# Patient Record
Sex: Male | Born: 1940 | Race: Black or African American | Hispanic: No | Marital: Married | Smoking: Former smoker
Health system: Southern US, Community
[De-identification: ages and names within clinical notes are randomized; demographics above are authoritative.]

## PROBLEM LIST (undated history)

## (undated) DIAGNOSIS — I10 Essential (primary) hypertension: Secondary | ICD-10-CM

## (undated) DIAGNOSIS — Z992 Dependence on renal dialysis: Secondary | ICD-10-CM

## (undated) DIAGNOSIS — J189 Pneumonia, unspecified organism: Secondary | ICD-10-CM

## (undated) DIAGNOSIS — N186 End stage renal disease: Secondary | ICD-10-CM

## (undated) DIAGNOSIS — T7840XA Allergy, unspecified, initial encounter: Secondary | ICD-10-CM

## (undated) DIAGNOSIS — R7989 Other specified abnormal findings of blood chemistry: Secondary | ICD-10-CM

## (undated) DIAGNOSIS — K449 Diaphragmatic hernia without obstruction or gangrene: Secondary | ICD-10-CM

## (undated) DIAGNOSIS — H269 Unspecified cataract: Secondary | ICD-10-CM

## (undated) DIAGNOSIS — J302 Other seasonal allergic rhinitis: Secondary | ICD-10-CM

## (undated) DIAGNOSIS — M1990 Unspecified osteoarthritis, unspecified site: Secondary | ICD-10-CM

## (undated) DIAGNOSIS — K219 Gastro-esophageal reflux disease without esophagitis: Secondary | ICD-10-CM

## (undated) DIAGNOSIS — I2510 Atherosclerotic heart disease of native coronary artery without angina pectoris: Secondary | ICD-10-CM

## (undated) DIAGNOSIS — N529 Male erectile dysfunction, unspecified: Secondary | ICD-10-CM

## (undated) DIAGNOSIS — E039 Hypothyroidism, unspecified: Secondary | ICD-10-CM

## (undated) DIAGNOSIS — I739 Peripheral vascular disease, unspecified: Secondary | ICD-10-CM

## (undated) DIAGNOSIS — G4730 Sleep apnea, unspecified: Secondary | ICD-10-CM

## (undated) DIAGNOSIS — N289 Disorder of kidney and ureter, unspecified: Secondary | ICD-10-CM

## (undated) DIAGNOSIS — D649 Anemia, unspecified: Secondary | ICD-10-CM

## (undated) DIAGNOSIS — E785 Hyperlipidemia, unspecified: Secondary | ICD-10-CM

## (undated) DIAGNOSIS — E7211 Homocystinuria: Secondary | ICD-10-CM

## (undated) DIAGNOSIS — K5792 Diverticulitis of intestine, part unspecified, without perforation or abscess without bleeding: Secondary | ICD-10-CM

## (undated) DIAGNOSIS — F0390 Unspecified dementia without behavioral disturbance: Secondary | ICD-10-CM

## (undated) DIAGNOSIS — M109 Gout, unspecified: Secondary | ICD-10-CM

## (undated) HISTORY — DX: Peripheral vascular disease, unspecified: I73.9

## (undated) HISTORY — DX: Disorder of kidney and ureter, unspecified: N28.9

## (undated) HISTORY — DX: Allergy, unspecified, initial encounter: T78.40XA

## (undated) HISTORY — DX: Essential (primary) hypertension: I10

## (undated) HISTORY — PX: OTHER SURGICAL HISTORY: SHX169

## (undated) HISTORY — DX: Other specified abnormal findings of blood chemistry: R79.89

## (undated) HISTORY — PX: BREAST SURGERY: SHX581

## (undated) HISTORY — DX: Unspecified cataract: H26.9

## (undated) HISTORY — DX: Homocystinuria: E72.11

## (undated) HISTORY — PX: COLONOSCOPY: SHX174

## (undated) HISTORY — DX: Male erectile dysfunction, unspecified: N52.9

## (undated) HISTORY — DX: Hyperlipidemia, unspecified: E78.5

## (undated) HISTORY — PX: EYE SURGERY: SHX253

---

## 1990-09-24 HISTORY — PX: 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 (ARMC HX): HXRAD1269

## 1999-05-03 ENCOUNTER — Ambulatory Visit (HOSPITAL_COMMUNITY): Admission: RE | Admit: 1999-05-03 | Discharge: 1999-05-03 | Payer: Self-pay | Admitting: Nephrology

## 1999-05-03 ENCOUNTER — Encounter: Payer: Self-pay | Admitting: Nephrology

## 2003-06-08 ENCOUNTER — Encounter: Payer: Self-pay | Admitting: Gastroenterology

## 2003-06-08 DIAGNOSIS — K5730 Diverticulosis of large intestine without perforation or abscess without bleeding: Secondary | ICD-10-CM | POA: Insufficient documentation

## 2003-12-06 ENCOUNTER — Ambulatory Visit (HOSPITAL_COMMUNITY): Admission: RE | Admit: 2003-12-06 | Discharge: 2003-12-06 | Payer: Self-pay | Admitting: Nephrology

## 2003-12-07 ENCOUNTER — Ambulatory Visit (HOSPITAL_COMMUNITY): Admission: RE | Admit: 2003-12-07 | Discharge: 2003-12-07 | Payer: Self-pay | Admitting: Nephrology

## 2004-12-04 ENCOUNTER — Encounter: Admission: RE | Admit: 2004-12-04 | Discharge: 2005-03-04 | Payer: Self-pay | Admitting: Internal Medicine

## 2005-04-11 ENCOUNTER — Encounter: Admission: RE | Admit: 2005-04-11 | Discharge: 2005-07-10 | Payer: Self-pay | Admitting: Internal Medicine

## 2005-07-11 ENCOUNTER — Encounter: Admission: RE | Admit: 2005-07-11 | Discharge: 2005-10-09 | Payer: Self-pay | Admitting: Internal Medicine

## 2007-01-27 ENCOUNTER — Emergency Department (HOSPITAL_COMMUNITY): Admission: EM | Admit: 2007-01-27 | Discharge: 2007-01-27 | Payer: Self-pay | Admitting: Emergency Medicine

## 2007-11-24 ENCOUNTER — Emergency Department (HOSPITAL_COMMUNITY): Admission: EM | Admit: 2007-11-24 | Discharge: 2007-11-24 | Payer: Self-pay | Admitting: Emergency Medicine

## 2008-07-19 ENCOUNTER — Ambulatory Visit: Payer: Self-pay | Admitting: Internal Medicine

## 2008-08-30 DIAGNOSIS — Z8719 Personal history of other diseases of the digestive system: Secondary | ICD-10-CM | POA: Insufficient documentation

## 2008-08-30 DIAGNOSIS — I10 Essential (primary) hypertension: Secondary | ICD-10-CM | POA: Insufficient documentation

## 2008-08-30 DIAGNOSIS — Z87448 Personal history of other diseases of urinary system: Secondary | ICD-10-CM | POA: Insufficient documentation

## 2008-09-03 ENCOUNTER — Ambulatory Visit: Payer: Self-pay | Admitting: Gastroenterology

## 2008-09-03 DIAGNOSIS — Z794 Long term (current) use of insulin: Secondary | ICD-10-CM

## 2008-09-03 DIAGNOSIS — I2510 Atherosclerotic heart disease of native coronary artery without angina pectoris: Secondary | ICD-10-CM | POA: Insufficient documentation

## 2008-09-03 DIAGNOSIS — IMO0001 Reserved for inherently not codable concepts without codable children: Secondary | ICD-10-CM | POA: Insufficient documentation

## 2008-09-03 DIAGNOSIS — E119 Type 2 diabetes mellitus without complications: Secondary | ICD-10-CM | POA: Insufficient documentation

## 2008-09-03 DIAGNOSIS — E785 Hyperlipidemia, unspecified: Secondary | ICD-10-CM | POA: Insufficient documentation

## 2008-10-28 ENCOUNTER — Ambulatory Visit: Payer: Self-pay | Admitting: Gastroenterology

## 2009-01-24 ENCOUNTER — Ambulatory Visit: Payer: Self-pay | Admitting: Gastroenterology

## 2009-01-24 ENCOUNTER — Encounter (INDEPENDENT_AMBULATORY_CARE_PROVIDER_SITE_OTHER): Payer: Self-pay | Admitting: *Deleted

## 2009-10-10 ENCOUNTER — Ambulatory Visit: Payer: Self-pay | Admitting: Internal Medicine

## 2010-04-17 ENCOUNTER — Ambulatory Visit: Payer: Self-pay | Admitting: Internal Medicine

## 2010-10-24 ENCOUNTER — Ambulatory Visit
Admission: RE | Admit: 2010-10-24 | Discharge: 2010-10-24 | Payer: Self-pay | Source: Home / Self Care | Attending: Internal Medicine | Admitting: Internal Medicine

## 2010-10-26 NOTE — Assessment & Plan Note (Signed)
Summary: RECHECK DIVERTICULITIES.Marland KitchenEM   History of Present Illness Visit Type: consult Primary GI MD: Erskine Emery MD [facility] Primary Provider: Tedra Senegal, MD Requesting Provider: Tedra Senegal, MD Chief Complaint: LLQ abd discomfort-pain that radiates to RLQ before a BM. History of Present Illness:   [name] is a pleasant 70 year old African American male referred at the request of  Emeline General, MD for evaluation of abdominal pain.  He complains of intermittent left lower quadrant pain that often precedes a bowel movement.  It is relieved by bowel movement.  He denies change in bowel habits, melena or hematochezia.  Approximately one year ago he was evaluated in the ER for similar pain was told to have diverticulitis and was treated with antibiotics.  He has episodes every couple of weeks.    GI Review of Systems    Reports abdominal pain.     Location of  Abdominal pain: LLQ.    Denies acid reflux, belching, bloating, chest pain, dysphagia with liquids, dysphagia with solids, heartburn, loss of appetite, nausea, vomiting, vomiting blood, weight loss, and  weight gain.      Reports diverticulosis.     Denies anal fissure, black tarry stools, change in bowel habit, constipation, diarrhea, fecal incontinence, heme positive stool, hemorrhoids, irritable bowel syndrome, jaundice, light color stool, liver problems, rectal bleeding, and  rectal pain.     Prior Medications Reviewed Using: Medication Bottles  Updated Prior Medication List: GLIPIZIDE 10 MG TABS (GLIPIZIDE) one tablet by mouth two times a day ALLOPURINOL 100 MG TABS (ALLOPURINOL) one tablet by mouth once daily [employer] STOOL SOFTENER 100 MG CAPS (DOCUSATE SODIUM) as needed ASPIRIN 325 MG  TABS (ASPIRIN) one tablet by mouth once daily CALCITRIOL 0.25 MCG CAPS (CALCITRIOL) four capsules by mouth once daily LABETALOL HCL 200 MG TABS (LABETALOL HCL) 2 tablets by mouth two times a day SIMVASTATIN 80 MG TABS (SIMVASTATIN) 1 1/2  tablets by mouth at bedtime NIFEDIPINE 60 MG XR24H-TAB (NIFEDIPINE) one tablet by mouth once daily ACTOS 45 MG TABS (PIOGLITAZONE HCL) one tablet by mouth once daily FUROSEMIDE 80 MG TABS (FUROSEMIDE) 2 tablets by mouth in the morning and one tabelt by mouth in the evening. HUMALOG PEN 100 UNIT/ML SOLN (INSULIN LISPRO (HUMAN)) sliding scale  Current Allergies (reviewed today): ! PENICILLIN  Past Medical History:    Current Problems:     CAD (ICD-414.00)    BOWEL OBSTRUCTION (ICD-560.9)    DIABETES MELLITUS (ICD-250.00)    HYPERLIPIDEMIA (ICD-272.4)    HYPERTENSION (ICD-401.9)    PANCREATITIS, HX OF (ICD-V12.70) - this occurred over 25 years ago at time of heavy alcohol use    RENAL FAILURE, ACUTE, HX OF (ICD-V13.09)    DIVERTICULOSIS, COLON (ICD-562.10)       Past Surgical History:    Reviewed history from 08/30/2008 and no changes required:       Aortic Valve Replacement       Laparotomy for small bowel obstruction.  This was complicated by wound infection   Family History:    Family History of Diabetes: Father, Grandfather    Family History of Kidney Disease: Father  Social History:    Married    Retired    Patient is a former smoker.     Alcohol Use - no    Daily Caffeine Use    Illicit Drug Use - no   Risk Factors:  Tobacco use:  quit Drug use:  no Alcohol use:  no   Review of Systems  The patient denies  allergy/sinus, anemia, anxiety-new, arthritis/joint pain, back pain, blood in urine, breast changes/lumps, change in vision, confusion, cough, coughing up blood, depression-new, fainting, fatigue, fever, headaches-new, hearing problems, heart murmur, heart rhythm changes, itching, menstrual pain, muscle pains/cramps, night sweats, nosebleeds, pregnancy symptoms, shortness of breath, skin rash, sleeping problems, sore throat, swelling of feet/legs, swollen lymph glands, thirst - excessive , urination - excessive , urination changes/pain, urine leakage, vision  changes, and voice change.     Vital Signs:  Patient Profile:   71 Years Old Male Height:     71 inches Weight:      235.25 pounds BMI:     32.93 Pulse rate:   76 / minute Pulse rhythm:   regular BP sitting:   122 / 70  (left arm) Cuff size:   regular  Vitals Entered By: Marlon Pel CMA (September 03, 2008 8:34 AM)                  Physical Exam  is a healthy-appearing male  skin: anicteric HEENT: normocephalic; PEERLA; no nasal or pharyngeal abnormalities neck: supple nodes: no cervical lymphadenopathy chest: clear to ausculatation and percussion heart: no murmurs, gallops, or rubs abd: soft, nontender; BS normoactive; no abdominal masses, tenderness, organomegaly; he has a heavily scarred abdomen related to his previous surgery rectal: deferred ext: no cynanosis, clubbing, edema skeletal: no deformities neuro: oriented x 3; no focal abnormalities     Impression & Recommendations:  Problem # 1:  ABDOMINAL PAIN, LEFT LOWER QUADRANT (ICD-789.04) His pain may be related to colonic spasm, perhaps secondary to narrowing related to his diverticular disease.  It is unlikely due to an acute intra-abdominal process such as acute diverticulitis.  A fixed stricture of the colon neoplasm or other less likely considerations.  Recommendations #1 colonoscopy #2 hyomax 0.25 mg sublingual p.r.n. Orders: Colonoscopy (Colon)    Patient Instructions: 1)  Colonoscopy and Flexible Sigmoidoscopy brochure given. 2)  Conscious Sedation brochure given. 3)  Your procedure is scheduled for 10/05/2008 in [place] 4)  You Miralax prep is being sent to your pharmacy today 5)  cc Emeline General, MD    Prescriptions: HYOMAX-SL 0.125 MG SUBL (HYOSCYAMINE SULFATE) 2 tabs sublingual every 4 hours as needed  #15 x 2   Entered and Authorized by:   Inda Castle MD   Signed by:   Inda Castle MD on 09/03/2008   Method used:   Electronically to        [id number] (retail)       [address]       [place], [place]  [address]       Ph: [phone] or [phone]       Fax: [phone]   RxID:   [id number] DULCOLAX 5 MG  TBEC (BISACODYL) Day before procedure take 2 at 3pm and 2 at 8pm.  #4 x 0   Entered by:   Genella Mech CMA   Authorized by:   Inda Castle MD   Signed by:   Genella Mech CMA on 09/03/2008   Method used:   Electronically to        [id number] (retail)       [address]       [place],   [address]       Ph: [phone]  or [phone]       Fax: [phone]   RxID:   [id number] REGLAN 10 MG  TABS (METOCLOPRAMIDE HCL) As per prep instructions.  #2 x 0   Entered by:   Genella Mech CMA   Authorized by:   Inda Castle MD   Signed by:   Genella Mech CMA on 09/03/2008   Method used:   Electronically to        [id number] (retail)       [address]       [place], [place]  [address]       Ph: [phone] or [phone]       Fax: [phone]   RxID:   [id number] MIRALAX   POWD (POLYETHYLENE GLYCOL 3350) As per prep  instructions.  #255gm x 0   Entered by:   Genella Mech CMA   Authorized by:   Inda Castle MD   Signed by:   Genella Mech CMA on 09/03/2008   Method used:   Electronically to        [id number] (retail)       [address]       [place], [place]  [address]       Ph: [phone] or [phone]       Fax: [phone]   RxID:   [id number]  ]

## 2010-10-26 NOTE — Letter (Signed)
Summary: Results Letter  [facility]  [place], [place] [address]   Phone: [phone]  Fax: [phone]        September 03, 2008 MRN: [id number]    [name] [place], [place]  [address]    Dear Mr. Mroz,  It is my pleasure to have treated you recently as a new patient in my office. I appreciate your confidence and the opportunity to participate in your care.  Since I do have a busy inpatient endoscopy schedule and office schedule, my office hours vary weekly. I am, however, available for emergency calls everyday through my office. If I am not available for an urgent office appointment, another one of our gastroenterologist will be able to assist you.  My well-trained staff are prepared to help you at all times. For emergencies after office hours, a physician from our Gastroenterology section is always available through my 24 hour answering service  Once again I welcome you as a new patient and I look forward to a happy and healthy relationship             Sincerely,  Inda Castle MD  This letter has been electronically signed by your physician.  Appended Document: Results Letter letter mailed

## 2010-10-26 NOTE — Assessment & Plan Note (Signed)
Summary: Colon f-up/yf   History of Present Illness Visit Type: Follow-up Visit Primary GI MD: Erskine Emery MD [facility] Primary Provider: Tedra Senegal, MD Requesting Provider: Tedra Senegal, MD Chief Complaint: f/u Colon  [name] has returned following his colonoscopy.  Scattered diverticula were seen.  His only complaint is occasional lower abdominal pain which is reduced d with hyoscyamine.  Other medical problems including his coronary artery disease are stable.   GI Review of Systems    Reports abdominal pain.     Location of  Abdominal pain: lower abdomen.    Denies acid reflux, belching, bloating, chest pain, dysphagia with liquids, dysphagia with solids, heartburn, loss of appetite, nausea, vomiting, vomiting blood, weight loss, and  weight gain.        Denies anal fissure, black tarry stools, change in bowel habit, constipation, diarrhea, diverticulosis, fecal incontinence, heme positive stool, hemorrhoids, irritable bowel syndrome, jaundice, light color stool, liver problems, rectal bleeding, and  rectal pain.    Current Medications (verified): 1)  Glipizide 10 Mg Tabs (Glipizide) .... One Tablet By Mouth Two Times A Day 2)  Allopurinol 100 Mg Tabs (Allopurinol) .... One Tablet By Mouth Once Daily 3)  Cvs Stool Softener 100 Mg Caps (Docusate Sodium) .... As Needed 4)  Aspirin 325 Mg  Tabs (Aspirin) .... One Tablet By Mouth Once Daily 5)  Calcitriol 0.25 Mcg Caps (Calcitriol) .... Four Capsules By Mouth Once Daily 6)  Labetalol Hcl 200 Mg Tabs (Labetalol Hcl) .... 2 Tablets By Mouth Two Times A Day 7)  Simvastatin 80 Mg Tabs (Simvastatin) .Marland Kitchen.. 1 1/2 Tablets By Mouth At Bedtime 8)  Nifedipine 60 Mg Xr24h-Tab (Nifedipine) .... One Tablet By Mouth Once Daily 9)  Actos 45 Mg Tabs (Pioglitazone Hcl) .... One Tablet By Mouth Once Daily 10)  Furosemide 80 Mg Tabs (Furosemide) .... 2 Tablets By Mouth in The Morning and One Tabelt By Mouth in The Evening. 11)  Humalog Pen 100 Unit/ml Soln  (Insulin Lispro (Human)) .... Sliding Scale 12)  Hyomax-Sl 0.125 Mg Subl (Hyoscyamine Sulfate) .... 2 Tabs Sublingual Every 4 Hours As Needed  Allergies (verified): 1)  ! Penicillin  Past History:  Past Medical History:    Reviewed history from 09/03/2008 and no changes required:    Current Problems:     CAD (ICD-414.00)    BOWEL OBSTRUCTION (ICD-560.9)    DIABETES MELLITUS (ICD-250.00)    HYPERLIPIDEMIA (ICD-272.4)    HYPERTENSION (ICD-401.9)    PANCREATITIS, HX OF (ICD-V12.70) - this occurred over 25 years ago at time of heavy alcohol use    RENAL FAILURE, ACUTE, HX OF (ICD-V13.09)    DIVERTICULOSIS, COLON (ICD-562.10)       Past Surgical History:    Reviewed history from 09/03/2008 and no changes required:    Aortic Valve Replacement    Laparotomy for small bowel obstruction.  This was complicated by wound infection  Family History:    Reviewed history from 09/03/2008 and no changes required:       Family History of Diabetes: Father, Grandfather       Family History of Kidney Disease: Father  Social History:    Reviewed history from 09/03/2008 and no changes required:       Married       Retired       Patient is a former smoker.        Alcohol Use - no       Daily Caffeine Use       Illicit Drug  Use - no  Review of Systems  The patient denies allergy/sinus, anemia, anxiety-new, arthritis/joint pain, back pain, blood in urine, breast changes/lumps, change in vision, confusion, cough, coughing up blood, depression-new, fainting, fatigue, fever, headaches-new, hearing problems, heart murmur, heart rhythm changes, itching, menstrual pain, muscle pains/cramps, night sweats, nosebleeds, pregnancy symptoms, shortness of breath, skin rash, sleeping problems, sore throat, swelling of feet/legs, swollen lymph glands, thirst - excessive , urination - excessive , urination changes/pain, urine leakage, vision changes, and voice change.    Vital Signs:  Patient profile:   70 year  old male Height:      71 inches Weight:      234.25 pounds BMI:     32.79 Pulse rate:   78 / minute Pulse rhythm:   regular BP sitting:   140 / 52  (left arm)  Vitals Entered By: Christian Mate CMA (Jan 24, 2009 11:11 AM)   Impression & Recommendations:  Problem # 1:  ABDOMINAL PAIN, LEFT LOWER QUADRANT (ICD-789.04) Assessment Improved This is likely related to colonic spasm, perhaps worsened by his diverticulosis.  He has responded well to anticholinergic therapy and we will continue with the same.  Patient Instructions: 1)  cc Emeline General, MD

## 2010-10-26 NOTE — Procedures (Signed)
Summary: Colonoscopy   Colonoscopy  Procedure date:  10/28/2008  Findings:      Location:  [facility].    Procedures Next Due Date:    Colonoscopy: 10/2018  COLONOSCOPY PROCEDURE REPORT  PATIENT:  [name], [name]  MR#:  [id number] BIRTHDATE:   01/10/1941   GENDER:   male  ENDOSCOPIST:   Sandy Salaam. Deatra Ina, MD Referred by: Emeline General, M.D.  PROCEDURE DATE:  10/28/2008 PROCEDURE:  Colonoscopy, diagnostic ASA CLASS:   Class II INDICATIONS: abdominal pain   MEDICATIONS:    Fentanyl 100 mcg, Versed 10 mg, Benadryl 25  DESCRIPTION OF PROCEDURE:   After the risks benefits and alternatives of the procedure were thoroughly explained, informed consent was obtained.  Digital rectal exam was performed and revealed no abnormalities.   The LB [id number] endoscope was introduced through the anus and advanced to the cecum, which was identified by both the appendix and ileocecal valve, without limitations.  The quality of the prep was excellent, using MiraLax.  The instrument was then slowly withdrawn as the colon was fully examined. <<PROCEDUREIMAGES>>            <<OLD IMAGES>>  FINDINGS:  Scattered diverticula were found in the right colon (see image3).  Moderate diverticulosis was found in the sigmoid colon (see image6).  This was otherwise a normal examination (see image1, image2, image4, image7, and image8).   Retroflexed views in the rectum revealed no abnormalities.    The scope was then withdrawn from the patient and the procedure completed.  COMPLICATIONS:   None  ENDOSCOPIC IMPRESSION:  1) Diverticula, scattered in the right colon  2) Moderate diverticulosis in the sigmoid colon  3) Otherwise normal examination RECOMMENDATIONS:  1) continue current medications  2) follow-up: office 6 week(s)  REPEAT EXAM:   In 10 year(s) for Colonoscopy.   _______________________________ Sandy Salaam. Deatra Ina, MD  CC: Emeline General, MD

## 2010-10-26 NOTE — Assessment & Plan Note (Signed)
  Nurse Visit    Preventive Screening-Counseling & Management  Comments: I spoke with [name] today regarding the [place] Aspirin Study. Subject was excluded because he takes two 325 mg of Apirin a day. Patients history of Renal Failure is also an exclusion.    Allergies: 1)  ! Penicillin

## 2011-01-09 LAB — GLUCOSE, CAPILLARY
Glucose-Capillary: 165 mg/dL — ABNORMAL HIGH (ref 70–99)
Glucose-Capillary: 165 mg/dL — ABNORMAL HIGH (ref 70–99)

## 2011-04-18 ENCOUNTER — Encounter: Payer: Self-pay | Admitting: Internal Medicine

## 2011-04-23 ENCOUNTER — Other Ambulatory Visit: Payer: Medicare Other | Admitting: Internal Medicine

## 2011-04-23 DIAGNOSIS — E119 Type 2 diabetes mellitus without complications: Secondary | ICD-10-CM

## 2011-04-23 DIAGNOSIS — E785 Hyperlipidemia, unspecified: Secondary | ICD-10-CM

## 2011-04-23 LAB — HEPATIC FUNCTION PANEL
ALT: 11 U/L (ref 0–53)
AST: 21 U/L (ref 0–37)
Albumin: 3.8 g/dL (ref 3.5–5.2)
Alkaline Phosphatase: 54 U/L (ref 39–117)
Bilirubin, Direct: 0.1 mg/dL (ref 0.0–0.3)
Indirect Bilirubin: 0.4 mg/dL (ref 0.0–0.9)
Total Bilirubin: 0.5 mg/dL (ref 0.3–1.2)
Total Protein: 6.6 g/dL (ref 6.0–8.3)

## 2011-04-23 LAB — LIPID PANEL
Cholesterol: 141 mg/dL (ref 0–200)
HDL: 35 mg/dL — ABNORMAL LOW (ref >39)
LDL Cholesterol: 90 mg/dL (ref 0–99)
Total CHOL/HDL Ratio: 4 Ratio
Triglycerides: 81 mg/dL (ref <150)
VLDL: 16 mg/dL (ref 0–40)

## 2011-04-24 ENCOUNTER — Encounter: Payer: Self-pay | Admitting: Internal Medicine

## 2011-04-24 ENCOUNTER — Ambulatory Visit (INDEPENDENT_AMBULATORY_CARE_PROVIDER_SITE_OTHER): Payer: Medicare Other | Admitting: Internal Medicine

## 2011-04-24 VITALS — BP 116/62 | HR 62 | Temp 97.6°F | Ht 70.0 in | Wt 236.0 lb

## 2011-04-24 DIAGNOSIS — E119 Type 2 diabetes mellitus without complications: Secondary | ICD-10-CM

## 2011-04-24 LAB — HEMOGLOBIN A1C
Hgb A1c MFr Bld: 7 % — ABNORMAL HIGH (ref <5.7)
Mean Plasma Glucose: 154 mg/dL — ABNORMAL HIGH (ref <117)

## 2011-04-24 NOTE — Progress Notes (Signed)
  Subjective:    Patient ID: [name], male    DOB: [birth year], 70 y.o.   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 He is followed by Dr. Lorrene Reid, nephrologist. He also is followed at the [facility].  Nephrology notes indicate that he has been followed by nephrologist since 1992. Baseline creatinine is running around 2.0. Creatinine has run as run as high as 2.45 around July 2011 but came back down to 1.92. Sees Dr. Karsten Ro, urologist for erectile dysfunction who also checks his PSA. He works with Probation officer. He traveled to [place] in the spring with his daughter and wife and had a great time. Ophthalmologist at [facility] checks him at least annually. Urine sent for microalbumin today. Hemoglobin A1c is 7%. Lipid panel/ liver functions are within normal limits He admits he doesn't always follow a strict diet. He is compliant with his medications.  He had a Cardiolite study done by Dr. Pernell Dupre in 2003 that was negative for ischemia  Occasionally has some left-sided abdominal pain which I've treated with Cipro and Flagyl once thinking it was diverticulitis. Seems to respond to antibiotics and happens infrequently.. Small bowel obstruction treated with surgical lysis of adhesions in 1996. 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 History of shingles 2007. Had Pneumovax immunization January 2011. Tetanus immunization June 2004.  In addition to Lantus insulin 15 units at bedtime, he is taking generic Actos 45 mg daily and Glucotrol XL 10 mg twice daily. He takes allopurinol  100 mg daily , takes aspirin 325 mg 2 tablets daily, nifedipine 60 mg daily, Lasix 80 mg 2 in the morning and one in the evening. Takes calcitriol  0.25 mcg tabs daily. Labetalol 200 mg 2 tabs twice daily. Generic Zocor 80 mg one half tab at bedtime. Levothyroxin 0.025 mg daily   Nephrologist checked  his intact PTH January 2012 in level was normal at 45. Serum creatinine in January 2012 was 2.28. Serum uric acid in January 2012 was 7.5, phosphorus was 3.6. In January 2011 hemoglobin A1c was 7.5% but he admitted to not eating correctly during the holidays. I have asked him to try to keep his hemoglobin A1c under 7%. The best he's ever had here was 6.7% 2008. The worst I've seen is 7.6% in April 2009    Review of Systems no complaints     Objective:   Physical Exam chest clear to auscultation; cardiac exam: Regular rate and rhythm normal S1 and S2; extremities: Trace lower Jevity edema        Assessment & Plan:   I am satisfied with his lipid control at this point in time. Would like to see a bit of improvement in diabetic control. Return in 6 months for physical examination and fasting lab work. He will be going to the [facility] soon. He gets his medications there. He sees ophthalmologist there. He gets influenza immunization there.

## 2011-04-25 ENCOUNTER — Encounter: Payer: Self-pay | Admitting: Internal Medicine

## 2011-04-25 LAB — MICROALBUMIN, URINE: Microalb, Ur: 31.82 mg/dL — ABNORMAL HIGH (ref 0.00–1.89)

## 2011-07-16 ENCOUNTER — Encounter: Payer: Self-pay | Admitting: Internal Medicine

## 2011-07-16 ENCOUNTER — Ambulatory Visit (INDEPENDENT_AMBULATORY_CARE_PROVIDER_SITE_OTHER): Payer: Medicare Other | Admitting: Internal Medicine

## 2011-07-16 DIAGNOSIS — J069 Acute upper respiratory infection, unspecified: Secondary | ICD-10-CM

## 2011-07-16 DIAGNOSIS — E119 Type 2 diabetes mellitus without complications: Secondary | ICD-10-CM

## 2011-07-16 LAB — HEMOGLOBIN A1C
Hgb A1c MFr Bld: 6.9 % — ABNORMAL HIGH (ref <5.7)
Mean Plasma Glucose: 151 mg/dL — ABNORMAL HIGH (ref <117)

## 2011-07-16 NOTE — Progress Notes (Signed)
  Subjective:    Patient ID: [name], male    DOB: 21-Oct-1940, 70 y.o.   MRN: [id number]  HPI 70 year old white male with history of diabetes mellitus and chronic kidney disease followed by Dr. Jamal Maes in today complaining of sore throat and sinus congestion. Recently went to [facility] hospital and had some lab work done but he does not have the results back. Have asked him to let me see those results. Says that for the past several weeks, he's been following a much stricter diet and feels that his hemoglobin A1c has improved. He's lost a few pounds. Hemoglobin A1c was drawn today. Patient does not have fever or shaking chills. Just complains of right-sided sore throat and nasal congestion.    Review of Systems     Objective:   Physical Exam pharynx is red without exudate; TMs are clear; anterior cervical nodes bilaterally; neck is supple chest is clear        Assessment & Plan:  URI  Diabetes mellitus  Chronic kidney disease  Plan: Patient says he received influenza vaccine at the [facility] just recently. Prescribed today Zithromax Z-Pak 2 tabs by mouth day one followed by 1 tab by mouth days 2 through 5 with no refill. Hemoglobin A1c is pending

## 2011-07-17 ENCOUNTER — Encounter: Payer: Self-pay | Admitting: Internal Medicine

## 2011-10-23 ENCOUNTER — Other Ambulatory Visit: Payer: Medicare Other | Admitting: Internal Medicine

## 2011-10-25 ENCOUNTER — Encounter: Payer: Medicare Other | Admitting: Internal Medicine

## 2011-11-13 DIAGNOSIS — I10 Essential (primary) hypertension: Secondary | ICD-10-CM | POA: Diagnosis not present

## 2011-11-13 DIAGNOSIS — N2581 Secondary hyperparathyroidism of renal origin: Secondary | ICD-10-CM | POA: Diagnosis not present

## 2011-11-13 DIAGNOSIS — N184 Chronic kidney disease, stage 4 (severe): Secondary | ICD-10-CM | POA: Diagnosis not present

## 2011-11-13 DIAGNOSIS — E119 Type 2 diabetes mellitus without complications: Secondary | ICD-10-CM | POA: Diagnosis not present

## 2011-11-13 DIAGNOSIS — I129 Hypertensive chronic kidney disease with stage 1 through stage 4 chronic kidney disease, or unspecified chronic kidney disease: Secondary | ICD-10-CM | POA: Diagnosis not present

## 2011-11-13 DIAGNOSIS — E559 Vitamin D deficiency, unspecified: Secondary | ICD-10-CM | POA: Diagnosis not present

## 2011-11-16 ENCOUNTER — Encounter: Payer: Self-pay | Admitting: Internal Medicine

## 2011-11-19 ENCOUNTER — Other Ambulatory Visit: Payer: Medicare Other | Admitting: Internal Medicine

## 2011-11-20 ENCOUNTER — Encounter: Payer: Medicare Other | Admitting: Internal Medicine

## 2011-11-22 ENCOUNTER — Other Ambulatory Visit: Payer: Medicare Other | Admitting: Internal Medicine

## 2011-11-22 DIAGNOSIS — I10 Essential (primary) hypertension: Secondary | ICD-10-CM

## 2011-11-22 DIAGNOSIS — Z125 Encounter for screening for malignant neoplasm of prostate: Secondary | ICD-10-CM

## 2011-11-22 DIAGNOSIS — E785 Hyperlipidemia, unspecified: Secondary | ICD-10-CM

## 2011-11-22 DIAGNOSIS — E119 Type 2 diabetes mellitus without complications: Secondary | ICD-10-CM | POA: Diagnosis not present

## 2011-11-22 LAB — CBC WITH DIFFERENTIAL/PLATELET
Basophils Absolute: 0.1 10*3/uL (ref 0.0–0.1)
Basophils Relative: 1 % (ref 0–1)
Eosinophils Absolute: 0.3 10*3/uL (ref 0.0–0.7)
Eosinophils Relative: 5 % (ref 0–5)
HCT: 43.7 % (ref 39.0–52.0)
Hemoglobin: 14 g/dL (ref 13.0–17.0)
Lymphocytes Relative: 39 % (ref 12–46)
Lymphs Abs: 2.8 10*3/uL (ref 0.7–4.0)
MCH: 32.3 pg (ref 26.0–34.0)
MCHC: 32 g/dL (ref 30.0–36.0)
MCV: 100.9 fL — ABNORMAL HIGH (ref 78.0–100.0)
Monocytes Absolute: 0.7 10*3/uL (ref 0.1–1.0)
Monocytes Relative: 9 % (ref 3–12)
Neutro Abs: 3.3 10*3/uL (ref 1.7–7.7)
Neutrophils Relative %: 46 % (ref 43–77)
Platelets: 137 10*3/uL — ABNORMAL LOW (ref 150–400)
RBC: 4.33 MIL/uL (ref 4.22–5.81)
RDW: 14.9 % (ref 11.5–15.5)
WBC: 7.2 10*3/uL (ref 4.0–10.5)

## 2011-11-22 LAB — LIPID PANEL
Cholesterol: 124 mg/dL (ref 0–200)
HDL: 38 mg/dL — ABNORMAL LOW (ref >39)
LDL Cholesterol: 75 mg/dL (ref 0–99)
Total CHOL/HDL Ratio: 3.3 Ratio
Triglycerides: 55 mg/dL (ref <150)
VLDL: 11 mg/dL (ref 0–40)

## 2011-11-22 LAB — COMPREHENSIVE METABOLIC PANEL
ALT: 13 U/L (ref 0–53)
AST: 23 U/L (ref 0–37)
Albumin: 4 g/dL (ref 3.5–5.2)
Alkaline Phosphatase: 55 U/L (ref 39–117)
BUN: 57 mg/dL — ABNORMAL HIGH (ref 6–23)
CO2: 27 mEq/L (ref 19–32)
Calcium: 10.2 mg/dL (ref 8.4–10.5)
Chloride: 104 mEq/L (ref 96–112)
Creat: 3 mg/dL — ABNORMAL HIGH (ref 0.50–1.35)
Glucose, Bld: 91 mg/dL (ref 70–99)
Potassium: 3.9 mEq/L (ref 3.5–5.3)
Sodium: 142 mEq/L (ref 135–145)
Total Bilirubin: 0.5 mg/dL (ref 0.3–1.2)
Total Protein: 6.2 g/dL (ref 6.0–8.3)

## 2011-11-22 LAB — HEMOGLOBIN A1C
Hgb A1c MFr Bld: 6.6 % — ABNORMAL HIGH (ref <5.7)
Mean Plasma Glucose: 143 mg/dL — ABNORMAL HIGH (ref <117)

## 2011-11-22 LAB — PSA, MEDICARE: PSA: 0.57 ng/mL (ref <=4.00)

## 2011-11-23 ENCOUNTER — Ambulatory Visit (INDEPENDENT_AMBULATORY_CARE_PROVIDER_SITE_OTHER): Payer: Medicare Other | Admitting: Internal Medicine

## 2011-11-23 ENCOUNTER — Encounter: Payer: Self-pay | Admitting: Internal Medicine

## 2011-11-23 VITALS — BP 106/56 | HR 64 | Temp 98.3°F | Ht 70.25 in | Wt 240.5 lb

## 2011-11-23 DIAGNOSIS — E213 Hyperparathyroidism, unspecified: Secondary | ICD-10-CM | POA: Diagnosis not present

## 2011-11-23 DIAGNOSIS — I739 Peripheral vascular disease, unspecified: Secondary | ICD-10-CM

## 2011-11-23 DIAGNOSIS — E039 Hypothyroidism, unspecified: Secondary | ICD-10-CM

## 2011-11-23 DIAGNOSIS — I10 Essential (primary) hypertension: Secondary | ICD-10-CM | POA: Diagnosis not present

## 2011-11-23 DIAGNOSIS — J309 Allergic rhinitis, unspecified: Secondary | ICD-10-CM

## 2011-11-23 DIAGNOSIS — N529 Male erectile dysfunction, unspecified: Secondary | ICD-10-CM

## 2011-11-23 DIAGNOSIS — N189 Chronic kidney disease, unspecified: Secondary | ICD-10-CM | POA: Diagnosis not present

## 2011-11-23 LAB — POCT URINALYSIS DIPSTICK
Bilirubin, UA: NEGATIVE
Blood, UA: NEGATIVE
Glucose, UA: NEGATIVE
Ketones, UA: NEGATIVE
Leukocytes, UA: NEGATIVE
Nitrite, UA: NEGATIVE
Spec Grav, UA: 1.025
Urobilinogen, UA: NEGATIVE
pH, UA: 6

## 2011-11-28 DIAGNOSIS — H25019 Cortical age-related cataract, unspecified eye: Secondary | ICD-10-CM | POA: Diagnosis not present

## 2011-11-28 DIAGNOSIS — E119 Type 2 diabetes mellitus without complications: Secondary | ICD-10-CM | POA: Diagnosis not present

## 2011-11-28 DIAGNOSIS — H2510 Age-related nuclear cataract, unspecified eye: Secondary | ICD-10-CM | POA: Diagnosis not present

## 2011-11-28 DIAGNOSIS — H25049 Posterior subcapsular polar age-related cataract, unspecified eye: Secondary | ICD-10-CM | POA: Diagnosis not present

## 2011-11-28 DIAGNOSIS — IMO0002 Reserved for concepts with insufficient information to code with codable children: Secondary | ICD-10-CM | POA: Diagnosis not present

## 2011-11-28 DIAGNOSIS — H2589 Other age-related cataract: Secondary | ICD-10-CM | POA: Diagnosis not present

## 2011-12-24 DIAGNOSIS — I739 Peripheral vascular disease, unspecified: Secondary | ICD-10-CM | POA: Insufficient documentation

## 2011-12-24 DIAGNOSIS — J309 Allergic rhinitis, unspecified: Secondary | ICD-10-CM | POA: Insufficient documentation

## 2011-12-24 DIAGNOSIS — E039 Hypothyroidism, unspecified: Secondary | ICD-10-CM | POA: Insufficient documentation

## 2011-12-24 DIAGNOSIS — N529 Male erectile dysfunction, unspecified: Secondary | ICD-10-CM | POA: Insufficient documentation

## 2011-12-24 DIAGNOSIS — E213 Hyperparathyroidism, unspecified: Secondary | ICD-10-CM | POA: Insufficient documentation

## 2011-12-24 NOTE — Progress Notes (Signed)
  Subjective:    Patient ID: [name], male    DOB: 1941-06-03, 71 y.o.   MRN: [id number]  HPI 71 year old black male with type 2 diabetes mellitus, hypertension, hyperparathyroidism related to chronic kidney disease, hyperlipidemia, erectile dysfunction, allergic rhinitis and hypothyroidism. He is followed by Dr. Jamal Maes, nephrologist and also at the [place] in [place]. Patient developed acute renal failure following an aorto bifemoral bypass for  aorto iliac occlusive disease. 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 Several months after his surgery a renal scan showed diminished flow and function to the left kidney with about 35% of his function coming from the left and 65% coming from the right. He maintains a creatinine between 1.4 1.6 over the past several years.  He is allergic to penicillin. History of aortobifem bypass 1992, history of small bowel obstruction with lysis of adhesions in 1996. 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 Had Pneumovax in 2002 and again in January 2011. Gets annual influenza immunization. Had tetanus immunization 03/16/2003. He is to have cataract extraction of his right eye in March. This will be done by Dr. Satira Sark. Diabetes is under fairly good control at the present time. He had a Cardiolite study by Dr. Pernell Dupre in 2003.  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 Wife is a retired Radio producer. Patient is also retired from [employer]. Does not smoke or consume alcohol. He quit smoking around the year 2000. He completed 4 years of college and is a native of [place]. Graduate of [employer].  Family history: Father died at age 22 of a heart attack. Mother died at age 69 of a brain aneurysm. Father also had history of stroke, diabetes, hypertension and gout.    Review of Systems feels well with no complaints today.  Has had episodes of diverticulitis in the past.     Objective:   Physical Exam TMs and pharynx are clear; neck is supple without JVD thyromegaly or carotid bruits; chest clear to auscultation; cardiac exam regular rate and rhythm normal S1 and S2; abdomen no hepatosplenomegaly masses or tenderness; prostate is normal; extremities without pitting edema; neuro no gross focal deficits on brief neurological exam        Assessment & Plan:  Hypertension  Chronic kidney disease  Type 2 diabetes mellitus  Hyperparathyroidism  History of peripheral vascular disease  Hyperlipidemia  Erectile dysfunction  Allergic rhinitis  Hypothyroidism  Plan: Patient is to return in 6 months for six-month followup. He will need fasting lipid panel, hemoglobin A1c, liver functions, TSH at that time.

## 2011-12-25 ENCOUNTER — Other Ambulatory Visit: Payer: Medicare Other | Admitting: Internal Medicine

## 2011-12-26 DIAGNOSIS — H2589 Other age-related cataract: Secondary | ICD-10-CM | POA: Diagnosis not present

## 2011-12-26 DIAGNOSIS — H2510 Age-related nuclear cataract, unspecified eye: Secondary | ICD-10-CM | POA: Diagnosis not present

## 2011-12-26 DIAGNOSIS — IMO0002 Reserved for concepts with insufficient information to code with codable children: Secondary | ICD-10-CM | POA: Diagnosis not present

## 2011-12-26 DIAGNOSIS — E119 Type 2 diabetes mellitus without complications: Secondary | ICD-10-CM | POA: Diagnosis not present

## 2011-12-26 DIAGNOSIS — H25019 Cortical age-related cataract, unspecified eye: Secondary | ICD-10-CM | POA: Diagnosis not present

## 2011-12-27 ENCOUNTER — Encounter: Payer: Medicare Other | Admitting: Internal Medicine

## 2012-05-28 DIAGNOSIS — I129 Hypertensive chronic kidney disease with stage 1 through stage 4 chronic kidney disease, or unspecified chronic kidney disease: Secondary | ICD-10-CM | POA: Diagnosis not present

## 2012-05-28 DIAGNOSIS — M109 Gout, unspecified: Secondary | ICD-10-CM | POA: Diagnosis not present

## 2012-05-28 DIAGNOSIS — I10 Essential (primary) hypertension: Secondary | ICD-10-CM | POA: Diagnosis not present

## 2012-05-28 DIAGNOSIS — N184 Chronic kidney disease, stage 4 (severe): Secondary | ICD-10-CM | POA: Diagnosis not present

## 2012-05-28 DIAGNOSIS — E119 Type 2 diabetes mellitus without complications: Secondary | ICD-10-CM | POA: Diagnosis not present

## 2012-05-28 DIAGNOSIS — E785 Hyperlipidemia, unspecified: Secondary | ICD-10-CM | POA: Diagnosis not present

## 2012-07-14 ENCOUNTER — Other Ambulatory Visit: Payer: Medicare Other | Admitting: Internal Medicine

## 2012-07-14 DIAGNOSIS — Z79899 Other long term (current) drug therapy: Secondary | ICD-10-CM

## 2012-07-14 DIAGNOSIS — E039 Hypothyroidism, unspecified: Secondary | ICD-10-CM

## 2012-07-14 DIAGNOSIS — E119 Type 2 diabetes mellitus without complications: Secondary | ICD-10-CM | POA: Diagnosis not present

## 2012-07-14 DIAGNOSIS — E785 Hyperlipidemia, unspecified: Secondary | ICD-10-CM

## 2012-07-14 LAB — LIPID PANEL
Cholesterol: 141 mg/dL (ref 0–200)
HDL: 35 mg/dL — ABNORMAL LOW (ref >39)
LDL Cholesterol: 95 mg/dL (ref 0–99)
Total CHOL/HDL Ratio: 4 Ratio
Triglycerides: 56 mg/dL (ref <150)
VLDL: 11 mg/dL (ref 0–40)

## 2012-07-14 LAB — HEPATIC FUNCTION PANEL
ALT: 9 U/L (ref 0–53)
AST: 17 U/L (ref 0–37)
Albumin: 3.7 g/dL (ref 3.5–5.2)
Alkaline Phosphatase: 54 U/L (ref 39–117)
Bilirubin, Direct: 0.1 mg/dL (ref 0.0–0.3)
Indirect Bilirubin: 0.4 mg/dL (ref 0.0–0.9)
Total Bilirubin: 0.5 mg/dL (ref 0.3–1.2)
Total Protein: 6.1 g/dL (ref 6.0–8.3)

## 2012-07-14 LAB — HEMOGLOBIN A1C
Hgb A1c MFr Bld: 7 % — ABNORMAL HIGH (ref <5.7)
Mean Plasma Glucose: 154 mg/dL — ABNORMAL HIGH (ref <117)

## 2012-07-14 LAB — TSH: TSH: 2.564 u[IU]/mL (ref 0.350–4.500)

## 2012-07-16 DIAGNOSIS — N138 Other obstructive and reflux uropathy: Secondary | ICD-10-CM | POA: Diagnosis not present

## 2012-07-16 DIAGNOSIS — N401 Enlarged prostate with lower urinary tract symptoms: Secondary | ICD-10-CM | POA: Diagnosis not present

## 2012-07-17 ENCOUNTER — Ambulatory Visit (INDEPENDENT_AMBULATORY_CARE_PROVIDER_SITE_OTHER): Payer: Medicare Other | Admitting: Internal Medicine

## 2012-07-17 ENCOUNTER — Encounter: Payer: Self-pay | Admitting: Internal Medicine

## 2012-07-17 VITALS — BP 124/60 | HR 64 | Temp 97.9°F | Wt 236.0 lb

## 2012-07-17 DIAGNOSIS — IMO0001 Reserved for inherently not codable concepts without codable children: Secondary | ICD-10-CM

## 2012-07-17 DIAGNOSIS — N400 Enlarged prostate without lower urinary tract symptoms: Secondary | ICD-10-CM | POA: Diagnosis not present

## 2012-07-17 DIAGNOSIS — Z794 Long term (current) use of insulin: Secondary | ICD-10-CM

## 2012-07-17 DIAGNOSIS — E785 Hyperlipidemia, unspecified: Secondary | ICD-10-CM

## 2012-07-17 DIAGNOSIS — E119 Type 2 diabetes mellitus without complications: Secondary | ICD-10-CM | POA: Diagnosis not present

## 2012-07-17 DIAGNOSIS — I10 Essential (primary) hypertension: Secondary | ICD-10-CM

## 2012-07-17 DIAGNOSIS — Z87891 Personal history of nicotine dependence: Secondary | ICD-10-CM

## 2012-07-17 DIAGNOSIS — N183 Chronic kidney disease, stage 3 unspecified: Secondary | ICD-10-CM

## 2012-07-17 DIAGNOSIS — E213 Hyperparathyroidism, unspecified: Secondary | ICD-10-CM

## 2012-07-17 DIAGNOSIS — Z23 Encounter for immunization: Secondary | ICD-10-CM | POA: Diagnosis not present

## 2012-07-17 DIAGNOSIS — Z862 Personal history of diseases of the blood and blood-forming organs and certain disorders involving the immune mechanism: Secondary | ICD-10-CM

## 2012-07-17 DIAGNOSIS — E039 Hypothyroidism, unspecified: Secondary | ICD-10-CM

## 2012-07-17 DIAGNOSIS — Z8739 Personal history of other diseases of the musculoskeletal system and connective tissue: Secondary | ICD-10-CM

## 2012-07-17 DIAGNOSIS — Z8679 Personal history of other diseases of the circulatory system: Secondary | ICD-10-CM

## 2012-07-22 DIAGNOSIS — N529 Male erectile dysfunction, unspecified: Secondary | ICD-10-CM | POA: Diagnosis not present

## 2012-07-22 DIAGNOSIS — N138 Other obstructive and reflux uropathy: Secondary | ICD-10-CM | POA: Diagnosis not present

## 2012-07-22 DIAGNOSIS — N401 Enlarged prostate with lower urinary tract symptoms: Secondary | ICD-10-CM | POA: Diagnosis not present

## 2012-07-22 DIAGNOSIS — R972 Elevated prostate specific antigen [PSA]: Secondary | ICD-10-CM | POA: Diagnosis not present

## 2012-10-28 DIAGNOSIS — R972 Elevated prostate specific antigen [PSA]: Secondary | ICD-10-CM | POA: Diagnosis not present

## 2012-11-22 DIAGNOSIS — N400 Enlarged prostate without lower urinary tract symptoms: Secondary | ICD-10-CM | POA: Insufficient documentation

## 2012-11-22 NOTE — Progress Notes (Signed)
  Subjective:    Patient ID: [name], male    DOB: 02-12-1941, 72 y.o.   MRN: [id number]  HPI pleasant 72 year old 63 male with history of stage III chronic kidney disease seen by Dr. Lorrene Reid. History of adult onset diabetes mellitus, hypertension, hyperparathyroidism, hyperlipidemia, erectile dysfunction, hypothyroidism. Creatinine ranges between 2.55 and 2.88. GFR estimated at 24 cc per minute. Has been followed by nephrology since 1992. Had ischemic ATN at that time. This was following aortobifemoral surgery. History of gout. History of diverticulitis.  Place had colonoscopy done by Dr. Deatra Ina 10/28/2008. History of BPH and bladder outlet obstruction seen by Dr. Karsten Ro  Patient smoked 2 packs daily for for 30 years but quit smoking 10 years ago.  He is allergic to penicillin.  Patient takes Lantus 15 units at bedtime plus oral agents. He gets medications from the [place] bypass surgery 1992 with resultant ATN, small bowel obstruction with lysis of adhesions 1996. Left mastectomy for granulomatous mastitis. Has annual eye exam in the [place].    Review of Systems     Objective:   Physical Exam skin warm and dry. Diabetic foot exam without ulcers or calluses. Pulses absent in posterior tibial bilaterally. Chest is clear to auscultation. Cardiac exam regular rate and rhythm normal S1 and S2. Extremities without edema. Neck is supple without JVD thyromegaly or carotid bruits.        Assessment & Plan:  History of peripheral vascular disease  History of stage III chronic kidney disease from ATN after surgery 1992  History of diabetes mellitus treated with oral agents and Lantus  Hypertension  Hyperparathyroidism  Hyperlipidemia  Erectile dysfunction  BPH  Hypothyroidism  History of smoking  Plan: Return in 6 months for physical exam. Continue to be followed by Dr. Lorrene Reid. See labs. Speak with patient about diet and exercise. Needs to watch his diet  a bit.

## 2012-11-22 NOTE — Patient Instructions (Addendum)
Continue same medications and return in 6 months for physical exam. Watch diet and try to exercise

## 2012-12-23 DIAGNOSIS — I10 Essential (primary) hypertension: Secondary | ICD-10-CM | POA: Diagnosis not present

## 2012-12-23 DIAGNOSIS — M109 Gout, unspecified: Secondary | ICD-10-CM | POA: Diagnosis not present

## 2012-12-23 DIAGNOSIS — I129 Hypertensive chronic kidney disease with stage 1 through stage 4 chronic kidney disease, or unspecified chronic kidney disease: Secondary | ICD-10-CM | POA: Diagnosis not present

## 2012-12-23 DIAGNOSIS — N2581 Secondary hyperparathyroidism of renal origin: Secondary | ICD-10-CM | POA: Diagnosis not present

## 2012-12-23 DIAGNOSIS — N184 Chronic kidney disease, stage 4 (severe): Secondary | ICD-10-CM | POA: Diagnosis not present

## 2013-01-08 DIAGNOSIS — N184 Chronic kidney disease, stage 4 (severe): Secondary | ICD-10-CM | POA: Diagnosis not present

## 2013-01-08 DIAGNOSIS — N2581 Secondary hyperparathyroidism of renal origin: Secondary | ICD-10-CM | POA: Diagnosis not present

## 2013-01-20 DIAGNOSIS — N401 Enlarged prostate with lower urinary tract symptoms: Secondary | ICD-10-CM | POA: Diagnosis not present

## 2013-01-20 DIAGNOSIS — N138 Other obstructive and reflux uropathy: Secondary | ICD-10-CM | POA: Diagnosis not present

## 2013-01-20 DIAGNOSIS — R972 Elevated prostate specific antigen [PSA]: Secondary | ICD-10-CM | POA: Diagnosis not present

## 2013-01-20 DIAGNOSIS — N529 Male erectile dysfunction, unspecified: Secondary | ICD-10-CM | POA: Diagnosis not present

## 2013-01-22 ENCOUNTER — Encounter: Payer: Medicare Other | Admitting: Internal Medicine

## 2013-02-02 DIAGNOSIS — E119 Type 2 diabetes mellitus without complications: Secondary | ICD-10-CM | POA: Diagnosis not present

## 2013-02-02 DIAGNOSIS — H52209 Unspecified astigmatism, unspecified eye: Secondary | ICD-10-CM | POA: Diagnosis not present

## 2013-02-02 DIAGNOSIS — H43819 Vitreous degeneration, unspecified eye: Secondary | ICD-10-CM | POA: Diagnosis not present

## 2013-02-02 DIAGNOSIS — Z961 Presence of intraocular lens: Secondary | ICD-10-CM | POA: Diagnosis not present

## 2013-02-25 DIAGNOSIS — E119 Type 2 diabetes mellitus without complications: Secondary | ICD-10-CM | POA: Diagnosis not present

## 2013-02-25 DIAGNOSIS — N2581 Secondary hyperparathyroidism of renal origin: Secondary | ICD-10-CM | POA: Diagnosis not present

## 2013-02-25 DIAGNOSIS — N184 Chronic kidney disease, stage 4 (severe): Secondary | ICD-10-CM | POA: Diagnosis not present

## 2013-02-25 DIAGNOSIS — I10 Essential (primary) hypertension: Secondary | ICD-10-CM | POA: Diagnosis not present

## 2013-02-25 DIAGNOSIS — I129 Hypertensive chronic kidney disease with stage 1 through stage 4 chronic kidney disease, or unspecified chronic kidney disease: Secondary | ICD-10-CM | POA: Diagnosis not present

## 2013-03-26 ENCOUNTER — Other Ambulatory Visit (HOSPITAL_COMMUNITY): Payer: Self-pay | Admitting: Internal Medicine

## 2013-03-26 ENCOUNTER — Ambulatory Visit (INDEPENDENT_AMBULATORY_CARE_PROVIDER_SITE_OTHER): Payer: Medicare Other | Admitting: Internal Medicine

## 2013-03-26 ENCOUNTER — Ambulatory Visit (HOSPITAL_COMMUNITY)
Admission: RE | Admit: 2013-03-26 | Discharge: 2013-03-26 | Disposition: A | Discharge status: Home / Self Care | Payer: Medicare Other | Source: Ambulatory Visit | Attending: Internal Medicine | Admitting: Internal Medicine

## 2013-03-26 ENCOUNTER — Encounter: Payer: Self-pay | Admitting: Internal Medicine

## 2013-03-26 VITALS — BP 122/56 | Temp 97.4°F | Wt 237.0 lb

## 2013-03-26 DIAGNOSIS — M79609 Pain in unspecified limb: Secondary | ICD-10-CM | POA: Insufficient documentation

## 2013-03-26 DIAGNOSIS — L03119 Cellulitis of unspecified part of limb: Secondary | ICD-10-CM | POA: Diagnosis not present

## 2013-03-26 DIAGNOSIS — M7989 Other specified soft tissue disorders: Secondary | ICD-10-CM | POA: Diagnosis not present

## 2013-03-26 DIAGNOSIS — M25562 Pain in left knee: Secondary | ICD-10-CM

## 2013-03-26 DIAGNOSIS — L02419 Cutaneous abscess of limb, unspecified: Secondary | ICD-10-CM | POA: Diagnosis not present

## 2013-03-26 DIAGNOSIS — M25569 Pain in unspecified knee: Secondary | ICD-10-CM

## 2013-03-26 DIAGNOSIS — E119 Type 2 diabetes mellitus without complications: Secondary | ICD-10-CM | POA: Insufficient documentation

## 2013-03-26 DIAGNOSIS — L03116 Cellulitis of left lower limb: Secondary | ICD-10-CM

## 2013-03-26 MED ORDER — CEPHALEXIN 250 MG PO CAPS: 250.0000 mg | ORAL_CAPSULE | Freq: Four times a day (QID) | ORAL | Status: DC

## 2013-03-26 MED ORDER — CEPHALEXIN 500 MG PO CAPS: 250.0000 mg | ORAL_CAPSULE | Freq: Four times a day (QID) | ORAL | Status: DC

## 2013-03-26 MED ORDER — CEFTRIAXONE SODIUM 1 G IJ SOLR
1.0000 g | Freq: Once | INTRAMUSCULAR | Status: AC
  Administered 2013-03-26: 1 g via INTRAMUSCULAR

## 2013-03-26 NOTE — Progress Notes (Signed)
  Subjective:    Patient ID: [name], male    DOB: [birth year], 72 y.o.   MRN: [id number]  HPI 72 year old black male with history of chronic kidney disease and diabetes mellitus in today with erythematous lesion on left anterior mid shin. Complaining of tenderness. May have accidentally traumatized leg resulting in redness and swelling. Has noticed this for several days. No fever or shaking chills.    Review of Systems     Objective:   Physical Exam 1+ pitting edema left lower extremity. Erythema over mid shin with increased warmth. Calf is tight but not significantly tender. Homans sign is negative.        Assessment & Plan:  Cellulitis left leg  Doppler study negative for DVT  Type 2 diabetes mellitus  Chronic kidney disease  Hypertension  History of gout  Plan: 1 g IM Rocephin. Keflex 250 mg 4 times daily for 10 days. Return in one week. Keep leg elevated. Warm hot compresses for 20 minutes twice daily

## 2013-03-26 NOTE — Progress Notes (Signed)
VASCULAR LAB PRELIMINARY  PRELIMINARY  PRELIMINARY  PRELIMINARY  Left lower extremity venous duplex completed.    Preliminary report:  Left:  No evidence of DVT, superficial thrombosis, or Baker's cyst.  Jyoti Harju, RVS 03/26/2013, 2:46 PM

## 2013-03-29 NOTE — Patient Instructions (Addendum)
Take antibiotics as directed and return in one week. No clot identified today on Doppler exam

## 2013-04-02 ENCOUNTER — Ambulatory Visit (INDEPENDENT_AMBULATORY_CARE_PROVIDER_SITE_OTHER): Payer: Medicare Other | Admitting: Internal Medicine

## 2013-04-02 ENCOUNTER — Encounter: Payer: Self-pay | Admitting: Internal Medicine

## 2013-04-02 VITALS — BP 104/54 | HR 64 | Temp 98.5°F | Wt 237.0 lb

## 2013-04-02 DIAGNOSIS — L539 Erythematous condition, unspecified: Secondary | ICD-10-CM

## 2013-04-02 DIAGNOSIS — L03119 Cellulitis of unspecified part of limb: Secondary | ICD-10-CM | POA: Diagnosis not present

## 2013-04-02 DIAGNOSIS — M79609 Pain in unspecified limb: Secondary | ICD-10-CM

## 2013-04-02 DIAGNOSIS — L02419 Cutaneous abscess of limb, unspecified: Secondary | ICD-10-CM

## 2013-04-02 DIAGNOSIS — M109 Gout, unspecified: Secondary | ICD-10-CM | POA: Diagnosis not present

## 2013-04-02 DIAGNOSIS — M79674 Pain in right toe(s): Secondary | ICD-10-CM

## 2013-04-02 DIAGNOSIS — L03116 Cellulitis of left lower limb: Secondary | ICD-10-CM

## 2013-04-02 LAB — CBC WITH DIFFERENTIAL/PLATELET
Basophils Absolute: 0 10*3/uL (ref 0.0–0.1)
Basophils Relative: 0 % (ref 0–1)
Eosinophils Absolute: 0.3 10*3/uL (ref 0.0–0.7)
Eosinophils Relative: 4 % (ref 0–5)
HCT: 37.8 % — ABNORMAL LOW (ref 39.0–52.0)
Hemoglobin: 12.7 g/dL — ABNORMAL LOW (ref 13.0–17.0)
Lymphocytes Relative: 31 % (ref 12–46)
Lymphs Abs: 2.1 10*3/uL (ref 0.7–4.0)
MCH: 32.4 pg (ref 26.0–34.0)
MCHC: 33.6 g/dL (ref 30.0–36.0)
MCV: 96.4 fL (ref 78.0–100.0)
Monocytes Absolute: 0.6 10*3/uL (ref 0.1–1.0)
Monocytes Relative: 9 % (ref 3–12)
Neutro Abs: 3.7 10*3/uL (ref 1.7–7.7)
Neutrophils Relative %: 56 % (ref 43–77)
Platelets: 169 10*3/uL (ref 150–400)
RBC: 3.92 MIL/uL — ABNORMAL LOW (ref 4.22–5.81)
RDW: 14.7 % (ref 11.5–15.5)
WBC: 6.7 10*3/uL (ref 4.0–10.5)

## 2013-04-02 LAB — URIC ACID: Uric Acid, Serum: 6.3 mg/dL (ref 4.0–7.8)

## 2013-04-02 NOTE — Progress Notes (Signed)
  Subjective:    Patient ID: [name], male    DOB: [birth year], 72 y.o.   MRN: [id number]  HPI  72 year old 72 male with cellulitis left anterior shin treated recently with cephalosporin. Cellulitis has essentially resolved. Today he has a new problem. Has pain in right great toe. Doesn't recall any injury to time. Was working and standing on his feet last week when he noticed it. Despite being on antibiotics. Toe is red and inflamed at IP joint. He does have a history of gout and is on allopurinol.      Review of Systems     Objective:   Physical Exam right great toe is tender to touch, erythema this with increased warmth, Particularly IP joint.        Assessment & Plan:  Gout right great toe  Cellulitis left anterior shin resolved  Plan: Sterapred DS 10 mg 6 day dosepak. Call next week with progress report. Serum uric acid drawn and results is normal. Continue allopurinol.

## 2013-04-03 ENCOUNTER — Ambulatory Visit: Payer: Medicare Other | Admitting: Internal Medicine

## 2013-04-04 ENCOUNTER — Encounter: Payer: Self-pay | Admitting: Internal Medicine

## 2013-04-04 NOTE — Patient Instructions (Addendum)
Take Sterapred DS 10 mg 6 day dosepak as directed. Continue allopurinol. Call next week with progress report.

## 2013-04-27 ENCOUNTER — Encounter: Payer: Medicare Other | Admitting: Internal Medicine

## 2013-06-25 DIAGNOSIS — N2581 Secondary hyperparathyroidism of renal origin: Secondary | ICD-10-CM | POA: Diagnosis not present

## 2013-06-25 DIAGNOSIS — M109 Gout, unspecified: Secondary | ICD-10-CM | POA: Diagnosis not present

## 2013-06-25 DIAGNOSIS — D509 Iron deficiency anemia, unspecified: Secondary | ICD-10-CM | POA: Diagnosis not present

## 2013-06-25 DIAGNOSIS — I129 Hypertensive chronic kidney disease with stage 1 through stage 4 chronic kidney disease, or unspecified chronic kidney disease: Secondary | ICD-10-CM | POA: Diagnosis not present

## 2013-06-25 DIAGNOSIS — N184 Chronic kidney disease, stage 4 (severe): Secondary | ICD-10-CM | POA: Diagnosis not present

## 2013-06-25 DIAGNOSIS — Z23 Encounter for immunization: Secondary | ICD-10-CM | POA: Diagnosis not present

## 2013-06-25 DIAGNOSIS — D649 Anemia, unspecified: Secondary | ICD-10-CM | POA: Diagnosis not present

## 2013-10-15 DIAGNOSIS — N2581 Secondary hyperparathyroidism of renal origin: Secondary | ICD-10-CM | POA: Diagnosis not present

## 2013-10-15 DIAGNOSIS — M109 Gout, unspecified: Secondary | ICD-10-CM | POA: Diagnosis not present

## 2013-10-15 DIAGNOSIS — N184 Chronic kidney disease, stage 4 (severe): Secondary | ICD-10-CM | POA: Diagnosis not present

## 2013-10-15 DIAGNOSIS — I129 Hypertensive chronic kidney disease with stage 1 through stage 4 chronic kidney disease, or unspecified chronic kidney disease: Secondary | ICD-10-CM | POA: Diagnosis not present

## 2013-10-15 DIAGNOSIS — G479 Sleep disorder, unspecified: Secondary | ICD-10-CM | POA: Diagnosis not present

## 2013-10-22 ENCOUNTER — Encounter: Payer: Self-pay | Admitting: Vascular Surgery

## 2013-10-22 ENCOUNTER — Other Ambulatory Visit: Payer: Self-pay | Admitting: *Deleted

## 2013-10-22 DIAGNOSIS — Z01810 Encounter for preprocedural cardiovascular examination: Secondary | ICD-10-CM

## 2013-10-22 DIAGNOSIS — N186 End stage renal disease: Secondary | ICD-10-CM

## 2013-11-17 ENCOUNTER — Encounter: Payer: Self-pay | Admitting: Vascular Surgery

## 2013-11-18 ENCOUNTER — Ambulatory Visit (INDEPENDENT_AMBULATORY_CARE_PROVIDER_SITE_OTHER)
Admission: RE | Admit: 2013-11-18 | Discharge: 2013-11-18 | Disposition: A | Discharge status: Home / Self Care | Payer: Medicare Other | Source: Ambulatory Visit | Attending: Vascular Surgery | Admitting: Vascular Surgery

## 2013-11-18 ENCOUNTER — Ambulatory Visit (INDEPENDENT_AMBULATORY_CARE_PROVIDER_SITE_OTHER): Payer: Medicare Other | Admitting: Vascular Surgery

## 2013-11-18 ENCOUNTER — Ambulatory Visit (HOSPITAL_COMMUNITY)
Admission: RE | Admit: 2013-11-18 | Discharge: 2013-11-18 | Disposition: A | Discharge status: Home / Self Care | Payer: Medicare Other | Source: Ambulatory Visit | Attending: Vascular Surgery | Admitting: Vascular Surgery

## 2013-11-18 ENCOUNTER — Encounter: Payer: Self-pay | Admitting: Vascular Surgery

## 2013-11-18 VITALS — BP 145/58 | HR 55 | Ht 70.25 in | Wt 234.0 lb

## 2013-11-18 DIAGNOSIS — Z01810 Encounter for preprocedural cardiovascular examination: Secondary | ICD-10-CM

## 2013-11-18 DIAGNOSIS — Z01818 Encounter for other preprocedural examination: Secondary | ICD-10-CM | POA: Insufficient documentation

## 2013-11-18 DIAGNOSIS — N186 End stage renal disease: Secondary | ICD-10-CM | POA: Insufficient documentation

## 2013-11-18 NOTE — Progress Notes (Signed)
[facility] [place]  Patient name: [name] MRN: [id number] DOB: [birth year] Sex: male  REASON FOR CONSULT: Evaluate for hemodialysis access.   HPI: [name] is a 73 y.o. male who is not yet on dialysis. 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 He denies any recent uremic symptoms. Specifically he denies nausea, vomiting, fatigue, anorexia, or palpitations.  He is right-handed.   Past Medical History  Diagnosis Date  . Diabetes mellitus   . Hypertension   . Thyroid disease   . PVD (peripheral vascular disease)   . Renal insufficiency   . Hyperlipidemia   . ED (erectile dysfunction)   . Allergy   . Elevated homocysteine    Family History  Problem Relation Age of Onset  . Aneurysm Mother   . Heart disease Father   . Stroke Father   . Hypertension Father   . Diabetes Father    SOCIAL HISTORY: History  Substance Use Topics  . Smoking status: Former Smoker    Quit date: 09/24/1994  . Smokeless tobacco: Not on file  . Alcohol Use: Not on file   Allergies  Allergen Reactions  . Penicillins    Current Outpatient Prescriptions  Medication Sig Dispense Refill  . allopurinol (ZYLOPRIM) 100 MG tablet Take 100 mg by mouth daily.        Marland Kitchen aspirin 325 MG tablet Take 325 mg by mouth daily.        . calcitRIOL (ROCALTROL) 0.25 MCG capsule Take 0.25 mcg by mouth daily.      . furosemide (LASIX) 80 MG tablet Take 80 mg by mouth 2 (two) times daily.        Marland Kitchen glipiZIDE (GLUCOTROL) 10 MG tablet Take 10 mg by mouth 2 (two) times daily before a meal.        . insulin glargine (LANTUS) 100 UNIT/ML injection Inject into the skin at bedtime.        Marland Kitchen labetalol (NORMODYNE) 200 MG tablet Take 200 mg by mouth 2 (two) times daily.        Marland Kitchen levothyroxine (SYNTHROID, LEVOTHROID) 25 MCG tablet Take 25 mcg by mouth daily.        Marland Kitchen NIFEdipine (PROCARDIA-XL/ADALAT CC) 60 MG 24 hr tablet Take 60 mg by mouth daily.        .  pioglitazone (ACTOS) 45 MG tablet Take 45 mg by mouth daily.        . simvastatin (ZOCOR) 20 MG tablet Take 20 mg by mouth at bedtime.        . cephALEXin (KEFLEX) 250 MG capsule Take 1 capsule (250 mg total) by mouth 4 (four) times daily.  40 capsule  0  . ipratropium (ATROVENT) 0.03 % nasal spray Place 2 sprays into the nose as needed.       . Vitamin D, Ergocalciferol, (DRISDOL) 50000 UNITS CAPS Take 50,000 Units by mouth.       No current facility-administered medications for this visit.   REVIEW OF SYSTEMS: [name] ] denotes positive finding; [  ] denotes negative finding  CARDIOVASCULAR:  [ ]  chest pain   [ ]  chest pressure   [ ]  palpitations   [ ]  orthopnea   [ ]  dyspnea on exertion   [ ]  claudication   [ ]  rest pain   [ ]  DVT   [ ]  phlebitis PULMONARY:   [ ]  productive cough   [ ]  asthma   [ ]  wheezing  NEUROLOGIC:   [ ]  weakness  [ ]  paresthesias  [ ]  aphasia  [ ]  amaurosis  [ ]  dizziness HEMATOLOGIC:   [ ]  bleeding problems   [ ]  clotting disorders MUSCULOSKELETAL:  [ ]  joint pain   [ ]  joint swelling [ ]  leg swelling GASTROINTESTINAL: [ ]   blood in stool  [ ]   hematemesis GENITOURINARY:  [ ]   dysuria  [ ]   hematuria PSYCHIATRIC:  [ ]  history of major depression INTEGUMENTARY:  [ ]  rashes  [ ]  ulcers CONSTITUTIONAL:  [ ]  fever   [ ]  chills  PHYSICAL EXAM: Filed Vitals:   11/18/13 1153  BP: 145/58  Pulse: 55  Height: 5' 10.25" (1.784 m)  Weight: 234 lb (106.142 kg)  SpO2: 99%   Body mass index is 33.35 kg/(m^2). GENERAL: The patient is a well-nourished male, in no acute distress. The vital signs are documented above. CARDIOVASCULAR: There is a regular rate and rhythm. I do not detect carotid bruits. He has palpable brachial and radial pulses bilaterally. PULMONARY: There is good air exchange bilaterally without wheezing or rales. ABDOMEN: Soft and non-tender with normal pitched bowel sounds.  MUSCULOSKELETAL: There are no major deformities or cyanosis. NEUROLOGIC: No focal  weakness or paresthesias are detected. SKIN: There are no ulcers or rashes noted. PSYCHIATRIC: The patient has a normal affect.  DATA:  I have reviewed his records from Dr. Allean Found office. 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  I have independently interpreted his vein mapping which shows that his upper arm cephalic vein on the left is good diameters ranging from 0.41-0.48 cm.  I have reviewed his arterial Doppler study which shows a normal Allen test bilaterally. He has triphasic Doppler signals in the radial and ulnar positions bilaterally. His brachial artery diameter on the left is 0.53 cm.  MEDICAL ISSUES:  End stage renal disease He appears to be a good candidate for a left brachiocephalic AV fistula. I have discussed the indications for the procedure and the potential complications including but not limited to, failure of the fistula to mature, or the need for subsequent interventions such as venoplasty or ligation of competing branches. He is not yet on dialysis. His surgery has been scheduled for 12/01/2013. All his questions were answered and he is agreeable to proceed.   [facility] [place] Beeper: [phone]

## 2013-11-18 NOTE — Assessment & Plan Note (Signed)
He appears to be a good candidate for a left brachiocephalic AV fistula. I have discussed the indications for the procedure and the potential complications including but not limited to, failure of the fistula to mature, or the need for subsequent interventions such as venoplasty or ligation of competing branches. He is not yet on dialysis. His surgery has been scheduled for 12/01/2013. All his questions were answered and he is agreeable to proceed.

## 2013-11-19 ENCOUNTER — Other Ambulatory Visit: Payer: Self-pay | Admitting: *Deleted

## 2013-11-20 ENCOUNTER — Encounter (HOSPITAL_COMMUNITY): Payer: Self-pay | Admitting: Pharmacy Technician

## 2013-11-30 ENCOUNTER — Encounter (HOSPITAL_COMMUNITY): Payer: Self-pay | Admitting: *Deleted

## 2013-11-30 MED ORDER — VANCOMYCIN HCL 10 G IV SOLR
1500.0000 mg | INTRAVENOUS | Status: AC
  Administered 2013-12-01: 1500 mg via INTRAVENOUS
  Filled 2013-11-30: qty 1500

## 2013-11-30 NOTE — Progress Notes (Signed)
PCP is Dr. Tommie Ard [facility]

## 2013-12-01 ENCOUNTER — Encounter (HOSPITAL_COMMUNITY): Payer: Medicare Other | Admitting: Anesthesiology

## 2013-12-01 ENCOUNTER — Ambulatory Visit (HOSPITAL_COMMUNITY): Payer: Medicare Other

## 2013-12-01 ENCOUNTER — Encounter (HOSPITAL_COMMUNITY)
Admission: RE | Disposition: A | Discharge status: Home / Self Care | Payer: Self-pay | Source: Ambulatory Visit | Attending: Vascular Surgery

## 2013-12-01 ENCOUNTER — Ambulatory Visit (HOSPITAL_COMMUNITY)
Admission: RE | Admit: 2013-12-01 | Discharge: 2013-12-01 | Disposition: A | Discharge status: Home / Self Care | Payer: Medicare Other | Source: Ambulatory Visit | Attending: Vascular Surgery | Admitting: Vascular Surgery

## 2013-12-01 ENCOUNTER — Other Ambulatory Visit: Payer: Self-pay | Admitting: *Deleted

## 2013-12-01 ENCOUNTER — Ambulatory Visit (HOSPITAL_COMMUNITY): Payer: Medicare Other | Admitting: Anesthesiology

## 2013-12-01 ENCOUNTER — Encounter (HOSPITAL_COMMUNITY): Payer: Self-pay | Admitting: *Deleted

## 2013-12-01 DIAGNOSIS — N184 Chronic kidney disease, stage 4 (severe): Secondary | ICD-10-CM | POA: Insufficient documentation

## 2013-12-01 DIAGNOSIS — Z794 Long term (current) use of insulin: Secondary | ICD-10-CM | POA: Diagnosis not present

## 2013-12-01 DIAGNOSIS — E1129 Type 2 diabetes mellitus with other diabetic kidney complication: Secondary | ICD-10-CM | POA: Insufficient documentation

## 2013-12-01 DIAGNOSIS — Z7982 Long term (current) use of aspirin: Secondary | ICD-10-CM | POA: Insufficient documentation

## 2013-12-01 DIAGNOSIS — I129 Hypertensive chronic kidney disease with stage 1 through stage 4 chronic kidney disease, or unspecified chronic kidney disease: Secondary | ICD-10-CM | POA: Insufficient documentation

## 2013-12-01 DIAGNOSIS — I10 Essential (primary) hypertension: Secondary | ICD-10-CM | POA: Diagnosis not present

## 2013-12-01 DIAGNOSIS — E079 Disorder of thyroid, unspecified: Secondary | ICD-10-CM | POA: Diagnosis not present

## 2013-12-01 DIAGNOSIS — Z87891 Personal history of nicotine dependence: Secondary | ICD-10-CM | POA: Insufficient documentation

## 2013-12-01 DIAGNOSIS — N185 Chronic kidney disease, stage 5: Secondary | ICD-10-CM | POA: Diagnosis not present

## 2013-12-01 DIAGNOSIS — Z4931 Encounter for adequacy testing for hemodialysis: Secondary | ICD-10-CM

## 2013-12-01 DIAGNOSIS — N186 End stage renal disease: Secondary | ICD-10-CM

## 2013-12-01 DIAGNOSIS — N189 Chronic kidney disease, unspecified: Secondary | ICD-10-CM

## 2013-12-01 DIAGNOSIS — Z01818 Encounter for other preprocedural examination: Secondary | ICD-10-CM | POA: Diagnosis not present

## 2013-12-01 DIAGNOSIS — E119 Type 2 diabetes mellitus without complications: Secondary | ICD-10-CM | POA: Diagnosis not present

## 2013-12-01 HISTORY — PX: AV FISTULA PLACEMENT: SHX1204

## 2013-12-01 HISTORY — DX: Unspecified osteoarthritis, unspecified site: M19.90

## 2013-12-01 HISTORY — DX: Diverticulitis of intestine, part unspecified, without perforation or abscess without bleeding: K57.92

## 2013-12-01 LAB — GLUCOSE, CAPILLARY
Glucose-Capillary: 71 mg/dL (ref 70–99)
Glucose-Capillary: 69 mg/dL — ABNORMAL LOW (ref 70–99)
Glucose-Capillary: 132 mg/dL — ABNORMAL HIGH (ref 70–99)

## 2013-12-01 LAB — POCT I-STAT 4, (NA,K, GLUC, HGB,HCT)
Glucose, Bld: 79 mg/dL (ref 70–99)
HCT: 37 % — ABNORMAL LOW (ref 39.0–52.0)
Hemoglobin: 12.6 g/dL — ABNORMAL LOW (ref 13.0–17.0)
Potassium: 4.1 mEq/L (ref 3.7–5.3)
Sodium: 146 mEq/L (ref 137–147)

## 2013-12-01 SURGERY — ARTERIOVENOUS (AV) FISTULA CREATION
Anesthesia: Monitor Anesthesia Care | Laterality: Left

## 2013-12-01 MED ORDER — SODIUM CHLORIDE 0.9 % IV SOLN
INTRAVENOUS | Status: DC | PRN
  Administered 2013-12-01: 07:00:00 via INTRAVENOUS

## 2013-12-01 MED ORDER — HEPARIN SODIUM (PORCINE) 1000 UNIT/ML IJ SOLN
INTRAMUSCULAR | Status: AC
  Filled 2013-12-01: qty 1

## 2013-12-01 MED ORDER — PROTAMINE SULFATE 10 MG/ML IV SOLN
INTRAVENOUS | Status: DC | PRN
  Administered 2013-12-01 (×4): 10 mg via INTRAVENOUS

## 2013-12-01 MED ORDER — PROPOFOL 10 MG/ML IV BOLUS
INTRAVENOUS | Status: AC
  Filled 2013-12-01: qty 20

## 2013-12-01 MED ORDER — ONDANSETRON HCL 4 MG/2ML IJ SOLN
INTRAMUSCULAR | Status: DC | PRN
  Administered 2013-12-01: 4 mg via INTRAVENOUS

## 2013-12-01 MED ORDER — LIDOCAINE HCL (CARDIAC) 20 MG/ML IV SOLN
INTRAVENOUS | Status: AC
  Filled 2013-12-01: qty 5

## 2013-12-01 MED ORDER — FENTANYL CITRATE 0.05 MG/ML IJ SOLN
INTRAMUSCULAR | Status: DC | PRN
  Administered 2013-12-01: 50 ug via INTRAVENOUS

## 2013-12-01 MED ORDER — LIDOCAINE-EPINEPHRINE (PF) 1 %-1:200000 IJ SOLN
INTRAMUSCULAR | Status: DC | PRN
  Administered 2013-12-01: 30 mL

## 2013-12-01 MED ORDER — HEPARIN SODIUM (PORCINE) 5000 UNIT/ML IJ SOLN
INTRAMUSCULAR | Status: DC | PRN
  Administered 2013-12-01: 08:00:00

## 2013-12-01 MED ORDER — HEPARIN SODIUM (PORCINE) 1000 UNIT/ML IJ SOLN
INTRAMUSCULAR | Status: DC | PRN
  Administered 2013-12-01: 7000 [IU] via INTRAVENOUS

## 2013-12-01 MED ORDER — 0.9 % SODIUM CHLORIDE (POUR BTL) OPTIME
TOPICAL | Status: DC | PRN
  Administered 2013-12-01: 1000 mL

## 2013-12-01 MED ORDER — FENTANYL CITRATE 0.05 MG/ML IJ SOLN
INTRAMUSCULAR | Status: AC
  Filled 2013-12-01: qty 5

## 2013-12-01 MED ORDER — ONDANSETRON HCL 4 MG/2ML IJ SOLN
INTRAMUSCULAR | Status: AC
  Filled 2013-12-01: qty 2

## 2013-12-01 MED ORDER — OXYCODONE-ACETAMINOPHEN 5-325 MG PO TABS: 1.0000 | ORAL_TABLET | ORAL | Status: DC | PRN

## 2013-12-01 MED ORDER — LIDOCAINE HCL (PF) 1 % IJ SOLN
INTRAMUSCULAR | Status: AC
  Filled 2013-12-01: qty 30

## 2013-12-01 MED ORDER — PROTAMINE SULFATE 10 MG/ML IV SOLN
INTRAVENOUS | Status: AC
  Filled 2013-12-01: qty 5

## 2013-12-01 MED ORDER — MIDAZOLAM HCL 5 MG/5ML IJ SOLN
INTRAMUSCULAR | Status: DC | PRN
  Administered 2013-12-01 (×2): 1 mg via INTRAVENOUS

## 2013-12-01 MED ORDER — THROMBIN 20000 UNITS EX SOLR
CUTANEOUS | Status: AC
  Filled 2013-12-01: qty 20000

## 2013-12-01 MED ORDER — MIDAZOLAM HCL 2 MG/2ML IJ SOLN
INTRAMUSCULAR | Status: AC
  Filled 2013-12-01: qty 2

## 2013-12-01 MED ORDER — PROPOFOL INFUSION 10 MG/ML OPTIME
INTRAVENOUS | Status: DC | PRN
  Administered 2013-12-01: 100 ug/kg/min via INTRAVENOUS

## 2013-12-01 MED ORDER — ROCURONIUM BROMIDE 50 MG/5ML IV SOLN
INTRAVENOUS | Status: AC
  Filled 2013-12-01: qty 1

## 2013-12-01 SURGICAL SUPPLY — 40 items
ADH SKN CLS APL DERMABOND .7 (GAUZE/BANDAGES/DRESSINGS) ×1
ADH SKN CLS LQ APL DERMABOND (GAUZE/BANDAGES/DRESSINGS) ×1
ARMBAND PINK RESTRICT EXTREMIT (MISCELLANEOUS) ×3 IMPLANT
CANISTER SUCTION 2500CC (MISCELLANEOUS) ×3 IMPLANT
CLIP TI MEDIUM 6 (CLIP) ×3 IMPLANT
CLIP TI WIDE RED SMALL 6 (CLIP) ×3 IMPLANT
COVER PROBE W GEL 5X96 (DRAPES) ×2 IMPLANT
COVER SURGICAL LIGHT HANDLE (MISCELLANEOUS) ×3 IMPLANT
DECANTER SPIKE VIAL GLASS SM (MISCELLANEOUS) ×3 IMPLANT
DERMABOND ADHESIVE PROPEN (GAUZE/BANDAGES/DRESSINGS) ×2
DERMABOND ADVANCED (GAUZE/BANDAGES/DRESSINGS) ×2
DERMABOND ADVANCED .7 DNX12 (GAUZE/BANDAGES/DRESSINGS) ×1 IMPLANT
DERMABOND ADVANCED .7 DNX6 (GAUZE/BANDAGES/DRESSINGS) IMPLANT
DRAIN PENROSE 1/2X12 LTX STRL (WOUND CARE) IMPLANT
ELECT REM PT RETURN 9FT ADLT (ELECTROSURGICAL) ×3
ELECTRODE REM PT RTRN 9FT ADLT (ELECTROSURGICAL) ×1 IMPLANT
GLOVE BIO SURGEON STRL SZ7.5 (GLOVE) ×3 IMPLANT
GLOVE BIOGEL PI IND STRL 6.5 (GLOVE) IMPLANT
GLOVE BIOGEL PI IND STRL 7.0 (GLOVE) IMPLANT
GLOVE BIOGEL PI IND STRL 8 (GLOVE) ×1 IMPLANT
GLOVE BIOGEL PI INDICATOR 6.5 (GLOVE) ×2
GLOVE BIOGEL PI INDICATOR 7.0 (GLOVE) ×6
GLOVE BIOGEL PI INDICATOR 8 (GLOVE) ×2
GLOVE ECLIPSE 6.5 STRL STRAW (GLOVE) ×6 IMPLANT
GOWN STRL REUS W/ TWL LRG LVL3 (GOWN DISPOSABLE) ×3 IMPLANT
GOWN STRL REUS W/TWL LRG LVL3 (GOWN DISPOSABLE) ×12
KIT BASIN OR (CUSTOM PROCEDURE TRAY) ×3 IMPLANT
KIT ROOM TURNOVER OR (KITS) ×3 IMPLANT
NS IRRIG 1000ML POUR BTL (IV SOLUTION) ×3 IMPLANT
PACK CV ACCESS (CUSTOM PROCEDURE TRAY) ×3 IMPLANT
PAD ARMBOARD 7.5X6 YLW CONV (MISCELLANEOUS) ×6 IMPLANT
SPONGE SURGIFOAM ABS GEL 100 (HEMOSTASIS) IMPLANT
SUT PROLENE 6 0 BV (SUTURE) ×5 IMPLANT
SUT VIC AB 3-0 SH 27 (SUTURE) ×3
SUT VIC AB 3-0 SH 27X BRD (SUTURE) ×1 IMPLANT
SUT VICRYL 4-0 PS2 18IN ABS (SUTURE) ×3 IMPLANT
TOWEL OR 17X24 6PK STRL BLUE (TOWEL DISPOSABLE) ×3 IMPLANT
TOWEL OR 17X26 10 PK STRL BLUE (TOWEL DISPOSABLE) ×3 IMPLANT
UNDERPAD 30X30 INCONTINENT (UNDERPADS AND DIAPERS) ×3 IMPLANT
WATER STERILE IRR 1000ML POUR (IV SOLUTION) ×3 IMPLANT

## 2013-12-01 NOTE — Anesthesia Procedure Notes (Signed)
Procedure Name: MAC Date/Time: 12/01/2013 7:35 AM Performed by: Erik Obey Pre-anesthesia Checklist: Patient identified, Emergency Drugs available, Timeout performed, Patient being monitored and Suction available Patient Re-evaluated:Patient Re-evaluated prior to inductionOxygen Delivery Method: Simple face mask Preoxygenation: Pre-oxygenation with 100% oxygen Dental Injury: Teeth and Oropharynx as per pre-operative assessment

## 2013-12-01 NOTE — H&P (View-Only) (Signed)
[facility] [place]  Patient name: [name] MRN: [id number] DOB: Dec 12, 1940 Sex: male  REASON FOR CONSULT: Evaluate for hemodialysis access.   HPI: [name] is a 73 y.o. male who is not yet on dialysis. 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 He denies any recent uremic symptoms. Specifically he denies nausea, vomiting, fatigue, anorexia, or palpitations.  He is right-handed.   Past Medical History  Diagnosis Date  . Diabetes mellitus   . Hypertension   . Thyroid disease   . PVD (peripheral vascular disease)   . Renal insufficiency   . Hyperlipidemia   . ED (erectile dysfunction)   . Allergy   . Elevated homocysteine    Family History  Problem Relation Age of Onset  . Aneurysm Mother   . Heart disease Father   . Stroke Father   . Hypertension Father   . Diabetes Father    SOCIAL HISTORY: History  Substance Use Topics  . Smoking status: Former Smoker    Quit date: 09/24/1994  . Smokeless tobacco: Not on file  . Alcohol Use: Not on file   Allergies  Allergen Reactions  . Penicillins    Current Outpatient Prescriptions  Medication Sig Dispense Refill  . allopurinol (ZYLOPRIM) 100 MG tablet Take 100 mg by mouth daily.        Marland Kitchen aspirin 325 MG tablet Take 325 mg by mouth daily.        . calcitRIOL (ROCALTROL) 0.25 MCG capsule Take 0.25 mcg by mouth daily.      . furosemide (LASIX) 80 MG tablet Take 80 mg by mouth 2 (two) times daily.        Marland Kitchen glipiZIDE (GLUCOTROL) 10 MG tablet Take 10 mg by mouth 2 (two) times daily before a meal.        . insulin glargine (LANTUS) 100 UNIT/ML injection Inject into the skin at bedtime.        Marland Kitchen labetalol (NORMODYNE) 200 MG tablet Take 200 mg by mouth 2 (two) times daily.        Marland Kitchen levothyroxine (SYNTHROID, LEVOTHROID) 25 MCG tablet Take 25 mcg by mouth daily.        Marland Kitchen NIFEdipine (PROCARDIA-XL/ADALAT CC) 60 MG 24 hr tablet Take 60 mg by mouth daily.        .  pioglitazone (ACTOS) 45 MG tablet Take 45 mg by mouth daily.        . simvastatin (ZOCOR) 20 MG tablet Take 20 mg by mouth at bedtime.        . cephALEXin (KEFLEX) 250 MG capsule Take 1 capsule (250 mg total) by mouth 4 (four) times daily.  40 capsule  0  . ipratropium (ATROVENT) 0.03 % nasal spray Place 2 sprays into the nose as needed.       . Vitamin D, Ergocalciferol, (DRISDOL) 50000 UNITS CAPS Take 50,000 Units by mouth.       No current facility-administered medications for this visit.   REVIEW OF SYSTEMS: [name] ] denotes positive finding; [  ] denotes negative finding  CARDIOVASCULAR:  [ ]  chest pain   [ ]  chest pressure   [ ]  palpitations   [ ]  orthopnea   [ ]  dyspnea on exertion   [ ]  claudication   [ ]  rest pain   [ ]  DVT   [ ]  phlebitis PULMONARY:   [ ]  productive cough   [ ]  asthma   [ ]  wheezing  NEUROLOGIC:   [ ]  weakness  [ ]  paresthesias  [ ]  aphasia  [ ]  amaurosis  [ ]  dizziness HEMATOLOGIC:   [ ]  bleeding problems   [ ]  clotting disorders MUSCULOSKELETAL:  [ ]  joint pain   [ ]  joint swelling [ ]  leg swelling GASTROINTESTINAL: [ ]   blood in stool  [ ]   hematemesis GENITOURINARY:  [ ]   dysuria  [ ]   hematuria PSYCHIATRIC:  [ ]  history of major depression INTEGUMENTARY:  [ ]  rashes  [ ]  ulcers CONSTITUTIONAL:  [ ]  fever   [ ]  chills  PHYSICAL EXAM: Filed Vitals:   11/18/13 1153  BP: 145/58  Pulse: 55  Height: 5' 10.25" (1.784 m)  Weight: 234 lb (106.142 kg)  SpO2: 99%   Body mass index is 33.35 kg/(m^2). GENERAL: The patient is a well-nourished male, in no acute distress. The vital signs are documented above. CARDIOVASCULAR: There is a regular rate and rhythm. I do not detect carotid bruits. He has palpable brachial and radial pulses bilaterally. PULMONARY: There is good air exchange bilaterally without wheezing or rales. ABDOMEN: Soft and non-tender with normal pitched bowel sounds.  MUSCULOSKELETAL: There are no major deformities or cyanosis. NEUROLOGIC: No focal  weakness or paresthesias are detected. SKIN: There are no ulcers or rashes noted. PSYCHIATRIC: The patient has a normal affect.  DATA:  I have reviewed his records from Dr. Allean Found office. 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  I have independently interpreted his vein mapping which shows that his upper arm cephalic vein on the left is good diameters ranging from 0.41-0.48 cm.  I have reviewed his arterial Doppler study which shows a normal Allen test bilaterally. He has triphasic Doppler signals in the radial and ulnar positions bilaterally. His brachial artery diameter on the left is 0.53 cm.  MEDICAL ISSUES:  End stage renal disease He appears to be a good candidate for a left brachiocephalic AV fistula. I have discussed the indications for the procedure and the potential complications including but not limited to, failure of the fistula to mature, or the need for subsequent interventions such as venoplasty or ligation of competing branches. He is not yet on dialysis. His surgery has been scheduled for 12/01/2013. All his questions were answered and he is agreeable to proceed.   [place] [facility] [place] Beeper: [phone]

## 2013-12-01 NOTE — Progress Notes (Signed)
Dr.Dickson at bedside and aware of CBG=69. Pt aymptomatic--drinking ginger ale and eating pb crackers at this time. No new orders. Will cont to monitor.

## 2013-12-01 NOTE — Preoperative (Signed)
Beta Blockers   Reason not to administer Beta Blockers:Not Applicable 

## 2013-12-01 NOTE — Op Note (Signed)
    NAME: [name]    MRN: [id number] DOB: 05-05-1941    DATE OF OPERATION: 12/01/2013  PREOP DIAGNOSIS: Stage IV chronic kidney disease  POSTOP DIAGNOSIS: same  PROCEDURE: left brachiocephalic AV fistula  SURGEON: Judeth Cornfield. Scot Dock, MD, FACS  ASSIST: none  ANESTHESIA: local with sedation   EBL: minimal  INDICATIONS: [name] is a 73 y.o. male presents for new access. The forearm cephalic vein appeared to small for a fistula. The basilic vein was fairly short. The upper arm cephalic vein was somewhat tortuous but reasonable size.  FINDINGS: 4 mm upper arm cephalic vein. Palpable radial pulse at the completion of the procedure.  TECHNIQUE: The patient was taken to the operating room and sedated by anesthesia. The left upper extremity was prepped and draped in the usual sterile fashion. After the skin was anesthetized with 1% lidocaine, a transverse incision was made just above the antecubital level. The cephalic vein was dissected free and ligated distally. It irrigated up nicely with heparinized saline. The brachial artery was dissected free beneath the fascia. The patient was heparinized. The brachial artery was clamped proximally and distally and a longitudinal arteriotomy was made. The vein was sewn end-to-side to the artery using continuous 6-0 Prolene suture. At the completion was a good thrill in the fistula and a palpable left radial pulse. The heparin was partially reversed with protamine. The wounds closed the deep layer of 3-0 Vicryl and the skin closed with 4-0 Vicryl. Dermabond was applied. The patient tolerated the procedure well and was transferred to the recovery room in stable condition. All needle and sponge counts were correct.  Deitra Mayo, MD, FACS [facility]  DATE OF DICTATION:   12/01/2013

## 2013-12-01 NOTE — Anesthesia Postprocedure Evaluation (Signed)
  Anesthesia Post-op Note  Patient: [name]  Procedure(s) Performed: Procedure(s): ARTERIOVENOUS (AV) FISTULA CREATION- LEFT BRACHIOCEPHALIC (Left)  Patient Location: PACU  Anesthesia Type:MAC  Level of Consciousness: awake and alert   Airway and Oxygen Therapy: Patient Spontanous Breathing  Post-op Pain: mild  Post-op Assessment: Post-op Vital signs reviewed  Post-op Vital Signs: stable  Complications: No apparent anesthesia complications

## 2013-12-01 NOTE — Transfer of Care (Signed)
Immediate Anesthesia Transfer of Care Note  Patient: [name]  Procedure(s) Performed: Procedure(s): ARTERIOVENOUS (AV) FISTULA CREATION- LEFT BRACHIOCEPHALIC (Left)  Patient Location: PACU  Anesthesia Type:MAC  Level of Consciousness: awake, alert  and oriented  Airway & Oxygen Therapy: Patient Spontanous Breathing and Patient connected to nasal cannula oxygen  Post-op Assessment: Report given to PACU RN and Post -op Vital signs reviewed and stable  Post vital signs: Reviewed and stable  Complications: No apparent anesthesia complications

## 2013-12-01 NOTE — Anesthesia Preprocedure Evaluation (Signed)
Anesthesia Evaluation  Patient identified by MRN, date of birth, ID band Patient awake    Reviewed: H&P , Patient's Chart, lab work & pertinent test results  Airway Mallampati: II  Neck ROM: Full    Dental   Pulmonary former smoker,  breath sounds clear to auscultation        Cardiovascular hypertension, + CABG Rhythm:Regular Rate:Normal     Neuro/Psych    GI/Hepatic   Endo/Other  diabetes  Renal/GU ESRFRenal disease     Musculoskeletal   Abdominal   Peds  Hematology   Anesthesia Other Findings   Reproductive/Obstetrics                           Anesthesia Physical Anesthesia Plan  ASA: III  Anesthesia Plan: MAC   Post-op Pain Management:    Induction: Intravenous  Airway Management Planned: Natural Airway and Simple Face Mask  Additional Equipment:   Intra-op Plan:   Post-operative Plan:   Informed Consent: I have reviewed the patients History and Physical, chart, labs and discussed the procedure including the risks, benefits and alternatives for the proposed anesthesia with the patient or authorized representative who has indicated his/her understanding and acceptance.     Plan Discussed with: CRNA and Surgeon  Anesthesia Plan Comments:         Anesthesia Quick Evaluation

## 2013-12-01 NOTE — Interval H&P Note (Signed)
History and Physical Interval Note:  12/01/2013 7:22 AM  [name]  has presented today for surgery, with the diagnosis of Chronic kidney disease  The various methods of treatment have been discussed with the patient and family. After consideration of risks, benefits and other options for treatment, the patient has consented to  Procedure(s): ARTERIOVENOUS (AV) FISTULA CREATION- LEFT BRACHIOCEPHALIC (Left) as a surgical intervention .  The patient's history has been reviewed, patient examined, no change in status, stable for surgery.  I have reviewed the patient's chart and labs.  Questions were answered to the patient's satisfaction.     DICKSON,CHRISTOPHER S

## 2013-12-02 ENCOUNTER — Telehealth: Payer: Self-pay | Admitting: Vascular Surgery

## 2013-12-02 NOTE — Telephone Encounter (Addendum)
Message copied by Gena Fray on Wed Dec 02, 2013  3:27 PM ------      Message from: Mena Goes      Created: Tue Dec 01, 2013 11:03 AM      Regarding: Schedule                   ----- Message -----         From: Angelia Mould, MD         Sent: 12/01/2013   9:08 AM           To: Vvs Charge Pool      Subject: charge and f/u                                           PROCEDURE: left brachiocephalic AV fistula            SURGEON: Judeth Cornfield. Scot Dock, MD, FACS            ASSIST: none            He will need a follow up visit in 6 weeks with a duplex of his fistula to check on the maturation of this fistula. Thank you. CD ------  12/02/13: lm for pt re appt, dpm

## 2013-12-03 ENCOUNTER — Encounter (HOSPITAL_COMMUNITY): Payer: Self-pay | Admitting: Vascular Surgery

## 2014-01-13 DIAGNOSIS — N2581 Secondary hyperparathyroidism of renal origin: Secondary | ICD-10-CM | POA: Diagnosis not present

## 2014-01-13 DIAGNOSIS — M109 Gout, unspecified: Secondary | ICD-10-CM | POA: Diagnosis not present

## 2014-01-13 DIAGNOSIS — I129 Hypertensive chronic kidney disease with stage 1 through stage 4 chronic kidney disease, or unspecified chronic kidney disease: Secondary | ICD-10-CM | POA: Diagnosis not present

## 2014-01-13 DIAGNOSIS — N184 Chronic kidney disease, stage 4 (severe): Secondary | ICD-10-CM | POA: Diagnosis not present

## 2014-01-13 DIAGNOSIS — G479 Sleep disorder, unspecified: Secondary | ICD-10-CM | POA: Diagnosis not present

## 2014-01-19 ENCOUNTER — Encounter: Payer: Self-pay | Admitting: Vascular Surgery

## 2014-01-20 ENCOUNTER — Ambulatory Visit (INDEPENDENT_AMBULATORY_CARE_PROVIDER_SITE_OTHER): Payer: Self-pay | Admitting: Vascular Surgery

## 2014-01-20 ENCOUNTER — Encounter: Payer: Self-pay | Admitting: Vascular Surgery

## 2014-01-20 ENCOUNTER — Ambulatory Visit (HOSPITAL_COMMUNITY)
Admission: RE | Admit: 2014-01-20 | Discharge: 2014-01-20 | Disposition: A | Discharge status: Home / Self Care | Payer: Medicare Other | Source: Ambulatory Visit | Attending: Vascular Surgery | Admitting: Vascular Surgery

## 2014-01-20 VITALS — BP 127/52 | HR 55 | Ht 70.5 in | Wt 240.6 lb

## 2014-01-20 DIAGNOSIS — N401 Enlarged prostate with lower urinary tract symptoms: Secondary | ICD-10-CM | POA: Diagnosis not present

## 2014-01-20 DIAGNOSIS — N186 End stage renal disease: Secondary | ICD-10-CM | POA: Insufficient documentation

## 2014-01-20 DIAGNOSIS — N138 Other obstructive and reflux uropathy: Secondary | ICD-10-CM | POA: Diagnosis not present

## 2014-01-20 DIAGNOSIS — N529 Male erectile dysfunction, unspecified: Secondary | ICD-10-CM | POA: Diagnosis not present

## 2014-01-20 DIAGNOSIS — Z4931 Encounter for adequacy testing for hemodialysis: Secondary | ICD-10-CM | POA: Insufficient documentation

## 2014-01-20 DIAGNOSIS — N139 Obstructive and reflux uropathy, unspecified: Secondary | ICD-10-CM | POA: Diagnosis not present

## 2014-01-20 NOTE — Progress Notes (Signed)
   Patient name: [name] MRN: [id number] DOB: 03/16/1941 Sex: male  REASON FOR VISIT: Follow up after left brachiocephalic AV fistula  HPI: [name] is a 73 y.o. male with stage IV chronic kidney disease. He had a left brachiocephalic AV fistula placed on 12/01/2013. He comes in for a 6 week follow up visit. He has no specific complaints. He denies pain or paresthesias in his left upper extremity.  REVIEW OF SYSTEMS: [name] ] denotes positive finding; [  ] denotes negative finding  CARDIOVASCULAR:  [ ]  chest pain   [ ]  dyspnea on exertion    CONSTITUTIONAL:  [ ]  fever   [ ]  chills  PHYSICAL EXAM: Filed Vitals:   01/20/14 1026  BP: 127/52  Pulse: 55  Height: 5' 10.5" (1.791 m)  Weight: 240 lb 9 oz (109.117 kg)  SpO2: 100%   Body mass index is 34.02 kg/(m^2). GENERAL: The patient is a well-nourished male, in no acute distress. The vital signs are documented above. CARDIOVASCULAR: There is a regular rate and rhythm. PULMONARY: There is good air exchange bilaterally without wheezing or rales. The fistula has a good thrill but is very tortuous. He has a palpable left radial pulse.  DUPLEX OF AV FISTULA: The left AV fistula is widely patent. Diameters range from 0.47-0.85 cm. Depths range from 0.36 cm-0.50 cm. The fistula is noted to be somewhat tortuous just above the antecubital level. Several competing branches are identified. There were no areas of significantly increased velocity noted.  MEDICAL ISSUES:  End stage renal disease This fistula has reasonable diameters however I do not think it would be usable for access because of the tortuosity of the vein above the antecubital level. In addition, there are some competing branches which can be diverting flow and preventing continued maturation of the fistula. I have recommended revision of the fistula which would involve mobilizing the vein and resecting a redundant segment in addition to ligating the competing branches. The  patient still works in these 2 look at his work schedule before deciding when he can schedule surgery. We have reviewed the indications for the procedure, specifically that I do not think the vein could be cannulated because of the tortuosity. We have also reviewed the potential complications of surgery and he is agreeable to proceed.   Angelia Mould [facility] [place] Beeper: [phone]

## 2014-01-20 NOTE — Assessment & Plan Note (Signed)
This fistula has reasonable diameters however I do not think it would be usable for access because of the tortuosity of the vein above the antecubital level. In addition, there are some competing branches which can be diverting flow and preventing continued maturation of the fistula. I have recommended revision of the fistula which would involve mobilizing the vein and resecting a redundant segment in addition to ligating the competing branches. The patient still works in these 2 look at his work schedule before deciding when he can schedule surgery. We have reviewed the indications for the procedure, specifically that I do not think the vein could be cannulated because of the tortuosity. We have also reviewed the potential complications of surgery and he is agreeable to proceed.

## 2014-01-26 ENCOUNTER — Other Ambulatory Visit: Payer: Self-pay

## 2014-01-28 DIAGNOSIS — N139 Obstructive and reflux uropathy, unspecified: Secondary | ICD-10-CM | POA: Diagnosis not present

## 2014-01-28 DIAGNOSIS — N401 Enlarged prostate with lower urinary tract symptoms: Secondary | ICD-10-CM | POA: Diagnosis not present

## 2014-01-28 DIAGNOSIS — N138 Other obstructive and reflux uropathy: Secondary | ICD-10-CM | POA: Diagnosis not present

## 2014-02-01 ENCOUNTER — Other Ambulatory Visit: Payer: Medicare Other | Admitting: Internal Medicine

## 2014-02-01 ENCOUNTER — Other Ambulatory Visit: Payer: Self-pay | Admitting: Internal Medicine

## 2014-02-01 DIAGNOSIS — Z130 Encounter for screening for diseases of the blood and blood-forming organs and certain disorders involving the immune mechanism: Secondary | ICD-10-CM

## 2014-02-01 DIAGNOSIS — E039 Hypothyroidism, unspecified: Secondary | ICD-10-CM

## 2014-02-01 DIAGNOSIS — E785 Hyperlipidemia, unspecified: Secondary | ICD-10-CM | POA: Diagnosis not present

## 2014-02-01 DIAGNOSIS — E119 Type 2 diabetes mellitus without complications: Secondary | ICD-10-CM

## 2014-02-01 DIAGNOSIS — Z79899 Other long term (current) drug therapy: Secondary | ICD-10-CM

## 2014-02-01 DIAGNOSIS — Z125 Encounter for screening for malignant neoplasm of prostate: Secondary | ICD-10-CM

## 2014-02-01 DIAGNOSIS — D649 Anemia, unspecified: Secondary | ICD-10-CM | POA: Diagnosis not present

## 2014-02-01 LAB — LIPID PANEL
Cholesterol: 105 mg/dL (ref 0–200)
HDL: 37 mg/dL — ABNORMAL LOW (ref >39)
LDL Cholesterol: 59 mg/dL (ref 0–99)
Total CHOL/HDL Ratio: 2.8 Ratio
Triglycerides: 46 mg/dL (ref <150)
VLDL: 9 mg/dL (ref 0–40)

## 2014-02-01 LAB — CBC WITH DIFFERENTIAL/PLATELET
Basophils Absolute: 0.1 10*3/uL (ref 0.0–0.1)
Basophils Relative: 1 % (ref 0–1)
Eosinophils Absolute: 0.2 10*3/uL (ref 0.0–0.7)
Eosinophils Relative: 4 % (ref 0–5)
HCT: 34.4 % — ABNORMAL LOW (ref 39.0–52.0)
Hemoglobin: 11.4 g/dL — ABNORMAL LOW (ref 13.0–17.0)
Lymphocytes Relative: 29 % (ref 12–46)
Lymphs Abs: 1.8 10*3/uL (ref 0.7–4.0)
MCH: 32.6 pg (ref 26.0–34.0)
MCHC: 33.1 g/dL (ref 30.0–36.0)
MCV: 98.3 fL (ref 78.0–100.0)
Monocytes Absolute: 0.5 10*3/uL (ref 0.1–1.0)
Monocytes Relative: 8 % (ref 3–12)
Neutro Abs: 3.5 10*3/uL (ref 1.7–7.7)
Neutrophils Relative %: 58 % (ref 43–77)
Platelets: 127 10*3/uL — ABNORMAL LOW (ref 150–400)
RBC: 3.5 MIL/uL — ABNORMAL LOW (ref 4.22–5.81)
RDW: 15.4 % (ref 11.5–15.5)
WBC: 6.1 10*3/uL (ref 4.0–10.5)

## 2014-02-01 LAB — HEPATIC FUNCTION PANEL
ALT: 9 U/L (ref 0–53)
AST: 16 U/L (ref 0–37)
Albumin: 3.7 g/dL (ref 3.5–5.2)
Alkaline Phosphatase: 57 U/L (ref 39–117)
Bilirubin, Direct: 0.1 mg/dL (ref 0.0–0.3)
Indirect Bilirubin: 0.3 mg/dL (ref 0.2–1.2)
Total Bilirubin: 0.4 mg/dL (ref 0.2–1.2)
Total Protein: 5.9 g/dL — ABNORMAL LOW (ref 6.0–8.3)

## 2014-02-01 LAB — TSH: TSH: 1.957 u[IU]/mL (ref 0.350–4.500)

## 2014-02-01 LAB — HEMOGLOBIN A1C
Hgb A1c MFr Bld: 6.7 % — ABNORMAL HIGH (ref <5.7)
Mean Plasma Glucose: 146 mg/dL — ABNORMAL HIGH (ref <117)

## 2014-02-01 NOTE — Addendum Note (Signed)
Addended by: Brett Canales on: 02/01/2014 05:24 PM   Modules accepted: Orders

## 2014-02-02 ENCOUNTER — Encounter: Payer: Self-pay | Admitting: Internal Medicine

## 2014-02-02 ENCOUNTER — Ambulatory Visit (INDEPENDENT_AMBULATORY_CARE_PROVIDER_SITE_OTHER): Payer: Medicare Other | Admitting: Internal Medicine

## 2014-02-02 VITALS — BP 114/48 | HR 72 | Temp 98.1°F | Ht 70.0 in | Wt 242.0 lb

## 2014-02-02 DIAGNOSIS — N2581 Secondary hyperparathyroidism of renal origin: Secondary | ICD-10-CM | POA: Diagnosis not present

## 2014-02-02 DIAGNOSIS — I70209 Unspecified atherosclerosis of native arteries of extremities, unspecified extremity: Secondary | ICD-10-CM | POA: Diagnosis not present

## 2014-02-02 DIAGNOSIS — Z0000 Encounter for general adult medical examination without abnormal findings: Secondary | ICD-10-CM | POA: Diagnosis not present

## 2014-02-02 DIAGNOSIS — E785 Hyperlipidemia, unspecified: Secondary | ICD-10-CM

## 2014-02-02 DIAGNOSIS — E039 Hypothyroidism, unspecified: Secondary | ICD-10-CM

## 2014-02-02 DIAGNOSIS — E119 Type 2 diabetes mellitus without complications: Secondary | ICD-10-CM

## 2014-02-02 DIAGNOSIS — J309 Allergic rhinitis, unspecified: Secondary | ICD-10-CM | POA: Diagnosis not present

## 2014-02-02 DIAGNOSIS — Z8719 Personal history of other diseases of the digestive system: Secondary | ICD-10-CM

## 2014-02-02 DIAGNOSIS — J019 Acute sinusitis, unspecified: Secondary | ICD-10-CM | POA: Diagnosis not present

## 2014-02-02 DIAGNOSIS — N186 End stage renal disease: Secondary | ICD-10-CM | POA: Diagnosis not present

## 2014-02-02 DIAGNOSIS — I10 Essential (primary) hypertension: Secondary | ICD-10-CM

## 2014-02-02 LAB — POCT URINALYSIS DIPSTICK
Bilirubin, UA: NEGATIVE
Blood, UA: NEGATIVE
Glucose, UA: NEGATIVE
Ketones, UA: NEGATIVE
Leukocytes, UA: NEGATIVE
Nitrite, UA: NEGATIVE
Spec Grav, UA: 1.01
Urobilinogen, UA: NEGATIVE
pH, UA: 5

## 2014-02-02 LAB — PSA, MEDICARE: PSA: 1.58 ng/mL (ref <=4.00)

## 2014-02-02 LAB — IRON AND TIBC
%SAT: 25 % (ref 20–55)
Iron: 79 ug/dL (ref 42–165)
TIBC: 310 ug/dL (ref 215–435)
UIBC: 231 ug/dL (ref 125–400)

## 2014-02-02 LAB — VITAMIN B12: Vitamin B-12: 237 pg/mL (ref 211–911)

## 2014-02-02 LAB — FOLATE: Folate: 10 ng/mL

## 2014-02-02 MED ORDER — METHYLPREDNISOLONE ACETATE 80 MG/ML IJ SUSP
80.0000 mg | Freq: Once | INTRAMUSCULAR | Status: AC
  Administered 2014-02-02: 80 mg via INTRAMUSCULAR

## 2014-02-02 MED ORDER — AZITHROMYCIN 250 MG PO TABS: ORAL_TABLET | ORAL | Status: DC

## 2014-02-02 NOTE — Progress Notes (Signed)
Subjective:    Patient ID: [name], male    DOB: [birth year], 73 y.o.   MRN: [id number]  HPI  73 year old 62 male with history of chronic kidney disease, DM, HTN, secondary hyperparathyroidism, peripheral vascular disease. 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 History of gout, hyperlipidemia, erectile dysfunction, allergic rhinitis, hypothyroidism.  He is followed by Dr. Jamal Maes, nephrologist and also at the [facility]. He's been followed by nephrology since 1992. Baseline creatinine initially was around 2.0 but has been a size 2.45 in July 2011. Now has increased to 3.80 and he has now had a shunt placed in his left arm.  Has annual ophthalmology exam.  He had a Cardiolite study done by Dr. Pernell Dupre in 2003 that was negative for ischemia. He also sees Dr. Karsten Ro, urologist for erectile dysfunction  And checks his prostate annually.  History of shingles 2007. Pneumovax immunization January 2011. 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 He has some left-sided abdominal pain from time to time consistent with diverticulitis that responded to Cipro and Flagyl. He had small bowel obstruction treated with surgical lysis of adhesions in 1996.  He takes insulin and is also on oral medication for diabetes. He takes allopurinol, aspirin, nifedipine, Lasix, Calcitrol, labetalol, Zocor, levothyroxin.  Best hemoglobin A1c was 6.7% in 2008. Worst I've seen  is 7.6% in April 2009.  Social History: Works part time [employer] about 20 hours a week. Married. Wife works for the [employer], [employer]. One adopted daughter in her 21s. Nonsmoker. Social alcohol consumption.  Family history: Father died of congestive heart failure at age 46. Mother died at age 63 of a brain aneurysm. He is an only child.  Review of Systems  Constitutional: Negative.   HENT:       Complaining of sinus congestion  Eyes: Negative.     Respiratory: Negative.   Cardiovascular: Negative for chest pain.  Gastrointestinal:       History of diverticulitis  Genitourinary:       History of erectile dysfunction  Allergic/Immunologic: Positive for environmental allergies.  Neurological: Negative.   Hematological: Negative.   Psychiatric/Behavioral: Negative.        Objective:   Physical Exam  Vitals reviewed. Constitutional: He is oriented to person, place, and time. He appears well-developed and well-nourished. No distress.  HENT:  Head: Normocephalic and atraumatic.  Right Ear: External ear normal.  Left Ear: External ear normal.  Mouth/Throat: Oropharynx is clear and moist. No oropharyngeal exudate.  Eyes: Conjunctivae and EOM are normal. Pupils are equal, round, and reactive to light. Right eye exhibits no discharge. Left eye exhibits no discharge. No scleral icterus.  Neck: Neck supple. No JVD present. No thyromegaly present.  Cardiovascular: Normal rate, regular rhythm, normal heart sounds and intact distal pulses.   No murmur heard. Pulmonary/Chest: Effort normal and breath sounds normal. No respiratory distress. He has no wheezes. He has no rales. He exhibits no tenderness.  Abdominal: Soft. Bowel sounds are normal. He exhibits no distension and no mass. There is no tenderness. There is no rebound and no guarding.  Genitourinary: Prostate normal.  Prostate slightly enlarged  Musculoskeletal: Normal range of motion. He exhibits no edema.  Lymphadenopathy:    He has no cervical adenopathy.  Neurological: He is alert and oriented to person, place, and time. He has normal reflexes. He displays normal reflexes. No cranial nerve deficit. Coordination normal.  Skin: Skin is  warm and dry. No rash noted. He is not diaphoretic.  Psychiatric: He has a normal mood and affect. His behavior is normal. Judgment and thought content normal.          Assessment & Plan:  Insulin-dependent diabetes mellitus-hemoglobin A1c  stable at 6.7%  Chronic kidney disease-now with shunt left arm but not on dialysis  Hypertension-stable  Hyperlipidemia-stable  Hypothyroidism-stable on thyroid replacement  Gout- on low-dose allopurinol  Secondary hyperparathyroidism  Allergic rhinitis-given Depo-Medrol IM today  Acute sinusitis-treat wit Zithromax  Plan: Treated with Zithromax Z-Pak and Depo-Medrol IM today. He is anemic . Anemia studies drawn. He has low normal B12 level in the low 200s and will be started on 1 cc IM B12 monthly. Also anemia could be related to chronic kidney disease. Iron studies are normal. Folate is normal. We will recheck B12 level at six-month recheck. He will return here in 6 months for office visit, hemoglobin A1c, TSH, and other labs as necessary. Continue to be followed closely by Dr. Lorrene Reid.      Subjective:   Patient presents for Medicare Annual/Subsequent preventive examination.  Review Past Medical/Family/Social: see above   Risk Factors  Current exercise habits: walks some Dietary issues discussed: low fat low carb   Cardiac risk factors: family history, CKD, DM  Depression Screen  (Note: if answer to either of the following is "Yes", a more complete depression screening is indicated)   Over the past two weeks, have you felt down, depressed or hopeless? No  Over the past two weeks, have you felt little interest or pleasure in doing things? No Have you lost interest or pleasure in daily life? No Do you often feel hopeless? No Do you cry easily over simple problems? No   Activities of Daily Living  In your present state of health, do you have any difficulty performing the following activities?:   Driving? No  Managing money? No  Feeding yourself? No  Getting from bed to chair? No  Climbing a flight of stairs? No  Preparing food and eating?: No  Bathing or showering? No  Getting dressed: No  Getting to the toilet? No  Using the toilet:No  Moving around from  place to place: No  In the past year have you fallen or had a near fall?:No  Are you sexually active?   Do you have more than one partner? No   Hearing Difficulties: No  Do you often ask people to speak up or repeat themselves? No  Do you experience ringing or noises in your ears? No  Do you have difficulty understanding soft or whispered voices? No  Do you feel that you have a problem with memory? No Do you often misplace items? No    Home Safety:  Do you have a smoke alarm at your residence? Yes Do you have grab bars in the bathroom? no Do you have throw rugs in your house? Not asked   Cognitive Testing  Alert? Yes Normal Appearance?Yes  Oriented to person? Yes Place? Yes  Time? Yes  Recall of three objects? Yes  Can perform simple calculations? Yes  Displays appropriate judgment?Yes  Can read the correct time from a watch face?Yes   List the Names of Other Physician/Practitioners you currently use:  See referral list for the physicians patient is currently seeing.  Dr. Lorrene Reid Urologist Dr. Scot Dock- vascular surgeon   Review of Systems:see above   Objective:     General appearance: Appears stated age and mildly obese  Head: Normocephalic, without obvious abnormality, atraumatic  Eyes: conj clear, EOMi PEERLA  Ears: normal TM's and external ear canals both ears  Nose: Nares normal. Septum midline. Mucosa normal. No drainage or sinus tenderness.  Throat: lips, mucosa, and tongue normal; teeth and gums normal  Neck: no adenopathy, no carotid bruit, no JVD, supple, symmetrical, trachea midline and thyroid not enlarged, symmetric, no tenderness/mass/nodules  No CVA tenderness.  Lungs: clear to auscultation bilaterally  Breasts: normal appearance, no masses or tenderness Heart: regular rate and rhythm, S1, S2 normal, no murmur, click, rub or gallop  Abdomen: soft, non-tender; bowel sounds normal; no masses, no organomegaly  Musculoskeletal: ROM normal in all joints,  no crepitus, no deformity, Normal muscle strengthen. Back  is symmetric, no curvature. Skin: Skin color, texture, turgor normal. No rashes or lesions  Lymph nodes: Cervical, supraclavicular, and axillary nodes normal.  Neurologic: CN 2 -12 Normal, Normal symmetric reflexes. Normal coordination and gait  Psych: Alert & Oriented x 3, Mood appear stable.   Left arm AV fistula Assessment:    Annual wellness medicare exam   Plan:    During the course of the visit the patient was educated and counseled about appropriate screening and preventive services including:   PSA is normal     Patient Instructions (the written plan) was given to the patient.  Medicare Attestation  I have personally reviewed:  The patient's medical and social history  Their use of alcohol, tobacco or illicit drugs  Their current medications and supplements  The patient's functional ability including ADLs,fall risks, home safety risks, cognitive, and hearing and visual impairment  Diet and physical activities  Evidence for depression or mood disorders  The patient's weight, height, BMI, and visual acuity have been recorded in the chart. I have made referrals, counseling, and provided education to the patient based on review of the above and I have provided the patient with a written personalized care plan for preventive services.

## 2014-02-03 ENCOUNTER — Encounter: Payer: Self-pay | Admitting: Internal Medicine

## 2014-02-03 ENCOUNTER — Encounter (HOSPITAL_COMMUNITY): Payer: Self-pay | Admitting: Pharmacy Technician

## 2014-02-03 NOTE — Progress Notes (Signed)
Patient informed. Will come 02/04/2014 for first injection.

## 2014-02-03 NOTE — Patient Instructions (Signed)
Take Zithromax as prescribed. Depo-Medrol 80 mg IM given today. Return in 6 months. Anemia studies pending.

## 2014-02-04 ENCOUNTER — Ambulatory Visit (INDEPENDENT_AMBULATORY_CARE_PROVIDER_SITE_OTHER): Payer: Medicare Other | Admitting: Internal Medicine

## 2014-02-04 DIAGNOSIS — E538 Deficiency of other specified B group vitamins: Secondary | ICD-10-CM

## 2014-02-04 MED ORDER — CYANOCOBALAMIN 1000 MCG/ML IJ SOLN
1000.0000 ug | Freq: Once | INTRAMUSCULAR | Status: AC
  Administered 2014-02-04: 1000 ug via INTRAMUSCULAR

## 2014-02-05 DIAGNOSIS — E1139 Type 2 diabetes mellitus with other diabetic ophthalmic complication: Secondary | ICD-10-CM | POA: Diagnosis not present

## 2014-02-05 DIAGNOSIS — H43819 Vitreous degeneration, unspecified eye: Secondary | ICD-10-CM | POA: Diagnosis not present

## 2014-02-05 DIAGNOSIS — E11329 Type 2 diabetes mellitus with mild nonproliferative diabetic retinopathy without macular edema: Secondary | ICD-10-CM | POA: Diagnosis not present

## 2014-02-05 DIAGNOSIS — Z961 Presence of intraocular lens: Secondary | ICD-10-CM | POA: Diagnosis not present

## 2014-02-05 LAB — HM DIABETES EYE EXAM

## 2014-02-08 ENCOUNTER — Encounter (HOSPITAL_COMMUNITY): Payer: Self-pay | Admitting: *Deleted

## 2014-02-08 MED ORDER — SODIUM CHLORIDE 0.9 % IV SOLN: INTRAVENOUS | Status: DC

## 2014-02-08 MED ORDER — CIPROFLOXACIN IN D5W 400 MG/200ML IV SOLN
400.0000 mg | INTRAVENOUS | Status: AC
  Administered 2014-02-09: 400 mg via INTRAVENOUS
  Filled 2014-02-08: qty 200

## 2014-02-08 MED ORDER — CHLORHEXIDINE GLUCONATE CLOTH 2 % EX PADS: 6.0000 | MEDICATED_PAD | Freq: Once | CUTANEOUS | Status: DC

## 2014-02-08 NOTE — Progress Notes (Signed)
02/08/14 1356  OBSTRUCTIVE SLEEP APNEA  Have you ever been diagnosed with sleep apnea through a sleep study? No  Do you snore loudly (loud enough to be heard through closed doors)?  1  Do you often feel tired, fatigued, or sleepy during the daytime? 1  Do you have, or are you being treated for high blood pressure? 1  BMI more than 35 kg/m2? 0  Age over 73 years old? 1  Neck circumference greater than 40 cm/16 inches? 1 (17.5)  Gender: 1  Obstructive Sleep Apnea Score 6  Score 4 or greater  No PCP

## 2014-02-09 ENCOUNTER — Ambulatory Visit (HOSPITAL_COMMUNITY)
Admission: RE | Admit: 2014-02-09 | Discharge: 2014-02-09 | Disposition: A | Discharge status: Home / Self Care | Payer: Medicare Other | Source: Ambulatory Visit | Attending: Vascular Surgery | Admitting: Vascular Surgery

## 2014-02-09 ENCOUNTER — Ambulatory Visit (HOSPITAL_COMMUNITY): Payer: Medicare Other | Admitting: Anesthesiology

## 2014-02-09 ENCOUNTER — Other Ambulatory Visit: Payer: Medicare Other | Admitting: *Deleted

## 2014-02-09 ENCOUNTER — Other Ambulatory Visit: Payer: Self-pay | Admitting: *Deleted

## 2014-02-09 ENCOUNTER — Encounter (HOSPITAL_COMMUNITY)
Admission: RE | Disposition: A | Discharge status: Home / Self Care | Payer: Self-pay | Source: Ambulatory Visit | Attending: Vascular Surgery

## 2014-02-09 ENCOUNTER — Encounter (HOSPITAL_COMMUNITY): Payer: Self-pay | Admitting: Anesthesiology

## 2014-02-09 ENCOUNTER — Encounter (HOSPITAL_COMMUNITY): Payer: Medicare Other | Admitting: Anesthesiology

## 2014-02-09 ENCOUNTER — Telehealth: Payer: Self-pay | Admitting: Vascular Surgery

## 2014-02-09 DIAGNOSIS — K219 Gastro-esophageal reflux disease without esophagitis: Secondary | ICD-10-CM | POA: Insufficient documentation

## 2014-02-09 DIAGNOSIS — Z794 Long term (current) use of insulin: Secondary | ICD-10-CM | POA: Insufficient documentation

## 2014-02-09 DIAGNOSIS — Z48812 Encounter for surgical aftercare following surgery on the circulatory system: Secondary | ICD-10-CM

## 2014-02-09 DIAGNOSIS — N184 Chronic kidney disease, stage 4 (severe): Secondary | ICD-10-CM | POA: Insufficient documentation

## 2014-02-09 DIAGNOSIS — D649 Anemia, unspecified: Secondary | ICD-10-CM | POA: Insufficient documentation

## 2014-02-09 DIAGNOSIS — I252 Old myocardial infarction: Secondary | ICD-10-CM | POA: Diagnosis not present

## 2014-02-09 DIAGNOSIS — I129 Hypertensive chronic kidney disease with stage 1 through stage 4 chronic kidney disease, or unspecified chronic kidney disease: Secondary | ICD-10-CM | POA: Insufficient documentation

## 2014-02-09 DIAGNOSIS — Z992 Dependence on renal dialysis: Secondary | ICD-10-CM | POA: Insufficient documentation

## 2014-02-09 DIAGNOSIS — N186 End stage renal disease: Secondary | ICD-10-CM

## 2014-02-09 DIAGNOSIS — I739 Peripheral vascular disease, unspecified: Secondary | ICD-10-CM | POA: Diagnosis not present

## 2014-02-09 DIAGNOSIS — G4730 Sleep apnea, unspecified: Secondary | ICD-10-CM | POA: Diagnosis not present

## 2014-02-09 DIAGNOSIS — E119 Type 2 diabetes mellitus without complications: Secondary | ICD-10-CM | POA: Diagnosis not present

## 2014-02-09 DIAGNOSIS — T82898A Other specified complication of vascular prosthetic devices, implants and grafts, initial encounter: Secondary | ICD-10-CM

## 2014-02-09 DIAGNOSIS — Z87891 Personal history of nicotine dependence: Secondary | ICD-10-CM | POA: Insufficient documentation

## 2014-02-09 DIAGNOSIS — Z4931 Encounter for adequacy testing for hemodialysis: Secondary | ICD-10-CM

## 2014-02-09 HISTORY — DX: Gout, unspecified: M10.9

## 2014-02-09 HISTORY — DX: Hypothyroidism, unspecified: E03.9

## 2014-02-09 HISTORY — DX: Gastro-esophageal reflux disease without esophagitis: K21.9

## 2014-02-09 HISTORY — DX: Other seasonal allergic rhinitis: J30.2

## 2014-02-09 HISTORY — PX: REVISON OF ARTERIOVENOUS FISTULA: SHX6074

## 2014-02-09 LAB — GLUCOSE, CAPILLARY
Glucose-Capillary: 113 mg/dL — ABNORMAL HIGH (ref 70–99)
Glucose-Capillary: 103 mg/dL — ABNORMAL HIGH (ref 70–99)
Glucose-Capillary: 141 mg/dL — ABNORMAL HIGH (ref 70–99)

## 2014-02-09 LAB — POCT I-STAT 4, (NA,K, GLUC, HGB,HCT)
Glucose, Bld: 113 mg/dL — ABNORMAL HIGH (ref 70–99)
HCT: 36 % — ABNORMAL LOW (ref 39.0–52.0)
Hemoglobin: 12.2 g/dL — ABNORMAL LOW (ref 13.0–17.0)
Potassium: 4.5 mEq/L (ref 3.7–5.3)
Sodium: 143 mEq/L (ref 137–147)

## 2014-02-09 SURGERY — REVISON OF ARTERIOVENOUS FISTULA
Anesthesia: Monitor Anesthesia Care | Site: Arm Upper | Laterality: Left

## 2014-02-09 MED ORDER — SODIUM CHLORIDE 0.9 % IR SOLN
Status: DC | PRN
  Administered 2014-02-09: 1000 mL

## 2014-02-09 MED ORDER — PROTAMINE SULFATE 10 MG/ML IV SOLN
INTRAVENOUS | Status: AC
  Filled 2014-02-09: qty 5

## 2014-02-09 MED ORDER — LIDOCAINE-EPINEPHRINE (PF) 1 %-1:200000 IJ SOLN
INTRAMUSCULAR | Status: DC | PRN
Start: 2014-02-09 — End: 2014-02-09
  Administered 2014-02-09: 30 mL

## 2014-02-09 MED ORDER — PROPOFOL 10 MG/ML IV BOLUS
INTRAVENOUS | Status: AC
  Filled 2014-02-09: qty 20

## 2014-02-09 MED ORDER — PROPOFOL INFUSION 10 MG/ML OPTIME
INTRAVENOUS | Status: DC | PRN
  Administered 2014-02-09: 50 ug/kg/min via INTRAVENOUS

## 2014-02-09 MED ORDER — FENTANYL CITRATE 0.05 MG/ML IJ SOLN
INTRAMUSCULAR | Status: AC
  Filled 2014-02-09: qty 5

## 2014-02-09 MED ORDER — CHLORHEXIDINE GLUCONATE 4 % EX LIQD
60.0000 mL | Freq: Once | CUTANEOUS | Status: DC
  Filled 2014-02-09: qty 60

## 2014-02-09 MED ORDER — ONDANSETRON HCL 4 MG/2ML IJ SOLN: 4.0000 mg | Freq: Once | INTRAMUSCULAR | Status: DC | PRN

## 2014-02-09 MED ORDER — SODIUM CHLORIDE 0.9 % IV SOLN
INTRAVENOUS | Status: DC
  Administered 2014-02-09: 09:00:00 via INTRAVENOUS

## 2014-02-09 MED ORDER — OXYCODONE HCL 5 MG/5ML PO SOLN: 5.0000 mg | Freq: Once | ORAL | Status: DC | PRN

## 2014-02-09 MED ORDER — OXYCODONE HCL 5 MG PO TABS: 5.0000 mg | ORAL_TABLET | Freq: Once | ORAL | Status: DC | PRN

## 2014-02-09 MED ORDER — PROPOFOL 10 MG/ML IV BOLUS
INTRAVENOUS | Status: DC | PRN
  Administered 2014-02-09 (×2): 20 mg via INTRAVENOUS

## 2014-02-09 MED ORDER — OXYCODONE-ACETAMINOPHEN 5-325 MG PO TABS: 1.0000 | ORAL_TABLET | ORAL | Status: DC | PRN

## 2014-02-09 MED ORDER — SODIUM CHLORIDE 0.9 % IV SOLN
INTRAVENOUS | Status: DC | PRN
Start: 2014-02-09 — End: 2014-02-09
  Administered 2014-02-09: 10:00:00 via INTRAVENOUS

## 2014-02-09 MED ORDER — PROTAMINE SULFATE 10 MG/ML IV SOLN
INTRAVENOUS | Status: DC | PRN
  Administered 2014-02-09: 50 mg via INTRAVENOUS

## 2014-02-09 MED ORDER — ACETAMINOPHEN 160 MG/5ML PO SOLN
325.0000 mg | ORAL | Status: DC | PRN
  Filled 2014-02-09: qty 20.3

## 2014-02-09 MED ORDER — HEPARIN SODIUM (PORCINE) 1000 UNIT/ML IJ SOLN
INTRAMUSCULAR | Status: DC | PRN
  Administered 2014-02-09: 8000 [IU] via INTRAVENOUS

## 2014-02-09 MED ORDER — ONDANSETRON HCL 4 MG/2ML IJ SOLN
INTRAMUSCULAR | Status: DC | PRN
  Administered 2014-02-09: 4 mg via INTRAVENOUS

## 2014-02-09 MED ORDER — HEPARIN SODIUM (PORCINE) 5000 UNIT/ML IJ SOLN
INTRAMUSCULAR | Status: DC | PRN
  Administered 2014-02-09: 11:00:00

## 2014-02-09 MED ORDER — THROMBIN 20000 UNITS EX SOLR
CUTANEOUS | Status: AC
  Filled 2014-02-09: qty 20000

## 2014-02-09 MED ORDER — VANCOMYCIN HCL IN DEXTROSE 1-5 GM/200ML-% IV SOLN
1000.0000 mg | INTRAVENOUS | Status: AC
  Administered 2014-02-09: 1000 mg via INTRAVENOUS
  Filled 2014-02-09: qty 200

## 2014-02-09 MED ORDER — ONDANSETRON HCL 4 MG/2ML IJ SOLN
INTRAMUSCULAR | Status: AC
  Filled 2014-02-09: qty 2

## 2014-02-09 MED ORDER — ACETAMINOPHEN 325 MG PO TABS: 325.0000 mg | ORAL_TABLET | ORAL | Status: DC | PRN

## 2014-02-09 MED ORDER — FENTANYL CITRATE 0.05 MG/ML IJ SOLN: 25.0000 ug | INTRAMUSCULAR | Status: DC | PRN

## 2014-02-09 MED ORDER — LIDOCAINE HCL (PF) 1 % IJ SOLN
INTRAMUSCULAR | Status: AC
  Filled 2014-02-09: qty 30

## 2014-02-09 MED ORDER — HEPARIN SODIUM (PORCINE) 1000 UNIT/ML IJ SOLN
INTRAMUSCULAR | Status: AC
  Filled 2014-02-09: qty 1

## 2014-02-09 MED ORDER — FENTANYL CITRATE 0.05 MG/ML IJ SOLN
INTRAMUSCULAR | Status: DC | PRN
  Administered 2014-02-09 (×2): 50 ug via INTRAVENOUS

## 2014-02-09 SURGICAL SUPPLY — 45 items
ADH SKN CLS APL DERMABOND .7 (GAUZE/BANDAGES/DRESSINGS) ×1
ADH SKN CLS LQ APL DERMABOND (GAUZE/BANDAGES/DRESSINGS) ×1
BLADE 10 SAFETY STRL DISP (BLADE) ×3 IMPLANT
CANISTER SUCTION 2500CC (MISCELLANEOUS) ×3 IMPLANT
CLIP TI MEDIUM 6 (CLIP) ×3 IMPLANT
CLIP TI WIDE RED SMALL 6 (CLIP) ×5 IMPLANT
COVER PROBE W GEL 5X96 (DRAPES) ×3 IMPLANT
COVER SURGICAL LIGHT HANDLE (MISCELLANEOUS) ×3 IMPLANT
DERMABOND ADHESIVE PROPEN (GAUZE/BANDAGES/DRESSINGS) ×2
DERMABOND ADVANCED (GAUZE/BANDAGES/DRESSINGS) ×2
DERMABOND ADVANCED .7 DNX12 (GAUZE/BANDAGES/DRESSINGS) ×1 IMPLANT
DERMABOND ADVANCED .7 DNX6 (GAUZE/BANDAGES/DRESSINGS) IMPLANT
ELECT REM PT RETURN 9FT ADLT (ELECTROSURGICAL) ×3
ELECTRODE REM PT RTRN 9FT ADLT (ELECTROSURGICAL) ×1 IMPLANT
GEL ULTRASOUND 20GR AQUASONIC (MISCELLANEOUS) IMPLANT
GLOVE BIO SURGEON STRL SZ 6.5 (GLOVE) ×7 IMPLANT
GLOVE BIO SURGEON STRL SZ7.5 (GLOVE) ×3 IMPLANT
GLOVE BIO SURGEONS STRL SZ 6.5 (GLOVE) ×7
GLOVE BIOGEL PI IND STRL 6.5 (GLOVE) IMPLANT
GLOVE BIOGEL PI IND STRL 7.0 (GLOVE) IMPLANT
GLOVE BIOGEL PI IND STRL 8 (GLOVE) ×1 IMPLANT
GLOVE BIOGEL PI INDICATOR 6.5 (GLOVE) ×6
GLOVE BIOGEL PI INDICATOR 7.0 (GLOVE) ×2
GLOVE BIOGEL PI INDICATOR 8 (GLOVE) ×2
GOWN STRL REUS W/ TWL LRG LVL3 (GOWN DISPOSABLE) ×3 IMPLANT
GOWN STRL REUS W/TWL LRG LVL3 (GOWN DISPOSABLE) ×9
KIT BASIN OR (CUSTOM PROCEDURE TRAY) ×3 IMPLANT
KIT ROOM TURNOVER OR (KITS) ×3 IMPLANT
NS IRRIG 1000ML POUR BTL (IV SOLUTION) ×3 IMPLANT
PACK CV ACCESS (CUSTOM PROCEDURE TRAY) ×3 IMPLANT
PAD ARMBOARD 7.5X6 YLW CONV (MISCELLANEOUS) ×6 IMPLANT
SPONGE GAUZE 4X4 12PLY (GAUZE/BANDAGES/DRESSINGS) ×3 IMPLANT
SPONGE SURGIFOAM ABS GEL 100 (HEMOSTASIS) IMPLANT
SUT ETHILON 3 0 PS 1 (SUTURE) IMPLANT
SUT PROLENE 6 0 BV (SUTURE) ×9 IMPLANT
SUT SILK 0 TIES 10X30 (SUTURE) ×3 IMPLANT
SUT VIC AB 3-0 SH 27 (SUTURE) ×3
SUT VIC AB 3-0 SH 27X BRD (SUTURE) ×1 IMPLANT
SUT VICRYL 4-0 PS2 18IN ABS (SUTURE) ×5 IMPLANT
SWAB COLLECTION DEVICE MRSA (MISCELLANEOUS) IMPLANT
TOWEL OR 17X24 6PK STRL BLUE (TOWEL DISPOSABLE) ×3 IMPLANT
TOWEL OR 17X26 10 PK STRL BLUE (TOWEL DISPOSABLE) ×3 IMPLANT
TUBE ANAEROBIC SPECIMEN COL (MISCELLANEOUS) IMPLANT
UNDERPAD 30X30 INCONTINENT (UNDERPADS AND DIAPERS) ×3 IMPLANT
WATER STERILE IRR 1000ML POUR (IV SOLUTION) ×3 IMPLANT

## 2014-02-09 NOTE — Telephone Encounter (Addendum)
Message copied by Gena Fray on Tue Feb 09, 2014  2:03 PM ------      Message from: Mena Goes      Created: Tue Feb 09, 2014  1:50 PM      Regarding: Schedule                   ----- Message -----         From: Angelia Mould, MD         Sent: 02/09/2014   1:24 PM           To: Vvs Charge Pool      Subject: charge and f/u                                           PROCEDURE: Revision of left brachiocephalic AV fistula by resecting redundant segment of vein with primary anastomosis            SURGEON: Judeth Cornfield. Scot Dock, MD, FACS            ASSIST: Silva Bandy, [facility]            He will need a follow up visit in 6-8 weeks with the duplex of his fistula at that time. Thank you. CD ------  02/09/14: left detailed message for pt, dpm

## 2014-02-09 NOTE — Op Note (Signed)
    NAME: [name]    MRN: [id number] DOB: Nov 30, 1940    DATE OF OPERATION: 02/09/2014  PREOP DIAGNOSIS: Stage IV chronic kidney disease  POSTOP DIAGNOSIS: Same  PROCEDURE: Revision of left brachiocephalic AV fistula by resecting redundant segment of vein with primary anastomosis  SURGEON: Judeth Cornfield. Scot Dock, MD, FACS  ASSIST: Silva Bandy, [facility]  ANESTHESIA: local with sedation   EBL: minimal  INDICATIONS: [name] is a 73 y.o. male had a left brachiocephalic AV fistula in March. A follow duplex the vein was noted to be significantly redundant. There were also some competing branches. He presents for revision of his fistula.  FINDINGS: the fistula was markedly tortuous. I resected redundant segment and anastomosed vein back into the end. Several small competing branches were ligated.  TECHNIQUE: The patient was taken to the operating room and sedated by anesthesia. The left upper extremity was prepped and draped in usual sterile fashion. After the skin was anesthetized with 1% lidocaine 3 incisions were made over the fistula from just above the antecubital level to the mid upper arm. The vein was completely mobilized for this entire length all branches divided between clips and 3-0 silk ties. Of note there was significant inflammation around the vein and adherent adipose tissue making the dissection somewhat difficult. However I was able to fully mobilize the vein. The patient was then heparinized. I pulled the redundancy out of the vein and then clamped the vein at both ends. The redundant segment was excised. Each end was spatulated. The vein was sewn back into in with 2 continuous 6-0 Prolene sutures. At the completion was a good thrill in the fistula. Hemostasis was obtained the wound the deep layer was closed with interrupted 30 Vicryls. The skin was closed with 4-0 subcuticular stitch. Dermabond was applied. The patient tolerated the procedure well transferred to recovery in  stable condition. All needle and sponge counts were correct.  Deitra Mayo, MD, FACS [facility]  DATE OF DICTATION:   02/09/2014

## 2014-02-09 NOTE — Transfer of Care (Signed)
Immediate Anesthesia Transfer of Care Note  Patient: [name]  Procedure(s) Performed: Procedure(s): REVISON OF LEFT ARTERIOVENOUS FISTULA - RESECTION OF RENDUNDANT VEIN (Left)  Patient Location: PACU  Anesthesia Type:MAC  Level of Consciousness: awake, alert , oriented and sedated  Airway & Oxygen Therapy: Patient Spontanous Breathing and Patient connected to nasal cannula oxygen  Post-op Assessment: Report given to PACU RN, Post -op Vital signs reviewed and stable and Patient moving all extremities  Post vital signs: Reviewed and stable  Complications: No apparent anesthesia complications

## 2014-02-09 NOTE — Anesthesia Postprocedure Evaluation (Signed)
  Anesthesia Post-op Note  Patient: [name]  Procedure(s) Performed: Procedure(s): REVISON OF LEFT ARTERIOVENOUS FISTULA - RESECTION OF RENDUNDANT VEIN (Left)  Patient Location: PACU  Anesthesia Type:MAC  Level of Consciousness: awake and alert   Airway and Oxygen Therapy: Patient Spontanous Breathing  Post-op Pain: none  Post-op Assessment: Post-op Vital signs reviewed, Patient's Cardiovascular Status Stable, Respiratory Function Stable, Patent Airway, No signs of Nausea or vomiting and Pain level controlled  Post-op Vital Signs: Reviewed and stable  Last Vitals:  Filed Vitals:   02/09/14 1341  BP: 135/58  Pulse: 51  Temp:   Resp: 16    Complications: No apparent anesthesia complications

## 2014-02-09 NOTE — Interval H&P Note (Signed)
History and Physical Interval Note:  02/09/2014 10:14 AM  [name]  has presented today for surgery, with the diagnosis of End Stage Renal Disease Complications of left arm arteriovenous fistula   The various methods of treatment have been discussed with the patient and family. After consideration of risks, benefits and other options for treatment, the patient has consented to  Procedure(s): REVISON OF LEFT ARTERIOVENOUS FISTULA - RESECTION OF RENDUNDANT VEIN (Left) LIGATION OF COMPETING BRANCHES OF ARTERIOVENOUS FISTULA (Left) as a surgical intervention .  The patient's history has been reviewed, patient examined, no change in status, stable for surgery.  I have reviewed the patient's chart and labs.  Questions were answered to the patient's satisfaction.     Angelia Mould

## 2014-02-09 NOTE — Anesthesia Preprocedure Evaluation (Addendum)
Anesthesia Evaluation  Patient identified by MRN, date of birth, ID band Patient awake    Reviewed: Allergy & Precautions, H&P , NPO status , Patient's Chart, lab work & pertinent test results  History of Anesthesia Complications Negative for: history of anesthetic complications  Airway Mallampati: II TM Distance: >3 FB Neck ROM: Full    Dental  (+) Teeth Intact, Partial Upper   Pulmonary neg sleep apnea, neg COPDneg recent URI, former smoker,  breath sounds clear to auscultation        Cardiovascular hypertension, Pt. on medications - angina+ Peripheral Vascular Disease - Past MI - dysrhythmias - Valvular Problems/MurmursRhythm:Regular  S/p open AAA repair   Neuro/Psych negative neurological ROS  negative psych ROS   GI/Hepatic Neg liver ROS, GERD-  Medicated and Controlled,  Endo/Other  diabetes, Type 2, Insulin DependentHypothyroidism   Renal/GU ESRFRenal disease     Musculoskeletal   Abdominal   Peds  Hematology  (+) anemia ,   Anesthesia Other Findings   Reproductive/Obstetrics                         Anesthesia Physical Anesthesia Plan  ASA: III  Anesthesia Plan: MAC   Post-op Pain Management:    Induction: Intravenous  Airway Management Planned: Natural Airway and Simple Face Mask  Additional Equipment: None  Intra-op Plan:   Post-operative Plan:   Informed Consent: I have reviewed the patients History and Physical, chart, labs and discussed the procedure including the risks, benefits and alternatives for the proposed anesthesia with the patient or authorized representative who has indicated his/her understanding and acceptance.   Dental advisory given  Plan Discussed with: CRNA and Surgeon  Anesthesia Plan Comments:         Anesthesia Quick Evaluation

## 2014-02-09 NOTE — H&P (View-Only) (Signed)
   Patient name: [name] MRN: [id number] DOB: [birth year] Sex: male  REASON FOR VISIT: Follow up after left brachiocephalic AV fistula  HPI: [name] is a 73 y.o. male with stage IV chronic kidney disease. He had a left brachiocephalic AV fistula placed on 12/01/2013. He comes in for a 6 week follow up visit. He has no specific complaints. He denies pain or paresthesias in his left upper extremity.  REVIEW OF SYSTEMS: [name] ] denotes positive finding; [  ] denotes negative finding  CARDIOVASCULAR:  [ ]  chest pain   [ ]  dyspnea on exertion    CONSTITUTIONAL:  [ ]  fever   [ ]  chills  PHYSICAL EXAM: Filed Vitals:   01/20/14 1026  BP: 127/52  Pulse: 55  Height: 5' 10.5" (1.791 m)  Weight: 240 lb 9 oz (109.117 kg)  SpO2: 100%   Body mass index is 34.02 kg/(m^2). GENERAL: The patient is a well-nourished male, in no acute distress. The vital signs are documented above. CARDIOVASCULAR: There is a regular rate and rhythm. PULMONARY: There is good air exchange bilaterally without wheezing or rales. The fistula has a good thrill but is very tortuous. He has a palpable left radial pulse.  DUPLEX OF AV FISTULA: The left AV fistula is widely patent. Diameters range from 0.47-0.85 cm. Depths range from 0.36 cm-0.50 cm. The fistula is noted to be somewhat tortuous just above the antecubital level. Several competing branches are identified. There were no areas of significantly increased velocity noted.  MEDICAL ISSUES:  End stage renal disease This fistula has reasonable diameters however I do not think it would be usable for access because of the tortuosity of the vein above the antecubital level. In addition, there are some competing branches which can be diverting flow and preventing continued maturation of the fistula. I have recommended revision of the fistula which would involve mobilizing the vein and resecting a redundant segment in addition to ligating the competing branches. The  patient still works in these 2 look at his work schedule before deciding when he can schedule surgery. We have reviewed the indications for the procedure, specifically that I do not think the vein could be cannulated because of the tortuosity. We have also reviewed the potential complications of surgery and he is agreeable to proceed.   Angelia Mould [facility] [place] Beeper: [phone]

## 2014-02-09 NOTE — Discharge Instructions (Signed)
° ° °  02/09/2014 [name] [id number] [birth year]  Surgeon(s): Angelia Mould, MD  Procedure(s): REVISON OF LEFT ARTERIOVENOUS FISTULA - RESECTION OF RENDUNDANT VEIN LIGATION OF COMPETING BRANCHES OF ARTERIOVENOUS FISTULA  x Do not stick graft for 8 weeks

## 2014-02-12 ENCOUNTER — Encounter (HOSPITAL_COMMUNITY): Payer: Self-pay | Admitting: Vascular Surgery

## 2014-02-24 ENCOUNTER — Telehealth: Payer: Self-pay

## 2014-02-24 NOTE — Telephone Encounter (Signed)
Patient was evaluated in pre-op for possible sleep apnea. He is going to think about seeing a pulmonologist and having a sleep study done and will let us know.

## 2014-03-05 ENCOUNTER — Ambulatory Visit (INDEPENDENT_AMBULATORY_CARE_PROVIDER_SITE_OTHER): Payer: Medicare Other | Admitting: Internal Medicine

## 2014-03-05 DIAGNOSIS — E538 Deficiency of other specified B group vitamins: Secondary | ICD-10-CM

## 2014-03-05 MED ORDER — CYANOCOBALAMIN 1000 MCG/ML IJ SOLN
1000.0000 ug | Freq: Once | INTRAMUSCULAR | Status: AC
  Administered 2014-03-05: 1000 ug via INTRAMUSCULAR

## 2014-03-24 ENCOUNTER — Other Ambulatory Visit (HOSPITAL_COMMUNITY): Payer: Medicare Other

## 2014-03-24 ENCOUNTER — Encounter: Payer: Medicare Other | Admitting: Vascular Surgery

## 2014-03-31 ENCOUNTER — Other Ambulatory Visit: Payer: Self-pay | Admitting: Vascular Surgery

## 2014-03-31 ENCOUNTER — Ambulatory Visit (HOSPITAL_COMMUNITY)
Admission: RE | Admit: 2014-03-31 | Discharge: 2014-03-31 | Disposition: A | Discharge status: Home / Self Care | Payer: Medicare Other | Source: Ambulatory Visit | Attending: Vascular Surgery | Admitting: Vascular Surgery

## 2014-03-31 DIAGNOSIS — Z48812 Encounter for surgical aftercare following surgery on the circulatory system: Secondary | ICD-10-CM

## 2014-03-31 DIAGNOSIS — N184 Chronic kidney disease, stage 4 (severe): Secondary | ICD-10-CM | POA: Insufficient documentation

## 2014-03-31 DIAGNOSIS — N186 End stage renal disease: Secondary | ICD-10-CM

## 2014-04-02 ENCOUNTER — Ambulatory Visit (INDEPENDENT_AMBULATORY_CARE_PROVIDER_SITE_OTHER): Payer: Medicare Other | Admitting: Internal Medicine

## 2014-04-02 DIAGNOSIS — E538 Deficiency of other specified B group vitamins: Secondary | ICD-10-CM | POA: Diagnosis not present

## 2014-04-02 MED ORDER — CYANOCOBALAMIN 1000 MCG/ML IJ SOLN
1000.0000 ug | Freq: Once | INTRAMUSCULAR | Status: AC
  Administered 2014-04-02: 1000 ug via INTRAMUSCULAR

## 2014-04-06 ENCOUNTER — Encounter: Payer: Self-pay | Admitting: Vascular Surgery

## 2014-04-07 ENCOUNTER — Encounter: Payer: Self-pay | Admitting: Vascular Surgery

## 2014-04-07 ENCOUNTER — Other Ambulatory Visit: Payer: Self-pay

## 2014-04-07 ENCOUNTER — Ambulatory Visit (INDEPENDENT_AMBULATORY_CARE_PROVIDER_SITE_OTHER): Payer: Self-pay | Admitting: Vascular Surgery

## 2014-04-07 VITALS — BP 150/57 | HR 58 | Temp 97.9°F | Resp 20 | Ht 70.5 in | Wt 230.0 lb

## 2014-04-07 DIAGNOSIS — N184 Chronic kidney disease, stage 4 (severe): Secondary | ICD-10-CM | POA: Insufficient documentation

## 2014-04-07 NOTE — Progress Notes (Signed)
   Patient name: [name] MRN: [id number] DOB: October 20, 1940 Sex: male  REASON FOR VISIT: follow up a left brachiocephalic AV fistula.  HPI: [name] is a 73 y.o. male who had a left brachiocephalic AV fistula placed on 02/09/2014. He comes in for a routine follow up visit. He is not yet on dialysis. He has no specific complaints.  REVIEW OF SYSTEMS: [name] ] denotes positive finding; [  ] denotes negative finding  CARDIOVASCULAR:  [ ]  chest pain   [ ]  dyspnea on exertion    CONSTITUTIONAL:  [ ]  fever   [ ]  chills  PHYSICAL EXAM: Filed Vitals:   04/07/14 1538  BP: 150/57  Pulse: 58  Temp: 97.9 F (36.6 C)  TempSrc: Oral  Resp: 20  Height: 5' 10.5" (1.791 m)  Weight: 230 lb (104.327 kg)  SpO2: 99%   Body mass index is 32.52 kg/(m^2). GENERAL: The patient is a well-nourished male, in no acute distress. The vital signs are documented above. CARDIOVASCULAR: There is a regular rate and rhythm. PULMONARY: There is good air exchange bilaterally without wheezing or rales. His fistula is pulsatile. His incision has healed nicely.  DUPLEX: Duplex scan of the fistula on 03/31/2014 showed an area of stenosis in the outflow vein near the shoulder. Diameters of the fistula range from 0.52-0.90 cm. Depths are reasonable.  MEDICAL ISSUES: Chronic kidney disease (CKD), stage IV (severe) The fistula appears to be maturing adequately except that he has an outflow stenosis near the shoulder. This reason I have recommended we proceed with a fistulogram and venoplasty. We have discussed the procedure potential complications of this is scheduled for 04/19/2014. We will obviously limited contrast as he is not yet on dialysis.   [place] [facility] [place] [facility]: [phone]

## 2014-04-07 NOTE — Assessment & Plan Note (Signed)
The fistula appears to be maturing adequately except that he has an outflow stenosis near the shoulder. This reason I have recommended we proceed with a fistulogram and venoplasty. We have discussed the procedure potential complications of this is scheduled for 04/19/2014. We will obviously limited contrast as he is not yet on dialysis.

## 2014-04-13 ENCOUNTER — Encounter (HOSPITAL_COMMUNITY): Payer: Self-pay | Admitting: Pharmacy Technician

## 2014-04-19 ENCOUNTER — Encounter (HOSPITAL_COMMUNITY)
Admission: RE | Disposition: A | Discharge status: Home / Self Care | Payer: Self-pay | Source: Ambulatory Visit | Attending: Vascular Surgery

## 2014-04-19 ENCOUNTER — Other Ambulatory Visit: Payer: Self-pay

## 2014-04-19 ENCOUNTER — Ambulatory Visit (HOSPITAL_COMMUNITY)
Admission: RE | Admit: 2014-04-19 | Discharge: 2014-04-19 | Disposition: A | Discharge status: Home / Self Care | Payer: Medicare Other | Source: Ambulatory Visit | Attending: Vascular Surgery | Admitting: Vascular Surgery

## 2014-04-19 DIAGNOSIS — N186 End stage renal disease: Secondary | ICD-10-CM

## 2014-04-19 DIAGNOSIS — T82898A Other specified complication of vascular prosthetic devices, implants and grafts, initial encounter: Secondary | ICD-10-CM | POA: Diagnosis not present

## 2014-04-19 DIAGNOSIS — N184 Chronic kidney disease, stage 4 (severe): Secondary | ICD-10-CM | POA: Insufficient documentation

## 2014-04-19 DIAGNOSIS — Y849 Medical procedure, unspecified as the cause of abnormal reaction of the patient, or of later complication, without mention of misadventure at the time of the procedure: Secondary | ICD-10-CM | POA: Diagnosis not present

## 2014-04-19 DIAGNOSIS — I82B19 Acute embolism and thrombosis of unspecified subclavian vein: Secondary | ICD-10-CM | POA: Insufficient documentation

## 2014-04-19 DIAGNOSIS — Z48812 Encounter for surgical aftercare following surgery on the circulatory system: Secondary | ICD-10-CM

## 2014-04-19 HISTORY — PX: SHUNTOGRAM: SHX5491

## 2014-04-19 LAB — POCT I-STAT, CHEM 8
BUN: 50 mg/dL — ABNORMAL HIGH (ref 6–23)
Calcium, Ion: 1.38 mmol/L — ABNORMAL HIGH (ref 1.13–1.30)
Chloride: 109 mEq/L (ref 96–112)
Creatinine, Ser: 4.2 mg/dL — ABNORMAL HIGH (ref 0.50–1.35)
Glucose, Bld: 63 mg/dL — ABNORMAL LOW (ref 70–99)
HCT: 38 % — ABNORMAL LOW (ref 39.0–52.0)
Hemoglobin: 12.9 g/dL — ABNORMAL LOW (ref 13.0–17.0)
Potassium: 4.3 mEq/L (ref 3.7–5.3)
Sodium: 142 mEq/L (ref 137–147)
TCO2: 21 mmol/L (ref 0–100)

## 2014-04-19 LAB — GLUCOSE, CAPILLARY
Glucose-Capillary: 68 mg/dL — ABNORMAL LOW (ref 70–99)
Glucose-Capillary: 114 mg/dL — ABNORMAL HIGH (ref 70–99)
Glucose-Capillary: 64 mg/dL — ABNORMAL LOW (ref 70–99)
Glucose-Capillary: 76 mg/dL (ref 70–99)

## 2014-04-19 SURGERY — ASSESSMENT, SHUNT FUNCTION, WITH CONTRAST RADIOGRAPHIC STUDY
Anesthesia: LOCAL | Laterality: Left

## 2014-04-19 MED ORDER — SODIUM CHLORIDE 0.9 % IV SOLN: INTRAVENOUS | Status: DC

## 2014-04-19 MED ORDER — DEXTROSE 50 % IV SOLN
INTRAVENOUS | Status: AC
  Administered 2014-04-19: 25 mL
  Filled 2014-04-19: qty 50

## 2014-04-19 MED ORDER — HEPARIN SODIUM (PORCINE) 1000 UNIT/ML IJ SOLN
INTRAMUSCULAR | Status: AC
  Filled 2014-04-19: qty 1

## 2014-04-19 MED ORDER — HEPARIN (PORCINE) IN NACL 2-0.9 UNIT/ML-% IJ SOLN
INTRAMUSCULAR | Status: AC
  Filled 2014-04-19: qty 500

## 2014-04-19 MED ORDER — LIDOCAINE HCL (PF) 1 % IJ SOLN
INTRAMUSCULAR | Status: AC
  Filled 2014-04-19: qty 30

## 2014-04-19 NOTE — Interval H&P Note (Signed)
History and Physical Interval Note:  04/19/2014 7:04 AM  [name]  has presented today for surgery, with the diagnosis of End stage renal  The various methods of treatment have been discussed with the patient and family. After consideration of risks, benefits and other options for treatment, the patient has consented to  Procedure(s): FISTULOGRAM (Left) as a surgical intervention .  The patient's history has been reviewed, patient examined, no change in status, stable for surgery.  I have reviewed the patient's chart and labs.  Questions were answered to the patient's satisfaction.     DICKSON,CHRISTOPHER S

## 2014-04-19 NOTE — Progress Notes (Signed)
pts blood sugar on arrival was 68.  Half a cup of apple juice was given.  CBG was repeated and sugar was 64.  Half an amp of D50 was given.  Will re check in 15 minutes.  Pt is alert and oriented.  States that he feels ok.  Will continue to monitor closely

## 2014-04-19 NOTE — Op Note (Signed)
   PATIENT: [name]   MRN: [id number] DOB: [birth year]    DATE OF PROCEDURE: 04/19/2014  INDICATIONS: [name] is a 73 y.o. male who had a left brachiocephalic AV fistula placed in May. On his follow up visit, the fistula was pulsatile suggesting outflow stenosis. He presents for a fistulogram and venoplasty.  PROCEDURE:  1. Ultrasound-guided access to left AV fistula 2. fistulogram left brachial cephalic AV fistula 3. venoplasty of 4 areas of stenosis within brachiocephalic AV  SURGEON: Judeth Cornfield. Scot Dock, MD, FACS  ANESTHESIA: local   EBL: minimal  TECHNIQUE: The patient was taken to the peripheral vascular lab. The left upper extremity was prepped and draped in the usual sterile fashion. Under ultrasound guidance, after the skin was anesthetized, the proximal fistula was cannulated with a micropuncture needle and micropuncture sheath introduced over a wire. A fistulogram was obtained which showed that in the upper arm the cephalic vein became bifid. There was a stenosis in each of these veins. In addition, centrally it appeared that the subclavian vein was occluded with a large collateral reconstituting the internal jugular vein. I elected to address the areas of stenosis with venoplasty. The micropuncture sheath was exchanged for a 5 [place] sheath over a [employer] wire. The patient received 4000 units of IV heparin. An angled Glidewire was advanced into the inferior vein and this area of stenosis was ballooned with a 4 mm x 4 cm balloon to 10 atmospheres for 1 minute. I then directed the wire into the superior branch and address the stenosis here again with a 4 mm x 4 cm balloon which was inflated to 10 atmospheres for 1 minute. Follow up films showed some residual stenosis and confirm the subclavian vein occlusion distally. I went back with the 4 mm x 4 cm balloon was again inflated but the balloon deflated suggesting a small puncture. In order to provide more radial force I selected  a 4 mm x 2 cm balloon and these areas were again ballooned up to 24 atmospheres for 1 minute. There was also area in the more peripheral aspect of the fistula where the wire appeared to be hanging up and there was a mild stenosis here. This was also ballooned to 24 atmospheres for 1 minute. Completion film showed an excellent result of the 2 areas of stenosis within the bifid cephalic vein. A 4-0 Monocryl was placed around the cannulation site and pressure for hemostasis. No immediate complications were noted.  FINDINGS:  1. Bifid cephalic vein in the upper arm. 2. There is an area of stenosis within each of the branches of the cephalic vein in the upper arm which was successfully ballooned as described above. 3. 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  Deitra Mayo, MD, FACS [facility]  DATE OF DICTATION:   04/19/2014

## 2014-04-19 NOTE — Discharge Instructions (Signed)
AV Fistula, Care After   Refer to this sheet in the next few weeks. These instructions provide you with information on caring for yourself after your procedure. Your caregiver may also give you more specific instructions. Your treatment has been planned according to current medical practices, but problems sometimes occur. Call your caregiver if you have any problems or questions after your procedure. HOME CARE INSTRUCTIONS   Do not drive a car or take public transportation alone.  Do not drink alcohol.  Only take medicine that has been prescribed by your caregiver.  Do not sign important papers or make important decisions.  Have a responsible person with you.  Ask your caregiver to show you how to check your access at home for a vibration (called a "thrill") or for a sound (called a "bruit" pronounced brew-ee).  Your vein will need time to enlarge and mature so needles can be inserted for dialysis. Follow your caregiver's instructions about what you need to do to make this happen.  Keep dressings clean and dry.  Keep the arm elevated above your heart. Use a pillow.  Rest.  Use the arm as usual for all activities.  Have the stitches or tape closures removed in 10 to 14 days, or as directed by your caregiver.  Do not sleep or lie on the area of the fistula or that arm. This may decrease or stop the blood flow through your fistula.  Do not allow blood pressures to be taken on this arm.  Do not allow blood drawing to be done from the graft.  Do not wear tight clothing around the access site or on the arm.  Avoid lifting heavy objects with the arm that has the fistula.  Do not use creams or lotions over the access site. SEEK MEDICAL CARE IF:   You have a fever.  You have swelling around the fistula that gets worse, or you have new pain.  You have unusual bleeding at the fistula site or from any other area.  You have pus or other drainage at the fistula site.  You have  skin redness or red streaking on the skin around, above, or below the fistula site.  Your access site feels warm.  You have any flu-like symptoms. SEEK IMMEDIATE MEDICAL CARE IF:   You have pain, numbness, or an unusual pale skin on the hand or on the side of your fistula.  You have dizziness or weakness that you have not had before.  You have shortness of breath.  You have chest pain.  Your fistula disconnects or breaks, and there is bleeding that cannot be easily controlled. Call for local emergency medical help. Do not try to drive yourself to the hospital. MAKE SURE YOU  Understand these instructions.  Will watch your condition.  Will get help right away if you are not doing well or get worse. Document Released: 09/10/2005 Document Revised: 01/25/2014 Document Reviewed: 02/28/2011 [employer] Patient Information 2015 [place], [place]. This information is not intended to replace advice given to you by your health care provider. Make sure you discuss any questions you have with your health care provider.

## 2014-04-19 NOTE — H&P (View-Only) (Signed)
   Patient name: [name] MRN: [id number] DOB: 16-Feb-1941 Sex: male  REASON FOR VISIT: follow up a left brachiocephalic AV fistula.  HPI: [name] is a 73 y.o. male who had a left brachiocephalic AV fistula placed on 02/09/2014. He comes in for a routine follow up visit. He is not yet on dialysis. He has no specific complaints.  REVIEW OF SYSTEMS: [name] ] denotes positive finding; [  ] denotes negative finding  CARDIOVASCULAR:  [ ]  chest pain   [ ]  dyspnea on exertion    CONSTITUTIONAL:  [ ]  fever   [ ]  chills  PHYSICAL EXAM: Filed Vitals:   04/07/14 1538  BP: 150/57  Pulse: 58  Temp: 97.9 F (36.6 C)  TempSrc: Oral  Resp: 20  Height: 5' 10.5" (1.791 m)  Weight: 230 lb (104.327 kg)  SpO2: 99%   Body mass index is 32.52 kg/(m^2). GENERAL: The patient is a well-nourished male, in no acute distress. The vital signs are documented above. CARDIOVASCULAR: There is a regular rate and rhythm. PULMONARY: There is good air exchange bilaterally without wheezing or rales. His fistula is pulsatile. His incision has healed nicely.  DUPLEX: Duplex scan of the fistula on 03/31/2014 showed an area of stenosis in the outflow vein near the shoulder. Diameters of the fistula range from 0.52-0.90 cm. Depths are reasonable.  MEDICAL ISSUES: Chronic kidney disease (CKD), stage IV (severe) The fistula appears to be maturing adequately except that he has an outflow stenosis near the shoulder. This reason I have recommended we proceed with a fistulogram and venoplasty. We have discussed the procedure potential complications of this is scheduled for 04/19/2014. We will obviously limited contrast as he is not yet on dialysis.   [place] [facility] [place] [facility]: [phone]

## 2014-04-19 NOTE — Progress Notes (Signed)
1/2 amp D50 given at 0640, CBG recheck @ 0700 114.  Pt alert and oriented.  Will continue to monitor

## 2014-04-21 DIAGNOSIS — I770 Arteriovenous fistula, acquired: Secondary | ICD-10-CM | POA: Diagnosis not present

## 2014-04-21 DIAGNOSIS — N2581 Secondary hyperparathyroidism of renal origin: Secondary | ICD-10-CM | POA: Diagnosis not present

## 2014-04-21 DIAGNOSIS — I129 Hypertensive chronic kidney disease with stage 1 through stage 4 chronic kidney disease, or unspecified chronic kidney disease: Secondary | ICD-10-CM | POA: Diagnosis not present

## 2014-04-21 DIAGNOSIS — N184 Chronic kidney disease, stage 4 (severe): Secondary | ICD-10-CM | POA: Diagnosis not present

## 2014-04-23 ENCOUNTER — Other Ambulatory Visit: Payer: Self-pay | Admitting: *Deleted

## 2014-04-26 ENCOUNTER — Telehealth: Payer: Self-pay | Admitting: Vascular Surgery

## 2014-04-26 NOTE — Telephone Encounter (Signed)
Message copied by Gena Fray on Mon Apr 26, 2014  2:46 PM ------      Message from: Mena Goes      Created: Fri Apr 23, 2014  5:02 PM      Regarding: Schedule                   ----- Message -----         From: Angelia Mould, MD         Sent: 04/19/2014   8:46 AM           To: Vvs Charge Pool      Subject: charge and f/u                                           PROCEDURE:        1. Ultrasound-guided access to left AV fistula      2. fistulogram left brachial cephalic AV fistula      3. venoplasty of 4 areas of stenosis within brachiocephalic AV                  SURGEON: Judeth Cornfield. Scot Dock, MD, FACS                  He will need a follow up visit in 6 weeks with the duplex. Thank you.      CD ------

## 2014-04-26 NOTE — Telephone Encounter (Signed)
Spoke with pt to schedule appointment, dpm °

## 2014-05-06 ENCOUNTER — Ambulatory Visit: Payer: Medicare Other | Admitting: Internal Medicine

## 2014-05-07 ENCOUNTER — Ambulatory Visit (INDEPENDENT_AMBULATORY_CARE_PROVIDER_SITE_OTHER): Payer: Medicare Other | Admitting: Internal Medicine

## 2014-05-07 DIAGNOSIS — E538 Deficiency of other specified B group vitamins: Secondary | ICD-10-CM | POA: Diagnosis not present

## 2014-05-07 MED ORDER — CYANOCOBALAMIN 1000 MCG/ML IJ SOLN
1000.0000 ug | Freq: Once | INTRAMUSCULAR | Status: AC
  Administered 2014-05-07: 1000 ug via INTRAMUSCULAR

## 2014-05-31 ENCOUNTER — Ambulatory Visit (INDEPENDENT_AMBULATORY_CARE_PROVIDER_SITE_OTHER): Payer: Medicare Other | Admitting: Family Medicine

## 2014-05-31 ENCOUNTER — Ambulatory Visit (INDEPENDENT_AMBULATORY_CARE_PROVIDER_SITE_OTHER): Payer: Medicare Other

## 2014-05-31 VITALS — BP 120/46 | HR 67 | Temp 97.9°F | Resp 16 | Ht 69.25 in | Wt 229.0 lb

## 2014-05-31 DIAGNOSIS — I70209 Unspecified atherosclerosis of native arteries of extremities, unspecified extremity: Secondary | ICD-10-CM

## 2014-05-31 DIAGNOSIS — J069 Acute upper respiratory infection, unspecified: Secondary | ICD-10-CM

## 2014-05-31 DIAGNOSIS — J189 Pneumonia, unspecified organism: Secondary | ICD-10-CM | POA: Diagnosis not present

## 2014-05-31 DIAGNOSIS — R05 Cough: Secondary | ICD-10-CM | POA: Diagnosis not present

## 2014-05-31 DIAGNOSIS — J181 Lobar pneumonia, unspecified organism: Principal | ICD-10-CM

## 2014-05-31 DIAGNOSIS — E119 Type 2 diabetes mellitus without complications: Secondary | ICD-10-CM

## 2014-05-31 DIAGNOSIS — R059 Cough, unspecified: Secondary | ICD-10-CM | POA: Diagnosis not present

## 2014-05-31 LAB — POCT CBC
Granulocyte percent: 70 %G (ref 37–80)
HCT, POC: 34.9 % — AB (ref 43.5–53.7)
Hemoglobin: 11 g/dL — AB (ref 14.1–18.1)
Lymph, poc: 2.4 (ref 0.6–3.4)
MCH, POC: 32.7 pg — AB (ref 27–31.2)
MCHC: 31.4 g/dL — AB (ref 31.8–35.4)
MCV: 104.2 fL — AB (ref 80–97)
MID (cbc): 0.6 (ref 0–0.9)
MPV: 7.7 fL (ref 0–99.8)
POC Granulocyte: 7 — AB (ref 2–6.9)
POC LYMPH PERCENT: 24 %L (ref 10–50)
POC MID %: 5.9 %M (ref 0–12)
Platelet Count, POC: 164 10*3/uL (ref 142–424)
RBC: 3.35 M/uL — AB (ref 4.69–6.13)
RDW, POC: 17.5 %
WBC: 10 10*3/uL (ref 4.6–10.2)

## 2014-05-31 LAB — GLUCOSE, POCT (MANUAL RESULT ENTRY): POC Glucose: 226 mg/dl — AB (ref 70–99)

## 2014-05-31 MED ORDER — LEVOFLOXACIN 500 MG PO TABS: 500.0000 mg | ORAL_TABLET | Freq: Every day | ORAL | Status: DC

## 2014-05-31 NOTE — Progress Notes (Signed)
Subjective: 73 year old man who has a long list of health issues. He is diabetic. A week ago he started getting a respiratory tract infection from a couple of friends and coworkers who had colds. It seemed to settle down into his chest some with coughing up brown phlegm. Yesterday he had a temperature up above 101. The fever is down this morning. He does not smoke. He does continue to work part time.  Objective: Pleasant gentleman in no major distress. TMs are normal. Throat clear. Neck supple without significant nodes. Chest clear to auscultation. Heart regular without murmur. He does have an O2 saturation of only 93% this morning. Looking back in his medical records he usually runs 98-100%.  Assessment: Cough Upper respiratory infection Mild hypoxemia Diabetes  Plan: Check chest x-ray. Also check his glucose and CBC.  Results for orders placed in visit on 05/31/14  POCT CBC      Result Value Ref Range   WBC 10.0  4.6 - 10.2 K/uL   Lymph, poc 2.4  0.6 - 3.4   POC LYMPH PERCENT 24.0  10 - 50 %L   MID (cbc) 0.6  0 - 0.9   POC MID % 5.9  0 - 12 %M   POC Granulocyte 7.0 (*) 2 - 6.9   Granulocyte percent 70.0  37 - 80 %G   RBC 3.35 (*) 4.69 - 6.13 M/uL   Hemoglobin 11.0 (*) 14.1 - 18.1 g/dL   HCT, POC 34.9 (*) 43.5 - 53.7 %   MCV 104.2 (*) 80 - 97 fL   MCH, POC 32.7 (*) 27 - 31.2 pg   MCHC 31.4 (*) 31.8 - 35.4 g/dL   RDW, POC 17.5     Platelet Count, POC 164.0  142 - 424 K/uL   MPV 7.7  0 - 99.8 fL  GLUCOSE, POCT (MANUAL RESULT ENTRY)      Result Value Ref Range   POC Glucose 226 (*) 70 - 99 mg/dl   [facility] reading (PRIMARY) by  Dr. Linna Darner Left lower lobe basilar pneumonic infiltrate  Assessment: Left lower lobe pneumonia  Plan: Levaquin 750 mg one daily Patient is allergic to penicillin therefore elected not to give him an injection of ceftriaxone today though probably could use it if he got worse.

## 2014-05-31 NOTE — Patient Instructions (Addendum)
Take Levaquin one daily for infection  Stop the Levaquin if you develop muscle or tendon pains  Return if in all worse  Drink plenty of fluids to help thin the secretions  Take Mucinex plain which can be purchased over-the-counter to help thin the secretions also.  Try to get plenty of rest  Monitor your glucoses carefully while you're on the Levaquin. The combination of Levaquin and her glucose medicine can sometimes drop sugars too low, but I doubt that will happen. Your sugar today is 226. If it starts running less than 120 please decrease the glipizide to one half tablet twice daily rather than 1 tablet twice daily while you're on this medicine.  Return here or to your primary care physician about Thursday or Friday to get your lungs recheck.

## 2014-06-03 ENCOUNTER — Ambulatory Visit
Admission: RE | Admit: 2014-06-03 | Discharge: 2014-06-03 | Disposition: A | Discharge status: Home / Self Care | Payer: Medicare Other | Source: Ambulatory Visit | Attending: Internal Medicine | Admitting: Internal Medicine

## 2014-06-03 ENCOUNTER — Ambulatory Visit (INDEPENDENT_AMBULATORY_CARE_PROVIDER_SITE_OTHER): Payer: Medicare Other | Admitting: Internal Medicine

## 2014-06-03 ENCOUNTER — Encounter: Payer: Self-pay | Admitting: Internal Medicine

## 2014-06-03 ENCOUNTER — Telehealth: Payer: Self-pay

## 2014-06-03 VITALS — BP 108/58 | HR 72 | Temp 98.4°F | Resp 20 | Ht 65.75 in | Wt 224.0 lb

## 2014-06-03 DIAGNOSIS — R0989 Other specified symptoms and signs involving the circulatory and respiratory systems: Secondary | ICD-10-CM | POA: Diagnosis not present

## 2014-06-03 DIAGNOSIS — R059 Cough, unspecified: Secondary | ICD-10-CM | POA: Diagnosis not present

## 2014-06-03 DIAGNOSIS — J189 Pneumonia, unspecified organism: Secondary | ICD-10-CM

## 2014-06-03 DIAGNOSIS — N185 Chronic kidney disease, stage 5: Secondary | ICD-10-CM | POA: Diagnosis not present

## 2014-06-03 DIAGNOSIS — I70209 Unspecified atherosclerosis of native arteries of extremities, unspecified extremity: Secondary | ICD-10-CM | POA: Diagnosis not present

## 2014-06-03 DIAGNOSIS — R0609 Other forms of dyspnea: Secondary | ICD-10-CM | POA: Diagnosis not present

## 2014-06-03 DIAGNOSIS — E119 Type 2 diabetes mellitus without complications: Secondary | ICD-10-CM

## 2014-06-03 DIAGNOSIS — R918 Other nonspecific abnormal finding of lung field: Secondary | ICD-10-CM | POA: Diagnosis not present

## 2014-06-03 DIAGNOSIS — R05 Cough: Secondary | ICD-10-CM | POA: Diagnosis not present

## 2014-06-03 DIAGNOSIS — R509 Fever, unspecified: Secondary | ICD-10-CM | POA: Diagnosis not present

## 2014-06-03 MED ORDER — CLARITHROMYCIN 250 MG PO TABS: 250.0000 mg | ORAL_TABLET | Freq: Two times a day (BID) | ORAL | Status: DC

## 2014-06-03 MED ORDER — CEFTRIAXONE SODIUM 1 G IJ SOLR
1.0000 g | Freq: Once | INTRAMUSCULAR | Status: AC
  Administered 2014-06-03: 1 g via INTRAMUSCULAR

## 2014-06-03 NOTE — Progress Notes (Signed)
   Subjective:    Patient ID: [name], male    DOB: 01-26-1941, 73 y.o.   MRN: [id number]  HPI  Came down with URI about a week and a half ago. Thinks he caught it from a member of the [place]. He went to a meeting and church members and across her him with respiratory infection. He went to urgent care on September 7 and was diagnosed with left lower lobe pneumonia. Was started on Levaquin. Says he feels some better and is less short of breath. Pulse oximetry today is 96% on room air. Wants to go back to work next week. I think he needs to stay out of and rest. Repeat chest x-ray today shows improvement in left lower lobe opacity. Has had bad dreams on Levaquin.    Review of Systems     Objective:   Physical Exam  Skin warm and dry. Nodes none. Chest: rales noted left lower lobe. Cardiac exam regular rate and rhythm. Extremities without edema. TMs and pharynx are clear.      Assessment & Plan:  Left lower lobe pneumonia  Diabetes mellitus-continue to monitor Accu-Cheks  Chronic kidney disease-need to give reduced dose of Biaxin due to renal clearance issues.  Plan: Rocephin 1 g IM. Biaxin 250 mg by mouth twice a day for 10 days. Return in 2 weeks. No work for 2 weeks. Rest drink plenty of fluids and take medications as prescribed. Will have repeat chest x-ray upon return. He is to stop Zocor while he is taking Biaxin.  25 minutes spent with patient

## 2014-06-03 NOTE — Telephone Encounter (Signed)
Patient will be seen in office to discuss pneumonia and xray results.

## 2014-06-03 NOTE — Patient Instructions (Addendum)
Change to Biaxin 250 mg twice a day x 10 days. Return in 2 weeks. Rocephin one gram IM given today. No work for 2 weeks.

## 2014-06-08 ENCOUNTER — Encounter: Payer: Self-pay | Admitting: Vascular Surgery

## 2014-06-09 ENCOUNTER — Encounter: Payer: Medicare Other | Admitting: Vascular Surgery

## 2014-06-09 ENCOUNTER — Other Ambulatory Visit (HOSPITAL_COMMUNITY): Payer: Medicare Other

## 2014-06-18 ENCOUNTER — Ambulatory Visit: Payer: Medicare Other | Admitting: Internal Medicine

## 2014-06-21 ENCOUNTER — Encounter: Payer: Self-pay | Admitting: Internal Medicine

## 2014-06-21 ENCOUNTER — Ambulatory Visit (INDEPENDENT_AMBULATORY_CARE_PROVIDER_SITE_OTHER): Payer: Medicare Other | Admitting: Internal Medicine

## 2014-06-21 ENCOUNTER — Ambulatory Visit
Admission: RE | Admit: 2014-06-21 | Discharge: 2014-06-21 | Disposition: A | Discharge status: Home / Self Care | Payer: Medicare Other | Source: Ambulatory Visit | Attending: Internal Medicine | Admitting: Internal Medicine

## 2014-06-21 VITALS — BP 138/62 | HR 60 | Temp 97.8°F | Ht 70.0 in | Wt 227.0 lb

## 2014-06-21 DIAGNOSIS — J189 Pneumonia, unspecified organism: Secondary | ICD-10-CM

## 2014-06-21 DIAGNOSIS — E119 Type 2 diabetes mellitus without complications: Secondary | ICD-10-CM

## 2014-06-21 DIAGNOSIS — R61 Generalized hyperhidrosis: Secondary | ICD-10-CM | POA: Diagnosis not present

## 2014-06-21 DIAGNOSIS — I70209 Unspecified atherosclerosis of native arteries of extremities, unspecified extremity: Secondary | ICD-10-CM | POA: Diagnosis not present

## 2014-06-21 DIAGNOSIS — E538 Deficiency of other specified B group vitamins: Secondary | ICD-10-CM

## 2014-06-21 DIAGNOSIS — Z09 Encounter for follow-up examination after completed treatment for conditions other than malignant neoplasm: Secondary | ICD-10-CM | POA: Diagnosis not present

## 2014-06-21 DIAGNOSIS — N184 Chronic kidney disease, stage 4 (severe): Secondary | ICD-10-CM | POA: Diagnosis not present

## 2014-06-21 DIAGNOSIS — R5383 Other fatigue: Secondary | ICD-10-CM | POA: Diagnosis not present

## 2014-06-21 DIAGNOSIS — R5381 Other malaise: Secondary | ICD-10-CM | POA: Diagnosis not present

## 2014-06-21 DIAGNOSIS — Z23 Encounter for immunization: Secondary | ICD-10-CM

## 2014-06-21 MED ORDER — CYANOCOBALAMIN 1000 MCG/ML IJ SOLN
1000.0000 ug | Freq: Once | INTRAMUSCULAR | Status: AC
  Administered 2014-06-21: 1000 ug via INTRAMUSCULAR

## 2014-06-21 NOTE — Progress Notes (Signed)
   Subjective:    Patient ID: [name], male    DOB: 1940-11-22, 73 y.o.   MRN: [id number]  HPI Followup today on community acquired pneumonia. He's felt quite tired. He just returned to work at [employer] today. He work 4 hours and was tired. Says sputum has cleared up. He is no longer coughing. He feels better. He also needs B12 injection. In May, he was found to have a low normal B12 level of 237 and we started giving him IM B12 injections monthly. Repeat chest x-ray today shows complete resolution of pneumonia.    Review of Systems     Objective:   Physical Exam Neck is supple without adenopathy. Chest clear. Cardiac exam regular rate and rhythm. Extremities without edema       Assessment & Plan:  Community-acquired pneumonia-resolved. Patient says he got Prevnar 40 at [facility] last year and will need to get me the date of that vaccine. He's had previous pneumococcal vaccine in the past.   B12 deficiency-return monthly for B12 injections  Chronic kidney disease  Controlled type 2 diabetes  Health maintenance-flu vaccine given today  25 minutes spent with patient explaining process of pneumonia to him as well as B12 deficiency

## 2014-06-21 NOTE — Patient Instructions (Signed)
Given B12 injection today and return monthly. He is to find out date of Prevnar 13 vaccine done at [facility] last year. Pneumonia has resolved. Flu vaccine given today.

## 2014-06-23 ENCOUNTER — Encounter: Payer: Self-pay | Admitting: Gastroenterology

## 2014-06-29 ENCOUNTER — Encounter: Payer: Self-pay | Admitting: Vascular Surgery

## 2014-06-30 ENCOUNTER — Encounter: Payer: Self-pay | Admitting: Vascular Surgery

## 2014-06-30 ENCOUNTER — Ambulatory Visit (HOSPITAL_COMMUNITY)
Admission: RE | Admit: 2014-06-30 | Discharge: 2014-06-30 | Disposition: A | Discharge status: Home / Self Care | Payer: Medicare Other | Source: Ambulatory Visit | Attending: Vascular Surgery | Admitting: Vascular Surgery

## 2014-06-30 ENCOUNTER — Ambulatory Visit (INDEPENDENT_AMBULATORY_CARE_PROVIDER_SITE_OTHER): Payer: Medicare Other | Admitting: Vascular Surgery

## 2014-06-30 VITALS — BP 121/50 | HR 67 | Resp 14 | Ht 70.5 in | Wt 225.0 lb

## 2014-06-30 DIAGNOSIS — I70209 Unspecified atherosclerosis of native arteries of extremities, unspecified extremity: Secondary | ICD-10-CM | POA: Diagnosis not present

## 2014-06-30 DIAGNOSIS — N186 End stage renal disease: Secondary | ICD-10-CM

## 2014-06-30 DIAGNOSIS — Z48812 Encounter for surgical aftercare following surgery on the circulatory system: Secondary | ICD-10-CM

## 2014-06-30 NOTE — Progress Notes (Signed)
     [id number] [name] is a 73 y.o. male who had a left brachiocephalic AV fistula placed on 02/09/2014.  He is here for a follow up visit s/p fistulogram with venoplasty times 4 on 04/19/2014.  He is still not on dialysis at this time.      Objective 121/50 67   14     Palpable radial pulses  Left hand N/V/M intact with out signs of steal Palpable thrill/pulsatile  the left upper arm fistula   Fistula duplex :Still shows stenosis and no growth since the venoplaty.  Vein mapping on the right upper extremity that was done [id number] show poor cephalic vein and small basilic veins.   Assessment/Planning: PROCEDURE:  1. Ultrasound-guided access to left AV fistula  2. fistulogram left brachial cephalic AV fistula  3. venoplasty of 4 areas of stenosis within brachiocephalic AV  4. 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    Plan:  He has not responded to multiple procedures on the right av fistula and has central venous occlusion.  At this time we will schedule him for a right upper extremity venogram.  He may be a candidate for right av fistula or if he needs dialysis sooner than later we may have to place a graft on the left side.     The patient discussed his options with Dr. Scot Dock today and has decided to proceed with the venogram on 07/12/2014.     Laurence Slate [facility] 06/30/2014 2:56 PM  Agree with above. 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 He has extensive collaterals around the shoulder. There appears to be subclavian vein occlusion on the left. For this reason, I think the best option is to consider him for new access. I reviewed his previous vein map and the vein was in the right arm are marginal. I have recommended that we proceed with a venogram to be sure that he has no central venous stenosis on the right. If he does not, then he could be considered  for a fistula or AV graft in the right upper extremity.  Deitra Mayo, MD, [place] [phone] 06/30/2014

## 2014-07-01 ENCOUNTER — Other Ambulatory Visit: Payer: Self-pay

## 2014-07-02 ENCOUNTER — Encounter (HOSPITAL_COMMUNITY): Payer: Self-pay | Admitting: Pharmacy Technician

## 2014-07-11 MED ORDER — SODIUM CHLORIDE 0.9 % IV SOLN
INTRAVENOUS | Status: DC
  Administered 2014-07-12: 07:00:00 via INTRAVENOUS

## 2014-07-12 ENCOUNTER — Encounter (HOSPITAL_COMMUNITY)
Admission: RE | Disposition: A | Discharge status: Home / Self Care | Payer: Self-pay | Source: Ambulatory Visit | Attending: Vascular Surgery

## 2014-07-12 ENCOUNTER — Ambulatory Visit (HOSPITAL_COMMUNITY)
Admission: RE | Admit: 2014-07-12 | Discharge: 2014-07-12 | Disposition: A | Discharge status: Home / Self Care | Payer: Medicare Other | Source: Ambulatory Visit | Attending: Vascular Surgery | Admitting: Vascular Surgery

## 2014-07-12 ENCOUNTER — Other Ambulatory Visit: Payer: Self-pay

## 2014-07-12 DIAGNOSIS — I10 Essential (primary) hypertension: Secondary | ICD-10-CM | POA: Diagnosis not present

## 2014-07-12 DIAGNOSIS — N185 Chronic kidney disease, stage 5: Secondary | ICD-10-CM | POA: Diagnosis not present

## 2014-07-12 DIAGNOSIS — K219 Gastro-esophageal reflux disease without esophagitis: Secondary | ICD-10-CM | POA: Diagnosis not present

## 2014-07-12 DIAGNOSIS — E785 Hyperlipidemia, unspecified: Secondary | ICD-10-CM | POA: Diagnosis not present

## 2014-07-12 DIAGNOSIS — I739 Peripheral vascular disease, unspecified: Secondary | ICD-10-CM | POA: Insufficient documentation

## 2014-07-12 DIAGNOSIS — Z7982 Long term (current) use of aspirin: Secondary | ICD-10-CM | POA: Insufficient documentation

## 2014-07-12 DIAGNOSIS — E118 Type 2 diabetes mellitus with unspecified complications: Secondary | ICD-10-CM | POA: Insufficient documentation

## 2014-07-12 HISTORY — PX: UNILATERAL UPPER EXTREMEITY ANGIOGRAM: SHX5517

## 2014-07-12 LAB — POCT I-STAT, CHEM 8
BUN: 51 mg/dL — ABNORMAL HIGH (ref 6–23)
Calcium, Ion: 1.44 mmol/L — ABNORMAL HIGH (ref 1.13–1.30)
Chloride: 110 mEq/L (ref 96–112)
Creatinine, Ser: 4.9 mg/dL — ABNORMAL HIGH (ref 0.50–1.35)
Glucose, Bld: 133 mg/dL — ABNORMAL HIGH (ref 70–99)
HCT: 34 % — ABNORMAL LOW (ref 39.0–52.0)
Hemoglobin: 11.6 g/dL — ABNORMAL LOW (ref 13.0–17.0)
Potassium: 4.5 mEq/L (ref 3.7–5.3)
Sodium: 142 mEq/L (ref 137–147)
TCO2: 20 mmol/L (ref 0–100)

## 2014-07-12 LAB — GLUCOSE, CAPILLARY: Glucose-Capillary: 137 mg/dL — ABNORMAL HIGH (ref 70–99)

## 2014-07-12 SURGERY — UNILATERAL UPPER EXTREMEITY ANGIOGRAM
Anesthesia: LOCAL

## 2014-07-12 NOTE — H&P (View-Only) (Signed)
     [id number] [name] is a 73 y.o. male who had a left brachiocephalic AV fistula placed on 02/09/2014.  He is here for a follow up visit s/p fistulogram with venoplasty times 4 on 04/19/2014.  He is still not on dialysis at this time.      Objective 121/50 67   14     Palpable radial pulses  Left hand N/V/M intact with out signs of steal Palpable thrill/pulsatile  the left upper arm fistula   Fistula duplex :Still shows stenosis and no growth since the venoplaty.  Vein mapping on the right upper extremity that was done 123XX123 show poor cephalic vein and small basilic veins.   Assessment/Planning: PROCEDURE:  1. Ultrasound-guided access to left AV fistula  2. fistulogram left brachial cephalic AV fistula  3. venoplasty of 4 areas of stenosis within brachiocephalic AV  4. 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    Plan:  He has not responded to multiple procedures on the right av fistula and has central venous occlusion.  At this time we will schedule him for a right upper extremity venogram.  He may be a candidate for right av fistula or if he needs dialysis sooner than later we may have to place a graft on the left side.     The patient discussed his options with Dr. Scot Dock today and has decided to proceed with the venogram on 07/12/2014.     Laurence Slate [facility] 06/30/2014 2:56 PM  Agree with above. 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 He has extensive collaterals around the shoulder. There appears to be subclavian vein occlusion on the left. For this reason, I think the best option is to consider him for new access. I reviewed his previous vein map and the vein was in the right arm are marginal. I have recommended that we proceed with a venogram to be sure that he has no central venous stenosis on the right. If he does not, then he could be considered  for a fistula or AV graft in the right upper extremity.  Deitra Mayo, MD, [place] [phone] 06/30/2014

## 2014-07-12 NOTE — Op Note (Signed)
   PATIENT: [name]   MRN: [id number] DOB: [birth year]    DATE OF PROCEDURE: 07/12/2014  INDICATIONS: [name] is a 73 y.o. male had a venous outflow problem on the left. Therefore, before placing access in the right arm we elected to obtain a central venogram on the right to rule out a central venous obstruction.  PROCEDURE: right central venogram  SURGEON: Judeth Cornfield. Scot Dock, MD, FACS   EBL: none  TECHNIQUE: The patient was taken to the peripheral vascular lab after an IV was started in the right forearm. Contrast was injected to the IV and the right arm pictures obtained of the central veins on the right. Initially there was poor filling of the central veins. Therefore repeated the study with the arm elevated and with compression of the veins to augment flow into the central veins. A total of 20 cc of contrast was used.  FINDINGS:  1. Brachial veins are patent in the right upper arm. No evidence of central venous stenosis is identified.  CLINICAL NOTE: By vein mapping, the veins in the right upper extremity were somewhat small. However we will explore these at the time of surgery he would be scheduled for a right AV fistula or AV graft. He is not yet on dialysis.  Deitra Mayo, MD, FACS [facility]  DATE OF DICTATION:   07/12/2014

## 2014-07-12 NOTE — Interval H&P Note (Signed)
History and Physical Interval Note:  07/12/2014 7:43 AM  [name]  has presented today for surgery, with the diagnosis of pvd  The various methods of treatment have been discussed with the patient and family. After consideration of risks, benefits and other options for treatment, the patient has consented to  Procedure(s): UNILATERAL UPPER EXTREMEITY ANGIOGRAM (N/A) as a surgical intervention .  The patient's history has been reviewed, patient examined, no change in status, stable for surgery.  I have reviewed the patient's chart and labs.  Questions were answered to the patient's satisfaction.     [name] S

## 2014-07-20 ENCOUNTER — Ambulatory Visit: Payer: Medicare Other | Admitting: Internal Medicine

## 2014-07-20 ENCOUNTER — Telehealth: Payer: Self-pay | Admitting: Internal Medicine

## 2014-07-20 ENCOUNTER — Ambulatory Visit (INDEPENDENT_AMBULATORY_CARE_PROVIDER_SITE_OTHER): Payer: Medicare Other | Admitting: Internal Medicine

## 2014-07-20 DIAGNOSIS — E538 Deficiency of other specified B group vitamins: Secondary | ICD-10-CM | POA: Diagnosis not present

## 2014-07-20 MED ORDER — CYANOCOBALAMIN 1000 MCG/ML IJ SOLN
1000.0000 ug | Freq: Once | INTRAMUSCULAR | Status: AC
  Administered 2014-07-20: 1000 ug via INTRAMUSCULAR

## 2014-07-20 NOTE — Telephone Encounter (Signed)
Patient was schedule for B12 injection 10.27.15 - spoke w/wife and was advised to try cell # & if he did not answer she would have him call back. Tried cell# no answer.  Left voice mail to call us back.

## 2014-07-23 DIAGNOSIS — I770 Arteriovenous fistula, acquired: Secondary | ICD-10-CM | POA: Diagnosis not present

## 2014-07-23 DIAGNOSIS — I129 Hypertensive chronic kidney disease with stage 1 through stage 4 chronic kidney disease, or unspecified chronic kidney disease: Secondary | ICD-10-CM | POA: Diagnosis not present

## 2014-07-23 DIAGNOSIS — N184 Chronic kidney disease, stage 4 (severe): Secondary | ICD-10-CM | POA: Diagnosis not present

## 2014-07-23 DIAGNOSIS — N2581 Secondary hyperparathyroidism of renal origin: Secondary | ICD-10-CM | POA: Diagnosis not present

## 2014-07-26 ENCOUNTER — Other Ambulatory Visit: Payer: Self-pay | Admitting: Internal Medicine

## 2014-07-26 ENCOUNTER — Encounter (HOSPITAL_COMMUNITY): Payer: Self-pay | Admitting: *Deleted

## 2014-07-26 DIAGNOSIS — G4730 Sleep apnea, unspecified: Secondary | ICD-10-CM

## 2014-07-26 MED ORDER — SODIUM CHLORIDE 0.9 % IV SOLN
INTRAVENOUS | Status: DC
  Administered 2014-07-27: 10:00:00 via INTRAVENOUS

## 2014-07-26 MED ORDER — SODIUM CHLORIDE 0.9 % IV SOLN
1500.0000 mg | INTRAVENOUS | Status: DC
  Filled 2014-07-26: qty 1500

## 2014-07-26 MED ORDER — CHLORHEXIDINE GLUCONATE CLOTH 2 % EX PADS: 6.0000 | MEDICATED_PAD | Freq: Once | CUTANEOUS | Status: DC

## 2014-07-26 NOTE — Progress Notes (Signed)
   07/26/14 1019  OBSTRUCTIVE SLEEP APNEA  Have you ever been diagnosed with sleep apnea through a sleep study? No  Do you snore loudly (loud enough to be heard through closed doors)?  0  Do you often feel tired, fatigued, or sleepy during the daytime? 0  Has anyone observed you stop breathing during your sleep? 0  Do you have, or are you being treated for high blood pressure? 1  BMI more than 35 kg/m2? 0  Age over 73 years old? 1  Neck circumference greater than 40 cm/16 inches? 1  Gender: 1  Obstructive Sleep Apnea Score 4  Score 4 or greater  Results sent to PCP

## 2014-07-26 NOTE — Progress Notes (Signed)
Stop Bang Assessment tool sent to Dr. Emeline General, pt's PCP.

## 2014-07-27 ENCOUNTER — Ambulatory Visit (HOSPITAL_COMMUNITY): Payer: Medicare Other | Admitting: Anesthesiology

## 2014-07-27 ENCOUNTER — Encounter (HOSPITAL_COMMUNITY): Payer: Self-pay | Admitting: Certified Registered"

## 2014-07-27 ENCOUNTER — Ambulatory Visit (HOSPITAL_COMMUNITY)
Admission: RE | Admit: 2014-07-27 | Discharge: 2014-07-27 | Disposition: A | Discharge status: Home / Self Care | Payer: Medicare Other | Source: Ambulatory Visit | Attending: Vascular Surgery | Admitting: Vascular Surgery

## 2014-07-27 ENCOUNTER — Other Ambulatory Visit: Payer: Self-pay | Admitting: *Deleted

## 2014-07-27 ENCOUNTER — Telehealth: Payer: Self-pay | Admitting: Vascular Surgery

## 2014-07-27 ENCOUNTER — Encounter (HOSPITAL_COMMUNITY)
Admission: RE | Disposition: A | Discharge status: Home / Self Care | Payer: Self-pay | Source: Ambulatory Visit | Attending: Vascular Surgery

## 2014-07-27 DIAGNOSIS — M1990 Unspecified osteoarthritis, unspecified site: Secondary | ICD-10-CM | POA: Insufficient documentation

## 2014-07-27 DIAGNOSIS — N184 Chronic kidney disease, stage 4 (severe): Secondary | ICD-10-CM | POA: Diagnosis not present

## 2014-07-27 DIAGNOSIS — Z794 Long term (current) use of insulin: Secondary | ICD-10-CM | POA: Diagnosis not present

## 2014-07-27 DIAGNOSIS — N186 End stage renal disease: Secondary | ICD-10-CM

## 2014-07-27 DIAGNOSIS — Z6831 Body mass index (BMI) 31.0-31.9, adult: Secondary | ICD-10-CM | POA: Insufficient documentation

## 2014-07-27 DIAGNOSIS — E119 Type 2 diabetes mellitus without complications: Secondary | ICD-10-CM | POA: Insufficient documentation

## 2014-07-27 DIAGNOSIS — D649 Anemia, unspecified: Secondary | ICD-10-CM | POA: Diagnosis not present

## 2014-07-27 DIAGNOSIS — Z87891 Personal history of nicotine dependence: Secondary | ICD-10-CM | POA: Insufficient documentation

## 2014-07-27 DIAGNOSIS — Z880 Allergy status to penicillin: Secondary | ICD-10-CM | POA: Insufficient documentation

## 2014-07-27 DIAGNOSIS — I82B29 Chronic embolism and thrombosis of unspecified subclavian vein: Secondary | ICD-10-CM | POA: Insufficient documentation

## 2014-07-27 DIAGNOSIS — I739 Peripheral vascular disease, unspecified: Secondary | ICD-10-CM | POA: Insufficient documentation

## 2014-07-27 DIAGNOSIS — I129 Hypertensive chronic kidney disease with stage 1 through stage 4 chronic kidney disease, or unspecified chronic kidney disease: Secondary | ICD-10-CM | POA: Diagnosis not present

## 2014-07-27 DIAGNOSIS — Z4931 Encounter for adequacy testing for hemodialysis: Secondary | ICD-10-CM

## 2014-07-27 DIAGNOSIS — K219 Gastro-esophageal reflux disease without esophagitis: Secondary | ICD-10-CM | POA: Diagnosis not present

## 2014-07-27 HISTORY — PX: BASCILIC VEIN TRANSPOSITION: SHX5742

## 2014-07-27 HISTORY — DX: Pneumonia, unspecified organism: J18.9

## 2014-07-27 HISTORY — DX: Anemia, unspecified: D64.9

## 2014-07-27 LAB — GLUCOSE, CAPILLARY: Glucose-Capillary: 126 mg/dL — ABNORMAL HIGH (ref 70–99)

## 2014-07-27 LAB — POCT I-STAT 4, (NA,K, GLUC, HGB,HCT)
Glucose, Bld: 108 mg/dL — ABNORMAL HIGH (ref 70–99)
HCT: 29 % — ABNORMAL LOW (ref 39.0–52.0)
Hemoglobin: 9.9 g/dL — ABNORMAL LOW (ref 13.0–17.0)
Potassium: 4.8 mEq/L (ref 3.7–5.3)
Sodium: 140 mEq/L (ref 137–147)

## 2014-07-27 SURGERY — TRANSPOSITION, VEIN, BASILIC
Anesthesia: Monitor Anesthesia Care | Site: Arm Upper | Laterality: Right

## 2014-07-27 MED ORDER — SODIUM CHLORIDE 0.9 % IJ SOLN
INTRAMUSCULAR | Status: AC
  Filled 2014-07-27: qty 10

## 2014-07-27 MED ORDER — LIDOCAINE HCL (CARDIAC) 20 MG/ML IV SOLN
INTRAVENOUS | Status: AC
  Filled 2014-07-27: qty 5

## 2014-07-27 MED ORDER — 0.9 % SODIUM CHLORIDE (POUR BTL) OPTIME
TOPICAL | Status: DC | PRN
  Administered 2014-07-27: 1000 mL

## 2014-07-27 MED ORDER — PROPOFOL 10 MG/ML IV BOLUS
INTRAVENOUS | Status: AC
  Filled 2014-07-27: qty 20

## 2014-07-27 MED ORDER — MIDAZOLAM HCL 2 MG/2ML IJ SOLN
INTRAMUSCULAR | Status: AC
Start: 2014-07-27 — End: 2014-07-27
  Filled 2014-07-27: qty 2

## 2014-07-27 MED ORDER — OXYCODONE HCL 5 MG PO TABS: 5.0000 mg | ORAL_TABLET | Freq: Four times a day (QID) | ORAL | Status: DC | PRN

## 2014-07-27 MED ORDER — MIDAZOLAM HCL 5 MG/5ML IJ SOLN
INTRAMUSCULAR | Status: DC | PRN
  Administered 2014-07-27: 1 mg via INTRAVENOUS

## 2014-07-27 MED ORDER — FENTANYL CITRATE 0.05 MG/ML IJ SOLN
INTRAMUSCULAR | Status: DC | PRN
  Administered 2014-07-27: 50 ug via INTRAVENOUS

## 2014-07-27 MED ORDER — PROTAMINE SULFATE 10 MG/ML IV SOLN
INTRAVENOUS | Status: DC | PRN
Start: 2014-07-27 — End: 2014-07-27
  Administered 2014-07-27 (×4): 10 mg via INTRAVENOUS

## 2014-07-27 MED ORDER — HEPARIN SODIUM (PORCINE) 1000 UNIT/ML IJ SOLN
INTRAMUSCULAR | Status: DC | PRN
  Administered 2014-07-27: 7000 [IU] via INTRAVENOUS

## 2014-07-27 MED ORDER — EPHEDRINE SULFATE 50 MG/ML IJ SOLN
INTRAMUSCULAR | Status: AC
  Filled 2014-07-27: qty 1

## 2014-07-27 MED ORDER — LIDOCAINE-EPINEPHRINE (PF) 1 %-1:200000 IJ SOLN
INTRAMUSCULAR | Status: AC
  Filled 2014-07-27: qty 10

## 2014-07-27 MED ORDER — LIDOCAINE HCL (CARDIAC) 20 MG/ML IV SOLN
INTRAVENOUS | Status: AC
  Filled 2014-07-27: qty 10

## 2014-07-27 MED ORDER — PHENYLEPHRINE HCL 10 MG/ML IJ SOLN
INTRAMUSCULAR | Status: AC
  Filled 2014-07-27: qty 1

## 2014-07-27 MED ORDER — THROMBIN 20000 UNITS EX SOLR
CUTANEOUS | Status: AC
  Filled 2014-07-27: qty 20000

## 2014-07-27 MED ORDER — SODIUM CHLORIDE 0.9 % IV SOLN
INTRAVENOUS | Status: DC | PRN
  Administered 2014-07-27 (×2): via INTRAVENOUS

## 2014-07-27 MED ORDER — LIDOCAINE HCL (PF) 1 % IJ SOLN
INTRAMUSCULAR | Status: DC | PRN
  Administered 2014-07-27: 30 mL

## 2014-07-27 MED ORDER — EPHEDRINE SULFATE 50 MG/ML IJ SOLN
INTRAMUSCULAR | Status: DC | PRN
  Administered 2014-07-27: 10 mg via INTRAVENOUS
  Administered 2014-07-27: 20 mg via INTRAVENOUS
  Administered 2014-07-27: 10 mg via INTRAVENOUS

## 2014-07-27 MED ORDER — LIDOCAINE-EPINEPHRINE (PF) 1 %-1:200000 IJ SOLN
INTRAMUSCULAR | Status: DC | PRN
  Administered 2014-07-27: 30 mL

## 2014-07-27 MED ORDER — LIDOCAINE HCL (PF) 1 % IJ SOLN
INTRAMUSCULAR | Status: AC
  Filled 2014-07-27: qty 30

## 2014-07-27 MED ORDER — PHENYLEPHRINE HCL 10 MG/ML IJ SOLN
10.0000 mg | INTRAVENOUS | Status: DC | PRN
  Administered 2014-07-27: 50 ug/min via INTRAVENOUS

## 2014-07-27 MED ORDER — HEPARIN SODIUM (PORCINE) 5000 UNIT/ML IJ SOLN
INTRAMUSCULAR | Status: DC | PRN
  Administered 2014-07-27: 11:00:00

## 2014-07-27 MED ORDER — VANCOMYCIN HCL 1000 MG IV SOLR
1000.0000 mg | INTRAVENOUS | Status: DC | PRN
  Administered 2014-07-27: 1000 mg via INTRAVENOUS

## 2014-07-27 MED ORDER — LIDOCAINE HCL (CARDIAC) 20 MG/ML IV SOLN
INTRAVENOUS | Status: DC | PRN
  Administered 2014-07-27: 50 mg via INTRAVENOUS

## 2014-07-27 MED ORDER — PROPOFOL INFUSION 10 MG/ML OPTIME
INTRAVENOUS | Status: DC | PRN
  Administered 2014-07-27: 100 ug/kg/min via INTRAVENOUS

## 2014-07-27 MED ORDER — HEPARIN SODIUM (PORCINE) 1000 UNIT/ML IJ SOLN
INTRAMUSCULAR | Status: AC
  Filled 2014-07-27: qty 1

## 2014-07-27 MED ORDER — FENTANYL CITRATE 0.05 MG/ML IJ SOLN
INTRAMUSCULAR | Status: AC
  Filled 2014-07-27: qty 5

## 2014-07-27 MED ORDER — PHENYLEPHRINE HCL 10 MG/ML IJ SOLN
INTRAMUSCULAR | Status: DC | PRN
  Administered 2014-07-27 (×2): 80 ug via INTRAVENOUS

## 2014-07-27 MED ORDER — PHENYLEPHRINE 40 MCG/ML (10ML) SYRINGE FOR IV PUSH (FOR BLOOD PRESSURE SUPPORT)
PREFILLED_SYRINGE | INTRAVENOUS | Status: AC
  Filled 2014-07-27: qty 20

## 2014-07-27 SURGICAL SUPPLY — 47 items
ADH SKN CLS APL DERMABOND .7 (GAUZE/BANDAGES/DRESSINGS) ×2
ARMBAND PINK RESTRICT EXTREMIT (MISCELLANEOUS) ×4 IMPLANT
BANDAGE ELASTIC 4 VELCRO ST LF (GAUZE/BANDAGES/DRESSINGS) ×3 IMPLANT
BLADE SURG 10 STRL SS (BLADE) ×2 IMPLANT
CANISTER SUCTION 2500CC (MISCELLANEOUS) ×4 IMPLANT
CANNULA VESSEL 3MM 2 BLNT TIP (CANNULA) ×3 IMPLANT
CLIP TI MEDIUM 6 (CLIP) ×4 IMPLANT
CLIP TI WIDE RED SMALL 6 (CLIP) ×10 IMPLANT
COVER PROBE W GEL 5X96 (DRAPES) IMPLANT
COVER SURGICAL LIGHT HANDLE (MISCELLANEOUS) ×4 IMPLANT
DECANTER SPIKE VIAL GLASS SM (MISCELLANEOUS) ×4 IMPLANT
DERMABOND ADVANCED (GAUZE/BANDAGES/DRESSINGS) ×2
DERMABOND ADVANCED .7 DNX12 (GAUZE/BANDAGES/DRESSINGS) ×2 IMPLANT
DRAIN PENROSE 1/2X12 LTX STRL (WOUND CARE) IMPLANT
ELECT REM PT RETURN 9FT ADLT (ELECTROSURGICAL) ×4
ELECTRODE REM PT RTRN 9FT ADLT (ELECTROSURGICAL) ×2 IMPLANT
GAUZE SPONGE 4X4 16PLY XRAY LF (GAUZE/BANDAGES/DRESSINGS) ×3 IMPLANT
GLOVE BIO SURGEON STRL SZ 6.5 (GLOVE) ×4 IMPLANT
GLOVE BIO SURGEON STRL SZ7.5 (GLOVE) ×4 IMPLANT
GLOVE BIO SURGEONS STRL SZ 6.5 (GLOVE) ×2
GLOVE BIOGEL PI IND STRL 6 (GLOVE) ×1 IMPLANT
GLOVE BIOGEL PI IND STRL 7.0 (GLOVE) ×1 IMPLANT
GLOVE BIOGEL PI IND STRL 8 (GLOVE) ×2 IMPLANT
GLOVE BIOGEL PI INDICATOR 6 (GLOVE) ×2
GLOVE BIOGEL PI INDICATOR 7.0 (GLOVE) ×2
GLOVE BIOGEL PI INDICATOR 8 (GLOVE) ×2
GLOVE SURG SS PI 7.0 STRL IVOR (GLOVE) ×6 IMPLANT
GOWN STRL REUS W/ TWL LRG LVL3 (GOWN DISPOSABLE) ×6 IMPLANT
GOWN STRL REUS W/ TWL XL LVL3 (GOWN DISPOSABLE) ×2 IMPLANT
GOWN STRL REUS W/TWL LRG LVL3 (GOWN DISPOSABLE) ×12
GOWN STRL REUS W/TWL XL LVL3 (GOWN DISPOSABLE) ×8
KIT BASIN OR (CUSTOM PROCEDURE TRAY) ×4 IMPLANT
KIT ROOM TURNOVER OR (KITS) ×4 IMPLANT
NS IRRIG 1000ML POUR BTL (IV SOLUTION) ×4 IMPLANT
PACK CV ACCESS (CUSTOM PROCEDURE TRAY) ×4 IMPLANT
PAD ARMBOARD 7.5X6 YLW CONV (MISCELLANEOUS) ×8 IMPLANT
SPONGE LAP 4X18 X RAY DECT (DISPOSABLE) ×3 IMPLANT
SPONGE SURGIFOAM ABS GEL 100 (HEMOSTASIS) IMPLANT
SUT PROLENE 6 0 BV (SUTURE) ×4 IMPLANT
SUT SILK 2 0 SH (SUTURE) ×3 IMPLANT
SUT SILK 3 0 (SUTURE) ×4
SUT SILK 3-0 18XBRD TIE 12 (SUTURE) ×1 IMPLANT
SUT VIC AB 3-0 SH 27 (SUTURE) ×8
SUT VIC AB 3-0 SH 27X BRD (SUTURE) ×3 IMPLANT
SUT VICRYL 4-0 PS2 18IN ABS (SUTURE) ×7 IMPLANT
UNDERPAD 30X30 INCONTINENT (UNDERPADS AND DIAPERS) ×4 IMPLANT
WATER STERILE IRR 1000ML POUR (IV SOLUTION) ×4 IMPLANT

## 2014-07-27 NOTE — H&P (View-Only) (Signed)
     [name] is a 73 y.o. male who had a left brachiocephalic AV fistula placed on 02/09/2014.  He is here for a follow up visit s/p fistulogram with venoplasty times 4 on 04/19/2014.  He is still not on dialysis at this time.      Objective 121/50 67   14     Palpable radial pulses  Left hand N/V/M intact with out signs of steal Palpable thrill/pulsatile  the left upper arm fistula   Fistula duplex :Still shows stenosis and no growth since the venoplaty.  Vein mapping on the right upper extremity that was done [id number] show poor cephalic vein and small basilic veins.   Assessment/Planning: PROCEDURE:  1. Ultrasound-guided access to left AV fistula  2. fistulogram left brachial cephalic AV fistula  3. venoplasty of 4 areas of stenosis within brachiocephalic AV  4. 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    Plan:  He has not responded to multiple procedures on the right av fistula and has central venous occlusion.  At this time we will schedule him for a right upper extremity venogram.  He may be a candidate for right av fistula or if he needs dialysis sooner than later we may have to place a graft on the left side.     The patient discussed his options with Dr. Scot Dock today and has decided to proceed with the venogram on 07/12/2014.     Laurence Slate [facility] 06/30/2014 2:56 PM  Agree with above. 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 He has extensive collaterals around the shoulder. There appears to be subclavian vein occlusion on the left. For this reason, I think the best option is to consider him for new access. I reviewed his previous vein map and the vein was in the right arm are marginal. I have recommended that we proceed with a venogram to be sure that he has no central venous stenosis on the right. If he does not, then he could be considered  for a fistula or AV graft in the right upper extremity.  Deitra Mayo, MD, [place] [phone] 06/30/2014

## 2014-07-27 NOTE — Transfer of Care (Signed)
Immediate Anesthesia Transfer of Care Note  Patient: [name]  Procedure(s) Performed: Procedure(s): [place] (Right)  Patient Location: PACU  Anesthesia Type:MAC  Level of Consciousness: awake, alert , oriented and patient cooperative  Airway & Oxygen Therapy: Patient Spontanous Breathing and Patient connected to nasal cannula oxygen  Post-op Assessment: Report given to PACU RN, Post -op Vital signs reviewed and stable and Patient moving all extremities  Post vital signs: Reviewed and stable  Complications: No apparent anesthesia complications

## 2014-07-27 NOTE — Anesthesia Postprocedure Evaluation (Signed)
  Anesthesia Post-op Note  Patient: [name]  Procedure(s) Performed: Procedure(s): BASCILIC VEIN TRANSPOSITION (Right)  Patient Location: PACU  Anesthesia Type:MAC  Level of Consciousness: awake, alert  and oriented  Airway and Oxygen Therapy: Patient Spontanous Breathing  Post-op Pain: none  Post-op Assessment: Post-op Vital signs reviewed  Post-op Vital Signs: Reviewed  Last Vitals:  Filed Vitals:   07/27/14 1415  BP:   Pulse: 52  Temp:   Resp:     Complications: No apparent anesthesia complications

## 2014-07-27 NOTE — Anesthesia Procedure Notes (Signed)
Procedure Name: MAC Date/Time: 07/27/2014 10:30 AM Performed by: Julian Reil Pre-anesthesia Checklist: Patient identified, Emergency Drugs available, Suction available and Patient being monitored Patient Re-evaluated:Patient Re-evaluated prior to inductionOxygen Delivery Method: Simple face mask Placement Confirmation: positive ETCO2

## 2014-07-27 NOTE — Interval H&P Note (Signed)
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 After consideration of risks, benefits and other options for treatment, the patient has consented to  Procedure(s): ARTERIOVENOUS (AV) FISTULA CREATION VERSUS GRAFT INSERTION (Right) as a surgical intervention .  The patient's history has been reviewed, patient examined, no change in status, stable for surgery.  I have reviewed the patient's chart and labs.  Questions were answered to the patient's satisfaction.     Suraiya Dickerson S

## 2014-07-27 NOTE — Op Note (Signed)
    NAME: [name]   MRN: [id number] DOB: 1941/01/30    DATE OF OPERATION: 07/27/2014  PREOP DIAGNOSIS: Stage IV chronic kidney disease  POSTOP DIAGNOSIS: Same  PROCEDURE: Right basilic vein transposition  SURGEON: Judeth Cornfield. Scot Dock, MD, FACS  ASSIST: Leontine Locket, PA  ANESTHESIA: local with sedation   EBL: minimal  INDICATIONS: [name] is a 73 y.o. male who presents for new access.  FINDINGS: 4 mm basilic vein  TECHNIQUE: The patient was taken to the operating room and sedated by anesthesia. The right upper extremity was prepped and draped in usual sterile fashion. By ultrasound the basilic vein looked reasonable in size. After the skin was anesthetized, a longitudinal incision was made over  the basilic vein just above the antecubital level. Here the vein was dissected free. Distally it branched multiple times. Using 2 additional incisions along the medial aspect of the right upper arm, after the skin was anesthetized, the basilic vein was harvested up to the axilla where it emptied into the brachial vein. Branches were divided between clips and 3-0 silk ties. The vein was gently distended and then marked to prevent twisting. Through the distal incision, the brachial artery was dissected free. A tunnel was created between this incision and the axillary incision and the patient was then heparinized. The vein was then brought to the tunnel for anastomosis to the brachial artery. The brachial artery was clamped proximally and distally and longitudinal arteriotomy was made. The vein was sewn in side to the artery using continuous 6-0 Prolene suture. After completion and was an excellent thrill in the fistula and a palpable radial pulse. Hemostasis was obtained in the wounds. Each of the wounds was closed with 2 deep layers of 3-0 Vicryl and the skin closed with 4-0 Vicryl. Dermabond was applied. The patient tolerated the procedure well and was transferred to the recovery room in  stable condition. All needle and sponge counts were correct.  Deitra Mayo, MD, FACS [facility]  DATE OF DICTATION:   07/27/2014

## 2014-07-27 NOTE — Progress Notes (Signed)
Called Dr.Fitzgerald for sign out

## 2014-07-27 NOTE — Discharge Instructions (Signed)
° ° °  07/27/2014 AMEER CABALLEROS [id number] [birth year]  Surgeon(s): Angelia Mould, MD  Procedure(s): BASCILIC VEIN TRANSPOSITION-RIGHT ARM   Do not stick fistula for 12 weeks   What to eat:  For your first meals, you should eat lightly; only small meals initially.  If you do not have nausea, you may eat larger meals.  Avoid spicy, greasy and heavy food.    General Anesthesia, Adult, Care After  Refer to this sheet in the next few weeks. These instructions provide you with information on caring for yourself after your procedure. Your health care provider may also give you more specific instructions. Your treatment has been planned according to current medical practices, but problems sometimes occur. Call your health care provider if you have any problems or questions after your procedure.  WHAT TO EXPECT AFTER THE PROCEDURE  After the procedure, it is typical to experience:  Sleepiness.  Nausea and vomiting. HOME CARE INSTRUCTIONS  For the first 24 hours after general anesthesia:  Have a responsible person with you.  Do not drive a car. If you are alone, do not take public transportation.  Do not drink alcohol.  Do not take medicine that has not been prescribed by your health care provider.  Do not sign important papers or make important decisions.  You may resume a normal diet and activities as directed by your health care provider.  Change bandages (dressings) as directed.  If you have questions or problems that seem related to general anesthesia, call the hospital and ask for the anesthetist or anesthesiologist on call. SEEK MEDICAL CARE IF:  You have nausea and vomiting that continue the day after anesthesia.  You develop a rash. SEEK IMMEDIATE MEDICAL CARE IF:  You have difficulty breathing.  You have chest pain.  You have any allergic problems. Document Released: 12/17/2000 Document Revised: 05/13/2013 Document Reviewed: 03/26/2013  [facility] Patient Information  2014 [place], [place].

## 2014-07-27 NOTE — Progress Notes (Signed)
Unable to feel thrill but hear bruit and verified by second RN [name] RN

## 2014-07-27 NOTE — Anesthesia Preprocedure Evaluation (Addendum)
Anesthesia Evaluation  Patient identified by MRN, date of birth, ID band Patient awake    Reviewed: Allergy & Precautions, H&P , NPO status , Patient's Chart, lab work & pertinent test results  Airway Mallampati: III  TM Distance: >3 FB Neck ROM: Full    Dental  (+) Partial Upper   Pulmonary former smoker,          Cardiovascular hypertension, Pt. on medications + Peripheral Vascular Disease     Neuro/Psych negative neurological ROS  negative psych ROS   GI/Hepatic Neg liver ROS, GERD-  ,  Endo/Other  diabetes, Type 2, Insulin DependentMorbid obesity  Renal/GU CRFRenal disease     Musculoskeletal  (+) Arthritis -,   Abdominal   Peds  Hematology  (+) anemia ,   Anesthesia Other Findings   Reproductive/Obstetrics                            Anesthesia Physical Anesthesia Plan  ASA: III  Anesthesia Plan: MAC   Post-op Pain Management:    Induction: Intravenous  Airway Management Planned: Simple Face Mask and Natural Airway  Additional Equipment:   Intra-op Plan:   Post-operative Plan:   Informed Consent: I have reviewed the patients History and Physical, chart, labs and discussed the procedure including the risks, benefits and alternatives for the proposed anesthesia with the patient or authorized representative who has indicated his/her understanding and acceptance.   Dental advisory given  Plan Discussed with: CRNA and Surgeon  Anesthesia Plan Comments:        Anesthesia Quick Evaluation

## 2014-07-27 NOTE — Telephone Encounter (Addendum)
-----   Message from Mena Goes, RN sent at 07/27/2014  1:55 PM EST ----- Regarding: Schedule   ----- Message -----    From: Angelia Mould, MD    Sent: 07/27/2014   1:02 PM      To: [facility] Charge Pool Subject: charge and f/u                                 PROCEDURE: Right basilic vein transposition  SURGEON: Judeth Cornfield. Scot Dock, MD, FACS  ASSIST: Leontine Locket, PA  He will need a follow up visit in 6 weeks with a duplex of his fistula to check on the maturation of his fistula. Thank you. CD   07/27/14: lm for pt re appt, dpm

## 2014-07-28 ENCOUNTER — Encounter (HOSPITAL_COMMUNITY): Payer: Self-pay | Admitting: Vascular Surgery

## 2014-07-28 LAB — GLUCOSE, CAPILLARY: Glucose-Capillary: 113 mg/dL — ABNORMAL HIGH (ref 70–99)

## 2014-08-05 ENCOUNTER — Other Ambulatory Visit: Payer: Medicare Other | Admitting: Internal Medicine

## 2014-08-05 DIAGNOSIS — E039 Hypothyroidism, unspecified: Secondary | ICD-10-CM

## 2014-08-05 DIAGNOSIS — N186 End stage renal disease: Secondary | ICD-10-CM

## 2014-08-05 DIAGNOSIS — E119 Type 2 diabetes mellitus without complications: Secondary | ICD-10-CM | POA: Diagnosis not present

## 2014-08-05 LAB — TSH: TSH: 4.899 u[IU]/mL — ABNORMAL HIGH (ref 0.350–4.500)

## 2014-08-05 LAB — HEPATIC FUNCTION PANEL
ALT: <8 U/L (ref 0–53)
AST: 12 U/L (ref 0–37)
Albumin: 3.2 g/dL — ABNORMAL LOW (ref 3.5–5.2)
Alkaline Phosphatase: 30 U/L — ABNORMAL LOW (ref 39–117)
Bilirubin, Direct: 0.1 mg/dL (ref 0.0–0.3)
Indirect Bilirubin: 0.3 mg/dL (ref 0.2–1.2)
Total Bilirubin: 0.4 mg/dL (ref 0.2–1.2)
Total Protein: 5.4 g/dL — ABNORMAL LOW (ref 6.0–8.3)

## 2014-08-05 LAB — LIPID PANEL
Cholesterol: 74 mg/dL (ref 0–200)
HDL: 34 mg/dL — ABNORMAL LOW (ref >39)
LDL Cholesterol: 26 mg/dL (ref 0–99)
Total CHOL/HDL Ratio: 2.2 Ratio
Triglycerides: 68 mg/dL (ref <150)
VLDL: 14 mg/dL (ref 0–40)

## 2014-08-06 ENCOUNTER — Encounter: Payer: Self-pay | Admitting: Internal Medicine

## 2014-08-06 ENCOUNTER — Ambulatory Visit (INDEPENDENT_AMBULATORY_CARE_PROVIDER_SITE_OTHER): Payer: Medicare Other | Admitting: Internal Medicine

## 2014-08-06 VITALS — BP 134/60 | HR 67 | Temp 98.3°F | Wt 231.0 lb

## 2014-08-06 DIAGNOSIS — J069 Acute upper respiratory infection, unspecified: Secondary | ICD-10-CM

## 2014-08-06 DIAGNOSIS — E785 Hyperlipidemia, unspecified: Secondary | ICD-10-CM | POA: Diagnosis not present

## 2014-08-06 DIAGNOSIS — I150 Renovascular hypertension: Secondary | ICD-10-CM

## 2014-08-06 DIAGNOSIS — E039 Hypothyroidism, unspecified: Secondary | ICD-10-CM

## 2014-08-06 DIAGNOSIS — N2581 Secondary hyperparathyroidism of renal origin: Secondary | ICD-10-CM

## 2014-08-06 DIAGNOSIS — N186 End stage renal disease: Secondary | ICD-10-CM | POA: Diagnosis not present

## 2014-08-06 DIAGNOSIS — E119 Type 2 diabetes mellitus without complications: Secondary | ICD-10-CM | POA: Diagnosis not present

## 2014-08-06 DIAGNOSIS — Z8719 Personal history of other diseases of the digestive system: Secondary | ICD-10-CM | POA: Diagnosis not present

## 2014-08-06 DIAGNOSIS — I70209 Unspecified atherosclerosis of native arteries of extremities, unspecified extremity: Secondary | ICD-10-CM

## 2014-08-06 LAB — HEMOGLOBIN A1C
Hgb A1c MFr Bld: 6 % — ABNORMAL HIGH (ref <5.7)
Mean Plasma Glucose: 126 mg/dL — ABNORMAL HIGH (ref <117)

## 2014-08-06 MED ORDER — AZITHROMYCIN 250 MG PO TABS: ORAL_TABLET | ORAL | Status: DC

## 2014-08-06 MED ORDER — LEVOTHYROXINE SODIUM 50 MCG PO TABS: 50.0000 ug | ORAL_TABLET | Freq: Every day | ORAL | Status: AC

## 2014-08-06 NOTE — Progress Notes (Signed)
   Subjective:    Patient ID: [name], male    DOB: 12/13/1940, 73 y.o.   MRN: [id number]  HPI  Has had headache and nasal congestion.Thought it was allergic rhinitis. Had sore throat one day. Complaint of rhinorrhea. Saw Dr. Lorrene Reid recently. He really is here for his six-month recheck today and just happens to be sick. His a history of chronic kidney disease which is worsened. He has had an AV fistula placed. He has secondary hyperparathyroidism, hypertension, hyperlipidemia. He has a history of diverticulitis and arteriosclerotic peripheral vascular disease. He has type 2 diabetes mellitus.  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 History of gout, erectile dysfunction, allergic rhinitis.  Baseline creatinine initially was around 2.0. He sees urologist for erectile dysfunction and has his prostate checked there annually. He takes insulin is on oral medication for diabetes.  Best hemoglobin A1c was 6.7% in 2008. Worst I've seen is 7.6% in April 2009. He continues to work part-time at [employer] about 20 hours a week.  He had left lower lobe pneumonia September 2015.  Review of Systems     Objective:   Physical Exam TMs are slightly full. Pharynx slightly injected. Neck supple. 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 Begin Synthroid 0.05 mg daily and recheck in 2 months  Hyperlipidemia-stable  Arteriosclerotic peripheral vascular disease  History of diverticulitis

## 2014-08-17 ENCOUNTER — Ambulatory Visit (INDEPENDENT_AMBULATORY_CARE_PROVIDER_SITE_OTHER): Payer: Medicare Other | Admitting: Internal Medicine

## 2014-08-17 DIAGNOSIS — E538 Deficiency of other specified B group vitamins: Secondary | ICD-10-CM | POA: Diagnosis not present

## 2014-08-17 MED ORDER — CYANOCOBALAMIN 1000 MCG/ML IJ SOLN
1000.0000 ug | Freq: Once | INTRAMUSCULAR | Status: AC
  Administered 2014-08-17: 1000 ug via INTRAMUSCULAR

## 2014-09-02 ENCOUNTER — Encounter (HOSPITAL_COMMUNITY): Payer: Self-pay | Admitting: Vascular Surgery

## 2014-09-07 ENCOUNTER — Encounter: Payer: Self-pay | Admitting: Vascular Surgery

## 2014-09-08 ENCOUNTER — Encounter: Payer: Self-pay | Admitting: Vascular Surgery

## 2014-09-08 ENCOUNTER — Ambulatory Visit (HOSPITAL_COMMUNITY)
Admission: RE | Admit: 2014-09-08 | Discharge: 2014-09-08 | Disposition: A | Discharge status: Home / Self Care | Payer: Medicare Other | Source: Ambulatory Visit | Attending: Vascular Surgery | Admitting: Vascular Surgery

## 2014-09-08 ENCOUNTER — Ambulatory Visit (INDEPENDENT_AMBULATORY_CARE_PROVIDER_SITE_OTHER): Payer: Medicare Other | Admitting: Vascular Surgery

## 2014-09-08 VITALS — BP 135/51 | HR 55 | Ht 70.5 in | Wt 234.5 lb

## 2014-09-08 DIAGNOSIS — N2581 Secondary hyperparathyroidism of renal origin: Secondary | ICD-10-CM | POA: Diagnosis not present

## 2014-09-08 DIAGNOSIS — N186 End stage renal disease: Secondary | ICD-10-CM

## 2014-09-08 DIAGNOSIS — N185 Chronic kidney disease, stage 5: Secondary | ICD-10-CM | POA: Diagnosis not present

## 2014-09-08 DIAGNOSIS — M109 Gout, unspecified: Secondary | ICD-10-CM | POA: Diagnosis not present

## 2014-09-08 DIAGNOSIS — N184 Chronic kidney disease, stage 4 (severe): Secondary | ICD-10-CM

## 2014-09-08 DIAGNOSIS — D631 Anemia in chronic kidney disease: Secondary | ICD-10-CM | POA: Diagnosis not present

## 2014-09-08 DIAGNOSIS — Z4931 Encounter for adequacy testing for hemodialysis: Secondary | ICD-10-CM | POA: Diagnosis not present

## 2014-09-08 DIAGNOSIS — I120 Hypertensive chronic kidney disease with stage 5 chronic kidney disease or end stage renal disease: Secondary | ICD-10-CM | POA: Diagnosis not present

## 2014-09-08 NOTE — Assessment & Plan Note (Signed)
His right basilic vein transposition appears to be maturing adequately. I think it should be ready for use in the next 6 weeks or so. I've encouraged him to continue to exercise his hand and I'll see him back as needed.

## 2014-09-08 NOTE — Progress Notes (Signed)
   Patient name: GREOGRY PFAHL MRN: [id number] DOB: 1941/07/22 Sex: male  REASON FOR VISIT: Follow up after right basilic vein transposition  HPI: [name] is a 73 y.o. male who underwent a right basilic vein transposition on 07/27/2014. He comes in for a 6 week follow up visit. He is not yet on dialysis. He denies any significant pain in his right upper extremity. He denies fever.  REVIEW OF SYSTEMS: [name] ] denotes positive finding; [  ] denotes negative finding  CARDIOVASCULAR:  [ ]  chest pain   [ ]  dyspnea on exertion    CONSTITUTIONAL:  [ ]  fever   [ ]  chills  PHYSICAL EXAM: Filed Vitals:   09/08/14 1339  BP: 135/51  Pulse: 55  Height: 5' 10.5" (1.791 m)  Weight: 234 lb 8 oz (106.369 kg)  SpO2: 100%   Body mass index is 33.16 kg/(m^2). GENERAL: The patient is a well-nourished male, in no acute distress. The vital signs are documented above. CARDIOVASCULAR: There is a regular rate and rhythm. PULMONARY: There is good air exchange bilaterally without wheezing or rales. His right upper arm fistula has an excellent bruit and thrill. He has a palpable right radial pulse.  I have independently interpreted his duplex of his fistula which shows that the diameters range from 0.55 cm-0.67 cm. There is one area with some minimal narrowing in the mid portion of the fistula however the vein is not pulsatile proximal to this I do not think this is clinically significant.  MEDICAL ISSUES:  Chronic kidney disease (CKD), stage IV (severe) His right basilic vein transposition appears to be maturing adequately. I think it should be ready for use in the next 6 weeks or so. I've encouraged him to continue to exercise his hand and I'll see him back as needed.    [place] [facility] [place] Beeper: [phone]

## 2014-09-15 NOTE — Discharge Instructions (Signed)
Darbepoetin Alfa injection What is this medicine? DARBEPOETIN ALFA (dar be POE e tin AL fa) helps your body make more red blood cells. It is used to treat anemia caused by chronic kidney failure and chemotherapy. This medicine may be used for other purposes; ask your health care provider or pharmacist if you have questions. COMMON BRAND NAME(S): Aranesp What should I tell my health care provider before I take this medicine? They need to know if you have any of these conditions: -blood clotting disorders or history of blood clots -cancer patient not on chemotherapy -cystic fibrosis -heart disease, such as angina, heart failure, or a history of a heart attack -hemoglobin level of 12 g/dL or greater -high blood pressure -low levels of folate, iron, or vitamin B12 -seizures -an unusual or allergic reaction to darbepoetin, erythropoietin, albumin, hamster proteins, latex, other medicines, foods, dyes, or preservatives -pregnant or trying to get pregnant -breast-feeding How should I use this medicine? This medicine is for injection into a vein or under the skin. It is usually given by a health care professional in a hospital or clinic setting. If you get this medicine at home, you will be taught how to prepare and give this medicine. Do not shake the solution before you withdraw a dose. Use exactly as directed. Take your medicine at regular intervals. Do not take your medicine more often than directed. It is important that you put your used needles and syringes in a special sharps container. Do not put them in a trash can. If you do not have a sharps container, call your pharmacist or healthcare provider to get one. Talk to your pediatrician regarding the use of this medicine in children. While this medicine may be used in children as young as 1 year for selected conditions, precautions do apply. Overdosage: If you think you have taken too much of this medicine contact a poison control center or  emergency room at once. NOTE: This medicine is only for you. Do not share this medicine with others. What if I miss a dose? If you miss a dose, take it as soon as you can. If it is almost time for your next dose, take only that dose. Do not take double or extra doses. What may interact with this medicine? Do not take this medicine with any of the following medications: -epoetin alfa This list may not describe all possible interactions. Give your health care provider a list of all the medicines, herbs, non-prescription drugs, or dietary supplements you use. Also tell them if you smoke, drink alcohol, or use illegal drugs. Some items may interact with your medicine. What should I watch for while using this medicine? Visit your prescriber or health care professional for regular checks on your progress and for the needed blood tests and blood pressure measurements. It is especially important for the doctor to make sure your hemoglobin level is in the desired range, to limit the risk of potential side effects and to give you the best benefit. Keep all appointments for any recommended tests. Check your blood pressure as directed. Ask your doctor what your blood pressure should be and when you should contact him or her. As your body makes more red blood cells, you may need to take iron, folic acid, or vitamin B supplements. Ask your doctor or health care provider which products are right for you. If you have kidney disease continue dietary restrictions, even though this medication can make you feel better. Talk with your doctor or health   care professional about the foods you eat and the vitamins that you take. What side effects may I notice from receiving this medicine? Side effects that you should report to your doctor or health care professional as soon as possible: -allergic reactions like skin rash, itching or hives, swelling of the face, lips, or tongue -breathing problems -changes in vision -chest  pain -confusion, trouble speaking or understanding -feeling faint or lightheaded, falls -high blood pressure -muscle aches or pains -pain, swelling, warmth in the leg -rapid weight gain -severe headaches -sudden numbness or weakness of the face, arm or leg -trouble walking, dizziness, loss of balance or coordination -seizures (convulsions) -swelling of the ankles, feet, hands -unusually weak or tired Side effects that usually do not require medical attention (report to your doctor or health care professional if they continue or are bothersome): -diarrhea -fever, chills (flu-like symptoms) -headaches -nausea, vomiting -redness, stinging, or swelling at site where injected This list may not describe all possible side effects. Call your doctor for medical advice about side effects. You may report side effects to FDA at 1-800-FDA-1088. Where should I keep my medicine? Keep out of the reach of children. Store in a refrigerator between 2 and 8 degrees C (36 and 46 degrees F). Do not freeze. Do not shake. Throw away any unused portion if using a single-dose vial. Throw away any unused medicine after the expiration date. NOTE: This sheet is a summary. It may not cover all possible information. If you have questions about this medicine, talk to your doctor, pharmacist, or health care provider.  2015, [employer]vier/[employer]. (2008-08-24 10:23:57)  

## 2014-09-20 ENCOUNTER — Encounter (HOSPITAL_COMMUNITY)
Admission: RE | Admit: 2014-09-20 | Discharge: 2014-09-20 | Disposition: A | Discharge status: Home / Self Care | Payer: Medicare Other | Source: Ambulatory Visit | Attending: Nephrology | Admitting: Nephrology

## 2014-09-20 DIAGNOSIS — D638 Anemia in other chronic diseases classified elsewhere: Secondary | ICD-10-CM | POA: Diagnosis not present

## 2014-09-20 DIAGNOSIS — N184 Chronic kidney disease, stage 4 (severe): Secondary | ICD-10-CM | POA: Insufficient documentation

## 2014-09-20 LAB — IRON AND TIBC
Iron: 72 ug/dL (ref 42–165)
Saturation Ratios: 25 % (ref 20–55)
TIBC: 286 ug/dL (ref 215–435)
UIBC: 214 ug/dL (ref 125–400)

## 2014-09-20 LAB — POCT HEMOGLOBIN-HEMACUE: Hemoglobin: 8.6 g/dL — ABNORMAL LOW (ref 13.0–17.0)

## 2014-09-20 LAB — FERRITIN: Ferritin: 136 ng/mL (ref 22–322)

## 2014-09-20 MED ORDER — DARBEPOETIN ALFA 100 MCG/0.5ML IJ SOSY
PREFILLED_SYRINGE | INTRAMUSCULAR | Status: AC
  Filled 2014-09-20: qty 0.5

## 2014-09-20 MED ORDER — DARBEPOETIN ALFA 100 MCG/0.5ML IJ SOSY
100.0000 ug | PREFILLED_SYRINGE | INTRAMUSCULAR | Status: DC
  Administered 2014-09-20: 100 ug via SUBCUTANEOUS

## 2014-09-21 ENCOUNTER — Telehealth: Payer: Self-pay | Admitting: Internal Medicine

## 2014-09-21 ENCOUNTER — Other Ambulatory Visit: Payer: Medicare Other | Admitting: Internal Medicine

## 2014-09-21 NOTE — Telephone Encounter (Signed)
Hgb low at 8.6 grams. Nephrology ordered transfusion. Pt may have B12  IM next week. Cannot come today. His car is broken.

## 2014-09-28 ENCOUNTER — Other Ambulatory Visit (INDEPENDENT_AMBULATORY_CARE_PROVIDER_SITE_OTHER): Payer: Medicare Other | Admitting: Internal Medicine

## 2014-09-28 DIAGNOSIS — E538 Deficiency of other specified B group vitamins: Secondary | ICD-10-CM

## 2014-09-28 DIAGNOSIS — E039 Hypothyroidism, unspecified: Secondary | ICD-10-CM | POA: Diagnosis not present

## 2014-09-28 MED ORDER — CYANOCOBALAMIN 1000 MCG/ML IJ SOLN
1000.0000 ug | Freq: Once | INTRAMUSCULAR | Status: AC
  Administered 2014-09-28: 1000 ug via INTRAMUSCULAR

## 2014-09-29 LAB — TSH: TSH: 2.681 u[IU]/mL (ref 0.350–4.500)

## 2014-10-04 ENCOUNTER — Inpatient Hospital Stay (HOSPITAL_COMMUNITY): Admission: RE | Admit: 2014-10-04 | Payer: Medicare Other | Source: Ambulatory Visit

## 2014-10-05 ENCOUNTER — Other Ambulatory Visit: Payer: Self-pay

## 2014-10-05 ENCOUNTER — Telehealth: Payer: Self-pay | Admitting: Internal Medicine

## 2014-10-05 ENCOUNTER — Encounter (HOSPITAL_COMMUNITY): Payer: Self-pay | Admitting: *Deleted

## 2014-10-05 ENCOUNTER — Emergency Department (HOSPITAL_COMMUNITY): Payer: Medicare Other

## 2014-10-05 ENCOUNTER — Inpatient Hospital Stay (HOSPITAL_COMMUNITY)
Admission: EM | Admit: 2014-10-05 | Discharge: 2014-10-08 | DRG: 683 | Disposition: A | Discharge status: Home / Self Care | Payer: Medicare Other | Attending: Internal Medicine | Admitting: Internal Medicine

## 2014-10-05 DIAGNOSIS — Z87891 Personal history of nicotine dependence: Secondary | ICD-10-CM

## 2014-10-05 DIAGNOSIS — J40 Bronchitis, not specified as acute or chronic: Secondary | ICD-10-CM | POA: Diagnosis present

## 2014-10-05 DIAGNOSIS — Z6832 Body mass index (BMI) 32.0-32.9, adult: Secondary | ICD-10-CM

## 2014-10-05 DIAGNOSIS — E039 Hypothyroidism, unspecified: Secondary | ICD-10-CM | POA: Diagnosis present

## 2014-10-05 DIAGNOSIS — I120 Hypertensive chronic kidney disease with stage 5 chronic kidney disease or end stage renal disease: Principal | ICD-10-CM | POA: Diagnosis present

## 2014-10-05 DIAGNOSIS — E118 Type 2 diabetes mellitus with unspecified complications: Secondary | ICD-10-CM

## 2014-10-05 DIAGNOSIS — N2589 Other disorders resulting from impaired renal tubular function: Secondary | ICD-10-CM | POA: Diagnosis present

## 2014-10-05 DIAGNOSIS — K219 Gastro-esophageal reflux disease without esophagitis: Secondary | ICD-10-CM | POA: Diagnosis present

## 2014-10-05 DIAGNOSIS — Z880 Allergy status to penicillin: Secondary | ICD-10-CM | POA: Diagnosis not present

## 2014-10-05 DIAGNOSIS — M109 Gout, unspecified: Secondary | ICD-10-CM | POA: Diagnosis present

## 2014-10-05 DIAGNOSIS — E1121 Type 2 diabetes mellitus with diabetic nephropathy: Secondary | ICD-10-CM | POA: Diagnosis present

## 2014-10-05 DIAGNOSIS — D631 Anemia in chronic kidney disease: Secondary | ICD-10-CM | POA: Diagnosis present

## 2014-10-05 DIAGNOSIS — J302 Other seasonal allergic rhinitis: Secondary | ICD-10-CM | POA: Diagnosis present

## 2014-10-05 DIAGNOSIS — E11319 Type 2 diabetes mellitus with unspecified diabetic retinopathy without macular edema: Secondary | ICD-10-CM | POA: Diagnosis present

## 2014-10-05 DIAGNOSIS — Z7982 Long term (current) use of aspirin: Secondary | ICD-10-CM

## 2014-10-05 DIAGNOSIS — N2581 Secondary hyperparathyroidism of renal origin: Secondary | ICD-10-CM | POA: Diagnosis present

## 2014-10-05 DIAGNOSIS — E8770 Fluid overload, unspecified: Secondary | ICD-10-CM | POA: Diagnosis present

## 2014-10-05 DIAGNOSIS — M1990 Unspecified osteoarthritis, unspecified site: Secondary | ICD-10-CM | POA: Diagnosis present

## 2014-10-05 DIAGNOSIS — I739 Peripheral vascular disease, unspecified: Secondary | ICD-10-CM | POA: Diagnosis present

## 2014-10-05 DIAGNOSIS — E875 Hyperkalemia: Secondary | ICD-10-CM | POA: Diagnosis not present

## 2014-10-05 DIAGNOSIS — N185 Chronic kidney disease, stage 5: Secondary | ICD-10-CM | POA: Diagnosis present

## 2014-10-05 DIAGNOSIS — I10 Essential (primary) hypertension: Secondary | ICD-10-CM | POA: Diagnosis not present

## 2014-10-05 DIAGNOSIS — N400 Enlarged prostate without lower urinary tract symptoms: Secondary | ICD-10-CM | POA: Diagnosis present

## 2014-10-05 DIAGNOSIS — IMO0001 Reserved for inherently not codable concepts without codable children: Secondary | ICD-10-CM | POA: Diagnosis present

## 2014-10-05 DIAGNOSIS — Z794 Long term (current) use of insulin: Secondary | ICD-10-CM | POA: Diagnosis not present

## 2014-10-05 DIAGNOSIS — E669 Obesity, unspecified: Secondary | ICD-10-CM | POA: Diagnosis present

## 2014-10-05 DIAGNOSIS — N184 Chronic kidney disease, stage 4 (severe): Secondary | ICD-10-CM | POA: Diagnosis not present

## 2014-10-05 DIAGNOSIS — R0602 Shortness of breath: Secondary | ICD-10-CM | POA: Diagnosis not present

## 2014-10-05 DIAGNOSIS — E785 Hyperlipidemia, unspecified: Secondary | ICD-10-CM | POA: Diagnosis present

## 2014-10-05 DIAGNOSIS — R001 Bradycardia, unspecified: Secondary | ICD-10-CM | POA: Diagnosis present

## 2014-10-05 DIAGNOSIS — E119 Type 2 diabetes mellitus without complications: Secondary | ICD-10-CM | POA: Diagnosis present

## 2014-10-05 LAB — URINALYSIS, ROUTINE W REFLEX MICROSCOPIC
Bilirubin Urine: NEGATIVE
Glucose, UA: NEGATIVE mg/dL
Ketones, ur: NEGATIVE mg/dL
Leukocytes, UA: NEGATIVE
Nitrite: NEGATIVE
Protein, ur: 100 mg/dL — AB
Specific Gravity, Urine: 1.009 (ref 1.005–1.030)
Urobilinogen, UA: 0.2 mg/dL (ref 0.0–1.0)
pH: 6.5 (ref 5.0–8.0)

## 2014-10-05 LAB — COMPREHENSIVE METABOLIC PANEL
ALT: 10 U/L (ref 0–53)
AST: 21 U/L (ref 0–37)
Albumin: 3.1 g/dL — ABNORMAL LOW (ref 3.5–5.2)
Alkaline Phosphatase: 32 U/L — ABNORMAL LOW (ref 39–117)
Anion gap: 12 (ref 5–15)
BUN: 56 mg/dL — ABNORMAL HIGH (ref 6–23)
CO2: 22 mmol/L (ref 19–32)
Calcium: 9.7 mg/dL (ref 8.4–10.5)
Chloride: 106 mEq/L (ref 96–112)
Creatinine, Ser: 5.76 mg/dL — ABNORMAL HIGH (ref 0.50–1.35)
GFR calc Af Amer: 10 mL/min — ABNORMAL LOW (ref >90)
GFR calc non Af Amer: 9 mL/min — ABNORMAL LOW (ref >90)
Glucose, Bld: 101 mg/dL — ABNORMAL HIGH (ref 70–99)
Potassium: 6.1 mmol/L (ref 3.5–5.1)
Sodium: 140 mmol/L (ref 135–145)
Total Bilirubin: 0.6 mg/dL (ref 0.3–1.2)
Total Protein: 5.9 g/dL — ABNORMAL LOW (ref 6.0–8.3)

## 2014-10-05 LAB — CBC
HCT: 27.8 % — ABNORMAL LOW (ref 39.0–52.0)
Hemoglobin: 9 g/dL — ABNORMAL LOW (ref 13.0–17.0)
MCH: 32.6 pg (ref 26.0–34.0)
MCHC: 32.4 g/dL (ref 30.0–36.0)
MCV: 100.7 fL — ABNORMAL HIGH (ref 78.0–100.0)
Platelets: 134 10*3/uL — ABNORMAL LOW (ref 150–400)
RBC: 2.76 MIL/uL — ABNORMAL LOW (ref 4.22–5.81)
RDW: 17 % — ABNORMAL HIGH (ref 11.5–15.5)
WBC: 5.7 10*3/uL (ref 4.0–10.5)

## 2014-10-05 LAB — BASIC METABOLIC PANEL
Anion gap: 9 (ref 5–15)
BUN: 56 mg/dL — ABNORMAL HIGH (ref 6–23)
CO2: 24 mmol/L (ref 19–32)
Calcium: 10 mg/dL (ref 8.4–10.5)
Chloride: 107 mEq/L (ref 96–112)
Creatinine, Ser: 5.59 mg/dL — ABNORMAL HIGH (ref 0.50–1.35)
GFR calc Af Amer: 10 mL/min — ABNORMAL LOW (ref >90)
GFR calc non Af Amer: 9 mL/min — ABNORMAL LOW (ref >90)
Glucose, Bld: 58 mg/dL — ABNORMAL LOW (ref 70–99)
Potassium: 5 mmol/L (ref 3.5–5.1)
Sodium: 140 mmol/L (ref 135–145)

## 2014-10-05 LAB — I-STAT TROPONIN, ED: Troponin i, poc: 0.02 ng/mL (ref 0.00–0.08)

## 2014-10-05 LAB — CBG MONITORING, ED
Glucose-Capillary: 81 mg/dL (ref 70–99)
Glucose-Capillary: 133 mg/dL — ABNORMAL HIGH (ref 70–99)

## 2014-10-05 LAB — URINE MICROSCOPIC-ADD ON

## 2014-10-05 LAB — GLUCOSE, CAPILLARY
Glucose-Capillary: 25 mg/dL — CL (ref 70–99)
Glucose-Capillary: 69 mg/dL — ABNORMAL LOW (ref 70–99)
Glucose-Capillary: 82 mg/dL (ref 70–99)

## 2014-10-05 LAB — BRAIN NATRIURETIC PEPTIDE: B Natriuretic Peptide: 862.2 pg/mL — ABNORMAL HIGH (ref 0.0–100.0)

## 2014-10-05 MED ORDER — ACETAMINOPHEN 650 MG RE SUPP: 650.0000 mg | Freq: Four times a day (QID) | RECTAL | Status: DC | PRN

## 2014-10-05 MED ORDER — DEXTROSE 50 % IV SOLN
1.0000 | Freq: Once | INTRAVENOUS | Status: AC
  Administered 2014-10-05: 50 mL via INTRAVENOUS
  Filled 2014-10-05: qty 50

## 2014-10-05 MED ORDER — SODIUM POLYSTYRENE SULFONATE 15 GM/60ML PO SUSP
15.0000 g | Freq: Once | ORAL | Status: DC
  Filled 2014-10-05: qty 60

## 2014-10-05 MED ORDER — DARBEPOETIN ALFA 150 MCG/0.3ML IJ SOSY: 150.0000 ug | PREFILLED_SYRINGE | INTRAMUSCULAR | Status: DC

## 2014-10-05 MED ORDER — ACETAMINOPHEN 325 MG PO TABS
650.0000 mg | ORAL_TABLET | Freq: Four times a day (QID) | ORAL | Status: DC | PRN
Start: 2014-10-05 — End: 2014-10-08

## 2014-10-05 MED ORDER — SIMVASTATIN 10 MG PO TABS
10.0000 mg | ORAL_TABLET | Freq: Every day | ORAL | Status: DC
  Administered 2014-10-05 – 2014-10-07 (×3): 10 mg via ORAL
  Filled 2014-10-05 (×4): qty 1

## 2014-10-05 MED ORDER — FUROSEMIDE 10 MG/ML IJ SOLN
80.0000 mg | Freq: Two times a day (BID) | INTRAMUSCULAR | Status: DC
Start: 2014-10-06 — End: 2014-10-05

## 2014-10-05 MED ORDER — ALBUTEROL SULFATE (2.5 MG/3ML) 0.083% IN NEBU
2.5000 mg | INHALATION_SOLUTION | Freq: Four times a day (QID) | RESPIRATORY_TRACT | Status: DC
  Administered 2014-10-05 – 2014-10-06 (×2): 2.5 mg via RESPIRATORY_TRACT
  Filled 2014-10-05 (×2): qty 3

## 2014-10-05 MED ORDER — SODIUM CHLORIDE 0.9 % IJ SOLN: 3.0000 mL | INTRAMUSCULAR | Status: DC | PRN

## 2014-10-05 MED ORDER — DEXTROSE 50 % IV SOLN
1.0000 | Freq: Once | INTRAVENOUS | Status: AC | PRN
  Administered 2014-10-05: 50 mL via INTRAVENOUS

## 2014-10-05 MED ORDER — CALCITRIOL 0.25 MCG PO CAPS
0.2500 ug | ORAL_CAPSULE | ORAL | Status: DC
  Administered 2014-10-07: 0.25 ug via ORAL
  Filled 2014-10-05: qty 1

## 2014-10-05 MED ORDER — SODIUM CHLORIDE 0.9 % IJ SOLN
3.0000 mL | Freq: Two times a day (BID) | INTRAMUSCULAR | Status: DC
  Administered 2014-10-05 – 2014-10-07 (×5): 3 mL via INTRAVENOUS

## 2014-10-05 MED ORDER — ASPIRIN 325 MG PO TABS
325.0000 mg | ORAL_TABLET | Freq: Two times a day (BID) | ORAL | Status: DC
  Administered 2014-10-05 – 2014-10-08 (×6): 325 mg via ORAL
  Filled 2014-10-05 (×7): qty 1

## 2014-10-05 MED ORDER — IPRATROPIUM BROMIDE 0.02 % IN SOLN
0.5000 mg | Freq: Once | RESPIRATORY_TRACT | Status: AC
  Administered 2014-10-05: 0.5 mg via RESPIRATORY_TRACT
  Filled 2014-10-05: qty 2.5

## 2014-10-05 MED ORDER — SODIUM CHLORIDE 0.9 % IV SOLN
1.0000 g | Freq: Once | INTRAVENOUS | Status: AC
  Administered 2014-10-05: 1 g via INTRAVENOUS
  Filled 2014-10-05 (×2): qty 10

## 2014-10-05 MED ORDER — SODIUM CHLORIDE 0.9 % IV SOLN
510.0000 mg | Freq: Once | INTRAVENOUS | Status: AC
  Administered 2014-10-05: 510 mg via INTRAVENOUS
  Filled 2014-10-05: qty 17

## 2014-10-05 MED ORDER — DEXTROSE 50 % IV SOLN
INTRAVENOUS | Status: AC
  Administered 2014-10-05: 25 mL
  Filled 2014-10-05: qty 50

## 2014-10-05 MED ORDER — INSULIN ASPART 100 UNIT/ML ~~LOC~~ SOLN
10.0000 [IU] | Freq: Once | SUBCUTANEOUS | Status: AC
  Administered 2014-10-05: 10 [IU] via SUBCUTANEOUS
  Filled 2014-10-05: qty 1

## 2014-10-05 MED ORDER — ALBUTEROL SULFATE (2.5 MG/3ML) 0.083% IN NEBU
5.0000 mg | INHALATION_SOLUTION | Freq: Once | RESPIRATORY_TRACT | Status: AC
  Administered 2014-10-05: 5 mg via RESPIRATORY_TRACT
  Filled 2014-10-05: qty 6

## 2014-10-05 MED ORDER — CALCITRIOL 0.5 MCG PO CAPS
0.5000 ug | ORAL_CAPSULE | ORAL | Status: DC
  Administered 2014-10-06 – 2014-10-08 (×2): 0.5 ug via ORAL
  Filled 2014-10-05 (×2): qty 1

## 2014-10-05 MED ORDER — SODIUM POLYSTYRENE SULFONATE 15 GM/60ML PO SUSP
30.0000 g | Freq: Once | ORAL | Status: AC
  Administered 2014-10-05: 30 g via ORAL
  Filled 2014-10-05: qty 120

## 2014-10-05 MED ORDER — ALBUTEROL SULFATE (2.5 MG/3ML) 0.083% IN NEBU: 2.5000 mg | INHALATION_SOLUTION | RESPIRATORY_TRACT | Status: DC | PRN

## 2014-10-05 MED ORDER — ALLOPURINOL 100 MG PO TABS
100.0000 mg | ORAL_TABLET | Freq: Every day | ORAL | Status: DC
  Administered 2014-10-06 – 2014-10-08 (×3): 100 mg via ORAL
  Filled 2014-10-05 (×3): qty 1

## 2014-10-05 MED ORDER — SEVELAMER CARBONATE 800 MG PO TABS
800.0000 mg | ORAL_TABLET | Freq: Three times a day (TID) | ORAL | Status: DC
  Administered 2014-10-06 – 2014-10-08 (×6): 800 mg via ORAL
  Filled 2014-10-05 (×10): qty 1

## 2014-10-05 MED ORDER — DOCUSATE SODIUM 100 MG PO CAPS
100.0000 mg | ORAL_CAPSULE | Freq: Two times a day (BID) | ORAL | Status: DC
  Administered 2014-10-05 – 2014-10-08 (×6): 100 mg via ORAL
  Filled 2014-10-05 (×8): qty 1

## 2014-10-05 MED ORDER — LEVOTHYROXINE SODIUM 50 MCG PO TABS
50.0000 ug | ORAL_TABLET | Freq: Every day | ORAL | Status: DC
  Administered 2014-10-06 – 2014-10-08 (×3): 50 ug via ORAL
  Filled 2014-10-05 (×4): qty 1

## 2014-10-05 MED ORDER — HEPARIN SODIUM (PORCINE) 5000 UNIT/ML IJ SOLN
5000.0000 [IU] | Freq: Three times a day (TID) | INTRAMUSCULAR | Status: DC
  Administered 2014-10-05 – 2014-10-08 (×8): 5000 [IU] via SUBCUTANEOUS
  Filled 2014-10-05 (×10): qty 1

## 2014-10-05 MED ORDER — FUROSEMIDE 10 MG/ML IJ SOLN
80.0000 mg | Freq: Once | INTRAMUSCULAR | Status: AC
  Administered 2014-10-05: 80 mg via INTRAVENOUS
  Filled 2014-10-05: qty 8

## 2014-10-05 MED ORDER — CALCITRIOL 0.25 MCG PO CAPS: 0.2500 ug | ORAL_CAPSULE | ORAL | Status: DC

## 2014-10-05 MED ORDER — FUROSEMIDE 80 MG PO TABS
160.0000 mg | ORAL_TABLET | Freq: Two times a day (BID) | ORAL | Status: DC
  Administered 2014-10-05 – 2014-10-08 (×6): 160 mg via ORAL
  Filled 2014-10-05 (×8): qty 2

## 2014-10-05 MED ORDER — SODIUM CHLORIDE 0.9 % IV SOLN: 250.0000 mL | INTRAVENOUS | Status: DC | PRN

## 2014-10-05 MED ORDER — HYDROCODONE-ACETAMINOPHEN 5-325 MG PO TABS: 1.0000 | ORAL_TABLET | ORAL | Status: DC | PRN

## 2014-10-05 MED ORDER — SODIUM BICARBONATE 650 MG PO TABS
1300.0000 mg | ORAL_TABLET | Freq: Two times a day (BID) | ORAL | Status: DC
  Administered 2014-10-05 – 2014-10-08 (×6): 1300 mg via ORAL
  Filled 2014-10-05 (×7): qty 2

## 2014-10-05 MED ORDER — SODIUM CHLORIDE 0.9 % IJ SOLN
3.0000 mL | Freq: Two times a day (BID) | INTRAMUSCULAR | Status: DC
  Administered 2014-10-06 – 2014-10-07 (×2): 3 mL via INTRAVENOUS

## 2014-10-05 MED ORDER — IPRATROPIUM BROMIDE 0.02 % IN SOLN
0.5000 mg | Freq: Four times a day (QID) | RESPIRATORY_TRACT | Status: DC
  Administered 2014-10-05 – 2014-10-06 (×2): 0.5 mg via RESPIRATORY_TRACT
  Filled 2014-10-05 (×2): qty 2.5

## 2014-10-05 NOTE — ED Notes (Signed)
Alecia Lemming PA notified of pts potassium level,

## 2014-10-05 NOTE — ED Notes (Signed)
Lab notified this nurse that pts potassium is 6.1

## 2014-10-05 NOTE — ED Notes (Signed)
Patient reports he has hx of pneumonia 3 mths ago.  He states he was around others with cold on Saturday.  Sunday night he noticed onset of cold sx.  Patient states since he continues to have more sob and cough.  Worse with activity.  Patient is a renal failure patient.  Not currently on dialysis.  Patient is wheezing at times.  Patient denies chest pain.

## 2014-10-05 NOTE — ED Provider Notes (Signed)
CSN: [id number]     Arrival date & time 10/05/14  1405 History   First MD Initiated Contact with Patient 10/05/14 1515     Chief Complaint  Patient presents with  . Shortness of Breath  . Cough     (Consider location/radiation/quality/duration/timing/severity/associated sxs/prior Treatment) HPI Comments: Patient with history of CKD on lasix, not yet on dialysis, DM, previous smoker -- presents with complaint of shortness of breath and cough for the past 2 days. Shortness of breath is worse with activity. He denies fever, URI symptoms, chest pain, nausea, vomiting, diarrhea. He is taking his Lasix and is urinating normally. He has baseline lower extremity edema without worsening. No orthopnea. No fevers however patient states that he awoke in the middle the night with sweats. Recent hospitalization several months ago for pneumonia. No history of COPD. Patient does not take any breathing medications at home. No treatments prior to arrival. Onset of symptoms gradual. Course is constant. Nothing makes symptoms better.  The history is provided by the patient and medical records.    Past Medical History  Diagnosis Date  . Hypertension   . Thyroid disease   . Renal insufficiency   . Hyperlipidemia   . ED (erectile dysfunction)   . Allergy   . Elevated homocysteine   . PVD (peripheral vascular disease)     has plastic aorta  . Arthritis   . Diverticulitis   . Seasonal allergies   . Hypothyroidism   . GERD (gastroesophageal reflux disease)     pepto   . Gout   . Diabetes mellitus     Type 2  . Pneumonia   . Anemia    Past Surgical History  Procedure Laterality Date  . Aortobifemoral bypass    . Breast surgery      left - granulomatous mastitis  . Sbo with lysis adhesions    . Eye surgery Bilateral     cataracts  . Colonoscopy    . Av fistula placement Left 12/01/2013    Procedure: ARTERIOVENOUS (AV) FISTULA CREATION- LEFT BRACHIOCEPHALIC;  Surgeon: Angelia Mould, MD;   Location: [place];  Service: Vascular;  Laterality: Left;  . Revison of arteriovenous fistula Left 02/09/2014    Procedure: REVISON OF LEFT ARTERIOVENOUS FISTULA - RESECTION OF RENDUNDANT VEIN;  Surgeon: Angelia Mould, MD;  Location: [place];  Service: Vascular;  Laterality: Left;  . Bascilic vein transposition Right 07/27/2014    Procedure: BASCILIC VEIN TRANSPOSITION;  Surgeon: Angelia Mould, MD;  Location: [place];  Service: Vascular;  Laterality: Right;  . Shuntogram Left 04/19/2014    Procedure: FISTULOGRAM;  Surgeon: Angelia Mould, MD;  Location: [facility] CATH LAB;  Service: Cardiovascular;  Laterality: Left;  . Unilateral upper extremeity angiogram N/A 07/12/2014    Procedure: UNILATERAL UPPER Anselmo Rod;  Surgeon: Angelia Mould, MD;  Location: [facility] CATH LAB;  Service: Cardiovascular;  Laterality: N/A;   Family History  Problem Relation Age of Onset  . Aneurysm Mother   . Heart disease Father   . Stroke Father   . Hypertension Father   . Diabetes Father    History  Substance Use Topics  . Smoking status: Former Smoker    Quit date: 09/24/1994  . Smokeless tobacco: Never Used  . Alcohol Use: Yes     Comment: Quit Oct. 1977 ("somewhat heavy")    Review of Systems  Constitutional: Negative for fever.  HENT: Negative for rhinorrhea and sore throat.   Eyes: Negative for redness.  Respiratory: Positive for cough, shortness of breath and wheezing. Negative for chest tightness.   Cardiovascular: Positive for leg swelling (baseline, unchanged). Negative for chest pain.  Gastrointestinal: Negative for nausea, vomiting, abdominal pain and diarrhea.  Genitourinary: Negative for dysuria.  Musculoskeletal: Negative for myalgias.  Skin: Negative for rash.  Neurological: Negative for headaches.    Allergies  Penicillins  Home Medications   Prior to Admission medications   Medication Sig Start Date End Date Taking? Authorizing Provider  allopurinol  (ZYLOPRIM) 100 MG tablet Take 100 mg by mouth daily.      Historical Provider, MD  aspirin 325 MG tablet Take 650 mg by mouth daily.     Historical Provider, MD  azithromycin (ZITHROMAX) 250 MG tablet 2 po day 1 followed by one po days 2-5 08/06/14   Elby Showers, MD  calcitRIOL (ROCALTROL) 0.25 MCG capsule Take 0.25 mcg by mouth daily.    Historical Provider, MD  clobetasol ointment (TEMOVATE) [id number] % Apply 1 application topically 2 (two) times daily. Sores on head    Historical Provider, MD  cyanocobalamin (,VITAMIN B-12,) 1000 MCG/ML injection Inject 1,000 mcg into the muscle every 30 (thirty) days.    Historical Provider, MD  furosemide (LASIX) 80 MG tablet Take 80 mg by mouth 2 (two) times daily.      Historical Provider, MD  glipiZIDE (GLUCOTROL) 10 MG tablet Take 10 mg by mouth 2 (two) times daily before a meal.      Historical Provider, MD  insulin glargine (LANTUS) 100 UNIT/ML injection Inject 8 Units into the skin at bedtime.     Historical Provider, MD  ipratropium (ATROVENT) 0.03 % nasal spray Place 2 sprays into the nose daily as needed for rhinitis.     Historical Provider, MD  labetalol (NORMODYNE) 200 MG tablet Take 400 mg by mouth 2 (two) times daily.     Historical Provider, MD  levothyroxine (SYNTHROID, LEVOTHROID) 50 MCG tablet Take 1 tablet (50 mcg total) by mouth daily. 08/06/14   Elby Showers, MD  loratadine (CLARITIN) 10 MG tablet Take 10 mg by mouth daily.    Historical Provider, MD  NIFEdipine (PROCARDIA-XL/ADALAT CC) 60 MG 24 hr tablet Take 60 mg by mouth daily.      Historical Provider, MD  oxyCODONE (ROXICODONE) 5 MG immediate release tablet Take 1 tablet (5 mg total) by mouth every 6 (six) hours as needed. 07/27/14   Samantha J Rhyne, PA-C  pioglitazone (ACTOS) 45 MG tablet Take 45 mg by mouth daily.      Historical Provider, MD  RENVELA 800 MG tablet  07/27/14   Historical Provider, MD  simvastatin (ZOCOR) 20 MG tablet Take 10 mg by mouth at bedtime.     Historical  Provider, MD  triamcinolone ointment (KENALOG) 0.1 % Apply 1 application topically 2 (two) times daily as needed. Use between thighs    Historical Provider, MD  zolpidem (AMBIEN) 5 MG tablet  05/25/14   Historical Provider, MD   BP 116/48 mmHg  Pulse 62  Temp(Src) 98.6 F (37 C) (Oral)  Resp 32  Ht 5' 10.5" (1.791 m)  Wt 229 lb 14.4 oz (104.282 kg)  BMI 32.51 kg/m2  SpO2 99%   Physical Exam  Constitutional: He appears well-developed and well-nourished.  HENT:  Head: Normocephalic and atraumatic.  Mouth/Throat: Oropharynx is clear and moist.  Eyes: Conjunctivae are normal. Right eye exhibits no discharge. Left eye exhibits no discharge.  Neck: Normal range of motion. Neck supple.  Cardiovascular: Normal  rate, regular rhythm and normal heart sounds.   No murmur heard. Pulmonary/Chest: Effort normal and breath sounds normal.  Mild tachypnea, no respiratory distress.  Abdominal: Soft. There is no tenderness. There is no rebound and no guarding.  Musculoskeletal: He exhibits edema. He exhibits no tenderness.  1+ pitting edema, symmetric, bilaterally to the mid calves. Failed L venous graft. Newly formed R upper extremity fistula present.   Neurological: He is alert.  Skin: Skin is warm and dry.  Psychiatric: He has a normal mood and affect.  Nursing note and vitals reviewed.   ED Course  Procedures (including critical care time) Labs Review Labs Reviewed  CBC - Abnormal; Notable for the following:    RBC 2.76 (*)    Hemoglobin 9.0 (*)    HCT 27.8 (*)    MCV 100.7 (*)    RDW 17.0 (*)    Platelets 134 (*)    All other components within normal limits  BRAIN NATRIURETIC PEPTIDE - Abnormal; Notable for the following:    B Natriuretic Peptide 862.2 (*)    All other components within normal limits  COMPREHENSIVE METABOLIC PANEL - Abnormal; Notable for the following:    Potassium 6.1 (*)    Glucose, Bld 101 (*)    BUN 56 (*)    Creatinine, Ser 5.76 (*)    Total Protein 5.9 (*)     Albumin 3.1 (*)    Alkaline Phosphatase 32 (*)    GFR calc non Af Amer 9 (*)    GFR calc Af Amer 10 (*)    All other components within normal limits  Randolm Idol, ED    Imaging Review Dg Chest 2 View  10/05/2014   CLINICAL DATA:  Shortness of breath 1 day.  EXAM: CHEST  2 VIEW  COMPARISON:  June 21, 2014.  FINDINGS: The heart size and mediastinal contours are within normal limits. Both lungs are clear. No pneumothorax or pleural effusion is noted. The visualized skeletal structures are unremarkable.  IMPRESSION: No acute cardiopulmonary abnormality seen.   Electronically Signed   By: Sabino Dick M.D.   On: 10/05/2014 15:12     EKG Interpretation   Date/Time:  Tuesday October 05 2014 14:34:29 EST Ventricular Rate:  61 PR Interval:  188 QRS Duration: 88 QT Interval:  436 QTC Calculation: 438 R Axis:   38 Text Interpretation:  Normal sinus rhythm Low voltage QRS Cannot rule out  Anterior infarct , age undetermined Abnormal ECG Confirmed by DOCHERTY   MD, MEGAN [phone]) on 10/05/2014 4:24:09 PM      3:18 PM Patient seen and examined. Work-up initiated. Medications ordered. EKG reviewed by myself.   Vital signs reviewed and are as follows: BP 116/48 mmHg  Pulse 62  Temp(Src) 98.6 F (37 C) (Oral)  Resp 32  Ht 5' 10.5" (1.791 m)  Wt 229 lb 14.4 oz (104.282 kg)  BMI 32.51 kg/m2  SpO2 99%   5:31 PM Patient discussed previously with Dr. Tawnya Crook who has seen.   Spoke with Dr. Tyrell Antonio who will admit.   Spoke with Dr. Jimmy Footman. Plan consult tomorrow. He states that it would be unusual for K to be this high based on his CKD alone. He is not on ACE/ARB. Reccommends 50g kayexalate. They will consult on patient tomorrow.    MDM   Final diagnoses:  Hyperkalemia  Chronic kidney disease (CKD), stage IV (severe)  Shortness of breath   Admit.     Carlisle Cater, PA-C 10/05/14 [place]  Tawnya Crook, MD 10/06/14 1156

## 2014-10-05 NOTE — Progress Notes (Signed)
Hypoglycemic Event  CBG: 20 then 25  Treatment: D50 IV 50 mL  Symptoms: None  Follow-up CBG: Time: 2100   CBG Result: 69  Possible Reasons for Event:  medicatiions  Comments/MD notified: none     [name]  Remember to initiate Hypoglycemia Order Set & complete

## 2014-10-05 NOTE — Progress Notes (Signed)
Hypoglycemic Event  CBG: 69  Treatment: D50 IV 25 mL  Symptoms: None  Follow-up CBG: Time:2135 CBG Result:82  Possible Reasons for Event: Inadequate meal intake  Comments/MD notified:    [name]  Remember to initiate Hypoglycemia Order Set & complete

## 2014-10-05 NOTE — Telephone Encounter (Signed)
Agree he should go to ED

## 2014-10-05 NOTE — H&P (Signed)
[facility] History and Physical  PERLE GIBBON AXK:[id number] DOB: [birth year] DOA: 10/05/2014  Referring physician: ED physician.  PCP: Elby Showers, MD   Chief Complaint: SOB.   HPI: [name] is a 74 y.o. male with PMH significant for CKD stage IV, diabetes, HTN, hypothyroidism who presents complaining of SOB on exertion for last 2 days. This has been associated with productive cough, runny nose. He has had 5 pounds weight gain. Wife has notice that he has swelling of the face and his abdomen is more distended. He denies chest pain, abdominal pain, diarrhea, no fevers, no sick contact.  Patient has been more confuse for last 2 week, wife relate. He has had low blood sugar during the episodes of confusion. His blood sugar last night was 34, 50.   Review of Systems:  Negative, except as per HPI/    Past Medical History  Diagnosis Date  . Hypertension   . Thyroid disease   . Renal insufficiency   . Hyperlipidemia   . ED (erectile dysfunction)   . Allergy   . Elevated homocysteine   . PVD (peripheral vascular disease)     has plastic aorta  . Arthritis   . Diverticulitis   . Seasonal allergies   . Hypothyroidism   . GERD (gastroesophageal reflux disease)     pepto   . Gout   . Diabetes mellitus     Type 2  . Pneumonia   . Anemia    Past Surgical History  Procedure Laterality Date  . Aortobifemoral bypass    . Breast surgery      left - granulomatous mastitis  . Sbo with lysis adhesions    . Eye surgery Bilateral     cataracts  . Colonoscopy    . Av fistula placement Left 12/01/2013    Procedure: ARTERIOVENOUS (AV) FISTULA CREATION- LEFT BRACHIOCEPHALIC;  Surgeon: Angelia Mould, MD;  Location: [place];  Service: Vascular;  Laterality: Left;  . Revison of arteriovenous fistula Left 02/09/2014    Procedure: REVISON OF LEFT ARTERIOVENOUS FISTULA - RESECTION OF RENDUNDANT VEIN;  Surgeon: Angelia Mould, MD;  Location: [place];  Service: Vascular;   Laterality: Left;  . Bascilic vein transposition Right 07/27/2014    Procedure: BASCILIC VEIN TRANSPOSITION;  Surgeon: Angelia Mould, MD;  Location: [place];  Service: Vascular;  Laterality: Right;  . Shuntogram Left 04/19/2014    Procedure: FISTULOGRAM;  Surgeon: Angelia Mould, MD;  Location: [facility] CATH LAB;  Service: Cardiovascular;  Laterality: Left;  . Unilateral upper extremeity angiogram N/A 07/12/2014    Procedure: UNILATERAL UPPER Anselmo Rod;  Surgeon: Angelia Mould, MD;  Location[facility] C[facility] LAB;  Service: Cardiovascular;  Laterality: N/A;   Social History:  reports that he quit smoking about 20 years ago. He has never used smokeless tobacco. He reports that he drinks alcohol. He reports that he does not use illicit drugs.  Allergies  Allergen Reactions  . Penicillins Rash    Family History  Problem Relation Age of Onset  . Aneurysm Mother   . Heart disease Father   . Stroke Father   . Hypertension Father   . Diabetes Father      Prior to Admission medications   Medication Sig Start Date End Date Taking? Authorizing Provider  allopurinol (ZYLOPRIM) 100 MG tablet Take 100 mg by mouth daily.     Yes Historical Provider, MD  aspirin 325 MG tablet Take 325 mg by mouth 2 (two) times daily.  Yes Historical Provider, MD  calcitRIOL (ROCALTROL) 0.25 MCG capsule Take 0.25-0.5 mcg by mouth See admin instructions. Takes 2 capsules on Mon, Wed and Fri Takes 1 capsule all other days   Yes Historical Provider, MD  clobetasol ointment (TEMOVATE) 7.54 % Apply 1 application topically 2 (two) times daily. Sores on head   Yes Historical Provider, MD  cyanocobalamin (,VITAMIN B-12,) 1000 MCG/ML injection Inject 1,000 mcg into the muscle every 30 (thirty) days.   Yes Historical Provider, MD  furosemide (LASIX) 80 MG tablet Take 80 mg by mouth 2 (two) times daily.     Yes Historical Provider, MD  glipiZIDE (GLUCOTROL) 10 MG tablet Take 10 mg by mouth 2 (two) times daily  before a meal.     Yes Historical Provider, MD  insulin glargine (LANTUS) 100 UNIT/ML injection Inject 8 Units into the skin at bedtime.    Yes Historical Provider, MD  ipratropium (ATROVENT) 0.03 % nasal spray Place 2 sprays into the nose daily as needed for rhinitis.    Yes Historical Provider, MD  labetalol (NORMODYNE) 200 MG tablet Take 400 mg by mouth 2 (two) times daily.   Yes Historical Provider, MD  levothyroxine (SYNTHROID, LEVOTHROID) 50 MCG tablet Take 1 tablet (50 mcg total) by mouth daily. 08/06/14  Yes Elby Showers, MD  loratadine (CLARITIN) 10 MG tablet Take 10 mg by mouth daily.   Yes Historical Provider, MD  NIFEdipine (PROCARDIA-XL/ADALAT CC) 60 MG 24 hr tablet Take 60 mg by mouth daily.     Yes Historical Provider, MD  pioglitazone (ACTOS) 45 MG tablet Take 45 mg by mouth daily.     Yes Historical Provider, MD  RENVELA 800 MG tablet Take 800 mg by mouth 3 (three) times daily with meals.  07/27/14  Yes Historical Provider, MD  simvastatin (ZOCOR) 20 MG tablet Take 10 mg by mouth at bedtime.    Yes Historical Provider, MD  triamcinolone ointment (KENALOG) 0.1 % Apply 1 application topically 2 (two) times daily as needed. Use between thighs   Yes Historical Provider, MD  azithromycin (ZITHROMAX) 250 MG tablet 2 po day 1 followed by one po days 2-5 08/06/14   Elby Showers, MD   Physical Exam: Filed Vitals:   10/05/14 1630 10/05/14 1645 10/05/14 1700 10/05/14 1715  BP: 129/75 138/67 133/65 132/95  Pulse: 59 63 58 59  Temp:      TempSrc:      Resp: 21 23 15 14   Height:      Weight:      SpO2: 100% 100% 100% 100%    Wt Readings from Last 3 Encounters:  10/05/14 104.282 kg (229 lb 14.4 oz)  09/08/14 106.369 kg (234 lb 8 oz)  08/06/14 104.781 kg (231 lb)    General:  Appears calm and comfortable Eyes: PERRL, normal lids, irises & conjunctiva ENT: grossly normal hearing, lips & tongue Neck: no LAD, masses or thyromegaly Cardiovascular: RRR, no m/r/g. Bilateral  LE edema.   Respiratory:  Normal respiratory effort. Bilateral crackles and wheezing.  Abdomen: soft, nt, distended, old incision scar mid abdomen.  Skin: no rash or induration seen on limited exam Musculoskeletal: grossly normal tone BUE/BLE Psychiatric: grossly normal mood and affect, speech fluent and appropriate Neurologic: grossly non-focal.          Labs on Admission:  Basic Metabolic Panel:  Recent Labs Lab 10/05/14 1441  NA 140  K 6.1*  CL 106  CO2 22  GLUCOSE 101*  BUN 56*  CREATININE  5.76*  CALCIUM 9.7   Liver Function Tests:  Recent Labs Lab 10/05/14 1441  AST 21  ALT 10  ALKPHOS 32*  BILITOT 0.6  PROT 5.9*  ALBUMIN 3.1*   No results for input(s): LIPASE, AMYLASE in the last 168 hours. No results for input(s): AMMONIA in the last 168 hours. CBC:  Recent Labs Lab 10/05/14 1441  WBC 5.7  HGB 9.0*  HCT 27.8*  MCV 100.7*  PLT 134*   Cardiac Enzymes: No results for input(s): CKTOTAL, CKMB, CKMBINDEX, TROPONINI in the last 168 hours.  BNP (last 3 results) No results for input(s): PROBNP in the last 8760 hours. CBG: No results for input(s): GLUCAP in the last 168 hours.  Radiological Exams on Admission: Dg Chest 2 View  10/05/2014   CLINICAL DATA:  Shortness of breath 1 day.  EXAM: CHEST  2 VIEW  COMPARISON:  June 21, 2014.  FINDINGS: The heart size and mediastinal contours are within normal limits. Both lungs are clear. No pneumothorax or pleural effusion is noted. The visualized skeletal structures are unremarkable.  IMPRESSION: No acute cardiopulmonary abnormality seen.   Electronically Signed   By: Sabino Dick M.D.   On: 10/05/2014 15:12    EKG: Independently reviewed. Sinus rhythm.,   Assessment/Plan Principal Problem:   Hyperkalemia Active Problems:   Diabetes mellitus type 2 with complications   Essential hypertension   Hypothyroidism   Chronic kidney disease (CKD), stage IV (severe)  1-Hyperkalemia; In setting of renal failure. No EKG  changes.  Will give insulin, Amp D 50, kayexalate, lasix.  Repeat B-met tonight.   2-Dyspnea: in setting volume overload in setting renal failure and or Bronchitis. . IV lasix, cycle enzymes. Nebulizer treatments.   3-CKD stage IV;  Patient with dyspnea, LE edema, weight gain.  Continue with IV lasix 80 mg IV BID.  Nephrologist consulted.  Monitor urine out put.  Needs diet education.   4-HTN; SBP in the 120 range. Hold labetalol, nifedipine.   5-Bradycardia; will correct hyperkalemia. Will hold labetalol for now.   6-Diabetes; Episodes of hypoglycemia at home. Hold oral hypoglycemic medication, and insulin. CBG every 4 hours.    Code Status: full code.  DVT Prophylaxis: heparin Family Communication: care discussed with patient and wife.   Disposition Plan: expect 3 to 4 days inpatient.   Time spent: 75 minutes.   Niel Hummer A [facility] Pager [phone]

## 2014-10-05 NOTE — Telephone Encounter (Signed)
Patient's wife called this morning stating patient was very ill.  States he is SOB and feels maybe he is having "heart symptoms".  States his face is very swollen and he has "cold symptoms" as well.  I advised his wife because he is SOB and due to his health history, patient needs to be taken to [facility] ED to be evaluated.  Advised I would make Dr. Renold Genta aware of the call.  Dr. Renold Genta was not yet in the office.  And, we didn't have an appointment time for the patient to be evaluated.  Furthermore, with patient having SOB and swelling of the face, he needed to be evaluated due to his history of kidney failure and heart issues in the ED.    Wife verbalized understanding of these instructions.  Passing along to Dr. Renold Genta as well for her review.

## 2014-10-05 NOTE — Consult Note (Signed)
Reason for Consult:CKD5 Referring Physician: Dr. Dirk Dress is an 74 y.o. male.  HPI: 74 yr old male with hx PVD, s/p ABFG, DM, DJD, Gout, GERD, Divertic dz with hx abscess, Hypothyroidism, HTN, Anemia, HTPH, admitted now with ^cough, sputum production, and more SOB.  We have known him since early 53s when had ATN after ABFG. Subsequently developed DM, proteinuria and progressive ^ Cr on top of residual disease.  Baseline Cr now 5.3 on 09/08/14.  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  Now has RUA BVT placed on 11/3.  [place] 10/15 with pneu and has never regained full functional status post that.  No N, V, but anorexia.  Chronic scalp itching.  Has LE edema at his chronic level.  No PND or orthop, or CP but main c/o is coughing and more SOB with that. Constitutional: as above, does not feel real bad Eyes: negative except for known DM retinopathy Ears, nose, mouth, throat, and face: ST Respiratory: as above, better after breathing tx Cardiovascular: as above Gastrointestinal: negative Genitourinary:nocturia x 4-5 Integument/breast: scalp chronic itching Hematologic/lymphatic: anemia of CKD Musculoskeletal:hx arthritis of knees and gout in past Neurological: negative Endocrine: low BS this am Allergic/Immunologic: Pen   Primary Nephrologist [place]. . . Access RUA AVF.  Past Medical History  Diagnosis Date  . Hypertension   . Thyroid disease   . Renal insufficiency   . Hyperlipidemia   . ED (erectile dysfunction)   . Allergy   . Elevated homocysteine   . PVD (peripheral vascular disease)     has plastic aorta  . Arthritis   . Diverticulitis   . Seasonal allergies   . Hypothyroidism   . GERD (gastroesophageal reflux disease)     pepto   . Gout   . Diabetes mellitus     Type 2  . Pneumonia   . Anemia     Past Surgical History  Procedure Laterality Date  . Aortobifemoral bypass    . Breast surgery      left - granulomatous mastitis  . Sbo with lysis  adhesions    . Eye surgery Bilateral     cataracts  . Colonoscopy    . Av fistula placement Left 12/01/2013    Procedure: ARTERIOVENOUS (AV) FISTULA CREATION- LEFT BRACHIOCEPHALIC;  Surgeon: Angelia Mould, MD;  Location: [place];  Service: Vascular;  Laterality: Left;  . Revison of arteriovenous fistula Left 02/09/2014    Procedure: REVISON OF LEFT ARTERIOVENOUS FISTULA - RESECTION OF RENDUNDANT VEIN;  Surgeon: Angelia Mould, MD;  Location: [place];  Service: Vascular;  Laterality: Left;  . Bascilic vein transposition Right 07/27/2014    Procedure: BASCILIC VEIN TRANSPOSITION;  Surgeon: Angelia Mould, MD;  Location: [place];  Service: Vascular;  Laterality: Right;  . Shuntogram Left 04/19/2014    Procedure: FISTULOGRAM;  Surgeon: Angelia Mould, MD;  Location: [facility] CATH LAB;  Service: Cardiovascular;  Laterality: Left;  . Unilateral upper extremeity angiogram N/A 07/12/2014    Procedure: UNILATERAL UPPER Anselmo Rod;  Surgeon: Angelia Mould, MD;  Location: [facility] CATH LAB;  Service: Cardiovascular;  Laterality: N/A;    Family History  Problem Relation Age of Onset  . Aneurysm Mother   . Heart disease Father   . Stroke Father   . Hypertension Father   . Diabetes Father     Social History:  reports that he quit smoking about 20 years ago. He has never used smokeless tobacco. He reports that he drinks  alcohol. He reports that he does not use illicit drugs.  Allergies:  Allergies  Allergen Reactions  . Penicillins Rash    Medications:  I have reviewed the patient's current medications. Prior to Admission:  Prescriptions prior to admission  Medication Sig Dispense Refill Last Dose  . allopurinol (ZYLOPRIM) 100 MG tablet Take 100 mg by mouth daily.     10/05/2014 at Unknown time  . aspirin 325 MG tablet Take 325 mg by mouth 2 (two) times daily.    10/05/2014 at Unknown time  . calcitRIOL (ROCALTROL) 0.25 MCG capsule Take 0.25-0.5 mcg by mouth See admin  instructions. Takes 2 capsules on Mon, Wed and Fri Takes 1 capsule all other days   10/05/2014 at Unknown time  . clobetasol ointment (TEMOVATE) 6.76 % Apply 1 application topically 2 (two) times daily. Sores on head   10/05/2014 at Unknown time  . cyanocobalamin (,VITAMIN B-12,) 1000 MCG/ML injection Inject 1,000 mcg into the muscle every 30 (thirty) days.   Past Week at Unknown time  . furosemide (LASIX) 80 MG tablet Take 80 mg by mouth 2 (two) times daily.     10/05/2014 at Unknown time  . glipiZIDE (GLUCOTROL) 10 MG tablet Take 10 mg by mouth 2 (two) times daily before a meal.     10/05/2014 at Unknown time  . insulin glargine (LANTUS) 100 UNIT/ML injection Inject 8 Units into the skin at bedtime.    10/04/2014 at Unknown time  . ipratropium (ATROVENT) 0.03 % nasal spray Place 2 sprays into the nose daily as needed for rhinitis.    10/05/2014 at Unknown time  . labetalol (NORMODYNE) 200 MG tablet Take 400 mg by mouth 2 (two) times daily.   10/05/2014 at 1000  . levothyroxine (SYNTHROID, LEVOTHROID) 50 MCG tablet Take 1 tablet (50 mcg total) by mouth daily. 90 tablet 1 10/05/2014 at Unknown time  . loratadine (CLARITIN) 10 MG tablet Take 10 mg by mouth daily.   10/05/2014 at Unknown time  . NIFEdipine (PROCARDIA-XL/ADALAT CC) 60 MG 24 hr tablet Take 60 mg by mouth daily.     10/05/2014 at Unknown time  . pioglitazone (ACTOS) 45 MG tablet Take 45 mg by mouth daily.     10/05/2014 at Unknown time  . RENVELA 800 MG tablet Take 800 mg by mouth 3 (three) times daily with meals.   0 10/05/2014 at Unknown time  . simvastatin (ZOCOR) 20 MG tablet Take 10 mg by mouth at bedtime.    10/04/2014 at Unknown time  . triamcinolone ointment (KENALOG) 0.1 % Apply 1 application topically 2 (two) times daily as needed. Use between thighs   10/05/2014 at Unknown time  . azithromycin (ZITHROMAX) 250 MG tablet 2 po day 1 followed by one po days 2-5 6 tablet 0 Taking    Calcitriol .25.   Results for orders placed or performed  during the hospital encounter of 10/05/14 (from the past 48 hour(s))  CBC     Status: Abnormal   Collection Time: 10/05/14  2:41 PM  Result Value Ref Range   WBC 5.7 4.0 - 10.5 K/uL   RBC 2.76 (L) 4.22 - 5.81 MIL/uL   Hemoglobin 9.0 (L) 13.0 - 17.0 g/dL   HCT 27.8 (L) 39.0 - 52.0 %   MCV 100.7 (H) 78.0 - 100.0 fL   MCH 32.6 26.0 - 34.0 pg   MCHC 32.4 30.0 - 36.0 g/dL   RDW 17.0 (H) 11.5 - 15.5 %   Platelets 134 (L) 150 - 400  K/uL  BNP (order ONLY if patient complains of dyspnea/SOB AND you have documented it for THIS visit)     Status: Abnormal   Collection Time: 10/05/14  2:41 PM  Result Value Ref Range   B Natriuretic Peptide 862.2 (H) 0.0 - 100.0 pg/mL    Comment: Please note change in reference range.  Comprehensive metabolic panel     Status: Abnormal   Collection Time: 10/05/14  2:41 PM  Result Value Ref Range   Sodium 140 135 - 145 mmol/L    Comment: Please note change in reference range.   Potassium 6.1 (HH) 3.5 - 5.1 mmol/L    Comment: Please note change in reference range. REPEATED TO VERIFY NO VISIBLE HEMOLYSIS CRITICAL RESULT CALLED TO, READ BACK BY AND VERIFIED WITH: L BISHOP,RN 1.12.16 1550 M SHIPMAN    Chloride 106 96 - 112 mEq/L   CO2 22 19 - 32 mmol/L   Glucose, Bld 101 (H) 70 - 99 mg/dL   BUN 56 (H) 6 - 23 mg/dL   Creatinine, Ser 5.76 (H) 0.50 - 1.35 mg/dL   Calcium 9.7 8.4 - 10.5 mg/dL   Total Protein 5.9 (L) 6.0 - 8.3 g/dL   Albumin 3.1 (L) 3.5 - 5.2 g/dL   AST 21 0 - 37 U/L   ALT 10 0 - 53 U/L   Alkaline Phosphatase 32 (L) 39 - 117 U/L   Total Bilirubin 0.6 0.3 - 1.2 mg/dL   GFR calc non Af Amer 9 (L) >90 mL/min   GFR calc Af Amer 10 (L) >90 mL/min    Comment: (NOTE) The eGFR has been calculated using the CKD EPI equation. This calculation has not been validated in all clinical situations. eGFR's persistently <90 mL/min signify possible Chronic Kidney Disease.    Anion gap 12 5 - 15  I-stat troponin, ED (not at [facility])     Status: None    Collection Time: 10/05/14  2:57 PM  Result Value Ref Range   Troponin i, poc 0.02 0.00 - 0.08 ng/mL   Comment 3            Comment: Due to the release kinetics of cTnI, a negative result within the first hours of the onset of symptoms does not rule out myocardial infarction with certainty. If myocardial infarction is still suspected, repeat the test at appropriate intervals.   CBG monitoring, ED     Status: None   Collection Time: 10/05/14  6:14 PM  Result Value Ref Range   Glucose-Capillary 81 70 - 99 mg/dL  CBG monitoring, ED     Status: Abnormal   Collection Time: 10/05/14  6:41 PM  Result Value Ref Range   Glucose-Capillary 133 (H) 70 - 99 mg/dL    Dg Chest 2 View  10/05/2014   CLINICAL DATA:  Shortness of breath 1 day.  EXAM: CHEST  2 VIEW  COMPARISON:  June 21, 2014.  FINDINGS: The heart size and mediastinal contours are within normal limits. Both lungs are clear. No pneumothorax or pleural effusion is noted. The visualized skeletal structures are unremarkable.  IMPRESSION: No acute cardiopulmonary abnormality seen.   Electronically Signed   By: Sabino Dick M.D.   On: 10/05/2014 15:12    ROS Blood pressure 116/59, pulse 58, temperature 98.6 F (37 C), temperature source Oral, resp. rate 19, height 5' 10.5" (1.791 m), weight 104.282 kg (229 lb 14.4 oz), SpO2 100 %. Physical Exam Physical Examination: General appearance - oriented to person, place, and time and  NAD congested voice Mental status - alert, oriented to person, place, and time, normal mood, behavior, speech, dress, motor activity, and thought processes Eyes - pupils equal and reactive, extraocular eye movements intact, funduscopic exam abnormal DM retinopathy Mouth - upper and lower plates Neck - adenopathy noted PCL Lymphatics - posterior cervical nodes Chest - wheezing noted bilat, decreased air entry noted bilat, rhonchi noted bilat Heart - S4 , Gr 2/6 holosys M,  PMI 13 cm lat to MSL Abdomen - obese, pos  bs, soft, liver down 6 cm.  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  Not sure fistula is going to make it.  K^ from RTA, diet and low GFR.  Cr at baseline.  Need to tx K, give po bicarb and po Lasix 2 Bronchitis primary issue , on bronchodilators 3 Hypertension: controlled, but diurese 4. Anemia of ESRD: address with iv Fe, and EPO 5. Metabolic Bone Disease: check PTH 6 Obesity 7 DM needs close monitoring and avoid furthur low glu 8 PVD 9 Divertic dz 10 BPH P Po lasix, Na bicarb, VVS to see about fistula, bronchodilators, EPO, IV Fe, check PTH  Stevana Dufner L 10/05/2014, 7:38 PM

## 2014-10-06 DIAGNOSIS — E039 Hypothyroidism, unspecified: Secondary | ICD-10-CM

## 2014-10-06 LAB — GLUCOSE, CAPILLARY
Glucose-Capillary: 102 mg/dL — ABNORMAL HIGH (ref 70–99)
Glucose-Capillary: 147 mg/dL — ABNORMAL HIGH (ref 70–99)
Glucose-Capillary: 157 mg/dL — ABNORMAL HIGH (ref 70–99)
Glucose-Capillary: 157 mg/dL — ABNORMAL HIGH (ref 70–99)
Glucose-Capillary: 20 mg/dL — CL (ref 70–99)
Glucose-Capillary: 22 mg/dL — CL (ref 70–99)
Glucose-Capillary: 227 mg/dL — ABNORMAL HIGH (ref 70–99)
Glucose-Capillary: 234 mg/dL — ABNORMAL HIGH (ref 70–99)
Glucose-Capillary: 237 mg/dL — ABNORMAL HIGH (ref 70–99)
Glucose-Capillary: 60 mg/dL — ABNORMAL LOW (ref 70–99)
Glucose-Capillary: 72 mg/dL (ref 70–99)
Glucose-Capillary: 84 mg/dL (ref 70–99)
Glucose-Capillary: 93 mg/dL (ref 70–99)

## 2014-10-06 LAB — RENAL FUNCTION PANEL
Albumin: 3 g/dL — ABNORMAL LOW (ref 3.5–5.2)
Anion gap: 12 (ref 5–15)
BUN: 53 mg/dL — ABNORMAL HIGH (ref 6–23)
CO2: 21 mmol/L (ref 19–32)
Calcium: 9.6 mg/dL (ref 8.4–10.5)
Chloride: 103 mEq/L (ref 96–112)
Creatinine, Ser: 5.63 mg/dL — ABNORMAL HIGH (ref 0.50–1.35)
GFR calc Af Amer: 10 mL/min — ABNORMAL LOW (ref >90)
GFR calc non Af Amer: 9 mL/min — ABNORMAL LOW (ref >90)
Glucose, Bld: 71 mg/dL (ref 70–99)
Phosphorus: 6.1 mg/dL — ABNORMAL HIGH (ref 2.3–4.6)
Potassium: 4 mmol/L (ref 3.5–5.1)
Sodium: 136 mmol/L (ref 135–145)

## 2014-10-06 LAB — CBC
HCT: 27.2 % — ABNORMAL LOW (ref 39.0–52.0)
Hemoglobin: 8.9 g/dL — ABNORMAL LOW (ref 13.0–17.0)
MCH: 33 pg (ref 26.0–34.0)
MCHC: 32.7 g/dL (ref 30.0–36.0)
MCV: 100.7 fL — ABNORMAL HIGH (ref 78.0–100.0)
Platelets: 125 10*3/uL — ABNORMAL LOW (ref 150–400)
RBC: 2.7 MIL/uL — ABNORMAL LOW (ref 4.22–5.81)
RDW: 17.2 % — ABNORMAL HIGH (ref 11.5–15.5)
WBC: 5 10*3/uL (ref 4.0–10.5)

## 2014-10-06 LAB — IRON AND TIBC
Iron: 331 ug/dL — ABNORMAL HIGH (ref 42–165)
Saturation Ratios: 71 % — ABNORMAL HIGH (ref 20–55)
TIBC: 469 ug/dL — ABNORMAL HIGH (ref 215–435)
UIBC: 138 ug/dL (ref 125–400)

## 2014-10-06 MED ORDER — IPRATROPIUM-ALBUTEROL 0.5-2.5 (3) MG/3ML IN SOLN
3.0000 mL | Freq: Two times a day (BID) | RESPIRATORY_TRACT | Status: DC
  Administered 2014-10-06 – 2014-10-07 (×4): 3 mL via RESPIRATORY_TRACT
  Filled 2014-10-06 (×5): qty 3

## 2014-10-06 MED ORDER — NIFEDIPINE ER 60 MG PO TB24
60.0000 mg | ORAL_TABLET | Freq: Every day | ORAL | Status: DC
  Administered 2014-10-06 – 2014-10-08 (×3): 60 mg via ORAL
  Filled 2014-10-06 (×3): qty 1

## 2014-10-06 MED ORDER — LABETALOL HCL 200 MG PO TABS
400.0000 mg | ORAL_TABLET | Freq: Two times a day (BID) | ORAL | Status: DC
  Administered 2014-10-06 – 2014-10-08 (×5): 400 mg via ORAL
  Filled 2014-10-06 (×7): qty 2

## 2014-10-06 NOTE — Progress Notes (Signed)
[facility]                                                                              Patient Demographics  [name], is a 74 y.o. male, DOB - 08-27-1941, [id number]  Admit date - 10/05/2014   Admitting Physician Elmarie Shiley, MD  Outpatient Primary MD for the patient is Elby Showers, MD  LOS - 1   Chief Complaint  Patient presents with  . Shortness of Breath  . Cough      HPI on 10/05/2014 by Dr. Niel Hummer [name] is a 74 y.o. male with PMH significant for CKD stage IV, diabetes, HTN, hypothyroidism who presents complaining of SOB on exertion for last 2 days. This has been associated with productive cough, runny nose. He has had 5 pounds weight gain. Wife has notice that he has swelling of the face and his abdomen is more distended. He denies chest pain, abdominal pain, diarrhea, no fevers, no sick contact.  Patient has been more confuse for last 2 week, wife relate. He has had low blood sugar during the episodes of confusion. His blood sugar last night was 34, 50.   Assessment & Plan   Hyperkalemia -In setting of renal failure. No EKG changes.  -Was given insulin, Amp D 50, kayexalate, lasix.  -Resolved  Dyspnea/Bronchitis -in setting volume overload in setting renal failure and or Bronchitis.  -Improved -Continue lasix PO 160mg  BID, nebs  CKD stage IV;  -Patient with dyspnea, LE edema, weight gain.  -Continue lasix PO -Nephrologist consulted and appreciated -Continue to monitor intake and output  Hypertension -stable, hold labetalol, nifedipine.   Bradycardia -Appears to have resolved, labetalol held  Diabetes Mellitus, type 2 -Episodes of hypoglycemia at home. Hold oral hypoglycemic medication, and insulin. CBG every 4 hours.  Code Status: Full  Family Communication: None at bedside  Disposition Plan: Admitted  Time Spent in minutes   30 minutes  Procedures  None  Consults   Nephrology  DVT Prophylaxis   Heparin  Lab Results  Component Value Date   PLT 125* 10/06/2014    Medications  Scheduled Meds: . allopurinol  100 mg Oral Daily  . aspirin  325 mg Oral BID  . [START ON 10/07/2014] calcitRIOL  0.25 mcg Oral Once per day on Sun Tue Thu Sat   And  . calcitRIOL  0.5 mcg Oral Once per day on Mon Wed Fri  . [START ON 10/12/2014] darbepoetin (ARANESP) injection - NON-DIALYSIS  150 mcg Subcutaneous Q Tue-1800  . docusate sodium  100 mg Oral BID  . furosemide  160 mg Oral BID  . heparin  5,000 Units Subcutaneous 3 times per day  . ipratropium-albuterol  3 mL Nebulization BID  . levothyroxine  50 mcg Oral QAC breakfast  . sevelamer carbonate  800 mg Oral TID WC  . simvastatin  10 mg Oral QHS  . sodium bicarbonate  1,300 mg Oral BID  . sodium chloride  3 mL Intravenous Q12H  . sodium chloride  3 mL Intravenous Q12H   Continuous Infusions:  PRN Meds:.sodium chloride, acetaminophen **OR** acetaminophen, albuterol, HYDROcodone-acetaminophen, sodium chloride  Antibiotics    Anti-infectives  None      Subjective:   [name] seen and examined today.  Patient continues to complain of SOB, but states it has improved.  He denies chest pain, dizziness, abdominal pain.  Objective:   Filed Vitals:   10/06/14 0121 10/06/14 [id number] 10/06/14 0753 10/06/14 0907  BP:  147/72  142/49  Pulse: 67 72  71  Temp:  98.4 F (36.9 C)  98.2 F (36.8 C)  TempSrc:  Oral  Oral  Resp: 18 18  18   Height:      Weight:      SpO2: 99% 98% 98% 98%    Wt Readings from Last 3 Encounters:  10/05/14 103.3 kg (227 lb 11.8 oz)  09/08/14 106.369 kg (234 lb 8 oz)  08/06/14 104.781 kg (231 lb)     Intake/Output Summary (Last 24 hours) at 10/06/14 1342 Last data filed at 10/06/14 1212  Gross per 24 hour  Intake    668 ml  Output   1950 ml  Net  -1282 ml    Exam  General: Well developed, well nourished, NAD, appears stated age  HEENT: NCAT, mucous membranes moist.   Cardiovascular: S1 S2  auscultated, 2/6 SEM, Regular rate and rhythm.  Respiratory: Clear to auscultation bilaterally with equal chest rise  Abdomen: Soft, obese, nontender, nondistended, + bowel sounds  Extremities: warm dry without cyanosis clubbing or edema  Neuro: AAOx3, nonfocal  Skin: Without rashes exudates or nodules  Psych: Normal affect and demeanor with intact judgement and insight  Data Review   Micro Results No results found for this or any previous visit (from the past 240 hour(s)).  Radiology Reports Dg Chest 2 View  10/05/2014   CLINICAL DATA:  Shortness of breath 1 day.  EXAM: CHEST  2 VIEW  COMPARISON:  June 21, 2014.  FINDINGS: The heart size and mediastinal contours are within normal limits. Both lungs are clear. No pneumothorax or pleural effusion is noted. The visualized skeletal structures are unremarkable.  IMPRESSION: No acute cardiopulmonary abnormality seen.   Electronically Signed   By: Sabino Dick M.D.   On: 10/05/2014 15:12    CBC  Recent Labs Lab 10/05/14 1441 10/06/14 0610  WBC 5.7 5.0  HGB 9.0* 8.9*  HCT 27.8* 27.2*  PLT 134* 125*  MCV 100.7* 100.7*  MCH 32.6 33.0  MCHC 32.4 32.7  RDW 17.0* 17.2*    Chemistries   Recent Labs Lab 10/05/14 1441 10/05/14 1938 10/06/14 0610  NA 140 140 136  K 6.1* 5.0 4.0  CL 106 107 103  CO2 22 24 21   GLUCOSE 101* 58* 71  BUN 56* 56* 53*  CREATININE 5.76* 5.59* 5.63*  CALCIUM 9.7 10.0 9.6  AST 21  --   --   ALT 10  --   --   ALKPHOS 32*  --   --   BILITOT 0.6  --   --    ------------------------------------------------------------------------------------------------------------------ estimated creatinine clearance is 14.2 mL/min (by C-G formula based on Cr of 5.63). ------------------------------------------------------------------------------------------------------------------ No results for input(s): HGBA1C in the last 72  hours. ------------------------------------------------------------------------------------------------------------------ No results for input(s): CHOL, HDL, LDLCALC, TRIG, CHOLHDL, LDLDIRECT in the last 72 hours. ------------------------------------------------------------------------------------------------------------------ No results for input(s): TSH, T4TOTAL, T3FREE, THYROIDAB in the last 72 hours.  Invalid input(s): FREET3 ------------------------------------------------------------------------------------------------------------------ No results for input(s): VITAMINB12, FOLATE, FERRITIN, TIBC, IRON, RETICCTPCT in the last 72 hours.  Coagulation profile No results for input(s): INR, PROTIME in the last 168 hours.  No results for input(s): DDIMER in the  last 72 hours.  Cardiac Enzymes No results for input(s): CKMB, TROPONINI, MYOGLOBIN in the last 168 hours.  Invalid input(s): CK ------------------------------------------------------------------------------------------------------------------ Invalid input(s): POCBNP    Calem Cocozza D.O. on 10/06/2014 at 1:42 PM  Between 7am to 7pm - Pager - [phone]  After 7pm go to [email or web address] - password [email or web address]  And look for the night coverage person covering for me after hours  [facility] Office  [phone]

## 2014-10-06 NOTE — Progress Notes (Signed)
Inpatient Diabetes Program Recommendations  AACE/ADA: New Consensus Statement on Inpatient Glycemic Control (2013)  Target Ranges:  Prepandial:   less than 140 mg/dL      Peak postprandial:   less than 180 mg/dL (1-2 hours)      Critically ill patients:  140 - 180 mg/dL   Results for [name], [name] (MRN [id number]) as of 10/06/2014 11:35  Ref. Range 10/05/2014 18:14 10/05/2014 18:41 10/05/2014 20:26 10/05/2014 20:28 10/05/2014 21:02 10/05/2014 21:31 10/06/2014 00:07 10/06/2014 00:51 10/06/2014 02:57 10/06/2014 06:09 10/06/2014 07:33 10/06/2014 08:01 10/06/2014 09:10 10/06/2014 11:26  Glucose-Capillary Latest Range: 70-99 mg/dL 81 133 (H) 20 (LL) 25 (LL) 69 (L) 82 22 (LL) 84 102 (H) 72 60 (L) 93 147 (H) 157 (H)   Diabetes history: DM2 Outpatient Diabetes medications: Lantus 8 units QHS, Glipizide 10 mg BID Current orders for Inpatient glycemic control: CBGs Q4H  Inpatient Diabetes Program Recommendations Correction (SSI): Noted CBGs beginning to stabilize. Please consider ordering CBGs with Novolog sensitive correction ACHS.  Note: Initial CBG 81 mg/dl and H&P reports episodes of hypoglycemia at home. Patient was treated with Novolog 10 units along with D50 at 18:17 for elevated potassium. Anticipate treatment with Novolog 10 units contributed to noted hypoglycemia following treatment. CBG up to 157 mg/dl at 11:26 am today. Please consider ordering CBGs with Novolog sensitive correction scale ACHS.   Thanks, Barnie Alderman, RN, MSN, CCRN, CDE Diabetes Coordinator Inpatient Diabetes Program [phone] (Team Pager) [phone] ([facility] office) [phone] [facility] office)

## 2014-10-06 NOTE — Progress Notes (Signed)
[name] ROUNDING NOTE   Subjective:   Interval History: responding to diuretics   Objective:  Vital signs in last 24 hours:  Temp:  [98 F (36.7 C)-98.6 F (37 C)] 98.2 F (36.8 C) (01/13 0907) Pulse Rate:  [57-88] 71 (01/13 0907) Resp:  [14-32] 18 (01/13 0907) BP: (102-147)/(48-101) 142/49 mmHg (01/13 0907) SpO2:  [98 %-100 %] 98 % (01/13 0907) Weight:  [103.3 kg (227 lb 11.8 oz)-104.282 kg (229 lb 14.4 oz)] 103.3 kg (227 lb 11.8 oz) (01/12 2100)  Weight change:  Filed Weights   10/05/14 1449 10/05/14 2100  Weight: 104.282 kg (229 lb 14.4 oz) 103.3 kg (227 lb 11.8 oz)    Intake/Output: I/O last 3 completed shifts: In: 71 [P.O.:240; I.V.:68] Out: 1150 [Urine:1150]   Intake/Output this shift:  Total I/O In: 360 [P.O.:360] Out: 800 [Urine:800]  Mental status - alert, oriented  Chest - decreased air entry noted bilat, rhonchi noted bilat Heart - RRR 2/6 Abdomen - obese Extremities - AVF RUA B&T, marginal, Tr DP , bilat Fem bruits  Basic Metabolic Panel:  Recent Labs Lab 10/05/14 1441 10/05/14 1938 10/06/14 0610  NA 140 140 136  K 6.1* 5.0 4.0  CL 106 107 103  CO2 22 24 21   GLUCOSE 101* 58* 71  BUN 56* 56* 53*  CREATININE 5.76* 5.59* 5.63*  CALCIUM 9.7 10.0 9.6  PHOS  --   --  6.1*    Liver Function Tests:  Recent Labs Lab 10/05/14 1441 10/06/14 0610  AST 21  --   ALT 10  --   ALKPHOS 32*  --   BILITOT 0.6  --   PROT 5.9*  --   ALBUMIN 3.1* 3.0*   No results for input(s): LIPASE, AMYLASE in the last 168 hours. No results for input(s): AMMONIA in the last 168 hours.  CBC:  Recent Labs Lab 10/05/14 1441 10/06/14 0610  WBC 5.7 5.0  HGB 9.0* 8.9*  HCT 27.8* 27.2*  MCV 100.7* 100.7*  PLT 134* 125*    Cardiac Enzymes: No results for input(s): CKTOTAL, CKMB, CKMBINDEX, TROPONINI in the last 168 hours.  BNP: Invalid input(s): POCBNP  CBG:  Recent Labs Lab 10/06/14 0733 10/06/14 0801 10/06/14 0910  10/06/14 1126 10/06/14 1317  GLUCAP 60* 93 147* 157* 157*    Microbiology: No results found for this or any previous visit.  Coagulation Studies: No results for input(s): LABPROT, INR in the last 72 hours.  Urinalysis:  Recent Labs  10/05/14 2037  COLORURINE YELLOW  LABSPEC 1.009  PHURINE 6.5  GLUCOSEU NEGATIVE  HGBUR TRACE*  BILIRUBINUR NEGATIVE  KETONESUR NEGATIVE  PROTEINUR 100*  UROBILINOGEN 0.2  NITRITE NEGATIVE  LEUKOCYTESUR NEGATIVE      Imaging: Dg Chest 2 View  10/05/2014   CLINICAL DATA:  Shortness of breath 1 day.  EXAM: CHEST  2 VIEW  COMPARISON:  June 21, 2014.  FINDINGS: The heart size and mediastinal contours are within normal limits. Both lungs are clear. No pneumothorax or pleural effusion is noted. The visualized skeletal structures are unremarkable.  IMPRESSION: No acute cardiopulmonary abnormality seen.   Electronically Signed   By: Sabino Dick M.D.   On: 10/05/2014 15:12     Medications:     . allopurinol  100 mg Oral Daily  . aspirin  325 mg Oral BID  . [START ON 10/07/2014] calcitRIOL  0.25 mcg Oral Once per day on Sun Tue Thu Sat   And  . calcitRIOL  0.5 mcg Oral Once  per day on Mon Wed Fri  . [START ON 10/12/2014] darbepoetin (ARANESP) injection - NON-DIALYSIS  150 mcg Subcutaneous Q Tue-1800  . docusate sodium  100 mg Oral BID  . furosemide  160 mg Oral BID  . heparin  5,000 Units Subcutaneous 3 times per day  . ipratropium-albuterol  3 mL Nebulization BID  . levothyroxine  50 mcg Oral QAC breakfast  . sevelamer carbonate  800 mg Oral TID WC  . simvastatin  10 mg Oral QHS  . sodium bicarbonate  1,300 mg Oral BID  . sodium chloride  3 mL Intravenous Q12H  . 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

## 2014-10-06 NOTE — Progress Notes (Signed)
Utilization review completed. Stacey Maura, RN, BSN. 

## 2014-10-06 NOTE — Plan of Care (Signed)
Problem: Food- and Nutrition-Related Knowledge Deficit (NB-1.1) Goal: Nutrition education Formal process to instruct or train a patient/client in a skill or to impart knowledge to help patients/clients voluntarily manage or modify food choices and eating behavior to maintain or improve health. Outcome: Completed/Met Date Met:  10/06/14 Nutrition Education Note  RD consulted for Renal Education. Provided "Healthy Eating with Kidney Disease" to patient. Reviewed food groups and provided written recommended serving sizes specifically determined for patient's current nutritional status.   Explained why diet restrictions are needed and provided lists of foods to limit/avoid that are high potassium, sodium, and phosphorus. Provided specific recommendations on safer alternatives of these foods. Strongly encouraged compliance of this diet.   Discussed importance of protein intake at each meal and snack. Provided examples of how to maximize protein intake throughout the day. Discussed renal-friendly beverage options.Teach back method used.  Expect good compliance.  Body mass index is 32.2 kg/(m^2). Pt meets criteria for class I obesity based on current BMI.  Current diet order is renal, patient is consuming approximately 75% of meals at this time. Labs and medications reviewed. No further nutrition interventions warranted at this time. RD contact information provided. If additional nutrition issues arise, please re-consult RD.  Kallie Locks, MS, RD, LDN Pager # [phone] After hours/ weekend pager # [phone]

## 2014-10-06 NOTE — Progress Notes (Signed)
Hypoglycemic Event  CBG: Results for EDWARDO, TULEY (MRN [id number]) as of 10/06/2014 04:59  Ref. Range 10/06/2014 00:07  Glucose-Capillary Latest Range: 70-99 mg/dL 22 (LL)    Treatment: D50 IV 25 mL  Symptoms: None  Follow-up CBG: Time: CBG Result:  Results for AAYANSH, HANCOX (MRN [id number]) as of 10/06/2014 04:59  Ref. Range 10/06/2014 00:51  Glucose-Capillary Latest Range: 70-99 mg/dL 84   Possible Reasons for Event: Inadequate meal intake  Comments/MD notified:    Viviano Simas  Remember to initiate Hypoglycemia Order Set & complete

## 2014-10-07 LAB — GLUCOSE, CAPILLARY
Glucose-Capillary: 148 mg/dL — ABNORMAL HIGH (ref 70–99)
Glucose-Capillary: 150 mg/dL — ABNORMAL HIGH (ref 70–99)
Glucose-Capillary: 175 mg/dL — ABNORMAL HIGH (ref 70–99)
Glucose-Capillary: 197 mg/dL — ABNORMAL HIGH (ref 70–99)
Glucose-Capillary: 206 mg/dL — ABNORMAL HIGH (ref 70–99)
Glucose-Capillary: 240 mg/dL — ABNORMAL HIGH (ref 70–99)

## 2014-10-07 LAB — PARATHYROID HORMONE, INTACT (NO CA): PTH: 154 pg/mL — ABNORMAL HIGH (ref 15–65)

## 2014-10-07 LAB — BASIC METABOLIC PANEL
Anion gap: 17 — ABNORMAL HIGH (ref 5–15)
BUN: 45 mg/dL — ABNORMAL HIGH (ref 6–23)
CO2: 23 mmol/L (ref 19–32)
Calcium: 10.1 mg/dL (ref 8.4–10.5)
Chloride: 104 mEq/L (ref 96–112)
Creatinine, Ser: 5.3 mg/dL — ABNORMAL HIGH (ref 0.50–1.35)
GFR calc Af Amer: 11 mL/min — ABNORMAL LOW (ref >90)
GFR calc non Af Amer: 10 mL/min — ABNORMAL LOW (ref >90)
Glucose, Bld: 150 mg/dL — ABNORMAL HIGH (ref 70–99)
Potassium: 3.7 mmol/L (ref 3.5–5.1)
Sodium: 144 mmol/L (ref 135–145)

## 2014-10-07 MED ORDER — BENZONATATE 100 MG PO CAPS
100.0000 mg | ORAL_CAPSULE | Freq: Two times a day (BID) | ORAL | Status: DC
  Administered 2014-10-07 – 2014-10-08 (×3): 100 mg via ORAL
  Filled 2014-10-07 (×3): qty 1

## 2014-10-07 MED ORDER — FLUTICASONE PROPIONATE 50 MCG/ACT NA SUSP
1.0000 | Freq: Every day | NASAL | Status: DC
  Administered 2014-10-07 – 2014-10-08 (×2): 1 via NASAL
  Filled 2014-10-07: qty 16

## 2014-10-07 NOTE — Progress Notes (Signed)
Inpatient Diabetes Program Recommendations  AACE/ADA: New Consensus Statement on Inpatient Glycemic Control (2013)  Target Ranges:  Prepandial:   less than 140 mg/dL      Peak postprandial:   less than 180 mg/dL (1-2 hours)      Critically ill patients:  140 - 180 mg/dL  Results for [name], [name] (MRN [id number]) as of 10/07/2014 09:23  Ref. Range 10/06/2014 08:01 10/06/2014 09:10 10/06/2014 11:26 10/06/2014 13:17 10/06/2014 14:24 10/06/2014 16:33 10/06/2014 20:15 10/07/2014 00:14 10/07/2014 04:07 10/07/2014 08:09  Glucose-Capillary Latest Range: 70-99 mg/dL 93 147 (H) 157 (H) 157 (H) 234 (H) 237 (H) 227 (H) 175 (H) 150 (H) 148 (H)   Diabetes history: DM2 Outpatient Diabetes medications: Lantus 8 units QHS, Glipizide 10 mg BID Current orders for Inpatient glycemic control: CBGs Q4H  Inpatient Diabetes Program Recommendations Correction (SSI): Noted CBGs are now stable and over the past 24 hours CBGs have ranged from 93-237 mg/dl. Please consider ordering CBGs with Novolog sensitive correction ACHS.   Thanks, Barnie Alderman, RN, MSN, CCRN, CDE Diabetes Coordinator Inpatient Diabetes Program [phone] (Team Pager) [phone] (AP office) [phone] [facility] office)

## 2014-10-07 NOTE — Progress Notes (Signed)
[facility] ROUNDING NOTE   Subjective:   Interval History: no complaints  Objective:  Vital signs in last 24 hours:  Temp:  [98.1 F (36.7 C)-98.8 F (37.1 C)] 98.4 F (36.9 C) (01/14 0920) Pulse Rate:  [61-127] 61 (01/14 0920) Resp:  [16-18] 16 (01/14 0920) BP: (112-166)/(47-68) 155/50 mmHg (01/14 0920) SpO2:  [95 %-100 %] 95 % (01/14 1019) Weight:  [100.6 kg (221 lb 12.5 oz)] 100.6 kg (221 lb 12.5 oz) (01/13 2011)  Weight change: -3.682 kg (-8 lb 1.9 oz) Filed Weights   10/05/14 1449 10/05/14 2100 10/06/14 2011  Weight: 104.282 kg (229 lb 14.4 oz) 103.3 kg (227 lb 11.8 oz) 100.6 kg (221 lb 12.5 oz)    Intake/Output: I/O last 3 completed shifts: In: 780 [P.O.:780] Out: 3350 [Urine:3350]   Intake/Output this shift:  Total I/O In: 120 [P.O.:120] Out: -   Mental status - alert, oriented  Chest - decreased air entry noted bilat, rhonchi noted bilat Heart - RRR 2/6 Abdomen - obese Extremities - AVF RUA B&T, marginal, Tr DP , bilat Fem bruits   Basic Metabolic Panel:  Recent Labs Lab 10/05/14 1441 10/05/14 1938 10/06/14 0610 10/07/14 0646  NA 140 140 136 144  K 6.1* 5.0 4.0 3.7  CL 106 107 103 104  CO2 22 24 21 23   GLUCOSE 101* 58* 71 150*  BUN 56* 56* 53* 45*  CREATININE 5.76* 5.59* 5.63* 5.30*  CALCIUM 9.7 10.0 9.6 10.1  PHOS  --   --  6.1*  --     Liver Function Tests:  Recent Labs Lab 10/05/14 1441 10/06/14 0610  AST 21  --   ALT 10  --   ALKPHOS 32*  --   BILITOT 0.6  --   PROT 5.9*  --   ALBUMIN 3.1* 3.0*   No results for input(s): LIPASE, AMYLASE in the last 168 hours. No results for input(s): AMMONIA in the last 168 hours.  CBC:  Recent Labs Lab 10/05/14 1441 10/06/14 0610  WBC 5.7 5.0  HGB 9.0* 8.9*  HCT 27.8* 27.2*  MCV 100.7* 100.7*  PLT 134* 125*    Cardiac Enzymes: No results for input(s): CKTOTAL, CKMB, CKMBINDEX, TROPONINI in the last 168 hours.  BNP: Invalid input(s): POCBNP  CBG:  Recent  Labs Lab 10/06/14 1633 10/06/14 2015 10/07/14 0014 10/07/14 0407 10/07/14 0809  GLUCAP 237* 227* 175* 150* 148*    Microbiology: No results found for this or any previous visit.  Coagulation Studies: No results for input(s): LABPROT, INR in the last 72 hours.  Urinalysis:  Recent Labs  10/05/14 2037  COLORURINE YELLOW  LABSPEC 1.009  PHURINE 6.5  GLUCOSEU NEGATIVE  HGBUR TRACE*  BILIRUBINUR NEGATIVE  KETONESUR NEGATIVE  PROTEINUR 100*  UROBILINOGEN 0.2  NITRITE NEGATIVE  LEUKOCYTESUR NEGATIVE      Imaging: Dg Chest 2 View  10/05/2014   CLINICAL DATA:  Shortness of breath 1 day.  EXAM: CHEST  2 VIEW  COMPARISON:  June 21, 2014.  FINDINGS: The heart size and mediastinal contours are within normal limits. Both lungs are clear. No pneumothorax or pleural effusion is noted. The visualized skeletal structures are unremarkable.  IMPRESSION: No acute cardiopulmonary abnormality seen.   Electronically Signed   By: Sabino Dick M.D.   On: 10/05/2014 15:12     Medications:     . allopurinol  100 mg Oral Daily  . aspirin  325 mg Oral BID  . calcitRIOL  0.25 mcg Oral Once per day  on Sun Tue Thu Sat   And  . calcitRIOL  0.5 mcg Oral Once per day on Mon Wed Fri  . [START ON 10/12/2014] darbepoetin (ARANESP) injection - NON-DIALYSIS  150 mcg Subcutaneous Q Tue-1800  . docusate sodium  100 mg Oral BID  . fluticasone  1 spray Each Nare Daily  . furosemide  160 mg Oral BID  . heparin  5,000 Units Subcutaneous 3 times per day  . ipratropium-albuterol  3 mL Nebulization BID  . labetalol  400 mg Oral BID  . levothyroxine  50 mcg Oral QAC breakfast  . NIFEdipine  60 mg Oral Daily  . sevelamer carbonate  800 mg Oral TID WC  . simvastatin  10 mg Oral QHS  . sodium bicarbonate  1,300 mg Oral BID  . sodium chloride  3 mL Intravenous Q12H  . 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

## 2014-10-07 NOTE — Progress Notes (Signed)
[facility]                                                                              Patient Demographics  [name], is a 74 y.o. male, DOB - 03/29/1941, [id number]  Admit date - 10/05/2014   Admitting Physician Elmarie Shiley, MD  Outpatient Primary MD for the patient is Elby Showers, MD  LOS - 2   Chief Complaint  Patient presents with  . Shortness of Breath  . Cough      HPI on 10/05/2014 by Dr. Niel Hummer [name] is a 74 y.o. male with PMH significant for CKD stage IV, diabetes, HTN, hypothyroidism who presents complaining of SOB on exertion for last 2 days. This has been associated with productive cough, runny nose. He has had 5 pounds weight gain. Wife has notice that he has swelling of the face and his abdomen is more distended. He denies chest pain, abdominal pain, diarrhea, no fevers, no sick contact.  Patient has been more confuse for last 2 week, wife relate. He has had low blood sugar during the episodes of confusion. His blood sugar last night was 34, 50.   Assessment & Plan   Hyperkalemia -In setting of renal failure. No EKG changes.  -Was given insulin, Amp D 50, kayexalate, lasix.  -Resolved  Dyspnea/Bronchitis -in setting volume overload in setting renal failure and or Bronchitis.  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  -Continue lasix PO -Nephrologist consulted and appreciated -Continue to monitor intake and output  Hypertension -stable, restarted labetalol, nifedipine.   Bradycardia -Resolved  Diabetes Mellitus, type 2 -Episodes of hypoglycemia at home. Hold oral hypoglycemic medication, and insulin. CBG every 4 hours.  Code Status: Full  Family Communication: None at bedside  Disposition Plan: Admitted.   Time Spent in minutes    30 minutes  Procedures  None  Consults   Nephrology  DVT Prophylaxis  Heparin  Lab Results  Component Value Date   PLT 125* 10/06/2014    Medications  Scheduled Meds: . allopurinol  100 mg Oral Daily  . aspirin  325 mg Oral BID  . calcitRIOL  0.25 mcg Oral Once per day on Sun Tue Thu Sat   And  . calcitRIOL  0.5 mcg Oral Once per day on Mon Wed Fri  . [START ON 10/12/2014] darbepoetin (ARANESP) injection - NON-DIALYSIS  150 mcg Subcutaneous Q Tue-1800  . docusate sodium  100 mg Oral BID  . fluticasone  1 spray Each Nare Daily  . furosemide  160 mg Oral BID  . heparin  5,000 Units Subcutaneous 3 times per day  . ipratropium-albuterol  3 mL Nebulization BID  . labetalol  400 mg Oral BID  . levothyroxine  50 mcg Oral QAC breakfast  . NIFEdipine  60 mg Oral Daily  . sevelamer carbonate  800 mg Oral TID WC  . simvastatin  10 mg Oral QHS  . sodium bicarbonate  1,300 mg Oral BID  . sodium chloride  3 mL Intravenous  Q12H  . sodium chloride  3 mL Intravenous Q12H   Continuous Infusions:  PRN Meds:.sodium chloride, acetaminophen **OR** acetaminophen, albuterol, HYDROcodone-acetaminophen, sodium chloride  Antibiotics    Anti-infectives    None      Subjective:   [name] seen and examined today.  Patient continues to complain of SOB, but states it has improved. He continues to cough.  He denies chest pain, dizziness, abdominal pain.  Objective:   Filed Vitals:   10/06/14 2011 10/07/14 0431 10/07/14 0920 10/07/14 1019  BP: 112/54 139/47 155/50   Pulse: 127 62 61   Temp: 98.4 F (36.9 C) 98.1 F (36.7 C) 98.4 F (36.9 C)   TempSrc: Oral Oral Oral   Resp: 18 18 16    Height:      Weight: 100.6 kg (221 lb 12.5 oz)     SpO2: 99% 100% 97% 95%    Wt Readings from Last 3 Encounters:  10/06/14 100.6 kg (221 lb 12.5 oz)  09/08/14 106.369 kg (234 lb 8 oz)  08/06/14 104.781 kg (231 lb)     Intake/Output Summary (Last 24 hours) at 10/07/14 1408 Last data filed  at 10/07/14 1320  Gross per 24 hour  Intake    480 ml  Output   1400 ml  Net   -920 ml    Exam  General: Well developed, well nourished, NAD, appears stated age  HEENT: NCAT, mucous membranes moist.   Cardiovascular: S1 S2 auscultated, 2/6 SEM, Regular rate and rhythm.  Respiratory: Clear to auscultation bilaterally with equal chest rise  Abdomen: Soft, obese, nontender, nondistended, + bowel sounds  Extremities: warm dry without cyanosis clubbing or edema  Neuro: AAOx3, nonfocal  Skin: Without rashes exudates or nodules  Psych: appropriate mood and affect  Data Review   Micro Results No results found for this or any previous visit (from the past 240 hour(s)).  Radiology Reports Dg Chest 2 View  10/05/2014   CLINICAL DATA:  Shortness of breath 1 day.  EXAM: CHEST  2 VIEW  COMPARISON:  June 21, 2014.  FINDINGS: The heart size and mediastinal contours are within normal limits. Both lungs are clear. No pneumothorax or pleural effusion is noted. The visualized skeletal structures are unremarkable.  IMPRESSION: No acute cardiopulmonary abnormality seen.   Electronically Signed   By: Sabino Dick M.D.   On: 10/05/2014 15:12    CBC  Recent Labs Lab 10/05/14 1441 10/06/14 0610  WBC 5.7 5.0  HGB 9.0* 8.9*  HCT 27.8* 27.2*  PLT 134* 125*  MCV 100.7* 100.7*  MCH 32.6 33.0  MCHC 32.4 32.7  RDW 17.0* 17.2*    Chemistries   Recent Labs Lab 10/05/14 1441 10/05/14 1938 10/06/14 0610 10/07/14 0646  NA 140 140 136 144  K 6.1* 5.0 4.0 3.7  CL 106 107 103 104  CO2 22 24 21 23   GLUCOSE 101* 58* 71 150*  BUN 56* 56* 53* 45*  CREATININE 5.76* 5.59* 5.63* 5.30*  CALCIUM 9.7 10.0 9.6 10.1  AST 21  --   --   --   ALT 10  --   --   --   ALKPHOS 32*  --   --   --   BILITOT 0.6  --   --   --    ------------------------------------------------------------------------------------------------------------------ estimated creatinine clearance is 14.9 mL/min (by C-G  formula based on Cr of 5.3). ------------------------------------------------------------------------------------------------------------------ No results for input(s): HGBA1C in the last 72 hours. ------------------------------------------------------------------------------------------------------------------ No results for input(s): CHOL, HDL, LDLCALC,  TRIG, CHOLHDL, LDLDIRECT in the last 72 hours. ------------------------------------------------------------------------------------------------------------------ No results for input(s): TSH, T4TOTAL, T3FREE, THYROIDAB in the last 72 hours.  Invalid input(s): FREET3 ------------------------------------------------------------------------------------------------------------------  Recent Labs  10/06/14 0610  TIBC 469*  IRON 331*    Coagulation profile No results for input(s): INR, PROTIME in the last 168 hours.  No results for input(s): DDIMER in the last 72 hours.  Cardiac Enzymes No results for input(s): CKMB, TROPONINI, MYOGLOBIN in the last 168 hours.  Invalid input(s): CK ------------------------------------------------------------------------------------------------------------------ Invalid input(s): POCBNP    [name] D.O. on 10/07/2014 at 2:08 PM  Between 7am to 7pm - Pager - [phone]  After 7pm go to [email or web address] - password [email or web address]  And look for the night coverage person covering for me after hours  [facility] Office  [phone]

## 2014-10-08 LAB — GLUCOSE, CAPILLARY
Glucose-Capillary: 176 mg/dL — ABNORMAL HIGH (ref 70–99)
Glucose-Capillary: 141 mg/dL — ABNORMAL HIGH (ref 70–99)
Glucose-Capillary: 142 mg/dL — ABNORMAL HIGH (ref 70–99)

## 2014-10-08 MED ORDER — LORATADINE 10 MG PO TABS
10.0000 mg | ORAL_TABLET | Freq: Every day | ORAL | Status: DC
  Administered 2014-10-08: 10 mg via ORAL
  Filled 2014-10-08: qty 1

## 2014-10-08 MED ORDER — FLUTICASONE PROPIONATE 50 MCG/ACT NA SUSP: 1.0000 | Freq: Every day | NASAL | Status: AC

## 2014-10-08 MED ORDER — AZITHROMYCIN 500 MG PO TABS: 500.0000 mg | ORAL_TABLET | Freq: Once | ORAL | Status: DC

## 2014-10-08 MED ORDER — BENZONATATE 100 MG PO CAPS: 100.0000 mg | ORAL_CAPSULE | Freq: Two times a day (BID) | ORAL | Status: DC

## 2014-10-08 MED ORDER — DSS 100 MG PO CAPS: 100.0000 mg | ORAL_CAPSULE | Freq: Two times a day (BID) | ORAL | Status: DC

## 2014-10-08 NOTE — Discharge Summary (Signed)
Physician Discharge Summary  [name] [id number] DOB: December 23, 1940 DOA: 10/05/2014  PCP: Elby Showers, MD  Admit date: 10/05/2014 Discharge date: 10/08/2014  Time spent: 45 minutes  Recommendations for Outpatient Follow-up:  Patient will be discharged to home.  He is to follow-up with his primary care physician within one week of discharge for diabetes management. Patient to continue his medications as prescribed. Patient should follow a heart-renal healthy/carb modified diet with 1221ml fluid restriction. Patient may resume activity as tolerated.  Discharge Diagnoses:  Hyperkalemia Dyspnea/bronchitis CKD stage V Hypertension Bradycardia Diabetes mellitus type 2   Discharge Condition: Stable  Diet recommendation: heart-renal healthy/carb modified diet with 1293ml fluid restriction.  Filed Weights   10/05/14 1449 10/05/14 2100 10/06/14 2011  Weight: 104.282 kg (229 lb 14.4 oz) 103.3 kg (227 lb 11.8 oz) 100.6 kg (221 lb 12.5 oz)    History of present illness:  on 10/05/2014 by Dr. Niel Hummer [name] is a 74 y.o. male with PMH significant for CKD stage IV, diabetes, HTN, hypothyroidism who presents complaining of SOB on exertion for last 2 days. This has been associated with productive cough, runny nose. He has had 5 pounds weight gain. Wife has notice that he has swelling of the face and his abdomen is more distended. He denies chest pain, abdominal pain, diarrhea, no fevers, no sick contact.  Patient has been more confuse for last 2 week, wife relate. He has had low blood sugar during the episodes of confusion. His blood sugar last night was 34, 50.   Hospital Course:  Hyperkalemia -In setting of renal failure. No EKG changes.  -Was given insulin, Amp D 50, kayexalate, lasix.  -Resolved  Dyspnea/Bronchitis -in setting volume overload in setting renal failure and or Bronchitis.  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  -Continue lasix PO -Nephrologist consulted and appreciated  Hypertension -stable, continue labetalol, nifedipine.   Bradycardia -Resolved  Diabetes Mellitus, type 2 -Episodes of hypoglycemia at home. Held oral hypoglycemic medication, and insulin during hospitalization.  -Patient will need to follow up with his PCP regarding diabetes management. -Continue glipizide but hold lantus.  Procedures: None  Consultations: Nephrology  Discharge Exam: Filed Vitals:   10/08/14 0618  BP: 152/52  Pulse: 62  Temp: 98.1 F (36.7 C)  Resp: 18     General: Well developed, well nourished, NAD, appears stated age  HEENT: NCAT, mucous membranes moist.  Cardiovascular: S1 S2 auscultated, RRR, 2/6 SEM  Respiratory: Clear to auscultation bilaterally, faint exp wheezing  Abdomen: Soft, obese, nontender, nondistended, + bowel sounds  Extremities: warm dry without cyanosis clubbing or edema  Neuro: AAOx3, nonfocal  Psych: Normal affect and demeanor with intact judgement and insight  Discharge Instructions      Discharge Instructions    Discharge instructions    Complete by:  As directed   Patient will be discharged to home.  He is to follow-up with his primary care physician within one week of discharge for diabetes management. Patient to continue his medications as prescribed. Patient should follow a heart-renal healthy/carb modified diet with 12104ml fluid restriction. Patient may resume activity as tolerated.            Medication List    STOP taking these medications  azithromycin 250 MG tablet  Commonly known as:  ZITHROMAX     cyanocobalamin 1000 MCG/ML injection  Commonly known as:  (VITAMIN B-12)      TAKE these medications        allopurinol 100 MG  tablet  Commonly known as:  ZYLOPRIM  Take 100 mg by mouth daily.     aspirin 325 MG tablet  Take 325 mg by mouth 2 (two) times daily.     benzonatate 100 MG capsule  Commonly known as:  TESSALON  Take 1 capsule (100 mg total) by mouth 2 (two) times daily.     calcitRIOL 0.25 MCG capsule  Commonly known as:  ROCALTROL  - Take 0.25-0.5 mcg by mouth See admin instructions. Takes 2 capsules on Mon, Wed and Fri  - Takes 1 capsule all other days     clobetasol ointment 0.05 %  Commonly known as:  TEMOVATE  Apply 1 application topically 2 (two) times daily. Sores on head     DSS 100 MG Caps  Take 100 mg by mouth 2 (two) times daily.     fluticasone 50 MCG/ACT nasal spray  Commonly known as:  FLONASE  Place 1 spray into both nostrils daily.     furosemide 80 MG tablet  Commonly known as:  LASIX  Take 80 mg by mouth 2 (two) times daily.     glipiZIDE 10 MG tablet  Commonly known as:  GLUCOTROL  Take 10 mg by mouth 2 (two) times daily before a meal.     insulin glargine 100 UNIT/ML injection  Commonly known as:  LANTUS  Inject 8 Units into the skin at bedtime.     ipratropium 0.03 % nasal spray  Commonly known as:  ATROVENT  Place 2 sprays into the nose daily as needed for rhinitis.     labetalol 200 MG tablet  Commonly known as:  NORMODYNE  Take 400 mg by mouth 2 (two) times daily.     levothyroxine 50 MCG tablet  Commonly known as:  SYNTHROID, LEVOTHROID  Take 1 tablet (50 mcg total) by mouth daily.     loratadine 10 MG tablet  Commonly known as:  CLARITIN  Take 10 mg by mouth daily.     NIFEdipine 60 MG 24 hr tablet  Commonly known as:  PROCARDIA-XL/ADALAT CC  Take 60 mg by mouth daily.     pioglitazone 45 MG tablet  Commonly known as:  ACTOS  Take 45 mg by mouth daily.     RENVELA 800 MG tablet  Generic drug:  sevelamer carbonate  Take 800 mg by mouth 3 (three) times daily with meals.     simvastatin 20 MG tablet  Commonly known as:  ZOCOR  Take 10 mg  by mouth at bedtime.     triamcinolone ointment 0.1 %  Commonly known as:  KENALOG  Apply 1 application topically 2 (two) times daily as needed. Use between thighs       Allergies  Allergen Reactions  . Penicillins Rash   Follow-up Information    Follow up with Elby Showers, MD. Schedule an appointment as soon as possible for a visit in 1 week.   Specialty:  Internal Medicine   Why:  Hospital followup, diabetes management   Contact information:   [address] [place] [phone]        The results of significant diagnostics from this hospitalization (including imaging, microbiology, ancillary and laboratory) are listed below for reference.  Significant Diagnostic Studies: Dg Chest 2 View  10/05/2014   CLINICAL DATA:  Shortness of breath 1 day.  EXAM: CHEST  2 VIEW  COMPARISON:  June 21, 2014.  FINDINGS: The heart size and mediastinal contours are within normal limits. Both lungs are clear. No pneumothorax or pleural effusion is noted. The visualized skeletal structures are unremarkable.  IMPRESSION: No acute cardiopulmonary abnormality seen.   Electronically Signed   By: Sabino Dick M.D.   On: 10/05/2014 15:12    Microbiology: No results found for this or any previous visit (from the past 240 hour(s)).   Labs: Basic Metabolic Panel:  Recent Labs Lab 10/05/14 1441 10/05/14 1938 10/06/14 0610 10/07/14 0646  NA 140 140 136 144  K 6.1* 5.0 4.0 3.7  CL 106 107 103 104  CO2 22 24 21 23   GLUCOSE 101* 58* 71 150*  BUN 56* 56* 53* 45*  CREATININE 5.76* 5.59* 5.63* 5.30*  CALCIUM 9.7 10.0 9.6 10.1  PHOS  --   --  6.1*  --    Liver Function Tests:  Recent Labs Lab 10/05/14 1441 10/06/14 0610  AST 21  --   ALT 10  --   ALKPHOS 32*  --   BILITOT 0.6  --   PROT 5.9*  --   ALBUMIN 3.1* 3.0*   No results for input(s): LIPASE, AMYLASE in the last 168 hours. No results for input(s): AMMONIA in the last 168 hours. CBC:  Recent  Labs Lab 10/05/14 1441 10/06/14 0610  WBC 5.7 5.0  HGB 9.0* 8.9*  HCT 27.8* 27.2*  MCV 100.7* 100.7*  PLT 134* 125*   Cardiac Enzymes: No results for input(s): CKTOTAL, CKMB, CKMBINDEX, TROPONINI in the last 168 hours. BNP: BNP (last 3 results) No results for input(s): PROBNP in the last 8760 hours. CBG:  Recent Labs Lab 10/07/14 1643 10/07/14 2018 10/07/14 2351 10/08/14 0409 10/08/14 0749  GLUCAP 240* 197* 176* 141* 142*       Signed:  Elfreda Blanchet  [facility] 10/08/2014, 10:10 AM

## 2014-10-08 NOTE — Care Management Note (Signed)
CARE MANAGEMENT NOTE 10/08/2014  Patient:  [name], [name]   Account Number:  [id number]  Date Initiated:  10/08/2014  Documentation initiated by:  Nayra Coury  Subjective/Objective Assessment:   CM following for progression and d/c planning.     Action/Plan:   Met with pt re d/c , no needs identified, pt lives at home with wife who is able to assist pt if needed.   Anticipated DC Date:  10/08/2014   Anticipated DC Plan:  HOME/SELF CARE         Choice offered to / List presented to:             Status of service:  Completed, signed off Medicare Important Message given?  YES (If response is "NO", the following Medicare IM given date fields will be blank) Date Medicare IM given:  10/08/2014 Medicare IM given by:  Shamir Sedlar Date Additional Medicare IM given:   Additional Medicare IM given by:    Discharge Disposition:  HOME/SELF CARE  Per UR Regulation:    If discussed at Long Length of Stay Meetings, dates discussed:    Comments:

## 2014-10-08 NOTE — Progress Notes (Signed)
Pt discharge instructions given, pt verbalized understanding. VSS. Denies pain. Pleasant.  Pt left floor via wheelchair accompanied by staff.

## 2014-10-08 NOTE — Discharge Instructions (Signed)
Chronic Kidney Disease °Chronic kidney disease occurs when the kidneys are damaged over a long period. The kidneys are two organs that lie on either side of the spine between the middle of the back and the front of the abdomen. The kidneys:  °· Remove wastes and extra water from the blood.   °· Produce important hormones. These help keep bones strong, regulate blood pressure, and help create red blood cells.   °· Balance the fluids and chemicals in the blood and tissues. °A small amount of kidney damage may not cause problems, but a large amount of damage may make it difficult or impossible for the kidneys to work the way they should. If steps are not taken to slow down the kidney damage or stop it from getting worse, the kidneys may stop working permanently. Most of the time, chronic kidney disease does not go away. However, it can often be controlled, and those with the disease can usually live normal lives. °CAUSES  °The most common causes of chronic kidney disease are diabetes and high blood pressure (hypertension). Chronic kidney disease may also be caused by:  °· Diseases that cause the kidneys' filters to become inflamed.   °· Diseases that affect the immune system.   °· Genetic diseases.   °· Medicines that damage the kidneys, such as anti-inflammatory medicines.   °· Poisoning or exposure to toxic substances.   °· A reoccurring kidney or urinary infection.   °· A problem with urine flow. This may be caused by:   °¨ Cancer.   °¨ Kidney stones.   °¨ An enlarged prostate in males. °SIGNS AND SYMPTOMS  °Because the kidney damage in chronic kidney disease occurs slowly, symptoms develop slowly and may not be obvious until the kidney damage becomes severe. A person may have a kidney disease for years without showing any symptoms. Symptoms can include:  °· Swelling (edema) of the legs, ankles, or feet.   °· Tiredness (lethargy).   °· Nausea or vomiting.   °· Confusion.   °· Problems with urination, such as:    °¨ Decreased urine production.   °¨ Frequent urination, especially at night.   °¨ Frequent accidents in children who are potty trained.   °· Muscle twitches and cramps.   °· Shortness of breath.  °· Weakness.   °· Persistent itchiness.   °· Loss of appetite. °· Metallic taste in the mouth. °· Trouble sleeping. °· Slowed development in children. °· Short stature in children. °DIAGNOSIS  °Chronic kidney disease may be detected and diagnosed by tests, including blood, urine, imaging, or kidney biopsy tests.  °TREATMENT  °Most chronic kidney diseases cannot be cured. Treatment usually involves relieving symptoms and preventing or slowing the progression of the disease. Treatment may include:  °· A special diet. You may need to avoid alcohol and foods that are salty and high in potassium.   °· Medicines. These may:   °¨ Lower blood pressure.   °¨ Relieve anemia.   °¨ Relieve swelling.   °¨ Protect the bones. °HOME CARE INSTRUCTIONS  °· Follow your prescribed diet.   °· Take medicines only as directed by your health care provider. Do not take any new medicines (prescription, over-the-counter, or nutritional supplements) unless approved by your health care provider. Many medicines can worsen your kidney damage or need to have the dose adjusted.   °· Quit smoking if you smoke. Talk to your health care provider about a smoking cessation program.   °· Keep all follow-up visits as directed by your health care provider. °SEEK IMMEDIATE MEDICAL CARE IF: °· Your symptoms get worse or you develop new symptoms.   °· You develop symptoms of end-stage kidney disease. These   include:   °¨ Headaches.   °¨ Abnormally dark or light skin.   °¨ Numbness in the hands or feet.   °¨ Easy bruising.   °¨ Frequent hiccups.   °¨ Menstruation stops.   °· You have a fever.   °· You have decreased urine production.   °· You have pain or bleeding when urinating. °MAKE SURE YOU: °· Understand these instructions. °· Will watch your condition. °· Will  get help right away if you are not doing well or get worse. °FOR MORE INFORMATION  °· American Association of Kidney Patients: www.aakp.org °· National Kidney Foundation: www.kidney.org °· American Kidney Fund: www.akfinc.org °· [employer] [facility]: www.lifeoptions.org and www.kidneyschool.org °Document Released: 06/19/2008 Document Revised: 01/25/2014 Document Reviewed: 05/09/2012 °ExitCare® Patient Information ©2015 [employer]. This information is not intended to replace advice given to you by your health care provider. Make sure you discuss any questions you have with your health care provider. ° °

## 2014-10-11 ENCOUNTER — Encounter: Payer: Self-pay | Admitting: Internal Medicine

## 2014-10-11 ENCOUNTER — Ambulatory Visit (INDEPENDENT_AMBULATORY_CARE_PROVIDER_SITE_OTHER): Payer: Medicare Other | Admitting: Internal Medicine

## 2014-10-11 VITALS — BP 124/58 | HR 59 | Temp 98.0°F | Wt 216.0 lb

## 2014-10-11 DIAGNOSIS — J0100 Acute maxillary sinusitis, unspecified: Secondary | ICD-10-CM

## 2014-10-11 DIAGNOSIS — D638 Anemia in other chronic diseases classified elsewhere: Secondary | ICD-10-CM | POA: Diagnosis not present

## 2014-10-11 DIAGNOSIS — J069 Acute upper respiratory infection, unspecified: Secondary | ICD-10-CM

## 2014-10-11 DIAGNOSIS — N189 Chronic kidney disease, unspecified: Secondary | ICD-10-CM

## 2014-10-11 LAB — BASIC METABOLIC PANEL
BUN: 41 mg/dL — ABNORMAL HIGH (ref 6–23)
CO2: 26 mEq/L (ref 19–32)
Calcium: 10.1 mg/dL (ref 8.4–10.5)
Chloride: 104 mEq/L (ref 96–112)
Creat: 4.68 mg/dL — ABNORMAL HIGH (ref 0.50–1.35)
Glucose, Bld: 172 mg/dL — ABNORMAL HIGH (ref 70–99)
Potassium: 4.3 mEq/L (ref 3.5–5.3)
Sodium: 138 mEq/L (ref 135–145)

## 2014-10-11 LAB — CBC WITH DIFFERENTIAL/PLATELET
Basophils Absolute: 0.1 10*3/uL (ref 0.0–0.1)
Basophils Relative: 1 % (ref 0–1)
Eosinophils Absolute: 0.3 10*3/uL (ref 0.0–0.7)
Eosinophils Relative: 4 % (ref 0–5)
HCT: 30.3 % — ABNORMAL LOW (ref 39.0–52.0)
Hemoglobin: 9.6 g/dL — ABNORMAL LOW (ref 13.0–17.0)
Lymphocytes Relative: 26 % (ref 12–46)
Lymphs Abs: 2 10*3/uL (ref 0.7–4.0)
MCH: 32.5 pg (ref 26.0–34.0)
MCHC: 31.7 g/dL (ref 30.0–36.0)
MCV: 102.7 fL — ABNORMAL HIGH (ref 78.0–100.0)
MPV: 9.9 fL (ref 8.6–12.4)
Monocytes Absolute: 0.7 10*3/uL (ref 0.1–1.0)
Monocytes Relative: 9 % (ref 3–12)
Neutro Abs: 4.6 10*3/uL (ref 1.7–7.7)
Neutrophils Relative %: 60 % (ref 43–77)
Platelets: 203 10*3/uL (ref 150–400)
RBC: 2.95 MIL/uL — ABNORMAL LOW (ref 4.22–5.81)
RDW: 16 % — ABNORMAL HIGH (ref 11.5–15.5)
WBC: 7.7 10*3/uL (ref 4.0–10.5)

## 2014-10-11 MED ORDER — LEVOFLOXACIN 250 MG PO TABS: ORAL_TABLET | ORAL | Status: DC

## 2014-10-11 NOTE — Progress Notes (Signed)
   Subjective:    Patient ID: [name], male    DOB: October 04, 1940, 74 y.o.   MRN: [id number]  HPI  He has chronic kidney disease and is looking at going on dialysis in the near future. He has had an AV fistula placed. Last week wife called and said he was swollen and short of breath. We advise him to go to the emergency department. He was found be hyperkalemic, potassium was 6.1 , with elevated BNP. Apparently was also thought to have an acute respiratory infection/bronchitis. Creatinine in October was 4.9 and most recently was 5.59. He was not discharged home on antibiotics. Says that nephrologist saw him in consultation and thought he had a red ear and a red throat. He was admitted by hospitalist. Patient thought that he just had a respiratory infection and did not realize quite how sick he was with electrolyte abnormalities and elevated BNP. Has cough and congestion which is persistent. He was to go back to work. I have told him he is better off staying home and resting for several days. He seems annoyed about that.    Review of Systems     Objective:   Physical Exam  Skin warm and dry. Nodes none. TMs are full bilaterally and slightly pink but not red. Pharynx slightly injected. Neck is supple without adenopathy. Chest completely clear to auscultation without rales or wheezing      Assessment & Plan:  Acute URI  Anemia of chronic disease-CBC drawn today  Chronic kidney disease  Plan: Levaquin 250 mg every 48 hours #5 tablets given. Stay out of work for one week. Rest.

## 2014-10-11 NOTE — Patient Instructions (Addendum)
Stay out of work x one week. Levaquin 250 mg every 48 hours #5. Lab work is pending. Follow-up on anemia.

## 2014-10-12 ENCOUNTER — Telehealth: Payer: Self-pay | Admitting: *Deleted

## 2014-10-12 NOTE — Telephone Encounter (Signed)
Patient called states he needs a note for work he says he was instructed to stay out this week when he was here for office visit> He would like to pick up note on Wednesday.

## 2014-10-12 NOTE — Telephone Encounter (Signed)
Note written

## 2014-10-13 ENCOUNTER — Encounter (HOSPITAL_COMMUNITY)
Admission: RE | Admit: 2014-10-13 | Discharge: 2014-10-13 | Disposition: A | Discharge status: Home / Self Care | Payer: Medicare Other | Source: Ambulatory Visit | Attending: Nephrology | Admitting: Nephrology

## 2014-10-13 DIAGNOSIS — N184 Chronic kidney disease, stage 4 (severe): Secondary | ICD-10-CM | POA: Diagnosis not present

## 2014-10-13 DIAGNOSIS — D638 Anemia in other chronic diseases classified elsewhere: Secondary | ICD-10-CM | POA: Diagnosis not present

## 2014-10-13 MED ORDER — DARBEPOETIN ALFA 100 MCG/0.5ML IJ SOSY
100.0000 ug | PREFILLED_SYRINGE | INTRAMUSCULAR | Status: DC
  Administered 2014-10-13: 100 ug via SUBCUTANEOUS

## 2014-10-13 MED ORDER — DARBEPOETIN ALFA 100 MCG/0.5ML IJ SOSY
PREFILLED_SYRINGE | INTRAMUSCULAR | Status: AC
  Administered 2014-10-13: 100 ug via SUBCUTANEOUS
  Filled 2014-10-13: qty 0.5

## 2014-10-14 LAB — POCT HEMOGLOBIN-HEMACUE: Hemoglobin: 9.1 g/dL — ABNORMAL LOW (ref 13.0–17.0)

## 2014-10-21 ENCOUNTER — Encounter: Payer: Self-pay | Admitting: Internal Medicine

## 2014-10-27 ENCOUNTER — Encounter (HOSPITAL_COMMUNITY)
Admission: RE | Admit: 2014-10-27 | Discharge: 2014-10-27 | Disposition: A | Discharge status: Home / Self Care | Payer: Medicare Other | Source: Ambulatory Visit | Attending: Nephrology | Admitting: Nephrology

## 2014-10-27 DIAGNOSIS — D638 Anemia in other chronic diseases classified elsewhere: Secondary | ICD-10-CM | POA: Insufficient documentation

## 2014-10-27 DIAGNOSIS — N184 Chronic kidney disease, stage 4 (severe): Secondary | ICD-10-CM | POA: Diagnosis not present

## 2014-10-27 LAB — FERRITIN: Ferritin: 215 ng/mL (ref 22–322)

## 2014-10-27 LAB — IRON AND TIBC
Iron: 61 ug/dL (ref 42–165)
Saturation Ratios: 20 % (ref 20–55)
TIBC: 303 ug/dL (ref 215–435)
UIBC: 242 ug/dL (ref 125–400)

## 2014-10-27 LAB — POCT HEMOGLOBIN-HEMACUE: Hemoglobin: 9.9 g/dL — ABNORMAL LOW (ref 13.0–17.0)

## 2014-10-27 MED ORDER — DARBEPOETIN ALFA 100 MCG/0.5ML IJ SOSY
100.0000 ug | PREFILLED_SYRINGE | INTRAMUSCULAR | Status: DC
  Administered 2014-10-27: 100 ug via SUBCUTANEOUS

## 2014-10-27 MED ORDER — DARBEPOETIN ALFA 100 MCG/0.5ML IJ SOSY
PREFILLED_SYRINGE | INTRAMUSCULAR | Status: AC
  Administered 2014-10-27: 100 ug via SUBCUTANEOUS
  Filled 2014-10-27: qty 0.5

## 2014-10-29 ENCOUNTER — Encounter: Payer: Self-pay | Admitting: Internal Medicine

## 2014-10-29 ENCOUNTER — Ambulatory Visit (INDEPENDENT_AMBULATORY_CARE_PROVIDER_SITE_OTHER): Payer: Medicare Other | Admitting: Internal Medicine

## 2014-10-29 VITALS — BP 130/68 | HR 68 | Temp 98.0°F

## 2014-10-29 DIAGNOSIS — E538 Deficiency of other specified B group vitamins: Secondary | ICD-10-CM

## 2014-10-29 MED ORDER — CYANOCOBALAMIN 1000 MCG/ML IJ SOLN
1000.0000 ug | Freq: Once | INTRAMUSCULAR | Status: AC
  Administered 2014-10-29: 1000 ug via INTRAMUSCULAR

## 2014-10-29 NOTE — Progress Notes (Signed)
Patient presents today for B 12 injection. Patient VS stable . Patient tolerated well.

## 2014-11-08 ENCOUNTER — Other Ambulatory Visit (HOSPITAL_COMMUNITY): Payer: Self-pay | Admitting: *Deleted

## 2014-11-10 ENCOUNTER — Encounter (HOSPITAL_COMMUNITY)
Admission: RE | Admit: 2014-11-10 | Discharge: 2014-11-10 | Disposition: A | Discharge status: Home / Self Care | Payer: Medicare Other | Source: Ambulatory Visit | Attending: Nephrology | Admitting: Nephrology

## 2014-11-10 DIAGNOSIS — D638 Anemia in other chronic diseases classified elsewhere: Secondary | ICD-10-CM | POA: Diagnosis not present

## 2014-11-10 DIAGNOSIS — N184 Chronic kidney disease, stage 4 (severe): Secondary | ICD-10-CM | POA: Diagnosis not present

## 2014-11-10 LAB — POCT HEMOGLOBIN-HEMACUE: Hemoglobin: 10.6 g/dL — ABNORMAL LOW (ref 13.0–17.0)

## 2014-11-10 MED ORDER — SODIUM CHLORIDE 0.9 % IV SOLN
510.0000 mg | Freq: Once | INTRAVENOUS | Status: AC
  Administered 2014-11-10: 510 mg via INTRAVENOUS
  Filled 2014-11-10: qty 17

## 2014-11-10 MED ORDER — DARBEPOETIN ALFA 100 MCG/0.5ML IJ SOSY
100.0000 ug | PREFILLED_SYRINGE | INTRAMUSCULAR | Status: DC
  Administered 2014-11-10: 100 ug via SUBCUTANEOUS

## 2014-11-11 MED ORDER — DARBEPOETIN ALFA 100 MCG/0.5ML IJ SOSY
PREFILLED_SYRINGE | INTRAMUSCULAR | Status: AC
  Filled 2014-11-11: qty 0.5

## 2014-11-15 DIAGNOSIS — H43813 Vitreous degeneration, bilateral: Secondary | ICD-10-CM | POA: Diagnosis not present

## 2014-11-15 DIAGNOSIS — H52203 Unspecified astigmatism, bilateral: Secondary | ICD-10-CM | POA: Diagnosis not present

## 2014-11-15 DIAGNOSIS — Z961 Presence of intraocular lens: Secondary | ICD-10-CM | POA: Diagnosis not present

## 2014-11-15 DIAGNOSIS — E119 Type 2 diabetes mellitus without complications: Secondary | ICD-10-CM | POA: Diagnosis not present

## 2014-11-15 LAB — HM DIABETES EYE EXAM

## 2014-11-16 DIAGNOSIS — I770 Arteriovenous fistula, acquired: Secondary | ICD-10-CM | POA: Diagnosis not present

## 2014-11-16 DIAGNOSIS — N2581 Secondary hyperparathyroidism of renal origin: Secondary | ICD-10-CM | POA: Diagnosis not present

## 2014-11-16 DIAGNOSIS — I82B12 Acute embolism and thrombosis of left subclavian vein: Secondary | ICD-10-CM | POA: Diagnosis not present

## 2014-11-16 DIAGNOSIS — N185 Chronic kidney disease, stage 5: Secondary | ICD-10-CM | POA: Diagnosis not present

## 2014-11-16 DIAGNOSIS — D631 Anemia in chronic kidney disease: Secondary | ICD-10-CM | POA: Diagnosis not present

## 2014-11-16 DIAGNOSIS — M109 Gout, unspecified: Secondary | ICD-10-CM | POA: Diagnosis not present

## 2014-11-17 ENCOUNTER — Encounter: Payer: Self-pay | Admitting: Internal Medicine

## 2014-11-17 NOTE — Patient Instructions (Addendum)
Take Zithromax Z-Pak for respiratory infection. Start Synthroid 0.05 mg daily. Have TSH rechecked in 2 months.

## 2014-11-24 ENCOUNTER — Encounter (HOSPITAL_COMMUNITY)
Admission: RE | Admit: 2014-11-24 | Discharge: 2014-11-24 | Disposition: A | Discharge status: Home / Self Care | Payer: Medicare Other | Source: Ambulatory Visit | Attending: Nephrology | Admitting: Nephrology

## 2014-11-24 DIAGNOSIS — N184 Chronic kidney disease, stage 4 (severe): Secondary | ICD-10-CM | POA: Diagnosis not present

## 2014-11-24 DIAGNOSIS — D638 Anemia in other chronic diseases classified elsewhere: Secondary | ICD-10-CM | POA: Insufficient documentation

## 2014-11-24 LAB — POCT HEMOGLOBIN-HEMACUE: Hemoglobin: 11.4 g/dL — ABNORMAL LOW (ref 13.0–17.0)

## 2014-11-24 LAB — IRON AND TIBC
Iron: 109 ug/dL (ref 42–165)
Saturation Ratios: 41 % (ref 20–55)
TIBC: 269 ug/dL (ref 215–435)
UIBC: 160 ug/dL (ref 125–400)

## 2014-11-24 LAB — FERRITIN: Ferritin: 261 ng/mL (ref 22–322)

## 2014-11-24 MED ORDER — DARBEPOETIN ALFA 100 MCG/0.5ML IJ SOSY
100.0000 ug | PREFILLED_SYRINGE | INTRAMUSCULAR | Status: DC
  Administered 2014-11-24: 100 ug via SUBCUTANEOUS

## 2014-11-25 ENCOUNTER — Encounter: Payer: Self-pay | Admitting: Internal Medicine

## 2014-11-25 ENCOUNTER — Ambulatory Visit (INDEPENDENT_AMBULATORY_CARE_PROVIDER_SITE_OTHER): Payer: Medicare Other | Admitting: Internal Medicine

## 2014-11-25 VITALS — BP 118/64 | HR 60 | Temp 97.9°F

## 2014-11-25 DIAGNOSIS — E538 Deficiency of other specified B group vitamins: Secondary | ICD-10-CM | POA: Diagnosis not present

## 2014-11-25 MED ORDER — CYANOCOBALAMIN 1000 MCG/ML IJ SOLN
1000.0000 ug | Freq: Once | INTRAMUSCULAR | Status: AC
  Administered 2014-11-25: 1000 ug via INTRAMUSCULAR

## 2014-11-25 MED ORDER — DARBEPOETIN ALFA 100 MCG/0.5ML IJ SOSY
PREFILLED_SYRINGE | INTRAMUSCULAR | Status: AC
  Filled 2014-11-25: qty 0.5

## 2014-11-25 NOTE — Progress Notes (Signed)
Patient presents today for B12 injection. Patient VS stable. Patient tolerated well.

## 2014-12-08 ENCOUNTER — Encounter (HOSPITAL_COMMUNITY)
Admission: RE | Admit: 2014-12-08 | Discharge: 2014-12-08 | Disposition: A | Discharge status: Home / Self Care | Payer: Medicare Other | Source: Ambulatory Visit | Attending: Nephrology | Admitting: Nephrology

## 2014-12-08 DIAGNOSIS — N184 Chronic kidney disease, stage 4 (severe): Secondary | ICD-10-CM | POA: Diagnosis not present

## 2014-12-08 DIAGNOSIS — D638 Anemia in other chronic diseases classified elsewhere: Secondary | ICD-10-CM | POA: Diagnosis not present

## 2014-12-08 LAB — POCT HEMOGLOBIN-HEMACUE: Hemoglobin: 11.2 g/dL — ABNORMAL LOW (ref 13.0–17.0)

## 2014-12-08 MED ORDER — DARBEPOETIN ALFA 100 MCG/0.5ML IJ SOSY
100.0000 ug | PREFILLED_SYRINGE | INTRAMUSCULAR | Status: DC
  Administered 2014-12-08: 100 ug via SUBCUTANEOUS

## 2014-12-09 MED ORDER — DARBEPOETIN ALFA 100 MCG/0.5ML IJ SOSY
PREFILLED_SYRINGE | INTRAMUSCULAR | Status: AC
  Administered 2014-12-08: 100 ug via SUBCUTANEOUS
  Filled 2014-12-09: qty 0.5

## 2014-12-15 ENCOUNTER — Institutional Professional Consult (permissible substitution): Payer: Medicare Other | Admitting: Pulmonary Disease

## 2014-12-22 ENCOUNTER — Encounter (HOSPITAL_COMMUNITY)
Admission: RE | Admit: 2014-12-22 | Discharge: 2014-12-22 | Disposition: A | Discharge status: Home / Self Care | Payer: Medicare Other | Source: Ambulatory Visit | Attending: Nephrology | Admitting: Nephrology

## 2014-12-22 DIAGNOSIS — D638 Anemia in other chronic diseases classified elsewhere: Secondary | ICD-10-CM | POA: Diagnosis not present

## 2014-12-22 DIAGNOSIS — N184 Chronic kidney disease, stage 4 (severe): Secondary | ICD-10-CM | POA: Diagnosis not present

## 2014-12-22 LAB — IRON AND TIBC
Iron: 102 ug/dL (ref 42–165)
Saturation Ratios: 38 % (ref 20–55)
TIBC: 270 ug/dL (ref 215–435)
UIBC: 168 ug/dL (ref 125–400)

## 2014-12-22 LAB — POCT HEMOGLOBIN-HEMACUE: Hemoglobin: 11.7 g/dL — ABNORMAL LOW (ref 13.0–17.0)

## 2014-12-22 LAB — FERRITIN: Ferritin: 112 ng/mL (ref 22–322)

## 2014-12-22 MED ORDER — DARBEPOETIN ALFA 100 MCG/0.5ML IJ SOSY
100.0000 ug | PREFILLED_SYRINGE | INTRAMUSCULAR | Status: DC
  Administered 2014-12-22: 100 ug via SUBCUTANEOUS

## 2014-12-22 MED ORDER — DARBEPOETIN ALFA 100 MCG/0.5ML IJ SOSY
PREFILLED_SYRINGE | INTRAMUSCULAR | Status: AC
  Filled 2014-12-22: qty 0.5

## 2014-12-30 ENCOUNTER — Ambulatory Visit (INDEPENDENT_AMBULATORY_CARE_PROVIDER_SITE_OTHER): Payer: Medicare Other | Admitting: Internal Medicine

## 2014-12-30 VITALS — BP 110/60 | HR 58 | Temp 98.0°F

## 2014-12-30 DIAGNOSIS — E538 Deficiency of other specified B group vitamins: Secondary | ICD-10-CM

## 2014-12-30 MED ORDER — CYANOCOBALAMIN 1000 MCG/ML IJ SOLN
1000.0000 ug | Freq: Once | INTRAMUSCULAR | Status: AC
  Administered 2014-12-30: 1000 ug via INTRAMUSCULAR

## 2014-12-30 NOTE — Progress Notes (Signed)
Patient presents today for  B12 Injection. VS stable . Patient tolerated injection well.

## 2015-01-05 ENCOUNTER — Encounter (HOSPITAL_COMMUNITY)
Admission: RE | Admit: 2015-01-05 | Discharge: 2015-01-05 | Disposition: A | Discharge status: Home / Self Care | Payer: Medicare Other | Source: Ambulatory Visit | Attending: Nephrology | Admitting: Nephrology

## 2015-01-05 DIAGNOSIS — N184 Chronic kidney disease, stage 4 (severe): Secondary | ICD-10-CM | POA: Diagnosis not present

## 2015-01-05 DIAGNOSIS — D638 Anemia in other chronic diseases classified elsewhere: Secondary | ICD-10-CM | POA: Diagnosis not present

## 2015-01-05 LAB — POCT HEMOGLOBIN-HEMACUE: Hemoglobin: 11.6 g/dL — ABNORMAL LOW (ref 13.0–17.0)

## 2015-01-05 MED ORDER — DARBEPOETIN ALFA 100 MCG/0.5ML IJ SOSY
PREFILLED_SYRINGE | INTRAMUSCULAR | Status: AC
  Filled 2015-01-05: qty 0.5

## 2015-01-05 MED ORDER — DARBEPOETIN ALFA 100 MCG/0.5ML IJ SOSY
100.0000 ug | PREFILLED_SYRINGE | INTRAMUSCULAR | Status: DC
  Administered 2015-01-05: 100 ug via SUBCUTANEOUS

## 2015-01-19 ENCOUNTER — Encounter (HOSPITAL_COMMUNITY)
Admission: RE | Admit: 2015-01-19 | Discharge: 2015-01-19 | Disposition: A | Discharge status: Home / Self Care | Payer: Medicare Other | Source: Ambulatory Visit | Attending: Nephrology | Admitting: Nephrology

## 2015-01-19 DIAGNOSIS — N184 Chronic kidney disease, stage 4 (severe): Secondary | ICD-10-CM | POA: Diagnosis not present

## 2015-01-19 DIAGNOSIS — D638 Anemia in other chronic diseases classified elsewhere: Secondary | ICD-10-CM | POA: Diagnosis not present

## 2015-01-19 LAB — POCT HEMOGLOBIN-HEMACUE: Hemoglobin: 11.6 g/dL — ABNORMAL LOW (ref 13.0–17.0)

## 2015-01-19 MED ORDER — DARBEPOETIN ALFA 100 MCG/0.5ML IJ SOSY
PREFILLED_SYRINGE | INTRAMUSCULAR | Status: AC
  Filled 2015-01-19: qty 0.5

## 2015-01-19 MED ORDER — DARBEPOETIN ALFA 100 MCG/0.5ML IJ SOSY
100.0000 ug | PREFILLED_SYRINGE | INTRAMUSCULAR | Status: DC
  Administered 2015-01-19: 100 ug via SUBCUTANEOUS

## 2015-01-20 LAB — IRON AND TIBC
Iron: 78 ug/dL (ref 42–165)
Saturation Ratios: 36 % (ref 20–55)
TIBC: 215 ug/dL (ref 215–435)
UIBC: 137 ug/dL (ref 125–400)

## 2015-01-20 LAB — FERRITIN: Ferritin: 129 ng/mL (ref 22–322)

## 2015-02-01 ENCOUNTER — Institutional Professional Consult (permissible substitution): Payer: Medicare Other | Admitting: Pulmonary Disease

## 2015-02-02 ENCOUNTER — Encounter: Payer: Self-pay | Admitting: Pulmonary Disease

## 2015-02-02 ENCOUNTER — Ambulatory Visit (INDEPENDENT_AMBULATORY_CARE_PROVIDER_SITE_OTHER): Payer: Medicare Other | Admitting: Pulmonary Disease

## 2015-02-02 ENCOUNTER — Encounter (HOSPITAL_COMMUNITY): Payer: TRICARE For Life (TFL)

## 2015-02-02 VITALS — BP 130/62 | HR 56 | Ht 71.0 in | Wt 234.2 lb

## 2015-02-02 DIAGNOSIS — G4733 Obstructive sleep apnea (adult) (pediatric): Secondary | ICD-10-CM | POA: Diagnosis not present

## 2015-02-02 NOTE — Progress Notes (Signed)
Subjective:    Patient ID: [name], male    DOB: 09/01/1941, 74 y.o.   MRN: [id number]  HPI  Chief Complaint  Patient presents with  . SLEEP CONSULT    Referred by Dr. Lorrene Reid; Patient has trouble falling asleep, wakes up a lot to go to bathroom (patient has kidney failure and will soon begin Dialysis). Patient says he wakes up in the mornings feeling like he hasn't slept at all. Fatigue. 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 AV fistula has been placed in his right arm and dialysis is to be started soon. He reports onset of CK D after an episode of aortic dissection in 1991. He has been diabetic for 20 years-lately has noted low sugars. He reports night sweats that wake him up from sleep. Epworth sleepiness score is 9 Bedtime is 11 to 11:30 PM, takes him a long time to get settled, partly due to wake symptoms of sweating and palpitations, sleep latency is prolonged, he reports 7-8 nocturnal awakenings for nocturia. He takes Lasix in the morning and then again around 9 PM. He works at the daytime at an [employer]. He is out of bed by 7 AM feeling tired with occasional dryness of mouth. On his days off, he stays in bed until 11 AM. He is gained about 17 pounds in the past 2 years. He quit smoking in 1996-about 20 pack years, He denies creepy crawly sensation in his legs towards evenings, or leg discomfort or cramps   Past Medical History  Diagnosis Date  . Hypertension   . Thyroid disease   . Renal insufficiency   . Hyperlipidemia   . ED (erectile dysfunction)   . Allergy   . Elevated homocysteine   . PVD (peripheral vascular disease)     has plastic aorta  . Arthritis   . Diverticulitis   . Seasonal allergies   . Hypothyroidism   . GERD (gastroesophageal reflux disease)     pepto   . Gout   . Diabetes mellitus     Type 2  . Pneumonia   . Anemia      Past Surgical History  Procedure Laterality Date  . Aortobifemoral bypass    . Breast surgery      left - granulomatous mastitis  . Sbo with lysis adhesions    . Eye surgery Bilateral     cataracts  . Colonoscopy    . Av fistula placement Left 12/01/2013    Procedure: ARTERIOVENOUS (AV) FISTULA CREATION- LEFT BRACHIOCEPHALIC;  Surgeon: Angelia Mould, MD;  Location: [place];  Service: Vascular;  Laterality: Left;  . Revison of arteriovenous fistula Left 02/09/2014    Procedure: REVISON OF LEFT ARTERIOVENOUS FISTULA - RESECTION OF RENDUNDANT VEIN;  Surgeon: Angelia Mould, MD;  Location: [place];  Service: Vascular;  Laterality: Left;  . Bascilic vein transposition Right 07/27/2014    Procedure: BASCILIC VEIN TRANSPOSITION;  Surgeon: Angelia Mould, MD;  Location: [place];  Service: Vascular;  Laterality: Right;  . Shuntogram Left 04/19/2014    Procedure: FISTULOGRAM;  Surgeon: Angelia Mould, MD;  Location: [facility] CATH LAB;  Service: Cardiovascular;  Laterality: Left;  . Unilateral upper extremeity angiogram N/A 07/12/2014    Procedure: UNILATERAL UPPER Anselmo Rod;  Surgeon: Angelia Mould, MD;  Location: [facility] CATH LAB;  Service: Cardiovascular;  Laterality: N/A;    Allergies  Allergen Reactions  .  Penicillins Rash    History   Social History  . Marital Status: Married    Spouse Name: N/A  . Number of Children: N/A  . Years of Education: N/A   Occupational History  . Not on file.   Social History Main Topics  . Smoking status: Former Smoker    Quit date: 09/24/1994  . Smokeless tobacco: Never Used  . Alcohol Use: Yes     Comment: Quit Oct. 1977 ("somewhat heavy")  . Drug Use: No  . Sexual Activity: Not on file   Other Topics Concern  . Not on file   Social History Narrative    Family History  Problem Relation Age of Onset  . Aneurysm Mother   . Heart disease Father   . Stroke Father   . Hypertension Father   . Diabetes  Father     Review of Systems  Constitutional: Negative for fever, chills, activity change, appetite change and unexpected weight change.  HENT: Negative for congestion, dental problem, postnasal drip, rhinorrhea, sneezing, sore throat, trouble swallowing and voice change.   Eyes: Negative for visual disturbance.  Respiratory: Negative for cough, choking and shortness of breath.   Cardiovascular: Negative for chest pain and leg swelling.  Gastrointestinal: Negative for nausea, vomiting and abdominal pain.  Genitourinary: Negative for difficulty urinating.  Musculoskeletal: Negative for arthralgias.  Skin: Negative for rash.  Psychiatric/Behavioral: Negative for behavioral problems and confusion.       Objective:   Physical Exam  Gen. Pleasant, well-nourished, in no distress, normal affect ENT - no lesions, no post nasal drip Neck: No JVD, no thyromegaly, no carotid bruits Lungs: no use of accessory muscles, no dullness to percussion, clear without rales or rhonchi  Cardiovascular: Rhythm regular, heart sounds  normal, no murmurs or gallops, no peripheral edema Abdomen: soft and non-tender, no hepatosplenomegaly, BS normal. Musculoskeletal: No deformities, no cyanosis or clubbing Neuro:  alert, non focal        Assessment & Plan:

## 2015-02-02 NOTE — Patient Instructions (Addendum)
You may be having low sugars in your sleep Decrease GLUCOTROL to 1/2 tab (5mg  ) twice daily Home sleep study -schedule for a month from now

## 2015-02-02 NOTE — Assessment & Plan Note (Addendum)
He does not seem to have restless legs-his insomnia is of recent onset. I wonder if his awakenings are due to nocturnal hypoglycemia. This may have been worsened recently due to worsening renal failure. Decrease GLUCOTROL to 1/2 tab (5mg  ) twice daily To rule out the possibility of sleep-disordered breathing will proceed with Home sleep study -schedule for a month from now  Given excessive daytime somnolence, narrow pharyngeal exam, witnessed apneas & loud snoring, obstructive sleep apnea is very likely & an overnight polysomnogram will be scheduled as a home study. The pathophysiology of obstructive sleep apnea , it's cardiovascular consequences & modes of treatment including CPAP were discused with the patient in detail & they evidenced understanding.

## 2015-02-11 ENCOUNTER — Other Ambulatory Visit: Payer: Medicare Other | Admitting: Internal Medicine

## 2015-02-11 DIAGNOSIS — E039 Hypothyroidism, unspecified: Secondary | ICD-10-CM

## 2015-02-11 DIAGNOSIS — R5383 Other fatigue: Secondary | ICD-10-CM

## 2015-02-11 DIAGNOSIS — Z125 Encounter for screening for malignant neoplasm of prostate: Secondary | ICD-10-CM

## 2015-02-11 DIAGNOSIS — E119 Type 2 diabetes mellitus without complications: Secondary | ICD-10-CM

## 2015-02-11 DIAGNOSIS — E785 Hyperlipidemia, unspecified: Secondary | ICD-10-CM

## 2015-02-11 DIAGNOSIS — E875 Hyperkalemia: Secondary | ICD-10-CM

## 2015-02-11 NOTE — Progress Notes (Signed)
   Subjective:    Patient ID: [name], male    DOB: 01-28-1941, 74 y.o.   MRN: [id number]  HPI  Called patient-- forgot to come for labs will get done Monday    Review of Systems     Objective:   Physical Exam        Assessment & Plan:

## 2015-02-14 ENCOUNTER — Other Ambulatory Visit: Payer: Self-pay | Admitting: Internal Medicine

## 2015-02-14 ENCOUNTER — Ambulatory Visit (INDEPENDENT_AMBULATORY_CARE_PROVIDER_SITE_OTHER): Payer: Medicare Other | Admitting: Internal Medicine

## 2015-02-14 ENCOUNTER — Encounter: Payer: Self-pay | Admitting: Internal Medicine

## 2015-02-14 VITALS — BP 156/68 | HR 58 | Temp 97.4°F | Ht 71.0 in | Wt 235.0 lb

## 2015-02-14 DIAGNOSIS — Z125 Encounter for screening for malignant neoplasm of prostate: Secondary | ICD-10-CM | POA: Diagnosis not present

## 2015-02-14 DIAGNOSIS — L219 Seborrheic dermatitis, unspecified: Secondary | ICD-10-CM

## 2015-02-14 DIAGNOSIS — L218 Other seborrheic dermatitis: Secondary | ICD-10-CM

## 2015-02-14 DIAGNOSIS — E785 Hyperlipidemia, unspecified: Secondary | ICD-10-CM | POA: Diagnosis not present

## 2015-02-14 DIAGNOSIS — R5383 Other fatigue: Secondary | ICD-10-CM | POA: Diagnosis not present

## 2015-02-14 DIAGNOSIS — Z79899 Other long term (current) drug therapy: Secondary | ICD-10-CM | POA: Diagnosis not present

## 2015-02-14 DIAGNOSIS — N185 Chronic kidney disease, stage 5: Secondary | ICD-10-CM

## 2015-02-14 DIAGNOSIS — N529 Male erectile dysfunction, unspecified: Secondary | ICD-10-CM

## 2015-02-14 DIAGNOSIS — Z0000 Encounter for general adult medical examination without abnormal findings: Secondary | ICD-10-CM

## 2015-02-14 DIAGNOSIS — Z8719 Personal history of other diseases of the digestive system: Secondary | ICD-10-CM

## 2015-02-14 DIAGNOSIS — N2581 Secondary hyperparathyroidism of renal origin: Secondary | ICD-10-CM

## 2015-02-14 DIAGNOSIS — I10 Essential (primary) hypertension: Secondary | ICD-10-CM | POA: Diagnosis not present

## 2015-02-14 DIAGNOSIS — D649 Anemia, unspecified: Secondary | ICD-10-CM | POA: Diagnosis not present

## 2015-02-14 DIAGNOSIS — E538 Deficiency of other specified B group vitamins: Secondary | ICD-10-CM | POA: Diagnosis not present

## 2015-02-14 DIAGNOSIS — E119 Type 2 diabetes mellitus without complications: Secondary | ICD-10-CM

## 2015-02-14 DIAGNOSIS — I70209 Unspecified atherosclerosis of native arteries of extremities, unspecified extremity: Secondary | ICD-10-CM | POA: Diagnosis not present

## 2015-02-14 DIAGNOSIS — J309 Allergic rhinitis, unspecified: Secondary | ICD-10-CM

## 2015-02-14 DIAGNOSIS — Z8639 Personal history of other endocrine, nutritional and metabolic disease: Secondary | ICD-10-CM

## 2015-02-14 DIAGNOSIS — Z8739 Personal history of other diseases of the musculoskeletal system and connective tissue: Secondary | ICD-10-CM

## 2015-02-14 DIAGNOSIS — E039 Hypothyroidism, unspecified: Secondary | ICD-10-CM | POA: Diagnosis not present

## 2015-02-14 LAB — COMPLETE METABOLIC PANEL WITHOUT GFR
ALT: 9 U/L (ref 0–53)
AST: 13 U/L (ref 0–37)
Albumin: 3.4 g/dL — ABNORMAL LOW (ref 3.5–5.2)
Alkaline Phosphatase: 40 U/L (ref 39–117)
BUN: 74 mg/dL — ABNORMAL HIGH (ref 6–23)
CO2: 19 meq/L (ref 19–32)
Calcium: 9.2 mg/dL (ref 8.4–10.5)
Chloride: 109 meq/L (ref 96–112)
Creat: 6.32 mg/dL — ABNORMAL HIGH (ref 0.50–1.35)
GFR, Est African American: 9 mL/min — ABNORMAL LOW
GFR, Est Non African American: 8 mL/min — ABNORMAL LOW
Glucose, Bld: 82 mg/dL (ref 70–99)
Potassium: 4.8 meq/L (ref 3.5–5.3)
Sodium: 141 meq/L (ref 135–145)
Total Bilirubin: 0.3 mg/dL (ref 0.2–1.2)
Total Protein: 5.3 g/dL — ABNORMAL LOW (ref 6.0–8.3)

## 2015-02-14 LAB — POCT URINALYSIS DIPSTICK
Bilirubin, UA: NEGATIVE
Glucose, UA: NEGATIVE
Ketones, UA: NEGATIVE
Leukocytes, UA: NEGATIVE
Nitrite, UA: NEGATIVE
Spec Grav, UA: 1.02
Urobilinogen, UA: NEGATIVE
pH, UA: 6.5

## 2015-02-14 LAB — LIPID PANEL
Cholesterol: 83 mg/dL (ref 0–200)
HDL: 42 mg/dL (ref >=40)
LDL Cholesterol: 31 mg/dL (ref 0–99)
Total CHOL/HDL Ratio: 2 Ratio
Triglycerides: 51 mg/dL (ref <150)
VLDL: 10 mg/dL (ref 0–40)

## 2015-02-14 LAB — CBC WITH DIFFERENTIAL/PLATELET
Basophils Absolute: 0.1 10*3/uL (ref 0.0–0.1)
Basophils Relative: 1 % (ref 0–1)
Eosinophils Absolute: 0.4 10*3/uL (ref 0.0–0.7)
Eosinophils Relative: 6 % — ABNORMAL HIGH (ref 0–5)
HCT: 31.2 % — ABNORMAL LOW (ref 39.0–52.0)
Hemoglobin: 10 g/dL — ABNORMAL LOW (ref 13.0–17.0)
Lymphocytes Relative: 30 % (ref 12–46)
Lymphs Abs: 1.8 10*3/uL (ref 0.7–4.0)
MCH: 32.9 pg (ref 26.0–34.0)
MCHC: 32.1 g/dL (ref 30.0–36.0)
MCV: 102.6 fL — ABNORMAL HIGH (ref 78.0–100.0)
MPV: 11.2 fL (ref 8.6–12.4)
Monocytes Absolute: 0.5 10*3/uL (ref 0.1–1.0)
Monocytes Relative: 8 % (ref 3–12)
Neutro Abs: 3.2 10*3/uL (ref 1.7–7.7)
Neutrophils Relative %: 55 % (ref 43–77)
Platelets: 142 10*3/uL — ABNORMAL LOW (ref 150–400)
RBC: 3.04 MIL/uL — ABNORMAL LOW (ref 4.22–5.81)
RDW: 15.9 % — ABNORMAL HIGH (ref 11.5–15.5)
WBC: 5.9 10*3/uL (ref 4.0–10.5)

## 2015-02-14 LAB — HEMOGLOBIN A1C
Hgb A1c MFr Bld: 6.2 % — ABNORMAL HIGH (ref <5.7)
Mean Plasma Glucose: 131 mg/dL — ABNORMAL HIGH (ref <117)

## 2015-02-14 LAB — TSH: TSH: 3.187 u[IU]/mL (ref 0.350–4.500)

## 2015-02-14 MED ORDER — CLOBETASOL PROPIONATE 0.05 % EX OINT: 1.0000 "application " | TOPICAL_OINTMENT | Freq: Two times a day (BID) | CUTANEOUS | Status: DC

## 2015-02-14 MED ORDER — MUPIROCIN 2 % EX OINT: TOPICAL_OINTMENT | CUTANEOUS | Status: DC

## 2015-02-14 NOTE — Patient Instructions (Addendum)
Use Bactroban on legs twice a day. Return in a few days for blood pressure check. Continue same medications. May need to see dermatologist regarding dermatitis. Be sure and take blood pressure medication before coming to office.

## 2015-02-14 NOTE — Progress Notes (Signed)
Subjective:    Patient ID: [name], male    DOB: 29-Jul-1941, 74 y.o.   MRN: [id number]  HPI  74 year old Black Male with sleep issues to have sleep study in the near future. He is here today for medicare wellness physical exam and evaluation of medical issues.Had eye exam with Dr. Satira Sark. Got Prevnar at [place] in [place].  Has end-stage kidney disease, diabetes mellitus, hypertension, secondary hyperparathyroidism, peripheral vascular disease. 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 History of gout, hyperlipidemia, erectile dysfunction, allergic rhinitis, hypothyroidism.  He is followed by Dr. Lorrene Reid, nephrologist. Also is seen at the [facility]. He's been followed by nephrology since 1992. Baseline creatinine initially was round 2.0 been increased to 2.45 in July 2011. Subsequently increased to 3.80 in 2015. At that point a shunt was placed in his left arm.  Had Cardiolite study done by Dr. Pernell Dupre in 2003 that was negative for ischemia. He sees Dr. Karsten Ro, urologist for erectile dysfunction he checks his prostate.  History of shingles 2007. Pneumovax and is a she January 2011. Had mastectomy for granulomatous mastitis left breast. History of left-sided abdominal pain from time to time consistent with diverticulitis and response to Cipro and Flagyl. He had a small bowel obstruction treated with surgical lysis of adhesions in 1996. Last hemoglobin A1c was 6.7% in 2008. Worst I've seen is 7.6% in April 2009.  Social history: He works part-time at [employer]. Married. Wife works for the [employer], [employer]. They have one adopted daughter in her 61s. Nonsmoker. Social alcohol consumption.  Family history: Father died of congestive heart failure at age 17. Mother died at age 45 of brain aneurysm. He is an only child.       Review of Systems  Constitutional: Positive for fatigue.  HENT: Positive for rhinorrhea.   Eyes:  Negative.        Diabetic eye exam about 4 months ago with Dr. Satira Sark  Respiratory: Positive for wheezing.   Cardiovascular: Negative.   Gastrointestinal: Negative.   Genitourinary: Negative.   Allergic/Immunologic: Positive for environmental allergies.  Hematological: Negative.   Psychiatric/Behavioral:       Feels hot under arms at night cannot fall asleep.       Objective:   Physical Exam  Constitutional: He appears well-developed and well-nourished. No distress.  HENT:  Head: Normocephalic and atraumatic.  Right Ear: External ear normal.  Left Ear: External ear normal.  Mouth/Throat: Oropharynx is clear and moist. No oropharyngeal exudate.  Eyes: Conjunctivae and EOM are normal. Pupils are equal, round, and reactive to light. Right eye exhibits no discharge.  Neck: Neck supple. No JVD present. No thyromegaly present.  Cardiovascular: Normal rate, regular rhythm and normal heart sounds.   No murmur heard. Abdominal: Soft. Bowel sounds are normal.  Genitourinary: Prostate normal.  Lymphadenopathy:    He has no cervical adenopathy.  Neurological:  Trace LE edema  Skin: Skin is warm and dry. He is not diaphoretic.  Dermatitis lower extremities  Psychiatric: He has a normal mood and affect. His behavior is normal. Judgment and thought content normal.  Vitals reviewed.         Assessment & Plan:   Hypertension-history of end-stage kidney disease. Is not taking anti-hypertensive medication before coming to office today. Will need to return in a few days after he had his blood pressure medication before coming to the office.  End-stage kidney disease followed by nephrologist  Type  2 diabetes mellitus  Allergic rhinitis  Secondary hyperparathyroidism  Hypothyroidism  Hyperlipidemia  History of diverticulitis  Atherosclerotic peripheral vascular disease  Obstructive sleep apnea  B 12 deficiency  Erectile dysfunction  Scalp dermatitis  Rosacea   Plan:  He will need return in a few days and was instructed to take blood pressure medication prior to coming to office for blood pressure check. Continued see pulmonologist and nephrologist. Monthly B 12 injections done through this office.  He is having chronic issues with his scalp and face and will need to see dermatologist. Appears to have seborrhea and perhaps rosacea                Subjective:   Patient presents for Medicare Annual/Subsequent preventive examination.  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 No  Over the past two weeks, have you felt little interest or pleasure in doing things? No Have you lost interest or pleasure in daily life? No Do you often feel hopeless? No Do you cry easily over simple problems? No   Activities of Daily Living  In your present state of health, do you have any difficulty performing the following activities?:   Driving? No  Managing money? No  Feeding yourself? No  Getting from bed to chair? No  Climbing a flight of stairs? No  Preparing food and eating?: No  Bathing or showering? No  Getting dressed: No  Getting to the toilet? No  Using the toilet:No  Moving around from place to place: No  In the past year have you fallen or had a near fall?:No  Are you sexually active? No  Do you have more than one partner? No   Hearing Difficulties: No  Do you often ask people to speak up or repeat themselves? No  Do you experience ringing or noises in your ears? Sometimes Do you have  difficulty understanding soft or whispered voices? No  Do you feel that you have a problem with memory? No Do you often misplace items? Occasionally   Home Safety:  Do you have a smoke alarm at your residence? Yes Do you have grab bars in the bathroom? No Do you have throw rugs in your house? Yes   Cognitive Testing  Alert? Yes Normal Appearance?Yes  Oriented to person? Yes Place? Yes  Time? Yes  Recall of three objects? Yes  Can perform simple calculations? Yes  Displays appropriate judgment?Yes  Can read the correct time from a watch face?Yes   List the Names of Other Physician/Practitioners you currently use:  See referral list for the physicians patient is currently seeing.  Dr. Barbra Sarks  Physician at [facility]  Dr. Carolynn Comment   Review of Systems: see above   Objective:     General appearance: Appears stated age and mildly obese  Head: Normocephalic, without obvious abnormality, atraumatic  Eyes: conj clear, EOMi PEERLA  Ears: normal TM's and external ear canals both ears  Nose: Nares normal. Septum midline. Mucosa normal. No drainage or sinus tenderness.  Throat: lips, mucosa, and tongue normal; teeth and gums normal  Neck: no adenopathy, no carotid bruit, no JVD, supple, symmetrical, trachea midline and thyroid not enlarged, symmetric, no tenderness/mass/nodules  No CVA tenderness.  Lungs: clear to auscultation bilaterally  Breasts: normal appearance, no masses or tenderness Heart: regular rate and rhythm, S1, S2 normal, no murmur, click, rub or gallop  Abdomen: soft, non-tender; bowel sounds normal; no masses, no organomegaly  Musculoskeletal: ROM normal in all joints, no crepitus, no deformity, Normal muscle strengthen. Back  is symmetric, no curvature. Skin: Skin color, texture, turgor normal. No rashes or lesions  Lymph nodes: Cervical, supraclavicular, and axillary nodes normal.  Neurologic: CN 2 -12 Normal, Normal symmetric  reflexes. Normal coordination and gait  Psych: Alert & Oriented x 3, Mood appear stable.    Assessment:    Annual wellness medicare exam   Plan:    During the course of the visit the patient was educated and counseled about appropriate screening and preventive services including:   Podiatrist sees him at [place] every 3 months  Has had Zostavax vaccine and Prevnar at the [place]  Had eye exam February 2016 by Dr. Satira Sark  PSA is pending. Says urologist at [facility] told him just to get checked here annually     Patient Instructions (the written plan) was given to the patient.  Medicare Attestation  I have personally reviewed:  The patient's medical and social history  Their use of alcohol, tobacco or illicit drugs  Their current medications and supplements  The patient's functional ability including ADLs,fall risks, home safety risks, cognitive, and hearing and visual impairment  Diet and physical activities  Evidence for depression or mood disorders  The patient's weight, height, BMI, and visual acuity have been recorded in the chart. I have made referrals, counseling, and provided education to the patient based on review of the above and I have provided the patient with a written personalized care plan for preventive services.

## 2015-02-15 ENCOUNTER — Encounter (HOSPITAL_COMMUNITY)
Admission: RE | Admit: 2015-02-15 | Discharge: 2015-02-15 | Disposition: A | Discharge status: Home / Self Care | Payer: Medicare Other | Source: Ambulatory Visit | Attending: Nephrology | Admitting: Nephrology

## 2015-02-15 DIAGNOSIS — Z79899 Other long term (current) drug therapy: Secondary | ICD-10-CM | POA: Diagnosis not present

## 2015-02-15 DIAGNOSIS — N184 Chronic kidney disease, stage 4 (severe): Secondary | ICD-10-CM | POA: Diagnosis present

## 2015-02-15 DIAGNOSIS — D631 Anemia in chronic kidney disease: Secondary | ICD-10-CM | POA: Diagnosis not present

## 2015-02-15 DIAGNOSIS — Z5181 Encounter for therapeutic drug level monitoring: Secondary | ICD-10-CM | POA: Diagnosis not present

## 2015-02-15 LAB — IRON AND TIBC
Iron: 65 ug/dL (ref 45–182)
Saturation Ratios: 25 % (ref 17.9–39.5)
TIBC: 265 ug/dL (ref 250–450)
UIBC: 200 ug/dL

## 2015-02-15 LAB — B12 AND FOLATE PANEL
Folate: 8.7 ng/mL
Vitamin B-12: 835 pg/mL (ref 211–911)

## 2015-02-15 LAB — POCT HEMOGLOBIN-HEMACUE: Hemoglobin: 8.7 g/dL — ABNORMAL LOW (ref 13.0–17.0)

## 2015-02-15 LAB — FERRITIN: Ferritin: 99 ng/mL (ref 24–336)

## 2015-02-15 LAB — MICROALBUMIN / CREATININE URINE RATIO
Creatinine, Urine: 81.3 mg/dL
Microalb Creat Ratio: 4364.1 mg/g — ABNORMAL HIGH (ref 0.0–30.0)
Microalb, Ur: 354.8 mg/dL — ABNORMAL HIGH (ref <2.0)

## 2015-02-15 LAB — PSA, MEDICARE: PSA: 0.26 ng/mL (ref <=4.00)

## 2015-02-15 MED ORDER — DARBEPOETIN ALFA 100 MCG/0.5ML IJ SOSY
PREFILLED_SYRINGE | INTRAMUSCULAR | Status: AC
  Filled 2015-02-15: qty 0.5

## 2015-02-15 MED ORDER — DARBEPOETIN ALFA 100 MCG/0.5ML IJ SOSY
100.0000 ug | PREFILLED_SYRINGE | INTRAMUSCULAR | Status: DC
  Administered 2015-02-15: 100 ug via SUBCUTANEOUS

## 2015-02-17 ENCOUNTER — Ambulatory Visit (INDEPENDENT_AMBULATORY_CARE_PROVIDER_SITE_OTHER): Payer: Medicare Other | Admitting: Internal Medicine

## 2015-02-17 VITALS — BP 160/58 | HR 58

## 2015-02-17 DIAGNOSIS — E538 Deficiency of other specified B group vitamins: Secondary | ICD-10-CM | POA: Diagnosis not present

## 2015-02-17 DIAGNOSIS — I10 Essential (primary) hypertension: Secondary | ICD-10-CM | POA: Diagnosis not present

## 2015-02-17 DIAGNOSIS — N184 Chronic kidney disease, stage 4 (severe): Secondary | ICD-10-CM

## 2015-02-17 MED ORDER — CYANOCOBALAMIN 1000 MCG/ML IJ SOLN
1000.0000 ug | Freq: Once | INTRAMUSCULAR | Status: AC
  Administered 2015-02-17: 1000 ug via INTRAMUSCULAR

## 2015-02-17 NOTE — Progress Notes (Signed)
Patient presents today for B12 injection. Tolerated injection well

## 2015-02-18 ENCOUNTER — Emergency Department (HOSPITAL_COMMUNITY): Payer: Medicare Other

## 2015-02-18 ENCOUNTER — Ambulatory Visit (INDEPENDENT_AMBULATORY_CARE_PROVIDER_SITE_OTHER): Payer: Medicare Other | Admitting: Internal Medicine

## 2015-02-18 ENCOUNTER — Encounter (HOSPITAL_COMMUNITY): Payer: Self-pay | Admitting: *Deleted

## 2015-02-18 ENCOUNTER — Inpatient Hospital Stay (HOSPITAL_COMMUNITY)
Admission: EM | Admit: 2015-02-18 | Discharge: 2015-02-24 | DRG: 682 | Disposition: A | Discharge status: Home / Self Care | Payer: Medicare Other | Attending: Internal Medicine | Admitting: Internal Medicine

## 2015-02-18 VITALS — BP 126/56 | HR 58

## 2015-02-18 DIAGNOSIS — G4700 Insomnia, unspecified: Secondary | ICD-10-CM | POA: Diagnosis not present

## 2015-02-18 DIAGNOSIS — E872 Acidosis: Secondary | ICD-10-CM | POA: Diagnosis present

## 2015-02-18 DIAGNOSIS — E785 Hyperlipidemia, unspecified: Secondary | ICD-10-CM | POA: Diagnosis present

## 2015-02-18 DIAGNOSIS — I10 Essential (primary) hypertension: Secondary | ICD-10-CM | POA: Diagnosis not present

## 2015-02-18 DIAGNOSIS — I517 Cardiomegaly: Secondary | ICD-10-CM | POA: Diagnosis present

## 2015-02-18 DIAGNOSIS — Z6841 Body Mass Index (BMI) 40.0 and over, adult: Secondary | ICD-10-CM | POA: Diagnosis not present

## 2015-02-18 DIAGNOSIS — D631 Anemia in chronic kidney disease: Secondary | ICD-10-CM | POA: Diagnosis present

## 2015-02-18 DIAGNOSIS — D696 Thrombocytopenia, unspecified: Secondary | ICD-10-CM | POA: Diagnosis present

## 2015-02-18 DIAGNOSIS — E039 Hypothyroidism, unspecified: Secondary | ICD-10-CM | POA: Diagnosis present

## 2015-02-18 DIAGNOSIS — M109 Gout, unspecified: Secondary | ICD-10-CM | POA: Diagnosis present

## 2015-02-18 DIAGNOSIS — R630 Anorexia: Secondary | ICD-10-CM | POA: Diagnosis present

## 2015-02-18 DIAGNOSIS — R0602 Shortness of breath: Secondary | ICD-10-CM | POA: Diagnosis present

## 2015-02-18 DIAGNOSIS — Z992 Dependence on renal dialysis: Secondary | ICD-10-CM | POA: Diagnosis not present

## 2015-02-18 DIAGNOSIS — R600 Localized edema: Secondary | ICD-10-CM | POA: Diagnosis present

## 2015-02-18 DIAGNOSIS — I120 Hypertensive chronic kidney disease with stage 5 chronic kidney disease or end stage renal disease: Principal | ICD-10-CM | POA: Diagnosis present

## 2015-02-18 DIAGNOSIS — Z79899 Other long term (current) drug therapy: Secondary | ICD-10-CM | POA: Diagnosis not present

## 2015-02-18 DIAGNOSIS — N186 End stage renal disease: Secondary | ICD-10-CM | POA: Diagnosis present

## 2015-02-18 DIAGNOSIS — E875 Hyperkalemia: Secondary | ICD-10-CM | POA: Diagnosis present

## 2015-02-18 DIAGNOSIS — E1122 Type 2 diabetes mellitus with diabetic chronic kidney disease: Secondary | ICD-10-CM | POA: Diagnosis present

## 2015-02-18 DIAGNOSIS — E8770 Fluid overload, unspecified: Secondary | ICD-10-CM | POA: Diagnosis present

## 2015-02-18 DIAGNOSIS — Z87891 Personal history of nicotine dependence: Secondary | ICD-10-CM | POA: Diagnosis not present

## 2015-02-18 DIAGNOSIS — N184 Chronic kidney disease, stage 4 (severe): Secondary | ICD-10-CM | POA: Diagnosis not present

## 2015-02-18 DIAGNOSIS — E11649 Type 2 diabetes mellitus with hypoglycemia without coma: Secondary | ICD-10-CM | POA: Diagnosis present

## 2015-02-18 DIAGNOSIS — R0600 Dyspnea, unspecified: Secondary | ICD-10-CM | POA: Diagnosis not present

## 2015-02-18 DIAGNOSIS — E162 Hypoglycemia, unspecified: Secondary | ICD-10-CM | POA: Diagnosis not present

## 2015-02-18 DIAGNOSIS — E1121 Type 2 diabetes mellitus with diabetic nephropathy: Secondary | ICD-10-CM | POA: Diagnosis present

## 2015-02-18 DIAGNOSIS — Z7982 Long term (current) use of aspirin: Secondary | ICD-10-CM | POA: Diagnosis not present

## 2015-02-18 DIAGNOSIS — R5383 Other fatigue: Secondary | ICD-10-CM | POA: Diagnosis not present

## 2015-02-18 DIAGNOSIS — Z880 Allergy status to penicillin: Secondary | ICD-10-CM

## 2015-02-18 DIAGNOSIS — I509 Heart failure, unspecified: Secondary | ICD-10-CM

## 2015-02-18 DIAGNOSIS — R531 Weakness: Secondary | ICD-10-CM | POA: Diagnosis not present

## 2015-02-18 DIAGNOSIS — E119 Type 2 diabetes mellitus without complications: Secondary | ICD-10-CM | POA: Diagnosis present

## 2015-02-18 DIAGNOSIS — R001 Bradycardia, unspecified: Secondary | ICD-10-CM | POA: Diagnosis present

## 2015-02-18 DIAGNOSIS — R05 Cough: Secondary | ICD-10-CM | POA: Diagnosis not present

## 2015-02-18 DIAGNOSIS — IMO0001 Reserved for inherently not codable concepts without codable children: Secondary | ICD-10-CM | POA: Diagnosis present

## 2015-02-18 DIAGNOSIS — I2781 Cor pulmonale (chronic): Secondary | ICD-10-CM | POA: Diagnosis present

## 2015-02-18 DIAGNOSIS — E118 Type 2 diabetes mellitus with unspecified complications: Secondary | ICD-10-CM | POA: Diagnosis not present

## 2015-02-18 DIAGNOSIS — Z794 Long term (current) use of insulin: Secondary | ICD-10-CM

## 2015-02-18 LAB — CBC
HCT: 27.4 % — ABNORMAL LOW (ref 39.0–52.0)
Hemoglobin: 8.9 g/dL — ABNORMAL LOW (ref 13.0–17.0)
MCH: 32.7 pg (ref 26.0–34.0)
MCHC: 32.5 g/dL (ref 30.0–36.0)
MCV: 100.7 fL — ABNORMAL HIGH (ref 78.0–100.0)
Platelets: 121 10*3/uL — ABNORMAL LOW (ref 150–400)
RBC: 2.72 MIL/uL — ABNORMAL LOW (ref 4.22–5.81)
RDW: 15.9 % — ABNORMAL HIGH (ref 11.5–15.5)
WBC: 5.2 10*3/uL (ref 4.0–10.5)

## 2015-02-18 LAB — CBG MONITORING, ED
Glucose-Capillary: 34 mg/dL — CL (ref 65–99)
Glucose-Capillary: 46 mg/dL — ABNORMAL LOW (ref 65–99)
Glucose-Capillary: 89 mg/dL (ref 65–99)

## 2015-02-18 LAB — I-STAT TROPONIN, ED: Troponin i, poc: 0.02 ng/mL (ref 0.00–0.08)

## 2015-02-18 LAB — BASIC METABOLIC PANEL
Anion gap: 8 (ref 5–15)
BUN: 76 mg/dL — ABNORMAL HIGH (ref 6–20)
CO2: 19 mmol/L — ABNORMAL LOW (ref 22–32)
Calcium: 9.2 mg/dL (ref 8.9–10.3)
Chloride: 108 mmol/L (ref 101–111)
Creatinine, Ser: 7.15 mg/dL — ABNORMAL HIGH (ref 0.61–1.24)
GFR calc Af Amer: 8 mL/min — ABNORMAL LOW (ref >60)
GFR calc non Af Amer: 7 mL/min — ABNORMAL LOW (ref >60)
Glucose, Bld: 40 mg/dL — CL (ref 65–99)
Potassium: 5.5 mmol/L — ABNORMAL HIGH (ref 3.5–5.1)
Sodium: 135 mmol/L (ref 135–145)

## 2015-02-18 LAB — BRAIN NATRIURETIC PEPTIDE: B Natriuretic Peptide: 779 pg/mL — ABNORMAL HIGH (ref 0.0–100.0)

## 2015-02-18 LAB — TSH: TSH: 2.946 u[IU]/mL (ref 0.350–4.500)

## 2015-02-18 MED ORDER — ALLOPURINOL 100 MG PO TABS
100.0000 mg | ORAL_TABLET | Freq: Every day | ORAL | Status: DC
  Administered 2015-02-19 – 2015-02-24 (×6): 100 mg via ORAL
  Filled 2015-02-18 (×7): qty 1

## 2015-02-18 MED ORDER — NIFEDIPINE ER 60 MG PO TB24
60.0000 mg | ORAL_TABLET | Freq: Every day | ORAL | Status: DC
  Administered 2015-02-19 – 2015-02-22 (×4): 60 mg via ORAL
  Filled 2015-02-18 (×5): qty 1

## 2015-02-18 MED ORDER — FUROSEMIDE 80 MG PO TABS
80.0000 mg | ORAL_TABLET | Freq: Two times a day (BID) | ORAL | Status: DC
  Filled 2015-02-18: qty 1

## 2015-02-18 MED ORDER — LORATADINE 10 MG PO TABS
10.0000 mg | ORAL_TABLET | Freq: Every day | ORAL | Status: DC | PRN
  Administered 2015-02-21: 10 mg via ORAL
  Filled 2015-02-18 (×3): qty 1

## 2015-02-18 MED ORDER — FLUTICASONE PROPIONATE 50 MCG/ACT NA SUSP
1.0000 | Freq: Every day | NASAL | Status: DC
  Administered 2015-02-19 – 2015-02-23 (×5): 1 via NASAL
  Filled 2015-02-18: qty 16

## 2015-02-18 MED ORDER — SIMVASTATIN 10 MG PO TABS
10.0000 mg | ORAL_TABLET | Freq: Every day | ORAL | Status: DC
  Administered 2015-02-18 – 2015-02-23 (×6): 10 mg via ORAL
  Filled 2015-02-18 (×7): qty 1

## 2015-02-18 MED ORDER — HEPARIN SODIUM (PORCINE) 5000 UNIT/ML IJ SOLN
5000.0000 [IU] | Freq: Three times a day (TID) | INTRAMUSCULAR | Status: DC
  Administered 2015-02-18 – 2015-02-24 (×17): 5000 [IU] via SUBCUTANEOUS
  Filled 2015-02-18 (×18): qty 1

## 2015-02-18 MED ORDER — CALCITRIOL 0.5 MCG PO CAPS
0.5000 ug | ORAL_CAPSULE | ORAL | Status: DC
  Administered 2015-02-21 – 2015-02-23 (×3): 0.5 ug via ORAL
  Filled 2015-02-18 (×2): qty 1

## 2015-02-18 MED ORDER — LABETALOL HCL 200 MG PO TABS
400.0000 mg | ORAL_TABLET | Freq: Two times a day (BID) | ORAL | Status: DC
  Administered 2015-02-18: 400 mg via ORAL
  Filled 2015-02-18: qty 2

## 2015-02-18 MED ORDER — TRIAMCINOLONE ACETONIDE 0.1 % EX OINT
1.0000 "application " | TOPICAL_OINTMENT | Freq: Two times a day (BID) | CUTANEOUS | Status: DC
  Administered 2015-02-19 – 2015-02-23 (×3): 1 via TOPICAL
  Filled 2015-02-18: qty 15

## 2015-02-18 MED ORDER — INSULIN ASPART 100 UNIT/ML ~~LOC~~ SOLN
0.0000 [IU] | Freq: Three times a day (TID) | SUBCUTANEOUS | Status: DC
  Administered 2015-02-20 – 2015-02-23 (×4): 1 [IU] via SUBCUTANEOUS
  Administered 2015-02-23: 3 [IU] via SUBCUTANEOUS
  Administered 2015-02-24: 1 [IU] via SUBCUTANEOUS

## 2015-02-18 MED ORDER — LEVOTHYROXINE SODIUM 50 MCG PO TABS
50.0000 ug | ORAL_TABLET | Freq: Every day | ORAL | Status: DC
Start: 2015-02-19 — End: 2015-02-24
  Administered 2015-02-19 – 2015-02-24 (×6): 50 ug via ORAL
  Filled 2015-02-18 (×8): qty 1

## 2015-02-18 MED ORDER — FUROSEMIDE 10 MG/ML IJ SOLN
80.0000 mg | Freq: Three times a day (TID) | INTRAMUSCULAR | Status: DC
  Administered 2015-02-18 – 2015-02-24 (×16): 80 mg via INTRAVENOUS
  Filled 2015-02-18 (×20): qty 8

## 2015-02-18 MED ORDER — CALCITRIOL 0.25 MCG PO CAPS
0.2500 ug | ORAL_CAPSULE | ORAL | Status: DC
  Administered 2015-02-19 – 2015-02-20 (×2): 0.25 ug via ORAL
  Filled 2015-02-18 (×5): qty 1

## 2015-02-18 MED ORDER — ACETAMINOPHEN 500 MG PO TABS
500.0000 mg | ORAL_TABLET | Freq: Four times a day (QID) | ORAL | Status: DC | PRN
  Administered 2015-02-19 – 2015-02-22 (×2): 500 mg via ORAL
  Filled 2015-02-18 (×2): qty 1

## 2015-02-18 MED ORDER — DEXTROSE 50 % IV SOLN
INTRAVENOUS | Status: AC
  Administered 2015-02-18: 25 mL
  Filled 2015-02-18: qty 50

## 2015-02-18 MED ORDER — SEVELAMER CARBONATE 800 MG PO TABS
800.0000 mg | ORAL_TABLET | Freq: Three times a day (TID) | ORAL | Status: DC
  Administered 2015-02-19: 800 mg via ORAL
  Filled 2015-02-18 (×4): qty 1

## 2015-02-18 MED ORDER — LABETALOL HCL 100 MG PO TABS
100.0000 mg | ORAL_TABLET | Freq: Two times a day (BID) | ORAL | Status: DC
  Administered 2015-02-19 – 2015-02-20 (×4): 100 mg via ORAL
  Filled 2015-02-18 (×7): qty 1

## 2015-02-18 MED ORDER — DEXTROSE 50 % IV SOLN
1.0000 | Freq: Once | INTRAVENOUS | Status: AC
  Administered 2015-02-18: 50 mL via INTRAVENOUS
  Filled 2015-02-18: qty 50

## 2015-02-18 MED ORDER — MUPIROCIN 2 % EX OINT
TOPICAL_OINTMENT | Freq: Three times a day (TID) | CUTANEOUS | Status: DC
  Administered 2015-02-18: 23:00:00 via TOPICAL
  Administered 2015-02-19: 1 via TOPICAL
  Administered 2015-02-19 – 2015-02-20 (×4): via TOPICAL
  Administered 2015-02-20: 1 via TOPICAL
  Administered 2015-02-21 – 2015-02-22 (×4): via TOPICAL
  Administered 2015-02-22: 1 via TOPICAL
  Administered 2015-02-23 – 2015-02-24 (×4): via TOPICAL
  Filled 2015-02-18 (×3): qty 22

## 2015-02-18 MED ORDER — ALBUTEROL SULFATE (2.5 MG/3ML) 0.083% IN NEBU: 2.5000 mg | INHALATION_SOLUTION | RESPIRATORY_TRACT | Status: DC | PRN

## 2015-02-18 MED ORDER — ASPIRIN 325 MG PO TABS
650.0000 mg | ORAL_TABLET | Freq: Every day | ORAL | Status: DC
Start: 2015-02-18 — End: 2015-02-24
  Administered 2015-02-18 – 2015-02-24 (×7): 650 mg via ORAL
  Filled 2015-02-18 (×8): qty 2

## 2015-02-18 MED ORDER — SODIUM CHLORIDE 0.9 % IJ SOLN
3.0000 mL | Freq: Two times a day (BID) | INTRAMUSCULAR | Status: DC
  Administered 2015-02-18 – 2015-02-24 (×10): 3 mL via INTRAVENOUS

## 2015-02-18 NOTE — Progress Notes (Signed)
Patient presents today for recheck of his Blood Pressure. Patient did take his medication this am. Blood Pressure down from previous readings.

## 2015-02-18 NOTE — ED Notes (Signed)
Pt reports increasing weakness and sob since last night. Pt family reports slurred speech as well. No other neuro deficits noted.

## 2015-02-18 NOTE — ED Provider Notes (Signed)
CSN: [id number]     Arrival date & time 02/18/15  1646 History   First MD Initiated Contact with Patient 02/18/15 1744     Chief Complaint  Patient presents with  . Weakness  . Shortness of Breath     (Consider location/radiation/quality/duration/timing/severity/associated sxs/prior Treatment) HPI Comments: Patient is a 74 year old male with history of diabetes with end-stage renal disease nearing dialysis. He presents for evaluation of a one-week history of progressive weakness and malaise. He denies any fevers or chills. He denies any chest pain. He does report feeling somewhat short of breath.  Patient is a 74 y.o. male presenting with weakness. The history is provided by the patient and the spouse.  Weakness This is a new problem. The problem occurs constantly. The problem has been gradually worsening. Nothing aggravates the symptoms. Nothing relieves the symptoms. He has tried nothing for the symptoms. The treatment provided no relief.    Past Medical History  Diagnosis Date  . Hypertension   . Thyroid disease   . Renal insufficiency   . Hyperlipidemia   . ED (erectile dysfunction)   . Allergy   . Elevated homocysteine   . PVD (peripheral vascular disease)     has plastic aorta  . Arthritis   . Diverticulitis   . Seasonal allergies   . Hypothyroidism   . GERD (gastroesophageal reflux disease)     pepto   . Gout   . Diabetes mellitus     Type 2  . Pneumonia   . Anemia    Past Surgical History  Procedure Laterality Date  . Aortobifemoral bypass    . Breast surgery      left - granulomatous mastitis  . Sbo with lysis adhesions    . Eye surgery Bilateral     cataracts  . Colonoscopy    . Av fistula placement Left 12/01/2013    Procedure: ARTERIOVENOUS (AV) FISTULA CREATION- LEFT BRACHIOCEPHALIC;  Surgeon: Angelia Mould, MD;  Location: [place];  Service: Vascular;  Laterality: Left;  . Revison of arteriovenous fistula Left 02/09/2014    Procedure: REVISON OF  LEFT ARTERIOVENOUS FISTULA - RESECTION OF RENDUNDANT VEIN;  Surgeon: Angelia Mould, MD;  Location: [place];  Service: Vascular;  Laterality: Left;  . Bascilic vein transposition Right 07/27/2014    Procedure: BASCILIC VEIN TRANSPOSITION;  Surgeon: Angelia Mould, MD;  Location: [place];  Service: Vascular;  Laterality: Right;  . Shuntogram Left 04/19/2014    Procedure: FISTULOGRAM;  Surgeon: Angelia Mould, MD;  Location: [facility] CATH LAB;  Service: Cardiovascular;  Laterality: Left;  . Unilateral upper extremeity angiogram N/A 07/12/2014    Procedure: UNILATERAL UPPER Anselmo Rod;  Surgeon: Angelia Mould, MD;  Location: [facility] CATH LAB;  Service: Cardiovascular;  Laterality: N/A;   Family History  Problem Relation Age of Onset  . Aneurysm Mother   . Heart disease Father   . Stroke Father   . Hypertension Father   . Diabetes Father    History  Substance Use Topics  . Smoking status: Former Smoker    Quit date: 09/24/1994  . Smokeless tobacco: Never Used  . Alcohol Use: Yes     Comment: Quit Oct. 1977 ("somewhat heavy")    Review of Systems  Constitutional: Positive for fatigue. Negative for fever and chills.  Neurological: Positive for weakness.  All other systems reviewed and are negative.     Allergies  Penicillins  Home Medications   Prior to Admission medications   Medication Sig  Start Date End Date Taking? Authorizing Provider  allopurinol (ZYLOPRIM) 100 MG tablet Take 100 mg by mouth daily.     Yes Historical Provider, MD  aspirin 325 MG tablet Take 650 mg by mouth daily.    Yes Historical Provider, MD  calcitRIOL (ROCALTROL) 0.25 MCG capsule Take 0.25-0.5 mcg by mouth See admin instructions. Takes 2 capsules on Mon, Wed and Fri Takes 1 capsule all other days   Yes Historical Provider, MD  clobetasol ointment (TEMOVATE) [id number] % Apply 1 application topically 2 (two) times daily. Sores on head 02/14/15  Yes Elby Showers, MD  fluticasone  [facility]) 50 MCG/ACT nasal spray Place 1 spray into both nostrils daily. Patient taking differently: Place 1 spray into both nostrils daily as needed for allergies.  10/08/14  Yes Maryann Mikhail, DO  furosemide (LASIX) 80 MG tablet Take 80 mg by mouth 2 (two) times daily.     Yes Historical Provider, MD  glipiZIDE (GLUCOTROL) 10 MG tablet Take 10 mg by mouth 2 (two) times daily before a meal.     Yes Historical Provider, MD  docusate sodium 100 MG CAPS Take 100 mg by mouth 2 (two) times daily. Patient not taking: Reported on 02/18/2015 10/08/14   Velta Addison Mikhail, DO  ipratropium (ATROVENT) 0.03 % nasal spray Place 2 sprays into the nose daily as needed for rhinitis.     Historical Provider, MD  labetalol (NORMODYNE) 200 MG tablet Take 400 mg by mouth 2 (two) times daily.    Historical Provider, MD  levothyroxine (SYNTHROID, LEVOTHROID) 50 MCG tablet Take 1 tablet (50 mcg total) by mouth daily. 08/06/14   Elby Showers, MD  loratadine (CLARITIN) 10 MG tablet Take 10 mg by mouth daily.    Historical Provider, MD  mupirocin ointment (BACTROBAN) 2 % Use on legs bid Patient taking differently: Apply 1 application topically 2 (two) times daily.  02/14/15   Elby Showers, MD  NIFEdipine (PROCARDIA-XL/ADALAT CC) 60 MG 24 hr tablet Take 60 mg by mouth daily.      Historical Provider, MD  pioglitazone (ACTOS) 45 MG tablet Take 45 mg by mouth daily.      Historical Provider, MD  RENVELA 800 MG tablet Take 800 mg by mouth 3 (three) times daily with meals.  07/27/14   Historical Provider, MD  simvastatin (ZOCOR) 20 MG tablet Take 10 mg by mouth at bedtime.     Historical Provider, MD  triamcinolone ointment (KENALOG) 0.1 % Apply 1 application topically 2 (two) times daily as needed. Use between thighs    Historical Provider, MD   BP 130/51 mmHg  Pulse 55  Temp(Src) 98.5 F (36.9 C) (Oral)  Resp 20  SpO2 100% Physical Exam  Constitutional: He is oriented to person, place, and time. He appears well-developed  and well-nourished.  Patient is an elderly male who appears chronically ill. He appears somewhat pale today.  HENT:  Head: Normocephalic and atraumatic.  Mouth/Throat: Oropharynx is clear and moist.  Eyes: EOM are normal. Pupils are equal, round, and reactive to light.  Neck: Normal range of motion. Neck supple.  Cardiovascular: Normal rate, regular rhythm and normal heart sounds.   No murmur heard. Pulmonary/Chest: Effort normal and breath sounds normal. No respiratory distress. He has no wheezes.  Abdominal: Soft. Bowel sounds are normal. He exhibits no distension. There is no tenderness.  Musculoskeletal: Normal range of motion. He exhibits edema.  There is 1 to 2+ pitting edema present.  Lymphadenopathy:    He has  no cervical adenopathy.  Neurological: He is alert and oriented to person, place, and time. No cranial nerve deficit. He exhibits normal muscle tone. Coordination normal.  Skin: Skin is warm and dry.  Nursing note and vitals reviewed.   ED Course  Procedures (including critical care time) Labs Review Labs Reviewed  CBC - Abnormal; Notable for the following:    RBC 2.72 (*)    Hemoglobin 8.9 (*)    HCT 27.4 (*)    MCV 100.7 (*)    RDW 15.9 (*)    Platelets 121 (*)    All other components within normal limits  CBG MONITORING, ED - Abnormal; Notable for the following:    Glucose-Capillary 34 (*)    All other components within normal limits  BASIC METABOLIC PANEL  BRAIN NATRIURETIC PEPTIDE  I-STAT TROPOININ, ED    Imaging Review No results found.   EKG Interpretation   Date/Time:  Friday Feb 18 2015 17:06:11 EDT Ventricular Rate:  54 PR Interval:  196 QRS Duration: 84 QT Interval:  448 QTC Calculation: 424 R Axis:   -2 Text Interpretation:  Sinus bradycardia with sinus arrhythmia Low voltage  QRS Cannot rule out Anterior infarct , age undetermined Abnormal ECG  Confirmed by Letticia Bhattacharyya  MD, Lania Zawistowski ([id number]) on 02/18/2015 5:55:33 PM      MDM   Final  diagnoses:  None     Patient with history of end-stage renal disease approaching dialysis. He presents for evaluation of shortness of breath, progressive weakness, and not feeling well. His workup reveals elevated BUN and creatinine, elevated BNP, and findings on his chest x-ray that could be suggestive of fluid retention. He is also found to be hypoglycemic while taking a sulfonic urea. He was given D50, however his sugar soon dropped again. I feel as though he will require admission and have spoken with the hospitalist for evaluation of this.    Veryl Speak, MD 02/21/15 [phone]

## 2015-02-18 NOTE — ED Notes (Signed)
MD aware of pt CBG. 8oz OJ given. Attempting IV access.

## 2015-02-18 NOTE — Progress Notes (Signed)
New Admission Note:   Arrival Method: via stretcher from ED Mental Orientation: Alert and oriented x4 Telemetry: Placed on box 6E15 Assessment: Completed Skin: warm, dry, ecchymosis bilateral arm, callus and cracking to bilateral feet, raised bumps on right shin, rash to bilateral thigh/groin, old abdominal midline surgical scar IV: Left Forearm Peripheral IV Normal Saline Locked Pain: 2/10 Tubes: N/A Safety Measures: Educated on fall prevention safety plan, patient acknowledged and understood. Admission: Completed [address] [place] Orientation: Patient has been orientated to the room, unit and staff.  Family: Wife at bedside  Orders have been reviewed and implemented. Will continue to monitor the patient. Call light has been placed within reach and bed alarm has been activated.    Dorothea Glassman, RN  Phone number: [phone]

## 2015-02-18 NOTE — H&P (Addendum)
[facility] History and Physical  [name] [id number] DOB: 1941-05-20 DOA: 02/18/2015  Referring physician: Dr. Veryl Speak  PCP: Johnny Showers, MD   Chief Complaint: weakness and dyspnea   HPI:  Patient is 74 year old male with HTN, HLD, hypothyroidism, diabetes with complications of end-stage renal disease, not on dialysis yet but has AV fistula placed. Presented to [facility] emergency department with main concern of one week duration of progressively worsening weakness and malaise, shortness of breath that initially started with exertion and has progressed to dyspnea at rest over the past 24 hours. This has been associated with episodes of mixed productive and nonproductive cough, lower extremity swelling, weight gain of more than 12 pounds in the past month despite very poor oral intake, decreased urinary output. Patient denies chest pain, no fevers or chills, no sick contacts or exposures. Patient also denies any specific abdominal concerns. Patient reported low sugar levels at home less than 70.  In emergency department, patient noted to be hemodynamically stable, vital signs notable for T 99 F, HR 54, otherwise unremarkable. Blood work notable for K5.5, Cr 7.15, CO2 19. Chest x-ray notable for mild cardiac enlargement with small left pleural effusion. TRH asked to admit for further evaluation.  Assessment and Plan: Active Problems: Generalized weakness - Likely multifactorial and secondary to hypoglycemic event, mild uremia in the setting of end-stage renal disease - Admit to telemetry bed with close monitoring - Nephrology team consulted for further evaluation and assistance - Will need PT evaluation once more medically stable End-stage renal disease - ED doctor consulted nephrologist, we'll follow-up on recommendations - Patient certainly demonstrates signs of volume overload on exam with crackles on lung exam and +2 bilateral lower extremity edema - In addition  with hyperkalemia and mild metabolic acidosis, patient likely to need dialysis soon but since patient hemodynamically stable and non-toxic-appearing, no need for urgent dialysis tonight - We'll try increasing the Lasix to 80 mg IV 3 times a day to see if urine output will improve and help with volume overload - Weight on admission 106.5 kg, monitor weight and strict I/O - repeat electrolyte panel in the morning Hyperkalemia with metabolic acidosis - Secondary to end-stage renal disease - Patient denies chest pain, no acute changes on 12-lead EKG - Repeat electrolyte panel in the morning - Continue Lasix, monitor on telemetry - Follow-up on nephrologist recommendations Anemia of chronic disease, CKD, macrocytic - No signs of active bleeding, repeat CBC in the morning - recent anemia panel May 2016 with normal vitamin B12 and folate level, findings consistent with anemia of chronic disease Morbid obesity - BMI > 40 Hypertension - Continue Lasix, labetalol, nifedipine - Due to mild bradycardia, we'll lower the dose of labetalol from 400 mg by mouth twice a day to 100 mg by mouth twice a day - Please note that we'll increase the dose of Lasix to 80 mg IV 3 times a day as noted above Hyperlipidemia - Continue statin Hypothyroidism - Check TSH, continue Synthroid per home medical regimen 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 2. Small left pleural effusion   Code Status:  Full Family Communication: Pt at bedside Disposition Plan: Admit for further evaluation    [name] [facility] [id number]  Review of  Systems:  Constitutional: Negative for diaphoresis.  HENT: Negative for hearing loss, ear pain, nosebleeds, congestion, sore throat, neck pain, tinnitus and ear discharge.   Eyes: Negative for blurred vision, double vision, photophobia, pain, discharge and redness.  Respiratory: Negative for wheezing and stridor.   Cardiovascular: Negative for chest pain, palpitations.  Gastrointestinal: NNegative for heartburn, constipation, blood in stool and melena.  Genitourinary: Negative for dysuria, urgency, frequency, hematuria and flank pain.  Musculoskeletal: Negative for myalgias, back pain, joint pain and falls.  Skin: Negative for itching and rash.  Neurological: Negative for dizziness and weakness. Endo/Heme/Allergies: Negative for environmental allergies and polydipsia. Does not bruise/bleed easily.  Psychiatric/Behavioral: Negative for suicidal ideas. The patient is not nervous/anxious.      Past Medical History  Diagnosis Date  . Hypertension   . Thyroid disease   . Renal insufficiency   . Hyperlipidemia   . ED (erectile dysfunction)   . Allergy   . Elevated homocysteine   . PVD (peripheral vascular disease)     has plastic aorta  . Arthritis   . Diverticulitis   . Seasonal allergies   . Hypothyroidism   . GERD (gastroesophageal reflux disease)     pepto   . Gout   . Diabetes mellitus     Type 2  . Pneumonia   . Anemia     Past Surgical History  Procedure Laterality Date  . Aortobifemoral bypass    . Breast surgery      left - granulomatous mastitis  . Sbo with lysis adhesions    . Eye surgery Bilateral     cataracts  . Colonoscopy    . Av fistula placement Left 12/01/2013    Procedure: ARTERIOVENOUS (AV) FISTULA CREATION- LEFT BRACHIOCEPHALIC;  Surgeon: Angelia Mould, MD;  Location: [place];  Service: Vascular;  Laterality: Left;  . Revison of arteriovenous fistula Left 02/09/2014    Procedure: REVISON OF LEFT ARTERIOVENOUS FISTULA - RESECTION OF RENDUNDANT  VEIN;  Surgeon: Angelia Mould, MD;  Location: [place];  Service: Vascular;  Laterality: Left;  . Bascilic vein transposition Right 07/27/2014    Procedure: BASCILIC VEIN TRANSPOSITION;  Surgeon: Angelia Mould, MD;  Location: [place];  Service: Vascular;  Laterality: Right;  . Shuntogram Left 04/19/2014    Procedure: FISTULOGRAM;  Surgeon: Angelia Mould, MD;  Location: [facility] CATH LAB;  Service: Cardiovascular;  Laterality: Left;  . Unilateral upper extremeity angiogram N/A 07/12/2014    Procedure: UNILATERAL UPPER Anselmo Rod;  Surgeon: Angelia Mould, MD;  Location: [facility] CATH LAB;  Service: Cardiovascular;  Laterality: N/A;    Social History:  reports that he quit smoking about 20 years ago. He has never used smokeless tobacco. He reports that he drinks alcohol. He reports that he does not use illicit drugs.  Allergies  Allergen Reactions  . Penicillins Rash    Family History  Problem Relation Age of Onset  . Aneurysm Mother   . Heart disease Father   . Stroke Father   . Hypertension Father   . Diabetes Father     Prior to Admission medications   Medication Sig Start Date End Date Taking? Authorizing Provider  acetaminophen (TYLENOL) 500 MG tablet Take 500 mg by mouth every 6 (six) hours as needed.   Yes Historical Provider, MD  allopurinol (ZYLOPRIM) 100 MG tablet Take 100 mg by  mouth daily.     Yes Historical Provider, MD  aspirin 325 MG tablet Take 650 mg by mouth daily.    Yes Historical Provider, MD  calcitRIOL (ROCALTROL) 0.25 MCG capsule Take 0.25-0.5 mcg by mouth See admin instructions. Takes 2 capsules on Mon, Wed and Fri Takes 1 capsule all other days   Yes Historical Provider, MD  clobetasol ointment (TEMOVATE) AB-123456789 % Apply 1 application topically 2 (two) times daily. Sores on head 02/14/15  Yes Johnny Showers, MD  fluticasone [facility]) 50 MCG/ACT nasal spray Place 1 spray into both nostrils daily. Patient taking differently: Place 1 spray into  both nostrils daily as needed for allergies.  10/08/14  Yes Maryann Mikhail, DO  furosemide (LASIX) 80 MG tablet Take 80 mg by mouth 2 (two) times daily.     Yes Historical Provider, MD  glipiZIDE (GLUCOTROL) 10 MG tablet Take 10 mg by mouth 2 (two) times daily before a meal.     Yes Historical Provider, MD  labetalol (NORMODYNE) 200 MG tablet Take 400 mg by mouth 2 (two) times daily.   Yes Historical Provider, MD  levothyroxine (SYNTHROID, LEVOTHROID) 50 MCG tablet Take 1 tablet (50 mcg total) by mouth daily. 08/06/14  Yes Johnny Showers, MD  loratadine (CLARITIN) 10 MG tablet Take 10 mg by mouth daily as needed for allergies.    Yes Historical Provider, MD  mupirocin ointment (BACTROBAN) 2 % Use on legs bid Patient taking differently: Apply 1 application topically 2 (two) times daily.  02/14/15  Yes Johnny Showers, MD  NIFEdipine (PROCARDIA-XL/ADALAT CC) 60 MG 24 hr tablet Take 60 mg by mouth daily.     Yes Historical Provider, MD  pioglitazone (ACTOS) 45 MG tablet Take 45 mg by mouth daily.     Yes Historical Provider, MD  RENVELA 800 MG tablet Take 800 mg by mouth 3 (three) times daily with meals.  07/27/14  Yes Historical Provider, MD  simvastatin (ZOCOR) 20 MG tablet Take 10 mg by mouth at bedtime.    Yes Historical Provider, MD  triamcinolone ointment (KENALOG) 0.1 % Apply 1 application topically 2 (two) times daily. Use between thighs   Yes Historical Provider, MD  docusate sodium 100 MG CAPS Take 100 mg by mouth 2 (two) times daily. Patient not taking: Reported on 02/18/2015 10/08/14   Cristal Ford, DO    Physical Exam: Filed Vitals:   02/18/15 1842 02/18/15 1843 02/18/15 1900 02/18/15 1930  BP: 144/56  133/49 145/56  Pulse:  57 53 55  Temp:      TempSrc:      Resp:  19 15 16   SpO2:  100% 100% 100%    Physical Exam  Constitutional: Appears well-developed and well-nourished. No distress.  HENT: Normocephalic. External right and left ear normal. Oropharynx is clear and moist.  Eyes:  Conjunctivae and EOM are normal. PERRLA, no scleral icterus.  Neck: Normal ROM. Neck supple. No JVD. No tracheal deviation. No thyromegaly.  CVS: Regular rhythm, bradycardia, S1/S2 +, no gallops, no carotid bruit.  Pulmonary: Effort and breath sounds normal, bibasilar crackles, diminished breath sounds at bases Abdominal: Soft. BS +,  no distension, obese abdomen, no tenderness, rebound or guarding.  Musculoskeletal: Normal range of motion. Bilateral lower extremity pitting edema +2  Lymphadenopathy: No lymphadenopathy noted, cervical, inguinal. Neuro: Alert. Normal reflexes, muscle tone coordination. No cranial nerve deficit. Skin: Skin is warm and dry. No rash noted. Not diaphoretic. No erythema. No pallor.  Psychiatric: Normal mood and affect.  Labs on Admission:  Basic Metabolic Panel:  Recent Labs Lab 02/14/15 1012 02/18/15 1725  NA 141 135  K 4.8 5.5*  CL 109 108  CO2 19 19*  GLUCOSE 82 40*  BUN 74* 76*  CREATININE 6.32* 7.15*  CALCIUM 9.2 9.2   Liver Function Tests:  Recent Labs Lab 02/14/15 1012  AST 13  ALT 9  ALKPHOS 40  BILITOT 0.3  PROT 5.3*  ALBUMIN 3.4*   CBC:  Recent Labs Lab 02/14/15 1012 02/15/15 1310 02/18/15 1725  WBC 5.9  --  5.2  NEUTROABS 3.2  --   --   HGB 10.0* 8.7* 8.9*  HCT 31.2*  --  27.4*  MCV 102.6*  --  100.7*  PLT 142*  --  121*   CBG:  Recent Labs Lab 02/18/15 1740 02/18/15 1811 02/18/15 1947  GLUCAP 34* 46* 89   EKG: pending   If 7PM-7AM, please contact night-coverage [email or web address] Password [facility] 02/18/2015, 8:07 PM

## 2015-02-18 NOTE — Progress Notes (Signed)
Received Report from ED.

## 2015-02-19 ENCOUNTER — Encounter: Payer: Self-pay | Admitting: Internal Medicine

## 2015-02-19 DIAGNOSIS — I10 Essential (primary) hypertension: Secondary | ICD-10-CM

## 2015-02-19 DIAGNOSIS — E118 Type 2 diabetes mellitus with unspecified complications: Secondary | ICD-10-CM

## 2015-02-19 DIAGNOSIS — E875 Hyperkalemia: Secondary | ICD-10-CM

## 2015-02-19 DIAGNOSIS — N186 End stage renal disease: Secondary | ICD-10-CM

## 2015-02-19 LAB — CBC
HCT: 27.3 % — ABNORMAL LOW (ref 39.0–52.0)
HCT: 26.8 % — ABNORMAL LOW (ref 39.0–52.0)
Hemoglobin: 8.9 g/dL — ABNORMAL LOW (ref 13.0–17.0)
Hemoglobin: 8.7 g/dL — ABNORMAL LOW (ref 13.0–17.0)
MCH: 32.8 pg (ref 26.0–34.0)
MCH: 32.3 pg (ref 26.0–34.0)
MCHC: 32.6 g/dL (ref 30.0–36.0)
MCHC: 32.5 g/dL (ref 30.0–36.0)
MCV: 100.7 fL — ABNORMAL HIGH (ref 78.0–100.0)
MCV: 99.6 fL (ref 78.0–100.0)
Platelets: 123 10*3/uL — ABNORMAL LOW (ref 150–400)
Platelets: 131 10*3/uL — ABNORMAL LOW (ref 150–400)
RBC: 2.71 MIL/uL — ABNORMAL LOW (ref 4.22–5.81)
RBC: 2.69 MIL/uL — ABNORMAL LOW (ref 4.22–5.81)
RDW: 15.9 % — ABNORMAL HIGH (ref 11.5–15.5)
RDW: 15.7 % — ABNORMAL HIGH (ref 11.5–15.5)
WBC: 5.9 10*3/uL (ref 4.0–10.5)
WBC: 5.8 10*3/uL (ref 4.0–10.5)

## 2015-02-19 LAB — RENAL FUNCTION PANEL
Albumin: 2.8 g/dL — ABNORMAL LOW (ref 3.5–5.0)
Albumin: 2.7 g/dL — ABNORMAL LOW (ref 3.5–5.0)
Anion gap: 13 (ref 5–15)
Anion gap: 10 (ref 5–15)
BUN: 76 mg/dL — ABNORMAL HIGH (ref 6–20)
BUN: 75 mg/dL — ABNORMAL HIGH (ref 6–20)
CO2: 16 mmol/L — ABNORMAL LOW (ref 22–32)
CO2: 18 mmol/L — ABNORMAL LOW (ref 22–32)
Calcium: 9.1 mg/dL (ref 8.9–10.3)
Calcium: 9.1 mg/dL (ref 8.9–10.3)
Chloride: 107 mmol/L (ref 101–111)
Chloride: 108 mmol/L (ref 101–111)
Creatinine, Ser: 7.49 mg/dL — ABNORMAL HIGH (ref 0.61–1.24)
Creatinine, Ser: 7.48 mg/dL — ABNORMAL HIGH (ref 0.61–1.24)
GFR calc Af Amer: 7 mL/min — ABNORMAL LOW (ref >60)
GFR calc Af Amer: 7 mL/min — ABNORMAL LOW (ref >60)
GFR calc non Af Amer: 6 mL/min — ABNORMAL LOW (ref >60)
GFR calc non Af Amer: 6 mL/min — ABNORMAL LOW (ref >60)
Glucose, Bld: 50 mg/dL — ABNORMAL LOW (ref 65–99)
Glucose, Bld: 115 mg/dL — ABNORMAL HIGH (ref 65–99)
Phosphorus: 7 mg/dL — ABNORMAL HIGH (ref 2.5–4.6)
Phosphorus: 7 mg/dL — ABNORMAL HIGH (ref 2.5–4.6)
Potassium: 5.1 mmol/L (ref 3.5–5.1)
Potassium: 4.9 mmol/L (ref 3.5–5.1)
Sodium: 136 mmol/L (ref 135–145)
Sodium: 136 mmol/L (ref 135–145)

## 2015-02-19 LAB — GLUCOSE, CAPILLARY
Glucose-Capillary: 121 mg/dL — ABNORMAL HIGH (ref 65–99)
Glucose-Capillary: 123 mg/dL — ABNORMAL HIGH (ref 65–99)
Glucose-Capillary: 129 mg/dL — ABNORMAL HIGH (ref 65–99)
Glucose-Capillary: 41 mg/dL — CL (ref 65–99)
Glucose-Capillary: 48 mg/dL — ABNORMAL LOW (ref 65–99)
Glucose-Capillary: 52 mg/dL — ABNORMAL LOW (ref 65–99)
Glucose-Capillary: 72 mg/dL (ref 65–99)
Glucose-Capillary: 80 mg/dL (ref 65–99)
Glucose-Capillary: 95 mg/dL (ref 65–99)
Glucose-Capillary: 97 mg/dL (ref 65–99)

## 2015-02-19 MED ORDER — DEXTROSE 50 % IV SOLN
INTRAVENOUS | Status: AC
  Filled 2015-02-19: qty 50

## 2015-02-19 MED ORDER — SEVELAMER CARBONATE 800 MG PO TABS
1600.0000 mg | ORAL_TABLET | Freq: Three times a day (TID) | ORAL | Status: DC
  Administered 2015-02-19: 1600 mg via ORAL
  Administered 2015-02-19: 800 mg via ORAL
  Administered 2015-02-20 – 2015-02-24 (×11): 1600 mg via ORAL
  Filled 2015-02-19 (×17): qty 2

## 2015-02-19 MED ORDER — SODIUM CHLORIDE 0.9 % IV SOLN: 100.0000 mL | INTRAVENOUS | Status: DC | PRN

## 2015-02-19 MED ORDER — DARBEPOETIN ALFA 150 MCG/0.3ML IJ SOSY
150.0000 ug | PREFILLED_SYRINGE | INTRAMUSCULAR | Status: DC
  Administered 2015-02-21: 150 ug via INTRAVENOUS
  Filled 2015-02-19: qty 0.3

## 2015-02-19 MED ORDER — NEPRO/CARBSTEADY PO LIQD
237.0000 mL | ORAL | Status: DC | PRN
  Filled 2015-02-19: qty 237

## 2015-02-19 MED ORDER — PENTAFLUOROPROP-TETRAFLUOROETH EX AERO: 1.0000 "application " | INHALATION_SPRAY | CUTANEOUS | Status: DC | PRN

## 2015-02-19 MED ORDER — SODIUM CHLORIDE 0.9 % IV SOLN
250.0000 mg | INTRAVENOUS | Status: DC
  Administered 2015-02-19 – 2015-02-21 (×2): 250 mg via INTRAVENOUS
  Filled 2015-02-19 (×5): qty 20

## 2015-02-19 MED ORDER — LIDOCAINE HCL (PF) 1 % IJ SOLN: 5.0000 mL | INTRAMUSCULAR | Status: DC | PRN

## 2015-02-19 MED ORDER — LIDOCAINE-PRILOCAINE 2.5-2.5 % EX CREA
1.0000 "application " | TOPICAL_CREAM | CUTANEOUS | Status: DC | PRN
  Filled 2015-02-19: qty 5

## 2015-02-19 MED ORDER — ALTEPLASE 2 MG IJ SOLR
2.0000 mg | Freq: Once | INTRAMUSCULAR | Status: DC | PRN
  Filled 2015-02-19: qty 2

## 2015-02-19 MED ORDER — HEPARIN SODIUM (PORCINE) 1000 UNIT/ML DIALYSIS: 1000.0000 [IU] | INTRAMUSCULAR | Status: DC | PRN

## 2015-02-19 NOTE — Procedures (Signed)
I have seen and examined this patient and agree with the plan of care  Patient seen on dialysis with no complaints  [name] W 02/19/2015, 3:12 PM

## 2015-02-19 NOTE — Progress Notes (Signed)
Hypoglycemic Event  CBG: 41  Treatment: D50 IV 25 mL  Symptoms: None  Possible Reasons for Event: Inadequate meal intake  Comments/MD notified: Hypoglycemic Protocol initiated    Yolanda Bonine  Remember to initiate Hypoglycemia Order Set & complete

## 2015-02-19 NOTE — Progress Notes (Signed)
Utilization review completed.  

## 2015-02-19 NOTE — Progress Notes (Signed)
   Subjective:    Patient ID: [name], male    DOB: 1941/08/10, 74 y.o.   MRN: [id number]  HPI In today for repeat blood pressure check. Had not taken blood pressure medication at time of physical examination. Blood pressure today is once again elevated but he had not taken his medication until just prior to coming to office. B-12 injection given.    Review of Systems     Objective:   Physical Exam  Seen by nurse. Not seen by physician      Assessment & Plan:  B-12 deficiency-received B-12 injection  Essential hypertension-return tomorrow for blood pressure check and have taken medication at least 1-2 hours before coming to office  Chronic kidney disease-severe

## 2015-02-19 NOTE — Progress Notes (Signed)
PROGRESS NOTE  [name] [id number] DOB: 1941-04-29 DOA: 02/18/2015 PCP: Elby Showers, MD 74 year old male with a history of hypertension, diabetes mellitus, hyperlipidemia, CKD stage V, hypothyroidism presented with one-week history of progressive shortness of breath and generalized weakness. The patient also complained of increasing abdominal girth and lower extremity edema with symptoms of orthopnea. The patient also has a 12 pound weight gain over the past week despite decreased oral intake. There was no fevers, chills, chest pain, nausea, vomiting, diarrhea, abdominal pain. In addition, the patient has had episodes of hypoglycemia. After admission, the patient was noted to be fluid overloaded. The patient was started on intravenous furosemide, and nephrology was consulted. The patient has demonstrated progression of his kidney disease. The patient was initiated on hemodialysis Assessment/Plan: Generalized weakness -Secondary to progression of CKD stage V and fluid overload -PT evaluation for deconditioning Dyspnea -Secondary to fluid overload and deconditioning -Echocardiogram -Patient is clinically fluid overloaded -No signs of increased work of breathing New ESRD -Appreciate nephrology -Dialysis initiated 02/19/2015 -CLIP process started -Continue diuretics through the weekend -Continue calcitriol and Renvela - Weight on admission 106.5 kg, monitor weight Diabetes mellitus type 2 -Secondary to sulfonylurea use in the setting of CKD stage V -Discontinue glipizide and Actos for now -NovoLog sliding scale while the patient is in the hospital -02/14/2015 hemoglobin A1c 6.2 Hyperkalemia/metabolic acidosis -Secondary to progression of renal disease -Dialysis initiated Hypertension -Decreased dose of labetalol secondary to bradycardia -Continue nifedipine Hypothyroidism -Continue Synthroid -TSH 2.946 Hyperlipidemia -Continue statin  Family Communication:    Wife update at beside Disposition Plan:   Home when medically stable     Procedures/Studies: Dg Chest 2 View  02/18/2015   CLINICAL DATA:  Wheezing and cough since yesterday  EXAM: CHEST  2 VIEW  COMPARISON:  10/05/2014  FINDINGS: Mild cardiac enlargement. A small left pleural effusion is noted. No right effusion. No airspace consolidation. The visualized osseous structures appear intact.  IMPRESSION: 1. Mild cardiac enlargement. 2. Small left pleural effusion peer .   Electronically Signed   By: Kerby Moors M.D.   On: 02/18/2015 18:55         Subjective: Patient is breathing a little bit better since admission. Denies any fevers, chills, chest pain, vomiting, diarrhea, abdominal pain, dysuria, hematuria, headache, dizziness, syncope.   Objective: Filed Vitals:   02/19/15 0535 02/19/15 0746 02/19/15 0856 02/19/15 1510  BP: 127/62 128/44 142/52 164/78  Pulse: 58 54 58 59  Temp: 98.6 F (37 C) 97.9 F (36.6 C) 98 F (36.7 C) 97.8 F (36.6 C)  TempSrc: Oral Oral Oral Oral  Resp: 18 18 18 18   SpO2: 100% 100% 100% 100%    Intake/Output Summary (Last 24 hours) at 02/19/15 1517 Last data filed at 02/19/15 1142  Gross per 24 hour  Intake    360 ml  Output   1250 ml  Net   -890 ml   Weight change:  Exam:   General:  Pt is alert, follows commands appropriately, not in acute distress  HEENT: No icterus, No thrush,  [place]/AT  Cardiovascular: RRR, S1/S2, no rubs, no gallops, +JVD  Respiratory:    bibasilar crackles. No wheezing. Good air movement.   Abdomen: Soft/+BS, non tender, non distended, no guarding; no hepatosplenomegaly   Extremities: 2+ edema, No lymphangitis, No petechiae, No rashes, no synovitis  Data Reviewed: Basic Metabolic Panel:  Recent Labs Lab 02/14/15 1012 02/18/15 1725 02/19/15 0508  NA 141 135  136  K 4.8 5.5* 5.1  CL 109 108 107  CO2 19 19* 16*  GLUCOSE 82 40* 50*  BUN 74* 76* 76*  CREATININE 6.32* 7.15* 7.49*  CALCIUM 9.2 9.2 9.1    PHOS  --   --  7.0*   Liver Function Tests:  Recent Labs Lab 02/14/15 1012 02/19/15 0508  AST 13  --   ALT 9  --   ALKPHOS 40  --   BILITOT 0.3  --   PROT 5.3*  --   ALBUMIN 3.4* 2.8*   No results for input(s): LIPASE, AMYLASE in the last 168 hours. No results for input(s): AMMONIA in the last 168 hours. CBC:  Recent Labs Lab 02/14/15 1012 02/15/15 1310 02/18/15 1725 02/19/15 0508  WBC 5.9  --  5.2 5.9  NEUTROABS 3.2  --   --   --   HGB 10.0* 8.7* 8.9* 8.9*  HCT 31.2*  --  27.4* 27.3*  MCV 102.6*  --  100.7* 100.7*  PLT 142*  --  121* 123*   Cardiac Enzymes: No results for input(s): CKTOTAL, CKMB, CKMBINDEX, TROPONINI in the last 168 hours. BNP: Invalid input(s): POCBNP CBG:  Recent Labs Lab 02/18/15 2349 02/19/15 0305 02/19/15 0744 02/19/15 0833 02/19/15 1136  GLUCAP 95 72 52* 121* 80    No results found for this or any previous visit (from the past 240 hour(s)).   Scheduled Meds: . allopurinol  100 mg Oral Daily  . aspirin  650 mg Oral Daily  . calcitRIOL  0.25 mcg Oral Q T,Th,S,Su  . [START ON 02/21/2015] calcitRIOL  0.5 mcg Oral Q M,W,F  . [START ON 02/21/2015] darbepoetin (ARANESP) injection - DIALYSIS  150 mcg Intravenous Q Mon-HD  . ferric gluconate (FERRLECIT/NULECIT) IV  250 mg Intravenous Q M,W,F-HD  . fluticasone  1 spray Each Nare Daily  . furosemide  80 mg Intravenous 3 times per day  . heparin  5,000 Units Subcutaneous 3 times per day  . insulin aspart  0-9 Units Subcutaneous TID WC  . labetalol  100 mg Oral BID  . levothyroxine  50 mcg Oral QAC breakfast  . mupirocin ointment   Topical TID  . NIFEdipine  60 mg Oral Daily  . sevelamer carbonate  1,600 mg Oral TID WC  . simvastatin  10 mg Oral QHS  . sodium chloride  3 mL Intravenous Q12H  . triamcinolone ointment  1 application Topical BID   Continuous Infusions:    Harshini Trent, DO  [facility] Pager [phone]  If 7PM-7AM, please contact  night-coverage [email or web address] Password [email or web address] 02/19/2015, 3:17 PM   LOS: 1 day

## 2015-02-19 NOTE — Consult Note (Signed)
[name] Admit Date: 02/18/2015 02/19/2015 Rexene Agent Requesting Physician:  Orson Eva MD  Reason for Consult:  Hyeprkalemia,  Hypervolemia, CKD5 HPI:  [phone] seen at request of Dr. Carles Collet for evaluation of above-mentioned problems. Patient with known CKD 5 following with Dr. Lorrene Reid in our clinic. He has a right upper chimney AV fistula. 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 He attended kidney options training and chose to pursue an center hemodialysis.  He was admitted to the hospital yesterday after presenting with malaise, fatigue, hypoglycemia on a sulfonylurea, progressive exertional and rest dyspnea, loss of appetite, dysgeusia, progressive weight gain despite loss of food intake, increased peripheral edema. He denies nausea, vomiting, chest pain. He was found to have a serum creatinine that it increased to 7.15 and 7.49 this morning. His potassium was 5.5 his hemoglobin is 8.9 and he receives outpatient Aranesp therapy. Chest x-ray demonstrated a small left pleural effusion.    CREAT (mg/dL)  Date Value  02/14/2015 6.32*  10/11/2014 4.68*  11/22/2011 3.00*   CREATININE, SER (mg/dL)  Date Value  02/19/2015 7.49*  02/18/2015 7.15*  10/07/2014 5.30*  10/06/2014 5.63*  10/05/2014 5.59*  10/05/2014 5.76*  07/12/2014 4.90*  04/19/2014 4.20*  ] ROS Balance of 12 systems is negative w/ exceptions as above  PMH  Past Medical History  Diagnosis Date  . Hypertension   . Thyroid disease   . Renal insufficiency   . Hyperlipidemia   . ED (erectile dysfunction)   . Allergy   . Elevated homocysteine   . PVD (peripheral vascular disease)     has plastic aorta  . Arthritis   . Diverticulitis   . Seasonal allergies   . Hypothyroidism   . GERD (gastroesophageal reflux disease)     pepto   . Gout   . Diabetes mellitus     Type 2  . Pneumonia   . Anemia    PSH  Past  Surgical History  Procedure Laterality Date  . Aortobifemoral bypass    . Breast surgery      left - granulomatous mastitis  . Sbo with lysis adhesions    . Eye surgery Bilateral     cataracts  . Colonoscopy    . Av fistula placement Left 12/01/2013    Procedure: ARTERIOVENOUS (AV) FISTULA CREATION- LEFT BRACHIOCEPHALIC;  Surgeon: Angelia Mould, MD;  Location: [place];  Service: Vascular;  Laterality: Left;  . Revison of arteriovenous fistula Left 02/09/2014    Procedure: REVISON OF LEFT ARTERIOVENOUS FISTULA - RESECTION OF RENDUNDANT VEIN;  Surgeon: Angelia Mould, MD;  Location: [place];  Service: Vascular;  Laterality: Left;  . Bascilic vein transposition Right 07/27/2014    Procedure: BASCILIC VEIN TRANSPOSITION;  Surgeon: Angelia Mould, MD;  Location: [place];  Service: Vascular;  Laterality: Right;  . Shuntogram Left 04/19/2014    Procedure: FISTULOGRAM;  Surgeon: Angelia Mould, MD;  Location: [facility] CATH LAB;  Service: Cardiovascular;  Laterality: Left;  . Unilateral upper extremeity angiogram N/A 07/12/2014    Procedure: UNILATERAL UPPER Anselmo Rod;  Surgeon: Angelia Mould, MD;  Location: [facility] CATH LAB;  Service: Cardiovascular;  Laterality: N/A;   FH  Family History  Problem Relation Age of Onset  . Aneurysm Mother   . Heart disease Father   . Stroke Father   . Hypertension Father   . Diabetes Father    [facility]  reports that he quit smoking about 20 years ago. He has never used smokeless tobacco. He reports that he drinks alcohol. He reports that he does not use illicit drugs. Allergies  Allergies  Allergen Reactions  . Penicillins Rash   Home medications Prior to Admission medications   Medication Sig Start Date End Date Taking? Authorizing Provider  acetaminophen (TYLENOL) 500 MG tablet Take 500 mg by mouth every 6 (six) hours as needed.   Yes Historical Provider, MD  allopurinol (ZYLOPRIM) 100 MG tablet Take 100 mg by mouth daily.     Yes  Historical Provider, MD  aspirin 325 MG tablet Take 650 mg by mouth daily.    Yes Historical Provider, MD  calcitRIOL (ROCALTROL) 0.25 MCG capsule Take 0.25-0.5 mcg by mouth See admin instructions. Takes 2 capsules on Mon, Wed and Fri Takes 1 capsule all other days   Yes Historical Provider, MD  clobetasol ointment (TEMOVATE) [id number] % Apply 1 application topically 2 (two) times daily. Sores on head 02/14/15  Yes Elby Showers, MD  fluticasone [facility]) 50 MCG/ACT nasal spray Place 1 spray into both nostrils daily. Patient taking differently: Place 1 spray into both nostrils daily as needed for allergies.  10/08/14  Yes Maryann Mikhail, DO  furosemide (LASIX) 80 MG tablet Take 80 mg by mouth 2 (two) times daily.     Yes Historical Provider, MD  glipiZIDE (GLUCOTROL) 10 MG tablet Take 10 mg by mouth 2 (two) times daily before a meal.     Yes Historical Provider, MD  labetalol (NORMODYNE) 200 MG tablet Take 400 mg by mouth 2 (two) times daily.   Yes Historical Provider, MD  levothyroxine (SYNTHROID, LEVOTHROID) 50 MCG tablet Take 1 tablet (50 mcg total) by mouth daily. 08/06/14  Yes Elby Showers, MD  loratadine (CLARITIN) 10 MG tablet Take 10 mg by mouth daily as needed for allergies.    Yes Historical Provider, MD  mupirocin ointment (BACTROBAN) 2 % Use on legs bid Patient taking differently: Apply 1 application topically 2 (two) times daily.  02/14/15  Yes Elby Showers, MD  NIFEdipine (PROCARDIA-XL/ADALAT CC) 60 MG 24 hr tablet Take 60 mg by mouth daily.     Yes Historical Provider, MD  pioglitazone (ACTOS) 45 MG tablet Take 45 mg by mouth daily.     Yes Historical Provider, MD  RENVELA 800 MG tablet Take 800 mg by mouth 3 (three) times daily with meals.  07/27/14  Yes Historical Provider, MD  simvastatin (ZOCOR) 20 MG tablet Take 10 mg by mouth at bedtime.    Yes Historical Provider, MD  triamcinolone ointment (KENALOG) 0.1 % Apply 1 application topically 2 (two) times daily. Use between thighs   Yes  Historical Provider, MD  docusate sodium 100 MG CAPS Take 100 mg by mouth 2 (two) times daily. Patient not taking: Reported on 02/18/2015 10/08/14   Cristal Ford, DO    Current Medications Scheduled Meds: . allopurinol  100 mg Oral Daily  . aspirin  650 mg Oral Daily  . calcitRIOL  0.25 mcg Oral Q T,Th,S,Su  . [START ON 02/21/2015] calcitRIOL  0.5 mcg Oral Q M,W,F  . fluticasone  1 spray Each Nare Daily  . furosemide  80 mg Intravenous 3 times per day  . heparin  5,000 Units Subcutaneous 3 times per day  . insulin aspart  0-9 Units Subcutaneous TID WC  . labetalol  100 mg Oral BID  . levothyroxine  50 mcg Oral QAC breakfast  . mupirocin  ointment   Topical TID  . NIFEdipine  60 mg Oral Daily  . sevelamer carbonate  800 mg Oral TID WC  . simvastatin  10 mg Oral QHS  . sodium chloride  3 mL Intravenous Q12H  . triamcinolone ointment  1 application Topical BID   Continuous Infusions:  PRN Meds:.acetaminophen, albuterol, loratadine  CBC  Recent Labs Lab 02/14/15 1012 02/15/15 1310 02/18/15 1725 02/19/15 0508  WBC 5.9  --  5.2 5.9  NEUTROABS 3.2  --   --   --   HGB 10.0* 8.7* 8.9* 8.9*  HCT 31.2*  --  27.4* 27.3*  MCV 102.6*  --  100.7* 100.7*  PLT 142*  --  121* [id number]*   Basic Metabolic Panel  Recent Labs Lab 02/14/15 1012 02/18/15 1725 02/19/15 0508  NA 141 135 136  K 4.8 5.5* 5.1  CL 109 108 107  CO2 19 19* 16*  GLUCOSE 82 40* 50*  BUN 74* 76* 76*  CREATININE 6.32* 7.15* 7.49*  CALCIUM 9.2 9.2 9.1  PHOS  --   --  7.0*    Physical Exam  Blood pressure 128/44, pulse 54, temperature 97.9 F (36.6 C), temperature source Oral, resp. rate 18, SpO2 100 %. GEN: NAD, well appearing, lying flat in bed ENT: NCAT EYES: EOMI CV: RRR, no rub, nl s1s2 PULM: CTAB ABD: s/nt/nd SKIN: no rashes/lesions EXT:2+ LEE NEURO: nonfocal, no asterixus AVF: +B/T, appaers mature   Assessment/Plan 56M with progressive CKD5 and now uremic with anorexia, dysgeusia, weight  gain/edema, malaise.  1. New ESRD 1. Primary disease is ischemic CKD + DN, Lorrene Reid at [facility] MD 2. Start HD today: 2h, Qb 200, 1L UF 3. CLIP patient 4. Next HD 5/30 2. HTN/Vol 1. UF 1L today 2. Cont to gradually lower post HD weights 3. BP stable 4. Stop lasix after weekend 3. Anemia 1. Cont aranesp 150 qWk, last given 5/24, due 5/30 2. Give Fe load with low Ferritin 4. CKD-BMD: 1. Cont Calcitriol with HD 2. Cont Renvela as binder, inc 2 qAC 3. Follow Phos with HD 5. DM2 6. HLD 7. Hypothyroidism   Pearson Grippe MD [phone] pgr 02/19/2015, 10:32 AM

## 2015-02-19 NOTE — Progress Notes (Signed)
Hypoglycemic Event  CBG: 52  Treatment: Breakfast intake, 15+ G Carbs  Symptoms: None  Follow-up CBG: Time:121 CBG Result:0833  Possible Reasons for Event: Inadequate meal intake  Comments/MD notified:    Charmaine Placido R  Remember to initiate Hypoglycemia Order Set & complete

## 2015-02-19 NOTE — Patient Instructions (Addendum)
Blood pressure is still not normal today today. Return in one month for B-12 injection. Return tomorrow for blood pressure check having taken blood pressure medication well in advance of visit. Labs faxed to Dr. Lorrene Reid.

## 2015-02-19 NOTE — Progress Notes (Signed)
Hypoglycemic Event  CBG: 48  Treatment: D50 IV 25 mL  Symptoms: None  Follow-up CBG: Time: 2349 CBG Result: 95  Possible Reasons for Event: Inadequate meal intake  Comments/MD notified: Hypoglycemic Protocol Initiated    Dorothea Glassman V  Remember to initiate Hypoglycemia Order Set & complete

## 2015-02-20 DIAGNOSIS — R531 Weakness: Secondary | ICD-10-CM

## 2015-02-20 LAB — HEPATITIS B SURFACE ANTIGEN: Hepatitis B Surface Ag: NEGATIVE

## 2015-02-20 LAB — URINALYSIS, ROUTINE W REFLEX MICROSCOPIC
Bilirubin Urine: NEGATIVE
Glucose, UA: NEGATIVE mg/dL
Ketones, ur: NEGATIVE mg/dL
Leukocytes, UA: NEGATIVE
Nitrite: NEGATIVE
Protein, ur: 100 mg/dL — AB
Specific Gravity, Urine: 1.008 (ref 1.005–1.030)
Urobilinogen, UA: 0.2 mg/dL (ref 0.0–1.0)
pH: 5.5 (ref 5.0–8.0)

## 2015-02-20 LAB — GLUCOSE, CAPILLARY
Glucose-Capillary: 86 mg/dL (ref 65–99)
Glucose-Capillary: 126 mg/dL — ABNORMAL HIGH (ref 65–99)
Glucose-Capillary: 143 mg/dL — ABNORMAL HIGH (ref 65–99)
Glucose-Capillary: 164 mg/dL — ABNORMAL HIGH (ref 65–99)

## 2015-02-20 LAB — HEPATITIS B SURFACE ANTIBODY,QUALITATIVE: Hep B S Ab: NEGATIVE

## 2015-02-20 LAB — URINE MICROSCOPIC-ADD ON

## 2015-02-20 NOTE — Progress Notes (Signed)
PROGRESS NOTE  [name] [id number] DOB: [birth year] DOA: 02/18/2015 PCP: Elby Showers, MD  Brief History 74 year old male with a history of hypertension, diabetes mellitus, hyperlipidemia, CKD stage V, hypothyroidism presented with one-week history of progressive shortness of breath and generalized weakness. The patient also complained of increasing abdominal girth and lower extremity edema with symptoms of orthopnea. The patient also has a 12 pound weight gain over the past week despite decreased oral intake. There was no fevers, chills, chest pain, nausea, vomiting, diarrhea, abdominal pain. In addition, the patient has had episodes of hypoglycemia. After admission, the patient was noted to be fluid overloaded. The patient was started on intravenous furosemide, and nephrology was consulted. The patient has demonstrated progression of his kidney disease. The patient was initiated on hemodialysis. Assessment/Plan: Generalized weakness -Secondary to progression of CKD stage V and fluid overload -PT evaluation for deconditioning Dyspnea -Secondary to fluid overload and deconditioning -Echocardiogram -Patient was clinically fluid overloaded--improving with HD -No signs of increased work of breathing New ESRD -Appreciate nephrology -Dialysis initiated 02/19/2015 -CLIP process started -Continue diuretics through the weekend -Continue calcitriol and Renvela - Weight on admission 106.5 kg, monitor weight Diabetes mellitus type 2 -Secondary to sulfonylurea use in the setting of CKD stage V -Discontinue glipizide and Actos for now -NovoLog sliding scale while the patient is in the hospital -02/14/2015 hemoglobin A1c 6.2 Hyperkalemia/metabolic acidosis -Secondary to progression of renal disease -Dialysis initiated Hypertension -Decreased dose of labetalol secondary to bradycardia -Continue nifedipine -anticipate continued improvement of BP with HD; may need to adjust  regimen Hypothyroidism -Continue Synthroid -TSH 2.946 Hyperlipidemia -Continue statin  Family Communication: Wife update at beside Disposition Plan: Home after CLIP      Procedures/Studies: Dg Chest 2 View  02/18/2015   CLINICAL DATA:  Wheezing and cough since yesterday  EXAM: CHEST  2 VIEW  COMPARISON:  10/05/2014  FINDINGS: Mild cardiac enlargement. A small left pleural effusion is noted. No right effusion. No airspace consolidation. The visualized osseous structures appear intact.  IMPRESSION: 1. Mild cardiac enlargement. 2. Small left pleural effusion peer .   Electronically Signed   By: Kerby Moors M.D.   On: 02/18/2015 18:55         Subjective: Patient is feeling better after his initial session of dialysis. Denies any fever, chills, chest pain, short of breath, nausea, vomiting, diarrhea, abdominal pain.  Objective: Filed Vitals:   02/19/15 1744 02/19/15 2028 02/20/15 0503 02/20/15 0815  BP: 127/51 148/42 126/32 142/46  Pulse: 60 66 65 55  Temp: 97.1 F (36.2 C) 98.3 F (36.8 C) 98.1 F (36.7 C) 98.2 F (36.8 C)  TempSrc: Oral   Oral  Resp: 19 22 22 18   Height:    5\' 11"  (1.803 m)  Weight:  106.595 kg (235 lb)    SpO2: 99% 100% 96% 100%    Intake/Output Summary (Last 24 hours) at 02/20/15 1645 Last data filed at 02/20/15 1406  Gross per 24 hour  Intake   1080 ml  Output   2700 ml  Net  -1620 ml   Weight change:  Exam:   General:  Pt is alert, follows commands appropriately, not in acute distress  HEENT: No icterus, No thrush,  [place]/AT  Cardiovascular: RRR, S1/S2, no rubs, no gallops  Respiratory: CTA bilaterally, no wheezing, no crackles, no rhonchi  Abdomen: Soft/+BS, non tender, non distended, no guarding  Extremities: 2+LE edema, No lymphangitis, No petechiae, No rashes, no  synovitis  Data Reviewed: Basic Metabolic Panel:  Recent Labs Lab 02/14/15 1012 02/18/15 1725 02/19/15 0508 02/19/15 1556  NA 141 135 136 136  K 4.8 5.5*  5.1 4.9  CL 109 108 107 108  CO2 19 19* 16* 18*  GLUCOSE 82 40* 50* 115*  BUN 74* 76* 76* 75*  CREATININE 6.32* 7.15* 7.49* 7.48*  CALCIUM 9.2 9.2 9.1 9.1  PHOS  --   --  7.0* 7.0*   Liver Function Tests:  Recent Labs Lab 02/14/15 1012 02/19/15 0508 02/19/15 1556  AST 13  --   --   ALT 9  --   --   ALKPHOS 40  --   --   BILITOT 0.3  --   --   PROT 5.3*  --   --   ALBUMIN 3.4* 2.8* 2.7*   No results for input(s): LIPASE, AMYLASE in the last 168 hours. No results for input(s): AMMONIA in the last 168 hours. CBC:  Recent Labs Lab 02/14/15 1012 02/15/15 1310 02/18/15 1725 02/19/15 0508 02/19/15 1555  WBC 5.9  --  5.2 5.9 5.8  NEUTROABS 3.2  --   --   --   --   HGB 10.0* 8.7* 8.9* 8.9* 8.7*  HCT 31.2*  --  27.4* 27.3* 26.8*  MCV 102.6*  --  100.7* 100.7* 99.6  PLT 142*  --  121* 123* 131*   Cardiac Enzymes: No results for input(s): CKTOTAL, CKMB, CKMBINDEX, TROPONINI in the last 168 hours. BNP: Invalid input(s): POCBNP CBG:  Recent Labs Lab 02/19/15 1812 02/19/15 2035 02/19/15 2243 02/20/15 0741 02/20/15 1141  GLUCAP 97 123* 129* 86 126*    No results found for this or any previous visit (from the past 240 hour(s)).   Scheduled Meds: . allopurinol  100 mg Oral Daily  . aspirin  650 mg Oral Daily  . calcitRIOL  0.25 mcg Oral Q T,Th,S,Su  . [START ON 02/21/2015] calcitRIOL  0.5 mcg Oral Q M,W,F  . [START ON 02/21/2015] darbepoetin (ARANESP) injection - DIALYSIS  150 mcg Intravenous Q Mon-HD  . ferric gluconate (FERRLECIT/NULECIT) IV  250 mg Intravenous Q M,W,F-HD  . fluticasone  1 spray Each Nare Daily  . furosemide  80 mg Intravenous 3 times per day  . heparin  5,000 Units Subcutaneous 3 times per day  . insulin aspart  0-9 Units Subcutaneous TID WC  . labetalol  100 mg Oral BID  . levothyroxine  50 mcg Oral QAC breakfast  . mupirocin ointment   Topical TID  . NIFEdipine  60 mg Oral Daily  . sevelamer carbonate  1,600 mg Oral TID WC  . simvastatin   10 mg Oral QHS  . sodium chloride  3 mL Intravenous Q12H  . triamcinolone ointment  1 application Topical BID   Continuous Infusions:    Natalio Salois, DO  [facility] Pager [phone]  If 7PM-7AM, please contact night-coverage [email or web address] Password [email or web address] 02/20/2015, 4:45 PM   LOS: 2 days

## 2015-02-20 NOTE — Progress Notes (Signed)
Admit: 02/18/2015 LOS: 2  58M with progressive CKD5 and now uremic with anorexia, dysgeusia, weight gain/edema, malaise == new start HD  Subjective:  HD #1 yesterday,  2h, 0.9L UF, used AVF Good UOP still, on lasix No co, tolerated HD well.  05/28 0701 - 05/29 0700 In: 960 [P.O.:840; IV Piggyback:120] Out: 2350 [Urine:1450]  Filed Weights   02/19/15 1530 02/19/15 2028  Weight: 110.1 kg (242 lb 11.6 oz) 106.595 kg (235 lb)    Scheduled Meds: . allopurinol  100 mg Oral Daily  . aspirin  650 mg Oral Daily  . calcitRIOL  0.25 mcg Oral Q T,Th,S,Su  . [START ON 02/21/2015] calcitRIOL  0.5 mcg Oral Q M,W,F  . [START ON 02/21/2015] darbepoetin (ARANESP) injection - DIALYSIS  150 mcg Intravenous Q Mon-HD  . ferric gluconate (FERRLECIT/NULECIT) IV  250 mg Intravenous Q M,W,F-HD  . fluticasone  1 spray Each Nare Daily  . furosemide  80 mg Intravenous 3 times per day  . heparin  5,000 Units Subcutaneous 3 times per day  . insulin aspart  0-9 Units Subcutaneous TID WC  . labetalol  100 mg Oral BID  . levothyroxine  50 mcg Oral QAC breakfast  . mupirocin ointment   Topical TID  . NIFEdipine  60 mg Oral Daily  . sevelamer carbonate  1,600 mg Oral TID WC  . simvastatin  10 mg Oral QHS  . sodium chloride  3 mL Intravenous Q12H  . triamcinolone ointment  1 application Topical BID   Continuous Infusions:  PRN Meds:.acetaminophen, albuterol, loratadine  Current Labs: reviewed    Physical Exam:  Blood pressure 142/46, pulse 55, temperature 98.2 F (36.8 C), temperature source Oral, resp. rate 18, weight 106.595 kg (235 lb), SpO2 100 %. GEN: NAD, well appearing, lying flat in bed ENT: NCAT EYES: EOMI CV: RRR, no rub, nl s1s2 PULM: CTAB ABD: s/nt/nd SKIN: no rashes/lesions EXT:2+ LEE NEURO: nonfocal, no asterixus AVF: +B/T, appaers mature  A/P 1. New ESRD 1. Primary disease is ischemic CKD + DN, [name] at [facility] MD 2. Start HD 02/19/15:  3. Tx #2 5/30, Qb 250, T= 3h, UF =  1.5L 4. CLIP patient 2. HTN/Vol 1. Cont to gradually lower post HD weights 2. BP stable 3. Stop lasix after weekend 3. Anemia 1. Cont aranesp 150 qWk, last given 5/24, due 5/30 2. Started Fe load with low Ferritin 4. CKD-BMD: 1. Cont Calcitriol with HD 2. Cont Renvela as binder, inc 2 qAC 3. Follow Phos with HD 5. DM2 6. HLD 7. Hypothyroidism  Johnny Grippe MD 02/20/2015, 8:28 AM   Recent Labs Lab 02/18/15 1725 02/19/15 0508 02/19/15 1556  NA 135 136 136  K 5.5* 5.1 4.9  CL 108 107 108  CO2 19* 16* 18*  GLUCOSE 40* 50* 115*  BUN 76* 76* 75*  CREATININE 7.15* 7.49* 7.48*  CALCIUM 9.2 9.1 9.1  PHOS  --  7.0* 7.0*    Recent Labs Lab 02/14/15 1012  02/18/15 1725 02/19/15 0508 02/19/15 1555  WBC 5.9  --  5.2 5.9 5.8  NEUTROABS 3.2  --   --   --   --   HGB 10.0*  < > 8.9* 8.9* 8.7*  HCT 31.2*  --  27.4* 27.3* 26.8*  MCV 102.6*  --  100.7* 100.7* 99.6  PLT 142*  --  121* 123* 131*  < > = values in this interval not displayed.

## 2015-02-21 ENCOUNTER — Other Ambulatory Visit (HOSPITAL_COMMUNITY): Payer: TRICARE For Life (TFL)

## 2015-02-21 ENCOUNTER — Inpatient Hospital Stay (HOSPITAL_COMMUNITY): Payer: Medicare Other

## 2015-02-21 DIAGNOSIS — R0600 Dyspnea, unspecified: Secondary | ICD-10-CM

## 2015-02-21 LAB — CBC
HCT: 25.4 % — ABNORMAL LOW (ref 39.0–52.0)
Hemoglobin: 8.5 g/dL — ABNORMAL LOW (ref 13.0–17.0)
MCH: 33.5 pg (ref 26.0–34.0)
MCHC: 33.5 g/dL (ref 30.0–36.0)
MCV: 100 fL (ref 78.0–100.0)
Platelets: 127 10*3/uL — ABNORMAL LOW (ref 150–400)
RBC: 2.54 MIL/uL — ABNORMAL LOW (ref 4.22–5.81)
RDW: 15.8 % — ABNORMAL HIGH (ref 11.5–15.5)
WBC: 6.7 10*3/uL (ref 4.0–10.5)

## 2015-02-21 LAB — RENAL FUNCTION PANEL
Albumin: 2.6 g/dL — ABNORMAL LOW (ref 3.5–5.0)
Anion gap: 8 (ref 5–15)
BUN: 52 mg/dL — ABNORMAL HIGH (ref 6–20)
CO2: 22 mmol/L (ref 22–32)
Calcium: 9 mg/dL (ref 8.9–10.3)
Chloride: 107 mmol/L (ref 101–111)
Creatinine, Ser: 6.25 mg/dL — ABNORMAL HIGH (ref 0.61–1.24)
GFR calc Af Amer: 9 mL/min — ABNORMAL LOW (ref >60)
GFR calc non Af Amer: 8 mL/min — ABNORMAL LOW (ref >60)
Glucose, Bld: 117 mg/dL — ABNORMAL HIGH (ref 65–99)
Phosphorus: 4.9 mg/dL — ABNORMAL HIGH (ref 2.5–4.6)
Potassium: 3.7 mmol/L (ref 3.5–5.1)
Sodium: 137 mmol/L (ref 135–145)

## 2015-02-21 LAB — GLUCOSE, CAPILLARY
Glucose-Capillary: 115 mg/dL — ABNORMAL HIGH (ref 65–99)
Glucose-Capillary: 130 mg/dL — ABNORMAL HIGH (ref 65–99)
Glucose-Capillary: 167 mg/dL — ABNORMAL HIGH (ref 65–99)

## 2015-02-21 MED ORDER — DARBEPOETIN ALFA 150 MCG/0.3ML IJ SOSY
PREFILLED_SYRINGE | INTRAMUSCULAR | Status: AC
  Filled 2015-02-21: qty 0.3

## 2015-02-21 MED ORDER — CARVEDILOL 3.125 MG PO TABS
3.1250 mg | ORAL_TABLET | Freq: Two times a day (BID) | ORAL | Status: DC
  Administered 2015-02-21 – 2015-02-22 (×2): 3.125 mg via ORAL
  Filled 2015-02-21 (×4): qty 1

## 2015-02-21 NOTE — Progress Notes (Signed)
PT Cancellation Note  Patient Details Name: [name] MRN: [id number] DOB: 10/23/1940   Cancelled Treatment:    Reason Eval/Treat Not Completed: Patient at procedure or test/unavailable   Currently in HD;  Will follow up later today as time allows;  Otherwise, will follow up for PT tomorrow;   Thank you,  Roney Marion, [place] Pager [phone] Office [phone]     Roney Marion [facility] 02/21/2015, 8:29 AM

## 2015-02-21 NOTE — Progress Notes (Signed)
Patient ID: REGINAL ADVANI, male   DOB: 1941-03-02, 74 y.o.   MRN: [id number]  [facility] Progress Note   Assessment/ Plan:   1. 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 ESRD: Currently tolerating hemodialysis without problems-had an episode of tachycardia (asymptomatic) earlier on in the treatment today. Process initiated to get him on outpatient dialysis unit 3. Anemia: Low hemoglobin-on Aranesp/intravenous iron, continue to monitor trend 4. CKD-MBD: Continue Renvela 3 times a day with meals for phosphorus binding and calcitriol for PTH suppression 5. Nutrition: Appetite somewhat better, continue nutritional supplements 6. Hypertension: Blood pressures are acceptable, continue ultrafiltration with hemodialysis and nifedipine/labetalol  Subjective:   Reports to be feeling fair-still having some orthopnea and shortness of breath    Objective:   BP 147/74 mmHg  Pulse 64  Temp(Src) 98.5 F (36.9 C) (Oral)  Resp 18  Ht 5\' 11"  (1.803 m)  Wt 105.1 kg (231 lb 11.3 oz)  BMI 32.33 kg/m2  SpO2 100%  Physical Exam: Gen: Appears to be comfortable resting on dialysis CVS: Pulse regular in rate and rhythm Resp: Clear to auscultation, no rales Abd: Soft, obese, nontender Ext: 1-2+ lower extremity edema  Labs: BMET  Recent Labs Lab 02/14/15 1012 02/18/15 1725 02/19/15 0508 02/19/15 1556 02/21/15 0828  NA 141 135 136 136 137  K 4.8 5.5* 5.1 4.9 3.7  CL 109 108 107 108 107  CO2 19 19* 16* 18* 22  GLUCOSE 82 40* 50* 115* 117*  BUN 74* 76* 76* 75* 52*  CREATININE 6.32* 7.15* 7.49* 7.48* 6.25*  CALCIUM 9.2 9.2 9.1 9.1 9.0  PHOS  --   --  7.0* 7.0* 4.9*   CBC  Recent Labs Lab 02/14/15 1012  02/18/15 1725 02/19/15 0508 02/19/15 1555 02/21/15 0827  WBC 5.9  --  5.2 5.9 5.8 6.7  NEUTROABS 3.2  --   --   --   --   --   HGB 10.0*  < > 8.9*  8.9* 8.7* 8.5*  HCT 31.2*  --  27.4* 27.3* 26.8* 25.4*  MCV 102.6*  --  100.7* 100.7* 99.6 100.0  PLT 142*  --  121* 123* 131* 127*  < > = values in this interval not displayed.  Medications:    . allopurinol  100 mg Oral Daily  . aspirin  650 mg Oral Daily  . calcitRIOL  0.25 mcg Oral Q T,Th,S,Su  . calcitRIOL  0.5 mcg Oral Q M,W,F  . Darbepoetin Alfa      . darbepoetin (ARANESP) injection - DIALYSIS  150 mcg Intravenous Q Mon-HD  . ferric gluconate (FERRLECIT/NULECIT) IV  250 mg Intravenous Q M,W,F-HD  . fluticasone  1 spray Each Nare Daily  . furosemide  80 mg Intravenous 3 times per day  . heparin  5,000 Units Subcutaneous 3 times per day  . insulin aspart  0-9 Units Subcutaneous TID WC  . labetalol  100 mg Oral BID  . levothyroxine  50 mcg Oral QAC breakfast  . mupirocin ointment   Topical TID  . NIFEdipine  60 mg Oral Daily  . sevelamer carbonate  1,600 mg Oral TID WC  . simvastatin  10 mg Oral QHS  . sodium chloride  3 mL Intravenous Q12H  . triamcinolone ointment  1 application Topical BID   Elmarie Shiley, MD 02/21/2015, 9:59 AM

## 2015-02-21 NOTE — Progress Notes (Signed)
PROGRESS NOTE  [name] [id number] DOB: [birth year] DOA: 02/18/2015 PCP: Elby Showers, MD  Brief History 74 year old male with a history of hypertension, diabetes mellitus, hyperlipidemia, CKD stage V, hypothyroidism presented with one-week history of progressive shortness of breath and generalized weakness. The patient also complained of increasing abdominal girth and lower extremity edema with symptoms of orthopnea. The patient also has a 12 pound weight gain over the past week despite decreased oral intake. There was no fevers, chills, chest pain, nausea, vomiting, diarrhea, abdominal pain. In addition, the patient has had episodes of hypoglycemia. After admission, the patient was noted to be fluid overloaded. The patient was started on intravenous furosemide, and nephrology was consulted. The patient has demonstrated progression of his kidney disease. The patient was initiated on hemodialysis. Assessment/Plan: Generalized weakness -Secondary to progression of CKD stage V and fluid overload -PT evaluation for deconditioning Dyspnea -Secondary to fluid overload, Cor Pulmonale, deconditioning -Echocardiogram--EF 65-70%, mod TR, PAP 60, mild AS, no WMA -Patient was clinically fluid overloaded--improving with HD -No signs of increased work of breathing New ESRD -Appreciate nephrology -Dialysis initiated 02/19/2015 -CLIP process started -Continue diuretics through the weekend -Continue calcitriol and Renvela - Weight on admission 106.5 kg-->103.6kg Diabetes mellitus type 2 -Secondary to sulfonylurea use in the setting of CKD stage V -Discontinue glipizide and Actos for now -NovoLog sliding scale while the patient is in the hospital -02/14/2015 hemoglobin A1c 6.2 Thrombocytopenia -Chronic, 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     Procedures/Studies: Dg Chest 2 View  02/18/2015   CLINICAL DATA:  Wheezing and cough since yesterday  EXAM: CHEST  2 VIEW  COMPARISON:  10/05/2014  FINDINGS: Mild cardiac enlargement. A small left pleural effusion is noted. No right effusion. No airspace consolidation. The visualized osseous structures appear intact.  IMPRESSION: 1. Mild cardiac enlargement. 2. Small left pleural effusion peer .   Electronically Signed   By: Kerby Moors M.D.   On: 02/18/2015 18:55         Subjective: Patient is feeling better. He denies any fevers, chills, chest pain, chest breath, nausea, vomiting, diarrhea. His appetite is coming back. No abdominal pain   Objective: Filed Vitals:   02/21/15 1030 02/21/15 1100 02/21/15 1105 02/21/15 1659  BP: 130/57 134/57 135/67 144/51  Pulse: 63 60 63 62  Temp:   98.3 F (36.8 C) 98.5 F (36.9 C)  TempSrc:   Oral Oral  Resp:   20 18  Height:      Weight:   103.6 kg (228 lb 6.3 oz)   SpO2:   99% 100%    Intake/Output Summary (Last 24 hours) at 02/21/15 1740 Last data filed at 02/21/15 1324  Gross per 24 hour  Intake   1080 ml  Output   3175 ml  Net  -2095 ml   Weight change: -3.323 kg (-7 lb 5.2 oz) Exam:   General:  Pt is alert, follows commands appropriately, not in acute distress  HEENT: No icterus, No thrush,  [place]/AT  Cardiovascular: RRR, S1/S2, no rubs, no gallops  Respiratory: CTA bilaterally, no wheezing, no crackles, no rhonchi  Abdomen: Soft/+BS,  non tender, non distended, no guarding  Extremities: 2+LE edema, No lymphangitis, No petechiae, No rashes, no synovitis  Data Reviewed: Basic Metabolic Panel:  Recent Labs Lab  02/18/15 1725 02/19/15 0508 02/19/15 1556 02/21/15 0828  NA 135 136 136 137  K 5.5* 5.1 4.9 3.7  CL 108 107 108 107  CO2 19* 16* 18* 22  GLUCOSE 40* 50* 115* 117*  BUN 76* 76* 75* 52*  CREATININE 7.15* 7.49* 7.48* 6.25*  CALCIUM 9.2 9.1 9.1 9.0  PHOS  --  7.0* 7.0* 4.9*   Liver Function Tests:  Recent Labs Lab 02/19/15 0508 02/19/15 1556 02/21/15 0828  ALBUMIN 2.8* 2.7* 2.6*   No results for input(s): LIPASE, AMYLASE in the last 168 hours. No results for input(s): AMMONIA in the last 168 hours. CBC:  Recent Labs Lab 02/15/15 1310 02/18/15 1725 02/19/15 0508 02/19/15 1555 02/21/15 0827  WBC  --  5.2 5.9 5.8 6.7  HGB 8.7* 8.9* 8.9* 8.7* 8.5*  HCT  --  27.4* 27.3* 26.8* 25.4*  MCV  --  100.7* 100.7* 99.6 100.0  PLT  --  121* 123* 131* 127*   Cardiac Enzymes: No results for input(s): CKTOTAL, CKMB, CKMBINDEX, TROPONINI in the last 168 hours. BNP: Invalid input(s): POCBNP CBG:  Recent Labs Lab 02/20/15 1141 02/20/15 1659 02/20/15 2203 02/21/15 1220 02/21/15 1620  GLUCAP 126* 143* 164* 115* 130*    No results found for this or any previous visit (from the past 240 hour(s)).   Scheduled Meds: . allopurinol  100 mg Oral Daily  . aspirin  650 mg Oral Daily  . calcitRIOL  0.25 mcg Oral Q T,Th,S,Su  . calcitRIOL  0.5 mcg Oral Q M,W,F  . carvedilol  3.125 mg Oral BID WC  . Darbepoetin Alfa      . darbepoetin (ARANESP) injection - DIALYSIS  150 mcg Intravenous Q Mon-HD  . ferric gluconate (FERRLECIT/NULECIT) IV  250 mg Intravenous Q M,W,F-HD  . fluticasone  1 spray Each Nare Daily  . furosemide  80 mg Intravenous 3 times per day  . heparin  5,000 Units Subcutaneous 3 times per day  . insulin aspart  0-9 Units Subcutaneous TID WC  . levothyroxine  50 mcg Oral QAC breakfast  . mupirocin ointment   Topical TID  . NIFEdipine  60 mg Oral Daily  . sevelamer carbonate  1,600 mg Oral TID WC  . simvastatin  10 mg Oral QHS  . sodium chloride  3 mL Intravenous  Q12H  . triamcinolone ointment  1 application Topical BID   Continuous Infusions:    Zaydrian Batta, DO  [facility] Pager [phone]  If 7PM-7AM, please contact night-coverage [email or web address] Password [email or web address] 02/21/2015, 5:40 PM   LOS: 3 days \

## 2015-02-22 DIAGNOSIS — E162 Hypoglycemia, unspecified: Secondary | ICD-10-CM | POA: Insufficient documentation

## 2015-02-22 LAB — RENAL FUNCTION PANEL
Albumin: 2.7 g/dL — ABNORMAL LOW (ref 3.5–5.0)
Anion gap: 10 (ref 5–15)
BUN: 31 mg/dL — ABNORMAL HIGH (ref 6–20)
CO2: 26 mmol/L (ref 22–32)
Calcium: 9 mg/dL (ref 8.9–10.3)
Chloride: 104 mmol/L (ref 101–111)
Creatinine, Ser: 4.95 mg/dL — ABNORMAL HIGH (ref 0.61–1.24)
GFR calc Af Amer: 12 mL/min — ABNORMAL LOW (ref >60)
GFR calc non Af Amer: 10 mL/min — ABNORMAL LOW (ref >60)
Glucose, Bld: 170 mg/dL — ABNORMAL HIGH (ref 65–99)
Phosphorus: 3.6 mg/dL (ref 2.5–4.6)
Potassium: 3.4 mmol/L — ABNORMAL LOW (ref 3.5–5.1)
Sodium: 140 mmol/L (ref 135–145)

## 2015-02-22 LAB — GLUCOSE, CAPILLARY
Glucose-Capillary: 121 mg/dL — ABNORMAL HIGH (ref 65–99)
Glucose-Capillary: 125 mg/dL — ABNORMAL HIGH (ref 65–99)
Glucose-Capillary: 139 mg/dL — ABNORMAL HIGH (ref 65–99)

## 2015-02-22 LAB — HEMOGLOBIN A1C
Hgb A1c MFr Bld: 6.2 % — ABNORMAL HIGH (ref 4.8–5.6)
Mean Plasma Glucose: 131 mg/dL

## 2015-02-22 MED ORDER — NIFEDIPINE ER 30 MG PO TB24
30.0000 mg | ORAL_TABLET | Freq: Once | ORAL | Status: AC
  Administered 2015-02-22: 30 mg via ORAL
  Filled 2015-02-22: qty 1

## 2015-02-22 MED ORDER — CARVEDILOL 6.25 MG PO TABS
6.2500 mg | ORAL_TABLET | Freq: Two times a day (BID) | ORAL | Status: DC
  Filled 2015-02-22: qty 1

## 2015-02-22 MED ORDER — CARVEDILOL 3.125 MG PO TABS
3.1250 mg | ORAL_TABLET | Freq: Two times a day (BID) | ORAL | Status: DC
  Administered 2015-02-23 (×2): 3.125 mg via ORAL
  Filled 2015-02-22 (×5): qty 1

## 2015-02-22 MED ORDER — NIFEDIPINE ER OSMOTIC RELEASE 90 MG PO TB24
90.0000 mg | ORAL_TABLET | Freq: Every day | ORAL | Status: DC
  Administered 2015-02-23: 90 mg via ORAL
  Filled 2015-02-22 (×2): qty 1

## 2015-02-22 MED ORDER — NEPRO/CARBSTEADY PO LIQD
237.0000 mL | Freq: Every day | ORAL | Status: DC
  Administered 2015-02-22 – 2015-02-24 (×3): 237 mL via ORAL

## 2015-02-22 MED ORDER — CARVEDILOL 3.125 MG PO TABS: 3.1250 mg | ORAL_TABLET | Freq: Once | ORAL | Status: DC

## 2015-02-22 MED ORDER — HYDRALAZINE HCL 20 MG/ML IJ SOLN: 5.0000 mg | Freq: Four times a day (QID) | INTRAMUSCULAR | Status: DC | PRN

## 2015-02-22 NOTE — Progress Notes (Signed)
Pt BP 0000000 systollic [id number] on re-check.  Spoke with Dr. Carles Collet, will increase coreg to 6.25 and one time order procardia 30 mg po.

## 2015-02-22 NOTE — Care Management Note (Signed)
Case Management Note  Patient Details  Name: [name] MRN: [id number] Date of Birth: [birth year]  Subjective/Objective:                 CM following for progression and d/c planning   Action/Plan:  Pt awaitng CLIP process to begin outpatient hemodialysis Expected Discharge Date:       02/24/2015           Expected Discharge Plan:  Home/Self Care  In-House Referral:  NA  Discharge planning Services  NA  Post Acute Care Choice:  NA Choice offered to:  NA  DME Arranged:    DME Agency:     HH Arranged:    HH Agency:     Status of Service:     Medicare Important Message Given:  Yes Date Medicare IM Given:  02/22/15 Medicare IM give by:  Jasmine Pang RN MPH, case manager, [phone] Date Additional Medicare IM Given:    Additional Medicare Important Message give by:     If discussed at [place] of Stay Meetings, dates discussed:    Additional Comments:  Adron Bene, RN 02/22/2015, 12:05 PM

## 2015-02-22 NOTE — Progress Notes (Signed)
Initial Nutrition Assessment  DOCUMENTATION CODES:  Obesity unspecified  INTERVENTION:  Nepro shake (once daily), each supplement provides 425 kcal and 19 grams protein.  Renal diet education given.   NUTRITION DIAGNOSIS:  Increased nutrient needs related to chronic illness as evidenced by estimated needs.  GOAL:  Patient will meet greater than or equal to 90% of their needs  MONITOR:  PO intake, Supplement acceptance, Weight trends, Labs, I & O's  REASON FOR ASSESSMENT:  Consult Diet education  ASSESSMENT: Pt with a history of hypertension, diabetes mellitus, hyperlipidemia, CKD stage V, hypothyroidism presented with one-week history of progressive shortness of breath and generalized weakness. Pt complained of increasing abdominal girth and lower extremity edema with symptoms of orthopnea. Pt found to be fluid overloaded, initiated on HD.  Pt reports appetite has been improving. Meal completion has been 100%. PTA he reports having a lack of appetite over the past 2 weeks. He reports he has still been consuming 2-3 meals a day. Weight has been fluctuating due to fluid status. RD to order Nepro shake to aid in caloric and protein needs. RD was additionally consulted for a renal diet education. Education provided. Pt additionally requested to revisit tomorrow to talk with his wife about the renal diet as she provides food for him.   Labs: Low potassium and GFR. High BUN and creatinine.   Height:  Ht Readings from Last 1 Encounters:  02/20/15 5\' 11"  (1.803 m)    Weight:  Wt Readings from Last 1 Encounters:  02/22/15 220 lb 14.4 oz (100.2 kg)    Ideal Body Weight:  78 kg  Wt Readings from Last 10 Encounters:  02/22/15 220 lb 14.4 oz (100.2 kg)  02/14/15 235 lb (106.595 kg)  02/02/15 234 lb 3.2 oz (106.232 kg)  11/10/14 219 lb (99.338 kg)  10/11/14 216 lb (97.977 kg)  10/06/14 221 lb 12.5 oz (100.6 kg)  09/08/14 234 lb 8 oz (106.369 kg)  08/06/14 231 lb (104.781  kg)  07/27/14 224 lb (101.606 kg)  06/30/14 225 lb (102.059 kg)    BMI:  Body mass index is 30.82 kg/(m^2).   Adjusted body weight: 97.5 kg  Estimated Nutritional Needs:  Kcal:  2050-2300  Protein:  120-130 grams  Fluid:  1.2 L/day  Skin:   (+2 LE edema)  Diet Order:  Diet renal with fluid restriction Fluid restriction:: 1200 mL Fluid; Room service appropriate?: Yes; Fluid consistency:: Thin  EDUCATION NEEDS:  Education needs addressed   Intake/Output Summary (Last 24 hours) at 02/22/15 1506 Last data filed at 02/22/15 1156  Gross per 24 hour  Intake    960 ml  Output   2699 ml  Net  -1739 ml    Last BM:  5/30  Kallie Locks, MS, RD, LDN Pager # [phone] After hours/ weekend pager # [phone]

## 2015-02-22 NOTE — Procedures (Signed)
Patient seen on Hemodialysis. QB 300, UF goal 2.5L Treatment adjusted as needed.  Elmarie Shiley MD [facility]. Office # [phone] Pager # [phone] 9:40 AM

## 2015-02-22 NOTE — Evaluation (Signed)
Physical Therapy Evaluation Patient Details Name: [name] MRN: [id number] DOB: 08-Jan-1941 Today's Date: 02/22/2015   History of Present Illness  74 year old male with a history of hypertension, diabetes mellitus, hyperlipidemia, CKD stage V, hypothyroidism presented with one-week history of progressive shortness of breath and generalized weakness. The patient also complained of increasing abdominal girth and lower extremity edema with symptoms of orthopnea. The patient also has a 12 pound weight gain over the past week despite decreased oral intake.  Clinical Impression   Pt admitted with above diagnosis. Pt currently with functional limitations due to the deficits listed below (see PT Problem List).   Overall managing quite well; Educated pt on effects of bedrest, and encouraged pt to ambulate in hallways multiple times a day;  Pt will benefit from skilled PT to increase their independence and safety with mobility to allow discharge to the venue listed below.  [name] one more session for stair goal, given he has more than a few steps to enter his home, and reported some dyspnea with walking today.     Follow Up Recommendations No PT follow up    Equipment Recommendations  None recommended by PT    Recommendations for Other Services       Precautions / Restrictions Precautions Precautions: None      Mobility  Bed Mobility Overal bed mobility: Independent                Transfers Overall transfer level: Independent Equipment used: None                Ambulation/Gait Ambulation/Gait assistance: Supervision Ambulation Distance (Feet): 120 Feet Assistive device: None Gait Pattern/deviations: Step-through pattern Gait velocity: approaching WNL   General Gait Details: Noted some DOE, cues to self-monitor for activity tolerance; O2 sat 99% on room air  Stairs            Wheelchair Mobility    Modified Rankin (Stroke Patients Only)       Balance  Overall balance assessment: No apparent balance deficits (not formally assessed)                                           Pertinent Vitals/Pain Pain Assessment: No/denies pain    Home Living Family/patient expects to be discharged to:: Private residence Living Arrangements: Spouse/significant other Available Help at Discharge: Family;Available PRN/intermittently Type of Home: House Home Access: Stairs to enter Entrance Stairs-Rails: Right Entrance Stairs-Number of Steps: 7 Home Layout: Two level;Able to live on main level with bedroom/bathroom        Prior Function Level of Independence: Independent               Hand Dominance        Extremity/Trunk Assessment   Upper Extremity Assessment: Overall WFL for tasks assessed           Lower Extremity Assessment: Overall WFL for tasks assessed         Communication   Communication: No difficulties  Cognition Arousal/Alertness: Awake/alert Behavior During Therapy: WFL for tasks assessed/performed Overall Cognitive Status: Within Functional Limits for tasks assessed                      General Comments      Exercises        Assessment/Plan    PT Assessment Patient needs continued PT services  PT  Diagnosis Other (comment) (decr functional capacity)   PT Problem List Cardiopulmonary status limiting activity;Decreased activity tolerance  PT Treatment Interventions Stair training;Gait training   PT Goals (Current goals can be found in the Care Plan section) Acute Rehab PT Goals Patient Stated Goal: did not state PT Goal Formulation: With patient Time For Goal Achievement: 03/01/15 Potential to Achieve Goals: Good    Frequency Min 3X/week (Will likely meet goals next session)   Barriers to discharge        Co-evaluation               End of Session   Activity Tolerance: Patient tolerated treatment well Patient left: in bed;with call bell/phone within reach  (sitting EOB with dietician in room) Nurse Communication: Mobility status         Time: [id number] PT Time Calculation (min) (ACUTE ONLY): 14 min   Charges:   PT Evaluation $Initial PT Evaluation Tier I: 1 Procedure     PT G CodesQuin [name] 02/22/2015, 3:47 PM  [name], [name] Pager [phone] Office [phone]

## 2015-02-22 NOTE — Progress Notes (Signed)
PROGRESS NOTE  [name] [id number] DOB: 12/06/1940 DOA: 02/18/2015 PCP: Elby Showers, MD   Brief History 74 year old male with a history of hypertension, diabetes mellitus, hyperlipidemia, CKD stage V, hypothyroidism presented with one-week history of progressive shortness of breath and generalized weakness. The patient also complained of increasing abdominal girth and lower extremity edema with symptoms of orthopnea. The patient also has a 12 pound weight gain over the past week despite decreased oral intake. There was no fevers, chills, chest pain, nausea, vomiting, diarrhea, abdominal pain. In addition, the patient has had episodes of hypoglycemia. After admission, the patient was noted to be fluid overloaded. The patient was started on intravenous furosemide, and nephrology was consulted. The patient has demonstrated progression of his kidney disease. The patient was initiated on hemodialysis. Assessment/Plan: Generalized weakness -Secondary to progression of CKD stage V and fluid overload -PT evaluation for deconditioning Dyspnea -Secondary to fluid overload, Cor Pulmonale, deconditioning -Echocardiogram--EF 65-70%, mod TR, PAP 60, mild AS, no WMA -Patient was clinically fluid overloaded--improving with HD -No signs of increased work of breathing New ESRD -Appreciate nephrology -Dialysis initiated 02/19/2015--had third tx on 5/31 -CLIP process started -Continue diuretics per nephrololgy -Continue calcitriol and Renvela -improving with HD - Weight on admission 106.5 kg-->100.2kg Diabetes mellitus type 2 -Hypoglycemia at home secondary to sulfonylurea use in the setting of CKD stage V -Discontinue glipizide and Actos for now--likely only needs one agent at d/c -NovoLog sliding scale while the patient is in the hospital -02/14/2015 hemoglobin A1c 6.2 Thrombocytopenia -Chronic, 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 -Monitor for signs of bleeding Hyperkalemia/metabolic  acidosis -Secondary to progression of renal disease -Dialysis initiated-->improved Hypertension -change labetalol to carvedilol -Continue nifedipine -anticipate continued improvement of BP with HD; may need to adjust regimen Hypothyroidism -Continue Synthroid -TSH 2.946 Hyperlipidemia -Continue statin  Family Communication: Wife update at beside 02/21/15 Disposition Plan: Home after CLIP     Procedures/Studies: Dg Chest 2 View  02/18/2015   CLINICAL DATA:  Wheezing and cough since yesterday  EXAM: CHEST  2 VIEW  COMPARISON:  10/05/2014  FINDINGS: Mild cardiac enlargement. A small left pleural effusion is noted. No right effusion. No airspace consolidation. The visualized osseous structures appear intact.  IMPRESSION: 1. Mild cardiac enlargement. 2. Small left pleural effusion peer .   Electronically Signed   By: Kerby Moors M.D.   On: 02/18/2015 18:55         Subjective: Patient denies fevers, chills, headache, chest pain, dyspnea, nausea, vomiting, diarrhea, abdominal pain, dysuria, hematuria   Objective: Filed Vitals:   02/22/15 1030 02/22/15 1100 02/22/15 1130 02/22/15 1156  BP: 161/62 142/62 137/53 150/52  Pulse: 67 66 65 61  Temp:    97.6 F (36.4 C)  TempSrc:    Oral  Resp: 12 14 11 14   Height:      Weight:    100.2 kg (220 lb 14.4 oz)  SpO2:    100%    Intake/Output Summary (Last 24 hours) at 02/22/15 1653 Last data filed at 02/22/15 1531  Gross per 24 hour  Intake   1200 ml  Output   2899 ml  Net  -1699 ml   Weight change: -1.677 kg (-3 lb 11.1 oz) Exam:   General:  Pt is alert, follows commands appropriately, not in acute distress  HEENT: No icterus, No thrush,  [place]/AT  Cardiovascular: RRR, S1/S2, no rubs, no gallops  Respiratory: CTA bilaterally, no wheezing, no crackles, no rhonchi  Abdomen: Soft/+BS, non tender, non distended, no guarding  Extremities: 1+LE edema, No lymphangitis, No petechiae, No rashes, no synovitis  Data  Reviewed: Basic Metabolic Panel:  Recent Labs Lab 02/18/15 1725 02/19/15 0508 02/19/15 1556 02/21/15 0828 02/22/15 0930  NA 135 136 136 137 140  K 5.5* 5.1 4.9 3.7 3.4*  CL 108 107 108 107 104  CO2 19* 16* 18* 22 26  GLUCOSE 40* 50* 115* 117* 170*  BUN 76* 76* 75* 52* 31*  CREATININE 7.15* 7.49* 7.48* 6.25* 4.95*  CALCIUM 9.2 9.1 9.1 9.0 9.0  PHOS  --  7.0* 7.0* 4.9* 3.6   Liver Function Tests:  Recent Labs Lab 02/19/15 0508 02/19/15 1556 02/21/15 0828 02/22/15 0930  ALBUMIN 2.8* 2.7* 2.6* 2.7*   No results for input(s): LIPASE, AMYLASE in the last 168 hours. No results for input(s): AMMONIA in the last 168 hours. CBC:  Recent Labs Lab 02/18/15 1725 02/19/15 0508 02/19/15 1555 02/21/15 0827  WBC 5.2 5.9 5.8 6.7  HGB 8.9* 8.9* 8.7* 8.5*  HCT 27.4* 27.3* 26.8* 25.4*  MCV 100.7* 100.7* 99.6 100.0  PLT 121* 123* 131* 127*   Cardiac Enzymes: No results for input(s): CKTOTAL, CKMB, CKMBINDEX, TROPONINI in the last 168 hours. BNP: Invalid input(s): POCBNP CBG:  Recent Labs Lab 02/21/15 1220 02/21/15 1620 02/21/15 2115 02/22/15 0752 02/22/15 1309  GLUCAP 115* 130* 167* 121* 125*    No results found for this or any previous visit (from the past 240 hour(s)).   Scheduled Meds: . allopurinol  100 mg Oral Daily  . aspirin  650 mg Oral Daily  . calcitRIOL  0.25 mcg Oral Q T,Th,S,Su  . calcitRIOL  0.5 mcg Oral Q M,W,F  . carvedilol  3.125 mg Oral BID WC  . darbepoetin (ARANESP) injection - DIALYSIS  150 mcg Intravenous Q Mon-HD  . feeding supplement (NEPRO CARB STEADY)  237 mL Oral Q1500  . ferric gluconate (FERRLECIT/NULECIT) IV  250 mg Intravenous Q M,W,F-HD  . fluticasone  1 spray Each Nare Daily  . furosemide  80 mg Intravenous 3 times per day  . heparin  5,000 Units Subcutaneous 3 times per day  . insulin aspart  0-9 Units Subcutaneous TID WC  . levothyroxine  50 mcg Oral QAC breakfast  . mupirocin ointment   Topical TID  . NIFEdipine  60 mg Oral  Daily  . sevelamer carbonate  1,600 mg Oral TID WC  . simvastatin  10 mg Oral QHS  . sodium chloride  3 mL Intravenous Q12H  . triamcinolone ointment  1 application Topical BID   Continuous Infusions:    Naeem Quillin, DO  [facility] Pager [phone]  If 7PM-7AM, please contact night-coverage [email or web address] Password [email or web address] 02/22/2015, 4:53 PM   LOS: 4 days

## 2015-02-22 NOTE — Progress Notes (Deleted)
Pt transferred via stretcher by [facility] to [facility] in [place], [place].

## 2015-02-22 NOTE — Plan of Care (Signed)
Problem: Food- and Nutrition-Related Knowledge Deficit (NB-1.1) Goal: Nutrition education Formal process to instruct or train a patient/client in a skill or to impart knowledge to help patients/clients voluntarily manage or modify food choices and eating behavior to maintain or improve health. Outcome: Completed/Met Date Met:  02/22/15 Nutrition Education Note  RD consulted for Renal Education. Provided Choose-A-Meal Booklet to patient. Reviewed food groups and provided written recommended serving sizes specifically determined for patient's current nutritional status.   Explained why diet restrictions are needed and provided lists of foods to limit/avoid that are high potassium, sodium, and phosphorus. Provided specific recommendations on safer alternatives of these foods. Strongly encouraged compliance of this diet.   Discussed importance of protein intake at each meal and snack. Provided examples of how to maximize protein intake throughout the day. Discussed examples of renal friendly meal ideas. Discussed need for fluid restriction with dialysis and renal-friendly beverage options. Teach back method used.  Expect good compliance.  Kallie Locks, MS, RD, LDN Pager # [phone] After hours/ weekend pager # [phone]

## 2015-02-22 NOTE — Progress Notes (Signed)
PT Cancellation Note  Patient Details Name: [name] MRN: [id number] DOB: Apr 18, 1941   Cancelled Treatment:    Reason Eval/Treat Not Completed: Patient at procedure or test/unavailable   Currently in HD;  Will follow up later today as time allows;  Otherwise, will follow up for PT tomorrow;   Thank you,  Roney Marion, [place] Pager [phone] Office [phone]     Roney Marion [facility] 02/22/2015, 11:24 AM

## 2015-02-22 NOTE — Progress Notes (Signed)
Patient ID: [name], male   DOB: Feb 04, 1941, 74 y.o.   MRN: [id number]  [facility] Progress Note    Assessment/ Plan:   1. 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 ESRD: Third treatment of hemodialysis today-process initiated for outpatient dialysis unit placement. Continue to monitor blood pressures/heart rate while on dialysis for adjustment of dry weight 3. Anemia: Low hemoglobin-on Aranesp/intravenous iron, continue to monitor trend 4. CKD-MBD: Continue Renvela 3 times a day with meals for phosphorus binding and calcitriol for PTH suppression 5. Nutrition: Appetite somewhat better, continue nutritional supplements 6. Hypertension: Blood pressures are acceptable, continue ultrafiltration with hemodialysis and nifedipine/labetalol  Subjective:   Reports to have had a much better night-shortness of breath better. Still had some problems falling asleep.    Objective:   BP 155/49 mmHg  Pulse 65  Temp(Src) 98.5 F (36.9 C) (Oral)  Resp 18  Ht 5\' 11"  ([id number] m)  Wt 102.5 kg (225 lb 15.5 oz)  BMI 31.53 kg/m2  SpO2 95%  Intake/Output Summary (Last 24 hours) at 02/22/15 0908 Last data filed at 02/22/15 [id number]  Gross per 24 hour  Intake   1440 ml  Output   2725 ml  Net  -1285 ml   Weight change: -1.677 kg (-3 lb 11.1 oz)  Physical Exam: Gen: Comfortable resting on dialysis CVS: Pulse regular in rate and rhythm Resp: Clear to auscultation-anteriorly no rales Abd: Soft, obese, nontender Ext: Trace to 1+ lower extremity edema  Imaging: No results found.  Labs: BMET  Recent Labs Lab 02/18/15 1725 02/19/15 0508 02/19/15 1556 02/21/15 0828  NA 135 136 136 137  K 5.5* 5.1 4.9 3.7  CL 108 107 108 107  CO2 19* 16* 18* 22  GLUCOSE 40* 50* 115* 117*  BUN 76* 76* 75* 52*  CREATININE 7.15* 7.49* 7.48* 6.25*  CALCIUM 9.2 9.1 9.1 9.0   PHOS  --  7.0* 7.0* 4.9*   CBC  Recent Labs Lab 02/18/15 1725 02/19/15 0508 02/19/15 1555 02/21/15 0827  WBC 5.2 5.9 5.8 6.7  HGB 8.9* 8.9* 8.7* 8.5*  HCT 27.4* 27.3* 26.8* 25.4*  MCV 100.7* 100.7* 99.6 100.0  PLT 121* 123* 131* 127*   Medications:    . allopurinol  100 mg Oral Daily  . aspirin  650 mg Oral Daily  . calcitRIOL  0.25 mcg Oral Q T,Th,S,Su  . calcitRIOL  0.5 mcg Oral Q M,W,F  . carvedilol  3.125 mg Oral BID WC  . darbepoetin (ARANESP) injection - DIALYSIS  150 mcg Intravenous Q Mon-HD  . ferric gluconate (FERRLECIT/NULECIT) IV  250 mg Intravenous Q M,W,F-HD  . fluticasone  1 spray Each Nare Daily  . furosemide  80 mg Intravenous 3 times per day  . heparin  5,000 Units Subcutaneous 3 times per day  . insulin aspart  0-9 Units Subcutaneous TID WC  . levothyroxine  50 mcg Oral QAC breakfast  . mupirocin ointment   Topical TID  . NIFEdipine  60 mg Oral Daily  . sevelamer carbonate  1,600 mg Oral TID WC  . simvastatin  10 mg Oral QHS  . sodium chloride  3 mL Intravenous Q12H  . triamcinolone ointment  1 application Topical BID   Elmarie Shiley, MD 02/22/2015, 9:08 AM

## 2015-02-23 LAB — GLUCOSE, CAPILLARY
Glucose-Capillary: 106 mg/dL — ABNORMAL HIGH (ref 65–99)
Glucose-Capillary: 147 mg/dL — ABNORMAL HIGH (ref 65–99)
Glucose-Capillary: 213 mg/dL — ABNORMAL HIGH (ref 65–99)
Glucose-Capillary: 232 mg/dL — ABNORMAL HIGH (ref 65–99)

## 2015-02-23 MED ORDER — PREDNISONE 50 MG PO TABS
50.0000 mg | ORAL_TABLET | Freq: Every day | ORAL | Status: DC
  Administered 2015-02-23: 50 mg via ORAL
  Filled 2015-02-23 (×3): qty 1

## 2015-02-23 MED ORDER — COLCHICINE 0.6 MG PO TABS
0.6000 mg | ORAL_TABLET | Freq: Once | ORAL | Status: AC
Start: 2015-02-23 — End: 2015-02-23
  Administered 2015-02-23: 0.6 mg via ORAL
  Filled 2015-02-23: qty 1

## 2015-02-23 MED ORDER — DEXTROSE 50 % IV SOLN
INTRAVENOUS | Status: AC
  Filled 2015-02-23: qty 50

## 2015-02-23 MED ORDER — OXYCODONE HCL 5 MG PO TABS
5.0000 mg | ORAL_TABLET | Freq: Once | ORAL | Status: AC
  Administered 2015-02-23: 5 mg via ORAL
  Filled 2015-02-23: qty 1

## 2015-02-23 MED ORDER — HYDROMORPHONE HCL 1 MG/ML IJ SOLN
1.0000 mg | INTRAMUSCULAR | Status: DC | PRN
  Administered 2015-02-23 (×2): 1 mg via INTRAVENOUS
  Filled 2015-02-23 (×2): qty 1

## 2015-02-23 NOTE — Progress Notes (Signed)
02/23/2015 3:29 PM Hemodialysis Outpatient Note; this patient has been accepted at the [facility] on a Monday, Wednesday and Friday 2nd shift schedule. The center can begin treatment on Friday June 3rd at 11:30 AM. Thank you. Gordy Savers

## 2015-02-23 NOTE — Progress Notes (Signed)
Patient ID: [name], male   DOB: Sep 01, 1941, 74 y.o.   MRN: [id number]   [facility] Note   Assessment/ Plan:   1. Generalized weakness/dyspnea: secondary to uremia from progressive chronic kidney disease now end-stage renal disease-hemodialysis started 2. ESRD: Will order hemodialysis for tomorrow-the process was initiated for outpatient dialysis unit placement to facilitate discharge. Using a right upper arm fistula without problems. 3. Anemia: Low hemoglobin-on Aranesp/intravenous iron, continue to monitor trend 4. CKD-MBD: Continue Renvela 3 times a day with meals for phosphorus binding and calcitriol for PTH suppression 5. Nutrition: Appetite somewhat better, continue nutritional supplements 6. Hypertension: Blood pressures are acceptable, continue ultrafiltration with hemodialysis and nifedipine/labetalol  Subjective:   Reports some right hallux pain and insomnia overnight.    Objective:   BP 167/52 mmHg  Pulse 70  Temp(Src) 98.2 F (36.8 C) (Oral)  Resp 18  Ht 5\' 11"  (1.803 m)  Wt 100.2 kg (220 lb 14.4 oz)  BMI 30.82 kg/m2  SpO2 98%  Physical Exam: Gen: Comfortably resting in bed CVS: Pulse regular in rate and rhythm Resp: Clear to auscultation, no rales Abd: Soft, flat, nontender Ext: 1+ lower extremity edema, functional right upper arm AV fistula. Abandoned left upper arm AV fistula (that still works)  Labs: [employer]  02/18/15 1725 02/19/15 0508 02/19/15 1556 02/21/15 0828 02/22/15 0930  NA 135 136 136 137 140  K 5.5* 5.1 4.9 3.7 3.4*  CL 108 107 108 107 104  CO2 19* 16* 18* 22 26  GLUCOSE 40* 50* 115* 117* 170*  BUN 76* 76* 75* 52* 31*  CREATININE 7.15* 7.49* 7.48* 6.25* 4.95*  CALCIUM 9.2 9.1 9.1 9.0 9.0  PHOS  --  7.0* 7.0* 4.9* 3.6   CBC  Recent Labs Lab 02/18/15 1725 02/19/15 0508 02/19/15 1555 02/21/15 0827  WBC 5.2 5.9 5.8 6.7  HGB 8.9* 8.9* 8.7* 8.5*  HCT 27.4* 27.3* 26.8* 25.4*  MCV 100.7*  100.7* 99.6 100.0  PLT 121* 123* 131* 127*   Medications:    . allopurinol  100 mg Oral Daily  . aspirin  650 mg Oral Daily  . calcitRIOL  0.25 mcg Oral Q T,Th,S,Su  . calcitRIOL  0.5 mcg Oral Q M,W,F  . carvedilol  3.125 mg Oral BID WC  . colchicine  0.6 mg Oral Once  . darbepoetin (ARANESP) injection - DIALYSIS  150 mcg Intravenous Q Mon-HD  . feeding supplement (NEPRO CARB STEADY)  237 mL Oral Q1500  . ferric gluconate (FERRLECIT/NULECIT) IV  250 mg Intravenous Q M,W,F-HD  . fluticasone  1 spray Each Nare Daily  . furosemide  80 mg Intravenous 3 times per day  . heparin  5,000 Units Subcutaneous 3 times per day  . insulin aspart  0-9 Units Subcutaneous TID WC  . levothyroxine  50 mcg Oral QAC breakfast  . mupirocin ointment   Topical TID  . NIFEdipine  90 mg Oral Daily  . predniSONE  50 mg Oral Q breakfast  . sevelamer carbonate  1,600 mg Oral TID WC  . simvastatin  10 mg Oral QHS  . sodium chloride  3 mL Intravenous Q12H  . triamcinolone ointment  1 application Topical BID   Elmarie Shiley, MD 02/23/2015, 9:38 AM

## 2015-02-23 NOTE — Progress Notes (Signed)
Brief Nutrition Note  Wife was educated on a renal diet education as she is the sole provider for food for the patient. Discussed and reviewed the  Choose-A-Meal Booklet. Explained why diet restrictions are needed and provided lists of foods to limit/avoid that are high potassium, sodium, and phosphorus. Provided specific recommendations on safer alternatives. Discussed need for fluid restriction with dialysis and renal-friendly beverage options. Additionally reviewed the fundamentals of a diabetic diet.  Kallie Locks, MS, RD, LDN Pager # [phone] After hours/ weekend pager # [phone]

## 2015-02-23 NOTE — Progress Notes (Signed)
PROGRESS NOTE  FRANCESCO MILLEDGE [id number] DOB: April 05, 1941 DOA: 02/18/2015 PCP: Elby Showers, MD   Brief History 74 year old male with a history of hypertension, diabetes mellitus, hyperlipidemia, CKD stage V, hypothyroidism presented with one-week history of progressive shortness of breath and generalized weakness. The patient also complained of increasing abdominal girth and lower extremity edema with symptoms of orthopnea. The patient also has a 12 pound weight gain over the past week despite decreased oral intake. There was no fevers, chills, chest pain, nausea, vomiting, diarrhea, abdominal pain. In addition, the patient has had episodes of hypoglycemia. After admission, the patient was noted to be fluid overloaded. The patient was started on intravenous furosemide, and nephrology was consulted. The patient has demonstrated progression of his kidney disease. The patient was initiated on hemodialysis. Assessment/Plan: Generalized weakness -Secondary to progression of CKD stage V and fluid overload -improved  Dyspnea -Secondary to fluid overload, Cor Pulmonale, deconditioning -Echocardiogram--EF 65-70%, mod TR, PAP 60, mild AS, no WMA -Patient was clinically fluid overloaded--improving with HD  New ESRD -Appreciate nephrology -Dialysis initiated 02/19/2015--had third tx on 5/31 -CLIP process started -Continue diuretics per nephrololgy -Continue calcitriol and Renvela -improving with HD - Weight on admission 106.5 kg-->100.2kg  Diabetes mellitus type 2 -Hypoglycemia at home secondary to sulfonylurea use in the setting of CKD stage V -Discontinue glipizide and Actos for now--likely only needs one agent at d/c -NovoLog sliding scale while the patient is in the hospital -02/14/2015 hemoglobin A1c 6.2  Gout flare 6/1 -add colchicine and Prednisone  Thrombocytopenia -Chronic, 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 -outpatient workup  Hyperkalemia/metabolic acidosis -Secondary  to progression of renal disease -Dialysis initiated-->improved  Hypertension -change labetalol to carvedilol -Continue nifedipine -anticipate continued improvement of BP with HD; may need to adjust regimen  Hypothyroidism -Continue Synthroid -TSH 2.946  Hyperlipidemia -Continue statin  Full Code Family Communication: Wife update at beside 02/21/15 Disposition Plan: home tomorrow if CLIP compelted     Procedures/Studies: Dg Chest 2 View  02/18/2015   CLINICAL DATA:  Wheezing and cough since yesterday  EXAM: CHEST  2 VIEW  COMPARISON:  10/05/2014  FINDINGS: Mild cardiac enlargement. A small left pleural effusion is noted. No right effusion. No airspace consolidation. The visualized osseous structures appear intact.  IMPRESSION: 1. Mild cardiac enlargement. 2. Small left pleural effusion peer .   Electronically Signed   By: Kerby Moors M.D.   On: 02/18/2015 18:55        Subjective: R foot great toe swelling and pain   Objective: Filed Vitals:   02/22/15 1803 02/22/15 2110 02/23/15 0504 02/23/15 0815  BP: 174/58 181/48 165/59 167/52  Pulse: 58 63 64 70  Temp:  98.3 F (36.8 C) 97.5 F (36.4 C) 98.2 F (36.8 C)  TempSrc:  Oral Oral Oral  Resp:  19 18 18   Height:      Weight:      SpO2:  97% 97% 98%    Intake/Output Summary (Last 24 hours) at 02/23/15 0900 Last data filed at 02/23/15 0804  Gross per 24 hour  Intake   1080 ml  Output   3024 ml  Net  -1944 ml   Weight change: -3 kg (-6 lb 9.8 oz) Exam:   General:  Pt is alert, follows commands appropriately, not in acute distress  HEENT: No icterus, No thrush,  [place]/AT  Cardiovascular: RRR, S1/S2, no rubs, no gallops  Respiratory: CTA bilaterally, no wheezing, no crackles, no rhonchi  Abdomen: Soft/+BS, non tender, non distended, no guarding  Extremities: 1+LE edema, Swelling and tenderness of R foot MTP joint  Data Reviewed: Basic Metabolic Panel:  Recent Labs Lab 02/18/15 1725  02/19/15 0508 02/19/15 1556 02/21/15 0828 02/22/15 0930  NA 135 136 136 137 140  K 5.5* 5.1 4.9 3.7 3.4*  CL 108 107 108 107 104  CO2 19* 16* 18* 22 26  GLUCOSE 40* 50* 115* 117* 170*  BUN 76* 76* 75* 52* 31*  CREATININE 7.15* 7.49* 7.48* 6.25* 4.95*  CALCIUM 9.2 9.1 9.1 9.0 9.0  PHOS  --  7.0* 7.0* 4.9* 3.6   Liver Function Tests:  Recent Labs Lab 02/19/15 0508 02/19/15 1556 02/21/15 0828 02/22/15 0930  ALBUMIN 2.8* 2.7* 2.6* 2.7*   No results for input(s): LIPASE, AMYLASE in the last 168 hours. No results for input(s): AMMONIA in the last 168 hours. CBC:  Recent Labs Lab 02/18/15 1725 02/19/15 0508 02/19/15 1555 02/21/15 0827  WBC 5.2 5.9 5.8 6.7  HGB 8.9* 8.9* 8.7* 8.5*  HCT 27.4* 27.3* 26.8* 25.4*  MCV 100.7* 100.7* 99.6 100.0  PLT 121* 123* 131* 127*   Cardiac Enzymes: No results for input(s): CKTOTAL, CKMB, CKMBINDEX, TROPONINI in the last 168 hours. BNP: Invalid input(s): POCBNP CBG:  Recent Labs Lab 02/21/15 2115 02/22/15 0752 02/22/15 1309 02/22/15 1705 02/23/15 0756  GLUCAP 167* 121* 125* 139* 106*    No results found for this or any previous visit (from the past 240 hour(s)).   Scheduled Meds: . allopurinol  100 mg Oral Daily  . aspirin  650 mg Oral Daily  . calcitRIOL  0.25 mcg Oral Q T,Th,S,Su  . calcitRIOL  0.5 mcg Oral Q M,W,F  . carvedilol  3.125 mg Oral BID WC  . darbepoetin (ARANESP) injection - DIALYSIS  150 mcg Intravenous Q Mon-HD  . feeding supplement (NEPRO CARB STEADY)  237 mL Oral Q1500  . ferric gluconate (FERRLECIT/NULECIT) IV  250 mg Intravenous Q M,W,F-HD  . fluticasone  1 spray Each Nare Daily  . furosemide  80 mg Intravenous 3 times per day  . heparin  5,000 Units Subcutaneous 3 times per day  . insulin aspart  0-9 Units Subcutaneous TID WC  . levothyroxine  50 mcg Oral QAC breakfast  . mupirocin ointment   Topical TID  . NIFEdipine  90 mg Oral Daily  . sevelamer carbonate  1,600 mg Oral TID WC  . simvastatin   10 mg Oral QHS  . sodium chloride  3 mL Intravenous Q12H  . triamcinolone ointment  1 application Topical BID   Continuous Infusions:    Domenic Polite, MD  [facility] Pager [phone]  If 7PM-7AM, please contact night-coverage [email or web address] Password [email or web address] 02/23/2015, 9:00 AM   LOS: 5 days

## 2015-02-24 LAB — RENAL FUNCTION PANEL
Albumin: 2.6 g/dL — ABNORMAL LOW (ref 3.5–5.0)
Anion gap: 10 (ref 5–15)
BUN: 21 mg/dL — ABNORMAL HIGH (ref 6–20)
CO2: 26 mmol/L (ref 22–32)
Calcium: 8.9 mg/dL (ref 8.9–10.3)
Chloride: 101 mmol/L (ref 101–111)
Creatinine, Ser: 3.57 mg/dL — ABNORMAL HIGH (ref 0.61–1.24)
GFR calc Af Amer: 18 mL/min — ABNORMAL LOW (ref >60)
GFR calc non Af Amer: 16 mL/min — ABNORMAL LOW (ref >60)
Glucose, Bld: 177 mg/dL — ABNORMAL HIGH (ref 65–99)
Phosphorus: 3.1 mg/dL (ref 2.5–4.6)
Potassium: 3.6 mmol/L (ref 3.5–5.1)
Sodium: 137 mmol/L (ref 135–145)

## 2015-02-24 LAB — GLUCOSE, CAPILLARY
Glucose-Capillary: 172 mg/dL — ABNORMAL HIGH (ref 65–99)
Glucose-Capillary: 132 mg/dL — ABNORMAL HIGH (ref 65–99)

## 2015-02-24 LAB — HEPATITIS B CORE ANTIBODY, TOTAL: Hep B Core Total Ab: NONREACTIVE

## 2015-02-24 LAB — CBC
HCT: 28.5 % — ABNORMAL LOW (ref 39.0–52.0)
Hemoglobin: 9.5 g/dL — ABNORMAL LOW (ref 13.0–17.0)
MCH: 33.8 pg (ref 26.0–34.0)
MCHC: 33.3 g/dL (ref 30.0–36.0)
MCV: 101.4 fL — ABNORMAL HIGH (ref 78.0–100.0)
Platelets: 97 10*3/uL — ABNORMAL LOW (ref 150–400)
RBC: 2.81 MIL/uL — ABNORMAL LOW (ref 4.22–5.81)
RDW: 16.4 % — ABNORMAL HIGH (ref 11.5–15.5)
WBC: 8.4 10*3/uL (ref 4.0–10.5)

## 2015-02-24 MED ORDER — NIFEDIPINE ER 60 MG PO TB24: 60.0000 mg | ORAL_TABLET | Freq: Every day | ORAL | Status: DC

## 2015-02-24 MED ORDER — GLIPIZIDE 5 MG PO TABS: 5.0000 mg | ORAL_TABLET | Freq: Every day | ORAL | Status: DC

## 2015-02-24 MED ORDER — HEPARIN SODIUM (PORCINE) 1000 UNIT/ML DIALYSIS: 2500.0000 [IU] | INTRAMUSCULAR | Status: DC | PRN

## 2015-02-24 MED ORDER — CARVEDILOL 3.125 MG PO TABS: 3.1250 mg | ORAL_TABLET | Freq: Two times a day (BID) | ORAL | Status: DC

## 2015-02-24 MED ORDER — SODIUM CHLORIDE 0.9 % IV SOLN
250.0000 mg | INTRAVENOUS | Status: AC
  Administered 2015-02-24: 250 mg via INTRAVENOUS
  Filled 2015-02-24: qty 20

## 2015-02-24 MED ORDER — HEPARIN SODIUM (PORCINE) 1000 UNIT/ML DIALYSIS: 40.0000 [IU]/kg | INTRAMUSCULAR | Status: DC | PRN

## 2015-02-24 NOTE — Progress Notes (Signed)
Patient ID: [name], male   DOB: 1941-07-19, 74 y.o.   MRN: [id number]  [facility] Progress Note   Assessment/ Plan:   1. Generalized weakness/dyspnea: secondary to uremia from progressive chronic kidney disease now end-stage renal disease-hemodialysis started 2. ESRD: DC home after HD today to resume MWF schedule at [facility]- presumed EDW 96kg and will continue to lower as OP. Using a right upper arm fistula without problems. 3. Anemia: improving hemoglobin-on Aranesp/intravenous iron, continue to monitor trend 4. CKD-MBD: Continue Renvela 3 times a day with meals for phosphorus binding and calcitriol for PTH suppression 5. Nutrition: Appetite somewhat better, continue nutritional supplements 6. Hypertension: Blood pressures are acceptable, continue ultrafiltration with hemodialysis and decreased nifedipine dose  Subjective:   Reports to be feeling fair- denies CP/SOB   Objective:   BP 103/73 mmHg  Pulse 67  Temp(Src) 98.3 F (36.8 C) (Oral)  Resp 17  Ht 5\' 11"  (1.803 m)  Wt 98.6 kg (217 lb 6 oz)  BMI 30.33 kg/m2  SpO2 99%  Physical Exam: [id number] on HD [id number] RRR, normal s1 and s2 Resp:CTA bilaterally, no rales/rhonchi [id number], obese, NT, BS normal Ext:1+ LE edema  Labs: BMET  Recent Labs Lab 02/18/15 1725 02/19/15 0508 02/19/15 1556 02/21/15 0828 02/22/15 0930 02/24/15 0900  NA 135 136 136 137 140 137  K 5.5* 5.1 4.9 3.7 3.4* 3.6  CL 108 107 108 107 104 101  CO2 19* 16* 18* 22 26 26   GLUCOSE 40* 50* 115* 117* 170* 177*  BUN 76* 76* 75* 52* 31* 21*  CREATININE 7.15* 7.49* 7.48* 6.25* 4.95* 3.57*  CALCIUM 9.2 9.1 9.1 9.0 9.0 8.9  PHOS  --  7.0* 7.0* 4.9* 3.6 3.1   CBC  Recent Labs Lab 02/19/15 0508 02/19/15 1555 02/21/15 0827 02/24/15 0900  WBC 5.9 5.8 6.7 8.4  HGB 8.9* 8.7* 8.5* 9.5*  HCT 27.3* 26.8* 25.4* 28.5*  MCV 100.7* 99.6 100.0 101.4*  PLT 123* 131* 127* 97*    Medications:    . allopurinol  100 mg Oral Daily   . aspirin  650 mg Oral Daily  . calcitRIOL  0.5 mcg Oral Q M,W,F  . carvedilol  3.125 mg Oral BID WC  . darbepoetin (ARANESP) injection - DIALYSIS  150 mcg Intravenous Q Mon-HD  . feeding supplement (NEPRO CARB STEADY)  237 mL Oral Q1500  . ferric gluconate (FERRLECIT/NULECIT) IV  250 mg Intravenous Q M,W,F-HD  . fluticasone  1 spray Each Nare Daily  . heparin  5,000 Units Subcutaneous 3 times per day  . insulin aspart  0-9 Units Subcutaneous TID WC  . levothyroxine  50 mcg Oral QAC breakfast  . mupirocin ointment   Topical TID  . [START ON 02/25/2015] NIFEdipine  60 mg Oral Daily  . predniSONE  50 mg Oral Q breakfast  . sevelamer carbonate  1,600 mg Oral TID WC  . simvastatin  10 mg Oral QHS  . sodium chloride  3 mL Intravenous Q12H  . triamcinolone ointment  1 application Topical BID   Elmarie Shiley, MD 02/24/2015, 10:51 AM

## 2015-02-24 NOTE — Procedures (Signed)
Patient seen on Hemodialysis. QB 350, UF goal 3.5L Treatment adjusted as needed.  Elmarie Shiley MD [facility]. Office # [phone] Pager # [phone] 10:54 AM

## 2015-02-24 NOTE — Discharge Summary (Signed)
Physician Discharge Summary  [name] [id number] DOB: February 24, 1941 DOA: 02/18/2015  PCP: Elby Showers, MD  Admit date: 02/18/2015 Discharge date: 02/24/2015  Time spent: 45 minutes  Recommendations for Outpatient Follow-up:  1. Outpatient HD Friday 6/3 2. PCP Dr.Baxley in 1 week, has chronic thrombocytopenia needs FU and workup  Discharge Diagnoses:    Progressive CKD 5    Volume overload   Diabetes mellitus type 2 with complications   Essential hypertension   Hypothyroidism   End stage renal disease   Chronic kidney disease (CKD), stage IV (severe)   Hyperkalemia   Weakness   Hypoglycemia   Discharge Condition: stable  Diet recommendation: Renal  Filed Weights   02/22/15 1156 02/23/15 2041 02/24/15 0820  Weight: 100.2 kg (220 lb 14.4 oz) 100.3 kg (221 lb 1.9 oz) 98.6 kg (217 lb 6 oz)    History of present illness:  74 year old male with a history of hypertension, diabetes mellitus, hyperlipidemia, CKD stage V, hypothyroidism presented with one-week history of progressive shortness of breath and generalized weakness. The patient also complained of increasing abdominal girth and lower extremity edema with symptoms of orthopnea. The patient also has a 12 pound weight gain over the past week despite decreased oral intake. There was no fevers, chills, chest pain, nausea, vomiting, diarrhea, abdominal pain. In addition, the patient has had episodes of hypoglycemia. After admission, the patient was noted to be fluid overloaded. T  Hospital Course:   Generalized weakness -Secondary to progression of CKD stage V and fluid overload -improved  Dyspnea -Secondary to fluid overload, Cor Pulmonale, deconditioning -Echocardiogram--EF 65-70%, mod TR, PAP 60, mild AS, no WMA -Patient was clinically fluid overloaded on admisson, started on High dose IV lasix and susequently started HD this admission with improvement. -VOlume managed with HD now  New ESRD -secondary to uremia  from progressive chronic kidney disease now end-stage renal disease -Followed by nephrology -Dialysis initiated 02/19/2015--had third tx on 5/31 -CLIP process completed -Continue calcitriol and Renvela -improving with HD - Weight on admission 106.5 kg-->98.6 at discharge today  Diabetes mellitus type 2 -Hypoglycemia at home secondary to sulfonylurea use in the setting of CKD stage V -Discontinue Actos,  for now only continue Glipizide, this may need to be stopped down the road if he has any hypoglycemic episodes -NovoLog sliding scale used while the patient is in the hospital -02/14/2015 hemoglobin A1c 6.2  Gout flare 6/1 -treated with colchicine and prednisone  Thrombocytopenia -Chronic, 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 - needs outpatient workup for this  Hyperkalemia/metabolic acidosis -Secondary to progression of renal disease -Dialysis initiated-->improved  Hypertension -changed labetalol to carvedilol -Continue nifedipine -improved with initiation of HD  Hypothyroidism -Continue Synthroid -TSH 2.946  Hyperlipidemia -Continue statin   Consultations:  Renal  Discharge Exam: Filed Vitals:   02/24/15 1030  BP: 103/73  Pulse: 67  Temp:   Resp:     General: AAOx3 Cardiovascular: S1S2/RRR Respiratory: CTAB  Discharge Instructions   Discharge Instructions    Discharge instructions    Complete by:  As directed   Renal Diabetic Diet     Increase activity slowly    Complete by:  As directed           Current Discharge Medication List    START taking these medications   Details  carvedilol (COREG) 3.125 MG tablet Take 1 tablet (3.125 mg total) by mouth 2 (two) times daily with a meal. Qty: 30 tablet, Refills: 0      CONTINUE these medications  which have CHANGED   Details  glipiZIDE (GLUCOTROL) 5 MG tablet Take 1 tablet (5 mg total) by mouth daily before breakfast. Qty: 30 tablet, Refills: 0      CONTINUE these medications which have NOT CHANGED    Details  acetaminophen (TYLENOL) 500 MG tablet Take 500 mg by mouth every 6 (six) hours as needed.    allopurinol (ZYLOPRIM) 100 MG tablet Take 100 mg by mouth daily.      aspirin 325 MG tablet Take 650 mg by mouth daily.     calcitRIOL (ROCALTROL) 0.25 MCG capsule Take 0.25-0.5 mcg by mouth See admin instructions. Takes 2 capsules on Mon, Wed and Fri Takes 1 capsule all other days    clobetasol ointment (TEMOVATE) [id number] % Apply 1 application topically 2 (two) times daily. Sores on head Qty: 30 g, Refills: 2    fluticasone (FLONASE) 50 MCG/ACT nasal spray Place 1 spray into both nostrils daily. Qty: 16 g, Refills: 2    levothyroxine (SYNTHROID, LEVOTHROID) 50 MCG tablet Take 1 tablet (50 mcg total) by mouth daily. Qty: 90 tablet, Refills: 1    loratadine (CLARITIN) 10 MG tablet Take 10 mg by mouth daily as needed for allergies.     mupirocin ointment (BACTROBAN) 2 % Use on legs bid Qty: 22 g, Refills: 0    NIFEdipine (PROCARDIA-XL/ADALAT CC) 60 MG 24 hr tablet Take 60 mg by mouth daily.      RENVELA 800 MG tablet Take 800 mg by mouth 3 (three) times daily with meals.  Refills: 0    simvastatin (ZOCOR) 20 MG tablet Take 10 mg by mouth at bedtime.     triamcinolone ointment (KENALOG) 0.1 % Apply 1 application topically 2 (two) times daily. Use between thighs    docusate sodium 100 MG CAPS Take 100 mg by mouth 2 (two) times daily. Qty: 10 capsule, Refills: 0      STOP taking these medications     furosemide (LASIX) 80 MG tablet      labetalol (NORMODYNE) 200 MG tablet      pioglitazone (ACTOS) 45 MG tablet        Allergies  Allergen Reactions  . Penicillins Rash      The results of significant diagnostics from this hospitalization (including imaging, microbiology, ancillary and laboratory) are listed below for reference.    Significant Diagnostic Studies: Dg Chest 2 View  02/18/2015   CLINICAL DATA:  Wheezing and cough since yesterday  EXAM: CHEST  2 VIEW   COMPARISON:  10/05/2014  FINDINGS: Mild cardiac enlargement. A small left pleural effusion is noted. No right effusion. No airspace consolidation. The visualized osseous structures appear intact.  IMPRESSION: 1. Mild cardiac enlargement. 2. Small left pleural effusion peer .   Electronically Signed   By: Kerby Moors M.D.   On: 02/18/2015 18:55    Microbiology: No results found for this or any previous visit (from the past 240 hour(s)).   Labs: Basic Metabolic Panel:  Recent Labs Lab 02/19/15 0508 02/19/15 1556 02/21/15 0828 02/22/15 0930 02/24/15 0900  NA 136 136 137 140 137  K 5.1 4.9 3.7 3.4* 3.6  CL 107 108 107 104 101  CO2 16* 18* 22 26 26   GLUCOSE 50* 115* 117* 170* 177*  BUN 76* 75* 52* 31* 21*  CREATININE 7.49* 7.48* 6.25* 4.95* 3.57*  CALCIUM 9.1 9.1 9.0 9.0 8.9  PHOS 7.0* 7.0* 4.9* 3.6 3.1   Liver Function Tests:  Recent Labs Lab 02/19/15 0508 02/19/15 1556 02/21/15  [id number] 02/22/15 0930 02/24/15 0900  ALBUMIN 2.8* 2.7* 2.6* 2.7* 2.6*   No results for input(s): LIPASE, AMYLASE in the last 168 hours. No results for input(s): AMMONIA in the last 168 hours. CBC:  Recent Labs Lab 02/18/15 1725 02/19/15 0508 02/19/15 1555 02/21/15 0827 02/24/15 0900  WBC 5.2 5.9 5.8 6.7 8.4  HGB 8.9* 8.9* 8.7* 8.5* 9.5*  HCT 27.4* 27.3* 26.8* 25.4* 28.5*  MCV 100.7* 100.7* 99.6 100.0 101.4*  PLT 121* 123* 131* 127* 97*   Cardiac Enzymes: No results for input(s): CKTOTAL, CKMB, CKMBINDEX, TROPONINI in the last 168 hours. BNP: BNP (last 3 results)  Recent Labs  10/05/14 1441 02/18/15 1725  BNP 862.2* 779.0*    ProBNP (last 3 results) No results for input(s): PROBNP in the last 8760 hours.  CBG:  Recent Labs Lab 02/23/15 0756 02/23/15 1134 02/23/15 1655 02/23/15 2039 02/24/15 0756  GLUCAP 106* 147* 213* 232* 172*       Signed:  Jozy Mcphearson  [facility] 02/24/2015, 10:55 AM

## 2015-02-24 NOTE — Progress Notes (Signed)
Pt and wife provided with discharge instruction. Pt verbalized understanding of where and when he is to be at new dialysis center. Pt also verbalized understanding of follow up appointments and medications. IV dc'd without complication. VSS. Pt escorted out via wheelchair by NT.   Tyna Jaksch, RN

## 2015-02-25 DIAGNOSIS — R197 Diarrhea, unspecified: Secondary | ICD-10-CM | POA: Insufficient documentation

## 2015-02-25 DIAGNOSIS — R52 Pain, unspecified: Secondary | ICD-10-CM | POA: Insufficient documentation

## 2015-02-25 DIAGNOSIS — Z23 Encounter for immunization: Secondary | ICD-10-CM | POA: Diagnosis not present

## 2015-02-25 DIAGNOSIS — D631 Anemia in chronic kidney disease: Secondary | ICD-10-CM | POA: Diagnosis not present

## 2015-02-25 DIAGNOSIS — E1129 Type 2 diabetes mellitus with other diabetic kidney complication: Secondary | ICD-10-CM | POA: Diagnosis not present

## 2015-02-25 DIAGNOSIS — D509 Iron deficiency anemia, unspecified: Secondary | ICD-10-CM | POA: Insufficient documentation

## 2015-02-25 DIAGNOSIS — N186 End stage renal disease: Secondary | ICD-10-CM | POA: Diagnosis not present

## 2015-02-25 DIAGNOSIS — E875 Hyperkalemia: Secondary | ICD-10-CM | POA: Diagnosis not present

## 2015-02-25 DIAGNOSIS — M109 Gout, unspecified: Secondary | ICD-10-CM | POA: Insufficient documentation

## 2015-02-25 DIAGNOSIS — R509 Fever, unspecified: Secondary | ICD-10-CM | POA: Insufficient documentation

## 2015-02-25 DIAGNOSIS — I82B12 Acute embolism and thrombosis of left subclavian vein: Secondary | ICD-10-CM | POA: Insufficient documentation

## 2015-02-25 DIAGNOSIS — D649 Anemia, unspecified: Secondary | ICD-10-CM | POA: Insufficient documentation

## 2015-02-25 DIAGNOSIS — D688 Other specified coagulation defects: Secondary | ICD-10-CM | POA: Insufficient documentation

## 2015-02-25 DIAGNOSIS — R519 Headache, unspecified: Secondary | ICD-10-CM

## 2015-02-25 DIAGNOSIS — L299 Pruritus, unspecified: Secondary | ICD-10-CM | POA: Insufficient documentation

## 2015-02-25 DIAGNOSIS — N2581 Secondary hyperparathyroidism of renal origin: Secondary | ICD-10-CM | POA: Diagnosis not present

## 2015-02-25 DIAGNOSIS — E785 Hyperlipidemia, unspecified: Secondary | ICD-10-CM | POA: Insufficient documentation

## 2015-02-25 DIAGNOSIS — N189 Chronic kidney disease, unspecified: Secondary | ICD-10-CM | POA: Insufficient documentation

## 2015-02-25 HISTORY — DX: Headache, unspecified: R51.9

## 2015-02-28 DIAGNOSIS — D509 Iron deficiency anemia, unspecified: Secondary | ICD-10-CM | POA: Diagnosis not present

## 2015-02-28 DIAGNOSIS — N2581 Secondary hyperparathyroidism of renal origin: Secondary | ICD-10-CM | POA: Diagnosis not present

## 2015-02-28 DIAGNOSIS — N186 End stage renal disease: Secondary | ICD-10-CM | POA: Diagnosis not present

## 2015-02-28 DIAGNOSIS — E875 Hyperkalemia: Secondary | ICD-10-CM | POA: Diagnosis not present

## 2015-02-28 DIAGNOSIS — D631 Anemia in chronic kidney disease: Secondary | ICD-10-CM | POA: Diagnosis not present

## 2015-02-28 DIAGNOSIS — Z23 Encounter for immunization: Secondary | ICD-10-CM | POA: Diagnosis not present

## 2015-03-01 ENCOUNTER — Other Ambulatory Visit (HOSPITAL_COMMUNITY): Payer: Self-pay | Admitting: *Deleted

## 2015-03-02 ENCOUNTER — Inpatient Hospital Stay (HOSPITAL_COMMUNITY): Admission: RE | Admit: 2015-03-02 | Payer: TRICARE For Life (TFL) | Source: Ambulatory Visit

## 2015-03-02 DIAGNOSIS — Z23 Encounter for immunization: Secondary | ICD-10-CM | POA: Diagnosis not present

## 2015-03-02 DIAGNOSIS — D631 Anemia in chronic kidney disease: Secondary | ICD-10-CM | POA: Diagnosis not present

## 2015-03-02 DIAGNOSIS — N186 End stage renal disease: Secondary | ICD-10-CM | POA: Diagnosis not present

## 2015-03-02 DIAGNOSIS — E875 Hyperkalemia: Secondary | ICD-10-CM | POA: Diagnosis not present

## 2015-03-02 DIAGNOSIS — N2581 Secondary hyperparathyroidism of renal origin: Secondary | ICD-10-CM | POA: Diagnosis not present

## 2015-03-02 DIAGNOSIS — D509 Iron deficiency anemia, unspecified: Secondary | ICD-10-CM | POA: Diagnosis not present

## 2015-03-04 DIAGNOSIS — E875 Hyperkalemia: Secondary | ICD-10-CM | POA: Diagnosis not present

## 2015-03-04 DIAGNOSIS — D509 Iron deficiency anemia, unspecified: Secondary | ICD-10-CM | POA: Diagnosis not present

## 2015-03-04 DIAGNOSIS — Z23 Encounter for immunization: Secondary | ICD-10-CM | POA: Diagnosis not present

## 2015-03-04 DIAGNOSIS — N186 End stage renal disease: Secondary | ICD-10-CM | POA: Diagnosis not present

## 2015-03-04 DIAGNOSIS — N2581 Secondary hyperparathyroidism of renal origin: Secondary | ICD-10-CM | POA: Diagnosis not present

## 2015-03-04 DIAGNOSIS — D631 Anemia in chronic kidney disease: Secondary | ICD-10-CM | POA: Diagnosis not present

## 2015-03-07 DIAGNOSIS — Z23 Encounter for immunization: Secondary | ICD-10-CM | POA: Diagnosis not present

## 2015-03-07 DIAGNOSIS — N2581 Secondary hyperparathyroidism of renal origin: Secondary | ICD-10-CM | POA: Diagnosis not present

## 2015-03-07 DIAGNOSIS — E875 Hyperkalemia: Secondary | ICD-10-CM | POA: Diagnosis not present

## 2015-03-07 DIAGNOSIS — D631 Anemia in chronic kidney disease: Secondary | ICD-10-CM | POA: Diagnosis not present

## 2015-03-07 DIAGNOSIS — N186 End stage renal disease: Secondary | ICD-10-CM | POA: Diagnosis not present

## 2015-03-07 DIAGNOSIS — D509 Iron deficiency anemia, unspecified: Secondary | ICD-10-CM | POA: Diagnosis not present

## 2015-03-09 DIAGNOSIS — Z23 Encounter for immunization: Secondary | ICD-10-CM | POA: Diagnosis not present

## 2015-03-09 DIAGNOSIS — D631 Anemia in chronic kidney disease: Secondary | ICD-10-CM | POA: Diagnosis not present

## 2015-03-09 DIAGNOSIS — E875 Hyperkalemia: Secondary | ICD-10-CM | POA: Diagnosis not present

## 2015-03-09 DIAGNOSIS — N186 End stage renal disease: Secondary | ICD-10-CM | POA: Diagnosis not present

## 2015-03-09 DIAGNOSIS — D509 Iron deficiency anemia, unspecified: Secondary | ICD-10-CM | POA: Diagnosis not present

## 2015-03-09 DIAGNOSIS — N2581 Secondary hyperparathyroidism of renal origin: Secondary | ICD-10-CM | POA: Diagnosis not present

## 2015-03-11 DIAGNOSIS — D631 Anemia in chronic kidney disease: Secondary | ICD-10-CM | POA: Diagnosis not present

## 2015-03-11 DIAGNOSIS — N2581 Secondary hyperparathyroidism of renal origin: Secondary | ICD-10-CM | POA: Diagnosis not present

## 2015-03-11 DIAGNOSIS — N186 End stage renal disease: Secondary | ICD-10-CM | POA: Diagnosis not present

## 2015-03-11 DIAGNOSIS — E875 Hyperkalemia: Secondary | ICD-10-CM | POA: Diagnosis not present

## 2015-03-11 DIAGNOSIS — Z23 Encounter for immunization: Secondary | ICD-10-CM | POA: Diagnosis not present

## 2015-03-11 DIAGNOSIS — D509 Iron deficiency anemia, unspecified: Secondary | ICD-10-CM | POA: Diagnosis not present

## 2015-03-14 DIAGNOSIS — D631 Anemia in chronic kidney disease: Secondary | ICD-10-CM | POA: Diagnosis not present

## 2015-03-14 DIAGNOSIS — N2581 Secondary hyperparathyroidism of renal origin: Secondary | ICD-10-CM | POA: Diagnosis not present

## 2015-03-14 DIAGNOSIS — D509 Iron deficiency anemia, unspecified: Secondary | ICD-10-CM | POA: Diagnosis not present

## 2015-03-14 DIAGNOSIS — Z23 Encounter for immunization: Secondary | ICD-10-CM | POA: Diagnosis not present

## 2015-03-14 DIAGNOSIS — N186 End stage renal disease: Secondary | ICD-10-CM | POA: Diagnosis not present

## 2015-03-14 DIAGNOSIS — E875 Hyperkalemia: Secondary | ICD-10-CM | POA: Diagnosis not present

## 2015-03-16 DIAGNOSIS — D631 Anemia in chronic kidney disease: Secondary | ICD-10-CM | POA: Diagnosis not present

## 2015-03-16 DIAGNOSIS — Z23 Encounter for immunization: Secondary | ICD-10-CM | POA: Diagnosis not present

## 2015-03-16 DIAGNOSIS — E875 Hyperkalemia: Secondary | ICD-10-CM | POA: Diagnosis not present

## 2015-03-16 DIAGNOSIS — D509 Iron deficiency anemia, unspecified: Secondary | ICD-10-CM | POA: Diagnosis not present

## 2015-03-16 DIAGNOSIS — N186 End stage renal disease: Secondary | ICD-10-CM | POA: Diagnosis not present

## 2015-03-16 DIAGNOSIS — N2581 Secondary hyperparathyroidism of renal origin: Secondary | ICD-10-CM | POA: Diagnosis not present

## 2015-03-17 ENCOUNTER — Ambulatory Visit (INDEPENDENT_AMBULATORY_CARE_PROVIDER_SITE_OTHER): Payer: Medicare Other | Admitting: Internal Medicine

## 2015-03-17 ENCOUNTER — Encounter: Payer: Self-pay | Admitting: Internal Medicine

## 2015-03-17 VITALS — BP 180/100 | HR 72 | Temp 98.6°F | Wt 203.0 lb

## 2015-03-17 DIAGNOSIS — N186 End stage renal disease: Secondary | ICD-10-CM

## 2015-03-17 DIAGNOSIS — R030 Elevated blood-pressure reading, without diagnosis of hypertension: Secondary | ICD-10-CM

## 2015-03-17 DIAGNOSIS — Z992 Dependence on renal dialysis: Secondary | ICD-10-CM | POA: Diagnosis not present

## 2015-03-17 DIAGNOSIS — E538 Deficiency of other specified B group vitamins: Secondary | ICD-10-CM | POA: Diagnosis not present

## 2015-03-17 DIAGNOSIS — E119 Type 2 diabetes mellitus without complications: Secondary | ICD-10-CM | POA: Diagnosis not present

## 2015-03-17 DIAGNOSIS — IMO0001 Reserved for inherently not codable concepts without codable children: Secondary | ICD-10-CM

## 2015-03-17 MED ORDER — CYANOCOBALAMIN 1000 MCG/ML IJ SOLN
1000.0000 ug | Freq: Once | INTRAMUSCULAR | Status: AC
  Administered 2015-03-17: 1000 ug via INTRAMUSCULAR

## 2015-03-17 NOTE — Patient Instructions (Addendum)
I have asked him to take 100 mg Normodyne at bedtime. He will be following up with dialysis center tomorrow and with nephrologist on Monday at the dialysis center. Continue monthly B-12 injections. Lab work done through [employer]

## 2015-03-17 NOTE — Progress Notes (Signed)
   Subjective:    Patient ID: [name], male    DOB: 01/14/1941, 74 y.o.   MRN: [id number]  HPI  He is now on dialysis 3 times weekly. Says that blood pressure has been elevated at Dialysis Center at [id number] systolically recently. Apparently Normodyne was discontinued in the hospital. History of B-12 deficiency. B-12 injection given today. He has a shunt in each arm. Weight is 203 pounds. Previous weight May 23 was 235 pounds. He is now on Coreg in addition to nifedipine 60 mg daily. He feels fine. Having some adjustment with renal diet. Previously took Normodyne 200 mg twice daily.    Review of Systems     Objective:   Physical Exam We checked his blood pressure in his left leg. It seemed to be good at 120/70. However blood pressure in his left wrist was 180/100. Neck is supple. No JVD. Chest clear. Cardiac exam regular rate and rhythm. Extremities without edema       Assessment & Plan:  Elevated blood pressure  End-stage renal disease on dialysis  Diabetes mellitus  B-12 deficiency  Plan: Lab work done through dialysis center. Of asking him to take 100 mg Normodyne at bedtime. Follow-up at dialysis Center tomorrow and also Monday with nephrologist at dialysis Center.

## 2015-03-18 ENCOUNTER — Other Ambulatory Visit: Payer: Self-pay | Admitting: Internal Medicine

## 2015-03-18 DIAGNOSIS — E875 Hyperkalemia: Secondary | ICD-10-CM | POA: Diagnosis not present

## 2015-03-18 DIAGNOSIS — D509 Iron deficiency anemia, unspecified: Secondary | ICD-10-CM | POA: Diagnosis not present

## 2015-03-18 DIAGNOSIS — D631 Anemia in chronic kidney disease: Secondary | ICD-10-CM | POA: Diagnosis not present

## 2015-03-18 DIAGNOSIS — N186 End stage renal disease: Secondary | ICD-10-CM | POA: Diagnosis not present

## 2015-03-18 DIAGNOSIS — Z23 Encounter for immunization: Secondary | ICD-10-CM | POA: Diagnosis not present

## 2015-03-18 DIAGNOSIS — N2581 Secondary hyperparathyroidism of renal origin: Secondary | ICD-10-CM | POA: Diagnosis not present

## 2015-03-21 ENCOUNTER — Other Ambulatory Visit: Payer: Self-pay | Admitting: Internal Medicine

## 2015-03-21 DIAGNOSIS — D631 Anemia in chronic kidney disease: Secondary | ICD-10-CM | POA: Diagnosis not present

## 2015-03-21 DIAGNOSIS — D509 Iron deficiency anemia, unspecified: Secondary | ICD-10-CM | POA: Diagnosis not present

## 2015-03-21 DIAGNOSIS — E875 Hyperkalemia: Secondary | ICD-10-CM | POA: Diagnosis not present

## 2015-03-21 DIAGNOSIS — N2581 Secondary hyperparathyroidism of renal origin: Secondary | ICD-10-CM | POA: Diagnosis not present

## 2015-03-21 DIAGNOSIS — Z23 Encounter for immunization: Secondary | ICD-10-CM | POA: Diagnosis not present

## 2015-03-21 DIAGNOSIS — N186 End stage renal disease: Secondary | ICD-10-CM | POA: Diagnosis not present

## 2015-03-21 NOTE — Telephone Encounter (Signed)
Why is this needed? Was it prescribed in Hospital?

## 2015-03-22 DIAGNOSIS — D631 Anemia in chronic kidney disease: Secondary | ICD-10-CM | POA: Diagnosis not present

## 2015-03-22 DIAGNOSIS — I120 Hypertensive chronic kidney disease with stage 5 chronic kidney disease or end stage renal disease: Secondary | ICD-10-CM | POA: Diagnosis not present

## 2015-03-22 DIAGNOSIS — E119 Type 2 diabetes mellitus without complications: Secondary | ICD-10-CM | POA: Diagnosis not present

## 2015-03-22 DIAGNOSIS — N185 Chronic kidney disease, stage 5: Secondary | ICD-10-CM | POA: Diagnosis not present

## 2015-03-23 DIAGNOSIS — D509 Iron deficiency anemia, unspecified: Secondary | ICD-10-CM | POA: Diagnosis not present

## 2015-03-23 DIAGNOSIS — Z23 Encounter for immunization: Secondary | ICD-10-CM | POA: Diagnosis not present

## 2015-03-23 DIAGNOSIS — N2581 Secondary hyperparathyroidism of renal origin: Secondary | ICD-10-CM | POA: Diagnosis not present

## 2015-03-23 DIAGNOSIS — N186 End stage renal disease: Secondary | ICD-10-CM | POA: Diagnosis not present

## 2015-03-23 DIAGNOSIS — D631 Anemia in chronic kidney disease: Secondary | ICD-10-CM | POA: Diagnosis not present

## 2015-03-23 DIAGNOSIS — E875 Hyperkalemia: Secondary | ICD-10-CM | POA: Diagnosis not present

## 2015-03-24 DIAGNOSIS — E1122 Type 2 diabetes mellitus with diabetic chronic kidney disease: Secondary | ICD-10-CM | POA: Diagnosis not present

## 2015-03-24 DIAGNOSIS — N2581 Secondary hyperparathyroidism of renal origin: Secondary | ICD-10-CM | POA: Diagnosis not present

## 2015-03-24 DIAGNOSIS — Z992 Dependence on renal dialysis: Secondary | ICD-10-CM | POA: Diagnosis not present

## 2015-03-24 DIAGNOSIS — D631 Anemia in chronic kidney disease: Secondary | ICD-10-CM | POA: Diagnosis not present

## 2015-03-24 DIAGNOSIS — E875 Hyperkalemia: Secondary | ICD-10-CM | POA: Diagnosis not present

## 2015-03-24 DIAGNOSIS — T82858A Stenosis of vascular prosthetic devices, implants and grafts, initial encounter: Secondary | ICD-10-CM | POA: Diagnosis not present

## 2015-03-24 DIAGNOSIS — Z23 Encounter for immunization: Secondary | ICD-10-CM | POA: Diagnosis not present

## 2015-03-24 DIAGNOSIS — D509 Iron deficiency anemia, unspecified: Secondary | ICD-10-CM | POA: Diagnosis not present

## 2015-03-24 DIAGNOSIS — N186 End stage renal disease: Secondary | ICD-10-CM | POA: Diagnosis not present

## 2015-03-24 DIAGNOSIS — I871 Compression of vein: Secondary | ICD-10-CM | POA: Diagnosis not present

## 2015-03-25 DIAGNOSIS — D509 Iron deficiency anemia, unspecified: Secondary | ICD-10-CM | POA: Diagnosis not present

## 2015-03-25 DIAGNOSIS — D631 Anemia in chronic kidney disease: Secondary | ICD-10-CM | POA: Diagnosis not present

## 2015-03-25 DIAGNOSIS — N2581 Secondary hyperparathyroidism of renal origin: Secondary | ICD-10-CM | POA: Diagnosis not present

## 2015-03-25 DIAGNOSIS — E1129 Type 2 diabetes mellitus with other diabetic kidney complication: Secondary | ICD-10-CM | POA: Diagnosis not present

## 2015-03-25 DIAGNOSIS — Z992 Dependence on renal dialysis: Secondary | ICD-10-CM

## 2015-03-25 DIAGNOSIS — N186 End stage renal disease: Secondary | ICD-10-CM

## 2015-03-25 DIAGNOSIS — Z23 Encounter for immunization: Secondary | ICD-10-CM | POA: Diagnosis not present

## 2015-03-25 DIAGNOSIS — E8770 Fluid overload, unspecified: Secondary | ICD-10-CM | POA: Diagnosis not present

## 2015-03-25 HISTORY — DX: End stage renal disease: N18.6

## 2015-03-25 HISTORY — DX: Dependence on renal dialysis: Z99.2

## 2015-03-31 ENCOUNTER — Ambulatory Visit (INDEPENDENT_AMBULATORY_CARE_PROVIDER_SITE_OTHER): Payer: Medicare Other | Admitting: Internal Medicine

## 2015-03-31 ENCOUNTER — Telehealth: Payer: Self-pay | Admitting: *Deleted

## 2015-03-31 VITALS — BP 142/64 | HR 60 | Temp 97.4°F

## 2015-03-31 DIAGNOSIS — L219 Seborrheic dermatitis, unspecified: Secondary | ICD-10-CM

## 2015-03-31 DIAGNOSIS — H1132 Conjunctival hemorrhage, left eye: Secondary | ICD-10-CM

## 2015-03-31 DIAGNOSIS — L03211 Cellulitis of face: Secondary | ICD-10-CM

## 2015-03-31 DIAGNOSIS — L218 Other seborrheic dermatitis: Secondary | ICD-10-CM | POA: Diagnosis not present

## 2015-03-31 LAB — CBC WITH DIFFERENTIAL/PLATELET
Basophils Absolute: 0 10*3/uL (ref 0.0–0.1)
Basophils Relative: 0 % (ref 0–1)
Eosinophils Absolute: 0.3 10*3/uL (ref 0.0–0.7)
Eosinophils Relative: 4 % (ref 0–5)
HCT: 33.3 % — ABNORMAL LOW (ref 39.0–52.0)
Hemoglobin: 11 g/dL — ABNORMAL LOW (ref 13.0–17.0)
Lymphocytes Relative: 29 % (ref 12–46)
Lymphs Abs: 2.5 10*3/uL (ref 0.7–4.0)
MCH: 34.6 pg — ABNORMAL HIGH (ref 26.0–34.0)
MCHC: 33 g/dL (ref 30.0–36.0)
MCV: 104.7 fL — ABNORMAL HIGH (ref 78.0–100.0)
MPV: 11 fL (ref 8.6–12.4)
Monocytes Absolute: 0.9 10*3/uL (ref 0.1–1.0)
Monocytes Relative: 11 % (ref 3–12)
Neutro Abs: 4.8 10*3/uL (ref 1.7–7.7)
Neutrophils Relative %: 56 % (ref 43–77)
Platelets: 165 10*3/uL (ref 150–400)
RBC: 3.18 MIL/uL — ABNORMAL LOW (ref 4.22–5.81)
RDW: 14.3 % (ref 11.5–15.5)
WBC: 8.6 10*3/uL (ref 4.0–10.5)

## 2015-03-31 MED ORDER — INSULIN REGULAR HUMAN 100 UNIT/ML IJ SOLN
INTRAMUSCULAR | Status: DC
Start: 2015-03-31 — End: 2016-01-10

## 2015-03-31 MED ORDER — KETOCONAZOLE 2 % EX SHAM: 1.0000 "application " | MEDICATED_SHAMPOO | CUTANEOUS | Status: DC

## 2015-03-31 NOTE — Telephone Encounter (Signed)
Spoke with wife reviewed medication changes and instructions she will record patient blood sugars and call us in the am. Patient wife states she has noticed he has had memory loss recently and she is concerned we will address this with patient for further evaluation

## 2015-03-31 NOTE — Telephone Encounter (Signed)
Reviewed patient medications

## 2015-04-04 ENCOUNTER — Telehealth: Payer: Self-pay | Admitting: *Deleted

## 2015-04-04 ENCOUNTER — Encounter: Payer: Self-pay | Admitting: Internal Medicine

## 2015-04-04 ENCOUNTER — Ambulatory Visit (INDEPENDENT_AMBULATORY_CARE_PROVIDER_SITE_OTHER): Payer: Medicare Other | Admitting: Internal Medicine

## 2015-04-04 VITALS — BP 136/60 | HR 60 | Temp 97.7°F

## 2015-04-04 DIAGNOSIS — E1122 Type 2 diabetes mellitus with diabetic chronic kidney disease: Secondary | ICD-10-CM

## 2015-04-04 DIAGNOSIS — L989 Disorder of the skin and subcutaneous tissue, unspecified: Secondary | ICD-10-CM

## 2015-04-04 DIAGNOSIS — R413 Other amnesia: Secondary | ICD-10-CM

## 2015-04-04 LAB — TSH: TSH: 1.526 u[IU]/mL (ref 0.350–4.500)

## 2015-04-04 LAB — FOLATE: Folate: 7 ng/mL

## 2015-04-04 LAB — VITAMIN B12: Vitamin B-12: 1068 pg/mL — ABNORMAL HIGH (ref 211–911)

## 2015-04-04 LAB — RPR

## 2015-04-04 MED ORDER — CLOBETASOL PROPIONATE 0.05 % EX OINT: 1.0000 "application " | TOPICAL_OINTMENT | Freq: Two times a day (BID) | CUTANEOUS | Status: DC

## 2015-04-04 NOTE — Progress Notes (Signed)
   Subjective:    Patient ID: MEADE EDD, male    DOB: 1940-12-13, 74 y.o.   MRN: [id number]  HPI  We have arranged for him to have MRI of the brain without contrast due to memory loss issues. Wife is noticed it and I have noticed it is well. He's not is clear and sharp as he was previously. Seems to have some issues keeping track of his medications. Recently started him on 3 units regular insulin at bedtime. Wasn't sure how Accu-Cheks would respond with dialysis and kidney failure. He brings in multiple Accu-Chek readings ranging from 201-412. Therefore I'm going to place him on sliding scale insulin is follows.  For Accu-Chek greater than 350 he will take 10 units regular insulin  For Accu-Chek 300-350 he'll take 7 units regular insulin  For Accu check 250-300 he'll take 5 units regular insulin    For Accu-Chek 200-250 he'll take 3 units regular insulin.   For Accu-Chek less than 200, he will take no insulin  He is continuing to complain of scalp dermatitis-we will refer to dermatologist  He'll see endocrinologist regarding diabetic management with kidney failure and dialysis  He will see neurologist regarding memory loss after MRI is done. We also check B-12 and folate levels today    Review of Systems     Objective:   Physical Exam  Spent 25 minutes speaking with him today about insulin management, memory issues and the need for neurology evaluation, dermatitis concerns      Assessment & Plan:  Memory loss-rule out CVA with recent hospitalization for uremia. Now on dialysis  Dialysis  Scalp dermatitis-to see dermatologist  Type 2 diabetes mellitus-to try sliding scale regular insulin  Plan: He's to call me with Accu-Cheks in the next couple of days. Endocrinology consultation. Dermatology consultation. Neurology consultation.

## 2015-04-06 NOTE — Telephone Encounter (Signed)
Patient scheduled to see Dr Jarome Matin September 20th at 9:30 patient to arrive at 9:15 to fill out paperwork.Patient scheduled to see Dr Dwyane Dee August 23,2016 at 11:15 Patient to arrive at 11:00. Appt information given to patient's wife.

## 2015-04-12 ENCOUNTER — Ambulatory Visit
Admission: RE | Admit: 2015-04-12 | Discharge: 2015-04-12 | Disposition: A | Discharge status: Home / Self Care | Payer: Medicare Other | Source: Ambulatory Visit | Attending: Internal Medicine | Admitting: Internal Medicine

## 2015-04-12 DIAGNOSIS — R413 Other amnesia: Secondary | ICD-10-CM | POA: Diagnosis not present

## 2015-04-15 DIAGNOSIS — E161 Other hypoglycemia: Secondary | ICD-10-CM | POA: Diagnosis not present

## 2015-04-18 ENCOUNTER — Encounter: Payer: Self-pay | Admitting: Internal Medicine

## 2015-04-18 ENCOUNTER — Ambulatory Visit (INDEPENDENT_AMBULATORY_CARE_PROVIDER_SITE_OTHER): Payer: Medicare Other | Admitting: Internal Medicine

## 2015-04-18 VITALS — BP 140/64 | HR 60

## 2015-04-18 DIAGNOSIS — E538 Deficiency of other specified B group vitamins: Secondary | ICD-10-CM

## 2015-04-18 MED ORDER — CYANOCOBALAMIN 1000 MCG/ML IJ SOLN
1000.0000 ug | Freq: Once | INTRAMUSCULAR | Status: AC
  Administered 2015-04-18: 1000 ug via INTRAMUSCULAR

## 2015-04-18 NOTE — Progress Notes (Signed)
Patient presents today for B 12 injection. Patient VS stable. Patient tolerated injection well.

## 2015-04-19 DIAGNOSIS — B9689 Other specified bacterial agents as the cause of diseases classified elsewhere: Secondary | ICD-10-CM | POA: Diagnosis not present

## 2015-04-19 DIAGNOSIS — L02821 Furuncle of head [any part, except face]: Secondary | ICD-10-CM | POA: Diagnosis not present

## 2015-04-19 DIAGNOSIS — L219 Seborrheic dermatitis, unspecified: Secondary | ICD-10-CM | POA: Diagnosis not present

## 2015-04-20 DIAGNOSIS — E1129 Type 2 diabetes mellitus with other diabetic kidney complication: Secondary | ICD-10-CM | POA: Diagnosis not present

## 2015-04-24 ENCOUNTER — Encounter: Payer: Self-pay | Admitting: Internal Medicine

## 2015-04-24 DIAGNOSIS — N186 End stage renal disease: Secondary | ICD-10-CM | POA: Diagnosis not present

## 2015-04-24 DIAGNOSIS — E1122 Type 2 diabetes mellitus with diabetic chronic kidney disease: Secondary | ICD-10-CM | POA: Diagnosis not present

## 2015-04-24 DIAGNOSIS — Z992 Dependence on renal dialysis: Secondary | ICD-10-CM | POA: Diagnosis not present

## 2015-04-24 NOTE — Patient Instructions (Signed)
To try 3 units regular insulin at bedtime especially if Accu-Cheks continued to run high. To have MRI of the brain and neurology evaluation. Needs endocrinology consultation. Try Nizoral for seborrhea of scalp

## 2015-04-24 NOTE — Progress Notes (Signed)
   Subjective:    Patient ID: [name], male    DOB: [birth year], 74 y.o.   MRN: [id number]  HPI  [name] is in today complaining of conjunctivitis issues as well as hair and scalp problems. Says he has sores in his scalp that are very itchy. He has seen dermatologist. Says he is also having issues keeping up with his medications. Since being started on dialysis, he seems confused. His wife confirms this to me privately. We have tried to control his diabetes Glucotrol. Sometimes he doesn't follow a strict diet. Apparently has medications given to him for seborrhea through the [facility].    Review of Systems     Objective:   Physical Exam  Scleral hemorrhage left eye. Seborrhea of scalp. No evidence of conjunctivitis.      Assessment & Plan:  End-stage renal failure  Dialysis  Seborrhea of scalp-Nizoral shampoo prescribed. If this does not work he needs to see dermatologist.  Scleral hemorrhage left eye  Diabetes mellitus-currently poorly controlled  Confusion  Plan: He will need to have a neurology workup for  forgetfulness. He could've had a CNS event such as a stroke recently. Check B-12 and folate levels. Diabetes will need to be managed by endocrinologist  I am afraid to give him long-acting insulin at this point. He'll start 3 units regular insulin at bedtime if Accu-Cheks are running over 300. He is to monitor Accu-Cheks at least before breakfast and supper. Arrange for MRI of the brain.  25 minutes spent assessing his situation

## 2015-04-25 DIAGNOSIS — N186 End stage renal disease: Secondary | ICD-10-CM | POA: Diagnosis not present

## 2015-04-25 DIAGNOSIS — D509 Iron deficiency anemia, unspecified: Secondary | ICD-10-CM | POA: Diagnosis not present

## 2015-04-25 DIAGNOSIS — D631 Anemia in chronic kidney disease: Secondary | ICD-10-CM | POA: Diagnosis not present

## 2015-04-25 DIAGNOSIS — Z23 Encounter for immunization: Secondary | ICD-10-CM | POA: Diagnosis not present

## 2015-04-25 DIAGNOSIS — E1129 Type 2 diabetes mellitus with other diabetic kidney complication: Secondary | ICD-10-CM | POA: Diagnosis not present

## 2015-04-25 DIAGNOSIS — N2581 Secondary hyperparathyroidism of renal origin: Secondary | ICD-10-CM | POA: Diagnosis not present

## 2015-04-26 ENCOUNTER — Telehealth: Payer: Self-pay | Admitting: Pulmonary Disease

## 2015-04-26 NOTE — Telephone Encounter (Signed)
Home Sleep Test order was put in 02/02/2015 Patient's wife was contacted 3 different times. The patient had other health problems

## 2015-04-28 DIAGNOSIS — L219 Seborrheic dermatitis, unspecified: Secondary | ICD-10-CM | POA: Diagnosis not present

## 2015-05-03 ENCOUNTER — Other Ambulatory Visit: Payer: Medicare Other | Admitting: Internal Medicine

## 2015-05-05 ENCOUNTER — Other Ambulatory Visit: Payer: Medicare Other | Admitting: Internal Medicine

## 2015-05-05 DIAGNOSIS — IMO0001 Reserved for inherently not codable concepts without codable children: Secondary | ICD-10-CM

## 2015-05-05 DIAGNOSIS — Z794 Long term (current) use of insulin: Principal | ICD-10-CM

## 2015-05-05 DIAGNOSIS — E119 Type 2 diabetes mellitus without complications: Principal | ICD-10-CM

## 2015-05-06 ENCOUNTER — Telehealth: Payer: Self-pay | Admitting: *Deleted

## 2015-05-06 LAB — HEMOGLOBIN A1C
Hgb A1c MFr Bld: 9 % — ABNORMAL HIGH (ref <5.7)
Mean Plasma Glucose: 212 mg/dL — ABNORMAL HIGH (ref <117)

## 2015-05-06 NOTE — Telephone Encounter (Signed)
Spoke with patient reviewed recent HgbA1C results advised patient to keep appt with Dr Dwyane Dee on 05/17/2015 for eval and treatment of DM

## 2015-05-09 ENCOUNTER — Ambulatory Visit (INDEPENDENT_AMBULATORY_CARE_PROVIDER_SITE_OTHER): Payer: Medicare Other | Admitting: Internal Medicine

## 2015-05-09 ENCOUNTER — Telehealth: Payer: Self-pay | Admitting: *Deleted

## 2015-05-09 ENCOUNTER — Encounter: Payer: Self-pay | Admitting: Internal Medicine

## 2015-05-09 VITALS — BP 144/62 | HR 63 | Temp 97.9°F | Wt 214.0 lb

## 2015-05-09 DIAGNOSIS — R413 Other amnesia: Secondary | ICD-10-CM

## 2015-05-09 NOTE — Patient Instructions (Addendum)
To see neurologist in near future. Continue IM B12 monthly.   See Endocrinologist August 23rd. I will see him again in 3 months.

## 2015-05-09 NOTE — Progress Notes (Signed)
   Subjective:    Patient ID: [name], male    DOB: 1941-07-21, 74 y.o.   MRN: [id number]  HPI 74 year old 71 male in today for follow-up on multiple issues. He's been forgetful lately. He had an MRI of the brain mid July. 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 His Accu-Cheks have been high since starting dialysis. He's been on a sliding scale. Says Accu-Cheks are frequently 300 and that he frequently has to take sliding scale insulin to control his sugar and then it's up again at the next reading. Says he's following a good diet. Says he is eating better. Wife is noticed he's forgetful as well. He has appointment to see endocrinologist soon.  He had recent workup for dementia with B-12 and folate levels .In May 2015, he was started on B-12 injections because his vitamin B-12 level was 237 which was low normal. Recent B-12 level was 1068 in this office.  Sliding scale insulin as follows:  200-250 take 3 Units Humulin R  251-300  Take 5 units Humulin R  301-350  Take 7 units Humulin R  351 or higher 10 units Take  Humulin R  I was considering adding Lantus insulin to this regimen but he told me he had an episode of profound hypoglycemia with an Accu-Chek of 20 and emergency personnel were called of to his home recently in the middle of the night.  I have wife to attend endocrinology and neurology consultations with him. Recent hemoglobin A1c was 9%. TSH checked recently with dementia workup and was within normal limits. B-12 and folate levels were normal as well just recently.  Review of Systems     Objective:   Physical Exam Skin warm and dry. Nodes none. Chest clear. Cardiac exam regular rate and rhythm normal S1 and S2. He is alert and oriented 3.       Assessment & Plan:  Hyperglycemia. Hemoglobin A1c 9% and previously was in the 6 range.  ? Dementia  History of B-12 deficiency  End-stage renal failure now on  dialysis  Plan:  Follow-up with endocrinologist late August. Neurology consultation. Will not start Lantus insulin at this time pending endocrinology consultation.

## 2015-05-09 NOTE — Telephone Encounter (Signed)
Spoke with patients wife about appt with Neurology and with Endocrinology she states patient is unable to make appt with Dr Jaynee Eagles on Friday they are going out of town and he is getting dialysis at [facility] on Friday she will call and reschedule appt. She will go with him to Endo appt with Dr Dwyane Dee on Tuesday 05/17/15. Explained importance to patient wife for her to accompany him to appts.

## 2015-05-13 ENCOUNTER — Ambulatory Visit: Payer: Medicare Other | Admitting: Neurology

## 2015-05-17 ENCOUNTER — Encounter: Payer: Self-pay | Admitting: Endocrinology

## 2015-05-17 ENCOUNTER — Ambulatory Visit (INDEPENDENT_AMBULATORY_CARE_PROVIDER_SITE_OTHER): Payer: Medicare Other | Admitting: Endocrinology

## 2015-05-17 VITALS — BP 118/64 | HR 60 | Temp 97.8°F | Resp 16 | Ht 69.0 in | Wt 209.0 lb

## 2015-05-17 DIAGNOSIS — N186 End stage renal disease: Secondary | ICD-10-CM | POA: Diagnosis not present

## 2015-05-17 DIAGNOSIS — E049 Nontoxic goiter, unspecified: Secondary | ICD-10-CM

## 2015-05-17 DIAGNOSIS — E1165 Type 2 diabetes mellitus with hyperglycemia: Secondary | ICD-10-CM

## 2015-05-17 DIAGNOSIS — I10 Essential (primary) hypertension: Secondary | ICD-10-CM | POA: Diagnosis not present

## 2015-05-17 DIAGNOSIS — Z992 Dependence on renal dialysis: Secondary | ICD-10-CM

## 2015-05-17 DIAGNOSIS — IMO0002 Reserved for concepts with insufficient information to code with codable children: Secondary | ICD-10-CM

## 2015-05-17 MED ORDER — PIOGLITAZONE HCL 30 MG PO TABS: 30.0000 mg | ORAL_TABLET | Freq: Every day | ORAL | Status: DC

## 2015-05-17 NOTE — Patient Instructions (Signed)
Stop Glipizide  Lantus 10 units and adjust every 3 days till am sugar <150  Regular insulin 8 units before Bfst and 10 before dinner; may add EXTRA 3 UNITS IF SUGAR OVER 300

## 2015-05-17 NOTE — Progress Notes (Signed)
Patient ID: [name], male   DOB: Jan 13, 1941, 74 y.o.   MRN: [id number]           Reason for Appointment: Consultation for Type 2 Diabetes  Referring physician: [place]  History of Present Illness:          Date of diagnosis of type 2 diabetes mellitus :        Background history:  He has had long-standing diabetes probably treated with metformin initially and subsequently with sulfonylurea drugs At some point he was also given Actos in addition to his glipizide which he is still taking Records of his control are only available for the last 4 years Over the last few years his A1c has been generally in the 6-7% range  Recent history:   His A1c was 6.2 in May and apparently was starting to get low normal blood sugars at times He was admitted to the hospital for fluid overload and renal failure and at that time his Actos was stopped Subsequently his blood sugars have been markedly increased He was given Lantus insulin for a short time in early May before his hospitalization but not continued subsequently  He has only been taking Regular Insulin as needed for his high sugars without any basal insulin He has been referred here now because of persistently high readings and recent A1c of 9%  INSULIN regimen is described as: R insulin prn, up to 4 times a day, 3-10 units based on blood sugar        Current blood sugar patterns and problems identified:  He thinks his blood sugars are consistently high throughout the day and frequently over 300   He will sometimes have better readings in the mornings but generally not near normal, today was 202  He does take some regular insulin at bedtime if the blood sugars are high which they usually are  He has frequent readings around 400 also   Although he takes up to 10 units of regular insulin when the blood sugars are high they usually do not come back to normal  Oral hypoglycemic drugs the patient is taking are:   glipizide 5 mg daily in  a.m.     Side effects from medications have been: None  Compliance with the medical regimen: Good  Hypoglycemia:   none recently  Glucose monitoring:  done 3-4  times a day         Glucometer:  Accu-Chek Blood Glucose readings as above  Self-care: The diet that the patient has been following is: tries to limit sweets, portions. eating out only about once a week at a steak house        Typical meal intake: Breakfast is bacon, eggs and toast, usually light lunch, mostly snacks or half sandwich               Dietician visit, most recent:               Exercise: none, not motivated   Weight history:  Wt Readings from Last 3 Encounters:  05/17/15 209 lb (94.802 kg)  05/09/15 214 lb (97.07 kg)  03/17/15 203 lb (92.08 kg)    Glycemic control:   Lab Results  Component Value Date   HGBA1C 9.0* 05/05/2015   HGBA1C 6.2* 02/18/2015   HGBA1C 6.2* 02/14/2015   Lab Results  Component Value Date   MICROALBUR 354.8* 02/14/2015   LDLCALC 31 02/14/2015   CREATININE 3.57* 02/24/2015  Medication List       This list is accurate as of: 05/17/15  3:43 PM.  Always use your most recent med list.               acetaminophen 500 MG tablet  Commonly known as:  TYLENOL  Take 500 mg by mouth every 6 (six) hours as needed.     allopurinol 100 MG tablet  Commonly known as:  ZYLOPRIM  Take 100 mg by mouth daily.     aspirin 325 MG tablet  Take 650 mg by mouth daily.     calcitRIOL 0.25 MCG capsule  Commonly known as:  ROCALTROL  Take 0.25-0.5 mcg by mouth See admin instructions. Takes 2 capsules ever day     carvedilol 12.5 MG tablet  Commonly known as:  COREG  Take 12.5 mg by mouth 2 (two) times daily with a meal.     clobetasol ointment 0.05 %  Commonly known as:  TEMOVATE  Apply 1 application topically 2 (two) times daily. Sores on head     clobetasol 0.05 % external solution  Commonly known as:  TEMOVATE  apply to affected area ON THE SCALP UP TO 2 TIMES A DAY AS  NEEDED...  (REFER TO PRESCRIPTION NOTES).     DSS 100 MG Caps  Take 100 mg by mouth 2 (two) times daily.     FLUOCINOLONE ACETONIDE SCALP 0.01 % Oil  apply to affected area ON SCALP EVERY NIGHT AT BEDTIME, WASH UPON AWAKENING     fluticasone 0.05 % cream  Commonly known as:  CUTIVATE  apply to affected area ON FACE UP TO 2 TIMES A DAY AS NEEDED     fluticasone 50 MCG/ACT nasal spray  Commonly known as:  FLONASE  Place 1 spray into both nostrils daily.     glipiZIDE 5 MG tablet  Commonly known as:  GLUCOTROL  Take 1 tablet (5 mg total) by mouth daily before breakfast.     insulin regular 100 units/mL injection  Commonly known as:  HUMULIN R  3 units Humulin R subcutaneous before bedtime if Accu-Cheks are running over 300     ketoconazole 2 % shampoo  Commonly known as:  NIZORAL  Apply 1 application topically 2 (two) times a week.     levothyroxine 50 MCG tablet  Commonly known as:  SYNTHROID, LEVOTHROID  Take 1 tablet (50 mcg total) by mouth daily.     lidocaine-prilocaine cream  Commonly known as:  EMLA     loratadine 10 MG tablet  Commonly known as:  CLARITIN  Take 10 mg by mouth daily as needed for allergies.     mupirocin ointment 2 %  Commonly known as:  BACTROBAN  Use as directed to affected areas     NIFEdipine 60 MG 24 hr tablet  Commonly known as:  PROCARDIA-XL/ADALAT CC  Take 60 mg by mouth daily.     pioglitazone 30 MG tablet  Commonly known as:  ACTOS  Take 1 tablet (30 mg total) by mouth daily.     RENVELA 800 MG tablet  Generic drug:  sevelamer carbonate  Take 800 mg by mouth 3 (three) times daily with meals. Takes 3  Tablets with each meal and 1 tablet with snack     simvastatin 20 MG tablet  Commonly known as:  ZOCOR  Take 10 mg by mouth at bedtime.     triamcinolone ointment 0.1 %  Commonly known as:  KENALOG  Apply 1 application topically  2 (two) times daily. Use between thighs        Allergies:  Allergies  Allergen Reactions  .  Penicillins Rash    Past Medical History  Diagnosis Date  . Hypertension   . Thyroid disease   . Renal insufficiency   . Hyperlipidemia   . ED (erectile dysfunction)   . Allergy   . Elevated homocysteine   . PVD (peripheral vascular disease)     has plastic aorta  . Arthritis   . Diverticulitis   . Seasonal allergies   . Hypothyroidism   . GERD (gastroesophageal reflux disease)     pepto   . Gout   . Diabetes mellitus     Type 2  . Pneumonia   . Anemia     Past Surgical History  Procedure Laterality Date  . Aortobifemoral bypass    . Breast surgery      left - granulomatous mastitis  . Sbo with lysis adhesions    . Eye surgery Bilateral     cataracts  . Colonoscopy    . Av fistula placement Left 12/01/2013    Procedure: ARTERIOVENOUS (AV) FISTULA CREATION- LEFT BRACHIOCEPHALIC;  Surgeon: Angelia Mould, MD;  Location: [facility];  Service: Vascular;  Laterality: Left;  . Revison of arteriovenous fistula Left 02/09/2014    Procedure: REVISON OF LEFT ARTERIOVENOUS FISTULA - RESECTION OF RENDUNDANT VEIN;  Surgeon: Angelia Mould, MD;  Location: [facility];  Service: Vascular;  Laterality: Left;  . Bascilic vein transposition Right 07/27/2014    Procedure: BASCILIC VEIN TRANSPOSITION;  Surgeon: Angelia Mould, MD;  Location: [facility];  Service: Vascular;  Laterality: Right;  . Shuntogram Left 04/19/2014    Procedure: FISTULOGRAM;  Surgeon: Angelia Mould, MD;  Location: [facility] CATH LAB;  Service: Cardiovascular;  Laterality: Left;  . Unilateral upper extremeity angiogram N/A 07/12/2014    Procedure: UNILATERAL UPPER Anselmo Rod;  Surgeon: Angelia Mould, MD;  Location: [facility] CATH LAB;  Service: Cardiovascular;  Laterality: N/A;    Family History  Problem Relation Age of Onset  . Aneurysm Mother   . Heart disease Father   . Stroke Father   . Hypertension Father   . Diabetes Father     Social History:  reports that he quit smoking about 20 years  ago. He has never used smokeless tobacco. He reports that he drinks alcohol. He reports that he does not use illicit drugs.    Review of Systems    Lipid history: He has been treated with 20 mg Zocor, not clear what baseline levels were    Lab Results  Component Value Date   CHOL 83 02/14/2015   HDL 42 02/14/2015   LDLCALC 31 02/14/2015   TRIG 51 02/14/2015   CHOLHDL 2.0 02/14/2015           Constitutional: no recent weight gain/loss, no complaints of unusual fatigue   Eyes: no history of blurred vision.  Most recent eye exam was 2015  ENT: no nasal congestion, difficulty swallowing, no hoarseness   Cardiovascular: no chest pain or tightness on exertion.  No leg swelling.  Hypertension:1980s  Respiratory: no cough/shortness of breath  Gastrointestinal: no constipation, diarrhea, nausea or abdominal pain  Musculoskeletal: no muscle/joint aches   Urological:   No frequency of urination or  nocturia  Skin: no rash or infections  Neurological: no headaches.  Has no numbness, burning, pains or tingling in feet    Psychiatric: no symptoms of depression  Endocrine: No  unusual fatigue, cold intolerance    history of thyroid disease  Lab Results  Component Value Date   TSH 1.526 04/04/2015   TSH 2.946 02/18/2015   TSH 3.187 02/14/2015      LABS:  No visits with results within 1 Week(s) from this visit. Latest known visit with results is:  Lab on 05/05/2015  Component Date Value Ref Range Status  . Hgb A1c MFr Bld 05/05/2015 9.0* <5.7 % Final   Comment:                                                                        According to the ADA Clinical Practice Recommendations for 2011, when HbA1c is used as a screening test:     >=6.5%   Diagnostic of Diabetes Mellitus            (if abnormal result is confirmed)   5.7-6.4%   Increased risk of developing Diabetes Mellitus   References:Diagnosis and Classification of Diabetes  [place],Diabetes [id number] 1):S62-S69 and Standards of Medical Care in         Diabetes - 2011,Diabetes [id number] (Suppl 1):S11-S61.     . Mean Plasma Glucose 05/05/2015 212* <117 mg/dL Final    Physical Examination:  BP 118/64 mmHg  Pulse 60  Temp(Src) 97.8 F (36.6 C)  Resp 16  Ht 5\' 9"  (1.753 m)  Wt 209 lb (94.802 kg)  BMI 30.85 kg/m2  SpO2 99%  GENERAL:         Patient has mild generalized obesity.   HEENT:         Eye exam shows normal external appearance. Fundus exam shows no retinopathy. Oral exam shows normal mucosa .  NECK:   There is no lymphadenopathy Thyroid is just palpable on swallowing, minimally enlarged and no nodules felt.  Carotids are normal to palpation and no bruit heard LUNGS:         Chest is symmetrical. Lungs are clear to auscultation.Marland Kitchen   HEART:         Heart sounds:  S1 and S2 are normal. No murmurs or clicks heard., no S3 or S4.   ABDOMEN:   There is no distention present. Liver and spleen are not palpable. No other mass or tenderness present.   NEUROLOGICAL:   Vibration sense is moderately reduced in distal first toes. Ankle jerks are normal bilaterally.          Pedal pulses are not palpable except left posterior tibialis Monofilament sensation normal in the toes MUSCULOSKELETAL:  There is no swelling or deformity of the peripheral joints. Spine is normal to inspection.   EXTREMITIES:     There is no edema. No skin lesions present.Marland Kitchen SKIN:       No rash or lesions of concern.        ASSESSMENT:  Diabetes type 2, uncontrolled with A1c of 9% He has had a marked increase in blood sugars this year since his hospitalization, previously had been fairly stable with A1c 7 or under He likely has had much higher sugars with stopping Actos which was done prior to his starting dialysis with a state of fluid overload Does have normal ejection fraction and no contraindication to Actos now  However because of his  marked hyperglycemia with readings  as high as 400 he will require insulin now to control his blood sugar and reduce Lucas toxicity. 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  Etiology of this is unclear and apparently CKD dates back to his aortofemoral bypass surgery  HYPERTENSION: Long-standing and appears well controlled  LIPIDS: Currently cholesterol is below 100 with regimen of 20 mg simvastatin  ?  History of hypothyroidism.  He is only on small doses of Synthroid 50 g and has only one mildly increased documented TSH level recently Appears to have a small goiter on exam too  PLAN:   Rotate sites of injections as discussed  Continue checking blood sugars before meals and bedtime and bring monitor for download.  Blood sugar diary also given.  Start taking Lantus insulin 10 units in the morning.  Given him detailed instructions on flow sheet on how to adjust this every 3 days by 2 units to keep morning sugar at least under 115.  His wife will help him adjust his insulin  Take base amount of mealtime coverage of 8 units at breakfast and 10 units before supper.  Also take additional 3 units for blood sugars over 300; no insulin at bedtime  Start regular walking  Consultation with diabetes nurse educator next week  Start Actos 30 mg daily, discussed that he may require less insulin once this is effective  Stop glipizide   follow-up in 3 weeks   Patient Instructions  Stop Glipizide  Lantus 10 units and adjust every 3 days till am sugar  <150  Regular insulin 8 units before Bfst and 10 before dinner; may add EXTRA 3 UNITS IF SUGAR OVER 300      Counseling time on subjects discussed above is over 50% of today's 60 minute visit   Jamelia Varano 05/17/2015, 3:43 PM   Note: This office note was prepared with Estate agent. Any transcriptional errors that result from this process are unintentional.

## 2015-05-19 ENCOUNTER — Ambulatory Visit (INDEPENDENT_AMBULATORY_CARE_PROVIDER_SITE_OTHER): Payer: Medicare Other | Admitting: Internal Medicine

## 2015-05-19 VITALS — BP 146/62 | HR 64

## 2015-05-19 DIAGNOSIS — E538 Deficiency of other specified B group vitamins: Secondary | ICD-10-CM | POA: Diagnosis not present

## 2015-05-19 MED ORDER — CYANOCOBALAMIN 1000 MCG/ML IJ SOLN
1000.0000 ug | Freq: Once | INTRAMUSCULAR | Status: AC
  Administered 2015-05-19: 1000 ug via INTRAMUSCULAR

## 2015-05-19 NOTE — Progress Notes (Signed)
Patient presents today for B 12 injection. Patient tolerated injection well. 

## 2015-05-20 ENCOUNTER — Ambulatory Visit (INDEPENDENT_AMBULATORY_CARE_PROVIDER_SITE_OTHER): Payer: Medicare Other | Admitting: Neurology

## 2015-05-20 ENCOUNTER — Encounter: Payer: Self-pay | Admitting: Neurology

## 2015-05-20 ENCOUNTER — Encounter: Payer: Self-pay | Admitting: Internal Medicine

## 2015-05-20 VITALS — BP 179/66 | HR 58 | Ht 69.0 in | Wt 212.0 lb

## 2015-05-20 DIAGNOSIS — R0683 Snoring: Secondary | ICD-10-CM

## 2015-05-20 DIAGNOSIS — G4719 Other hypersomnia: Secondary | ICD-10-CM | POA: Diagnosis not present

## 2015-05-20 DIAGNOSIS — R0681 Apnea, not elsewhere classified: Secondary | ICD-10-CM | POA: Diagnosis not present

## 2015-05-20 MED ORDER — DONEPEZIL HCL 5 MG PO TABS: 5.0000 mg | ORAL_TABLET | Freq: Every day | ORAL | Status: DC

## 2015-05-20 MED ORDER — DONEPEZIL HCL 10 MG PO TABS: 10.0000 mg | ORAL_TABLET | Freq: Every day | ORAL | Status: DC

## 2015-05-20 NOTE — Patient Instructions (Addendum)
Overall you are doing fairly well but I do want to suggest a few things today:   Remember to drink plenty of fluid, eat healthy meals and do not skip any meals. Try to eat protein with a every meal and eat a healthy snack such as fruit or nuts in between meals. Try to keep a regular sleep-wake schedule and try to exercise daily, particularly in the form of walking, 20-30 minutes a day, if you can.   As far as diagnostic testing: sleep study  Aricept 5mg  then can increase to 10mg   I would like to see you back in 6 months, sooner if we need to. Please call us with any interim questions, concerns, problems, updates or refill requests.   Please also call us for any test results so we can go over those with you on the phone.  My clinical assistant and will answer any of your questions and relay your messages to me and also relay most of my messages to you.   Our phone number is [phone]. We also have an after hours call service for urgent matters and there is a physician on-call for urgent questions. For any emergencies you know to call 911 or go to the nearest emergency room

## 2015-05-20 NOTE — Progress Notes (Signed)
[id number]    Provider:  Dr Jaynee Eagles Referring Provider: Elby Showers, MD Primary Care Physician:  Elby Showers, MD  CC:  Memory problems  HPI:  [name] is a 74 y.o. male here as a referral from Dr. Renold Genta for memory problems. PMHx peripheral vascular disease, hypertension, hypothyroidism, hyperparathyroidism, diabetes(uncontrolled since starting dialysis), end-stage renal disease on dialysis, hyperlipidemia, b-12 deficiency on injections (B12 237 at that time).   Recently he has noticed he can't catch onto things as quickly as he used to. The latter part of may her noticed it, when he was having more medical issues and he was gaining fluid and had a bout of pneumonia. His memory got really bad before he went into the hospital and was started on dialysis. Things are better since then. He is more alert and catching things better. Wife is here and provides much information, she says he can't keep up with his cell phone and keys and he loses them. He goes into the kitchen to make a snack and he forgets to close the cabinets. He is forgetting people's names, appointments. He used to always be able to monitor medications and he doesn't remember what he is taking his meds for now. He used to be very sharp with this meds but there is a big change. He misplaces checks. He does pay the bills. No accidents with the car or at home. No delusions or hallucinations. No changes in personality. He is more anxious because of the medical problems. Wife endorses depression. He still gets out and likes to do things, but the side effects of his medical conditions are hard for him to handle. He is frustrated. No loss of consciousness, no episodes of confusion, no staring spells. No other focal neurologic deficits. He worries a lot. Endorses snoring, excessive daytime fatigue and witnessed apneic events.    Reviewed notes, labs and imaging from outside physicians, which showed:hgba1c 9.0, b12 1068,  tsh wnl, folate wnl, rpr nr,    IMPRESSION: personally reviewed images 1. No acute intracranial abnormality. 2. Cerebellar chronic small vessel ischemia, Mild to moderate. Minimal to mild for age nonspecific cerebral white matter signal changes, also most commonly due to small vessel disease.  Patient follows with Dr. Tedra Senegal.  Note from July 2016, Dr. Renold Genta notes that patient discuss memory loss issues, but he is not clear sharp as he was previously, he some issues keeping track of his medications, more forgetful.She did an extensive workup for dementia which included MRI of the brain and lab work as detailed above. Patient's B12 was noted to be low in May 2015 and he was started on B12 injections. Recent B12 was improved.  Review of Systems: Patient complains of symptoms per HPI as well as the following symptoms: easy bruising, snoring, cramps, rash, impotence, memory loss, anxiety, snoring. Pertinent negatives per HPI. All others negative.   Social History   Social History  . Marital Status: Married    Spouse Name: [name]  . Number of Children: 1  . Years of Education: 16   Occupational History  . Retired    Social History Main Topics  . Smoking status: Former Smoker    Quit date: 09/24/1994  . Smokeless tobacco: Never Used  . Alcohol Use: Yes     Comment: Quit Oct. 1977 ("somewhat heavy")  . Drug Use: No  . Sexual Activity: Not on file   Other Topics Concern  . Not on file   Social History Narrative  Lives at home with wife.   Caffeine use: Drinks no soda or tea.    Drinks 1 cup coffee/week       Family History  Problem Relation Age of Onset  . Aneurysm Mother   . Heart disease Father   . Stroke Father   . Hypertension Father   . Diabetes Father   . Dementia Neg Hx     Past Medical History  Diagnosis Date  . Hypertension   . Thyroid disease   . Renal insufficiency   . Hyperlipidemia   . ED (erectile dysfunction)   . Allergy   . Elevated  homocysteine   . PVD (peripheral vascular disease)     has plastic aorta  . Arthritis   . Diverticulitis   . Seasonal allergies   . Hypothyroidism   . GERD (gastroesophageal reflux disease)     pepto   . Gout   . Diabetes mellitus     Type 2  . Pneumonia   . Anemia     Past Surgical History  Procedure Laterality Date  . Aortobifemoral bypass    . Breast surgery      left - granulomatous mastitis  . Sbo with lysis adhesions    . Eye surgery Bilateral     cataracts  . Colonoscopy    . Av fistula placement Left 12/01/2013    Procedure: ARTERIOVENOUS (AV) FISTULA CREATION- LEFT BRACHIOCEPHALIC;  Surgeon: Angelia Mould, MD;  Location: [facility];  Service: Vascular;  Laterality: Left;  . Revison of arteriovenous fistula Left 02/09/2014    Procedure: REVISON OF LEFT ARTERIOVENOUS FISTULA - RESECTION OF RENDUNDANT VEIN;  Surgeon: Angelia Mould, MD;  Location: [facility];  Service: Vascular;  Laterality: Left;  . Bascilic vein transposition Right 07/27/2014    Procedure: BASCILIC VEIN TRANSPOSITION;  Surgeon: Angelia Mould, MD;  Location: [facility];  Service: Vascular;  Laterality: Right;  . Shuntogram Left 04/19/2014    Procedure: FISTULOGRAM;  Surgeon: Angelia Mould, MD;  Location: [facility] C[facility] LAB;  Service: Cardiovascular;  Laterality: Left;  . Unilateral upper extremeity angiogram N/A 07/12/2014    Procedure: UNILATERAL UPPER Anselmo Rod;  Surgeon: Angelia Mould, MD;  Location: [facility] CATH LAB;  Service: Cardiovascular;  Laterality: N/A;    Current Outpatient Prescriptions  Medication Sig Dispense Refill  . acetaminophen (TYLENOL) 500 MG tablet Take 500 mg by mouth every 6 (six) hours as needed.    Marland Kitchen allopurinol (ZYLOPRIM) 100 MG tablet Take 100 mg by mouth daily.      Marland Kitchen aspirin 325 MG tablet Take 650 mg by mouth daily.     . carvedilol (COREG) 12.5 MG tablet Take 12.5 mg by mouth 2 (two) times daily with a meal.  0  . clobetasol (TEMOVATE) 0.05 %  external solution apply to affected area ON THE SCALP UP TO 2 TIMES A DAY AS NEEDED...  (REFER TO PRESCRIPTION NOTES).  0  . clobetasol ointment (TEMOVATE) [id number] % Apply 1 application topically 2 (two) times daily. Sores on head 60 g 2  . FLUOCINOLONE ACETONIDE SCALP 0.01 % OIL apply to affected area ON SCALP EVERY NIGHT AT BEDTIME, WASH UPON AWAKENING  0  . fluticasone (CUTIVATE) 0.05 % cream apply to affected area ON FACE UP TO 2 TIMES A DAY AS NEEDED  0  . fluticasone (FLONASE) 50 MCG/ACT nasal spray Place 1 spray into both nostrils daily. (Patient taking differently: Place 1 spray into both nostrils daily as needed for allergies. ) 16  g 2  . insulin glargine (LANTUS) 100 UNIT/ML injection Inject 10 Units into the skin daily.    . insulin regular (HUMULIN R) 100 units/mL injection 3 units Humulin R subcutaneous before bedtime if Accu-Cheks are running over 300 10 mL 11  . ketoconazole (NIZORAL) 2 % shampoo Apply 1 application topically 2 (two) times a week. 120 mL 0  . levothyroxine (SYNTHROID, LEVOTHROID) 50 MCG tablet Take 1 tablet (50 mcg total) by mouth daily. 90 tablet 1  . lidocaine-prilocaine (EMLA) cream   1  . loratadine (CLARITIN) 10 MG tablet Take 10 mg by mouth daily as needed for allergies.     Marland Kitchen NIFEdipine (PROCARDIA-XL/ADALAT CC) 60 MG 24 hr tablet Take 60 mg by mouth daily.      . pioglitazone (ACTOS) 30 MG tablet Take 1 tablet (30 mg total) by mouth daily. 30 tablet 3  . RENVELA 800 MG tablet Take 800 mg by mouth 3 (three) times daily with meals. Takes 3  Tablets with each meal and 1 tablet with snack  0  . triamcinolone ointment (KENALOG) 0.1 % Apply 1 application topically 2 (two) times daily. Use between thighs    . calcitRIOL (ROCALTROL) 0.25 MCG capsule Take 0.25-0.5 mcg by mouth See admin instructions. Takes 2 capsules ever day    . docusate sodium 100 MG CAPS Take 100 mg by mouth 2 (two) times daily. (Patient not taking: Reported on 05/20/2015) 10 capsule 0  . donepezil  (ARICEPT) 10 MG tablet Take 1 tablet (10 mg total) by mouth at bedtime. 30 tablet 3  . donepezil (ARICEPT) 5 MG tablet Take 1 tablet (5 mg total) by mouth at bedtime. 30 tablet 0  . glipiZIDE (GLUCOTROL) 5 MG tablet Take 1 tablet (5 mg total) by mouth daily before breakfast. (Patient not taking: Reported on 05/20/2015) 30 tablet 0  . mupirocin ointment (BACTROBAN) 2 % Use as directed to affected areas (Patient not taking: Reported on 05/20/2015) 22 g prn  . simvastatin (ZOCOR) 20 MG tablet Take 10 mg by mouth at bedtime.      No current facility-administered medications for this visit.    Allergies as of 05/20/2015 - Review Complete 05/20/2015  Allergen Reaction Noted  . Penicillins Rash     Vitals: BP 179/66 mmHg  Pulse 58  Ht 5\' 9"  (1.753 m)  Wt 212 lb (96.163 kg)  BMI 31.29 kg/m2 Last Weight:  Wt Readings from Last 1 Encounters:  05/20/15 212 lb (96.163 kg)   Last Height:   Ht Readings from Last 1 Encounters:  05/20/15 5\' 9"  (1.753 m)   Physical exam: Exam: Gen: NAD, conversant, well nourised, well groomed                     CV: RRR, no MRG. No Carotid Bruits. No peripheral edema, warm, nontender Eyes: Conjunctivae clear without exudates or hemorrhage  Neuro: Detailed Neurologic Exam  Speech:    Speech is normal; fluent and spontaneous with normal comprehension.  Cognition: MoCA 23/30 -1 attention, -2 serial 7, -1 language, -1 abstraction, -2 delayed recall.    The patient is oriented to person, place, and time;     recent and remote memory intact;     language fluent;     normal attention, concentration, fund of knowledge Cranial Nerves:    The pupils are equal, round, and reactive to light. The fundi are flat. Visual fields are full to finger confrontation. Extraocular movements are intact. Trigeminal sensation is intact and  the muscles of mastication are normal. The face is symmetric. The palate elevates in the midline. Hearing intact. Voice is normal. Shoulder shrug  is normal. The tongue has normal motion without fasciculations.   Coordination:    Normal finger to nose and heel to shin. Normal rapid alternating movements.   Gait:    Heel-toe and tandem gait are normal.   Motor Observation:    No asymmetry, no atrophy, and no involuntary movements noted. Tone:    Normal muscle tone.    Posture:    Posture is normal. normal erect    Strength:    Strength is V/V in the upper and lower limbs.      Sensation: intact to LT     Reflex Exam:  DTR's:    Deep tendon reflexes in the upper and lower extremities are symmetrical bilaterally.   Toes:    The toes are downgoing bilaterally.   Clonus:    Clonus is absent.       Assessment/Plan:    DEMANTE BREINING is a very nice 74 y.o. male here as a referral from Dr. Renold Genta for memory problems. PMHx peripheral vascular disease, hypertension, hypothyroidism, hyperparathyroidism, diabetes(uncontrolled since starting dialysis), end-stage renal disease on dialysis, hyperlipidemia, b-12 deficiency on injections (B12 237 at that time). Neurologic exam is nonfocal. [place] cognitive assessment 23 out of 30. MRI of the brain showed nonspecific white matter changes that were mild for age. Discussion with wife and patient that his memory complaints are likely multifactorial. There is probably some mild cognitive impairment that's normal for age but his medical problems are likely contributing significantly to his complaints. I discussed that medical conditions can definitely affect cognitive abilities. Dr. Renold Genta already did a very thorough dementia workup. The only thing I would add is a sleep study given patient's complaints of snoring and excessive daytime fatigue. Untreated sleep apnea can cause cognitive impairment, headaches, excessive daytime fatigue as well as increased risk of stroke, hypertension.   Sarina Ill, MD  [facility] [address] [place], [place]  [address]  Phone [phone] Fax [phone]

## 2015-05-24 ENCOUNTER — Encounter: Payer: Self-pay | Admitting: Neurology

## 2015-05-24 ENCOUNTER — Ambulatory Visit: Payer: Medicare Other | Admitting: Nutrition

## 2015-05-25 ENCOUNTER — Other Ambulatory Visit: Payer: Self-pay | Admitting: *Deleted

## 2015-05-25 ENCOUNTER — Telehealth: Payer: Self-pay | Admitting: Endocrinology

## 2015-05-25 DIAGNOSIS — Z992 Dependence on renal dialysis: Secondary | ICD-10-CM | POA: Diagnosis not present

## 2015-05-25 DIAGNOSIS — N186 End stage renal disease: Secondary | ICD-10-CM | POA: Diagnosis not present

## 2015-05-25 DIAGNOSIS — E1122 Type 2 diabetes mellitus with diabetic chronic kidney disease: Secondary | ICD-10-CM | POA: Diagnosis not present

## 2015-05-25 MED ORDER — INSULIN GLARGINE 100 UNIT/ML ~~LOC~~ SOLN: 10.0000 [IU] | Freq: Every day | SUBCUTANEOUS | Status: DC

## 2015-05-25 NOTE — Telephone Encounter (Signed)
Pt requesting lantus rx called into [facility] please. Please call in by tomorrow he is going out of town

## 2015-05-25 NOTE — Telephone Encounter (Signed)
rx sent

## 2015-05-27 DIAGNOSIS — N186 End stage renal disease: Secondary | ICD-10-CM | POA: Diagnosis not present

## 2015-05-27 DIAGNOSIS — N2581 Secondary hyperparathyroidism of renal origin: Secondary | ICD-10-CM | POA: Diagnosis not present

## 2015-05-30 DIAGNOSIS — N2581 Secondary hyperparathyroidism of renal origin: Secondary | ICD-10-CM | POA: Diagnosis not present

## 2015-05-30 DIAGNOSIS — N186 End stage renal disease: Secondary | ICD-10-CM | POA: Diagnosis not present

## 2015-06-01 ENCOUNTER — Ambulatory Visit: Payer: Medicare Other | Admitting: Nutrition

## 2015-06-01 DIAGNOSIS — E1129 Type 2 diabetes mellitus with other diabetic kidney complication: Secondary | ICD-10-CM | POA: Diagnosis not present

## 2015-06-01 DIAGNOSIS — N186 End stage renal disease: Secondary | ICD-10-CM | POA: Diagnosis not present

## 2015-06-01 DIAGNOSIS — N2581 Secondary hyperparathyroidism of renal origin: Secondary | ICD-10-CM | POA: Diagnosis not present

## 2015-06-03 DIAGNOSIS — N2581 Secondary hyperparathyroidism of renal origin: Secondary | ICD-10-CM | POA: Diagnosis not present

## 2015-06-03 DIAGNOSIS — N186 End stage renal disease: Secondary | ICD-10-CM | POA: Diagnosis not present

## 2015-06-03 DIAGNOSIS — E1129 Type 2 diabetes mellitus with other diabetic kidney complication: Secondary | ICD-10-CM | POA: Diagnosis not present

## 2015-06-06 DIAGNOSIS — N2581 Secondary hyperparathyroidism of renal origin: Secondary | ICD-10-CM | POA: Diagnosis not present

## 2015-06-06 DIAGNOSIS — E1129 Type 2 diabetes mellitus with other diabetic kidney complication: Secondary | ICD-10-CM | POA: Diagnosis not present

## 2015-06-06 DIAGNOSIS — N186 End stage renal disease: Secondary | ICD-10-CM | POA: Diagnosis not present

## 2015-06-08 DIAGNOSIS — N186 End stage renal disease: Secondary | ICD-10-CM | POA: Diagnosis not present

## 2015-06-08 DIAGNOSIS — N2581 Secondary hyperparathyroidism of renal origin: Secondary | ICD-10-CM | POA: Diagnosis not present

## 2015-06-08 DIAGNOSIS — E1129 Type 2 diabetes mellitus with other diabetic kidney complication: Secondary | ICD-10-CM | POA: Diagnosis not present

## 2015-06-10 DIAGNOSIS — N186 End stage renal disease: Secondary | ICD-10-CM | POA: Diagnosis not present

## 2015-06-10 DIAGNOSIS — E1129 Type 2 diabetes mellitus with other diabetic kidney complication: Secondary | ICD-10-CM | POA: Diagnosis not present

## 2015-06-10 DIAGNOSIS — N2581 Secondary hyperparathyroidism of renal origin: Secondary | ICD-10-CM | POA: Diagnosis not present

## 2015-06-13 ENCOUNTER — Telehealth: Payer: Self-pay

## 2015-06-13 ENCOUNTER — Institutional Professional Consult (permissible substitution): Payer: Medicare Other | Admitting: Neurology

## 2015-06-13 DIAGNOSIS — N2581 Secondary hyperparathyroidism of renal origin: Secondary | ICD-10-CM | POA: Diagnosis not present

## 2015-06-13 DIAGNOSIS — E1129 Type 2 diabetes mellitus with other diabetic kidney complication: Secondary | ICD-10-CM | POA: Diagnosis not present

## 2015-06-13 DIAGNOSIS — N186 End stage renal disease: Secondary | ICD-10-CM | POA: Diagnosis not present

## 2015-06-13 NOTE — Telephone Encounter (Signed)
Patient did not show to appt today  

## 2015-06-14 ENCOUNTER — Ambulatory Visit: Payer: Medicare Other | Admitting: Endocrinology

## 2015-06-14 DIAGNOSIS — L738 Other specified follicular disorders: Secondary | ICD-10-CM | POA: Diagnosis not present

## 2015-06-15 DIAGNOSIS — E1129 Type 2 diabetes mellitus with other diabetic kidney complication: Secondary | ICD-10-CM | POA: Diagnosis not present

## 2015-06-15 DIAGNOSIS — N186 End stage renal disease: Secondary | ICD-10-CM | POA: Diagnosis not present

## 2015-06-15 DIAGNOSIS — N2581 Secondary hyperparathyroidism of renal origin: Secondary | ICD-10-CM | POA: Diagnosis not present

## 2015-06-17 DIAGNOSIS — N186 End stage renal disease: Secondary | ICD-10-CM | POA: Diagnosis not present

## 2015-06-17 DIAGNOSIS — N2581 Secondary hyperparathyroidism of renal origin: Secondary | ICD-10-CM | POA: Diagnosis not present

## 2015-06-17 DIAGNOSIS — E1129 Type 2 diabetes mellitus with other diabetic kidney complication: Secondary | ICD-10-CM | POA: Diagnosis not present

## 2015-06-20 DIAGNOSIS — N2581 Secondary hyperparathyroidism of renal origin: Secondary | ICD-10-CM | POA: Diagnosis not present

## 2015-06-20 DIAGNOSIS — E1129 Type 2 diabetes mellitus with other diabetic kidney complication: Secondary | ICD-10-CM | POA: Diagnosis not present

## 2015-06-20 DIAGNOSIS — N186 End stage renal disease: Secondary | ICD-10-CM | POA: Diagnosis not present

## 2015-06-21 ENCOUNTER — Ambulatory Visit (INDEPENDENT_AMBULATORY_CARE_PROVIDER_SITE_OTHER): Payer: Medicare Other | Admitting: Internal Medicine

## 2015-06-21 VITALS — BP 154/60

## 2015-06-21 DIAGNOSIS — E538 Deficiency of other specified B group vitamins: Secondary | ICD-10-CM

## 2015-06-21 MED ORDER — CYANOCOBALAMIN 1000 MCG/ML IJ SOLN
1000.0000 ug | Freq: Once | INTRAMUSCULAR | Status: AC
  Administered 2015-06-21: 1000 ug via INTRAMUSCULAR

## 2015-06-22 DIAGNOSIS — E1129 Type 2 diabetes mellitus with other diabetic kidney complication: Secondary | ICD-10-CM | POA: Diagnosis not present

## 2015-06-22 DIAGNOSIS — N186 End stage renal disease: Secondary | ICD-10-CM | POA: Diagnosis not present

## 2015-06-22 DIAGNOSIS — N2581 Secondary hyperparathyroidism of renal origin: Secondary | ICD-10-CM | POA: Diagnosis not present

## 2015-06-24 DIAGNOSIS — N2581 Secondary hyperparathyroidism of renal origin: Secondary | ICD-10-CM | POA: Diagnosis not present

## 2015-06-24 DIAGNOSIS — E1122 Type 2 diabetes mellitus with diabetic chronic kidney disease: Secondary | ICD-10-CM | POA: Diagnosis not present

## 2015-06-24 DIAGNOSIS — E1129 Type 2 diabetes mellitus with other diabetic kidney complication: Secondary | ICD-10-CM | POA: Diagnosis not present

## 2015-06-24 DIAGNOSIS — N186 End stage renal disease: Secondary | ICD-10-CM | POA: Diagnosis not present

## 2015-06-24 DIAGNOSIS — Z992 Dependence on renal dialysis: Secondary | ICD-10-CM | POA: Diagnosis not present

## 2015-06-27 DIAGNOSIS — Z23 Encounter for immunization: Secondary | ICD-10-CM | POA: Diagnosis not present

## 2015-06-27 DIAGNOSIS — N186 End stage renal disease: Secondary | ICD-10-CM | POA: Diagnosis not present

## 2015-06-27 DIAGNOSIS — D631 Anemia in chronic kidney disease: Secondary | ICD-10-CM | POA: Diagnosis not present

## 2015-06-27 DIAGNOSIS — D509 Iron deficiency anemia, unspecified: Secondary | ICD-10-CM | POA: Diagnosis not present

## 2015-06-27 DIAGNOSIS — N2581 Secondary hyperparathyroidism of renal origin: Secondary | ICD-10-CM | POA: Diagnosis not present

## 2015-06-28 ENCOUNTER — Ambulatory Visit (INDEPENDENT_AMBULATORY_CARE_PROVIDER_SITE_OTHER): Payer: Medicare Other | Admitting: Internal Medicine

## 2015-06-28 ENCOUNTER — Encounter: Payer: Self-pay | Admitting: Internal Medicine

## 2015-06-28 VITALS — BP 142/62 | HR 62 | Temp 98.8°F | Ht 69.0 in | Wt 215.0 lb

## 2015-06-28 DIAGNOSIS — I70209 Unspecified atherosclerosis of native arteries of extremities, unspecified extremity: Secondary | ICD-10-CM

## 2015-06-28 DIAGNOSIS — M791 Myalgia: Secondary | ICD-10-CM

## 2015-06-28 DIAGNOSIS — M7918 Myalgia, other site: Secondary | ICD-10-CM

## 2015-06-28 NOTE — Progress Notes (Signed)
   Subjective:    Patient ID: [name], male    DOB: 1941-08-16, 74 y.o.   MRN: [id number]  HPI Weight is stable at 215 pounds and previously was 214 pounds in August. Says Accu-Cheks are much improved after seeing endocrinologist.  He is here today because a large branch struck him in the left lateral rib cage area a couple of weeks ago and is still a bit sore. He has point tenderness along that area. He's been using some heat patches and it seems to be slowly improving.  Also was washing off some rock around his house with a Clorox solution. He was stooping for some 30 minutes and when he stood up he had pain in his right posterior back area. He was concerned about a DS can. He has no numbness or tingling in the right lower extremity. Wife has been treating this conservatively with heat.    Review of Systems     Objective:   Physical Exam Straight leg raising is negative on the right. He has point tenderness left lateral lower rib cage area. He has some tenderness in his right paralumbar muscle area.       Assessment & Plan:  Musculoskeletal pain  Plan: May continue with conservative therapy with moist heat. No medication prescribed. Symptoms should resolve.

## 2015-06-28 NOTE — Patient Instructions (Signed)
Continue with conservative therapy with moist heat to back area.

## 2015-06-29 DIAGNOSIS — D631 Anemia in chronic kidney disease: Secondary | ICD-10-CM | POA: Diagnosis not present

## 2015-06-29 DIAGNOSIS — D509 Iron deficiency anemia, unspecified: Secondary | ICD-10-CM | POA: Diagnosis not present

## 2015-06-29 DIAGNOSIS — N2581 Secondary hyperparathyroidism of renal origin: Secondary | ICD-10-CM | POA: Diagnosis not present

## 2015-06-29 DIAGNOSIS — Z23 Encounter for immunization: Secondary | ICD-10-CM | POA: Diagnosis not present

## 2015-06-29 DIAGNOSIS — N186 End stage renal disease: Secondary | ICD-10-CM | POA: Diagnosis not present

## 2015-07-01 DIAGNOSIS — Z23 Encounter for immunization: Secondary | ICD-10-CM | POA: Diagnosis not present

## 2015-07-01 DIAGNOSIS — D631 Anemia in chronic kidney disease: Secondary | ICD-10-CM | POA: Diagnosis not present

## 2015-07-01 DIAGNOSIS — N2581 Secondary hyperparathyroidism of renal origin: Secondary | ICD-10-CM | POA: Diagnosis not present

## 2015-07-01 DIAGNOSIS — D509 Iron deficiency anemia, unspecified: Secondary | ICD-10-CM | POA: Diagnosis not present

## 2015-07-01 DIAGNOSIS — N186 End stage renal disease: Secondary | ICD-10-CM | POA: Diagnosis not present

## 2015-07-04 DIAGNOSIS — D631 Anemia in chronic kidney disease: Secondary | ICD-10-CM | POA: Diagnosis not present

## 2015-07-04 DIAGNOSIS — N186 End stage renal disease: Secondary | ICD-10-CM | POA: Diagnosis not present

## 2015-07-04 DIAGNOSIS — N2581 Secondary hyperparathyroidism of renal origin: Secondary | ICD-10-CM | POA: Diagnosis not present

## 2015-07-04 DIAGNOSIS — D509 Iron deficiency anemia, unspecified: Secondary | ICD-10-CM | POA: Diagnosis not present

## 2015-07-04 DIAGNOSIS — Z23 Encounter for immunization: Secondary | ICD-10-CM | POA: Diagnosis not present

## 2015-07-05 ENCOUNTER — Institutional Professional Consult (permissible substitution): Payer: Medicare Other | Admitting: Neurology

## 2015-07-05 ENCOUNTER — Telehealth: Payer: Self-pay

## 2015-07-05 NOTE — Telephone Encounter (Signed)
Patient did not show to appt today  

## 2015-07-06 ENCOUNTER — Encounter: Payer: Self-pay | Admitting: Neurology

## 2015-07-06 DIAGNOSIS — Z23 Encounter for immunization: Secondary | ICD-10-CM | POA: Diagnosis not present

## 2015-07-06 DIAGNOSIS — D509 Iron deficiency anemia, unspecified: Secondary | ICD-10-CM | POA: Diagnosis not present

## 2015-07-06 DIAGNOSIS — N186 End stage renal disease: Secondary | ICD-10-CM | POA: Diagnosis not present

## 2015-07-06 DIAGNOSIS — N2581 Secondary hyperparathyroidism of renal origin: Secondary | ICD-10-CM | POA: Diagnosis not present

## 2015-07-06 DIAGNOSIS — D631 Anemia in chronic kidney disease: Secondary | ICD-10-CM | POA: Diagnosis not present

## 2015-07-08 DIAGNOSIS — N2581 Secondary hyperparathyroidism of renal origin: Secondary | ICD-10-CM | POA: Diagnosis not present

## 2015-07-08 DIAGNOSIS — N186 End stage renal disease: Secondary | ICD-10-CM | POA: Diagnosis not present

## 2015-07-08 DIAGNOSIS — D509 Iron deficiency anemia, unspecified: Secondary | ICD-10-CM | POA: Diagnosis not present

## 2015-07-08 DIAGNOSIS — Z23 Encounter for immunization: Secondary | ICD-10-CM | POA: Diagnosis not present

## 2015-07-08 DIAGNOSIS — D631 Anemia in chronic kidney disease: Secondary | ICD-10-CM | POA: Diagnosis not present

## 2015-07-11 DIAGNOSIS — D631 Anemia in chronic kidney disease: Secondary | ICD-10-CM | POA: Diagnosis not present

## 2015-07-11 DIAGNOSIS — N186 End stage renal disease: Secondary | ICD-10-CM | POA: Diagnosis not present

## 2015-07-11 DIAGNOSIS — N2581 Secondary hyperparathyroidism of renal origin: Secondary | ICD-10-CM | POA: Diagnosis not present

## 2015-07-11 DIAGNOSIS — D509 Iron deficiency anemia, unspecified: Secondary | ICD-10-CM | POA: Diagnosis not present

## 2015-07-11 DIAGNOSIS — Z23 Encounter for immunization: Secondary | ICD-10-CM | POA: Diagnosis not present

## 2015-07-13 DIAGNOSIS — D509 Iron deficiency anemia, unspecified: Secondary | ICD-10-CM | POA: Diagnosis not present

## 2015-07-13 DIAGNOSIS — N2581 Secondary hyperparathyroidism of renal origin: Secondary | ICD-10-CM | POA: Diagnosis not present

## 2015-07-13 DIAGNOSIS — Z23 Encounter for immunization: Secondary | ICD-10-CM | POA: Diagnosis not present

## 2015-07-13 DIAGNOSIS — N186 End stage renal disease: Secondary | ICD-10-CM | POA: Diagnosis not present

## 2015-07-13 DIAGNOSIS — D631 Anemia in chronic kidney disease: Secondary | ICD-10-CM | POA: Diagnosis not present

## 2015-07-14 DIAGNOSIS — L738 Other specified follicular disorders: Secondary | ICD-10-CM | POA: Diagnosis not present

## 2015-07-14 DIAGNOSIS — D692 Other nonthrombocytopenic purpura: Secondary | ICD-10-CM | POA: Diagnosis not present

## 2015-07-15 DIAGNOSIS — D631 Anemia in chronic kidney disease: Secondary | ICD-10-CM | POA: Diagnosis not present

## 2015-07-15 DIAGNOSIS — N2581 Secondary hyperparathyroidism of renal origin: Secondary | ICD-10-CM | POA: Diagnosis not present

## 2015-07-15 DIAGNOSIS — D509 Iron deficiency anemia, unspecified: Secondary | ICD-10-CM | POA: Diagnosis not present

## 2015-07-15 DIAGNOSIS — N186 End stage renal disease: Secondary | ICD-10-CM | POA: Diagnosis not present

## 2015-07-15 DIAGNOSIS — Z23 Encounter for immunization: Secondary | ICD-10-CM | POA: Diagnosis not present

## 2015-07-18 DIAGNOSIS — Z992 Dependence on renal dialysis: Secondary | ICD-10-CM | POA: Diagnosis not present

## 2015-07-18 DIAGNOSIS — N186 End stage renal disease: Secondary | ICD-10-CM | POA: Diagnosis not present

## 2015-07-20 DIAGNOSIS — N186 End stage renal disease: Secondary | ICD-10-CM | POA: Diagnosis not present

## 2015-07-20 DIAGNOSIS — Z992 Dependence on renal dialysis: Secondary | ICD-10-CM | POA: Diagnosis not present

## 2015-07-22 DIAGNOSIS — D631 Anemia in chronic kidney disease: Secondary | ICD-10-CM | POA: Diagnosis not present

## 2015-07-22 DIAGNOSIS — N186 End stage renal disease: Secondary | ICD-10-CM | POA: Diagnosis not present

## 2015-07-22 DIAGNOSIS — Z23 Encounter for immunization: Secondary | ICD-10-CM | POA: Diagnosis not present

## 2015-07-22 DIAGNOSIS — D509 Iron deficiency anemia, unspecified: Secondary | ICD-10-CM | POA: Diagnosis not present

## 2015-07-22 DIAGNOSIS — N2581 Secondary hyperparathyroidism of renal origin: Secondary | ICD-10-CM | POA: Diagnosis not present

## 2015-07-25 DIAGNOSIS — E1122 Type 2 diabetes mellitus with diabetic chronic kidney disease: Secondary | ICD-10-CM | POA: Diagnosis not present

## 2015-07-25 DIAGNOSIS — Z992 Dependence on renal dialysis: Secondary | ICD-10-CM | POA: Diagnosis not present

## 2015-07-25 DIAGNOSIS — D509 Iron deficiency anemia, unspecified: Secondary | ICD-10-CM | POA: Diagnosis not present

## 2015-07-25 DIAGNOSIS — E1129 Type 2 diabetes mellitus with other diabetic kidney complication: Secondary | ICD-10-CM | POA: Diagnosis not present

## 2015-07-25 DIAGNOSIS — Z23 Encounter for immunization: Secondary | ICD-10-CM | POA: Diagnosis not present

## 2015-07-25 DIAGNOSIS — N186 End stage renal disease: Secondary | ICD-10-CM | POA: Diagnosis not present

## 2015-07-25 DIAGNOSIS — N2581 Secondary hyperparathyroidism of renal origin: Secondary | ICD-10-CM | POA: Diagnosis not present

## 2015-07-25 DIAGNOSIS — D631 Anemia in chronic kidney disease: Secondary | ICD-10-CM | POA: Diagnosis not present

## 2015-07-27 DIAGNOSIS — D509 Iron deficiency anemia, unspecified: Secondary | ICD-10-CM | POA: Diagnosis not present

## 2015-07-27 DIAGNOSIS — D631 Anemia in chronic kidney disease: Secondary | ICD-10-CM | POA: Diagnosis not present

## 2015-07-27 DIAGNOSIS — E1129 Type 2 diabetes mellitus with other diabetic kidney complication: Secondary | ICD-10-CM | POA: Diagnosis not present

## 2015-07-27 DIAGNOSIS — N186 End stage renal disease: Secondary | ICD-10-CM | POA: Diagnosis not present

## 2015-07-27 DIAGNOSIS — N2581 Secondary hyperparathyroidism of renal origin: Secondary | ICD-10-CM | POA: Diagnosis not present

## 2015-07-29 DIAGNOSIS — E119 Type 2 diabetes mellitus without complications: Secondary | ICD-10-CM | POA: Diagnosis not present

## 2015-07-29 DIAGNOSIS — H1132 Conjunctival hemorrhage, left eye: Secondary | ICD-10-CM | POA: Diagnosis not present

## 2015-07-29 DIAGNOSIS — G43909 Migraine, unspecified, not intractable, without status migrainosus: Secondary | ICD-10-CM | POA: Diagnosis not present

## 2015-07-30 DIAGNOSIS — E1129 Type 2 diabetes mellitus with other diabetic kidney complication: Secondary | ICD-10-CM | POA: Diagnosis not present

## 2015-07-30 DIAGNOSIS — D509 Iron deficiency anemia, unspecified: Secondary | ICD-10-CM | POA: Diagnosis not present

## 2015-07-30 DIAGNOSIS — N2581 Secondary hyperparathyroidism of renal origin: Secondary | ICD-10-CM | POA: Diagnosis not present

## 2015-07-30 DIAGNOSIS — D631 Anemia in chronic kidney disease: Secondary | ICD-10-CM | POA: Diagnosis not present

## 2015-07-30 DIAGNOSIS — N186 End stage renal disease: Secondary | ICD-10-CM | POA: Diagnosis not present

## 2015-08-01 DIAGNOSIS — D631 Anemia in chronic kidney disease: Secondary | ICD-10-CM | POA: Diagnosis not present

## 2015-08-01 DIAGNOSIS — N2581 Secondary hyperparathyroidism of renal origin: Secondary | ICD-10-CM | POA: Diagnosis not present

## 2015-08-01 DIAGNOSIS — E1129 Type 2 diabetes mellitus with other diabetic kidney complication: Secondary | ICD-10-CM | POA: Diagnosis not present

## 2015-08-01 DIAGNOSIS — N186 End stage renal disease: Secondary | ICD-10-CM | POA: Diagnosis not present

## 2015-08-01 DIAGNOSIS — D509 Iron deficiency anemia, unspecified: Secondary | ICD-10-CM | POA: Diagnosis not present

## 2015-08-04 DIAGNOSIS — E1129 Type 2 diabetes mellitus with other diabetic kidney complication: Secondary | ICD-10-CM | POA: Diagnosis not present

## 2015-08-04 DIAGNOSIS — N2581 Secondary hyperparathyroidism of renal origin: Secondary | ICD-10-CM | POA: Diagnosis not present

## 2015-08-04 DIAGNOSIS — D631 Anemia in chronic kidney disease: Secondary | ICD-10-CM | POA: Diagnosis not present

## 2015-08-04 DIAGNOSIS — D509 Iron deficiency anemia, unspecified: Secondary | ICD-10-CM | POA: Diagnosis not present

## 2015-08-04 DIAGNOSIS — N186 End stage renal disease: Secondary | ICD-10-CM | POA: Diagnosis not present

## 2015-08-06 DIAGNOSIS — N186 End stage renal disease: Secondary | ICD-10-CM | POA: Diagnosis not present

## 2015-08-06 DIAGNOSIS — D509 Iron deficiency anemia, unspecified: Secondary | ICD-10-CM | POA: Diagnosis not present

## 2015-08-06 DIAGNOSIS — N2581 Secondary hyperparathyroidism of renal origin: Secondary | ICD-10-CM | POA: Diagnosis not present

## 2015-08-06 DIAGNOSIS — D631 Anemia in chronic kidney disease: Secondary | ICD-10-CM | POA: Diagnosis not present

## 2015-08-06 DIAGNOSIS — E1129 Type 2 diabetes mellitus with other diabetic kidney complication: Secondary | ICD-10-CM | POA: Diagnosis not present

## 2015-08-08 DIAGNOSIS — D509 Iron deficiency anemia, unspecified: Secondary | ICD-10-CM | POA: Diagnosis not present

## 2015-08-08 DIAGNOSIS — E1129 Type 2 diabetes mellitus with other diabetic kidney complication: Secondary | ICD-10-CM | POA: Diagnosis not present

## 2015-08-08 DIAGNOSIS — D631 Anemia in chronic kidney disease: Secondary | ICD-10-CM | POA: Diagnosis not present

## 2015-08-08 DIAGNOSIS — N2581 Secondary hyperparathyroidism of renal origin: Secondary | ICD-10-CM | POA: Diagnosis not present

## 2015-08-08 DIAGNOSIS — N186 End stage renal disease: Secondary | ICD-10-CM | POA: Diagnosis not present

## 2015-08-10 DIAGNOSIS — N186 End stage renal disease: Secondary | ICD-10-CM | POA: Diagnosis not present

## 2015-08-10 DIAGNOSIS — D631 Anemia in chronic kidney disease: Secondary | ICD-10-CM | POA: Diagnosis not present

## 2015-08-10 DIAGNOSIS — D509 Iron deficiency anemia, unspecified: Secondary | ICD-10-CM | POA: Diagnosis not present

## 2015-08-10 DIAGNOSIS — N2581 Secondary hyperparathyroidism of renal origin: Secondary | ICD-10-CM | POA: Diagnosis not present

## 2015-08-10 DIAGNOSIS — E1129 Type 2 diabetes mellitus with other diabetic kidney complication: Secondary | ICD-10-CM | POA: Diagnosis not present

## 2015-08-12 ENCOUNTER — Other Ambulatory Visit: Payer: Medicare Other | Admitting: Internal Medicine

## 2015-08-12 DIAGNOSIS — E1129 Type 2 diabetes mellitus with other diabetic kidney complication: Secondary | ICD-10-CM | POA: Diagnosis not present

## 2015-08-12 DIAGNOSIS — N2581 Secondary hyperparathyroidism of renal origin: Secondary | ICD-10-CM | POA: Diagnosis not present

## 2015-08-12 DIAGNOSIS — N186 End stage renal disease: Secondary | ICD-10-CM | POA: Diagnosis not present

## 2015-08-12 DIAGNOSIS — D631 Anemia in chronic kidney disease: Secondary | ICD-10-CM | POA: Diagnosis not present

## 2015-08-12 DIAGNOSIS — D509 Iron deficiency anemia, unspecified: Secondary | ICD-10-CM | POA: Diagnosis not present

## 2015-08-15 DIAGNOSIS — N186 End stage renal disease: Secondary | ICD-10-CM | POA: Diagnosis not present

## 2015-08-15 DIAGNOSIS — E1129 Type 2 diabetes mellitus with other diabetic kidney complication: Secondary | ICD-10-CM | POA: Diagnosis not present

## 2015-08-15 DIAGNOSIS — D509 Iron deficiency anemia, unspecified: Secondary | ICD-10-CM | POA: Diagnosis not present

## 2015-08-15 DIAGNOSIS — D631 Anemia in chronic kidney disease: Secondary | ICD-10-CM | POA: Diagnosis not present

## 2015-08-15 DIAGNOSIS — N2581 Secondary hyperparathyroidism of renal origin: Secondary | ICD-10-CM | POA: Diagnosis not present

## 2015-08-17 DIAGNOSIS — E1129 Type 2 diabetes mellitus with other diabetic kidney complication: Secondary | ICD-10-CM | POA: Diagnosis not present

## 2015-08-17 DIAGNOSIS — N186 End stage renal disease: Secondary | ICD-10-CM | POA: Diagnosis not present

## 2015-08-17 DIAGNOSIS — D631 Anemia in chronic kidney disease: Secondary | ICD-10-CM | POA: Diagnosis not present

## 2015-08-17 DIAGNOSIS — N2581 Secondary hyperparathyroidism of renal origin: Secondary | ICD-10-CM | POA: Diagnosis not present

## 2015-08-17 DIAGNOSIS — D509 Iron deficiency anemia, unspecified: Secondary | ICD-10-CM | POA: Diagnosis not present

## 2015-08-20 DIAGNOSIS — N2581 Secondary hyperparathyroidism of renal origin: Secondary | ICD-10-CM | POA: Diagnosis not present

## 2015-08-20 DIAGNOSIS — D509 Iron deficiency anemia, unspecified: Secondary | ICD-10-CM | POA: Diagnosis not present

## 2015-08-20 DIAGNOSIS — E1129 Type 2 diabetes mellitus with other diabetic kidney complication: Secondary | ICD-10-CM | POA: Diagnosis not present

## 2015-08-20 DIAGNOSIS — N186 End stage renal disease: Secondary | ICD-10-CM | POA: Diagnosis not present

## 2015-08-20 DIAGNOSIS — D631 Anemia in chronic kidney disease: Secondary | ICD-10-CM | POA: Diagnosis not present

## 2015-08-23 DIAGNOSIS — Z992 Dependence on renal dialysis: Secondary | ICD-10-CM | POA: Diagnosis not present

## 2015-08-23 DIAGNOSIS — N186 End stage renal disease: Secondary | ICD-10-CM | POA: Diagnosis not present

## 2015-08-24 DIAGNOSIS — Z992 Dependence on renal dialysis: Secondary | ICD-10-CM | POA: Diagnosis not present

## 2015-08-24 DIAGNOSIS — N186 End stage renal disease: Secondary | ICD-10-CM | POA: Diagnosis not present

## 2015-08-24 DIAGNOSIS — E1122 Type 2 diabetes mellitus with diabetic chronic kidney disease: Secondary | ICD-10-CM | POA: Diagnosis not present

## 2015-08-25 ENCOUNTER — Ambulatory Visit: Payer: Medicare Other | Admitting: Internal Medicine

## 2015-08-25 DIAGNOSIS — N186 End stage renal disease: Secondary | ICD-10-CM | POA: Diagnosis not present

## 2015-08-25 DIAGNOSIS — Z992 Dependence on renal dialysis: Secondary | ICD-10-CM | POA: Diagnosis not present

## 2015-08-25 DIAGNOSIS — D509 Iron deficiency anemia, unspecified: Secondary | ICD-10-CM | POA: Diagnosis not present

## 2015-08-27 DIAGNOSIS — Z992 Dependence on renal dialysis: Secondary | ICD-10-CM | POA: Diagnosis not present

## 2015-08-27 DIAGNOSIS — N186 End stage renal disease: Secondary | ICD-10-CM | POA: Diagnosis not present

## 2015-08-27 DIAGNOSIS — D509 Iron deficiency anemia, unspecified: Secondary | ICD-10-CM | POA: Diagnosis not present

## 2015-08-29 DIAGNOSIS — E1129 Type 2 diabetes mellitus with other diabetic kidney complication: Secondary | ICD-10-CM | POA: Diagnosis not present

## 2015-08-29 DIAGNOSIS — N186 End stage renal disease: Secondary | ICD-10-CM | POA: Diagnosis not present

## 2015-08-29 DIAGNOSIS — D509 Iron deficiency anemia, unspecified: Secondary | ICD-10-CM | POA: Diagnosis not present

## 2015-08-29 DIAGNOSIS — D631 Anemia in chronic kidney disease: Secondary | ICD-10-CM | POA: Diagnosis not present

## 2015-08-29 DIAGNOSIS — N2581 Secondary hyperparathyroidism of renal origin: Secondary | ICD-10-CM | POA: Diagnosis not present

## 2015-08-30 ENCOUNTER — Encounter: Payer: Self-pay | Admitting: Neurology

## 2015-08-30 ENCOUNTER — Ambulatory Visit (INDEPENDENT_AMBULATORY_CARE_PROVIDER_SITE_OTHER): Payer: Medicare Other | Admitting: Neurology

## 2015-08-30 VITALS — BP 112/52 | HR 62 | Resp 18 | Ht 69.0 in | Wt 212.0 lb

## 2015-08-30 DIAGNOSIS — R0681 Apnea, not elsewhere classified: Secondary | ICD-10-CM | POA: Diagnosis not present

## 2015-08-30 DIAGNOSIS — R351 Nocturia: Secondary | ICD-10-CM | POA: Diagnosis not present

## 2015-08-30 DIAGNOSIS — G4761 Periodic limb movement disorder: Secondary | ICD-10-CM

## 2015-08-30 DIAGNOSIS — G4719 Other hypersomnia: Secondary | ICD-10-CM | POA: Diagnosis not present

## 2015-08-30 DIAGNOSIS — E669 Obesity, unspecified: Secondary | ICD-10-CM

## 2015-08-30 DIAGNOSIS — R0683 Snoring: Secondary | ICD-10-CM | POA: Diagnosis not present

## 2015-08-30 DIAGNOSIS — I70209 Unspecified atherosclerosis of native arteries of extremities, unspecified extremity: Secondary | ICD-10-CM | POA: Diagnosis not present

## 2015-08-30 NOTE — Progress Notes (Signed)
Subjective:    Patient ID: [name] is a 74 y.o. male.  HPI     Star Age, MD, PhD [facility] [address] [place], [place] [address]  Dear [name],   I saw your patient, [name], upon your kind request in my clinic today for initial consultation of his sleep disorder, in particular, concern for underlying obstructive sleep apnea. The patient is unaccompanied today. Of note, the patient no showed for an appointment on 06/13/2015 and on 07/05/2015. As you know, [name] is a 74 year old right-handed gentleman with an underlying medical history of peripheral vascular disease, hypertension, hypothyroidism, hyperparathyroidism, diabetes, end-stage renal disease on dialysis, hyperlipidemia, B-12 deficiency (on injections), and obesity, who reports snoring and excessive daytime somnolence.  I reviewed your office note from 05/20/2015.  His ESS is 8/24 and his fatigue score is 44/63.  His bedtime is around 10-11 PM and rise time is between 8-10. He has lately been treated for an itchy scalp condition and has a special shampoo he uses per dermatologist.  He has been on HD since 6/16, on M/W/F and his wife has noted PLMs, while he denies RLS symptoms. He has nocturia once per night, but has very little urine at this time. He denies AM HAs. He endorses stress and worry.  He lives with his wife and has one grown daughter and no GC.  He does not drink alcohol (quit in 77) and quit smoking in 89 and very occasionally drinks coffee. He does not watch TV in bed.   His Past Medical History Is Significant For: Past Medical History  Diagnosis Date  . Hypertension   . Thyroid disease   . Renal insufficiency   . Hyperlipidemia   . ED (erectile dysfunction)   . Allergy   . 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 PVD (peripheral vascular disease) ([place])     has plastic aorta  . Arthritis   . Diverticulitis   . Seasonal allergies   . Hypothyroidism   .  GERD (gastroesophageal reflux disease)     pepto   . Gout   . Diabetes mellitus     Type 2  . Pneumonia   . Anemia     His Past Surgical History Is Significant For: Past Surgical History  Procedure Laterality Date  . Aortobifemoral bypass    . Breast surgery      left - granulomatous mastitis  . Sbo with lysis adhesions    . Eye surgery Bilateral     cataracts  . Colonoscopy    . Av fistula placement Left 12/01/2013    Procedure: ARTERIOVENOUS (AV) FISTULA CREATION- LEFT BRACHIOCEPHALIC;  Surgeon: Angelia Mould, MD;  Location: [place];  Service: Vascular;  Laterality: Left;  . Revison of arteriovenous fistula Left 02/09/2014    Procedure: REVISON OF LEFT ARTERIOVENOUS FISTULA - RESECTION OF RENDUNDANT VEIN;  Surgeon: Angelia Mould, MD;  Location: [place];  Service: Vascular;  Laterality: Left;  . Bascilic vein transposition Right 07/27/2014    Procedure: BASCILIC VEIN TRANSPOSITION;  Surgeon: Angelia Mould, MD;  Location: [place];  Service: Vascular;  Laterality: Right;  . Shuntogram Left 04/19/2014    Procedure: FISTULOGRAM;  Surgeon: Angelia Mould, MD;  Location: [facility] CATH LAB;  Service: Cardiovascular;  Laterality: Left;  . Unilateral upper extremeity angiogram N/A 07/12/2014    Procedure: UNILATERAL UPPER Anselmo Rod;  Surgeon: Angelia Mould, MD;  Location: [facility] CATH LAB;  Service: Cardiovascular;  Laterality: N/A;    His Family History Is Significant For: Family History  Problem Relation Age of Onset  . Aneurysm Mother   . Heart disease Father   . Stroke Father   . Hypertension Father   . Diabetes Father   . Dementia Neg Hx     His Social History Is Significant For: Social History   Social History  . Marital Status: Married    Spouse Name: [name] Mood  . Number of Children: 1  . Years of Education: 16   Occupational History  . Retired    Social History Main Topics  . Smoking status: Former Smoker    Quit date: 09/24/1994  .  Smokeless tobacco: Never Used  . Alcohol Use: No     Comment: Quit Oct. 1977 ("somewhat heavy")  . Drug Use: No  . Sexual Activity: Not on file   Other Topics Concern  . Not on file   Social History Narrative   Lives at home with wife.   Caffeine use: Drinks no soda or tea.    Drinks 1 cup coffee/week       His Allergies Are:  Allergies  Allergen Reactions  . Penicillins Rash  :   His Current Medications Are:  Outpatient Encounter Prescriptions as of 08/30/2015  Medication Sig  . acetaminophen (TYLENOL) 500 MG tablet Take 500 mg by mouth every 6 (six) hours as needed.  Marland Kitchen allopurinol (ZYLOPRIM) 100 MG tablet Take 100 mg by mouth daily.    Marland Kitchen aspirin 325 MG tablet Take 650 mg by mouth daily.   . calcitRIOL (ROCALTROL) 0.25 MCG capsule Take 0.25-0.5 mcg by mouth See admin instructions. Takes 2 capsules ever day  . carvedilol (COREG) 12.5 MG tablet Take 12.5 mg by mouth 2 (two) times daily with a meal.  . clobetasol (TEMOVATE) 0.05 % external solution apply to affected area ON THE SCALP UP TO 2 TIMES A DAY AS NEEDED...  (REFER TO PRESCRIPTION NOTES).  Marland Kitchen docusate sodium 100 MG CAPS Take 100 mg by mouth 2 (two) times daily.  Marland Kitchen donepezil (ARICEPT) 10 MG tablet Take 1 tablet (10 mg total) by mouth at bedtime.  Marland Kitchen FLUOCINOLONE ACETONIDE SCALP 0.01 % OIL apply to affected area ON SCALP EVERY NIGHT AT BEDTIME, [place] UPON AWAKENING  . fluticasone (CUTIVATE) 0.05 % cream apply to affected area ON FACE UP TO 2 TIMES A DAY AS NEEDED  . fluticasone (FLONASE) 50 MCG/ACT nasal spray Place 1 spray into both nostrils daily. (Patient taking differently: Place 1 spray into both nostrils daily as needed for allergies. )  . glipiZIDE (GLUCOTROL) 5 MG tablet Take 1 tablet (5 mg total) by mouth daily before breakfast.  . insulin glargine (LANTUS) 100 UNIT/ML injection Inject 0.1 mLs (10 Units total) into the skin daily.  . insulin regular (HUMULIN R) 100 units/mL injection 3 units Humulin R subcutaneous  before bedtime if Accu-Cheks are running over 300  . ketoconazole (NIZORAL) 2 % shampoo Apply 1 application topically 2 (two) times a week.  . levothyroxine (SYNTHROID, LEVOTHROID) 50 MCG tablet Take 1 tablet (50 mcg total) by mouth daily.  Marland Kitchen lidocaine-prilocaine (EMLA) cream   . loratadine (CLARITIN) 10 MG tablet Take 10 mg by mouth daily as needed for allergies.   . metroNIDAZOLE (METROGEL) 0.75 % gel APPLY TO AFFECTED AREA ON SKIN  ON FACE TWICE DAILY  . mupirocin ointment (BACTROBAN) 2 % Use as directed to affected areas  . NIFEdipine (PROCARDIA-XL/ADALAT CC) 60 MG 24 hr tablet Take 60  mg by mouth daily.    . pioglitazone (ACTOS) 30 MG tablet Take 1 tablet (30 mg total) by mouth daily.  Marland Kitchen RENVELA 800 MG tablet Take 800 mg by mouth 3 (three) times daily with meals. Takes 3  Tablets with each meal and 1 tablet with snack  . simvastatin (ZOCOR) 20 MG tablet Take 10 mg by mouth at bedtime.   . triamcinolone ointment (KENALOG) 0.1 % Apply 1 application topically 2 (two) times daily. Use between thighs  . [DISCONTINUED] donepezil (ARICEPT) 5 MG tablet Take 1 tablet (5 mg total) by mouth at bedtime.   No facility-administered encounter medications on file as of 08/30/2015.  :  Review of Systems:  Out of a complete 14 point review of systems, all are reviewed and negative with the exception of these symptoms as listed below:   Review of Systems  Constitutional: Positive for fatigue.  Eyes:       Blurred vision   Musculoskeletal: Positive for back pain.  Allergic/Immunologic: Positive for environmental allergies.  Neurological:       Has trouble falling and staying asleep, snoring, wife has noticed shaking in his legs, wakes up feeling tired, daytime tiredness, sometimes takes naps.   Psychiatric/Behavioral:       Depression, nervous, anxious.    Epworth Sleepiness Scale 0= would never doze 1= slight chance of dozing 2= moderate chance of dozing 3= high chance of dozing  Sitting and  reading:2 Watching TV:1 Sitting inactive in a public place (ex. Theater or meeting):1 As a passenger in a car for an hour without a break:1 Lying down to rest in the afternoon:1 Sitting and talking to someone:0 Sitting quietly after lunch (no alcohol):1 In a car, while stopped in traffic:1 Total:8 Objective:  Neurologic Exam  Physical Exam Physical Examination:   Filed Vitals:   08/30/15 0844  BP: 112/52  Pulse: 62  Resp: 18    General Examination: The patient is a very pleasant 74 y.o. male in no acute distress. He appears well-developed and well-nourished and well groomed.   HEENT: Normocephalic, atraumatic, pupils are equal, round and reactive to light and accommodation. Funduscopic exam is normal with sharp disc margins noted. Extraocular tracking is good without limitation to gaze excursion or nystagmus noted. Normal smooth pursuit is noted. Hearing is grossly intact. Tympanic membranes are clear bilaterally. Face is symmetric with normal facial animation and normal facial sensation. Speech is clear with no dysarthria noted. There is no hypophonia. There is no lip, neck/head, jaw or voice tremor. Neck is supple with full range of passive and active motion. There are no carotid bruits on auscultation. Oropharynx exam reveals: mild mouth dryness, adequate dental hygiene and moderate airway crowding, due to wider tongue and redundant soft palate and larger uvula. Mallampati is class II. Tongue protrudes centrally and palate elevates symmetrically. Tonsils are 1+ in size. Neck size is 15.5 inches. He has a Mild overbite. Nasal inspection reveals no significant nasal mucosal bogginess or redness and no septal deviation. He has nasal drainage. He has partials in the upper teeth.  Chest: Clear to auscultation without wheezing, rhonchi or crackles noted.  Heart: S1+S2+0, regular and normal without murmurs, rubs or gallops noted.   Abdomen: Soft, non-tender and non-distended with normal  bowel sounds appreciated on auscultation.  Extremities: There is trace pitting edema in the distal lower extremities bilaterally. Pedal pulses are intact.  Skin: Warm and dry without trophic changes noted. Healing sores in both shins. There are no varicose veins.  Musculoskeletal: exam reveals no obvious joint deformities, tenderness or joint swelling or erythema.   Neurologically:  Mental status: The patient is awake, alert and oriented in all 4 spheres. His immediate and remote memory, attention, language skills and fund of knowledge are appropriate. There is no evidence of aphasia, agnosia, apraxia or anomia. Speech is clear with normal prosody and enunciation. Thought process is linear. Mood is normal and affect is normal.  Cranial nerves II - XII are as described above under HEENT exam. In addition: shoulder shrug is normal with equal shoulder height noted. Motor exam: Normal bulk, strength and tone is noted. There is no drift, tremor or rebound. Romberg is negative. Reflexes are 2+ throughout. Fine motor skills and coordination: intact with normal finger taps, normal hand movements, normal rapid alternating patting, normal foot taps and normal foot agility.  Cerebellar testing: No dysmetria or intention tremor on finger to nose testing. Heel to shin is unremarkable bilaterally. There is no truncal or gait ataxia.  Sensory exam: intact to light touch, pinprick, vibration, temperature sense in the upper and lower extremities.  Gait, station and balance: He stands with difficulty. No veering to one side is noted. No leaning to one side is noted. Posture is age-appropriate and stance is narrow based. Gait shows normal stride length and normal pace. No problems turning are noted. He turns en bloc. Tandem walk is difficult for him.   Assessment and Plan:   In summary, THAI MACKILLOP is a very pleasant 74 y.o.-year old male with an underlying medical history of peripheral vascular disease,  hypertension, hypothyroidism, hyperparathyroidism, diabetes, end-stage renal disease on dialysis, hyperlipidemia, B-12 deficiency (on injections), and obesity, whose history and physical exam are indeed concerning for obstructive sleep apnea (OSA). I had a long chat with the patient about my findings and the diagnosis of OSA, its prognosis and treatment options. We talked about medical treatments, surgical interventions and non-pharmacological approaches. I explained in particular the risks and ramifications of untreated moderate to severe OSA, especially with respect to developing cardiovascular disease down the Road, including congestive heart failure, difficult to treat hypertension, cardiac arrhythmias, or stroke. Even type 2 diabetes has, in part, been linked to untreated OSA. Symptoms of untreated OSA include daytime sleepiness, memory problems, mood irritability and mood disorder such as depression and anxiety, lack of energy, as well as recurrent headaches, especially morning headaches. We talked about trying to maintain a healthy lifestyle in general, as well as the importance of weight control. I encouraged the patient to eat healthy, exercise daily and keep well hydrated, to keep a scheduled bedtime and wake time routine, to not skip any meals and eat healthy snacks in between meals. I advised the patient not to drive when feeling sleepy. I recommended the following at this time: sleep study with potential positive airway pressure titration. (We will score hypopneas at 4% and split the sleep study into diagnostic and treatment portion, if the estimated. 2 hour AHI is >15/h).   I explained the sleep test procedure to the patient and also outlined possible surgical and non-surgical treatment options of OSA, including the use of a custom-made dental device (which would require a referral to a specialist dentist or oral surgeon), upper airway surgical options, such as pillar implants, radiofrequency  surgery, tongue base surgery, and UPPP (which would involve a referral to an ENT surgeon). Rarely, jaw surgery such as mandibular advancement may be considered.  I also explained the CPAP treatment option to the patient, who  indicated that he would be willing to try CPAP if the need arises. I explained the importance of being compliant with PAP treatment, not only for insurance purposes but primarily to improve His symptoms, and for the patient's long term health benefit, including to reduce His cardiovascular risks. I answered all his questions today and the patient was in agreement. I would like to see him back after the sleep study is completed and encouraged him to call with any interim questions, concerns, problems or updates.   Thank you very much for allowing me to participate in the care of this nice patient. If I can be of any further assistance to you please do not hesitate to talk to me.  Sincerely,   Star Age, MD, PhD

## 2015-08-30 NOTE — Patient Instructions (Signed)
Based on your symptoms and your exam I believe you are at risk for obstructive sleep apnea or OSA, and I think we should proceed with a sleep study to determine whether you do or do not have OSA and how severe it is. If you have more than mild OSA, I want you to consider treatment with CPAP. Please remember, the risks and ramifications of moderate to severe obstructive sleep apnea or OSA are: Cardiovascular disease, including congestive heart failure, stroke, difficult to control hypertension, arrhythmias, and even type 2 diabetes has been linked to untreated OSA. Sleep apnea causes disruption of sleep and sleep deprivation in most cases, which, in turn, can cause recurrent headaches, problems with memory, mood, concentration, focus, and vigilance. Most people with untreated sleep apnea report excessive daytime sleepiness, which can affect their ability to drive. Please do not drive if you feel sleepy.   I will likely see you back after your sleep study to go over the test results and where to go from there. We will call you after your sleep study to advise about the results (most likely, you will hear from Beverlee Nims, my nurse) and to set up an appointment at the time, as necessary.    Our sleep lab administrative assistant, Arrie Aran will meet with you or call you to schedule your sleep study. If you don't hear back from her by next week please feel free to call her at [phone]. This is her direct line and please leave a message with your phone number to call back if you get the voicemail box. She will call back as soon as possible.   We can certainly do your study after the first of the year.

## 2015-08-31 DIAGNOSIS — N186 End stage renal disease: Secondary | ICD-10-CM | POA: Diagnosis not present

## 2015-08-31 DIAGNOSIS — E1129 Type 2 diabetes mellitus with other diabetic kidney complication: Secondary | ICD-10-CM | POA: Diagnosis not present

## 2015-08-31 DIAGNOSIS — D509 Iron deficiency anemia, unspecified: Secondary | ICD-10-CM | POA: Diagnosis not present

## 2015-08-31 DIAGNOSIS — N2581 Secondary hyperparathyroidism of renal origin: Secondary | ICD-10-CM | POA: Diagnosis not present

## 2015-08-31 DIAGNOSIS — D631 Anemia in chronic kidney disease: Secondary | ICD-10-CM | POA: Diagnosis not present

## 2015-09-01 ENCOUNTER — Ambulatory Visit: Payer: Medicare Other | Admitting: Internal Medicine

## 2015-09-02 ENCOUNTER — Other Ambulatory Visit: Payer: Medicare Other | Admitting: Internal Medicine

## 2015-09-02 DIAGNOSIS — N185 Chronic kidney disease, stage 5: Secondary | ICD-10-CM

## 2015-09-02 DIAGNOSIS — Z79899 Other long term (current) drug therapy: Secondary | ICD-10-CM | POA: Diagnosis not present

## 2015-09-02 DIAGNOSIS — N2581 Secondary hyperparathyroidism of renal origin: Secondary | ICD-10-CM

## 2015-09-02 DIAGNOSIS — E785 Hyperlipidemia, unspecified: Secondary | ICD-10-CM | POA: Diagnosis not present

## 2015-09-02 DIAGNOSIS — I10 Essential (primary) hypertension: Secondary | ICD-10-CM

## 2015-09-02 DIAGNOSIS — E1129 Type 2 diabetes mellitus with other diabetic kidney complication: Secondary | ICD-10-CM | POA: Diagnosis not present

## 2015-09-02 DIAGNOSIS — E119 Type 2 diabetes mellitus without complications: Secondary | ICD-10-CM | POA: Diagnosis not present

## 2015-09-02 DIAGNOSIS — D631 Anemia in chronic kidney disease: Secondary | ICD-10-CM | POA: Diagnosis not present

## 2015-09-02 DIAGNOSIS — D509 Iron deficiency anemia, unspecified: Secondary | ICD-10-CM | POA: Diagnosis not present

## 2015-09-02 DIAGNOSIS — N186 End stage renal disease: Secondary | ICD-10-CM | POA: Diagnosis not present

## 2015-09-02 LAB — HEPATIC FUNCTION PANEL
ALT: 9 U/L (ref 9–46)
AST: 22 U/L (ref 10–35)
Albumin: 3.2 g/dL — ABNORMAL LOW (ref 3.6–5.1)
Alkaline Phosphatase: 99 U/L (ref 40–115)
Bilirubin, Direct: 0.1 mg/dL (ref <=0.2)
Indirect Bilirubin: 0.5 mg/dL (ref 0.2–1.2)
Total Bilirubin: 0.6 mg/dL (ref 0.2–1.2)
Total Protein: 5.9 g/dL — ABNORMAL LOW (ref 6.1–8.1)

## 2015-09-02 LAB — LIPID PANEL
Cholesterol: 85 mg/dL — ABNORMAL LOW (ref 125–200)
HDL: 33 mg/dL — ABNORMAL LOW (ref >=40)
LDL Cholesterol: 37 mg/dL (ref <130)
Total CHOL/HDL Ratio: 2.6 Ratio (ref <=5.0)
Triglycerides: 74 mg/dL (ref <150)
VLDL: 15 mg/dL (ref <30)

## 2015-09-02 LAB — TSH: TSH: 1.285 u[IU]/mL (ref 0.350–4.500)

## 2015-09-02 LAB — HEMOGLOBIN A1C
Hgb A1c MFr Bld: 8.9 % — ABNORMAL HIGH (ref <5.7)
Mean Plasma Glucose: 209 mg/dL — ABNORMAL HIGH (ref <117)

## 2015-09-05 DIAGNOSIS — N2581 Secondary hyperparathyroidism of renal origin: Secondary | ICD-10-CM | POA: Diagnosis not present

## 2015-09-05 DIAGNOSIS — D631 Anemia in chronic kidney disease: Secondary | ICD-10-CM | POA: Diagnosis not present

## 2015-09-05 DIAGNOSIS — N186 End stage renal disease: Secondary | ICD-10-CM | POA: Diagnosis not present

## 2015-09-05 DIAGNOSIS — D509 Iron deficiency anemia, unspecified: Secondary | ICD-10-CM | POA: Diagnosis not present

## 2015-09-05 DIAGNOSIS — E1129 Type 2 diabetes mellitus with other diabetic kidney complication: Secondary | ICD-10-CM | POA: Diagnosis not present

## 2015-09-06 ENCOUNTER — Ambulatory Visit: Payer: Medicare Other | Admitting: Internal Medicine

## 2015-09-07 DIAGNOSIS — N186 End stage renal disease: Secondary | ICD-10-CM | POA: Diagnosis not present

## 2015-09-07 DIAGNOSIS — D509 Iron deficiency anemia, unspecified: Secondary | ICD-10-CM | POA: Diagnosis not present

## 2015-09-07 DIAGNOSIS — D631 Anemia in chronic kidney disease: Secondary | ICD-10-CM | POA: Diagnosis not present

## 2015-09-07 DIAGNOSIS — N2581 Secondary hyperparathyroidism of renal origin: Secondary | ICD-10-CM | POA: Diagnosis not present

## 2015-09-07 DIAGNOSIS — E1129 Type 2 diabetes mellitus with other diabetic kidney complication: Secondary | ICD-10-CM | POA: Diagnosis not present

## 2015-09-09 DIAGNOSIS — D509 Iron deficiency anemia, unspecified: Secondary | ICD-10-CM | POA: Diagnosis not present

## 2015-09-09 DIAGNOSIS — E1129 Type 2 diabetes mellitus with other diabetic kidney complication: Secondary | ICD-10-CM | POA: Diagnosis not present

## 2015-09-09 DIAGNOSIS — N186 End stage renal disease: Secondary | ICD-10-CM | POA: Diagnosis not present

## 2015-09-09 DIAGNOSIS — N2581 Secondary hyperparathyroidism of renal origin: Secondary | ICD-10-CM | POA: Diagnosis not present

## 2015-09-09 DIAGNOSIS — D631 Anemia in chronic kidney disease: Secondary | ICD-10-CM | POA: Diagnosis not present

## 2015-09-12 ENCOUNTER — Ambulatory Visit (INDEPENDENT_AMBULATORY_CARE_PROVIDER_SITE_OTHER): Payer: Medicare Other | Admitting: Internal Medicine

## 2015-09-12 ENCOUNTER — Encounter: Payer: Self-pay | Admitting: Internal Medicine

## 2015-09-12 VITALS — BP 132/72 | HR 62 | Temp 97.0°F | Resp 20 | Ht 69.0 in | Wt 214.0 lb

## 2015-09-12 DIAGNOSIS — E039 Hypothyroidism, unspecified: Secondary | ICD-10-CM

## 2015-09-12 DIAGNOSIS — E119 Type 2 diabetes mellitus without complications: Secondary | ICD-10-CM | POA: Diagnosis not present

## 2015-09-12 DIAGNOSIS — B353 Tinea pedis: Secondary | ICD-10-CM | POA: Diagnosis not present

## 2015-09-12 DIAGNOSIS — Z23 Encounter for immunization: Secondary | ICD-10-CM | POA: Diagnosis not present

## 2015-09-12 DIAGNOSIS — D631 Anemia in chronic kidney disease: Secondary | ICD-10-CM | POA: Diagnosis not present

## 2015-09-12 DIAGNOSIS — E785 Hyperlipidemia, unspecified: Secondary | ICD-10-CM | POA: Diagnosis not present

## 2015-09-12 DIAGNOSIS — R413 Other amnesia: Secondary | ICD-10-CM | POA: Diagnosis not present

## 2015-09-12 DIAGNOSIS — E1129 Type 2 diabetes mellitus with other diabetic kidney complication: Secondary | ICD-10-CM | POA: Diagnosis not present

## 2015-09-12 DIAGNOSIS — I70209 Unspecified atherosclerosis of native arteries of extremities, unspecified extremity: Secondary | ICD-10-CM

## 2015-09-12 DIAGNOSIS — Z794 Long term (current) use of insulin: Secondary | ICD-10-CM

## 2015-09-12 DIAGNOSIS — Z992 Dependence on renal dialysis: Secondary | ICD-10-CM | POA: Diagnosis not present

## 2015-09-12 DIAGNOSIS — N186 End stage renal disease: Secondary | ICD-10-CM | POA: Diagnosis not present

## 2015-09-12 DIAGNOSIS — D509 Iron deficiency anemia, unspecified: Secondary | ICD-10-CM | POA: Diagnosis not present

## 2015-09-12 DIAGNOSIS — IMO0001 Reserved for inherently not codable concepts without codable children: Secondary | ICD-10-CM

## 2015-09-12 DIAGNOSIS — I10 Essential (primary) hypertension: Secondary | ICD-10-CM

## 2015-09-12 DIAGNOSIS — N2581 Secondary hyperparathyroidism of renal origin: Secondary | ICD-10-CM | POA: Diagnosis not present

## 2015-09-12 MED ORDER — NAFTIFINE HCL 2 % EX CREA: TOPICAL_CREAM | CUTANEOUS | Status: DC

## 2015-09-12 NOTE — Patient Instructions (Addendum)
Prevnar given today. Return in 4 months for office visit A1c. Return to endocrinologist. Naftin cream daily for tinea pedis. Please check Accu-Cheks on a more regular basis and watch diet.

## 2015-09-12 NOTE — Progress Notes (Signed)
   Subjective:    Patient ID: [name], male    DOB: 1941-05-11, 74 y.o.   MRN: [id number]  HPI 74 year old Black Male with history of end-stage renal disease on dialysis followed by Dr. Lorrene Reid. Says he's been going to [facility] clinic to try to get kidney transplant. He continues to have difficulty remembering his appointments even though we call him in mid pain. This is concerning to me. Today he says it's Tuesday instead of Monday but knows the year. Continues to drive. Issues with memory started several months ago when he was hospitalized with end-stage renal disease. His hemoglobin A1c is 8.9% and he has not returned endocrinologist lately. He is on Lantus insulin and Actos as well as glipizide. He supposed to take 3 units of regular insulin at bedtime if Accu-Chek is over 300 but he hasn't been checking Accu-Cheks in the evening. Ileus checking it about once a day and says it's running about 150 in the morning. He also has history of hypothyroidism. TSH will be checked. Says he doesn't have very good appetite. Albumin and total protein are low.   Review of Systems no new complaints     Objective:   Physical Exam  Neck is supple without JVD thyromegaly or carotid bruits. Chest clear to auscultation. Cardiac exam regular rate and rhythm. Extremities without edema. He has bilateral tinea pedis.      Assessment & Plan:  Insulin-dependent diabetes mellitus  End-stage kidney disease on dialysis seeking kidney transplant  Memory loss  Hypertension  Hypothyroidism  Tinea pedis-prescribed Naftin cream to feet daily  Plan: Return in 4 months for follow-up. See endocrinologist in the near future. Is to check Accu-Cheks more frequently. Continue to monitor his memory issues.

## 2015-09-13 ENCOUNTER — Institutional Professional Consult (permissible substitution): Payer: Medicare Other

## 2015-09-13 ENCOUNTER — Ambulatory Visit (INDEPENDENT_AMBULATORY_CARE_PROVIDER_SITE_OTHER): Payer: Medicare Other | Admitting: Endocrinology

## 2015-09-13 ENCOUNTER — Encounter: Payer: Self-pay | Admitting: Endocrinology

## 2015-09-13 VITALS — BP 100/48 | HR 58 | Temp 98.2°F | Resp 14 | Ht 69.0 in | Wt 211.0 lb

## 2015-09-13 DIAGNOSIS — L738 Other specified follicular disorders: Secondary | ICD-10-CM | POA: Diagnosis not present

## 2015-09-13 DIAGNOSIS — Z794 Long term (current) use of insulin: Secondary | ICD-10-CM

## 2015-09-13 DIAGNOSIS — E039 Hypothyroidism, unspecified: Secondary | ICD-10-CM | POA: Diagnosis not present

## 2015-09-13 DIAGNOSIS — E1165 Type 2 diabetes mellitus with hyperglycemia: Secondary | ICD-10-CM

## 2015-09-13 DIAGNOSIS — L218 Other seborrheic dermatitis: Secondary | ICD-10-CM | POA: Diagnosis not present

## 2015-09-13 DIAGNOSIS — I70209 Unspecified atherosclerosis of native arteries of extremities, unspecified extremity: Secondary | ICD-10-CM

## 2015-09-13 NOTE — Progress Notes (Signed)
Patient ID: [name], male   DOB: July 11, 1941, 74 y.o.   MRN: [id number]           Reason for Appointment:  Follow-up for Type 2 Diabetes  Referring physician: [place]  History of Present Illness:          Date of diagnosis of type 2 diabetes mellitus :        Background history:  He has had long-standing diabetes probably treated with metformin initially and subsequently with sulfonylurea drugs At some point he was also given Actos in addition to his glipizide which he is still taking Records of his control are only available for the last 4 years Over the last few years his A1c has been generally in the 6-7% range His A1c was 6.2 in May and apparently was starting to get low normal blood sugars at times He was admitted to the hospital for fluid overload and renal failure and at that time his Actos was stopped  Recent history:   Subsequently his blood sugars have been markedly increased He  had only been taking Regular Insulin as needed for his high sugars without any basal insulin on his initial consultation and he was told to start 10 units of Lantus He was also told to start back on Actos  However he had not been seen in follow-up since his initial consultation He is now referred back by his PCP because his A1c is still relatively high at 8.9, previously 9%  INSULIN regimen is described as: Lantus 13 units in am; R insulin 10 units ac bid        Current blood sugar patterns and problems identified:  He did not bring his monitor for download and probably checking infrequently at home, does not remember his readings well  He thinks his blood sugars are relatively better in the mornings, he did go up 2 units on his Lantus since  starting this.  Prior to starting Lantus he was getting readings over 300 at times  He is checking some readings before supper and these are relatively high area though he does not think he is eating more than some crackers at lunch  He does not have  any edema from taking Actos, does have end-stage renal disease  His mealtimes and diet are somewhat variable but he thinks that his portions are relatively small usually  Oral hypoglycemic drugs the patient is taking are: Actos 30 mg daily in a.m.     Side effects from medications have been: None  Compliance with the medical regimen: Good  Hypoglycemia:   none recently  Glucose monitoring:  done 2  times a day         Glucometer:  Accu-Chek Blood Glucose readings   Mean values apply above for all meters except median for One Touch  PRE-MEAL Fasting Lunch Dinner Bedtime Overall  Glucose range: 109  168-200    Mean/median:         Self-care: The diet that the patient has been following is: tries to limit sweets, portions. eating out only about once a week at a steak house        Typical meal intake: Breakfast is bacon/ bagel or eggs and toast, usually light lunch, mostly snacks or half sandwich               Dietician visit, most recent: ?               Exercise: none, not able to do  much    Weight history:  Wt Readings from Last 3 Encounters:  09/13/15 211 lb (95.709 kg)  09/12/15 214 lb (97.07 kg)  08/30/15 212 lb (96.163 kg)    Glycemic control:   Lab Results  Component Value Date   HGBA1C 8.9* 09/02/2015   HGBA1C 9.0* 05/05/2015   HGBA1C 6.2* 02/18/2015   Lab Results  Component Value Date   MICROALBUR 354.8* 02/14/2015   LDLCALC 37 09/02/2015   CREATININE 3.57* 02/24/2015         Medication List       This list is accurate as of: 09/13/15  2:29 PM.  Always use your most recent med list.               acetaminophen 500 MG tablet  Commonly known as:  TYLENOL  Take 500 mg by mouth every 6 (six) hours as needed.     allopurinol 100 MG tablet  Commonly known as:  ZYLOPRIM  Take 100 mg by mouth daily.     aspirin 325 MG tablet  Take 650 mg by mouth daily.     calcitRIOL 0.25 MCG capsule  Commonly known as:  ROCALTROL  Take 0.25-0.5 mcg by mouth  See admin instructions. Takes 2 capsules ever day     carvedilol 12.5 MG tablet  Commonly known as:  COREG  Take 12.5 mg by mouth 2 (two) times daily with a meal.     clobetasol 0.05 % external solution  Commonly known as:  TEMOVATE  apply to affected area ON THE SCALP UP TO 2 TIMES A DAY AS NEEDED...  (REFER TO PRESCRIPTION NOTES).     donepezil 10 MG tablet  Commonly known as:  ARICEPT  Take 1 tablet (10 mg total) by mouth at bedtime.     DSS 100 MG Caps  Take 100 mg by mouth 2 (two) times daily.     FLUOCINOLONE ACETONIDE SCALP 0.01 % Oil  apply to affected area ON SCALP EVERY NIGHT AT BEDTIME, WASH UPON AWAKENING     fluticasone 0.05 % cream  Commonly known as:  CUTIVATE  apply to affected area ON FACE UP TO 2 TIMES A DAY AS NEEDED     fluticasone 50 MCG/ACT nasal spray  Commonly known as:  FLONASE  Place 1 spray into both nostrils daily.     glipiZIDE 5 MG tablet  Commonly known as:  GLUCOTROL  Take 1 tablet (5 mg total) by mouth daily before breakfast.     insulin glargine 100 UNIT/ML injection  Commonly known as:  LANTUS  Inject 0.1 mLs (10 Units total) into the skin daily.     insulin regular 100 units/mL injection  Commonly known as:  HUMULIN R  3 units Humulin R subcutaneous before bedtime if Accu-Cheks are running over 300     ketoconazole 2 % shampoo  Commonly known as:  NIZORAL  Apply 1 application topically 2 (two) times a week.     levothyroxine 50 MCG tablet  Commonly known as:  SYNTHROID, LEVOTHROID  Take 1 tablet (50 mcg total) by mouth daily.     lidocaine-prilocaine cream  Commonly known as:  EMLA     loratadine 10 MG tablet  Commonly known as:  CLARITIN  Take 10 mg by mouth daily as needed for allergies.     metroNIDAZOLE 0.75 % gel  Commonly known as:  METROGEL  APPLY TO AFFECTED AREA ON SKIN  ON FACE TWICE DAILY     mupirocin ointment 2 %  Commonly known as:  BACTROBAN  Use as directed to affected areas     Naftifine HCl 2 % Crea    Commonly known as:  NAFTIN  Apply to feet daily for fungal infection     NIFEdipine 60 MG 24 hr tablet  Commonly known as:  PROCARDIA-XL/ADALAT CC  Take 60 mg by mouth daily.     pioglitazone 30 MG tablet  Commonly known as:  ACTOS  Take 1 tablet (30 mg total) by mouth daily.     RENVELA 800 MG tablet  Generic drug:  sevelamer carbonate  Take 800 mg by mouth 3 (three) times daily with meals. Takes 3  Tablets with each meal and 1 tablet with snack     simvastatin 20 MG tablet  Commonly known as:  ZOCOR  Take 10 mg by mouth at bedtime.     triamcinolone ointment 0.1 %  Commonly known as:  KENALOG  Apply 1 application topically 2 (two) times daily. Use between thighs        Allergies:  Allergies  Allergen Reactions  . Penicillins Rash    Past Medical History  Diagnosis Date  . Hypertension   . Thyroid disease   . Renal insufficiency   . Hyperlipidemia   . ED (erectile dysfunction)   . Allergy   . 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 PVD (peripheral vascular disease) ([place])     has plastic aorta  . Arthritis   . Diverticulitis   . Seasonal allergies   . Hypothyroidism   . GERD (gastroesophageal reflux disease)     pepto   . Gout   . Diabetes mellitus     Type 2  . Pneumonia   . Anemia     Past Surgical History  Procedure Laterality Date  . Aortobifemoral bypass    . Breast surgery      left - granulomatous mastitis  . Sbo with lysis adhesions    . Eye surgery Bilateral     cataracts  . Colonoscopy    . Av fistula placement Left 12/01/2013    Procedure: ARTERIOVENOUS (AV) FISTULA CREATION- LEFT BRACHIOCEPHALIC;  Surgeon: Angelia Mould, MD;  Location: [facility];  Service: Vascular;  Laterality: Left;  . [place]ison of arteriovenous fistula Left 02/09/2014    Procedure: REVISON OF LEFT ARTERIOVENOUS FISTULA - RESECTION OF RENDUNDANT VEIN;  Surgeon: Angelia Mould, MD;  Location: [facility];  Service: Vascular;  Laterality: Left;  . Bascilic vein transposition  Right 07/27/2014    Procedure: BASCILIC VEIN TRANSPOSITION;  Surgeon: Angelia Mould, MD;  Location: [facility];  Service: Vascular;  Laterality: Right;  . Shuntogram Left 04/19/2014    Procedure: FISTULOGRAM;  Surgeon: Angelia Mould, MD;  Location: [facility] CATH LAB;  Service: Cardiovascular;  Laterality: Left;  . Unilateral upper extremeity angiogram N/A 07/12/2014    Procedure: UNILATERAL UPPER Anselmo Rod;  Surgeon: Angelia Mould, MD;  Location: [facility] CATH LAB;  Service: Cardiovascular;  Laterality: N/A;    Family History  Problem Relation Age of Onset  . Aneurysm Mother   . Heart disease Father   . Stroke Father   . Hypertension Father   . Diabetes Father   . Dementia Neg Hx     Social History:  reports that he quit smoking about 20 years ago. He has never used smokeless tobacco. He reports that he does not drink alcohol or use illicit drugs.    Review of Systems    Lipid history: He has been  treated with 20 mg Zocor, not clear what baseline levels were    Lab Results  Component Value Date   CHOL 85* 09/02/2015   HDL 33* 09/02/2015   LDLCALC 37 09/02/2015   TRIG 74 09/02/2015   CHOLHDL 2.6 09/02/2015            Eyes: no history of blurred vision.  Most recent eye exam was 2015  Hypertension: was started on treatment in the 1980s    ?  Hypothyroidism: His TSH is recently normal  Lab Results  Component Value Date   TSH 1.285 09/02/2015   TSH 1.526 04/04/2015   TSH 2.946 02/18/2015     LABS:  No visits with results within 1 Week(s) from this visit. Latest known visit with results is:  Lab on 09/02/2015  Component Date Value Ref Range Status  . Hgb A1c MFr Bld 09/02/2015 8.9* <5.7 % Final   Comment:                                                                        According to the ADA Clinical Practice Recommendations for 2011, when HbA1c is used as a screening test:     >=6.5%   Diagnostic of Diabetes Mellitus            (if  abnormal result is confirmed)   5.7-6.4%   Increased risk of developing Diabetes Mellitus   References:Diagnosis and Classification of Diabetes Mellitus,Diabetes [id number] 1):S62-S69 and Standards of Medical Care in         Diabetes - 2011,Diabetes [id number] (Suppl 1):S11-S61.     . Mean Plasma Glucose 09/02/2015 209* <117 mg/dL Final  . Cholesterol 09/02/2015 85* 125 - 200 mg/dL Final  . Triglycerides 09/02/2015 74  <150 mg/dL Final  . HDL 09/02/2015 33* >=40 mg/dL Final  . Total CHOL/HDL Ratio 09/02/2015 2.6  <=5.0 Ratio Final  . VLDL 09/02/2015 15  <30 mg/dL Final  . LDL Cholesterol 09/02/2015 37  <130 mg/dL Final   Comment:   Total Cholesterol/HDL Ratio:CHD Risk                        Coronary Heart Disease Risk Table                                        Men       Women          1/2 Average Risk              3.4        3.3              Average Risk              5.0        4.4           2X Average Risk              9.6        7.1           3X Average Risk  23.4       11.0 Use the calculated Patient Ratio above and the CHD Risk table  to determine the patient's CHD Risk.   . Total Bilirubin 09/02/2015 0.6  0.2 - 1.2 mg/dL Final  . Bilirubin, Direct 09/02/2015 0.1  <=0.2 mg/dL Final  . Indirect Bilirubin 09/02/2015 0.5  0.2 - 1.2 mg/dL Final  . Alkaline Phosphatase 09/02/2015 99  40 - 115 U/L Final  . AST 09/02/2015 22  10 - 35 U/L Final  . ALT 09/02/2015 9  9 - 46 U/L Final  . Total Protein 09/02/2015 5.9* 6.1 - 8.1 g/dL Final  . Albumin 09/02/2015 3.2* 3.6 - 5.1 g/dL Final  . TSH 09/02/2015 1.285  0.350 - 4.500 uIU/mL Final    Physical Examination:  BP 100/48 mmHg  Pulse 58  Temp(Src) 98.2 F (36.8 C)  Resp 14  Ht 5\' 9"  (1.753 m)  Wt 211 lb (95.709 kg)  BMI 31.15 kg/m2  SpO2 96%      ASSESSMENT:  Diabetes type 2, uncontrolled with A1c of 8.9% See history of present illness for detailed discussion of his current management, blood sugar  patterns and problems identified He has had much better blood sugars with starting Lantus as does by his home blood sugar monitoring; however he is not checking readings often and not clear if his recall of his blood sugars is accurate  Today his blood sugar is 124 in the office after drinking a protein drink only  He has reportedly good readings in the mornings but not consistent but also may have high readings in the afternoons despite not eating much during the day  PLAN:   He will check his blood sugars consistently at least twice a day at various times including after meals and bring his monitor for download  His blood sugar patterns will be analyzed with continuous glucose monitoring using the [employer] [place] sensor.  This was inserted on the deltoid area of the right arm with the insertion device without any difficulty.  He was given a blood sugar and food diary to keep a log of his meals.  Explained how this interval analyze his blood sugars and he will be back in 2 weeks for analysis and review  Discussed blood sugar targets at various times  For now will not change his insulin  Check fructosamine to confirm that his blood sugars are consistently high also recently  Continue Actos  Encouraged him to be active and walk more  Consider consultation with dietitian  May consider taking Lantus in the morning if blood sugars tend to be higher during the day and lower fasting    Patient Instructions  Check blood sugars on waking up 3  times a week Also check blood sugars about 2 hours after a meal and do this after different meals by rotation  Recommended blood sugar levels on waking up is 90-130 and about 2 hours after meal is 130-160  Please bring your blood sugar monitor to each visit, thank you  Also keep a diary as requested for the continuous glucose monitoring and enter  blood sugars also in the diary   Counseling time on subjects discussed above is over 50% of today's  25 minute  visit   Kable Haywood 09/13/2015, 2:29 PM   Note: This office note was prepared with Dragon voice recognition system technology. Any transcriptional errors that result from this process are unintentional.

## 2015-09-13 NOTE — Patient Instructions (Addendum)
Check blood sugars on waking up 3  times a week Also check blood sugars about 2 hours after a meal and do this after different meals by rotation  Recommended blood sugar levels on waking up is 90-130 and about 2 hours after meal is 130-160  Please bring your blood sugar monitor to each visit, thank you  Also keep a diary as requested for the continuous glucose monitoring and enter  blood sugars also in the diary

## 2015-09-14 DIAGNOSIS — N2581 Secondary hyperparathyroidism of renal origin: Secondary | ICD-10-CM | POA: Diagnosis not present

## 2015-09-14 DIAGNOSIS — E1129 Type 2 diabetes mellitus with other diabetic kidney complication: Secondary | ICD-10-CM | POA: Diagnosis not present

## 2015-09-14 DIAGNOSIS — D631 Anemia in chronic kidney disease: Secondary | ICD-10-CM | POA: Diagnosis not present

## 2015-09-14 DIAGNOSIS — N186 End stage renal disease: Secondary | ICD-10-CM | POA: Diagnosis not present

## 2015-09-14 DIAGNOSIS — D509 Iron deficiency anemia, unspecified: Secondary | ICD-10-CM | POA: Diagnosis not present

## 2015-09-16 DIAGNOSIS — D631 Anemia in chronic kidney disease: Secondary | ICD-10-CM | POA: Diagnosis not present

## 2015-09-16 DIAGNOSIS — D509 Iron deficiency anemia, unspecified: Secondary | ICD-10-CM | POA: Diagnosis not present

## 2015-09-16 DIAGNOSIS — N2581 Secondary hyperparathyroidism of renal origin: Secondary | ICD-10-CM | POA: Diagnosis not present

## 2015-09-16 DIAGNOSIS — E1129 Type 2 diabetes mellitus with other diabetic kidney complication: Secondary | ICD-10-CM | POA: Diagnosis not present

## 2015-09-16 DIAGNOSIS — N186 End stage renal disease: Secondary | ICD-10-CM | POA: Diagnosis not present

## 2015-09-19 DIAGNOSIS — N2581 Secondary hyperparathyroidism of renal origin: Secondary | ICD-10-CM | POA: Diagnosis not present

## 2015-09-19 DIAGNOSIS — N186 End stage renal disease: Secondary | ICD-10-CM | POA: Diagnosis not present

## 2015-09-19 DIAGNOSIS — D509 Iron deficiency anemia, unspecified: Secondary | ICD-10-CM | POA: Diagnosis not present

## 2015-09-19 DIAGNOSIS — E1129 Type 2 diabetes mellitus with other diabetic kidney complication: Secondary | ICD-10-CM | POA: Diagnosis not present

## 2015-09-19 DIAGNOSIS — D631 Anemia in chronic kidney disease: Secondary | ICD-10-CM | POA: Diagnosis not present

## 2015-09-21 DIAGNOSIS — N2581 Secondary hyperparathyroidism of renal origin: Secondary | ICD-10-CM | POA: Diagnosis not present

## 2015-09-21 DIAGNOSIS — E1129 Type 2 diabetes mellitus with other diabetic kidney complication: Secondary | ICD-10-CM | POA: Diagnosis not present

## 2015-09-21 DIAGNOSIS — D509 Iron deficiency anemia, unspecified: Secondary | ICD-10-CM | POA: Diagnosis not present

## 2015-09-21 DIAGNOSIS — D631 Anemia in chronic kidney disease: Secondary | ICD-10-CM | POA: Diagnosis not present

## 2015-09-21 DIAGNOSIS — N186 End stage renal disease: Secondary | ICD-10-CM | POA: Diagnosis not present

## 2015-09-23 DIAGNOSIS — N186 End stage renal disease: Secondary | ICD-10-CM | POA: Diagnosis not present

## 2015-09-23 DIAGNOSIS — D509 Iron deficiency anemia, unspecified: Secondary | ICD-10-CM | POA: Diagnosis not present

## 2015-09-23 DIAGNOSIS — D631 Anemia in chronic kidney disease: Secondary | ICD-10-CM | POA: Diagnosis not present

## 2015-09-23 DIAGNOSIS — N2581 Secondary hyperparathyroidism of renal origin: Secondary | ICD-10-CM | POA: Diagnosis not present

## 2015-09-23 DIAGNOSIS — E1129 Type 2 diabetes mellitus with other diabetic kidney complication: Secondary | ICD-10-CM | POA: Diagnosis not present

## 2015-09-24 DIAGNOSIS — E1122 Type 2 diabetes mellitus with diabetic chronic kidney disease: Secondary | ICD-10-CM | POA: Diagnosis not present

## 2015-09-24 DIAGNOSIS — Z992 Dependence on renal dialysis: Secondary | ICD-10-CM | POA: Diagnosis not present

## 2015-09-24 DIAGNOSIS — N186 End stage renal disease: Secondary | ICD-10-CM | POA: Diagnosis not present

## 2015-09-26 DIAGNOSIS — E1129 Type 2 diabetes mellitus with other diabetic kidney complication: Secondary | ICD-10-CM | POA: Diagnosis not present

## 2015-09-26 DIAGNOSIS — N2581 Secondary hyperparathyroidism of renal origin: Secondary | ICD-10-CM | POA: Diagnosis not present

## 2015-09-26 DIAGNOSIS — D509 Iron deficiency anemia, unspecified: Secondary | ICD-10-CM | POA: Diagnosis not present

## 2015-09-26 DIAGNOSIS — D631 Anemia in chronic kidney disease: Secondary | ICD-10-CM | POA: Diagnosis not present

## 2015-09-26 DIAGNOSIS — N186 End stage renal disease: Secondary | ICD-10-CM | POA: Diagnosis not present

## 2015-09-28 DIAGNOSIS — D631 Anemia in chronic kidney disease: Secondary | ICD-10-CM | POA: Diagnosis not present

## 2015-09-28 DIAGNOSIS — N186 End stage renal disease: Secondary | ICD-10-CM | POA: Diagnosis not present

## 2015-09-28 DIAGNOSIS — E1129 Type 2 diabetes mellitus with other diabetic kidney complication: Secondary | ICD-10-CM | POA: Diagnosis not present

## 2015-09-28 DIAGNOSIS — N2581 Secondary hyperparathyroidism of renal origin: Secondary | ICD-10-CM | POA: Diagnosis not present

## 2015-09-28 DIAGNOSIS — D509 Iron deficiency anemia, unspecified: Secondary | ICD-10-CM | POA: Diagnosis not present

## 2015-09-28 DIAGNOSIS — L218 Other seborrheic dermatitis: Secondary | ICD-10-CM | POA: Diagnosis not present

## 2015-09-29 ENCOUNTER — Ambulatory Visit: Payer: Medicare Other | Admitting: Endocrinology

## 2015-10-01 DIAGNOSIS — N2581 Secondary hyperparathyroidism of renal origin: Secondary | ICD-10-CM | POA: Diagnosis not present

## 2015-10-01 DIAGNOSIS — D509 Iron deficiency anemia, unspecified: Secondary | ICD-10-CM | POA: Diagnosis not present

## 2015-10-01 DIAGNOSIS — D631 Anemia in chronic kidney disease: Secondary | ICD-10-CM | POA: Diagnosis not present

## 2015-10-01 DIAGNOSIS — N186 End stage renal disease: Secondary | ICD-10-CM | POA: Diagnosis not present

## 2015-10-01 DIAGNOSIS — E1129 Type 2 diabetes mellitus with other diabetic kidney complication: Secondary | ICD-10-CM | POA: Diagnosis not present

## 2015-10-03 DIAGNOSIS — E1129 Type 2 diabetes mellitus with other diabetic kidney complication: Secondary | ICD-10-CM | POA: Diagnosis not present

## 2015-10-03 DIAGNOSIS — N186 End stage renal disease: Secondary | ICD-10-CM | POA: Diagnosis not present

## 2015-10-03 DIAGNOSIS — N2581 Secondary hyperparathyroidism of renal origin: Secondary | ICD-10-CM | POA: Diagnosis not present

## 2015-10-03 DIAGNOSIS — D631 Anemia in chronic kidney disease: Secondary | ICD-10-CM | POA: Diagnosis not present

## 2015-10-03 DIAGNOSIS — D509 Iron deficiency anemia, unspecified: Secondary | ICD-10-CM | POA: Diagnosis not present

## 2015-10-05 ENCOUNTER — Telehealth: Payer: Self-pay | Admitting: Nutrition

## 2015-10-05 DIAGNOSIS — D631 Anemia in chronic kidney disease: Secondary | ICD-10-CM | POA: Diagnosis not present

## 2015-10-05 DIAGNOSIS — D509 Iron deficiency anemia, unspecified: Secondary | ICD-10-CM | POA: Diagnosis not present

## 2015-10-05 DIAGNOSIS — N2581 Secondary hyperparathyroidism of renal origin: Secondary | ICD-10-CM | POA: Diagnosis not present

## 2015-10-05 DIAGNOSIS — E1129 Type 2 diabetes mellitus with other diabetic kidney complication: Secondary | ICD-10-CM | POA: Diagnosis not present

## 2015-10-05 DIAGNOSIS — N186 End stage renal disease: Secondary | ICD-10-CM | POA: Diagnosis not present

## 2015-10-05 NOTE — Telephone Encounter (Signed)
14 day CGM start today and again on 12/28, because it fell off.  Sheet was given to record insulin doses and food intake

## 2015-10-07 DIAGNOSIS — N2581 Secondary hyperparathyroidism of renal origin: Secondary | ICD-10-CM | POA: Diagnosis not present

## 2015-10-07 DIAGNOSIS — E1129 Type 2 diabetes mellitus with other diabetic kidney complication: Secondary | ICD-10-CM | POA: Diagnosis not present

## 2015-10-07 DIAGNOSIS — N186 End stage renal disease: Secondary | ICD-10-CM | POA: Diagnosis not present

## 2015-10-07 DIAGNOSIS — D509 Iron deficiency anemia, unspecified: Secondary | ICD-10-CM | POA: Diagnosis not present

## 2015-10-07 DIAGNOSIS — D631 Anemia in chronic kidney disease: Secondary | ICD-10-CM | POA: Diagnosis not present

## 2015-10-10 DIAGNOSIS — N186 End stage renal disease: Secondary | ICD-10-CM | POA: Diagnosis not present

## 2015-10-10 DIAGNOSIS — D509 Iron deficiency anemia, unspecified: Secondary | ICD-10-CM | POA: Diagnosis not present

## 2015-10-10 DIAGNOSIS — D631 Anemia in chronic kidney disease: Secondary | ICD-10-CM | POA: Diagnosis not present

## 2015-10-10 DIAGNOSIS — N2581 Secondary hyperparathyroidism of renal origin: Secondary | ICD-10-CM | POA: Diagnosis not present

## 2015-10-10 DIAGNOSIS — E1129 Type 2 diabetes mellitus with other diabetic kidney complication: Secondary | ICD-10-CM | POA: Diagnosis not present

## 2015-10-12 ENCOUNTER — Encounter: Payer: Medicare Other | Admitting: Nutrition

## 2015-10-12 ENCOUNTER — Ambulatory Visit: Payer: Medicare Other | Admitting: Endocrinology

## 2015-10-12 DIAGNOSIS — N186 End stage renal disease: Secondary | ICD-10-CM | POA: Diagnosis not present

## 2015-10-12 DIAGNOSIS — N2581 Secondary hyperparathyroidism of renal origin: Secondary | ICD-10-CM | POA: Diagnosis not present

## 2015-10-12 DIAGNOSIS — D631 Anemia in chronic kidney disease: Secondary | ICD-10-CM | POA: Diagnosis not present

## 2015-10-12 DIAGNOSIS — E1129 Type 2 diabetes mellitus with other diabetic kidney complication: Secondary | ICD-10-CM | POA: Diagnosis not present

## 2015-10-12 DIAGNOSIS — D509 Iron deficiency anemia, unspecified: Secondary | ICD-10-CM | POA: Diagnosis not present

## 2015-10-14 ENCOUNTER — Telehealth: Payer: Self-pay

## 2015-10-14 DIAGNOSIS — D631 Anemia in chronic kidney disease: Secondary | ICD-10-CM | POA: Diagnosis not present

## 2015-10-14 DIAGNOSIS — D509 Iron deficiency anemia, unspecified: Secondary | ICD-10-CM | POA: Diagnosis not present

## 2015-10-14 DIAGNOSIS — N186 End stage renal disease: Secondary | ICD-10-CM | POA: Diagnosis not present

## 2015-10-14 DIAGNOSIS — N2581 Secondary hyperparathyroidism of renal origin: Secondary | ICD-10-CM | POA: Diagnosis not present

## 2015-10-14 DIAGNOSIS — E1129 Type 2 diabetes mellitus with other diabetic kidney complication: Secondary | ICD-10-CM | POA: Diagnosis not present

## 2015-10-14 MED ORDER — ATORVASTATIN CALCIUM 40 MG PO TABS: 40.0000 mg | ORAL_TABLET | Freq: Every day | ORAL | Status: DC

## 2015-10-14 NOTE — Telephone Encounter (Signed)
Patients wife notified

## 2015-10-14 NOTE — Telephone Encounter (Signed)
Change dose to Lipitor generic 40 mg daily. Ok to call in

## 2015-10-14 NOTE — Telephone Encounter (Signed)
Patient stopped by the office and states that he was taking atorvastatin 40mg  (1/2 tab daily) which was recently increased to 1 tab daily. Patient states that his prescription through mail order from the [place] will not be here until 10/27/15. He wants to know if you would be willing to send him a short supply to [facility] as he is out. Please advise- #[phone] would be needed.

## 2015-10-17 DIAGNOSIS — D631 Anemia in chronic kidney disease: Secondary | ICD-10-CM | POA: Diagnosis not present

## 2015-10-17 DIAGNOSIS — E1129 Type 2 diabetes mellitus with other diabetic kidney complication: Secondary | ICD-10-CM | POA: Diagnosis not present

## 2015-10-17 DIAGNOSIS — N186 End stage renal disease: Secondary | ICD-10-CM | POA: Diagnosis not present

## 2015-10-17 DIAGNOSIS — D509 Iron deficiency anemia, unspecified: Secondary | ICD-10-CM | POA: Diagnosis not present

## 2015-10-17 DIAGNOSIS — N2581 Secondary hyperparathyroidism of renal origin: Secondary | ICD-10-CM | POA: Diagnosis not present

## 2015-10-19 DIAGNOSIS — N186 End stage renal disease: Secondary | ICD-10-CM | POA: Diagnosis not present

## 2015-10-19 DIAGNOSIS — E1129 Type 2 diabetes mellitus with other diabetic kidney complication: Secondary | ICD-10-CM | POA: Diagnosis not present

## 2015-10-19 DIAGNOSIS — D631 Anemia in chronic kidney disease: Secondary | ICD-10-CM | POA: Diagnosis not present

## 2015-10-19 DIAGNOSIS — N2581 Secondary hyperparathyroidism of renal origin: Secondary | ICD-10-CM | POA: Diagnosis not present

## 2015-10-19 DIAGNOSIS — D509 Iron deficiency anemia, unspecified: Secondary | ICD-10-CM | POA: Diagnosis not present

## 2015-10-21 DIAGNOSIS — N2581 Secondary hyperparathyroidism of renal origin: Secondary | ICD-10-CM | POA: Diagnosis not present

## 2015-10-21 DIAGNOSIS — D509 Iron deficiency anemia, unspecified: Secondary | ICD-10-CM | POA: Diagnosis not present

## 2015-10-21 DIAGNOSIS — N186 End stage renal disease: Secondary | ICD-10-CM | POA: Diagnosis not present

## 2015-10-21 DIAGNOSIS — D631 Anemia in chronic kidney disease: Secondary | ICD-10-CM | POA: Diagnosis not present

## 2015-10-21 DIAGNOSIS — E1129 Type 2 diabetes mellitus with other diabetic kidney complication: Secondary | ICD-10-CM | POA: Diagnosis not present

## 2015-10-24 DIAGNOSIS — D509 Iron deficiency anemia, unspecified: Secondary | ICD-10-CM | POA: Diagnosis not present

## 2015-10-24 DIAGNOSIS — E1129 Type 2 diabetes mellitus with other diabetic kidney complication: Secondary | ICD-10-CM | POA: Diagnosis not present

## 2015-10-24 DIAGNOSIS — N186 End stage renal disease: Secondary | ICD-10-CM | POA: Diagnosis not present

## 2015-10-24 DIAGNOSIS — N2581 Secondary hyperparathyroidism of renal origin: Secondary | ICD-10-CM | POA: Diagnosis not present

## 2015-10-24 DIAGNOSIS — D631 Anemia in chronic kidney disease: Secondary | ICD-10-CM | POA: Diagnosis not present

## 2015-10-25 DIAGNOSIS — N186 End stage renal disease: Secondary | ICD-10-CM | POA: Diagnosis not present

## 2015-10-25 DIAGNOSIS — Z992 Dependence on renal dialysis: Secondary | ICD-10-CM | POA: Diagnosis not present

## 2015-10-25 DIAGNOSIS — E1122 Type 2 diabetes mellitus with diabetic chronic kidney disease: Secondary | ICD-10-CM | POA: Diagnosis not present

## 2015-10-26 DIAGNOSIS — D509 Iron deficiency anemia, unspecified: Secondary | ICD-10-CM | POA: Diagnosis not present

## 2015-10-26 DIAGNOSIS — N2581 Secondary hyperparathyroidism of renal origin: Secondary | ICD-10-CM | POA: Diagnosis not present

## 2015-10-26 DIAGNOSIS — E1129 Type 2 diabetes mellitus with other diabetic kidney complication: Secondary | ICD-10-CM | POA: Diagnosis not present

## 2015-10-26 DIAGNOSIS — N186 End stage renal disease: Secondary | ICD-10-CM | POA: Diagnosis not present

## 2015-10-26 DIAGNOSIS — D631 Anemia in chronic kidney disease: Secondary | ICD-10-CM | POA: Diagnosis not present

## 2015-10-27 ENCOUNTER — Ambulatory Visit: Payer: Medicare Other | Admitting: Endocrinology

## 2015-10-28 ENCOUNTER — Telehealth: Payer: Self-pay | Admitting: Endocrinology

## 2015-10-28 DIAGNOSIS — N186 End stage renal disease: Secondary | ICD-10-CM | POA: Diagnosis not present

## 2015-10-28 DIAGNOSIS — D509 Iron deficiency anemia, unspecified: Secondary | ICD-10-CM | POA: Diagnosis not present

## 2015-10-28 DIAGNOSIS — D631 Anemia in chronic kidney disease: Secondary | ICD-10-CM | POA: Diagnosis not present

## 2015-10-28 DIAGNOSIS — N2581 Secondary hyperparathyroidism of renal origin: Secondary | ICD-10-CM | POA: Diagnosis not present

## 2015-10-28 DIAGNOSIS — E1129 Type 2 diabetes mellitus with other diabetic kidney complication: Secondary | ICD-10-CM | POA: Diagnosis not present

## 2015-10-28 NOTE — Telephone Encounter (Signed)
Reschedule appointment as soon as possible

## 2015-10-28 NOTE — Telephone Encounter (Signed)
Patient no showed today's appt. Please advise on how to follow up. °A. No follow up necessary. °B. Follow up urgent. Contact patient immediately. °C. Follow up necessary. Contact patient and schedule visit in ___ days. °D. Follow up advised. Contact patient and schedule visit in ____weeks. ° °

## 2015-10-31 DIAGNOSIS — D631 Anemia in chronic kidney disease: Secondary | ICD-10-CM | POA: Diagnosis not present

## 2015-10-31 DIAGNOSIS — N2581 Secondary hyperparathyroidism of renal origin: Secondary | ICD-10-CM | POA: Diagnosis not present

## 2015-10-31 DIAGNOSIS — E1129 Type 2 diabetes mellitus with other diabetic kidney complication: Secondary | ICD-10-CM | POA: Diagnosis not present

## 2015-10-31 DIAGNOSIS — N186 End stage renal disease: Secondary | ICD-10-CM | POA: Diagnosis not present

## 2015-10-31 DIAGNOSIS — D509 Iron deficiency anemia, unspecified: Secondary | ICD-10-CM | POA: Diagnosis not present

## 2015-11-01 ENCOUNTER — Encounter: Payer: Self-pay | Admitting: *Deleted

## 2015-11-01 NOTE — Telephone Encounter (Signed)
Letter mailed

## 2015-11-02 DIAGNOSIS — D631 Anemia in chronic kidney disease: Secondary | ICD-10-CM | POA: Diagnosis not present

## 2015-11-02 DIAGNOSIS — E1129 Type 2 diabetes mellitus with other diabetic kidney complication: Secondary | ICD-10-CM | POA: Diagnosis not present

## 2015-11-02 DIAGNOSIS — D509 Iron deficiency anemia, unspecified: Secondary | ICD-10-CM | POA: Diagnosis not present

## 2015-11-02 DIAGNOSIS — N2581 Secondary hyperparathyroidism of renal origin: Secondary | ICD-10-CM | POA: Diagnosis not present

## 2015-11-02 DIAGNOSIS — N186 End stage renal disease: Secondary | ICD-10-CM | POA: Diagnosis not present

## 2015-11-04 DIAGNOSIS — D509 Iron deficiency anemia, unspecified: Secondary | ICD-10-CM | POA: Diagnosis not present

## 2015-11-04 DIAGNOSIS — E1129 Type 2 diabetes mellitus with other diabetic kidney complication: Secondary | ICD-10-CM | POA: Diagnosis not present

## 2015-11-04 DIAGNOSIS — N186 End stage renal disease: Secondary | ICD-10-CM | POA: Diagnosis not present

## 2015-11-04 DIAGNOSIS — N2581 Secondary hyperparathyroidism of renal origin: Secondary | ICD-10-CM | POA: Diagnosis not present

## 2015-11-04 DIAGNOSIS — D631 Anemia in chronic kidney disease: Secondary | ICD-10-CM | POA: Diagnosis not present

## 2015-11-07 DIAGNOSIS — N2581 Secondary hyperparathyroidism of renal origin: Secondary | ICD-10-CM | POA: Diagnosis not present

## 2015-11-07 DIAGNOSIS — N186 End stage renal disease: Secondary | ICD-10-CM | POA: Diagnosis not present

## 2015-11-07 DIAGNOSIS — E1129 Type 2 diabetes mellitus with other diabetic kidney complication: Secondary | ICD-10-CM | POA: Diagnosis not present

## 2015-11-07 DIAGNOSIS — D509 Iron deficiency anemia, unspecified: Secondary | ICD-10-CM | POA: Diagnosis not present

## 2015-11-07 DIAGNOSIS — D631 Anemia in chronic kidney disease: Secondary | ICD-10-CM | POA: Diagnosis not present

## 2015-11-09 DIAGNOSIS — D631 Anemia in chronic kidney disease: Secondary | ICD-10-CM | POA: Diagnosis not present

## 2015-11-09 DIAGNOSIS — N186 End stage renal disease: Secondary | ICD-10-CM | POA: Diagnosis not present

## 2015-11-09 DIAGNOSIS — D509 Iron deficiency anemia, unspecified: Secondary | ICD-10-CM | POA: Diagnosis not present

## 2015-11-09 DIAGNOSIS — N2581 Secondary hyperparathyroidism of renal origin: Secondary | ICD-10-CM | POA: Diagnosis not present

## 2015-11-09 DIAGNOSIS — E1129 Type 2 diabetes mellitus with other diabetic kidney complication: Secondary | ICD-10-CM | POA: Diagnosis not present

## 2015-11-11 DIAGNOSIS — D509 Iron deficiency anemia, unspecified: Secondary | ICD-10-CM | POA: Diagnosis not present

## 2015-11-11 DIAGNOSIS — N2581 Secondary hyperparathyroidism of renal origin: Secondary | ICD-10-CM | POA: Diagnosis not present

## 2015-11-11 DIAGNOSIS — E1129 Type 2 diabetes mellitus with other diabetic kidney complication: Secondary | ICD-10-CM | POA: Diagnosis not present

## 2015-11-11 DIAGNOSIS — D631 Anemia in chronic kidney disease: Secondary | ICD-10-CM | POA: Diagnosis not present

## 2015-11-11 DIAGNOSIS — N186 End stage renal disease: Secondary | ICD-10-CM | POA: Diagnosis not present

## 2015-11-14 DIAGNOSIS — N2581 Secondary hyperparathyroidism of renal origin: Secondary | ICD-10-CM | POA: Diagnosis not present

## 2015-11-14 DIAGNOSIS — D631 Anemia in chronic kidney disease: Secondary | ICD-10-CM | POA: Diagnosis not present

## 2015-11-14 DIAGNOSIS — E1129 Type 2 diabetes mellitus with other diabetic kidney complication: Secondary | ICD-10-CM | POA: Diagnosis not present

## 2015-11-14 DIAGNOSIS — D509 Iron deficiency anemia, unspecified: Secondary | ICD-10-CM | POA: Diagnosis not present

## 2015-11-14 DIAGNOSIS — N186 End stage renal disease: Secondary | ICD-10-CM | POA: Diagnosis not present

## 2015-11-16 DIAGNOSIS — N2581 Secondary hyperparathyroidism of renal origin: Secondary | ICD-10-CM | POA: Diagnosis not present

## 2015-11-16 DIAGNOSIS — E1129 Type 2 diabetes mellitus with other diabetic kidney complication: Secondary | ICD-10-CM | POA: Diagnosis not present

## 2015-11-16 DIAGNOSIS — D509 Iron deficiency anemia, unspecified: Secondary | ICD-10-CM | POA: Diagnosis not present

## 2015-11-16 DIAGNOSIS — D631 Anemia in chronic kidney disease: Secondary | ICD-10-CM | POA: Diagnosis not present

## 2015-11-16 DIAGNOSIS — N186 End stage renal disease: Secondary | ICD-10-CM | POA: Diagnosis not present

## 2015-11-18 DIAGNOSIS — E1129 Type 2 diabetes mellitus with other diabetic kidney complication: Secondary | ICD-10-CM | POA: Diagnosis not present

## 2015-11-18 DIAGNOSIS — N186 End stage renal disease: Secondary | ICD-10-CM | POA: Diagnosis not present

## 2015-11-18 DIAGNOSIS — N2581 Secondary hyperparathyroidism of renal origin: Secondary | ICD-10-CM | POA: Diagnosis not present

## 2015-11-18 DIAGNOSIS — D631 Anemia in chronic kidney disease: Secondary | ICD-10-CM | POA: Diagnosis not present

## 2015-11-18 DIAGNOSIS — D509 Iron deficiency anemia, unspecified: Secondary | ICD-10-CM | POA: Diagnosis not present

## 2015-11-21 DIAGNOSIS — D631 Anemia in chronic kidney disease: Secondary | ICD-10-CM | POA: Diagnosis not present

## 2015-11-21 DIAGNOSIS — N2581 Secondary hyperparathyroidism of renal origin: Secondary | ICD-10-CM | POA: Diagnosis not present

## 2015-11-21 DIAGNOSIS — E1129 Type 2 diabetes mellitus with other diabetic kidney complication: Secondary | ICD-10-CM | POA: Diagnosis not present

## 2015-11-21 DIAGNOSIS — N186 End stage renal disease: Secondary | ICD-10-CM | POA: Diagnosis not present

## 2015-11-21 DIAGNOSIS — D509 Iron deficiency anemia, unspecified: Secondary | ICD-10-CM | POA: Diagnosis not present

## 2015-11-22 ENCOUNTER — Ambulatory Visit: Payer: Medicare Other | Admitting: Neurology

## 2015-11-22 ENCOUNTER — Encounter: Payer: Self-pay | Admitting: Neurology

## 2015-11-22 ENCOUNTER — Telehealth: Payer: Self-pay | Admitting: *Deleted

## 2015-11-22 DIAGNOSIS — N186 End stage renal disease: Secondary | ICD-10-CM | POA: Diagnosis not present

## 2015-11-22 DIAGNOSIS — E1122 Type 2 diabetes mellitus with diabetic chronic kidney disease: Secondary | ICD-10-CM | POA: Diagnosis not present

## 2015-11-22 DIAGNOSIS — Z992 Dependence on renal dialysis: Secondary | ICD-10-CM | POA: Diagnosis not present

## 2015-11-22 NOTE — Telephone Encounter (Signed)
no showed f/u 

## 2015-11-23 DIAGNOSIS — E1129 Type 2 diabetes mellitus with other diabetic kidney complication: Secondary | ICD-10-CM | POA: Diagnosis not present

## 2015-11-23 DIAGNOSIS — N2581 Secondary hyperparathyroidism of renal origin: Secondary | ICD-10-CM | POA: Diagnosis not present

## 2015-11-23 DIAGNOSIS — N186 End stage renal disease: Secondary | ICD-10-CM | POA: Diagnosis not present

## 2015-11-23 DIAGNOSIS — D509 Iron deficiency anemia, unspecified: Secondary | ICD-10-CM | POA: Diagnosis not present

## 2015-11-25 DIAGNOSIS — N2581 Secondary hyperparathyroidism of renal origin: Secondary | ICD-10-CM | POA: Diagnosis not present

## 2015-11-25 DIAGNOSIS — D509 Iron deficiency anemia, unspecified: Secondary | ICD-10-CM | POA: Diagnosis not present

## 2015-11-25 DIAGNOSIS — E1129 Type 2 diabetes mellitus with other diabetic kidney complication: Secondary | ICD-10-CM | POA: Diagnosis not present

## 2015-11-25 DIAGNOSIS — N186 End stage renal disease: Secondary | ICD-10-CM | POA: Diagnosis not present

## 2015-11-28 ENCOUNTER — Telehealth: Payer: Self-pay | Admitting: Neurology

## 2015-11-28 DIAGNOSIS — D509 Iron deficiency anemia, unspecified: Secondary | ICD-10-CM | POA: Diagnosis not present

## 2015-11-28 DIAGNOSIS — N186 End stage renal disease: Secondary | ICD-10-CM | POA: Diagnosis not present

## 2015-11-28 DIAGNOSIS — N2581 Secondary hyperparathyroidism of renal origin: Secondary | ICD-10-CM | POA: Diagnosis not present

## 2015-11-28 DIAGNOSIS — E1129 Type 2 diabetes mellitus with other diabetic kidney complication: Secondary | ICD-10-CM | POA: Diagnosis not present

## 2015-11-28 NOTE — Telephone Encounter (Signed)
[name] called again and states that a second appt his mother passed away and the third appt pt missed he simply forgot. [name] is understanding that she will need to be proactive with his care and appts. She is requesting a call back to discuss [phone]

## 2015-11-28 NOTE — Telephone Encounter (Signed)
[name] called sts there was a death in the family 06/25/15 and pt forgot to call. She realizes that she will need to be proactive in his care with appointments. Message relayed to wife not to rely on [facility] to call and remind pt of appt, she would need to be responsible for keeping up pt's appointments. She is asking if the dismissal is the final decision or could an exception be made.

## 2015-11-28 NOTE — Telephone Encounter (Signed)
Dr Jaynee Eagles- what would you like to do?

## 2015-11-29 NOTE — Telephone Encounter (Signed)
That's fine but if he misses another appointment we really can't see him again. Please let angie know, thanks

## 2015-11-29 NOTE — Telephone Encounter (Signed)
LVM for wife. Per Dr Jaynee Eagles, pt has one more chance. He can schedule f/u w/ Dr Jaynee Eagles. Advised them to call [phone] to schedule.

## 2015-11-30 DIAGNOSIS — N186 End stage renal disease: Secondary | ICD-10-CM | POA: Diagnosis not present

## 2015-11-30 DIAGNOSIS — E1129 Type 2 diabetes mellitus with other diabetic kidney complication: Secondary | ICD-10-CM | POA: Diagnosis not present

## 2015-11-30 DIAGNOSIS — N2581 Secondary hyperparathyroidism of renal origin: Secondary | ICD-10-CM | POA: Diagnosis not present

## 2015-11-30 DIAGNOSIS — D509 Iron deficiency anemia, unspecified: Secondary | ICD-10-CM | POA: Diagnosis not present

## 2015-12-02 DIAGNOSIS — D509 Iron deficiency anemia, unspecified: Secondary | ICD-10-CM | POA: Diagnosis not present

## 2015-12-02 DIAGNOSIS — N186 End stage renal disease: Secondary | ICD-10-CM | POA: Diagnosis not present

## 2015-12-02 DIAGNOSIS — N2581 Secondary hyperparathyroidism of renal origin: Secondary | ICD-10-CM | POA: Diagnosis not present

## 2015-12-02 DIAGNOSIS — E1129 Type 2 diabetes mellitus with other diabetic kidney complication: Secondary | ICD-10-CM | POA: Diagnosis not present

## 2015-12-05 ENCOUNTER — Other Ambulatory Visit: Payer: Medicare Other

## 2015-12-05 DIAGNOSIS — N186 End stage renal disease: Secondary | ICD-10-CM | POA: Diagnosis not present

## 2015-12-05 DIAGNOSIS — D509 Iron deficiency anemia, unspecified: Secondary | ICD-10-CM | POA: Diagnosis not present

## 2015-12-05 DIAGNOSIS — N2581 Secondary hyperparathyroidism of renal origin: Secondary | ICD-10-CM | POA: Diagnosis not present

## 2015-12-05 DIAGNOSIS — E1129 Type 2 diabetes mellitus with other diabetic kidney complication: Secondary | ICD-10-CM | POA: Diagnosis not present

## 2015-12-05 NOTE — Telephone Encounter (Signed)
One more chance for f/u. Was dismissed but Dr Jaynee Eagles giving one more chance. Check in 845am Spoke to [place], wife. ELK

## 2015-12-06 ENCOUNTER — Other Ambulatory Visit (INDEPENDENT_AMBULATORY_CARE_PROVIDER_SITE_OTHER): Payer: Medicare Other

## 2015-12-06 DIAGNOSIS — Z794 Long term (current) use of insulin: Secondary | ICD-10-CM

## 2015-12-06 DIAGNOSIS — E1165 Type 2 diabetes mellitus with hyperglycemia: Secondary | ICD-10-CM

## 2015-12-06 LAB — GLUCOSE, RANDOM: Glucose, Bld: 146 mg/dL — ABNORMAL HIGH (ref 70–99)

## 2015-12-07 ENCOUNTER — Ambulatory Visit (INDEPENDENT_AMBULATORY_CARE_PROVIDER_SITE_OTHER): Payer: Medicare Other | Admitting: Endocrinology

## 2015-12-07 ENCOUNTER — Other Ambulatory Visit: Payer: Self-pay | Admitting: *Deleted

## 2015-12-07 ENCOUNTER — Encounter: Payer: Self-pay | Admitting: Endocrinology

## 2015-12-07 VITALS — BP 128/82 | HR 52 | Temp 97.9°F | Resp 14 | Ht 68.0 in | Wt 205.0 lb

## 2015-12-07 DIAGNOSIS — D509 Iron deficiency anemia, unspecified: Secondary | ICD-10-CM | POA: Diagnosis not present

## 2015-12-07 DIAGNOSIS — N2581 Secondary hyperparathyroidism of renal origin: Secondary | ICD-10-CM | POA: Diagnosis not present

## 2015-12-07 DIAGNOSIS — E1165 Type 2 diabetes mellitus with hyperglycemia: Secondary | ICD-10-CM | POA: Diagnosis not present

## 2015-12-07 DIAGNOSIS — Z794 Long term (current) use of insulin: Secondary | ICD-10-CM | POA: Diagnosis not present

## 2015-12-07 DIAGNOSIS — E1129 Type 2 diabetes mellitus with other diabetic kidney complication: Secondary | ICD-10-CM | POA: Diagnosis not present

## 2015-12-07 DIAGNOSIS — E118 Type 2 diabetes mellitus with unspecified complications: Secondary | ICD-10-CM

## 2015-12-07 DIAGNOSIS — N186 End stage renal disease: Secondary | ICD-10-CM | POA: Diagnosis not present

## 2015-12-07 LAB — POCT GLYCOSYLATED HEMOGLOBIN (HGB A1C): Hemoglobin A1C: 6.4

## 2015-12-07 LAB — FRUCTOSAMINE: Fructosamine: 326 umol/L — ABNORMAL HIGH (ref 0–285)

## 2015-12-07 MED ORDER — INSULIN ASPART 100 UNIT/ML ~~LOC~~ SOLN: 10.0000 [IU] | Freq: Three times a day (TID) | SUBCUTANEOUS | Status: DC

## 2015-12-07 NOTE — Progress Notes (Signed)
Patient ID: [name], male   DOB: Aug 03, 1941, 75 y.o.   MRN: [id number]           Reason for Appointment:  Follow-up for Type 2 Diabetes  Referring physician: [place]  History of Present Illness:          Date of diagnosis of type 2 diabetes mellitus :        Background history:  He has had long-standing diabetes probably treated with metformin initially and subsequently with sulfonylurea drugs At some point he was also given Actos in addition to his glipizide which he is still taking Records of his control are only available for the last 4 years Over the last few years his A1c has been generally in the 6-7% range His A1c was 6.2 in May 2016 and apparently was starting to get low normal blood sugars at times He was admitted to the hospital for fluid overload and renal failure and at that time his Actos was stopped Subsequently his blood sugars have been markedly increased  Recent history:   INSULIN regimen is described as: Lantus 13 units in am; R insulin 10 units ac bid with syringe    He has not been seen back in follow-up since his visit in 12/16 At that time he had a continuous glucose monitoring recording done  Surprisingly his A1c is better today even though he has not changed his management  He has been back on his Actos  which had previously helped his control      Current blood sugar patterns and problems identified:  He did not bring his monitor for download again and is only checking blood sugars in the mornings   he had entered some blood sugars on his continuous glucose monitoring diarrhea but these did not correlate with actual readings   his continuous glucose monitoring data indicates significant hypoglycemia during the night including some nights when it would be persistently low     Also  Did have variably high readings in the afternoons and evenings especially for the days around New Year's Eve    he is getting his medications through the [facility] to use syringes for his insulin   He thinks he had a low sugar at least once or twice at about 2 AM current blood sugar may have been in the 40s  He does not have any edema from taking Actos, 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  When he is at dialysis he is snacking on peanut butter crackers and rice crispies  Oral hypoglycemic drugs the patient is taking are: Actos 30 mg daily in a.m.     Side effects from medications have been: None  Compliance with the medical regimen: Good  Hypoglycemia:  As above  CONTINUOUS GLUCOSE MONITORING RECORD INTERPRETATION    Dates of Recording: 09/21/15 through 10/05/15  Sensor  summary:  Average blood glucose for the period of recording is 135 Quality of recording is excellent for all the days Blood sugars are generally trending downwards during the night and gradually on an average trending up in the afternoons and evenings with the HIGHEST blood sugar averaging 168 between 6-8 PM and lowest readings of 90 average between 6 AM-8 AM Blood sugars are within target range usually less than 60% of the time, mostly ranging between 30-50% of the time.  Blood sugars are above target range anywhere between 21-66%  of the time and low between 0-69% of the time       Glycemic patterns:  Hyperglycemic episodes have occurred been consistently in the late morning/early afternoon mostly within the first week of the recording and occasional high episodes in the evenings after 6 PM but not consistently   Hypoglycemic episodes occurred on 6 of the nights of the recording with most significantly low sugars on 1/8 in 1/11 lasting most of the night.  Only on 1/88 had hypoglycemia in the evening between 4-10 PM      Overnight periods:   blood sugars are usually trending downwards after midnight with hypoglycemia occurring periodically as above      Preprandial periods:   blood  sugars are generally normal to low before the first meal in normal to high before his evening meal around 6 PM      Postprandial periods:  difficult to identify as he does have a diary of his food eaten with occasional high readings after his first meal late morning especially on 1/1 and 1/3 but also occasionally in the evenings after 6 PM      Hypoglycemia:  as discussed in glycemic patterns.  This is occurring mostly overnight and rarely around suppertime or bedtime        Glucose monitoring:  done 2  times a day         Glucometer:  Accu-Chek Blood Glucose readings by recall  Mean values apply above for all meters except median for One Touch  PRE-MEAL Fasting Lunch Dinner Bedtime Overall  Glucose range: 95-170      Mean/median:          Self-care: The diet that the patient has been following is: tries to limit sweets, portions. eating out only about once a week at a steak house        Typical meal intake: Breakfast is  eggs and shake, usually light lunch, mostly snacks or half sandwich; supper 8 pm              Dietician visit, most recent: ?               Exercise: none, not able to do much   Dialysis 12 noon   Weight history:  Wt Readings from Last 3 Encounters:  12/07/15 205 lb (92.987 kg)  09/13/15 211 lb (95.709 kg)  09/12/15 214 lb (97.07 kg)    Glycemic control:   Lab Results  Component Value Date   HGBA1C 6.4 12/07/2015   HGBA1C 8.9* 09/02/2015   HGBA1C 9.0* 05/05/2015   Lab Results  Component Value Date   MICROALBUR 354.8* 02/14/2015   LDLCALC 37 09/02/2015   CREATININE 3.57* 02/24/2015         Medication List       This list is accurate as of: 12/07/15 12:32 PM.  Always use your most recent med list.               acetaminophen 500 MG tablet  Commonly known as:  TYLENOL  Take 500 mg by mouth every 6 (six) hours as needed.     allopurinol 100 MG tablet  Commonly known as:  ZYLOPRIM  Take 100 mg by mouth daily.     aspirin 325 MG tablet   Take 650 mg by mouth daily.     atorvastatin 40 MG tablet  Commonly known as:  LIPITOR  Take 1 tablet (40 mg total) by mouth daily.     calcitRIOL 0.25 MCG capsule  Commonly known as:  ROCALTROL  Take 0.25-0.5 mcg by mouth See admin instructions. Takes 2 capsules ever day     carvedilol 12.5 MG tablet  Commonly known as:  COREG  Take 12.5 mg by mouth 2 (two) times daily with a meal.     clobetasol 0.05 % external solution  Commonly known as:  TEMOVATE  apply to affected area ON THE SCALP UP TO 2 TIMES A DAY AS NEEDED...  (REFER TO PRESCRIPTION NOTES).     donepezil 10 MG tablet  Commonly known as:  ARICEPT  Take 1 tablet (10 mg total) by mouth at bedtime.     DSS 100 MG Caps  Take 100 mg by mouth 2 (two) times daily.     FLUOCINOLONE ACETONIDE SCALP 0.01 % Oil  apply to affected area ON SCALP EVERY NIGHT AT BEDTIME, WASH UPON AWAKENING     fluticasone 0.05 % cream  Commonly known as:  CUTIVATE  apply to affected area ON FACE UP TO 2 TIMES A DAY AS NEEDED     fluticasone 50 MCG/ACT nasal spray  Commonly known as:  FLONASE  Place 1 spray into both nostrils daily.     glipiZIDE 5 MG tablet  Commonly known as:  GLUCOTROL  Take 1 tablet (5 mg total) by mouth daily before breakfast.     insulin aspart 100 UNIT/ML injection  Commonly known as:  novoLOG  Inject 10 Units into the skin 3 (three) times daily before meals.     insulin glargine 100 UNIT/ML injection  Commonly known as:  LANTUS  Inject 0.1 mLs (10 Units total) into the skin daily.     insulin regular 100 units/mL injection  Commonly known as:  HUMULIN R  3 units Humulin R subcutaneous before bedtime if Accu-Cheks are running over 300     ketoconazole 2 % shampoo  Commonly known as:  NIZORAL  Apply 1 application topically 2 (two) times a week.     levothyroxine 50 MCG tablet  Commonly known as:  SYNTHROID, LEVOTHROID  Take 1 tablet (50 mcg total) by mouth daily.     lidocaine-prilocaine cream  Commonly  known as:  EMLA     loratadine 10 MG tablet  Commonly known as:  CLARITIN  Take 10 mg by mouth daily as needed for allergies.     metroNIDAZOLE 0.75 % gel  Commonly known as:  METROGEL  APPLY TO AFFECTED AREA ON SKIN  ON FACE TWICE DAILY     mupirocin ointment 2 %  Commonly known as:  BACTROBAN  Use as directed to affected areas     Naftifine HCl 2 % Crea  Commonly known as:  NAFTIN  Apply to feet daily for fungal infection     NIFEdipine 60 MG 24 hr tablet  Commonly known as:  PROCARDIA-XL/ADALAT CC  Take 60 mg by mouth daily.     pioglitazone 30 MG tablet  Commonly known as:  ACTOS  Take 1 tablet (30 mg total) by mouth daily.     RENVELA 800 MG tablet  Generic drug:  sevelamer carbonate  Take 800 mg by mouth 3 (three) times daily with meals. Takes 3  Tablets with each meal and 1 tablet with snack     simvastatin 20 MG tablet  Commonly known as:  ZOCOR  Take 10 mg by mouth at bedtime.     triamcinolone ointment 0.1 %  Commonly known as:  KENALOG  Apply 1 application topically 2 (two) times daily. Use  between thighs        Allergies:  Allergies  Allergen Reactions  . Penicillins Rash    Past Medical History  Diagnosis Date  . Hypertension   . Thyroid disease   . Renal insufficiency   . Hyperlipidemia   . ED (erectile dysfunction)   . Allergy   . 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 PVD (peripheral vascular disease) ([place])     has plastic aorta  . Arthritis   . Diverticulitis   . Seasonal allergies   . Hypothyroidism   . GERD (gastroesophageal reflux disease)     pepto   . Gout   . Diabetes mellitus     Type 2  . Pneumonia   . Anemia     Past Surgical History  Procedure Laterality Date  . Aortobifemoral bypass    . Breast surgery      left - granulomatous mastitis  . Sbo with lysis adhesions    . Eye surgery Bilateral     cataracts  . Colonoscopy    . Av fistula placement Left 12/01/2013    Procedure: ARTERIOVENOUS (AV) FISTULA CREATION- LEFT  BRACHIOCEPHALIC;  Surgeon: Angelia Mould, MD;  Location: [facility] [facility];  Service: Vascular;  Laterality: Left;  . Revison of arteriovenous fistula Left 02/09/2014    Procedure: REVISON OF LEFT ARTERIOVENOUS FISTULA - RESECTION OF RENDUNDANT VEIN;  Surgeon: Angelia Mould, MD;  Location: [facility];  Service: Vascular;  Laterality: Left;  . Bascilic vein transposition Right 07/27/2014    Procedure: BASCILIC VEIN TRANSPOSITION;  Surgeon: Angelia Mould, MD;  Location: [facility];  Service: Vascular;  Laterality: Right;  . Shuntogram Left 04/19/2014    Procedure: FISTULOGRAM;  Surgeon: Angelia Mould, MD;  Location: [facility] CATH LAB;  Service: Cardiovascular;  Laterality: Left;  . Unilateral upper extremeity angiogram N/A 07/12/2014    Procedure: UNILATERAL UPPER Anselmo Rod;  Surgeon: Angelia Mould, MD;  Location: [facility] CATH LAB;  Service: Cardiovascular;  Laterality: N/A;    Family History  Problem Relation Age of Onset  . Aneurysm Mother   . Heart disease Father   . Stroke Father   . Hypertension Father   . Diabetes Father   . Dementia Neg Hx     Social History:  reports that he quit smoking about 21 years ago. He has never used smokeless tobacco. He reports that he does not drink alcohol or use illicit drugs.    Review of Systems    Lipid history: He has been treated with 20 mg Zocor, not clear what baseline levels were    Lab Results  Component Value Date   CHOL 85* 09/02/2015   HDL 33* 09/02/2015   LDLCALC 37 09/02/2015   TRIG 74 09/02/2015   CHOLHDL 2.6 09/02/2015            Eyes: no history of blurred vision.  Most recent eye exam was 2015  Hypertension: was started on treatment in the 1980s    ?  Hypothyroidism: His TSH is recently normal  Lab Results  Component Value Date   TSH 1.285 09/02/2015   TSH 1.526 04/04/2015   TSH 2.946 02/18/2015     LABS:  Office Visit on 12/07/2015  Component Date Value Ref Range Status  . Hemoglobin A1C  12/07/2015 6.4   Final  Lab on 12/06/2015  Component Date Value Ref Range Status  . Fructosamine 12/06/2015 326* 0 - 285 umol/L Final   Comment: Published reference interval for apparently healthy subjects  between age 69 and 40 is 40 - 56 umol/L and in a poorly controlled diabetic population is 228 - 563 umol/L with a mean of 396 umol/L.   Marland Kitchen Glucose, Bld 12/06/2015 146* 70 - 99 mg/dL Final    Physical Examination:  BP 128/82 mmHg  Pulse 52  Temp(Src) 97.9 F (36.6 C)  Resp 14  Ht 5\' 8"  (1.727 m)  Wt 205 lb (92.987 kg)  BMI 31.18 kg/m2  SpO2 97%      ASSESSMENT:  Diabetes type 2, uncontrolled with A1c of 8.9% See history of present illness for detailed discussion of his current management, blood sugar patterns and problems identified He has had overall lower blood sugars but difficult to assess as he has not brought his monitor and is checking blood sugars only fasting His continuous glucose monitoring done about 2 months ago did show tendency to nocturnal hypoglycemia but daytime hyperglycemia on an average  Most likely he tends to have late night hypoglycemia related to his taking regular insulin His Lantus is being dosed in the morning and he has not had low sugars during the day usually He is not consistent with his diet   PLAN:   He will check his blood sugars consistently at least twice a day at various times including after meals and bring his monitor for download  Most likely needs to adjust his MEALTIME insulin based on his meal size and carbohydrate intake and this was discussed in detail  He will switch from regular insulin to NovoLog and reduce the doses  Discussed using lower doses at least at breakfast because of his having less carbohydrate recently in the morning  From previous recordings does not appear to have consistently high readings in the early afternoon during dialysis and will not need mealtime coverage for his snacks  May also consider  reducing Lantus if fasting blood sugars tend to be high  Continue Actos  Encouraged him to be active and walk more  Consider consultation with dietitian   Patient Instructions  Reduce R insulin to 8 units in am and take it 30 min before meal  Adjust insulin at supper between 6-8 units based on meal size  Switch Regular insulin to Novolog and take 5-10 min before meals  Check blood sugars on waking up 3-4  times a week  Also check blood sugars about 2 hours after a meal and do this after different meals by rotation  Recommended blood sugar levels on waking up is 90-130 and about 2 hours after meal is 130-160  Please bring your blood sugar monitor to each visit, thank you   Counseling time on subjects discussed above is over 50% of today's 25 minute  visit   Arlett Goold 12/07/2015, 12:32 PM   Note: This office note was prepared with Dragon voice recognition system technology. Any transcriptional errors that result from this process are unintentional.

## 2015-12-07 NOTE — Patient Instructions (Signed)
Reduce R insulin to 8 units in am and take it 30 min before meal  Adjust insulin at supper between 6-8 units based on meal size  Switch Regular insulin to Novolog and take 5-10 min before meals  Check blood sugars on waking up 3-4  times a week  Also check blood sugars about 2 hours after a meal and do this after different meals by rotation  Recommended blood sugar levels on waking up is 90-130 and about 2 hours after meal is 130-160  Please bring your blood sugar monitor to each visit, thank you

## 2015-12-09 DIAGNOSIS — N2581 Secondary hyperparathyroidism of renal origin: Secondary | ICD-10-CM | POA: Diagnosis not present

## 2015-12-09 DIAGNOSIS — N186 End stage renal disease: Secondary | ICD-10-CM | POA: Diagnosis not present

## 2015-12-09 DIAGNOSIS — D509 Iron deficiency anemia, unspecified: Secondary | ICD-10-CM | POA: Diagnosis not present

## 2015-12-09 DIAGNOSIS — E1129 Type 2 diabetes mellitus with other diabetic kidney complication: Secondary | ICD-10-CM | POA: Diagnosis not present

## 2015-12-12 DIAGNOSIS — D509 Iron deficiency anemia, unspecified: Secondary | ICD-10-CM | POA: Diagnosis not present

## 2015-12-12 DIAGNOSIS — N2581 Secondary hyperparathyroidism of renal origin: Secondary | ICD-10-CM | POA: Diagnosis not present

## 2015-12-12 DIAGNOSIS — N186 End stage renal disease: Secondary | ICD-10-CM | POA: Diagnosis not present

## 2015-12-12 DIAGNOSIS — E1129 Type 2 diabetes mellitus with other diabetic kidney complication: Secondary | ICD-10-CM | POA: Diagnosis not present

## 2015-12-14 DIAGNOSIS — D509 Iron deficiency anemia, unspecified: Secondary | ICD-10-CM | POA: Diagnosis not present

## 2015-12-14 DIAGNOSIS — N186 End stage renal disease: Secondary | ICD-10-CM | POA: Diagnosis not present

## 2015-12-14 DIAGNOSIS — E1129 Type 2 diabetes mellitus with other diabetic kidney complication: Secondary | ICD-10-CM | POA: Diagnosis not present

## 2015-12-14 DIAGNOSIS — N2581 Secondary hyperparathyroidism of renal origin: Secondary | ICD-10-CM | POA: Diagnosis not present

## 2015-12-15 DIAGNOSIS — L218 Other seborrheic dermatitis: Secondary | ICD-10-CM | POA: Diagnosis not present

## 2015-12-16 DIAGNOSIS — E1129 Type 2 diabetes mellitus with other diabetic kidney complication: Secondary | ICD-10-CM | POA: Diagnosis not present

## 2015-12-16 DIAGNOSIS — N2581 Secondary hyperparathyroidism of renal origin: Secondary | ICD-10-CM | POA: Diagnosis not present

## 2015-12-16 DIAGNOSIS — D509 Iron deficiency anemia, unspecified: Secondary | ICD-10-CM | POA: Diagnosis not present

## 2015-12-16 DIAGNOSIS — N186 End stage renal disease: Secondary | ICD-10-CM | POA: Diagnosis not present

## 2015-12-19 DIAGNOSIS — E1129 Type 2 diabetes mellitus with other diabetic kidney complication: Secondary | ICD-10-CM | POA: Diagnosis not present

## 2015-12-19 DIAGNOSIS — D509 Iron deficiency anemia, unspecified: Secondary | ICD-10-CM | POA: Diagnosis not present

## 2015-12-19 DIAGNOSIS — N2581 Secondary hyperparathyroidism of renal origin: Secondary | ICD-10-CM | POA: Diagnosis not present

## 2015-12-19 DIAGNOSIS — N186 End stage renal disease: Secondary | ICD-10-CM | POA: Diagnosis not present

## 2015-12-21 DIAGNOSIS — N186 End stage renal disease: Secondary | ICD-10-CM | POA: Diagnosis not present

## 2015-12-21 DIAGNOSIS — D509 Iron deficiency anemia, unspecified: Secondary | ICD-10-CM | POA: Diagnosis not present

## 2015-12-21 DIAGNOSIS — E1129 Type 2 diabetes mellitus with other diabetic kidney complication: Secondary | ICD-10-CM | POA: Diagnosis not present

## 2015-12-21 DIAGNOSIS — N2581 Secondary hyperparathyroidism of renal origin: Secondary | ICD-10-CM | POA: Diagnosis not present

## 2015-12-23 DIAGNOSIS — Z992 Dependence on renal dialysis: Secondary | ICD-10-CM | POA: Diagnosis not present

## 2015-12-23 DIAGNOSIS — N2581 Secondary hyperparathyroidism of renal origin: Secondary | ICD-10-CM | POA: Diagnosis not present

## 2015-12-23 DIAGNOSIS — D509 Iron deficiency anemia, unspecified: Secondary | ICD-10-CM | POA: Diagnosis not present

## 2015-12-23 DIAGNOSIS — E1122 Type 2 diabetes mellitus with diabetic chronic kidney disease: Secondary | ICD-10-CM | POA: Diagnosis not present

## 2015-12-23 DIAGNOSIS — E1129 Type 2 diabetes mellitus with other diabetic kidney complication: Secondary | ICD-10-CM | POA: Diagnosis not present

## 2015-12-23 DIAGNOSIS — N186 End stage renal disease: Secondary | ICD-10-CM | POA: Diagnosis not present

## 2015-12-26 DIAGNOSIS — D631 Anemia in chronic kidney disease: Secondary | ICD-10-CM | POA: Diagnosis not present

## 2015-12-26 DIAGNOSIS — N2581 Secondary hyperparathyroidism of renal origin: Secondary | ICD-10-CM | POA: Diagnosis not present

## 2015-12-26 DIAGNOSIS — D509 Iron deficiency anemia, unspecified: Secondary | ICD-10-CM | POA: Diagnosis not present

## 2015-12-26 DIAGNOSIS — N186 End stage renal disease: Secondary | ICD-10-CM | POA: Diagnosis not present

## 2015-12-26 DIAGNOSIS — E1129 Type 2 diabetes mellitus with other diabetic kidney complication: Secondary | ICD-10-CM | POA: Diagnosis not present

## 2015-12-28 DIAGNOSIS — D631 Anemia in chronic kidney disease: Secondary | ICD-10-CM | POA: Diagnosis not present

## 2015-12-28 DIAGNOSIS — D509 Iron deficiency anemia, unspecified: Secondary | ICD-10-CM | POA: Diagnosis not present

## 2015-12-28 DIAGNOSIS — E1129 Type 2 diabetes mellitus with other diabetic kidney complication: Secondary | ICD-10-CM | POA: Diagnosis not present

## 2015-12-28 DIAGNOSIS — N2581 Secondary hyperparathyroidism of renal origin: Secondary | ICD-10-CM | POA: Diagnosis not present

## 2015-12-28 DIAGNOSIS — N186 End stage renal disease: Secondary | ICD-10-CM | POA: Diagnosis not present

## 2015-12-30 DIAGNOSIS — D631 Anemia in chronic kidney disease: Secondary | ICD-10-CM | POA: Diagnosis not present

## 2015-12-30 DIAGNOSIS — N2581 Secondary hyperparathyroidism of renal origin: Secondary | ICD-10-CM | POA: Diagnosis not present

## 2015-12-30 DIAGNOSIS — D509 Iron deficiency anemia, unspecified: Secondary | ICD-10-CM | POA: Diagnosis not present

## 2015-12-30 DIAGNOSIS — N186 End stage renal disease: Secondary | ICD-10-CM | POA: Diagnosis not present

## 2015-12-30 DIAGNOSIS — E1129 Type 2 diabetes mellitus with other diabetic kidney complication: Secondary | ICD-10-CM | POA: Diagnosis not present

## 2016-01-03 ENCOUNTER — Ambulatory Visit: Payer: Medicare Other | Admitting: Internal Medicine

## 2016-01-03 DIAGNOSIS — N2581 Secondary hyperparathyroidism of renal origin: Secondary | ICD-10-CM | POA: Diagnosis not present

## 2016-01-03 DIAGNOSIS — D509 Iron deficiency anemia, unspecified: Secondary | ICD-10-CM | POA: Diagnosis not present

## 2016-01-03 DIAGNOSIS — N186 End stage renal disease: Secondary | ICD-10-CM | POA: Diagnosis not present

## 2016-01-03 DIAGNOSIS — E1129 Type 2 diabetes mellitus with other diabetic kidney complication: Secondary | ICD-10-CM | POA: Diagnosis not present

## 2016-01-03 DIAGNOSIS — D631 Anemia in chronic kidney disease: Secondary | ICD-10-CM | POA: Diagnosis not present

## 2016-01-04 DIAGNOSIS — D631 Anemia in chronic kidney disease: Secondary | ICD-10-CM | POA: Diagnosis not present

## 2016-01-04 DIAGNOSIS — D509 Iron deficiency anemia, unspecified: Secondary | ICD-10-CM | POA: Diagnosis not present

## 2016-01-04 DIAGNOSIS — N186 End stage renal disease: Secondary | ICD-10-CM | POA: Diagnosis not present

## 2016-01-04 DIAGNOSIS — E1129 Type 2 diabetes mellitus with other diabetic kidney complication: Secondary | ICD-10-CM | POA: Diagnosis not present

## 2016-01-04 DIAGNOSIS — N2581 Secondary hyperparathyroidism of renal origin: Secondary | ICD-10-CM | POA: Diagnosis not present

## 2016-01-05 ENCOUNTER — Encounter: Payer: Self-pay | Admitting: Endocrinology

## 2016-01-05 ENCOUNTER — Ambulatory Visit: Payer: Medicare Other | Admitting: Endocrinology

## 2016-01-05 DIAGNOSIS — Z0289 Encounter for other administrative examinations: Secondary | ICD-10-CM

## 2016-01-06 DIAGNOSIS — N2581 Secondary hyperparathyroidism of renal origin: Secondary | ICD-10-CM | POA: Diagnosis not present

## 2016-01-06 DIAGNOSIS — D631 Anemia in chronic kidney disease: Secondary | ICD-10-CM | POA: Diagnosis not present

## 2016-01-06 DIAGNOSIS — E1129 Type 2 diabetes mellitus with other diabetic kidney complication: Secondary | ICD-10-CM | POA: Diagnosis not present

## 2016-01-06 DIAGNOSIS — D509 Iron deficiency anemia, unspecified: Secondary | ICD-10-CM | POA: Diagnosis not present

## 2016-01-06 DIAGNOSIS — N186 End stage renal disease: Secondary | ICD-10-CM | POA: Diagnosis not present

## 2016-01-09 DIAGNOSIS — E1129 Type 2 diabetes mellitus with other diabetic kidney complication: Secondary | ICD-10-CM | POA: Diagnosis not present

## 2016-01-09 DIAGNOSIS — N186 End stage renal disease: Secondary | ICD-10-CM | POA: Diagnosis not present

## 2016-01-09 DIAGNOSIS — N2581 Secondary hyperparathyroidism of renal origin: Secondary | ICD-10-CM | POA: Diagnosis not present

## 2016-01-09 DIAGNOSIS — D631 Anemia in chronic kidney disease: Secondary | ICD-10-CM | POA: Diagnosis not present

## 2016-01-09 DIAGNOSIS — D509 Iron deficiency anemia, unspecified: Secondary | ICD-10-CM | POA: Diagnosis not present

## 2016-01-10 ENCOUNTER — Encounter: Payer: Self-pay | Admitting: Internal Medicine

## 2016-01-10 ENCOUNTER — Other Ambulatory Visit: Payer: Self-pay | Admitting: Neurology

## 2016-01-10 ENCOUNTER — Ambulatory Visit (INDEPENDENT_AMBULATORY_CARE_PROVIDER_SITE_OTHER): Payer: Medicare Other | Admitting: Internal Medicine

## 2016-01-10 ENCOUNTER — Telehealth: Payer: Self-pay | Admitting: Endocrinology

## 2016-01-10 VITALS — BP 164/70 | HR 49 | Temp 98.1°F | Resp 18 | Wt 201.0 lb

## 2016-01-10 DIAGNOSIS — Z79899 Other long term (current) drug therapy: Secondary | ICD-10-CM

## 2016-01-10 DIAGNOSIS — N186 End stage renal disease: Secondary | ICD-10-CM | POA: Diagnosis not present

## 2016-01-10 DIAGNOSIS — E039 Hypothyroidism, unspecified: Secondary | ICD-10-CM

## 2016-01-10 DIAGNOSIS — R413 Other amnesia: Secondary | ICD-10-CM | POA: Diagnosis not present

## 2016-01-10 DIAGNOSIS — Z992 Dependence on renal dialysis: Secondary | ICD-10-CM | POA: Diagnosis not present

## 2016-01-10 DIAGNOSIS — E118 Type 2 diabetes mellitus with unspecified complications: Secondary | ICD-10-CM

## 2016-01-10 DIAGNOSIS — IMO0001 Reserved for inherently not codable concepts without codable children: Secondary | ICD-10-CM

## 2016-01-10 DIAGNOSIS — R030 Elevated blood-pressure reading, without diagnosis of hypertension: Secondary | ICD-10-CM | POA: Diagnosis not present

## 2016-01-10 DIAGNOSIS — Z794 Long term (current) use of insulin: Secondary | ICD-10-CM | POA: Diagnosis not present

## 2016-01-10 DIAGNOSIS — Z8679 Personal history of other diseases of the circulatory system: Secondary | ICD-10-CM

## 2016-01-10 DIAGNOSIS — E785 Hyperlipidemia, unspecified: Secondary | ICD-10-CM

## 2016-01-10 NOTE — Telephone Encounter (Signed)
Patient dismissed from [facility] by Elayne Snare MD , effective January 05, 2016. Dismissal letter sent out by certified / registered mail. DAJ  Received signed domestic return receipt verifying delivery of certified letter on January 13, 2016. Article number [id number] [place]

## 2016-01-10 NOTE — Progress Notes (Signed)
   Subjective:    Patient ID: [name], male    DOB: [birth year], 75 y.o.   MRN: [id number]  HPI  75 year old Black Male for follow up of multiple medical issues. Hx of CKD on dialysis 3 times a week, followed by Dr. Lorrene Reid. Trying to get  kidney transplant through [facility] and is in the process of being evaluated. Is having issues keeping appointments here despite calls in advance, he is forgetting. It seems Dr. Ronnie Derby practice dismissed him. Says he is checking accuchecks once or twice daily and highest has been 147 with occasional 200 with dietary indiscretion.  Has lost 10 pounds since December.Continues to be followed by neurologist for memory loss issues. Also having trouble keeping those appointments. Says he feels better than he has a long time. Seems more alert and less forgetful. No complaint of pain.    Review of Systems  Respiratory: Negative.   Cardiovascular: Negative.        Objective:   Physical Exam  Constitutional: He is oriented to person, place, and time. He appears well-developed and well-nourished. No distress.  Eyes: Right eye exhibits no discharge. Left eye exhibits no discharge.  Neck: Neck supple. No JVD present.  Lymphadenopathy:    He has no cervical adenopathy.  Neurological: He is alert and oriented to person, place, and time. No cranial nerve deficit. Coordination normal.  Skin: He is not diaphoretic.  Psychiatric: He has a normal mood and affect. His behavior is normal. Judgment and thought content normal.  Vitals reviewed.  Knows day of week, president, year, April.        Assessment & Plan:  Chronic kidney disease-now on dialysis 3 times weekly [facility] essential hypertension-stable  Hyperlipidemia  Controlled type 2 diabetes mellitus-Dr. Dwyane Dee has done done an excellent job helping get diabetes under control. Hemoglobin A1c 7.9% and previously was 8.9% 4 months ago  Memory loss-continue be followed by  [name]  Essential hypertension

## 2016-01-10 NOTE — Telephone Encounter (Signed)
Benita with [facility] called to request the Rx donepezil 10 mg be called in or faxed in.  Phone:  [phone].  Fax [phone].

## 2016-01-11 ENCOUNTER — Other Ambulatory Visit: Payer: Self-pay | Admitting: Neurology

## 2016-01-11 ENCOUNTER — Other Ambulatory Visit: Payer: Self-pay | Admitting: Endocrinology

## 2016-01-11 DIAGNOSIS — D509 Iron deficiency anemia, unspecified: Secondary | ICD-10-CM | POA: Diagnosis not present

## 2016-01-11 DIAGNOSIS — D631 Anemia in chronic kidney disease: Secondary | ICD-10-CM | POA: Diagnosis not present

## 2016-01-11 DIAGNOSIS — N186 End stage renal disease: Secondary | ICD-10-CM | POA: Diagnosis not present

## 2016-01-11 DIAGNOSIS — N2581 Secondary hyperparathyroidism of renal origin: Secondary | ICD-10-CM | POA: Diagnosis not present

## 2016-01-11 DIAGNOSIS — E1129 Type 2 diabetes mellitus with other diabetic kidney complication: Secondary | ICD-10-CM | POA: Diagnosis not present

## 2016-01-11 LAB — LIPID PANEL
Cholesterol: 78 mg/dL — ABNORMAL LOW (ref 125–200)
HDL: 31 mg/dL — ABNORMAL LOW (ref >=40)
LDL Cholesterol: 33 mg/dL (ref <130)
Total CHOL/HDL Ratio: 2.5 Ratio (ref <=5.0)
Triglycerides: 68 mg/dL (ref <150)
VLDL: 14 mg/dL (ref <30)

## 2016-01-11 LAB — COMPLETE METABOLIC PANEL WITH GFR
ALT: 12 U/L (ref 9–46)
AST: 23 U/L (ref 10–35)
Albumin: 3.6 g/dL (ref 3.6–5.1)
Alkaline Phosphatase: 136 U/L — ABNORMAL HIGH (ref 40–115)
BUN: 20 mg/dL (ref 7–25)
CO2: 33 mmol/L — ABNORMAL HIGH (ref 20–31)
Calcium: 8.6 mg/dL (ref 8.6–10.3)
Chloride: 96 mmol/L — ABNORMAL LOW (ref 98–110)
Creat: 5.03 mg/dL — ABNORMAL HIGH (ref 0.70–1.18)
GFR, Est African American: 12 mL/min — ABNORMAL LOW (ref >=60)
GFR, Est Non African American: 10 mL/min — ABNORMAL LOW (ref >=60)
Glucose, Bld: 118 mg/dL — ABNORMAL HIGH (ref 65–99)
Potassium: 4 mmol/L (ref 3.5–5.3)
Sodium: 139 mmol/L (ref 135–146)
Total Bilirubin: 0.5 mg/dL (ref 0.2–1.2)
Total Protein: 5.8 g/dL — ABNORMAL LOW (ref 6.1–8.1)

## 2016-01-11 LAB — HEMOGLOBIN A1C
Hgb A1c MFr Bld: 7.2 % — ABNORMAL HIGH (ref <5.7)
Mean Plasma Glucose: 160 mg/dL

## 2016-01-11 LAB — TSH: TSH: 1.8 mIU/L (ref 0.40–4.50)

## 2016-01-11 LAB — PHOSPHORUS: Phosphorus: 4.2 mg/dL (ref 2.1–4.3)

## 2016-01-12 ENCOUNTER — Ambulatory Visit (INDEPENDENT_AMBULATORY_CARE_PROVIDER_SITE_OTHER): Payer: Medicare Other | Admitting: Neurology

## 2016-01-12 ENCOUNTER — Encounter: Payer: Self-pay | Admitting: Neurology

## 2016-01-12 VITALS — BP 136/51 | HR 53 | Ht 68.0 in | Wt 203.4 lb

## 2016-01-12 DIAGNOSIS — G3184 Mild cognitive impairment, so stated: Secondary | ICD-10-CM | POA: Diagnosis not present

## 2016-01-12 MED ORDER — DONEPEZIL HCL 10 MG PO TABS: 10.0000 mg | ORAL_TABLET | Freq: Every day | ORAL | Status: DC

## 2016-01-12 NOTE — Progress Notes (Signed)
[id number]    Provider:  Dr Jaynee Eagles Referring Provider: Elby Showers, MD Primary Care Physician:  Elby Showers, MD  CC: Memory problems  Interval history: Patient no-showed for multiple (3) appointments and was discharged from our practice. I spoke to him and agreed to see him again wit the understanding that if he misses another appointment he will need to find another neurology group. He has been going through a lot recently, trying to get disability and his health has declined. He says he has too many appointments. He goes to Dr. Renold Genta and also goes to the [place]. He can follow with Dr. Renold Genta, doesn't need to follow with [place] unless he needs [place] if he feels he has too many doctors. He takes 650mg  of asa a day, not sure why, advised him this is not indicated for stroke prevention. 81mg  daily. 650mg  daily may increase his risk of bleeding. He is on Aricept 10mg  daily and we will refill this today. Last hgba1c 7.2. LDL 33. TSH wnl. B12 in July 2016 was 1068 (Hx of b12 deficiency). Memory is stable, in fact he is feeling a little better.   HPI: [name] is a 75 y.o. male here as a referral from Dr. Renold Genta for memory problems. PMHx peripheral vascular disease, hypertension, hypothyroidism, hyperparathyroidism, diabetes(uncontrolled since starting dialysis), end-stage renal disease on dialysis, hyperlipidemia, b-12 deficiency on injections (B12 237 at that time). Recently he has noticed he can't catch onto things as quickly as he used to. The latter part of may her noticed it, when he was having more medical issues and he was gaining fluid and had a bout of pneumonia. His memory got really bad before he went into the hospital and was started on dialysis. Things are better since then. He is more alert and catching things better. Wife is here and provides much information, she says he can't keep up with his cell phone and keys and he loses them. He goes into the kitchen to make a  snack and he forgets to close the cabinets. He is forgetting people's names, appointments. He used to always be able to monitor medications and he doesn't remember what he is taking his meds for now. He used to be very sharp with this meds but there is a big change. He misplaces checks. He does pay the bills. No accidents with the car or at home. No delusions or hallucinations. No changes in personality. He is more anxious because of the medical problems. Wife endorses depression. He still gets out and likes to do things, but the side effects of his medical conditions are hard for him to handle. He is frustrated. No loss of consciousness, no episodes of confusion, no staring spells. No other focal neurologic deficits. He worries a lot. Endorses snoring, excessive daytime fatigue and witnessed apneic events.   Reviewed notes, labs and imaging from outside physicians, which showed:hgba1c 9.0, b12 1068, tsh wnl, folate wnl, rpr nr,    IMPRESSION: personally reviewed images 1. No acute intracranial abnormality. 2. Cerebellar chronic small vessel ischemia, Mild to moderate. Minimal to mild for age nonspecific cerebral white matter signal changes, also most commonly due to small vessel disease.  Patient follows with Dr. Tedra Senegal. Note from July 2016, Dr. Renold Genta notes that patient discuss memory loss issues, but he is not clear sharp as he was previously, he some issues keeping track of his medications, more forgetful.She did an extensive workup for dementia which included MRI of the brain  and lab work as detailed above. Patient's B12 was noted to be low in May 2015 and he was started on B12 injections. Recent B12 was improved.  Review of Systems: Patient complains of symptoms per HPI as well as the following symptoms: easy bruising, snoring, cramps, rash, impotence, memory loss, anxiety, snoring. Pertinent negatives per HPI. All others negative.    Social History   Social History  . Marital  Status: Married    Spouse Name: [name] Mood  . Number of Children: 1  . Years of Education: 16   Occupational History  . Retired    Social History Main Topics  . Smoking status: Former Smoker    Quit date: 09/24/1994  . Smokeless tobacco: Never Used  . Alcohol Use: No     Comment: Quit Oct. 1977 ("somewhat heavy")  . Drug Use: No  . Sexual Activity: Not on file   Other Topics Concern  . Not on file   Social History Narrative   Lives at home with wife.   Caffeine use: Drinks no soda or tea.    Drinks 1 cup coffee/week       Family History  Problem Relation Age of Onset  . Aneurysm Mother   . Heart disease Father   . Stroke Father   . Hypertension Father   . Diabetes Father   . Dementia Neg Hx     Past Medical History  Diagnosis Date  . Hypertension   . Thyroid disease   . Renal insufficiency   . Hyperlipidemia   . ED (erectile dysfunction)   . Allergy   . 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 PVD (peripheral vascular disease) ([place])     has plastic aorta  . Arthritis   . Diverticulitis   . Seasonal allergies   . Hypothyroidism   . GERD (gastroesophageal reflux disease)     pepto   . Gout   . Diabetes mellitus     Type 2  . Pneumonia   . Anemia     Past Surgical History  Procedure Laterality Date  . Aortobifemoral bypass    . Breast surgery      left - granulomatous mastitis  . Sbo with lysis adhesions    . Eye surgery Bilateral     cataracts  . Colonoscopy    . Av fistula placement Left 12/01/2013    Procedure: ARTERIOVENOUS (AV) FISTULA CREATION- LEFT BRACHIOCEPHALIC;  Surgeon: Angelia Mould, MD;  Location: [place];  Service: Vascular;  Laterality: Left;  . Revison of arteriovenous fistula Left 02/09/2014    Procedure: REVISON OF LEFT ARTERIOVENOUS FISTULA - RESECTION OF RENDUNDANT VEIN;  Surgeon: Angelia Mould, MD;  Location: [place];  Service: Vascular;  Laterality: Left;  . Bascilic vein transposition Right 07/27/2014    Procedure: BASCILIC  VEIN TRANSPOSITION;  Surgeon: Angelia Mould, MD;  Location: [place];  Service: Vascular;  Laterality: Right;  . Shuntogram Left 04/19/2014    Procedure: FISTULOGRAM;  Surgeon: Angelia Mould, MD;  Location: [facility] CATH LAB;  Service: Cardiovascular;  Laterality: Left;  . Unilateral upper extremeity angiogram N/A 07/12/2014    Procedure: UNILATERAL UPPER Anselmo Rod;  Surgeon: Angelia Mould, MD;  Location[facility] C[facility] LAB;  Service: Cardiovascular;  Laterality: N/A;    Current Outpatient Prescriptions  Medication Sig Dispense Refill  . acetaminophen (TYLENOL) 500 MG tablet Take 500 mg by mouth every 6 (six) hours as needed.    Marland Kitchen allopurinol (ZYLOPRIM) 100 MG tablet Take 100 mg by  mouth daily.      Marland Kitchen aspirin 325 MG tablet Take 650 mg by mouth daily.     Marland Kitchen atorvastatin (LIPITOR) 40 MG tablet Take 1 tablet (40 mg total) by mouth daily. 30 tablet 0  . carvedilol (COREG) 12.5 MG tablet Take 12.5 mg by mouth 2 (two) times daily with a meal.  0  . clobetasol (TEMOVATE) 0.05 % external solution apply to affected area ON THE SCALP UP TO 2 TIMES A DAY AS NEEDED...  (REFER TO PRESCRIPTION NOTES).  0  . donepezil (ARICEPT) 10 MG tablet Take 1 tablet (10 mg total) by mouth at bedtime. 30 tablet 3  . FLUOCINOLONE ACETONIDE SCALP 0.01 % OIL apply to affected area ON SCALP EVERY NIGHT AT BEDTIME, WASH UPON AWAKENING  0  . fluticasone (CUTIVATE) 0.05 % cream apply to affected area ON FACE UP TO 2 TIMES A DAY AS NEEDED  0  . fluticasone (FLONASE) 50 MCG/ACT nasal spray Place 1 spray into both nostrils daily. (Patient taking differently: Place 1 spray into both nostrils daily as needed for allergies. ) 16 g 2  . insulin aspart (NOVOLOG) 100 UNIT/ML injection Inject 10 Units into the skin 3 (three) times daily before meals. (Patient taking differently: Inject 8 Units into the skin 2 (two) times daily. ) 20 mL 2  . insulin glargine (LANTUS) 100 UNIT/ML injection Inject 0.1 mLs (10 Units total)  into the skin daily. 10 mL 1  . ketoconazole (NIZORAL) 2 % shampoo Apply 1 application topically 2 (two) times a week. 120 mL 0  . levothyroxine (SYNTHROID, LEVOTHROID) 50 MCG tablet Take 1 tablet (50 mcg total) by mouth daily. 90 tablet 1  . lidocaine-prilocaine (EMLA) cream   1  . loratadine (CLARITIN) 10 MG tablet Take 10 mg by mouth daily as needed for allergies.     . metroNIDAZOLE (METROGEL) 0.75 % gel APPLY TO AFFECTED AREA ON SKIN  ON FACE TWICE DAILY  0  . mupirocin ointment (BACTROBAN) 2 % Use as directed to affected areas 22 g prn  . Naftifine HCl (NAFTIN) 2 % CREA Apply to feet daily for fungal infection 60 g prn  . pioglitazone (ACTOS) 30 MG tablet Take 1 tablet (30 mg total) by mouth daily. 30 tablet 3  . RENVELA 800 MG tablet Take 800 mg by mouth 3 (three) times daily with meals. Takes 3  Tablets with each meal and 1 tablet with snack  0  . SENSIPAR 30 MG tablet   1  . simvastatin (ZOCOR) 20 MG tablet Take 10 mg by mouth at bedtime.     . triamcinolone ointment (KENALOG) 0.1 % Apply 1 application topically 2 (two) times daily. Use between thighs     No current facility-administered medications for this visit.    Allergies as of 01/12/2016 - Review Complete 01/10/2016  Allergen Reaction Noted  . Penicillins Rash     Vitals: There were no vitals taken for this visit. Last Weight:  Wt Readings from Last 1 Encounters:  01/10/16 201 lb (91.173 kg)   Last Height:   Ht Readings from Last 1 Encounters:  12/07/15 5\' 8"  (1.727 m)       Physical exam: Exam: Gen: NAD, conversant, well nourised, well groomed  CV: RRR, no MRG. No Carotid Bruits. No peripheral edema, warm, nontender Eyes: Conjunctivae clear without exudates or hemorrhage  Neuro: Detailed Neurologic Exam  Speech:  Speech is normal; fluent and spontaneous with normal comprehension.  Cognition: MoCA 23/30 -1 attention, -2  serial 7, -1 language, -1 abstraction, -2 delayed recall.   The patient is oriented to person, place, and time;   recent memory impaired and remote memory intact;   language fluent;   normal attention, concentration, fund of knowledge  Montreal Cognitive Assessment  05/20/2015  Visuospatial/ Executive (0/5) 5  Naming (0/3) 2  Attention: Read list of digits (0/2) 1  Attention: Read list of letters (0/1) 1  Attention: Serial 7 subtraction starting at 100 (0/3) 1  Language: Repeat phrase (0/2) 2  Language : Fluency (0/1) 0  Abstraction (0/2) 1  Delayed Recall (0/5) 3  Orientation (0/6) 6  Total 22  Adjusted Score (based on education) 22   Cranial Nerves:  The pupils are equal, round, and reactive to light. Visual fields are full to finger confrontation. Extraocular movements are intact. Trigeminal sensation is intact and the muscles of mastication are normal. The face is symmetric. The palate elevates in the midline. Hearing intact. Voice is normal. Shoulder shrug is normal. The tongue has normal motion without fasciculations.   Motor Observation:  No asymmetry, no atrophy, and no involuntary movements noted. Tone:  Normal muscle tone.   Posture:  Posture is normal. normal erect   Strength:  Strength is V/V in the upper and lower limbs.    Sensation: intact to LT   Reflex Exam:  DTR's:  Deep tendon reflexes in the upper and lower extremities are symmetrical bilaterally.  Toes:  The toes are downgoing bilaterally.  Clonus:  Clonus is absent.      Assessment/Plan: [name] is a very nice 75 y.o. male here as a referral from Dr. Renold Genta for memory problems. PMHx peripheral vascular disease, hypertension, hypothyroidism, hyperparathyroidism, diabetes(uncontrolled since starting dialysis), end-stage renal disease on dialysis, hyperlipidemia, b-12 deficiency on injections (B12 237 at that time). Neurologic exam is nonfocal. Montreal cognitive assessment 23 out of 30. MRI of the brain showed  nonspecific white matter changes that were mild for age. Discussion with wife and patient that his memory complaints are likely multifactorial. There is probably some mild cognitive impairment that's normal for age but his medical problems are likely contributing significantly to his complaints. I discussed that medical conditions can definitely affect cognitive abilities. Dr. Renold Genta already did a very thorough dementia workup.   - The only thing I would add is a sleep study given patient's complaints of snoring and excessive daytime fatigue. Untreated sleep apnea can cause cognitive impairment, headaches, excessive daytime fatigue as well as increased risk of stroke, hypertension. He saw our sleep doctor but he declined sleep study. I highly encouraged him to go back for sleep test.  -Discussed neurocognitive testing if patient wants it but it will not change management, discussed if he ever wants to do this as it can give [place] much more information about his cognitive status.  - Continue B12 supplementation - He takes 650mg  of ASA daily, not sure why. From our perspective, asa 81mg  for stroke porevention is sufficient. This dose of ASA may increase risk of bleeding. Advised him to discuss with Dr. Renold Genta. He bruises eaily. Advised him to decrease at least to 325mg  daily and talk to Dr. Renold Genta to see if any indication for any other dose.  - follow up in 6 months   Assessment/Plan:    Sarina Ill, MD  [facility] [address] [place], [place] [address]  Phone [phone] Fax [phone]  A total of 30 minutes was spent face-to-face with this patient. Over half  this time was spent on counseling patient on the mild cognitive impairment diagnosis and different diagnostic and therapeutic options available.

## 2016-01-12 NOTE — Addendum Note (Signed)
Addended by: Sarina Ill B on: 01/12/2016 09:31 AM   Modules accepted: Orders

## 2016-01-12 NOTE — Patient Instructions (Signed)
Remember to drink plenty of fluid, eat healthy meals and do not skip any meals. Try to eat protein with a every meal and eat a healthy snack such as fruit or nuts in between meals. Try to keep a regular sleep-wake schedule and try to exercise daily, particularly in the form of walking, 20-30 minutes a day, if you can.   As far as diagnostic testing: Neurocognitive testing  My clinical assistant and will answer any of your questions and relay your messages to me and also relay most of my messages to you.   Our phone number is [phone]. We also have an after hours call service for urgent matters and there is a physician on-call for urgent questions. For any emergencies you know to call 911 or go to the nearest emergency room

## 2016-01-13 DIAGNOSIS — N186 End stage renal disease: Secondary | ICD-10-CM | POA: Diagnosis not present

## 2016-01-13 DIAGNOSIS — E1129 Type 2 diabetes mellitus with other diabetic kidney complication: Secondary | ICD-10-CM | POA: Diagnosis not present

## 2016-01-13 DIAGNOSIS — N2581 Secondary hyperparathyroidism of renal origin: Secondary | ICD-10-CM | POA: Diagnosis not present

## 2016-01-13 DIAGNOSIS — D631 Anemia in chronic kidney disease: Secondary | ICD-10-CM | POA: Diagnosis not present

## 2016-01-13 DIAGNOSIS — D509 Iron deficiency anemia, unspecified: Secondary | ICD-10-CM | POA: Diagnosis not present

## 2016-01-16 DIAGNOSIS — D509 Iron deficiency anemia, unspecified: Secondary | ICD-10-CM | POA: Diagnosis not present

## 2016-01-16 DIAGNOSIS — D631 Anemia in chronic kidney disease: Secondary | ICD-10-CM | POA: Diagnosis not present

## 2016-01-16 DIAGNOSIS — N2581 Secondary hyperparathyroidism of renal origin: Secondary | ICD-10-CM | POA: Diagnosis not present

## 2016-01-16 DIAGNOSIS — N186 End stage renal disease: Secondary | ICD-10-CM | POA: Diagnosis not present

## 2016-01-16 DIAGNOSIS — E1129 Type 2 diabetes mellitus with other diabetic kidney complication: Secondary | ICD-10-CM | POA: Diagnosis not present

## 2016-01-17 ENCOUNTER — Ambulatory Visit: Payer: Medicare Other | Admitting: Internal Medicine

## 2016-01-18 ENCOUNTER — Telehealth: Payer: Self-pay | Admitting: Internal Medicine

## 2016-01-18 DIAGNOSIS — D509 Iron deficiency anemia, unspecified: Secondary | ICD-10-CM | POA: Diagnosis not present

## 2016-01-18 DIAGNOSIS — N186 End stage renal disease: Secondary | ICD-10-CM | POA: Diagnosis not present

## 2016-01-18 DIAGNOSIS — N2581 Secondary hyperparathyroidism of renal origin: Secondary | ICD-10-CM | POA: Diagnosis not present

## 2016-01-18 DIAGNOSIS — E1129 Type 2 diabetes mellitus with other diabetic kidney complication: Secondary | ICD-10-CM | POA: Diagnosis not present

## 2016-01-18 DIAGNOSIS — D631 Anemia in chronic kidney disease: Secondary | ICD-10-CM | POA: Diagnosis not present

## 2016-01-18 NOTE — Telephone Encounter (Signed)
Thank you Dr. Renold Genta.

## 2016-01-18 NOTE — Telephone Encounter (Signed)
Spoke with wife directly today about patient's multiple missed appointments. She says she will take more responsibility forcing that he gets to these appointments. Explained to her that reminder calls were provided in advance. She says sometimes he inadvertently erases them at home before she gets them. I think she should be the one to get a reminder calls calls on her cell phone reminding her of all of his appointments. They should be sent to her cell phone.

## 2016-01-18 NOTE — Telephone Encounter (Signed)
Agree 

## 2016-01-18 NOTE — Telephone Encounter (Signed)
Patient came to office yesterday saying he had been dismissed by Dr. Ronnie Derby office for missed appointments. It is clear he has missed multiple appointments with multiple providers including myself, neurology and Dr. Dwyane Dee. I did call Dr. Ronnie Derby office today and explained situation that the patient has some memory issues. However, patient's wife needs to take responsibility for seeing that he gets to these appointments. I'm going to have phone conversation with wife about this today.

## 2016-01-20 ENCOUNTER — Other Ambulatory Visit: Payer: Self-pay | Admitting: *Deleted

## 2016-01-20 DIAGNOSIS — D509 Iron deficiency anemia, unspecified: Secondary | ICD-10-CM | POA: Diagnosis not present

## 2016-01-20 DIAGNOSIS — N186 End stage renal disease: Secondary | ICD-10-CM | POA: Diagnosis not present

## 2016-01-20 DIAGNOSIS — N2581 Secondary hyperparathyroidism of renal origin: Secondary | ICD-10-CM | POA: Diagnosis not present

## 2016-01-20 DIAGNOSIS — D631 Anemia in chronic kidney disease: Secondary | ICD-10-CM | POA: Diagnosis not present

## 2016-01-20 DIAGNOSIS — E1129 Type 2 diabetes mellitus with other diabetic kidney complication: Secondary | ICD-10-CM | POA: Diagnosis not present

## 2016-01-20 MED ORDER — INSULIN ASPART 100 UNIT/ML ~~LOC~~ SOLN: 10.0000 [IU] | Freq: Three times a day (TID) | SUBCUTANEOUS | Status: DC

## 2016-01-21 ENCOUNTER — Encounter: Payer: Self-pay | Admitting: Internal Medicine

## 2016-01-21 NOTE — Patient Instructions (Signed)
Patient to continue same medications and return for physical exam early October 2017. His contact information will be changed to his wife's cell phone number because of missed appointments.

## 2016-01-22 DIAGNOSIS — Z992 Dependence on renal dialysis: Secondary | ICD-10-CM | POA: Diagnosis not present

## 2016-01-22 DIAGNOSIS — N186 End stage renal disease: Secondary | ICD-10-CM | POA: Diagnosis not present

## 2016-01-22 DIAGNOSIS — E1122 Type 2 diabetes mellitus with diabetic chronic kidney disease: Secondary | ICD-10-CM | POA: Diagnosis not present

## 2016-01-23 DIAGNOSIS — D631 Anemia in chronic kidney disease: Secondary | ICD-10-CM | POA: Diagnosis not present

## 2016-01-23 DIAGNOSIS — E1129 Type 2 diabetes mellitus with other diabetic kidney complication: Secondary | ICD-10-CM | POA: Diagnosis not present

## 2016-01-23 DIAGNOSIS — N186 End stage renal disease: Secondary | ICD-10-CM | POA: Diagnosis not present

## 2016-01-23 DIAGNOSIS — N2581 Secondary hyperparathyroidism of renal origin: Secondary | ICD-10-CM | POA: Diagnosis not present

## 2016-01-25 DIAGNOSIS — E1129 Type 2 diabetes mellitus with other diabetic kidney complication: Secondary | ICD-10-CM | POA: Diagnosis not present

## 2016-01-25 DIAGNOSIS — N2581 Secondary hyperparathyroidism of renal origin: Secondary | ICD-10-CM | POA: Diagnosis not present

## 2016-01-25 DIAGNOSIS — D631 Anemia in chronic kidney disease: Secondary | ICD-10-CM | POA: Diagnosis not present

## 2016-01-25 DIAGNOSIS — N186 End stage renal disease: Secondary | ICD-10-CM | POA: Diagnosis not present

## 2016-01-27 DIAGNOSIS — E1129 Type 2 diabetes mellitus with other diabetic kidney complication: Secondary | ICD-10-CM | POA: Diagnosis not present

## 2016-01-27 DIAGNOSIS — N2581 Secondary hyperparathyroidism of renal origin: Secondary | ICD-10-CM | POA: Diagnosis not present

## 2016-01-27 DIAGNOSIS — D631 Anemia in chronic kidney disease: Secondary | ICD-10-CM | POA: Diagnosis not present

## 2016-01-27 DIAGNOSIS — N186 End stage renal disease: Secondary | ICD-10-CM | POA: Diagnosis not present

## 2016-01-30 DIAGNOSIS — D631 Anemia in chronic kidney disease: Secondary | ICD-10-CM | POA: Diagnosis not present

## 2016-01-30 DIAGNOSIS — N186 End stage renal disease: Secondary | ICD-10-CM | POA: Diagnosis not present

## 2016-01-30 DIAGNOSIS — N2581 Secondary hyperparathyroidism of renal origin: Secondary | ICD-10-CM | POA: Diagnosis not present

## 2016-01-30 DIAGNOSIS — L218 Other seborrheic dermatitis: Secondary | ICD-10-CM | POA: Diagnosis not present

## 2016-01-30 DIAGNOSIS — E1129 Type 2 diabetes mellitus with other diabetic kidney complication: Secondary | ICD-10-CM | POA: Diagnosis not present

## 2016-01-31 ENCOUNTER — Encounter: Payer: Self-pay | Admitting: Endocrinology

## 2016-01-31 ENCOUNTER — Other Ambulatory Visit: Payer: Self-pay | Admitting: *Deleted

## 2016-01-31 ENCOUNTER — Ambulatory Visit (INDEPENDENT_AMBULATORY_CARE_PROVIDER_SITE_OTHER): Payer: Medicare Other | Admitting: Endocrinology

## 2016-01-31 VITALS — BP 136/55 | HR 60 | Temp 98.0°F | Resp 14 | Ht 68.0 in | Wt 199.2 lb

## 2016-01-31 DIAGNOSIS — E1165 Type 2 diabetes mellitus with hyperglycemia: Secondary | ICD-10-CM

## 2016-01-31 DIAGNOSIS — Z794 Long term (current) use of insulin: Secondary | ICD-10-CM

## 2016-01-31 MED ORDER — GLUCOSE BLOOD VI STRP: ORAL_STRIP | Status: DC

## 2016-01-31 MED ORDER — ONETOUCH DELICA LANCETS 33G MISC
Status: DC
Start: 2016-01-31 — End: 2016-01-31

## 2016-01-31 MED ORDER — FREESTYLE FREEDOM LITE W/DEVICE KIT: PACK | Status: DC

## 2016-01-31 MED ORDER — FREESTYLE LANCETS MISC: Status: DC

## 2016-01-31 NOTE — Patient Instructions (Signed)
Check blood sugars on waking up 3  times a week  Also check blood sugars about 2 hours after a meal and do this after different meals by rotation  Recommended blood sugar levels on waking up is 90-130 and about 2 hours after meal is 130-160  Please bring your blood sugar monitor to each visit, thank you  If sugar >190 after lunch take 4 Novolog midday with snack

## 2016-01-31 NOTE — Progress Notes (Signed)
Patient ID: [name], male   DOB: 09-05-1941, 75 y.o.   MRN: [id number]           Reason for Appointment:  Follow-up for Type 2 Diabetes  Referring physician: [place]  History of Present Illness:          Date of diagnosis of type 2 diabetes mellitus :        Background history:  He has had long-standing diabetes probably treated with metformin initially and subsequently with sulfonylurea drugs At some point he was also given Actos in addition to his glipizide which he is still taking Records of his control are only available for the last 4 years Over the last few years his A1c has been generally in the 6-7% range His A1c was 6.2 in May 2016 and apparently was starting to get low normal blood sugars at times He was admitted to the hospital for fluid overload and renal failure and at that time his Actos was stopped Subsequently his blood sugars have been markedly increased  Recent history:   On his last visit he was switched from regular insulin to NovoLog although he is still having difficulties getting this from his [place] His last A1c is 7.2, previously as high as 8.9  INSULIN regimen is described as: Lantus 10 units in am; 8 units Novolog ac bid with syringe        Current blood sugar patterns and problems identified:  He now is using the precision glucose monitor as he does not think he can get test strips otherwise at the [place] blood sugars are relatively good recently and No hypoglycemia reported with 10 units of Lantus, previously on 13  He previously had some readings after meals but not clear what these are since he does not check after meals including after supper  He thinks he has less hypoglycemia now with using NovoLog instead of regular insulin   Still continues to take Actos without any weight gain   He appears to be more motivated and taking better care of his diabetes now  Oral hypoglycemic drugs the patient is  taking are: Actos 30 mg daily in a.m.     Side effects from medications have been: None  Compliance with the medical regimen: Good  Hypoglycemia: None  Glucose monitoring:  done 0-2  times a day         Glucometer:  Precision  Blood Glucose readings by review of home monitoring: Recently fasting range 109-155 Has only one reading at night of 200, reportedly after eating a large meal   Self-care: The diet that the patient has been following is: tries to limit sweets, portions. eating out only about once a week at a steak house        Typical meal intake: Breakfast is  eggs and protein shake, usually light lunch with mostly snacks like peanut butter crackers or half sandwich; supper 8 pm Dialysis is done at 12 noon                Dietician visit, most recent: ?               Exercise: none, not able to do much    Weight history:  Wt Readings from Last 3 Encounters:  01/31/16 199 lb 3.2 oz (90.357 kg)  01/12/16 203 lb 6.4 oz (92.262 kg)  01/10/16 201 lb (91.173 kg)    Glycemic control:   Lab Results  Component Value Date  HGBA1C 7.2* 01/10/2016   HGBA1C 6.4 12/07/2015   HGBA1C 8.9* 09/02/2015   Lab Results  Component Value Date   MICROALBUR 354.8* 02/14/2015   LDLCALC 33 01/10/2016   CREATININE 5.03* 01/10/2016         Medication List       This list is accurate as of: 01/31/16  9:06 PM.  Always use your most recent med list.               acetaminophen 500 MG tablet  Commonly known as:  TYLENOL  Take 500 mg by mouth every 6 (six) hours as needed.     allopurinol 100 MG tablet  Commonly known as:  ZYLOPRIM  Take 100 mg by mouth daily.     aspirin 325 MG tablet  Take 650 mg by mouth daily.     atorvastatin 40 MG tablet  Commonly known as:  LIPITOR  Take 1 tablet (40 mg total) by mouth daily.     carvedilol 12.5 MG tablet  Commonly known as:  COREG  Take 12.5 mg by mouth 2 (two) times daily with a meal.     clobetasol 0.05 % external solution    Commonly known as:  TEMOVATE  apply to affected area ON THE SCALP UP TO 2 TIMES A DAY AS NEEDED...  (REFER TO PRESCRIPTION NOTES).     donepezil 10 MG tablet  Commonly known as:  ARICEPT  Take 1 tablet (10 mg total) by mouth at bedtime.     FLUOCINOLONE ACETONIDE SCALP 0.01 % Oil  apply to affected area ON SCALP EVERY NIGHT AT BEDTIME, WASH UPON AWAKENING     fluticasone 0.05 % cream  Commonly known as:  CUTIVATE  apply to affected area ON FACE UP TO 2 TIMES A DAY AS NEEDED     fluticasone 50 MCG/ACT nasal spray  Commonly known as:  FLONASE  Place 1 spray into both nostrils daily.     FREESTYLE FREEDOM LITE w/Device Kit  Use to check blood sugar 2 times per day dx code E11.65     freestyle lancets  Use as instructed to check blood sugar 2 times per day dx code E11.65     glucose blood test strip  Commonly known as:  FREESTYLE LITE  Use as instructed to check blood sugar 2 times per day dx code E11.65     insulin aspart 100 UNIT/ML injection  Commonly known as:  novoLOG  Inject 10 Units into the skin 3 (three) times daily before meals.     insulin glargine 100 UNIT/ML injection  Commonly known as:  LANTUS  Inject 0.1 mLs (10 Units total) into the skin daily.     ketoconazole 2 % shampoo  Commonly known as:  NIZORAL  Apply 1 application topically 2 (two) times a week.     levothyroxine 50 MCG tablet  Commonly known as:  SYNTHROID, LEVOTHROID  Take 1 tablet (50 mcg total) by mouth daily.     lidocaine-prilocaine cream  Commonly known as:  EMLA     loratadine 10 MG tablet  Commonly known as:  CLARITIN  Take 10 mg by mouth daily as needed for allergies.     metroNIDAZOLE 0.75 % gel  Commonly known as:  METROGEL  APPLY TO AFFECTED AREA ON SKIN  ON FACE TWICE DAILY     mupirocin ointment 2 %  Commonly known as:  BACTROBAN  Use as directed to affected areas     Naftifine HCl 2 %  Crea  Commonly known as:  NAFTIN  Apply to feet daily for fungal infection      pioglitazone 30 MG tablet  Commonly known as:  ACTOS  Take 1 tablet (30 mg total) by mouth daily.     RENVELA 800 MG tablet  Generic drug:  sevelamer carbonate  Take 800 mg by mouth 3 (three) times daily with meals. Takes 3  Tablets with each meal and 1 tablet with snack     SENSIPAR 30 MG tablet  Generic drug:  cinacalcet     simvastatin 20 MG tablet  Commonly known as:  ZOCOR  Take 10 mg by mouth at bedtime.     triamcinolone ointment 0.1 %  Commonly known as:  KENALOG  Apply 1 application topically 2 (two) times daily. Use between thighs        Allergies:  Allergies  Allergen Reactions  . Penicillins Rash    Past Medical History  Diagnosis Date  . Hypertension   . Thyroid disease   . Renal insufficiency   . Hyperlipidemia   . ED (erectile dysfunction)   . Allergy   . 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 PVD (peripheral vascular disease) ([place])     has plastic aorta  . Arthritis   . Diverticulitis   . Seasonal allergies   . Hypothyroidism   . GERD (gastroesophageal reflux disease)     pepto   . Gout   . Diabetes mellitus     Type 2  . Pneumonia   . Anemia     Past Surgical History  Procedure Laterality Date  . Aortobifemoral bypass    . Breast surgery      left - granulomatous mastitis  . Sbo with lysis adhesions    . Eye surgery Bilateral     cataracts  . Colonoscopy    . Av fistula placement Left 12/01/2013    Procedure: ARTERIOVENOUS (AV) FISTULA CREATION- LEFT BRACHIOCEPHALIC;  Surgeon: Angelia Mould, MD;  Location: [place];  Service: Vascular;  Laterality: Left;  . Revison of arteriovenous fistula Left 02/09/2014    Procedure: REVISON OF LEFT ARTERIOVENOUS FISTULA - RESECTION OF RENDUNDANT VEIN;  Surgeon: Angelia Mould, MD;  Location: [place];  Service: Vascular;  Laterality: Left;  . Bascilic vein transposition Right 07/27/2014    Procedure: BASCILIC VEIN TRANSPOSITION;  Surgeon: Angelia Mould, MD;  Location: [place];  Service:  Vascular;  Laterality: Right;  . Shuntogram Left 04/19/2014    Procedure: FISTULOGRAM;  Surgeon: Angelia Mould, MD;  Location: [facility] CATH LAB;  Service: Cardiovascular;  Laterality: Left;  . Unilateral upper extremeity angiogram N/A 07/12/2014    Procedure: UNILATERAL UPPER Anselmo Rod;  Surgeon: Angelia Mould, MD;  Location: [facility] CATH LAB;  Service: Cardiovascular;  Laterality: N/A;    Family History  Problem Relation Age of Onset  . Aneurysm Mother   . Heart disease Father   . Stroke Father   . Hypertension Father   . Diabetes Father   . Dementia Neg Hx     Social History:  reports that he quit smoking about 21 years ago. He has never used smokeless tobacco. He reports that he does not drink alcohol or use illicit drugs.    Review of Systems    Lipid history: He has been treated with Atorvastatin, LDL relatively low    Lab Results  Component Value Date   CHOL 78* 01/10/2016   HDL 31* 01/10/2016   LDLCALC 33 01/10/2016  TRIG 68 01/10/2016   CHOLHDL 2.5 01/10/2016          He has ESRD on dialysis  Eyes:   Most recent eye exam was 2015?  Hypertension: was started on treatment in the 1980s      LABS:  No visits with results within 1 Week(s) from this visit. Latest known visit with results is:  Office Visit on 01/10/2016  Component Date Value Ref Range Status  . Hgb A1c MFr Bld 01/10/2016 7.2* <5.7 % Final   Comment:   For someone without known diabetes, a hemoglobin A1c value of 6.5% or greater indicates that they may have diabetes and this should be confirmed with a follow-up test.   For someone with known diabetes, a value <7% indicates that their diabetes is well controlled and a value greater than or equal to 7% indicates suboptimal control. A1c targets should be individualized based on duration of diabetes, age, comorbid conditions, and other considerations.   Currently, no consensus exists for use of hemoglobin A1c for diagnosis of  diabetes for children.     . Mean Plasma Glucose 01/10/2016 160   Final  . Cholesterol 01/10/2016 78* 125 - 200 mg/dL Final  . Triglycerides 01/10/2016 68  <150 mg/dL Final  . HDL 01/10/2016 31* >=40 mg/dL Final  . Total CHOL/HDL Ratio 01/10/2016 2.5  <=5.0 Ratio Final  . VLDL 01/10/2016 14  <30 mg/dL Final  . LDL Cholesterol 01/10/2016 33  <130 mg/dL Final   Comment:   Total Cholesterol/HDL Ratio:CHD Risk                        Coronary Heart Disease Risk Table                                        Men       Women          1/2 Average Risk              3.4        3.3              Average Risk              5.0        4.4           2X Average Risk              9.6        7.1           3X Average Risk             23.4       11.0 Use the calculated Patient Ratio above and the CHD Risk table  to determine the patient's CHD Risk.   . Sodium 01/10/2016 139  135 - 146 mmol/L Final  . Potassium 01/10/2016 4.0  3.5 - 5.3 mmol/L Final  . Chloride 01/10/2016 96* 98 - 110 mmol/L Final  . CO2 01/10/2016 33* 20 - 31 mmol/L Final  . Glucose, Bld 01/10/2016 118* 65 - 99 mg/dL Final  . BUN 01/10/2016 20  7 - 25 mg/dL Final  . Creat 01/10/2016 5.03* 0.70 - 1.18 mg/dL Final  . Total Bilirubin 01/10/2016 0.5  0.2 - 1.2 mg/dL Final  . Alkaline Phosphatase 01/10/2016 136* 40 - 115 U/L Final  . AST 01/10/2016 23  10 -  35 U/L Final  . ALT 01/10/2016 12  9 - 46 U/L Final  . Total Protein 01/10/2016 5.8* 6.1 - 8.1 g/dL Final  . Albumin 01/10/2016 3.6  3.6 - 5.1 g/dL Final  . Calcium 01/10/2016 8.6  8.6 - 10.3 mg/dL Final  . GFR, Est African American 01/10/2016 12* >=60 mL/min Final  . GFR, Est Non African American 01/10/2016 10* >=60 mL/min Final   Comment:   The estimated GFR is a calculation valid for adults (>=32 years old) that uses the CKD-EPI algorithm to adjust for age and sex. It is   not to be used for children, pregnant women, hospitalized patients,    patients on dialysis, or with rapidly  changing kidney function. According to the NKDEP, eGFR >89 is normal, 60-89 shows mild impairment, 30-59 shows moderate impairment, 15-29 shows severe impairment and <15 is ESRD.     . TSH 01/10/2016 1.80  0.40 - 4.50 mIU/L Final  . Phosphorus 01/10/2016 4.2  2.1 - 4.3 mg/dL Final    Physical Examination:  BP 136/55 mmHg  Pulse 60  Temp(Src) 98 F (36.7 C)  Resp 14  Ht 5' 8"  (1.727 m)  Wt 199 lb 3.2 oz (90.357 kg)  BMI 30.30 kg/m2  SpO2 98%      ASSESSMENT:  Diabetes type 2, uncontrolled with A1c of 8.9% See history of present illness for detailed discussion of his current management, blood sugar patterns and problems identified Since he is checking only fasting readings and not everyday difficult to get a blood sugar pattern Discussed in detail that he can get test strips for brand name glucose monitor on the drugstore through Medicare part B He was given a sample meter today and will start using the FreeStyle meter which was demonstrated  Since fasting readings are fairly good without overnight hypoglycemia he likely is getting a proper dosage of Lantus with 10 units now No readings done after meals and difficult to know if he has post prandial hyperglycemia Diet is variable Overall benefited from restarting Actos for his insulin resistance and is tolerating this well  PLAN:   He will check his blood sugars consistently at least twice a day at various times including after meals and bring his new monitor for download on each visit  May adjust his mealtime dose +/-2 units for larger or smaller meals  He will check with the [facility] to see if he can get insulin pen instead of syringes for his Novolog  Discussed blood sugar targets at various meals  Not clear if he is having high readings after his lunch which is relatively smaller than other meals, discussed he may need smaller dose of Novolog at this meal also   Patient Instructions  Check blood sugars on  waking up 3  times a week  Also check blood sugars about 2 hours after a meal and do this after different meals by rotation  Recommended blood sugar levels on waking up is 90-130 and about 2 hours after meal is 130-160  Please bring your blood sugar monitor to each visit, thank you  If sugar >190 after lunch take 4 Novolog midday with snack    Counseling time on subjects discussed above is over 50% of today's 25 minute visit    [name] 01/31/2016, 9:06 PM   Note: This office note was prepared with Estate agent. Any transcriptional errors that result from this process are unintentional.

## 2016-02-01 ENCOUNTER — Encounter: Payer: Self-pay | Admitting: *Deleted

## 2016-02-01 DIAGNOSIS — H43813 Vitreous degeneration, bilateral: Secondary | ICD-10-CM | POA: Diagnosis not present

## 2016-02-01 DIAGNOSIS — E1129 Type 2 diabetes mellitus with other diabetic kidney complication: Secondary | ICD-10-CM | POA: Diagnosis not present

## 2016-02-01 DIAGNOSIS — H52203 Unspecified astigmatism, bilateral: Secondary | ICD-10-CM | POA: Diagnosis not present

## 2016-02-01 DIAGNOSIS — N186 End stage renal disease: Secondary | ICD-10-CM | POA: Diagnosis not present

## 2016-02-01 DIAGNOSIS — N2581 Secondary hyperparathyroidism of renal origin: Secondary | ICD-10-CM | POA: Diagnosis not present

## 2016-02-01 DIAGNOSIS — D631 Anemia in chronic kidney disease: Secondary | ICD-10-CM | POA: Diagnosis not present

## 2016-02-01 DIAGNOSIS — Z961 Presence of intraocular lens: Secondary | ICD-10-CM | POA: Diagnosis not present

## 2016-02-01 DIAGNOSIS — E113293 Type 2 diabetes mellitus with mild nonproliferative diabetic retinopathy without macular edema, bilateral: Secondary | ICD-10-CM | POA: Diagnosis not present

## 2016-02-01 LAB — HM DIABETES EYE EXAM

## 2016-02-03 DIAGNOSIS — N186 End stage renal disease: Secondary | ICD-10-CM | POA: Diagnosis not present

## 2016-02-03 DIAGNOSIS — D631 Anemia in chronic kidney disease: Secondary | ICD-10-CM | POA: Diagnosis not present

## 2016-02-03 DIAGNOSIS — E1129 Type 2 diabetes mellitus with other diabetic kidney complication: Secondary | ICD-10-CM | POA: Diagnosis not present

## 2016-02-03 DIAGNOSIS — N2581 Secondary hyperparathyroidism of renal origin: Secondary | ICD-10-CM | POA: Diagnosis not present

## 2016-02-06 DIAGNOSIS — E1129 Type 2 diabetes mellitus with other diabetic kidney complication: Secondary | ICD-10-CM | POA: Diagnosis not present

## 2016-02-06 DIAGNOSIS — N186 End stage renal disease: Secondary | ICD-10-CM | POA: Diagnosis not present

## 2016-02-06 DIAGNOSIS — D631 Anemia in chronic kidney disease: Secondary | ICD-10-CM | POA: Diagnosis not present

## 2016-02-06 DIAGNOSIS — N2581 Secondary hyperparathyroidism of renal origin: Secondary | ICD-10-CM | POA: Diagnosis not present

## 2016-02-08 DIAGNOSIS — N186 End stage renal disease: Secondary | ICD-10-CM | POA: Diagnosis not present

## 2016-02-08 DIAGNOSIS — E1129 Type 2 diabetes mellitus with other diabetic kidney complication: Secondary | ICD-10-CM | POA: Diagnosis not present

## 2016-02-08 DIAGNOSIS — D631 Anemia in chronic kidney disease: Secondary | ICD-10-CM | POA: Diagnosis not present

## 2016-02-08 DIAGNOSIS — N2581 Secondary hyperparathyroidism of renal origin: Secondary | ICD-10-CM | POA: Diagnosis not present

## 2016-02-10 DIAGNOSIS — N186 End stage renal disease: Secondary | ICD-10-CM | POA: Diagnosis not present

## 2016-02-10 DIAGNOSIS — E1129 Type 2 diabetes mellitus with other diabetic kidney complication: Secondary | ICD-10-CM | POA: Diagnosis not present

## 2016-02-10 DIAGNOSIS — D631 Anemia in chronic kidney disease: Secondary | ICD-10-CM | POA: Diagnosis not present

## 2016-02-10 DIAGNOSIS — N2581 Secondary hyperparathyroidism of renal origin: Secondary | ICD-10-CM | POA: Diagnosis not present

## 2016-02-13 DIAGNOSIS — D631 Anemia in chronic kidney disease: Secondary | ICD-10-CM | POA: Diagnosis not present

## 2016-02-13 DIAGNOSIS — N2581 Secondary hyperparathyroidism of renal origin: Secondary | ICD-10-CM | POA: Diagnosis not present

## 2016-02-13 DIAGNOSIS — N186 End stage renal disease: Secondary | ICD-10-CM | POA: Diagnosis not present

## 2016-02-13 DIAGNOSIS — E1129 Type 2 diabetes mellitus with other diabetic kidney complication: Secondary | ICD-10-CM | POA: Diagnosis not present

## 2016-02-15 DIAGNOSIS — D631 Anemia in chronic kidney disease: Secondary | ICD-10-CM | POA: Diagnosis not present

## 2016-02-15 DIAGNOSIS — E1129 Type 2 diabetes mellitus with other diabetic kidney complication: Secondary | ICD-10-CM | POA: Diagnosis not present

## 2016-02-15 DIAGNOSIS — N186 End stage renal disease: Secondary | ICD-10-CM | POA: Diagnosis not present

## 2016-02-15 DIAGNOSIS — N2581 Secondary hyperparathyroidism of renal origin: Secondary | ICD-10-CM | POA: Diagnosis not present

## 2016-02-17 DIAGNOSIS — N2581 Secondary hyperparathyroidism of renal origin: Secondary | ICD-10-CM | POA: Diagnosis not present

## 2016-02-17 DIAGNOSIS — D631 Anemia in chronic kidney disease: Secondary | ICD-10-CM | POA: Diagnosis not present

## 2016-02-17 DIAGNOSIS — E1129 Type 2 diabetes mellitus with other diabetic kidney complication: Secondary | ICD-10-CM | POA: Diagnosis not present

## 2016-02-17 DIAGNOSIS — N186 End stage renal disease: Secondary | ICD-10-CM | POA: Diagnosis not present

## 2016-02-20 DIAGNOSIS — N2581 Secondary hyperparathyroidism of renal origin: Secondary | ICD-10-CM | POA: Diagnosis not present

## 2016-02-20 DIAGNOSIS — D631 Anemia in chronic kidney disease: Secondary | ICD-10-CM | POA: Diagnosis not present

## 2016-02-20 DIAGNOSIS — E1129 Type 2 diabetes mellitus with other diabetic kidney complication: Secondary | ICD-10-CM | POA: Diagnosis not present

## 2016-02-20 DIAGNOSIS — N186 End stage renal disease: Secondary | ICD-10-CM | POA: Diagnosis not present

## 2016-02-22 ENCOUNTER — Other Ambulatory Visit: Payer: Self-pay | Admitting: *Deleted

## 2016-02-22 ENCOUNTER — Telehealth: Payer: Self-pay | Admitting: Endocrinology

## 2016-02-22 DIAGNOSIS — E1129 Type 2 diabetes mellitus with other diabetic kidney complication: Secondary | ICD-10-CM | POA: Diagnosis not present

## 2016-02-22 DIAGNOSIS — Z992 Dependence on renal dialysis: Secondary | ICD-10-CM | POA: Diagnosis not present

## 2016-02-22 DIAGNOSIS — E1122 Type 2 diabetes mellitus with diabetic chronic kidney disease: Secondary | ICD-10-CM | POA: Diagnosis not present

## 2016-02-22 DIAGNOSIS — D631 Anemia in chronic kidney disease: Secondary | ICD-10-CM | POA: Diagnosis not present

## 2016-02-22 DIAGNOSIS — N2581 Secondary hyperparathyroidism of renal origin: Secondary | ICD-10-CM | POA: Diagnosis not present

## 2016-02-22 DIAGNOSIS — N186 End stage renal disease: Secondary | ICD-10-CM | POA: Diagnosis not present

## 2016-02-22 MED ORDER — FREESTYLE LANCETS MISC: Status: AC

## 2016-02-22 MED ORDER — GLUCOSE BLOOD VI STRP
ORAL_STRIP | Status: DC
Start: 2016-02-22 — End: 2016-03-01

## 2016-02-22 NOTE — Telephone Encounter (Signed)
PT is completely out of the Strips and Lancets for the One Touch Verio IQ, needs them sent to [facility].

## 2016-02-22 NOTE — Telephone Encounter (Signed)
Rx faxed

## 2016-02-24 DIAGNOSIS — D631 Anemia in chronic kidney disease: Secondary | ICD-10-CM | POA: Diagnosis not present

## 2016-02-24 DIAGNOSIS — E1129 Type 2 diabetes mellitus with other diabetic kidney complication: Secondary | ICD-10-CM | POA: Diagnosis not present

## 2016-02-24 DIAGNOSIS — N186 End stage renal disease: Secondary | ICD-10-CM | POA: Diagnosis not present

## 2016-02-24 DIAGNOSIS — N2581 Secondary hyperparathyroidism of renal origin: Secondary | ICD-10-CM | POA: Diagnosis not present

## 2016-02-27 DIAGNOSIS — D631 Anemia in chronic kidney disease: Secondary | ICD-10-CM | POA: Diagnosis not present

## 2016-02-27 DIAGNOSIS — N2581 Secondary hyperparathyroidism of renal origin: Secondary | ICD-10-CM | POA: Diagnosis not present

## 2016-02-27 DIAGNOSIS — N186 End stage renal disease: Secondary | ICD-10-CM | POA: Diagnosis not present

## 2016-02-27 DIAGNOSIS — E1129 Type 2 diabetes mellitus with other diabetic kidney complication: Secondary | ICD-10-CM | POA: Diagnosis not present

## 2016-02-29 DIAGNOSIS — N186 End stage renal disease: Secondary | ICD-10-CM | POA: Diagnosis not present

## 2016-02-29 DIAGNOSIS — E1129 Type 2 diabetes mellitus with other diabetic kidney complication: Secondary | ICD-10-CM | POA: Diagnosis not present

## 2016-02-29 DIAGNOSIS — N2581 Secondary hyperparathyroidism of renal origin: Secondary | ICD-10-CM | POA: Diagnosis not present

## 2016-02-29 DIAGNOSIS — D631 Anemia in chronic kidney disease: Secondary | ICD-10-CM | POA: Diagnosis not present

## 2016-03-01 ENCOUNTER — Other Ambulatory Visit (INDEPENDENT_AMBULATORY_CARE_PROVIDER_SITE_OTHER): Payer: Medicare Other

## 2016-03-01 ENCOUNTER — Telehealth: Payer: Self-pay | Admitting: Endocrinology

## 2016-03-01 DIAGNOSIS — E1165 Type 2 diabetes mellitus with hyperglycemia: Secondary | ICD-10-CM | POA: Diagnosis not present

## 2016-03-01 DIAGNOSIS — Z794 Long term (current) use of insulin: Secondary | ICD-10-CM | POA: Diagnosis not present

## 2016-03-01 LAB — GLUCOSE, RANDOM: Glucose, Bld: 272 mg/dL — ABNORMAL HIGH (ref 70–99)

## 2016-03-01 MED ORDER — GLUCOSE BLOOD VI STRP
ORAL_STRIP | Status: DC
Start: 2016-03-01 — End: 2018-08-13

## 2016-03-01 NOTE — Telephone Encounter (Signed)
rx submitted per pt's request.  

## 2016-03-01 NOTE — Telephone Encounter (Signed)
Pt needs one touch verio test strips called in to [facility] on [address] please

## 2016-03-02 DIAGNOSIS — D631 Anemia in chronic kidney disease: Secondary | ICD-10-CM | POA: Diagnosis not present

## 2016-03-02 DIAGNOSIS — N2581 Secondary hyperparathyroidism of renal origin: Secondary | ICD-10-CM | POA: Diagnosis not present

## 2016-03-02 DIAGNOSIS — E1129 Type 2 diabetes mellitus with other diabetic kidney complication: Secondary | ICD-10-CM | POA: Diagnosis not present

## 2016-03-02 DIAGNOSIS — N186 End stage renal disease: Secondary | ICD-10-CM | POA: Diagnosis not present

## 2016-03-02 LAB — FRUCTOSAMINE: Fructosamine: 357 umol/L — ABNORMAL HIGH (ref 0–285)

## 2016-03-05 DIAGNOSIS — N186 End stage renal disease: Secondary | ICD-10-CM | POA: Diagnosis not present

## 2016-03-05 DIAGNOSIS — E1129 Type 2 diabetes mellitus with other diabetic kidney complication: Secondary | ICD-10-CM | POA: Diagnosis not present

## 2016-03-05 DIAGNOSIS — D631 Anemia in chronic kidney disease: Secondary | ICD-10-CM | POA: Diagnosis not present

## 2016-03-05 DIAGNOSIS — N2581 Secondary hyperparathyroidism of renal origin: Secondary | ICD-10-CM | POA: Diagnosis not present

## 2016-03-07 DIAGNOSIS — E1129 Type 2 diabetes mellitus with other diabetic kidney complication: Secondary | ICD-10-CM | POA: Diagnosis not present

## 2016-03-07 DIAGNOSIS — N2581 Secondary hyperparathyroidism of renal origin: Secondary | ICD-10-CM | POA: Diagnosis not present

## 2016-03-07 DIAGNOSIS — D631 Anemia in chronic kidney disease: Secondary | ICD-10-CM | POA: Diagnosis not present

## 2016-03-07 DIAGNOSIS — N186 End stage renal disease: Secondary | ICD-10-CM | POA: Diagnosis not present

## 2016-03-08 ENCOUNTER — Other Ambulatory Visit: Payer: Self-pay

## 2016-03-08 ENCOUNTER — Ambulatory Visit (INDEPENDENT_AMBULATORY_CARE_PROVIDER_SITE_OTHER): Payer: Medicare Other | Admitting: Endocrinology

## 2016-03-08 ENCOUNTER — Encounter: Payer: Self-pay | Admitting: Endocrinology

## 2016-03-08 VITALS — BP 142/52 | HR 54 | Ht 68.0 in

## 2016-03-08 DIAGNOSIS — E1165 Type 2 diabetes mellitus with hyperglycemia: Secondary | ICD-10-CM | POA: Diagnosis not present

## 2016-03-08 DIAGNOSIS — Z794 Long term (current) use of insulin: Secondary | ICD-10-CM

## 2016-03-08 MED ORDER — FREESTYLE FREEDOM LITE W/DEVICE KIT: PACK | Status: DC

## 2016-03-08 NOTE — Patient Instructions (Addendum)
Reduce Bfst and supper Novolog to 8  For large lunch take 10 Novolog on Sundays just before the meal  Lantus 10 units in am  Check blood sugars on waking up 4-5  times a week Also check blood sugars about 2 hours after a meal and do this after different meals by rotation  Recommended blood sugar levels on waking up is 90-130 and about 2 hours after meal is 130-160  Please bring your blood sugar monitor to each visit, thank you

## 2016-03-08 NOTE — Progress Notes (Signed)
Patient ID: JKAI ARWOOD, male   DOB: 22-Oct-1940, 75 y.o.   MRN: [id number]           Reason for Appointment:  Follow-up for Type 2 Diabetes  Referring physician: [place]  History of Present Illness:          Date of diagnosis of type 2 diabetes mellitus :        Background history:  He has had long-standing diabetes probably treated with metformin initially and subsequently with sulfonylurea drugs At some point he was also given Actos in addition to his glipizide which he is still taking Records of his control are only available for the last 4 years Over the last few years his A1c has been generally in the 6-7% range His A1c was 6.2 in May 2016 and apparently was starting to get low normal blood sugars at times He was admitted to the hospital for fluid overload and renal failure and at that time his Actos was stopped Subsequently his blood sugars have been markedly increased  Recent history:   He was switched from regular insulin to NovoLog on his initial consultation His last A1c is 7.2, previously as high as 8.9  INSULIN regimen is described as: Lantus 10 units in am; 10 units Novolog ac bid with syringe    Dialysis is done at 12 noon, doing it on Monday/Wednesday/Fridays     He is here for short-term follow-up after starting him on glucose monitoring with the new meter       Current blood sugar patterns and problems identified:  He now is using both the Verio and the precision glucose monitor  FASTING blood sugars are relatively good recently   Hypoglycemia: Twice recently, once at 2 AM and another episode at 2:30 AM, glucose readings 60 and 69  Although he claims that his blood sugars are high when he comes back from dialysis in the afternoon he does not have any readings at those times except today glucose is 82 without any lunch.  He thinks he is eating only snacks like 2 packets of peanut butter crackers and some lollipops  Most of his blood sugars around suppertime  are variable and  higher on Saturdays and once on Monday  Not doing any readings after his evening meal as directed  His suppertime readings are occasionally normal but occasionally over 200  He was told to take 8 units of Novolog at mealtimes but on his own he is taking 10 units  Oral hypoglycemic drugs the patient is taking are: Actos 30 mg daily in a.m.     Side effects from medications have been: None  Compliance with the medical regimen: Good   Glucose monitoring:  done 0-2  times a day         Glucometer:  Precision  Blood Glucose readings by review of home monitoring:   Mean values apply above for all meters except median for One Touch  PRE-MEAL Fasting Lunch Dinner Bedtime Overall  Glucose range: 101-153    102-224    Mean/median:    186     POST-MEAL PC Breakfast PC Lunch PC Dinner  Glucose range:     Mean/median:        Self-care: The diet that the patient has been following is: tries to limit sweets, portions. eating out only about once a week at a steak house        Typical meal intake: Breakfast is  eggs and protein shake, usually light lunch  with mostly snacks like peanut butter crackers or half sandwich; supper 8 pm   Lunch is irregular on nondialysis days but has a significant lunch on Sundays             Dietician visit, most recent: ?               Exercise: none, not able to do much   Weight history:  Wt Readings from Last 3 Encounters:  01/31/16 199 lb 3.2 oz (90.357 kg)  01/12/16 203 lb 6.4 oz (92.262 kg)  01/10/16 201 lb (91.173 kg)    Glycemic control:   Lab Results  Component Value Date   HGBA1C 7.2* 01/10/2016   HGBA1C 6.4 12/07/2015   HGBA1C 8.9* 09/02/2015   Lab Results  Component Value Date   MICROALBUR 354.8* 02/14/2015   LDLCALC 33 01/10/2016   CREATININE 5.03* 01/10/2016         Medication List       This list is accurate as of: 03/08/16 11:59 PM.  Always use your most recent med list.               acetaminophen  500 MG tablet  Commonly known as:  TYLENOL  Take 500 mg by mouth every 6 (six) hours as needed.     allopurinol 100 MG tablet  Commonly known as:  ZYLOPRIM  Take 100 mg by mouth daily.     aspirin 325 MG tablet  Take 650 mg by mouth daily.     atorvastatin 40 MG tablet  Commonly known as:  LIPITOR  Take 1 tablet (40 mg total) by mouth daily.     carvedilol 12.5 MG tablet  Commonly known as:  COREG  Take 12.5 mg by mouth 2 (two) times daily with a meal.     clobetasol 0.05 % external solution  Commonly known as:  TEMOVATE  apply to affected area ON THE SCALP UP TO 2 TIMES A DAY AS NEEDED...  (REFER TO PRESCRIPTION NOTES).     donepezil 10 MG tablet  Commonly known as:  ARICEPT  Take 1 tablet (10 mg total) by mouth at bedtime.     FLUOCINOLONE ACETONIDE SCALP 0.01 % Oil  apply to affected area ON SCALP EVERY NIGHT AT BEDTIME, WASH UPON AWAKENING     fluticasone 0.05 % cream  Commonly known as:  CUTIVATE  apply to affected area ON FACE UP TO 2 TIMES A DAY AS NEEDED     fluticasone 50 MCG/ACT nasal spray  Commonly known as:  FLONASE  Place 1 spray into both nostrils daily.     FREESTYLE FREEDOM LITE w/Device Kit  Use to check blood sugar 2 times per day dx code E11.65     freestyle lancets  Use as instructed to check blood sugar 2 times per day dx code E11.65     glucose blood test strip  Commonly known as:  ONETOUCH VERIO  Use to check blood sugar 2 times per day. Dx code E11.65     insulin aspart 100 UNIT/ML injection  Commonly known as:  novoLOG  Inject 10 Units into the skin 3 (three) times daily before meals.     insulin glargine 100 UNIT/ML injection  Commonly known as:  LANTUS  Inject 0.1 mLs (10 Units total) into the skin daily.     ketoconazole 2 % shampoo  Commonly known as:  NIZORAL  Apply 1 application topically 2 (two) times a week.     levothyroxine 50 MCG  tablet  Commonly known as:  SYNTHROID, LEVOTHROID  Take 1 tablet (50 mcg total) by mouth  daily.     lidocaine-prilocaine cream  Commonly known as:  EMLA     loratadine 10 MG tablet  Commonly known as:  CLARITIN  Take 10 mg by mouth daily as needed for allergies.     metroNIDAZOLE 0.75 % gel  Commonly known as:  METROGEL  APPLY TO AFFECTED AREA ON SKIN  ON FACE TWICE DAILY     mupirocin ointment 2 %  Commonly known as:  BACTROBAN  Use as directed to affected areas     Naftifine HCl 2 % Crea  Commonly known as:  NAFTIN  Apply to feet daily for fungal infection     pioglitazone 30 MG tablet  Commonly known as:  ACTOS  Take 1 tablet (30 mg total) by mouth daily.     RENVELA 800 MG tablet  Generic drug:  sevelamer carbonate  Take 800 mg by mouth 3 (three) times daily with meals. Takes 3  Tablets with each meal and 1 tablet with snack     SENSIPAR 30 MG tablet  Generic drug:  cinacalcet     simvastatin 20 MG tablet  Commonly known as:  ZOCOR  Take 10 mg by mouth at bedtime.     triamcinolone ointment 0.1 %  Commonly known as:  KENALOG  Apply 1 application topically 2 (two) times daily. Use between thighs        Allergies:  Allergies  Allergen Reactions  . Penicillins Rash    Past Medical History  Diagnosis Date  . Hypertension   . Thyroid disease   . Renal insufficiency   . Hyperlipidemia   . ED (erectile dysfunction)   . Allergy   . 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 PVD (peripheral vascular disease) ([place])     has plastic aorta  . Arthritis   . Diverticulitis   . Seasonal allergies   . Hypothyroidism   . GERD (gastroesophageal reflux disease)     pepto   . Gout   . Diabetes mellitus     Type 2  . Pneumonia   . Anemia     Past Surgical History  Procedure Laterality Date  . Aortobifemoral bypass    . Breast surgery      left - granulomatous mastitis  . Sbo with lysis adhesions    . Eye surgery Bilateral     cataracts  . Colonoscopy    . Av fistula placement Left 12/01/2013    Procedure: ARTERIOVENOUS (AV) FISTULA CREATION- LEFT  BRACHIOCEPHALIC;  Surgeon: Angelia Mould, MD;  Location: [place];  Service: Vascular;  Laterality: Left;  . Revison of arteriovenous fistula Left 02/09/2014    Procedure: REVISON OF LEFT ARTERIOVENOUS FISTULA - RESECTION OF RENDUNDANT VEIN;  Surgeon: Angelia Mould, MD;  Location: [place];  Service: Vascular;  Laterality: Left;  . Bascilic vein transposition Right 07/27/2014    Procedure: BASCILIC VEIN TRANSPOSITION;  Surgeon: Angelia Mould, MD;  Location: [place];  Service: Vascular;  Laterality: Right;  . Shuntogram Left 04/19/2014    Procedure: FISTULOGRAM;  Surgeon: Angelia Mould, MD;  Location: [facility] C[facility] LAB;  Service: Cardiovascular;  Laterality: Left;  . Unilateral upper extremeity angiogram N/A 07/12/2014    Procedure: UNILATERAL UPPER Anselmo Rod;  Surgeon: Angelia Mould, MD;  Location: [facility] C[facility] LAB;  Service: Cardiovascular;  Laterality: N/A;    Family History  Problem Relation Age of Onset  .  Aneurysm Mother   . Heart disease Father   . Stroke Father   . Hypertension Father   . Diabetes Father   . Dementia Neg Hx     Social History:  reports that he quit smoking about 21 years ago. He has never used smokeless tobacco. He reports that he does not drink alcohol or use illicit drugs.    Review of Systems    Lipid history: He has been treated with Atorvastatin, LDL relatively low    Lab Results  Component Value Date   CHOL 78* 01/10/2016   HDL 31* 01/10/2016   LDLCALC 33 01/10/2016   TRIG 68 01/10/2016   CHOLHDL 2.5 01/10/2016          He has ESRD on dialysis  Eyes:   Most recent eye exam was 2015?  Hypertension: was started on treatment in the 1980s     LABS:  No visits with results within 1 Week(s) from this visit. Latest known visit with results is:  Lab on 03/01/2016  Component Date Value Ref Range Status  . Fructosamine 03/01/2016 357* 0 - 285 umol/L Final   Comment: Published reference interval for apparently  healthy subjects between age 66 and 68 is 72 - 285 umol/L and in a poorly controlled diabetic population is 228 - 563 umol/L with a mean of 396 umol/L.   Marland Kitchen Glucose, Bld 03/01/2016 272* 70 - 99 mg/dL Final    Physical Examination:  BP 142/52 mmHg  Pulse 54  Ht 5' 8"  (1.727 m)  SpO2 97%      ASSESSMENT:  Diabetes type 2, uncontrolled with A1c of 8.9% See history of present illness for detailed discussion of his current management, blood sugar patterns and problems identified  He has started checking his blood sugars more consistently but only before breakfast and supper As discussed above he has significant variability in blood sugars before suppertime and not clear why He has several questions about how to take his insulin doses and about his coverage for frequency snacks during his dialysis which is around lunchtime Not clear why his blood sugars are variable in the evenings before supper which is relatively late and may be related to snacks  He does not cover his lunch with any insulin even when he has a big lunch on Sundays Hypoglycemia: He has had a couple of episodes at 2-3 AM, not clear why since he takes his mealtime insulin around 8 PM and fasting readings are not low No readings done after any meals and difficult to know if he has post prandial hyperglycemia Diet is variable Overall benefited from restarting Actos for his insulin resistance and is tolerating this well  PLAN:   He will check his blood sugars more often at bedtime  He will need to take insulin to cover his lunch on Sundays with 10 units Novolog  Will reduce his breakfast and supper doses to 8 units unless eating a large meal because of tendency to hypoglycemia overnight and also eating relatively smaller meal at breakfast  Discussed getting brand name test strips and he will pick up the freestyle meter that was prescribed.  Apparently this is only meter that is covered by his insurance  Discussed  blood sugar targets  May consider repeating continuous glucose monitoring his A1c goes up  No change in Lantus   Patient Instructions  Reduce Bfst and supper Novolog to 8  For large lunch take 10 Novolog on Sundays just before the meal  Lantus 10  units in am  Check blood sugars on waking up 4-5  times a week Also check blood sugars about 2 hours after a meal and do this after different meals by rotation  Recommended blood sugar levels on waking up is 90-130 and about 2 hours after meal is 130-160  Please bring your blood sugar monitor to each visit, thank you     Counseling time on subjects discussed above is over 50% of today's 25 minute visit      Danay Mckellar 03/09/2016, 8:26 PM   Note: This office note was prepared with Dragon voice recognition system technology. Any transcriptional errors that result from this process are unintentional.

## 2016-03-09 DIAGNOSIS — E1129 Type 2 diabetes mellitus with other diabetic kidney complication: Secondary | ICD-10-CM | POA: Diagnosis not present

## 2016-03-09 DIAGNOSIS — N2581 Secondary hyperparathyroidism of renal origin: Secondary | ICD-10-CM | POA: Diagnosis not present

## 2016-03-09 DIAGNOSIS — D631 Anemia in chronic kidney disease: Secondary | ICD-10-CM | POA: Diagnosis not present

## 2016-03-09 DIAGNOSIS — N186 End stage renal disease: Secondary | ICD-10-CM | POA: Diagnosis not present

## 2016-03-12 DIAGNOSIS — N186 End stage renal disease: Secondary | ICD-10-CM | POA: Diagnosis not present

## 2016-03-12 DIAGNOSIS — E1129 Type 2 diabetes mellitus with other diabetic kidney complication: Secondary | ICD-10-CM | POA: Diagnosis not present

## 2016-03-12 DIAGNOSIS — N2581 Secondary hyperparathyroidism of renal origin: Secondary | ICD-10-CM | POA: Diagnosis not present

## 2016-03-12 DIAGNOSIS — D631 Anemia in chronic kidney disease: Secondary | ICD-10-CM | POA: Diagnosis not present

## 2016-03-14 DIAGNOSIS — N186 End stage renal disease: Secondary | ICD-10-CM | POA: Diagnosis not present

## 2016-03-14 DIAGNOSIS — E1129 Type 2 diabetes mellitus with other diabetic kidney complication: Secondary | ICD-10-CM | POA: Diagnosis not present

## 2016-03-14 DIAGNOSIS — N2581 Secondary hyperparathyroidism of renal origin: Secondary | ICD-10-CM | POA: Diagnosis not present

## 2016-03-14 DIAGNOSIS — D631 Anemia in chronic kidney disease: Secondary | ICD-10-CM | POA: Diagnosis not present

## 2016-03-16 DIAGNOSIS — N2581 Secondary hyperparathyroidism of renal origin: Secondary | ICD-10-CM | POA: Diagnosis not present

## 2016-03-16 DIAGNOSIS — E1129 Type 2 diabetes mellitus with other diabetic kidney complication: Secondary | ICD-10-CM | POA: Diagnosis not present

## 2016-03-16 DIAGNOSIS — D631 Anemia in chronic kidney disease: Secondary | ICD-10-CM | POA: Diagnosis not present

## 2016-03-16 DIAGNOSIS — N186 End stage renal disease: Secondary | ICD-10-CM | POA: Diagnosis not present

## 2016-03-19 DIAGNOSIS — N2581 Secondary hyperparathyroidism of renal origin: Secondary | ICD-10-CM | POA: Diagnosis not present

## 2016-03-19 DIAGNOSIS — E1129 Type 2 diabetes mellitus with other diabetic kidney complication: Secondary | ICD-10-CM | POA: Diagnosis not present

## 2016-03-19 DIAGNOSIS — D631 Anemia in chronic kidney disease: Secondary | ICD-10-CM | POA: Diagnosis not present

## 2016-03-19 DIAGNOSIS — N186 End stage renal disease: Secondary | ICD-10-CM | POA: Diagnosis not present

## 2016-03-21 DIAGNOSIS — D631 Anemia in chronic kidney disease: Secondary | ICD-10-CM | POA: Diagnosis not present

## 2016-03-21 DIAGNOSIS — E1129 Type 2 diabetes mellitus with other diabetic kidney complication: Secondary | ICD-10-CM | POA: Diagnosis not present

## 2016-03-21 DIAGNOSIS — N186 End stage renal disease: Secondary | ICD-10-CM | POA: Diagnosis not present

## 2016-03-21 DIAGNOSIS — N2581 Secondary hyperparathyroidism of renal origin: Secondary | ICD-10-CM | POA: Diagnosis not present

## 2016-03-23 DIAGNOSIS — Z992 Dependence on renal dialysis: Secondary | ICD-10-CM | POA: Diagnosis not present

## 2016-03-23 DIAGNOSIS — N2581 Secondary hyperparathyroidism of renal origin: Secondary | ICD-10-CM | POA: Diagnosis not present

## 2016-03-23 DIAGNOSIS — E1129 Type 2 diabetes mellitus with other diabetic kidney complication: Secondary | ICD-10-CM | POA: Diagnosis not present

## 2016-03-23 DIAGNOSIS — D631 Anemia in chronic kidney disease: Secondary | ICD-10-CM | POA: Diagnosis not present

## 2016-03-23 DIAGNOSIS — E1122 Type 2 diabetes mellitus with diabetic chronic kidney disease: Secondary | ICD-10-CM | POA: Diagnosis not present

## 2016-03-23 DIAGNOSIS — N186 End stage renal disease: Secondary | ICD-10-CM | POA: Diagnosis not present

## 2016-03-26 DIAGNOSIS — E1129 Type 2 diabetes mellitus with other diabetic kidney complication: Secondary | ICD-10-CM | POA: Diagnosis not present

## 2016-03-26 DIAGNOSIS — D631 Anemia in chronic kidney disease: Secondary | ICD-10-CM | POA: Diagnosis not present

## 2016-03-26 DIAGNOSIS — N186 End stage renal disease: Secondary | ICD-10-CM | POA: Diagnosis not present

## 2016-03-26 DIAGNOSIS — N2581 Secondary hyperparathyroidism of renal origin: Secondary | ICD-10-CM | POA: Diagnosis not present

## 2016-03-28 DIAGNOSIS — E1129 Type 2 diabetes mellitus with other diabetic kidney complication: Secondary | ICD-10-CM | POA: Diagnosis not present

## 2016-03-28 DIAGNOSIS — N2581 Secondary hyperparathyroidism of renal origin: Secondary | ICD-10-CM | POA: Diagnosis not present

## 2016-03-28 DIAGNOSIS — N186 End stage renal disease: Secondary | ICD-10-CM | POA: Diagnosis not present

## 2016-03-28 DIAGNOSIS — D631 Anemia in chronic kidney disease: Secondary | ICD-10-CM | POA: Diagnosis not present

## 2016-03-30 DIAGNOSIS — E1129 Type 2 diabetes mellitus with other diabetic kidney complication: Secondary | ICD-10-CM | POA: Diagnosis not present

## 2016-03-30 DIAGNOSIS — D631 Anemia in chronic kidney disease: Secondary | ICD-10-CM | POA: Diagnosis not present

## 2016-03-30 DIAGNOSIS — N186 End stage renal disease: Secondary | ICD-10-CM | POA: Diagnosis not present

## 2016-03-30 DIAGNOSIS — N2581 Secondary hyperparathyroidism of renal origin: Secondary | ICD-10-CM | POA: Diagnosis not present

## 2016-04-02 DIAGNOSIS — N2581 Secondary hyperparathyroidism of renal origin: Secondary | ICD-10-CM | POA: Diagnosis not present

## 2016-04-02 DIAGNOSIS — D631 Anemia in chronic kidney disease: Secondary | ICD-10-CM | POA: Diagnosis not present

## 2016-04-02 DIAGNOSIS — N186 End stage renal disease: Secondary | ICD-10-CM | POA: Diagnosis not present

## 2016-04-02 DIAGNOSIS — L218 Other seborrheic dermatitis: Secondary | ICD-10-CM | POA: Diagnosis not present

## 2016-04-02 DIAGNOSIS — E1129 Type 2 diabetes mellitus with other diabetic kidney complication: Secondary | ICD-10-CM | POA: Diagnosis not present

## 2016-04-04 DIAGNOSIS — N2581 Secondary hyperparathyroidism of renal origin: Secondary | ICD-10-CM | POA: Diagnosis not present

## 2016-04-04 DIAGNOSIS — N186 End stage renal disease: Secondary | ICD-10-CM | POA: Diagnosis not present

## 2016-04-04 DIAGNOSIS — E1129 Type 2 diabetes mellitus with other diabetic kidney complication: Secondary | ICD-10-CM | POA: Diagnosis not present

## 2016-04-04 DIAGNOSIS — D631 Anemia in chronic kidney disease: Secondary | ICD-10-CM | POA: Diagnosis not present

## 2016-04-06 DIAGNOSIS — N186 End stage renal disease: Secondary | ICD-10-CM | POA: Diagnosis not present

## 2016-04-06 DIAGNOSIS — E1129 Type 2 diabetes mellitus with other diabetic kidney complication: Secondary | ICD-10-CM | POA: Diagnosis not present

## 2016-04-06 DIAGNOSIS — N2581 Secondary hyperparathyroidism of renal origin: Secondary | ICD-10-CM | POA: Diagnosis not present

## 2016-04-06 DIAGNOSIS — D631 Anemia in chronic kidney disease: Secondary | ICD-10-CM | POA: Diagnosis not present

## 2016-04-09 DIAGNOSIS — N186 End stage renal disease: Secondary | ICD-10-CM | POA: Diagnosis not present

## 2016-04-09 DIAGNOSIS — D631 Anemia in chronic kidney disease: Secondary | ICD-10-CM | POA: Diagnosis not present

## 2016-04-09 DIAGNOSIS — N2581 Secondary hyperparathyroidism of renal origin: Secondary | ICD-10-CM | POA: Diagnosis not present

## 2016-04-11 DIAGNOSIS — D631 Anemia in chronic kidney disease: Secondary | ICD-10-CM | POA: Diagnosis not present

## 2016-04-11 DIAGNOSIS — N2581 Secondary hyperparathyroidism of renal origin: Secondary | ICD-10-CM | POA: Diagnosis not present

## 2016-04-11 DIAGNOSIS — N186 End stage renal disease: Secondary | ICD-10-CM | POA: Diagnosis not present

## 2016-04-13 DIAGNOSIS — D631 Anemia in chronic kidney disease: Secondary | ICD-10-CM | POA: Diagnosis not present

## 2016-04-13 DIAGNOSIS — N2581 Secondary hyperparathyroidism of renal origin: Secondary | ICD-10-CM | POA: Diagnosis not present

## 2016-04-13 DIAGNOSIS — N186 End stage renal disease: Secondary | ICD-10-CM | POA: Diagnosis not present

## 2016-04-13 DIAGNOSIS — E1129 Type 2 diabetes mellitus with other diabetic kidney complication: Secondary | ICD-10-CM | POA: Diagnosis not present

## 2016-04-16 DIAGNOSIS — N186 End stage renal disease: Secondary | ICD-10-CM | POA: Diagnosis not present

## 2016-04-16 DIAGNOSIS — D631 Anemia in chronic kidney disease: Secondary | ICD-10-CM | POA: Diagnosis not present

## 2016-04-16 DIAGNOSIS — E1129 Type 2 diabetes mellitus with other diabetic kidney complication: Secondary | ICD-10-CM | POA: Diagnosis not present

## 2016-04-16 DIAGNOSIS — N2581 Secondary hyperparathyroidism of renal origin: Secondary | ICD-10-CM | POA: Diagnosis not present

## 2016-04-18 DIAGNOSIS — D631 Anemia in chronic kidney disease: Secondary | ICD-10-CM | POA: Diagnosis not present

## 2016-04-18 DIAGNOSIS — E1129 Type 2 diabetes mellitus with other diabetic kidney complication: Secondary | ICD-10-CM | POA: Diagnosis not present

## 2016-04-18 DIAGNOSIS — N186 End stage renal disease: Secondary | ICD-10-CM | POA: Diagnosis not present

## 2016-04-18 DIAGNOSIS — N2581 Secondary hyperparathyroidism of renal origin: Secondary | ICD-10-CM | POA: Diagnosis not present

## 2016-04-20 DIAGNOSIS — D631 Anemia in chronic kidney disease: Secondary | ICD-10-CM | POA: Diagnosis not present

## 2016-04-20 DIAGNOSIS — N2581 Secondary hyperparathyroidism of renal origin: Secondary | ICD-10-CM | POA: Diagnosis not present

## 2016-04-20 DIAGNOSIS — E1129 Type 2 diabetes mellitus with other diabetic kidney complication: Secondary | ICD-10-CM | POA: Diagnosis not present

## 2016-04-20 DIAGNOSIS — N186 End stage renal disease: Secondary | ICD-10-CM | POA: Diagnosis not present

## 2016-04-23 DIAGNOSIS — Z992 Dependence on renal dialysis: Secondary | ICD-10-CM | POA: Diagnosis not present

## 2016-04-23 DIAGNOSIS — E1122 Type 2 diabetes mellitus with diabetic chronic kidney disease: Secondary | ICD-10-CM | POA: Diagnosis not present

## 2016-04-23 DIAGNOSIS — N186 End stage renal disease: Secondary | ICD-10-CM | POA: Diagnosis not present

## 2016-04-23 DIAGNOSIS — N2581 Secondary hyperparathyroidism of renal origin: Secondary | ICD-10-CM | POA: Diagnosis not present

## 2016-04-23 DIAGNOSIS — E1129 Type 2 diabetes mellitus with other diabetic kidney complication: Secondary | ICD-10-CM | POA: Diagnosis not present

## 2016-04-23 DIAGNOSIS — D631 Anemia in chronic kidney disease: Secondary | ICD-10-CM | POA: Diagnosis not present

## 2016-04-25 DIAGNOSIS — D631 Anemia in chronic kidney disease: Secondary | ICD-10-CM | POA: Diagnosis not present

## 2016-04-25 DIAGNOSIS — N2581 Secondary hyperparathyroidism of renal origin: Secondary | ICD-10-CM | POA: Diagnosis not present

## 2016-04-25 DIAGNOSIS — N186 End stage renal disease: Secondary | ICD-10-CM | POA: Diagnosis not present

## 2016-04-25 DIAGNOSIS — E1129 Type 2 diabetes mellitus with other diabetic kidney complication: Secondary | ICD-10-CM | POA: Diagnosis not present

## 2016-04-27 DIAGNOSIS — E1129 Type 2 diabetes mellitus with other diabetic kidney complication: Secondary | ICD-10-CM | POA: Diagnosis not present

## 2016-04-27 DIAGNOSIS — N186 End stage renal disease: Secondary | ICD-10-CM | POA: Diagnosis not present

## 2016-04-27 DIAGNOSIS — N2581 Secondary hyperparathyroidism of renal origin: Secondary | ICD-10-CM | POA: Diagnosis not present

## 2016-04-27 DIAGNOSIS — D631 Anemia in chronic kidney disease: Secondary | ICD-10-CM | POA: Diagnosis not present

## 2016-04-30 DIAGNOSIS — N2581 Secondary hyperparathyroidism of renal origin: Secondary | ICD-10-CM | POA: Diagnosis not present

## 2016-04-30 DIAGNOSIS — D631 Anemia in chronic kidney disease: Secondary | ICD-10-CM | POA: Diagnosis not present

## 2016-04-30 DIAGNOSIS — N186 End stage renal disease: Secondary | ICD-10-CM | POA: Diagnosis not present

## 2016-04-30 DIAGNOSIS — E1129 Type 2 diabetes mellitus with other diabetic kidney complication: Secondary | ICD-10-CM | POA: Diagnosis not present

## 2016-05-02 DIAGNOSIS — E1129 Type 2 diabetes mellitus with other diabetic kidney complication: Secondary | ICD-10-CM | POA: Diagnosis not present

## 2016-05-02 DIAGNOSIS — N2581 Secondary hyperparathyroidism of renal origin: Secondary | ICD-10-CM | POA: Diagnosis not present

## 2016-05-02 DIAGNOSIS — N186 End stage renal disease: Secondary | ICD-10-CM | POA: Diagnosis not present

## 2016-05-02 DIAGNOSIS — D631 Anemia in chronic kidney disease: Secondary | ICD-10-CM | POA: Diagnosis not present

## 2016-05-04 DIAGNOSIS — N186 End stage renal disease: Secondary | ICD-10-CM | POA: Diagnosis not present

## 2016-05-04 DIAGNOSIS — E1129 Type 2 diabetes mellitus with other diabetic kidney complication: Secondary | ICD-10-CM | POA: Diagnosis not present

## 2016-05-04 DIAGNOSIS — N2581 Secondary hyperparathyroidism of renal origin: Secondary | ICD-10-CM | POA: Diagnosis not present

## 2016-05-04 DIAGNOSIS — D631 Anemia in chronic kidney disease: Secondary | ICD-10-CM | POA: Diagnosis not present

## 2016-05-07 DIAGNOSIS — N186 End stage renal disease: Secondary | ICD-10-CM | POA: Diagnosis not present

## 2016-05-07 DIAGNOSIS — E1129 Type 2 diabetes mellitus with other diabetic kidney complication: Secondary | ICD-10-CM | POA: Diagnosis not present

## 2016-05-07 DIAGNOSIS — D631 Anemia in chronic kidney disease: Secondary | ICD-10-CM | POA: Diagnosis not present

## 2016-05-07 DIAGNOSIS — N2581 Secondary hyperparathyroidism of renal origin: Secondary | ICD-10-CM | POA: Diagnosis not present

## 2016-05-09 DIAGNOSIS — D631 Anemia in chronic kidney disease: Secondary | ICD-10-CM | POA: Diagnosis not present

## 2016-05-09 DIAGNOSIS — N186 End stage renal disease: Secondary | ICD-10-CM | POA: Diagnosis not present

## 2016-05-09 DIAGNOSIS — N2581 Secondary hyperparathyroidism of renal origin: Secondary | ICD-10-CM | POA: Diagnosis not present

## 2016-05-09 DIAGNOSIS — E1129 Type 2 diabetes mellitus with other diabetic kidney complication: Secondary | ICD-10-CM | POA: Diagnosis not present

## 2016-05-11 DIAGNOSIS — E1129 Type 2 diabetes mellitus with other diabetic kidney complication: Secondary | ICD-10-CM | POA: Diagnosis not present

## 2016-05-11 DIAGNOSIS — N186 End stage renal disease: Secondary | ICD-10-CM | POA: Diagnosis not present

## 2016-05-11 DIAGNOSIS — N2581 Secondary hyperparathyroidism of renal origin: Secondary | ICD-10-CM | POA: Diagnosis not present

## 2016-05-11 DIAGNOSIS — D631 Anemia in chronic kidney disease: Secondary | ICD-10-CM | POA: Diagnosis not present

## 2016-05-14 DIAGNOSIS — D631 Anemia in chronic kidney disease: Secondary | ICD-10-CM | POA: Diagnosis not present

## 2016-05-14 DIAGNOSIS — E1129 Type 2 diabetes mellitus with other diabetic kidney complication: Secondary | ICD-10-CM | POA: Diagnosis not present

## 2016-05-14 DIAGNOSIS — N2581 Secondary hyperparathyroidism of renal origin: Secondary | ICD-10-CM | POA: Diagnosis not present

## 2016-05-14 DIAGNOSIS — N186 End stage renal disease: Secondary | ICD-10-CM | POA: Diagnosis not present

## 2016-05-16 DIAGNOSIS — N2581 Secondary hyperparathyroidism of renal origin: Secondary | ICD-10-CM | POA: Diagnosis not present

## 2016-05-16 DIAGNOSIS — N186 End stage renal disease: Secondary | ICD-10-CM | POA: Diagnosis not present

## 2016-05-16 DIAGNOSIS — E1129 Type 2 diabetes mellitus with other diabetic kidney complication: Secondary | ICD-10-CM | POA: Diagnosis not present

## 2016-05-16 DIAGNOSIS — D631 Anemia in chronic kidney disease: Secondary | ICD-10-CM | POA: Diagnosis not present

## 2016-05-18 DIAGNOSIS — N2581 Secondary hyperparathyroidism of renal origin: Secondary | ICD-10-CM | POA: Diagnosis not present

## 2016-05-18 DIAGNOSIS — D631 Anemia in chronic kidney disease: Secondary | ICD-10-CM | POA: Diagnosis not present

## 2016-05-18 DIAGNOSIS — E1129 Type 2 diabetes mellitus with other diabetic kidney complication: Secondary | ICD-10-CM | POA: Diagnosis not present

## 2016-05-18 DIAGNOSIS — N186 End stage renal disease: Secondary | ICD-10-CM | POA: Diagnosis not present

## 2016-05-21 ENCOUNTER — Other Ambulatory Visit: Payer: Self-pay

## 2016-05-21 DIAGNOSIS — N2581 Secondary hyperparathyroidism of renal origin: Secondary | ICD-10-CM | POA: Diagnosis not present

## 2016-05-21 DIAGNOSIS — D631 Anemia in chronic kidney disease: Secondary | ICD-10-CM | POA: Diagnosis not present

## 2016-05-21 DIAGNOSIS — E1129 Type 2 diabetes mellitus with other diabetic kidney complication: Secondary | ICD-10-CM | POA: Diagnosis not present

## 2016-05-21 DIAGNOSIS — N186 End stage renal disease: Secondary | ICD-10-CM | POA: Diagnosis not present

## 2016-05-23 DIAGNOSIS — D631 Anemia in chronic kidney disease: Secondary | ICD-10-CM | POA: Diagnosis not present

## 2016-05-23 DIAGNOSIS — N186 End stage renal disease: Secondary | ICD-10-CM | POA: Diagnosis not present

## 2016-05-23 DIAGNOSIS — N2581 Secondary hyperparathyroidism of renal origin: Secondary | ICD-10-CM | POA: Diagnosis not present

## 2016-05-23 DIAGNOSIS — E1129 Type 2 diabetes mellitus with other diabetic kidney complication: Secondary | ICD-10-CM | POA: Diagnosis not present

## 2016-05-24 DIAGNOSIS — E1122 Type 2 diabetes mellitus with diabetic chronic kidney disease: Secondary | ICD-10-CM | POA: Diagnosis not present

## 2016-05-24 DIAGNOSIS — N186 End stage renal disease: Secondary | ICD-10-CM | POA: Diagnosis not present

## 2016-05-24 DIAGNOSIS — Z992 Dependence on renal dialysis: Secondary | ICD-10-CM | POA: Diagnosis not present

## 2016-05-25 DIAGNOSIS — D631 Anemia in chronic kidney disease: Secondary | ICD-10-CM | POA: Diagnosis not present

## 2016-05-25 DIAGNOSIS — N186 End stage renal disease: Secondary | ICD-10-CM | POA: Diagnosis not present

## 2016-05-25 DIAGNOSIS — N2581 Secondary hyperparathyroidism of renal origin: Secondary | ICD-10-CM | POA: Diagnosis not present

## 2016-05-25 DIAGNOSIS — Z23 Encounter for immunization: Secondary | ICD-10-CM | POA: Diagnosis not present

## 2016-05-25 DIAGNOSIS — E1129 Type 2 diabetes mellitus with other diabetic kidney complication: Secondary | ICD-10-CM | POA: Diagnosis not present

## 2016-05-29 DIAGNOSIS — N2581 Secondary hyperparathyroidism of renal origin: Secondary | ICD-10-CM | POA: Diagnosis not present

## 2016-05-29 DIAGNOSIS — N186 End stage renal disease: Secondary | ICD-10-CM | POA: Diagnosis not present

## 2016-05-29 DIAGNOSIS — D509 Iron deficiency anemia, unspecified: Secondary | ICD-10-CM | POA: Diagnosis not present

## 2016-05-31 ENCOUNTER — Other Ambulatory Visit: Payer: Medicare Other

## 2016-05-31 DIAGNOSIS — D509 Iron deficiency anemia, unspecified: Secondary | ICD-10-CM | POA: Diagnosis not present

## 2016-05-31 DIAGNOSIS — N186 End stage renal disease: Secondary | ICD-10-CM | POA: Diagnosis not present

## 2016-05-31 DIAGNOSIS — N2581 Secondary hyperparathyroidism of renal origin: Secondary | ICD-10-CM | POA: Diagnosis not present

## 2016-06-02 DIAGNOSIS — N2581 Secondary hyperparathyroidism of renal origin: Secondary | ICD-10-CM | POA: Diagnosis not present

## 2016-06-02 DIAGNOSIS — N186 End stage renal disease: Secondary | ICD-10-CM | POA: Diagnosis not present

## 2016-06-02 DIAGNOSIS — D509 Iron deficiency anemia, unspecified: Secondary | ICD-10-CM | POA: Diagnosis not present

## 2016-06-04 ENCOUNTER — Encounter (HOSPITAL_COMMUNITY): Payer: Self-pay | Admitting: *Deleted

## 2016-06-04 ENCOUNTER — Emergency Department (HOSPITAL_COMMUNITY): Payer: Medicare Other

## 2016-06-04 ENCOUNTER — Ambulatory Visit: Payer: Medicare Other | Admitting: Endocrinology

## 2016-06-04 ENCOUNTER — Observation Stay (HOSPITAL_COMMUNITY)
Admission: EM | Admit: 2016-06-04 | Discharge: 2016-06-06 | Disposition: A | Discharge status: Home / Self Care | Payer: Medicare Other | Attending: Internal Medicine | Admitting: Internal Medicine

## 2016-06-04 ENCOUNTER — Ambulatory Visit: Payer: Medicare Other | Admitting: Internal Medicine

## 2016-06-04 ENCOUNTER — Telehealth: Payer: Self-pay | Admitting: Internal Medicine

## 2016-06-04 ENCOUNTER — Emergency Department (HOSPITAL_BASED_OUTPATIENT_CLINIC_OR_DEPARTMENT_OTHER)
Admit: 2016-06-04 | Discharge: 2016-06-04 | Disposition: A | Discharge status: Home / Self Care | Payer: Medicare Other | Attending: Emergency Medicine | Admitting: Emergency Medicine

## 2016-06-04 DIAGNOSIS — Z7982 Long term (current) use of aspirin: Secondary | ICD-10-CM | POA: Insufficient documentation

## 2016-06-04 DIAGNOSIS — R0609 Other forms of dyspnea: Secondary | ICD-10-CM | POA: Diagnosis present

## 2016-06-04 DIAGNOSIS — I739 Peripheral vascular disease, unspecified: Secondary | ICD-10-CM | POA: Diagnosis not present

## 2016-06-04 DIAGNOSIS — Z992 Dependence on renal dialysis: Secondary | ICD-10-CM | POA: Diagnosis not present

## 2016-06-04 DIAGNOSIS — I2510 Atherosclerotic heart disease of native coronary artery without angina pectoris: Secondary | ICD-10-CM | POA: Insufficient documentation

## 2016-06-04 DIAGNOSIS — E1122 Type 2 diabetes mellitus with diabetic chronic kidney disease: Secondary | ICD-10-CM | POA: Insufficient documentation

## 2016-06-04 DIAGNOSIS — Z87891 Personal history of nicotine dependence: Secondary | ICD-10-CM | POA: Diagnosis not present

## 2016-06-04 DIAGNOSIS — D649 Anemia, unspecified: Secondary | ICD-10-CM | POA: Diagnosis not present

## 2016-06-04 DIAGNOSIS — N281 Cyst of kidney, acquired: Secondary | ICD-10-CM | POA: Diagnosis not present

## 2016-06-04 DIAGNOSIS — M79642 Pain in left hand: Secondary | ICD-10-CM | POA: Diagnosis not present

## 2016-06-04 DIAGNOSIS — E119 Type 2 diabetes mellitus without complications: Secondary | ICD-10-CM

## 2016-06-04 DIAGNOSIS — M25512 Pain in left shoulder: Secondary | ICD-10-CM | POA: Diagnosis not present

## 2016-06-04 DIAGNOSIS — R531 Weakness: Secondary | ICD-10-CM | POA: Diagnosis not present

## 2016-06-04 DIAGNOSIS — Z79899 Other long term (current) drug therapy: Secondary | ICD-10-CM | POA: Insufficient documentation

## 2016-06-04 DIAGNOSIS — R0600 Dyspnea, unspecified: Secondary | ICD-10-CM | POA: Diagnosis not present

## 2016-06-04 DIAGNOSIS — I120 Hypertensive chronic kidney disease with stage 5 chronic kidney disease or end stage renal disease: Secondary | ICD-10-CM | POA: Insufficient documentation

## 2016-06-04 DIAGNOSIS — R0602 Shortness of breath: Secondary | ICD-10-CM

## 2016-06-04 DIAGNOSIS — Z794 Long term (current) use of insulin: Secondary | ICD-10-CM | POA: Diagnosis not present

## 2016-06-04 DIAGNOSIS — R079 Chest pain, unspecified: Secondary | ICD-10-CM | POA: Diagnosis present

## 2016-06-04 DIAGNOSIS — D696 Thrombocytopenia, unspecified: Secondary | ICD-10-CM | POA: Diagnosis present

## 2016-06-04 DIAGNOSIS — N186 End stage renal disease: Secondary | ICD-10-CM

## 2016-06-04 DIAGNOSIS — IMO0001 Reserved for inherently not codable concepts without codable children: Secondary | ICD-10-CM

## 2016-06-04 DIAGNOSIS — R202 Paresthesia of skin: Secondary | ICD-10-CM | POA: Diagnosis present

## 2016-06-04 DIAGNOSIS — G4733 Obstructive sleep apnea (adult) (pediatric): Secondary | ICD-10-CM | POA: Diagnosis present

## 2016-06-04 DIAGNOSIS — M79609 Pain in unspecified limb: Secondary | ICD-10-CM | POA: Diagnosis not present

## 2016-06-04 DIAGNOSIS — E039 Hypothyroidism, unspecified: Secondary | ICD-10-CM | POA: Diagnosis not present

## 2016-06-04 DIAGNOSIS — I10 Essential (primary) hypertension: Secondary | ICD-10-CM | POA: Diagnosis present

## 2016-06-04 DIAGNOSIS — M79661 Pain in right lower leg: Secondary | ICD-10-CM | POA: Diagnosis present

## 2016-06-04 DIAGNOSIS — D539 Nutritional anemia, unspecified: Secondary | ICD-10-CM | POA: Diagnosis present

## 2016-06-04 HISTORY — DX: Atherosclerotic heart disease of native coronary artery without angina pectoris: I25.10

## 2016-06-04 HISTORY — DX: Sleep apnea, unspecified: G47.30

## 2016-06-04 HISTORY — DX: End stage renal disease: N18.6

## 2016-06-04 HISTORY — DX: Dependence on renal dialysis: Z99.2

## 2016-06-04 LAB — BASIC METABOLIC PANEL
Anion gap: 17 — ABNORMAL HIGH (ref 5–15)
BUN: 42 mg/dL — ABNORMAL HIGH (ref 6–20)
CO2: 26 mmol/L (ref 22–32)
Calcium: 8.4 mg/dL — ABNORMAL LOW (ref 8.9–10.3)
Chloride: 97 mmol/L — ABNORMAL LOW (ref 101–111)
Creatinine, Ser: 8.64 mg/dL — ABNORMAL HIGH (ref 0.61–1.24)
GFR calc Af Amer: 6 mL/min — ABNORMAL LOW (ref >60)
GFR calc non Af Amer: 5 mL/min — ABNORMAL LOW (ref >60)
Glucose, Bld: 215 mg/dL — ABNORMAL HIGH (ref 65–99)
Potassium: 4.4 mmol/L (ref 3.5–5.1)
Sodium: 140 mmol/L (ref 135–145)

## 2016-06-04 LAB — CBC
HCT: 32.3 % — ABNORMAL LOW (ref 39.0–52.0)
Hemoglobin: 10.4 g/dL — ABNORMAL LOW (ref 13.0–17.0)
MCH: 34.9 pg — ABNORMAL HIGH (ref 26.0–34.0)
MCHC: 32.2 g/dL (ref 30.0–36.0)
MCV: 108.4 fL — ABNORMAL HIGH (ref 78.0–100.0)
Platelets: 137 10*3/uL — ABNORMAL LOW (ref 150–400)
RBC: 2.98 MIL/uL — ABNORMAL LOW (ref 4.22–5.81)
RDW: 16 % — ABNORMAL HIGH (ref 11.5–15.5)
WBC: 8.8 10*3/uL (ref 4.0–10.5)

## 2016-06-04 LAB — GLUCOSE, CAPILLARY: Glucose-Capillary: 91 mg/dL (ref 65–99)

## 2016-06-04 LAB — I-STAT TROPONIN, ED: Troponin i, poc: 0.01 ng/mL (ref 0.00–0.08)

## 2016-06-04 LAB — BRAIN NATRIURETIC PEPTIDE: B Natriuretic Peptide: 249.1 pg/mL — ABNORMAL HIGH (ref 0.0–100.0)

## 2016-06-04 LAB — TROPONIN I: Troponin I: <0.03 ng/mL (ref <0.03)

## 2016-06-04 MED ORDER — NITROGLYCERIN 0.4 MG SL SUBL: 0.4000 mg | SUBLINGUAL_TABLET | SUBLINGUAL | Status: DC | PRN

## 2016-06-04 MED ORDER — LORATADINE 10 MG PO TABS
10.0000 mg | ORAL_TABLET | Freq: Every day | ORAL | Status: DC | PRN
Start: 2016-06-04 — End: 2016-06-06

## 2016-06-04 MED ORDER — ALLOPURINOL 100 MG PO TABS
100.0000 mg | ORAL_TABLET | Freq: Every day | ORAL | Status: DC
  Administered 2016-06-05 – 2016-06-06 (×2): 100 mg via ORAL
  Filled 2016-06-04 (×2): qty 1

## 2016-06-04 MED ORDER — ACETAMINOPHEN 325 MG PO TABS
650.0000 mg | ORAL_TABLET | ORAL | Status: DC | PRN
  Administered 2016-06-06: 650 mg via ORAL
  Filled 2016-06-04: qty 2

## 2016-06-04 MED ORDER — SEVELAMER CARBONATE 800 MG PO TABS: 800.0000 mg | ORAL_TABLET | ORAL | Status: DC

## 2016-06-04 MED ORDER — INSULIN ASPART 100 UNIT/ML ~~LOC~~ SOLN: 0.0000 [IU] | Freq: Three times a day (TID) | SUBCUTANEOUS | Status: DC

## 2016-06-04 MED ORDER — METRONIDAZOLE 0.75 % EX GEL
Freq: Two times a day (BID) | CUTANEOUS | Status: DC
Start: 2016-06-05 — End: 2016-06-06
  Filled 2016-06-04: qty 45

## 2016-06-04 MED ORDER — INSULIN ASPART 100 UNIT/ML ~~LOC~~ SOLN
0.0000 [IU] | Freq: Every day | SUBCUTANEOUS | Status: DC
  Administered 2016-06-05: 2 [IU] via SUBCUTANEOUS

## 2016-06-04 MED ORDER — CINACALCET HCL 30 MG PO TABS
30.0000 mg | ORAL_TABLET | Freq: Every day | ORAL | Status: DC
  Filled 2016-06-04 (×3): qty 1

## 2016-06-04 MED ORDER — SEVELAMER CARBONATE 800 MG PO TABS
1600.0000 mg | ORAL_TABLET | Freq: Two times a day (BID) | ORAL | Status: DC
  Administered 2016-06-05: 1600 mg via ORAL
  Filled 2016-06-04: qty 2

## 2016-06-04 MED ORDER — DONEPEZIL HCL 10 MG PO TABS
10.0000 mg | ORAL_TABLET | Freq: Every day | ORAL | Status: DC
  Administered 2016-06-05 (×2): 10 mg via ORAL
  Filled 2016-06-04 (×2): qty 1

## 2016-06-04 MED ORDER — ONDANSETRON HCL 4 MG/2ML IJ SOLN: 4.0000 mg | Freq: Four times a day (QID) | INTRAMUSCULAR | Status: DC | PRN

## 2016-06-04 MED ORDER — ASPIRIN 325 MG PO TABS: 650.0000 mg | ORAL_TABLET | Freq: Every day | ORAL | Status: DC

## 2016-06-04 MED ORDER — CARVEDILOL 12.5 MG PO TABS
12.5000 mg | ORAL_TABLET | Freq: Two times a day (BID) | ORAL | Status: DC
  Administered 2016-06-05: 12.5 mg via ORAL
  Filled 2016-06-04: qty 1

## 2016-06-04 MED ORDER — HEPARIN SODIUM (PORCINE) 5000 UNIT/ML IJ SOLN
5000.0000 [IU] | Freq: Three times a day (TID) | INTRAMUSCULAR | Status: DC
  Administered 2016-06-05 (×2): 5000 [IU] via SUBCUTANEOUS
  Filled 2016-06-04 (×2): qty 1

## 2016-06-04 MED ORDER — SIMVASTATIN 20 MG PO TABS
20.0000 mg | ORAL_TABLET | Freq: Every day | ORAL | Status: DC
  Administered 2016-06-05: 20 mg via ORAL
  Filled 2016-06-04: qty 1

## 2016-06-04 MED ORDER — SEVELAMER CARBONATE 800 MG PO TABS
2400.0000 mg | ORAL_TABLET | Freq: Every day | ORAL | Status: DC
  Administered 2016-06-05 – 2016-06-06 (×2): 2400 mg via ORAL
  Filled 2016-06-04: qty 3

## 2016-06-04 MED ORDER — FLUTICASONE PROPIONATE 50 MCG/ACT NA SUSP: 1.0000 | Freq: Every day | NASAL | Status: DC | PRN

## 2016-06-04 MED ORDER — INSULIN GLARGINE 100 UNIT/ML ~~LOC~~ SOLN
8.0000 [IU] | Freq: Every day | SUBCUTANEOUS | Status: DC
  Administered 2016-06-05 – 2016-06-06 (×2): 8 [IU] via SUBCUTANEOUS
  Filled 2016-06-04 (×2): qty 0.08

## 2016-06-04 MED ORDER — LEVOTHYROXINE SODIUM 50 MCG PO TABS
50.0000 ug | ORAL_TABLET | Freq: Every day | ORAL | Status: DC
  Administered 2016-06-05 – 2016-06-06 (×2): 50 ug via ORAL
  Filled 2016-06-04 (×2): qty 1

## 2016-06-04 NOTE — ED Notes (Signed)
MD at bedside. 

## 2016-06-04 NOTE — ED Notes (Signed)
Iv attempted x 1 without success.

## 2016-06-04 NOTE — Progress Notes (Addendum)
VASCULAR LAB PRELIMINARY  PRELIMINARY  PRELIMINARY  PRELIMINARY  Left upper extremity duplex completed.    Preliminary report:  Arteries appear patent throughout the lef upper extremity. There is dampened flow in the radial and ulnar arteries with no evidence of occlusion.or thrombus  Markella Dao, RVS 06/04/2016, 9:36 PM

## 2016-06-04 NOTE — Consult Note (Signed)
Referring Physician: Merrily Pew MD  Patient name: [name] MRN: [id number] DOB: 06-Jul-1941 Sex: male  REASON FOR CONSULT: possible left hand ischemia  HPI: [name] is a 75 y.o. male with several week history of intermittent numbness tingling and coolness left forearm and hand.  It has been worse over the last 48 hours.  No history of afib but did have some chest pain and shortness of breath today.  Pt states the symptoms get worse if he lays on his left side and are improved if he lifts his shoulder off the bed but never totally resolve.  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  He uses a right side basilic vein transposition fistula.   Other medical problems include ESRD, CAD, prior aortobifem about 20 years ago for occlusive disease.  As an aside pt also complains that his legs have been weaker recently and he has been falling and requests to have his legs checked to make sure his circulation to his legs is ok.  He denies claudication or rest pain.  Past Medical History:  Diagnosis Date  . Allergy   . Anemia   . Arthritis   . Coronary artery disease   . Diabetes mellitus    Type 2  . Diverticulitis   . ED (erectile dysfunction)   . 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 GERD (gastroesophageal reflux disease)    pepto   . Gout   . Hyperlipidemia   . Hypertension   . Hypothyroidism   . Pneumonia   . PVD (peripheral vascular disease) ([place])    has plastic aorta  . Renal insufficiency   . Seasonal allergies   . Thyroid disease    Past Surgical History:  Procedure Laterality Date  . aortobifemoral bypass    . AV FISTULA PLACEMENT Left 12/01/2013   Procedure: ARTERIOVENOUS (AV) FISTULA CREATION- LEFT BRACHIOCEPHALIC;  Surgeon: Angelia Mould, MD;  Location: [facility];  Service: Vascular;  Laterality: Left;  . [place] TRANSPOSITION Right 07/27/2014   Procedure: BASCILIC  VEIN TRANSPOSITION;  Surgeon: Angelia Mould, MD;  Location: [facility];  Service: Vascular;  Laterality: Right;  . BREAST SURGERY     left - granulomatous mastitis  . COLONOSCOPY    . EYE SURGERY Bilateral    cataracts  . REVISON OF ARTERIOVENOUS FISTULA Left 02/09/2014   Procedure: REVISON OF LEFT ARTERIOVENOUS FISTULA - RESECTION OF RENDUNDANT VEIN;  Surgeon: Angelia Mould, MD;  Location: [facility];  Service: Vascular;  Laterality: Left;  . SBO with lysis adhesions    . SHUNTOGRAM Left 04/19/2014   Procedure: FISTULOGRAM;  Surgeon: Angelia Mould, MD;  Location: [facility] CATH LAB;  Service: Cardiovascular;  Laterality: Left;  . UNILATERAL UPPER EXTREMEITY ANGIOGRAM N/A 07/12/2014   Procedure: UNILATERAL UPPER Anselmo Rod;  Surgeon: Angelia Mould, MD;  Location: [facility] CATH LAB;  Service: Cardiovascular;  Laterality: N/A;    Family History  Problem Relation Age of Onset  . Aneurysm Mother   . Heart disease Father   . Stroke Father   . Hypertension Father   . Diabetes Father   . Dementia Neg Hx     SOCIAL HISTORY: Social History   Social History  . Marital status: Married    Spouse name: [name] Mood  . Number of children: 1  . Years of education: 38   Occupational  History  . Retired    Social History Main Topics  . Smoking status: Former Smoker    Quit date: 09/24/1994  . Smokeless tobacco: Never Used  . Alcohol use No     Comment: Quit Oct. 1977 ("somewhat heavy")  . Drug use: No  . Sexual activity: Not on file   Other Topics Concern  . Not on file   Social History Narrative   Lives at home with wife.   Caffeine use: Drinks no soda or tea.    Drinks 1 cup coffee/week       Allergies  Allergen Reactions  . Penicillins Rash    Has patient had a PCN reaction causing immediate rash, facial/tongue/throat swelling, SOB or lightheadedness with hypotension: Yes Has patient had a PCN reaction causing severe rash involving mucus membranes or skin  necrosis: Yes Has patient had a PCN reaction that required hospitalization: No Has patient had a PCN reaction occurring within the last 10 years: No If all of the above answers are "NO", then may proceed with Cephalosporin use.     No current facility-administered medications for this encounter.    Current Outpatient Prescriptions  Medication Sig Dispense Refill  . acetaminophen (TYLENOL) 500 MG tablet Take 500 mg by mouth every 6 (six) hours as needed (for pain).     Marland Kitchen allopurinol (ZYLOPRIM) 100 MG tablet Take 100 mg by mouth daily.      Marland Kitchen aspirin 325 MG tablet Take 650 mg by mouth daily.     . Blood Glucose Monitoring Suppl (FREESTYLE FREEDOM LITE) w/Device KIT Use to check blood sugar 2 times per day dx code E11.65 1 each 0  . carvedilol (COREG) 12.5 MG tablet Take 12.5 mg by mouth 2 (two) times daily with a meal.  0  . clobetasol (TEMOVATE) 0.05 % external solution apply to affected area ON THE SCALP UP TO 2 TIMES A DAY AS NEEDED...  (REFER TO PRESCRIPTION NOTES).  0  . donepezil (ARICEPT) 10 MG tablet Take 1 tablet (10 mg total) by mouth at bedtime. 30 tablet 12  . FLUOCINOLONE ACETONIDE SCALP 0.01 % OIL apply to affected area ON SCALP EVERY NIGHT AT BEDTIME, WASH UPON AWAKENING  0  . fluticasone (CUTIVATE) 0.05 % cream apply to affected area ON FACE UP TO 2 TIMES A DAY AS NEEDED  0  . fluticasone (FLONASE) 50 MCG/ACT nasal spray Place 1 spray into both nostrils daily. (Patient taking differently: Place 1 spray into both nostrils daily as needed for allergies. ) 16 g 2  . glucose blood (ONETOUCH VERIO) test strip Use to check blood sugar 2 times per day. Dx code E11.65 100 each 2  . insulin aspart (NOVOLOG) 100 UNIT/ML injection Inject 10 Units into the skin 3 (three) times daily before meals. (Patient taking differently: Inject 5-8 Units into the skin See admin instructions. Two to three times a day before meals) 20 mL 2  . insulin glargine (LANTUS) 100 UNIT/ML injection Inject 0.1 mLs  (10 Units total) into the skin daily. (Patient taking differently: Inject 8 Units into the skin every morning. ) 10 mL 1  . ketoconazole (NIZORAL) 2 % shampoo Apply 1 application topically 2 (two) times a week. 120 mL 0  . Lancets (FREESTYLE) lancets Use as instructed to check blood sugar 2 times per day dx code E11.65 100 each 3  . levothyroxine (SYNTHROID, LEVOTHROID) 50 MCG tablet Take 1 tablet (50 mcg total) by mouth daily. 90 tablet 1  . lidocaine-prilocaine (EMLA)  cream Apply 1 application topically once. AS DIRECTED  1  . loratadine (CLARITIN) 10 MG tablet Take 10 mg by mouth daily as needed for allergies.     . metroNIDAZOLE (METROGEL) 0.75 % gel APPLY TO AFFECTED AREA ON SKIN  ON FACE TWICE DAILY  0  . mupirocin ointment (BACTROBAN) 2 % Use as directed to affected areas 22 g prn  . Naftifine HCl (NAFTIN) 2 % CREA Apply to feet daily for fungal infection 60 g prn  . pioglitazone (ACTOS) 30 MG tablet Take 1 tablet (30 mg total) by mouth daily. 30 tablet 3  . RENVELA 800 MG tablet Take 800-2,400 mg by mouth See admin instructions. 1,600-2,400 mg with breakfast then 1,600 mg with lunch then 2,400 mg with dinner (evening meal) and 800 mg with each snack  0  . SENSIPAR 30 MG tablet Take 30 mg by mouth daily.   1  . simvastatin (ZOCOR) 20 MG tablet Take 20 mg by mouth at bedtime.     . triamcinolone ointment (KENALOG) 0.1 % Apply 1 application topically 2 (two) times daily. Use between thighs    . atorvastatin (LIPITOR) 40 MG tablet Take 1 tablet (40 mg total) by mouth daily. (Patient not taking: Reported on 06/04/2016) 30 tablet 0    ROS:   General:  No weight loss, Fever, chills  HEENT: No recent headaches, no nasal bleeding, no visual changes, no sore throat  Neurologic: No dizziness, blackouts, seizures. No recent symptoms of stroke or mini- stroke. No recent episodes of slurred speech, or temporary blindness.  Cardiac: + recent episodes of chest pain/pressure, no shortness of breath at  rest.  + shortness of breath with exertion.  Denies history of atrial fibrillation or irregular heartbeat  Vascular: No history of rest pain in feet.  No history of claudication.  No history of non-healing ulcer, No history of DVT   Pulmonary: No home oxygen, no productive cough, no hemoptysis,  No asthma or wheezing  Musculoskeletal:  _0  Arthritis, _1  Low back pain,  _2  Joint pain  Hematologic:No history of hypercoagulable state.  No history of easy bleeding.  No history of anemia  Gastrointestinal: No hematochezia or melena,  No gastroesophageal reflux, no trouble swallowing  Urinary: _3  chronic Kidney disease, _4  on HD - _5  MWF or _6  TTHS, _7  Burning with urination, _8  Frequent urination, _9  Difficulty urinating;   Skin: No rashes  Psychological: No history of anxiety,  No history of depression   Physical Examination  Vitals:   06/04/16 1752 06/04/16 1800 06/04/16 1816 06/04/16 1930  BP: 120/88 (!) 101/54 (!) 75/55 (!) 60/53  Pulse: 68 61 60 69  Resp: _10 (!) 33  Temp:      TempSrc:      SpO2: 100% 100% 100% 93%  Weight:      Height:        Body mass index is 28.45 kg/m.  General:  Alert and oriented, no acute distress HEENT: Normal Neck: No JVD Pulmonary: Clear to auscultation bilaterally Cardiac: Regular Rate and Rhythm  Abdomen: Soft, non-tender, non-distended, no mass Skin: No rash or ulcers Extremity Pulses:  2+ brachial,2+ right femoral, 1+ left femoral absent radial, dorsalis pedis, posterior tibial pulses bilaterally, pt has pulsatile left upper arm fistula, + thrill right arm av fistula, both hands cool ? Left subtle more than right, has radial doppler flow ulnar flow to left hand, no change in flow with compression of fistula  Musculoskeletal: No deformity or edema  Neurologic: Upper and lower extremity motor 5/5 and symmetric  DATA:  Arterial duplex left arm pending  CBC    Component Value Date/Time   WBC 8.8 06/04/2016 1248   RBC 2.98  (L) 06/04/2016 1248   HGB 10.4 (L) 06/04/2016 1248   HCT 32.3 (L) 06/04/2016 1248   PLT 137 (L) 06/04/2016 1248   MCV 108.4 (H) 06/04/2016 1248   MCV 104.2 (A) 05/31/2014 0934   MCH 34.9 (H) 06/04/2016 1248   MCHC 32.2 06/04/2016 1248   RDW 16.0 (H) 06/04/2016 1248   LYMPHSABS 2.5 03/31/2015 1219   MONOABS 0.9 03/31/2015 1219   EOSABS 0.3 03/31/2015 1219   BASOSABS 0.0 03/31/2015 1219    BMET    Component Value Date/Time   NA 140 06/04/2016 1248   K 4.4 06/04/2016 1248   CL 97 (L) 06/04/2016 1248   CO2 26 06/04/2016 1248   GLUCOSE 215 (H) 06/04/2016 1248   BUN 42 (H) 06/04/2016 1248   CREATININE 8.64 (H) 06/04/2016 1248   CREATININE 5.03 (H) 01/10/2016 1251   CALCIUM 8.4 (L) 06/04/2016 1248   GFRNONAA 5 (L) 06/04/2016 1248   GFRNONAA 10 (L) 01/10/2016 1251   GFRAA 6 (L) 06/04/2016 1248   GFRAA 12 (L) 01/10/2016 1251   Troponin negative BNP mildly elevated  ASSESSMENT:  Left hand pain.  Story not really consistent with acute ischemia especially in light of no arrhythmia.  May have element of steal from left arm AV fistula but again unlikely secondary to central venous occlusion preventing high flow up fistula   PLAN:  Left upper extremity duplex to rule out thrombus brachial radial ulnar arteries Heparin drip if positive and consider embolectomy.  Otherwise will see in follow up as outpatient to consider ligation of fistula.  Potentially this is nerve compression related and could be worked up further if vascular work up negative  Will also order ABIs to assess current status of his legs although again this is not an acute problem and can be worked up as outpt.   Ruta Hinds, MD [facility] Office: [phone] Pager: [phone]

## 2016-06-04 NOTE — Telephone Encounter (Signed)
Patient called this morning wanting to be seen today for complaints of cough/aching.  Patient states he's not sure if he may have pneumonia.  Offered to work patient in this morning.  Patient states that he has dialysis today.  Advised that we will see him when he gets off of dialysis this evening at 4:15.    Patient called back at 11:45 and states that he's going to have his wife take him to the hospital.  Wants me to get a message to Dr. Renold Genta that he's going to the hospital.    Patient denied having fever, chills this morning when he first called.  States that he had just felt tired, run down and had this cough and aching in his body for a while and he wasn't sure what was wrong but he needed to be seen.  Advised that I would make sure Dr. Renold Genta was aware.

## 2016-06-04 NOTE — ED Provider Notes (Signed)
[facility] DEPT Provider Note   CSN: [id number] Arrival date & time: 06/04/16  1218     History   Chief Complaint Chief Complaint  Patient presents with  . Shortness of Breath  . Arm Pain    HPI [name] is a 75 y.o. male.   Shortness of Breath  This is a new problem. The average episode lasts 2 weeks. The problem occurs continuously.The current episode started more than 1 week ago. The problem has been gradually worsening. Pertinent negatives include no fever, no neck pain, no cough, no chest pain, no abdominal pain and no leg swelling.    Past Medical History:  Diagnosis Date  . Allergy   . Anemia   . Arthritis   . Coronary artery disease   . Diabetes mellitus    Type 2  . Diverticulitis   . ED (erectile dysfunction)   . Elevated homocysteine ([place])   . GERD (gastroesophageal reflux disease)    pepto   . Gout   . Hyperlipidemia   . Hypertension   . Hypothyroidism   . Pneumonia   . PVD (peripheral vascular disease) ([place])    has plastic aorta  . Renal insufficiency   . Seasonal allergies   . Thyroid disease     Patient Active Problem List   Diagnosis Date Noted  . Mild cognitive impairment with memory loss 01/12/2016  . ESRD on dialysis ([place]) 05/17/2015  . Hypoglycemia   . Weakness 02/18/2015  . OSA (obstructive sleep apnea) 02/02/2015  . Hyperkalemia 10/05/2014  . Chronic kidney disease (CKD), stage IV (severe) ([place]) 04/07/2014  . End stage renal disease ([place]) 11/18/2013  . BPH (benign prostatic hyperplasia) 11/22/2012  . Peripheral vascular disease ([place]) 12/24/2011  . Hyperparathyroidism 12/24/2011  . Hypothyroidism 12/24/2011  . Allergic rhinitis 12/24/2011  . Erectile dysfunction 12/24/2011  . Diabetes mellitus type 2 with complications ([place]) 99/35/7017  . HYPERLIPIDEMIA 09/03/2008  . Essential hypertension 08/30/2008  . PANCREATITIS, HX OF 08/30/2008  . RENAL FAILURE, ACUTE, HX OF 08/30/2008  . DIVERTICULOSIS, COLON 06/08/2003     Past Surgical History:  Procedure Laterality Date  . aortobifemoral bypass    . AV FISTULA PLACEMENT Left 12/01/2013   Procedure: ARTERIOVENOUS (AV) FISTULA CREATION- LEFT BRACHIOCEPHALIC;  Surgeon: Angelia Mould, MD;  Location: [facility];  Service: Vascular;  Laterality: Left;  . [place] TRANSPOSITION Right 07/27/2014   Procedure: BASCILIC VEIN TRANSPOSITION;  Surgeon: Angelia Mould, MD;  Location: [facility];  Service: Vascular;  Laterality: Right;  . BREAST SURGERY     left - granulomatous mastitis  . COLONOSCOPY    . EYE SURGERY Bilateral    cataracts  . REVISON OF ARTERIOVENOUS FISTULA Left 02/09/2014   Procedure: REVISON OF LEFT ARTERIOVENOUS FISTULA - RESECTION OF RENDUNDANT VEIN;  Surgeon: Angelia Mould, MD;  Location: [facility];  Service: Vascular;  Laterality: Left;  . SBO with lysis adhesions    . SHUNTOGRAM Left 04/19/2014   Procedure: FISTULOGRAM;  Surgeon: Angelia Mould, MD;  Location: [facility] CATH LAB;  Service: Cardiovascular;  Laterality: Left;  . UNILATERAL UPPER EXTREMEITY ANGIOGRAM N/A 07/12/2014   Procedure: UNILATERAL UPPER Anselmo Rod;  Surgeon: Angelia Mould, MD;  Location: [facility] CATH LAB;  Service: Cardiovascular;  Laterality: N/A;       Home Medications    Prior to Admission medications   Medication Sig Start Date End Date Taking? Authorizing Provider  acetaminophen (TYLENOL) 500 MG tablet Take 500 mg by mouth every 6 (six)  hours as needed.    Historical Provider, MD  allopurinol (ZYLOPRIM) 100 MG tablet Take 100 mg by mouth daily.      Historical Provider, MD  aspirin 325 MG tablet Take 650 mg by mouth daily.     Historical Provider, MD  atorvastatin (LIPITOR) 40 MG tablet Take 1 tablet (40 mg total) by mouth daily. 10/14/15   Elby Showers, MD  Blood Glucose Monitoring Suppl (FREESTYLE FREEDOM LITE) w/Device KIT Use to check blood sugar 2 times per day dx code E11.65 03/08/16   Elayne Snare, MD  carvedilol (COREG) 12.5 MG  tablet Take 12.5 mg by mouth 2 (two) times daily with a meal. 03/25/15   Historical Provider, MD  clobetasol (TEMOVATE) 0.05 % external solution apply to affected area ON THE SCALP UP TO 2 TIMES A DAY AS NEEDED...  (REFER TO PRESCRIPTION NOTES). 04/19/15   Historical Provider, MD  donepezil (ARICEPT) 10 MG tablet Take 1 tablet (10 mg total) by mouth at bedtime. 01/12/16   Melvenia Beam, MD  FLUOCINOLONE ACETONIDE SCALP 0.01 % OIL apply to affected area ON SCALP EVERY NIGHT AT BEDTIME, [facility] UPON AWAKENING 04/28/15   Historical Provider, MD  fluticasone (CUTIVATE) 0.05 % cream apply to affected area ON FACE UP TO 2 TIMES A DAY AS NEEDED 04/19/15   Historical Provider, MD  fluticasone (FLONASE) 50 MCG/ACT nasal spray Place 1 spray into both nostrils daily. Patient taking differently: Place 1 spray into both nostrils daily as needed for allergies.  10/08/14   Maryann Mikhail, DO  glucose blood (ONETOUCH VERIO) test strip Use to check blood sugar 2 times per day. Dx code E11.65 03/01/16   Elayne Snare, MD  insulin aspart (NOVOLOG) 100 UNIT/ML injection Inject 10 Units into the skin 3 (three) times daily before meals. 01/20/16   Elayne Snare, MD  insulin glargine (LANTUS) 100 UNIT/ML injection Inject 0.1 mLs (10 Units total) into the skin daily. 05/25/15   Elayne Snare, MD  ketoconazole (NIZORAL) 2 % shampoo Apply 1 application topically 2 (two) times a week. 03/31/15   Elby Showers, MD  Lancets (FREESTYLE) lancets Use as instructed to check blood sugar 2 times per day dx code E11.65 02/22/16   Elayne Snare, MD  levothyroxine (SYNTHROID, LEVOTHROID) 50 MCG tablet Take 1 tablet (50 mcg total) by mouth daily. 08/06/14   Elby Showers, MD  lidocaine-prilocaine (EMLA) cream  03/29/15   Historical Provider, MD  loratadine (CLARITIN) 10 MG tablet Take 10 mg by mouth daily as needed for allergies.     Historical Provider, MD  metroNIDAZOLE (METROGEL) 0.75 % gel APPLY TO AFFECTED AREA ON SKIN  ON FACE TWICE DAILY 06/14/15   Historical  Provider, MD  mupirocin ointment (BACTROBAN) 2 % Use as directed to affected areas 03/23/15   Elby Showers, MD  Naftifine HCl (NAFTIN) 2 % CREA Apply to feet daily for fungal infection 09/12/15   Elby Showers, MD  pioglitazone (ACTOS) 30 MG tablet Take 1 tablet (30 mg total) by mouth daily. 05/17/15   Elayne Snare, MD  RENVELA 800 MG tablet Take 800 mg by mouth 3 (three) times daily with meals. Takes 3  Tablets with each meal and 1 tablet with snack 07/27/14   Historical Provider, MD  SENSIPAR 30 MG tablet  12/01/15   Historical Provider, MD  simvastatin (ZOCOR) 20 MG tablet Take 10 mg by mouth at bedtime.     Historical Provider, MD  triamcinolone ointment (KENALOG) 0.1 % Apply 1  application topically 2 (two) times daily. Use between thighs    Historical Provider, MD    Family History Family History  Problem Relation Age of Onset  . Aneurysm Mother   . Heart disease Father   . Stroke Father   . Hypertension Father   . Diabetes Father   . Dementia Neg Hx     Social History Social History  Substance Use Topics  . Smoking status: Former Smoker    Quit date: 09/24/1994  . Smokeless tobacco: Never Used  . Alcohol use No     Comment: Quit Oct. 1977 ("somewhat heavy")     Allergies   Penicillins   Review of Systems Review of Systems  Constitutional: Positive for fatigue and unexpected weight change (3-4 pound gain in the last 2 months). Negative for fever.  Respiratory: Positive for shortness of breath (worse with exertion). Negative for cough and chest tightness.   Cardiovascular: Negative for chest pain, palpitations and leg swelling.  Gastrointestinal: Negative for abdominal pain and nausea.  Genitourinary: Negative for flank pain.  Musculoskeletal: Negative for arthralgias and neck pain.  Psychiatric/Behavioral: Negative for agitation and confusion.  All other systems reviewed and are negative.    Physical Exam Updated Vital Signs BP 110/89 (BP Location: Right Arm)   Pulse  85   Temp 97.8 F (36.6 C) (Oral)   Resp 17   Ht 5' 11"  (1.803 m)   Wt 204 lb (92.5 kg)   SpO2 99%   BMI 28.45 kg/m   Physical Exam  Constitutional: He appears well-developed and well-nourished.  HENT:  Head: Normocephalic and atraumatic.  Eyes: Conjunctivae are normal.  Neck: Neck supple.  Cardiovascular: Normal rate and regular rhythm.   No murmur heard. No palpable pulse on left wrist, decreased cap refill when compared to right hand. Left hand colder than right as well.  Left arm fistula with palpable pulse and thrill.   Pulmonary/Chest: Effort normal. No respiratory distress. He has rales.  Abdominal: Soft. There is no tenderness.  Musculoskeletal: He exhibits no edema.  Neurological: He is alert.  Skin: Skin is warm and dry.  Psychiatric: He has a normal mood and affect.  Nursing note and vitals reviewed.    ED Treatments / Results  Labs (all labs ordered are listed, but only abnormal results are displayed) Labs Reviewed  BASIC METABOLIC PANEL - Abnormal; Notable for the following:       Result Value   Chloride 97 (*)    Glucose, Bld 215 (*)    BUN 42 (*)    Creatinine, Ser 8.64 (*)    Calcium 8.4 (*)    GFR calc non Af Amer 5 (*)    GFR calc Af Amer 6 (*)    Anion gap 17 (*)    All other components within normal limits  CBC - Abnormal; Notable for the following:    RBC 2.98 (*)    Hemoglobin 10.4 (*)    HCT 32.3 (*)    MCV 108.4 (*)    MCH 34.9 (*)    RDW 16.0 (*)    Platelets 137 (*)    All other components within normal limits  I-STAT TROPOININ, ED    EKG  EKG Interpretation None       Radiology Dg Chest 2 View  Result Date: 06/04/2016 CLINICAL DATA:  75 year old male with shortness of breath, central chest pressure for 1 week. Congestion. Initial encounter. Former smoker EXAM: CHEST  2 VIEW COMPARISON:  02/18/2016 and earlier.  FINDINGS: Calcified aortic atherosclerosis. Normal cardiac size and mediastinal contours. Visualized tracheal air  column is within normal limits. Stable lung volumes, at the upper limits of normal. Mild chronic increased interstitial markings in both lungs appear stable. No pneumothorax, pulmonary edema, pleural effusion or acute pulmonary opacity. Stable epigastric surgical clips. Osteopenia. No acute osseous abnormality identified. IMPRESSION: 1. No acute cardiopulmonary abnormality. Chronic interstitial pulmonary changes suspected. 2.  Calcified aortic atherosclerosis. Electronically Signed   By: Genevie Ann M.D.   On: 06/04/2016 13:31    Procedures Procedures (including critical care time)  Medications Ordered in ED Medications - No data to display   Initial Impression / Assessment and Plan / ED Course  I have reviewed the triage vital signs and the nursing notes.  Pertinent labs & imaging results that were available during my care of the patient were reviewed by me and considered in my medical decision making (see chart for details).  Clinical Course    75 yo M w/ questionable arterial occlusion, vascular consulted. Also with dyspnea of unknown origin, likely fluid overload from missing dialysis, will likely need admission to medicine for same.   Final Clinical Impressions(s) / ED Diagnoses   Final diagnoses:  Dyspnea    New Prescriptions New Prescriptions   No medications on file     Merrily Pew, MD 06/05/16 [phone]

## 2016-06-04 NOTE — ED Triage Notes (Signed)
Pt states increasing sob with activity for several weeks that markedly increased this am. States L arm numbness while laying on back last night.  This am was very sob with  Any activity.  Last dialysis on Sat.

## 2016-06-04 NOTE — ED Notes (Signed)
Dr. Dayna Barker aware of VS.

## 2016-06-04 NOTE — H&P (Signed)
History and Physical    [name] JWJ:[id number] DOB: Jan 17, 1941 DOA: 06/04/2016  PCP: Elby Showers, MD   Patient coming from: Home   Chief Complaint: Dyspnea, left arm numb and cold   HPI: [name] is a 75 y.o. male with medical history significant for end-stage renal disease on hemodialysis, insulin-dependent diabetes mellitus, hypertension, and coronary artery disease 2% emergency department with progressive dyspnea on exertion and paresthesia, pain, and coolness to the left upper extremity. Patient reports that he had been in his usual state of health until the insidious development of dyspnea with exertion approximately 2 weeks ago. There is been no chest pain or palpitations associated with this and the patient denies any significant cough. He has been dyspneic with minimal exertion, worsening since its onset 2 weeks ago, with the patient reporting severe dyspnea while brushing his teeth today. Over this same interval, he has noted intermittent numbness, pain, and coldness involving the left forearm and hand. He had a arteriovenous fistula placed in the left arm for dialysis access previously. He feels that the left arm  symptoms are positional, occurring when he lies on his back, and resolving when he rolls onto his side. Patient takes 650 mg aspirin daily. He denies any personal or family history of VTE. He denies any significant lower extremity edema, but notes some tenderness in the right calf. Patient missed his regularly scheduled dialysis session today.  ED Course: Upon arrival to the ED, patient is found to be be afebrile, saturating adequately on room air, and with vital signs stable. EKG demonstrates a sinus rhythm with unusual P-axis, LAD, and a pulmonary disease pattern. Chest x-ray is negative for acute cardiopulmonary disease and chemistry panel features a serum glucose of 2:15, BUN 42, and creatinine of 8.64. CBC is notable for a stable anemia with hemoglobin 10.4 and  stable thrombocytopenia with platelet count of 137,000, but MCV has worsened to 108.4. Troponin is reassuring at 0.01 and BNP was elevated to 249. Second troponin was obtained 5 hours later and is undetectable. Vascular surgery was consulted by the ED physician as there was concern for possible left arm ischemia. Acute arm ischemia is not suspected by the surgeon and a vascular ultrasound has been ordered for further evaluation. Patient has remained hemodynamically stable in the emergency department, though some spurious blood pressure readings are documented, and will be observed in the telemetry unit for ongoing evaluation and management of exertional dyspnea concerning for possible angina or PE.  Review of Systems:  All other systems reviewed and apart from HPI, are negative.  Past Medical History:  Diagnosis Date  . Allergy   . Anemia   . Arthritis   . Coronary artery disease   . Diabetes mellitus    Type 2  . Diverticulitis   . ED (erectile dysfunction)   . 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 GERD (gastroesophageal reflux disease)    pepto   . Gout   . Hyperlipidemia   . Hypertension   . Hypothyroidism   . Pneumonia   . PVD (peripheral vascular disease) ([place])    has plastic aorta  . Renal insufficiency   . Seasonal allergies   . Thyroid disease     Past Surgical History:  Procedure Laterality Date  . aortobifemoral bypass    . AV FISTULA PLACEMENT Left 12/01/2013   Procedure: ARTERIOVENOUS (AV) FISTULA CREATION- LEFT BRACHIOCEPHALIC;  Surgeon: Angelia Mould, MD;  Location: [place];  Service: Vascular;  Laterality: Left;  .  [place] TRANSPOSITION Right 07/27/2014   Procedure: BASCILIC VEIN TRANSPOSITION;  Surgeon: Angelia Mould, MD;  Location: [place];  Service: Vascular;  Laterality: Right;  . BREAST SURGERY     left - granulomatous mastitis  . COLONOSCOPY    . EYE SURGERY Bilateral    cataracts  . REVISON OF ARTERIOVENOUS FISTULA Left 02/09/2014   Procedure:  REVISON OF LEFT ARTERIOVENOUS FISTULA - RESECTION OF RENDUNDANT VEIN;  Surgeon: Angelia Mould, MD;  Location: [place];  Service: Vascular;  Laterality: Left;  . SBO with lysis adhesions    . SHUNTOGRAM Left 04/19/2014   Procedure: FISTULOGRAM;  Surgeon: Angelia Mould, MD;  Location: [facility] CATH LAB;  Service: Cardiovascular;  Laterality: Left;  . UNILATERAL UPPER EXTREMEITY ANGIOGRAM N/A 07/12/2014   Procedure: UNILATERAL UPPER Anselmo Rod;  Surgeon: Angelia Mould, MD;  Location: [facility] CATH LAB;  Service: Cardiovascular;  Laterality: N/A;     reports that he quit smoking about 21 years ago. He has never used smokeless tobacco. He reports that he does not drink alcohol or use drugs.  Allergies  Allergen Reactions  . Penicillins Rash    Has patient had a PCN reaction causing immediate rash, facial/tongue/throat swelling, SOB or lightheadedness with hypotension: Yes Has patient had a PCN reaction causing severe rash involving mucus membranes or skin necrosis: Yes Has patient had a PCN reaction that required hospitalization: No Has patient had a PCN reaction occurring within the last 10 years: No If all of the above answers are "NO", then may proceed with Cephalosporin use.     Family History  Problem Relation Age of Onset  . Aneurysm Mother   . Heart disease Father   . Stroke Father   . Hypertension Father   . Diabetes Father   . Dementia Neg Hx      Prior to Admission medications   Medication Sig Start Date End Date Taking? Authorizing Provider  acetaminophen (TYLENOL) 500 MG tablet Take 500 mg by mouth every 6 (six) hours as needed (for pain).    Yes Historical Provider, MD  allopurinol (ZYLOPRIM) 100 MG tablet Take 100 mg by mouth daily.     Yes Historical Provider, MD  aspirin 325 MG tablet Take 650 mg by mouth daily.    Yes Historical Provider, MD  Blood Glucose Monitoring Suppl (FREESTYLE FREEDOM LITE) w/Device KIT Use to check blood sugar 2 times per  day dx code E11.65 03/08/16  Yes Elayne Snare, MD  carvedilol (COREG) 12.5 MG tablet Take 12.5 mg by mouth 2 (two) times daily with a meal. 03/25/15  Yes Historical Provider, MD  clobetasol (TEMOVATE) 0.05 % external solution apply to affected area ON THE SCALP UP TO 2 TIMES A DAY AS NEEDED...  (REFER TO PRESCRIPTION NOTES). 04/19/15  Yes Historical Provider, MD  donepezil (ARICEPT) 10 MG tablet Take 1 tablet (10 mg total) by mouth at bedtime. 01/12/16  Yes Melvenia Beam, MD  FLUOCINOLONE ACETONIDE SCALP 0.01 % OIL apply to affected area ON SCALP EVERY NIGHT AT BEDTIME, [place] UPON AWAKENING 04/28/15  Yes Historical Provider, MD  fluticasone (CUTIVATE) 0.05 % cream apply to affected area ON FACE UP TO 2 TIMES A DAY AS NEEDED 04/19/15  Yes Historical Provider, MD  fluticasone (FLONASE) 50 MCG/ACT nasal spray Place 1 spray into both nostrils daily. Patient taking differently: Place 1 spray into both nostrils daily as needed for allergies.  10/08/14  Yes Maryann Mikhail, DO  glucose blood (ONETOUCH VERIO) test strip Use  to check blood sugar 2 times per day. Dx code E11.65 03/01/16  Yes Elayne Snare, MD  insulin aspart (NOVOLOG) 100 UNIT/ML injection Inject 10 Units into the skin 3 (three) times daily before meals. Patient taking differently: Inject 5-8 Units into the skin See admin instructions. Two to three times a day before meals 01/20/16  Yes Elayne Snare, MD  insulin glargine (LANTUS) 100 UNIT/ML injection Inject 0.1 mLs (10 Units total) into the skin daily. Patient taking differently: Inject 8 Units into the skin every morning.  05/25/15  Yes Elayne Snare, MD  ketoconazole (NIZORAL) 2 % shampoo Apply 1 application topically 2 (two) times a week. 03/31/15  Yes Elby Showers, MD  Lancets (FREESTYLE) lancets Use as instructed to check blood sugar 2 times per day dx code E11.65 02/22/16  Yes Elayne Snare, MD  levothyroxine (SYNTHROID, LEVOTHROID) 50 MCG tablet Take 1 tablet (50 mcg total) by mouth daily. 08/06/14  Yes Elby Showers, MD  lidocaine-prilocaine (EMLA) cream Apply 1 application topically once. AS DIRECTED 03/29/15  Yes Historical Provider, MD  loratadine (CLARITIN) 10 MG tablet Take 10 mg by mouth daily as needed for allergies.    Yes Historical Provider, MD  metroNIDAZOLE (METROGEL) 0.75 % gel APPLY TO AFFECTED AREA ON SKIN  ON FACE TWICE DAILY 06/14/15  Yes Historical Provider, MD  mupirocin ointment (BACTROBAN) 2 % Use as directed to affected areas 03/23/15  Yes Elby Showers, MD  Naftifine HCl (NAFTIN) 2 % CREA Apply to feet daily for fungal infection 09/12/15  Yes Elby Showers, MD  pioglitazone (ACTOS) 30 MG tablet Take 1 tablet (30 mg total) by mouth daily. 05/17/15  Yes Elayne Snare, MD  RENVELA 800 MG tablet Take 800-2,400 mg by mouth See admin instructions. 1,600-2,400 mg with breakfast then 1,600 mg with lunch then 2,400 mg with dinner (evening meal) and 800 mg with each snack 07/27/14  Yes Historical Provider, MD  SENSIPAR 30 MG tablet Take 30 mg by mouth daily.  12/01/15  Yes Historical Provider, MD  simvastatin (ZOCOR) 20 MG tablet Take 20 mg by mouth at bedtime.    Yes Historical Provider, MD  triamcinolone ointment (KENALOG) 0.1 % Apply 1 application topically 2 (two) times daily. Use between thighs   Yes Historical Provider, MD  atorvastatin (LIPITOR) 40 MG tablet Take 1 tablet (40 mg total) by mouth daily. Patient not taking: Reported on 06/04/2016 10/14/15   Elby Showers, MD    Physical Exam: Vitals:   06/04/16 1800 06/04/16 1816 06/04/16 1930 06/04/16 2200  BP: (!) 101/54 (!) 75/55 (!) 60/53 98/55  Pulse: 61 60 69 64  Resp: 13 20 (!) 33 21  Temp:      TempSrc:      SpO2: 100% 100% 93% 96%  Weight:      Height:          Constitutional: NAD, calm, comfortable Eyes: PERTLA, lids and conjunctivae normal ENMT: Mucous membranes are moist. Posterior pharynx clear of any exudate or lesions.   Neck: normal, supple, no masses, no thyromegaly Respiratory: clear to auscultation bilaterally, no  wheezing, no crackles. Normal respiratory effort.    Cardiovascular: S1 & S2 heard, regular rate and rhythm, soft systolic murmur at apex. No carotid bruits. No significant JVD. Left radial and ulnar pulses difficult to palpate.  Abdomen: No distension, no tenderness, no masses palpated. Bowel sounds normal.  Musculoskeletal: no clubbing / cyanosis. Right calf tender and slightly larger than left. Normal muscle tone.  Skin: no significant rashes, lesions, ulcers. Warm, dry, well-perfused. Neurologic: CN 2-12 grossly intact. Sensation intact, DTR normal. Strength 5/5 in all 4 limbs.  Psychiatric: Normal judgment and insight. Alert and oriented x 3. Normal mood and affect.     Labs on Admission: I have personally reviewed following labs and imaging studies  CBC:  Recent Labs Lab 06/04/16 1248  WBC 8.8  HGB 10.4*  HCT 32.3*  MCV 108.4*  PLT 226*   Basic Metabolic Panel:  Recent Labs Lab 06/04/16 1248  NA 140  K 4.4  CL 97*  CO2 26  GLUCOSE 215*  BUN 42*  CREATININE 8.64*  CALCIUM 8.4*   GFR: Estimated Creatinine Clearance: 8.7 mL/min (by C-G formula based on SCr of 8.64 mg/dL). Liver Function Tests: No results for input(s): AST, ALT, ALKPHOS, BILITOT, PROT, ALBUMIN in the last 168 hours. No results for input(s): LIPASE, AMYLASE in the last 168 hours. No results for input(s): AMMONIA in the last 168 hours. Coagulation Profile: No results for input(s): INR, PROTIME in the last 168 hours. Cardiac Enzymes:  Recent Labs Lab 06/04/16 1832  TROPONINI <0.03   BNP (last 3 results) No results for input(s): PROBNP in the last 8760 hours. HbA1C: No results for input(s): HGBA1C in the last 72 hours. CBG: No results for input(s): GLUCAP in the last 168 hours. Lipid Profile: No results for input(s): CHOL, HDL, LDLCALC, TRIG, CHOLHDL, LDLDIRECT in the last 72 hours. Thyroid Function Tests: No results for input(s): TSH, T4TOTAL, FREET4, T3FREE, THYROIDAB in the last 72  hours. Anemia Panel: No results for input(s): VITAMINB12, FOLATE, FERRITIN, TIBC, IRON, RETICCTPCT in the last 72 hours. Urine analysis:    Component Value Date/Time   COLORURINE YELLOW 02/20/2015 0944   APPEARANCEUR CLEAR 02/20/2015 0944   LABSPEC 1.008 02/20/2015 0944   PHURINE 5.5 02/20/2015 0944   GLUCOSEU NEGATIVE 02/20/2015 0944   HGBUR SMALL (A) 02/20/2015 0944   BILIRUBINUR NEGATIVE 02/20/2015 0944   BILIRUBINUR neg 02/14/2015 1115   KETONESUR NEGATIVE 02/20/2015 0944   PROTEINUR 100 (A) 02/20/2015 0944   UROBILINOGEN 0.2 02/20/2015 0944   NITRITE NEGATIVE 02/20/2015 0944   LEUKOCYTESUR NEGATIVE 02/20/2015 0944   Sepsis Labs: _0 (procalcitonin:4,lacticidven:4) )No results found for this or any previous visit (from the past 240 hour(s)).   Radiological Exams on Admission: Dg Chest 2 View  Result Date: 06/04/2016 CLINICAL DATA:  75 year old male with shortness of breath, central chest pressure for 1 week. Congestion. Initial encounter. Former smoker EXAM: CHEST  2 VIEW COMPARISON:  02/18/2016 and earlier. FINDINGS: Calcified aortic atherosclerosis. Normal cardiac size and mediastinal contours. Visualized tracheal air column is within normal limits. Stable lung volumes, at the upper limits of normal. Mild chronic increased interstitial markings in both lungs appear stable. No pneumothorax, pulmonary edema, pleural effusion or acute pulmonary opacity. Stable epigastric surgical clips. Osteopenia. No acute osseous abnormality identified. IMPRESSION: 1. No acute cardiopulmonary abnormality. Chronic interstitial pulmonary changes suspected. 2.  Calcified aortic atherosclerosis. Electronically Signed   By: Genevie Ann M.D.   On: 06/04/2016 13:31    EKG: Independently reviewed. Sinus rhythm with abnormal P-axis, LAD, pulm dx pattern   Assessment/Plan  1. Exertional dyspnea  - Uncertain etiology; primary concerns are for PE or ACS  - EKG is without acute ischemic features and  repeat troponin measurements wnl, making ACS unlikely  - V/Q scan ordered to exclude PE  - Mild vol excess likely contributing and nephrology has been consulted for maintenance HD   2. Left arm paresthesia   -  Vascular surgery evaluated pt in ED given the poor distal LUE pulses; official read on arterial US pending, but vessels appeared patent; vascular planning for possible outpatient fistula ligation    3. ESRD on HD - Missed HD on day of admission  - No indication for urgent HD tonight - Nephrology notified of admission and maintenance HD requested -   4. Macrocytic anemia, thrombocytopenia  - On admission, Hgb is 10.4, MCV 108.4, and platelets 137,000  - Hgb and platelets stable relative to prior CBC's, but MCV has increased  - No s/s of active blood-loss on admission  - Check B12 and folate levels and supplement prn   5. Insulin-dependent DM  - A1c 7.2% in April 2017  - Managed at home with Actos, Lantus 8 units qAM, and Novolog 5-8 units TID; hold the oral agent while in hospital  - Check CBG with meals and qHS  - Start with Lantus 8 units qD plus moderate-intensity sliding-scale correctional    6. Hypertension  - At goal currently  - Continue Coreg   7. Hypothyroidism  - Appears to be stable  - Continue Synthroid   8. OSA  - Continue CPAP qHS    DVT prophylaxis: sq heparin Code Status: Full Family Communication: Wife updated at bedside  Disposition Plan: Observe on telemetry Consults called: Vascular surgery Admission status: Observation    Vianne Bulls, MD [facility] Pager [phone]  If 7PM-7AM, please contact night-coverage [email or web address] Password [email or web address]  06/04/2016, 10:08 PM

## 2016-06-05 ENCOUNTER — Observation Stay (HOSPITAL_COMMUNITY): Payer: Medicare Other

## 2016-06-05 ENCOUNTER — Other Ambulatory Visit: Payer: Medicare Other

## 2016-06-05 ENCOUNTER — Observation Stay (HOSPITAL_BASED_OUTPATIENT_CLINIC_OR_DEPARTMENT_OTHER): Payer: Medicare Other

## 2016-06-05 DIAGNOSIS — R0602 Shortness of breath: Secondary | ICD-10-CM

## 2016-06-05 DIAGNOSIS — Z79899 Other long term (current) drug therapy: Secondary | ICD-10-CM | POA: Diagnosis not present

## 2016-06-05 DIAGNOSIS — M79609 Pain in unspecified limb: Secondary | ICD-10-CM | POA: Diagnosis not present

## 2016-06-05 DIAGNOSIS — R0600 Dyspnea, unspecified: Secondary | ICD-10-CM | POA: Diagnosis not present

## 2016-06-05 DIAGNOSIS — Z7982 Long term (current) use of aspirin: Secondary | ICD-10-CM | POA: Diagnosis not present

## 2016-06-05 DIAGNOSIS — E039 Hypothyroidism, unspecified: Secondary | ICD-10-CM | POA: Diagnosis not present

## 2016-06-05 DIAGNOSIS — R531 Weakness: Secondary | ICD-10-CM | POA: Diagnosis not present

## 2016-06-05 DIAGNOSIS — M25512 Pain in left shoulder: Secondary | ICD-10-CM | POA: Diagnosis not present

## 2016-06-05 DIAGNOSIS — Z992 Dependence on renal dialysis: Secondary | ICD-10-CM | POA: Diagnosis not present

## 2016-06-05 DIAGNOSIS — I120 Hypertensive chronic kidney disease with stage 5 chronic kidney disease or end stage renal disease: Secondary | ICD-10-CM | POA: Diagnosis not present

## 2016-06-05 DIAGNOSIS — E1122 Type 2 diabetes mellitus with diabetic chronic kidney disease: Secondary | ICD-10-CM | POA: Diagnosis not present

## 2016-06-05 DIAGNOSIS — N281 Cyst of kidney, acquired: Secondary | ICD-10-CM | POA: Diagnosis not present

## 2016-06-05 DIAGNOSIS — R0609 Other forms of dyspnea: Secondary | ICD-10-CM | POA: Diagnosis not present

## 2016-06-05 DIAGNOSIS — J439 Emphysema, unspecified: Secondary | ICD-10-CM | POA: Diagnosis not present

## 2016-06-05 DIAGNOSIS — D649 Anemia, unspecified: Secondary | ICD-10-CM | POA: Diagnosis not present

## 2016-06-05 DIAGNOSIS — N186 End stage renal disease: Secondary | ICD-10-CM | POA: Diagnosis not present

## 2016-06-05 DIAGNOSIS — M79642 Pain in left hand: Secondary | ICD-10-CM | POA: Diagnosis not present

## 2016-06-05 DIAGNOSIS — I2510 Atherosclerotic heart disease of native coronary artery without angina pectoris: Secondary | ICD-10-CM | POA: Diagnosis not present

## 2016-06-05 DIAGNOSIS — Z794 Long term (current) use of insulin: Secondary | ICD-10-CM | POA: Diagnosis not present

## 2016-06-05 LAB — BASIC METABOLIC PANEL
Anion gap: 15 (ref 5–15)
BUN: 51 mg/dL — ABNORMAL HIGH (ref 6–20)
CO2: 27 mmol/L (ref 22–32)
Calcium: 8.1 mg/dL — ABNORMAL LOW (ref 8.9–10.3)
Chloride: 100 mmol/L — ABNORMAL LOW (ref 101–111)
Creatinine, Ser: 9.83 mg/dL — ABNORMAL HIGH (ref 0.61–1.24)
GFR calc Af Amer: 5 mL/min — ABNORMAL LOW (ref >60)
GFR calc non Af Amer: 5 mL/min — ABNORMAL LOW (ref >60)
Glucose, Bld: 85 mg/dL (ref 65–99)
Potassium: 4.2 mmol/L (ref 3.5–5.1)
Sodium: 142 mmol/L (ref 135–145)

## 2016-06-05 LAB — HEPATIC FUNCTION PANEL
ALT: 15 U/L — ABNORMAL LOW (ref 17–63)
AST: 26 U/L (ref 15–41)
Albumin: 3.1 g/dL — ABNORMAL LOW (ref 3.5–5.0)
Alkaline Phosphatase: 102 U/L (ref 38–126)
Bilirubin, Direct: <0.1 mg/dL — ABNORMAL LOW (ref 0.1–0.5)
Total Bilirubin: 0.6 mg/dL (ref 0.3–1.2)
Total Protein: 5.9 g/dL — ABNORMAL LOW (ref 6.5–8.1)

## 2016-06-05 LAB — CBC
HCT: 23.5 % — ABNORMAL LOW (ref 39.0–52.0)
HCT: 30.6 % — ABNORMAL LOW (ref 39.0–52.0)
Hemoglobin: 7.7 g/dL — ABNORMAL LOW (ref 13.0–17.0)
Hemoglobin: 9.7 g/dL — ABNORMAL LOW (ref 13.0–17.0)
MCH: 34.7 pg — ABNORMAL HIGH (ref 26.0–34.0)
MCH: 33.8 pg (ref 26.0–34.0)
MCHC: 32.8 g/dL (ref 30.0–36.0)
MCHC: 31.7 g/dL (ref 30.0–36.0)
MCV: 105.9 fL — ABNORMAL HIGH (ref 78.0–100.0)
MCV: 106.6 fL — ABNORMAL HIGH (ref 78.0–100.0)
Platelets: UNDETERMINED 10*3/uL (ref 150–400)
Platelets: 141 10*3/uL — ABNORMAL LOW (ref 150–400)
RBC: 2.22 MIL/uL — ABNORMAL LOW (ref 4.22–5.81)
RBC: 2.87 MIL/uL — ABNORMAL LOW (ref 4.22–5.81)
RDW: 16.2 % — ABNORMAL HIGH (ref 11.5–15.5)
RDW: 15.9 % — ABNORMAL HIGH (ref 11.5–15.5)
WBC: 8 10*3/uL (ref 4.0–10.5)
WBC: 7.4 10*3/uL (ref 4.0–10.5)

## 2016-06-05 LAB — IRON AND TIBC
Iron: 97 ug/dL (ref 45–182)
Saturation Ratios: 36 % (ref 17.9–39.5)
TIBC: 267 ug/dL (ref 250–450)
UIBC: 170 ug/dL

## 2016-06-05 LAB — TYPE AND SCREEN
ABO/RH(D): O POS
Antibody Screen: NEGATIVE

## 2016-06-05 LAB — TROPONIN I
Troponin I: <0.03 ng/mL (ref <0.03)
Troponin I: <0.03 ng/mL (ref <0.03)
Troponin I: <0.03 ng/mL (ref <0.03)

## 2016-06-05 LAB — GLUCOSE, CAPILLARY
Glucose-Capillary: 82 mg/dL (ref 65–99)
Glucose-Capillary: 123 mg/dL — ABNORMAL HIGH (ref 65–99)
Glucose-Capillary: 209 mg/dL — ABNORMAL HIGH (ref 65–99)

## 2016-06-05 LAB — PROTIME-INR
INR: 1.01
Prothrombin Time: 13.3 seconds (ref 11.4–15.2)

## 2016-06-05 LAB — CK: Total CK: 61 U/L (ref 49–397)

## 2016-06-05 LAB — ABO/RH: ABO/RH(D): O POS

## 2016-06-05 LAB — MRSA PCR SCREENING: MRSA by PCR: NEGATIVE

## 2016-06-05 LAB — VITAMIN B12: Vitamin B-12: 1116 pg/mL — ABNORMAL HIGH (ref 180–914)

## 2016-06-05 MED ORDER — DARBEPOETIN ALFA 40 MCG/0.4ML IJ SOSY
PREFILLED_SYRINGE | INTRAMUSCULAR | Status: AC
  Administered 2016-06-05: 40 ug
  Filled 2016-06-05: qty 0.4

## 2016-06-05 MED ORDER — HEPARIN SODIUM (PORCINE) 5000 UNIT/ML IJ SOLN
5000.0000 [IU] | Freq: Three times a day (TID) | INTRAMUSCULAR | Status: DC
  Administered 2016-06-05 – 2016-06-06 (×2): 5000 [IU] via SUBCUTANEOUS
  Filled 2016-06-05 (×2): qty 1

## 2016-06-05 MED ORDER — HEPARIN SODIUM (PORCINE) 1000 UNIT/ML DIALYSIS: 1000.0000 [IU] | INTRAMUSCULAR | Status: DC | PRN

## 2016-06-05 MED ORDER — DARBEPOETIN ALFA 40 MCG/0.4ML IJ SOSY
40.0000 ug | PREFILLED_SYRINGE | Freq: Once | INTRAMUSCULAR | Status: AC
  Administered 2016-06-05: 40 ug via INTRAVENOUS

## 2016-06-05 MED ORDER — CARVEDILOL 12.5 MG PO TABS: 12.5000 mg | ORAL_TABLET | Freq: Two times a day (BID) | ORAL | Status: DC

## 2016-06-05 MED ORDER — HYDROCORTISONE NA SUCCINATE PF 100 MG IJ SOLR: 100.0000 mg | Freq: Four times a day (QID) | INTRAMUSCULAR | Status: DC

## 2016-06-05 MED ORDER — SODIUM CHLORIDE 0.9 % IV SOLN: 100.0000 mL | INTRAVENOUS | Status: DC | PRN

## 2016-06-05 MED ORDER — PENTAFLUOROPROP-TETRAFLUOROETH EX AERO: 1.0000 "application " | INHALATION_SPRAY | CUTANEOUS | Status: DC | PRN

## 2016-06-05 MED ORDER — LIDOCAINE-PRILOCAINE 2.5-2.5 % EX CREA: 1.0000 "application " | TOPICAL_CREAM | CUTANEOUS | Status: DC | PRN

## 2016-06-05 MED ORDER — DOXERCALCIFEROL 0.5 MCG PO CAPS
2.0000 ug | ORAL_CAPSULE | Freq: Once | ORAL | Status: AC
  Administered 2016-06-05: 2 ug via ORAL

## 2016-06-05 MED ORDER — LIDOCAINE HCL (PF) 1 % IJ SOLN: 5.0000 mL | INTRAMUSCULAR | Status: DC | PRN

## 2016-06-05 MED ORDER — IOPAMIDOL (ISOVUE-300) INJECTION 61%
15.0000 mL | INTRAVENOUS | Status: AC
  Administered 2016-06-05 (×2): 15 mL via ORAL

## 2016-06-05 MED ORDER — ALTEPLASE 2 MG IJ SOLR: 2.0000 mg | Freq: Once | INTRAMUSCULAR | Status: DC | PRN

## 2016-06-05 MED ORDER — DOXERCALCIFEROL 0.5 MCG PO CAPS
ORAL_CAPSULE | ORAL | Status: AC
  Filled 2016-06-05: qty 4

## 2016-06-05 NOTE — Evaluation (Signed)
Physical Therapy Evaluation Patient Details Name: [name] MRN: [id number] DOB: 1941-05-25 Today's Date: 06/05/2016   History of Present Illness  [name] is a 75 y.o. male with medical history significant for end-stage renal disease on hemodialysis, insulin-dependent diabetes mellitus, hypertension, and coronary artery disease 2% emergency department with progressive dyspnea on exertion and paresthesia, pain, and coolness to the left upper extremity.  Clinical Impression  Pt functioning near baseline but presents with generalized weakness, impaired balance and increased falls. Pt scored 16 on DGI and reports 2 falls in the last 6 weeks. Pt SpO2 maintained 100% during ambulation but did demo mild DOE. Pt to benefit from outpt PT to address deficits and decrease falls risk.    Follow Up Recommendations Outpatient PT;Supervision - Intermittent    Equipment Recommendations  None recommended by PT    Recommendations for Other Services       Precautions / Restrictions Precautions Precautions: Fall Precaution Comments: L UE cold and cyonatic at the finger tips Restrictions Weight Bearing Restrictions: No      Mobility  Bed Mobility Overal bed mobility: Modified Independent             General bed mobility comments: increased time, use of bed rail  Transfers Overall transfer level: Needs assistance Equipment used: None Transfers: Sit to/from Stand Sit to Stand: Min guard         General transfer comment: pt had to stand initially due to mild unsteadiness ("get my head straight")  Ambulation/Gait Ambulation/Gait assistance: Min assist Ambulation Distance (Feet): 200 Feet Assistive device: None Gait Pattern/deviations: Step-through pattern;Decreased stride length;Wide base of support Gait velocity: fair   General Gait Details: no overt episode of LOB, however mildly unsteady with mild SOB, SpO2 at 100% on R UE, atetmpted to take on L hand however unable to  read  Stairs Stairs: Yes Stairs assistance: Min guard Stair Management: One rail Right Number of Stairs: 10 General stair comments: ascend reciprocating, descend step to patern  Wheelchair Mobility    Modified Rankin (Stroke Patients Only)       Balance Overall balance assessment: History of Falls (2 falls in the last 6 weeks)                               Standardized Balance Assessment Standardized Balance Assessment : Dynamic Gait Index   Dynamic Gait Index Level Surface: Mild Impairment Change in Gait Speed: Mild Impairment Gait with Horizontal Head Turns: Mild Impairment Gait with Vertical Head Turns: Mild Impairment Gait and Pivot Turn: Mild Impairment Step Over Obstacle: Mild Impairment Step Around Obstacles: Mild Impairment Steps: Mild Impairment Total Score: 16       Pertinent Vitals/Pain Pain Assessment: 0-10 Pain Score: 4  Pain Location: low back and calves Pain Descriptors / Indicators: Sore Pain Intervention(s): Monitored during session    Home Living Family/patient expects to be discharged to:: Private residence Living Arrangements: Spouse/significant other Available Help at Discharge: Family;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 hours/day Type of Home: House Home Access: Stairs to enter Entrance Stairs-Rails: Right Entrance Stairs-Number of Steps: 2 Home Layout: Two level;Able to live on main level with bedroom/bathroom Home Equipment: Kasandra Knudsen - single point      Prior Function Level of Independence: Independent         Comments: but has had 2 recent falls in the last 6 weeks     Hand Dominance   Dominant Hand: Right    Extremity/Trunk Assessment  Upper Extremity Assessment: LUE deficits/detail       LUE Deficits / Details: grossly 3+/5, gave way with elbow flexion and shoulder flexion, fingers are cold to touch   Lower Extremity Assessment: Generalized weakness      Cervical / Trunk Assessment: Normal  Communication   Communication:  No difficulties  Cognition Arousal/Alertness: Awake/alert Behavior During Therapy: WFL for tasks assessed/performed Overall Cognitive Status: Within Functional Limits for tasks assessed                      General Comments      Exercises        Assessment/Plan    PT Assessment Patient needs continued PT services  PT Diagnosis Generalized weakness   PT Problem List Decreased strength;Decreased balance;Decreased activity tolerance;Decreased mobility;Cardiopulmonary status limiting activity  PT Treatment Interventions DME instruction;Gait training;Stair training;Functional mobility training;Therapeutic activities;Therapeutic exercise;Balance training;Neuromuscular re-education   PT Goals (Current goals can be found in the Care Plan section) Acute Rehab PT Goals Patient Stated Goal: figure out whats wrong with my arm PT Goal Formulation: With patient Time For Goal Achievement: 06/12/16 Potential to Achieve Goals: Good Additional Goals Additional Goal #1: Pt to score >19 on DGI to indicate minimal falls risk.    Frequency Min 3X/week   Barriers to discharge        Co-evaluation               End of Session Equipment Utilized During Treatment: Gait belt Activity Tolerance: Patient tolerated treatment well Patient left: in chair;with call bell/phone within reach Nurse Communication: Mobility status    Functional Assessment Tool Used: clinical judgement Functional Limitation: Mobility: Walking and moving around Mobility: Walking and Moving Around Current Status (S2831): At least 20 percent but less than 40 percent impaired, limited or restricted Mobility: Walking and Moving Around Goal Status [phone]): At least 1 percent but less than 20 percent impaired, limited or restricted    Time: 1001-1026 PT Time Calculation (min) (ACUTE ONLY): 25 min   Charges:   PT Evaluation $PT Eval Moderate Complexity: 1 Procedure PT Treatments $Gait Training: 8-22 mins    PT G Codes:   PT G-Codes **NOT FOR INPATIENT CLASS** Functional Assessment Tool Used: clinical judgement Functional Limitation: Mobility: Walking and moving around Mobility: Walking and Moving Around Current Status (Y0737): At least 20 percent but less than 40 percent impaired, limited or restricted Mobility: Walking and Moving Around Goal Status [phone]): At least 1 percent but less than 20 percent impaired, limited or restricted    Kingsley Callander 06/05/2016, 11:31 AM   Kittie Plater, PT, DPT Pager #: [phone] Office #: [phone]

## 2016-06-05 NOTE — Procedures (Signed)
Patient was seen on dialysis and the procedure was supervised.  BFR 400  Via AVF BP is  108/48.   Patient appears to be tolerating treatment well- missed Monday- this HD is off schedule   [name] 06/05/2016

## 2016-06-05 NOTE — Progress Notes (Addendum)
PROGRESS NOTE  [name] WNI:[id number] DOB: September 08, 1941 DOA: 06/04/2016 PCP: Elby Showers, MD  HPI/Recap of past 24 hours:  Report progressive dyspea, progressive left shoulder pain, left arm numbness which is positional , worse when he lay on left shoulder and arm  Reports several falls recently , he still drives.  Assessment/Plan: Principal Problem:   Exertional dyspnea Active Problems:   Insulin dependent diabetes mellitus (HCC)   Essential hypertension   Hypothyroidism   OSA (obstructive sleep apnea)   ESRD on dialysis (HCC)   Macrocytic anemia   Thrombocytopenia (HCC)   Arm paresthesia, left   Dyspnea on exertion   Tenderness of right calf   Dyspnea   1. Exertional dyspnea , progressive - Uncertain etiology;  - EKG is without acute ischemic features ,troponin negative, - VQ scan to r/o PE, not able to get  CTA due to poor iv access - nephrology to manage volume, currently no lower extremity edema, on room air at rest, denies sob at rest   2. Left arm paresthesia  , also report feeling cold - Vascular surgery evaluated pt in ED given the poor distal LUE pulses;  -arterial duplex vessels appeared patent; vascular planning for possible outpatient fistula ligation for steal if symptom persist -also report left shoulder pain, will get left shoulder x ray  3. leg weakness with falls, denies leg pain, ABI pending, outpatient follow up with vascular surgery , please see vascular surgery consult note for details, will check ck.  4. ESRD on HD MWF, has add on dialysis on 9/10, missed regular HD Monday. - - Nephrology consulted, I talked to Dr Lottie Rater on 9/12  5. Macrocytic anemia, thrombocytopenia  - Hgb is 10.4, MCV 108.4, and platelets 137,000  - hgb dropped to 7.7, no sign of external bleed, low bp documented last night, with h/o aortic surgery, will check CT chest and ab to r/o internal bleed, anemia labs pending, repeat cbc, prbc transfusion through HD if  needed  6. Insulin-dependent DM  - A1c 7.2% in April 2017  - Managed at home with Actos, Lantus 8 units qAM, and Novolog 5-8 units TID; hold the oral agent while in hospital  - Start with Lantus 8 units qD plus moderate-intensity sliding-scale correctional    7. Hypertension  - low bp documented last night , patient bp stable this am, and asymptomatic - Coreg with holding parameters  7. Hypothyroidism  - Appears to be stable  - Continue Synthroid   8. OSA  - Continue CPAP qHS     Addendum: RN paged me at 7:05 pm to report patient has hypotension, but does not seem symptomatic, CT chest/ab no internal bleed, repeat hgb stable without needing prbc transfusion, troponin negative, VQ scan pending, but patient does not have hypoxia, denies sob or chest pain. He is a HD patient, will try to avoid aggressive fluids, will try stress dose steroids. Night coverage to continue monitor patient.  DVT prophylaxis: sq heparin Code Status: Full Family Communication: patient Disposition Plan: home once medically stable Consults called: Vascular surgery, neprhrology  talked to Dr patel on 9/12  Procedures:  dialysis  Antibiotics:  none   Objective: BP (!) 108/48 (BP Location: Left Leg)   Pulse (!) 57   Temp 98 F (36.7 C) (Oral)   Resp 18   Ht 5\' 11"  (1.803 m)   Wt 90.5 kg (199 lb 8 oz)   SpO2 98%   BMI 27.82 kg/m  No intake or output data  in the 24 hours ending 06/05/16 0905 Filed Weights   06/04/16 1239 06/04/16 2255 06/05/16 0624  Weight: 92.5 kg (204 lb) 90.8 kg (200 lb 3.2 oz) 90.5 kg (199 lb 8 oz)    Exam:   General:  NAD  Cardiovascular: RRR  Respiratory: CTABL  Abdomen: Soft/ND/NT, positive BS, midline surgical scar (old scar)  Musculoskeletal: No Edema  Neuro: aaox3  Data Reviewed: Basic Metabolic Panel:  Recent Labs Lab 06/04/16 1248 06/05/16 0705  NA 140 142  K 4.4 4.2  CL 97* 100*  CO2 26 27  GLUCOSE 215* 85  BUN 42* 51*  CREATININE  8.64* 9.83*  CALCIUM 8.4* 8.1*   Liver Function Tests: No results for input(s): AST, ALT, ALKPHOS, BILITOT, PROT, ALBUMIN in the last 168 hours. No results for input(s): LIPASE, AMYLASE in the last 168 hours. No results for input(s): AMMONIA in the last 168 hours. CBC:  Recent Labs Lab 06/04/16 1248 06/05/16 0705  WBC 8.8 8.0  HGB 10.4* 7.7*  HCT 32.3* 23.5*  MCV 108.4* 105.9*  PLT 137* PLATELET CLUMPS NOTED ON SMEAR, UNABLE TO ESTIMATE   Cardiac Enzymes:    Recent Labs Lab 06/04/16 1832 06/05/16 0039 06/05/16 0705  TROPONINI <0.03 <0.03 <0.03   BNP (last 3 results)  Recent Labs  06/04/16 1248  BNP 249.1*    ProBNP (last 3 results) No results for input(s): PROBNP in the last 8760 hours.  CBG:  Recent Labs Lab 06/04/16 2305 06/05/16 0745  GLUCAP 91 82    Recent Results (from the past 240 hour(s))  MRSA PCR Screening     Status: None   Collection Time: 06/05/16 12:12 AM  Result Value Ref Range Status   MRSA by PCR NEGATIVE NEGATIVE Final    Comment:        The GeneXpert MRSA Assay (FDA approved for NASAL specimens only), is one component of a comprehensive MRSA colonization surveillance program. It is not intended to diagnose MRSA infection nor to guide or monitor treatment for MRSA infections.      Studies: Dg Chest 2 View  Result Date: 06/04/2016 CLINICAL DATA:  75 year old male with shortness of breath, central chest pressure for 1 week. Congestion. Initial encounter. Former smoker EXAM: CHEST  2 VIEW COMPARISON:  02/18/2016 and earlier. FINDINGS: Calcified aortic atherosclerosis. Normal cardiac size and mediastinal contours. Visualized tracheal air column is within normal limits. Stable lung volumes, at the upper limits of normal. Mild chronic increased interstitial markings in both lungs appear stable. No pneumothorax, pulmonary edema, pleural effusion or acute pulmonary opacity. Stable epigastric surgical clips. Osteopenia. No acute osseous  abnormality identified. IMPRESSION: 1. No acute cardiopulmonary abnormality. Chronic interstitial pulmonary changes suspected. 2.  Calcified aortic atherosclerosis. Electronically Signed   By: Genevie Ann M.D.   On: 06/04/2016 13:31    Scheduled Meds: . allopurinol  100 mg Oral Daily  . aspirin  650 mg Oral Daily  . carvedilol  12.5 mg Oral BID WC  . cinacalcet  30 mg Oral Q breakfast  . donepezil  10 mg Oral QHS  . heparin  5,000 Units Subcutaneous Q8H  . insulin aspart  0-15 Units Subcutaneous TID WC  . insulin aspart  0-5 Units Subcutaneous QHS  . insulin glargine  8 Units Subcutaneous Daily  . levothyroxine  50 mcg Oral QAC breakfast  . metroNIDAZOLE   Topical BID  . sevelamer carbonate  1,600 mg Oral BID WC   And  . sevelamer carbonate  2,400 mg Oral QAC supper  .  simvastatin  20 mg Oral QHS    Continuous Infusions:    Time spent: 50mins  Arjay Jaskiewicz MD, PhD  [facility] Pager [phone]. If 7PM-7AM, please contact night-coverage at [email or web address], password [facility] 06/05/2016, 9:05 AM  LOS: 0 days

## 2016-06-05 NOTE — Progress Notes (Signed)
Patient returned from Hemodialysis with BP 74/42.  Patient asymptomatic.  Dr. Erlinda Hong paged and notified.  Request night time extender be notified to come and assess patient.  Primary Nurse Colletta Maryland updated and to page extender.  Sanda Linger

## 2016-06-05 NOTE — Progress Notes (Signed)
VASCULAR LAB PRELIMINARY  PRELIMINARY  PRELIMINARY  PRELIMINARY  Right lower extremity venous duplex completed.    Preliminary report:  Right:  No evidence of DVT, superficial thrombosis, or Baker's cyst.  Breckyn Ticas, RVS 06/05/2016, 12:42 PM

## 2016-06-05 NOTE — Consult Note (Signed)
[facility] Renal Consultation Note    Indication for Consultation:  Management of ESRD/hemodialysis; anemia, hypertension/volume and secondary hyperparathyroidism PCP: [facility]  HPI: [name] is a 75 y.o. male with ESRD on hemodialysis since 01/2015 2/2 DM/HTN. PMH of DM, HTN, hypothyroidism, PVD, CAD, GERD, OSA, gout, PNA, diverticulitis.   Patient presented to ED with C/O pain and coolness in LUE and progressive SOB for past two weeks. Pain and paresthesia is distal to old AVF site. Upon arrival to ED, EKG showed SR, CXR showed Chronic interstitial pulmonary changes, otherwise WNL, CBC, BMET unremarkable. Xray L shoulder showed no abnormalities.  VVS was consulted for evaluation for possible steal syndrome. Arterial duplex showed patient arteries to L hand. Patient has history of PAD-will follow up with Dr. Oneida Alar as OP for possible ligation of LUA AVF if symptoms persist. He has been admitted for observation for progressive DOE, we have been asked to manage his hemodialysis.   Patient has HD at [facility]- MWF. He is generally compliant with treatment, gets to EDW, modest IDWG. He missed HD 06/04/16 D/T coming to hospital for evaluation. Will have HD today off schedule.   Past Medical History:  Diagnosis Date  . Allergy   . Anemia   . Arthritis   . Coronary artery disease   . Diabetes mellitus    Type 2  . Diverticulitis   . ED (erectile dysfunction)   . 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 ESRD (end stage renal disease) on dialysis ([place]) 03/2015  . GERD (gastroesophageal reflux disease)    pepto   . Gout   . Hyperlipidemia   . Hypertension   . Hypothyroidism   . Pneumonia   . PVD (peripheral vascular disease) ([place])    has plastic aorta  . Renal insufficiency   . Seasonal allergies   . Shortness of breath dyspnea   . Sleep apnea   . Thyroid disease    Past Surgical History:  Procedure Laterality Date  . aortobifemoral bypass    . AV FISTULA  PLACEMENT Left 12/01/2013   Procedure: ARTERIOVENOUS (AV) FISTULA CREATION- LEFT BRACHIOCEPHALIC;  Surgeon: Angelia Mould, MD;  Location: [place];  Service: Vascular;  Laterality: Left;  . [place] TRANSPOSITION Right 07/27/2014   Procedure: BASCILIC VEIN TRANSPOSITION;  Surgeon: Angelia Mould, MD;  Location: [place];  Service: Vascular;  Laterality: Right;  . BREAST SURGERY     left - granulomatous mastitis  . COLONOSCOPY    . 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 EYE SURGERY Bilateral    cataracts  . REVISON OF ARTERIOVENOUS FISTULA Left 02/09/2014   Procedure: REVISON OF LEFT ARTERIOVENOUS FISTULA - RESECTION OF RENDUNDANT VEIN;  Surgeon: Angelia Mould, MD;  Location: [place];  Service: Vascular;  Laterality: Left;  . SBO with lysis adhesions    . SHUNTOGRAM Left 04/19/2014   Procedure: FISTULOGRAM;  Surgeon: Angelia Mould, MD;  Location: [facility] CATH LAB;  Service: Cardiovascular;  Laterality: Left;  . UNILATERAL UPPER EXTREMEITY ANGIOGRAM N/A 07/12/2014   Procedure: UNILATERAL UPPER Anselmo Rod;  Surgeon: Angelia Mould, MD;  Location: [facility] CATH LAB;  Service: Cardiovascular;  Laterality: N/A;   Family History  Problem Relation Age of Onset  . Aneurysm Mother   . Heart disease Father   . Stroke Father   . Hypertension Father   . Diabetes Father   . Dementia Neg Hx    Social History:  reports  that he quit smoking about 21 years ago. He has never used smokeless tobacco. He reports that he does not drink alcohol or use drugs. Allergies  Allergen Reactions  . Penicillins Rash    Has patient had a PCN reaction causing immediate rash, facial/tongue/throat swelling, SOB or lightheadedness with hypotension: Yes Has patient had a PCN reaction causing severe rash involving mucus membranes or skin necrosis: Yes Has patient had a PCN reaction that required hospitalization: No Has patient had a PCN reaction occurring within the last 10  years: No If all of the above answers are "NO", then may proceed with Cephalosporin use.    Prior to Admission medications   Medication Sig Start Date End Date Taking? Authorizing Provider  acetaminophen (TYLENOL) 500 MG tablet Take 500 mg by mouth every 6 (six) hours as needed (for pain).    Yes Historical Provider, MD  allopurinol (ZYLOPRIM) 100 MG tablet Take 100 mg by mouth daily.     Yes Historical Provider, MD  aspirin 325 MG tablet Take 650 mg by mouth daily.    Yes Historical Provider, MD  Blood Glucose Monitoring Suppl (FREESTYLE FREEDOM LITE) w/Device KIT Use to check blood sugar 2 times per day dx code E11.65 03/08/16  Yes Elayne Snare, MD  carvedilol (COREG) 12.5 MG tablet Take 12.5 mg by mouth 2 (two) times daily with a meal. 03/25/15  Yes Historical Provider, MD  clobetasol (TEMOVATE) 0.05 % external solution apply to affected area ON THE SCALP UP TO 2 TIMES A DAY AS NEEDED...  (REFER TO PRESCRIPTION NOTES). 04/19/15  Yes Historical Provider, MD  donepezil (ARICEPT) 10 MG tablet Take 1 tablet (10 mg total) by mouth at bedtime. 01/12/16  Yes Melvenia Beam, MD  FLUOCINOLONE ACETONIDE SCALP 0.01 % OIL apply to affected area ON SCALP EVERY NIGHT AT BEDTIME, [place] UPON AWAKENING 04/28/15  Yes Historical Provider, MD  fluticasone (CUTIVATE) 0.05 % cream apply to affected area ON FACE UP TO 2 TIMES A DAY AS NEEDED 04/19/15  Yes Historical Provider, MD  fluticasone (FLONASE) 50 MCG/ACT nasal spray Place 1 spray into both nostrils daily. Patient taking differently: Place 1 spray into both nostrils daily as needed for allergies.  10/08/14  Yes Maryann Mikhail, DO  glucose blood (ONETOUCH VERIO) test strip Use to check blood sugar 2 times per day. Dx code E11.65 03/01/16  Yes Elayne Snare, MD  insulin aspart (NOVOLOG) 100 UNIT/ML injection Inject 10 Units into the skin 3 (three) times daily before meals. Patient taking differently: Inject 5-8 Units into the skin See admin instructions. Two to three times a  day before meals 01/20/16  Yes Elayne Snare, MD  insulin glargine (LANTUS) 100 UNIT/ML injection Inject 0.1 mLs (10 Units total) into the skin daily. Patient taking differently: Inject 8 Units into the skin every morning.  05/25/15  Yes Elayne Snare, MD  ketoconazole (NIZORAL) 2 % shampoo Apply 1 application topically 2 (two) times a week. 03/31/15  Yes Elby Showers, MD  Lancets (FREESTYLE) lancets Use as instructed to check blood sugar 2 times per day dx code E11.65 02/22/16  Yes Elayne Snare, MD  levothyroxine (SYNTHROID, LEVOTHROID) 50 MCG tablet Take 1 tablet (50 mcg total) by mouth daily. 08/06/14  Yes Elby Showers, MD  lidocaine-prilocaine (EMLA) cream Apply 1 application topically once. AS DIRECTED 03/29/15  Yes Historical Provider, MD  loratadine (CLARITIN) 10 MG tablet Take 10 mg by mouth daily as needed for allergies.    Yes Historical Provider, MD  metroNIDAZOLE (METROGEL) 0.75 % gel APPLY TO AFFECTED AREA ON SKIN  ON FACE TWICE DAILY 06/14/15  Yes Historical Provider, MD  mupirocin ointment (BACTROBAN) 2 % Use as directed to affected areas 03/23/15  Yes Elby Showers, MD  Naftifine HCl (NAFTIN) 2 % CREA Apply to feet daily for fungal infection 09/12/15  Yes Elby Showers, MD  pioglitazone (ACTOS) 30 MG tablet Take 1 tablet (30 mg total) by mouth daily. 05/17/15  Yes Elayne Snare, MD  RENVELA 800 MG tablet Take 800-2,400 mg by mouth See admin instructions. 1,600-2,400 mg with breakfast then 1,600 mg with lunch then 2,400 mg with dinner (evening meal) and 800 mg with each snack 07/27/14  Yes Historical Provider, MD  SENSIPAR 30 MG tablet Take 30 mg by mouth daily.  12/01/15  Yes Historical Provider, MD  simvastatin (ZOCOR) 20 MG tablet Take 20 mg by mouth at bedtime.    Yes Historical Provider, MD  triamcinolone ointment (KENALOG) 0.1 % Apply 1 application topically 2 (two) times daily. Use between thighs   Yes Historical Provider, MD  atorvastatin (LIPITOR) 40 MG tablet Take 1 tablet (40 mg total) by mouth  daily. Patient not taking: Reported on 06/04/2016 10/14/15   Elby Showers, MD   Current Facility-Administered Medications  Medication Dose Route Frequency Provider Last Rate Last Dose  . 0.9 %  sodium chloride infusion  100 mL Intravenous PRN Valentina Gu, NP      . 0.9 %  sodium chloride infusion  100 mL Intravenous PRN Valentina Gu, NP      . acetaminophen (TYLENOL) tablet 650 mg  650 mg Oral Q4H PRN Vianne Bulls, MD      . allopurinol (ZYLOPRIM) tablet 100 mg  100 mg Oral Daily Vianne Bulls, MD   100 mg at 06/05/16 1101  . alteplase (CATHFLO ACTIVASE) injection 2 mg  2 mg Intracatheter Once PRN Valentina Gu, NP      . carvedilol (COREG) tablet 12.5 mg  12.5 mg Oral BID WC Florencia Reasons, MD   12.5 mg at 06/05/16 0824  . cinacalcet (SENSIPAR) tablet 30 mg  30 mg Oral Q breakfast Ilene Qua Opyd, MD      . donepezil (ARICEPT) tablet 10 mg  10 mg Oral QHS Vianne Bulls, MD   10 mg at 06/05/16 0005  . fluticasone (FLONASE) 50 MCG/ACT nasal spray 1 spray  1 spray Each Nare Daily PRN Vianne Bulls, MD      . heparin injection 1,000 Units  1,000 Units Dialysis PRN Valentina Gu, NP      . insulin aspart (novoLOG) injection 0-15 Units  0-15 Units Subcutaneous TID WC Timothy S Opyd, MD      . insulin aspart (novoLOG) injection 0-5 Units  0-5 Units Subcutaneous QHS Ilene Qua Opyd, MD      . insulin glargine (LANTUS) injection 8 Units  8 Units Subcutaneous Daily Vianne Bulls, MD   8 Units at 06/05/16 1102  . levothyroxine (SYNTHROID, LEVOTHROID) tablet 50 mcg  50 mcg Oral QAC breakfast Vianne Bulls, MD   50 mcg at 06/05/16 0824  . lidocaine (PF) (XYLOCAINE) 1 % injection 5 mL  5 mL Intradermal PRN Valentina Gu, NP      . lidocaine-prilocaine (EMLA) cream 1 application  1 application Topical PRN Valentina Gu, NP      . loratadine (CLARITIN) tablet 10 mg  10 mg Oral Daily PRN Vianne Bulls, MD      .  metroNIDAZOLE (METROGEL) 0.75 % gel   Topical BID Ilene Qua  Opyd, MD      . nitroGLYCERIN (NITROSTAT) SL tablet 0.4 mg  0.4 mg Sublingual Q5 Min x 3 PRN Vianne Bulls, MD      . ondansetron (ZOFRAN) injection 4 mg  4 mg Intravenous Q6H PRN Vianne Bulls, MD      . pentafluoroprop-tetrafluoroeth (GEBAUERS) aerosol 1 application  1 application Topical PRN Valentina Gu, NP      . sevelamer carbonate (RENVELA) tablet 1,600 mg  1,600 mg Oral BID WC Vianne Bulls, MD   1,600 mg at 06/05/16 9983   And  . sevelamer carbonate (RENVELA) tablet 2,400 mg  2,400 mg Oral QAC supper Vianne Bulls, MD      . simvastatin (ZOCOR) tablet 20 mg  20 mg Oral QHS Vianne Bulls, MD       Labs: Basic Metabolic Panel:  Recent Labs Lab 06/04/16 1248 06/05/16 0705  NA 140 142  K 4.4 4.2  CL 97* 100*  CO2 26 27  GLUCOSE 215* 85  BUN 42* 51*  CREATININE 8.64* 9.83*  CALCIUM 8.4* 8.1*   Liver Function Tests:  Recent Labs Lab 06/05/16 1056  AST 26  ALT 15*  ALKPHOS 102  BILITOT 0.6  PROT 5.9*  ALBUMIN 3.1*   No results for input(s): LIPASE, AMYLASE in the last 168 hours. No results for input(s): AMMONIA in the last 168 hours. CBC:  Recent Labs Lab 06/04/16 1248 06/05/16 0705 06/05/16 1157  WBC 8.8 8.0 7.4  HGB 10.4* 7.7* 9.7*  HCT 32.3* 23.5* 30.6*  MCV 108.4* 105.9* 106.6*  PLT 137* PLATELET CLUMPS NOTED ON SMEAR, UNABLE TO ESTIMATE 141*   Cardiac Enzymes:  Recent Labs Lab 06/04/16 1832 06/05/16 0039 06/05/16 0705 06/05/16 1056 06/05/16 1157  CKTOTAL  --   --   --   --  45  TROPONINI <0.03 <0.03 <0.03 <0.03  --    CBG:  Recent Labs Lab 06/04/16 2305 06/05/16 0745 06/05/16 1117  GLUCAP 91 82 123*   Iron Studies:  Recent Labs  06/05/16 1056  IRON 97  TIBC 267   Studies/Results: Ct Abdomen Pelvis Wo Contrast  Result Date: 06/05/2016 CLINICAL DATA:  75 year old with history of aortic surgery and acute anemia. History of end-stage renal disease. EXAM: CT CHEST, ABDOMEN AND PELVIS WITHOUT CONTRAST TECHNIQUE:  Multidetector CT imaging of the chest, abdomen and pelvis was performed following the standard protocol without IV contrast. COMPARISON:  Abdominal CT 01/27/2007 FINDINGS: CT CHEST FINDINGS Cardiovascular: Diffuse atherosclerotic calcifications involving the thoracic aorta. The mid ascending thoracic aorta measures up to 3.7 cm. Origin of the great vessels are heavily calcified. Diffuse coronary artery calcifications. Descending thoracic aorta measures up to 3.0 cm. Mediastinum/Nodes: Calcifications in the mediastinum and right hilum compatible with old granulomatous disease. No suspicious lymphadenopathy. Largest mediastinal lymph node is in the pretracheal region measuring 1.0 cm in short axis. Lungs/Pleura: Trachea and mainstem bronchi are patent. Mild emphysematous disease in the upper lungs. Few pleural-based densities at the right lung apex probably represent scarring. Irregular nodular density in the lingula roughly measures 4 mm on sequence 6, image 93. Punctate nodule in the left lower lobe on sequence 3, image 125. No large areas of lung consolidation. No significant pleural fluid. Subtle peripheral densities in the right lower lobe. Musculoskeletal: Compression deformity involving the C7 vertebral body is age-indeterminate. CT ABDOMEN PELVIS FINDINGS Hepatobiliary: 0.8 cm low-density structure in the right hepatic lobe  on sequence 7, image 135. Additional small low-density structures near the hepatic dome. These low-density structures are indeterminate but could represent small cysts. Normal appearance of the gallbladder. No biliary dilatation. Pancreas: Normal appearance of the pancreas without inflammation or duct dilatation. Spleen: Normal appearance of spleen without enlargement. Adrenals/Urinary Tract: Normal appearance of the adrenal glands. Numerous bilateral exophytic renal lesions. Most of these lesions are low-density and suggestive for renal cysts. One structures is hyperdense in the right  kidney interpolar region measuring up to 1.1 cm and probably new from the previous examination. These renal cysts have progressed from the previous examination. In addition, there has been progressive bilateral renal atrophy, left side greater than right. No hydronephrosis. Small calcification in left kidney could be vascular in etiology. Normal appearance of the urinary bladder. Stomach/Bowel: Diverticulosis in the sigmoid colon. No evidence for bowel obstruction. Diverticulosis in the right colon. Difficult to exclude asymmetric small bowel wall thickening on sequence 7, image 196. These findings are nonspecific and could be related to incomplete distension. Vascular/Lymphatic: Multiple surgical clips in the retroperitoneum near the abdominal aorta. Findings compatible with aortobifemoral bypass procedure. Common femoral arteries are heavily calcified. Visceral artery calcifications. No suspicious lymphadenopathy. Reproductive: No gross abnormality to the prostate or seminal vesicles. Other: No free fluid. No free air. Postsurgical changes along the anterior abdominal wall from previous aortic surgery. Left inguinal hernia containing fat. Musculoskeletal: Compression deformity involving the L5 vertebral body with sclerosis along the superior endplate. This is new since 2008 but probably represents a chronic deformity. There is extensive facet arthropathy at L4-L5. Joint space narrowing and degenerative changes in the right hip joint. IMPRESSION: No CT findings to explain blood loss or anemia. Bilateral renal cysts. At least one renal lesion is indeterminate in the right kidney. This hyperdense structure could represent a hemorrhagic or proteinaceous cyst but indeterminate. Bilateral renal atrophy without hydronephrosis. Findings compatible with end-stage renal disease. Postsurgical changes compatible aortobifemoral bypass procedure. Mild emphysematous changes in lungs with a few indeterminate small pulmonary  nodules. Largest measuring 4 mm. Consider noncontrast chest CT in 12 months due to underlying emphysematous disease. This recommendation follows the consensus statement: Guidelines for Management of Incidental Pulmonary Nodules Detected on CT Images:From the Fleischner Society 2017; published online before print (10.1148/[id number]). Age-indeterminate compression fractures at C7 and L5. Electronically Signed   By: Markus Daft M.D.   On: 06/05/2016 13:37   Dg Chest 2 View  Result Date: 06/04/2016 CLINICAL DATA:  75 year old male with shortness of breath, central chest pressure for 1 week. Congestion. Initial encounter. Former smoker EXAM: CHEST  2 VIEW COMPARISON:  02/18/2016 and earlier. FINDINGS: Calcified aortic atherosclerosis. Normal cardiac size and mediastinal contours. Visualized tracheal air column is within normal limits. Stable lung volumes, at the upper limits of normal. Mild chronic increased interstitial markings in both lungs appear stable. No pneumothorax, pulmonary edema, pleural effusion or acute pulmonary opacity. Stable epigastric surgical clips. Osteopenia. No acute osseous abnormality identified. IMPRESSION: 1. No acute cardiopulmonary abnormality. Chronic interstitial pulmonary changes suspected. 2.  Calcified aortic atherosclerosis. Electronically Signed   By: Genevie Ann M.D.   On: 06/04/2016 13:31   Ct Chest Wo Contrast  Result Date: 06/05/2016 CLINICAL DATA:  75 year old with history of aortic surgery and acute anemia. History of end-stage renal disease. EXAM: CT CHEST, ABDOMEN AND PELVIS WITHOUT CONTRAST TECHNIQUE: Multidetector CT imaging of the chest, abdomen and pelvis was performed following the standard protocol without IV contrast. COMPARISON:  Abdominal CT 01/27/2007 FINDINGS: CT CHEST  FINDINGS Cardiovascular: Diffuse atherosclerotic calcifications involving the thoracic aorta. The mid ascending thoracic aorta measures up to 3.7 cm. Origin of the great vessels are heavily  calcified. Diffuse coronary artery calcifications. Descending thoracic aorta measures up to 3.0 cm. Mediastinum/Nodes: Calcifications in the mediastinum and right hilum compatible with old granulomatous disease. No suspicious lymphadenopathy. Largest mediastinal lymph node is in the pretracheal region measuring 1.0 cm in short axis. Lungs/Pleura: Trachea and mainstem bronchi are patent. Mild emphysematous disease in the upper lungs. Few pleural-based densities at the right lung apex probably represent scarring. Irregular nodular density in the lingula roughly measures 4 mm on sequence 6, image 93. Punctate nodule in the left lower lobe on sequence 3, image 125. No large areas of lung consolidation. No significant pleural fluid. Subtle peripheral densities in the right lower lobe. Musculoskeletal: Compression deformity involving the C7 vertebral body is age-indeterminate. CT ABDOMEN PELVIS FINDINGS Hepatobiliary: 0.8 cm low-density structure in the right hepatic lobe on sequence 7, image 135. Additional small low-density structures near the hepatic dome. These low-density structures are indeterminate but could represent small cysts. Normal appearance of the gallbladder. No biliary dilatation. Pancreas: Normal appearance of the pancreas without inflammation or duct dilatation. Spleen: Normal appearance of spleen without enlargement. Adrenals/Urinary Tract: Normal appearance of the adrenal glands. Numerous bilateral exophytic renal lesions. Most of these lesions are low-density and suggestive for renal cysts. One structures is hyperdense in the right kidney interpolar region measuring up to 1.1 cm and probably new from the previous examination. These renal cysts have progressed from the previous examination. In addition, there has been progressive bilateral renal atrophy, left side greater than right. No hydronephrosis. Small calcification in left kidney could be vascular in etiology. Normal appearance of the urinary  bladder. Stomach/Bowel: Diverticulosis in the sigmoid colon. No evidence for bowel obstruction. Diverticulosis in the right colon. Difficult to exclude asymmetric small bowel wall thickening on sequence 7, image 196. These findings are nonspecific and could be related to incomplete distension. Vascular/Lymphatic: Multiple surgical clips in the retroperitoneum near the abdominal aorta. Findings compatible with aortobifemoral bypass procedure. Common femoral arteries are heavily calcified. Visceral artery calcifications. No suspicious lymphadenopathy. Reproductive: No gross abnormality to the prostate or seminal vesicles. Other: No free fluid. No free air. Postsurgical changes along the anterior abdominal wall from previous aortic surgery. Left inguinal hernia containing fat. Musculoskeletal: Compression deformity involving the L5 vertebral body with sclerosis along the superior endplate. This is new since 2008 but probably represents a chronic deformity. There is extensive facet arthropathy at L4-L5. Joint space narrowing and degenerative changes in the right hip joint. IMPRESSION: No CT findings to explain blood loss or anemia. Bilateral renal cysts. At least one renal lesion is indeterminate in the right kidney. This hyperdense structure could represent a hemorrhagic or proteinaceous cyst but indeterminate. Bilateral renal atrophy without hydronephrosis. Findings compatible with end-stage renal disease. Postsurgical changes compatible aortobifemoral bypass procedure. Mild emphysematous changes in lungs with a few indeterminate small pulmonary nodules. Largest measuring 4 mm. Consider noncontrast chest CT in 12 months due to underlying emphysematous disease. This recommendation follows the consensus statement: Guidelines for Management of Incidental Pulmonary Nodules Detected on CT Images:From the Fleischner Society 2017; published online before print (10.1148/[id number]). Age-indeterminate compression  fractures at C7 and L5. Electronically Signed   By: Markus Daft M.D.   On: 06/05/2016 13:37   Dg Shoulder Left  Result Date: 06/05/2016 CLINICAL DATA:  Left shoulder joint pain and discomfort for 2-3 months. No known injury.  EXAM: LEFT SHOULDER - 2+ VIEW COMPARISON:  Chest radiograph 06/04/2016 FINDINGS: No acute fracture or dislocation. Left upper lung zone appears clear. No abnormal soft tissue calcifications. IMPRESSION: No acute osseous abnormality Electronically Signed   By: Donavan Foil M.D.   On: 06/05/2016 13:35    ROS: As per HPI otherwise negative.   Physical Exam: Vitals:   06/05/16 1430 06/05/16 1500 06/05/16 1530 06/05/16 1535  BP: (!) 106/56 (!) 86/56 (!) 77/54 (!) 90/40  Pulse: (!) 52 (!) 52 (!) 56 60  Resp: 16 16 16 16   Temp:      TempSrc:      SpO2:      Weight:      Height:         General: Well developed, well nourished, in no acute distress. Head: Normocephalic, atraumatic, sclera non-icteric, mucus membranes are moist Neck: Supple. JVD not elevated. Lungs: Clear bilaterally to auscultation without wheezes, rales, or rhonchi. Breathing is unlabored. Heart: RRR with S1 S2. No murmurs, rubs, or gallops appreciated. Abdomen: Soft, non-tender, non-distended with normoactive bowel sounds. No rebound/guarding. No obvious abdominal masses. M-S:  Strength and tone appear normal for age. Lower extremities:without edema or ischemic changes, no open wounds  Neuro: Alert and oriented X 3. Moves all extremities spontaneously. Psych:  Responds to questions appropriately with a normal affect. Dialysis Access: RUA AVF cannulated at present  Dialysis Orders: [facility] MWF 4 hours 400/800 90kg 2.0 K/2.0 Ca Heparin 5000 units IV q tx Venofer 50 mg IV q week (last dose 05/23/16 Last Fe 103 Tsat 37% 05/16/16) Calcitriol 1.75 mcg PO q treatment (last PTH 757 05/16/16) Renvela 800 mg 3 tabs TID AC Sensipar 30 mg PO Q hs  Assessment/Plan: 1. Progressive  DOE: per primary-appear comfortable on RA. No LE edema, No evidence of     volume overload. CT chest/pelvis showed Mild emphysematous changes in lungs with a        few indeterminate small pulmonary nodules. Recommend F/U 12 months. 2.  LUE pain/paresthesia: Seen per VVS. Per arterial duplex, arteries in L hand patent.           Patient to F/U with Dr. Oneida Alar as OP.  RLE duplex negative.  3.  ESRD - MWF HD today off schedule. K+ 4.2. Using AVF in right arm 4.  Hypertension/volume  - On coreg 12.5 mg BID per OP med list. Pre wt 91.9 kg UFG           1500. BP soft but rec'd coreg this AM.  5.   Anemia  - HGB was 7.7 on AM labs. Was repeated-HGB 9.7. No OP ESA. Will give                Aranesp 40 mcg IV today.  6.   Metabolic bone disease -  Continue binders, VDRA, cinacalcet.  7.   Nutrition - renal/carb mod diet. Renal vit/nrepro 8.   Hypothyroidism: per primary 9.   DM: per primary  Disposition: Probably DC in AM. Would be ideal if could be discharged in AM and go to OP unit tomorrow otherwise would need HD in [place]. Owens Shark, NP-C 06/05/2016, 3:44 PM  [facility] Beeper [phone]  Patient seen and examined, agree with above note with above modifications. Presented with left hand pain of unclear etiology- seems that can be handled as OP- not sure why progressive SOB- did not have much volume on according to weights - no pulm  edema- just emphysematous change.  Will see what plan is tomorrow to give plan for HD  Corliss Parish, MD 06/05/2016

## 2016-06-05 NOTE — Consult Note (Signed)
[facility]  Subjective  - hand better but still some numbness   Objective (!) 108/48 (!) 57 98 F (36.7 C) (Oral) 18 98% No intake or output data in the 24 hours ending 06/05/16 1111  Left hand pink warm  DATA: arterial duplex all arteries to left hand patent  Assessment/Planning: 1.  Left hand well perfused doubt numbness symptoms are related to vascular etiology.  Will arrange follow up visit with me in a few weeks.  If he is still having symptoms will consider ligating fistula for steal  2.  PAD: ? Mildly symptomatic " weak legs"  ABIs pending but most likely conservative management since patient has multiple co morbidities and is much more limited by his dyspnea on exertion rather than muscular fatigue.  Will also review as outpt.  Ruta Hinds 06/05/2016 11:11 AM --  Laboratory Lab Results:  Recent Labs  06/04/16 1248 06/05/16 0705  WBC 8.8 8.0  HGB 10.4* 7.7*  HCT 32.3* 23.5*  PLT 137* PLATELET CLUMPS NOTED ON SMEAR, UNABLE TO ESTIMATE   BMET  Recent Labs  06/04/16 1248 06/05/16 0705  NA 140 142  K 4.4 4.2  CL 97* 100*  CO2 26 27  GLUCOSE 215* 85  BUN 42* 51*  CREATININE 8.64* 9.83*  CALCIUM 8.4* 8.1*    COAG No results found for: INR, PROTIME No results found for: PTT

## 2016-06-05 NOTE — Progress Notes (Signed)
Spoke with [employer] PA, patient's BP now 116/49 currently.  Patient remains asymptomatic.  PA wants to monitor BP overnight and d/c solucortef for now.  Primary Rn Colletta Maryland updated.  Sanda Linger

## 2016-06-06 ENCOUNTER — Observation Stay (HOSPITAL_COMMUNITY): Payer: Medicare Other

## 2016-06-06 ENCOUNTER — Observation Stay (HOSPITAL_BASED_OUTPATIENT_CLINIC_OR_DEPARTMENT_OTHER): Payer: Medicare Other

## 2016-06-06 DIAGNOSIS — R0600 Dyspnea, unspecified: Secondary | ICD-10-CM | POA: Diagnosis not present

## 2016-06-06 DIAGNOSIS — Z992 Dependence on renal dialysis: Secondary | ICD-10-CM

## 2016-06-06 DIAGNOSIS — I10 Essential (primary) hypertension: Secondary | ICD-10-CM

## 2016-06-06 DIAGNOSIS — R0609 Other forms of dyspnea: Secondary | ICD-10-CM | POA: Diagnosis not present

## 2016-06-06 DIAGNOSIS — I2510 Atherosclerotic heart disease of native coronary artery without angina pectoris: Secondary | ICD-10-CM | POA: Diagnosis not present

## 2016-06-06 DIAGNOSIS — N186 End stage renal disease: Secondary | ICD-10-CM

## 2016-06-06 DIAGNOSIS — M25512 Pain in left shoulder: Secondary | ICD-10-CM | POA: Diagnosis not present

## 2016-06-06 DIAGNOSIS — R202 Paresthesia of skin: Secondary | ICD-10-CM | POA: Diagnosis not present

## 2016-06-06 DIAGNOSIS — D649 Anemia, unspecified: Secondary | ICD-10-CM | POA: Diagnosis not present

## 2016-06-06 DIAGNOSIS — R0602 Shortness of breath: Secondary | ICD-10-CM | POA: Diagnosis not present

## 2016-06-06 LAB — COMPREHENSIVE METABOLIC PANEL
ALT: 14 U/L — ABNORMAL LOW (ref 17–63)
AST: 26 U/L (ref 15–41)
Albumin: 2.7 g/dL — ABNORMAL LOW (ref 3.5–5.0)
Alkaline Phosphatase: 93 U/L (ref 38–126)
Anion gap: 9 (ref 5–15)
BUN: 16 mg/dL (ref 6–20)
CO2: 30 mmol/L (ref 22–32)
Calcium: 8.2 mg/dL — ABNORMAL LOW (ref 8.9–10.3)
Chloride: 100 mmol/L — ABNORMAL LOW (ref 101–111)
Creatinine, Ser: 4.9 mg/dL — ABNORMAL HIGH (ref 0.61–1.24)
GFR calc Af Amer: 12 mL/min — ABNORMAL LOW (ref >60)
GFR calc non Af Amer: 11 mL/min — ABNORMAL LOW (ref >60)
Glucose, Bld: 112 mg/dL — ABNORMAL HIGH (ref 65–99)
Potassium: 3.3 mmol/L — ABNORMAL LOW (ref 3.5–5.1)
Sodium: 139 mmol/L (ref 135–145)
Total Bilirubin: 0.5 mg/dL (ref 0.3–1.2)
Total Protein: 5.2 g/dL — ABNORMAL LOW (ref 6.5–8.1)

## 2016-06-06 LAB — ECHOCARDIOGRAM COMPLETE
Ao-asc: 33 cm
E decel time: 320 msec
E/e' ratio: 13.18
FS: 45 % — AB (ref 28–44)
Height: 71 in
IVS/LV PW RATIO, ED: 0.96
LA ID, A-P, ES: 40 mm
LA diam end sys: 40 mm
LA diam index: 1.84 cm/m2
LA vol A4C: 57.1 ml
LA vol index: 28.2 mL/m2
LA vol: 61.2 mL
LV E/e' medial: 13.18
LV E/e'average: 13.18
LV PW d: 10.6 mm — AB (ref 0.6–1.1)
LV e' LATERAL: 7.51 cm/s
LVOT SV: 100 mL
LVOT VTI: 28.8 cm
LVOT area: 3.46 cm2
LVOT diameter: 21 mm
LVOT peak grad rest: 7 mmHg
LVOT peak vel: 132 cm/s
Lateral S' vel: 12 cm/s
MV Dec: 320
MV Peak grad: 4 mmHg
MV pk A vel: 101 m/s
MV pk E vel: 99 m/s
RV sys press: 26 mmHg
Reg peak vel: 240 cm/s
TAPSE: 33.3 mm
TDI e' lateral: 7.51
TDI e' medial: 6.31
TR max vel: 240 cm/s
Weight: 3256 oz

## 2016-06-06 LAB — CBC
HCT: 28.8 % — ABNORMAL LOW (ref 39.0–52.0)
Hemoglobin: 9.4 g/dL — ABNORMAL LOW (ref 13.0–17.0)
MCH: 34.8 pg — ABNORMAL HIGH (ref 26.0–34.0)
MCHC: 32.6 g/dL (ref 30.0–36.0)
MCV: 106.7 fL — ABNORMAL HIGH (ref 78.0–100.0)
Platelets: 141 10*3/uL — ABNORMAL LOW (ref 150–400)
RBC: 2.7 MIL/uL — ABNORMAL LOW (ref 4.22–5.81)
RDW: 15.8 % — ABNORMAL HIGH (ref 11.5–15.5)
WBC: 6.5 10*3/uL (ref 4.0–10.5)

## 2016-06-06 LAB — CORTISOL: Cortisol, Plasma: 6.4 ug/dL

## 2016-06-06 LAB — FOLATE RBC
Folate, Hemolysate: >620 ng/mL
Folate, RBC: >2394 ng/mL (ref >498)
Hematocrit: 25.9 % — ABNORMAL LOW (ref 37.5–51.0)

## 2016-06-06 LAB — GLUCOSE, CAPILLARY
Glucose-Capillary: 62 mg/dL — ABNORMAL LOW (ref 65–99)
Glucose-Capillary: 83 mg/dL (ref 65–99)
Glucose-Capillary: 172 mg/dL — ABNORMAL HIGH (ref 65–99)
Glucose-Capillary: 101 mg/dL — ABNORMAL HIGH (ref 65–99)

## 2016-06-06 MED ORDER — TECHNETIUM TC 99M DIETHYLENETRIAME-PENTAACETIC ACID: 31.3000 | Freq: Once | INTRAVENOUS | Status: DC | PRN

## 2016-06-06 MED ORDER — SODIUM CHLORIDE 0.9 % IV SOLN: 100.0000 mL | INTRAVENOUS | Status: DC | PRN

## 2016-06-06 MED ORDER — ALTEPLASE 2 MG IJ SOLR: 2.0000 mg | Freq: Once | INTRAMUSCULAR | Status: DC | PRN

## 2016-06-06 MED ORDER — TECHNETIUM TO 99M ALBUMIN AGGREGATED
4.1000 | Freq: Once | INTRAVENOUS | Status: AC | PRN
  Administered 2016-06-06: 4 via INTRAVENOUS

## 2016-06-06 MED ORDER — INSULIN GLARGINE 100 UNIT/ML ~~LOC~~ SOLN: 8.0000 [IU] | Freq: Every morning | SUBCUTANEOUS | Status: DC

## 2016-06-06 MED ORDER — HEPARIN SODIUM (PORCINE) 1000 UNIT/ML DIALYSIS: 3750.0000 [IU] | INTRAMUSCULAR | Status: DC | PRN

## 2016-06-06 MED ORDER — PENTAFLUOROPROP-TETRAFLUOROETH EX AERO: 1.0000 "application " | INHALATION_SPRAY | CUTANEOUS | Status: DC | PRN

## 2016-06-06 MED ORDER — LIDOCAINE HCL (PF) 1 % IJ SOLN: 5.0000 mL | INTRAMUSCULAR | Status: DC | PRN

## 2016-06-06 MED ORDER — LIDOCAINE-PRILOCAINE 2.5-2.5 % EX CREA: 1.0000 "application " | TOPICAL_CREAM | CUTANEOUS | Status: DC | PRN

## 2016-06-06 MED ORDER — HEPARIN SODIUM (PORCINE) 1000 UNIT/ML DIALYSIS: 1000.0000 [IU] | INTRAMUSCULAR | Status: DC | PRN

## 2016-06-06 NOTE — Care Management Obs Status (Signed)
[place] NOTIFICATION   Patient Details  Name: CIAN COSTANZO MRN: [id number] Date of Birth: 11-16-1940   Medicare Observation Status Notification Given:  Yes    Bethena Roys, RN 06/06/2016, 11:30 AM

## 2016-06-06 NOTE — Care Management Note (Addendum)
Case Management Note  Patient Details  Name: [name] MRN: [id number] Date of Birth: Jun 23, 1941  Subjective/Objective:  Pt presented  For SOB. Hx ESRD on HD MWF. Plan for d/c home post HD.  Pt is from home with wife. Per pt he drives self to HD Appointments.                Action/Plan: CM did speak with pt in regards to Outpatient PT and he is agreeable to services. CM did make ambulatory  referral via EPIC. No further needs from CM at this time.   Expected Discharge Date:                  Expected Discharge Plan:  Home/Self Care  In-House Referral:  NA  Discharge planning Services  CM Consult  Post Acute Care Choice:  NA Choice offered to:  NA  DME Arranged:  N/A DME Agency:  NA  HH Arranged:  NA HH Agency:  NA  Status of Service:  Completed, signed off  If discussed at [place] of Stay Meetings, dates discussed:    Additional Comments: [id number] 06-08-16 Outpatient Rehab referral sent to [facility] on [address]. Office to call patient. [name] [phone]  Bethena Roys, RN 06/06/2016, 11:37 AM

## 2016-06-06 NOTE — Procedures (Signed)
Patient was seen on dialysis and the procedure was supervised.  BFR 400  Via AVF BP is  126/48.   Patient appears to be tolerating treatment well  [name] A 06/06/2016

## 2016-06-06 NOTE — Progress Notes (Signed)
Patient asked RN to check his CBG because he had broke out into a "cold sweat." CBG was 62; patient was given a Nepro drink and a pack of graham crackers; CBG 83 on recheck. Patient is currently drinking another Nepro drink. Will continue to monitor.

## 2016-06-06 NOTE — Progress Notes (Signed)
Inpatient Diabetes Program Recommendations  AACE/ADA: New Consensus Statement on Inpatient Glycemic Control (2015)  Target Ranges:  Prepandial:   less than 140 mg/dL      Peak postprandial:   less than 180 mg/dL (1-2 hours)      Critically ill patients:  140 - 180 mg/dL   Lab Results  Component Value Date   GLUCAP 172 (H) 06/06/2016   HGBA1C 7.2 (H) 01/10/2016    Review of Glycemic Control Results for [name], [name] (MRN [id number]) as of 06/06/2016 11:45  Ref. Range 06/05/2016 07:45 06/05/2016 11:17 06/05/2016 21:21 06/06/2016 03:25 06/06/2016 04:08 06/06/2016 11:10  Glucose-Capillary Latest Ref Range: 65 - 99 mg/dL 82 123 (H) 209 (H) 62 (L) 83 172 (H)   Diabetes history: DM2 Outpatient Diabetes medications: Lantus 8 units qd+ Novolog 5-8 units tid meal coverage+Actos 30 qd Current orders for Inpatient glycemic control: Lantus 8 units+0-15 units tid+0-5 units hs  Inpatient Diabetes Program Recommendations:  Reviewed CBGs. Please consider decrease in Novolog correction tid to sensitive 0-9 units tid.  Thank you, Nani Gasser. Terresa Marlett, RN, MSN, CDE Inpatient Glycemic Control Team Team Pager [phone] (8am-5pm) 06/06/2016 11:48 AM

## 2016-06-06 NOTE — Progress Notes (Signed)
Patient arrived to unit per bed.  Reviewed treatment plan and this RN agrees.  Report received from bedside RN, Janett Billow.  Consent verified.  Patient A & o x 4. Lung sounds clear to ausculation in all fields. Generalized edema. Cardiac: NSR.  Prepped RUAVF with alcohol and cannulated with two 15 gauge needles.  Pulsation of blood noted.  Flushed access well with saline per protocol.  Connected and secured lines and initiated tx at 1152.  UF goal of 1500 mL and net fluid removal of 1000 mL.  Will continue to monitor.

## 2016-06-06 NOTE — Discharge Summary (Signed)
PATIENT DETAILS Name: [name] Age: 75 y.o. Sex: male Date of Birth: 1940/12/30 MRN: [id number]. Admitting Physician: Vianne Bulls, MD NHA:[id number],BFXO J, MD  Admit Date: 06/04/2016 Discharge date: 06/06/2016  Recommendations for Outpatient Follow-up:  1. Follow up with PCP in 1-2 weeks 2. Ensure follow-up with vascular surgery if patient's left arm/left upper extremity symptoms continue 3. Has small pulmonary nodules, etiology recommending a repeat CT chest in 12 months. 4. Please obtain BMP/CBC in one week 5. Please follow up on the following pending results:  Admitted From:  Home  Disposition: [place]: No  Equipment/Devices: None  Discharge Condition: Stable  CODE STATUS: FULL CODE  Diet recommendation:  Heart Healthy / Carb Modified   Brief Summary: See H&P, Labs, Consult and Test reports for all details in brief, patient  a 75 y.o. male with medical history significant for end-stage renal disease on hemodialysis, insulin-dependent diabetes mellitus, hypertension, and coronary artery disease who presented to the ED with complaints of dyspnea on exertion, and episodes of numbness/cold left upper extremity. He was subsequently admitted for further evaluation and treatment  Brief Hospital Course: Exertional dyspnea: Etiology not evident, seems to be episodic as well. I ambulated this gentleman around the hall today, he did well with very minimal dyspnea. O2 saturations were stable throughout. CT chest did not show any acute abnormalities, did show some mild emphysematous changes. VQ scan was of low probability. Right lower extremity Doppler was negative for DVT. 2-D echocardiogram showed preserved ejection fraction with just grade 1 diastolic dysfunction. He does have some mild anemia, likely related to underlying ESRD-could be causing him to have exertional dyspnea. Since no obvious etiology evident, in spite of an extensive workup-his symptoms are not  persistent/consistent and seemed to wax and wane at times-I suspect he is stable enough to pursue further work up in the outpatient setting.  Left thumb numbness/paresthesias/coolness of left forearm:? Etiology. Per patient this has been going on intermittently, and he thinks it is related to a particular position he sleeps at night with. Patient was seen by vascular surgery, an arterial Doppler of the left upper extremity showed patent vessels. Vascular surgery has no further recommendations, except for outpatient follow-up. If symptoms continue, vascular surgery suggest that we could consider ligating fistula for steel symptoms. Have recommended to patient that he needs to follow up with vascular surgery for further continued care monitoring.  Insulin-dependent diabetes: Continue 8 units of Lantus and SSI. We'll discontinue Actos for now. He did have one episode of mild hypoglycemia earlier this morning, but subsequent readings have been more than 100. Follow-up with patient's PCP for further optimization. A1c has decreased to 6.4, can afford to allow some mild permissive hyperglycemia-if episodes of hypoglycemia continue  ESRD: Nephrology consulted during this hospital stay, on hemodialysis MWF. Patient to follow-up at his usual hemodialysis center at his usual time.  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 Being managed by nephrology.  Hypertension: Did have soft blood pressures during this hospital stay, we'll temporarily discontinue Coreg for now-nephrology will need to consider increasing his dry weight  Hypothyroidism: Continue Synthroid  Multiple lung nodules: Seen incidentally on CT chest-repeat CT chest in one year here at the recommendation of radiology. Defer to PCP  Procedures/Studies: Echo 9/13>> EF 32-91%, grade 1 diastolic dysfunction  Discharge Diagnoses:  Principal Problem:   Exertional dyspnea Active Problems:   Insulin  dependent diabetes mellitus ([place])  Essential hypertension   Hypothyroidism   OSA (obstructive sleep apnea)   ESRD on dialysis ([facility])   Macrocytic anemia   Thrombocytopenia ([facility])   Arm paresthesia, left   Dyspnea on exertion   Tenderness of right calf   Dyspnea   Discharge Instructions:  Activity:  As tolerated with Full fall precautions use walker/cane & assistance as needed  Discharge Instructions    Ambulatory referral to Physical Therapy    Complete by:  As directed    Pt will need evaluation and treatment. Pt has had previous falls. Please see PT notes. Pt admitted for SOB. Pt has HX ESRD on HD (MWF) Schedule at noon. He would like morning appointments in order to make it to HD in a timely manner. Thanks   Call MD for:  extreme fatigue    Complete by:  As directed    Call MD for:  persistant dizziness or light-headedness    Complete by:  As directed    Call MD for:  persistant nausea and vomiting    Complete by:  As directed    Call MD for:  redness, tenderness, or signs of infection (pain, swelling, redness, odor or green/yellow discharge around incision site)    Complete by:  As directed    Call MD for:  severe uncontrolled pain    Complete by:  As directed    Diet - low sodium heart healthy    Complete by:  As directed    Diet Carb Modified    Complete by:  As directed    Discharge instructions    Complete by:  As directed    Follow with Primary MD  Elby Showers, MD  and other consultant's as instructed your Hospitalist MD  Please get a complete blood count and chemistry panel checked by your Primary MD at your next visit, and again as instructed by your Primary MD.  Get Medicines reviewed and adjusted: Please take all your medications with you for your next visit with your Primary MD  Laboratory/radiological data: Please request your Primary MD to go over all hospital tests and procedure/radiological results at the follow up, please ask your Primary MD to get  all Hospital records sent to his/her office.  In some cases, they will be blood work, cultures and biopsy results pending at the time of your discharge. Please request that your primary care M.D. follows up on these results.  Also Note the following: If you experience worsening of your admission symptoms, develop shortness of breath, life threatening emergency, suicidal or homicidal thoughts you must seek medical attention immediately by calling 911 or calling your MD immediately  if symptoms less severe.  You must read complete instructions/literature along with all the possible adverse reactions/side effects for all the Medicines you take and that have been prescribed to you. Take any new Medicines after you have completely understood and accpet all the possible adverse reactions/side effects.   Do not drive when taking Pain medications or sleeping medications (Benzodaizepines)  Do not take more than prescribed Pain, Sleep and Anxiety Medications. It is not advisable to combine anxiety,sleep and pain medications without talking with your primary care practitioner  Special Instructions: If you have smoked or chewed Tobacco  in the last 2 yrs please stop smoking, stop any regular Alcohol  and or any Recreational drug use.  Wear Seat belts while driving.  Please note: You were cared for by a hospitalist during your hospital stay. Once you are discharged, your primary care  physician will handle any further medical issues. Please note that NO REFILLS for any discharge medications will be authorized once you are discharged, as it is imperative that you return to your primary care physician (or establish a relationship with a primary care physician if you do not have one) for your post hospital discharge needs so that they can reassess your need for medications and monitor your lab values.   Increase activity slowly    Complete by:  As directed        Medication List    STOP taking these  medications   carvedilol 12.5 MG tablet Commonly known as:  COREG   pioglitazone 30 MG tablet Commonly known as:  ACTOS     TAKE these medications   acetaminophen 500 MG tablet Commonly known as:  TYLENOL Take 500 mg by mouth every 6 (six) hours as needed (for pain).   allopurinol 100 MG tablet Commonly known as:  ZYLOPRIM Take 100 mg by mouth daily.   aspirin 325 MG tablet Take 650 mg by mouth daily.   atorvastatin 40 MG tablet Commonly known as:  LIPITOR Take 1 tablet (40 mg total) by mouth daily.   clobetasol 0.05 % external solution Commonly known as:  TEMOVATE apply to affected area ON THE SCALP UP TO 2 TIMES A DAY AS NEEDED...  (REFER TO PRESCRIPTION NOTES).   donepezil 10 MG tablet Commonly known as:  ARICEPT Take 1 tablet (10 mg total) by mouth at bedtime.   FLUOCINOLONE ACETONIDE SCALP 0.01 % Oil apply to affected area ON SCALP EVERY NIGHT AT BEDTIME, WASH UPON AWAKENING   fluticasone 0.05 % cream Commonly known as:  CUTIVATE apply to affected area ON FACE UP TO 2 TIMES A DAY AS NEEDED   fluticasone 50 MCG/ACT nasal spray Commonly known as:  FLONASE Place 1 spray into both nostrils daily. What changed:  when to take this  reasons to take this   FREESTYLE FREEDOM LITE w/Device Kit Use to check blood sugar 2 times per day dx code E11.65   freestyle lancets Use as instructed to check blood sugar 2 times per day dx code E11.65   glucose blood test strip Commonly known as:  ONETOUCH VERIO Use to check blood sugar 2 times per day. Dx code E11.65   insulin aspart 100 UNIT/ML injection Commonly known as:  novoLOG Inject 10 Units into the skin 3 (three) times daily before meals. What changed:  how much to take  when to take this  additional instructions   insulin glargine 100 UNIT/ML injection Commonly known as:  LANTUS Inject 0.08 mLs (8 Units total) into the skin every morning.   ketoconazole 2 % shampoo Commonly known as:  NIZORAL Apply 1  application topically 2 (two) times a week.   levothyroxine 50 MCG tablet Commonly known as:  SYNTHROID, LEVOTHROID Take 1 tablet (50 mcg total) by mouth daily.   lidocaine-prilocaine cream Commonly known as:  EMLA Apply 1 application topically once. AS DIRECTED   loratadine 10 MG tablet Commonly known as:  CLARITIN Take 10 mg by mouth daily as needed for allergies.   metroNIDAZOLE 0.75 % gel Commonly known as:  METROGEL APPLY TO AFFECTED AREA ON SKIN  ON FACE TWICE DAILY   mupirocin ointment 2 % Commonly known as:  BACTROBAN Use as directed to affected areas   Naftifine HCl 2 % Crea Commonly known as:  NAFTIN Apply to feet daily for fungal infection   RENVELA 800 MG tablet Generic drug:  sevelamer carbonate Take 800-2,400 mg by mouth See admin instructions. 1,600-2,400 mg with breakfast then 1,600 mg with lunch then 2,400 mg with dinner (evening meal) and 800 mg with each snack   SENSIPAR 30 MG tablet Generic drug:  cinacalcet Take 30 mg by mouth daily.   simvastatin 20 MG tablet Commonly known as:  ZOCOR Take 20 mg by mouth at bedtime.   triamcinolone ointment 0.1 % Commonly known as:  KENALOG Apply 1 application topically 2 (two) times daily. Use between thighs      Follow-up Information    Ruta Hinds, MD Follow up in 3 week(s).   Specialties:  Vascular Surgery, Cardiology Why:  Our office will call you to arrange an appointment  Contact information: [place] [address] [phone]        [facility] .   Specialty:  Rehabilitation Why:  Office to call you to schedule an appointment. If office has not called within the next 2-3 business days please call office to schedule an appointment. Thanks Contact information: [address] [id number] mc Thrall Yale, MD. Schedule an appointment as soon as possible for a visit in 2 week(s).     Specialty:  Internal Medicine Contact information: [address] [place] [address] [phone]        Hemodialysis center .   Contact information: Follow at your usual schedule         Allergies  Allergen Reactions  . Penicillins Rash    Has patient had a PCN reaction causing immediate rash, facial/tongue/throat swelling, SOB or lightheadedness with hypotension: Yes Has patient had a PCN reaction causing severe rash involving mucus membranes or skin necrosis: Yes Has patient had a PCN reaction that required hospitalization: No Has patient had a PCN reaction occurring within the last 10 years: No If all of the above answers are "NO", then may proceed with Cephalosporin use.     Consultations:   nephrology  Other Procedures/Studies: Ct Abdomen Pelvis Wo Contrast  Result Date: 06/05/2016 CLINICAL DATA:  75 year old with history of aortic surgery and acute anemia. History of end-stage renal disease. EXAM: CT CHEST, ABDOMEN AND PELVIS WITHOUT CONTRAST TECHNIQUE: Multidetector CT imaging of the chest, abdomen and pelvis was performed following the standard protocol without IV contrast. COMPARISON:  Abdominal CT 01/27/2007 FINDINGS: CT CHEST FINDINGS Cardiovascular: Diffuse atherosclerotic calcifications involving the thoracic aorta. The mid ascending thoracic aorta measures up to 3.7 cm. Origin of the great vessels are heavily calcified. Diffuse coronary artery calcifications. Descending thoracic aorta measures up to 3.0 cm. Mediastinum/Nodes: Calcifications in the mediastinum and right hilum compatible with old granulomatous disease. No suspicious lymphadenopathy. Largest mediastinal lymph node is in the pretracheal region measuring 1.0 cm in short axis. Lungs/Pleura: Trachea and mainstem bronchi are patent. Mild emphysematous disease in the upper lungs. Few pleural-based densities at the right lung apex probably represent scarring. Irregular nodular density in the  lingula roughly measures 4 mm on sequence 6, image 93. Punctate nodule in the left lower lobe on sequence 3, image 125. No large areas of lung consolidation. No significant pleural fluid. Subtle peripheral densities in the right lower lobe. Musculoskeletal: Compression deformity involving the C7 vertebral body is age-indeterminate. CT ABDOMEN PELVIS FINDINGS Hepatobiliary: 0.8 cm low-density structure in the right hepatic lobe on sequence 7, image 135. Additional small low-density structures near the hepatic dome. These low-density structures are indeterminate but could represent small cysts. Normal  appearance of the gallbladder. No biliary dilatation. Pancreas: Normal appearance of the pancreas without inflammation or duct dilatation. Spleen: Normal appearance of spleen without enlargement. Adrenals/Urinary Tract: Normal appearance of the adrenal glands. Numerous bilateral exophytic renal lesions. Most of these lesions are low-density and suggestive for renal cysts. One structures is hyperdense in the right kidney interpolar region measuring up to 1.1 cm and probably new from the previous examination. These renal cysts have progressed from the previous examination. In addition, there has been progressive bilateral renal atrophy, left side greater than right. No hydronephrosis. Small calcification in left kidney could be vascular in etiology. Normal appearance of the urinary bladder. Stomach/Bowel: Diverticulosis in the sigmoid colon. No evidence for bowel obstruction. Diverticulosis in the right colon. Difficult to exclude asymmetric small bowel wall thickening on sequence 7, image 196. These findings are nonspecific and could be related to incomplete distension. Vascular/Lymphatic: Multiple surgical clips in the retroperitoneum near the abdominal aorta. Findings compatible with aortobifemoral bypass procedure. Common femoral arteries are heavily calcified. Visceral artery calcifications. No suspicious  lymphadenopathy. Reproductive: No gross abnormality to the prostate or seminal vesicles. Other: No free fluid. No free air. Postsurgical changes along the anterior abdominal wall from previous aortic surgery. Left inguinal hernia containing fat. Musculoskeletal: Compression deformity involving the L5 vertebral body with sclerosis along the superior endplate. This is new since 2008 but probably represents a chronic deformity. There is extensive facet arthropathy at L4-L5. Joint space narrowing and degenerative changes in the right hip joint. IMPRESSION: No CT findings to explain blood loss or anemia. Bilateral renal cysts. At least one renal lesion is indeterminate in the right kidney. This hyperdense structure could represent a hemorrhagic or proteinaceous cyst but indeterminate. Bilateral renal atrophy without hydronephrosis. Findings compatible with end-stage renal disease. Postsurgical changes compatible aortobifemoral bypass procedure. Mild emphysematous changes in lungs with a few indeterminate small pulmonary nodules. Largest measuring 4 mm. Consider noncontrast chest CT in 12 months due to underlying emphysematous disease. This recommendation follows the consensus statement: Guidelines for Management of Incidental Pulmonary Nodules Detected on CT Images:From the Fleischner Society 2017; published online before print (10.1148/[id number]). Age-indeterminate compression fractures at C7 and L5. Electronically Signed   By: Markus Daft M.D.   On: 06/05/2016 13:37   Dg Chest 2 View  Result Date: 06/04/2016 CLINICAL DATA:  74 year old male with shortness of breath, central chest pressure for 1 week. Congestion. Initial encounter. Former smoker EXAM: CHEST  2 VIEW COMPARISON:  02/18/2016 and earlier. FINDINGS: Calcified aortic atherosclerosis. Normal cardiac size and mediastinal contours. Visualized tracheal air column is within normal limits. Stable lung volumes, at the upper limits of normal. Mild chronic  increased interstitial markings in both lungs appear stable. No pneumothorax, pulmonary edema, pleural effusion or acute pulmonary opacity. Stable epigastric surgical clips. Osteopenia. No acute osseous abnormality identified. IMPRESSION: 1. No acute cardiopulmonary abnormality. Chronic interstitial pulmonary changes suspected. 2.  Calcified aortic atherosclerosis. Electronically Signed   By: Genevie Ann M.D.   On: 06/04/2016 13:31   Ct Chest Wo Contrast  Result Date: 06/05/2016 CLINICAL DATA:  75 year old with history of aortic surgery and acute anemia. History of end-stage renal disease. EXAM: CT CHEST, ABDOMEN AND PELVIS WITHOUT CONTRAST TECHNIQUE: Multidetector CT imaging of the chest, abdomen and pelvis was performed following the standard protocol without IV contrast. COMPARISON:  Abdominal CT 01/27/2007 FINDINGS: CT CHEST FINDINGS Cardiovascular: Diffuse atherosclerotic calcifications involving the thoracic aorta. The mid ascending thoracic aorta measures up to 3.7 cm. Origin of the great vessels  are heavily calcified. Diffuse coronary artery calcifications. Descending thoracic aorta measures up to 3.0 cm. Mediastinum/Nodes: Calcifications in the mediastinum and right hilum compatible with old granulomatous disease. No suspicious lymphadenopathy. Largest mediastinal lymph node is in the pretracheal region measuring 1.0 cm in short axis. Lungs/Pleura: Trachea and mainstem bronchi are patent. Mild emphysematous disease in the upper lungs. Few pleural-based densities at the right lung apex probably represent scarring. Irregular nodular density in the lingula roughly measures 4 mm on sequence 6, image 93. Punctate nodule in the left lower lobe on sequence 3, image 125. No large areas of lung consolidation. No significant pleural fluid. Subtle peripheral densities in the right lower lobe. Musculoskeletal: Compression deformity involving the C7 vertebral body is age-indeterminate. CT ABDOMEN PELVIS FINDINGS  Hepatobiliary: 0.8 cm low-density structure in the right hepatic lobe on sequence 7, image 135. Additional small low-density structures near the hepatic dome. These low-density structures are indeterminate but could represent small cysts. Normal appearance of the gallbladder. No biliary dilatation. Pancreas: Normal appearance of the pancreas without inflammation or duct dilatation. Spleen: Normal appearance of spleen without enlargement. Adrenals/Urinary Tract: Normal appearance of the adrenal glands. Numerous bilateral exophytic renal lesions. Most of these lesions are low-density and suggestive for renal cysts. One structures is hyperdense in the right kidney interpolar region measuring up to 1.1 cm and probably new from the previous examination. These renal cysts have progressed from the previous examination. In addition, there has been progressive bilateral renal atrophy, left side greater than right. No hydronephrosis. Small calcification in left kidney could be vascular in etiology. Normal appearance of the urinary bladder. Stomach/Bowel: Diverticulosis in the sigmoid colon. No evidence for bowel obstruction. Diverticulosis in the right colon. Difficult to exclude asymmetric small bowel wall thickening on sequence 7, image 196. These findings are nonspecific and could be related to incomplete distension. Vascular/Lymphatic: Multiple surgical clips in the retroperitoneum near the abdominal aorta. Findings compatible with aortobifemoral bypass procedure. Common femoral arteries are heavily calcified. Visceral artery calcifications. No suspicious lymphadenopathy. Reproductive: No gross abnormality to the prostate or seminal vesicles. Other: No free fluid. No free air. Postsurgical changes along the anterior abdominal wall from previous aortic surgery. Left inguinal hernia containing fat. Musculoskeletal: Compression deformity involving the L5 vertebral body with sclerosis along the superior endplate. This is new  since 2008 but probably represents a chronic deformity. There is extensive facet arthropathy at L4-L5. Joint space narrowing and degenerative changes in the right hip joint. IMPRESSION: No CT findings to explain blood loss or anemia. Bilateral renal cysts. At least one renal lesion is indeterminate in the right kidney. This hyperdense structure could represent a hemorrhagic or proteinaceous cyst but indeterminate. Bilateral renal atrophy without hydronephrosis. Findings compatible with end-stage renal disease. Postsurgical changes compatible aortobifemoral bypass procedure. Mild emphysematous changes in lungs with a few indeterminate small pulmonary nodules. Largest measuring 4 mm. Consider noncontrast chest CT in 12 months due to underlying emphysematous disease. This recommendation follows the consensus statement: Guidelines for Management of Incidental Pulmonary Nodules Detected on CT Images:From the Fleischner Society 2017; published online before print (10.1148/[id number]). Age-indeterminate compression fractures at C7 and L5. Electronically Signed   By: Markus Daft M.D.   On: 06/05/2016 13:37   Nm Pulmonary Perf And Vent  Result Date: 06/06/2016 CLINICAL DATA:  Shortness breath and chest pressure. EXAM: NUCLEAR MEDICINE VENTILATION - PERFUSION LUNG SCAN TECHNIQUE: Ventilation images were obtained in multiple projections using inhaled aerosol Tc-59mDTPA. Perfusion images were obtained in multiple projections after intravenous  injection of Tc-66mMAA. RADIOPHARMACEUTICALS:  31.3 mCi Technetium-977mTPA aerosol inhalation and 4.1 mCi Technetium-9925mA IV COMPARISON:  CT of the chest 06/05/2016 FINDINGS: Ventilation: No focal ventilation defect. Perfusion: No wedge shaped peripheral perfusion defects to suggest acute pulmonary embolism. IMPRESSION: Negative ventilation perfusion study. Electronically Signed   By: ChrSan MorelleD.   On: 06/06/2016 08:24   Dg Shoulder Left  Result Date:  06/05/2016 CLINICAL DATA:  Left shoulder joint pain and discomfort for 2-3 months. No known injury. EXAM: LEFT SHOULDER - 2+ VIEW COMPARISON:  Chest radiograph 06/04/2016 FINDINGS: No acute fracture or dislocation. Left upper lung zone appears clear. No abnormal soft tissue calcifications. IMPRESSION: No acute osseous abnormality Electronically Signed   By: KimDonavan FoilD.   On: 06/05/2016 13:35      TODAY-DAY OF DISCHARGE:  Subjective:   WilLaqueta Dueday has no headache,no chest abdominal pain,no new weakness ,feels much better wants to go home today.  Objective:   Blood pressure 128/64, pulse (!) 54, temperature 97.8 F (36.6 C), resp. rate 20, height 5' 11" (1.803 m), weight 91.9 kg (202 lb 9.6 oz), SpO2 98 %.  Intake/Output Summary (Last 24 hours) at 06/06/16 1737 Last data filed at 06/06/16 1455  Gross per 24 hour  Intake              240 ml  Output             1000 ml  Net             -760 ml   Filed Weights   06/06/16 0538 06/06/16 1143 06/06/16 1452  Weight: 92.3 kg (203 lb 8 oz) 92.9 kg (204 lb 12.9 oz) 91.9 kg (202 lb 9.6 oz)    Exam: Awake Alert, Oriented *3, No new F.N deficits, Normal affect [place].AT,PERRAL Supple Neck,No JVD, No cervical lymphadenopathy appriciated.  Symmetrical Chest wall movement, Good air movement bilaterally, CTAB RRR,No Gallops,Rubs or new Murmurs, No Parasternal Heave +ve B.Sounds, Abd Soft, Non tender, No organomegaly appriciated, No rebound -guarding or rigidity. No Cyanosis, Clubbing or edema, No new Rash or bruise   PERTINENT RADIOLOGIC STUDIES: Ct Abdomen Pelvis Wo Contrast  Result Date: 06/05/2016 CLINICAL DATA:  74 31ar old with history of aortic surgery and acute anemia. History of end-stage renal disease. EXAM: CT CHEST, ABDOMEN AND PELVIS WITHOUT CONTRAST TECHNIQUE: Multidetector CT imaging of the chest, abdomen and pelvis was performed following the standard protocol without IV contrast. COMPARISON:  Abdominal CT 01/27/2007  FINDINGS: CT CHEST FINDINGS Cardiovascular: Diffuse atherosclerotic calcifications involving the thoracic aorta. The mid ascending thoracic aorta measures up to 3.7 cm. Origin of the great vessels are heavily calcified. Diffuse coronary artery calcifications. Descending thoracic aorta measures up to 3.0 cm. Mediastinum/Nodes: Calcifications in the mediastinum and right hilum compatible with old granulomatous disease. No suspicious lymphadenopathy. Largest mediastinal lymph node is in the pretracheal region measuring 1.0 cm in short axis. Lungs/Pleura: Trachea and mainstem bronchi are patent. Mild emphysematous disease in the upper lungs. Few pleural-based densities at the right lung apex probably represent scarring. Irregular nodular density in the lingula roughly measures 4 mm on sequence 6, image 93. Punctate nodule in the left lower lobe on sequence 3, image 125. No large areas of lung consolidation. No significant pleural fluid. Subtle peripheral densities in the right lower lobe. Musculoskeletal: Compression deformity involving the C7 vertebral body is age-indeterminate. CT ABDOMEN PELVIS FINDINGS Hepatobiliary: 0.8 cm low-density structure in the right hepatic lobe on sequence 7, image 135. Additional  small low-density structures near the hepatic dome. These low-density structures are indeterminate but could represent small cysts. Normal appearance of the gallbladder. No biliary dilatation. Pancreas: Normal appearance of the pancreas without inflammation or duct dilatation. Spleen: Normal appearance of spleen without enlargement. Adrenals/Urinary Tract: Normal appearance of the adrenal glands. Numerous bilateral exophytic renal lesions. Most of these lesions are low-density and suggestive for renal cysts. One structures is hyperdense in the right kidney interpolar region measuring up to 1.1 cm and probably new from the previous examination. These renal cysts have progressed from the previous examination. In  addition, there has been progressive bilateral renal atrophy, left side greater than right. No hydronephrosis. Small calcification in left kidney could be vascular in etiology. Normal appearance of the urinary bladder. Stomach/Bowel: Diverticulosis in the sigmoid colon. No evidence for bowel obstruction. Diverticulosis in the right colon. Difficult to exclude asymmetric small bowel wall thickening on sequence 7, image 196. These findings are nonspecific and could be related to incomplete distension. Vascular/Lymphatic: Multiple surgical clips in the retroperitoneum near the abdominal aorta. Findings compatible with aortobifemoral bypass procedure. Common femoral arteries are heavily calcified. Visceral artery calcifications. No suspicious lymphadenopathy. Reproductive: No gross abnormality to the prostate or seminal vesicles. Other: No free fluid. No free air. Postsurgical changes along the anterior abdominal wall from previous aortic surgery. Left inguinal hernia containing fat. Musculoskeletal: Compression deformity involving the L5 vertebral body with sclerosis along the superior endplate. This is new since 2008 but probably represents a chronic deformity. There is extensive facet arthropathy at L4-L5. Joint space narrowing and degenerative changes in the right hip joint. IMPRESSION: No CT findings to explain blood loss or anemia. Bilateral renal cysts. At least one renal lesion is indeterminate in the right kidney. This hyperdense structure could represent a hemorrhagic or proteinaceous cyst but indeterminate. Bilateral renal atrophy without hydronephrosis. Findings compatible with end-stage renal disease. Postsurgical changes compatible aortobifemoral bypass procedure. Mild emphysematous changes in lungs with a few indeterminate small pulmonary nodules. Largest measuring 4 mm. Consider noncontrast chest CT in 12 months due to underlying emphysematous disease. This recommendation follows the consensus statement:  Guidelines for Management of Incidental Pulmonary Nodules Detected on CT Images:From the Fleischner Society 2017; published online before print (10.1148/radiol.[id number]). Age-indeterminate compression fractures at C7 and L5. Electronically Signed   By: Markus Daft M.D.   On: 06/05/2016 13:37   Dg Chest 2 View  Result Date: 06/04/2016 CLINICAL DATA:  75 year old male with shortness of breath, central chest pressure for 1 week. Congestion. Initial encounter. Former smoker EXAM: CHEST  2 VIEW COMPARISON:  02/18/2016 and earlier. FINDINGS: Calcified aortic atherosclerosis. Normal cardiac size and mediastinal contours. Visualized tracheal air column is within normal limits. Stable lung volumes, at the upper limits of normal. Mild chronic increased interstitial markings in both lungs appear stable. No pneumothorax, pulmonary edema, pleural effusion or acute pulmonary opacity. Stable epigastric surgical clips. Osteopenia. No acute osseous abnormality identified. IMPRESSION: 1. No acute cardiopulmonary abnormality. Chronic interstitial pulmonary changes suspected. 2.  Calcified aortic atherosclerosis. Electronically Signed   By: Genevie Ann M.D.   On: 06/04/2016 13:31   Ct Chest Wo Contrast  Result Date: 06/05/2016 CLINICAL DATA:  75 year old with history of aortic surgery and acute anemia. History of end-stage renal disease. EXAM: CT CHEST, ABDOMEN AND PELVIS WITHOUT CONTRAST TECHNIQUE: Multidetector CT imaging of the chest, abdomen and pelvis was performed following the standard protocol without IV contrast. COMPARISON:  Abdominal CT 01/27/2007 FINDINGS: CT CHEST FINDINGS Cardiovascular: Diffuse atherosclerotic calcifications involving  the thoracic aorta. The mid ascending thoracic aorta measures up to 3.7 cm. Origin of the great vessels are heavily calcified. Diffuse coronary artery calcifications. Descending thoracic aorta measures up to 3.0 cm. Mediastinum/Nodes: Calcifications in the mediastinum and right hilum  compatible with old granulomatous disease. No suspicious lymphadenopathy. Largest mediastinal lymph node is in the pretracheal region measuring 1.0 cm in short axis. Lungs/Pleura: Trachea and mainstem bronchi are patent. Mild emphysematous disease in the upper lungs. Few pleural-based densities at the right lung apex probably represent scarring. Irregular nodular density in the lingula roughly measures 4 mm on sequence 6, image 93. Punctate nodule in the left lower lobe on sequence 3, image 125. No large areas of lung consolidation. No significant pleural fluid. Subtle peripheral densities in the right lower lobe. Musculoskeletal: Compression deformity involving the C7 vertebral body is age-indeterminate. CT ABDOMEN PELVIS FINDINGS Hepatobiliary: 0.8 cm low-density structure in the right hepatic lobe on sequence 7, image 135. Additional small low-density structures near the hepatic dome. These low-density structures are indeterminate but could represent small cysts. Normal appearance of the gallbladder. No biliary dilatation. Pancreas: Normal appearance of the pancreas without inflammation or duct dilatation. Spleen: Normal appearance of spleen without enlargement. Adrenals/Urinary Tract: Normal appearance of the adrenal glands. Numerous bilateral exophytic renal lesions. Most of these lesions are low-density and suggestive for renal cysts. One structures is hyperdense in the right kidney interpolar region measuring up to 1.1 cm and probably new from the previous examination. These renal cysts have progressed from the previous examination. In addition, there has been progressive bilateral renal atrophy, left side greater than right. No hydronephrosis. Small calcification in left kidney could be vascular in etiology. Normal appearance of the urinary bladder. Stomach/Bowel: Diverticulosis in the sigmoid colon. No evidence for bowel obstruction. Diverticulosis in the right colon. Difficult to exclude asymmetric small  bowel wall thickening on sequence 7, image 196. These findings are nonspecific and could be related to incomplete distension. Vascular/Lymphatic: Multiple surgical clips in the retroperitoneum near the abdominal aorta. Findings compatible with aortobifemoral bypass procedure. Common femoral arteries are heavily calcified. Visceral artery calcifications. No suspicious lymphadenopathy. Reproductive: No gross abnormality to the prostate or seminal vesicles. Other: No free fluid. No free air. Postsurgical changes along the anterior abdominal wall from previous aortic surgery. Left inguinal hernia containing fat. Musculoskeletal: Compression deformity involving the L5 vertebral body with sclerosis along the superior endplate. This is new since 2008 but probably represents a chronic deformity. There is extensive facet arthropathy at L4-L5. Joint space narrowing and degenerative changes in the right hip joint. IMPRESSION: No CT findings to explain blood loss or anemia. Bilateral renal cysts. At least one renal lesion is indeterminate in the right kidney. This hyperdense structure could represent a hemorrhagic or proteinaceous cyst but indeterminate. Bilateral renal atrophy without hydronephrosis. Findings compatible with end-stage renal disease. Postsurgical changes compatible aortobifemoral bypass procedure. Mild emphysematous changes in lungs with a few indeterminate small pulmonary nodules. Largest measuring 4 mm. Consider noncontrast chest CT in 12 months due to underlying emphysematous disease. This recommendation follows the consensus statement: Guidelines for Management of Incidental Pulmonary Nodules Detected on CT Images:From the Fleischner Society 2017; published online before print (10.1148/[id number]). Age-indeterminate compression fractures at C7 and L5. Electronically Signed   By: Markus Daft M.D.   On: 06/05/2016 13:37   Nm Pulmonary Perf And Vent  Result Date: 06/06/2016 CLINICAL DATA:  Shortness  breath and chest pressure. EXAM: NUCLEAR MEDICINE VENTILATION - PERFUSION LUNG SCAN TECHNIQUE: Ventilation images  were obtained in multiple projections using inhaled aerosol Tc-37mDTPA. Perfusion images were obtained in multiple projections after intravenous injection of Tc-958mAA. RADIOPHARMACEUTICALS:  31.3 mCi Technetium-9961mPA aerosol inhalation and 4.1 mCi Technetium-84m48m IV COMPARISON:  CT of the chest 06/05/2016 FINDINGS: Ventilation: No focal ventilation defect. Perfusion: No wedge shaped peripheral perfusion defects to suggest acute pulmonary embolism. IMPRESSION: Negative ventilation perfusion study. Electronically Signed   By: ChriSan Morelle.   On: 06/06/2016 08:24   Dg Shoulder Left  Result Date: 06/05/2016 CLINICAL DATA:  Left shoulder joint pain and discomfort for 2-3 months. No known injury. EXAM: LEFT SHOULDER - 2+ VIEW COMPARISON:  Chest radiograph 06/04/2016 FINDINGS: No acute fracture or dislocation. Left upper lung zone appears clear. No abnormal soft tissue calcifications. IMPRESSION: No acute osseous abnormality Electronically Signed   By: Kim Donavan Foil.   On: 06/05/2016 13:35     PERTINENT LAB RESULTS: CBC:  Recent Labs  06/05/16 1157 06/06/16 0420  WBC 7.4 6.5  HGB 9.7* 9.4*  HCT 30.6* 28.8*  PLT 141* 141*   CMET CMP     Component Value Date/Time   NA 139 06/06/2016 0420   K 3.3 (L) 06/06/2016 0420   CL 100 (L) 06/06/2016 0420   CO2 30 06/06/2016 0420   GLUCOSE 112 (H) 06/06/2016 0420   BUN 16 06/06/2016 0420   CREATININE 4.90 (H) 06/06/2016 0420   CREATININE 5.03 (H) 01/10/2016 1251   CALCIUM 8.2 (L) 06/06/2016 0420   PROT 5.2 (L) 06/06/2016 0420   ALBUMIN 2.7 (L) 06/06/2016 0420   AST 26 06/06/2016 0420   ALT 14 (L) 06/06/2016 0420   ALKPHOS 93 06/06/2016 0420   BILITOT 0.5 06/06/2016 0420   GFRNONAA 11 (L) 06/06/2016 0420   GFRNONAA 10 (L) 01/10/2016 1251   GFRAA 12 (L) 06/06/2016 0420   GFRAA 12 (L) 01/10/2016 1251     GFR Estimated Creatinine Clearance: 15.3 mL/min (by C-G formula based on SCr of 4.9 mg/dL (H)). No results for input(s): LIPASE, AMYLASE in the last 72 hours.  Recent Labs  06/05/16 0039 06/05/16 0705 06/05/16 1056 06/05/16 1157  CKTOTAL  --   --   --  61  TROPONINI <0.03 <0.03 <0.03  --    Invalid input(s): POCBNP No results for input(s): DDIMER in the last 72 hours. No results for input(s): HGBA1C in the last 72 hours. No results for input(s): CHOL, HDL, LDLCALC, TRIG, CHOLHDL, LDLDIRECT in the last 72 hours. No results for input(s): TSH, T4TOTAL, T3FREE, THYROIDAB in the last 72 hours.  Invalid input(s): FREET3  Recent Labs  06/05/16 0705 06/05/16 1056  VITAMINB12 1,116*  --   TIBC  --  267  IRON  --  97   Coags:  Recent Labs  06/05/16 1056  INR 1.01   Microbiology: Recent Results (from the past 240 hour(s))  MRSA PCR Screening     Status: None   Collection Time: 06/05/16 12:12 AM  Result Value Ref Range Status   MRSA by PCR NEGATIVE NEGATIVE Final    Comment:        The GeneXpert MRSA Assay (FDA approved for NASAL specimens only), is one component of a comprehensive MRSA colonization surveillance program. It is not intended to diagnose MRSA infection nor to guide or monitor treatment for MRSA infections.     FURTHER DISCHARGE INSTRUCTIONS:  Get Medicines reviewed and adjusted: Please take all your medications with you for your next visit with your Primary MD  Laboratory/radiological data:  Please request your Primary MD to go over all hospital tests and procedure/radiological results at the follow up, please ask your Primary MD to get all Hospital records sent to his/her office.  In some cases, they will be blood work, cultures and biopsy results pending at the time of your discharge. Please request that your primary care M.D. goes through all the records of your hospital data and follows up on these results.  Also Note the following: If you  experience worsening of your admission symptoms, develop shortness of breath, life threatening emergency, suicidal or homicidal thoughts you must seek medical attention immediately by calling 911 or calling your MD immediately  if symptoms less severe.  You must read complete instructions/literature along with all the possible adverse reactions/side effects for all the Medicines you take and that have been prescribed to you. Take any new Medicines after you have completely understood and accpet all the possible adverse reactions/side effects.   Do not drive when taking Pain medications or sleeping medications (Benzodaizepines)  Do not take more than prescribed Pain, Sleep and Anxiety Medications. It is not advisable to combine anxiety,sleep and pain medications without talking with your primary care practitioner  Special Instructions: If you have smoked or chewed Tobacco  in the last 2 yrs please stop smoking, stop any regular Alcohol  and or any Recreational drug use.  Wear Seat belts while driving.  Please note: You were cared for by a hospitalist during your hospital stay. Once you are discharged, your primary care physician will handle any further medical issues. Please note that NO REFILLS for any discharge medications will be authorized once you are discharged, as it is imperative that you return to your primary care physician (or establish a relationship with a primary care physician if you do not have one) for your post hospital discharge needs so that they can reassess your need for medications and monitor your lab values.  Total Time spent coordinating discharge including counseling, education and face to face time equals 45 minutes.  SignedOren Binet 06/06/2016 5:37 PM

## 2016-06-06 NOTE — Progress Notes (Signed)
Echocardiogram 2D Echocardiogram has been performed.  [name] 06/06/2016, 10:37 AM

## 2016-06-06 NOTE — Progress Notes (Signed)
Dialysis treatment completed.  1500 mL ultrafiltrated and net fluid removal 1000 mL.    Patient status unchanged. Lung sounds clear to ausculation in all fields. Generalized edema. Cardiac: SB.  Disconnected lines and removed needles.  Pressure held for 10 minutes and band aid/gauze dressing applied.  Report given to bedside RN, Janett Billow.

## 2016-06-06 NOTE — Progress Notes (Signed)
[facility] Progress Note   Subjective: "I'm on my way to see you!" "I'm having a little shortness of breath today but I had a bad night last night! My L arm hurt and my BS went down".  Getting up and down to weigh prior to HD without difficulty. Talkative, NAD. Says they cant find anything but he does not know when he might go home  Objective Vitals:   06/06/16 0312 06/06/16 0356 06/06/16 0456 06/06/16 0538  BP: (!) 103/53 101/71 (!) 97/48 (!) 97/48  Pulse:    62  Resp:      Temp:    98 F (36.7 C)  TempSrc:    Oral  SpO2:      Weight:    92.3 kg (203 lb 8 oz)  Height:       Physical Exam General: Well nourished, NAD Heart: S1,S2, RRR Lungs: BBS CTA A/P Abdomen: Soft, non-tender Extremities: No LE edema Dialysis Access: RUA AVF + bruit. LUA AVF still has thrill/bruit-nonusable   Dialysis Orders: [facility] MWF 4 hours 400/800 90kg 2.0 K/2.0 Ca Heparin 5000 units IV q tx Venofer 50 mg IV q week (last dose 05/23/16 Last Fe 103 Tsat 37% 05/16/16) Calcitriol 1.75 mcg PO q treatment (last PTH 757 05/16/16) Renvela 800 mg 3 tabs TID AC Sensipar 30 mg PO Q hs  Additional Objective Labs: Basic Metabolic Panel:  Recent Labs Lab 06/04/16 1248 06/05/16 0705 06/06/16 0420  NA 140 142 139  K 4.4 4.2 3.3*  CL 97* 100* 100*  CO2 26 27 30   GLUCOSE 215* 85 112*  BUN 42* 51* 16  CREATININE 8.64* 9.83* 4.90*  CALCIUM 8.4* 8.1* 8.2*   Liver Function Tests:  Recent Labs Lab 06/05/16 1056 06/06/16 0420  AST 26 26  ALT 15* 14*  ALKPHOS 102 93  BILITOT 0.6 0.5  PROT 5.9* 5.2*  ALBUMIN 3.1* 2.7*   No results for input(s): LIPASE, AMYLASE in the last 168 hours. CBC:  Recent Labs Lab 06/04/16 1248 06/05/16 0705 06/05/16 1157 06/06/16 0420  WBC 8.8 8.0 7.4 6.5  HGB 10.4* 7.7* 9.7* 9.4*  HCT 32.3* 23.5* 30.6* 28.8*  MCV 108.4* 105.9* 106.6* 106.7*  PLT 137* PLATELET CLUMPS NOTED ON SMEAR, UNABLE TO ESTIMATE 141* 141*   Blood  Culture No results found for: SDES, SPECREQUEST, CULT, REPTSTATUS  Cardiac Enzymes:  Recent Labs Lab 06/04/16 1832 06/05/16 0039 06/05/16 0705 06/05/16 1056 06/05/16 1157  CKTOTAL  --   --   --   --  27  TROPONINI <0.03 <0.03 <0.03 <0.03  --    CBG:  Recent Labs Lab 06/05/16 1117 06/05/16 2121 06/06/16 0325 06/06/16 0408 06/06/16 1110  GLUCAP 123* 209* 62* 83 172*   Iron Studies:  Recent Labs  06/05/16 1056  IRON 97  TIBC 267   @[email or web address] @ Studies/Results: Ct Abdomen Pelvis Wo Contrast  Result Date: 06/05/2016 CLINICAL DATA:  75 year old with history of aortic surgery and acute anemia. History of end-stage renal disease. EXAM: CT CHEST, ABDOMEN AND PELVIS WITHOUT CONTRAST TECHNIQUE: Multidetector CT imaging of the chest, abdomen and pelvis was performed following the standard protocol without IV contrast. COMPARISON:  Abdominal CT 01/27/2007 FINDINGS: CT CHEST FINDINGS Cardiovascular: Diffuse atherosclerotic calcifications involving the thoracic aorta. The mid ascending thoracic aorta measures up to 3.7 cm. Origin of the great vessels are heavily calcified. Diffuse coronary artery calcifications. Descending thoracic aorta measures up to 3.0 cm. Mediastinum/Nodes: Calcifications in the mediastinum and right hilum compatible with old  granulomatous disease. No suspicious lymphadenopathy. Largest mediastinal lymph node is in the pretracheal region measuring 1.0 cm in short axis. Lungs/Pleura: Trachea and mainstem bronchi are patent. Mild emphysematous disease in the upper lungs. Few pleural-based densities at the right lung apex probably represent scarring. Irregular nodular density in the lingula roughly measures 4 mm on sequence 6, image 93. Punctate nodule in the left lower lobe on sequence 3, image 125. No large areas of lung consolidation. No significant pleural fluid. Subtle peripheral densities in the right lower lobe. Musculoskeletal: Compression deformity involving the C7  vertebral body is age-indeterminate. CT ABDOMEN PELVIS FINDINGS Hepatobiliary: 0.8 cm low-density structure in the right hepatic lobe on sequence 7, image 135. Additional small low-density structures near the hepatic dome. These low-density structures are indeterminate but could represent small cysts. Normal appearance of the gallbladder. No biliary dilatation. Pancreas: Normal appearance of the pancreas without inflammation or duct dilatation. Spleen: Normal appearance of spleen without enlargement. Adrenals/Urinary Tract: Normal appearance of the adrenal glands. Numerous bilateral exophytic renal lesions. Most of these lesions are low-density and suggestive for renal cysts. One structures is hyperdense in the right kidney interpolar region measuring up to 1.1 cm and probably new from the previous examination. These renal cysts have progressed from the previous examination. In addition, there has been progressive bilateral renal atrophy, left side greater than right. No hydronephrosis. Small calcification in left kidney could be vascular in etiology. Normal appearance of the urinary bladder. Stomach/Bowel: Diverticulosis in the sigmoid colon. No evidence for bowel obstruction. Diverticulosis in the right colon. Difficult to exclude asymmetric small bowel wall thickening on sequence 7, image 196. These findings are nonspecific and could be related to incomplete distension. Vascular/Lymphatic: Multiple surgical clips in the retroperitoneum near the abdominal aorta. Findings compatible with aortobifemoral bypass procedure. Common femoral arteries are heavily calcified. Visceral artery calcifications. No suspicious lymphadenopathy. Reproductive: No gross abnormality to the prostate or seminal vesicles. Other: No free fluid. No free air. Postsurgical changes along the anterior abdominal wall from previous aortic surgery. Left inguinal hernia containing fat. Musculoskeletal: Compression deformity involving the L5  vertebral body with sclerosis along the superior endplate. This is new since 2008 but probably represents a chronic deformity. There is extensive facet arthropathy at L4-L5. Joint space narrowing and degenerative changes in the right hip joint. IMPRESSION: No CT findings to explain blood loss or anemia. Bilateral renal cysts. At least one renal lesion is indeterminate in the right kidney. This hyperdense structure could represent a hemorrhagic or proteinaceous cyst but indeterminate. Bilateral renal atrophy without hydronephrosis. Findings compatible with end-stage renal disease. Postsurgical changes compatible aortobifemoral bypass procedure. Mild emphysematous changes in lungs with a few indeterminate small pulmonary nodules. Largest measuring 4 mm. Consider noncontrast chest CT in 12 months due to underlying emphysematous disease. This recommendation follows the consensus statement: Guidelines for Management of Incidental Pulmonary Nodules Detected on CT Images:From the Fleischner Society 2017; published online before print (10.1148/[id number]). Age-indeterminate compression fractures at C7 and L5. Electronically Signed   By: Markus Daft M.D.   On: 06/05/2016 13:37   Dg Chest 2 View  Result Date: 06/04/2016 CLINICAL DATA:  75 year old male with shortness of breath, central chest pressure for 1 week. Congestion. Initial encounter. Former smoker EXAM: CHEST  2 VIEW COMPARISON:  02/18/2016 and earlier. FINDINGS: Calcified aortic atherosclerosis. Normal cardiac size and mediastinal contours. Visualized tracheal air column is within normal limits. Stable lung volumes, at the upper limits of normal. Mild chronic increased interstitial markings in both lungs  appear stable. No pneumothorax, pulmonary edema, pleural effusion or acute pulmonary opacity. Stable epigastric surgical clips. Osteopenia. No acute osseous abnormality identified. IMPRESSION: 1. No acute cardiopulmonary abnormality. Chronic interstitial  pulmonary changes suspected. 2.  Calcified aortic atherosclerosis. Electronically Signed   By: Genevie Ann M.D.   On: 06/04/2016 13:31   Ct Chest Wo Contrast  Result Date: 06/05/2016 CLINICAL DATA:  75 year old with history of aortic surgery and acute anemia. History of end-stage renal disease. EXAM: CT CHEST, ABDOMEN AND PELVIS WITHOUT CONTRAST TECHNIQUE: Multidetector CT imaging of the chest, abdomen and pelvis was performed following the standard protocol without IV contrast. COMPARISON:  Abdominal CT 01/27/2007 FINDINGS: CT CHEST FINDINGS Cardiovascular: Diffuse atherosclerotic calcifications involving the thoracic aorta. The mid ascending thoracic aorta measures up to 3.7 cm. Origin of the great vessels are heavily calcified. Diffuse coronary artery calcifications. Descending thoracic aorta measures up to 3.0 cm. Mediastinum/Nodes: Calcifications in the mediastinum and right hilum compatible with old granulomatous disease. No suspicious lymphadenopathy. Largest mediastinal lymph node is in the pretracheal region measuring 1.0 cm in short axis. Lungs/Pleura: Trachea and mainstem bronchi are patent. Mild emphysematous disease in the upper lungs. Few pleural-based densities at the right lung apex probably represent scarring. Irregular nodular density in the lingula roughly measures 4 mm on sequence 6, image 93. Punctate nodule in the left lower lobe on sequence 3, image 125. No large areas of lung consolidation. No significant pleural fluid. Subtle peripheral densities in the right lower lobe. Musculoskeletal: Compression deformity involving the C7 vertebral body is age-indeterminate. CT ABDOMEN PELVIS FINDINGS Hepatobiliary: 0.8 cm low-density structure in the right hepatic lobe on sequence 7, image 135. Additional small low-density structures near the hepatic dome. These low-density structures are indeterminate but could represent small cysts. Normal appearance of the gallbladder. No biliary dilatation.  Pancreas: Normal appearance of the pancreas without inflammation or duct dilatation. Spleen: Normal appearance of spleen without enlargement. Adrenals/Urinary Tract: Normal appearance of the adrenal glands. Numerous bilateral exophytic renal lesions. Most of these lesions are low-density and suggestive for renal cysts. One structures is hyperdense in the right kidney interpolar region measuring up to 1.1 cm and probably new from the previous examination. These renal cysts have progressed from the previous examination. In addition, there has been progressive bilateral renal atrophy, left side greater than right. No hydronephrosis. Small calcification in left kidney could be vascular in etiology. Normal appearance of the urinary bladder. Stomach/Bowel: Diverticulosis in the sigmoid colon. No evidence for bowel obstruction. Diverticulosis in the right colon. Difficult to exclude asymmetric small bowel wall thickening on sequence 7, image 196. These findings are nonspecific and could be related to incomplete distension. Vascular/Lymphatic: Multiple surgical clips in the retroperitoneum near the abdominal aorta. Findings compatible with aortobifemoral bypass procedure. Common femoral arteries are heavily calcified. Visceral artery calcifications. No suspicious lymphadenopathy. Reproductive: No gross abnormality to the prostate or seminal vesicles. Other: No free fluid. No free air. Postsurgical changes along the anterior abdominal wall from previous aortic surgery. Left inguinal hernia containing fat. Musculoskeletal: Compression deformity involving the L5 vertebral body with sclerosis along the superior endplate. This is new since 2008 but probably represents a chronic deformity. There is extensive facet arthropathy at L4-L5. Joint space narrowing and degenerative changes in the right hip joint. IMPRESSION: No CT findings to explain blood loss or anemia. Bilateral renal cysts. At least one renal lesion is indeterminate  in the right kidney. This hyperdense structure could represent a hemorrhagic or proteinaceous cyst but indeterminate. Bilateral renal  atrophy without hydronephrosis. Findings compatible with end-stage renal disease. Postsurgical changes compatible aortobifemoral bypass procedure. Mild emphysematous changes in lungs with a few indeterminate small pulmonary nodules. Largest measuring 4 mm. Consider noncontrast chest CT in 12 months due to underlying emphysematous disease. This recommendation follows the consensus statement: Guidelines for Management of Incidental Pulmonary Nodules Detected on CT Images:From the Fleischner Society 2017; published online before print (10.1148/[id number]). Age-indeterminate compression fractures at C7 and L5. Electronically Signed   By: Markus Daft M.D.   On: 06/05/2016 13:37   Nm Pulmonary Perf And Vent  Result Date: 06/06/2016 CLINICAL DATA:  Shortness breath and chest pressure. EXAM: NUCLEAR MEDICINE VENTILATION - PERFUSION LUNG SCAN TECHNIQUE: Ventilation images were obtained in multiple projections using inhaled aerosol Tc-63m DTPA. Perfusion images were obtained in multiple projections after intravenous injection of Tc-21m MAA. RADIOPHARMACEUTICALS:  31.3 mCi Technetium-90m DTPA aerosol inhalation and 4.1 mCi Technetium-61m MAA IV COMPARISON:  CT of the chest 06/05/2016 FINDINGS: Ventilation: No focal ventilation defect. Perfusion: No wedge shaped peripheral perfusion defects to suggest acute pulmonary embolism. IMPRESSION: Negative ventilation perfusion study. Electronically Signed   By: San Morelle M.D.   On: 06/06/2016 08:24   Dg Shoulder Left  Result Date: 06/05/2016 CLINICAL DATA:  Left shoulder joint pain and discomfort for 2-3 months. No known injury. EXAM: LEFT SHOULDER - 2+ VIEW COMPARISON:  Chest radiograph 06/04/2016 FINDINGS: No acute fracture or dislocation. Left upper lung zone appears clear. No abnormal soft tissue calcifications. IMPRESSION:  No acute osseous abnormality Electronically Signed   By: Donavan Foil M.D.   On: 06/05/2016 13:35   Medications:   . allopurinol  100 mg Oral Daily  . cinacalcet  30 mg Oral Q breakfast  . donepezil  10 mg Oral QHS  . heparin subcutaneous  5,000 Units Subcutaneous Q8H  . insulin aspart  0-15 Units Subcutaneous TID WC  . insulin aspart  0-5 Units Subcutaneous QHS  . insulin glargine  8 Units Subcutaneous Daily  . levothyroxine  50 mcg Oral QAC breakfast  . metroNIDAZOLE   Topical BID  . sevelamer carbonate  1,600 mg Oral BID WC   And  . sevelamer carbonate  2,400 mg Oral QAC supper  . simvastatin  20 mg Oral QHS     Assessment/Plan: 1. Progressive DOE: per primary-appear comfortable on RA. No LE edema, No evidence of     volume overload. CT chest/pelvis showed Mild emphysematous changes in lungs with a few indeterminate small pulmonary nodules. Recommend F/U 12 months. Had VQ scan this AM. No perfusion defect seen. Low probability of PE.  2.  LUE pain/paresthesia: Seen per [facility]. Per arterial duplex, arteries in L hand patent. Patient to F/U with Dr. Oneida Alar as OP.  RLE duplex negative. I dont have any more ideas 3.  ESRD - MWF Short HD today to get back on schedule. K+3.3. 4.0 K bath.  4.  Hypertension/volume  -Starting HD. Pre Wt 92.9 KG. BP soft, coreg has been Dc'd. UFG 1000 may need to raise OP EDW slightly. Keep SBP >100. 5.   Anemia  - HGB 9.4 today. Rec'd ESA dose yesterday. Follow HGB.  6.   Metabolic bone disease -  Continue binders, VDRA, cinacalcet.  7.   Nutrition - Albumin 2.7. Ask dietician at OP HD unit to see when Dc'd home.  renal/carb mod diet. Renal vit/nrepro 8.   Hypothyroidism: per primary 9.   DM: per primary  Rita H. Brown NP-C 06/06/2016, 11:39 AM  [facility]  [phone]  Patient seen and examined, agree with above note with above modifications. Seen on HD- no new complaints.  Work up per pt is negative for DOE and hand issue.  HD today to  get back on schedule- hopefully will go home soon  Corliss Parish, MD 06/06/2016

## 2016-06-08 ENCOUNTER — Ambulatory Visit: Payer: Medicare Other | Admitting: Endocrinology

## 2016-06-08 DIAGNOSIS — N2581 Secondary hyperparathyroidism of renal origin: Secondary | ICD-10-CM | POA: Diagnosis not present

## 2016-06-08 DIAGNOSIS — E1129 Type 2 diabetes mellitus with other diabetic kidney complication: Secondary | ICD-10-CM | POA: Diagnosis not present

## 2016-06-08 DIAGNOSIS — D631 Anemia in chronic kidney disease: Secondary | ICD-10-CM | POA: Diagnosis not present

## 2016-06-08 DIAGNOSIS — Z23 Encounter for immunization: Secondary | ICD-10-CM | POA: Diagnosis not present

## 2016-06-08 DIAGNOSIS — N186 End stage renal disease: Secondary | ICD-10-CM | POA: Diagnosis not present

## 2016-06-11 ENCOUNTER — Telehealth: Payer: Self-pay | Admitting: Vascular Surgery

## 2016-06-11 DIAGNOSIS — N2581 Secondary hyperparathyroidism of renal origin: Secondary | ICD-10-CM | POA: Diagnosis not present

## 2016-06-11 DIAGNOSIS — Z23 Encounter for immunization: Secondary | ICD-10-CM | POA: Diagnosis not present

## 2016-06-11 DIAGNOSIS — E1129 Type 2 diabetes mellitus with other diabetic kidney complication: Secondary | ICD-10-CM | POA: Diagnosis not present

## 2016-06-11 DIAGNOSIS — N186 End stage renal disease: Secondary | ICD-10-CM | POA: Diagnosis not present

## 2016-06-11 DIAGNOSIS — D631 Anemia in chronic kidney disease: Secondary | ICD-10-CM | POA: Diagnosis not present

## 2016-06-11 NOTE — Telephone Encounter (Signed)
Spoke to pt on home # for ABI appt 10/3 @ 3:45, and F/U w/Dr Oneida Alar 10/5 @ 9:45

## 2016-06-11 NOTE — Telephone Encounter (Signed)
Mena Goes, RN  P [facility] Admin Pool      Previous Messages    ----- Message -----  From: Elam Dutch, MD  Sent: 06/05/2016 11:15 AM  To: [facility] Charge Pool   Daily visit charge to check hand    He needs a follow up visit with me in 2-3 weeks. If he does not get ABIs in the hospital he will need them at that office visit    Ruta Hinds

## 2016-06-11 NOTE — Telephone Encounter (Signed)
-----   Message from Mena Goes, RN sent at 06/05/2016 10:59 AM EDT ----- Regarding: schedule 3 week    ----- Message ----- From: Alvia Grove, PA-C Sent: 06/05/2016  10:07 AM To: [facility] consult left hand numbness and pain and leg weakness 06/04/16  Needs outpatient follow-up with Dr. Oneida Alar in 3 weeks. No studies.   Thanks Maudie Mercury

## 2016-06-12 DIAGNOSIS — L218 Other seborrheic dermatitis: Secondary | ICD-10-CM | POA: Diagnosis not present

## 2016-06-13 DIAGNOSIS — N2581 Secondary hyperparathyroidism of renal origin: Secondary | ICD-10-CM | POA: Diagnosis not present

## 2016-06-13 DIAGNOSIS — Z23 Encounter for immunization: Secondary | ICD-10-CM | POA: Diagnosis not present

## 2016-06-13 DIAGNOSIS — D631 Anemia in chronic kidney disease: Secondary | ICD-10-CM | POA: Diagnosis not present

## 2016-06-13 DIAGNOSIS — E1129 Type 2 diabetes mellitus with other diabetic kidney complication: Secondary | ICD-10-CM | POA: Diagnosis not present

## 2016-06-13 DIAGNOSIS — N186 End stage renal disease: Secondary | ICD-10-CM | POA: Diagnosis not present

## 2016-06-14 ENCOUNTER — Encounter: Payer: Self-pay | Admitting: Internal Medicine

## 2016-06-14 ENCOUNTER — Ambulatory Visit (INDEPENDENT_AMBULATORY_CARE_PROVIDER_SITE_OTHER): Payer: Medicare Other | Admitting: Internal Medicine

## 2016-06-14 VITALS — BP 126/84 | HR 75 | Temp 99.0°F | Wt 200.5 lb

## 2016-06-14 DIAGNOSIS — N186 End stage renal disease: Secondary | ICD-10-CM | POA: Diagnosis not present

## 2016-06-14 DIAGNOSIS — E785 Hyperlipidemia, unspecified: Secondary | ICD-10-CM | POA: Diagnosis not present

## 2016-06-14 DIAGNOSIS — N185 Chronic kidney disease, stage 5: Secondary | ICD-10-CM

## 2016-06-14 DIAGNOSIS — R5383 Other fatigue: Secondary | ICD-10-CM

## 2016-06-14 DIAGNOSIS — Z8679 Personal history of other diseases of the circulatory system: Secondary | ICD-10-CM

## 2016-06-14 DIAGNOSIS — Z992 Dependence on renal dialysis: Secondary | ICD-10-CM

## 2016-06-14 DIAGNOSIS — E089 Diabetes mellitus due to underlying condition without complications: Secondary | ICD-10-CM | POA: Diagnosis not present

## 2016-06-14 DIAGNOSIS — M791 Myalgia, unspecified site: Secondary | ICD-10-CM

## 2016-06-14 DIAGNOSIS — R413 Other amnesia: Secondary | ICD-10-CM | POA: Diagnosis not present

## 2016-06-14 DIAGNOSIS — E039 Hypothyroidism, unspecified: Secondary | ICD-10-CM | POA: Diagnosis not present

## 2016-06-14 LAB — COMPREHENSIVE METABOLIC PANEL
ALT: 12 U/L (ref 9–46)
AST: 23 U/L (ref 10–35)
Albumin: 3.8 g/dL (ref 3.6–5.1)
Alkaline Phosphatase: 126 U/L — ABNORMAL HIGH (ref 40–115)
BUN: 26 mg/dL — ABNORMAL HIGH (ref 7–25)
CO2: 30 mmol/L (ref 20–31)
Calcium: 9.2 mg/dL (ref 8.6–10.3)
Chloride: 95 mmol/L — ABNORMAL LOW (ref 98–110)
Creat: 5.58 mg/dL — ABNORMAL HIGH (ref 0.70–1.18)
Glucose, Bld: 248 mg/dL — ABNORMAL HIGH (ref 65–99)
Potassium: 3.8 mmol/L (ref 3.5–5.3)
Sodium: 139 mmol/L (ref 135–146)
Total Bilirubin: 0.4 mg/dL (ref 0.2–1.2)
Total Protein: 6.1 g/dL (ref 6.1–8.1)

## 2016-06-14 LAB — CBC WITH DIFFERENTIAL/PLATELET
Basophils Absolute: 0 cells/uL (ref 0–200)
Basophils Relative: 0 %
Eosinophils Absolute: 345 cells/uL (ref 15–500)
Eosinophils Relative: 5 %
HCT: 31 % — ABNORMAL LOW (ref 38.5–50.0)
Hemoglobin: 10.2 g/dL — ABNORMAL LOW (ref 13.2–17.1)
Lymphocytes Relative: 22 %
Lymphs Abs: 1518 cells/uL (ref 850–3900)
MCH: 34.8 pg — ABNORMAL HIGH (ref 27.0–33.0)
MCHC: 32.9 g/dL (ref 32.0–36.0)
MCV: 105.8 fL — ABNORMAL HIGH (ref 80.0–100.0)
MPV: 10.1 fL (ref 7.5–12.5)
Monocytes Absolute: 552 cells/uL (ref 200–950)
Monocytes Relative: 8 %
Neutro Abs: 4485 cells/uL (ref 1500–7800)
Neutrophils Relative %: 65 %
Platelets: 177 10*3/uL (ref 140–400)
RBC: 2.93 MIL/uL — ABNORMAL LOW (ref 4.20–5.80)
RDW: 16.8 % — ABNORMAL HIGH (ref 11.0–15.0)
WBC: 6.9 10*3/uL (ref 3.8–10.8)

## 2016-06-14 LAB — TSH: TSH: 1.43 mIU/L (ref 0.40–4.50)

## 2016-06-14 LAB — CK: Total CK: 54 U/L (ref 7–232)

## 2016-06-14 NOTE — Progress Notes (Signed)
   Subjective:    Patient ID: [name], male    DOB: 1940-10-04, 75 y.o.   MRN: [id number]  HPI He was discharged from hospital September 13 after admission for numbness and cold feeling in left hand. Current vascular access is in right arm. Pt had driven 4 hours [place] from the beach the  [place] before admission. Had been unable to get dialysis appt in [facility] on Monday which would be a usual dialysis day for him.  Says BOTH hands felt numb in 2nd 3rd and 4th fingers. He initially thought he might be having a stroke then was worried about an aortic aneurysm as he had some back pain.  CT of chest showed small pulmonary nodules apprx 29m in size with repeat CT suggested in 12 months but pt does not want to wait that long. Also had COPD on CT.  He actually looks the best I have seen him in some time. Is alert and oriented to person place and time.    Review of Systems complaining of myalgias     Objective:   Physical Exam  Constitutional: He is oriented to person, place, and time. He appears well-developed and well-nourished.  Neck: Neck supple. No JVD present. No thyromegaly present.  Cardiovascular: Normal rate, regular rhythm and normal heart sounds.   Pulmonary/Chest: Effort normal and breath sounds normal. No respiratory distress. He has no wheezes.  Musculoskeletal: He exhibits no edema.  Lymphadenopathy:    He has no cervical adenopathy.  Neurological: He is oriented to person, place, and time.  Skin: Skin is warm and dry.  Psychiatric: He has a normal mood and affect. His behavior is normal. Judgment and thought content normal.  Vitals reviewed.         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 He does not want to wait 12  months.  Memory loss-stable and actually seems a bit improved today continue Aricept  Hyperlipidemia-stable on statin therapy  Hypothyroidism-on thyroid replacement  Insulin-dependent diabetes mellitus-followed by Dr. KDwyane Dee History of gout treated with allopurinol  History of sleep apnea  Essential hypertension  Plan: CBC with differential, C met, sedimentation rate and hemoglobin A1c drawn. See me in December for physical examination. Has neurology follow-up soon.  Checked his sedimentation rate which was minimally elevated at 25. Total CK was normal. Hemoglobin A1c was 7.4% and had been 7.2% 5 months ago. CBC showed anemia of chronic disease with hemoglobin 10.2 g. MCV 105.8. B-12 level checked in hospital was 1116.

## 2016-06-15 DIAGNOSIS — E1129 Type 2 diabetes mellitus with other diabetic kidney complication: Secondary | ICD-10-CM | POA: Diagnosis not present

## 2016-06-15 DIAGNOSIS — Z23 Encounter for immunization: Secondary | ICD-10-CM | POA: Diagnosis not present

## 2016-06-15 DIAGNOSIS — N2581 Secondary hyperparathyroidism of renal origin: Secondary | ICD-10-CM | POA: Diagnosis not present

## 2016-06-15 DIAGNOSIS — D631 Anemia in chronic kidney disease: Secondary | ICD-10-CM | POA: Diagnosis not present

## 2016-06-15 DIAGNOSIS — N186 End stage renal disease: Secondary | ICD-10-CM | POA: Diagnosis not present

## 2016-06-15 LAB — HEMOGLOBIN A1C
Hgb A1c MFr Bld: 7.4 % — ABNORMAL HIGH (ref <5.7)
Mean Plasma Glucose: 166 mg/dL

## 2016-06-15 LAB — SEDIMENTATION RATE: Sed Rate: 25 mm/hr — ABNORMAL HIGH (ref 0–20)

## 2016-06-18 DIAGNOSIS — D631 Anemia in chronic kidney disease: Secondary | ICD-10-CM | POA: Diagnosis not present

## 2016-06-18 DIAGNOSIS — N186 End stage renal disease: Secondary | ICD-10-CM | POA: Diagnosis not present

## 2016-06-18 DIAGNOSIS — N2581 Secondary hyperparathyroidism of renal origin: Secondary | ICD-10-CM | POA: Diagnosis not present

## 2016-06-18 DIAGNOSIS — E1129 Type 2 diabetes mellitus with other diabetic kidney complication: Secondary | ICD-10-CM | POA: Diagnosis not present

## 2016-06-18 DIAGNOSIS — Z23 Encounter for immunization: Secondary | ICD-10-CM | POA: Diagnosis not present

## 2016-06-20 DIAGNOSIS — N2581 Secondary hyperparathyroidism of renal origin: Secondary | ICD-10-CM | POA: Diagnosis not present

## 2016-06-20 DIAGNOSIS — E1129 Type 2 diabetes mellitus with other diabetic kidney complication: Secondary | ICD-10-CM | POA: Diagnosis not present

## 2016-06-20 DIAGNOSIS — D631 Anemia in chronic kidney disease: Secondary | ICD-10-CM | POA: Diagnosis not present

## 2016-06-20 DIAGNOSIS — N186 End stage renal disease: Secondary | ICD-10-CM | POA: Diagnosis not present

## 2016-06-20 DIAGNOSIS — Z23 Encounter for immunization: Secondary | ICD-10-CM | POA: Diagnosis not present

## 2016-06-21 ENCOUNTER — Other Ambulatory Visit: Payer: Medicare Other | Admitting: Internal Medicine

## 2016-06-22 DIAGNOSIS — N2581 Secondary hyperparathyroidism of renal origin: Secondary | ICD-10-CM | POA: Diagnosis not present

## 2016-06-22 DIAGNOSIS — N186 End stage renal disease: Secondary | ICD-10-CM | POA: Diagnosis not present

## 2016-06-22 DIAGNOSIS — E1129 Type 2 diabetes mellitus with other diabetic kidney complication: Secondary | ICD-10-CM | POA: Diagnosis not present

## 2016-06-22 DIAGNOSIS — Z23 Encounter for immunization: Secondary | ICD-10-CM | POA: Diagnosis not present

## 2016-06-22 DIAGNOSIS — D631 Anemia in chronic kidney disease: Secondary | ICD-10-CM | POA: Diagnosis not present

## 2016-06-23 ENCOUNTER — Encounter: Payer: Self-pay | Admitting: Internal Medicine

## 2016-06-23 DIAGNOSIS — N186 End stage renal disease: Secondary | ICD-10-CM | POA: Diagnosis not present

## 2016-06-23 DIAGNOSIS — Z992 Dependence on renal dialysis: Secondary | ICD-10-CM | POA: Diagnosis not present

## 2016-06-23 DIAGNOSIS — E1122 Type 2 diabetes mellitus with diabetic chronic kidney disease: Secondary | ICD-10-CM | POA: Diagnosis not present

## 2016-06-23 NOTE — Patient Instructions (Signed)
It was a pleasure to see you today. Continue same medications and return in December for physical exam. Checking total CK and sedimentation rate for complaint of myalgias.

## 2016-06-25 ENCOUNTER — Other Ambulatory Visit: Payer: Self-pay | Admitting: *Deleted

## 2016-06-25 DIAGNOSIS — R29898 Other symptoms and signs involving the musculoskeletal system: Secondary | ICD-10-CM

## 2016-06-25 DIAGNOSIS — N2581 Secondary hyperparathyroidism of renal origin: Secondary | ICD-10-CM | POA: Diagnosis not present

## 2016-06-25 DIAGNOSIS — D631 Anemia in chronic kidney disease: Secondary | ICD-10-CM | POA: Diagnosis not present

## 2016-06-25 DIAGNOSIS — N186 End stage renal disease: Secondary | ICD-10-CM | POA: Diagnosis not present

## 2016-06-26 ENCOUNTER — Encounter: Payer: Self-pay | Admitting: Vascular Surgery

## 2016-06-26 ENCOUNTER — Ambulatory Visit (HOSPITAL_COMMUNITY)
Admission: RE | Admit: 2016-06-26 | Discharge: 2016-06-26 | Disposition: A | Discharge status: Home / Self Care | Payer: Medicare Other | Source: Ambulatory Visit | Attending: Vascular Surgery | Admitting: Vascular Surgery

## 2016-06-26 ENCOUNTER — Encounter: Payer: Medicare Other | Admitting: Internal Medicine

## 2016-06-26 DIAGNOSIS — R29898 Other symptoms and signs involving the musculoskeletal system: Secondary | ICD-10-CM | POA: Diagnosis not present

## 2016-06-26 DIAGNOSIS — I739 Peripheral vascular disease, unspecified: Secondary | ICD-10-CM | POA: Diagnosis not present

## 2016-06-26 DIAGNOSIS — R9439 Abnormal result of other cardiovascular function study: Secondary | ICD-10-CM | POA: Diagnosis not present

## 2016-06-27 DIAGNOSIS — N2581 Secondary hyperparathyroidism of renal origin: Secondary | ICD-10-CM | POA: Diagnosis not present

## 2016-06-27 DIAGNOSIS — D631 Anemia in chronic kidney disease: Secondary | ICD-10-CM | POA: Diagnosis not present

## 2016-06-27 DIAGNOSIS — N186 End stage renal disease: Secondary | ICD-10-CM | POA: Diagnosis not present

## 2016-06-28 ENCOUNTER — Encounter: Payer: Self-pay | Admitting: Vascular Surgery

## 2016-06-28 ENCOUNTER — Ambulatory Visit (INDEPENDENT_AMBULATORY_CARE_PROVIDER_SITE_OTHER): Payer: Medicare Other | Admitting: Vascular Surgery

## 2016-06-28 VITALS — BP 147/65 | HR 64 | Temp 98.4°F | Resp 20 | Ht 71.0 in | Wt 202.0 lb

## 2016-06-28 DIAGNOSIS — I739 Peripheral vascular disease, unspecified: Secondary | ICD-10-CM

## 2016-06-28 NOTE — Progress Notes (Signed)
Patient name: [name] MRN: [id number] DOB: [birth year] Sex: male  HPI: [name] is a 75 y.o. male who was recently seen in the hospital as a consult with left hand pain and bilateral lower extremity weakness. The patient has previously had a left upper arm AV fistula and there was some concern for steal but he has a chronic left subclavian vein occlusions of this was thought to be less likely. He has recently seen his primary care physician and thought this may have been an exacerbation of possible carpal tunnel syndrome. He states his left hand is better. As far as his lower extremity weakness is concerned. He occasionally has some right hip and back pain. He states otherwise his walking is improved. He did have a previous aortobifemoral bypass by Dr. Amedeo Plenty many years ago. His pain symptoms he has no ulcerations on his feet. Other medical problems include coronary artery disease, diabetes, end-stage renal disease all of which are currently stable.  Past Medical History:  Diagnosis Date  . Allergy   . Anemia   . Arthritis   . Coronary artery disease   . Diabetes mellitus    Type 2  . Diverticulitis   . ED (erectile dysfunction)   . Elevated homocysteine ([place])   . ESRD (end stage renal disease) on dialysis ([place]) 03/2015  . GERD (gastroesophageal reflux disease)    pepto   . Gout   . Hyperlipidemia   . Hypertension   . Hypothyroidism   . Pneumonia   . PVD (peripheral vascular disease) ([place])    has plastic aorta  . Renal insufficiency   . Seasonal allergies   . Shortness of breath dyspnea   . Sleep apnea   . Thyroid disease    Past Surgical History:  Procedure Laterality Date  . aortobifemoral bypass    . AV FISTULA PLACEMENT Left 12/01/2013   Procedure: ARTERIOVENOUS (AV) FISTULA CREATION- LEFT BRACHIOCEPHALIC;  Surgeon: Angelia Mould, MD;  Location: [facility];  Service: Vascular;  Laterality: Left;  . [place] TRANSPOSITION Right 07/27/2014   Procedure:  BASCILIC VEIN TRANSPOSITION;  Surgeon: Angelia Mould, MD;  Location: [facility];  Service: Vascular;  Laterality: Right;  . BREAST SURGERY     left - granulomatous mastitis  . COLONOSCOPY    . 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 EYE SURGERY Bilateral    cataracts  . REVISON OF ARTERIOVENOUS FISTULA Left 02/09/2014   Procedure: REVISON OF LEFT ARTERIOVENOUS FISTULA - RESECTION OF RENDUNDANT VEIN;  Surgeon: Angelia Mould, MD;  Location: [facility];  Service: Vascular;  Laterality: Left;  . SBO with lysis adhesions    . SHUNTOGRAM Left 04/19/2014   Procedure: FISTULOGRAM;  Surgeon: Angelia Mould, MD;  Location: [facility] CATH LAB;  Service: Cardiovascular;  Laterality: Left;  . UNILATERAL UPPER EXTREMEITY ANGIOGRAM N/A 07/12/2014   Procedure: UNILATERAL UPPER Anselmo Rod;  Surgeon: Angelia Mould, MD;  Location: [facility] CATH LAB;  Service: Cardiovascular;  Laterality: N/A;    Family History  Problem Relation Age of Onset  . Aneurysm Mother   . Heart disease Father   . Stroke Father   . Hypertension Father   . Diabetes Father   . Dementia Neg Hx     SOCIAL HISTORY: Social History   Social History  . Marital status: Married    Spouse name: [name] Mood  . Number of children: 1  . Years of education: 21  Occupational History  . Retired    Social History Main Topics  . Smoking status: Former Smoker    Quit date: 09/24/1994  . Smokeless tobacco: Never Used  . Alcohol use No     Comment: Quit Oct. 1977 ("somewhat heavy")  . Drug use: No  . Sexual activity: Not on file   Other Topics Concern  . Not on file   Social History Narrative   Lives at home with wife.   Caffeine use: Drinks no soda or tea.    Drinks 1 cup coffee/week       Allergies  Allergen Reactions  . Penicillins Rash    Has patient had a PCN reaction causing immediate rash, facial/tongue/throat swelling, SOB or lightheadedness with hypotension: Yes Has patient had a PCN reaction  causing severe rash involving mucus membranes or skin necrosis: Yes Has patient had a PCN reaction that required hospitalization: No Has patient had a PCN reaction occurring within the last 10 years: No If all of the above answers are "NO", then may proceed with Cephalosporin use.     Current Outpatient Prescriptions  Medication Sig Dispense Refill  . acetaminophen (TYLENOL) 500 MG tablet Take 500 mg by mouth every 6 (six) hours as needed (for pain).     Marland Kitchen allopurinol (ZYLOPRIM) 100 MG tablet Take 100 mg by mouth daily.      Marland Kitchen aspirin 325 MG tablet Take 650 mg by mouth daily.     Marland Kitchen atorvastatin (LIPITOR) 40 MG tablet Take 1 tablet (40 mg total) by mouth daily. 30 tablet 0  . Blood Glucose Monitoring Suppl (FREESTYLE FREEDOM LITE) w/Device KIT Use to check blood sugar 2 times per day dx code E11.65 1 each 0  . clobetasol (TEMOVATE) 0.05 % external solution apply to affected area ON THE SCALP UP TO 2 TIMES A DAY AS NEEDED...  (REFER TO PRESCRIPTION NOTES).  0  . donepezil (ARICEPT) 10 MG tablet Take 1 tablet (10 mg total) by mouth at bedtime. 30 tablet 12  . FLUOCINOLONE ACETONIDE SCALP 0.01 % OIL apply to affected area ON SCALP EVERY NIGHT AT BEDTIME, WASH UPON AWAKENING  0  . fluticasone (CUTIVATE) 0.05 % cream apply to affected area ON FACE UP TO 2 TIMES A DAY AS NEEDED  0  . fluticasone (FLONASE) 50 MCG/ACT nasal spray Place 1 spray into both nostrils daily. (Patient taking differently: Place 1 spray into both nostrils daily as needed for allergies. ) 16 g 2  . glucose blood (ONETOUCH VERIO) test strip Use to check blood sugar 2 times per day. Dx code E11.65 100 each 2  . insulin aspart (NOVOLOG) 100 UNIT/ML injection Inject 10 Units into the skin 3 (three) times daily before meals. (Patient taking differently: Inject 5-8 Units into the skin See admin instructions. Two to three times a day before meals) 20 mL 2  . insulin glargine (LANTUS) 100 UNIT/ML injection Inject 0.08 mLs (8 Units total)  into the skin every morning.    Marland Kitchen ketoconazole (NIZORAL) 2 % shampoo Apply 1 application topically 2 (two) times a week. 120 mL 0  . Lancets (FREESTYLE) lancets Use as instructed to check blood sugar 2 times per day dx code E11.65 100 each 3  . levothyroxine (SYNTHROID, LEVOTHROID) 50 MCG tablet Take 1 tablet (50 mcg total) by mouth daily. 90 tablet 1  . lidocaine-prilocaine (EMLA) cream Apply 1 application topically once. AS DIRECTED  1  . loratadine (CLARITIN) 10 MG tablet Take 10 mg by mouth daily  as needed for allergies.     . metroNIDAZOLE (METROGEL) 0.75 % gel APPLY TO AFFECTED AREA ON SKIN  ON FACE TWICE DAILY  0  . mupirocin ointment (BACTROBAN) 2 % Use as directed to affected areas 22 g prn  . Naftifine HCl (NAFTIN) 2 % CREA Apply to feet daily for fungal infection 60 g prn  . RENVELA 800 MG tablet Take 800-2,400 mg by mouth See admin instructions. 1,600-2,400 mg with breakfast then 1,600 mg with lunch then 2,400 mg with dinner (evening meal) and 800 mg with each snack  0  . SENSIPAR 30 MG tablet Take 30 mg by mouth daily.   1  . simvastatin (ZOCOR) 20 MG tablet Take 20 mg by mouth at bedtime.     . triamcinolone ointment (KENALOG) 0.1 % Apply 1 application topically 2 (two) times daily. Use between thighs     No current facility-administered medications for this visit.     ROS:   General:  No weight loss, Fever, chills  HEENT: No recent headaches, no nasal bleeding, no visual changes, no sore throat  Neurologic: No dizziness, blackouts, seizures. No recent symptoms of stroke or mini- stroke. No recent episodes of slurred speech, or temporary blindness.  Cardiac: No recent episodes of chest pain/pressure, no shortness of breath at rest.  No shortness of breath with exertion.  Denies history of atrial fibrillation or irregular heartbeat  Vascular: No history of rest pain in feet.  No history of claudication.  No history of non-healing ulcer, No history of DVT   Pulmonary: No home  oxygen, no productive cough, no hemoptysis,  No asthma or wheezing  Musculoskeletal:  [X]  Arthritis, [X]  Low back pain,  [X]  Joint pain  Hematologic:No history of hypercoagulable state.  No history of easy bleeding.  No history of anemia  Gastrointestinal: No hematochezia or melena,  No gastroesophageal reflux, no trouble swallowing  Urinary: [X]  chronic Kidney disease, [X]  on HD - [ ]  MWF or [ ]  TTHS, [ ]  Burning with urination, [ ]  Frequent urination, [ ]  Difficulty urinating;   Skin: No rashes  Psychological: No history of anxiety,  No history of depression   Physical Examination  Vitals:   06/28/16 0949  BP: (!) 147/65  Pulse: 64  Resp: 20  Temp: 98.4 F (36.9 C)  TempSrc: Oral  SpO2: 97%  Weight: 202 lb (91.6 kg)  Height: 5' 11"  (1.803 m)    Body mass index is 28.17 kg/m.  General:  Alert and oriented, no acute distress HEENT: Normal Neck: No bruit or JVD Pulmonary: Clear to auscultation bilaterally Cardiac: Regular Rate and Rhythm without murmur Abdomen: Soft, non-tender, non-distended, obese Skin: No rash Extremity Pulses:  2+ radial, brachial, femoral, dorsalis pedis, posterior tibial pulses bilaterally, pulsatile fistula left upper arm Musculoskeletal: No deformity or edema  Neurologic: Upper and lower extremity motor 5/5 and symmetric  DATA:  Bilateral ABIs performed in our office today. 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 Bilateral peripheral arterial disease mildly asymptomatic at most as the patient is not very ambulatory. He currently is not at risk of limb loss. Any operation would be high risk due to redo operation because of the patient's overall debility and previous aortobifemoral bypass.   PLAN:  She will follow-up in one year with repeat ABIs. His  long as his symptoms do  not progress we will follow him and manage him medically.   Ruta Hinds, MD [facility] Office: [phone] Pager: [phone]

## 2016-06-29 DIAGNOSIS — N186 End stage renal disease: Secondary | ICD-10-CM | POA: Diagnosis not present

## 2016-06-29 DIAGNOSIS — N2581 Secondary hyperparathyroidism of renal origin: Secondary | ICD-10-CM | POA: Diagnosis not present

## 2016-06-29 DIAGNOSIS — D631 Anemia in chronic kidney disease: Secondary | ICD-10-CM | POA: Diagnosis not present

## 2016-07-02 DIAGNOSIS — D631 Anemia in chronic kidney disease: Secondary | ICD-10-CM | POA: Diagnosis not present

## 2016-07-02 DIAGNOSIS — N186 End stage renal disease: Secondary | ICD-10-CM | POA: Diagnosis not present

## 2016-07-02 DIAGNOSIS — N2581 Secondary hyperparathyroidism of renal origin: Secondary | ICD-10-CM | POA: Diagnosis not present

## 2016-07-04 DIAGNOSIS — N186 End stage renal disease: Secondary | ICD-10-CM | POA: Diagnosis not present

## 2016-07-04 DIAGNOSIS — D631 Anemia in chronic kidney disease: Secondary | ICD-10-CM | POA: Diagnosis not present

## 2016-07-04 DIAGNOSIS — N2581 Secondary hyperparathyroidism of renal origin: Secondary | ICD-10-CM | POA: Diagnosis not present

## 2016-07-05 ENCOUNTER — Ambulatory Visit: Payer: Medicare Other | Attending: Internal Medicine | Admitting: Physical Therapy

## 2016-07-05 DIAGNOSIS — R2681 Unsteadiness on feet: Secondary | ICD-10-CM | POA: Insufficient documentation

## 2016-07-05 DIAGNOSIS — R2689 Other abnormalities of gait and mobility: Secondary | ICD-10-CM | POA: Insufficient documentation

## 2016-07-05 DIAGNOSIS — M6281 Muscle weakness (generalized): Secondary | ICD-10-CM | POA: Insufficient documentation

## 2016-07-06 DIAGNOSIS — N2581 Secondary hyperparathyroidism of renal origin: Secondary | ICD-10-CM | POA: Diagnosis not present

## 2016-07-06 DIAGNOSIS — N186 End stage renal disease: Secondary | ICD-10-CM | POA: Diagnosis not present

## 2016-07-06 DIAGNOSIS — D631 Anemia in chronic kidney disease: Secondary | ICD-10-CM | POA: Diagnosis not present

## 2016-07-09 DIAGNOSIS — N186 End stage renal disease: Secondary | ICD-10-CM | POA: Diagnosis not present

## 2016-07-09 DIAGNOSIS — D631 Anemia in chronic kidney disease: Secondary | ICD-10-CM | POA: Diagnosis not present

## 2016-07-09 DIAGNOSIS — N2581 Secondary hyperparathyroidism of renal origin: Secondary | ICD-10-CM | POA: Diagnosis not present

## 2016-07-10 ENCOUNTER — Other Ambulatory Visit (INDEPENDENT_AMBULATORY_CARE_PROVIDER_SITE_OTHER): Payer: Medicare Other

## 2016-07-10 DIAGNOSIS — Z794 Long term (current) use of insulin: Secondary | ICD-10-CM | POA: Diagnosis not present

## 2016-07-10 DIAGNOSIS — E1165 Type 2 diabetes mellitus with hyperglycemia: Secondary | ICD-10-CM

## 2016-07-10 LAB — GLUCOSE, RANDOM: Glucose, Bld: 271 mg/dL — ABNORMAL HIGH (ref 70–99)

## 2016-07-10 LAB — HEMOGLOBIN A1C: Hgb A1c MFr Bld: 7.5 % — ABNORMAL HIGH (ref 4.6–6.5)

## 2016-07-11 ENCOUNTER — Encounter: Payer: Self-pay | Admitting: Nurse Practitioner

## 2016-07-11 ENCOUNTER — Ambulatory Visit (INDEPENDENT_AMBULATORY_CARE_PROVIDER_SITE_OTHER): Payer: Medicare Other | Admitting: Nurse Practitioner

## 2016-07-11 VITALS — BP 164/61 | HR 67 | Ht 71.0 in | Wt 202.6 lb

## 2016-07-11 DIAGNOSIS — N2581 Secondary hyperparathyroidism of renal origin: Secondary | ICD-10-CM | POA: Diagnosis not present

## 2016-07-11 DIAGNOSIS — G3184 Mild cognitive impairment, so stated: Secondary | ICD-10-CM

## 2016-07-11 DIAGNOSIS — D631 Anemia in chronic kidney disease: Secondary | ICD-10-CM | POA: Diagnosis not present

## 2016-07-11 DIAGNOSIS — N186 End stage renal disease: Secondary | ICD-10-CM | POA: Diagnosis not present

## 2016-07-11 DIAGNOSIS — I10 Essential (primary) hypertension: Secondary | ICD-10-CM

## 2016-07-11 NOTE — Progress Notes (Signed)
[facility]  PATIENT: [name] DOB: 02-May-1941   REASON FOR VISIT: Follow-up for mild cognitive impairment HISTORY FROM: Patient    HISTORY OF PRESENT ILLNESS:UPDATE 10/18/2017CM [name], 75 year old male returns for follow-up. She has history of mild cognitive. MOCA is stable. He is currently on Aricept denies side effects to the medication. He gets his medications through the [place]. He goes to dialysis 3 times a week. He continues to be fairly active. He is planning a vacation to [place] next month. He feels his memory is stable He continues to drive without difficulty. He says he has not gotten lost. He returns for reevaluation   Interval history: 01/12/16 AAPatient no-showed for multiple (3) appointments and was discharged from our practice. I spoke to him and agreed to see him again wit the understanding that if he misses another appointment he will need to find another neurology group. He has been going through a lot recently, trying to get disability and his health has declined. He says he has too many appointments. He goes to Dr. Renold Genta and also goes to the [place]. He can follow with Dr. Renold Genta, doesn't need to follow with [place] unless he needs [place] if he feels he has too many doctors. He takes 641m of asa a day, not sure why, advised him this is not indicated for stroke prevention. 881mdaily. 65069maily may increase his risk of bleeding. He is on Aricept 28m26mily and we will refill this today. Last hgba1c 7.2. LDL 33. TSH wnl. B12 in July 2016 was 1068 (Hx of b12 deficiency). Memory is stable, in fact he is feeling a little better.   HPI: Johnny COVIELLOa 73 y51. male here as a referral from Dr. BaxlRenold Genta memory problems. PMHx peripheral vascular disease, hypertension, hypothyroidism, hyperparathyroidism, diabetes(uncontrolled since starting dialysis), end-stage renal disease on dialysis, hyperlipidemia, b-12 deficiency on injections (B12 237 at that time).  Recently he has noticed he can't catch onto things as quickly as he used to. The latter part of may her noticed it, when he was having more medical issues and he was gaining fluid and had a bout of pneumonia. His memory got really bad before he went into the hospital and was started on dialysis. Things are better since then. He is more alert and catching things better. Wife is here and provides much information, she says he can't keep up with his cell phone and keys and he loses them. He goes into the kitchen to make a snack and he forgets to close the cabinets. He is forgetting people's names, appointments. He used to always be able to monitor medications and he doesn't remember what he is taking his meds for now. He used to be very sharp with this meds but there is a big change. He misplaces checks. He does pay the bills. No accidents with the car or at home. No delusions or hallucinations. No changes in personality. He is more anxious because of the medical problems. Wife endorses depression. He still gets out and likes to do things, but the side effects of his medical conditions are hard for him to handle. He is frustrated. No loss of consciousness, no episodes of confusion, no staring spells. No other focal neurologic deficits. He worries a lot. Endorses snoring, excessive daytime fatigue and witnessed apneic events.     REVIEW OF SYSTEMS: Full 14 system review of systems performed and notable only for those listed, all others are neg:  Constitutional:  neg  Cardiovascular: neg Ear/Nose/Throat: neg  Skin: neg Eyes: neg Respiratory: neg Gastroitestinal: neg  Hematology/Lymphatic: neg  Endocrine: neg Musculoskeletal:neg Allergy/Immunology: neg Neurological: Mild cognitive impairment Psychiatric: neg Sleep : neg   ALLERGIES:PCN   HOME MEDICATIONS: Outpatient Medications Prior to Visit  Medication Sig Dispense Refill  . acetaminophen (TYLENOL) 500 MG tablet Take 500 mg by mouth every 6  (six) hours as needed (for pain).     Marland Kitchen allopurinol (ZYLOPRIM) 100 MG tablet Take 100 mg by mouth daily.      Marland Kitchen aspirin 325 MG tablet Take 650 mg by mouth daily.     . Blood Glucose Monitoring Suppl (FREESTYLE FREEDOM LITE) w/Device KIT Use to check blood sugar 2 times per day dx code E11.65 1 each 0  . clobetasol (TEMOVATE) 0.05 % external solution apply to affected area ON THE SCALP UP TO 2 TIMES A DAY AS NEEDED...  (REFER TO PRESCRIPTION NOTES).  0  . donepezil (ARICEPT) 10 MG tablet Take 1 tablet (10 mg total) by mouth at bedtime. 30 tablet 12  . FLUOCINOLONE ACETONIDE SCALP 0.01 % OIL apply to affected area ON SCALP EVERY NIGHT AT BEDTIME, WASH UPON AWAKENING  0  . fluticasone (CUTIVATE) 0.05 % cream apply to affected area ON FACE UP TO 2 TIMES A DAY AS NEEDED  0  . fluticasone (FLONASE) 50 MCG/ACT nasal spray Place 1 spray into both nostrils daily. (Patient taking differently: Place 1 spray into both nostrils daily as needed for allergies. ) 16 g 2  . glucose blood (ONETOUCH VERIO) test strip Use to check blood sugar 2 times per day. Dx code E11.65 100 each 2  . insulin aspart (NOVOLOG) 100 UNIT/ML injection Inject 10 Units into the skin 3 (three) times daily before meals. (Patient taking differently: Inject 5-8 Units into the skin See admin instructions. Two to three times a day before meals) 20 mL 2  . insulin glargine (LANTUS) 100 UNIT/ML injection Inject 0.08 mLs (8 Units total) into the skin every morning.    Marland Kitchen ketoconazole (NIZORAL) 2 % shampoo Apply 1 application topically 2 (two) times a week. 120 mL 0  . Lancets (FREESTYLE) lancets Use as instructed to check blood sugar 2 times per day dx code E11.65 100 each 3  . levothyroxine (SYNTHROID, LEVOTHROID) 50 MCG tablet Take 1 tablet (50 mcg total) by mouth daily. 90 tablet 1  . lidocaine-prilocaine (EMLA) cream Apply 1 application topically once. AS DIRECTED  1  . loratadine (CLARITIN) 10 MG tablet Take 10 mg by mouth daily as needed for  allergies.     . metroNIDAZOLE (METROGEL) 0.75 % gel APPLY TO AFFECTED AREA ON SKIN  ON FACE TWICE DAILY  0  . mupirocin ointment (BACTROBAN) 2 % Use as directed to affected areas 22 g prn  . Naftifine HCl (NAFTIN) 2 % CREA Apply to feet daily for fungal infection 60 g prn  . RENVELA 800 MG tablet Take 800-2,400 mg by mouth See admin instructions. 1,600-2,400 mg with breakfast then 1,600 mg with lunch then 2,400 mg with dinner (evening meal) and 800 mg with each snack  0  . SENSIPAR 30 MG tablet Take 30 mg by mouth daily.   1  . simvastatin (ZOCOR) 20 MG tablet Take 20 mg by mouth at bedtime.     . triamcinolone ointment (KENALOG) 0.1 % Apply 1 application topically 2 (two) times daily. Use between thighs    . atorvastatin (LIPITOR) 40 MG tablet Take 1 tablet (40 mg  total) by mouth daily. (Patient not taking: Reported on 07/11/2016) 30 tablet 0   No facility-administered medications prior to visit.     PAST MEDICAL HISTORY: Past Medical History:  Diagnosis Date  . Allergy   . Anemia   . Arthritis   . Coronary artery disease   . Diabetes mellitus    Type 2  . Diverticulitis   . ED (erectile dysfunction)   . Elevated homocysteine ([place])   . ESRD (end stage renal disease) on dialysis ([place]) 03/2015  . GERD (gastroesophageal reflux disease)    pepto   . Gout   . Hyperlipidemia   . Hypertension   . Hypothyroidism   . Pneumonia   . PVD (peripheral vascular disease) ([place])    has plastic aorta  . Renal insufficiency   . Seasonal allergies   . Shortness of breath dyspnea   . Sleep apnea   . Thyroid disease     PAST SURGICAL HISTORY: Past Surgical History:  Procedure Laterality Date  . aortobifemoral bypass    . AV FISTULA PLACEMENT Left 12/01/2013   Procedure: ARTERIOVENOUS (AV) FISTULA CREATION- LEFT BRACHIOCEPHALIC;  Surgeon: Angelia Mould, MD;  Location: [facility];  Service: Vascular;  Laterality: Left;  . [place] TRANSPOSITION Right 07/27/2014   Procedure: BASCILIC  VEIN TRANSPOSITION;  Surgeon: Angelia Mould, MD;  Location: [facility];  Service: Vascular;  Laterality: Right;  . BREAST SURGERY     left - granulomatous mastitis  . COLONOSCOPY    . 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 EYE SURGERY Bilateral    cataracts  . REVISON OF ARTERIOVENOUS FISTULA Left 02/09/2014   Procedure: REVISON OF LEFT ARTERIOVENOUS FISTULA - RESECTION OF RENDUNDANT VEIN;  Surgeon: Angelia Mould, MD;  Location: [facility];  Service: Vascular;  Laterality: Left;  . SBO with lysis adhesions    . SHUNTOGRAM Left 04/19/2014   Procedure: FISTULOGRAM;  Surgeon: Angelia Mould, MD;  [facility] CATH LAB;  Service: Cardiovascular;  Laterality: Left;  . UNILATERAL UPPER EXTREMEITY ANGIOGRAM N/A 07/12/2014   Procedure: UNILATERAL UPPER Anselmo Rod;  Surgeon: Angelia Mould, MD;  Location: [facility] CATH LAB;  Service: Cardiovascular;  Laterality: N/A;    FAMILY HISTORY: Family History  Problem Relation Age of Onset  . Aneurysm Mother   . Heart disease Father   . Stroke Father   . Hypertension Father   . Diabetes Father   . Dementia Neg Hx     SOCIAL HISTORY: Social History   Social History  . Marital status: Married    Spouse name: [name] Mood  . Number of children: 1  . Years of education: 63   Occupational History  . Retired    Social History Main Topics  . Smoking status: Former Smoker    Quit date: 09/24/1994  . Smokeless tobacco: Never Used  . Alcohol use No     Comment: Quit Oct. 1977 ("somewhat heavy")  . Drug use: No  . Sexual activity: Not on file   Other Topics Concern  . Not on file   Social History Narrative   Lives at home with wife.   Caffeine use: Drinks no soda or tea.    Drinks 1 cup coffee/week        PHYSICAL EXAM  Vitals:   07/11/16 0949  BP: (!) 164/61  Pulse: 67  Weight: 202 lb 9.6 oz (91.9 kg)  Height: 5' 11"  (1.803 m)   Body mass index is  28.26 kg/m.  Generalized: Well developed, in no  acute distress ,Well-groomed Head: normocephalic and atraumatic,. Oropharynx benign  Neck: Supple, no carotid bruits  Cardiac: Regular rate rhythm, no murmur  Musculoskeletal: No deformity   Neurological examination   Mentation: Alert oriented to time, place, history taking.  Follows all commands speech and language fluent.  Montreal Cognitive Assessment  07/11/2016 05/20/2015  Visuospatial/ Executive (0/5) 4 5  Naming (0/3) 3 2  Attention: Read list of digits (0/2) 1 1  Attention: Read list of letters (0/1) 1 1  Attention: Serial 7 subtraction starting at 100 (0/3) 3 1  Language: Repeat phrase (0/2) 2 2  Language : Fluency (0/1) 0 0  Abstraction (0/2) 1 1  Delayed Recall (0/5) 3 3  Orientation (0/6) 6 6  Total 24 22  Adjusted Score (based on education) - 22    Cranial nerve II-XII: Fundoscopic exam reveals sharp disc margins.Pupils were equal round reactive to light extraocular movements were full, visual field were full on confrontational test. Facial sensation and strength were normal. hearing was intact to finger rubbing bilaterally. Uvula tongue midline. head turning and shoulder shrug were normal and symmetric.Tongue protrusion into cheek strength was normal. Motor: normal bulk and tone, full strength in the BUE, BLE, fine finger movements normal, no pronator drift. No focal weakness Sensory: normal and symmetric to light touch, pinprick, and  Vibration, in the upper and lower extremities Coordination: finger-nose-finger, heel-to-shin bilaterally, no dysmetria Reflexes: Symmetric upper and lower, plantar responses were flexor bilaterally. Gait and Station: Rising up from seated position without assistance, normal stance,  moderate stride, good arm swing, smooth turning, able to perform tiptoe, and heel walking without difficulty. Tandem gait is steady  DIAGNOSTIC DATA (LABS, IMAGING, TESTING) - I reviewed patient records, labs, notes, testing and imaging myself where  available.  Lab Results  Component Value Date   WBC 6.9 06/14/2016   HGB 10.2 (L) 06/14/2016   HCT 31.0 (L) 06/14/2016   MCV 105.8 (H) 06/14/2016   PLT 177 06/14/2016      Component Value Date/Time   NA 139 06/14/2016 1313   K 3.8 06/14/2016 1313   CL 95 (L) 06/14/2016 1313   CO2 30 06/14/2016 1313   GLUCOSE 271 (H) 07/10/2016 0812   BUN 26 (H) 06/14/2016 1313   CREATININE 5.58 (H) 06/14/2016 1313   CALCIUM 9.2 06/14/2016 1313   PROT 6.1 06/14/2016 1313   ALBUMIN 3.8 06/14/2016 1313   AST 23 06/14/2016 1313   ALT 12 06/14/2016 1313   ALKPHOS 126 (H) 06/14/2016 1313   BILITOT 0.4 06/14/2016 1313   GFRNONAA 11 (L) 06/06/2016 0420   GFRNONAA 10 (L) 01/10/2016 1251   GFRAA 12 (L) 06/06/2016 0420   GFRAA 12 (L) 01/10/2016 1251   Lab Results  Component Value Date   CHOL 78 (L) 01/10/2016   HDL 31 (L) 01/10/2016   LDLCALC 33 01/10/2016   TRIG 68 01/10/2016   CHOLHDL 2.5 01/10/2016   Lab Results  Component Value Date   HGBA1C 7.5 (H) 07/10/2016   Lab Results  Component Value Date   VITAMINB12 1,116 (H) 06/05/2016   Lab Results  Component Value Date   TSH 1.43 06/14/2016      ASSESSMENT AND PLAN [name] is a very nice 75 y.o. male here as a referral from Dr. Renold Genta for memory problems. PMHx peripheral vascular disease, hypertension, hypothyroidism, hyperparathyroidism, diabetes, end-stage renal disease on dialysis, hyperlipidemia, b-12 deficiency on injections (B12 237 at that time).  Neurologic exam is nonfocal. Montreal cognitive assessment 24 out of 30. MRI of the brain showed nonspecific white matter changes that were mild for age. Discussion with patient that his memory complaints are likely multifactorial. There is probably some mild cognitive impairment that's normal for age but his medical problems are likely contributing significantly to his complaints. I discussed that medical conditions can definitely affect cognitive abilities. He had a  thorough  dementia workup. The patient is a current patient of [name]  who is out of the office today . This note is sent to the work in doctor.     PLAN: Continue Aricept at current dose, meds through the [facility] MOCA score is stable 24/30.  Follow-up with [place] in 6 months for repeat testing Dennie Bible, [facility], [facility], APRN  [facility] [address] [place], [place] [address] [phone]

## 2016-07-11 NOTE — Patient Instructions (Addendum)
Continue Aricept at current dose, meds through the [facility] MOCA score is stable 24/30.  Follow-up with [place] in 6 months for repeat testing

## 2016-07-12 ENCOUNTER — Ambulatory Visit: Payer: Medicare Other | Admitting: Physical Therapy

## 2016-07-12 DIAGNOSIS — R2681 Unsteadiness on feet: Secondary | ICD-10-CM | POA: Diagnosis not present

## 2016-07-12 DIAGNOSIS — R2689 Other abnormalities of gait and mobility: Secondary | ICD-10-CM

## 2016-07-12 DIAGNOSIS — M6281 Muscle weakness (generalized): Secondary | ICD-10-CM

## 2016-07-12 NOTE — Therapy (Signed)
[place] [address] [place], [place], [address] Phone: [phone]   Fax:  [phone]  Physical Therapy Evaluation  Patient Details  Name: [name] MRN: [id number] Date of Birth: 05-08-1941 Referring Provider: Oren Binet, MD (hospital) (family doctor is Tedra Senegal)  Encounter Date: 07/12/2016      PT End of Session - 07/12/16 1924    Visit Number 1   Number of Visits 9   Date for PT Re-Evaluation 09/09/16   Authorization Type Medicare primary/Tricare 2nd (NO PTAs); GCODE every 10th visit   PT Start Time 0806   PT Stop Time 0845   PT Time Calculation (min) 39 min   Activity Tolerance Patient tolerated treatment well   Behavior During Therapy [facility] for tasks assessed/performed      Past Medical History:  Diagnosis Date  . Allergy   . Anemia   . Arthritis   . Coronary artery disease   . Diabetes mellitus    Type 2  . Diverticulitis   . ED (erectile dysfunction)   . Elevated homocysteine ([place])   . ESRD (end stage renal disease) on dialysis ([place]) 03/2015  . GERD (gastroesophageal reflux disease)    pepto   . Gout   . Hyperlipidemia   . Hypertension   . Hypothyroidism   . Pneumonia   . PVD (peripheral vascular disease) ([place])    has plastic aorta  . Renal insufficiency   . Seasonal allergies   . Shortness of breath dyspnea   . Sleep apnea   . Thyroid disease     Past Surgical History:  Procedure Laterality Date  . aortobifemoral bypass    . AV FISTULA PLACEMENT Left 12/01/2013   Procedure: ARTERIOVENOUS (AV) FISTULA CREATION- LEFT BRACHIOCEPHALIC;  Surgeon: Angelia Mould, MD;  Location: [place];  Service: Vascular;  Laterality: Left;  . [place] TRANSPOSITION Right 07/27/2014   Procedure: BASCILIC VEIN TRANSPOSITION;  Surgeon: Angelia Mould, MD;  Location: [place];  Service: Vascular;  Laterality: Right;  . BREAST SURGERY     left - granulomatous mastitis  .  COLONOSCOPY    . 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 EYE SURGERY Bilateral    cataracts  . REVISON OF ARTERIOVENOUS FISTULA Left 02/09/2014   Procedure: REVISON OF LEFT ARTERIOVENOUS FISTULA - RESECTION OF RENDUNDANT VEIN;  Surgeon: Angelia Mould, MD;  Location: [place];  Service: Vascular;  Laterality: Left;  . SBO with lysis adhesions    . SHUNTOGRAM Left 04/19/2014   Procedure: FISTULOGRAM;  Surgeon: Angelia Mould, MD;  Location: [facility] C[facility] LAB;  Service: Cardiovascular;  Laterality: Left;  . UNILATERAL UPPER EXTREMEITY ANGIOGRAM N/A 07/12/2014   Procedure: UNILATERAL UPPER Anselmo Rod;  Surgeon: Angelia Mould, MD;  Location: [facility] CATH LAB;  Service: Cardiovascular;  Laterality: N/A;    There were no vitals filed for this visit.       Subjective Assessment - 07/12/16 0811    Subjective Pt is a 75 year old male who was hospitalized September 2017 due to SOB and L sided numbness.  Work-up was negative for stroke.  Pt reports 2 falls in the past 6 months.  He does not use assistive device.   Pertinent History on dialysis M, W, F, peripheral artery disease, myalgia, diabetes, chronic kidney disease   Patient Stated Goals Pt's goal for therapy would be to get strength in arms and legs.     Currently in  Pain? No/denies            [employer] PT Assessment - 07/12/16 0001      Assessment   Medical Diagnosis SOB, falls, poor balance   Referring Provider Oren Binet, MD (hospital)  family doctor is Tedra Senegal   Onset Date/Surgical Date 06/04/16  hospitalized     Precautions   Precautions Fall   Precaution Comments On dialysis M, W, F     Balance Screen   Has the patient fallen in the past 6 months Yes   How many times? 2   Has the patient had a decrease in activity level because of a fear of falling?  No   Is the patient reluctant to leave their home because of a fear of falling?  No     Home Ecologist  residence   Living Arrangements Spouse/significant other   Available Help at Discharge Family   Type of [place] to enter   Entrance Stairs-Number of Steps 6   Entrance Stairs-Rails Right   [place] Two level;Able to live on main level with bedroom/bathroom   Home Equipment None     Prior Function   Level of Independence Independent with basic ADLs;Independent with community mobility without device;Independent with household mobility without device   Vocation Retired   Leisure Enjoys travelling, walks around yard, participates in church activities     Observation/Other Assessments   Focus on Therapeutic Outcomes (FOTO)  NP     ROM / Strength   AROM / PROM / Strength Strength     Strength   Overall Strength Deficits   Overall Strength Comments Grossly tested 4/5 quads, hamstrings, dorsiflexors bilaterally; hip flexion 3+/5 bilaterally.  Pt reports fatigue after MMT     Transfers   Transfers Sit to Stand;Stand to Sit   Sit to Stand 6: Modified independent (Device/Increase time);Without upper extremity assist;From chair/3-in-1   Five time sit to stand comments  18.16   Stand to Sit 6: Modified independent (Device/Increase time);Without upper extremity assist;To chair/3-in-1     Ambulation/Gait   Ambulation/Gait Yes   Ambulation/Gait Assistance 6: Modified independent (Device/Increase time)   Ambulation Distance (Feet) 150 Feet   Assistive device None   Gait Pattern Step-through pattern;Decreased step length - right;Decreased step length - left;Trendelenburg;Decreased trunk rotation   Ambulation Surface Level;Indoor   Gait velocity 11.26 sec = 2.91 ft/sec     Standardized Balance Assessment   Standardized Balance Assessment Timed Up and Go Test;Dynamic Gait Index;Berg Balance Test     Berg Balance Test   Sit to Stand Able to stand without using hands and stabilize independently   Standing Unsupported Able to stand safely 2 minutes   Sitting with  Back Unsupported but Feet Supported on Floor or Stool Able to sit safely and securely 2 minutes   Stand to Sit Sits safely with minimal use of hands   Transfers Able to transfer safely, minor use of hands   Standing Unsupported with Eyes Closed Able to stand 10 seconds safely   Standing Ubsupported with Feet Together Able to place feet together independently and stand 1 minute safely   From Standing, Reach Forward with Outstretched Arm Can reach confidently >25 cm (10")   From Standing Position, Pick up Object from Floor Able to pick up shoe safely and easily   From Standing Position, Turn to Look Behind Over each Shoulder Looks behind from both sides and weight shifts well  Turn 360 Degrees Able to turn 360 degrees safely one side only in 4 seconds or less   Standing Unsupported, Alternately Place Feet on Step/Stool Able to stand independently and safely and complete 8 steps in 20 seconds   Standing Unsupported, One Foot in Front Able to place foot tandem independently and hold 30 seconds   Standing on One Leg Tries to lift leg/unable to hold 3 seconds but remains standing independently  1.3 LLE, 1.12 sec RLE   Total Score 52     Dynamic Gait Index   Level Surface Normal   Change in Gait Speed Normal   Gait with Horizontal Head Turns Mild Impairment   Gait with Vertical Head Turns Mild Impairment   Gait and Pivot Turn Normal   Step Over Obstacle Moderate Impairment   Step Around Obstacles Mild Impairment   Steps Mild Impairment  uses rails descending   Total Score 18   DGI comment: Scores <19/24 are indicative of increased fall risk.     Timed Up and Go Test   Normal TUG (seconds) 14.76                                PT Long Term Goals - 07/12/16 1934      PT LONG TERM GOAL #1   Title Pt will be independent with HEP for imrpoved balance, strength and gait.  TARGET 08/11/16   Time 4   Period Weeks   Status New     PT LONG TERM GOAL #2   Title Pt will  improve TUG score to less than or equal to 13.5 seconds for decreased fall risk.   Time 4   Period Weeks   Status New     PT LONG TERM GOAL #3   Title Pt will improve Dynamic Gait Index score to at least 20/24 for decreased fall risk.   Time 4   Period Weeks   Status New     PT LONG TERM GOAL #4   Title Pt will improve 5x sit<>stand transfers to less than or equal to 16 seconds for improved efficiency and safety with transfers.   Time 4   Period Weeks   Status New     PT LONG TERM GOAL #5   Title Pt will ambulate at least 1000 ft indoor and outdoor surfaces, independently/no LOB for improved outdoor and community gait.   Time 4   Period Weeks   Status New               Plan - 07/12/16 1924    Clinical Impression Statement Pt is a 75 year old male who presents to OP PT following hospitalization in September for SOB and R sided numbness, referral to PT for falls and poor balance.  Pt has history of >3 co-morbidities, including chronic kidney disease on dialysis.  He has has 2 falls in the past 6 months.  He presents at fall risk per TUG and DGI scores.  He continues to be active in church and working around home and yard.  He would benefit from skilled PT to address the above stated deficits to improve functional mobility and decrease fall risk.   Rehab Potential Good   PT Frequency 2x / week   PT Duration 4 weeks   PT Treatment/Interventions ADLs/Self Care Home Management;Functional mobility training;Gait training;Therapeutic activities;Therapeutic exercise;Balance training;Neuromuscular re-education;Patient/family education   PT Next Visit Plan Initiate HEP for functional  lower extremity strengthening, dynamic balance and gait activities.  Check hip strength and add hip strengthening as needed.   Consulted and Agree with Plan of Care Patient      Patient will benefit from skilled therapeutic intervention in order to improve the following deficits and impairments:  Abnormal  gait, Decreased balance, Decreased strength, Decreased mobility, Difficulty walking  Visit Diagnosis: Other abnormalities of gait and mobility  Unsteadiness on feet  Muscle weakness (generalized)      G-Codes - 2016/08/03 1937    Functional Assessment Tool Used 5x sit<>stand 18.16 sec, TUG score 14.76 sec; DGI 18/24; 2 falls in the past 6 months   Functional Limitation Mobility: Walking and moving around   Mobility: Walking and Moving Around Current Status [phone]) At least 20 percent but less than 40 percent impaired, limited or restricted   Mobility: Walking and Moving Around Goal Status [phone]) At least 1 percent but less than 20 percent impaired, limited or restricted       Problem List Patient Active Problem List   Diagnosis Date Noted  . Dyspnea   . Macrocytic anemia 06/04/2016  . Thrombocytopenia ([place]) 06/04/2016  . Exertional dyspnea 06/04/2016  . Arm paresthesia, left 06/04/2016  . Dyspnea on exertion 06/04/2016  . Tenderness of right calf 06/04/2016  . Mild cognitive impairment with memory loss 01/12/2016  . ESRD on dialysis ([place]) 05/17/2015  . Hypoglycemia   . Weakness 02/18/2015  . OSA (obstructive sleep apnea) 02/02/2015  . Hyperkalemia 10/05/2014  . Chronic kidney disease (CKD), stage IV (severe) ([place]) 04/07/2014  . End stage renal disease ([place]) 11/18/2013  . BPH (benign prostatic hyperplasia) 11/22/2012  . Peripheral vascular disease ([place]) 12/24/2011  . Hyperparathyroidism 12/24/2011  . Hypothyroidism 12/24/2011  . Allergic rhinitis 12/24/2011  . Erectile dysfunction 12/24/2011  . Insulin dependent diabetes mellitus ([place]) 09/03/2008  . HYPERLIPIDEMIA 09/03/2008  . Essential hypertension 08/30/2008  . PANCREATITIS, HX OF 08/30/2008  . RENAL FAILURE, ACUTE, HX OF 08/30/2008  . DIVERTICULOSIS, COLON 06/08/2003    Clinton Wahlberg W. August 03, 2016, 7:38 PM Frazier Butt., PT [place] [address]  [place], [place], [address] Phone: [phone]   Fax:  [phone]  Name: [name] MRN: [id number] Date of Birth: [birth year]

## 2016-07-13 ENCOUNTER — Ambulatory Visit: Payer: Medicare Other | Admitting: Physical Therapy

## 2016-07-13 DIAGNOSIS — N186 End stage renal disease: Secondary | ICD-10-CM | POA: Diagnosis not present

## 2016-07-13 DIAGNOSIS — N2581 Secondary hyperparathyroidism of renal origin: Secondary | ICD-10-CM | POA: Diagnosis not present

## 2016-07-13 DIAGNOSIS — D631 Anemia in chronic kidney disease: Secondary | ICD-10-CM | POA: Diagnosis not present

## 2016-07-13 NOTE — Progress Notes (Signed)
I have reviewed and agreed above plan. 

## 2016-07-16 DIAGNOSIS — N2581 Secondary hyperparathyroidism of renal origin: Secondary | ICD-10-CM | POA: Diagnosis not present

## 2016-07-16 DIAGNOSIS — N186 End stage renal disease: Secondary | ICD-10-CM | POA: Diagnosis not present

## 2016-07-16 DIAGNOSIS — D631 Anemia in chronic kidney disease: Secondary | ICD-10-CM | POA: Diagnosis not present

## 2016-07-17 ENCOUNTER — Ambulatory Visit: Payer: Medicare Other | Admitting: Physical Therapy

## 2016-07-17 ENCOUNTER — Encounter: Payer: Self-pay | Admitting: Endocrinology

## 2016-07-17 ENCOUNTER — Ambulatory Visit (INDEPENDENT_AMBULATORY_CARE_PROVIDER_SITE_OTHER): Payer: Medicare Other | Admitting: Endocrinology

## 2016-07-17 ENCOUNTER — Encounter: Payer: Self-pay | Admitting: Physical Therapy

## 2016-07-17 VITALS — BP 150/75 | HR 64

## 2016-07-17 VITALS — BP 132/68 | HR 67 | Temp 98.0°F | Resp 16 | Ht 71.0 in | Wt 201.0 lb

## 2016-07-17 DIAGNOSIS — R2681 Unsteadiness on feet: Secondary | ICD-10-CM | POA: Diagnosis not present

## 2016-07-17 DIAGNOSIS — E1165 Type 2 diabetes mellitus with hyperglycemia: Secondary | ICD-10-CM | POA: Diagnosis not present

## 2016-07-17 DIAGNOSIS — R2689 Other abnormalities of gait and mobility: Secondary | ICD-10-CM

## 2016-07-17 DIAGNOSIS — Z794 Long term (current) use of insulin: Secondary | ICD-10-CM | POA: Diagnosis not present

## 2016-07-17 DIAGNOSIS — M6281 Muscle weakness (generalized): Secondary | ICD-10-CM | POA: Diagnosis not present

## 2016-07-17 MED ORDER — PIOGLITAZONE HCL 30 MG PO TABS: 30.0000 mg | ORAL_TABLET | Freq: Every day | ORAL | 2 refills | Status: DC

## 2016-07-17 NOTE — Progress Notes (Signed)
Patient ID: [name], male   DOB: 1941/06/05, 75 y.o.   MRN: [id number]           Reason for Appointment:  Follow-up for Type 2 Diabetes  Referring physician: [place]  History of Present Illness:          Date of diagnosis of type 2 diabetes mellitus :        Background history:  He has had long-standing diabetes probably treated with metformin initially and subsequently with sulfonylurea drugs At some point he was also given Actos in addition to his glipizide which he is still taking Records of his control are only available for the last 4 years Over the last few years his A1c has been generally in the 6-7% range His A1c was 6.2 in May 2016 and apparently was starting to get low normal blood sugars at times He was admitted to the hospital for fluid overload and renal failure and at that time his Actos was stopped Subsequently his blood sugars have been markedly increased He was switched from regular insulin to NovoLog on his initial consultation  Recent history:    His last A1c is 7.5 and previously as high as 8.9  INSULIN regimen is described as: Lantus 10 units in am; 8 units Novolog ac bid with syringe    Dialysis is done at 12 noon, doing it on Monday/Wednesday/Fridays       Current blood sugar patterns and problems identified:  He has not been compliant with his follow-up and has not been seen since June  He says that he was told not to take Actos when he was admitted to the hospital in September because his blood sugar was low on the review of his records indicate that glucose in the emergency room was 215.  His blood sugars appear to be higher recently but mostly after meals; previously was having tendency to overnight hypoglycemia and his Novolog was reduced by 2 units to 8  Although his monitor has incorrect time on it he is apparently checking his blood sugars mostly fasting  Fasting readings are mildly increased overall  He has a few readings later  afternoon and occasionally in the evening that are usually high  He thinks he is watching his diet fairly well  He also thinks that his blood sugars are higher when he comes back from dialysis in the afternoon, yesterday was 311.  He only has a snack in the afternoon when no dialysis  Oral hypoglycemic drugs the patient is taking are: not on Actos 30 mg daily in a.m.     Side effects from medications have been: None  Compliance with the medical regimen: Good   Glucose monitoring:  done 0-2  times a day         Glucometer:  Freestyle  Blood Glucose readings by review of home monitoring:   Mean values apply above for all meters except median for One Touch  PRE-MEAL Fasting Lunch Dinner Bedtime Overall  Glucose range: 135-179   135-311  205-401    Mean/median: 155     191     Self-care: The diet that the patient has been following is: tries to limit sweets, portions. eating out only about once a week at a steak house        Typical meal intake: Breakfast is  eggs and protein shake at 9 am, usually light lunch with mostly snacks like peanut butter crackers or half sandwich; supper 8 pm   Lunch  is irregular on nondialysis days but has a Larger lunch on Sundays             Dietician visit, most recent: ?               Exercise: none, not able to do much   Weight history:  Wt Readings from Last 3 Encounters:  07/17/16 201 lb (91.2 kg)  07/11/16 202 lb 9.6 oz (91.9 kg)  06/28/16 202 lb (91.6 kg)    Glycemic control:   Lab Results  Component Value Date   HGBA1C 7.5 (H) 07/10/2016   HGBA1C 7.4 (H) 06/14/2016   HGBA1C 7.2 (H) 01/10/2016   Lab Results  Component Value Date   MICROALBUR 354.8 (H) 02/14/2015   LDLCALC 33 01/10/2016   CREATININE 5.58 (H) 06/14/2016         Medication List       Accurate as of 07/17/16  4:04 PM. Always use your most recent med list.          acetaminophen 500 MG tablet Commonly known as:  TYLENOL Take 500 mg by mouth every 6 (six)  hours as needed (for pain).   allopurinol 100 MG tablet Commonly known as:  ZYLOPRIM Take 100 mg by mouth daily.   aspirin 325 MG tablet Take 650 mg by mouth daily.   clobetasol 0.05 % external solution Commonly known as:  TEMOVATE apply to affected area ON THE SCALP UP TO 2 TIMES A DAY AS NEEDED...  (REFER TO PRESCRIPTION NOTES).   donepezil 10 MG tablet Commonly known as:  ARICEPT Take 1 tablet (10 mg total) by mouth at bedtime.   Fluocinolone Acetonide Scalp 0.01 % Oil apply to affected area ON SCALP EVERY NIGHT AT BEDTIME, WASH UPON AWAKENING   fluticasone 0.05 % cream Commonly known as:  CUTIVATE apply to affected area ON FACE UP TO 2 TIMES A DAY AS NEEDED   fluticasone 50 MCG/ACT nasal spray Commonly known as:  FLONASE Place 1 spray into both nostrils daily.   FREESTYLE FREEDOM LITE w/Device Kit Use to check blood sugar 2 times per day dx code E11.65   freestyle lancets Use as instructed to check blood sugar 2 times per day dx code E11.65   glucose blood test strip Commonly known as:  ONETOUCH VERIO Use to check blood sugar 2 times per day. Dx code E11.65   insulin aspart 100 UNIT/ML injection Commonly known as:  novoLOG Inject 10 Units into the skin 3 (three) times daily before meals.   insulin glargine 100 UNIT/ML injection Commonly known as:  LANTUS Inject 0.08 mLs (8 Units total) into the skin every morning.   ketoconazole 2 % shampoo Commonly known as:  NIZORAL Apply 1 application topically 2 (two) times a week.   levothyroxine 50 MCG tablet Commonly known as:  SYNTHROID, LEVOTHROID Take 1 tablet (50 mcg total) by mouth daily.   lidocaine-prilocaine cream Commonly known as:  EMLA Apply 1 application topically once. AS DIRECTED   loratadine 10 MG tablet Commonly known as:  CLARITIN Take 10 mg by mouth daily as needed for allergies.   metroNIDAZOLE 0.75 % gel Commonly known as:  METROGEL APPLY TO AFFECTED AREA ON SKIN  ON FACE TWICE DAILY     mupirocin ointment 2 % Commonly known as:  BACTROBAN Use as directed to affected areas   Naftifine HCl 2 % Crea Commonly known as:  NAFTIN Apply to feet daily for fungal infection   pioglitazone 30 MG tablet Commonly known  as:  ACTOS Take 1 tablet (30 mg total) by mouth daily.   RENVELA 800 MG tablet Generic drug:  sevelamer carbonate Take 800-2,400 mg by mouth See admin instructions. 1,600-2,400 mg with breakfast then 1,600 mg with lunch then 2,400 mg with dinner (evening meal) and 800 mg with each snack   SENSIPAR 30 MG tablet Generic drug:  cinacalcet Take 30 mg by mouth daily.   simvastatin 20 MG tablet Commonly known as:  ZOCOR Take 20 mg by mouth at bedtime.   triamcinolone ointment 0.1 % Commonly known as:  KENALOG Apply 1 application topically 2 (two) times daily. Use between thighs       Allergies:  Allergies  Allergen Reactions  . Penicillins Rash    Has patient had a PCN reaction causing immediate rash, facial/tongue/throat swelling, SOB or lightheadedness with hypotension: Yes Has patient had a PCN reaction causing severe rash involving mucus membranes or skin necrosis: Yes Has patient had a PCN reaction that required hospitalization: No Has patient had a PCN reaction occurring within the last 10 years: No If all of the above answers are "NO", then may proceed with Cephalosporin use.     Past Medical History:  Diagnosis Date  . Allergy   . Anemia   . Arthritis   . Coronary artery disease   . Diabetes mellitus    Type 2  . Diverticulitis   . ED (erectile dysfunction)   . Elevated homocysteine ([place])   . ESRD (end stage renal disease) on dialysis ([place]) 03/2015  . GERD (gastroesophageal reflux disease)    pepto   . Gout   . Hyperlipidemia   . Hypertension   . Hypothyroidism   . Pneumonia   . PVD (peripheral vascular disease) ([place])    has plastic aorta  . Renal insufficiency   . Seasonal allergies   . Shortness of breath dyspnea   . Sleep  apnea   . Thyroid disease     Past Surgical History:  Procedure Laterality Date  . aortobifemoral bypass    . AV FISTULA PLACEMENT Left 12/01/2013   Procedure: ARTERIOVENOUS (AV) FISTULA CREATION- LEFT BRACHIOCEPHALIC;  Surgeon: Angelia Mould, MD;  Location: [place];  Service: Vascular;  Laterality: Left;  . [place] TRANSPOSITION Right 07/27/2014   Procedure: BASCILIC VEIN TRANSPOSITION;  Surgeon: Angelia Mould, MD;  Location: [place];  Service: Vascular;  Laterality: Right;  . BREAST SURGERY     left - granulomatous mastitis  . COLONOSCOPY    . 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 EYE SURGERY Bilateral    cataracts  . REVISON OF ARTERIOVENOUS FISTULA Left 02/09/2014   Procedure: REVISON OF LEFT ARTERIOVENOUS FISTULA - RESECTION OF RENDUNDANT VEIN;  Surgeon: Angelia Mould, MD;  Location: [place];  Service: Vascular;  Laterality: Left;  . SBO with lysis adhesions    . SHUNTOGRAM Left 04/19/2014   Procedure: FISTULOGRAM;  Surgeon: Angelia Mould, MD;  Location: [facility] CATH LAB;  Service: Cardiovascular;  Laterality: Left;  . UNILATERAL UPPER EXTREMEITY ANGIOGRAM N/A 07/12/2014   Procedure: UNILATERAL UPPER Anselmo Rod;  Surgeon: Angelia Mould, MD;  Location: [facility] CATH LAB;  Service: Cardiovascular;  Laterality: N/A;    Family History  Problem Relation Age of Onset  . Aneurysm Mother   . Heart disease Father   . Stroke Father   . Hypertension Father   . Diabetes Father   . Dementia Neg Hx     Social  History:  reports that he quit smoking about 21 years ago. He has never used smokeless tobacco. He reports that he does not drink alcohol or use drugs.    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  Latest known visit with results is:  Lab on 07/10/2016  Component Date Value Ref Range Status  . Hgb A1c MFr Bld 07/10/2016 7.5* 4.6 - 6.5 % Final  . Glucose, Bld 07/10/2016 271* 70 - 99 mg/dL Final    Physical Examination:  BP 132/68   Pulse 67   Temp 98 F (36.7 C)   Resp 16   Ht _0  (1.803 m)   Wt 201 lb (91.2 kg)   SpO2 95%   BMI 28.03 kg/m       ASSESSMENT:  Diabetes type 2, uncontrolled See history of present illness for detailed discussion of his current management, blood sugar patterns and problems identified  He again has higher readings when he goes off Actos which was stopped again in the hospital This has been working safely for him, no history of CHF  Blood sugars are markedly increased postprandially However not checking readings after meals consistently A1c appears to be lower than expected of his home readings, now 7.5  PLAN:   He will check his blood sugars more often after meals  Time on his glucose monitor was adjusted  Restart Actos, prior authorization to be done as insurance does not allow this despite his having this before  Will not change his insulin unless blood sugars continue to be high  He will take 5 units of NovoLog before getting his snacks at dialysis  No change in Lantus  He will call if he is having unusually low or high readings before his next visit in 6 weeks  Encouraged him to have balanced meals including at breakfast   Patient Instructions  Restart  Actos  5 Units Novolog for snacks at dialysis  More sugars 2 hrs after dinner   Counseling time on subjects discussed above is over 50% of today's 25 minute visit      Gerilyn Stargell 07/17/2016, 4:04 PM   Note: This office note was prepared  with Dragon voice recognition system technology. Any transcriptional errors that result from this process are unintentional.

## 2016-07-17 NOTE — Therapy (Signed)
[facility] [address] [place], [place], [address] Phone: [phone]   Fax:  [phone]  Physical Therapy Treatment  Patient Details  Name: [name] MRN: [id number] Date of Birth: Apr 23, 1941 Referring Provider: Oren Binet, MD (hospital) (family doctor is Tedra Senegal)  Encounter Date: 07/17/2016      PT End of Session - 07/17/16 1643    Visit Number 2   Number of Visits 9   Date for PT Re-Evaluation 09/09/16   Authorization Type Medicare primary/Tricare 2nd (NO PTAs); GCODE every 10th visit   PT Start Time 1535   PT Stop Time 1617   PT Time Calculation (min) 42 min   Equipment Utilized During Treatment Gait belt   Activity Tolerance Patient tolerated treatment well;Patient limited by fatigue   Behavior During Therapy [facility] for tasks assessed/performed      Past Medical History:  Diagnosis Date  . Allergy   . Anemia   . Arthritis   . Coronary artery disease   . Diabetes mellitus    Type 2  . Diverticulitis   . ED (erectile dysfunction)   . Elevated homocysteine ([place])   . ESRD (end stage renal disease) on dialysis ([place]) 03/2015  . GERD (gastroesophageal reflux disease)    pepto   . Gout   . Hyperlipidemia   . Hypertension   . Hypothyroidism   . Pneumonia   . PVD (peripheral vascular disease) ([place])    has plastic aorta  . Renal insufficiency   . Seasonal allergies   . Shortness of breath dyspnea   . Sleep apnea   . Thyroid disease     Past Surgical History:  Procedure Laterality Date  . aortobifemoral bypass    . AV FISTULA PLACEMENT Left 12/01/2013   Procedure: ARTERIOVENOUS (AV) FISTULA CREATION- LEFT BRACHIOCEPHALIC;  Surgeon: Angelia Mould, MD;  Location: [place];  Service: Vascular;  Laterality: Left;  . [place] TRANSPOSITION Right 07/27/2014   Procedure: BASCILIC VEIN TRANSPOSITION;  Surgeon: Angelia Mould, MD;  Location: [facility];  Service: Vascular;  Laterality:  Right;  . BREAST SURGERY     left - granulomatous mastitis  . COLONOSCOPY    . 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 EYE SURGERY Bilateral    cataracts  . REVISON OF ARTERIOVENOUS FISTULA Left 02/09/2014   Procedure: REVISON OF LEFT ARTERIOVENOUS FISTULA - RESECTION OF RENDUNDANT VEIN;  Surgeon: Angelia Mould, MD;  Location: [facility];  Service: Vascular;  Laterality: Left;  . SBO with lysis adhesions    . SHUNTOGRAM Left 04/19/2014   Procedure: FISTULOGRAM;  Surgeon: Angelia Mould, MD;  Location: [facility] CATH LAB;  Service: Cardiovascular;  Laterality: Left;  . UNILATERAL UPPER EXTREMEITY ANGIOGRAM N/A 07/12/2014   Procedure: UNILATERAL UPPER Anselmo Rod;  Surgeon: Angelia Mould, MD;  Location: [facility] CATH LAB;  Service: Cardiovascular;  Laterality: N/A;    Vitals:   07/17/16 1602  BP: (!) 150/75  Pulse: 64        Subjective Assessment - 07/17/16 1538    Subjective Pt reports no falls, no significant changes.    Pertinent History on dialysis M, W, F, peripheral artery disease, myalgia, diabetes, chronic kidney disease   Patient Stated Goals Pt's goal for therapy would be to get strength in arms and legs.     Currently in Pain? No/denies            [facility] PT Assessment - 07/17/16  0001      ROM / Strength   AROM / PROM / Strength Strength     Strength   Overall Strength Comments Further assessment of strength (based on gait pattern after fatigued) reveals 3+/5 strength bilaterally.                     [facility] Adult PT Treatment/Exercise - 07/17/16 1530      Bed Mobility   Bed Mobility Supine to Sit   Supine to Sit 5: Supervision   Supine to Sit Details (indicate cue type and reason) cueing for logroll technique to increase efficiency of movement, independence     Transfers   Transfers Sit to Stand;Stand to Sit;Stand Pivot Transfers   Sit to Stand 6: Modified independent (Device/Increase time);Without upper extremity  assist;From bed   Stand to Sit 6: Modified independent (Device/Increase time);Without upper extremity assist;To bed   Comments Pt performed sit<>stands from mat table at lowest setting to incr strength in B LE.  Verbal and demo cuing needed for sequencing and to control decent back down to the table.       Ambulation/Gait   Ambulation/Gait Yes   Ambulation/Gait Assistance 6: Modified independent (Device/Increase time)   Ambulation Distance (Feet) 600 Feet  1x600', 2x100'   Assistive device None   Gait Pattern Step-through pattern;Decreased arm swing - left;Decreased stride length;Decreased hip/knee flexion - right;Decreased hip/knee flexion - left;Lateral trunk lean to right;Lateral trunk lean to left;Trunk flexed   Ambulation Surface Level;Unlevel;Indoor;Outdoor;Paved   Curb 5: Supervision     High Level Balance   High Level Balance Activities Marching forwards   High Level Balance Comments Marching forwards at countertop with single UE support on counter.  Verbal and demo cuing for sequencing and proper form.     Exercises   Exercises Knee/Hip     Knee/Hip Exercises: Standing   Hip Abduction Stengthening;AROM;Both;1 set;5 reps;Knee straight   Abduction Limitations Verbal and demo cuing to avoid trunk flexion and maintain upright posture when performing exercise.             Balance Exercises - 07/17/16 1631      Balance Exercises: Standing   SLS Eyes open;Intermittent upper extremity support;10 secs  Intermittent UE support on countertop   Rockerboard Anterior/posterior;Head turns;EO;EC;10 reps;Intermittent UE support   Balance Beam Pt performed head turns (horizontal and vertical), EO, EC, and ant/post step downs on foam balance beam with intermittent UE support on parallel bars and supervision assist.            PT Education - 07/17/16 1616    Education provided Yes   Education Details Pt educated on new HEP.   Person(s) Educated Patient   Methods  Explanation;Demonstration;Verbal cues;Tactile cues;Handout   Comprehension Verbalized understanding;Returned demonstration             PT Long Term Goals - 07/12/16 1934      PT LONG TERM GOAL #1   Title Pt will be independent with HEP for imrpoved balance, strength and gait.  TARGET 08/11/16   Time 4   Period Weeks   Status New     PT LONG TERM GOAL #2   Title Pt will improve TUG score to less than or equal to 13.5 seconds for decreased fall risk.   Time 4   Period Weeks   Status New     PT LONG TERM GOAL #3   Title Pt will improve Dynamic Gait Index score to at least 20/24 for decreased  fall risk.   Time 4   Period Weeks   Status New     PT LONG TERM GOAL #4   Title Pt will improve 5x sit<>stand transfers to less than or equal to 16 seconds for improved efficiency and safety with transfers.   Time 4   Period Weeks   Status New     PT LONG TERM GOAL #5   Title Pt will ambulate at least 1000 ft indoor and outdoor surfaces, independently/no LOB for improved outdoor and community gait.   Time 4   Period Weeks   Status New               Plan - 07/17/16 1643    Clinical Impression Statement Pt performed well in today's session with the focus being to initiate a HEP to address pt's impairments and deficts.  Pt verbalized understanding and returned effective demonstration of all activities including implementing a walking program to improve endurance and strength.  He would continue to benefit from skilled PT to address his deficits.   Rehab Potential Good   PT Frequency 2x / week   PT Duration 4 weeks   PT Treatment/Interventions ADLs/Self Care Home Management;Functional mobility training;Gait training;Therapeutic activities;Therapeutic exercise;Balance training;Neuromuscular re-education;Patient/family education   PT Next Visit Plan Continue functional gait and balance activities; strengthen B LE   PT Home Exercise Plan SLS and marching at countertop,  sit<>stands, standing hip abd    Consulted and Agree with Plan of Care Patient      Patient will benefit from skilled therapeutic intervention in order to improve the following deficits and impairments:  Abnormal gait, Decreased balance, Decreased strength, Decreased mobility, Difficulty walking  Visit Diagnosis: Other abnormalities of gait and mobility  Unsteadiness on feet  Muscle weakness (generalized)     Problem List Patient Active Problem List   Diagnosis Date Noted  . Dyspnea   . Macrocytic anemia 06/04/2016  . Thrombocytopenia ([place]) 06/04/2016  . Exertional dyspnea 06/04/2016  . Arm paresthesia, left 06/04/2016  . Dyspnea on exertion 06/04/2016  . Tenderness of right calf 06/04/2016  . Mild cognitive impairment with memory loss 01/12/2016  . ESRD on dialysis ([place]) 05/17/2015  . Hypoglycemia   . Weakness 02/18/2015  . OSA (obstructive sleep apnea) 02/02/2015  . Hyperkalemia 10/05/2014  . Chronic kidney disease (CKD), stage IV (severe) ([place]) 04/07/2014  . End stage renal disease ([place]) 11/18/2013  . BPH (benign prostatic hyperplasia) 11/22/2012  . Peripheral vascular disease ([place]) 12/24/2011  . Hyperparathyroidism 12/24/2011  . Hypothyroidism 12/24/2011  . Allergic rhinitis 12/24/2011  . Erectile dysfunction 12/24/2011  . Insulin dependent diabetes mellitus ([place]) 09/03/2008  . HYPERLIPIDEMIA 09/03/2008  . Essential hypertension 08/30/2008  . PANCREATITIS, HX OF 08/30/2008  . RENAL FAILURE, ACUTE, HX OF 08/30/2008  . DIVERTICULOSIS, COLON 06/08/2003    Katherine Mantle, SPT 07/17/2016, 4:49 PM  [facility] [address] [place], [place], [address] Phone: [phone]   Fax:  [phone]  Name: [name] MRN: [id number] Date of Birth: 08/09/1941

## 2016-07-17 NOTE — Patient Instructions (Addendum)
Restart  Actos  5 Units Novolog for snacks at dialysis  More sugars 2 hrs after dinner

## 2016-07-17 NOTE — Patient Instructions (Addendum)
Walking Program:  Begin walking for exercise for 5 minutes, 1-2 times/day, 5 days/week.   Progress your walking program by adding 2 minutes to your routine each week, as tolerated. Be sure to wear good walking shoes, walk in a safe environment and only progress to your tolerance.      ABDUCTION: Standing (Power)    Stand facing your countertop with both hands on the counter. Don't use a weight on your ankle yet. Raise leg out to the side. Hold 3-5 seconds, then slowly return to resting position. Stay tall. Perform 10 reps, __1-2__ times per day on each leg.   FUNCTIONAL MOBILITY: Marching - Standing    Stand with one hand close to the countertop, using counter for balance, as needed. Walk the length of the countertop with high knees, holding each position for 2 seconds. Walk the length of your counter 10 times in this way, __1-2__ times per day.  Functional Quadriceps: Sit to Stand    Start by standing in front of a stable surface (heavy chair or bed) with feet shoulder-width apart. Perform a deep squat, touching your hips to the seat (but don't actually sit), then return to standing. Perform 10 reps, __1-2_ times per day.

## 2016-07-18 DIAGNOSIS — N2581 Secondary hyperparathyroidism of renal origin: Secondary | ICD-10-CM | POA: Diagnosis not present

## 2016-07-18 DIAGNOSIS — D631 Anemia in chronic kidney disease: Secondary | ICD-10-CM | POA: Diagnosis not present

## 2016-07-18 DIAGNOSIS — N186 End stage renal disease: Secondary | ICD-10-CM | POA: Diagnosis not present

## 2016-07-18 DIAGNOSIS — E1129 Type 2 diabetes mellitus with other diabetic kidney complication: Secondary | ICD-10-CM | POA: Diagnosis not present

## 2016-07-20 DIAGNOSIS — N186 End stage renal disease: Secondary | ICD-10-CM | POA: Diagnosis not present

## 2016-07-20 DIAGNOSIS — D631 Anemia in chronic kidney disease: Secondary | ICD-10-CM | POA: Diagnosis not present

## 2016-07-20 DIAGNOSIS — N2581 Secondary hyperparathyroidism of renal origin: Secondary | ICD-10-CM | POA: Diagnosis not present

## 2016-07-23 DIAGNOSIS — D631 Anemia in chronic kidney disease: Secondary | ICD-10-CM | POA: Diagnosis not present

## 2016-07-23 DIAGNOSIS — N2581 Secondary hyperparathyroidism of renal origin: Secondary | ICD-10-CM | POA: Diagnosis not present

## 2016-07-23 DIAGNOSIS — N186 End stage renal disease: Secondary | ICD-10-CM | POA: Diagnosis not present

## 2016-07-24 ENCOUNTER — Telehealth: Payer: Self-pay | Admitting: Physical Therapy

## 2016-07-24 ENCOUNTER — Ambulatory Visit: Payer: Medicare Other | Admitting: Physical Therapy

## 2016-07-24 DIAGNOSIS — E1122 Type 2 diabetes mellitus with diabetic chronic kidney disease: Secondary | ICD-10-CM | POA: Diagnosis not present

## 2016-07-24 DIAGNOSIS — Z992 Dependence on renal dialysis: Secondary | ICD-10-CM | POA: Diagnosis not present

## 2016-07-24 DIAGNOSIS — N186 End stage renal disease: Secondary | ICD-10-CM | POA: Diagnosis not present

## 2016-07-24 NOTE — Telephone Encounter (Signed)
Left voicemail notifying patient of missed PT appt today at 3:30 pm.  Billie Ruddy, PT, DPT [facility] [name] [place], [place], [address] Phone: [phone]   Fax:  [phone] 07/24/16, 3:55 PM

## 2016-07-25 DIAGNOSIS — D631 Anemia in chronic kidney disease: Secondary | ICD-10-CM | POA: Diagnosis not present

## 2016-07-25 DIAGNOSIS — N2581 Secondary hyperparathyroidism of renal origin: Secondary | ICD-10-CM | POA: Diagnosis not present

## 2016-07-25 DIAGNOSIS — E1129 Type 2 diabetes mellitus with other diabetic kidney complication: Secondary | ICD-10-CM | POA: Diagnosis not present

## 2016-07-25 DIAGNOSIS — N186 End stage renal disease: Secondary | ICD-10-CM | POA: Diagnosis not present

## 2016-07-27 ENCOUNTER — Ambulatory Visit: Payer: Medicare Other | Attending: Internal Medicine | Admitting: Physical Therapy

## 2016-07-27 VITALS — BP 142/64

## 2016-07-27 DIAGNOSIS — R2689 Other abnormalities of gait and mobility: Secondary | ICD-10-CM | POA: Diagnosis not present

## 2016-07-27 DIAGNOSIS — M6281 Muscle weakness (generalized): Secondary | ICD-10-CM

## 2016-07-27 DIAGNOSIS — R2681 Unsteadiness on feet: Secondary | ICD-10-CM | POA: Insufficient documentation

## 2016-07-27 DIAGNOSIS — E1129 Type 2 diabetes mellitus with other diabetic kidney complication: Secondary | ICD-10-CM | POA: Diagnosis not present

## 2016-07-27 DIAGNOSIS — N2581 Secondary hyperparathyroidism of renal origin: Secondary | ICD-10-CM | POA: Diagnosis not present

## 2016-07-27 DIAGNOSIS — N186 End stage renal disease: Secondary | ICD-10-CM | POA: Diagnosis not present

## 2016-07-27 DIAGNOSIS — D631 Anemia in chronic kidney disease: Secondary | ICD-10-CM | POA: Diagnosis not present

## 2016-07-27 NOTE — Patient Instructions (Signed)
Walking Program:  Begin walking for exercise for 5 minutes, 1-2 times/day, 5 days/week.   Progress your walking program by adding 2 minutes to your routine each week, as tolerated. Be sure to wear good walking shoes, walk in a safe environment and only progress to your tolerance.      Stabilization: Diaphragmatic Breathing    Lie with knees bent, feet flat. Place one hand on stomach, other on chest. Breathe deeply through nose, lifting belly hand without any motion of hand on chest. Try to breathe in for a count of 4, breathe out for a count of 5. Practice breathing in this way for about 3-4 minutes per day.    ABDUCTION: Standing (Power)    Stand facing your countertop with both hands on the counter. Don't use a weight on your ankle yet. Raise leg out to the side. Hold 3-5 seconds, then slowly return to resting position. Stay tall. Perform 10 reps, __1-2__ times per day on each leg.   FUNCTIONAL MOBILITY: Marching - Standing    Stand with one hand close to the countertop, using counter for balance, as needed. Walk the length of the countertop with high knees, holding each position for 2 seconds. Walk the length of your counter 10 times in this way, __1-2__ times per day.  Functional Quadriceps: Sit to Stand    Start by standing in front of a stable surface (heavy chair or bed) with feet shoulder-width apart. Perform a deep squat, touching the back of your hips to the seat (but don't actually sit), then return to standing. Perform 10 reps slowly, __1-2_ times per day. Be sure to take a breath every time you squat.

## 2016-07-27 NOTE — Therapy (Signed)
[facility] [address] [place], [place], [address] Phone: [phone]   Fax:  [phone]  Physical Therapy Treatment  Patient Details  Name: [name] MRN: [id number] Date of Birth: Mar 04, 1941 Referring Provider: Oren Binet, MD (hospital) (family doctor is Tedra [place])  Encounter Date: 07/27/2016      PT End of Session - 07/27/16 1310    Visit Number 3   Number of Visits 9   Date for PT Re-Evaluation 09/09/16   Authorization Type Medicare primary/Tricare 2nd (NO PTAs); GCODE every 10th visit   PT Start Time 0849   PT Stop Time 0931   PT Time Calculation (min) 42 min   Equipment Utilized During Treatment Gait belt   Activity Tolerance Patient tolerated treatment well;Patient limited by fatigue  frequent seated rest breaks required due ot SOB; SpO2 >96% throughout.    Behavior During Therapy [facility] for tasks assessed/performed      Past Medical History:  Diagnosis Date  . Allergy   . Anemia   . Arthritis   . Coronary artery disease   . Diabetes mellitus    Type 2  . Diverticulitis   . ED (erectile dysfunction)   . Elevated homocysteine ([place])   . ESRD (end stage renal disease) on dialysis ([place]) 03/2015  . GERD (gastroesophageal reflux disease)    pepto   . Gout   . Hyperlipidemia   . Hypertension   . Hypothyroidism   . Pneumonia   . PVD (peripheral vascular disease) ([place])    has plastic aorta  . Renal insufficiency   . Seasonal allergies   . Shortness of breath dyspnea   . Sleep apnea   . Thyroid disease     Past Surgical History:  Procedure Laterality Date  . aortobifemoral bypass    . AV FISTULA PLACEMENT Left 12/01/2013   Procedure: ARTERIOVENOUS (AV) FISTULA CREATION- LEFT BRACHIOCEPHALIC;  Surgeon: Angelia Mould, MD;  Location: [place];  Service: Vascular;  Laterality: Left;  . [place] TRANSPOSITION Right 07/27/2014   Procedure: BASCILIC VEIN TRANSPOSITION;  Surgeon:  Angelia Mould, MD;  Location: [place] [facility];  Service: Vascular;  Laterality: Right;  . BREAST SURGERY     left - granulomatous mastitis  . COLONOSCOPY    . 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 EYE SURGERY Bilateral    cataracts  . REVISON OF ARTERIOVENOUS FISTULA Left 02/09/2014   Procedure: REVISON OF LEFT ARTERIOVENOUS FISTULA - RESECTION OF RENDUNDANT VEIN;  Surgeon: Angelia Mould, MD;  Location: [facility];  Service: Vascular;  Laterality: Left;  . SBO with lysis adhesions    . SHUNTOGRAM Left 04/19/2014   Procedure: FISTULOGRAM;  Surgeon: Angelia Mould, MD;  Location[facility] CATH LAB;  Service: Cardiovascular;  Laterality: Left;  . UNILATERAL UPPER EXTREMEITY ANGIOGRAM N/A 07/12/2014   Procedure: UNILATERAL UPPER Anselmo Rod;  Surgeon: Angelia Mould, MD;  Location: [facility] CATH LAB;  Service: Cardiovascular;  Laterality: N/A;    Vitals:   07/27/16 0851  BP: (!) 142/64        Subjective Assessment - 07/27/16 0851    Subjective Pt has been feeling sick, had a root canal yesterday. Pt hasn't had a chance to start exercises due to having felt sick and had root canal.    Pertinent History * BP on L forearm only.   On dialysis M, W, F, peripheral artery disease, myalgia, diabetes, chronic kidney disease   Patient Stated  Goals Pt's goal for therapy would be to get strength in arms and legs.     Currently in Pain? Yes   Pain Score 3    Pain Location Mouth   Pain Orientation Left   Pain Descriptors / Indicators Sore   Pain Onset In the past 7 days   Pain Frequency Constant   Aggravating Factors  constant   Pain Relieving Factors Tylenol   Multiple Pain Sites No                         [facility] Adult PT Treatment/Exercise - 07/27/16 0001      Ambulation/Gait   Stairs Yes   Stairs Assistance 4: Min guard   Stairs Assistance Details (indicate cue type and reason) For dynamic SLS stability and LE strengthening, ascended 4 stairs x3  trials: ascending forward/descending retro, ascending/descending laterally with RLE leading, and ascending/descending laterally with LLE leading. Cueing for paced breathing throughout. Min guard for stability/balance.   Stair Management Technique No rails;Step to pattern;Sideways;Backwards;Forwards   Number of Stairs 12   Height of Stairs 3     Exercises   Exercises Other Exercises   Other Exercises  Supine: diaphragmatic breathing (inhalation x4 seconds, exhalation x5 seconds) with one hand on chest, one hand on abdomen for tactile feedback of diaphragmatic breathing. Reviewed standing deep squats (posterior hips touching standard chair) x10 reps with cueing for paced breathing; and standing B hip ABD x10 reps per side with cueing for technique, alignment.             Balance Exercises - 07/27/16 1305      Balance Exercises: Standing   Standing Eyes Closed Wide (BOA);Head turns;Foam/compliant surface;5 reps;Other (comment)  Airex; min guard   Balance Beam At // bars, performed forward/retro stepping onto/off of blue foam beam x5 reps per direction on each side with (S)-min guard.   Gait with Head Turns Forward;Retro;4 reps;Other (comment)  4 x30' while searching for cards in hallway   Other Standing Exercises Reviewed high-knee walking (2-sec holds) with intermittent finger tip support at countertop; cueing for technique.           PT Education - 07/27/16 1304    Education provided Yes   Education Details Reviewed current HEP. Education provided on diaphragmatic breathing in supinem hook lying and on paced breathing, pursed-lip breathing during mobility.    Person(s) Educated Patient   Methods Explanation;Demonstration;Tactile cues;Handout;Verbal cues   Comprehension Verbalized understanding;Returned demonstration;Need further instruction             PT Long Term Goals - 07/12/16 1934      PT LONG TERM GOAL #1   Title Pt will be independent with HEP for imrpoved  balance, strength and gait.  TARGET 08/11/16   Time 4   Period Weeks   Status New     PT LONG TERM GOAL #2   Title Pt will improve TUG score to less than or equal to 13.5 seconds for decreased fall risk.   Time 4   Period Weeks   Status New     PT LONG TERM GOAL #3   Title Pt will improve Dynamic Gait Index score to at least 20/24 for decreased fall risk.   Time 4   Period Weeks   Status New     PT LONG TERM GOAL #4   Title Pt will improve 5x sit<>stand transfers to less than or equal to 16 seconds for improved efficiency and safety  with transfers.   Time 4   Period Weeks   Status New     PT LONG TERM GOAL #5   Title Pt will ambulate at least 1000 ft indoor and outdoor surfaces, independently/no LOB for improved outdoor and community gait.   Time 4   Period Weeks   Status New               Plan - 07/27/16 1312    Clinical Impression Statement Session focused on reviewing HEP and provided education on diaphragmatic breathing, pursed-lip breathing, and pacing of breathing due to noted pt tendency to avoid breathing during physical activity. Reviewed HEP provided during previous session, as pt was unable to perform due to illness over past week. Pt continues to require frequent seated rest breaks due to SOB. SpO2 >96% and vital signs stable throughout this session.    Rehab Potential Good   PT Frequency 2x / week   PT Duration 4 weeks   PT Treatment/Interventions ADLs/Self Care Home Management;Functional mobility training;Gait training;Therapeutic activities;Therapeutic exercise;Balance training;Neuromuscular re-education;Patient/family education   PT Next Visit Plan Continue functional gait and balance activities, BLE strengthening, remind pt to breathe   PT Home Exercise Plan SLS and marching at countertop, sit<>stands, standing hip abd    Consulted and Agree with Plan of Care Patient      Patient will benefit from skilled therapeutic intervention in order to improve  the following deficits and impairments:  Abnormal gait, Decreased balance, Decreased strength, Decreased mobility, Difficulty walking  Visit Diagnosis: Other abnormalities of gait and mobility  Unsteadiness on feet  Muscle weakness (generalized)     Problem List Patient Active Problem List   Diagnosis Date Noted  . Dyspnea   . Macrocytic anemia 06/04/2016  . Thrombocytopenia ([place]) 06/04/2016  . Exertional dyspnea 06/04/2016  . Arm paresthesia, left 06/04/2016  . Dyspnea on exertion 06/04/2016  . Tenderness of right calf 06/04/2016  . Mild cognitive impairment with memory loss 01/12/2016  . ESRD on dialysis ([place]) 05/17/2015  . Hypoglycemia   . Weakness 02/18/2015  . OSA (obstructive sleep apnea) 02/02/2015  . Hyperkalemia 10/05/2014  . Chronic kidney disease (CKD), stage IV (severe) ([place]) 04/07/2014  . End stage renal disease ([place]) 11/18/2013  . BPH (benign prostatic hyperplasia) 11/22/2012  . Peripheral vascular disease ([place]) 12/24/2011  . Hyperparathyroidism 12/24/2011  . Hypothyroidism 12/24/2011  . Allergic rhinitis 12/24/2011  . Erectile dysfunction 12/24/2011  . Insulin dependent diabetes mellitus ([place]) 09/03/2008  . HYPERLIPIDEMIA 09/03/2008  . Essential hypertension 08/30/2008  . PANCREATITIS, HX OF 08/30/2008  . RENAL FAILURE, ACUTE, HX OF 08/30/2008  . DIVERTICULOSIS, COLON 06/08/2003    Billie Ruddy, PT, DPT [facility] [address] [place], [place], [address] Phone: [phone]   Fax:  [phone] 07/27/16, 1:15 PM  Name: [name] MRN: [id number] Date of Birth: Feb 04, 1941

## 2016-07-30 DIAGNOSIS — D631 Anemia in chronic kidney disease: Secondary | ICD-10-CM | POA: Diagnosis not present

## 2016-07-30 DIAGNOSIS — N186 End stage renal disease: Secondary | ICD-10-CM | POA: Diagnosis not present

## 2016-07-30 DIAGNOSIS — N2581 Secondary hyperparathyroidism of renal origin: Secondary | ICD-10-CM | POA: Diagnosis not present

## 2016-07-30 DIAGNOSIS — E1129 Type 2 diabetes mellitus with other diabetic kidney complication: Secondary | ICD-10-CM | POA: Diagnosis not present

## 2016-07-31 ENCOUNTER — Ambulatory Visit: Payer: Medicare Other | Admitting: Physical Therapy

## 2016-07-31 VITALS — BP 127/70 | HR 84

## 2016-07-31 DIAGNOSIS — R2681 Unsteadiness on feet: Secondary | ICD-10-CM

## 2016-07-31 DIAGNOSIS — R2689 Other abnormalities of gait and mobility: Secondary | ICD-10-CM

## 2016-07-31 DIAGNOSIS — M6281 Muscle weakness (generalized): Secondary | ICD-10-CM | POA: Diagnosis not present

## 2016-07-31 NOTE — Therapy (Signed)
[facility] [address] [place], [place], [address] Phone: [phone]   Fax:  [phone]  Physical Therapy Treatment  Patient Details  Name: [name] MRN: [id number] Date of Birth: [birth year] Referring Provider: Oren Binet, MD (hospital) (family doctor is Tedra Senegal)  Encounter Date: 07/31/2016      PT End of Session - 07/31/16 1528    Visit Number 4   Number of Visits 9   Date for PT Re-Evaluation 09/09/16   Authorization Type Medicare primary/Tricare 2nd (NO PTAs); GCODE every 10th visit   PT Start Time 0806  Pt arrived late to session.   PT Stop Time 0848   PT Time Calculation (min) 42 min   Equipment Utilized During Treatment Gait belt   Activity Tolerance Patient tolerated treatment well;Patient limited by fatigue   Behavior During Therapy WFL for tasks assessed/performed      Past Medical History:  Diagnosis Date  . Allergy   . Anemia   . Arthritis   . Coronary artery disease   . Diabetes mellitus    Type 2  . Diverticulitis   . ED (erectile dysfunction)   . Elevated homocysteine ([place])   . ESRD (end stage renal disease) on dialysis ([place]) 03/2015  . GERD (gastroesophageal reflux disease)    pepto   . Gout   . Hyperlipidemia   . Hypertension   . Hypothyroidism   . Pneumonia   . PVD (peripheral vascular disease) ([place])    has plastic aorta  . Renal insufficiency   . Seasonal allergies   . Shortness of breath dyspnea   . Sleep apnea   . Thyroid disease     Past Surgical History:  Procedure Laterality Date  . aortobifemoral bypass    . AV FISTULA PLACEMENT Left 12/01/2013   Procedure: ARTERIOVENOUS (AV) FISTULA CREATION- LEFT BRACHIOCEPHALIC;  Surgeon: Angelia Mould, MD;  Location: [facility];  Service: Vascular;  Laterality: Left;  . [place] TRANSPOSITION Right 07/27/2014   Procedure: BASCILIC VEIN TRANSPOSITION;  Surgeon: Angelia Mould, MD;  Location: [facility];   Service: Vascular;  Laterality: Right;  . BREAST SURGERY     left - granulomatous mastitis  . COLONOSCOPY    . 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 EYE SURGERY Bilateral    cataracts  . REVISON OF ARTERIOVENOUS FISTULA Left 02/09/2014   Procedure: REVISON OF LEFT ARTERIOVENOUS FISTULA - RESECTION OF RENDUNDANT VEIN;  Surgeon: Angelia Mould, MD;  Location: [facility];  Service: Vascular;  Laterality: Left;  . SBO with lysis adhesions    . SHUNTOGRAM Left 04/19/2014   Procedure: FISTULOGRAM;  Surgeon: Angelia Mould, MD;  Location: [facility] CATH LAB;  Service: Cardiovascular;  Laterality: Left;  . UNILATERAL UPPER EXTREMEITY ANGIOGRAM N/A 07/12/2014   Procedure: UNILATERAL UPPER Anselmo Rod;  Surgeon: Angelia Mould, MD;  Location: [facility] C[facility] LAB;  Service: Cardiovascular;  Laterality: N/A;    Vitals:   07/31/16 0828 07/31/16 0829  BP: (!) 143/71 127/70  Pulse: 72 84        Subjective Assessment - 07/31/16 0809    Subjective "Dialysis was rough yesterday. No energy." Pt reports he has been performing HEP "some".   Pertinent History * BP on L forearm only.   On dialysis M, W, F, peripheral artery disease, myalgia, diabetes, chronic kidney disease   Patient Stated Goals Pt's goal for therapy would be to get strength in arms and legs.  Currently in Pain? No/denies                         [facility] Adult PT Treatment/Exercise - 07/31/16 0001      Ambulation/Gait   Ambulation/Gait Yes   Ambulation/Gait Assistance 6: Modified independent (Device/Increase time);5: Supervision   Ambulation/Gait Assistance Details cueing for increased B hip/knee flexion during LE advancement of respective LE, for consistent B heel strike   Ambulation Distance (Feet) 230 Feet   Assistive device None   Gait Pattern Step-through pattern;Decreased arm swing - left;Decreased stride length;Decreased hip/knee flexion - right;Decreased hip/knee flexion - left;Lateral  trunk lean to right;Lateral trunk lean to left;Trunk flexed   Ambulation Surface Level;Indoor     Exercises   Exercises Other Exercises   Other Exercises  Supine bridging x10 reps without Bosu, x10 reps with Bosu. Supine SLR x8 reps per side (to pt fatigue) with cueing for motor control of quadriceps during eccentric lowering; supin, hook lying with LE extended off EOM, pt performed B Thomas stretch x45-sec holds for hip flexor extensibility.              Balance Exercises - 07/31/16 1525      Balance Exercises: Standing   Gait with Head Turns Retro;4 reps;Other (comment)  2 x30' while searching for cards in hallway   Other Standing Exercises LE brading 3 x20' per direction without UE support with cueing for sequencing, min guard for stability/balance;  standing without UE support, pt used each LE to turn over cones, place cones in upright position 2 x5 per LE with min guard, tactile cueing for weight shift to L during dynamic L SLS.           PT Education - 07/31/16 1529    Education provided Yes   Education Details Reiterated importance of daily HEP compliance to maximize functional gains made in PT. This PT offered to decrease # of exercises to increase pt compliance, but pt politely declined.   Person(s) Educated Patient   Methods Explanation   Comprehension Verbalized understanding             PT Long Term Goals - 07/12/16 1934      PT LONG TERM GOAL #1   Title Pt will be independent with HEP for imrpoved balance, strength and gait.  TARGET 08/11/16   Time 4   Period Weeks   Status New     PT LONG TERM GOAL #2   Title Pt will improve TUG score to less than or equal to 13.5 seconds for decreased fall risk.   Time 4   Period Weeks   Status New     PT LONG TERM GOAL #3   Title Pt will improve Dynamic Gait Index score to at least 20/24 for decreased fall risk.   Time 4   Period Weeks   Status New     PT LONG TERM GOAL #4   Title Pt will improve 5x  sit<>stand transfers to less than or equal to 16 seconds for improved efficiency and safety with transfers.   Time 4   Period Weeks   Status New     PT LONG TERM GOAL #5   Title Pt will ambulate at least 1000 ft indoor and outdoor surfaces, independently/no LOB for improved outdoor and community gait.   Time 4   Period Weeks   Status New               Plan -  07/31/16 1530    Clinical Impression Statement Session focused on continued standing balance, dynamic gait training, and LE strengthening. Pt continues to require frequent seated rest breaks due to SOB. Vital signs stable througout this session.    Rehab Potential Good   PT Frequency 2x / week   PT Duration 4 weeks   PT Treatment/Interventions ADLs/Self Care Home Management;Functional mobility training;Gait training;Therapeutic activities;Therapeutic exercise;Balance training;Neuromuscular re-education;Patient/family education   PT Next Visit Plan Continue functional gait and balance activities, BLE strengthening, remind pt to breathe   PT Home Exercise Plan SLS and marching at countertop, sit<>stands, standing hip abd    Consulted and Agree with Plan of Care Patient      Patient will benefit from skilled therapeutic intervention in order to improve the following deficits and impairments:  Abnormal gait, Decreased balance, Decreased strength, Decreased mobility, Difficulty walking  Visit Diagnosis: Other abnormalities of gait and mobility  Unsteadiness on feet  Muscle weakness (generalized)     Problem List Patient Active Problem List   Diagnosis Date Noted  . Dyspnea   . Macrocytic anemia 06/04/2016  . Thrombocytopenia ([place]) 06/04/2016  . Exertional dyspnea 06/04/2016  . Arm paresthesia, left 06/04/2016  . Dyspnea on exertion 06/04/2016  . Tenderness of right calf 06/04/2016  . Mild cognitive impairment with memory loss 01/12/2016  . ESRD on dialysis ([place]) 05/17/2015  . Hypoglycemia   . Weakness  02/18/2015  . OSA (obstructive sleep apnea) 02/02/2015  . Hyperkalemia 10/05/2014  . Chronic kidney disease (CKD), stage IV (severe) ([place]) 04/07/2014  . End stage renal disease ([place]) 11/18/2013  . BPH (benign prostatic hyperplasia) 11/22/2012  . Peripheral vascular disease ([place]) 12/24/2011  . Hyperparathyroidism 12/24/2011  . Hypothyroidism 12/24/2011  . Allergic rhinitis 12/24/2011  . Erectile dysfunction 12/24/2011  . Insulin dependent diabetes mellitus ([place]) 09/03/2008  . HYPERLIPIDEMIA 09/03/2008  . Essential hypertension 08/30/2008  . PANCREATITIS, HX OF 08/30/2008  . RENAL FAILURE, ACUTE, HX OF 08/30/2008  . DIVERTICULOSIS, COLON 06/08/2003    Billie Ruddy, PT, DPT [facility] [address] [place], [place], [address] Phone: [phone]   Fax:  [phone] 07/31/16, 3:33 PM  Name: WILMONT OLUND MRN: [id number] Date of Birth: 06/02/1941

## 2016-08-01 DIAGNOSIS — D631 Anemia in chronic kidney disease: Secondary | ICD-10-CM | POA: Diagnosis not present

## 2016-08-01 DIAGNOSIS — E1129 Type 2 diabetes mellitus with other diabetic kidney complication: Secondary | ICD-10-CM | POA: Diagnosis not present

## 2016-08-01 DIAGNOSIS — N2581 Secondary hyperparathyroidism of renal origin: Secondary | ICD-10-CM | POA: Diagnosis not present

## 2016-08-01 DIAGNOSIS — N186 End stage renal disease: Secondary | ICD-10-CM | POA: Diagnosis not present

## 2016-08-03 ENCOUNTER — Ambulatory Visit: Payer: Medicare Other | Admitting: Physical Therapy

## 2016-08-03 DIAGNOSIS — E1129 Type 2 diabetes mellitus with other diabetic kidney complication: Secondary | ICD-10-CM | POA: Diagnosis not present

## 2016-08-03 DIAGNOSIS — D631 Anemia in chronic kidney disease: Secondary | ICD-10-CM | POA: Diagnosis not present

## 2016-08-03 DIAGNOSIS — N186 End stage renal disease: Secondary | ICD-10-CM | POA: Diagnosis not present

## 2016-08-03 DIAGNOSIS — R2689 Other abnormalities of gait and mobility: Secondary | ICD-10-CM

## 2016-08-03 DIAGNOSIS — M6281 Muscle weakness (generalized): Secondary | ICD-10-CM | POA: Diagnosis not present

## 2016-08-03 DIAGNOSIS — R2681 Unsteadiness on feet: Secondary | ICD-10-CM | POA: Diagnosis not present

## 2016-08-03 DIAGNOSIS — N2581 Secondary hyperparathyroidism of renal origin: Secondary | ICD-10-CM | POA: Diagnosis not present

## 2016-08-03 NOTE — Patient Instructions (Signed)
Walking Program:  Continue walking for exercise for 7 minutes, 1-2 times/day, 5 days/week.   Progress your walking program by adding 2 minutes to your routine each week, as tolerated. Be sure to wear good walking shoes, walk in a safe environment and only progress to your tolerance.     Tandem Walking    Walk along the length of your countertop with each foot directly in front of other, heel of one foot touching toes of other foot with each step. Hold each position for 2 seconds. Keep your hand close to the counter, using it for balance only as needed. Both feet straight ahead. Perform 10 reps, __1-2__ times per day.  SINGLE LIMB STANCE    Do this exercise facing your countertop with hands close to counter, using for balance as needed. Stance: single leg on floor. Raise leg. Attempt to hold for  __5_ seconds. Repeat with other leg. _10__ reps per set, _1-2__ sets per day on each leg.   Feet Together (Compliant Surface) Head Motion - Eyes Closed    Stand with your back to a corner with a stable chair in front of you. Stand on 2 stacked pillows or 1 couch cushion with feet together. Close eyes and move head slowly: up and down 10 times; right to left 10 times. Repeat __1-2__ times per day.  Copyright  VHI. All rights reserved.

## 2016-08-03 NOTE — Therapy (Signed)
[facility] [address] [place], [place], [address] Phone: [phone]   Fax:  [phone]  Physical Therapy Treatment  Patient Details  Name: [name] MRN: [id number] Date of Birth: 01/05/1941 Referring Provider: Oren Binet, MD (hospital) (family doctor is Tedra Senegal)  Encounter Date: 08/03/2016      PT End of Session - 08/03/16 1907    Visit Number 5   Number of Visits 9   Date for PT Re-Evaluation 09/09/16   Authorization Type Medicare primary/Tricare 2nd (NO PTAs); GCODE every 10th visit   PT Start Time 0847   PT Stop Time 0930   PT Time Calculation (min) 43 min   Activity Tolerance Patient tolerated treatment well;Patient limited by fatigue;Other (comment)  Continues to require frequent seated rest breaks.   Behavior During Therapy [facility] for tasks assessed/performed      Past Medical History:  Diagnosis Date  . Allergy   . Anemia   . Arthritis   . Coronary artery disease   . Diabetes mellitus    Type 2  . Diverticulitis   . ED (erectile dysfunction)   . Elevated homocysteine ([place])   . ESRD (end stage renal disease) on dialysis ([place]) 03/2015  . GERD (gastroesophageal reflux disease)    pepto   . Gout   . Hyperlipidemia   . Hypertension   . Hypothyroidism   . Pneumonia   . PVD (peripheral vascular disease) ([place])    has plastic aorta  . Renal insufficiency   . Seasonal allergies   . Shortness of breath dyspnea   . Sleep apnea   . Thyroid disease     Past Surgical History:  Procedure Laterality Date  . aortobifemoral bypass    . AV FISTULA PLACEMENT Left 12/01/2013   Procedure: ARTERIOVENOUS (AV) FISTULA CREATION- LEFT BRACHIOCEPHALIC;  Surgeon: Angelia Mould, MD;  Location: [facility];  Service: Vascular;  Laterality: Left;  . [place] TRANSPOSITION Right 07/27/2014   Procedure: BASCILIC VEIN TRANSPOSITION;  Surgeon: Angelia Mould, MD;  Location: [facility];  Service:  Vascular;  Laterality: Right;  . BREAST SURGERY     left - granulomatous mastitis  . COLONOSCOPY    . 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 EYE SURGERY Bilateral    cataracts  . REVISON OF ARTERIOVENOUS FISTULA Left 02/09/2014   Procedure: REVISON OF LEFT ARTERIOVENOUS FISTULA - RESECTION OF RENDUNDANT VEIN;  Surgeon: Angelia Mould, MD;  Location: [facility];  Service: Vascular;  Laterality: Left;  . SBO with lysis adhesions    . SHUNTOGRAM Left 04/19/2014   Procedure: FISTULOGRAM;  Surgeon: Angelia Mould, MD;  Location: [facility] CATH LAB;  Service: Cardiovascular;  Laterality: Left;  . UNILATERAL UPPER EXTREMEITY ANGIOGRAM N/A 07/12/2014   Procedure: UNILATERAL UPPER Anselmo Rod;  Surgeon: Angelia Mould, MD;  Location: [facility] CATH LAB;  Service: Cardiovascular;  Laterality: N/A;    There were no vitals filed for this visit.      Subjective Assessment - 08/03/16 0850    Subjective Pt reports no falls and no significant changes since last session.    Pertinent History * BP on L forearm only.   On dialysis M, W, F, peripheral artery disease, myalgia, diabetes, chronic kidney disease   Patient Stated Goals Pt's goal for therapy would be to get strength in arms and legs.     Currently in Pain? No/denies  [facility] PT Assessment - 08/03/16 0001      Transfers   Five time sit to stand comments  15.57      Standardized Balance Assessment   Standardized Balance Assessment Dynamic Gait Index;Timed Up and Go Test     Dynamic Gait Index   Level Surface Normal   Change in Gait Speed Normal   Gait with Horizontal Head Turns Mild Impairment   Gait with Vertical Head Turns Normal   Gait and Pivot Turn Normal   Step Over Obstacle Normal   Step Around Obstacles Normal   Steps Mild Impairment   Total Score 22   DGI comment: >19/24 indicates decreased fall risk.     Timed Up and Go Test   Normal TUG (seconds) 10.55                      [facility] Adult PT Treatment/Exercise - 08/03/16 0001      Transfers   Transfers Sit to Stand;Stand to Sit   Sit to Stand 6: Modified independent (Device/Increase time)   Stand to Sit 6: Modified independent (Device/Increase time)     Ambulation/Gait   Ambulation/Gait Yes   Ambulation/Gait Assistance 6: Modified independent (Device/Increase time)   Ambulation Distance (Feet) 1100 Feet   Assistive device None   Gait Pattern Step-through pattern;Decreased arm swing - left;Decreased stride length;Decreased hip/knee flexion - right;Decreased hip/knee flexion - left;Lateral trunk lean to right;Lateral trunk lean to left;Trunk flexed   Ambulation Surface Unlevel;Outdoor;Paved             Balance Exercises - 08/03/16 1905      Balance Exercises: Standing   Standing Eyes Closed Narrow base of support (BOS);Head turns;Foam/compliant surface;Other reps (comment)  2 pillows; horiz, vertical turns 2 x5 each   SLS Eyes open;Solid surface;Intermittent upper extremity support;Other reps (comment)  5 attempts per side; able to hold for up to 3 seconds bilat   Tandem Gait Forward;Intermittent upper extremity support;Other (comment)  4 x10' with cueing for safety, technique           PT Education - 08/03/16 1904    Education provided Yes   Education Details HEP modified to focus on standing balance. Educated pt on STG's, progress, functional implications.   Person(s) Educated Patient   Methods Explanation;Demonstration;Verbal cues;Handout   Comprehension Verbalized understanding;Need further instruction             PT Long Term Goals - 08/03/16 0905      PT LONG TERM GOAL #1   Title Pt will be independent with HEP for imrpoved balance, strength and gait.  TARGET 08/11/16   Time 4   Period Weeks   Status On-going     PT LONG TERM GOAL #2   Title Pt will improve TUG score to less than or equal to 13.5 seconds for decreased fall risk.   Baseline 11.10: TUG = 10.55 seconds    Time 4   Period Weeks   Status Achieved     PT LONG TERM GOAL #3   Title Pt will improve Dynamic Gait Index score to at least 20/24 for decreased fall risk.   Baseline 11/10: DGI = 22/24   Time 4   Period Weeks   Status Achieved     PT LONG TERM GOAL #4   Title Pt will improve 5x sit<>stand transfers to less than or equal to 16 seconds for improved efficiency and safety with transfers.   Baseline 11/10: 5x STS Test = 15.57  seconds   Time 4   Period Weeks   Status Achieved     PT LONG TERM GOAL #5   Title Pt will ambulate at least 1000 ft indoor and outdoor surfaces, independently/no LOB for improved outdoor and community gait.   Baseline Met 11/10.   Time 4   Period Weeks   Status Achieved               Plan - 08/03/16 1908    Clinical Impression Statement Session focused on assessing STG's, as patient plans to take 1-week break from PT for vacation. Pt met 4 of 4 assessed STG's, suggesting improved dynamic gait stability, decreased fall risk, increased functional LE strength, and increased independence with community mobility. Modified HEP to emphasize standing balance and dynamic gait.   Rehab Potential Good   PT Frequency 2x / week   PT Duration 4 weeks   PT Treatment/Interventions ADLs/Self Care Home Management;Functional mobility training;Gait training;Therapeutic activities;Therapeutic exercise;Balance training;Neuromuscular re-education;Patient/family education   PT Next Visit Plan Assess remaining STG for HEP compliance. Continue functional gait and balance activities, BLE strengthening, remind pt to breathe   PT Home Exercise Plan See Pt Instructions from 11/10 visit.   Consulted and Agree with Plan of Care Patient      Patient will benefit from skilled therapeutic intervention in order to improve the following deficits and impairments:  Abnormal gait, Decreased balance, Decreased strength, Decreased mobility, Difficulty walking  Visit Diagnosis: Other  abnormalities of gait and mobility  Unsteadiness on feet  Muscle weakness (generalized)     Problem List Patient Active Problem List   Diagnosis Date Noted  . Dyspnea   . Macrocytic anemia 06/04/2016  . Thrombocytopenia ([place]) 06/04/2016  . Exertional dyspnea 06/04/2016  . Arm paresthesia, left 06/04/2016  . Dyspnea on exertion 06/04/2016  . Tenderness of right calf 06/04/2016  . Mild cognitive impairment with memory loss 01/12/2016  . ESRD on dialysis ([place]) 05/17/2015  . Hypoglycemia   . Weakness 02/18/2015  . OSA (obstructive sleep apnea) 02/02/2015  . Hyperkalemia 10/05/2014  . Chronic kidney disease (CKD), stage IV (severe) ([place]) 04/07/2014  . End stage renal disease ([place]) 11/18/2013  . BPH (benign prostatic hyperplasia) 11/22/2012  . Peripheral vascular disease ([place]) 12/24/2011  . Hyperparathyroidism 12/24/2011  . Hypothyroidism 12/24/2011  . Allergic rhinitis 12/24/2011  . Erectile dysfunction 12/24/2011  . Insulin dependent diabetes mellitus ([place]) 09/03/2008  . HYPERLIPIDEMIA 09/03/2008  . Essential hypertension 08/30/2008  . PANCREATITIS, HX OF 08/30/2008  . RENAL FAILURE, ACUTE, HX OF 08/30/2008  . DIVERTICULOSIS, COLON 06/08/2003    Billie Ruddy, PT, DPT [facility] [address] [place], [place], [address] Phone: [phone]   Fax:  [phone] 08/03/16, 7:11 PM  Name: [name] MRN: [id number] Date of Birth: Jul 12, 1941

## 2016-08-06 ENCOUNTER — Ambulatory Visit: Payer: Medicare Other | Admitting: Physical Therapy

## 2016-08-06 DIAGNOSIS — D509 Iron deficiency anemia, unspecified: Secondary | ICD-10-CM | POA: Diagnosis not present

## 2016-08-06 DIAGNOSIS — Z992 Dependence on renal dialysis: Secondary | ICD-10-CM | POA: Diagnosis not present

## 2016-08-06 DIAGNOSIS — N186 End stage renal disease: Secondary | ICD-10-CM | POA: Diagnosis not present

## 2016-08-08 DIAGNOSIS — D509 Iron deficiency anemia, unspecified: Secondary | ICD-10-CM | POA: Diagnosis not present

## 2016-08-08 DIAGNOSIS — N186 End stage renal disease: Secondary | ICD-10-CM | POA: Diagnosis not present

## 2016-08-08 DIAGNOSIS — Z992 Dependence on renal dialysis: Secondary | ICD-10-CM | POA: Diagnosis not present

## 2016-08-09 ENCOUNTER — Ambulatory Visit: Payer: Medicare Other | Admitting: Physical Therapy

## 2016-08-10 DIAGNOSIS — D509 Iron deficiency anemia, unspecified: Secondary | ICD-10-CM | POA: Diagnosis not present

## 2016-08-10 DIAGNOSIS — Z992 Dependence on renal dialysis: Secondary | ICD-10-CM | POA: Diagnosis not present

## 2016-08-10 DIAGNOSIS — N186 End stage renal disease: Secondary | ICD-10-CM | POA: Diagnosis not present

## 2016-08-10 NOTE — Addendum Note (Signed)
Addended by: Lianne Cure A on: 08/10/2016 03:46 PM   Modules accepted: Orders

## 2016-08-14 ENCOUNTER — Ambulatory Visit: Payer: Medicare Other | Admitting: Physical Therapy

## 2016-08-14 DIAGNOSIS — L218 Other seborrheic dermatitis: Secondary | ICD-10-CM | POA: Diagnosis not present

## 2016-08-14 DIAGNOSIS — D631 Anemia in chronic kidney disease: Secondary | ICD-10-CM | POA: Diagnosis not present

## 2016-08-14 DIAGNOSIS — E1129 Type 2 diabetes mellitus with other diabetic kidney complication: Secondary | ICD-10-CM | POA: Diagnosis not present

## 2016-08-14 DIAGNOSIS — N186 End stage renal disease: Secondary | ICD-10-CM | POA: Diagnosis not present

## 2016-08-14 DIAGNOSIS — N2581 Secondary hyperparathyroidism of renal origin: Secondary | ICD-10-CM | POA: Diagnosis not present

## 2016-08-15 ENCOUNTER — Ambulatory Visit: Payer: Medicare Other | Admitting: Physical Therapy

## 2016-08-15 DIAGNOSIS — R2689 Other abnormalities of gait and mobility: Secondary | ICD-10-CM

## 2016-08-15 DIAGNOSIS — R2681 Unsteadiness on feet: Secondary | ICD-10-CM

## 2016-08-15 DIAGNOSIS — M6281 Muscle weakness (generalized): Secondary | ICD-10-CM | POA: Diagnosis not present

## 2016-08-15 NOTE — Patient Instructions (Signed)
Walking Program:  Continue walking for exercise for 49minutes, 1-2times/day, 5days/week.  Progress your walkingprogramby adding 43minutes to your routine each week, as tolerated. Be sure to wear good walking shoes, walk in a safe environment and only progress to your tolerance.   Tandem Walking    Walk along the length of your countertop with each foot directly in front of other, heel of one foot touching toes of other foot with each step. Hold each position for 2 seconds. Keep your hand close to the counter, using it for balance only as needed. Both feet straight ahead. Perform 10 reps, __1-2__ times per day.  SINGLE LIMB STANCE    Do this exercise facing your countertop with hands close to counter, using for balance as needed. Stance: single leg on floor. Raise leg. Attempt to hold for  __5_ seconds. Repeat with other leg. _10__ reps per set, _1-2__ sets per day on each leg.   Feet Together (Compliant Surface) Head Motion - Eyes Closed    Stand with your back to a corner with a stable chair in front of you. Stand on 2 stacked pillows or 1 couch cushion with feet together. Close eyes and move head slowly: up and down 10 times; right to left 10 times. Repeat __1-2__ times per day.

## 2016-08-15 NOTE — Therapy (Signed)
[facility] [address] [place], [place], [address] Phone: [phone]   Fax:  [phone]  Physical Therapy Treatment  Patient Details  Name: [name] MRN: [id number] Date of Birth: [birth year] Referring Provider: Oren Binet, MD (hospital) (family doctor is Tedra Senegal)  Encounter Date: 08/15/2016      PT End of Session - 08/15/16 1619    Visit Number 6   Number of Visits 9   Date for PT Re-Evaluation 09/09/16   Authorization Type Medicare primary/Tricare 2nd (NO PTAs); GCODE every 10th visit   PT Start Time 1546  Pt arrived late to session.   PT Stop Time 1615   PT Time Calculation (min) 29 min   Activity Tolerance Patient tolerated treatment well;Patient limited by fatigue;Other (comment)  Continues to require frequent seated rest breaks.   Behavior During Therapy [facility] for tasks assessed/performed      Past Medical History:  Diagnosis Date  . Allergy   . Anemia   . Arthritis   . Coronary artery disease   . Diabetes mellitus    Type 2  . Diverticulitis   . ED (erectile dysfunction)   . Elevated homocysteine ([place])   . ESRD (end stage renal disease) on dialysis ([place]) 03/2015  . GERD (gastroesophageal reflux disease)    pepto   . Gout   . Hyperlipidemia   . Hypertension   . Hypothyroidism   . Pneumonia   . PVD (peripheral vascular disease) ([place])    has plastic aorta  . Renal insufficiency   . Seasonal allergies   . Shortness of breath dyspnea   . Sleep apnea   . Thyroid disease     Past Surgical History:  Procedure Laterality Date  . aortobifemoral bypass    . AV FISTULA PLACEMENT Left 12/01/2013   Procedure: ARTERIOVENOUS (AV) FISTULA CREATION- LEFT BRACHIOCEPHALIC;  Surgeon: Angelia Mould, MD;  Location: [place];  Service: Vascular;  Laterality: Left;  . [place] TRANSPOSITION Right 07/27/2014   Procedure: BASCILIC VEIN TRANSPOSITION;  Surgeon: Angelia Mould, MD;   Location: [place];  Service: Vascular;  Laterality: Right;  . BREAST SURGERY     left - granulomatous mastitis  . COLONOSCOPY    . 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 EYE SURGERY Bilateral    cataracts  . REVISON OF ARTERIOVENOUS FISTULA Left 02/09/2014   Procedure: REVISON OF LEFT ARTERIOVENOUS FISTULA - RESECTION OF RENDUNDANT VEIN;  Surgeon: Angelia Mould, MD;  Location: [place];  Service: Vascular;  Laterality: Left;  . SBO with lysis adhesions    . SHUNTOGRAM Left 04/19/2014   Procedure: FISTULOGRAM;  Surgeon: Angelia Mould, MD;  Location: [facility] CATH LAB;  Service: Cardiovascular;  Laterality: Left;  . UNILATERAL UPPER EXTREMEITY ANGIOGRAM N/A 07/12/2014   Procedure: UNILATERAL UPPER Anselmo Rod;  Surgeon: Angelia Mould, MD;  Location: [facility] CATH LAB;  Service: Cardiovascular;  Laterality: N/A;    There were no vitals filed for this visit.      Subjective Assessment - 08/15/16 1550    Subjective "I thought my appointment was at 3:45." When asked about HEP compliance, pt reports he did perform walking program but did not consistently perform balance HEP.   Pertinent History * BP on L forearm only.   On dialysis M, W, F, peripheral artery disease, myalgia, diabetes, chronic kidney disease   Patient Stated Goals Pt's goal for therapy would be to get strength in  arms and legs.     Currently in Pain? No/denies                         [facility] Adult PT Treatment/Exercise - 08/15/16 0001      Exercises   Exercises Other Exercises   Other Exercises  Step-ups on 6" step without UE support: forward x15 reps per side, lateral x15 reps per side.             Balance Exercises - 08/15/16 1618      Balance Exercises: Standing   Standing Eyes Closed Narrow base of support (BOS);Head turns;Foam/compliant surface;Other reps (comment)  2 pillows; horiz, vertical turns x10 each   SLS Eyes open;Solid surface;Intermittent upper extremity  support;4 reps;Other reps (comment)  >5 seconds on R; < 5 seconds on L   Tandem Gait Forward;Intermittent upper extremity support;2 reps  2 x10'           PT Education - 08/15/16 1600    Education provided Yes   Education Details Reviewed HEP; see Pt Instructions for details. Strongly recommended pt consistently perform HEP. Recommended pt discontinue this episode of PT if unable to consistently perform HEP.  Provided exercise tracking sheet to increase pt compliance.   Person(s) Educated Patient   Methods Explanation;Demonstration;Handout;Verbal cues   Comprehension Verbalized understanding;Returned demonstration             PT Long Term Goals - 08/03/16 0905      PT LONG TERM GOAL #1   Title Pt will be independent with HEP for imrpoved balance, strength and gait.  TARGET 08/11/16   Time 4   Period Weeks   Status On-going     PT LONG TERM GOAL #2   Title Pt will improve TUG score to less than or equal to 13.5 seconds for decreased fall risk.   Baseline 11.10: TUG = 10.55 seconds   Time 4   Period Weeks   Status Achieved     PT LONG TERM GOAL #3   Title Pt will improve Dynamic Gait Index score to at least 20/24 for decreased fall risk.   Baseline 11/10: DGI = 22/24   Time 4   Period Weeks   Status Achieved     PT LONG TERM GOAL #4   Title Pt will improve 5x sit<>stand transfers to less than or equal to 16 seconds for improved efficiency and safety with transfers.   Baseline 11/10: 5x STS Test = 15.57 seconds   Time 4   Period Weeks   Status Achieved     PT LONG TERM GOAL #5   Title Pt will ambulate at least 1000 ft indoor and outdoor surfaces, independently/no LOB for improved outdoor and community gait.   Baseline Met 11/10.   Time 4   Period Weeks   Status Achieved               Plan - 08/15/16 1620    Clinical Impression Statement Current session focused on reviewing HEP, as this session was the first since 11/10 due to pt having been out of  town. Pt has consistently been performing walking program, but has not been doing balance HEP. PT strongly recommended pt perform HEP daily and asked pt to consider discontinuing PT if unable to consistently perform PT recommendations. Pt in full agreement.    Rehab Potential Good   PT Frequency 2x / week   PT Duration 4 weeks   PT Treatment/Interventions ADLs/Self  Care Home Management;Functional mobility training;Gait training;Therapeutic activities;Therapeutic exercise;Balance training;Neuromuscular re-education;Patient/family education   PT Next Visit Plan Continue functional gait and balance activities, BLE strengthening, remind pt to breathe.   PT Home Exercise Plan See Pt Instructions from 11/10 visit.   Consulted and Agree with Plan of Care Patient      Patient will benefit from skilled therapeutic intervention in order to improve the following deficits and impairments:  Abnormal gait, Decreased balance, Decreased strength, Decreased mobility, Difficulty walking  Visit Diagnosis: Other abnormalities of gait and mobility  Unsteadiness on feet  Muscle weakness (generalized)     Problem List Patient Active Problem List   Diagnosis Date Noted  . Dyspnea   . Macrocytic anemia 06/04/2016  . Thrombocytopenia ([place]) 06/04/2016  . Exertional dyspnea 06/04/2016  . Arm paresthesia, left 06/04/2016  . Dyspnea on exertion 06/04/2016  . Tenderness of right calf 06/04/2016  . Mild cognitive impairment with memory loss 01/12/2016  . ESRD on dialysis ([place]) 05/17/2015  . Hypoglycemia   . Weakness 02/18/2015  . OSA (obstructive sleep apnea) 02/02/2015  . Hyperkalemia 10/05/2014  . Chronic kidney disease (CKD), stage IV (severe) ([place]) 04/07/2014  . End stage renal disease ([place]) 11/18/2013  . BPH (benign prostatic hyperplasia) 11/22/2012  . Peripheral vascular disease ([place]) 12/24/2011  . Hyperparathyroidism 12/24/2011  . Hypothyroidism 12/24/2011  . Allergic rhinitis 12/24/2011  .  Erectile dysfunction 12/24/2011  . Insulin dependent diabetes mellitus ([place]) 09/03/2008  . HYPERLIPIDEMIA 09/03/2008  . Essential hypertension 08/30/2008  . PANCREATITIS, HX OF 08/30/2008  . RENAL FAILURE, ACUTE, HX OF 08/30/2008  . DIVERTICULOSIS, COLON 06/08/2003   Billie Ruddy, PT, DPT [facility] [address] [place], [place], [address] Phone: [phone]   Fax:  [phone] 08/15/16, 4:31 PM  Name: [name] MRN: [id number] Date of Birth: [birth year]

## 2016-08-17 DIAGNOSIS — E1129 Type 2 diabetes mellitus with other diabetic kidney complication: Secondary | ICD-10-CM | POA: Diagnosis not present

## 2016-08-17 DIAGNOSIS — N186 End stage renal disease: Secondary | ICD-10-CM | POA: Diagnosis not present

## 2016-08-17 DIAGNOSIS — D631 Anemia in chronic kidney disease: Secondary | ICD-10-CM | POA: Diagnosis not present

## 2016-08-17 DIAGNOSIS — N2581 Secondary hyperparathyroidism of renal origin: Secondary | ICD-10-CM | POA: Diagnosis not present

## 2016-08-20 ENCOUNTER — Ambulatory Visit: Payer: Medicare Other | Admitting: *Deleted

## 2016-08-20 DIAGNOSIS — D631 Anemia in chronic kidney disease: Secondary | ICD-10-CM | POA: Diagnosis not present

## 2016-08-20 DIAGNOSIS — R2689 Other abnormalities of gait and mobility: Secondary | ICD-10-CM | POA: Diagnosis not present

## 2016-08-20 DIAGNOSIS — R2681 Unsteadiness on feet: Secondary | ICD-10-CM | POA: Diagnosis not present

## 2016-08-20 DIAGNOSIS — N2581 Secondary hyperparathyroidism of renal origin: Secondary | ICD-10-CM | POA: Diagnosis not present

## 2016-08-20 DIAGNOSIS — M6281 Muscle weakness (generalized): Secondary | ICD-10-CM

## 2016-08-20 DIAGNOSIS — N186 End stage renal disease: Secondary | ICD-10-CM | POA: Diagnosis not present

## 2016-08-20 DIAGNOSIS — E1129 Type 2 diabetes mellitus with other diabetic kidney complication: Secondary | ICD-10-CM | POA: Diagnosis not present

## 2016-08-20 NOTE — Patient Instructions (Signed)
Educated in plan for d/c next session and to work with HEP at home.  Also for plan to give resources for community fitness at next session.

## 2016-08-20 NOTE — Therapy (Signed)
[facility] [address] [place], [place], [address] Phone: [phone]   Fax:  [phone]  Physical Therapy Treatment  Patient Details  Name: [name] MRN: [id number] Date of Birth: 07-22-1941 Referring Provider: Oren Binet, MD (hospital) (family doctor is Tedra [place])  Encounter Date: 08/20/2016      PT End of Session - 08/20/16 1037    Visit Number 7   Number of Visits 9   Date for PT Re-Evaluation 09/09/16   Authorization Type Medicare primary/Tricare 2nd (NO PTAs); GCODE every 10th visit   PT Start Time 0845   PT Stop Time 0929   PT Time Calculation (min) 44 min   Activity Tolerance Patient tolerated treatment well   Behavior During Therapy [facility] for tasks assessed/performed      Past Medical History:  Diagnosis Date  . Allergy   . Anemia   . Arthritis   . Coronary artery disease   . Diabetes mellitus    Type 2  . Diverticulitis   . ED (erectile dysfunction)   . Elevated homocysteine ([place])   . ESRD (end stage renal disease) on dialysis ([place]) 03/2015  . GERD (gastroesophageal reflux disease)    pepto   . Gout   . Hyperlipidemia   . Hypertension   . Hypothyroidism   . Pneumonia   . PVD (peripheral vascular disease) ([place])    has plastic aorta  . Renal insufficiency   . Seasonal allergies   . Shortness of breath dyspnea   . Sleep apnea   . Thyroid disease     Past Surgical History:  Procedure Laterality Date  . aortobifemoral bypass    . AV FISTULA PLACEMENT Left 12/01/2013   Procedure: ARTERIOVENOUS (AV) FISTULA CREATION- LEFT BRACHIOCEPHALIC;  Surgeon: Angelia Mould, MD;  Location: [facility];  Service: Vascular;  Laterality: Left;  . [place] TRANSPOSITION Right 07/27/2014   Procedure: BASCILIC VEIN TRANSPOSITION;  Surgeon: Angelia Mould, MD;  Location: [facility];  Service: Vascular;  Laterality: Right;  . BREAST SURGERY     left - granulomatous mastitis  .  COLONOSCOPY    . 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 EYE SURGERY Bilateral    cataracts  . REVISON OF ARTERIOVENOUS FISTULA Left 02/09/2014   Procedure: REVISON OF LEFT ARTERIOVENOUS FISTULA - RESECTION OF RENDUNDANT VEIN;  Surgeon: Angelia Mould, MD;  Location: [place] [facility];  Service: Vascular;  Laterality: Left;  . SBO with lysis adhesions    . SHUNTOGRAM Left 04/19/2014   Procedure: FISTULOGRAM;  Surgeon: Angelia Mould, MD;  Location: [facility] CATH LAB;  Service: Cardiovascular;  Laterality: Left;  . UNILATERAL UPPER EXTREMEITY ANGIOGRAM N/A 07/12/2014   Procedure: UNILATERAL UPPER Anselmo Rod;  Surgeon: Angelia Mould, MD;  Location: [facility] CATH LAB;  Service: Cardiovascular;  Laterality: N/A;    There were no vitals filed for this visit.      Subjective Assessment - 08/20/16 0850    Subjective legs a little sore from walking,  Working on walking exercises for balance.  Walking for about 6-7 minutes each day.  Trying to increase it.    Pertinent History * BP on L forearm only.   On dialysis M, W, F, peripheral artery disease, myalgia, diabetes, chronic kidney disease   Patient Stated Goals Pt's goal for therapy would be to get strength in arms and legs.     Pain Score 3    Pain Location Leg  Pain Orientation Left   Pain Descriptors / Indicators Sore   Pain Onset In the past 7 days   Aggravating Factors  walking   Pain Relieving Factors rest                         [facility] Adult PT Treatment/Exercise - 08/20/16 0001      Transfers   Transfers Sit to Stand   Sit to Stand 7: Independent;Without upper extremity assist   Stand to Sit 7: Independent;Without upper extremity assist   Number of Reps Other reps (comment)  6     Ambulation/Gait   Ambulation/Gait Yes   Ambulation/Gait Assistance 6: Modified independent (Device/Increase time)   Ambulation Distance (Feet) 600 Feet   Assistive device None   Gait Pattern Step-through  pattern;Decreased arm swing - left;Decreased stride length;Decreased hip/knee flexion - right;Decreased hip/knee flexion - left;Lateral trunk lean to right;Lateral trunk lean to left;Trunk flexed   Ambulation Surface Unlevel;Outdoor;Paved;Grass     Balance   Balance Assessed Yes     High Level Balance   High Level Balance Activities Tandem walking  marching in place trying to increased SLS (about 2-3 sec)   High Level Balance Comments balance on pillow feet together eyes closed with head turns x 10, head nods x 10 in corner with chair close for safety     Knee/Hip Exercises: Stretches   Hip Flexor Stretch Both;1 rep;20 seconds   Knee: Self-Stretch to increase Flexion Both;1 rep;20 seconds   Knee: Self-Stretch Limitations single knee to chest   Other Knee/Hip Stretches low trunk rotation x 10 reps hooklying.     Knee/Hip Exercises: Supine   Bridges Limitations bridging 10x 3 sec hold   Straight Leg Raises 10 reps;Strengthening;Both;Limitations   Straight Leg Raises Limitations cues for slowly lowering, performed with pauses to lower.                 PT Education - 08/20/16 1033    Education provided Yes   Education Details Plan   Person(s) Educated Patient   Methods Explanation;Demonstration   Comprehension Verbalized understanding             PT Long Term Goals - 08/03/16 0905      PT LONG TERM GOAL #1   Title Pt will be independent with HEP for imrpoved balance, strength and gait.  TARGET 08/11/16   Time 4   Period Weeks   Status On-going     PT LONG TERM GOAL #2   Title Pt will improve TUG score to less than or equal to 13.5 seconds for decreased fall risk.   Baseline 11.10: TUG = 10.55 seconds   Time 4   Period Weeks   Status Achieved     PT LONG TERM GOAL #3   Title Pt will improve Dynamic Gait Index score to at least 20/24 for decreased fall risk.   Baseline 11/10: DGI = 22/24   Time 4   Period Weeks   Status Achieved     PT LONG TERM GOAL #4    Title Pt will improve 5x sit<>stand transfers to less than or equal to 16 seconds for improved efficiency and safety with transfers.   Baseline 11/10: 5x STS Test = 15.57 seconds   Time 4   Period Weeks   Status Achieved     PT LONG TERM GOAL #5   Title Pt will ambulate at least 1000 ft indoor and outdoor surfaces, independently/no LOB for  improved outdoor and community gait.   Baseline Met 11/10.   Time 4   Period Weeks   Status Achieved               Plan - 08/20/16 1038    Clinical Impression Statement Patient able to reproduce HEP with min cues with pictures from previous sessions provided (did not bring HEP or flowsheet).  Feel he is improved and needs to progress on his own.  Patient expressed concern being without resources so feel he will benefit from community resources for fitness with plan for continuing after d/c.  Only one more scheduled visit.  May plan to wrap up next session if ready versus one more visit to initiate UE therex HEP and ensure independence at pt request   Rehab Potential Good   PT Frequency 2x / week   PT Duration 4 weeks   PT Treatment/Interventions ADLs/Self Care Home Management;Functional mobility training;Gait training;Therapeutic activities;Therapeutic exercise;Balance training;Neuromuscular re-education;Patient/family education   PT Next Visit Plan Initiate UE therex HEP (postural) and discuss community resources for continued fitness post d/c.  Consider d/c versus one more visit for indep with new HEP.   Consulted and Agree with Plan of Care Patient      Patient will benefit from skilled therapeutic intervention in order to improve the following deficits and impairments:  Abnormal gait, Decreased balance, Decreased strength, Decreased mobility, Difficulty walking  Visit Diagnosis: Unsteadiness on feet  Other abnormalities of gait and mobility  Muscle weakness (generalized)     Problem List Patient Active Problem List   Diagnosis  Date Noted  . Dyspnea   . Macrocytic anemia 06/04/2016  . Thrombocytopenia ([place]) 06/04/2016  . Exertional dyspnea 06/04/2016  . Arm paresthesia, left 06/04/2016  . Dyspnea on exertion 06/04/2016  . Tenderness of right calf 06/04/2016  . Mild cognitive impairment with memory loss 01/12/2016  . ESRD on dialysis ([place]) 05/17/2015  . Hypoglycemia   . Weakness 02/18/2015  . OSA (obstructive sleep apnea) 02/02/2015  . Hyperkalemia 10/05/2014  . Chronic kidney disease (CKD), stage IV (severe) ([place]) 04/07/2014  . End stage renal disease ([place]) 11/18/2013  . BPH (benign prostatic hyperplasia) 11/22/2012  . Peripheral vascular disease ([place]) 12/24/2011  . Hyperparathyroidism 12/24/2011  . Hypothyroidism 12/24/2011  . Allergic rhinitis 12/24/2011  . Erectile dysfunction 12/24/2011  . Insulin dependent diabetes mellitus ([place]) 09/03/2008  . HYPERLIPIDEMIA 09/03/2008  . Essential hypertension 08/30/2008  . PANCREATITIS, HX OF 08/30/2008  . RENAL FAILURE, ACUTE, HX OF 08/30/2008  . DIVERTICULOSIS, COLON 06/08/2003    Reginia Naas 08/20/2016, 10:43 AM  Magda Kiel, PT [phone] 08/20/2016   [facility] [address] [place], [place], [address] Phone: [phone]   Fax:  [phone]  Name: [name] MRN: [id number] Date of Birth: [birth year]

## 2016-08-22 ENCOUNTER — Ambulatory Visit: Payer: Medicare Other | Admitting: *Deleted

## 2016-08-22 DIAGNOSIS — R2689 Other abnormalities of gait and mobility: Secondary | ICD-10-CM

## 2016-08-22 DIAGNOSIS — M6281 Muscle weakness (generalized): Secondary | ICD-10-CM | POA: Diagnosis not present

## 2016-08-22 DIAGNOSIS — D631 Anemia in chronic kidney disease: Secondary | ICD-10-CM | POA: Diagnosis not present

## 2016-08-22 DIAGNOSIS — E1129 Type 2 diabetes mellitus with other diabetic kidney complication: Secondary | ICD-10-CM | POA: Diagnosis not present

## 2016-08-22 DIAGNOSIS — R2681 Unsteadiness on feet: Secondary | ICD-10-CM

## 2016-08-22 DIAGNOSIS — N2581 Secondary hyperparathyroidism of renal origin: Secondary | ICD-10-CM | POA: Diagnosis not present

## 2016-08-22 DIAGNOSIS — N186 End stage renal disease: Secondary | ICD-10-CM | POA: Diagnosis not present

## 2016-08-22 NOTE — Therapy (Signed)
[facility] [address] [place], [place], [address] Phone: [phone]   Fax:  [phone]  Physical Therapy Treatment  Patient Details  Name: [name] MRN: [id number] Date of Birth: 1940-11-05 Referring Provider: Oren Binet, MD (hospital) (family doctor is Tedra Senegal)  Encounter Date: 08/22/2016      PT End of Session - 08/22/16 1332    Visit Number 8   Number of Visits 9   Date for PT Re-Evaluation 09/09/16   Authorization Type Medicare primary/Tricare 2nd (NO PTAs); GCODE every 10th visit   PT Start Time 0930   PT Stop Time 1013   PT Time Calculation (min) 43 min   Activity Tolerance Patient tolerated treatment well   Behavior During Therapy [facility] for tasks assessed/performed      Past Medical History:  Diagnosis Date  . Allergy   . Anemia   . Arthritis   . Coronary artery disease   . Diabetes mellitus    Type 2  . Diverticulitis   . ED (erectile dysfunction)   . Elevated homocysteine ([place])   . ESRD (end stage renal disease) on dialysis ([place]) 03/2015  . GERD (gastroesophageal reflux disease)    pepto   . Gout   . Hyperlipidemia   . Hypertension   . Hypothyroidism   . Pneumonia   . PVD (peripheral vascular disease) ([place])    has plastic aorta  . Renal insufficiency   . Seasonal allergies   . Shortness of breath dyspnea   . Sleep apnea   . Thyroid disease     Past Surgical History:  Procedure Laterality Date  . aortobifemoral bypass    . AV FISTULA PLACEMENT Left 12/01/2013   Procedure: ARTERIOVENOUS (AV) FISTULA CREATION- LEFT BRACHIOCEPHALIC;  Surgeon: Angelia Mould, MD;  Location: [place];  Service: Vascular;  Laterality: Left;  . [place] TRANSPOSITION Right 07/27/2014   Procedure: BASCILIC VEIN TRANSPOSITION;  Surgeon: Angelia Mould, MD;  Location: [place];  Service: Vascular;  Laterality: Right;  . BREAST SURGERY     left - granulomatous mastitis  .  COLONOSCOPY    . 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 EYE SURGERY Bilateral    cataracts  . REVISON OF ARTERIOVENOUS FISTULA Left 02/09/2014   Procedure: REVISON OF LEFT ARTERIOVENOUS FISTULA - RESECTION OF RENDUNDANT VEIN;  Surgeon: Angelia Mould, MD;  Location: [facility];  Service: Vascular;  Laterality: Left;  . SBO with lysis adhesions    . SHUNTOGRAM Left 04/19/2014   Procedure: FISTULOGRAM;  Surgeon: Angelia Mould, MD;  Location: [facility] CATH LAB;  Service: Cardiovascular;  Laterality: Left;  . UNILATERAL UPPER EXTREMEITY ANGIOGRAM N/A 07/12/2014   Procedure: UNILATERAL UPPER Anselmo Rod;  Surgeon: Angelia Mould, MD;  Location: [facility] CATH LAB;  Service: Cardiovascular;  Laterality: N/A;    There were no vitals filed for this visit.      Subjective Assessment - 08/22/16 0934    Subjective Had root canal yesterday, didn't sleep too well.  Just taking Tylenol 3.  Feel some better today. Been eating soup.    Pertinent History * BP on L forearm only.   On dialysis M, W, F, peripheral artery disease, myalgia, diabetes, chronic kidney disease   Pain Score 4    Pain Location Jaw   Pain Orientation Left   Pain Descriptors / Indicators Aching;Sore   Pain Onset In the past 7 days   Pain Frequency Constant  Aggravating Factors  chewing on that side   Pain Relieving Factors rest                         [facility] Adult PT Treatment/Exercise - 08/22/16 0001      Exercises   Exercises Elbow     Elbow Exercises   Elbow Flexion Strengthening;Both;15 reps;Seated;Theraband   Theraband Level (Elbow Flexion) Level 2 (Red)   Elbow Extension Strengthening;15 reps;Both;Seated;Theraband   Theraband Level (Elbow Extension) Level 2 (Red)     Knee/Hip Exercises: Aerobic   Other Aerobic scifit level 2.5 LE's only x 5.5 min     Shoulder Exercises: Supine   Horizontal ABduction Both;15 reps;Theraband   Theraband Level (Shoulder Horizontal ABduction)  Level 2 (Red)   External Rotation Strengthening;Both;15 reps;Theraband   Theraband Level (Shoulder External Rotation) Level 2 (Red)   Flexion Strengthening;10 reps;Theraband;Both   Theraband Level (Shoulder Flexion) Level 2 (Red)     Shoulder Exercises: Standing   Row Strengthening;Both;15 reps;Theraband   Theraband Level (Shoulder Row) Level 2 (Red)                PT Education - 08/22/16 1331    Education provided Yes   Education Details UE HEP   Person(s) Educated Patient   Methods Demonstration;Explanation;Handout   Comprehension Need further instruction;Verbalized understanding;Returned demonstration             PT Long Term Goals - 08/03/16 0905      PT LONG TERM GOAL #1   Title Pt will be independent with HEP for imrpoved balance, strength and gait.  TARGET 08/11/16   Time 4   Period Weeks   Status On-going     PT LONG TERM GOAL #2   Title Pt will improve TUG score to less than or equal to 13.5 seconds for decreased fall risk.   Baseline 11.10: TUG = 10.55 seconds   Time 4   Period Weeks   Status Achieved     PT LONG TERM GOAL #3   Title Pt will improve Dynamic Gait Index score to at least 20/24 for decreased fall risk.   Baseline 11/10: DGI = 22/24   Time 4   Period Weeks   Status Achieved     PT LONG TERM GOAL #4   Title Pt will improve 5x sit<>stand transfers to less than or equal to 16 seconds for improved efficiency and safety with transfers.   Baseline 11/10: 5x STS Test = 15.57 seconds   Time 4   Period Weeks   Status Achieved     PT LONG TERM GOAL #5   Title Pt will ambulate at least 1000 ft indoor and outdoor surfaces, independently/no LOB for improved outdoor and community gait.   Baseline Met 11/10.   Time 4   Period Weeks   Status Achieved               Plan - 08/22/16 1333    Clinical Impression Statement Patient eager to progress and performed UE therex this session with some fatigue, but happy to be working towards  some strengthening,  Feel he needs at least one more session to assess HEP performance and to give community resource information.     Rehab Potential Good   PT Frequency 2x / week   PT Duration 4 weeks   PT Treatment/Interventions ADLs/Self Care Home Management;Functional mobility training;Gait training;Therapeutic activities;Therapeutic exercise;Balance training;Neuromuscular re-education;Patient/family education   PT Next Visit Plan check HEP for  UE's, give info on community fitness opportunities   Consulted and Agree with Plan of Care Patient      Patient will benefit from skilled therapeutic intervention in order to improve the following deficits and impairments:  Abnormal gait, Decreased balance, Decreased strength, Decreased mobility, Difficulty walking  Visit Diagnosis: Unsteadiness on feet  Other abnormalities of gait and mobility  Muscle weakness (generalized)     Problem List Patient Active Problem List   Diagnosis Date Noted  . Dyspnea   . Macrocytic anemia 06/04/2016  . Thrombocytopenia ([place]) 06/04/2016  . Exertional dyspnea 06/04/2016  . Arm paresthesia, left 06/04/2016  . Dyspnea on exertion 06/04/2016  . Tenderness of right calf 06/04/2016  . Mild cognitive impairment with memory loss 01/12/2016  . ESRD on dialysis ([place]) 05/17/2015  . Hypoglycemia   . Weakness 02/18/2015  . OSA (obstructive sleep apnea) 02/02/2015  . Hyperkalemia 10/05/2014  . Chronic kidney disease (CKD), stage IV (severe) ([place]) 04/07/2014  . End stage renal disease ([place]) 11/18/2013  . BPH (benign prostatic hyperplasia) 11/22/2012  . Peripheral vascular disease ([place]) 12/24/2011  . Hyperparathyroidism 12/24/2011  . Hypothyroidism 12/24/2011  . Allergic rhinitis 12/24/2011  . Erectile dysfunction 12/24/2011  . Insulin dependent diabetes mellitus ([place]) 09/03/2008  . HYPERLIPIDEMIA 09/03/2008  . Essential hypertension 08/30/2008  . PANCREATITIS, HX OF 08/30/2008  . RENAL FAILURE, ACUTE,  HX OF 08/30/2008  . DIVERTICULOSIS, COLON 06/08/2003    Reginia Naas 08/22/2016, 1:35 PM  Magda Kiel, [place] 08/22/2016   [facility] [address] [place], [place], [address] Phone: [phone]   Fax:  [phone]  Name: [name] MRN: [id number] Date of Birth: 1940-10-03

## 2016-08-22 NOTE — Patient Instructions (Addendum)
Resisted Horizontal Abduction: Bilateral    Lying on your back , tubing in both hands, arms out in front. Keeping arms straight, pinch shoulder blades together and stretch arms out. Repeat _15___ times per set. Do __1__ sets per session. Do __1__ sessions per day.  http://orth.exer.us/968   Copyright  VHI. All rights reserved.  Resisted External Rotation: in Neutral - Bilateral    Lying on your back, tubing in both hands, elbows at sides, bent to 90, forearms forward. Pinch shoulder blades together and rotate forearms out. Keep elbows at sides. Repeat __15__ times per set. Do __1__ sets per session. Do __1__ sessions per day.  http://orth.exer.us/966   Copyright  VHI. All rights reserved.  Strengthening: Resisted Flexion    Hold tubing with left arm at side to stabilize, Pull with right armforward and up. Move shoulder through pain-free range of motion. Repeat _15___ times per set. Do __1__ sets per session. Do __1__ sessions per day.  http://orth.exer.us/824   Copyright  VHI. All rights reserved.  Triceps Extension    Arms bent at 90, in front of body at shoulder height, end of band in each hand, extend arms out to sides. Hold __3_ seconds. Repeat __15_ times. Do 1Low Row: Standing    Face anchor, feet shoulder width apart. Palms up, pull arms back, squeezing shoulder blades together. Repeat _15_ times per set. Do _1_ sets per session. Do _7_ sessions per week. Anchor Height: Waist  http://tub.exer.us/65     Copyright  VHI. All rights reserved.

## 2016-08-23 DIAGNOSIS — Z992 Dependence on renal dialysis: Secondary | ICD-10-CM | POA: Diagnosis not present

## 2016-08-23 DIAGNOSIS — N186 End stage renal disease: Secondary | ICD-10-CM | POA: Diagnosis not present

## 2016-08-23 DIAGNOSIS — E1122 Type 2 diabetes mellitus with diabetic chronic kidney disease: Secondary | ICD-10-CM | POA: Diagnosis not present

## 2016-08-24 ENCOUNTER — Other Ambulatory Visit: Payer: Medicare Other

## 2016-08-24 DIAGNOSIS — E1129 Type 2 diabetes mellitus with other diabetic kidney complication: Secondary | ICD-10-CM | POA: Diagnosis not present

## 2016-08-24 DIAGNOSIS — N186 End stage renal disease: Secondary | ICD-10-CM | POA: Diagnosis not present

## 2016-08-24 DIAGNOSIS — N2581 Secondary hyperparathyroidism of renal origin: Secondary | ICD-10-CM | POA: Diagnosis not present

## 2016-08-24 DIAGNOSIS — D509 Iron deficiency anemia, unspecified: Secondary | ICD-10-CM | POA: Diagnosis not present

## 2016-08-27 DIAGNOSIS — N2581 Secondary hyperparathyroidism of renal origin: Secondary | ICD-10-CM | POA: Diagnosis not present

## 2016-08-27 DIAGNOSIS — N186 End stage renal disease: Secondary | ICD-10-CM | POA: Diagnosis not present

## 2016-08-28 ENCOUNTER — Ambulatory Visit: Payer: Medicare Other | Admitting: Endocrinology

## 2016-08-29 ENCOUNTER — Other Ambulatory Visit (INDEPENDENT_AMBULATORY_CARE_PROVIDER_SITE_OTHER): Payer: Medicare Other

## 2016-08-29 ENCOUNTER — Ambulatory Visit: Payer: Medicare Other | Attending: Internal Medicine | Admitting: *Deleted

## 2016-08-29 ENCOUNTER — Telehealth: Payer: Self-pay

## 2016-08-29 DIAGNOSIS — E1129 Type 2 diabetes mellitus with other diabetic kidney complication: Secondary | ICD-10-CM | POA: Diagnosis not present

## 2016-08-29 DIAGNOSIS — N186 End stage renal disease: Secondary | ICD-10-CM | POA: Diagnosis not present

## 2016-08-29 DIAGNOSIS — R2681 Unsteadiness on feet: Secondary | ICD-10-CM | POA: Insufficient documentation

## 2016-08-29 DIAGNOSIS — M6281 Muscle weakness (generalized): Secondary | ICD-10-CM | POA: Insufficient documentation

## 2016-08-29 DIAGNOSIS — N2581 Secondary hyperparathyroidism of renal origin: Secondary | ICD-10-CM | POA: Diagnosis not present

## 2016-08-29 DIAGNOSIS — E1165 Type 2 diabetes mellitus with hyperglycemia: Secondary | ICD-10-CM

## 2016-08-29 DIAGNOSIS — R2689 Other abnormalities of gait and mobility: Secondary | ICD-10-CM | POA: Insufficient documentation

## 2016-08-29 DIAGNOSIS — D509 Iron deficiency anemia, unspecified: Secondary | ICD-10-CM | POA: Diagnosis not present

## 2016-08-29 DIAGNOSIS — Z794 Long term (current) use of insulin: Secondary | ICD-10-CM | POA: Diagnosis not present

## 2016-08-29 LAB — GLUCOSE, RANDOM: Glucose, Bld: 65 mg/dL — ABNORMAL LOW (ref 70–99)

## 2016-08-29 NOTE — Telephone Encounter (Signed)
Was last scheduled visit.  Encouraged to call for new appointment if desired.

## 2016-08-30 ENCOUNTER — Ambulatory Visit (INDEPENDENT_AMBULATORY_CARE_PROVIDER_SITE_OTHER): Payer: Medicare Other | Admitting: Endocrinology

## 2016-08-30 ENCOUNTER — Encounter: Payer: Self-pay | Admitting: Endocrinology

## 2016-08-30 VITALS — HR 74 | Ht 71.0 in | Wt 201.0 lb

## 2016-08-30 DIAGNOSIS — E1165 Type 2 diabetes mellitus with hyperglycemia: Secondary | ICD-10-CM | POA: Diagnosis not present

## 2016-08-30 DIAGNOSIS — Z794 Long term (current) use of insulin: Secondary | ICD-10-CM

## 2016-08-30 LAB — FRUCTOSAMINE: Fructosamine: 407 umol/L — ABNORMAL HIGH (ref 0–285)

## 2016-08-30 NOTE — Progress Notes (Signed)
Patient ID: [name], male   DOB: 1941/07/03, 75 y.o.   MRN: [id number]           Reason for Appointment:  Follow-up for Type 2 Diabetes  Referring physician: [place]  History of Present Illness:          Date of diagnosis of type 2 diabetes mellitus :        Background history:  He has had long-standing diabetes probably treated with metformin initially and subsequently with sulfonylurea drugs At some point he was also given Actos in addition to his glipizide which he is still taking Records of his control are only available for the last 4 years Over the last few years his A1c has been generally in the 6-7% range His A1c was 6.2 in May 2016 and apparently was starting to get low normal blood sugars at times He was admitted to the hospital for fluid overload and renal failure and at that time his Actos was stopped Subsequently his blood sugars have been markedly increased He was switched from regular insulin to NovoLog on his initial consultation  Recent history:    His last A1c is 7.5 and previously as high as 8.9 Fructosamine now is 407  INSULIN regimen is described as: Lantus 10 units in am; 8 units Novolog ac bid with syringe    Dialysis is done at 12 noon, doing it on Monday/Wednesday/Fridays     Current blood sugar patterns and problems identified:  He is here for short-term follow-up because of recent poor blood sugar control  Also he was started back on Actos on his last visit which he had not been getting for various reasons  However his blood sugars appear to be significantly high and he thinks his sugars are high because of having a respiratory illness, apparently not getting any steroids  He has checked his blood sugar only sporadically as apparently his monitor was not working  He has incorrect date programmed on his meter and appears to have only a few readings, mostly around 200 including in the morning  He had a glucose of 65 in the office but  apparently he took his NovoLog that morning and did not eat  He also thinks that his blood sugars are higher when he comes back from dialysis in the afternoon, this is apparently because a he is eating constantly while at dialysis with peanut butter crackers, chips and other snacks  Not able to do much exercise  Oral hypoglycemic drugs the patient is taking are: not on Actos 30 mg daily in a.m.     Side effects from medications have been: None  Compliance with the medical regimen: Good   Glucose monitoring:  done 0-2  times a day         Glucometer:  Freestyle  Blood Glucose readings by review of home monitoring as above:    Self-care: The diet that the patient has been following is: tries to limit sweets, portions. eating out only about once a week at a steak house        Typical meal intake: Breakfast is  eggs and protein shake at 9 am, usually light lunch with mostly snacks like peanut butter crackers or half sandwich; supper 8 pm   Lunch is irregular on nondialysis days but has a Larger lunch on Sundays             Dietician visit, most recent: ?  Exercise: none, not able to do much   Weight history:  Wt Readings from Last 3 Encounters:  08/30/16 201 lb (91.2 kg)  07/17/16 201 lb (91.2 kg)  07/11/16 202 lb 9.6 oz (91.9 kg)    Glycemic control:   Lab Results  Component Value Date   HGBA1C 7.5 (H) 07/10/2016   HGBA1C 7.4 (H) 06/14/2016   HGBA1C 7.2 (H) 01/10/2016   Lab Results  Component Value Date   MICROALBUR 354.8 (H) 02/14/2015   LDLCALC 33 01/10/2016   CREATININE 5.58 (H) 06/14/2016         Medication List       Accurate as of 08/30/16  5:06 PM. Always use your most recent med list.          acetaminophen 500 MG tablet Commonly known as:  TYLENOL Take 500 mg by mouth every 6 (six) hours as needed (for pain).   allopurinol 100 MG tablet Commonly known as:  ZYLOPRIM Take 100 mg by mouth daily.   aspirin 325 MG tablet Take 650 mg by  mouth daily.   clobetasol 0.05 % external solution Commonly known as:  TEMOVATE apply to affected area ON THE SCALP UP TO 2 TIMES A DAY AS NEEDED...  (REFER TO PRESCRIPTION NOTES).   donepezil 10 MG tablet Commonly known as:  ARICEPT Take 1 tablet (10 mg total) by mouth at bedtime.   Fluocinolone Acetonide Scalp 0.01 % Oil apply to affected area ON SCALP EVERY NIGHT AT BEDTIME, WASH UPON AWAKENING   fluticasone 0.05 % cream Commonly known as:  CUTIVATE apply to affected area ON FACE UP TO 2 TIMES A DAY AS NEEDED   fluticasone 50 MCG/ACT nasal spray Commonly known as:  FLONASE Place 1 spray into both nostrils daily.   FREESTYLE FREEDOM LITE w/Device Kit Use to check blood sugar 2 times per day dx code E11.65   freestyle lancets Use as instructed to check blood sugar 2 times per day dx code E11.65   glucose blood test strip Commonly known as:  ONETOUCH VERIO Use to check blood sugar 2 times per day. Dx code E11.65   insulin aspart 100 UNIT/ML injection Commonly known as:  novoLOG Inject 10 Units into the skin 3 (three) times daily before meals.   insulin glargine 100 UNIT/ML injection Commonly known as:  LANTUS Inject 0.08 mLs (8 Units total) into the skin every morning.   ketoconazole 2 % shampoo Commonly known as:  NIZORAL Apply 1 application topically 2 (two) times a week.   levothyroxine 50 MCG tablet Commonly known as:  SYNTHROID, LEVOTHROID Take 1 tablet (50 mcg total) by mouth daily.   lidocaine-prilocaine cream Commonly known as:  EMLA Apply 1 application topically once. AS DIRECTED   loratadine 10 MG tablet Commonly known as:  CLARITIN Take 10 mg by mouth daily as needed for allergies.   metroNIDAZOLE 0.75 % gel Commonly known as:  METROGEL APPLY TO AFFECTED AREA ON SKIN  ON FACE TWICE DAILY   mupirocin ointment 2 % Commonly known as:  BACTROBAN Use as directed to affected areas   Naftifine HCl 2 % Crea Commonly known as:  NAFTIN Apply to feet  daily for fungal infection   pioglitazone 30 MG tablet Commonly known as:  ACTOS Take 1 tablet (30 mg total) by mouth daily.   RENVELA 800 MG tablet Generic drug:  sevelamer carbonate Take 800-2,400 mg by mouth See admin instructions. 1,600-2,400 mg with breakfast then 1,600 mg with lunch then 2,400 mg with  dinner (evening meal) and 800 mg with each snack   SENSIPAR 30 MG tablet Generic drug:  cinacalcet Take 30 mg by mouth daily.   simvastatin 20 MG tablet Commonly known as:  ZOCOR Take 20 mg by mouth at bedtime.   triamcinolone ointment 0.1 % Commonly known as:  KENALOG Apply 1 application topically 2 (two) times daily. Use between thighs       Allergies:  Allergies  Allergen Reactions  . Penicillins Rash    Has patient had a PCN reaction causing immediate rash, facial/tongue/throat swelling, SOB or lightheadedness with hypotension: Yes Has patient had a PCN reaction causing severe rash involving mucus membranes or skin necrosis: Yes Has patient had a PCN reaction that required hospitalization: No Has patient had a PCN reaction occurring within the last 10 years: No If all of the above answers are "NO", then may proceed with Cephalosporin use.     Past Medical History:  Diagnosis Date  . Allergy   . Anemia   . Arthritis   . Coronary artery disease   . Diabetes mellitus    Type 2  . Diverticulitis   . ED (erectile dysfunction)   . Elevated homocysteine ([place])   . ESRD (end stage renal disease) on dialysis ([place]) 03/2015  . GERD (gastroesophageal reflux disease)    pepto   . Gout   . Hyperlipidemia   . Hypertension   . Hypothyroidism   . Pneumonia   . PVD (peripheral vascular disease) ([place])    has plastic aorta  . Renal insufficiency   . Seasonal allergies   . Shortness of breath dyspnea   . Sleep apnea   . Thyroid disease     Past Surgical History:  Procedure Laterality Date  . aortobifemoral bypass    . AV FISTULA PLACEMENT Left 12/01/2013    Procedure: ARTERIOVENOUS (AV) FISTULA CREATION- LEFT BRACHIOCEPHALIC;  Surgeon: Angelia Mould, MD;  Location: [place];  Service: Vascular;  Laterality: Left;  . [place] TRANSPOSITION Right 07/27/2014   Procedure: BASCILIC VEIN TRANSPOSITION;  Surgeon: Angelia Mould, MD;  Location: [facility];  Service: Vascular;  Laterality: Right;  . BREAST SURGERY     left - granulomatous mastitis  . COLONOSCOPY    . 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 EYE SURGERY Bilateral    cataracts  . REVISON OF ARTERIOVENOUS FISTULA Left 02/09/2014   Procedure: REVISON OF LEFT ARTERIOVENOUS FISTULA - RESECTION OF RENDUNDANT VEIN;  Surgeon: Angelia Mould, MD;  Location: [place];  Service: Vascular;  Laterality: Left;  . SBO with lysis adhesions    . SHUNTOGRAM Left 04/19/2014   Procedure: FISTULOGRAM;  Surgeon: Angelia Mould, MD;  Location: [facility] CATH LAB;  Service: Cardiovascular;  Laterality: Left;  . UNILATERAL UPPER EXTREMEITY ANGIOGRAM N/A 07/12/2014   Procedure: UNILATERAL UPPER Anselmo Rod;  Surgeon: Angelia Mould, MD;  Location: [facility] CATH LAB;  Service: Cardiovascular;  Laterality: N/A;    Family History  Problem Relation Age of Onset  . Aneurysm Mother   . Heart disease Father   . Stroke Father   . Hypertension Father   . Diabetes Father   . Dementia Neg Hx     Social History:  reports that he quit smoking about 21 years ago. He has never used smokeless tobacco. He reports that he does not drink alcohol or use drugs.    Review of Systems    Lipid history: He has been treated with  Atorvastatin, LDL relatively low    Lab Results  Component Value Date   CHOL 78 (L) 01/10/2016   HDL 31 (L) 01/10/2016   LDLCALC 33 01/10/2016   TRIG 68 01/10/2016   CHOLHDL 2.5 01/10/2016          He has ESRD on dialysis    Most recent eye exam was In 01/2016 with retinopathy ?  Proliferative  Hypertension: This has been long-standing Unable to check blood  pressure today because of his having vascular access bilaterally    LABS:  Lab on 08/29/2016  Component Date Value Ref Range Status  . Glucose, Bld 08/29/2016 65* 70 - 99 mg/dL Final  . Fructosamine 08/30/2016 407* 0 - 285 umol/L Final   Comment: Published reference interval for apparently healthy subjects between age 39 and 56 is 18 - 285 umol/L and in a poorly controlled diabetic population is 228 - 563 umol/L with a mean of 396 umol/L.     Physical Examination:  Pulse 74   Ht 5' 11" (1.803 m)   Wt 201 lb (91.2 kg)   SpO2 98%   BMI 28.03 kg/m       ASSESSMENT:  Diabetes type 2, uncontrolled See history of present illness for detailed discussion of his current management, blood sugar patterns and problems identified  He again has higher readings even though he has gone back on Actos Still taking low doses of insulin He thinks his blood sugars are higher because of having respiratory illness However he tends to be getting higher fat snacks during the day while on dialysis for at least Most likely will need increasing doses of insulin  He has not checked her sugar much because of difficulties with his meter, this is working now and the date was programmed correctly.   PLAN:   He will check his blood sugars more often and some after meals on a regular basis, discussed blood sugar targets  Increase Lantus to 13 units at least until fasting readings are still over about 100  He will need to stop eating high-fat snacks like chips  Increase NovoLog to 10 units at meal times  He will take 5 units Novolog when he starts his snack at dialysis  Recheck A1c on the next visit.  Discussed adjusting the NovoLog further if blood sugars are consistently high after meals but also to let us know if he tends to have low sugars  Also reminded him that Novolog needs to be taken 5-10 minutes before eating and not to take it if he is going to be fasting or skipping a  meal   Patient Instructions  Lantus 13 units until sugars in am below 90   Take 10 Novolog at meals to keep the sugar from going > 200 after the meal  Check blood sugars on waking up    Also check blood sugars about 2 hours after a meal and do this after different meals by rotation  Recommended blood sugar levels on waking up is 90-130 and about 2 hours after meal is 130-160  Please bring your blood sugar monitor to each visit, thank you  Avoid chips   5 Novolog when snacking       Counseling time on subjects discussed above is over 50% of today's 25 minute visit       , 08/30/2016, 5:06 PM   Note: This office note was prepared with Dragon voice recognition system technology. Any transcriptional errors that result from this process are unintentional.

## 2016-08-30 NOTE — Patient Instructions (Addendum)
Lantus 13 units until sugars in am below 90   Take 10 Novolog at meals to keep the sugar from going > 200 after the meal  Check blood sugars on waking up    Also check blood sugars about 2 hours after a meal and do this after different meals by rotation  Recommended blood sugar levels on waking up is 90-130 and about 2 hours after meal is 130-160  Please bring your blood sugar monitor to each visit, thank you  Avoid chips   5 Novolog when snacking

## 2016-08-31 DIAGNOSIS — D509 Iron deficiency anemia, unspecified: Secondary | ICD-10-CM | POA: Diagnosis not present

## 2016-08-31 DIAGNOSIS — N186 End stage renal disease: Secondary | ICD-10-CM | POA: Diagnosis not present

## 2016-08-31 DIAGNOSIS — N2581 Secondary hyperparathyroidism of renal origin: Secondary | ICD-10-CM | POA: Diagnosis not present

## 2016-08-31 DIAGNOSIS — E1129 Type 2 diabetes mellitus with other diabetic kidney complication: Secondary | ICD-10-CM | POA: Diagnosis not present

## 2016-09-03 ENCOUNTER — Other Ambulatory Visit: Payer: Medicare Other | Admitting: Internal Medicine

## 2016-09-03 ENCOUNTER — Other Ambulatory Visit: Payer: Self-pay | Admitting: Internal Medicine

## 2016-09-03 DIAGNOSIS — Z794 Long term (current) use of insulin: Secondary | ICD-10-CM

## 2016-09-03 DIAGNOSIS — E785 Hyperlipidemia, unspecified: Secondary | ICD-10-CM | POA: Diagnosis not present

## 2016-09-03 DIAGNOSIS — E1129 Type 2 diabetes mellitus with other diabetic kidney complication: Secondary | ICD-10-CM | POA: Diagnosis not present

## 2016-09-03 DIAGNOSIS — N186 End stage renal disease: Secondary | ICD-10-CM | POA: Diagnosis not present

## 2016-09-03 DIAGNOSIS — E119 Type 2 diabetes mellitus without complications: Secondary | ICD-10-CM

## 2016-09-03 DIAGNOSIS — N2581 Secondary hyperparathyroidism of renal origin: Secondary | ICD-10-CM | POA: Diagnosis not present

## 2016-09-03 DIAGNOSIS — D509 Iron deficiency anemia, unspecified: Secondary | ICD-10-CM | POA: Diagnosis not present

## 2016-09-03 DIAGNOSIS — E213 Hyperparathyroidism, unspecified: Secondary | ICD-10-CM | POA: Diagnosis not present

## 2016-09-03 DIAGNOSIS — IMO0001 Reserved for inherently not codable concepts without codable children: Secondary | ICD-10-CM

## 2016-09-03 DIAGNOSIS — E039 Hypothyroidism, unspecified: Secondary | ICD-10-CM | POA: Diagnosis not present

## 2016-09-03 DIAGNOSIS — I10 Essential (primary) hypertension: Secondary | ICD-10-CM

## 2016-09-03 DIAGNOSIS — R0600 Dyspnea, unspecified: Secondary | ICD-10-CM | POA: Diagnosis not present

## 2016-09-03 DIAGNOSIS — N529 Male erectile dysfunction, unspecified: Secondary | ICD-10-CM | POA: Diagnosis not present

## 2016-09-03 DIAGNOSIS — R413 Other amnesia: Secondary | ICD-10-CM | POA: Diagnosis not present

## 2016-09-03 LAB — CBC WITH DIFFERENTIAL/PLATELET
Basophils Absolute: 0 cells/uL (ref 0–200)
Basophils Relative: 0 %
Eosinophils Absolute: 260 cells/uL (ref 15–500)
Eosinophils Relative: 4 %
HCT: 34.5 % — ABNORMAL LOW (ref 38.5–50.0)
Hemoglobin: 11.2 g/dL — ABNORMAL LOW (ref 13.2–17.1)
Lymphocytes Relative: 36 %
Lymphs Abs: 2340 cells/uL (ref 850–3900)
MCH: 35 pg — ABNORMAL HIGH (ref 27.0–33.0)
MCHC: 32.5 g/dL (ref 32.0–36.0)
MCV: 107.8 fL — ABNORMAL HIGH (ref 80.0–100.0)
MPV: 9.9 fL (ref 7.5–12.5)
Monocytes Absolute: 455 cells/uL (ref 200–950)
Monocytes Relative: 7 %
Neutro Abs: 3445 cells/uL (ref 1500–7800)
Neutrophils Relative %: 53 %
Platelets: 186 10*3/uL (ref 140–400)
RBC: 3.2 MIL/uL — ABNORMAL LOW (ref 4.20–5.80)
RDW: 16.3 % — ABNORMAL HIGH (ref 11.0–15.0)
WBC: 6.5 10*3/uL (ref 3.8–10.8)

## 2016-09-03 LAB — PSA: PSA: 0.4 ng/mL (ref <=4.0)

## 2016-09-03 LAB — HEMOGLOBIN A1C
Hgb A1c MFr Bld: 7.9 % — ABNORMAL HIGH (ref <5.7)
Mean Plasma Glucose: 180 mg/dL

## 2016-09-03 LAB — TSH: TSH: 1.41 mIU/L (ref 0.40–4.50)

## 2016-09-04 ENCOUNTER — Ambulatory Visit (INDEPENDENT_AMBULATORY_CARE_PROVIDER_SITE_OTHER): Payer: Medicare Other | Admitting: Internal Medicine

## 2016-09-04 ENCOUNTER — Telehealth: Payer: Self-pay | Admitting: Internal Medicine

## 2016-09-04 ENCOUNTER — Encounter: Payer: Self-pay | Admitting: Internal Medicine

## 2016-09-04 ENCOUNTER — Ambulatory Visit
Admission: RE | Admit: 2016-09-04 | Discharge: 2016-09-04 | Disposition: A | Discharge status: Home / Self Care | Payer: Medicare Other | Source: Ambulatory Visit | Attending: Internal Medicine | Admitting: Internal Medicine

## 2016-09-04 VITALS — HR 78 | Temp 98.2°F | Ht 71.0 in | Wt 198.0 lb

## 2016-09-04 DIAGNOSIS — Z794 Long term (current) use of insulin: Secondary | ICD-10-CM

## 2016-09-04 DIAGNOSIS — T148XXA Other injury of unspecified body region, initial encounter: Secondary | ICD-10-CM

## 2016-09-04 DIAGNOSIS — R0602 Shortness of breath: Secondary | ICD-10-CM | POA: Diagnosis not present

## 2016-09-04 DIAGNOSIS — Z992 Dependence on renal dialysis: Secondary | ICD-10-CM

## 2016-09-04 DIAGNOSIS — N186 End stage renal disease: Secondary | ICD-10-CM

## 2016-09-04 DIAGNOSIS — I10 Essential (primary) hypertension: Secondary | ICD-10-CM

## 2016-09-04 DIAGNOSIS — E538 Deficiency of other specified B group vitamins: Secondary | ICD-10-CM

## 2016-09-04 DIAGNOSIS — R238 Other skin changes: Secondary | ICD-10-CM | POA: Diagnosis not present

## 2016-09-04 DIAGNOSIS — E784 Other hyperlipidemia: Secondary | ICD-10-CM

## 2016-09-04 DIAGNOSIS — E119 Type 2 diabetes mellitus without complications: Secondary | ICD-10-CM | POA: Diagnosis not present

## 2016-09-04 DIAGNOSIS — R0600 Dyspnea, unspecified: Secondary | ICD-10-CM

## 2016-09-04 DIAGNOSIS — IMO0001 Reserved for inherently not codable concepts without codable children: Secondary | ICD-10-CM

## 2016-09-04 DIAGNOSIS — E7849 Other hyperlipidemia: Secondary | ICD-10-CM

## 2016-09-04 DIAGNOSIS — Z0000 Encounter for general adult medical examination without abnormal findings: Secondary | ICD-10-CM

## 2016-09-04 LAB — LIPID PANEL
Cholesterol: 85 mg/dL (ref <200)
HDL: 33 mg/dL — ABNORMAL LOW (ref >40)
LDL Cholesterol: 31 mg/dL (ref <100)
Total CHOL/HDL Ratio: 2.6 Ratio (ref <5.0)
Triglycerides: 107 mg/dL (ref <150)
VLDL: 21 mg/dL (ref <30)

## 2016-09-04 LAB — COMPLETE METABOLIC PANEL WITH GFR
ALT: 11 U/L (ref 9–46)
AST: 22 U/L (ref 10–35)
Albumin: 3.7 g/dL (ref 3.6–5.1)
Alkaline Phosphatase: 102 U/L (ref 40–115)
BUN: 54 mg/dL — ABNORMAL HIGH (ref 7–25)
CO2: 30 mmol/L (ref 20–31)
Calcium: 8.8 mg/dL (ref 8.6–10.3)
Chloride: 93 mmol/L — ABNORMAL LOW (ref 98–110)
Creat: 9.18 mg/dL — ABNORMAL HIGH (ref 0.70–1.18)
GFR, Est African American: 6 mL/min — ABNORMAL LOW (ref >=60)
GFR, Est Non African American: 5 mL/min — ABNORMAL LOW (ref >=60)
Glucose, Bld: 156 mg/dL — ABNORMAL HIGH (ref 65–99)
Potassium: 4.2 mmol/L (ref 3.5–5.3)
Sodium: 136 mmol/L (ref 135–146)
Total Bilirubin: 0.3 mg/dL (ref 0.2–1.2)
Total Protein: 6 g/dL — ABNORMAL LOW (ref 6.1–8.1)

## 2016-09-04 LAB — B12 AND FOLATE PANEL
Folate: >24 ng/mL (ref >5.4)
Vitamin B-12: 775 pg/mL (ref 200–1100)

## 2016-09-04 LAB — MICROALBUMIN, URINE: Microalb, Ur: 37.2 mg/dL

## 2016-09-04 MED ORDER — MUPIROCIN 2 % EX OINT: TOPICAL_OINTMENT | CUTANEOUS | 99 refills | Status: DC

## 2016-09-04 NOTE — Telephone Encounter (Signed)
-----   Message from Elby Showers, MD sent at 09/04/2016 12:43 PM EST ----- Please call pt. No CHF

## 2016-09-04 NOTE — Progress Notes (Signed)
Subjective:    Patient ID: [name], male    DOB: Feb 11, 1941, 75 y.o.   MRN: [id number]  HPI  75 year old Black Male in today for health maintenance exam and evaluation of multiple medical issues.  Today he said he was short of breath at home before coming to the office. Says he doesn't feel well. EKG shows no acute changes. He will be sent for chest x-ray.  Pulse oximetry is 96% on room air.  He has an excoriated lesion left abdomen near his navel. Doesn't know how this developed. Denies any specific injury. Skin is to needed in that area with no evidence of secondary infection.  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 Previous history of ischemic ATN following an arterial bifemoral bypass graft in 1992. History of gout, hyperlipidemia, hypothyroidism, erectile dysfunction, allergic rhinitis.  He's followed by Dr. Jamal Maes, nephrologist, and also at the [facility]. He is been followed by a nephrologist since 1992. Baseline creatinine initially was around 2 but increased to 2.45 in July 2011. In 2015 a shot was placed in his left arm.  He has an annual ophthalmology exam.  He had a Cardiolite study done by Dr. Pernell Dupre in 2003 that was negative for ischemia. He sees Dr. Linford Arnold, urologist for erectile dysfunction and checks his prostate annually.  History of herpes zoster 2007.  Has a history of B-12 deficiency and is supposed to be getting B-12 injections through neurologist.  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  He has some left-sided abdominal pain from time to time consistent with diverticulitis that response to Cipro and Flagyl. He had a small bowel obstruction treated with surgical lysis of adhesions in 1996.  He takes insulin. He sees Dr. Dwyane Dee for diabetes mellitus.   Social history: He is retired from [employer] and also  from a part-time job at the [employer]. He is married. Wife works for the [employer], [employer]. One adopted daughter in her 66s. Nonsmoker. Social alcohol consumption.  Family history: Father died of congestive heart failure at age 31. Mother died at age 74 of a brain aneurysm. He is an only child.    Review of Systems see above     Objective:   Physical Exam  Constitutional: He appears well-developed and well-nourished.  HENT:  Head: Normocephalic and atraumatic.  Right Ear: External ear normal.  Mouth/Throat: Oropharynx is clear and moist.  Eyes: Conjunctivae and EOM are normal. Pupils are equal, round, and reactive to light. Right eye exhibits no discharge. Left eye exhibits no discharge. No scleral icterus.  Neck: Neck supple. No JVD present. No thyromegaly present.  Cardiovascular: Normal rate and regular rhythm.   No murmur heard. Pulmonary/Chest: Effort normal and breath sounds normal.  Abdominal: Soft. Bowel sounds are normal.  Genitourinary:  Genitourinary Comments: Deferred to urologist  Musculoskeletal: He exhibits no edema.  Lymphadenopathy:    He has no cervical adenopathy.  Neurological: He has normal reflexes. No cranial nerve deficit. Coordination normal.  He does appear to have some issues with memory enterically with remembering appointments  Psychiatric: His behavior is normal. Judgment normal.  Vitals reviewed.  JVD is present 45. Lungs are clear bilaterally. Skin is warm and dry. He has a skin wound adjacent to his navel on the left side. It's been present for several days. He's been cleaning it with peroxide. He's not sure how it happened.  Assessment & Plan:  Periumbilical skin wound etiology unclear. He will dress with Bactroban ointment and we will reevaluate in a couple of weeks.  End-stage kidney disease on dialysis  Memory loss  Hypertension  Diabetes mellitus  Hyperlipidemia  History of gout  Secondary  hyperparathyroidism  Plan: He will have chest x-ray today since is complaining of some shortness of breath. BNP will also be added. B-12 and folate levels are normal. Hemoglobin A1c is 7.9%. In August 2016 it was 9%. Dr. Dwyane Dee has done an excellent job with his diabetes.  I will follow-up on skin wound in a couple of weeks.  Subjective:   Patient presents for Medicare Annual/Subsequent preventive examination.  Review Past Medical/Family/Social:   Risk Factors  Current exercise habits:  Dietary issues discussed:   Cardiac risk factors:  Depression Screen  (Note: if answer to either of the following is "Yes", a more complete depression screening is indicated)   Over the past two weeks, have you felt down, depressed or hopeless? Yes over my health situation Over the past two weeks, have you felt little interest or pleasure in doing things? No Have you lost interest or pleasure in daily life? No Do you often feel hopeless? No Do you cry easily over simple problems? No   Activities of Daily Living  In your present state of health, do you have any difficulty performing the following activities?:   Driving? No  Managing money? No  Feeding yourself? No  Getting from bed to chair? No  Climbing a flight of stairs? No  Preparing food and eating?: No  Bathing or showering? No  Getting dressed: No  Getting to the toilet? No  Using the toilet:No  Moving around from place to place: No  In the past year have you fallen or had a near fall? Yes Are you sexually active? No  Do you have more than one partner? No   Hearing Difficulties: No  Do you often ask people to speak up or repeat themselves? No  Do you experience ringing or noises in your ears? No  Do you have difficulty understanding soft or whispered voices? No  Do you feel that you have a problem with memory? Yes Do you often misplace items? Yes   Home Safety:  Do you have a smoke alarm at your residence? Yes Do you have grab  bars in the bathroom?No Do you have throw rugs in your house? Yes   Cognitive Testing  Alert? Yes Normal Appearance?Yes  Oriented to person? Yes Place? Yes  Time? Yes  Recall of three objects? Not tested Can perform simple calculations? Not tested Displays appropriate judgment?Yes  Can read the correct time from a watch face?Yes   List the Names of Other Physician/Practitioners you currently use:  See referral list for the physicians patient is currently seeing.  Dr. Dwyane Dee, Dr. Lorrene Reid, urologist, [facility], ophthalmologist   Review of Systems: See above   Objective:     General appearance: Appears stated age  obese  Head: Normocephalic, without obvious abnormality, atraumatic  Eyes: conj clear, EOMi PEERLA  Ears: normal TM's and external ear canals both ears  Nose: Nares normal. Septum midline. Mucosa normal. No drainage or sinus tenderness.  Throat: lips, mucosa, and tongue normal; teeth and gums normal  Neck: no adenopathy, no carotid bruit, no JVD, supple, symmetrical, trachea midline and thyroid not enlarged, symmetric, no tenderness/mass/nodules  No CVA tenderness.  Lungs: clear to auscultation bilaterally  Breasts: normal appearance, no masses or  tenderness,  Heart: regular rate and rhythm, S1, S2 normal, no murmur, click, rub or gallop  Abdomen: soft, non-tender; bowel sounds normal; no masses, no organomegaly  Musculoskeletal: ROM normal in all joints, no crepitus, no deformity, Normal muscle strengthen. Back  is symmetric, no curvature. Skin: Skin lesion as described in left,  periumbilical area Lymph nodes: Cervical, supraclavicular, and axillary nodes normal.  Neurologic: CN 2 -12 Normal, Normal symmetric reflexes. Normal coordination and gait  Psych: Alert & Oriented x 3, Mood appear stable.    Assessment:    Annual wellness medicare exam   Plan:    During the course of the visit the patient was educated and counseled about appropriate screening and  preventive services including:   Annual PSA  Annual flu vaccine     Patient Instructions (the written plan) was given to the patient.  Medicare Attestation  I have personally reviewed:  The patient's medical and social history  Their use of alcohol, tobacco or illicit drugs  Their current medications and supplements  The patient's functional ability including ADLs,fall risks, home safety risks, cognitive, and hearing and visual impairment  Diet and physical activities  Evidence for depression or mood disorders  The patient's weight, height, BMI, and visual acuity have been recorded in the chart. I have made referrals, counseling, and provided education to the patient based on review of the above and I have provided the patient with a written personalized care plan for preventive services.

## 2016-09-04 NOTE — Telephone Encounter (Signed)
Informed patients wife of no congestive heart failure.

## 2016-09-04 NOTE — Patient Instructions (Addendum)
Apply Bactroban ointment twice daily to skin wound and return in 10 days. Continue same medications. Have chest x-ray. BNP will be checked for shortness of breath.

## 2016-09-05 DIAGNOSIS — D509 Iron deficiency anemia, unspecified: Secondary | ICD-10-CM | POA: Diagnosis not present

## 2016-09-05 DIAGNOSIS — E1129 Type 2 diabetes mellitus with other diabetic kidney complication: Secondary | ICD-10-CM | POA: Diagnosis not present

## 2016-09-05 DIAGNOSIS — N186 End stage renal disease: Secondary | ICD-10-CM | POA: Diagnosis not present

## 2016-09-05 DIAGNOSIS — N2581 Secondary hyperparathyroidism of renal origin: Secondary | ICD-10-CM | POA: Diagnosis not present

## 2016-09-05 LAB — BRAIN NATRIURETIC PEPTIDE: Brain Natriuretic Peptide: 154.7 pg/mL — ABNORMAL HIGH (ref <100)

## 2016-09-07 DIAGNOSIS — N2581 Secondary hyperparathyroidism of renal origin: Secondary | ICD-10-CM | POA: Diagnosis not present

## 2016-09-07 DIAGNOSIS — D509 Iron deficiency anemia, unspecified: Secondary | ICD-10-CM | POA: Diagnosis not present

## 2016-09-07 DIAGNOSIS — N186 End stage renal disease: Secondary | ICD-10-CM | POA: Diagnosis not present

## 2016-09-07 DIAGNOSIS — E1129 Type 2 diabetes mellitus with other diabetic kidney complication: Secondary | ICD-10-CM | POA: Diagnosis not present

## 2016-09-10 DIAGNOSIS — N186 End stage renal disease: Secondary | ICD-10-CM | POA: Diagnosis not present

## 2016-09-10 DIAGNOSIS — E1129 Type 2 diabetes mellitus with other diabetic kidney complication: Secondary | ICD-10-CM | POA: Diagnosis not present

## 2016-09-10 DIAGNOSIS — D509 Iron deficiency anemia, unspecified: Secondary | ICD-10-CM | POA: Diagnosis not present

## 2016-09-10 DIAGNOSIS — N2581 Secondary hyperparathyroidism of renal origin: Secondary | ICD-10-CM | POA: Diagnosis not present

## 2016-09-12 DIAGNOSIS — N2581 Secondary hyperparathyroidism of renal origin: Secondary | ICD-10-CM | POA: Diagnosis not present

## 2016-09-12 DIAGNOSIS — D509 Iron deficiency anemia, unspecified: Secondary | ICD-10-CM | POA: Diagnosis not present

## 2016-09-12 DIAGNOSIS — E1129 Type 2 diabetes mellitus with other diabetic kidney complication: Secondary | ICD-10-CM | POA: Diagnosis not present

## 2016-09-12 DIAGNOSIS — N186 End stage renal disease: Secondary | ICD-10-CM | POA: Diagnosis not present

## 2016-09-14 ENCOUNTER — Ambulatory Visit: Payer: Medicare Other | Admitting: Physical Therapy

## 2016-09-14 DIAGNOSIS — R2689 Other abnormalities of gait and mobility: Secondary | ICD-10-CM

## 2016-09-14 DIAGNOSIS — D509 Iron deficiency anemia, unspecified: Secondary | ICD-10-CM | POA: Diagnosis not present

## 2016-09-14 DIAGNOSIS — R2681 Unsteadiness on feet: Secondary | ICD-10-CM

## 2016-09-14 DIAGNOSIS — N186 End stage renal disease: Secondary | ICD-10-CM | POA: Diagnosis not present

## 2016-09-14 DIAGNOSIS — M6281 Muscle weakness (generalized): Secondary | ICD-10-CM | POA: Diagnosis not present

## 2016-09-14 DIAGNOSIS — E1129 Type 2 diabetes mellitus with other diabetic kidney complication: Secondary | ICD-10-CM | POA: Diagnosis not present

## 2016-09-14 DIAGNOSIS — N2581 Secondary hyperparathyroidism of renal origin: Secondary | ICD-10-CM | POA: Diagnosis not present

## 2016-09-14 NOTE — Therapy (Signed)
[facility] [address] [place], [place], [address] Phone: [phone]   Fax:  [phone]  Physical Therapy Treatment  Patient Details  Name: [name] MRN: [id number] Date of Birth: 1941/03/24 Referring Provider: Oren Binet, MD (hospital) (family doctor is Tedra [place])  Encounter Date: 09/14/2016      PT End of Session - 09/14/16 1049    Visit Number 9   Authorization Type Medicare primary/Tricare 2nd (NO PTAs); GCODE every 10th visit   PT Start Time 1015   PT Stop Time 1053   PT Time Calculation (min) 38 min   Activity Tolerance Patient tolerated treatment well   Behavior During Therapy [facility] for tasks assessed/performed      Past Medical History:  Diagnosis Date  . Allergy   . Anemia   . Arthritis   . Coronary artery disease   . Diabetes mellitus    Type 2  . Diverticulitis   . ED (erectile dysfunction)   . 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 ESRD (end stage renal disease) on dialysis ([place]) 03/2015  . GERD (gastroesophageal reflux disease)    pepto   . Gout   . Hyperlipidemia   . Hypertension   . Hypothyroidism   . Pneumonia   . PVD (peripheral vascular disease) ([place])    has plastic aorta  . Renal insufficiency   . Seasonal allergies   . Shortness of breath dyspnea   . Sleep apnea   . Thyroid disease     Past Surgical History:  Procedure Laterality Date  . aortobifemoral bypass    . AV FISTULA PLACEMENT Left 12/01/2013   Procedure: ARTERIOVENOUS (AV) FISTULA CREATION- LEFT BRACHIOCEPHALIC;  Surgeon: Angelia Mould, MD;  Location: [facility];  Service: Vascular;  Laterality: Left;  . [place] TRANSPOSITION Right 07/27/2014   Procedure: BASCILIC VEIN TRANSPOSITION;  Surgeon: Angelia Mould, MD;  Location: [place] [facility];  Service: Vascular;  Laterality: Right;  . BREAST SURGERY     left - granulomatous mastitis  . COLONOSCOPY    . 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 ([place] [facility])  1992  .  EYE SURGERY Bilateral    cataracts  . REVISON OF ARTERIOVENOUS FISTULA Left 02/09/2014   Procedure: REVISON OF LEFT ARTERIOVENOUS FISTULA - RESECTION OF RENDUNDANT VEIN;  Surgeon: Angelia Mould, MD;  Location: [facility];  Service: Vascular;  Laterality: Left;  . SBO with lysis adhesions    . SHUNTOGRAM Left 04/19/2014   Procedure: FISTULOGRAM;  Surgeon: Angelia Mould, MD;  Location: [facility] CATH LAB;  Service: Cardiovascular;  Laterality: Left;  . UNILATERAL UPPER EXTREMEITY ANGIOGRAM N/A 07/12/2014   Procedure: UNILATERAL UPPER Anselmo Rod;  Surgeon: Angelia Mould, MD;  Location: [facility] CATH LAB;  Service: Cardiovascular;  Laterality: N/A;    There were no vitals filed for this visit.      Subjective Assessment - 09/14/16 1018    Subjective "I need another one of those hand stretchers."   Pertinent History * BP on L forearm only.   On dialysis M, W, F, peripheral artery disease, myalgia, diabetes, chronic kidney disease   Patient Stated Goals Pt's goal for therapy would be to get strength in arms and legs.     Currently in Pain? No/denies             Today's Session: Reviewed all of HEP with min cues for technique-see handouts for details.  Pt overall independent needing cues to slow down.  Educated on  silver sneakers and fitness centers close to pt.                    PT Education - 19-Sep-2016 1049    Education provided Yes   Education Details community fitness   Person(s) Educated Patient   Methods Explanation;Handout   Comprehension Verbalized understanding             PT Long Term Goals - 09-19-16 1049      PT LONG TERM GOAL #1   Title Pt will be independent with HEP for imrpoved balance, strength and gait.  TARGET 08/11/16   Status Achieved     PT LONG TERM GOAL #2   Title Pt will improve TUG score to less than or equal to 13.5 seconds for decreased fall risk.   Status Achieved     PT LONG TERM GOAL #3   Title Pt will  improve Dynamic Gait Index score to at least 20/24 for decreased fall risk.   Status Achieved     PT LONG TERM GOAL #4   Title Pt will improve 5x sit<>stand transfers to less than or equal to 16 seconds for improved efficiency and safety with transfers.   Status Achieved     PT LONG TERM GOAL #5   Title Pt will ambulate at least 1000 ft indoor and outdoor surfaces, independently/no LOB for improved outdoor and community gait.   Status Achieved               Plan - 09/19/2016 1049    Clinical Impression Statement Pt has met all goals and is ready for d/c.   PT Treatment/Interventions ADLs/Self Care Home Management;Functional mobility training;Gait training;Therapeutic activities;Therapeutic exercise;Balance training;Neuromuscular re-education;Patient/family education   PT Next Visit Plan d/c PT today   Consulted and Agree with Plan of Care Patient      Patient will benefit from skilled therapeutic intervention in order to improve the following deficits and impairments:  Abnormal gait, Decreased balance, Decreased strength, Decreased mobility, Difficulty walking  Visit Diagnosis: Unsteadiness on feet - Plan: PT plan of care cert/re-cert  Other abnormalities of gait and mobility - Plan: PT plan of care cert/re-cert  Muscle weakness (generalized) - Plan: PT plan of care cert/re-cert       G-Codes - September 19, 2016 1051    Functional Assessment Tool Used clinical judgement: all goals met   Functional Limitation Mobility: Walking and moving around   Mobility: Walking and Moving Around Current Status (T7001) At least 1 percent but less than 20 percent impaired, limited or restricted   Mobility: Walking and Moving Around Goal Status [phone]) At least 1 percent but less than 20 percent impaired, limited or restricted      Problem List Patient Active Problem List   Diagnosis Date Noted  . Dyspnea   . Macrocytic anemia 06/04/2016  . Thrombocytopenia ([place]) 06/04/2016  . Exertional  dyspnea 06/04/2016  . Arm paresthesia, left 06/04/2016  . Dyspnea on exertion 06/04/2016  . Tenderness of right calf 06/04/2016  . Mild cognitive impairment with memory loss 01/12/2016  . ESRD on dialysis ([place]) 05/17/2015  . Hypoglycemia   . Weakness 02/18/2015  . OSA (obstructive sleep apnea) 02/02/2015  . Hyperkalemia 10/05/2014  . Chronic kidney disease (CKD), stage IV (severe) ([place]) 04/07/2014  . End stage renal disease ([place]) 11/18/2013  . BPH (benign prostatic hyperplasia) 11/22/2012  . Peripheral vascular disease ([place]) 12/24/2011  . Hyperparathyroidism ([place]) 12/24/2011  . Hypothyroidism 12/24/2011  . Allergic rhinitis 12/24/2011  .  Erectile dysfunction 12/24/2011  . Insulin dependent diabetes mellitus ([place]) 09/03/2008  . Hyperlipidemia 09/03/2008  . Essential hypertension 08/30/2008  . PANCREATITIS, HX OF 08/30/2008  . RENAL FAILURE, ACUTE, HX OF 08/30/2008  . DIVERTICULOSIS, COLON 06/08/2003      Laureen Abrahams, PT, DPT 09/14/16 10:54 AM    [facility] [address] [place], [place], [address] Phone: [phone]   Fax:  [phone]  Name: [name] MRN: [id number] Date of Birth: May 31, 1941         PHYSICAL THERAPY DISCHARGE SUMMARY  Visits from Start of Care: 9  Current functional level related to goals / functional outcomes: See above   Remaining deficits: N/a; pt needs to continue with community and home program but seems to lack self motivation to continue   Education / Equipment: HEP, community fitness  Plan: Patient agrees to discharge.  Patient goals were met. Patient is being discharged due to meeting the stated rehab goals.  ?????     Laureen Abrahams, PT, DPT 09/14/16 10:54 AM  [facility] [address] [place], [place] [address]  [phone] (office) [phone] (fax)

## 2016-09-14 NOTE — Patient Instructions (Signed)
   [facility] Address: [place], [place] [address]   Phone: [phone]  Website: [email or web address]   [facility]  Address:  [place], [place] [address]   Phone:  [phone]  Website:  [email or web address]

## 2016-09-16 DIAGNOSIS — N2581 Secondary hyperparathyroidism of renal origin: Secondary | ICD-10-CM | POA: Diagnosis not present

## 2016-09-16 DIAGNOSIS — N186 End stage renal disease: Secondary | ICD-10-CM | POA: Diagnosis not present

## 2016-09-16 DIAGNOSIS — E1129 Type 2 diabetes mellitus with other diabetic kidney complication: Secondary | ICD-10-CM | POA: Diagnosis not present

## 2016-09-16 DIAGNOSIS — D509 Iron deficiency anemia, unspecified: Secondary | ICD-10-CM | POA: Diagnosis not present

## 2016-09-18 ENCOUNTER — Ambulatory Visit: Payer: Medicare Other | Admitting: Internal Medicine

## 2016-09-19 DIAGNOSIS — N2581 Secondary hyperparathyroidism of renal origin: Secondary | ICD-10-CM | POA: Diagnosis not present

## 2016-09-19 DIAGNOSIS — E1129 Type 2 diabetes mellitus with other diabetic kidney complication: Secondary | ICD-10-CM | POA: Diagnosis not present

## 2016-09-19 DIAGNOSIS — D509 Iron deficiency anemia, unspecified: Secondary | ICD-10-CM | POA: Diagnosis not present

## 2016-09-19 DIAGNOSIS — N186 End stage renal disease: Secondary | ICD-10-CM | POA: Diagnosis not present

## 2016-09-20 ENCOUNTER — Encounter: Payer: Self-pay | Admitting: Internal Medicine

## 2016-09-20 ENCOUNTER — Ambulatory Visit (INDEPENDENT_AMBULATORY_CARE_PROVIDER_SITE_OTHER): Payer: Medicare Other | Admitting: Internal Medicine

## 2016-09-20 VITALS — BP 128/80 | HR 63 | Temp 97.8°F | Ht 71.0 in | Wt 200.0 lb

## 2016-09-20 DIAGNOSIS — Z5189 Encounter for other specified aftercare: Secondary | ICD-10-CM | POA: Diagnosis not present

## 2016-09-20 NOTE — Patient Instructions (Signed)
Try Tegaderm every other day. Clean with peroxide when Tegaderm is changed. Return in 2 weeks.

## 2016-09-20 NOTE — Progress Notes (Signed)
   Subjective:    Patient ID: [name], male    DOB: [birth year], 75 y.o.   MRN: [id number]  HPI He has skin breakdown in the left periumbilical area that measures approximately 1 cm wide by 3 cm long. It looks clean and dry and it seems that the edges are beginning to heal and although slowly. He's been applying Bactroban ointment and cleaning it with peroxide daily. He covers it with a regular gauze.    Review of Systems     Objective:   Physical Exam  Lesion described above with size and borders. No evidence of secondary infection      Assessment & Plan:  Superficial skin wound in left. Umbilical area  Plan: We will try Tegaderm. I gave him some samples and he'll change it every other day and return in 2 weeks. Ideally you can leave it on for 7 days but think it would be best if he changes it every other day. He'll still clean it with peroxide but not apply Bactroban.

## 2016-09-21 DIAGNOSIS — N186 End stage renal disease: Secondary | ICD-10-CM | POA: Diagnosis not present

## 2016-09-21 DIAGNOSIS — N2581 Secondary hyperparathyroidism of renal origin: Secondary | ICD-10-CM | POA: Diagnosis not present

## 2016-09-21 DIAGNOSIS — D509 Iron deficiency anemia, unspecified: Secondary | ICD-10-CM | POA: Diagnosis not present

## 2016-09-21 DIAGNOSIS — E1129 Type 2 diabetes mellitus with other diabetic kidney complication: Secondary | ICD-10-CM | POA: Diagnosis not present

## 2016-09-23 DIAGNOSIS — N2581 Secondary hyperparathyroidism of renal origin: Secondary | ICD-10-CM | POA: Diagnosis not present

## 2016-09-23 DIAGNOSIS — E1129 Type 2 diabetes mellitus with other diabetic kidney complication: Secondary | ICD-10-CM | POA: Diagnosis not present

## 2016-09-23 DIAGNOSIS — Z992 Dependence on renal dialysis: Secondary | ICD-10-CM | POA: Diagnosis not present

## 2016-09-23 DIAGNOSIS — D509 Iron deficiency anemia, unspecified: Secondary | ICD-10-CM | POA: Diagnosis not present

## 2016-09-23 DIAGNOSIS — N186 End stage renal disease: Secondary | ICD-10-CM | POA: Diagnosis not present

## 2016-09-23 DIAGNOSIS — E1122 Type 2 diabetes mellitus with diabetic chronic kidney disease: Secondary | ICD-10-CM | POA: Diagnosis not present

## 2016-09-26 DIAGNOSIS — D509 Iron deficiency anemia, unspecified: Secondary | ICD-10-CM | POA: Diagnosis not present

## 2016-09-26 DIAGNOSIS — N2581 Secondary hyperparathyroidism of renal origin: Secondary | ICD-10-CM | POA: Diagnosis not present

## 2016-09-26 DIAGNOSIS — N186 End stage renal disease: Secondary | ICD-10-CM | POA: Diagnosis not present

## 2016-09-26 DIAGNOSIS — E1129 Type 2 diabetes mellitus with other diabetic kidney complication: Secondary | ICD-10-CM | POA: Diagnosis not present

## 2016-09-26 DIAGNOSIS — Z23 Encounter for immunization: Secondary | ICD-10-CM | POA: Diagnosis not present

## 2016-09-26 DIAGNOSIS — D631 Anemia in chronic kidney disease: Secondary | ICD-10-CM | POA: Diagnosis not present

## 2016-09-28 DIAGNOSIS — D631 Anemia in chronic kidney disease: Secondary | ICD-10-CM | POA: Diagnosis not present

## 2016-09-28 DIAGNOSIS — D509 Iron deficiency anemia, unspecified: Secondary | ICD-10-CM | POA: Diagnosis not present

## 2016-09-28 DIAGNOSIS — Z23 Encounter for immunization: Secondary | ICD-10-CM | POA: Diagnosis not present

## 2016-09-28 DIAGNOSIS — E1129 Type 2 diabetes mellitus with other diabetic kidney complication: Secondary | ICD-10-CM | POA: Diagnosis not present

## 2016-09-28 DIAGNOSIS — N186 End stage renal disease: Secondary | ICD-10-CM | POA: Diagnosis not present

## 2016-09-28 DIAGNOSIS — N2581 Secondary hyperparathyroidism of renal origin: Secondary | ICD-10-CM | POA: Diagnosis not present

## 2016-10-01 DIAGNOSIS — Z23 Encounter for immunization: Secondary | ICD-10-CM | POA: Diagnosis not present

## 2016-10-01 DIAGNOSIS — N2581 Secondary hyperparathyroidism of renal origin: Secondary | ICD-10-CM | POA: Diagnosis not present

## 2016-10-01 DIAGNOSIS — N186 End stage renal disease: Secondary | ICD-10-CM | POA: Diagnosis not present

## 2016-10-01 DIAGNOSIS — E1129 Type 2 diabetes mellitus with other diabetic kidney complication: Secondary | ICD-10-CM | POA: Diagnosis not present

## 2016-10-01 DIAGNOSIS — D509 Iron deficiency anemia, unspecified: Secondary | ICD-10-CM | POA: Diagnosis not present

## 2016-10-01 DIAGNOSIS — D631 Anemia in chronic kidney disease: Secondary | ICD-10-CM | POA: Diagnosis not present

## 2016-10-03 DIAGNOSIS — E1129 Type 2 diabetes mellitus with other diabetic kidney complication: Secondary | ICD-10-CM | POA: Diagnosis not present

## 2016-10-03 DIAGNOSIS — N2581 Secondary hyperparathyroidism of renal origin: Secondary | ICD-10-CM | POA: Diagnosis not present

## 2016-10-03 DIAGNOSIS — N186 End stage renal disease: Secondary | ICD-10-CM | POA: Diagnosis not present

## 2016-10-03 DIAGNOSIS — D509 Iron deficiency anemia, unspecified: Secondary | ICD-10-CM | POA: Diagnosis not present

## 2016-10-03 DIAGNOSIS — Z23 Encounter for immunization: Secondary | ICD-10-CM | POA: Diagnosis not present

## 2016-10-03 DIAGNOSIS — D631 Anemia in chronic kidney disease: Secondary | ICD-10-CM | POA: Diagnosis not present

## 2016-10-04 ENCOUNTER — Encounter: Payer: Self-pay | Admitting: Internal Medicine

## 2016-10-04 ENCOUNTER — Ambulatory Visit (INDEPENDENT_AMBULATORY_CARE_PROVIDER_SITE_OTHER): Payer: Medicare Other | Admitting: Internal Medicine

## 2016-10-04 VITALS — BP 102/60 | HR 74 | Temp 98.1°F | Ht 71.0 in | Wt 198.0 lb

## 2016-10-04 DIAGNOSIS — Z5189 Encounter for other specified aftercare: Secondary | ICD-10-CM | POA: Diagnosis not present

## 2016-10-04 NOTE — Patient Instructions (Addendum)
Referred to [place] in [facility]. Has appointment on Monday, January 15

## 2016-10-04 NOTE — Progress Notes (Signed)
   Subjective:    Patient ID: [name], male    DOB: 03/12/1941, 76 y.o.   MRN: [id number]  HPI  At last visit I gave him some Tegaderm to use on skin breakdown in the para umbilical area. He is here today for follow-up.    Review of Systems     Objective:   Physical Exam  The wound looks clean but I don't see a lot of healing. I'm concerned about this. He also has a skin opening on the right side of the umbilicus in the lower quadrant. This is not draining. I did not notice this before. He has multiple surgical scars from a previous operation years ago.      Assessment & Plan:  Skin wound  Plan: He's being referred to the wound care center at [facility]. He lives in [place] area and that should be more convenient for him.

## 2016-10-05 DIAGNOSIS — D509 Iron deficiency anemia, unspecified: Secondary | ICD-10-CM | POA: Diagnosis not present

## 2016-10-05 DIAGNOSIS — N186 End stage renal disease: Secondary | ICD-10-CM | POA: Diagnosis not present

## 2016-10-05 DIAGNOSIS — D631 Anemia in chronic kidney disease: Secondary | ICD-10-CM | POA: Diagnosis not present

## 2016-10-05 DIAGNOSIS — Z23 Encounter for immunization: Secondary | ICD-10-CM | POA: Diagnosis not present

## 2016-10-05 DIAGNOSIS — N2581 Secondary hyperparathyroidism of renal origin: Secondary | ICD-10-CM | POA: Diagnosis not present

## 2016-10-05 DIAGNOSIS — E1129 Type 2 diabetes mellitus with other diabetic kidney complication: Secondary | ICD-10-CM | POA: Diagnosis not present

## 2016-10-08 ENCOUNTER — Other Ambulatory Visit (INDEPENDENT_AMBULATORY_CARE_PROVIDER_SITE_OTHER): Payer: Medicare Other

## 2016-10-08 ENCOUNTER — Encounter: Payer: Medicare Other | Attending: Surgery | Admitting: Surgery

## 2016-10-08 DIAGNOSIS — Z794 Long term (current) use of insulin: Secondary | ICD-10-CM

## 2016-10-08 DIAGNOSIS — E1140 Type 2 diabetes mellitus with diabetic neuropathy, unspecified: Secondary | ICD-10-CM | POA: Diagnosis not present

## 2016-10-08 DIAGNOSIS — Z87891 Personal history of nicotine dependence: Secondary | ICD-10-CM | POA: Diagnosis not present

## 2016-10-08 DIAGNOSIS — Z992 Dependence on renal dialysis: Secondary | ICD-10-CM | POA: Diagnosis not present

## 2016-10-08 DIAGNOSIS — X58XXXA Exposure to other specified factors, initial encounter: Secondary | ICD-10-CM | POA: Diagnosis not present

## 2016-10-08 DIAGNOSIS — E1129 Type 2 diabetes mellitus with other diabetic kidney complication: Secondary | ICD-10-CM | POA: Diagnosis not present

## 2016-10-08 DIAGNOSIS — M109 Gout, unspecified: Secondary | ICD-10-CM | POA: Diagnosis not present

## 2016-10-08 DIAGNOSIS — E1165 Type 2 diabetes mellitus with hyperglycemia: Secondary | ICD-10-CM

## 2016-10-08 DIAGNOSIS — E11622 Type 2 diabetes mellitus with other skin ulcer: Secondary | ICD-10-CM | POA: Insufficient documentation

## 2016-10-08 DIAGNOSIS — I120 Hypertensive chronic kidney disease with stage 5 chronic kidney disease or end stage renal disease: Secondary | ICD-10-CM | POA: Diagnosis not present

## 2016-10-08 DIAGNOSIS — D631 Anemia in chronic kidney disease: Secondary | ICD-10-CM | POA: Diagnosis not present

## 2016-10-08 DIAGNOSIS — S31102A Unspecified open wound of abdominal wall, epigastric region without penetration into peritoneal cavity, initial encounter: Secondary | ICD-10-CM | POA: Insufficient documentation

## 2016-10-08 DIAGNOSIS — E785 Hyperlipidemia, unspecified: Secondary | ICD-10-CM | POA: Diagnosis not present

## 2016-10-08 DIAGNOSIS — L98491 Non-pressure chronic ulcer of skin of other sites limited to breakdown of skin: Secondary | ICD-10-CM | POA: Diagnosis not present

## 2016-10-08 DIAGNOSIS — D509 Iron deficiency anemia, unspecified: Secondary | ICD-10-CM | POA: Diagnosis not present

## 2016-10-08 DIAGNOSIS — N186 End stage renal disease: Secondary | ICD-10-CM | POA: Diagnosis not present

## 2016-10-08 DIAGNOSIS — E1122 Type 2 diabetes mellitus with diabetic chronic kidney disease: Secondary | ICD-10-CM | POA: Diagnosis not present

## 2016-10-08 DIAGNOSIS — Z23 Encounter for immunization: Secondary | ICD-10-CM | POA: Diagnosis not present

## 2016-10-08 DIAGNOSIS — N2581 Secondary hyperparathyroidism of renal origin: Secondary | ICD-10-CM | POA: Diagnosis not present

## 2016-10-08 DIAGNOSIS — Z880 Allergy status to penicillin: Secondary | ICD-10-CM | POA: Insufficient documentation

## 2016-10-08 DIAGNOSIS — D649 Anemia, unspecified: Secondary | ICD-10-CM | POA: Insufficient documentation

## 2016-10-08 LAB — GLUCOSE, RANDOM: Glucose, Bld: 59 mg/dL — ABNORMAL LOW (ref 70–99)

## 2016-10-08 NOTE — Progress Notes (Signed)
JAICEON, COLLISTER ([id number]) Visit Report for 10/08/2016 Abuse/Suicide Risk Screen Details Patient Name: [name], [name]. Date of Service: 10/08/2016 8:45 AM Medical Record Number: [id number] Patient Account Number: [id number] Date of Birth/Sex: [birth year] (76 y.o. Male) Treating RN: Montey Hora Primary Care Physician: Tedra Senegal Other Clinician: Referring Physician: Tedra Senegal Treating Physician/Extender: Frann Rider in Treatment: 0 Abuse/Suicide Risk Screen Items Answer ABUSE/SUICIDE RISK SCREEN: Has anyone close to you tried to hurt or harm you recentlyo No Do you feel uncomfortable with anyone in your familyo No Has anyone forced you do things that you didnot want to doo No Do you have any thoughts of harming yourselfo No Patient displays signs or symptoms of abuse and/or neglect. No Electronic Signature(s) Signed: 10/08/2016 3:52:38 PM By: Montey Hora Entered By: Montey Hora on 10/08/2016 09:04:16 Robley, Talmage [name] ([id number]) -------------------------------------------------------------------------------- Activities of Daily Living Details Patient Name: [name]. Date of Service: 10/08/2016 8:45 AM Medical Record Number: [id number] Patient Account Number: [id number] Date of Birth/Sex: 19-Jun-1941 (76 y.o. Male) Treating RN: Montey Hora Primary Care Physician: Tedra Senegal Other Clinician: Referring Physician: Tedra Senegal Treating Physician/Extender: Frann Rider in Treatment: 0 Activities of Daily Living Items Answer Activities of Daily Living (Please select one for each item) Drive Automobile Completely Able Take Medications Completely Able Use Telephone Completely Able Care for Appearance Completely Able Use Toilet Completely Able Bath / Shower Completely Able Dress Self Completely Able Feed Self Completely Able Walk Completely Able Get In / Out Bed Completely Able Housework Completely Able Prepare Meals Completely Maud for Self Completely Able Electronic Signature(s) Signed: 10/08/2016 3:52:38 PM By: Montey Hora Entered By: Montey Hora on 10/08/2016 09:04:56 Aller, Talmage [name] ([id number]) -------------------------------------------------------------------------------- Education Assessment Details Patient Name: [name]. Date of Service: 10/08/2016 8:45 AM Medical Record Number: [id number] Patient Account Number: [id number] Date of Birth/Sex: 1941-01-08 (76 y.o. Male) Treating RN: Montey Hora Primary Care Physician: Tedra Senegal Other Clinician: Referring Physician: Tedra Senegal Treating Physician/Extender: Frann Rider in Treatment: 0 Primary Learner Assessed: Patient Learning Preferences/Education Level/Primary Language Learning Preference: Explanation, Demonstration Highest Education Level: College or Above Preferred Language: English Cognitive Barrier Assessment/Beliefs Language Barrier: No Translator Needed: No Memory Deficit: No Emotional Barrier: No Cultural/Religious Beliefs Affecting Medical No Care: Physical Barrier Assessment Impaired Vision: No Impaired Hearing: No Decreased Hand dexterity: No Knowledge/Comprehension Assessment Knowledge Level: Medium Comprehension Level: Medium Ability to understand written Medium instructions: Ability to understand verbal Medium instructions: Motivation Assessment Anxiety Level: Calm Cooperation: Cooperative Education Importance: Acknowledges Need Interest in Health Problems: Asks Questions Perception: Coherent Willingness to Engage in Self- Medium Management Activities: Readiness to Engage in Self- Medium Management Activities: Electronic Signature(s) DAUNTAE, DERUSHA ([id number]) Signed: 10/08/2016 3:52:38 PM By: Montey Hora Entered By: Montey Hora on 10/08/2016 09:05:18 Schrodt, Talmage [name]  ([id number]) -------------------------------------------------------------------------------- Fall Risk Assessment Details Patient Name: [name]. Date of Service: 10/08/2016 8:45 AM Medical Record Number: [id number] Patient Account Number: [id number] Date of Birth/Sex: 11-Apr-1941 (76 y.o. Male) Treating RN: Montey Hora Primary Care Physician: Tedra Senegal Other Clinician: Referring Physician: Tedra Senegal Treating Physician/Extender: Frann Rider in Treatment: 0 Fall Risk Assessment Items Have you had 2 or more falls in the last 12 monthso 0 Yes Have you had any fall that resulted in injury in the last 12 monthso 0 No FALL RISK ASSESSMENT: History of falling - immediate or within 3 months 25 Yes Secondary diagnosis 0 No Ambulatory aid None/bed rest/wheelchair/nurse 0 Yes Crutches/cane/walker 0 No Furniture  0 No IV Access/Saline Lock 0 No Gait/Training Normal/bed rest/immobile 0 Yes Weak 0 No Impaired 0 No Mental Status Oriented to own ability 0 Yes Electronic Signature(s) Signed: 10/08/2016 3:52:38 PM By: Montey Hora Entered By: Montey Hora on 10/08/2016 09:05:43 Alleman, Talmage [name] ([id number]) -------------------------------------------------------------------------------- Nutrition Risk Assessment Details Patient Name: [name]. Date of Service: 10/08/2016 8:45 AM Medical Record Number: [id number] Patient Account Number: [id number] Date of Birth/Sex: Jul 07, 1941 (76 y.o. Male) Treating RN: Montey Hora Primary Care Physician: Tedra Senegal Other Clinician: Referring Physician: Tedra Senegal Treating Physician/Extender: Frann Rider in Treatment: 0 Height (in): 71 Weight (lbs): 203 Body Mass Index (BMI): 28.3 Nutrition Risk Assessment Items NUTRITION RISK SCREEN: I have an illness or condition that made me change the kind and/or 0 No amount of food I eat I eat fewer than two meals per day 0 No I eat few fruits and vegetables, or milk  products 0 No I have three or more drinks of beer, liquor or wine almost every day 0 No I have tooth or mouth problems that make it hard for me to eat 0 No I don't always have enough money to buy the food I need 0 No I eat alone most of the time 0 No I take three or more different prescribed or over-the-counter drugs a 1 Yes day Without wanting to, I have lost or gained 10 pounds in the last six 0 No months I am not always physically able to shop, cook and/or feed myself 0 No Nutrition Protocols Good Risk Protocol Moderate Risk Protocol Electronic Signature(s) Signed: 10/08/2016 3:52:38 PM By: Montey Hora Entered By: Montey Hora on 10/08/2016 09:05:48

## 2016-10-08 NOTE — Progress Notes (Signed)
[name], [name] ([id number]) Visit Report for 10/08/2016 Allergy List Details Patient Name: [name], [name]. Date of Service: 10/08/2016 8:45 AM Medical Record Number: [id number] Patient Account Number: [id number] Date of Birth/Sex: 01/10/1941 (76 y.o. Male) Treating RN: Montey Hora Primary Care Physician: Tedra Senegal Other Clinician: Referring Physician: Tedra Senegal Treating Physician/Extender: Frann Rider in Treatment: 0 Allergies Active Allergies penicillin Allergy Notes Electronic Signature(s) Signed: 10/08/2016 3:52:38 PM By: Montey Hora Entered By: Montey Hora on 10/08/2016 09:04:03 [name] ([id number]) -------------------------------------------------------------------------------- Arrival Information Details Patient Name: [name]. Date of Service: 10/08/2016 8:45 AM Medical Record Number: [id number] Patient Account Number: [id number] Date of Birth/Sex: 1941-03-20 (76 y.o. Male) Treating RN: Montey Hora Primary Care Physician: Tedra Senegal Other Clinician: Referring Physician: Tedra Senegal Treating Physician/Extender: Frann Rider in Treatment: 0 Visit Information Patient Arrived: Ambulatory Arrival Time: 09:00 Accompanied By: self Transfer Assistance: None Patient Identification Verified: Yes Secondary Verification Process Yes Completed: Patient Has Alerts: Yes Patient Alerts: DMII aspirin 650 Electronic Signature(s) Signed: 10/08/2016 3:52:38 PM By: Montey Hora Entered By: Montey Hora on 10/08/2016 09:00:40 [name] ([id number]) -------------------------------------------------------------------------------- Clinic Level of Care Assessment Details Patient Name: [name]. Date of Service: 10/08/2016 8:45 AM Medical Record Number: [id number] Patient Account Number: [id number] Date of Birth/Sex: October 30, 1940 (76 y.o. Male) Treating RN: Montey Hora Primary Care Physician: Tedra Senegal Other  Clinician: Referring Physician: Tedra Senegal Treating Physician/Extender: Frann Rider in Treatment: 0 Clinic Level of Care Assessment Items TOOL 2 Quantity Score []  - Use when only an EandM is performed on the INITIAL visit 0 ASSESSMENTS - Nursing Assessment / Reassessment X - General Physical Exam (combine w/ comprehensive assessment (listed just 1 20 below) when performed on new pt. evals) X - Comprehensive Assessment (HX, ROS, Risk Assessments, Wounds Hx, etc.) 1 25 ASSESSMENTS - Wound and Skin Assessment / Reassessment X - Simple Wound Assessment / Reassessment - one wound 1 5 []  - Complex Wound Assessment / Reassessment - multiple wounds 0 []  - Dermatologic / Skin Assessment (not related to wound area) 0 ASSESSMENTS - Ostomy and/or Continence Assessment and Care []  - Incontinence Assessment and Management 0 []  - Ostomy Care Assessment and Management (repouching, etc.) 0 PROCESS - Coordination of Care X - Simple Patient / Family Education for ongoing care 1 15 []  - Complex (extensive) Patient / Family Education for ongoing care 0 X - Staff obtains Programmer, systems, Records, Test Results / Process Orders 1 10 []  - Staff telephones HHA, Nursing Homes / Clarify orders / etc 0 []  - Routine Transfer to another Facility (non-emergent condition) 0 []  - Routine Hospital Admission (non-emergent condition) 0 []  - New Admissions / Biomedical engineer / Ordering NPWT, Apligraf, etc. 0 []  - Emergency Hospital Admission (emergent condition) 0 X - Simple Discharge Coordination 1 10 [name] ([id number]) []  - Complex (extensive) Discharge Coordination 0 PROCESS - Special Needs []  - Pediatric / Minor Patient Management 0 []  - Isolation Patient Management 0 []  - Hearing / Language / Visual special needs 0 []  - Assessment of Community assistance (transportation, D/C planning, etc.) 0 []  - Additional assistance / Altered mentation 0 []  - Support Surface(s) Assessment (bed, cushion,  seat, etc.) 0 INTERVENTIONS - Wound Cleansing / Measurement X - Wound Imaging (photographs - any number of wounds) 1 5 []  - Wound Tracing (instead of photographs) 0 X - Simple Wound Measurement - one wound 1 5 []  - Complex Wound Measurement - multiple wounds 0 X - Simple Wound Cleansing -  one wound 1 5 []  - Complex Wound Cleansing - multiple wounds 0 INTERVENTIONS - Wound Dressings X - Small Wound Dressing one or multiple wounds 1 10 []  - Medium Wound Dressing one or multiple wounds 0 []  - Large Wound Dressing one or multiple wounds 0 []  - Application of Medications - injection 0 INTERVENTIONS - Miscellaneous []  - External ear exam 0 []  - Specimen Collection (cultures, biopsies, blood, body fluids, etc.) 0 []  - Specimen(s) / Culture(s) sent or taken to Lab for analysis 0 []  - Patient Transfer (multiple staff / Harrel Lemon Lift / Similar devices) 0 []  - Simple Staple / Suture removal (25 or less) 0 []  - Complex Staple / Suture removal (26 or more) 0 [name] ([id number]) []  - Hypo / Hyperglycemic Management (close monitor of Blood Glucose) 0 []  - Ankle / Brachial Index (ABI) - do not check if billed separately 0 Has the patient been seen at the hospital within the last three years: Yes Total Score: 110 Level Of Care: New/Established - Level 3 Electronic Signature(s) Signed: 10/08/2016 3:52:38 PM By: Montey Hora Entered By: Montey Hora on 10/08/2016 09:28:01 [name] ([id number]) -------------------------------------------------------------------------------- Encounter Discharge Information Details Patient Name: [name]. Date of Service: 10/08/2016 8:45 AM Medical Record Number: [id number] Patient Account Number: [id number] Date of Birth/Sex: Oct 22, 1940 (76 y.o. Male) Treating RN: Montey Hora Primary Care Physician: Tedra Senegal Other Clinician: Referring Physician: Tedra Senegal Treating Physician/Extender: Frann Rider in Treatment: 0 Encounter  Discharge Information Items Discharge Pain Level: 0 Discharge Condition: Stable Ambulatory Status: Ambulatory Discharge Destination: Home Transportation: Private Auto Accompanied By: self Schedule Follow-up Appointment: Yes Medication Reconciliation completed and provided to Patient/Care No Anjeanette Petzold: Provided on Clinical Summary of Care: 10/08/2016 Form Type Recipient Paper Patient WT Electronic Signature(s) Signed: 10/08/2016 9:54:13 AM By: Sharon Mt Entered By: Sharon Mt on 10/08/2016 09:54:13 Males, [name] ([id number]) -------------------------------------------------------------------------------- Multi Wound Chart Details Patient Name: [name]. Date of Service: 10/08/2016 8:45 AM Medical Record Number: [id number] Patient Account Number: [id number] Date of Birth/Sex: 1941-01-02 (76 y.o. Male) Treating RN: Montey Hora Primary Care Physician: Tedra Senegal Other Clinician: Referring Physician: Tedra Senegal Treating Physician/Extender: Frann Rider in Treatment: 0 Vital Signs Height(in): 71 Pulse(bpm): 45 Weight(lbs): 203 Blood Pressure 130/46 (mmHg): Body Mass Index(BMI): 28 Temperature(F): Respiratory Rate 16 (breaths/min): Photos: [N/A:N/A] Wound Location: Abdomen - midline N/A N/A Wounding Event: Gradually Appeared N/A N/A Primary Etiology: To be determined N/A N/A Comorbid History: Anemia, Hypertension, N/A N/A Peripheral Venous Disease, Type II Diabetes, Gout, Neuropathy Date Acquired: 09/24/2016 N/A N/A Weeks of Treatment: 0 N/A N/A Wound Status: Open N/A N/A Measurements L x W x D 0.6x0.4x0.1 N/A N/A (cm) Area (cm) : 0.188 N/A N/A Volume (cm) : 0.019 N/A N/A Classification: Partial Thickness N/A N/A Exudate Amount: Large N/A N/A Exudate Type: Serous N/A N/A Exudate Color: amber N/A N/A Wound Margin: Flat and Intact N/A N/A Granulation Amount: Small (1-33%) N/A N/A Granulation Quality: Pink N/A N/A Necrotic Amount: Large  (67-100%) N/A N/A Exposed Structures: N/A N/A Bothwell, [name] ([id number]) Fascia: No Fat: No Tendon: No Muscle: No Joint: No Bone: No Limited to Skin Breakdown Epithelialization: Small (1-33%) N/A N/A Periwound Skin Texture: Edema: No N/A N/A Excoriation: No Induration: No Callus: No Crepitus: No Fluctuance: No Friable: No Rash: No Scarring: No Periwound Skin Moist: Yes N/A N/A Moisture: Maceration: No Dry/Scaly: No Periwound Skin Color: Atrophie Blanche: No N/A N/A Cyanosis: No Ecchymosis: No Erythema: No Hemosiderin Staining: No Mottled: No Pallor:  No Rubor: No Temperature: No Abnormality N/A N/A Tenderness on No N/A N/A Palpation: Wound Preparation: Ulcer Cleansing: N/A N/A Rinsed/Irrigated with Saline Topical Anesthetic Applied: Other: lidocaine 4% Treatment Notes Wound #1 (Abdomen - midline) 1. Cleansed with: Clean wound with Normal Saline 2. Anesthetic Topical Lidocaine 4% cream to wound bed prior to debridement 4. Dressing Applied: Prisma Ag 5. Secondary Dressing Applied DECOREY, WAHLERT ([id number]) [place] Signature(s) Signed: 10/08/2016 9:43:02 AM By: Christin Fudge MD, FACS Entered By: Christin Fudge on 10/08/2016 09:43:02 Achenbach, [name] ([id number]) -------------------------------------------------------------------------------- [place] Details Patient Name: [name]. Date of Service: 10/08/2016 8:45 AM Medical Record Number: [id number] Patient Account Number: [id number] Date of Birth/Sex: [birth year] (76 y.o. Male) Treating RN: Montey Hora Primary Care Physician: Tedra Senegal Other Clinician: Referring Physician: Tedra Senegal Treating Physician/Extender: Frann Rider in Treatment: 0 Active Inactive Abuse / Safety / Falls / Self Care Management Nursing Diagnoses: Potential for falls Goals: Patient will remain injury free Date Initiated: 10/08/2016 Goal Status:  Active Interventions: Assess fall risk on admission and as needed Notes: Orientation to the Wound Care Program Nursing Diagnoses: Knowledge deficit related to the wound healing center program Goals: Patient/caregiver will verbalize understanding of the [facility] Program Date Initiated: 10/08/2016 Goal Status: Active Interventions: Provide education on orientation to the wound center Notes: Wound/Skin Impairment Nursing Diagnoses: Impaired tissue integrity Goals: Patient/caregiver will verbalize understanding of skin care regimen Date Initiated: 10/08/2016 Ibach, [name] ([id number]) Goal Status: Active Ulcer/skin breakdown will have a volume reduction of 30% by week 4 Date Initiated: 10/08/2016 Goal Status: Active Ulcer/skin breakdown will have a volume reduction of 50% by week 8 Date Initiated: 10/08/2016 Goal Status: Active Ulcer/skin breakdown will have a volume reduction of 80% by week 12 Date Initiated: 10/08/2016 Goal Status: Active Ulcer/skin breakdown will heal within 14 weeks Date Initiated: 10/08/2016 Goal Status: Active Interventions: Assess patient/caregiver ability to obtain necessary supplies Assess patient/caregiver ability to perform ulcer/skin care regimen upon admission and as needed Assess ulceration(s) every visit Notes: Electronic Signature(s) Signed: 10/08/2016 3:52:38 PM By: Montey Hora Entered By: Montey Hora on 10/08/2016 09:26:18 Nunziata, [name] ([id number]) -------------------------------------------------------------------------------- Pain Assessment Details Patient Name: [name]. Date of Service: 10/08/2016 8:45 AM Medical Record Number: [id number] Patient Account Number: [id number] Date of Birth/Sex: 1941-06-14 (76 y.o. Male) Treating RN: Montey Hora Primary Care Physician: Tedra Senegal Other Clinician: Referring Physician: Tedra Senegal Treating Physician/Extender: Frann Rider in Treatment: 0 Active  Problems Location of Pain Severity and Description of Pain Patient Has Paino No Site Locations Pain Management and Medication Current Pain Management: Notes Topical or injectable lidocaine is offered to patient for acute pain when surgical debridement is performed. If needed, Patient is instructed to use over the counter pain medication for the following 24-48 hours after debridement. Wound care MDs do not prescribed pain medications. Patient has chronic pain or uncontrolled pain. Patient has been instructed to make an appointment with their Primary Care Physician for pain management. Electronic Signature(s) Signed: 10/08/2016 3:52:38 PM By: Montey Hora Entered By: Montey Hora on 10/08/2016 09:00:51 Wuellner, [name] ([id number]) -------------------------------------------------------------------------------- Patient/Caregiver Education Details Patient Name: [name]. Date of Service: 10/08/2016 8:45 AM Medical Record Number: [id number] Patient Account Number: [id number] Date of Birth/Gender: September 10, 1941 (76 y.o. Male) Treating RN: Montey Hora Primary Care Physician: Tedra Senegal Other Clinician: Referring Physician: Tedra Senegal Treating Physician/Extender: Frann Rider in Treatment: 0 Education Assessment Education Provided To: Patient Education Topics Provided Wound/Skin Impairment: Handouts: Other: wound  care as ordered Methods: Demonstration, Explain/Verbal Responses: State content correctly Electronic Signature(s) Signed: 10/08/2016 3:52:38 PM By: Montey Hora Entered By: Montey Hora on 10/08/2016 09:27:23 Boehning, [name] ([id number]) -------------------------------------------------------------------------------- Wound Assessment Details Patient Name: [name]. Date of Service: 10/08/2016 8:45 AM Medical Record Number: [id number] Patient Account Number: [id number] Date of Birth/Sex: 12/28/1940 (76 y.o. Male) Treating RN: Montey Hora Primary Care Physician: Tedra Senegal Other Clinician: Referring Physician: Tedra Senegal Treating Physician/Extender: Frann Rider in Treatment: 0 Wound Status Wound Number: 1 Primary To be determined Etiology: Wound Location: Abdomen - midline Wound Open Wounding Event: Gradually Appeared Status: Date Acquired: 09/24/2016 Comorbid Anemia, Hypertension, Peripheral Weeks Of Treatment: 0 History: Venous Disease, Type II Diabetes, Clustered Wound: No Gout, Neuropathy Photos Wound Measurements Length: (cm) 0.6 Width: (cm) 0.4 Depth: (cm) 0.1 Area: (cm) 0.188 Volume: (cm) 0.019 % Reduction in Area: % Reduction in Volume: Epithelialization: Small (1-33%) Tunneling: No Undermining: No Wound Description Classification: Partial Thickness Foul Odor Afte Wound Margin: Flat and Intact Exudate Amount: Large Exudate Type: Serous Exudate Color: amber r Cleansing: No Wound Bed Granulation Amount: Small (1-33%) Exposed Structure Granulation Quality: Pink Fascia Exposed: No Necrotic Amount: Large (67-100%) Fat Layer Exposed: No Necrotic Quality: Adherent Slough Tendon Exposed: No Muscle Exposed: No Bates, [name] ([id number]) Joint Exposed: No Bone Exposed: No Limited to Skin Breakdown Periwound Skin Texture Texture Color No Abnormalities Noted: No No Abnormalities Noted: No Callus: No Atrophie Blanche: No Crepitus: No Cyanosis: No Excoriation: No Ecchymosis: No Fluctuance: No Erythema: No Friable: No Hemosiderin Staining: No Induration: No Mottled: No Localized Edema: No Pallor: No Rash: No Rubor: No Scarring: No Temperature / Pain Moisture Temperature: No Abnormality No Abnormalities Noted: No Dry / Scaly: No Maceration: No Moist: Yes Wound Preparation Ulcer Cleansing: Rinsed/Irrigated with Saline Topical Anesthetic Applied: Other: lidocaine 4%, Electronic Signature(s) Signed: 10/08/2016 3:52:38 PM By: Montey Hora Entered By:  Montey Hora on 10/08/2016 09:19:54 Ferre, [name] ([id number]) -------------------------------------------------------------------------------- Vitals Details Patient Name: [name]. Date of Service: 10/08/2016 8:45 AM Medical Record Number: [id number] Patient Account Number: [id number] Date of Birth/Sex: 12/14/1940 (76 y.o. Male) Treating RN: Montey Hora Primary Care Physician: Tedra Senegal Other Clinician: Referring Physician: Tedra Senegal Treating Physician/Extender: Frann Rider in Treatment: 0 Vital Signs Time Taken: 09:01 Pulse (bpm): 45 Height (in): 71 Respiratory Rate (breaths/min): 16 Source: Measured Blood Pressure (mmHg): 130/46 Weight (lbs): 203 Reference Range: 80 - 120 mg / dl Source: Measured Body Mass Index (BMI): 28.3 Electronic Signature(s) Signed: 10/08/2016 3:52:38 PM By: Montey Hora Entered By: Montey Hora on 10/08/2016 09:03:19

## 2016-10-08 NOTE — Progress Notes (Addendum)
[name] ([id number]) Visit Report for 10/08/2016 Chief Complaint Document Details Patient Name: [name]. Date of Service: 10/08/2016 8:45 AM Medical Record Number: [id number] Patient Account Number: [id number] Date of Birth/Sex: 11-Jan-1941 (76 y.o. Male) Treating RN: [name] Primary Care Physician: [name] Other Clinician: Referring Physician: Tedra [name] Treating Physician/Extender: [name] in Treatment: 0 Information Obtained from: Patient Chief Complaint Patients presents for treatment of an open diabetic ulcer to the periumbilical area which she's had for about a month Electronic Signature(s) Signed: 10/08/2016 9:43:19 AM By: Johnny Fudge MD, FACS Entered By: [name] on 10/08/2016 09:43:19 [name], [name] ([id number]) -------------------------------------------------------------------------------- HPI Details Patient Name: [name]. Date of Service: 10/08/2016 8:45 AM Medical Record Number: [id number] Patient Account Number: [id number] Date of Birth/Sex: 08-May-1941 (76 y.o. Male) Treating RN: [name] Primary Care Physician: [name] Other Clinician: Referring Physician: Tedra [name] Treating Physician/Extender: [name] in Treatment: 0 History of Present Illness Location: periumbilical area Quality: Patient reports No Pain. Severity: Patient states wound (s) are getting better. 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 last hemoglobin A1c was 7.5%. status post AV fistula placement, breast surgery for left granulomatous mastitis, triple a repair in 1992, small bowel obstruction, upper arm angiograms for AV fistula on the left side. was noted to have a superficial skin wound in the periumbilical area since late December 2017. He was asked to clean it with hydrogen peroxide and apply Bactroban. he was seen by vascular surgery in October 2017 where his right ABI was 0.56 left ABI was 0.87 and right TBI was 0.69 and left TBI was 0.70 Electronic Signature(s) Signed: 10/08/2016 9:44:36 AM By: Johnny Fudge MD, FACS Previous Signature: 10/08/2016 9:41:16 AM Version By: Johnny Fudge MD, FACS Previous Signature: 10/08/2016 9:04:53 AM Version By: Johnny Fudge MD, FACS Previous Signature: 10/08/2016 9:04:09 AM Version By: Johnny Fudge MD, FACS Entered By: [name] on 10/08/2016 09:44:36 [name], [name] ([id number]) -------------------------------------------------------------------------------- Physical Exam Details Patient Name: [name]. Date of Service: 10/08/2016 8:45 AM Medical Record Number: [id number] Patient Account Number: [id number] Date of Birth/Sex: 1940/10/28 (76 y.o. Male) Treating RN: [name] Primary Care Physician: [name] Other Clinician: Referring Physician: Tedra [name] Treating Physician/Extender: [name] in Treatment: 0 Constitutional . Pulse regular. Respirations normal and unlabored. Afebrile. . Eyes Nonicteric. Reactive to light. Ears, Nose, Mouth, and Throat Lips, teeth, and gums WNL.Marland Kitchen Moist mucosa without lesions. Neck supple and nontender. No palpable supraclavicular or cervical adenopathy. Normal sized without goiter. Respiratory WNL. No retractions.. Breath sounds WNL, No rubs, rales, rhonchi, or  wheeze.. Cardiovascular Heart rhythm and rate regular, no murmur or gallop.. Pedal Pulses WNL. No clubbing, cyanosis or edema. Gastrointestinal (GI) Abdomen without masses or tenderness.. No liver or spleen enlargement or tenderness.. Lymphatic No adneopathy. No adenopathy. No adenopathy. Musculoskeletal Adexa without tenderness or enlargement.. Digits and nails w/o clubbing, cyanosis, infection, petechiae, ischemia, or inflammatory conditions.. Integumentary (Hair, Skin) No suspicious lesions. No crepitus or fluctuance.  No peri-wound warmth or erythema. No masses.Marland Kitchen Psychiatric Judgement and insight Intact.. No evidence of depression, anxiety, or agitation.. Notes midline scar with a lot of old scar tissue which possibly was healed by secondary intention now has on the left of his medical area a superficial ulceration with minimal slough which was washed out easily with saline. No sharp debridement was required. Electronic Signature(s) Signed: 10/08/2016 9:45:26 AM By: Johnny Fudge MD, FACS Entered By: [name] on 10/08/2016 09:45:26 [name], [name] ([id number]) -------------------------------------------------------------------------------- Physician Orders Details Patient Name: [name]. Date of Service: 10/08/2016 8:45 AM Medical Record Number: [id number] Patient Account Number: [id number] Date of Birth/Sex: 06/21/1941 (76 y.o. Male) Treating RN: [name] Primary Care Physician: [name] Other Clinician: Referring Physician: Tedra [name] Treating Physician/Extender: [name] in Treatment: 0 Verbal / Phone Orders: Yes Clinician: Montey [name] Read Back and Verified: Yes Diagnosis Coding Wound Cleansing Wound #1 Abdomen - midline o Clean wound with Normal Saline. Anesthetic Wound #1 Abdomen - midline o Topical Lidocaine 4% cream applied to wound bed prior to debridement Primary Wound Dressing Wound #1 Abdomen - midline o Prisma  Ag Secondary Dressing Wound #1 Abdomen - midline o Boardered Foam Dressing - or telfa island Dressing Change Frequency Wound #1 Abdomen - midline o Change dressing every day. Follow-up Appointments Wound #1 Abdomen - midline o Return Appointment in 1 week. Additional Orders / Instructions Wound #1 Abdomen - midline o Other: - please keep your clothing from rubbing on this area Medications-please add to medication list. Wound #1 Abdomen - midline o Other: - please add vitamin c, vitamin a and zinc supplements to your diet Electronic Signature(s) Card, JAMARI MOTEN ([id number]) Signed: 10/08/2016 3:40:08 PM By: Johnny Fudge MD, FACS Signed: 10/08/2016 3:52:38 PM By: [name] Entered By: [name] on 10/08/2016 09:30:02 [name], [name] ([id number]) -------------------------------------------------------------------------------- Problem List Details Patient Name: [name]. Date of Service: 10/08/2016 8:45 AM Medical Record Number: [id number] Patient Account Number: [id number] Date of Birth/Sex: [birth year] (76 y.o. Male) Treating RN: [name] Primary Care Physician: [name] Other Clinician: Referring Physician: Tedra [name] Treating Physician/Extender: [name] in Treatment: 0 Active Problems ICD-10 Encounter Code Description Active Date Diagnosis E11.622 Type 2 diabetes mellitus with other skin ulcer 10/08/2016 Yes S31.102A Unspecified open wound of abdominal wall, epigastric 10/08/2016 Yes region without penetration into peritoneal cavity, initial encounter Inactive Problems Resolved Problems Electronic Signature(s) Signed: 10/08/2016 9:42:58 AM By: Johnny Fudge MD, FACS Entered By: [name] on 10/08/2016 09:42:58 [name], [name] ([id number]) -------------------------------------------------------------------------------- Progress Note Details Patient Name: [name]. Date of Service: 10/08/2016 8:45 AM Medical  Record Number: [id number] Patient Account Number: [id number] Date of Birth/Sex: 1940/11/30 (76 y.o. Male) Treating RN: [name] Primary Care Physician: [name] Other Clinician: Referring Physician: Tedra [name] Treating Physician/Extender: [name] in Treatment: 0 Subjective Chief Complaint Information obtained from Patient Patients presents for treatment of an open diabetic ulcer to the periumbilical area which she's had for about a month History of Present Illness (HPI) The following HPI elements were documented for the patient's wound: Location: periumbilical area Quality: Patient reports No Pain. Severity: Patient states wound (s) are getting better. Duration: Patient has had the wound for < 4 weeks prior to presenting for treatment Context: The wound appeared gradually over time Modifying Factors: Patient is currently on renal dialysis and receives treatments 3 times weekly Associated Signs and Symptoms: Patient reports having:then local care with hydrogen peroxide and Bactroban ointment The 76 year old gentleman who  has a past medical history of diabetes mellitus, end-stage renal disease on dialysis, hypertension, secondary hyperparathyroidism, peripheral vascular disease and history of previous arterial bifemoral bypass graft in 1992 also has a history of gout, hyperlipidemia, hypothyroidism. last hemoglobin A1c was 7.5%. status post AV fistula placement, breast surgery for left granulomatous mastitis, triple a repair in 1992, small bowel obstruction, upper arm angiograms for AV fistula on the left side. was noted to have a superficial skin wound in the periumbilical area since late December 2017. He was asked to clean it with hydrogen peroxide and apply Bactroban. he was seen by vascular surgery in October 2017 where his right ABI was 0.56 left ABI was 0.87 and right TBI was 0.69 and left TBI was 0.70 Wound History Patient presents with 1 open wound that  has been present for approximately 2 months. Patient has been treating wound in the following manner: bacitracin. Laboratory tests have been performed in the last month. Patient reportedly has not tested positive for an antibiotic resistant organism. Patient reportedly has not tested positive for osteomyelitis. Patient reportedly has not had testing performed to evaluate circulation in the legs. Patient History Information obtained from Patient. Allergies Plaia, TRAVIN MARIK ([id number]) penicillin Social History Former smoker - quit in 1996, Marital Status - Married, Alcohol Use - Never - quit in 1976, Drug Use - No History, Caffeine Use - Never. Medical History Hematologic/Lymphatic Patient has history of Anemia Cardiovascular Patient has history of Hypertension, Peripheral Venous Disease Endocrine Patient has history of Type II Diabetes Musculoskeletal Patient has history of Gout Neurologic Patient has history of Neuropathy Oncologic Denies history of Received Chemotherapy, Received Radiation Patient is treated with Insulin. 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 Review of Systems (ROS) Constitutional Symptoms (General Health) The patient has no complaints or symptoms. Eyes The patient has no complaints or symptoms. Ear/Nose/Mouth/Throat The patient has no complaints or symptoms. Hematologic/Lymphatic The patient has no complaints or symptoms. Respiratory The patient has no complaints or symptoms. Gastrointestinal The patient has no complaints or symptoms. 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 Immunological The patient has no complaints or symptoms. Integumentary (Skin) The patient has no complaints or symptoms. Neurologic The patient has no complaints or symptoms. Oncologic Derrick, LILTON PARE ([id number]) The patient has no  complaints or symptoms. Psychiatric The patient has no complaints or symptoms. Medications acetaminophen-codeine tablet pioglitazone 30 mg tablet oral tablet oral mupirocin 2 % topical ointment topical ointment topical Objective Constitutional Pulse regular. Respirations normal and unlabored. Afebrile. Vitals Time Taken: 9:01 AM, Height: 71 in, Source: Measured, Weight: 203 lbs, Source: Measured, BMI: 28.3, Pulse: 45 bpm, Respiratory Rate: 16 breaths/min, Blood Pressure: 130/46 mmHg. Eyes Nonicteric. Reactive to light. Ears, Nose, Mouth, and Throat Lips, teeth, and gums WNL.Marland Kitchen Moist mucosa without lesions. Neck supple and nontender. No palpable supraclavicular or cervical adenopathy. Normal sized without goiter. Respiratory WNL. No retractions.. Breath sounds WNL, No rubs, rales, rhonchi, or wheeze.. Cardiovascular Heart rhythm and rate regular, no murmur or gallop.. Pedal Pulses WNL. No clubbing, cyanosis or edema. Gastrointestinal (GI) Abdomen without masses or tenderness.. No liver or spleen enlargement or tenderness.. Lymphatic No adneopathy. No adenopathy. No adenopathy. Musculoskeletal Adexa without tenderness or enlargement.. Digits and nails w/o clubbing, cyanosis, infection, petechiae, ischemia, or inflammatory conditions.Marland Kitchen [name], [name] ([id number]) Psychiatric Judgement and insight Intact.. No evidence of depression, anxiety, or agitation.. General Notes: midline scar with a lot of old scar  tissue which possibly was healed by secondary intention now has on the left of his medical area a superficial ulceration with minimal slough which was washed out easily with saline. No sharp debridement was required. Integumentary (Hair, Skin) No suspicious lesions. No crepitus or fluctuance. No peri-wound warmth or erythema. No masses.. Wound #1 status is Open. Original cause of wound was Gradually Appeared. The wound is located on the Abdomen - midline. The wound measures 0.6cm  length x 0.4cm width x 0.1cm depth; 0.188cm^2 area and 0.019cm^3 volume. The wound is limited to skin breakdown. There is no tunneling or undermining noted. There is a large amount of serous drainage noted. The wound margin is flat and intact. There is small (1-33%) pink granulation within the wound bed. There is a large (67-100%) amount of necrotic tissue within the wound bed including Adherent Slough. The periwound skin appearance exhibited: Moist. The periwound skin appearance did not exhibit: Callus, Crepitus, Excoriation, Fluctuance, Friable, Induration, Localized Edema, Rash, Scarring, Dry/Scaly, Maceration, Atrophie Blanche, Cyanosis, Ecchymosis, Hemosiderin Staining, Mottled, Pallor, Rubor, Erythema. Periwound temperature was noted as No Abnormality. Assessment Active Problems ICD-10 E11.622 - Type 2 diabetes mellitus with other skin ulcer S31.102A - Unspecified open wound of abdominal wall, epigastric region without penetration into peritoneal cavity, initial encounter 76 year old diabetic patient with significant scar tissue in the periumbilical region had possibly an abrasion which led to a shallow ulcer which is well on the way to healing. I have recommended: 1. Cleaning soap and water, applying Prisma AG and bordered foam and have discussed offloading this in great detail 2. Good control of his diabetes mellitus, adequate protein, vitamin A, vitamin C and zinc 3. Regular visits to the wound center He has all his questions answered and will be seen back next week [name], [name] ([id number]) Plan Wound Cleansing: Wound #1 Abdomen - midline: Clean wound with Normal Saline. Anesthetic: Wound #1 Abdomen - midline: Topical Lidocaine 4% cream applied to wound bed prior to debridement Primary Wound Dressing: Wound #1 Abdomen - midline: Prisma Ag Secondary Dressing: Wound #1 Abdomen - midline: Boardered Foam Dressing - or telfa island Dressing Change Frequency: Wound #1  Abdomen - midline: Change dressing every day. Follow-up Appointments: Wound #1 Abdomen - midline: Return Appointment in 1 week. Additional Orders / Instructions: Wound #1 Abdomen - midline: Other: - please keep your clothing from rubbing on this area Medications-please add to medication list.: Wound #1 Abdomen - midline: Other: - please add vitamin c, vitamin a and zinc supplements to your diet 76 year old diabetic patient with significant scar tissue in the periumbilical region had possibly an abrasion which led to a shallow ulcer which is well on the way to healing. I have recommended: 1. Cleaning soap and water, applying Prisma AG and bordered foam and have discussed offloading this in great detail 2. Good control of his diabetes mellitus, adequate protein, vitamin A, vitamin C and zinc 3. Regular visits to the wound center He has all his questions answered and will be seen back next week Electronic Signature(s) Signed: 10/08/2016 9:46:59 AM By: Johnny Fudge MD, FACS Entered By: [name] on 10/08/2016 09:46:59 [name], [name] ([id number]) [name], [name] ([id number]) -------------------------------------------------------------------------------- ROS/PFSH Details Patient Name: [name]. Date of Service: 10/08/2016 8:45 AM Medical Record Number: [id number] Patient Account Number: [id number] Date of Birth/Sex: June 12, 1941 (76 y.o. Male) Treating RN: [name] Primary Care Physician: [name] Other Clinician: Referring Physician: Tedra [name] Treating Physician/Extender: [name] in Treatment: 0 Information Obtained From Patient  Wound History Do you currently have one or more open woundso Yes How many open wounds do you currently haveo 1 Approximately how long have you had your woundso 2 months How have you been treating your wound(s) until nowo bacitracin Has your wound(s) ever healed and then re-openedo No Have you had any lab work done in the  past montho Yes Who ordered the lab work doneo PCP Have you tested positive for an antibiotic resistant organism (MRSA, VRE)o No Have you tested positive for osteomyelitis (bone infection)o No Have you had any tests for circulation on your legso No Genitourinary Complaints and Symptoms: Positive for: Kidney failure/ Dialysis - ESRD on HD Constitutional Symptoms (General Health) Complaints and Symptoms: No Complaints or Symptoms Eyes Complaints and Symptoms: No Complaints or Symptoms Ear/Nose/Mouth/Throat Complaints and Symptoms: No Complaints or Symptoms Hematologic/Lymphatic Complaints and Symptoms: No Complaints or Symptoms Medical History: Positive for: Anemia Perin, [name] ([id number]) Respiratory Complaints and Symptoms: No Complaints or Symptoms Cardiovascular Medical History: Positive for: Hypertension; Peripheral Venous Disease Past Medical History Notes: artificial valve, hyperlipidemia Gastrointestinal Complaints and Symptoms: No Complaints or Symptoms Medical History: Past Medical History Notes: scar tissue from multiple surgeries Endocrine Medical History: Positive for: Type II Diabetes Treated with: Insulin Immunological Complaints and Symptoms: No Complaints or Symptoms Integumentary (Skin) Complaints and Symptoms: No Complaints or Symptoms Musculoskeletal Medical History: Positive for: Gout Neurologic Complaints and Symptoms: No Complaints or Symptoms Medical History: Positive for: Neuropathy Oncologic Hannum, [name] ([id number]) Complaints and Symptoms: No Complaints or Symptoms Medical History: Negative for: Received Chemotherapy; Received Radiation Psychiatric Complaints and Symptoms: No Complaints or Symptoms Immunizations Pneumococcal Vaccine: Received Pneumococcal Vaccination: Yes Immunization Notes: uo to date Family and Social History Former smoker - quit in 1996; Marital Status - Married; Alcohol Use: Never - quit in  1976; Drug Use: No History; Caffeine Use: Never; Financial Concerns: No; Food, Clothing or Shelter Needs: No; Support System Lacking: No; Transportation Concerns: No; Advanced Directives: No; Patient does not want information on Advanced Directives Physician Affirmation I have reviewed and agree with the above information. Electronic Signature(s) Signed: 10/08/2016 3:40:08 PM By: Johnny Fudge MD, FACS Signed: 10/08/2016 3:52:38 PM By: [name] Entered By: [name] on 10/08/2016 09:24:24 [name], [name] ([id number]) -------------------------------------------------------------------------------- SuperBill Details Patient Name: [name]. Date of Service: 10/08/2016 Medical Record Number: [id number] Patient Account Number: [id number] Date of Birth/Sex: Jan 19, 1941 (76 y.o. Male) Treating RN: [name] Primary Care Physician: [name] Other Clinician: Referring Physician: Tedra [name] Treating Physician/Extender: [name] in Treatment: 0 Diagnosis Coding ICD-10 Codes Code Description [phone] Type 2 diabetes mellitus with other skin ulcer Unspecified open wound of abdominal wall, epigastric region without penetration into S31.102A peritoneal cavity, initial encounter Facility Procedures CPT4 Code: [id number] Description: 99213 - WOUND CARE VISIT-LEV 3 EST PT Modifier: Quantity: 1 Physician Procedures CPT4: Description Modifier Quantity Code [id number] - WC PHYS LEVEL 4 - NEW PT 1 ICD-10 Description Diagnosis E11.622 Type 2 diabetes mellitus with other skin ulcer S31.102A Unspecified open wound of abdominal wall, epigastric region without  penetration into peritoneal cavity, initial encounter Electronic Signature(s) Signed: 10/08/2016 9:47:38 AM By: Johnny Fudge MD, FACS Entered By: [name] on 10/08/2016 09:47:38

## 2016-10-09 LAB — FRUCTOSAMINE: Fructosamine: 355 umol/L — ABNORMAL HIGH (ref 0–285)

## 2016-10-10 DIAGNOSIS — N2581 Secondary hyperparathyroidism of renal origin: Secondary | ICD-10-CM | POA: Diagnosis not present

## 2016-10-10 DIAGNOSIS — D509 Iron deficiency anemia, unspecified: Secondary | ICD-10-CM | POA: Diagnosis not present

## 2016-10-10 DIAGNOSIS — D631 Anemia in chronic kidney disease: Secondary | ICD-10-CM | POA: Diagnosis not present

## 2016-10-10 DIAGNOSIS — E1129 Type 2 diabetes mellitus with other diabetic kidney complication: Secondary | ICD-10-CM | POA: Diagnosis not present

## 2016-10-10 DIAGNOSIS — Z23 Encounter for immunization: Secondary | ICD-10-CM | POA: Diagnosis not present

## 2016-10-10 DIAGNOSIS — N186 End stage renal disease: Secondary | ICD-10-CM | POA: Diagnosis not present

## 2016-10-11 ENCOUNTER — Ambulatory Visit: Payer: Medicare Other | Admitting: Endocrinology

## 2016-10-12 ENCOUNTER — Ambulatory Visit (INDEPENDENT_AMBULATORY_CARE_PROVIDER_SITE_OTHER): Payer: Medicare Other | Admitting: Endocrinology

## 2016-10-12 ENCOUNTER — Encounter: Payer: Self-pay | Admitting: Endocrinology

## 2016-10-12 VITALS — BP 118/78 | HR 62 | Ht 71.0 in | Wt 202.0 lb

## 2016-10-12 DIAGNOSIS — N186 End stage renal disease: Secondary | ICD-10-CM | POA: Diagnosis not present

## 2016-10-12 DIAGNOSIS — D509 Iron deficiency anemia, unspecified: Secondary | ICD-10-CM | POA: Diagnosis not present

## 2016-10-12 DIAGNOSIS — Z23 Encounter for immunization: Secondary | ICD-10-CM | POA: Diagnosis not present

## 2016-10-12 DIAGNOSIS — D631 Anemia in chronic kidney disease: Secondary | ICD-10-CM | POA: Diagnosis not present

## 2016-10-12 DIAGNOSIS — E1129 Type 2 diabetes mellitus with other diabetic kidney complication: Secondary | ICD-10-CM | POA: Diagnosis not present

## 2016-10-12 DIAGNOSIS — N2581 Secondary hyperparathyroidism of renal origin: Secondary | ICD-10-CM | POA: Diagnosis not present

## 2016-10-12 DIAGNOSIS — E1165 Type 2 diabetes mellitus with hyperglycemia: Secondary | ICD-10-CM

## 2016-10-12 DIAGNOSIS — Z794 Long term (current) use of insulin: Secondary | ICD-10-CM | POA: Diagnosis not present

## 2016-10-12 NOTE — Progress Notes (Signed)
Patient ID: [name], male   DOB: 06/28/1941, 76 y.o.   MRN: [id number]           Reason for Appointment:  Follow-up for Type 2 Diabetes  Referring physician: [place]  History of Present Illness:          Date of diagnosis of type 2 diabetes mellitus :        Background history:  He has had long-standing diabetes probably treated with metformin initially and subsequently with sulfonylurea drugs At some point he was also given Actos in addition to his glipizide which he is still taking Records of his control are only available for the last 4 years Over the last few years his A1c has been generally in the 6-7% range His A1c was 6.2 in May 2016 and apparently was starting to get low normal blood sugars at times He was admitted to the hospital for fluid overload and renal failure and at that time his Actos was stopped Subsequently his blood sugars have been markedly increased He was switched from regular insulin to NovoLog on his initial consultation  Recent history:    His last A1c was 7.5 and previously as high as 8.9 Fructosamine now is 355, previously 407  INSULIN regimen is described as: Lantus 10 units in am; 10 units Novolog ac bid with syringe    Dialysis is done at 12 noon, doing it on Monday/Wednesday/Fridays     Current blood sugar patterns and problems identified:  His blood sugars are overall improved compared to his previous visit  Although he was told to increase his Lantus to 13 units he is still taking 10 units  Continues to take Actos without difficulty  He has only about 8 or 9 readings and his meter for the last month and date is not programmed properly and difficult to download today Most of his readings are in the morning and variable times, probably fasting and these are mildly increased  Lab glucose was low because he took his insulin and did not eat breakfast  He forgets to check his blood sugar after evening meal and has only one glucose late at  night  He still does not take any insulin for his snacks at dialysis he starts eating at about noon but has not checked readings after coming back recently  Not able to do much exercise  Oral hypoglycemic drugs the patient is taking are:  on Actos 30 mg daily in a.m.     Side effects from medications have been: None  Compliance with the medical regimen: Good   Glucose monitoring:  done 0-2  times a day         Glucometer:  Freestyle  Blood Glucose readings by review of home monitoring  Mean values apply above for all meters except median for One Touch  PRE-MEAL Fasting Lunch Dinner Bedtime Overall  Glucose range: 142-164  154 219   Mean/median:        POST-MEAL PC Breakfast PC Lunch PC Dinner  Glucose range:  241   Mean/median:        Self-care: The diet that the patient has been following is: tries to limit sweets, portions. eating out only about once a week at a steak house        Typical meal intake: Breakfast is  eggs and toast at 9 am, occ light lunch with mostly snacks like peanut butter crackers or half sandwich; supper 8 pm   Lunch is irregular on nondialysis  days but has a Larger lunch on Sundays             Dietician visit, most recent: ?               Exercise: none, not able to do much   Weight history:  Wt Readings from Last 3 Encounters:  10/12/16 202 lb (91.6 kg)  10/04/16 198 lb (89.8 kg)  09/20/16 200 lb (90.7 kg)    Glycemic control:   Lab Results  Component Value Date   HGBA1C 7.9 (H) 09/03/2016   HGBA1C 7.5 (H) 07/10/2016   HGBA1C 7.4 (H) 06/14/2016   Lab Results  Component Value Date   MICROALBUR 37.2 09/03/2016   LDLCALC 31 09/03/2016   CREATININE 9.18 (H) 09/03/2016    Lab Results  Component Value Date   FRUCTOSAMINE 355 (H) 10/08/2016   FRUCTOSAMINE 407 (H) 08/29/2016   FRUCTOSAMINE 357 (H) 03/01/2016      Allergies as of 10/12/2016      Reactions   Penicillins Rash   Has patient had a PCN reaction causing immediate rash,  facial/tongue/throat swelling, SOB or lightheadedness with hypotension: Yes Has patient had a PCN reaction causing severe rash involving mucus membranes or skin necrosis: Yes Has patient had a PCN reaction that required hospitalization: No Has patient had a PCN reaction occurring within the last 10 years: No If all of the above answers are "NO", then may proceed with Cephalosporin use.      Medication List       Accurate as of 10/12/16 11:22 AM. Always use your most recent med list.          acetaminophen 500 MG tablet Commonly known as:  TYLENOL Take 500 mg by mouth every 6 (six) hours as needed (for pain).   acetaminophen-codeine 300-30 MG tablet Commonly known as:  TYLENOL #3 take 1 tablet by mouth every 4 hours if needed for pain   allopurinol 100 MG tablet Commonly known as:  ZYLOPRIM Take 100 mg by mouth daily.   aspirin 325 MG tablet Take 650 mg by mouth daily.   clobetasol 0.05 % external solution Commonly known as:  TEMOVATE apply to affected area ON THE SCALP UP TO 2 TIMES A DAY AS NEEDED...  (REFER TO PRESCRIPTION NOTES).   donepezil 10 MG tablet Commonly known as:  ARICEPT Take 1 tablet (10 mg total) by mouth at bedtime.   Fluocinolone Acetonide Scalp 0.01 % Oil apply to affected area ON SCALP EVERY NIGHT AT BEDTIME, WASH UPON AWAKENING   fluticasone 0.05 % cream Commonly known as:  CUTIVATE apply to affected area ON FACE UP TO 2 TIMES A DAY AS NEEDED   fluticasone 50 MCG/ACT nasal spray Commonly known as:  FLONASE Place 1 spray into both nostrils daily.   FREESTYLE FREEDOM LITE w/Device Kit Use to check blood sugar 2 times per day dx code E11.65   freestyle lancets Use as instructed to check blood sugar 2 times per day dx code E11.65   glucose blood test strip Commonly known as:  ONETOUCH VERIO Use to check blood sugar 2 times per day. Dx code E11.65   insulin aspart 100 UNIT/ML injection Commonly known as:  novoLOG Inject 10 Units into the  skin 3 (three) times daily before meals.   insulin glargine 100 UNIT/ML injection Commonly known as:  LANTUS Inject 0.08 mLs (8 Units total) into the skin every morning.   ketoconazole 2 % shampoo Commonly known as:  NIZORAL Apply 1 application  topically 2 (two) times a week.   levothyroxine 50 MCG tablet Commonly known as:  SYNTHROID, LEVOTHROID Take 1 tablet (50 mcg total) by mouth daily.   lidocaine-prilocaine cream Commonly known as:  EMLA Apply 1 application topically once. AS DIRECTED   loratadine 10 MG tablet Commonly known as:  CLARITIN Take 10 mg by mouth daily as needed for allergies.   metroNIDAZOLE 0.75 % gel Commonly known as:  METROGEL APPLY TO AFFECTED AREA ON SKIN  ON FACE TWICE DAILY   mupirocin ointment 2 % Commonly known as:  BACTROBAN Use as directed to affected areas   Naftifine HCl 2 % Crea Commonly known as:  NAFTIN Apply to feet daily for fungal infection   pioglitazone 30 MG tablet Commonly known as:  ACTOS Take 1 tablet (30 mg total) by mouth daily.   RENVELA 800 MG tablet Generic drug:  sevelamer carbonate Take 800-2,400 mg by mouth See admin instructions. 1,600-2,400 mg with breakfast then 1,600 mg with lunch then 2,400 mg with dinner (evening meal) and 800 mg with each snack   SENSIPAR 30 MG tablet Generic drug:  cinacalcet Take 30 mg by mouth daily.   simvastatin 20 MG tablet Commonly known as:  ZOCOR Take 20 mg by mouth at bedtime.   triamcinolone ointment 0.1 % Commonly known as:  KENALOG Apply 1 application topically 2 (two) times daily. Use between thighs       Allergies:  Allergies  Allergen Reactions  . Penicillins Rash    Has patient had a PCN reaction causing immediate rash, facial/tongue/throat swelling, SOB or lightheadedness with hypotension: Yes Has patient had a PCN reaction causing severe rash involving mucus membranes or skin necrosis: Yes Has patient had a PCN reaction that required hospitalization: No Has  patient had a PCN reaction occurring within the last 10 years: No If all of the above answers are "NO", then may proceed with Cephalosporin use.     Past Medical History:  Diagnosis Date  . Allergy   . Anemia   . Arthritis   . Coronary artery disease   . Diabetes mellitus    Type 2  . Diverticulitis   . ED (erectile dysfunction)   . 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 ESRD (end stage renal disease) on dialysis ([place]) 03/2015  . GERD (gastroesophageal reflux disease)    pepto   . Gout   . Hyperlipidemia   . Hypertension   . Hypothyroidism   . Pneumonia   . PVD (peripheral vascular disease) ([place])    has plastic aorta  . Renal insufficiency   . Seasonal allergies   . Shortness of breath dyspnea   . Sleep apnea   . Thyroid disease     Past Surgical History:  Procedure Laterality Date  . aortobifemoral bypass    . AV FISTULA PLACEMENT Left 12/01/2013   Procedure: ARTERIOVENOUS (AV) FISTULA CREATION- LEFT BRACHIOCEPHALIC;  Surgeon: Angelia Mould, MD;  Location: [place];  Service: Vascular;  Laterality: Left;  . [place] TRANSPOSITION Right 07/27/2014   Procedure: BASCILIC VEIN TRANSPOSITION;  Surgeon: Angelia Mould, MD;  Location: [facility];  Service: Vascular;  Laterality: Right;  . BREAST SURGERY     left - granulomatous mastitis  . COLONOSCOPY    . 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 EYE SURGERY Bilateral    cataracts  . REVISON OF ARTERIOVENOUS FISTULA Left 02/09/2014   Procedure: REVISON OF LEFT ARTERIOVENOUS FISTULA - RESECTION OF RENDUNDANT  VEIN;  Surgeon: Angelia Mould, MD;  Location: [place];  Service: Vascular;  Laterality: Left;  . SBO with lysis adhesions    . SHUNTOGRAM Left 04/19/2014   Procedure: FISTULOGRAM;  Surgeon: Angelia Mould, MD;  Location: [facility] CATH LAB;  Service: Cardiovascular;  Laterality: Left;  . UNILATERAL UPPER EXTREMEITY ANGIOGRAM N/A 07/12/2014   Procedure: UNILATERAL UPPER Anselmo Rod;  Surgeon:  Angelia Mould, MD;  Location: [facility] C[facility];  Service: Cardiovascular;  Laterality: N/A;    Family History  Problem Relation Age of Onset  . Aneurysm Mother   . Heart disease Father   . Stroke Father   . Hypertension Father   . Diabetes Father   . Dementia Neg Hx     Social History:  reports that he quit smoking about 22 years ago. He has never used smokeless tobacco. He reports that he does not drink alcohol or use drugs.    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 Fructosamine 10/08/2016 355* 0 - 285 umol/L Final   Comment: Published reference interval for apparently healthy subjects between age 44 and 52 is 30 - 285 umol/L and in a poorly controlled diabetic population is 228 - 563 umol/L with a mean of 396 umol/L.   Marland Kitchen Glucose, Bld 10/08/2016 59* 70 - 99 mg/dL Final    Physical Examination:  BP 118/78   Pulse 62   Ht 5' 11"  (1.803 m)   Wt 202 lb (91.6 kg)   BMI 28.17 kg/m       ASSESSMENT:  Diabetes type 2, uncontrolled See history of present illness for detailed discussion of his current management, blood sugar patterns and problems identified  He again has recently better glucose readings even though he has not increase his insulin as directed May be having a delayed effect of Actos; also is taking somewhat more Novolog as directed Still taking low doses of Lantus insulin and fasting readings are only mildly  increased Difficult to know how to adjust his mealtime doses as he does not monitor after meals Also may be getting higher readings are meeting snacks while at dialysis Diet has been assessed previously by nurse educator   PLAN:   He will try to program the date and time on his meter  Reminded him to check his blood sugars more often at least for a couple of weeks prior to his next visit  Need to check some readings after meals at various times  Most likely can increase her suppertime Novolog to 12 units  He will try to take 5 units Novolog when he gets to the dialysis unit or else take it right before leaving home  Increase Lantus to 11 units for now  Recheck A1c on the next visit.  Again reminded him that Novolog needs to be taken 5-10  minutes before eating and not to take it if he is going to be fasting or skipping a meal.  He will also need to take insulin at lunchtime if he has a meal especially on Sundays  Discussed instructions that were printed out today  Encouraged him to be more active with walking   Patient Instructions  Check blood sugars on waking up  3x per week  Also check blood sugars about 2 hours after a meal and do this after different meals by rotation  Recommended blood sugar levels on waking up is 90-130 and about 2 hours after meal is 130-160  Please bring your blood sugar monitor to each visit, thank you  Instructions for insulin on dialysis days:  Take 10 units of NovoLog at breakfast and 5 units when reaching dialysis unit. NovoLog Insulin should be taken within 15 minutes of planning meal intake  Instructions for insulin when not having dialysis:  Take 8 units of NovoLog at breakfast and if eating lunch take 8 units also  DINNERTIME: Take 12 units every day  Lantus: Increase Lantus to 11 units daily  If you are fasting for labs do not take the Novolog     Counseling time on subjects discussed above is over 50% of today's 25 minute  visit       [name] 10/12/2016, 11:22 AM   Note: This office note was prepared with Dragon voice recognition system technology. Any transcriptional errors that result from this process are unintentional.

## 2016-10-12 NOTE — Patient Instructions (Addendum)
Check blood sugars on waking up  3x per week  Also check blood sugars about 2 hours after a meal and do this after different meals by rotation  Recommended blood sugar levels on waking up is 90-130 and about 2 hours after meal is 130-160  Please bring your blood sugar monitor to each visit, thank you  Instructions for insulin on dialysis days:  Take 10 units of NovoLog at breakfast and 5 units when reaching dialysis unit. NovoLog Insulin should be taken within 15 minutes of planning meal intake  Instructions for insulin when not having dialysis:  Take 8 units of NovoLog at breakfast and if eating lunch take 8 units also  DINNERTIME: Take 12 units every day  Lantus: Increase Lantus to 11 units daily  If you are fasting for labs do not take the Novolog

## 2016-10-15 ENCOUNTER — Encounter: Payer: Medicare Other | Admitting: Surgery

## 2016-10-15 DIAGNOSIS — D631 Anemia in chronic kidney disease: Secondary | ICD-10-CM | POA: Diagnosis not present

## 2016-10-15 DIAGNOSIS — E1122 Type 2 diabetes mellitus with diabetic chronic kidney disease: Secondary | ICD-10-CM | POA: Diagnosis not present

## 2016-10-15 DIAGNOSIS — L98491 Non-pressure chronic ulcer of skin of other sites limited to breakdown of skin: Secondary | ICD-10-CM | POA: Diagnosis not present

## 2016-10-15 DIAGNOSIS — E11622 Type 2 diabetes mellitus with other skin ulcer: Secondary | ICD-10-CM | POA: Diagnosis not present

## 2016-10-15 DIAGNOSIS — I120 Hypertensive chronic kidney disease with stage 5 chronic kidney disease or end stage renal disease: Secondary | ICD-10-CM | POA: Diagnosis not present

## 2016-10-15 DIAGNOSIS — N2581 Secondary hyperparathyroidism of renal origin: Secondary | ICD-10-CM | POA: Diagnosis not present

## 2016-10-15 DIAGNOSIS — N186 End stage renal disease: Secondary | ICD-10-CM | POA: Diagnosis not present

## 2016-10-15 DIAGNOSIS — D509 Iron deficiency anemia, unspecified: Secondary | ICD-10-CM | POA: Diagnosis not present

## 2016-10-15 DIAGNOSIS — Z23 Encounter for immunization: Secondary | ICD-10-CM | POA: Diagnosis not present

## 2016-10-15 DIAGNOSIS — S31102A Unspecified open wound of abdominal wall, epigastric region without penetration into peritoneal cavity, initial encounter: Secondary | ICD-10-CM | POA: Diagnosis not present

## 2016-10-15 DIAGNOSIS — E1129 Type 2 diabetes mellitus with other diabetic kidney complication: Secondary | ICD-10-CM | POA: Diagnosis not present

## 2016-10-15 DIAGNOSIS — Z992 Dependence on renal dialysis: Secondary | ICD-10-CM | POA: Diagnosis not present

## 2016-10-16 NOTE — Progress Notes (Signed)
DONTERRIUS, SANTUCCI ([id number]) Visit Report for 10/15/2016 Arrival Information Details Patient Name: [name], [name]. Date of Service: 10/15/2016 9:15 AM Medical Record Number: [id number] Patient Account Number: [id number] Date of Birth/Sex: 21-Sep-1941 (76 y.o. Male) Treating RN: Montey Hora Primary Care Latavion Halls: Tedra Senegal Other Clinician: Referring Kaileena Obi: Tedra Senegal Treating Velva Molinari/Extender: Frann Rider in Treatment: 1 Visit Information History Since Last Visit Added or deleted any medications: No Patient Arrived: Ambulatory Any new allergies or adverse reactions: No Arrival Time: 09:22 Had a fall or experienced change in No Accompanied By: self activities of daily living that may affect Transfer Assistance: None risk of falls: Patient Identification Verified: Yes Signs or symptoms of abuse/neglect since last No Secondary Verification Process Yes visito Completed: Hospitalized since last visit: No Patient Has Alerts: Yes Pain Present Now: No Patient Alerts: DMII aspirin 650 Electronic Signature(s) Signed: 10/15/2016 4:53:16 PM By: Montey Hora Entered By: Montey Hora on 10/15/2016 09:23:13 E[place], [name] ([id number]) -------------------------------------------------------------------------------- Clinic Level of Care Assessment Details Patient Name: [name]. Date of Service: 10/15/2016 9:15 AM Medical Record Number: [id number] Patient Account Number: [id number] Date of Birth/Sex: 1941-05-21 (76 y.o. Male) Treating RN: Montey Hora Primary Care Mahoganie Basher: Tedra Senegal Other Clinician: Referring Synia Douglass: Tedra Senegal Treating Ayari Liwanag/Extender: Frann Rider in Treatment: 1 Clinic Level of Care Assessment Items TOOL 4 Quantity Score []  - Use when only an EandM is performed on FOLLOW-UP visit 0 ASSESSMENTS - Nursing Assessment / Reassessment X - Reassessment of Co-morbidities (includes updates in patient status) 1 10 X -  Reassessment of Adherence to Treatment Plan 1 5 ASSESSMENTS - Wound and Skin Assessment / Reassessment X - Simple Wound Assessment / Reassessment - one wound 1 5 []  - Complex Wound Assessment / Reassessment - multiple wounds 0 []  - Dermatologic / Skin Assessment (not related to wound area) 0 ASSESSMENTS - Focused Assessment []  - Circumferential Edema Measurements - multi extremities 0 []  - Nutritional Assessment / Counseling / Intervention 0 []  - Lower Extremity Assessment (monofilament, tuning fork, pulses) 0 []  - Peripheral Arterial Disease Assessment (using hand held doppler) 0 ASSESSMENTS - Ostomy and/or Continence Assessment and Care []  - Incontinence Assessment and Management 0 []  - Ostomy Care Assessment and Management (repouching, etc.) 0 PROCESS - Coordination of Care X - Simple Patient / Family Education for ongoing care 1 15 []  - Complex (extensive) Patient / Family Education for ongoing care 0 []  - Staff obtains Programmer, systems, Records, Test Results / Process Orders 0 []  - Staff telephones HHA, Nursing Homes / Clarify orders / etc 0 []  - Routine Transfer to another Facility (non-emergent condition) 0 [place], [name] ([id number]) []  - Routine Hospital Admission (non-emergent condition) 0 []  - New Admissions / Biomedical engineer / Ordering NPWT, Apligraf, etc. 0 []  - Emergency Hospital Admission (emergent condition) 0 X - Simple Discharge Coordination 1 10 []  - Complex (extensive) Discharge Coordination 0 PROCESS - Special Needs []  - Pediatric / Minor Patient Management 0 []  - Isolation Patient Management 0 []  - Hearing / Language / Visual special needs 0 []  - Assessment of Community assistance (transportation, D/C planning, etc.) 0 []  - Additional assistance / Altered mentation 0 []  - Support Surface(s) Assessment (bed, cushion, seat, etc.) 0 INTERVENTIONS - Wound Cleansing / Measurement X - Simple Wound Cleansing - one wound 1 5 []  - Complex Wound Cleansing - multiple  wounds 0 X - Wound Imaging (photographs - any number of wounds) 1 5 []  - Wound Tracing (instead of photographs) 0 X -  Simple Wound Measurement - one wound 1 5 []  - Complex Wound Measurement - multiple wounds 0 INTERVENTIONS - Wound Dressings X - Small Wound Dressing one or multiple wounds 1 10 []  - Medium Wound Dressing one or multiple wounds 0 []  - Large Wound Dressing one or multiple wounds 0 []  - Application of Medications - topical 0 []  - Application of Medications - injection 0 INTERVENTIONS - Miscellaneous []  - External ear exam 0 Gaymon, [name] ([id number]) []  - Specimen Collection (cultures, biopsies, blood, body fluids, etc.) 0 []  - Specimen(s) / Culture(s) sent or taken to Lab for analysis 0 []  - Patient Transfer (multiple staff / Harrel Lemon Lift / Similar devices) 0 []  - Simple Staple / Suture removal (25 or less) 0 []  - Complex Staple / Suture removal (26 or more) 0 []  - Hypo / Hyperglycemic Management (close monitor of Blood Glucose) 0 []  - Ankle / Brachial Index (ABI) - do not check if billed separately 0 X - Vital Signs 1 5 Has the patient been seen at the hospital within the last three years: Yes Total Score: 75 Level Of Care: New/Established - Level 2 Electronic Signature(s) Signed: 10/15/2016 4:53:16 PM By: Montey Hora Entered By: Montey Hora on 10/15/2016 09:41:25 [place], [name] ([id number]) -------------------------------------------------------------------------------- Encounter Discharge Information Details Patient Name: [name]. Date of Service: 10/15/2016 9:15 AM Medical Record Number: [id number] Patient Account Number: [id number] Date of Birth/Sex: [birth year] (76 y.o. Male) Treating RN: Montey Hora Primary Care Sharma Lawrance: Tedra Senegal Other Clinician: Referring Sanyah Molnar: Tedra Senegal Treating Orien Mayhall/Extender: Frann Rider in Treatment: 1 Encounter Discharge Information Items Discharge Pain Level: 0 Discharge Condition:  Stable Ambulatory Status: Ambulatory Discharge Destination: Home Transportation: Private Auto Accompanied By: self Schedule Follow-up Appointment: Yes Medication Reconciliation completed and provided to Patient/Care No Andrik Sandt: Provided on Clinical Summary of Care: 10/15/2016 Form Type Recipient Paper Patient WT Electronic Signature(s) Signed: 10/15/2016 9:41:29 AM By: Ruthine Dose Previous Signature: 10/15/2016 9:34:26 AM Version By: Montey Hora Entered By: Ruthine Dose on 10/15/2016 09:41:29 [place], [name] ([id number]) -------------------------------------------------------------------------------- Multi Wound Chart Details Patient Name: [name]. Date of Service: 10/15/2016 9:15 AM Medical Record Number: [id number] Patient Account Number: [id number] Date of Birth/Sex: November 30, 1940 (76 y.o. Male) Treating RN: Montey Hora Primary Care Jaidin Ugarte: Tedra Senegal Other Clinician: Referring Emilo Gras: Tedra Senegal Treating Niesha Bame/Extender: Frann Rider in Treatment: 1 Vital Signs Height(in): 71 Pulse(bpm): 55 Weight(lbs): 203 Blood Pressure 152/42 (mmHg): Body Mass Index(BMI): 28 Temperature(F): 98.1 Respiratory Rate 16 (breaths/min): Photos: [N/A:N/A] Wound Location: Abdomen - midline N/A N/A Wounding Event: Gradually Appeared N/A N/A Primary Etiology: To be determined N/A N/A Comorbid History: Anemia, Hypertension, N/A N/A Peripheral Venous Disease, Type II Diabetes, Gout, Neuropathy Date Acquired: 09/10/2016 N/A N/A Weeks of Treatment: 1 N/A N/A Wound Status: Open N/A N/A Measurements L x W x D 0.2x0.2x0.1 N/A N/A (cm) Area (cm) : 0.031 N/A N/A Volume (cm) : 0.003 N/A N/A % Reduction in Area: 83.50% N/A N/A % Reduction in Volume: 84.20% N/A N/A Classification: Partial Thickness N/A N/A Exudate Amount: Large N/A N/A Exudate Type: Serous N/A N/A Exudate Color: amber N/A N/A Wound Margin: Flat and Intact N/A N/A Granulation Amount:  Large (67-100%) N/A N/A Granulation Quality: Pink N/A N/A Pyka, [name] ([id number]) Necrotic Amount: None Present (0%) N/A N/A Exposed Structures: Fascia: No N/A N/A Fat Layer (Subcutaneous Tissue) Exposed: No Tendon: No Muscle: No Joint: No Bone: No Limited to Skin Breakdown Epithelialization: Small (1-33%) N/A N/A Periwound Skin Texture: Excoriation: No N/A N/A  Induration: No Callus: No Crepitus: No Rash: No Scarring: No Periwound Skin Maceration: No N/A N/A Moisture: Dry/Scaly: No Periwound Skin Color: Atrophie Blanche: No N/A N/A Cyanosis: No Ecchymosis: No Erythema: No Hemosiderin Staining: No Mottled: No Pallor: No Rubor: No Temperature: No Abnormality N/A N/A Tenderness on No N/A N/A Palpation: Wound Preparation: Ulcer Cleansing: N/A N/A Rinsed/Irrigated with Saline Topical Anesthetic Applied: Other: lidocaine 4% Treatment Notes Wound #1 (Abdomen - midline) 1. Cleansed with: Clean wound with Normal Saline 4. Dressing Applied: Prisma Ag 5. Secondary [place] Signature(s) RONDRICK, BARREIRA ([id number]) Signed: 10/15/2016 9:53:00 AM By: Christin Fudge MD, FACS Entered By: Christin Fudge on 10/15/2016 09:53:00 [place], [name] ([id number]) -------------------------------------------------------------------------------- [place] Details Patient Name: [name]. Date of Service: 10/15/2016 9:15 AM Medical Record Number: [id number] Patient Account Number: [id number] Date of Birth/Sex: 19-Sep-1941 (76 y.o. Male) Treating RN: Montey Hora Primary Care Stephenie Navejas: Tedra Senegal Other Clinician: Referring Thales Knipple: Tedra Senegal Treating Mckenna Boruff/Extender: Frann Rider in Treatment: 1 Active Inactive ` Abuse / Safety / Falls / Self Care Management Nursing Diagnoses: Potential for falls Goals: Patient will remain injury free Date Initiated: 10/08/2016 Target Resolution Date: 10/22/2016 Goal  Status: Active Interventions: Assess fall risk on admission and as needed Notes: ` Orientation to the Wound Care Program Nursing Diagnoses: Knowledge deficit related to the wound healing center program Goals: Patient/caregiver will verbalize understanding of the [facility] Program Date Initiated: 10/08/2016 Target Resolution Date: 10/22/2016 Goal Status: Active Interventions: Provide education on orientation to the wound center Notes: ` Wound/Skin Impairment Nursing Diagnoses: Impaired tissue integrity [place], [name] ([id number]) Goals: Patient/caregiver will verbalize understanding of skin care regimen Date Initiated: 10/08/2016 Target Resolution Date: 10/22/2016 Goal Status: Active Ulcer/skin breakdown will have a volume reduction of 30% by week 4 Date Initiated: 10/08/2016 Target Resolution Date: 10/22/2016 Goal Status: Active Ulcer/skin breakdown will have a volume reduction of 50% by week 8 Date Initiated: 10/08/2016 Target Resolution Date: 10/22/2016 Goal Status: Active Ulcer/skin breakdown will have a volume reduction of 80% by week 12 Date Initiated: 10/08/2016 Target Resolution Date: 10/22/2016 Goal Status: Active Ulcer/skin breakdown will heal within 14 weeks Date Initiated: 10/08/2016 Target Resolution Date: 10/22/2016 Goal Status: Active Interventions: Assess patient/caregiver ability to obtain necessary supplies Assess patient/caregiver ability to perform ulcer/skin care regimen upon admission and as needed Assess ulceration(s) every visit Notes: Electronic Signature(s) Signed: 10/15/2016 9:34:06 AM By: Montey Hora Entered By: Montey Hora on 10/15/2016 09:34:06 [place]an, [name] ([id number]) -------------------------------------------------------------------------------- Pain Assessment Details Patient Name: [name]. Date of Service: 10/15/2016 9:15 AM Medical Record Number: [id number] Patient Account Number: [id number] Date of Birth/Sex:  07/22/1941 (76 y.o. Male) Treating RN: Montey Hora Primary Care Jamonica Schoff: Tedra Senegal Other Clinician: Referring Avi Kerschner: Tedra Senegal Treating Audrey Eller/Extender: Frann Rider in Treatment: 1 Active Problems Location of Pain Severity and Description of Pain Patient Has Paino No Site Locations Pain Management and Medication Current Pain Management: Notes Topical or injectable lidocaine is offered to patient for acute pain when surgical debridement is performed. If needed, Patient is instructed to use over the counter pain medication for the following 24-48 hours after debridement. Wound care MDs do not prescribed pain medications. Patient has chronic pain or uncontrolled pain. Patient has been instructed to make an appointment with their Primary Care Physician for pain management. Electronic Signature(s) Signed: 10/15/2016 4:53:16 PM By: Montey Hora Entered By: Montey Hora on 10/15/2016 09:24:01 Tomaso, [name] ([id number]) -------------------------------------------------------------------------------- Patient/Caregiver Education Details Patient Name: [name].  Date of Service: 10/15/2016 9:15 AM Medical Record Number: [id number] Patient Account Number: [id number] Date of Birth/Gender: 05/08/1941 (76 y.o. Male) Treating RN: Montey Hora Primary Care Physician: Tedra Senegal Other Clinician: Referring Physician: Tedra Senegal Treating Physician/Extender: Frann Rider in Treatment: 1 Education Assessment Education Provided To: Patient Education Topics Provided Wound/Skin Impairment: Handouts: Other: wound care as ordered, after healed continue protecting that area Methods: Demonstration, Explain/Verbal Responses: State content correctly Electronic Signature(s) Signed: 10/15/2016 4:53:16 PM By: Montey Hora Entered By: Montey Hora on 10/15/2016 09:42:16 [place], [name]  ([id number]) -------------------------------------------------------------------------------- Wound Assessment Details Patient Name: [name]. Date of Service: 10/15/2016 9:15 AM Medical Record Number: [id number] Patient Account Number: [id number] Date of Birth/Sex: [birth year] (76 y.o. Male) Treating RN: Montey Hora Primary Care Lylianna Fraiser: Tedra Senegal Other Clinician: Referring Nohealani Medinger: Tedra Senegal Treating Carley Glendenning/Extender: Frann Rider in Treatment: 1 Wound Status Wound Number: 1 Primary To be determined Etiology: Wound Location: Abdomen - midline Wound Open Wounding Event: Gradually Appeared Status: Date Acquired: 09/10/2016 Comorbid Anemia, Hypertension, Peripheral Weeks Of Treatment: 1 History: Venous Disease, Type II Diabetes, Clustered Wound: No Gout, Neuropathy Photos Wound Measurements Length: (cm) 0.2 Width: (cm) 0.2 Depth: (cm) 0.1 Area: (cm) 0.031 Volume: (cm) 0.003 % Reduction in Area: 83.5% % Reduction in Volume: 84.2% Epithelialization: Small (1-33%) Tunneling: No Undermining: No Wound Description Classification: Partial Thickness Foul Odor After Wound Margin: Flat and Intact Exudate Amount: Large Exudate Type: Serous Exudate Color: amber Cleansing: No Wound Bed Granulation Amount: Large (67-100%) Exposed Structure Granulation Quality: Pink Fascia Exposed: No Necrotic Amount: None Present (0%) Fat Layer (Subcutaneous Tissue) Exposed: No Tendon Exposed: No Muscle Exposed: No [place]ug[place], [name] ([id number]) Joint Exposed: No Bone Exposed: No Limited to Skin Breakdown Periwound Skin Texture Texture Color No Abnormalities Noted: No No Abnormalities Noted: No Callus: No Atrophie Blanche: No Crepitus: No Cyanosis: No Excoriation: No Ecchymosis: No Induration: No Erythema: No Rash: No Hemosiderin Staining: No Scarring: No Mottled: No Pallor: No Moisture Rubor: No No Abnormalities Noted: No Dry / Scaly: No  Temperature / Pain Maceration: No Temperature: No Abnormality Wound Preparation Ulcer Cleansing: Rinsed/Irrigated with Saline Topical Anesthetic Applied: Other: lidocaine 4%, Treatment Notes Wound #1 (Abdomen - midline) 1. Cleansed with: Clean wound with Normal Saline 4. Dressing Applied: Prisma Ag 5. Secondary [place] Signature(s) Signed: 10/15/2016 4:53:16 PM By: Montey Hora Entered By: Montey Hora on 10/15/2016 09:36:36 [place], [name] ([id number]) -------------------------------------------------------------------------------- [place] Details Patient Name: [name]. Date of Service: 10/15/2016 9:15 AM Medical Record Number: [id number] Patient Account Number: [id number] Date of Birth/Sex: Feb 01, 1941 (76 y.o. Male) Treating RN: Montey Hora Primary Care Martena Emanuele: Tedra Senegal Other Clinician: Referring Marqui Formby: Tedra Senegal Treating Shalinda Burkholder/Extender: Frann Rider in Treatment: 1 Vital Signs Time Taken: 09:24 Temperature (F): 98.1 Height (in): 71 Pulse (bpm): 55 Weight (lbs): 203 Respiratory Rate (breaths/min): 16 Body Mass Index (BMI): 28.3 Blood Pressure (mmHg): 152/42 Reference Range: 80 - 120 mg / dl Electronic Signature(s) Signed: 10/15/2016 4:53:16 PM By: Montey Hora Entered By: Montey Hora on 10/15/2016 09:25:48

## 2016-10-16 NOTE — Progress Notes (Signed)
CHAYSON, CHARTERS ([id number]) Visit Report for 10/15/2016 Chief Complaint Document Details Patient Name: [name], [name]. Date of Service: 10/15/2016 9:15 AM Medical Record Number: [id number] Patient Account Number: [id number] Date of Birth/Sex: [birth year] (76 y.o. Male) Treating RN: Montey Hora Primary Care Provider: Tedra Senegal Other Clinician: Referring Provider: Tedra Senegal Treating Provider/Extender: Frann Rider in Treatment: 1 Information Obtained from: Patient Chief Complaint Patients presents for treatment of an open diabetic ulcer to the periumbilical area which she's had for about a month Electronic Signature(s) Signed: 10/15/2016 9:53:04 AM By: Christin Fudge MD, FACS Entered By: Christin Fudge on 10/15/2016 09:53:04 [name], [name] ([id number]) -------------------------------------------------------------------------------- HPI Details Patient Name: [name]. Date of Service: 10/15/2016 9:15 AM Medical Record Number: [id number] Patient Account Number: [id number] Date of Birth/Sex: [birth year] (76 y.o. Male) Treating RN: Montey Hora Primary Care Provider: Tedra Senegal Other Clinician: Referring Provider: Tedra Senegal Treating Provider/Extender: Frann Rider in Treatment: 1 History of Present Illness Location: periumbilical area Quality: Patient reports No Pain. Severity: Patient states wound (s) are getting better. 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 last hemoglobin A1c was 7.5%. status post AV fistula placement, breast surgery for left granulomatous mastitis, triple a repair in 1992, small bowel obstruction, upper arm angiograms for AV fistula on the left side. was noted to have a superficial skin wound in the periumbilical area since late December 2017. He was asked to clean it with hydrogen peroxide and apply Bactroban. He was seen by vascular surgery in October 2017 where his right ABI was 0.56 left ABI was 0.87 and right TBI was 0.69 and left TBI was 0.70 Electronic Signature(s) Signed: 10/15/2016 9:53:12 AM By: Christin Fudge MD, FACS Entered By: Christin Fudge on 10/15/2016 09:53:12 [name], [name] ([id number]) -------------------------------------------------------------------------------- Physical Exam Details Patient Name: [name]. Date of Service: 10/15/2016 9:15 AM Medical Record Number: [id number] Patient Account Number: [id number] Date of Birth/Sex: 1940-11-04 (76 y.o. Male) Treating RN: Montey Hora Primary Care Provider: Tedra Senegal Other Clinician: Referring Provider: Tedra Senegal Treating Provider/Extender: Frann Rider in Treatment: 1 Constitutional . Pulse regular. Respirations normal and unlabored. Afebrile. . Eyes Nonicteric. Reactive to light. Ears, Nose, Mouth, and Throat Lips, teeth, and gums WNL.Marland Kitchen Moist mucosa without lesions. Neck supple and nontender. No palpable supraclavicular or cervical adenopathy. Normal sized without goiter. Respiratory WNL. No retractions.. Breath sounds WNL, No rubs, rales, rhonchi, or wheeze.. Cardiovascular Heart rhythm and rate regular, no murmur or gallop.. Pedal Pulses WNL. No clubbing, cyanosis or edema. Chest Breasts symmetical and no nipple discharge.. Breast tissue WNL, no masses, lumps, or  tenderness.. Lymphatic No adneopathy. No adenopathy. No adenopathy. Musculoskeletal Adexa without tenderness or enlargement.. Digits and nails w/o clubbing, cyanosis, infection, petechiae, ischemia, or inflammatory conditions.. Integumentary (Hair, Skin) No suspicious lesions. No crepitus or fluctuance. No peri-wound warmth or erythema. No masses.Marland Kitchen Psychiatric Judgement and insight Intact.. No evidence of depression, anxiety, or agitation.. Notes the wound on the left of the midline has healed very nicely  and there is a minimal area which is still open and no sharp debridement was required today. Electronic Signature(s) Signed: 10/15/2016 9:53:35 AM By: Christin Fudge MD, FACS Entered By: Christin Fudge on 10/15/2016 09:53:35 [name], [name] ([id number]) -------------------------------------------------------------------------------- Physician Orders Details Patient Name: [name]. Date of Service: 10/15/2016 9:15 AM Medical Record Number: [id number] Patient Account Number: [id number] Date of Birth/Sex: 11/05/1940 (76 y.o. Male) Treating RN: Montey Hora Primary Care Provider: Tedra Senegal Other Clinician: Referring Provider: Tedra Senegal Treating Provider/Extender: Frann Rider in Treatment: 1 Verbal / Phone Orders: Yes Clinician: Montey Hora Read Back and Verified: Yes Diagnosis Coding Wound Cleansing Wound #1 Abdomen - midline o Clean wound with Normal Saline. Anesthetic Wound #1 Abdomen - midline o Topical Lidocaine 4% cream applied to wound bed prior to debridement Primary Wound Dressing Wound #1 Abdomen - midline o Prisma Ag Secondary Dressing Wound #1 Abdomen - midline o Boardered Foam Dressing - or telfa island Dressing Change Frequency Wound #1 Abdomen - midline o Change dressing every day. Follow-up Appointments Wound #1 Abdomen - midline o Return Appointment in 1 week. Additional Orders / Instructions Wound #1 Abdomen -  midline o Other: - please keep your clothing from rubbing on this area Medications-please add to medication list. Wound #1 Abdomen - midline o Other: - please add vitamin c, vitamin a and zinc supplements to your diet Electronic Signature(s) Yeagley, [name] ([id number]) Signed: 10/15/2016 4:24:55 PM By: Christin Fudge MD, FACS Signed: 10/15/2016 4:53:16 PM By: Montey Hora Entered By: Montey Hora on 10/15/2016 09:40:24 [name], [name] ([id number]) -------------------------------------------------------------------------------- Problem List Details Patient Name: [name]. Date of Service: 10/15/2016 9:15 AM Medical Record Number: [id number] Patient Account Number: [id number] Date of Birth/Sex: 1941/03/03 (76 y.o. Male) Treating RN: Montey Hora Primary Care Provider: Tedra Senegal Other Clinician: Referring Provider: Tedra Senegal Treating Provider/Extender: Frann Rider in Treatment: 1 Active Problems ICD-10 Encounter Code Description Active Date Diagnosis E11.622 Type 2 diabetes mellitus with other skin ulcer 10/08/2016 Yes S31.102A Unspecified open wound of abdominal wall, epigastric 10/08/2016 Yes region without penetration into peritoneal cavity, initial encounter Inactive Problems Resolved Problems Electronic Signature(s) Signed: 10/15/2016 9:52:54 AM By: Christin Fudge MD, FACS Entered By: Christin Fudge on 10/15/2016 09:52:54 [name], [name] ([id number]) -------------------------------------------------------------------------------- Progress Note Details Patient Name: [name]. Date of Service: 10/15/2016 9:15 AM Medical Record Number: [id number] Patient Account Number: [id number] Date of Birth/Sex: 04/03/1941 (76 y.o. Male) Treating RN: Montey Hora Primary Care Provider: Tedra Senegal Other Clinician: Referring Provider: Tedra Senegal Treating Provider/Extender: Frann Rider in Treatment: 1 Subjective Chief Complaint Information  obtained from Patient Patients presents for treatment of an open diabetic ulcer to the periumbilical area which she's had for about a month History of Present Illness (HPI) The following HPI elements were documented for the patient's wound: Location: periumbilical area Quality: Patient reports No Pain. Severity: Patient states wound (s) are getting better. Duration: Patient has had the wound for < 4 weeks prior to presenting for treatment Context: The wound appeared gradually over time Modifying Factors: Patient is currently on renal dialysis and receives treatments 3 times weekly Associated Signs and Symptoms: Patient reports having:then local care with hydrogen peroxide and Bactroban ointment The 76 year old gentleman who has a past medical history of diabetes mellitus, end-stage renal disease on dialysis, hypertension, secondary hyperparathyroidism, peripheral vascular disease and history of previous arterial bifemoral bypass graft in 1992 also has a history of gout, hyperlipidemia, hypothyroidism. last hemoglobin A1c was 7.5%. status post AV fistula  placement, breast surgery for left granulomatous mastitis, triple a repair in 1992, small bowel obstruction, upper arm angiograms for AV fistula on the left side. was noted to have a superficial skin wound in the periumbilical area since late December 2017. He was asked to clean it with hydrogen peroxide and apply Bactroban. He was seen by vascular surgery in October 2017 where his right ABI was 0.56 left ABI was 0.87 and right TBI was 0.69 and left TBI was 0.70 Objective Constitutional Pulse regular. Respirations normal and unlabored. Afebrile. Vitals Time Taken: 9:24 AM, Height: 71 in, Weight: 203 lbs, BMI: 28.3, Temperature: 98.1 F, Pulse: 55 bpm, Respiratory Rate: 16 breaths/min, Blood Pressure: 152/42 mmHg. [name], [name] ([id number]) Eyes Nonicteric. Reactive to light. Ears, Nose, Mouth, and Throat Lips, teeth, and gums WNL.Marland Kitchen  Moist mucosa without lesions. Neck supple and nontender. No palpable supraclavicular or cervical adenopathy. Normal sized without goiter. Respiratory WNL. No retractions.. Breath sounds WNL, No rubs, rales, rhonchi, or wheeze.. Cardiovascular Heart rhythm and rate regular, no murmur or gallop.. Pedal Pulses WNL. No clubbing, cyanosis or edema. Chest Breasts symmetical and no nipple discharge.. Breast tissue WNL, no masses, lumps, or tenderness.. Lymphatic No adneopathy. No adenopathy. No adenopathy. Musculoskeletal Adexa without tenderness or enlargement.. Digits and nails w/o clubbing, cyanosis, infection, petechiae, ischemia, or inflammatory conditions.Marland Kitchen Psychiatric Judgement and insight Intact.. No evidence of depression, anxiety, or agitation.. General Notes: the wound on the left of the midline has healed very nicely and there is a minimal area which is still open and no sharp debridement was required today. Integumentary (Hair, Skin) No suspicious lesions. No crepitus or fluctuance. No peri-wound warmth or erythema. No masses.. Wound #1 status is Open. Original cause of wound was Gradually Appeared. The wound is located on the Abdomen - midline. The wound measures 0.2cm length x 0.2cm width x 0.1cm depth; 0.031cm^2 area and 0.003cm^3 volume. The wound is limited to skin breakdown. There is no tunneling or undermining noted. There is a large amount of serous drainage noted. The wound margin is flat and intact. There is large (67- 100%) pink granulation within the wound bed. There is no necrotic tissue within the wound bed. The periwound skin appearance did not exhibit: Callus, Crepitus, Excoriation, Induration, Rash, Scarring, Dry/Scaly, Maceration, Atrophie Blanche, Cyanosis, Ecchymosis, Hemosiderin Staining, Mottled, Pallor, Rubor, Erythema. Periwound temperature was noted as No Abnormality. [name], [name] ([id number]) Assessment Active Problems ICD-10 E11.622 - Type 2  diabetes mellitus with other skin ulcer S31.102A - Unspecified open wound of abdominal wall, epigastric region without penetration into peritoneal cavity, initial encounter Plan Wound Cleansing: Wound #1 Abdomen - midline: Clean wound with Normal Saline. Anesthetic: Wound #1 Abdomen - midline: Topical Lidocaine 4% cream applied to wound bed prior to debridement Primary Wound Dressing: Wound #1 Abdomen - midline: Prisma Ag Secondary Dressing: Wound #1 Abdomen - midline: Boardered Foam Dressing - or telfa island Dressing Change Frequency: Wound #1 Abdomen - midline: Change dressing every day. Follow-up Appointments: Wound #1 Abdomen - midline: Return Appointment in 1 week. Additional Orders / Instructions: Wound #1 Abdomen - midline: Other: - please keep your clothing from rubbing on this area Medications-please add to medication list.: Wound #1 Abdomen - midline: Other: - please add vitamin c, vitamin a and zinc supplements to your diet I have recommended: [name], [name] ([id number]) 1. Cleaning soap and water, applying Prisma AG and bordered foam and have discussed offloading this in great detail 2. Good control of his diabetes mellitus, adequate protein, vitamin  A, vitamin C and zinc 3. Regular visits to the wound center He has all his questions answered and will be seen back next week Electronic Signature(s) Signed: 10/15/2016 9:53:56 AM By: Christin Fudge MD, FACS Entered By: Christin Fudge on 10/15/2016 09:53:56 [name], [name] ([id number]) -------------------------------------------------------------------------------- SuperBill Details Patient Name: [name]. Date of Service: 10/15/2016 Medical Record Number: [id number] Patient Account Number: [id number] Date of Birth/Sex: 1941/02/11 (76 y.o. Male) Treating RN: Montey Hora Primary Care Provider: Tedra Senegal Other Clinician: Referring Provider: Tedra Senegal Treating Provider/Extender: Frann Rider  in Treatment: 1 Diagnosis Coding ICD-10 Codes Code Description [phone] Type 2 diabetes mellitus with other skin ulcer Unspecified open wound of abdominal wall, epigastric region without penetration into S31.102A peritoneal cavity, initial encounter Facility Procedures CPT4 Code: [id number] Description: [phone] - WOUND CARE VISIT-LEV 2 EST PT Modifier: Quantity: 1 Physician Procedures CPT4: Description Modifier Quantity Code [id number] - WC PHYS LEVEL 3 - EST PT 1 ICD-10 Description Diagnosis E11.622 Type 2 diabetes mellitus with other skin ulcer S31.102A Unspecified open wound of abdominal wall, epigastric region without  penetration into peritoneal cavity, initial encounter Electronic Signature(s) Signed: 10/15/2016 9:54:06 AM By: Christin Fudge MD, FACS Entered By: Christin Fudge on 10/15/2016 09:54:06

## 2016-10-17 DIAGNOSIS — D631 Anemia in chronic kidney disease: Secondary | ICD-10-CM | POA: Diagnosis not present

## 2016-10-17 DIAGNOSIS — Z23 Encounter for immunization: Secondary | ICD-10-CM | POA: Diagnosis not present

## 2016-10-17 DIAGNOSIS — N2581 Secondary hyperparathyroidism of renal origin: Secondary | ICD-10-CM | POA: Diagnosis not present

## 2016-10-17 DIAGNOSIS — N186 End stage renal disease: Secondary | ICD-10-CM | POA: Diagnosis not present

## 2016-10-17 DIAGNOSIS — E1129 Type 2 diabetes mellitus with other diabetic kidney complication: Secondary | ICD-10-CM | POA: Diagnosis not present

## 2016-10-17 DIAGNOSIS — D509 Iron deficiency anemia, unspecified: Secondary | ICD-10-CM | POA: Diagnosis not present

## 2016-10-19 DIAGNOSIS — D509 Iron deficiency anemia, unspecified: Secondary | ICD-10-CM | POA: Diagnosis not present

## 2016-10-19 DIAGNOSIS — E1129 Type 2 diabetes mellitus with other diabetic kidney complication: Secondary | ICD-10-CM | POA: Diagnosis not present

## 2016-10-19 DIAGNOSIS — N2581 Secondary hyperparathyroidism of renal origin: Secondary | ICD-10-CM | POA: Diagnosis not present

## 2016-10-19 DIAGNOSIS — Z23 Encounter for immunization: Secondary | ICD-10-CM | POA: Diagnosis not present

## 2016-10-19 DIAGNOSIS — N186 End stage renal disease: Secondary | ICD-10-CM | POA: Diagnosis not present

## 2016-10-19 DIAGNOSIS — D631 Anemia in chronic kidney disease: Secondary | ICD-10-CM | POA: Diagnosis not present

## 2016-10-22 ENCOUNTER — Encounter: Payer: Medicare Other | Admitting: Surgery

## 2016-10-22 DIAGNOSIS — N2581 Secondary hyperparathyroidism of renal origin: Secondary | ICD-10-CM | POA: Diagnosis not present

## 2016-10-22 DIAGNOSIS — E11622 Type 2 diabetes mellitus with other skin ulcer: Secondary | ICD-10-CM | POA: Diagnosis not present

## 2016-10-22 DIAGNOSIS — D509 Iron deficiency anemia, unspecified: Secondary | ICD-10-CM | POA: Diagnosis not present

## 2016-10-22 DIAGNOSIS — I120 Hypertensive chronic kidney disease with stage 5 chronic kidney disease or end stage renal disease: Secondary | ICD-10-CM | POA: Diagnosis not present

## 2016-10-22 DIAGNOSIS — L98499 Non-pressure chronic ulcer of skin of other sites with unspecified severity: Secondary | ICD-10-CM | POA: Diagnosis not present

## 2016-10-22 DIAGNOSIS — S31102A Unspecified open wound of abdominal wall, epigastric region without penetration into peritoneal cavity, initial encounter: Secondary | ICD-10-CM | POA: Diagnosis not present

## 2016-10-22 DIAGNOSIS — E1122 Type 2 diabetes mellitus with diabetic chronic kidney disease: Secondary | ICD-10-CM | POA: Diagnosis not present

## 2016-10-22 DIAGNOSIS — Z992 Dependence on renal dialysis: Secondary | ICD-10-CM | POA: Diagnosis not present

## 2016-10-22 DIAGNOSIS — D631 Anemia in chronic kidney disease: Secondary | ICD-10-CM | POA: Diagnosis not present

## 2016-10-22 DIAGNOSIS — Z23 Encounter for immunization: Secondary | ICD-10-CM | POA: Diagnosis not present

## 2016-10-22 DIAGNOSIS — E1129 Type 2 diabetes mellitus with other diabetic kidney complication: Secondary | ICD-10-CM | POA: Diagnosis not present

## 2016-10-22 DIAGNOSIS — N186 End stage renal disease: Secondary | ICD-10-CM | POA: Diagnosis not present

## 2016-10-22 NOTE — Progress Notes (Signed)
[name], [name] ([id number]) Visit Report for 10/22/2016 Chief Complaint Document Details Patient Name: [name], [name]. Date of Service: 10/22/2016 9:15 AM Medical Record Number: [id number] Patient Account Number: [id number] Date of Birth/Sex: 16-Nov-1940 (76 y.o. Male) Treating RN: Montey Hora Primary Care Provider: Tedra Senegal Other Clinician: Referring Provider: Tedra Senegal Treating Provider/Extender: Frann Rider in Treatment: 2 Information Obtained from: Patient Chief Complaint Patients presents for treatment of an open diabetic ulcer to the periumbilical area which she's had for about a month Electronic Signature(s) Signed: 10/22/2016 10:00:43 AM By: Christin Fudge MD, FACS Entered By: Christin Fudge on 10/22/2016 10:00:43 [name], [name] ([id number]) -------------------------------------------------------------------------------- HPI Details Patient Name: [name]. Date of Service: 10/22/2016 9:15 AM Medical Record Number: [id number] Patient Account Number: [id number] Date of Birth/Sex: [birth year] (76 y.o. Male) Treating RN: Montey Hora Primary Care Provider: Tedra Senegal Other Clinician: Referring Provider: Tedra Senegal Treating Provider/Extender: Frann Rider in Treatment: 2 History of Present Illness Location: periumbilical area Quality: Patient reports No Pain. Severity: Patient states wound (s) are getting better. 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 last hemoglobin A1c was 7.5%. status post AV fistula placement, breast surgery for left granulomatous mastitis, triple a repair in 1992, small bowel obstruction, upper arm angiograms for AV fistula on the left side. was noted to have a superficial skin wound in the periumbilical area since late December 2017. He was asked to clean it with hydrogen peroxide and apply Bactroban. He was seen by vascular surgery in October 2017 where his right ABI was 0.56 left ABI was 0.87 and right TBI was 0.69 and left TBI was 0.70 Electronic Signature(s) Signed: 10/22/2016 10:00:46 AM By: Christin Fudge MD, FACS Entered By: Christin Fudge on 10/22/2016 10:00:46 [name], [name] ([id number]) -------------------------------------------------------------------------------- Physical Exam Details Patient Name: [name]. Date of Service: 10/22/2016 9:15 AM Medical Record Number: [id number] Patient Account Number: [id number] Date of Birth/Sex: November 16, 1940 (76 y.o. Male) Treating RN: Montey Hora Primary Care Provider: Tedra Senegal Other Clinician: Referring Provider: Tedra Senegal Treating Provider/Extender: Frann Rider in Treatment: 2 Constitutional . Pulse regular. Respirations normal and unlabored. Afebrile. . Eyes Nonicteric. Reactive to light. Ears, Nose, Mouth, and Throat Lips, teeth, and gums WNL.Marland Kitchen Moist mucosa without lesions. Neck supple and nontender. No palpable supraclavicular or cervical adenopathy. Normal sized without goiter. Respiratory WNL. No retractions.. Breath sounds WNL, No rubs, rales, rhonchi, or wheeze.. Cardiovascular Heart rhythm and rate regular, no murmur or gallop.. Pedal Pulses WNL. No clubbing, cyanosis or edema. Chest Breasts symmetical and no nipple discharge.. Breast tissue WNL, no masses, lumps, or  tenderness.. Lymphatic No adneopathy. No adenopathy. No adenopathy. Musculoskeletal Adexa without tenderness or enlargement.. Digits and nails w/o clubbing, cyanosis, infection, petechiae, ischemia, or inflammatory conditions.. Integumentary (Hair, Skin) No suspicious lesions. No crepitus or fluctuance. No peri-wound warmth or erythema. No masses.Marland Kitchen Psychiatric Judgement and insight Intact.. No evidence of depression, anxiety, or agitation.. Notes the wound on the abdomen wall has healed completely and there is  a supple scar. Electronic Signature(s) Signed: 10/22/2016 10:01:34 AM By: Christin Fudge MD, FACS Entered By: Christin Fudge on 10/22/2016 10:01:33 [name], [name] ([id number]) -------------------------------------------------------------------------------- Physician Orders Details Patient Name: [name]. Date of Service: 10/22/2016 9:15 AM Medical Record Number: [id number] Patient Account Number: [id number] Date of Birth/Sex: 25-Jul-1941 (76 y.o. Male) Treating RN: Montey Hora Primary Care Provider: Tedra Senegal Other Clinician: Referring Provider: Tedra Senegal Treating Provider/Extender: Frann Rider in Treatment: 2 Verbal / Phone Orders: Yes Clinician: Montey Hora Read Back and Verified: Yes Diagnosis Coding Discharge From [facility] Services o Discharge from [facility] Signature(s) Signed: 10/22/2016 10:01:42 AM By: Christin Fudge MD, FACS Entered By: Christin Fudge on 10/22/2016 10:01:42 [name], [name] ([id number]) -------------------------------------------------------------------------------- Problem List Details Patient Name: [name]. Date of Service: 10/22/2016 9:15 AM Medical Record Number: [id number] Patient Account Number: [id number] Date of Birth/Sex: 1940-11-28 (76 y.o. Male) Treating RN: Montey Hora Primary Care Provider: Tedra Senegal Other Clinician: Referring Provider: Tedra Senegal Treating Provider/Extender:  Frann Rider in Treatment: 2 Active Problems ICD-10 Encounter Code Description Active Date Diagnosis E11.622 Type 2 diabetes mellitus with other skin ulcer 10/08/2016 Yes S31.102A Unspecified open wound of abdominal wall, epigastric 10/08/2016 Yes region without penetration into peritoneal cavity, initial encounter Inactive Problems Resolved Problems Electronic Signature(s) Signed: 10/22/2016 10:00:24 AM By: Christin Fudge MD, FACS Entered By: Christin Fudge on 10/22/2016 10:00:24 [name], [name] ([id number]) -------------------------------------------------------------------------------- Progress Note Details Patient Name: [name]. Date of Service: 10/22/2016 9:15 AM Medical Record Number: [id number] Patient Account Number: [id number] Date of Birth/Sex: 1941-09-18 (76 y.o. Male) Treating RN: Montey Hora Primary Care Provider: Tedra Senegal Other Clinician: Referring Provider: Tedra Senegal Treating Provider/Extender: Frann Rider in Treatment: 2 Subjective Chief Complaint Information obtained from Patient Patients presents for treatment of an open diabetic ulcer to the periumbilical area which she's had for about a month History of Present Illness (HPI) The following HPI elements were documented for the patient's wound: Location: periumbilical area Quality: Patient reports No Pain. Severity: Patient states wound (s) are getting better. Duration: Patient has had the wound for < 4 weeks prior to presenting for treatment Context: The wound appeared gradually over time Modifying Factors: Patient is currently on renal dialysis and receives treatments 3 times weekly Associated Signs and Symptoms: Patient reports having:then local care with hydrogen peroxide and Bactroban ointment The 76 year old gentleman who has a past medical history of diabetes mellitus, end-stage renal disease on dialysis, hypertension, secondary hyperparathyroidism, peripheral vascular  disease and history of previous arterial bifemoral bypass graft in 1992 also has a history of gout, hyperlipidemia, hypothyroidism. last hemoglobin A1c was 7.5%. status post AV fistula placement, breast surgery for left granulomatous mastitis, triple a repair in 1992, small bowel obstruction, upper arm angiograms for AV fistula on the left side. was noted to have a superficial skin wound in the periumbilical area since late December 2017. He was asked to clean it with hydrogen peroxide and apply Bactroban. He was seen by vascular surgery in October 2017 where his right ABI was 0.56 left ABI was 0.87 and right TBI was 0.69 and left TBI was 0.70 Objective Constitutional Pulse regular. Respirations normal and unlabored. Afebrile. Vitals Time Taken: 9:21 AM, Height: 71 in, Weight: 203 lbs, BMI: 28.3, Temperature: 98.2 F, Pulse: 57 bpm, Respiratory Rate: 16 breaths/min, Blood Pressure: 151/43 mmHg. [name], [name] ([id number]) Eyes Nonicteric. Reactive to light. Ears, Nose, Mouth, and Throat Lips, teeth, and gums WNL.Marland Kitchen Moist mucosa without lesions. Neck supple and  nontender. No palpable supraclavicular or cervical adenopathy. Normal sized without goiter. Respiratory WNL. No retractions.. Breath sounds WNL, No rubs, rales, rhonchi, or wheeze.. Cardiovascular Heart rhythm and rate regular, no murmur or gallop.. Pedal Pulses WNL. No clubbing, cyanosis or edema. Chest Breasts symmetical and no nipple discharge.. Breast tissue WNL, no masses, lumps, or tenderness.. Lymphatic No adneopathy. No adenopathy. No adenopathy. Musculoskeletal Adexa without tenderness or enlargement.. Digits and nails w/o clubbing, cyanosis, infection, petechiae, ischemia, or inflammatory conditions.Marland Kitchen Psychiatric Judgement and insight Intact.. No evidence of depression, anxiety, or agitation.. General Notes: the wound on the abdomen wall has healed completely and there is a supple scar. Integumentary (Hair,  Skin) No suspicious lesions. No crepitus or fluctuance. No peri-wound warmth or erythema. No masses.. Wound #1 status is Healed - Epithelialized. Original cause of wound was Gradually Appeared. The wound is located on the Abdomen - midline. The wound measures 0cm length x 0cm width x 0cm depth; 0cm^2 area and 0cm^3 volume. The wound is limited to skin breakdown. There is no tunneling or undermining noted. There is a none present amount of drainage noted. The wound margin is flat and intact. There is no granulation within the wound bed. There is no necrotic tissue within the wound bed. The periwound skin appearance did not exhibit: Callus, Crepitus, Excoriation, Induration, Rash, Scarring, Dry/Scaly, Maceration, Atrophie Blanche, Cyanosis, Ecchymosis, Hemosiderin Staining, Mottled, Pallor, Rubor, Erythema. Periwound temperature was noted as No Abnormality. Assessment [name], [name] ([id number]) Active Problems ICD-10 E11.622 - Type 2 diabetes mellitus with other skin ulcer S31.102A - Unspecified open wound of abdominal wall, epigastric region without penetration into peritoneal cavity, initial encounter Plan Discharge From [facility] Services: Discharge from [facility] The wound is completely healed and I have asked him to protect the supple scar with a foam border for the next couple of weeks and make sure his pant belt does not cause any abrasion. His discharge from the wound care services and will only be seen back if need Electronic Signature(s) Signed: 10/22/2016 10:02:43 AM By: Christin Fudge MD, FACS Entered By: Christin Fudge on 10/22/2016 10:02:43 [name], [name] ([id number]) -------------------------------------------------------------------------------- SuperBill Details Patient Name: [name]. Date of Service: 10/22/2016 Medical Record Number: [id number] Patient Account Number: [id number] Date of Birth/Sex: [birth year] (76 y.o. Male) Treating RN: Montey Hora Primary  Care Provider: Tedra Senegal Other Clinician: Referring Provider: Tedra Senegal Treating Provider/Extender: Frann Rider in Treatment: 2 Diagnosis Coding ICD-10 Codes Code Description [phone] Type 2 diabetes mellitus with other skin ulcer Unspecified open wound of abdominal wall, epigastric region without penetration into S31.102A peritoneal cavity, initial encounter Facility Procedures CPT4 Code: [id number] Description: [phone] - WOUND CARE VISIT-LEV 2 EST PT Modifier: Quantity: 1 Physician Procedures CPT4: Description Modifier Quantity Code [id number] - WC PHYS LEVEL 2 - EST PT 1 ICD-10 Description Diagnosis S31.102A Unspecified open wound of abdominal wall, epigastric region without penetration into peritoneal cavity, initial encounter E11.622  Type 2 diabetes mellitus with other skin ulcer Electronic Signature(s) Signed: 10/22/2016 10:02:52 AM By: Christin Fudge MD, FACS Entered By: Christin Fudge on 10/22/2016 10:02:52

## 2016-10-23 NOTE — Progress Notes (Signed)
Johnny, [name] ([id number]) Visit Report for 10/22/2016 Arrival Information Details Patient Name: [name], [name]. Date of Service: 10/22/2016 9:15 AM Medical Record Number: [id number] Patient Account Number: [id number] Date of Birth/Sex: [birth year] (76 y.o. Male) Treating RN: Montey Hora Primary Care Markees Carns: Tedra Senegal Other Clinician: Referring Jabriel Vanduyne: Tedra Senegal Treating Zaelynn Fuchs/Extender: Frann Rider in Treatment: 2 Visit Information History Since Last Visit Added or deleted any medications: No Patient Arrived: Ambulatory Any new allergies or adverse reactions: No Arrival Time: 09:20 Had a fall or experienced change in No Accompanied By: self activities of daily living that may affect Transfer Assistance: None risk of falls: Patient Identification Verified: Yes Signs or symptoms of abuse/neglect since last No Secondary Verification Process Yes visito Completed: Hospitalized since last visit: No Patient Has Alerts: Yes Has Dressing in Place as Prescribed: Yes Patient Alerts: DMII Pain Present Now: No aspirin 650 Electronic Signature(s) Signed: 10/22/2016 4:46:17 PM By: Montey Hora Entered By: Montey Hora on 10/22/2016 09:20:39 [name], [name] ([id number]) -------------------------------------------------------------------------------- Clinic Level of Care Assessment Details Patient Name: [name]. Date of Service: 10/22/2016 9:15 AM Medical Record Number: [id number] Patient Account Number: [id number] Date of Birth/Sex: 1941-06-19 (76 y.o. Male) Treating RN: Montey Hora Primary Care Daunte Oestreich: Tedra Senegal Other Clinician: Referring [name]: Tedra Senegal Treating Shirrell Solinger/Extender: Frann Rider in Treatment: 2 Clinic Level of Care Assessment Items TOOL 4 Quantity Score []  - Use when only an EandM is performed on FOLLOW-UP visit 0 ASSESSMENTS - Nursing Assessment / Reassessment X - Reassessment of Co-morbidities (includes  updates in patient status) 1 10 X - Reassessment of Adherence to Treatment Plan 1 5 ASSESSMENTS - Wound and Skin Assessment / Reassessment X - Simple Wound Assessment / Reassessment - one wound 1 5 []  - Complex Wound Assessment / Reassessment - multiple wounds 0 []  - Dermatologic / Skin Assessment (not related to wound area) 0 ASSESSMENTS - Focused Assessment []  - Circumferential Edema Measurements - multi extremities 0 []  - Nutritional Assessment / Counseling / Intervention 0 []  - Lower Extremity Assessment (monofilament, tuning fork, pulses) 0 []  - Peripheral Arterial Disease Assessment (using hand held doppler) 0 ASSESSMENTS - Ostomy and/or Continence Assessment and Care []  - Incontinence Assessment and Management 0 []  - Ostomy Care Assessment and Management (repouching, etc.) 0 PROCESS - Coordination of Care X - Simple Patient / Family Education for ongoing care 1 15 []  - Complex (extensive) Patient / Family Education for ongoing care 0 []  - Staff obtains Programmer, systems, Records, Test Results / Process Orders 0 []  - Staff telephones HHA, Nursing Homes / Clarify orders / etc 0 []  - Routine Transfer to another Facility (non-emergent condition) 0 [name], [name] ([id number]) []  - Routine Hospital Admission (non-emergent condition) 0 []  - New Admissions / Insurance Authorizations / Ordering NPWT, Apligraf, etc. 0 []  - Emergency Hospital Admission (emergent condition) 0 X - Simple Discharge Coordination 1 10 []  - Complex (extensive) Discharge Coordination 0 PROCESS - Special Needs []  - Pediatric / Minor Patient Management 0 []  - Isolation Patient Management 0 []  - Hearing / Language / Visual special needs 0 []  - Assessment of Community assistance (transportation, D/C planning, etc.) 0 []  - Additional assistance / Altered mentation 0 []  - Support Surface(s) Assessment (bed, cushion, seat, etc.) 0 INTERVENTIONS - Wound Cleansing / Measurement X - Simple Wound Cleansing - one wound 1 5 []  -  Complex Wound Cleansing - multiple wounds 0 X - Wound Imaging (photographs - any number of wounds) 1 5 []  - Wound  Tracing (instead of photographs) 0 X - Simple Wound Measurement - one wound 1 5 []  - Complex Wound Measurement - multiple wounds 0 INTERVENTIONS - Wound Dressings []  - Small Wound Dressing one or multiple wounds 0 []  - Medium Wound Dressing one or multiple wounds 0 []  - Large Wound Dressing one or multiple wounds 0 []  - Application of Medications - topical 0 []  - Application of Medications - injection 0 INTERVENTIONS - Miscellaneous []  - External ear exam 0 [name], [name] ([id number]) []  - Specimen Collection (cultures, biopsies, blood, body fluids, etc.) 0 []  - Specimen(s) / Culture(s) sent or taken to Lab for analysis 0 []  - Patient Transfer (multiple staff / Harrel Lemon Lift / Similar devices) 0 []  - Simple Staple / Suture removal (25 or less) 0 []  - Complex Staple / Suture removal (26 or more) 0 []  - Hypo / Hyperglycemic Management (close monitor of Blood Glucose) 0 []  - Ankle / Brachial Index (ABI) - do not check if billed separately 0 X - Vital Signs 1 5 Has the patient been seen at the hospital within the last three years: Yes Total Score: 65 Level Of Care: New/Established - Level 2 Electronic Signature(s) Signed: 10/22/2016 4:46:17 PM By: Montey Hora Entered By: Montey Hora on 10/22/2016 09:50:22 [name], [name] ([id number]) -------------------------------------------------------------------------------- Encounter Discharge Information Details Patient Name: [name]. Date of Service: 10/22/2016 9:15 AM Medical Record Number: [id number] Patient Account Number: [id number] Date of Birth/Sex: 1941/03/28 (76 y.o. Male) Treating RN: Montey Hora Primary Care Carollee Nussbaumer: Tedra Senegal Other Clinician: Referring Emryn Flanery: Tedra Senegal Treating Oaklen Thiam/Extender: Frann Rider in Treatment: 2 Encounter Discharge Information Items Discharge Pain Level:  0 Discharge Condition: Stable Ambulatory Status: Ambulatory Discharge Destination: Home Transportation: Private Auto Accompanied By: self Schedule Follow-up Appointment: No Medication Reconciliation completed and provided to Patient/Care No Trixie Maclaren: Provided on Clinical Summary of Care: 10/22/2016 Form Type Recipient Paper Patient WT Electronic Signature(s) Signed: 10/22/2016 9:52:10 AM By: Ruthine Dose Entered By: Ruthine Dose on 10/22/2016 09:52:10 Reali, [name] ([id number]) -------------------------------------------------------------------------------- Multi Wound Chart Details Patient Name: [name]. Date of Service: 10/22/2016 9:15 AM Medical Record Number: [id number] Patient Account Number: [id number] Date of Birth/Sex: 11-14-1940 (76 y.o. Male) Treating RN: Montey Hora Primary Care Calisa Luckenbaugh: Tedra Senegal Other Clinician: Referring Beckett Hickmon: Tedra Senegal Treating Amor Packard/Extender: Frann Rider in Treatment: 2 Vital Signs Height(in): 71 Pulse(bpm): 57 Weight(lbs): 203 Blood Pressure 151/43 (mmHg): Body Mass Index(BMI): 28 Temperature(F): 98.2 Respiratory Rate 16 (breaths/min): Photos: [N/A:N/A] Wound Location: Abdomen - midline N/A N/A Wounding Event: Gradually Appeared N/A N/A Primary Etiology: Pressure Ulcer N/A N/A Comorbid History: Anemia, Hypertension, N/A N/A Peripheral Venous Disease, Type II Diabetes, Gout, Neuropathy Date Acquired: 09/10/2016 N/A N/A Weeks of Treatment: 2 N/A N/A Wound Status: Healed - Epithelialized N/A N/A Measurements L x W x D 0x0x0 N/A N/A (cm) Area (cm) : 0 N/A N/A Volume (cm) : 0 N/A N/A % Reduction in Area: 100.00% N/A N/A % Reduction in Volume: 100.00% N/A N/A Classification: Category/Stage II N/A N/A Exudate Amount: None Present N/A N/A Wound Margin: Flat and Intact N/A N/A Granulation Amount: None Present (0%) N/A N/A Necrotic Amount: None Present (0%) N/A N/A Exposed  Structures: Fascia: No N/A N/A Fat Layer (Subcutaneous Luttrull, [name] ([id number]) Tissue) Exposed: No Tendon: No Muscle: No Joint: No Bone: No Limited to Skin Breakdown Epithelialization: Large (67-100%) N/A N/A Periwound Skin Texture: Excoriation: No N/A N/A Induration: No Callus: No Crepitus: No Rash: No Scarring: No Periwound Skin Maceration: No N/A  N/A Moisture: Dry/Scaly: No Periwound Skin Color: Atrophie Blanche: No N/A N/A Cyanosis: No Ecchymosis: No Erythema: No Hemosiderin Staining: No Mottled: No Pallor: No Rubor: No Temperature: No Abnormality N/A N/A Tenderness on No N/A N/A Palpation: Wound Preparation: Ulcer Cleansing: N/A N/A Rinsed/Irrigated with Saline Topical Anesthetic Applied: None Treatment Notes Electronic Signature(s) Signed: 10/22/2016 10:00:29 AM By: Christin Fudge MD, FACS Entered By: Christin Fudge on 10/22/2016 10:00:29 Speas, [name] ([id number]) -------------------------------------------------------------------------------- [place] Details Patient Name: [name]. Date of Service: 10/22/2016 9:15 AM Medical Record Number: [id number] Patient Account Number: [id number] Date of Birth/Sex: 1941/04/04 (76 y.o. Male) Treating RN: Montey Hora Primary Care Aza Dantes: Tedra Senegal Other Clinician: Referring Majour Frei: Tedra Senegal Treating Cypress Hinkson/Extender: Frann Rider in Treatment: 2 Active Inactive Electronic Signature(s) Signed: 10/22/2016 4:46:17 PM By: Montey Hora Entered By: Montey Hora on 10/22/2016 09:47:02 Popescu, [name] ([id number]) -------------------------------------------------------------------------------- Pain Assessment Details Patient Name: [name]. Date of Service: 10/22/2016 9:15 AM Medical Record Number: [id number] Patient Account Number: [id number] Date of Birth/Sex: 1940/11/28 (76 y.o. Male) Treating RN: Montey Hora Primary Care Laverda Stribling: Tedra Senegal Other  Clinician: Referring Brecklynn Jian: Tedra Senegal Treating Kamaree Berkel/Extender: Frann Rider in Treatment: 2 Active Problems Location of Pain Severity and Description of Pain Patient Has Paino No Site Locations Pain Management and Medication Current Pain Management: Notes Topical or injectable lidocaine is offered to patient for acute pain when surgical debridement is performed. If needed, Patient is instructed to use over the counter pain medication for the following 24-48 hours after debridement. Wound care MDs do not prescribed pain medications. Patient has chronic pain or uncontrolled pain. Patient has been instructed to make an appointment with their Primary Care Physician for pain management. Electronic Signature(s) Signed: 10/22/2016 4:46:17 PM By: Montey Hora Entered By: Montey Hora on 10/22/2016 09:20:46 Aerts, [name] ([id number]) -------------------------------------------------------------------------------- Patient/Caregiver Education Details Patient Name: [name]. Date of Service: 10/22/2016 9:15 AM Medical Record Number: [id number] Patient Account Number: [id number] Date of Birth/Gender: 10-29-1940 (76 y.o. Male) Treating RN: Montey Hora Primary Care Physician: Tedra Senegal Other Clinician: Referring Physician: Tedra Senegal Treating Physician/Extender: Frann Rider in Treatment: 2 Education Assessment Education Provided To: Patient Education Topics Provided Basic Hygiene: Handouts: Other: skin care of newly healed ulcer site Methods: Explain/Verbal Responses: State content correctly Electronic Signature(s) Signed: 10/22/2016 4:46:17 PM By: Montey Hora Entered By: Montey Hora on 10/22/2016 09:51:43 Byard, [name] ([id number]) -------------------------------------------------------------------------------- Wound Assessment Details Patient Name: [name]. Date of Service: 10/22/2016 9:15 AM Medical Record Number:  [id number] Patient Account Number: [id number] Date of Birth/Sex: March 13, 1941 (76 y.o. Male) Treating RN: Montey Hora Primary Care Ivelise Castillo: Tedra Senegal Other Clinician: Referring Shandon Burlingame: Tedra Senegal Treating Laurelin Elson/Extender: Frann Rider in Treatment: 2 Wound Status Wound Number: 1 Primary Pressure Ulcer Etiology: Wound Location: Abdomen - midline Wound Healed - Epithelialized Wounding Event: Gradually Appeared Status: Date Acquired: 09/10/2016 Comorbid Anemia, Hypertension, Peripheral Weeks Of Treatment: 2 History: Venous Disease, Type II Diabetes, Clustered Wound: No Gout, Neuropathy Photos Photo Uploaded By: Montey Hora on 10/22/2016 09:46:36 Wound Measurements Length: (cm) 0 % Reduction in Width: (cm) 0 % Reduction in Depth: (cm) 0 Epithelializati Area: (cm) 0 Tunneling: Volume: (cm) 0 Undermining: Area: 100% Volume: 100% on: Large (67-100%) No No Wound Description Classification: Category/Stage II Wound Margin: Flat and Intact Exudate Amount: None Present Foul Odor After Cleansing: No Slough/Fibrino No Wound Bed Granulation Amount: None Present (0%) Exposed Structure Necrotic Amount: None Present (0%) Fascia Exposed: No Fat Layer (Subcutaneous Tissue) Exposed:  No Tendon Exposed: No Muscle Exposed: No Joint Exposed: No Korte, [name] ([id number]) Bone Exposed: No Limited to Skin Breakdown Periwound Skin Texture Texture Color No Abnormalities Noted: No No Abnormalities Noted: No Callus: No Atrophie Blanche: No Crepitus: No Cyanosis: No Excoriation: No Ecchymosis: No Induration: No Erythema: No Rash: No Hemosiderin Staining: No Scarring: No Mottled: No Pallor: No Moisture Rubor: No No Abnormalities Noted: No Dry / Scaly: No Temperature / Pain Maceration: No Temperature: No Abnormality Wound Preparation Ulcer Cleansing: Rinsed/Irrigated with Saline Topical Anesthetic Applied: None Electronic Signature(s) Signed:  10/22/2016 4:46:17 PM By: Montey Hora Entered By: Montey Hora on 10/22/2016 09:31:08 Sicard, [name] ([id number]) -------------------------------------------------------------------------------- [place] Details Patient Name: [name]. Date of Service: 10/22/2016 9:15 AM Medical Record Number: [id number] Patient Account Number: [id number] Date of Birth/Sex: 1940/10/16 (76 y.o. Male) Treating RN: Montey Hora Primary Care Maddalena Linarez: Tedra Senegal Other Clinician: Referring Adonay Scheier: Tedra Senegal Treating Indiana Gamero/Extender: Frann Rider in Treatment: 2 Vital Signs Time Taken: 09:21 Temperature (F): 98.2 Height (in): 71 Pulse (bpm): 57 Weight (lbs): 203 Respiratory Rate (breaths/min): 16 Body Mass Index (BMI): 28.3 Blood Pressure (mmHg): 151/43 Reference Range: 80 - 120 mg / dl Electronic Signature(s) Signed: 10/22/2016 4:46:17 PM By: Montey Hora Entered By: Montey Hora on 10/22/2016 03:21:22

## 2016-10-24 DIAGNOSIS — E1122 Type 2 diabetes mellitus with diabetic chronic kidney disease: Secondary | ICD-10-CM | POA: Diagnosis not present

## 2016-10-24 DIAGNOSIS — E1129 Type 2 diabetes mellitus with other diabetic kidney complication: Secondary | ICD-10-CM | POA: Diagnosis not present

## 2016-10-24 DIAGNOSIS — N186 End stage renal disease: Secondary | ICD-10-CM | POA: Diagnosis not present

## 2016-10-24 DIAGNOSIS — D631 Anemia in chronic kidney disease: Secondary | ICD-10-CM | POA: Diagnosis not present

## 2016-10-24 DIAGNOSIS — D509 Iron deficiency anemia, unspecified: Secondary | ICD-10-CM | POA: Diagnosis not present

## 2016-10-24 DIAGNOSIS — Z992 Dependence on renal dialysis: Secondary | ICD-10-CM | POA: Diagnosis not present

## 2016-10-24 DIAGNOSIS — Z23 Encounter for immunization: Secondary | ICD-10-CM | POA: Diagnosis not present

## 2016-10-24 DIAGNOSIS — N2581 Secondary hyperparathyroidism of renal origin: Secondary | ICD-10-CM | POA: Diagnosis not present

## 2016-10-26 DIAGNOSIS — E1129 Type 2 diabetes mellitus with other diabetic kidney complication: Secondary | ICD-10-CM | POA: Diagnosis not present

## 2016-10-26 DIAGNOSIS — N2581 Secondary hyperparathyroidism of renal origin: Secondary | ICD-10-CM | POA: Diagnosis not present

## 2016-10-26 DIAGNOSIS — N186 End stage renal disease: Secondary | ICD-10-CM | POA: Diagnosis not present

## 2016-10-26 DIAGNOSIS — D509 Iron deficiency anemia, unspecified: Secondary | ICD-10-CM | POA: Diagnosis not present

## 2016-10-29 DIAGNOSIS — N2581 Secondary hyperparathyroidism of renal origin: Secondary | ICD-10-CM | POA: Diagnosis not present

## 2016-10-29 DIAGNOSIS — E1129 Type 2 diabetes mellitus with other diabetic kidney complication: Secondary | ICD-10-CM | POA: Diagnosis not present

## 2016-10-29 DIAGNOSIS — N186 End stage renal disease: Secondary | ICD-10-CM | POA: Diagnosis not present

## 2016-10-29 DIAGNOSIS — D509 Iron deficiency anemia, unspecified: Secondary | ICD-10-CM | POA: Diagnosis not present

## 2016-10-31 DIAGNOSIS — D509 Iron deficiency anemia, unspecified: Secondary | ICD-10-CM | POA: Diagnosis not present

## 2016-10-31 DIAGNOSIS — N186 End stage renal disease: Secondary | ICD-10-CM | POA: Diagnosis not present

## 2016-10-31 DIAGNOSIS — N2581 Secondary hyperparathyroidism of renal origin: Secondary | ICD-10-CM | POA: Diagnosis not present

## 2016-10-31 DIAGNOSIS — E1129 Type 2 diabetes mellitus with other diabetic kidney complication: Secondary | ICD-10-CM | POA: Diagnosis not present

## 2016-11-02 DIAGNOSIS — D509 Iron deficiency anemia, unspecified: Secondary | ICD-10-CM | POA: Diagnosis not present

## 2016-11-02 DIAGNOSIS — N2581 Secondary hyperparathyroidism of renal origin: Secondary | ICD-10-CM | POA: Diagnosis not present

## 2016-11-02 DIAGNOSIS — N186 End stage renal disease: Secondary | ICD-10-CM | POA: Diagnosis not present

## 2016-11-02 DIAGNOSIS — E1129 Type 2 diabetes mellitus with other diabetic kidney complication: Secondary | ICD-10-CM | POA: Diagnosis not present

## 2016-11-05 DIAGNOSIS — E1129 Type 2 diabetes mellitus with other diabetic kidney complication: Secondary | ICD-10-CM | POA: Diagnosis not present

## 2016-11-05 DIAGNOSIS — N186 End stage renal disease: Secondary | ICD-10-CM | POA: Diagnosis not present

## 2016-11-05 DIAGNOSIS — D509 Iron deficiency anemia, unspecified: Secondary | ICD-10-CM | POA: Diagnosis not present

## 2016-11-05 DIAGNOSIS — N2581 Secondary hyperparathyroidism of renal origin: Secondary | ICD-10-CM | POA: Diagnosis not present

## 2016-11-06 ENCOUNTER — Other Ambulatory Visit: Payer: Self-pay | Admitting: Endocrinology

## 2016-11-07 DIAGNOSIS — N2581 Secondary hyperparathyroidism of renal origin: Secondary | ICD-10-CM | POA: Diagnosis not present

## 2016-11-07 DIAGNOSIS — E1129 Type 2 diabetes mellitus with other diabetic kidney complication: Secondary | ICD-10-CM | POA: Diagnosis not present

## 2016-11-07 DIAGNOSIS — D509 Iron deficiency anemia, unspecified: Secondary | ICD-10-CM | POA: Diagnosis not present

## 2016-11-07 DIAGNOSIS — N186 End stage renal disease: Secondary | ICD-10-CM | POA: Diagnosis not present

## 2016-11-09 DIAGNOSIS — E1129 Type 2 diabetes mellitus with other diabetic kidney complication: Secondary | ICD-10-CM | POA: Diagnosis not present

## 2016-11-09 DIAGNOSIS — D509 Iron deficiency anemia, unspecified: Secondary | ICD-10-CM | POA: Diagnosis not present

## 2016-11-09 DIAGNOSIS — N186 End stage renal disease: Secondary | ICD-10-CM | POA: Diagnosis not present

## 2016-11-09 DIAGNOSIS — N2581 Secondary hyperparathyroidism of renal origin: Secondary | ICD-10-CM | POA: Diagnosis not present

## 2016-11-12 DIAGNOSIS — D509 Iron deficiency anemia, unspecified: Secondary | ICD-10-CM | POA: Diagnosis not present

## 2016-11-12 DIAGNOSIS — N186 End stage renal disease: Secondary | ICD-10-CM | POA: Diagnosis not present

## 2016-11-12 DIAGNOSIS — N2581 Secondary hyperparathyroidism of renal origin: Secondary | ICD-10-CM | POA: Diagnosis not present

## 2016-11-12 DIAGNOSIS — E1129 Type 2 diabetes mellitus with other diabetic kidney complication: Secondary | ICD-10-CM | POA: Diagnosis not present

## 2016-11-14 DIAGNOSIS — E1129 Type 2 diabetes mellitus with other diabetic kidney complication: Secondary | ICD-10-CM | POA: Diagnosis not present

## 2016-11-14 DIAGNOSIS — N2581 Secondary hyperparathyroidism of renal origin: Secondary | ICD-10-CM | POA: Diagnosis not present

## 2016-11-14 DIAGNOSIS — N186 End stage renal disease: Secondary | ICD-10-CM | POA: Diagnosis not present

## 2016-11-14 DIAGNOSIS — D509 Iron deficiency anemia, unspecified: Secondary | ICD-10-CM | POA: Diagnosis not present

## 2016-11-15 DIAGNOSIS — L218 Other seborrheic dermatitis: Secondary | ICD-10-CM | POA: Diagnosis not present

## 2016-11-16 DIAGNOSIS — D509 Iron deficiency anemia, unspecified: Secondary | ICD-10-CM | POA: Diagnosis not present

## 2016-11-16 DIAGNOSIS — N2581 Secondary hyperparathyroidism of renal origin: Secondary | ICD-10-CM | POA: Diagnosis not present

## 2016-11-16 DIAGNOSIS — N186 End stage renal disease: Secondary | ICD-10-CM | POA: Diagnosis not present

## 2016-11-16 DIAGNOSIS — E1129 Type 2 diabetes mellitus with other diabetic kidney complication: Secondary | ICD-10-CM | POA: Diagnosis not present

## 2016-11-19 DIAGNOSIS — D509 Iron deficiency anemia, unspecified: Secondary | ICD-10-CM | POA: Diagnosis not present

## 2016-11-19 DIAGNOSIS — E1129 Type 2 diabetes mellitus with other diabetic kidney complication: Secondary | ICD-10-CM | POA: Diagnosis not present

## 2016-11-19 DIAGNOSIS — N2581 Secondary hyperparathyroidism of renal origin: Secondary | ICD-10-CM | POA: Diagnosis not present

## 2016-11-19 DIAGNOSIS — N186 End stage renal disease: Secondary | ICD-10-CM | POA: Diagnosis not present

## 2016-11-21 DIAGNOSIS — E1129 Type 2 diabetes mellitus with other diabetic kidney complication: Secondary | ICD-10-CM | POA: Diagnosis not present

## 2016-11-21 DIAGNOSIS — D509 Iron deficiency anemia, unspecified: Secondary | ICD-10-CM | POA: Diagnosis not present

## 2016-11-21 DIAGNOSIS — N2581 Secondary hyperparathyroidism of renal origin: Secondary | ICD-10-CM | POA: Diagnosis not present

## 2016-11-21 DIAGNOSIS — Z992 Dependence on renal dialysis: Secondary | ICD-10-CM | POA: Diagnosis not present

## 2016-11-21 DIAGNOSIS — E1122 Type 2 diabetes mellitus with diabetic chronic kidney disease: Secondary | ICD-10-CM | POA: Diagnosis not present

## 2016-11-21 DIAGNOSIS — N186 End stage renal disease: Secondary | ICD-10-CM | POA: Diagnosis not present

## 2016-11-23 DIAGNOSIS — N2581 Secondary hyperparathyroidism of renal origin: Secondary | ICD-10-CM | POA: Diagnosis not present

## 2016-11-23 DIAGNOSIS — D631 Anemia in chronic kidney disease: Secondary | ICD-10-CM | POA: Diagnosis not present

## 2016-11-23 DIAGNOSIS — N186 End stage renal disease: Secondary | ICD-10-CM | POA: Diagnosis not present

## 2016-11-23 DIAGNOSIS — E1129 Type 2 diabetes mellitus with other diabetic kidney complication: Secondary | ICD-10-CM | POA: Diagnosis not present

## 2016-11-23 DIAGNOSIS — D509 Iron deficiency anemia, unspecified: Secondary | ICD-10-CM | POA: Diagnosis not present

## 2016-11-26 DIAGNOSIS — D509 Iron deficiency anemia, unspecified: Secondary | ICD-10-CM | POA: Diagnosis not present

## 2016-11-26 DIAGNOSIS — D631 Anemia in chronic kidney disease: Secondary | ICD-10-CM | POA: Diagnosis not present

## 2016-11-26 DIAGNOSIS — E1129 Type 2 diabetes mellitus with other diabetic kidney complication: Secondary | ICD-10-CM | POA: Diagnosis not present

## 2016-11-26 DIAGNOSIS — N186 End stage renal disease: Secondary | ICD-10-CM | POA: Diagnosis not present

## 2016-11-26 DIAGNOSIS — N2581 Secondary hyperparathyroidism of renal origin: Secondary | ICD-10-CM | POA: Diagnosis not present

## 2016-11-28 DIAGNOSIS — D631 Anemia in chronic kidney disease: Secondary | ICD-10-CM | POA: Diagnosis not present

## 2016-11-28 DIAGNOSIS — E1129 Type 2 diabetes mellitus with other diabetic kidney complication: Secondary | ICD-10-CM | POA: Diagnosis not present

## 2016-11-28 DIAGNOSIS — D509 Iron deficiency anemia, unspecified: Secondary | ICD-10-CM | POA: Diagnosis not present

## 2016-11-28 DIAGNOSIS — N186 End stage renal disease: Secondary | ICD-10-CM | POA: Diagnosis not present

## 2016-11-28 DIAGNOSIS — N2581 Secondary hyperparathyroidism of renal origin: Secondary | ICD-10-CM | POA: Diagnosis not present

## 2016-11-30 DIAGNOSIS — D631 Anemia in chronic kidney disease: Secondary | ICD-10-CM | POA: Diagnosis not present

## 2016-11-30 DIAGNOSIS — N2581 Secondary hyperparathyroidism of renal origin: Secondary | ICD-10-CM | POA: Diagnosis not present

## 2016-11-30 DIAGNOSIS — D509 Iron deficiency anemia, unspecified: Secondary | ICD-10-CM | POA: Diagnosis not present

## 2016-11-30 DIAGNOSIS — N186 End stage renal disease: Secondary | ICD-10-CM | POA: Diagnosis not present

## 2016-11-30 DIAGNOSIS — E1129 Type 2 diabetes mellitus with other diabetic kidney complication: Secondary | ICD-10-CM | POA: Diagnosis not present

## 2016-12-03 DIAGNOSIS — E1129 Type 2 diabetes mellitus with other diabetic kidney complication: Secondary | ICD-10-CM | POA: Diagnosis not present

## 2016-12-03 DIAGNOSIS — D509 Iron deficiency anemia, unspecified: Secondary | ICD-10-CM | POA: Diagnosis not present

## 2016-12-03 DIAGNOSIS — N186 End stage renal disease: Secondary | ICD-10-CM | POA: Diagnosis not present

## 2016-12-03 DIAGNOSIS — D631 Anemia in chronic kidney disease: Secondary | ICD-10-CM | POA: Diagnosis not present

## 2016-12-03 DIAGNOSIS — N2581 Secondary hyperparathyroidism of renal origin: Secondary | ICD-10-CM | POA: Diagnosis not present

## 2016-12-05 DIAGNOSIS — D631 Anemia in chronic kidney disease: Secondary | ICD-10-CM | POA: Diagnosis not present

## 2016-12-05 DIAGNOSIS — E1129 Type 2 diabetes mellitus with other diabetic kidney complication: Secondary | ICD-10-CM | POA: Diagnosis not present

## 2016-12-05 DIAGNOSIS — D509 Iron deficiency anemia, unspecified: Secondary | ICD-10-CM | POA: Diagnosis not present

## 2016-12-05 DIAGNOSIS — N2581 Secondary hyperparathyroidism of renal origin: Secondary | ICD-10-CM | POA: Diagnosis not present

## 2016-12-05 DIAGNOSIS — N186 End stage renal disease: Secondary | ICD-10-CM | POA: Diagnosis not present

## 2016-12-06 ENCOUNTER — Other Ambulatory Visit (INDEPENDENT_AMBULATORY_CARE_PROVIDER_SITE_OTHER): Payer: Medicare Other

## 2016-12-06 DIAGNOSIS — E1165 Type 2 diabetes mellitus with hyperglycemia: Secondary | ICD-10-CM

## 2016-12-06 DIAGNOSIS — Z794 Long term (current) use of insulin: Secondary | ICD-10-CM

## 2016-12-06 LAB — HEMOGLOBIN A1C: Hgb A1c MFr Bld: 7.8 % — ABNORMAL HIGH (ref 4.6–6.5)

## 2016-12-06 LAB — GLUCOSE, RANDOM: Glucose, Bld: 56 mg/dL — ABNORMAL LOW (ref 70–99)

## 2016-12-07 DIAGNOSIS — E1129 Type 2 diabetes mellitus with other diabetic kidney complication: Secondary | ICD-10-CM | POA: Diagnosis not present

## 2016-12-07 DIAGNOSIS — N2581 Secondary hyperparathyroidism of renal origin: Secondary | ICD-10-CM | POA: Diagnosis not present

## 2016-12-07 DIAGNOSIS — D509 Iron deficiency anemia, unspecified: Secondary | ICD-10-CM | POA: Diagnosis not present

## 2016-12-07 DIAGNOSIS — N186 End stage renal disease: Secondary | ICD-10-CM | POA: Diagnosis not present

## 2016-12-07 DIAGNOSIS — D631 Anemia in chronic kidney disease: Secondary | ICD-10-CM | POA: Diagnosis not present

## 2016-12-10 ENCOUNTER — Ambulatory Visit (INDEPENDENT_AMBULATORY_CARE_PROVIDER_SITE_OTHER): Payer: Medicare Other | Admitting: Endocrinology

## 2016-12-10 ENCOUNTER — Encounter: Payer: Self-pay | Admitting: Endocrinology

## 2016-12-10 VITALS — BP 116/76 | HR 72 | Ht 71.0 in | Wt 202.0 lb

## 2016-12-10 DIAGNOSIS — N2581 Secondary hyperparathyroidism of renal origin: Secondary | ICD-10-CM | POA: Diagnosis not present

## 2016-12-10 DIAGNOSIS — N186 End stage renal disease: Secondary | ICD-10-CM | POA: Diagnosis not present

## 2016-12-10 DIAGNOSIS — Z794 Long term (current) use of insulin: Secondary | ICD-10-CM | POA: Diagnosis not present

## 2016-12-10 DIAGNOSIS — D631 Anemia in chronic kidney disease: Secondary | ICD-10-CM | POA: Diagnosis not present

## 2016-12-10 DIAGNOSIS — E1165 Type 2 diabetes mellitus with hyperglycemia: Secondary | ICD-10-CM | POA: Diagnosis not present

## 2016-12-10 DIAGNOSIS — E1129 Type 2 diabetes mellitus with other diabetic kidney complication: Secondary | ICD-10-CM | POA: Diagnosis not present

## 2016-12-10 DIAGNOSIS — D509 Iron deficiency anemia, unspecified: Secondary | ICD-10-CM | POA: Diagnosis not present

## 2016-12-10 NOTE — Patient Instructions (Addendum)
Take Novolog with you when going for dialysis  Check blood sugars on waking up 3x per week   Also check blood sugars about 2 hours after a meal and do this after different meals by rotation  Recommended blood sugar levels on waking up is 90-130 and about 2 hours after meal is 130-160  Please bring your blood sugar monitor to each visit, thank you  May take 12 Novolog if eating dessert

## 2016-12-10 NOTE — Progress Notes (Signed)
Patient ID: [name], male   DOB: 12/11/1940, 76 y.o.   MRN: [id number]           Reason for Appointment:  Follow-up for Type 2 Diabetes  Referring physician: [place]  History of Present Illness:          Date of diagnosis of type 2 diabetes mellitus :        Background history:  He has had long-standing diabetes probably treated with metformin initially and subsequently with sulfonylurea drugs At some point he was also given Actos in addition to his glipizide which he is still taking Records of his control are only available for the last 4 years Over the last few years his A1c has been generally in the 6-7% range His A1c was 6.2 in May 2016 and apparently was starting to get low normal blood sugars at times He was admitted to the hospital for fluid overload and renal failure and at that time his Actos was stopped Subsequently his blood sugars have been markedly increased He was switched from regular insulin to NovoLog on his initial consultation  Recent history:    His A1c is still relatively high at 7.8, previously 7.9  INSULIN regimen is described as: Lantus 10 units in am; 10 units Novolog with meals with syringe  Dialysis is done at 12 noon, doing it on Monday/Wednesday/Fridays     Current blood sugar patterns and problems identified:  He did not bring his monitor for download  FASTING blood sugars are reportedly fairly good and consistent without hypoglycemia  He had a glucose of 56 in the lab with taking Lantus that morning and no NovoLog or food intake.  However did not feel symptomatic  Although he was told to take some insulin when eating at dialysis for his snacks he does not do so and may take 5 units this before leaving home.  However he is snacking throughout the time he is at dialysis with crackers, chips and no proper meal  He says his blood sugars are usually higher on 190 when he comes back from dialysis in the afternoon  Although previously had some  high readings after supper he thinks readings are not going up at night.  Also not clear what his blood sugars are after breakfast or before lunch  He may sometimes have just a bagel at breakfast and does not always have  protein in the morning  He has tried to do a little walking but his weight is about the same  Oral hypoglycemic drugs the patient is taking are:  on Actos 30 mg daily in a.m.     Side effects from medications have been: None  Compliance with the medical regimen: Good   Glucose monitoring:  done 0-2  times a day         Glucometer:  Freestyle  Blood Glucose readings by recall  Mean values apply above for all meters except median for One Touch  PRE-MEAL Fasting Lunch Dinner Bedtime Overall  Glucose range: 106-130 ?    140?     Mean/median:        POST-MEAL PC Breakfast PC Lunch PC Dinner  Glucose range:  130-190    Mean/median:       Self-care: The diet that the patient has been following is: tries to limit sweets, portions. eating out only about once a week at a steak house        Typical meal intake: Breakfast is  Protein shake or  bagel; supper 8 pm   Lunch is irregular on nondialysis days but has a Larger lunch on Sundays             Dietician visit, most recent: ?               Exercise: some walking  Weight history:  Wt Readings from Last 3 Encounters:  12/10/16 202 lb (91.6 kg)  10/12/16 202 lb (91.6 kg)  10/04/16 198 lb (89.8 kg)    Glycemic control:    Lab Results  Component Value Date   HGBA1C 7.8 (H) 12/06/2016   HGBA1C 7.9 (H) 09/03/2016   HGBA1C 7.5 (H) 07/10/2016   Lab Results  Component Value Date   MICROALBUR 37.2 09/03/2016   LDLCALC 31 09/03/2016   CREATININE 9.18 (H) 09/03/2016    Lab Results  Component Value Date   FRUCTOSAMINE 355 (H) 10/08/2016   FRUCTOSAMINE 407 (H) 08/29/2016   FRUCTOSAMINE 357 (H) 03/01/2016      Allergies as of 12/10/2016      Reactions   Penicillins Rash   Has patient had a PCN reaction  causing immediate rash, facial/tongue/throat swelling, SOB or lightheadedness with hypotension: Yes Has patient had a PCN reaction causing severe rash involving mucus membranes or skin necrosis: Yes Has patient had a PCN reaction that required hospitalization: No Has patient had a PCN reaction occurring within the last 10 years: No If all of the above answers are "NO", then may proceed with Cephalosporin use.      Medication List       Accurate as of 12/10/16  3:18 PM. Always use your most recent med list.          acetaminophen 500 MG tablet Commonly known as:  TYLENOL Take 500 mg by mouth every 6 (six) hours as needed (for pain).   acetaminophen-codeine 300-30 MG tablet Commonly known as:  TYLENOL #3 take 1 tablet by mouth every 4 hours if needed for pain   allopurinol 100 MG tablet Commonly known as:  ZYLOPRIM Take 100 mg by mouth daily.   aspirin 325 MG tablet Take 650 mg by mouth daily.   clobetasol 0.05 % external solution Commonly known as:  TEMOVATE apply to affected area ON THE SCALP UP TO 2 TIMES A DAY AS NEEDED...  (REFER TO PRESCRIPTION NOTES).   donepezil 10 MG tablet Commonly known as:  ARICEPT Take 1 tablet (10 mg total) by mouth at bedtime.   Fluocinolone Acetonide Scalp 0.01 % Oil apply to affected area ON SCALP EVERY NIGHT AT BEDTIME, WASH UPON AWAKENING   fluticasone 0.05 % cream Commonly known as:  CUTIVATE apply to affected area ON FACE UP TO 2 TIMES A DAY AS NEEDED   fluticasone 50 MCG/ACT nasal spray Commonly known as:  FLONASE Place 1 spray into both nostrils daily.   FREESTYLE FREEDOM LITE w/Device Kit Use to check blood sugar 2 times per day dx code E11.65   freestyle lancets Use as instructed to check blood sugar 2 times per day dx code E11.65   glucose blood test strip Commonly known as:  ONETOUCH VERIO Use to check blood sugar 2 times per day. Dx code E11.65   insulin aspart 100 UNIT/ML injection Commonly known as:   novoLOG Inject 10 Units into the skin 3 (three) times daily before meals.   insulin glargine 100 UNIT/ML injection Commonly known as:  LANTUS Inject 0.08 mLs (8 Units total) into the skin every morning.   ketoconazole 2 % shampoo  Commonly known as:  NIZORAL Apply 1 application topically 2 (two) times a week.   levothyroxine 50 MCG tablet Commonly known as:  SYNTHROID, LEVOTHROID Take 1 tablet (50 mcg total) by mouth daily.   lidocaine-prilocaine cream Commonly known as:  EMLA Apply 1 application topically once. AS DIRECTED   loratadine 10 MG tablet Commonly known as:  CLARITIN Take 10 mg by mouth daily as needed for allergies.   metroNIDAZOLE 0.75 % gel Commonly known as:  METROGEL APPLY TO AFFECTED AREA ON SKIN  ON FACE TWICE DAILY   mupirocin ointment 2 % Commonly known as:  BACTROBAN Use as directed to affected areas   Naftifine HCl 2 % Crea Commonly known as:  NAFTIN Apply to feet daily for fungal infection   pioglitazone 30 MG tablet Commonly known as:  ACTOS take 1 tablet by mouth once daily   RENVELA 800 MG tablet Generic drug:  sevelamer carbonate Take 800-2,400 mg by mouth See admin instructions. 1,600-2,400 mg with breakfast then 1,600 mg with lunch then 2,400 mg with dinner (evening meal) and 800 mg with each snack   SENSIPAR 30 MG tablet Generic drug:  cinacalcet Take 30 mg by mouth daily.   simvastatin 20 MG tablet Commonly known as:  ZOCOR Take 20 mg by mouth at bedtime.   triamcinolone ointment 0.1 % Commonly known as:  KENALOG Apply 1 application topically 2 (two) times daily. Use between thighs       Allergies:  Allergies  Allergen Reactions  . Penicillins Rash    Has patient had a PCN reaction causing immediate rash, facial/tongue/throat swelling, SOB or lightheadedness with hypotension: Yes Has patient had a PCN reaction causing severe rash involving mucus membranes or skin necrosis: Yes Has patient had a PCN reaction that required  hospitalization: No Has patient had a PCN reaction occurring within the last 10 years: No If all of the above answers are "NO", then may proceed with Cephalosporin use.     Past Medical History:  Diagnosis Date  . Allergy   . Anemia   . Arthritis   . Coronary artery disease   . Diabetes mellitus    Type 2  . Diverticulitis   . ED (erectile dysfunction)   . 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 ESRD (end stage renal disease) on dialysis ([place]) 03/2015  . GERD (gastroesophageal reflux disease)    pepto   . Gout   . Hyperlipidemia   . Hypertension   . Hypothyroidism   . Pneumonia   . PVD (peripheral vascular disease) ([place])    has plastic aorta  . Renal insufficiency   . Seasonal allergies   . Shortness of breath dyspnea   . Sleep apnea   . Thyroid disease     Past Surgical History:  Procedure Laterality Date  . aortobifemoral bypass    . AV FISTULA PLACEMENT Left 12/01/2013   Procedure: ARTERIOVENOUS (AV) FISTULA CREATION- LEFT BRACHIOCEPHALIC;  Surgeon: Angelia Mould, MD;  Location: [facility];  Service: Vascular;  Laterality: Left;  . [place] TRANSPOSITION Right 07/27/2014   Procedure: BASCILIC VEIN TRANSPOSITION;  Surgeon: Angelia Mould, MD;  Location: [facility];  Service: Vascular;  Laterality: Right;  . BREAST SURGERY     left - granulomatous mastitis  . COLONOSCOPY    . 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 EYE SURGERY Bilateral    cataracts  . REVISON OF ARTERIOVENOUS FISTULA Left 02/09/2014   Procedure: REVISON OF LEFT  ARTERIOVENOUS FISTULA - RESECTION OF RENDUNDANT VEIN;  Surgeon: Angelia Mould, MD;  Location: [facility];  Service: Vascular;  Laterality: Left;  . SBO with lysis adhesions    . SHUNTOGRAM Left 04/19/2014   Procedure: FISTULOGRAM;  Surgeon: Angelia Mould, MD;  Location: [facility] CATH LAB;  Service: Cardiovascular;  Laterality: Left;  . UNILATERAL UPPER EXTREMEITY ANGIOGRAM N/A 07/12/2014   Procedure: UNILATERAL UPPER  Anselmo Rod;  Surgeon: Angelia Mould, MD;  Location: [facility] CATH LAB;  Service: Cardiovascular;  Laterality: N/A;    Family History  Problem Relation Age of Onset  . Aneurysm Mother   . Heart disease Father   . Stroke Father   . Hypertension Father   . Diabetes Father   . Dementia Neg Hx     Social History:  reports that he quit smoking about 22 years ago. He has never used smokeless tobacco. He reports that he does not drink alcohol or use drugs.    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 Glucose, Bld 12/06/2016 56* 70 - 99 mg/dL Final  . Hgb A1c MFr Bld 12/06/2016 7.8* 4.6 - 6.5 % Final    Physical Examination:  BP 116/76   Pulse 72   Ht 5' 11" (1.803 m)   Wt 202 lb (91.6 kg)   SpO2 99%   BMI 28.17 kg/m       ASSESSMENT:  Diabetes type 2, uncontrolled See history of present illness for detailed discussion of his current management, blood sugar patterns and problems identified  His A1c is still relatively high at 7.8 Most likely has postprandial hyperglycemia but unable to review his blood sugar patterns because of his not bring his monitor for download today. He does appear to have very erratic diet and does not have a meal plan which will affect his blood sugar patterns Also getting fairly regular hyperglycemia with eating a lot of snacks while he is at dialysis in the early afternoon without any insulin coverage  He may have some tendency to hypoglycemia unawareness as his lab  glucose was 56 without symptoms; also he is usually taking his NovoLog at home before he goes out to eat which could potentially cause hypoglycemia   PLAN:   He will Bring his monitor for download later this week and will review it  Discussed needing to check blood sugars after meals more often than in the morning since fasting readings are usually fairly good  Emphasized the need to take the NOVOLOG right before eating rather than several minutes for readings especially when going out to potentially avoid hypoglycemia  He will take his insulin refills in a syringe with him when going for dialysis and have a meal like a sandwich instead of multiple carbohydrate snacks  Consultation with dietitian to have a proper meal plan, he can do better with more balanced meals at breakfast also  Will review his blood sugars especially after supper on his next visit to make sure he is monitoring enough  Insulin will be adjusted as needed  Since he is getting his supplies of insulin from the [facility] may not be able to get him to [place] or even [place]  Encouraged him to be more active with walking    Patient Instructions  Take Novolog with you when going for dialysis  Check blood sugars on waking up 3x per week   Also check blood sugars about 2 hours after a meal and do this after different meals by rotation  Recommended blood sugar levels on waking up is 90-130 and about 2 hours after meal is 130-160  Please bring your blood sugar monitor to each visit, thank you  May take 12 Novolog if eating dessert     Counseling time on subjects discussed above is over 50% of today's 25 minute visit       , 12/10/2016, 3:18 PM   Note: This office note was prepared with Dragon voice recognition system technology. Any transcriptional errors that result from this process are unintentional.

## 2016-12-12 DIAGNOSIS — N2581 Secondary hyperparathyroidism of renal origin: Secondary | ICD-10-CM | POA: Diagnosis not present

## 2016-12-12 DIAGNOSIS — D631 Anemia in chronic kidney disease: Secondary | ICD-10-CM | POA: Diagnosis not present

## 2016-12-12 DIAGNOSIS — N186 End stage renal disease: Secondary | ICD-10-CM | POA: Diagnosis not present

## 2016-12-12 DIAGNOSIS — E1129 Type 2 diabetes mellitus with other diabetic kidney complication: Secondary | ICD-10-CM | POA: Diagnosis not present

## 2016-12-12 DIAGNOSIS — D509 Iron deficiency anemia, unspecified: Secondary | ICD-10-CM | POA: Diagnosis not present

## 2016-12-14 DIAGNOSIS — N186 End stage renal disease: Secondary | ICD-10-CM | POA: Diagnosis not present

## 2016-12-14 DIAGNOSIS — N2581 Secondary hyperparathyroidism of renal origin: Secondary | ICD-10-CM | POA: Diagnosis not present

## 2016-12-14 DIAGNOSIS — D631 Anemia in chronic kidney disease: Secondary | ICD-10-CM | POA: Diagnosis not present

## 2016-12-14 DIAGNOSIS — D509 Iron deficiency anemia, unspecified: Secondary | ICD-10-CM | POA: Diagnosis not present

## 2016-12-14 DIAGNOSIS — E1129 Type 2 diabetes mellitus with other diabetic kidney complication: Secondary | ICD-10-CM | POA: Diagnosis not present

## 2016-12-17 DIAGNOSIS — D631 Anemia in chronic kidney disease: Secondary | ICD-10-CM | POA: Diagnosis not present

## 2016-12-17 DIAGNOSIS — N186 End stage renal disease: Secondary | ICD-10-CM | POA: Diagnosis not present

## 2016-12-17 DIAGNOSIS — D509 Iron deficiency anemia, unspecified: Secondary | ICD-10-CM | POA: Diagnosis not present

## 2016-12-17 DIAGNOSIS — E1129 Type 2 diabetes mellitus with other diabetic kidney complication: Secondary | ICD-10-CM | POA: Diagnosis not present

## 2016-12-17 DIAGNOSIS — N2581 Secondary hyperparathyroidism of renal origin: Secondary | ICD-10-CM | POA: Diagnosis not present

## 2016-12-19 DIAGNOSIS — E1129 Type 2 diabetes mellitus with other diabetic kidney complication: Secondary | ICD-10-CM | POA: Diagnosis not present

## 2016-12-19 DIAGNOSIS — N186 End stage renal disease: Secondary | ICD-10-CM | POA: Diagnosis not present

## 2016-12-19 DIAGNOSIS — D509 Iron deficiency anemia, unspecified: Secondary | ICD-10-CM | POA: Diagnosis not present

## 2016-12-19 DIAGNOSIS — D631 Anemia in chronic kidney disease: Secondary | ICD-10-CM | POA: Diagnosis not present

## 2016-12-19 DIAGNOSIS — N2581 Secondary hyperparathyroidism of renal origin: Secondary | ICD-10-CM | POA: Diagnosis not present

## 2016-12-21 DIAGNOSIS — D509 Iron deficiency anemia, unspecified: Secondary | ICD-10-CM | POA: Diagnosis not present

## 2016-12-21 DIAGNOSIS — N2581 Secondary hyperparathyroidism of renal origin: Secondary | ICD-10-CM | POA: Diagnosis not present

## 2016-12-21 DIAGNOSIS — D631 Anemia in chronic kidney disease: Secondary | ICD-10-CM | POA: Diagnosis not present

## 2016-12-21 DIAGNOSIS — N186 End stage renal disease: Secondary | ICD-10-CM | POA: Diagnosis not present

## 2016-12-21 DIAGNOSIS — E1129 Type 2 diabetes mellitus with other diabetic kidney complication: Secondary | ICD-10-CM | POA: Diagnosis not present

## 2016-12-22 DIAGNOSIS — E1122 Type 2 diabetes mellitus with diabetic chronic kidney disease: Secondary | ICD-10-CM | POA: Diagnosis not present

## 2016-12-22 DIAGNOSIS — N186 End stage renal disease: Secondary | ICD-10-CM | POA: Diagnosis not present

## 2016-12-22 DIAGNOSIS — Z992 Dependence on renal dialysis: Secondary | ICD-10-CM | POA: Diagnosis not present

## 2016-12-24 DIAGNOSIS — D509 Iron deficiency anemia, unspecified: Secondary | ICD-10-CM | POA: Diagnosis not present

## 2016-12-24 DIAGNOSIS — D631 Anemia in chronic kidney disease: Secondary | ICD-10-CM | POA: Diagnosis not present

## 2016-12-24 DIAGNOSIS — N2581 Secondary hyperparathyroidism of renal origin: Secondary | ICD-10-CM | POA: Diagnosis not present

## 2016-12-24 DIAGNOSIS — E1129 Type 2 diabetes mellitus with other diabetic kidney complication: Secondary | ICD-10-CM | POA: Diagnosis not present

## 2016-12-24 DIAGNOSIS — N186 End stage renal disease: Secondary | ICD-10-CM | POA: Diagnosis not present

## 2016-12-26 DIAGNOSIS — D509 Iron deficiency anemia, unspecified: Secondary | ICD-10-CM | POA: Diagnosis not present

## 2016-12-26 DIAGNOSIS — D631 Anemia in chronic kidney disease: Secondary | ICD-10-CM | POA: Diagnosis not present

## 2016-12-26 DIAGNOSIS — N2581 Secondary hyperparathyroidism of renal origin: Secondary | ICD-10-CM | POA: Diagnosis not present

## 2016-12-26 DIAGNOSIS — E1129 Type 2 diabetes mellitus with other diabetic kidney complication: Secondary | ICD-10-CM | POA: Diagnosis not present

## 2016-12-26 DIAGNOSIS — N186 End stage renal disease: Secondary | ICD-10-CM | POA: Diagnosis not present

## 2016-12-27 ENCOUNTER — Encounter: Payer: Medicare Other | Admitting: Dietician

## 2016-12-28 DIAGNOSIS — N186 End stage renal disease: Secondary | ICD-10-CM | POA: Diagnosis not present

## 2016-12-28 DIAGNOSIS — N2581 Secondary hyperparathyroidism of renal origin: Secondary | ICD-10-CM | POA: Diagnosis not present

## 2016-12-28 DIAGNOSIS — D631 Anemia in chronic kidney disease: Secondary | ICD-10-CM | POA: Diagnosis not present

## 2016-12-28 DIAGNOSIS — E1129 Type 2 diabetes mellitus with other diabetic kidney complication: Secondary | ICD-10-CM | POA: Diagnosis not present

## 2016-12-28 DIAGNOSIS — D509 Iron deficiency anemia, unspecified: Secondary | ICD-10-CM | POA: Diagnosis not present

## 2016-12-31 DIAGNOSIS — N186 End stage renal disease: Secondary | ICD-10-CM | POA: Diagnosis not present

## 2016-12-31 DIAGNOSIS — N2581 Secondary hyperparathyroidism of renal origin: Secondary | ICD-10-CM | POA: Diagnosis not present

## 2016-12-31 DIAGNOSIS — D631 Anemia in chronic kidney disease: Secondary | ICD-10-CM | POA: Diagnosis not present

## 2016-12-31 DIAGNOSIS — E1129 Type 2 diabetes mellitus with other diabetic kidney complication: Secondary | ICD-10-CM | POA: Diagnosis not present

## 2016-12-31 DIAGNOSIS — D509 Iron deficiency anemia, unspecified: Secondary | ICD-10-CM | POA: Diagnosis not present

## 2017-01-02 DIAGNOSIS — E1129 Type 2 diabetes mellitus with other diabetic kidney complication: Secondary | ICD-10-CM | POA: Diagnosis not present

## 2017-01-02 DIAGNOSIS — D631 Anemia in chronic kidney disease: Secondary | ICD-10-CM | POA: Diagnosis not present

## 2017-01-02 DIAGNOSIS — D509 Iron deficiency anemia, unspecified: Secondary | ICD-10-CM | POA: Diagnosis not present

## 2017-01-02 DIAGNOSIS — N2581 Secondary hyperparathyroidism of renal origin: Secondary | ICD-10-CM | POA: Diagnosis not present

## 2017-01-02 DIAGNOSIS — N186 End stage renal disease: Secondary | ICD-10-CM | POA: Diagnosis not present

## 2017-01-04 DIAGNOSIS — D509 Iron deficiency anemia, unspecified: Secondary | ICD-10-CM | POA: Diagnosis not present

## 2017-01-04 DIAGNOSIS — N2581 Secondary hyperparathyroidism of renal origin: Secondary | ICD-10-CM | POA: Diagnosis not present

## 2017-01-04 DIAGNOSIS — D631 Anemia in chronic kidney disease: Secondary | ICD-10-CM | POA: Diagnosis not present

## 2017-01-04 DIAGNOSIS — N186 End stage renal disease: Secondary | ICD-10-CM | POA: Diagnosis not present

## 2017-01-04 DIAGNOSIS — E1129 Type 2 diabetes mellitus with other diabetic kidney complication: Secondary | ICD-10-CM | POA: Diagnosis not present

## 2017-01-07 DIAGNOSIS — D631 Anemia in chronic kidney disease: Secondary | ICD-10-CM | POA: Diagnosis not present

## 2017-01-07 DIAGNOSIS — D509 Iron deficiency anemia, unspecified: Secondary | ICD-10-CM | POA: Diagnosis not present

## 2017-01-07 DIAGNOSIS — N186 End stage renal disease: Secondary | ICD-10-CM | POA: Diagnosis not present

## 2017-01-07 DIAGNOSIS — N2581 Secondary hyperparathyroidism of renal origin: Secondary | ICD-10-CM | POA: Diagnosis not present

## 2017-01-07 DIAGNOSIS — E1129 Type 2 diabetes mellitus with other diabetic kidney complication: Secondary | ICD-10-CM | POA: Diagnosis not present

## 2017-01-09 ENCOUNTER — Ambulatory Visit: Payer: Medicare Other | Admitting: Nurse Practitioner

## 2017-01-09 DIAGNOSIS — N2581 Secondary hyperparathyroidism of renal origin: Secondary | ICD-10-CM | POA: Diagnosis not present

## 2017-01-09 DIAGNOSIS — N186 End stage renal disease: Secondary | ICD-10-CM | POA: Diagnosis not present

## 2017-01-09 DIAGNOSIS — E1129 Type 2 diabetes mellitus with other diabetic kidney complication: Secondary | ICD-10-CM | POA: Diagnosis not present

## 2017-01-09 DIAGNOSIS — D631 Anemia in chronic kidney disease: Secondary | ICD-10-CM | POA: Diagnosis not present

## 2017-01-09 DIAGNOSIS — D509 Iron deficiency anemia, unspecified: Secondary | ICD-10-CM | POA: Diagnosis not present

## 2017-01-11 DIAGNOSIS — N2581 Secondary hyperparathyroidism of renal origin: Secondary | ICD-10-CM | POA: Diagnosis not present

## 2017-01-11 DIAGNOSIS — N186 End stage renal disease: Secondary | ICD-10-CM | POA: Diagnosis not present

## 2017-01-11 DIAGNOSIS — L738 Other specified follicular disorders: Secondary | ICD-10-CM | POA: Diagnosis not present

## 2017-01-11 DIAGNOSIS — E1129 Type 2 diabetes mellitus with other diabetic kidney complication: Secondary | ICD-10-CM | POA: Diagnosis not present

## 2017-01-11 DIAGNOSIS — L218 Other seborrheic dermatitis: Secondary | ICD-10-CM | POA: Diagnosis not present

## 2017-01-11 DIAGNOSIS — D631 Anemia in chronic kidney disease: Secondary | ICD-10-CM | POA: Diagnosis not present

## 2017-01-11 DIAGNOSIS — D509 Iron deficiency anemia, unspecified: Secondary | ICD-10-CM | POA: Diagnosis not present

## 2017-01-14 DIAGNOSIS — N186 End stage renal disease: Secondary | ICD-10-CM | POA: Diagnosis not present

## 2017-01-14 DIAGNOSIS — E1129 Type 2 diabetes mellitus with other diabetic kidney complication: Secondary | ICD-10-CM | POA: Diagnosis not present

## 2017-01-14 DIAGNOSIS — D509 Iron deficiency anemia, unspecified: Secondary | ICD-10-CM | POA: Diagnosis not present

## 2017-01-14 DIAGNOSIS — D631 Anemia in chronic kidney disease: Secondary | ICD-10-CM | POA: Diagnosis not present

## 2017-01-14 DIAGNOSIS — N2581 Secondary hyperparathyroidism of renal origin: Secondary | ICD-10-CM | POA: Diagnosis not present

## 2017-01-16 DIAGNOSIS — N2581 Secondary hyperparathyroidism of renal origin: Secondary | ICD-10-CM | POA: Diagnosis not present

## 2017-01-16 DIAGNOSIS — D509 Iron deficiency anemia, unspecified: Secondary | ICD-10-CM | POA: Diagnosis not present

## 2017-01-16 DIAGNOSIS — E1129 Type 2 diabetes mellitus with other diabetic kidney complication: Secondary | ICD-10-CM | POA: Diagnosis not present

## 2017-01-16 DIAGNOSIS — N186 End stage renal disease: Secondary | ICD-10-CM | POA: Diagnosis not present

## 2017-01-16 DIAGNOSIS — D631 Anemia in chronic kidney disease: Secondary | ICD-10-CM | POA: Diagnosis not present

## 2017-01-18 DIAGNOSIS — D509 Iron deficiency anemia, unspecified: Secondary | ICD-10-CM | POA: Diagnosis not present

## 2017-01-18 DIAGNOSIS — N2581 Secondary hyperparathyroidism of renal origin: Secondary | ICD-10-CM | POA: Diagnosis not present

## 2017-01-18 DIAGNOSIS — N186 End stage renal disease: Secondary | ICD-10-CM | POA: Diagnosis not present

## 2017-01-18 DIAGNOSIS — D631 Anemia in chronic kidney disease: Secondary | ICD-10-CM | POA: Diagnosis not present

## 2017-01-18 DIAGNOSIS — E1129 Type 2 diabetes mellitus with other diabetic kidney complication: Secondary | ICD-10-CM | POA: Diagnosis not present

## 2017-01-21 DIAGNOSIS — Z992 Dependence on renal dialysis: Secondary | ICD-10-CM | POA: Diagnosis not present

## 2017-01-21 DIAGNOSIS — D631 Anemia in chronic kidney disease: Secondary | ICD-10-CM | POA: Diagnosis not present

## 2017-01-21 DIAGNOSIS — E1129 Type 2 diabetes mellitus with other diabetic kidney complication: Secondary | ICD-10-CM | POA: Diagnosis not present

## 2017-01-21 DIAGNOSIS — N186 End stage renal disease: Secondary | ICD-10-CM | POA: Diagnosis not present

## 2017-01-21 DIAGNOSIS — D509 Iron deficiency anemia, unspecified: Secondary | ICD-10-CM | POA: Diagnosis not present

## 2017-01-21 DIAGNOSIS — N2581 Secondary hyperparathyroidism of renal origin: Secondary | ICD-10-CM | POA: Diagnosis not present

## 2017-01-21 DIAGNOSIS — E1122 Type 2 diabetes mellitus with diabetic chronic kidney disease: Secondary | ICD-10-CM | POA: Diagnosis not present

## 2017-01-23 DIAGNOSIS — D509 Iron deficiency anemia, unspecified: Secondary | ICD-10-CM | POA: Diagnosis not present

## 2017-01-23 DIAGNOSIS — E1129 Type 2 diabetes mellitus with other diabetic kidney complication: Secondary | ICD-10-CM | POA: Diagnosis not present

## 2017-01-23 DIAGNOSIS — D631 Anemia in chronic kidney disease: Secondary | ICD-10-CM | POA: Diagnosis not present

## 2017-01-23 DIAGNOSIS — N186 End stage renal disease: Secondary | ICD-10-CM | POA: Diagnosis not present

## 2017-01-23 DIAGNOSIS — N2581 Secondary hyperparathyroidism of renal origin: Secondary | ICD-10-CM | POA: Diagnosis not present

## 2017-01-24 ENCOUNTER — Ambulatory Visit (INDEPENDENT_AMBULATORY_CARE_PROVIDER_SITE_OTHER): Payer: Medicare Other | Admitting: Nurse Practitioner

## 2017-01-24 ENCOUNTER — Encounter (INDEPENDENT_AMBULATORY_CARE_PROVIDER_SITE_OTHER): Payer: Self-pay

## 2017-01-24 ENCOUNTER — Encounter: Payer: Self-pay | Admitting: Nurse Practitioner

## 2017-01-24 VITALS — BP 125/60 | HR 70 | Ht 71.0 in | Wt 200.6 lb

## 2017-01-24 DIAGNOSIS — G3184 Mild cognitive impairment, so stated: Secondary | ICD-10-CM

## 2017-01-24 NOTE — Progress Notes (Signed)
[facility]  PATIENT: [name] DOB: [birth year]   REASON FOR VISIT: Follow-up for mild cognitive impairment HISTORY FROM: Patient    HISTORY OF PRESENT ILLNESS:UPDATE 5/ 3/ 2018CM [name], 76 year old male returns for follow-up with history of mild cognitive impairment. He is currently on Aricept 10 mg daily through the [place] without side effects. He continues to go to dialysis 3 times a week. He continues to be active. He is going to [place] 2 weeks to see his granddaughter get married. He has had no issues with driving. His  MOCA score is stable. He returns for reevaluation  UPDATE 10/18/2017CM [name], 76 year old male returns for follow-up. He has history of mild cognitive. MOCA is stable. He is currently on Aricept denies side effects to the medication. He gets his medications through the [place]. He goes to dialysis 3 times a week. He continues to be fairly active. He is planning a vacation to [place] next month. He feels his memory is stable He continues to drive without difficulty. He says he has not gotten lost. He returns for reevaluation   Interval history: 01/12/16 AAPatient no-showed for multiple (3) appointments and was discharged from our practice. I spoke to him and agreed to see him again wit the understanding that if he misses another appointment he will need to find another neurology group. He has been going through a lot recently, trying to get disability and his health has declined. He says he has too many appointments. He goes to Dr. [name] and also goes to the [place]. He can follow with Dr. [name], doesn't need to follow with [place] unless he needs [place] if he feels he has too many doctors. He takes 652m of asa a day, not sure why, advised him this is not indicated for stroke prevention. 825mdaily. 65065maily may increase his risk of bleeding. He is on Aricept 81m59mily and we will refill this today. Last hgba1c 7.2. LDL 33. TSH wnl. B12 in July 2016 was  1068 (Hx of b12 deficiency). Memory is stable, in fact he is feeling a little better.   HPI: [name] 73 y70. male here as a referral from Dr. BaxlRenold [name] memory problems. PMHx peripheral vascular disease, hypertension, hypothyroidism, hyperparathyroidism, diabetes(uncontrolled since starting dialysis), end-stage renal disease on dialysis, hyperlipidemia, b-12 deficiency on injections (B12 237 at that time). Recently he has noticed he can't catch onto things as quickly as he used to. The latter part of may her noticed it, when he was having more medical issues and he was gaining fluid and had a bout of pneumonia. His memory got really bad before he went into the hospital and was started on dialysis. Things are better since then. He is more alert and catching things better. Wife is here and provides much information, she says he can't keep up with his cell phone and keys and he loses them. He goes into the kitchen to make a snack and he forgets to close the cabinets. He is forgetting people's names, appointments. He used to always be able to monitor medications and he doesn't remember what he is taking his meds for now. He used to be very sharp with this meds but there is a big change. He misplaces checks. He does pay the bills. No accidents with the car or at home. No delusions or hallucinations. No changes in personality. He is more anxious because of the medical problems. Wife endorses depression. He still gets  out and likes to do things, but the side effects of his medical conditions are hard for him to handle. He is frustrated. No loss of consciousness, no episodes of confusion, no staring spells. No other focal neurologic deficits. He worries a lot. Endorses snoring, excessive daytime fatigue and witnessed apneic events.     REVIEW OF SYSTEMS: Full 14 system review of systems performed and notable only for those listed, all others are neg:  Constitutional: Fatigue Cardiovascular:  neg Ear/Nose/Throat: neg  Skin: neg Eyes: neg Respiratory: neg Gastroitestinal: neg  Hematology/Lymphatic: Easy bruising  Endocrine: neg Musculoskeletal:neg Allergy/Immunology: neg Neurological: Mild cognitive impairment Psychiatric: neg Sleep : neg   ALLERGIES:PCN   HOME MEDICATIONS: Outpatient Medications Prior to Visit  Medication Sig Dispense Refill  . acetaminophen (TYLENOL) 500 MG tablet Take 500 mg by mouth every 6 (six) hours as needed (for pain).     Marland Kitchen acetaminophen-codeine (TYLENOL #3) 300-30 MG tablet take 1 tablet by mouth every 4 hours if needed for pain  0  . allopurinol (ZYLOPRIM) 100 MG tablet Take 100 mg by mouth daily.      Marland Kitchen aspirin 325 MG tablet Take 650 mg by mouth daily.     . Blood Glucose Monitoring Suppl (FREESTYLE FREEDOM LITE) w/Device KIT Use to check blood sugar 2 times per day dx code E11.65 1 each 0  . clobetasol (TEMOVATE) 0.05 % external solution apply to affected area ON THE SCALP UP TO 2 TIMES A DAY AS NEEDED...  (REFER TO PRESCRIPTION NOTES).  0  . donepezil (ARICEPT) 10 MG tablet Take 1 tablet (10 mg total) by mouth at bedtime. 30 tablet 12  . FLUOCINOLONE ACETONIDE SCALP 0.01 % OIL apply to affected area ON SCALP EVERY NIGHT AT BEDTIME, WASH UPON AWAKENING  0  . fluticasone (CUTIVATE) 0.05 % cream apply to affected area ON FACE UP TO 2 TIMES A DAY AS NEEDED  0  . fluticasone (FLONASE) 50 MCG/ACT nasal spray Place 1 spray into both nostrils daily. (Patient taking differently: Place 1 spray into both nostrils daily as needed for allergies. ) 16 g 2  . glucose blood (ONETOUCH VERIO) test strip Use to check blood sugar 2 times per day. Dx code E11.65 100 each 2  . insulin aspart (NOVOLOG) 100 UNIT/ML injection Inject 10 Units into the skin 3 (three) times daily before meals. (Patient taking differently: Inject 10 Units into the skin See admin instructions. Two to three times a day before meals) 20 mL 2  . insulin glargine (LANTUS) 100 UNIT/ML  injection Inject 0.08 mLs (8 Units total) into the skin every morning. (Patient taking differently: Inject 10 Units into the skin every morning. )    . ketoconazole (NIZORAL) 2 % shampoo Apply 1 application topically 2 (two) times a week. 120 mL 0  . Lancets (FREESTYLE) lancets Use as instructed to check blood sugar 2 times per day dx code E11.65 100 each 3  . levothyroxine (SYNTHROID, LEVOTHROID) 50 MCG tablet Take 1 tablet (50 mcg total) by mouth daily. 90 tablet 1  . lidocaine-prilocaine (EMLA) cream Apply 1 application topically once. AS DIRECTED  1  . loratadine (CLARITIN) 10 MG tablet Take 10 mg by mouth daily as needed for allergies.     . metroNIDAZOLE (METROGEL) 0.75 % gel APPLY TO AFFECTED AREA ON SKIN  ON FACE TWICE DAILY  0  . mupirocin ointment (BACTROBAN) 2 % Use as directed to affected areas 22 g prn  . Naftifine HCl (NAFTIN) 2 % CREA  Apply to feet daily for fungal infection 60 g prn  . pioglitazone (ACTOS) 30 MG tablet take 1 tablet by mouth once daily 30 tablet 2  . RENVELA 800 MG tablet Take 800-2,400 mg by mouth See admin instructions. 1,600-2,400 mg with breakfast then 1,600 mg with lunch then 2,400 mg with dinner (evening meal) and 800 mg with each snack  0  . SENSIPAR 30 MG tablet Take 30 mg by mouth daily.   1  . simvastatin (ZOCOR) 20 MG tablet Take 20 mg by mouth at bedtime.     . triamcinolone ointment (KENALOG) 0.1 % Apply 1 application topically 2 (two) times daily. Use between thighs     No facility-administered medications prior to visit.     PAST MEDICAL HISTORY: Past Medical History:  Diagnosis Date  . Allergy   . Anemia   . Arthritis   . Coronary artery disease   . Diabetes mellitus    Type 2  . Diverticulitis   . ED (erectile dysfunction)   . 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 ESRD (end stage renal disease) on dialysis ([place]) 03/2015  . GERD (gastroesophageal reflux disease)    pepto   . Gout   . Hyperlipidemia   . Hypertension   . Hypothyroidism   .  Pneumonia   . PVD (peripheral vascular disease) ([place])    has plastic aorta  . Renal insufficiency   . Seasonal allergies   . Shortness of breath dyspnea   . Sleep apnea   . Thyroid disease     PAST SURGICAL HISTORY: Past Surgical History:  Procedure Laterality Date  . aortobifemoral bypass    . AV FISTULA PLACEMENT Left 12/01/2013   Procedure: ARTERIOVENOUS (AV) FISTULA CREATION- LEFT BRACHIOCEPHALIC;  Surgeon: Angelia Mould, MD;  Location: [facility];  Service: Vascular;  Laterality: Left;  . [place] TRANSPOSITION Right 07/27/2014   Procedure: BASCILIC VEIN TRANSPOSITION;  Surgeon: Angelia Mould, MD;  Location: [facility];  Service: Vascular;  Laterality: Right;  . BREAST SURGERY     left - granulomatous mastitis  . COLONOSCOPY    . 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 EYE SURGERY Bilateral    cataracts  . REVISON OF ARTERIOVENOUS FISTULA Left 02/09/2014   Procedure: REVISON OF LEFT ARTERIOVENOUS FISTULA - RESECTION OF RENDUNDANT VEIN;  Surgeon: Angelia Mould, MD;  Location: [facility];  Service: Vascular;  Laterality: Left;  . SBO with lysis adhesions    . SHUNTOGRAM Left 04/19/2014   Procedure: FISTULOGRAM;  Surgeon: Angelia Mould, MD;  [facility] C[facility] LAB;  Service: Cardiovascular;  Laterality: Left;  . UNILATERAL UPPER EXTREMEITY ANGIOGRAM N/A 07/12/2014   Procedure: UNILATERAL UPPER Anselmo Rod;  Surgeon: Angelia Mould, MD;  Location: [facility] CATH LAB;  Service: Cardiovascular;  Laterality: N/A;    FAMILY HISTORY: Family History  Problem Relation Age of Onset  . Aneurysm Mother   . Heart disease Father   . Stroke Father   . Hypertension Father   . Diabetes Father   . Dementia Neg Hx     SOCIAL HISTORY: Social History   Social History  . Marital status: Married    Spouse name: [name]  . Number of children: 1  . Years of education: 44   Occupational History  . Retired    Social History Main Topics  . Smoking  status: Former Smoker    Quit date: 09/24/1994  . Smokeless tobacco: Never Used  .  Alcohol use No     Comment: Quit Oct. 1977 ("somewhat heavy")  . Drug use: No  . Sexual activity: Not on file   Other Topics Concern  . Not on file   Social History Narrative   Lives at home with wife.   Caffeine use: Drinks no soda or tea.    Drinks 1 cup coffee/week        PHYSICAL EXAM  Vitals:   01/24/17 1023  Weight: 200 lb 9.6 oz (91 kg)  Height: _0  (1.803 m)   Body mass index is 27.98 kg/m.  Generalized: Well developed, in no acute distress ,Well-groomed Head: normocephalic and atraumatic,. Oropharynx benign  Neck: Supple, no carotid bruits  Musculoskeletal: No deformity   Neurological examination   Mentation: Alert oriented to time, place, history taking.  Follows all commands speech and language fluent.  Montreal Cognitive Assessment  01/24/2017 07/11/2016 05/20/2015  Visuospatial/ Executive (0/5) _1 Naming (0/3) _2 Attention: Read list of digits (0/2) _3 Attention: Read list of letters (0/1) _4 Attention: Serial 7 subtraction starting at 100 (0/3) _5 Language: Repeat phrase (0/2) _6 Language : Fluency (0/1) 1 0 0  Abstraction (0/2) _7 Delayed Recall (0/5) _8 Orientation (0/6) _9 Total _10 Adjusted Score (based on education) - - 22    Cranial nerve II-XII: .Pupils were equal round reactive to light extraocular movements were full, visual field were full on confrontational test. Facial sensation and strength were normal. hearing was intact to finger rubbing bilaterally. Uvula tongue midline. head turning and shoulder shrug were normal and symmetric.Tongue protrusion into cheek strength was normal. Motor: normal bulk and tone, full strength in the BUE, BLE, fine finger movements normal, no pronator drift. No focal weakness Sensory: normal and symmetric to light touch, pinprick, and  Vibration, in the upper and lower  extremities Coordination: finger-nose-finger, heel-to-shin bilaterally, no dysmetria Reflexes: Symmetric upper and lower, plantar responses were flexor bilaterally. Gait and Station: Rising up from seated position without assistance, normal stance,  moderate stride, good arm swing, smooth turning, able to perform tiptoe, and heel walking without difficulty. Tandem gait is steady  DIAGNOSTIC DATA (LABS, IMAGING, TESTING) - I reviewed patient records, labs, notes, testing and imaging myself where available.  Lab Results  Component Value Date   WBC 6.5 09/03/2016   HGB 11.2 (L) 09/03/2016   HCT 34.5 (L) 09/03/2016   MCV 107.8 (H) 09/03/2016   PLT 186 09/03/2016      Component Value Date/Time   NA 136 09/03/2016 1209   K 4.2 09/03/2016 1209   CL 93 (L) 09/03/2016 1209   CO2 30 09/03/2016 1209   GLUCOSE 56 (L) 12/06/2016 0910   BUN 54 (H) 09/03/2016 1209   CREATININE 9.18 (H) 09/03/2016 1209   CALCIUM 8.8 09/03/2016 1209   PROT 6.0 (L) 09/03/2016 1209   ALBUMIN 3.7 09/03/2016 1209   AST 22 09/03/2016 1209   ALT 11 09/03/2016 1209   ALKPHOS 102 09/03/2016 1209   BILITOT 0.3 09/03/2016 1209   GFRNONAA 5 (L) 09/03/2016 1209   GFRAA 6 (L) 09/03/2016 1209   Lab Results  Component Value Date   CHOL 85 09/03/2016   HDL 33 (L) 09/03/2016   LDLCALC 31 09/03/2016   TRIG 107 09/03/2016   CHOLHDL 2.6 09/03/2016   Lab Results  Component Value Date  HGBA1C 7.8 (H) 12/06/2016   Lab Results  Component Value Date   FMMCRFVO36 067 09/03/2016   Lab Results  Component Value Date   TSH 1.41 09/03/2016      ASSESSMENT AND PLAN [name] is a very nice 76 y.o. male here as a referral from Dr. [name] for memory problems. PMHx peripheral vascular disease, hypertension, hypothyroidism, hyperparathyroidism, diabetes, end-stage renal disease on dialysis, hyperlipidemia, b-12 deficiency on injections (B12 237 at that time). Neurologic exam is nonfocal. Montreal cognitive assessment 24  out of 30. MRI of the brain showed nonspecific white matter changes that were mild for age. Discussion with patient that his memory complaints are likely multifactorial. There is probably some mild cognitive impairment that's normal for age but his medical problems are likely contributing significantly to his complaints.  He had a  thorough dementia workup.      PLAN: Continue Aricept at current dose, meds through the [facility] MOCA score is stable 24/30.  Follow-up with [place] in 6 months for repeat testing Continue to stay active and exercises as tolerated, healthy diet Next with [name] Patient was advised to aggressively manage any modifiable risk factors for cognitive decline and be compliant with all medical treatments Strategies that can compensate for cognitive deficits are a structured routine, familiar environment multitasking should be avoided. Using a daily planner for appointments, keeping track of dates. I spent 25 min in total face to face time with the patient more than 50% of which was spent counseling and coordination of care, reviewing test results reviewing medications and discussing and reviewing the diagnosis of mild cognitive impairment and strategies that can compensate, see above Dennie Bible, [facility], [facility], APRN  [facility] [address] [place], [place] [address] [phone]

## 2017-01-24 NOTE — Patient Instructions (Addendum)
Continue Aricept at current dose, meds through the [facility] MOCA score is stable 23/30.  Follow-up with [place] in 6 months for repeat testing Next with [name]

## 2017-01-25 DIAGNOSIS — N2581 Secondary hyperparathyroidism of renal origin: Secondary | ICD-10-CM | POA: Diagnosis not present

## 2017-01-25 DIAGNOSIS — D631 Anemia in chronic kidney disease: Secondary | ICD-10-CM | POA: Diagnosis not present

## 2017-01-25 DIAGNOSIS — E1129 Type 2 diabetes mellitus with other diabetic kidney complication: Secondary | ICD-10-CM | POA: Diagnosis not present

## 2017-01-25 DIAGNOSIS — N186 End stage renal disease: Secondary | ICD-10-CM | POA: Diagnosis not present

## 2017-01-25 DIAGNOSIS — D509 Iron deficiency anemia, unspecified: Secondary | ICD-10-CM | POA: Diagnosis not present

## 2017-01-28 ENCOUNTER — Encounter: Payer: Medicare Other | Attending: Surgery | Admitting: Surgery

## 2017-01-28 DIAGNOSIS — E11622 Type 2 diabetes mellitus with other skin ulcer: Secondary | ICD-10-CM | POA: Diagnosis present

## 2017-01-28 DIAGNOSIS — M109 Gout, unspecified: Secondary | ICD-10-CM | POA: Insufficient documentation

## 2017-01-28 DIAGNOSIS — E1140 Type 2 diabetes mellitus with diabetic neuropathy, unspecified: Secondary | ICD-10-CM | POA: Diagnosis not present

## 2017-01-28 DIAGNOSIS — Z7982 Long term (current) use of aspirin: Secondary | ICD-10-CM | POA: Diagnosis not present

## 2017-01-28 DIAGNOSIS — E039 Hypothyroidism, unspecified: Secondary | ICD-10-CM | POA: Insufficient documentation

## 2017-01-28 DIAGNOSIS — E785 Hyperlipidemia, unspecified: Secondary | ICD-10-CM | POA: Insufficient documentation

## 2017-01-28 DIAGNOSIS — Z794 Long term (current) use of insulin: Secondary | ICD-10-CM | POA: Diagnosis not present

## 2017-01-28 DIAGNOSIS — Z79899 Other long term (current) drug therapy: Secondary | ICD-10-CM | POA: Insufficient documentation

## 2017-01-28 DIAGNOSIS — Z880 Allergy status to penicillin: Secondary | ICD-10-CM | POA: Diagnosis not present

## 2017-01-28 DIAGNOSIS — E1122 Type 2 diabetes mellitus with diabetic chronic kidney disease: Secondary | ICD-10-CM | POA: Diagnosis not present

## 2017-01-28 DIAGNOSIS — D649 Anemia, unspecified: Secondary | ICD-10-CM | POA: Diagnosis not present

## 2017-01-28 DIAGNOSIS — Z87891 Personal history of nicotine dependence: Secondary | ICD-10-CM | POA: Insufficient documentation

## 2017-01-28 DIAGNOSIS — E1129 Type 2 diabetes mellitus with other diabetic kidney complication: Secondary | ICD-10-CM | POA: Diagnosis not present

## 2017-01-28 DIAGNOSIS — N186 End stage renal disease: Secondary | ICD-10-CM | POA: Diagnosis not present

## 2017-01-28 DIAGNOSIS — Z992 Dependence on renal dialysis: Secondary | ICD-10-CM | POA: Insufficient documentation

## 2017-01-28 DIAGNOSIS — D631 Anemia in chronic kidney disease: Secondary | ICD-10-CM | POA: Diagnosis not present

## 2017-01-28 DIAGNOSIS — N2581 Secondary hyperparathyroidism of renal origin: Secondary | ICD-10-CM | POA: Diagnosis not present

## 2017-01-28 DIAGNOSIS — D509 Iron deficiency anemia, unspecified: Secondary | ICD-10-CM | POA: Diagnosis not present

## 2017-01-28 DIAGNOSIS — X58XXXD Exposure to other specified factors, subsequent encounter: Secondary | ICD-10-CM | POA: Insufficient documentation

## 2017-01-28 DIAGNOSIS — S31102D Unspecified open wound of abdominal wall, epigastric region without penetration into peritoneal cavity, subsequent encounter: Secondary | ICD-10-CM | POA: Diagnosis not present

## 2017-01-28 DIAGNOSIS — L98491 Non-pressure chronic ulcer of skin of other sites limited to breakdown of skin: Secondary | ICD-10-CM | POA: Diagnosis not present

## 2017-01-28 DIAGNOSIS — I120 Hypertensive chronic kidney disease with stage 5 chronic kidney disease or end stage renal disease: Secondary | ICD-10-CM | POA: Insufficient documentation

## 2017-01-28 NOTE — Progress Notes (Signed)
DILON, LANK ([id number]) Visit Report for 01/28/2017 Abuse/Suicide Risk Screen Details Patient Name: [name], Auxier. Date of Service: 01/28/2017 8:00 AM Medical Record Number: [id number] Patient Account Number: [id number] Date of Birth/Sex: 08-Jan-1941 (76 y.o. Male) Treating RN: Montey Hora Primary Care Malee Grays: Tedra Senegal Other Clinician: Referring Alexsandra Shontz: Tedra Senegal Treating Winford Hehn/Extender: Frann Rider in Treatment: 0 Abuse/Suicide Risk Screen Items Answer ABUSE/SUICIDE RISK SCREEN: Has anyone close to you tried to hurt or harm you recentlyo No Do you feel uncomfortable with anyone in your familyo No Has anyone forced you do things that you didnot want to doo No Do you have any thoughts of harming yourselfo No Patient displays signs or symptoms of abuse and/or neglect. No Electronic Signature(s) Signed: 01/28/2017 2:23:12 PM By: Montey Hora Entered By: Montey Hora on 01/28/2017 08:20:08 Hilley, [name] ([id number]) -------------------------------------------------------------------------------- Activities of Daily Living Details Patient Name: [name]. Date of Service: 01/28/2017 8:00 AM Medical Record Number: [id number] Patient Account Number: [id number] Date of Birth/Sex: [birth year] (76 y.o. Male) Treating RN: Montey Hora Primary Care Machai Desmith: Tedra Senegal Other Clinician: Referring Renell Allum: Tedra Senegal Treating Yaden Seith/Extender: Frann Rider in Treatment: 0 Activities of Daily Living Items Answer Activities of Daily Living (Please select one for each item) Drive Automobile Completely Able Take Medications Completely Able Use Telephone Completely Able Care for Appearance Completely Able Use Toilet Completely Able Bath / Shower Completely Able Dress Self Completely Able Feed Self Completely Able Walk Completely Able Get In / Out Bed Completely Able Housework Completely Able Prepare Meals Completely Alda for Self Completely Able Electronic Signature(s) Signed: 01/28/2017 2:23:12 PM By: Montey Hora Entered By: Montey Hora on 01/28/2017 08:20:23 Cuadra, [name] ([id number]) -------------------------------------------------------------------------------- Education Assessment Details Patient Name: [name]. Date of Service: 01/28/2017 8:00 AM Medical Record Number: [id number] Patient Account Number: [id number] Date of Birth/Sex: 1941-02-22 (76 y.o. Male) Treating RN: Montey Hora Primary Care Efosa Treichler: Tedra Senegal Other Clinician: Referring Keanon Bevins: Tedra Senegal Treating Tacha Manni/Extender: Frann Rider in Treatment: 0 Primary Learner Assessed: Patient Learning Preferences/Education Level/Primary Language Learning Preference: Explanation, Demonstration Highest Education Level: College or Above Preferred Language: English Cognitive Barrier Assessment/Beliefs Language Barrier: No Translator Needed: No Memory Deficit: No Emotional Barrier: No Cultural/Religious Beliefs Affecting Medical No Care: Physical Barrier Assessment Impaired Vision: No Impaired Hearing: No Decreased Hand dexterity: No Knowledge/Comprehension Assessment Knowledge Level: Medium Comprehension Level: Medium Ability to understand written Medium instructions: Ability to understand verbal Medium instructions: Motivation Assessment Anxiety Level: Calm Cooperation: Cooperative Education Importance: Acknowledges Need Interest in Health Problems: Asks Questions Perception: Coherent Willingness to Engage in Self- Medium Management Activities: Readiness to Engage in Self- Medium Management Activities: Electronic Signature(s) STANELY, SEXSON ([id number]) Signed: 01/28/2017 2:23:12 PM By: Montey Hora Entered By: Montey Hora on 01/28/2017 08:20:46 Crittendon, [name] ([id number]) -------------------------------------------------------------------------------- Fall  Risk Assessment Details Patient Name: [name]. Date of Service: 01/28/2017 8:00 AM Medical Record Number: [id number] Patient Account Number: [id number] Date of Birth/Sex: [birth year] (76 y.o. Male) Treating RN: Montey Hora Primary Care Aster Screws: Tedra Senegal Other Clinician: Referring Javel Hersh: Tedra Senegal Treating Versie Soave/Extender: Frann Rider in Treatment: 0 Fall Risk Assessment Items Have you had 2 or more falls in the last 12 monthso 0 No Have you had any fall that resulted in injury in the last 12 monthso 0 No FALL RISK ASSESSMENT: History of falling - immediate or within 3 months 0 No Secondary diagnosis 0 No Ambulatory aid None/bed rest/wheelchair/nurse 0 Yes Crutches/cane/walker 0 No Furniture  0 No IV Access/Saline Lock 0 No Gait/Training Normal/bed rest/immobile 0 Yes Weak 0 No Impaired 0 No Mental Status Oriented to own ability 0 Yes Electronic Signature(s) Signed: 01/28/2017 2:23:12 PM By: Montey Hora Entered By: Montey Hora on 01/28/2017 08:21:01 Landeck, [name] ([id number]) -------------------------------------------------------------------------------- Nutrition Risk Assessment Details Patient Name: [name]. Date of Service: 01/28/2017 8:00 AM Medical Record Number: [id number] Patient Account Number: [id number] Date of Birth/Sex: 07-Dec-1940 (76 y.o. Male) Treating RN: Montey Hora Primary Care Ramonica Grigg: Tedra Senegal Other Clinician: Referring Lothar Prehn: Tedra Senegal Treating Darryel Diodato/Extender: Frann Rider in Treatment: 0 Height (in): 71 Weight (lbs): 203 Body Mass Index (BMI): 28.3 Nutrition Risk Assessment Items NUTRITION RISK SCREEN: I have an illness or condition that made me change the kind and/or 0 No amount of food I eat I eat fewer than two meals per day 0 No I eat few fruits and vegetables, or milk products 0 No I have three or more drinks of beer, liquor or wine almost every day 0 No I have tooth or mouth  problems that make it hard for me to eat 0 No I don't always have enough money to buy the food I need 0 No I eat alone most of the time 0 No I take three or more different prescribed or over-the-counter drugs a 1 Yes day Without wanting to, I have lost or gained 10 pounds in the last six 0 No months I am not always physically able to shop, cook and/or feed myself 0 No Nutrition Protocols Good Risk Protocol 0 No interventions needed Moderate Risk Protocol Electronic Signature(s) Signed: 01/28/2017 2:23:12 PM By: Montey Hora Entered By: Montey Hora on 01/28/2017 08:21:08

## 2017-01-28 NOTE — Progress Notes (Signed)
RICKARDO, BRINEGAR ([id number]) Visit Report for 01/28/2017 Allergy List Details Patient Name: [name], [name]. Date of Service: 01/28/2017 8:00 AM Medical Record Number: [id number] Patient Account Number: [id number] Date of Birth/Sex: [birth year] (76 y.o. Male) Treating RN: Montey Hora Primary Care Eldo Umanzor: Tedra Senegal Other Clinician: Referring Pate Aylward: Tedra Senegal Treating Naman Spychalski/Extender: Frann Rider in Treatment: 0 Allergies Active Allergies penicillin Allergy Notes Electronic Signature(s) Signed: 01/28/2017 2:23:12 PM By: Montey Hora Entered By: Montey Hora on 01/28/2017 08:19:25 [name], [name] ([id number]) -------------------------------------------------------------------------------- Arrival Information Details Patient Name: [name]. Date of Service: 01/28/2017 8:00 AM Medical Record Number: [id number] Patient Account Number: [id number] Date of Birth/Sex: 1941-01-13 (76 y.o. Male) Treating RN: Montey Hora Primary Care Kaysha Parsell: Tedra Senegal Other Clinician: Referring Wisdom Rickey: Tedra Senegal Treating Akyia Borelli/Extender: Frann Rider in Treatment: 0 Visit Information Patient Arrived: Ambulatory Arrival Time: 08:17 Accompanied By: self Transfer Assistance: None Patient Identification Verified: Yes Secondary Verification Process Yes Completed: Patient Has Alerts: Yes Patient Alerts: Patient on Blood Thinner DMII aspirin 650mg  History Since Last Visit Added or deleted any medications: No Any new allergies or adverse reactions: No Had a fall or experienced change in activities of daily living that may affect risk of falls: No Signs or symptoms of abuse/neglect since last visito No Hospitalized since last visit: No Electronic Signature(s) Signed: 01/28/2017 2:23:12 PM By: Montey Hora Entered By: Montey Hora on 01/28/2017 08:18:36 [name], [name]  ([id number]) -------------------------------------------------------------------------------- Clinic Level of Care Assessment Details Patient Name: [name]. Date of Service: 01/28/2017 8:00 AM Medical Record Number: [id number] Patient Account Number: [id number] Date of Birth/Sex: [birth year] (76 y.o. Male) Treating RN: Montey Hora Primary Care Chaundra Abreu: Tedra Senegal Other Clinician: Referring Reveca Desmarais: Tedra Senegal Treating Giliana Vantil/Extender: Frann Rider in Treatment: 0 Clinic Level of Care Assessment Items TOOL 2 Quantity Score []  - Use when only an EandM is performed on the INITIAL visit 0 ASSESSMENTS - Nursing Assessment / Reassessment X - General Physical Exam (combine w/ comprehensive assessment (listed just 1 20 below) when performed on new pt. evals) X - Comprehensive Assessment (HX, ROS, Risk Assessments, Wounds Hx, etc.) 1 25 ASSESSMENTS - Wound and Skin Assessment / Reassessment X - Simple Wound Assessment / Reassessment - one wound 1 5 []  - Complex Wound Assessment / Reassessment - multiple wounds 0 []  - Dermatologic / Skin Assessment (not related to wound area) 0 ASSESSMENTS - Ostomy and/or Continence Assessment and Care []  - Incontinence Assessment and Management 0 []  - Ostomy Care Assessment and Management (repouching, etc.) 0 PROCESS - Coordination of Care X - Simple Patient / Family Education for ongoing care 1 15 []  - Complex (extensive) Patient / Family Education for ongoing care 0 []  - Staff obtains Programmer, systems, Records, Test Results / Process Orders 0 []  - Staff telephones HHA, Nursing Homes / Clarify orders / etc 0 []  - Routine Transfer to another Facility (non-emergent condition) 0 []  - Routine Hospital Admission (non-emergent condition) 0 X - New Admissions / Biomedical engineer / Ordering NPWT, Apligraf, etc. 1 15 []  - Emergency Hospital Admission (emergent condition) 0 []  - Simple Discharge Coordination 0 [name], [name] ([id number]) []  -  Complex (extensive) Discharge Coordination 0 PROCESS - Special Needs []  - Pediatric / Minor Patient Management 0 []  - Isolation Patient Management 0 []  - Hearing / Language / Visual special needs 0 []  - Assessment of Community assistance (transportation, D/C planning, etc.) 0 []  - Additional assistance / Altered mentation 0 []  - Support Surface(s) Assessment (bed, cushion, seat, etc.)  0 INTERVENTIONS - Wound Cleansing / Measurement X - Wound Imaging (photographs - any number of wounds) 1 5 []  - Wound Tracing (instead of photographs) 0 X - Simple Wound Measurement - one wound 1 5 []  - Complex Wound Measurement - multiple wounds 0 X - Simple Wound Cleansing - one wound 1 5 []  - Complex Wound Cleansing - multiple wounds 0 INTERVENTIONS - Wound Dressings X - Small Wound Dressing one or multiple wounds 1 10 []  - Medium Wound Dressing one or multiple wounds 0 []  - Large Wound Dressing one or multiple wounds 0 []  - Application of Medications - injection 0 INTERVENTIONS - Miscellaneous []  - External ear exam 0 []  - Specimen Collection (cultures, biopsies, blood, body fluids, etc.) 0 []  - Specimen(s) / Culture(s) sent or taken to Lab for analysis 0 []  - Patient Transfer (multiple staff / Civil Service fast streamer / Similar devices) 0 []  - Simple Staple / Suture removal (25 or less) 0 []  - Complex Staple / Suture removal (26 or more) 0 [name], [name] ([id number]) []  - Hypo / Hyperglycemic Management (close monitor of Blood Glucose) 0 []  - Ankle / Brachial Index (ABI) - do not check if billed separately 0 Has the patient been seen at the hospital within the last three years: Yes Total Score: 105 Level Of Care: New/Established - Level 3 Electronic Signature(s) Signed: 01/28/2017 2:23:12 PM By: Montey Hora Entered By: Montey Hora on 01/28/2017 08:41:02 [name], [name] ([id number]) -------------------------------------------------------------------------------- Encounter Discharge Information  Details Patient Name: [name]. Date of Service: 01/28/2017 8:00 AM Medical Record Number: [id number] Patient Account Number: [id number] Date of Birth/Sex: 09/09/1941 (76 y.o. Male) Treating RN: Montey Hora Primary Care Olanna Percifield: Tedra Senegal Other Clinician: Referring Asha Grumbine: Tedra Senegal Treating Solan Vosler/Extender: Frann Rider in Treatment: 0 Encounter Discharge Information Items Discharge Pain Level: 0 Discharge Condition: Stable Ambulatory Status: Ambulatory Discharge Destination: Home Transportation: Private Auto Accompanied By: self Schedule Follow-up Appointment: Yes Medication Reconciliation completed and provided to Patient/Care No Yovan Leeman: Provided on Clinical Summary of Care: 01/28/2017 Form Type Recipient Paper Patient WT Electronic Signature(s) Signed: 01/28/2017 8:51:29 AM By: Ruthine Dose Entered By: Ruthine Dose on 01/28/2017 08:51:28 [name], [name] ([id number]) -------------------------------------------------------------------------------- Multi Wound Chart Details Patient Name: [name]. Date of Service: 01/28/2017 8:00 AM Medical Record Number: [id number] Patient Account Number: [id number] Date of Birth/Sex: August 25, 1941 (76 y.o. Male) Treating RN: Montey Hora Primary Care Sky Borboa: Tedra Senegal Other Clinician: Referring Peony Barner: Tedra Senegal Treating Richmond Coldren/Extender: Frann Rider in Treatment: 0 Vital Signs Height(in): 72 Pulse(bpm): 64 Weight(lbs): 201 Blood Pressure 167/46 (mmHg): Body Mass Index(BMI): 27 Temperature(F): 98.3 Respiratory Rate 18 (breaths/min): Photos: [N/A:N/A] Wound Location: Abdomen - midline N/A N/A Wounding Event: Gradually Appeared N/A N/A Primary Etiology: Pressure Ulcer N/A N/A Comorbid History: Anemia, Hypertension, N/A N/A Peripheral Venous Disease, Type II Diabetes, Gout, Neuropathy Date Acquired: 01/14/2017 N/A N/A Weeks of Treatment: 0 N/A N/A Wound Status: Open N/A  N/A Measurements L x W x D 1.7x2x0.1 N/A N/A (cm) Area (cm) : 2.67 N/A N/A Volume (cm) : 0.267 N/A N/A % Reduction in Area: 0.00% N/A N/A % Reduction in Volume: 0.00% N/A N/A Classification: Category/Stage II N/A N/A Exudate Amount: Large N/A N/A Exudate Type: Serous N/A N/A Exudate Color: amber N/A N/A Wound Margin: Flat and Intact N/A N/A Granulation Amount: Large (67-100%) N/A N/A Granulation Quality: Red N/A N/A [name], [name] ([id number]) Necrotic Amount: Small (1-33%) N/A N/A Exposed Structures: Fascia: No N/A N/A Fat Layer (Subcutaneous Tissue) Exposed: No Tendon:  No Muscle: No Joint: No Bone: No Limited to Skin Breakdown Epithelialization: None N/A N/A Periwound Skin Texture: Excoriation: No N/A N/A Induration: No Callus: No Crepitus: No Rash: No Scarring: No Periwound Skin Maceration: No N/A N/A Moisture: Dry/Scaly: No Periwound Skin Color: Atrophie Blanche: No N/A N/A Cyanosis: No Ecchymosis: No Erythema: No Hemosiderin Staining: No Mottled: No Pallor: No Rubor: No Temperature: No Abnormality N/A N/A Tenderness on No N/A N/A Palpation: Wound Preparation: Ulcer Cleansing: N/A N/A Rinsed/Irrigated with Saline Topical Anesthetic Applied: Other: lidocaine 4% Treatment Notes Wound #2 (Abdomen - midline) 1. Cleansed with: Clean wound with Normal Saline 2. Anesthetic Topical Lidocaine 4% cream to wound bed prior to debridement 4. Dressing Applied: Hydrafera Blue 5. Secondary Dressing Applied Bordered Foam Dressing Roig, KEVONTAY BURKS ([id number]) Electronic Signature(s) Signed: 01/28/2017 8:53:38 AM By: Christin Fudge MD, FACS Previous Signature: 01/28/2017 8:34:13 AM Version By: Montey Hora Entered By: Christin Fudge on 01/28/2017 08:53:38 Devivo, [name] ([id number]) -------------------------------------------------------------------------------- [place] Details Patient Name: [name]. Date of Service: 01/28/2017  8:00 AM Medical Record Number: [id number] Patient Account Number: [id number] Date of Birth/Sex: April 14, 1941 (76 y.o. Male) Treating RN: Montey Hora Primary Care Armenta Erskin: Tedra Senegal Other Clinician: Referring Jacon Whetzel: Tedra Senegal Treating Bryton Romagnoli/Extender: Frann Rider in Treatment: 0 Active Inactive ` Orientation to the Wound Care Program Nursing Diagnoses: Knowledge deficit related to the wound healing center program Goals: Patient/caregiver will verbalize understanding of the [place] Program Date Initiated: 01/28/2017 Target Resolution Date: 04/19/2017 Goal Status: Active Interventions: Provide education on orientation to the wound center Notes: ` Wound/Skin Impairment Nursing Diagnoses: Impaired tissue integrity Goals: Patient/caregiver will verbalize understanding of skin care regimen Date Initiated: 01/28/2017 Target Resolution Date: 04/19/2017 Goal Status: Active Ulcer/skin breakdown will have a volume reduction of 30% by week 4 Date Initiated: 01/28/2017 Target Resolution Date: 04/19/2017 Goal Status: Active Ulcer/skin breakdown will have a volume reduction of 50% by week 8 Date Initiated: 01/28/2017 Target Resolution Date: 04/19/2017 Goal Status: Active Ulcer/skin breakdown will have a volume reduction of 80% by week 12 Date Initiated: 01/28/2017 Target Resolution Date: 04/19/2017 Goal Status: Active Ulcer/skin breakdown will heal within 14 weeks Date Initiated: 01/28/2017 Target Resolution Date: 04/19/2017 [name], [name] ([id number]) Goal Status: Active Interventions: Assess patient/caregiver ability to obtain necessary supplies Assess patient/caregiver ability to perform ulcer/skin care regimen upon admission and as needed Assess ulceration(s) every visit Notes: Electronic Signature(s) Signed: 01/28/2017 8:33:57 AM By: Montey Hora Entered By: Montey Hora on 01/28/2017 08:33:56 [name], [name]  ([id number]) -------------------------------------------------------------------------------- Pain Assessment Details Patient Name: [name]. Date of Service: 01/28/2017 8:00 AM Medical Record Number: [id number] Patient Account Number: [id number] Date of Birth/Sex: 07-03-1941 (76 y.o. Male) Treating RN: Montey Hora Primary Care Keshon Markovitz: Tedra Senegal Other Clinician: Referring Marquitta Persichetti: Tedra Senegal Treating Karee Forge/Extender: Frann Rider in Treatment: 0 Active Problems Location of Pain Severity and Description of Pain Patient Has Paino No Site Locations Pain Management and Medication Current Pain Management: Notes Topical or injectable lidocaine is offered to patient for acute pain when surgical debridement is performed. If needed, Patient is instructed to use over the counter pain medication for the following 24-48 hours after debridement. Wound care MDs do not prescribed pain medications. Patient has chronic pain or uncontrolled pain. Patient has been instructed to make an appointment with their Primary Care Physician for pain management. Electronic Signature(s) Signed: 01/28/2017 2:23:12 PM By: Montey Hora Entered By: Montey Hora on 01/28/2017 08:18:43 [name], [name] ([id number]) -------------------------------------------------------------------------------- Patient/Caregiver Education Details Patient Name:  [name], [name]. Date of Service: 01/28/2017 8:00 AM Medical Record Number: [id number] Patient Account Number: [id number] Date of Birth/Gender: 28-May-1941 (76 y.o. Male) Treating RN: Montey Hora Primary Care Physician: Tedra Senegal Other Clinician: Referring Physician: Tedra Senegal Treating Physician/Extender: Frann Rider in Treatment: 0 Education Assessment Education Provided To: Patient Education Topics Provided Wound/Skin Impairment: Handouts: Other: wound care as ordered Methods: Explain/Verbal Responses: State content  correctly Electronic Signature(s) Signed: 01/28/2017 2:23:12 PM By: Montey Hora Entered By: Montey Hora on 01/28/2017 08:38:06 [name], [name] ([id number]) -------------------------------------------------------------------------------- Wound Assessment Details Patient Name: [name]. Date of Service: 01/28/2017 8:00 AM Medical Record Number: [id number] Patient Account Number: [id number] Date of Birth/Sex: [birth year] (76 y.o. Male) Treating RN: Montey Hora Primary Care Tennile Styles: Tedra Senegal Other Clinician: Referring Roshan Roback: Tedra Senegal Treating Chesney Klimaszewski/Extender: Frann Rider in Treatment: 0 Wound Status Wound Number: 2 Primary Pressure Ulcer Etiology: Wound Location: Abdomen - midline Wound Open Wounding Event: Gradually Appeared Status: Date Acquired: 01/14/2017 Comorbid Anemia, Hypertension, Peripheral Weeks Of Treatment: 0 History: Venous Disease, Type II Diabetes, Clustered Wound: No Gout, Neuropathy Photos Wound Measurements Length: (cm) 1.7 Width: (cm) 2 Depth: (cm) 0.1 Area: (cm) 2.67 Volume: (cm) 0.267 % Reduction in Area: 0% % Reduction in Volume: 0% Epithelialization: None Tunneling: No Undermining: No Wound Description Classification: Category/Stage II Wound Margin: Flat and Intact Exudate Amount: Large Exudate Type: Serous Exudate Color: amber Foul Odor After Cleansing: No Slough/Fibrino Yes Wound Bed Granulation Amount: Large (67-100%) Exposed Structure Granulation Quality: Red Fascia Exposed: No Necrotic Amount: Small (1-33%) Fat Layer (Subcutaneous Tissue) Exposed: No Necrotic Quality: Adherent Slough Tendon Exposed: No Muscle Exposed: No Shishido, [name] ([id number]) Joint Exposed: No Bone Exposed: No Limited to Skin Breakdown Periwound Skin Texture Texture Color No Abnormalities Noted: No No Abnormalities Noted: No Callus: No Atrophie Blanche: No Crepitus: No Cyanosis: No Excoriation: No Ecchymosis:  No Induration: No Erythema: No Rash: No Hemosiderin Staining: No Scarring: No Mottled: No Pallor: No Moisture Rubor: No No Abnormalities Noted: No Dry / Scaly: No Temperature / Pain Maceration: No Temperature: No Abnormality Wound Preparation Ulcer Cleansing: Rinsed/Irrigated with Saline Topical Anesthetic Applied: Other: lidocaine 4%, Treatment Notes Wound #2 (Abdomen - midline) 1. Cleansed with: Clean wound with Normal Saline 2. Anesthetic Topical Lidocaine 4% cream to wound bed prior to debridement 4. Dressing Applied: Hydrafera Blue 5. Secondary Dressing Applied Bordered Foam Dressing Electronic Signature(s) Signed: 01/28/2017 8:35:06 AM By: Montey Hora Entered By: Montey Hora on 01/28/2017 08:35:06 Bhardwaj, [name] ([id number]) -------------------------------------------------------------------------------- [place] Details Patient Name: [name]. Date of Service: 01/28/2017 8:00 AM Medical Record Number: [id number] Patient Account Number: [id number] Date of Birth/Sex: [birth year] (76 y.o. Male) Treating RN: Montey Hora Primary Care Gabby Rackers: Tedra Senegal Other Clinician: Referring Ashanti Littles: Tedra Senegal Treating Ashwini Jago/Extender: Frann Rider in Treatment: 0 Vital Signs Time Taken: 08:21 Temperature (F): 98.3 Height (in): 72 Pulse (bpm): 64 Source: Measured Respiratory Rate (breaths/min): 18 Weight (lbs): 201 Blood Pressure (mmHg): 167/46 Source: Measured Reference Range: 80 - 120 mg / dl Body Mass Index (BMI): 27.3 Electronic Signature(s) Signed: 01/28/2017 2:23:12 PM By: Montey Hora Entered By: Montey Hora on 01/28/2017 08:24:18

## 2017-01-29 ENCOUNTER — Ambulatory Visit (INDEPENDENT_AMBULATORY_CARE_PROVIDER_SITE_OTHER): Payer: Medicare Other | Admitting: Internal Medicine

## 2017-01-29 ENCOUNTER — Encounter: Payer: Self-pay | Admitting: Internal Medicine

## 2017-01-29 ENCOUNTER — Ambulatory Visit
Admission: RE | Admit: 2017-01-29 | Discharge: 2017-01-29 | Disposition: A | Discharge status: Home / Self Care | Payer: Medicare Other | Source: Ambulatory Visit | Attending: Internal Medicine | Admitting: Internal Medicine

## 2017-01-29 VITALS — BP 120/88 | HR 68 | Temp 98.3°F | Wt 196.0 lb

## 2017-01-29 DIAGNOSIS — Z992 Dependence on renal dialysis: Secondary | ICD-10-CM | POA: Diagnosis not present

## 2017-01-29 DIAGNOSIS — R05 Cough: Secondary | ICD-10-CM | POA: Diagnosis not present

## 2017-01-29 DIAGNOSIS — J069 Acute upper respiratory infection, unspecified: Secondary | ICD-10-CM | POA: Diagnosis not present

## 2017-01-29 DIAGNOSIS — N186 End stage renal disease: Secondary | ICD-10-CM | POA: Diagnosis not present

## 2017-01-29 DIAGNOSIS — R059 Cough, unspecified: Secondary | ICD-10-CM

## 2017-01-29 MED ORDER — AZITHROMYCIN 250 MG PO TABS: ORAL_TABLET | ORAL | 0 refills | Status: DC

## 2017-01-29 NOTE — Progress Notes (Signed)
   Subjective:    Patient ID: [name], male    DOB: Jul 01, 1941, 76 y.o.   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  Afebrile. Had some discomfort in chest. No significant sputum production.   Review of Systems see above     Objective:   Physical Exam  Pulse ox is WNL. VSS TMs and pharynx clear. Skin warm and dry.Neck is supple without adenopathy. Chest clear to auscultation without rales or wheezing. Cardiac exam regular rate and rhythm.      Assessment & Plan:   Acute URI Plan: Pt got Z pak in 2016. Will use lower loading dose day 1  Of 1 tab instead of 2 tabs. He will take one a day x 5 days. To Have CXR.  Addendum: Chest x-ray is negative for pneumonia. He is to call if not better in 20 4048 hours or sooner if worse.

## 2017-01-29 NOTE — Progress Notes (Signed)
HENRICK, MCGUE ([id number]) Visit Report for 01/28/2017 Chief Complaint Document Details Patient Name: [name], [name]. Date of Service: 01/28/2017 8:00 AM Medical Record Number: [id number] Patient Account Number: [id number] Date of Birth/Sex: Sep 19, 1941 (76 y.o. Male) Treating RN: Montey Hora Primary Care Provider: Tedra Senegal Other Clinician: Referring Provider: Tedra Senegal Treating Provider/Extender: Frann Rider in Treatment: 0 Information Obtained from: Patient Chief Complaint Patients presents for treatment of an open diabetic ulcer to the periumbilical area which he has had for 2 weeks. he had a similar problem in January of this year and the ulcer healed with offloading and local care Electronic Signature(s) Signed: 01/28/2017 8:54:18 AM By: Christin Fudge MD, FACS Entered By: Christin Fudge on 01/28/2017 08:54:18 Grosser, [name] ([id number]) -------------------------------------------------------------------------------- HPI Details Patient Name: [name]. Date of Service: 01/28/2017 8:00 AM Medical Record Number: [id number] Patient Account Number: [id number] Date of Birth/Sex: 07-Dec-1940 (76 y.o. Male) Treating RN: Montey Hora Primary Care Provider: Tedra Senegal Other Clinician: Referring Provider: Tedra Senegal Treating Provider/Extender: Frann Rider in Treatment: 0 History of Present Illness Location: periumbilical area. is on the left of the midline Quality: Patient reports No Pain. Severity: Patient states wound (s) are getting better. Duration: Patient has had the wound for < 2 weeks prior to presenting for treatment Context: The wound appeared gradually over time Modifying Factors: Patient is currently on renal dialysis and receives treatments 3 times weekly Associated Signs and Symptoms: Patient reports having:then local care with hydrogen peroxide and Bactroban ointment HPI Description: 76 year old gentleman seen earlier this year in January  for a abdominal wound is now back with the same problem which has recurred for about 2 weeks and he has been trying to apply some local antibiotic ointment and a Band-Aid there. Of note he has significant pressure in this area due to his beltline and may have put on some weight which results in his trousers being very tight around the waist. The else has changed in his HandP and his diabetes is pretty well controlled. most recent hemoglobin A1c on 12/06/2016 was 7.8%. he was reviewed by his endocrinologist Dr. Dwyane Dee who made appropriate modifications to his treatment plan. he was recently seen by neurology for mild cognitive impairment that was found to be normal for his age ====== Old notes The 76 year old gentleman who has a past medical history of diabetes mellitus, end-stage renal disease on dialysis, hypertension, secondary hyperparathyroidism, peripheral vascular disease and history of previous arterial bifemoral bypass graft in 1992 also has a history of gout, hyperlipidemia, hypothyroidism. last hemoglobin A1c was 7.5%. status post AV fistula placement, breast surgery for left granulomatous mastitis, triple a repair in 1992, small bowel obstruction, upper arm angiograms for AV fistula on the left side. was noted to have a superficial skin wound in the periumbilical area since late December 2017. He was asked to clean it with hydrogen peroxide and apply Bactroban. He was seen by vascular surgery in October 2017 where his right ABI was 0.56 left ABI was 0.87 and right TBI was 0.69 and left TBI was 0.70 ===== Electronic Signature(s) Signed: 01/28/2017 8:59:46 AM By: Christin Fudge MD, FACS Previous Signature: 01/28/2017 8:27:24 AM Version By: Christin Fudge MD, FACS Previous Signature: 01/28/2017 8:22:22 AM Version By: Christin Fudge MD, FACS Entered By: Christin Fudge on 01/28/2017 08:59:46 Mcjunkins, [name] ([id number]) Lieber, [name]  ([id number]) -------------------------------------------------------------------------------- Physical Exam Details Patient Name: [name]. Date of Service: 01/28/2017 8:00 AM Medical Record Number: [id number] Patient Account Number: [id number] Date of  Birth/Sex: [birth year] (76 y.o. Male) Treating RN: Montey Hora Primary Care Provider: Tedra Senegal Other Clinician: Referring Provider: Tedra Senegal Treating Provider/Extender: Frann Rider in Treatment: 0 Constitutional . Pulse regular. Respirations normal and unlabored. Afebrile. . Eyes Nonicteric. Reactive to light. Ears, Nose, Mouth, and Throat Lips, teeth, and gums WNL.Marland Kitchen Moist mucosa without lesions. Neck supple and nontender. No palpable supraclavicular or cervical adenopathy. Normal sized without goiter. Respiratory WNL. No retractions.. Cardiovascular Pedal Pulses WNL. No clubbing, cyanosis or edema. Gastrointestinal (GI) Abdomen without masses or tenderness.. No liver or spleen enlargement or tenderness.. Lymphatic No adneopathy. No adenopathy. No adenopathy. Musculoskeletal Adexa without tenderness or enlargement.. Digits and nails w/o clubbing, cyanosis, infection, petechiae, ischemia, or inflammatory conditions.. Integumentary (Hair, Skin) No suspicious lesions. No crepitus or fluctuance. No peri-wound warmth or erythema. No masses.Marland Kitchen Psychiatric Judgement and insight Intact.. No evidence of depression, anxiety, or agitation.. Notes the wound is in the periumbilical area to the left of the midline and has some hyper granulation tissue. Though it does not look like a malignant lesion at some stage if does not heal appropriately the main need to biopsy this area. No sharp debridement was required today. Electronic Signature(s) Signed: 01/28/2017 9:02:18 AM By: Christin Fudge MD, FACS Entered By: Christin Fudge on 01/28/2017 09:02:18 Gervais, [name]  ([id number]) -------------------------------------------------------------------------------- Physician Orders Details Patient Name: [name]. Date of Service: 01/28/2017 8:00 AM Medical Record Number: [id number] Patient Account Number: [id number] Date of Birth/Sex: 06-Jun-1941 (76 y.o. Male) Treating RN: Montey Hora Primary Care Provider: Tedra Senegal Other Clinician: Referring Provider: Tedra Senegal Treating Provider/Extender: Frann Rider in Treatment: 0 Verbal / Phone Orders: No Diagnosis Coding Wound Cleansing Wound #2 Abdomen - midline o Clean wound with Normal Saline. o May Shower, gently pat wound dry prior to applying new dressing. Anesthetic Wound #2 Abdomen - midline o Topical Lidocaine 4% cream applied to wound bed prior to debridement Primary Wound Dressing Wound #2 Abdomen - midline o Hydrafera Blue Secondary Dressing Wound #2 Abdomen - midline o Dry Gauze o Boardered Foam Dressing Dressing Change Frequency Wound #2 Abdomen - midline o Change dressing every other day. Follow-up Appointments Wound #2 Abdomen - midline o Return Appointment in 1 week. Additional Orders / Instructions Wound #2 Abdomen - midline o Increase protein intake. o Other: - Please add vitamin A, vitamin C and zinc supplements to your diet Electronic Signature(s) Signed: 01/28/2017 2:23:12 PM By: Montey Hora Signed: 01/28/2017 4:28:20 PM By: Christin Fudge MD, FACS Payano, [name] ([id number]) Entered By: Montey Hora on 01/28/2017 08:44:01 Hoar, [name] ([id number]) -------------------------------------------------------------------------------- Problem List Details Patient Name: [name]. Date of Service: 01/28/2017 8:00 AM Medical Record Number: [id number] Patient Account Number: [id number] Date of Birth/Sex: [birth year] (76 y.o. Male) Treating RN: Montey Hora Primary Care Provider: Tedra Senegal Other Clinician: Referring Provider: Tedra Senegal Treating Provider/Extender: Frann Rider in Treatment: 0 Active Problems ICD-10 Encounter Code Description Active Date Diagnosis E11.622 Type 2 diabetes mellitus with other skin ulcer 01/28/2017 Yes S31.102D Unspecified open wound of abdominal wall, epigastric 01/28/2017 Yes region without penetration into peritoneal cavity, subsequent encounter Z99.2 Dependence on renal dialysis 01/28/2017 Yes Inactive Problems Resolved Problems Electronic Signature(s) Signed: 01/28/2017 8:53:25 AM By: Christin Fudge MD, FACS Entered By: Christin Fudge on 01/28/2017 08:53:25 Timothy, [name] ([id number]) -------------------------------------------------------------------------------- Progress Note Details Patient Name: [name]. Date of Service: 01/28/2017 8:00 AM Medical Record Number: [id number] Patient Account Number: [id number] Date of Birth/Sex: 05-Jan-1941 (76 y.o. Male) Treating RN: Montey Hora  Primary Care Provider: Tedra Senegal Other Clinician: Referring Provider: Tedra Senegal Treating Provider/Extender: Frann Rider in Treatment: 0 Subjective Chief Complaint Information obtained from Patient Patients presents for treatment of an open diabetic ulcer to the periumbilical area which he has had for 2 weeks. he had a similar problem in January of this year and the ulcer healed with offloading and local care History of Present Illness (HPI) The following HPI elements were documented for the patient's wound: Location: periumbilical area. is on the left of the midline Quality: Patient reports No Pain. Severity: Patient states wound (s) are getting better. Duration: Patient has had the wound for < 2 weeks prior to presenting for treatment Context: The wound appeared gradually over time Modifying Factors: Patient is currently on renal dialysis and receives treatments 3 times weekly Associated Signs and Symptoms: Patient reports having:then local care with hydrogen peroxide  and Bactroban ointment 76 year old gentleman seen earlier this year in January for a abdominal wound is now back with the same problem which has recurred for about 2 weeks and he has been trying to apply some local antibiotic ointment and a Band-Aid there. Of note he has significant pressure in this area due to his beltline and may have put on some weight which results in his trousers being very tight around the waist. The else has changed in his HandP and his diabetes is pretty well controlled. most recent hemoglobin A1c on 12/06/2016 was 7.8%. he was reviewed by his endocrinologist Dr. Dwyane Dee who made appropriate modifications to his treatment plan. he was recently seen by neurology for mild cognitive impairment that was found to be normal for his age ====== Old notes The 76 year old gentleman who has a past medical history of diabetes mellitus, end-stage renal disease on dialysis, hypertension, secondary hyperparathyroidism, peripheral vascular disease and history of previous arterial bifemoral bypass graft in 1992 also has a history of gout, hyperlipidemia, hypothyroidism. last hemoglobin A1c was 7.5%. status post AV fistula placement, breast surgery for left granulomatous mastitis, triple a repair in 1992, small bowel obstruction, upper arm angiograms for AV fistula on the left side. was noted to have a superficial skin wound in the periumbilical area since late December 2017. He was asked to clean it with hydrogen peroxide and apply Bactroban. He was seen by vascular surgery in October 2017 where his right ABI was 0.56 left ABI was 0.87 and right Torok, [name]. ([id number]) TBI was 0.69 and left TBI was 0.70 ===== Wound History Patient presents with 1 open wound that has been present for approximately 2 weeks. Patient has been treating wound in the following manner: aquacel ag. Laboratory tests have not been performed in the last month. Patient reportedly has not tested positive for  an antibiotic resistant organism. Patient reportedly has not tested positive for osteomyelitis. Patient reportedly has not had testing performed to evaluate circulation in the legs. Patient History Information obtained from Patient. Allergies penicillin Social History Former smoker - quit in 1996, Marital Status - Married, Alcohol Use - Never - quit in 1976, Drug Use - No History, Caffeine Use - Never. 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 Medications fluocinolone acetonide scalp topical oil topical aspirin 325 mg tablet,delayed release oral tablet,delayed release (DR/EC) oral acetaminophen 500 mg tablet oral tablet oral loratadine 10 mg tablet oral tablet oral pioglitazone 30 mg tablet oral tablet oral simvastatin 20 mg tablet oral tablet oral Sensipar 30 mg tablet oral tablet oral donepezil 10  mg tablet oral tablet oral Renvela 800 mg tablet oral tablet oral allopurinol 100 mg tablet oral tablet oral Lantus U-100 Insulin 100 unit/mL subcutaneous solution subcutaneous solution subcutaneous Novolog Flexpen U-100 Insulin aspart 100 unit/mL subcutaneous subcutaneous insulin pen subcutaneous acetaminophen 300 mg-codeine 30 mg tablet oral tablet oral fluticasone 50 mcg/actuation nasal spray,suspension nasal spray,suspension nasal metronidazole 0.75 % topical gel topical gel topical levothyroxine 50 mcg tablet oral tablet oral Loyola, [name] ([id number]) mupirocin 2 % topical ointment topical ointment topical ketoconazole 2 % shampoo topical shampoo topical naftifine 2 % topical cream topical cream topical fluticasone 0.05 % topical cream topical cream topical Temovate 0.05 % topical cream topical cream topical triamcinolone acetonide 0.1 % topical ointment topical ointment topical lidocaine-prilocaine 2.5 %-2.5 % topical cream topical cream topical Objective Constitutional Pulse  regular. Respirations normal and unlabored. Afebrile. Vitals Time Taken: 8:21 AM, Height: 72 in, Source: Measured, Weight: 201 lbs, Source: Measured, BMI: 27.3, Temperature: 98.3 F, Pulse: 64 bpm, Respiratory Rate: 18 breaths/min, Blood Pressure: 167/46 mmHg. Eyes Nonicteric. Reactive to light. Ears, Nose, Mouth, and Throat Lips, teeth, and gums WNL.Marland Kitchen Moist mucosa without lesions. Neck supple and nontender. No palpable supraclavicular or cervical adenopathy. Normal sized without goiter. Respiratory WNL. No retractions.. Cardiovascular Pedal Pulses WNL. No clubbing, cyanosis or edema. Gastrointestinal (GI) Abdomen without masses or tenderness.. No liver or spleen enlargement or tenderness.. Lymphatic No adneopathy. No adenopathy. No adenopathy. Musculoskeletal Adexa without tenderness or enlargement.. Digits and nails w/o clubbing, cyanosis, infection, petechiae, ischemia, or inflammatory conditions.Marland Kitchen Psychiatric Judgement and insight Intact.. No evidence of depression, anxiety, or agitation.Marland Kitchen Hagemann, HALSTON KINTZ ([id number]) General Notes: the wound is in the periumbilical area to the left of the midline and has some hyper granulation tissue. Though it does not look like a malignant lesion at some stage if does not heal appropriately the main need to biopsy this area. No sharp debridement was required today. Integumentary (Hair, Skin) No suspicious lesions. No crepitus or fluctuance. No peri-wound warmth or erythema. No masses.. Wound #2 status is Open. Original cause of wound was Gradually Appeared. The wound is located on the Abdomen - midline. The wound measures 1.7cm length x 2cm width x 0.1cm depth; 2.67cm^2 area and 0.267cm^3 volume. The wound is limited to skin breakdown. There is no tunneling or undermining noted. There is a large amount of serous drainage noted. The wound margin is flat and intact. There is large (67- 100%) red granulation within the wound bed. There is a  small (1-33%) amount of necrotic tissue within the wound bed including Adherent Slough. The periwound skin appearance did not exhibit: Callus, Crepitus, Excoriation, Induration, Rash, Scarring, Dry/Scaly, Maceration, Atrophie Blanche, Cyanosis, Ecchymosis, Hemosiderin Staining, Mottled, Pallor, Rubor, Erythema. Periwound temperature was noted as No Abnormality. Assessment Active Problems ICD-10 E11.622 - Type 2 diabetes mellitus with other skin ulcer S31.102D - Unspecified open wound of abdominal wall, epigastric region without penetration into peritoneal cavity, subsequent encounter Z99.2 - Dependence on renal dialysis Clinically the patient has a pressure injury and ulcer to the left of the midline in previous scar tissue which had healed appropriately when I saw him in January. there is no hernia formation this region. Though the tissue looks clean at some stage if appropriate healing does not occur he may need a biopsy, to rule out malignant transformation. at the present time I have recommended: 1. Hydrofera Blue with a bordered foam, to be changed daily after shower 2. Discussed offloading this area especially due to his baseline being around  the scar tissue 3. Adequate protein, vitamin A, vitamin C and zinc 4. good control of his diabetes mellitus 5. Regular visits to the wound center Plan Wound Cleansing: Gaspard, JENCARLOS NICOLSON. ([id number]) Wound #2 Abdomen - midline: Clean wound with Normal Saline. May Shower, gently pat wound dry prior to applying new dressing. Anesthetic: Wound #2 Abdomen - midline: Topical Lidocaine 4% cream applied to wound bed prior to debridement Primary Wound Dressing: Wound #2 Abdomen - midline: Hydrafera Blue Secondary Dressing: Wound #2 Abdomen - midline: Dry Gauze Boardered Foam Dressing Dressing Change Frequency: Wound #2 Abdomen - midline: Change dressing every other day. Follow-up Appointments: Wound #2 Abdomen - midline: Return Appointment  in 1 week. Additional Orders / Instructions: Wound #2 Abdomen - midline: Increase protein intake. Other: - Please add vitamin A, vitamin C and zinc supplements to your diet Clinically the patient has a pressure injury and ulcer to the left of the midline in previous scar tissue which had healed appropriately when I saw him in January. there is no hernia formation this region. Though the tissue looks clean at some stage if appropriate healing does not occur he may need a biopsy, to rule out malignant transformation. at the present time I have recommended: 1. Hydrofera Blue with a bordered foam, to be changed daily after shower 2. Discussed offloading this area especially due to his baseline being around the scar tissue 3. Adequate protein, vitamin A, vitamin C and zinc 4. good control of his diabetes mellitus 5. Regular visits to the wound center Electronic Signature(s) Signed: 01/28/2017 9:05:18 AM By: Christin Fudge MD, FACS Entered By: Christin Fudge on 01/28/2017 09:05:17 Dutton, [name] ([id number]) -------------------------------------------------------------------------------- ROS/PFSH Details Patient Name: [name]. Date of Service: 01/28/2017 8:00 AM Medical Record Number: [id number] Patient Account Number: [id number] Date of Birth/Sex: 05/24/1941 (76 y.o. Male) Treating RN: Montey Hora Primary Care Provider: Tedra Senegal Other Clinician: Referring Provider: Tedra Senegal Treating Provider/Extender: Frann Rider in Treatment: 0 Information Obtained From Patient Wound History Do you currently have one or more open woundso Yes How many open wounds do you currently haveo 1 Approximately how long have you had your woundso 2 weeks How have you been treating your wound(s) until nowo aquacel ag Has your wound(s) ever healed and then re-openedo No Have you had any lab work done in the past montho No Have you tested positive for an antibiotic resistant organism (MRSA,  VRE)o No Have you tested positive for osteomyelitis (bone infection)o No Have you had any tests for circulation on your legso No Hematologic/Lymphatic Medical History: Positive for: Anemia Cardiovascular Medical History: Positive for: Hypertension; Peripheral Venous Disease Past Medical History Notes: artificial valve, hyperlipidemia Gastrointestinal Medical History: Past Medical History Notes: scar tissue from multiple surgeries Endocrine Medical History: Positive for: Type II Diabetes Treated with: Insulin Musculoskeletal Medical History: Positive for: Gout Damaso, CARRIE SCHOONMAKER ([id number]) Neurologic Medical History: Positive for: Neuropathy Oncologic Medical History: Negative for: Received Chemotherapy; Received Radiation Immunizations Pneumococcal Vaccine: Received Pneumococcal Vaccination: Yes Immunization Notes: uo to date Family and Social History Former smoker - quit in 1996; Marital Status - Married; Alcohol Use: Never - quit in 1976; Drug Use: No History; Caffeine Use: Never; Financial Concerns: No; Food, Clothing or Shelter Needs: No; Support System Lacking: No; Transportation Concerns: No; Advanced Directives: No; Patient does not want information on Advanced Directives Physician Affirmation I have reviewed and agree with the above information. Electronic Signature(s) Signed: 01/28/2017 2:23:12 PM By: Montey Hora Signed: 01/28/2017 4:28:20 PM By: Con Memos  Roderick Pee MD, FACS Entered By: Christin Fudge on 01/28/2017 08:34:40 Brownstein, [name] ([id number]) -------------------------------------------------------------------------------- [place] Details Patient Name: [name]. Date of Service: 01/28/2017 Medical Record Number: [id number] Patient Account Number: [id number] Date of Birth/Sex: 06-10-1941 (76 y.o. Male) Treating RN: Montey Hora Primary Care Provider: Tedra Senegal Other Clinician: Referring Provider: Tedra Senegal Treating Provider/Extender:  Frann Rider in Treatment: 0 Diagnosis Coding ICD-10 Codes Code Description [id number] Type 2 diabetes mellitus with other skin ulcer Unspecified open wound of abdominal wall, epigastric region without penetration into S31.102D peritoneal cavity, subsequent encounter Z99.2 Dependence on renal dialysis Facility Procedures CPT4 Code: [id number] Description: 99213 - WOUND CARE VISIT-LEV 3 EST PT Modifier: Quantity: 1 Physician Procedures CPT4: Description Modifier Quantity Code [id number] - WC PHYS LEVEL 4 - EST PT 1 ICD-10 Description Diagnosis E11.622 Type 2 diabetes mellitus with other skin ulcer S31.102D Unspecified open wound of abdominal wall, epigastric region without  penetration into peritoneal cavity, subsequent encounter Z99.2 Dependence on renal dialysis Electronic Signature(s) Signed: 01/28/2017 9:05:36 AM By: Christin Fudge MD, FACS Entered By: Christin Fudge on 01/28/2017 09:05:36

## 2017-01-30 DIAGNOSIS — D509 Iron deficiency anemia, unspecified: Secondary | ICD-10-CM | POA: Diagnosis not present

## 2017-01-30 DIAGNOSIS — D631 Anemia in chronic kidney disease: Secondary | ICD-10-CM | POA: Diagnosis not present

## 2017-01-30 DIAGNOSIS — E1129 Type 2 diabetes mellitus with other diabetic kidney complication: Secondary | ICD-10-CM | POA: Diagnosis not present

## 2017-01-30 DIAGNOSIS — N186 End stage renal disease: Secondary | ICD-10-CM | POA: Diagnosis not present

## 2017-01-30 DIAGNOSIS — N2581 Secondary hyperparathyroidism of renal origin: Secondary | ICD-10-CM | POA: Diagnosis not present

## 2017-01-31 NOTE — Progress Notes (Signed)
Personally  participated in, made any corrections needed, and agree with history, physical, neuro exam,assessment and plan as stated above.    Johnmatthew Solorio, MD [facility] 

## 2017-02-01 DIAGNOSIS — E1129 Type 2 diabetes mellitus with other diabetic kidney complication: Secondary | ICD-10-CM | POA: Diagnosis not present

## 2017-02-01 DIAGNOSIS — D631 Anemia in chronic kidney disease: Secondary | ICD-10-CM | POA: Diagnosis not present

## 2017-02-01 DIAGNOSIS — N186 End stage renal disease: Secondary | ICD-10-CM | POA: Diagnosis not present

## 2017-02-01 DIAGNOSIS — N2581 Secondary hyperparathyroidism of renal origin: Secondary | ICD-10-CM | POA: Diagnosis not present

## 2017-02-01 DIAGNOSIS — D509 Iron deficiency anemia, unspecified: Secondary | ICD-10-CM | POA: Diagnosis not present

## 2017-02-04 DIAGNOSIS — D509 Iron deficiency anemia, unspecified: Secondary | ICD-10-CM | POA: Diagnosis not present

## 2017-02-04 DIAGNOSIS — Z992 Dependence on renal dialysis: Secondary | ICD-10-CM | POA: Diagnosis not present

## 2017-02-04 DIAGNOSIS — N186 End stage renal disease: Secondary | ICD-10-CM | POA: Diagnosis not present

## 2017-02-05 LAB — HM DIABETES EYE EXAM

## 2017-02-06 DIAGNOSIS — H43813 Vitreous degeneration, bilateral: Secondary | ICD-10-CM | POA: Diagnosis not present

## 2017-02-06 DIAGNOSIS — H52203 Unspecified astigmatism, bilateral: Secondary | ICD-10-CM | POA: Diagnosis not present

## 2017-02-06 DIAGNOSIS — N2581 Secondary hyperparathyroidism of renal origin: Secondary | ICD-10-CM | POA: Diagnosis not present

## 2017-02-06 DIAGNOSIS — D631 Anemia in chronic kidney disease: Secondary | ICD-10-CM | POA: Diagnosis not present

## 2017-02-06 DIAGNOSIS — D509 Iron deficiency anemia, unspecified: Secondary | ICD-10-CM | POA: Diagnosis not present

## 2017-02-06 DIAGNOSIS — N186 End stage renal disease: Secondary | ICD-10-CM | POA: Diagnosis not present

## 2017-02-06 DIAGNOSIS — E113293 Type 2 diabetes mellitus with mild nonproliferative diabetic retinopathy without macular edema, bilateral: Secondary | ICD-10-CM | POA: Diagnosis not present

## 2017-02-06 DIAGNOSIS — E1129 Type 2 diabetes mellitus with other diabetic kidney complication: Secondary | ICD-10-CM | POA: Diagnosis not present

## 2017-02-06 DIAGNOSIS — Z961 Presence of intraocular lens: Secondary | ICD-10-CM | POA: Diagnosis not present

## 2017-02-07 ENCOUNTER — Encounter: Payer: Medicare Other | Admitting: Surgery

## 2017-02-07 DIAGNOSIS — E1122 Type 2 diabetes mellitus with diabetic chronic kidney disease: Secondary | ICD-10-CM | POA: Diagnosis not present

## 2017-02-07 DIAGNOSIS — L98491 Non-pressure chronic ulcer of skin of other sites limited to breakdown of skin: Secondary | ICD-10-CM | POA: Diagnosis not present

## 2017-02-07 DIAGNOSIS — I120 Hypertensive chronic kidney disease with stage 5 chronic kidney disease or end stage renal disease: Secondary | ICD-10-CM | POA: Diagnosis not present

## 2017-02-07 DIAGNOSIS — E11622 Type 2 diabetes mellitus with other skin ulcer: Secondary | ICD-10-CM | POA: Diagnosis not present

## 2017-02-07 DIAGNOSIS — N186 End stage renal disease: Secondary | ICD-10-CM | POA: Diagnosis not present

## 2017-02-07 DIAGNOSIS — S31102D Unspecified open wound of abdominal wall, epigastric region without penetration into peritoneal cavity, subsequent encounter: Secondary | ICD-10-CM | POA: Diagnosis not present

## 2017-02-07 DIAGNOSIS — Z992 Dependence on renal dialysis: Secondary | ICD-10-CM | POA: Diagnosis not present

## 2017-02-08 DIAGNOSIS — D631 Anemia in chronic kidney disease: Secondary | ICD-10-CM | POA: Diagnosis not present

## 2017-02-08 DIAGNOSIS — D509 Iron deficiency anemia, unspecified: Secondary | ICD-10-CM | POA: Diagnosis not present

## 2017-02-08 DIAGNOSIS — E1129 Type 2 diabetes mellitus with other diabetic kidney complication: Secondary | ICD-10-CM | POA: Diagnosis not present

## 2017-02-08 DIAGNOSIS — N2581 Secondary hyperparathyroidism of renal origin: Secondary | ICD-10-CM | POA: Diagnosis not present

## 2017-02-08 DIAGNOSIS — N186 End stage renal disease: Secondary | ICD-10-CM | POA: Diagnosis not present

## 2017-02-09 NOTE — Progress Notes (Signed)
KEYNAN, HEFFERN ([id number]) Visit Report for 02/07/2017 Chief Complaint Document Details Patient Name: [name], [name]. Date of Service: 02/07/2017 9:15 AM Medical Record Number: [id number] Patient Account Number: [id number] Date of Birth/Sex: January 18, 1941 (76 y.o. Male) Treating RN: Montey Hora Primary Care Provider: Tedra Senegal Other Clinician: Referring Provider: Tedra Senegal Treating Provider/Extender: Frann Rider in Treatment: 1 Information Obtained from: Patient Chief Complaint Patients presents for treatment of an open diabetic ulcer to the periumbilical area which he has had for 2 weeks. he had a similar problem in January of this year and the ulcer healed with offloading and local care Electronic Signature(s) Signed: 02/07/2017 9:36:37 AM By: Christin Fudge MD, FACS Entered By: Christin Fudge on 02/07/2017 09:36:37 Crockett, [name] ([id number]) -------------------------------------------------------------------------------- HPI Details Patient Name: [name]. Date of Service: 02/07/2017 9:15 AM Medical Record Number: [id number] Patient Account Number: [id number] Date of Birth/Sex: 1941/04/21 (76 y.o. Male) Treating RN: Montey Hora Primary Care Provider: Tedra Senegal Other Clinician: Referring Provider: Tedra Senegal Treating Provider/Extender: Frann Rider in Treatment: 1 History of Present Illness Location: periumbilical area. is on the left of the midline Quality: Patient reports No Pain. Severity: Patient states wound (s) are getting better. Duration: Patient has had the wound for < 2 weeks prior to presenting for treatment Context: The wound appeared gradually over time Modifying Factors: Patient is currently on renal dialysis and receives treatments 3 times weekly Associated Signs and Symptoms: Patient reports having:then local care with hydrogen peroxide and Bactroban ointment HPI Description: 76 year old gentleman seen earlier this year in  January for a abdominal wound is now back with the same problem which has recurred for about 2 weeks and he has been trying to apply some local antibiotic ointment and a Band-Aid there. Of note he has significant pressure in this area due to his beltline and may have put on some weight which results in his trousers being very tight around the waist. The else has changed in his HandP and his diabetes is pretty well controlled. most recent hemoglobin A1c on 12/06/2016 was 7.8%. he was reviewed by his endocrinologist Dr. Dwyane Dee who made appropriate modifications to his treatment plan. he was recently seen by neurology for mild cognitive impairment that was found to be normal for his age ====== Old notes The 76 year old gentleman who has a past medical history of diabetes mellitus, end-stage renal disease on dialysis, hypertension, secondary hyperparathyroidism, peripheral vascular disease and history of previous arterial bifemoral bypass graft in 1992 also has a history of gout, hyperlipidemia, hypothyroidism. last hemoglobin A1c was 7.5%. status post AV fistula placement, breast surgery for left granulomatous mastitis, triple a repair in 1992, small bowel obstruction, upper arm angiograms for AV fistula on the left side. was noted to have a superficial skin wound in the periumbilical area since late December 2017. He was asked to clean it with hydrogen peroxide and apply Bactroban. He was seen by vascular surgery in October 2017 where his right ABI was 0.56 left ABI was 0.87 and right TBI was 0.69 and left TBI was 0.70 ===== Electronic Signature(s) Signed: 02/07/2017 9:36:43 AM By: Christin Fudge MD, FACS Entered By: Christin Fudge on 02/07/2017 09:36:42 [name], [name] ([id number]) -------------------------------------------------------------------------------- Physical Exam Details Patient Name: [name]. Date of Service: 02/07/2017 9:15 AM Medical Record Number: [id number] Patient  Account Number: [id number] Date of Birth/Sex: [birth year] (76 y.o. Male) Treating RN: Montey Hora Primary Care Provider: Tedra Senegal Other Clinician: Referring Provider: Tedra Senegal Treating Provider/Extender: Frann Rider in  Treatment: 1 Constitutional . Pulse regular. Respirations normal and unlabored. Afebrile. . Eyes Nonicteric. Reactive to light. Ears, Nose, Mouth, and Throat Lips, teeth, and gums WNL.Marland Kitchen Moist mucosa without lesions. Neck supple and nontender. No palpable supraclavicular or cervical adenopathy. Normal sized without goiter. Respiratory WNL. No retractions.. Breath sounds WNL, No rubs, rales, rhonchi, or wheeze.. Cardiovascular Heart rhythm and rate regular, no murmur or gallop.. Pedal Pulses WNL. No clubbing, cyanosis or edema. Chest Breasts symmetical and no nipple discharge.. Breast tissue WNL, no masses, lumps, or tenderness.. Lymphatic No adneopathy. No adenopathy. No adenopathy. Musculoskeletal Adexa without tenderness or enlargement.. Digits and nails w/o clubbing, cyanosis, infection, petechiae, ischemia, or inflammatory conditions.. Integumentary (Hair, Skin) No suspicious lesions. No crepitus or fluctuance. No peri-wound warmth or erythema. No masses.Marland Kitchen Psychiatric Judgement and insight Intact.. No evidence of depression, anxiety, or agitation.. Notes the wound has had healthy epithelialization on the lower half and it bleeds briskly at the superior part of it and I'll use some silver nitrate stick to control the bleeding. Electronic Signature(s) Signed: 02/07/2017 9:37:17 AM By: Christin Fudge MD, FACS Entered By: Christin Fudge on 02/07/2017 09:37:16 [name], [name] ([id number]) -------------------------------------------------------------------------------- Physician Orders Details Patient Name: [name]. Date of Service: 02/07/2017 9:15 AM Medical Record Number: [id number] Patient Account Number: [id number] Date of Birth/Sex:  1941-06-12 (76 y.o. Male) Treating RN: Montey Hora Primary Care Provider: Tedra Senegal Other Clinician: Referring Provider: Tedra Senegal Treating Provider/Extender: Frann Rider in Treatment: 1 Verbal / Phone Orders: No Diagnosis Coding Wound Cleansing Wound #2 Abdomen - midline o Clean wound with Normal Saline. o May Shower, gently pat wound dry prior to applying new dressing. Anesthetic Wound #2 Abdomen - midline o Topical Lidocaine 4% cream applied to wound bed prior to debridement Primary Wound Dressing Wound #2 Abdomen - midline o Hydrafera Blue Secondary Dressing Wound #2 Abdomen - midline o Dry Gauze o Boardered Foam Dressing Dressing Change Frequency Wound #2 Abdomen - midline o Change dressing every other day. Follow-up Appointments Wound #2 Abdomen - midline o Return Appointment in 1 week. Additional Orders / Instructions Wound #2 Abdomen - midline o Increase protein intake. o Other: - Please add vitamin A, vitamin C and zinc supplements to your diet Electronic Signature(s) Signed: 02/07/2017 4:13:25 PM By: Christin Fudge MD, FACS Signed: 02/07/2017 4:43:00 PM By: Liana Gerold ([id number]) Entered By: Montey Hora on 02/07/2017 09:26:12 [name], [name] ([id number]) -------------------------------------------------------------------------------- Problem List Details Patient Name: [name]. Date of Service: 02/07/2017 9:15 AM Medical Record Number: [id number] Patient Account Number: [id number] Date of Birth/Sex: [birth year] (76 y.o. Male) Treating RN: Montey Hora Primary Care Provider: Tedra Senegal Other Clinician: Referring Provider: Tedra Senegal Treating Provider/Extender: Frann Rider in Treatment: 1 Active Problems ICD-10 Encounter Code Description Active Date Diagnosis E11.622 Type 2 diabetes mellitus with other skin ulcer 01/28/2017 Yes S31.102D Unspecified open wound of abdominal wall,  epigastric 01/28/2017 Yes region without penetration into peritoneal cavity, subsequent encounter Z99.2 Dependence on renal dialysis 01/28/2017 Yes Inactive Problems Resolved Problems Electronic Signature(s) Signed: 02/07/2017 9:36:26 AM By: Christin Fudge MD, FACS Entered By: Christin Fudge on 02/07/2017 09:36:25 [name], [name] ([id number]) -------------------------------------------------------------------------------- Progress Note Details Patient Name: [name]. Date of Service: 02/07/2017 9:15 AM Medical Record Number: [id number] Patient Account Number: [id number] Date of Birth/Sex: 1941-07-15 (76 y.o. Male) Treating RN: Montey Hora Primary Care Provider: Tedra Senegal Other Clinician: Referring Provider: Tedra Senegal Treating Provider/Extender: Frann Rider in Treatment: 1 Subjective Chief Complaint Information obtained from  Patient Patients presents for treatment of an open diabetic ulcer to the periumbilical area which he has had for 2 weeks. he had a similar problem in January of this year and the ulcer healed with offloading and local care History of Present Illness (HPI) The following HPI elements were documented for the patient's wound: Location: periumbilical area. is on the left of the midline Quality: Patient reports No Pain. Severity: Patient states wound (s) are getting better. Duration: Patient has had the wound for < 2 weeks prior to presenting for treatment Context: The wound appeared gradually over time Modifying Factors: Patient is currently on renal dialysis and receives treatments 3 times weekly Associated Signs and Symptoms: Patient reports having:then local care with hydrogen peroxide and Bactroban ointment 76 year old gentleman seen earlier this year in January for a abdominal wound is now back with the same problem which has recurred for about 2 weeks and he has been trying to apply some local antibiotic ointment and a Band-Aid there. Of note  he has significant pressure in this area due to his beltline and may have put on some weight which results in his trousers being very tight around the waist. The else has changed in his HandP and his diabetes is pretty well controlled. most recent hemoglobin A1c on 12/06/2016 was 7.8%. he was reviewed by his endocrinologist Dr. Dwyane Dee who made appropriate modifications to his treatment plan. he was recently seen by neurology for mild cognitive impairment that was found to be normal for his age ====== Old notes The 76 year old gentleman who has a past medical history of diabetes mellitus, end-stage renal disease on dialysis, hypertension, secondary hyperparathyroidism, peripheral vascular disease and history of previous arterial bifemoral bypass graft in 1992 also has a history of gout, hyperlipidemia, hypothyroidism. last hemoglobin A1c was 7.5%. status post AV fistula placement, breast surgery for left granulomatous mastitis, triple a repair in 1992, small bowel obstruction, upper arm angiograms for AV fistula on the left side. was noted to have a superficial skin wound in the periumbilical area since late December 2017. He was asked to clean it with hydrogen peroxide and apply Bactroban. He was seen by vascular surgery in October 2017 where his right ABI was 0.56 left ABI was 0.87 and right [name], [name]. ([id number]) TBI was 0.69 and left TBI was 0.70 ===== Objective Constitutional Pulse regular. Respirations normal and unlabored. Afebrile. Vitals Time Taken: 9:16 AM, Height: 72 in, Weight: 201 lbs, BMI: 27.3, Temperature: 98.4 F, Pulse: 68 bpm, Respiratory Rate: 18 breaths/min, Blood Pressure: 135/53 mmHg. Eyes Nonicteric. Reactive to light. Ears, Nose, Mouth, and Throat Lips, teeth, and gums WNL.Marland Kitchen Moist mucosa without lesions. Neck supple and nontender. No palpable supraclavicular or cervical adenopathy. Normal sized without goiter. Respiratory WNL. No retractions.. Breath sounds  WNL, No rubs, rales, rhonchi, or wheeze.. Cardiovascular Heart rhythm and rate regular, no murmur or gallop.. Pedal Pulses WNL. No clubbing, cyanosis or edema. Chest Breasts symmetical and no nipple discharge.. Breast tissue WNL, no masses, lumps, or tenderness.. Lymphatic No adneopathy. No adenopathy. No adenopathy. Musculoskeletal Adexa without tenderness or enlargement.. Digits and nails w/o clubbing, cyanosis, infection, petechiae, ischemia, or inflammatory conditions.Marland Kitchen Psychiatric Judgement and insight Intact.. No evidence of depression, anxiety, or agitation.. General Notes: the wound has had healthy epithelialization on the lower half and it bleeds briskly at the superior part of it and I'll use some silver nitrate stick to control the bleeding. Integumentary (Hair, Skin) [name], [name]. ([id number]) No suspicious lesions. No crepitus or fluctuance. No  peri-wound warmth or erythema. No masses.. Wound #2 status is Open. Original cause of wound was Gradually Appeared. The wound is located on the Abdomen - midline. The wound measures 1.3cm length x 1.1cm width x 0.1cm depth; 1.123cm^2 area and 0.112cm^3 volume. The wound is limited to skin breakdown. There is no tunneling or undermining noted. There is a large amount of serous drainage noted. The wound margin is flat and intact. There is large (67- 100%) red granulation within the wound bed. There is a small (1-33%) amount of necrotic tissue within the wound bed including Adherent Slough. The periwound skin appearance did not exhibit: Callus, Crepitus, Excoriation, Induration, Rash, Scarring, Dry/Scaly, Maceration, Atrophie Blanche, Cyanosis, Ecchymosis, Hemosiderin Staining, Mottled, Pallor, Rubor, Erythema. Periwound temperature was noted as No Abnormality. Assessment Active Problems ICD-10 E11.622 - Type 2 diabetes mellitus with other skin ulcer S31.102D - Unspecified open wound of abdominal wall, epigastric region without  penetration into peritoneal cavity, subsequent encounter Z99.2 - Dependence on renal dialysis Plan Wound Cleansing: Wound #2 Abdomen - midline: Clean wound with Normal Saline. May Shower, gently pat wound dry prior to applying new dressing. Anesthetic: Wound #2 Abdomen - midline: Topical Lidocaine 4% cream applied to wound bed prior to debridement Primary Wound Dressing: Wound #2 Abdomen - midline: Hydrafera Blue Secondary Dressing: Wound #2 Abdomen - midline: Dry Gauze Boardered Foam Dressing Dressing Change Frequency: Wound #2 Abdomen - midline: Change dressing every other day. Follow-up Appointments: [name], [name] ([id number]) Wound #2 Abdomen - midline: Return Appointment in 1 week. Additional Orders / Instructions: Wound #2 Abdomen - midline: Increase protein intake. Other: - Please add vitamin A, vitamin C and zinc supplements to your diet He has made good recovery and improvement over the last week and I have recommended: 1. Hydrofera Blue with a bordered foam, to be changed daily after shower 2. Discussed offloading this area especially due to his baseline being around the scar tissue 3. Regular visits to the wound center Electronic Signature(s) Signed: 02/07/2017 9:38:13 AM By: Christin Fudge MD, FACS Entered By: Christin Fudge on 02/07/2017 09:38:13 [name], [name] ([id number]) -------------------------------------------------------------------------------- SuperBill Details Patient Name: [name]. Date of Service: 02/07/2017 Medical Record Number: [id number] Patient Account Number: [id number] Date of Birth/Sex: 05-12-1941 (76 y.o. Male) Treating RN: Montey Hora Primary Care Provider: Tedra Senegal Other Clinician: Referring Provider: Tedra Senegal Treating Provider/Extender: Frann Rider in Treatment: 1 Diagnosis Coding ICD-10 Codes Code Description [phone] Type 2 diabetes mellitus with other skin ulcer Unspecified open wound of abdominal  wall, epigastric region without penetration into S31.102D peritoneal cavity, subsequent encounter Z99.2 Dependence on renal dialysis Facility Procedures CPT4 Code: [id number] Description: [phone] - WOUND CARE VISIT-LEV 2 EST PT Modifier: Quantity: 1 Physician Procedures CPT4: Description Modifier Quantity Code [id number] - WC PHYS LEVEL 3 - EST PT 1 ICD-10 Description Diagnosis E11.622 Type 2 diabetes mellitus with other skin ulcer S31.102D Unspecified open wound of abdominal wall, epigastric region without  penetration into peritoneal cavity, subsequent encounter Z99.2 Dependence on renal dialysis Electronic Signature(s) Signed: 02/08/2017 8:41:30 AM By: Montey Hora Signed: 02/08/2017 4:04:46 PM By: Christin Fudge MD, FACS Previous Signature: 02/07/2017 9:38:27 AM Version By: Christin Fudge MD, FACS Entered By: Montey Hora on 02/08/2017 08:41:30

## 2017-02-09 NOTE — Progress Notes (Signed)
TESEAN, STUMP ([id number]) Visit Report for 02/07/2017 Arrival Information Details Patient Name: CARLOSDANIEL, GROB. Date of Service: 02/07/2017 9:15 AM Medical Record Number: [id number] Patient Account Number: [id number] Date of Birth/Sex: [birth year] (76 y.o. Male) Treating RN: Montey Hora Primary Care Kaitlan Bin: Tedra Senegal Other Clinician: Referring Koki Buxton: Tedra Senegal Treating Guadalupe Nickless/Extender: Frann Rider in Treatment: 1 Visit Information History Since Last Visit Added or deleted any medications: No Patient Arrived: Ambulatory Any new allergies or adverse reactions: No Arrival Time: 09:16 Had a fall or experienced change in No Accompanied By: self activities of daily living that may affect Transfer Assistance: None risk of falls: Patient Identification Verified: Yes Signs or symptoms of abuse/neglect since last No Secondary Verification Process Yes visito Completed: Hospitalized since last visit: No Patient Has Alerts: Yes Has Dressing in Place as Prescribed: Yes Patient Alerts: Patient on Blood Pain Present Now: No Thinner DMII aspirin 650mg  Electronic Signature(s) Signed: 02/07/2017 4:43:00 PM By: Montey Hora Entered By: Montey Hora on 02/07/2017 09:16:42 Witters, [name] ([id number]) -------------------------------------------------------------------------------- Clinic Level of Care Assessment Details Patient Name: [name]. Date of Service: 02/07/2017 9:15 AM Medical Record Number: [id number] Patient Account Number: [id number] Date of Birth/Sex: [birth year] (76 y.o. Male) Treating RN: Montey Hora Primary Care Kairos Panetta: Tedra Senegal Other Clinician: Referring Jewelle Whitner: Tedra Senegal Treating Lamae Fosco/Extender: Frann Rider in Treatment: 1 Clinic Level of Care Assessment Items TOOL 4 Quantity Score []  - Use when only an EandM is performed on FOLLOW-UP visit 0 ASSESSMENTS - Nursing Assessment / Reassessment X - Reassessment of  Co-morbidities (includes updates in patient status) 1 10 X - Reassessment of Adherence to Treatment Plan 1 5 ASSESSMENTS - Wound and Skin Assessment / Reassessment X - Simple Wound Assessment / Reassessment - one wound 1 5 []  - Complex Wound Assessment / Reassessment - multiple wounds 0 []  - Dermatologic / Skin Assessment (not related to wound area) 0 ASSESSMENTS - Focused Assessment []  - Circumferential Edema Measurements - multi extremities 0 []  - Nutritional Assessment / Counseling / Intervention 0 []  - Lower Extremity Assessment (monofilament, tuning fork, pulses) 0 []  - Peripheral Arterial Disease Assessment (using hand held doppler) 0 ASSESSMENTS - Ostomy and/or Continence Assessment and Care []  - Incontinence Assessment and Management 0 []  - Ostomy Care Assessment and Management (repouching, etc.) 0 PROCESS - Coordination of Care X - Simple Patient / Family Education for ongoing care 1 15 []  - Complex (extensive) Patient / Family Education for ongoing care 0 []  - Staff obtains Programmer, systems, Records, Test Results / Process Orders 0 []  - Staff telephones HHA, Nursing Homes / Clarify orders / etc 0 []  - Routine Transfer to another Facility (non-emergent condition) 0 Mark, [name] ([id number]) []  - Routine Hospital Admission (non-emergent condition) 0 []  - New Admissions / Biomedical engineer / Ordering NPWT, Apligraf, etc. 0 []  - Emergency Hospital Admission (emergent condition) 0 X - Simple Discharge Coordination 1 10 []  - Complex (extensive) Discharge Coordination 0 PROCESS - Special Needs []  - Pediatric / Minor Patient Management 0 []  - Isolation Patient Management 0 []  - Hearing / Language / Visual special needs 0 []  - Assessment of Community assistance (transportation, D/C planning, etc.) 0 []  - Additional assistance / Altered mentation 0 []  - Support Surface(s) Assessment (bed, cushion, seat, etc.) 0 INTERVENTIONS - Wound Cleansing / Measurement X - Simple Wound  Cleansing - one wound 1 5 []  - Complex Wound Cleansing - multiple wounds 0 X - Wound Imaging (photographs - any number of wounds) 1  5 []  - Wound Tracing (instead of photographs) 0 X - Simple Wound Measurement - one wound 1 5 []  - Complex Wound Measurement - multiple wounds 0 INTERVENTIONS - Wound Dressings X - Small Wound Dressing one or multiple wounds 1 10 []  - Medium Wound Dressing one or multiple wounds 0 []  - Large Wound Dressing one or multiple wounds 0 []  - Application of Medications - topical 0 []  - Application of Medications - injection 0 INTERVENTIONS - Miscellaneous []  - External ear exam 0 Thayne, [name] ([id number]) []  - Specimen Collection (cultures, biopsies, blood, body fluids, etc.) 0 []  - Specimen(s) / Culture(s) sent or taken to Lab for analysis 0 []  - Patient Transfer (multiple staff / Harrel Lemon Lift / Similar devices) 0 []  - Simple Staple / Suture removal (25 or less) 0 []  - Complex Staple / Suture removal (26 or more) 0 []  - Hypo / Hyperglycemic Management (close monitor of Blood Glucose) 0 []  - Ankle / Brachial Index (ABI) - do not check if billed separately 0 X - Vital Signs 1 5 Has the patient been seen at the hospital within the last three years: Yes Total Score: 75 Level Of Care: New/Established - Level 2 Electronic Signature(s) Signed: 02/08/2017 4:27:27 PM By: Montey Hora Entered By: Montey Hora on 02/08/2017 08:41:21 Toral, [name] ([id number]) -------------------------------------------------------------------------------- Encounter Discharge Information Details Patient Name: [name]. Date of Service: 02/07/2017 9:15 AM Medical Record Number: [id number] Patient Account Number: [id number] Date of Birth/Sex: 1941-04-19 (76 y.o. Male) Treating RN: Montey Hora Primary Care Champayne Kocian: Tedra Senegal Other Clinician: Referring Tashi Andujo: Tedra Senegal Treating Arielys Wandersee/Extender: Frann Rider in Treatment: 1 Encounter Discharge  Information Items Schedule Follow-up Appointment: No Medication Reconciliation completed No and provided to Patient/Care Elisheva Fallas: Provided on Clinical Summary of Care: 02/07/2017 Form Type Recipient Paper Patient WT Electronic Signature(s) Signed: 02/07/2017 9:30:22 AM By: Ruthine Dose Entered By: Ruthine Dose on 02/07/2017 09:30:22 Awtrey, [name] ([id number]) -------------------------------------------------------------------------------- Multi Wound Chart Details Patient Name: [name]. Date of Service: 02/07/2017 9:15 AM Medical Record Number: [id number] Patient Account Number: [id number] Date of Birth/Sex: Feb 14, 1941 (76 y.o. 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] [N/A:N/A] Comorbid History: [2:Anemia, Hypertension, Peripheral Venous Disease, Type II Diabetes, Gout, Neuropathy] [N/A:N/A] Date Acquired: [2:01/14/2017] [N/A:N/A] Weeks of Treatment: [2:1] [N/A:N/A] Wound Status: [2:Open] [N/A:N/A] Measurements L x W x D 1.3x1.1x0.1 [N/A:N/A] (cm) Area (cm) : [2:1.123] [N/A:N/A] Volume (cm) : [2:0.112] [N/A:N/A] % Reduction in Area: [2:57.90%] [N/A:N/A] % Reduction in Volume: 58.10% [N/A:N/A] Classification: [2:Category/Stage II] [N/A:N/A] Exudate Amount: [2:Large] [N/A:N/A] Exudate Type: [2:Serous] [N/A:N/A] Exudate Color: [2:amber] [N/A:N/A] Wound Margin: [2:Flat and Intact] [N/A:N/A] Granulation Amount: [2:Large (67-100%)] [N/A:N/A] Granulation Quality: [2:Red]  [N/A:N/A] Necrotic Amount: [2:Small (1-33%)] [N/A:N/A] Exposed Structures: [2:Fascia: No Fat Layer (Subcutaneous Tissue) Exposed: No Tendon: No Muscle: No] [N/A:N/A] Joint: No Bone: No Limited to Skin Breakdown Epithelialization: None N/A N/A Periwound Skin Texture: Excoriation: No N/A N/A Induration: No Callus: No Crepitus: No Rash: No Scarring: No Periwound Skin Maceration: No N/A N/A Moisture: Dry/Scaly: No Periwound Skin Color: Atrophie Blanche: No N/A N/A Cyanosis: No Ecchymosis: No Erythema: No Hemosiderin Staining: No Mottled: No Pallor: No Rubor: No Temperature:  No Abnormality N/A N/A Tenderness on No N/A N/A Palpation: Wound Preparation: Ulcer Cleansing: N/A N/A Rinsed/Irrigated with Saline Topical Anesthetic Applied: Other: lidocaine 4% Treatment Notes Electronic Signature(s) Signed: 02/07/2017 9:36:30 AM By: Christin Fudge MD, FACS Entered By: Christin Fudge on 02/07/2017 09:36:30 Perdue, [name] ([id number]) -------------------------------------------------------------------------------- Ginger Blue Details Patient Name: [name]. Date of Service: 02/07/2017 9:15 AM Medical Record Number: [id number] Patient Account Number: [id number] Date of Birth/Sex: [birth year] (76 y.o. Male) Treating RN: Montey Hora Primary Care Jolan Mealor: Tedra Senegal Other Clinician: Referring Nour Rodrigues: Tedra [place]egal Treating Donica Derouin/Extender: Frann Rider in Treatment: 1 Active Inactive ` Orientation to the Wound Care Program Nursing Diagnoses: Knowledge deficit related to the wound healing center program Goals: Patient/caregiver will verbalize understanding of the [facility] Program Date Initiated: 01/28/2017 Target Resolution Date: 04/19/2017 Goal Status: Active Interventions: Provide education on orientation to the wound center Notes: ` Wound/Skin Impairment Nursing Diagnoses: Impaired tissue  integrity Goals: Patient/caregiver will verbalize understanding of skin care regimen Date Initiated: 01/28/2017 Target Resolution Date: 04/19/2017 Goal Status: Active Ulcer/skin breakdown will have a volume reduction of 30% by week 4 Date Initiated: 01/28/2017 Target Resolution Date: 04/19/2017 Goal Status: Active Ulcer/skin breakdown will have a volume reduction of 50% by week 8 Date Initiated: 01/28/2017 Target Resolution Date: 04/19/2017 Goal Status: Active Ulcer/skin breakdown will have a volume reduction of 80% by week 12 Date Initiated: 01/28/2017 Target Resolution Date: 04/19/2017 Goal Status: Active Ulcer/skin breakdown will heal within 14 weeks Date Initiated: 01/28/2017 Target Resolution Date: 04/19/2017 TATEZackerie, Sara ([id number]) Goal Status: Active Interventions: Assess patient/caregiver ability to obtain necessary supplies Assess patient/caregiver ability to perform ulcer/skin care regimen upon admission and as needed Assess ulceration(s) every visit Notes: Electronic Signature(s) Signed: 02/07/2017 4:43:00 PM By: Montey Hora Entered By: Montey Hora on 02/07/2017 09:22:57 Gaber, [name] ([id number]) -------------------------------------------------------------------------------- Pain Assessment Details Patient Name: [name]. Date of Service: 02/07/2017 9:15 AM Medical Record Number: [id number] Patient Account Number: [id number] Date of Birth/Sex: Dec 29, 1940 (76 y.o. Male) Treating RN: Montey Hora Primary Care Lounette Sloan: Tedra Senegal Other Clinician: Referring Gaynelle Pastrana: Tedra Senegal Treating Sherrina Zaugg/Extender: Frann Rider in Treatment: 1 Active Problems Location of Pain Severity and Description of Pain Patient Has Paino No Site Locations Pain Management and Medication Current Pain Management: Notes Topical or injectable lidocaine is offered to patient for acute pain when surgical debridement is performed. If needed, Patient is instructed to use  over the counter pain medication for the following 24-48 hours after debridement. Wound care MDs do not prescribed pain medications. Patient has chronic pain or uncontrolled pain. Patient has been instructed to make an appointment with their Primary Care Physician for pain management. Electronic Signature(s) Signed: 02/07/2017 4:43:00 PM By: Montey Hora Entered By: Montey Hora on 02/07/2017 09:16:50 Mahmood, [name] ([id number]) -------------------------------------------------------------------------------- Wound Assessment Details Patient Name: [name]. Date of Service: 02/07/2017 9:15 AM Medical Record Number: [id number] Patient Account Number: [id number] Date of Birth/Sex: Nov 02, 1940 (76 y.o. Male) Treating RN: Montey Hora Primary Care Deysha Cartier: Tedra Senegal Other Clinician: Referring Sorah Falkenstein: Tedra Senegal Treating Keari Miu/Extender: Frann Rider in Treatment: 1 Wound Status Wound Number: 2 Primary Pressure Ulcer Etiology: Wound Location: Abdomen - midline Wound Open Wounding Event: Gradually Appeared Status: Date Acquired: 01/14/2017 Comorbid Anemia, Hypertension, Peripheral Weeks Of Treatment: 1 History: Venous Disease, Type II Diabetes, Clustered Wound: No Gout, Neuropathy Photos Photo Uploaded By: Montey Hora on 02/07/2017 09:40:33 Wound Measurements Length: (cm) 1.3 Width: (cm) 1.1 Depth: (cm) 0.1 Area: (cm) 1.123 Volume: (cm)  0.112 % Reduction in Area: 57.9% % Reduction in Volume: 58.1% Epithelialization: None Tunneling: No Undermining: No Wound Description Classification: Category/Stage II Wound Margin: Flat and Intact Exudate Amount: Large Exudate Type: Serous Exudate Color: amber Foul Odor After Cleansing: No Slough/Fibrino Yes Wound Bed Granulation Amount: Large (67-100%) Exposed Structure Granulation Quality: Red Fascia Exposed: No Necrotic Amount: Small (1-33%) Fat Layer (Subcutaneous Tissue) Exposed: No Necrotic  Quality: Adherent Slough Tendon Exposed: No Stelzer, [name] ([id number]) Muscle Exposed: No Joint Exposed: No Bone Exposed: No Limited to Skin Breakdown Periwound Skin Texture Texture Color No Abnormalities Noted: No No Abnormalities Noted: No Callus: No Atrophie Blanche: No Crepitus: No Cyanosis: No Excoriation: No Ecchymosis: No Induration: No Erythema: No Rash: No Hemosiderin Staining: No Scarring: No Mottled: No Pallor: No Moisture Rubor: No No Abnormalities Noted: No Dry / Scaly: No Temperature / Pain Maceration: No Temperature: No Abnormality Wound Preparation Ulcer Cleansing: Rinsed/Irrigated with Saline Topical Anesthetic Applied: Other: lidocaine 4%, Electronic Signature(s) Signed: 02/07/2017 4:43:00 PM By: Montey Hora Entered By: Montey Hora on 02/07/2017 09:22:48 Andon, [name] ([id number]) -------------------------------------------------------------------------------- Vitals Details Patient Name: [name]. Date of Service: 02/07/2017 9:15 AM Medical Record Number: [id number] Patient Account Number: [id number] Date of Birth/Sex: 1940/09/29 (76 y.o. Male) Treating RN: Montey Hora Primary Care Deziree Mokry: Tedra Senegal Other Clinician: Referring Breck [place]: Tedra Senegal Treating Dixon Luczak/Extender: Frann Rider in Treatment: 1 Vital Signs Time Taken: 09:16 Temperature (F): 98.4 Height (in): 72 Pulse (bpm): 68 Weight (lbs): 201 Respiratory Rate (breaths/min): 18 Body Mass Index (BMI): 27.3 Blood Pressure (mmHg): 135/53 Reference Range: 80 - 120 mg / dl Electronic Signature(s) Signed: 02/07/2017 4:43:00 PM By: Montey Hora Entered By: Montey Hora on 02/07/2017 09:19:44

## 2017-02-11 DIAGNOSIS — N2581 Secondary hyperparathyroidism of renal origin: Secondary | ICD-10-CM | POA: Diagnosis not present

## 2017-02-11 DIAGNOSIS — E1129 Type 2 diabetes mellitus with other diabetic kidney complication: Secondary | ICD-10-CM | POA: Diagnosis not present

## 2017-02-11 DIAGNOSIS — D631 Anemia in chronic kidney disease: Secondary | ICD-10-CM | POA: Diagnosis not present

## 2017-02-11 DIAGNOSIS — N186 End stage renal disease: Secondary | ICD-10-CM | POA: Diagnosis not present

## 2017-02-11 DIAGNOSIS — D509 Iron deficiency anemia, unspecified: Secondary | ICD-10-CM | POA: Diagnosis not present

## 2017-02-12 ENCOUNTER — Encounter: Payer: Self-pay | Admitting: Endocrinology

## 2017-02-13 DIAGNOSIS — N2581 Secondary hyperparathyroidism of renal origin: Secondary | ICD-10-CM | POA: Diagnosis not present

## 2017-02-13 DIAGNOSIS — D631 Anemia in chronic kidney disease: Secondary | ICD-10-CM | POA: Diagnosis not present

## 2017-02-13 DIAGNOSIS — N186 End stage renal disease: Secondary | ICD-10-CM | POA: Diagnosis not present

## 2017-02-13 DIAGNOSIS — E1129 Type 2 diabetes mellitus with other diabetic kidney complication: Secondary | ICD-10-CM | POA: Diagnosis not present

## 2017-02-13 DIAGNOSIS — D509 Iron deficiency anemia, unspecified: Secondary | ICD-10-CM | POA: Diagnosis not present

## 2017-02-14 ENCOUNTER — Encounter: Payer: Medicare Other | Admitting: Surgery

## 2017-02-14 DIAGNOSIS — E1122 Type 2 diabetes mellitus with diabetic chronic kidney disease: Secondary | ICD-10-CM | POA: Diagnosis not present

## 2017-02-14 DIAGNOSIS — I120 Hypertensive chronic kidney disease with stage 5 chronic kidney disease or end stage renal disease: Secondary | ICD-10-CM | POA: Diagnosis not present

## 2017-02-14 DIAGNOSIS — L98491 Non-pressure chronic ulcer of skin of other sites limited to breakdown of skin: Secondary | ICD-10-CM | POA: Diagnosis not present

## 2017-02-14 DIAGNOSIS — Z992 Dependence on renal dialysis: Secondary | ICD-10-CM | POA: Diagnosis not present

## 2017-02-14 DIAGNOSIS — N186 End stage renal disease: Secondary | ICD-10-CM | POA: Diagnosis not present

## 2017-02-14 DIAGNOSIS — E11622 Type 2 diabetes mellitus with other skin ulcer: Secondary | ICD-10-CM | POA: Diagnosis not present

## 2017-02-14 DIAGNOSIS — S31102D Unspecified open wound of abdominal wall, epigastric region without penetration into peritoneal cavity, subsequent encounter: Secondary | ICD-10-CM | POA: Diagnosis not present

## 2017-02-15 DIAGNOSIS — D631 Anemia in chronic kidney disease: Secondary | ICD-10-CM | POA: Diagnosis not present

## 2017-02-15 DIAGNOSIS — E1129 Type 2 diabetes mellitus with other diabetic kidney complication: Secondary | ICD-10-CM | POA: Diagnosis not present

## 2017-02-15 DIAGNOSIS — D509 Iron deficiency anemia, unspecified: Secondary | ICD-10-CM | POA: Diagnosis not present

## 2017-02-15 DIAGNOSIS — N2581 Secondary hyperparathyroidism of renal origin: Secondary | ICD-10-CM | POA: Diagnosis not present

## 2017-02-15 DIAGNOSIS — N186 End stage renal disease: Secondary | ICD-10-CM | POA: Diagnosis not present

## 2017-02-16 NOTE — Progress Notes (Signed)
[name], [name] ([id number]) Visit Report for 02/14/2017 Chief Complaint Document Details Patient Name: [name], [name]. Date of Service: 02/14/2017 12:30 PM Medical Record Number: [id number] Patient Account Number: [id number] Date of Birth/Sex: 10/26/1940 (76 y.o. Male) Treating RN: Montey Hora Primary Care Provider: Tedra Senegal Other Clinician: Referring Provider: Tedra Senegal Treating Provider/Extender: Frann Rider in Treatment: 2 Information Obtained from: Patient Chief Complaint Patients presents for treatment of an open diabetic ulcer to the periumbilical area which he has had for 2 weeks. he had a similar problem in January of this year and the ulcer healed with offloading and local care Electronic Signature(s) Signed: 02/14/2017 12:57:12 PM By: Christin Fudge MD, FACS Entered By: Christin Fudge on 02/14/2017 12:57:12 [name], [name] ([id number]) -------------------------------------------------------------------------------- HPI Details Patient Name: [name]. Date of Service: 02/14/2017 12:30 PM Medical Record Number: [id number] Patient Account Number: [id number] Date of Birth/Sex: 1940-10-24 (75 y.o. Male) Treating RN: Montey Hora Primary Care Provider: Tedra Senegal Other Clinician: Referring Provider: Tedra Senegal Treating Provider/Extender: Frann Rider in Treatment: 2 History of Present Illness Location: periumbilical area. is on the left of the midline Quality: Patient reports No Pain. Severity: Patient states wound (s) are getting better. Duration: Patient has had the wound for < 2 weeks prior to presenting for treatment Context: The wound appeared gradually over time Modifying Factors: Patient is currently on renal dialysis and receives treatments 3 times weekly Associated Signs and Symptoms: Patient reports having:then local care with hydrogen peroxide and Bactroban ointment HPI Description: 76 year old gentleman seen earlier this year in  January for a abdominal wound is now back with the same problem which has recurred for about 2 weeks and he has been trying to apply some local antibiotic ointment and a Band-Aid there. Of note he has significant pressure in this area due to his beltline and may have put on some weight which results in his trousers being very tight around the waist. The else has changed in his HandP and his diabetes is pretty well controlled. most recent hemoglobin A1c on 12/06/2016 was 7.8%. he was reviewed by his endocrinologist Dr. Dwyane [name] who made appropriate modifications to his treatment plan. he was recently seen by neurology for mild cognitive impairment that was found to be normal for his age ====== Old notes The 76 year old gentleman who has a past medical history of diabetes mellitus, end-stage renal disease on dialysis, hypertension, secondary hyperparathyroidism, peripheral vascular disease and history of previous arterial bifemoral bypass graft in 1992 also has a history of gout, hyperlipidemia, hypothyroidism. last hemoglobin A1c was 7.5%. status post AV fistula placement, breast surgery for left granulomatous mastitis, triple a repair in 1992, small bowel obstruction, upper arm angiograms for AV fistula on the left side. was noted to have a superficial skin wound in the periumbilical area since late December 2017. He was asked to clean it with hydrogen peroxide and apply Bactroban. He was seen by vascular surgery in October 2017 where his right ABI was 0.56 left ABI was 0.87 and right TBI was 0.69 and left TBI was 0.70 ===== Electronic Signature(s) Signed: 02/14/2017 12:57:30 PM By: Christin Fudge MD, FACS Entered By: Christin Fudge on 02/14/2017 12:57:30 [name], [name] ([id number]) -------------------------------------------------------------------------------- Physical Exam Details Patient Name: [name]. Date of Service: 02/14/2017 12:30 PM Medical Record Number: [id number] Patient  Account Number: [id number] Date of Birth/Sex: 1941/07/24 (76 y.o. Male) Treating RN: Montey Hora Primary Care Provider: Tedra Senegal Other Clinician: Referring Provider: Tedra Senegal Treating Provider/Extender: Frann Rider in  Treatment: 2 Constitutional . Pulse regular. Respirations normal and unlabored. Afebrile. . Eyes Nonicteric. Reactive to light. Ears, Nose, Mouth, and Throat Lips, teeth, and gums WNL.Marland Kitchen Moist mucosa without lesions. Neck supple and nontender. No palpable supraclavicular or cervical adenopathy. Normal sized without goiter. Respiratory WNL. No retractions.. Cardiovascular Pedal Pulses WNL. No clubbing, cyanosis or edema. Lymphatic No adneopathy. No adenopathy. No adenopathy. Musculoskeletal Adexa without tenderness or enlargement.. Digits and nails w/o clubbing, cyanosis, infection, petechiae, ischemia, or inflammatory conditions.. Integumentary (Hair, Skin) No suspicious lesions. No crepitus or fluctuance. No peri-wound warmth or erythema. No masses.Marland Kitchen Psychiatric Judgement and insight Intact.. No evidence of depression, anxiety, or agitation.. Notes the wound looks very good today and as healthy granulation tissue and does not have any surrounding cellulitis. Electronic Signature(s) Signed: 02/14/2017 12:58:10 PM By: Christin Fudge MD, FACS Entered By: Christin Fudge on 02/14/2017 12:58:10 [name], [name] ([id number]) -------------------------------------------------------------------------------- Physician Orders Details Patient Name: [name]. Date of Service: 02/14/2017 12:30 PM Medical Record Number: [id number] Patient Account Number: [id number] Date of Birth/Sex: 06-19-1941 (76 y.o. Male) Treating RN: Montey Hora Primary Care Provider: Tedra Senegal Other Clinician: Referring Provider: Tedra Senegal Treating Provider/Extender: Frann Rider in Treatment: 2 Verbal / Phone Orders: No Diagnosis Coding Wound Cleansing Wound #2  Abdomen - midline o Clean wound with Normal Saline. o May Shower, gently pat wound dry prior to applying new dressing. Anesthetic Wound #2 Abdomen - midline o Topical Lidocaine 4% cream applied to wound bed prior to debridement Primary Wound Dressing Wound #2 Abdomen - midline o Hydrafera Blue Secondary Dressing Wound #2 Abdomen - midline o Dry Gauze o Boardered Foam Dressing Dressing Change Frequency Wound #2 Abdomen - midline o Change dressing every other day. Follow-up Appointments Wound #2 Abdomen - midline o Return Appointment in 1 week. Additional Orders / Instructions Wound #2 Abdomen - midline o Increase protein intake. o Other: - Please add vitamin A, vitamin C and zinc supplements to your diet Electronic Signature(s) Signed: 02/14/2017 4:20:02 PM By: Christin Fudge MD, FACS Signed: 02/14/2017 4:48:26 PM By: Liana Gerold ([id number]) Entered By: Montey Hora on 02/14/2017 12:55:47 Prichett, [name] ([id number]) -------------------------------------------------------------------------------- Problem List Details Patient Name: [name]. Date of Service: 02/14/2017 12:30 PM Medical Record Number: [id number] Patient Account Number: [id number] Date of Birth/Sex: [birth year] (76 y.o. Male) Treating RN: Montey Hora Primary Care Provider: Tedra Senegal Other Clinician: Referring Provider: Tedra Senegal Treating Provider/Extender: Frann Rider in Treatment: 2 Active Problems ICD-10 Encounter Code Description Active Date Diagnosis E11.622 Type 2 diabetes mellitus with other skin ulcer 01/28/2017 Yes S31.102D Unspecified open wound of abdominal wall, epigastric 01/28/2017 Yes region without penetration into peritoneal cavity, subsequent encounter Z99.2 Dependence on renal dialysis 01/28/2017 Yes Inactive Problems Resolved Problems Electronic Signature(s) Signed: 02/14/2017 12:56:58 PM By: Christin Fudge MD, FACS Entered By:  Christin Fudge on 02/14/2017 12:56:57 [name], [name] ([id number]) -------------------------------------------------------------------------------- Progress Note Details Patient Name: [name]. Date of Service: 02/14/2017 12:30 PM Medical Record Number: [id number] Patient Account Number: [id number] Date of Birth/Sex: Sep 21, 1941 (76 y.o. Male) Treating RN: Montey Hora Primary Care Provider: Tedra Senegal Other Clinician: Referring Provider: Tedra Senegal Treating Provider/Extender: Frann Rider in Treatment: 2 Subjective Chief Complaint Information obtained from Patient Patients presents for treatment of an open diabetic ulcer to the periumbilical area which he has had for 2 weeks. he had a similar problem in January of this year and the ulcer healed with offloading and local care History of Present Illness (HPI) The  following HPI elements were documented for the patient's wound: Location: periumbilical area. is on the left of the midline Quality: Patient reports No Pain. Severity: Patient states wound (s) are getting better. Duration: Patient has had the wound for < 2 weeks prior to presenting for treatment Context: The wound appeared gradually over time Modifying Factors: Patient is currently on renal dialysis and receives treatments 3 times weekly Associated Signs and Symptoms: Patient reports having:then local care with hydrogen peroxide and Bactroban ointment 76 year old gentleman seen earlier this year in January for a abdominal wound is now back with the same problem which has recurred for about 2 weeks and he has been trying to apply some local antibiotic ointment and a Band-Aid there. Of note he has significant pressure in this area due to his beltline and may have put on some weight which results in his trousers being very tight around the waist. The else has changed in his HandP and his diabetes is pretty well controlled. most recent hemoglobin A1c on 12/06/2016  was 7.8%. he was reviewed by his endocrinologist Dr. Dwyane [name] who made appropriate modifications to his treatment plan. he was recently seen by neurology for mild cognitive impairment that was found to be normal for his age ====== Old notes The 76 year old gentleman who has a past medical history of diabetes mellitus, end-stage renal disease on dialysis, hypertension, secondary hyperparathyroidism, peripheral vascular disease and history of previous arterial bifemoral bypass graft in 1992 also has a history of gout, hyperlipidemia, hypothyroidism. last hemoglobin A1c was 7.5%. status post AV fistula placement, breast surgery for left granulomatous mastitis, triple a repair in 1992, small bowel obstruction, upper arm angiograms for AV fistula on the left side. was noted to have a superficial skin wound in the periumbilical area since late December 2017. He was asked to clean it with hydrogen peroxide and apply Bactroban. He was seen by vascular surgery in October 2017 where his right ABI was 0.56 left ABI was 0.87 and right Fabio, [name]. ([id number]) TBI was 0.69 and left TBI was 0.70 ===== Objective Constitutional Pulse regular. Respirations normal and unlabored. Afebrile. Vitals Time Taken: 12:40 PM, Height: 72 in, Weight: 201 lbs, BMI: 27.3, Temperature: 98.3 F, Pulse: 61 bpm, Respiratory Rate: 18 breaths/min, Blood Pressure: 156/88 mmHg. Eyes Nonicteric. Reactive to light. Ears, Nose, Mouth, and Throat Lips, teeth, and gums WNL.Marland Kitchen Moist mucosa without lesions. Neck supple and nontender. No palpable supraclavicular or cervical adenopathy. Normal sized without goiter. Respiratory WNL. No retractions.. Cardiovascular Pedal Pulses WNL. No clubbing, cyanosis or edema. Lymphatic No adneopathy. No adenopathy. No adenopathy. Musculoskeletal Adexa without tenderness or enlargement.. Digits and nails w/o clubbing, cyanosis, infection, petechiae, ischemia, or inflammatory  conditions.Marland Kitchen Psychiatric Judgement and insight Intact.. No evidence of depression, anxiety, or agitation.. General Notes: the wound looks very good today and as healthy granulation tissue and does not have any surrounding cellulitis. Integumentary (Hair, Skin) No suspicious lesions. No crepitus or fluctuance. No peri-wound warmth or erythema. No masses.. Wound #2 status is Open. Original cause of wound was Gradually Appeared. The wound is located on the [name], [name]. ([id number]) Abdomen - midline. The wound measures 1.3cm length x 0.7cm width x 0.1cm depth; 0.715cm^2 area and 0.071cm^3 volume. The wound is limited to skin breakdown. There is no tunneling or undermining noted. There is a large amount of serous drainage noted. The wound margin is flat and intact. There is large (67- 100%) red granulation within the wound bed. There is a small (1-33%) amount of necrotic  tissue within the wound bed including Adherent Slough. The periwound skin appearance did not exhibit: Callus, Crepitus, Excoriation, Induration, Rash, Scarring, Dry/Scaly, Maceration, Atrophie Blanche, Cyanosis, Ecchymosis, Hemosiderin Staining, Mottled, Pallor, Rubor, Erythema. Periwound temperature was noted as No Abnormality. 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 May Shower, gently pat wound dry prior to applying new dressing. Anesthetic: Wound #2 Abdomen - midline: Topical Lidocaine 4% cream applied to wound bed prior to debridement Primary Wound Dressing: Wound #2 Abdomen - midline: Hydrafera Blue Secondary Dressing: Wound #2 Abdomen - midline: Dry Gauze Boardered Foam Dressing Dressing Change  Frequency: Wound #2 Abdomen - midline: Change dressing every other day. Follow-up Appointments: Wound #2 Abdomen - midline: Return Appointment in 1 week. Additional Orders / Instructions: [name], [name] ([id number]) Wound #2 Abdomen - midline: Increase protein intake. Other: - Please add vitamin A, vitamin C and zinc supplements to your diet He has made good recovery and improvement over the last week and I have recommended: 1. Hydrofera Blue with a bordered foam, to be changed daily after shower 2. Discussed offloading this area especially due to his baseline being around the scar tissue 3. Regular visits to the wound center Electronic Signature(s) Signed: 02/14/2017 12:58:37 PM By: Christin Fudge MD, FACS Entered By: Christin Fudge on 02/14/2017 12:58:36 [name], [name] ([id number]) -------------------------------------------------------------------------------- SuperBill Details Patient Name: [name]. Date of Service: 02/14/2017 Medical Record Number: [id number] Patient Account Number: [id number] Date of Birth/Sex: [birth year] (76 y.o. Male) Treating RN: Montey Hora Primary Care Provider: Tedra Senegal Other Clinician: Referring Provider: Tedra Senegal Treating Provider/Extender: Frann Rider in Treatment: 2 Diagnosis Coding ICD-10 Codes Code Description [phone] Type 2 diabetes mellitus with other skin ulcer Unspecified open wound of abdominal wall, epigastric region without penetration into S31.102D peritoneal cavity, subsequent encounter Z99.2 Dependence on renal dialysis Facility Procedures CPT4 Code: [id number] Description: [phone] - WOUND CARE VISIT-LEV 2 EST PT Modifier: Quantity: 1 Physician Procedures CPT4: Description Modifier Quantity Code [id number] - WC PHYS LEVEL 3 - EST PT 1 ICD-10 Description Diagnosis E11.622 Type 2 diabetes mellitus with other skin ulcer S31.102D Unspecified open wound of abdominal wall, epigastric region without  penetration  into peritoneal cavity, subsequent encounter Z99.2 Dependence on renal dialysis Electronic Signature(s) Signed: 02/14/2017 1:13:29 PM By: Montey Hora Signed: 02/14/2017 4:20:02 PM By: Christin Fudge MD, FACS Previous Signature: 02/14/2017 12:58:51 PM Version By: Christin Fudge MD, FACS Entered By: Montey Hora on 02/14/2017 13:13:29

## 2017-02-16 NOTE — Progress Notes (Signed)
EIVIN, MASCIO ([id number]) Visit Report for 02/14/2017 Arrival Information Details Patient Name: [name], [name]. Date of Service: 02/14/2017 12:30 PM Medical Record Number: [id number] Patient Account Number: [id number] Date of Birth/Sex: Jul 13, 1941 (76 y.o. Male) Treating RN: Montey Hora Primary Care Trace Wirick: Tedra Senegal Other Clinician: Referring Lyn Deemer: Tedra Senegal Treating Clayden Withem/Extender: Frann Rider in Treatment: 2 Visit Information History Since Last Visit Added or deleted any medications: No Patient Arrived: Ambulatory Any new allergies or adverse reactions: No Arrival Time: 12:39 Had a fall or experienced change in No Accompanied By: self activities of daily living that may affect Transfer Assistance: None risk of falls: Patient Identification Verified: Yes Signs or symptoms of abuse/neglect since last No Secondary Verification Process Yes visito Completed: Hospitalized since last visit: No Patient Has Alerts: Yes Has Dressing in Place as Prescribed: Yes Patient Alerts: Patient on Blood Pain Present Now: No Thinner DMII aspirin 650mg  Electronic Signature(s) Signed: 02/14/2017 4:48:26 PM By: Montey Hora Entered By: Montey Hora on 02/14/2017 12:39:30 Papandrea, [name] ([id number]) -------------------------------------------------------------------------------- Clinic Level of Care Assessment Details Patient Name: [name]. Date of Service: 02/14/2017 12:30 PM Medical Record Number: [id number] Patient Account Number: [id number] Date of Birth/Sex: July 18, 1941 (76 y.o. Male) Treating RN: Montey Hora Primary Care Chyrl Elwell: Tedra Senegal Other Clinician: Referring Tallia Moehring: Tedra Senegal Treating Loys Hoselton/Extender: Frann Rider in Treatment: 2 Clinic Level of Care Assessment Items TOOL 4 Quantity Score []  - Use when only an EandM is performed on FOLLOW-UP visit 0 ASSESSMENTS - Nursing Assessment / Reassessment X - Reassessment of  Co-morbidities (includes updates in patient status) 1 10 X - Reassessment of Adherence to Treatment Plan 1 5 ASSESSMENTS - Wound and Skin Assessment / Reassessment X - Simple Wound Assessment / Reassessment - one wound 1 5 []  - Complex Wound Assessment / Reassessment - multiple wounds 0 []  - Dermatologic / Skin Assessment (not related to wound area) 0 ASSESSMENTS - Focused Assessment []  - Circumferential Edema Measurements - multi extremities 0 []  - Nutritional Assessment / Counseling / Intervention 0 []  - Lower Extremity Assessment (monofilament, tuning fork, pulses) 0 []  - Peripheral Arterial Disease Assessment (using hand held doppler) 0 ASSESSMENTS - Ostomy and/or Continence Assessment and Care []  - Incontinence Assessment and Management 0 []  - Ostomy Care Assessment and Management (repouching, etc.) 0 PROCESS - Coordination of Care X - Simple Patient / Family Education for ongoing care 1 15 []  - Complex (extensive) Patient / Family Education for ongoing care 0 []  - Staff obtains Programmer, systems, Records, Test Results / Process Orders 0 []  - Staff telephones HHA, Nursing Homes / Clarify orders / etc 0 []  - Routine Transfer to another Facility (non-emergent condition) 0 Totman, [name] ([id number]) []  - Routine Hospital Admission (non-emergent condition) 0 []  - New Admissions / Biomedical engineer / Ordering NPWT, Apligraf, etc. 0 []  - Emergency Hospital Admission (emergent condition) 0 X - Simple Discharge Coordination 1 10 []  - Complex (extensive) Discharge Coordination 0 PROCESS - Special Needs []  - Pediatric / Minor Patient Management 0 []  - Isolation Patient Management 0 []  - Hearing / Language / Visual special needs 0 []  - Assessment of Community assistance (transportation, D/C planning, etc.) 0 []  - Additional assistance / Altered mentation 0 []  - Support Surface(s) Assessment (bed, cushion, seat, etc.) 0 INTERVENTIONS - Wound Cleansing / Measurement X - Simple Wound  Cleansing - one wound 1 5 []  - Complex Wound Cleansing - multiple wounds 0 X - Wound Imaging (photographs - any number of wounds) 1  5 []  - Wound Tracing (instead of photographs) 0 X - Simple Wound Measurement - one wound 1 5 []  - Complex Wound Measurement - multiple wounds 0 INTERVENTIONS - Wound Dressings X - Small Wound Dressing one or multiple wounds 1 10 []  - Medium Wound Dressing one or multiple wounds 0 []  - Large Wound Dressing one or multiple wounds 0 []  - Application of Medications - topical 0 []  - Application of Medications - injection 0 INTERVENTIONS - Miscellaneous []  - External ear exam 0 Thrall, [name] ([id number]) []  - Specimen Collection (cultures, biopsies, blood, body fluids, etc.) 0 []  - Specimen(s) / Culture(s) sent or taken to Lab for analysis 0 []  - Patient Transfer (multiple staff / Harrel Lemon Lift / Similar devices) 0 []  - Simple Staple / Suture removal (25 or less) 0 []  - Complex Staple / Suture removal (26 or more) 0 []  - Hypo / Hyperglycemic Management (close monitor of Blood Glucose) 0 []  - Ankle / Brachial Index (ABI) - do not check if billed separately 0 X - Vital Signs 1 5 Has the patient been seen at the hospital within the last three years: Yes Total Score: 75 Level Of Care: New/Established - Level 2 Electronic Signature(s) Signed: 02/14/2017 4:48:26 PM By: Montey Hora Entered By: Montey Hora on 02/14/2017 13:13:19 Stanger, [name] ([id number]) -------------------------------------------------------------------------------- Encounter Discharge Information Details Patient Name: [name]. Date of Service: 02/14/2017 12:30 PM Medical Record Number: [id number] Patient Account Number: [id number] Date of Birth/Sex: 25-Aug-1941 (76 y.o. Male) Treating RN: Montey Hora Primary Care Vyla Pint: Tedra Senegal Other Clinician: Referring Jonna Dittrich: Tedra Senegal Treating Muhamed Luecke/Extender: Frann Rider in Treatment: 2 Encounter Discharge  Information Items Discharge Pain Level: 0 Discharge Condition: Stable Ambulatory Status: Ambulatory Discharge Destination: Home Transportation: Private Auto Accompanied By: self Schedule Follow-up Appointment: Yes Medication Reconciliation completed and provided to Patient/Care No Valla Pacey: Provided on Clinical Summary of Care: 02/14/2017 Form Type Recipient Paper Patient WT Electronic Signature(s) Signed: 02/14/2017 1:03:21 PM By: Ruthine Dose Entered By: Ruthine Dose on 02/14/2017 13:03:21 Hora, [name] ([id number]) -------------------------------------------------------------------------------- Multi Wound Chart Details Patient Name: [name]. Date of Service: 02/14/2017 12:30 PM Medical Record Number: [id number] Patient Account Number: [id number] Date of Birth/Sex: 06-Nov-1940 (76 y.o. 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] [N/A:N/A] Comorbid History: [2:Anemia, Hypertension, Peripheral Venous Disease, Type II Diabetes, Gout, Neuropathy] [N/A:N/A] Date Acquired: [2:01/14/2017] [N/A:N/A] Weeks of Treatment: [2:2] [N/A:N/A] Wound Status: [2:Open] [N/A:N/A] Measurements L x W x D 1.3x0.7x0.1 [N/A:N/A] (cm) Area (cm) : [2:0.715] [N/A:N/A] Volume (cm) : [2:0.071] [N/A:N/A] % Reduction in Area: [2:73.20%] [N/A:N/A] % Reduction in Volume: 73.40% [N/A:N/A] Classification: [2:Category/Stage II] [N/A:N/A] Exudate Amount: [2:Large] [N/A:N/A] Exudate Type: [2:Serous]  [N/A:N/A] Exudate Color: [2:amber] [N/A:N/A] Wound Margin: [2:Flat and Intact] [N/A:N/A] Granulation Amount: [2:Large (67-100%)] [N/A:N/A] Granulation Quality: [2:Red] [N/A:N/A] Necrotic Amount: [2:Small (1-33%)] [N/A:N/A] Exposed Structures: [2:Fascia: No Fat Layer (Subcutaneous Tissue) Exposed: No Tendon: No Muscle: No] [N/A:N/A] Joint: No Bone: No Limited to Skin Breakdown Epithelialization: Small (1-33%) N/A N/A Periwound Skin Texture: Excoriation: No N/A N/A Induration: No Callus: No Crepitus: No Rash: No Scarring: No Periwound Skin Maceration: No N/A N/A Moisture: Dry/Scaly: No Periwound Skin Color: Atrophie  Blanche: No N/A N/A Cyanosis: No Ecchymosis: No Erythema: No Hemosiderin Staining: No Mottled: No Pallor: No Rubor: No Temperature: No Abnormality N/A N/A Tenderness on No N/A N/A Palpation: Wound Preparation: Ulcer Cleansing: N/A N/A Rinsed/Irrigated with Saline Topical Anesthetic Applied: Other: lidocaine 4% Treatment Notes Electronic Signature(s) Signed: 02/14/2017 12:57:03 PM By: Christin Fudge MD, FACS Entered By: Christin Fudge on 02/14/2017 12:57:03 Boback, [name] ([id number]) -------------------------------------------------------------------------------- [facility] Details Patient Name: [name]. Date of Service: 02/14/2017 12:30 PM Medical Record Number: [id number] Patient Account Number: [id number] Date of Birth/Sex: [birth year] (76 y.o. Male) Treating RN: Montey Hora Primary Care Sanay Belmar: Tedra Senegal Other Clinician: Referring Sirenia Whitis: Tedra Senegal Treating Mc Bloodworth/Extender: Frann Rider in Treatment: 2 Active Inactive ` Orientation to the Wound Care Program Nursing Diagnoses: Knowledge deficit related to the wound healing center program Goals: Patient/caregiver will verbalize understanding of the [facility] Program Date Initiated: 01/28/2017 Target Resolution Date: 04/19/2017 Goal Status:  Active Interventions: Provide education on orientation to the wound center Notes: ` Wound/Skin Impairment Nursing Diagnoses: Impaired tissue integrity Goals: Patient/caregiver will verbalize understanding of skin care regimen Date Initiated: 01/28/2017 Target Resolution Date: 04/19/2017 Goal Status: Active Ulcer/skin breakdown will have a volume reduction of 30% by week 4 Date Initiated: 01/28/2017 Target Resolution Date: 04/19/2017 Goal Status: Active Ulcer/skin breakdown will have a volume reduction of 50% by week 8 Date Initiated: 01/28/2017 Target Resolution Date: 04/19/2017 Goal Status: Active Ulcer/skin breakdown will have a volume reduction of 80% by week 12 Date Initiated: 01/28/2017 Target Resolution Date: 04/19/2017 Goal Status: Active Ulcer/skin breakdown will heal within 14 weeks Date Initiated: 01/28/2017 Target Resolution Date: 04/19/2017 TATEAnsar, Skoda ([id number]) Goal Status: Active Interventions: Assess patient/caregiver ability to obtain necessary supplies Assess patient/caregiver ability to perform ulcer/skin care regimen upon admission and as needed Assess ulceration(s) every visit Notes: Electronic Signature(s) Signed: 02/14/2017 4:48:26 PM By: Montey Hora Entered By: Montey Hora on 02/14/2017 12:54:30 Manthei, [name] ([id number]) -------------------------------------------------------------------------------- Pain Assessment Details Patient Name: [name]. Date of Service: 02/14/2017 12:30 PM Medical Record Number: [id number] Patient Account Number: [id number] Date of Birth/Sex: Dec 28, 1940 (76 y.o. Male) Treating RN: Montey Hora Primary Care Quentavious Rittenhouse: Tedra Senegal Other Clinician: Referring Chanin Frumkin: Tedra Senegal Treating Narjis Mira/Extender: Frann Rider in Treatment: 2 Active Problems Location of Pain Severity and Description of Pain Patient Has Paino No Site Locations Pain Management and Medication Current Pain  Management: Notes Topical or injectable lidocaine is offered to patient for acute pain when surgical debridement is performed. If needed, Patient is instructed to use over the counter pain medication for the following 24-48 hours after debridement. Wound care MDs do not prescribed pain medications. Patient has chronic pain or uncontrolled pain. Patient has been instructed to make an appointment with their Primary Care Physician for pain management. Electronic Signature(s) Signed: 02/14/2017 4:48:26 PM By: Montey Hora Entered By: Montey Hora on 02/14/2017 12:40:11 Doolin, [name] ([id number]) -------------------------------------------------------------------------------- Patient/Caregiver Education Details Patient Name: [name]. Date of Service: 02/14/2017 12:30 PM Medical Record Number: [id number] Patient Account Number: [id number] Date of Birth/Gender: September 07, 1941 (76 y.o. Male) Treating RN: Montey Hora Primary Care Physician: Tedra Senegal Other Clinician: Referring Physician: Tedra Senegal Treating Physician/Extender: Frann Rider in Treatment: 2 Education Assessment Education Provided To: Patient Education Topics Provided Wound/Skin Impairment: Handouts: Other: wound care as ordered Methods: Demonstration, Explain/Verbal Responses: State content correctly Electronic Signature(s) Signed: 02/14/2017 4:48:26 PM By: Montey Hora Entered By: Montey Hora on 02/14/2017 12:55:27 Calandra, [name] ([id number]) -------------------------------------------------------------------------------- Wound Assessment Details  Patient Name: [name], [name]. Date of Service: 02/14/2017 12:30 PM Medical Record Number: [id number] Patient Account Number: [id number] Date of Birth/Sex: 10/27/1940 (76 y.o. Male) Treating RN: Montey Hora Primary Care Dublin Grayer: Tedra Senegal Other Clinician: Referring Brayln Duque: Tedra Senegal Treating Bernhardt Riemenschneider/Extender: Frann Rider in  Treatment: 2 Wound Status Wound Number: 2 Primary Pressure Ulcer Etiology: Wound Location: Abdomen - midline Wound Open Wounding Event: Gradually Appeared Status: Date Acquired: 01/14/2017 Comorbid Anemia, Hypertension, Peripheral Weeks Of Treatment: 2 History: Venous Disease, Type II Diabetes, Clustered Wound: No Gout, Neuropathy Photos Photo Uploaded By: Montey Hora on 02/14/2017 14:10:22 Wound Measurements Length: (cm) 1.3 Width: (cm) 0.7 Depth: (cm) 0.1 Area: (cm) 0.715 Volume: (cm) 0.071 % Reduction in Area: 73.2% % Reduction in Volume: 73.4% Epithelialization: Small (1-33%) Tunneling: No Undermining: No Wound Description Classification: Category/Stage II Wound Margin: Flat and Intact Exudate Amount: Large Exudate Type: Serous Exudate Color: amber Foul Odor After Cleansing: No Slough/Fibrino Yes Wound Bed Granulation Amount: Large (67-100%) Exposed Structure Granulation Quality: Red Fascia Exposed: No Necrotic Amount: Small (1-33%) Fat Layer (Subcutaneous Tissue) Exposed: No Necrotic Quality: Adherent Slough Tendon Exposed: No Strohecker, [name] ([id number]) Muscle Exposed: No Joint Exposed: No Bone Exposed: No Limited to Skin Breakdown Periwound Skin Texture Texture Color No Abnormalities Noted: No No Abnormalities Noted: No Callus: No Atrophie Blanche: No Crepitus: No Cyanosis: No Excoriation: No Ecchymosis: No Induration: No Erythema: No Rash: No Hemosiderin Staining: No Scarring: No Mottled: No Pallor: No Moisture Rubor: No No Abnormalities Noted: No Dry / Scaly: No Temperature / Pain Maceration: No Temperature: No Abnormality Wound Preparation Ulcer Cleansing: Rinsed/Irrigated with Saline Topical Anesthetic Applied: Other: lidocaine 4%, Treatment Notes Wound #2 (Abdomen - midline) 1. Cleansed with: Clean wound with Normal Saline 2. Anesthetic Topical Lidocaine 4% cream to wound bed prior to debridement 4. Dressing  Applied: Hydrafera Blue 5. Secondary Dressing Applied Bordered Foam Dressing Electronic Signature(s) Signed: 02/14/2017 4:48:26 PM By: Montey Hora Entered By: Montey Hora on 02/14/2017 12:54:22 Wedge, [name] ([id number]) -------------------------------------------------------------------------------- [facility] Details Patient Name: [name]. Date of Service: 02/14/2017 12:30 PM Medical Record Number: [id number] Patient Account Number: [id number] Date of Birth/Sex: [birth year] (76 y.o. Male) Treating RN: Montey Hora Primary Care Carlson Belland: Tedra Senegal Other Clinician: Referring Deyna Carbon: Tedra Senegal Treating Izyan Ezzell/Extender: Frann Rider in Treatment: 2 Vital Signs Time Taken: 12:40 Temperature (F): 98.3 Height (in): 72 Pulse (bpm): 61 Weight (lbs): 201 Respiratory Rate (breaths/min): 18 Body Mass Index (BMI): 27.3 Blood Pressure (mmHg): 156/88 Reference Range: 80 - 120 mg / dl Electronic Signature(s) Signed: 02/14/2017 4:48:26 PM By: Montey Hora Entered By: Montey Hora on 02/14/2017 12:41:18

## 2017-02-18 DIAGNOSIS — D509 Iron deficiency anemia, unspecified: Secondary | ICD-10-CM | POA: Diagnosis not present

## 2017-02-18 DIAGNOSIS — N186 End stage renal disease: Secondary | ICD-10-CM | POA: Diagnosis not present

## 2017-02-18 DIAGNOSIS — N2581 Secondary hyperparathyroidism of renal origin: Secondary | ICD-10-CM | POA: Diagnosis not present

## 2017-02-18 DIAGNOSIS — E1129 Type 2 diabetes mellitus with other diabetic kidney complication: Secondary | ICD-10-CM | POA: Diagnosis not present

## 2017-02-18 DIAGNOSIS — D631 Anemia in chronic kidney disease: Secondary | ICD-10-CM | POA: Diagnosis not present

## 2017-02-18 NOTE — Patient Instructions (Signed)
Take Zithromax 1 tablet daily for 5 days. Call if not better in 24-48 hours or sooner if worse.

## 2017-02-20 DIAGNOSIS — N186 End stage renal disease: Secondary | ICD-10-CM | POA: Diagnosis not present

## 2017-02-20 DIAGNOSIS — D509 Iron deficiency anemia, unspecified: Secondary | ICD-10-CM | POA: Diagnosis not present

## 2017-02-20 DIAGNOSIS — E1129 Type 2 diabetes mellitus with other diabetic kidney complication: Secondary | ICD-10-CM | POA: Diagnosis not present

## 2017-02-20 DIAGNOSIS — D631 Anemia in chronic kidney disease: Secondary | ICD-10-CM | POA: Diagnosis not present

## 2017-02-20 DIAGNOSIS — N2581 Secondary hyperparathyroidism of renal origin: Secondary | ICD-10-CM | POA: Diagnosis not present

## 2017-02-21 ENCOUNTER — Encounter: Payer: Medicare Other | Admitting: Surgery

## 2017-02-21 DIAGNOSIS — L98492 Non-pressure chronic ulcer of skin of other sites with fat layer exposed: Secondary | ICD-10-CM | POA: Diagnosis not present

## 2017-02-21 DIAGNOSIS — I120 Hypertensive chronic kidney disease with stage 5 chronic kidney disease or end stage renal disease: Secondary | ICD-10-CM | POA: Diagnosis not present

## 2017-02-21 DIAGNOSIS — E1122 Type 2 diabetes mellitus with diabetic chronic kidney disease: Secondary | ICD-10-CM | POA: Diagnosis not present

## 2017-02-21 DIAGNOSIS — Z992 Dependence on renal dialysis: Secondary | ICD-10-CM | POA: Diagnosis not present

## 2017-02-21 DIAGNOSIS — N186 End stage renal disease: Secondary | ICD-10-CM | POA: Diagnosis not present

## 2017-02-21 DIAGNOSIS — E11622 Type 2 diabetes mellitus with other skin ulcer: Secondary | ICD-10-CM | POA: Diagnosis not present

## 2017-02-21 DIAGNOSIS — S31102D Unspecified open wound of abdominal wall, epigastric region without penetration into peritoneal cavity, subsequent encounter: Secondary | ICD-10-CM | POA: Diagnosis not present

## 2017-02-22 DIAGNOSIS — N186 End stage renal disease: Secondary | ICD-10-CM | POA: Diagnosis not present

## 2017-02-22 DIAGNOSIS — D631 Anemia in chronic kidney disease: Secondary | ICD-10-CM | POA: Diagnosis not present

## 2017-02-22 DIAGNOSIS — N2581 Secondary hyperparathyroidism of renal origin: Secondary | ICD-10-CM | POA: Diagnosis not present

## 2017-02-22 NOTE — Progress Notes (Signed)
MANOLO, BOSKET ([id number]) Visit Report for 02/21/2017 Arrival Information Details Patient Name: [name], [name]. Date of Service: 02/21/2017 9:15 AM Medical Record Number: [id number] Patient Account Number: [id number] Date of Birth/Sex: Sep 27, 1940 (76 y.o. Male) Treating RN: Baruch Gouty, RN, BSN, Velva Har[place] Primary Care Christhoper Busbee: Tedra Senegal Other Clinician: Referring Chailyn Racette: Tedra [place] Treating Alanii Ramer/Extender: Frann Rider in Treatment: 3 Visit Information History Since Last Visit All ordered tests and consults were completed: No Patient Arrived: Ambulatory Added or deleted any medications: No Arrival Time: 09:23 Any new allergies or adverse reactions: No Accompanied By: SELF Had a fall or experienced change in No Transfer Assistance: None activities of daily living that may affect Patient Identification Verified: Yes risk of falls: Secondary Verification Process Yes Signs or symptoms of abuse/neglect since last No Completed: visito Patient Has Alerts: Yes Hospitalized since last visit: No Patient Alerts: Patient on Blood Has Dressing in Place as Prescribed: Yes Thinner Pain Present Now: No DMII aspirin 650mg  Electronic Signature(s) Signed: 02/21/2017 3:19:34 PM By: Regan Lemming BSN, RN Entered By: Regan Lemming on 02/21/2017 09:26:30 Coke, [name] ([id number]) -------------------------------------------------------------------------------- Clinic Level of Care Assessment Details Patient Name: [name]. Date of Service: 02/21/2017 9:15 AM Medical Record Number: [id number] Patient Account Number: [id number] Date of Birth/Sex: 09/12/1941 (76 y.o. Male) Treating RN: Baruch Gouty, RN, BSN, [place] Primary Care Prabhjot Piscitello: Tedra Senegal Other Clinician: Referring Rileigh Kawashima: Tedra [place] Treating Diany Formosa/Extender: Frann Rider in Treatment: 3 Clinic Level of Care Assessment Items TOOL 4 Quantity Score []  - Use when only an EandM is performed on FOLLOW-UP visit  0 ASSESSMENTS - Nursing Assessment / Reassessment X - Reassessment of Co-morbidities (includes updates in patient status) 1 10 X - Reassessment of Adherence to Treatment Plan 1 5 ASSESSMENTS - Wound and Skin Assessment / Reassessment X - Simple Wound Assessment / Reassessment - one wound 1 5 []  - Complex Wound Assessment / Reassessment - multiple wounds 0 []  - Dermatologic / Skin Assessment (not related to wound area) 0 ASSESSMENTS - Focused Assessment []  - Circumferential Edema Measurements - multi extremities 0 []  - Nutritional Assessment / Counseling / Intervention 0 []  - Lower Extremity Assessment (monofilament, tuning fork, pulses) 0 []  - Peripheral Arterial Disease Assessment (using hand held doppler) 0 ASSESSMENTS - Ostomy and/or Continence Assessment and Care []  - Incontinence Assessment and Management 0 []  - Ostomy Care Assessment and Management (repouching, etc.) 0 PROCESS - Coordination of Care X - Simple Patient / Family Education for ongoing care 1 15 []  - Complex (extensive) Patient / Family Education for ongoing care 0 []  - Staff obtains Programmer, systems, Records, Test Results / Process Orders 0 []  - Staff telephones HHA, Nursing Homes / Clarify orders / etc 0 []  - Routine Transfer to another Facility (non-emergent condition) 0 Yonker, [name] ([id number]) []  - Routine Hospital Admission (non-emergent condition) 0 []  - New Admissions / Biomedical engineer / Ordering NPWT, Apligraf, etc. 0 []  - Emergency Hospital Admission (emergent condition) 0 []  - Simple Discharge Coordination 0 []  - Complex (extensive) Discharge Coordination 0 PROCESS - Special Needs []  - Pediatric / Minor Patient Management 0 []  - Isolation Patient Management 0 []  - Hearing / Language / Visual special needs 0 []  - Assessment of Community assistance (transportation, D/C planning, etc.) 0 []  - Additional assistance / Altered mentation 0 []  - Support Surface(s) Assessment (bed, cushion, seat, etc.)  0 INTERVENTIONS - Wound Cleansing / Measurement X - Simple Wound Cleansing - one wound 1 5 []  - Complex Wound Cleansing - multiple  wounds 0 X - Wound Imaging (photographs - any number of wounds) 1 5 []  - Wound Tracing (instead of photographs) 0 X - Simple Wound Measurement - one wound 1 5 []  - Complex Wound Measurement - multiple wounds 0 INTERVENTIONS - Wound Dressings X - Small Wound Dressing one or multiple wounds 1 10 []  - Medium Wound Dressing one or multiple wounds 0 []  - Large Wound Dressing one or multiple wounds 0 []  - Application of Medications - topical 0 []  - Application of Medications - injection 0 INTERVENTIONS - Miscellaneous []  - External ear exam 0 Rossman, [name] ([id number]) []  - Specimen Collection (cultures, biopsies, blood, body fluids, etc.) 0 []  - Specimen(s) / Culture(s) sent or taken to Lab for analysis 0 []  - Patient Transfer (multiple staff / Harrel Lemon Lift / Similar devices) 0 []  - Simple Staple / Suture removal (25 or less) 0 []  - Complex Staple / Suture removal (26 or more) 0 []  - Hypo / Hyperglycemic Management (close monitor of Blood Glucose) 0 []  - Ankle / Brachial Index (ABI) - do not check if billed separately 0 X - Vital Signs 1 5 Has the patient been seen at the hospital within the last three years: Yes Total Score: 65 Level Of Care: New/Established - Level 2 Electronic Signature(s) Signed: 02/21/2017 3:19:34 PM By: Regan Lemming BSN, RN Entered By: Regan Lemming on 02/21/2017 09:38:12 Cariker, [name] ([id number]) -------------------------------------------------------------------------------- Encounter Discharge Information Details Patient Name: [name]. Date of Service: 02/21/2017 9:15 AM Medical Record Number: [id number] Patient Account Number: [id number] Date of Birth/Sex: 1941-05-07 (76 y.o. Male) Treating RN: Baruch Gouty, RN, BSN, Velva Harman Primary Care Laron Boorman: Tedra Senegal Other Clinician: Referring Gwenivere Hiraldo: Tedra Senegal Treating  Tesslyn Baumert/Extender: Frann Rider in Treatment: 3 Encounter Discharge Information Items Discharge Pain Level: 0 Discharge Condition: Stable Ambulatory Status: Ambulatory Discharge Destination: Home Private Transportation: Auto Accompanied By: SELF Schedule Follow-up Appointment: No Medication Reconciliation completed and No provided to Patient/Care Hung Rhinesmith: Clinical Summary of Care: Electronic Signature(s) Signed: 02/21/2017 3:19:34 PM By: Regan Lemming BSN, RN Previous Signature: 02/21/2017 9:47:13 AM Version By: Ruthine Dose Entered By: Regan Lemming on 02/21/2017 09:48:33 Howes, [name] ([id number]) -------------------------------------------------------------------------------- Lower Extremity Assessment Details Patient Name: [name]. Date of Service: 02/21/2017 9:15 AM Medical Record Number: [id number] Patient Account Number: [id number] Date of Birth/Sex: [birth year] (76 y.o. Male) Treating RN: Baruch Gouty, RN, BSN, [place] Primary Care Takuya Lariccia: Tedra Senegal Other Clinician: Referring Ema Hebner: Tedra [place]egal Treating Annet Manukyan/Extender: Frann Rider in Treatment: 3 Electronic Signature(s) Signed: 02/21/2017 3:19:34 PM By: Regan Lemming BSN, RN Entered By: Regan Lemming on 02/21/2017 09:31:25 Urbanik, [name] ([id number]) -------------------------------------------------------------------------------- Multi Wound Chart Details Patient Name: [name]. Date of Service: 02/21/2017 9:15 AM Medical Record Number: [id number] Patient Account Number: [id number] Date of Birth/Sex: 06-07-1941 (76 y.o. 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] [N/A:N/A] Comorbid History: [2:Anemia, Hypertension, Peripheral Venous Disease, Type II Diabetes, Gout, Neuropathy] [N/A:N/A] Date Acquired: [2:01/14/2017] [N/A:N/A] Weeks of Treatment: [2:3] [N/A:N/A] Wound Status: [2:Open] [N/A:N/A] Measurements L x W x D 2.2x0.8x0.1 [N/A:N/A] (cm) Area (cm) : [2:1.382] [N/A:N/A] Volume (cm) : [2:0.138] [N/A:N/A] % Reduction in Area: [2:48.20%] [N/A:N/A] % Reduction  in Volume: 48.30% [N/A:N/A] Classification: [2:Category/Stage II] [N/A:N/A] Exudate Amount: [2:Large] [N/A:N/A] Exudate Type: [2:Serous] [N/A:N/A] Exudate Color: [2:amber] [N/A:N/A] Wound Margin: [2:Flat and Intact] [N/A:N/A] Granulation Amount: [2:Large (67-100%)] [N/A:N/A] Granulation Quality: [2:Red] [N/A:N/A] Necrotic Amount: [2:Small (1-33%)] [N/A:N/A] Exposed Structures: [2:Fat Layer (Subcutaneous N/A Tissue) Exposed: Yes Fascia: No Tendon: No Muscle: No] Joint: No Bone: No Epithelialization: Small (1-33%) N/A N/A Periwound Skin Texture: Excoriation: No N/A N/A Induration: No Callus: No Crepitus: No Rash: No Scarring: No Periwound Skin Maceration: Yes N/A N/A Moisture: Dry/Scaly: No Periwound Skin Color: Atrophie Blanche: No N/A N/A Cyanosis: No Ecchymosis: No Erythema: No Hemosiderin Staining: No Mottled: No Pallor: No Rubor: No Temperature: No Abnormality N/A N/A Tenderness on No N/A N/A Palpation: Wound Preparation: Ulcer Cleansing: N/A N/A Rinsed/Irrigated with Saline Topical Anesthetic Applied: Other: lidocaine 4% Treatment Notes Wound #2 (Abdomen - midline) 1. Cleansed with: Clean wound with Normal Saline 3. Peri-wound Care: Skin Prep 4. Dressing Applied: Hydrafera Blue 5. Secondary Dressing Applied Bordered Foam Dressing Electronic Signature(s) Signed: 02/21/2017 10:11:33 AM By:  Christin Fudge MD, FACS Entered By: Christin Fudge on 02/21/2017 10:11:33 Brecheen, [name] ([id number]) -------------------------------------------------------------------------------- [place] Details Patient Name: [name]. Date of Service: 02/21/2017 9:15 AM Medical Record Number: [id number] Patient Account Number: [id number] Date of Birth/Sex: 01-20-1941 (76 y.o. Male) Treating RN: Baruch Gouty, RN, BSN, [place] Primary Care Hulbert Branscome: Tedra Senegal Other Clinician: Referring Iniko Robles: Tedra Senegal Treating Zaryah Seckel/Extender: Frann Rider in Treatment: 3 Active Inactive ` Orientation to the Wound Care Program Nursing Diagnoses: Knowledge deficit related to the wound healing center program Goals: Patient/caregiver will verbalize understanding of the [place] Program Date Initiated: 01/28/2017 Target Resolution Date: 04/19/2017 Goal Status: Active Interventions: Provide education on orientation to the wound center Notes: ` Wound/Skin Impairment Nursing Diagnoses: Impaired tissue integrity Goals: Patient/caregiver will verbalize understanding of skin care regimen Date Initiated: 01/28/2017 Target Resolution Date: 04/19/2017 Goal Status: Active Ulcer/skin breakdown will have a volume reduction of 30% by week 4 Date Initiated: 01/28/2017 Target Resolution Date: 04/19/2017 Goal Status: Active Ulcer/skin breakdown will have a volume reduction of 50% by week 8 Date Initiated: 01/28/2017 Target Resolution Date: 04/19/2017 Goal Status: Active Ulcer/skin breakdown will have a volume reduction of 80% by week 12 Date Initiated: 01/28/2017 Target Resolution Date: 04/19/2017 Goal Status: Active Ulcer/skin breakdown will heal within 14 weeks Date Initiated: 01/28/2017 Target Resolution Date: 04/19/2017 TATEMuhannad, Bignell ([id number]) Goal Status: Active Interventions: Assess patient/caregiver ability to obtain necessary supplies Assess patient/caregiver ability to  perform ulcer/skin care regimen upon admission and as needed Assess ulceration(s) every visit Notes: Electronic Signature(s) Signed: 02/21/2017 3:19:34 PM By: Regan Lemming BSN, RN Entered By: Regan Lemming on 02/21/2017 09:36:38 Brinkman, [name] ([id number]) -------------------------------------------------------------------------------- Pain Assessment Details Patient Name: [name]. Date of Service: 02/21/2017 9:15 AM Medical Record Number: [id number] Patient Account Number: [id number] Date of Birth/Sex: 1941/09/07 (76 y.o. Male) Treating RN: Baruch Gouty, RN, BSN, [place]va Har[place] Primary Care Irais Mottram: Tedra Senegal Other Clinician: Referring Khalid Lacko: Tedra Senegal Treating Lilja Soland/Extender: Frann Rider in Treatment: 3 Active Problems Location of Pain Severity and Description of Pain Patient Has Paino No Site Locations With Dressing Change: No Pain Management and Medication Current Pain Management: Electronic Signature(s) Signed: 02/21/2017 3:19:34 PM By: Regan Lemming BSN, RN Entered By: Regan Lemming on 02/21/2017 09:26:39 Slimp, [name] ([id number]) -------------------------------------------------------------------------------- Patient/Caregiver Education Details Patient Name: [name]. Date of Service: 02/21/2017 9:15 AM Medical Record Number: [id number] Patient Account Number: [id number] Date of Birth/Gender: 08-24-1941 (76 y.o. Male) Treating RN:  Afful, RN, BSN, Velva Harman Primary Care Physician: Tedra Senegal Other Clinician: Referring Physician: Tedra Senegal Treating Physician/Extender: Frann Rider in Treatment: 3 Education Assessment Education Provided To: Patient Education Topics Provided Basic Hygiene: Methods: Explain/Verbal Responses: State content correctly Welcome To [facility]: Methods: Explain/Verbal Responses: State content correctly Wound/Skin Impairment: Methods: Explain/Verbal Responses: State content correctly Electronic  Signature(s) Signed: 02/21/2017 3:19:34 PM By: Regan Lemming BSN, RN Entered By: Regan Lemming on 02/21/2017 09:49:08 Fehringer, [name] ([id number]) -------------------------------------------------------------------------------- Wound Assessment Details Patient Name: [name]. Date of Service: 02/21/2017 9:15 AM Medical Record Number: [id number] Patient Account Number: [id number] Date of Birth/Sex: Oct 20, 1940 (76 y.o. Male) Treating RN: Baruch Gouty, RN, BSN, [place] Primary Care Chancy Smigiel: Tedra Senegal Other Clinician: Referring Tyrrell Stephens: Tedra Senegal Treating Akeia Perot/Extender: Frann Rider in Treatment: 3 Wound Status Wound Number: 2 Primary Pressure Ulcer Etiology: Wound Location: Abdomen - midline Wound Open Wounding Event: Gradually Appeared Status: Date Acquired: 01/14/2017 Comorbid Anemia, Hypertension, Peripheral Weeks Of Treatment: 3 History: Venous Disease, Type II Diabetes, Clustered Wound: No Gout, Neuropathy Photos Photo Uploaded By: Regan Lemming on 02/21/2017 15:19:20 Wound Measurements Length: (cm) 2.2 Width: (cm) 0.8 Depth: (cm) 0.1 Area: (cm) 1.382 Volume: (cm) 0.138 % Reduction in Area: 48.2% % Reduction in Volume: 48.3% Epithelialization: Small (1-33%) Tunneling: No Undermining: No Wound Description Classification: Category/Stage II Wound Margin: Flat and Intact Exudate Amount: Large Exudate Type: Serous Exudate Color: amber Foul Odor After Cleansing: No Slough/Fibrino Yes Wound Bed Granulation Amount: Large (67-100%) Exposed Structure Granulation Quality: Red Fascia Exposed: No Necrotic Amount: Small (1-33%) Fat Layer (Subcutaneous Tissue) Exposed: Yes Necrotic Quality: Adherent Slough Tendon Exposed: No Annas, [name] ([id number]) Muscle Exposed: No Joint Exposed: No Bone Exposed: No Periwound Skin Texture Texture Color No Abnormalities Noted: No No Abnormalities Noted: No Callus: No Atrophie Blanche: No Crepitus: No Cyanosis:  No Excoriation: No Ecchymosis: No Induration: No Erythema: No Rash: No Hemosiderin Staining: No Scarring: No Mottled: No Pallor: No Moisture Rubor: No No Abnormalities Noted: No Dry / Scaly: No Temperature / Pain Maceration: Yes Temperature: No Abnormality Wound Preparation Ulcer Cleansing: Rinsed/Irrigated with Saline Topical Anesthetic Applied: Other: lidocaine 4%, Treatment Notes Wound #2 (Abdomen - midline) 1. Cleansed with: Clean wound with Normal Saline 3. Peri-wound Care: Skin Prep 4. Dressing Applied: Hydrafera Blue 5. Secondary Dressing Applied Bordered Foam Dressing Electronic Signature(s) Signed: 02/21/2017 3:19:34 PM By: Regan Lemming BSN, RN Entered By: Regan Lemming on 02/21/2017 09:31:08 Broxson, [name] ([id number]) -------------------------------------------------------------------------------- Vitals Details Patient Name: [name]. Date of Service: 02/21/2017 9:15 AM Medical Record Number: [id number] Patient Account Number: [id number] Date of Birth/Sex: [birth year] (76 y.o. Male) Treating RN: Afful, RN, BSN, [place] Primary Care Mayla Biddy: Tedra Senegal Other Clinician: Referring Siedah Sedor: Tedra [place] Treating Jaclynne Baldo/Extender: Frann Rider in Treatment: 3 Vital Signs Time Taken: 09:26 Pulse (bpm): 59 Height (in): 72 Respiratory Rate (breaths/min): 18 Weight (lbs): 201 Blood Pressure (mmHg): 132/63 Body Mass Index (BMI): 27.3 Reference Range: 80 - 120 mg / dl Electronic Signature(s) Signed: 02/21/2017 3:19:34 PM By: Regan Lemming BSN, RN Entered By: Regan Lemming on 02/21/2017 19:37:90

## 2017-02-23 NOTE — Progress Notes (Signed)
[name] ([id number]) Visit Report for 02/21/2017 Chief Complaint Document Details Patient Name: [name], [name]. Date of Service: 02/21/2017 9:15 AM Medical Record Number: [id number] Patient Account Number: [id number] Date of Birth/Sex: Mar 24, 1941 (76 y.o. Male) Treating RN: Baruch Gouty, RN, BSN, Velva Harman Primary Care Provider: Tedra Senegal Other Clinician: Referring Provider: Tedra Senegal Treating Provider/Extender: Frann Rider in Treatment: 3 Information Obtained from: Patient Chief Complaint Patients presents for treatment of an open diabetic ulcer to the periumbilical area which he has had for 2 weeks. he had a similar problem in January of this year and the ulcer healed with offloading and local care Electronic Signature(s) Signed: 02/21/2017 10:11:41 AM By: Christin Fudge MD, FACS Entered By: Christin Fudge on 02/21/2017 10:11:41 [name], [name] ([id number]) -------------------------------------------------------------------------------- HPI Details Patient Name: [name]. Date of Service: 02/21/2017 9:15 AM Medical Record Number: [id number] Patient Account Number: [id number] Date of Birth/Sex: [birth year] (76 y.o. Male) Treating RN: Baruch Gouty, RN, BSN, Velva Harman Primary Care Provider: Tedra Senegal Other Clinician: Referring Provider: Tedra Senegal Treating Provider/Extender: Frann Rider in Treatment: 3 History of Present Illness Location: periumbilical area. is on the left of the midline Quality: Patient reports No Pain. Severity: Patient states wound (s) are getting better. Duration: Patient has had the wound for < 2 weeks prior to presenting for treatment Context: The wound appeared gradually over time Modifying Factors: Patient is currently on renal dialysis and receives treatments 3 times weekly Associated Signs and Symptoms: Patient reports having:then local care with hydrogen peroxide and Bactroban ointment HPI Description: 76 year old gentleman seen earlier  this year in January for a abdominal wound is now back with the same problem which has recurred for about 2 weeks and he has been trying to apply some local antibiotic ointment and a Band-Aid there. Of note he has significant pressure in this area due to his beltline and may have put on some weight which results in his trousers being very tight around the waist. The else has changed in his HandP and his diabetes is pretty well controlled. most recent hemoglobin A1c on 12/06/2016 was 7.8%. he was reviewed by his endocrinologist Dr. Dwyane Dee who made appropriate modifications to his treatment plan. he was recently seen by neurology for mild cognitive impairment that was found to be normal for his age ====== Old notes The 76 year old gentleman who has a past medical history of diabetes mellitus, end-stage renal disease on dialysis, hypertension, secondary hyperparathyroidism, peripheral vascular disease and history of previous arterial bifemoral bypass graft in 1992 also has a history of gout, hyperlipidemia, hypothyroidism. last hemoglobin A1c was 7.5%. status post AV fistula placement, breast surgery for left granulomatous mastitis, triple a repair in 1992, small bowel obstruction, upper arm angiograms for AV fistula on the left side. was noted to have a superficial skin wound in the periumbilical area since late December 2017. He was asked to clean it with hydrogen peroxide and apply Bactroban. He was seen by vascular surgery in October 2017 where his right ABI was 0.56 left ABI was 0.87 and right TBI was 0.69 and left TBI was 0.70 ===== Electronic Signature(s) Signed: 02/21/2017 10:11:51 AM By: Christin Fudge MD, FACS Entered By: Christin Fudge on 02/21/2017 10:11:51 [name], [name] ([id number]) -------------------------------------------------------------------------------- Physical Exam Details Patient Name: [name]. Date of Service: 02/21/2017 9:15 AM Medical Record Number:  [id number] Patient Account Number: [id number] Date of Birth/Sex: July 18, 1941 (76 y.o. Male) Treating RN: Baruch Gouty, RN, BSN, Velva Harman Primary Care Provider: Tedra Senegal Other Clinician: Referring Provider: Tedra Senegal  Treating Provider/Extender: Christin Fudge Weeks in Treatment: 3 Constitutional . Pulse regular. Respirations normal and unlabored. Afebrile. . Eyes Nonicteric. Reactive to light. Ears, Nose, Mouth, and Throat Lips, teeth, and gums WNL.Marland Kitchen Moist mucosa without lesions. Neck supple and nontender. No palpable supraclavicular or cervical adenopathy. Normal sized without goiter. Respiratory WNL. No retractions.. Breath sounds WNL, No rubs, rales, rhonchi, or wheeze.. Cardiovascular Heart rhythm and rate regular, no murmur or gallop.. Pedal Pulses WNL. No clubbing, cyanosis or edema. Chest Breasts symmetical and no nipple discharge.. Breast tissue WNL, no masses, lumps, or tenderness.. Lymphatic No adneopathy. No adenopathy. No adenopathy. Musculoskeletal Adexa without tenderness or enlargement.. Digits and nails w/o clubbing, cyanosis, infection, petechiae, ischemia, or inflammatory conditions.. Integumentary (Hair, Skin) No suspicious lesions. No crepitus or fluctuance. No peri-wound warmth or erythema. No masses.Marland Kitchen Psychiatric Judgement and insight Intact.. No evidence of depression, anxiety, or agitation.. Notes the edges of the wound with a rising nicely and the central part is getting smaller and there is healthy granulation tissue and no sharp debridement was required today. Electronic Signature(s) Signed: 02/21/2017 10:12:15 AM By: Christin Fudge MD, FACS Entered By: Christin Fudge on 02/21/2017 10:12:14 [name] ([id number]) -------------------------------------------------------------------------------- Physician Orders Details Patient Name: [name]. Date of Service: 02/21/2017 9:15 AM Medical Record Number: [id number] Patient Account Number: [id number] Date  of Birth/Sex: 08-04-1941 (76 y.o. Male) Treating RN: Baruch Gouty, RN, BSN, Velva Harman Primary Care Provider: Tedra Senegal Other Clinician: Referring Provider: Tedra Senegal Treating Provider/Extender: Frann Rider in Treatment: 3 Verbal / Phone Orders: No Diagnosis Coding Wound Cleansing Wound #2 Abdomen - midline o Clean wound with Normal Saline. o May Shower, gently pat wound dry prior to applying new dressing. Anesthetic Wound #2 Abdomen - midline o Topical Lidocaine 4% cream applied to wound bed prior to debridement Primary Wound Dressing Wound #2 Abdomen - midline o Hydrafera Blue Secondary Dressing Wound #2 Abdomen - midline o Dry Gauze o Boardered Foam Dressing Dressing Change Frequency Wound #2 Abdomen - midline o Change dressing every other day. Follow-up Appointments Wound #2 Abdomen - midline o Return Appointment in 1 week. Additional Orders / Instructions Wound #2 Abdomen - midline o Increase protein intake. o Other: - TAKE ZINC, VITAMIN A AND VITAMIN C TO SUPPLEMENT AND AID IN WOUND HEALING Electronic Signature(s) Signed: 02/21/2017 3:19:34 PM By: Regan Lemming BSN, RN [name], [name] ([id number]) Signed: 02/21/2017 4:34:04 PM By: Christin Fudge MD, FACS Entered By: Regan Lemming on 02/21/2017 09:39:44 [name], [name] ([id number]) -------------------------------------------------------------------------------- Problem List Details Patient Name: [name]. Date of Service: 02/21/2017 9:15 AM Medical Record Number: [id number] Patient Account Number: [id number] Date of Birth/Sex: 04/02/1941 (76 y.o. Male) Treating RN: Baruch Gouty, RN, BSN, [place] Primary Care Provider: Tedra Senegal Other Clinician: Referring Provider: Tedra Senegal Treating Provider/Extender: Frann Rider in Treatment: 3 Active Problems ICD-10 Encounter Code Description Active Date Diagnosis E11.622 Type 2 diabetes mellitus with other skin ulcer 01/28/2017 Yes S31.102D  Unspecified open wound of abdominal wall, epigastric 01/28/2017 Yes region without penetration into peritoneal cavity, subsequent encounter Z99.2 Dependence on renal dialysis 01/28/2017 Yes Inactive Problems Resolved Problems Electronic Signature(s) Signed: 02/21/2017 10:11:27 AM By: Christin Fudge MD, FACS Entered By: Christin Fudge on 02/21/2017 10:11:27 Karow, [name] ([id number]) -------------------------------------------------------------------------------- Progress Note Details Patient Name: [name]. Date of Service: 02/21/2017 9:15 AM Medical Record Number: [id number] Patient Account Number: [id number] Date of Birth/Sex: Mar 10, 1941 (76 y.o. Male) Treating RN: Baruch Gouty, RN, BSN, Velva Harman Primary Care Provider: Tedra Senegal Other Clinician: Referring Provider: Tedra Senegal  Treating Provider/Extender: Frann Rider in Treatment: 3 Subjective Chief Complaint Information obtained from Patient Patients presents for treatment of an open diabetic ulcer to the periumbilical area which he has had for 2 weeks. he had a similar problem in January of this year and the ulcer healed with offloading and local care History of Present Illness (HPI) The following HPI elements were documented for the patient's wound: Location: periumbilical area. is on the left of the midline Quality: Patient reports No Pain. Severity: Patient states wound (s) are getting better. Duration: Patient has had the wound for < 2 weeks prior to presenting for treatment Context: The wound appeared gradually over time Modifying Factors: Patient is currently on renal dialysis and receives treatments 3 times weekly Associated Signs and Symptoms: Patient reports having:then local care with hydrogen peroxide and Bactroban ointment 76 year old gentleman seen earlier this year in January for a abdominal wound is now back with the same problem which has recurred for about 2 weeks and he has been trying to apply some local  antibiotic ointment and a Band-Aid there. Of note he has significant pressure in this area due to his beltline and may have put on some weight which results in his trousers being very tight around the waist. The else has changed in his HandP and his diabetes is pretty well controlled. most recent hemoglobin A1c on 12/06/2016 was 7.8%. he was reviewed by his endocrinologist Dr. Dwyane Dee who made appropriate modifications to his treatment plan. he was recently seen by neurology for mild cognitive impairment that was found to be normal for his age ====== Old notes The 76 year old gentleman who has a past medical history of diabetes mellitus, end-stage renal disease on dialysis, hypertension, secondary hyperparathyroidism, peripheral vascular disease and history of previous arterial bifemoral bypass graft in 1992 also has a history of gout, hyperlipidemia, hypothyroidism. last hemoglobin A1c was 7.5%. status post AV fistula placement, breast surgery for left granulomatous mastitis, triple a repair in 1992, small bowel obstruction, upper arm angiograms for AV fistula on the left side. was noted to have a superficial skin wound in the periumbilical area since late December 2017. He was asked to clean it with hydrogen peroxide and apply Bactroban. He was seen by vascular surgery in October 2017 where his right ABI was 0.56 left ABI was 0.87 and right [name], [name]. ([id number]) TBI was 0.69 and left TBI was 0.70 ===== Objective Constitutional Pulse regular. Respirations normal and unlabored. Afebrile. Vitals Time Taken: 9:26 AM, Height: 72 in, Weight: 201 lbs, BMI: 27.3, Pulse: 59 bpm, Respiratory Rate: 18 breaths/min, Blood Pressure: 132/63 mmHg. Eyes Nonicteric. Reactive to light. Ears, Nose, Mouth, and Throat Lips, teeth, and gums WNL.Marland Kitchen Moist mucosa without lesions. Neck supple and nontender. No palpable supraclavicular or cervical adenopathy. Normal sized without goiter. Respiratory WNL.  No retractions.. Breath sounds WNL, No rubs, rales, rhonchi, or wheeze.. Cardiovascular Heart rhythm and rate regular, no murmur or gallop.. Pedal Pulses WNL. No clubbing, cyanosis or edema. Chest Breasts symmetical and no nipple discharge.. Breast tissue WNL, no masses, lumps, or tenderness.. Lymphatic No adneopathy. No adenopathy. No adenopathy. Musculoskeletal Adexa without tenderness or enlargement.. Digits and nails w/o clubbing, cyanosis, infection, petechiae, ischemia, or inflammatory conditions.Marland Kitchen Psychiatric Judgement and insight Intact.. No evidence of depression, anxiety, or agitation.. General Notes: the edges of the wound with a rising nicely and the central part is getting smaller and there is healthy granulation tissue and no sharp debridement was required today. Integumentary (Hair, Skin) Strieter, Gwyndolyn Saxon R. (  [id number]) No suspicious lesions. No crepitus or fluctuance. No peri-wound warmth or erythema. No masses.. Wound #2 status is Open. Original cause of wound was Gradually Appeared. The wound is located on the Abdomen - midline. The wound measures 2.2cm length x 0.8cm width x 0.1cm depth; 1.382cm^2 area and 0.138cm^3 volume. There is Fat Layer (Subcutaneous Tissue) Exposed exposed. There is no tunneling or undermining noted. There is a large amount of serous drainage noted. The wound margin is flat and intact. There is large (67-100%) red granulation within the wound bed. There is a small (1-33%) amount of necrotic tissue within the wound bed including Adherent Slough. The periwound skin appearance exhibited: Maceration. The periwound skin appearance did not exhibit: Callus, Crepitus, Excoriation, Induration, Rash, Scarring, Dry/Scaly, Atrophie Blanche, Cyanosis, Ecchymosis, Hemosiderin Staining, Mottled, Pallor, Rubor, Erythema. Periwound temperature was noted as No Abnormality. Assessment Active Problems ICD-10 E11.622 - Type 2 diabetes mellitus with other skin  ulcer S31.102D - Unspecified open wound of abdominal wall, epigastric region without penetration into peritoneal cavity, subsequent encounter Z99.2 - Dependence on renal dialysis Plan Wound Cleansing: Wound #2 Abdomen - midline: Clean wound with Normal Saline. May Shower, gently pat wound dry prior to applying new dressing. Anesthetic: Wound #2 Abdomen - midline: Topical Lidocaine 4% cream applied to wound bed prior to debridement Primary Wound Dressing: Wound #2 Abdomen - midline: Hydrafera Blue Secondary Dressing: Wound #2 Abdomen - midline: Dry Gauze Boardered Foam Dressing Dressing Change Frequency: Wound #2 Abdomen - midline: Change dressing every other day. Follow-up Appointments: [name], [name] ([id number]) Wound #2 Abdomen - midline: Return Appointment in 1 week. Additional Orders / Instructions: Wound #2 Abdomen - midline: Increase protein intake. Other: - TAKE ZINC, VITAMIN A AND VITAMIN C TO SUPPLEMENT AND AID IN WOUND HEALING I have recommended: 1. Hydrofera Blue with a bordered foam, to be changed daily after shower 2. Discussed offloading this area especially due to his baseline being around the scar tissue 3. Regular visits to the wound center. Electronic Signature(s) Signed: 02/21/2017 10:18:32 AM By: Christin Fudge MD, FACS Entered By: Christin Fudge on 02/21/2017 10:18:32 [name], [name] ([id number]) -------------------------------------------------------------------------------- SuperBill Details Patient Name: [name]. Date of Service: 02/21/2017 Medical Record Number: [id number] Patient Account Number: [id number] Date of Birth/Sex: March 21, 1941 (76 y.o. Male) Treating RN: Baruch Gouty, RN, BSN, Velva Harman Primary Care Provider: Tedra Senegal Other Clinician: Referring Provider: Tedra Senegal Treating Provider/Extender: Frann Rider in Treatment: 3 Diagnosis Coding ICD-10 Codes Code Description [phone] Type 2 diabetes mellitus with other skin  ulcer Unspecified open wound of abdominal wall, epigastric region without penetration into S31.102D peritoneal cavity, subsequent encounter Z99.2 Dependence on renal dialysis Facility Procedures CPT4 Code: [id number] Description: [id number] - WOUND CARE VISIT-LEV 2 EST PT Modifier: Quantity: 1 Physician Procedures CPT4: Description Modifier Quantity Code [id number] - WC PHYS LEVEL 3 - EST PT 1 ICD-10 Description Diagnosis E11.622 Type 2 diabetes mellitus with other skin ulcer S31.102D Unspecified open wound of abdominal wall, epigastric region without  penetration into peritoneal cavity, subsequent encounter Z99.2 Dependence on renal dialysis Electronic Signature(s) Signed: 02/21/2017 10:27:01 AM By: Christin Fudge MD, FACS Entered By: Christin Fudge on 02/21/2017 10:27:01

## 2017-02-25 DIAGNOSIS — D631 Anemia in chronic kidney disease: Secondary | ICD-10-CM | POA: Diagnosis not present

## 2017-02-25 DIAGNOSIS — N186 End stage renal disease: Secondary | ICD-10-CM | POA: Diagnosis not present

## 2017-02-25 DIAGNOSIS — N2581 Secondary hyperparathyroidism of renal origin: Secondary | ICD-10-CM | POA: Diagnosis not present

## 2017-02-27 DIAGNOSIS — N186 End stage renal disease: Secondary | ICD-10-CM | POA: Diagnosis not present

## 2017-02-27 DIAGNOSIS — N2581 Secondary hyperparathyroidism of renal origin: Secondary | ICD-10-CM | POA: Diagnosis not present

## 2017-02-27 DIAGNOSIS — D631 Anemia in chronic kidney disease: Secondary | ICD-10-CM | POA: Diagnosis not present

## 2017-02-28 ENCOUNTER — Encounter: Payer: Medicare Other | Attending: Surgery | Admitting: Surgery

## 2017-02-28 DIAGNOSIS — X58XXXD Exposure to other specified factors, subsequent encounter: Secondary | ICD-10-CM | POA: Diagnosis not present

## 2017-02-28 DIAGNOSIS — Z880 Allergy status to penicillin: Secondary | ICD-10-CM | POA: Insufficient documentation

## 2017-02-28 DIAGNOSIS — E1140 Type 2 diabetes mellitus with diabetic neuropathy, unspecified: Secondary | ICD-10-CM | POA: Insufficient documentation

## 2017-02-28 DIAGNOSIS — Z794 Long term (current) use of insulin: Secondary | ICD-10-CM | POA: Diagnosis not present

## 2017-02-28 DIAGNOSIS — S31102D Unspecified open wound of abdominal wall, epigastric region without penetration into peritoneal cavity, subsequent encounter: Secondary | ICD-10-CM | POA: Diagnosis not present

## 2017-02-28 DIAGNOSIS — I120 Hypertensive chronic kidney disease with stage 5 chronic kidney disease or end stage renal disease: Secondary | ICD-10-CM | POA: Insufficient documentation

## 2017-02-28 DIAGNOSIS — Z992 Dependence on renal dialysis: Secondary | ICD-10-CM | POA: Insufficient documentation

## 2017-02-28 DIAGNOSIS — Z79899 Other long term (current) drug therapy: Secondary | ICD-10-CM | POA: Diagnosis not present

## 2017-02-28 DIAGNOSIS — E11622 Type 2 diabetes mellitus with other skin ulcer: Secondary | ICD-10-CM | POA: Diagnosis not present

## 2017-02-28 DIAGNOSIS — M109 Gout, unspecified: Secondary | ICD-10-CM | POA: Diagnosis not present

## 2017-02-28 DIAGNOSIS — E785 Hyperlipidemia, unspecified: Secondary | ICD-10-CM | POA: Insufficient documentation

## 2017-02-28 DIAGNOSIS — N186 End stage renal disease: Secondary | ICD-10-CM | POA: Diagnosis not present

## 2017-02-28 DIAGNOSIS — D649 Anemia, unspecified: Secondary | ICD-10-CM | POA: Insufficient documentation

## 2017-02-28 DIAGNOSIS — E1122 Type 2 diabetes mellitus with diabetic chronic kidney disease: Secondary | ICD-10-CM | POA: Diagnosis not present

## 2017-02-28 DIAGNOSIS — L98492 Non-pressure chronic ulcer of skin of other sites with fat layer exposed: Secondary | ICD-10-CM | POA: Diagnosis not present

## 2017-02-28 DIAGNOSIS — Z87891 Personal history of nicotine dependence: Secondary | ICD-10-CM | POA: Diagnosis not present

## 2017-02-28 DIAGNOSIS — Z7982 Long term (current) use of aspirin: Secondary | ICD-10-CM | POA: Diagnosis not present

## 2017-02-28 DIAGNOSIS — E039 Hypothyroidism, unspecified: Secondary | ICD-10-CM | POA: Insufficient documentation

## 2017-03-01 DIAGNOSIS — N2581 Secondary hyperparathyroidism of renal origin: Secondary | ICD-10-CM | POA: Diagnosis not present

## 2017-03-01 DIAGNOSIS — N186 End stage renal disease: Secondary | ICD-10-CM | POA: Diagnosis not present

## 2017-03-01 DIAGNOSIS — D631 Anemia in chronic kidney disease: Secondary | ICD-10-CM | POA: Diagnosis not present

## 2017-03-02 NOTE — Progress Notes (Signed)
KOREE, [name] ([id number]) Visit Report for 02/28/2017 Arrival Information Details Patient Name: [name], [name]. Date of Service: 02/28/2017 1:30 PM Medical Record Number: [id number] Patient Account Number: [id number] Date of Birth/Sex: [birth year] (76 y.o. Male) Treating RN: Montey Hora Primary Care Miciah Shealy: Tedra Senegal Other Clinician: Referring Shiara Mcgough: Tedra Senegal Treating Nevada Kirchner/Extender: Frann Rider in Treatment: 4 Visit Information History Since Last Visit Added or deleted any medications: No Patient Arrived: Ambulatory Any new allergies or adverse reactions: No Arrival Time: 13:33 Had a fall or experienced change in No Accompanied By: self activities of daily living that may affect Transfer Assistance: None risk of falls: Patient Identification Verified: Yes Signs or symptoms of abuse/neglect since last No Secondary Verification Process Yes visito Completed: Hospitalized since last visit: No Patient Has Alerts: Yes Has Dressing in Place as Prescribed: Yes Patient Alerts: Patient on Blood Pain Present Now: No Thinner DMII aspirin 650mg  Electronic Signature(s) Signed: 02/28/2017 4:27:13 PM By: Montey Hora Entered By: Montey Hora on 02/28/2017 13:33:19 Wiebelhaus, [name] ([id number]) -------------------------------------------------------------------------------- Clinic Level of Care Assessment Details Patient Name: [name]. Date of Service: 02/28/2017 1:30 PM Medical Record Number: [id number] Patient Account Number: [id number] Date of Birth/Sex: [birth year] (76 y.o. Male) Treating RN: Montey Hora Primary Care Iliyah Bui: Tedra Senegal Other Clinician: Referring Lavel Rieman: Tedra Senegal Treating Sharnell Knight/Extender: Frann Rider in Treatment: 4 Clinic Level of Care Assessment Items TOOL 4 Quantity Score []  - Use when only an EandM is performed on FOLLOW-UP visit 0 ASSESSMENTS - Nursing Assessment / Reassessment X - Reassessment of  Co-morbidities (includes updates in patient status) 1 10 X - Reassessment of Adherence to Treatment Plan 1 5 ASSESSMENTS - Wound and Skin Assessment / Reassessment X - Simple Wound Assessment / Reassessment - one wound 1 5 []  - Complex Wound Assessment / Reassessment - multiple wounds 0 []  - Dermatologic / Skin Assessment (not related to wound area) 0 ASSESSMENTS - Focused Assessment []  - Circumferential Edema Measurements - multi extremities 0 []  - Nutritional Assessment / Counseling / Intervention 0 []  - Lower Extremity Assessment (monofilament, tuning fork, pulses) 0 []  - Peripheral Arterial Disease Assessment (using hand held doppler) 0 ASSESSMENTS - Ostomy and/or Continence Assessment and Care []  - Incontinence Assessment and Management 0 []  - Ostomy Care Assessment and Management (repouching, etc.) 0 PROCESS - Coordination of Care X - Simple Patient / Family Education for ongoing care 1 15 []  - Complex (extensive) Patient / Family Education for ongoing care 0 []  - Staff obtains Programmer, systems, Records, Test Results / Process Orders 0 []  - Staff telephones HHA, Nursing Homes / Clarify orders / etc 0 []  - Routine Transfer to another Facility (non-emergent condition) 0 Sen, [name] ([id number]) []  - Routine Hospital Admission (non-emergent condition) 0 []  - New Admissions / Biomedical engineer / Ordering NPWT, Apligraf, etc. 0 []  - Emergency Hospital Admission (emergent condition) 0 X - Simple Discharge Coordination 1 10 []  - Complex (extensive) Discharge Coordination 0 PROCESS - Special Needs []  - Pediatric / Minor Patient Management 0 []  - Isolation Patient Management 0 []  - Hearing / Language / Visual special needs 0 []  - Assessment of Community assistance (transportation, D/C planning, etc.) 0 []  - Additional assistance / Altered mentation 0 []  - Support Surface(s) Assessment (bed, cushion, seat, etc.) 0 INTERVENTIONS - Wound Cleansing / Measurement X - Simple Wound  Cleansing - one wound 1 5 []  - Complex Wound Cleansing - multiple wounds 0 X - Wound Imaging (photographs - any number of wounds) 1  5 []  - Wound Tracing (instead of photographs) 0 X - Simple Wound Measurement - one wound 1 5 []  - Complex Wound Measurement - multiple wounds 0 INTERVENTIONS - Wound Dressings X - Small Wound Dressing one or multiple wounds 1 10 []  - Medium Wound Dressing one or multiple wounds 0 []  - Large Wound Dressing one or multiple wounds 0 []  - Application of Medications - topical 0 []  - Application of Medications - injection 0 INTERVENTIONS - Miscellaneous []  - External ear exam 0 Fair, [name] ([id number]) []  - Specimen Collection (cultures, biopsies, blood, body fluids, etc.) 0 []  - Specimen(s) / Culture(s) sent or taken to Lab for analysis 0 []  - Patient Transfer (multiple staff / Harrel Lemon Lift / Similar devices) 0 []  - Simple Staple / Suture removal (25 or less) 0 []  - Complex Staple / Suture removal (26 or more) 0 []  - Hypo / Hyperglycemic Management (close monitor of Blood Glucose) 0 []  - Ankle / Brachial Index (ABI) - do not check if billed separately 0 X - Vital Signs 1 5 Has the patient been seen at the hospital within the last three years: Yes Total Score: 75 Level Of Care: New/Established - Level 2 Electronic Signature(s) Signed: 02/28/2017 4:27:13 PM By: Montey Hora Entered By: Montey Hora on 02/28/2017 13:59:56 Schaus, [name] ([id number]) -------------------------------------------------------------------------------- Encounter Discharge Information Details Patient Name: [name]. Date of Service: 02/28/2017 1:30 PM Medical Record Number: [id number] Patient Account Number: [id number] Date of Birth/Sex: 1940-12-06 (76 y.o. Male) Treating RN: Montey Hora Primary Care Cyril Woodmansee: Tedra Senegal Other Clinician: Referring Lonell Stamos: Tedra Senegal Treating Zerenity Bowron/Extender: Frann Rider in Treatment: 4 Encounter Discharge Information  Items Discharge Pain Level: 0 Discharge Condition: Stable Ambulatory Status: Ambulatory Discharge Destination: Home Transportation: Private Auto Accompanied By: self Schedule Follow-up Appointment: Yes Medication Reconciliation completed No and provided to Patient/Care Sarely Stracener: Patient Clinical Summary of Care: Declined Electronic Signature(s) Signed: 02/28/2017 2:01:23 PM By: Sharon Mt Entered By: Sharon Mt on 02/28/2017 14:01:22 Diener, [name] ([id number]) -------------------------------------------------------------------------------- Multi Wound Chart Details Patient Name: [name]. Date of Service: 02/28/2017 1:30 PM Medical Record Number: [id number] Patient Account Number: [id number] Date of Birth/Sex: 04-Jul-1941 (76 y.o. 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] [N/A:N/A] Comorbid History: [2:Anemia, Hypertension, Peripheral Venous Disease, Type II Diabetes, Gout, Neuropathy] [N/A:N/A] Date Acquired: [2:01/14/2017] [N/A:N/A] Weeks of Treatment: [2:4] [N/A:N/A] Wound Status: [2:Open] [N/A:N/A] Measurements L x W x D 2x0.4x0.1 [N/A:N/A] (cm) Area (cm) : [2:0.628] [N/A:N/A] Volume (cm) : [2:0.063] [N/A:N/A] % Reduction in Area: [2:76.50%] [N/A:N/A] % Reduction in Volume: 76.40% [N/A:N/A] Classification: [2:Category/Stage II] [N/A:N/A] Exudate Amount: [2:Large] [N/A:N/A] Exudate Type: [2:Serous] [N/A:N/A] Exudate Color: [2:amber] [N/A:N/A] Wound Margin: [2:Flat and Intact]  [N/A:N/A] Granulation Amount: [2:Large (67-100%)] [N/A:N/A] Granulation Quality: [2:Red] [N/A:N/A] Necrotic Amount: [2:None Present (0%)] [N/A:N/A] Exposed Structures: [2:Fat Layer (Subcutaneous N/A Tissue) Exposed: Yes Fascia: No Tendon: No Muscle: No] Joint: No Bone: No Epithelialization: Small (1-33%) N/A N/A Periwound Skin Texture: Excoriation: No N/A N/A Induration: No Callus: No Crepitus: No Rash: No Scarring: No Periwound Skin Maceration: Yes N/A N/A Moisture: Dry/Scaly: No Periwound Skin Color: Atrophie Blanche: No N/A N/A Cyanosis: No Ecchymosis: No Erythema: No  Hemosiderin Staining: No Mottled: No Pallor: No Rubor: No Temperature: No Abnormality N/A N/A Tenderness on No N/A N/A Palpation: Wound Preparation: Ulcer Cleansing: N/A N/A Rinsed/Irrigated with Saline Topical Anesthetic Applied: Other: lidocaine 4% Treatment Notes Electronic Signature(s) Signed: 02/28/2017 1:54:21 PM By: Christin Fudge MD, FACS Entered By: Christin Fudge on 02/28/2017 13:54:21 Evett, [name] ([id number]) -------------------------------------------------------------------------------- [place] Details Patient Name: [name]. Date of Service: 02/28/2017 1:30 PM Medical Record Number: [id number] Patient Account Number: [id number] Date of Birth/Sex: January 21, 1941 (76 y.o. Male) Treating RN: Montey Hora Primary Care Lea Baine: Tedra Senegal Other Clinician: Referring Christyann Manolis: Tedra Senegal Treating Crislyn Willbanks/Extender: Frann Rider in Treatment: 4 Active Inactive ` Orientation to the Wound Care Program Nursing Diagnoses: Knowledge deficit related to the wound healing center program Goals: Patient/caregiver will verbalize understanding of the [facility] Program Date Initiated: 01/28/2017 Target Resolution Date: 04/19/2017 Goal Status: Active Interventions: Provide education on orientation to the wound center Notes: ` Wound/Skin  Impairment Nursing Diagnoses: Impaired tissue integrity Goals: Patient/caregiver will verbalize understanding of skin care regimen Date Initiated: 01/28/2017 Target Resolution Date: 04/19/2017 Goal Status: Active Ulcer/skin breakdown will have a volume reduction of 30% by week 4 Date Initiated: 01/28/2017 Target Resolution Date: 04/19/2017 Goal Status: Active Ulcer/skin breakdown will have a volume reduction of 50% by week 8 Date Initiated: 01/28/2017 Target Resolution Date: 04/19/2017 Goal Status: Active Ulcer/skin breakdown will have a volume reduction of 80% by week 12 Date Initiated: 01/28/2017 Target Resolution Date: 04/19/2017 Goal Status: Active Ulcer/skin breakdown will heal within 14 weeks Date Initiated: 01/28/2017 Target Resolution Date: 04/19/2017 TATEMarsalis, Beaulieu ([id number]) Goal Status: Active Interventions: Assess patient/caregiver ability to obtain necessary supplies Assess patient/caregiver ability to perform ulcer/skin care regimen upon admission and as needed Assess ulceration(s) every visit Notes: Electronic Signature(s) Signed: 02/28/2017 4:27:13 PM By: Montey Hora Entered By: Montey Hora on 02/28/2017 13:47:38 Donofrio, [name] ([id number]) -------------------------------------------------------------------------------- Pain Assessment Details Patient Name: [name]. Date of Service: 02/28/2017 1:30 PM Medical Record Number: [id number] Patient Account Number: [id number] Date of Birth/Sex: 09-12-1941 (76 y.o. Male) Treating RN: Montey Hora Primary Care Aliza Moret: Tedra Senegal Other Clinician: Referring Deshanae Lindo: Tedra Senegal Treating Willey Due/Extender: Frann Rider in Treatment: 4 Active Problems Location of Pain Severity and Description of Pain Patient Has Paino No Site Locations Pain Management and Medication Current Pain Management: Electronic Signature(s) Signed: 02/28/2017 4:27:13 PM By: Montey Hora Entered By: Montey Hora on  02/28/2017 13:33:31 Irving, [name] ([id number]) -------------------------------------------------------------------------------- Patient/Caregiver Education Details Patient Name: [name]. Date of Service: 02/28/2017 1:30 PM Medical Record Number: [id number] Patient Account Number: [id number] Date of Birth/Gender: 01/28/1941 (76 y.o. Male) Treating RN: Montey Hora Primary Care Physician: Tedra Senegal Other Clinician: Referring Physician: Tedra Senegal Treating Physician/Extender: Frann Rider in Treatment: 4 Education Assessment Education Provided To: Patient Education Topics Provided Wound/Skin Impairment: Handouts: Other: wound care as ordered Methods: Demonstration, Explain/Verbal Responses: State content correctly Electronic Signature(s) Signed: 02/28/2017 4:27:13 PM By: Montey Hora Entered By: Montey Hora on 02/28/2017 13:48:30 Stan, [name] ([id number]) -------------------------------------------------------------------------------- Wound Assessment Details Patient Name: [name]. Date of Service: 02/28/2017 1:30 PM Medical Record Number: [id number] Patient Account Number: [id number] Date of Birth/Sex: Jan 01, 1941 (76 y.o. Male) Treating RN: Montey Hora Primary Care Dmetrius Ambs: Tedra Senegal Other Clinician: Referring Baraka Klatt: Tedra Senegal Treating Nazyia Gaugh/Extender: Frann Rider in Treatment: 4 Wound Status Wound Number: 2 Primary Pressure Ulcer Etiology: Wound Location: Abdomen - midline Wound Open Wounding Event: Gradually Appeared Status: Date Acquired: 01/14/2017 Comorbid Anemia, Hypertension, Peripheral  Weeks Of Treatment: 4 History: Venous Disease, Type II Diabetes, Clustered Wound: No Gout, Neuropathy Photos Photo Uploaded By: Montey Hora on 02/28/2017 13:59:05 Wound Measurements Length: (cm) 2 Width: (cm) 0.4 Depth: (cm) 0.1 Area: (cm) 0.628 Volume: (cm) 0.063 % Reduction in Area: 76.5% % Reduction in  Volume: 76.4% Epithelialization: Small (1-33%) Tunneling: No Undermining: No Wound Description Classification: Category/Stage II Wound Margin: Flat and Intact Exudate Amount: Large Exudate Type: Serous Exudate Color: amber Foul Odor After Cleansing: No Slough/Fibrino Yes Wound Bed Granulation Amount: Large (67-100%) Exposed Structure Granulation Quality: Red Fascia Exposed: No Necrotic Amount: None Present (0%) Fat Layer (Subcutaneous Tissue) Exposed: Yes Tendon Exposed: No [place], [name] ([id number]) Muscle Exposed: No Joint Exposed: No Bone Exposed: No Periwound Skin Texture Texture Color No Abnormalities Noted: No No Abnormalities Noted: No Callus: No Atrophie Blanche: No Crepitus: No Cyanosis: No Excoriation: No Ecchymosis: No Induration: No Erythema: No Rash: No Hemosiderin Staining: No Scarring: No Mottled: No Pallor: No Moisture Rubor: No No Abnormalities Noted: No Dry / Scaly: No Temperature / Pain Maceration: Yes Temperature: No Abnormality Wound Preparation Ulcer Cleansing: Rinsed/Irrigated with Saline Topical Anesthetic Applied: Other: lidocaine 4%, Treatment Notes Wound #2 (Abdomen - midline) 1. Cleansed with: Clean wound with Normal Saline 2. Anesthetic Topical Lidocaine 4% cream to wound bed prior to debridement 4. Dressing Applied: Hydrafera Blue 5. Secondary Emanuel Signature(s) Signed: 02/28/2017 4:27:13 PM By: Montey Hora Entered By: Montey Hora on 02/28/2017 13:39:32 Maisano, [name] ([id number]) -------------------------------------------------------------------------------- [place] Details Patient Name: [name]. Date of Service: 02/28/2017 1:30 PM Medical Record Number: [id number] Patient Account Number: [id number] Date of Birth/Sex: 03-Apr-1941 (76 y.o. Male) Treating RN: Montey Hora Primary Care Carri Spillers: Tedra Senegal Other Clinician: Referring Lanora Reveron: Tedra Senegal Treating  Shakevia Sarris/Extender: Frann Rider in Treatment: 4 Vital Signs Time Taken: 13:33 Temperature (F): 97.8 Height (in): 72 Pulse (bpm): 64 Weight (lbs): 201 Respiratory Rate (breaths/min): 18 Body Mass Index (BMI): 27.3 Blood Pressure (mmHg): 141/95 Reference Range: 80 - 120 mg / dl Electronic Signature(s) Signed: 02/28/2017 4:27:13 PM By: Montey Hora Entered By: Montey Hora on 02/28/2017 13:35:19

## 2017-03-02 NOTE — Progress Notes (Signed)
DENZAL, MEIR ([id number]) Visit Report for 02/28/2017 Chief Complaint Document Details Patient Name: [name], [name]. Date of Service: 02/28/2017 1:30 PM Medical Record Number: [id number] Patient Account Number: [id number] Date of Birth/Sex: 01-26-1941 (77 y.o. Male) Treating RN: Montey Hora Primary Care Provider: Tedra Senegal Other Clinician: Referring Provider: Tedra Senegal Treating Provider/Extender: Frann Rider in Treatment: 4 Information Obtained from: Patient Chief Complaint Patients presents for treatment of an open diabetic ulcer to the periumbilical area which he has had for 2 weeks. he had a similar problem in January of this year and the ulcer healed with offloading and local care Electronic Signature(s) Signed: 02/28/2017 1:54:28 PM By: Christin Fudge MD, FACS Entered By: Christin Fudge on 02/28/2017 13:54:28 [name], [name] ([id number]) -------------------------------------------------------------------------------- HPI Details Patient Name: [name]. Date of Service: 02/28/2017 1:30 PM Medical Record Number: [id number] Patient Account Number: [id number] Date of Birth/Sex: 1940-12-07 (76 y.o. Male) Treating RN: Montey Hora Primary Care Provider: Tedra Senegal Other Clinician: Referring Provider: Tedra Senegal Treating Provider/Extender: Frann Rider in Treatment: 4 History of Present Illness Location: periumbilical area. is on the left of the midline Quality: Patient reports No Pain. Severity: Patient states wound (s) are getting better. Duration: Patient has had the wound for < 2 weeks prior to presenting for treatment Context: The wound appeared gradually over time Modifying Factors: Patient is currently on renal dialysis and receives treatments 3 times weekly Associated Signs and Symptoms: Patient reports having:then local care with hydrogen peroxide and Bactroban ointment HPI Description: 76 year old gentleman seen earlier this year in January  for a abdominal wound is now back with the same problem which has recurred for about 2 weeks and he has been trying to apply some local antibiotic ointment and a Band-Aid there. Of note he has significant pressure in this area due to his beltline and may have put on some weight which results in his trousers being very tight around the waist. The else has changed in his HandP and his diabetes is pretty well controlled. most recent hemoglobin A1c on 12/06/2016 was 7.8%. he was reviewed by his endocrinologist Dr. Dwyane Dee who made appropriate modifications to his treatment plan. he was recently seen by neurology for mild cognitive impairment that was found to be normal for his age ====== Old notes The 76 year old gentleman who has a past medical history of diabetes mellitus, end-stage renal disease on dialysis, hypertension, secondary hyperparathyroidism, peripheral vascular disease and history of previous arterial bifemoral bypass graft in 1992 also has a history of gout, hyperlipidemia, hypothyroidism. last hemoglobin A1c was 7.5%. status post AV fistula placement, breast surgery for left granulomatous mastitis, triple a repair in 1992, small bowel obstruction, upper arm angiograms for AV fistula on the left side. was noted to have a superficial skin wound in the periumbilical area since late December 2017. He was asked to clean it with hydrogen peroxide and apply Bactroban. He was seen by vascular surgery in October 2017 where his right ABI was 0.56 left ABI was 0.87 and right TBI was 0.69 and left TBI was 0.70 ===== Electronic Signature(s) Signed: 02/28/2017 1:54:36 PM By: Christin Fudge MD, FACS Entered By: Christin Fudge on 02/28/2017 13:54:36 [name], [name] ([id number]) -------------------------------------------------------------------------------- Physical Exam Details Patient Name: [name]. Date of Service: 02/28/2017 1:30 PM Medical Record Number: [id number] Patient Account  Number: [id number] Date of Birth/Sex: 1941/01/31 (76 y.o. Male) Treating RN: Montey Hora Primary Care Provider: Tedra Senegal Other Clinician: Referring Provider: Tedra Senegal Treating Provider/Extender: Frann Rider in  Treatment: 4 Constitutional . Pulse regular. Respirations normal and unlabored. Afebrile. . Eyes Nonicteric. Reactive to light. Ears, Nose, Mouth, and Throat Lips, teeth, and gums WNL.Marland Kitchen Moist mucosa without lesions. Neck supple and nontender. No palpable supraclavicular or cervical adenopathy. Normal sized without goiter. Respiratory WNL. No retractions.. Breath sounds WNL, No rubs, rales, rhonchi, or wheeze.. Cardiovascular Heart rhythm and rate regular, no murmur or gallop.. Pedal Pulses WNL. No clubbing, cyanosis or edema. Chest Breasts symmetical and no nipple discharge.. Breast tissue WNL, no masses, lumps, or tenderness.. Lymphatic No adneopathy. No adenopathy. No adenopathy. Musculoskeletal Adexa without tenderness or enlargement.. Digits and nails w/o clubbing, cyanosis, infection, petechiae, ischemia, or inflammatory conditions.. Integumentary (Hair, Skin) No suspicious lesions. No crepitus or fluctuance. No peri-wound warmth or erythema. No masses.Marland Kitchen Psychiatric Judgement and insight Intact.. No evidence of depression, anxiety, or agitation.. Notes the wound is very good and has come down remarkably and is now just a central small area which is still open Electronic Signature(s) Signed: 02/28/2017 1:55:09 PM By: Christin Fudge MD, FACS Entered By: Christin Fudge on 02/28/2017 13:55:09 [name], [name] ([id number]) -------------------------------------------------------------------------------- Physician Orders Details Patient Name: [name]. Date of Service: 02/28/2017 1:30 PM Medical Record Number: [id number] Patient Account Number: [id number] Date of Birth/Sex: 1941-04-01 (76 y.o. Male) Treating RN: Montey Hora Primary Care Provider:  Tedra Senegal Other Clinician: Referring Provider: Tedra Senegal Treating Provider/Extender: Frann Rider in Treatment: 4 Verbal / Phone Orders: No Diagnosis Coding Wound Cleansing Wound #2 Abdomen - midline o Clean wound with Normal Saline. o May Shower, gently pat wound dry prior to applying new dressing. Anesthetic Wound #2 Abdomen - midline o Topical Lidocaine 4% cream applied to wound bed prior to debridement Primary Wound Dressing Wound #2 Abdomen - midline o Hydrafera Blue Secondary Dressing Wound #2 Abdomen - midline o Boardered Foam Dressing - or telfa island Dressing Change Frequency Wound #2 Abdomen - midline o Change dressing every day. Follow-up Appointments Wound #2 Abdomen - midline o Return Appointment in 1 week. Additional Orders / Instructions Wound #2 Abdomen - midline o Increase protein intake. o Other: - TAKE ZINC, VITAMIN A AND VITAMIN C TO SUPPLEMENT AND AID IN WOUND HEALING Electronic Signature(s) Signed: 02/28/2017 3:31:51 PM By: Christin Fudge MD, FACS Signed: 02/28/2017 4:27:13 PM By: Liana Gerold ([id number]) Entered By: Montey Hora on 02/28/2017 13:49:08 [name], [name] ([id number]) -------------------------------------------------------------------------------- Problem List Details Patient Name: [name]. Date of Service: 02/28/2017 1:30 PM Medical Record Number: [id number] Patient Account Number: [id number] Date of Birth/Sex: 26-Jul-1941 (76 y.o. Male) Treating RN: Montey Hora Primary Care Provider: Tedra Senegal Other Clinician: Referring Provider: Tedra Senegal Treating Provider/Extender: Frann Rider in Treatment: 4 Active Problems ICD-10 Encounter Code Description Active Date Diagnosis E11.622 Type 2 diabetes mellitus with other skin ulcer 01/28/2017 Yes S31.102D Unspecified open wound of abdominal wall, epigastric 01/28/2017 Yes region without penetration into peritoneal  cavity, subsequent encounter Z99.2 Dependence on renal dialysis 01/28/2017 Yes Inactive Problems Resolved Problems Electronic Signature(s) Signed: 02/28/2017 1:54:16 PM By: Christin Fudge MD, FACS Entered By: Christin Fudge on 02/28/2017 13:54:16 Tiegs, [name] ([id number]) -------------------------------------------------------------------------------- Progress Note Details Patient Name: [name]. Date of Service: 02/28/2017 1:30 PM Medical Record Number: [id number] Patient Account Number: [id number] Date of Birth/Sex: 04-30-1941 (76 y.o. Male) Treating RN: Montey Hora Primary Care Provider: Tedra Senegal Other Clinician: Referring Provider: Tedra Senegal Treating Provider/Extender: Frann Rider in Treatment: 4 Subjective Chief Complaint Information obtained from Patient Patients presents for treatment of an  open diabetic ulcer to the periumbilical area which he has had for 2 weeks. he had a similar problem in January of this year and the ulcer healed with offloading and local care History of Present Illness (HPI) The following HPI elements were documented for the patient's wound: Location: periumbilical area. is on the left of the midline Quality: Patient reports No Pain. Severity: Patient states wound (s) are getting better. Duration: Patient has had the wound for < 2 weeks prior to presenting for treatment Context: The wound appeared gradually over time Modifying Factors: Patient is currently on renal dialysis and receives treatments 3 times weekly Associated Signs and Symptoms: Patient reports having:then local care with hydrogen peroxide and Bactroban ointment 76 year old gentleman seen earlier this year in January for a abdominal wound is now back with the same problem which has recurred for about 2 weeks and he has been trying to apply some local antibiotic ointment and a Band-Aid there. Of note he has significant pressure in this area due to his beltline and may  have put on some weight which results in his trousers being very tight around the waist. The else has changed in his HandP and his diabetes is pretty well controlled. most recent hemoglobin A1c on 12/06/2016 was 7.8%. he was reviewed by his endocrinologist Dr. Dwyane Dee who made appropriate modifications to his treatment plan. he was recently seen by neurology for mild cognitive impairment that was found to be normal for his age ====== Old notes The 76 year old gentleman who has a past medical history of diabetes mellitus, end-stage renal disease on dialysis, hypertension, secondary hyperparathyroidism, peripheral vascular disease and history of previous arterial bifemoral bypass graft in 1992 also has a history of gout, hyperlipidemia, hypothyroidism. last hemoglobin A1c was 7.5%. status post AV fistula placement, breast surgery for left granulomatous mastitis, triple a repair in 1992, small bowel obstruction, upper arm angiograms for AV fistula on the left side. was noted to have a superficial skin wound in the periumbilical area since late December 2017. He was asked to clean it with hydrogen peroxide and apply Bactroban. He was seen by vascular surgery in October 2017 where his right ABI was 0.56 left ABI was 0.87 and right [name], [name]. ([id number]) TBI was 0.69 and left TBI was 0.70 ===== Objective Constitutional Pulse regular. Respirations normal and unlabored. Afebrile. Vitals Time Taken: 1:33 PM, Height: 72 in, Weight: 201 lbs, BMI: 27.3, Temperature: 97.8 F, Pulse: 64 bpm, Respiratory Rate: 18 breaths/min, Blood Pressure: 141/95 mmHg. Eyes Nonicteric. Reactive to light. Ears, Nose, Mouth, and Throat Lips, teeth, and gums WNL.Marland Kitchen Moist mucosa without lesions. Neck supple and nontender. No palpable supraclavicular or cervical adenopathy. Normal sized without goiter. Respiratory WNL. No retractions.. Breath sounds WNL, No rubs, rales, rhonchi, or wheeze.. Cardiovascular Heart  rhythm and rate regular, no murmur or gallop.. Pedal Pulses WNL. No clubbing, cyanosis or edema. Chest Breasts symmetical and no nipple discharge.. Breast tissue WNL, no masses, lumps, or tenderness.. Lymphatic No adneopathy. No adenopathy. No adenopathy. Musculoskeletal Adexa without tenderness or enlargement.. Digits and nails w/o clubbing, cyanosis, infection, petechiae, ischemia, or inflammatory conditions.Marland Kitchen Psychiatric Judgement and insight Intact.. No evidence of depression, anxiety, or agitation.. General Notes: the wound is very good and has come down remarkably and is now just a central small area which is still open Integumentary (Hair, Skin) [name], [name]. ([id number]) No suspicious lesions. No crepitus or fluctuance. No peri-wound warmth or erythema. No masses.. Wound #2 status is Open. Original cause of wound was  Gradually Appeared. The wound is located on the Abdomen - midline. The wound measures 2cm length x 0.4cm width x 0.1cm depth; 0.628cm^2 area and 0.063cm^3 volume. There is Fat Layer (Subcutaneous Tissue) Exposed exposed. There is no tunneling or undermining noted. There is a large amount of serous drainage noted. The wound margin is flat and intact. There is large (67-100%) red granulation within the wound bed. There is no necrotic tissue within the wound bed. The periwound skin appearance exhibited: Maceration. The periwound skin appearance did not exhibit: Callus, Crepitus, Excoriation, Induration, Rash, Scarring, Dry/Scaly, Atrophie Blanche, Cyanosis, Ecchymosis, Hemosiderin Staining, Mottled, Pallor, Rubor, Erythema. Periwound temperature was noted as No Abnormality. Assessment Active Problems ICD-10 E11.622 - Type 2 diabetes mellitus with other skin ulcer S31.102D - Unspecified open wound of abdominal wall, epigastric region without penetration into peritoneal cavity, subsequent encounter Z99.2 - Dependence on renal dialysis Plan Wound Cleansing: Wound #2  Abdomen - midline: Clean wound with Normal Saline. May Shower, gently pat wound dry prior to applying new dressing. Anesthetic: Wound #2 Abdomen - midline: Topical Lidocaine 4% cream applied to wound bed prior to debridement Primary Wound Dressing: Wound #2 Abdomen - midline: Hydrafera Blue Secondary Dressing: Wound #2 Abdomen - midline: Boardered Foam Dressing - or telfa island Dressing Change Frequency: Wound #2 Abdomen - midline: Change dressing every day. Follow-up Appointments: Wound #2 Abdomen - midline: [name], [name] ([id number]) Return Appointment in 1 week. Additional Orders / Instructions: Wound #2 Abdomen - midline: Increase protein intake. Other: - TAKE ZINC, VITAMIN A AND VITAMIN C TO SUPPLEMENT AND AID IN WOUND HEALING we will continue local care and I have recommended: 1. Hydrofera Blue with a bordered foam, to be changed daily after shower 2. Discussed offloading this area especially due to his baseline being around the scar tissue 3. Regular visits to the wound center. Electronic Signature(s) Signed: 02/28/2017 1:55:49 PM By: Christin Fudge MD, FACS Entered By: Christin Fudge on 02/28/2017 13:55:49 [name], [name] ([id number]) -------------------------------------------------------------------------------- SuperBill Details Patient Name: [name]. Date of Service: 02/28/2017 Medical Record Number: [id number] Patient Account Number: [id number] Date of Birth/Sex: 10/05/1940 (76 y.o. Male) Treating RN: Montey Hora Primary Care Provider: Tedra Senegal Other Clinician: Referring Provider: Tedra Senegal Treating Provider/Extender: Frann Rider in Treatment: 4 Diagnosis Coding ICD-10 Codes Code Description [phone] Type 2 diabetes mellitus with other skin ulcer Unspecified open wound of abdominal wall, epigastric region without penetration into S31.102D peritoneal cavity, subsequent encounter Z99.2 Dependence on renal dialysis Facility  Procedures CPT4 Code: [id number] Description: [phone] - WOUND CARE VISIT-LEV 2 EST PT Modifier: Quantity: 1 Physician Procedures CPT4: Description Modifier Quantity Code [id number] - WC PHYS LEVEL 3 - EST PT 1 ICD-10 Description Diagnosis E11.622 Type 2 diabetes mellitus with other skin ulcer S31.102D Unspecified open wound of abdominal wall, epigastric region without  penetration into peritoneal cavity, subsequent encounter Z99.2 Dependence on renal dialysis Electronic Signature(s) Signed: 02/28/2017 2:00:06 PM By: Montey Hora Signed: 02/28/2017 3:31:51 PM By: Christin Fudge MD, FACS Previous Signature: 02/28/2017 1:56:26 PM Version By: Christin Fudge MD, FACS Entered By: Montey Hora on 02/28/2017 14:00:06

## 2017-03-04 DIAGNOSIS — N2581 Secondary hyperparathyroidism of renal origin: Secondary | ICD-10-CM | POA: Diagnosis not present

## 2017-03-04 DIAGNOSIS — D631 Anemia in chronic kidney disease: Secondary | ICD-10-CM | POA: Diagnosis not present

## 2017-03-04 DIAGNOSIS — N186 End stage renal disease: Secondary | ICD-10-CM | POA: Diagnosis not present

## 2017-03-06 DIAGNOSIS — N186 End stage renal disease: Secondary | ICD-10-CM | POA: Diagnosis not present

## 2017-03-06 DIAGNOSIS — N2581 Secondary hyperparathyroidism of renal origin: Secondary | ICD-10-CM | POA: Diagnosis not present

## 2017-03-06 DIAGNOSIS — D631 Anemia in chronic kidney disease: Secondary | ICD-10-CM | POA: Diagnosis not present

## 2017-03-07 ENCOUNTER — Encounter: Payer: Medicare Other | Admitting: Surgery

## 2017-03-08 ENCOUNTER — Ambulatory Visit: Payer: Medicare Other

## 2017-03-08 DIAGNOSIS — D631 Anemia in chronic kidney disease: Secondary | ICD-10-CM | POA: Diagnosis not present

## 2017-03-08 DIAGNOSIS — N186 End stage renal disease: Secondary | ICD-10-CM | POA: Diagnosis not present

## 2017-03-08 DIAGNOSIS — N2581 Secondary hyperparathyroidism of renal origin: Secondary | ICD-10-CM | POA: Diagnosis not present

## 2017-03-08 DIAGNOSIS — E1165 Type 2 diabetes mellitus with hyperglycemia: Secondary | ICD-10-CM

## 2017-03-08 DIAGNOSIS — Z794 Long term (current) use of insulin: Principal | ICD-10-CM

## 2017-03-08 LAB — HEMOGLOBIN A1C: Hgb A1c MFr Bld: 7.4 % — ABNORMAL HIGH (ref 4.6–6.5)

## 2017-03-08 LAB — GLUCOSE, RANDOM: Glucose, Bld: 229 mg/dL — ABNORMAL HIGH (ref 70–99)

## 2017-03-11 DIAGNOSIS — N2581 Secondary hyperparathyroidism of renal origin: Secondary | ICD-10-CM | POA: Diagnosis not present

## 2017-03-11 DIAGNOSIS — D631 Anemia in chronic kidney disease: Secondary | ICD-10-CM | POA: Diagnosis not present

## 2017-03-11 DIAGNOSIS — N186 End stage renal disease: Secondary | ICD-10-CM | POA: Diagnosis not present

## 2017-03-12 ENCOUNTER — Telehealth: Payer: Self-pay | Admitting: Endocrinology

## 2017-03-12 ENCOUNTER — Ambulatory Visit: Payer: Medicare Other | Admitting: Endocrinology

## 2017-03-12 NOTE — Telephone Encounter (Signed)
Please advise 

## 2017-03-12 NOTE — Telephone Encounter (Signed)
Patient no showed today's appt. Please advise on how to follow up. °A. No follow up necessary. °B. Follow up urgent. Contact patient immediately. °C. Follow up necessary. Contact patient and schedule visit in ___ days. °D. Follow up advised. Contact patient and schedule visit in ____weeks. ° °

## 2017-03-13 ENCOUNTER — Ambulatory Visit (INDEPENDENT_AMBULATORY_CARE_PROVIDER_SITE_OTHER): Payer: Medicare Other | Admitting: Endocrinology

## 2017-03-13 ENCOUNTER — Encounter: Payer: Self-pay | Admitting: Endocrinology

## 2017-03-13 VITALS — BP 142/76 | HR 69 | Ht 71.0 in | Wt 203.4 lb

## 2017-03-13 DIAGNOSIS — Z794 Long term (current) use of insulin: Secondary | ICD-10-CM

## 2017-03-13 DIAGNOSIS — E1165 Type 2 diabetes mellitus with hyperglycemia: Secondary | ICD-10-CM

## 2017-03-13 DIAGNOSIS — N2581 Secondary hyperparathyroidism of renal origin: Secondary | ICD-10-CM | POA: Diagnosis not present

## 2017-03-13 DIAGNOSIS — D631 Anemia in chronic kidney disease: Secondary | ICD-10-CM | POA: Diagnosis not present

## 2017-03-13 DIAGNOSIS — N186 End stage renal disease: Secondary | ICD-10-CM | POA: Diagnosis not present

## 2017-03-13 NOTE — Progress Notes (Signed)
Patient ID: [name], male   DOB: 10-21-1940, 76 y.o.   MRN: [id number]           Reason for Appointment:  Follow-up for Type 2 Diabetes  Referring physician: [place]  History of Present Illness:          Date of diagnosis of type 2 diabetes mellitus:  1996      Background history:  [name] has had long-standing diabetes probably treated with metformin initially and subsequently with sulfonylurea drugs At some point [name] was also given Actos in addition to his glipizide which [name] is still taking Records of his control are only available for the last 4 years Over the last few years his A1c has been generally in the 6-7% range His A1c was 6.2 in May 2016 and apparently was starting to get low normal blood sugars at times [name] was admitted to the hospital for fluid overload and renal failure and at that time his Actos was stopped Subsequently his blood sugars had been markedly increased [name] was switched from regular insulin to NovoLog on his initial consultation  Recent history:    His A1c is slightly better at 7.4 compared to previous level of 7.8  INSULIN regimen is described as: Lantus 13 units in am; 8--5--8  units Novolog with meals with syringe  Dialysis is done at 12 noon, doing it on Monday/Wednesday/Fridays     Current blood sugar patterns and problems identified:  [name] did  bring his monitor for download  [name] appears to be checking blood sugars irregularly and mostly at various times in the mornings, not clear which readings are postprandial  FASTING blood sugars are overall mildly increased and higher in the last week or so  [name] is on his own has increased his Lantus by 3 units a couple of days ago, fasting reading was 140 today  However [name] is checking blood sugar very sporadically later in the day  [name] was told by his [facility] doctor not to take Actos but because of higher readings [name] has started taking this back in the last few days, glucose was 138 last night  His  glucose monitor has incorrect date programmed  [name] still has difficulty getting coverage for his snacking while [name] is at dialysis when [name] does not eat a proper meal; [name] again has a light breakfast but since [name] is going to dialysis at noon [name] will not eat a proper lunch  More recently has been taking 5 units of NovoLog right before going for dialysis  Despite taking 8 units in the morning [name] does not think [name] feels hypoglycemic after breakfast  Overall highest reading that bedtime  Oral hypoglycemic drugs the patient is taking are:  on Actos 30 mg daily in a.m.     Side effects from medications have been: None  Compliance with the medical regimen: Good   Glucose monitoring:  done 0-2  times a day         Glucometer:  Freestyle  Blood Glucose readings by download  Mean values apply above for all meters except median for One Touch  PRE-MEAL Fasting Lunch Dinner Bedtime Overall  Glucose range: 128-141  164  167  1 38-229    Mean/median:    200  165   POST-MEAL PC Breakfast PC Lunch PC Dinner  Glucose range: 148-180     Mean/median:       Self-care: The diet that the patient has been following is: tries to limit  sweets, portions. eating out only about once a week at a steak house        Typical meal intake: Breakfast is  Protein shake or bagel; supper 8 pm   Lunch is irregular on nondialysis days but has a Larger lunch on Sundays             Dietician visit, most recent: ?               Exercise: some walking daily  Weight history:  Wt Readings from Last 3 Encounters:  03/13/17 203 lb 6.4 oz (92.3 kg)  01/29/17 196 lb (88.9 kg)  01/24/17 200 lb 9.6 oz (91 kg)    Glycemic control:    Lab Results  Component Value Date   HGBA1C 7.4 (H) 03/08/2017   HGBA1C 7.8 (H) 12/06/2016   HGBA1C 7.9 (H) 09/03/2016   Lab Results  Component Value Date   MICROALBUR 37.2 09/03/2016   LDLCALC 31 09/03/2016   CREATININE 9.18 (H) 09/03/2016    Lab Results  Component Value Date    FRUCTOSAMINE 355 (H) 10/08/2016   FRUCTOSAMINE 407 (H) 08/29/2016   FRUCTOSAMINE 357 (H) 03/01/2016      Allergies as of 03/13/2017      Reactions   Penicillins Rash   Has patient had a PCN reaction causing immediate rash, facial/tongue/throat swelling, SOB or lightheadedness with hypotension: Yes Has patient had a PCN reaction causing severe rash involving mucus membranes or skin necrosis: Yes Has patient had a PCN reaction that required hospitalization: No Has patient had a PCN reaction occurring within the last 10 years: No If all of the above answers are "NO", then may proceed with Cephalosporin use.      Medication List       Accurate as of 03/13/17 11:59 PM. Always use your most recent med list.          acetaminophen 500 MG tablet Commonly known as:  TYLENOL Take 500 mg by mouth every 6 (six) hours as needed (for pain).   acetaminophen-codeine 300-30 MG tablet Commonly known as:  TYLENOL #3 take 1 tablet by mouth every 4 hours if needed for pain   allopurinol 100 MG tablet Commonly known as:  ZYLOPRIM Take 100 mg by mouth daily.   aspirin 325 MG tablet Take 650 mg by mouth daily.   azithromycin 250 MG tablet Commonly known as:  ZITHROMAX Z-PAK One po daily x 5 days   clobetasol 0.05 % external solution Commonly known as:  TEMOVATE apply to affected area ON THE SCALP UP TO 2 TIMES A DAY AS NEEDED...  (REFER TO PRESCRIPTION NOTES).   donepezil 10 MG tablet Commonly known as:  ARICEPT Take 1 tablet (10 mg total) by mouth at bedtime.   Fluocinolone Acetonide Scalp 0.01 % Oil apply to affected area ON SCALP EVERY NIGHT AT BEDTIME, WASH UPON AWAKENING   fluticasone 0.05 % cream Commonly known as:  CUTIVATE apply to affected area ON FACE UP TO 2 TIMES A DAY AS NEEDED   fluticasone 50 MCG/ACT nasal spray Commonly known as:  FLONASE Place 1 spray into both nostrils daily.   FREESTYLE FREEDOM LITE w/Device Kit Use to check blood sugar 2 times per day dx code  E11.65   freestyle lancets Use as instructed to check blood sugar 2 times per day dx code E11.65   glucose blood test strip Commonly known as:  ONETOUCH VERIO Use to check blood sugar 2 times per day. Dx code E11.65  insulin aspart 100 UNIT/ML injection Commonly known as:  novoLOG Inject 10 Units into the skin 3 (three) times daily before meals.   insulin glargine 100 UNIT/ML injection Commonly known as:  LANTUS Inject 0.08 mLs (8 Units total) into the skin every morning.   ketoconazole 2 % shampoo Commonly known as:  NIZORAL Apply 1 application topically 2 (two) times a week.   levothyroxine 50 MCG tablet Commonly known as:  SYNTHROID, LEVOTHROID Take 1 tablet (50 mcg total) by mouth daily.   lidocaine-prilocaine cream Commonly known as:  EMLA Apply 1 application topically once. AS DIRECTED   loratadine 10 MG tablet Commonly known as:  CLARITIN Take 10 mg by mouth daily as needed for allergies.   metroNIDAZOLE 0.75 % gel Commonly known as:  METROGEL APPLY TO AFFECTED AREA ON SKIN  ON FACE TWICE DAILY   mupirocin ointment 2 % Commonly known as:  BACTROBAN Use as directed to affected areas   Naftifine HCl 2 % Crea Commonly known as:  NAFTIN Apply to feet daily for fungal infection   pioglitazone 30 MG tablet Commonly known as:  ACTOS take 1 tablet by mouth once daily   RENVELA 800 MG tablet Generic drug:  sevelamer carbonate Take 800-2,400 mg by mouth See admin instructions. 1,600-2,400 mg with breakfast then 1,600 mg with lunch then 2,400 mg with dinner (evening meal) and 800 mg with each snack   SENSIPAR 30 MG tablet Generic drug:  cinacalcet Take 30 mg by mouth daily.   simvastatin 20 MG tablet Commonly known as:  ZOCOR Take 20 mg by mouth at bedtime.   triamcinolone ointment 0.1 % Commonly known as:  KENALOG Apply 1 application topically 2 (two) times daily. Use between thighs       Allergies:  Allergies  Allergen Reactions  . Penicillins  Rash    Has patient had a PCN reaction causing immediate rash, facial/tongue/throat swelling, SOB or lightheadedness with hypotension: Yes Has patient had a PCN reaction causing severe rash involving mucus membranes or skin necrosis: Yes Has patient had a PCN reaction that required hospitalization: No Has patient had a PCN reaction occurring within the last 10 years: No If all of the above answers are "NO", then may proceed with Cephalosporin use.     Past Medical History:  Diagnosis Date  . Allergy   . Anemia   . Arthritis   . Coronary artery disease   . Diabetes mellitus    Type 2  . Diverticulitis   . ED (erectile dysfunction)   . 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 ESRD (end stage renal disease) on dialysis ([place]) 03/2015  . GERD (gastroesophageal reflux disease)    pepto   . Gout   . Hyperlipidemia   . Hypertension   . Hypothyroidism   . Pneumonia   . PVD (peripheral vascular disease) ([place])    has plastic aorta  . Renal insufficiency   . Seasonal allergies   . Shortness of breath dyspnea   . Sleep apnea   . Thyroid disease     Past Surgical History:  Procedure Laterality Date  . aortobifemoral bypass    . AV FISTULA PLACEMENT Left 12/01/2013   Procedure: ARTERIOVENOUS (AV) FISTULA CREATION- LEFT BRACHIOCEPHALIC;  Surgeon: Angelia Mould, MD;  Location: [place];  Service: Vascular;  Laterality: Left;  . [place] TRANSPOSITION Right 07/27/2014   Procedure: BASCILIC VEIN TRANSPOSITION;  Surgeon: Angelia Mould, MD;  Location: [place];  Service: Vascular;  Laterality: Right;  .  BREAST SURGERY     left - granulomatous mastitis  . COLONOSCOPY    . 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 EYE SURGERY Bilateral    cataracts  . REVISON OF ARTERIOVENOUS FISTULA Left 02/09/2014   Procedure: REVISON OF LEFT ARTERIOVENOUS FISTULA - RESECTION OF RENDUNDANT VEIN;  Surgeon: Angelia Mould, MD;  Location: [place];  Service: Vascular;  Laterality: Left;    . SBO with lysis adhesions    . SHUNTOGRAM Left 04/19/2014   Procedure: FISTULOGRAM;  Surgeon: Angelia Mould, MD;  Location: [facility] C[facility] LAB;  Service: Cardiovascular;  Laterality: Left;  . UNILATERAL UPPER EXTREMEITY ANGIOGRAM N/A 07/12/2014   Procedure: UNILATERAL UPPER Anselmo Rod;  Surgeon: Angelia Mould, MD;  Location: [facility] CATH LAB;  Service: Cardiovascular;  Laterality: N/A;    Family History  Problem Relation Age of Onset  . Aneurysm Mother   . Heart disease Father   . Stroke Father   . Hypertension Father   . Diabetes Father   . Dementia Neg Hx     Social History:  reports that [name] quit smoking about 22 years ago. [name] has never used smokeless tobacco. [name] reports that [name] does not drink alcohol or use drugs.    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 Hgb A1c MFr Bld 03/08/2017 7.4* 4.6 - 6.5 % Final   Glycemic Control Guidelines for People with Diabetes:Non Diabetic:  <6%Goal of Therapy: <7%Additional Action Suggested:  >8%   . Glucose, Bld 03/08/2017 229* 70 - 99 mg/dL Final    Physical Examination:  BP (!) 142/76   Pulse 69   Ht 5' 11"  (1.803 m)   Wt 203 lb 6.4 oz (92.3 kg)   SpO2 98%   BMI 28.37 kg/m       ASSESSMENT:  Diabetes type 2, uncontrolled See history of present illness for detailed discussion of his current management, blood sugar patterns and problems identified  His A1c is 7.4 Difficult to get a good idea of his blood sugar patterns as [name] is checking  blood sugars sporadically and not clear which readings are after meals His monitor has incorrect date and time  His highest readings are probably after supper but has only a couple of readings recently and difficult to identify which readings are at night with the incorrect time [name] has recently increased his Lantus by 3 units and restarted Actos and not clear this would continue to benefit his control Again discussed the difficulty with his trying to get proper coverage for his snacks while at dialysis when [name] is not eating properly lunch Since his sugars tend to rise in the morning [name] can continue to take 8 units of NovoLog at breakfast   PLAN:  Discussed needing to adjust his suppertime dose further if his blood sugars are higher after supper consistently No  change in Lantus unless fasting readings are getting out of range, [name] just made the change recently Discussed blood sugar targets at various times [name] is not checking blood sugars after [name] comes back from dialysis and need to do this also Day-to-day management of insulin, diet, mealtime shots, dosage and timing, exercise and weight loss were all discussed in detail  Patient Instructions  Check blood sugars on waking up  3-4/7  Also check more blood sugars about 2-3 hours after a meal and do this after different meals by rotation  Recommended blood sugar levels on waking up is 90-130 and about 2 hours after meal is 130-160  Please bring your blood sugar monitor to each visit, thank you         Counseling time on subjects discussed above is over 50% of today's 25 minute visit       Chelcea Zahn 03/14/2017, 9:14 PM   Note: This office note was prepared with Dragon voice recognition system technology. Any transcriptional errors that result from this process are unintentional.

## 2017-03-13 NOTE — Patient Instructions (Signed)
Check blood sugars on waking up  3-4/7  Also check more blood sugars about 2-3 hours after a meal and do this after different meals by rotation  Recommended blood sugar levels on waking up is 90-130 and about 2 hours after meal is 130-160  Please bring your blood sugar monitor to each visit, thank you

## 2017-03-14 ENCOUNTER — Encounter: Payer: Medicare Other | Admitting: Physician Assistant

## 2017-03-14 DIAGNOSIS — E11622 Type 2 diabetes mellitus with other skin ulcer: Secondary | ICD-10-CM | POA: Diagnosis not present

## 2017-03-14 DIAGNOSIS — I120 Hypertensive chronic kidney disease with stage 5 chronic kidney disease or end stage renal disease: Secondary | ICD-10-CM | POA: Diagnosis not present

## 2017-03-14 DIAGNOSIS — Z992 Dependence on renal dialysis: Secondary | ICD-10-CM | POA: Diagnosis not present

## 2017-03-14 DIAGNOSIS — S31102D Unspecified open wound of abdominal wall, epigastric region without penetration into peritoneal cavity, subsequent encounter: Secondary | ICD-10-CM | POA: Diagnosis not present

## 2017-03-14 DIAGNOSIS — N186 End stage renal disease: Secondary | ICD-10-CM | POA: Diagnosis not present

## 2017-03-14 DIAGNOSIS — S31109A Unspecified open wound of abdominal wall, unspecified quadrant without penetration into peritoneal cavity, initial encounter: Secondary | ICD-10-CM | POA: Diagnosis not present

## 2017-03-14 DIAGNOSIS — E1122 Type 2 diabetes mellitus with diabetic chronic kidney disease: Secondary | ICD-10-CM | POA: Diagnosis not present

## 2017-03-15 DIAGNOSIS — N186 End stage renal disease: Secondary | ICD-10-CM | POA: Diagnosis not present

## 2017-03-15 DIAGNOSIS — N2581 Secondary hyperparathyroidism of renal origin: Secondary | ICD-10-CM | POA: Diagnosis not present

## 2017-03-15 DIAGNOSIS — D631 Anemia in chronic kidney disease: Secondary | ICD-10-CM | POA: Diagnosis not present

## 2017-03-18 NOTE — Progress Notes (Signed)
[name], [name] ([id number]) Visit Report for 03/14/2017 Arrival Information Details Patient Name: [name]. Date of Service: 03/14/2017 3:30 PM Medical Record Number: [id number] Patient Account Number: [id number] Date of Birth/Sex: May 25, 1941 (76 y.o. Male) Treating RN: Montey Hora Primary Care Alizae Bechtel: Tedra Senegal Other Clinician: Referring Zyshawn Bohnenkamp: Tedra Senegal Treating Junnie Loschiavo/Extender: [name], HOYT Weeks in Treatment: 6 Visit Information History Since Last Visit Added or deleted any medications: No Patient Arrived: Ambulatory Any new allergies or adverse reactions: No Arrival Time: 16:05 Had a fall or experienced change in No Accompanied By: self activities of daily living that may affect Transfer Assistance: None risk of falls: Patient Identification Verified: Yes Signs or symptoms of abuse/neglect since last No Secondary Verification Process Yes visito Completed: Hospitalized since last visit: No Patient Has Alerts: Yes Has Dressing in Place as Prescribed: Yes Patient Alerts: Patient on Blood Pain Present Now: No Thinner DMII aspirin 650mg  Electronic Signature(s) Signed: 03/14/2017 5:06:57 PM By: Montey Hora Entered By: Montey Hora on 03/14/2017 16:05:43 [name], [name] ([id number]) -------------------------------------------------------------------------------- Clinic Level of Care Assessment Details Patient Name: [name]. Date of Service: 03/14/2017 3:30 PM Medical Record Number: [id number] Patient Account Number: [id number] Date of Birth/Sex: 1941/09/18 (76 y.o. Male) Treating RN: Montey Hora Primary Care Handy Mcloud: Tedra Senegal Other Clinician: Referring Alyssamae Klinck: Tedra Senegal Treating Palmira Stickle/Extender: [name], HOYT Weeks in Treatment: 6 Clinic Level of Care Assessment Items TOOL 4 Quantity Score []  - Use when only an EandM is performed on FOLLOW-UP visit 0 ASSESSMENTS - Nursing Assessment / Reassessment X - Reassessment  of Co-morbidities (includes updates in patient status) 1 10 X - Reassessment of Adherence to Treatment Plan 1 5 ASSESSMENTS - Wound and Skin Assessment / Reassessment X - Simple Wound Assessment / Reassessment - one wound 1 5 []  - Complex Wound Assessment / Reassessment - multiple wounds 0 []  - Dermatologic / Skin Assessment (not related to wound area) 0 ASSESSMENTS - Focused Assessment []  - Circumferential Edema Measurements - multi extremities 0 []  - Nutritional Assessment / Counseling / Intervention 0 []  - Lower Extremity Assessment (monofilament, tuning fork, pulses) 0 []  - Peripheral Arterial Disease Assessment (using hand held doppler) 0 ASSESSMENTS - Ostomy and/or Continence Assessment and Care []  - Incontinence Assessment and Management 0 []  - Ostomy Care Assessment and Management (repouching, etc.) 0 PROCESS - Coordination of Care X - Simple Patient / Family Education for ongoing care 1 15 []  - Complex (extensive) Patient / Family Education for ongoing care 0 []  - Staff obtains Programmer, systems, Records, Test Results / Process Orders 0 []  - Staff telephones HHA, Nursing Homes / Clarify orders / etc 0 []  - Routine Transfer to another Facility (non-emergent condition) 0 [name], [name] ([id number]) []  - Routine Hospital Admission (non-emergent condition) 0 []  - New Admissions / Biomedical engineer / Ordering NPWT, Apligraf, etc. 0 []  - Emergency Hospital Admission (emergent condition) 0 X - Simple Discharge Coordination 1 10 []  - Complex (extensive) Discharge Coordination 0 PROCESS - Special Needs []  - Pediatric / Minor Patient Management 0 []  - Isolation Patient Management 0 []  - Hearing / Language / Visual special needs 0 []  - Assessment of Community assistance (transportation, D/C planning, etc.) 0 []  - Additional assistance / Altered mentation 0 []  - Support Surface(s) Assessment (bed, cushion, seat, etc.) 0 INTERVENTIONS - Wound Cleansing / Measurement X - Simple Wound  Cleansing - one wound 1 5 []  - Complex Wound Cleansing - multiple wounds 0 X - Wound Imaging (photographs - any number of  wounds) 1 5 []  - Wound Tracing (instead of photographs) 0 X - Simple Wound Measurement - one wound 1 5 []  - Complex Wound Measurement - multiple wounds 0 INTERVENTIONS - Wound Dressings X - Small Wound Dressing one or multiple wounds 1 10 []  - Medium Wound Dressing one or multiple wounds 0 []  - Large Wound Dressing one or multiple wounds 0 []  - Application of Medications - topical 0 []  - Application of Medications - injection 0 INTERVENTIONS - Miscellaneous []  - External ear exam 0 [name], [name] []  - Specimen Collection (cultures, biopsies, blood, body fluids, etc.) 0 []  - Specimen(s) / Culture(s) sent or taken to Lab for analysis 0 []  - Patient Transfer (multiple staff / Harrel Lemon Lift / Similar devices) 0 []  - Simple Staple / Suture removal (25 or less) 0 []  - Complex Staple / Suture removal (26 or more) 0 []  - Hypo / Hyperglycemic Management (close monitor of Blood Glucose) 0 []  - Ankle / Brachial Index (ABI) - do not check if billed separately 0 X - Vital Signs 1 5 Has the patient been seen at the hospital within the last three years: Yes Total Score: 75 Level Of Care: New/Established - Level 2 Electronic Signature(s) Signed: 03/14/2017 5:06:57 PM By: Montey Hora Entered By: Montey Hora on 03/14/2017 17:06:12 [name], [name] -------------------------------------------------------------------------------- Encounter Discharge Information Details Patient Name: [name]. Date of Service: 03/14/2017 3:30 PM Medical Record Number: [id number] Patient Account Number: [id number] Date of Birth/Sex: 12/25/1940 (76 y.o. Male) Treating RN: Montey Hora Primary Care Ariadne Rissmiller: Tedra Senegal Other Clinician: Referring Courtnee Myer: Tedra Senegal Treating Khrystyna Schwalm/Extender: [name], HOYT Weeks in Treatment: 6 Encounter Discharge  Information Items Discharge Pain Level: 0 Discharge Condition: Stable Ambulatory Status: Ambulatory Discharge Destination: Home Transportation: Private Auto Accompanied By: self Schedule Follow-up Appointment: Yes Medication Reconciliation completed and provided to Patient/Care No Chayson Charters: Provided on Clinical Summary of Care: 03/14/2017 Form Type Recipient Paper Patient WT Electronic Signature(s) Signed: 03/14/2017 4:34:54 PM By: Ruthine Dose Previous Signature: 03/14/2017 4:16:16 PM Version By: Montey Hora Entered By: Ruthine Dose on 03/14/2017 16:34:54 Belcher, [name] ([id number]) -------------------------------------------------------------------------------- Multi Wound Chart Details Patient Name: [name]. Date of Service: 03/14/2017 3:30 PM Medical Record Number: [id number] Patient Account Number: [id number] Date of Birth/Sex: [birth year] (76 y.o. Male) Treating RN: Montey Hora Primary Care Maddisyn Hegwood: Tedra Senegal Other Clinician: Referring Kimori Tartaglia: Tedra Senegal Treating Amberley Hamler/Extender: [name], HOYT Weeks in Treatment: 6 Vital Signs Height(in): 72 Pulse(bpm): 68 Weight(lbs): 201 Blood Pressure 126/42 (mmHg): Body Mass Index(BMI): 27 Temperature(F): 97.7 Respiratory Rate 18 (breaths/min): Photos: [N/A:N/A] Wound Location: Abdomen - midline N/A N/A Wounding Event: Gradually Appeared N/A N/A Primary Etiology: Pressure Ulcer N/A N/A Date Acquired: 01/14/2017 N/A N/A Weeks of Treatment: 6 N/A N/A Wound Status: Open N/A N/A Measurements L x W x D 0.1x0.1x0.1 N/A N/A (cm) Area (cm) : 0.008 N/A N/A Volume (cm) : 0.001 N/A N/A % Reduction in Area: 99.70% N/A N/A % Reduction in Volume: 99.60% N/A N/A Classification: Category/Stage II N/A N/A Periwound Skin Texture: No Abnormalities Noted N/A N/A Periwound Skin No Abnormalities Noted N/A N/A Moisture: Periwound Skin Color: No Abnormalities Noted N/A N/A Tenderness on No N/A  N/A Palpation: Treatment Notes Lie, NIRAV SWEDA ([id number]) Electronic Signature(s) Signed: 03/14/2017 4:16:00 PM By: Montey Hora Entered By: Montey Hora on 03/14/2017 16:16:00 Burrough, [name] ([id number]) -------------------------------------------------------------------------------- Multi-Disciplinary Care Plan Details Patient Name: [name]. Date of Service: 03/14/2017 3:30 PM Medical Record Number: [id number] Patient Account Number: [id number]  Date of Birth/Sex: [birth year] (76 y.o. Male) Treating RN: Montey Hora Primary Care Latissa Frick: Tedra Senegal Other Clinician: Referring Dekendrick Uzelac: Tedra Senegal Treating Alazae Crymes/Extender: [name], HOYT Weeks in Treatment: 6 Active Inactive ` Orientation to the Wound Care Program Nursing Diagnoses: Knowledge deficit related to the wound healing center program Goals: Patient/caregiver will verbalize understanding of the [place] Program Date Initiated: 01/28/2017 Target Resolution Date: 04/19/2017 Goal Status: Active Interventions: Provide education on orientation to the wound center Notes: ` Wound/Skin Impairment Nursing Diagnoses: Impaired tissue integrity Goals: Patient/caregiver will verbalize understanding of skin care regimen Date Initiated: 01/28/2017 Target Resolution Date: 04/19/2017 Goal Status: Active Ulcer/skin breakdown will have a volume reduction of 30% by week 4 Date Initiated: 01/28/2017 Target Resolution Date: 04/19/2017 Goal Status: Active Ulcer/skin breakdown will have a volume reduction of 50% by week 8 Date Initiated: 01/28/2017 Target Resolution Date: 04/19/2017 Goal Status: Active Ulcer/skin breakdown will have a volume reduction of 80% by week 12 Date Initiated: 01/28/2017 Target Resolution Date: 04/19/2017 Goal Status: Active Ulcer/skin breakdown will heal within 14 weeks Date Initiated: 01/28/2017 Target Resolution Date: 04/19/2017 TATELeonardo, Makris ([id number]) Goal Status:  Active Interventions: Assess patient/caregiver ability to obtain necessary supplies Assess patient/caregiver ability to perform ulcer/skin care regimen upon admission and as needed Assess ulceration(s) every visit Notes: Electronic Signature(s) Signed: 03/14/2017 4:15:53 PM By: Montey Hora Entered By: Montey Hora on 03/14/2017 16:15:52 Cure, [name] ([id number]) -------------------------------------------------------------------------------- Pain Assessment Details Patient Name: [name]. Date of Service: 03/14/2017 3:30 PM Medical Record Number: [id number] Patient Account Number: [id number] Date of Birth/Sex: 21-Sep-1941 (76 y.o. Male) Treating RN: Montey Hora Primary Care Kizer Nobbe: Tedra Senegal Other Clinician: Referring Sparkles Mcneely: Tedra Senegal Treating Anuradha Chabot/Extender: [name], HOYT Weeks in Treatment: 6 Active Problems Location of Pain Severity and Description of Pain Patient Has Paino No Site Locations Pain Management and Medication Current Pain Management: Electronic Signature(s) Signed: 03/14/2017 5:06:57 PM By: Montey Hora Entered By: Montey Hora on 03/14/2017 16:05:54 Elms, [name] ([id number]) -------------------------------------------------------------------------------- Patient/Caregiver Education Details Patient Name: [name]. Date of Service: 03/14/2017 3:30 PM Medical Record Number: [id number] Patient Account Number: [id number] Date of Birth/Gender: 10-22-1940 (76 y.o. Male) Treating RN: Montey Hora Primary Care Physician: Tedra Senegal Other Clinician: Referring Physician: Tedra Senegal Treating Physician/Extender: Sharalyn Ink in Treatment: 6 Education Assessment Education Provided To: Patient Education Topics Provided Wound/Skin Impairment: Handouts: Other: wound care as ordered Methods: Demonstration, Explain/Verbal Responses: State content correctly Electronic Signature(s) Signed: 03/14/2017 5:06:57 PM By:  Montey Hora Entered By: Montey Hora on 03/14/2017 16:16:33 Bumgardner, [name] ([id number]) -------------------------------------------------------------------------------- Wound Assessment Details Patient Name: [name]. Date of Service: 03/14/2017 3:30 PM Medical Record Number: [id number] Patient Account Number: [id number] Date of Birth/Sex: 07/25/1941 (76 y.o. Male) Treating RN: Montey Hora Primary Care Heavan Francom: Tedra Senegal Other Clinician: Referring Mariel Lukins: Tedra Senegal Treating Lucrecia Mcphearson/Extender: [name], HOYT Weeks in Treatment: 6 Wound Status Wound Number: 2 Primary Etiology: Pressure Ulcer Wound Location: Abdomen - midline Wound Status: Open Wounding Event: Gradually Appeared Date Acquired: 01/14/2017 Weeks Of Treatment: 6 Clustered Wound: No Wound Measurements Length: (cm) 0.1 Width: (cm) 0.1 Depth: (cm) 0.1 Area: (cm) 0.008 Volume: (cm) 0.001 % Reduction in Area: 99.7% % Reduction in Volume: 99.6% Wound Description Classification: Category/Stage II Periwound Skin Texture Texture Color No Abnormalities Noted: No No Abnormalities Noted: No Moisture No Abnormalities Noted: No Treatment Notes Wound #2 (Abdomen - midline) 1. Cleansed with: Clean wound with Normal Saline 2. Anesthetic Topical Lidocaine 4% cream to wound bed prior to  debridement 4. Dressing Applied: Foam 5. Secondary [place] Signature(s) Signed: 03/14/2017 5:06:57 PM By: Montey Hora Entered By: Montey Hora on 03/14/2017 17:05:50 Caddell, [name] ([id number]) Mihok, [name] ([id number]) -------------------------------------------------------------------------------- [place] Details Patient Name: [name]. Date of Service: 03/14/2017 3:30 PM Medical Record Number: [id number] Patient Account Number: [id number] Date of Birth/Sex: Jan 30, 1941 (76 y.o. Male) Treating RN: Montey Hora Primary Care Jarell Mcewen: Tedra Senegal Other  Clinician: Referring Lulu Hirschmann: Tedra Senegal Treating Darius Lundberg/Extender: [name], HOYT Weeks in Treatment: 6 Vital Signs Time Taken: 16:05 Temperature (F): 97.7 Height (in): 72 Pulse (bpm): 68 Weight (lbs): 201 Respiratory Rate (breaths/min): 18 Body Mass Index (BMI): 27.3 Blood Pressure (mmHg): 126/42 Reference Range: 80 - 120 mg / dl Electronic Signature(s) Signed: 03/14/2017 5:06:57 PM By: Montey Hora Entered By: Montey Hora on 03/14/2017 16:08:06

## 2017-03-18 NOTE — Progress Notes (Signed)
[name], Johnny ([id number]) Visit Report for 03/14/2017 Chief Complaint Document Details Patient Name: [name], [name]. Date of Service: 03/14/2017 3:30 PM Medical Record Number: [id number] Patient Account Number: [id number] Date of Birth/Sex: 1940/11/20 (76 y.o. Male) Treating RN: Montey Hora Primary Care Provider: Tedra Senegal Other Clinician: Referring Provider: Tedra Senegal Treating Provider/Extender: [name], HOYT Weeks in Treatment: 6 Information Obtained from: Patient Chief Complaint Patients presents for treatment of an open diabetic ulcer to the periumbilical area which he has had for 2 weeks. he had a similar problem in January of this year and the ulcer healed with offloading and local care Electronic Signature(s) Signed: 03/14/2017 5:49:59 PM By: Worthy Keeler PA-C Entered By: Worthy Keeler on 03/14/2017 17:00:09 [name], [name] ([id number]) -------------------------------------------------------------------------------- HPI Details Patient Name: [name]. Date of Service: 03/14/2017 3:30 PM Medical Record Number: [id number] Patient Account Number: [id number] Date of Birth/Sex: [birth year] (76 y.o. Male) Treating RN: Montey Hora Primary Care Provider: Tedra Senegal Other Clinician: Referring Provider: Tedra Senegal Treating Provider/Extender: [name], HOYT Weeks in Treatment: 6 History of Present Illness Location: periumbilical area. is on the left of the midline Quality: Patient reports No Pain. Severity: Patient states wound (s) are getting better. Duration: Patient has had the wound for < 2 weeks prior to presenting for treatment Context: The wound appeared gradually over time Modifying Factors: Patient is currently on renal dialysis and receives treatments 3 times weekly Associated Signs and Symptoms: Patient reports having:then local care with hydrogen peroxide and Bactroban ointment HPI Description: 76 year old gentleman seen earlier this year in  January for a abdominal wound is now back with the same problem which has recurred for about 2 weeks and he has been trying to apply some local antibiotic ointment and a Band-Aid there. Of note he has significant pressure in this area due to his beltline and may have put on some weight which results in his trousers being very tight around the waist. The else has changed in his HandP and his diabetes is pretty well controlled. most recent hemoglobin A1c on 12/06/2016 was 7.8%. he was reviewed by his endocrinologist Dr. Dwyane Dee who made appropriate modifications to his treatment plan. he was recently seen by neurology for mild cognitive impairment that was found to be normal for his age 32/21/18 patient appears to be doing well in regard to his abdominal wound area on evaluation today. He has been tolerating the dressing changes with the [facility] Dressing I'm pleased with how this has progressed and in fact the wound appears to be completely healed on evaluation today. There is definitely no evidence of infection. ====== Old notes The 76 year old gentleman who has a past medical history of diabetes mellitus, end-stage renal disease on dialysis, hypertension, secondary hyperparathyroidism, peripheral vascular disease and history of previous arterial bifemoral bypass graft in 1992 also has a history of gout, hyperlipidemia, hypothyroidism. last hemoglobin A1c was 7.5%. status post AV fistula placement, breast surgery for left granulomatous mastitis, triple a repair in 1992, small bowel obstruction, upper arm angiograms for AV fistula on the left side. was noted to have a superficial skin wound in the periumbilical area since late December 2017. He was asked to clean it with hydrogen peroxide and apply Bactroban. He was seen by vascular surgery in October 2017 where his right ABI was 0.56 left ABI was 0.87 and right TBI was 0.69 and left TBI was 0.70 ===== Electronic Signature(s) Signed:  03/14/2017 5:49:59 PM By: Worthy Keeler PA-C [name], [name]. (  [id number]) Entered By: Worthy Keeler on 03/14/2017 17:01:37 [name], [name] ([id number]) -------------------------------------------------------------------------------- Physical Exam Details Patient Name: Johnny, [name] Date of Service: 03/14/2017 3:30 PM Medical Record Number: [id number] Patient Account Number: [id number] Date of Birth/Sex: [birth year] (76 y.o. Male) Treating RN: Montey Hora Primary Care Provider: Tedra Senegal Other Clinician: Referring Provider: Tedra Senegal Treating Provider/Extender: [name], HOYT Weeks in Treatment: 6 Constitutional Well-nourished and well-hydrated in no acute distress. Respiratory normal breathing without difficulty. clear to auscultation bilaterally. Cardiovascular regular rate and rhythm with normal S1, S2. Psychiatric this patient is able to make decisions and demonstrates good insight into disease process. Alert and Oriented x 3. pleasant and cooperative. Notes There is a little bit of old appearing blood collected in the left inferior wound locations although this is completely epithelial eyes over and there does not appear to be any fluctuance. I feel like this is just staining under the skin and not part of an active wound currently. Electronic Signature(s) Signed: 03/14/2017 5:49:59 PM By: Worthy Keeler PA-C Entered By: Worthy Keeler on 03/14/2017 17:02:55 [name], [name] ([id number]) -------------------------------------------------------------------------------- Physician Orders Details Patient Name: [name]. Date of Service: 03/14/2017 3:30 PM Medical Record Number: [id number] Patient Account Number: [id number] Date of Birth/Sex: Feb 28, 1941 (76 y.o. Male) Treating RN: Montey Hora Primary Care Provider: Tedra Senegal Other Clinician: Referring Provider: Tedra Senegal Treating Provider/Extender: [name], HOYT Weeks in Treatment: 6 Verbal /  Phone Orders: No Diagnosis Coding ICD-10 Coding Code Description E11.622 Type 2 diabetes mellitus with other skin ulcer Unspecified open wound of abdominal wall, epigastric region without penetration into S31.102D peritoneal cavity, subsequent encounter Z99.2 Dependence on renal dialysis Wound Cleansing Wound #2 Abdomen - midline o Clean wound with Normal Saline. o May Shower, gently pat wound dry prior to applying new dressing. Anesthetic Wound #2 Abdomen - midline o Topical Lidocaine 4% cream applied to wound bed prior to debridement Primary Wound Dressing Wound #2 Abdomen - midline o Foam Secondary Dressing Wound #2 Abdomen - midline o Boardered Foam Dressing - or telfa island Dressing Change Frequency Wound #2 Abdomen - midline o Change dressing every day. Follow-up Appointments Wound #2 Abdomen - midline o Return Appointment in 1 week. Additional Orders / Instructions Wound #2 Abdomen - midline o Increase protein intake. [name], [name] ([id number]) o Other: - TAKE ZINC, VITAMIN A AND VITAMIN C TO SUPPLEMENT AND AID IN WOUND HEALING Notes The next week I'm going to recommend that we apply a foam dressing for protection although I do not feel there is any open wound and I think he is going to do very well with this treatment. All we are really trying to do is ensure that nothing gets damaged over the next couple of weeks since this is a newly healed wound. He will be out of town next week so we will see him in two weeks and if anything worsens or changes in the meantime he will contact the office for further recommendations. Electronic Signature(s) Signed: 03/14/2017 5:49:59 PM By: Worthy Keeler PA-C Entered By: Worthy Keeler on 03/14/2017 17:04:00 [name], [name] ([id number]) -------------------------------------------------------------------------------- Problem List Details Patient Name: [name]. Date of Service: 03/14/2017 3:30  PM Medical Record Number: [id number] Patient Account Number: [id number] Date of Birth/Sex: 05/11/1941 (76 y.o. Male) Treating RN: Montey Hora Primary Care Provider: Tedra Senegal Other Clinician: Referring Provider: Tedra Senegal Treating Provider/Extender: Sharalyn Ink in Treatment: 6 Active Problems ICD-10 Encounter Code Description Active Date Diagnosis E11.622  Type 2 diabetes mellitus with other skin ulcer 01/28/2017 Yes S31.102D Unspecified open wound of abdominal wall, epigastric 01/28/2017 Yes region without penetration into peritoneal cavity, subsequent encounter Z99.2 Dependence on renal dialysis 01/28/2017 Yes Inactive Problems Resolved Problems Electronic Signature(s) Signed: 03/14/2017 5:49:59 PM By: Worthy Keeler PA-C Entered By: Worthy Keeler on 03/14/2017 16:17:20 [name], [name] ([id number]) -------------------------------------------------------------------------------- Progress Note Details Patient Name: [name]. Date of Service: 03/14/2017 3:30 PM Medical Record Number: [id number] Patient Account Number: [id number] Date of Birth/Sex: [birth year] (76 y.o. Male) Treating RN: Montey Hora Primary Care Provider: Tedra Senegal Other Clinician: Referring Provider: Tedra Senegal Treating Provider/Extender: [name], HOYT Weeks in Treatment: 6 Subjective Chief Complaint Information obtained from Patient Patients presents for treatment of an open diabetic ulcer to the periumbilical area which he has had for 2 weeks. he had a similar problem in January of this year and the ulcer healed with offloading and local care History of Present Illness (HPI) The following HPI elements were documented for the patient's wound: Location: periumbilical area. is on the left of the midline Quality: Patient reports No Pain. Severity: Patient states wound (s) are getting better. Duration: Patient has had the wound for < 2 weeks prior to presenting for  treatment Context: The wound appeared gradually over time Modifying Factors: Patient is currently on renal dialysis and receives treatments 3 times weekly Associated Signs and Symptoms: Patient reports having:then local care with hydrogen peroxide and Bactroban ointment 76 year old gentleman seen earlier this year in January for a abdominal wound is now back with the same problem which has recurred for about 2 weeks and he has been trying to apply some local antibiotic ointment and a Band-Aid there. Of note he has significant pressure in this area due to his beltline and may have put on some weight which results in his trousers being very tight around the waist. The else has changed in his HandP and his diabetes is pretty well controlled. most recent hemoglobin A1c on 12/06/2016 was 7.8%. he was reviewed by his endocrinologist Dr. Dwyane Dee who made appropriate modifications to his treatment plan. he was recently seen by neurology for mild cognitive impairment that was found to be normal for his age 06/14/17 patient appears to be doing well in regard to his abdominal wound area on evaluation today. He has been tolerating the dressing changes with the [facility] Dressing I'm pleased with how this has progressed and in fact the wound appears to be completely healed on evaluation today. There is definitely no evidence of infection. ====== Old notes The 76 year old gentleman who has a past medical history of diabetes mellitus, end-stage renal disease on dialysis, hypertension, secondary hyperparathyroidism, peripheral vascular disease and history of previous arterial bifemoral bypass graft in 1992 also has a history of gout, hyperlipidemia, hypothyroidism. last hemoglobin A1c was 7.5%. status post AV fistula placement, breast surgery for left granulomatous mastitis, triple a repair in 1992, small bowel obstruction, upper arm angiograms for AV fistula on the left side. [name], [name]  ([id number]) was noted to have a superficial skin wound in the periumbilical area since late December 2017. He was asked to clean it with hydrogen peroxide and apply Bactroban. He was seen by vascular surgery in October 2017 where his right ABI was 0.56 left ABI was 0.87 and right TBI was 0.69 and left TBI was 0.70 ===== Objective Constitutional Well-nourished and well-hydrated in no acute distress. Vitals Time Taken: 4:05 PM, Height: 72 in, Weight: 201 lbs, BMI: 27.3,  Temperature: 97.7 F, Pulse: 68 bpm, Respiratory Rate: 18 breaths/min, Blood Pressure: 126/42 mmHg. Respiratory normal breathing without difficulty. clear to auscultation bilaterally. Cardiovascular regular rate and rhythm with normal S1, S2. Psychiatric this patient is able to make decisions and demonstrates good insight into disease process. Alert and Oriented x 3. pleasant and cooperative. General Notes: There is a little bit of old appearing blood collected in the left inferior wound locations although this is completely epithelial eyes over and there does not appear to be any fluctuance. I feel like this is just staining under the skin and not part of an active wound currently. Integumentary (Hair, Skin) Wound #2 status is Open. Original cause of wound was Gradually Appeared. The wound is located on the Abdomen - midline. The wound measures 0.3cm length x 0.3cm width x 0.1cm depth; 0.071cm^2 area and 0.007cm^3 volume. Assessment Active Problems ICD-10 E11.622 - Type 2 diabetes mellitus with other skin ulcer S31.102D - Unspecified open wound of abdominal wall, epigastric region without penetration into peritoneal [name], [name] ([id number]) cavity, subsequent encounter Z99.2 - Dependence on renal dialysis Plan Wound Cleansing: Wound #2 Abdomen - midline: Clean wound with Normal Saline. May Shower, gently pat wound dry prior to applying new dressing. Anesthetic: Wound #2 Abdomen - midline: Topical Lidocaine  4% cream applied to wound bed prior to debridement Primary Wound Dressing: Wound #2 Abdomen - midline: Foam Secondary Dressing: Wound #2 Abdomen - midline: Boardered Foam Dressing - or telfa island Dressing Change Frequency: Wound #2 Abdomen - midline: Change dressing every day. Follow-up Appointments: Wound #2 Abdomen - midline: Return Appointment in 1 week. Additional Orders / Instructions: Wound #2 Abdomen - midline: Increase protein intake. Other: - TAKE ZINC, VITAMIN A AND VITAMIN C TO SUPPLEMENT AND AID IN WOUND HEALING General Notes: The next week I'm going to recommend that we apply a foam dressing for protection although I do not feel there is any open wound and I think he is going to do very well with this treatment. All we are really trying to do is ensure that nothing gets damaged over the next couple of weeks since this is a newly healed wound. He will be out of town next week so we will see him in two weeks and if anything worsens or changes in the meantime he will contact the office for further recommendations. Electronic Signature(s) Signed: 03/14/2017 5:49:59 PM By: Worthy Keeler PA-C Entered By: Worthy Keeler on 03/14/2017 17:04:12 [name], [name] ([id number]) [name], [name] ([id number]) -------------------------------------------------------------------------------- SuperBill Details Patient Name: [name]. Date of Service: 03/14/2017 Medical Record Number: [id number] Patient Account Number: [id number] Date of Birth/Sex: 02-23-1941 (76 y.o. Male) Treating RN: Montey Hora Primary Care Provider: Tedra Senegal Other Clinician: Referring Provider: Tedra Senegal Treating Provider/Extender: [name], HOYT Weeks in Treatment: 6 Diagnosis Coding ICD-10 Codes Code Description E11.622 Type 2 diabetes mellitus with other skin ulcer Unspecified open wound of abdominal wall, epigastric region without penetration into S31.102D peritoneal cavity, subsequent  encounter Z99.2 Dependence on renal dialysis Facility Procedures CPT4 Code: [id number] Description: [id number] - WOUND CARE VISIT-LEV 2 EST PT Modifier: Quantity: 1 Physician Procedures CPT4: Description Modifier Quantity Code [id number] - WC PHYS LEVEL 3 - EST PT 1 ICD-10 Description Diagnosis E11.622 Type 2 diabetes mellitus with other skin ulcer S31.102D Unspecified open wound of abdominal wall, epigastric region without  penetration into peritoneal cavity, subsequent encounter Z99.2 Dependence on renal dialysis Electronic Signature(s) Signed: 03/14/2017 5:06:20 PM By: Montey Hora Signed: 03/14/2017 5:49:59  PM By: Worthy Keeler PA-C Entered By: Montey Hora on 03/14/2017 17:06:20

## 2017-03-19 DIAGNOSIS — Z992 Dependence on renal dialysis: Secondary | ICD-10-CM | POA: Diagnosis not present

## 2017-03-19 DIAGNOSIS — N186 End stage renal disease: Secondary | ICD-10-CM | POA: Diagnosis not present

## 2017-03-21 DIAGNOSIS — Z992 Dependence on renal dialysis: Secondary | ICD-10-CM | POA: Diagnosis not present

## 2017-03-21 DIAGNOSIS — N186 End stage renal disease: Secondary | ICD-10-CM | POA: Diagnosis not present

## 2017-03-23 DIAGNOSIS — D631 Anemia in chronic kidney disease: Secondary | ICD-10-CM | POA: Diagnosis not present

## 2017-03-23 DIAGNOSIS — N186 End stage renal disease: Secondary | ICD-10-CM | POA: Diagnosis not present

## 2017-03-23 DIAGNOSIS — N2581 Secondary hyperparathyroidism of renal origin: Secondary | ICD-10-CM | POA: Diagnosis not present

## 2017-03-23 DIAGNOSIS — E1122 Type 2 diabetes mellitus with diabetic chronic kidney disease: Secondary | ICD-10-CM | POA: Diagnosis not present

## 2017-03-23 DIAGNOSIS — Z992 Dependence on renal dialysis: Secondary | ICD-10-CM | POA: Diagnosis not present

## 2017-03-25 DIAGNOSIS — N2581 Secondary hyperparathyroidism of renal origin: Secondary | ICD-10-CM | POA: Diagnosis not present

## 2017-03-25 DIAGNOSIS — D631 Anemia in chronic kidney disease: Secondary | ICD-10-CM | POA: Diagnosis not present

## 2017-03-25 DIAGNOSIS — N186 End stage renal disease: Secondary | ICD-10-CM | POA: Diagnosis not present

## 2017-03-25 DIAGNOSIS — E1129 Type 2 diabetes mellitus with other diabetic kidney complication: Secondary | ICD-10-CM | POA: Diagnosis not present

## 2017-03-27 DIAGNOSIS — D631 Anemia in chronic kidney disease: Secondary | ICD-10-CM | POA: Diagnosis not present

## 2017-03-27 DIAGNOSIS — E1129 Type 2 diabetes mellitus with other diabetic kidney complication: Secondary | ICD-10-CM | POA: Diagnosis not present

## 2017-03-27 DIAGNOSIS — N2581 Secondary hyperparathyroidism of renal origin: Secondary | ICD-10-CM | POA: Diagnosis not present

## 2017-03-27 DIAGNOSIS — N186 End stage renal disease: Secondary | ICD-10-CM | POA: Diagnosis not present

## 2017-03-29 ENCOUNTER — Encounter: Payer: Medicare Other | Attending: Physician Assistant | Admitting: Physician Assistant

## 2017-03-29 DIAGNOSIS — S31102D Unspecified open wound of abdominal wall, epigastric region without penetration into peritoneal cavity, subsequent encounter: Secondary | ICD-10-CM | POA: Insufficient documentation

## 2017-03-29 DIAGNOSIS — Z7982 Long term (current) use of aspirin: Secondary | ICD-10-CM | POA: Insufficient documentation

## 2017-03-29 DIAGNOSIS — M109 Gout, unspecified: Secondary | ICD-10-CM | POA: Insufficient documentation

## 2017-03-29 DIAGNOSIS — N186 End stage renal disease: Secondary | ICD-10-CM | POA: Insufficient documentation

## 2017-03-29 DIAGNOSIS — X58XXXD Exposure to other specified factors, subsequent encounter: Secondary | ICD-10-CM | POA: Diagnosis not present

## 2017-03-29 DIAGNOSIS — E1140 Type 2 diabetes mellitus with diabetic neuropathy, unspecified: Secondary | ICD-10-CM | POA: Insufficient documentation

## 2017-03-29 DIAGNOSIS — Z79899 Other long term (current) drug therapy: Secondary | ICD-10-CM | POA: Insufficient documentation

## 2017-03-29 DIAGNOSIS — I120 Hypertensive chronic kidney disease with stage 5 chronic kidney disease or end stage renal disease: Secondary | ICD-10-CM | POA: Insufficient documentation

## 2017-03-29 DIAGNOSIS — Z880 Allergy status to penicillin: Secondary | ICD-10-CM | POA: Diagnosis not present

## 2017-03-29 DIAGNOSIS — L98499 Non-pressure chronic ulcer of skin of other sites with unspecified severity: Secondary | ICD-10-CM | POA: Diagnosis not present

## 2017-03-29 DIAGNOSIS — D631 Anemia in chronic kidney disease: Secondary | ICD-10-CM | POA: Diagnosis not present

## 2017-03-29 DIAGNOSIS — E785 Hyperlipidemia, unspecified: Secondary | ICD-10-CM | POA: Diagnosis not present

## 2017-03-29 DIAGNOSIS — E039 Hypothyroidism, unspecified: Secondary | ICD-10-CM | POA: Diagnosis not present

## 2017-03-29 DIAGNOSIS — E1122 Type 2 diabetes mellitus with diabetic chronic kidney disease: Secondary | ICD-10-CM | POA: Diagnosis not present

## 2017-03-29 DIAGNOSIS — E1129 Type 2 diabetes mellitus with other diabetic kidney complication: Secondary | ICD-10-CM | POA: Diagnosis not present

## 2017-03-29 DIAGNOSIS — E11622 Type 2 diabetes mellitus with other skin ulcer: Secondary | ICD-10-CM | POA: Diagnosis not present

## 2017-03-29 DIAGNOSIS — Z87891 Personal history of nicotine dependence: Secondary | ICD-10-CM | POA: Diagnosis not present

## 2017-03-29 DIAGNOSIS — D649 Anemia, unspecified: Secondary | ICD-10-CM | POA: Insufficient documentation

## 2017-03-29 DIAGNOSIS — Z794 Long term (current) use of insulin: Secondary | ICD-10-CM | POA: Insufficient documentation

## 2017-03-29 DIAGNOSIS — Z992 Dependence on renal dialysis: Secondary | ICD-10-CM | POA: Diagnosis not present

## 2017-03-29 DIAGNOSIS — N2581 Secondary hyperparathyroidism of renal origin: Secondary | ICD-10-CM | POA: Diagnosis not present

## 2017-04-01 DIAGNOSIS — E1129 Type 2 diabetes mellitus with other diabetic kidney complication: Secondary | ICD-10-CM | POA: Diagnosis not present

## 2017-04-01 DIAGNOSIS — N186 End stage renal disease: Secondary | ICD-10-CM | POA: Diagnosis not present

## 2017-04-01 DIAGNOSIS — D631 Anemia in chronic kidney disease: Secondary | ICD-10-CM | POA: Diagnosis not present

## 2017-04-01 DIAGNOSIS — N2581 Secondary hyperparathyroidism of renal origin: Secondary | ICD-10-CM | POA: Diagnosis not present

## 2017-04-01 NOTE — Progress Notes (Signed)
[name], GRETH ([id number]) Visit Report for 03/29/2017 Chief Complaint Document Details Patient Name: TATELafayette, Dunlevy. Date of Service: 03/29/2017 9:15 AM Medical Record Number: [id number] Patient Account Number: [id number] Date of Birth/Sex: 1941/04/11 (76 y.o. Male) Treating RN: Montey Hora Primary Care Provider: Tedra Senegal Other Clinician: Referring Provider: Tedra Senegal Treating Provider/Extender: [name], HOYT Weeks in Treatment: 8 Information Obtained from: Patient Chief Complaint Patients presents for treatment of an open diabetic ulcer to the periumbilical area which he has had for 2 weeks. he had a similar problem in January of this year and the ulcer healed with offloading and local care Electronic Signature(s) Signed: 03/29/2017 5:50:43 PM By: Worthy Keeler PA-C Entered By: Worthy Keeler on 03/29/2017 17:28:41 Troiani, [name] ([id number]) -------------------------------------------------------------------------------- HPI Details Patient Name: [name]. Date of Service: 03/29/2017 9:15 AM Medical Record Number: [id number] Patient Account Number: [id number] Date of Birth/Sex: May 03, 1941 (76 y.o. Male) Treating RN: Montey Hora Primary Care Provider: Tedra Senegal Other Clinician: Referring Provider: Tedra Senegal Treating Provider/Extender: [name], HOYT Weeks in Treatment: 8 History of Present Illness Location: periumbilical area. is on the left of the midline Quality: Patient reports No Pain. Severity: Patient states wound (s) are getting better. Duration: Patient has had the wound for < 2 weeks prior to presenting for treatment Context: The wound appeared gradually over time Modifying Factors: Patient is currently on renal dialysis and receives treatments 3 times weekly Associated Signs and Symptoms: Patient reports having:then local care with hydrogen peroxide and Bactroban ointment HPI Description: 76 year old gentleman seen earlier this year in  January for a abdominal wound is now back with the same problem which has recurred for about 2 weeks and he has been trying to apply some local antibiotic ointment and a Band-Aid there. Of note he has significant pressure in this area due to his beltline and may have put on some weight which results in his trousers being very tight around the waist. The else has changed in his HandP and his diabetes is pretty well controlled. most recent hemoglobin A1c on 12/06/2016 was 7.8%. he was reviewed by his endocrinologist Dr. Dwyane Dee who made appropriate modifications to his treatment plan. he was recently seen by neurology for mild cognitive impairment that was found to be normal for his age 76/21/18 patient appears to be doing well in regard to his abdominal wound area on evaluation today. He has been tolerating the dressing changes with the [facility] Dressing I'm pleased with how this has progressed and in fact the wound appears to be completely healed on evaluation today. There is definitely no evidence of infection. 03/29/17 Patient's wound on the abdominal region appears to be completely healed on evaluation today. This wound has been doing very well and I think the biggest thing is going to be keeping it from reactivation where it rubbed on his pants. ====== Old notes The 76 year old gentleman who has a past medical history of diabetes mellitus, end-stage renal disease on dialysis, hypertension, secondary hyperparathyroidism, peripheral vascular disease and history of previous arterial bifemoral bypass graft in 1992 also has a history of gout, hyperlipidemia, hypothyroidism. last hemoglobin A1c was 7.5%. status post AV fistula placement, breast surgery for left granulomatous mastitis, triple a repair in 1992, small bowel obstruction, upper arm angiograms for AV fistula on the left side. was noted to have a superficial skin wound in the periumbilical area since late December 2017. He was asked  to clean it with hydrogen peroxide and apply Bactroban. He was  seen by vascular surgery in October 2017 where his right ABI was 0.56 left ABI was 0.87 and right TBI was 0.69 and left TBI was 0.70 ===== Blethen, TYREECE GELLES ([id number]) Electronic Signature(s) Signed: 03/29/2017 5:50:43 PM By: Worthy Keeler PA-C Entered By: Worthy Keeler on 03/29/2017 17:28:50 Najera, [name] ([id number]) -------------------------------------------------------------------------------- Physical Exam Details Patient Name: [name]. Date of Service: 03/29/2017 9:15 AM Medical Record Number: [id number] Patient Account Number: [id number] Date of Birth/Sex: [birth year] (76 y.o. Male) Treating RN: Montey Hora Primary Care Provider: Tedra Senegal Other Clinician: Referring Provider: Tedra Senegal Treating Provider/Extender: [name], HOYT Weeks in Treatment: 8 Constitutional Well-nourished and well-hydrated in no acute distress. Respiratory normal breathing without difficulty. Psychiatric this patient is able to make decisions and demonstrates good insight into disease process. Alert and Oriented x 3. pleasant and cooperative. Electronic Signature(s) Signed: 03/29/2017 5:50:43 PM By: Worthy Keeler PA-C Entered By: Worthy Keeler on 03/29/2017 17:29:10 Fadness, [name] ([id number]) -------------------------------------------------------------------------------- Physician Orders Details Patient Name: [name]. Date of Service: 03/29/2017 9:15 AM Medical Record Number: [id number] Patient Account Number: [id number] Date of Birth/Sex: [birth year] (76 y.o. Male) Treating RN: Montey Hora Primary Care Provider: Tedra Senegal Other Clinician: Referring Provider: Tedra Senegal Treating Provider/Extender: [name], HOYT Weeks in Treatment: 8 Verbal / Phone Orders: No Diagnosis Coding Discharge From [facility] Services o Discharge from [place] - Please obtain an abdominal binder and wear this  every day Notes At this point I'm going to discontinue wound care services as patient appears to be doing well. I did recommend that he get an abdominal binder or something of the like in order to protect the area going forward from future injury. He voiced understanding. Electronic Signature(s) Signed: 03/29/2017 5:50:43 PM By: Worthy Keeler PA-C Previous Signature: 03/29/2017 4:43:55 PM Version By: Montey Hora Previous Signature: 03/29/2017 4:47:40 PM Version By: Worthy Keeler PA-C Entered By: Worthy Keeler on 03/29/2017 17:29:34 Amir, [name] ([id number]) -------------------------------------------------------------------------------- Problem List Details Patient Name: [name]. Date of Service: 03/29/2017 9:15 AM Medical Record Number: [id number] Patient Account Number: [id number] Date of Birth/Sex: [birth year] (76 y.o. 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 Date of Service: 03/29/2017 9:15 AM Medical Record Number: [id number] Patient Account Number: [id number] Date of Birth/Sex: 1941-02-28 (76 y.o. Male) Treating RN: Montey Hora Primary Care Provider: Tedra Senegal Other Clinician: Referring Provider: Tedra Senegal Treating Provider/Extender: [name], HOYT Weeks in Treatment: 8 Subjective Chief Complaint Information obtained from Patient Patients presents for treatment of an open diabetic ulcer to the periumbilical area which he has had for 2 weeks. he had a similar problem in January of this year and the ulcer healed with offloading and local care History of Present Illness (HPI) The following HPI elements were documented for the patient's wound: Location: periumbilical area. is on the left of the midline Quality: Patient reports No Pain. Severity: Patient states  wound (s) are getting better. Duration: Patient has had the wound for < 2 weeks prior to presenting for treatment Context: The wound appeared gradually over time Modifying Factors: Patient is currently on renal dialysis and receives treatments 3 times weekly Associated Signs and Symptoms: Patient reports having:then local care with hydrogen peroxide and Bactroban ointment 76 year old gentleman seen earlier this year in January for a abdominal wound is now back with the same problem which has recurred for about 2 weeks and he has been trying to apply some local antibiotic ointment and a Band-Aid there. Of note he has significant pressure in this area due to his beltline and may have put on some weight which results in his trousers being very tight around the waist. The else has changed in his HandP and his diabetes is pretty well controlled. most recent hemoglobin A1c on 12/06/2016 was 7.8%. he was reviewed by his endocrinologist Dr. Dwyane Dee who made appropriate modifications to his treatment plan. he was recently seen by neurology for mild cognitive impairment that was found to be normal for his age 23/21/18 patient appears to be doing well in regard to his abdominal wound area on evaluation today. He has been tolerating the dressing changes with the [facility] Dressing I'm pleased  with how this has progressed and in fact the wound appears to be completely healed on evaluation today. There is definitely no evidence of infection. 03/29/17 Patient's wound on the abdominal region appears to be completely healed on evaluation today. This wound has been doing very well and I think the biggest thing is going to be keeping it from reactivation where it rubbed on his pants. ====== Old notes The 76 year old gentleman who has a past medical history of diabetes mellitus, end-stage renal disease on dialysis, hypertension, secondary hyperparathyroidism, peripheral vascular disease and history of previous Martinec, ANATOLE APOLLO. ([id number]) arterial bifemoral bypass graft in 1992 also has a history of gout, hyperlipidemia, hypothyroidism. last hemoglobin A1c was 7.5%. status post AV fistula placement, breast surgery for left granulomatous mastitis, triple a repair in 1992, small bowel obstruction, upper arm angiograms for AV fistula on the left side. was noted to have a superficial skin wound in the periumbilical area since late December 2017. He was asked to clean it with hydrogen peroxide and apply Bactroban. He was seen by vascular surgery in October 2017 where his right ABI was 0.56 left ABI was 0.87 and right TBI was 0.69 and left TBI was 0.70 ===== Objective Constitutional Well-nourished and well-hydrated in no acute distress. Vitals Time Taken: 9:29 AM, Height: 72 in, Weight: 201 lbs, BMI: 27.3, Temperature: 97.9 F, Pulse: 60 bpm, Respiratory Rate: 18 breaths/min, Blood Pressure: 133/86 mmHg. Respiratory normal breathing without difficulty. Psychiatric this patient is able to make decisions and demonstrates good insight into disease process. Alert and Oriented x 3. pleasant and cooperative. Integumentary (Hair, Skin) Wound #2 status is Open. Original cause of wound was Gradually Appeared. The wound is located on the Abdomen - midline. The wound measures 0cm length x 0cm width  x 0cm depth; 0cm^2 area and 0cm^3 volume. Assessment Active Problems ICD-10 E11.622 - Type 2 diabetes mellitus with other skin ulcer S31.102D - Unspecified open wound of abdominal wall, epigastric region without penetration into peritoneal Galla, VANE YAPP ([id number]) cavity, subsequent encounter Z99.2 - Dependence on renal dialysis Plan Discharge From [facility] Services: Discharge from [place] - Please obtain an abdominal binder and wear this every day General Notes: At this point I'm going to discontinue  wound care services as patient appears to be doing well. I did recommend that he get an abdominal binder or something of the like in order to protect the area going forward from future injury. He voiced understanding. Electronic Signature(s) Signed: 03/29/2017 5:50:43 PM By: Worthy Keeler PA-C Entered By: Worthy Keeler on 03/29/2017 17:29:47 Dopson, [name] ([id number]) -------------------------------------------------------------------------------- SuperBill Details Patient Name: [name]. Date of Service: 03/29/2017 Medical Record Number: [id number] Patient Account Number: [id number] Date of Birth/Sex: 1940-10-04 (76 y.o. 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 Unspecified open wound of abdominal wall, epigastric region without penetration into S31.102D peritoneal cavity, subsequent encounter Z99.2 Dependence on renal dialysis Facility Procedures CPT4 Code: [id number] Description: [phone] - WOUND CARE VISIT-LEV 2 EST PT Modifier: Quantity: 1 Physician Procedures CPT4: Description Modifier Quantity Code [id number] - WC PHYS LEVEL 2 - EST PT 1 ICD-10 Description Diagnosis E11.622 Type 2 diabetes mellitus with other  skin ulcer S31.102D Unspecified open wound of abdominal wall, epigastric region without  penetration into peritoneal cavity, subsequent encounter Z99.2 Dependence on renal dialysis Electronic Signature(s) Signed: 03/29/2017 5:50:43 PM By: Worthy Keeler PA-C Entered By: Worthy Keeler on 03/29/2017 17:30:50

## 2017-04-01 NOTE — Progress Notes (Signed)
[name] ([id number]) Visit Report for 03/29/2017 Arrival Information Details Patient Name: [name]. Date of Service: 03/29/2017 9:15 AM Medical Record Number: [id number] Patient Account Number: [id number] Date of Birth/Sex: [birth year] (76 y.o. Male) Treating RN: [name] Primary Care [name]: [name] Other Clinician: Referring [name]: [name] Treating [name]/Extender: [name] Weeks in Treatment: 8 Visit Information History Since Last Visit Added or deleted any medications: No Patient Arrived: Ambulatory Any new allergies or adverse reactions: No Arrival Time: 09:27 Had a fall or experienced change in No Accompanied By: self activities of daily living that may affect Transfer Assistance: None risk of falls: Patient Identification Verified: Yes Signs or symptoms of abuse/neglect since last No Secondary Verification Process Yes visito Completed: Hospitalized since last visit: No Patient Has Alerts: Yes Has Dressing in Place as Prescribed: Yes Patient Alerts: Patient on Blood Pain Present Now: No Thinner 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 Date of Service: 03/29/2017 9:15 AM Medical Record Number: [id number] Patient Account Number: [id number] Date of Birth/Sex: [birth year] (76 y.o. Male) Treating RN: [name] Primary Care Ajahni Nay: [name] Other Clinician: Referring Halana Deisher: [name] Treating Donyea Beverlin/Extender: [name] Weeks in Treatment: 8 Clinic Level of Care Assessment Items TOOL 4 Quantity Score []  - Use when only an EandM is performed on FOLLOW-UP visit 0 ASSESSMENTS - Nursing Assessment / Reassessment X - Reassessment of  Co-morbidities (includes updates in patient status) 1 10 X - Reassessment of Adherence to Treatment Plan 1 5 ASSESSMENTS - Wound and Skin Assessment / Reassessment X - Simple Wound Assessment / Reassessment - one wound 1 5 []  - Complex Wound Assessment / Reassessment - multiple wounds 0 []  - Dermatologic / Skin Assessment (not related to wound area) 0 ASSESSMENTS - Focused Assessment []  - Circumferential Edema Measurements - multi extremities 0 []  - Nutritional Assessment / Counseling / Intervention 0 []  - Lower Extremity Assessment (monofilament, tuning fork, pulses) 0 []  - Peripheral Arterial Disease Assessment (using hand held doppler) 0 ASSESSMENTS - Ostomy and/or Continence Assessment and Care []  - Incontinence Assessment and Management 0 []  - Ostomy Care Assessment and Management (repouching, etc.) 0 PROCESS - Coordination of Care X - Simple Patient / Family Education for ongoing care 1 15 []  - Complex (extensive) Patient / Family Education for ongoing care 0 []  - Staff obtains Programmer, systems, Records, Test Results / Process Orders 0 []  - Staff telephones HHA, Nursing Homes / Clarify orders / etc 0 []  - Routine Transfer to another Facility (non-emergent condition) 0 [name] ([id number]) []  - Routine Hospital Admission (non-emergent condition) 0 []  - New Admissions / Biomedical engineer / Ordering NPWT, Apligraf, etc. 0 []  - Emergency Hospital Admission (emergent condition) 0 X - Simple Discharge Coordination 1 10 []  - Complex (extensive) Discharge Coordination 0 PROCESS - Special Needs []  - Pediatric / Minor Patient Management 0 []  - Isolation Patient Management 0 []  - Hearing / Language / Visual special needs 0 []  - Assessment of Community assistance (transportation, D/C planning, etc.) 0 []  - Additional assistance / Altered mentation 0 []  - Support Surface(s) Assessment (bed, cushion, seat, etc.) 0 INTERVENTIONS - Wound Cleansing / Measurement X - Simple Wound  Cleansing - one wound 1 5 []  - Complex Wound Cleansing - multiple wounds 0 X - Wound Imaging (photographs - any number of  wounds) 1 5 []  - Wound Tracing (instead of photographs) 0 X - Simple Wound Measurement - one wound 1 5 []  - Complex Wound Measurement - multiple wounds 0 INTERVENTIONS - Wound Dressings []  - Small Wound Dressing one or multiple wounds 0 []  - Medium Wound Dressing one or multiple wounds 0 []  - Large Wound Dressing one or multiple wounds 0 []  - Application of Medications - topical 0 []  - Application of Medications - injection 0 INTERVENTIONS - Miscellaneous []  - External ear exam 0 [name] ([id number]) []  - Specimen Collection (cultures, biopsies, blood, body fluids, etc.) 0 []  - Specimen(s) / Culture(s) sent or taken to Lab for analysis 0 []  - Patient Transfer (multiple staff / Harrel Lemon Lift / Similar devices) 0 []  - Simple Staple / Suture removal (25 or less) 0 []  - Complex Staple / Suture removal (26 or more) 0 []  - Hypo / Hyperglycemic Management (close monitor of Blood Glucose) 0 []  - Ankle / Brachial Index (ABI) - do not check if billed separately 0 X - Vital Signs 1 5 Has the patient been seen at the hospital within the last three years: Yes Total Score: 65 Level Of Care: New/Established - Level 2 Electronic Signature(s) Signed: 03/29/2017 4:43:55 PM By: [name] Entered By: [name] on 03/29/2017 09:59:19 [name] ([id number]) -------------------------------------------------------------------------------- Encounter Discharge Information Details Patient Name: [name]. Date of Service: 03/29/2017 9:15 AM Medical Record Number: [id number] Patient Account Number: [id number] Date of Birth/Sex: Jun 07, 1941 (76 y.o. Male) Treating RN: [name] Primary Care [name]: [name] Other Clinician: Referring [name]: [name] Treating [name]: [name] Weeks in Treatment: 8 Encounter Discharge Information  Items Discharge Pain Level: 0 Discharge Condition: Stable Ambulatory Status: Ambulatory Discharge Destination: Home Transportation: Private Auto Accompanied By: self Schedule Follow-up Appointment: No Medication Reconciliation completed and provided to Patient/Care No Kinslea Frances: Provided on Clinical Summary of Care: 03/29/2017 Form Type Recipient Paper Patient WT Electronic Signature(s) Signed: 03/29/2017 10:05:38 AM By: Ruthine Dose Entered By: Ruthine Dose on 03/29/2017 10:05:38 Morace, [name] ([id number]) -------------------------------------------------------------------------------- [place] Details Patient Name: [name]. Date of Service: 03/29/2017 9:15 AM Medical Record Number: [id number] Patient Account Number: [id number] Date of Birth/Sex: 10/31/1940 (76 y.o. Male) Treating RN: [name] Primary Care Bronnie Vasseur: [name] Other Clinician: Referring Dyllon Henken: [name] Treating Madyx Delfin/Extender: [name] Weeks in Treatment: 8 Active Inactive Electronic Signature(s) Signed: 03/29/2017 4:43:55 PM By: [name] Entered By: [name] on 03/29/2017 09:57:31 Halley, [name] ([id number]) -------------------------------------------------------------------------------- Pain Assessment Details Patient Name: [name]. Date of Service: 03/29/2017 9:15 AM Medical Record Number: [id number] Patient Account Number: [id number] Date of Birth/Sex: [birth year] (76 y.o. Male) Treating RN: [name] Primary Care Lurline Caver: [name] Other Clinician: Referring Odie Edmonds: [name] Treating Marieclaire Bettenhausen/Extender: [name] Weeks in Treatment: 8 Active Problems Location of Pain Severity and Description of Pain Patient Has Paino No Site Locations Pain Management and Medication Current Pain Management: Notes Topical or injectable lidocaine is offered to patient for acute pain when surgical debridement is performed.  If needed, Patient is instructed to use over the counter pain medication for the following 24-48 hours after debridement. Wound care MDs do not prescribed pain medications. Patient has chronic pain or uncontrolled pain. Patient has been instructed to make an appointment with their Primary Care Physician for pain management. Electronic Signature(s) Signed: 03/29/2017 4:43:55 PM By: [name] Entered By: [name] on 03/29/2017 09:29:30 Kattner, [name] ([id number]) -------------------------------------------------------------------------------- Patient/Caregiver Education Details  Patient Name: HARPER, SMOKER. Date of Service: 03/29/2017 9:15 AM Medical Record Number: [id number] Patient Account Number: [id number] Date of Birth/Gender: 17-Apr-1941 (75 y.o. Male) Treating RN: [name] Primary Care Physician: [name] Other Clinician: Referring Physician: Tedra [name] Treating Physician/Extender: Sharalyn Ink in Treatment: 8 Education Assessment Education Provided To: Patient Education Topics Provided Basic Hygiene: Handouts: Other: care of newly healed ucer site Methods: Explain/Verbal Responses: State content correctly Electronic Signature(s) Signed: 03/29/2017 4:43:55 PM By: [name] Entered By: [name] on 03/29/2017 10:05:12 Iser, [name] ([id number]) -------------------------------------------------------------------------------- Wound Assessment Details Patient Name: [name]. Date of Service: 03/29/2017 9:15 AM Medical Record Number: [id number] Patient Account Number: [id number] Date of Birth/Sex: [birth year] (76 y.o. Male) Treating RN: [name] Primary Care Reiley Bertagnolli: [name] Other Clinician: Referring Deshawn Skelley: [name] Treating Brentton Wardlow/Extender: [name] Weeks in Treatment: 8 Wound Status Wound Number: 2 Primary Etiology: Pressure Ulcer Wound Location: Abdomen - midline Wound Status: Open Wounding  Event: Gradually Appeared Date Acquired: 01/14/2017 Weeks Of Treatment: 8 Clustered Wound: No Photos Photo Uploaded By: [name] on 03/29/2017 11:58:52 Wound Measurements Length: (cm) 0 % Reduction in Width: (cm) 0 % Reduction in Depth: (cm) 0 Area: (cm) 0 Volume: (cm) 0 Area: 100% Volume: 100% Wound Description Classification: Category/Stage II Periwound Skin Texture Texture Color No Abnormalities Noted: No No Abnormalities Noted: No Moisture No Abnormalities Noted: No Electronic Signature(s) Signed: 03/29/2017 4:43:55 PM By: Liana Gerold ([id number]) Entered By: [name] on 03/29/2017 09:32:56 Lillo, [name] ([id number]) -------------------------------------------------------------------------------- Vitals Details Patient Name: [name]. Date of Service: 03/29/2017 9:15 AM Medical Record Number: [id number] Patient Account Number: [id number] Date of Birth/Sex: [birth year] (76 y.o. Male) Treating RN: [name] Primary Care Olivene Cookston: [name] Other Clinician: Referring Odean Mcelwain: [name] Treating Bow Buntyn/Extender: [name] Weeks in Treatment: 8 Vital Signs Time Taken: 09:29 Temperature (F): 97.9 Height (in): 72 Pulse (bpm): 60 Weight (lbs): 201 Respiratory Rate (breaths/min): 18 Body Mass Index (BMI): 27.3 Blood Pressure (mmHg): 133/86 Reference Range: 80 - 120 mg / dl Electronic Signature(s) Signed: 03/29/2017 4:43:55 PM By: [name] Entered By: [name] on 03/29/2017 09:30:05

## 2017-04-02 ENCOUNTER — Other Ambulatory Visit: Payer: Self-pay

## 2017-04-02 ENCOUNTER — Telehealth: Payer: Self-pay | Admitting: Endocrinology

## 2017-04-02 NOTE — Telephone Encounter (Signed)
Pt came by req script for needles to inject insulin. Pt req call script to [facility] on [address] (may be [facility] now).

## 2017-04-02 NOTE — Telephone Encounter (Signed)
Left vm letting patient know that we do not have a prescription for pen needles on file for him and requested a call back as soon as possible

## 2017-04-03 ENCOUNTER — Other Ambulatory Visit: Payer: Self-pay

## 2017-04-03 DIAGNOSIS — N186 End stage renal disease: Secondary | ICD-10-CM | POA: Diagnosis not present

## 2017-04-03 DIAGNOSIS — E1129 Type 2 diabetes mellitus with other diabetic kidney complication: Secondary | ICD-10-CM | POA: Diagnosis not present

## 2017-04-03 DIAGNOSIS — D631 Anemia in chronic kidney disease: Secondary | ICD-10-CM | POA: Diagnosis not present

## 2017-04-03 DIAGNOSIS — N2581 Secondary hyperparathyroidism of renal origin: Secondary | ICD-10-CM | POA: Diagnosis not present

## 2017-04-03 MED ORDER — INSULIN PEN NEEDLE 30G X 8 MM MISC: 4 refills | Status: DC

## 2017-04-03 NOTE — Telephone Encounter (Signed)
Spoke with the patient and he needs 0.5mg  30g insulin pen needles which I have ordered for him

## 2017-04-04 DIAGNOSIS — L853 Xerosis cutis: Secondary | ICD-10-CM | POA: Diagnosis not present

## 2017-04-04 DIAGNOSIS — L218 Other seborrheic dermatitis: Secondary | ICD-10-CM | POA: Diagnosis not present

## 2017-04-05 DIAGNOSIS — D631 Anemia in chronic kidney disease: Secondary | ICD-10-CM | POA: Diagnosis not present

## 2017-04-05 DIAGNOSIS — N2581 Secondary hyperparathyroidism of renal origin: Secondary | ICD-10-CM | POA: Diagnosis not present

## 2017-04-05 DIAGNOSIS — E1129 Type 2 diabetes mellitus with other diabetic kidney complication: Secondary | ICD-10-CM | POA: Diagnosis not present

## 2017-04-05 DIAGNOSIS — N186 End stage renal disease: Secondary | ICD-10-CM | POA: Diagnosis not present

## 2017-04-08 DIAGNOSIS — N2581 Secondary hyperparathyroidism of renal origin: Secondary | ICD-10-CM | POA: Diagnosis not present

## 2017-04-08 DIAGNOSIS — N186 End stage renal disease: Secondary | ICD-10-CM | POA: Diagnosis not present

## 2017-04-08 DIAGNOSIS — E1129 Type 2 diabetes mellitus with other diabetic kidney complication: Secondary | ICD-10-CM | POA: Diagnosis not present

## 2017-04-08 DIAGNOSIS — D631 Anemia in chronic kidney disease: Secondary | ICD-10-CM | POA: Diagnosis not present

## 2017-04-10 DIAGNOSIS — E1129 Type 2 diabetes mellitus with other diabetic kidney complication: Secondary | ICD-10-CM | POA: Diagnosis not present

## 2017-04-10 DIAGNOSIS — N2581 Secondary hyperparathyroidism of renal origin: Secondary | ICD-10-CM | POA: Diagnosis not present

## 2017-04-10 DIAGNOSIS — D631 Anemia in chronic kidney disease: Secondary | ICD-10-CM | POA: Diagnosis not present

## 2017-04-10 DIAGNOSIS — N186 End stage renal disease: Secondary | ICD-10-CM | POA: Diagnosis not present

## 2017-04-12 DIAGNOSIS — E1129 Type 2 diabetes mellitus with other diabetic kidney complication: Secondary | ICD-10-CM | POA: Diagnosis not present

## 2017-04-12 DIAGNOSIS — D631 Anemia in chronic kidney disease: Secondary | ICD-10-CM | POA: Diagnosis not present

## 2017-04-12 DIAGNOSIS — N2581 Secondary hyperparathyroidism of renal origin: Secondary | ICD-10-CM | POA: Diagnosis not present

## 2017-04-12 DIAGNOSIS — N186 End stage renal disease: Secondary | ICD-10-CM | POA: Diagnosis not present

## 2017-04-15 DIAGNOSIS — N186 End stage renal disease: Secondary | ICD-10-CM | POA: Diagnosis not present

## 2017-04-15 DIAGNOSIS — D631 Anemia in chronic kidney disease: Secondary | ICD-10-CM | POA: Diagnosis not present

## 2017-04-15 DIAGNOSIS — E1129 Type 2 diabetes mellitus with other diabetic kidney complication: Secondary | ICD-10-CM | POA: Diagnosis not present

## 2017-04-15 DIAGNOSIS — N2581 Secondary hyperparathyroidism of renal origin: Secondary | ICD-10-CM | POA: Diagnosis not present

## 2017-04-17 DIAGNOSIS — N2581 Secondary hyperparathyroidism of renal origin: Secondary | ICD-10-CM | POA: Diagnosis not present

## 2017-04-17 DIAGNOSIS — N186 End stage renal disease: Secondary | ICD-10-CM | POA: Diagnosis not present

## 2017-04-17 DIAGNOSIS — E1129 Type 2 diabetes mellitus with other diabetic kidney complication: Secondary | ICD-10-CM | POA: Diagnosis not present

## 2017-04-17 DIAGNOSIS — D631 Anemia in chronic kidney disease: Secondary | ICD-10-CM | POA: Diagnosis not present

## 2017-04-18 ENCOUNTER — Encounter: Payer: Medicare Other | Admitting: Surgery

## 2017-04-18 DIAGNOSIS — I120 Hypertensive chronic kidney disease with stage 5 chronic kidney disease or end stage renal disease: Secondary | ICD-10-CM | POA: Diagnosis not present

## 2017-04-18 DIAGNOSIS — L98499 Non-pressure chronic ulcer of skin of other sites with unspecified severity: Secondary | ICD-10-CM | POA: Diagnosis not present

## 2017-04-18 DIAGNOSIS — N186 End stage renal disease: Secondary | ICD-10-CM | POA: Diagnosis not present

## 2017-04-18 DIAGNOSIS — E1122 Type 2 diabetes mellitus with diabetic chronic kidney disease: Secondary | ICD-10-CM | POA: Diagnosis not present

## 2017-04-18 DIAGNOSIS — S31102D Unspecified open wound of abdominal wall, epigastric region without penetration into peritoneal cavity, subsequent encounter: Secondary | ICD-10-CM | POA: Diagnosis not present

## 2017-04-18 DIAGNOSIS — E11622 Type 2 diabetes mellitus with other skin ulcer: Secondary | ICD-10-CM | POA: Diagnosis not present

## 2017-04-18 DIAGNOSIS — Z992 Dependence on renal dialysis: Secondary | ICD-10-CM | POA: Diagnosis not present

## 2017-04-19 DIAGNOSIS — E1129 Type 2 diabetes mellitus with other diabetic kidney complication: Secondary | ICD-10-CM | POA: Diagnosis not present

## 2017-04-19 DIAGNOSIS — N186 End stage renal disease: Secondary | ICD-10-CM | POA: Diagnosis not present

## 2017-04-19 DIAGNOSIS — D631 Anemia in chronic kidney disease: Secondary | ICD-10-CM | POA: Diagnosis not present

## 2017-04-19 DIAGNOSIS — N2581 Secondary hyperparathyroidism of renal origin: Secondary | ICD-10-CM | POA: Diagnosis not present

## 2017-04-20 NOTE — Progress Notes (Signed)
[name], [name] ([id number]) Visit Report for 04/18/2017 Chief Complaint Document Details Patient Name: [name], [name]. Date of Service: 04/18/2017 3:45 PM Medical Record Number: [id number] Patient Account Number: [id number] Date of Birth/Sex: [birth year] (76 y.o. Male) Treating RN: Baruch Gouty, RN, BSN, Velva Harman Primary Care Provider: Tedra Senegal Other Clinician: Referring Provider: Tedra Senegal Treating Provider/Extender: Frann Rider in Treatment: 11 Information Obtained from: Patient Chief Complaint Patients presents for treatment of an open diabetic ulcer to the periumbilical area which he has had for 2 weeks. he had a similar problem in January of this year and the ulcer healed with offloading and local care Electronic Signature(s) Signed: 04/18/2017 4:23:20 PM By: Christin Fudge MD, FACS Entered By: Christin Fudge on 04/18/2017 16:23:19 [name], [name] ([id number]) -------------------------------------------------------------------------------- HPI Details Patient Name: [name]. Date of Service: 04/18/2017 3:45 PM Medical Record Number: [id number] Patient Account Number: [id number] Date of Birth/Sex: [birth year] (76 y.o. Male) Treating RN: Baruch Gouty, RN, BSN, Velva Harman Primary Care Provider: Tedra Senegal Other Clinician: Referring Provider: Tedra Senegal Treating Provider/Extender: Frann Rider in Treatment: 11 History of Present Illness Location: periumbilical area. is on the left of the midline Quality: Patient reports No Pain. Severity: Patient states wound (s) are getting better. Duration: Patient has had the wound for < 2 weeks prior to presenting for treatment Context: The wound appeared gradually over time Modifying Factors: Patient is currently on renal dialysis and receives treatments 3 times weekly Associated Signs and Symptoms: Patient reports having:then local care with hydrogen peroxide and Bactroban ointment HPI Description: 76 year old gentleman seen earlier  this year in January for a abdominal wound is now back with the same problem which has recurred for about 2 weeks and he has been trying to apply some local antibiotic ointment and a Band-Aid there. Of note he has significant pressure in this area due to his beltline and may have put on some weight which results in his trousers being very tight around the waist. The else has changed in his HandP and his diabetes is pretty well controlled. most recent hemoglobin A1c on 12/06/2016 was 7.8%. he was reviewed by his endocrinologist Dr. Dwyane Dee who made appropriate modifications to his treatment plan. he was recently seen by neurology for mild cognitive impairment that was found to be normal for his age 109/21/18 patient appears to be doing well in regard to his abdominal wound area on evaluation today. He has been tolerating the dressing changes with the [facility] Dressing I'm pleased with how this has progressed and in fact the wound appears to be completely healed on evaluation today. There is definitely no evidence of infection. 03/29/17 Patient's wound on the abdominal region appears to be completely healed on evaluation today. This wound has been doing very well and I think the biggest thing is going to be keeping it from reactivation where it rubbed on his pants. 04/18/2017 -- he was recently discharged about 3 weeks ago and was using a binder which probably caused him abrasion on the area of the recently healed scar. ====== Old notes The 76 year old gentleman who has a past medical history of diabetes mellitus, end-stage renal disease on dialysis, hypertension, secondary hyperparathyroidism, peripheral vascular disease and history of previous arterial bifemoral bypass graft in 1992 also has a history of gout, hyperlipidemia, hypothyroidism. last hemoglobin A1c was 7.5%. status post AV fistula placement, breast surgery for left granulomatous mastitis, triple a repair in 1992, small bowel  obstruction, upper arm angiograms for AV fistula on the left side. was  noted to have a superficial skin wound in the periumbilical area since late December 2017. He was asked to clean it with hydrogen peroxide and apply Bactroban. [name], [name] ([id number]) He was seen by vascular surgery in October 2017 where his right ABI was 0.56 left ABI was 0.87 and right TBI was 0.69 and left TBI was 0.70 ===== Electronic Signature(s) Signed: 04/18/2017 4:23:59 PM By: Christin Fudge MD, FACS Entered By: Christin Fudge on 04/18/2017 16:23:59 [name], [name] ([id number]) -------------------------------------------------------------------------------- Physical Exam Details Patient Name: [name]. Date of Service: 04/18/2017 3:45 PM Medical Record Number: [id number] Patient Account Number: [id number] Date of Birth/Sex: [birth year] (76 y.o. Male) Treating RN: Baruch Gouty, RN, BSN, Velva Harman Primary Care Provider: Tedra Senegal Other Clinician: Referring Provider: Tedra Senegal Treating Provider/Extender: Frann Rider in Treatment: 11 Constitutional . Pulse regular. Respirations normal and unlabored. Afebrile. . Eyes Nonicteric. Reactive to light. Ears, Nose, Mouth, and Throat Lips, teeth, and gums WNL.Marland Kitchen Moist mucosa without lesions. Neck supple and nontender. No palpable supraclavicular or cervical adenopathy. Normal sized without goiter. Respiratory WNL. No retractions.. Breath sounds WNL, No rubs, rales, rhonchi, or wheeze.. Cardiovascular Heart rhythm and rate regular, no murmur or gallop.. Pedal Pulses WNL. No clubbing, cyanosis or edema. Chest Breasts symmetical and no nipple discharge.. Breast tissue WNL, no masses, lumps, or tenderness.. Lymphatic No adneopathy. No adenopathy. No adenopathy. Musculoskeletal Adexa without tenderness or enlargement.. Digits and nails w/o clubbing, cyanosis, infection, petechiae, ischemia, or inflammatory conditions.. Integumentary (Hair, Skin) No  suspicious lesions. No crepitus or fluctuance. No peri-wound warmth or erythema. No masses.Marland Kitchen Psychiatric Judgement and insight Intact.. No evidence of depression, anxiety, or agitation.. Notes the patient was examined in the standing position and he has a deep crevice, because of his surgical scar and this I believe interferes with his trousers, causing an abrasion on the scar tissue and causing a superficial ulceration. It almost looks like a blister which has opened out. No sharp debridement was required Electronic Signature(s) Signed: 04/18/2017 4:25:07 PM By: Christin Fudge MD, FACS Entered By: Christin Fudge on 04/18/2017 16:25:06 [name], [name] ([id number]) -------------------------------------------------------------------------------- Physician Orders Details Patient Name: [name]. Date of Service: 04/18/2017 3:45 PM Medical Record Number: [id number] Patient Account Number: [id number] Date of Birth/Sex: January 01, 1941 (76 y.o. Male) Treating RN: Baruch Gouty, RN, BSN, [place] Primary Care Provider: Tedra Senegal Other Clinician: Referring Provider: Tedra Senegal Treating Provider/Extender: Frann Rider in Treatment: 49 Verbal / Phone Orders: No Diagnosis Coding Wound Cleansing Wound #3 Medial Abdomen - midline o Cleanse wound with mild soap and water Primary Wound Dressing Wound #3 Medial Abdomen - midline o Foam Secondary Dressing Wound #3 Medial Abdomen - midline o Dry Gauze Dressing Change Frequency Wound #3 Medial Abdomen - midline o Change dressing every day. Follow-up Appointments Wound #3 Medial Abdomen - midline o Return Appointment in 2 weeks. Electronic Signature(s) Signed: 04/18/2017 4:26:53 PM By: Christin Fudge MD, FACS Signed: 04/18/2017 4:40:57 PM By: Regan Lemming BSN, RN Entered By: Regan Lemming on 04/18/2017 16:20:55 [name], [name] ([id number]) -------------------------------------------------------------------------------- Problem List  Details Patient Name: [name]. Date of Service: 04/18/2017 3:45 PM Medical Record Number: [id number] Patient Account Number: [id number] Date of Birth/Sex: 27-Mar-1941 (76 y.o. Male) Treating RN: Baruch Gouty, RN, BSN, [place] Primary Care Provider: Tedra Senegal Other Clinician: Referring Provider: Tedra Senegal Treating Provider/Extender: Frann Rider in Treatment: 11 Active Problems ICD-10 Encounter Code Description Active Date Diagnosis E11.622 Type 2 diabetes mellitus with other skin ulcer 01/28/2017 Yes S31.102D Unspecified open wound of abdominal  wall, epigastric 01/28/2017 Yes region without penetration into peritoneal cavity, subsequent encounter Z99.2 Dependence on renal dialysis 01/28/2017 Yes Inactive Problems Resolved Problems Electronic Signature(s) Signed: 04/18/2017 4:23:07 PM By: Christin Fudge MD, FACS Entered By: Christin Fudge on 04/18/2017 16:23:06 [name], [name] ([id number]) -------------------------------------------------------------------------------- Progress Note Details Patient Name: [name]. Date of Service: 04/18/2017 3:45 PM Medical Record Number: [id number] Patient Account Number: [id number] Date of Birth/Sex: 07-26-1941 (76 y.o. Male) Treating RN: Baruch Gouty, RN, BSN, Velva Harman Primary Care Provider: Tedra Senegal Other Clinician: Referring Provider: Tedra Senegal Treating Provider/Extender: Frann Rider in Treatment: 11 Subjective Chief Complaint Information obtained from Patient Patients presents for treatment of an open diabetic ulcer to the periumbilical area which he has had for 2 weeks. he had a similar problem in January of this year and the ulcer healed with offloading and local care History of Present Illness (HPI) The following HPI elements were documented for the patient's wound: Location: periumbilical area. is on the left of the midline Quality: Patient reports No Pain. Severity: Patient states wound (s) are getting  better. Duration: Patient has had the wound for < 2 weeks prior to presenting for treatment Context: The wound appeared gradually over time Modifying Factors: Patient is currently on renal dialysis and receives treatments 3 times weekly Associated Signs and Symptoms: Patient reports having:then local care with hydrogen peroxide and Bactroban ointment 76 year old gentleman seen earlier this year in January for a abdominal wound is now back with the same problem which has recurred for about 2 weeks and he has been trying to apply some local antibiotic ointment and a Band-Aid there. Of note he has significant pressure in this area due to his beltline and may have put on some weight which results in his trousers being very tight around the waist. The else has changed in his HandP and his diabetes is pretty well controlled. most recent hemoglobin A1c on 12/06/2016 was 7.8%. he was reviewed by his endocrinologist Dr. Dwyane Dee who made appropriate modifications to his treatment plan. he was recently seen by neurology for mild cognitive impairment that was found to be normal for his age 66/21/18 patient appears to be doing well in regard to his abdominal wound area on evaluation today. He has been tolerating the dressing changes with the [facility] Dressing I'm pleased with how this has progressed and in fact the wound appears to be completely healed on evaluation today. There is definitely no evidence of infection. 03/29/17 Patient's wound on the abdominal region appears to be completely healed on evaluation today. This wound has been doing very well and I think the biggest thing is going to be keeping it from reactivation where it rubbed on his pants. 04/18/2017 -- he was recently discharged about 3 weeks ago and was using a binder which probably caused him abrasion on the area of the recently healed scar. [name], [name] ([id number]) ====== Old notes The 76 year old gentleman who has a past  medical history of diabetes mellitus, end-stage renal disease on dialysis, hypertension, secondary hyperparathyroidism, peripheral vascular disease and history of previous arterial bifemoral bypass graft in 1992 also has a history of gout, hyperlipidemia, hypothyroidism. last hemoglobin A1c was 7.5%. status post AV fistula placement, breast surgery for left granulomatous mastitis, triple a repair in 1992, small bowel obstruction, upper arm angiograms for AV fistula on the left side. was noted to have a superficial skin wound in the periumbilical area since late December 2017. He was asked to clean it with hydrogen peroxide and  apply Bactroban. He was seen by vascular surgery in October 2017 where his right ABI was 0.56 left ABI was 0.87 and right TBI was 0.69 and left TBI was 0.70 ===== Objective Constitutional Pulse regular. Respirations normal and unlabored. Afebrile. Vitals Time Taken: 4:05 PM, Height: 72 in, Weight: 201 lbs, BMI: 27.3, Temperature: 97.5 F, Pulse: 66 bpm, Respiratory Rate: 17 breaths/min, Blood Pressure: 135/53 mmHg. Eyes Nonicteric. Reactive to light. Ears, Nose, Mouth, and Throat Lips, teeth, and gums WNL.Marland Kitchen Moist mucosa without lesions. Neck supple and nontender. No palpable supraclavicular or cervical adenopathy. Normal sized without goiter. Respiratory WNL. No retractions.. Breath sounds WNL, No rubs, rales, rhonchi, or wheeze.. Cardiovascular Heart rhythm and rate regular, no murmur or gallop.. Pedal Pulses WNL. No clubbing, cyanosis or edema. Chest Breasts symmetical and no nipple discharge.. Breast tissue WNL, no masses, lumps, or tenderness.. Lymphatic No adneopathy. No adenopathy. No adenopathy. [name], [name] ([id number]) Musculoskeletal Adexa without tenderness or enlargement.. Digits and nails w/o clubbing, cyanosis, infection, petechiae, ischemia, or inflammatory conditions.Marland Kitchen Psychiatric Judgement and insight Intact.. No evidence of depression,  anxiety, or agitation.. General Notes: the patient was examined in the standing position and he has a deep crevice, because of his surgical scar and this I believe interferes with his trousers, causing an abrasion on the scar tissue and causing a superficial ulceration. It almost looks like a blister which has opened out. No sharp debridement was required Integumentary (Hair, Skin) No suspicious lesions. No crepitus or fluctuance. No peri-wound warmth or erythema. No masses.. Wound #3 status is Open. Original cause of wound was Gradually Appeared. The wound is located on the Medial Abdomen - midline. The wound measures 1.3cm length x 1.4cm width x 0.1cm depth; 1.429cm^2 area and 0.143cm^3 volume. Assessment Active Problems ICD-10 E11.622 - Type 2 diabetes mellitus with other skin ulcer S31.102D - Unspecified open wound of abdominal wall, epigastric region without penetration into peritoneal cavity, subsequent encounter Z99.2 - Dependence on renal dialysis I have spent some time discussing with patient how we can offload this area and have recommended a piece of foam covered by a couple of gauze pieces and a tape across this to fill in the hollow created by scar tissue. I have asked him to do this as often as required and he is going to be away on vacation. He will come back and see me in 2 weeks' time when he is back. Plan Wound Cleansing: Wound #3 Medial Abdomen - midline: Cleanse wound with mild soap and water [name], [name] ([id number]) Primary Wound Dressing: Wound #3 Medial Abdomen - midline: Foam Secondary Dressing: Wound #3 Medial Abdomen - midline: Dry Gauze Dressing Change Frequency: Wound #3 Medial Abdomen - midline: Change dressing every day. Follow-up Appointments: Wound #3 Medial Abdomen - midline: Return Appointment in 2 weeks. I have spent some time discussing with patient how we can offload this area and have recommended a piece of foam covered by a couple of  gauze pieces and a tape across this to fill in the hollow created by scar tissue. I have asked him to do this as often as required and he is going to be away on vacation. He will come back and see me in 2 weeks' time when he is back. Electronic Signature(s) Signed: 04/18/2017 4:26:19 PM By: Christin Fudge MD, FACS Entered By: Christin Fudge on 04/18/2017 16:26:19 Senat, [name] ([id number]) -------------------------------------------------------------------------------- SuperBill Details Patient Name: [name]. Date of Service: 04/18/2017 Medical Record Number: [id number] Patient Account Number: [id number]  Date of Birth/Sex: 1941/02/27 (76 y.o. Male) Treating RN: Baruch Gouty, RN, BSN, Velva Harman Primary Care Provider: Tedra Senegal Other Clinician: Referring Provider: Tedra Senegal Treating Provider/Extender: Frann Rider in Treatment: 11 Diagnosis Coding ICD-10 Codes Code Description E11.622 Type 2 diabetes mellitus with other skin ulcer Unspecified open wound of abdominal wall, epigastric region without penetration into S31.102D peritoneal cavity, subsequent encounter Z99.2 Dependence on renal dialysis Facility Procedures CPT4 Code: [id number] Description: [phone] - WOUND CARE VISIT-LEV 2 EST PT Modifier: Quantity: 1 Physician Procedures CPT4: Description Modifier Quantity Code [id number] - WC PHYS LEVEL 3 - EST PT 1 ICD-10 Description Diagnosis E11.622 Type 2 diabetes mellitus with other skin ulcer S31.102D Unspecified open wound of abdominal wall, epigastric region without  penetration into peritoneal cavity, subsequent encounter Z99.2 Dependence on renal dialysis Electronic Signature(s) Signed: 04/18/2017 4:26:33 PM By: Christin Fudge MD, FACS Entered By: Christin Fudge on 04/18/2017 16:26:32

## 2017-04-22 DIAGNOSIS — N2581 Secondary hyperparathyroidism of renal origin: Secondary | ICD-10-CM | POA: Diagnosis not present

## 2017-04-22 DIAGNOSIS — N186 End stage renal disease: Secondary | ICD-10-CM | POA: Diagnosis not present

## 2017-04-22 DIAGNOSIS — D631 Anemia in chronic kidney disease: Secondary | ICD-10-CM | POA: Diagnosis not present

## 2017-04-22 DIAGNOSIS — E1129 Type 2 diabetes mellitus with other diabetic kidney complication: Secondary | ICD-10-CM | POA: Diagnosis not present

## 2017-04-22 NOTE — Progress Notes (Signed)
[name] ([id number]) Visit Report for 04/18/2017 Arrival Information Details Patient Name: [name], [name]. Date of Service: 04/18/2017 3:45 PM Medical Record Number: [id number] Patient Account Number: [id number] Date of Birth/Sex: 10-20-1940 (76 y.o. Male) Treating RN: Johnny Gouty, RN, BSN, [name] Primary Care [name]: [name] Other Clinician: Referring [name]: [name] Treating [name]/Extender: [name] in Treatment: 11 Visit Information History Since Last Visit All ordered tests and consults were completed: No Patient Arrived: Ambulatory Added or deleted any medications: No Arrival Time: 16:00 Any new allergies or adverse reactions: No Accompanied By: self Had a fall or experienced change in No Transfer Assistance: None activities of daily living that may affect Patient Identification Verified: Yes risk of falls: Secondary Verification Process Yes Signs or symptoms of abuse/neglect since last No Completed: visito Patient Has Alerts: Yes Hospitalized since last visit: No Patient Alerts: Patient on Blood Has Dressing in Place as Prescribed: Yes Thinner Pain Present Now: No DMII aspirin 650mg  Electronic Signature(s) Signed: 04/18/2017 4:40:57 PM By: [name] BSN, RN Entered By: [name] on 04/18/2017 16:01:07 [name], [name] ([id number]) -------------------------------------------------------------------------------- Clinic Level of Care Assessment Details Patient Name: [name]. Date of Service: 04/18/2017 3:45 PM Medical Record Number: [id number] Patient Account Number: [id number] Date of Birth/Sex: Apr 11, 1941 (76 y.o. Male) Treating RN: Johnny Gouty, RN, BSN, [name] Primary Care [name]: [name] Other Clinician: Referring Kiandra Sanguinetti: [name] Treating [name]/Extender: [name] in Treatment: 11 Clinic Level of Care Assessment Items TOOL 4 Quantity Score []  - Use when only an EandM is performed on FOLLOW-UP visit  0 ASSESSMENTS - Nursing Assessment / Reassessment X - Reassessment of Co-morbidities (includes updates in patient status) 1 10 X - Reassessment of Adherence to Treatment Plan 1 5 ASSESSMENTS - Wound and Skin Assessment / Reassessment X - Simple Wound Assessment / Reassessment - one wound 1 5 []  - Complex Wound Assessment / Reassessment - multiple wounds 0 []  - Dermatologic / Skin Assessment (not related to wound area) 0 ASSESSMENTS - Focused Assessment []  - Circumferential Edema Measurements - multi extremities 0 []  - Nutritional Assessment / Counseling / Intervention 0 []  - Lower Extremity Assessment (monofilament, tuning fork, pulses) 0 []  - Peripheral Arterial Disease Assessment (using hand held doppler) 0 ASSESSMENTS - Ostomy and/or Continence Assessment and Care []  - Incontinence Assessment and Management 0 []  - Ostomy Care Assessment and Management (repouching, etc.) 0 PROCESS - Coordination of Care X - Simple Patient / Family Education for ongoing care 1 15 []  - Complex (extensive) Patient / Family Education for ongoing care 0 []  - Staff obtains Programmer, systems, Records, Test Results / Process Orders 0 []  - Staff telephones HHA, Nursing Homes / Clarify orders / etc 0 []  - Routine Transfer to another Facility (non-emergent condition) 0 Chura, [name] ([id number]) []  - Routine Hospital Admission (non-emergent condition) 0 []  - New Admissions / Biomedical engineer / Ordering NPWT, Apligraf, etc. 0 []  - Emergency Hospital Admission (emergent condition) 0 []  - Simple Discharge Coordination 0 []  - Complex (extensive) Discharge Coordination 0 PROCESS - Special Needs []  - Pediatric / Minor Patient Management 0 []  - Isolation Patient Management 0 []  - Hearing / Language / Visual special needs 0 []  - Assessment of Community assistance (transportation, D/C planning, etc.) 0 []  - Additional assistance / Altered mentation 0 []  - Support Surface(s) Assessment (bed, cushion, seat, etc.)  0 INTERVENTIONS - Wound Cleansing / Measurement X - Simple Wound Cleansing - one wound 1 5 []  - Complex Wound Cleansing - multiple  wounds 0 X - Wound Imaging (photographs - any number of wounds) 1 5 []  - Wound Tracing (instead of photographs) 0 X - Simple Wound Measurement - one wound 1 5 []  - Complex Wound Measurement - multiple wounds 0 INTERVENTIONS - Wound Dressings X - Small Wound Dressing one or multiple wounds 1 10 []  - Medium Wound Dressing one or multiple wounds 0 []  - Large Wound Dressing one or multiple wounds 0 []  - Application of Medications - topical 0 []  - Application of Medications - injection 0 INTERVENTIONS - Miscellaneous []  - External ear exam 0 [name], [name] ([id number]) []  - Specimen Collection (cultures, biopsies, blood, body fluids, etc.) 0 []  - Specimen(s) / Culture(s) sent or taken to Lab for analysis 0 []  - Patient Transfer (multiple staff / [name] Lift / Similar devices) 0 []  - Simple Staple / Suture removal (25 or less) 0 []  - Complex Staple / Suture removal (26 or more) 0 []  - Hypo / Hyperglycemic Management (close monitor of Blood Glucose) 0 []  - Ankle / Brachial Index (ABI) - do not check if billed separately 0 X - Vital Signs 1 5 Has the patient been seen at the hospital within the last three years: Yes Total Score: 65 Level Of Care: New/Established - Level 2 Electronic Signature(s) Signed: 04/18/2017 4:40:57 PM By: [name] BSN, RN Entered By: [name] on 04/18/2017 16:13:30 Sledge, [name] ([id number]) -------------------------------------------------------------------------------- Encounter Discharge Information Details Patient Name: [name]. Date of Service: 04/18/2017 3:45 PM Medical Record Number: [id number] Patient Account Number: [id number] Date of Birth/Sex: Feb 17, 1941 (76 y.o. Male) Treating RN: Johnny Gouty, RN, BSN, [name] Primary Care [name]: [name] Other Clinician: Referring [name]: [name] Treating  [name]/Extender: [name] in Treatment: 11 Encounter Discharge Information Items Discharge Pain Level: 0 Discharge Condition: Stable Ambulatory Status: Cane Discharge Destination: Home Transportation: Private Auto Schedule Follow-up Appointment: No Medication Reconciliation completed No and provided to Patient/Care Edina Winningham: Provided on Clinical Summary of Care: 04/18/2017 Form Type Recipient Paper Patient WT Electronic Signature(s) Signed: 04/18/2017 4:38:47 PM By: [name] BSN, RN Previous Signature: 04/18/2017 4:24:07 PM Version By: Ruthine Dose Entered By: [name] on 04/18/2017 16:38:47 Vuolo, [name] ([id number]) -------------------------------------------------------------------------------- Lower Extremity Assessment Details Patient Name: [name]. Date of Service: 04/18/2017 3:45 PM Medical Record Number: [id number] Patient Account Number: [id number] Date of Birth/Sex: 1941/03/24 (76 y.o. Male) Treating RN: Johnny Gouty, RN, BSN, [place] Primary Care Xiara Knisley: [name] Other Clinician: Referring Musab Wingard: [name] Treating Jermel Artley/Extender: [name] in Treatment: 11 Electronic Signature(s) Signed: 04/18/2017 4:40:57 PM By: [name] BSN, RN Entered By: [name] on 04/18/2017 16:01:22 Hassey, [name] ([id number]) -------------------------------------------------------------------------------- Multi Wound Chart Details Patient Name: [name]. Date of Service: 04/18/2017 3:45 PM Medical Record Number: [id number] Patient Account Number: [id number] Date of Birth/Sex: 1941-07-17 (76 y.o. Male) Treating RN: Johnny Gouty, RN, BSN, [name] Primary Care Matalyn Nawaz: [name] Other Clinician: Referring Florencio Hollibaugh: [name] Treating Larz Mark/Extender: [name] in Treatment: 11 Vital Signs Height(in): 72 Pulse(bpm): 66 Weight(lbs): 201 Blood Pressure 135/53 (mmHg): Body Mass Index(BMI): 27 Temperature(F):  97.5 Respiratory Rate 17 (breaths/min): Photos: [3:No Photos] [N/A:N/A] Wound Location: [3:Medial Abdomen - midline N/A] Wounding Event: [3:Gradually Appeared] [N/A:N/A] Primary Etiology: [3:To be determined] [N/A:N/A] Date Acquired: [3:04/14/2017] [N/A:N/A] Weeks of Treatment: [3:0] [N/A:N/A] Wound Status: [3:Open] [N/A:N/A] Measurements L x W x D 1.3x1.4x0.1 [N/A:N/A] (cm) Area (cm) : [3:1.429] [N/A:N/A] Volume (cm) : [3:0.143] [N/A:N/A] Periwound Skin Texture: No Abnormalities Noted N/A Periwound Skin [3:No Abnormalities Noted  N/A] Moisture: Periwound Skin Color: No Abnormalities Noted N/A Tenderness on [3:No] [N/A:N/A] Treatment Notes Wound #3 (Medial Abdomen - midline) 1. Cleansed with: Clean wound with Normal Saline 4. Dressing Applied: Foam 5. Secondary Dressing Applied Dry Gauze 7. Secured with Tape [name], [name] ([id number]) Electronic Signature(s) Signed: 04/18/2017 4:23:12 PM By: Christin Fudge MD, FACS Entered By: Christin Fudge on 04/18/2017 16:23:12 Negron, [name] ([id number]) -------------------------------------------------------------------------------- [place] Details Patient Name: [name]. Date of Service: 04/18/2017 3:45 PM Medical Record Number: [id number] Patient Account Number: [id number] Date of Birth/Sex: 16-May-1941 (76 y.o. Male) Treating RN: Johnny Gouty, RN, BSN, [place] Primary Care Velecia Ovitt: [name] Other Clinician: Referring Evony Rezek: [name] Treating Cheris Tweten/Extender: [name] in Treatment: 11 Active Inactive ` Orientation to the Wound Care Program Nursing Diagnoses: Knowledge deficit related to the wound healing center program Goals: Patient/caregiver will verbalize understanding of the [facility] Program Date Initiated: 01/28/2017 Target Resolution Date: 07/19/2017 Goal Status: Active Interventions: Provide education on orientation to the wound center Notes: Electronic  Signature(s) Signed: 04/18/2017 4:40:57 PM By: [name] BSN, RN Entered By: [name] on 04/18/2017 16:12:04 Wanek, [name] ([id number]) -------------------------------------------------------------------------------- Pain Assessment Details Patient Name: [name]. Date of Service: 04/18/2017 3:45 PM Medical Record Number: [id number] Patient Account Number: [id number] Date of Birth/Sex: [birth year] (76 y.o. Male) Treating RN: Johnny Gouty, RN, BSN, [name] Primary Care Donavon Kimrey: [name] Other Clinician: Referring Ayson Cherubini: [name] Treating Lindsi Bayliss/Extender: [name] in Treatment: 11 Active Problems Location of Pain Severity and Description of Pain Patient Has Paino No Site Locations With Dressing Change: No Pain Management and Medication Current Pain Management: Electronic Signature(s) Signed: 04/18/2017 4:40:57 PM By: [name] BSN, RN Entered By: [name] on 04/18/2017 16:01:13 Hansley, [name] ([id number]) -------------------------------------------------------------------------------- Patient/Caregiver Education Details Patient Name: [name]. Date of Service: 04/18/2017 3:45 PM Medical Record Number: [id number] Patient Account Number: [id number] Date of Birth/Gender: [birth year] (76 y.o. Male) Treating RN: Johnny Gouty, RN, BSN, [name] Primary Care Physician: [name] Other Clinician: Referring Physician: Tedra [name] Treating Physician/Extender: [name] in Treatment: 11 Education Assessment Education Provided To: Patient Education Topics Provided Welcome To [facility]: Methods: Explain/Verbal Responses: Reinforcements needed Electronic Signature(s) Signed: 04/18/2017 4:40:57 PM By: [name] BSN, RN Entered By: [name] on 04/18/2017 16:39:00 Obando, [name] ([id number]) -------------------------------------------------------------------------------- Wound Assessment Details Patient Name: [name]. Date  of Service: 04/18/2017 3:45 PM Medical Record Number: [id number] Patient Account Number: [id number] Date of Birth/Sex: [birth year] (76 y.o. Male) Treating RN: Johnny Gouty, RN, BSN, [name] Primary Care Tishina Lown: [name] Other Clinician: Referring Manon Banbury: [name] Treating Josh Nicolosi/Extender: [name] in Treatment: 11 Wound Status Wound Number: 3 Primary Etiology: To be determined Wound Location: Medial Abdomen - midline Wound Status: Open Wounding Event: Gradually Appeared Date Acquired: 04/14/2017 Weeks Of Treatment: 0 Clustered Wound: No Photos Photo Uploaded By: [name] on 04/18/2017 16:50:09 Wound Measurements Length: (cm) 1.3 % Reduction i Width: (cm) 1.4 % Reduction i Depth: (cm) 0.1 Area: (cm) 1.429 Volume: (cm) 0.143 n Area: n Volume: Periwound Skin Texture Texture Color No Abnormalities Noted: No No Abnormalities Noted: No Moisture No Abnormalities Noted: No Hemm, [name] ([id number]) Treatment Notes Wound #3 (Medial Abdomen - midline) 1. Cleansed with: Clean wound with Normal Saline 4. Dressing Applied: Foam 5. Secondary Dressing Applied Dry Gauze 7. Secured with Recruitment consultant) Signed: 04/18/2017 4:40:57 PM By: [name] BSN, RN Entered By: [name] on 04/18/2017 16:03:32 Mander, [name] ([id number]) --------------------------------------------------------------------------------  Vitals Details Patient Name: [name], [name]. Date of Service: 04/18/2017 3:45 PM Medical Record Number: [id number] Patient Account Number: [id number] Date of Birth/Sex: [birth year] (76 y.o. Male) Treating RN: Afful, RN, BSN, [place] Primary Care Vernadine Coombs: [name] Other Clinician: Referring Farah Lepak: [name] Treating Ramirez Fullbright/Extender: [name] in Treatment: 11 Vital Signs Time Taken: 16:05 Temperature (F): 97.5 Height (in): 72 Pulse (bpm): 66 Weight (lbs): 201 Respiratory Rate (breaths/min): 17 Body Mass Index  (BMI): 27.3 Blood Pressure (mmHg): 135/53 Reference Range: 80 - 120 mg / dl Electronic Signature(s) Signed: 04/18/2017 4:40:57 PM By: [name] BSN, RN Entered By: [name] on 04/18/2017 16:08:51

## 2017-04-23 DIAGNOSIS — E1122 Type 2 diabetes mellitus with diabetic chronic kidney disease: Secondary | ICD-10-CM | POA: Diagnosis not present

## 2017-04-23 DIAGNOSIS — N186 End stage renal disease: Secondary | ICD-10-CM | POA: Diagnosis not present

## 2017-04-23 DIAGNOSIS — Z992 Dependence on renal dialysis: Secondary | ICD-10-CM | POA: Diagnosis not present

## 2017-04-25 DIAGNOSIS — N2581 Secondary hyperparathyroidism of renal origin: Secondary | ICD-10-CM | POA: Diagnosis not present

## 2017-04-25 DIAGNOSIS — N186 End stage renal disease: Secondary | ICD-10-CM | POA: Diagnosis not present

## 2017-04-27 DIAGNOSIS — N2581 Secondary hyperparathyroidism of renal origin: Secondary | ICD-10-CM | POA: Diagnosis not present

## 2017-04-27 DIAGNOSIS — N186 End stage renal disease: Secondary | ICD-10-CM | POA: Diagnosis not present

## 2017-04-30 DIAGNOSIS — N186 End stage renal disease: Secondary | ICD-10-CM | POA: Diagnosis not present

## 2017-04-30 DIAGNOSIS — N2581 Secondary hyperparathyroidism of renal origin: Secondary | ICD-10-CM | POA: Diagnosis not present

## 2017-05-02 ENCOUNTER — Encounter: Payer: Medicare Other | Attending: Physician Assistant | Admitting: Physician Assistant

## 2017-05-02 DIAGNOSIS — Z79899 Other long term (current) drug therapy: Secondary | ICD-10-CM | POA: Diagnosis not present

## 2017-05-02 DIAGNOSIS — Z992 Dependence on renal dialysis: Secondary | ICD-10-CM | POA: Insufficient documentation

## 2017-05-02 DIAGNOSIS — E1122 Type 2 diabetes mellitus with diabetic chronic kidney disease: Secondary | ICD-10-CM | POA: Insufficient documentation

## 2017-05-02 DIAGNOSIS — Z880 Allergy status to penicillin: Secondary | ICD-10-CM | POA: Diagnosis not present

## 2017-05-02 DIAGNOSIS — N2581 Secondary hyperparathyroidism of renal origin: Secondary | ICD-10-CM | POA: Diagnosis not present

## 2017-05-02 DIAGNOSIS — X58XXXD Exposure to other specified factors, subsequent encounter: Secondary | ICD-10-CM | POA: Diagnosis not present

## 2017-05-02 DIAGNOSIS — N186 End stage renal disease: Secondary | ICD-10-CM | POA: Insufficient documentation

## 2017-05-02 DIAGNOSIS — S31102D Unspecified open wound of abdominal wall, epigastric region without penetration into peritoneal cavity, subsequent encounter: Secondary | ICD-10-CM | POA: Insufficient documentation

## 2017-05-02 DIAGNOSIS — Z794 Long term (current) use of insulin: Secondary | ICD-10-CM | POA: Insufficient documentation

## 2017-05-02 DIAGNOSIS — D509 Iron deficiency anemia, unspecified: Secondary | ICD-10-CM | POA: Diagnosis not present

## 2017-05-02 DIAGNOSIS — E1140 Type 2 diabetes mellitus with diabetic neuropathy, unspecified: Secondary | ICD-10-CM | POA: Insufficient documentation

## 2017-05-02 DIAGNOSIS — Z7982 Long term (current) use of aspirin: Secondary | ICD-10-CM | POA: Diagnosis not present

## 2017-05-02 DIAGNOSIS — Z87891 Personal history of nicotine dependence: Secondary | ICD-10-CM | POA: Insufficient documentation

## 2017-05-02 DIAGNOSIS — D649 Anemia, unspecified: Secondary | ICD-10-CM | POA: Diagnosis not present

## 2017-05-02 DIAGNOSIS — D631 Anemia in chronic kidney disease: Secondary | ICD-10-CM | POA: Diagnosis not present

## 2017-05-02 DIAGNOSIS — E11622 Type 2 diabetes mellitus with other skin ulcer: Secondary | ICD-10-CM | POA: Diagnosis not present

## 2017-05-02 DIAGNOSIS — I120 Hypertensive chronic kidney disease with stage 5 chronic kidney disease or end stage renal disease: Secondary | ICD-10-CM | POA: Diagnosis not present

## 2017-05-02 DIAGNOSIS — M109 Gout, unspecified: Secondary | ICD-10-CM | POA: Diagnosis not present

## 2017-05-02 DIAGNOSIS — E039 Hypothyroidism, unspecified: Secondary | ICD-10-CM | POA: Diagnosis not present

## 2017-05-02 DIAGNOSIS — E1129 Type 2 diabetes mellitus with other diabetic kidney complication: Secondary | ICD-10-CM | POA: Diagnosis not present

## 2017-05-02 DIAGNOSIS — E785 Hyperlipidemia, unspecified: Secondary | ICD-10-CM | POA: Insufficient documentation

## 2017-05-02 DIAGNOSIS — Z23 Encounter for immunization: Secondary | ICD-10-CM | POA: Diagnosis not present

## 2017-05-02 DIAGNOSIS — L98492 Non-pressure chronic ulcer of skin of other sites with fat layer exposed: Secondary | ICD-10-CM | POA: Diagnosis not present

## 2017-05-03 DIAGNOSIS — D631 Anemia in chronic kidney disease: Secondary | ICD-10-CM | POA: Diagnosis not present

## 2017-05-03 DIAGNOSIS — E1129 Type 2 diabetes mellitus with other diabetic kidney complication: Secondary | ICD-10-CM | POA: Diagnosis not present

## 2017-05-03 DIAGNOSIS — N2581 Secondary hyperparathyroidism of renal origin: Secondary | ICD-10-CM | POA: Diagnosis not present

## 2017-05-03 DIAGNOSIS — N186 End stage renal disease: Secondary | ICD-10-CM | POA: Diagnosis not present

## 2017-05-03 DIAGNOSIS — D509 Iron deficiency anemia, unspecified: Secondary | ICD-10-CM | POA: Diagnosis not present

## 2017-05-03 DIAGNOSIS — Z23 Encounter for immunization: Secondary | ICD-10-CM | POA: Diagnosis not present

## 2017-05-04 NOTE — Progress Notes (Signed)
[name] ([id number]) Visit Report for 05/02/2017 Chief Complaint Document Details Patient Name: [name], [name]. Date of Service: 05/02/2017 9:15 AM Medical Record Number: [id number] Patient Account Number: [id number] Date of Birth/Sex: [birth year] (76 y.o. Male) Treating RN: Montey Hora Primary Care Provider: Tedra Senegal Other Clinician: Referring Provider: Tedra Senegal Treating Provider/Extender: [name], HOYT Weeks in Treatment: 13 Information Obtained from: Patient Chief Complaint Patients presents for treatment of an open diabetic ulcer to the periumbilical area. he had a similar problem in January of this year and the ulcer healed with offloading and local care Electronic Signature(s) Signed: 05/03/2017 10:18:42 AM By: Worthy Keeler PA-C Entered By: Worthy Keeler on 05/02/2017 10:08:56 [name] ([id number]) -------------------------------------------------------------------------------- HPI Details Patient Name: [name]. Date of Service: 05/02/2017 9:15 AM Medical Record Number: [id number] Patient Account Number: [id number] Date of Birth/Sex: Apr 02, 1941 (76 y.o. Male) Treating RN: Montey Hora Primary Care Provider: Tedra Senegal Other Clinician: Referring Provider: Tedra Senegal Treating Provider/Extender: [name], HOYT Weeks in Treatment: 13 History of Present Illness Location: periumbilical area. is on the left of the midline Quality: Patient reports No Pain. Severity: Patient states wound (s) are getting better. Duration: Patient has had the wound for < 2 weeks prior to presenting for treatment Context: The wound appeared gradually over time Modifying Factors: Patient is currently on renal dialysis and receives treatments 3 times weekly Associated Signs and Symptoms: Patient reports having:then local care with hydrogen peroxide and Bactroban ointment HPI Description: 76 year old gentleman seen earlier this year in January for a abdominal  wound is now back with the same problem which has recurred for about 2 weeks and he has been trying to apply some local antibiotic ointment and a Band-Aid there. Of note he has significant pressure in this area due to his beltline and may have put on some weight which results in his trousers being very tight around the waist. The else has changed in his HandP and his diabetes is pretty well controlled. most recent hemoglobin A1c on 12/06/2016 was 7.8%. he was reviewed by his endocrinologist Dr. Dwyane Dee who made appropriate modifications to his treatment plan. he was recently seen by neurology for mild cognitive impairment that was found to be normal for his age 88/21/18 patient appears to be doing well in regard to his abdominal wound area on evaluation today. He has been tolerating the dressing changes with the [facility] Dressing I'm pleased with how this has progressed and in fact the wound appears to be completely healed on evaluation today. There is definitely no evidence of infection. 03/29/17 Patient's wound on the abdominal region appears to be completely healed on evaluation today. This wound has been doing very well and I think the biggest thing is going to be keeping it from reactivation where it rubbed on his pants. 04/18/2017 -- he was recently discharged about 3 weeks ago and was using a binder which probably caused him abrasion on the area of the recently healed scar. 05/02/17 on evaluation today patient appears to be doing fairly well in regard to his abdominal wound at the site of the surgical scar. Unfortunately the scar tissue is very fragile and tends to reopen. He did get a small abdominal binder to try to pad the area in order to offload and prevent his pants from rubbing specifically at the site. With that being said he tells me that Dr. Con Memos was concerned that the binder might actually have rub the area causing a reopening. I did  have a look at this today and unfortunately  it appears that he was putting this over his pants which was making matters worse. ====== Old notes The 76 year old gentleman who has a past medical history of diabetes mellitus, end-stage renal disease on [place], [name] ([id number]) dialysis, hypertension, secondary hyperparathyroidism, peripheral vascular disease and history of previous arterial bifemoral bypass graft in 1992 also has a history of gout, hyperlipidemia, hypothyroidism. last hemoglobin A1c was 7.5%. status post AV fistula placement, breast surgery for left granulomatous mastitis, triple a repair in 1992, small bowel obstruction, upper arm angiograms for AV fistula on the left side. was noted to have a superficial skin wound in the periumbilical area since late December 2017. He was asked to clean it with hydrogen peroxide and apply Bactroban. He was seen by vascular surgery in October 2017 where his right ABI was 0.56 left ABI was 0.87 and right TBI was 0.69 and left TBI was 0.70 ===== Electronic Signature(s) Signed: 05/03/2017 10:18:42 AM By: Worthy Keeler PA-C Entered By: Worthy Keeler on 05/03/2017 08:15:45 [name] ([id number]) -------------------------------------------------------------------------------- Physical Exam Details Patient Name: [name]. Date of Service: 05/02/2017 9:15 AM Medical Record Number: [id number] Patient Account Number: [id number] Date of Birth/Sex: 03-25-1941 (76 y.o. Male) Treating RN: Montey Hora Primary Care Provider: Tedra Senegal Other Clinician: Referring Provider: Tedra Senegal Treating Provider/Extender: [name], HOYT Weeks in Treatment: 52 Constitutional Well-nourished and well-hydrated in no acute distress. Respiratory normal breathing without difficulty. clear to auscultation bilaterally. Cardiovascular regular rate and rhythm with normal S1, S2. Psychiatric this patient is able to make decisions and demonstrates good insight into disease process. Alert  and Oriented x 3. pleasant and cooperative. Notes Patient's wound at this point appears to have an excellent granulation bed though unfortunately is still open. Is not having any significant discomfort but unfortunately this is right at the line where his pants button and therefore rub at this location. No debridement required. Electronic Signature(s) Signed: 05/03/2017 10:18:42 AM By: Worthy Keeler PA-C Entered By: Worthy Keeler on 05/03/2017 08:16:38 [place], Talmage [name] ([id number]) -------------------------------------------------------------------------------- Physician Orders Details Patient Name: [name]. Date of Service: 05/02/2017 9:15 AM Medical Record Number: [id number] Patient Account Number: [id number] Date of Birth/Sex: 10-11-1940 (76 y.o. Male) Treating RN: Montey Hora Primary Care Provider: Tedra Senegal Other Clinician: Referring Provider: Tedra Senegal Treating Provider/Extender: [name], HOYT Weeks in Treatment: 67 Verbal / Phone Orders: No Diagnosis Coding ICD-10 Coding Code Description E11.622 Type 2 diabetes mellitus with other skin ulcer Unspecified open wound of abdominal wall, epigastric region without penetration into S31.102D peritoneal cavity, subsequent encounter Z99.2 Dependence on renal dialysis Wound Cleansing Wound #3 Medial Abdomen - midline o Cleanse wound with mild soap and water Primary Wound Dressing Wound #3 Medial Abdomen - midline o Hydrafera Blue Secondary Dressing Wound #3 Medial Abdomen - midline o Dry Gauze Dressing Change Frequency Wound #3 Medial Abdomen - midline o Change dressing every day. Follow-up Appointments Wound #3 Medial Abdomen - midline o Return Appointment in 2 weeks. Notes I'm going to recommend that we continue with the Current wound care measures for the next week. I did suggest after seeing his the domino binder and how he was placed in the phone as well as trying to place it appropriately  that he just avoid the use of this as I feel it's going to make things worse. I did suggest that he may want to keep his pants situated at a lower position in order  to avoid rubbing at the location of the surgical scar. He states this is something that he does believe he will be able to do. We will see him for reevaluation in one week to see were things stand at that point. LORAIN, KEAST ([id number]) Electronic Signature(s) Signed: 05/03/2017 10:18:42 AM By: Worthy Keeler PA-C Previous Signature: 05/02/2017 4:54:09 PM Version By: Montey Hora Entered By: Worthy Keeler on 05/03/2017 08:17:47 Baquero, Talmage [name] ([id number]) -------------------------------------------------------------------------------- Problem List Details Patient Name: [name]. Date of Service: 05/02/2017 9:15 AM Medical Record Number: [id number] Patient Account Number: [id number] Date of Birth/Sex: 03/27/1941 (76 y.o. Male) Treating RN: Montey Hora Primary Care Provider: Tedra Senegal Other Clinician: Referring Provider: Tedra Senegal Treating Provider/Extender: Worthy Keeler Weeks in Treatment: 13 Active Problems ICD-10 Encounter Code Description Active Date Diagnosis E11.622 Type 2 diabetes mellitus with other skin ulcer 01/28/2017 Yes S31.102D Unspecified open wound of abdominal wall, epigastric 01/28/2017 Yes region without penetration into peritoneal cavity, subsequent encounter Z99.2 Dependence on renal dialysis 01/28/2017 Yes Inactive Problems Resolved Problems Electronic Signature(s) Signed: 05/03/2017 10:18:42 AM By: Worthy Keeler PA-C Entered By: Worthy Keeler on 05/02/2017 09:53:41 Munoz, Talmage [name] ([id number]) -------------------------------------------------------------------------------- Progress Note Details Patient Name: [name]. Date of Service: 05/02/2017 9:15 AM Medical Record Number: [id number] Patient Account Number: [id number] Date of Birth/Sex: 01-15-1941 (76 y.o.  Male) Treating RN: Montey Hora Primary Care Provider: Tedra Senegal Other Clinician: Referring Provider: Tedra Senegal Treating Provider/Extender: [name], HOYT Weeks in Treatment: 13 Subjective Chief Complaint Information obtained from Patient Patients presents for treatment of an open diabetic ulcer to the periumbilical area. he had a similar problem in January of this year and the ulcer healed with offloading and local care History of Present Illness (HPI) The following HPI elements were documented for the patient's wound: Location: periumbilical area. is on the left of the midline Quality: Patient reports No Pain. Severity: Patient states wound (s) are getting better. Duration: Patient has had the wound for < 2 weeks prior to presenting for treatment Context: The wound appeared gradually over time Modifying Factors: Patient is currently on renal dialysis and receives treatments 3 times weekly Associated Signs and Symptoms: Patient reports having:then local care with hydrogen peroxide and Bactroban ointment 76 year old gentleman seen earlier this year in January for a abdominal wound is now back with the same problem which has recurred for about 2 weeks and he has been trying to apply some local antibiotic ointment and a Band-Aid there. Of note he has significant pressure in this area due to his beltline and may have put on some weight which results in his trousers being very tight around the waist. The else has changed in his HandP and his diabetes is pretty well controlled. most recent hemoglobin A1c on 12/06/2016 was 7.8%. he was reviewed by his endocrinologist Dr. Dwyane Dee who made appropriate modifications to his treatment plan. he was recently seen by neurology for mild cognitive impairment that was found to be normal for his age 55/21/18 patient appears to be doing well in regard to his abdominal wound area on evaluation today. He has been tolerating the dressing changes with  the [facility] Dressing I'm pleased with how this has progressed and in fact the wound appears to be completely healed on evaluation today. There is definitely no evidence of infection. 03/29/17 Patient's wound on the abdominal region appears to be completely healed on evaluation today. This wound has been doing very well and I think the biggest  thing is going to be keeping it from reactivation where it rubbed on his pants. 04/18/2017 -- he was recently discharged about 3 weeks ago and was using a binder which probably caused him abrasion on the area of the recently healed scar. 05/02/17 on evaluation today patient appears to be doing fairly well in regard to his abdominal wound at the [place]. ([id number]) site of the surgical scar. Unfortunately the scar tissue is very fragile and tends to reopen. He did get a small abdominal binder to try to pad the area in order to offload and prevent his pants from rubbing specifically at the site. With that being said he tells me that Dr. Con Memos was concerned that the binder might actually have rub the area causing a reopening. I did have a look at this today and unfortunately it appears that he was putting this over his pants which was making matters worse. ====== Old notes The 76 year old gentleman who has a past medical history of diabetes mellitus, end-stage renal disease on dialysis, hypertension, secondary hyperparathyroidism, peripheral vascular disease and history of previous arterial bifemoral bypass graft in 1992 also has a history of gout, hyperlipidemia, hypothyroidism. last hemoglobin A1c was 7.5%. status post AV fistula placement, breast surgery for left granulomatous mastitis, triple a repair in 1992, small bowel obstruction, upper arm angiograms for AV fistula on the left side. was noted to have a superficial skin wound in the periumbilical area since late December 2017. He was asked to clean it with hydrogen peroxide and apply  Bactroban. He was seen by vascular surgery in October 2017 where his right ABI was 0.56 left ABI was 0.87 and right TBI was 0.69 and left TBI was 0.70 ===== Objective Constitutional Well-nourished and well-hydrated in no acute distress. Vitals Time Taken: 9:44 AM, Height: 72 in, Weight: 201 lbs, BMI: 27.3, Temperature: 97.8 F, Pulse: 66 bpm, Respiratory Rate: 18 breaths/min, Blood Pressure: 99/65 mmHg. Respiratory normal breathing without difficulty. clear to auscultation bilaterally. Cardiovascular regular rate and rhythm with normal S1, S2. Psychiatric this patient is able to make decisions and demonstrates good insight into disease process. Alert and Oriented x 3. pleasant and cooperative. General Notes: Patient's wound at this point appears to have an excellent granulation bed though unfortunately is still open. Is not having any significant discomfort but unfortunately this is right at the line where his pants button and therefore rub at this location. No debridement required. Alvillar, JEZREEL JUSTINIANO ([id number]) Integumentary (Hair, Skin) Wound #3 status is Open. Original cause of wound was Gradually Appeared. The wound is located on the Medial Abdomen - midline. The wound measures 0.6cm length x 0.8cm width x 0.1cm depth; 0.377cm^2 area and 0.038cm^3 volume. There is no tunneling or undermining noted. There is a large amount of sanguinous drainage noted. The wound margin is flat and intact. There is large (67-100%) red granulation within the wound bed. There is no necrotic tissue within the wound bed. The periwound skin appearance did not exhibit: Callus, Crepitus, Excoriation, Induration, Rash, Scarring, Dry/Scaly, Maceration, Atrophie Blanche, Cyanosis, Ecchymosis, Hemosiderin Staining, Mottled, Pallor, Rubor, Erythema. Periwound temperature was noted as No Abnormality. Assessment Active Problems ICD-10 E11.622 - Type 2 diabetes mellitus with other skin ulcer S31.102D -  Unspecified open wound of abdominal wall, epigastric region without penetration into peritoneal cavity, subsequent encounter Z99.2 - Dependence on renal dialysis Plan Wound Cleansing: Wound #3 Medial Abdomen - midline: Cleanse wound with mild soap and water Primary Wound Dressing: Wound #3 Medial Abdomen - midline:  Hydrafera Blue Secondary Dressing: Wound #3 Medial Abdomen - midline: Dry Gauze Dressing Change Frequency: Wound #3 Medial Abdomen - midline: Change dressing every day. Follow-up Appointments: Wound #3 Medial Abdomen - midline: Return Appointment in 2 weeks. General Notes: I'm going to recommend that we continue with the Current wound care measures for the next week. I did suggest after seeing his the domino binder and how he was placed in the phone as well as trying to place it appropriately that he just avoid the use of this as I feel it's going to make things worse. I did [place], Talmage [name] ([id number]) suggest that he may want to keep his pants situated at a lower position in order to avoid rubbing at the location of the surgical scar. He states this is something that he does believe he will be able to do. We will see him for reevaluation in one week to see were things stand at that point. Electronic Signature(s) Signed: 05/03/2017 10:18:42 AM By: Worthy Keeler PA-C Entered By: Worthy Keeler on 05/03/2017 08:17:55 Demmer, Talmage [name] ([id number]) -------------------------------------------------------------------------------- SuperBill Details Patient Name: [name]. Date of Service: 05/02/2017 Medical Record Number: [id number] Patient Account Number: [id number] Date of Birth/Sex: 09-29-1940 (76 y.o. Male) Treating RN: Montey Hora Primary Care Provider: Tedra Senegal Other Clinician: Referring Provider: Tedra Senegal Treating Provider/Extender: [name], HOYT Weeks in Treatment: 13 Diagnosis Coding ICD-10 Codes Code Description E11.622 Type 2 diabetes  mellitus with other skin ulcer Unspecified open wound of abdominal wall, epigastric region without penetration into S31.102D peritoneal cavity, subsequent encounter Z99.2 Dependence on renal dialysis Facility Procedures CPT4 Code: [id number] Description: [phone] - WOUND CARE VISIT-LEV 2 EST PT Modifier: Quantity: 1 Physician Procedures CPT4: Description Modifier Quantity Code [id number] - WC PHYS LEVEL 3 - EST PT 1 ICD-10 Description Diagnosis E11.622 Type 2 diabetes mellitus with other skin ulcer S31.102D Unspecified open wound of abdominal wall, epigastric region without  penetration into peritoneal cavity, subsequent encounter Z99.2 Dependence on renal dialysis Electronic Signature(s) Signed: 05/03/2017 10:18:42 AM By: Worthy Keeler PA-C Entered By: Worthy Keeler on 05/03/2017 08:18:20

## 2017-05-06 ENCOUNTER — Ambulatory Visit: Payer: Medicare Other | Admitting: Internal Medicine

## 2017-05-06 DIAGNOSIS — D509 Iron deficiency anemia, unspecified: Secondary | ICD-10-CM | POA: Diagnosis not present

## 2017-05-06 DIAGNOSIS — D631 Anemia in chronic kidney disease: Secondary | ICD-10-CM | POA: Diagnosis not present

## 2017-05-06 DIAGNOSIS — Z23 Encounter for immunization: Secondary | ICD-10-CM | POA: Diagnosis not present

## 2017-05-06 DIAGNOSIS — N2581 Secondary hyperparathyroidism of renal origin: Secondary | ICD-10-CM | POA: Diagnosis not present

## 2017-05-06 DIAGNOSIS — E1129 Type 2 diabetes mellitus with other diabetic kidney complication: Secondary | ICD-10-CM | POA: Diagnosis not present

## 2017-05-06 DIAGNOSIS — N186 End stage renal disease: Secondary | ICD-10-CM | POA: Diagnosis not present

## 2017-05-08 DIAGNOSIS — D509 Iron deficiency anemia, unspecified: Secondary | ICD-10-CM | POA: Diagnosis not present

## 2017-05-08 DIAGNOSIS — N2581 Secondary hyperparathyroidism of renal origin: Secondary | ICD-10-CM | POA: Diagnosis not present

## 2017-05-08 DIAGNOSIS — Z23 Encounter for immunization: Secondary | ICD-10-CM | POA: Diagnosis not present

## 2017-05-08 DIAGNOSIS — N186 End stage renal disease: Secondary | ICD-10-CM | POA: Diagnosis not present

## 2017-05-08 DIAGNOSIS — D631 Anemia in chronic kidney disease: Secondary | ICD-10-CM | POA: Diagnosis not present

## 2017-05-08 DIAGNOSIS — E1129 Type 2 diabetes mellitus with other diabetic kidney complication: Secondary | ICD-10-CM | POA: Diagnosis not present

## 2017-05-09 ENCOUNTER — Encounter: Payer: Medicare Other | Admitting: Physician Assistant

## 2017-05-09 ENCOUNTER — Other Ambulatory Visit (INDEPENDENT_AMBULATORY_CARE_PROVIDER_SITE_OTHER): Payer: Medicare Other

## 2017-05-09 DIAGNOSIS — Z794 Long term (current) use of insulin: Secondary | ICD-10-CM | POA: Diagnosis not present

## 2017-05-09 DIAGNOSIS — I120 Hypertensive chronic kidney disease with stage 5 chronic kidney disease or end stage renal disease: Secondary | ICD-10-CM | POA: Diagnosis not present

## 2017-05-09 DIAGNOSIS — E1165 Type 2 diabetes mellitus with hyperglycemia: Secondary | ICD-10-CM

## 2017-05-09 DIAGNOSIS — E1122 Type 2 diabetes mellitus with diabetic chronic kidney disease: Secondary | ICD-10-CM | POA: Diagnosis not present

## 2017-05-09 DIAGNOSIS — Z992 Dependence on renal dialysis: Secondary | ICD-10-CM | POA: Diagnosis not present

## 2017-05-09 DIAGNOSIS — L98492 Non-pressure chronic ulcer of skin of other sites with fat layer exposed: Secondary | ICD-10-CM | POA: Diagnosis not present

## 2017-05-09 DIAGNOSIS — N186 End stage renal disease: Secondary | ICD-10-CM | POA: Diagnosis not present

## 2017-05-09 DIAGNOSIS — S31102D Unspecified open wound of abdominal wall, epigastric region without penetration into peritoneal cavity, subsequent encounter: Secondary | ICD-10-CM | POA: Diagnosis not present

## 2017-05-09 DIAGNOSIS — E11622 Type 2 diabetes mellitus with other skin ulcer: Secondary | ICD-10-CM | POA: Diagnosis not present

## 2017-05-09 LAB — COMPREHENSIVE METABOLIC PANEL
ALT: 16 U/L (ref 0–53)
AST: 26 U/L (ref 0–37)
Albumin: 3.8 g/dL (ref 3.5–5.2)
Alkaline Phosphatase: 96 U/L (ref 39–117)
BUN: 25 mg/dL — ABNORMAL HIGH (ref 6–23)
CO2: 34 mEq/L — ABNORMAL HIGH (ref 19–32)
Calcium: 9.2 mg/dL (ref 8.4–10.5)
Chloride: 93 mEq/L — ABNORMAL LOW (ref 96–112)
Creatinine, Ser: 5.58 mg/dL (ref 0.40–1.50)
GFR: 12.86 mL/min — CL (ref >60.00)
Glucose, Bld: 255 mg/dL — ABNORMAL HIGH (ref 70–99)
Potassium: 4 mEq/L (ref 3.5–5.1)
Sodium: 137 mEq/L (ref 135–145)
Total Bilirubin: 0.3 mg/dL (ref 0.2–1.2)
Total Protein: 6.6 g/dL (ref 6.0–8.3)

## 2017-05-09 LAB — HEMOGLOBIN A1C: Hgb A1c MFr Bld: 8.6 % — ABNORMAL HIGH (ref 4.6–6.5)

## 2017-05-10 DIAGNOSIS — D631 Anemia in chronic kidney disease: Secondary | ICD-10-CM | POA: Diagnosis not present

## 2017-05-10 DIAGNOSIS — D509 Iron deficiency anemia, unspecified: Secondary | ICD-10-CM | POA: Diagnosis not present

## 2017-05-10 DIAGNOSIS — Z23 Encounter for immunization: Secondary | ICD-10-CM | POA: Diagnosis not present

## 2017-05-10 DIAGNOSIS — N2581 Secondary hyperparathyroidism of renal origin: Secondary | ICD-10-CM | POA: Diagnosis not present

## 2017-05-10 DIAGNOSIS — N186 End stage renal disease: Secondary | ICD-10-CM | POA: Diagnosis not present

## 2017-05-10 DIAGNOSIS — E1129 Type 2 diabetes mellitus with other diabetic kidney complication: Secondary | ICD-10-CM | POA: Diagnosis not present

## 2017-05-10 NOTE — Progress Notes (Signed)
WILMAR, PRABHAKAR ([id number]) Visit Report for 05/09/2017 Foot Assessment Details Patient Name: TATEZaydn, [place]. Date of Service: 05/09/2017 8:45 AM Medical Record Number: [id number] Patient Account Number: [id number] Date of Birth/Sex: [birth year] (76 y.o. Male) Treating RN: Montey Hora Primary Care Yanelle Sousa: Tedra Senegal Other Clinician: Referring Lizbeth Feijoo: Tedra Senegal Treating Allanna Bresee/Extender: [name], HOYT Weeks in Treatment: 14 Foot Assessment Items Site Locations + = Sensation present, - = Sensation absent, C = Callus, U = Ulcer R = Redness, W = Warmth, M = Maceration, PU = Pre-ulcerative lesion F = Fissure, S = Swelling, D = Dryness Assessment Right: Left: Other Deformity: No No Prior Foot Ulcer: No No Prior Amputation: No No Charcot Joint: No No Ambulatory Status: Ambulatory Without Help Gait: Steady Electronic Signature(s) Signed: 05/09/2017 2:04:16 PM By: Montey Hora Entered By: Montey Hora on 05/09/2017 09:23:05

## 2017-05-10 NOTE — Progress Notes (Signed)
[name], [name] ([id number]) Visit Report for 05/09/2017 Arrival Information Details Patient Name: [name], [name]. Date of Service: 05/09/2017 8:45 AM Medical Record Number: [id number] Patient Account Number: [id number] Date of Birth/Sex: Jul 01, 1941 (76 y.o. Male) Treating RN: Montey Hora Primary Care Jovann Luse: Tedra Senegal Other Clinician: Referring Verdelle Valtierra: Tedra Senegal Treating Knox Cervi/Extender: [name], HOYT Weeks in Treatment: 14 Visit Information History Since Last Visit Added or deleted any medications: No Patient Arrived: Ambulatory Any new allergies or adverse reactions: No Arrival Time: 09:08 Had a fall or experienced change in No Accompanied By: self activities of daily living that may affect Transfer Assistance: None risk of falls: Patient Identification Verified: Yes Signs or symptoms of abuse/neglect since last No Secondary Verification Process Yes visito Completed: Hospitalized since last visit: No Patient Has Alerts: Yes Has Dressing in Place as Prescribed: Yes Patient Alerts: Patient on Blood Pain Present Now: No Thinner DMII aspirin 650mg  Electronic Signature(s) Signed: 05/09/2017 2:04:16 PM By: Montey Hora Entered By: Montey Hora on 05/09/2017 09:09:04 Prater, [name] ([id number]) -------------------------------------------------------------------------------- Clinic Level of Care Assessment Details Patient Name: [name]. Date of Service: 05/09/2017 8:45 AM Medical Record Number: [id number] Patient Account Number: [id number] Date of Birth/Sex: 05/05/1941 (76 y.o. Male) Treating RN: Cornell Barman Primary Care Chanceler Pullin: Tedra Senegal Other Clinician: Referring Mikeria Valin: Tedra Senegal Treating Dewan Emond/Extender: [name], HOYT Weeks in Treatment: 14 Clinic Level of Care Assessment Items TOOL 4 Quantity Score []  - Use when only an EandM is performed on FOLLOW-UP visit 0 ASSESSMENTS - Nursing Assessment / Reassessment []  - Reassessment  of Co-morbidities (includes updates in patient status) 0 X - Reassessment of Adherence to Treatment Plan 1 5 ASSESSMENTS - Wound and Skin Assessment / Reassessment X - Simple Wound Assessment / Reassessment - one wound 1 5 []  - Complex Wound Assessment / Reassessment - multiple wounds 0 []  - Dermatologic / Skin Assessment (not related to wound area) 0 ASSESSMENTS - Focused Assessment []  - Circumferential Edema Measurements - multi extremities 0 []  - Nutritional Assessment / Counseling / Intervention 0 []  - Lower Extremity Assessment (monofilament, tuning fork, pulses) 0 []  - Peripheral Arterial Disease Assessment (using hand held doppler) 0 ASSESSMENTS - Ostomy and/or Continence Assessment and Care []  - Incontinence Assessment and Management 0 []  - Ostomy Care Assessment and Management (repouching, etc.) 0 PROCESS - Coordination of Care X - Simple Patient / Family Education for ongoing care 1 15 []  - Complex (extensive) Patient / Family Education for ongoing care 0 []  - Staff obtains Programmer, systems, Records, Test Results / Process Orders 0 []  - Staff telephones HHA, Nursing Homes / Clarify orders / etc 0 []  - Routine Transfer to another Facility (non-emergent condition) 0 Millon, [name] ([id number]) []  - Routine Hospital Admission (non-emergent condition) 0 []  - New Admissions / Biomedical engineer / Ordering NPWT, Apligraf, etc. 0 []  - Emergency Hospital Admission (emergent condition) 0 X - Simple Discharge Coordination 1 10 []  - Complex (extensive) Discharge Coordination 0 PROCESS - Special Needs []  - Pediatric / Minor Patient Management 0 []  - Isolation Patient Management 0 []  - Hearing / Language / Visual special needs 0 []  - Assessment of Community assistance (transportation, D/C planning, etc.) 0 []  - Additional assistance / Altered mentation 0 []  - Support Surface(s) Assessment (bed, cushion, seat, etc.) 0 INTERVENTIONS - Wound Cleansing / Measurement X - Simple Wound  Cleansing - one wound 1 5 []  - Complex Wound Cleansing - multiple wounds 0 X - Wound Imaging (photographs - any number of wounds)  1 5 []  - Wound Tracing (instead of photographs) 0 X - Simple Wound Measurement - one wound 1 5 []  - Complex Wound Measurement - multiple wounds 0 INTERVENTIONS - Wound Dressings []  - Small Wound Dressing one or multiple wounds 0 X - Medium Wound Dressing one or multiple wounds 1 15 []  - Large Wound Dressing one or multiple wounds 0 []  - Application of Medications - topical 0 []  - Application of Medications - injection 0 INTERVENTIONS - Miscellaneous []  - External ear exam 0 Mcmillon, [name] ([id number]) []  - Specimen Collection (cultures, biopsies, blood, body fluids, etc.) 0 []  - Specimen(s) / Culture(s) sent or taken to Lab for analysis 0 []  - Patient Transfer (multiple staff / Harrel Lemon Lift / Similar devices) 0 []  - Simple Staple / Suture removal (25 or less) 0 []  - Complex Staple / Suture removal (26 or more) 0 []  - Hypo / Hyperglycemic Management (close monitor of Blood Glucose) 0 []  - Ankle / Brachial Index (ABI) - do not check if billed separately 0 X - Vital Signs 1 5 Has the patient been seen at the hospital within the last three years: Yes Total Score: 70 Level Of Care: New/Established - Level 2 Electronic Signature(s) Signed: 05/09/2017 1:20:34 PM By: Gretta Cool, BSN, RN, CWS, Kim RN, BSN Entered By: Gretta Cool, BSN, RN, CWS, Kim on 05/09/2017 09:28:53 Foglesong, [name] ([id number]) -------------------------------------------------------------------------------- Encounter Discharge Information Details Patient Name: [name]. Date of Service: 05/09/2017 8:45 AM Medical Record Number: [id number] Patient Account Number: [id number] Date of Birth/Sex: 10/30/1940 (76 y.o. Male) Treating RN: Montey Hora Primary Care Etna Forquer: Tedra Senegal Other Clinician: Referring Kashmere Staffa: Tedra Senegal Treating Santiana Glidden/Extender: [name], HOYT Weeks in Treatment:  14 Encounter Discharge Information Items Discharge Pain Level: 0 Discharge Condition: Stable Ambulatory Status: Ambulatory Discharge Destination: Home Transportation: Private Auto Accompanied By: self Schedule Follow-up Appointment: Yes Medication Reconciliation completed and provided to Patient/Care No Kyrstal Monterrosa: Provided on Clinical Summary of Care: 05/09/2017 Form Type Recipient Paper Patient WT Electronic Signature(s) Signed: 05/09/2017 2:40:18 PM By: Alric Quan Entered By: Alric Quan on 05/09/2017 12:22:51 Seely, [name] ([id number]) -------------------------------------------------------------------------------- Lower Extremity Assessment Details Patient Name: [name]. Date of Service: 05/09/2017 8:45 AM Medical Record Number: [id number] Patient Account Number: [id number] Date of Birth/Sex: Feb 14, 1941 (76 y.o. Male) Treating RN: Montey Hora Primary Care Mckell Riecke: Tedra Senegal Other Clinician: Referring Tremaine Earwood: Tedra Senegal Treating Ayshia Gramlich/Extender: [name], HOYT Weeks in Treatment: 14 Electronic Signature(s) Signed: 05/09/2017 2:04:16 PM By: Montey Hora Entered By: Montey Hora on 05/09/2017 09:19:59 Nace, [name] ([id number]) -------------------------------------------------------------------------------- Multi Wound Chart Details Patient Name: [name]. Date of Service: 05/09/2017 8:45 AM Medical Record Number: [id number] Patient Account Number: [id number] Date of Birth/Sex: 05/10/1941 (76 y.o. Male) Treating RN: Cornell Barman Primary Care Angelique Chevalier: Tedra Senegal Other Clinician: Referring Kara Melching: Tedra Senegal Treating Daiwik Buffalo/Extender: [name], HOYT Weeks in Treatment: 14 Vital Signs Height(in): 72 Pulse(bpm): 63 Weight(lbs): 201 Blood Pressure 139/83 (mmHg): Body Mass Index(BMI): 27 Temperature(F): 97.7 Respiratory Rate 16 (breaths/min): Photos: [3:No Photos] [N/A:N/A] Wound Location: [3:Abdomen - midline -  Medial] [N/A:N/A] Wounding Event: [3:Gradually Appeared] [N/A:N/A] Primary Etiology: [3:Pressure Ulcer] [N/A:N/A] Comorbid History: [3:Anemia, Hypertension, Peripheral Venous Disease, Type II Diabetes, Gout, Neuropathy] [N/A:N/A] Date Acquired: [3:04/14/2017] [N/A:N/A] Weeks of Treatment: [3:3] [N/A:N/A] Wound Status: [3:Open] [N/A:N/A] Measurements L x W x D 0.5x0.4x0.1 [N/A:N/A] (cm) Area (cm) : [3:0.157] [N/A:N/A] Volume (cm) : [3:0.016] [N/A:N/A] % Reduction in Area: [3:89.00%] [N/A:N/A] % Reduction in Volume: 88.80% [N/A:N/A] Classification: [3:Category/Stage II] [N/A:N/A] Exudate Amount: [  3:Large] [N/A:N/A] Exudate Type: [3:Sanguinous] [N/A:N/A] Exudate Color: [3:red] [N/A:N/A] Wound Margin: [3:Flat and Intact] [N/A:N/A] Granulation Amount: [3:Large (67-100%)] [N/A:N/A] Granulation Quality: [3:Red] [N/A:N/A] Necrotic Amount: [3:None Present (0%)] [N/A:N/A] Exposed Structures: [3:Fascia: No Fat Layer (Subcutaneous Tissue) Exposed: No Tendon: No] [N/A:N/A] Muscle: No Joint: No Bone: No Epithelialization: Small (1-33%) N/A N/A Periwound Skin Texture: Excoriation: No N/A N/A Induration: No Callus: No Crepitus: No Rash: No Scarring: No Periwound Skin Maceration: No N/A N/A Moisture: Dry/Scaly: No Periwound Skin Color: Atrophie Blanche: No N/A N/A Cyanosis: No Ecchymosis: No Erythema: No Hemosiderin Staining: No Mottled: No Pallor: No Rubor: No Temperature: No Abnormality N/A N/A Tenderness on No N/A N/A Palpation: Wound Preparation: Ulcer Cleansing: N/A N/A Rinsed/Irrigated with Saline Topical Anesthetic Applied: Other: lidocaine 4% Treatment Notes Electronic Signature(s) Signed: 05/09/2017 1:20:34 PM By: Gretta Cool, BSN, RN, CWS, Kim RN, BSN Entered By: Gretta Cool, BSN, RN, CWS, Kim on 05/09/2017 09:26:43 Villarruel, [name] ([id number]) -------------------------------------------------------------------------------- [place] Details Patient  Name: [name]. Date of Service: 05/09/2017 8:45 AM Medical Record Number: [id number] Patient Account Number: [id number] Date of Birth/Sex: [birth year] (76 y.o. Male) Treating RN: Cornell Barman Primary Care Sparrow Sanzo: Tedra Senegal Other Clinician: Referring Arzu Mcgaughey: Tedra Senegal Treating Froilan Mclean/Extender: [name], HOYT Weeks in Treatment: 14 Active Inactive ` Orientation to the Wound Care Program Nursing Diagnoses: Knowledge deficit related to the wound healing center program Goals: Patient/caregiver will verbalize understanding of the [place] Program Date Initiated: 01/28/2017 Target Resolution Date: 07/19/2017 Goal Status: Active Interventions: Provide education on orientation to the wound center Notes: Electronic Signature(s) Signed: 05/09/2017 1:20:34 PM By: Gretta Cool, BSN, RN, CWS, Kim RN, BSN Entered By: Gretta Cool, BSN, RN, CWS, Kim on 05/09/2017 09:26:30 [place], [name] ([id number]) -------------------------------------------------------------------------------- Pain Assessment Details Patient Name: [name]. Date of Service: 05/09/2017 8:45 AM Medical Record Number: [id number] Patient Account Number: [id number] Date of Birth/Sex: Jul 29, 1941 (76 y.o. Male) Treating RN: Montey Hora Primary Care Carlton Buskey: Tedra Senegal Other Clinician: Referring Makoto Sellitto: Tedra Senegal Treating Zylen Wenig/Extender: [name], HOYT Weeks in Treatment: 14 Active Problems Location of Pain Severity and Description of Pain Patient Has Paino No Site Locations Pain Management and Medication Current Pain Management: Notes Topical or injectable lidocaine is offered to patient for acute pain when surgical debridement is performed. If needed, Patient is instructed to use over the counter pain medication for the following 24-48 hours after debridement. Wound care MDs do not prescribed pain medications. Patient has chronic pain or uncontrolled pain. Patient has been instructed to make  an appointment with their Primary Care Physician for pain management. Electronic Signature(s) Signed: 05/09/2017 2:04:16 PM By: Montey Hora Entered By: Montey Hora on 05/09/2017 09:13:23 Liwanag, [name] ([id number]) -------------------------------------------------------------------------------- Patient/Caregiver Education Details Patient Name: [name]. Date of Service: 05/09/2017 8:45 AM Medical Record Number: [id number] Patient Account Number: [id number] Date of Birth/Gender: September 21, 1941 (76 y.o. Male) Treating RN: Ahmed Prima Primary Care Physician: Tedra Senegal Other Clinician: Referring Physician: Tedra Senegal Treating Physician/Extender: Sharalyn Ink in Treatment: 14 Education Assessment Education Provided To: Patient Education Topics Provided Wound/Skin Impairment: Handouts: Other: change dressing as ordered Methods: Demonstration, Explain/Verbal Responses: State content correctly Electronic Signature(s) Signed: 05/09/2017 2:40:18 PM By: Alric Quan Entered By: Alric Quan on 05/09/2017 12:23:07 Bales, [name] ([id number]) -------------------------------------------------------------------------------- Wound Assessment Details Patient Name: [name]. Date of Service: 05/09/2017 8:45 AM Medical Record Number: [id number] Patient Account Number: [id number] Date of Birth/Sex: 10/22/1940 (76 y.o. Male) Treating RN: Montey Hora Primary Care Hoa Deriso: Tedra Senegal Other Clinician: Referring  Cleora Karnik: Tedra Senegal Treating Loriel Diehl/Extender: [name], HOYT Weeks in Treatment: 14 Wound Status Wound Number: 3 Primary Pressure Ulcer Etiology: Wound Location: Abdomen - midline - Medial Wound Open Wounding Event: Gradually Appeared Status: Date Acquired: 04/14/2017 Comorbid Anemia, Hypertension, Peripheral Weeks Of Treatment: 3 History: Venous Disease, Type II Diabetes, Clustered Wound: No Gout, Neuropathy Photos Photo Uploaded  By: Montey Hora on 05/09/2017 14:03:11 Wound Measurements Length: (cm) 0.5 Width: (cm) 0.4 Depth: (cm) 0.1 Area: (cm) 0.157 Volume: (cm) 0.016 % Reduction in Area: 89% % Reduction in Volume: 88.8% Epithelialization: Small (1-33%) Tunneling: No Undermining: No Wound Description Classification: Category/Stage II Foul Odor After Wound Margin: Flat and Intact Slough/Fibrino Exudate Amount: Large Exudate Type: Sanguinous Exudate Color: red Cleansing: No No Wound Bed Granulation Amount: Large (67-100%) Exposed Structure Granulation Quality: Red Fascia Exposed: No Necrotic Amount: None Present (0%) Fat Layer (Subcutaneous Tissue) Exposed: No Tendon Exposed: No Daigle, [name] ([id number]) Muscle Exposed: No Joint Exposed: No Bone Exposed: No Periwound Skin Texture Texture Color No Abnormalities Noted: No No Abnormalities Noted: No Callus: No Atrophie Blanche: No Crepitus: No Cyanosis: No Excoriation: No Ecchymosis: No Induration: No Erythema: No Rash: No Hemosiderin Staining: No Scarring: No Mottled: No Pallor: No Moisture Rubor: No No Abnormalities Noted: No Dry / Scaly: No Temperature / Pain Maceration: No Temperature: No Abnormality Wound Preparation Ulcer Cleansing: Rinsed/Irrigated with Saline Topical Anesthetic Applied: Other: lidocaine 4%, Treatment Notes Wound #3 (Medial Abdomen - midline) 1. Cleansed with: Clean wound with Normal Saline 2. Anesthetic Topical Lidocaine 4% cream to wound bed prior to debridement 3. Peri-wound Care: Skin Prep 4. Dressing Applied: Hydrafera Blue 5. Secondary [place] Signature(s) Signed: 05/09/2017 2:04:16 PM By: Montey Hora Entered By: Montey Hora on 05/09/2017 09:19:48 Redd, [name] ([id number]) -------------------------------------------------------------------------------- [place] Details Patient Name: [name]. Date of Service: 05/09/2017 8:45  AM Medical Record Number: [id number] Patient Account Number: [id number] Date of Birth/Sex: January 12, 1941 (76 y.o. Male) Treating RN: Montey Hora Primary Care Abishai Viegas: Tedra Senegal Other Clinician: Referring [place] Segler: Tedra Senegal Treating Emera Bussie/Extender: [name], HOYT Weeks in Treatment: 14 Vital Signs Time Taken: 09:13 Temperature (F): 97.7 Height (in): 72 Pulse (bpm): 63 Source: Measured Respiratory Rate (breaths/min): 16 Weight (lbs): 201 Blood Pressure (mmHg): 139/83 Source: Measured Reference Range: 80 - 120 mg / dl Body Mass Index (BMI): 27.3 Electronic Signature(s) Signed: 05/09/2017 2:04:16 PM By: Montey Hora Entered By: Montey Hora on 05/09/2017 09:15:13

## 2017-05-10 NOTE — Progress Notes (Signed)
BRASEN, BUNDREN ([id number]) Visit Report for 05/09/2017 Chief Complaint Document Details Patient Name: [name]. Date of Service: 05/09/2017 8:45 AM Medical Record Number: [id number] Patient Account Number: [id number] Date of Birth/Sex: [birth year] (76 y.o. Male) Treating RN: Montey Hora Primary Care Provider: Tedra Senegal Other Clinician: Referring Provider: Tedra Senegal Treating Provider/Extender: [name],  Weeks in Treatment: 14 Information Obtained from: Patient Chief Complaint Patients presents for treatment of an open diabetic ulcer to the periumbilical area. he had a similar problem in January of this year and the ulcer healed with offloading and local care Electronic Signature(s) Signed: 05/09/2017 12:41:32 PM By: Worthy Keeler PA-C Entered By: Worthy Keeler on 05/09/2017 09:25:08 [name] ([id number]) -------------------------------------------------------------------------------- HPI Details Patient Name: [name]. Date of Service: 05/09/2017 8:45 AM Medical Record Number: [id number] Patient Account Number: [id number] Date of Birth/Sex: 26-Dec-1940 (76 y.o. Male) Treating RN: Montey Hora Primary Care Provider: Tedra Senegal Other Clinician: Referring Provider: Tedra Senegal Treating Provider/Extender: [name],  Weeks in Treatment: 14 History of Present Illness Location: periumbilical area. is on the left of the midline Quality: Patient reports No Pain. Severity: Patient states wound (s) are getting better. Duration: Patient has had the wound for < 2 weeks prior to presenting for treatment Context: The wound appeared gradually over time Modifying Factors: Patient is currently on renal dialysis and receives treatments 3 times weekly Associated Signs and Symptoms: Patient reports having:then local care with hydrogen peroxide and Bactroban ointment HPI Description: 76 year old gentleman seen earlier this year in January for a abdominal  wound is now back with the same problem which has recurred for about 2 weeks and he has been trying to apply some local antibiotic ointment and a Band-Aid there. Of note he has significant pressure in this area due to his beltline and may have put on some weight which results in his trousers being very tight around the waist. The else has changed in his HandP and his diabetes is pretty well controlled. most recent hemoglobin A1c on 12/06/2016 was 7.8%. he was reviewed by his endocrinologist Dr. Dwyane Dee who made appropriate modifications to his treatment plan. he was recently seen by neurology for mild cognitive impairment that was found to be normal for his age 09/13/17 patient appears to be doing well in regard to his abdominal wound area on evaluation today. He has been tolerating the dressing changes with the [facility] Dressing I'm pleased with how this has progressed and in fact the wound appears to be completely healed on evaluation today. There is definitely no evidence of infection. 03/29/17 Patient's wound on the abdominal region appears to be completely healed on evaluation today. This wound has been doing very well and I think the biggest thing is going to be keeping it from reactivation where it rubbed on his pants. 04/18/2017 -- he was recently discharged about 3 weeks ago and was using a binder which probably caused him abrasion on the area of the recently healed scar. 05/02/17 on evaluation today patient appears to be doing fairly well in regard to his abdominal wound at the site of the surgical scar. Unfortunately the scar tissue is very fragile and tends to reopen. He did get a small abdominal binder to try to pad the area in order to offload and prevent his pants from rubbing specifically at the site. With that being said he tells me that Dr. Con Memos was concerned that the binder might actually have rub the area causing a reopening. I did  have a look at this today and unfortunately  it appears that he was putting this over his pants which was making matters worse. 05/09/17 on evaluation today patient continues to do well with the [facility] Dressing. There is a slight skin tear inferior to the wound that we have been treating that may have been due to the dressing sticking slightly. This is minimal however. I did discuss with patient prior to removal of the dressing for dressing [name]. ([id number]) changes that they whet this in order to loosen it and not risk damaging in the field. Nonetheless it appears that they have been doing very well with the dressing changes he had his wife took over the dressing changes as she states that he was doing it wrong. Overall though I'm pleased with how things are progressing. No fevers, chills, nausea, or vomiting noted at this time. ====== Old notes The 76 year old gentleman who has a past medical history of diabetes mellitus, end-stage renal disease on dialysis, hypertension, secondary hyperparathyroidism, peripheral vascular disease and history of previous arterial bifemoral bypass graft in 1992 also has a history of gout, hyperlipidemia, hypothyroidism. last hemoglobin A1c was 7.5%. status post AV fistula placement, breast surgery for left granulomatous mastitis, triple a repair in 1992, small bowel obstruction, upper arm angiograms for AV fistula on the left side. was noted to have a superficial skin wound in the periumbilical area since late December 2017. He was asked to clean it with hydrogen peroxide and apply Bactroban. He was seen by vascular surgery in October 2017 where his right ABI was 0.56 left ABI was 0.87 and right TBI was 0.69 and left TBI was 0.70 ===== Electronic Signature(s) Signed: 05/09/2017 12:41:32 PM By: Worthy Keeler PA-C Entered By: Worthy Keeler on 05/09/2017 09:32:45 [name] ([id number]) -------------------------------------------------------------------------------- Physical  Exam Details Patient Name: [name]. Date of Service: 05/09/2017 8:45 AM Medical Record Number: [id number] Patient Account Number: [id number] Date of Birth/Sex: 10-Aug-1941 (76 y.o. Male) Treating RN: Montey Hora Primary Care Provider: Tedra Senegal Other Clinician: Referring Provider: Tedra Senegal Treating Provider/Extender: [name],  Weeks in Treatment: 64 Constitutional Well-nourished and well-hydrated in no acute distress. Respiratory normal breathing without difficulty. clear to auscultation bilaterally. Cardiovascular regular rate and rhythm with normal S1, S2. Psychiatric this patient is able to make decisions and demonstrates good insight into disease process. Alert and Oriented x 3. pleasant and cooperative. Notes Patient's wound bed shows an excellent granular surface but no surrounding erythema and no purulent discharge. There was no slough noted. No debridement required Electronic Signature(s) Signed: 05/09/2017 12:41:32 PM By: Worthy Keeler PA-C Entered By: Worthy Keeler on 05/09/2017 09:33:18 Funderburke, Talmage [name] ([id number]) -------------------------------------------------------------------------------- Physician Orders Details Patient Name: [name]. Date of Service: 05/09/2017 8:45 AM Medical Record Number: [id number] Patient Account Number: [id number] Date of Birth/Sex: Apr 15, 1941 (76 y.o. Male) Treating RN: Cornell Barman Primary Care Provider: Tedra Senegal Other Clinician: Referring Provider: Tedra Senegal Treating Provider/Extender: [name],  Weeks in Treatment: 9 Verbal / Phone Orders: No Diagnosis Coding ICD-10 Coding Code Description E11.622 Type 2 diabetes mellitus with other skin ulcer Unspecified open wound of abdominal wall, epigastric region without penetration into S31.102D peritoneal cavity, subsequent encounter Z99.2 Dependence on renal dialysis Wound Cleansing Wound #3 Medial Abdomen - midline o Cleanse wound with  mild soap and water Anesthetic Wound #3 Medial Abdomen - midline o Topical Lidocaine 4% cream applied to wound bed prior to debridement Primary Wound Dressing Wound #3 Medial  Abdomen - midline o Hydrafera Blue - wet with saline to remove. Secondary Dressing Wound #3 Medial Abdomen - midline o Other - [place] Dressing Change Frequency Wound #3 Medial Abdomen - midline o Change dressing every day. Follow-up Appointments Wound #3 Medial Abdomen - midline o Return Appointment in 2 weeks. Electronic Signature(s) Signed: 05/09/2017 12:41:32 PM By: Worthy Keeler PA-C Signed: 05/09/2017 1:20:34 PM By: Gretta Cool BSN, RN, CWS, Kim RN, BSN Deleeuw, Talmage [name] ([id number]) Entered By: Gretta Cool, BSN, RN, CWS, Kim on 05/09/2017 51:76:16 Bau, Talmage [name] ([id number]) -------------------------------------------------------------------------------- Problem List Details Patient Name: LORNE, WINKELS. Date of Service: 05/09/2017 8:45 AM Medical Record Number: [id number] Patient Account Number: [id number] Date of Birth/Sex: 1940-10-28 (76 y.o. Male) Treating RN: Montey Hora Primary Care Provider: Tedra Senegal Other Clinician: Referring Provider: Tedra Senegal Treating Provider/Extender: Worthy Keeler Weeks in Treatment: 14 Active Problems ICD-10 Encounter Code Description Active Date Diagnosis E11.622 Type 2 diabetes mellitus with other skin ulcer 01/28/2017 Yes S31.102D Unspecified open wound of abdominal wall, epigastric 01/28/2017 Yes region without penetration into peritoneal cavity, subsequent encounter Z99.2 Dependence on renal dialysis 01/28/2017 Yes Inactive Problems Resolved Problems Electronic Signature(s) Signed: 05/09/2017 12:41:32 PM By: Worthy Keeler PA-C Entered By: Worthy Keeler on 05/09/2017 09:24:54 Hazelip, Talmage [name] ([id number]) -------------------------------------------------------------------------------- Progress Note Details Patient Name: [name]. Date  of Service: 05/09/2017 8:45 AM Medical Record Number: [id number] Patient Account Number: [id number] Date of Birth/Sex: 1941/01/30 (76 y.o. Male) Treating RN: Montey Hora Primary Care Provider: Tedra Senegal Other Clinician: Referring Provider: Tedra Senegal Treating Provider/Extender: [name],  Weeks in Treatment: 14 Subjective Chief Complaint Information obtained from Patient Patients presents for treatment of an open diabetic ulcer to the periumbilical area. he had a similar problem in January of this year and the ulcer healed with offloading and local care History of Present Illness (HPI) The following HPI elements were documented for the patient's wound: Location: periumbilical area. is on the left of the midline Quality: Patient reports No Pain. Severity: Patient states wound (s) are getting better. Duration: Patient has had the wound for < 2 weeks prior to presenting for treatment Context: The wound appeared gradually over time Modifying Factors: Patient is currently on renal dialysis and receives treatments 3 times weekly Associated Signs and Symptoms: Patient reports having:then local care with hydrogen peroxide and Bactroban ointment 76 year old gentleman seen earlier this year in January for a abdominal wound is now back with the same problem which has recurred for about 2 weeks and he has been trying to apply some local antibiotic ointment and a Band-Aid there. Of note he has significant pressure in this area due to his beltline and may have put on some weight which results in his trousers being very tight around the waist. The else has changed in his HandP and his diabetes is pretty well controlled. most recent hemoglobin A1c on 12/06/2016 was 7.8%. he was reviewed by his endocrinologist Dr. Dwyane Dee who made appropriate modifications to his treatment plan. he was recently seen by neurology for mild cognitive impairment that was found to be normal for his age 59/21/18  patient appears to be doing well in regard to his abdominal wound area on evaluation today. He has been tolerating the dressing changes with the [place] Dressing I'm pleased with how this has progressed and in fact the wound appears to be completely healed on evaluation today. There is definitely no evidence of infection. 03/29/17 Patient's wound on the abdominal region appears to be completely  healed on evaluation today. This wound has been doing very well and I think the biggest thing is going to be keeping it from reactivation where it rubbed on his pants. 04/18/2017 -- he was recently discharged about 3 weeks ago and was using a binder which probably caused him abrasion on the area of the recently healed scar. 05/02/17 on evaluation today patient appears to be doing fairly well in regard to his abdominal wound at the [place]. ([id number]) site of the surgical scar. Unfortunately the scar tissue is very fragile and tends to reopen. He did get a small abdominal binder to try to pad the area in order to offload and prevent his pants from rubbing specifically at the site. With that being said he tells me that Dr. Con Memos was concerned that the binder might actually have rub the area causing a reopening. I did have a look at this today and unfortunately it appears that he was putting this over his pants which was making matters worse. 05/09/17 on evaluation today patient continues to do well with the [facility] Dressing. There is a slight skin tear inferior to the wound that we have been treating that may have been due to the dressing sticking slightly. This is minimal however. I did discuss with patient prior to removal of the dressing for dressing changes that they whet this in order to loosen it and not risk damaging in the field. Nonetheless it appears that they have been doing very well with the dressing changes he had his wife took over the dressing changes as she states that he  was doing it wrong. Overall though I'm pleased with how things are progressing. No fevers, chills, nausea, or vomiting noted at this time. ====== Old notes The 76 year old gentleman who has a past medical history of diabetes mellitus, end-stage renal disease on dialysis, hypertension, secondary hyperparathyroidism, peripheral vascular disease and history of previous arterial bifemoral bypass graft in 1992 also has a history of gout, hyperlipidemia, hypothyroidism. last hemoglobin A1c was 7.5%. status post AV fistula placement, breast surgery for left granulomatous mastitis, triple a repair in 1992, small bowel obstruction, upper arm angiograms for AV fistula on the left side. was noted to have a superficial skin wound in the periumbilical area since late December 2017. He was asked to clean it with hydrogen peroxide and apply Bactroban. He was seen by vascular surgery in October 2017 where his right ABI was 0.56 left ABI was 0.87 and right TBI was 0.69 and left TBI was 0.70 ===== Objective Constitutional Well-nourished and well-hydrated in no acute distress. Vitals Time Taken: 9:13 AM, Height: 72 in, Source: Measured, Weight: 201 lbs, Source: Measured, BMI: 27.3, Temperature: 97.7 F, Pulse: 63 bpm, Respiratory Rate: 16 breaths/min, Blood Pressure: 139/83 mmHg. Respiratory normal breathing without difficulty. clear to auscultation bilaterally. Cardiovascular regular rate and rhythm with normal S1, S2. Curfman, EUAL LINDSTROM ([id number]) Psychiatric this patient is able to make decisions and demonstrates good insight into disease process. Alert and Oriented x 3. pleasant and cooperative. General Notes: Patient's wound bed shows an excellent granular surface but no surrounding erythema and no purulent discharge. There was no slough noted. No debridement required Integumentary (Hair, Skin) Wound #3 status is Open. Original cause of wound was Gradually Appeared. The wound is located on  the Medial Abdomen - midline. The wound measures 0.5cm length x 0.4cm width x 0.1cm depth; 0.157cm^2 area and 0.016cm^3 volume. There is no tunneling or undermining noted. There is a large amount  of sanguinous drainage noted. The wound margin is flat and intact. There is large (67-100%) red granulation within the wound bed. There is no necrotic tissue within the wound bed. The periwound skin appearance did not exhibit: Callus, Crepitus, Excoriation, Induration, Rash, Scarring, Dry/Scaly, Maceration, Atrophie Blanche, Cyanosis, Ecchymosis, Hemosiderin Staining, Mottled, Pallor, Rubor, Erythema. Periwound temperature was noted as No Abnormality. Assessment Active Problems ICD-10 E11.622 - Type 2 diabetes mellitus with other skin ulcer S31.102D - Unspecified open wound of abdominal wall, epigastric region without penetration into peritoneal cavity, subsequent encounter Z99.2 - Dependence on renal dialysis Plan Wound Cleansing: Wound #3 Medial Abdomen - midline: Cleanse wound with mild soap and water Anesthetic: Wound #3 Medial Abdomen - midline: Topical Lidocaine 4% cream applied to wound bed prior to debridement Primary Wound Dressing: Wound #3 Medial Abdomen - midline: Hydrafera Blue - wet with saline to remove. Secondary Dressing: Wound #3 Medial Abdomen - midline: [place], KAIPO ARDIS ([id number]) Other - [place] Dressing Change Frequency: Wound #3 Medial Abdomen - midline: Change dressing every day. Follow-up Appointments: Wound #3 Medial Abdomen - midline: Return Appointment in 2 weeks. As previously discussed patient is no longer attempting to use the abdominal binder as this seems to have been causing more trouble than good. I think I would recommend that he continue to not use this. He is being more conscious of the pants he is wearing to make sure they are not too tight is also unbuttoning them when he goes to sit down in order to avoid any pressure to the abdominal  region also reducing any chance of friction. We will see him for reevaluation in one week to see were things stand. If anything worsens in the interim patient will contact the office for additional recommendations. Electronic Signature(s) Signed: 05/09/2017 12:41:32 PM By: Worthy Keeler PA-C Entered By: Worthy Keeler on 05/09/2017 09:34:42 [place], Talmage [name] ([id number]) -------------------------------------------------------------------------------- SuperBill Details Patient Name: [name]. Date of Service: 05/09/2017 Medical Record Number: [id number] Patient Account Number: [id number] Date of Birth/Sex: May 16, 1941 (76 y.o. Male) Treating RN: Montey Hora Primary Care Provider: Tedra Senegal Other Clinician: Referring Provider: Tedra Senegal Treating Provider/Extender: [name],  Weeks in Treatment: 14 Diagnosis Coding ICD-10 Codes Code Description E11.622 Type 2 diabetes mellitus with other skin ulcer Unspecified open wound of abdominal wall, epigastric region without penetration into S31.102D peritoneal cavity, subsequent encounter Z99.2 Dependence on renal dialysis Facility Procedures CPT4 Code: [id number] Description: [phone] - WOUND CARE VISIT-LEV 2 EST PT Modifier: Quantity: 1 Physician Procedures CPT4: Description Modifier Quantity Code [id number] - WC PHYS LEVEL 3 - EST PT 1 ICD-10 Description Diagnosis E11.622 Type 2 diabetes mellitus with other skin ulcer S31.102D Unspecified open wound of abdominal wall, epigastric region without  penetration into peritoneal cavity, subsequent encounter Z99.2 Dependence on renal dialysis Electronic Signature(s) Signed: 05/09/2017 12:41:32 PM By: Worthy Keeler PA-C Entered By: Worthy Keeler on 05/09/2017 09:35:10

## 2017-05-13 DIAGNOSIS — D631 Anemia in chronic kidney disease: Secondary | ICD-10-CM | POA: Diagnosis not present

## 2017-05-13 DIAGNOSIS — E1129 Type 2 diabetes mellitus with other diabetic kidney complication: Secondary | ICD-10-CM | POA: Diagnosis not present

## 2017-05-13 DIAGNOSIS — N186 End stage renal disease: Secondary | ICD-10-CM | POA: Diagnosis not present

## 2017-05-13 DIAGNOSIS — Z23 Encounter for immunization: Secondary | ICD-10-CM | POA: Diagnosis not present

## 2017-05-13 DIAGNOSIS — D509 Iron deficiency anemia, unspecified: Secondary | ICD-10-CM | POA: Diagnosis not present

## 2017-05-13 DIAGNOSIS — N2581 Secondary hyperparathyroidism of renal origin: Secondary | ICD-10-CM | POA: Diagnosis not present

## 2017-05-14 ENCOUNTER — Ambulatory Visit (INDEPENDENT_AMBULATORY_CARE_PROVIDER_SITE_OTHER): Payer: Medicare Other | Admitting: Endocrinology

## 2017-05-14 ENCOUNTER — Encounter: Payer: Self-pay | Admitting: Endocrinology

## 2017-05-14 VITALS — BP 134/68 | HR 67 | Ht 71.0 in | Wt 198.0 lb

## 2017-05-14 DIAGNOSIS — Z794 Long term (current) use of insulin: Secondary | ICD-10-CM

## 2017-05-14 DIAGNOSIS — E1165 Type 2 diabetes mellitus with hyperglycemia: Secondary | ICD-10-CM

## 2017-05-14 NOTE — Patient Instructions (Signed)
Check blood sugars on waking up  3/7 days  Also check blood sugars about 2 hours after a meal and do this after different meals by rotation  Recommended blood sugar levels on waking up is 90-130 and about 2 hours after meal is 130-160  Please bring your blood sugar monitor to each visit, thank you  Take 5 Novolog before dialysis

## 2017-05-14 NOTE — Progress Notes (Signed)
Patient ID: [name], male   DOB: November 19, 1940, 76 y.o.   MRN: [id number]           Reason for Appointment:  Follow-up for Type 2 Diabetes  Referring physician: [place]  History of Present Illness:          Date of diagnosis of type 2 diabetes mellitus:  1996      Background history:  He has had long-standing diabetes probably treated with metformin initially and subsequently with sulfonylurea drugs At some point he was also given Actos in addition to his glipizide which he is still taking Records of his control are only available for the last 4 years Over the last few years his A1c has been generally in the 6-7% range His A1c was 6.2 in May 2016 and apparently was starting to get low normal blood sugars at times He was admitted to the hospital for fluid overload and renal failure and at that time his Actos was stopped Subsequently his blood sugars had been markedly increased He was switched from regular insulin to NovoLog on his initial consultation  Recent history:    His A1c is higher than usual at 8.6 even though he was told at the dialysis center that his A1c was 7.4  INSULIN regimen is described as: Lantus 10 units in am; 10--5--10  units Novolog with meals with syringe  Dialysis is done at 12 noon, doing it on Monday/Wednesday/Fridays     Current blood sugar patterns and problems identified:  He did not bring his monitor for download  He also has been taking arbitrary doses of insulin that are different compared to his last visit He was supposed to be taking 13 units of Lantus in the morning but is taking 10.  Also he is taking are dose of NovoLog at breakfast and suppertime on his own, previously taking 8 units  Even with higher mealtime insulin doses not clear why his A1c is high  He thinks his blood sugars are not high after supper even though they were mostly over 200 on his last visit  He also has a large snack at diagnosis and previously was taking 5 units  right before going for dialysis but he is forgetting to do this at times  He does not think he has any hypoglycemia  He has difficulty remembering his blood sugar readings anything stare mostly around 140 except somewhat lower in the morning  Oral hypoglycemic drugs the patient is taking are:  on Actos 30 mg daily in a.m.     Side effects from medications have been: None  Compliance with the medical regimen: Good   Glucose monitoring:  done 0-2  times a day         Glucometer:  Freestyle  Blood Glucose readings by recall:  Mean values apply above for all meters except median for One Touch  PRE-MEAL Fasting Lunch Dinner Bedtime Overall  Glucose range: 120-140  147 140s   Mean/median:           Self-care: The diet that the patient has been following is: tries to limit sweets, portions. eating out only about once a week at a steak house        Typical meal intake: Breakfast is  Protein shake or bagel; supper 8 pm   Lunch is irregular on nondialysis days but has a Larger lunch on Sundays             Dietician visit, most recent: ?  Exercise: some walking At times  Weight history:  Wt Readings from Last 3 Encounters:  05/14/17 198 lb (89.8 kg)  03/13/17 203 lb 6.4 oz (92.3 kg)  01/29/17 196 lb (88.9 kg)    Glycemic control:    Lab Results  Component Value Date   HGBA1C 8.6 (H) 05/09/2017   HGBA1C 7.4 (H) 03/08/2017   HGBA1C 7.8 (H) 12/06/2016   Lab Results  Component Value Date   MICROALBUR 37.2 09/03/2016   LDLCALC 31 09/03/2016   CREATININE 5.58 (HH) 05/09/2017    Lab Results  Component Value Date   FRUCTOSAMINE 355 (H) 10/08/2016   FRUCTOSAMINE 407 (H) 08/29/2016   FRUCTOSAMINE 357 (H) 03/01/2016      Allergies as of 05/14/2017      Reactions   Penicillins Rash   Has patient had a PCN reaction causing immediate rash, facial/tongue/throat swelling, SOB or lightheadedness with hypotension: Yes Has patient had a PCN reaction causing severe  rash involving mucus membranes or skin necrosis: Yes Has patient had a PCN reaction that required hospitalization: No Has patient had a PCN reaction occurring within the last 10 years: No If all of the above answers are "NO", then may proceed with Cephalosporin use.      Medication List       Accurate as of 05/14/17 10:07 AM. Always use your most recent med list.          acetaminophen 500 MG tablet Commonly known as:  TYLENOL Take 500 mg by mouth every 6 (six) hours as needed (for pain).   acetaminophen-codeine 300-30 MG tablet Commonly known as:  TYLENOL #3 take 1 tablet by mouth every 4 hours if needed for pain   allopurinol 100 MG tablet Commonly known as:  ZYLOPRIM Take 100 mg by mouth daily.   aspirin 325 MG tablet Take 650 mg by mouth daily.   azithromycin 250 MG tablet Commonly known as:  ZITHROMAX Z-PAK One po daily x 5 days   clobetasol 0.05 % external solution Commonly known as:  TEMOVATE apply to affected area ON THE SCALP UP TO 2 TIMES A DAY AS NEEDED...  (REFER TO PRESCRIPTION NOTES).   donepezil 10 MG tablet Commonly known as:  ARICEPT Take 1 tablet (10 mg total) by mouth at bedtime.   Fluocinolone Acetonide Scalp 0.01 % Oil apply to affected area ON SCALP EVERY NIGHT AT BEDTIME, WASH UPON AWAKENING   fluticasone 0.05 % cream Commonly known as:  CUTIVATE apply to affected area ON FACE UP TO 2 TIMES A DAY AS NEEDED   fluticasone 50 MCG/ACT nasal spray Commonly known as:  FLONASE Place 1 spray into both nostrils daily.   FREESTYLE FREEDOM LITE w/Device Kit Use to check blood sugar 2 times per day dx code E11.65   freestyle lancets Use as instructed to check blood sugar 2 times per day dx code E11.65   glucose blood test strip Commonly known as:  ONETOUCH VERIO Use to check blood sugar 2 times per day. Dx code E11.65   insulin aspart 100 UNIT/ML injection Commonly known as:  novoLOG Inject 10 Units into the skin 3 (three) times daily before  meals.   insulin glargine 100 UNIT/ML injection Commonly known as:  LANTUS Inject 0.08 mLs (8 Units total) into the skin every morning.   Insulin Pen Needle 30G X 8 MM Misc Commonly known as:  NOVOFINE Use to inject insulin 3 times daily   ketoconazole 2 % shampoo Commonly known as:  NIZORAL Apply 1  application topically 2 (two) times a week.   levothyroxine 50 MCG tablet Commonly known as:  SYNTHROID, LEVOTHROID Take 1 tablet (50 mcg total) by mouth daily.   lidocaine-prilocaine cream Commonly known as:  EMLA Apply 1 application topically once. AS DIRECTED   loratadine 10 MG tablet Commonly known as:  CLARITIN Take 10 mg by mouth daily as needed for allergies.   metroNIDAZOLE 0.75 % gel Commonly known as:  METROGEL APPLY TO AFFECTED AREA ON SKIN  ON FACE TWICE DAILY   mupirocin ointment 2 % Commonly known as:  BACTROBAN Use as directed to affected areas   Naftifine HCl 2 % Crea Commonly known as:  NAFTIN Apply to feet daily for fungal infection   pioglitazone 30 MG tablet Commonly known as:  ACTOS take 1 tablet by mouth once daily   RENVELA 800 MG tablet Generic drug:  sevelamer carbonate Take 800-2,400 mg by mouth See admin instructions. 1,600-2,400 mg with breakfast then 1,600 mg with lunch then 2,400 mg with dinner (evening meal) and 800 mg with each snack   SENSIPAR 30 MG tablet Generic drug:  cinacalcet Take 30 mg by mouth daily.   simvastatin 20 MG tablet Commonly known as:  ZOCOR Take 20 mg by mouth at bedtime.   triamcinolone ointment 0.1 % Commonly known as:  KENALOG Apply 1 application topically 2 (two) times daily. Use between thighs       Allergies:  Allergies  Allergen Reactions  . Penicillins Rash    Has patient had a PCN reaction causing immediate rash, facial/tongue/throat swelling, SOB or lightheadedness with hypotension: Yes Has patient had a PCN reaction causing severe rash involving mucus membranes or skin necrosis: Yes Has  patient had a PCN reaction that required hospitalization: No Has patient had a PCN reaction occurring within the last 10 years: No If all of the above answers are "NO", then may proceed with Cephalosporin use.     Past Medical History:  Diagnosis Date  . Allergy   . Anemia   . Arthritis   . Coronary artery disease   . Diabetes mellitus    Type 2  . Diverticulitis   . ED (erectile dysfunction)   . 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 ESRD (end stage renal disease) on dialysis ([place]) 03/2015  . GERD (gastroesophageal reflux disease)    pepto   . Gout   . Hyperlipidemia   . Hypertension   . Hypothyroidism   . Pneumonia   . PVD (peripheral vascular disease) ([place])    has plastic aorta  . Renal insufficiency   . Seasonal allergies   . Shortness of breath dyspnea   . Sleep apnea   . Thyroid disease     Past Surgical History:  Procedure Laterality Date  . aortobifemoral bypass    . AV FISTULA PLACEMENT Left 12/01/2013   Procedure: ARTERIOVENOUS (AV) FISTULA CREATION- LEFT BRACHIOCEPHALIC;  Surgeon: Angelia Mould, MD;  Location: [facility];  Service: Vascular;  Laterality: Left;  . [place] TRANSPOSITION Right 07/27/2014   Procedure: BASCILIC VEIN TRANSPOSITION;  Surgeon: Angelia Mould, MD;  Location: [facility];  Service: Vascular;  Laterality: Right;  . BREAST SURGERY     left - granulomatous mastitis  . COLONOSCOPY    . 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 EYE SURGERY Bilateral    cataracts  . REVISON OF ARTERIOVENOUS FISTULA Left 02/09/2014   Procedure: REVISON OF LEFT ARTERIOVENOUS FISTULA - RESECTION OF RENDUNDANT VEIN;  Surgeon: Angelia Mould, MD;  Location: [facility];  Service: Vascular;  Laterality: Left;  . SBO with lysis adhesions    . SHUNTOGRAM Left 04/19/2014   Procedure: FISTULOGRAM;  Surgeon: Angelia Mould, MD;  Location[facility] CATH LAB;  Service: Cardiovascular;  Laterality: Left;  . UNILATERAL UPPER EXTREMEITY ANGIOGRAM N/A  07/12/2014   Procedure: UNILATERAL UPPER Ansel[place] Rod;  Surgeon: Angelia Mould, MD;  Location: [facility] CATH LAB;  Service: Cardiovascular;  Laterality: N/A;    Family History  Problem Relation Age of Onset  . Aneurysm Mother   . Heart disease Father   . Stroke Father   . Hypertension Father   . Diabetes Father   . Dementia Neg Hx     Social History:  reports that he quit smoking about 22 years ago. He has never used smokeless tobacco. He reports that he does not drink alcohol or use drugs.    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 Hgb A1c MFr Bld 05/09/2017 8.6* 4.6 - 6.5 % Final   Glycemic Control Guidelines for People with Diabetes:Non Diabetic:  <6%Goal of Therapy: <7%Additional Action Suggested:  >8%   . Sodium 05/09/2017 137  135 - 145 mEq/L Final  . Potassium 05/09/2017 4.0  3.5 - 5.1 mEq/L Final  . Chloride 05/09/2017 93* 96 - 112 mEq/L Final  . CO2 05/09/2017 34* 19 - 32 mEq/L Final  . Glucose, Bld 05/09/2017 255* 70 - 99 mg/dL Final  . BUN 05/09/2017 25* 6 - 23 mg/dL Final  . Creatinine, Ser 05/09/2017 5.58* 0.40 - 1.50 mg/dL Final  . Total Bilirubin 05/09/2017 0.3  0.2 - 1.2 mg/dL Final  . Alkaline Phosphatase 05/09/2017 96  39 - 117 U/L Final  . AST 05/09/2017 26  0 - 37 U/L Final  . ALT 05/09/2017 16  0 - 53 U/L Final  . Total Protein 05/09/2017 6.6  6.0 - 8.3 g/dL Final  . Albumin 05/09/2017 3.8  3.5 - 5.2 g/dL Final  . Calcium 05/09/2017 9.2  8.4 - 10.5  mg/dL Final  . GFR 05/09/2017 12.86* >60.00 mL/min Final    Physical Examination:  BP 134/68   Pulse 67   Ht 5' 11"  (1.803 m)   Wt 198 lb (89.8 kg)   SpO2 98%   BMI 27.62 kg/m       ASSESSMENT:  Diabetes type 2, uncontrolled See history of present illness for detailed discussion of his current management, blood sugar patterns and problems identified  His A1c is 8.6, previously 7.4 Difficult to get a good idea of his blood sugar patterns as he did not bring his monitor and does not remember his readings properly He thinks his blood sugars are overall very well controlled but cannot explain his high A1c Also his lab glucose was 255 without any unusual dietary intake that day He has also reduce  his LANTUS by 3 units on his own for no apparent reason  However may be benefiting from starting back on Actos and continuing this Overall appears to be needing relatively higher dose of mealtime insulin   PLAN:  He will bring his monitor for download for further evaluation of his blood sugar patterns Continue Actos Try to take the 5 units of NovoLog right before diagnosis more consistently Reminded him to check more readings after meals especially supper  There are no Patient Instructions on file for this visit.        Brendi Mccarroll 05/14/2017, 10:07 AM   Note: This office note was prepared with Estate agent. Any transcriptional errors that result from this process are unintentional.

## 2017-05-15 DIAGNOSIS — N2581 Secondary hyperparathyroidism of renal origin: Secondary | ICD-10-CM | POA: Diagnosis not present

## 2017-05-15 DIAGNOSIS — D509 Iron deficiency anemia, unspecified: Secondary | ICD-10-CM | POA: Diagnosis not present

## 2017-05-15 DIAGNOSIS — Z23 Encounter for immunization: Secondary | ICD-10-CM | POA: Diagnosis not present

## 2017-05-15 DIAGNOSIS — N186 End stage renal disease: Secondary | ICD-10-CM | POA: Diagnosis not present

## 2017-05-15 DIAGNOSIS — D631 Anemia in chronic kidney disease: Secondary | ICD-10-CM | POA: Diagnosis not present

## 2017-05-15 DIAGNOSIS — E1129 Type 2 diabetes mellitus with other diabetic kidney complication: Secondary | ICD-10-CM | POA: Diagnosis not present

## 2017-05-16 ENCOUNTER — Encounter: Payer: Medicare Other | Admitting: Surgery

## 2017-05-16 DIAGNOSIS — Z992 Dependence on renal dialysis: Secondary | ICD-10-CM | POA: Diagnosis not present

## 2017-05-16 DIAGNOSIS — E11622 Type 2 diabetes mellitus with other skin ulcer: Secondary | ICD-10-CM | POA: Diagnosis not present

## 2017-05-16 DIAGNOSIS — L98499 Non-pressure chronic ulcer of skin of other sites with unspecified severity: Secondary | ICD-10-CM | POA: Diagnosis not present

## 2017-05-16 DIAGNOSIS — E1122 Type 2 diabetes mellitus with diabetic chronic kidney disease: Secondary | ICD-10-CM | POA: Diagnosis not present

## 2017-05-16 DIAGNOSIS — S31102D Unspecified open wound of abdominal wall, epigastric region without penetration into peritoneal cavity, subsequent encounter: Secondary | ICD-10-CM | POA: Diagnosis not present

## 2017-05-16 DIAGNOSIS — I120 Hypertensive chronic kidney disease with stage 5 chronic kidney disease or end stage renal disease: Secondary | ICD-10-CM | POA: Diagnosis not present

## 2017-05-16 DIAGNOSIS — N186 End stage renal disease: Secondary | ICD-10-CM | POA: Diagnosis not present

## 2017-05-17 DIAGNOSIS — N2581 Secondary hyperparathyroidism of renal origin: Secondary | ICD-10-CM | POA: Diagnosis not present

## 2017-05-17 DIAGNOSIS — N186 End stage renal disease: Secondary | ICD-10-CM | POA: Diagnosis not present

## 2017-05-17 DIAGNOSIS — D509 Iron deficiency anemia, unspecified: Secondary | ICD-10-CM | POA: Diagnosis not present

## 2017-05-17 DIAGNOSIS — E1129 Type 2 diabetes mellitus with other diabetic kidney complication: Secondary | ICD-10-CM | POA: Diagnosis not present

## 2017-05-17 DIAGNOSIS — D631 Anemia in chronic kidney disease: Secondary | ICD-10-CM | POA: Diagnosis not present

## 2017-05-17 DIAGNOSIS — Z23 Encounter for immunization: Secondary | ICD-10-CM | POA: Diagnosis not present

## 2017-05-18 NOTE — Progress Notes (Signed)
[name], [name] ([id number]) Visit Report for 05/16/2017 Chief Complaint Document Details Patient Name: [name], Age. Date of Service: 05/16/2017 11:15 AM Medical Record Number: [id number] Patient Account Number: [id number] Date of Birth/Sex: [birth year] (76 y.o. Male) Treating RN: Montey Hora Primary Care Provider: Tedra Senegal Other Clinician: Referring Provider: Tedra Senegal Treating Provider/Extender: Frann Rider in Treatment: 15 Information Obtained from: Patient Chief Complaint Patients presents for treatment of an open diabetic ulcer to the periumbilical area. he had a similar problem in January of this year and the ulcer healed with offloading and local care Electronic Signature(s) Signed: 05/16/2017 3:24:30 PM By: Christin Fudge MD, FACS Entered By: Christin Fudge on 05/16/2017 12:23:34 Teegarden, [name] ([id number]) -------------------------------------------------------------------------------- HPI Details Patient Name: [name]. Date of Service: 05/16/2017 11:15 AM Medical Record Number: [id number] Patient Account Number: [id number] Date of Birth/Sex: June 23, 1941 (76 y.o. Male) Treating RN: Montey Hora Primary Care Provider: Tedra Senegal Other Clinician: Referring Provider: Tedra Senegal Treating Provider/Extender: Frann Rider in Treatment: 15 History of Present Illness Location: periumbilical area. is on the left of the midline Quality: Patient reports No Pain. Severity: Patient states wound (s) are getting better. Duration: Patient has had the wound for < 2 weeks prior to presenting for treatment Context: The wound appeared gradually over time Modifying Factors: Patient is currently on renal dialysis and receives treatments 3 times weekly Associated Signs and Symptoms: Patient reports having:then local care with hydrogen peroxide and Bactroban ointment HPI Description: 76 year old gentleman seen earlier this year in January for a abdominal  wound is now back with the same problem which has recurred for about 2 weeks and he has been trying to apply some local antibiotic ointment and a Band-Aid there. Of note he has significant pressure in this area due to his beltline and may have put on some weight which results in his trousers being very tight around the waist. The else has changed in his HandP and his diabetes is pretty well controlled. most recent hemoglobin A1c on 12/06/2016 was 7.8%. he was reviewed by his endocrinologist Dr. Dwyane Dee who made appropriate modifications to his treatment plan. he was recently seen by neurology for mild cognitive impairment that was found to be normal for his age 57/21/18 patient appears to be doing well in regard to his abdominal wound area on evaluation today. He has been tolerating the dressing changes with the [facility] Dressing I'm pleased with how this has progressed and in fact the wound appears to be completely healed on evaluation today. There is definitely no evidence of infection. 03/29/17 Patient's wound on the abdominal region appears to be completely healed on evaluation today. This wound has been doing very well and I think the biggest thing is going to be keeping it from reactivation where it rubbed on his pants. 04/18/2017 -- he was recently discharged about 3 weeks ago and was using a binder which probably caused him abrasion on the area of the recently healed scar. 05/02/17 on evaluation today patient appears to be doing fairly well in regard to his abdominal wound at the site of the surgical scar. Unfortunately the scar tissue is very fragile and tends to reopen. He did get a small abdominal binder to try to pad the area in order to offload and prevent his pants from rubbing specifically at the site. With that being said he tells me that Dr. Con Memos was concerned that the binder might actually have rub the area causing a reopening. I did have a look  at this today and unfortunately  it appears that he was putting this over his pants which was making matters worse. 05/09/17 on evaluation today patient continues to do well with the [facility] Dressing. There is a slight skin tear inferior to the wound that we have been treating that may have been due to the dressing sticking slightly. This is minimal however. I did discuss with patient prior to removal of the dressing for dressing [place], MCCABE GLORIA. ([id number]) changes that they whet this in order to loosen it and not risk damaging in the field. Nonetheless it appears that they have been doing very well with the dressing changes he had his wife took over the dressing changes as she states that he was doing it wrong. Overall though I'm pleased with how things are progressing. No fevers, chills, nausea, or vomiting noted at this time. ====== Old notes The 76 year old gentleman who has a past medical history of diabetes mellitus, end-stage renal disease on dialysis, hypertension, secondary hyperparathyroidism, peripheral vascular disease and history of previous arterial bifemoral bypass graft in 1992 also has a history of gout, hyperlipidemia, hypothyroidism. last hemoglobin A1c was 7.5%. status post AV fistula placement, breast surgery for left granulomatous mastitis, triple a repair in 1992, small bowel obstruction, upper arm angiograms for AV fistula on the left side. was noted to have a superficial skin wound in the periumbilical area since late December 2017. He was asked to clean it with hydrogen peroxide and apply Bactroban. He was seen by vascular surgery in October 2017 where his right ABI was 0.56 left ABI was 0.87 and right TBI was 0.69 and left TBI was 0.70 ===== Electronic Signature(s) Signed: 05/16/2017 3:24:30 PM By: Christin Fudge MD, FACS Entered By: Christin Fudge on 05/16/2017 12:23:40 Thielke, [name] ([id number]) -------------------------------------------------------------------------------- Physical  Exam Details Patient Name: [name]. Date of Service: 05/16/2017 11:15 AM Medical Record Number: [id number] Patient Account Number: [id number] Date of Birth/Sex: 1941-07-29 (76 y.o. Male) Treating RN: Montey Hora Primary Care Provider: Tedra Senegal Other Clinician: Referring Provider: Tedra Senegal Treating Provider/Extender: Frann Rider in Treatment: 15 Constitutional . Pulse regular. Respirations normal and unlabored. Afebrile. . Eyes Nonicteric. Reactive to light. Ears, Nose, Mouth, and Throat Lips, teeth, and gums WNL.Marland Kitchen Moist mucosa without lesions. Neck supple and nontender. No palpable supraclavicular or cervical adenopathy. Normal sized without goiter. Respiratory WNL. No retractions.. Cardiovascular Pedal Pulses WNL. No clubbing, cyanosis or edema. Lymphatic No adneopathy. No adenopathy. No adenopathy. Musculoskeletal Adexa without tenderness or enlargement.. Digits and nails w/o clubbing, cyanosis, infection, petechiae, ischemia, or inflammatory conditions.. Integumentary (Hair, Skin) No suspicious lesions. No crepitus or fluctuance. No peri-wound warmth or erythema. No masses.Marland Kitchen Psychiatric Judgement and insight Intact.. No evidence of depression, anxiety, or agitation.. Notes The wound has healed and he has a supple scar. Electronic Signature(s) Signed: 05/16/2017 3:24:30 PM By: Christin Fudge MD, FACS Entered By: Christin Fudge on 05/16/2017 12:24:40 Borunda, [name] ([id number]) -------------------------------------------------------------------------------- Physician Orders Details Patient Name: [name]. Date of Service: 05/16/2017 11:15 AM Medical Record Number: [id number] Patient Account Number: [id number] Date of Birth/Sex: July 14, 1941 (76 y.o. Male) Treating RN: Cornell Barman Primary Care Provider: Tedra Senegal Other Clinician: Referring Provider: Tedra Senegal Treating Provider/Extender: Frann Rider in Treatment: 15 Verbal / Phone  Orders: No Diagnosis Coding Discharge From [facility] Services o Discharge from [place] - treatment complete Electronic Signature(s) Signed: 05/16/2017 3:24:30 PM By: Christin Fudge MD, FACS Signed: 05/16/2017 7:53:58 PM By: Gretta Cool, BSN, RN, CWS, Kim RN,  BSN Entered By: Gretta Cool, BSN, RN, CWS, Kim on 05/16/2017 12:13:44 Placencia, [name] ([id number]) -------------------------------------------------------------------------------- Problem List Details Patient Name: [name]. Date of Service: 05/16/2017 11:15 AM Medical Record Number: [id number] Patient Account Number: [id number] Date of Birth/Sex: Sep 06, 1941 (76 y.o. Male) Treating RN: Montey Hora Primary Care Provider: Tedra Senegal Other Clinician: Referring Provider: Tedra Senegal Treating Provider/Extender: Frann Rider in Treatment: 15 Active Problems ICD-10 Encounter Code Description Active Date Diagnosis E11.622 Type 2 diabetes mellitus with other skin ulcer 01/28/2017 Yes S31.102D Unspecified open wound of abdominal wall, epigastric 01/28/2017 Yes region without penetration into peritoneal cavity, subsequent encounter Z99.2 Dependence on renal dialysis 01/28/2017 Yes Inactive Problems Resolved Problems Electronic Signature(s) Signed: 05/16/2017 3:24:30 PM By: Christin Fudge MD, FACS Entered By: Christin Fudge on 05/16/2017 12:23:22 Mcwherter, [name] ([id number]) -------------------------------------------------------------------------------- Progress Note Details Patient Name: [name]. Date of Service: 05/16/2017 11:15 AM Medical Record Number: [id number] Patient Account Number: [id number] Date of Birth/Sex: Feb 07, 1941 (76 y.o. Male) Treating RN: Montey Hora Primary Care Provider: Tedra Senegal Other Clinician: Referring Provider: Tedra Senegal Treating Provider/Extender: Frann Rider in Treatment: 15 Subjective Chief Complaint Information obtained from Patient Patients presents for treatment of an  open diabetic ulcer to the periumbilical area. he had a similar problem in January of this year and the ulcer healed with offloading and local care History of Present Illness (HPI) The following HPI elements were documented for the patient's wound: Location: periumbilical area. is on the left of the midline Quality: Patient reports No Pain. Severity: Patient states wound (s) are getting better. Duration: Patient has had the wound for < 2 weeks prior to presenting for treatment Context: The wound appeared gradually over time Modifying Factors: Patient is currently on renal dialysis and receives treatments 3 times weekly Associated Signs and Symptoms: Patient reports having:then local care with hydrogen peroxide and Bactroban ointment 76 year old gentleman seen earlier this year in January for a abdominal wound is now back with the same problem which has recurred for about 2 weeks and he has been trying to apply some local antibiotic ointment and a Band-Aid there. Of note he has significant pressure in this area due to his beltline and may have put on some weight which results in his trousers being very tight around the waist. The else has changed in his HandP and his diabetes is pretty well controlled. most recent hemoglobin A1c on 12/06/2016 was 7.8%. he was reviewed by his endocrinologist Dr. Dwyane Dee who made appropriate modifications to his treatment plan. he was recently seen by neurology for mild cognitive impairment that was found to be normal for his age 68/21/18 patient appears to be doing well in regard to his abdominal wound area on evaluation today. He has been tolerating the dressing changes with the [facility] Dressing I'm pleased with how this has progressed and in fact the wound appears to be completely healed on evaluation today. There is definitely no evidence of infection. 03/29/17 Patient's wound on the abdominal region appears to be completely healed on evaluation today.  This wound has been doing very well and I think the biggest thing is going to be keeping it from reactivation where it rubbed on his pants. 04/18/2017 -- he was recently discharged about 3 weeks ago and was using a binder which probably caused him abrasion on the area of the recently healed scar. 05/02/17 on evaluation today patient appears to be doing fairly well in regard to his abdominal wound at the [place]meyer, RAHMON HEIGL. ([id number])  site of the surgical scar. Unfortunately the scar tissue is very fragile and tends to reopen. He did get a small abdominal binder to try to pad the area in order to offload and prevent his pants from rubbing specifically at the site. With that being said he tells me that Dr. Con Memos was concerned that the binder might actually have rub the area causing a reopening. I did have a look at this today and unfortunately it appears that he was putting this over his pants which was making matters worse. 05/09/17 on evaluation today patient continues to do well with the [facility] Dressing. There is a slight skin tear inferior to the wound that we have been treating that may have been due to the dressing sticking slightly. This is minimal however. I did discuss with patient prior to removal of the dressing for dressing changes that they whet this in order to loosen it and not risk damaging in the field. Nonetheless it appears that they have been doing very well with the dressing changes he had his wife took over the dressing changes as she states that he was doing it wrong. Overall though I'm pleased with how things are progressing. No fevers, chills, nausea, or vomiting noted at this time. ====== Old notes The 76 year old gentleman who has a past medical history of diabetes mellitus, end-stage renal disease on dialysis, hypertension, secondary hyperparathyroidism, peripheral vascular disease and history of previous arterial bifemoral bypass graft in 1992 also has a history  of gout, hyperlipidemia, hypothyroidism. last hemoglobin A1c was 7.5%. status post AV fistula placement, breast surgery for left granulomatous mastitis, triple a repair in 1992, small bowel obstruction, upper arm angiograms for AV fistula on the left side. was noted to have a superficial skin wound in the periumbilical area since late December 2017. He was asked to clean it with hydrogen peroxide and apply Bactroban. He was seen by vascular surgery in October 2017 where his right ABI was 0.56 left ABI was 0.87 and right TBI was 0.69 and left TBI was 0.70 ===== Objective Constitutional Pulse regular. Respirations normal and unlabored. Afebrile. Vitals Time Taken: 11:38 AM, Height: 72 in, Weight: 201 lbs, BMI: 27.3, Temperature: 97.8 F, Pulse: 65 bpm, Respiratory Rate: 16 breaths/min, Blood Pressure: 117/39 mmHg. Eyes Nonicteric. Reactive to light. Ears, Nose, Mouth, and Throat Lips, teeth, and gums WNL.Marland Kitchen Moist mucosa without lesions. Shatto, HAKIM MINNIEFIELD ([id number]) Neck supple and nontender. No palpable supraclavicular or cervical adenopathy. Normal sized without goiter. Respiratory WNL. No retractions.. Cardiovascular Pedal Pulses WNL. No clubbing, cyanosis or edema. Lymphatic No adneopathy. No adenopathy. No adenopathy. Musculoskeletal Adexa without tenderness or enlargement.. Digits and nails w/o clubbing, cyanosis, infection, petechiae, ischemia, or inflammatory conditions.Marland Kitchen Psychiatric Judgement and insight Intact.. No evidence of depression, anxiety, or agitation.. General Notes: The wound has healed and he has a supple scar. Integumentary (Hair, Skin) No suspicious lesions. No crepitus or fluctuance. No peri-wound warmth or erythema. No masses.. Wound #3 status is Healed - Epithelialized. Original cause of wound was Gradually Appeared. The wound is located on the Medial Abdomen - midline. The wound measures 0cm length x 0cm width x 0cm depth; 0cm^2 area and 0cm^3 volume.  There is a large amount of sanguinous drainage noted. The wound margin is flat and intact. There is large (67-100%) red granulation within the wound bed. There is no necrotic tissue within the wound bed. The periwound skin appearance did not exhibit: Callus, Crepitus, Excoriation, Induration, Rash, Scarring, Dry/Scaly, Maceration, Atrophie Blanche, Cyanosis, Ecchymosis,  Hemosiderin Staining, Mottled, Pallor, Rubor, Erythema. Periwound temperature was noted as No Abnormality. Assessment Active Problems ICD-10 E11.622 - Type 2 diabetes mellitus with other skin ulcer S31.102D - Unspecified open wound of abdominal wall, epigastric region without penetration into peritoneal cavity, subsequent encounter Z99.2 - Dependence on renal dialysis Rosado, AKEEM HEPPLER ([id number]) Plan Discharge From [facility] Services: Discharge from [place] - treatment complete the patient's wound has completely healed and I have spent some time discussing with him the need to protect this supple scar well. I have recommended a forearm border followed by a pad to prevent his belt from rubbing against the area. He is urged to continue to offload this area as well as possible and come back to see [place] only if needed. He is otherwise discharged from the wound care center. Electronic Signature(s) Signed: 05/16/2017 3:24:30 PM By: Christin Fudge MD, FACS Entered By: Christin Fudge on 05/16/2017 12:25:35 Tumbleson, [name] ([id number]) -------------------------------------------------------------------------------- SuperBill Details Patient Name: [name]. Date of Service: 05/16/2017 Medical Record Number: [id number] Patient Account Number: [id number] Date of Birth/Sex: 13-Jan-1941 (76 y.o. Male) Treating RN: Montey Hora Primary Care Provider: Tedra Senegal Other Clinician: Referring Provider: Tedra Senegal Treating Provider/Extender: Frann Rider in Treatment: 15 Diagnosis Coding ICD-10 Codes Code  Description E11.622 Type 2 diabetes mellitus with other skin ulcer Unspecified open wound of abdominal wall, epigastric region without penetration into S31.102D peritoneal cavity, subsequent encounter Z99.2 Dependence on renal dialysis Facility Procedures CPT4 Code: [id number] Description: [phone] - WOUND CARE VISIT-LEV 1 EST PT Modifier: Quantity: 1 Physician Procedures CPT4: Description Modifier Quantity Code [id number] - WC PHYS LEVEL 2 - EST PT 1 ICD-10 Description Diagnosis E11.622 Type 2 diabetes mellitus with other skin ulcer S31.102D Unspecified open wound of abdominal wall, epigastric region without  penetration into peritoneal cavity, subsequent encounter Z99.2 Dependence on renal dialysis Electronic Signature(s) Signed: 05/16/2017 3:24:30 PM By: Christin Fudge MD, FACS Entered By: Christin Fudge on 05/16/2017 12:25:50

## 2017-05-18 NOTE — Progress Notes (Addendum)
[name], [name] ([id number]) Visit Report for 05/16/2017 Arrival Information Details Patient Name: [name], [name]. Date of Service: 05/16/2017 11:15 AM Medical Record Number: [id number] Patient Account Number: [id number] Date of Birth/Sex: [birth year] (76 y.o. Male) Treating RN: Cornell Barman Primary Care Cloyce Paterson: Tedra Senegal Other Clinician: Referring Geffrey Michaelsen: Tedra [place]egal Treating Peja Allender/Extender: Frann Rider in Treatment: 15 Visit Information History Since Last Visit Added or deleted any medications: No Patient Arrived: Ambulatory Any new allergies or adverse reactions: No Arrival Time: 11:37 Had a fall or experienced change in No Accompanied By: self activities of daily living that may affect Transfer Assistance: None risk of falls: Patient Identification Verified: Yes Signs or symptoms of abuse/neglect since last No Secondary Verification Process Yes visito Completed: Hospitalized since last visit: No Patient Has Alerts: Yes Has Dressing in Place as Prescribed: Yes Patient Alerts: Patient on Blood Pain Present Now: No Thinner DMII aspirin 650mg  Electronic Signature(s) Signed: 05/16/2017 7:53:58 PM By: Gretta Cool, BSN, RN, CWS, Kim RN, BSN Entered By: Gretta Cool, BSN, RN, CWS, Kim on 05/16/2017 11:38:02 Bro[place], [name] ([id number]) -------------------------------------------------------------------------------- Clinic Level of Care Assessment Details Patient Name: [name], [name]. Date of Service: 05/16/2017 11:15 AM Medical Record Number: [id number] Patient Account Number: [id number] Date of Birth/Sex: November 01, 1940 (76 y.o. Male) Treating RN: Cornell Barman Primary Care Lanya Bucks: Tedra Senegal Other Clinician: Referring Jaquon Gingerich: Tedra [place]egal Treating Brooklyn Jeff/Extender: Frann Rider in Treatment: 15 Clinic Level of Care Assessment Items TOOL 4 Quantity Score []  - Use when only an EandM is performed on FOLLOW-UP visit 0 ASSESSMENTS - Nursing Assessment /  Reassessment []  - Reassessment of Co-morbidities (includes updates in patient status) 0 X - Reassessment of Adherence to Treatment Plan 1 5 ASSESSMENTS - Wound and Skin Assessment / Reassessment X - Simple Wound Assessment / Reassessment - one wound 1 5 []  - Complex Wound Assessment / Reassessment - multiple wounds 0 []  - Dermatologic / Skin Assessment (not related to wound area) 0 ASSESSMENTS - Focused Assessment []  - Circumferential Edema Measurements - multi extremities 0 []  - Nutritional Assessment / Counseling / Intervention 0 []  - Lower Extremity Assessment (monofilament, tuning fork, pulses) 0 []  - Peripheral Arterial Disease Assessment (using hand held doppler) 0 ASSESSMENTS - Ostomy and/or Continence Assessment and Care []  - Incontinence Assessment and Management 0 []  - Ostomy Care Assessment and Management (repouching, etc.) 0 PROCESS - Coordination of Care []  - Simple Patient / Family Education for ongoing care 0 []  - Complex (extensive) Patient / Family Education for ongoing care 0 []  - Staff obtains Programmer, systems, Records, Test Results / Process Orders 0 []  - Staff telephones HHA, Nursing Homes / Clarify orders / etc 0 []  - Routine Transfer to another Facility (non-emergent condition) 0 Aguilar, [name] ([id number]) []  - Routine Hospital Admission (non-emergent condition) 0 []  - New Admissions / Biomedical engineer / Ordering NPWT, Apligraf, etc. 0 []  - Emergency Hospital Admission (emergent condition) 0 []  - Simple Discharge Coordination 0 []  - Complex (extensive) Discharge Coordination 0 PROCESS - Special Needs []  - Pediatric / Minor Patient Management 0 []  - Isolation Patient Management 0 []  - Hearing / Language / Visual special needs 0 []  - Assessment of Community assistance (transportation, D/C planning, etc.) 0 []  - Additional assistance / Altered mentation 0 []  - Support Surface(s) Assessment (bed, cushion, seat, etc.) 0 INTERVENTIONS - Wound Cleansing /  Measurement []  - Simple Wound Cleansing - one wound 0 []  - Complex Wound Cleansing - multiple wounds 0 []  - Wound Imaging (photographs - any  number of wounds) 0 []  - Wound Tracing (instead of photographs) 0 []  - Simple Wound Measurement - one wound 0 []  - Complex Wound Measurement - multiple wounds 0 INTERVENTIONS - Wound Dressings X - Small Wound Dressing one or multiple wounds 1 10 []  - Medium Wound Dressing one or multiple wounds 0 []  - Large Wound Dressing one or multiple wounds 0 []  - Application of Medications - topical 0 []  - Application of Medications - injection 0 INTERVENTIONS - Miscellaneous []  - External ear exam 0 [place]agues[place], [name] ([id number]) []  - Specimen Collection (cultures, biopsies, blood, body fluids, etc.) 0 []  - Specimen(s) / Culture(s) sent or taken to Lab for analysis 0 []  - Patient Transfer (multiple staff / Harrel Lemon Lift / Similar devices) 0 []  - Simple Staple / Suture removal (25 or less) 0 []  - Complex Staple / Suture removal (26 or more) 0 []  - Hypo / Hyperglycemic Management (close monitor of Blood Glucose) 0 []  - Ankle / Brachial Index (ABI) - do not check if billed separately 0 X - Vital Signs 1 5 Has the patient been seen at the hospital within the last three years: Yes Total Score: 25 Level Of Care: New/Established - Level 1 Electronic Signature(s) Signed: 05/16/2017 7:53:58 PM By: Gretta Cool, BSN, RN, CWS, Kim RN, BSN Entered By: Gretta Cool, BSN, RN, CWS, Kim on 05/16/2017 12:14:25 [name], [name] ([id number]) -------------------------------------------------------------------------------- Encounter Discharge Information Details Patient Name: [name]. Date of Service: 05/16/2017 11:15 AM Medical Record Number: [id number] Patient Account Number: [id number] Date of Birth/Sex: 12/14/1940 (76 y.o. Male) Treating RN: Montey Hora Primary Care Obed Samek: Tedra Senegal Other Clinician: Referring Mayo Owczarzak: Tedra [place]egal Treating Siara Gorder/Extender: Frann Rider in Treatment: 15 Encounter Discharge Information Items Discharge Pain Level: 0 Discharge Condition: Stable Ambulatory Status: Ambulatory Discharge Destination: Home Transportation: Private Auto Accompanied By: self Schedule Follow-up Appointment: Yes Medication Reconciliation completed and provided to Patient/Care Yes Cainen Burnham: Provided on Clinical Summary of Care: 05/16/2017 Form Type Recipient Paper Patient WT Electronic Signature(s) Signed: 05/16/2017 7:53:58 PM By: Gretta Cool, BSN, RN, CWS, Kim RN, BSN Entered By: Gretta Cool, BSN, RN, CWS, Kim on 05/16/2017 12:14:48 [name], [name] ([id number]) -------------------------------------------------------------------------------- Lower Extremity Assessment Details Patient Name: [name]. Date of Service: 05/16/2017 11:15 AM Medical Record Number: [id number] Patient Account Number: [id number] Date of Birth/Sex: 1941/03/18 (76 y.o. Male) Treating RN: Cornell Barman Primary Care Katriel Cutsforth: Tedra Senegal Other Clinician: Referring Tye Vigo: Tedra Senegal Treating Jordane Hisle/Extender: Frann Rider in Treatment: 15 Electronic Signature(s) Signed: 05/16/2017 7:53:58 PM By: Gretta Cool, BSN, RN, CWS, Kim RN, BSN Entered By: Gretta Cool, BSN, RN, CWS, Kim on 05/16/2017 11:42:49 [name], [name] ([id number]) -------------------------------------------------------------------------------- Multi Wound Chart Details Patient Name: [name]. Date of Service: 05/16/2017 11:15 AM Medical Record Number: [id number] Patient Account Number: [id number] Date of Birth/Sex: 1941-01-31 (76 y.o. Male) Treating RN: Cornell Barman Primary Care Nimrit Kehres: Tedra Senegal Other Clinician: Referring Cillian Gwinner: Tedra Senegal Treating Shaunna Rosetti/Extender: Frann Rider in Treatment: 15 Vital Signs Height(in): 72 Pulse(bpm): 65 Weight(lbs): 201 Blood Pressure 117/39 (mmHg): Body Mass Index(BMI): 27 Temperature(F): 97.8 Respiratory  Rate 16 (breaths/min): Photos: [N/A:N/A] Wound Location: Medial Abdomen - midline N/A N/A Wounding Event: Gradually Appeared N/A N/A Primary Etiology: Pressure Ulcer N/A N/A Comorbid History: Anemia, Hypertension, N/A N/A Peripheral Venous Disease, Type II Diabetes, Gout, Neuropathy Date Acquired: 04/14/2017 N/A N/A Weeks of Treatment: 4 N/A N/A Wound Status: Healed - Epithelialized N/A N/A Measurements L x W x D 0x0x0 N/A N/A (cm) Area (cm) : 0 N/A N/A Volume (  cm) : 0 N/A N/A % Reduction in Area: 100.00% N/A N/A % Reduction in Volume: 100.00% N/A N/A Classification: Category/Stage II N/A N/A Exudate Amount: Large N/A N/A Exudate Type: Sanguinous N/A N/A Exudate Color: red N/A N/A Wound Margin: Flat and Intact N/A N/A Granulation Amount: Large (67-100%) N/A N/A Granulation Quality: Red N/A N/A Necrotic Amount: None Present (0%) N/A N/A Cimino, [name] ([id number]) Exposed Structures: Fascia: No N/A N/A Fat Layer (Subcutaneous Tissue) Exposed: No Tendon: No Muscle: No Joint: No Bone: No Epithelialization: Small (1-33%) N/A N/A Periwound Skin Texture: Excoriation: No N/A N/A Induration: No Callus: No Crepitus: No Rash: No Scarring: No Periwound Skin Maceration: No N/A N/A Moisture: Dry/Scaly: No Periwound Skin Color: Atrophie Blanche: No N/A N/A Cyanosis: No Ecchymosis: No Erythema: No Hemosiderin Staining: No Mottled: No Pallor: No Rubor: No Temperature: No Abnormality N/A N/A Tenderness on No N/A N/A Palpation: Wound Preparation: Ulcer Cleansing: N/A N/A Rinsed/Irrigated with Saline Topical Anesthetic Applied: None, Other: lidocaine 4% Treatment Notes Electronic Signature(s) Signed: 05/16/2017 3:24:30 PM By: Christin Fudge MD, FACS Entered By: Christin Fudge on 05/16/2017 12:23:27 [name], [name] ([id number]) -------------------------------------------------------------------------------- [place] Details Patient Name: [name]. Date of Service: 05/16/2017 11:15 AM Medical Record Number: [id number] Patient Account Number: [id number] Date of Birth/Sex: [birth year] (76 y.o. Male) Treating RN: Cornell Barman Primary Care Janzen Sacks: Tedra Senegal Other Clinician: Referring Cristal Qadir: Tedra [place]egal Treating Aviela Blundell/Extender: Frann Rider in Treatment: 15 Active Inactive Electronic Signature(s) Signed: 05/21/2017 5:02:20 PM By: Gretta Cool, BSN, RN, CWS, Kim RN, BSN Previous Signature: 05/16/2017 7:53:58 PM Version By: Gretta Cool, BSN, RN, CWS, Kim RN, BSN Entered By: Gretta Cool, BSN, RN, CWS, Kim on 05/21/2017 10:54:38 [name], [name] ([id number]) -------------------------------------------------------------------------------- Pain Assessment Details Patient Name: [name]. Date of Service: 05/16/2017 11:15 AM Medical Record Number: [id number] Patient Account Number: [id number] Date of Birth/Sex: 05/05/1941 (76 y.o. Male) Treating RN: Cornell Barman Primary Care Mikolaj Woolstenhulme: Tedra Senegal Other Clinician: Referring Saint Hank: Tedra [place] Treating Lional Icenogle/Extender: Frann Rider in Treatment: 15 Active Problems Location of Pain Severity and Description of Pain Patient Has Paino No Site Locations With Dressing Change: No Pain Management and Medication Current Pain Management: Goals for Pain Management Topical or injectable lidocaine is offered to patient for acute pain when surgical debridement is performed. If needed, Patient is instructed to use over the counter pain medication for the following 24-48 hours after debridement. Wound care MDs do not prescribed pain medications. Patient has chronic pain or uncontrolled pain. Patient has been instructed to make an appointment with their Primary Care Physician for pain management. Electronic Signature(s) Signed: 05/16/2017 7:53:58 PM By: Gretta Cool, BSN, RN, CWS, Kim RN, BSN Entered By: Gretta Cool, BSN, RN, CWS, Kim on 05/16/2017 11:38:30 [name], [name]  ([id number]) -------------------------------------------------------------------------------- Patient/Caregiver Education Details Patient Name: [name]. Date of Service: 05/16/2017 11:15 AM Medical Record Number: [id number] Patient Account Number: [id number] Date of Birth/Gender: 1940/11/04 (76 y.o. Male) Treating RN: Cornell Barman Primary Care Physician: Tedra Senegal Other Clinician: Referring Physician: Tedra [place]egal Treating Physician/Extender: Frann Rider in Treatment: 15 Education Assessment Education Provided To: Patient Education Topics Provided Wound/Skin Impairment: Handouts: Other: completed Engineer, water) Signed: 05/16/2017 7:53:58 PM By: Gretta Cool, BSN, RN, CWS, Kim RN, BSN Entered By: Gretta Cool, BSN, RN, CWS, Kim on 05/16/2017 12:16:01 [name], [name] ([id number]) -------------------------------------------------------------------------------- Wound Assessment Details Patient Name: [name]. Date of Service: 05/16/2017 11:15 AM Medical Record Number: [id number] Patient Account Number: [id number] Date of Birth/Sex: 11-07-1940 (76 y.o. Male) Treating RN: Gretta Cool,  [place] Primary Care Yutaka Holberg: Tedra Senegal Other Clinician: Referring Franca Stakes: Tedra [place] Treating Avaiah Stempel/Extender: Frann Rider in Treatment: 15 Wound Status Wound Number: 3 Primary Pressure Ulcer Etiology: Wound Location: Medial Abdomen - midline Wound Healed - Epithelialized Wounding Event: Gradually Appeared Status: Date Acquired: 04/14/2017 Comorbid Anemia, Hypertension, Peripheral Weeks Of Treatment: 4 History: Venous Disease, Type II Diabetes, Clustered Wound: No Gout, Neuropathy Photos Photo Uploaded By: Gretta Cool, BSN, RN, CWS, Kim on 05/16/2017 11:46:36 Wound Measurements Length: (cm) 0 % Reduction in Width: (cm) 0 % Reduction in Depth: (cm) 0 Epithelializat Area: (cm) 0 Volume: (cm) 0 Area: 100% Volume: 100% ion: Small (1-33%) Wound  Description Classification: Category/Stage II Foul Odor Afte Wound Margin: Flat and Intact Slough/Fibrino Exudate Amount: Large Exudate Type: Sanguinous Exudate Color: red r Cleansing: No No Wound Bed Granulation Amount: Large (67-100%) Exposed Structure Granulation Quality: Red Fascia Exposed: No Necrotic Amount: None Present (0%) Fat Layer (Subcutaneous Tissue) Exposed: No Tendon Exposed: No Muscle Exposed: No Joint Exposed: No [place], [name] ([id number]) Bone Exposed: No Periwound Skin Texture Texture Color No Abnormalities Noted: No No Abnormalities Noted: No Callus: No Atrophie Blanche: No Crepitus: No Cyanosis: No Excoriation: No Ecchymosis: No Induration: No Erythema: No Rash: No Hemosiderin Staining: No Scarring: No Mottled: No Pallor: No Moisture Rubor: No No Abnormalities Noted: No Dry / Scaly: No Temperature / Pain Maceration: No Temperature: No Abnormality Wound Preparation Ulcer Cleansing: Rinsed/Irrigated with Saline Topical Anesthetic Applied: None, Other: lidocaine 4%, Electronic Signature(s) Signed: 05/16/2017 7:53:58 PM By: Gretta Cool, BSN, RN, CWS, Kim RN, BSN Entered By: Gretta Cool, BSN, RN, CWS, Kim on 05/16/2017 12:12:54 [place], [name] ([id number]) -------------------------------------------------------------------------------- Vitals Details Patient Name: [name]. Date of Service: 05/16/2017 11:15 AM Medical Record Number: [id number] Patient Account Number: [id number] Date of Birth/Sex: 1940/10/01 (76 y.o. Male) Treating RN: Cornell Barman Primary Care Nevaya Nagele: Tedra Senegal Other Clinician: Referring Breven Guidroz: Tedra [place]egal Treating Juli Odom/Extender: Frann Rider in Treatment: 15 Vital Signs Time Taken: 11:38 Temperature (F): 97.8 Height (in): 72 Pulse (bpm): 65 Weight (lbs): 201 Respiratory Rate (breaths/min): 16 Body Mass Index (BMI): 27.3 Blood Pressure (mmHg): 117/39 Reference Range: 80 - 120 mg / dl Electronic  Signature(s) Signed: 05/16/2017 7:53:58 PM By: Gretta Cool, BSN, RN, CWS, Kim RN, BSN Entered By: Gretta Cool, BSN, RN, CWS, Kim on 05/16/2017 11:39:02

## 2017-05-20 DIAGNOSIS — E1129 Type 2 diabetes mellitus with other diabetic kidney complication: Secondary | ICD-10-CM | POA: Diagnosis not present

## 2017-05-20 DIAGNOSIS — N2581 Secondary hyperparathyroidism of renal origin: Secondary | ICD-10-CM | POA: Diagnosis not present

## 2017-05-20 DIAGNOSIS — D509 Iron deficiency anemia, unspecified: Secondary | ICD-10-CM | POA: Diagnosis not present

## 2017-05-20 DIAGNOSIS — Z23 Encounter for immunization: Secondary | ICD-10-CM | POA: Diagnosis not present

## 2017-05-20 DIAGNOSIS — N186 End stage renal disease: Secondary | ICD-10-CM | POA: Diagnosis not present

## 2017-05-20 DIAGNOSIS — D631 Anemia in chronic kidney disease: Secondary | ICD-10-CM | POA: Diagnosis not present

## 2017-05-22 DIAGNOSIS — N2581 Secondary hyperparathyroidism of renal origin: Secondary | ICD-10-CM | POA: Diagnosis not present

## 2017-05-22 DIAGNOSIS — N186 End stage renal disease: Secondary | ICD-10-CM | POA: Diagnosis not present

## 2017-05-22 DIAGNOSIS — E1129 Type 2 diabetes mellitus with other diabetic kidney complication: Secondary | ICD-10-CM | POA: Diagnosis not present

## 2017-05-22 DIAGNOSIS — D631 Anemia in chronic kidney disease: Secondary | ICD-10-CM | POA: Diagnosis not present

## 2017-05-22 DIAGNOSIS — D509 Iron deficiency anemia, unspecified: Secondary | ICD-10-CM | POA: Diagnosis not present

## 2017-05-22 DIAGNOSIS — Z23 Encounter for immunization: Secondary | ICD-10-CM | POA: Diagnosis not present

## 2017-05-24 DIAGNOSIS — Z23 Encounter for immunization: Secondary | ICD-10-CM | POA: Diagnosis not present

## 2017-05-24 DIAGNOSIS — N2581 Secondary hyperparathyroidism of renal origin: Secondary | ICD-10-CM | POA: Diagnosis not present

## 2017-05-24 DIAGNOSIS — N186 End stage renal disease: Secondary | ICD-10-CM | POA: Diagnosis not present

## 2017-05-24 DIAGNOSIS — E1122 Type 2 diabetes mellitus with diabetic chronic kidney disease: Secondary | ICD-10-CM | POA: Diagnosis not present

## 2017-05-24 DIAGNOSIS — D509 Iron deficiency anemia, unspecified: Secondary | ICD-10-CM | POA: Diagnosis not present

## 2017-05-24 DIAGNOSIS — D631 Anemia in chronic kidney disease: Secondary | ICD-10-CM | POA: Diagnosis not present

## 2017-05-24 DIAGNOSIS — Z992 Dependence on renal dialysis: Secondary | ICD-10-CM | POA: Diagnosis not present

## 2017-05-24 DIAGNOSIS — E1129 Type 2 diabetes mellitus with other diabetic kidney complication: Secondary | ICD-10-CM | POA: Diagnosis not present

## 2017-05-27 DIAGNOSIS — N2581 Secondary hyperparathyroidism of renal origin: Secondary | ICD-10-CM | POA: Diagnosis not present

## 2017-05-27 DIAGNOSIS — N186 End stage renal disease: Secondary | ICD-10-CM | POA: Diagnosis not present

## 2017-05-27 DIAGNOSIS — D509 Iron deficiency anemia, unspecified: Secondary | ICD-10-CM | POA: Diagnosis not present

## 2017-05-27 DIAGNOSIS — E1129 Type 2 diabetes mellitus with other diabetic kidney complication: Secondary | ICD-10-CM | POA: Diagnosis not present

## 2017-05-29 DIAGNOSIS — E1129 Type 2 diabetes mellitus with other diabetic kidney complication: Secondary | ICD-10-CM | POA: Diagnosis not present

## 2017-05-29 DIAGNOSIS — N186 End stage renal disease: Secondary | ICD-10-CM | POA: Diagnosis not present

## 2017-05-29 DIAGNOSIS — N2581 Secondary hyperparathyroidism of renal origin: Secondary | ICD-10-CM | POA: Diagnosis not present

## 2017-05-29 DIAGNOSIS — D509 Iron deficiency anemia, unspecified: Secondary | ICD-10-CM | POA: Diagnosis not present

## 2017-05-30 ENCOUNTER — Other Ambulatory Visit: Payer: Self-pay | Admitting: Endocrinology

## 2017-05-31 DIAGNOSIS — N2581 Secondary hyperparathyroidism of renal origin: Secondary | ICD-10-CM | POA: Diagnosis not present

## 2017-05-31 DIAGNOSIS — D509 Iron deficiency anemia, unspecified: Secondary | ICD-10-CM | POA: Diagnosis not present

## 2017-05-31 DIAGNOSIS — E1129 Type 2 diabetes mellitus with other diabetic kidney complication: Secondary | ICD-10-CM | POA: Diagnosis not present

## 2017-05-31 DIAGNOSIS — N186 End stage renal disease: Secondary | ICD-10-CM | POA: Diagnosis not present

## 2017-06-03 DIAGNOSIS — N2581 Secondary hyperparathyroidism of renal origin: Secondary | ICD-10-CM | POA: Diagnosis not present

## 2017-06-03 DIAGNOSIS — N186 End stage renal disease: Secondary | ICD-10-CM | POA: Diagnosis not present

## 2017-06-03 DIAGNOSIS — E1129 Type 2 diabetes mellitus with other diabetic kidney complication: Secondary | ICD-10-CM | POA: Diagnosis not present

## 2017-06-03 DIAGNOSIS — D509 Iron deficiency anemia, unspecified: Secondary | ICD-10-CM | POA: Diagnosis not present

## 2017-06-05 DIAGNOSIS — N186 End stage renal disease: Secondary | ICD-10-CM | POA: Diagnosis not present

## 2017-06-05 DIAGNOSIS — E1129 Type 2 diabetes mellitus with other diabetic kidney complication: Secondary | ICD-10-CM | POA: Diagnosis not present

## 2017-06-05 DIAGNOSIS — N2581 Secondary hyperparathyroidism of renal origin: Secondary | ICD-10-CM | POA: Diagnosis not present

## 2017-06-05 DIAGNOSIS — D509 Iron deficiency anemia, unspecified: Secondary | ICD-10-CM | POA: Diagnosis not present

## 2017-06-07 DIAGNOSIS — D509 Iron deficiency anemia, unspecified: Secondary | ICD-10-CM | POA: Diagnosis not present

## 2017-06-07 DIAGNOSIS — N186 End stage renal disease: Secondary | ICD-10-CM | POA: Diagnosis not present

## 2017-06-07 DIAGNOSIS — E1129 Type 2 diabetes mellitus with other diabetic kidney complication: Secondary | ICD-10-CM | POA: Diagnosis not present

## 2017-06-07 DIAGNOSIS — N2581 Secondary hyperparathyroidism of renal origin: Secondary | ICD-10-CM | POA: Diagnosis not present

## 2017-06-10 DIAGNOSIS — D509 Iron deficiency anemia, unspecified: Secondary | ICD-10-CM | POA: Diagnosis not present

## 2017-06-10 DIAGNOSIS — N186 End stage renal disease: Secondary | ICD-10-CM | POA: Diagnosis not present

## 2017-06-10 DIAGNOSIS — E1129 Type 2 diabetes mellitus with other diabetic kidney complication: Secondary | ICD-10-CM | POA: Diagnosis not present

## 2017-06-10 DIAGNOSIS — N2581 Secondary hyperparathyroidism of renal origin: Secondary | ICD-10-CM | POA: Diagnosis not present

## 2017-06-12 DIAGNOSIS — E1129 Type 2 diabetes mellitus with other diabetic kidney complication: Secondary | ICD-10-CM | POA: Diagnosis not present

## 2017-06-12 DIAGNOSIS — N186 End stage renal disease: Secondary | ICD-10-CM | POA: Diagnosis not present

## 2017-06-12 DIAGNOSIS — N2581 Secondary hyperparathyroidism of renal origin: Secondary | ICD-10-CM | POA: Diagnosis not present

## 2017-06-12 DIAGNOSIS — D509 Iron deficiency anemia, unspecified: Secondary | ICD-10-CM | POA: Diagnosis not present

## 2017-06-14 DIAGNOSIS — E1129 Type 2 diabetes mellitus with other diabetic kidney complication: Secondary | ICD-10-CM | POA: Diagnosis not present

## 2017-06-14 DIAGNOSIS — D509 Iron deficiency anemia, unspecified: Secondary | ICD-10-CM | POA: Diagnosis not present

## 2017-06-14 DIAGNOSIS — N186 End stage renal disease: Secondary | ICD-10-CM | POA: Diagnosis not present

## 2017-06-14 DIAGNOSIS — N2581 Secondary hyperparathyroidism of renal origin: Secondary | ICD-10-CM | POA: Diagnosis not present

## 2017-06-17 DIAGNOSIS — D509 Iron deficiency anemia, unspecified: Secondary | ICD-10-CM | POA: Diagnosis not present

## 2017-06-17 DIAGNOSIS — E1129 Type 2 diabetes mellitus with other diabetic kidney complication: Secondary | ICD-10-CM | POA: Diagnosis not present

## 2017-06-17 DIAGNOSIS — N2581 Secondary hyperparathyroidism of renal origin: Secondary | ICD-10-CM | POA: Diagnosis not present

## 2017-06-17 DIAGNOSIS — N186 End stage renal disease: Secondary | ICD-10-CM | POA: Diagnosis not present

## 2017-06-19 DIAGNOSIS — D509 Iron deficiency anemia, unspecified: Secondary | ICD-10-CM | POA: Diagnosis not present

## 2017-06-19 DIAGNOSIS — N2581 Secondary hyperparathyroidism of renal origin: Secondary | ICD-10-CM | POA: Diagnosis not present

## 2017-06-19 DIAGNOSIS — N186 End stage renal disease: Secondary | ICD-10-CM | POA: Diagnosis not present

## 2017-06-19 DIAGNOSIS — E1129 Type 2 diabetes mellitus with other diabetic kidney complication: Secondary | ICD-10-CM | POA: Diagnosis not present

## 2017-06-21 DIAGNOSIS — E1129 Type 2 diabetes mellitus with other diabetic kidney complication: Secondary | ICD-10-CM | POA: Diagnosis not present

## 2017-06-21 DIAGNOSIS — D509 Iron deficiency anemia, unspecified: Secondary | ICD-10-CM | POA: Diagnosis not present

## 2017-06-21 DIAGNOSIS — N2581 Secondary hyperparathyroidism of renal origin: Secondary | ICD-10-CM | POA: Diagnosis not present

## 2017-06-21 DIAGNOSIS — N186 End stage renal disease: Secondary | ICD-10-CM | POA: Diagnosis not present

## 2017-06-23 DIAGNOSIS — N186 End stage renal disease: Secondary | ICD-10-CM | POA: Diagnosis not present

## 2017-06-23 DIAGNOSIS — E1122 Type 2 diabetes mellitus with diabetic chronic kidney disease: Secondary | ICD-10-CM | POA: Diagnosis not present

## 2017-06-23 DIAGNOSIS — Z992 Dependence on renal dialysis: Secondary | ICD-10-CM | POA: Diagnosis not present

## 2017-06-24 DIAGNOSIS — D509 Iron deficiency anemia, unspecified: Secondary | ICD-10-CM | POA: Diagnosis not present

## 2017-06-24 DIAGNOSIS — E1129 Type 2 diabetes mellitus with other diabetic kidney complication: Secondary | ICD-10-CM | POA: Diagnosis not present

## 2017-06-24 DIAGNOSIS — D631 Anemia in chronic kidney disease: Secondary | ICD-10-CM | POA: Diagnosis not present

## 2017-06-24 DIAGNOSIS — N186 End stage renal disease: Secondary | ICD-10-CM | POA: Diagnosis not present

## 2017-06-24 DIAGNOSIS — N2581 Secondary hyperparathyroidism of renal origin: Secondary | ICD-10-CM | POA: Diagnosis not present

## 2017-06-26 DIAGNOSIS — D631 Anemia in chronic kidney disease: Secondary | ICD-10-CM | POA: Diagnosis not present

## 2017-06-26 DIAGNOSIS — D509 Iron deficiency anemia, unspecified: Secondary | ICD-10-CM | POA: Diagnosis not present

## 2017-06-26 DIAGNOSIS — E1129 Type 2 diabetes mellitus with other diabetic kidney complication: Secondary | ICD-10-CM | POA: Diagnosis not present

## 2017-06-26 DIAGNOSIS — N186 End stage renal disease: Secondary | ICD-10-CM | POA: Diagnosis not present

## 2017-06-26 DIAGNOSIS — N2581 Secondary hyperparathyroidism of renal origin: Secondary | ICD-10-CM | POA: Diagnosis not present

## 2017-06-28 DIAGNOSIS — N186 End stage renal disease: Secondary | ICD-10-CM | POA: Diagnosis not present

## 2017-06-28 DIAGNOSIS — E1129 Type 2 diabetes mellitus with other diabetic kidney complication: Secondary | ICD-10-CM | POA: Diagnosis not present

## 2017-06-28 DIAGNOSIS — D631 Anemia in chronic kidney disease: Secondary | ICD-10-CM | POA: Diagnosis not present

## 2017-06-28 DIAGNOSIS — D509 Iron deficiency anemia, unspecified: Secondary | ICD-10-CM | POA: Diagnosis not present

## 2017-06-28 DIAGNOSIS — N2581 Secondary hyperparathyroidism of renal origin: Secondary | ICD-10-CM | POA: Diagnosis not present

## 2017-07-01 DIAGNOSIS — N2581 Secondary hyperparathyroidism of renal origin: Secondary | ICD-10-CM | POA: Diagnosis not present

## 2017-07-01 DIAGNOSIS — E1129 Type 2 diabetes mellitus with other diabetic kidney complication: Secondary | ICD-10-CM | POA: Diagnosis not present

## 2017-07-01 DIAGNOSIS — D631 Anemia in chronic kidney disease: Secondary | ICD-10-CM | POA: Diagnosis not present

## 2017-07-01 DIAGNOSIS — D509 Iron deficiency anemia, unspecified: Secondary | ICD-10-CM | POA: Diagnosis not present

## 2017-07-01 DIAGNOSIS — N186 End stage renal disease: Secondary | ICD-10-CM | POA: Diagnosis not present

## 2017-07-03 DIAGNOSIS — E1129 Type 2 diabetes mellitus with other diabetic kidney complication: Secondary | ICD-10-CM | POA: Diagnosis not present

## 2017-07-03 DIAGNOSIS — N186 End stage renal disease: Secondary | ICD-10-CM | POA: Diagnosis not present

## 2017-07-03 DIAGNOSIS — N2581 Secondary hyperparathyroidism of renal origin: Secondary | ICD-10-CM | POA: Diagnosis not present

## 2017-07-03 DIAGNOSIS — D509 Iron deficiency anemia, unspecified: Secondary | ICD-10-CM | POA: Diagnosis not present

## 2017-07-03 DIAGNOSIS — D631 Anemia in chronic kidney disease: Secondary | ICD-10-CM | POA: Diagnosis not present

## 2017-07-04 ENCOUNTER — Encounter (HOSPITAL_COMMUNITY): Payer: Medicare Other

## 2017-07-04 ENCOUNTER — Ambulatory Visit: Payer: Medicare Other | Admitting: Family

## 2017-07-05 DIAGNOSIS — D631 Anemia in chronic kidney disease: Secondary | ICD-10-CM | POA: Diagnosis not present

## 2017-07-05 DIAGNOSIS — E1129 Type 2 diabetes mellitus with other diabetic kidney complication: Secondary | ICD-10-CM | POA: Diagnosis not present

## 2017-07-05 DIAGNOSIS — N2581 Secondary hyperparathyroidism of renal origin: Secondary | ICD-10-CM | POA: Diagnosis not present

## 2017-07-05 DIAGNOSIS — D509 Iron deficiency anemia, unspecified: Secondary | ICD-10-CM | POA: Diagnosis not present

## 2017-07-05 DIAGNOSIS — N186 End stage renal disease: Secondary | ICD-10-CM | POA: Diagnosis not present

## 2017-07-08 DIAGNOSIS — E1129 Type 2 diabetes mellitus with other diabetic kidney complication: Secondary | ICD-10-CM | POA: Diagnosis not present

## 2017-07-08 DIAGNOSIS — N186 End stage renal disease: Secondary | ICD-10-CM | POA: Diagnosis not present

## 2017-07-08 DIAGNOSIS — N2581 Secondary hyperparathyroidism of renal origin: Secondary | ICD-10-CM | POA: Diagnosis not present

## 2017-07-08 DIAGNOSIS — D509 Iron deficiency anemia, unspecified: Secondary | ICD-10-CM | POA: Diagnosis not present

## 2017-07-08 DIAGNOSIS — D631 Anemia in chronic kidney disease: Secondary | ICD-10-CM | POA: Diagnosis not present

## 2017-07-09 ENCOUNTER — Ambulatory Visit: Payer: Medicare Other | Admitting: Family

## 2017-07-10 DIAGNOSIS — N186 End stage renal disease: Secondary | ICD-10-CM | POA: Diagnosis not present

## 2017-07-10 DIAGNOSIS — D631 Anemia in chronic kidney disease: Secondary | ICD-10-CM | POA: Diagnosis not present

## 2017-07-10 DIAGNOSIS — D509 Iron deficiency anemia, unspecified: Secondary | ICD-10-CM | POA: Diagnosis not present

## 2017-07-10 DIAGNOSIS — N2581 Secondary hyperparathyroidism of renal origin: Secondary | ICD-10-CM | POA: Diagnosis not present

## 2017-07-10 DIAGNOSIS — E1129 Type 2 diabetes mellitus with other diabetic kidney complication: Secondary | ICD-10-CM | POA: Diagnosis not present

## 2017-07-11 DIAGNOSIS — N400 Enlarged prostate without lower urinary tract symptoms: Secondary | ICD-10-CM | POA: Diagnosis not present

## 2017-07-11 DIAGNOSIS — R310 Gross hematuria: Secondary | ICD-10-CM | POA: Diagnosis not present

## 2017-07-12 ENCOUNTER — Other Ambulatory Visit (INDEPENDENT_AMBULATORY_CARE_PROVIDER_SITE_OTHER): Payer: Medicare Other

## 2017-07-12 DIAGNOSIS — E1165 Type 2 diabetes mellitus with hyperglycemia: Secondary | ICD-10-CM

## 2017-07-12 DIAGNOSIS — D509 Iron deficiency anemia, unspecified: Secondary | ICD-10-CM | POA: Diagnosis not present

## 2017-07-12 DIAGNOSIS — D631 Anemia in chronic kidney disease: Secondary | ICD-10-CM | POA: Diagnosis not present

## 2017-07-12 DIAGNOSIS — N186 End stage renal disease: Secondary | ICD-10-CM | POA: Diagnosis not present

## 2017-07-12 DIAGNOSIS — E1129 Type 2 diabetes mellitus with other diabetic kidney complication: Secondary | ICD-10-CM | POA: Diagnosis not present

## 2017-07-12 DIAGNOSIS — Z794 Long term (current) use of insulin: Secondary | ICD-10-CM

## 2017-07-12 DIAGNOSIS — N2581 Secondary hyperparathyroidism of renal origin: Secondary | ICD-10-CM | POA: Diagnosis not present

## 2017-07-12 LAB — GLUCOSE, RANDOM: Glucose, Bld: 339 mg/dL — ABNORMAL HIGH (ref 70–99)

## 2017-07-12 LAB — TSH: TSH: 1.92 u[IU]/mL (ref 0.35–4.50)

## 2017-07-15 ENCOUNTER — Encounter: Payer: Self-pay | Admitting: Endocrinology

## 2017-07-15 ENCOUNTER — Ambulatory Visit (INDEPENDENT_AMBULATORY_CARE_PROVIDER_SITE_OTHER): Payer: Medicare Other | Admitting: Endocrinology

## 2017-07-15 ENCOUNTER — Ambulatory Visit: Payer: Medicare Other | Admitting: Endocrinology

## 2017-07-15 VITALS — BP 114/64 | HR 67 | Ht 71.0 in | Wt 200.4 lb

## 2017-07-15 DIAGNOSIS — Z794 Long term (current) use of insulin: Secondary | ICD-10-CM

## 2017-07-15 DIAGNOSIS — E1165 Type 2 diabetes mellitus with hyperglycemia: Secondary | ICD-10-CM | POA: Diagnosis not present

## 2017-07-15 DIAGNOSIS — D631 Anemia in chronic kidney disease: Secondary | ICD-10-CM | POA: Diagnosis not present

## 2017-07-15 DIAGNOSIS — E1129 Type 2 diabetes mellitus with other diabetic kidney complication: Secondary | ICD-10-CM | POA: Diagnosis not present

## 2017-07-15 DIAGNOSIS — N186 End stage renal disease: Secondary | ICD-10-CM | POA: Diagnosis not present

## 2017-07-15 DIAGNOSIS — N2581 Secondary hyperparathyroidism of renal origin: Secondary | ICD-10-CM | POA: Diagnosis not present

## 2017-07-15 DIAGNOSIS — D509 Iron deficiency anemia, unspecified: Secondary | ICD-10-CM | POA: Diagnosis not present

## 2017-07-15 NOTE — Patient Instructions (Signed)
Stop snacking at dialysis  Must take 5-10 Units Novolog at lunch based on meal size

## 2017-07-15 NOTE — Progress Notes (Signed)
Patient ID: [name], male   DOB: 06/25/1941, 76 y.o.   MRN: [id number]           Reason for Appointment:  Follow-up for Type 2 Diabetes  Referring physician: [place]  History of Present Illness:          Date of diagnosis of type 2 diabetes mellitus:  1996      Background history:  He has had long-standing diabetes probably treated with metformin initially and subsequently with sulfonylurea drugs At some point he was also given Actos in addition to his glipizide which he is still taking Records of his control are only available for the last 4 years Over the last few years his A1c has been generally in the 6-7% range His A1c was 6.2 in May 2016 and apparently was starting to get low normal blood sugars at times He was admitted to the hospital for fluid overload and renal failure and at that time his Actos was stopped Subsequently his blood sugars had been markedly increased He was switched from regular insulin to NovoLog on his initial consultation  Recent history:    His fructosamine is higher than usual at 454, previously A1c was 8.6   INSULIN regimen is described as: Lantus 10 units in am; 10--5--10  units Novolog with meals with syringe  Dialysis is done at 7 am now, doing it on Monday/Wednesday/Fridays     Current blood sugar patterns and problems identified:  He did  bring his monitor for download but it does not work and has only a couple of readings due to lack of battery  His blood sugar in the lab was 3:30 9 in the afternoon  He now says that he is constantly snacking at dialysis and also eating cookies  His blood sugars do not get low even with taking 10 units of NovoLog in the morning when he is mostly eating a banana and having a protein shake  At lunchtime he will have a half sandwich but does not take any Novolog at that time usually  Also usually does not check sugars after his evening meal  Not following any but did not diet and his weight has  recently gone back up a little  Although his fasting readings are not consistently he is not sure if he is taking 10 units at 13 of the Lantus  Oral hypoglycemic drugs the patient is taking are:  on Actos 30 mg daily in a.m.     Side effects from medications have been: None  Compliance with the medical regimen: Good   Glucose monitoring:  done 0-2  times a day         Glucometer:  Freestyle  Blood Glucose readings by recall:  Am 120-151. 190   Self-care: The diet that the patient has been following is: tries to limit sweets, portions. eating out only about once a week at a steak house        Typical meal intake: Breakfast is  Protein shake/banana; supper 8 pm            Dietician visit, most recent: ?               Exercise: some walking At times  Weight history:  Wt Readings from Last 3 Encounters:  07/15/17 200 lb 6.4 oz (90.9 kg)  05/14/17 198 lb (89.8 kg)  03/13/17 203 lb 6.4 oz (92.3 kg)    Glycemic control:    Lab Results  Component Value Date  HGBA1C 8.6 (H) 05/09/2017   HGBA1C 7.4 (H) 03/08/2017   HGBA1C 7.8 (H) 12/06/2016   Lab Results  Component Value Date   MICROALBUR 37.2 09/03/2016   LDLCALC 31 09/03/2016   CREATININE 5.58 (HH) 05/09/2017    Lab Results  Component Value Date   FRUCTOSAMINE 454 (H) 07/12/2017   FRUCTOSAMINE 355 (H) 10/08/2016   FRUCTOSAMINE 407 (H) 08/29/2016      Allergies as of 07/15/2017      Reactions   Penicillins Rash   Has patient had a PCN reaction causing immediate rash, facial/tongue/throat swelling, SOB or lightheadedness with hypotension: Yes Has patient had a PCN reaction causing severe rash involving mucus membranes or skin necrosis: Yes Has patient had a PCN reaction that required hospitalization: No Has patient had a PCN reaction occurring within the last 10 years: No If all of the above answers are "NO", then may proceed with Cephalosporin use.      Medication List       Accurate as of 07/15/17 11:59  PM. Always use your most recent med list.          acetaminophen 500 MG tablet Commonly known as:  TYLENOL Take 500 mg by mouth every 6 (six) hours as needed (for pain).   acetaminophen-codeine 300-30 MG tablet Commonly known as:  TYLENOL #3 take 1 tablet by mouth every 4 hours if needed for pain   allopurinol 100 MG tablet Commonly known as:  ZYLOPRIM Take 100 mg by mouth daily.   aspirin 325 MG tablet Take 650 mg by mouth daily.   azithromycin 250 MG tablet Commonly known as:  ZITHROMAX Z-PAK One po daily x 5 days   clobetasol 0.05 % external solution Commonly known as:  TEMOVATE apply to affected area ON THE SCALP UP TO 2 TIMES A DAY AS NEEDED...  (REFER TO PRESCRIPTION NOTES).   donepezil 10 MG tablet Commonly known as:  ARICEPT Take 1 tablet (10 mg total) by mouth at bedtime.   Fluocinolone Acetonide Scalp 0.01 % Oil apply to affected area ON SCALP EVERY NIGHT AT BEDTIME, WASH UPON AWAKENING   fluticasone 0.05 % cream Commonly known as:  CUTIVATE apply to affected area ON FACE UP TO 2 TIMES A DAY AS NEEDED   fluticasone 50 MCG/ACT nasal spray Commonly known as:  FLONASE Place 1 spray into both nostrils daily.   FREESTYLE FREEDOM LITE w/Device Kit Use to check blood sugar 2 times per day dx code E11.65   freestyle lancets Use as instructed to check blood sugar 2 times per day dx code E11.65   glucose blood test strip Commonly known as:  ONETOUCH VERIO Use to check blood sugar 2 times per day. Dx code E11.65   insulin aspart 100 UNIT/ML injection Commonly known as:  novoLOG Inject 10 Units into the skin 3 (three) times daily before meals.   insulin glargine 100 UNIT/ML injection Commonly known as:  LANTUS Inject 0.08 mLs (8 Units total) into the skin every morning.   Insulin Pen Needle 30G X 8 MM Misc Commonly known as:  NOVOFINE Use to inject insulin 3 times daily   ketoconazole 2 % shampoo Commonly known as:  NIZORAL Apply 1 application  topically 2 (two) times a week.   levothyroxine 50 MCG tablet Commonly known as:  SYNTHROID, LEVOTHROID Take 1 tablet (50 mcg total) by mouth daily.   lidocaine-prilocaine cream Commonly known as:  EMLA Apply 1 application topically once. AS DIRECTED   loratadine 10 MG tablet Commonly  known as:  CLARITIN Take 10 mg by mouth daily as needed for allergies.   metroNIDAZOLE 0.75 % gel Commonly known as:  METROGEL APPLY TO AFFECTED AREA ON SKIN  ON FACE TWICE DAILY   mupirocin ointment 2 % Commonly known as:  BACTROBAN Use as directed to affected areas   Naftifine HCl 2 % Crea Commonly known as:  NAFTIN Apply to feet daily for fungal infection   pioglitazone 30 MG tablet Commonly known as:  ACTOS take 1 tablet by mouth once daily   RENVELA 800 MG tablet Generic drug:  sevelamer carbonate Take 800-2,400 mg by mouth See admin instructions. 1,600-2,400 mg with breakfast then 1,600 mg with lunch then 2,400 mg with dinner (evening meal) and 800 mg with each snack   SENSIPAR 30 MG tablet Generic drug:  cinacalcet Take 30 mg by mouth daily.   simvastatin 20 MG tablet Commonly known as:  ZOCOR Take 20 mg by mouth at bedtime.   triamcinolone ointment 0.1 % Commonly known as:  KENALOG Apply 1 application topically 2 (two) times daily. Use between thighs       Allergies:  Allergies  Allergen Reactions  . Penicillins Rash    Has patient had a PCN reaction causing immediate rash, facial/tongue/throat swelling, SOB or lightheadedness with hypotension: Yes Has patient had a PCN reaction causing severe rash involving mucus membranes or skin necrosis: Yes Has patient had a PCN reaction that required hospitalization: No Has patient had a PCN reaction occurring within the last 10 years: No If all of the above answers are "NO", then may proceed with Cephalosporin use.     Past Medical History:  Diagnosis Date  . Allergy   . Anemia   . Arthritis   . Coronary artery disease     . Diabetes mellitus    Type 2  . Diverticulitis   . ED (erectile dysfunction)   . 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 ESRD (end stage renal disease) on dialysis ([place]) 03/2015  . GERD (gastroesophageal reflux disease)    pepto   . Gout   . Hyperlipidemia   . Hypertension   . Hypothyroidism   . Pneumonia   . PVD (peripheral vascular disease) ([place])    has plastic aorta  . Renal insufficiency   . Seasonal allergies   . Shortness of breath dyspnea   . Sleep apnea   . Thyroid disease     Past Surgical History:  Procedure Laterality Date  . aortobifemoral bypass    . AV FISTULA PLACEMENT Left 12/01/2013   Procedure: ARTERIOVENOUS (AV) FISTULA CREATION- LEFT BRACHIOCEPHALIC;  Surgeon: Angelia Mould, MD;  Location: [facility];  Service: Vascular;  Laterality: Left;  . [place] TRANSPOSITION Right 07/27/2014   Procedure: BASCILIC VEIN TRANSPOSITION;  Surgeon: Angelia Mould, MD;  Location: [place] [facility];  Service: Vascular;  Laterality: Right;  . BREAST SURGERY     left - granulomatous mastitis  . COLONOSCOPY    . 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 EYE SURGERY Bilateral    cataracts  . REVISON OF ARTERIOVENOUS FISTULA Left 02/09/2014   Procedure: REVISON OF LEFT ARTERIOVENOUS FISTULA - RESECTION OF RENDUNDANT VEIN;  Surgeon: Angelia Mould, MD;  Location: [facility];  Service: Vascular;  Laterality: Left;  . SBO with lysis adhesions    . SHUNTOGRAM Left 04/19/2014   Procedure: FISTULOGRAM;  Surgeon: Angelia Mould, MD;  Location: [facility] CATH LAB;  Service: Cardiovascular;  Laterality: Left;  .  UNILATERAL UPPER EXTREMEITY ANGIOGRAM N/A 07/12/2014   Procedure: UNILATERAL UPPER Anselmo Rod;  Surgeon: Angelia Mould, MD;  Location: [facility] CATH LAB;  Service: Cardiovascular;  Laterality: N/A;    Family History  Problem Relation Age of Onset  . Aneurysm Mother   . Heart disease Father   . Stroke Father   . Hypertension Father   . Diabetes Father    . Dementia Neg Hx     Social History:  reports that he quit smoking about 22 years ago. He has never used smokeless tobacco. He reports that he does not drink alcohol or use drugs.    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 Fructosamine 07/12/2017 454* 0 - 285 umol/L Final   Comment: Published reference interval for apparently healthy subjects between age 44 and 55 is 51 - 285 umol/L and in a poorly controlled diabetic population is 228 - 563 umol/L with a mean of 396 umol/L.   Marland Kitchen Glucose, Bld 07/12/2017 339* 70 - 99 mg/dL Final  . TSH 07/12/2017 1.92  0.35 - 4.50 uIU/mL Final    Physical Examination:  BP 114/64   Pulse 67   Ht _0  (1.803 m)   Wt 200 lb 6.4 oz (90.9 kg)   SpO2 98%   BMI 27.95 kg/m       ASSESSMENT:  Diabetes type 2, uncontrolled See history of present illness for detailed discussion of his current management, blood sugar patterns and problems identified  His Last A1c is 8.6, previously 7.4.  Fructosamine is high at  454 which is higher than usual indicating poor control  Again difficult to know what his blood sugar patterns are since he has checked infrequently and his monitor is not working today Discussed with the patient that he had a blood sugar of 339 after coming back from dialysis where he is eating a lot of snacks and cookies Also not clear if his fasting readings are consistently normal with current dose of Lantus, he is not sure of this dose also He  also may be having high postprandial readings after evening meal which he does not check   PLAN:  He needs to start checking his blood sugars consistently and at least twice a day at various times He needs to see the dietitian for meal planning He will try to cut out all the snacks except small amount of higher protein snack while at dialysis which is making his blood sugars much higher during the day Try to take coverage for his lunch or appropriately, he is needing to take as much as 10 units for full sandwich He will adjust his Lantus only if his fasting blood sugars are consistently abnormal  Patient Instructions  Stop snacking at dialysis  Must take 5-10 Units Novolog at lunch based on meal size         Jannis Atkins  07/16/2017, 9:39 AM   Note: This office note was prepared with Estate agent. Any transcriptional errors that result from this process are unintentional.

## 2017-07-16 ENCOUNTER — Ambulatory Visit (INDEPENDENT_AMBULATORY_CARE_PROVIDER_SITE_OTHER): Payer: Medicare Other | Admitting: Family

## 2017-07-16 ENCOUNTER — Encounter: Payer: Self-pay | Admitting: Family

## 2017-07-16 ENCOUNTER — Ambulatory Visit (HOSPITAL_COMMUNITY)
Admission: RE | Admit: 2017-07-16 | Discharge: 2017-07-16 | Disposition: A | Discharge status: Home / Self Care | Payer: Medicare Other | Source: Ambulatory Visit | Attending: Vascular Surgery | Admitting: Vascular Surgery

## 2017-07-16 VITALS — BP 142/65 | HR 65 | Temp 97.0°F | Resp 18 | Ht 71.0 in | Wt 200.0 lb

## 2017-07-16 DIAGNOSIS — N186 End stage renal disease: Secondary | ICD-10-CM | POA: Diagnosis not present

## 2017-07-16 DIAGNOSIS — Z992 Dependence on renal dialysis: Secondary | ICD-10-CM | POA: Diagnosis not present

## 2017-07-16 DIAGNOSIS — I739 Peripheral vascular disease, unspecified: Secondary | ICD-10-CM | POA: Insufficient documentation

## 2017-07-16 DIAGNOSIS — I770 Arteriovenous fistula, acquired: Secondary | ICD-10-CM

## 2017-07-16 DIAGNOSIS — I779 Disorder of arteries and arterioles, unspecified: Secondary | ICD-10-CM

## 2017-07-16 LAB — FRUCTOSAMINE: Fructosamine: 454 umol/L — ABNORMAL HIGH (ref 0–285)

## 2017-07-16 NOTE — Progress Notes (Signed)
[facility] [place]   CC: Follow up peripheral artery occlusive disease  History of Present Illness [name] is a 76 y.o. male who was seen in the hospital in 2017 as a consult with left hand pain and bilateral lower extremity weakness. The patient  previously had a left upper arm AV fistula and there was some concern for steal but he has a chronic left subclavian vein occlusions of this was thought to be less likely. He was seen by his primary care physician and thought this may have been an exacerbation of possible carpal tunnel syndrome. He states his left hand is better.  As far as his lower extremity weakness is concerned. He occasionally has some right hip and back pain. He states otherwise his walking is improved. He did have a previous aortobifemoral bypass by Dr. Amedeo Plenty many years ago. His pain symptoms he has no ulcerations on his feet. Other medical problems include coronary artery disease, diabetes, end-stage renal disease all of which are currently stable.  Dr. Oneida Alar last evaluated pt on 06-28-16. 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 Bilateral peripheral arterial disease mildly asymptomatic at most as the patient is not very ambulatory. At that time he was not at risk of limb loss. Any operation would be high risk due to redo operation because of the patient's overall debilitated strate and previous aortobifemoral bypass. He will follow-up in one year with repeat ABIs. As long as his symptoms do not progress we will follow him and manage him medically.  He dialyzes M-W-F via right arm AVF, states no problems with this.   He denies claudication sx's in his legs with walking, denies non healing wounds in his feet or legs. He has an abdominal wound that is followed by Dr. Con Memos in [place] at the wound care center.   He admits to  walking less than he had been.    Pt Diabetic: Yes, 8.6 A1C on 05-09-17 Pt smoker: former smoker, quit in 1996  Pt meds include: Statin :Yes Betablocker: No ASA: Yes Other anticoagulants/antiplatelets: no  Past Medical History:  Diagnosis Date  . Allergy   . Anemia   . Arthritis   . Coronary artery disease   . Diabetes mellitus    Type 2  . Diverticulitis   . ED (erectile dysfunction)   . 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 ESRD (end stage renal disease) on dialysis ([place]) 03/2015  . GERD (gastroesophageal reflux disease)    pepto   . Gout   . Hyperlipidemia   . Hypertension   . Hypothyroidism   . Pneumonia   . PVD (peripheral vascular disease) ([place])    has plastic aorta  . Renal insufficiency   . Seasonal allergies   . Shortness of breath dyspnea   . Sleep apnea   . Thyroid disease     Social History Social History  Substance Use Topics  . Smoking status: Former Smoker    Quit date: 09/24/1994  . Smokeless tobacco: Never Used  . Alcohol use No     Comment: Quit Oct. 1977 ("somewhat heavy")    Family History Family History  Problem Relation Age of Onset  . Aneurysm Mother   . Heart disease Father   . Stroke Father   . Hypertension Father   . Diabetes Father   . Dementia Neg Hx  Past Surgical History:  Procedure Laterality Date  . aortobifemoral bypass    . AV FISTULA PLACEMENT Left 12/01/2013   Procedure: ARTERIOVENOUS (AV) FISTULA CREATION- LEFT BRACHIOCEPHALIC;  Surgeon: Angelia Mould, MD;  Location: [facility];  Service: Vascular;  Laterality: Left;  . [place] TRANSPOSITION Right 07/27/2014   Procedure: BASCILIC VEIN TRANSPOSITION;  Surgeon: Angelia Mould, MD;  Location: [facility];  Service: Vascular;  Laterality: Right;  . BREAST SURGERY     left - granulomatous mastitis  . COLONOSCOPY    . 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 EYE SURGERY Bilateral    cataracts  . REVISON OF ARTERIOVENOUS FISTULA Left 02/09/2014    Procedure: REVISON OF LEFT ARTERIOVENOUS FISTULA - RESECTION OF RENDUNDANT VEIN;  Surgeon: Angelia Mould, MD;  Location: [facility];  Service: Vascular;  Laterality: Left;  . SBO with lysis adhesions    . SHUNTOGRAM Left 04/19/2014   Procedure: FISTULOGRAM;  Surgeon: Angelia Mould, MD;  Location: [facility] CATH LAB;  Service: Cardiovascular;  Laterality: Left;  . UNILATERAL UPPER EXTREMEITY ANGIOGRAM N/A 07/12/2014   Procedure: UNILATERAL UPPER Anselmo Rod;  Surgeon: Angelia Mould, MD;  Location[facility] C[facility] LAB;  Service: Cardiovascular;  Laterality: N/A;    Allergies  Allergen Reactions  . Penicillins Rash    Has patient had a PCN reaction causing immediate rash, facial/tongue/throat swelling, SOB or lightheadedness with hypotension: Yes Has patient had a PCN reaction causing severe rash involving mucus membranes or skin necrosis: Yes Has patient had a PCN reaction that required hospitalization: No Has patient had a PCN reaction occurring within the last 10 years: No If all of the above answers are "NO", then may proceed with Cephalosporin use.     Current Outpatient Prescriptions  Medication Sig Dispense Refill  . acetaminophen (TYLENOL) 500 MG tablet Take 500 mg by mouth every 6 (six) hours as needed (for pain).     Marland Kitchen acetaminophen-codeine (TYLENOL #3) 300-30 MG tablet take 1 tablet by mouth every 4 hours if needed for pain  0  . allopurinol (ZYLOPRIM) 100 MG tablet Take 100 mg by mouth daily.      Marland Kitchen aspirin 325 MG tablet Take 650 mg by mouth daily.     Marland Kitchen azithromycin (ZITHROMAX Z-PAK) 250 MG tablet One po daily x 5 days 5 tablet 0  . Blood Glucose Monitoring Suppl (FREESTYLE FREEDOM LITE) w/Device KIT Use to check blood sugar 2 times per day dx code E11.65 1 each 0  . clobetasol (TEMOVATE) 0.05 % external solution apply to affected area ON THE SCALP UP TO 2 TIMES A DAY AS NEEDED...  (REFER TO PRESCRIPTION NOTES).  0  . donepezil (ARICEPT) 10 MG tablet Take 1 tablet  (10 mg total) by mouth at bedtime. 30 tablet 12  . FLUOCINOLONE ACETONIDE SCALP 0.01 % OIL apply to affected area ON SCALP EVERY NIGHT AT BEDTIME, WASH UPON AWAKENING  0  . fluticasone (CUTIVATE) 0.05 % cream apply to affected area ON FACE UP TO 2 TIMES A DAY AS NEEDED  0  . fluticasone (FLONASE) 50 MCG/ACT nasal spray Place 1 spray into both nostrils daily. (Patient taking differently: Place 1 spray into both nostrils daily as needed for allergies. ) 16 g 2  . glucose blood (ONETOUCH VERIO) test strip Use to check blood sugar 2 times per day. Dx code E11.65 100 each 2  . insulin aspart (NOVOLOG) 100 UNIT/ML injection Inject 10 Units into the skin  3 (three) times daily before meals. (Patient taking differently: Inject 10 Units into the skin See admin instructions. Two to three times a day before meals) 20 mL 2  . insulin glargine (LANTUS) 100 UNIT/ML injection Inject 0.08 mLs (8 Units total) into the skin every morning. (Patient taking differently: Inject 13 Units into the skin every morning. )    . Insulin Pen Needle (NOVOFINE) 30G X 8 MM MISC Use to inject insulin 3 times daily 90 each 4  . ketoconazole (NIZORAL) 2 % shampoo Apply 1 application topically 2 (two) times a week. 120 mL 0  . Lancets (FREESTYLE) lancets Use as instructed to check blood sugar 2 times per day dx code E11.65 100 each 3  . levothyroxine (SYNTHROID, LEVOTHROID) 50 MCG tablet Take 1 tablet (50 mcg total) by mouth daily. 90 tablet 1  . lidocaine-prilocaine (EMLA) cream Apply 1 application topically once. AS DIRECTED  1  . loratadine (CLARITIN) 10 MG tablet Take 10 mg by mouth daily as needed for allergies.     . metroNIDAZOLE (METROGEL) 0.75 % gel APPLY TO AFFECTED AREA ON SKIN  ON FACE TWICE DAILY  0  . mupirocin ointment (BACTROBAN) 2 % Use as directed to affected areas 22 g prn  . Naftifine HCl (NAFTIN) 2 % CREA Apply to feet daily for fungal infection 60 g prn  . pioglitazone (ACTOS) 30 MG tablet take 1 tablet by mouth  once daily 30 tablet 2  . RENVELA 800 MG tablet Take 800-2,400 mg by mouth See admin instructions. 1,600-2,400 mg with breakfast then 1,600 mg with lunch then 2,400 mg with dinner (evening meal) and 800 mg with each snack  0  . SENSIPAR 30 MG tablet Take 30 mg by mouth daily.   1  . simvastatin (ZOCOR) 20 MG tablet Take 20 mg by mouth at bedtime.     . triamcinolone ointment (KENALOG) 0.1 % Apply 1 application topically 2 (two) times daily. Use between thighs     No current facility-administered medications for this visit.     ROS: See HPI for pertinent positives and negatives.   Physical Examination  Vitals:   07/16/17 1253  BP: (!) 142/65  Pulse: 65  Resp: 18  Temp: (!) 97 F (36.1 C)  TempSrc: Oral  SpO2: 98%  Weight: 200 lb (90.7 kg)  Height: _0  (1.803 m)   Body mass index is 27.89 kg/m.  General: A&O x 3, WDWN, male. Gait: normal Eyes: PERRLA. Pulmonary: Respirations are non labored, CTAB, good air movement Cardiac: regular Rhythm, no detected murmur.         Carotid Bruits Right Left   Negative Negative   Radial pulses are 2+ palpable bilaterally   Adominal aortic pulse is not palpable                         VASCULAR EXAM: Extremities without ischemic changes, without Gangrene; without open wounds. Mild hemosiderin staining in both lower legs, no edema. A few varicosities of varying sizes in both legs.  LE Pulses Right Left       FEMORAL  2+ palpable  2+ palpable        POPLITEAL  not palpable   not palpable       POSTERIOR TIBIAL  not palpable   2+ palpable        DORSALIS PEDIS      ANTERIOR TIBIAL not palpable  not palpable    Abdomen: soft, NT, no palpable masses. Open wound proximal to umbilicus, dressing partially intact, no drainage noted, no erythema.  Skin: no rashes, no ulcers noted. Musculoskeletal: no muscle wasting or  atrophy.  Neurologic: A&O X 3; appropriate affect ; SENSATION: normal; MOTOR FUNCTION:  moving all extremities equally, motor strength 5/5 throughout. Speech is fluent/normal. CN 2-12 intact.    ASSESSMENT: [name] is a 76 y.o. male who has a remote history of aortobifemoral bypass. He also is ESRD on hemodialysis via right arm AV fistula, states he has no problems with this. He has no steal sx's  He does not seem to walk enough to elicit claudication sx's. There are no signs of ischemia in his feet or legs.   His atherosclerotic risk factors include uncontrolled DM, former smoker, and ESRD on hemodialysis. The wound care center in [place] addresses the recurring opening of an abdominal wound that he states is likely from his belt irritating his abdomen. We discussed him wearing loose pants and holding them up with suspenders.   DATA  ABI (Date: 07/16/2017):  R:   ABI: 0.58 (was 0.53 on 06-26-16),   PT: mono  DP: bi  TBI:  0.61 (was 0.69)  L:   ABI: 0.65 (was 0.87),   PT: tri  DP: bi  TBI: 0.65 (was 0.70) Stable in the right with moderate arterial occlusive disease, decline in the left with moderate disease.    PLAN:  Graduated walking program discussed and how to achieve.   Based on the patient's vascular studies and examination, pt will return to clinic in 1 year with ABI's. He knows to notify us if he develops concerns re the circulation in his feet or legs.   I discussed in depth with the patient the nature of atherosclerosis, and emphasized the importance of maximal medical management including strict control of blood pressure, blood glucose, and lipid levels, obtaining regular exercise, and continued cessation of smoking.  The patient is aware that without maximal medical management the underlying atherosclerotic disease process will progress, limiting the benefit of any interventions.  The patient was given information about PAD including signs, symptoms,  treatment, what symptoms should prompt the patient to seek immediate medical care, and risk reduction measures to take.  Clemon Chambers, RN, MSN, FNP-C [facility] Phone: [phone]  Clinic MD: Early  07/16/17 1:11 PM

## 2017-07-16 NOTE — Patient Instructions (Signed)

## 2017-07-17 DIAGNOSIS — E1129 Type 2 diabetes mellitus with other diabetic kidney complication: Secondary | ICD-10-CM | POA: Diagnosis not present

## 2017-07-17 DIAGNOSIS — N186 End stage renal disease: Secondary | ICD-10-CM | POA: Diagnosis not present

## 2017-07-17 DIAGNOSIS — D631 Anemia in chronic kidney disease: Secondary | ICD-10-CM | POA: Diagnosis not present

## 2017-07-17 DIAGNOSIS — D509 Iron deficiency anemia, unspecified: Secondary | ICD-10-CM | POA: Diagnosis not present

## 2017-07-17 DIAGNOSIS — N2581 Secondary hyperparathyroidism of renal origin: Secondary | ICD-10-CM | POA: Diagnosis not present

## 2017-07-19 DIAGNOSIS — N2581 Secondary hyperparathyroidism of renal origin: Secondary | ICD-10-CM | POA: Diagnosis not present

## 2017-07-19 DIAGNOSIS — D631 Anemia in chronic kidney disease: Secondary | ICD-10-CM | POA: Diagnosis not present

## 2017-07-19 DIAGNOSIS — N186 End stage renal disease: Secondary | ICD-10-CM | POA: Diagnosis not present

## 2017-07-19 DIAGNOSIS — E1129 Type 2 diabetes mellitus with other diabetic kidney complication: Secondary | ICD-10-CM | POA: Diagnosis not present

## 2017-07-19 DIAGNOSIS — D509 Iron deficiency anemia, unspecified: Secondary | ICD-10-CM | POA: Diagnosis not present

## 2017-07-22 DIAGNOSIS — D631 Anemia in chronic kidney disease: Secondary | ICD-10-CM | POA: Diagnosis not present

## 2017-07-22 DIAGNOSIS — N2581 Secondary hyperparathyroidism of renal origin: Secondary | ICD-10-CM | POA: Diagnosis not present

## 2017-07-22 DIAGNOSIS — D509 Iron deficiency anemia, unspecified: Secondary | ICD-10-CM | POA: Diagnosis not present

## 2017-07-22 DIAGNOSIS — E1129 Type 2 diabetes mellitus with other diabetic kidney complication: Secondary | ICD-10-CM | POA: Diagnosis not present

## 2017-07-22 DIAGNOSIS — N186 End stage renal disease: Secondary | ICD-10-CM | POA: Diagnosis not present

## 2017-07-24 DIAGNOSIS — E1129 Type 2 diabetes mellitus with other diabetic kidney complication: Secondary | ICD-10-CM | POA: Diagnosis not present

## 2017-07-24 DIAGNOSIS — Z992 Dependence on renal dialysis: Secondary | ICD-10-CM | POA: Diagnosis not present

## 2017-07-24 DIAGNOSIS — N186 End stage renal disease: Secondary | ICD-10-CM | POA: Diagnosis not present

## 2017-07-24 DIAGNOSIS — E1122 Type 2 diabetes mellitus with diabetic chronic kidney disease: Secondary | ICD-10-CM | POA: Diagnosis not present

## 2017-07-24 DIAGNOSIS — N2581 Secondary hyperparathyroidism of renal origin: Secondary | ICD-10-CM | POA: Diagnosis not present

## 2017-07-24 DIAGNOSIS — D509 Iron deficiency anemia, unspecified: Secondary | ICD-10-CM | POA: Diagnosis not present

## 2017-07-24 DIAGNOSIS — D631 Anemia in chronic kidney disease: Secondary | ICD-10-CM | POA: Diagnosis not present

## 2017-07-26 DIAGNOSIS — N186 End stage renal disease: Secondary | ICD-10-CM | POA: Diagnosis not present

## 2017-07-26 DIAGNOSIS — N2581 Secondary hyperparathyroidism of renal origin: Secondary | ICD-10-CM | POA: Diagnosis not present

## 2017-07-26 DIAGNOSIS — E1129 Type 2 diabetes mellitus with other diabetic kidney complication: Secondary | ICD-10-CM | POA: Diagnosis not present

## 2017-07-29 DIAGNOSIS — E1129 Type 2 diabetes mellitus with other diabetic kidney complication: Secondary | ICD-10-CM | POA: Diagnosis not present

## 2017-07-29 DIAGNOSIS — N2581 Secondary hyperparathyroidism of renal origin: Secondary | ICD-10-CM | POA: Diagnosis not present

## 2017-07-29 DIAGNOSIS — N186 End stage renal disease: Secondary | ICD-10-CM | POA: Diagnosis not present

## 2017-07-30 ENCOUNTER — Ambulatory Visit (INDEPENDENT_AMBULATORY_CARE_PROVIDER_SITE_OTHER): Payer: Medicare Other | Admitting: Neurology

## 2017-07-30 ENCOUNTER — Encounter: Payer: Self-pay | Admitting: Neurology

## 2017-07-30 VITALS — BP 145/56 | HR 61 | Ht 71.0 in | Wt 202.6 lb

## 2017-07-30 DIAGNOSIS — G3184 Mild cognitive impairment, so stated: Secondary | ICD-10-CM | POA: Diagnosis not present

## 2017-07-30 DIAGNOSIS — I779 Disorder of arteries and arterioles, unspecified: Secondary | ICD-10-CM

## 2017-07-30 NOTE — Progress Notes (Signed)
[facility]    Provider:  Dr Jaynee Eagles Referring Provider: Elby Showers, MD Primary Care Physician:  Elby Showers, MD  REASON FOR VISIT: Follow-up for mild cognitive impairment HISTORY FROM: Patient   07/30/2017: Patient is stable, he sleeps ok, he was having a lot of stress in the past when he saw [place] with medical issues. He is going through dialysis, heels his memory issues are stable. Discussed his memory, MMSE 28/30,  exam is stable today. If memory loss is progressive refer him to neurocognitive testing at this point we'll monitor clinically.  UPDATE 10/18/2017CM Mr. Pizano, 76 year old male returns for follow-up. He has history of mild cognitive. MOCA is stable. He is currently on Aricept denies side effects to the medication. He gets his medications through the [place]. He goes to dialysis 3 times a week. He continues to be fairly active. He is planning a vacation to [place] next month. He feels his memory is stable He continues to drive without difficulty. He says he has not gotten lost. He returns for reevaluation    HISTORY OF PRESENT ILLNESS:UPDATE 5/ 3/ 2018CM Mr. Faivre, 76 year old male returns for follow-up with history of mild cognitive impairment. He is currently on Aricept 10 mg daily through the [place] without side effects. He continues to go to dialysis 3 times a week. He continues to be active. He is going to [place] 2 weeks to see his granddaughter get married. He has had no issues with driving. His  MOCA score is stable. He returns for reevaluation  UPDATE 10/18/2017CM Mr. Hammerschmidt, 76 year old male returns for follow-up. He has history of mild cognitive. MOCA is stable. He is currently on Aricept denies side effects to the medication. He gets his medications through the [place]. He goes to dialysis 3 times a week. He continues to be fairly active. He is planning a vacation to [place] next month. He feels his memory is stable He continues to drive without  difficulty. He says he has not gotten lost. He returns for reevaluation   Interval history: 01/12/16 AAPatient no-showed for multiple (3) appointments and was discharged from our practice. I spoke to him and agreed to see him again wit the understanding that if he misses another appointment he will need to find another neurology group. He has been going through a lot recently, trying to get disability and his health has declined. He says he has too many appointments. He goes to Dr. Renold Genta and also goes to the [place]. He can follow with Dr. Renold Genta, doesn't need to follow with [place] unless he needs [place] if he feels he has too many doctors. He takes 630m of asa a day, not sure why, advised him this is not indicated for stroke prevention. 890mdaily. 65044maily may increase his risk of bleeding. He is on Aricept 60m47mily and we will refill this today. Last hgba1c 7.2. LDL 33. TSH wnl. B12 in July 2016 was 1068 (Hx of b12 deficiency). Memory is stable, in fact he is feeling a little better.   HPI: WillRONDELL PARDONa 73 y37. male here as a referral from Dr. BaxlRenold Genta memory problems. PMHx peripheral vascular disease, hypertension, hypothyroidism, hyperparathyroidism, diabetes(uncontrolled since starting dialysis), end-stage renal disease on dialysis, hyperlipidemia, b-12 deficiency on injections (B12 237 at that time). Recently he has noticed he can't catch onto things as quickly as he used to. The latter part of may her noticed it, when he was having more medical issues and he  was gaining fluid and had a bout of pneumonia. His memory got really bad before he went into the hospital and was started on dialysis. Things are better since then. He is more alert and catching things better. Wife is here and provides much information, she says he can't keep up with his cell phone and keys and he loses them. He goes into the kitchen to make a snack and he forgets to close the cabinets. He is forgetting people's names,  appointments. He used to always be able to monitor medications and he doesn't remember what he is taking his meds for now. He used to be very sharp with this meds but there is a big change. He misplaces checks. He does pay the bills. No accidents with the car or at home. No delusions or hallucinations. No changes in personality. He is more anxious because of the medical problems. Wife endorses depression. He still gets out and likes to do things, but the side effects of his medical conditions are hard for him to handle. He is frustrated. No loss of consciousness, no episodes of confusion, no staring spells. No other focal neurologic deficits. He worries a lot. Endorses snoring, excessive daytime fatigue and witnessed apneic events.       Review of Systems: Patient complains of symptoms per HPI as well as the following symptoms: memory loss. Pertinent negatives and positives per HPI. All others negative.   Social History   Socioeconomic History  . Marital status: Married    Spouse name: [name] Mood  . Number of children: 1  . Years of education: 56  . Highest education level: Not on file  Social Needs  . Financial resource strain: Not on file  . Food insecurity - worry: Not on file  . Food insecurity - inability: Not on file  . Transportation needs - medical: No  . Transportation needs - non-medical: No  Occupational History  . Occupation: Retired  Tobacco Use  . Smoking status: Former Smoker    Last attempt to quit: 09/24/1994    Years since quitting: 22.8  . Smokeless tobacco: Never Used  Substance and Sexual Activity  . Alcohol use: No    Alcohol/week: 0.0 oz    Comment: Quit Oct. 1977 ("somewhat heavy")  . Drug use: No  . Sexual activity: Not on file  Other Topics Concern  . Not on file  Social History Narrative   Lives at home with wife.   Caffeine use: Drinks no soda or tea.    Drinks 1 cup coffee/week   Right handed    Family History  Problem Relation Age of Onset    . Aneurysm Mother   . Heart disease Father   . Stroke Father   . Hypertension Father   . Diabetes Father   . Dementia Neg Hx     Past Medical History:  Diagnosis Date  . Allergy   . Anemia   . Arthritis   . Coronary artery disease   . Diabetes mellitus    Type 2  . Diverticulitis   . ED (erectile dysfunction)   . 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 ESRD (end stage renal disease) on dialysis ([place]) 03/2015  . GERD (gastroesophageal reflux disease)    pepto   . Gout   . Hyperlipidemia   . Hypertension   . Hypothyroidism   . Pneumonia   . PVD (peripheral vascular disease) ([place])    has plastic aorta  . Renal insufficiency   . Seasonal allergies   .  Shortness of breath dyspnea   . Sleep apnea   . Thyroid disease     Past Surgical History:  Procedure Laterality Date  . aortobifemoral bypass    . BREAST SURGERY     left - granulomatous mastitis  . COLONOSCOPY    . 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 EYE SURGERY Bilateral    cataracts  . SBO with lysis adhesions      Current Outpatient Medications  Medication Sig Dispense Refill  . acetaminophen (TYLENOL) 500 MG tablet Take 500 mg by mouth every 6 (six) hours as needed (for pain).     Marland Kitchen acetaminophen-codeine (TYLENOL #3) 300-30 MG tablet take 1 tablet by mouth every 4 hours if needed for pain  0  . allopurinol (ZYLOPRIM) 100 MG tablet Take 100 mg by mouth daily.      Marland Kitchen aspirin 325 MG tablet Take 650 mg by mouth daily.     Marland Kitchen azithromycin (ZITHROMAX Z-PAK) 250 MG tablet One po daily x 5 days 5 tablet 0  . Blood Glucose Monitoring Suppl (FREESTYLE FREEDOM LITE) w/Device KIT Use to check blood sugar 2 times per day dx code E11.65 1 each 0  . clobetasol (TEMOVATE) 0.05 % external solution apply to affected area ON THE SCALP UP TO 2 TIMES A DAY AS NEEDED...  (REFER TO PRESCRIPTION NOTES).  0  . donepezil (ARICEPT) 10 MG tablet Take 1 tablet (10 mg total) by mouth at bedtime. 30 tablet 12  . FLUOCINOLONE  ACETONIDE SCALP 0.01 % OIL apply to affected area ON SCALP EVERY NIGHT AT BEDTIME, WASH UPON AWAKENING  0  . fluticasone (CUTIVATE) 0.05 % cream apply to affected area ON FACE UP TO 2 TIMES A DAY AS NEEDED  0  . fluticasone (FLONASE) 50 MCG/ACT nasal spray Place 1 spray into both nostrils daily. (Patient taking differently: Place 1 spray into both nostrils daily as needed for allergies. ) 16 g 2  . glucose blood (ONETOUCH VERIO) test strip Use to check blood sugar 2 times per day. Dx code E11.65 100 each 2  . insulin aspart (NOVOLOG) 100 UNIT/ML injection Inject 10 Units into the skin 3 (three) times daily before meals. (Patient taking differently: Inject 10 Units into the skin See admin instructions. Two to three times a day before meals) 20 mL 2  . insulin glargine (LANTUS) 100 UNIT/ML injection Inject 0.08 mLs (8 Units total) into the skin every morning. (Patient taking differently: Inject 13 Units into the skin every morning. )    . Insulin Pen Needle (NOVOFINE) 30G X 8 MM MISC Use to inject insulin 3 times daily 90 each 4  . ketoconazole (NIZORAL) 2 % shampoo Apply 1 application topically 2 (two) times a week. 120 mL 0  . Lancets (FREESTYLE) lancets Use as instructed to check blood sugar 2 times per day dx code E11.65 100 each 3  . levothyroxine (SYNTHROID, LEVOTHROID) 50 MCG tablet Take 1 tablet (50 mcg total) by mouth daily. 90 tablet 1  . lidocaine-prilocaine (EMLA) cream Apply 1 application topically once. AS DIRECTED  1  . loratadine (CLARITIN) 10 MG tablet Take 10 mg by mouth daily as needed for allergies.     . metroNIDAZOLE (METROGEL) 0.75 % gel APPLY TO AFFECTED AREA ON SKIN  ON FACE TWICE DAILY  0  . mupirocin ointment (BACTROBAN) 2 % Use as directed to affected areas 22 g prn  . Naftifine HCl (NAFTIN) 2 % CREA Apply to  feet daily for fungal infection 60 g prn  . pioglitazone (ACTOS) 30 MG tablet take 1 tablet by mouth once daily 30 tablet 2  . RENVELA 800 MG tablet Take 800-2,400 mg  by mouth See admin instructions. 1,600-2,400 mg with breakfast then 1,600 mg with lunch then 2,400 mg with dinner (evening meal) and 800 mg with each snack  0  . SENSIPAR 30 MG tablet Take 30 mg by mouth daily.   1  . simvastatin (ZOCOR) 20 MG tablet Take 20 mg by mouth at bedtime.     . triamcinolone ointment (KENALOG) 0.1 % Apply 1 application topically 2 (two) times daily. Use between thighs     No current facility-administered medications for this visit.     Allergies as of 07/30/2017 - Review Complete 07/30/2017  Allergen Reaction Noted  . Penicillins Rash     Vitals: BP (!) 145/56 (BP Location: Left Arm, Patient Position: Sitting) Comment (BP Location): left forearm d/t fistula in R arm & old fistula in LUA  Pulse 61   Ht _0  (1.803 m)   Wt 202 lb 9.6 oz (91.9 kg)   BMI 28.26 kg/m  Last Weight:  Wt Readings from Last 1 Encounters:  07/30/17 202 lb 9.6 oz (91.9 kg)   Last Height:   Ht Readings from Last 1 Encounters:  07/30/17 _1  (1.803 m)    MMSE - Mini Mental State Exam 07/30/2017  Orientation to time 5  Orientation to Place 5  Registration 3  Attention/ Calculation 5  Recall 2  Language- name 2 objects 2  Language- repeat 1  Language- follow 3 step command 3  Language- read & follow direction 1  Write a sentence 1  Copy design 0  Total score 28      Montreal Cognitive Assessment  01/24/2017 07/11/2016 05/20/2015  Visuospatial/ Executive (0/5) _2 Naming (0/3) _3 Attention: Read list of digits (0/2) _4 Attention: Read list of letters (0/1) _5 Attention: Serial 7 subtraction starting at 100 (0/3) _6 Language: Repeat phrase (0/2) _7 Language : Fluency (0/1) 1 0 0  Abstraction (0/2) _8 Delayed Recall (0/5) _9 Orientation (0/6) _10 Total _11 Adjusted Score (based on education) - - 22    Assessment/Plan:   UPDATE 10/18/2017CM Mr. Rackley, 76 year old male returns for follow-up. He has history of mild cognitive.  MOCA is stable. He is currently on Aricept denies side effects to the medication. He gets his medications through the [place]. He goes to dialysis 3 times a week. He continues to be fairly active. He is planning a vacation to [place] next month. He feels his memory is stable He continues to drive without difficulty. He says he has not gotten lost. He returns for reevaluation   Continue Aricept at current dose, follow-up in 6 months MMSE today 28 out of 30, patient feels stable If memory declines we'll sent for formal neurocognitive testing Discussed his sleeping habits, exercise as  tolerated, healthy diet Close management of vascular risk factors and obesity with primary care Avoid multitasking, use a daily planner   Sarina Ill, MD  [facility] [address] [place], [place] [address]  Phone [phone] Fax [phone]  A total of 15 minutes was spent face-to-face with this patient. Over half this time was spent on counseling patient on the MCI diagnosis  and different diagnostic and therapeutic options available.

## 2017-07-31 DIAGNOSIS — N186 End stage renal disease: Secondary | ICD-10-CM | POA: Diagnosis not present

## 2017-07-31 DIAGNOSIS — N2581 Secondary hyperparathyroidism of renal origin: Secondary | ICD-10-CM | POA: Diagnosis not present

## 2017-07-31 DIAGNOSIS — E1129 Type 2 diabetes mellitus with other diabetic kidney complication: Secondary | ICD-10-CM | POA: Diagnosis not present

## 2017-07-31 NOTE — Addendum Note (Signed)
Addended by: Lianne Cure A on: 07/31/2017 03:32 PM   Modules accepted: Orders

## 2017-08-01 DIAGNOSIS — R310 Gross hematuria: Secondary | ICD-10-CM | POA: Diagnosis not present

## 2017-08-01 DIAGNOSIS — N281 Cyst of kidney, acquired: Secondary | ICD-10-CM | POA: Diagnosis not present

## 2017-08-02 DIAGNOSIS — N186 End stage renal disease: Secondary | ICD-10-CM | POA: Diagnosis not present

## 2017-08-02 DIAGNOSIS — E1129 Type 2 diabetes mellitus with other diabetic kidney complication: Secondary | ICD-10-CM | POA: Diagnosis not present

## 2017-08-02 DIAGNOSIS — N2581 Secondary hyperparathyroidism of renal origin: Secondary | ICD-10-CM | POA: Diagnosis not present

## 2017-08-05 DIAGNOSIS — Z992 Dependence on renal dialysis: Secondary | ICD-10-CM | POA: Diagnosis not present

## 2017-08-05 DIAGNOSIS — N186 End stage renal disease: Secondary | ICD-10-CM | POA: Diagnosis not present

## 2017-08-05 DIAGNOSIS — D509 Iron deficiency anemia, unspecified: Secondary | ICD-10-CM | POA: Diagnosis not present

## 2017-08-07 ENCOUNTER — Telehealth: Payer: Self-pay

## 2017-08-07 DIAGNOSIS — N186 End stage renal disease: Secondary | ICD-10-CM | POA: Diagnosis not present

## 2017-08-07 DIAGNOSIS — K5792 Diverticulitis of intestine, part unspecified, without perforation or abscess without bleeding: Secondary | ICD-10-CM | POA: Insufficient documentation

## 2017-08-07 DIAGNOSIS — D509 Iron deficiency anemia, unspecified: Secondary | ICD-10-CM | POA: Diagnosis not present

## 2017-08-07 DIAGNOSIS — Z992 Dependence on renal dialysis: Secondary | ICD-10-CM | POA: Diagnosis not present

## 2017-08-07 NOTE — Telephone Encounter (Signed)
Per [facility] called pt to make yearly CPE, had to LVM for pt to return call to schedule

## 2017-08-10 DIAGNOSIS — Z992 Dependence on renal dialysis: Secondary | ICD-10-CM | POA: Diagnosis not present

## 2017-08-10 DIAGNOSIS — N186 End stage renal disease: Secondary | ICD-10-CM | POA: Diagnosis not present

## 2017-08-10 DIAGNOSIS — D509 Iron deficiency anemia, unspecified: Secondary | ICD-10-CM | POA: Diagnosis not present

## 2017-08-13 DIAGNOSIS — N2581 Secondary hyperparathyroidism of renal origin: Secondary | ICD-10-CM | POA: Diagnosis not present

## 2017-08-13 DIAGNOSIS — N186 End stage renal disease: Secondary | ICD-10-CM | POA: Diagnosis not present

## 2017-08-13 DIAGNOSIS — E1129 Type 2 diabetes mellitus with other diabetic kidney complication: Secondary | ICD-10-CM | POA: Diagnosis not present

## 2017-08-14 ENCOUNTER — Ambulatory Visit (INDEPENDENT_AMBULATORY_CARE_PROVIDER_SITE_OTHER): Payer: Medicare Other | Admitting: Internal Medicine

## 2017-08-14 ENCOUNTER — Ambulatory Visit
Admission: RE | Admit: 2017-08-14 | Discharge: 2017-08-14 | Disposition: A | Discharge status: Home / Self Care | Payer: Medicare Other | Source: Ambulatory Visit | Attending: Internal Medicine | Admitting: Internal Medicine

## 2017-08-14 ENCOUNTER — Encounter: Payer: Self-pay | Admitting: Internal Medicine

## 2017-08-14 ENCOUNTER — Other Ambulatory Visit: Payer: Medicare Other

## 2017-08-14 VITALS — BP 136/78 | HR 72 | Temp 97.8°F | Wt 200.0 lb

## 2017-08-14 DIAGNOSIS — R1032 Left lower quadrant pain: Secondary | ICD-10-CM | POA: Diagnosis not present

## 2017-08-14 DIAGNOSIS — K5792 Diverticulitis of intestine, part unspecified, without perforation or abscess without bleeding: Secondary | ICD-10-CM | POA: Diagnosis not present

## 2017-08-14 DIAGNOSIS — I779 Disorder of arteries and arterioles, unspecified: Secondary | ICD-10-CM

## 2017-08-14 DIAGNOSIS — R319 Hematuria, unspecified: Secondary | ICD-10-CM | POA: Diagnosis not present

## 2017-08-14 DIAGNOSIS — N186 End stage renal disease: Secondary | ICD-10-CM | POA: Diagnosis not present

## 2017-08-14 LAB — CBC WITH DIFFERENTIAL/PLATELET
Basophils Absolute: 49 cells/uL (ref 0–200)
Basophils Relative: 0.5 %
Eosinophils Absolute: 118 cells/uL (ref 15–500)
Eosinophils Relative: 1.2 %
HCT: 34.7 % — ABNORMAL LOW (ref 38.5–50.0)
Hemoglobin: 11.6 g/dL — ABNORMAL LOW (ref 13.2–17.1)
Lymphs Abs: 1911 cells/uL (ref 850–3900)
MCH: 35.3 pg — ABNORMAL HIGH (ref 27.0–33.0)
MCHC: 33.4 g/dL (ref 32.0–36.0)
MCV: 105.5 fL — ABNORMAL HIGH (ref 80.0–100.0)
MPV: 9.8 fL (ref 7.5–12.5)
Monocytes Relative: 7.7 %
Neutro Abs: 6968 cells/uL (ref 1500–7800)
Neutrophils Relative %: 71.1 %
Platelets: 293 10*3/uL (ref 140–400)
RBC: 3.29 10*6/uL — ABNORMAL LOW (ref 4.20–5.80)
RDW: 14.9 % (ref 11.0–15.0)
Total Lymphocyte: 19.5 %
WBC mixed population: 755 cells/uL (ref 200–950)
WBC: 9.8 10*3/uL (ref 3.8–10.8)

## 2017-08-14 MED ORDER — TRAMADOL HCL 50 MG PO TABS: 50.0000 mg | ORAL_TABLET | Freq: Two times a day (BID) | ORAL | 0 refills | Status: DC | PRN

## 2017-08-14 MED ORDER — PROMETHAZINE HCL 12.5 MG PO TABS: 12.5000 mg | ORAL_TABLET | Freq: Two times a day (BID) | ORAL | 0 refills | Status: DC | PRN

## 2017-08-14 NOTE — Patient Instructions (Signed)
Phenergan 1 p.o. every 12 hours as needed nausea.  Clear liquids until nausea resolves and then advance diet slowly.  Spoke with wife with these instructions.  Tramadol sparingly every 12 hours for pain.  Call next week with progress report.  If patient's condition worsens take him to the emergency department.  Give consideration to having vertebroplasty.

## 2017-08-14 NOTE — Progress Notes (Signed)
   Subjective:    Patient ID: [name], male    DOB: 1941/01/11, 76 y.o.   MRN: [id number]  HPI  Had dialysis in [place] on Saturday November 17 while on vacation. Then he drove home which took 4.5 hours. Picked up wife's heavy suitcase when arrived home, lost balance and fell. Heard a pop in his back when he picked up the suitcase and pain caused him to lose balance and fall. He has some memory issues and has been on dialysis about 3 years.History of DM followed by Dr. Dwyane Dee.   Says he is making urine since last dialysis on Saturday.Says he has had urology workup for stone because of hematuria. Had dialysis here in [facility]  yesterday. Now says he is nauseated. Has had LLQ abdominal pain. Had 3 stools today already. Remote history of diverticulitis.  Says he is short of breath but pulse ox is 96%.  Denies chest pain. No shaking chills. No cough.  Says he feels awful but cannot be specific other than above. No energy. Malaise. Drove himself here but appears weak.  Hx of small bowel obstruction treated with surgical lysis of adhesions in 1996.  Review of Systems see above     Objective:   Physical Exam Chest clear. Cor:RRR Abd: has wound mid abdomen which is chronic and has been seen at wound care center. Not draining. Tender LLQ with rebound tenderness. CBC with diff is normal.Cannot provide urine specimen.      Assessment & Plan:  Low back pain  Possible acute diverticulitis of the sigmoid colon  End-stage kidney disease  Nausea-likely secondary to compression fracture  Plan: He is to have a stat CT of the abdomen and pelvis.  Addendum: CT of the abdomen and pelvis shows a 25% the.  Endplate fracture of L3.  Chronic stable superior endplate compression of L5 with 50% height loss.  No evidence of kidney stone or diverticulitis.  Plan: Spoke with wife and gave her results.  Call in Phenergan 12.5 mg p.o. every 12 hours as needed nausea.  Tramadol 50 mg 1 p.o. every 12  hours as needed pain to take sparingly.  Consider consult for vertebroplasty next week if not improving.  40 minutes spent with patient in office this am and return trip to office at 3 pm for another 40 minutes spent reviewing results, speaking with patient's wife, and making recommendations plus documentation of exam and recommendations.  Office had closed for Thanksgiving holiday at 1 pm.

## 2017-08-16 DIAGNOSIS — E1129 Type 2 diabetes mellitus with other diabetic kidney complication: Secondary | ICD-10-CM | POA: Diagnosis not present

## 2017-08-16 DIAGNOSIS — N2581 Secondary hyperparathyroidism of renal origin: Secondary | ICD-10-CM | POA: Diagnosis not present

## 2017-08-16 DIAGNOSIS — N186 End stage renal disease: Secondary | ICD-10-CM | POA: Diagnosis not present

## 2017-08-19 ENCOUNTER — Other Ambulatory Visit: Payer: Self-pay | Admitting: Internal Medicine

## 2017-08-19 DIAGNOSIS — S32030A Wedge compression fracture of third lumbar vertebra, initial encounter for closed fracture: Secondary | ICD-10-CM

## 2017-08-19 DIAGNOSIS — N186 End stage renal disease: Secondary | ICD-10-CM | POA: Diagnosis not present

## 2017-08-19 DIAGNOSIS — N2581 Secondary hyperparathyroidism of renal origin: Secondary | ICD-10-CM | POA: Diagnosis not present

## 2017-08-19 DIAGNOSIS — E1129 Type 2 diabetes mellitus with other diabetic kidney complication: Secondary | ICD-10-CM | POA: Diagnosis not present

## 2017-08-21 ENCOUNTER — Other Ambulatory Visit: Payer: Self-pay | Admitting: Internal Medicine

## 2017-08-21 DIAGNOSIS — S32030A Wedge compression fracture of third lumbar vertebra, initial encounter for closed fracture: Secondary | ICD-10-CM

## 2017-08-21 DIAGNOSIS — N2581 Secondary hyperparathyroidism of renal origin: Secondary | ICD-10-CM | POA: Diagnosis not present

## 2017-08-21 DIAGNOSIS — M4807 Spinal stenosis, lumbosacral region: Secondary | ICD-10-CM

## 2017-08-21 DIAGNOSIS — N186 End stage renal disease: Secondary | ICD-10-CM | POA: Diagnosis not present

## 2017-08-21 DIAGNOSIS — E1129 Type 2 diabetes mellitus with other diabetic kidney complication: Secondary | ICD-10-CM | POA: Diagnosis not present

## 2017-08-23 ENCOUNTER — Other Ambulatory Visit: Payer: Self-pay | Admitting: Internal Medicine

## 2017-08-23 ENCOUNTER — Telehealth: Payer: Self-pay | Admitting: Internal Medicine

## 2017-08-23 DIAGNOSIS — N2581 Secondary hyperparathyroidism of renal origin: Secondary | ICD-10-CM | POA: Diagnosis not present

## 2017-08-23 DIAGNOSIS — Z992 Dependence on renal dialysis: Secondary | ICD-10-CM | POA: Diagnosis not present

## 2017-08-23 DIAGNOSIS — E1129 Type 2 diabetes mellitus with other diabetic kidney complication: Secondary | ICD-10-CM | POA: Diagnosis not present

## 2017-08-23 DIAGNOSIS — N186 End stage renal disease: Secondary | ICD-10-CM | POA: Diagnosis not present

## 2017-08-23 DIAGNOSIS — S32030A Wedge compression fracture of third lumbar vertebra, initial encounter for closed fracture: Secondary | ICD-10-CM

## 2017-08-23 DIAGNOSIS — E1122 Type 2 diabetes mellitus with diabetic chronic kidney disease: Secondary | ICD-10-CM | POA: Diagnosis not present

## 2017-08-23 MED ORDER — TRAMADOL HCL 50 MG PO TABS: 50.0000 mg | ORAL_TABLET | Freq: Two times a day (BID) | ORAL | 0 refills | Status: DC | PRN

## 2017-08-23 NOTE — Telephone Encounter (Signed)
Called in Tramadol to [facility] on [address].

## 2017-08-23 NOTE — Telephone Encounter (Signed)
Refill Tramadol as prescribed last week.

## 2017-08-23 NOTE — Telephone Encounter (Signed)
Patient needs refill on Tramadol called in to the [facility] on [address]. Please advise.

## 2017-08-25 ENCOUNTER — Ambulatory Visit
Admission: RE | Admit: 2017-08-25 | Discharge: 2017-08-25 | Disposition: A | Discharge status: Home / Self Care | Payer: Medicare Other | Source: Ambulatory Visit | Attending: Internal Medicine | Admitting: Internal Medicine

## 2017-08-25 DIAGNOSIS — M4807 Spinal stenosis, lumbosacral region: Secondary | ICD-10-CM

## 2017-08-25 DIAGNOSIS — M48061 Spinal stenosis, lumbar region without neurogenic claudication: Secondary | ICD-10-CM | POA: Diagnosis not present

## 2017-08-25 DIAGNOSIS — S32030A Wedge compression fracture of third lumbar vertebra, initial encounter for closed fracture: Secondary | ICD-10-CM

## 2017-08-26 DIAGNOSIS — N2581 Secondary hyperparathyroidism of renal origin: Secondary | ICD-10-CM | POA: Diagnosis not present

## 2017-08-26 DIAGNOSIS — N186 End stage renal disease: Secondary | ICD-10-CM | POA: Diagnosis not present

## 2017-08-26 DIAGNOSIS — E1129 Type 2 diabetes mellitus with other diabetic kidney complication: Secondary | ICD-10-CM | POA: Diagnosis not present

## 2017-08-26 DIAGNOSIS — D631 Anemia in chronic kidney disease: Secondary | ICD-10-CM | POA: Diagnosis not present

## 2017-08-27 ENCOUNTER — Encounter: Payer: Medicare Other | Attending: Physician Assistant | Admitting: Physician Assistant

## 2017-08-27 DIAGNOSIS — I120 Hypertensive chronic kidney disease with stage 5 chronic kidney disease or end stage renal disease: Secondary | ICD-10-CM | POA: Insufficient documentation

## 2017-08-27 DIAGNOSIS — X58XXXS Exposure to other specified factors, sequela: Secondary | ICD-10-CM | POA: Diagnosis not present

## 2017-08-27 DIAGNOSIS — E1122 Type 2 diabetes mellitus with diabetic chronic kidney disease: Secondary | ICD-10-CM | POA: Diagnosis not present

## 2017-08-27 DIAGNOSIS — E11622 Type 2 diabetes mellitus with other skin ulcer: Secondary | ICD-10-CM | POA: Diagnosis not present

## 2017-08-27 DIAGNOSIS — Z87891 Personal history of nicotine dependence: Secondary | ICD-10-CM | POA: Diagnosis not present

## 2017-08-27 DIAGNOSIS — S31102S Unspecified open wound of abdominal wall, epigastric region without penetration into peritoneal cavity, sequela: Secondary | ICD-10-CM | POA: Insufficient documentation

## 2017-08-27 DIAGNOSIS — N186 End stage renal disease: Secondary | ICD-10-CM | POA: Insufficient documentation

## 2017-08-27 DIAGNOSIS — Z992 Dependence on renal dialysis: Secondary | ICD-10-CM | POA: Diagnosis not present

## 2017-08-27 DIAGNOSIS — L98492 Non-pressure chronic ulcer of skin of other sites with fat layer exposed: Secondary | ICD-10-CM | POA: Diagnosis not present

## 2017-08-27 NOTE — Progress Notes (Signed)
[name], [name] ([id number]) Visit Report for 08/27/2017 Allergy List Details Patient Name: [name], [name]. Date of Service: 08/27/2017 8:00 AM Medical Record Number: [id number] Patient Account Number: [id number] Date of Birth/Sex: Dec 28, 1940 (76 y.o. Male) Treating RN: Montey Hora Primary Care Anadia Helmes: Tedra Senegal Other Clinician: Referring Anas Reister: Tedra Senegal Treating Byanka Landrus/Extender: STONE III, HOYT Weeks in Treatment: 0 Allergies Active Allergies penicillin Allergy Notes Electronic Signature(s) Signed: 08/27/2017 8:06:33 AM By: Montey Hora Entered By: Montey Hora on 08/27/2017 08:06:33 [name], [name] ([id number]) -------------------------------------------------------------------------------- Arrival Information Details Patient Name: [name]. Date of Service: 08/27/2017 8:00 AM Medical Record Number: [id number] Patient Account Number: [id number] Date of Birth/Sex: 02-Aug-1941 (76 y.o. Male) Treating RN: Montey Hora Primary Care [name]: Tedra Senegal Other Clinician: Referring Iyahna Obriant: Tedra Senegal Treating Malaiya Paczkowski/Extender: [name], HOYT Weeks in Treatment: 0 Visit Information Patient Arrived: Ambulatory Arrival Time: 08:22 Accompanied By: self Transfer Assistance: None Patient Identification Verified: Yes Secondary Verification Process Yes Completed: Patient Has Alerts: Yes Patient Alerts: Patient on Blood Thinner DMII aspirin 650 History Since Last Visit Added or deleted any medications: No Any new allergies or adverse reactions: No Had a fall or experienced change in activities of daily living that may affect risk of falls: No Signs or symptoms of abuse/neglect since last visito No Hospitalized since last visit: No Electronic Signature(s) Signed: 08/27/2017 4:18:57 PM By: Montey Hora Entered By: Montey Hora on 08/27/2017 08:22:40 [name], [name]  ([id number]) -------------------------------------------------------------------------------- Clinic Level of Care Assessment Details Patient Name: [name]. Date of Service: 08/27/2017 8:00 AM Medical Record Number: [id number] Patient Account Number: [id number] Date of Birth/Sex: October 27, 1940 (76 y.o. Male) Treating RN: Montey Hora Primary Care Yakov Bergen: Tedra Senegal Other Clinician: Referring Jacolyn Joaquin: Tedra Senegal Treating Taleigha Pinson/Extender: [name], HOYT Weeks in Treatment: 0 Clinic Level of Care Assessment Items TOOL 2 Quantity Score []  - Use when only an EandM is performed on the INITIAL visit 0 ASSESSMENTS - Nursing Assessment / Reassessment X - General Physical Exam (combine w/ comprehensive assessment (listed just below) when 1 20 performed on new pt. evals) X- 1 25 Comprehensive Assessment (HX, ROS, Risk Assessments, Wounds Hx, etc.) ASSESSMENTS - Wound and Skin Assessment / Reassessment X - Simple Wound Assessment / Reassessment - one wound 1 5 []  - 0 Complex Wound Assessment / Reassessment - multiple wounds []  - 0 Dermatologic / Skin Assessment (not related to wound area) ASSESSMENTS - Ostomy and/or Continence Assessment and Care []  - Incontinence Assessment and Management 0 []  - 0 Ostomy Care Assessment and Management (repouching, etc.) PROCESS - Coordination of Care X - Simple Patient / Family Education for ongoing care 1 15 []  - 0 Complex (extensive) Patient / Family Education for ongoing care []  - 0 Staff obtains Programmer, systems, Records, Test Results / Process Orders []  - 0 Staff telephones HHA, Nursing Homes / Clarify orders / etc []  - 0 Routine Transfer to another Facility (non-emergent condition) []  - 0 Routine Hospital Admission (non-emergent condition) X- 1 15 New Admissions / Biomedical engineer / Ordering NPWT, Apligraf, etc. []  - 0 Emergency Hospital Admission (emergent condition) X- 1 10 Simple Discharge Coordination []  - 0 Complex  (extensive) Discharge Coordination PROCESS - Special Needs []  - Pediatric / Minor Patient Management 0 []  - 0 Isolation Patient Management [name], [name] ([id number]) []  - 0 Hearing / Language / Visual special needs []  - 0 Assessment of Community assistance (transportation, D/C planning, etc.) []  - 0 Additional assistance / Altered mentation []  - 0 Support Surface(s) Assessment (bed, cushion,  seat, etc.) INTERVENTIONS - Wound Cleansing / Measurement X - Wound Imaging (photographs - any number of wounds) 1 5 []  - 0 Wound Tracing (instead of photographs) X- 1 5 Simple Wound Measurement - one wound []  - 0 Complex Wound Measurement - multiple wounds []  - 0 Simple Wound Cleansing - one wound []  - 0 Complex Wound Cleansing - multiple wounds INTERVENTIONS - Wound Dressings X - Small Wound Dressing one or multiple wounds 1 10 []  - 0 Medium Wound Dressing one or multiple wounds []  - 0 Large Wound Dressing one or multiple wounds []  - 0 Application of Medications - injection INTERVENTIONS - Miscellaneous []  - External ear exam 0 []  - 0 Specimen Collection (cultures, biopsies, blood, body fluids, etc.) []  - 0 Specimen(s) / Culture(s) sent or taken to Lab for analysis []  - 0 Patient Transfer (multiple staff / Civil Service fast streamer / Similar devices) []  - 0 Simple Staple / Suture removal (25 or less) []  - 0 Complex Staple / Suture removal (26 or more) []  - 0 Hypo / Hyperglycemic Management (close monitor of Blood Glucose) []  - 0 Ankle / Brachial Index (ABI) - do not check if billed separately Has the patient been seen at the hospital within the last three years: Yes Total Score: 110 Level Of Care: New/Established - Level 3 Electronic Signature(s) Signed: 08/27/2017 4:18:57 PM By: Montey Hora Entered By: Montey Hora on 08/27/2017 09:01:55 [name], [name] ([id number]) -------------------------------------------------------------------------------- Encounter Discharge Information  Details Patient Name: [name]. Date of Service: 08/27/2017 8:00 AM Medical Record Number: [id number] Patient Account Number: [id number] Date of Birth/Sex: November 05, 1940 (76 y.o. Male) Treating RN: Montey Hora Primary Care Petrona Wyeth: Tedra Senegal Other Clinician: Referring Gurley Climer: Tedra Senegal Treating Clydell Alberts/Extender: [name], HOYT Weeks in Treatment: 0 Encounter Discharge Information Items Discharge Pain Level: 0 Discharge Condition: Stable Ambulatory Status: Ambulatory Discharge Destination: Home Transportation: Private Auto Accompanied By: self Schedule Follow-up Appointment: Yes Medication Reconciliation completed and No provided to Patient/Care Myles Tavella: Provided on Clinical Summary of Care: 08/27/2017 Form Type Recipient Paper Patient WT Electronic Signature(s) Signed: 08/27/2017 9:03:27 AM By: Montey Hora Entered By: Montey Hora on 08/27/2017 09:03:27 [name], [name] ([id number]) -------------------------------------------------------------------------------- Multi Wound Chart Details Patient Name: [name]. Date of Service: 08/27/2017 8:00 AM Medical Record Number: [id number] Patient Account Number: [id number] Date of Birth/Sex: Jun 28, 1941 (76 y.o. Male) Treating RN: Montey Hora Primary Care Aryaman Haliburton: Tedra Senegal Other Clinician: Referring Alayzia Pavlock: Tedra Senegal Treating Dennise Bamber/Extender: [name], HOYT Weeks in Treatment: 0 Vital Signs Height(in): 72 Pulse(bpm): 68 Weight(lbs): 201 Blood Pressure(mmHg): 168/62 Body Mass Index(BMI): 27 Temperature(F): Respiratory Rate 18 (breaths/min): Photos: [N/A:N/A] Wound Location: Abdomen - midline N/A N/A Wounding Event: Pressure Injury N/A N/A Primary Etiology: Pressure Ulcer N/A N/A Comorbid History: Anemia, Hypertension, N/A N/A Peripheral Venous Disease, Type II Diabetes, Gout, Neuropathy Date Acquired: 08/13/2017 N/A N/A Weeks of Treatment: 0 N/A N/A Wound Status: Open N/A  N/A Measurements L x W x D 1x0.9x0.1 N/A N/A (cm) Area (cm) : 0.707 N/A N/A Volume (cm) : 0.071 N/A N/A % Reduction in Area: 0.00% N/A N/A % Reduction in Volume: 0.00% N/A N/A Classification: Category/Stage II N/A N/A Exudate Amount: Large N/A N/A Exudate Type: Sanguinous N/A N/A Exudate Color: red N/A N/A Wound Margin: Flat and Intact N/A N/A Granulation Amount: Large (67-100%) N/A N/A Granulation Quality: Red N/A N/A Necrotic Amount: None Present (0%) N/A N/A Exposed Structures: Fascia: No N/A N/A Fat Layer (Subcutaneous Tissue) Exposed: No Tendon: No Muscle: No Pinnock, [name] ([id number]) Joint:  No Bone: No Epithelialization: Small (1-33%) N/A N/A Periwound Skin Texture: Excoriation: No N/A N/A Induration: No Callus: No Crepitus: No Rash: No Scarring: No Periwound Skin Moisture: Maceration: No N/A N/A Dry/Scaly: No Periwound Skin Color: Atrophie Blanche: No N/A N/A Cyanosis: No Ecchymosis: No Erythema: No Hemosiderin Staining: No Mottled: No Pallor: No Rubor: No Temperature: No Abnormality N/A N/A Tenderness on Palpation: Yes N/A N/A Wound Preparation: Ulcer Cleansing: N/A N/A Rinsed/Irrigated with Saline Topical Anesthetic Applied: Other: lidocaine 4% Treatment Notes Electronic Signature(s) Signed: 08/27/2017 8:44:27 AM By: Montey Hora Entered By: Montey Hora on 08/27/2017 08:44:27 [name], [name] ([id number]) -------------------------------------------------------------------------------- [place] Details Patient Name: [name]. Date of Service: 08/27/2017 8:00 AM Medical Record Number: [id number] Patient Account Number: [id number] Date of Birth/Sex: 1941-07-08 (76 y.o. Male) Treating RN: Montey Hora Primary Care Kateline Kinkade: Tedra Senegal Other Clinician: Referring Zoe Creasman: Tedra [place]egal Treating Avanni Turnbaugh/Extender: [name], HOYT Weeks in Treatment: 0 Active Inactive ` Abuse / Safety / Falls / Self Care  Management Nursing Diagnoses: Impaired physical mobility Goals: Patient will not experience any injury related to falls Date Initiated: 08/27/2017 Target Resolution Date: 11/30/2017 Goal Status: Active Interventions: Assess fall risk on admission and as needed Notes: ` Orientation to the Wound Care Program Nursing Diagnoses: Knowledge deficit related to the wound healing center program Goals: Patient/caregiver will verbalize understanding of the [facility] Date Initiated: 08/27/2017 Target Resolution Date: 11/30/2017 Goal Status: Active Interventions: Provide education on orientation to the wound center Notes: ` Wound/Skin Impairment Nursing Diagnoses: Impaired tissue integrity Goals: Ulcer/skin breakdown will heal within 14 weeks Date Initiated: 08/27/2017 Target Resolution Date: 11/30/2017 Goal Status: Active Interventions: Hritz, [name] ([id number]) Assess patient/caregiver ability to obtain necessary supplies Assess patient/caregiver ability to perform ulcer/skin care regimen upon admission and as needed Assess ulceration(s) every visit Notes: Electronic Signature(s) Signed: 08/27/2017 8:44:03 AM By: Montey Hora Entered By: Montey Hora on 08/27/2017 08:44:03 Vida, [name] ([id number]) -------------------------------------------------------------------------------- Pain Assessment Details Patient Name: [name]. Date of Service: 08/27/2017 8:00 AM Medical Record Number: [id number] Patient Account Number: [id number] Date of Birth/Sex: [birth year] (76 y.o. Male) Treating RN: Montey Hora Primary Care Jashan Cotten: Tedra Senegal Other Clinician: Referring Maijor Hornig: Tedra [place]egal Treating Yaritza Leist/Extender: [name], HOYT Weeks in Treatment: 0 Active Problems Location of Pain Severity and Description of Pain Patient Has Paino Yes Site Locations Pain Location: Generalized Pain With Dressing Change: No Pain Management and Medication Current  Pain Management: Notes Topical or injectable lidocaine is offered to patient for acute pain when surgical debridement is performed. If needed, Patient is instructed to use over the counter pain medication for the following 24-48 hours after debridement. Wound care MDs do not prescribed pain medications. Patient has chronic pain or uncontrolled pain. Patient has been instructed to make an appointment with their Primary Care Physician for pain management. Electronic Signature(s) Signed: 08/27/2017 4:18:57 PM By: Montey Hora Entered By: Montey Hora on 08/27/2017 08:23:15 Fike, [name] ([id number]) -------------------------------------------------------------------------------- Patient/Caregiver Education Details Patient Name: [name]. Date of Service: 08/27/2017 8:00 AM Medical Record Number: [id number] Patient Account Number: [id number] Date of Birth/Gender: 11/30/1940 (76 y.o. Male) Treating RN: Montey Hora Primary Care Physician: Tedra Senegal Other Clinician: Referring Physician: Tedra Senegal Treating Physician/Extender: Sharalyn Ink in Treatment: 0 Education Assessment Education Provided To: Patient Education Topics Provided Wound/Skin Impairment: Handouts: Other: wound care as ordered Methods: Demonstration, Explain/Verbal Responses: State content correctly Electronic Signature(s) Signed: 08/27/2017 4:18:57 PM By: Montey Hora Entered By: Montey Hora on 08/27/2017 09:04:01  [name], [name] ([id number]) -------------------------------------------------------------------------------- Wound Assessment Details Patient Name: [name], [name]. Date of Service: 08/27/2017 8:00 AM Medical Record Number: [id number] Patient Account Number: [id number] Date of Birth/Sex: [birth year] (76 y.o. Male) Treating RN: Montey Hora Primary Care Ziza Hastings: Tedra Senegal Other Clinician: Referring Amerah Puleo: Tedra [place]egal Treating Ashawn Rinehart/Extender: [name], HOYT Weeks  in Treatment: 0 Wound Status Wound Number: 4 Primary Pressure Ulcer Etiology: Wound Location: Abdomen - midline Wound Open Wounding Event: Pressure Injury Status: Date Acquired: 08/13/2017 Comorbid Anemia, Hypertension, Peripheral Venous Weeks Of Treatment: 0 History: Disease, Type II Diabetes, Gout, Neuropathy Clustered Wound: No Photos Photo Uploaded By: Montey Hora on 08/27/2017 08:42:23 Wound Measurements Length: (cm) 1 Width: (cm) 0.9 Depth: (cm) 0.1 Area: (cm) 0.707 Volume: (cm) 0.071 % Reduction in Area: 0% % Reduction in Volume: 0% Epithelialization: Small (1-33%) Tunneling: No Undermining: No Wound Description Classification: Category/Stage II Wound Margin: Flat and Intact Exudate Amount: Large Exudate Type: Sanguinous Exudate Color: red Foul Odor After Cleansing: No Slough/Fibrino No Wound Bed Granulation Amount: Large (67-100%) Exposed Structure Granulation Quality: Red Fascia Exposed: No Necrotic Amount: None Present (0%) Fat Layer (Subcutaneous Tissue) Exposed: No Tendon Exposed: No Muscle Exposed: No Joint Exposed: No Bone Exposed: No Periwound Skin Texture [name], [name] ([id number]) Texture Color No Abnormalities Noted: No No Abnormalities Noted: No Callus: No Atrophie Blanche: No Crepitus: No Cyanosis: No Excoriation: No Ecchymosis: No Induration: No Erythema: No Rash: No Hemosiderin Staining: No Scarring: No Mottled: No Pallor: No Moisture Rubor: No No Abnormalities Noted: No Dry / Scaly: No Temperature / Pain Maceration: No Temperature: No Abnormality Tenderness on Palpation: Yes Wound Preparation Ulcer Cleansing: Rinsed/Irrigated with Saline Topical Anesthetic Applied: Other: lidocaine 4%, Treatment Notes Wound #4 (Abdomen - midline) 1. Cleansed with: Clean wound with Normal Saline 2. Anesthetic Topical Lidocaine 4% cream to wound bed prior to debridement 4. Dressing Applied: Hydrafera Blue 5. Secondary  Sasser Signature(s) Signed: 08/27/2017 4:18:57 PM By: Montey Hora Entered By: Montey Hora on 08/27/2017 08:36:53 [name], [name] ([id number]) -------------------------------------------------------------------------------- [place] Details Patient Name: [name]. Date of Service: 08/27/2017 8:00 AM Medical Record Number: [id number] Patient Account Number: [id number] Date of Birth/Sex: Feb 16, 1941 (76 y.o. Male) Treating RN: Montey Hora Primary Care Leliana Kontz: Tedra Senegal Other Clinician: Referring Arial Galligan: Tedra [place]egal Treating Avonelle Viveros/Extender: [name], HOYT Weeks in Treatment: 0 Vital Signs Time Taken: 08:23 Pulse (bpm): 68 Height (in): 72 Respiratory Rate (breaths/min): 18 Source: Measured Blood Pressure (mmHg): 168/62 Weight (lbs): 201 Reference Range: 80 - 120 mg / dl Source: Measured Body Mass Index (BMI): 27.3 Notes patient eating ice chips, could not register temperature Electronic Signature(s) Signed: 08/27/2017 4:18:57 PM By: Montey Hora Entered By: Montey Hora on 08/27/2017 08:30:55

## 2017-08-27 NOTE — Progress Notes (Signed)
WELBY, MONTMINY ([id number]) Visit Report for 08/27/2017 Abuse/Suicide Risk Screen Details Patient Name: [name], [name]. Date of Service: 08/27/2017 8:00 AM Medical Record Number: [id number] Patient Account Number: [id number] Date of Birth/Sex: Nov 05, 1940 (76 y.o. Male) Treating RN: Montey Hora Primary Care Chuckie Mccathern: Tedra Senegal Other Clinician: Referring Ramesha Poster: Tedra [place] Treating Zayvian Mcmurtry/Extender: [name], HOYT Weeks in Treatment: 0 Abuse/Suicide Risk Screen Items Answer ABUSE/SUICIDE RISK SCREEN: Has anyone close to you tried to hurt or harm you recentlyo No Do you feel uncomfortable with anyone in your familyo No Has anyone forced you do things that you didnot want to doo No Do you have any thoughts of harming yourselfo No Patient displays signs or symptoms of abuse and/or neglect. No Electronic Signature(s) Signed: 08/27/2017 8:07:09 AM By: Montey Hora Entered By: Montey Hora on 08/27/2017 08:07:09 Breeding, [name] ([id number]) -------------------------------------------------------------------------------- Activities of Daily Living Details Patient Name: [name]. Date of Service: 08/27/2017 8:00 AM Medical Record Number: [id number] Patient Account Number: [id number] Date of Birth/Sex: 1941/07/21 (76 y.o. Male) Treating RN: Montey Hora Primary Care Darrian Grzelak: Tedra [place]egal Other Clinician: Referring Teagen Bucio: Tedra [place]egal Treating Jerone Cudmore/Extender: [name], HOYT Weeks in Treatment: 0 Activities of Daily Living Items Answer Activities of Daily Living (Please select one for each item) Drive Automobile Completely Able Take Medications Completely Able Use Telephone Completely Able Care for Appearance Completely Able Use Toilet Completely Able Bath / Shower Completely Able Dress Self Completely Able Feed Self Completely Able Walk Completely Able Get In / Out Bed Completely Able Housework Completely Able Prepare Meals Completely [place] for Self Completely Able Electronic Signature(s) Signed: 08/27/2017 8:07:36 AM By: Montey Hora Entered By: Montey Hora on 08/27/2017 08:07:36 [place], [name] ([id number]) -------------------------------------------------------------------------------- Education Assessment Details Patient Name: [name]. Date of Service: 08/27/2017 8:00 AM Medical Record Number: [id number] Patient Account Number: [id number] Date of Birth/Sex: Nov 30, 1940 (76 y.o. Male) Treating RN: Montey Hora Primary Care Rether Rison: Tedra Senegal Other Clinician: Referring Taliyah Watrous: Tedra [place]egal Treating Radames Mejorado/Extender: [name], HOYT Weeks in Treatment: 0 Primary Learner Assessed: Patient Learning Preferences/Education Level/Primary Language Learning Preference: Explanation, Demonstration Highest Education Level: College or Above Preferred Language: English Cognitive Barrier Assessment/Beliefs Language Barrier: No Translator Needed: No Memory Deficit: No Emotional Barrier: No Cultural/Religious Beliefs Affecting Medical Care: No Physical Barrier Assessment Impaired Vision: No Impaired Hearing: No Decreased Hand dexterity: No Knowledge/Comprehension Assessment Knowledge Level: Medium Comprehension Level: Medium Ability to understand written Medium instructions: Ability to understand verbal Medium instructions: Motivation Assessment Anxiety Level: Calm Cooperation: Cooperative Education Importance: Acknowledges Need Interest in Health Problems: Asks Questions Perception: Coherent Willingness to Engage in Self- Medium Management Activities: Readiness to Engage in Self- Medium Management Activities: Electronic Signature(s) Signed: 08/27/2017 8:08:00 AM By: Montey Hora Entered By: Montey Hora on 08/27/2017 08:08:00 [place], [name] ([id number]) -------------------------------------------------------------------------------- Fall Risk Assessment  Details Patient Name: [name]. Date of Service: 08/27/2017 8:00 AM Medical Record Number: [id number] Patient Account Number: [id number] Date of Birth/Sex: 1941/01/01 (76 y.o. Male) Treating RN: Montey Hora Primary Care Chiniqua Kilcrease: Tedra Senegal Other Clinician: Referring Westyn Driggers: Tedra [place]egal Treating Tysean Vand[place]et/Extender: [name], HOYT Weeks in Treatment: 0 Fall Risk Assessment Items Have you had 2 or more falls in the last 12 monthso 0 No Have you had any fall that resulted in injury in the last 12 monthso 0 No FALL RISK ASSESSMENT: History of falling - immediate or within 3 months 0 No Secondary diagnosis 0 No Ambulatory aid None/bed rest/wheelchair/nurse 0 Yes Crutches/cane/walker 0 No Furniture  0 No IV Access/Saline Lock 0 No Gait/Training Normal/bed rest/immobile 0 No Weak 10 Yes Impaired 0 No Mental Status Oriented to own ability 0 Yes Electronic Signature(s) Signed: 08/27/2017 8:08:25 AM By: Montey Hora Entered By: Montey Hora on 08/27/2017 08:08:25 [place], [name] ([id number]) -------------------------------------------------------------------------------- Foot Assessment Details Patient Name: [name]. Date of Service: 08/27/2017 8:00 AM Medical Record Number: [id number] Patient Account Number: [id number] Date of Birth/Sex: [birth year] (76 y.o. Male) Treating RN: Montey Hora Primary Care Caterin Tabares: Tedra Senegal Other Clinician: Referring Trevell Pariseau: Tedra [place]egal Treating Nahzir Pohle/Extender: [name], HOYT Weeks in Treatment: 0 Foot Assessment Items Site Locations + = Sensation present, - = Sensation absent, C = Callus, U = Ulcer R = Redness, W = Warmth, M = Maceration, PU = Pre-ulcerative lesion F = Fissure, S = Swelling, D = Dryness Assessment Right: Left: Other Deformity: No No Prior Foot Ulcer: No No Prior Amputation: No No Charcot Joint: No No Ambulatory Status: Ambulatory Without Help Gait: Steady Electronic Signature(s) Signed:  08/27/2017 8:09:16 AM By: Montey Hora Entered By: Montey Hora on 08/27/2017 08:09:16 [place], [name] ([id number]) -------------------------------------------------------------------------------- Nutrition Risk Assessment Details Patient Name: [name]. Date of Service: 08/27/2017 8:00 AM Medical Record Number: [id number] Patient Account Number: [id number] Date of Birth/Sex: [birth year] (76 y.o. Male) Treating RN: Montey Hora Primary Care Tarrie Mcmichen: Tedra Senegal Other Clinician: Referring Sharece Fleischhacker: Tedra [place]egal Treating Chamika Cunanan/Extender: [name], HOYT Weeks in Treatment: 0 Height (in): 72 Weight (lbs): 201 Body Mass Index (BMI): 27.3 Nutrition Risk Assessment Items NUTRITION RISK SCREEN: I have an illness or condition that made me change the kind and/or amount of 0 No food I eat I eat fewer than two meals per day 0 No I eat few fruits and vegetables, or milk products 0 No I have three or more drinks of beer, liquor or wine almost every day 0 No I have tooth or mouth problems that make it hard for me to eat 0 No I don't always have enough money to buy the food I need 0 No I eat alone most of the time 0 No I take three or more different prescribed or over-the-counter drugs a day 1 Yes Without wanting to, I have lost or gained 10 pounds in the last six months 0 No I am not always physically able to shop, cook and/or feed myself 0 No Nutrition Protocols Good Risk Protocol 0 No interventions needed Moderate Risk Protocol Electronic Signature(s) Signed: 08/27/2017 8:08:35 AM By: Montey Hora Entered By: Montey Hora on 08/27/2017 08:08:35

## 2017-08-28 DIAGNOSIS — N2581 Secondary hyperparathyroidism of renal origin: Secondary | ICD-10-CM | POA: Diagnosis not present

## 2017-08-28 DIAGNOSIS — D631 Anemia in chronic kidney disease: Secondary | ICD-10-CM | POA: Diagnosis not present

## 2017-08-28 DIAGNOSIS — E1129 Type 2 diabetes mellitus with other diabetic kidney complication: Secondary | ICD-10-CM | POA: Diagnosis not present

## 2017-08-28 DIAGNOSIS — N186 End stage renal disease: Secondary | ICD-10-CM | POA: Diagnosis not present

## 2017-08-29 ENCOUNTER — Encounter: Payer: Self-pay | Admitting: Dietician

## 2017-08-29 ENCOUNTER — Encounter: Payer: Medicare Other | Attending: Endocrinology | Admitting: Dietician

## 2017-08-29 ENCOUNTER — Ambulatory Visit: Payer: Medicare Other | Admitting: Surgery

## 2017-08-29 DIAGNOSIS — E1165 Type 2 diabetes mellitus with hyperglycemia: Secondary | ICD-10-CM | POA: Insufficient documentation

## 2017-08-29 DIAGNOSIS — Z794 Long term (current) use of insulin: Secondary | ICD-10-CM | POA: Insufficient documentation

## 2017-08-29 DIAGNOSIS — N186 End stage renal disease: Secondary | ICD-10-CM

## 2017-08-29 DIAGNOSIS — Z713 Dietary counseling and surveillance: Secondary | ICD-10-CM | POA: Diagnosis not present

## 2017-08-29 DIAGNOSIS — Z992 Dependence on renal dialysis: Secondary | ICD-10-CM

## 2017-08-29 DIAGNOSIS — IMO0001 Reserved for inherently not codable concepts without codable children: Secondary | ICD-10-CM

## 2017-08-29 DIAGNOSIS — E119 Type 2 diabetes mellitus without complications: Secondary | ICD-10-CM

## 2017-08-29 NOTE — Progress Notes (Signed)
Diabetes Self-Management Education  Visit Type: First/Initial  Appt. Start Time: 0910 Appt. End Time: 1010  08/29/2017  [name], identified by name and date of birth, is a 76 y.o. male with a diagnosis of Diabetes: Type 2. 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  He goes Monday, Wednesday, Friday at 7am. He injured his back 2 weeks ago reporting that he broke a couple of vertebrae and it is to be "glued" in a week or so.  He is in pain which has further decreased his appetite and made it more difficult to care to his diabetes including checking his blood sugar.  He currently is only checking 3 times per week.  He reported beginning to urinate after injuring his back.  Medications include:   Actos Lantus 10 units each am Novolog 10 units before breakfast and dinner.  He is to take 5-10 units before lunch but does not.  Weight down to 196 lbs today.   Patient lives with his wife.  She does the shopping and cooking.  He is retired from [employer].  He uses "No Salt".  He reports taking 2 phos binders prior to his meals.  ASSESSMENT  Height 5\' 11"  (1.803 m), weight 196 lb (88.9 kg). Body mass index is 27.34 kg/m.  Diabetes Self-Management Education - 08/29/17 0927      Visit Information   Visit Type  First/Initial      Initial Visit   Diabetes Type  Type 2    Are you currently following a meal plan?  No    Are you taking your medications as prescribed?  Yes    Date Diagnosed  1996      Health Coping   How would you rate your overall health?  Poor      Psychosocial Assessment   Patient Belief/Attitude about Diabetes  Other (comment) overwelmed by health    Self-care barriers  None    Self-management support  Doctor's office;Family    Other persons present  Patient    Patient Concerns  Nutrition/Meal planning    Special Needs  None    Preferred Learning Style  No preference indicated    Learning Readiness  Ready    How often do you need to  have someone help you when you read instructions, pamphlets, or other written materials from your doctor or pharmacy?  1 - Never    What is the last grade level you completed in school?  college      Pre-Education Assessment   Patient understands the diabetes disease and treatment process.  Needs Review    Patient understands incorporating nutritional management into lifestyle.  Needs Review    Patient undertands incorporating physical activity into lifestyle.  Needs Review    Patient understands using medications safely.  Needs Review    Patient understands monitoring blood glucose, interpreting and using results  Needs Review    Patient understands prevention, detection, and treatment of acute complications.  Needs Review    Patient understands prevention, detection, and treatment of chronic complications.  Needs Review    Patient understands how to develop strategies to address psychosocial issues.  Needs Review    Patient understands how to develop strategies to promote health/change behavior.  Needs Review      Complications   Last HgB A1C per patient/outside source  8.6 % 05/09/17 and Fructoamine 454 07/12/17    How often do you check your blood sugar?  1-2 times/day currently checking 3-4  times per week [name] to increased pain    Fasting Blood glucose range (mg/dL)  70-129    Have you had a dilated eye exam in the past 12 months?  Yes    Have you had a dental exam in the past 12 months?  Yes    Are you checking your feet?  Yes    How many days per week are you checking your feet?  7      Dietary Intake   Breakfast  Nepro or Ensure and Nekot cookies with peanut butter OR eggs, grits, bacon, wheat toast    Snack (morning)  HD days:  PB crackers, nutrigrain bars, candy, potato chips     Lunch  canned or homemade soup and cracker OR bologna sandwich, fruit occasionally and skips on non dialysis days    Snack (afternoon)  nabs or candy    Dinner  baked chicken, rice, broccoli, 1 slice [employer]  toast,     Snack (evening)  seldom or tootsie roll or 2 cookies    Beverage(s)  ice, occasional 2 ounces diet gingerale or flavored water      Exercise   Exercise Type  ADL's walked a lot prior to injury    How many days per week to you exercise?  0    How many minutes per day do you exercise?  0    Total minutes per week of exercise  0      Patient Education   Previous Diabetes Education  Yes (please comment) not recently    Nutrition management   Meal options for control of blood glucose level and chronic complications.;Other (comment) foods low in phosphorous, potassium, sodium reviewed along with portions, options for snacks discussed    Medications  Reviewed patients medication for diabetes, action, purpose, timing of dose and side effects.    Monitoring  Purpose and frequency of SMBG.;Identified appropriate SMBG and/or A1C goals.    Psychosocial adjustment  Worked with patient to identify barriers to care and solutions    Personal strategies to promote health  Lifestyle issues that need to be addressed for better diabetes care      Individualized Goals (developed by patient)   Nutrition  Follow meal plan discussed    Physical Activity  Not Applicable    Medications  take my medication as prescribed    Monitoring   test my blood glucose as discussed    Problem Solving  times to test blood sugar and take lunch time insulin    Reducing Risk  -- avoid skipping meals    Health Coping  discuss diabetes with (comment) MD,RD,CDE      Post-Education Assessment   Patient understands the diabetes disease and treatment process.  Demonstrates understanding / competency    Patient understands incorporating nutritional management into lifestyle.  Needs Review    Patient undertands incorporating physical activity into lifestyle.  Demonstrates understanding / competency    Patient understands using medications safely.  Demonstrates understanding / competency    Patient understands monitoring  blood glucose, interpreting and using results  Demonstrates understanding / competency    Patient understands prevention, detection, and treatment of acute complications.  Demonstrates understanding / competency    Patient understands prevention, detection, and treatment of chronic complications.  Demonstrates understanding / competency    Patient understands how to develop strategies to address psychosocial issues.  Demonstrates understanding / competency    Patient understands how to develop strategies to promote health/change behavior.  Needs Review  Outcomes   Expected Outcomes  Demonstrated interest in learning. Expect positive outcomes    Future DMSE  PRN    Program Status  Completed       Individualized Plan for Diabetes Self-Management Training:   Learning Objective:  Patient will have a greater understanding of diabetes self-management. Patient education plan is to attend individual and/or group sessions per assessed needs and concerns.   Plan:   Patient Instructions  Are you taking the Allopurinol for your gout? (listed here per patient request)  Change to white bread Avoid using the "No Salt".  Season with herbs, garlic powder, onion powder, lemon juice, vinegar etc. Avoid processed meats (instead of bologna, use leftover meat from dinner or egg salad or Boar's head low sodium [place] from deli or tuna that is low sodium or rinsed.  Consider more nutrition choices for breakfast on dialysis days:  Protein shake (Nepro)  Boiled egg (1-2)  Fruit  Unsalted crackers with small amount of peanut butter or 1 ounce cheese (if phos is OK)  Homemade soup is better than canned.  Leach potatoes (This removes the potassium.)  Place peeled, sliced potatoes in a large pot of water.  Let soak overnight or at least 4 hours.  Drain water, add fresh water and cook.  Remember to take your insulin before lunch. Get back into the habit of checking your blood sugar 2 times per day  (rotate)  Blood sugar goals:   Fasting 90-130   2 hour after you start a meal 100-160    Expected Outcomes:  Demonstrated interest in learning. Expect positive outcomes  Education material provided: Kidney diet pyramid  If problems or questions, patient to contact team via:  Phone  Future DSME appointment: PRN.  Encouraged follow up next year when he sees Dr. Dwyane Dee.

## 2017-08-29 NOTE — Patient Instructions (Addendum)
Are you taking the Allopurinol for your gout? (listed here per patient request)  Change to white bread Avoid using the "No Salt".  Season with herbs, garlic powder, onion powder, lemon juice, vinegar etc. Avoid processed meats (instead of bologna, use leftover meat from dinner or egg salad or Boar's head low sodium [place] from deli or tuna that is low sodium or rinsed.  Consider more nutrition choices for breakfast on dialysis days:  Protein shake (Nepro)  Boiled egg (1-2)  Fruit  Unsalted crackers with small amount of peanut butter or 1 ounce cheese (if phos is OK)  Homemade soup is better than canned.  Leach potatoes (This removes the potassium.)  Place peeled, sliced potatoes in a large pot of water.  Let soak overnight or at least 4 hours.  Drain water, add fresh water and cook.  Remember to take your insulin before lunch. Get back into the habit of checking your blood sugar 2 times per day (rotate)  Blood sugar goals:   Fasting 90-130   2 hour after you start a meal 100-160

## 2017-08-30 DIAGNOSIS — N2581 Secondary hyperparathyroidism of renal origin: Secondary | ICD-10-CM | POA: Diagnosis not present

## 2017-08-30 DIAGNOSIS — D631 Anemia in chronic kidney disease: Secondary | ICD-10-CM | POA: Diagnosis not present

## 2017-08-30 DIAGNOSIS — N186 End stage renal disease: Secondary | ICD-10-CM | POA: Diagnosis not present

## 2017-08-30 DIAGNOSIS — E1129 Type 2 diabetes mellitus with other diabetic kidney complication: Secondary | ICD-10-CM | POA: Diagnosis not present

## 2017-09-02 DIAGNOSIS — N2581 Secondary hyperparathyroidism of renal origin: Secondary | ICD-10-CM | POA: Diagnosis not present

## 2017-09-02 DIAGNOSIS — E1129 Type 2 diabetes mellitus with other diabetic kidney complication: Secondary | ICD-10-CM | POA: Diagnosis not present

## 2017-09-02 DIAGNOSIS — D631 Anemia in chronic kidney disease: Secondary | ICD-10-CM | POA: Diagnosis not present

## 2017-09-02 DIAGNOSIS — N186 End stage renal disease: Secondary | ICD-10-CM | POA: Diagnosis not present

## 2017-09-03 ENCOUNTER — Ambulatory Visit
Admission: RE | Admit: 2017-09-03 | Discharge: 2017-09-03 | Disposition: A | Discharge status: Home / Self Care | Payer: Medicare Other | Source: Ambulatory Visit | Attending: Internal Medicine | Admitting: Internal Medicine

## 2017-09-03 ENCOUNTER — Ambulatory Visit: Payer: Medicare Other | Admitting: Internal Medicine

## 2017-09-03 DIAGNOSIS — S32030A Wedge compression fracture of third lumbar vertebra, initial encounter for closed fracture: Secondary | ICD-10-CM

## 2017-09-03 DIAGNOSIS — M549 Dorsalgia, unspecified: Secondary | ICD-10-CM | POA: Diagnosis not present

## 2017-09-04 ENCOUNTER — Ambulatory Visit: Payer: Medicare Other | Admitting: Internal Medicine

## 2017-09-04 ENCOUNTER — Telehealth: Payer: Self-pay | Admitting: Internal Medicine

## 2017-09-04 DIAGNOSIS — N186 End stage renal disease: Secondary | ICD-10-CM | POA: Diagnosis not present

## 2017-09-04 DIAGNOSIS — E1129 Type 2 diabetes mellitus with other diabetic kidney complication: Secondary | ICD-10-CM | POA: Diagnosis not present

## 2017-09-04 DIAGNOSIS — N2581 Secondary hyperparathyroidism of renal origin: Secondary | ICD-10-CM | POA: Diagnosis not present

## 2017-09-04 DIAGNOSIS — D631 Anemia in chronic kidney disease: Secondary | ICD-10-CM | POA: Diagnosis not present

## 2017-09-04 MED ORDER — TRAMADOL HCL 50 MG PO TABS: 50.0000 mg | ORAL_TABLET | Freq: Two times a day (BID) | ORAL | 0 refills | Status: DC | PRN

## 2017-09-04 NOTE — Telephone Encounter (Signed)
Patient has the procedure on Tuesday, 12/18 at 10:00.  Wife wants to know if you can send a few more Tramadol to pharmacy to get him through until the procedure next week.   Pharmacy:  [facility] at [facility].  Best # for contact:  [phone]

## 2017-09-04 NOTE — Telephone Encounter (Signed)
RX WAS CALLED IN TO PT'S PHARMACY.  [name] was notified.

## 2017-09-04 NOTE — Telephone Encounter (Signed)
Refill Tramadol from recent prescription please

## 2017-09-05 ENCOUNTER — Other Ambulatory Visit (INDEPENDENT_AMBULATORY_CARE_PROVIDER_SITE_OTHER): Payer: Medicare Other

## 2017-09-05 DIAGNOSIS — Z794 Long term (current) use of insulin: Secondary | ICD-10-CM

## 2017-09-05 DIAGNOSIS — E1165 Type 2 diabetes mellitus with hyperglycemia: Secondary | ICD-10-CM

## 2017-09-05 LAB — LIPID PANEL
Cholesterol: 79 mg/dL (ref 0–200)
HDL: 38 mg/dL — ABNORMAL LOW (ref >39.00)
LDL Cholesterol: 21 mg/dL (ref 0–99)
NonHDL: 40.98
Total CHOL/HDL Ratio: 2
Triglycerides: 99 mg/dL (ref 0.0–149.0)
VLDL: 19.8 mg/dL (ref 0.0–40.0)

## 2017-09-05 LAB — HEMOGLOBIN A1C: Hgb A1c MFr Bld: 7.7 % — ABNORMAL HIGH (ref 4.6–6.5)

## 2017-09-05 LAB — ALT: ALT: 10 U/L (ref 0–53)

## 2017-09-05 LAB — GLUCOSE, RANDOM: Glucose, Bld: 166 mg/dL — ABNORMAL HIGH (ref 70–99)

## 2017-09-06 DIAGNOSIS — D631 Anemia in chronic kidney disease: Secondary | ICD-10-CM | POA: Diagnosis not present

## 2017-09-06 DIAGNOSIS — N186 End stage renal disease: Secondary | ICD-10-CM | POA: Diagnosis not present

## 2017-09-06 DIAGNOSIS — E1129 Type 2 diabetes mellitus with other diabetic kidney complication: Secondary | ICD-10-CM | POA: Diagnosis not present

## 2017-09-06 DIAGNOSIS — N2581 Secondary hyperparathyroidism of renal origin: Secondary | ICD-10-CM | POA: Diagnosis not present

## 2017-09-09 DIAGNOSIS — E1129 Type 2 diabetes mellitus with other diabetic kidney complication: Secondary | ICD-10-CM | POA: Diagnosis not present

## 2017-09-09 DIAGNOSIS — N2581 Secondary hyperparathyroidism of renal origin: Secondary | ICD-10-CM | POA: Diagnosis not present

## 2017-09-09 DIAGNOSIS — N186 End stage renal disease: Secondary | ICD-10-CM | POA: Diagnosis not present

## 2017-09-09 DIAGNOSIS — D631 Anemia in chronic kidney disease: Secondary | ICD-10-CM | POA: Diagnosis not present

## 2017-09-10 ENCOUNTER — Ambulatory Visit
Admission: RE | Admit: 2017-09-10 | Discharge: 2017-09-10 | Disposition: A | Discharge status: Home / Self Care | Payer: Medicare Other | Source: Ambulatory Visit | Attending: Internal Medicine | Admitting: Internal Medicine

## 2017-09-10 VITALS — BP 150/59 | HR 67 | Resp 10

## 2017-09-10 DIAGNOSIS — S32030A Wedge compression fracture of third lumbar vertebra, initial encounter for closed fracture: Secondary | ICD-10-CM

## 2017-09-10 DIAGNOSIS — S32030G Wedge compression fracture of third lumbar vertebra, subsequent encounter for fracture with delayed healing: Secondary | ICD-10-CM

## 2017-09-10 MED ORDER — SODIUM CHLORIDE 0.9 % IV SOLN
Freq: Once | INTRAVENOUS | Status: AC
  Administered 2017-09-10: 11:00:00 via INTRAVENOUS

## 2017-09-10 MED ORDER — MIDAZOLAM HCL 2 MG/2ML IJ SOLN
1.0000 mg | INTRAMUSCULAR | Status: DC | PRN
Start: 2017-09-10 — End: 2017-09-11
  Administered 2017-09-10: 0.5 mg via INTRAVENOUS

## 2017-09-10 MED ORDER — FENTANYL CITRATE (PF) 100 MCG/2ML IJ SOLN
25.0000 ug | INTRAMUSCULAR | Status: DC | PRN
Start: 2017-09-10 — End: 2017-09-11
  Administered 2017-09-10: 25 ug via INTRAVENOUS

## 2017-09-10 MED ORDER — VANCOMYCIN HCL IN DEXTROSE 1-5 GM/200ML-% IV SOLN
1000.0000 mg | Freq: Once | INTRAVENOUS | Status: AC
  Administered 2017-09-10: 1000 mg via INTRAVENOUS

## 2017-09-10 MED ORDER — KETOROLAC TROMETHAMINE 30 MG/ML IJ SOLN
30.0000 mg | Freq: Once | INTRAMUSCULAR | Status: AC
Start: 2017-09-10 — End: 2017-09-10
  Administered 2017-09-10: 30 mg via INTRAVENOUS

## 2017-09-10 NOTE — Discharge Instructions (Signed)
Vertebroplasty Post Procedure Discharge Instructions ° °1. May resume a regular diet and any medications that you routinely take (including pain medications). °2. No driving day of procedure. °3. Upon discharge go home and rest for at least 4 hours.  May use an ice pack as needed to injection sites on back.  Ice to back 30 minutes on and 30 minutes off, all day. °4. May remove bandaids tomorrow after taking a shower. Replace daily with clean bandaid until healed. °5. Do not lift anything heavier than a milk jug. °6. Follow up with your attending physician in 2 weeks. ° ° ° °Please contact our office at [phone] for the following symptoms: ° °· Fever greater than 100 degrees °· Increased swelling, pain, or redness at injection site. ° ° °Thank you for visiting [facility]. °

## 2017-09-11 ENCOUNTER — Ambulatory Visit: Payer: Medicare Other | Admitting: Internal Medicine

## 2017-09-11 DIAGNOSIS — N186 End stage renal disease: Secondary | ICD-10-CM | POA: Diagnosis not present

## 2017-09-11 DIAGNOSIS — N2581 Secondary hyperparathyroidism of renal origin: Secondary | ICD-10-CM | POA: Diagnosis not present

## 2017-09-11 DIAGNOSIS — D631 Anemia in chronic kidney disease: Secondary | ICD-10-CM | POA: Diagnosis not present

## 2017-09-11 DIAGNOSIS — E1129 Type 2 diabetes mellitus with other diabetic kidney complication: Secondary | ICD-10-CM | POA: Diagnosis not present

## 2017-09-12 ENCOUNTER — Ambulatory Visit (INDEPENDENT_AMBULATORY_CARE_PROVIDER_SITE_OTHER): Payer: Medicare Other | Admitting: Endocrinology

## 2017-09-12 ENCOUNTER — Encounter: Payer: Self-pay | Admitting: Endocrinology

## 2017-09-12 VITALS — BP 132/82 | HR 73 | Ht 71.0 in | Wt 195.8 lb

## 2017-09-12 DIAGNOSIS — E1165 Type 2 diabetes mellitus with hyperglycemia: Secondary | ICD-10-CM | POA: Diagnosis not present

## 2017-09-12 DIAGNOSIS — Z794 Long term (current) use of insulin: Secondary | ICD-10-CM

## 2017-09-12 DIAGNOSIS — I779 Disorder of arteries and arterioles, unspecified: Secondary | ICD-10-CM | POA: Diagnosis not present

## 2017-09-12 NOTE — Patient Instructions (Signed)
adjust Lantus only if fasting blood sugars are consistently under 90 or over 130  Check blood sugars on waking up  3/7  Also check blood sugars about 2 hours after a meal and do this after different meals by rotation  Recommended blood sugar levels on waking up is 90-130 and about 2 hours after meal is 130-160  Please bring your blood sugar monitor to each visit, thank you

## 2017-09-12 NOTE — Progress Notes (Signed)
Patient ID: [name], male   DOB: 06-May-1941, 76 y.o.   MRN: [id number]           Reason for Appointment:  Follow-up for Type 2 Diabetes  Referring physician: [place]  History of Present Illness:          Date of diagnosis of type 2 diabetes mellitus:  1996      Background history:  He has had long-standing diabetes probably treated with metformin initially and subsequently with sulfonylurea drugs At some point he was also given Actos in addition to his glipizide which he is still taking Records of his control are only available for the last 4 years Over the last few years his A1c has been generally in the 6-7% range His A1c was 6.2 in May 2016 and apparently was starting to get low normal blood sugars at times He was admitted to the hospital for fluid overload and renal failure and at that time his Actos was stopped Subsequently his blood sugars had been markedly increased He was switched from regular insulin to NovoLog on his initial consultation  Recent history:    His A1c is better at 7.7 compared to 8.6 previously  INSULIN regimen is described as: Lantus 11 units in am; 10--5--10  units Novolog with meals with syringe  Dialysis is done at 7 am, doing it on Monday/Wednesday/Fridays     Current blood sugar patterns and problems identified:  He did  bring his monitor for download but it has probably incorrect date programmed and only 7 readings in the last month  Also he has not been checking her sugars regularly because of having other medical issues  Despite his eating somewhat less overall his blood sugars have not been relatively low compared to before and continuing the same doses of insulin  The last few readings are fairly good between what 1 10-140, he did have one reading of 213 before lunch  He has seen the dietitian and is making changes  Especially at dialysis he is cutting back on snacks and sweets  Also is trying to take NovoLog 3 times a day before  meals as instructed  He is taking 11 units of Lantus but appears to arbitrarily change the dose on his own   Oral hypoglycemic drugs the patient is taking are:  on Actos 30 mg daily in a.m.     Side effects from medications have been: None  Compliance with the medical regimen: Good   Glucose monitoring:  done 0-2  times a day         Glucometer:  Freestyle  Blood Glucose readings by recall as above    Self-care: The diet that the patient has been following is: tries to limit sweets, portions. eating out only about once a week at a steak house        Typical meal intake: Breakfast is  Protein shake/banana; supper 8 pm            Dietician visit, most recent: 08/2017               Exercise: some walking At times, recently none  Weight history:  Wt Readings from Last 3 Encounters:  09/12/17 195 lb 12.8 oz (88.8 kg)  08/29/17 196 lb (88.9 kg)  08/14/17 200 lb (90.7 kg)    Glycemic control:    Lab Results  Component Value Date   HGBA1C 7.7 (H) 09/05/2017   HGBA1C 8.6 (H) 05/09/2017   HGBA1C 7.4 (H) 03/08/2017  Lab Results  Component Value Date   MICROALBUR 37.2 09/03/2016   LDLCALC 21 09/05/2017   CREATININE 5.58 (HH) 05/09/2017    Lab Results  Component Value Date   FRUCTOSAMINE 454 (H) 07/12/2017   FRUCTOSAMINE 355 (H) 10/08/2016   FRUCTOSAMINE 407 (H) 08/29/2016      Allergies as of 09/12/2017      Reactions   Penicillins Rash   Has patient had a PCN reaction causing immediate rash, facial/tongue/throat swelling, SOB or lightheadedness with hypotension: Yes Has patient had a PCN reaction causing severe rash involving mucus membranes or skin necrosis: Yes Has patient had a PCN reaction that required hospitalization: No Has patient had a PCN reaction occurring within the last 10 years: No If all of the above answers are "NO", then may proceed with Cephalosporin use.      Medication List        Accurate as of 09/12/17  3:05 PM. Always use your most  recent med list.          acetaminophen 500 MG tablet Commonly known as:  TYLENOL Take 500 mg by mouth every 6 (six) hours as needed (for pain).   acetaminophen-codeine 300-30 MG tablet Commonly known as:  TYLENOL #3 take 1 tablet by mouth every 4 hours if needed for pain   allopurinol 100 MG tablet Commonly known as:  ZYLOPRIM Take 100 mg by mouth daily.   aspirin 325 MG tablet Take 650 mg by mouth daily.   azithromycin 250 MG tablet Commonly known as:  ZITHROMAX Z-PAK One po daily x 5 days   clobetasol 0.05 % external solution Commonly known as:  TEMOVATE apply to affected area ON THE SCALP UP TO 2 TIMES A DAY AS NEEDED...  (REFER TO PRESCRIPTION NOTES).   donepezil 10 MG tablet Commonly known as:  ARICEPT Take 1 tablet (10 mg total) by mouth at bedtime.   Fluocinolone Acetonide Scalp 0.01 % Oil apply to affected area ON SCALP EVERY NIGHT AT BEDTIME, WASH UPON AWAKENING   fluticasone 0.05 % cream Commonly known as:  CUTIVATE apply to affected area ON FACE UP TO 2 TIMES A DAY AS NEEDED   fluticasone 50 MCG/ACT nasal spray Commonly known as:  FLONASE Place 1 spray into both nostrils daily.   FREESTYLE FREEDOM LITE w/Device Kit Use to check blood sugar 2 times per day dx code E11.65   freestyle lancets Use as instructed to check blood sugar 2 times per day dx code E11.65   glucose blood test strip Commonly known as:  ONETOUCH VERIO Use to check blood sugar 2 times per day. Dx code E11.65   insulin aspart 100 UNIT/ML injection Commonly known as:  novoLOG Inject 10 Units into the skin 3 (three) times daily before meals.   insulin glargine 100 UNIT/ML injection Commonly known as:  LANTUS Inject 0.08 mLs (8 Units total) into the skin every morning.   Insulin Pen Needle 30G X 8 MM Misc Commonly known as:  NOVOFINE Use to inject insulin 3 times daily   ketoconazole 2 % shampoo Commonly known as:  NIZORAL Apply 1 application topically 2 (two) times a week.    levothyroxine 50 MCG tablet Commonly known as:  SYNTHROID, LEVOTHROID Take 1 tablet (50 mcg total) by mouth daily.   lidocaine-prilocaine cream Commonly known as:  EMLA Apply 1 application topically once. AS DIRECTED   loratadine 10 MG tablet Commonly known as:  CLARITIN Take 10 mg by mouth daily as needed for allergies.  metroNIDAZOLE 0.75 % gel Commonly known as:  METROGEL APPLY TO AFFECTED AREA ON SKIN  ON FACE TWICE DAILY   mupirocin ointment 2 % Commonly known as:  BACTROBAN Use as directed to affected areas   Naftifine HCl 2 % Crea Commonly known as:  NAFTIN Apply to feet daily for fungal infection   pioglitazone 30 MG tablet Commonly known as:  ACTOS take 1 tablet by mouth once daily   promethazine 12.5 MG tablet Commonly known as:  PHENERGAN Take 1 tablet (12.5 mg total) by mouth every 12 (twelve) hours as needed for nausea or vomiting.   RENVELA 800 MG tablet Generic drug:  sevelamer carbonate Take 800-2,400 mg by mouth See admin instructions. 1,600-2,400 mg with breakfast then 1,600 mg with lunch then 2,400 mg with dinner (evening meal) and 800 mg with each snack   SENSIPAR 30 MG tablet Generic drug:  cinacalcet Take 30 mg by mouth daily.   simvastatin 20 MG tablet Commonly known as:  ZOCOR Take 20 mg by mouth at bedtime.   traMADol 50 MG tablet Commonly known as:  ULTRAM Take 1 tablet (50 mg total) by mouth every 12 (twelve) hours as needed.   triamcinolone ointment 0.1 % Commonly known as:  KENALOG Apply 1 application topically 2 (two) times daily. Use between thighs       Allergies:  Allergies  Allergen Reactions  . Penicillins Rash    Has patient had a PCN reaction causing immediate rash, facial/tongue/throat swelling, SOB or lightheadedness with hypotension: Yes Has patient had a PCN reaction causing severe rash involving mucus membranes or skin necrosis: Yes Has patient had a PCN reaction that required hospitalization: No Has patient  had a PCN reaction occurring within the last 10 years: No If all of the above answers are "NO", then may proceed with Cephalosporin use.     Past Medical History:  Diagnosis Date  . Allergy   . Anemia   . Arthritis   . Coronary artery disease   . Diabetes mellitus    Type 2  . Diverticulitis   . ED (erectile dysfunction)   . 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 ESRD (end stage renal disease) on dialysis ([place]) 03/2015  . GERD (gastroesophageal reflux disease)    pepto   . Gout   . Hyperlipidemia   . Hypertension   . Hypothyroidism   . Pneumonia   . PVD (peripheral vascular disease) ([place])    has plastic aorta  . Renal insufficiency   . Seasonal allergies   . Shortness of breath dyspnea   . Sleep apnea   . Thyroid disease     Past Surgical History:  Procedure Laterality Date  . aortobifemoral bypass    . AV FISTULA PLACEMENT Left 12/01/2013   Procedure: ARTERIOVENOUS (AV) FISTULA CREATION- LEFT BRACHIOCEPHALIC;  Surgeon: Angelia Mould, MD;  Location: [facility];  Service: Vascular;  Laterality: Left;  . [place] TRANSPOSITION Right 07/27/2014   Procedure: BASCILIC VEIN TRANSPOSITION;  Surgeon: Angelia Mould, MD;  Location: [place] [facility];  Service: Vascular;  Laterality: Right;  . BREAST SURGERY     left - granulomatous mastitis  . COLONOSCOPY    . 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 EYE SURGERY Bilateral    cataracts  . REVISON OF ARTERIOVENOUS FISTULA Left 02/09/2014   Procedure: REVISON OF LEFT ARTERIOVENOUS FISTULA - RESECTION OF RENDUNDANT VEIN;  Surgeon: Angelia Mould, MD;  Location: [facility];  Service: Vascular;  Laterality: Left;  . SBO with lysis adhesions    . SHUNTOGRAM Left 04/19/2014   Procedure: FISTULOGRAM;  Surgeon: Angelia Mould, MD;  Location[facility] CATH LAB;  Service: Cardiovascular;  Laterality: Left;  . UNILATERAL UPPER EXTREMEITY ANGIOGRAM N/A 07/12/2014   Procedure: UNILATERAL UPPER Anselmo Rod;  Surgeon:  Angelia Mould, MD;  Location: [facility] CATH LAB;  Service: Cardiovascular;  Laterality: N/A;    Family History  Problem Relation Age of Onset  . Aneurysm Mother   . Heart disease Father   . Stroke Father   . Hypertension Father   . Diabetes Father   . Dementia Neg Hx     Social History:  reports that he quit smoking about 22 years ago. he has never used smokeless tobacco. He reports that he does not drink alcohol or use drugs.    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  Latest known visit with results is:  Lab on 09/05/2017  Component Date Value Ref Range Status  . ALT 09/05/2017 10  0 - 53 U/L Final  . Glucose, Bld 09/05/2017 166* 70 - 99 mg/dL Final  . Cholesterol 09/05/2017 79  0 - 200 mg/dL Final   ATP III Classification       Desirable:  < 200 mg/dL               Borderline High:  200 - 239 mg/dL          High:  > = 240 mg/dL  . Triglycerides 09/05/2017 99.0  0.0 - 149.0 mg/dL Final   Normal:  <150 mg/dLBorderline High:  150 - 199 mg/dL  . HDL 09/05/2017 38.00* >39.00 mg/dL Final  . VLDL 09/05/2017 19.8  0.0 - 40.0 mg/dL Final  . LDL Cholesterol 09/05/2017 21  0 - 99 mg/dL Final  . Total CHOL/HDL Ratio 09/05/2017 2   Final                  Men          Women1/2 Average Risk     3.4          3.3Average Risk          5.0          4.42X  Average Risk          9.6          7.13X Average Risk          15.0          11.0                      . NonHDL 09/05/2017 40.98   Final   NOTE:  Non-HDL goal should be 30  mg/dL higher than patient's LDL goal (i.e. LDL goal of < 70 mg/dL, would have non-HDL goal of < 100 mg/dL)  . Hgb A1c MFr Bld 09/05/2017 7.7* 4.6 - 6.5 % Final   Glycemic Control Guidelines for People with Diabetes:Non Diabetic:  <6%Goal of Therapy: <7%Additional Action Suggested:  >8%     Physical Examination:  BP 132/82   Pulse 73   Ht _0  (1.803 m)   Wt 195 lb 12.8 oz (88.8 kg)   SpO2 98%   BMI 27.31 kg/m       ASSESSMENT:  Diabetes type 2 on insulin See history of present illness for detailed discussion of his current management, blood sugar patterns and problems identified  His A1c is 7.7  His blood sugars appear to be somewhat better control This is likely to be from his improving his diet with cutting back on snacks especially while dialysis Also benefiting from consultation with dietitian Since he did not check his blood sugars much lately difficult to know what his blood sugar patterns are and cannot make any adjustments in his insulin regimen Appears to have relatively good readings in the last few days both fasting and midday but only once high at noon  Still needing relatively low doses of basal insulin with relatively good fasting readings   PLAN:  He needs to start checking his blood sugars at least once a day by rotation at different times Discussed timing of glucose monitoring and blood sugar targets Also advised him that if his sugars are getting higher after lunch he needs to go up at least 2-4 units for his lunchtime NovoLog Also he will not change his Lantus unless morning sugars are out of range Follow-up in 3 months  He probably should have a bone density done by his PCP to evaluate for osteoporosis He can also discuss this with his nephrologist  Patient Instructions  adjust  Lantus only if fasting blood sugars are consistently under 90 or over 130  Check blood sugars on waking up  3/7  Also check blood sugars about 2 hours after a meal and do this after different meals by rotation  Recommended blood sugar levels on waking up is 90-130 and about 2 hours after meal is 130-160  Please bring your blood sugar monitor to each visit, thank you          Elayne Snare 09/12/2017, 3:05 PM   Note: This office note was prepared with Dragon voice recognition system technology. Any transcriptional errors that result from this process are unintentional.

## 2017-09-13 ENCOUNTER — Other Ambulatory Visit: Payer: Self-pay | Admitting: Internal Medicine

## 2017-09-13 DIAGNOSIS — D631 Anemia in chronic kidney disease: Secondary | ICD-10-CM | POA: Diagnosis not present

## 2017-09-13 DIAGNOSIS — R531 Weakness: Secondary | ICD-10-CM

## 2017-09-13 DIAGNOSIS — E1129 Type 2 diabetes mellitus with other diabetic kidney complication: Secondary | ICD-10-CM | POA: Diagnosis not present

## 2017-09-13 DIAGNOSIS — N2581 Secondary hyperparathyroidism of renal origin: Secondary | ICD-10-CM | POA: Diagnosis not present

## 2017-09-13 DIAGNOSIS — D539 Nutritional anemia, unspecified: Secondary | ICD-10-CM

## 2017-09-13 DIAGNOSIS — N186 End stage renal disease: Secondary | ICD-10-CM | POA: Diagnosis not present

## 2017-09-15 DIAGNOSIS — N186 End stage renal disease: Secondary | ICD-10-CM | POA: Diagnosis not present

## 2017-09-15 DIAGNOSIS — D631 Anemia in chronic kidney disease: Secondary | ICD-10-CM | POA: Diagnosis not present

## 2017-09-15 DIAGNOSIS — E1129 Type 2 diabetes mellitus with other diabetic kidney complication: Secondary | ICD-10-CM | POA: Diagnosis not present

## 2017-09-15 DIAGNOSIS — N2581 Secondary hyperparathyroidism of renal origin: Secondary | ICD-10-CM | POA: Diagnosis not present

## 2017-09-18 DIAGNOSIS — N2581 Secondary hyperparathyroidism of renal origin: Secondary | ICD-10-CM | POA: Diagnosis not present

## 2017-09-18 DIAGNOSIS — E1129 Type 2 diabetes mellitus with other diabetic kidney complication: Secondary | ICD-10-CM | POA: Diagnosis not present

## 2017-09-18 DIAGNOSIS — N186 End stage renal disease: Secondary | ICD-10-CM | POA: Diagnosis not present

## 2017-09-18 DIAGNOSIS — D631 Anemia in chronic kidney disease: Secondary | ICD-10-CM | POA: Diagnosis not present

## 2017-09-19 ENCOUNTER — Ambulatory Visit: Payer: Medicare Other | Admitting: Internal Medicine

## 2017-09-19 DIAGNOSIS — Z992 Dependence on renal dialysis: Secondary | ICD-10-CM | POA: Diagnosis not present

## 2017-09-19 DIAGNOSIS — N186 End stage renal disease: Secondary | ICD-10-CM | POA: Diagnosis not present

## 2017-09-19 DIAGNOSIS — I871 Compression of vein: Secondary | ICD-10-CM | POA: Diagnosis not present

## 2017-09-20 DIAGNOSIS — N186 End stage renal disease: Secondary | ICD-10-CM | POA: Diagnosis not present

## 2017-09-20 DIAGNOSIS — N2581 Secondary hyperparathyroidism of renal origin: Secondary | ICD-10-CM | POA: Diagnosis not present

## 2017-09-20 DIAGNOSIS — D631 Anemia in chronic kidney disease: Secondary | ICD-10-CM | POA: Diagnosis not present

## 2017-09-20 DIAGNOSIS — E1129 Type 2 diabetes mellitus with other diabetic kidney complication: Secondary | ICD-10-CM | POA: Diagnosis not present

## 2017-09-22 DIAGNOSIS — N2581 Secondary hyperparathyroidism of renal origin: Secondary | ICD-10-CM | POA: Diagnosis not present

## 2017-09-22 DIAGNOSIS — E1129 Type 2 diabetes mellitus with other diabetic kidney complication: Secondary | ICD-10-CM | POA: Diagnosis not present

## 2017-09-22 DIAGNOSIS — N186 End stage renal disease: Secondary | ICD-10-CM | POA: Diagnosis not present

## 2017-09-22 DIAGNOSIS — D631 Anemia in chronic kidney disease: Secondary | ICD-10-CM | POA: Diagnosis not present

## 2017-09-23 DIAGNOSIS — N186 End stage renal disease: Secondary | ICD-10-CM | POA: Diagnosis not present

## 2017-09-23 DIAGNOSIS — E1122 Type 2 diabetes mellitus with diabetic chronic kidney disease: Secondary | ICD-10-CM | POA: Diagnosis not present

## 2017-09-23 DIAGNOSIS — Z992 Dependence on renal dialysis: Secondary | ICD-10-CM | POA: Diagnosis not present

## 2017-09-25 ENCOUNTER — Encounter: Payer: Medicare Other | Attending: Internal Medicine | Admitting: Internal Medicine

## 2017-09-25 DIAGNOSIS — X58XXXS Exposure to other specified factors, sequela: Secondary | ICD-10-CM | POA: Insufficient documentation

## 2017-09-25 DIAGNOSIS — E1129 Type 2 diabetes mellitus with other diabetic kidney complication: Secondary | ICD-10-CM | POA: Diagnosis not present

## 2017-09-25 DIAGNOSIS — Z992 Dependence on renal dialysis: Secondary | ICD-10-CM | POA: Insufficient documentation

## 2017-09-25 DIAGNOSIS — N186 End stage renal disease: Secondary | ICD-10-CM | POA: Diagnosis not present

## 2017-09-25 DIAGNOSIS — S31102S Unspecified open wound of abdominal wall, epigastric region without penetration into peritoneal cavity, sequela: Secondary | ICD-10-CM | POA: Insufficient documentation

## 2017-09-25 DIAGNOSIS — I120 Hypertensive chronic kidney disease with stage 5 chronic kidney disease or end stage renal disease: Secondary | ICD-10-CM | POA: Insufficient documentation

## 2017-09-25 DIAGNOSIS — E1122 Type 2 diabetes mellitus with diabetic chronic kidney disease: Secondary | ICD-10-CM | POA: Diagnosis not present

## 2017-09-25 DIAGNOSIS — D509 Iron deficiency anemia, unspecified: Secondary | ICD-10-CM | POA: Diagnosis not present

## 2017-09-25 DIAGNOSIS — Z87891 Personal history of nicotine dependence: Secondary | ICD-10-CM | POA: Diagnosis not present

## 2017-09-25 DIAGNOSIS — S31109A Unspecified open wound of abdominal wall, unspecified quadrant without penetration into peritoneal cavity, initial encounter: Secondary | ICD-10-CM | POA: Diagnosis not present

## 2017-09-25 DIAGNOSIS — N2581 Secondary hyperparathyroidism of renal origin: Secondary | ICD-10-CM | POA: Diagnosis not present

## 2017-09-25 DIAGNOSIS — E11622 Type 2 diabetes mellitus with other skin ulcer: Secondary | ICD-10-CM | POA: Diagnosis not present

## 2017-09-25 DIAGNOSIS — D631 Anemia in chronic kidney disease: Secondary | ICD-10-CM | POA: Diagnosis not present

## 2017-09-26 ENCOUNTER — Encounter: Payer: Self-pay | Admitting: Internal Medicine

## 2017-09-26 ENCOUNTER — Ambulatory Visit (INDEPENDENT_AMBULATORY_CARE_PROVIDER_SITE_OTHER): Payer: Medicare Other | Admitting: Internal Medicine

## 2017-09-26 VITALS — BP 130/70 | HR 80 | Temp 98.0°F | Ht 71.5 in | Wt 195.0 lb

## 2017-09-26 DIAGNOSIS — R1031 Right lower quadrant pain: Secondary | ICD-10-CM

## 2017-09-26 DIAGNOSIS — E538 Deficiency of other specified B group vitamins: Secondary | ICD-10-CM | POA: Diagnosis not present

## 2017-09-26 DIAGNOSIS — Z992 Dependence on renal dialysis: Secondary | ICD-10-CM | POA: Diagnosis not present

## 2017-09-26 DIAGNOSIS — R1032 Left lower quadrant pain: Secondary | ICD-10-CM

## 2017-09-26 DIAGNOSIS — S22000A Wedge compression fracture of unspecified thoracic vertebra, initial encounter for closed fracture: Secondary | ICD-10-CM | POA: Diagnosis not present

## 2017-09-26 DIAGNOSIS — D7589 Other specified diseases of blood and blood-forming organs: Secondary | ICD-10-CM

## 2017-09-26 DIAGNOSIS — R531 Weakness: Secondary | ICD-10-CM | POA: Diagnosis not present

## 2017-09-26 DIAGNOSIS — D539 Nutritional anemia, unspecified: Secondary | ICD-10-CM | POA: Diagnosis not present

## 2017-09-26 DIAGNOSIS — N186 End stage renal disease: Secondary | ICD-10-CM | POA: Diagnosis not present

## 2017-09-26 LAB — CBC WITH DIFFERENTIAL/PLATELET
Basophils Absolute: 70 cells/uL (ref 0–200)
Basophils Relative: 0.7 %
Eosinophils Absolute: 280 cells/uL (ref 15–500)
Eosinophils Relative: 2.8 %
HCT: 32.4 % — ABNORMAL LOW (ref 38.5–50.0)
Hemoglobin: 10.9 g/dL — ABNORMAL LOW (ref 13.2–17.1)
Lymphs Abs: 2440 cells/uL (ref 850–3900)
MCH: 35.5 pg — ABNORMAL HIGH (ref 27.0–33.0)
MCHC: 33.6 g/dL (ref 32.0–36.0)
MCV: 105.5 fL — ABNORMAL HIGH (ref 80.0–100.0)
MPV: 10 fL (ref 7.5–12.5)
Monocytes Relative: 8.2 %
Neutro Abs: 6390 cells/uL (ref 1500–7800)
Neutrophils Relative %: 63.9 %
Platelets: 201 10*3/uL (ref 140–400)
RBC: 3.07 10*6/uL — ABNORMAL LOW (ref 4.20–5.80)
RDW: 15.3 % — ABNORMAL HIGH (ref 11.0–15.0)
Total Lymphocyte: 24.4 %
WBC mixed population: 820 cells/uL (ref 200–950)
WBC: 10 10*3/uL (ref 3.8–10.8)

## 2017-09-26 LAB — VITAMIN B12: Vitamin B-12: 963 pg/mL (ref 200–1100)

## 2017-09-26 LAB — FOLATE: Folate: >24 ng/mL

## 2017-09-26 MED ORDER — CYANOCOBALAMIN 1000 MCG/ML IJ SOLN
1000.0000 ug | Freq: Once | INTRAMUSCULAR | Status: AC
  Administered 2017-09-26: 1000 ug via INTRAMUSCULAR

## 2017-09-26 MED ORDER — NYSTATIN-TRIAMCINOLONE 100000-0.1 UNIT/GM-% EX CREA: 1.0000 "application " | TOPICAL_CREAM | Freq: Two times a day (BID) | CUTANEOUS | 0 refills | Status: DC

## 2017-09-26 NOTE — Patient Instructions (Signed)
To have CT without contrast of the abdomen for evaluation of lower abdominal pain.  To have a bone density study status post compression fracture.  Follow-up next week.  B12 injection given.  Labs pending.

## 2017-09-26 NOTE — Progress Notes (Signed)
   Subjective:    Patient ID: [name], male    DOB: Jul 13, 1941, 77 y.o.   MRN: [id number]  HPI  77 year old Male with multiple medical issues for health maintenance exam and evaluation of medical    Review of Systems     Objective:   Physical Exam        Assessment & Plan:

## 2017-09-26 NOTE — Progress Notes (Signed)
[name] ([id number]) Visit Report for 09/25/2017 Arrival Information Details Patient Name: [name]. Date of Service: 09/25/2017 3:15 PM Medical Record Number: [id number] Patient Account Number: [id number] Date of Birth/Sex: 1941-08-12 (77 y.o. Male) Treating RN: Montey Hora Primary Care Khasir Woodrome: Tedra Senegal Other Clinician: Referring Horris Speros: Tedra Senegal Treating Abas Leicht/Extender: Tito Dine in Treatment: 4 Visit Information History Since Last Visit Added or deleted any medications: No Patient Arrived: Ambulatory Any new allergies or adverse reactions: No Arrival Time: 15:22 Had a fall or experienced change in No Accompanied By: self activities of daily living that may affect Transfer Assistance: None risk of falls: Patient Identification Verified: Yes Signs or symptoms of abuse/neglect since last visito No Secondary Verification Process Yes Hospitalized since last visit: No Completed: Has Dressing in Place as Prescribed: Yes Patient Has Alerts: Yes Pain Present Now: No Patient Alerts: Patient on Blood Thinner DMII aspirin 650 Electronic Signature(s) Signed: 09/25/2017 5:10:34 PM By: Montey Hora Entered By: Montey Hora on 09/25/2017 15:22:47 [name] ([id number]) -------------------------------------------------------------------------------- Clinic Level of Care Assessment Details Patient Name: [name]. Date of Service: 09/25/2017 3:15 PM Medical Record Number: [id number] Patient Account Number: [id number] Date of Birth/Sex: [birth year] (77 y.o. Male) Treating RN: Montey Hora Primary Care Jasmin Trumbull: Tedra Senegal Other Clinician: Referring Tien Spooner: Tedra Senegal Treating Berdie Malter/Extender: Tito Dine in Treatment: 4 Clinic Level of Care Assessment Items TOOL 4 Quantity Score []  - Use when only an EandM is performed on FOLLOW-UP visit 0 ASSESSMENTS - Nursing Assessment / Reassessment X - Reassessment of  Co-morbidities (includes updates in patient status) 1 10 X- 1 5 Reassessment of Adherence to Treatment Plan ASSESSMENTS - Wound and Skin Assessment / Reassessment X - Simple Wound Assessment / Reassessment - one wound 1 5 []  - 0 Complex Wound Assessment / Reassessment - multiple wounds []  - 0 Dermatologic / Skin Assessment (not related to wound area) ASSESSMENTS - Focused Assessment []  - Circumferential Edema Measurements - multi extremities 0 []  - 0 Nutritional Assessment / Counseling / Intervention []  - 0 Lower Extremity Assessment (monofilament, tuning fork, pulses) []  - 0 Peripheral Arterial Disease Assessment (using hand held doppler) ASSESSMENTS - Ostomy and/or Continence Assessment and Care []  - Incontinence Assessment and Management 0 []  - 0 Ostomy Care Assessment and Management (repouching, etc.) PROCESS - Coordination of Care X - Simple Patient / Family Education for ongoing care 1 15 []  - 0 Complex (extensive) Patient / Family Education for ongoing care []  - 0 Staff obtains Programmer, systems, Records, Test Results / Process Orders []  - 0 Staff telephones HHA, Nursing Homes / Clarify orders / etc []  - 0 Routine Transfer to another Facility (non-emergent condition) []  - 0 Routine Hospital Admission (non-emergent condition) []  - 0 New Admissions / Biomedical engineer / Ordering NPWT, Apligraf, etc. []  - 0 Emergency Hospital Admission (emergent condition) X- 1 10 Simple Discharge Coordination [name] ([id number]) []  - 0 Complex (extensive) Discharge Coordination PROCESS - Special Needs []  - Pediatric / Minor Patient Management 0 []  - 0 Isolation Patient Management []  - 0 Hearing / Language / Visual special needs []  - 0 Assessment of Community assistance (transportation, D/C planning, etc.) []  - 0 Additional assistance / Altered mentation []  - 0 Support Surface(s) Assessment (bed, cushion, seat, etc.) INTERVENTIONS - Wound Cleansing / Measurement X -  Simple Wound Cleansing - one wound 1 5 []  - 0 Complex Wound Cleansing - multiple wounds X- 1 5 Wound Imaging (photographs - any number of wounds) []  -  0 Wound Tracing (instead of photographs) X- 1 5 Simple Wound Measurement - one wound []  - 0 Complex Wound Measurement - multiple wounds INTERVENTIONS - Wound Dressings []  - Small Wound Dressing one or multiple wounds 0 []  - 0 Medium Wound Dressing one or multiple wounds []  - 0 Large Wound Dressing one or multiple wounds []  - 0 Application of Medications - topical []  - 0 Application of Medications - injection INTERVENTIONS - Miscellaneous []  - External ear exam 0 []  - 0 Specimen Collection (cultures, biopsies, blood, body fluids, etc.) []  - 0 Specimen(s) / Culture(s) sent or taken to Lab for analysis []  - 0 Patient Transfer (multiple staff / Civil Service fast streamer / Similar devices) []  - 0 Simple Staple / Suture removal (25 or less) []  - 0 Complex Staple / Suture removal (26 or more) []  - 0 Hypo / Hyperglycemic Management (close monitor of Blood Glucose) []  - 0 Ankle / Brachial Index (ABI) - do not check if billed separately X- 1 5 Vital Signs [name] ([id number]) Has the patient been seen at the hospital within the last three years: Yes Total Score: 65 Level Of Care: New/Established - Level 2 Electronic Signature(s) Signed: 09/25/2017 5:10:34 PM By: Montey Hora Entered By: Montey Hora on 09/25/2017 16:01:38 [name] ([id number]) -------------------------------------------------------------------------------- Encounter Discharge Information Details Patient Name: [name]. Date of Service: 09/25/2017 3:15 PM Medical Record Number: [id number] Patient Account Number: [id number] Date of Birth/Sex: 1941-01-18 (77 y.o. Male) Treating RN: Montey Hora Primary Care Selinda Korzeniewski: Tedra Senegal Other Clinician: Referring Nephi Savage: Tedra Senegal Treating Owin Vignola/Extender: Tito Dine in Treatment:  4 Encounter Discharge Information Items Discharge Pain Level: 0 Discharge Condition: Stable Ambulatory Status: Ambulatory Discharge Destination: Home Transportation: Private Auto Accompanied By: self Schedule Follow-up Appointment: No Medication Reconciliation completed and No provided to Patient/Care Skarlett Sedlacek: Provided on Clinical Summary of Care: 09/25/2017 Form Type Recipient Paper Patient WT Electronic Signature(s) Signed: 09/25/2017 4:01:59 PM By: Montey Hora Entered By: Montey Hora on 09/25/2017 16:01:59 Vi, Talmage [name] ([id number]) -------------------------------------------------------------------------------- Multi Wound Chart Details Patient Name: [name]. Date of Service: 09/25/2017 3:15 PM Medical Record Number: [id number] Patient Account Number: [id number] Date of Birth/Sex: [birth year] (77 y.o. Male) Treating RN: Montey Hora Primary Care Martrice Apt: Tedra Senegal Other Clinician: Referring Randal Yepiz: Tedra Senegal Treating Miah Boye/Extender: Tito Dine in Treatment: 4 Vital Signs Height(in): 72 Pulse(bpm): 111 Weight(lbs): 201 Blood Pressure(mmHg): 91/56 Body Mass Index(BMI): 27 Temperature(F): 98.2 Respiratory Rate 18 (breaths/min): Photos: [N/A:N/A] Wound Location: Abdomen - midline N/A N/A Wounding Event: Pressure Injury N/A N/A Primary Etiology: Pressure Ulcer N/A N/A Date Acquired: 08/13/2017 N/A N/A Weeks of Treatment: 4 N/A N/A Wound Status: Open N/A N/A Measurements L x W x D 0x0x0 N/A N/A (cm) Area (cm) : 0 N/A N/A Volume (cm) : 0 N/A N/A % Reduction in Area: 100.00% N/A N/A % Reduction in Volume: 100.00% N/A N/A Classification: Category/Stage II N/A N/A Periwound Skin Texture: No Abnormalities Noted N/A N/A Periwound Skin Moisture: No Abnormalities Noted N/A N/A Periwound Skin Color: No Abnormalities Noted N/A N/A Tenderness on Palpation: No N/A N/A Treatment Notes Electronic Signature(s) Signed: 09/25/2017  5:48:27 PM By: Linton Ham MD Entered By: Linton Ham on 09/25/2017 17:39:06 Chihuahua, Talmage [name] ([id number]) -------------------------------------------------------------------------------- [place] Details Patient Name: [name]. Date of Service: 09/25/2017 3:15 PM Medical Record Number: [id number] Patient Account Number: [id number] Date of Birth/Sex: 1941/09/20 (77 y.o. Male) Treating RN: Montey Hora Primary Care Dyland Panuco: Tedra Senegal Other Clinician: Referring Ludie Pavlik: Renold Genta  MARY Treating Jermiyah Ricotta/Extender: Ricard Dillon Weeks in Treatment: 4 Active Inactive Electronic Signature(s) Signed: 09/25/2017 5:10:34 PM By: Montey Hora Entered By: Montey Hora on 09/25/2017 15:44:58 Anguiano, Talmage [name] ([id number]) -------------------------------------------------------------------------------- Pain Assessment Details Patient Name: [name]. Date of Service: 09/25/2017 3:15 PM Medical Record Number: [id number] Patient Account Number: [id number] Date of Birth/Sex: 11-05-1940 (77 y.o. Male) Treating RN: Montey Hora Primary Care Quinteria Chisum: Tedra Senegal Other Clinician: Referring Hamlin Devine: Tedra Senegal Treating Ashely Joshua/Extender: Tito Dine in Treatment: 4 Active Problems Location of Pain Severity and Description of Pain Patient Has Paino No Site Locations Pain Management and Medication Current Pain Management: Notes Topical or injectable lidocaine is offered to patient for acute pain when surgical debridement is performed. If needed, Patient is instructed to use over the counter pain medication for the following 24-48 hours after debridement. Wound care MDs do not prescribed pain medications. Patient has chronic pain or uncontrolled pain. Patient has been instructed to make an appointment with their Primary Care Physician for pain management. Electronic Signature(s) Signed: 09/25/2017 5:10:34 PM By: Montey Hora Entered By:  Montey Hora on 09/25/2017 15:22:56 Fulghum, Talmage [name] ([id number]) -------------------------------------------------------------------------------- Patient/Caregiver Education Details Patient Name: [name]. Date of Service: 09/25/2017 3:15 PM Medical Record Number: [id number] Patient Account Number: [id number] Date of Birth/Gender: [birth year] (77 y.o. Male) Treating RN: Montey Hora Primary Care Physician: Tedra Senegal Other Clinician: Referring Physician: Tedra Senegal Treating Physician/Extender: Tito Dine in Treatment: 4 Education Assessment Education Provided To: Patient Education Topics Provided Basic Hygiene: Handouts: Other: how to prevent wound reoccurrance Methods: Explain/Verbal Responses: State content correctly Electronic Signature(s) Signed: 09/25/2017 5:10:34 PM By: Montey Hora Entered By: Montey Hora on 09/25/2017 16:02:18 Gell, Talmage [name] ([id number]) -------------------------------------------------------------------------------- Wound Assessment Details Patient Name: [name]. Date of Service: 09/25/2017 3:15 PM Medical Record Number: [id number] Patient Account Number: [id number] Date of Birth/Sex: 12-02-1940 (77 y.o. Male) Treating RN: Montey Hora Primary Care Myesha Stillion: Tedra Senegal Other Clinician: Referring Yuvaan Olander: Tedra Senegal Treating Jarika Robben/Extender: Ricard Dillon Weeks in Treatment: 4 Wound Status Wound Number: 4 Primary Etiology: Pressure Ulcer Wound Location: Abdomen - midline Wound Status: Open Wounding Event: Pressure Injury Date Acquired: 08/13/2017 Weeks Of Treatment: 4 Clustered Wound: No Photos Photo Uploaded By: Montey Hora on 09/25/2017 15:39:51 Wound Measurements Length: (cm) 0 % Width: (cm) 0 % Depth: (cm) 0 Area: (cm) 0 Volume: (cm) 0 Reduction in Area: 100% Reduction in Volume: 100% Wound Description Classification: Category/Stage II Periwound Skin Texture Texture Color No  Abnormalities Noted: No No Abnormalities Noted: No Moisture No Abnormalities Noted: No Electronic Signature(s) Signed: 09/25/2017 5:10:34 PM By: Montey Hora Entered By: Montey Hora on 09/25/2017 15:39:18 Currin, Talmage [name] ([id number]) -------------------------------------------------------------------------------- Vitals Details Patient Name: [name]. Date of Service: 09/25/2017 3:15 PM Medical Record Number: [id number] Patient Account Number: [id number] Date of Birth/Sex: 12-12-1940 (77 y.o. Male) Treating RN: Montey Hora Primary Care Rukiya Hodgkins: Tedra Senegal Other Clinician: Referring Lamberto [name]: Tedra Senegal Treating Thamar Holik/Extender: Tito Dine in Treatment: 4 Vital Signs Time Taken: 15:23 Temperature (F): 98.2 Height (in): 72 Pulse (bpm): 111 Weight (lbs): 201 Respiratory Rate (breaths/min): 18 Body Mass Index (BMI): 27.3 Blood Pressure (mmHg): 91/56 Reference Range: 80 - 120 mg / dl Electronic Signature(s) Signed: 09/25/2017 5:10:34 PM By: Montey Hora Entered By: Montey Hora on 09/25/2017 15:29:31

## 2017-09-26 NOTE — Progress Notes (Signed)
   Subjective:    Patient ID: [name], male    DOB: June 05, 1941, 77 y.o.   MRN: [id number]  HPI Labs drawn and pending including CBC, B12 and folate and follow-up of macrocytosis.  For some reason he quit getting B12 injections.  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  He also had recently lumbar compression fracture and had vertebroplasty.  This occurred after picking up a heavy suitcase.  His back pain is better.  He is having some lower abdominal pain bilaterally.  He has 2 bowel movements a day.  During the time he was being treated for compression fracture he says his  stools were dark but they are now normal color.  He is a dialysis patient.  We are ordering a bone density study for him today.  Dr. Lorrene Reid is his nephrologist.  He sees [name] for mild cognitive impairment and was seen there in November.  He has a history of ischemic ATN following aortobifemoral bypass graft in 1992.  Remote history of diverticulitis.  Small bowel obstruction treated with surgical lysis of adhesions in 1996.  End-stage kidney disease, diabetes mellitus, hypertension, secondary hyperparathyroidism, peripheral vascular disease, history of gout, hyperlipidemia, allergic rhinitis, hypothyroidism.  TSH checked in October was normal.  Dr. Dwyane Dee is his endocrinologist.    Review of Systems see above-complains of shortness of breath at times but denies chest pain     Objective:   Physical Exam Abdomen soft nondistended with multiple scars.  He is tender bilaterally without rebound tenderness in both lower quadrants.  Neck is supple.  Chest clear to auscultation.  Cardiac exam regular rate and rhythm. His weight is 195 pounds.  He is lost about 5 pounds of the past few weeks.      Assessment & Plan:  Status post vertebroplasty for L3 compression fracture-bone density study ordered  Macrocytosis  Bilateral lower abdominal pain-try to get CT approved without contrast  Plan: Follow-up in 1  week.

## 2017-09-26 NOTE — Progress Notes (Signed)
JAYD, CADIEUX ([id number]) Visit Report for 09/25/2017 HPI Details Patient Name: [name], [name]. Date of Service: 09/25/2017 3:15 PM Medical Record Number: [id number] Patient Account Number: [id number] Date of Birth/Sex: 07-27-1941 (77 y.o. Male) Treating RN: Montey Hora Primary Care Provider: Tedra Senegal Other Clinician: Referring Provider: Tedra Senegal Treating Provider/Extender: Ricard Dillon Weeks in Treatment: 4 History of Present Illness Location: periumbilical area. is on the left of the midline Quality: Patient reports No Pain. Severity: Patient states wound (s) are getting better. Duration: Patient has had the wound for < 2 weeks prior to presenting for treatment Context: The wound appeared gradually over time Modifying Factors: Patient is currently on renal dialysis and receives treatments 3 times weekly Associated Signs and Symptoms: Patient reports having:then local care with hydrogen peroxide and Bactroban ointment HPI Description: 77 year old gentleman seen earlier this year in January for a abdominal wound is now back with the same problem which has recurred for about 2 weeks and he has been trying to apply some local antibiotic ointment and a Band-Aid there. Of note he has significant pressure in this area due to his beltline and may have put on some weight which results in his trousers being very tight around the waist. The else has changed in his HandP and his diabetes is pretty well controlled. most recent hemoglobin A1c on 12/06/2016 was 7.8%. he was reviewed by his endocrinologist Dr. Dwyane Dee who made appropriate modifications to his treatment plan. he was recently seen by neurology for mild cognitive impairment that was found to be normal for his age 30/21/18 patient appears to be doing well in regard to his abdominal wound area on evaluation today. He has been tolerating the dressing changes with the [facility] Dressing I'm pleased with how this has  progressed and in fact the wound appears to be completely healed on evaluation today. There is definitely no evidence of infection. 03/29/17 Patient's wound on the abdominal region appears to be completely healed on evaluation today. This wound has been doing very well and I think the biggest thing is going to be keeping it from reactivation where it rubbed on his pants. 04/18/2017 -- he was recently discharged about 3 weeks ago and was using a binder which probably caused him abrasion on the area of the recently healed scar. 05/02/17 on evaluation today patient appears to be doing fairly well in regard to his abdominal wound at the site of the surgical scar. Unfortunately the scar tissue is very fragile and tends to reopen. He did get a small abdominal binder to try to pad the area in order to offload and prevent his pants from rubbing specifically at the site. With that being said he tells me that Dr. Con Memos was concerned that the binder might actually have rub the area causing a reopening. I did have a look at this today and unfortunately it appears that he was putting this over his pants which was making matters worse. 05/09/17 on evaluation today patient continues to do well with the [facility] Dressing. There is a slight skin tear inferior to the wound that we have been treating that may have been due to the dressing sticking slightly. This is minimal however. I did discuss with patient prior to removal of the dressing for dressing changes that they whet this in order to loosen it and not risk damaging in the field. Nonetheless it appears that they have been doing very well with the dressing changes he had his wife took over  the dressing changes as she states that he was doing it wrong. Overall though I'm pleased with how things are progressing. No fevers, chills, nausea, or vomiting noted at this time. Readmission: 08/27/17 on evaluation today patient appears to be doing similarly to how he  was doing prior to healing previously August 2018 when we previously discharged him. He has fortunately been doing well up until just a couple of weeks ago and he had some of the [facility] Dressing over so he began to treat this on his own. He states that this has gotten much smaller but he Munnerlyn, BERLE FITZ. ([id number]) ran out of supplies and therefore made an appointment to come back in for evaluation. No fevers, chills, nausea, or vomiting noted at this time. He has no discomfort at this point in regard to the wound. He does tell me that he has one maybe two compression fractures in his lower back and that he is going to be seen by the physician at [facility] imaging, Dr. Estanislado Pandy. ====== Old notes The 77 year old gentleman who has a past medical history of diabetes mellitus, end-stage renal disease on dialysis, hypertension, secondary hyperparathyroidism, peripheral vascular disease and history of previous arterial bifemoral bypass graft in 1992 also has a history of gout, hyperlipidemia, hypothyroidism. last hemoglobin A1c was 7.5%. status post AV fistula placement, breast surgery for left granulomatous mastitis, triple a repair in 1992, small bowel obstruction, upper arm angiograms for AV fistula on the left side. was noted to have a superficial skin wound in the periumbilical area since late December 2017. He was asked to clean it with hydrogen peroxide and apply Bactroban. He was seen by vascular surgery in October 2017 where his right ABI was 0.56 left ABI was 0.87 and right TBI was 0.69 and left TBI was 0.70 ===== 09/25/16; patient was previously a patient of Dr. Ardeen Garland who has recurrent skin breakdown in the area of the large incision that was initially 4 apparently an open AAA aneurysm repair. He had secondary surgery but I'm not sure of what etiology this was. He is been using Hydrofera Blue to the open wounds in the mid abdominal surgical incision. This is now closed over. I  think he also uses this area as his waist for his pants and I've asked him to adjust this Electronic Signature(s) Signed: 09/25/2017 5:48:27 PM By: Linton Ham MD Entered By: Linton Ham on 09/25/2017 17:41:06 Finkle, [name] ([id number]) -------------------------------------------------------------------------------- Physical Exam Details Patient Name: [name]. Date of Service: 09/25/2017 3:15 PM Medical Record Number: [id number] Patient Account Number: [id number] Date of Birth/Sex: 1941/04/22 (77 y.o. Male) Treating RN: Montey Hora Primary Care Provider: Tedra Senegal Other Clinician: Referring Provider: Tedra Senegal Treating Provider/Extender: Ricard Dillon Weeks in Treatment: 4 Constitutional Patient is hypotensive.but appears well. Pulse regular and within target range for patient.Marland Kitchen Respirations regular, non-labored and within target range.. Temperature is normal and within the target range for the patient.Marland Kitchen appears in no distress. Notes wound exam; the areas in the mid part of a large abdominal incision. Near where his umbilicus would've been. This is all closed over. The skin itself is dry and somewhat scaly and I have recommended skin cream to the area. Patient expressed understanding Electronic Signature(s) Signed: 09/25/2017 5:48:27 PM By: Linton Ham MD Entered By: Linton Ham on 09/25/2017 17:41:59 Galentine, [name] ([id number]) -------------------------------------------------------------------------------- Physician Orders Details Patient Name: [name]. Date of Service: 09/25/2017 3:15 PM Medical Record Number: [id number] Patient Account Number: [id number] Date of Birth/Sex:  August 29, 1941 (77 y.o. Male) Treating RN: Montey Hora Primary Care Provider: Tedra Senegal Other Clinician: Referring Provider: Tedra Senegal Treating Provider/Extender: Tito Dine in Treatment: 4 Verbal / Phone Orders: No Diagnosis Coding Discharge From  [facility] Services o Discharge from [place] - Use moisturizing lotion daily and keep area protected Electronic Signature(s) Signed: 09/25/2017 5:10:34 PM By: Montey Hora Signed: 09/25/2017 5:48:27 PM By: Linton Ham MD Entered By: Montey Hora on 09/25/2017 15:45:48 Lattner, [name] ([id number]) -------------------------------------------------------------------------------- Problem List Details Patient Name: [name]. Date of Service: 09/25/2017 3:15 PM Medical Record Number: [id number] Patient Account Number: [id number] Date of Birth/Sex: 09/16/1941 (77 y.o. Male) Treating RN: Montey Hora Primary Care Provider: Tedra Senegal Other Clinician: Referring Provider: Tedra Senegal Treating Provider/Extender: Tito Dine in Treatment: 4 Active Problems ICD-10 Encounter Code Description Active Date Diagnosis E11.622 Type 2 diabetes mellitus with other skin ulcer 08/27/2017 Yes S31.102S Unspecified open wound of abdominal wall, epigastric region 08/27/2017 Yes without penetration into peritoneal cavity, sequela Z99.2 Dependence on renal dialysis 08/27/2017 Yes Inactive Problems Resolved Problems Electronic Signature(s) Signed: 09/25/2017 5:48:27 PM By: Linton Ham MD Entered By: Linton Ham on 09/25/2017 17:38:54 Stlouis, [name] ([id number]) -------------------------------------------------------------------------------- Progress Note Details Patient Name: [name]. Date of Service: 09/25/2017 3:15 PM Medical Record Number: [id number] Patient Account Number: [id number] Date of Birth/Sex: [birth year] (77 y.o. Male) Treating RN: Montey Hora Primary Care Provider: Tedra Senegal Other Clinician: Referring Provider: Tedra Senegal Treating Provider/Extender: Tito Dine in Treatment: 4 Subjective History of Present Illness (HPI) The following HPI elements were documented for the patient's wound: Location: periumbilical area. is on the left  of the midline Quality: Patient reports No Pain. Severity: Patient states wound (s) are getting better. Duration: Patient has had the wound for < 2 weeks prior to presenting for treatment Context: The wound appeared gradually over time Modifying Factors: Patient is currently on renal dialysis and receives treatments 3 times weekly Associated Signs and Symptoms: Patient reports having:then local care with hydrogen peroxide and Bactroban ointment 77 year old gentleman seen earlier this year in January for a abdominal wound is now back with the same problem which has recurred for about 2 weeks and he has been trying to apply some local antibiotic ointment and a Band-Aid there. Of note he has significant pressure in this area due to his beltline and may have put on some weight which results in his trousers being very tight around the waist. The else has changed in his HandP and his diabetes is pretty well controlled. most recent hemoglobin A1c on 12/06/2016 was 7.8%. he was reviewed by his endocrinologist Dr. Dwyane Dee who made appropriate modifications to his treatment plan. he was recently seen by neurology for mild cognitive impairment that was found to be normal for his age 76/21/18 patient appears to be doing well in regard to his abdominal wound area on evaluation today. He has been tolerating the dressing changes with the [facility] Dressing I'm pleased with how this has progressed and in fact the wound appears to be completely healed on evaluation today. There is definitely no evidence of infection. 03/29/17 Patient's wound on the abdominal region appears to be completely healed on evaluation today. This wound has been doing very well and I think the biggest thing is going to be keeping it from reactivation where it rubbed on his pants. 04/18/2017 -- he was recently discharged about 3 weeks ago and was using a binder which probably caused him abrasion on the  area of the recently healed  scar. 05/02/17 on evaluation today patient appears to be doing fairly well in regard to his abdominal wound at the site of the surgical scar. Unfortunately the scar tissue is very fragile and tends to reopen. He did get a small abdominal binder to try to pad the area in order to offload and prevent his pants from rubbing specifically at the site. With that being said he tells me that Dr. Con Memos was concerned that the binder might actually have rub the area causing a reopening. I did have a look at this today and unfortunately it appears that he was putting this over his pants which was making matters worse. 05/09/17 on evaluation today patient continues to do well with the [facility] Dressing. There is a slight skin tear inferior to the wound that we have been treating that may have been due to the dressing sticking slightly. This is minimal however. I did discuss with patient prior to removal of the dressing for dressing changes that they whet this in order to loosen it and not risk damaging in the field. Nonetheless it appears that they have been doing very well with the dressing changes he had his wife took over the dressing changes as she states that he was doing it wrong. Overall though I'm pleased with how things are progressing. No fevers, chills, nausea, or vomiting noted at this time. Readmission: 08/27/17 on evaluation today patient appears to be doing similarly to how he was doing prior to healing previously August 2018 when we previously discharged him. He has fortunately been doing well up until just a couple of weeks ago and he had some of the [facility] Dressing over so he began to treat this on his own. He states that this has gotten much smaller but he ran out of supplies and therefore made an appointment to come back in for evaluation. No fevers, chills, nausea, or vomiting Kees, ABDIEL BLACKERBY. ([id number]) noted at this time. He has no discomfort at this point in regard to the  wound. He does tell me that he has one maybe two compression fractures in his lower back and that he is going to be seen by the physician at [facility] imaging, Dr. Estanislado Pandy. ====== Old notes The 77 year old gentleman who has a past medical history of diabetes mellitus, end-stage renal disease on dialysis, hypertension, secondary hyperparathyroidism, peripheral vascular disease and history of previous arterial bifemoral bypass graft in 1992 also has a history of gout, hyperlipidemia, hypothyroidism. last hemoglobin A1c was 7.5%. status post AV fistula placement, breast surgery for left granulomatous mastitis, triple a repair in 1992, small bowel obstruction, upper arm angiograms for AV fistula on the left side. was noted to have a superficial skin wound in the periumbilical area since late December 2017. He was asked to clean it with hydrogen peroxide and apply Bactroban. He was seen by vascular surgery in October 2017 where his right ABI was 0.56 left ABI was 0.87 and right TBI was 0.69 and left TBI was 0.70 ===== 09/25/16; patient was previously a patient of Dr. Ardeen Garland who has recurrent skin breakdown in the area of the large incision that was initially 4 apparently an open AAA aneurysm repair. He had secondary surgery but I'm not sure of what etiology this was. He is been using Hydrofera Blue to the open wounds in the mid abdominal surgical incision. This is now closed over. I think he also uses this area as his waist for his pants  and I've asked him to adjust this Objective Constitutional Patient is hypotensive.but appears well. Pulse regular and within target range for patient.Marland Kitchen Respirations regular, non-labored and within target range.. Temperature is normal and within the target range for the patient.Marland Kitchen appears in no distress. Vitals Time Taken: 3:23 PM, Height: 72 in, Weight: 201 lbs, BMI: 27.3, Temperature: 98.2 F, Pulse: 111 bpm, Respiratory Rate: 18 breaths/min, Blood Pressure:  91/56 mmHg. General Notes: wound exam; the areas in the mid part of a large abdominal incision. Near where his umbilicus would've been. This is all closed over. The skin itself is dry and somewhat scaly and I have recommended skin cream to the area. Patient expressed understanding Integumentary (Hair, Skin) Wound #4 status is Open. Original cause of wound was Pressure Injury. The wound is located on the Abdomen - midline. The wound measures 0cm length x 0cm width x 0cm depth; 0cm^2 area and 0cm^3 volume. Assessment Active Problems ICD-10 E11.622 - Type 2 diabetes mellitus with other skin ulcer Cina, [name] ([id number]) S31.102S - Unspecified open wound of abdominal wall, epigastric region without penetration into peritoneal cavity, sequela Z99.2 - Dependence on renal dialysis Plan Discharge From [facility] Services: Discharge from [place] - Use moisturizing lotion daily and keep area protected #1 keep area moisturized #2 try to keep the waistband of his pants away from this area as it might create friction especially if that is overly tight when #3 patient can be discharged from the clinic Electronic Signature(s) Signed: 09/25/2017 5:48:27 PM By: Linton Ham MD Entered By: Linton Ham on 09/25/2017 17:42:37 Cato, [name] ([id number]) -------------------------------------------------------------------------------- SuperBill Details Patient Name: [name]. Date of Service: 09/25/2017 Medical Record Number: [id number] Patient Account Number: [id number] Date of Birth/Sex: [birth year] (77 y.o. Male) Treating RN: Montey Hora Primary Care Provider: Tedra Senegal Other Clinician: Referring Provider: Tedra Senegal Treating Provider/Extender: Tito Dine in Treatment: 4 Diagnosis Coding ICD-10 Codes Code Description E11.622 Type 2 diabetes mellitus with other skin ulcer Unspecified open wound of abdominal wall, epigastric region without penetration into  peritoneal cavity, S31.102S sequela Z99.2 Dependence on renal dialysis Facility Procedures CPT4 Code: [id number] Description: [id number] - WOUND CARE VISIT-LEV 2 EST PT Modifier: Quantity: 1 Physician Procedures CPT4: Description Modifier Quantity Code [id number] - WC PHYS LEVEL 2 - EST PT 1 ICD-10 Diagnosis Description E11.622 Type 2 diabetes mellitus with other skin ulcer S31.102S Unspecified open wound of abdominal wall, epigastric region without  penetration into peritoneal cavity, sequela Electronic Signature(s) Signed: 09/25/2017 5:48:27 PM By: Linton Ham MD Entered By: Linton Ham on 09/25/2017 17:42:56

## 2017-09-26 NOTE — Addendum Note (Signed)
Addended by: Mady Haagensen on: 09/26/2017 01:00 PM   Modules accepted: Orders

## 2017-09-26 NOTE — Addendum Note (Signed)
Addended by: Mady Haagensen on: 09/26/2017 03:22 PM   Modules accepted: Orders

## 2017-09-27 DIAGNOSIS — N186 End stage renal disease: Secondary | ICD-10-CM | POA: Diagnosis not present

## 2017-09-27 DIAGNOSIS — N2581 Secondary hyperparathyroidism of renal origin: Secondary | ICD-10-CM | POA: Diagnosis not present

## 2017-09-27 DIAGNOSIS — D631 Anemia in chronic kidney disease: Secondary | ICD-10-CM | POA: Diagnosis not present

## 2017-09-27 DIAGNOSIS — D509 Iron deficiency anemia, unspecified: Secondary | ICD-10-CM | POA: Diagnosis not present

## 2017-09-27 DIAGNOSIS — E1129 Type 2 diabetes mellitus with other diabetic kidney complication: Secondary | ICD-10-CM | POA: Diagnosis not present

## 2017-09-30 DIAGNOSIS — E1129 Type 2 diabetes mellitus with other diabetic kidney complication: Secondary | ICD-10-CM | POA: Diagnosis not present

## 2017-09-30 DIAGNOSIS — N186 End stage renal disease: Secondary | ICD-10-CM | POA: Diagnosis not present

## 2017-09-30 DIAGNOSIS — D631 Anemia in chronic kidney disease: Secondary | ICD-10-CM | POA: Diagnosis not present

## 2017-09-30 DIAGNOSIS — D509 Iron deficiency anemia, unspecified: Secondary | ICD-10-CM | POA: Diagnosis not present

## 2017-09-30 DIAGNOSIS — N2581 Secondary hyperparathyroidism of renal origin: Secondary | ICD-10-CM | POA: Diagnosis not present

## 2017-10-02 DIAGNOSIS — D509 Iron deficiency anemia, unspecified: Secondary | ICD-10-CM | POA: Diagnosis not present

## 2017-10-02 DIAGNOSIS — N186 End stage renal disease: Secondary | ICD-10-CM | POA: Diagnosis not present

## 2017-10-02 DIAGNOSIS — N2581 Secondary hyperparathyroidism of renal origin: Secondary | ICD-10-CM | POA: Diagnosis not present

## 2017-10-02 DIAGNOSIS — E1129 Type 2 diabetes mellitus with other diabetic kidney complication: Secondary | ICD-10-CM | POA: Diagnosis not present

## 2017-10-02 DIAGNOSIS — D631 Anemia in chronic kidney disease: Secondary | ICD-10-CM | POA: Diagnosis not present

## 2017-10-03 ENCOUNTER — Ambulatory Visit
Admission: RE | Admit: 2017-10-03 | Discharge: 2017-10-03 | Disposition: A | Discharge status: Home / Self Care | Payer: Medicare Other | Source: Ambulatory Visit | Attending: Internal Medicine | Admitting: Internal Medicine

## 2017-10-03 DIAGNOSIS — N281 Cyst of kidney, acquired: Secondary | ICD-10-CM | POA: Diagnosis not present

## 2017-10-03 DIAGNOSIS — R1032 Left lower quadrant pain: Principal | ICD-10-CM

## 2017-10-03 DIAGNOSIS — R1031 Right lower quadrant pain: Secondary | ICD-10-CM

## 2017-10-04 ENCOUNTER — Other Ambulatory Visit: Payer: Medicare Other

## 2017-10-04 ENCOUNTER — Ambulatory Visit: Payer: Medicare Other | Admitting: Internal Medicine

## 2017-10-04 DIAGNOSIS — D509 Iron deficiency anemia, unspecified: Secondary | ICD-10-CM | POA: Diagnosis not present

## 2017-10-04 DIAGNOSIS — E1129 Type 2 diabetes mellitus with other diabetic kidney complication: Secondary | ICD-10-CM | POA: Diagnosis not present

## 2017-10-04 DIAGNOSIS — N2581 Secondary hyperparathyroidism of renal origin: Secondary | ICD-10-CM | POA: Diagnosis not present

## 2017-10-04 DIAGNOSIS — N186 End stage renal disease: Secondary | ICD-10-CM | POA: Diagnosis not present

## 2017-10-04 DIAGNOSIS — D631 Anemia in chronic kidney disease: Secondary | ICD-10-CM | POA: Diagnosis not present

## 2017-10-07 DIAGNOSIS — E1129 Type 2 diabetes mellitus with other diabetic kidney complication: Secondary | ICD-10-CM | POA: Diagnosis not present

## 2017-10-07 DIAGNOSIS — N2581 Secondary hyperparathyroidism of renal origin: Secondary | ICD-10-CM | POA: Diagnosis not present

## 2017-10-07 DIAGNOSIS — D631 Anemia in chronic kidney disease: Secondary | ICD-10-CM | POA: Diagnosis not present

## 2017-10-07 DIAGNOSIS — N186 End stage renal disease: Secondary | ICD-10-CM | POA: Diagnosis not present

## 2017-10-07 DIAGNOSIS — D509 Iron deficiency anemia, unspecified: Secondary | ICD-10-CM | POA: Diagnosis not present

## 2017-10-09 DIAGNOSIS — E1129 Type 2 diabetes mellitus with other diabetic kidney complication: Secondary | ICD-10-CM | POA: Diagnosis not present

## 2017-10-09 DIAGNOSIS — N186 End stage renal disease: Secondary | ICD-10-CM | POA: Diagnosis not present

## 2017-10-09 DIAGNOSIS — N2581 Secondary hyperparathyroidism of renal origin: Secondary | ICD-10-CM | POA: Diagnosis not present

## 2017-10-09 DIAGNOSIS — D631 Anemia in chronic kidney disease: Secondary | ICD-10-CM | POA: Diagnosis not present

## 2017-10-09 DIAGNOSIS — D509 Iron deficiency anemia, unspecified: Secondary | ICD-10-CM | POA: Diagnosis not present

## 2017-10-11 DIAGNOSIS — N2581 Secondary hyperparathyroidism of renal origin: Secondary | ICD-10-CM | POA: Diagnosis not present

## 2017-10-11 DIAGNOSIS — D631 Anemia in chronic kidney disease: Secondary | ICD-10-CM | POA: Diagnosis not present

## 2017-10-11 DIAGNOSIS — D509 Iron deficiency anemia, unspecified: Secondary | ICD-10-CM | POA: Diagnosis not present

## 2017-10-11 DIAGNOSIS — N186 End stage renal disease: Secondary | ICD-10-CM | POA: Diagnosis not present

## 2017-10-11 DIAGNOSIS — E1129 Type 2 diabetes mellitus with other diabetic kidney complication: Secondary | ICD-10-CM | POA: Diagnosis not present

## 2017-10-14 DIAGNOSIS — D509 Iron deficiency anemia, unspecified: Secondary | ICD-10-CM | POA: Diagnosis not present

## 2017-10-14 DIAGNOSIS — N186 End stage renal disease: Secondary | ICD-10-CM | POA: Diagnosis not present

## 2017-10-14 DIAGNOSIS — D631 Anemia in chronic kidney disease: Secondary | ICD-10-CM | POA: Diagnosis not present

## 2017-10-14 DIAGNOSIS — E1129 Type 2 diabetes mellitus with other diabetic kidney complication: Secondary | ICD-10-CM | POA: Diagnosis not present

## 2017-10-14 DIAGNOSIS — N2581 Secondary hyperparathyroidism of renal origin: Secondary | ICD-10-CM | POA: Diagnosis not present

## 2017-10-16 DIAGNOSIS — D631 Anemia in chronic kidney disease: Secondary | ICD-10-CM | POA: Diagnosis not present

## 2017-10-16 DIAGNOSIS — E1129 Type 2 diabetes mellitus with other diabetic kidney complication: Secondary | ICD-10-CM | POA: Diagnosis not present

## 2017-10-16 DIAGNOSIS — N2581 Secondary hyperparathyroidism of renal origin: Secondary | ICD-10-CM | POA: Diagnosis not present

## 2017-10-16 DIAGNOSIS — D509 Iron deficiency anemia, unspecified: Secondary | ICD-10-CM | POA: Diagnosis not present

## 2017-10-16 DIAGNOSIS — N186 End stage renal disease: Secondary | ICD-10-CM | POA: Diagnosis not present

## 2017-10-17 ENCOUNTER — Encounter (INDEPENDENT_AMBULATORY_CARE_PROVIDER_SITE_OTHER): Payer: Self-pay

## 2017-10-17 ENCOUNTER — Ambulatory Visit (INDEPENDENT_AMBULATORY_CARE_PROVIDER_SITE_OTHER): Payer: Medicare Other | Admitting: Physician Assistant

## 2017-10-17 ENCOUNTER — Other Ambulatory Visit (INDEPENDENT_AMBULATORY_CARE_PROVIDER_SITE_OTHER): Payer: Medicare Other

## 2017-10-17 ENCOUNTER — Encounter: Payer: Self-pay | Admitting: Physician Assistant

## 2017-10-17 VITALS — BP 100/58 | HR 76 | Ht 71.0 in | Wt 189.0 lb

## 2017-10-17 DIAGNOSIS — R1030 Lower abdominal pain, unspecified: Secondary | ICD-10-CM

## 2017-10-17 DIAGNOSIS — D649 Anemia, unspecified: Secondary | ICD-10-CM | POA: Diagnosis not present

## 2017-10-17 DIAGNOSIS — R195 Other fecal abnormalities: Secondary | ICD-10-CM | POA: Diagnosis not present

## 2017-10-17 LAB — CBC WITH DIFFERENTIAL/PLATELET
Basophils Absolute: 0.1 10*3/uL (ref 0.0–0.1)
Basophils Relative: 0.7 % (ref 0.0–3.0)
Eosinophils Absolute: 0.3 10*3/uL (ref 0.0–0.7)
Eosinophils Relative: 2.6 % (ref 0.0–5.0)
HCT: 34.7 % — ABNORMAL LOW (ref 39.0–52.0)
Hemoglobin: 11.4 g/dL — ABNORMAL LOW (ref 13.0–17.0)
Lymphocytes Relative: 23 % (ref 12.0–46.0)
Lymphs Abs: 2.5 10*3/uL (ref 0.7–4.0)
MCHC: 32.7 g/dL (ref 30.0–36.0)
MCV: 107.1 fl — ABNORMAL HIGH (ref 78.0–100.0)
Monocytes Absolute: 0.7 10*3/uL (ref 0.1–1.0)
Monocytes Relative: 7 % (ref 3.0–12.0)
Neutro Abs: 7.1 10*3/uL (ref 1.4–7.7)
Neutrophils Relative %: 66.7 % (ref 43.0–77.0)
Platelets: 291 10*3/uL (ref 150.0–400.0)
RBC: 3.24 Mil/uL — ABNORMAL LOW (ref 4.22–5.81)
RDW: 18.3 % — ABNORMAL HIGH (ref 11.5–15.5)
WBC: 10.7 10*3/uL — ABNORMAL HIGH (ref 4.0–10.5)

## 2017-10-17 MED ORDER — NA SULFATE-K SULFATE-MG SULF 17.5-3.13-1.6 GM/177ML PO SOLN: 1.0000 | Freq: Once | ORAL | 0 refills | Status: AC

## 2017-10-17 MED ORDER — OMEPRAZOLE 40 MG PO CPDR: 40.0000 mg | DELAYED_RELEASE_CAPSULE | Freq: Every day | ORAL | 3 refills | Status: DC

## 2017-10-17 NOTE — Progress Notes (Signed)
Subjective:    Patient ID: [name], male    DOB: [birth year], 77 y.o.   MRN: [id number]  HPI [name] is a pleasant 77 year old African-American male, known remotely to Dr. Deatra Ina from prior colonoscopy done in 2010. Patient comes in today with complaints of lower abdominal cramping and discomfort, a few recent episodes of diarrhea 1 with incontinence and complaints of dark stools. 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 He had a recent compression fracture sustained several weeks ago after he picked up a suitcase. Says this was quite painful and he started having lower abdominal pain at the same time that he developed the fracture. He has since undergone a vertebroplasty at L3, and has had good relief of his pain. Over the past several weeks he has noted very dark stools. He believes he did take some Pepto-Bismol but was not taking this on a regular basis. He continued to have some lower abdominal discomfort, cramping off and on and had one bad episode of cramping and incontinent diarrhea after dialysis 2 weeks ago. He had one other episode similar symptoms in both times had eaten fig newtons. Gen. he says he is having 2 bowel movements per day which are not diarrheal. He had an episode of nausea and vomiting after eating last night. His does not feel that his stools are as dark as they were couple of weeks ago but still not normal. He take 650 mg of aspirin daily, no NSAIDs. Denies any regular heartburn or indigestion and no dysphagia. He says his appetite is always poor and he has lost some weight recently. Labs on 08/11/2017 hemoglobin 11.6 hematocrit of 34.71 year ago hemoglobin 11.2 MCV of 107. Labs from 09/26/2017  hemoglobin 10.9 hematocrit 32.4 and MCV of 105, B12 and folate levels were normal. CT of the abdomen and pelvis 10/03/2016 showed no acute abnormality of the abdomen and pelvis there was colonic diverticulosis without diverticulitis, stable likely benign liver lesions, but bilateral renal cysts aortic atherosclerosis and vertebral augmentation at L3.  Review of Systems Pertinent positive and negative review of systems were noted in the above HPI section.  All other review of systems was otherwise negative.  Outpatient Encounter Medications as of 10/17/2017  Medication Sig  . acetaminophen (TYLENOL) 500 MG tablet Take 500 mg by mouth every 6 (six) hours as needed (for pain).   Marland Kitchen acetaminophen-codeine (TYLENOL #3) 300-30 MG tablet take 1 tablet by mouth every 4 hours if needed for pain  . allopurinol (ZYLOPRIM) 100 MG tablet Take 100 mg by mouth daily.    Marland Kitchen aspirin 325 MG tablet Take 650 mg by mouth daily.   Marland Kitchen azithromycin (ZITHROMAX Z-PAK) 250 MG tablet One po daily x 5 days  . Blood Glucose Monitoring Suppl (FREESTYLE FREEDOM LITE) w/Device KIT Use to check blood sugar 2 times per day dx code E11.65  . clobetasol (TEMOVATE) 0.05 % external solution apply to affected area ON THE SCALP UP TO 2 TIMES A DAY AS NEEDED...  (REFER TO PRESCRIPTION NOTES).  Marland Kitchen donepezil (ARICEPT) 10 MG tablet Take 1 tablet (10 mg total) by mouth at bedtime.  Marland Kitchen FLUOCINOLONE ACETONIDE SCALP 0.01 % OIL apply to affected area ON SCALP EVERY NIGHT AT BEDTIME, [place] UPON AWAKENING  . fluticasone (CUTIVATE) 0.05 % cream apply to affected area ON FACE UP  TO 2 TIMES A DAY AS NEEDED  . fluticasone (FLONASE) 50 MCG/ACT nasal spray Place 1 spray into both nostrils daily. (Patient taking differently: Place 1 spray into both nostrils daily as needed for allergies. )  . glucose blood (ONETOUCH VERIO) test strip Use to check blood sugar 2 times per day. Dx code E11.65  . insulin aspart (NOVOLOG) 100 UNIT/ML injection Inject 10 Units into the  skin 3 (three) times daily before meals. (Patient taking differently: Inject 10 Units into the skin See admin instructions. Two to three times a day before meals)  . insulin glargine (LANTUS) 100 UNIT/ML injection Inject 0.08 mLs (8 Units total) into the skin every morning. (Patient taking differently: Inject 13 Units into the skin every morning. )  . Insulin Pen Needle (NOVOFINE) 30G X 8 MM MISC Use to inject insulin 3 times daily  . ketoconazole (NIZORAL) 2 % shampoo Apply 1 application topically 2 (two) times a week.  . Lancets (FREESTYLE) lancets Use as instructed to check blood sugar 2 times per day dx code E11.65  . levothyroxine (SYNTHROID, LEVOTHROID) 50 MCG tablet Take 1 tablet (50 mcg total) by mouth daily.  Marland Kitchen lidocaine-prilocaine (EMLA) cream Apply 1 application topically once. AS DIRECTED  . loratadine (CLARITIN) 10 MG tablet Take 10 mg by mouth daily as needed for allergies.   . metroNIDAZOLE (METROGEL) 0.75 % gel APPLY TO AFFECTED AREA ON SKIN  ON FACE TWICE DAILY  . mupirocin ointment (BACTROBAN) 2 % Use as directed to affected areas  . Naftifine HCl (NAFTIN) 2 % CREA Apply to feet daily for fungal infection  . nystatin-triamcinolone (MYCOLOG II) cream Apply 1 application topically 2 (two) times daily.  . pioglitazone (ACTOS) 30 MG tablet take 1 tablet by mouth once daily  . promethazine (PHENERGAN) 12.5 MG tablet Take 1 tablet (12.5 mg total) by mouth every 12 (twelve) hours as needed for nausea or vomiting.  Marland Kitchen RENVELA 800 MG tablet Take 800-2,400 mg by mouth See admin instructions. 1,600-2,400 mg with breakfast then 1,600 mg with lunch then 2,400 mg with dinner (evening meal) and 800 mg with each snack  . SENSIPAR 30 MG tablet Take 30 mg by mouth daily.   . simvastatin (ZOCOR) 20 MG tablet Take 20 mg by mouth at bedtime.   . traMADol (ULTRAM) 50 MG tablet Take 1 tablet (50 mg total) by mouth every 12 (twelve) hours as needed.  . triamcinolone ointment (KENALOG) 0.1 % Apply 1  application topically 2 (two) times daily. Use between thighs  . Na Sulfate-K Sulfate-Mg Sulf 17.5-3.13-1.6 GM/177ML SOLN Take 1 kit by mouth once for 1 dose.  Marland Kitchen omeprazole (PRILOSEC) 40 MG capsule Take 1 capsule (40 mg total) by mouth daily.   No facility-administered encounter medications on file as of 10/17/2017.    Allergies  Allergen Reactions  . Penicillins Rash    Has patient had a PCN reaction causing immediate rash, facial/tongue/throat swelling, SOB or lightheadedness with hypotension: Yes Has patient had a PCN reaction causing severe rash involving mucus membranes or skin necrosis: Yes Has patient had a PCN reaction that required hospitalization: No Has patient had a PCN reaction occurring within the last 10 years: No If all of the above answers are "NO", then may proceed with Cephalosporin use.    Patient Active Problem List   Diagnosis Date Noted  . Dyspnea   . Macrocytic anemia 06/04/2016  . Thrombocytopenia ([place]) 06/04/2016  . Exertional dyspnea 06/04/2016  . Arm paresthesia, left 06/04/2016  .  Dyspnea on exertion 06/04/2016  . Tenderness of right calf 06/04/2016  . Mild cognitive impairment with memory loss 01/12/2016  . ESRD on dialysis ([place]) 05/17/2015  . Hypoglycemia   . Weakness 02/18/2015  . OSA (obstructive sleep apnea) 02/02/2015  . Hyperkalemia 10/05/2014  . Chronic kidney disease (CKD), stage IV (severe) ([place]) 04/07/2014  . End stage renal disease ([place]) 11/18/2013  . BPH (benign prostatic hyperplasia) 11/22/2012  . Peripheral vascular disease ([place]) 12/24/2011  . Hyperparathyroidism ([place]) 12/24/2011  . Hypothyroidism 12/24/2011  . Allergic rhinitis 12/24/2011  . Erectile dysfunction 12/24/2011  . Insulin dependent diabetes mellitus ([place]) 09/03/2008  . Hyperlipidemia 09/03/2008  . Essential hypertension 08/30/2008  . PANCREATITIS, HX OF 08/30/2008  . RENAL FAILURE, ACUTE, HX OF 08/30/2008  . DIVERTICULOSIS, COLON 06/08/2003   Social History    Socioeconomic History  . Marital status: Married    Spouse name: [name] Mood  . Number of children: 1  . Years of education: 93  . Highest education level: Not on file  Social Needs  . Financial resource strain: Not on file  . Food insecurity - worry: Not on file  . Food insecurity - inability: Not on file  . Transportation needs - medical: No  . Transportation needs - non-medical: No  Occupational History  . Occupation: Retired  Tobacco Use  . Smoking status: Former Smoker    Last attempt to quit: 09/24/1994    Years since quitting: 23.0  . Smokeless tobacco: Never Used  Substance and Sexual Activity  . Alcohol use: No    Alcohol/week: 0.0 oz    Comment: Quit Oct. 1977 ("somewhat heavy")  . Drug use: No  . Sexual activity: Not on file  Other Topics Concern  . Not on file  Social History Narrative   Lives at home with wife.   Caffeine use: Drinks no soda or tea.    Drinks 1 cup coffee/week   Right handed    Mr. Ruesch's family history includes Aneurysm in his mother; Diabetes in his father; Heart disease in his father; Hypertension in his father; Stroke in his father.      Objective:    Vitals:   10/17/17 1041  BP: (!) 100/58  Pulse: 76    Physical Exam  well-developed elderly African-American male in no acute distress, pleasant blood pressure 100/58 pulse 76, Height 5 foot 11, weight 189, BMI 26.3. HEENT; nontraumatic normocephalic EOMI PERRLA sclera anicteric, Cardiovascular; regular rate and rhythm with S1-S2, Pulmonary ;clear bilaterally, Abdomen ;soft, is a large midline incisional scar with a. Incisional wound, is no palpable mass or hepatosplenomegaly he has some tenderness bilaterally in the lower quadrants and in the right upper quadrant bowel sounds are present, Rectal ;exam decreased sphincter tone, no stool in the rectal vault which is brown and heme positive, Extremities ;no clubbing cyanosis or edema skin warm and dry, Neuropsych; mood and affect  appropriate       Assessment & Plan:   #48 77 year old African-American male, with end-stage renal disease on dialysis with several week history of dark stools, lower abdominal discomfort, intermittent cramping , episodes of urgent incontinent diarrhea. Patient had 1 episode of nausea and vomiting yesterday Recent negative CT Stool heme Positive today , not melenic Etiology of patient's symptoms and dark heme positive stool on clear. He has been on high-dose aspirin. Rule out gastropathy, peptic ulcer disease, occult small bowel or colonic lesion, AVMs. Also consider low-grade ischemic colitis in patient on dialysis  #2 insulin-dependent diabetes mellitus #3 peripheral  vascular disease status post aortobifemoral bypass #4 history of small bowel obstruction 96 secondary to adhesions #5 macrocytosis #6 hypothyroid #7 history of hypertension  Plan; repeat CBC today Start omeprazole 40 mg by mouth every morning Patient will be scheduled for upper endoscopy and colonoscopy with Dr. Silverio Decamp in the [facility] on a non-dialysis today.. Both procedures were discussed in detail with the patient including indications risks, benefits and he is agreeable to proceed. Have asked him to hold his aspirin for 3 days prior to procedures Further plans pending findings of above.   Amy Genia Harold PA-C 10/17/2017   Cc: Elby Showers, MD

## 2017-10-17 NOTE — Patient Instructions (Addendum)
Please go to the basement level to have your labs drawn.  We have sent the following medications to your pharmacy for you to pick up at your convenience: 1. Suprep for the colonoscopy 2. Omeprazole 40 mg  You have been scheduled for a colonoscopy and Endoscopy Please follow written instructions given to you at your visit today.  Please pick up your prep supplies at the pharmacy within the next 1-3 days. [facility].  If you use inhalers (even only as needed), please bring them with you on the day of your procedure. Your physician has requested that you go to www.startemmi.com and enter the access code given to you at your visit today. This web site gives a general overview about your procedure. However, you should still follow specific instructions given to you by our office regarding your preparation for the procedure.  If you are age 77 or older, your body mass index should be between 23-30. Your Body mass index is 26.36 kg/m. If this is out of the aforementioned range listed, please consider follow up with your Primary Care Provider.

## 2017-10-18 DIAGNOSIS — N186 End stage renal disease: Secondary | ICD-10-CM | POA: Diagnosis not present

## 2017-10-18 DIAGNOSIS — N2581 Secondary hyperparathyroidism of renal origin: Secondary | ICD-10-CM | POA: Diagnosis not present

## 2017-10-18 DIAGNOSIS — D631 Anemia in chronic kidney disease: Secondary | ICD-10-CM | POA: Diagnosis not present

## 2017-10-18 DIAGNOSIS — E1129 Type 2 diabetes mellitus with other diabetic kidney complication: Secondary | ICD-10-CM | POA: Diagnosis not present

## 2017-10-18 DIAGNOSIS — D509 Iron deficiency anemia, unspecified: Secondary | ICD-10-CM | POA: Diagnosis not present

## 2017-10-18 NOTE — Progress Notes (Signed)
Reviewed and agree with documentation and assessment and plan. K. Veena Christianjames Soule , MD   

## 2017-10-21 DIAGNOSIS — N2581 Secondary hyperparathyroidism of renal origin: Secondary | ICD-10-CM | POA: Diagnosis not present

## 2017-10-21 DIAGNOSIS — D509 Iron deficiency anemia, unspecified: Secondary | ICD-10-CM | POA: Diagnosis not present

## 2017-10-21 DIAGNOSIS — D631 Anemia in chronic kidney disease: Secondary | ICD-10-CM | POA: Diagnosis not present

## 2017-10-21 DIAGNOSIS — E1129 Type 2 diabetes mellitus with other diabetic kidney complication: Secondary | ICD-10-CM | POA: Diagnosis not present

## 2017-10-21 DIAGNOSIS — N186 End stage renal disease: Secondary | ICD-10-CM | POA: Diagnosis not present

## 2017-10-23 DIAGNOSIS — E1129 Type 2 diabetes mellitus with other diabetic kidney complication: Secondary | ICD-10-CM | POA: Diagnosis not present

## 2017-10-23 DIAGNOSIS — N186 End stage renal disease: Secondary | ICD-10-CM | POA: Diagnosis not present

## 2017-10-23 DIAGNOSIS — N2581 Secondary hyperparathyroidism of renal origin: Secondary | ICD-10-CM | POA: Diagnosis not present

## 2017-10-23 DIAGNOSIS — D631 Anemia in chronic kidney disease: Secondary | ICD-10-CM | POA: Diagnosis not present

## 2017-10-23 DIAGNOSIS — D509 Iron deficiency anemia, unspecified: Secondary | ICD-10-CM | POA: Diagnosis not present

## 2017-10-24 DIAGNOSIS — E1122 Type 2 diabetes mellitus with diabetic chronic kidney disease: Secondary | ICD-10-CM | POA: Diagnosis not present

## 2017-10-24 DIAGNOSIS — Z992 Dependence on renal dialysis: Secondary | ICD-10-CM | POA: Diagnosis not present

## 2017-10-24 DIAGNOSIS — N186 End stage renal disease: Secondary | ICD-10-CM | POA: Diagnosis not present

## 2017-10-25 DIAGNOSIS — N2581 Secondary hyperparathyroidism of renal origin: Secondary | ICD-10-CM | POA: Diagnosis not present

## 2017-10-25 DIAGNOSIS — D509 Iron deficiency anemia, unspecified: Secondary | ICD-10-CM | POA: Diagnosis not present

## 2017-10-25 DIAGNOSIS — N186 End stage renal disease: Secondary | ICD-10-CM | POA: Diagnosis not present

## 2017-10-25 DIAGNOSIS — Z992 Dependence on renal dialysis: Secondary | ICD-10-CM | POA: Diagnosis not present

## 2017-10-25 DIAGNOSIS — E1122 Type 2 diabetes mellitus with diabetic chronic kidney disease: Secondary | ICD-10-CM | POA: Diagnosis not present

## 2017-10-25 DIAGNOSIS — E1129 Type 2 diabetes mellitus with other diabetic kidney complication: Secondary | ICD-10-CM | POA: Diagnosis not present

## 2017-10-28 DIAGNOSIS — D509 Iron deficiency anemia, unspecified: Secondary | ICD-10-CM | POA: Diagnosis not present

## 2017-10-28 DIAGNOSIS — N186 End stage renal disease: Secondary | ICD-10-CM | POA: Diagnosis not present

## 2017-10-28 DIAGNOSIS — E1129 Type 2 diabetes mellitus with other diabetic kidney complication: Secondary | ICD-10-CM | POA: Diagnosis not present

## 2017-10-28 DIAGNOSIS — N2581 Secondary hyperparathyroidism of renal origin: Secondary | ICD-10-CM | POA: Diagnosis not present

## 2017-10-29 ENCOUNTER — Telehealth: Payer: Self-pay

## 2017-10-29 ENCOUNTER — Other Ambulatory Visit: Payer: Self-pay

## 2017-10-29 ENCOUNTER — Encounter (HOSPITAL_COMMUNITY): Payer: Self-pay

## 2017-10-29 DIAGNOSIS — N3000 Acute cystitis without hematuria: Secondary | ICD-10-CM | POA: Insufficient documentation

## 2017-10-29 DIAGNOSIS — Z87891 Personal history of nicotine dependence: Secondary | ICD-10-CM | POA: Diagnosis not present

## 2017-10-29 DIAGNOSIS — I2510 Atherosclerotic heart disease of native coronary artery without angina pectoris: Secondary | ICD-10-CM | POA: Diagnosis not present

## 2017-10-29 DIAGNOSIS — E039 Hypothyroidism, unspecified: Secondary | ICD-10-CM | POA: Insufficient documentation

## 2017-10-29 DIAGNOSIS — I120 Hypertensive chronic kidney disease with stage 5 chronic kidney disease or end stage renal disease: Secondary | ICD-10-CM | POA: Diagnosis not present

## 2017-10-29 DIAGNOSIS — Z7982 Long term (current) use of aspirin: Secondary | ICD-10-CM | POA: Insufficient documentation

## 2017-10-29 DIAGNOSIS — Z992 Dependence on renal dialysis: Secondary | ICD-10-CM | POA: Diagnosis not present

## 2017-10-29 DIAGNOSIS — I10 Essential (primary) hypertension: Secondary | ICD-10-CM | POA: Diagnosis not present

## 2017-10-29 DIAGNOSIS — R1084 Generalized abdominal pain: Secondary | ICD-10-CM | POA: Diagnosis not present

## 2017-10-29 DIAGNOSIS — Z79899 Other long term (current) drug therapy: Secondary | ICD-10-CM | POA: Insufficient documentation

## 2017-10-29 DIAGNOSIS — N186 End stage renal disease: Secondary | ICD-10-CM | POA: Insufficient documentation

## 2017-10-29 DIAGNOSIS — R1030 Lower abdominal pain, unspecified: Secondary | ICD-10-CM | POA: Diagnosis not present

## 2017-10-29 DIAGNOSIS — E1122 Type 2 diabetes mellitus with diabetic chronic kidney disease: Secondary | ICD-10-CM | POA: Insufficient documentation

## 2017-10-29 DIAGNOSIS — R296 Repeated falls: Secondary | ICD-10-CM | POA: Diagnosis not present

## 2017-10-29 DIAGNOSIS — R109 Unspecified abdominal pain: Secondary | ICD-10-CM | POA: Diagnosis present

## 2017-10-29 DIAGNOSIS — Z794 Long term (current) use of insulin: Secondary | ICD-10-CM | POA: Diagnosis not present

## 2017-10-29 LAB — COMPREHENSIVE METABOLIC PANEL
ALT: 13 U/L — ABNORMAL LOW (ref 17–63)
AST: 29 U/L (ref 15–41)
Albumin: 2.9 g/dL — ABNORMAL LOW (ref 3.5–5.0)
Alkaline Phosphatase: 88 U/L (ref 38–126)
Anion gap: 20 — ABNORMAL HIGH (ref 5–15)
BUN: 29 mg/dL — ABNORMAL HIGH (ref 6–20)
CO2: 24 mmol/L (ref 22–32)
Calcium: 9.5 mg/dL (ref 8.9–10.3)
Chloride: 92 mmol/L — ABNORMAL LOW (ref 101–111)
Creatinine, Ser: 6.03 mg/dL — ABNORMAL HIGH (ref 0.61–1.24)
GFR calc Af Amer: 9 mL/min — ABNORMAL LOW (ref >60)
GFR calc non Af Amer: 8 mL/min — ABNORMAL LOW (ref >60)
Glucose, Bld: 202 mg/dL — ABNORMAL HIGH (ref 65–99)
Potassium: 3.6 mmol/L (ref 3.5–5.1)
Sodium: 136 mmol/L (ref 135–145)
Total Bilirubin: 0.4 mg/dL (ref 0.3–1.2)
Total Protein: 6.5 g/dL (ref 6.5–8.1)

## 2017-10-29 LAB — CBC
HCT: 34.6 % — ABNORMAL LOW (ref 39.0–52.0)
Hemoglobin: 11.4 g/dL — ABNORMAL LOW (ref 13.0–17.0)
MCH: 34.2 pg — ABNORMAL HIGH (ref 26.0–34.0)
MCHC: 32.9 g/dL (ref 30.0–36.0)
MCV: 103.9 fL — ABNORMAL HIGH (ref 78.0–100.0)
Platelets: 280 10*3/uL (ref 150–400)
RBC: 3.33 MIL/uL — ABNORMAL LOW (ref 4.22–5.81)
RDW: 16.4 % — ABNORMAL HIGH (ref 11.5–15.5)
WBC: 8 10*3/uL (ref 4.0–10.5)

## 2017-10-29 LAB — LIPASE, BLOOD: Lipase: 47 U/L (ref 11–51)

## 2017-10-29 MED ORDER — OXYCODONE-ACETAMINOPHEN 5-325 MG PO TABS
1.0000 | ORAL_TABLET | ORAL | Status: AC | PRN
  Administered 2017-10-29 (×2): 1 via ORAL
  Filled 2017-10-29 (×2): qty 1

## 2017-10-29 NOTE — Telephone Encounter (Signed)
Patient's wife called patient is complaining of " severeback pain going down to his abdomen" and would like to know if she needs to bring him here or take him to the ER?  Call back number: [phone]

## 2017-10-29 NOTE — Telephone Encounter (Signed)
Emergency Dept

## 2017-10-29 NOTE — ED Triage Notes (Addendum)
Pt states he has abdominal pain radiating to his back. Pain is in the lower part of the abdomen. Pt had a recent procedure on his back. Pt states pain is in his groin and to his back. Wife states he has been having trouble getting out of bed as well as had more frequent falls. Pt is MWF HD patient.

## 2017-10-29 NOTE — Telephone Encounter (Signed)
Spoke with wife, [name] and advised that patient needed to go to the emergency department.  She had also spoken with his vascular doctor as well and they had advised that he should be taken to [facility].  Advised Dr. Renold Genta agreed.

## 2017-10-30 ENCOUNTER — Emergency Department (HOSPITAL_COMMUNITY): Payer: Medicare Other

## 2017-10-30 ENCOUNTER — Emergency Department (HOSPITAL_COMMUNITY)
Admission: EM | Admit: 2017-10-30 | Discharge: 2017-10-30 | Disposition: A | Discharge status: Home / Self Care | Payer: Medicare Other | Attending: Emergency Medicine | Admitting: Emergency Medicine

## 2017-10-30 DIAGNOSIS — N3000 Acute cystitis without hematuria: Secondary | ICD-10-CM

## 2017-10-30 DIAGNOSIS — R1030 Lower abdominal pain, unspecified: Secondary | ICD-10-CM | POA: Diagnosis not present

## 2017-10-30 DIAGNOSIS — R296 Repeated falls: Secondary | ICD-10-CM | POA: Diagnosis not present

## 2017-10-30 LAB — URINALYSIS, ROUTINE W REFLEX MICROSCOPIC
Bilirubin Urine: NEGATIVE
Glucose, UA: NEGATIVE mg/dL
Ketones, ur: NEGATIVE mg/dL
Nitrite: NEGATIVE
Protein, ur: 100 mg/dL — AB
Specific Gravity, Urine: 1.021 (ref 1.005–1.030)
pH: 7 (ref 5.0–8.0)

## 2017-10-30 LAB — I-STAT CG4 LACTIC ACID, ED
Lactic Acid, Venous: 4.56 mmol/L (ref 0.5–1.9)
Lactic Acid, Venous: 2.01 mmol/L (ref 0.5–1.9)

## 2017-10-30 MED ORDER — IOPAMIDOL (ISOVUE-370) INJECTION 76%
INTRAVENOUS | Status: AC
  Administered 2017-10-30: 100 mL
  Filled 2017-10-30: qty 100

## 2017-10-30 MED ORDER — OXYCODONE-ACETAMINOPHEN 5-325 MG PO TABS
1.0000 | ORAL_TABLET | Freq: Once | ORAL | Status: AC
  Administered 2017-10-30: 1 via ORAL
  Filled 2017-10-30: qty 1

## 2017-10-30 MED ORDER — OXYCODONE-ACETAMINOPHEN 5-325 MG PO TABS: 1.0000 | ORAL_TABLET | Freq: Four times a day (QID) | ORAL | Status: DC | PRN

## 2017-10-30 MED ORDER — CEPHALEXIN 250 MG PO CAPS: 250.0000 mg | ORAL_CAPSULE | Freq: Once | ORAL | Status: DC

## 2017-10-30 MED ORDER — CEPHALEXIN 250 MG PO CAPS: 250.0000 mg | ORAL_CAPSULE | Freq: Two times a day (BID) | ORAL | 0 refills | Status: DC

## 2017-10-30 MED ORDER — CIPROFLOXACIN HCL 500 MG PO TABS
250.0000 mg | ORAL_TABLET | Freq: Once | ORAL | Status: AC
  Administered 2017-10-30: 250 mg via ORAL
  Filled 2017-10-30: qty 1

## 2017-10-30 MED ORDER — FENTANYL CITRATE (PF) 100 MCG/2ML IJ SOLN
50.0000 ug | Freq: Once | INTRAMUSCULAR | Status: AC
  Administered 2017-10-30: 50 ug via INTRAVENOUS
  Filled 2017-10-30: qty 2

## 2017-10-30 MED ORDER — OXYCODONE-ACETAMINOPHEN 5-325 MG PO TABS: 1.0000 | ORAL_TABLET | Freq: Four times a day (QID) | ORAL | 0 refills | Status: DC | PRN

## 2017-10-30 MED ORDER — CIPROFLOXACIN HCL 500 MG PO TABS: 500.0000 mg | ORAL_TABLET | Freq: Every day | ORAL | 0 refills | Status: DC

## 2017-10-30 MED ORDER — METHOCARBAMOL 500 MG PO TABS: 500.0000 mg | ORAL_TABLET | Freq: Two times a day (BID) | ORAL | 0 refills | Status: DC

## 2017-10-30 NOTE — ED Notes (Signed)
ED Provider at bedside. 

## 2017-10-30 NOTE — ED Provider Notes (Signed)
[facility] EMERGENCY DEPARTMENT Provider Note   CSN: [id number] Arrival date & time: 10/29/17  1654     History   Chief Complaint Chief Complaint  Patient presents with  . Abdominal Pain    HPI [name] is a 77 y.o. male.  The history is provided by the patient. No language interpreter was used.  Abdominal Pain     [name] is a 77 y.o. male who presents to the Emergency Department complaining of abdominal pain.  He presents for evaluation of lower abdominal pain that is present across his lower abdomen and radiates to his groin and back.  Pain is been present for the last couple of days.  It is sharp and constant in nature.  It is worse with palpation and with ambulation.  He had a lumbar compression fracture about 3 weeks ago and had surgery for it at that time.  His back pain improved but over the last few days he does report increased left-sided as well as right-sided back pain, no midline back pain.  He denies any fevers, chest pain, nausea, vomiting.  He does endorse hard stools and constipation.  He makes minimal urine as he is a dialysis patient.  No prior similar symptoms. Past Medical History:  Diagnosis Date  . Allergy   . Anemia   . Arthritis   . Coronary artery disease   . Diabetes mellitus    Type 2  . Diverticulitis   . ED (erectile dysfunction)   . 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 ESRD (end stage renal disease) on dialysis ([place]) 03/2015  . GERD (gastroesophageal reflux disease)    pepto   . Gout   . Hyperlipidemia   . Hypertension   . Hypothyroidism   . Pneumonia   . PVD (peripheral vascular disease) ([place])    has plastic aorta  . Renal insufficiency   . Seasonal allergies   . Shortness of breath dyspnea   . Sleep apnea   . Thyroid disease     Patient Active Problem List   Diagnosis Date Noted  . Dyspnea   . Macrocytic anemia 06/04/2016  . Thrombocytopenia ([place]) 06/04/2016  . Exertional dyspnea 06/04/2016  . Arm  paresthesia, left 06/04/2016  . Dyspnea on exertion 06/04/2016  . Tenderness of right calf 06/04/2016  . Mild cognitive impairment with memory loss 01/12/2016  . ESRD on dialysis ([place]) 05/17/2015  . Hypoglycemia   . Weakness 02/18/2015  . OSA (obstructive sleep apnea) 02/02/2015  . Hyperkalemia 10/05/2014  . Chronic kidney disease (CKD), stage IV (severe) ([place]) 04/07/2014  . End stage renal disease ([place]) 11/18/2013  . BPH (benign prostatic hyperplasia) 11/22/2012  . Peripheral vascular disease ([place]) 12/24/2011  . Hyperparathyroidism ([place]) 12/24/2011  . Hypothyroidism 12/24/2011  . Allergic rhinitis 12/24/2011  . Erectile dysfunction 12/24/2011  . Insulin dependent diabetes mellitus ([place]) 09/03/2008  . Hyperlipidemia 09/03/2008  . Essential hypertension 08/30/2008  . PANCREATITIS, HX OF 08/30/2008  . RENAL FAILURE, ACUTE, HX OF 08/30/2008  . DIVERTICULOSIS, COLON 06/08/2003    Past Surgical History:  Procedure Laterality Date  . aortobifemoral bypass    . AV FISTULA PLACEMENT Left 12/01/2013   Procedure: ARTERIOVENOUS (AV) FISTULA CREATION- LEFT BRACHIOCEPHALIC;  Surgeon: Angelia Mould, MD;  Location: [facility];  Service: Vascular;  Laterality: Left;  . [place] TRANSPOSITION Right 07/27/2014   Procedure: BASCILIC VEIN TRANSPOSITION;  Surgeon: Angelia Mould, MD;  Location: [facility];  Service: Vascular;  Laterality: Right;  .  BREAST SURGERY     left - granulomatous mastitis  . COLONOSCOPY    . 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 EYE SURGERY Bilateral    cataracts  . REVISON OF ARTERIOVENOUS FISTULA Left 02/09/2014   Procedure: REVISON OF LEFT ARTERIOVENOUS FISTULA - RESECTION OF RENDUNDANT VEIN;  Surgeon: Angelia Mould, MD;  Location: [facility];  Service: Vascular;  Laterality: Left;  . SBO with lysis adhesions    . SHUNTOGRAM Left 04/19/2014   Procedure: FISTULOGRAM;  Surgeon: Angelia Mould, MD;  [facility] C[facility] LAB;  Service:  Cardiovascular;  Laterality: Left;  . UNILATERAL UPPER EXTREMEITY ANGIOGRAM N/A 07/12/2014   Procedure: UNILATERAL UPPER Anselmo Rod;  Surgeon: Angelia Mould, MD;  Location: [facility] CATH LAB;  Service: Cardiovascular;  Laterality: N/A;       Home Medications    Prior to Admission medications   Medication Sig Start Date End Date Taking? Authorizing Provider  acetaminophen (TYLENOL) 500 MG tablet Take 500 mg by mouth every 6 (six) hours as needed (for pain).     [provider]  acetaminophen-codeine (TYLENOL #3) 300-30 MG tablet take 1 tablet by mouth every 4 hours if needed for pain 08/15/16   [provider]  allopurinol (ZYLOPRIM) 100 MG tablet Take 100 mg by mouth daily.      [provider]  aspirin 325 MG tablet Take 650 mg by mouth daily.     [provider]  azithromycin (ZITHROMAX Z-PAK) 250 MG tablet One po daily x 5 days 01/29/17   Elby Showers, MD  Blood Glucose Monitoring Suppl (FREESTYLE FREEDOM LITE) w/Device KIT Use to check blood sugar 2 times per day dx code E11.65 03/08/16   Elayne Snare, MD  clobetasol (TEMOVATE) 0.05 % external solution apply to affected area ON THE SCALP UP TO 2 TIMES A DAY AS NEEDED...  (REFER TO PRESCRIPTION NOTES). 04/19/15   [provider]  donepezil (ARICEPT) 10 MG tablet Take 1 tablet (10 mg total) by mouth at bedtime. 01/12/16   Melvenia Beam, MD  FLUOCINOLONE ACETONIDE SCALP 0.01 % OIL apply to affected area ON SCALP EVERY NIGHT AT BEDTIME, [place] UPON AWAKENING 04/28/15   [provider]  fluticasone (CUTIVATE) 0.05 % cream apply to affected area ON FACE UP TO 2 TIMES A DAY AS NEEDED 04/19/15   [provider]  fluticasone (FLONASE) 50 MCG/ACT nasal spray Place 1 spray into both nostrils daily. Patient taking differently: Place 1 spray into both nostrils daily as needed for allergies.  10/08/14   Mikhail, Velta Addison, DO  glucose blood (ONETOUCH VERIO) test strip Use to check blood  sugar 2 times per day. Dx code E11.65 03/01/16   Elayne Snare, MD  insulin aspart (NOVOLOG) 100 UNIT/ML injection Inject 10 Units into the skin 3 (three) times daily before meals. Patient taking differently: Inject 10 Units into the skin See admin instructions. Two to three times a day before meals 01/20/16   Elayne Snare, MD  insulin glargine (LANTUS) 100 UNIT/ML injection Inject 0.08 mLs (8 Units total) into the skin every morning. Patient taking differently: Inject 13 Units into the skin every morning.  06/06/16   Ghimire, Henreitta Leber, MD  Insulin Pen Needle (NOVOFINE) 30G X 8 MM MISC Use to inject insulin 3 times daily 04/03/17   Elayne Snare, MD  ketoconazole (NIZORAL) 2 % shampoo Apply 1 application topically 2 (two) times a week. 03/31/15   Elby Showers, MD  Lancets (FREESTYLE) lancets Use as instructed to check blood sugar 2 times per day dx code E11.65 02/22/16   Elayne Snare, MD  levothyroxine (SYNTHROID, LEVOTHROID) 50 MCG tablet Take 1 tablet (50 mcg total) by mouth daily. 08/06/14   Elby Showers, MD  lidocaine-prilocaine (EMLA) cream Apply 1 application topically once. AS DIRECTED 03/29/15   [provider]  loratadine (CLARITIN) 10 MG tablet Take 10 mg by mouth daily as needed for allergies.     [provider]  metroNIDAZOLE (METROGEL) 0.75 % gel APPLY TO AFFECTED AREA ON SKIN  ON FACE TWICE DAILY 06/14/15   [provider]  mupirocin ointment (BACTROBAN) 2 % Use as directed to affected areas 09/04/16   Elby Showers, MD  Naftifine HCl (NAFTIN) 2 % CREA Apply to feet daily for fungal infection 09/12/15   Elby Showers, MD  nystatin-triamcinolone (MYCOLOG II) cream Apply 1 application topically 2 (two) times daily. 09/26/17   Elby Showers, MD  omeprazole (PRILOSEC) 40 MG capsule Take 1 capsule (40 mg total) by mouth daily. 10/17/17   [place], Amy S, PA-C  pioglitazone (ACTOS) 30 MG tablet take 1 tablet by mouth once daily 05/30/17   Elayne Snare, MD  promethazine  (PHENERGAN) 12.5 MG tablet Take 1 tablet (12.5 mg total) by mouth every 12 (twelve) hours as needed for nausea or vomiting. 08/14/17   Elby Showers, MD  RENVELA 800 MG tablet Take 800-2,400 mg by mouth See admin instructions. 1,600-2,400 mg with breakfast then 1,600 mg with lunch then 2,400 mg with dinner (evening meal) and 800 mg with each snack 07/27/14   [provider]  SENSIPAR 30 MG tablet Take 30 mg by mouth daily.  12/01/15   [provider]  simvastatin (ZOCOR) 20 MG tablet Take 20 mg by mouth at bedtime.     [provider]  traMADol (ULTRAM) 50 MG tablet Take 1 tablet (50 mg total) by mouth every 12 (twelve) hours as needed. 09/04/17   Elby Showers, MD  triamcinolone ointment (KENALOG) 0.1 % Apply 1 application topically 2 (two) times daily. Use between thighs    [provider]    Family History Family History  Problem Relation Age of Onset  . Aneurysm Mother   . Heart disease Father   . Stroke Father   . Hypertension Father   . Diabetes Father   . Dementia Neg Hx     Social History Social History   Tobacco Use  . Smoking status: Former Smoker    Last attempt to quit: 09/24/1994    Years since quitting: 23.1  . Smokeless tobacco: Never Used  Substance Use Topics  . Alcohol use: No    Alcohol/week: 0.0 oz    Comment: Quit Oct. 1977 ("somewhat heavy")  . Drug use: No     Allergies   Penicillins   Review of Systems Review of Systems  Gastrointestinal: Positive for abdominal pain.  All other systems reviewed and are negative.    Physical Exam Updated Vital Signs BP (!) 127/58 (BP Location: Left Wrist)   Pulse 68   Resp 18   SpO2 100%   Physical Exam  Constitutional: He is oriented to person, place, and time. He appears well-developed and well-nourished.  HENT:  Head: Normocephalic and atraumatic.  Cardiovascular: Normal rate and regular rhythm.  No murmur heard. Pulmonary/Chest: Effort normal and breath sounds  normal. No respiratory distress.  Abdominal: Soft. There is no rebound and no guarding.  Mild  generalized abdominal tenderness.  There is a midline abdominal wound with granulation tissue in the base.  Musculoskeletal: He exhibits no tenderness.  Trace pitting edema to bilateral lower extremities.  2+ DP pulses bilaterally  Neurological: He is alert and oriented to person, place, and time.  Skin: Skin is warm and dry.  Psychiatric: He has a normal mood and affect. His behavior is normal.  Nursing note and vitals reviewed.    ED Treatments / Results  Labs (all labs ordered are listed, but only abnormal results are displayed) Labs Reviewed  CBC - Abnormal; Notable for the following components:      Result Value   RBC 3.33 (*)    Hemoglobin 11.4 (*)    HCT 34.6 (*)    MCV 103.9 (*)    MCH 34.2 (*)    RDW 16.4 (*)    All other components within normal limits  COMPREHENSIVE METABOLIC PANEL - Abnormal; Notable for the following components:   Chloride 92 (*)    Glucose, Bld 202 (*)    BUN 29 (*)    Creatinine, Ser 6.03 (*)    Albumin 2.9 (*)    ALT 13 (*)    GFR calc non Af Amer 8 (*)    GFR calc Af Amer 9 (*)    Anion gap 20 (*)    All other components within normal limits  LIPASE, BLOOD  CBG MONITORING, ED  I-STAT CG4 LACTIC ACID, ED    EKG  EKG Interpretation None       Radiology No results found.  Procedures Procedures (including critical care time)  Medications Ordered in ED Medications  oxyCODONE-acetaminophen (PERCOCET/ROXICET) 5-325 MG per tablet 1 tablet (1 tablet Oral Given 10/29/17 2313)     Initial Impression / Assessment and Plan / ED Course  I have reviewed the triage vital signs and the nursing notes.  Pertinent labs & imaging results that were available during my care of the patient were reviewed by me and considered in my medical decision making (see chart for details).    Patient with history of end-stage kidney disease here for evaluation of  progressive lower abdominal pain.  He does have mild to moderate tenderness on examination with no peritoneal findings.  Given his history of vascular surgery CTA was obtained.  CTA with evidence of bladder thickening but no other acute findings.  Initial lactate is elevated at greater than 4.  There was difficulty in his initial blood draw did require multiple blood draws.  Elevated lactate is not consistent with sepsis but more likely due to hemolyzed specimen.  Patient care transferred pending urinalysis.  Plan to discharge home following UA and treat accordingly for possible cystitis. Final Clinical Impressions(s) / ED Diagnoses   Final diagnoses:  None    ED Discharge Orders    None       Quintella Reichert, MD 10/30/17 [phone]

## 2017-10-30 NOTE — ED Notes (Signed)
Pt stated he tried to go to the bathroom

## 2017-10-30 NOTE — ED Provider Notes (Signed)
Took over for patient at 8 AM.  Urine returned with concern for infection with too numerous to count white cells, red cells and leukocytes.  Urine was cultured.  Patient started on Cipro due to his severe penicillin allergy.  It was dosed daily for his dialysis.  Discussed with patient and his wife the results.  They will start taking the antibiotic to see if the pain resolves.  If the pain is persistent they will follow-up with her doctor to reevaluate if this is coming from the back.  Patient given pain medication and muscle relaxer.  Will call dialysis and try to get him in later today.   Blanchie Dessert, MD 10/30/17 1006

## 2017-10-30 NOTE — ED Notes (Signed)
Patient transported to CT 

## 2017-10-31 DIAGNOSIS — D509 Iron deficiency anemia, unspecified: Secondary | ICD-10-CM | POA: Diagnosis not present

## 2017-10-31 DIAGNOSIS — N2581 Secondary hyperparathyroidism of renal origin: Secondary | ICD-10-CM | POA: Diagnosis not present

## 2017-10-31 DIAGNOSIS — E1129 Type 2 diabetes mellitus with other diabetic kidney complication: Secondary | ICD-10-CM | POA: Diagnosis not present

## 2017-10-31 DIAGNOSIS — N186 End stage renal disease: Secondary | ICD-10-CM | POA: Diagnosis not present

## 2017-10-31 LAB — URINE CULTURE: Culture: NO GROWTH

## 2017-11-01 DIAGNOSIS — N186 End stage renal disease: Secondary | ICD-10-CM | POA: Diagnosis not present

## 2017-11-01 DIAGNOSIS — E1129 Type 2 diabetes mellitus with other diabetic kidney complication: Secondary | ICD-10-CM | POA: Diagnosis not present

## 2017-11-01 DIAGNOSIS — N2581 Secondary hyperparathyroidism of renal origin: Secondary | ICD-10-CM | POA: Diagnosis not present

## 2017-11-01 DIAGNOSIS — D509 Iron deficiency anemia, unspecified: Secondary | ICD-10-CM | POA: Diagnosis not present

## 2017-11-04 DIAGNOSIS — E1129 Type 2 diabetes mellitus with other diabetic kidney complication: Secondary | ICD-10-CM | POA: Diagnosis not present

## 2017-11-04 DIAGNOSIS — N2581 Secondary hyperparathyroidism of renal origin: Secondary | ICD-10-CM | POA: Diagnosis not present

## 2017-11-04 DIAGNOSIS — D509 Iron deficiency anemia, unspecified: Secondary | ICD-10-CM | POA: Diagnosis not present

## 2017-11-04 DIAGNOSIS — N186 End stage renal disease: Secondary | ICD-10-CM | POA: Diagnosis not present

## 2017-11-06 ENCOUNTER — Telehealth: Payer: Self-pay | Admitting: Internal Medicine

## 2017-11-06 DIAGNOSIS — E1129 Type 2 diabetes mellitus with other diabetic kidney complication: Secondary | ICD-10-CM | POA: Diagnosis not present

## 2017-11-06 DIAGNOSIS — D509 Iron deficiency anemia, unspecified: Secondary | ICD-10-CM | POA: Diagnosis not present

## 2017-11-06 DIAGNOSIS — N2581 Secondary hyperparathyroidism of renal origin: Secondary | ICD-10-CM | POA: Diagnosis not present

## 2017-11-06 DIAGNOSIS — N186 End stage renal disease: Secondary | ICD-10-CM | POA: Diagnosis not present

## 2017-11-06 NOTE — Telephone Encounter (Signed)
Patient called today stating that he failed to follow up with our office after going to the ED on 10/29/17.  He thought his wife called and made appointment.  Advised that we didn't have any appointment availability for tomorrow or Friday.  I could provide him with a follow up appointment for next week.  He then began saying that his back was hurting and he really needed to be seen tomorrow.  Advised that I just didn't have the availability.  I placed the call on hold and went to speak with Dr. Renold Genta.    Dr. Renold Genta spoke with patient.  She then came and asked me to call Dr. Sanda Klein office and see if we could get an appointment for the patient for tomorrow.  Stated that patient stated that he was to follow up with [place] and he was having back pain and she told him that he had had Vertebroplasty and a negative CT of the abdomen and Pelvis in the ED.  He was placed on Cipro for a UTI and he finishes that tomorrow.  She advised that he would need to be catheterized.  I called Dr. Sanda Klein office and they advised they do not cath.  They stated I needed to call Urology.  Patient see's Dr. Karsten Ro.  Spoke with Jenny Reichmann @ [phone] who stated that they are pretty much finished with seeing patients at this time of day.  She would speak with on call dr and she'll call the patient to see what is going on.  Spoke with Dr. Renold Genta to let her know what's going on.  She advised to call patient's wife to make her aware.    Spoke with wife, [name] and advised that we would like to see patient in the morning at 10:00 a.m.  Advised that he had called here this afternoon.  Also advised that I had called [facility] and that their office may have already called patient.  If not, will be calling patient.  That he is stating that he needs to be catheterized.  So, he may or may not need an appointment at the Urologist.  Dr. Renold Genta can advise more on that after seeing him in the morning.  She verbalized an understanding of our  conversation.  She will make sure that he is at the appointment in the morning.

## 2017-11-07 ENCOUNTER — Encounter: Payer: Self-pay | Admitting: Internal Medicine

## 2017-11-07 ENCOUNTER — Ambulatory Visit (INDEPENDENT_AMBULATORY_CARE_PROVIDER_SITE_OTHER): Payer: Medicare Other | Admitting: Internal Medicine

## 2017-11-07 VITALS — BP 84/60 | HR 73 | Ht 69.0 in | Wt 186.0 lb

## 2017-11-07 DIAGNOSIS — R531 Weakness: Secondary | ICD-10-CM | POA: Diagnosis not present

## 2017-11-07 DIAGNOSIS — R1030 Lower abdominal pain, unspecified: Secondary | ICD-10-CM | POA: Diagnosis not present

## 2017-11-07 DIAGNOSIS — N186 End stage renal disease: Secondary | ICD-10-CM

## 2017-11-07 DIAGNOSIS — E119 Type 2 diabetes mellitus without complications: Secondary | ICD-10-CM | POA: Diagnosis not present

## 2017-11-07 DIAGNOSIS — S22000A Wedge compression fracture of unspecified thoracic vertebra, initial encounter for closed fracture: Secondary | ICD-10-CM | POA: Diagnosis not present

## 2017-11-07 DIAGNOSIS — M545 Low back pain: Secondary | ICD-10-CM | POA: Diagnosis not present

## 2017-11-07 DIAGNOSIS — M549 Dorsalgia, unspecified: Secondary | ICD-10-CM

## 2017-11-07 DIAGNOSIS — M4800 Spinal stenosis, site unspecified: Secondary | ICD-10-CM

## 2017-11-07 DIAGNOSIS — N281 Cyst of kidney, acquired: Secondary | ICD-10-CM | POA: Diagnosis not present

## 2017-11-07 DIAGNOSIS — R3982 Chronic bladder pain: Secondary | ICD-10-CM | POA: Diagnosis not present

## 2017-11-07 DIAGNOSIS — Z992 Dependence on renal dialysis: Secondary | ICD-10-CM | POA: Diagnosis not present

## 2017-11-07 DIAGNOSIS — E1165 Type 2 diabetes mellitus with hyperglycemia: Secondary | ICD-10-CM | POA: Diagnosis not present

## 2017-11-07 LAB — POCT GLUCOSE (DEVICE FOR HOME USE): POC Glucose: 202 mg/dl — AB (ref 70–99)

## 2017-11-07 MED ORDER — TRAMADOL HCL 50 MG PO TABS: 50.0000 mg | ORAL_TABLET | Freq: Two times a day (BID) | ORAL | 0 refills | Status: DC | PRN

## 2017-11-07 NOTE — Patient Instructions (Signed)
To see if epidural steroids can be done by radiology for spinal stenosis. To have endoscopy and colonoscopy. SPEP drawn. To see neurosurgeon re spine.

## 2017-11-07 NOTE — Progress Notes (Signed)
   Subjective:    Patient ID: [name], male    DOB: 1940-11-18, 77 y.o.   MRN: [id number]  HPI 9 pound weight loss since early January.   He c/o of no appetite. Saw Urologist this am and had cystoscopy which was apparently OK acccording to pt. He c/o  of no appetite. Not sure if taking omeprazole. Scheduled for endoscopy and colonoscopy next Tuesday. Reviewed MRI LS spine with pt and wife. In addition to L3 compression fracture, has spinal stenosis at L4-L5. We will try to get epidural steroid injections through Radiology and Neurosugery referral. We are checking labs today. Was just seen in ED and had CT abd and pelvis which did not show any tumor. SPEP being drawn today. Hgb AIC was 7.7% in December.wants pain med. Given Tramadol refill #30 to take sparingly q 12 hours but must eat with that medication. C/o weakness. Has to be helped up from lying flat. C/o SOB when walking .   Review of Systems  see above- said he felt weak after leaving exam room and sat in waiting room. Brought him back to exam room. Accucheck was 202. Had only had protein shake for breakfast     Objective:   Physical Exam Chest clear. Abd: mild diffuse tenderness without rebound tenderness.Stright leg raising negative at 90 degrees and muscle strength is normal in legs. Pulse ox 97%       Assessment & Plan:  End stage kidney diease on dialysis  DM-- pt's  AIC stable in December at 7.7% followed by Dr. Dwyane Dee  Abdominal pain- etiology unclear--- to have endoscopy and colonoscopy next week. CT unremarkable  L4-L5  Severe spinal stenosis- try to get epidural steroids and Neurosurgery consultation. Hx L3 compression fracture treated with vertebroplasty  See if Appointment with Dr. Lorrene Reid can be moved up sooner. Wife is to call about that. He needs to eat better.

## 2017-11-07 NOTE — Addendum Note (Signed)
Addended by: Mady Haagensen on: 11/07/2017 01:26 PM   Modules accepted: Orders

## 2017-11-08 DIAGNOSIS — N186 End stage renal disease: Secondary | ICD-10-CM | POA: Diagnosis not present

## 2017-11-08 DIAGNOSIS — E1129 Type 2 diabetes mellitus with other diabetic kidney complication: Secondary | ICD-10-CM | POA: Diagnosis not present

## 2017-11-08 DIAGNOSIS — D509 Iron deficiency anemia, unspecified: Secondary | ICD-10-CM | POA: Diagnosis not present

## 2017-11-08 DIAGNOSIS — N2581 Secondary hyperparathyroidism of renal origin: Secondary | ICD-10-CM | POA: Diagnosis not present

## 2017-11-11 DIAGNOSIS — N2581 Secondary hyperparathyroidism of renal origin: Secondary | ICD-10-CM | POA: Diagnosis not present

## 2017-11-11 DIAGNOSIS — D509 Iron deficiency anemia, unspecified: Secondary | ICD-10-CM | POA: Diagnosis not present

## 2017-11-11 DIAGNOSIS — N186 End stage renal disease: Secondary | ICD-10-CM | POA: Diagnosis not present

## 2017-11-11 DIAGNOSIS — E1129 Type 2 diabetes mellitus with other diabetic kidney complication: Secondary | ICD-10-CM | POA: Diagnosis not present

## 2017-11-12 ENCOUNTER — Ambulatory Visit (AMBULATORY_SURGERY_CENTER): Payer: Medicare Other | Admitting: Gastroenterology

## 2017-11-12 ENCOUNTER — Encounter: Payer: Self-pay | Admitting: Gastroenterology

## 2017-11-12 ENCOUNTER — Other Ambulatory Visit: Payer: Self-pay

## 2017-11-12 VITALS — BP 123/34 | HR 79 | Temp 96.4°F | Resp 17 | Ht 71.0 in | Wt 189.0 lb

## 2017-11-12 DIAGNOSIS — D508 Other iron deficiency anemias: Secondary | ICD-10-CM

## 2017-11-12 DIAGNOSIS — R195 Other fecal abnormalities: Secondary | ICD-10-CM | POA: Diagnosis present

## 2017-11-12 DIAGNOSIS — D123 Benign neoplasm of transverse colon: Secondary | ICD-10-CM | POA: Diagnosis not present

## 2017-11-12 DIAGNOSIS — K3189 Other diseases of stomach and duodenum: Secondary | ICD-10-CM

## 2017-11-12 DIAGNOSIS — K635 Polyp of colon: Secondary | ICD-10-CM

## 2017-11-12 DIAGNOSIS — D127 Benign neoplasm of rectosigmoid junction: Secondary | ICD-10-CM | POA: Diagnosis not present

## 2017-11-12 DIAGNOSIS — D649 Anemia, unspecified: Secondary | ICD-10-CM | POA: Diagnosis not present

## 2017-11-12 LAB — COMPLETE METABOLIC PANEL WITH GFR
AG Ratio: 1.4 (calc) (ref 1.0–2.5)
ALT: 10 U/L (ref 9–46)
AST: 22 U/L (ref 10–35)
Albumin: 3.6 g/dL (ref 3.6–5.1)
Alkaline phosphatase (APISO): 96 U/L (ref 40–115)
BUN/Creatinine Ratio: 5 (calc) — ABNORMAL LOW (ref 6–22)
BUN: 24 mg/dL (ref 7–25)
CO2: 32 mmol/L (ref 20–32)
Calcium: 9.6 mg/dL (ref 8.6–10.3)
Chloride: 91 mmol/L — ABNORMAL LOW (ref 98–110)
Creat: 5.26 mg/dL — ABNORMAL HIGH (ref 0.70–1.18)
GFR, Est African American: 11 mL/min/{1.73_m2} — ABNORMAL LOW (ref >=60)
GFR, Est Non African American: 10 mL/min/{1.73_m2} — ABNORMAL LOW (ref >=60)
Globulin: 2.5 g/dL (calc) (ref 1.9–3.7)
Glucose, Bld: 155 mg/dL — ABNORMAL HIGH (ref 65–99)
Potassium: 4.3 mmol/L (ref 3.5–5.3)
Sodium: 140 mmol/L (ref 135–146)
Total Bilirubin: 0.4 mg/dL (ref 0.2–1.2)
Total Protein: 6.1 g/dL (ref 6.1–8.1)

## 2017-11-12 LAB — PROTEIN ELECTROPHORESIS, SERUM
Albumin ELP: 3.3 g/dL — ABNORMAL LOW (ref 3.8–4.8)
Alpha 1: 0.4 g/dL — ABNORMAL HIGH (ref 0.2–0.3)
Alpha 2: 1 g/dL — ABNORMAL HIGH (ref 0.5–0.9)
Beta 2: 0.4 g/dL (ref 0.2–0.5)
Beta Globulin: 0.3 g/dL — ABNORMAL LOW (ref 0.4–0.6)
Gamma Globulin: 0.8 g/dL (ref 0.8–1.7)
Total Protein: 6.3 g/dL (ref 6.1–8.1)

## 2017-11-12 LAB — CBC WITH DIFFERENTIAL/PLATELET
Basophils Absolute: 77 cells/uL (ref 0–200)
Basophils Relative: 0.8 %
Eosinophils Absolute: 240 cells/uL (ref 15–500)
Eosinophils Relative: 2.5 %
HCT: 35.2 % — ABNORMAL LOW (ref 38.5–50.0)
Hemoglobin: 11.6 g/dL — ABNORMAL LOW (ref 13.2–17.1)
Lymphs Abs: 2246 cells/uL (ref 850–3900)
MCH: 33.8 pg — ABNORMAL HIGH (ref 27.0–33.0)
MCHC: 33 g/dL (ref 32.0–36.0)
MCV: 102.6 fL — ABNORMAL HIGH (ref 80.0–100.0)
MPV: 10.1 fL (ref 7.5–12.5)
Monocytes Relative: 9.5 %
Neutro Abs: 6125 cells/uL (ref 1500–7800)
Neutrophils Relative %: 63.8 %
Platelets: 232 10*3/uL (ref 140–400)
RBC: 3.43 10*6/uL — ABNORMAL LOW (ref 4.20–5.80)
RDW: 15.1 % — ABNORMAL HIGH (ref 11.0–15.0)
Total Lymphocyte: 23.4 %
WBC mixed population: 912 cells/uL (ref 200–950)
WBC: 9.6 10*3/uL (ref 3.8–10.8)

## 2017-11-12 LAB — HEMOGLOBIN A1C
Hgb A1c MFr Bld: 6.2 % of total Hgb — ABNORMAL HIGH (ref <5.7)
Mean Plasma Glucose: 131 (calc)
eAG (mmol/L): 7.3 (calc)

## 2017-11-12 MED ORDER — DEXTROSE 5 % IV SOLN: Freq: Once | INTRAVENOUS | Status: DC

## 2017-11-12 MED ORDER — SODIUM CHLORIDE 0.9 % IV SOLN: 500.0000 mL | Freq: Once | INTRAVENOUS | Status: DC

## 2017-11-12 NOTE — Patient Instructions (Signed)
YOU HAD AN ENDOSCOPIC PROCEDURE TODAY AT [facility]:   Refer to the procedure report that was given to you for any specific questions about what was found during the examination.  If the procedure report does not answer your questions, please call your gastroenterologist to clarify.  If you requested that your care partner not be given the details of your procedure findings, then the procedure report has been included in a sealed envelope for you to review at your convenience later.  YOU SHOULD EXPECT: Some feelings of bloating in the abdomen. Passage of more gas than usual.  Walking can help get rid of the air that was put into your GI tract during the procedure and reduce the bloating. If you had a lower endoscopy (such as a colonoscopy or flexible sigmoidoscopy) you may notice spotting of blood in your stool or on the toilet paper. If you underwent a bowel prep for your procedure, you may not have a normal bowel movement for a few days.  Please Note:  You might notice some irritation and congestion in your nose or some drainage.  This is from the oxygen used during your procedure.  There is no need for concern and it should clear up in a day or so.  SYMPTOMS TO REPORT IMMEDIATELY:   Following lower endoscopy (colonoscopy or flexible sigmoidoscopy):  Excessive amounts of blood in the stool  Significant tenderness or worsening of abdominal pains  Swelling of the abdomen that is new, acute  Fever of 100F or higher   Following upper endoscopy (EGD)  Vomiting of blood or coffee ground material  New chest pain or pain under the shoulder blades  Painful or persistently difficult swallowing  New shortness of breath  Fever of 100F or higher  Black, tarry-looking stools  For urgent or emergent issues, a gastroenterologist can be reached at any hour by calling [phone].   DIET:  We do recommend a small meal at first, but then you may proceed to your regular diet.  Drink  plenty of fluids but you should avoid alcoholic beverages for 24 hours.  ACTIVITY:  You should plan to take it easy for the rest of today and you should NOT DRIVE or use heavy machinery until tomorrow (because of the sedation medicines used during the test).    FOLLOW UP: Our staff will call the number listed on your records the next business day following your procedure to check on you and address any questions or concerns that you may have regarding the information given to you following your procedure. If we do not reach you, we will leave a message.  However, if you are feeling well and you are not experiencing any problems, there is no need to return our call.  We will assume that you have returned to your regular daily activities without incident.  If any biopsies were taken you will be contacted by phone or by letter within the next 1-3 weeks.  Please call us at [phone] if you have not heard about the biopsies in 3 weeks.    SIGNATURES/CONFIDENTIALITY: You and/or your care partner have signed paperwork which will be entered into your electronic medical record.  These signatures attest to the fact that that the information above on your After Visit Summary has been reviewed and is understood.  Full responsibility of the confidentiality of this discharge information lies with you and/or your care-partner.   No ibuprofen,naproxen,or other non-steroidal anti-inflammatory drugs but resume remainder of  medications. Information given on polyps,diverticulosis and hemorrhoids.

## 2017-11-12 NOTE — Op Note (Signed)
[place] Patient Name: [name] Procedure Date: 11/12/2017 2:38 PM MRN: [id number] Endoscopist: Mauri Pole , MD Age: 77 Referring MD:  Date of Birth: Feb 19, 1941 Gender: Male Account #: [id number] Procedure:                Upper GI endoscopy Indications:              Suspected upper gastrointestinal bleeding in                            patient with unexplained iron deficiency anemia Medicines:                Monitored Anesthesia Care Procedure:                Pre-Anesthesia Assessment:                           - Prior to the procedure, a History and Physical                            was performed, and patient medications and                            allergies were reviewed. The patient's tolerance of                            previous anesthesia was also reviewed. The risks                            and benefits of the procedure and the sedation                            options and risks were discussed with the patient.                            All questions were answered, and informed consent                            was obtained. Prior Anticoagulants: The patient has                            taken no previous anticoagulant or antiplatelet                            agents. ASA Grade Assessment: III - A patient with                            severe systemic disease. After reviewing the risks                            and benefits, the patient was deemed in                            satisfactory condition to undergo the procedure.  After obtaining informed consent, the endoscope was                            passed under direct vision. Throughout the                            procedure, the patient's blood pressure, pulse, and                            oxygen saturations were monitored continuously. The                            Model GIF-HQ190 [id number]) scope was introduced                            through  the mouth, and advanced to the second part                            of duodenum. The upper GI endoscopy was                            accomplished without difficulty. The patient                            tolerated the procedure well. Scope In: Scope Out: Findings:                 One mild benign-appearing, intrinsic stenosis was                            found 37 to 38 cm from the incisors. This measured                            1.8 cm (inner diameter) x less than one cm (in                            length) and was traversed.                           Patchy mildly erythematous mucosa without bleeding                            was found in the entire examined stomach. Biopsies                            were taken with a cold forceps for Helicobacter                            pylori testing.                           The examined duodenum was normal. Complications:            No immediate complications. Estimated Blood Loss:     Estimated blood loss was minimal. Impression:               -  Benign-appearing esophageal stenosis.                           - Erythematous mucosa in the stomach. Biopsied.                           - Normal examined duodenum. Recommendation:           - Patient has a contact number available for                            emergencies. The signs and symptoms of potential                            delayed complications were discussed with the                            patient. Return to normal activities tomorrow.                            Written discharge instructions were provided to the                            patient.                           - Resume previous diet.                           - Continue present medications.                           - Await pathology results. Mauri Pole, MD 11/12/2017 3:09:49 PM This report has been signed electronically.

## 2017-11-12 NOTE — Progress Notes (Signed)
No sticks or b/p on right side sign hanging.  Pt has partial out of his mouth.  Pt request he have some sugar.  He said 69 was very low for him.  Thurston Pounds, RN reported this to Dr. Silverio Decamp and she okayed pt to D5W Iv then switch to 0.9% saline.  Cira Servant piggy backed d5w to 0.9% saline. Maw  Pt was a poor historian.  maw

## 2017-11-12 NOTE — Progress Notes (Signed)
Upon arrival to recovery pt. Blood pressure was low CRNA assessing blood pressure and administered additional medication for low pressure. Pt. Is alert and verbally responsive no acute distress noted but remains on monitor for assessment of blood pressure. Dr.Nadigam and Crna to assess order received to discharge pt.

## 2017-11-12 NOTE — Op Note (Signed)
[place] Patient Name: [name] Procedure Date: 11/12/2017 2:38 PM MRN: [id number] Endoscopist: Mauri Pole , MD Age: 77 Referring MD:  Date of Birth: 1941/07/21 Gender: Male Account #: [id number] Procedure:                Colonoscopy Indications:              Unexplained iron deficiency anemia Medicines:                Monitored Anesthesia Care Procedure:                Pre-Anesthesia Assessment:                           - Prior to the procedure, a History and Physical                            was performed, and patient medications and                            allergies were reviewed. The patient's tolerance of                            previous anesthesia was also reviewed. The risks                            and benefits of the procedure and the sedation                            options and risks were discussed with the patient.                            All questions were answered, and informed consent                            was obtained. Prior Anticoagulants: The patient has                            taken no previous anticoagulant or antiplatelet                            agents. ASA Grade Assessment: III - A patient with                            severe systemic disease. After reviewing the risks                            and benefits, the patient was deemed in                            satisfactory condition to undergo the procedure.                           After obtaining informed consent, the colonoscope  was passed under direct vision. Throughout the                            procedure, the patient's blood pressure, pulse, and                            oxygen saturations were monitored continuously. The                            Colonoscope was introduced through the anus and                            advanced to the the cecum, identified by                            appendiceal orifice and  ileocecal valve. The                            colonoscopy was performed without difficulty. The                            patient tolerated the procedure well. The quality                            of the bowel preparation was excellent. The                            ileocecal valve, appendiceal orifice, and rectum                            were photographed. Scope In: 2:48:21 PM Scope Out: 3:02:56 PM Scope Withdrawal Time: 0 hours 8 minutes 33 seconds  Total Procedure Duration: 0 hours 14 minutes 35 seconds  Findings:                 The perianal and digital rectal examinations were                            normal.                           A 9 mm polyp was found in the transverse colon. The                            polyp was sessile. The polyp was removed with a                            cold snare. Resection and retrieval were complete.                           A 1 mm polyp was found in the recto-sigmoid colon.                            The polyp was sessile. The polyp was removed with a  cold biopsy forceps. Resection and retrieval were                            complete.                           A single localized non-bleeding erosion was found                            in the transverse colon. No stigmata of recent                            bleeding were seen.                           A few small and large-mouthed diverticula were                            found in the sigmoid colon, descending colon,                            transverse colon, hepatic flexure and ascending                            colon.                           Non-bleeding internal hemorrhoids were found during                            retroflexion. The hemorrhoids were small. Complications:            No immediate complications. Estimated Blood Loss:     Estimated blood loss was minimal. Impression:               - One 9 mm polyp in the transverse colon,  removed                            with a cold snare. Resected and retrieved.                           - One 1 mm polyp at the recto-sigmoid colon,                            removed with a cold biopsy forceps. Resected and                            retrieved.                           - A single erosion in the transverse colon.                           - Mild to Moderate diverticulosis in the sigmoid  colon, in the descending colon, in the transverse                            colon, at the hepatic flexure and in the ascending                            colon.                           - Non-bleeding internal hemorrhoids. Recommendation:           - Patient has a contact number available for                            emergencies. The signs and symptoms of potential                            delayed complications were discussed with the                            patient. Return to normal activities tomorrow.                            Written discharge instructions were provided to the                            patient.                           - Resume previous diet.                           - Continue present medications.                           - Await pathology results.                           - No ibuprofen, naproxen, or other non-steroidal                            anti-inflammatory drugs.                           - No repeat colonoscopy due to age. Mauri Pole, MD 11/12/2017 3:14:16 PM This report has been signed electronically.

## 2017-11-12 NOTE — Progress Notes (Signed)
Called to room to assist during endoscopic procedure.  Patient ID and intended procedure confirmed with present staff. Received instructions for my participation in the procedure from the performing physician.  

## 2017-11-12 NOTE — Progress Notes (Signed)
Spontaneous respirations throughout. VSS. Resting comfortably. To PACU on room air. Report to RN.  15mg  Ephedrine given in PACU for BP 90/54.

## 2017-11-13 ENCOUNTER — Telehealth: Payer: Self-pay

## 2017-11-13 DIAGNOSIS — N186 End stage renal disease: Secondary | ICD-10-CM | POA: Diagnosis not present

## 2017-11-13 DIAGNOSIS — E1129 Type 2 diabetes mellitus with other diabetic kidney complication: Secondary | ICD-10-CM | POA: Diagnosis not present

## 2017-11-13 DIAGNOSIS — N2581 Secondary hyperparathyroidism of renal origin: Secondary | ICD-10-CM | POA: Diagnosis not present

## 2017-11-13 DIAGNOSIS — D509 Iron deficiency anemia, unspecified: Secondary | ICD-10-CM | POA: Diagnosis not present

## 2017-11-13 NOTE — Telephone Encounter (Signed)
  Follow up Call-  Call back number 11/12/2017  Post procedure Call Back phone  # [phone] hm  Permission to leave phone message Yes  Some recent data might be hidden     Patient questions:  Do you have a fever, pain , or abdominal swelling? No. Pain Score  0 *  Have you tolerated food without any problems? Yes.    Have you been able to return to your normal activities? Yes.    Do you have any questions about your discharge instructions: Diet   No. Medications  No. Follow up visit  No.  Do you have questions or concerns about your Care? No.  Actions: * If pain score is 4 or above: No action needed, pain <4.

## 2017-11-15 DIAGNOSIS — D509 Iron deficiency anemia, unspecified: Secondary | ICD-10-CM | POA: Diagnosis not present

## 2017-11-15 DIAGNOSIS — N186 End stage renal disease: Secondary | ICD-10-CM | POA: Diagnosis not present

## 2017-11-15 DIAGNOSIS — E1129 Type 2 diabetes mellitus with other diabetic kidney complication: Secondary | ICD-10-CM | POA: Diagnosis not present

## 2017-11-15 DIAGNOSIS — N2581 Secondary hyperparathyroidism of renal origin: Secondary | ICD-10-CM | POA: Diagnosis not present

## 2017-11-18 DIAGNOSIS — D509 Iron deficiency anemia, unspecified: Secondary | ICD-10-CM | POA: Diagnosis not present

## 2017-11-18 DIAGNOSIS — E1129 Type 2 diabetes mellitus with other diabetic kidney complication: Secondary | ICD-10-CM | POA: Diagnosis not present

## 2017-11-18 DIAGNOSIS — N186 End stage renal disease: Secondary | ICD-10-CM | POA: Diagnosis not present

## 2017-11-18 DIAGNOSIS — N2581 Secondary hyperparathyroidism of renal origin: Secondary | ICD-10-CM | POA: Diagnosis not present

## 2017-11-19 ENCOUNTER — Encounter: Payer: Self-pay | Admitting: Gastroenterology

## 2017-11-20 DIAGNOSIS — D509 Iron deficiency anemia, unspecified: Secondary | ICD-10-CM | POA: Diagnosis not present

## 2017-11-20 DIAGNOSIS — N186 End stage renal disease: Secondary | ICD-10-CM | POA: Diagnosis not present

## 2017-11-20 DIAGNOSIS — N2581 Secondary hyperparathyroidism of renal origin: Secondary | ICD-10-CM | POA: Diagnosis not present

## 2017-11-20 DIAGNOSIS — E1129 Type 2 diabetes mellitus with other diabetic kidney complication: Secondary | ICD-10-CM | POA: Diagnosis not present

## 2017-11-22 DIAGNOSIS — N186 End stage renal disease: Secondary | ICD-10-CM | POA: Diagnosis not present

## 2017-11-22 DIAGNOSIS — Z992 Dependence on renal dialysis: Secondary | ICD-10-CM | POA: Diagnosis not present

## 2017-11-22 DIAGNOSIS — D509 Iron deficiency anemia, unspecified: Secondary | ICD-10-CM | POA: Diagnosis not present

## 2017-11-22 DIAGNOSIS — N2581 Secondary hyperparathyroidism of renal origin: Secondary | ICD-10-CM | POA: Diagnosis not present

## 2017-11-22 DIAGNOSIS — D631 Anemia in chronic kidney disease: Secondary | ICD-10-CM | POA: Diagnosis not present

## 2017-11-22 DIAGNOSIS — E1129 Type 2 diabetes mellitus with other diabetic kidney complication: Secondary | ICD-10-CM | POA: Diagnosis not present

## 2017-11-22 DIAGNOSIS — E1122 Type 2 diabetes mellitus with diabetic chronic kidney disease: Secondary | ICD-10-CM | POA: Diagnosis not present

## 2017-11-25 DIAGNOSIS — D509 Iron deficiency anemia, unspecified: Secondary | ICD-10-CM | POA: Diagnosis not present

## 2017-11-25 DIAGNOSIS — D631 Anemia in chronic kidney disease: Secondary | ICD-10-CM | POA: Diagnosis not present

## 2017-11-25 DIAGNOSIS — N186 End stage renal disease: Secondary | ICD-10-CM | POA: Diagnosis not present

## 2017-11-25 DIAGNOSIS — N2581 Secondary hyperparathyroidism of renal origin: Secondary | ICD-10-CM | POA: Diagnosis not present

## 2017-11-25 DIAGNOSIS — E1129 Type 2 diabetes mellitus with other diabetic kidney complication: Secondary | ICD-10-CM | POA: Diagnosis not present

## 2017-11-27 DIAGNOSIS — E1129 Type 2 diabetes mellitus with other diabetic kidney complication: Secondary | ICD-10-CM | POA: Diagnosis not present

## 2017-11-27 DIAGNOSIS — N2581 Secondary hyperparathyroidism of renal origin: Secondary | ICD-10-CM | POA: Diagnosis not present

## 2017-11-27 DIAGNOSIS — D509 Iron deficiency anemia, unspecified: Secondary | ICD-10-CM | POA: Diagnosis not present

## 2017-11-27 DIAGNOSIS — N186 End stage renal disease: Secondary | ICD-10-CM | POA: Diagnosis not present

## 2017-11-27 DIAGNOSIS — D631 Anemia in chronic kidney disease: Secondary | ICD-10-CM | POA: Diagnosis not present

## 2017-11-29 DIAGNOSIS — N2581 Secondary hyperparathyroidism of renal origin: Secondary | ICD-10-CM | POA: Diagnosis not present

## 2017-11-29 DIAGNOSIS — D631 Anemia in chronic kidney disease: Secondary | ICD-10-CM | POA: Diagnosis not present

## 2017-11-29 DIAGNOSIS — D509 Iron deficiency anemia, unspecified: Secondary | ICD-10-CM | POA: Diagnosis not present

## 2017-11-29 DIAGNOSIS — N186 End stage renal disease: Secondary | ICD-10-CM | POA: Diagnosis not present

## 2017-11-29 DIAGNOSIS — E1129 Type 2 diabetes mellitus with other diabetic kidney complication: Secondary | ICD-10-CM | POA: Diagnosis not present

## 2017-12-02 DIAGNOSIS — D631 Anemia in chronic kidney disease: Secondary | ICD-10-CM | POA: Diagnosis not present

## 2017-12-02 DIAGNOSIS — D509 Iron deficiency anemia, unspecified: Secondary | ICD-10-CM | POA: Diagnosis not present

## 2017-12-02 DIAGNOSIS — N186 End stage renal disease: Secondary | ICD-10-CM | POA: Diagnosis not present

## 2017-12-02 DIAGNOSIS — N2581 Secondary hyperparathyroidism of renal origin: Secondary | ICD-10-CM | POA: Diagnosis not present

## 2017-12-02 DIAGNOSIS — E1129 Type 2 diabetes mellitus with other diabetic kidney complication: Secondary | ICD-10-CM | POA: Diagnosis not present

## 2017-12-04 DIAGNOSIS — D509 Iron deficiency anemia, unspecified: Secondary | ICD-10-CM | POA: Diagnosis not present

## 2017-12-04 DIAGNOSIS — N2581 Secondary hyperparathyroidism of renal origin: Secondary | ICD-10-CM | POA: Diagnosis not present

## 2017-12-04 DIAGNOSIS — N186 End stage renal disease: Secondary | ICD-10-CM | POA: Diagnosis not present

## 2017-12-04 DIAGNOSIS — E1129 Type 2 diabetes mellitus with other diabetic kidney complication: Secondary | ICD-10-CM | POA: Diagnosis not present

## 2017-12-04 DIAGNOSIS — D631 Anemia in chronic kidney disease: Secondary | ICD-10-CM | POA: Diagnosis not present

## 2017-12-06 DIAGNOSIS — E1129 Type 2 diabetes mellitus with other diabetic kidney complication: Secondary | ICD-10-CM | POA: Diagnosis not present

## 2017-12-06 DIAGNOSIS — N186 End stage renal disease: Secondary | ICD-10-CM | POA: Diagnosis not present

## 2017-12-06 DIAGNOSIS — N2581 Secondary hyperparathyroidism of renal origin: Secondary | ICD-10-CM | POA: Diagnosis not present

## 2017-12-06 DIAGNOSIS — D509 Iron deficiency anemia, unspecified: Secondary | ICD-10-CM | POA: Diagnosis not present

## 2017-12-06 DIAGNOSIS — D631 Anemia in chronic kidney disease: Secondary | ICD-10-CM | POA: Diagnosis not present

## 2017-12-09 DIAGNOSIS — E1129 Type 2 diabetes mellitus with other diabetic kidney complication: Secondary | ICD-10-CM | POA: Diagnosis not present

## 2017-12-09 DIAGNOSIS — N2581 Secondary hyperparathyroidism of renal origin: Secondary | ICD-10-CM | POA: Diagnosis not present

## 2017-12-09 DIAGNOSIS — N186 End stage renal disease: Secondary | ICD-10-CM | POA: Diagnosis not present

## 2017-12-09 DIAGNOSIS — D509 Iron deficiency anemia, unspecified: Secondary | ICD-10-CM | POA: Diagnosis not present

## 2017-12-09 DIAGNOSIS — D631 Anemia in chronic kidney disease: Secondary | ICD-10-CM | POA: Diagnosis not present

## 2017-12-10 ENCOUNTER — Other Ambulatory Visit: Payer: Medicare Other

## 2017-12-11 DIAGNOSIS — N186 End stage renal disease: Secondary | ICD-10-CM | POA: Diagnosis not present

## 2017-12-11 DIAGNOSIS — D631 Anemia in chronic kidney disease: Secondary | ICD-10-CM | POA: Diagnosis not present

## 2017-12-11 DIAGNOSIS — E1129 Type 2 diabetes mellitus with other diabetic kidney complication: Secondary | ICD-10-CM | POA: Diagnosis not present

## 2017-12-11 DIAGNOSIS — N2581 Secondary hyperparathyroidism of renal origin: Secondary | ICD-10-CM | POA: Diagnosis not present

## 2017-12-11 DIAGNOSIS — D509 Iron deficiency anemia, unspecified: Secondary | ICD-10-CM | POA: Diagnosis not present

## 2017-12-12 ENCOUNTER — Ambulatory Visit: Payer: Medicare Other | Admitting: Endocrinology

## 2017-12-13 DIAGNOSIS — E1129 Type 2 diabetes mellitus with other diabetic kidney complication: Secondary | ICD-10-CM | POA: Diagnosis not present

## 2017-12-13 DIAGNOSIS — N2581 Secondary hyperparathyroidism of renal origin: Secondary | ICD-10-CM | POA: Diagnosis not present

## 2017-12-13 DIAGNOSIS — N186 End stage renal disease: Secondary | ICD-10-CM | POA: Diagnosis not present

## 2017-12-13 DIAGNOSIS — D509 Iron deficiency anemia, unspecified: Secondary | ICD-10-CM | POA: Diagnosis not present

## 2017-12-13 DIAGNOSIS — D631 Anemia in chronic kidney disease: Secondary | ICD-10-CM | POA: Diagnosis not present

## 2017-12-16 DIAGNOSIS — N2581 Secondary hyperparathyroidism of renal origin: Secondary | ICD-10-CM | POA: Diagnosis not present

## 2017-12-16 DIAGNOSIS — D509 Iron deficiency anemia, unspecified: Secondary | ICD-10-CM | POA: Diagnosis not present

## 2017-12-16 DIAGNOSIS — N186 End stage renal disease: Secondary | ICD-10-CM | POA: Diagnosis not present

## 2017-12-16 DIAGNOSIS — E1129 Type 2 diabetes mellitus with other diabetic kidney complication: Secondary | ICD-10-CM | POA: Diagnosis not present

## 2017-12-16 DIAGNOSIS — D631 Anemia in chronic kidney disease: Secondary | ICD-10-CM | POA: Diagnosis not present

## 2017-12-17 ENCOUNTER — Inpatient Hospital Stay: Admission: RE | Admit: 2017-12-17 | Payer: Medicare Other | Source: Ambulatory Visit

## 2017-12-17 ENCOUNTER — Ambulatory Visit
Admission: RE | Admit: 2017-12-17 | Discharge: 2017-12-17 | Disposition: A | Discharge status: Home / Self Care | Payer: Medicare Other | Source: Ambulatory Visit | Attending: Internal Medicine | Admitting: Internal Medicine

## 2017-12-17 ENCOUNTER — Ambulatory Visit
Admission: RE | Admit: 2017-12-17 | Discharge: 2017-12-17 | Disposition: A | Discharge status: Home / Self Care | Payer: Medicare Other | Source: Ambulatory Visit

## 2017-12-17 ENCOUNTER — Ambulatory Visit: Admission: RE | Admit: 2017-12-17 | Payer: Medicare Other | Source: Ambulatory Visit

## 2017-12-17 DIAGNOSIS — S22000A Wedge compression fracture of unspecified thoracic vertebra, initial encounter for closed fracture: Secondary | ICD-10-CM

## 2017-12-17 DIAGNOSIS — M810 Age-related osteoporosis without current pathological fracture: Secondary | ICD-10-CM | POA: Diagnosis not present

## 2017-12-18 DIAGNOSIS — N186 End stage renal disease: Secondary | ICD-10-CM | POA: Diagnosis not present

## 2017-12-18 DIAGNOSIS — D631 Anemia in chronic kidney disease: Secondary | ICD-10-CM | POA: Diagnosis not present

## 2017-12-18 DIAGNOSIS — E1129 Type 2 diabetes mellitus with other diabetic kidney complication: Secondary | ICD-10-CM | POA: Diagnosis not present

## 2017-12-18 DIAGNOSIS — D509 Iron deficiency anemia, unspecified: Secondary | ICD-10-CM | POA: Diagnosis not present

## 2017-12-18 DIAGNOSIS — N2581 Secondary hyperparathyroidism of renal origin: Secondary | ICD-10-CM | POA: Diagnosis not present

## 2017-12-20 DIAGNOSIS — N2581 Secondary hyperparathyroidism of renal origin: Secondary | ICD-10-CM | POA: Diagnosis not present

## 2017-12-20 DIAGNOSIS — E1129 Type 2 diabetes mellitus with other diabetic kidney complication: Secondary | ICD-10-CM | POA: Diagnosis not present

## 2017-12-20 DIAGNOSIS — N186 End stage renal disease: Secondary | ICD-10-CM | POA: Diagnosis not present

## 2017-12-20 DIAGNOSIS — D509 Iron deficiency anemia, unspecified: Secondary | ICD-10-CM | POA: Diagnosis not present

## 2017-12-20 DIAGNOSIS — D631 Anemia in chronic kidney disease: Secondary | ICD-10-CM | POA: Diagnosis not present

## 2017-12-23 DIAGNOSIS — D631 Anemia in chronic kidney disease: Secondary | ICD-10-CM | POA: Diagnosis not present

## 2017-12-23 DIAGNOSIS — Z992 Dependence on renal dialysis: Secondary | ICD-10-CM | POA: Diagnosis not present

## 2017-12-23 DIAGNOSIS — D509 Iron deficiency anemia, unspecified: Secondary | ICD-10-CM | POA: Diagnosis not present

## 2017-12-23 DIAGNOSIS — E1122 Type 2 diabetes mellitus with diabetic chronic kidney disease: Secondary | ICD-10-CM | POA: Diagnosis not present

## 2017-12-23 DIAGNOSIS — M5416 Radiculopathy, lumbar region: Secondary | ICD-10-CM | POA: Diagnosis not present

## 2017-12-23 DIAGNOSIS — M545 Low back pain: Secondary | ICD-10-CM | POA: Diagnosis not present

## 2017-12-23 DIAGNOSIS — N2581 Secondary hyperparathyroidism of renal origin: Secondary | ICD-10-CM | POA: Diagnosis not present

## 2017-12-23 DIAGNOSIS — N186 End stage renal disease: Secondary | ICD-10-CM | POA: Diagnosis not present

## 2017-12-23 DIAGNOSIS — M8080XA Other osteoporosis with current pathological fracture, unspecified site, initial encounter for fracture: Secondary | ICD-10-CM | POA: Diagnosis not present

## 2017-12-23 DIAGNOSIS — E1129 Type 2 diabetes mellitus with other diabetic kidney complication: Secondary | ICD-10-CM | POA: Diagnosis not present

## 2017-12-23 DIAGNOSIS — M4316 Spondylolisthesis, lumbar region: Secondary | ICD-10-CM | POA: Diagnosis not present

## 2017-12-23 DIAGNOSIS — M48061 Spinal stenosis, lumbar region without neurogenic claudication: Secondary | ICD-10-CM | POA: Diagnosis not present

## 2017-12-25 DIAGNOSIS — M8080XA Other osteoporosis with current pathological fracture, unspecified site, initial encounter for fracture: Secondary | ICD-10-CM | POA: Diagnosis not present

## 2017-12-25 DIAGNOSIS — D509 Iron deficiency anemia, unspecified: Secondary | ICD-10-CM | POA: Diagnosis not present

## 2017-12-25 DIAGNOSIS — N186 End stage renal disease: Secondary | ICD-10-CM | POA: Diagnosis not present

## 2017-12-25 DIAGNOSIS — M5416 Radiculopathy, lumbar region: Secondary | ICD-10-CM | POA: Diagnosis not present

## 2017-12-25 DIAGNOSIS — M545 Low back pain: Secondary | ICD-10-CM | POA: Diagnosis not present

## 2017-12-25 DIAGNOSIS — D631 Anemia in chronic kidney disease: Secondary | ICD-10-CM | POA: Diagnosis not present

## 2017-12-25 DIAGNOSIS — N2581 Secondary hyperparathyroidism of renal origin: Secondary | ICD-10-CM | POA: Diagnosis not present

## 2017-12-25 DIAGNOSIS — M48061 Spinal stenosis, lumbar region without neurogenic claudication: Secondary | ICD-10-CM | POA: Diagnosis not present

## 2017-12-25 DIAGNOSIS — M4316 Spondylolisthesis, lumbar region: Secondary | ICD-10-CM | POA: Diagnosis not present

## 2017-12-25 DIAGNOSIS — E1129 Type 2 diabetes mellitus with other diabetic kidney complication: Secondary | ICD-10-CM | POA: Diagnosis not present

## 2017-12-26 DIAGNOSIS — L218 Other seborrheic dermatitis: Secondary | ICD-10-CM | POA: Diagnosis not present

## 2017-12-27 DIAGNOSIS — D509 Iron deficiency anemia, unspecified: Secondary | ICD-10-CM | POA: Diagnosis not present

## 2017-12-27 DIAGNOSIS — E1129 Type 2 diabetes mellitus with other diabetic kidney complication: Secondary | ICD-10-CM | POA: Diagnosis not present

## 2017-12-27 DIAGNOSIS — N186 End stage renal disease: Secondary | ICD-10-CM | POA: Diagnosis not present

## 2017-12-27 DIAGNOSIS — D631 Anemia in chronic kidney disease: Secondary | ICD-10-CM | POA: Diagnosis not present

## 2017-12-27 DIAGNOSIS — N2581 Secondary hyperparathyroidism of renal origin: Secondary | ICD-10-CM | POA: Diagnosis not present

## 2017-12-30 DIAGNOSIS — N2581 Secondary hyperparathyroidism of renal origin: Secondary | ICD-10-CM | POA: Diagnosis not present

## 2017-12-30 DIAGNOSIS — N186 End stage renal disease: Secondary | ICD-10-CM | POA: Diagnosis not present

## 2017-12-30 DIAGNOSIS — D631 Anemia in chronic kidney disease: Secondary | ICD-10-CM | POA: Diagnosis not present

## 2017-12-30 DIAGNOSIS — E1129 Type 2 diabetes mellitus with other diabetic kidney complication: Secondary | ICD-10-CM | POA: Diagnosis not present

## 2017-12-30 DIAGNOSIS — D509 Iron deficiency anemia, unspecified: Secondary | ICD-10-CM | POA: Diagnosis not present

## 2018-01-01 DIAGNOSIS — D509 Iron deficiency anemia, unspecified: Secondary | ICD-10-CM | POA: Diagnosis not present

## 2018-01-01 DIAGNOSIS — N186 End stage renal disease: Secondary | ICD-10-CM | POA: Diagnosis not present

## 2018-01-01 DIAGNOSIS — D631 Anemia in chronic kidney disease: Secondary | ICD-10-CM | POA: Diagnosis not present

## 2018-01-01 DIAGNOSIS — N2581 Secondary hyperparathyroidism of renal origin: Secondary | ICD-10-CM | POA: Diagnosis not present

## 2018-01-01 DIAGNOSIS — E1129 Type 2 diabetes mellitus with other diabetic kidney complication: Secondary | ICD-10-CM | POA: Diagnosis not present

## 2018-01-02 ENCOUNTER — Encounter (HOSPITAL_COMMUNITY): Payer: Self-pay

## 2018-01-02 ENCOUNTER — Emergency Department (HOSPITAL_COMMUNITY)
Admission: EM | Admit: 2018-01-02 | Discharge: 2018-01-02 | Disposition: A | Discharge status: Home / Self Care | Payer: Medicare Other | Attending: Emergency Medicine | Admitting: Emergency Medicine

## 2018-01-02 ENCOUNTER — Other Ambulatory Visit: Payer: Medicare Other

## 2018-01-02 ENCOUNTER — Other Ambulatory Visit: Payer: Self-pay

## 2018-01-02 ENCOUNTER — Emergency Department (HOSPITAL_COMMUNITY): Payer: Medicare Other

## 2018-01-02 DIAGNOSIS — Y92009 Unspecified place in unspecified non-institutional (private) residence as the place of occurrence of the external cause: Secondary | ICD-10-CM | POA: Insufficient documentation

## 2018-01-02 DIAGNOSIS — I120 Hypertensive chronic kidney disease with stage 5 chronic kidney disease or end stage renal disease: Secondary | ICD-10-CM | POA: Diagnosis not present

## 2018-01-02 DIAGNOSIS — Y999 Unspecified external cause status: Secondary | ICD-10-CM | POA: Diagnosis not present

## 2018-01-02 DIAGNOSIS — Y939 Activity, unspecified: Secondary | ICD-10-CM | POA: Diagnosis not present

## 2018-01-02 DIAGNOSIS — Z794 Long term (current) use of insulin: Secondary | ICD-10-CM | POA: Diagnosis not present

## 2018-01-02 DIAGNOSIS — M546 Pain in thoracic spine: Secondary | ICD-10-CM | POA: Diagnosis not present

## 2018-01-02 DIAGNOSIS — Z87891 Personal history of nicotine dependence: Secondary | ICD-10-CM | POA: Insufficient documentation

## 2018-01-02 DIAGNOSIS — E1122 Type 2 diabetes mellitus with diabetic chronic kidney disease: Secondary | ICD-10-CM | POA: Insufficient documentation

## 2018-01-02 DIAGNOSIS — Z992 Dependence on renal dialysis: Secondary | ICD-10-CM | POA: Insufficient documentation

## 2018-01-02 DIAGNOSIS — N186 End stage renal disease: Secondary | ICD-10-CM | POA: Diagnosis not present

## 2018-01-02 DIAGNOSIS — S32020A Wedge compression fracture of second lumbar vertebra, initial encounter for closed fracture: Secondary | ICD-10-CM | POA: Diagnosis not present

## 2018-01-02 DIAGNOSIS — W1830XA Fall on same level, unspecified, initial encounter: Secondary | ICD-10-CM | POA: Diagnosis not present

## 2018-01-02 DIAGNOSIS — R52 Pain, unspecified: Secondary | ICD-10-CM | POA: Diagnosis not present

## 2018-01-02 DIAGNOSIS — Z79899 Other long term (current) drug therapy: Secondary | ICD-10-CM | POA: Diagnosis not present

## 2018-01-02 DIAGNOSIS — R05 Cough: Secondary | ICD-10-CM | POA: Diagnosis not present

## 2018-01-02 DIAGNOSIS — M549 Dorsalgia, unspecified: Secondary | ICD-10-CM | POA: Diagnosis present

## 2018-01-02 DIAGNOSIS — M545 Low back pain: Secondary | ICD-10-CM | POA: Diagnosis not present

## 2018-01-02 DIAGNOSIS — I2510 Atherosclerotic heart disease of native coronary artery without angina pectoris: Secondary | ICD-10-CM | POA: Insufficient documentation

## 2018-01-02 DIAGNOSIS — R0602 Shortness of breath: Secondary | ICD-10-CM | POA: Diagnosis not present

## 2018-01-02 DIAGNOSIS — M5489 Other dorsalgia: Secondary | ICD-10-CM | POA: Diagnosis not present

## 2018-01-02 MED ORDER — OXYCODONE HCL 5 MG PO TABS: 5.0000 mg | ORAL_TABLET | ORAL | 0 refills | Status: DC | PRN

## 2018-01-02 MED ORDER — METHOCARBAMOL 500 MG PO TABS: 500.0000 mg | ORAL_TABLET | Freq: Every evening | ORAL | 0 refills | Status: DC | PRN

## 2018-01-02 MED ORDER — OXYCODONE-ACETAMINOPHEN 5-325 MG PO TABS
1.0000 | ORAL_TABLET | Freq: Once | ORAL | Status: AC
  Administered 2018-01-02: 1 via ORAL
  Filled 2018-01-02: qty 1

## 2018-01-02 MED ORDER — ONDANSETRON 4 MG PO TBDP
4.0000 mg | ORAL_TABLET | Freq: Once | ORAL | Status: AC
  Administered 2018-01-02: 4 mg via ORAL
  Filled 2018-01-02: qty 1

## 2018-01-02 MED ORDER — DOCUSATE SODIUM 100 MG PO CAPS: 100.0000 mg | ORAL_CAPSULE | Freq: Every day | ORAL | 0 refills | Status: DC

## 2018-01-02 NOTE — ED Provider Notes (Signed)
[facility] DEPT Provider Note   CSN: [id number] Arrival date & time: 01/02/18  1213     History   Chief Complaint Chief Complaint  Patient presents with  . Fall  . Cough    HPI [name] is a 77 y.o. male.  Chief complaint is fall, back pain.  HPI 77 year old male.  Previous L3, and L5 kyphoplasty for compression fractures.  Has had good results.  He fell at his house on Monday, 3 days ago.  He had some pain initially.  However he has had worsening over the last 24 hours after getting around going to dialysis yesterday.  No extremity symptoms.  No numbness, weakness, or radicular pain.  No change in bowel or bladder habits.  No retention or incontinence.  Has had a cough.  No fever.  No shortness of breath or chest pain.  States when he coughs it does worsen his pain.  Past Medical History:  Diagnosis Date  . Allergy   . Anemia   . Arthritis   . Cataract    bil cateracts removed  . Coronary artery disease   . Diabetes mellitus    Type 2  . Diverticulitis   . ED (erectile dysfunction)   . Elevated homocysteine ([place])   . ESRD (end stage renal disease) on dialysis ([place]) 03/2015  . GERD (gastroesophageal reflux disease)    pepto   . Gout   . Hyperlipidemia   . Hypertension   . Hypothyroidism   . Pneumonia   . PVD (peripheral vascular disease) ([place])    has plastic aorta  . Renal insufficiency   . Seasonal allergies   . Shortness of breath dyspnea   . Sleep apnea    does not wear c-pap  . Thyroid disease     Patient Active Problem List   Diagnosis Date Noted  . Dyspnea   . Macrocytic anemia 06/04/2016  . Thrombocytopenia ([place]) 06/04/2016  . Exertional dyspnea 06/04/2016  . Arm paresthesia, left 06/04/2016  . Dyspnea on exertion 06/04/2016  . Tenderness of right calf 06/04/2016  . Mild cognitive impairment with memory loss 01/12/2016  . ESRD on dialysis ([place]) 05/17/2015  . Hypoglycemia   . Weakness 02/18/2015  . OSA  (obstructive sleep apnea) 02/02/2015  . Hyperkalemia 10/05/2014  . Chronic kidney disease (CKD), stage IV (severe) ([place]) 04/07/2014  . End stage renal disease ([place]) 11/18/2013  . BPH (benign prostatic hyperplasia) 11/22/2012  . Peripheral vascular disease ([place]) 12/24/2011  . Hyperparathyroidism ([place]) 12/24/2011  . Hypothyroidism 12/24/2011  . Allergic rhinitis 12/24/2011  . Erectile dysfunction 12/24/2011  . Insulin dependent diabetes mellitus ([place]) 09/03/2008  . Hyperlipidemia 09/03/2008  . Essential hypertension 08/30/2008  . PANCREATITIS, HX OF 08/30/2008  . RENAL FAILURE, ACUTE, HX OF 08/30/2008  . DIVERTICULOSIS, COLON 06/08/2003    Past Surgical History:  Procedure Laterality Date  . aortobifemoral bypass    . AV FISTULA PLACEMENT Left 12/01/2013   Procedure: ARTERIOVENOUS (AV) FISTULA CREATION- LEFT BRACHIOCEPHALIC;  Surgeon: Angelia Mould, MD;  Location: [facility];  Service: Vascular;  Laterality: Left;  . [place] TRANSPOSITION Right 07/27/2014   Procedure: BASCILIC VEIN TRANSPOSITION;  Surgeon: Angelia Mould, MD;  Location: [facility];  Service: Vascular;  Laterality: Right;  . BREAST SURGERY     left - granulomatous mastitis  . COLONOSCOPY    . 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 EYE SURGERY Bilateral    cataracts  .  REVISON OF ARTERIOVENOUS FISTULA Left 02/09/2014   Procedure: REVISON OF LEFT ARTERIOVENOUS FISTULA - RESECTION OF RENDUNDANT VEIN;  Surgeon: Angelia Mould, MD;  Location: [facility];  Service: Vascular;  Laterality: Left;  . SBO with lysis adhesions    . SHUNTOGRAM Left 04/19/2014   Procedure: FISTULOGRAM;  Surgeon: Angelia Mould, MD;  Location: [facility] CATH LAB;  Service: Cardiovascular;  Laterality: Left;  . UNILATERAL UPPER EXTREMEITY ANGIOGRAM N/A 07/12/2014   Procedure: UNILATERAL UPPER Anselmo Rod;  Surgeon: Angelia Mould, MD;  Location: [facility] CATH LAB;  Service: Cardiovascular;  Laterality: N/A;         Home Medications    Prior to Admission medications   Medication Sig Start Date End Date Taking? Authorizing Provider  acetaminophen (TYLENOL) 500 MG tablet Take 1,000 mg by mouth 2 (two) times daily as needed (for pain).    Yes [provider]  allopurinol (ZYLOPRIM) 100 MG tablet Take 100 mg by mouth daily.     Yes [provider]  Ascorbic Acid (VITAMIN C PO) Take 1 tablet by mouth daily.   Yes [provider]  aspirin 325 MG tablet Take 650 mg by mouth daily.    Yes [provider]  B Complex-C-Zn-Folic Acid (DIALYVITE/ZINC) TABS Take 1 tablet by mouth daily. 10/17/17  Yes [provider]  Cholecalciferol (VITAMIN D PO) Take 1 tablet by mouth daily.   Yes [provider]  clobetasol (TEMOVATE) 0.05 % external solution apply to affected area ON THE SCALP UP TO 2 TIMES A DAY AS NEEDED...  (REFER TO PRESCRIPTION NOTES). 04/19/15  Yes [provider]  donepezil (ARICEPT) 10 MG tablet Take 1 tablet (10 mg total) by mouth at bedtime. 01/12/16  Yes Melvenia Beam, MD  Fluocinolone Acetonide Body 0.01 % OIL Apply 1 ml on the skin daily 12/26/17  Yes [provider]  [place] 0.01 % OIL apply to affected area ON SCALP EVERY NIGHT AT BEDTIME, [place] UPON AWAKENING 04/28/15  Yes [provider]  fluticasone (CUTIVATE) 0.05 % cream apply to affected area ON FACE UP TO 2 TIMES A DAY AS NEEDED 04/19/15  Yes [provider]  fluticasone (FLONASE) 50 MCG/ACT nasal spray Place 1 spray into both nostrils daily. Patient taking differently: Place 1 spray into both nostrils daily as needed for allergies.  10/08/14  Yes Mikhail, Clinical biochemist, DO  Hyprom-Naphaz-Polysorb-Zn Sulf (CLEAR EYES COMPLETE OP) Place 1 drop into both eyes 2 (two) times daily as needed (itchy eyes).   Yes [provider]  insulin aspart (NOVOLOG) 100 UNIT/ML injection Inject 10 Units into the skin 3 (three) times daily before  meals. Patient taking differently: Inject 10 Units into the skin See admin instructions. Two to three times a day before meals 01/20/16  Yes Elayne Snare, MD  insulin glargine (LANTUS) 100 UNIT/ML injection Inject 0.08 mLs (8 Units total) into the skin every morning. Patient taking differently: Inject 13 Units into the skin every morning.  06/06/16  Yes Ghimire, Henreitta Leber, MD  ketoconazole (NIZORAL) 2 % shampoo Apply 1 application topically 2 (two) times a week. 03/31/15  Yes Baxley, Cresenciano Lick, MD  levothyroxine (SYNTHROID, LEVOTHROID) 50 MCG tablet Take 1 tablet (50 mcg total) by mouth daily. 08/06/14  Yes Baxley, Cresenciano Lick, MD  lidocaine-prilocaine (EMLA) cream Apply 1 application topically once. AS DIRECTED 03/29/15  Yes [provider]  loratadine (CLARITIN) 10 MG tablet Take 10 mg by mouth daily as needed for allergies.  Yes [provider]  metroNIDAZOLE (METROGEL) 0.75 % gel APPLY TO AFFECTED AREA ON SKIN  ON FACE TWICE DAILY 06/14/15  Yes [provider]  Multiple Vitamins-Minerals (ZINC PO) Take 1 tablet by mouth daily.   Yes [provider]  mupirocin ointment (BACTROBAN) 2 % Use as directed to affected areas 09/04/16  Yes Baxley, Cresenciano Lick, MD  Naftifine HCl (NAFTIN) 2 % CREA Apply to feet daily for fungal infection 09/12/15  Yes Baxley, Cresenciano Lick, MD  nystatin-triamcinolone (MYCOLOG II) cream Apply 1 application topically 2 (two) times daily. 09/26/17  Yes Baxley, Cresenciano Lick, MD  omeprazole (PRILOSEC) 40 MG capsule Take 1 capsule (40 mg total) by mouth daily. 10/17/17  Yes Esterwood, Amy S, PA-C  pioglitazone (ACTOS) 30 MG tablet take 1 tablet by mouth once daily Patient taking differently: take 1 tablet (30 mg)  by mouth once daily 05/30/17  Yes Elayne Snare, MD  Probiotic Product (PROBIOTIC PO) Take 1 tablet by mouth daily.   Yes [provider]  RENVELA 800 MG tablet Take 800-2,400 mg by mouth See admin instructions. 1,600-2,400 mg with breakfast then 1,600 mg with  lunch then 2,400 mg with dinner (evening meal) and 800 mg with each snack 07/27/14  Yes [provider]  SENSIPAR 30 MG tablet Take 30 mg by mouth daily.  12/01/15  Yes [provider]  simvastatin (ZOCOR) 20 MG tablet Take 20 mg by mouth at bedtime.    Yes [provider]  traMADol (ULTRAM) 50 MG tablet Take 1 tablet (50 mg total) by mouth every 12 (twelve) hours as needed. 11/07/17  Yes Baxley, Cresenciano Lick, MD  triamcinolone ointment (KENALOG) 0.1 % Apply 1 application topically 2 (two) times daily. Use between thighs   Yes [provider]  acetaminophen-codeine (TYLENOL #3) 300-30 MG tablet take 1 tablet by mouth every 4 hours if needed for pain 08/15/16   [provider]  Blood Glucose Monitoring Suppl (FREESTYLE FREEDOM LITE) w/Device KIT Use to check blood sugar 2 times per day dx code E11.65 03/08/16   Elayne Snare, MD  ciprofloxacin (CIPRO) 500 MG tablet Take 1 tablet (500 mg total) by mouth daily. On dialysis days take after dialysis 10/30/17   Blanchie Dessert, MD  docusate sodium (COLACE) 100 MG capsule Take 1 capsule (100 mg total) by mouth daily. To avoid constipation from pain medication 01/02/18   Tanna Furry, MD  glucose blood [facility] VERIO) test strip Use to check blood sugar 2 times per day. Dx code E11.65 03/01/16   Elayne Snare, MD  Insulin Pen Needle (NOVOFINE) 30G X 8 MM MISC Use to inject insulin 3 times daily 04/03/17   Elayne Snare, MD  Lancets (FREESTYLE) lancets Use as instructed to check blood sugar 2 times per day dx code E11.65 02/22/16   Elayne Snare, MD  methocarbamol (ROBAXIN) 500 MG tablet Take 1 tablet (500 mg total) by mouth at bedtime as needed for muscle spasms. 01/02/18   Tanna Furry, MD  oxyCODONE (ROXICODONE) 5 MG immediate release tablet Take 1 tablet (5 mg total) by mouth every 4 (four) hours as needed for severe pain. 01/02/18   Tanna Furry, MD  oxyCODONE-acetaminophen (PERCOCET/ROXICET) 5-325 MG tablet Take 1-2 tablets by mouth every  6 (six) hours as needed for severe pain. Patient not taking: Reported on 01/02/2018 10/30/17   Blanchie Dessert, MD  promethazine (PHENERGAN) 12.5 MG tablet Take 1 tablet (12.5 mg total) by mouth every 12 (twelve) hours as needed for nausea or vomiting.  Patient not taking: Reported on 01/02/2018 08/14/17   Elby Showers, MD    Family History Family History  Problem Relation Age of Onset  . Aneurysm Mother   . Heart disease Father   . Stroke Father   . Hypertension Father   . Diabetes Father   . Dementia Neg Hx   . Colon cancer Neg Hx   . Esophageal cancer Neg Hx   . Pancreatic cancer Neg Hx   . Prostate cancer Neg Hx   . Rectal cancer Neg Hx   . Stomach cancer Neg Hx     Social History Social History   Tobacco Use  . Smoking status: Former Smoker    Last attempt to quit: 09/24/1994    Years since quitting: 23.2  . Smokeless tobacco: Never Used  Substance Use Topics  . Alcohol use: No    Alcohol/week: 0.0 oz    Comment: Quit Oct. 1977 ("somewhat heavy")  . Drug use: No     Allergies   Penicillins   Review of Systems Review of Systems  Constitutional: Negative for appetite change, chills, diaphoresis, fatigue and fever.  HENT: Negative for mouth sores, sore throat and trouble swallowing.   Eyes: Negative for visual disturbance.  Respiratory: Positive for cough. Negative for chest tightness, shortness of breath and wheezing.   Cardiovascular: Negative for chest pain.  Gastrointestinal: Negative for abdominal distention, abdominal pain, diarrhea, nausea and vomiting.  Endocrine: Negative for polydipsia, polyphagia and polyuria.  Genitourinary: Negative for dysuria, frequency and hematuria.  Musculoskeletal: Positive for arthralgias and back pain. Negative for gait problem.  Skin: Negative for color change, pallor and rash.  Neurological: Negative for dizziness, syncope, light-headedness and headaches.  Hematological: Does not bruise/bleed easily.   Psychiatric/Behavioral: Negative for behavioral problems and confusion.     Physical Exam Updated Vital Signs BP (!) 149/59   Pulse 76   Resp 16   Ht _0  (1.803 m)   Wt 82.1 kg (181 lb)   SpO2 93%   BMI 25.24 kg/m   Physical Exam  Constitutional: He is oriented to person, place, and time. He appears well-developed and well-nourished. No distress.  HENT:  Head: Normocephalic.  Eyes: Pupils are equal, round, and reactive to light. Conjunctivae are normal. No scleral icterus.  Neck: Normal range of motion. Neck supple. No thyromegaly present.  Cardiovascular: Normal rate and regular rhythm. Exam reveals no gallop and no friction rub.  No murmur heard. Pulmonary/Chest: Effort normal and breath sounds normal. No respiratory distress. He has no wheezes. He has no rales.  Abdominal: Soft. Bowel sounds are normal. He exhibits no distension. There is no tenderness. There is no rebound.  Musculoskeletal: Normal range of motion.  Diffuse tenderness to the lumbar paraspinal musculature right greater than left.  Neurological: He is alert and oriented to person, place, and time.  No focal areas of weakness to the lower extremities.  Normal flexion at the hip.  Normal flexion-extension at the knee.  Normal DTRs Achilles and knee jerk.  Normal sensation.  Skin: Skin is warm and dry. No rash noted.  Psychiatric: He has a normal mood and affect. His behavior is normal.     ED Treatments / Results  Labs (all labs ordered are listed, but only abnormal results are displayed) Labs Reviewed - No data to display  EKG None  Radiology Dg Chest 2 View  Result Date: 01/02/2018 CLINICAL DATA:  Cough with shortness of breath, history of fall EXAM: CHEST - 2 VIEW  COMPARISON:  01/29/2017 FINDINGS: Tiny pleural effusions. No focal airspace disease. Normal heart size. Aortic atherosclerosis. No pneumothorax. IMPRESSION: 1. Tiny pleural effusion. 2. No focal airspace disease or pneumothorax.  Electronically Signed   By: Donavan Foil M.D.   On: 01/02/2018 14:13   Dg Thoracic Spine 2 View  Result Date: 01/02/2018 CLINICAL DATA:  Back pain after fall. History of prior compression deformities and kyphoplasty. EXAM: THORACIC SPINE 2 VIEWS; LUMBAR SPINE - COMPLETE 4+ VIEW COMPARISON:  CT chest, abdomen, and pelvis dated October 30, 2017. FINDINGS: Evaluation is significantly limited due to cross-table positioning on the lateral views. The upper and midthoracic spine are not well evaluated. Thoracic spine: Twelve rib-bearing thoracic vertebral bodies. No definite acute fracture or subluxation. Lower thoracic vertebral body heights are preserved. Alignment is normal. Mild multilevel degenerative changes. Lumbar spine: Five lumbar type vertebral bodies. New mild superior endplate compression deformities of L1 and L2. Old compression deformities of L3 and L5 status post cement augmentation at L3. Unchanged grade 1 anterolisthesis at L4-L5. Mild multilevel degenerative disc disease and facet arthropathy, similar to prior study. The sacroiliac joints are intact. Diffuse osteopenia. Aortic atherosclerosis. IMPRESSION: 1. New mild superior endplate compression fractures of L1 and L2, likely acute given clinical history. 2. 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 3. Evaluation of the upper and midthoracic spine is limited due to cross-table positioning on the lateral views. No definite acute fracture in the thoracic spine. Electronically Signed   By: Titus Dubin M.D.   On: 01/02/2018 14:18   Dg Lumbar Spine Complete  Result Date: 01/02/2018 CLINICAL DATA:  Back pain after fall. History of prior compression deformities and kyphoplasty. EXAM: THORACIC SPINE 2 VIEWS; LUMBAR SPINE - COMPLETE 4+ VIEW COMPARISON:  CT chest, abdomen, and pelvis dated October 30, 2017. FINDINGS: Evaluation is significantly limited due to cross-table positioning on the lateral views. The upper and midthoracic spine  are not well evaluated. Thoracic spine: Twelve rib-bearing thoracic vertebral bodies. No definite acute fracture or subluxation. Lower thoracic vertebral body heights are preserved. Alignment is normal. Mild multilevel degenerative changes. Lumbar spine: Five lumbar type vertebral bodies. New mild superior endplate compression deformities of L1 and L2. Old compression deformities of L3 and L5 status post cement augmentation at L3. Unchanged grade 1 anterolisthesis at L4-L5. Mild multilevel degenerative disc disease and facet arthropathy, similar to prior study. The sacroiliac joints are intact. Diffuse osteopenia. Aortic atherosclerosis. IMPRESSION: 1. New mild superior endplate compression fractures of L1 and L2, likely acute given clinical history. 2. 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 3. Evaluation of the upper and midthoracic spine is limited due to cross-table positioning on the lateral views. No definite acute fracture in the thoracic spine. Electronically Signed   By: Titus Dubin M.D.   On: 01/02/2018 14:18    Procedures Procedures (including critical care time)  Medications Ordered in ED Medications  ondansetron (ZOFRAN-ODT) disintegrating tablet 4 mg (4 mg Oral Given 01/02/18 1320)  oxyCODONE-acetaminophen (PERCOCET/ROXICET) 5-325 MG per tablet 1 tablet (1 tablet Oral Given 01/02/18 1320)     Initial Impression / Assessment and Plan / ED Course  I have reviewed the triage vital signs and the nursing notes.  Pertinent labs & imaging results that were available during my care of the patient were reviewed by me and considered in my medical decision making (see chart for details).     X-rays, plain films show endplate disruption at L1 and L2 without frank compression.  Stable  kyphoplasty changes.  Patient was given oxycodone and Zofran upon arrival.  He is tolerating symptoms well states this is helped his pain considerably.  Plan is discharge home.  Nightly Robaxin as  he states some muscle spasm kept him up last night.  Otherwise Tylenol at home.  Oxycodone 5 mg to use sparingly and as needed when Tylenol is not controlling symptoms.  Colace to avoid opiate-induced constipation.  Primary care follow-up.  Final Clinical Impressions(s) / ED Diagnoses   Final diagnoses:  Closed compression fracture of second lumbar vertebra, initial encounter [facility])    ED Discharge Orders        Ordered    oxyCODONE (ROXICODONE) 5 MG immediate release tablet  Every 4 hours PRN     01/02/18 1505    docusate sodium (COLACE) 100 MG capsule  Daily     01/02/18 1505    methocarbamol (ROBAXIN) 500 MG tablet  At bedtime PRN     01/02/18 1505       Tanna Furry, MD 01/02/18 [phone]

## 2018-01-02 NOTE — ED Notes (Signed)
Bed: JQ73 Expected date:  Expected time:  Means of arrival:  Comments: EMS 77 yo recent fall

## 2018-01-02 NOTE — Discharge Instructions (Addendum)
Robaxin before bed to avoid muscle spasms. Colace daily to avoid constipation. Tylenol for mild pain. Oxycodone for pain not controlled with Tylenol.  May cause sedation.

## 2018-01-02 NOTE — ED Triage Notes (Addendum)
Fall Monday. Lost footing- fell backwards after missing step in garage (concrete floor). No LOC. Back pain associated. No leg shortening/rotion. Dialysis yesterday without complication. 135/82 HR 74 NSR RR 18 96% RA. 50 fent internasally.

## 2018-01-02 NOTE — ED Triage Notes (Signed)
Pt also states that he has a productive cough

## 2018-01-03 DIAGNOSIS — N186 End stage renal disease: Secondary | ICD-10-CM | POA: Diagnosis not present

## 2018-01-03 DIAGNOSIS — E1129 Type 2 diabetes mellitus with other diabetic kidney complication: Secondary | ICD-10-CM | POA: Diagnosis not present

## 2018-01-03 DIAGNOSIS — N2581 Secondary hyperparathyroidism of renal origin: Secondary | ICD-10-CM | POA: Diagnosis not present

## 2018-01-03 DIAGNOSIS — D509 Iron deficiency anemia, unspecified: Secondary | ICD-10-CM | POA: Diagnosis not present

## 2018-01-03 DIAGNOSIS — D631 Anemia in chronic kidney disease: Secondary | ICD-10-CM | POA: Diagnosis not present

## 2018-01-06 DIAGNOSIS — N186 End stage renal disease: Secondary | ICD-10-CM | POA: Diagnosis not present

## 2018-01-06 DIAGNOSIS — E1129 Type 2 diabetes mellitus with other diabetic kidney complication: Secondary | ICD-10-CM | POA: Diagnosis not present

## 2018-01-06 DIAGNOSIS — N2581 Secondary hyperparathyroidism of renal origin: Secondary | ICD-10-CM | POA: Diagnosis not present

## 2018-01-06 DIAGNOSIS — Z992 Dependence on renal dialysis: Secondary | ICD-10-CM | POA: Diagnosis not present

## 2018-01-06 DIAGNOSIS — T82858A Stenosis of vascular prosthetic devices, implants and grafts, initial encounter: Secondary | ICD-10-CM | POA: Diagnosis not present

## 2018-01-06 DIAGNOSIS — I871 Compression of vein: Secondary | ICD-10-CM | POA: Diagnosis not present

## 2018-01-06 DIAGNOSIS — D631 Anemia in chronic kidney disease: Secondary | ICD-10-CM | POA: Diagnosis not present

## 2018-01-06 DIAGNOSIS — D509 Iron deficiency anemia, unspecified: Secondary | ICD-10-CM | POA: Diagnosis not present

## 2018-01-07 ENCOUNTER — Other Ambulatory Visit (INDEPENDENT_AMBULATORY_CARE_PROVIDER_SITE_OTHER): Payer: Medicare Other

## 2018-01-07 DIAGNOSIS — Z794 Long term (current) use of insulin: Secondary | ICD-10-CM | POA: Diagnosis not present

## 2018-01-07 DIAGNOSIS — E1165 Type 2 diabetes mellitus with hyperglycemia: Secondary | ICD-10-CM | POA: Diagnosis not present

## 2018-01-07 LAB — GLUCOSE, RANDOM: Glucose, Bld: 69 mg/dL — ABNORMAL LOW (ref 70–99)

## 2018-01-07 LAB — HEMOGLOBIN A1C: Hgb A1c MFr Bld: 6.2 % (ref 4.6–6.5)

## 2018-01-08 DIAGNOSIS — D509 Iron deficiency anemia, unspecified: Secondary | ICD-10-CM | POA: Diagnosis not present

## 2018-01-08 DIAGNOSIS — N186 End stage renal disease: Secondary | ICD-10-CM | POA: Diagnosis not present

## 2018-01-08 DIAGNOSIS — D631 Anemia in chronic kidney disease: Secondary | ICD-10-CM | POA: Diagnosis not present

## 2018-01-08 DIAGNOSIS — N2581 Secondary hyperparathyroidism of renal origin: Secondary | ICD-10-CM | POA: Diagnosis not present

## 2018-01-08 DIAGNOSIS — E1129 Type 2 diabetes mellitus with other diabetic kidney complication: Secondary | ICD-10-CM | POA: Diagnosis not present

## 2018-01-09 ENCOUNTER — Encounter: Payer: Self-pay | Admitting: Endocrinology

## 2018-01-09 ENCOUNTER — Ambulatory Visit (INDEPENDENT_AMBULATORY_CARE_PROVIDER_SITE_OTHER): Payer: Medicare Other | Admitting: Endocrinology

## 2018-01-09 VITALS — BP 112/68 | HR 68 | Ht 71.0 in | Wt 177.2 lb

## 2018-01-09 DIAGNOSIS — E1165 Type 2 diabetes mellitus with hyperglycemia: Secondary | ICD-10-CM

## 2018-01-09 DIAGNOSIS — R634 Abnormal weight loss: Secondary | ICD-10-CM

## 2018-01-09 DIAGNOSIS — M8000XD Age-related osteoporosis with current pathological fracture, unspecified site, subsequent encounter for fracture with routine healing: Secondary | ICD-10-CM | POA: Diagnosis not present

## 2018-01-09 DIAGNOSIS — Z794 Long term (current) use of insulin: Secondary | ICD-10-CM | POA: Diagnosis not present

## 2018-01-09 MED ORDER — TERIPARATIDE (RECOMBINANT) 600 MCG/2.4ML ~~LOC~~ SOLN: 20.0000 ug | Freq: Every day | SUBCUTANEOUS | 1 refills | Status: DC

## 2018-01-09 NOTE — Progress Notes (Signed)
Patient ID: [name], male   DOB: March 28, 1941, 77 y.o.   MRN: [id number]           Reason for Appointment:  Follow-up for Type 2 Diabetes  Referring physician: [place]  History of Present Illness:          Date of diagnosis of type 2 diabetes mellitus:  1996      Background history:  He has had long-standing diabetes probably treated with metformin initially and subsequently with sulfonylurea drugs At some point he was also given Actos in addition to his glipizide which he is still taking Records of his control are only available for the last 4 years Over the last few years his A1c has been generally in the 6-7% range His A1c was 6.2 in May 2016 and apparently was starting to get low normal blood sugars at times He was admitted to the hospital for fluid overload and renal failure and at that time his Actos was stopped Subsequently his blood sugars had been markedly increased He was switched from regular insulin to NovoLog on his initial consultation  Recent history:    His A1c is better at 7.7 compared to 8.6 previously  INSULIN regimen is described as: Lantus 13 units in am; 10--? 5--10  units Novolog with meals with syringe  Dialysis is done at 7 am, doing it on Monday/Wednesday/Fridays     Current blood sugar patterns and problems identified:  He did  bring his monitor for download but again his readings are difficult to interpret because he has previous reading is overlapping with the wrong dates and has checked his blood sugar only about 3 times in the last month  Lab glucose was 69 in the early afternoon  He is not having much of an appetite and has lost weight, previously was eating more regularly and also snacking fair amount at dialysis time  However he is still taking the same amount of insulin  May be having some asymptomatic low blood sugars  Also not clear if his fasting readings are normal, has not checked in a while  Has only 1 reading later at night  which was 103  He is taking 13 units instead of 11 units of Lantus and again appears to arbitrarily change the dose on his own   Oral hypoglycemic drugs the patient is taking are:  on Actos 30 mg daily in a.m.     Side effects from medications have been: None  Compliance with the medical regimen: Good   Glucose monitoring:  done 0-2  times a day         Glucometer:  Freestyle  Blood Glucose readings by review of monitor as above    Self-care: The diet that the patient has been following is: tries to limit sweets, portions. eating out only about once a week at a steak house        Typical meal intake: Breakfast is variable; supper 8 pm            Dietician visit, most recent: 08/2017               Exercise:  Not able to do any  Weight history:  Wt Readings from Last 3 Encounters:  01/09/18 177 lb 3.2 oz (80.4 kg)  01/02/18 181 lb (82.1 kg)  11/12/17 189 lb (85.7 kg)    Glycemic control:    Lab Results  Component Value Date   HGBA1C 6.2 01/07/2018   HGBA1C 6.2 (H) 11/07/2017  HGBA1C 7.7 (H) 09/05/2017   Lab Results  Component Value Date   MICROALBUR 37.2 09/03/2016   LDLCALC 21 09/05/2017   CREATININE 5.26 (H) 11/07/2017    Lab Results  Component Value Date   FRUCTOSAMINE 454 (H) 07/12/2017   FRUCTOSAMINE 355 (H) 10/08/2016   FRUCTOSAMINE 407 (H) 08/29/2016   PROBLEM 2 Osteoporosis: See review of systems       Allergies as of 01/09/2018      Reactions   Penicillins Rash   Has patient had a PCN reaction causing immediate rash, facial/tongue/throat swelling, SOB or lightheadedness with hypotension: Yes Has patient had a PCN reaction causing severe rash involving mucus membranes or skin necrosis: Yes Has patient had a PCN reaction that required hospitalization: No Has patient had a PCN reaction occurring within the last 10 years: No If all of the above answers are "NO", then may proceed with Cephalosporin use.      Medication List        Accurate as  of 01/09/18 11:35 AM. Always use your most recent med list.          acetaminophen 500 MG tablet Commonly known as:  TYLENOL Take 1,000 mg by mouth 2 (two) times daily as needed (for pain).   acetaminophen-codeine 300-30 MG tablet Commonly known as:  TYLENOL #3 take 1 tablet by mouth every 4 hours if needed for pain   allopurinol 100 MG tablet Commonly known as:  ZYLOPRIM Take 100 mg by mouth daily.   aspirin 325 MG tablet Take 650 mg by mouth daily.   ciprofloxacin 500 MG tablet Commonly known as:  CIPRO Take 1 tablet (500 mg total) by mouth daily. On dialysis days take after dialysis   CLEAR EYES COMPLETE OP Place 1 drop into both eyes 2 (two) times daily as needed (itchy eyes).   clobetasol 0.05 % external solution Commonly known as:  TEMOVATE apply to affected area ON THE SCALP UP TO 2 TIMES A DAY AS NEEDED...  (REFER TO PRESCRIPTION NOTES).   DIALYVITE/ZINC Tabs Take 1 tablet by mouth daily.   docusate sodium 100 MG capsule Commonly known as:  COLACE Take 1 capsule (100 mg total) by mouth daily. To avoid constipation from pain medication   donepezil 10 MG tablet Commonly known as:  ARICEPT Take 1 tablet (10 mg total) by mouth at bedtime.   Fluocinolone Acetonide Scalp 0.01 % Oil apply to affected area ON SCALP EVERY NIGHT AT BEDTIME, WASH UPON AWAKENING   Fluocinolone Acetonide Body 0.01 % Oil Apply 1 ml on the skin daily   fluticasone 0.05 % cream Commonly known as:  CUTIVATE apply to affected area ON FACE UP TO 2 TIMES A DAY AS NEEDED   fluticasone 50 MCG/ACT nasal spray Commonly known as:  FLONASE Place 1 spray into both nostrils daily.   FREESTYLE FREEDOM LITE w/Device Kit Use to check blood sugar 2 times per day dx code E11.65   freestyle lancets Use as instructed to check blood sugar 2 times per day dx code E11.65   glucose blood test strip Commonly known as:  ONETOUCH VERIO Use to check blood sugar 2 times per day. Dx code E11.65   insulin  aspart 100 UNIT/ML injection Commonly known as:  novoLOG Inject 10 Units into the skin 3 (three) times daily before meals.   insulin glargine 100 UNIT/ML injection Commonly known as:  LANTUS Inject 0.08 mLs (8 Units total) into the skin every morning.   Insulin Pen Needle 30G X  8 MM Misc Commonly known as:  NOVOFINE Use to inject insulin 3 times daily   ketoconazole 2 % shampoo Commonly known as:  NIZORAL Apply 1 application topically 2 (two) times a week.   levothyroxine 50 MCG tablet Commonly known as:  SYNTHROID, LEVOTHROID Take 1 tablet (50 mcg total) by mouth daily.   lidocaine-prilocaine cream Commonly known as:  EMLA Apply 1 application topically once. AS DIRECTED   loratadine 10 MG tablet Commonly known as:  CLARITIN Take 10 mg by mouth daily as needed for allergies.   methocarbamol 500 MG tablet Commonly known as:  ROBAXIN Take 1 tablet (500 mg total) by mouth at bedtime as needed for muscle spasms.   metroNIDAZOLE 0.75 % gel Commonly known as:  METROGEL APPLY TO AFFECTED AREA ON SKIN  ON FACE TWICE DAILY   mupirocin ointment 2 % Commonly known as:  BACTROBAN Use as directed to affected areas   Naftifine HCl 2 % Crea Commonly known as:  NAFTIN Apply to feet daily for fungal infection   nystatin-triamcinolone cream Commonly known as:  MYCOLOG II Apply 1 application topically 2 (two) times daily.   omeprazole 40 MG capsule Commonly known as:  PRILOSEC Take 1 capsule (40 mg total) by mouth daily.   oxyCODONE 5 MG immediate release tablet Commonly known as:  ROXICODONE Take 1 tablet (5 mg total) by mouth every 4 (four) hours as needed for severe pain.   oxyCODONE-acetaminophen 5-325 MG tablet Commonly known as:  PERCOCET/ROXICET Take 1-2 tablets by mouth every 6 (six) hours as needed for severe pain.   pioglitazone 30 MG tablet Commonly known as:  ACTOS take 1 tablet by mouth once daily   PROBIOTIC PO Take 1 tablet by mouth daily.   promethazine  12.5 MG tablet Commonly known as:  PHENERGAN Take 1 tablet (12.5 mg total) by mouth every 12 (twelve) hours as needed for nausea or vomiting.   RENVELA 800 MG tablet Generic drug:  sevelamer carbonate Take 800-2,400 mg by mouth See admin instructions. 1,600-2,400 mg with breakfast then 1,600 mg with lunch then 2,400 mg with dinner (evening meal) and 800 mg with each snack   SENSIPAR 30 MG tablet Generic drug:  cinacalcet Take 30 mg by mouth daily.   simvastatin 20 MG tablet Commonly known as:  ZOCOR Take 20 mg by mouth at bedtime.   traMADol 50 MG tablet Commonly known as:  ULTRAM Take 1 tablet (50 mg total) by mouth every 12 (twelve) hours as needed.   triamcinolone ointment 0.1 % Commonly known as:  KENALOG Apply 1 application topically 2 (two) times daily. Use between thighs   VITAMIN C PO Take 1 tablet by mouth daily.   VITAMIN D PO Take 1 tablet by mouth daily.   ZINC PO Take 1 tablet by mouth daily.       Allergies:  Allergies  Allergen Reactions  . Penicillins Rash    Has patient had a PCN reaction causing immediate rash, facial/tongue/throat swelling, SOB or lightheadedness with hypotension: Yes Has patient had a PCN reaction causing severe rash involving mucus membranes or skin necrosis: Yes Has patient had a PCN reaction that required hospitalization: No Has patient had a PCN reaction occurring within the last 10 years: No If all of the above answers are "NO", then may proceed with Cephalosporin use.     Past Medical History:  Diagnosis Date  . Allergy   . Anemia   . Arthritis   . Cataract    bil  cateracts removed  . Coronary artery disease   . Diabetes mellitus    Type 2  . Diverticulitis   . ED (erectile dysfunction)   . 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 ESRD (end stage renal disease) on dialysis ([place]) 03/2015  . GERD (gastroesophageal reflux disease)    pepto   . Gout   . Hyperlipidemia   . Hypertension   . Hypothyroidism   . Pneumonia     . PVD (peripheral vascular disease) ([place])    has plastic aorta  . Renal insufficiency   . Seasonal allergies   . Shortness of breath dyspnea   . Sleep apnea    does not wear c-pap  . Thyroid disease     Past Surgical History:  Procedure Laterality Date  . aortobifemoral bypass    . AV FISTULA PLACEMENT Left 12/01/2013   Procedure: ARTERIOVENOUS (AV) FISTULA CREATION- LEFT BRACHIOCEPHALIC;  Surgeon: Angelia Mould, MD;  Location: [facility];  Service: Vascular;  Laterality: Left;  . [place] TRANSPOSITION Right 07/27/2014   Procedure: BASCILIC VEIN TRANSPOSITION;  Surgeon: Angelia Mould, MD;  Location: [facility];  Service: Vascular;  Laterality: Right;  . BREAST SURGERY     left - granulomatous mastitis  . COLONOSCOPY    . 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 EYE SURGERY Bilateral    cataracts  . REVISON OF ARTERIOVENOUS FISTULA Left 02/09/2014   Procedure: REVISON OF LEFT ARTERIOVENOUS FISTULA - RESECTION OF RENDUNDANT VEIN;  Surgeon: Angelia Mould, MD;  Location: [facility];  Service: Vascular;  Laterality: Left;  . SBO with lysis adhesions    . SHUNTOGRAM Left 04/19/2014   Procedure: FISTULOGRAM;  Surgeon: Angelia Mould, MD;  Location[facility] C[facility] LAB;  Service: Cardiovascular;  Laterality: Left;  . UNILATERAL UPPER EXTREMEITY ANGIOGRAM N/A 07/12/2014   Procedure: UNILATERAL UPPER Anselmo Rod;  Surgeon: Angelia Mould, MD;  Location: [facility] CATH LAB;  Service: Cardiovascular;  Laterality: N/A;    Family History  Problem Relation Age of Onset  . Aneurysm Mother   . Heart disease Father   . Stroke Father   . Hypertension Father   . Diabetes Father   . Dementia Neg Hx   . Colon cancer Neg Hx   . Esophageal cancer Neg Hx   . Pancreatic cancer Neg Hx   . Prostate cancer Neg Hx   . Rectal cancer Neg Hx   . Stomach cancer Neg Hx     Social History:  reports that he quit smoking about 23 years ago. He has never used smokeless  tobacco. He reports that he does not drink alcohol or use drugs.    Review of Systems      OSTEOPOROSIS:  He has had vertebral fractures twice and still having some residual back pain He is also tending to fall His T score at the radius was -4.6 and unable to read his hips and spine on the bone densitometry Currently not on any treatment and has not been recommended any by his nephrologist as far as he can tell He is reportedly taking Sensipar for secondary hyperparathyroidism but no recent PTH level available  Lab Results  Component Value Date   PTH 154 (H) 10/06/2014   CALCIUM 9.6 11/07/2017   CAION 1.44 (H) 07/12/2014   PHOS 4.2 01/10/2016     Lipid history: He has been treated with Simvastatin 20 mg  by PCP, previously on Atorvastatin    Lab Results  Component Value Date   CHOL 79 09/05/2017   HDL 38.00 (L) 09/05/2017   LDLCALC 21 09/05/2017   TRIG 99.0 09/05/2017   CHOLHDL 2 09/05/2017          He has ESRD on dialysis    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 Glucose, Bld 01/07/2018 69* 70 - 99 mg/dL Final  . Hgb A1c MFr Bld 01/07/2018 6.2  4.6 - 6.5 % Final   Glycemic Control Guidelines for People with Diabetes:Non Diabetic:  <6%Goal of Therapy: <7%Additional Action Suggested:  >8%     Physical Examination:  BP 112/68 (BP Location: Left Arm, Patient Position: Sitting, Cuff Size: Normal)   Pulse 68   Ht 5' 11"  (1.803 m)   Wt 177 lb 3.2 oz (80.4 kg)   SpO2 96%   BMI 24.71 kg/m       ASSESSMENT:  Diabetes type 2 on insulin See history of present illness for detailed discussion of his current management, blood sugar patterns and problems identified  His A1c is 7.7  His blood sugars appear to be somewhat better control This is likely to be  from his improving his diet with cutting back on snacks especially while dialysis Also benefiting from consultation with dietitian Since he did not check his blood sugars much lately difficult to know what his blood sugar patterns are and cannot make any adjustments in his insulin regimen Appears to have relatively good readings in the last few days both fasting and midday but only once high at noon  Still needing relatively low doses of basal insulin with relatively good fasting readings  OSTEOPOROSIS with multiple vertebral fractures and T score of -4.6: He has had management of his secondary hyperparathyroidism by his nephrologist but continues to have a low bone density  He does need to be on pharmacological treatment and also evaluation of his testosterone as a secondary factor   PLAN:  He needs to start checking his blood sugars at least once a day and on a daily basis after meals He has to check at least fasting blood sugar every other day to help adjust his Lantus He will STOP Actos since he is having relatively lower blood sugars recently but also not clear this may be detrimental to his osteoporosis He will reduce his NovoLog to 8 units twice a day He needs to let us know if he has any low sugars Forteo 20 mcg daily for osteoporosis if we can get this approved, this appears to be covered compared to Tymlos  We will also check his PTH and testosterone on the next visit  There are no Patient Instructions on file for this visit.        Elayne Snare 01/09/2018, 11:35 AM   Note: This office note was prepared with Dragon voice recognition system technology. Any transcriptional errors that result from this process are unintentional.

## 2018-01-09 NOTE — Patient Instructions (Addendum)
Stop Actos  Check sugar 4x daily  Novolog 8 units at Bfst and 8 supper

## 2018-01-10 DIAGNOSIS — D631 Anemia in chronic kidney disease: Secondary | ICD-10-CM | POA: Diagnosis not present

## 2018-01-10 DIAGNOSIS — D509 Iron deficiency anemia, unspecified: Secondary | ICD-10-CM | POA: Diagnosis not present

## 2018-01-10 DIAGNOSIS — N2581 Secondary hyperparathyroidism of renal origin: Secondary | ICD-10-CM | POA: Diagnosis not present

## 2018-01-10 DIAGNOSIS — E1129 Type 2 diabetes mellitus with other diabetic kidney complication: Secondary | ICD-10-CM | POA: Diagnosis not present

## 2018-01-10 DIAGNOSIS — N186 End stage renal disease: Secondary | ICD-10-CM | POA: Diagnosis not present

## 2018-01-12 ENCOUNTER — Encounter (HOSPITAL_COMMUNITY): Payer: Self-pay | Admitting: Emergency Medicine

## 2018-01-12 ENCOUNTER — Emergency Department (HOSPITAL_COMMUNITY)
Admission: EM | Admit: 2018-01-12 | Discharge: 2018-01-13 | Disposition: A | Discharge status: Home / Self Care | Payer: Medicare Other | Attending: Emergency Medicine | Admitting: Emergency Medicine

## 2018-01-12 ENCOUNTER — Emergency Department (HOSPITAL_COMMUNITY): Payer: Medicare Other

## 2018-01-12 DIAGNOSIS — N390 Urinary tract infection, site not specified: Secondary | ICD-10-CM | POA: Insufficient documentation

## 2018-01-12 DIAGNOSIS — N186 End stage renal disease: Secondary | ICD-10-CM | POA: Insufficient documentation

## 2018-01-12 DIAGNOSIS — I120 Hypertensive chronic kidney disease with stage 5 chronic kidney disease or end stage renal disease: Secondary | ICD-10-CM | POA: Diagnosis not present

## 2018-01-12 DIAGNOSIS — Z79899 Other long term (current) drug therapy: Secondary | ICD-10-CM | POA: Diagnosis not present

## 2018-01-12 DIAGNOSIS — R0602 Shortness of breath: Secondary | ICD-10-CM | POA: Diagnosis not present

## 2018-01-12 DIAGNOSIS — E162 Hypoglycemia, unspecified: Secondary | ICD-10-CM | POA: Diagnosis not present

## 2018-01-12 DIAGNOSIS — N3000 Acute cystitis without hematuria: Secondary | ICD-10-CM | POA: Diagnosis not present

## 2018-01-12 DIAGNOSIS — Z7982 Long term (current) use of aspirin: Secondary | ICD-10-CM | POA: Diagnosis not present

## 2018-01-12 DIAGNOSIS — E11649 Type 2 diabetes mellitus with hypoglycemia without coma: Secondary | ICD-10-CM | POA: Diagnosis not present

## 2018-01-12 DIAGNOSIS — R079 Chest pain, unspecified: Secondary | ICD-10-CM | POA: Diagnosis not present

## 2018-01-12 DIAGNOSIS — Z87891 Personal history of nicotine dependence: Secondary | ICD-10-CM | POA: Diagnosis not present

## 2018-01-12 DIAGNOSIS — I10 Essential (primary) hypertension: Secondary | ICD-10-CM | POA: Diagnosis not present

## 2018-01-12 DIAGNOSIS — Z794 Long term (current) use of insulin: Secondary | ICD-10-CM | POA: Diagnosis not present

## 2018-01-12 DIAGNOSIS — R410 Disorientation, unspecified: Secondary | ICD-10-CM | POA: Diagnosis not present

## 2018-01-12 DIAGNOSIS — E161 Other hypoglycemia: Secondary | ICD-10-CM | POA: Diagnosis not present

## 2018-01-12 DIAGNOSIS — R531 Weakness: Secondary | ICD-10-CM | POA: Diagnosis not present

## 2018-01-12 LAB — CBG MONITORING, ED
Glucose-Capillary: 25 mg/dL — CL (ref 65–99)
Glucose-Capillary: 22 mg/dL — CL (ref 65–99)
Glucose-Capillary: 75 mg/dL (ref 65–99)

## 2018-01-12 MED ORDER — GLUCOSE 40 % PO GEL
ORAL | Status: AC
  Filled 2018-01-12: qty 1

## 2018-01-12 MED ORDER — GLUCAGON HCL RDNA (DIAGNOSTIC) 1 MG IJ SOLR
INTRAMUSCULAR | Status: AC
  Filled 2018-01-12: qty 1

## 2018-01-12 MED ORDER — DEXTROSE 50 % IV SOLN
INTRAVENOUS | Status: AC
  Filled 2018-01-12: qty 50

## 2018-01-12 NOTE — ED Notes (Signed)
ED Provider at bedside. 

## 2018-01-12 NOTE — ED Notes (Signed)
IV team at bedside 

## 2018-01-12 NOTE — ED Notes (Signed)
Patient transported to X-ray 

## 2018-01-12 NOTE — ED Provider Notes (Addendum)
[facility] EMERGENCY DEPARTMENT Provider Note   CSN: [id number] Arrival date & time: 01/12/18  2212     History   Chief Complaint No chief complaint on file.   HPI [name] is a 77 y.o. male.  Patient with a history of IDDM, CAD, ESRD-HD (fistula right UE), HTN, HLD, sleep apnea (not on CPAP) presents with hypoglycemia. Per patient and wife, he woke this morning and ate breakfast as usual, then took his Lantus. Later in the morning he started having symptoms of low sugar which he describes as visual changes, pallor, lightheadedness, confusion per wife, and he drank some juice and felt better. Symptoms of intermittent confusion have been continuous through the day. The patient reports he is on Lantus q am and Novolog TID with recent decrease in dosing due to recurrent hypoglycemia. He now states he feels he may have taken at least one extra dose of his insulin medications today - he is unsure how he has medicated today. He also reports a decreased appetite and has not been eating well. No vomiting. No fall today. No fever, cough, nausea, vomiting.  The history is provided by the patient. No language interpreter was used.    Past Medical History:  Diagnosis Date  . Allergy   . Anemia   . Arthritis   . Cataract    bil cateracts removed  . Coronary artery disease   . Diabetes mellitus    Type 2  . Diverticulitis   . ED (erectile dysfunction)   . 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 ESRD (end stage renal disease) on dialysis ([place]) 03/2015  . GERD (gastroesophageal reflux disease)    pepto   . Gout   . Hyperlipidemia   . Hypertension   . Hypothyroidism   . Pneumonia   . PVD (peripheral vascular disease) ([place])    has plastic aorta  . Renal insufficiency   . Seasonal allergies   . Shortness of breath dyspnea   . Sleep apnea    does not wear c-pap  . Thyroid disease     Patient Active Problem List   Diagnosis Date Noted  . Dyspnea   . Macrocytic  anemia 06/04/2016  . Thrombocytopenia ([place]) 06/04/2016  . Exertional dyspnea 06/04/2016  . Arm paresthesia, left 06/04/2016  . Dyspnea on exertion 06/04/2016  . Tenderness of right calf 06/04/2016  . Mild cognitive impairment with memory loss 01/12/2016  . ESRD on dialysis ([place]) 05/17/2015  . Hypoglycemia   . Weakness 02/18/2015  . OSA (obstructive sleep apnea) 02/02/2015  . Hyperkalemia 10/05/2014  . Chronic kidney disease (CKD), stage IV (severe) ([place]) 04/07/2014  . End stage renal disease ([place]) 11/18/2013  . BPH (benign prostatic hyperplasia) 11/22/2012  . Peripheral vascular disease ([place]) 12/24/2011  . Hyperparathyroidism ([place]) 12/24/2011  . Hypothyroidism 12/24/2011  . Allergic rhinitis 12/24/2011  . Erectile dysfunction 12/24/2011  . Insulin dependent diabetes mellitus ([place]) 09/03/2008  . Hyperlipidemia 09/03/2008  . Essential hypertension 08/30/2008  . PANCREATITIS, HX OF 08/30/2008  . RENAL FAILURE, ACUTE, HX OF 08/30/2008  . DIVERTICULOSIS, COLON 06/08/2003    Past Surgical History:  Procedure Laterality Date  . aortobifemoral bypass    . AV FISTULA PLACEMENT Left 12/01/2013   Procedure: ARTERIOVENOUS (AV) FISTULA CREATION- LEFT BRACHIOCEPHALIC;  Surgeon: Angelia Mould, MD;  Location: [place];  Service: Vascular;  Laterality: Left;  . [place] TRANSPOSITION Right 07/27/2014   Procedure: BASCILIC VEIN TRANSPOSITION;  Surgeon: Angelia Mould, MD;  Location: [facility]  OR;  Service: Vascular;  Laterality: Right;  . BREAST SURGERY     left - granulomatous mastitis  . COLONOSCOPY    . 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 EYE SURGERY Bilateral    cataracts  . REVISON OF ARTERIOVENOUS FISTULA Left 02/09/2014   Procedure: REVISON OF LEFT ARTERIOVENOUS FISTULA - RESECTION OF RENDUNDANT VEIN;  Surgeon: Angelia Mould, MD;  Location: [place];  Service: Vascular;  Laterality: Left;  . SBO with lysis adhesions    . SHUNTOGRAM Left 04/19/2014    Procedure: FISTULOGRAM;  Surgeon: Angelia Mould, MD;  Location: [facility] CATH LAB;  Service: Cardiovascular;  Laterality: Left;  . UNILATERAL UPPER EXTREMEITY ANGIOGRAM N/A 07/12/2014   Procedure: UNILATERAL UPPER Anselmo Rod;  Surgeon: Angelia Mould, MD;  Location: [facility] CATH LAB;  Service: Cardiovascular;  Laterality: N/A;        Home Medications    Prior to Admission medications   Medication Sig Start Date End Date Taking? Authorizing Provider  acetaminophen (TYLENOL) 500 MG tablet Take 1,000 mg by mouth 2 (two) times daily as needed (for pain).     [provider]  acetaminophen-codeine (TYLENOL #3) 300-30 MG tablet take 1 tablet by mouth every 4 hours if needed for pain 08/15/16   [provider]  allopurinol (ZYLOPRIM) 100 MG tablet Take 100 mg by mouth daily.      [provider]  Ascorbic Acid (VITAMIN C PO) Take 1 tablet by mouth daily.    [provider]  aspirin 325 MG tablet Take 650 mg by mouth daily.     [provider]  B Complex-C-Zn-Folic Acid (DIALYVITE/ZINC) TABS Take 1 tablet by mouth daily. 10/17/17   [provider]  Blood Glucose Monitoring Suppl (FREESTYLE FREEDOM LITE) w/Device KIT Use to check blood sugar 2 times per day dx code E11.65 03/08/16   Elayne Snare, MD  Cholecalciferol (VITAMIN D PO) Take 1 tablet by mouth daily.    [provider]  ciprofloxacin (CIPRO) 500 MG tablet Take 1 tablet (500 mg total) by mouth daily. On dialysis days take after dialysis 10/30/17   Blanchie Dessert, MD  clobetasol (TEMOVATE) 0.05 % external solution apply to affected area ON THE SCALP UP TO 2 TIMES A DAY AS NEEDED...  (REFER TO PRESCRIPTION NOTES). 04/19/15   [provider]  docusate sodium (COLACE) 100 MG capsule Take 1 capsule (100 mg total) by mouth daily. To avoid constipation from pain medication 01/02/18   Tanna Furry, MD  donepezil (ARICEPT) 10 MG tablet Take 1 tablet (10 mg total) by mouth  at bedtime. 01/12/16   Melvenia Beam, MD  Fluocinolone Acetonide Body 0.01 % OIL Apply 1 ml on the skin daily 12/26/17   [provider]  FLUOCINOLONE ACETONIDE SCALP 0.01 % OIL apply to affected area ON SCALP EVERY NIGHT AT BEDTIME, [place] UPON AWAKENING 04/28/15   [provider]  fluticasone (CUTIVATE) 0.05 % cream apply to affected area ON FACE UP TO 2 TIMES A DAY AS NEEDED 04/19/15   [provider]  fluticasone (FLONASE) 50 MCG/ACT nasal spray Place 1 spray into both nostrils daily. Patient taking differently: Place 1 spray into both nostrils daily as needed for allergies.  10/08/14   Mikhail, Velta Addison, DO  glucose blood (ONETOUCH VERIO) test strip Use to check blood sugar 2 times per day. Dx code E11.65 03/01/16   Elayne Snare, MD  Hyprom-Naphaz-Polysorb-Zn Sulf (CLEAR EYES COMPLETE OP) Place 1  drop into both eyes 2 (two) times daily as needed (itchy eyes).    [provider]  insulin aspart (NOVOLOG) 100 UNIT/ML injection Inject 10 Units into the skin 3 (three) times daily before meals. Patient taking differently: Inject 10 Units into the skin See admin instructions. Two to three times a day before meals 01/20/16   Elayne Snare, MD  insulin glargine (LANTUS) 100 UNIT/ML injection Inject 0.08 mLs (8 Units total) into the skin every morning. Patient taking differently: Inject 13 Units into the skin every morning.  06/06/16   Ghimire, Henreitta Leber, MD  Insulin Pen Needle (NOVOFINE) 30G X 8 MM MISC Use to inject insulin 3 times daily 04/03/17   Elayne Snare, MD  ketoconazole (NIZORAL) 2 % shampoo Apply 1 application topically 2 (two) times a week. 03/31/15   Elby Showers, MD  Lancets (FREESTYLE) lancets Use as instructed to check blood sugar 2 times per day dx code E11.65 02/22/16   Elayne Snare, MD  levothyroxine (SYNTHROID, LEVOTHROID) 50 MCG tablet Take 1 tablet (50 mcg total) by mouth daily. 08/06/14   Elby Showers, MD  lidocaine-prilocaine (EMLA) cream Apply 1 application  topically once. AS DIRECTED 03/29/15   [provider]  loratadine (CLARITIN) 10 MG tablet Take 10 mg by mouth daily as needed for allergies.     [provider]  methocarbamol (ROBAXIN) 500 MG tablet Take 1 tablet (500 mg total) by mouth at bedtime as needed for muscle spasms. 01/02/18   Tanna Furry, MD  metroNIDAZOLE (METROGEL) 0.75 % gel APPLY TO AFFECTED AREA ON SKIN  ON FACE TWICE DAILY 06/14/15   [provider]  Multiple Vitamins-Minerals (ZINC PO) Take 1 tablet by mouth daily.    [provider]  mupirocin ointment (BACTROBAN) 2 % Use as directed to affected areas 09/04/16   Elby Showers, MD  Naftifine HCl (NAFTIN) 2 % CREA Apply to feet daily for fungal infection 09/12/15   Elby Showers, MD  nystatin-triamcinolone (MYCOLOG II) cream Apply 1 application topically 2 (two) times daily. 09/26/17   Elby Showers, MD  omeprazole (PRILOSEC) 40 MG capsule Take 1 capsule (40 mg total) by mouth daily. 10/17/17   [place], Amy S, PA-C  oxyCODONE (ROXICODONE) 5 MG immediate release tablet Take 1 tablet (5 mg total) by mouth every 4 (four) hours as needed for severe pain. 01/02/18   Tanna Furry, MD  oxyCODONE-acetaminophen (PERCOCET/ROXICET) 5-325 MG tablet Take 1-2 tablets by mouth every 6 (six) hours as needed for severe pain. 10/30/17   Blanchie Dessert, MD  pioglitazone (ACTOS) 30 MG tablet take 1 tablet by mouth once daily Patient taking differently: take 1 tablet (30 mg)  by mouth once daily 05/30/17   Elayne Snare, MD  Probiotic Product (PROBIOTIC PO) Take 1 tablet by mouth daily.    [provider]  promethazine (PHENERGAN) 12.5 MG tablet Take 1 tablet (12.5 mg total) by mouth every 12 (twelve) hours as needed for nausea or vomiting. 08/14/17   Elby Showers, MD  RENVELA 800 MG tablet Take 800-2,400 mg by mouth See admin instructions. 1,600-2,400 mg with breakfast then 1,600 mg with lunch then 2,400 mg with dinner (evening meal) and 800 mg with each snack  07/27/14   [provider]  SENSIPAR 30 MG tablet Take 30 mg by mouth daily.  12/01/15   [provider]  simvastatin (ZOCOR) 20 MG tablet Take 20 mg by mouth at bedtime.     [provider]  Teriparatide, Recombinant, (FORTEO) 600 MCG/2.4ML SOLN Inject 0.08 mLs (20 mcg total) into the skin daily. 01/09/18   Elayne Snare, MD  traMADol (ULTRAM) 50 MG tablet Take 1 tablet (50 mg total) by mouth every 12 (twelve) hours as needed. 11/07/17   Elby Showers, MD  triamcinolone ointment (KENALOG) 0.1 % Apply 1 application topically 2 (two) times daily. Use between thighs    [provider]    Family History Family History  Problem Relation Age of Onset  . Aneurysm Mother   . Heart disease Father   . Stroke Father   . Hypertension Father   . Diabetes Father   . Dementia Neg Hx   . Colon cancer Neg Hx   . Esophageal cancer Neg Hx   . Pancreatic cancer Neg Hx   . Prostate cancer Neg Hx   . Rectal cancer Neg Hx   . Stomach cancer Neg Hx     Social History Social History   Tobacco Use  . Smoking status: Former Smoker    Last attempt to quit: 09/24/1994    Years since quitting: 23.3  . Smokeless tobacco: Never Used  Substance Use Topics  . Alcohol use: No    Alcohol/week: 0.0 oz    Comment: Quit Oct. 1977 ("somewhat heavy")  . Drug use: No     Allergies   Penicillins   Review of Systems Review of Systems  Constitutional: Negative for chills and fever.  HENT: Negative.   Eyes: Positive for visual disturbance.  Respiratory: Negative.  Negative for shortness of breath.   Cardiovascular: Negative.  Negative for chest pain.  Gastrointestinal: Negative.  Negative for abdominal pain, nausea and vomiting.  Musculoskeletal: Positive for back pain (Recent compression fracture injury).  Skin: Positive for pallor.  Neurological: Positive for weakness and light-headedness.  Psychiatric/Behavioral: Positive for confusion.     Physical Exam Updated Vital  Signs There were no vitals taken for this visit.  Physical Exam  Constitutional: He is oriented to person, place, and time. He appears well-developed and well-nourished.  HENT:  Head: Normocephalic.  Neck: Normal range of motion. Neck supple.  Cardiovascular: Normal rate and regular rhythm.  Pulmonary/Chest: Effort normal and breath sounds normal. He has no wheezes. He has no rales.  Abdominal: Soft. Bowel sounds are normal. There is no tenderness. There is no rebound and no guarding.  Musculoskeletal: Normal range of motion.  Neurological: He is alert and oriented to person, place, and time.  CN's 3-12 grossly intact. Speech is clear and focused. No facial asymmetry. No lateralizing weakness. . No deficits of coordination.   Skin: Skin is warm and dry. No rash noted.  Psychiatric: He has a normal mood and affect.     ED Treatments / Results  Labs (all labs ordered are listed, but only abnormal results are displayed) Labs Reviewed  CBG MONITORING, ED - Abnormal; Notable for the following components:      Result Value   Glucose-Capillary 25 (*)    All other components within normal limits  CBG MONITORING, ED - Abnormal; Notable for the following components:   Glucose-Capillary 22 (*)    All other components within normal limits  CBG MONITORING, ED    EKG None  Radiology No results found.  Procedures Procedures (including critical care time)  Medications Ordered in ED Medications  dextrose 50 % solution (has no administration in time range)  glucagon (human recombinant) (GLUCAGEN) 1 MG injection (has no administration in time range)  dextrose (GLUTOSE) 40 %  oral gel (has no administration in time range)     Initial Impression / Assessment and Plan / ED Course  I have reviewed the triage vital signs and the nursing notes.  Pertinent labs & imaging results that were available during my care of the patient were reviewed by me and considered in my medical decision  making (see chart for details).    Patient here via EMS called out for confusion and hypoglycemia starting earlier today and continuing throughout the day CBG on arrival 25, improves to 75 after juice. Patient alert and oriented now. Patient and wife questioning whether he took too much insulin today and feel this is likely. Will observe over time to insure no further hypoglycemia.   He is found to have a urinary tract infection. IV rocephin provided. Will discharge home on Keflex. CBG's continue to be stable without hypoglycemia.  The patient is seen by Dr. Kathrynn Humble and is found appropriate for discharge home.   Final Clinical Impressions(s) / ED Diagnoses   Final diagnoses:  None   1. Hypoglycemia 2. UTI  ED Discharge Orders    None       Dennie Bible 01/13/18 0413    Varney Biles, MD 01/15/18 2322    Charlann Lange, PA-C 02/04/18 2212    Varney Biles, MD 02/04/18 [phone]

## 2018-01-12 NOTE — ED Notes (Signed)
Pt given graham crackers, peanut butter, and apple juice.

## 2018-01-12 NOTE — ED Triage Notes (Addendum)
Per EMS, pt from home. Pt had recent diabetes medications changed on Thursday. Pt thinks he accidentally took a second dose of his novolog tonight. Pt was hypoglycemic with EMS. Pt still alert, able to drink orange juice. Pt given glucagon IM at hospital. Pt A&Ox4, ambulatory. Pt reports 7/10 lower back pain.   Lantus 10 in AM. Novolog 8 x3/day - new doses since Thursday.  Dialysis pt MWF. Rt arm restricted. Old grafts on left arm.

## 2018-01-13 ENCOUNTER — Other Ambulatory Visit: Payer: Self-pay

## 2018-01-13 DIAGNOSIS — E11649 Type 2 diabetes mellitus with hypoglycemia without coma: Secondary | ICD-10-CM | POA: Diagnosis not present

## 2018-01-13 LAB — URINALYSIS, ROUTINE W REFLEX MICROSCOPIC
Bilirubin Urine: NEGATIVE
Glucose, UA: NEGATIVE mg/dL
Ketones, ur: NEGATIVE mg/dL
Nitrite: NEGATIVE
Protein, ur: 100 mg/dL — AB
Specific Gravity, Urine: 1.014 (ref 1.005–1.030)
pH: 8 (ref 5.0–8.0)

## 2018-01-13 LAB — CBC WITH DIFFERENTIAL/PLATELET
Basophils Absolute: 0 10*3/uL (ref 0.0–0.1)
Basophils Relative: 0 %
Eosinophils Absolute: 0.4 10*3/uL (ref 0.0–0.7)
Eosinophils Relative: 3 %
HCT: 25.9 % — ABNORMAL LOW (ref 39.0–52.0)
Hemoglobin: 8.8 g/dL — ABNORMAL LOW (ref 13.0–17.0)
Lymphocytes Relative: 6 %
Lymphs Abs: 0.9 10*3/uL (ref 0.7–4.0)
MCH: 33.8 pg (ref 26.0–34.0)
MCHC: 34 g/dL (ref 30.0–36.0)
MCV: 99.6 fL (ref 78.0–100.0)
Monocytes Absolute: 0.4 10*3/uL (ref 0.1–1.0)
Monocytes Relative: 3 %
Neutro Abs: 12.9 10*3/uL — ABNORMAL HIGH (ref 1.7–7.7)
Neutrophils Relative %: 88 %
Platelets: 309 10*3/uL (ref 150–400)
RBC: 2.6 MIL/uL — ABNORMAL LOW (ref 4.22–5.81)
RDW: 18.7 % — ABNORMAL HIGH (ref 11.5–15.5)
WBC: 14.6 10*3/uL — ABNORMAL HIGH (ref 4.0–10.5)

## 2018-01-13 LAB — COMPREHENSIVE METABOLIC PANEL
ALT: 20 U/L (ref 17–63)
AST: 44 U/L — ABNORMAL HIGH (ref 15–41)
Albumin: 2.2 g/dL — ABNORMAL LOW (ref 3.5–5.0)
Alkaline Phosphatase: 117 U/L (ref 38–126)
Anion gap: 14 (ref 5–15)
BUN: 28 mg/dL — ABNORMAL HIGH (ref 6–20)
CO2: 28 mmol/L (ref 22–32)
Calcium: 9 mg/dL (ref 8.9–10.3)
Chloride: 94 mmol/L — ABNORMAL LOW (ref 101–111)
Creatinine, Ser: 7.18 mg/dL — ABNORMAL HIGH (ref 0.61–1.24)
GFR calc Af Amer: 8 mL/min — ABNORMAL LOW (ref >60)
GFR calc non Af Amer: 7 mL/min — ABNORMAL LOW (ref >60)
Glucose, Bld: 144 mg/dL — ABNORMAL HIGH (ref 65–99)
Potassium: 3.7 mmol/L (ref 3.5–5.1)
Sodium: 136 mmol/L (ref 135–145)
Total Bilirubin: 0.8 mg/dL (ref 0.3–1.2)
Total Protein: 5.3 g/dL — ABNORMAL LOW (ref 6.5–8.1)

## 2018-01-13 LAB — I-STAT CG4 LACTIC ACID, ED
Lactic Acid, Venous: 3.17 mmol/L (ref 0.5–1.9)
Lactic Acid, Venous: 2.31 mmol/L (ref 0.5–1.9)

## 2018-01-13 LAB — I-STAT VENOUS BLOOD GAS, ED
Acid-Base Excess: 8 mmol/L — ABNORMAL HIGH (ref 0.0–2.0)
Bicarbonate: 32.7 mmol/L — ABNORMAL HIGH (ref 20.0–28.0)
O2 Saturation: 35 %
Patient temperature: 97.3
TCO2: 34 mmol/L — ABNORMAL HIGH (ref 22–32)
pCO2, Ven: 42.8 mmHg — ABNORMAL LOW (ref 44.0–60.0)
pH, Ven: 7.488 — ABNORMAL HIGH (ref 7.250–7.430)
pO2, Ven: 19 mmHg — CL (ref 32.0–45.0)

## 2018-01-13 LAB — TROPONIN I: Troponin I: <0.03 ng/mL (ref <0.03)

## 2018-01-13 LAB — CBG MONITORING, ED
Glucose-Capillary: 131 mg/dL — ABNORMAL HIGH (ref 65–99)
Glucose-Capillary: 142 mg/dL — ABNORMAL HIGH (ref 65–99)
Glucose-Capillary: 153 mg/dL — ABNORMAL HIGH (ref 65–99)

## 2018-01-13 MED ORDER — CEPHALEXIN 500 MG PO CAPS: 500.0000 mg | ORAL_CAPSULE | Freq: Four times a day (QID) | ORAL | 0 refills | Status: DC

## 2018-01-13 MED ORDER — OXYCODONE-ACETAMINOPHEN 5-325 MG PO TABS
1.0000 | ORAL_TABLET | Freq: Once | ORAL | Status: AC
  Administered 2018-01-13: 1 via ORAL
  Filled 2018-01-13: qty 1

## 2018-01-13 MED ORDER — SODIUM CHLORIDE 0.9 % IV SOLN
1.0000 g | Freq: Once | INTRAVENOUS | Status: AC
  Administered 2018-01-13: 1 g via INTRAVENOUS
  Filled 2018-01-13: qty 10

## 2018-01-13 NOTE — ED Notes (Signed)
PA S Upstill informed of lactic acid and venous blood gas results

## 2018-01-13 NOTE — ED Notes (Signed)
Pt verbalizes understanding of d/c instructions. Pt received prescriptions. Pt taken to lobby in wheelchair at d/c with all belongings.   

## 2018-01-13 NOTE — Discharge Instructions (Addendum)
Monitor your blood sugar throughout today. Take your regularly dosed insulin as long as you are eating enough calories. Call your doctor to schedule a recheck appointment for your diabetic control and to have your urine checked to insure the urinary tract infection is resolving. Return to the emergency department with any new concern.

## 2018-01-13 NOTE — ED Notes (Signed)
Dr informed of lactic acid 3.17

## 2018-01-14 ENCOUNTER — Telehealth: Payer: Self-pay | Admitting: *Deleted

## 2018-01-14 LAB — URINE CULTURE: Culture: <10000 — AB

## 2018-01-14 NOTE — Telephone Encounter (Addendum)
[place] PA initiated via CoverMyMeds.  Pt ID: [id number]

## 2018-01-15 ENCOUNTER — Telehealth: Payer: Self-pay | Admitting: Internal Medicine

## 2018-01-15 ENCOUNTER — Telehealth: Payer: Self-pay | Admitting: Nurse Practitioner

## 2018-01-15 DIAGNOSIS — N186 End stage renal disease: Secondary | ICD-10-CM | POA: Diagnosis not present

## 2018-01-15 DIAGNOSIS — E1129 Type 2 diabetes mellitus with other diabetic kidney complication: Secondary | ICD-10-CM | POA: Diagnosis not present

## 2018-01-15 DIAGNOSIS — D631 Anemia in chronic kidney disease: Secondary | ICD-10-CM | POA: Diagnosis not present

## 2018-01-15 DIAGNOSIS — D509 Iron deficiency anemia, unspecified: Secondary | ICD-10-CM | POA: Diagnosis not present

## 2018-01-15 DIAGNOSIS — N2581 Secondary hyperparathyroidism of renal origin: Secondary | ICD-10-CM | POA: Diagnosis not present

## 2018-01-15 NOTE — Telephone Encounter (Signed)
After speaking with Dr Renold Genta, I called [name] back to have her make an appointment with Dr Dwyane Dee to follow up about blood sugars and to also follow up with nephrologist and to let them know what is going on. I scheduled an appointment with Dr Renold Genta for 01/23/18 and also placed a home health referral with [facility].

## 2018-01-15 NOTE — Telephone Encounter (Signed)
Noted.  This will be like 6 mo RV.

## 2018-01-15 NOTE — Telephone Encounter (Signed)
[name] Wife [phone]  [name] called to say she was concerned about [name], having to go to hospital several times in the last few weeks. She said he was still in a lot of pain with the 2 broken vetibras from his fall a couple of weeks ago, she also stated that he went to [place] on Easter with low blood sugars. She said on Tuesday he was suppose to go to Dialysis and he got confused and could not find his keys, so he called 911 to help him, they came and found his keys, she was not home at this time, she thought he was alright when she left. She also stated that [name] needs a cane, wheelchair and Home Health services.

## 2018-01-15 NOTE — Telephone Encounter (Signed)
FYI-Scheduled patient with [name] on 01-20-18 @ 12:45pm check in at 12:15pm regarding change in memory.

## 2018-01-16 DIAGNOSIS — E162 Hypoglycemia, unspecified: Secondary | ICD-10-CM | POA: Diagnosis not present

## 2018-01-16 DIAGNOSIS — E161 Other hypoglycemia: Secondary | ICD-10-CM | POA: Diagnosis not present

## 2018-01-16 NOTE — Telephone Encounter (Signed)
Completed/signed PA form faxed to [facility].

## 2018-01-17 ENCOUNTER — Encounter (HOSPITAL_COMMUNITY): Payer: Self-pay | Admitting: Emergency Medicine

## 2018-01-17 ENCOUNTER — Telehealth: Payer: Self-pay | Admitting: Endocrinology

## 2018-01-17 ENCOUNTER — Observation Stay (HOSPITAL_COMMUNITY)
Admission: EM | Admit: 2018-01-17 | Discharge: 2018-01-18 | Disposition: A | Discharge status: Home / Self Care | Payer: Medicare Other | Attending: Internal Medicine | Admitting: Internal Medicine

## 2018-01-17 DIAGNOSIS — E162 Hypoglycemia, unspecified: Secondary | ICD-10-CM | POA: Diagnosis present

## 2018-01-17 DIAGNOSIS — Z7951 Long term (current) use of inhaled steroids: Secondary | ICD-10-CM | POA: Diagnosis not present

## 2018-01-17 DIAGNOSIS — Z7982 Long term (current) use of aspirin: Secondary | ICD-10-CM | POA: Diagnosis not present

## 2018-01-17 DIAGNOSIS — G8929 Other chronic pain: Secondary | ICD-10-CM | POA: Diagnosis not present

## 2018-01-17 DIAGNOSIS — Z79890 Hormone replacement therapy (postmenopausal): Secondary | ICD-10-CM | POA: Insufficient documentation

## 2018-01-17 DIAGNOSIS — F0390 Unspecified dementia without behavioral disturbance: Secondary | ICD-10-CM | POA: Insufficient documentation

## 2018-01-17 DIAGNOSIS — E1151 Type 2 diabetes mellitus with diabetic peripheral angiopathy without gangrene: Secondary | ICD-10-CM | POA: Insufficient documentation

## 2018-01-17 DIAGNOSIS — R630 Anorexia: Secondary | ICD-10-CM | POA: Diagnosis not present

## 2018-01-17 DIAGNOSIS — I120 Hypertensive chronic kidney disease with stage 5 chronic kidney disease or end stage renal disease: Secondary | ICD-10-CM | POA: Diagnosis not present

## 2018-01-17 DIAGNOSIS — E119 Type 2 diabetes mellitus without complications: Secondary | ICD-10-CM | POA: Diagnosis not present

## 2018-01-17 DIAGNOSIS — G4733 Obstructive sleep apnea (adult) (pediatric): Secondary | ICD-10-CM | POA: Insufficient documentation

## 2018-01-17 DIAGNOSIS — M810 Age-related osteoporosis without current pathological fracture: Secondary | ICD-10-CM | POA: Diagnosis not present

## 2018-01-17 DIAGNOSIS — N186 End stage renal disease: Secondary | ICD-10-CM | POA: Diagnosis not present

## 2018-01-17 DIAGNOSIS — E1122 Type 2 diabetes mellitus with diabetic chronic kidney disease: Secondary | ICD-10-CM | POA: Insufficient documentation

## 2018-01-17 DIAGNOSIS — E11649 Type 2 diabetes mellitus with hypoglycemia without coma: Principal | ICD-10-CM | POA: Insufficient documentation

## 2018-01-17 DIAGNOSIS — IMO0001 Reserved for inherently not codable concepts without codable children: Secondary | ICD-10-CM

## 2018-01-17 DIAGNOSIS — Z794 Long term (current) use of insulin: Secondary | ICD-10-CM | POA: Diagnosis not present

## 2018-01-17 DIAGNOSIS — I2510 Atherosclerotic heart disease of native coronary artery without angina pectoris: Secondary | ICD-10-CM | POA: Diagnosis not present

## 2018-01-17 DIAGNOSIS — E785 Hyperlipidemia, unspecified: Secondary | ICD-10-CM | POA: Diagnosis not present

## 2018-01-17 DIAGNOSIS — M109 Gout, unspecified: Secondary | ICD-10-CM | POA: Diagnosis not present

## 2018-01-17 DIAGNOSIS — K219 Gastro-esophageal reflux disease without esophagitis: Secondary | ICD-10-CM | POA: Diagnosis not present

## 2018-01-17 DIAGNOSIS — Z992 Dependence on renal dialysis: Secondary | ICD-10-CM

## 2018-01-17 DIAGNOSIS — I10 Essential (primary) hypertension: Secondary | ICD-10-CM | POA: Diagnosis not present

## 2018-01-17 DIAGNOSIS — Z87891 Personal history of nicotine dependence: Secondary | ICD-10-CM | POA: Diagnosis not present

## 2018-01-17 DIAGNOSIS — E7849 Other hyperlipidemia: Secondary | ICD-10-CM | POA: Diagnosis not present

## 2018-01-17 DIAGNOSIS — E039 Hypothyroidism, unspecified: Secondary | ICD-10-CM | POA: Diagnosis not present

## 2018-01-17 DIAGNOSIS — Z79899 Other long term (current) drug therapy: Secondary | ICD-10-CM | POA: Diagnosis not present

## 2018-01-17 DIAGNOSIS — M545 Low back pain, unspecified: Secondary | ICD-10-CM

## 2018-01-17 HISTORY — PX: HEMODIALYSIS INPATIENT: DIA12

## 2018-01-17 LAB — CBC WITH DIFFERENTIAL/PLATELET
Basophils Absolute: 0 10*3/uL (ref 0.0–0.1)
Basophils Relative: 0 %
Eosinophils Absolute: 0.3 10*3/uL (ref 0.0–0.7)
Eosinophils Relative: 3 %
HCT: 27.9 % — ABNORMAL LOW (ref 39.0–52.0)
Hemoglobin: 9.2 g/dL — ABNORMAL LOW (ref 13.0–17.0)
Lymphocytes Relative: 15 %
Lymphs Abs: 1.7 10*3/uL (ref 0.7–4.0)
MCH: 33.8 pg (ref 26.0–34.0)
MCHC: 33 g/dL (ref 30.0–36.0)
MCV: 102.6 fL — ABNORMAL HIGH (ref 78.0–100.0)
Monocytes Absolute: 1.1 10*3/uL — ABNORMAL HIGH (ref 0.1–1.0)
Monocytes Relative: 10 %
Neutro Abs: 7.9 10*3/uL — ABNORMAL HIGH (ref 1.7–7.7)
Neutrophils Relative %: 72 %
Platelets: 327 10*3/uL (ref 150–400)
RBC: 2.72 MIL/uL — ABNORMAL LOW (ref 4.22–5.81)
RDW: 19.2 % — ABNORMAL HIGH (ref 11.5–15.5)
WBC: 11 10*3/uL — ABNORMAL HIGH (ref 4.0–10.5)

## 2018-01-17 LAB — CBG MONITORING, ED
Glucose-Capillary: 103 mg/dL — ABNORMAL HIGH (ref 65–99)
Glucose-Capillary: 113 mg/dL — ABNORMAL HIGH (ref 65–99)
Glucose-Capillary: 116 mg/dL — ABNORMAL HIGH (ref 65–99)
Glucose-Capillary: 141 mg/dL — ABNORMAL HIGH (ref 65–99)
Glucose-Capillary: 57 mg/dL — ABNORMAL LOW (ref 65–99)
Glucose-Capillary: 69 mg/dL (ref 65–99)
Glucose-Capillary: 71 mg/dL (ref 65–99)
Glucose-Capillary: 74 mg/dL (ref 65–99)
Glucose-Capillary: 88 mg/dL (ref 65–99)
Glucose-Capillary: 89 mg/dL (ref 65–99)
Glucose-Capillary: 90 mg/dL (ref 65–99)
Glucose-Capillary: 98 mg/dL (ref 65–99)

## 2018-01-17 LAB — COMPREHENSIVE METABOLIC PANEL
ALT: 18 U/L (ref 17–63)
AST: 34 U/L (ref 15–41)
Albumin: 2.4 g/dL — ABNORMAL LOW (ref 3.5–5.0)
Alkaline Phosphatase: 127 U/L — ABNORMAL HIGH (ref 38–126)
Anion gap: 16 — ABNORMAL HIGH (ref 5–15)
BUN: 23 mg/dL — ABNORMAL HIGH (ref 6–20)
CO2: 28 mmol/L (ref 22–32)
Calcium: 9.6 mg/dL (ref 8.9–10.3)
Chloride: 94 mmol/L — ABNORMAL LOW (ref 101–111)
Creatinine, Ser: 7.05 mg/dL — ABNORMAL HIGH (ref 0.61–1.24)
GFR calc Af Amer: 8 mL/min — ABNORMAL LOW (ref >60)
GFR calc non Af Amer: 7 mL/min — ABNORMAL LOW (ref >60)
Glucose, Bld: 104 mg/dL — ABNORMAL HIGH (ref 65–99)
Potassium: 3.6 mmol/L (ref 3.5–5.1)
Sodium: 138 mmol/L (ref 135–145)
Total Bilirubin: 0.9 mg/dL (ref 0.3–1.2)
Total Protein: 5.7 g/dL — ABNORMAL LOW (ref 6.5–8.1)

## 2018-01-17 LAB — GLUCOSE, CAPILLARY
Glucose-Capillary: 89 mg/dL (ref 65–99)
Glucose-Capillary: 104 mg/dL — ABNORMAL HIGH (ref 65–99)

## 2018-01-17 LAB — MRSA PCR SCREENING: MRSA by PCR: NEGATIVE

## 2018-01-17 MED ORDER — CEPHALEXIN 500 MG PO CAPS
500.0000 mg | ORAL_CAPSULE | Freq: Two times a day (BID) | ORAL | Status: DC
  Administered 2018-01-18 (×2): 500 mg via ORAL
  Filled 2018-01-17 (×3): qty 1

## 2018-01-17 MED ORDER — LIDOCAINE-PRILOCAINE 2.5-2.5 % EX CREA
1.0000 "application " | TOPICAL_CREAM | CUTANEOUS | Status: DC | PRN
  Filled 2018-01-17: qty 5

## 2018-01-17 MED ORDER — OXYCODONE-ACETAMINOPHEN 5-325 MG PO TABS
1.0000 | ORAL_TABLET | Freq: Once | ORAL | Status: AC
  Administered 2018-01-17: 1 via ORAL
  Filled 2018-01-17: qty 1

## 2018-01-17 MED ORDER — ONDANSETRON HCL 4 MG/2ML IJ SOLN: 4.0000 mg | Freq: Four times a day (QID) | INTRAMUSCULAR | Status: DC | PRN

## 2018-01-17 MED ORDER — ONDANSETRON HCL 4 MG PO TABS: 4.0000 mg | ORAL_TABLET | Freq: Four times a day (QID) | ORAL | Status: DC | PRN

## 2018-01-17 MED ORDER — POLYETHYLENE GLYCOL 3350 17 G PO PACK: 17.0000 g | PACK | Freq: Every day | ORAL | Status: DC | PRN

## 2018-01-17 MED ORDER — TERIPARATIDE (RECOMBINANT) 600 MCG/2.4ML ~~LOC~~ SOLN: 20.0000 ug | Freq: Every day | SUBCUTANEOUS | Status: DC

## 2018-01-17 MED ORDER — SIMVASTATIN 20 MG PO TABS
20.0000 mg | ORAL_TABLET | Freq: Every day | ORAL | Status: DC
  Administered 2018-01-18: 20 mg via ORAL
  Filled 2018-01-17 (×2): qty 1

## 2018-01-17 MED ORDER — SODIUM CHLORIDE 0.9% FLUSH
3.0000 mL | Freq: Two times a day (BID) | INTRAVENOUS | Status: DC
  Administered 2018-01-17: 3 mL via INTRAVENOUS

## 2018-01-17 MED ORDER — CEPHALEXIN 250 MG PO CAPS: 500.0000 mg | ORAL_CAPSULE | Freq: Four times a day (QID) | ORAL | Status: DC

## 2018-01-17 MED ORDER — HEPARIN SODIUM (PORCINE) 1000 UNIT/ML DIALYSIS
1000.0000 [IU] | INTRAMUSCULAR | Status: DC | PRN
  Filled 2018-01-17: qty 1

## 2018-01-17 MED ORDER — BISACODYL 5 MG PO TBEC: 5.0000 mg | DELAYED_RELEASE_TABLET | Freq: Every day | ORAL | Status: DC | PRN

## 2018-01-17 MED ORDER — HEPARIN SODIUM (PORCINE) 1000 UNIT/ML DIALYSIS
5000.0000 [IU] | Freq: Once | INTRAMUSCULAR | Status: DC
  Filled 2018-01-17: qty 5

## 2018-01-17 MED ORDER — ACETAMINOPHEN-CODEINE #3 300-30 MG PO TABS
1.0000 | ORAL_TABLET | Freq: Four times a day (QID) | ORAL | Status: DC | PRN
  Administered 2018-01-18: 1 via ORAL
  Filled 2018-01-17: qty 1

## 2018-01-17 MED ORDER — PENTAFLUOROPROP-TETRAFLUOROETH EX AERO: 1.0000 "application " | INHALATION_SPRAY | CUTANEOUS | Status: DC | PRN

## 2018-01-17 MED ORDER — METRONIDAZOLE 0.75 % EX GEL
Freq: Two times a day (BID) | CUTANEOUS | Status: DC
  Administered 2018-01-18: 08:00:00 via TOPICAL
  Filled 2018-01-17: qty 45

## 2018-01-17 MED ORDER — DOCUSATE SODIUM 100 MG PO CAPS
100.0000 mg | ORAL_CAPSULE | Freq: Every day | ORAL | Status: DC
  Administered 2018-01-18: 100 mg via ORAL
  Filled 2018-01-17 (×2): qty 1

## 2018-01-17 MED ORDER — ASPIRIN 325 MG PO TABS
325.0000 mg | ORAL_TABLET | Freq: Every day | ORAL | Status: DC
  Administered 2018-01-18: 325 mg via ORAL
  Filled 2018-01-17 (×2): qty 1

## 2018-01-17 MED ORDER — SEVELAMER CARBONATE 800 MG PO TABS
800.0000 mg | ORAL_TABLET | Freq: Three times a day (TID) | ORAL | Status: DC
  Administered 2018-01-18 (×2): 800 mg via ORAL
  Filled 2018-01-17 (×2): qty 1

## 2018-01-17 MED ORDER — LIDOCAINE HCL (PF) 1 % IJ SOLN
5.0000 mL | INTRAMUSCULAR | Status: DC | PRN
Start: 2018-01-17 — End: 2018-01-18
  Filled 2018-01-17: qty 5

## 2018-01-17 MED ORDER — CINACALCET HCL 30 MG PO TABS
30.0000 mg | ORAL_TABLET | Freq: Every day | ORAL | Status: DC
  Administered 2018-01-18 (×2): 30 mg via ORAL
  Filled 2018-01-17 (×2): qty 1

## 2018-01-17 MED ORDER — SODIUM CHLORIDE 0.9% FLUSH: 3.0000 mL | INTRAVENOUS | Status: DC | PRN

## 2018-01-17 MED ORDER — HEPARIN SODIUM (PORCINE) 5000 UNIT/ML IJ SOLN
5000.0000 [IU] | Freq: Three times a day (TID) | INTRAMUSCULAR | Status: DC
  Administered 2018-01-18: 5000 [IU] via SUBCUTANEOUS
  Filled 2018-01-17 (×2): qty 1

## 2018-01-17 MED ORDER — SODIUM CHLORIDE 0.9 % IV SOLN: 250.0000 mL | INTRAVENOUS | Status: DC | PRN

## 2018-01-17 MED ORDER — TRIAMCINOLONE ACETONIDE 0.1 % EX OINT
1.0000 "application " | TOPICAL_OINTMENT | Freq: Two times a day (BID) | CUTANEOUS | Status: DC
  Administered 2018-01-18: 1 via TOPICAL
  Filled 2018-01-17: qty 15

## 2018-01-17 MED ORDER — METHOCARBAMOL 500 MG PO TABS: 500.0000 mg | ORAL_TABLET | Freq: Every evening | ORAL | Status: DC | PRN

## 2018-01-17 MED ORDER — LIDOCAINE HCL (PF) 1 % IJ SOLN
5.0000 mL | INTRAMUSCULAR | Status: DC | PRN
  Filled 2018-01-17: qty 5

## 2018-01-17 MED ORDER — PANTOPRAZOLE SODIUM 40 MG PO TBEC
40.0000 mg | DELAYED_RELEASE_TABLET | Freq: Every day | ORAL | Status: DC
  Administered 2018-01-18: 40 mg via ORAL
  Filled 2018-01-17 (×2): qty 1

## 2018-01-17 MED ORDER — DONEPEZIL HCL 10 MG PO TABS
10.0000 mg | ORAL_TABLET | Freq: Every day | ORAL | Status: DC
  Administered 2018-01-18: 10 mg via ORAL
  Filled 2018-01-17 (×2): qty 1

## 2018-01-17 MED ORDER — SODIUM CHLORIDE 0.9 % IV SOLN
100.0000 mL | INTRAVENOUS | Status: DC | PRN
Start: 2018-01-17 — End: 2018-01-18

## 2018-01-17 MED ORDER — ALLOPURINOL 100 MG PO TABS: 100.0000 mg | ORAL_TABLET | Freq: Every day | ORAL | Status: DC | PRN

## 2018-01-17 MED ORDER — SODIUM CHLORIDE 0.9 % IV SOLN: 100.0000 mL | INTRAVENOUS | Status: DC | PRN

## 2018-01-17 MED ORDER — METRONIDAZOLE 0.75 % EX GEL: Freq: Two times a day (BID) | CUTANEOUS | Status: DC

## 2018-01-17 MED ORDER — LEVOTHYROXINE SODIUM 50 MCG PO TABS
50.0000 ug | ORAL_TABLET | Freq: Every day | ORAL | Status: DC
  Administered 2018-01-18: 50 ug via ORAL
  Filled 2018-01-17 (×2): qty 1

## 2018-01-17 NOTE — Telephone Encounter (Signed)
She needs to make sure he is taking the proper insulin doses.  He told me he was taking 13 Lantus and 10 NovoLog.  If he is only taking 10 Lantus and 8 NovoLog he will need to reduce the Lantus to 8 units and NovoLog 4 units at meals He needs to see me in 2 weeks for follow-up

## 2018-01-17 NOTE — Progress Notes (Deleted)
[facility]  PATIENT: [name] DOB: [birth year]   REASON FOR VISIT: *** HISTORY FROM:    HISTORY OF PRESENT ILLNESS: ***  REVIEW OF SYSTEMS: Full 14 system review of systems performed and notable only for those listed, all others are neg:  Constitutional: neg  Cardiovascular: neg Ear/Nose/Throat: neg  Skin: neg Eyes: neg Respiratory: neg Gastroitestinal: neg  Hematology/Lymphatic: neg  Endocrine: neg Musculoskeletal:neg Allergy/Immunology: neg Neurological: neg Psychiatric: neg Sleep : neg   ALLERGIES: Allergies  Allergen Reactions  . Penicillins Rash    Has patient had a PCN reaction causing immediate rash, facial/tongue/throat swelling, SOB or lightheadedness with hypotension: Yes Has patient had a PCN reaction causing severe rash involving mucus membranes or skin necrosis: Yes Has patient had a PCN reaction that required hospitalization: No Has patient had a PCN reaction occurring within the last 10 years: No If all of the above answers are "NO", then may proceed with Cephalosporin use.     HOME MEDICATIONS: Facility-Administered Medications Prior to Visit  Medication Dose Route Frequency Provider Last Rate Last Dose  . 0.9 %  sodium chloride infusion  500 mL Intravenous Once Nandigam, Kavitha V, MD      . dextrose 5 % solution   Intravenous Once Nandigam, Venia Minks, MD       Outpatient Medications Prior to Visit  Medication Sig Dispense Refill  . acetaminophen (TYLENOL) 500 MG tablet Take 1,000 mg by mouth 2 (two) times daily as needed (for pain).     Marland Kitchen acetaminophen-codeine (TYLENOL #3) 300-30 MG tablet take 1 tablet by mouth every 4 hours if needed for pain  0  . allopurinol (ZYLOPRIM) 100 MG tablet Take 100 mg by mouth daily.      . Ascorbic Acid (VITAMIN C PO) Take 1 tablet by mouth daily.    Marland Kitchen aspirin 325 MG tablet Take 650 mg by mouth daily.     . B Complex-C-Zn-Folic Acid (DIALYVITE/ZINC) TABS Take 1 tablet by mouth daily.   3  . Blood Glucose Monitoring Suppl (FREESTYLE FREEDOM LITE) w/Device KIT Use to check blood sugar 2 times per day dx code E11.65 1 each 0  . cephALEXin (KEFLEX) 500 MG capsule Take 1 capsule (500 mg total) by mouth 4 (four) times daily. 20 capsule 0  . Cholecalciferol (VITAMIN D PO) Take 1 tablet by mouth daily.    . ciprofloxacin (CIPRO) 500 MG tablet Take 1 tablet (500 mg total) by mouth daily. On dialysis days take after dialysis (Patient taking differently: Take 500 mg by mouth every Monday, Wednesday, and Friday. On dialysis days take after dialysis) 10 tablet 0  . clobetasol (TEMOVATE) 0.05 % external solution apply to affected area ON THE SCALP UP TO 2 TIMES A DAY AS NEEDED...  (REFER TO PRESCRIPTION NOTES).  0  . docusate sodium (COLACE) 100 MG capsule Take 1 capsule (100 mg total) by mouth daily. To avoid constipation from pain medication 30 capsule 0  . donepezil (ARICEPT) 10 MG tablet Take 1 tablet (10 mg total) by mouth at bedtime. 30 tablet 12  . Fluocinolone Acetonide Body 0.01 % OIL Apply 1 ml on the skin daily  3  . FLUOCINOLONE ACETONIDE SCALP 0.01 % OIL apply to affected area ON SCALP EVERY NIGHT AT BEDTIME, WASH UPON AWAKENING  0  . fluticasone (CUTIVATE) 0.05 % cream apply to affected area ON FACE UP TO 2 TIMES A DAY AS NEEDED  0  . fluticasone (FLONASE) 50 MCG/ACT nasal spray Place 1  spray into both nostrils daily. (Patient taking differently: Place 1 spray into both nostrils daily as needed for allergies. ) 16 g 2  . glucose blood (ONETOUCH VERIO) test strip Use to check blood sugar 2 times per day. Dx code E11.65 100 each 2  . Hyprom-Naphaz-Polysorb-Zn Sulf (CLEAR EYES COMPLETE OP) Place 1 drop into both eyes 2 (two) times daily as needed (itchy eyes).    . insulin aspart (NOVOLOG) 100 UNIT/ML injection Inject 10 Units into the skin 3 (three) times daily before meals. (Patient taking differently: Inject 10 Units into the skin See admin instructions. Two to three times a day  before meals) 20 mL 2  . insulin glargine (LANTUS) 100 UNIT/ML injection Inject 0.08 mLs (8 Units total) into the skin every morning. (Patient taking differently: Inject 11 Units into the skin daily. )    . Insulin Pen Needle (NOVOFINE) 30G X 8 MM MISC Use to inject insulin 3 times daily 90 each 4  . ketoconazole (NIZORAL) 2 % shampoo Apply 1 application topically 2 (two) times a week. 120 mL 0  . Lancets (FREESTYLE) lancets Use as instructed to check blood sugar 2 times per day dx code E11.65 100 each 3  . levothyroxine (SYNTHROID, LEVOTHROID) 50 MCG tablet Take 1 tablet (50 mcg total) by mouth daily. 90 tablet 1  . lidocaine-prilocaine (EMLA) cream Apply 1 application topically every Monday, Wednesday, and Friday. AS DIRECTED  1  . loratadine (CLARITIN) 10 MG tablet Take 10 mg by mouth daily as needed for allergies.     . methocarbamol (ROBAXIN) 500 MG tablet Take 1 tablet (500 mg total) by mouth at bedtime as needed for muscle spasms. 20 tablet 0  . metroNIDAZOLE (METROGEL) 0.75 % gel APPLY TO AFFECTED AREA ON SKIN  ON FACE TWICE DAILY  0  . Multiple Vitamins-Minerals (ZINC PO) Take 1 tablet by mouth daily.    . mupirocin ointment (BACTROBAN) 2 % Use as directed to affected areas (Patient taking differently: Apply 1 application topically 3 (three) times daily as needed (to affected areas). ) 22 g prn  . Naftifine HCl (NAFTIN) 2 % CREA Apply to feet daily for fungal infection (Patient taking differently: Apply topically daily as needed (for fungal infection). To feet) 60 g prn  . nystatin-triamcinolone (MYCOLOG II) cream Apply 1 application topically 2 (two) times daily. 30 g 0  . omeprazole (PRILOSEC) 40 MG capsule Take 1 capsule (40 mg total) by mouth daily. 30 capsule 3  . oxyCODONE (ROXICODONE) 5 MG immediate release tablet Take 1 tablet (5 mg total) by mouth every 4 (four) hours as needed for severe pain. 16 tablet 0  . oxyCODONE-acetaminophen (PERCOCET/ROXICET) 5-325 MG tablet Take 1-2  tablets by mouth every 6 (six) hours as needed for severe pain. 15 tablet 0  . pioglitazone (ACTOS) 30 MG tablet take 1 tablet by mouth once daily (Patient taking differently: take 1 tablet (30 mg)  by mouth once daily) 30 tablet 2  . Probiotic Product (PROBIOTIC PO) Take 1 tablet by mouth daily.    . promethazine (PHENERGAN) 12.5 MG tablet Take 1 tablet (12.5 mg total) by mouth every 12 (twelve) hours as needed for nausea or vomiting. 20 tablet 0  . RENVELA 800 MG tablet Take 800-1,600 mg by mouth See admin instructions. Take 1 tablet with breakfast and at lunch then take 2 tablets at dinner and 800 mg with each snack  0  . SENSIPAR 30 MG tablet Take 30 mg by mouth daily.  1  . simvastatin (ZOCOR) 20 MG tablet Take 20 mg by mouth at bedtime.     . Teriparatide, Recombinant, (FORTEO) 600 MCG/2.4ML SOLN Inject 0.08 mLs (20 mcg total) into the skin daily. 2.24 mL 1  . traMADol (ULTRAM) 50 MG tablet Take 1 tablet (50 mg total) by mouth every 12 (twelve) hours as needed. 30 tablet 0  . triamcinolone ointment (KENALOG) 0.1 % Apply 1 application topically 2 (two) times daily. Use between thighs      PAST MEDICAL HISTORY: Past Medical History:  Diagnosis Date  . Allergy   . Anemia   . Arthritis   . Cataract    bil cateracts removed  . Coronary artery disease   . Diabetes mellitus    Type 2  . Diverticulitis   . ED (erectile dysfunction)   . 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 ESRD (end stage renal disease) on dialysis ([place]) 03/2015  . GERD (gastroesophageal reflux disease)    pepto   . Gout   . Hyperlipidemia   . Hypertension   . Hypothyroidism   . Pneumonia   . PVD (peripheral vascular disease) ([place])    has plastic aorta  . Renal insufficiency   . Seasonal allergies   . Shortness of breath dyspnea   . Sleep apnea    does not wear c-pap  . Thyroid disease     PAST SURGICAL HISTORY: Past Surgical History:  Procedure Laterality Date  . aortobifemoral bypass    . AV FISTULA  PLACEMENT Left 12/01/2013   Procedure: ARTERIOVENOUS (AV) FISTULA CREATION- LEFT BRACHIOCEPHALIC;  Surgeon: Angelia Mould, MD;  Location: [place];  Service: Vascular;  Laterality: Left;  . [place] TRANSPOSITION Right 07/27/2014   Procedure: BASCILIC VEIN TRANSPOSITION;  Surgeon: Angelia Mould, MD;  Location: [place];  Service: Vascular;  Laterality: Right;  . BREAST SURGERY     left - granulomatous mastitis  . COLONOSCOPY    . 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 EYE SURGERY Bilateral    cataracts  . REVISON OF ARTERIOVENOUS FISTULA Left 02/09/2014   Procedure: REVISON OF LEFT ARTERIOVENOUS FISTULA - RESECTION OF RENDUNDANT VEIN;  Surgeon: Angelia Mould, MD;  Location: [place];  Service: Vascular;  Laterality: Left;  . SBO with lysis adhesions    . SHUNTOGRAM Left 04/19/2014   Procedure: FISTULOGRAM;  Surgeon: Angelia Mould, MD;  Location[facility] CATH LAB;  Service: Cardiovascular;  Laterality: Left;  . UNILATERAL UPPER EXTREMEITY ANGIOGRAM N/A 07/12/2014   Procedure: UNILATERAL UPPER Anselmo Rod;  Surgeon: Angelia Mould, MD;  Location: [facility] CATH LAB;  Service: Cardiovascular;  Laterality: N/A;    FAMILY HISTORY: Family History  Problem Relation Age of Onset  . Aneurysm Mother   . Heart disease Father   . Stroke Father   . Hypertension Father   . Diabetes Father   . Dementia Neg Hx   . Colon cancer Neg Hx   . Esophageal cancer Neg Hx   . Pancreatic cancer Neg Hx   . Prostate cancer Neg Hx   . Rectal cancer Neg Hx   . Stomach cancer Neg Hx     SOCIAL HISTORY: Social History   Socioeconomic History  . Marital status: Married    Spouse name: [name] Mood  . Number of children: 1  . Years of education: 12  . Highest education level: Not on file  Occupational History  . Occupation: Retired  Scientific laboratory technician  . Financial  resource strain: Not on file  . Food insecurity:    Worry: Not on file    Inability: Not on file  .  Transportation needs:    Medical: No    Non-medical: No  Tobacco Use  . Smoking status: Former Smoker    Last attempt to quit: 09/24/1994    Years since quitting: 23.3  . Smokeless tobacco: Never Used  Substance and Sexual Activity  . Alcohol use: No    Alcohol/week: 0.0 oz    Comment: Quit Oct. 1977 ("somewhat heavy")  . Drug use: No  . Sexual activity: Not on file  Lifestyle  . Physical activity:    Days per week: 3 days    Minutes per session: 10 min  . Stress: Not at all  Relationships  . Social connections:    Talks on phone: Not on file    Gets together: Not on file    Attends religious service: Not on file    Active member of club or organization: Not on file    Attends meetings of clubs or organizations: Not on file    Relationship status: Not on file  . Intimate partner violence:    Fear of current or ex partner: Not on file    Emotionally abused: Not on file    Physically abused: Not on file    Forced sexual activity: Not on file  Other Topics Concern  . Not on file  Social History Narrative   Lives at home with wife.   Caffeine use: Drinks no soda or tea.    Drinks 1 cup coffee/week   Right handed     PHYSICAL EXAM  There were no vitals filed for this visit. There is no height or weight on file to calculate BMI.  Generalized: Well developed, in no acute distress  Head: normocephalic and atraumatic,. Oropharynx benign  Neck: Supple, no carotid bruits  Cardiac: Regular rate rhythm, no murmur  Musculoskeletal: No deformity   Neurological examination   Mentation: Alert oriented to time, place, history taking. Attention span and concentration appropriate. Recent and remote memory intact.  Follows all commands speech and language fluent.   Cranial nerve II-XII: Fundoscopic exam reveals sharp disc margins.Pupils were equal round reactive to light extraocular movements were full, visual field were full on confrontational test. Facial sensation and strength  were normal. hearing was intact to finger rubbing bilaterally. Uvula tongue midline. head turning and shoulder shrug were normal and symmetric.Tongue protrusion into cheek strength was normal. Motor: normal bulk and tone, full strength in the BUE, BLE, fine finger movements normal, no pronator drift. No focal weakness Sensory: normal and symmetric to light touch, pinprick, and  Vibration, proprioception  Coordination: finger-nose-finger, heel-to-shin bilaterally, no dysmetria Reflexes: Brachioradialis 2/2, biceps 2/2, triceps 2/2, patellar 2/2, Achilles 2/2, plantar responses were flexor bilaterally. Gait and Station: Rising up from seated position without assistance, normal stance,  moderate stride, good arm swing, smooth turning, able to perform tiptoe, and heel walking without difficulty. Tandem gait is steady  DIAGNOSTIC DATA (LABS, IMAGING, TESTING) - I reviewed patient records, labs, notes, testing and imaging myself where available.  Lab Results  Component Value Date   WBC 11.0 (H) 01/17/2018   HGB 9.2 (L) 01/17/2018   HCT 27.9 (L) 01/17/2018   MCV 102.6 (H) 01/17/2018   PLT 327 01/17/2018      Component Value Date/Time   NA 138 01/17/2018 0104   K 3.6 01/17/2018 0104   CL 94 (L) 01/17/2018 0104  CO2 28 01/17/2018 0104   GLUCOSE 104 (H) 01/17/2018 0104   BUN 23 (H) 01/17/2018 0104   CREATININE 7.05 (H) 01/17/2018 0104   CREATININE 5.26 (H) 11/07/2017 1050   CALCIUM 9.6 01/17/2018 0104   PROT 5.7 (L) 01/17/2018 0104   ALBUMIN 2.4 (L) 01/17/2018 0104   AST 34 01/17/2018 0104   ALT 18 01/17/2018 0104   ALKPHOS 127 (H) 01/17/2018 0104   BILITOT 0.9 01/17/2018 0104   GFRNONAA 7 (L) 01/17/2018 0104   GFRNONAA 10 (L) 11/07/2017 1050   GFRAA 8 (L) 01/17/2018 0104   GFRAA 11 (L) 11/07/2017 1050   Lab Results  Component Value Date   CHOL 79 09/05/2017   HDL 38.00 (L) 09/05/2017   LDLCALC 21 09/05/2017   TRIG 99.0 09/05/2017   CHOLHDL 2 09/05/2017   Lab Results    Component Value Date   HGBA1C 6.2 01/07/2018   Lab Results  Component Value Date   VITAMINB12 963 09/26/2017   Lab Results  Component Value Date   TSH 1.92 07/12/2017    ***  ASSESSMENT AND PLAN  77 y.o. year old male  has a past medical history of Allergy, Anemia, Arthritis, Cataract, Coronary artery disease, Diabetes mellitus, Diverticulitis, ED (erectile dysfunction), Elevated homocysteine ([place]), ESRD (end stage renal disease) on dialysis ([place]) (03/2015), GERD (gastroesophageal reflux disease), Gout, Hyperlipidemia, Hypertension, Hypothyroidism, Pneumonia, PVD (peripheral vascular disease) ([place]), Renal insufficiency, Seasonal allergies, Shortness of breath dyspnea, Sleep apnea, and Thyroid disease. here with ***    Rayburn Ma, [facility], APRN  [facility] [address] [place], [place] [address] [phone]

## 2018-01-17 NOTE — ED Triage Notes (Signed)
Per EMS pt from home, dialysis graft on R arm, pt has UTI one week ago, same symptoms yellow circles around room, CBG 45 initially, D10 drip 22 L forearm, now 74 CBG, no symptoms presently, pt did not complete dialysis today, HR 74, 100% room air

## 2018-01-17 NOTE — Telephone Encounter (Signed)
Pt wife called wanting to clarify pt insulin medication and dosage. Please advise?  Call pt wife @ [phone]. Thank you!

## 2018-01-17 NOTE — Telephone Encounter (Signed)
Pt wife called and notified of MD instruction to take Lantus 8 units in the am as well as 8 units of Novolog in the am, 8 units of Novolog at lunch, and 8 units of Novolog at dinner. Wife verbalized understanding, and also states that she will call back in a couple of days and schedule a 2 week follow up per Dr. Dwyane Dee instructions.

## 2018-01-17 NOTE — ED Provider Notes (Signed)
[facility] EMERGENCY DEPARTMENT Provider Note   CSN: [id number] Arrival date & time: 01/17/18  0002     History   Chief Complaint Chief Complaint  Patient presents with  . Hypoglycemia    HPI [name] is a 77 y.o. male.  HPI 77 year old male past medical history significant for CAD, ESRD on hemodialysis, hypertension, hyperlipidemia that presents to the ED for evaluation of hypoglycemia.  Patient states that he has been to the ED twice this week for same symptoms with recurring hypoglycemia.  Patient states that this morning he awoke from sleep and took a 10 units of his Lantus.  Patient states that later in the day he ate lunch approximately 2:00 and took 8 units of his NovoLog.  Patient states he started having symptoms of low blood sugar which include visual changes, pallor, lightheadedness, confusion.The patient reports he is on Lantus q am and Novolog TID with recent decrease in dosing due to recurrent hypoglycemia. Patient and wife both state that they take blood sugar habitually and that they are not giving too much of the insulin.  Patient states that he was seen on 4/21 for same this at that time.  He was noted to have a UTI started on antibiotics.  Patient has no other complaints at this time including urinary symptoms, change in bowel habits, abdominal pain, chest pain, shortness of breath.  In route by EMS patient was noted to have a CBG of 45.  D10 drip was initiated.  Patient's blood sugar on arrival was 74.  Pt denies any fever, chill, ha, congestion, neck pain, cp, sob, cough, abd pain, n/v/d, urinary symptoms, change in bowel habits, melena, hematochezia, lower extremity paresthesias.  Past Medical History:  Diagnosis Date  . Allergy   . Anemia   . Arthritis   . Cataract    bil cateracts removed  . Coronary artery disease   . Diabetes mellitus    Type 2  . Diverticulitis   . ED (erectile dysfunction)   . 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  ESRD (end stage renal disease) on dialysis ([place]) 03/2015  . GERD (gastroesophageal reflux disease)    pepto   . Gout   . Hyperlipidemia   . Hypertension   . Hypothyroidism   . Pneumonia   . PVD (peripheral vascular disease) ([place])    has plastic aorta  . Renal insufficiency   . Seasonal allergies   . Shortness of breath dyspnea   . Sleep apnea    does not wear c-pap  . Thyroid disease     Patient Active Problem List   Diagnosis Date Noted  . Dyspnea   . Macrocytic anemia 06/04/2016  . Thrombocytopenia ([place]) 06/04/2016  . Exertional dyspnea 06/04/2016  . Arm paresthesia, left 06/04/2016  . Dyspnea on exertion 06/04/2016  . Tenderness of right calf 06/04/2016  . Mild cognitive impairment with memory loss 01/12/2016  . ESRD on dialysis ([place]) 05/17/2015  . Hypoglycemia   . Weakness 02/18/2015  . OSA (obstructive sleep apnea) 02/02/2015  . Hyperkalemia 10/05/2014  . Chronic kidney disease (CKD), stage IV (severe) ([place]) 04/07/2014  . End stage renal disease ([place]) 11/18/2013  . BPH (benign prostatic hyperplasia) 11/22/2012  . Peripheral vascular disease ([place]) 12/24/2011  . Hyperparathyroidism ([place]) 12/24/2011  . Hypothyroidism 12/24/2011  . Allergic rhinitis 12/24/2011  . Erectile dysfunction 12/24/2011  . Insulin dependent diabetes mellitus ([place]) 09/03/2008  . Hyperlipidemia 09/03/2008  . Essential hypertension 08/30/2008  . PANCREATITIS, HX OF  08/30/2008  . RENAL FAILURE, ACUTE, HX OF 08/30/2008  . DIVERTICULOSIS, COLON 06/08/2003    Past Surgical History:  Procedure Laterality Date  . aortobifemoral bypass    . AV FISTULA PLACEMENT Left 12/01/2013   Procedure: ARTERIOVENOUS (AV) FISTULA CREATION- LEFT BRACHIOCEPHALIC;  Surgeon: Angelia Mould, MD;  Location: [facility];  Service: Vascular;  Laterality: Left;  . [place] TRANSPOSITION Right 07/27/2014   Procedure: BASCILIC VEIN TRANSPOSITION;  Surgeon: Angelia Mould, MD;  Location: [facility];  Service:  Vascular;  Laterality: Right;  . BREAST SURGERY     left - granulomatous mastitis  . COLONOSCOPY    . 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 EYE SURGERY Bilateral    cataracts  . REVISON OF ARTERIOVENOUS FISTULA Left 02/09/2014   Procedure: REVISON OF LEFT ARTERIOVENOUS FISTULA - RESECTION OF RENDUNDANT VEIN;  Surgeon: Angelia Mould, MD;  Location: [facility];  Service: Vascular;  Laterality: Left;  . SBO with lysis adhesions    . SHUNTOGRAM Left 04/19/2014   Procedure: FISTULOGRAM;  Surgeon: Angelia Mould, MD;  Location: [facility] CATH LAB;  Service: Cardiovascular;  Laterality: Left;  . UNILATERAL UPPER EXTREMEITY ANGIOGRAM N/A 07/12/2014   Procedure: UNILATERAL UPPER Anselmo Rod;  Surgeon: Angelia Mould, MD;  Location: [facility] CATH LAB;  Service: Cardiovascular;  Laterality: N/A;        Home Medications    Prior to Admission medications   Medication Sig Start Date End Date Taking? Authorizing Provider  acetaminophen (TYLENOL) 500 MG tablet Take 1,000 mg by mouth 2 (two) times daily as needed (for pain).     [provider]  acetaminophen-codeine (TYLENOL #3) 300-30 MG tablet take 1 tablet by mouth every 4 hours if needed for pain 08/15/16   [provider]  allopurinol (ZYLOPRIM) 100 MG tablet Take 100 mg by mouth daily.      [provider]  Ascorbic Acid (VITAMIN C PO) Take 1 tablet by mouth daily.    [provider]  aspirin 325 MG tablet Take 650 mg by mouth daily.     [provider]  B Complex-C-Zn-Folic Acid (DIALYVITE/ZINC) TABS Take 1 tablet by mouth daily. 10/17/17   [provider]  Blood Glucose Monitoring Suppl (FREESTYLE FREEDOM LITE) w/Device KIT Use to check blood sugar 2 times per day dx code E11.65 03/08/16   Elayne Snare, MD  cephALEXin (KEFLEX) 500 MG capsule Take 1 capsule (500 mg total) by mouth 4 (four) times daily. 01/13/18   Charlann Lange, PA-C  Cholecalciferol (VITAMIN D PO) Take  1 tablet by mouth daily.    [provider]  ciprofloxacin (CIPRO) 500 MG tablet Take 1 tablet (500 mg total) by mouth daily. On dialysis days take after dialysis Patient taking differently: Take 500 mg by mouth every Monday, Wednesday, and Friday. On dialysis days take after dialysis 10/30/17   Blanchie Dessert, MD  clobetasol (TEMOVATE) 0.05 % external solution apply to affected area ON THE SCALP UP TO 2 TIMES A DAY AS NEEDED...  (REFER TO PRESCRIPTION NOTES). 04/19/15   [provider]  docusate sodium (COLACE) 100 MG capsule Take 1 capsule (100 mg total) by mouth daily. To avoid constipation from pain medication 01/02/18   Tanna Furry, MD  donepezil (ARICEPT) 10 MG tablet Take 1 tablet (10 mg total) by mouth at bedtime. 01/12/16   Melvenia Beam, MD  Fluocinolone Acetonide Body 0.01 % OIL Apply 1 ml on the skin daily  12/26/17   [provider]  FLUOCINOLONE ACETONIDE SCALP 0.01 % OIL apply to affected area ON SCALP EVERY NIGHT AT BEDTIME, [place] UPON AWAKENING 04/28/15   [provider]  fluticasone (CUTIVATE) 0.05 % cream apply to affected area ON FACE UP TO 2 TIMES A DAY AS NEEDED 04/19/15   [provider]  fluticasone (FLONASE) 50 MCG/ACT nasal spray Place 1 spray into both nostrils daily. Patient taking differently: Place 1 spray into both nostrils daily as needed for allergies.  10/08/14   Mikhail, Velta Addison, DO  glucose blood (ONETOUCH VERIO) test strip Use to check blood sugar 2 times per day. Dx code E11.65 03/01/16   Elayne Snare, MD  Hyprom-Naphaz-Polysorb-Zn Sulf (CLEAR EYES COMPLETE OP) Place 1 drop into both eyes 2 (two) times daily as needed (itchy eyes).    [provider]  insulin aspart (NOVOLOG) 100 UNIT/ML injection Inject 10 Units into the skin 3 (three) times daily before meals. Patient taking differently: Inject 10 Units into the skin See admin instructions. Two to three times a day before meals 01/20/16   Elayne Snare, MD  insulin  glargine (LANTUS) 100 UNIT/ML injection Inject 0.08 mLs (8 Units total) into the skin every morning. Patient taking differently: Inject 11 Units into the skin daily.  06/06/16   Ghimire, Henreitta Leber, MD  Insulin Pen Needle (NOVOFINE) 30G X 8 MM MISC Use to inject insulin 3 times daily 04/03/17   Elayne Snare, MD  ketoconazole (NIZORAL) 2 % shampoo Apply 1 application topically 2 (two) times a week. 03/31/15   Elby Showers, MD  Lancets (FREESTYLE) lancets Use as instructed to check blood sugar 2 times per day dx code E11.65 02/22/16   Elayne Snare, MD  levothyroxine (SYNTHROID, LEVOTHROID) 50 MCG tablet Take 1 tablet (50 mcg total) by mouth daily. 08/06/14   Elby Showers, MD  lidocaine-prilocaine (EMLA) cream Apply 1 application topically every Monday, Wednesday, and Friday. AS DIRECTED 03/29/15   [provider]  loratadine (CLARITIN) 10 MG tablet Take 10 mg by mouth daily as needed for allergies.     [provider]  methocarbamol (ROBAXIN) 500 MG tablet Take 1 tablet (500 mg total) by mouth at bedtime as needed for muscle spasms. 01/02/18   Tanna Furry, MD  metroNIDAZOLE (METROGEL) 0.75 % gel APPLY TO AFFECTED AREA ON SKIN  ON FACE TWICE DAILY 06/14/15   [provider]  Multiple Vitamins-Minerals (ZINC PO) Take 1 tablet by mouth daily.    [provider]  mupirocin ointment (BACTROBAN) 2 % Use as directed to affected areas Patient taking differently: Apply 1 application topically 3 (three) times daily as needed (to affected areas).  09/04/16   Elby Showers, MD  Naftifine HCl (NAFTIN) 2 % CREA Apply to feet daily for fungal infection Patient taking differently: Apply topically daily as needed (for fungal infection). To feet 09/12/15   Elby Showers, MD  nystatin-triamcinolone (MYCOLOG II) cream Apply 1 application topically 2 (two) times daily. 09/26/17   Elby Showers, MD  omeprazole (PRILOSEC) 40 MG capsule Take 1 capsule (40 mg total) by mouth daily. 10/17/17    Esterwood, Amy S, PA-C  oxyCODONE (ROXICODONE) 5 MG immediate release tablet Take 1 tablet (5 mg total) by mouth every 4 (four) hours as needed for severe pain. 01/02/18   Tanna Furry, MD  oxyCODONE-acetaminophen (PERCOCET/ROXICET) 5-325 MG tablet Take 1-2 tablets by mouth every 6 (six) hours as needed for severe pain. 10/30/17   Blanchie Dessert, MD  pioglitazone (ACTOS) 30 MG tablet take 1 tablet by mouth once daily Patient taking differently: take 1 tablet (30 mg)  by mouth once daily 05/30/17   Elayne Snare, MD  Probiotic Product (PROBIOTIC PO) Take 1 tablet by mouth daily.    [provider]  promethazine (PHENERGAN) 12.5 MG tablet Take 1 tablet (12.5 mg total) by mouth every 12 (twelve) hours as needed for nausea or vomiting. 08/14/17   Elby Showers, MD  RENVELA 800 MG tablet Take 800-1,600 mg by mouth See admin instructions. Take 1 tablet with breakfast and at lunch then take 2 tablets at dinner and 800 mg with each snack 07/27/14   [provider]  SENSIPAR 30 MG tablet Take 30 mg by mouth daily.  12/01/15   [provider]  simvastatin (ZOCOR) 20 MG tablet Take 20 mg by mouth at bedtime.     [provider]  Teriparatide, Recombinant, (FORTEO) 600 MCG/2.4ML SOLN Inject 0.08 mLs (20 mcg total) into the skin daily. 01/09/18   Elayne Snare, MD  traMADol (ULTRAM) 50 MG tablet Take 1 tablet (50 mg total) by mouth every 12 (twelve) hours as needed. 11/07/17   Elby Showers, MD  triamcinolone ointment (KENALOG) 0.1 % Apply 1 application topically 2 (two) times daily. Use between thighs    [provider]    Family History Family History  Problem Relation Age of Onset  . Aneurysm Mother   . Heart disease Father   . Stroke Father   . Hypertension Father   . Diabetes Father   . Dementia Neg Hx   . Colon cancer Neg Hx   . Esophageal cancer Neg Hx   . Pancreatic cancer Neg Hx   . Prostate cancer Neg Hx   . Rectal cancer Neg Hx   . Stomach cancer Neg Hx       Social History Social History   Tobacco Use  . Smoking status: Former Smoker    Last attempt to quit: 09/24/1994    Years since quitting: 23.3  . Smokeless tobacco: Never Used  Substance Use Topics  . Alcohol use: No    Alcohol/week: 0.0 oz    Comment: Quit Oct. 1977 ("somewhat heavy")  . Drug use: No     Allergies   Penicillins   Review of Systems Review of Systems  All other systems reviewed and are negative.    Physical Exam Updated Vital Signs BP 111/66   Pulse 77   Temp 98.5 F (36.9 C) (Oral)   Resp 19   Ht 5' 11"  (1.803 m)   Wt 81.2 kg (179 lb)   SpO2 98%   BMI 24.97 kg/m   Physical Exam Constitutional: He is oriented to person, place, and time. He appears well-developed and well-nourished.  HENT:  Head: Normocephalic.  Neck: Normal range of motion. Neck supple.  Cardiovascular: Normal rate and regular rhythm.  Pulmonary/Chest: Effort normal and breath sounds normal. He has no wheezes. He has no rales.  Abdominal: Soft. Bowel sounds are normal. There is no tenderness. There is no rebound and no guarding.  Musculoskeletal: Normal range of motion.  Neurological: He is alert and oriented to person, place, and time.  CN's 3-12 grossly intact. Speech is clear and focused. No facial asymmetry. No lateralizing weakness. . No deficits of coordination.   Skin: Skin is warm and dry. No rash noted.  Psychiatric: He has a normal mood and affect.    ED Treatments / Results  Labs (all labs  ordered are listed, but only abnormal results are displayed) Labs Reviewed  COMPREHENSIVE METABOLIC PANEL - Abnormal; Notable for the following components:      Result Value   Chloride 94 (*)    Glucose, Bld 104 (*)    BUN 23 (*)    Creatinine, Ser 7.05 (*)    Total Protein 5.7 (*)    Albumin 2.4 (*)    Alkaline Phosphatase 127 (*)    GFR calc non Af Amer 7 (*)    GFR calc Af Amer 8 (*)    Anion gap 16 (*)    All other components within normal limits  CBC WITH  DIFFERENTIAL/PLATELET - Abnormal; Notable for the following components:   WBC 11.0 (*)    RBC 2.72 (*)    Hemoglobin 9.2 (*)    HCT 27.9 (*)    MCV 102.6 (*)    RDW 19.2 (*)    Neutro Abs 7.9 (*)    Monocytes Absolute 1.1 (*)    All other components within normal limits  CBG MONITORING, ED - Abnormal; Notable for the following components:   Glucose-Capillary 57 (*)    All other components within normal limits  CBG MONITORING, ED - Abnormal; Notable for the following components:   Glucose-Capillary 141 (*)    All other components within normal limits  CBG MONITORING, ED - Abnormal; Notable for the following components:   Glucose-Capillary 113 (*)    All other components within normal limits  URINALYSIS, ROUTINE W REFLEX MICROSCOPIC  RAPID URINE DRUG SCREEN, HOSP PERFORMED  CBG MONITORING, ED  CBG MONITORING, ED  CBG MONITORING, ED  CBG MONITORING, ED    EKG EKG Interpretation  Date/Time:  Friday January 17 2018 01:57:57 EDT Ventricular Rate:  78 PR Interval:    QRS Duration: 89 QT Interval:  396 QTC Calculation: 452 R Axis:   5 Text Interpretation:  Sinus rhythm Anterior infarct, old No significant change since last tracing Confirmed by Ripley Fraise [phone]) on 01/17/2018 2:05:14 AM   Radiology No results found.  Procedures Procedures (including critical care time)  Medications Ordered in ED Medications  oxyCODONE-acetaminophen (PERCOCET/ROXICET) 5-325 MG per tablet 1 tablet (1 tablet Oral Given 01/17/18 0143)     Initial Impression / Assessment and Plan / ED Course  I have reviewed the triage vital signs and the nursing notes.  Pertinent labs & imaging results that were available during my care of the patient were reviewed by me and considered in my medical decision making (see chart for details).     Patient presents to the ED for ongoing evaluation of hypoglycemia.  Patient was noted to be hypoglycemic with EMS and was given D10 drip while in route.   Patient's blood sugar on arrival to the ED was 74.  Patient mentating well.  Was seen on 4/21 for same symptoms.  Felt that patient's hypoglycemia was secondary to too much insulin use.  Patient states that they have been monitoring his blood sugars at home and have not been giving him too much insulin.  She denies any other complaints at this time except for chronic back pain.  On exam patient overall well-appearing and nontoxic.  Vital signs reassuring.  Patient afebrile, no tachycardia or hypotension noted.  Lab work reassuring.  Mild leukocytosis of 11,000 which is improved from 4 days ago.  Hemoglobin appears the patient's baseline.  Electro lites are overall reassuring.  Creatinine at baseline given the patient is a hemodialysis patient.  Liver enzymes normal.  Given patient's increased frequency of hypoglycemia despite med compliance for the patient would benefit from further admission and possible adjustment of medications.  I spoke with Dr. Tamala Julian with hospital medicine who agrees to admission and see patient in the ED and place admission orders.  Patient remains hemodynamic stable at this time.  Blood sugars have remained stable in the low 100s.  Patient updated on plan of care.  He was also seen by my attending who is agreeable with the above plan.  Final Clinical Impressions(s) / ED Diagnoses   Final diagnoses:  Hypoglycemia    ED Discharge Orders    None       Aaron Edelman 01/17/18 3015    Ripley Fraise, MD 01/18/18 [phone]

## 2018-01-17 NOTE — Plan of Care (Signed)
Discussed plan of care with Ocie Cornfield, PA-C. [name] is a 77 year old male with past medical history of ESRD on HD, HTN, HLD, IDDM; who presents after having low blood sugar 56 at home.  Given that amp of D50 in route with EMS.  Patient was just seen in the emergency department 5 days ago for the same.  Called to admit for observation and adjustment of medications.  No bed orders placed.

## 2018-01-17 NOTE — H&P (Signed)
History and Physical:    [name]   QMV:[id number] DOB: 1941/01/28 DOA: 01/17/2018  Referring MD/provider: Dr Christy Gentles PCP: Elby Showers, MD   Patient coming from: Home  Chief Complaint: hypoglycemia  History of Present Illness:   [name] is an 77 y.o. male with past medical history significant for mild cognitive impairment, insulin dependent diabetes mellitus, end-stage renal disease on hemodialysis and hypertension who is admitted for hypoglycemia. atient has been to the ED 3 times in the past couple of weeks complaining of hypoglycemia and it was thought that this needed inpatient workup.  Patient himself is unable to give me too good a history. Patient states that he is sometimes aware when his hypoglycemic and sometimes not. He thinks that when he is hypoglycemic he sees yellow when he looks at the light but he's not sure. Patient does note that he has been feeling weaker lately. Patient denies any unexplained weight loss. No easy bruisability. Patient notes he was seen by his PCP and told him of his symptoms and his insulin was decreased. Basal insulin was decreased from 13-10 and NovoLog was decreased from 10-8 which she takes 3 times a day. He continues to be hypoglycemic however.  Of note patient does admit that he has had decreased by mouth intake over the past 3 weeks.He states that he is just lost his appetite. Also of note patient has had 2 falls in the past couple of months and is recently been placed on oxycodone and hydrocodone.Patient notes he takes 1-2 oxycodone's a day when he is able to do so due to pain.  No fevers or chills. No nausea or vomiting. No diarrhea.   ED Course:  The patient was noted to have normal blood sugar in the ED but it was noted that his sugar was 45 by EMS and he was started on a D10 drip. He was asymptomatic. Because this was his third visit for this, he has been admitted.  ROS:   ROS   Review of Systems: General: No fever,  chills, weight changes Skin: No rashes, lesions, wounds Eyes: no discharge, redness, pain Endocrine: no heat/cold intolerance, no polyuria Respiratory: no cough,, shortness of breath, hemoptysis Cardiovascular: No palpitations, chest pain GI: No nausea, vomiting, diarrhea, constipation GU: No dysuria, increased frequency CNS: No numbness, dizziness, headache  Past Medical History:   Past Medical History:  Diagnosis Date  . Allergy   . Anemia   . Arthritis   . Cataract    bil cateracts removed  . Coronary artery disease   . Diabetes mellitus    Type 2  . Diverticulitis   . ED (erectile dysfunction)   . Elevated homocysteine ([place])   . ESRD (end stage renal disease) on dialysis ([place]) 03/2015  . GERD (gastroesophageal reflux disease)    pepto   . Gout   . Hyperlipidemia   . Hypertension   . Hypothyroidism   . Pneumonia   . PVD (peripheral vascular disease) ([place])    has plastic aorta  . Renal insufficiency   . Seasonal allergies   . Shortness of breath dyspnea   . Sleep apnea    does not wear c-pap  . Thyroid disease     Past Surgical History:   Past Surgical History:  Procedure Laterality Date  . aortobifemoral bypass    . AV FISTULA PLACEMENT Left 12/01/2013   Procedure: ARTERIOVENOUS (AV) FISTULA CREATION- LEFT BRACHIOCEPHALIC;  Surgeon: Angelia Mould, MD;  Location: [facility]  OR;  Service: Vascular;  Laterality: Left;  . [place] TRANSPOSITION Right 07/27/2014   Procedure: BASCILIC VEIN TRANSPOSITION;  Surgeon: Angelia Mould, MD;  Location: [place];  Service: Vascular;  Laterality: Right;  . BREAST SURGERY     left - granulomatous mastitis  . COLONOSCOPY    . 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 EYE SURGERY Bilateral    cataracts  . REVISON OF ARTERIOVENOUS FISTULA Left 02/09/2014   Procedure: REVISON OF LEFT ARTERIOVENOUS FISTULA - RESECTION OF RENDUNDANT VEIN;  Surgeon: Angelia Mould, MD;  Location: [place];  Service: Vascular;   Laterality: Left;  . SBO with lysis adhesions    . SHUNTOGRAM Left 04/19/2014   Procedure: FISTULOGRAM;  Surgeon: Angelia Mould, MD;  Location[facility] CATH LAB;  Service: Cardiovascular;  Laterality: Left;  . UNILATERAL UPPER EXTREMEITY ANGIOGRAM N/A 07/12/2014   Procedure: UNILATERAL UPPER Anselmo Rod;  Surgeon: Angelia Mould, MD;  Location: [facility] CATH LAB;  Service: Cardiovascular;  Laterality: N/A;    Social History:   Social History   Socioeconomic History  . Marital status: Married    Spouse name: [name] Mood  . Number of children: 1  . Years of education: 47  . Highest education level: Not on file  Occupational History  . Occupation: Retired  Scientific laboratory technician  . Financial resource strain: Not on file  . Food insecurity:    Worry: Not on file    Inability: Not on file  . Transportation needs:    Medical: No    Non-medical: No  Tobacco Use  . Smoking status: Former Smoker    Last attempt to quit: 09/24/1994    Years since quitting: 23.3  . Smokeless tobacco: Never Used  Substance and Sexual Activity  . Alcohol use: No    Alcohol/week: 0.0 oz    Comment: Quit Oct. 1977 ("somewhat heavy")  . Drug use: No  . Sexual activity: Not on file  Lifestyle  . Physical activity:    Days per week: 3 days    Minutes per session: 10 min  . Stress: Not at all  Relationships  . Social connections:    Talks on phone: Not on file    Gets together: Not on file    Attends religious service: Not on file    Active member of club or organization: Not on file    Attends meetings of clubs or organizations: Not on file    Relationship status: Not on file  . Intimate partner violence:    Fear of current or ex partner: Not on file    Emotionally abused: Not on file    Physically abused: Not on file    Forced sexual activity: Not on file  Other Topics Concern  . Not on file  Social History Narrative   Lives at home with wife.   Caffeine use: Drinks no soda or tea.    Drinks  1 cup coffee/week   Right handed    Allergies   Penicillins  Family history:   Family History  Problem Relation Age of Onset  . Aneurysm Mother   . Heart disease Father   . Stroke Father   . Hypertension Father   . Diabetes Father   . Dementia Neg Hx   . Colon cancer Neg Hx   . Esophageal cancer Neg Hx   . Pancreatic cancer Neg Hx   . Prostate cancer Neg Hx   . Rectal cancer Neg  Hx   . Stomach cancer Neg Hx     Current Medications:   Prior to Admission medications   Medication Sig Start Date End Date Taking? Authorizing Provider  acetaminophen (TYLENOL) 500 MG tablet Take 1,000 mg by mouth 2 (two) times daily as needed (for pain).     [provider]  acetaminophen-codeine (TYLENOL #3) 300-30 MG tablet take 1 tablet by mouth every 4 hours if needed for pain 08/15/16   [provider]  allopurinol (ZYLOPRIM) 100 MG tablet Take 100 mg by mouth daily.      [provider]  Ascorbic Acid (VITAMIN C PO) Take 1 tablet by mouth daily.    [provider]  aspirin 325 MG tablet Take 650 mg by mouth daily.     [provider]  B Complex-C-Zn-Folic Acid (DIALYVITE/ZINC) TABS Take 1 tablet by mouth daily. 10/17/17   [provider]  Blood Glucose Monitoring Suppl (FREESTYLE FREEDOM LITE) w/Device KIT Use to check blood sugar 2 times per day dx code E11.65 03/08/16   Elayne Snare, MD  cephALEXin (KEFLEX) 500 MG capsule Take 1 capsule (500 mg total) by mouth 4 (four) times daily. 01/13/18   Charlann Lange, PA-C  Cholecalciferol (VITAMIN D PO) Take 1 tablet by mouth daily.    [provider]  ciprofloxacin (CIPRO) 500 MG tablet Take 1 tablet (500 mg total) by mouth daily. On dialysis days take after dialysis Patient taking differently: Take 500 mg by mouth every Monday, Wednesday, and Friday. On dialysis days take after dialysis 10/30/17   Blanchie Dessert, MD  clobetasol (TEMOVATE) 0.05 % external solution apply to affected area ON  THE SCALP UP TO 2 TIMES A DAY AS NEEDED...  (REFER TO PRESCRIPTION NOTES). 04/19/15   [provider]  docusate sodium (COLACE) 100 MG capsule Take 1 capsule (100 mg total) by mouth daily. To avoid constipation from pain medication 01/02/18   Tanna Furry, MD  donepezil (ARICEPT) 10 MG tablet Take 1 tablet (10 mg total) by mouth at bedtime. 01/12/16   Melvenia Beam, MD  Fluocinolone Acetonide Body 0.01 % OIL Apply 1 ml on the skin daily 12/26/17   [provider]  FLUOCINOLONE ACETONIDE SCALP 0.01 % OIL apply to affected area ON SCALP EVERY NIGHT AT BEDTIME, [place] UPON AWAKENING 04/28/15   [provider]  fluticasone (CUTIVATE) 0.05 % cream apply to affected area ON FACE UP TO 2 TIMES A DAY AS NEEDED 04/19/15   [provider]  fluticasone (FLONASE) 50 MCG/ACT nasal spray Place 1 spray into both nostrils daily. Patient taking differently: Place 1 spray into both nostrils daily as needed for allergies.  10/08/14   Mikhail, Velta Addison, DO  glucose blood (ONETOUCH VERIO) test strip Use to check blood sugar 2 times per day. Dx code E11.65 03/01/16   Elayne Snare, MD  Hyprom-Naphaz-Polysorb-Zn Sulf (CLEAR EYES COMPLETE OP) Place 1 drop into both eyes 2 (two) times daily as needed (itchy eyes).    [provider]  insulin aspart (NOVOLOG) 100 UNIT/ML injection Inject 10 Units into the skin 3 (three) times daily before meals. Patient taking differently: Inject 10 Units into the skin See admin instructions. Two to three times a day before meals 01/20/16   Elayne Snare, MD  insulin glargine (LANTUS) 100 UNIT/ML injection Inject 0.08 mLs (8 Units total) into the skin every morning. Patient taking differently: Inject 11 Units into the skin daily.  06/06/16   Ghimire, Henreitta Leber, MD  Insulin Pen Needle (  NOVOFINE) 30G X 8 MM MISC Use to inject insulin 3 times daily 04/03/17   Elayne Snare, MD  ketoconazole (NIZORAL) 2 % shampoo Apply 1 application topically 2 (two) times a week. 03/31/15    Elby Showers, MD  Lancets (FREESTYLE) lancets Use as instructed to check blood sugar 2 times per day dx code E11.65 02/22/16   Elayne Snare, MD  levothyroxine (SYNTHROID, LEVOTHROID) 50 MCG tablet Take 1 tablet (50 mcg total) by mouth daily. 08/06/14   Elby Showers, MD  lidocaine-prilocaine (EMLA) cream Apply 1 application topically every Monday, Wednesday, and Friday. AS DIRECTED 03/29/15   [provider]  loratadine (CLARITIN) 10 MG tablet Take 10 mg by mouth daily as needed for allergies.     [provider]  methocarbamol (ROBAXIN) 500 MG tablet Take 1 tablet (500 mg total) by mouth at bedtime as needed for muscle spasms. 01/02/18   Tanna Furry, MD  metroNIDAZOLE (METROGEL) 0.75 % gel APPLY TO AFFECTED AREA ON SKIN  ON FACE TWICE DAILY 06/14/15   [provider]  Multiple Vitamins-Minerals (ZINC PO) Take 1 tablet by mouth daily.    [provider]  mupirocin ointment (BACTROBAN) 2 % Use as directed to affected areas Patient taking differently: Apply 1 application topically 3 (three) times daily as needed (to affected areas).  09/04/16   Elby Showers, MD  Naftifine HCl (NAFTIN) 2 % CREA Apply to feet daily for fungal infection Patient taking differently: Apply topically daily as needed (for fungal infection). To feet 09/12/15   Elby Showers, MD  nystatin-triamcinolone (MYCOLOG II) cream Apply 1 application topically 2 (two) times daily. 09/26/17   Elby Showers, MD  omeprazole (PRILOSEC) 40 MG capsule Take 1 capsule (40 mg total) by mouth daily. 10/17/17   Esterwood, Amy S, PA-C  oxyCODONE (ROXICODONE) 5 MG immediate release tablet Take 1 tablet (5 mg total) by mouth every 4 (four) hours as needed for severe pain. 01/02/18   Tanna Furry, MD  oxyCODONE-acetaminophen (PERCOCET/ROXICET) 5-325 MG tablet Take 1-2 tablets by mouth every 6 (six) hours as needed for severe pain. 10/30/17   Blanchie Dessert, MD  pioglitazone (ACTOS) 30 MG tablet take 1 tablet by mouth  once daily Patient taking differently: take 1 tablet (30 mg)  by mouth once daily 05/30/17   Elayne Snare, MD  Probiotic Product (PROBIOTIC PO) Take 1 tablet by mouth daily.    [provider]  promethazine (PHENERGAN) 12.5 MG tablet Take 1 tablet (12.5 mg total) by mouth every 12 (twelve) hours as needed for nausea or vomiting. 08/14/17   Elby Showers, MD  RENVELA 800 MG tablet Take 800-1,600 mg by mouth See admin instructions. Take 1 tablet with breakfast and at lunch then take 2 tablets at dinner and 800 mg with each snack 07/27/14   [provider]  SENSIPAR 30 MG tablet Take 30 mg by mouth daily.  12/01/15   [provider]  simvastatin (ZOCOR) 20 MG tablet Take 20 mg by mouth at bedtime.     [provider]  Teriparatide, Recombinant, (FORTEO) 600 MCG/2.4ML SOLN Inject 0.08 mLs (20 mcg total) into the skin daily. 01/09/18   Elayne Snare, MD  traMADol (ULTRAM) 50 MG tablet Take 1 tablet (50 mg total) by mouth every 12 (twelve) hours as needed. 11/07/17   Elby Showers, MD  triamcinolone ointment (KENALOG) 0.1 % Apply 1 application topically 2 (two) times daily. Use between thighs    [provider]  Physical Exam:   Vitals:   01/17/18 0600 01/17/18 0630 01/17/18 0830 01/17/18 0930  BP:  129/66 (!) 121/92 126/77  Pulse: 74 76 78 76  Resp: 14 20 17 16   Temp:      TempSrc:      SpO2: 100% 93% 96% 98%  Weight:      Height:         Physical Exam: Blood pressure 126/77, pulse 76, temperature 98.5 F (36.9 C), temperature source Oral, resp. rate 16, height 5' 11"  (1.803 m), weight 81.2 kg (179 lb), SpO2 98 %. Gen: comfortable-appearing man lying flat in bed in no distress Eyes: Sclerae anicteric. Conjunctiva mildly injected. Neck: Supple, no jugular venous distention. Chest: Moderately good air entry bilaterally with no adventitious sounds.  CV: Distant, regular, no audible murmurs. Abdomen: NABS, soft, nondistended, nontender. No tenderness  to light or deep palpation. No rebound, no guarding. Extremities: No edema.  Skin: Warm and dry. No rashes, lesions or wounds. Neuro: Alert and oriented times 3; grossly nonfocal. Psych: Patient is cooperative, logical and coherent with appropriate mood and affect.  Data Review:    Labs: Basic Metabolic Panel: Recent Labs  Lab 01/13/18 0045 01/17/18 0104  NA 136 138  K 3.7 3.6  CL 94* 94*  CO2 28 28  GLUCOSE 144* 104*  BUN 28* 23*  CREATININE 7.18* 7.05*  CALCIUM 9.0 9.6   Liver Function Tests: Recent Labs  Lab 01/13/18 0045 01/17/18 0104  AST 44* 34  ALT 20 18  ALKPHOS 117 127*  BILITOT 0.8 0.9  PROT 5.3* 5.7*  ALBUMIN 2.2* 2.4*   No results for input(s): LIPASE, AMYLASE in the last 168 hours. No results for input(s): AMMONIA in the last 168 hours. CBC: Recent Labs  Lab 01/13/18 0045 01/17/18 0104  WBC 14.6* 11.0*  NEUTROABS 12.9* 7.9*  HGB 8.8* 9.2*  HCT 25.9* 27.9*  MCV 99.6 102.6*  PLT 309 327   Cardiac Enzymes: Recent Labs  Lab 01/13/18 0045  TROPONINI <0.03    BNP (last 3 results) No results for input(s): PROBNP in the last 8760 hours. CBG: Recent Labs  Lab 01/17/18 0143 01/17/18 0312 01/17/18 0517 01/17/18 0638 01/17/18 0724  GLUCAP 88 141* 113* 71 116*    Urinalysis    Component Value Date/Time   COLORURINE YELLOW 01/13/2018 0045   APPEARANCEUR CLOUDY (A) 01/13/2018 0045   LABSPEC 1.014 01/13/2018 0045   PHURINE 8.0 01/13/2018 0045   GLUCOSEU NEGATIVE 01/13/2018 0045   HGBUR SMALL (A) 01/13/2018 0045   BILIRUBINUR NEGATIVE 01/13/2018 0045   BILIRUBINUR neg 02/14/2015 1115   KETONESUR NEGATIVE 01/13/2018 0045   PROTEINUR 100 (A) 01/13/2018 0045   UROBILINOGEN 0.2 02/20/2015 0944   NITRITE NEGATIVE 01/13/2018 0045   LEUKOCYTESUR LARGE (A) 01/13/2018 0045      Radiographic Studies: No results found.  EKG: EKG was not done today   Assessment/Plan:   Principal Problem:   Hypoglycemia Active Problems:   Insulin  dependent diabetes mellitus (HCC)   Hyperlipidemia   Essential hypertension   Hypothyroidism   ESRD on dialysis ([place])   Low back pain  HYPOGLYCEMIA/DM Of note patient has not had any hypoglycemia since admission,he last took his basal insulin 24 hours ago. I am holding patient's glargine and his aspart while he is in house Will check fingersticks every 2 hours with sliding scale coverage as needed This will give [place] a sense of what his 24 hour insulin needs are Of note patient states that he's had markedly  decreased by mouth intake over the past month or so Certainly being started on oxycodone will cause appetite suppression It may well be that his hypoglycemia secondary to his decreased by mouth intake while on his stable dose of insulin I am providing patient with a renal diet and will request dietary to look and see how much she is actually eating and a day  ESRD Spoke with renal consult re-need for routine dialysis today Patient is dialyzed Monday Wednesday Friday.  BACK PAIN Patient has prescriptions for both oxycodone as well as hydrocodone, e'll continue when necessary hydrocodone  HYPOTHYROIDISM Continue Synthroid  HL Continue Zocor  GERD Continue PPI  DEMENTIA Continue donepezil       Other information:   DVT prophylaxis: Lovenox ordered. Code Status: Full code. Family Communication: patient's wife is aware the patient is in house  Disposition Plan: home Consults called: renal Admission status: observation   The medical decision making on this patient was of high complexity and the patient is at high risk for clinical deterioration, therefore this is a level 3 visit.  Dewaine Oats Tublu Chatterjee [facility]  If 7PM-7AM, please contact night-coverage [email or web address] Password [facility] 01/17/2018, 9:58 AM

## 2018-01-17 NOTE — Procedures (Signed)
Initiating treatment via RUE AVF  No instability He is alert and appropriate. Erling Cruz, MD

## 2018-01-17 NOTE — Telephone Encounter (Signed)
PT wife called and inquired about her husbands insulin dosages.  Wife states he has been getting Lantus 10 unit and Novolog 8 unit in the morning and Novolog 8 units at night.  Also states that on 01/12/18 blood sugar dropped and he went to hospital and was not admitted, but diagnosed with UTI and and sent home with ABT. Blood sugars were reportedly good from 01/13/18-01/15/18 On the morning of 01/16/18 blood sugar was noted to be 76 upon waking. Before dinner @ 2100, sugar was 90. Pt ate well at dinner   At 2130, pt asked for glass of orange juice. Wife checked sugar and sugar was 26 Pt drank another glass of OJ.  Wife then continued to check sugars and they were as follows; 26 69 51 77 64 Drank glass of milk and ate spoonful of Peanut butter 40 45  Called MD emergency line at which time emergency line advise pt wife to call 911. -Pt was admitted to hospital.   Please call at cell [phone]

## 2018-01-17 NOTE — ED Provider Notes (Signed)
Patient seen/examined in the Emergency Department in conjunction with Midlevel Provider [place] Patient reports episodes of hypoglycemia Exam : Awake alert, no distress Plan: Due to increasing frequency of hypoglycemia despite med compliance, will need to be admitted   Ripley Fraise, MD 01/17/18 [phone]

## 2018-01-17 NOTE — ED Notes (Signed)
Pt given apple juice and apple sauce. MD aware of CBG.

## 2018-01-17 NOTE — Telephone Encounter (Signed)
[facility] called to inquire about the status of PA. They were informed that PA was completed and faxed to [facility] as of 01/16/18. [facility] verbalized understanding and stated they would check back with [place] around Tuesday of next week to see if any progress has been made.

## 2018-01-17 NOTE — Progress Notes (Signed)
[facility] Brief Note  [name] is 77 year old male with ESRD on HD (MWF at [facility]). PMH also significant for DM Type 2, HTN, HLD, PAD, hypothyroidism, gout, L3 compression fx s/p vertebroplasty    He is admitted under observation status for further management of hypoglycemia. He was seen in the ED earlier this week with similar complaint. Dr. Dwyane Dee manages his insulin and says his meds were adjusted after that incident. He reports normal blood sugar readings until yesterday when he felt bad after eating dinner and had some visual disturbances that he usually gets when his sugar drops.  Reports blood sugar around 20 and didn't improve much with juice,candy so called EMS. Treated with dextrose.  Labs K 3.6, Na 138, Glucose 104, Hgb 9.2   He is due for routine dialysis today.   Dialysis Orders: [facility] M[facility] 4h 180NRe 400/800 EDW 80kg 2K/2Ca  RUE AVF Hep 5000  -Orders written for HD today -Full consult if changes to inpatient status    [name] Pager [phone] 01/17/2018,12:19 PM

## 2018-01-17 NOTE — Progress Notes (Signed)
Patient trasfered from ED to [phone] via bed; alert and oriented x 4; no complaints of pain; IV saline locked in LFA. Orient patient to room and unit; watch safety video; gave patient care guide; instructed how to use the call bell and  fall risk precautions. Will continue to monitor the patient.

## 2018-01-18 DIAGNOSIS — E162 Hypoglycemia, unspecified: Secondary | ICD-10-CM

## 2018-01-18 DIAGNOSIS — E11649 Type 2 diabetes mellitus with hypoglycemia without coma: Secondary | ICD-10-CM | POA: Diagnosis not present

## 2018-01-18 LAB — GLUCOSE, CAPILLARY
Glucose-Capillary: 87 mg/dL (ref 65–99)
Glucose-Capillary: 76 mg/dL (ref 65–99)
Glucose-Capillary: 88 mg/dL (ref 65–99)
Glucose-Capillary: 119 mg/dL — ABNORMAL HIGH (ref 65–99)
Glucose-Capillary: 100 mg/dL — ABNORMAL HIGH (ref 65–99)
Glucose-Capillary: 151 mg/dL — ABNORMAL HIGH (ref 65–99)
Glucose-Capillary: 108 mg/dL — ABNORMAL HIGH (ref 65–99)

## 2018-01-18 LAB — BASIC METABOLIC PANEL
Anion gap: 10 (ref 5–15)
BUN: 9 mg/dL (ref 6–20)
CO2: 32 mmol/L (ref 22–32)
Calcium: 8.5 mg/dL — ABNORMAL LOW (ref 8.9–10.3)
Chloride: 95 mmol/L — ABNORMAL LOW (ref 101–111)
Creatinine, Ser: 4.27 mg/dL — ABNORMAL HIGH (ref 0.61–1.24)
GFR calc Af Amer: 14 mL/min — ABNORMAL LOW (ref >60)
GFR calc non Af Amer: 12 mL/min — ABNORMAL LOW (ref >60)
Glucose, Bld: 81 mg/dL (ref 65–99)
Potassium: 4 mmol/L (ref 3.5–5.1)
Sodium: 137 mmol/L (ref 135–145)

## 2018-01-18 LAB — CBC
HCT: 25 % — ABNORMAL LOW (ref 39.0–52.0)
Hemoglobin: 8.3 g/dL — ABNORMAL LOW (ref 13.0–17.0)
MCH: 33.6 pg (ref 26.0–34.0)
MCHC: 33.2 g/dL (ref 30.0–36.0)
MCV: 101.2 fL — ABNORMAL HIGH (ref 78.0–100.0)
Platelets: 265 10*3/uL (ref 150–400)
RBC: 2.47 MIL/uL — ABNORMAL LOW (ref 4.22–5.81)
RDW: 18.9 % — ABNORMAL HIGH (ref 11.5–15.5)
WBC: 8.6 10*3/uL (ref 4.0–10.5)

## 2018-01-18 LAB — HEMOGLOBIN A1C
Hgb A1c MFr Bld: 5.4 % (ref 4.8–5.6)
Mean Plasma Glucose: 108.28 mg/dL

## 2018-01-18 MED ORDER — ZIPRASIDONE MESYLATE 20 MG IM SOLR
10.0000 mg | Freq: Once | INTRAMUSCULAR | Status: DC
  Filled 2018-01-18: qty 20

## 2018-01-18 MED ORDER — SITAGLIPTIN PHOSPHATE 25 MG PO TABS: 25.0000 mg | ORAL_TABLET | Freq: Every day | ORAL | 0 refills | Status: DC

## 2018-01-18 NOTE — Progress Notes (Addendum)
S/p HD yesterday. Net UF 1 .1 L . Unintentional weight loss.  Insulin being lowered. For f/u with PCP this week.  There appears to be a component of early dementia. Discussed with wife.   We will follow up at dialysis next Monday at [facility]. Noted hgb 8.3 down from 9.2 yesterday. Need to f/u hgbafter d/c at dialysis  Amalia Hailey, PA-C  I agree with note as articulated above. Erling Cruz, MD

## 2018-01-18 NOTE — Progress Notes (Signed)
Assessment: 1) ESRD 2) Hypoglycemia, improved  Subjective: Interval History: BS 70 to 100 since yesterday; 1100cc off w/ HD yesterday  Objective: Vital signs in last 24 hours: Temp:  [98.1 F (36.7 C)-98.9 F (37.2 C)] 98.9 F (37.2 C) (04/27 0503) Pulse Rate:  [73-104] 80 (04/27 0540) Resp:  [8-23] 18 (04/27 0503) BP: (103-221)/(27-111) 128/60 (04/27 0540) SpO2:  [95 %-100 %] 97 % (04/27 0540) Weight:  [81 kg (178 lb 9.2 oz)-82.1 kg (181 lb)] 81 kg (178 lb 9.2 oz) (04/27 0130) Weight change: 0.906 kg (2 lb)  Intake/Output from previous day: 04/26 0701 - 04/27 0700 In: -  Out: 1136  Intake/Output this shift: No intake/output data recorded.  General appearance: agitation and confusion today  No LE edema  Lab Results: Recent Labs    01/17/18 0104 01/18/18 0603  WBC 11.0* 8.6  HGB 9.2* 8.3*  HCT 27.9* 25.0*  PLT 327 265   BMET:  Recent Labs    01/17/18 0104 01/18/18 0603  NA 138 137  K 3.6 4.0  CL 94* 95*  CO2 28 32  GLUCOSE 104* 81  BUN 23* 9  CREATININE 7.05* 4.27*  CALCIUM 9.6 8.5*   No results for input(s): PTH in the last 72 hours. Iron Studies: No results for input(s): IRON, TIBC, TRANSFERRIN, FERRITIN in the last 72 hours. Studies/Results: No results found.  Scheduled: . aspirin  325 mg Oral Daily  . cephALEXin  500 mg Oral Q12H  . cinacalcet  30 mg Oral Q breakfast  . docusate sodium  100 mg Oral Daily  . donepezil  10 mg Oral QHS  . heparin  5,000 Units Subcutaneous Q8H  . heparin  5,000 Units Dialysis Once in dialysis  . levothyroxine  50 mcg Oral QAC breakfast  . metroNIDAZOLE   Topical BID  . pantoprazole  40 mg Oral Daily  . sevelamer carbonate  800 mg Oral TID WC  . simvastatin  20 mg Oral QHS  . sodium chloride flush  3 mL Intravenous Q12H  . triamcinolone ointment  1 application Topical BID  . ziprasidone  10 mg Intramuscular Once     LOS: 0 days   [name] 01/18/2018,10:26 AM

## 2018-01-18 NOTE — Progress Notes (Signed)
Initial Nutrition Assessment  DOCUMENTATION CODES:   Not applicable  INTERVENTION:  Upon discharge home, continue Nepro Shake po 2-3 times daily, each supplement provides 425 kcal and 19 grams protein.  Encourage adequate PO intake.   NUTRITION DIAGNOSIS:   Increased nutrient needs related to chronic illness(ESRD) as evidenced by estimated needs.  GOAL:   Patient will meet greater than or equal to 90% of their needs  MONITOR:   PO intake, Labs, Weight trends, I & O's, Skin  REASON FOR ASSESSMENT:   Consult Poor PO  ASSESSMENT:   77 y.o. male with past medical history significant for mild cognitive impairment, insulin dependent diabetes mellitus, end-stage renal disease on hemodialysis and hypertension who is admitted for hypoglycemia. atient has been to the ED 3 times in the past couple of weeks complaining of hypoglycemia   Meal completion at lunch 50%. Pt did reports 0% intake at breakfast this AM. Family at bedside has been encouraging po intake. Pt reports he has been consuming 1 meal a day at home, however does consume 1-2 Nepro shakes a day. Wife reports they are motivated to ensure pt consumes adequate 3 meals a day to prevent hypoglycemia. Plans for discharge home today. Recommend continuation of Nepro shake at least 2-3 times daily to aid in caloric and protein needs. Labs and medications reviewed.   NUTRITION - FOCUSED PHYSICAL EXAM:    Most Recent Value  Orbital Region  Unable to assess  Upper Arm Region  No depletion  Thoracic and Lumbar Region  No depletion  Buccal Region  Unable to assess  Temple Region  Unable to assess  Clavicle Bone Region  Moderate depletion  Clavicle and Acromion Bone Region  Moderate depletion  Scapular Bone Region  Unable to assess  Dorsal Hand  Unable to assess  Patellar Region  No depletion  Anterior Thigh Region  No depletion  Posterior Calf Region  No depletion  Edema (RD Assessment)  Mild  Hair  Reviewed  Eyes  Reviewed   Mouth  Reviewed  Skin  Reviewed  Nails  Reviewed       Diet Order:  Diet renal/carb modified with fluid restriction Diet-HS Snack? Nothing; Fluid restriction: 1200 mL Fluid; Room service appropriate? Yes; Fluid consistency: Thin Diet - low sodium heart healthy  EDUCATION NEEDS:   Education needs have been addressed  Skin:  Skin Assessment: Reviewed RN Assessment  Last BM:  4/25  Height:   Ht Readings from Last 1 Encounters:  01/17/18 5\' 11"  (1.803 m)    Weight:   Wt Readings from Last 1 Encounters:  01/18/18 178 lb 9.2 oz (81 kg)    Ideal Body Weight:  78 kg  BMI:  Body mass index is 24.91 kg/m.  Estimated Nutritional Needs:   Kcal:  2050-2200  Protein:  105-115 grams  Fluid:  1.2 L/day    Corrin Parker, MS, RD, LDN Pager # [phone] After hours/ weekend pager # [phone]

## 2018-01-18 NOTE — Discharge Instructions (Signed)
Hypoglycemia Hypoglycemia is when the sugar (glucose) level in the blood is too low. Symptoms of low blood sugar may include:  Feeling: ? Hungry. ? Worried or nervous (anxious). ? Sweaty and clammy. ? Confused. ? Dizzy. ? Sleepy. ? Sick to your stomach (nauseous).  Having: ? A fast heartbeat. ? A headache. ? A change in your vision. ? Jerky movements that you cannot control (seizure). ? Nightmares. ? Tingling or no feeling (numbness) around the mouth, lips, or tongue.  Having trouble with: ? Talking. ? Paying attention (concentrating). ? Moving (coordination). ? Sleeping.  Shaking.  Passing out (fainting).  Getting upset easily (irritability).  Low blood sugar can happen to people who have diabetes and people who do not have diabetes. Low blood sugar can happen quickly, and it can be an emergency. Treating Low Blood Sugar Low blood sugar is often treated by eating or drinking something sugary right away. If you can think clearly and swallow safely, follow the 15:15 rule:  Take 15 grams of a fast-acting carb (carbohydrate). Some fast-acting carbs are: ? 1 tube of glucose gel. ? 3 sugar tablets (glucose pills). ? 6-8 pieces of hard candy. ? 4 oz (120 mL) of fruit juice. ? 4 oz (120 mL) of regular (not diet) soda.  Check your blood sugar 15 minutes after you take the carb.  If your blood sugar is still at or below 70 mg/dL (3.9 mmol/L), take 15 grams of a carb again.  If your blood sugar does not go above 70 mg/dL (3.9 mmol/L) after 3 tries, get help right away.  After your blood sugar goes back to normal, eat a meal or a snack within 1 hour.  Treating Very Low Blood Sugar If your blood sugar is at or below 54 mg/dL (3 mmol/L), you have very low blood sugar (severe hypoglycemia). This is an emergency. Do not wait to see if the symptoms will go away. Get medical help right away. Call your local emergency services (911 in the [place] Do not drive yourself to the  hospital. If you have very low blood sugar and you cannot eat or drink, you may need a glucagon shot (injection). A family member or friend should learn how to check your blood sugar and how to give you a glucagon shot. Ask your doctor if you need to have a glucagon shot kit at home. Follow these instructions at home: General instructions  Avoid any diets that cause you to not eat enough food. Talk with your doctor before you start any new diet.  Take over-the-counter and prescription medicines only as told by your doctor.  Limit alcohol to no more than 1 drink per day for nonpregnant women and 2 drinks per day for men. One drink equals 12 oz of beer, 5 oz of wine, or 1 oz of hard liquor.  Keep all follow-up visits as told by your doctor. This is important. If You Have Diabetes:   Make sure you know the symptoms of low blood sugar.  Always keep a source of sugar with you, such as: ? Sugar. ? Sugar tablets. ? Glucose gel. ? Fruit juice. ? Regular soda (not diet soda). ? Milk. ? Hard candy. ? Honey.  Take your medicines as told.  Follow your exercise and meal plan. ? Eat on time. Do not skip meals. ? Follow your sick day plan when you cannot eat or drink normally. Make this plan ahead of time with your doctor.  Check your blood sugar as often  as told by your doctor. Always check before and after exercise.  Share your diabetes care plan with: ? Your work or school. ? People you live with.  Check your pee (urine) for ketones: ? When you are sick. ? As told by your doctor.  Carry a card or wear jewelry that says you have diabetes. If You Have Low Blood Sugar From Other Causes:   Check your blood sugar as often as told by your doctor.  Follow instructions from your doctor about what you cannot eat or drink. Contact a doctor if:  You have trouble keeping your blood sugar in your target range.  You have low blood sugar often. Get help right away if:  You still have  symptoms after you eat or drink something sugary.  Your blood sugar is at or below 54 mg/dL (3 mmol/L).  You have jerky movements that you cannot control.  You pass out. These symptoms may be an emergency. Do not wait to see if the symptoms will go away. Get medical help right away. Call your local emergency services (911 in the [place] Do not drive yourself to the hospital. This information is not intended to replace advice given to you by your health care provider. Make sure you discuss any questions you have with your health care provider. Document Released: 12/05/2009 Document Revised: 02/16/2016 Document Reviewed: 10/14/2015 [employer] Interactive Patient Education  [employer].

## 2018-01-18 NOTE — Discharge Summary (Signed)
Physician Discharge Summary  [name] HTD:[id number] DOB: 1941-07-09 DOA: 01/17/2018  PCP: Elby Showers, MD  Admit date: 01/17/2018 Discharge date: 01/18/2018  Admitted From: Home Disposition:  Home  Recommendations for Outpatient Follow-up:  1. Follow up with PCP in 1-2 weeks 2. Please obtain BMP/CBC in one week 3. Please follow up on the following pending results: Blood sugar checks  Home Health: No Equipment/Devices: No  Discharge Condition: Stable CODE STATUS: Full) Diet recommendation: Renal diet  Brief/Interim Summary  #) Hypoglycemia in the setting of type 2 diabetes with insulin use: Patient had 3 ER visits for hypoglycemia in the setting of insulin use.  His PCP and his endocrinologist was slowly decreasing the dose of his insulin.  An A1c checked here was 5.4.  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  A fructosamine was not ordered this this is a send out lab.  On discussing with the wife it was decided to completely discontinue patient's insulin and start sitagliptin 25 mg daily.  Patient was told to check blood sugars frequently at home and report back to PCP.  He was told to resume his home pioglitazone.   #) ESRD: Patient was maintained on regular dialysis schedule here.  Patient was continued on home cinacalcet 30 mg with breakfast.  Patient was continued on home sevelamer.  #) Osteoporosis: Patient was continued on home teriparatide daily.  #) Hyperlipidemia: Patient was continued on statin and aspirin 325.  #) Hypothyroidism: Patient was continued on levothyroxine.  #) dementia: Patient was continued on donepezil 10 mg daily.  Patient on the morning after admission became quite combative and suspicious.  Suspect this was likely delirium on top of his dementia.  Discussed with his wife that he should see a PCP as an outpatient for his worsening dementia and to discuss whether any  medical or therapeutic options are available.  #) Gout: Patient was continued on allopurinol.   Discharge Diagnoses:  Principal Problem:   Hypoglycemia Active Problems:   Insulin dependent diabetes mellitus (HCC)   Hyperlipidemia   Essential hypertension   Hypothyroidism   ESRD on dialysis ([place])   Low back pain    Discharge Instructions  Discharge Instructions    Call MD for:  difficulty breathing, headache or visual disturbances   Complete by:  As directed    Call MD for:  hives   Complete by:  As directed    Call MD for:  persistant nausea and vomiting   Complete by:  As directed    Call MD for:  redness, tenderness, or signs of infection (pain, swelling, redness, odor or green/yellow discharge around incision site)   Complete by:  As directed    Call MD for:  severe uncontrolled pain   Complete by:  As directed    Call MD for:  temperature >100.4   Complete by:  As directed    Diet - low sodium heart healthy   Complete by:  As directed    Discharge instructions   Complete by:  As directed    Please do not take any insulin at home.  Please check your blood sugars frequently at home.  Please follow-up with your primary care doctor and endocrinologist about restarting insulin.   Increase activity slowly   Complete by:  As directed      Allergies as of 01/18/2018      Reactions   Penicillins Rash  Has patient had a PCN reaction causing immediate rash, facial/tongue/throat swelling, SOB or lightheadedness with hypotension: Yes Has patient had a PCN reaction causing severe rash involving mucus membranes or skin necrosis: Yes Has patient had a PCN reaction that required hospitalization: No Has patient had a PCN reaction occurring within the last 10 years: No If all of the above answers are "NO", then may proceed with Cephalosporin use.      Medication List    STOP taking these medications   insulin aspart 100 UNIT/ML injection Commonly known as:  novoLOG    insulin glargine 100 UNIT/ML injection Commonly known as:  LANTUS     TAKE these medications   acetaminophen 500 MG tablet Commonly known as:  TYLENOL Take 1,000 mg by mouth 2 (two) times daily as needed (for pain).   acetaminophen-codeine 300-30 MG tablet Commonly known as:  TYLENOL #3 take 1 tablet by mouth every 4 hours if needed for pain   allopurinol 100 MG tablet Commonly known as:  ZYLOPRIM Take 100 mg by mouth daily as needed (pain).   aspirin 325 MG tablet Take 325 mg by mouth daily.   cephALEXin 500 MG capsule Commonly known as:  KEFLEX Take 1 capsule (500 mg total) by mouth 4 (four) times daily.   ciprofloxacin 500 MG tablet Commonly known as:  CIPRO Take 1 tablet (500 mg total) by mouth daily. On dialysis days take after dialysis   clobetasol 0.05 % external solution Commonly known as:  TEMOVATE apply to affected area ON THE SCALP UP TO 2 TIMES A DAY AS NEEDED...  (REFER TO PRESCRIPTION NOTES).   DIALYVITE/ZINC Tabs Take 1 tablet by mouth daily.   docusate sodium 100 MG capsule Commonly known as:  COLACE Take 1 capsule (100 mg total) by mouth daily. To avoid constipation from pain medication   donepezil 10 MG tablet Commonly known as:  ARICEPT Take 1 tablet (10 mg total) by mouth at bedtime.   Fluocinolone Acetonide Scalp 0.01 % Oil apply to affected area ON SCALP EVERY NIGHT AT BEDTIME, WASH UPON AWAKENING   Fluocinolone Acetonide Body 0.01 % Oil Apply 1 ml on the skin daily   fluticasone 0.05 % cream Commonly known as:  CUTIVATE apply to affected area ON FACE UP TO 2 TIMES A DAY AS NEEDED   fluticasone 50 MCG/ACT nasal spray Commonly known as:  FLONASE Place 1 spray into both nostrils daily. What changed:    when to take this  reasons to take this   FREESTYLE FREEDOM LITE w/Device Kit Use to check blood sugar 2 times per day dx code E11.65   freestyle lancets Use as instructed to check blood sugar 2 times per day dx code E11.65    glucose blood test strip Commonly known as:  ONETOUCH VERIO Use to check blood sugar 2 times per day. Dx code E11.65   Insulin Pen Needle 30G X 8 MM Misc Commonly known as:  NOVOFINE Use to inject insulin 3 times daily   ketoconazole 2 % shampoo Commonly known as:  NIZORAL Apply 1 application topically 2 (two) times a week.   levothyroxine 50 MCG tablet Commonly known as:  SYNTHROID, LEVOTHROID Take 1 tablet (50 mcg total) by mouth daily.   lidocaine-prilocaine cream Commonly known as:  EMLA Apply 1 application topically every Monday, Wednesday, and Friday. AS DIRECTED   loratadine 10 MG tablet Commonly known as:  CLARITIN Take 10 mg by mouth daily as needed for allergies.   methocarbamol 500 MG  tablet Commonly known as:  ROBAXIN Take 1 tablet (500 mg total) by mouth at bedtime as needed for muscle spasms.   metroNIDAZOLE 0.75 % gel Commonly known as:  METROGEL APPLY TO AFFECTED AREA ON SKIN  ON FACE TWICE DAILY   mupirocin ointment 2 % Commonly known as:  BACTROBAN Use as directed to affected areas What changed:    how much to take  how to take this  when to take this  reasons to take this  additional instructions   Naftifine HCl 2 % Crea Commonly known as:  NAFTIN Apply to feet daily for fungal infection What changed:    how to take this  when to take this  reasons to take this  additional instructions   nystatin-triamcinolone cream Commonly known as:  MYCOLOG II Apply 1 application topically 2 (two) times daily.   omeprazole 40 MG capsule Commonly known as:  PRILOSEC Take 1 capsule (40 mg total) by mouth daily.   oxyCODONE 5 MG immediate release tablet Commonly known as:  ROXICODONE Take 1 tablet (5 mg total) by mouth every 4 (four) hours as needed for severe pain.   oxyCODONE-acetaminophen 5-325 MG tablet Commonly known as:  PERCOCET/ROXICET Take 1-2 tablets by mouth every 6 (six) hours as needed for severe pain.   pioglitazone 30 MG  tablet Commonly known as:  ACTOS take 1 tablet by mouth once daily   PROBIOTIC PO Take 1 tablet by mouth daily.   promethazine 12.5 MG tablet Commonly known as:  PHENERGAN Take 1 tablet (12.5 mg total) by mouth every 12 (twelve) hours as needed for nausea or vomiting.   RENVELA 800 MG tablet Generic drug:  sevelamer carbonate Take 800-1,600 mg by mouth See admin instructions. Take 1 tablet with breakfast and at lunch then take 2 tablets at dinner and 800 mg with each snack   SENSIPAR 30 MG tablet Generic drug:  cinacalcet Take 30 mg by mouth daily.   simvastatin 20 MG tablet Commonly known as:  ZOCOR Take 20 mg by mouth at bedtime.   sitaGLIPtin 25 MG tablet Commonly known as:  JANUVIA Take 1 tablet (25 mg total) by mouth daily.   Teriparatide (Recombinant) 600 MCG/2.4ML Soln Commonly known as:  FORTEO Inject 0.08 mLs (20 mcg total) into the skin daily.   traMADol 50 MG tablet Commonly known as:  ULTRAM Take 1 tablet (50 mg total) by mouth every 12 (twelve) hours as needed.   triamcinolone ointment 0.1 % Commonly known as:  KENALOG Apply 1 application topically 2 (two) times daily. Use between thighs   VITAMIN A PO Take 1 tablet by mouth daily.   VITAMIN C PO Take 1 tablet by mouth daily.       Allergies  Allergen Reactions  . Penicillins Rash    Has patient had a PCN reaction causing immediate rash, facial/tongue/throat swelling, SOB or lightheadedness with hypotension: Yes Has patient had a PCN reaction causing severe rash involving mucus membranes or skin necrosis: Yes Has patient had a PCN reaction that required hospitalization: No Has patient had a PCN reaction occurring within the last 10 years: No If all of the above answers are "NO", then may proceed with Cephalosporin use.     Consultations:  Nephrology   Procedures/Studies: Dg Chest 2 View  Result Date: 01/12/2018 CLINICAL DATA:  Back pain from old fracture, shortness of breath with chest  pain EXAM: CHEST - 2 VIEW COMPARISON:  01/02/2018 FINDINGS: No acute pulmonary infiltrate or effusion. Mild bronchitic  changes and hyperinflation. Stable cardiomediastinal silhouette with aortic atherosclerosis. No pneumothorax. IMPRESSION: No active cardiopulmonary disease.  Mild bronchitic changes Electronically Signed   By: Donavan Foil M.D.   On: 01/12/2018 23:59   Dg Chest 2 View  Result Date: 01/02/2018 CLINICAL DATA:  Cough with shortness of breath, history of fall EXAM: CHEST - 2 VIEW COMPARISON:  01/29/2017 FINDINGS: Tiny pleural effusions. No focal airspace disease. Normal heart size. Aortic atherosclerosis. No pneumothorax. IMPRESSION: 1. Tiny pleural effusion. 2. No focal airspace disease or pneumothorax. Electronically Signed   By: Donavan Foil M.D.   On: 01/02/2018 14:13   Dg Thoracic Spine 2 View  Result Date: 01/02/2018 CLINICAL DATA:  Back pain after fall. History of prior compression deformities and kyphoplasty. EXAM: THORACIC SPINE 2 VIEWS; LUMBAR SPINE - COMPLETE 4+ VIEW COMPARISON:  CT chest, abdomen, and pelvis dated October 30, 2017. FINDINGS: Evaluation is significantly limited due to cross-table positioning on the lateral views. The upper and midthoracic spine are not well evaluated. Thoracic spine: Twelve rib-bearing thoracic vertebral bodies. No definite acute fracture or subluxation. Lower thoracic vertebral body heights are preserved. Alignment is normal. Mild multilevel degenerative changes. Lumbar spine: Five lumbar type vertebral bodies. New mild superior endplate compression deformities of L1 and L2. Old compression deformities of L3 and L5 status post cement augmentation at L3. Unchanged grade 1 anterolisthesis at L4-L5. Mild multilevel degenerative disc disease and facet arthropathy, similar to prior study. The sacroiliac joints are intact. Diffuse osteopenia. Aortic atherosclerosis. IMPRESSION: 1. New mild superior endplate compression fractures of L1 and L2, likely  acute given clinical history. 2. 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 3. Evaluation of the upper and midthoracic spine is limited due to cross-table positioning on the lateral views. No definite acute fracture in the thoracic spine. Electronically Signed   By: Titus Dubin M.D.   On: 01/02/2018 14:18   Dg Lumbar Spine Complete  Result Date: 01/02/2018 CLINICAL DATA:  Back pain after fall. History of prior compression deformities and kyphoplasty. EXAM: THORACIC SPINE 2 VIEWS; LUMBAR SPINE - COMPLETE 4+ VIEW COMPARISON:  CT chest, abdomen, and pelvis dated October 30, 2017. FINDINGS: Evaluation is significantly limited due to cross-table positioning on the lateral views. The upper and midthoracic spine are not well evaluated. Thoracic spine: Twelve rib-bearing thoracic vertebral bodies. No definite acute fracture or subluxation. Lower thoracic vertebral body heights are preserved. Alignment is normal. Mild multilevel degenerative changes. Lumbar spine: Five lumbar type vertebral bodies. New mild superior endplate compression deformities of L1 and L2. Old compression deformities of L3 and L5 status post cement augmentation at L3. Unchanged grade 1 anterolisthesis at L4-L5. Mild multilevel degenerative disc disease and facet arthropathy, similar to prior study. The sacroiliac joints are intact. Diffuse osteopenia. Aortic atherosclerosis. IMPRESSION: 1. New mild superior endplate compression fractures of L1 and L2, likely acute given clinical history. 2. 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 3. Evaluation of the upper and midthoracic spine is limited due to cross-table positioning on the lateral views. No definite acute fracture in the thoracic spine. Electronically Signed   By: Titus Dubin M.D.   On: 01/02/2018 14:18    (Echo, Carotid, EGD, Colonoscopy, ERCP)    Subjective:   Discharge Exam: Vitals:   01/18/18 0540 01/18/18 1313  BP: 128/60 (!) 140/46  Pulse: 80  79  Resp:  16  Temp:  98.1 F (36.7 C)  SpO2: 97% 100%   Vitals:   01/18/18 0130 01/18/18 0503 01/18/18  0540 01/18/18 1313  BP: 135/86 (!) 107/27 128/60 (!) 140/46  Pulse: 89 80 80 79  Resp: 15 18  16   Temp: 98.6 F (37 C) 98.9 F (37.2 C)  98.1 F (36.7 C)  TempSrc: Oral   Oral  SpO2: 95% 100% 97% 100%  Weight: 81 kg (178 lb 9.2 oz)     Height:        General: Pt is alert, awake, not in acute distress Cardiovascular: Regular rate and rhythm, 2 out of 6 systolic murmur heard best throughout the precordium Respiratory: CTA bilaterally, no wheezing, no rhonchi Abdominal: Soft, NT, ND, bowel sounds + Extremities: Trace lower extremity edema, right upper extremity fistula with bruit and thrill    The results of significant diagnostics from this hospitalization (including imaging, microbiology, ancillary and laboratory) are listed below for reference.     Microbiology: Recent Results (from the past 240 hour(s))  Urine culture     Status: Abnormal   Collection Time: 01/13/18 12:45 AM  Result Value Ref Range Status   Specimen Description URINE, CATHETERIZED  Final   Special Requests NONE  Final   Culture (A)  Final    <10,000 COLONIES/mL INSIGNIFICANT GROWTH Performed at [facility], [address], [place], [place] [address]    Report Status 01/14/2018 FINAL  Final  MRSA PCR Screening     Status: None   Collection Time: 01/17/18  5:30 PM  Result Value Ref Range Status   MRSA by PCR NEGATIVE NEGATIVE Final    Comment:        The GeneXpert MRSA Assay (FDA approved for NASAL specimens only), is one component of a comprehensive MRSA colonization surveillance program. It is not intended to diagnose MRSA infection nor to guide or monitor treatment for MRSA infections. Performed at [facility], [place] [address], [place], [place] [address]      Labs: BNP (last 3 results) No results for input(s): BNP in the last 8760 hours. Basic Metabolic Panel: Recent  Labs  Lab 01/13/18 0045 01/17/18 0104 01/18/18 0603  NA 136 138 137  K 3.7 3.6 4.0  CL 94* 94* 95*  CO2 28 28 32  GLUCOSE 144* 104* 81  BUN 28* 23* 9  CREATININE 7.18* 7.05* 4.27*  CALCIUM 9.0 9.6 8.5*   Liver Function Tests: Recent Labs  Lab 01/13/18 0045 01/17/18 0104  AST 44* 34  ALT 20 18  ALKPHOS 117 127*  BILITOT 0.8 0.9  PROT 5.3* 5.7*  ALBUMIN 2.2* 2.4*   No results for input(s): LIPASE, AMYLASE in the last 168 hours. No results for input(s): AMMONIA in the last 168 hours. CBC: Recent Labs  Lab 01/13/18 0045 01/17/18 0104 01/18/18 0603  WBC 14.6* 11.0* 8.6  NEUTROABS 12.9* 7.9*  --   HGB 8.8* 9.2* 8.3*  HCT 25.9* 27.9* 25.0*  MCV 99.6 102.6* 101.2*  PLT 309 327 265   Cardiac Enzymes: Recent Labs  Lab 01/13/18 0045  TROPONINI <0.03   BNP: Invalid input(s): POCBNP CBG: Recent Labs  Lab 01/18/18 0501 01/18/18 0804 01/18/18 0943 01/18/18 1220 01/18/18 1417  GLUCAP 76 88 119* 100* 151*   D-Dimer No results for input(s): DDIMER in the last 72 hours. Hgb A1c Recent Labs    01/18/18 0603  HGBA1C 5.4   Lipid Profile No results for input(s): CHOL, HDL, LDLCALC, TRIG, CHOLHDL, LDLDIRECT in the last 72 hours. Thyroid function studies No results for input(s): TSH, T4TOTAL, T3FREE, THYROIDAB in the last 72 hours.  Invalid input(s): FREET3 Anemia work up No results for input(s): VITAMINB12, FOLATE, FERRITIN, TIBC, IRON, RETICCTPCT in the last 72 hours. Urinalysis    Component Value Date/Time   COLORURINE YELLOW 01/13/2018 0045   APPEARANCEUR CLOUDY (A) 01/13/2018 0045   LABSPEC 1.014 01/13/2018 0045   PHURINE 8.0 01/13/2018 0045   GLUCOSEU NEGATIVE 01/13/2018 0045   HGBUR SMALL (A) 01/13/2018 0045   BILIRUBINUR NEGATIVE 01/13/2018 0045   BILIRUBINUR neg 02/14/2015 1115   KETONESUR NEGATIVE 01/13/2018 0045   PROTEINUR 100 (A) 01/13/2018 0045   UROBILINOGEN 0.2 02/20/2015 0944   NITRITE NEGATIVE 01/13/2018 0045   LEUKOCYTESUR LARGE (A)  01/13/2018 0045   Sepsis Labs Invalid input(s): PROCALCITONIN,  WBC,  LACTICIDVEN Microbiology Recent Results (from the past 240 hour(s))  Urine culture     Status: Abnormal   Collection Time: 01/13/18 12:45 AM  Result Value Ref Range Status   Specimen Description URINE, CATHETERIZED  Final   Special Requests NONE  Final   Culture (A)  Final    <10,000 COLONIES/mL INSIGNIFICANT GROWTH Performed at [facility], [place] [address] [place], [place] [address]    Report Status 01/14/2018 FINAL  Final  MRSA PCR Screening     Status: None   Collection Time: 01/17/18  5:30 PM  Result Value Ref Range Status   MRSA by PCR NEGATIVE NEGATIVE Final    Comment:        The GeneXpert MRSA Assay (FDA approved for NASAL specimens only), is one component of a comprehensive MRSA colonization surveillance program. It is not intended to diagnose MRSA infection nor to guide or monitor treatment for MRSA infections. Performed at [facility], [place] [address] [place], [place] [address]      Time coordinating discharge: Over 30 minutes  SIGNED:   Cristy Folks, MD  [facility] 01/18/2018, 2:40 PM   If 7PM-7AM, please contact night-coverage [email or web address] Password [email or web address]

## 2018-01-18 NOTE — Progress Notes (Signed)
HD tx ended 1 hr 3 min early @ 0037 d/t pt c/o pain too great to stay on tx, pt kept hollaring out to stop the tx as I was about to end another tx so I stopped his tx and rinsed him back but delayed pulling his needles until after that was completed and explained this to pt and he agreed. UF goal not met, blood rinsed back, VSS, report called to Lauretta Grill, RN

## 2018-01-18 NOTE — Progress Notes (Signed)
HD tx initiated via 15Gx2 w/o problem, pull/push/flush well w/o problem, VSS, will cont to monitor while on HD tx 

## 2018-01-18 NOTE — Plan of Care (Signed)
  Problem: Health Behavior/Discharge Planning: Goal: Ability to manage health-related needs will improve Outcome: Progressing Patient and spouse acknowledges the potential need for additional behavioral medications long term to maintain steady behavior.

## 2018-01-18 NOTE — Progress Notes (Signed)
Patient discharged via wheelchair with wife. Iv access removed and compression dressing applied.  Discharge instructions regarding follow up appointments, medications and s/s of disease exacerbation reviewed with pt and pt's wife.  Patient questions answered, pt agreed and verbalized understanding.

## 2018-01-20 ENCOUNTER — Ambulatory Visit: Payer: Medicare Other | Admitting: Nurse Practitioner

## 2018-01-20 ENCOUNTER — Telehealth: Payer: Self-pay | Admitting: Endocrinology

## 2018-01-20 ENCOUNTER — Other Ambulatory Visit: Payer: Self-pay

## 2018-01-20 ENCOUNTER — Telehealth: Payer: Self-pay

## 2018-01-20 DIAGNOSIS — D509 Iron deficiency anemia, unspecified: Secondary | ICD-10-CM | POA: Diagnosis not present

## 2018-01-20 DIAGNOSIS — D631 Anemia in chronic kidney disease: Secondary | ICD-10-CM | POA: Diagnosis not present

## 2018-01-20 DIAGNOSIS — N186 End stage renal disease: Secondary | ICD-10-CM | POA: Diagnosis not present

## 2018-01-20 DIAGNOSIS — E1129 Type 2 diabetes mellitus with other diabetic kidney complication: Secondary | ICD-10-CM | POA: Diagnosis not present

## 2018-01-20 DIAGNOSIS — N2581 Secondary hyperparathyroidism of renal origin: Secondary | ICD-10-CM | POA: Diagnosis not present

## 2018-01-20 LAB — HEPATITIS B SURFACE ANTIGEN: Hepatitis B Surface Ag: NEGATIVE

## 2018-01-20 MED ORDER — LINAGLIPTIN 5 MG PO TABS: 5.0000 mg | ORAL_TABLET | Freq: Every day | ORAL | 0 refills | Status: DC

## 2018-01-20 NOTE — Telephone Encounter (Signed)
Someone called from [facility] to let us know they were going to go out to see [name] on 01/22/18

## 2018-01-20 NOTE — Telephone Encounter (Signed)
Called wife and spoke to her regarding MD instructions. Appointment was made to see MD on 01/21/18 at 10am for hospital discharge follow up. Wife was reminded to bring meter to this visit and to check blood sugars TID. Wife was also advised that MD sent new RX for Tranjenta 5mg  daily. Wife stated that she would pick up medication today and verbalized understanding of all instructions.

## 2018-01-20 NOTE — Telephone Encounter (Signed)
PA submitted for Trajenta 5mg  PO daily.

## 2018-01-20 NOTE — Telephone Encounter (Signed)
Patient was released from the hospital on Saturday and they took patient completely off of his insulin. They recommended the patient start on a pill Celesta Gentile). Insurance state they will not cover this but recommended Metformin   patients wife stated that they wanted her to contact our office to speak with the dr about starting this   Please advise

## 2018-01-20 NOTE — Telephone Encounter (Signed)
Please advise 

## 2018-01-20 NOTE — Telephone Encounter (Signed)
Will need to have him see me as soon as possible without labs He needs to check his sugar 3 times a day also bring his meter Please send prescription for Tradjenta 5 mg daily

## 2018-01-21 ENCOUNTER — Ambulatory Visit (INDEPENDENT_AMBULATORY_CARE_PROVIDER_SITE_OTHER): Payer: Medicare Other | Admitting: Endocrinology

## 2018-01-21 ENCOUNTER — Encounter: Payer: Self-pay | Admitting: Endocrinology

## 2018-01-21 ENCOUNTER — Encounter: Payer: Self-pay | Admitting: Nurse Practitioner

## 2018-01-21 ENCOUNTER — Other Ambulatory Visit: Payer: Self-pay

## 2018-01-21 VITALS — BP 120/60 | HR 74 | Ht 71.0 in | Wt 171.0 lb

## 2018-01-21 DIAGNOSIS — E1165 Type 2 diabetes mellitus with hyperglycemia: Secondary | ICD-10-CM | POA: Diagnosis not present

## 2018-01-21 DIAGNOSIS — Z794 Long term (current) use of insulin: Secondary | ICD-10-CM | POA: Diagnosis not present

## 2018-01-21 MED ORDER — TERIPARATIDE (RECOMBINANT) 600 MCG/2.4ML ~~LOC~~ SOLN: 20.0000 ug | Freq: Every day | SUBCUTANEOUS | 1 refills | Status: DC

## 2018-01-21 NOTE — Progress Notes (Signed)
Patient ID: [name], male   DOB: 07-18-1941, 77 y.o.   MRN: [id number]           Reason for Appointment:  Follow-up for Type 2 Diabetes  Referring physician: [place]  History of Present Illness:          Date of diagnosis of type 2 diabetes mellitus:  1996      Background history:  He has had long-standing diabetes probably treated with metformin initially and subsequently with sulfonylurea drugs At some point he was also given Actos in addition to his glipizide which he is still taking Records of his control are only available for the last 4 years Over the last few years his A1c has been generally in the 6-7% range His A1c was 6.2 in May 2016 and apparently was starting to get low normal blood sugars at times He was admitted to the hospital for fluid overload and renal failure and at that time his Actos was stopped Subsequently his blood sugars had been markedly increased He was switched from regular insulin to NovoLog on his initial consultation  Recent history:    His A1c is last  at 7.7 compared to 8.6 previously  INSULIN regimen is described as: None currently, previously on Lantus 13 units in am; 10--? 5--10  units Novolog with meals with syringe  Dialysis is done at 7 am, doing it on Monday/Wednesday/Fridays     Current blood sugar patterns and problems identified:  He was told to reduce his insulin dose on the last visit because of low normal blood sugars although he did not have any documented low sugars on his download a couple of weeks ago  He was admitted to the hospital on 4/25 with severe hypoglycemia with blood sugar down to 20 late at night  Since then he has not been on insulin and was supposed to start Januvia and Actos  He says he was not able to get Januvia covered by his insurance and has not started back on Actos  His wife is present today and she is helping him with his medication  Since coming home his blood sugars have been mostly near normal,  however did have a glucose of 46 the day after coming back even without any insulin  He has had only one high reading of 160 last night after supper  Fasting reading 76 today and 97 yesterday   Oral hypoglycemic drugs the patient is taking are: None Side effects from medications have been: None  Compliance with the medical regimen: Good   Glucose monitoring:  done 0-2  times a day         Glucometer:  Freestyle  Blood Glucose readings by review of monitor as above    Self-care: The diet that the patient has been following is: tries to limit sweets, portions. eating out only about once a week at a steak house        Typical meal intake: Breakfast is variable; supper 8 pm            Dietician visit, most recent: 08/2017               Exercise:  Not able to do any, currently in wheelchair  Weight history:  Wt Readings from Last 3 Encounters:  01/21/18 171 lb (77.6 kg)  01/18/18 178 lb 9.2 oz (81 kg)  01/12/18 179 lb (81.2 kg)    Glycemic control:    Lab Results  Component Value Date  HGBA1C 5.4 01/18/2018   HGBA1C 6.2 01/07/2018   HGBA1C 6.2 (H) 11/07/2017   Lab Results  Component Value Date   MICROALBUR 37.2 09/03/2016   LDLCALC 21 09/05/2017   CREATININE 4.27 (H) 01/18/2018    Lab Results  Component Value Date   FRUCTOSAMINE 454 (H) 07/12/2017   FRUCTOSAMINE 355 (H) 10/08/2016   FRUCTOSAMINE 407 (H) 08/29/2016   PROBLEM 2 Osteoporosis: See review of systems       Allergies as of 01/21/2018      Reactions   Penicillins Rash   Has patient had a PCN reaction causing immediate rash, facial/tongue/throat swelling, SOB or lightheadedness with hypotension: Yes Has patient had a PCN reaction causing severe rash involving mucus membranes or skin necrosis: Yes Has patient had a PCN reaction that required hospitalization: No Has patient had a PCN reaction occurring within the last 10 years: No If all of the above answers are "NO", then may proceed with  Cephalosporin use.      Medication List        Accurate as of 01/21/18 10:44 AM. Always use your most recent med list.          acetaminophen 500 MG tablet Commonly known as:  TYLENOL Take 1,000 mg by mouth 2 (two) times daily as needed (for pain).   acetaminophen-codeine 300-30 MG tablet Commonly known as:  TYLENOL #3 take 1 tablet by mouth every 4 hours if needed for pain   allopurinol 100 MG tablet Commonly known as:  ZYLOPRIM Take 100 mg by mouth daily as needed (pain).   aspirin 325 MG tablet Take 325 mg by mouth daily.   cephALEXin 500 MG capsule Commonly known as:  KEFLEX Take 1 capsule (500 mg total) by mouth 4 (four) times daily.   ciprofloxacin 500 MG tablet Commonly known as:  CIPRO Take 1 tablet (500 mg total) by mouth daily. On dialysis days take after dialysis   clobetasol 0.05 % external solution Commonly known as:  TEMOVATE apply to affected area ON THE SCALP UP TO 2 TIMES A DAY AS NEEDED...  (REFER TO PRESCRIPTION NOTES).   DIALYVITE/ZINC Tabs Take 1 tablet by mouth daily.   docusate sodium 100 MG capsule Commonly known as:  COLACE Take 1 capsule (100 mg total) by mouth daily. To avoid constipation from pain medication   donepezil 10 MG tablet Commonly known as:  ARICEPT Take 1 tablet (10 mg total) by mouth at bedtime.   Fluocinolone Acetonide Scalp 0.01 % Oil apply to affected area ON SCALP EVERY NIGHT AT BEDTIME, WASH UPON AWAKENING   Fluocinolone Acetonide Body 0.01 % Oil Apply 1 ml on the skin daily   fluticasone 0.05 % cream Commonly known as:  CUTIVATE apply to affected area ON FACE UP TO 2 TIMES A DAY AS NEEDED   fluticasone 50 MCG/ACT nasal spray Commonly known as:  FLONASE Place 1 spray into both nostrils daily.   FREESTYLE FREEDOM LITE w/Device Kit Use to check blood sugar 2 times per day dx code E11.65   freestyle lancets Use as instructed to check blood sugar 2 times per day dx code E11.65   glucose blood test  strip Commonly known as:  ONETOUCH VERIO Use to check blood sugar 2 times per day. Dx code E11.65   Insulin Pen Needle 30G X 8 MM Misc Commonly known as:  NOVOFINE Use to inject insulin 3 times daily   ketoconazole 2 % shampoo Commonly known as:  NIZORAL Apply 1 application topically 2 (  two) times a week.   levothyroxine 50 MCG tablet Commonly known as:  SYNTHROID, LEVOTHROID Take 1 tablet (50 mcg total) by mouth daily.   lidocaine-prilocaine cream Commonly known as:  EMLA Apply 1 application topically every Monday, Wednesday, and Friday. AS DIRECTED   linagliptin 5 MG Tabs tablet Commonly known as:  TRADJENTA Take 1 tablet (5 mg total) by mouth daily.   loratadine 10 MG tablet Commonly known as:  CLARITIN Take 10 mg by mouth daily as needed for allergies.   methocarbamol 500 MG tablet Commonly known as:  ROBAXIN Take 1 tablet (500 mg total) by mouth at bedtime as needed for muscle spasms.   metroNIDAZOLE 0.75 % gel Commonly known as:  METROGEL APPLY TO AFFECTED AREA ON SKIN  ON FACE TWICE DAILY   mupirocin ointment 2 % Commonly known as:  BACTROBAN Use as directed to affected areas   Naftifine HCl 2 % Crea Commonly known as:  NAFTIN Apply to feet daily for fungal infection   nystatin-triamcinolone cream Commonly known as:  MYCOLOG II Apply 1 application topically 2 (two) times daily.   omeprazole 40 MG capsule Commonly known as:  PRILOSEC Take 1 capsule (40 mg total) by mouth daily.   oxyCODONE 5 MG immediate release tablet Commonly known as:  ROXICODONE Take 1 tablet (5 mg total) by mouth every 4 (four) hours as needed for severe pain.   oxyCODONE-acetaminophen 5-325 MG tablet Commonly known as:  PERCOCET/ROXICET Take 1-2 tablets by mouth every 6 (six) hours as needed for severe pain.   pioglitazone 30 MG tablet Commonly known as:  ACTOS take 1 tablet by mouth once daily   PROBIOTIC PO Take 1 tablet by mouth daily.   promethazine 12.5 MG  tablet Commonly known as:  PHENERGAN Take 1 tablet (12.5 mg total) by mouth every 12 (twelve) hours as needed for nausea or vomiting.   RENVELA 800 MG tablet Generic drug:  sevelamer carbonate Take 800-1,600 mg by mouth See admin instructions. Take 1 tablet with breakfast and at lunch then take 2 tablets at dinner and 800 mg with each snack   SENSIPAR 30 MG tablet Generic drug:  cinacalcet Take 30 mg by mouth daily.   simvastatin 20 MG tablet Commonly known as:  ZOCOR Take 20 mg by mouth at bedtime.   sitaGLIPtin 25 MG tablet Commonly known as:  JANUVIA Take 1 tablet (25 mg total) by mouth daily.   Teriparatide (Recombinant) 600 MCG/2.4ML Soln Commonly known as:  FORTEO Inject 0.08 mLs (20 mcg total) into the skin daily.   traMADol 50 MG tablet Commonly known as:  ULTRAM Take 1 tablet (50 mg total) by mouth every 12 (twelve) hours as needed.   triamcinolone ointment 0.1 % Commonly known as:  KENALOG Apply 1 application topically 2 (two) times daily. Use between thighs   VITAMIN A PO Take 1 tablet by mouth daily.   VITAMIN C PO Take 1 tablet by mouth daily.       Allergies:  Allergies  Allergen Reactions  . Penicillins Rash    Has patient had a PCN reaction causing immediate rash, facial/tongue/throat swelling, SOB or lightheadedness with hypotension: Yes Has patient had a PCN reaction causing severe rash involving mucus membranes or skin necrosis: Yes Has patient had a PCN reaction that required hospitalization: No Has patient had a PCN reaction occurring within the last 10 years: No If all of the above answers are "NO", then may proceed with Cephalosporin use.     Past  Medical History:  Diagnosis Date  . Allergy   . Anemia   . Arthritis   . Cataract    bil cateracts removed  . Coronary artery disease   . Diabetes mellitus    Type 2  . Diverticulitis   . ED (erectile dysfunction)   . 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 ESRD (end stage renal disease) on  dialysis ([place]) 03/2015  . GERD (gastroesophageal reflux disease)    pepto   . Gout   . Hyperlipidemia   . Hypertension   . Hypothyroidism   . Pneumonia   . PVD (peripheral vascular disease) ([place])    has plastic aorta  . Renal insufficiency   . Seasonal allergies   . Shortness of breath dyspnea   . Sleep apnea    does not wear c-pap  . Thyroid disease     Past Surgical History:  Procedure Laterality Date  . aortobifemoral bypass    . AV FISTULA PLACEMENT Left 12/01/2013   Procedure: ARTERIOVENOUS (AV) FISTULA CREATION- LEFT BRACHIOCEPHALIC;  Surgeon: Angelia Mould, MD;  Location: [facility];  Service: Vascular;  Laterality: Left;  . [place] TRANSPOSITION Right 07/27/2014   Procedure: BASCILIC VEIN TRANSPOSITION;  Surgeon: Angelia Mould, MD;  Location: [facility];  Service: Vascular;  Laterality: Right;  . BREAST SURGERY     left - granulomatous mastitis  . COLONOSCOPY    . 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 EYE SURGERY Bilateral    cataracts  . HEMODIALYSIS INPATIENT  01/17/2018      . REVISON OF ARTERIOVENOUS FISTULA Left 02/09/2014   Procedure: REVISON OF LEFT ARTERIOVENOUS FISTULA - RESECTION OF RENDUNDANT VEIN;  Surgeon: Angelia Mould, MD;  Location: [facility];  Service: Vascular;  Laterality: Left;  . SBO with lysis adhesions    . SHUNTOGRAM Left 04/19/2014   Procedure: FISTULOGRAM;  Surgeon: Angelia Mould, MD;  [facility] C[facility] LAB;  Service: Cardiovascular;  Laterality: Left;  . UNILATERAL UPPER EXTREMEITY ANGIOGRAM N/A 07/12/2014   Procedure: UNILATERAL UPPER Anselmo Rod;  Surgeon: Angelia Mould, MD;  Location[facility] LAB;  Service: Cardiovascular;  Laterality: N/A;    Family History  Problem Relation Age of Onset  . Aneurysm Mother   . Heart disease Father   . Stroke Father   . Hypertension Father   . Diabetes Father   . Dementia Neg Hx   . Colon cancer Neg Hx   . Esophageal cancer Neg Hx   . Pancreatic  cancer Neg Hx   . Prostate cancer Neg Hx   . Rectal cancer Neg Hx   . Stomach cancer Neg Hx     Social History:  reports that he quit smoking about 23 years ago. He has never used smokeless tobacco. He reports that he does not drink alcohol or use drugs.    Review of Systems      OSTEOPOROSIS:  He has had vertebral fractures twice and still having residual back pain He is also tending to fall, today came in a wheelchair  His T score at the radius was -4.6 and unable to read his hips and spine on the bone densitometry  Currently not on any treatment and has been recommended Forteo but has not been able to get this through his insurance as yet   He is reportedly taking Sensipar for secondary hyperparathyroidism but no recent PTH level available  Lab Results  Component Value Date   PTH  154 (H) 10/06/2014   CALCIUM 8.5 (L) 01/18/2018   CAION 1.44 (H) 07/12/2014   PHOS 4.2 01/10/2016     Lipid history: He has been treated with Simvastatin 20 mg  by PCP, previously on Atorvastatin    Lab Results  Component Value Date   CHOL 79 09/05/2017   HDL 38.00 (L) 09/05/2017   LDLCALC 21 09/05/2017   TRIG 99.0 09/05/2017   CHOLHDL 2 09/05/2017          He has ESRD on dialysis    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  Mild hypothyroidism: On 50 mcg levothyroxine, no recent labs available  Lab Results  Component Value Date   TSH 1.92 07/12/2017    He is having significant early satiety and recent weight loss, awaiting PCP appointment   Physical Examination:  BP 120/60 (BP Location: Left Arm, Patient Position: Sitting, Cuff Size: Normal)   Pulse 74   Ht 5' 11"  (1.803 m)   Wt 171 lb (77.6 kg)   SpO2 96%   BMI 23.85 kg/m       ASSESSMENT:  Diabetes type 2   See history of present illness for detailed discussion of his current management, blood sugar patterns and problems identified  His blood sugars have been dropping even with  small doses of insulin especially in the last couple of weeks  Since his discharge from the hospital for hypoglycemia about 3 days ago his blood sugars have not been high without insulin, highest reading 160 after supper last night Currently not on any medications since he has not picked up his prescription for Tradjenta Previously prescribed Januvia which apparently was not covered   OSTEOPOROSIS with multiple vertebral fractures and T score of -4.6: Awaiting treatment with Forteo   PLAN:  He we will start [place] which should help regulate his blood sugars without tendency to hypoglycemia Currently since he has lost a lot of weight he likely needs insulin in the near future He will discuss his weight loss and early satiety with PCP later this week Continue to check blood sugars closely at home and follow-up in about a month  There are no Patient Instructions on file for this visit.        Elayne Snare 01/21/2018, 10:44 AM   Note: This office note was prepared with Dragon voice recognition system technology. Any transcriptional errors that result from this process are unintentional.

## 2018-01-21 NOTE — Telephone Encounter (Signed)
[facility] called again and stated that they spoke with insurance and insurance stated to [facility] that no PA had been received for the medication Forteo. Pharmacy asked staff to call insurance at [phone] and fax number is [phone]

## 2018-01-21 NOTE — Telephone Encounter (Signed)
Called [employer] back with answers from questionare that MD filled out. After providing answers, patient was approved for Forteo Injection. Case ID: [id number] Approved from 12/22/2017-01/21/2020. Patient was called and notified. PT verbalized understanding.

## 2018-01-21 NOTE — Telephone Encounter (Signed)
Called insurance at number listed below. They are faxing forms for MD to fill out, and they instructed to call or fax the forms back in order to obtain prior authorization of Forteo.

## 2018-01-21 NOTE — Patient Instructions (Signed)
No Actos  Forteo inject once daily

## 2018-01-22 ENCOUNTER — Telehealth: Payer: Self-pay | Admitting: Endocrinology

## 2018-01-22 ENCOUNTER — Other Ambulatory Visit: Payer: Self-pay

## 2018-01-22 DIAGNOSIS — E1129 Type 2 diabetes mellitus with other diabetic kidney complication: Secondary | ICD-10-CM | POA: Diagnosis not present

## 2018-01-22 DIAGNOSIS — E1122 Type 2 diabetes mellitus with diabetic chronic kidney disease: Secondary | ICD-10-CM | POA: Diagnosis not present

## 2018-01-22 DIAGNOSIS — D631 Anemia in chronic kidney disease: Secondary | ICD-10-CM | POA: Diagnosis not present

## 2018-01-22 DIAGNOSIS — N2581 Secondary hyperparathyroidism of renal origin: Secondary | ICD-10-CM | POA: Diagnosis not present

## 2018-01-22 DIAGNOSIS — E119 Type 2 diabetes mellitus without complications: Secondary | ICD-10-CM | POA: Diagnosis not present

## 2018-01-22 DIAGNOSIS — Z992 Dependence on renal dialysis: Secondary | ICD-10-CM | POA: Diagnosis not present

## 2018-01-22 DIAGNOSIS — N186 End stage renal disease: Secondary | ICD-10-CM | POA: Diagnosis not present

## 2018-01-22 MED ORDER — GLUCOSE BLOOD VI STRP: ORAL_STRIP | 12 refills | Status: DC

## 2018-01-22 NOTE — Telephone Encounter (Signed)
Medication was refilled and sent to pharmacy. 

## 2018-01-22 NOTE — Telephone Encounter (Signed)
Patient needs a refill sent in for the Freestyle Lite test strips. She states they pharmacy doesn't always send the refill request when asked so she called [place] to request this be refilled.    [facility] [address], [address] AT [address]

## 2018-01-23 ENCOUNTER — Telehealth: Payer: Self-pay | Admitting: Internal Medicine

## 2018-01-23 ENCOUNTER — Encounter (HOSPITAL_COMMUNITY): Payer: Self-pay

## 2018-01-23 ENCOUNTER — Emergency Department (HOSPITAL_COMMUNITY): Payer: Medicare Other

## 2018-01-23 ENCOUNTER — Ambulatory Visit: Payer: Medicare Other | Admitting: Internal Medicine

## 2018-01-23 ENCOUNTER — Emergency Department (HOSPITAL_COMMUNITY)
Admission: EM | Admit: 2018-01-23 | Discharge: 2018-01-23 | Disposition: A | Discharge status: Home / Self Care | Payer: Medicare Other | Attending: Emergency Medicine | Admitting: Emergency Medicine

## 2018-01-23 ENCOUNTER — Telehealth: Payer: Self-pay

## 2018-01-23 DIAGNOSIS — Z87891 Personal history of nicotine dependence: Secondary | ICD-10-CM | POA: Diagnosis not present

## 2018-01-23 DIAGNOSIS — Z7982 Long term (current) use of aspirin: Secondary | ICD-10-CM | POA: Insufficient documentation

## 2018-01-23 DIAGNOSIS — G8929 Other chronic pain: Secondary | ICD-10-CM | POA: Diagnosis not present

## 2018-01-23 DIAGNOSIS — E1122 Type 2 diabetes mellitus with diabetic chronic kidney disease: Secondary | ICD-10-CM | POA: Diagnosis not present

## 2018-01-23 DIAGNOSIS — E039 Hypothyroidism, unspecified: Secondary | ICD-10-CM | POA: Insufficient documentation

## 2018-01-23 DIAGNOSIS — N186 End stage renal disease: Secondary | ICD-10-CM | POA: Diagnosis not present

## 2018-01-23 DIAGNOSIS — I120 Hypertensive chronic kidney disease with stage 5 chronic kidney disease or end stage renal disease: Secondary | ICD-10-CM | POA: Insufficient documentation

## 2018-01-23 DIAGNOSIS — Z992 Dependence on renal dialysis: Secondary | ICD-10-CM | POA: Diagnosis not present

## 2018-01-23 DIAGNOSIS — R109 Unspecified abdominal pain: Secondary | ICD-10-CM | POA: Diagnosis not present

## 2018-01-23 DIAGNOSIS — R200 Anesthesia of skin: Secondary | ICD-10-CM | POA: Diagnosis not present

## 2018-01-23 DIAGNOSIS — R1030 Lower abdominal pain, unspecified: Secondary | ICD-10-CM | POA: Insufficient documentation

## 2018-01-23 DIAGNOSIS — R0602 Shortness of breath: Secondary | ICD-10-CM | POA: Diagnosis not present

## 2018-01-23 DIAGNOSIS — I2510 Atherosclerotic heart disease of native coronary artery without angina pectoris: Secondary | ICD-10-CM | POA: Diagnosis not present

## 2018-01-23 DIAGNOSIS — Z79899 Other long term (current) drug therapy: Secondary | ICD-10-CM | POA: Insufficient documentation

## 2018-01-23 DIAGNOSIS — R197 Diarrhea, unspecified: Secondary | ICD-10-CM | POA: Insufficient documentation

## 2018-01-23 DIAGNOSIS — Z7984 Long term (current) use of oral hypoglycemic drugs: Secondary | ICD-10-CM | POA: Diagnosis not present

## 2018-01-23 LAB — CBC
HCT: 29.2 % — ABNORMAL LOW (ref 39.0–52.0)
Hemoglobin: 9.7 g/dL — ABNORMAL LOW (ref 13.0–17.0)
MCH: 34.4 pg — ABNORMAL HIGH (ref 26.0–34.0)
MCHC: 33.2 g/dL (ref 30.0–36.0)
MCV: 103.5 fL — ABNORMAL HIGH (ref 78.0–100.0)
Platelets: 263 10*3/uL (ref 150–400)
RBC: 2.82 MIL/uL — ABNORMAL LOW (ref 4.22–5.81)
RDW: 20.5 % — ABNORMAL HIGH (ref 11.5–15.5)
WBC: 9.4 10*3/uL (ref 4.0–10.5)

## 2018-01-23 LAB — COMPREHENSIVE METABOLIC PANEL
ALT: 14 U/L — ABNORMAL LOW (ref 17–63)
AST: 49 U/L — ABNORMAL HIGH (ref 15–41)
Albumin: 2.5 g/dL — ABNORMAL LOW (ref 3.5–5.0)
Alkaline Phosphatase: 129 U/L — ABNORMAL HIGH (ref 38–126)
Anion gap: 13 (ref 5–15)
BUN: 12 mg/dL (ref 6–20)
CO2: 31 mmol/L (ref 22–32)
Calcium: 8.8 mg/dL — ABNORMAL LOW (ref 8.9–10.3)
Chloride: 96 mmol/L — ABNORMAL LOW (ref 101–111)
Creatinine, Ser: 4.48 mg/dL — ABNORMAL HIGH (ref 0.61–1.24)
GFR calc Af Amer: 13 mL/min — ABNORMAL LOW (ref >60)
GFR calc non Af Amer: 12 mL/min — ABNORMAL LOW (ref >60)
Glucose, Bld: 107 mg/dL — ABNORMAL HIGH (ref 65–99)
Potassium: 4.1 mmol/L (ref 3.5–5.1)
Sodium: 140 mmol/L (ref 135–145)
Total Bilirubin: 0.8 mg/dL (ref 0.3–1.2)
Total Protein: 5.6 g/dL — ABNORMAL LOW (ref 6.5–8.1)

## 2018-01-23 LAB — LIPASE, BLOOD: Lipase: 42 U/L (ref 11–51)

## 2018-01-23 LAB — URINALYSIS, ROUTINE W REFLEX MICROSCOPIC
Bilirubin Urine: NEGATIVE
Glucose, UA: NEGATIVE mg/dL
Hgb urine dipstick: NEGATIVE
Ketones, ur: NEGATIVE mg/dL
Nitrite: NEGATIVE
Protein, ur: 100 mg/dL — AB
Specific Gravity, Urine: 1.015 (ref 1.005–1.030)
pH: 9 — ABNORMAL HIGH (ref 5.0–8.0)

## 2018-01-23 MED ORDER — IOHEXOL 300 MG/ML  SOLN
100.0000 mL | Freq: Once | INTRAMUSCULAR | Status: AC | PRN
  Administered 2018-01-23: 100 mL via INTRAVENOUS

## 2018-01-23 MED ORDER — ONDANSETRON 4 MG PO TBDP: 4.0000 mg | ORAL_TABLET | Freq: Three times a day (TID) | ORAL | 2 refills | Status: DC | PRN

## 2018-01-23 MED ORDER — ONDANSETRON HCL 4 MG/2ML IJ SOLN
4.0000 mg | Freq: Once | INTRAMUSCULAR | Status: AC
  Administered 2018-01-23: 4 mg via INTRAVENOUS
  Filled 2018-01-23: qty 2

## 2018-01-23 MED ORDER — SODIUM CHLORIDE 0.9 % IV SOLN: INTRAVENOUS | Status: DC

## 2018-01-23 NOTE — Telephone Encounter (Signed)
Spoke with patients wife and informed her of Dr. Ronnie Derby message. Pt verbalized understanding.

## 2018-01-23 NOTE — ED Notes (Signed)
Pt advising he does not make much urine. May have to be cathed

## 2018-01-23 NOTE — Discharge Instructions (Addendum)
I recommend close follow-up back with your primary care doctor.  Repeat CT scan here today with contrast without any acute findings.  Continue your dialysis as scheduled for tomorrow.  Labs without significant abnormalities.  Urinalysis questionable UTI but sent for culture because not confirmatory.  If culture positive you will be contacted.  Take the Zofran as needed for nausea.

## 2018-01-23 NOTE — ED Notes (Signed)
PT states understanding of care given, follow up care, and medication prescribed. PT ambulated from ED to car with a steady gait. 

## 2018-01-23 NOTE — Telephone Encounter (Signed)
[name] called back to say he has gone to the ED and has been waiting for several hours and they have not done anything, he wanted to talk to Dr Renold Genta and see if she could get him admitted to hospital without continuing to wait in the ED. I let him know that she could not have him admitted that he needs to stay there and see what they say.

## 2018-01-23 NOTE — ED Triage Notes (Signed)
Pt BIB ems for multiple complaints, L arm numbness for 1 week, SOB that started yesterday during dialysis, constipation, diarrhea and abd pain. Pt speaking in full sentences, midway through triaging the pt he had to rush to the bathroom to have diarrhea.   EMS vitals: 128/78 [id number] 100% on room air.

## 2018-01-23 NOTE — ED Provider Notes (Signed)
[facility] EMERGENCY DEPARTMENT Provider Note   CSN: [id number] Arrival date & time: 01/23/18  1146     History   Chief Complaint Chief Complaint  Patient presents with  . Multiple complaints    HPI [name] is a 77 y.o. male.  Patient brought in by EMS.  For multiple complaints.  They reported left arm weakness for a week.  Shortness of breath started yesterday during dialysis with constipation and diarrhea abdominal pain.  Patient states that he is normally dialyzed Monday Wednesdays and Fridays.  He felt so bad yesterday they could not complete dialysis his main complaint to me is abdominal pain mostly lower abdomen some diarrhea.  Discussion with his wife he has had most of the symptoms for 6 months.  Also decreased appetite.  Denied any neurofocal deficits.  AV fistula is in his right upper arm with good thrill.  Patient work-up for the symptoms have included a CT abdomen pelvis without contrast in January upper endoscopy and colonoscopy done by gastroenterology.  Dr. Renold Genta is the patient's main primary care provider.  Review of notes extensively several things and concern including to urinary tract infections although patient does not make any urine.  Has been receiving dialysis for this patient is 3 years.  Currently not on antibiotics but both courses treated with antibiotics but nothing specifically addressing the weight loss decreased appetite the son now chronic abdominal pain.  Patient does not understand why he has been feeling like this for so long and wants some answers.  Patient was admitted February 26-27 for hypoglycemia.  Review of that admission did not address any other abdominal complaints at all.     Past Medical History:  Diagnosis Date  . Allergy   . Anemia   . Arthritis   . Cataract    bil cateracts removed  . Coronary artery disease   . Diabetes mellitus    Type 2  . Diverticulitis   . ED (erectile dysfunction)   . Elevated  homocysteine ([place])   . ESRD (end stage renal disease) on dialysis ([place]) 03/2015  . GERD (gastroesophageal reflux disease)    pepto   . Gout   . Hyperlipidemia   . Hypertension   . Hypothyroidism   . Pneumonia   . PVD (peripheral vascular disease) ([place])    has plastic aorta  . Renal insufficiency   . Seasonal allergies   . Shortness of breath dyspnea   . Sleep apnea    does not wear c-pap  . Thyroid disease     Patient Active Problem List   Diagnosis Date Noted  . Low back pain 01/17/2018  . Anorexia   . Dyspnea   . Macrocytic anemia 06/04/2016  . Thrombocytopenia ([place]) 06/04/2016  . Exertional dyspnea 06/04/2016  . Arm paresthesia, left 06/04/2016  . Dyspnea on exertion 06/04/2016  . Tenderness of right calf 06/04/2016  . Mild cognitive impairment with memory loss 01/12/2016  . ESRD on dialysis ([place]) 05/17/2015  . Hypoglycemia   . Weakness 02/18/2015  . OSA (obstructive sleep apnea) 02/02/2015  . Hyperkalemia 10/05/2014  . Chronic kidney disease (CKD), stage IV (severe) ([place]) 04/07/2014  . End stage renal disease ([place]) 11/18/2013  . BPH (benign prostatic hyperplasia) 11/22/2012  . Peripheral vascular disease ([place]) 12/24/2011  . Hyperparathyroidism ([place]) 12/24/2011  . Hypothyroidism 12/24/2011  . Allergic rhinitis 12/24/2011  . Erectile dysfunction 12/24/2011  . Insulin dependent diabetes mellitus ([place]) 09/03/2008  . Hyperlipidemia 09/03/2008  . Essential  hypertension 08/30/2008  . PANCREATITIS, HX OF 08/30/2008  . RENAL FAILURE, ACUTE, HX OF 08/30/2008  . DIVERTICULOSIS, COLON 06/08/2003    Past Surgical History:  Procedure Laterality Date  . aortobifemoral bypass    . AV FISTULA PLACEMENT Left 12/01/2013   Procedure: ARTERIOVENOUS (AV) FISTULA CREATION- LEFT BRACHIOCEPHALIC;  Surgeon: Angelia Mould, MD;  Location: [facility];  Service: Vascular;  Laterality: Left;  . [place] TRANSPOSITION Right 07/27/2014   Procedure: BASCILIC VEIN TRANSPOSITION;   Surgeon: Angelia Mould, MD;  Location: [facility];  Service: Vascular;  Laterality: Right;  . BREAST SURGERY     left - granulomatous mastitis  . COLONOSCOPY    . 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 EYE SURGERY Bilateral    cataracts  . HEMODIALYSIS INPATIENT  01/17/2018      . REVISON OF ARTERIOVENOUS FISTULA Left 02/09/2014   Procedure: REVISON OF LEFT ARTERIOVENOUS FISTULA - RESECTION OF RENDUNDANT VEIN;  Surgeon: Angelia Mould, MD;  Location: [facility];  Service: Vascular;  Laterality: Left;  . SBO with lysis adhesions    . SHUNTOGRAM Left 04/19/2014   Procedure: FISTULOGRAM;  Surgeon: Angelia Mould, MD;  Location: [facility] C[facility] LAB;  Service: Cardiovascular;  Laterality: Left;  . UNILATERAL UPPER EXTREMEITY ANGIOGRAM N/A 07/12/2014   Procedure: UNILATERAL UPPER Anselmo Rod;  Surgeon: Angelia Mould, MD;  Location: [facility] CATH LAB;  Service: Cardiovascular;  Laterality: N/A;        Home Medications    Prior to Admission medications   Medication Sig Start Date End Date Taking? Authorizing Provider  acetaminophen (TYLENOL) 500 MG tablet Take 1,000 mg by mouth 2 (two) times daily as needed (for pain).    Yes [provider]  allopurinol (ZYLOPRIM) 100 MG tablet Take 100 mg by mouth daily as needed (pain).    Yes [provider]  aspirin 325 MG tablet Take 325 mg by mouth daily.    Yes [provider]  clobetasol (TEMOVATE) 0.05 % external solution apply to affected area ON THE SCALP UP TO 2 TIMES A DAY AS NEEDED...  (REFER TO PRESCRIPTION NOTES). 04/19/15  Yes [provider]  docusate sodium (COLACE) 100 MG capsule Take 1 capsule (100 mg total) by mouth daily. To avoid constipation from pain medication 01/02/18  Yes Tanna Furry, MD  donepezil (ARICEPT) 10 MG tablet Take 1 tablet (10 mg total) by mouth at bedtime. 01/12/16  Yes Melvenia Beam, MD  Fluocinolone Acetonide Body 0.01 % OIL Apply 1 ml on the skin daily  12/26/17  Yes [provider]  [place] 0.01 % OIL apply to affected area ON SCALP EVERY NIGHT AT BEDTIME, [place] UPON AWAKENING 04/28/15  Yes [provider]  fluticasone (CUTIVATE) 0.05 % cream apply to affected area ON FACE UP TO 2 TIMES A DAY AS NEEDED 04/19/15  Yes [provider]  fluticasone (FLONASE) 50 MCG/ACT nasal spray Place 1 spray into both nostrils daily. Patient taking differently: Place 1 spray into both nostrils daily as needed for allergies.  10/08/14  Yes Mikhail, Velta Addison, DO  ketoconazole (NIZORAL) 2 % shampoo Apply 1 application topically 2 (two) times a week. 03/31/15  Yes Baxley, Cresenciano Lick, MD  levothyroxine (SYNTHROID, LEVOTHROID) 50 MCG tablet Take 1 tablet (50 mcg total) by mouth daily. 08/06/14  Yes Elby Showers, MD  lidocaine-prilocaine (EMLA) cream Apply 1 application topically every Monday, Wednesday, and Friday. AS DIRECTED 03/29/15  Yes [provider]  linagliptin (TRADJENTA) 5 MG TABS tablet Take 1 tablet (5 mg total) by mouth daily. 01/20/18  Yes Elayne Snare, MD  loratadine (CLARITIN) 10 MG tablet Take 10 mg by mouth daily as needed for allergies.    Yes [provider]  methocarbamol (ROBAXIN) 500 MG tablet Take 1 tablet (500 mg total) by mouth at bedtime as needed for muscle spasms. 01/02/18  Yes Tanna Furry, MD  metroNIDAZOLE (METROGEL) 0.75 % gel APPLY TO AFFECTED AREA ON SKIN  ON FACE TWICE DAILY 06/14/15  Yes [provider]  mupirocin ointment (BACTROBAN) 2 % Use as directed to affected areas Patient taking differently: Apply 1 application topically 3 (three) times daily as needed (to affected areas).  09/04/16  Yes Baxley, Cresenciano Lick, MD  Naftifine HCl (NAFTIN) 2 % CREA Apply to feet daily for fungal infection Patient taking differently: Apply topically daily as needed (for fungal infection). To feet 09/12/15  Yes Baxley, Cresenciano Lick, MD  nystatin-triamcinolone [facility]) cream Apply 1 application topically  2 (two) times daily. 09/26/17  Yes Baxley, Cresenciano Lick, MD  omeprazole (PRILOSEC) 40 MG capsule Take 1 capsule (40 mg total) by mouth daily. 10/17/17  Yes Esterwood, Amy S, PA-C  Probiotic Product (PROBIOTIC PO) Take 1 tablet by mouth daily.   Yes [provider]  RENVELA 800 MG tablet Take 800-1,600 mg by mouth See admin instructions. Take 1 tablet with breakfast and at lunch then take 2 tablets at dinner and 800 mg with each snack 07/27/14  Yes [provider]  simvastatin (ZOCOR) 20 MG tablet Take 20 mg by mouth at bedtime.    Yes [provider]  triamcinolone ointment (KENALOG) 0.1 % Apply 1 application topically 2 (two) times daily. Use between thighs   Yes [provider]  Blood Glucose Monitoring Suppl (FREESTYLE FREEDOM LITE) w/Device KIT Use to check blood sugar 2 times per day dx code E11.65 03/08/16   Elayne Snare, MD  cephALEXin (KEFLEX) 500 MG capsule Take 1 capsule (500 mg total) by mouth 4 (four) times daily. Patient not taking: Reported on 01/23/2018 01/13/18   Charlann Lange, PA-C  glucose blood (FREESTYLE LITE) test strip Use as instructed 01/22/18   Elayne Snare, MD  glucose blood (ONETOUCH VERIO) test strip Use to check blood sugar 2 times per day. Dx code E11.65 03/01/16   Elayne Snare, MD  Insulin Pen Needle (NOVOFINE) 30G X 8 MM MISC Use to inject insulin 3 times daily 04/03/17   Elayne Snare, MD  Lancets (FREESTYLE) lancets Use as instructed to check blood sugar 2 times per day dx code E11.65 02/22/16   Elayne Snare, MD  ondansetron (ZOFRAN ODT) 4 MG disintegrating tablet Take 1 tablet (4 mg total) by mouth every 8 (eight) hours as needed for nausea or vomiting. 01/23/18   Fredia Sorrow, MD  pioglitazone (ACTOS) 30 MG tablet take 1 tablet by mouth once daily Patient not taking: Reported on 01/23/2018 05/30/17   Elayne Snare, MD  Teriparatide, Recombinant, (FORTEO) 600 MCG/2.4ML SOLN Inject 0.08 mLs (20 mcg total) into the skin daily. Patient not taking: Reported on  01/23/2018 01/21/18   Elayne Snare, MD    Family History Family History  Problem Relation Age of Onset  . Aneurysm Mother   . Heart disease Father   . Stroke Father   . Hypertension Father   . Diabetes Father   . Dementia Neg Hx   . Colon cancer Neg Hx   . Esophageal cancer Neg Hx   .  Pancreatic cancer Neg Hx   . Prostate cancer Neg Hx   . Rectal cancer Neg Hx   . Stomach cancer Neg Hx     Social History Social History   Tobacco Use  . Smoking status: Former Smoker    Last attempt to quit: 09/24/1994    Years since quitting: 23.3  . Smokeless tobacco: Never Used  Substance Use Topics  . Alcohol use: No    Alcohol/week: 0.0 oz    Comment: Quit Oct. 1977 ("somewhat heavy")  . Drug use: No     Allergies   Penicillins   Review of Systems Review of Systems  Constitutional: Negative for fever.  HENT: Negative for congestion.   Eyes: Negative for redness.  Respiratory: Negative for shortness of breath.   Cardiovascular: Negative for chest pain.  Gastrointestinal: Positive for abdominal pain, diarrhea and nausea. Negative for vomiting.  Genitourinary: Negative for dysuria.  Musculoskeletal: Negative for back pain.  Skin: Negative for rash.  Neurological: Negative for syncope and headaches.  Hematological: Does not bruise/bleed easily.  Psychiatric/Behavioral: Negative for confusion.     Physical Exam Updated Vital Signs BP (!) 134/113   Pulse 82   Temp 98.6 F (37 C) (Oral)   Resp 17   SpO2 99%   Physical Exam  Constitutional: He is oriented to person, place, and time. He appears well-developed and well-nourished. No distress.  HENT:  Head: Normocephalic and atraumatic.  Mouth/Throat: Oropharynx is clear and moist.  Eyes: Pupils are equal, round, and reactive to light. Conjunctivae and EOM are normal.  Neck: Normal range of motion. Neck supple.  Cardiovascular: Normal rate, regular rhythm and normal heart sounds.  Pulmonary/Chest: Effort normal and breath  sounds normal. No respiratory distress.  Abdominal: Soft. Bowel sounds are normal. He exhibits no distension. There is no tenderness.  Musculoskeletal: Normal range of motion.  Fistula right upper arm.  Good thrill.  Neurological: He is alert and oriented to person, place, and time. No cranial nerve deficit or sensory deficit. He exhibits normal muscle tone. Coordination normal.  Skin: Skin is warm.  Nursing note and vitals reviewed.    ED Treatments / Results  Labs (all labs ordered are listed, but only abnormal results are displayed) Labs Reviewed  COMPREHENSIVE METABOLIC PANEL - Abnormal; Notable for the following components:      Result Value   Chloride 96 (*)    Glucose, Bld 107 (*)    Creatinine, Ser 4.48 (*)    Calcium 8.8 (*)    Total Protein 5.6 (*)    Albumin 2.5 (*)    AST 49 (*)    ALT 14 (*)    Alkaline Phosphatase 129 (*)    GFR calc non Af Amer 12 (*)    GFR calc Af Amer 13 (*)    All other components within normal limits  CBC - Abnormal; Notable for the following components:   RBC 2.82 (*)    Hemoglobin 9.7 (*)    HCT 29.2 (*)    MCV 103.5 (*)    MCH 34.4 (*)    RDW 20.5 (*)    All other components within normal limits  URINALYSIS, ROUTINE W REFLEX MICROSCOPIC - Abnormal; Notable for the following components:   APPearance HAZY (*)    pH 9.0 (*)    Protein, ur 100 (*)    Leukocytes, UA TRACE (*)    Bacteria, UA RARE (*)    All other components within normal limits  URINE CULTURE  LIPASE,  BLOOD    EKG EKG Interpretation  Date/Time:  Thursday Jan 23 2018 12:01:42 EDT Ventricular Rate:  81 PR Interval:  156 QRS Duration: 68 QT Interval:  396 QTC Calculation: 460 R Axis:   -33 Text Interpretation:  Normal sinus rhythm Left axis deviation Low voltage QRS Cannot rule out Anterior infarct , age undetermined Abnormal ECG No significant change since last tracing Confirmed by Fredia Sorrow [phone]) on 01/23/2018 4:57:12 PM   Radiology Ct Abdomen  Pelvis W Contrast  Result Date: 01/23/2018 CLINICAL DATA:  77 year old male with constipation, diarrhea and abdominal pain EXAM: CT ABDOMEN AND PELVIS WITH CONTRAST TECHNIQUE: Multidetector CT imaging of the abdomen and pelvis was performed using the standard protocol following bolus administration of intravenous contrast. CONTRAST:  154m OMNIPAQUE IOHEXOL 300 MG/ML  SOLN COMPARISON:  Prior CT scan of the abdomen and pelvis 10/03/2017 FINDINGS: Lower chest: The lung bases are clear. Visualized cardiac structures are within normal limits for size. No pericardial effusion. Unremarkable visualized distal thoracic esophagus. Calcifications noted at the aortic root. Calcifications also present along the left anterior descending coronary artery. Hepatobiliary: Small circumscribed low-attenuation hepatic lesions are too small to characterize but unchanged across multiple prior studies consistent with small benign cysts or biliary hamartomas. No enhancing hepatic lesion. Gallbladder is unremarkable. No intra or extrahepatic biliary ductal dilatation. Pancreas: Unremarkable. No pancreatic ductal dilatation or surrounding inflammatory changes. Spleen: Normal in size without focal abnormality. Adrenals/Urinary Tract: The adrenal glands are normal. No enhancing renal mass, hydronephrosis or nephrolithiasis. Significant renal atrophy bilaterally. Multiple circumscribed low-attenuation cystic lesions consistent with acquired cystic disease of dialysis. Stomach/Bowel: Colonic diverticular disease without CT evidence of active inflammation. No evidence of obstruction or focal bowel wall thickening. Normal appendix in the right lower quadrant. The terminal ileum is unremarkable. Vascular/Lymphatic: Atherosclerotic calcifications throughout the abdominal aorta. Calcified plaque is present at the origin of the celiac artery and superior mesenteric artery. The degree of underlying stenosis is difficult to excess on this non arterial  phase study. Status post aorto bi femoral bypass. Reproductive: Prostate is unremarkable. Other: No abdominal wall hernia or abnormality. No abdominopelvic ascites. Musculoskeletal: New L1 and L2 compression fractures compared to October 03, 2017. These were seen on more recent prior lumbar spine imaging from April of 2019. No significant interval progression. Stable L5 compression fracture. Multilevel degenerative disc disease. Chronic L3 compression fracture status post cement augmentation. IMPRESSION: 1. No acute abnormality within the abdomen or pelvis. 2. Extensive atherosclerotic vascular calcifications including coronary artery and aortic. 3. Renal atrophy bilaterally consistent with known end-stage renal disease. Acquired renal cystic disease of hemodialysis. 4. Colonic diverticular disease without CT evidence of active inflammation. 5. Surgical changes of prior aortobifemoral bypass. 6. Stable L1, L2, L3 and L5 compression fractures. Electronically Signed   By: HJacqulynn CadetM.D.   On: 01/23/2018 20:56    Procedures Procedures (including critical care time)  Medications Ordered in ED Medications  0.9 %  sodium chloride infusion (has no administration in time range)  ondansetron (ZOFRAN) injection 4 mg (has no administration in time range)  iohexol (OMNIPAQUE) 300 MG/ML solution 100 mL (100 mLs Intravenous Contrast Given 01/23/18 2009)     Initial Impression / Assessment and Plan / ED Course  I have reviewed the triage vital signs and the nursing notes.  Pertinent labs & imaging results that were available during my care of the patient were reviewed by me and considered in my medical decision making (see chart for details).    Patient  listen to very carefully.  Records reviewed.  Labs without significant abnormalities consistent with an end-stage renal dialysis patient.  Sats fine no rales no obvious shortness of breath.  CT scan abdomen pelvis was done to evaluate his main concern  which is a chronic abdominal pain.  Again no acute findings perhaps just with contrast to be a little more accurate.  Recommend that patient follow closely back with primary care provider to work out a game plan may be they want to do some gastric emptying studies patient treated with Zofran for the nausea he is due for dialysis tomorrow.   Final Clinical Impressions(s) / ED Diagnoses   Final diagnoses:  Chronic abdominal pain  End stage renal failure on dialysis [facility])    ED Discharge Orders        Ordered    ondansetron (ZOFRAN ODT) 4 MG disintegrating tablet  Every 8 hours PRN     01/23/18 2147       Fredia Sorrow, MD 01/23/18 2321

## 2018-01-23 NOTE — Telephone Encounter (Signed)
Attempted to call patient's wife and inform her that Dr. Dwyane Dee states that patient's nephrologist does not want him to take Forteo. Patient's wife did not answer and voicemail was left, with no details, asking her to call staff back.

## 2018-01-23 NOTE — Telephone Encounter (Signed)
Patty Lopezgarcia Spouse [phone]  [name] called to say that Rush Landmark was too sick to come to doctors office at his appointment time, then she put Rush Landmark on the phone.  Rush Landmark stated that he was too sick to come to office, he said he thought he might be having a stroke, I ask him what was his symptoms, he said that he woke around 8 am and his left arm was numb and tingling, he had some tingling last night as well, he also has diarrhea real bad, it is where he gets some on him before he can get to bathroom, stomach is hurting, back is hurting and he is itching all over. I told him I would speak with Dr Renold Genta and let him know if he should keep appointment or go to Emergency room. He stated for me not to wait to long to call him back because he might die, I then let him know he may need to go on and go to the ED and not wait on a phone call back. He said he could wait around 10 minutes.

## 2018-01-23 NOTE — ED Notes (Signed)
Pt given urine cup and asked to give sample. Pt states he does not make urine due to dialysis.

## 2018-01-23 NOTE — Telephone Encounter (Signed)
Spoke with Dr. Renold Genta about patient symptoms and she advised for patient to go back to Ed for evaluation and cancelled today's appt with her. Called patient and notified him of Dr. Verlene Mayer instructions, pt verbalized understanding and he agreed to go to the ED.

## 2018-01-24 ENCOUNTER — Telehealth: Payer: Self-pay | Admitting: *Deleted

## 2018-01-24 ENCOUNTER — Emergency Department (HOSPITAL_COMMUNITY)
Admission: EM | Admit: 2018-01-24 | Discharge: 2018-01-24 | Disposition: A | Discharge status: Home / Self Care | Payer: Medicare Other | Attending: Emergency Medicine | Admitting: Emergency Medicine

## 2018-01-24 DIAGNOSIS — I2510 Atherosclerotic heart disease of native coronary artery without angina pectoris: Secondary | ICD-10-CM | POA: Diagnosis not present

## 2018-01-24 DIAGNOSIS — I129 Hypertensive chronic kidney disease with stage 1 through stage 4 chronic kidney disease, or unspecified chronic kidney disease: Secondary | ICD-10-CM | POA: Insufficient documentation

## 2018-01-24 DIAGNOSIS — Z79899 Other long term (current) drug therapy: Secondary | ICD-10-CM | POA: Diagnosis not present

## 2018-01-24 DIAGNOSIS — Z794 Long term (current) use of insulin: Secondary | ICD-10-CM | POA: Diagnosis not present

## 2018-01-24 DIAGNOSIS — E039 Hypothyroidism, unspecified: Secondary | ICD-10-CM | POA: Insufficient documentation

## 2018-01-24 DIAGNOSIS — J302 Other seasonal allergic rhinitis: Secondary | ICD-10-CM | POA: Diagnosis not present

## 2018-01-24 DIAGNOSIS — Z7982 Long term (current) use of aspirin: Secondary | ICD-10-CM | POA: Diagnosis not present

## 2018-01-24 DIAGNOSIS — Y829 Unspecified medical devices associated with adverse incidents: Secondary | ICD-10-CM | POA: Diagnosis not present

## 2018-01-24 DIAGNOSIS — T82838A Hemorrhage of vascular prosthetic devices, implants and grafts, initial encounter: Secondary | ICD-10-CM | POA: Diagnosis not present

## 2018-01-24 DIAGNOSIS — N186 End stage renal disease: Secondary | ICD-10-CM | POA: Diagnosis not present

## 2018-01-24 DIAGNOSIS — D631 Anemia in chronic kidney disease: Secondary | ICD-10-CM | POA: Diagnosis not present

## 2018-01-24 DIAGNOSIS — Z87891 Personal history of nicotine dependence: Secondary | ICD-10-CM | POA: Diagnosis not present

## 2018-01-24 DIAGNOSIS — E1129 Type 2 diabetes mellitus with other diabetic kidney complication: Secondary | ICD-10-CM | POA: Diagnosis not present

## 2018-01-24 DIAGNOSIS — N184 Chronic kidney disease, stage 4 (severe): Secondary | ICD-10-CM | POA: Diagnosis not present

## 2018-01-24 DIAGNOSIS — R197 Diarrhea, unspecified: Secondary | ICD-10-CM | POA: Diagnosis not present

## 2018-01-24 DIAGNOSIS — E119 Type 2 diabetes mellitus without complications: Secondary | ICD-10-CM | POA: Insufficient documentation

## 2018-01-24 DIAGNOSIS — N2581 Secondary hyperparathyroidism of renal origin: Secondary | ICD-10-CM | POA: Diagnosis not present

## 2018-01-24 DIAGNOSIS — T82898A Other specified complication of vascular prosthetic devices, implants and grafts, initial encounter: Secondary | ICD-10-CM | POA: Diagnosis not present

## 2018-01-24 LAB — CBC
HCT: 25.7 % — ABNORMAL LOW (ref 39.0–52.0)
Hemoglobin: 8.4 g/dL — ABNORMAL LOW (ref 13.0–17.0)
MCH: 34.1 pg — ABNORMAL HIGH (ref 26.0–34.0)
MCHC: 32.7 g/dL (ref 30.0–36.0)
MCV: 104.5 fL — ABNORMAL HIGH (ref 78.0–100.0)
Platelets: 254 10*3/uL (ref 150–400)
RBC: 2.46 MIL/uL — ABNORMAL LOW (ref 4.22–5.81)
RDW: 20.6 % — ABNORMAL HIGH (ref 11.5–15.5)
WBC: 7.8 10*3/uL (ref 4.0–10.5)

## 2018-01-24 NOTE — Telephone Encounter (Signed)
Tradgenta 5 mg Approved on April 29 CaseId:[id number];Status:Approved;  Review Type:Prior Auth;  Coverage Start Date:12/21/2017 Coverage End Date:09/23/2098

## 2018-01-24 NOTE — Discharge Instructions (Signed)
Continue your medications, follow up with your doctors as scheduled

## 2018-01-24 NOTE — ED Provider Notes (Signed)
[facility] EMERGENCY DEPARTMENT Provider Note   CSN: [id number] Arrival date & time: 01/24/18  1218     History   Chief Complaint Bleeding dialysis graft HPI [name] is a 77 y.o. male.  HPI Patient presents to the emergency room for evaluation of bleeding from a dialysis graft.  Patient goes to dialysis Monday Wednesday Friday.  He was getting his treatment today.  Patient started having bleeding from the superior aspect of his dialysis graft.  Patient states the staff at the facility tried to apply pressure several times but the bleeding continued.  He was sent to the emergency room for further evaluation.  Patient had a pressure device applied to the area of bleeding.  He presents to the emergency room with that device still in place and he still has his graft accessed.  Patient states currently he does not appear to be bleeding.  He thinks he lost a lot of blood.  He does denies any chest pain shortness of breath or lightheadedness. Past Medical History:  Diagnosis Date  . Allergy   . Anemia   . Arthritis   . Cataract    bil cateracts removed  . Coronary artery disease   . Diabetes mellitus    Type 2  . Diverticulitis   . ED (erectile dysfunction)   . Elevated homocysteine ([place])   . ESRD (end stage renal disease) on dialysis ([place]) 03/2015  . GERD (gastroesophageal reflux disease)    pepto   . Gout   . Hyperlipidemia   . Hypertension   . Hypothyroidism   . Pneumonia   . PVD (peripheral vascular disease) ([place])    has plastic aorta  . Renal insufficiency   . Seasonal allergies   . Shortness of breath dyspnea   . Sleep apnea    does not wear c-pap  . Thyroid disease     Patient Active Problem List   Diagnosis Date Noted  . Low back pain 01/17/2018  . Anorexia   . Dyspnea   . Macrocytic anemia 06/04/2016  . Thrombocytopenia ([place]) 06/04/2016  . Exertional dyspnea 06/04/2016  . Arm paresthesia, left 06/04/2016  . Dyspnea on exertion  06/04/2016  . Tenderness of right calf 06/04/2016  . Mild cognitive impairment with memory loss 01/12/2016  . ESRD on dialysis ([place]) 05/17/2015  . Hypoglycemia   . Weakness 02/18/2015  . OSA (obstructive sleep apnea) 02/02/2015  . Hyperkalemia 10/05/2014  . Chronic kidney disease (CKD), stage IV (severe) ([place]) 04/07/2014  . End stage renal disease ([place]) 11/18/2013  . BPH (benign prostatic hyperplasia) 11/22/2012  . Peripheral vascular disease ([place]) 12/24/2011  . Hyperparathyroidism ([place]) 12/24/2011  . Hypothyroidism 12/24/2011  . Allergic rhinitis 12/24/2011  . Erectile dysfunction 12/24/2011  . Insulin dependent diabetes mellitus ([place]) 09/03/2008  . Hyperlipidemia 09/03/2008  . Essential hypertension 08/30/2008  . PANCREATITIS, HX OF 08/30/2008  . RENAL FAILURE, ACUTE, HX OF 08/30/2008  . DIVERTICULOSIS, COLON 06/08/2003    Past Surgical History:  Procedure Laterality Date  . aortobifemoral bypass    . AV FISTULA PLACEMENT Left 12/01/2013   Procedure: ARTERIOVENOUS (AV) FISTULA CREATION- LEFT BRACHIOCEPHALIC;  Surgeon: Angelia Mould, MD;  Location: [facility];  Service: Vascular;  Laterality: Left;  . [place] TRANSPOSITION Right 07/27/2014   Procedure: BASCILIC VEIN TRANSPOSITION;  Surgeon: Angelia Mould, MD;  Location: [facility];  Service: Vascular;  Laterality: Right;  . BREAST SURGERY     left - granulomatous mastitis  . COLONOSCOPY    .  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 EYE SURGERY Bilateral    cataracts  . HEMODIALYSIS INPATIENT  01/17/2018      . REVISON OF ARTERIOVENOUS FISTULA Left 02/09/2014   Procedure: REVISON OF LEFT ARTERIOVENOUS FISTULA - RESECTION OF RENDUNDANT VEIN;  Surgeon: Angelia Mould, MD;  Location: [facility];  Service: Vascular;  Laterality: Left;  . SBO with lysis adhesions    . SHUNTOGRAM Left 04/19/2014   Procedure: FISTULOGRAM;  Surgeon: Angelia Mould, MD;  Location: [facility] CATH LAB;  Service: Cardiovascular;   Laterality: Left;  . UNILATERAL UPPER EXTREMEITY ANGIOGRAM N/A 07/12/2014   Procedure: UNILATERAL UPPER Anselmo Rod;  Surgeon: Angelia Mould, MD;  Location: [facility] CATH LAB;  Service: Cardiovascular;  Laterality: N/A;        Home Medications    Prior to Admission medications   Medication Sig Start Date End Date Taking? Authorizing Provider  acetaminophen (TYLENOL) 500 MG tablet Take 1,000 mg by mouth 2 (two) times daily as needed (for pain).     [provider]  allopurinol (ZYLOPRIM) 100 MG tablet Take 100 mg by mouth daily as needed (pain).     [provider]  aspirin 325 MG tablet Take 325 mg by mouth daily.     [provider]  Blood Glucose Monitoring Suppl (FREESTYLE FREEDOM LITE) w/Device KIT Use to check blood sugar 2 times per day dx code E11.65 03/08/16   Elayne Snare, MD  cephALEXin (KEFLEX) 500 MG capsule Take 1 capsule (500 mg total) by mouth 4 (four) times daily. Patient not taking: Reported on 01/23/2018 01/13/18   Charlann Lange, PA-C  clobetasol (TEMOVATE) 0.05 % external solution apply to affected area ON THE SCALP UP TO 2 TIMES A DAY AS NEEDED...  (REFER TO PRESCRIPTION NOTES). 04/19/15   [provider]  docusate sodium (COLACE) 100 MG capsule Take 1 capsule (100 mg total) by mouth daily. To avoid constipation from pain medication 01/02/18   Tanna Furry, MD  donepezil (ARICEPT) 10 MG tablet Take 1 tablet (10 mg total) by mouth at bedtime. 01/12/16   Melvenia Beam, MD  Fluocinolone Acetonide Body 0.01 % OIL Apply 1 ml on the skin daily 12/26/17   [provider]  FLUOCINOLONE ACETONIDE SCALP 0.01 % OIL apply to affected area ON SCALP EVERY NIGHT AT BEDTIME, [place] UPON AWAKENING 04/28/15   [provider]  fluticasone (CUTIVATE) 0.05 % cream apply to affected area ON FACE UP TO 2 TIMES A DAY AS NEEDED 04/19/15   [provider]  fluticasone (FLONASE) 50 MCG/ACT nasal spray Place 1 spray into both nostrils  daily. Patient taking differently: Place 1 spray into both nostrils daily as needed for allergies.  10/08/14   Mikhail, Velta Addison, DO  glucose blood (FREESTYLE LITE) test strip Use as instructed 01/22/18   Elayne Snare, MD  glucose blood (ONETOUCH VERIO) test strip Use to check blood sugar 2 times per day. Dx code E11.65 03/01/16   Elayne Snare, MD  Insulin Pen Needle (NOVOFINE) 30G X 8 MM MISC Use to inject insulin 3 times daily 04/03/17   Elayne Snare, MD  ketoconazole (NIZORAL) 2 % shampoo Apply 1 application topically 2 (two) times a week. 03/31/15   Elby Showers, MD  Lancets (FREESTYLE) lancets Use as instructed to check blood sugar 2 times per day dx code E11.65 02/22/16   Elayne Snare, MD  levothyroxine (SYNTHROID, LEVOTHROID) 50 MCG tablet Take 1 tablet (50 mcg total)  by mouth daily. 08/06/14   Elby Showers, MD  lidocaine-prilocaine (EMLA) cream Apply 1 application topically every Monday, Wednesday, and Friday. AS DIRECTED 03/29/15   [provider]  linagliptin (TRADJENTA) 5 MG TABS tablet Take 1 tablet (5 mg total) by mouth daily. 01/20/18   Elayne Snare, MD  loratadine (CLARITIN) 10 MG tablet Take 10 mg by mouth daily as needed for allergies.     [provider]  methocarbamol (ROBAXIN) 500 MG tablet Take 1 tablet (500 mg total) by mouth at bedtime as needed for muscle spasms. 01/02/18   Tanna Furry, MD  metroNIDAZOLE (METROGEL) 0.75 % gel APPLY TO AFFECTED AREA ON SKIN  ON FACE TWICE DAILY 06/14/15   [provider]  mupirocin ointment (BACTROBAN) 2 % Use as directed to affected areas Patient taking differently: Apply 1 application topically 3 (three) times daily as needed (to affected areas).  09/04/16   Elby Showers, MD  Naftifine HCl (NAFTIN) 2 % CREA Apply to feet daily for fungal infection Patient taking differently: Apply topically daily as needed (for fungal infection). To feet 09/12/15   Elby Showers, MD  nystatin-triamcinolone (MYCOLOG II) cream Apply 1 application  topically 2 (two) times daily. 09/26/17   Elby Showers, MD  omeprazole (PRILOSEC) 40 MG capsule Take 1 capsule (40 mg total) by mouth daily. 10/17/17   Esterwood, Amy S, PA-C  ondansetron (ZOFRAN ODT) 4 MG disintegrating tablet Take 1 tablet (4 mg total) by mouth every 8 (eight) hours as needed for nausea or vomiting. 01/23/18   Fredia Sorrow, MD  pioglitazone (ACTOS) 30 MG tablet take 1 tablet by mouth once daily Patient not taking: Reported on 01/23/2018 05/30/17   Elayne Snare, MD  Probiotic Product (PROBIOTIC PO) Take 1 tablet by mouth daily.    [provider]  RENVELA 800 MG tablet Take 800-1,600 mg by mouth See admin instructions. Take 1 tablet with breakfast and at lunch then take 2 tablets at dinner and 800 mg with each snack 07/27/14   [provider]  simvastatin (ZOCOR) 20 MG tablet Take 20 mg by mouth at bedtime.     [provider]  Teriparatide, Recombinant, (FORTEO) 600 MCG/2.4ML SOLN Inject 0.08 mLs (20 mcg total) into the skin daily. Patient not taking: Reported on 01/23/2018 01/21/18   Elayne Snare, MD  triamcinolone ointment (KENALOG) 0.1 % Apply 1 application topically 2 (two) times daily. Use between thighs    [provider]    Family History Family History  Problem Relation Age of Onset  . Aneurysm Mother   . Heart disease Father   . Stroke Father   . Hypertension Father   . Diabetes Father   . Dementia Neg Hx   . Colon cancer Neg Hx   . Esophageal cancer Neg Hx   . Pancreatic cancer Neg Hx   . Prostate cancer Neg Hx   . Rectal cancer Neg Hx   . Stomach cancer Neg Hx     Social History Social History   Tobacco Use  . Smoking status: Former Smoker    Last attempt to quit: 09/24/1994    Years since quitting: 23.3  . Smokeless tobacco: Never Used  Substance Use Topics  . Alcohol use: No    Alcohol/week: 0.0 oz    Comment: Quit Oct. 1977 ("somewhat heavy")  . Drug use: No     Allergies   Penicillins   Review of  Systems Review of Systems  Gastrointestinal: Positive for diarrhea (  chronic).  All other systems reviewed and are negative.    Physical Exam Updated Vital Signs There were no vitals taken for this visit.  Physical Exam  Constitutional: No distress.  Chronically ill appearing  HENT:  Head: Normocephalic and atraumatic.  Right Ear: External ear normal.  Left Ear: External ear normal.  Eyes: Conjunctivae are normal. Right eye exhibits no discharge. Left eye exhibits no discharge. No scleral icterus.  Neck: Neck supple. No tracheal deviation present.  Cardiovascular: Normal rate, regular rhythm and intact distal pulses.  Pulmonary/Chest: Effort normal and breath sounds normal. No stridor. No respiratory distress. He has no wheezes. He has no rales.  Abdominal: Soft. Bowel sounds are normal. He exhibits no distension. There is no tenderness. There is no rebound and no guarding.  Musculoskeletal: He exhibits no edema or tenderness.  Right av fistula site with bandage over puncture site, no bleeding noted, access needles still in place  Neurological: He is alert. He has normal strength. No cranial nerve deficit (no facial droop, extraocular movements intact, no slurred speech) or sensory deficit. He exhibits normal muscle tone. He displays no seizure activity. Coordination normal.  Skin: Skin is warm and dry. No rash noted. He is not diaphoretic.  Psychiatric: He has a normal mood and affect.  Nursing note and vitals reviewed.    ED Treatments / Results  Labs (all labs ordered are listed, but only abnormal results are displayed) Labs Reviewed  CBC - Abnormal; Notable for the following components:      Result Value   RBC 2.46 (*)    Hemoglobin 8.4 (*)    HCT 25.7 (*)    MCV 104.5 (*)    MCH 34.1 (*)    RDW 20.6 (*)    All other components within normal limits     Radiology Ct Abdomen Pelvis W Contrast  Result Date: 01/23/2018 CLINICAL DATA:  77 year old male with  constipation, diarrhea and abdominal pain EXAM: CT ABDOMEN AND PELVIS WITH CONTRAST TECHNIQUE: Multidetector CT imaging of the abdomen and pelvis was performed using the standard protocol following bolus administration of intravenous contrast. CONTRAST:  130m OMNIPAQUE IOHEXOL 300 MG/ML  SOLN COMPARISON:  Prior CT scan of the abdomen and pelvis 10/03/2017 FINDINGS: Lower chest: The lung bases are clear. Visualized cardiac structures are within normal limits for size. No pericardial effusion. Unremarkable visualized distal thoracic esophagus. Calcifications noted at the aortic root. Calcifications also present along the left anterior descending coronary artery. Hepatobiliary: Small circumscribed low-attenuation hepatic lesions are too small to characterize but unchanged across multiple prior studies consistent with small benign cysts or biliary hamartomas. No enhancing hepatic lesion. Gallbladder is unremarkable. No intra or extrahepatic biliary ductal dilatation. Pancreas: Unremarkable. No pancreatic ductal dilatation or surrounding inflammatory changes. Spleen: Normal in size without focal abnormality. Adrenals/Urinary Tract: The adrenal glands are normal. No enhancing renal mass, hydronephrosis or nephrolithiasis. Significant renal atrophy bilaterally. Multiple circumscribed low-attenuation cystic lesions consistent with acquired cystic disease of dialysis. Stomach/Bowel: Colonic diverticular disease without CT evidence of active inflammation. No evidence of obstruction or focal bowel wall thickening. Normal appendix in the right lower quadrant. The terminal ileum is unremarkable. Vascular/Lymphatic: Atherosclerotic calcifications throughout the abdominal aorta. Calcified plaque is present at the origin of the celiac artery and superior mesenteric artery. The degree of underlying stenosis is difficult to excess on this non arterial phase study. Status post aorto bi femoral bypass. Reproductive: Prostate is  unremarkable. Other: No abdominal wall hernia or abnormality. No abdominopelvic ascites. Musculoskeletal: New  L1 and L2 compression fractures compared to October 03, 2017. These were seen on more recent prior lumbar spine imaging from April of 2019. No significant interval progression. Stable L5 compression fracture. Multilevel degenerative disc disease. Chronic L3 compression fracture status post cement augmentation. IMPRESSION: 1. No acute abnormality within the abdomen or pelvis. 2. Extensive atherosclerotic vascular calcifications including coronary artery and aortic. 3. Renal atrophy bilaterally consistent with known end-stage renal disease. Acquired renal cystic disease of hemodialysis. 4. Colonic diverticular disease without CT evidence of active inflammation. 5. Surgical changes of prior aortobifemoral bypass. 6. Stable L1, L2, L3 and L5 compression fractures. Electronically Signed   By: Jacqulynn Cadet M.D.   On: 01/23/2018 20:56    Procedures Procedures (including critical care time)  Medications Ordered in ED Medications - No data to display   Initial Impression / Assessment and Plan / ED Course  I have reviewed the triage vital signs and the nursing notes.  Pertinent labs & imaging results that were available during my care of the patient were reviewed by me and considered in my medical decision making (see chart for details).  Clinical Course as of Jan 27 899  Fri Jan 24, 2018  1509 Anemia noted.  Hgb less than yesterday but similar to 6 days ago.  I doubt serious blood loss.  Bleeding has stopped.  No further bleeding.  Pt is stable for discharge   [JK]    Clinical Course User Index [JK] Dorie Rank, MD   IV team consulted to remove dialysis access  Final Clinical Impressions(s) / ED Diagnoses   Final diagnoses:  Bleeding from dialysis shunt, initial encounter [facility])    ED Discharge Orders    None       Dorie Rank, MD 01/27/18 [id number]

## 2018-01-24 NOTE — ED Triage Notes (Signed)
BIB EMS from dialysis for further eval of bleeding graft - 1 of 2 needles removed from graft to RUE by nurse at dialysis clinic, 2nd needle remains intact; clamp noted over site that was reportedly bleeding; pt awake, alert, oriented x4; c/o burning sensation to clamp site

## 2018-01-24 NOTE — ED Notes (Signed)
Order placed for IV team to deaccess graft to RUE

## 2018-01-24 NOTE — ED Notes (Signed)
Ice chips given per pt request

## 2018-01-24 NOTE — ED Notes (Signed)
Clamp removed by ED MD; no bleeding noted from proximal aspect of graft at this time; pt given call bell; instructed to call for nurse if bleeding restarted - verbalized understanding

## 2018-01-25 LAB — URINE CULTURE: Culture: NO GROWTH

## 2018-01-27 DIAGNOSIS — E1129 Type 2 diabetes mellitus with other diabetic kidney complication: Secondary | ICD-10-CM | POA: Diagnosis not present

## 2018-01-27 DIAGNOSIS — N186 End stage renal disease: Secondary | ICD-10-CM | POA: Diagnosis not present

## 2018-01-27 DIAGNOSIS — N2581 Secondary hyperparathyroidism of renal origin: Secondary | ICD-10-CM | POA: Diagnosis not present

## 2018-01-27 DIAGNOSIS — E119 Type 2 diabetes mellitus without complications: Secondary | ICD-10-CM | POA: Diagnosis not present

## 2018-01-27 DIAGNOSIS — D631 Anemia in chronic kidney disease: Secondary | ICD-10-CM | POA: Diagnosis not present

## 2018-01-29 DIAGNOSIS — E119 Type 2 diabetes mellitus without complications: Secondary | ICD-10-CM | POA: Diagnosis not present

## 2018-01-29 DIAGNOSIS — D631 Anemia in chronic kidney disease: Secondary | ICD-10-CM | POA: Diagnosis not present

## 2018-01-29 DIAGNOSIS — N2581 Secondary hyperparathyroidism of renal origin: Secondary | ICD-10-CM | POA: Diagnosis not present

## 2018-01-29 DIAGNOSIS — E1129 Type 2 diabetes mellitus with other diabetic kidney complication: Secondary | ICD-10-CM | POA: Diagnosis not present

## 2018-01-29 DIAGNOSIS — N186 End stage renal disease: Secondary | ICD-10-CM | POA: Diagnosis not present

## 2018-01-31 DIAGNOSIS — E1129 Type 2 diabetes mellitus with other diabetic kidney complication: Secondary | ICD-10-CM | POA: Diagnosis not present

## 2018-01-31 DIAGNOSIS — N2581 Secondary hyperparathyroidism of renal origin: Secondary | ICD-10-CM | POA: Diagnosis not present

## 2018-01-31 DIAGNOSIS — E119 Type 2 diabetes mellitus without complications: Secondary | ICD-10-CM | POA: Diagnosis not present

## 2018-01-31 DIAGNOSIS — N186 End stage renal disease: Secondary | ICD-10-CM | POA: Diagnosis not present

## 2018-01-31 DIAGNOSIS — D631 Anemia in chronic kidney disease: Secondary | ICD-10-CM | POA: Diagnosis not present

## 2018-02-03 DIAGNOSIS — E1129 Type 2 diabetes mellitus with other diabetic kidney complication: Secondary | ICD-10-CM | POA: Diagnosis not present

## 2018-02-03 DIAGNOSIS — D631 Anemia in chronic kidney disease: Secondary | ICD-10-CM | POA: Diagnosis not present

## 2018-02-03 DIAGNOSIS — N186 End stage renal disease: Secondary | ICD-10-CM | POA: Diagnosis not present

## 2018-02-03 DIAGNOSIS — N2581 Secondary hyperparathyroidism of renal origin: Secondary | ICD-10-CM | POA: Diagnosis not present

## 2018-02-03 DIAGNOSIS — E119 Type 2 diabetes mellitus without complications: Secondary | ICD-10-CM | POA: Diagnosis not present

## 2018-02-04 ENCOUNTER — Encounter: Payer: Self-pay | Admitting: Internal Medicine

## 2018-02-04 ENCOUNTER — Telehealth: Payer: Self-pay | Admitting: Internal Medicine

## 2018-02-04 ENCOUNTER — Ambulatory Visit (INDEPENDENT_AMBULATORY_CARE_PROVIDER_SITE_OTHER): Payer: Medicare Other | Admitting: Internal Medicine

## 2018-02-04 VITALS — BP 100/80 | HR 76 | Temp 98.2°F | Ht 71.0 in | Wt 167.0 lb

## 2018-02-04 DIAGNOSIS — T148XXA Other injury of unspecified body region, initial encounter: Secondary | ICD-10-CM | POA: Diagnosis not present

## 2018-02-04 DIAGNOSIS — S22000A Wedge compression fracture of unspecified thoracic vertebra, initial encounter for closed fracture: Secondary | ICD-10-CM | POA: Diagnosis not present

## 2018-02-04 DIAGNOSIS — N186 End stage renal disease: Secondary | ICD-10-CM | POA: Diagnosis not present

## 2018-02-04 DIAGNOSIS — I10 Essential (primary) hypertension: Secondary | ICD-10-CM

## 2018-02-04 DIAGNOSIS — F321 Major depressive disorder, single episode, moderate: Secondary | ICD-10-CM | POA: Diagnosis not present

## 2018-02-04 DIAGNOSIS — IMO0001 Reserved for inherently not codable concepts without codable children: Secondary | ICD-10-CM

## 2018-02-04 DIAGNOSIS — E538 Deficiency of other specified B group vitamins: Secondary | ICD-10-CM

## 2018-02-04 DIAGNOSIS — Z992 Dependence on renal dialysis: Secondary | ICD-10-CM

## 2018-02-04 DIAGNOSIS — E119 Type 2 diabetes mellitus without complications: Secondary | ICD-10-CM | POA: Diagnosis not present

## 2018-02-04 DIAGNOSIS — Z794 Long term (current) use of insulin: Secondary | ICD-10-CM | POA: Diagnosis not present

## 2018-02-04 DIAGNOSIS — M549 Dorsalgia, unspecified: Secondary | ICD-10-CM

## 2018-02-04 NOTE — Progress Notes (Signed)
   Subjective:    Patient ID: [name], male    DOB: [birth year], 77 y.o.   MRN: [id number]  HPI He is here today accompanied by his wife.  He has had 2 recent trips to the Emergency Department.  Most recent one was on May 3 when he was bleeding from his dialysis shunt.  He had weight a long time and was frustrated.  The other visit had to do with abdominal pain.  He was  admitted to the hospital April 26 with hypoglycemia.  Dr. Dwyane Dee has suggested his diabetic medication.  He is no longer on insulin.  He has not had any further episodes.  Patient has lost 19 pounds since February.  Says he does not have any appetite.  Has developed 2 or 3 areas of superficial skin breakdown in the buttock area.  He he wants to be referred to wound care center and this will be done.  I requested home health services for him but wife says they will not provide those because he is ambulatory.  Therefore, we are going to order physical therapy for him.  He has difficulty standing up out of a chair and appears to have quadriceps weakness.    Review of Systems     Objective:   Physical Exam  His chest is clear.  Cardiac exam regular rate and rhythm.  He has trouble getting up from a sitting position.  Appears to have some quadriceps weakness.  No carotid bruits.  No lower extremity edema.  His affect is frustrated with his situation.      Assessment & Plan:  Deconditioning  End-stage kidney disease on dialysis  Sacral decubiti-to be referred to wound care center  Depression  Weight loss  Diabetes mellitus with episodes of hypoglycemia  History of colonic diverticular disease  Status post aorto bifemoral bypass  History of compression fractures L1-L2-L3 and L5  Plan: He has appointment to see Dr. Lorrene Reid in June.  He will return to the wound care center.  Physical therapy will be ordered.  He is not eligible apparently for home physical therapy but wife is able to take him to the physical  therapy facility.

## 2018-02-05 ENCOUNTER — Telehealth: Payer: Self-pay | Admitting: Endocrinology

## 2018-02-05 DIAGNOSIS — D631 Anemia in chronic kidney disease: Secondary | ICD-10-CM | POA: Diagnosis not present

## 2018-02-05 DIAGNOSIS — N186 End stage renal disease: Secondary | ICD-10-CM | POA: Diagnosis not present

## 2018-02-05 DIAGNOSIS — E119 Type 2 diabetes mellitus without complications: Secondary | ICD-10-CM | POA: Diagnosis not present

## 2018-02-05 DIAGNOSIS — E1129 Type 2 diabetes mellitus with other diabetic kidney complication: Secondary | ICD-10-CM | POA: Diagnosis not present

## 2018-02-05 DIAGNOSIS — N2581 Secondary hyperparathyroidism of renal origin: Secondary | ICD-10-CM | POA: Diagnosis not present

## 2018-02-05 NOTE — Telephone Encounter (Signed)
Received fax from [facility] has an appointment for 02/13/18 @2pm 

## 2018-02-05 NOTE — Telephone Encounter (Signed)
Spoke with patient's wife and informed her again of the conversation that took place on 01/23/2018 in which she was told that Dr. Dwyane Dee and patient's nephrologist do not want him to take him the Forteo but instead take Prolia. Patient's wife was informed that staff in the office will take care of all the insurance information and call her to schedule her husbands shot. Patien's wife verbalized understanding.

## 2018-02-05 NOTE — Telephone Encounter (Signed)
Schedule appointment for Wound Care referral 02/20/18 @8am  with Dr Dellia Nims  Scheduled appointment with Dr Lorrene Reid at [facility] 03/11/18 @10 :00am  Faxed referral to [place] with shirley she is aware of appointments and will call Oneita Hurt to schedule that appointment.

## 2018-02-05 NOTE — Telephone Encounter (Signed)
Patient needs to know where to buy the needles for the Forteo. Please call ph# [phone] to advise

## 2018-02-07 DIAGNOSIS — E1129 Type 2 diabetes mellitus with other diabetic kidney complication: Secondary | ICD-10-CM | POA: Diagnosis not present

## 2018-02-07 DIAGNOSIS — D631 Anemia in chronic kidney disease: Secondary | ICD-10-CM | POA: Diagnosis not present

## 2018-02-07 DIAGNOSIS — N2581 Secondary hyperparathyroidism of renal origin: Secondary | ICD-10-CM | POA: Diagnosis not present

## 2018-02-07 DIAGNOSIS — E119 Type 2 diabetes mellitus without complications: Secondary | ICD-10-CM | POA: Diagnosis not present

## 2018-02-07 DIAGNOSIS — N186 End stage renal disease: Secondary | ICD-10-CM | POA: Diagnosis not present

## 2018-02-10 DIAGNOSIS — N2581 Secondary hyperparathyroidism of renal origin: Secondary | ICD-10-CM | POA: Diagnosis not present

## 2018-02-10 DIAGNOSIS — E119 Type 2 diabetes mellitus without complications: Secondary | ICD-10-CM | POA: Diagnosis not present

## 2018-02-10 DIAGNOSIS — N186 End stage renal disease: Secondary | ICD-10-CM | POA: Diagnosis not present

## 2018-02-10 DIAGNOSIS — E1129 Type 2 diabetes mellitus with other diabetic kidney complication: Secondary | ICD-10-CM | POA: Diagnosis not present

## 2018-02-10 DIAGNOSIS — D631 Anemia in chronic kidney disease: Secondary | ICD-10-CM | POA: Diagnosis not present

## 2018-02-11 ENCOUNTER — Encounter: Payer: Medicare Other | Attending: Physician Assistant | Admitting: Physician Assistant

## 2018-02-11 ENCOUNTER — Other Ambulatory Visit: Payer: Medicare Other

## 2018-02-11 DIAGNOSIS — Z87891 Personal history of nicotine dependence: Secondary | ICD-10-CM | POA: Insufficient documentation

## 2018-02-11 DIAGNOSIS — L89153 Pressure ulcer of sacral region, stage 3: Secondary | ICD-10-CM | POA: Insufficient documentation

## 2018-02-11 DIAGNOSIS — E1122 Type 2 diabetes mellitus with diabetic chronic kidney disease: Secondary | ICD-10-CM | POA: Diagnosis not present

## 2018-02-11 DIAGNOSIS — E1151 Type 2 diabetes mellitus with diabetic peripheral angiopathy without gangrene: Secondary | ICD-10-CM | POA: Insufficient documentation

## 2018-02-11 DIAGNOSIS — Z992 Dependence on renal dialysis: Secondary | ICD-10-CM | POA: Diagnosis not present

## 2018-02-11 DIAGNOSIS — N186 End stage renal disease: Secondary | ICD-10-CM | POA: Insufficient documentation

## 2018-02-11 DIAGNOSIS — E1140 Type 2 diabetes mellitus with diabetic neuropathy, unspecified: Secondary | ICD-10-CM | POA: Insufficient documentation

## 2018-02-11 DIAGNOSIS — L89152 Pressure ulcer of sacral region, stage 2: Secondary | ICD-10-CM | POA: Diagnosis not present

## 2018-02-12 DIAGNOSIS — E119 Type 2 diabetes mellitus without complications: Secondary | ICD-10-CM | POA: Diagnosis not present

## 2018-02-12 DIAGNOSIS — E1129 Type 2 diabetes mellitus with other diabetic kidney complication: Secondary | ICD-10-CM | POA: Diagnosis not present

## 2018-02-12 DIAGNOSIS — N2581 Secondary hyperparathyroidism of renal origin: Secondary | ICD-10-CM | POA: Diagnosis not present

## 2018-02-12 DIAGNOSIS — D631 Anemia in chronic kidney disease: Secondary | ICD-10-CM | POA: Diagnosis not present

## 2018-02-12 DIAGNOSIS — N186 End stage renal disease: Secondary | ICD-10-CM | POA: Diagnosis not present

## 2018-02-12 NOTE — Progress Notes (Signed)
HARNOOR, KOHLES ([id number]) Visit Report for 02/11/2018 Abuse/Suicide Risk Screen Details Patient Name: [name], [name]. Date of Service: 02/11/2018 8:00 AM Medical Record Number: [id number] Patient Account Number: [id number] Date of Birth/Sex: [birth year] (76 y.o. M) Treating RN: Montey Hora Primary Care Aeden Matranga: Tedra Senegal Other Clinician: Referring Idrees Quam: Referral, Self Treating Lakeisha Waldrop/Extender: STONE III, HOYT Weeks in Treatment: 0 Abuse/Suicide Risk Screen Items Answer ABUSE/SUICIDE RISK SCREEN: Has anyone close to you tried to hurt or harm you recentlyo No Do you feel uncomfortable with anyone in your familyo No Has anyone forced you do things that you didnot want to doo No Do you have any thoughts of harming yourselfo No Patient displays signs or symptoms of abuse and/or neglect. No Electronic Signature(s) Signed: 02/11/2018 4:45:01 PM By: Montey Hora Entered By: Montey Hora on 02/11/2018 08:15:45 [place], [name] ([id number]) -------------------------------------------------------------------------------- Activities of Daily Living Details Patient Name: [name]. Date of Service: 02/11/2018 8:00 AM Medical Record Number: [id number] Patient Account Number: [id number] Date of Birth/Sex: June 27, 1941 (76 y.o. M) Treating RN: Montey Hora Primary Care Marquarius Lofton: Tedra [place] Other Clinician: Referring Jorje Vanatta: Referral, Self Treating Kanoa Phillippi/Extender: [name], HOYT Weeks in Treatment: 0 Activities of Daily Living Items Answer Activities of Daily Living (Please select one for each item) Drive Automobile Completely Able Take Medications Completely Able Use Telephone Completely Able Care for Appearance Completely Able Use Toilet Completely Able Bath / Shower Need Assistance Dress Self Completely Able Feed Self Completely Able Walk Need Assistance Get In / Out Bed Completely [address] for Self Need Assistance Electronic Signature(s) Signed: 02/11/2018 4:45:01 PM By: Montey Hora Entered By: Montey Hora on 02/11/2018 08:16:15 [place], [name] ([id number]) -------------------------------------------------------------------------------- Education Assessment Details Patient Name: [name]. Date of Service: 02/11/2018 8:00 AM Medical Record Number: [id number] Patient Account Number: [id number] Date of Birth/Sex: 1941-06-06 (76 y.o. M) Treating RN: Montey Hora Primary Care Magdaline Zollars: Tedra [place] Other Clinician: Referring Nasteho Glantz: Referral, Self Treating Kelleigh Skerritt/Extender: [name], HOYT Weeks in Treatment: 0 Primary Learner Assessed: Patient Learning Preferences/Education Level/Primary Language Learning Preference: Explanation, Demonstration Highest Education Level: College or Above Preferred Language: English Cognitive Barrier Assessment/Beliefs Language Barrier: No Translator Needed: No Memory Deficit: No Emotional Barrier: No Cultural/Religious Beliefs Affecting Medical Care: No Physical Barrier Assessment Impaired Vision: No Impaired Hearing: No Decreased Hand dexterity: No Knowledge/Comprehension Assessment Knowledge Level: Medium Comprehension Level: Medium Ability to understand written Medium instructions: Ability to understand verbal Medium instructions: Motivation Assessment Anxiety Level: Calm Cooperation: Cooperative Education Importance: Acknowledges Need Interest in Health Problems: Asks Questions Perception: Coherent Willingness to Engage in Self- Medium Management Activities: Readiness to Engage in Self- Medium Management Activities: Electronic Signature(s) Signed: 02/11/2018 4:45:01 PM By: Montey Hora Entered By: Montey Hora on 02/11/2018 08:16:40 [place], [name] ([id number]) -------------------------------------------------------------------------------- Fall Risk Assessment  Details Patient Name: [name]. Date of Service: 02/11/2018 8:00 AM Medical Record Number: [id number] Patient Account Number: [id number] Date of Birth/Sex: Apr 22, 1941 (76 y.o. M) Treating RN: Montey Hora Primary Care Jamen Loiseau: Tedra [place] Other Clinician: Referring Sherley Leser: Referral, Self Treating Lennox Dolberry/Extender: [name], HOYT Weeks in Treatment: 0 Fall Risk Assessment Items Have you had 2 or more falls in the last 12 monthso 0 Yes Have you had any fall that resulted in injury in the last 12 monthso 0 Yes FALL RISK ASSESSMENT: History of falling - immediate or within 3 months 25 Yes Secondary diagnosis 0 No Ambulatory aid None/bed rest/wheelchair/nurse 0 No Crutches/cane/walker 15 Yes Furniture  0 No IV Access/Saline Lock 0 No Gait/Training Normal/bed rest/immobile 0 No Weak 10 Yes Impaired 0 No Mental Status Oriented to own ability 0 Yes Electronic Signature(s) Signed: 02/11/2018 4:45:01 PM By: Montey Hora Entered By: Montey Hora on 02/11/2018 08:17:03 [place], [name] ([id number]) -------------------------------------------------------------------------------- Foot Assessment Details Patient Name: [name]. Date of Service: 02/11/2018 8:00 AM Medical Record Number: [id number] Patient Account Number: [id number] Date of Birth/Sex: [birth year] (76 y.o. M) Treating RN: Montey Hora Primary Care Guilianna Mckoy: Tedra [place] Other Clinician: Referring Anuar Walgren: Referral, Self Treating Jarick Harkins/Extender: [name], HOYT Weeks in Treatment: 0 Foot Assessment Items Site Locations + = Sensation present, - = Sensation absent, C = Callus, U = Ulcer R = Redness, W = Warmth, M = Maceration, PU = Pre-ulcerative lesion F = Fissure, S = Swelling, D = Dryness Assessment Right: Left: Other Deformity: No No Prior Foot Ulcer: No No Prior Amputation: No No Charcot Joint: No No Ambulatory Status: Ambulatory With Help Assistance Device: Walker Gait:  Administrator, arts) Signed: 02/11/2018 4:45:01 PM By: Montey Hora Entered By: Montey Hora on 02/11/2018 08:18:06 Satz, [name] ([id number]) -------------------------------------------------------------------------------- Nutrition Risk Assessment Details Patient Name: [name]. Date of Service: 02/11/2018 8:00 AM Medical Record Number: [id number] Patient Account Number: [id number] Date of Birth/Sex: 1941-09-07 (76 y.o. M) Treating RN: Montey Hora Primary Care Erricka Falkner: Tedra [place] Other Clinician: Referring Hendel Gatliff: Referral, Self Treating Senia Even/Extender: STONE III, HOYT Weeks in Treatment: 0 Height (in): 71 Weight (lbs): 177 Body Mass Index (BMI): 24.7 Nutrition Risk Assessment Items NUTRITION RISK SCREEN: I have an illness or condition that made me change the kind and/or amount of 0 No food I eat I eat fewer than two meals per day 0 No I eat few fruits and vegetables, or milk products 0 No I have three or more drinks of beer, liquor or wine almost every day 0 No I have tooth or mouth problems that make it hard for me to eat 0 No I don't always have enough money to buy the food I need 0 No I eat alone most of the time 0 No I take three or more different prescribed or over-the-counter drugs a day 1 Yes Without wanting to, I have lost or gained 10 pounds in the last six months 2 Yes I am not always physically able to shop, cook and/or feed myself 2 Yes Nutrition Protocols Good Risk Protocol Provide education on Moderate Risk Protocol 0 nutrition Electronic Signature(s) Signed: 02/11/2018 4:45:01 PM By: Montey Hora Entered By: Montey Hora on 02/11/2018 08:17:30

## 2018-02-12 NOTE — Progress Notes (Signed)
MONTERIO, BOB ([id number]) Visit Report for 02/11/2018 Chief Complaint Document Details Patient Name: [name], Maqueda. Date of Service: 02/11/2018 8:00 AM Medical Record Number: [id number] Patient Account Number: [id number] Date of Birth/Sex: 12-01-1940 (76 y.o. M) Treating RN: Cornell Barman Primary Care Provider: Tedra Senegal Other Clinician: Referring Provider: Referral, Self Treating Provider/Extender: [name], HOYT Weeks in Treatment: 0 Information Obtained from: Patient Chief Complaint Sacral pressure ulcer Electronic Signature(s) Signed: 02/11/2018 6:53:41 PM By: Worthy Keeler PA-C Entered By: Worthy Keeler on 02/11/2018 08:44:51 Zentz, [name] ([id number]) -------------------------------------------------------------------------------- Debridement Details Patient Name: [name]. Date of Service: 02/11/2018 8:00 AM Medical Record Number: [id number] Patient Account Number: [id number] Date of Birth/Sex: 03/15/1941 (76 y.o. M) Treating RN: Cornell Barman Primary Care Provider: Tedra Senegal Other Clinician: Referring Provider: Referral, Self Treating Provider/Extender: [name], HOYT Weeks in Treatment: 0 Debridement Performed for Wound #5 Sacrum Assessment: Performed By: Physician STONE III, HOYT E., PA-C Debridement Type: Debridement Pre-procedure Verification/Time Yes - 08:49 Out Taken: Start Time: 08:49 Pain Control: Other : lidocaine 4% Total Area Debrided (L x W): 0.7 (cm) x 0.7 (cm) = 0.49 (cm) Tissue and other material Viable, Non-Viable, Slough, Subcutaneous, Slough debrided: Level: Skin/Subcutaneous Tissue Debridement Description: Excisional Instrument: Curette Bleeding: Minimum Hemostasis Achieved: Pressure End Time: 08:50 Procedural Pain: 3 Post Procedural Pain: 2 Response to Treatment: Procedure was tolerated well Level of Consciousness: Awake and Alert Post Procedure Vitals: Temperature: 97.39 Pulse: 76 Respiratory Rate: 16 Blood  Pressure: Systolic Blood Pressure: 024 Diastolic Blood Pressure: 90 Post Debridement Measurements of Total Wound Length: (cm) 0.7 Stage: Category/Stage II Width: (cm) 0.7 Depth: (cm) 0.2 Volume: (cm) 0.077 Character of Wound/Ulcer Post Stable Debridement: Post Procedure Diagnosis Same as Pre-procedure Electronic Signature(s) Signed: 02/11/2018 5:25:47 PM By: Gretta Cool, BSN, RN, CWS, Kim RN, BSN Signed: 02/11/2018 6:53:41 PM By: Worthy Keeler PA-C Entered By: Gretta Cool, BSN, RN, CWS, Kim on 02/11/2018 08:50:41 Jarchow, [name] ([id number]) Krusemark, [name] ([id number]) -------------------------------------------------------------------------------- HPI Details Patient Name: [name]. Date of Service: 02/11/2018 8:00 AM Medical Record Number: [id number] Patient Account Number: [id number] Date of Birth/Sex: 1941/09/05 (76 y.o. M) Treating RN: Cornell Barman Primary Care Provider: Tedra Senegal Other Clinician: Referring Provider: Referral, Self Treating Provider/Extender: STONE III, HOYT Weeks in Treatment: 0 History of Present Illness Location: periumbilical area. is on the left of the midline Quality: Patient reports No Pain. Severity: Patient states wound (s) are getting better. Duration: Patient has had the wound for < 2 weeks prior to presenting for treatment Context: The wound appeared gradually over time Modifying Factors: Patient is currently on renal dialysis and receives treatments 3 times weekly Associated Signs and Symptoms: Patient reports having:then local care with hydrogen peroxide and Bactroban ointment HPI Description: 77 year old gentleman seen earlier this year in January for a abdominal wound is now back with the same problem which has recurred for about 2 weeks and he has been trying to apply some local antibiotic ointment and a Band-Aid there. Of note he has significant pressure in this area due to his beltline and may have put on some weight which results in  his trousers being very tight around the waist. The else has changed in his HandP and his diabetes is pretty well controlled. most recent hemoglobin A1c on 12/06/2016 was 7.8%. he was reviewed by his endocrinologist Dr. Dwyane Dee who made appropriate modifications to his treatment plan. he was recently seen by neurology for mild cognitive impairment that was found to be normal for his age 72/21/18  patient appears to be doing well in regard to his abdominal wound area on evaluation today. He has been tolerating the dressing changes with the [facility] Dressing I'm pleased with how this has progressed and in fact the wound appears to be completely healed on evaluation today. There is definitely no evidence of infection. 03/29/17 Patient's wound on the abdominal region appears to be completely healed on evaluation today. This wound has been doing very well and I think the biggest thing is going to be keeping it from reactivation where it rubbed on his pants. 04/18/2017 -- he was recently discharged about 3 weeks ago and was using a binder which probably caused him abrasion on the area of the recently healed scar. 05/02/17 on evaluation today patient appears to be doing fairly well in regard to his abdominal wound at the site of the surgical scar. Unfortunately the scar tissue is very fragile and tends to reopen. He did get a small abdominal binder to try to pad the area in order to offload and prevent his pants from rubbing specifically at the site. With that being said he tells me that Dr. Con Memos was concerned that the binder might actually have rub the area causing a reopening. I did have a look at this today and unfortunately it appears that he was putting this over his pants which was making matters worse. 05/09/17 on evaluation today patient continues to do well with the [facility] Dressing. There is a slight skin tear inferior to the wound that we have been treating that may have been due to the  dressing sticking slightly. This is minimal however. I did discuss with patient prior to removal of the dressing for dressing changes that they whet this in order to loosen it and not risk damaging in the field. Nonetheless it appears that they have been doing very well with the dressing changes he had his wife took over the dressing changes as she states that he was doing it wrong. Overall though I'm pleased with how things are progressing. No fevers, chills, nausea, or vomiting noted at this time. Readmission: 08/27/17 on evaluation today patient appears to be doing similarly to how he was doing prior to healing previously August 2018 when we previously discharged him. He has fortunately been doing well up until just a couple of weeks ago and he had some of the [facility] Dressing over so he began to treat this on his own. He states that this has gotten much smaller but he ran out of supplies and therefore made an appointment to come back in for evaluation. No fevers, chills, nausea, or vomiting noted at this time. He has no discomfort at this point in regard to the wound. He does tell me that he has one maybe two compression fractures in his lower back and that he is going to be seen by the physician at [facility] imaging, Dr. Kristine [name] ([id number]) Deveshwar. ====== Old notes The 77 year old gentleman who has a past medical history of diabetes mellitus, end-stage renal disease on dialysis, hypertension, secondary hyperparathyroidism, peripheral vascular disease and history of previous arterial bifemoral bypass graft in 1992 also has a history of gout, hyperlipidemia, hypothyroidism. last hemoglobin A1c was 7.5%. status post AV fistula placement, breast surgery for left granulomatous mastitis, triple a repair in 1992, small bowel obstruction, upper arm angiograms for AV fistula on the left side. was noted to have a superficial skin wound in the periumbilical area since late December  2017. He  was asked to clean it with hydrogen peroxide and apply Bactroban. He was seen by vascular surgery in October 2017 where his right ABI was 0.56 left ABI was 0.87 and right TBI was 0.69 and left TBI was 0.70 ===== 09/25/16; patient was previously a patient of Dr. Ardeen Garland who has recurrent skin breakdown in the area of the large incision that was initially 4 apparently an open AAA aneurysm repair. He had secondary surgery but I'm not sure of what etiology this was. He is been using Hydrofera Blue to the open wounds in the mid abdominal surgical incision. This is now closed over. I think he also uses this area as his waist for his pants and I've asked him to adjust this Readmission: 02/11/18 on evaluation today patient presents for evaluation concerning a sacral pressure ulcer which has been present for several weeks. He states that he actually had several areas that were open there appears just be one remaining. He has been tolerating the dressing changes is wise been using Hydrofera Blue Dressing as left over from his abdominal ulcer that we have previously treated. Fortunately there does not appear to be any evidence of infection which is good news. With that being said he states that he's been having issues at one point even at dialysis he had to discontinue dialysis and go home due to the pain. He has not been using any cushion to sit on although he states he does have one that he can use. No fevers, chills, nausea, or vomiting noted at this time. He had a significant issue with diarrhea prior to this occurring and I believe this may have had some contributing factor as well. Electronic Signature(s) Signed: 02/11/2018 6:53:41 PM By: Worthy Keeler PA-C Entered By: Worthy Keeler on 02/11/2018 08:57:21 Choate, [name] ([id number]) -------------------------------------------------------------------------------- Physical Exam Details Patient Name: [name]. Date of Service:  02/11/2018 8:00 AM Medical Record Number: [id number] Patient Account Number: [id number] Date of Birth/Sex: 07-Apr-1941 (76 y.o. M) Treating RN: Cornell Barman Primary Care Provider: Tedra Senegal Other Clinician: Referring Provider: Referral, Self Treating Provider/Extender: STONE III, HOYT Weeks in Treatment: 0 Constitutional patient is hypertensive.. pulse regular and within target range for patient.Marland Kitchen respirations regular, non-labored and within target range for patient.Marland Kitchen temperature within target range for patient.. Well-nourished and well-hydrated in no acute distress. Eyes conjunctiva clear no eyelid edema noted. pupils equal round and reactive to light and accommodation. Ears, Nose, Mouth, and Throat no gross abnormality of ear auricles or external auditory canals. normal hearing noted during conversation. mucus membranes moist. Respiratory normal breathing without difficulty. clear to auscultation bilaterally. Cardiovascular regular rate and rhythm with normal S1, S2. no clubbing, cyanosis, significant edema, <3 sec cap refill. Gastrointestinal (GI) soft, non-tender, non-distended, +BS. no ventral hernia noted. Musculoskeletal Patient unable to walk without assistance. Psychiatric this patient is able to make decisions and demonstrates good insight into disease process. Alert and Oriented x 3. pleasant and cooperative. Notes On evaluation today patient's wound did show slough noted on the surface of the wound fortunately there does not appear to be any evidence of significant depth to the wound which is good news. Nonetheless I do think that this is something that does require sharp debridement this was discussed with the patient. He did after numbing give permission to proceed with debridement and tolerated this fairly well without significant discomfort which was good news. He did have a little bit of pain but fortunately this was again minimal. Electronic Signature(s) Signed:  02/11/2018 6:53:41 PM By: Worthy Keeler PA-C Entered By: Worthy Keeler on 02/11/2018 09:06:39 Bok, [name] -------------------------------------------------------------------------------- Physician Orders Details Patient Name: [name]. Date of Service: 02/11/2018 8:00 AM Medical Record Number: [id number] Patient Account Number: [id number] Date of Birth/Sex: [birth year] (76 y.o. M) Treating RN: Cornell Barman Primary Care Provider: Tedra Senegal Other Clinician: Referring Provider: Referral, Self Treating Provider/Extender: [name], HOYT Weeks in Treatment: 0 Verbal / Phone Orders: No Diagnosis Coding ICD-10 Coding Code Description L89.152 Pressure ulcer of sacral region, stage 2 E11.622 Type 2 diabetes mellitus with other skin ulcer Z99.2 Dependence on renal dialysis Wound Cleansing Wound #5 Sacrum o Clean wound with Normal Saline. Anesthetic (add to Medication List) Wound #5 Sacrum o Topical Lidocaine 4% cream applied to wound bed prior to debridement (In Clinic Only). o Benzocaine Topical Anesthetic Spray applied to wound bed prior to debridement (In Clinic Only). Skin Barriers/Peri-Wound Care Wound #5 Sacrum o Skin Prep Primary Wound Dressing Wound #5 Sacrum o Silver Collagen - moisten with saline Secondary Dressing Wound #5 Sacrum o Boardered Foam Dressing Dressing Change Frequency Wound #5 Sacrum o Change Dressing Monday, Wednesday, Friday Follow-up Appointments Wound #5 Sacrum o Return Appointment in 1 week. Off-Loading Wound #5 Sacrum o Other: - Take cushion to dialysis for offloading Dunivan, CHESLEY VEASEY ([id number]) Electronic Signature(s) Signed: 02/11/2018 5:25:47 PM By: Gretta Cool, BSN, RN, CWS, Kim RN, BSN Signed: 02/11/2018 6:53:41 PM By: Worthy Keeler PA-C Entered By: Gretta Cool, BSN, RN, CWS, Kim on 02/11/2018 08:53:46 Fulghum, [name]  ([id number]) -------------------------------------------------------------------------------- Problem List Details Patient Name: [name]. Date of Service: 02/11/2018 8:00 AM Medical Record Number: [id number] Patient Account Number: [id number] Date of Birth/Sex: December 13, 1940 (76 y.o. M) Treating RN: Cornell Barman Primary Care Provider: Tedra Senegal Other Clinician: Referring Provider: Referral, Self Treating Provider/Extender: [name], HOYT Weeks in Treatment: 0 Active Problems ICD-10 Impacting Encounter Code Description Active Date Wound Healing Diagnosis L89.153 Pressure ulcer of sacral region, stage 3 02/11/2018 Yes E11.622 Type 2 diabetes mellitus with other skin ulcer 02/11/2018 Yes Z99.2 Dependence on renal dialysis 02/11/2018 Yes Inactive Problems Resolved Problems Electronic Signature(s) Signed: 02/11/2018 6:53:41 PM By: Worthy Keeler PA-C Entered By: Worthy Keeler on 02/11/2018 08:57:50 Kludt, [name] ([id number]) -------------------------------------------------------------------------------- Progress Note Details Patient Name: [name]. Date of Service: 02/11/2018 8:00 AM Medical Record Number: [id number] Patient Account Number: [id number] Date of Birth/Sex: [birth year] (76 y.o. M) Treating RN: Cornell Barman Primary Care Provider: Tedra Senegal Other Clinician: Referring Provider: Referral, Self Treating Provider/Extender: [name], HOYT Weeks in Treatment: 0 Subjective Chief Complaint Information obtained from Patient Sacral pressure ulcer History of Present Illness (HPI) The following HPI elements were documented for the patient's wound: Location: periumbilical area. is on the left of the midline Quality: Patient reports No Pain. Severity: Patient states wound (s) are getting better. Duration: Patient has had the wound for < 2 weeks prior to presenting for treatment Context: The wound appeared gradually over time Modifying Factors: Patient is currently on  renal dialysis and receives treatments 3 times weekly Associated Signs and Symptoms: Patient reports having:then local care with hydrogen peroxide and Bactroban ointment 77 year old gentleman seen earlier this year in January for a abdominal wound is now back with the same problem which has recurred for about 2 weeks and he has been trying to apply some local antibiotic ointment and a Band-Aid there. Of note he has significant pressure in this area due to his beltline and may have put on some  weight which results in his trousers being very tight around the waist. The else has changed in his HandP and his diabetes is pretty well controlled. most recent hemoglobin A1c on 12/06/2016 was 7.8%. he was reviewed by his endocrinologist Dr. Dwyane Dee who made appropriate modifications to his treatment plan. he was recently seen by neurology for mild cognitive impairment that was found to be normal for his age 44/21/18 patient appears to be doing well in regard to his abdominal wound area on evaluation today. He has been tolerating the dressing changes with the [facility] Dressing I'm pleased with how this has progressed and in fact the wound appears to be completely healed on evaluation today. There is definitely no evidence of infection. 03/29/17 Patient's wound on the abdominal region appears to be completely healed on evaluation today. This wound has been doing very well and I think the biggest thing is going to be keeping it from reactivation where it rubbed on his pants. 04/18/2017 -- he was recently discharged about 3 weeks ago and was using a binder which probably caused him abrasion on the area of the recently healed scar. 05/02/17 on evaluation today patient appears to be doing fairly well in regard to his abdominal wound at the site of the surgical scar. Unfortunately the scar tissue is very fragile and tends to reopen. He did get a small abdominal binder to try to pad the area in order to offload and  prevent his pants from rubbing specifically at the site. With that being said he tells me that Dr. Con Memos was concerned that the binder might actually have rub the area causing a reopening. I did have a look at this today and unfortunately it appears that he was putting this over his pants which was making matters worse. 05/09/17 on evaluation today patient continues to do well with the [facility] Dressing. There is a slight skin tear inferior to the wound that we have been treating that may have been due to the dressing sticking slightly. This is minimal however. I did discuss with patient prior to removal of the dressing for dressing changes that they whet this in order to loosen it and not risk damaging in the field. Nonetheless it appears that they have been doing very well with the dressing changes he had his wife took over the dressing changes as she states that he was doing it wrong. Overall though I'm pleased with how things are progressing. No fevers, chills, nausea, or vomiting noted at this time. Readmission: TATENykolas, Bacallao ([id number]) 08/27/17 on evaluation today patient appears to be doing similarly to how he was doing prior to healing previously August 2018 when we previously discharged him. He has fortunately been doing well up until just a couple of weeks ago and he had some of the [facility] Dressing over so he began to treat this on his own. He states that this has gotten much smaller but he ran out of supplies and therefore made an appointment to come back in for evaluation. No fevers, chills, nausea, or vomiting noted at this time. He has no discomfort at this point in regard to the wound. He does tell me that he has one maybe two compression fractures in his lower back and that he is going to be seen by the physician at [facility] imaging, Dr. Estanislado Pandy. ====== Old notes The 77 year old gentleman who has a past medical history of diabetes mellitus, end-stage renal  disease on dialysis, hypertension, secondary hyperparathyroidism, peripheral vascular  disease and history of previous arterial bifemoral bypass graft in 1992 also has a history of gout, hyperlipidemia, hypothyroidism. last hemoglobin A1c was 7.5%. status post AV fistula placement, breast surgery for left granulomatous mastitis, triple a repair in 1992, small bowel obstruction, upper arm angiograms for AV fistula on the left side. was noted to have a superficial skin wound in the periumbilical area since late December 2017. He was asked to clean it with hydrogen peroxide and apply Bactroban. He was seen by vascular surgery in October 2017 where his right ABI was 0.56 left ABI was 0.87 and right TBI was 0.69 and left TBI was 0.70 ===== 09/25/16; patient was previously a patient of Dr. Ardeen Garland who has recurrent skin breakdown in the area of the large incision that was initially 4 apparently an open AAA aneurysm repair. He had secondary surgery but I'm not sure of what etiology this was. He is been using Hydrofera Blue to the open wounds in the mid abdominal surgical incision. This is now closed over. I think he also uses this area as his waist for his pants and I've asked him to adjust this Readmission: 02/11/18 on evaluation today patient presents for evaluation concerning a sacral pressure ulcer which has been present for several weeks. He states that he actually had several areas that were open there appears just be one remaining. He has been tolerating the dressing changes is wise been using Hydrofera Blue Dressing as left over from his abdominal ulcer that we have previously treated. Fortunately there does not appear to be any evidence of infection which is good news. With that being said he states that he's been having issues at one point even at dialysis he had to discontinue dialysis and go home due to the pain. He has not been using any cushion to sit on although he states he does have one  that he can use. No fevers, chills, nausea, or vomiting noted at this time. He had a significant issue with diarrhea prior to this occurring and I believe this may have had some contributing factor as well. Wound History Patient presents with 1 open wound that has been present for approximately 3 or 4 weeks. Patient has been treating wound in the following manner: hydrafera blue. Laboratory tests have not been performed in the last month. Patient reportedly has not tested positive for an antibiotic resistant organism. Patient reportedly has not tested positive for osteomyelitis. Patient reportedly has not had testing performed to evaluate circulation in the legs. Patient History Information obtained from Patient. Allergies penicillin Family History Diabetes - Father,Paternal Grandparents, Heart Disease - Father, Hypertension - Father, No family history of Cancer, Hereditary Spherocytosis, Kidney Disease, Lung Disease, Seizures, Stroke, Thyroid Problems, Tuberculosis. Social History Former smoker - quit in 1996, Marital Status - Married, Alcohol Use - Never - quit in 1976, Drug Use - No History, Caffeine Use - Never. Dingus, CUINN WESTERHOLD ([id number]) Medical History Eyes Denies history of Cataracts, Glaucoma, Optic Neuritis Ear/Nose/Mouth/Throat Denies history of Chronic sinus problems/congestion, Middle ear problems Hematologic/Lymphatic Denies history of Hemophilia, Human Immunodeficiency Virus, Lymphedema, Sickle Cell Disease Cardiovascular Denies history of Angina, Arrhythmia, Congestive Heart Failure, Coronary Artery Disease, Deep Vein Thrombosis, Hypotension, Myocardial Infarction, Peripheral Arterial Disease, Phlebitis, Vasculitis Gastrointestinal Denies history of Cirrhosis , Colitis, Crohn s, Hepatitis A, Hepatitis B, Hepatitis C Endocrine Denies history of Type I Diabetes Genitourinary 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 Immunological Denies history of  Lupus Erythematosus, Raynaud s, Scleroderma  Integumentary (Skin) Denies history of History of Burn, History of pressure wounds Musculoskeletal Denies history of Rheumatoid Arthritis, Osteoarthritis, Osteomyelitis Neurologic Denies history of Dementia, Quadriplegia, Paraplegia, Seizure Disorder Psychiatric Denies history of Anorexia/bulimia, Confinement Anxiety Patient is treated with Insulin. 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 Review of Systems (ROS) Constitutional Symptoms (General Health) The patient has no complaints or symptoms. Eyes Denies complaints or symptoms of Dry Eyes, Vision Changes, Glasses / Contacts. Ear/Nose/Mouth/Throat Denies complaints or symptoms of Difficult clearing ears, Sinusitis. Hematologic/Lymphatic Denies complaints or symptoms of Bleeding / Clotting Disorders, Human Immunodeficiency Virus. Cardiovascular Denies complaints or symptoms of Chest pain, LE edema. Gastrointestinal The patient has no complaints or symptoms. Endocrine Denies complaints or symptoms of Hepatitis, Thyroid disease, Polydypsia (Excessive Thirst). 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 Denies complaints or symptoms of Incontinence/dribbling. Immunological Denies complaints or symptoms of Hives, Itching. Integumentary (Skin) Complains or has symptoms of Wounds. Denies complaints or symptoms of Bleeding or bruising tendency, Breakdown, Swelling. Musculoskeletal Templin, JAMERIUS BOECKMAN ([id number]) Complains or has symptoms of Muscle Weakness. Denies complaints or symptoms of Muscle Pain. Neurologic Denies complaints or symptoms of Numbness/parasthesias, Focal/Weakness. Psychiatric Denies complaints or symptoms of Anxiety, Claustrophobia. Objective Constitutional patient is hypertensive.. pulse regular and within target range for patient.Marland Kitchen  respirations regular, non-labored and within target range for patient.Marland Kitchen temperature within target range for patient.. Well-nourished and well-hydrated in no acute distress. Vitals Time Taken: 8:12 AM, Height: 71 in, Source: Measured, Weight: 177 lbs, Source: Measured, BMI: 24.7, Temperature: 97.9 F, Pulse: 76 bpm, Respiratory Rate: 16 breaths/min, Blood Pressure: 144/90 mmHg. Eyes conjunctiva clear no eyelid edema noted. pupils equal round and reactive to light and accommodation. Ears, Nose, Mouth, and Throat no gross abnormality of ear auricles or external auditory canals. normal hearing noted during conversation. mucus membranes moist. Respiratory normal breathing without difficulty. clear to auscultation bilaterally. Cardiovascular regular rate and rhythm with normal S1, S2. no clubbing, cyanosis, significant edema, Gastrointestinal (GI) soft, non-tender, non-distended, +BS. no ventral hernia noted. Musculoskeletal Patient unable to walk without assistance. Psychiatric this patient is able to make decisions and demonstrates good insight into disease process. Alert and Oriented x 3. pleasant and cooperative. General Notes: On evaluation today patient's wound did show slough noted on the surface of the wound fortunately there does not appear to be any evidence of significant depth to the wound which is good news. Nonetheless I do think that this is something that does require sharp debridement this was discussed with the patient. He did after numbing give permission to proceed with debridement and tolerated this fairly well without significant discomfort which was good news. He did have a little bit of pain but fortunately this was again minimal. Integumentary (Hair, Skin) Wound #5 status is Open. Original cause of wound was Pressure Injury. The wound is located on the Sacrum. The wound measures 0.7cm length x 0.7cm width x 0.1cm depth; 0.385cm^2 area and 0.038cm^3 volume. There is Fat  Layer (Subcutaneous Tissue) Exposed exposed. There is no tunneling or undermining noted. There is a medium amount of serous drainage noted. The wound margin is flat and intact. There is no granulation within the wound bed. There is a large (67-100%) Daniello, GREYDEN BESECKER. ([id number]) amount of necrotic tissue within the wound bed including Adherent Slough. The periwound skin appearance did not exhibit: Callus, Crepitus, Excoriation, Induration, Rash, Scarring, Dry/Scaly, Maceration, Atrophie Blanche, Cyanosis, Ecchymosis, Hemosiderin Staining, Mottled, Pallor, Rubor,  Erythema. Periwound temperature was noted as No Abnormality. The periwound has tenderness on palpation. 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 There was a Excisional Skin/Subcutaneous Tissue Debridement with a total area of 0.49 sq cm performed by STONE III, HOYT E., PA-C. With the following instrument(s): Curette to remove Viable and Non-Viable tissue/material. Material removed includes Subcutaneous Tissue and Slough and after achieving pain control using Other (lidocaine 4%). No specimens were taken. A time out was conducted at 08:49, prior to the start of the procedure. A Minimum amount of bleeding was controlled with Pressure. The procedure was tolerated well with a pain level of 3 throughout and a pain level of 2 following the procedure. Patient s Level of Consciousness post procedure was recorded as Awake and Alert and post-procedure vitals were taken including Temperature: 97.39 F, Pulse: 76 bpm, Respiratory Rate: 16 breaths/min, Blood Pressure: (144)/(90) mmHg. Post Debridement Measurements: 0.7cm length x 0.7cm width x 0.2cm depth; 0.077cm^3 volume. Post debridement Stage noted as  Category/Stage II. Character of Wound/Ulcer Post Debridement is stable. Post procedure Diagnosis Wound #5: Same as Pre-Procedure Plan Wound Cleansing: Wound #5 Sacrum: Clean wound with Normal Saline. Anesthetic (add to Medication List): Wound #5 Sacrum: Topical Lidocaine 4% cream applied to wound bed prior to debridement (In Clinic Only). Benzocaine Topical Anesthetic Spray applied to wound bed prior to debridement (In Clinic Only). Skin Barriers/Peri-Wound Care: Wound #5 Sacrum: Skin Prep Primary Wound Dressing: Wound #5 Sacrum: Silver Collagen - moisten with saline Loux, MARQUISE WICKE ([id number]) Secondary Dressing: Wound #5 Sacrum: Boardered Foam Dressing Dressing Change Frequency: Wound #5 Sacrum: Change Dressing Monday, Wednesday, Friday Follow-up Appointments: Wound #5 Sacrum: Return Appointment in 1 week. Off-Loading: Wound #5 Sacrum: Other: - Take cushion to dialysis for offloading I'm gonna suggest currently that we initiate treatment with [facility] for the next week to see how this does. He's in agreement with plan and his wife will be doing the dressing changes for him. We will see were things stand in one weeks time we see him for reevaluation. I did suggest him having a cushion to take with him to dialysis and possibly even using in his recliner if it appears that that's necessary meaning if when he sits there it hurts and bothers him. He concurs. Please see above for specific wound care orders. We will see patient for re-evaluation in 1 week(s) here in the clinic. If anything worsens or changes patient will contact our office for additional recommendations. Electronic Signature(s) Signed: 02/11/2018 6:53:41 PM By: Worthy Keeler PA-C Entered By: Worthy Keeler on 02/11/2018 09:07:23 Musolino, [name] ([id number]) -------------------------------------------------------------------------------- ROS/PFSH Details Patient Name: [name]. Date of Service: 02/11/2018  8:00 AM Medical Record Number: [id number] Patient Account Number: [id number] Date of Birth/Sex: 11/21/1940 (76 y.o. M) Treating RN: Montey Hora Primary Care Provider: Tedra Senegal Other Clinician: Referring Provider: Referral, Self Treating Provider/Extender: [name], HOYT Weeks in Treatment: 0 Information Obtained From Patient Wound History Do you currently have one or more open woundso Yes How many open wounds do you currently haveo 1 Approximately how long have you had your woundso 3 or 4 weeks How have you been treating your wound(s) until nowo hydrafera blue Has your wound(s) ever healed and then re-openedo No Have you had any lab  work done in the past montho No Have you tested positive for an antibiotic resistant organism (MRSA, VRE)o No Have you tested positive for osteomyelitis (bone infection)o No Have you had any tests for circulation on your legso No Constitutional Symptoms (General Health) Complaints and Symptoms: No Complaints or Symptoms Complaints and Symptoms: Negative for: Fatigue; Fever; Chills; Marked Weight Change Eyes Complaints and Symptoms: Negative for: Dry Eyes; Vision Changes; Glasses / Contacts Medical History: Negative for: Cataracts; Glaucoma; Optic Neuritis Ear/Nose/Mouth/Throat Complaints and Symptoms: Negative for: Difficult clearing ears; Sinusitis Medical History: Negative for: Chronic sinus problems/congestion; Middle ear problems Hematologic/Lymphatic Complaints and Symptoms: Negative for: Bleeding / Clotting Disorders; Human Immunodeficiency Virus Medical History: Positive for: Anemia Negative for: Hemophilia; Human Immunodeficiency Virus; Lymphedema; Sickle Cell Disease Cardiovascular Complaints and Symptoms: Negative for: Chest pain; LE edema Meddaugh, [name] ([id number]) Medical History: Positive for: Hypertension; Peripheral Venous Disease Negative for: Angina; Arrhythmia; Congestive Heart Failure; Coronary Artery Disease; Deep  Vein Thrombosis; Hypotension; Myocardial Infarction; Peripheral Arterial Disease; Phlebitis; Vasculitis Past Medical History Notes: artificial valve, hyperlipidemia Gastrointestinal Complaints and Symptoms: No Complaints or Symptoms Complaints and Symptoms: Negative for: Frequent diarrhea; Nausea; Vomiting Medical History: Negative for: Cirrhosis ; Colitis; Crohnos; Hepatitis A; Hepatitis B; Hepatitis C Past Medical History Notes: scar tissue from multiple surgeries Endocrine Complaints and Symptoms: Negative for: Hepatitis; Thyroid disease; Polydypsia (Excessive Thirst) Medical History: Positive for: Type II Diabetes Negative for: Type I Diabetes Treated with: Insulin Genitourinary Complaints and Symptoms: Positive for: Kidney failure/ Dialysis - ESRD on HD Negative for: Incontinence/dribbling Medical History: 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 Immunological Complaints and Symptoms: Negative for: Hives; Itching Medical History: Negative for: Lupus Erythematosus; Raynaudos; Scleroderma Integumentary (Skin) Complaints and Symptoms: Positive for: Wounds Negative for: Bleeding or bruising tendency; Breakdown; Swelling Medical History: Negative for: History of Burn; History of pressure wounds Musculoskeletal Osmon, [name] ([id number]) Complaints and Symptoms: Positive for: Muscle Weakness Negative for: Muscle Pain Medical History: Positive for: Gout Negative for: Rheumatoid Arthritis; Osteoarthritis; Osteomyelitis Neurologic Complaints and Symptoms: Negative for: Numbness/parasthesias; Focal/Weakness Medical History: Positive for: Neuropathy Negative for: Dementia; Quadriplegia; Paraplegia; Seizure Disorder Psychiatric Complaints and Symptoms: Negative for: Anxiety; Claustrophobia Medical History: Negative for: Anorexia/bulimia; Confinement Anxiety Oncologic Medical History: Negative for: Received Chemotherapy; Received  Radiation Immunizations Pneumococcal Vaccine: Received Pneumococcal Vaccination: Yes Immunization Notes: uo to date Implantable Devices Family and Social History Cancer: No; Diabetes: Yes - Father,Paternal Grandparents; Heart Disease: Yes - Father; Hereditary Spherocytosis: No; Hypertension: Yes - Father; Kidney Disease: No; Lung Disease: No; Seizures: No; Stroke: No; Thyroid Problems: No; Tuberculosis: No; Former smoker - quit in 1996; Marital Status - Married; Alcohol Use: Never - quit in 1976; Drug Use: No History; Caffeine Use: Never; Financial Concerns: No; Food, Clothing or Shelter Needs: No; Support System Lacking: No; Transportation Concerns: No; Advanced Directives: No; Patient does not want information on Advanced Directives Electronic Signature(s) Signed: 02/11/2018 4:45:01 PM By: Montey Hora Signed: 02/11/2018 6:53:41 PM By: Worthy Keeler PA-C Entered By: Montey Hora on 02/11/2018 08:15:35 Dougal, [name] ([id number]) -------------------------------------------------------------------------------- SuperBill Details Patient Name: [name]. Date of Service: 02/11/2018 Medical Record Number: [id number] Patient Account Number: [id number] Date of Birth/Sex: [birth year] (76 y.o. M) Treating RN: Cornell Barman Primary Care Provider: Tedra Senegal Other Clinician: Referring Provider: Referral, Self Treating Provider/Extender: [name], HOYT Weeks in Treatment: 0 Diagnosis Coding ICD-10 Codes Code Description L89.153 Pressure ulcer of sacral region, stage 3 E11.622 Type 2 diabetes mellitus with other skin ulcer Z99.2 Dependence on  renal dialysis Facility Procedures CPT4 Code: [id number] Description: [phone] - WOUND CARE VISIT-LEV 2 EST PT Modifier: Quantity: 1 CPT4 Code: [id number] Description: 76811 - DEB SUBQ TISSUE 20 SQ CM/< ICD-10 Diagnosis Description L89.153 Pressure ulcer of sacral region, stage 3 Modifier: Quantity: 1 Physician Procedures CPT4 Code:  [id number] Description: 55974 - WC PHYS LEVEL 4 - EST PT ICD-10 Diagnosis Description L89.153 Pressure ulcer of sacral region, stage 3 E11.622 Type 2 diabetes mellitus with other skin ulcer Z99.2 Dependence on renal dialysis Modifier: 25 Quantity: 1 CPT4 Code: [id number] Description: 11042 - WC PHYS SUBQ TISS 20 SQ CM ICD-10 Diagnosis Description L89.153 Pressure ulcer of sacral region, stage 3 Modifier: Quantity: 1 Electronic Signature(s) Signed: 02/11/2018 6:53:41 PM By: Worthy Keeler PA-C Entered By: Worthy Keeler on 02/11/2018 09:07:42

## 2018-02-13 ENCOUNTER — Ambulatory Visit: Payer: Medicare Other | Admitting: Endocrinology

## 2018-02-13 ENCOUNTER — Other Ambulatory Visit: Payer: Self-pay

## 2018-02-13 ENCOUNTER — Telehealth: Payer: Self-pay | Admitting: Endocrinology

## 2018-02-13 ENCOUNTER — Other Ambulatory Visit: Payer: Self-pay | Admitting: Endocrinology

## 2018-02-13 DIAGNOSIS — R2681 Unsteadiness on feet: Secondary | ICD-10-CM | POA: Diagnosis not present

## 2018-02-13 DIAGNOSIS — S32009A Unspecified fracture of unspecified lumbar vertebra, initial encounter for closed fracture: Secondary | ICD-10-CM | POA: Diagnosis not present

## 2018-02-13 DIAGNOSIS — M6281 Muscle weakness (generalized): Secondary | ICD-10-CM | POA: Diagnosis not present

## 2018-02-13 DIAGNOSIS — M545 Low back pain: Secondary | ICD-10-CM | POA: Diagnosis not present

## 2018-02-13 MED ORDER — LINAGLIPTIN 5 MG PO TABS: ORAL_TABLET | ORAL | 0 refills | Status: DC

## 2018-02-13 NOTE — Telephone Encounter (Signed)
This medication has been sent to pharmacy listed.

## 2018-02-13 NOTE — Telephone Encounter (Signed)
[facility] [place], [place] AT [address]     linagliptin (TRADJENTA) 5 MG TABS tablet Patient needs this sent into the pharmacy  Thanks!

## 2018-02-13 NOTE — Progress Notes (Addendum)
ERICBERTO, PADGET ([id number]) Visit Report for 02/11/2018 Allergy List Details Patient Name: TATESayf, Kerner. Date of Service: 02/11/2018 8:00 AM Medical Record Number: [id number] Patient Account Number: [id number] Date of Birth/Sex: 09/01/1941 (76 y.o. M) Treating RN: Montey Hora Primary Care Arturo Sofranko: Tedra Senegal Other Clinician: Referring Adeola Dennen: Tedra Senegal Treating Kalynne Womac/Extender: STONE III, HOYT Weeks in Treatment: 0 Allergies Active Allergies penicillin Allergy Notes Electronic Signature(s) Signed: 02/11/2018 4:45:01 PM By: Montey Hora Entered By: Montey Hora on 02/11/2018 08:13:10 Bortle, [name] ([id number]) -------------------------------------------------------------------------------- Arrival Information Details Patient Name: [name]. Date of Service: 02/11/2018 8:00 AM Medical Record Number: [id number] Patient Account Number: [id number] Date of Birth/Sex: Feb 06, 1941 (76 y.o. M) Treating RN: Montey Hora Primary Care Mariyanna Mucha: Tedra Senegal Other Clinician: Referring Tristyn Pharris: Tedra Senegal Treating Tiffiney Sparrow/Extender: [name], HOYT Weeks in Treatment: 0 Visit Information Patient Arrived: Walker Arrival Time: 08:06 Accompanied By: self Transfer Assistance: None Patient Identification Verified: Yes Secondary Verification Process Yes Completed: Patient Has Alerts: Yes Patient Alerts: Patient on Blood Thinner aspirin 650mg  DMII History Since Last Visit Added or deleted any medications: No Any new allergies or adverse reactions: No Had a fall or experienced change in activities of daily living that may affect risk of falls: No Signs or symptoms of abuse/neglect since last visito No Hospitalized since last visit: No Implantable device outside of the clinic excluding cellular tissue based products placed in the center since last visit: No Electronic Signature(s) Signed: 02/11/2018 4:45:01 PM By: Montey Hora Entered By: Montey Hora on  02/11/2018 08:09:47 Rogue, [name] ([id number]) -------------------------------------------------------------------------------- Clinic Level of Care Assessment Details Patient Name: [name]. Date of Service: 02/11/2018 8:00 AM Medical Record Number: [id number] Patient Account Number: [id number] Date of Birth/Sex: [birth year] (76 y.o. M) Treating RN: Cornell Barman Primary Care Kyle Stansell: Tedra Senegal Other Clinician: Referring Maty Zeisler: Tedra Senegal Treating Carlisle Torgeson/Extender: [name], HOYT Weeks in Treatment: 0 Clinic Level of Care Assessment Items TOOL 1 Quantity Score []  - Use when EandM and Procedure is performed on INITIAL visit 0 ASSESSMENTS - Nursing Assessment / Reassessment X - General Physical Exam (combine w/ comprehensive assessment (listed just below) when 1 20 performed on new pt. evals) X- 1 25 Comprehensive Assessment (HX, ROS, Risk Assessments, Wounds Hx, etc.) ASSESSMENTS - Wound and Skin Assessment / Reassessment []  - Dermatologic / Skin Assessment (not related to wound area) 0 ASSESSMENTS - Ostomy and/or Continence Assessment and Care []  - Incontinence Assessment and Management 0 []  - 0 Ostomy Care Assessment and Management (repouching, etc.) PROCESS - Coordination of Care X - Simple Patient / Family Education for ongoing care 1 15 []  - 0 Complex (extensive) Patient / Family Education for ongoing care []  - 0 Staff obtains Programmer, systems, Records, Test Results / Process Orders []  - 0 Staff telephones HHA, Nursing Homes / Clarify orders / etc []  - 0 Routine Transfer to another Facility (non-emergent condition) []  - 0 Routine Hospital Admission (non-emergent condition) X- 1 15 New Admissions / Biomedical engineer / Ordering NPWT, Apligraf, etc. []  - 0 Emergency Hospital Admission (emergent condition) PROCESS - Special Needs []  - Pediatric / Minor Patient Management 0 []  - 0 Isolation Patient Management []  - 0 Hearing / Language / Visual special  needs []  - 0 Assessment of Community assistance (transportation, D/C planning, etc.) []  - 0 Additional assistance / Altered mentation []  - 0 Support Surface(s) Assessment (bed, cushion, seat, etc.) Hansman, [name] ([id number]) INTERVENTIONS - Miscellaneous []  - External ear exam 0 []  - 0 Patient Transfer (multiple staff /  Hoyer Lift / Similar devices) []  - 0 Simple Staple / Suture removal (25 or less) []  - 0 Complex Staple / Suture removal (26 or more) []  - 0 Hypo/Hyperglycemic Management (do not check if billed separately) []  - 0 Ankle / Brachial Index (ABI) - do not check if billed separately Has the patient been seen at the hospital within the last three years: Yes Total Score: 75 Level Of Care: New/Established - Level 2 Electronic Signature(s) Signed: 02/11/2018 5:25:47 PM By: Gretta Cool, BSN, RN, CWS, Kim RN, BSN Entered By: Gretta Cool, BSN, RN, CWS, Kim on 02/11/2018 08:54:43 Carlini, [name] ([id number]) -------------------------------------------------------------------------------- Encounter Discharge Information Details Patient Name: [name]. Date of Service: 02/11/2018 8:00 AM Medical Record Number: [id number] Patient Account Number: [id number] Date of Birth/Sex: [birth year] (76 y.o. M) Treating RN: Roger Shelter Primary Care Daaron Dimarco: Tedra Senegal Other Clinician: Referring Allaina Brotzman: Tedra Senegal Treating Calyx Hawker/Extender: [name], HOYT Weeks in Treatment: 0 Encounter Discharge Information Items Discharge Condition: Stable Ambulatory Status: Walker Discharge Destination: Home Transportation: Private Auto Schedule Follow-up Appointment: Yes Clinical Summary of Care: Electronic Signature(s) Signed: 02/12/2018 7:52:46 AM By: Roger Shelter Entered By: Roger Shelter on 02/11/2018 09:05:51 Barnaby, [name] ([id number]) -------------------------------------------------------------------------------- Lower Extremity Assessment Details Patient Name: [name]. Date of Service: 02/11/2018 8:00 AM Medical Record Number: [id number] Patient Account Number: [id number] Date of Birth/Sex: 01/15/1941 (76 y.o. M) Treating RN: Montey Hora Primary Care Asher Babilonia: Tedra Senegal Other Clinician: Referring Leoma Folds: Tedra Senegal Treating Naiara Lombardozzi/Extender: [name], HOYT Weeks in Treatment: 0 Electronic Signature(s) Signed: 02/11/2018 4:45:01 PM By: Montey Hora Entered By: Montey Hora on 02/11/2018 08:18:18 Mcreynolds, [name] ([id number]) -------------------------------------------------------------------------------- Multi Wound Chart Details Patient Name: [name]. Date of Service: 02/11/2018 8:00 AM Medical Record Number: [id number] Patient Account Number: [id number] Date of Birth/Sex: 07-Mar-1941 (76 y.o. 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] [N/A:N/A] Comorbid History: [5:Anemia, Hypertension, Peripheral Venous Disease, Type II Diabetes, End Stage Renal Disease, Gout, Neuropathy] [N/A:N/A] Date Acquired: [5:01/21/2018] [N/A:N/A] Weeks of Treatment: [5:0] [N/A:N/A] Wound Status: [5:Open] [N/A:N/A] Measurements L x W x D [5:0.7x0.7x0.1] [N/A:N/A] (cm) Area (cm) : [5:0.385] [N/A:N/A] Volume (cm) : [5:0.038] [N/A:N/A] Classification: [5:Category/Stage II] [N/A:N/A] Exudate Amount: [5:Medium] [N/A:N/A] Exudate Type: [5:Serous] [N/A:N/A] Exudate Color: [5:amber] [N/A:N/A] Wound Margin: [5:Flat and Intact] [N/A:N/A] Granulation Amount: [5:None Present (0%)]  [N/A:N/A] Necrotic Amount: [5:Large (67-100%)] [N/A:N/A] Exposed Structures: [5:Fat Layer (Subcutaneous Tissue) Exposed: Yes Fascia: No Tendon: No Muscle: No Joint: No Bone: No] [N/A:N/A] Epithelialization: [5:None] [N/A:N/A] Periwound Skin Texture: [5:Excoriation: No Induration: No Callus: No Crepitus: No Rash: No Scarring: No] [N/A:N/A] Periwound Skin Moisture: Maceration: No N/A N/A Dry/Scaly: No Periwound Skin Color: Atrophie Blanche: No N/A N/A Cyanosis: No Ecchymosis: No Erythema: No Hemosiderin Staining: No Mottled: No Pallor: No Rubor: No Temperature: No Abnormality N/A N/A Tenderness on Palpation: Yes N/A N/A Wound Preparation: Ulcer Cleansing: N/A N/A Rinsed/Irrigated with Saline Topical Anesthetic Applied: Other: lidocaine 4% Treatment Notes Electronic Signature(s) Signed: 02/11/2018 5:25:47 PM By: Gretta Cool, BSN, RN, CWS, Kim RN, BSN Entered By: Gretta Cool, BSN, RN, CWS, Kim on 02/11/2018 08:48:09 Schachter, [name] ([id number]) -------------------------------------------------------------------------------- [place] Details Patient Name: [name]. Date of Service: 02/11/2018 8:00 AM Medical  Record Number: [id number] Patient Account Number: [id number] Date of Birth/Sex: December 09, 1940 (76 y.o. M) Treating RN: Cornell Barman Primary Care Tashi Andujo: Tedra Senegal Other Clinician: Referring Mayreli Alden: Tedra Senegal Treating Jordi Kamm/Extender: [name], HOYT Weeks in Treatment: 0 Active Inactive ` Orientation to the Wound Care Program Nursing Diagnoses: Knowledge deficit related to the wound healing center program Goals: Patient/caregiver will verbalize understanding of the [facility] Date Initiated: 02/11/2018 Target Resolution Date: 02/21/2018 Goal Status: Active Interventions: Provide education on orientation to the wound center Notes: ` Pressure Nursing Diagnoses: Knowledge deficit related to management of pressures  ulcers Goals: Patient will remain free from development of additional pressure ulcers Date Initiated: 02/11/2018 Target Resolution Date: 02/27/2018 Goal Status: Active Interventions: Assess: immobility, friction, shearing, incontinence upon admission and as needed Treatment Activities: Patient referred for home evaluation of offloading devices/mattresses : 02/11/2018 Notes: ` Wound/Skin Impairment Nursing Diagnoses: Impaired tissue integrity Goals: Ulcer/skin breakdown will have a volume reduction of 80% by week 12 Date Initiated: 02/11/2018 Target Resolution Date: 05/14/2018 LYNK, MARTI ([id number]) Goal Status: Active Interventions: Assess ulceration(s) every visit Notes: Electronic Signature(s) Signed: 02/11/2018 5:25:47 PM By: Gretta Cool, BSN, RN, CWS, Kim RN, BSN Entered By: Gretta Cool, BSN, RN, CWS, Kim on 02/11/2018 08:47:34 Lukic, [name] ([id number]) -------------------------------------------------------------------------------- Pain Assessment Details Patient Name: [name]. Date of Service: 02/11/2018 8:00 AM Medical Record Number: [id number] Patient Account Number: [id number] Date of Birth/Sex: 11/10/1940 (76 y.o. M) Treating RN: Montey Hora Primary Care Shuntel Fishburn: Tedra Senegal Other Clinician: Referring Archana Eckman: Tedra Senegal Treating Bev Drennen/Extender: [name], HOYT Weeks in Treatment: 0 Active Problems Location of Pain Severity and Description of Pain Patient Has Paino Yes Site Locations Pain Location: Pain in Ulcers Pain Management and Medication Current Pain Management: Electronic Signature(s) Signed: 02/11/2018 4:45:01 PM By: Montey Hora Entered By: Montey Hora on 02/11/2018 08:12:15 Bratton, [name] ([id number]) -------------------------------------------------------------------------------- Patient/Caregiver Education Details Patient Name: [name]. Date of Service: 02/11/2018 8:00 AM Medical Record Number: [id number] Patient Account  Number: [id number] Date of Birth/Gender: 03/02/1941 (76 y.o. M) Treating RN: Roger Shelter Primary Care Physician: Tedra Senegal Other Clinician: Referring Physician: Tedra Senegal Treating Physician/Extender: Sharalyn Ink in Treatment: 0 Education Assessment Education Provided To: Patient Education Topics Provided Wound Debridement: Handouts: Wound Debridement Methods: Explain/Verbal Responses: State content correctly Wound/Skin Impairment: Handouts: Caring for Your Ulcer Methods: Explain/Verbal Responses: State content correctly Electronic Signature(s) Signed: 02/12/2018 7:52:46 AM By: Roger Shelter Entered By: Roger Shelter on 02/11/2018 09:06:10 Masek, [name] ([id number]) -------------------------------------------------------------------------------- Wound Assessment Details Patient Name: [name]. Date of Service: 02/11/2018 8:00 AM Medical Record Number: [id number] Patient Account Number: [id number] Date of Birth/Sex: [birth year] (76 y.o. M) Treating RN: Montey Hora Primary Care Erilyn Pearman: Tedra Senegal Other Clinician: Referring Twania Bujak: Tedra Senegal Treating Manoj Enriquez/Extender: [name], HOYT Weeks in Treatment: 0 Wound Status Wound Number: 5 Primary Pressure Ulcer Etiology: Wound Location: Sacrum Wound Open Wounding Event: Pressure Injury Status: Date Acquired: 01/21/2018 Comorbid Anemia, Hypertension, Peripheral Venous Weeks Of Treatment: 0 History: Disease, Type II Diabetes, End Stage Renal Clustered Wound: No Disease, Gout, Neuropathy Wound Measurements Length: (cm) 0.7 Width: (cm) 0.7 Depth: (cm) 0.1 Area: (cm) 0.385 Volume: (cm) 0.038 % Reduction in Area: 0% % Reduction in Volume: 0% Epithelialization: None Tunneling: No Undermining: No Wound Description Classification: Category/Stage III Foul Od Wound Margin: Flat and Intact Slough/ Exudate Amount: Medium Exudate Type: Serous Exudate Color: amber or After  Cleansing: No Fibrino Yes Wound Bed Granulation Amount: None Present (0%) Exposed Structure Necrotic Amount: Large (67-100%)  Fascia Exposed: No Necrotic Quality: Adherent Slough Fat Layer (Subcutaneous Tissue) Exposed: Yes Tendon Exposed: No Muscle Exposed: No Joint Exposed: No Bone Exposed: No Periwound Skin Texture Texture Color No Abnormalities Noted: No No Abnormalities Noted: No Callus: No Atrophie Blanche: No Crepitus: No Cyanosis: No Excoriation: No Ecchymosis: No Induration: No Erythema: No Rash: No Hemosiderin Staining: No Scarring: No Mottled: No Pallor: No Moisture Rubor: No No Abnormalities Noted: No Dry / Scaly: No Temperature / Pain Maceration: No Temperature: No Abnormality Cragin, [name] ([id number]) Tenderness on Palpation: Yes Wound Preparation Ulcer Cleansing: Rinsed/Irrigated with Saline Topical Anesthetic Applied: Other: lidocaine 4%, Treatment Notes Wound #5 (Sacrum) 1. Cleansed with: Clean wound with Normal Saline 2. Anesthetic Topical Lidocaine 4% cream to wound bed prior to debridement 3. Peri-wound Care: Skin Prep 4. Dressing Applied: Prisma Ag 5. Secondary Dressing Applied Bordered Foam Dressing Electronic Signature(s) Signed: 02/18/2018 3:24:07 PM By: Montey Hora Previous Signature: 02/11/2018 4:45:01 PM Version By: Montey Hora Previous Signature: 02/11/2018 5:25:47 PM Version By: Gretta Cool, BSN, RN, CWS, Kim RN, BSN Entered By: Montey Hora on 02/18/2018 15:24:06 Melnik, [name] ([id number]) -------------------------------------------------------------------------------- [place] Details Patient Name: [name]. Date of Service: 02/11/2018 8:00 AM Medical Record Number: [id number] Patient Account Number: [id number] Date of Birth/Sex: [birth year] (76 y.o. M) Treating RN: Montey Hora Primary Care Rickayla Wieland: Tedra Senegal Other Clinician: Referring Jaqualin Serpa: Tedra Senegal Treating Tavi Hoogendoorn/Extender: [name], HOYT Weeks  in Treatment: 0 Vital Signs Time Taken: 08:12 Temperature (F): 97.9 Height (in): 71 Pulse (bpm): 76 Source: Measured Respiratory Rate (breaths/min): 16 Weight (lbs): 177 Blood Pressure (mmHg): 144/90 Source: Measured Reference Range: 80 - 120 mg / dl Body Mass Index (BMI): 24.7 Electronic Signature(s) Signed: 02/11/2018 4:45:01 PM By: Montey Hora Entered By: Montey Hora on 02/11/2018 08:13:03

## 2018-02-14 DIAGNOSIS — N186 End stage renal disease: Secondary | ICD-10-CM | POA: Diagnosis not present

## 2018-02-14 DIAGNOSIS — E119 Type 2 diabetes mellitus without complications: Secondary | ICD-10-CM | POA: Diagnosis not present

## 2018-02-14 DIAGNOSIS — N2581 Secondary hyperparathyroidism of renal origin: Secondary | ICD-10-CM | POA: Diagnosis not present

## 2018-02-14 DIAGNOSIS — D631 Anemia in chronic kidney disease: Secondary | ICD-10-CM | POA: Diagnosis not present

## 2018-02-14 DIAGNOSIS — E1129 Type 2 diabetes mellitus with other diabetic kidney complication: Secondary | ICD-10-CM | POA: Diagnosis not present

## 2018-02-17 DIAGNOSIS — N186 End stage renal disease: Secondary | ICD-10-CM | POA: Diagnosis not present

## 2018-02-17 DIAGNOSIS — D631 Anemia in chronic kidney disease: Secondary | ICD-10-CM | POA: Diagnosis not present

## 2018-02-17 DIAGNOSIS — E1129 Type 2 diabetes mellitus with other diabetic kidney complication: Secondary | ICD-10-CM | POA: Diagnosis not present

## 2018-02-17 DIAGNOSIS — N2581 Secondary hyperparathyroidism of renal origin: Secondary | ICD-10-CM | POA: Diagnosis not present

## 2018-02-17 DIAGNOSIS — E119 Type 2 diabetes mellitus without complications: Secondary | ICD-10-CM | POA: Diagnosis not present

## 2018-02-17 NOTE — Patient Instructions (Addendum)
Physical therapy ordered at local facility.  Return to wound care center for treatment of sacral decubiti.  Dr. Lorrene Reid will be seeing you in June.  You have appointments later this month for Prolia and to see Dr. Dwyane Dee.

## 2018-02-18 ENCOUNTER — Other Ambulatory Visit: Payer: Medicare Other

## 2018-02-18 ENCOUNTER — Other Ambulatory Visit: Payer: Self-pay | Admitting: Endocrinology

## 2018-02-18 ENCOUNTER — Ambulatory Visit (INDEPENDENT_AMBULATORY_CARE_PROVIDER_SITE_OTHER): Payer: Medicare Other

## 2018-02-18 ENCOUNTER — Encounter: Payer: Medicare Other | Admitting: Physician Assistant

## 2018-02-18 DIAGNOSIS — N186 End stage renal disease: Secondary | ICD-10-CM | POA: Diagnosis not present

## 2018-02-18 DIAGNOSIS — Z87891 Personal history of nicotine dependence: Secondary | ICD-10-CM | POA: Diagnosis not present

## 2018-02-18 DIAGNOSIS — L89153 Pressure ulcer of sacral region, stage 3: Secondary | ICD-10-CM | POA: Diagnosis not present

## 2018-02-18 DIAGNOSIS — E1151 Type 2 diabetes mellitus with diabetic peripheral angiopathy without gangrene: Secondary | ICD-10-CM | POA: Diagnosis not present

## 2018-02-18 DIAGNOSIS — E1122 Type 2 diabetes mellitus with diabetic chronic kidney disease: Secondary | ICD-10-CM | POA: Diagnosis not present

## 2018-02-18 DIAGNOSIS — M8000XD Age-related osteoporosis with current pathological fracture, unspecified site, subsequent encounter for fracture with routine healing: Secondary | ICD-10-CM

## 2018-02-18 DIAGNOSIS — Z992 Dependence on renal dialysis: Secondary | ICD-10-CM | POA: Diagnosis not present

## 2018-02-18 LAB — GLUCOSE, CAPILLARY: Glucose-Capillary: 212 mg/dL — ABNORMAL HIGH (ref 65–99)

## 2018-02-18 MED ORDER — DENOSUMAB 60 MG/ML ~~LOC~~ SOSY
60.0000 mg | PREFILLED_SYRINGE | Freq: Once | SUBCUTANEOUS | Status: AC
  Administered 2018-02-18: 60 mg via SUBCUTANEOUS

## 2018-02-19 DIAGNOSIS — D631 Anemia in chronic kidney disease: Secondary | ICD-10-CM | POA: Diagnosis not present

## 2018-02-19 DIAGNOSIS — E119 Type 2 diabetes mellitus without complications: Secondary | ICD-10-CM | POA: Diagnosis not present

## 2018-02-19 DIAGNOSIS — N186 End stage renal disease: Secondary | ICD-10-CM | POA: Diagnosis not present

## 2018-02-19 DIAGNOSIS — E1129 Type 2 diabetes mellitus with other diabetic kidney complication: Secondary | ICD-10-CM | POA: Diagnosis not present

## 2018-02-19 DIAGNOSIS — N2581 Secondary hyperparathyroidism of renal origin: Secondary | ICD-10-CM | POA: Diagnosis not present

## 2018-02-20 ENCOUNTER — Encounter (HOSPITAL_BASED_OUTPATIENT_CLINIC_OR_DEPARTMENT_OTHER): Payer: Medicare Other

## 2018-02-20 ENCOUNTER — Ambulatory Visit (INDEPENDENT_AMBULATORY_CARE_PROVIDER_SITE_OTHER): Payer: Medicare Other | Admitting: Endocrinology

## 2018-02-20 ENCOUNTER — Encounter: Payer: Self-pay | Admitting: Endocrinology

## 2018-02-20 VITALS — BP 102/60 | HR 80 | Ht 71.0 in | Wt 158.8 lb

## 2018-02-20 DIAGNOSIS — E1165 Type 2 diabetes mellitus with hyperglycemia: Secondary | ICD-10-CM | POA: Diagnosis not present

## 2018-02-20 DIAGNOSIS — R634 Abnormal weight loss: Secondary | ICD-10-CM

## 2018-02-20 DIAGNOSIS — Z794 Long term (current) use of insulin: Secondary | ICD-10-CM

## 2018-02-20 LAB — GLUCOSE, RANDOM: Glucose, Bld: 151 mg/dL — ABNORMAL HIGH (ref 70–99)

## 2018-02-20 LAB — TSH: TSH: 0.9 u[IU]/mL (ref 0.35–4.50)

## 2018-02-20 MED ORDER — LINAGLIPTIN 5 MG PO TABS: ORAL_TABLET | ORAL | 1 refills | Status: DC

## 2018-02-20 NOTE — Progress Notes (Signed)
Patient ID: [name], male   DOB: 07/27/1941, 77 y.o.   MRN: [id number]           Reason for Appointment:  Follow-up for endocrinology  Referring physician: [place]  History of Present Illness:          Date of diagnosis of type 2 diabetes mellitus:  1996      Background history:  He has had long-standing diabetes probably treated with metformin initially and subsequently with sulfonylurea drugs At some point he was also given Actos in addition to his glipizide which he is still taking Records of his control are only available for the last 4 years Over the last few years his A1c has been generally in the 6-7% range His A1c was 6.2 in May 2016 and apparently was starting to get low normal blood sugars at times He was admitted to the hospital for fluid overload and renal failure and at that time his Actos was stopped Subsequently his blood sugars had been markedly increased He was switched from regular insulin to NovoLog on his initial consultation  Recent history:    INSULIN regimen is described as: None, previously on Lantus 13 units in am; 10--? 5--10  units Novolog with meals with syringe  Oral hypoglycemic drugs: Tradjenta 5 mg daily  His A1c is last  at 5.4, has been as high as 8.6 in the past  Dialysis is done at 7 am, doing it on Monday/Wednesday/Fridays     Current blood sugar patterns and problems identified:  He has been off insulin since late April because of hypoglycemia  With readings being mildly increased subsequently he has been on Tradjenta 5 mg daily  However he continues to have weight loss and appears not to be eating as much as before  However does usually have breakfast with the help of his family and this is a balanced meal with eggs, toast and a slice of bacon  Highest blood sugars are probably after breakfast, highest 192  However has not had any other readings over 180  With his infrequent monitoring difficult to get a blood sugar pattern  but lowest glucose 106 a couple of weeks ago  Lab glucose was over 200 but apparently his family member gave him some sweets thinking his sugar was low but was having nonspecific symptoms went away from home   Oral hypoglycemic drugs the patient is taking are: None Side effects from medications have been: None  Compliance with the medical regimen: Good   Glucose monitoring:  done 0-1  times a day         Glucometer:  Freestyle  Blood Glucose readings by review of monitor: Download  Glucose range: 106-192 with average 140, has only 9 readings in the last couple of weeks    Self-care: The diet that the patient has been following is: tries to limit sweets, portions. eating out only about once a week at a steak house        Typical meal intake: Breakfast is variable; supper 8 pm            Dietician visit, most recent: 08/2017               Exercise:  Not able to do any, in wheelchair  Weight history:  Wt Readings from Last 3 Encounters:  02/20/18 158 lb 12.8 oz (72 kg)  02/04/18 167 lb (75.8 kg)  01/21/18 171 lb (77.6 kg)    Glycemic control:    Lab  Results  Component Value Date   HGBA1C 5.4 01/18/2018   HGBA1C 6.2 01/07/2018   HGBA1C 6.2 (H) 11/07/2017   Lab Results  Component Value Date   MICROALBUR 37.2 09/03/2016   LDLCALC 21 09/05/2017   CREATININE 4.48 (H) 01/23/2018    Lab Results  Component Value Date   FRUCTOSAMINE 454 (H) 07/12/2017   FRUCTOSAMINE 355 (H) 10/08/2016   FRUCTOSAMINE 407 (H) 08/29/2016   PROBLEM 2 Osteoporosis: See review of systems       Allergies as of 02/20/2018      Reactions   Penicillins Rash   Has patient had a PCN reaction causing immediate rash, facial/tongue/throat swelling, SOB or lightheadedness with hypotension: Yes Has patient had a PCN reaction causing severe rash involving mucus membranes or skin necrosis: Yes Has patient had a PCN reaction that required hospitalization: No Has patient had a PCN reaction occurring  within the last 10 years: No If all of the above answers are "NO", then may proceed with Cephalosporin use.      Medication List        Accurate as of 02/20/18 10:02 AM. Always use your most recent med list.          acetaminophen 500 MG tablet Commonly known as:  TYLENOL Take 1,000 mg by mouth 2 (two) times daily as needed (for pain).   allopurinol 100 MG tablet Commonly known as:  ZYLOPRIM Take 100 mg by mouth daily as needed (pain).   aspirin 325 MG tablet Take 325 mg by mouth daily.   clobetasol 0.05 % external solution Commonly known as:  TEMOVATE apply to affected area ON THE SCALP UP TO 2 TIMES A DAY AS NEEDED...  (REFER TO PRESCRIPTION NOTES).   docusate sodium 100 MG capsule Commonly known as:  COLACE Take 1 capsule (100 mg total) by mouth daily. To avoid constipation from pain medication   donepezil 10 MG tablet Commonly known as:  ARICEPT Take 1 tablet (10 mg total) by mouth at bedtime.   Fluocinolone Acetonide Scalp 0.01 % Oil apply to affected area ON SCALP EVERY NIGHT AT BEDTIME, WASH UPON AWAKENING   Fluocinolone Acetonide Body 0.01 % Oil Apply 1 ml on the skin daily   fluticasone 0.05 % cream Commonly known as:  CUTIVATE apply to affected area ON FACE UP TO 2 TIMES A DAY AS NEEDED   fluticasone 50 MCG/ACT nasal spray Commonly known as:  FLONASE Place 1 spray into both nostrils daily.   FREESTYLE FREEDOM LITE w/Device Kit Use to check blood sugar 2 times per day dx code E11.65   freestyle lancets Use as instructed to check blood sugar 2 times per day dx code E11.65   glucose blood test strip Commonly known as:  ONETOUCH VERIO Use to check blood sugar 2 times per day. Dx code E11.65   glucose blood test strip Commonly known as:  FREESTYLE LITE Use as instructed   Insulin Pen Needle 30G X 8 MM Misc Commonly known as:  NOVOFINE Use to inject insulin 3 times daily   ketoconazole 2 % shampoo Commonly known as:  NIZORAL Apply 1 application  topically 2 (two) times a week.   levothyroxine 50 MCG tablet Commonly known as:  SYNTHROID, LEVOTHROID Take 1 tablet (50 mcg total) by mouth daily.   lidocaine-prilocaine cream Commonly known as:  EMLA Apply 1 application topically every Monday, Wednesday, and Friday. AS DIRECTED   linagliptin 5 MG Tabs tablet Commonly known as:  TRADJENTA TAKE 1 TABLET(5 MG)  BY MOUTH DAILY   loratadine 10 MG tablet Commonly known as:  CLARITIN Take 10 mg by mouth daily as needed for allergies.   methocarbamol 500 MG tablet Commonly known as:  ROBAXIN Take 1 tablet (500 mg total) by mouth at bedtime as needed for muscle spasms.   metroNIDAZOLE 0.75 % gel Commonly known as:  METROGEL APPLY TO AFFECTED AREA ON SKIN  ON FACE TWICE DAILY   midodrine 10 MG tablet Commonly known as:  PROAMATINE Take 10 mg by mouth 3 (three) times daily.   mupirocin ointment 2 % Commonly known as:  BACTROBAN Use as directed to affected areas   Naftifine HCl 2 % Crea Commonly known as:  NAFTIN Apply to feet daily for fungal infection   nystatin-triamcinolone cream Commonly known as:  MYCOLOG II Apply 1 application topically 2 (two) times daily.   omeprazole 40 MG capsule Commonly known as:  PRILOSEC Take 1 capsule (40 mg total) by mouth daily.   ondansetron 4 MG disintegrating tablet Commonly known as:  ZOFRAN ODT Take 1 tablet (4 mg total) by mouth every 8 (eight) hours as needed for nausea or vomiting.   PROBIOTIC PO Take 1 tablet by mouth daily.   RENVELA 800 MG tablet Generic drug:  sevelamer carbonate Take 800-1,600 mg by mouth See admin instructions. Take 1 tablet with breakfast and at lunch then take 2 tablets at dinner and 800 mg with each snack   simvastatin 20 MG tablet Commonly known as:  ZOCOR Take 20 mg by mouth at bedtime.   Teriparatide (Recombinant) 600 MCG/2.4ML Soln Commonly known as:  FORTEO Inject 0.08 mLs (20 mcg total) into the skin daily.   triamcinolone ointment 0.1  % Commonly known as:  KENALOG Apply 1 application topically 2 (two) times daily. Use between thighs       Allergies:  Allergies  Allergen Reactions  . Penicillins Rash    Has patient had a PCN reaction causing immediate rash, facial/tongue/throat swelling, SOB or lightheadedness with hypotension: Yes Has patient had a PCN reaction causing severe rash involving mucus membranes or skin necrosis: Yes Has patient had a PCN reaction that required hospitalization: No Has patient had a PCN reaction occurring within the last 10 years: No If all of the above answers are "NO", then may proceed with Cephalosporin use.     Past Medical History:  Diagnosis Date  . Allergy   . Anemia   . Arthritis   . Cataract    bil cateracts removed  . Coronary artery disease   . Diabetes mellitus    Type 2  . Diverticulitis   . ED (erectile dysfunction)   . 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 ESRD (end stage renal disease) on dialysis ([place]) 03/2015  . GERD (gastroesophageal reflux disease)    pepto   . Gout   . Hyperlipidemia   . Hypertension   . Hypothyroidism   . Pneumonia   . PVD (peripheral vascular disease) ([place])    has plastic aorta  . Renal insufficiency   . Seasonal allergies   . Shortness of breath dyspnea   . Sleep apnea    does not wear c-pap  . Thyroid disease     Past Surgical History:  Procedure Laterality Date  . aortobifemoral bypass    . AV FISTULA PLACEMENT Left 12/01/2013   Procedure: ARTERIOVENOUS (AV) FISTULA CREATION- LEFT BRACHIOCEPHALIC;  Surgeon: Angelia Mould, MD;  Location: [place];  Service: Vascular;  Laterality: Left;  . [place]  TRANSPOSITION Right 07/27/2014   Procedure: BASCILIC VEIN TRANSPOSITION;  Surgeon: Angelia Mould, MD;  Location: [facility];  Service: Vascular;  Laterality: Right;  . BREAST SURGERY     left - granulomatous mastitis  . COLONOSCOPY    . 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 EYE SURGERY Bilateral     cataracts  . HEMODIALYSIS INPATIENT  01/17/2018      . REVISON OF ARTERIOVENOUS FISTULA Left 02/09/2014   Procedure: REVISON OF LEFT ARTERIOVENOUS FISTULA - RESECTION OF RENDUNDANT VEIN;  Surgeon: Angelia Mould, MD;  Location: [facility];  Service: Vascular;  Laterality: Left;  . SBO with lysis adhesions    . SHUNTOGRAM Left 04/19/2014   Procedure: FISTULOGRAM;  Surgeon: Angelia Mould, MD;  Location: [facility] CATH LAB;  Service: Cardiovascular;  Laterality: Left;  . UNILATERAL UPPER EXTREMEITY ANGIOGRAM N/A 07/12/2014   Procedure: UNILATERAL UPPER Anselmo Rod;  Surgeon: Angelia Mould, MD;  Location: [facility] CATH LAB;  Service: Cardiovascular;  Laterality: N/A;    Family History  Problem Relation Age of Onset  . Aneurysm Mother   . Heart disease Father   . Stroke Father   . Hypertension Father   . Diabetes Father   . Dementia Neg Hx   . Colon cancer Neg Hx   . Esophageal cancer Neg Hx   . Pancreatic cancer Neg Hx   . Prostate cancer Neg Hx   . Rectal cancer Neg Hx   . Stomach cancer Neg Hx     Social History:  reports that he quit smoking about 23 years ago. He has never used smokeless tobacco. He reports that he does not drink alcohol or use drugs.    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 he is not a candidate for Forteo and nephrologist suggested Prolia He has had his first injection of Prolia this month and has had no side effects so far  He is reportedly taking Sensipar for secondary hyperparathyroidism, followed by nephrologist  Lab Results  Component Value Date   PTH 154 (H) 10/06/2014   CALCIUM 8.8 (L) 01/23/2018   CAION 1.44 (H) 07/12/2014   PHOS 4.2 01/10/2016     Lipid history: He has been treated with Simvastatin 20 mg  by PCP, previously on Atorvastatin     Lab Results  Component Value Date   CHOL 79 09/05/2017   HDL 38.00 (L) 09/05/2017   LDLCALC 21 09/05/2017   TRIG 99.0 09/05/2017   CHOLHDL 2 09/05/2017          He has ESRD on dialysis    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  Mild hypothyroidism: On 50 mcg levothyroxine, no recent labs available  Lab Results  Component Value Date   TSH 1.92 07/12/2017       Physical Examination:  BP 102/60 (BP Location: Left Arm, Patient Position: Sitting, Cuff Size: Normal)   Pulse 80   Ht 5' 11"  (1.803 m)   Wt 158 lb 12.8 oz (72 kg)   SpO2 99%   BMI 22.15 kg/m       ASSESSMENT:  Diabetes type 2 currently not on insulin  See history of present illness for detailed discussion of his current management, blood sugar patterns  and problems identified  His blood sugars are reasonably well controlled considering his various diagnoses, age and duration of diabetes Highest blood sugar recently 192 and with infrequent monitoring blood sugars have not been consistently high even in the evening after supper Doing well with Tradjenta and he would like to get [facility] from [facility] for lower cost, this prescription was given  OSTEOPOROSIS with multiple vertebral fractures and T score of -4.6: Has started treatment with Prolia and will continue indefinitely  Weight loss, decreased appetite and increased satiety: Recommend that he discuss this with his gastroenterologist and PCP again   PLAN:  Continue Tradjenta  Call if blood sugars are unusually high  Discussed that since he is not on insulin he should not have any hypoglycemia and if he has any suggestive symptoms to check his blood sugar first before treating  TSH to be checked for his hypothyroidism especially with his weight loss  There are no Patient Instructions on file for this visit.        Elayne Snare 02/20/2018, 10:02 AM   Note: This office note was prepared with Dragon voice  recognition system technology. Any transcriptional errors that result from this process are unintentional.

## 2018-02-21 ENCOUNTER — Other Ambulatory Visit: Payer: Self-pay

## 2018-02-21 ENCOUNTER — Telehealth: Payer: Self-pay | Admitting: Endocrinology

## 2018-02-21 DIAGNOSIS — N2581 Secondary hyperparathyroidism of renal origin: Secondary | ICD-10-CM | POA: Diagnosis not present

## 2018-02-21 DIAGNOSIS — E119 Type 2 diabetes mellitus without complications: Secondary | ICD-10-CM | POA: Diagnosis not present

## 2018-02-21 DIAGNOSIS — E1129 Type 2 diabetes mellitus with other diabetic kidney complication: Secondary | ICD-10-CM | POA: Diagnosis not present

## 2018-02-21 DIAGNOSIS — N186 End stage renal disease: Secondary | ICD-10-CM | POA: Diagnosis not present

## 2018-02-21 DIAGNOSIS — D631 Anemia in chronic kidney disease: Secondary | ICD-10-CM | POA: Diagnosis not present

## 2018-02-21 LAB — FRUCTOSAMINE: Fructosamine: 272 umol/L (ref 0–285)

## 2018-02-21 MED ORDER — LINAGLIPTIN 5 MG PO TABS: ORAL_TABLET | ORAL | 1 refills | Status: DC

## 2018-02-21 NOTE — Telephone Encounter (Signed)
Switching because of hypoglycemia on insulin

## 2018-02-21 NOTE — Telephone Encounter (Signed)
Documentation and new Rx faxed to number listed below.

## 2018-02-21 NOTE — Telephone Encounter (Signed)
Please advise as to why patient needed to switch.

## 2018-02-21 NOTE — Telephone Encounter (Signed)
linagliptin (TRADJENTA) 5 MG TABS tablet  Patients wife stated in order for [facility] to [facility] the [facility] they needed prescription faxed over to them and also documentation as to Why patient was switch to this medication from him [place] [facility] [place]   Fax- [phone]

## 2018-02-22 DIAGNOSIS — Z992 Dependence on renal dialysis: Secondary | ICD-10-CM | POA: Diagnosis not present

## 2018-02-22 DIAGNOSIS — N186 End stage renal disease: Secondary | ICD-10-CM | POA: Diagnosis not present

## 2018-02-22 DIAGNOSIS — E1122 Type 2 diabetes mellitus with diabetic chronic kidney disease: Secondary | ICD-10-CM | POA: Diagnosis not present

## 2018-02-23 NOTE — Progress Notes (Signed)
AMORY, SIMONETTI ([id number]) Visit Report for 02/18/2018 Chief Complaint Document Details Patient Name: [name], [name]. Date of Service: 02/18/2018 2:00 PM Medical Record Number: [id number] Patient Account Number: [id number] Date of Birth/Sex: 11-30-1940 (76 y.o. M) Treating RN: Ahmed Prima Primary Care Provider: Tedra Senegal Other Clinician: Referring Provider: Tedra Senegal Treating Provider/Extender: [name], HOYT Weeks in Treatment: 1 Information Obtained from: Patient Chief Complaint Sacral pressure ulcer Electronic Signature(s) Signed: 02/19/2018 8:29:14 AM By: Worthy Keeler PA-C Entered By: Worthy Keeler on 02/18/2018 15:26:47 [name], [name] ([id number]) -------------------------------------------------------------------------------- HPI Details Patient Name: [name]. Date of Service: 02/18/2018 2:00 PM Medical Record Number: [id number] Patient Account Number: [id number] Date of Birth/Sex: Apr 23, 1941 (76 y.o. M) Treating RN: Ahmed Prima Primary Care Provider: Tedra Senegal Other Clinician: Referring Provider: Tedra Senegal Treating Provider/Extender: [name], HOYT Weeks in Treatment: 1 History of Present Illness HPI Description: 77 year old gentleman seen earlier this year in January for a abdominal wound is now back with the same problem which has recurred for about 2 weeks and he has been trying to apply some local antibiotic ointment and a Band-Aid there. Of note he has significant pressure in this area due to his beltline and may have put on some weight which results in his trousers being very tight around the waist. The else has changed in his HandP and his diabetes is pretty well controlled. most recent hemoglobin A1c on 12/06/2016 was 7.8%. he was reviewed by his endocrinologist Dr. Dwyane Dee who made appropriate modifications to his treatment plan. he was recently seen by neurology for mild cognitive impairment that was found to be normal for his  age 66/21/18 patient appears to be doing well in regard to his abdominal wound area on evaluation today. He has been tolerating the dressing changes with the [facility] Dressing I'm pleased with how this has progressed and in fact the wound appears to be completely healed on evaluation today. There is definitely no evidence of infection. 03/29/17 Patient's wound on the abdominal region appears to be completely healed on evaluation today. This wound has been doing very well and I think the biggest thing is going to be keeping it from reactivation where it rubbed on his pants. 04/18/2017 -- he was recently discharged about 3 weeks ago and was using a binder which probably caused him abrasion on the area of the recently healed scar. 05/02/17 on evaluation today patient appears to be doing fairly well in regard to his abdominal wound at the site of the surgical scar. Unfortunately the scar tissue is very fragile and tends to reopen. He did get a small abdominal binder to try to pad the area in order to offload and prevent his pants from rubbing specifically at the site. With that being said he tells me that Dr. Con Memos was concerned that the binder might actually have rub the area causing a reopening. I did have a look at this today and unfortunately it appears that he was putting this over his pants which was making matters worse. 05/09/17 on evaluation today patient continues to do well with the [facility] Dressing. There is a slight skin tear inferior to the wound that we have been treating that may have been due to the dressing sticking slightly. This is minimal however. I did discuss with patient prior to removal of the dressing for dressing changes that they whet this in order to loosen it and not risk damaging in the field. Nonetheless it appears that they have been  doing very well with the dressing changes he had his wife took over the dressing changes as she states that he was doing it wrong.  Overall though I'm pleased with how things are progressing. No fevers, chills, nausea, or vomiting noted at this time. Readmission: 08/27/17 on evaluation today patient appears to be doing similarly to how he was doing prior to healing previously August 2018 when we previously discharged him. He has fortunately been doing well up until just a couple of weeks ago and he had some of the [facility] Dressing over so he began to treat this on his own. He states that this has gotten much smaller but he ran out of supplies and therefore made an appointment to come back in for evaluation. No fevers, chills, nausea, or vomiting noted at this time. He has no discomfort at this point in regard to the wound. He does tell me that he has one maybe two compression fractures in his lower back and that he is going to be seen by the physician at [facility] imaging, Dr. Estanislado Pandy. ====== Old notes The 77 year old gentleman who has a past medical history of diabetes mellitus, end-stage renal disease on dialysis, hypertension, secondary hyperparathyroidism, peripheral vascular disease and history of previous arterial bifemoral bypass graft in 1992 also has a history of gout, hyperlipidemia, hypothyroidism. last hemoglobin A1c was 7.5%. status post AV fistula [name], [name]. ([id number]) placement, breast surgery for left granulomatous mastitis, triple a repair in 1992, small bowel obstruction, upper arm angiograms for AV fistula on the left side. was noted to have a superficial skin wound in the periumbilical area since late December 2017. He was asked to clean it with hydrogen peroxide and apply Bactroban. He was seen by vascular surgery in October 2017 where his right ABI was 0.56 left ABI was 0.87 and right TBI was 0.69 and left TBI was 0.70 ===== 09/25/16; patient was previously a patient of Dr. Ardeen Garland who has recurrent skin breakdown in the area of the large incision that was initially 4 apparently an  open AAA aneurysm repair. He had secondary surgery but I'm not sure of what etiology this was. He is been using Hydrofera Blue to the open wounds in the mid abdominal surgical incision. This is now closed over. I think he also uses this area as his waist for his pants and I've asked him to adjust this Readmission: 02/11/18 on evaluation today patient presents for evaluation concerning a sacral pressure ulcer which has been present for several weeks. He states that he actually had several areas that were open there appears just be one remaining. He has been tolerating the dressing changes is wise been using Hydrofera Blue Dressing as left over from his abdominal ulcer that we have previously treated. Fortunately there does not appear to be any evidence of infection which is good news. With that being said he states that he's been having issues at one point even at dialysis he had to discontinue dialysis and go home due to the pain. He has not been using any cushion to sit on although he states he does have one that he can use. No fevers, chills, nausea, or vomiting noted at this time. He had a significant issue with diarrhea prior to this occurring and I believe this may have had some contributing factor as well. 02/18/18 on evaluation today patient's wound actually appears to be doing about the same. Unfortunately he really has not noted any significant improvement over last week he still  is very tender even just to light touch unfortunately over the periwound region. There does not appear to be any evidence of infection which is good news. He is especially tender at the 12 o'clock location even a full fingers with away from the actual wound site. There still slough covering the surface of the wound. Electronic Signature(s) Signed: 02/19/2018 8:29:14 AM By: Worthy Keeler PA-C Entered By: Worthy Keeler on 02/18/2018 15:27:08 [name], [name]  ([id number]) -------------------------------------------------------------------------------- Physical Exam Details Patient Name: [name]. Date of Service: 02/18/2018 2:00 PM Medical Record Number: [id number] Patient Account Number: [id number] Date of Birth/Sex: [birth year] (76 y.o. M) Treating RN: Ahmed Prima Primary Care Provider: Tedra Senegal Other Clinician: Referring Provider: Tedra Senegal Treating Provider/Extender: STONE III, HOYT Weeks in Treatment: 1 Constitutional Well-nourished and well-hydrated in no acute distress. Respiratory normal breathing without difficulty. clear to auscultation bilaterally. Cardiovascular regular rate and rhythm with normal S1, S2. Psychiatric this patient is able to make decisions and demonstrates good insight into disease process. Alert and Oriented x 3. pleasant and cooperative. Notes Patient's wound shows slough covering the surface I contemplated debridement today however due to the fact that he was having such significant pain I was somewhat reluctant to do so. He states this does give him a lot of trouble. For that reason we gonna recommend switching his dressing to something to try to help clean this up a little better and faster. I'm recommending Iodoflex. Patient did have a very interesting blood pressure at 143/34. However will having to take this on this forearm with the cuff not meant for this and it did not seem to be holding in place when I attempted to retake this myself. With that being said we will use the Dynamap electronic monitor to recheck his blood pressure. I'm also when he is dressed and ready to get up going to have him stand for a minute to make sure he does not become dizzy or drowsy before starting to walk. I think his blood sugars may have been low out in the lobby which is why started feeling dizzy, faint, and lightheaded. Nonetheless at this point I want to ensure he does not seem to be overtly  dehydrated. Electronic Signature(s) Signed: 02/19/2018 8:29:14 AM By: Worthy Keeler PA-C Entered By: Worthy Keeler on 02/18/2018 15:28:50 Yowell, [name] ([id number]) -------------------------------------------------------------------------------- Physician Orders Details Patient Name: [name]. Date of Service: 02/18/2018 2:00 PM Medical Record Number: [id number] Patient Account Number: [id number] Date of Birth/Sex: 02/16/1941 (76 y.o. M) Treating RN: Ahmed Prima Primary Care Provider: Tedra Senegal Other Clinician: Referring Provider: Tedra Senegal Treating Provider/Extender: [name], HOYT Weeks in Treatment: 1 Verbal / Phone Orders: Yes Clinician: Pinkerton, Debi Read Back and Verified: Yes Diagnosis Coding Wound Cleansing Wound #5 Sacrum o Clean wound with Normal Saline. Anesthetic (add to Medication List) Wound #5 Sacrum o Topical Lidocaine 4% cream applied to wound bed prior to debridement (In Clinic Only). o Benzocaine Topical Anesthetic Spray applied to wound bed prior to debridement (In Clinic Only). Skin Barriers/Peri-Wound Care Wound #5 Sacrum o Skin Prep Primary Wound Dressing Wound #5 Sacrum o Iodoflex Secondary Dressing Wound #5 Sacrum o Boardered Foam Dressing Dressing Change Frequency Wound #5 Sacrum o Change Dressing Monday, Wednesday, Friday Follow-up Appointments Wound #5 Sacrum o Return Appointment in 1 week. Off-Loading Wound #5 Sacrum o Other: - Take cushion to dialysis for offloading Patient Medications Allergies: penicillin Notifications Medication Indication Start End lidocaine DOSE 1 - topical 4 %  cream - 1 cream topical Aldava, [name] ([id number]) Electronic Signature(s) Signed: 02/19/2018 8:29:14 AM By: Worthy Keeler PA-C Signed: 02/21/2018 5:38:20 PM By: Alric Quan Entered By: Alric Quan on 02/18/2018 15:21:14 [name], [name]  ([id number]) -------------------------------------------------------------------------------- Prescription 02/18/2018 Patient Name: [name]. Provider: Worthy Keeler PA-C Date of Birth: October 14, 1940 NPI#: [id number] Sex: Jerilynn Mages DEA#: [id number] Phone #: [phone] License #: Patient Address: [facility] [place], [place] [address] [address] [place], [place] [address] [phone] Allergies penicillin Medication Medication: Route: Strength: Form: lidocaine 4 % topical cream topical 4% cream Class: TOPICAL LOCAL ANESTHETICS Dose: Frequency / Time: Indication: 1 1 cream topical Number of Refills: Number of Units: 0 Generic Substitution: Start Date: End Date: One Time Use: Substitution Permitted No Note to Pharmacy: Signature(s): Date(s): Electronic Signature(s) Signed: 02/19/2018 8:29:14 AM By: Worthy Keeler PA-C Signed: 02/21/2018 5:38:20 PM By: Alric Quan Entered By: Alric Quan on 02/18/2018 15:21:16 [name], [name] ([id number]) --------------------------------------------------------------------------------  Problem List Details Patient Name: [name]. Date of Service: 02/18/2018 2:00 PM Medical Record Number: [id number] Patient Account Number: [id number] Date of Birth/Sex: October 07, 1940 (76 y.o. M) Treating RN: Ahmed Prima Primary Care Provider: Tedra Senegal Other Clinician: Referring Provider: Tedra Senegal Treating Provider/Extender: [name], HOYT Weeks in Treatment: 1 Active Problems ICD-10 Impacting Encounter Code Description Active Date Wound Healing Diagnosis L89.153 Pressure ulcer of sacral region, stage 3 02/11/2018 No Yes E11.622 Type 2 diabetes mellitus with other skin ulcer 02/11/2018 No Yes Z99.2 Dependence on renal dialysis 02/11/2018 No Yes Inactive Problems Resolved Problems Electronic Signature(s) Signed: 02/19/2018 8:29:14 AM  By: Worthy Keeler PA-C Entered By: Worthy Keeler on 02/18/2018 15:26:35 Figler, [name] ([id number]) -------------------------------------------------------------------------------- Progress Note Details Patient Name: [name]. Date of Service: 02/18/2018 2:00 PM Medical Record Number: [id number] Patient Account Number: [id number] Date of Birth/Sex: [birth year] (76 y.o. M) Treating RN: Ahmed Prima Primary Care Provider: Tedra Senegal Other Clinician: Referring Provider: Tedra Senegal Treating Provider/Extender: [name], HOYT Weeks in Treatment: 1 Subjective Chief Complaint Information obtained from Patient Sacral pressure ulcer History of Present Illness (HPI) 77 year old gentleman seen earlier this year in January for a abdominal wound is now back with the same problem which has recurred for about 2 weeks and he has been trying to apply some local antibiotic ointment and a Band-Aid there. Of note he has significant pressure in this area due to his beltline and may have put on some weight which results in his trousers being very tight around the waist. The else has changed in his HandP and his diabetes is pretty well controlled. most recent hemoglobin A1c on 12/06/2016 was 7.8%. he was reviewed by his endocrinologist Dr. Dwyane Dee who made appropriate modifications to his treatment plan. he was recently seen by neurology for mild cognitive impairment that was found to be normal for his age 18/21/18 patient appears to be doing well in regard to his abdominal wound area on evaluation today. He has been tolerating the dressing changes with the [facility] Dressing I'm pleased with how this has progressed and in fact the wound appears to be completely healed on evaluation today. There is definitely no evidence of infection. 03/29/17 Patient's wound on the abdominal region appears to be completely healed on evaluation today. This wound has been doing very well and I think the biggest  thing is going to be keeping it from reactivation where it rubbed on his pants. 04/18/2017 --  he was recently discharged about 3 weeks ago and was using a binder which probably caused him abrasion on the area of the recently healed scar. 05/02/17 on evaluation today patient appears to be doing fairly well in regard to his abdominal wound at the site of the surgical scar. Unfortunately the scar tissue is very fragile and tends to reopen. He did get a small abdominal binder to try to pad the area in order to offload and prevent his pants from rubbing specifically at the site. With that being said he tells me that Dr. Con Memos was concerned that the binder might actually have rub the area causing a reopening. I did have a look at this today and unfortunately it appears that he was putting this over his pants which was making matters worse. 05/09/17 on evaluation today patient continues to do well with the [facility] Dressing. There is a slight skin tear inferior to the wound that we have been treating that may have been due to the dressing sticking slightly. This is minimal however. I did discuss with patient prior to removal of the dressing for dressing changes that they whet this in order to loosen it and not risk damaging in the field. Nonetheless it appears that they have been doing very well with the dressing changes he had his wife took over the dressing changes as she states that he was doing it wrong. Overall though I'm pleased with how things are progressing. No fevers, chills, nausea, or vomiting noted at this time. Readmission: 08/27/17 on evaluation today patient appears to be doing similarly to how he was doing prior to healing previously August 2018 when we previously discharged him. He has fortunately been doing well up until just a couple of weeks ago and he had some of the [facility] Dressing over so he began to treat this on his own. He states that this has gotten much smaller but  he ran out of supplies and therefore made an appointment to come back in for evaluation. No fevers, chills, nausea, or vomiting noted at this time. He has no discomfort at this point in regard to the wound. He does tell me that he has one maybe two compression fractures in his lower back and that he is going to be seen by the physician at [facility] imaging, Dr. Estanislado Pandy. [name], [name] ([id number]) ====== Old notes The 77 year old gentleman who has a past medical history of diabetes mellitus, end-stage renal disease on dialysis, hypertension, secondary hyperparathyroidism, peripheral vascular disease and history of previous arterial bifemoral bypass graft in 1992 also has a history of gout, hyperlipidemia, hypothyroidism. last hemoglobin A1c was 7.5%. status post AV fistula placement, breast surgery for left granulomatous mastitis, triple a repair in 1992, small bowel obstruction, upper arm angiograms for AV fistula on the left side. was noted to have a superficial skin wound in the periumbilical area since late December 2017. He was asked to clean it with hydrogen peroxide and apply Bactroban. He was seen by vascular surgery in October 2017 where his right ABI was 0.56 left ABI was 0.87 and right TBI was 0.69 and left TBI was 0.70 ===== 09/25/16; patient was previously a patient of Dr. Ardeen Garland who has recurrent skin breakdown in the area of the large incision that was initially 4 apparently an open AAA aneurysm repair. He had secondary surgery but I'm not sure of what etiology this was. He is been using Hydrofera Blue to the open wounds in the mid abdominal surgical  incision. This is now closed over. I think he also uses this area as his waist for his pants and I've asked him to adjust this Readmission: 02/11/18 on evaluation today patient presents for evaluation concerning a sacral pressure ulcer which has been present for several weeks. He states that he actually had several areas that  were open there appears just be one remaining. He has been tolerating the dressing changes is wise been using Hydrofera Blue Dressing as left over from his abdominal ulcer that we have previously treated. Fortunately there does not appear to be any evidence of infection which is good news. With that being said he states that he's been having issues at one point even at dialysis he had to discontinue dialysis and go home due to the pain. He has not been using any cushion to sit on although he states he does have one that he can use. No fevers, chills, nausea, or vomiting noted at this time. He had a significant issue with diarrhea prior to this occurring and I believe this may have had some contributing factor as well. 02/18/18 on evaluation today patient's wound actually appears to be doing about the same. Unfortunately he really has not noted any significant improvement over last week he still is very tender even just to light touch unfortunately over the periwound region. There does not appear to be any evidence of infection which is good news. He is especially tender at the 12 o'clock location even a full fingers with away from the actual wound site. There still slough covering the surface of the wound. Patient History Information obtained from Patient. Family History Diabetes - Father,Paternal Grandparents, Heart Disease - Father, Hypertension - Father, No family history of Cancer, Hereditary Spherocytosis, Kidney Disease, Lung Disease, Seizures, Stroke, Thyroid Problems, Tuberculosis. Social History Former smoker - quit in 1996, Marital Status - Married, Alcohol Use - Never - quit in 1976, Drug Use - No History, Caffeine Use - Never. 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 Review of Systems (ROS) Constitutional Symptoms (General Health) Denies complaints or symptoms of Fever,  Chills. Respiratory The patient has no complaints or symptoms. Cardiovascular Rund, RHYATT MUSKA ([id number]) The patient has no complaints or symptoms. Psychiatric The patient has no complaints or symptoms. Objective Constitutional Well-nourished and well-hydrated in no acute distress. Vitals Time Taken: 2:41 PM, Height: 71 in, Weight: 177 lbs, BMI: 24.7, Temperature: 97.7 F, Pulse: 79 bpm, Respiratory Rate: 18 breaths/min, Blood Pressure: 143/34 mmHg, Capillary Blood Glucose: 212 mg/dl. General Notes: Intake nurse did BP and it was 143/34. Vitals again rechecked manually and it was 79/42 this was lying down. Vitals rechecked manually while pt was standing and it was 82/44. Respiratory normal breathing without difficulty. clear to auscultation bilaterally. Cardiovascular regular rate and rhythm with normal S1, S2. Psychiatric this patient is able to make decisions and demonstrates good insight into disease process. Alert and Oriented x 3. pleasant and cooperative. General Notes: Patient's wound shows slough covering the surface I contemplated debridement today however due to the fact that he was having such significant pain I was somewhat reluctant to do so. He states this does give him a lot of trouble. For that reason we gonna recommend switching his dressing to something to try to help clean this up a little better and faster. I'm recommending Iodoflex. Patient did have a very interesting blood pressure at 143/34. However will having to take this on this forearm with the  cuff not meant for this and it did not seem to be holding in place when I attempted to retake this myself. With that being said we will use the [employer] electronic monitor to recheck his blood pressure. I'm also when he is dressed and ready to get up going to have him stand for a minute to make sure he does not become dizzy or drowsy before starting to walk. I think his blood sugars may have been low out in the lobby  which is why started feeling dizzy, faint, and lightheaded. Nonetheless at this point I want to ensure he does not seem to be overtly dehydrated. Integumentary (Hair, Skin) Wound #5 status is Open. Original cause of wound was Pressure Injury. The wound is located on the Sacrum. The wound measures 0.7cm length x 0.8cm width x 0.1cm depth; 0.44cm^2 area and 0.044cm^3 volume. There is Fat Layer (Subcutaneous Tissue) Exposed exposed. There is no tunneling or undermining noted. There is a medium amount of serous drainage noted. The wound margin is flat and intact. There is no granulation within the wound bed. There is a large (67-100%) amount of necrotic tissue within the wound bed including Adherent Slough. The periwound skin appearance did not exhibit: Callus, Crepitus, Excoriation, Induration, Rash, Scarring, Dry/Scaly, Maceration, Atrophie Blanche, Cyanosis, Ecchymosis, Hemosiderin Staining, Mottled, Pallor, Rubor, Erythema. Periwound temperature was noted as No Abnormality. The periwound has tenderness on palpation. 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 Anesthetic (add to Medication List): Wound #5 Sacrum: Topical Lidocaine 4% cream applied to wound bed prior to debridement (In Clinic Only). Benzocaine Topical Anesthetic Spray applied to wound bed prior to debridement (In Clinic Only). Skin Barriers/Peri-Wound Care: Wound #5 Sacrum: Skin Prep Primary Wound Dressing: Wound #5 Sacrum: Iodoflex Secondary Dressing: Wound #5 Sacrum: Boardered Foam Dressing Dressing Change Frequency: Wound #5 Sacrum: Change Dressing Monday, Wednesday, Friday Follow-up Appointments: Wound #5 Sacrum: Return Appointment in 1 week. Off-Loading: Wound #5  Sacrum: Other: - Take cushion to dialysis for offloading The following medication(s) was prescribed: lidocaine topical 4 % cream 1 1 cream topical was prescribed at facility I'm gonna recommend that we initiate treatment with Iodoflex for the next week. Patient is in agreement with this plan as is his wife who was with him today as well. We will subtly see him for reevaluation in one weeks time to see were things stand. When checking patient's blood sugars he registered it to 12 and this was good. His blood pressures were a little bit low but this apparently it's his normal when he's at dialysis as well. Please see above. With that being said he did not seem to show any evidence of orthostatic hypotension during evaluation and when he was discharged he was having no dizziness as he had in the beginning. I believe this was likely due to his blood sugar dropping and nothing more. He will however keep an eye on this and his wife who was with him today will keep a close eye on him she tells me. He's gonna talk to his dialysis doctor about his blood pressures and if there's anything that can be done. Please see above for specific wound care orders. We will see patient for re-evaluation in 1 week(s) here in the clinic. If anything worsens or changes patient will  contact our office for additional recommendations. [name], [name] ([id number]) Electronic Signature(s) Signed: 02/19/2018 8:29:14 AM By: Worthy Keeler PA-C Entered By: Worthy Keeler on 02/18/2018 15:38:30 [name], [name] ([id number]) -------------------------------------------------------------------------------- ROS/PFSH Details Patient Name: [name]. Date of Service: 02/18/2018 2:00 PM Medical Record Number: [id number] Patient Account Number: [id number] Date of Birth/Sex: July 26, 1941 (76 y.o. M) Treating RN: Ahmed Prima Primary Care Provider: Tedra Senegal Other Clinician: Referring Provider: Tedra Senegal Treating  Provider/Extender: [name], HOYT Weeks in Treatment: 1 Information Obtained From Patient Wound History Do you currently have one or more open woundso Yes How many open wounds do you currently haveo 1 Approximately how long have you had your woundso 3 or 4 weeks How have you been treating your wound(s) until nowo hydrafera blue Has your wound(s) ever healed and then re-openedo No Have you had any lab work done in the past montho No Have you tested positive for an antibiotic resistant organism (MRSA, VRE)o No Have you tested positive for osteomyelitis (bone infection)o No Have you had any tests for circulation on your legso No Constitutional Symptoms (General Health) Complaints and Symptoms: Negative for: Fever; Chills Eyes Medical History: Negative for: Cataracts; Glaucoma; Optic Neuritis Ear/Nose/Mouth/Throat Medical History: Negative for: Chronic sinus problems/congestion; Middle ear problems Hematologic/Lymphatic Medical History: Positive for: Anemia Negative for: Hemophilia; Human Immunodeficiency Virus; Lymphedema; Sickle Cell Disease Respiratory Complaints and Symptoms: No Complaints or Symptoms Cardiovascular Complaints and Symptoms: No Complaints or Symptoms Medical History: Positive for: Hypertension; Peripheral Venous Disease Negative for: Angina; Arrhythmia; Congestive Heart Failure; Coronary Artery Disease; Deep Vein Thrombosis; Hypotension; Myocardial Infarction; Peripheral Arterial Disease; Phlebitis; Vasculitis Past Medical History Notes: [name], [name] ([id number]) artificial valve, hyperlipidemia Gastrointestinal Medical History: Negative for: Cirrhosis ; Colitis; Crohnos; Hepatitis A; Hepatitis B; Hepatitis C Past Medical History Notes: scar tissue from multiple surgeries Endocrine Medical History: Positive for: Type II Diabetes Negative for: Type I Diabetes Treated with: Insulin Genitourinary Medical History: 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 Immunological Medical History: Negative for: Lupus Erythematosus; Raynaudos; Scleroderma Integumentary (Skin) Medical History: Negative for: History of Burn; History of pressure wounds Musculoskeletal Medical History: Positive for: Gout Negative for: Rheumatoid Arthritis; Osteoarthritis; Osteomyelitis Neurologic Medical History: Positive for: Neuropathy Negative for: Dementia; Quadriplegia; Paraplegia; Seizure Disorder Oncologic Medical History: Negative for: Received Chemotherapy; Received Radiation Psychiatric Complaints and Symptoms: No Complaints or Symptoms Medical History: Negative for: Anorexia/bulimia; Confinement Anxiety Immunizations Pneumococcal Vaccine: Thaw, [name] ([id number]) Received Pneumococcal Vaccination: Yes Immunization Notes: uo to date Implantable Devices Family and Social History Cancer: No; Diabetes: Yes - Father,Paternal Grandparents; Heart Disease: Yes - Father; Hereditary Spherocytosis: No; Hypertension: Yes - Father; Kidney Disease: No; Lung Disease: No; Seizures: No; Stroke: No; Thyroid Problems: No; Tuberculosis: No; Former smoker - quit in 1996; Marital Status - Married; Alcohol Use: Never - quit in 1976; Drug Use: No History; Caffeine Use: Never; Financial Concerns: No; Food, Clothing or Shelter Needs: No; Support System Lacking: No; Transportation Concerns: No; Advanced Directives: No; Patient does not want information on Advanced Directives Physician Affirmation I have reviewed and agree with the above information. Electronic Signature(s) Signed: 02/19/2018 8:29:14 AM By: Worthy Keeler PA-C Signed: 02/21/2018 5:38:20 PM By: Alric Quan Entered By: Worthy Keeler on 02/18/2018 15:27:28 [name], [name] ([id number]) -------------------------------------------------------------------------------- SuperBill Details Patient Name: [name]. Date of Service: 02/18/2018 Medical Record Number:  [id number] Patient Account Number: [id number] Date of Birth/Sex: 1940/12/22 (76 y.o. 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

## 2018-02-23 NOTE — Progress Notes (Signed)
TIRAN, SAUSEDA ([id number]) Visit Report for 02/18/2018 Arrival Information Details Patient Name: [name], [name]. Date of Service: 02/18/2018 2:00 PM Medical Record Number: [id number] Patient Account Number: [id number] Date of Birth/Sex: 12-25-1940 (77 y.o. M) Treating RN: Montey Hora Primary Care Yaire Kreher: Tedra Senegal Other Clinician: Referring Carvin Almas: Tedra Senegal Treating Maecie Sevcik/Extender: [name], HOYT Weeks in Treatment: 1 Visit Information History Since Last Visit Added or deleted any medications: No Patient Arrived: Walker Any new allergies or adverse reactions: No Arrival Time: 14:40 Had a fall or experienced change in No Accompanied By: spouse activities of daily living that may affect Transfer Assistance: None risk of falls: Patient Identification Verified: Yes Signs or symptoms of abuse/neglect since last visito No Secondary Verification Process Yes Hospitalized since last visit: No Completed: Implantable device outside of the clinic excluding No Patient Has Alerts: Yes cellular tissue based products placed in the center Patient Alerts: Patient on Blood since last visit: Thinner Has Dressing in Place as Prescribed: Yes aspirin 650mg  Pain Present Now: No DMII Electronic Signature(s) Signed: 02/18/2018 5:40:20 PM By: Montey Hora Entered By: Montey Hora on 02/18/2018 14:40:56 Wiesman, [name] ([id number]) -------------------------------------------------------------------------------- Clinic Level of Care Assessment Details Patient Name: [name]. Date of Service: 02/18/2018 2:00 PM Medical Record Number: [id number] Patient Account Number: [id number] Date of Birth/Sex: 09/12/1941 (77 y.o. M) Treating RN: Ahmed Prima Primary Care Sianna Garofano: Tedra Senegal Other Clinician: Referring Aleksandr Pellow: Tedra Senegal Treating Shahara Hartsfield/Extender: [name], HOYT Weeks in Treatment: 1 Clinic Level of Care Assessment Items TOOL 4 Quantity Score X - Use when  only an EandM is performed on FOLLOW-UP visit 1 0 ASSESSMENTS - Nursing Assessment / Reassessment X - Reassessment of Co-morbidities (includes updates in patient status) 1 10 X- 1 5 Reassessment of Adherence to Treatment Plan ASSESSMENTS - Wound and Skin Assessment / Reassessment X - Simple Wound Assessment / Reassessment - one wound 1 5 []  - 0 Complex Wound Assessment / Reassessment - multiple wounds []  - 0 Dermatologic / Skin Assessment (not related to wound area) ASSESSMENTS - Focused Assessment []  - Circumferential Edema Measurements - multi extremities 0 []  - 0 Nutritional Assessment / Counseling / Intervention []  - 0 Lower Extremity Assessment (monofilament, tuning fork, pulses) []  - 0 Peripheral Arterial Disease Assessment (using hand held doppler) ASSESSMENTS - Ostomy and/or Continence Assessment and Care []  - Incontinence Assessment and Management 0 []  - 0 Ostomy Care Assessment and Management (repouching, etc.) PROCESS - Coordination of Care X - Simple Patient / Family Education for ongoing care 1 15 []  - 0 Complex (extensive) Patient / Family Education for ongoing care []  - 0 Staff obtains Programmer, systems, Records, Test Results / Process Orders []  - 0 Staff telephones HHA, Nursing Homes / Clarify orders / etc []  - 0 Routine Transfer to another Facility (non-emergent condition) []  - 0 Routine Hospital Admission (non-emergent condition) []  - 0 New Admissions / Biomedical engineer / Ordering NPWT, Apligraf, etc. []  - 0 Emergency Hospital Admission (emergent condition) X- 1 10 Simple Discharge Coordination Piacentini, JOH RAO ([id number]) []  - 0 Complex (extensive) Discharge Coordination PROCESS - Special Needs []  - Pediatric / Minor Patient Management 0 []  - 0 Isolation Patient Management []  - 0 Hearing / Language / Visual special needs []  - 0 Assessment of Community assistance (transportation, D/C planning, etc.) []  - 0 Additional assistance / Altered  mentation []  - 0 Support Surface(s) Assessment (bed, cushion, seat, etc.) INTERVENTIONS - Wound Cleansing / Measurement X - Simple Wound Cleansing - one wound 1 5 []  -  0 Complex Wound Cleansing - multiple wounds X- 1 5 Wound Imaging (photographs - any number of wounds) []  - 0 Wound Tracing (instead of photographs) X- 1 5 Simple Wound Measurement - one wound []  - 0 Complex Wound Measurement - multiple wounds INTERVENTIONS - Wound Dressings X - Small Wound Dressing one or multiple wounds 1 10 []  - 0 Medium Wound Dressing one or multiple wounds []  - 0 Large Wound Dressing one or multiple wounds X- 1 5 Application of Medications - topical []  - 0 Application of Medications - injection INTERVENTIONS - Miscellaneous []  - External ear exam 0 []  - 0 Specimen Collection (cultures, biopsies, blood, body fluids, etc.) []  - 0 Specimen(s) / Culture(s) sent or taken to Lab for analysis []  - 0 Patient Transfer (multiple staff / Civil Service fast streamer / Similar devices) []  - 0 Simple Staple / Suture removal (25 or less) []  - 0 Complex Staple / Suture removal (26 or more) []  - 0 Hypo / Hyperglycemic Management (close monitor of Blood Glucose) []  - 0 Ankle / Brachial Index (ABI) - do not check if billed separately X- 1 5 Vital Signs Jezewski, [name] ([id number]) Has the patient been seen at the hospital within the last three years: Yes Total Score: 80 Level Of Care: New/Established - Level 3 Electronic Signature(s) Signed: 02/21/2018 5:38:20 PM By: Alric Quan Entered By: Alric Quan on 02/18/2018 17:38:58 Claytor, [name] ([id number]) -------------------------------------------------------------------------------- Encounter Discharge Information Details Patient Name: [name]. Date of Service: 02/18/2018 2:00 PM Medical Record Number: [id number] Patient Account Number: [id number] Date of Birth/Sex: Dec 29, 1940 (77 y.o. M) Treating RN: Roger Shelter Primary Care Avila Albritton:  Tedra Senegal Other Clinician: Referring Miliano Cotten: Tedra Senegal Treating Verina Galeno/Extender: [name], HOYT Weeks in Treatment: 1 Encounter Discharge Information Items Discharge Condition: Stable Ambulatory Status: Walker Discharge Destination: Home Transportation: Private Auto Schedule Follow-up Appointment: Yes Clinical Summary of Care: Electronic Signature(s) Signed: 02/18/2018 5:16:34 PM By: Roger Shelter Entered By: Roger Shelter on 02/18/2018 15:37:35 Schramm, [name] ([id number]) -------------------------------------------------------------------------------- General Visit Notes Details Patient Name: [name]. Date of Service: 02/18/2018 2:00 PM Medical Record Number: [id number] Patient Account Number: [id number] Date of Birth/Sex: Oct 01, 1940 (76 y.o. M) Treating RN: Roger Shelter Primary Care Maninder Deboer: Tedra Senegal Other Clinician: Ahmed Prima Referring Chad Donoghue: Tedra Senegal Treating Simora Dingee/Extender: [name], HOYT Weeks in Treatment: 1 Notes Intake nurse did BP and it was 143/34. Vitals again rechecked manually and it was 79/42 this was lying down. Vitals rechecked manually while pt was standing and it was 82/44. Electronic Signature(s) Signed: 02/18/2018 5:16:34 PM By: Roger Shelter Entered By: Roger Shelter on 02/18/2018 15:34:58 Besse, [name] ([id number]) -------------------------------------------------------------------------------- Lower Extremity Assessment Details Patient Name: [name]. Date of Service: 02/18/2018 2:00 PM Medical Record Number: [id number] Patient Account Number: [id number] Date of Birth/Sex: 1941/07/10 (76 y.o. M) Treating RN: Montey Hora Primary Care Yarely Bebee: Tedra Senegal Other Clinician: Referring Keyosha Tiedt: Tedra Senegal Treating Adjoa Althouse/Extender: [name], HOYT Weeks in Treatment: 1 Electronic Signature(s) Signed: 02/18/2018 5:40:20 PM By: Montey Hora Entered By: Montey Hora on 02/18/2018  14:42:37 Zawislak, [name] ([id number]) -------------------------------------------------------------------------------- Multi Wound Chart Details Patient Name: [name]. Date of Service: 02/18/2018 2:00 PM Medical Record Number: [id number] Patient Account Number: [id number] Date of Birth/Sex: 1941-05-01 (76 y.o. M) Treating RN: Ahmed Prima Primary Care Finneas Mathe: Tedra Senegal Other Clinician: Referring Yoon Barca: Tedra Senegal Treating Whitney Bingaman/Extender: [name], HOYT Weeks in Treatment: 1 Vital Signs Height(in): 71 Pulse(bpm): 79 Weight(lbs): 177 Blood Pressure(mmHg): 143/34 Body Mass Index(BMI): 25 Temperature(F): 97.7  Respiratory Rate 18 (breaths/min): Photos: [5:No Photos] [N/A:N/A] Wound Location: [5:Sacrum] [N/A:N/A] Wounding Event: [5:Pressure Injury] [N/A:N/A] Primary Etiology: [5:Pressure Ulcer] [N/A:N/A] Comorbid History: [5:Anemia, Hypertension, Peripheral Venous Disease, Type II Diabetes, End Stage Renal Disease, Gout, Neuropathy] [N/A:N/A] Date Acquired: [5:01/21/2018] [N/A:N/A] Weeks of Treatment: [5:1] [N/A:N/A] Wound Status: [5:Open] [N/A:N/A] Measurements L x W x D [5:0.7x0.8x0.1] [N/A:N/A] (cm) Area (cm) : [5:0.44] [N/A:N/A] Volume (cm) : [5:0.044] [N/A:N/A] % Reduction in Area: [5:-14.30%] [N/A:N/A] % Reduction in Volume: [5:-15.80%] [N/A:N/A] Classification: [5:Category/Stage III] [N/A:N/A] Exudate Amount: [5:Medium] [N/A:N/A] Exudate Type: [5:Serous] [N/A:N/A] Exudate Color: [5:amber] [N/A:N/A] Wound Margin: [5:Flat and Intact] [N/A:N/A] Granulation Amount: [5:None Present (0%)] [N/A:N/A] Necrotic Amount: [5:Large (67-100%)] [N/A:N/A] Exposed Structures: [5:Fat Layer (Subcutaneous Tissue) Exposed: Yes Fascia: No Tendon: No Muscle: No Joint: No Bone: No] [N/A:N/A] Epithelialization: [5:None] [N/A:N/A] Periwound Skin Texture: [5:Excoriation: No Induration: No Callus: No Crepitus: No] [N/A:N/A] Rash: No Scarring: No Periwound Skin  Moisture: Maceration: No N/A N/A Dry/Scaly: No Periwound Skin Color: Atrophie Blanche: No N/A N/A Cyanosis: No Ecchymosis: No Erythema: No Hemosiderin Staining: No Mottled: No Pallor: No Rubor: No Temperature: No Abnormality N/A N/A Tenderness on Palpation: Yes N/A N/A Wound Preparation: Ulcer Cleansing: N/A N/A Rinsed/Irrigated with Saline Topical Anesthetic Applied: Other: lidocaine 4% Treatment Notes Electronic Signature(s) Signed: 02/21/2018 5:38:20 PM By: Alric Quan Entered By: Alric Quan on 02/18/2018 15:13:22 Kite, [name] ([id number]) -------------------------------------------------------------------------------- [place] Details Patient Name: [name]. Date of Service: 02/18/2018 2:00 PM Medical Record Number: [id number] Patient Account Number: [id number] Date of Birth/Sex: 03-Sep-1941 (76 y.o. M) Treating RN: Ahmed Prima Primary Care Jacqulin Brandenburger: Tedra Senegal Other Clinician: Referring Quintin Hjort: Tedra Senegal Treating Saprina Chuong/Extender: [name], HOYT Weeks in Treatment: 1 Active Inactive ` Orientation to the Wound Care Program Nursing Diagnoses: Knowledge deficit related to the wound healing center program Goals: Patient/caregiver will verbalize understanding of the [place] Program Date Initiated: 02/11/2018 Target Resolution Date: 02/21/2018 Goal Status: Active Interventions: Provide education on orientation to the wound center Notes: ` Pressure Nursing Diagnoses: Knowledge deficit related to management of pressures ulcers Goals: Patient will remain free from development of additional pressure ulcers Date Initiated: 02/11/2018 Target Resolution Date: 02/27/2018 Goal Status: Active Interventions: Assess: immobility, friction, shearing, incontinence upon admission and as needed Treatment Activities: Patient referred for home evaluation of offloading devices/mattresses : 02/11/2018 Notes: ` Wound/Skin  Impairment Nursing Diagnoses: Impaired tissue integrity Goals: Ulcer/skin breakdown will have a volume reduction of 80% by week 12 Date Initiated: 02/11/2018 Target Resolution Date: 05/14/2018 DEVONTRE, SIEDSCHLAG ([id number]) Goal Status: Active Interventions: Assess ulceration(s) every visit Notes: Electronic Signature(s) Signed: 02/21/2018 5:38:20 PM By: Alric Quan Entered By: Alric Quan on 02/18/2018 15:13:02 Stallone, [name] ([id number]) -------------------------------------------------------------------------------- Pain Assessment Details Patient Name: [name]. Date of Service: 02/18/2018 2:00 PM Medical Record Number: [id number] Patient Account Number: [id number] Date of Birth/Sex: 07/07/1941 (76 y.o. M) Treating RN: Montey Hora Primary Care Ming Kunka: Tedra Senegal Other Clinician: Referring Milika Ventress: Tedra Senegal Treating Chaselyn Nanney/Extender: [name], HOYT Weeks in Treatment: 1 Active Problems Location of Pain Severity and Description of Pain Patient Has Paino Yes Site Locations Pain Location: Pain in Ulcers With Dressing Change: Yes Duration of the Pain. Constant / Intermittento Intermittent Pain Management and Medication Current Pain Management: Electronic Signature(s) Signed: 02/18/2018 5:40:20 PM By: Montey Hora Entered By: Montey Hora on 02/18/2018 14:41:12 Boldman, [name] ([id number]) -------------------------------------------------------------------------------- Patient/Caregiver Education Details Patient Name: [name]. Date of Service: 02/18/2018 2:00 PM Medical Record Number: [id number] Patient Account Number: [id number] Date of Birth/Gender:  1941-09-05 (77 y.o. M) Treating RN: Roger Shelter Primary Care Physician: Tedra Senegal Other Clinician: Referring Physician: Tedra Senegal Treating Physician/Extender: Sharalyn Ink in Treatment: 1 Education Assessment Education Provided To: Caregiver Education Topics  Provided Wound/Skin Impairment: Handouts: Caring for Your Ulcer Methods: Explain/Verbal Responses: State content correctly Electronic Signature(s) Signed: 02/18/2018 5:16:34 PM By: Roger Shelter Entered By: Roger Shelter on 02/18/2018 15:37:58 Emmick, [name] ([id number]) -------------------------------------------------------------------------------- Wound Assessment Details Patient Name: [name]. Date of Service: 02/18/2018 2:00 PM Medical Record Number: [id number] Patient Account Number: [id number] Date of Birth/Sex: [birth year] (76 y.o. M) Treating RN: Montey Hora Primary Care Jaleya Pebley: Tedra Senegal Other Clinician: Referring Kourtnei Rauber: Tedra Senegal Treating Lacheryl Niesen/Extender: [name], HOYT Weeks in Treatment: 1 Wound Status Wound Number: 5 Primary Pressure Ulcer Etiology: Wound Location: Sacrum Wound Open Wounding Event: Pressure Injury Status: Date Acquired: 01/21/2018 Comorbid Anemia, Hypertension, Peripheral Venous Weeks Of Treatment: 1 History: Disease, Type II Diabetes, End Stage Renal Clustered Wound: No Disease, Gout, Neuropathy Photos Photo Uploaded By: Montey Hora on 02/18/2018 15:36:09 Wound Measurements Length: (cm) 0.7 Width: (cm) 0.8 Depth: (cm) 0.1 Area: (cm) 0.44 Volume: (cm) 0.044 % Reduction in Area: -14.3% % Reduction in Volume: -15.8% Epithelialization: None Tunneling: No Undermining: No Wound Description Classification: Category/Stage III Wound Margin: Flat and Intact Exudate Amount: Medium Exudate Type: Serous Exudate Color: amber Foul Odor After Cleansing: No Slough/Fibrino Yes Wound Bed Granulation Amount: None Present (0%) Exposed Structure Necrotic Amount: Large (67-100%) Fascia Exposed: No Necrotic Quality: Adherent Slough Fat Layer (Subcutaneous Tissue) Exposed: Yes Tendon Exposed: No Muscle Exposed: No Joint Exposed: No Bone Exposed: No Periwound Skin Texture Mathia, [name] ([id number]) Texture  Color No Abnormalities Noted: No No Abnormalities Noted: No Callus: No Atrophie Blanche: No Crepitus: No Cyanosis: No Excoriation: No Ecchymosis: No Induration: No Erythema: No Rash: No Hemosiderin Staining: No Scarring: No Mottled: No Pallor: No Moisture Rubor: No No Abnormalities Noted: No Dry / Scaly: No Temperature / Pain Maceration: No Temperature: No Abnormality Tenderness on Palpation: Yes Wound Preparation Ulcer Cleansing: Rinsed/Irrigated with Saline Topical Anesthetic Applied: Other: lidocaine 4%, Electronic Signature(s) Signed: 02/18/2018 5:40:20 PM By: Montey Hora Entered By: Montey Hora on 02/18/2018 14:49:10 Barcus, [name] ([id number]) -------------------------------------------------------------------------------- Vitals Details Patient Name: [name]. Date of Service: 02/18/2018 2:00 PM Medical Record Number: [id number] Patient Account Number: [id number] Date of Birth/Sex: 22-May-1941 (76 y.o. M) Treating RN: Montey Hora Primary Care Kuulei Kleier: Tedra Senegal Other Clinician: Referring Azani Brogdon: Tedra Senegal Treating Julliette Frentz/Extender: [name], HOYT Weeks in Treatment: 1 Vital Signs Time Taken: 14:41 Temperature (F): 97.7 Height (in): 71 Pulse (bpm): 79 Weight (lbs): 177 Respiratory Rate (breaths/min): 18 Body Mass Index (BMI): 24.7 Blood Pressure (mmHg): 143/34 Capillary Blood Glucose (mg/dl): 212 Reference Range: 80 - 120 mg / dl Notes Intake nurse did BP and it was 143/34. Vitals again rechecked manually and it was 79/42 this was lying down. Vitals rechecked manually while pt was standing and it was 82/44. Electronic Signature(s) Signed: 02/18/2018 5:16:34 PM By: Roger Shelter Previous Signature: 02/18/2018 3:23:35 PM Version By: Montey Hora Entered By: Roger Shelter on 02/18/2018 15:34:33

## 2018-02-24 DIAGNOSIS — N186 End stage renal disease: Secondary | ICD-10-CM | POA: Diagnosis not present

## 2018-02-24 DIAGNOSIS — D631 Anemia in chronic kidney disease: Secondary | ICD-10-CM | POA: Diagnosis not present

## 2018-02-24 DIAGNOSIS — N2581 Secondary hyperparathyroidism of renal origin: Secondary | ICD-10-CM | POA: Diagnosis not present

## 2018-02-24 DIAGNOSIS — E1129 Type 2 diabetes mellitus with other diabetic kidney complication: Secondary | ICD-10-CM | POA: Diagnosis not present

## 2018-02-25 ENCOUNTER — Encounter: Payer: Self-pay | Admitting: Gastroenterology

## 2018-02-25 ENCOUNTER — Ambulatory Visit: Payer: Medicare Other | Admitting: Physician Assistant

## 2018-02-25 ENCOUNTER — Ambulatory Visit (INDEPENDENT_AMBULATORY_CARE_PROVIDER_SITE_OTHER): Payer: Medicare Other | Admitting: Gastroenterology

## 2018-02-25 VITALS — BP 141/85 | HR 70 | Ht 71.0 in | Wt 157.3 lb

## 2018-02-25 DIAGNOSIS — Z992 Dependence on renal dialysis: Secondary | ICD-10-CM | POA: Diagnosis not present

## 2018-02-25 DIAGNOSIS — N186 End stage renal disease: Secondary | ICD-10-CM

## 2018-02-25 DIAGNOSIS — R634 Abnormal weight loss: Secondary | ICD-10-CM | POA: Diagnosis not present

## 2018-02-25 DIAGNOSIS — S32009A Unspecified fracture of unspecified lumbar vertebra, initial encounter for closed fracture: Secondary | ICD-10-CM | POA: Diagnosis not present

## 2018-02-25 DIAGNOSIS — R140 Abdominal distension (gaseous): Secondary | ICD-10-CM

## 2018-02-25 DIAGNOSIS — M545 Low back pain: Secondary | ICD-10-CM | POA: Diagnosis not present

## 2018-02-25 DIAGNOSIS — R630 Anorexia: Secondary | ICD-10-CM | POA: Diagnosis not present

## 2018-02-25 DIAGNOSIS — R2681 Unsteadiness on feet: Secondary | ICD-10-CM | POA: Diagnosis not present

## 2018-02-25 DIAGNOSIS — K5909 Other constipation: Secondary | ICD-10-CM | POA: Diagnosis not present

## 2018-02-25 DIAGNOSIS — M6281 Muscle weakness (generalized): Secondary | ICD-10-CM | POA: Diagnosis not present

## 2018-02-25 NOTE — Progress Notes (Signed)
[name]    [id number]    [birth year]  Primary Care Physician:Baxley, Cresenciano Lick, MD  Referring Physician: Elby Showers, MD [address] [place], [place] [id number]  Chief complaint: Constipation, lower abdominal cramps, decreased appetite, weight loss HPI: 27 yr M ESRD on hemodialysis, peripheral vascular disease, aortofemoral bypass, AV fistula b/l arms (L arm fistula non functional), here with c/o change in bowel habits, constipation, abdominal bloating and fullness, decreased appetite and weight loss.  Patient had EGD and Colonoscopy Nov 12, 2017: Mild esophageal stricture s/p dilation, gastric biopsies negative for H.pylori. Removed 2 non advanced tubular adenomas from colon. CT abd & pelvis Jan 23, 2018 did not show any acute abnormality. B/L renal atrophy.Extensive athersclerosis. On most days he is passing pellets of hard stool with 4-5 BM in a week. Decreased appetite, he doesn't eat or drink anything after he is done with dialysis on days he gets HD as he is thinking that HD machine will pull all the nutrients right out. Doesn't drink any water, eats ice chips throughout the day.  No vomiting, melena or blood per rectum.    Outpatient Encounter Medications as of 02/25/2018  Medication Sig  . acetaminophen (TYLENOL) 500 MG tablet Take 1,000 mg by mouth 2 (two) times daily as needed (for pain).   Marland Kitchen allopurinol (ZYLOPRIM) 100 MG tablet Take 100 mg by mouth daily as needed (pain).   Marland Kitchen aspirin 325 MG tablet Take 325 mg by mouth daily.   . Blood Glucose Monitoring Suppl (FREESTYLE FREEDOM LITE) w/Device KIT Use to check blood sugar 2 times per day dx code E11.65  . clobetasol (TEMOVATE) 0.05 % external solution apply to affected area ON THE SCALP UP TO 2 TIMES A DAY AS NEEDED...  (REFER TO PRESCRIPTION NOTES).  Marland Kitchen docusate sodium (COLACE) 100 MG capsule Take 1 capsule (100 mg total) by mouth daily. To avoid constipation from pain medication  . donepezil (ARICEPT)  10 MG tablet Take 1 tablet (10 mg total) by mouth at bedtime.  . Fluocinolone Acetonide Body 0.01 % OIL Apply 1 ml on the skin daily  . FLUOCINOLONE ACETONIDE SCALP 0.01 % OIL apply to affected area ON SCALP EVERY NIGHT AT BEDTIME, [place] UPON AWAKENING  . fluticasone (CUTIVATE) 0.05 % cream apply to affected area ON FACE UP TO 2 TIMES A DAY AS NEEDED  . fluticasone (FLONASE) 50 MCG/ACT nasal spray Place 1 spray into both nostrils daily. (Patient taking differently: Place 1 spray into both nostrils daily as needed for allergies. )  . glucose blood (FREESTYLE LITE) test strip Use as instructed  . glucose blood (ONETOUCH VERIO) test strip Use to check blood sugar 2 times per day. Dx code E11.65  . ketoconazole (NIZORAL) 2 % shampoo Apply 1 application topically 2 (two) times a week.  . Lancets (FREESTYLE) lancets Use as instructed to check blood sugar 2 times per day dx code E11.65  . levothyroxine (SYNTHROID, LEVOTHROID) 50 MCG tablet Take 1 tablet (50 mcg total) by mouth daily.  Marland Kitchen linagliptin (TRADJENTA) 5 MG TABS tablet TAKE 1 TABLET(5 MG) BY MOUTH DAILY  . loratadine (CLARITIN) 10 MG tablet Take 10 mg by mouth daily as needed for allergies.   . methocarbamol (ROBAXIN) 500 MG tablet Take 1 tablet (500 mg total) by mouth at bedtime as needed for muscle spasms.  . mupirocin ointment (BACTROBAN) 2 % Use as directed to affected areas (Patient taking differently: Apply 1 application topically  3 (three) times daily as needed (to affected areas). )  . Naftifine HCl (NAFTIN) 2 % CREA Apply to feet daily for fungal infection (Patient taking differently: Apply topically daily as needed (for fungal infection). To feet)  . nystatin-triamcinolone (MYCOLOG II) cream Apply 1 application topically 2 (two) times daily.  Marland Kitchen omeprazole (PRILOSEC) 40 MG capsule Take 1 capsule (40 mg total) by mouth daily.  . Probiotic Product (PROBIOTIC PO) Take 1 tablet by mouth daily.  Marland Kitchen RENVELA 800 MG tablet Take 800-1,600 mg by  mouth See admin instructions. Take 1 tablet with breakfast and at lunch then take 2 tablets at dinner and 800 mg with each snack  . simvastatin (ZOCOR) 20 MG tablet Take 20 mg by mouth at bedtime.   . triamcinolone ointment (KENALOG) 0.1 % Apply 1 application topically 2 (two) times daily. Use between thighs  . [DISCONTINUED] Insulin Pen Needle (NOVOFINE) 30G X 8 MM MISC Use to inject insulin 3 times daily  . [DISCONTINUED] lidocaine-prilocaine (EMLA) cream Apply 1 application topically every Monday, Wednesday, and Friday. AS DIRECTED  . [DISCONTINUED] metroNIDAZOLE (METROGEL) 0.75 % gel APPLY TO AFFECTED AREA ON SKIN  ON FACE TWICE DAILY  . [DISCONTINUED] midodrine (PROAMATINE) 10 MG tablet Take 10 mg by mouth 3 (three) times daily.  . [DISCONTINUED] ondansetron (ZOFRAN ODT) 4 MG disintegrating tablet Take 1 tablet (4 mg total) by mouth every 8 (eight) hours as needed for nausea or vomiting.   Facility-Administered Encounter Medications as of 02/25/2018  Medication  . 0.9 %  sodium chloride infusion  . dextrose 5 % solution    Allergies as of 02/25/2018 - Review Complete 02/25/2018  Allergen Reaction Noted  . Penicillins Rash     Past Medical History:  Diagnosis Date  . Allergy   . Anemia   . Arthritis   . Cataract    bil cateracts removed  . Coronary artery disease   . Diabetes mellitus    Type 2  . Diverticulitis   . ED (erectile dysfunction)   . 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 ESRD (end stage renal disease) on dialysis ([place]) 03/2015  . GERD (gastroesophageal reflux disease)    pepto   . Gout   . Hyperlipidemia   . Hypertension   . Hypothyroidism   . Pneumonia   . PVD (peripheral vascular disease) ([place])    has plastic aorta  . Renal insufficiency   . Seasonal allergies   . Shortness of breath dyspnea   . Sleep apnea    does not wear c-pap  . Thyroid disease     Past Surgical History:  Procedure Laterality Date  . aortobifemoral bypass    . AV FISTULA PLACEMENT  Left 12/01/2013   Procedure: ARTERIOVENOUS (AV) FISTULA CREATION- LEFT BRACHIOCEPHALIC;  Surgeon: Angelia Mould, MD;  Location: [facility];  Service: Vascular;  Laterality: Left;  . [place] TRANSPOSITION Right 07/27/2014   Procedure: BASCILIC VEIN TRANSPOSITION;  Surgeon: Angelia Mould, MD;  Location: [facility];  Service: Vascular;  Laterality: Right;  . BREAST SURGERY     left - granulomatous mastitis  . COLONOSCOPY    . 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 EYE SURGERY Bilateral    cataracts  . HEMODIALYSIS INPATIENT  01/17/2018      . REVISON OF ARTERIOVENOUS FISTULA Left 02/09/2014   Procedure: REVISON OF LEFT ARTERIOVENOUS FISTULA - RESECTION OF RENDUNDANT VEIN;  Surgeon: Angelia Mould, MD;  Location: [facility];  Service: Vascular;  Laterality: Left;  . SBO with lysis adhesions    . SHUNTOGRAM Left 04/19/2014   Procedure: FISTULOGRAM;  Surgeon: Angelia Mould, MD;  Location[facility] C[facility] LAB;  Service: Cardiovascular;  Laterality: Left;  . UNILATERAL UPPER EXTREMEITY ANGIOGRAM N/A 07/12/2014   Procedure: UNILATERAL UPPER Anselmo Rod;  Surgeon: Angelia Mould, MD;  Location: [facility] CATH LAB;  Service: Cardiovascular;  Laterality: N/A;    Family History  Problem Relation Age of Onset  . Aneurysm Mother   . Heart disease Father   . Stroke Father   . Hypertension Father   . Diabetes Father   . Dementia Neg Hx   . Colon cancer Neg Hx   . Esophageal cancer Neg Hx   . Pancreatic cancer Neg Hx   . Prostate cancer Neg Hx   . Rectal cancer Neg Hx   . Stomach cancer Neg Hx     Social History   Socioeconomic History  . Marital status: Married    Spouse name: [name] Mood  . Number of children: 1  . Years of education: 73  . Highest education level: Not on file  Occupational History  . Occupation: Retired  Scientific laboratory technician  . Financial resource strain: Not on file  . Food insecurity:    Worry: Not on file    Inability: Not on file  .  Transportation needs:    Medical: No    Non-medical: No  Tobacco Use  . Smoking status: Former Smoker    Last attempt to quit: 09/24/1994    Years since quitting: 23.4  . Smokeless tobacco: Never Used  Substance and Sexual Activity  . Alcohol use: No    Alcohol/week: 0.0 oz    Comment: Quit Oct. 1977 ("somewhat heavy")  . Drug use: No  . Sexual activity: Not on file  Lifestyle  . Physical activity:    Days per week: 3 days    Minutes per session: 10 min  . Stress: Not at all  Relationships  . Social connections:    Talks on phone: Not on file    Gets together: Not on file    Attends religious service: Not on file    Active member of club or organization: Not on file    Attends meetings of clubs or organizations: Not on file    Relationship status: Not on file  . Intimate partner violence:    Fear of current or ex partner: Not on file    Emotionally abused: Not on file    Physically abused: Not on file    Forced sexual activity: Not on file  Other Topics Concern  . Not on file  Social History Narrative   Lives at home with wife.   Caffeine use: Drinks no soda or tea.    Drinks 1 cup coffee/week   Right handed      Review of systems: Review of Systems  Constitutional: Negative for fever and chills.  HENT: Negative.   Eyes: Negative for blurred vision.  Respiratory: Negative for cough, shortness of breath and wheezing.   Cardiovascular: Negative for chest pain and palpitations.  Gastrointestinal: as per HPI Genitourinary: Negative for dysuria, urgency, frequency and hematuria.  Musculoskeletal: Negative for myalgias, back pain and joint pain.  Skin: Negative for itching and rash.  Neurological: Negative for dizziness, tremors, focal weakness, seizures and loss of consciousness.  Endo/Heme/Allergies: Positive for seasonal allergies.  Psychiatric/Behavioral: Negative for depression, suicidal ideas and hallucinations.  All other systems reviewed and are  negative.   Physical Exam: Vitals:   02/25/18 1342  Pulse: (!) 45  SpO2: 99%   Body mass index is 21.94 kg/m. Gen:      No acute distress HEENT:  EOMI, sclera anicteric Neck:     No masses; no thyromegaly Lungs:    Clear to auscultation bilaterally; normal respiratory effort CV:         Regular rate and rhythm; no murmurs Abd:      + bowel sounds; soft, non-tender; no palpable masses, no distension Ext:    No edema; adequate peripheral perfusion Skin:      Warm and dry; no rash Neuro: alert and oriented x 3 Psych: normal mood and affect  Data Reviewed:  Reviewed labs, radiology imaging, old records and pertinent past GI work up   Assessment and Plan/Recommendations:  70 yr M with ESRD on HD, peripheral vascular disease, insulin dependent DM here with c/o decreased appetite, abdominal fullness, bloating and constipation Loss of appetite and weight loss multifactorial: likely related to ESRD and multiple co-morbidities Advised patient to eat small frequent meals, avoid periods of prolonged starvation. Drink Nepro 2-3 times a day on HD days as he is unable to eat.  Schedule for gastric emptying scan  Constipation: Start Miralax daily 1 capful and titrate as needed to have 1-2 soft BM daily Benefiber 1 tablespoon 2-3 times daily Advised patient to discuss with nephrologist if he doesn't need to be on fluid restriction, increase fluid intake to 8 cups of water daily  Colon polyps: non advanced adenomas, no recall colonoscopy due to age >62   40 minutes was spent face-to-face with the patient. Greater than 50% of the time used for counseling as well as treatment plan and follow-up. Patient and wife had multiple questions which were answered to their satisfaction  K. Denzil Magnuson , MD [phone]    CC: Elby Showers, MD

## 2018-02-25 NOTE — Patient Instructions (Signed)
You have been scheduled for a gastric emptying scan at [facility] on 03/25/2018 at 7:30am. Please arrive at least 15 minutes prior to your appointment for registration. Please make certain not to have anything to eat or drink after midnight the night before your test. Hold all stomach medications (ex: Zofran, phenergan, Reglan) 8 hours prior to your test. If you need to reschedule your appointment, please contact radiology scheduling at [phone]. _____________________________________________________________________ A gastric-emptying study measures how long it takes for food to move through your stomach. There are several ways to measure stomach emptying. In the most common test, you eat food that contains a small amount of radioactive material. A scanner that detects the movement of the radioactive material is placed over your abdomen to monitor the rate at which food leaves your stomach. This test normally takes about 4 hours to complete. _____________________________________________________________________   Take Miralax 1 capful daily  Use Benefiber 1 tablespoon 2-3 times daily   Follow up in 3 months

## 2018-02-26 DIAGNOSIS — E1129 Type 2 diabetes mellitus with other diabetic kidney complication: Secondary | ICD-10-CM | POA: Diagnosis not present

## 2018-02-26 DIAGNOSIS — N2581 Secondary hyperparathyroidism of renal origin: Secondary | ICD-10-CM | POA: Diagnosis not present

## 2018-02-26 DIAGNOSIS — D631 Anemia in chronic kidney disease: Secondary | ICD-10-CM | POA: Diagnosis not present

## 2018-02-26 DIAGNOSIS — N186 End stage renal disease: Secondary | ICD-10-CM | POA: Diagnosis not present

## 2018-02-27 ENCOUNTER — Other Ambulatory Visit: Payer: Medicare Other

## 2018-02-28 ENCOUNTER — Encounter: Payer: Self-pay | Admitting: Gastroenterology

## 2018-02-28 DIAGNOSIS — E1129 Type 2 diabetes mellitus with other diabetic kidney complication: Secondary | ICD-10-CM | POA: Diagnosis not present

## 2018-02-28 DIAGNOSIS — D631 Anemia in chronic kidney disease: Secondary | ICD-10-CM | POA: Diagnosis not present

## 2018-02-28 DIAGNOSIS — N186 End stage renal disease: Secondary | ICD-10-CM | POA: Diagnosis not present

## 2018-02-28 DIAGNOSIS — N2581 Secondary hyperparathyroidism of renal origin: Secondary | ICD-10-CM | POA: Diagnosis not present

## 2018-03-03 DIAGNOSIS — N2581 Secondary hyperparathyroidism of renal origin: Secondary | ICD-10-CM | POA: Diagnosis not present

## 2018-03-03 DIAGNOSIS — D631 Anemia in chronic kidney disease: Secondary | ICD-10-CM | POA: Diagnosis not present

## 2018-03-03 DIAGNOSIS — N186 End stage renal disease: Secondary | ICD-10-CM | POA: Diagnosis not present

## 2018-03-03 DIAGNOSIS — E1129 Type 2 diabetes mellitus with other diabetic kidney complication: Secondary | ICD-10-CM | POA: Diagnosis not present

## 2018-03-04 ENCOUNTER — Encounter: Payer: Medicare Other | Attending: Physician Assistant | Admitting: Physician Assistant

## 2018-03-04 ENCOUNTER — Ambulatory Visit: Payer: Medicare Other | Admitting: Endocrinology

## 2018-03-04 DIAGNOSIS — E1122 Type 2 diabetes mellitus with diabetic chronic kidney disease: Secondary | ICD-10-CM | POA: Diagnosis not present

## 2018-03-04 DIAGNOSIS — N2581 Secondary hyperparathyroidism of renal origin: Secondary | ICD-10-CM | POA: Diagnosis not present

## 2018-03-04 DIAGNOSIS — Z87891 Personal history of nicotine dependence: Secondary | ICD-10-CM | POA: Diagnosis not present

## 2018-03-04 DIAGNOSIS — M109 Gout, unspecified: Secondary | ICD-10-CM | POA: Diagnosis not present

## 2018-03-04 DIAGNOSIS — E785 Hyperlipidemia, unspecified: Secondary | ICD-10-CM | POA: Diagnosis not present

## 2018-03-04 DIAGNOSIS — N186 End stage renal disease: Secondary | ICD-10-CM | POA: Insufficient documentation

## 2018-03-04 DIAGNOSIS — I120 Hypertensive chronic kidney disease with stage 5 chronic kidney disease or end stage renal disease: Secondary | ICD-10-CM | POA: Insufficient documentation

## 2018-03-04 DIAGNOSIS — Z992 Dependence on renal dialysis: Secondary | ICD-10-CM | POA: Insufficient documentation

## 2018-03-04 DIAGNOSIS — E11622 Type 2 diabetes mellitus with other skin ulcer: Secondary | ICD-10-CM | POA: Insufficient documentation

## 2018-03-04 DIAGNOSIS — I714 Abdominal aortic aneurysm, without rupture: Secondary | ICD-10-CM | POA: Insufficient documentation

## 2018-03-04 DIAGNOSIS — L89153 Pressure ulcer of sacral region, stage 3: Secondary | ICD-10-CM | POA: Insufficient documentation

## 2018-03-04 DIAGNOSIS — E039 Hypothyroidism, unspecified: Secondary | ICD-10-CM | POA: Insufficient documentation

## 2018-03-05 DIAGNOSIS — D631 Anemia in chronic kidney disease: Secondary | ICD-10-CM | POA: Diagnosis not present

## 2018-03-05 DIAGNOSIS — E1129 Type 2 diabetes mellitus with other diabetic kidney complication: Secondary | ICD-10-CM | POA: Diagnosis not present

## 2018-03-05 DIAGNOSIS — N186 End stage renal disease: Secondary | ICD-10-CM | POA: Diagnosis not present

## 2018-03-05 DIAGNOSIS — N2581 Secondary hyperparathyroidism of renal origin: Secondary | ICD-10-CM | POA: Diagnosis not present

## 2018-03-07 DIAGNOSIS — D631 Anemia in chronic kidney disease: Secondary | ICD-10-CM | POA: Diagnosis not present

## 2018-03-07 DIAGNOSIS — E1129 Type 2 diabetes mellitus with other diabetic kidney complication: Secondary | ICD-10-CM | POA: Diagnosis not present

## 2018-03-07 DIAGNOSIS — N2581 Secondary hyperparathyroidism of renal origin: Secondary | ICD-10-CM | POA: Diagnosis not present

## 2018-03-07 DIAGNOSIS — N186 End stage renal disease: Secondary | ICD-10-CM | POA: Diagnosis not present

## 2018-03-07 NOTE — Progress Notes (Signed)
[name], [name] ([id number]) Visit Report for 03/04/2018 Chief Complaint Document Details Patient Name: [name], [name]. Date of Service: 03/04/2018 2:45 PM Medical Record Number: [id number] Patient Account Number: [id number] Date of Birth/Sex: Nov 20, 1940 (76 y.o. M) Treating RN: Primary Care Provider: Tedra Senegal Other Clinician: Referring Provider: Tedra Senegal Treating Provider/Extender: [name], HOYT Weeks in Treatment: 3 Information Obtained from: Patient Chief Complaint Sacral pressure ulcer Electronic Signature(s) Signed: 03/04/2018 5:32:46 PM By: Worthy Keeler PA-C Entered By: Worthy Keeler on 03/04/2018 17:20:13 [name], [name] ([id number]) -------------------------------------------------------------------------------- HPI Details Patient Name: [name]. Date of Service: 03/04/2018 2:45 PM Medical Record Number: [id number] Patient Account Number: [id number] Date of Birth/Sex: 1940-10-12 (76 y.o. M) Treating RN: Primary Care Provider: Tedra Senegal Other Clinician: Referring Provider: Tedra Senegal Treating Provider/Extender: [name], HOYT Weeks in Treatment: 3 History of Present Illness HPI Description: 77 year old gentleman seen earlier this year in January for a abdominal wound is now back with the same problem which has recurred for about 2 weeks and he has been trying to apply some local antibiotic ointment and a Band-Aid there. Of note he has significant pressure in this area due to his beltline and may have put on some weight which results in his trousers being very tight around the waist. The else has changed in his HandP and his diabetes is pretty well controlled. most recent hemoglobin A1c on 12/06/2016 was 7.8%. he was reviewed by his endocrinologist Dr. Dwyane Dee who made appropriate modifications to his treatment plan. he was recently seen by neurology for mild cognitive impairment that was found to be normal for his age 37/21/18 patient appears to be doing  well in regard to his abdominal wound area on evaluation today. He has been tolerating the dressing changes with the [facility] Dressing I'm pleased with how this has progressed and in fact the wound appears to be completely healed on evaluation today. There is definitely no evidence of infection. 03/29/17 Patient's wound on the abdominal region appears to be completely healed on evaluation today. This wound has been doing very well and I think the biggest thing is going to be keeping it from reactivation where it rubbed on his pants. 04/18/2017 -- he was recently discharged about 3 weeks ago and was using a binder which probably caused him abrasion on the area of the recently healed scar. 05/02/17 on evaluation today patient appears to be doing fairly well in regard to his abdominal wound at the site of the surgical scar. Unfortunately the scar tissue is very fragile and tends to reopen. He did get a small abdominal binder to try to pad the area in order to offload and prevent his pants from rubbing specifically at the site. With that being said he tells me that Dr. Con Memos was concerned that the binder might actually have rub the area causing a reopening. I did have a look at this today and unfortunately it appears that he was putting this over his pants which was making matters worse. 05/09/17 on evaluation today patient continues to do well with the [facility] Dressing. There is a slight skin tear inferior to the wound that we have been treating that may have been due to the dressing sticking slightly. This is minimal however. I did discuss with patient prior to removal of the dressing for dressing changes that they whet this in order to loosen it and not risk damaging in the field. Nonetheless it appears that they have been doing very well with  the dressing changes he had his wife took over the dressing changes as she states that he was doing it wrong. Overall though I'm pleased with how  things are progressing. No fevers, chills, nausea, or vomiting noted at this time. Readmission: 08/27/17 on evaluation today patient appears to be doing similarly to how he was doing prior to healing previously August 2018 when we previously discharged him. He has fortunately been doing well up until just a couple of weeks ago and he had some of the [facility] Dressing over so he began to treat this on his own. He states that this has gotten much smaller but he ran out of supplies and therefore made an appointment to come back in for evaluation. No fevers, chills, nausea, or vomiting noted at this time. He has no discomfort at this point in regard to the wound. He does tell me that he has one maybe two compression fractures in his lower back and that he is going to be seen by the physician at [facility] imaging, Dr. Estanislado Pandy. ====== Old notes The 77 year old gentleman who has a past medical history of diabetes mellitus, end-stage renal disease on dialysis, hypertension, secondary hyperparathyroidism, peripheral vascular disease and history of previous arterial bifemoral bypass graft in 1992 also has a history of gout, hyperlipidemia, hypothyroidism. last hemoglobin A1c was 7.5%. status post AV fistula Weldin, CLARANCE BOLLARD. ([id number]) placement, breast surgery for left granulomatous mastitis, triple a repair in 1992, small bowel obstruction, upper arm angiograms for AV fistula on the left side. was noted to have a superficial skin wound in the periumbilical area since late December 2017. He was asked to clean it with hydrogen peroxide and apply Bactroban. He was seen by vascular surgery in October 2017 where his right ABI was 0.56 left ABI was 0.87 and right TBI was 0.69 and left TBI was 0.70 ===== 09/25/16; patient was previously a patient of Dr. Ardeen Garland who has recurrent skin breakdown in the area of the large incision that was initially 4 apparently an open AAA aneurysm repair. He had  secondary surgery but I'm not sure of what etiology this was. He is been using Hydrofera Blue to the open wounds in the mid abdominal surgical incision. This is now closed over. I think he also uses this area as his waist for his pants and I've asked him to adjust this Readmission: 02/11/18 on evaluation today patient presents for evaluation concerning a sacral pressure ulcer which has been present for several weeks. He states that he actually had several areas that were open there appears just be one remaining. He has been tolerating the dressing changes is wise been using Hydrofera Blue Dressing as left over from his abdominal ulcer that we have previously treated. Fortunately there does not appear to be any evidence of infection which is good news. With that being said he states that he's been having issues at one point even at dialysis he had to discontinue dialysis and go home due to the pain. He has not been using any cushion to sit on although he states he does have one that he can use. No fevers, chills, nausea, or vomiting noted at this time. He had a significant issue with diarrhea prior to this occurring and I believe this may have had some contributing factor as well. 02/18/18 on evaluation today patient's wound actually appears to be doing about the same. Unfortunately he really has not noted any significant improvement over last week he still is very tender even  just to light touch unfortunately over the periwound region. There does not appear to be any evidence of infection which is good news. He is especially tender at the 12 o'clock location even a full fingers with away from the actual wound site. There still slough covering the surface of the wound. 03/04/18 on evaluation today patient's wound actually appears to show signs of great improvement compared to last week's evaluation. He is definitely showing improvement week by week which I think is great news. Obviously that is what we  are looking for. Nonetheless I did not even need to perform any sharp debridement today being that there was no slough noted on the surface of the wound. His blood pressure continues to remain low although this is something that is normal. That is for him. Electronic Signature(s) Signed: 03/04/2018 5:32:46 PM By: Worthy Keeler PA-C Entered By: Worthy Keeler on 03/04/2018 17:20:27 [name], [name] ([id number]) -------------------------------------------------------------------------------- Physical Exam Details Patient Name: [name]. Date of Service: 03/04/2018 2:45 PM Medical Record Number: [id number] Patient Account Number: [id number] Date of Birth/Sex: [birth year] (76 y.o. M) Treating RN: Primary Care Provider: Tedra Senegal Other Clinician: Referring Provider: Tedra Senegal Treating Provider/Extender: STONE III, HOYT Weeks in Treatment: 3 Constitutional Thin and well-hydrated in no acute distress. Respiratory normal breathing without difficulty. clear to auscultation bilaterally. Cardiovascular regular rate and rhythm with normal S1, S2. Psychiatric this patient is able to make decisions and demonstrates good insight into disease process. Alert and Oriented x 3. pleasant and cooperative. Notes Patient continues to have discomfort on the surface of the wound with cleansing although I was able to cleanse this with saline and gauze without complication post mechanical debridement the wound bed appears to be excellent and again it is smaller. I think this will continue to show signs of good improvement in my pinion. Electronic Signature(s) Signed: 03/04/2018 5:32:46 PM By: Worthy Keeler PA-C Entered By: Worthy Keeler on 03/04/2018 17:21:02 [name], [name] ([id number]) -------------------------------------------------------------------------------- Physician Orders Details Patient Name: [name]. Date of Service: 03/04/2018 2:45 PM Medical Record Number:  [id number] Patient Account Number: [id number] Date of Birth/Sex: [birth year] (76 y.o. M) Treating RN: Ahmed Prima Primary Care Provider: Tedra Senegal Other Clinician: Referring Provider: Tedra Senegal Treating Provider/Extender: [name], HOYT Weeks in Treatment: 3 Verbal / Phone Orders: Yes ClinicianCarolyne Fiscal, Debi Read Back and Verified: Yes Diagnosis Coding ICD-10 Coding Code Description L89.153 Pressure ulcer of sacral region, stage 3 E11.622 Type 2 diabetes mellitus with other skin ulcer Z99.2 Dependence on renal dialysis Wound Cleansing Wound #5 Sacrum o Clean wound with Normal Saline. Anesthetic (add to Medication List) Wound #5 Sacrum o Topical Lidocaine 4% cream applied to wound bed prior to debridement (In Clinic Only). o Benzocaine Topical Anesthetic Spray applied to wound bed prior to debridement (In Clinic Only). Skin Barriers/Peri-Wound Care Wound #5 Sacrum o Skin Prep Primary Wound Dressing Wound #5 Sacrum o Collagen - lightly moisten with saline Secondary Dressing Wound #5 Sacrum o Boardered Foam Dressing Dressing Change Frequency Wound #5 Sacrum o Change Dressing Monday, Wednesday, Friday Follow-up Appointments Wound #5 Sacrum o Return Appointment in 1 week. Off-Loading Wound #5 Sacrum o Other: - Take cushion to dialysis for offloading Additional Orders / Instructions [name], [name] ([id number]) Wound #5 Sacrum o Other: - take BP on left lower arm with the green cuff Patient Medications Allergies: penicillin Notifications Medication Indication Start End lidocaine DOSE 1 - topical 4 % cream - 1 cream topical Electronic Signature(s) Signed:  03/04/2018 4:32:24 PM By: Alric Quan Signed: 03/04/2018 5:32:46 PM By: Worthy Keeler PA-C Entered By: Alric Quan on 03/04/2018 15:54:02 [name], [name] ([id number]) -------------------------------------------------------------------------------- Prescription  03/04/2018 Patient Name: [name]. Provider: Worthy Keeler PA-C Date of Birth: 03-Aug-1941 NPI#: [id number] Sex: Jerilynn Mages DEA#: [id number] Phone #: [phone] License #: Patient Address: [facility] [place], [place] [address] [address], [place], [place] [address] [phone] Allergies penicillin Medication Medication: Route: Strength: Form: lidocaine 4 % topical cream topical 4% cream Class: TOPICAL LOCAL ANESTHETICS Dose: Frequency / Time: Indication: 1 1 cream topical Number of Refills: Number of Units: 0 Generic Substitution: Start Date: End Date: One Time Use: Substitution Permitted No Note to Pharmacy: Signature(s): Date(s): Electronic Signature(s) Signed: 03/04/2018 4:32:24 PM By: Alric Quan Signed: 03/04/2018 5:32:46 PM By: Worthy Keeler PA-C Entered By: Alric Quan on 03/04/2018 15:54:03 [name], [name] ([id number]) --------------------------------------------------------------------------------  Problem List Details Patient Name: [name]. Date of Service: 03/04/2018 2:45 PM Medical Record Number: [id number] Patient Account Number: [id number] Date of Birth/Sex: Apr 04, 1941 (76 y.o. M) Treating RN: Primary Care Provider: Tedra Senegal Other Clinician: Referring Provider: Tedra Senegal Treating Provider/Extender: [name], HOYT Weeks in Treatment: 3 Active Problems ICD-10 Impacting Encounter Code Description Active Date Wound Healing Diagnosis L89.153 Pressure ulcer of sacral region, stage 3 02/11/2018 No Yes E11.622 Type 2 diabetes mellitus with other skin ulcer 02/11/2018 No Yes Z99.2 Dependence on renal dialysis 02/11/2018 No Yes Inactive Problems Resolved Problems Electronic Signature(s) Signed: 03/04/2018 5:32:46 PM By: Worthy Keeler PA-C Entered By: Worthy Keeler on 03/04/2018 17:19:52 [name], [name]  ([id number]) -------------------------------------------------------------------------------- Progress Note Details Patient Name: [name]. Date of Service: 03/04/2018 2:45 PM Medical Record Number: [id number] Patient Account Number: [id number] Date of Birth/Sex: [birth year] (76 y.o. M) Treating RN: Primary Care Provider: Tedra Senegal Other Clinician: Referring Provider: Tedra Senegal Treating Provider/Extender: [name], HOYT Weeks in Treatment: 3 Subjective Chief Complaint Information obtained from Patient Sacral pressure ulcer History of Present Illness (HPI) 77 year old gentleman seen earlier this year in January for a abdominal wound is now back with the same problem which has recurred for about 2 weeks and he has been trying to apply some local antibiotic ointment and a Band-Aid there. Of note he has significant pressure in this area due to his beltline and may have put on some weight which results in his trousers being very tight around the waist. The else has changed in his HandP and his diabetes is pretty well controlled. most recent hemoglobin A1c on 12/06/2016 was 7.8%. he was reviewed by his endocrinologist Dr. Dwyane Dee who made appropriate modifications to his treatment plan. he was recently seen by neurology for mild cognitive impairment that was found to be normal for his age 80/21/18 patient appears to be doing well in regard to his abdominal wound area on evaluation today. He has been tolerating the dressing changes with the [facility] Dressing I'm pleased with how this has progressed and in fact the wound appears to be completely healed on evaluation today. There is definitely no evidence of infection. 03/29/17 Patient's wound on the abdominal region appears to be completely healed on evaluation today. This wound has been doing very well and I think the biggest thing is going to be keeping it from reactivation where it rubbed on his pants. 04/18/2017 -- he was recently  discharged about 3 weeks ago and was using a binder which probably  caused him abrasion on the area of the recently healed scar. 05/02/17 on evaluation today patient appears to be doing fairly well in regard to his abdominal wound at the site of the surgical scar. Unfortunately the scar tissue is very fragile and tends to reopen. He did get a small abdominal binder to try to pad the area in order to offload and prevent his pants from rubbing specifically at the site. With that being said he tells me that Dr. Con Memos was concerned that the binder might actually have rub the area causing a reopening. I did have a look at this today and unfortunately it appears that he was putting this over his pants which was making matters worse. 05/09/17 on evaluation today patient continues to do well with the [facility] Dressing. There is a slight skin tear inferior to the wound that we have been treating that may have been due to the dressing sticking slightly. This is minimal however. I did discuss with patient prior to removal of the dressing for dressing changes that they whet this in order to loosen it and not risk damaging in the field. Nonetheless it appears that they have been doing very well with the dressing changes he had his wife took over the dressing changes as she states that he was doing it wrong. Overall though I'm pleased with how things are progressing. No fevers, chills, nausea, or vomiting noted at this time. Readmission: 08/27/17 on evaluation today patient appears to be doing similarly to how he was doing prior to healing previously August 2018 when we previously discharged him. He has fortunately been doing well up until just a couple of weeks ago and he had some of the [facility] Dressing over so he began to treat this on his own. He states that this has gotten much smaller but he ran out of supplies and therefore made an appointment to come back in for evaluation. No fevers, chills,  nausea, or vomiting noted at this time. He has no discomfort at this point in regard to the wound. He does tell me that he has one maybe two compression fractures in his lower back and that he is going to be seen by the physician at [facility] imaging, Dr. Estanislado Pandy. Golphin, [name] SOLOWAY ([id number]) ====== Old notes The 77 year old gentleman who has a past medical history of diabetes mellitus, end-stage renal disease on dialysis, hypertension, secondary hyperparathyroidism, peripheral vascular disease and history of previous arterial bifemoral bypass graft in 1992 also has a history of gout, hyperlipidemia, hypothyroidism. last hemoglobin A1c was 7.5%. status post AV fistula placement, breast surgery for left granulomatous mastitis, triple a repair in 1992, small bowel obstruction, upper arm angiograms for AV fistula on the left side. was noted to have a superficial skin wound in the periumbilical area since late December 2017. He was asked to clean it with hydrogen peroxide and apply Bactroban. He was seen by vascular surgery in October 2017 where his right ABI was 0.56 left ABI was 0.87 and right TBI was 0.69 and left TBI was 0.70 ===== 09/25/16; patient was previously a patient of Dr. Ardeen Garland who has recurrent skin breakdown in the area of the large incision that was initially 4 apparently an open AAA aneurysm repair. He had secondary surgery but I'm not sure of what etiology this was. He is been using Hydrofera Blue to the open wounds in the mid abdominal surgical incision. This is now closed over. I think he also uses this area as his  waist for his pants and I've asked him to adjust this Readmission: 02/11/18 on evaluation today patient presents for evaluation concerning a sacral pressure ulcer which has been present for several weeks. He states that he actually had several areas that were open there appears just be one remaining. He has been tolerating the dressing changes is wise been using  Hydrofera Blue Dressing as left over from his abdominal ulcer that we have previously treated. Fortunately there does not appear to be any evidence of infection which is good news. With that being said he states that he's been having issues at one point even at dialysis he had to discontinue dialysis and go home due to the pain. He has not been using any cushion to sit on although he states he does have one that he can use. No fevers, chills, nausea, or vomiting noted at this time. He had a significant issue with diarrhea prior to this occurring and I believe this may have had some contributing factor as well. 02/18/18 on evaluation today patient's wound actually appears to be doing about the same. Unfortunately he really has not noted any significant improvement over last week he still is very tender even just to light touch unfortunately over the periwound region. There does not appear to be any evidence of infection which is good news. He is especially tender at the 12 o'clock location even a full fingers with away from the actual wound site. There still slough covering the surface of the wound. 03/04/18 on evaluation today patient's wound actually appears to show signs of great improvement compared to last week's evaluation. He is definitely showing improvement week by week which I think is great news. Obviously that is what we are looking for. Nonetheless I did not even need to perform any sharp debridement today being that there was no slough noted on the surface of the wound. His blood pressure continues to remain low although this is something that is normal. That is for him. Patient History Information obtained from Patient. Family History Diabetes - Father,Paternal Grandparents, Heart Disease - Father, Hypertension - Father, No family history of Cancer, Hereditary Spherocytosis, Kidney Disease, Lung Disease, Seizures, Stroke, Thyroid Problems, Tuberculosis. Social History Former smoker  - quit in 1996, Marital Status - Married, Alcohol Use - Never - quit in 1976, Drug Use - No History, Caffeine Use - Never. 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 Cyphers, [name] ([id number]) Review of Systems (ROS) Constitutional Symptoms (General Health) Denies complaints or symptoms of Fever, Chills. Respiratory The patient has no complaints or symptoms. Cardiovascular The patient has no complaints or symptoms. Psychiatric The patient has no complaints or symptoms. Objective Constitutional Thin and well-hydrated in no acute distress. Vitals Time Taken: 3:15 PM, Height: 71 in, Weight: 177 lbs, BMI: 24.7, Temperature: 98.0 F, Pulse: 80 bpm, Respiratory Rate: 14 breaths/min, Blood Pressure: 88/34 mmHg. General Notes: Claretha Cooper notified of low systolic BP pt has problems with low BP d/t dialysis Respiratory normal breathing without difficulty. clear to auscultation bilaterally. Cardiovascular regular rate and rhythm with normal S1, S2. Psychiatric this patient is able to make decisions and demonstrates good insight into disease process. Alert and Oriented x 3. pleasant and cooperative. General Notes: Patient continues to have discomfort on the surface of the wound with cleansing although I was able to cleanse this with saline and gauze without complication post mechanical debridement the wound bed appears to be excellent and again it  is smaller. I think this will continue to show signs of good improvement in my pinion. Integumentary (Hair, Skin) Wound #5 status is Open. Original cause of wound was Pressure Injury. The wound is located on the Sacrum. The wound measures 0.3cm length x 0.5cm width x 0.1cm depth; 0.118cm^2 area and 0.012cm^3 volume. There is Fat Layer (Subcutaneous Tissue) Exposed exposed. There is no tunneling or undermining noted. There is a medium amount of  serous drainage noted. The wound margin is flat and intact. There is large (67-100%) granulation within the wound bed. There is a small (1-33%) amount of necrotic tissue within the wound bed including Adherent Slough. The periwound skin appearance did not exhibit: Callus, Crepitus, Excoriation, Induration, Rash, Scarring, Dry/Scaly, Maceration, Atrophie Blanche, Cyanosis, Ecchymosis, Hemosiderin Staining, Mottled, Pallor, Rubor, Erythema. Periwound temperature was noted as No Abnormality. The periwound has tenderness on palpation. Assessment Keegan, DOYE MONTILLA ([id number]) Active Problems ICD-10 Pressure ulcer of sacral region, stage 3 Type 2 diabetes mellitus with other skin ulcer Dependence on renal dialysis Plan Wound Cleansing: Wound #5 Sacrum: Clean wound with Normal Saline. Anesthetic (add to Medication List): Wound #5 Sacrum: Topical Lidocaine 4% cream applied to wound bed prior to debridement (In Clinic Only). Benzocaine Topical Anesthetic Spray applied to wound bed prior to debridement (In Clinic Only). Skin Barriers/Peri-Wound Care: Wound #5 Sacrum: Skin Prep Primary Wound Dressing: Wound #5 Sacrum: Collagen - lightly moisten with saline Secondary Dressing: Wound #5 Sacrum: Boardered Foam Dressing Dressing Change Frequency: Wound #5 Sacrum: Change Dressing Monday, Wednesday, Friday Follow-up Appointments: Wound #5 Sacrum: Return Appointment in 1 week. Off-Loading: Wound #5 Sacrum: Other: - Take cushion to dialysis for offloading Additional Orders / Instructions: Wound #5 Sacrum: Other: - take BP on left lower arm with the green cuff The following medication(s) was prescribed: lidocaine topical 4 % cream 1 1 cream topical was prescribed at facility We will make changes to the patient's dressing orders per above for the next week. I think this will be more appropriate form at this point as I do not feel the Iodoflex is necessary any longer. Hopefully the collagen will  be of benefit. Subsequently in regard to his blood pressure this seems to be his normal and I do not think any intervention needs to be started at this point. My biggest suggestion was that I do believe the patient needs to continue just to be monitored utilizing the small green cough that we have on the Dyna map. This seemed to get a much better reading compared to the large cup which I think does not fit them in all the form which is where were having to take his blood pressure. We will make note of this for future. Please see above for specific wound care orders. We will see patient for re-evaluation in 2 week(s) here in the clinic. If anything worsens or changes patient will contact our office for additional recommendations. CORLISS, LAMARTINA ([id number]) Electronic Signature(s) Signed: 03/04/2018 5:32:46 PM By: Worthy Keeler PA-C Entered By: Worthy Keeler on 03/04/2018 17:22:12 Amacher, [name] ([id number]) -------------------------------------------------------------------------------- ROS/PFSH Details Patient Name: [name]. Date of Service: 03/04/2018 2:45 PM Medical Record Number: [id number] Patient Account Number: [id number] Date of Birth/Sex: [birth year] (76 y.o. M) Treating RN: Primary Care Provider: Tedra Senegal Other Clinician: Referring Provider: Tedra Senegal Treating Provider/Extender: [name], HOYT Weeks in Treatment: 3 Information Obtained From Patient Wound History Do you currently have one or more open woundso Yes How many open wounds do you currently haveo  1 Approximately how long have you had your woundso 3 or 4 weeks How have you been treating your wound(s) until nowo hydrafera blue Has your wound(s) ever healed and then re-openedo No Have you had any lab work done in the past montho No Have you tested positive for an antibiotic resistant organism (MRSA, VRE)o No Have you tested positive for osteomyelitis (bone infection)o No Have you had any tests for  circulation on your legso No Constitutional Symptoms (General Health) Complaints and Symptoms: Negative for: Fever; Chills Eyes Medical History: Negative for: Cataracts; Glaucoma; Optic Neuritis Ear/Nose/Mouth/Throat Medical History: Negative for: Chronic sinus problems/congestion; Middle ear problems Hematologic/Lymphatic Medical History: Positive for: Anemia Negative for: Hemophilia; Human Immunodeficiency Virus; Lymphedema; Sickle Cell Disease Respiratory Complaints and Symptoms: No Complaints or Symptoms Cardiovascular Complaints and Symptoms: No Complaints or Symptoms Medical History: Positive for: Hypertension; Peripheral Venous Disease Negative for: Angina; Arrhythmia; Congestive Heart Failure; Coronary Artery Disease; Deep Vein Thrombosis; Hypotension; Myocardial Infarction; Peripheral Arterial Disease; Phlebitis; Vasculitis Past Medical History Notes: TATEJordi, 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, hyperlipidemia Gastrointestinal Medical History: Negative for: Cirrhosis ; Colitis; Crohnos; Hepatitis A; Hepatitis B; Hepatitis C Past Medical History Notes: scar tissue from multiple surgeries Endocrine Medical History: Positive for: Type II Diabetes Negative for: Type I Diabetes Treated with: Insulin Genitourinary Medical History: 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 Immunological Medical History: Negative for: Lupus Erythematosus; Raynaudos; Scleroderma Integumentary (Skin) Medical History: Negative for: History of Burn; History of pressure wounds Musculoskeletal Medical History: Positive for: Gout Negative for: Rheumatoid Arthritis; Osteoarthritis; Osteomyelitis Neurologic Medical History: Positive for: Neuropathy Negative for: Dementia; Quadriplegia; Paraplegia; Seizure Disorder Oncologic Medical History: Negative for: Received Chemotherapy; Received Radiation Psychiatric Complaints and Symptoms: No Complaints or Symptoms Medical  History: Negative for: Anorexia/bulimia; Confinement Anxiety Immunizations Pneumococcal Vaccine: Stogdill, [name] ([id number]) Received Pneumococcal Vaccination: Yes Immunization Notes: uo to date Implantable Devices Family and Social History Cancer: No; Diabetes: Yes - Father,Paternal Grandparents; Heart Disease: Yes - Father; Hereditary Spherocytosis: No; Hypertension: Yes - Father; Kidney Disease: No; Lung Disease: No; Seizures: No; Stroke: No; Thyroid Problems: No; Tuberculosis: No; Former smoker - quit in 1996; Marital Status - Married; Alcohol Use: Never - quit in 1976; Drug Use: No History; Caffeine Use: Never; Financial Concerns: No; Food, Clothing or Shelter Needs: No; Support System Lacking: No; Transportation Concerns: No; Advanced Directives: No; Patient does not want information on Advanced Directives Physician Affirmation I have reviewed and agree with the above information. Electronic Signature(s) Signed: 03/04/2018 5:32:46 PM By: Worthy Keeler PA-C Entered By: Worthy Keeler on 03/04/2018 17:20:46 Awad, [name] ([id number]) -------------------------------------------------------------------------------- [place] Details Patient Name: [name]. Date of Service: 03/04/2018 Medical Record Number: [id number] Patient Account Number: [id number] Date of Birth/Sex: [birth year] (76 y.o. 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

## 2018-03-07 NOTE — Progress Notes (Signed)
Johnny, [name] ([id number]) Visit Report for 03/04/2018 Arrival Information Details Patient Name: [name], [name]. Date of Service: 03/04/2018 2:45 PM Medical Record Number: [id number] Patient Account Number: [id number] Date of Birth/Sex: [birth year] (76 y.o. M) Treating RN: [name] Primary Care [name]: [name] Other Clinician: Referring [name]: [name] Treating [name]/Extender: [name], [name] in Treatment: 3 Visit Information History Since Last Visit Added or deleted any medications: No Patient Arrived: Walker Any new allergies or adverse reactions: No Arrival Time: 15:14 Had a fall or experienced change in No Accompanied By: self activities of daily living that may affect Transfer Assistance: None risk of falls: Patient Identification Verified: Yes Signs or symptoms of abuse/neglect since last visito No Secondary Verification Process Yes Hospitalized since last visit: No Completed: Implantable device outside of the clinic excluding No Patient Has Alerts: Yes cellular tissue based products placed in the center Patient Alerts: Patient on Blood since last visit: Thinner Has Dressing in Place as Prescribed: Yes Pain Present Now: No Electronic Signature(s) Signed: 03/04/2018 3:48:56 PM By: [name] Entered By: [name] on 03/04/2018 15:14:54 Dismuke, [name] ([id number]) -------------------------------------------------------------------------------- Clinic Level of Care Assessment Details Patient Name: [name]. Date of Service: 03/04/2018 2:45 PM Medical Record Number: [id number] Patient Account Number: [id number] Date of Birth/Sex: 1940-09-30 (76 y.o. M) Treating RN: [name] Primary Care Davinity [name]: [name] Other Clinician: Referring [name]: [name] Treating Ameliyah Sarno/Extender: [name], [name] in Treatment: 3 Clinic Level of Care Assessment Items TOOL 4 Quantity Score X - Use when only an EandM is  performed on FOLLOW-UP visit 1 0 ASSESSMENTS - Nursing Assessment / Reassessment X - Reassessment of Co-morbidities (includes updates in patient status) 1 10 X- 1 5 Reassessment of Adherence to Treatment Plan ASSESSMENTS - Wound and Skin Assessment / Reassessment X - Simple Wound Assessment / Reassessment - one wound 1 5 []  - 0 Complex Wound Assessment / Reassessment - multiple wounds []  - 0 Dermatologic / Skin Assessment (not related to wound area) ASSESSMENTS - Focused Assessment []  - Circumferential Edema Measurements - multi extremities 0 []  - 0 Nutritional Assessment / Counseling / Intervention []  - 0 Lower Extremity Assessment (monofilament, tuning fork, pulses) []  - 0 Peripheral Arterial Disease Assessment (using hand held doppler) ASSESSMENTS - Ostomy and/or Continence Assessment and Care []  - Incontinence Assessment and Management 0 []  - 0 Ostomy Care Assessment and Management (repouching, etc.) PROCESS - Coordination of Care X - Simple Patient / Family Education for ongoing care 1 15 []  - 0 Complex (extensive) Patient / Family Education for ongoing care []  - 0 Staff obtains Programmer, systems, Records, Test Results / Process Orders []  - 0 Staff telephones HHA, Nursing Homes / Clarify orders / etc []  - 0 Routine Transfer to another Facility (non-emergent condition) []  - 0 Routine Hospital Admission (non-emergent condition) []  - 0 New Admissions / Biomedical engineer / Ordering NPWT, Apligraf, etc. []  - 0 Emergency Hospital Admission (emergent condition) X- 1 10 Simple Discharge Coordination Sporer, [name] ([id number]) []  - 0 Complex (extensive) Discharge Coordination PROCESS - Special Needs []  - Pediatric / Minor Patient Management 0 []  - 0 Isolation Patient Management []  - 0 Hearing / Language / Visual special needs []  - 0 Assessment of Community assistance (transportation, D/C planning, etc.) []  - 0 Additional assistance / Altered mentation []  - 0 Support  Surface(s) Assessment (bed, cushion, seat, etc.) INTERVENTIONS - Wound Cleansing / Measurement X - Simple Wound Cleansing - one wound 1 5 []  - 0  Complex Wound Cleansing - multiple wounds X- 1 5 Wound Imaging (photographs - any number of wounds) []  - 0 Wound Tracing (instead of photographs) X- 1 5 Simple Wound Measurement - one wound []  - 0 Complex Wound Measurement - multiple wounds INTERVENTIONS - Wound Dressings X - Small Wound Dressing one or multiple wounds 1 10 []  - 0 Medium Wound Dressing one or multiple wounds []  - 0 Large Wound Dressing one or multiple wounds X- 1 5 Application of Medications - topical []  - 0 Application of Medications - injection INTERVENTIONS - Miscellaneous []  - External ear exam 0 []  - 0 Specimen Collection (cultures, biopsies, blood, body fluids, etc.) []  - 0 Specimen(s) / Culture(s) sent or taken to Lab for analysis []  - 0 Patient Transfer (multiple staff / Civil Service fast streamer / Similar devices) []  - 0 Simple Staple / Suture removal (25 or less) []  - 0 Complex Staple / Suture removal (26 or more) []  - 0 Hypo / Hyperglycemic Management (close monitor of Blood Glucose) []  - 0 Ankle / Brachial Index (ABI) - do not check if billed separately X- 1 5 Vital Signs Pires, [name] ([id number]) Has the patient been seen at the hospital within the last three years: Yes Total Score: 80 Level Of Care: New/Established - Level 3 Electronic Signature(s) Signed: 03/04/2018 4:32:24 PM By: [name] Entered By: [name] on 03/04/2018 16:24:16 Bachtell, [name] ([id number]) -------------------------------------------------------------------------------- Encounter Discharge Information Details Patient Name: [name]. Date of Service: 03/04/2018 2:45 PM Medical Record Number: [id number] Patient Account Number: [id number] Date of Birth/Sex: 1941-01-03 (76 y.o. M) Treating RN: [name] Primary Care [name]: [name] Other  Clinician: Referring [name]: [name] Treating [name]/Extender: [name], [name] in Treatment: 3 Encounter Discharge Information Items Discharge Condition: Stable Ambulatory Status: Walker Discharge Destination: Home Transportation: Private Auto Schedule Follow-up Appointment: Yes Clinical Summary of Care: Electronic Signature(s) Signed: 03/04/2018 4:06:48 PM By: [name] Entered By: [name] on 03/04/2018 16:00:10 Gough, [name] ([id number]) -------------------------------------------------------------------------------- Lower Extremity Assessment Details Patient Name: [name]. Date of Service: 03/04/2018 2:45 PM Medical Record Number: [id number] Patient Account Number: [id number] Date of Birth/Sex: 01-Aug-1941 (76 y.o. M) Treating RN: [name] Primary Care Aubrina Nieman: [name] Other Clinician: Referring Julis Haubner: [name] Treating Zaylie Gisler/Extender: [name], [name] in Treatment: 3 Electronic Signature(s) Signed: 03/04/2018 3:48:56 PM By: [name] Entered By: [name] on 03/04/2018 15:16:15 Lonon, [name] ([id number]) -------------------------------------------------------------------------------- Multi Wound Chart Details Patient Name: [name]. Date of Service: 03/04/2018 2:45 PM Medical Record Number: [id number] Patient Account Number: [id number] Date of Birth/Sex: May 31, 1941 (76 y.o. M) Treating RN: [name] Primary Care Christabelle Hanzlik: [name] Other Clinician: Referring Athina Fahey: [name] Treating Tonyia Marschall/Extender: [name], [name] in Treatment: 3 Vital Signs Height(in): 71 Pulse(bpm): 80 Weight(lbs): 177 Blood Pressure(mmHg): 146/19 Body Mass Index(BMI): 25 Temperature(F): 98.0 Respiratory Rate 14 (breaths/min): Photos: [N/A:N/A] Wound Location: Sacrum N/A N/A Wounding Event: Pressure Injury N/A N/A Primary Etiology: Pressure Ulcer N/A N/A Comorbid History: Anemia,  Hypertension, N/A N/A Peripheral Venous Disease, Type II Diabetes, End Stage Renal Disease, Gout, Neuropathy Date Acquired: 01/21/2018 N/A N/A [name] of Treatment: 3 N/A N/A Wound Status: Open N/A N/A Measurements L x W x D 0.3x0.5x0.1 N/A N/A (cm) Area (cm) : 0.118 N/A N/A Volume (cm) : 0.012 N/A N/A % Reduction in Area: 69.40% N/A N/A % Reduction in Volume: 68.40% N/A N/A Classification: Category/Stage III N/A N/A Exudate Amount: Medium N/A N/A Exudate Type: Serous N/A N/A Exudate Color:  amber N/A N/A Wound Margin: Flat and Intact N/A N/A Granulation Amount: Large (67-100%) N/A N/A Necrotic Amount: Small (1-33%) N/A N/A Exposed Structures: Fat Layer (Subcutaneous N/A N/A Tissue) Exposed: Yes Fascia: No Tendon: No Muscle: No Henney, [name] ([id number]) Joint: No Bone: No Epithelialization: Small (1-33%) N/A N/A Periwound Skin Texture: Excoriation: No N/A N/A Induration: No Callus: No Crepitus: No Rash: No Scarring: No Periwound Skin Moisture: Maceration: No N/A N/A Dry/Scaly: No Periwound Skin Color: Atrophie Blanche: No N/A N/A Cyanosis: No Ecchymosis: No Erythema: No Hemosiderin Staining: No Mottled: No Pallor: No Rubor: No Temperature: No Abnormality N/A N/A Tenderness on Palpation: Yes N/A N/A Wound Preparation: Ulcer Cleansing: N/A N/A Rinsed/Irrigated with Saline Topical Anesthetic Applied: Other: lidocaine 4% Treatment Notes Electronic Signature(s) Signed: 03/04/2018 4:32:24 PM By: [name] Entered By: [name] on 03/04/2018 15:50:46 Cancio, [name] ([id number]) -------------------------------------------------------------------------------- [place] Details Patient Name: [name]. Date of Service: 03/04/2018 2:45 PM Medical Record Number: [id number] Patient Account Number: [id number] Date of Birth/Sex: 1940-12-01 (76 y.o. M) Treating RN: [name] Primary Care Kyran Whittier: [name] Other  Clinician: Referring Rafel Garde: [name] Treating Brogan Martis/Extender: [name], [name] in Treatment: 3 Active Inactive ` Orientation to the Wound Care Program Nursing Diagnoses: Knowledge deficit related to the wound healing center program Goals: Patient/caregiver will verbalize understanding of the [place] Program Date Initiated: 02/11/2018 Target Resolution Date: 02/21/2018 Goal Status: Active Interventions: Provide education on orientation to the wound center Notes: ` Pressure Nursing Diagnoses: Knowledge deficit related to management of pressures ulcers Goals: Patient will remain free from development of additional pressure ulcers Date Initiated: 02/11/2018 Target Resolution Date: 02/27/2018 Goal Status: Active Interventions: Assess: immobility, friction, shearing, incontinence upon admission and as needed Treatment Activities: Patient referred for home evaluation of offloading devices/mattresses : 02/11/2018 Notes: ` Wound/Skin Impairment Nursing Diagnoses: Impaired tissue integrity Goals: Ulcer/skin breakdown will have a volume reduction of 80% by week 12 Date Initiated: 02/11/2018 Target Resolution Date: 05/14/2018 [name], [name] ([id number]) Goal Status: Active Interventions: Assess ulceration(s) every visit Notes: Electronic Signature(s) Signed: 03/04/2018 4:32:24 PM By: [name] Entered By: [name] on 03/04/2018 15:50:34 Belasco, [name] ([id number]) -------------------------------------------------------------------------------- Pain Assessment Details Patient Name: [name]. Date of Service: 03/04/2018 2:45 PM Medical Record Number: [id number] Patient Account Number: [id number] Date of Birth/Sex: 01/30/1941 (76 y.o. M) Treating RN: [name] Primary Care Mikia Delaluz: [name] Other Clinician: Referring Zalan Shidler: [name] Treating Sherlyn Ebbert/Extender: [name], [name] in Treatment: 3 Active  Problems Location of Pain Severity and Description of Pain Patient Has Paino No Site Locations Pain Management and Medication Current Pain Management: Electronic Signature(s) Signed: 03/04/2018 3:48:56 PM By: [name] Entered By: [name] on 03/04/2018 15:15:02 Scholze, [name] ([id number]) -------------------------------------------------------------------------------- Patient/Caregiver Education Details Patient Name: [name]. Date of Service: 03/04/2018 2:45 PM Medical Record Number: [id number] Patient Account Number: [id number] Date of Birth/Gender: 07/21/1941 (76 y.o. M) Treating RN: [name] Primary Care Physician: [name] Other Clinician: Referring Physician: Tedra [name] Treating Physician/Extender: Sharalyn Ink in Treatment: 3 Education Assessment Education Provided To: Patient and Caregiver Education Topics Provided Wound/Skin Impairment: Handouts: Caring for Your Ulcer Methods: Explain/Verbal Responses: State content correctly Electronic Signature(s) Signed: 03/04/2018 4:06:48 PM By: [name] Entered By: [name] on 03/04/2018 16:00:25 Fancher, [name] ([id number]) -------------------------------------------------------------------------------- Wound Assessment Details Patient Name: [name]. Date of Service: 03/04/2018 2:45 PM Medical Record Number: [id number] Patient Account Number: [id number] Date of Birth/Sex: July 11, 1941 (76 y.o. M) Treating  RN: [name] Primary Care Churchill Grimsley: [name] Other Clinician: Referring Esaw Knippel: [name] Treating Brion Hedges/Extender: [name], [name] in Treatment: 3 Wound Status Wound Number: 5 Primary Pressure Ulcer Etiology: Wound Location: Sacrum Wound Open Wounding Event: Pressure Injury Status: Date Acquired: 01/21/2018 Comorbid Anemia, Hypertension, Peripheral Venous [name] Of Treatment: 3 History: Disease, Type II Diabetes, End Stage  Renal Clustered Wound: No Disease, Gout, Neuropathy Photos Photo Uploaded By: [name] on 03/04/2018 15:26:53 Wound Measurements Length: (cm) 0.3 Width: (cm) 0.5 Depth: (cm) 0.1 Area: (cm) 0.118 Volume: (cm) 0.012 % Reduction in Area: 69.4% % Reduction in Volume: 68.4% Epithelialization: Small (1-33%) Tunneling: No Undermining: No Wound Description Classification: Category/Stage III Wound Margin: Flat and Intact Exudate Amount: Medium Exudate Type: Serous Exudate Color: amber Foul Odor After Cleansing: No Slough/Fibrino Yes Wound Bed Granulation Amount: Large (67-100%) Exposed Structure Necrotic Amount: Small (1-33%) Fascia Exposed: No Necrotic Quality: Adherent Slough Fat Layer (Subcutaneous Tissue) Exposed: Yes Tendon Exposed: No Muscle Exposed: No Joint Exposed: No Bone Exposed: No Periwound Skin Texture Verastegui, [name] ([id number]) Texture Color No Abnormalities Noted: No No Abnormalities Noted: No Callus: No Atrophie Blanche: No Crepitus: No Cyanosis: No Excoriation: No Ecchymosis: No Induration: No Erythema: No Rash: No Hemosiderin Staining: No Scarring: No Mottled: No Pallor: No Moisture Rubor: No No Abnormalities Noted: No Dry / Scaly: No Temperature / Pain Maceration: No Temperature: No Abnormality Tenderness on Palpation: Yes Wound Preparation Ulcer Cleansing: Rinsed/Irrigated with Saline Topical Anesthetic Applied: Other: lidocaine 4%, Treatment Notes Wound #5 (Sacrum) 1. Cleansed with: Clean wound with Normal Saline 2. Anesthetic Topical Lidocaine 4% cream to wound bed prior to debridement 4. Dressing Applied: Prisma Ag 5. Secondary Dressing Applied Bordered Foam Dressing Electronic Signature(s) Signed: 03/04/2018 3:48:56 PM By: [name] Entered By: [name] on 03/04/2018 15:21:20 Schmoll, [name] ([id number]) -------------------------------------------------------------------------------- [place]  Details Patient Name: [name]. Date of Service: 03/04/2018 2:45 PM Medical Record Number: [id number] Patient Account Number: [id number] Date of Birth/Sex: [birth year] (76 y.o. M) Treating RN: [name] Primary Care Tanina Barb: [name] Other Clinician: Referring Judithe Keetch: [name] Treating Bonney Berres/Extender: [name], [name] in Treatment: 3 Vital Signs Time Taken: 15:15 Temperature (F): 98.0 Height (in): 71 Pulse (bpm): 80 Weight (lbs): 177 Respiratory Rate (breaths/min): 14 Body Mass Index (BMI): 24.7 Blood Pressure (mmHg): 88/34 Reference Range: 80 - 120 mg / dl Notes Hoyt PA-C notified of low systolic BP pt has problems with low BP d/t dialysis Electronic Signature(s) Signed: 03/04/2018 4:32:24 PM By: [name] Previous Signature: 03/04/2018 3:48:56 PM Version By: [name] Entered By: [name] on 03/04/2018 15:51:25

## 2018-03-10 ENCOUNTER — Other Ambulatory Visit: Payer: Self-pay | Admitting: Endocrinology

## 2018-03-10 DIAGNOSIS — D631 Anemia in chronic kidney disease: Secondary | ICD-10-CM | POA: Diagnosis not present

## 2018-03-10 DIAGNOSIS — E1129 Type 2 diabetes mellitus with other diabetic kidney complication: Secondary | ICD-10-CM | POA: Diagnosis not present

## 2018-03-10 DIAGNOSIS — N186 End stage renal disease: Secondary | ICD-10-CM | POA: Diagnosis not present

## 2018-03-10 DIAGNOSIS — N2581 Secondary hyperparathyroidism of renal origin: Secondary | ICD-10-CM | POA: Diagnosis not present

## 2018-03-10 NOTE — Telephone Encounter (Signed)
Patient wife stated patient need a refill of linagliptin (TRADJENTA) 5 MG, sent to the [place] (he only has 13 pill left) Fax# [phone]

## 2018-03-11 DIAGNOSIS — I959 Hypotension, unspecified: Secondary | ICD-10-CM | POA: Diagnosis not present

## 2018-03-12 ENCOUNTER — Other Ambulatory Visit: Payer: Self-pay | Admitting: Physician Assistant

## 2018-03-12 ENCOUNTER — Other Ambulatory Visit: Payer: Self-pay

## 2018-03-12 DIAGNOSIS — N2581 Secondary hyperparathyroidism of renal origin: Secondary | ICD-10-CM | POA: Diagnosis not present

## 2018-03-12 DIAGNOSIS — N186 End stage renal disease: Secondary | ICD-10-CM | POA: Diagnosis not present

## 2018-03-12 DIAGNOSIS — D631 Anemia in chronic kidney disease: Secondary | ICD-10-CM | POA: Diagnosis not present

## 2018-03-12 DIAGNOSIS — E1129 Type 2 diabetes mellitus with other diabetic kidney complication: Secondary | ICD-10-CM | POA: Diagnosis not present

## 2018-03-12 MED ORDER — LINAGLIPTIN 5 MG PO TABS: ORAL_TABLET | ORAL | 3 refills | Status: DC

## 2018-03-13 ENCOUNTER — Telehealth: Payer: Self-pay | Admitting: Endocrinology

## 2018-03-13 ENCOUNTER — Other Ambulatory Visit: Payer: Self-pay

## 2018-03-13 MED ORDER — LINAGLIPTIN 5 MG PO TABS: ORAL_TABLET | ORAL | 3 refills | Status: DC

## 2018-03-14 DIAGNOSIS — E1129 Type 2 diabetes mellitus with other diabetic kidney complication: Secondary | ICD-10-CM | POA: Diagnosis not present

## 2018-03-14 DIAGNOSIS — N2581 Secondary hyperparathyroidism of renal origin: Secondary | ICD-10-CM | POA: Diagnosis not present

## 2018-03-14 DIAGNOSIS — N186 End stage renal disease: Secondary | ICD-10-CM | POA: Diagnosis not present

## 2018-03-14 DIAGNOSIS — D631 Anemia in chronic kidney disease: Secondary | ICD-10-CM | POA: Diagnosis not present

## 2018-03-17 DIAGNOSIS — E1129 Type 2 diabetes mellitus with other diabetic kidney complication: Secondary | ICD-10-CM | POA: Diagnosis not present

## 2018-03-17 DIAGNOSIS — D631 Anemia in chronic kidney disease: Secondary | ICD-10-CM | POA: Diagnosis not present

## 2018-03-17 DIAGNOSIS — N186 End stage renal disease: Secondary | ICD-10-CM | POA: Diagnosis not present

## 2018-03-17 DIAGNOSIS — N2581 Secondary hyperparathyroidism of renal origin: Secondary | ICD-10-CM | POA: Diagnosis not present

## 2018-03-18 ENCOUNTER — Encounter: Payer: Medicare Other | Admitting: Physician Assistant

## 2018-03-18 DIAGNOSIS — I120 Hypertensive chronic kidney disease with stage 5 chronic kidney disease or end stage renal disease: Secondary | ICD-10-CM | POA: Diagnosis not present

## 2018-03-18 DIAGNOSIS — E1122 Type 2 diabetes mellitus with diabetic chronic kidney disease: Secondary | ICD-10-CM | POA: Diagnosis not present

## 2018-03-18 DIAGNOSIS — L89153 Pressure ulcer of sacral region, stage 3: Secondary | ICD-10-CM | POA: Diagnosis not present

## 2018-03-18 DIAGNOSIS — N186 End stage renal disease: Secondary | ICD-10-CM | POA: Diagnosis not present

## 2018-03-18 DIAGNOSIS — E11622 Type 2 diabetes mellitus with other skin ulcer: Secondary | ICD-10-CM | POA: Diagnosis not present

## 2018-03-18 DIAGNOSIS — N2581 Secondary hyperparathyroidism of renal origin: Secondary | ICD-10-CM | POA: Diagnosis not present

## 2018-03-19 DIAGNOSIS — N2581 Secondary hyperparathyroidism of renal origin: Secondary | ICD-10-CM | POA: Diagnosis not present

## 2018-03-19 DIAGNOSIS — N186 End stage renal disease: Secondary | ICD-10-CM | POA: Diagnosis not present

## 2018-03-19 DIAGNOSIS — E1129 Type 2 diabetes mellitus with other diabetic kidney complication: Secondary | ICD-10-CM | POA: Diagnosis not present

## 2018-03-19 DIAGNOSIS — D631 Anemia in chronic kidney disease: Secondary | ICD-10-CM | POA: Diagnosis not present

## 2018-03-20 NOTE — Progress Notes (Signed)
[name], [name] ([id number]) Visit Report for 03/18/2018 Arrival Information Details Patient Name: [name], [name]. Date of Service: 03/18/2018 2:45 PM Medical Record Number: [id number] Patient Account Number: [id number] Date of Birth/Sex: 01/12/1941 (77 y.o. M) Treating RN: [name] Primary Care [name]: [name] Other Clinician: Referring [name]: [name] Treating [name]/Extender: [name]: 5 Visit Information History Since Last Visit Added or deleted any medications: No Patient Arrived: Cane Any new allergies or adverse reactions: No Arrival Time: 15:22 Had a fall or experienced change in No Accompanied By: wife activities of daily living that may affect Transfer Assistance: None risk of falls: Patient Has Alerts: Yes Signs or symptoms of abuse/neglect since last visito No Patient Alerts: Patient on Blood Thinner Hospitalized since last visit: No Implantable device outside of the clinic excluding No cellular tissue based products placed in the center since last visit: Has Dressing in Place as Prescribed: Yes Pain Present Now: No Electronic Signature(s) Signed: 03/18/2018 4:32:29 PM By: [name] Entered By: [name] on 03/18/2018 15:23:18 [name], [name] ([id number]) -------------------------------------------------------------------------------- Clinic Level of Care Assessment Details Patient Name: [name]. Date of Service: 03/18/2018 2:45 PM Medical Record Number: [id number] Patient Account Number: [id number] Date of Birth/Sex: October 19, 1940 (77 y.o. M) Treating RN: [name] Primary Care [name]: [name] Other Clinician: Referring [name]: [name] Treating [name]: [name]: 5 Clinic Level of Care Assessment Items TOOL 4 Quantity Score []  - Use when only an EandM is performed on FOLLOW-UP visit 0 ASSESSMENTS - Nursing Assessment / Reassessment X - Reassessment of  Co-morbidities (includes updates in patient status) 1 10 X- 1 5 Reassessment of Adherence to [name] Plan ASSESSMENTS - Wound and Skin Assessment / Reassessment X - Simple Wound Assessment / Reassessment - one wound 1 5 []  - 0 Complex Wound Assessment / Reassessment - multiple wounds []  - 0 Dermatologic / Skin Assessment (not related to wound area) ASSESSMENTS - Focused Assessment []  - Circumferential Edema Measurements - multi extremities 0 []  - 0 Nutritional Assessment / Counseling / Intervention []  - 0 Lower Extremity Assessment (monofilament, tuning fork, pulses) []  - 0 Peripheral Arterial Disease Assessment (using hand held doppler) ASSESSMENTS - Ostomy and/or Continence Assessment and Care []  - Incontinence Assessment and Management 0 []  - 0 Ostomy Care Assessment and Management (repouching, etc.) PROCESS - Coordination of Care X - Simple Patient / Family Education for ongoing care 1 15 []  - 0 Complex (extensive) Patient / Family Education for ongoing care []  - 0 Staff obtains Programmer, systems, Records, Test Results / Process Orders []  - 0 Staff telephones HHA, Nursing Homes / Clarify orders / etc []  - 0 Routine Transfer to another Facility (non-emergent condition) []  - 0 Routine Hospital Admission (non-emergent condition) []  - 0 New Admissions / Biomedical engineer / Ordering NPWT, Apligraf, etc. []  - 0 Emergency Hospital Admission (emergent condition) X- 1 10 Simple Discharge Coordination [name] ([id number]) []  - 0 Complex (extensive) Discharge Coordination PROCESS - Special Needs []  - Pediatric / Minor Patient Management 0 []  - 0 Isolation Patient Management []  - 0 Hearing / Language / Visual special needs []  - 0 Assessment of Community assistance (transportation, D/C planning, etc.) []  - 0 Additional assistance / Altered mentation []  - 0 Support Surface(s) Assessment (bed, cushion, seat, etc.) INTERVENTIONS - Wound Cleansing / Measurement X -  Simple Wound Cleansing - one wound 1 5 []  - 0 Complex Wound Cleansing - multiple wounds X- 1 5 Wound  Imaging (photographs - any number of wounds) []  - 0 Wound Tracing (instead of photographs) X- 1 5 Simple Wound Measurement - one wound []  - 0 Complex Wound Measurement - multiple wounds INTERVENTIONS - Wound Dressings []  - Small Wound Dressing one or multiple wounds 0 []  - 0 Medium Wound Dressing one or multiple wounds []  - 0 Large Wound Dressing one or multiple wounds []  - 0 Application of Medications - topical []  - 0 Application of Medications - injection INTERVENTIONS - Miscellaneous []  - External ear exam 0 []  - 0 Specimen Collection (cultures, biopsies, blood, body fluids, etc.) []  - 0 Specimen(s) / Culture(s) sent or taken to Lab for analysis []  - 0 Patient Transfer (multiple staff / Civil Service fast streamer / Similar devices) []  - 0 Simple Staple / Suture removal (25 or less) []  - 0 Complex Staple / Suture removal (26 or more) []  - 0 Hypo / Hyperglycemic Management (close monitor of Blood Glucose) []  - 0 Ankle / Brachial Index (ABI) - do not check if billed separately X- 1 5 Vital Signs [name], [name] ([id number]) Has the patient been seen at the hospital within the last three years: Yes Total Score: 65 Level Of Care: New/Established - Level 2 Electronic Signature(s) Signed: 03/18/2018 5:42:13 PM By: [name] Entered By: [name] on 03/18/2018 16:08:33 [name], [name] ([id number]) -------------------------------------------------------------------------------- Encounter Discharge Information Details Patient Name: [name]. Date of Service: 03/18/2018 2:45 PM Medical Record Number: [id number] Patient Account Number: [id number] Date of Birth/Sex: [birth year] (77 y.o. M) Treating RN: [name] Primary Care [name]: [name] Other Clinician: Referring [name]: [name] Treating [name]: [name]:  5 Encounter Discharge Information Items Discharge Condition: Stable Ambulatory Status: Cane Discharge Destination: Home Transportation: Private Auto Accompanied By: self Schedule Follow-up Appointment: Yes Clinical Summary of Care: Electronic Signature(s) Signed: 03/18/2018 5:42:13 PM By: [name] Entered By: [name] on 03/18/2018 16:09:09 Wagenaar, [name] ([id number]) -------------------------------------------------------------------------------- Lower Extremity Assessment Details Patient Name: [name]. Date of Service: 03/18/2018 2:45 PM Medical Record Number: [id number] Patient Account Number: [id number] Date of Birth/Sex: Mar 17, 1941 (77 y.o. M) Treating RN: [name] Primary Care Cadel Stairs: [name] Other Clinician: Referring Traeh Milroy: [name] Treating Belen Pesch/Extender: [name]: 5 Electronic Signature(s) Signed: 03/18/2018 4:32:29 PM By: [name] Entered By: [name] on 03/18/2018 15:25:08 Leatherbury, [name] ([id number]) -------------------------------------------------------------------------------- Multi Wound Chart Details Patient Name: [name]. Date of Service: 03/18/2018 2:45 PM Medical Record Number: [id number] Patient Account Number: [id number] Date of Birth/Sex: [birth year] (77 y.o. M) Treating RN: [name] Primary Care Kamil Hanigan: [name] Other Clinician: Referring Demar Shad: [name] Treating Romar Woodrick/Extender: [name]: 5 Vital Signs Height(in): 71 Pulse(bpm): 70 Weight(lbs): 177 Blood Pressure(mmHg): 135/35 Body Mass Index(BMI): 25 Temperature(F): 97.9 Respiratory Rate 16 (breaths/min): Photos: [5:No Photos] [N/A:N/A] Wound Location: [5:Sacrum] [N/A:N/A] Wounding Event: [5:Pressure Injury] [N/A:N/A] Primary Etiology: [5:Pressure Ulcer] [N/A:N/A] Comorbid History: [5:Anemia, Hypertension, Peripheral Venous Disease, Type II Diabetes, End Stage Renal  Disease, Gout, Neuropathy] [N/A:N/A] Date Acquired: [5:01/21/2018] [N/A:N/A] Weeks of [name]: [5:5] [N/A:N/A] Wound Status: [5:Open] [N/A:N/A] Measurements L x W x D [5:0.1x0.1x0.1] [N/A:N/A] (cm) Area (cm) : [5:0.008] [N/A:N/A] Volume (cm) : [5:0.001] [N/A:N/A] % Reduction in Area: [5:97.90%] [N/A:N/A] % Reduction in Volume: [5:97.40%] [N/A:N/A] Classification: [5:Category/Stage III] [N/A:N/A] Exudate Amount: [5:None Present] [N/A:N/A] Wound Margin: [5:Flat and Intact] [N/A:N/A] Granulation Amount: [5:None Present (0%)] [N/A:N/A] Necrotic Amount: [5:Small (1-33%)] [N/A:N/A] Exposed Structures: [5:Fat Layer (Subcutaneous Tissue) Exposed: Yes Fascia:  No Tendon: No Muscle: No Joint: No Bone: No] [N/A:N/A] Epithelialization: [5:Small (1-33%)] [N/A:N/A] Periwound Skin Texture: [5:Excoriation: No Induration: No Callus: No Crepitus: No Rash: No Scarring: No] [N/A:N/A] Periwound Skin Moisture: Maceration: No N/A N/A Dry/Scaly: No Periwound Skin Color: Erythema: Yes N/A N/A Atrophie Blanche: No Cyanosis: No Ecchymosis: No Hemosiderin Staining: No Mottled: No Pallor: No Rubor: No Erythema Location: Circumferential N/A N/A Temperature: No Abnormality N/A N/A Tenderness on Palpation: Yes N/A N/A Wound Preparation: Ulcer Cleansing: N/A N/A Rinsed/Irrigated with Saline Topical Anesthetic Applied: Other: lidocaine 4% [name] Notes Electronic Signature(s) Signed: 03/18/2018 5:42:13 PM By: [name] Entered By: [name] on 03/18/2018 16:02:13 Huettner, [name] ([id number]) -------------------------------------------------------------------------------- [place] Details Patient Name: [name]. Date of Service: 03/18/2018 2:45 PM Medical Record Number: [id number] Patient Account Number: [id number] Date of Birth/Sex: [birth year] (77 y.o. M) Treating RN: [name] Primary Care Chanler Schreiter: [name] Other Clinician: Referring Jemarcus Dougal:  [name] Treating Reynoldo Mainer/Extender: [name]: 5 Active Inactive ` Orientation to the Wound Care Program Nursing Diagnoses: Knowledge deficit related to the wound healing center program Goals: Patient/caregiver will verbalize understanding of the [facility] Program Date Initiated: 02/11/2018 Target Resolution Date: 02/21/2018 Goal Status: Active Interventions: Provide education on orientation to the wound center Notes: ` Pressure Nursing Diagnoses: Knowledge deficit related to management of pressures ulcers Goals: Patient will remain free from development of additional pressure ulcers Date Initiated: 02/11/2018 Target Resolution Date: 02/27/2018 Goal Status: Active Interventions: Assess: immobility, friction, shearing, incontinence upon admission and as needed [name] Activities: Patient referred for home evaluation of offloading devices/mattresses : 02/11/2018 Notes: ` Wound/Skin Impairment Nursing Diagnoses: Impaired tissue integrity Goals: Ulcer/skin breakdown will have a volume reduction of 80% by week 12 Date Initiated: 02/11/2018 Target Resolution Date: 05/14/2018 TKAI, SERFASS ([id number]) Goal Status: Active Interventions: Assess ulceration(s) every visit Notes: Electronic Signature(s) Signed: 03/18/2018 5:42:13 PM By: [name] Entered By: [name] on 03/18/2018 16:01:01 Muse, [name] ([id number]) -------------------------------------------------------------------------------- Pain Assessment Details Patient Name: [name]. Date of Service: 03/18/2018 2:45 PM Medical Record Number: [id number] Patient Account Number: [id number] Date of Birth/Sex: 10/01/1940 (77 y.o. M) Treating RN: [name] Primary Care Moataz Tavis: [name] Other Clinician: Referring Symantha Steeber: [name] Treating Oaklee Sunga/Extender: [name]: 5 Active Problems Location of Pain Severity and Description  of Pain Patient Has Paino No Site Locations Pain Management and Medication Current Pain Management: Electronic Signature(s) Signed: 03/18/2018 4:32:29 PM By: [name] Entered By: [name] on 03/18/2018 15:24:11 Busenbark, [name] ([id number]) -------------------------------------------------------------------------------- Patient/Caregiver Education Details Patient Name: [name]. Date of Service: 03/18/2018 2:45 PM Medical Record Number: [id number] Patient Account Number: [id number] Date of Birth/Gender: [birth year] (77 y.o. M) Treating RN: [name] Primary Care Physician: [name] Other Clinician: Referring Physician: Tedra [name] Treating Physician/Extender: Sharalyn Ink in [name]: 5 Education Assessment Education Provided To: Patient Education Topics Provided Pressure: Handouts: Preventing Pressure Ulcers Methods: Explain/Verbal Responses: State content correctly Electronic Signature(s) Signed: 03/18/2018 5:42:13 PM By: [name] Entered By: [name] on 03/18/2018 16:09:28 Szuch, [name] ([id number]) -------------------------------------------------------------------------------- Wound Assessment Details Patient Name: [name]. Date of Service: 03/18/2018 2:45 PM Medical Record Number: [id number] Patient Account Number: [id number] Date of Birth/Sex: 12-Sep-1941 (77 y.o. M) Treating RN: [name] Primary Care Marqus Macphee: [name] Other Clinician: Referring Alberta Lenhard: [name] Treating Fabrizzio Marcella/Extender: [name]: 5 Wound Status Wound Number: 5 Primary Pressure Ulcer Etiology: Wound Location: Sacrum Wound Open  Wounding Event: Pressure Injury Status: Date Acquired: 01/21/2018 Comorbid Anemia, Hypertension, Peripheral Venous Weeks Of [name]: 5 History: Disease, Type II Diabetes, End Stage Renal Clustered Wound: No Disease, Gout, Neuropathy Photos Photo Uploaded By: [name] on 03/18/2018  16:10:49 Wound Measurements Length: (cm) 0.1 Width: (cm) 0.1 Depth: (cm) 0.1 Area: (cm) 0.008 Volume: (cm) 0.001 % Reduction in Area: 97.9% % Reduction in Volume: 97.4% Epithelialization: Small (1-33%) Tunneling: No Undermining: No Wound Description Classification: Category/Stage III Foul Odo Wound Margin: Flat and Intact Slough/F Exudate Amount: None Present r After Cleansing: No ibrino No Wound Bed Granulation Amount: None Present (0%) Exposed Structure Necrotic Amount: Small (1-33%) Fascia Exposed: No Fat Layer (Subcutaneous Tissue) Exposed: Yes Tendon Exposed: No Muscle Exposed: No Joint Exposed: No Bone Exposed: No Periwound Skin Texture Texture Color No Abnormalities Noted: No No Abnormalities Noted: No Callus: No Atrophie Blanche: No Crepitus: No Cyanosis: No Holdman, [name] ([id number]) Excoriation: No Ecchymosis: No Induration: No Erythema: Yes Rash: No Erythema Location: Circumferential Scarring: No Hemosiderin Staining: No Mottled: No Moisture Pallor: No No Abnormalities Noted: No Rubor: No Dry / Scaly: No Maceration: No Temperature / Pain Temperature: No Abnormality Tenderness on Palpation: Yes Wound Preparation Ulcer Cleansing: Rinsed/Irrigated with Saline Topical Anesthetic Applied: Other: lidocaine 4%, [name] Notes Wound #5 (Sacrum) 1. Cleansed with: Clean wound with Normal Saline 5. Secondary Dressing Applied Bordered Foam Dressing Electronic Signature(s) Signed: 03/18/2018 4:32:29 PM By: [name] Entered By: [name] on 03/18/2018 15:30:57 Hyneman, [name] ([id number]) -------------------------------------------------------------------------------- [place] Details Patient Name: [name]. Date of Service: 03/18/2018 2:45 PM Medical Record Number: [id number] Patient Account Number: [id number] Date of Birth/Sex: 01-11-1941 (77 y.o. M) Treating RN: [name] Primary Care Micholas Drumwright: [name] Other Clinician: Referring  Baileigh Modisette: [name] Treating Chaunce Winkels/Extender: [name]: 5 Vital Signs Time Taken: 15:20 Temperature (F): 97.9 Height (in): 71 Pulse (bpm): 70 Weight (lbs): 177 Respiratory Rate (breaths/min): 16 Body Mass Index (BMI): 24.7 Blood Pressure (mmHg): 135/35 Reference Range: 80 - 120 mg / dl Electronic Signature(s) Signed: 03/18/2018 4:32:29 PM By: [name] Entered BySecundino [name] on 03/18/2018 15:24:55

## 2018-03-20 NOTE — Progress Notes (Signed)
COHL, BEHRENS ([id number]) Visit Report for 03/18/2018 Chief Complaint Document Details Patient Name: [name], [name]. Date of Service: 03/18/2018 2:45 PM Medical Record Number: [id number] Patient Account Number: [id number] Date of Birth/Sex: 1941-03-24 (76 y.o. M) Treating RN: [name] Primary Care Provider: Tedra [name] Other Clinician: Referring Provider: Tedra [name] Treating Provider/Extender: [name], [name] Weeks in Treatment: 5 Information Obtained from: Patient Chief Complaint Sacral pressure ulcer Electronic Signature(s) Signed: 03/19/2018 12:21:38 AM By: Johnny Keeler PA-C Entered By: [name] on 03/18/2018 14:49:53 [name], [name] ([id number]) -------------------------------------------------------------------------------- HPI Details Patient Name: [name]. Date of Service: 03/18/2018 2:45 PM Medical Record Number: [id number] Patient Account Number: [id number] Date of Birth/Sex: Dec 20, 1940 (76 y.o. M) Treating RN: [name] Primary Care Provider: Tedra [name] Other Clinician: Referring Provider: Tedra [name] Treating Provider/Extender: [name], [name] Weeks in Treatment: 5 History of Present Illness HPI Description: 77 year old gentleman seen earlier this year in January for a abdominal wound is now back with the same problem which has recurred for about 2 weeks and he has been trying to apply some local antibiotic ointment and a Band-Aid there. Of note he has significant pressure in this area due to his beltline and may have put on some weight which results in his trousers being very tight around the waist. The else has changed in his HandP and his diabetes is pretty well controlled. most recent hemoglobin A1c on 12/06/2016 was 7.8%. he was reviewed by his endocrinologist Dr. Dwyane [name] who made appropriate modifications to his treatment plan. he was recently seen by neurology for mild cognitive impairment that was found to be normal for his  age 106/21/18 patient appears to be doing well in regard to his abdominal wound area on evaluation today. He has been tolerating the dressing changes with the [facility] Dressing I'm pleased with how this has progressed and in fact the wound appears to be completely healed on evaluation today. There is definitely no evidence of infection. 03/29/17 Patient's wound on the abdominal region appears to be completely healed on evaluation today. This wound has been doing very well and I think the biggest thing is going to be keeping it from reactivation where it rubbed on his pants. 04/18/2017 -- he was recently discharged about 3 weeks ago and was using a binder which probably caused him abrasion on the area of the recently healed scar. 05/02/17 on evaluation today patient appears to be doing fairly well in regard to his abdominal wound at the site of the surgical scar. Unfortunately the scar tissue is very fragile and tends to reopen. He did get a small abdominal binder to try to pad the area in order to offload and prevent his pants from rubbing specifically at the site. With that being said he tells me that Dr. Con [name] was concerned that the binder might actually have rub the area causing a reopening. I did have a look at this today and unfortunately it appears that he was putting this over his pants which was making matters worse. 05/09/17 on evaluation today patient continues to do well with the [facility] Dressing. There is a slight skin tear inferior to the wound that we have been treating that may have been due to the dressing sticking slightly. This is minimal however. I did discuss with patient prior to removal of the dressing for dressing changes that they whet this in order to loosen it and not risk damaging in the field. Nonetheless it appears that they have been  doing very well with the dressing changes he had his wife took over the dressing changes as she states that he was doing it wrong.  Overall though I'm pleased with how things are progressing. No fevers, chills, nausea, or vomiting noted at this time. Readmission: 08/27/17 on evaluation today patient appears to be doing similarly to how he was doing prior to healing previously August 2018 when we previously discharged him. He has fortunately been doing well up until just a couple of weeks ago and he had some of the [facility] Dressing over so he began to treat this on his own. He states that this has gotten much smaller but he ran out of supplies and therefore made an appointment to come back in for evaluation. No fevers, chills, nausea, or vomiting noted at this time. He has no discomfort at this point in regard to the wound. He does tell me that he has one maybe two compression fractures in his lower back and that he is going to be seen by the physician at [facility] imaging, [name]. ====== Old notes The 77 year old gentleman who has a past medical history of diabetes mellitus, end-stage renal disease on dialysis, hypertension, secondary hyperparathyroidism, peripheral vascular disease and history of previous arterial bifemoral bypass graft in 1992 also has a history of gout, hyperlipidemia, hypothyroidism. last hemoglobin A1c was 7.5%. status post AV fistula [place], [name]. ([id number]) placement, breast surgery for left granulomatous mastitis, triple a repair in 1992, small bowel obstruction, upper arm angiograms for AV fistula on the left side. was noted to have a superficial skin wound in the periumbilical area since late December 2017. He was asked to clean it with hydrogen peroxide and apply Bactroban. He was seen by vascular surgery in October 2017 where his right ABI was 0.56 left ABI was 0.87 and right TBI was 0.69 and left TBI was 0.70 ===== 09/25/16; patient was previously a patient of Dr. Ardeen [name] who has recurrent skin breakdown in the area of the large incision that was initially 4 apparently an  open AAA aneurysm repair. He had secondary surgery but I'm not sure of what etiology this was. He is been using Hydrofera Blue to the open wounds in the mid abdominal surgical incision. This is now closed over. I think he also uses this area as his waist for his pants and I've asked him to adjust this Readmission: 02/11/18 on evaluation today patient presents for evaluation concerning a sacral pressure ulcer which has been present for several weeks. He states that he actually had several areas that were open there appears just be one remaining. He has been tolerating the dressing changes is wise been using Hydrofera Blue Dressing as left over from his abdominal ulcer that we have previously treated. Fortunately there does not appear to be any evidence of infection which is good news. With that being said he states that he's been having issues at one point even at dialysis he had to discontinue dialysis and go home due to the pain. He has not been using any cushion to sit on although he states he does have one that he can use. No fevers, chills, nausea, or vomiting noted at this time. He had a significant issue with diarrhea prior to this occurring and I believe this may have had some contributing factor as well. 02/18/18 on evaluation today patient's wound actually appears to be doing about the same. Unfortunately he really has not noted any significant improvement over last week he still  is very tender even just to light touch unfortunately over the periwound region. There does not appear to be any evidence of infection which is good news. He is especially tender at the 12 o'clock location even a full fingers with away from the actual wound site. There still slough covering the surface of the wound. 03/04/18 on evaluation today patient's wound actually appears to show signs of great improvement compared to last week's evaluation. He is definitely showing improvement week by week which I think is  great news. Obviously that is what we are looking for. Nonetheless I did not even need to perform any sharp debridement today being that there was no slough noted on the surface of the wound. His blood pressure continues to remain low although this is something that is normal. That is for him. 03/18/18 on evaluation today patient's wound actually appears to potentially be completely healed although I cannot tell for sure. He has a small indeed very small eschar noted over the wound surface which also was measuring smaller than what it was previous. With that being said he has discomfort to the point that I'm not really able to do much with this in order to remove the eschar and prove that everything is completely healed underneath. Nonetheless it's starting to look like this may lift up on its own and I think that may be the best course for him currently. Electronic Signature(s) Signed: 03/19/2018 12:21:38 AM By: Johnny Keeler PA-C Entered By: [name] on 03/19/2018 00:10:42 Rost[place], [name] ([id number]) -------------------------------------------------------------------------------- Physical Exam Details Patient Name: [name]. Date of Service: 03/18/2018 2:45 PM Medical Record Number: [id number] Patient Account Number: [id number] Date of Birth/Sex: 10/29/1940 (76 y.o. M) Treating RN: [name] Primary Care Provider: Tedra [name] Other Clinician: Referring Provider: Tedra [name] Treating Provider/Extender: [name], [name] Weeks in Treatment: 5 Constitutional Well-nourished and well-hydrated in no acute distress. Respiratory normal breathing without difficulty. clear to auscultation bilaterally. Cardiovascular regular rate and rhythm with normal S1, S2. Psychiatric this patient is able to make decisions and demonstrates good insight into disease process. Alert and Oriented x 3. pleasant and cooperative. Notes Patient does not have any evidence of discharge from the  wound location and there does not appear to be any evidence of fluctuance or other abnormality noted at this point. In general I feel like things appear to be doing fairly well in regard to his sacral ulcer. Electronic Signature(s) Signed: 03/19/2018 12:21:38 AM By: Johnny Keeler PA-C Entered By: [name] on 03/19/2018 00:11:58 [place], [name] ([id number]) -------------------------------------------------------------------------------- Physician Orders Details Patient Name: [name]. Date of Service: 03/18/2018 2:45 PM Medical Record Number: [id number] Patient Account Number: [id number] Date of Birth/Sex: [birth year] (76 y.o. M) Treating RN: [name] Primary Care Provider: Tedra [name] Other Clinician: Referring Provider: Tedra [name] Treating Provider/Extender: [name], [name] Weeks in Treatment: 5 Verbal / Phone Orders: No Diagnosis Coding ICD-10 Coding Code Description L89.153 Pressure ulcer of sacral region, stage 3 E11.622 Type 2 diabetes mellitus with other skin ulcer Z99.2 Dependence on renal dialysis Wound Cleansing Wound #5 Sacrum o Clean wound with Normal Saline. o May Shower, gently pat wound dry prior to applying new dressing. Skin Barriers/Peri-Wound Care Wound #5 Sacrum o Skin Prep Secondary Dressing Wound #5 Sacrum o Boardered Foam Dressing Dressing Change Frequency Wound #5 Sacrum o Change Dressing Monday, Wednesday, Friday Follow-up Appointments Wound #5 Sacrum o Return Appointment in 1 week. Off-Loading Wound #5 Sacrum o Other: - Take  cushion to dialysis for offloading Additional Orders / Instructions Wound #5 Sacrum o Other: - take BP on left lower arm with the green cuff Electronic Signature(s) Signed: 03/18/2018 5:42:13 PM By: [name] Signed: 03/19/2018 12:21:38 AM By: Johnny Keeler PA-C Entered By: [name] on 03/18/2018 16:02:53 Mcgue, [name] ([id number]) Reinheimer, [name]  ([id number]) -------------------------------------------------------------------------------- Problem List Details Patient Name: [name]. Date of Service: 03/18/2018 2:45 PM Medical Record Number: [id number] Patient Account Number: [id number] Date of Birth/Sex: [birth year] (76 y.o. M) Treating RN: [name] Primary Care Provider: Tedra [name] Other Clinician: Referring Provider: Tedra [name] Treating Provider/Extender: [name], [name] Weeks in Treatment: 5 Active Problems ICD-10 Evaluated Encounter Code Description Active Date Today Diagnosis L89.153 Pressure ulcer of sacral region, stage 3 02/11/2018 No Yes E11.622 Type 2 diabetes mellitus with other skin ulcer 02/11/2018 No Yes Z99.2 Dependence on renal dialysis 02/11/2018 No Yes Inactive Problems Resolved Problems Electronic Signature(s) Signed: 03/19/2018 12:21:38 AM By: Johnny Keeler PA-C Entered By: [name] on 03/18/2018 14:49:43 Flath, [name] ([id number]) -------------------------------------------------------------------------------- Progress Note Details Patient Name: [name]. Date of Service: 03/18/2018 2:45 PM Medical Record Number: [id number] Patient Account Number: [id number] Date of Birth/Sex: 1941/09/07 (76 y.o. M) Treating RN: [name] Primary Care Provider: Tedra [name] Other Clinician: Referring Provider: Tedra [name] Treating Provider/Extender: [name], [name] Weeks in Treatment: 5 Subjective Chief Complaint Information obtained from Patient Sacral pressure ulcer History of Present Illness (HPI) 77 year old gentleman seen earlier this year in January for a abdominal wound is now back with the same problem which has recurred for about 2 weeks and he has been trying to apply some local antibiotic ointment and a Band-Aid there. Of note he has significant pressure in this area due to his beltline and may have put on some weight which results in his trousers being very tight around  the waist. The else has changed in his HandP and his diabetes is pretty well controlled. most recent hemoglobin A1c on 12/06/2016 was 7.8%. he was reviewed by his endocrinologist Dr. Dwyane [name] who made appropriate modifications to his treatment plan. he was recently seen by neurology for mild cognitive impairment that was found to be normal for his age 13/21/18 patient appears to be doing well in regard to his abdominal wound area on evaluation today. He has been tolerating the dressing changes with the [facility] Dressing I'm pleased with how this has progressed and in fact the wound appears to be completely healed on evaluation today. There is definitely no evidence of infection. 03/29/17 Patient's wound on the abdominal region appears to be completely healed on evaluation today. This wound has been doing very well and I think the biggest thing is going to be keeping it from reactivation where it rubbed on his pants. 04/18/2017 -- he was recently discharged about 3 weeks ago and was using a binder which probably caused him abrasion on the area of the recently healed scar. 05/02/17 on evaluation today patient appears to be doing fairly well in regard to his abdominal wound at the site of the surgical scar. Unfortunately the scar tissue is very fragile and tends to reopen. He did get a small abdominal binder to try to pad the area in order to offload and prevent his pants from rubbing specifically at the site. With that being said he tells me that Dr. Con [name] was concerned that the binder might actually have rub the area causing a reopening. I did have a look at this  today and unfortunately it appears that he was putting this over his pants which was making matters worse. 05/09/17 on evaluation today patient continues to do well with the [facility] Dressing. There is a slight skin tear inferior to the wound that we have been treating that may have been due to the dressing sticking slightly. This is  minimal however. I did discuss with patient prior to removal of the dressing for dressing changes that they whet this in order to loosen it and not risk damaging in the field. Nonetheless it appears that they have been doing very well with the dressing changes he had his wife took over the dressing changes as she states that he was doing it wrong. Overall though I'm pleased with how things are progressing. No fevers, chills, nausea, or vomiting noted at this time. Readmission: 08/27/17 on evaluation today patient appears to be doing similarly to how he was doing prior to healing previously August 2018 when we previously discharged him. He has fortunately been doing well up until just a couple of weeks ago and he had some of the [facility] Dressing over so he began to treat this on his own. He states that this has gotten much smaller but he ran out of supplies and therefore made an appointment to come back in for evaluation. No fevers, chills, nausea, or vomiting noted at this time. He has no discomfort at this point in regard to the wound. He does tell me that he has one maybe two compression fractures in his lower back and that he is going to be seen by the physician at [facility] imaging, [name]. [place], [name] ([id number]) ====== Old notes The 77 year old gentleman who has a past medical history of diabetes mellitus, end-stage renal disease on dialysis, hypertension, secondary hyperparathyroidism, peripheral vascular disease and history of previous arterial bifemoral bypass graft in 1992 also has a history of gout, hyperlipidemia, hypothyroidism. last hemoglobin A1c was 7.5%. status post AV fistula placement, breast surgery for left granulomatous mastitis, triple a repair in 1992, small bowel obstruction, upper arm angiograms for AV fistula on the left side. was noted to have a superficial skin wound in the periumbilical area since late December 2017. He was asked to clean it  with hydrogen peroxide and apply Bactroban. He was seen by vascular surgery in October 2017 where his right ABI was 0.56 left ABI was 0.87 and right TBI was 0.69 and left TBI was 0.70 ===== 09/25/16; patient was previously a patient of Dr. Ardeen [name] who has recurrent skin breakdown in the area of the large incision that was initially 4 apparently an open AAA aneurysm repair. He had secondary surgery but I'm not sure of what etiology this was. He is been using Hydrofera Blue to the open wounds in the mid abdominal surgical incision. This is now closed over. I think he also uses this area as his waist for his pants and I've asked him to adjust this Readmission: 02/11/18 on evaluation today patient presents for evaluation concerning a sacral pressure ulcer which has been present for several weeks. He states that he actually had several areas that were open there appears just be one remaining. He has been tolerating the dressing changes is wise been using Hydrofera Blue Dressing as left over from his abdominal ulcer that we have previously treated. Fortunately there does not appear to be any evidence of infection which is good news. With that being said he states that he's been having issues at one  point even at dialysis he had to discontinue dialysis and go home due to the pain. He has not been using any cushion to sit on although he states he does have one that he can use. No fevers, chills, nausea, or vomiting noted at this time. He had a significant issue with diarrhea prior to this occurring and I believe this may have had some contributing factor as well. 02/18/18 on evaluation today patient's wound actually appears to be doing about the same. Unfortunately he really has not noted any significant improvement over last week he still is very tender even just to light touch unfortunately over the periwound region. There does not appear to be any evidence of infection which is good news. He is  especially tender at the 12 o'clock location even a full fingers with away from the actual wound site. There still slough covering the surface of the wound. 03/04/18 on evaluation today patient's wound actually appears to show signs of great improvement compared to last week's evaluation. He is definitely showing improvement week by week which I think is great news. Obviously that is what we are looking for. Nonetheless I did not even need to perform any sharp debridement today being that there was no slough noted on the surface of the wound. His blood pressure continues to remain low although this is something that is normal. That is for him. 03/18/18 on evaluation today patient's wound actually appears to potentially be completely healed although I cannot tell for sure. He has a small indeed very small eschar noted over the wound surface which also was measuring smaller than what it was previous. With that being said he has discomfort to the point that I'm not really able to do much with this in order to remove the eschar and prove that everything is completely healed underneath. Nonetheless it's starting to look like this may lift up on its own and I think that may be the best course for him currently. Patient History Information obtained from Patient. Family History Diabetes - Father,Paternal Grandparents, Heart Disease - Father, Hypertension - Father, No family history of Cancer, Hereditary Spherocytosis, Kidney Disease, Lung Disease, Seizures, Stroke, Thyroid Problems, Tuberculosis. Social History Former smoker - quit in 1996, Marital Status - Married, Alcohol Use - Never - quit in 1976, Drug Use - No History, Caffeine Use - Never. Mathwig, [name] ([id number]) 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 Review of Systems (ROS) Constitutional Symptoms (General Health) Denies complaints or  symptoms of Fever, Chills. Respiratory The patient has no complaints or symptoms. Cardiovascular The patient has no complaints or symptoms. Psychiatric The patient has no complaints or symptoms. Objective Constitutional Well-nourished and well-hydrated in no acute distress. Vitals Time Taken: 3:20 PM, Height: 71 in, Weight: 177 lbs, BMI: 24.7, Temperature: 97.9 F, Pulse: 70 bpm, Respiratory Rate: 16 breaths/min, Blood Pressure: 135/35 mmHg. Respiratory normal breathing without difficulty. clear to auscultation bilaterally. Cardiovascular regular rate and rhythm with normal S1, S2. Psychiatric this patient is able to make decisions and demonstrates good insight into disease process. Alert and Oriented x 3. pleasant and cooperative. General Notes: Patient does not have any evidence of discharge from the wound location and there does not appear to be any evidence of fluctuance or other abnormality noted at this point. In general I feel like things appear to be doing fairly well in regard to his sacral ulcer. Integumentary (Hair, Skin) Wound #5 status is Open. Original cause  of wound was Pressure Injury. The wound is located on the Sacrum. The wound measures 0.1cm length x 0.1cm width x 0.1cm depth; 0.008cm^2 area and 0.001cm^3 volume. There is Fat Layer (Subcutaneous Tissue) Exposed exposed. There is no tunneling or undermining noted. There is a none present amount of drainage noted. The wound margin is flat and intact. There is no granulation within the wound bed. There is a small (1-33%) amount of necrotic tissue within the wound bed. The periwound skin appearance exhibited: Erythema. The periwound skin appearance did not exhibit: Callus, Crepitus, Excoriation, Induration, Rash, Scarring, Dry/Scaly, Maceration, Atrophie Blanche, Cyanosis, Ecchymosis, Hemosiderin Staining, Mottled, Pallor, Rubor. The surrounding wound skin color is noted with erythema which is circumferential. Periwound  temperature was noted as No Abnormality. The periwound has tenderness on palpation. Benda, [name] ([id number]) Assessment Active Problems ICD-10 Pressure ulcer of sacral region, stage 3 Type 2 diabetes mellitus with other skin ulcer Dependence on renal dialysis Plan Wound Cleansing: Wound #5 Sacrum: Clean wound with Normal Saline. May Shower, gently pat wound dry prior to applying new dressing. Skin Barriers/Peri-Wound Care: Wound #5 Sacrum: Skin Prep Secondary Dressing: Wound #5 Sacrum: Boardered Foam Dressing Dressing Change Frequency: Wound #5 Sacrum: Change Dressing Monday, Wednesday, Friday Follow-up Appointments: Wound #5 Sacrum: Return Appointment in 1 week. Off-Loading: Wound #5 Sacrum: Other: - Take cushion to dialysis for offloading Additional Orders / Instructions: Wound #5 Sacrum: Other: - take BP on left lower arm with the green cuff At this point I'm gonna suggest that we just use a cover protective dressing for the next week over the sacral region. We will actually see him for follow-up visit to see were things stand. If he has any drainage he can use the Prisma and let us know. However at this point I'm not sure the Prisma is necessary in the big thing I want to do is to ensure this is completely healed which again I would like to do by protecting it and see if any drainage occurs. He's in agreement this plan. Otherwise will see him back for reevaluation in two weeks time. Please see above for specific wound care orders. Electronic Signature(s) Signed: 03/19/2018 12:21:38 AM By: Johnny Keeler PA-C Entered By: [name] on 03/19/2018 00:12:23 [place], [name] ([id number]) Baehr, [name] ([id number]) -------------------------------------------------------------------------------- ROS/PFSH Details Patient Name: [name]. Date of Service: 03/18/2018 2:45 PM Medical Record Number: [id number] Patient Account Number: [id number] Date of Birth/Sex:  Oct 20, 1940 (76 y.o. M) Treating RN: [name] Primary Care Provider: Tedra [name] Other Clinician: Referring Provider: Tedra [name] Treating Provider/Extender: [name], [name] Weeks in Treatment: 5 Information Obtained From Patient Wound History Do you currently have one or more open woundso Yes How many open wounds do you currently haveo 1 Approximately how long have you had your woundso 3 or 4 weeks How have you been treating your wound(s) until nowo hydrafera blue Has your wound(s) ever healed and then re-openedo No Have you had any lab work done in the past montho No Have you tested positive for an antibiotic resistant organism (MRSA, VRE)o No Have you tested positive for osteomyelitis (bone infection)o No Have you had any tests for circulation on your legso No Constitutional Symptoms (General Health) Complaints and Symptoms: Negative for: Fever; Chills Eyes Medical History: Negative for: Cataracts; Glaucoma; Optic Neuritis Ear/Nose/Mouth/Throat Medical History: Negative for: Chronic sinus problems/congestion; Middle ear problems Hematologic/Lymphatic Medical History: Positive for: Anemia Negative for: Hemophilia; Human Immunodeficiency Virus; Lymphedema; Sickle Cell Disease Respiratory Complaints and  Symptoms: No Complaints or Symptoms Cardiovascular Complaints and Symptoms: No Complaints or Symptoms Medical History: Positive for: Hypertension; Peripheral Venous Disease Negative for: Angina; Arrhythmia; Congestive Heart Failure; Coronary Artery Disease; Deep Vein Thrombosis; Hypotension; Myocardial Infarction; Peripheral Arterial Disease; Phlebitis; Vasculitis Past Medical History Notes: [name], [name] ([id number]) artificial valve, hyperlipidemia Gastrointestinal Medical History: Negative for: Cirrhosis ; Colitis; Crohnos; Hepatitis A; Hepatitis B; Hepatitis C Past Medical History Notes: scar tissue from multiple surgeries Endocrine Medical  History: Positive for: Type II Diabetes Negative for: Type I Diabetes Treated with: Insulin Genitourinary Medical History: 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 Immunological Medical History: Negative for: Lupus Erythematosus; Raynaudos; Scleroderma Integumentary (Skin) Medical History: Negative for: History of Burn; History of pressure wounds Musculoskeletal Medical History: Positive for: Gout Negative for: Rheumatoid Arthritis; Osteoarthritis; Osteomyelitis Neurologic Medical History: Positive for: Neuropathy Negative for: Dementia; Quadriplegia; Paraplegia; Seizure Disorder Oncologic Medical History: Negative for: Received Chemotherapy; Received Radiation Psychiatric Complaints and Symptoms: No Complaints or Symptoms Medical History: Negative for: Anorexia/bulimia; Confinement Anxiety Immunizations Pneumococcal Vaccine: [name], [name] ([id number]) Received Pneumococcal Vaccination: Yes Immunization Notes: uo to date Implantable Devices Family and Social History Cancer: No; Diabetes: Yes - Father,Paternal Grandparents; Heart Disease: Yes - Father; Hereditary Spherocytosis: No; Hypertension: Yes - Father; Kidney Disease: No; Lung Disease: No; Seizures: No; Stroke: No; Thyroid Problems: No; Tuberculosis: No; Former smoker - quit in 1996; Marital Status - Married; Alcohol Use: Never - quit in 1976; Drug Use: No History; Caffeine Use: Never; Financial Concerns: No; Food, Clothing or Shelter Needs: No; Support System Lacking: No; Transportation Concerns: No; Advanced Directives: No; Patient does not want information on Advanced Directives Electronic Signature(s) Signed: 03/19/2018 12:21:38 AM By: Johnny Keeler PA-C Signed: 03/19/2018 5:20:21 PM By: [name] Entered By: [name] on 03/19/2018 00:11:13 Yohn, [name] ([id number]) -------------------------------------------------------------------------------- SuperBill Details Patient Name:  [name]. Date of Service: 03/18/2018 Medical Record Number: [id number] Patient Account Number: [id number] Date of Birth/Sex: 1941/07/20 (76 y.o. M) Treating RN: [name] Primary Care Provider: Tedra [name] Other Clinician: Referring Provider: Tedra [name] Treating Provider/Extender: [name], [name] Weeks in Treatment: 5 Diagnosis Coding ICD-10 Codes Code Description L89.153 Pressure ulcer of sacral region, stage 3 E11.622 Type 2 diabetes mellitus with other skin ulcer Z99.2 Dependence on renal dialysis Facility Procedures CPT4 Code: 32992426 Description: [phone] - WOUND CARE VISIT-LEV 2 EST PT Modifier: Quantity: 1 Physician Procedures CPT4 Code: 6222979 Description: 89211 - WC PHYS LEVEL 3 - EST PT ICD-10 Diagnosis Description L89.153 Pressure ulcer of sacral region, stage 3 E11.622 Type 2 diabetes mellitus with other skin ulcer Z99.2 Dependence on renal dialysis Modifier: Quantity: 1 Electronic Signature(s) Signed: 03/19/2018 12:21:38 AM By: Johnny Keeler PA-C Entered By: [name] on 03/19/2018 00:13:44

## 2018-03-21 DIAGNOSIS — D631 Anemia in chronic kidney disease: Secondary | ICD-10-CM | POA: Diagnosis not present

## 2018-03-21 DIAGNOSIS — N2581 Secondary hyperparathyroidism of renal origin: Secondary | ICD-10-CM | POA: Diagnosis not present

## 2018-03-21 DIAGNOSIS — E1129 Type 2 diabetes mellitus with other diabetic kidney complication: Secondary | ICD-10-CM | POA: Diagnosis not present

## 2018-03-21 DIAGNOSIS — N186 End stage renal disease: Secondary | ICD-10-CM | POA: Diagnosis not present

## 2018-03-24 DIAGNOSIS — D631 Anemia in chronic kidney disease: Secondary | ICD-10-CM | POA: Diagnosis not present

## 2018-03-24 DIAGNOSIS — Z992 Dependence on renal dialysis: Secondary | ICD-10-CM | POA: Diagnosis not present

## 2018-03-24 DIAGNOSIS — E1129 Type 2 diabetes mellitus with other diabetic kidney complication: Secondary | ICD-10-CM | POA: Diagnosis not present

## 2018-03-24 DIAGNOSIS — N186 End stage renal disease: Secondary | ICD-10-CM | POA: Diagnosis not present

## 2018-03-24 DIAGNOSIS — N2581 Secondary hyperparathyroidism of renal origin: Secondary | ICD-10-CM | POA: Diagnosis not present

## 2018-03-24 DIAGNOSIS — D509 Iron deficiency anemia, unspecified: Secondary | ICD-10-CM | POA: Diagnosis not present

## 2018-03-24 DIAGNOSIS — E1122 Type 2 diabetes mellitus with diabetic chronic kidney disease: Secondary | ICD-10-CM | POA: Diagnosis not present

## 2018-03-24 DIAGNOSIS — E876 Hypokalemia: Secondary | ICD-10-CM | POA: Diagnosis not present

## 2018-03-25 ENCOUNTER — Ambulatory Visit (HOSPITAL_COMMUNITY): Payer: Medicare Other

## 2018-03-26 DIAGNOSIS — D631 Anemia in chronic kidney disease: Secondary | ICD-10-CM | POA: Diagnosis not present

## 2018-03-26 DIAGNOSIS — D509 Iron deficiency anemia, unspecified: Secondary | ICD-10-CM | POA: Diagnosis not present

## 2018-03-26 DIAGNOSIS — N186 End stage renal disease: Secondary | ICD-10-CM | POA: Diagnosis not present

## 2018-03-26 DIAGNOSIS — N2581 Secondary hyperparathyroidism of renal origin: Secondary | ICD-10-CM | POA: Diagnosis not present

## 2018-03-26 DIAGNOSIS — E1129 Type 2 diabetes mellitus with other diabetic kidney complication: Secondary | ICD-10-CM | POA: Diagnosis not present

## 2018-03-26 DIAGNOSIS — E876 Hypokalemia: Secondary | ICD-10-CM | POA: Diagnosis not present

## 2018-03-28 DIAGNOSIS — N2581 Secondary hyperparathyroidism of renal origin: Secondary | ICD-10-CM | POA: Diagnosis not present

## 2018-03-28 DIAGNOSIS — E876 Hypokalemia: Secondary | ICD-10-CM | POA: Diagnosis not present

## 2018-03-28 DIAGNOSIS — E1129 Type 2 diabetes mellitus with other diabetic kidney complication: Secondary | ICD-10-CM | POA: Diagnosis not present

## 2018-03-28 DIAGNOSIS — N186 End stage renal disease: Secondary | ICD-10-CM | POA: Diagnosis not present

## 2018-03-28 DIAGNOSIS — D509 Iron deficiency anemia, unspecified: Secondary | ICD-10-CM | POA: Diagnosis not present

## 2018-03-28 DIAGNOSIS — D631 Anemia in chronic kidney disease: Secondary | ICD-10-CM | POA: Diagnosis not present

## 2018-03-31 DIAGNOSIS — N2581 Secondary hyperparathyroidism of renal origin: Secondary | ICD-10-CM | POA: Diagnosis not present

## 2018-03-31 DIAGNOSIS — D631 Anemia in chronic kidney disease: Secondary | ICD-10-CM | POA: Diagnosis not present

## 2018-03-31 DIAGNOSIS — E876 Hypokalemia: Secondary | ICD-10-CM | POA: Diagnosis not present

## 2018-03-31 DIAGNOSIS — E1129 Type 2 diabetes mellitus with other diabetic kidney complication: Secondary | ICD-10-CM | POA: Diagnosis not present

## 2018-03-31 DIAGNOSIS — N186 End stage renal disease: Secondary | ICD-10-CM | POA: Diagnosis not present

## 2018-03-31 DIAGNOSIS — D509 Iron deficiency anemia, unspecified: Secondary | ICD-10-CM | POA: Diagnosis not present

## 2018-04-01 ENCOUNTER — Encounter: Payer: Medicare Other | Attending: Physician Assistant | Admitting: Physician Assistant

## 2018-04-01 DIAGNOSIS — E1140 Type 2 diabetes mellitus with diabetic neuropathy, unspecified: Secondary | ICD-10-CM | POA: Insufficient documentation

## 2018-04-01 DIAGNOSIS — Z872 Personal history of diseases of the skin and subcutaneous tissue: Secondary | ICD-10-CM | POA: Insufficient documentation

## 2018-04-01 DIAGNOSIS — M109 Gout, unspecified: Secondary | ICD-10-CM | POA: Diagnosis not present

## 2018-04-01 DIAGNOSIS — Z952 Presence of prosthetic heart valve: Secondary | ICD-10-CM | POA: Diagnosis not present

## 2018-04-01 DIAGNOSIS — I120 Hypertensive chronic kidney disease with stage 5 chronic kidney disease or end stage renal disease: Secondary | ICD-10-CM | POA: Insufficient documentation

## 2018-04-01 DIAGNOSIS — Z87891 Personal history of nicotine dependence: Secondary | ICD-10-CM | POA: Diagnosis not present

## 2018-04-01 DIAGNOSIS — Z794 Long term (current) use of insulin: Secondary | ICD-10-CM | POA: Diagnosis not present

## 2018-04-01 DIAGNOSIS — N186 End stage renal disease: Secondary | ICD-10-CM | POA: Insufficient documentation

## 2018-04-01 DIAGNOSIS — E1151 Type 2 diabetes mellitus with diabetic peripheral angiopathy without gangrene: Secondary | ICD-10-CM | POA: Insufficient documentation

## 2018-04-01 DIAGNOSIS — E1122 Type 2 diabetes mellitus with diabetic chronic kidney disease: Secondary | ICD-10-CM | POA: Diagnosis not present

## 2018-04-01 DIAGNOSIS — Z992 Dependence on renal dialysis: Secondary | ICD-10-CM | POA: Insufficient documentation

## 2018-04-01 DIAGNOSIS — Z09 Encounter for follow-up examination after completed treatment for conditions other than malignant neoplasm: Secondary | ICD-10-CM | POA: Diagnosis not present

## 2018-04-01 DIAGNOSIS — L89159 Pressure ulcer of sacral region, unspecified stage: Secondary | ICD-10-CM | POA: Diagnosis not present

## 2018-04-02 DIAGNOSIS — E1129 Type 2 diabetes mellitus with other diabetic kidney complication: Secondary | ICD-10-CM | POA: Diagnosis not present

## 2018-04-02 DIAGNOSIS — N2581 Secondary hyperparathyroidism of renal origin: Secondary | ICD-10-CM | POA: Diagnosis not present

## 2018-04-02 DIAGNOSIS — D631 Anemia in chronic kidney disease: Secondary | ICD-10-CM | POA: Diagnosis not present

## 2018-04-02 DIAGNOSIS — E876 Hypokalemia: Secondary | ICD-10-CM | POA: Diagnosis not present

## 2018-04-02 DIAGNOSIS — N186 End stage renal disease: Secondary | ICD-10-CM | POA: Diagnosis not present

## 2018-04-02 DIAGNOSIS — D509 Iron deficiency anemia, unspecified: Secondary | ICD-10-CM | POA: Diagnosis not present

## 2018-04-02 NOTE — Progress Notes (Signed)
[name], [name] ([id number]) Visit Report for 04/01/2018 Chief Complaint Document Details Patient Name: [name], [name]. Date of Service: 04/01/2018 9:00 AM Medical Record Number: [id number] Patient Account Number: [id number] Date of Birth/Sex: Mar 19, 1941 (76 y.o. M) Treating RN: [name] Primary Care Provider: Tedra [name] Other Clinician: Referring Provider: Tedra [name] Treating Provider/Extender: [name], [name] Weeks in Treatment: 7 Information Obtained from: Patient Chief Complaint Sacral pressure ulcer Electronic Signature(s) Signed: 04/01/2018 11:53:57 PM By: Johnny Keeler PA-C Entered By: [name] on 04/01/2018 09:10:57 [name], [name] ([id number]) -------------------------------------------------------------------------------- HPI Details Patient Name: [name]. Date of Service: 04/01/2018 9:00 AM Medical Record Number: [id number] Patient Account Number: [id number] Date of Birth/Sex: [birth year] (76 y.o. M) Treating RN: [name] Primary Care Provider: Tedra [name] Other Clinician: Referring Provider: Tedra [name] Treating Provider/Extender: [name], [name] Weeks in Treatment: 7 History of Present Illness HPI Description: 77 year old gentleman seen earlier this year in January for a abdominal wound is now back with the same problem which has recurred for about 2 weeks and he has been trying to apply some local antibiotic ointment and a Band-Aid there. Of note he has significant pressure in this area due to his beltline and may have put on some weight which results in his trousers being very tight around the waist. The else has changed in his HandP and his diabetes is pretty well controlled. most recent hemoglobin A1c on 12/06/2016 was 7.8%. he was reviewed by his endocrinologist Dr. Dwyane [name] who made appropriate modifications to his treatment plan. he was recently seen by neurology for mild cognitive impairment that was found to be normal for his  age 16/21/18 patient appears to be doing well in regard to his abdominal wound area on evaluation today. He has been tolerating the dressing changes with the [name] Dressing I'm pleased with how this has progressed and in fact the wound appears to be completely healed on evaluation today. There is definitely no evidence of infection. 03/29/17 Patient's wound on the abdominal region appears to be completely healed on evaluation today. This wound has been doing very well and I think the biggest thing is going to be keeping it from reactivation where it rubbed on his pants. 04/18/2017 -- he was recently discharged about 3 weeks ago and was using a binder which probably caused him abrasion on the area of the recently healed scar. 05/02/17 on evaluation today patient appears to be doing fairly well in regard to his abdominal wound at the site of the surgical scar. Unfortunately the scar tissue is very fragile and tends to reopen. He did get a small abdominal binder to try to pad the area in order to offload and prevent his pants from rubbing specifically at the site. With that being said he tells me that Dr. Con [name] was concerned that the binder might actually have rub the area causing a reopening. I did have a look at this today and unfortunately it appears that he was putting this over his pants which was making matters worse. 05/09/17 on evaluation today patient continues to do well with the [name] Dressing. There is a slight skin tear inferior to the wound that we have been treating that may have been due to the dressing sticking slightly. This is minimal however. I did discuss with patient prior to removal of the dressing for dressing changes that they whet this in order to loosen it and not risk damaging in the field. Nonetheless it appears that they have been  doing very well with the dressing changes he had his wife took over the dressing changes as she states that he was doing it wrong.  Overall though I'm pleased with how things are progressing. No fevers, chills, nausea, or vomiting noted at this time. Readmission: 08/27/17 on evaluation today patient appears to be doing similarly to how he was doing prior to healing previously August 2018 when we previously discharged him. He has fortunately been doing well up until just a couple of weeks ago and he had some of the [name] Dressing over so he began to treat this on his own. He states that this has gotten much smaller but he ran out of supplies and therefore made an appointment to come back in for evaluation. No fevers, chills, nausea, or vomiting noted at this time. He has no discomfort at this point in regard to the wound. He does tell me that he has one maybe two compression fractures in his lower back and that he is going to be seen by the physician at [name], Dr. Estanislado [name]. ====== Old notes The 77 year old gentleman who has a past medical history of diabetes mellitus, end-stage renal disease on dialysis, hypertension, secondary hyperparathyroidism, peripheral vascular disease and history of previous arterial bifemoral bypass graft in 1992 also has a history of gout, hyperlipidemia, hypothyroidism. last hemoglobin A1c was 7.5%. status post AV fistula [name], [name]. ([id number]) placement, breast surgery for left granulomatous mastitis, triple a repair in 1992, small bowel obstruction, upper arm angiograms for AV fistula on the left side. was noted to have a superficial skin wound in the periumbilical area since late December 2017. He was asked to clean it with hydrogen peroxide and apply Bactroban. He was seen by vascular surgery in October 2017 where his right ABI was 0.56 left ABI was 0.87 and right TBI was 0.69 and left TBI was 0.70 ===== 09/25/16; patient was previously a patient of Dr. Ardeen Garland who has recurrent skin breakdown in the area of the large incision that was initially 4 apparently an  open AAA aneurysm repair. He had secondary surgery but I'm not sure of what etiology this was. He is been using Hydrofera Blue to the open wounds in the mid abdominal surgical incision. This is now closed over. I think he also uses this area as his waist for his pants and I've asked him to adjust this Readmission: 02/11/18 on evaluation today patient presents for evaluation concerning a sacral pressure ulcer which has been present for several weeks. He states that he actually had several areas that were open there appears just be one remaining. He has been tolerating the dressing changes is wise been using Hydrofera Blue Dressing as left over from his abdominal ulcer that we have previously treated. Fortunately there does not appear to be any evidence of infection which is good news. With that being said he states that he's been having issues at one point even at dialysis he had to discontinue dialysis and go home due to the pain. He has not been using any cushion to sit on although he states he does have one that he can use. No fevers, chills, nausea, or vomiting noted at this time. He had a significant issue with diarrhea prior to this occurring and I believe this may have had some contributing factor as well. 02/18/18 on evaluation today patient's wound actually appears to be doing about the same. Unfortunately he really has not noted any significant improvement over last week he still  is very tender even just to light touch unfortunately over the periwound region. There does not appear to be any evidence of infection which is good news. He is especially tender at the 12 o'clock location even a full fingers with away from the actual wound site. There still slough covering the surface of the wound. 03/04/18 on evaluation today patient's wound actually appears to show signs of great improvement compared to last week's evaluation. He is definitely showing improvement week by week which I think is  great news. Obviously that is what we are looking for. Nonetheless I did not even need to perform any sharp debridement today being that there was no slough noted on the surface of the wound. His blood pressure continues to remain low although this is something that is normal. That is for him. 03/18/18 on evaluation today patient's wound actually appears to potentially be completely healed although I cannot tell for sure. He has a small indeed very small eschar noted over the wound surface which also was measuring smaller than what it was previous. With that being said he has discomfort to the point that I'm not really able to do much with this in order to remove the eschar and prove that everything is completely healed underneath. Nonetheless it's starting to look like this may lift up on its own and I think that may be the best course for him currently. 04/01/18 on evaluation today patient appears to be doing very well in fact his wound is completely healed today. He's been tolerating the dressing changes without complication. With that being said I do believe at this point that he is ready for discharge she still having some discomfort deeper and he tells me this may just be some irritation from the long periods of time he has to sit for dialysis nonetheless he's not having the redness that was previously noted I think he has done very well trying to keep pressure off of this area. Electronic Signature(s) Signed: 04/01/2018 11:53:57 PM By: Johnny Keeler PA-C Entered By: [name] on 04/01/2018 09:46:57 Latterell, [name] ([id number]) -------------------------------------------------------------------------------- Physical Exam Details Patient Name: [name]. Date of Service: 04/01/2018 9:00 AM Medical Record Number: [id number] Patient Account Number: [id number] Date of Birth/Sex: 05/08/1941 (76 y.o. M) Treating RN: [name] Primary Care Provider: Tedra [name] Other  Clinician: Referring Provider: Tedra [name] Treating Provider/Extender: STONE III, [name] Weeks in Treatment: 7 Constitutional Thin and well-hydrated in no acute distress. Respiratory normal breathing without difficulty. Psychiatric this patient is able to make decisions and demonstrates good insight into disease process. Alert and Oriented x 3. pleasant and cooperative. Notes Patient has no wound opening everything appears to be completely healed on evaluation today. Electronic Signature(s) Signed: 04/01/2018 11:53:57 PM By: Johnny Keeler PA-C Entered By: [name] on 04/01/2018 09:47:50 [name], [name] ([id number]) -------------------------------------------------------------------------------- Physician Orders Details Patient Name: [name]. Date of Service: 04/01/2018 9:00 AM Medical Record Number: [id number] Patient Account Number: [id number] Date of Birth/Sex: 1941/06/15 (76 y.o. M) Treating RN: [name] Primary Care Provider: Tedra [name] Other Clinician: Referring Provider: Tedra [name] Treating Provider/Extender: [name], [name] Weeks in Treatment: 7 Verbal / Phone Orders: Yes ClinicianCarolyne Fiscal, Debi Read Back and Verified: Yes Diagnosis Coding ICD-10 Coding Code Description L89.153 Pressure ulcer of sacral region, stage 3 E11.622 Type 2 diabetes mellitus with other skin ulcer Z99.2 Dependence on renal dialysis Discharge From [name] o Discharge from [place] pressure off of  area and reposition often. Please call our office if you have any questions or concerns. Electronic Signature(s) Signed: 04/01/2018 5:13:20 PM By: Alric Quan Signed: 04/01/2018 11:53:57 PM By: Johnny Keeler PA-C Entered By: Alric Quan on 04/01/2018 09:45:23 Markus, [name] ([id number]) -------------------------------------------------------------------------------- Problem List Details Patient Name: [name]. Date of Service:  04/01/2018 9:00 AM Medical Record Number: [id number] Patient Account Number: [id number] Date of Birth/Sex: 15-Apr-1941 (76 y.o. M) Treating RN: [name] Primary Care Provider: Tedra [name] Other Clinician: Referring Provider: Tedra [name] Treating Provider/Extender: [name], [name] Weeks in Treatment: 7 Active Problems ICD-10 Evaluated Encounter Code Description Active Date Today Diagnosis L89.153 Pressure ulcer of sacral region, stage 3 02/11/2018 No Yes E11.622 Type 2 diabetes mellitus with other skin ulcer 02/11/2018 No Yes Z99.2 Dependence on renal dialysis 02/11/2018 No Yes Inactive Problems Resolved Problems Electronic Signature(s) Signed: 04/01/2018 11:53:57 PM By: Johnny Keeler PA-C Entered By: [name] on 04/01/2018 09:10:50 Shellenbarger, [name] ([id number]) -------------------------------------------------------------------------------- Progress Note Details Patient Name: [name]. Date of Service: 04/01/2018 9:00 AM Medical Record Number: [id number] Patient Account Number: [id number] Date of Birth/Sex: 12-23-1940 (76 y.o. M) Treating RN: [name] Primary Care Provider: Tedra [name] Other Clinician: Referring Provider: Tedra [name] Treating Provider/Extender: [name], [name] Weeks in Treatment: 7 Subjective Chief Complaint Information obtained from Patient Sacral pressure ulcer History of Present Illness (HPI) 77 year old gentleman seen earlier this year in January for a abdominal wound is now back with the same problem which has recurred for about 2 weeks and he has been trying to apply some local antibiotic ointment and a Band-Aid there. Of note he has significant pressure in this area due to his beltline and may have put on some weight which results in his trousers being very tight around the waist. The else has changed in his HandP and his diabetes is pretty well controlled. most recent hemoglobin A1c on 12/06/2016 was 7.8%. he was reviewed by his  endocrinologist Dr. Dwyane [name] who made appropriate modifications to his treatment plan. he was recently seen by neurology for mild cognitive impairment that was found to be normal for his age 89/21/18 patient appears to be doing well in regard to his abdominal wound area on evaluation today. He has been tolerating the dressing changes with the [name] Dressing I'm pleased with how this has progressed and in fact the wound appears to be completely healed on evaluation today. There is definitely no evidence of infection. 03/29/17 Patient's wound on the abdominal region appears to be completely healed on evaluation today. This wound has been doing very well and I think the biggest thing is going to be keeping it from reactivation where it rubbed on his pants. 04/18/2017 -- he was recently discharged about 3 weeks ago and was using a binder which probably caused him abrasion on the area of the recently healed scar. 05/02/17 on evaluation today patient appears to be doing fairly well in regard to his abdominal wound at the site of the surgical scar. Unfortunately the scar tissue is very fragile and tends to reopen. He did get a small abdominal binder to try to pad the area in order to offload and prevent his pants from rubbing specifically at the site. With that being said he tells me that Dr. Con [name] was concerned that the binder might actually have rub the area causing a reopening. I did have a look at this today and unfortunately it appears that he was putting this over his pants which  was making matters worse. 05/09/17 on evaluation today patient continues to do well with the [name] Dressing. There is a slight skin tear inferior to the wound that we have been treating that may have been due to the dressing sticking slightly. This is minimal however. I did discuss with patient prior to removal of the dressing for dressing changes that they whet this in order to loosen it and not risk damaging in  the field. Nonetheless it appears that they have been doing very well with the dressing changes he had his wife took over the dressing changes as she states that he was doing it wrong. Overall though I'm pleased with how things are progressing. No fevers, chills, nausea, or vomiting noted at this time. Readmission: 08/27/17 on evaluation today patient appears to be doing similarly to how he was doing prior to healing previously August 2018 when we previously discharged him. He has fortunately been doing well up until just a couple of weeks ago and he had some of the [facility] Dressing over so he began to treat this on his own. He states that this has gotten much smaller but he ran out of supplies and therefore made an appointment to come back in for evaluation. No fevers, chills, nausea, or vomiting noted at this time. He has no discomfort at this point in regard to the wound. He does tell me that he has one maybe two compression fractures in his lower back and that he is going to be seen by the physician at [name], Dr. Estanislado [name]. [name], [name] ([id number]) ====== Old notes The 77 year old gentleman who has a past medical history of diabetes mellitus, end-stage renal disease on dialysis, hypertension, secondary hyperparathyroidism, peripheral vascular disease and history of previous arterial bifemoral bypass graft in 1992 also has a history of gout, hyperlipidemia, hypothyroidism. last hemoglobin A1c was 7.5%. status post AV fistula placement, breast surgery for left granulomatous mastitis, triple a repair in 1992, small bowel obstruction, upper arm angiograms for AV fistula on the left side. was noted to have a superficial skin wound in the periumbilical area since late December 2017. He was asked to clean it with hydrogen peroxide and apply Bactroban. He was seen by vascular surgery in October 2017 where his right ABI was 0.56 left ABI was 0.87 and right TBI was 0.69  and left TBI was 0.70 ===== 09/25/16; patient was previously a patient of Dr. Ardeen Garland who has recurrent skin breakdown in the area of the large incision that was initially 4 apparently an open AAA aneurysm repair. He had secondary surgery but I'm not sure of what etiology this was. He is been using Hydrofera Blue to the open wounds in the mid abdominal surgical incision. This is now closed over. I think he also uses this area as his waist for his pants and I've asked him to adjust this Readmission: 02/11/18 on evaluation today patient presents for evaluation concerning a sacral pressure ulcer which has been present for several weeks. He states that he actually had several areas that were open there appears just be one remaining. He has been tolerating the dressing changes is wise been using Hydrofera Blue Dressing as left over from his abdominal ulcer that we have previously treated. Fortunately there does not appear to be any evidence of infection which is good news. With that being said he states that he's been having issues at one point even at dialysis he had to discontinue dialysis and go home due to  the pain. He has not been using any cushion to sit on although he states he does have one that he can use. No fevers, chills, nausea, or vomiting noted at this time. He had a significant issue with diarrhea prior to this occurring and I believe this may have had some contributing factor as well. 02/18/18 on evaluation today patient's wound actually appears to be doing about the same. Unfortunately he really has not noted any significant improvement over last week he still is very tender even just to light touch unfortunately over the periwound region. There does not appear to be any evidence of infection which is good news. He is especially tender at the 12 o'clock location even a full fingers with away from the actual wound site. There still slough covering the surface of the wound. 03/04/18 on  evaluation today patient's wound actually appears to show signs of great improvement compared to last week's evaluation. He is definitely showing improvement week by week which I think is great news. Obviously that is what we are looking for. Nonetheless I did not even need to perform any sharp debridement today being that there was no slough noted on the surface of the wound. His blood pressure continues to remain low although this is something that is normal. That is for him. 03/18/18 on evaluation today patient's wound actually appears to potentially be completely healed although I cannot tell for sure. He has a small indeed very small eschar noted over the wound surface which also was measuring smaller than what it was previous. With that being said he has discomfort to the point that I'm not really able to do much with this in order to remove the eschar and prove that everything is completely healed underneath. Nonetheless it's starting to look like this may lift up on its own and I think that may be the best course for him currently. 04/01/18 on evaluation today patient appears to be doing very well in fact his wound is completely healed today. He's been tolerating the dressing changes without complication. With that being said I do believe at this point that he is ready for discharge she still having some discomfort deeper and he tells me this may just be some irritation from the long periods of time he has to sit for dialysis nonetheless he's not having the redness that was previously noted I think he has done very well trying to keep pressure off of this area. Patient History Information obtained from Patient. Family History Diabetes - Father,Paternal Grandparents, Heart Disease - Father, Hypertension - Father, No family history of Cancer, Hereditary Spherocytosis, Kidney Disease, Lung Disease, Seizures, Stroke, Thyroid Problems, [name], [name]. ([id number]) Tuberculosis. Social  History Former smoker - quit in 1996, Marital Status - Married, Alcohol Use - Never - quit in 1976, Drug Use - No History, Caffeine Use - Never. 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 Psychiatric The patient has no complaints or symptoms. Objective Constitutional Thin and well-hydrated in no acute distress. Vitals Time Taken: 8:45 AM, Height: 71 in, Weight: 177 lbs, BMI: 24.7, Temperature: 97.7 F, Pulse: 69 bpm, Respiratory Rate: 18 breaths/min, Blood Pressure: 135/57 mmHg. Respiratory normal breathing without difficulty. Psychiatric this patient is able to make decisions and demonstrates good insight into disease process. Alert and Oriented x 3. pleasant and cooperative. General Notes: Patient has no wound opening  everything appears to be completely healed on evaluation today. Integumentary (Hair, Skin) Wound #5 status is Healed - Epithelialized. Original cause of wound was Pressure Injury. The wound is located on the Sacrum. The wound measures 0cm length x 0cm width x 0cm depth; 0cm^2 area and 0cm^3 volume. There is no tunneling or undermining noted. There is a none present amount of drainage noted. The wound margin is flat and intact. There is no granulation within the wound bed. There is a small (1-33%) amount of necrotic tissue within the wound bed. The periwound skin appearance exhibited: Scarring, Erythema. The periwound skin appearance did not exhibit: Callus, Crepitus, Excoriation, Induration, Rash, Dry/Scaly, Maceration, Atrophie Blanche, Cyanosis, Ecchymosis, Hemosiderin Staining, Mottled, Pallor, Rubor. The surrounding wound skin color is noted with erythema which is circumferential. Periwound temperature was noted as No Abnormality. The periwound has tenderness on  palpation. [name], [name] ([id number]) Assessment Active Problems ICD-10 Pressure ulcer of sacral region, stage 3 Type 2 diabetes mellitus with other skin ulcer Dependence on renal dialysis Plan [facility]charge From [name]: Discharge from [place] pressure off of area and reposition often. Please call our office if you have any questions or concerns. At this point I'm gonna recommend that we discontinue wound care services since the patient appears to be doing well and everything is completely healed. I recommended continue repositioning in order to take pressure off of the region and ensure that there is no further breakdown. He has done a great job of this I would like for him to continue what he has been doing up to this point. He's in agreement with that plan. Electronic Signature(s) Signed: 04/01/2018 11:53:57 PM By: Johnny Keeler PA-C Entered By: [name] on 04/01/2018 09:48:05 [name], [name] ([id number]) -------------------------------------------------------------------------------- ROS/PFSH Details Patient Name: [name]. Date of Service: 04/01/2018 9:00 AM Medical Record Number: [id number] Patient Account Number: [id number] Date of Birth/Sex: 27-Dec-1940 (76 y.o. M) Treating RN: [name] Primary Care Provider: Tedra [name] Other Clinician: Referring Provider: Tedra [name] Treating Provider/Extender: [name], [name] Weeks in Treatment: 7 Information Obtained From Patient Wound History Do you currently have one or more open woundso Yes How many open wounds do you currently haveo 1 Approximately how long have you had your woundso 3 or 4 weeks How have you been treating your wound(s) until nowo hydrafera blue Has your wound(s) ever healed and then re-openedo No Have you had any lab work done in the past montho No Have you tested positive for an antibiotic resistant organism (MRSA, VRE)o No Have you tested positive for osteomyelitis  (bone infection)o No Have you had any tests for circulation on your legso No Constitutional Symptoms (General Health) Complaints and Symptoms: Negative for: Fever; Chills Eyes Medical History: Negative for: Cataracts; Glaucoma; Optic Neuritis Ear/Nose/Mouth/Throat Medical History: Negative for: Chronic sinus problems/congestion; Middle ear problems Hematologic/Lymphatic Medical History: Positive for: Anemia Negative for: Hemophilia; Human Immunodeficiency Virus; Lymphedema; Sickle Cell Disease Cardiovascular Medical History: Positive for: Hypertension; Peripheral Venous Disease Negative for: Angina; Arrhythmia; Congestive Heart Failure; Coronary Artery Disease; Deep Vein Thrombosis; Hypotension; Myocardial Infarction; Peripheral Arterial Disease; Phlebitis; Vasculitis Past Medical History Notes: artificial valve, hyperlipidemia Gastrointestinal Medical History: Negative for: Cirrhosis ; Colitis; Crohnos; Hepatitis A; Hepatitis B; Hepatitis C Past Medical History Notes: scar tissue from multiple surgeries [name], [name] ([id number]) Endocrine Medical History: Positive for: Type [name] Diabetes Negative for: Type I Diabetes Treated with: Insulin Genitourinary Medical History: 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 Immunological Medical History: Negative for: Lupus Erythematosus; Raynaudos; Scleroderma Integumentary (Skin) Medical History: Negative for: History of Burn; History of pressure wounds Musculoskeletal Medical History: Positive for: Gout Negative for: Rheumatoid Arthritis; Osteoarthritis; Osteomyelitis Neurologic Medical History: Positive for: Neuropathy Negative for: Dementia; Quadriplegia; Paraplegia; Seizure Disorder Oncologic Medical History: Negative for: Received Chemotherapy; Received Radiation Psychiatric Complaints and Symptoms: No Complaints or Symptoms Medical History: Negative for: Anorexia/bulimia; Confinement  Anxiety Immunizations Pneumococcal Vaccine: Received Pneumococcal Vaccination: Yes Immunization Notes: uo to date Implantable Devices Family and Social History Cancer: No; Diabetes: Yes - Father,Paternal Grandparents; Heart Disease: Yes - Father; Hereditary Spherocytosis: No; Hypertension: Yes - Father; Kidney Disease: No; Lung Disease: No; Seizures: No; Stroke: No; Thyroid Problems: No; Hilgert, CASSELL VOORHIES ([id number]) Tuberculosis: No; Former smoker - quit in 1996; Marital Status - Married; Alcohol Use: Never - quit in 1976; Drug Use: No History; Caffeine Use: Never; Financial Concerns: No; Food, Clothing or Shelter Needs: No; Support System Lacking: No; Transportation Concerns: No; Advanced Directives: No; Patient does not want information on Advanced Directives Physician Affirmation I have reviewed and agree with the above information. Electronic Signature(s) Signed: 04/01/2018 5:13:20 PM By: Alric Quan Signed: 04/01/2018 11:53:57 PM By: Johnny Keeler PA-C Entered By: [name] on 04/01/2018 09:47:14 Whetsell, [name] ([id number]) -------------------------------------------------------------------------------- SuperBill Details Patient Name: [name]. Date of Service: 04/01/2018 Medical Record Number: [id number] Patient Account Number: [id number] Date of Birth/Sex: 1941-08-28 (76 y.o. 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, stage 3 E11.622 Type 2 diabetes mellitus with other skin ulcer Z99.2 Dependence on renal dialysis Facility Procedures CPT4 Code: 16010932 Description: [phone] - WOUND CARE VISIT-LEV 2 EST PT Modifier: Quantity: 1 Physician Procedures CPT4 Code: 2202542 Description: [phone] - WC PHYS LEVEL 2 - EST PT ICD-10 Diagnosis  Description 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

## 2018-04-02 NOTE — Progress Notes (Signed)
[name], [name] ([id number]) Visit Report for 04/01/2018 Arrival Information Details Patient Name: [name], [name]. Date of Service: 04/01/2018 9:00 AM Medical Record Number: [id number] Patient Account Number: [id number] Date of Birth/Sex: May 17, 1941 (77 y.o. M) Treating RN: Secundino Ginger Primary Care Sahirah Rudell: Tedra Senegal Other Clinician: Referring Danyell Awbrey: Tedra Senegal Treating Silva Aamodt/Extender: [name], HOYT Weeks in Treatment: 7 Visit Information History Since Last Visit Added or deleted any medications: No Patient Arrived: Cane Any new allergies or adverse reactions: No Arrival Time: 08:53 Had a fall or experienced change in No Accompanied By: self activities of daily living that may affect Transfer Assistance: None risk of falls: Patient Has Alerts: Yes Signs or symptoms of abuse/neglect since last visito No Patient Alerts: Patient on Blood Thinner Hospitalized since last visit: No Implantable device outside of the clinic excluding No cellular tissue based products placed in the center since last visit: Has Dressing in Place as Prescribed: Yes Pain Present Now: No Electronic Signature(s) Signed: 04/01/2018 4:03:40 PM By: Secundino Ginger Entered By: Secundino Ginger on 04/01/2018 08:55:04 [name], [name] ([id number]) -------------------------------------------------------------------------------- Clinic Level of Care Assessment Details Patient Name: [name]. Date of Service: 04/01/2018 9:00 AM Medical Record Number: [id number] Patient Account Number: [id number] Date of Birth/Sex: [birth year] (77 y.o. M) Treating RN: Ahmed Prima Primary Care Malynn Lucy: Tedra Senegal Other Clinician: Referring Endya Austin: Tedra Senegal Treating Imunique Samad/Extender: [name], HOYT Weeks in Treatment: 7 Clinic Level of Care Assessment Items TOOL 4 Quantity Score X - Use when only an EandM is performed on FOLLOW-UP visit 1 0 ASSESSMENTS - Nursing Assessment / Reassessment X - Reassessment of  Co-morbidities (includes updates in patient status) 1 10 X- 1 5 Reassessment of Adherence to Treatment Plan ASSESSMENTS - Wound and Skin Assessment / Reassessment X - Simple Wound Assessment / Reassessment - one wound 1 5 []  - 0 Complex Wound Assessment / Reassessment - multiple wounds []  - 0 Dermatologic / Skin Assessment (not related to wound area) ASSESSMENTS - Focused Assessment []  - Circumferential Edema Measurements - multi extremities 0 []  - 0 Nutritional Assessment / Counseling / Intervention []  - 0 Lower Extremity Assessment (monofilament, tuning fork, pulses) []  - 0 Peripheral Arterial Disease Assessment (using hand held doppler) ASSESSMENTS - Ostomy and/or Continence Assessment and Care []  - Incontinence Assessment and Management 0 []  - 0 Ostomy Care Assessment and Management (repouching, etc.) PROCESS - Coordination of Care X - Simple Patient / Family Education for ongoing care 1 15 []  - 0 Complex (extensive) Patient / Family Education for ongoing care []  - 0 Staff obtains Programmer, systems, Records, Test Results / Process Orders []  - 0 Staff telephones HHA, Nursing Homes / Clarify orders / etc []  - 0 Routine Transfer to another Facility (non-emergent condition) []  - 0 Routine Hospital Admission (non-emergent condition) []  - 0 New Admissions / Biomedical engineer / Ordering NPWT, Apligraf, etc. []  - 0 Emergency Hospital Admission (emergent condition) X- 1 10 Simple Discharge Coordination [name], [name] ([id number]) []  - 0 Complex (extensive) Discharge Coordination PROCESS - Special Needs []  - Pediatric / Minor Patient Management 0 []  - 0 Isolation Patient Management []  - 0 Hearing / Language / Visual special needs []  - 0 Assessment of Community assistance (transportation, D/C planning, etc.) []  - 0 Additional assistance / Altered mentation []  - 0 Support Surface(s) Assessment (bed, cushion, seat, etc.) INTERVENTIONS - Wound Cleansing / Measurement X -  Simple Wound Cleansing - one wound 1 5 []  - 0 Complex Wound Cleansing - multiple wounds X- 1 5  Wound Imaging (photographs - any number of wounds) []  - 0 Wound Tracing (instead of photographs) []  - 0 Simple Wound Measurement - one wound []  - 0 Complex Wound Measurement - multiple wounds INTERVENTIONS - Wound Dressings []  - Small Wound Dressing one or multiple wounds 0 []  - 0 Medium Wound Dressing one or multiple wounds []  - 0 Large Wound Dressing one or multiple wounds []  - 0 Application of Medications - topical []  - 0 Application of Medications - injection INTERVENTIONS - Miscellaneous []  - External ear exam 0 []  - 0 Specimen Collection (cultures, biopsies, blood, body fluids, etc.) []  - 0 Specimen(s) / Culture(s) sent or taken to Lab for analysis []  - 0 Patient Transfer (multiple staff / Civil Service fast streamer / Similar devices) []  - 0 Simple Staple / Suture removal (25 or less) []  - 0 Complex Staple / Suture removal (26 or more) []  - 0 Hypo / Hyperglycemic Management (close monitor of Blood Glucose) []  - 0 Ankle / Brachial Index (ABI) - do not check if billed separately X- 1 5 Vital Signs [name], [name] Has the patient been seen at the hospital within the last three years: Yes Total Score: 60 Level Of Care: New/Established - Level 2 Electronic Signature(s) Signed: 04/01/2018 5:13:20 PM By: Alric Quan Entered By: Alric Quan on 04/01/2018 09:46:41 [name], [name] -------------------------------------------------------------------------------- Encounter Discharge Information Details Patient Name: [name]. Date of Service: 04/01/2018 9:00 AM Medical Record Number: [id number] Patient Account Number: [id number] Date of Birth/Sex: 1941-04-29 (77 y.o. M) Treating RN: Ahmed Prima Primary Care Velma Hanna: Tedra Senegal Other Clinician: Referring Cristobal Advani: Tedra Senegal Treating Nahia Nissan/Extender: [name], HOYT Weeks in Treatment:  7 Encounter Discharge Information Items Discharge Condition: Stable Ambulatory Status: Cane Discharge Destination: Home Transportation: Private Auto Accompanied By: self Schedule Follow-up Appointment: No Clinical Summary of Care: Electronic Signature(s) Signed: 04/01/2018 5:13:20 PM By: Alric Quan Entered By: Alric Quan on 04/01/2018 09:45:49 Rotondo, [name] ([id number]) -------------------------------------------------------------------------------- Lower Extremity Assessment Details Patient Name: [name]. Date of Service: 04/01/2018 9:00 AM Medical Record Number: [id number] Patient Account Number: [id number] Date of Birth/Sex: [birth year] (77 y.o. M) Treating RN: Secundino Ginger Primary Care Kimara Bencomo: Tedra Senegal Other Clinician: Referring Kherington Meraz: Tedra Senegal Treating Billee Balcerzak/Extender: [name], HOYT Weeks in Treatment: 7 Electronic Signature(s) Signed: 04/01/2018 4:03:40 PM By: Secundino Ginger Entered By: Secundino Ginger on 04/01/2018 09:00:24 Herren, [name] ([id number]) -------------------------------------------------------------------------------- Multi Wound Chart Details Patient Name: [name]. Date of Service: 04/01/2018 9:00 AM Medical Record Number: [id number] Patient Account Number: [id number] Date of Birth/Sex: [birth year] (77 y.o. M) Treating RN: Ahmed Prima Primary Care Makesha Belitz: Tedra Senegal Other Clinician: Referring Tailor Westfall: Tedra Senegal Treating Mykaila Blunck/Extender: [name], HOYT Weeks in Treatment: 7 Vital Signs Height(in): 71 Pulse(bpm): 69 Weight(lbs): 177 Blood Pressure(mmHg): 135/57 Body Mass Index(BMI): 25 Temperature(F): 97.7 Respiratory Rate 18 (breaths/min): Photos: [N/A:N/A] Wound Location: Sacrum N/A N/A Wounding Event: Pressure Injury N/A N/A Primary Etiology: Pressure Ulcer N/A N/A Comorbid History: Anemia, Hypertension, N/A N/A Peripheral Venous Disease, Type II Diabetes, End Stage Renal Disease,  Gout, Neuropathy Date Acquired: 01/21/2018 N/A N/A Weeks of Treatment: 7 N/A N/A Wound Status: Open N/A N/A Measurements L x W x D 0.1x0.1x0.1 N/A N/A (cm) Area (cm) : 0.008 N/A N/A Volume (cm) : 0.001 N/A N/A % Reduction in Area: 97.90% N/A N/A % Reduction in Volume: 97.40% N/A N/A Classification: Category/Stage III N/A N/A Exudate Amount: None Present N/A N/A Wound Margin: Flat and Intact N/A N/A Granulation Amount: None Present (0%) N/A  N/A Necrotic Amount: Small (1-33%) N/A N/A Exposed Structures: Fascia: No N/A N/A Fat Layer (Subcutaneous Tissue) Exposed: No Tendon: No Muscle: No Joint: No Bone: No Giuliani, [name] ([id number]) Epithelialization: Small (1-33%) N/A N/A Periwound Skin Texture: Scarring: Yes N/A N/A Excoriation: No Induration: No Callus: No Crepitus: No Rash: No Periwound Skin Moisture: Maceration: No N/A N/A Dry/Scaly: No Periwound Skin Color: Erythema: Yes N/A N/A Atrophie Blanche: No Cyanosis: No Ecchymosis: No Hemosiderin Staining: No Mottled: No Pallor: No Rubor: No Erythema Location: Circumferential N/A N/A Temperature: No Abnormality N/A N/A Tenderness on Palpation: Yes N/A N/A Wound Preparation: Ulcer Cleansing: N/A N/A Rinsed/Irrigated with Saline Topical Anesthetic Applied: Other: lidocaine 4% Treatment Notes Electronic Signature(s) Signed: 04/01/2018 5:13:20 PM By: Alric Quan Entered By: Alric Quan on 04/01/2018 09:43:33 Jollie, [name] ([id number]) -------------------------------------------------------------------------------- Clare Details Patient Name: [name]. Date of Service: 04/01/2018 9:00 AM Medical Record Number: [id number] Patient Account Number: [id number] Date of Birth/Sex: [birth year] (77 y.o. M) Treating RN: Ahmed Prima Primary Care Wilba Mutz: Tedra Senegal Other Clinician: Referring Karmin Kasprzak: Tedra Senegal Treating Dakoda Laventure/Extender: [name], HOYT Weeks in  Treatment: 7 Active Inactive Electronic Signature(s) Signed: 04/01/2018 5:13:20 PM By: Alric Quan Entered By: Alric Quan on 04/01/2018 09:43:25 Ostenson, [name] ([id number]) -------------------------------------------------------------------------------- Pain Assessment Details Patient Name: [name]. Date of Service: 04/01/2018 9:00 AM Medical Record Number: [id number] Patient Account Number: [id number] Date of Birth/Sex: 1941/06/12 (77 y.o. M) Treating RN: Secundino Ginger Primary Care Spencer Cardinal: Tedra Senegal Other Clinician: Referring Treyven Lafauci: Tedra Senegal Treating Frona Yost/Extender: [name], HOYT Weeks in Treatment: 7 Active Problems Location of Pain Severity and Description of Pain Patient Has Paino No Site Locations Pain Management and Medication Current Pain Management: Goals for Pain Management topical or injectable lidocaine is offered to patient for acute pain when surgical debridement is performed. if needed, Patient is instructed to use over the counter pain medication for the following 24-48 hours after debridement. wound care MDs do not prescribe pain medications. patient has chronic pain or uncontrolled pain. patient has been instructed to make appointment with their primary care physician for pain management. Electronic Signature(s) Signed: 04/01/2018 4:03:40 PM By: Secundino Ginger Entered By: Secundino Ginger on 04/01/2018 09:07:13 Hakeem, [name] ([id number]) -------------------------------------------------------------------------------- Patient/Caregiver Education Details Patient Name: [name]. Date of Service: 04/01/2018 9:00 AM Medical Record Number: [id number] Patient Account Number: [id number] Date of Birth/Gender: June 02, 1941 (77 y.o. M) Treating RN: Ahmed Prima Primary Care Physician: Tedra Senegal Other Clinician: Referring Physician: Tedra Senegal Treating Physician/Extender: Sharalyn Ink in Treatment: 7 Education Assessment Education  Provided To: Patient Education Topics Provided Wound/Skin Impairment: Handouts: Other: Keep pressure off of area and reposition often. Please call our office if you have any questions. Methods: Explain/Verbal Responses: State content correctly Electronic Signature(s) Signed: 04/01/2018 5:13:20 PM By: Alric Quan Entered By: Alric Quan on 04/01/2018 09:46:05 Dicenso, [name] ([id number]) -------------------------------------------------------------------------------- Wound Assessment Details Patient Name: [name]. Date of Service: 04/01/2018 9:00 AM Medical Record Number: [id number] Patient Account Number: [id number] Date of Birth/Sex: 1941/05/12 (77 y.o. M) Treating RN: Ahmed Prima Primary Care Cherica Heiden: Tedra Senegal Other Clinician: Referring Parley Pidcock: Tedra Senegal Treating Josede Cicero/Extender: [name], HOYT Weeks in Treatment: 7 Wound Status Wound Number: 5 Primary Pressure Ulcer Etiology: Wound Location: Sacrum Wound Healed - Epithelialized Wounding Event: Pressure Injury Status: Date Acquired: 01/21/2018 Comorbid Anemia, Hypertension, Peripheral Venous Weeks Of Treatment: 7 History: Disease, Type II Diabetes, End Stage Renal Clustered Wound: No Disease, Gout, Neuropathy Photos Photo Uploaded By: Secundino Ginger  on 04/01/2018 09:05:56 Wound Measurements Length: (cm) 0 % Red Width: (cm) 0 % Red Depth: (cm) 0 Epith Area: (cm) 0 Tunn Volume: (cm) 0 Unde uction in Area: 100% uction in Volume: 100% elialization: Small (1-33%) eling: No rmining: No Wound Description Classification: Category/Stage III Wound Margin: Flat and Intact Exudate Amount: None Present Foul Odor After Cleansing: No Slough/Fibrino No Wound Bed Granulation Amount: None Present (0%) Exposed Structure Necrotic Amount: Small (1-33%) Fascia Exposed: No Fat Layer (Subcutaneous Tissue) Exposed: No Tendon Exposed: No Muscle Exposed: No Joint Exposed: No Bone Exposed: No Periwound  Skin Texture Texture Color No Abnormalities Noted: No No Abnormalities Noted: No Ducksworth, [name] ([id number]) Callus: No Atrophie Blanche: No Crepitus: No Cyanosis: No Excoriation: No Ecchymosis: No Induration: No Erythema: Yes Rash: No Erythema Location: Circumferential Scarring: Yes Hemosiderin Staining: No Mottled: No Moisture Pallor: No No Abnormalities Noted: No Rubor: No Dry / Scaly: No Maceration: No Temperature / Pain Temperature: No Abnormality Tenderness on Palpation: Yes Wound Preparation Ulcer Cleansing: Rinsed/Irrigated with Saline Topical Anesthetic Applied: Other: lidocaine 4%, Electronic Signature(s) Signed: 04/01/2018 5:13:20 PM By: Alric Quan Entered By: Alric Quan on 04/01/2018 09:43:57 Felkins, [name] ([id number]) -------------------------------------------------------------------------------- Vitals Details Patient Name: [name]. Date of Service: 04/01/2018 9:00 AM Medical Record Number: [id number] Patient Account Number: [id number] Date of Birth/Sex: 20-Oct-1940 (77 y.o. M) Treating RN: Secundino Ginger Primary Care Justyn Langham: Tedra Senegal Other Clinician: Referring Gamal Todisco: Tedra Senegal Treating Sache Sane/Extender: [name], HOYT Weeks in Treatment: 7 Vital Signs Time Taken: 08:45 Temperature (F): 97.7 Height (in): 71 Pulse (bpm): 69 Weight (lbs): 177 Respiratory Rate (breaths/min): 18 Body Mass Index (BMI): 24.7 Blood Pressure (mmHg): 135/57 Reference Range: 80 - 120 mg / dl Electronic Signature(s) Signed: 04/01/2018 4:03:40 PM By: Secundino Ginger Entered BySecundino Ginger on 04/01/2018 08:56:04

## 2018-04-04 DIAGNOSIS — D631 Anemia in chronic kidney disease: Secondary | ICD-10-CM | POA: Diagnosis not present

## 2018-04-04 DIAGNOSIS — E876 Hypokalemia: Secondary | ICD-10-CM | POA: Diagnosis not present

## 2018-04-04 DIAGNOSIS — N186 End stage renal disease: Secondary | ICD-10-CM | POA: Diagnosis not present

## 2018-04-04 DIAGNOSIS — N2581 Secondary hyperparathyroidism of renal origin: Secondary | ICD-10-CM | POA: Diagnosis not present

## 2018-04-04 DIAGNOSIS — D509 Iron deficiency anemia, unspecified: Secondary | ICD-10-CM | POA: Diagnosis not present

## 2018-04-04 DIAGNOSIS — E1129 Type 2 diabetes mellitus with other diabetic kidney complication: Secondary | ICD-10-CM | POA: Diagnosis not present

## 2018-04-07 DIAGNOSIS — E876 Hypokalemia: Secondary | ICD-10-CM | POA: Diagnosis not present

## 2018-04-07 DIAGNOSIS — E1129 Type 2 diabetes mellitus with other diabetic kidney complication: Secondary | ICD-10-CM | POA: Diagnosis not present

## 2018-04-07 DIAGNOSIS — N2581 Secondary hyperparathyroidism of renal origin: Secondary | ICD-10-CM | POA: Diagnosis not present

## 2018-04-07 DIAGNOSIS — D631 Anemia in chronic kidney disease: Secondary | ICD-10-CM | POA: Diagnosis not present

## 2018-04-07 DIAGNOSIS — N186 End stage renal disease: Secondary | ICD-10-CM | POA: Diagnosis not present

## 2018-04-07 DIAGNOSIS — D509 Iron deficiency anemia, unspecified: Secondary | ICD-10-CM | POA: Diagnosis not present

## 2018-04-08 DIAGNOSIS — L218 Other seborrheic dermatitis: Secondary | ICD-10-CM | POA: Diagnosis not present

## 2018-04-09 DIAGNOSIS — D631 Anemia in chronic kidney disease: Secondary | ICD-10-CM | POA: Diagnosis not present

## 2018-04-09 DIAGNOSIS — E876 Hypokalemia: Secondary | ICD-10-CM | POA: Diagnosis not present

## 2018-04-09 DIAGNOSIS — D509 Iron deficiency anemia, unspecified: Secondary | ICD-10-CM | POA: Diagnosis not present

## 2018-04-09 DIAGNOSIS — E1129 Type 2 diabetes mellitus with other diabetic kidney complication: Secondary | ICD-10-CM | POA: Diagnosis not present

## 2018-04-09 DIAGNOSIS — N186 End stage renal disease: Secondary | ICD-10-CM | POA: Diagnosis not present

## 2018-04-09 DIAGNOSIS — N2581 Secondary hyperparathyroidism of renal origin: Secondary | ICD-10-CM | POA: Diagnosis not present

## 2018-04-11 DIAGNOSIS — N2581 Secondary hyperparathyroidism of renal origin: Secondary | ICD-10-CM | POA: Diagnosis not present

## 2018-04-11 DIAGNOSIS — E1129 Type 2 diabetes mellitus with other diabetic kidney complication: Secondary | ICD-10-CM | POA: Diagnosis not present

## 2018-04-11 DIAGNOSIS — D509 Iron deficiency anemia, unspecified: Secondary | ICD-10-CM | POA: Diagnosis not present

## 2018-04-11 DIAGNOSIS — D631 Anemia in chronic kidney disease: Secondary | ICD-10-CM | POA: Diagnosis not present

## 2018-04-11 DIAGNOSIS — E876 Hypokalemia: Secondary | ICD-10-CM | POA: Diagnosis not present

## 2018-04-11 DIAGNOSIS — N186 End stage renal disease: Secondary | ICD-10-CM | POA: Diagnosis not present

## 2018-04-14 ENCOUNTER — Other Ambulatory Visit (HOSPITAL_COMMUNITY): Payer: Self-pay | Admitting: Nephrology

## 2018-04-14 DIAGNOSIS — I10 Essential (primary) hypertension: Secondary | ICD-10-CM

## 2018-04-14 DIAGNOSIS — D509 Iron deficiency anemia, unspecified: Secondary | ICD-10-CM | POA: Diagnosis not present

## 2018-04-14 DIAGNOSIS — E876 Hypokalemia: Secondary | ICD-10-CM | POA: Diagnosis not present

## 2018-04-14 DIAGNOSIS — N186 End stage renal disease: Secondary | ICD-10-CM | POA: Diagnosis not present

## 2018-04-14 DIAGNOSIS — D631 Anemia in chronic kidney disease: Secondary | ICD-10-CM | POA: Diagnosis not present

## 2018-04-14 DIAGNOSIS — N2581 Secondary hyperparathyroidism of renal origin: Secondary | ICD-10-CM | POA: Diagnosis not present

## 2018-04-14 DIAGNOSIS — E1129 Type 2 diabetes mellitus with other diabetic kidney complication: Secondary | ICD-10-CM | POA: Diagnosis not present

## 2018-04-15 ENCOUNTER — Telehealth: Payer: Self-pay

## 2018-04-15 ENCOUNTER — Encounter (HOSPITAL_COMMUNITY)
Admission: RE | Admit: 2018-04-15 | Discharge: 2018-04-15 | Disposition: A | Discharge status: Home / Self Care | Payer: Medicare Other | Source: Ambulatory Visit | Attending: Gastroenterology | Admitting: Gastroenterology

## 2018-04-15 DIAGNOSIS — R630 Anorexia: Secondary | ICD-10-CM | POA: Diagnosis not present

## 2018-04-15 DIAGNOSIS — K5909 Other constipation: Secondary | ICD-10-CM | POA: Diagnosis not present

## 2018-04-15 DIAGNOSIS — R197 Diarrhea, unspecified: Secondary | ICD-10-CM | POA: Diagnosis not present

## 2018-04-15 MED ORDER — TECHNETIUM TC 99M SULFUR COLLOID
2.1000 | Freq: Once | INTRAVENOUS | Status: AC | PRN
  Administered 2018-04-15: 2.1 via INTRAVENOUS

## 2018-04-15 NOTE — Telephone Encounter (Signed)
Results given to Mrs. [name]. She asks if there is a medicine that will help with his digestion. He is a dialysis patient 3 days a week. There may be an RD on staff to assist with restructuring his diet.  I can call the dialysis center and ask if this is an option.

## 2018-04-16 ENCOUNTER — Other Ambulatory Visit: Payer: Self-pay | Admitting: Endocrinology

## 2018-04-16 DIAGNOSIS — E876 Hypokalemia: Secondary | ICD-10-CM | POA: Diagnosis not present

## 2018-04-16 DIAGNOSIS — E1165 Type 2 diabetes mellitus with hyperglycemia: Secondary | ICD-10-CM

## 2018-04-16 DIAGNOSIS — M8000XD Age-related osteoporosis with current pathological fracture, unspecified site, subsequent encounter for fracture with routine healing: Secondary | ICD-10-CM

## 2018-04-16 DIAGNOSIS — N186 End stage renal disease: Secondary | ICD-10-CM | POA: Diagnosis not present

## 2018-04-16 DIAGNOSIS — D509 Iron deficiency anemia, unspecified: Secondary | ICD-10-CM | POA: Diagnosis not present

## 2018-04-16 DIAGNOSIS — Z794 Long term (current) use of insulin: Principal | ICD-10-CM

## 2018-04-16 DIAGNOSIS — N2581 Secondary hyperparathyroidism of renal origin: Secondary | ICD-10-CM | POA: Diagnosis not present

## 2018-04-16 DIAGNOSIS — D631 Anemia in chronic kidney disease: Secondary | ICD-10-CM | POA: Diagnosis not present

## 2018-04-16 DIAGNOSIS — E1129 Type 2 diabetes mellitus with other diabetic kidney complication: Secondary | ICD-10-CM | POA: Diagnosis not present

## 2018-04-17 ENCOUNTER — Other Ambulatory Visit (HOSPITAL_COMMUNITY): Payer: Medicare Other

## 2018-04-17 ENCOUNTER — Other Ambulatory Visit (INDEPENDENT_AMBULATORY_CARE_PROVIDER_SITE_OTHER): Payer: Medicare Other

## 2018-04-17 DIAGNOSIS — Z794 Long term (current) use of insulin: Secondary | ICD-10-CM

## 2018-04-17 DIAGNOSIS — E1165 Type 2 diabetes mellitus with hyperglycemia: Secondary | ICD-10-CM | POA: Diagnosis not present

## 2018-04-17 DIAGNOSIS — M8000XD Age-related osteoporosis with current pathological fracture, unspecified site, subsequent encounter for fracture with routine healing: Secondary | ICD-10-CM | POA: Diagnosis not present

## 2018-04-17 LAB — HEMOGLOBIN A1C: Hgb A1c MFr Bld: 6.3 % (ref 4.6–6.5)

## 2018-04-17 LAB — GLUCOSE, RANDOM: Glucose, Bld: 123 mg/dL — ABNORMAL HIGH (ref 70–99)

## 2018-04-17 LAB — CALCIUM: Calcium: 7.9 mg/dL — ABNORMAL LOW (ref 8.4–10.5)

## 2018-04-17 NOTE — Telephone Encounter (Signed)
Spoke with "Lucita Ferrara" at the dialysis center. She has already spoken to the patient and wife.  Left message for the wife that there is no medication to treat this issue.

## 2018-04-17 NOTE — Telephone Encounter (Signed)
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 He will need dietary adjustments. Please refer patient to dietician . Thanks

## 2018-04-18 DIAGNOSIS — N2581 Secondary hyperparathyroidism of renal origin: Secondary | ICD-10-CM | POA: Diagnosis not present

## 2018-04-18 DIAGNOSIS — D631 Anemia in chronic kidney disease: Secondary | ICD-10-CM | POA: Diagnosis not present

## 2018-04-18 DIAGNOSIS — D509 Iron deficiency anemia, unspecified: Secondary | ICD-10-CM | POA: Diagnosis not present

## 2018-04-18 DIAGNOSIS — N186 End stage renal disease: Secondary | ICD-10-CM | POA: Diagnosis not present

## 2018-04-18 DIAGNOSIS — E1129 Type 2 diabetes mellitus with other diabetic kidney complication: Secondary | ICD-10-CM | POA: Diagnosis not present

## 2018-04-18 DIAGNOSIS — E876 Hypokalemia: Secondary | ICD-10-CM | POA: Diagnosis not present

## 2018-04-18 LAB — FRUCTOSAMINE: Fructosamine: 289 umol/L — ABNORMAL HIGH (ref 0–285)

## 2018-04-19 ENCOUNTER — Other Ambulatory Visit: Payer: Self-pay | Admitting: Endocrinology

## 2018-04-21 DIAGNOSIS — E1129 Type 2 diabetes mellitus with other diabetic kidney complication: Secondary | ICD-10-CM | POA: Diagnosis not present

## 2018-04-21 DIAGNOSIS — N186 End stage renal disease: Secondary | ICD-10-CM | POA: Diagnosis not present

## 2018-04-21 DIAGNOSIS — N2581 Secondary hyperparathyroidism of renal origin: Secondary | ICD-10-CM | POA: Diagnosis not present

## 2018-04-21 DIAGNOSIS — D631 Anemia in chronic kidney disease: Secondary | ICD-10-CM | POA: Diagnosis not present

## 2018-04-21 DIAGNOSIS — D509 Iron deficiency anemia, unspecified: Secondary | ICD-10-CM | POA: Diagnosis not present

## 2018-04-21 DIAGNOSIS — E876 Hypokalemia: Secondary | ICD-10-CM | POA: Diagnosis not present

## 2018-04-22 ENCOUNTER — Encounter: Payer: Self-pay | Admitting: Endocrinology

## 2018-04-22 ENCOUNTER — Other Ambulatory Visit: Payer: Self-pay

## 2018-04-22 ENCOUNTER — Ambulatory Visit (INDEPENDENT_AMBULATORY_CARE_PROVIDER_SITE_OTHER): Payer: Medicare Other | Admitting: Endocrinology

## 2018-04-22 VITALS — BP 122/70 | HR 88 | Ht 71.0 in | Wt 157.0 lb

## 2018-04-22 DIAGNOSIS — E1165 Type 2 diabetes mellitus with hyperglycemia: Secondary | ICD-10-CM

## 2018-04-22 DIAGNOSIS — Z794 Long term (current) use of insulin: Secondary | ICD-10-CM | POA: Diagnosis not present

## 2018-04-22 MED ORDER — LIRAGLUTIDE 18 MG/3ML ~~LOC~~ SOPN: 0.6000 mg | PEN_INJECTOR | Freq: Every day | SUBCUTANEOUS | 1 refills | Status: DC

## 2018-04-22 MED ORDER — GLUCOSE BLOOD VI STRP: ORAL_STRIP | 12 refills | Status: DC

## 2018-04-22 MED ORDER — INSULIN PEN NEEDLE 31G X 5 MM MISC
1 refills | Status: DC
Start: 2018-04-22 — End: 2018-08-26

## 2018-04-22 NOTE — Progress Notes (Signed)
Patient ID: [name], male   DOB: 01/19/1941, 77 y.o.   MRN: [id number]           Reason for Appointment:  Follow-up for endocrinology  Referring physician: [place]  History of Present Illness:          Date of diagnosis of type 2 diabetes mellitus:  1996      Background history:  He has had long-standing diabetes probably treated with metformin initially and subsequently with sulfonylurea drugs At some point he was also given Actos in addition to his glipizide which he is still taking Records of his control are only available for the last 4 years Over the last few years his A1c has been generally in the 6-7% range His A1c was 6.2 in May 2016 and apparently was starting to get low normal blood sugars at times He was admitted to the hospital for fluid overload and renal failure and at that time his Actos was stopped Subsequently his blood sugars had been markedly increased He was switched from regular insulin to NovoLog on his initial consultation  Recent history:    INSULIN regimen is described as: None, previously on Lantus 13 units in am; 10--? 5--10  units Novolog with meals with syringe  Oral hypoglycemic drugs: Tradjenta 5 mg daily  His A1c is 6.3 although previously lower  Dialysis is done at 7 am, doing it on Monday/Wednesday/Fridays     Current blood sugar patterns and problems identified:  He has been  on Tradjenta 5 mg daily for some time for his controller and not needing insulin again  He does think that his appetite is better compared to before and previously had lost a significant amount of weight which has leveled off  He is checking blood sugars again very infrequently and mostly in the mornings which are fairly good  Blood sugars are ranging from 99 up to 166 in the evenings with only 3 readings  Morning readings range from 75 up to 136  Overall average 112  Does not check readings after meals much   Oral hypoglycemic drugs the patient is taking  are: None Side effects from medications have been: None  Compliance with the medical regimen: Good   Glucose monitoring:  done 0-1  times a day         Glucometer:  Freestyle  Blood Glucose readings by review of monitor: Download as above  Average 112    Self-care: The diet that the patient has been following is: tries to limit sweets, portions. eating out only about once a week at a steak house        Typical meal intake: Breakfast is variable; supper 8 pm            Dietician visit, most recent: 08/2017               Exercise:  Not able to do any, in wheelchair  Weight history:  Wt Readings from Last 3 Encounters:  04/22/18 157 lb (71.2 kg)  02/25/18 157 lb 4.8 oz (71.4 kg)  02/20/18 158 lb 12.8 oz (72 kg)    Glycemic control:    Lab Results  Component Value Date   HGBA1C 6.3 04/17/2018   HGBA1C 5.4 01/18/2018   HGBA1C 6.2 01/07/2018   Lab Results  Component Value Date   MICROALBUR 37.2 09/03/2016   LDLCALC 21 09/05/2017   CREATININE 4.48 (H) 01/23/2018    Lab Results  Component Value Date   FRUCTOSAMINE 289 (H)  04/17/2018   FRUCTOSAMINE 272 02/20/2018   FRUCTOSAMINE 454 (H) 07/12/2017    PROBLEM 2 Osteoporosis: See review of systems       Allergies as of 04/22/2018      Reactions   Penicillins Rash   Has patient had a PCN reaction causing immediate rash, facial/tongue/throat swelling, SOB or lightheadedness with hypotension: Yes Has patient had a PCN reaction causing severe rash involving mucus membranes or skin necrosis: Yes Has patient had a PCN reaction that required hospitalization: No Has patient had a PCN reaction occurring within the last 10 years: No If all of the above answers are "NO", then may proceed with Cephalosporin use.      Medication List        Accurate as of 04/22/18  9:59 AM. Always use your most recent med list.          acetaminophen 500 MG tablet Commonly known as:  TYLENOL Take 1,000 mg by mouth 2 (two) times  daily as needed (for pain).   allopurinol 100 MG tablet Commonly known as:  ZYLOPRIM Take 100 mg by mouth daily as needed (pain).   aspirin 325 MG tablet Take 325 mg by mouth daily.   clobetasol 0.05 % external solution Commonly known as:  TEMOVATE apply to affected area ON THE SCALP UP TO 2 TIMES A DAY AS NEEDED...  (REFER TO PRESCRIPTION NOTES).   docusate sodium 100 MG capsule Commonly known as:  COLACE Take 1 capsule (100 mg total) by mouth daily. To avoid constipation from pain medication   donepezil 10 MG tablet Commonly known as:  ARICEPT Take 1 tablet (10 mg total) by mouth at bedtime.   Fluocinolone Acetonide Scalp 0.01 % Oil apply to affected area ON SCALP EVERY NIGHT AT BEDTIME, WASH UPON AWAKENING   Fluocinolone Acetonide Body 0.01 % Oil Apply 1 ml on the skin daily   fluticasone 0.05 % cream Commonly known as:  CUTIVATE apply to affected area ON FACE UP TO 2 TIMES A DAY AS NEEDED   fluticasone 50 MCG/ACT nasal spray Commonly known as:  FLONASE Place 1 spray into both nostrils daily.   FREESTYLE FREEDOM LITE w/Device Kit Use to check blood sugar 2 times per day dx code E11.65   freestyle lancets Use as instructed to check blood sugar 2 times per day dx code E11.65   glucose blood test strip Commonly known as:  ONETOUCH VERIO Use to check blood sugar 2 times per day. Dx code E11.65   glucose blood test strip Commonly known as:  FREESTYLE LITE Use as instructed   ketoconazole 2 % shampoo Commonly known as:  NIZORAL Apply 1 application topically 2 (two) times a week.   levothyroxine 50 MCG tablet Commonly known as:  SYNTHROID, LEVOTHROID Take 1 tablet (50 mcg total) by mouth daily.   loratadine 10 MG tablet Commonly known as:  CLARITIN Take 10 mg by mouth daily as needed for allergies.   methocarbamol 500 MG tablet Commonly known as:  ROBAXIN Take 1 tablet (500 mg total) by mouth at bedtime as needed for muscle spasms.   mupirocin ointment 2  % Commonly known as:  BACTROBAN Use as directed to affected areas   Naftifine HCl 2 % Crea Commonly known as:  NAFTIN Apply to feet daily for fungal infection   nystatin-triamcinolone cream Commonly known as:  MYCOLOG II Apply 1 application topically 2 (two) times daily.   omeprazole 40 MG capsule Commonly known as:  PRILOSEC Take 1 capsule (40  mg total) by mouth daily.   PROBIOTIC PO Take 1 tablet by mouth daily.   RENVELA 800 MG tablet Generic drug:  sevelamer carbonate Take 800-1,600 mg by mouth See admin instructions. Take 1 tablet with breakfast and at lunch then take 2 tablets at dinner and 800 mg with each snack   simvastatin 20 MG tablet Commonly known as:  ZOCOR Take 20 mg by mouth at bedtime.   TRADJENTA 5 MG Tabs tablet Generic drug:  linagliptin TAKE 1 TABLET(5 MG) BY MOUTH DAILY   triamcinolone ointment 0.1 % Commonly known as:  KENALOG Apply 1 application topically 2 (two) times daily. Use between thighs       Allergies:  Allergies  Allergen Reactions  . Penicillins Rash    Has patient had a PCN reaction causing immediate rash, facial/tongue/throat swelling, SOB or lightheadedness with hypotension: Yes Has patient had a PCN reaction causing severe rash involving mucus membranes or skin necrosis: Yes Has patient had a PCN reaction that required hospitalization: No Has patient had a PCN reaction occurring within the last 10 years: No If all of the above answers are "NO", then may proceed with Cephalosporin use.     Past Medical History:  Diagnosis Date  . Allergy   . Anemia   . Arthritis   . Cataract    bil cateracts removed  . Coronary artery disease   . Diabetes mellitus    Type 2  . Diverticulitis   . ED (erectile dysfunction)   . 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 ESRD (end stage renal disease) on dialysis ([place]) 03/2015  . GERD (gastroesophageal reflux disease)    pepto   . Gout   . Hyperlipidemia   . Hypertension   . Hypothyroidism    . Pneumonia   . PVD (peripheral vascular disease) ([place])    has plastic aorta  . Renal insufficiency   . Seasonal allergies   . Shortness of breath dyspnea   . Sleep apnea    does not wear c-pap  . Thyroid disease     Past Surgical History:  Procedure Laterality Date  . aortobifemoral bypass    . AV FISTULA PLACEMENT Left 12/01/2013   Procedure: ARTERIOVENOUS (AV) FISTULA CREATION- LEFT BRACHIOCEPHALIC;  Surgeon: Angelia Mould, MD;  Location: [facility];  Service: Vascular;  Laterality: Left;  . [place] TRANSPOSITION Right 07/27/2014   Procedure: BASCILIC VEIN TRANSPOSITION;  Surgeon: Angelia Mould, MD;  Location: [facility];  Service: Vascular;  Laterality: Right;  . BREAST SURGERY     left - granulomatous mastitis  . COLONOSCOPY    . 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 EYE SURGERY Bilateral    cataracts  . HEMODIALYSIS INPATIENT  01/17/2018      . REVISON OF ARTERIOVENOUS FISTULA Left 02/09/2014   Procedure: REVISON OF LEFT ARTERIOVENOUS FISTULA - RESECTION OF RENDUNDANT VEIN;  Surgeon: Angelia Mould, MD;  Location: [facility];  Service: Vascular;  Laterality: Left;  . SBO with lysis adhesions    . SHUNTOGRAM Left 04/19/2014   Procedure: FISTULOGRAM;  Surgeon: Angelia Mould, MD;  Location: [facility] CATH LAB;  Service: Cardiovascular;  Laterality: Left;  . UNILATERAL UPPER EXTREMEITY ANGIOGRAM N/A 07/12/2014   Procedure: UNILATERAL UPPER Anselmo Rod;  Surgeon: Angelia Mould, MD;  Location: [facility] CATH LAB;  Service: Cardiovascular;  Laterality: N/A;    Family History  Problem Relation Age of Onset  . Aneurysm Mother   . Heart  disease Father   . Stroke Father   . Hypertension Father   . Diabetes Father   . Dementia Neg Hx   . Colon cancer Neg Hx   . Esophageal cancer Neg Hx   . Pancreatic cancer Neg Hx   . Prostate cancer Neg Hx   . Rectal cancer Neg Hx   . Stomach cancer Neg Hx     Social History:  reports that he quit  smoking about 23 years ago. He has never used smokeless tobacco. He reports that he does not drink alcohol or use drugs.    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 he is not a candidate for Forteo and nephrologist suggested Prolia He has had his first injection of Prolia this year in 5/19 and has had no side effects so far However his calcium level is low, not clear if he is on any supplementation  He is reportedly taking Sensipar for secondary hyperparathyroidism, followed by nephrologist  Lab Results  Component Value Date   PTH 154 (H) 10/06/2014   CALCIUM 7.9 (L) 04/17/2018   CAION 1.44 (H) 07/12/2014   PHOS 4.2 01/10/2016     Lipid history: He has been treated with Simvastatin 20 mg  by PCP, previously on Atorvastatin    Lab Results  Component Value Date   CHOL 79 09/05/2017   HDL 38.00 (L) 09/05/2017   LDLCALC 21 09/05/2017   TRIG 99.0 09/05/2017   CHOLHDL 2 09/05/2017          He has ESRD on dialysis    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  Mild hypothyroidism: On 50 mcg levothyroxine with normal levels  Lab Results  Component Value Date   TSH 0.90 02/20/2018       Physical Examination:  BP 122/70 (BP Location: Left Arm, Patient Position: Sitting, Cuff Size: Normal)   Pulse 88   Ht _0  (1.803 m)   Wt 157 lb (71.2 kg)   SpO2 97%   BMI 21.90 kg/m       ASSESSMENT:  Diabetes type 2 recently not on insulin  See history of present illness for detailed discussion of his current management, blood sugar patterns and problems identified  His blood sugars are very well controlled A1c is 6.4  Although he is doing well with Tradjenta he is still having significant issues with diarrhea, lack of weight gain and inconsistent  appetite  Given the fact that he has rapid gastric emptying which may be associated with diabetes he may do better with using Victoza instead of Tradjenta   HYPOCALCEMIA: Total calcium is low and forwarded results to his nephrologist   PLAN:   Discussed use of Victoza and showed him how to use the pen This should be safe to use with his renal failure with the 0.6 mg dosage which may be adequate for his control He will call if he has any difficulties with this Stop Tradjenta  Start calcium carbonate 500 mg twice daily but he will check with his nephrologist to see if they would prefer a different calcium preparation  There are no Patient Instructions on file for this visit.        Elayne Snare 04/22/2018, 9:59 AM   Note: This office note was prepared  with Estate agent. Any transcriptional errors that result from this process are unintentional.

## 2018-04-22 NOTE — Patient Instructions (Addendum)
Change Tradjenta to 0.6 mg Victoza daily  Calcium 500 mg OTC and take twice a day

## 2018-04-23 ENCOUNTER — Telehealth: Payer: Self-pay

## 2018-04-23 DIAGNOSIS — N186 End stage renal disease: Secondary | ICD-10-CM | POA: Diagnosis not present

## 2018-04-23 DIAGNOSIS — N2581 Secondary hyperparathyroidism of renal origin: Secondary | ICD-10-CM | POA: Diagnosis not present

## 2018-04-23 DIAGNOSIS — E1129 Type 2 diabetes mellitus with other diabetic kidney complication: Secondary | ICD-10-CM | POA: Diagnosis not present

## 2018-04-23 DIAGNOSIS — D631 Anemia in chronic kidney disease: Secondary | ICD-10-CM | POA: Diagnosis not present

## 2018-04-23 DIAGNOSIS — E876 Hypokalemia: Secondary | ICD-10-CM | POA: Diagnosis not present

## 2018-04-23 DIAGNOSIS — D509 Iron deficiency anemia, unspecified: Secondary | ICD-10-CM | POA: Diagnosis not present

## 2018-04-23 NOTE — Telephone Encounter (Signed)
Noted. PA will be initiated.

## 2018-04-23 NOTE — Telephone Encounter (Signed)
Need prior authorization, Bydureon contraindicated with renal failure

## 2018-04-23 NOTE — Telephone Encounter (Signed)
Pt's insurance denied coverage for Victoza that you prescribed yesterday. The preferred medication is Bydureon. Would you like to switch the pt to this instead or attempt a PA because of contraindications for Bydureon?

## 2018-04-24 ENCOUNTER — Telehealth: Payer: Self-pay

## 2018-04-24 DIAGNOSIS — Z992 Dependence on renal dialysis: Secondary | ICD-10-CM | POA: Diagnosis not present

## 2018-04-24 DIAGNOSIS — N186 End stage renal disease: Secondary | ICD-10-CM | POA: Diagnosis not present

## 2018-04-24 DIAGNOSIS — E1122 Type 2 diabetes mellitus with diabetic chronic kidney disease: Secondary | ICD-10-CM | POA: Diagnosis not present

## 2018-04-24 NOTE — Telephone Encounter (Signed)
PA was submitted via [email or web address] and was instantly approved. [name] Case ID: [id number] Coverage start date is 03/25/2018 Coverage end date is 09/23/2098. [name] will be called and notified.

## 2018-04-24 NOTE — Telephone Encounter (Signed)
Pt was called and notified that his PA was approved and his medication can be picked up at any time. Pt verbalized understanding.

## 2018-04-25 DIAGNOSIS — T148XXA Other injury of unspecified body region, initial encounter: Secondary | ICD-10-CM | POA: Diagnosis not present

## 2018-04-25 DIAGNOSIS — D509 Iron deficiency anemia, unspecified: Secondary | ICD-10-CM | POA: Diagnosis not present

## 2018-04-25 DIAGNOSIS — E1129 Type 2 diabetes mellitus with other diabetic kidney complication: Secondary | ICD-10-CM | POA: Diagnosis not present

## 2018-04-25 DIAGNOSIS — N2581 Secondary hyperparathyroidism of renal origin: Secondary | ICD-10-CM | POA: Diagnosis not present

## 2018-04-25 DIAGNOSIS — N186 End stage renal disease: Secondary | ICD-10-CM | POA: Diagnosis not present

## 2018-04-25 DIAGNOSIS — D631 Anemia in chronic kidney disease: Secondary | ICD-10-CM | POA: Diagnosis not present

## 2018-04-28 DIAGNOSIS — N2581 Secondary hyperparathyroidism of renal origin: Secondary | ICD-10-CM | POA: Diagnosis not present

## 2018-04-28 DIAGNOSIS — N453 Epididymo-orchitis: Secondary | ICD-10-CM | POA: Diagnosis not present

## 2018-04-28 DIAGNOSIS — E1129 Type 2 diabetes mellitus with other diabetic kidney complication: Secondary | ICD-10-CM | POA: Diagnosis not present

## 2018-04-28 DIAGNOSIS — D631 Anemia in chronic kidney disease: Secondary | ICD-10-CM | POA: Diagnosis not present

## 2018-04-28 DIAGNOSIS — T148XXA Other injury of unspecified body region, initial encounter: Secondary | ICD-10-CM | POA: Diagnosis not present

## 2018-04-28 DIAGNOSIS — D509 Iron deficiency anemia, unspecified: Secondary | ICD-10-CM | POA: Diagnosis not present

## 2018-04-28 DIAGNOSIS — N186 End stage renal disease: Secondary | ICD-10-CM | POA: Diagnosis not present

## 2018-04-29 ENCOUNTER — Ambulatory Visit (HOSPITAL_COMMUNITY)
Admission: RE | Admit: 2018-04-29 | Discharge: 2018-04-29 | Disposition: A | Discharge status: Home / Self Care | Payer: Medicare Other | Source: Ambulatory Visit | Attending: Nephrology | Admitting: Nephrology

## 2018-04-29 DIAGNOSIS — Z992 Dependence on renal dialysis: Secondary | ICD-10-CM | POA: Insufficient documentation

## 2018-04-29 DIAGNOSIS — I10 Essential (primary) hypertension: Secondary | ICD-10-CM

## 2018-04-29 DIAGNOSIS — I120 Hypertensive chronic kidney disease with stage 5 chronic kidney disease or end stage renal disease: Secondary | ICD-10-CM | POA: Diagnosis not present

## 2018-04-29 DIAGNOSIS — G4733 Obstructive sleep apnea (adult) (pediatric): Secondary | ICD-10-CM | POA: Insufficient documentation

## 2018-04-29 DIAGNOSIS — N185 Chronic kidney disease, stage 5: Secondary | ICD-10-CM | POA: Insufficient documentation

## 2018-04-29 DIAGNOSIS — I959 Hypotension, unspecified: Secondary | ICD-10-CM | POA: Diagnosis not present

## 2018-04-29 DIAGNOSIS — E785 Hyperlipidemia, unspecified: Secondary | ICD-10-CM | POA: Diagnosis not present

## 2018-04-29 DIAGNOSIS — E1122 Type 2 diabetes mellitus with diabetic chronic kidney disease: Secondary | ICD-10-CM | POA: Insufficient documentation

## 2018-04-29 NOTE — Progress Notes (Signed)
  Echocardiogram 2D Echocardiogram has been performed.  [name] 04/29/2018, 10:57 AM

## 2018-04-30 DIAGNOSIS — E1129 Type 2 diabetes mellitus with other diabetic kidney complication: Secondary | ICD-10-CM | POA: Diagnosis not present

## 2018-04-30 DIAGNOSIS — T148XXA Other injury of unspecified body region, initial encounter: Secondary | ICD-10-CM | POA: Diagnosis not present

## 2018-04-30 DIAGNOSIS — N2581 Secondary hyperparathyroidism of renal origin: Secondary | ICD-10-CM | POA: Diagnosis not present

## 2018-04-30 DIAGNOSIS — D631 Anemia in chronic kidney disease: Secondary | ICD-10-CM | POA: Diagnosis not present

## 2018-04-30 DIAGNOSIS — N186 End stage renal disease: Secondary | ICD-10-CM | POA: Diagnosis not present

## 2018-04-30 DIAGNOSIS — D509 Iron deficiency anemia, unspecified: Secondary | ICD-10-CM | POA: Diagnosis not present

## 2018-04-30 NOTE — Telephone Encounter (Addendum)
Patient wife called in stating that patient endocrinologist told patient that victoza would help with this and she is wanting to know what Dr.Nandigam thinks about this med. Best # [phone] or [phone]

## 2018-04-30 NOTE — Telephone Encounter (Signed)
Routed to Dr. Silverio Decamp.

## 2018-04-30 NOTE — Telephone Encounter (Signed)
No contraindication from GI standpoint.  If his blood sugars are better controlled with the new medication, it will help.  Please advise patient to follow recommendations from endocrinologist.

## 2018-05-01 ENCOUNTER — Ambulatory Visit: Payer: Medicare Other | Admitting: Gastroenterology

## 2018-05-01 NOTE — Telephone Encounter (Signed)
Called and spoke to patient's wife, relayed information and Dr. Woodward Ku recommendations.

## 2018-05-02 DIAGNOSIS — E1129 Type 2 diabetes mellitus with other diabetic kidney complication: Secondary | ICD-10-CM | POA: Diagnosis not present

## 2018-05-02 DIAGNOSIS — N186 End stage renal disease: Secondary | ICD-10-CM | POA: Diagnosis not present

## 2018-05-02 DIAGNOSIS — D509 Iron deficiency anemia, unspecified: Secondary | ICD-10-CM | POA: Diagnosis not present

## 2018-05-02 DIAGNOSIS — T148XXA Other injury of unspecified body region, initial encounter: Secondary | ICD-10-CM | POA: Diagnosis not present

## 2018-05-02 DIAGNOSIS — N2581 Secondary hyperparathyroidism of renal origin: Secondary | ICD-10-CM | POA: Diagnosis not present

## 2018-05-02 DIAGNOSIS — D631 Anemia in chronic kidney disease: Secondary | ICD-10-CM | POA: Diagnosis not present

## 2018-05-05 DIAGNOSIS — E1129 Type 2 diabetes mellitus with other diabetic kidney complication: Secondary | ICD-10-CM | POA: Diagnosis not present

## 2018-05-05 DIAGNOSIS — N2581 Secondary hyperparathyroidism of renal origin: Secondary | ICD-10-CM | POA: Diagnosis not present

## 2018-05-05 DIAGNOSIS — D631 Anemia in chronic kidney disease: Secondary | ICD-10-CM | POA: Diagnosis not present

## 2018-05-05 DIAGNOSIS — N186 End stage renal disease: Secondary | ICD-10-CM | POA: Diagnosis not present

## 2018-05-05 DIAGNOSIS — T148XXA Other injury of unspecified body region, initial encounter: Secondary | ICD-10-CM | POA: Diagnosis not present

## 2018-05-05 DIAGNOSIS — D509 Iron deficiency anemia, unspecified: Secondary | ICD-10-CM | POA: Diagnosis not present

## 2018-05-05 NOTE — Progress Notes (Signed)
[facility]  PATIENT: [name] DOB: [birth year]   REASON FOR VISIT: Follow-up for mild cognitive impairment HISTORY FROM: Patient alone at visit    [place] 8/13/2019CM [name], 77 year old male returns for follow-up with a history of mild cognitive impairment.  He continues to drive without getting lost.  He continues to be dependent with his activities of daily living.  He continues to live with his [name] independently in their own home.  He goes to the [facility] in [place] for some of his medical management.  He gets a lot of his medications from.  He remains on Aricept for memory loss.  He denies side effects to the drug.  He thinks his memory is stable.  He is not interested in any additional add on  Medication.  He sleeps well at night.  He denies any hallucinations.  He denies any sundowning.  He goes to dialysis 3 days a week.  He has had several falls since last seen one resulting in a compression fracture.  He is ambulating with a single-point cane.  He returns for reevaluation. 07/30/2017:AA Patient is stable, he sleeps ok, he was having a lot of stress in the past when he saw [place] with medical issues. He is going through dialysis, heels his memory issues are stable. Discussed his memory, MMSE 28/30,  exam is stable today. If memory loss is progressive refer him to neurocognitive testing at this point we'll monitor clinically.   UPDATE 5/ 3/ 2018CM [name], 77 year old male returns for follow-up with history of mild cognitive impairment. He is currently on Aricept 10 mg daily through the [facility] without side effects. He continues to go to dialysis 3 times a week. He continues to be active. He is going to [place] 2 weeks to see his granddaughter get married. He has had no issues with driving. His  MOCA score is stable. He returns for reevaluation  UPDATE 10/18/2017CM [name], 77 year old male returns for follow-up. He has history of mild  cognitive. MOCA is stable. He is currently on Aricept denies side effects to the medication. He gets his medications through the [facility]. He goes to dialysis 3 times a week. He continues to be fairly active. He is planning a vacation to [place] next month. He feels his memory is stable He continues to drive without difficulty. He says he has not gotten lost. He returns for reevaluation   Interval history: 01/12/16 AAPatient no-showed for multiple (3) appointments and was discharged from our practice. I spoke to him and agreed to see him again wit the understanding that if he misses another appointment he will need to find another neurology group. He has been going through a lot recently, trying to get disability and his health has declined. He says he has too many appointments. He goes to [name] and also goes to the [facility]. He can follow with [name], doesn't need to follow with [place] unless he needs [place] if he feels he has too many doctors. He takes 6107m of asa a day, not sure why, advised him this is not indicated for stroke prevention. 822mdaily. 6502maily may increase his risk of bleeding. He is on Aricept 66m56mily and we will refill this today. Last hgba1c 7.2. LDL 33. TSH wnl. B12 in July 2016 was 1068 (Hx of b12 deficiency). Memory is stable, in fact he is feeling a little better.   HPI: [name] 73 y40. male  here as a referral from [name] for memory problems. PMHx peripheral vascular disease, hypertension, hypothyroidism, hyperparathyroidism, diabetes(uncontrolled since starting dialysis), end-stage renal disease on dialysis, hyperlipidemia, b-12 deficiency on injections (B12 237 at that time). Recently he has noticed he can't catch onto things as quickly as he used to. The latter part of may her noticed it, when he was having more medical issues and he was gaining fluid and had a bout of pneumonia. His memory got really bad before he went into the hospital and was started on dialysis.  Things are better since then. He is more alert and catching things better. [name] is here and provides much information, she says he can't keep up with his cell phone and keys and he loses them. He goes into the kitchen to make a snack and he forgets to close the cabinets. He is forgetting people's names, appointments. He used to always be able to monitor medications and he doesn't remember what he is taking his meds for now. He used to be very sharp with this meds but there is a big change. He misplaces checks. He does pay the bills. No accidents with the car or at home. No delusions or hallucinations. No changes in personality. He is more anxious because of the medical problems. [name] endorses depression. He still gets out and likes to do things, but the side effects of his medical conditions are hard for him to handle. He is frustrated. No loss of consciousness, no episodes of confusion, no staring spells. No other focal neurologic deficits. He worries a lot. Endorses snoring, excessive daytime fatigue and witnessed apneic events.     REVIEW OF SYSTEMS: Full 14 system review of systems performed and notable only for those listed, all others are neg:  Constitutional: Fatigue Cardiovascular: neg Ear/Nose/Throat: neg  Skin: neg Eyes: neg Respiratory: neg Gastroitestinal: neg  Hematology/Lymphatic: Easy bruising  Endocrine: neg Musculoskeletal:neg Allergy/Immunology: neg Neurological: Mild cognitive impairment Psychiatric: neg Sleep : neg   ALLERGIES:PCN   HOME MEDICATIONS: Outpatient Medications Prior to Visit  Medication Sig Dispense Refill  . acetaminophen (TYLENOL) 500 MG tablet Take 1,000 mg by mouth 2 (two) times daily as needed (for pain).     Marland Kitchen allopurinol (ZYLOPRIM) 100 MG tablet Take 100 mg by mouth daily as needed (pain).     Marland Kitchen aspirin 325 MG tablet Take 325 mg by mouth daily.     . Blood Glucose Monitoring Suppl (FREESTYLE FREEDOM LITE) w/Device KIT Use to check blood sugar 2  times per day dx code E11.65 1 each 0  . clobetasol (TEMOVATE) 0.05 % external solution apply to affected area ON THE SCALP UP TO 2 TIMES A DAY AS NEEDED...  (REFER TO PRESCRIPTION NOTES).  0  . docusate sodium (COLACE) 100 MG capsule Take 1 capsule (100 mg total) by mouth daily. To avoid constipation from pain medication 30 capsule 0  . Fluocinolone Acetonide Body 0.01 % OIL Apply 1 ml on the skin daily  3  . FLUOCINOLONE ACETONIDE SCALP 0.01 % OIL apply to affected area ON SCALP EVERY NIGHT AT BEDTIME, WASH UPON AWAKENING  0  . fluticasone (CUTIVATE) 0.05 % cream apply to affected area ON FACE UP TO 2 TIMES A DAY AS NEEDED  0  . fluticasone (FLONASE) 50 MCG/ACT nasal spray Place 1 spray into both nostrils daily. (Patient taking differently: Place 1 spray into both nostrils daily as needed for allergies. ) 16 g 2  . glucose blood (FREESTYLE LITE) test strip Use  as instructed to check blood sugar 3 times daily. 200 each 12  . glucose blood (ONETOUCH VERIO) test strip Use to check blood sugar 2 times per day. Dx code E11.65 100 each 2  . Insulin Pen Needle 31G X 5 MM MISC Use with Victoza pen once a day 30 each 1  . ketoconazole (NIZORAL) 2 % shampoo Apply 1 application topically 2 (two) times a week. 120 mL 0  . Lancets (FREESTYLE) lancets Use as instructed to check blood sugar 2 times per day dx code E11.65 100 each 3  . levothyroxine (SYNTHROID, LEVOTHROID) 50 MCG tablet Take 1 tablet (50 mcg total) by mouth daily. 90 tablet 1  . liraglutide (VICTOZA) 18 MG/3ML SOPN Inject 0.1 mLs (0.6 mg total) into the skin daily. 2 pen 1  . loratadine (CLARITIN) 10 MG tablet Take 10 mg by mouth daily as needed for allergies.     . methocarbamol (ROBAXIN) 500 MG tablet Take 1 tablet (500 mg total) by mouth at bedtime as needed for muscle spasms. 20 tablet 0  . mupirocin ointment (BACTROBAN) 2 % Use as directed to affected areas (Patient taking differently: Apply 1 application topically 3 (three) times daily as  needed (to affected areas). ) 22 g prn  . Naftifine HCl (NAFTIN) 2 % CREA Apply to feet daily for fungal infection (Patient taking differently: Apply topically daily as needed (for fungal infection). To feet) 60 g prn  . nystatin-triamcinolone (MYCOLOG II) cream Apply 1 application topically 2 (two) times daily. 30 g 0  . omeprazole (PRILOSEC) 40 MG capsule Take 1 capsule (40 mg total) by mouth daily. 30 capsule 0  . Probiotic Product (PROBIOTIC PO) Take 1 tablet by mouth daily.    Marland Kitchen RENVELA 800 MG tablet Take 800-1,600 mg by mouth See admin instructions. Take 1 tablet with breakfast and at lunch then take 2 tablets at dinner and 800 mg with each snack  0  . simvastatin (ZOCOR) 20 MG tablet Take 20 mg by mouth at bedtime.     . TRADJENTA 5 MG TABS tablet TAKE 1 TABLET(5 MG) BY MOUTH DAILY 30 tablet 0  . triamcinolone ointment (KENALOG) 0.1 % Apply 1 application topically 2 (two) times daily. Use between thighs    . donepezil (ARICEPT) 10 MG tablet Take 1 tablet (10 mg total) by mouth at bedtime. 30 tablet 12   Facility-Administered Medications Prior to Visit  Medication Dose Route Frequency Provider Last Rate Last Dose  . 0.9 %  sodium chloride infusion  500 mL Intravenous Once Nandigam, Kavitha V, MD      . dextrose 5 % solution   Intravenous Once Nandigam, Venia Minks, MD        PAST MEDICAL HISTORY: Past Medical History:  Diagnosis Date  . Allergy   . Anemia   . Arthritis   . Cataract    bil cateracts removed  . Coronary artery disease   . Diabetes mellitus    Type 2  . Diverticulitis   . ED (erectile dysfunction)   . 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 ESRD (end stage renal disease) on dialysis ([place]) 03/2015  . GERD (gastroesophageal reflux disease)    pepto   . Gout   . Hyperlipidemia   . Hypertension   . Hypothyroidism   . Pneumonia   . PVD (peripheral vascular disease) ([place])    has plastic aorta  . Renal insufficiency   . Seasonal allergies   . Shortness of breath dyspnea    .  Sleep apnea    does not wear c-pap  . Thyroid disease     PAST SURGICAL HISTORY: Past Surgical History:  Procedure Laterality Date  . aortobifemoral bypass    . AV FISTULA PLACEMENT Left 12/01/2013   Procedure: ARTERIOVENOUS (AV) FISTULA CREATION- LEFT BRACHIOCEPHALIC;  Surgeon: Angelia Mould, MD;  Location: [facility];  Service: Vascular;  Laterality: Left;  . [place] TRANSPOSITION Right 07/27/2014   Procedure: BASCILIC VEIN TRANSPOSITION;  Surgeon: Angelia Mould, MD;  Location: [facility];  Service: Vascular;  Laterality: Right;  . BREAST SURGERY     left - granulomatous mastitis  . COLONOSCOPY    . 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 EYE SURGERY Bilateral    cataracts  . HEMODIALYSIS INPATIENT  01/17/2018      . REVISON OF ARTERIOVENOUS FISTULA Left 02/09/2014   Procedure: REVISON OF LEFT ARTERIOVENOUS FISTULA - RESECTION OF RENDUNDANT VEIN;  Surgeon: Angelia Mould, MD;  Location: [facility];  Service: Vascular;  Laterality: Left;  . SBO with lysis adhesions    . SHUNTOGRAM Left 04/19/2014   Procedure: FISTULOGRAM;  Surgeon: Angelia Mould, MD;  Location: [facility] CATH LAB;  Service: Cardiovascular;  Laterality: Left;  . UNILATERAL UPPER EXTREMEITY ANGIOGRAM N/A 07/12/2014   Procedure: UNILATERAL UPPER Anselmo Rod;  Surgeon: Angelia Mould, MD;  Location: [facility] CATH LAB;  Service: Cardiovascular;  Laterality: N/A;    FAMILY HISTORY: Family History  Problem Relation Age of Onset  . Aneurysm Mother   . Heart disease Father   . Stroke Father   . Hypertension Father   . Diabetes Father   . Dementia Neg Hx   . Colon cancer Neg Hx   . Esophageal cancer Neg Hx   . Pancreatic cancer Neg Hx   . Prostate cancer Neg Hx   . Rectal cancer Neg Hx   . Stomach cancer Neg Hx     SOCIAL HISTORY: Social History   Socioeconomic History  . Marital status: Married    Spouse name: [name] Mood  . Number of children: 1  . Years of education: 62    . Highest education level: Not on file  Occupational History  . Occupation: Retired  Scientific laboratory technician  . Financial resource strain: Not on file  . Food insecurity:    Worry: Not on file    Inability: Not on file  . Transportation needs:    Medical: No    Non-medical: No  Tobacco Use  . Smoking status: Former Smoker    Last attempt to quit: 09/24/1994    Years since quitting: 23.6  . Smokeless tobacco: Never Used  Substance and Sexual Activity  . Alcohol use: No    Alcohol/week: 0.0 standard drinks    Comment: Quit Oct. 1977 ("somewhat heavy")  . Drug use: No  . Sexual activity: Not on file  Lifestyle  . Physical activity:    Days per week: 3 days    Minutes per session: 10 min  . Stress: Not at all  Relationships  . Social connections:    Talks on phone: Not on file    Gets together: Not on file    Attends religious service: Not on file    Active member of club or organization: Not on file    Attends meetings of clubs or organizations: Not on file    Relationship status: Not on file  . Intimate partner violence:    Fear of current or ex  partner: Not on file    Emotionally abused: Not on file    Physically abused: Not on file    Forced sexual activity: Not on file  Other Topics Concern  . Not on file  Social History Narrative   Lives at home with [name].   Caffeine use: Drinks no soda or tea.    Drinks 1 cup coffee/week   Right handed     PHYSICAL EXAM  Vitals:   05/06/18 1023  Pulse: 66  Weight: 155 lb (70.3 kg)  Height: _0  (1.803 m)   Body mass index is 21.62 kg/m.  Generalized: Well developed, in no acute distress ,Well-groomed Head: normocephalic and atraumatic,. Oropharynx benign  Neck: Supple,  Musculoskeletal: No deformity   Neurological examination   Mentation: Alert AFT 10. Clock drawing 3/4 MMSE - Mini Mental State Exam 05/06/2018 07/30/2017  Orientation to time 4 5  Orientation to Place 4 5  Registration 3 3  Attention/ Calculation 4 5   Recall 1 2  Language- name 2 objects 2 2  Language- repeat 1 1  Language- follow 3 step command 2 3  Language- read & follow direction 1 1  Write a sentence 1 1  Copy design 1 0  Total score 24 28   Follows all commands speech and language fluent.    Cranial nerve II-XII: .Pupils were equal round reactive to light extraocular movements were full, visual field were full on confrontational test. Facial sensation and strength were normal. hearing was intact to finger rubbing bilaterally. Uvula tongue midline. head turning and shoulder shrug were normal and symmetric.Tongue protrusion into cheek strength was normal. Motor: normal bulk and tone, full strength in the BUE, BLE,  Sensory: normal and symmetric to light touch,  Coordination: finger-nose-finger, heel-to-shin bilaterally, no dysmetria Reflexes: Symmetric upper and lower, plantar responses were flexor bilaterally. Gait and Station: Rising up from seated position without assistance, normal stance,  moderate stride, good arm swing, smooth turning, able to perform tiptoe, and heel walking without difficulty. Tandem gait is mildly unsteady.  Ambulates with single-point cane  DIAGNOSTIC DATA (LABS, IMAGING, TESTING) - I reviewed patient records, labs, notes, testing and imaging myself where available.  Lab Results  Component Value Date   WBC 7.8 01/24/2018   HGB 8.4 (L) 01/24/2018   HCT 25.7 (L) 01/24/2018   MCV 104.5 (H) 01/24/2018   PLT 254 01/24/2018      Component Value Date/Time   NA 140 01/23/2018 1240   K 4.1 01/23/2018 1240   CL 96 (L) 01/23/2018 1240   CO2 31 01/23/2018 1240   GLUCOSE 123 (H) 04/17/2018 0800   BUN 12 01/23/2018 1240   CREATININE 4.48 (H) 01/23/2018 1240   CREATININE 5.26 (H) 11/07/2017 1050   CALCIUM 7.9 (L) 04/17/2018 0800   PROT 5.6 (L) 01/23/2018 1240   ALBUMIN 2.5 (L) 01/23/2018 1240   AST 49 (H) 01/23/2018 1240   ALT 14 (L) 01/23/2018 1240   ALKPHOS 129 (H) 01/23/2018 1240   BILITOT 0.8  01/23/2018 1240   GFRNONAA 12 (L) 01/23/2018 1240   GFRNONAA 10 (L) 11/07/2017 1050   GFRAA 13 (L) 01/23/2018 1240   GFRAA 11 (L) 11/07/2017 1050   Lab Results  Component Value Date   CHOL 79 09/05/2017   HDL 38.00 (L) 09/05/2017   LDLCALC 21 09/05/2017   TRIG 99.0 09/05/2017   CHOLHDL 2 09/05/2017   Lab Results  Component Value Date   HGBA1C 6.3 04/17/2018   Lab Results  Component  Value Date   VITAMINB12 963 09/26/2017   Lab Results  Component Value Date   TSH 0.90 02/20/2018      ASSESSMENT AND PLAN DAQUAWN SEELMAN is a very nice 77 y.o. male here as a referral from [name] for memory problems. PMHx peripheral vascular disease, hypertension, hypothyroidism, hyperparathyroidism, diabetes, end-stage renal disease on dialysis, hyperlipidemia, b-12 deficiency. Neurologic exam is nonfocal. MRI of the brain showed nonspecific white matter changes that were mild for age. Discussion with patient that his memory complaints are likely multifactorial. There is probably some mild cognitive impairment that's normal for age but his medical problems are likely contributing significantly to his complaints.  He had a  thorough dementia workup.      PLAN: Continue Aricept at current dose, meds through the [facility] MMSE 24/30.  Follow-up with [place] in 6 months for repeat testing Continue to stay active and exercises as tolerated, healthy diet Patient was advised to aggressively manage any modifiable risk factors for cognitive decline and be compliant with all medical treatments Strategies that can compensate for cognitive deficits are a structured routine, familiar environment multitasking should be avoided. Using a daily planner for appointments, keeping track of dates. At risk for falls use  cane at all times F/U in 6 months I spent 25 min in total face to face time with the patient more than 50% of which was spent counseling and coordination of care, reviewing test results reviewing medications  and discussing and reviewing the diagnosis of mild cognitive impairment and strategies that can compensate, see above Dennie Bible, [facility], [facility], APRN  [facility] [address], [place], [place] [address] [phone]

## 2018-05-06 ENCOUNTER — Encounter: Payer: Self-pay | Admitting: Nurse Practitioner

## 2018-05-06 ENCOUNTER — Ambulatory Visit (INDEPENDENT_AMBULATORY_CARE_PROVIDER_SITE_OTHER): Payer: Medicare Other | Admitting: Nurse Practitioner

## 2018-05-06 VITALS — HR 66 | Ht 71.0 in | Wt 155.0 lb

## 2018-05-06 DIAGNOSIS — G3184 Mild cognitive impairment, so stated: Secondary | ICD-10-CM

## 2018-05-06 MED ORDER — DONEPEZIL HCL 10 MG PO TABS: 10.0000 mg | ORAL_TABLET | Freq: Every day | ORAL | 11 refills | Status: DC

## 2018-05-06 NOTE — Patient Instructions (Signed)
Continue Aricept at current dose, meds through the [facility] MMSE 24/30.  Follow-up with [place] in 6 months for repeat testing Continue to stay active and exercises as tolerated, healthy diet Patient was advised to aggressively manage any modifiable risk factors for cognitive decline and be compliant with all medical treatments Strategies that can compensate for cognitive deficits are a structured routine, familiar environment multitasking should be avoided. Using a daily planner for appointments, keeping track of dates. F/U in 6 months

## 2018-05-07 DIAGNOSIS — N186 End stage renal disease: Secondary | ICD-10-CM | POA: Diagnosis not present

## 2018-05-07 DIAGNOSIS — N2581 Secondary hyperparathyroidism of renal origin: Secondary | ICD-10-CM | POA: Diagnosis not present

## 2018-05-07 DIAGNOSIS — D631 Anemia in chronic kidney disease: Secondary | ICD-10-CM | POA: Diagnosis not present

## 2018-05-07 DIAGNOSIS — T148XXA Other injury of unspecified body region, initial encounter: Secondary | ICD-10-CM | POA: Diagnosis not present

## 2018-05-07 DIAGNOSIS — E1129 Type 2 diabetes mellitus with other diabetic kidney complication: Secondary | ICD-10-CM | POA: Diagnosis not present

## 2018-05-07 DIAGNOSIS — D509 Iron deficiency anemia, unspecified: Secondary | ICD-10-CM | POA: Diagnosis not present

## 2018-05-07 NOTE — Progress Notes (Signed)
made any corrections needed to history, physical, neuro exam,assessment and agree with plan as stated above.    Sarina Ill, MD

## 2018-05-09 ENCOUNTER — Other Ambulatory Visit: Payer: Self-pay | Admitting: Nephrology

## 2018-05-09 ENCOUNTER — Other Ambulatory Visit (HOSPITAL_COMMUNITY): Payer: Self-pay | Admitting: Nephrology

## 2018-05-09 DIAGNOSIS — N186 End stage renal disease: Secondary | ICD-10-CM | POA: Diagnosis not present

## 2018-05-09 DIAGNOSIS — T148XXA Other injury of unspecified body region, initial encounter: Secondary | ICD-10-CM | POA: Diagnosis not present

## 2018-05-09 DIAGNOSIS — E1129 Type 2 diabetes mellitus with other diabetic kidney complication: Secondary | ICD-10-CM | POA: Diagnosis not present

## 2018-05-09 DIAGNOSIS — D631 Anemia in chronic kidney disease: Secondary | ICD-10-CM | POA: Diagnosis not present

## 2018-05-09 DIAGNOSIS — N2581 Secondary hyperparathyroidism of renal origin: Secondary | ICD-10-CM | POA: Diagnosis not present

## 2018-05-09 DIAGNOSIS — D509 Iron deficiency anemia, unspecified: Secondary | ICD-10-CM | POA: Diagnosis not present

## 2018-05-12 DIAGNOSIS — N2581 Secondary hyperparathyroidism of renal origin: Secondary | ICD-10-CM | POA: Diagnosis not present

## 2018-05-12 DIAGNOSIS — T148XXA Other injury of unspecified body region, initial encounter: Secondary | ICD-10-CM | POA: Diagnosis not present

## 2018-05-12 DIAGNOSIS — D631 Anemia in chronic kidney disease: Secondary | ICD-10-CM | POA: Diagnosis not present

## 2018-05-12 DIAGNOSIS — E1129 Type 2 diabetes mellitus with other diabetic kidney complication: Secondary | ICD-10-CM | POA: Diagnosis not present

## 2018-05-12 DIAGNOSIS — N186 End stage renal disease: Secondary | ICD-10-CM | POA: Diagnosis not present

## 2018-05-12 DIAGNOSIS — D509 Iron deficiency anemia, unspecified: Secondary | ICD-10-CM | POA: Diagnosis not present

## 2018-05-14 DIAGNOSIS — N2581 Secondary hyperparathyroidism of renal origin: Secondary | ICD-10-CM | POA: Diagnosis not present

## 2018-05-14 DIAGNOSIS — T148XXA Other injury of unspecified body region, initial encounter: Secondary | ICD-10-CM | POA: Diagnosis not present

## 2018-05-14 DIAGNOSIS — D631 Anemia in chronic kidney disease: Secondary | ICD-10-CM | POA: Diagnosis not present

## 2018-05-14 DIAGNOSIS — D509 Iron deficiency anemia, unspecified: Secondary | ICD-10-CM | POA: Diagnosis not present

## 2018-05-14 DIAGNOSIS — N186 End stage renal disease: Secondary | ICD-10-CM | POA: Diagnosis not present

## 2018-05-14 DIAGNOSIS — E1129 Type 2 diabetes mellitus with other diabetic kidney complication: Secondary | ICD-10-CM | POA: Diagnosis not present

## 2018-05-15 ENCOUNTER — Ambulatory Visit: Payer: Medicare Other | Admitting: Gastroenterology

## 2018-05-15 ENCOUNTER — Other Ambulatory Visit (HOSPITAL_COMMUNITY): Payer: Medicare Other

## 2018-05-16 ENCOUNTER — Encounter (HOSPITAL_COMMUNITY)
Admission: RE | Admit: 2018-05-16 | Discharge: 2018-05-16 | Disposition: A | Discharge status: Home / Self Care | Payer: Medicare Other | Source: Ambulatory Visit | Attending: Nephrology | Admitting: Nephrology

## 2018-05-16 DIAGNOSIS — N186 End stage renal disease: Secondary | ICD-10-CM | POA: Diagnosis not present

## 2018-05-16 MED ORDER — TECHNETIUM TC 99M SESTAMIBI - CARDIOLITE
25.6000 | Freq: Once | INTRAVENOUS | Status: AC | PRN
  Administered 2018-05-16: 25.6 via INTRAVENOUS

## 2018-05-17 DIAGNOSIS — E1129 Type 2 diabetes mellitus with other diabetic kidney complication: Secondary | ICD-10-CM | POA: Diagnosis not present

## 2018-05-17 DIAGNOSIS — N186 End stage renal disease: Secondary | ICD-10-CM | POA: Diagnosis not present

## 2018-05-17 DIAGNOSIS — T148XXA Other injury of unspecified body region, initial encounter: Secondary | ICD-10-CM | POA: Diagnosis not present

## 2018-05-17 DIAGNOSIS — D631 Anemia in chronic kidney disease: Secondary | ICD-10-CM | POA: Diagnosis not present

## 2018-05-17 DIAGNOSIS — D509 Iron deficiency anemia, unspecified: Secondary | ICD-10-CM | POA: Diagnosis not present

## 2018-05-17 DIAGNOSIS — N2581 Secondary hyperparathyroidism of renal origin: Secondary | ICD-10-CM | POA: Diagnosis not present

## 2018-05-19 DIAGNOSIS — E1129 Type 2 diabetes mellitus with other diabetic kidney complication: Secondary | ICD-10-CM | POA: Diagnosis not present

## 2018-05-19 DIAGNOSIS — D631 Anemia in chronic kidney disease: Secondary | ICD-10-CM | POA: Diagnosis not present

## 2018-05-19 DIAGNOSIS — T148XXA Other injury of unspecified body region, initial encounter: Secondary | ICD-10-CM | POA: Diagnosis not present

## 2018-05-19 DIAGNOSIS — N2581 Secondary hyperparathyroidism of renal origin: Secondary | ICD-10-CM | POA: Diagnosis not present

## 2018-05-19 DIAGNOSIS — N186 End stage renal disease: Secondary | ICD-10-CM | POA: Diagnosis not present

## 2018-05-19 DIAGNOSIS — D509 Iron deficiency anemia, unspecified: Secondary | ICD-10-CM | POA: Diagnosis not present

## 2018-05-20 NOTE — Addendum Note (Signed)
Encounter addended by: Valinda Hoar on: 05/20/2018 7:42 AM  Actions taken: Imaging Exam ended

## 2018-05-21 DIAGNOSIS — D631 Anemia in chronic kidney disease: Secondary | ICD-10-CM | POA: Diagnosis not present

## 2018-05-21 DIAGNOSIS — D509 Iron deficiency anemia, unspecified: Secondary | ICD-10-CM | POA: Diagnosis not present

## 2018-05-21 DIAGNOSIS — N186 End stage renal disease: Secondary | ICD-10-CM | POA: Diagnosis not present

## 2018-05-21 DIAGNOSIS — N2581 Secondary hyperparathyroidism of renal origin: Secondary | ICD-10-CM | POA: Diagnosis not present

## 2018-05-21 DIAGNOSIS — E1129 Type 2 diabetes mellitus with other diabetic kidney complication: Secondary | ICD-10-CM | POA: Diagnosis not present

## 2018-05-21 DIAGNOSIS — T148XXA Other injury of unspecified body region, initial encounter: Secondary | ICD-10-CM | POA: Diagnosis not present

## 2018-05-23 ENCOUNTER — Telehealth: Payer: Self-pay | Admitting: Endocrinology

## 2018-05-23 DIAGNOSIS — D509 Iron deficiency anemia, unspecified: Secondary | ICD-10-CM | POA: Diagnosis not present

## 2018-05-23 DIAGNOSIS — D631 Anemia in chronic kidney disease: Secondary | ICD-10-CM | POA: Diagnosis not present

## 2018-05-23 DIAGNOSIS — T148XXA Other injury of unspecified body region, initial encounter: Secondary | ICD-10-CM | POA: Diagnosis not present

## 2018-05-23 DIAGNOSIS — N2581 Secondary hyperparathyroidism of renal origin: Secondary | ICD-10-CM | POA: Diagnosis not present

## 2018-05-23 DIAGNOSIS — E1129 Type 2 diabetes mellitus with other diabetic kidney complication: Secondary | ICD-10-CM | POA: Diagnosis not present

## 2018-05-23 DIAGNOSIS — N186 End stage renal disease: Secondary | ICD-10-CM | POA: Diagnosis not present

## 2018-05-23 NOTE — Telephone Encounter (Signed)
Patient needs RX for Victoza sent to the [facility].

## 2018-05-25 DIAGNOSIS — E1122 Type 2 diabetes mellitus with diabetic chronic kidney disease: Secondary | ICD-10-CM | POA: Diagnosis not present

## 2018-05-25 DIAGNOSIS — Z992 Dependence on renal dialysis: Secondary | ICD-10-CM | POA: Diagnosis not present

## 2018-05-25 DIAGNOSIS — N186 End stage renal disease: Secondary | ICD-10-CM | POA: Diagnosis not present

## 2018-05-26 DIAGNOSIS — E1129 Type 2 diabetes mellitus with other diabetic kidney complication: Secondary | ICD-10-CM | POA: Diagnosis not present

## 2018-05-26 DIAGNOSIS — N186 End stage renal disease: Secondary | ICD-10-CM | POA: Diagnosis not present

## 2018-05-26 DIAGNOSIS — N2581 Secondary hyperparathyroidism of renal origin: Secondary | ICD-10-CM | POA: Diagnosis not present

## 2018-05-26 DIAGNOSIS — D509 Iron deficiency anemia, unspecified: Secondary | ICD-10-CM | POA: Diagnosis not present

## 2018-05-26 DIAGNOSIS — D631 Anemia in chronic kidney disease: Secondary | ICD-10-CM | POA: Diagnosis not present

## 2018-05-27 ENCOUNTER — Other Ambulatory Visit: Payer: Self-pay

## 2018-05-27 ENCOUNTER — Other Ambulatory Visit: Payer: Self-pay | Admitting: Emergency Medicine

## 2018-05-27 MED ORDER — LIRAGLUTIDE 18 MG/3ML ~~LOC~~ SOPN: 0.6000 mg | PEN_INJECTOR | Freq: Every day | SUBCUTANEOUS | 1 refills | Status: DC

## 2018-05-27 NOTE — Telephone Encounter (Signed)
Called and spoke to patients wife informing her that I will print the prescription and have it upfront for pick up. Understanding verbalized patient would also like the prescription to be faxed to the [facility] clinic. Patient will provide fax number upon pickup.Nothinf further needed at this time.

## 2018-05-27 NOTE — Telephone Encounter (Signed)
Patient came into office to pick up prescription and a copy was also faxed to the [facility] in [place].

## 2018-05-28 DIAGNOSIS — N186 End stage renal disease: Secondary | ICD-10-CM | POA: Diagnosis not present

## 2018-05-28 DIAGNOSIS — N2581 Secondary hyperparathyroidism of renal origin: Secondary | ICD-10-CM | POA: Diagnosis not present

## 2018-05-28 DIAGNOSIS — E1129 Type 2 diabetes mellitus with other diabetic kidney complication: Secondary | ICD-10-CM | POA: Diagnosis not present

## 2018-05-28 DIAGNOSIS — D509 Iron deficiency anemia, unspecified: Secondary | ICD-10-CM | POA: Diagnosis not present

## 2018-05-28 DIAGNOSIS — D631 Anemia in chronic kidney disease: Secondary | ICD-10-CM | POA: Diagnosis not present

## 2018-05-29 DIAGNOSIS — L298 Other pruritus: Secondary | ICD-10-CM | POA: Diagnosis not present

## 2018-05-29 DIAGNOSIS — L218 Other seborrheic dermatitis: Secondary | ICD-10-CM | POA: Diagnosis not present

## 2018-05-29 DIAGNOSIS — D692 Other nonthrombocytopenic purpura: Secondary | ICD-10-CM | POA: Diagnosis not present

## 2018-05-30 DIAGNOSIS — N186 End stage renal disease: Secondary | ICD-10-CM | POA: Diagnosis not present

## 2018-05-30 DIAGNOSIS — D509 Iron deficiency anemia, unspecified: Secondary | ICD-10-CM | POA: Diagnosis not present

## 2018-05-30 DIAGNOSIS — N2581 Secondary hyperparathyroidism of renal origin: Secondary | ICD-10-CM | POA: Diagnosis not present

## 2018-05-30 DIAGNOSIS — E1129 Type 2 diabetes mellitus with other diabetic kidney complication: Secondary | ICD-10-CM | POA: Diagnosis not present

## 2018-05-30 DIAGNOSIS — D631 Anemia in chronic kidney disease: Secondary | ICD-10-CM | POA: Diagnosis not present

## 2018-06-01 ENCOUNTER — Emergency Department (HOSPITAL_COMMUNITY): Payer: Medicare Other

## 2018-06-01 ENCOUNTER — Inpatient Hospital Stay (HOSPITAL_COMMUNITY): Payer: Medicare Other

## 2018-06-01 ENCOUNTER — Inpatient Hospital Stay (HOSPITAL_COMMUNITY)
Admission: EM | Admit: 2018-06-01 | Discharge: 2018-06-03 | DRG: 377 | Disposition: A | Discharge status: Home / Self Care | Payer: Medicare Other | Attending: Family Medicine | Admitting: Family Medicine

## 2018-06-01 ENCOUNTER — Encounter (HOSPITAL_COMMUNITY): Payer: Self-pay

## 2018-06-01 ENCOUNTER — Other Ambulatory Visit: Payer: Self-pay

## 2018-06-01 DIAGNOSIS — D62 Acute posthemorrhagic anemia: Secondary | ICD-10-CM | POA: Diagnosis present

## 2018-06-01 DIAGNOSIS — K219 Gastro-esophageal reflux disease without esophagitis: Secondary | ICD-10-CM | POA: Diagnosis present

## 2018-06-01 DIAGNOSIS — K31819 Angiodysplasia of stomach and duodenum without bleeding: Secondary | ICD-10-CM | POA: Diagnosis not present

## 2018-06-01 DIAGNOSIS — N186 End stage renal disease: Secondary | ICD-10-CM | POA: Diagnosis not present

## 2018-06-01 DIAGNOSIS — N2581 Secondary hyperparathyroidism of renal origin: Secondary | ICD-10-CM | POA: Diagnosis present

## 2018-06-01 DIAGNOSIS — R4189 Other symptoms and signs involving cognitive functions and awareness: Secondary | ICD-10-CM | POA: Diagnosis present

## 2018-06-01 DIAGNOSIS — E1122 Type 2 diabetes mellitus with diabetic chronic kidney disease: Secondary | ICD-10-CM | POA: Diagnosis present

## 2018-06-01 DIAGNOSIS — K31811 Angiodysplasia of stomach and duodenum with bleeding: Principal | ICD-10-CM | POA: Diagnosis present

## 2018-06-01 DIAGNOSIS — K922 Gastrointestinal hemorrhage, unspecified: Secondary | ICD-10-CM | POA: Diagnosis present

## 2018-06-01 DIAGNOSIS — Z7982 Long term (current) use of aspirin: Secondary | ICD-10-CM

## 2018-06-01 DIAGNOSIS — D509 Iron deficiency anemia, unspecified: Secondary | ICD-10-CM | POA: Diagnosis not present

## 2018-06-01 DIAGNOSIS — F0390 Unspecified dementia without behavioral disturbance: Secondary | ICD-10-CM | POA: Diagnosis not present

## 2018-06-01 DIAGNOSIS — K5730 Diverticulosis of large intestine without perforation or abscess without bleeding: Secondary | ICD-10-CM | POA: Diagnosis not present

## 2018-06-01 DIAGNOSIS — E785 Hyperlipidemia, unspecified: Secondary | ICD-10-CM | POA: Diagnosis present

## 2018-06-01 DIAGNOSIS — G4733 Obstructive sleep apnea (adult) (pediatric): Secondary | ICD-10-CM | POA: Diagnosis present

## 2018-06-01 DIAGNOSIS — D6489 Other specified anemias: Secondary | ICD-10-CM | POA: Diagnosis not present

## 2018-06-01 DIAGNOSIS — D539 Nutritional anemia, unspecified: Secondary | ICD-10-CM | POA: Diagnosis present

## 2018-06-01 DIAGNOSIS — R195 Other fecal abnormalities: Secondary | ICD-10-CM | POA: Diagnosis not present

## 2018-06-01 DIAGNOSIS — M109 Gout, unspecified: Secondary | ICD-10-CM | POA: Diagnosis present

## 2018-06-01 DIAGNOSIS — Z992 Dependence on renal dialysis: Secondary | ICD-10-CM

## 2018-06-01 DIAGNOSIS — R0789 Other chest pain: Secondary | ICD-10-CM | POA: Diagnosis not present

## 2018-06-01 DIAGNOSIS — N281 Cyst of kidney, acquired: Secondary | ICD-10-CM | POA: Diagnosis not present

## 2018-06-01 DIAGNOSIS — E039 Hypothyroidism, unspecified: Secondary | ICD-10-CM | POA: Diagnosis not present

## 2018-06-01 DIAGNOSIS — I120 Hypertensive chronic kidney disease with stage 5 chronic kidney disease or end stage renal disease: Secondary | ICD-10-CM | POA: Diagnosis not present

## 2018-06-01 DIAGNOSIS — R1032 Left lower quadrant pain: Secondary | ICD-10-CM | POA: Diagnosis not present

## 2018-06-01 DIAGNOSIS — M4856XA Collapsed vertebra, not elsewhere classified, lumbar region, initial encounter for fracture: Secondary | ICD-10-CM | POA: Diagnosis present

## 2018-06-01 DIAGNOSIS — IMO0001 Reserved for inherently not codable concepts without codable children: Secondary | ICD-10-CM

## 2018-06-01 DIAGNOSIS — I10 Essential (primary) hypertension: Secondary | ICD-10-CM | POA: Diagnosis not present

## 2018-06-01 DIAGNOSIS — M810 Age-related osteoporosis without current pathological fracture: Secondary | ICD-10-CM | POA: Diagnosis present

## 2018-06-01 DIAGNOSIS — D631 Anemia in chronic kidney disease: Secondary | ICD-10-CM | POA: Diagnosis not present

## 2018-06-01 DIAGNOSIS — R0781 Pleurodynia: Secondary | ICD-10-CM

## 2018-06-01 DIAGNOSIS — Z87891 Personal history of nicotine dependence: Secondary | ICD-10-CM

## 2018-06-01 DIAGNOSIS — Q2730 Arteriovenous malformation, site unspecified: Secondary | ICD-10-CM | POA: Diagnosis not present

## 2018-06-01 DIAGNOSIS — Z9012 Acquired absence of left breast and nipple: Secondary | ICD-10-CM

## 2018-06-01 DIAGNOSIS — E1151 Type 2 diabetes mellitus with diabetic peripheral angiopathy without gangrene: Secondary | ICD-10-CM | POA: Diagnosis present

## 2018-06-01 DIAGNOSIS — Z880 Allergy status to penicillin: Secondary | ICD-10-CM

## 2018-06-01 DIAGNOSIS — Z794 Long term (current) use of insulin: Secondary | ICD-10-CM

## 2018-06-01 DIAGNOSIS — I2510 Atherosclerotic heart disease of native coronary artery without angina pectoris: Secondary | ICD-10-CM | POA: Diagnosis present

## 2018-06-01 DIAGNOSIS — E119 Type 2 diabetes mellitus without complications: Secondary | ICD-10-CM | POA: Diagnosis not present

## 2018-06-01 DIAGNOSIS — Z79899 Other long term (current) drug therapy: Secondary | ICD-10-CM

## 2018-06-01 DIAGNOSIS — R52 Pain, unspecified: Secondary | ICD-10-CM

## 2018-06-01 DIAGNOSIS — K2970 Gastritis, unspecified, without bleeding: Secondary | ICD-10-CM | POA: Diagnosis not present

## 2018-06-01 DIAGNOSIS — R531 Weakness: Secondary | ICD-10-CM | POA: Diagnosis not present

## 2018-06-01 DIAGNOSIS — K921 Melena: Secondary | ICD-10-CM | POA: Diagnosis not present

## 2018-06-01 DIAGNOSIS — K222 Esophageal obstruction: Secondary | ICD-10-CM | POA: Diagnosis not present

## 2018-06-01 LAB — COMPREHENSIVE METABOLIC PANEL
ALT: 12 U/L (ref 0–44)
AST: 26 U/L (ref 15–41)
Albumin: 2.6 g/dL — ABNORMAL LOW (ref 3.5–5.0)
Alkaline Phosphatase: 75 U/L (ref 38–126)
Anion gap: 16 — ABNORMAL HIGH (ref 5–15)
BUN: 31 mg/dL — ABNORMAL HIGH (ref 8–23)
CO2: 25 mmol/L (ref 22–32)
Calcium: 9.1 mg/dL (ref 8.9–10.3)
Chloride: 101 mmol/L (ref 98–111)
Creatinine, Ser: 5.62 mg/dL — ABNORMAL HIGH (ref 0.61–1.24)
GFR calc Af Amer: 10 mL/min — ABNORMAL LOW (ref >60)
GFR calc non Af Amer: 9 mL/min — ABNORMAL LOW (ref >60)
Glucose, Bld: 102 mg/dL — ABNORMAL HIGH (ref 70–99)
Potassium: 4.6 mmol/L (ref 3.5–5.1)
Sodium: 142 mmol/L (ref 135–145)
Total Bilirubin: 0.6 mg/dL (ref 0.3–1.2)
Total Protein: 5 g/dL — ABNORMAL LOW (ref 6.5–8.1)

## 2018-06-01 LAB — CBC WITH DIFFERENTIAL/PLATELET
Basophils Absolute: 0.1 10*3/uL (ref 0.0–0.1)
Basophils Relative: 1 %
Eosinophils Absolute: 0.3 10*3/uL (ref 0.0–0.7)
Eosinophils Relative: 3 %
HCT: 18.7 % — ABNORMAL LOW (ref 39.0–52.0)
Hemoglobin: 5.8 g/dL — CL (ref 13.0–17.0)
Lymphocytes Relative: 20 %
Lymphs Abs: 2 10*3/uL (ref 0.7–4.0)
MCH: 33.9 pg (ref 26.0–34.0)
MCHC: 31 g/dL (ref 30.0–36.0)
MCV: 109.4 fL — ABNORMAL HIGH (ref 78.0–100.0)
Monocytes Absolute: 0.7 10*3/uL (ref 0.1–1.0)
Monocytes Relative: 7 %
Neutro Abs: 6.9 10*3/uL (ref 1.7–7.7)
Neutrophils Relative %: 69 %
Platelets: 223 10*3/uL (ref 150–400)
RBC: 1.71 MIL/uL — ABNORMAL LOW (ref 4.22–5.81)
RDW: 21.3 % — ABNORMAL HIGH (ref 11.5–15.5)
WBC: 10 10*3/uL (ref 4.0–10.5)

## 2018-06-01 LAB — HEMOGLOBIN A1C
Hgb A1c MFr Bld: 5.2 % (ref 4.8–5.6)
Mean Plasma Glucose: 102.54 mg/dL

## 2018-06-01 LAB — POC OCCULT BLOOD, ED: Fecal Occult Bld: POSITIVE — AB

## 2018-06-01 LAB — GLUCOSE, CAPILLARY
Glucose-Capillary: 58 mg/dL — ABNORMAL LOW (ref 70–99)
Glucose-Capillary: 128 mg/dL — ABNORMAL HIGH (ref 70–99)
Glucose-Capillary: 78 mg/dL (ref 70–99)

## 2018-06-01 LAB — PREPARE RBC (CROSSMATCH)

## 2018-06-01 LAB — LIPASE, BLOOD: Lipase: 70 U/L — ABNORMAL HIGH (ref 11–51)

## 2018-06-01 MED ORDER — NAFTIFINE HCL 2 % EX CREA: 1.0000 "application " | TOPICAL_CREAM | Freq: Every day | CUTANEOUS | Status: DC | PRN

## 2018-06-01 MED ORDER — CLOBETASOL PROPIONATE 0.05 % EX SOLN: 1.0000 "application " | Freq: Two times a day (BID) | CUTANEOUS | Status: DC | PRN

## 2018-06-01 MED ORDER — MUPIROCIN 2 % EX OINT: 1.0000 "application " | TOPICAL_OINTMENT | Freq: Three times a day (TID) | CUTANEOUS | Status: DC | PRN

## 2018-06-01 MED ORDER — FLUOCINOLONE ACETONIDE SCALP 0.01 % EX OIL: 1.0000 "application " | TOPICAL_OIL | Freq: Every day | CUTANEOUS | Status: DC

## 2018-06-01 MED ORDER — METHOCARBAMOL 500 MG PO TABS: 500.0000 mg | ORAL_TABLET | Freq: Every evening | ORAL | Status: DC | PRN

## 2018-06-01 MED ORDER — SEVELAMER CARBONATE 800 MG PO TABS
800.0000 mg | ORAL_TABLET | Freq: Every day | ORAL | Status: DC
  Administered 2018-06-02: 800 mg via ORAL
  Filled 2018-06-01: qty 1

## 2018-06-01 MED ORDER — SODIUM CHLORIDE 0.9 % IV SOLN: 10.0000 mL/h | Freq: Once | INTRAVENOUS | Status: DC

## 2018-06-01 MED ORDER — PANTOPRAZOLE SODIUM 40 MG IV SOLR: 40.0000 mg | Freq: Two times a day (BID) | INTRAVENOUS | Status: DC

## 2018-06-01 MED ORDER — POLYVINYL ALCOHOL 1.4 % OP SOLN
1.0000 [drp] | OPHTHALMIC | Status: DC | PRN
  Filled 2018-06-01: qty 15

## 2018-06-01 MED ORDER — LEVOTHYROXINE SODIUM 50 MCG PO TABS
50.0000 ug | ORAL_TABLET | Freq: Every day | ORAL | Status: DC
  Administered 2018-06-02 – 2018-06-03 (×2): 50 ug via ORAL
  Filled 2018-06-01 (×3): qty 1

## 2018-06-01 MED ORDER — CARBOXYMETHYLCELLULOSE SOD PF 0.25 % OP SOLN: 1.0000 [drp] | OPHTHALMIC | Status: DC | PRN

## 2018-06-01 MED ORDER — INSULIN ASPART 100 UNIT/ML ~~LOC~~ SOLN
0.0000 [IU] | SUBCUTANEOUS | Status: DC
  Administered 2018-06-02: 1 [IU] via SUBCUTANEOUS
  Administered 2018-06-02: 3 [IU] via SUBCUTANEOUS

## 2018-06-01 MED ORDER — SIMVASTATIN 20 MG PO TABS
20.0000 mg | ORAL_TABLET | Freq: Every day | ORAL | Status: DC
  Administered 2018-06-01 – 2018-06-02 (×2): 20 mg via ORAL
  Filled 2018-06-01 (×2): qty 1

## 2018-06-01 MED ORDER — ONDANSETRON HCL 4 MG/2ML IJ SOLN: 4.0000 mg | Freq: Four times a day (QID) | INTRAMUSCULAR | Status: DC | PRN

## 2018-06-01 MED ORDER — FLUTICASONE PROPIONATE 50 MCG/ACT NA SUSP
1.0000 | Freq: Every day | NASAL | Status: DC
  Administered 2018-06-02 – 2018-06-03 (×2): 1 via NASAL
  Filled 2018-06-01: qty 16

## 2018-06-01 MED ORDER — LORATADINE 10 MG PO TABS: 10.0000 mg | ORAL_TABLET | Freq: Every day | ORAL | Status: DC | PRN

## 2018-06-01 MED ORDER — DONEPEZIL HCL 10 MG PO TABS
10.0000 mg | ORAL_TABLET | Freq: Every day | ORAL | Status: DC
  Administered 2018-06-01 – 2018-06-02 (×2): 10 mg via ORAL
  Filled 2018-06-01 (×2): qty 1

## 2018-06-01 MED ORDER — IOPAMIDOL (ISOVUE-300) INJECTION 61%
100.0000 mL | Freq: Once | INTRAVENOUS | Status: AC | PRN
  Administered 2018-06-01: 100 mL via INTRAVENOUS

## 2018-06-01 MED ORDER — FENTANYL CITRATE (PF) 100 MCG/2ML IJ SOLN
12.5000 ug | Freq: Once | INTRAMUSCULAR | Status: AC
  Administered 2018-06-01: 12.5 ug via INTRAVENOUS
  Filled 2018-06-01: qty 2

## 2018-06-01 MED ORDER — ONDANSETRON HCL 4 MG PO TABS: 4.0000 mg | ORAL_TABLET | Freq: Four times a day (QID) | ORAL | Status: DC | PRN

## 2018-06-01 MED ORDER — IOPAMIDOL (ISOVUE-300) INJECTION 61%
INTRAVENOUS | Status: AC
  Filled 2018-06-01: qty 100

## 2018-06-01 MED ORDER — DEXTROSE 50 % IV SOLN
INTRAVENOUS | Status: AC
  Administered 2018-06-01: 50 mL
  Filled 2018-06-01: qty 50

## 2018-06-01 NOTE — H&P (Signed)
History and Physical    [name] NFA:[id number] DOB: [birth year] DOA: 06/01/2018  Referring MD/NP/PA:  PCP: Elby Showers, MD   Outpatient Specialists:  Patient coming from:   Chief Complaint:   HPI: [name] is a 77 y.o. male with medical history significant for but not limited to type 2 diabetes, CAD and hypertension presenting with part 2 is history of black tarry stools followed by progressively worsening generalized weakness over last 3 days, associated with moderate left-sided abdominal pain and mild right lower quadrant pain.no history of nausea vomiting, no fever or chills, no cough or sputum production.    ED Course: at the ED patient was hemodynamically stable  With heme-positive stool and hemoglobin of 5.8. CT abdomen and pelvis done on account of abdominal complaints noted incidental finding of worsening prior MRI compression fracture, and diverticulosis. [facility] consulted and hospitalist requested for admission.  Review of Systems: As per HPI otherwise 10 point review of systems negative.   Past Medical History:  Diagnosis Date  . Allergy   . Anemia   . Arthritis   . Cataract    bil cateracts removed  . Coronary artery disease   . Diabetes mellitus    Type 2  . Diverticulitis   . ED (erectile dysfunction)   . Elevated homocysteine ([place])   . ESRD (end stage renal disease) on dialysis ([place]) 03/2015  . GERD (gastroesophageal reflux disease)    pepto   . Gout   . Hyperlipidemia   . Hypertension   . Hypothyroidism   . Pneumonia   . PVD (peripheral vascular disease) ([place])    has plastic aorta  . Renal insufficiency   . Seasonal allergies   . Shortness of breath dyspnea   . Sleep apnea    does not wear c-pap  . Thyroid disease     Past Surgical History:  Procedure Laterality Date  . aortobifemoral bypass    . AV FISTULA PLACEMENT Left 12/01/2013   Procedure: ARTERIOVENOUS (AV) FISTULA CREATION- LEFT BRACHIOCEPHALIC;  Surgeon: Angelia Mould, MD;  Location: [place];  Service: Vascular;  Laterality: Left;  . [place] TRANSPOSITION Right 07/27/2014   Procedure: BASCILIC VEIN TRANSPOSITION;  Surgeon: Angelia Mould, MD;  Location: [place];  Service: Vascular;  Laterality: Right;  . BREAST SURGERY     left - granulomatous mastitis  . COLONOSCOPY    . 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 EYE SURGERY Bilateral    cataracts  . HEMODIALYSIS INPATIENT  01/17/2018      . REVISON OF ARTERIOVENOUS FISTULA Left 02/09/2014   Procedure: REVISON OF LEFT ARTERIOVENOUS FISTULA - RESECTION OF RENDUNDANT VEIN;  Surgeon: Angelia Mould, MD;  Location: [place];  Service: Vascular;  Laterality: Left;  . SBO with lysis adhesions    . SHUNTOGRAM Left 04/19/2014   Procedure: FISTULOGRAM;  Surgeon: Angelia Mould, MD;  Location: [facility] CATH LAB;  Service: Cardiovascular;  Laterality: Left;  . UNILATERAL UPPER EXTREMEITY ANGIOGRAM N/A 07/12/2014   Procedure: UNILATERAL UPPER Anselmo Rod;  Surgeon: Angelia Mould, MD;  Location: [facility] CATH LAB;  Service: Cardiovascular;  Laterality: N/A;     reports that he quit smoking about 23 years ago. He has never used smokeless tobacco. He reports that he does not drink alcohol or use drugs.  Allergies  Allergen Reactions  . Penicillins Rash    Has patient had a PCN reaction causing immediate rash, facial/tongue/throat swelling,  SOB or lightheadedness with hypotension: Yes Has patient had a PCN reaction causing severe rash involving mucus membranes or skin necrosis: Yes Has patient had a PCN reaction that required hospitalization: No Has patient had a PCN reaction occurring within the last 10 years: No If all of the above answers are "NO", then may proceed with Cephalosporin use.     Family History  Problem Relation Age of Onset  . Aneurysm Mother   . Heart disease Father   . Stroke Father   . Hypertension Father   . Diabetes Father   . Dementia Neg Hx     . Colon cancer Neg Hx   . Esophageal cancer Neg Hx   . Pancreatic cancer Neg Hx   . Prostate cancer Neg Hx   . Rectal cancer Neg Hx   . Stomach cancer Neg Hx     Prior to Admission medications   Medication Sig Start Date End Date Taking? Authorizing Provider  acetaminophen (TYLENOL) 500 MG tablet Take 1,000 mg by mouth 2 (two) times daily as needed (for pain).    Yes [provider]  allopurinol (ZYLOPRIM) 100 MG tablet Take 100 mg by mouth daily as needed (pain).    Yes [provider]  aspirin 325 MG tablet Take 325 mg by mouth daily.    Yes [provider]  Carboxymethylcellulose Sod PF 0.25 % SOLN Place 1 drop into both eyes as needed (dry eyes).   Yes [provider]  clobetasol (TEMOVATE) 0.05 % external solution Apply 1 application topically 2 (two) times daily as needed (irritation). Apply to scalp 04/19/15  Yes [provider]  denosumab (PROLIA) 60 MG/ML SOSY injection Inject 60 mg into the skin every 6 (six) months.   Yes [provider]  donepezil (ARICEPT) 10 MG tablet Take 1 tablet (10 mg total) by mouth at bedtime. 05/06/18  Yes Dennie Bible, NP  FLUOCINOLONE ACETONIDE SCALP 0.01 % OIL Apply 1 application topically at bedtime. Apply to scalp 04/28/15  Yes [provider]  fluticasone (CUTIVATE) 0.05 % cream Apply 1 application topically 2 (two) times daily as needed (irritation). Apply to face 04/19/15  Yes [provider]  fluticasone (FLONASE) 50 MCG/ACT nasal spray Place 1 spray into both nostrils daily. Patient taking differently: Place 1 spray into both nostrils daily as needed for allergies.  10/08/14  Yes Mikhail, Velta Addison, DO  ketoconazole (NIZORAL) 2 % shampoo Apply 1 application topically 2 (two) times a week. Patient taking differently: Apply 1 application topically at bedtime.  03/31/15  Yes Baxley, Cresenciano Lick, MD  levothyroxine (SYNTHROID, LEVOTHROID) 50 MCG tablet Take 1 tablet (50 mcg total) by  mouth daily. 08/06/14  Yes Baxley, Cresenciano Lick, MD  liraglutide (VICTOZA) 18 MG/3ML SOPN Inject 0.1 mLs (0.6 mg total) into the skin daily. Patient taking differently: Inject 0.6 mg into the skin at bedtime.  05/27/18  Yes Elayne Snare, MD  loratadine (CLARITIN) 10 MG tablet Take 10 mg by mouth daily as needed for allergies.    Yes [provider]  methocarbamol (ROBAXIN) 500 MG tablet Take 1 tablet (500 mg total) by mouth at bedtime as needed for muscle spasms. 01/02/18  Yes Tanna Furry, MD  mupirocin ointment (BACTROBAN) 2 % Use as directed to affected areas Patient taking differently: Apply 1 application topically 3 (three) times daily as needed (to affected areas).  09/04/16  Yes Baxley, Cresenciano Lick, MD  Naftifine HCl (NAFTIN) 2 % CREA Apply to feet daily for fungal infection  Patient taking differently: Apply 1 application topically daily as needed (for fungal infection). Apply to feet 09/12/15  Yes Baxley, Cresenciano Lick, MD  nystatin-triamcinolone [facility] II) cream Apply 1 application topically 2 (two) times daily. 09/26/17  Yes Baxley, Cresenciano Lick, MD  omeprazole (PRILOSEC) 40 MG capsule Take 1 capsule (40 mg total) by mouth daily. 03/13/18  Yes Esterwood, Amy S, PA-C  RENVELA 800 MG tablet Take 800 mg by mouth daily with supper.  07/27/14  Yes [provider]  simvastatin (ZOCOR) 20 MG tablet Take 20 mg by mouth at bedtime.    Yes [provider]  Blood Glucose Monitoring Suppl (FREESTYLE FREEDOM LITE) w/Device KIT Use to check blood sugar 2 times per day dx code E11.65 03/08/16   Elayne Snare, MD  docusate sodium (COLACE) 100 MG capsule Take 1 capsule (100 mg total) by mouth daily. To avoid constipation from pain medication Patient not taking: Reported on 06/01/2018 01/02/18   Tanna Furry, MD  glucose blood (FREESTYLE LITE) test strip Use as instructed to check blood sugar 3 times daily. 04/22/18   Elayne Snare, MD  glucose blood (ONETOUCH VERIO) test strip Use to check blood sugar 2 times per day.  Dx code E11.65 03/01/16   Elayne Snare, MD  Insulin Pen Needle 31G X 5 MM MISC Use with Victoza pen once a day 04/22/18   Elayne Snare, MD  Lancets (FREESTYLE) lancets Use as instructed to check blood sugar 2 times per day dx code E11.65 02/22/16   Elayne Snare, MD  TRADJENTA 5 MG TABS tablet TAKE 1 TABLET(5 MG) BY MOUTH DAILY 04/21/18   Elayne Snare, MD    Physical Exam: Vitals:   06/01/18 1301 06/01/18 1305 06/01/18 1314 06/01/18 1315  BP:  (!) 134/54  (!) 109/37  Pulse:  78    Resp:  14  16  Temp:   98.4 F (36.9 C)   TempSrc:   Oral   SpO2: 100% 100%    Weight:   70.1 kg   Height:   5' 11" (1.803 m)       Constitutional: NAD, calm, comfortable Vitals:   06/01/18 1301 06/01/18 1305 06/01/18 1314 06/01/18 1315  BP:  (!) 134/54  (!) 109/37  Pulse:  78    Resp:  14  16  Temp:   98.4 F (36.9 C)   TempSrc:   Oral   SpO2: 100% 100%    Weight:   70.1 kg   Height:   5' 11" (1.803 m)    Eyes: PERRL,(+) scleral pallor ENMT: Mucous membranes are moist. Posterior pharynx clear of any exudate or lesions.Normal dentition.  Neck: normal, supple, no masses, no thyromegaly Respiratory: clear to auscultation bilaterally  Cardiovascular: Regular rate and rhythm, no murmurs / rubs / gallops. No extremity edema. 2+ pedal pulses. No carotid bruits.  Abdomen: no tenderness, no masses palpated. No hepatosplenomegaly. Bowel sounds positive.  Musculoskeletal: no clubbing / cyanosis. Exquisite tenderness on palpation of the left lower ribs noted  Neurologic: alert and oriented otherwise no acute focal deficits     Labs on Admission: I have personally reviewed following labs and imaging studies  CBC: Recent Labs  Lab 06/01/18 1358  WBC 10.0  NEUTROABS 6.9  HGB 5.8*  HCT 18.7*  MCV 109.4*  PLT 748   Basic Metabolic Panel: Recent Labs  Lab 06/01/18 1358  NA 142  K 4.6  CL 101  CO2 25  GLUCOSE 102*  BUN 31*  CREATININE 5.62*  CALCIUM 9.1  GFR: Estimated Creatinine Clearance:  11.1 mL/min (A) (by C-G formula based on SCr of 5.62 mg/dL (H)). Liver Function Tests: Recent Labs  Lab 06/01/18 1358  AST 26  ALT 12  ALKPHOS 75  BILITOT 0.6  PROT 5.0*  ALBUMIN 2.6*   Recent Labs  Lab 06/01/18 1358  LIPASE 70*   No results for input(s): AMMONIA in the last 168 hours. Coagulation Profile: No results for input(s): INR, PROTIME in the last 168 hours. Cardiac Enzymes: No results for input(s): CKTOTAL, CKMB, CKMBINDEX, TROPONINI in the last 168 hours. BNP (last 3 results) No results for input(s): PROBNP in the last 8760 hours. HbA1C: No results for input(s): HGBA1C in the last 72 hours. CBG: No results for input(s): GLUCAP in the last 168 hours. Lipid Profile: No results for input(s): CHOL, HDL, LDLCALC, TRIG, CHOLHDL, LDLDIRECT in the last 72 hours. Thyroid Function Tests: No results for input(s): TSH, T4TOTAL, FREET4, T3FREE, THYROIDAB in the last 72 hours. Anemia Panel: No results for input(s): VITAMINB12, FOLATE, FERRITIN, TIBC, IRON, RETICCTPCT in the last 72 hours. Urine analysis:    Component Value Date/Time   COLORURINE YELLOW 01/23/2018 2043   APPEARANCEUR HAZY (A) 01/23/2018 2043   LABSPEC 1.015 01/23/2018 2043   PHURINE 9.0 (H) 01/23/2018 2043   GLUCOSEU NEGATIVE 01/23/2018 2043   HGBUR NEGATIVE 01/23/2018 2043   BILIRUBINUR NEGATIVE 01/23/2018 2043   BILIRUBINUR neg 02/14/2015 [place] 01/23/2018 2043   PROTEINUR 100 (A) 01/23/2018 2043   UROBILINOGEN 0.2 02/20/2015 0944   NITRITE NEGATIVE 01/23/2018 2043   LEUKOCYTESUR TRACE (A) 01/23/2018 2043   Sepsis Labs: _0 (procalcitonin:4,lacticidven:4) )No results found for this or any previous visit (from the past 240 hour(s)).   Radiological Exams on Admission: Ct Abdomen Pelvis W Contrast  Result Date: 06/01/2018 CLINICAL DATA:  Pain on the left.  Dialysis patient. EXAM: CT ABDOMEN AND PELVIS WITH CONTRAST TECHNIQUE: Multidetector CT imaging of the abdomen and  pelvis was performed using the standard protocol following bolus administration of intravenous contrast. CONTRAST:  115m ISOVUE-300 IOPAMIDOL (ISOVUE-300) INJECTION 61% COMPARISON:  Jan 23, 2018 FINDINGS: Lower chest: No acute abnormality. Hepatobiliary: Multiple tiny low-attenuation lesions in the liver too small to characterize but almost certainly cysts. No solid masses are noted. Mild hepatic steatosis. The portal vein is patent. The gallbladder is unremarkable. Pancreas: Unremarkable. No pancreatic ductal dilatation or surrounding inflammatory changes. Spleen: Normal in size without focal abnormality. Adrenals/Urinary Tract: Adrenal glands are normal. Kidneys are atrophic with numerous bilateral cysts. No hydronephrosis or stones. No suspicious solid masses. The ureters are normal. The bladder is decompressed limiting evaluation. There is mild increased attenuation in the fat adjacent to the bladder. Stomach/Bowel: The stomach and small bowel are normal. Colonic diverticulosis is seen without diverticulitis. The remainder of the colon is normal. The appendix is unremarkable. Vascular/Lymphatic: The patient is status post aorta aortic bypass. Atherosclerotic changes are seen in the native aorta. Atherosclerotic changes are seen at the origin of the SMA and renal arteries, similar in the interval. No aneurysm or dissection. Atherosclerotic changes extend into the femoral and iliac vessels. No adenopathy. Reproductive: Prostate is unremarkable. Other: No free air or free fluid. Musculoskeletal: Patient is status post vertebroplasty of L3, stable. A compression fracture of L5 is stable. A compression fracture L2 is stable. The compression fracture of L1 has worsened in the interval with more loss of height. Mild wedging of T12 and T11 are stable. IMPRESSION: 1. The L1 compression fracture has worsened since May of 2019  with increased loss of height. Other multilevel compression fractures are stable. 2. The kidneys  are atrophic with multiple cysts. No acute abnormalities. 3. Multiple probable cysts in the liver. 4. Hepatic steatosis. 5. Colonic diverticulosis without diverticulitis. 6. Aortic bypass.  Atherosclerosis. Electronically Signed   By: Dorise Bullion III M.D   On: 06/01/2018 14:55    EKG: Independently reviewed.  Assessment/Plan Active Problems:   GIB (gastrointestinal bleeding)    1.GI bleed: Admitting hemoglobin 5.8 Serial H&H PRBC transfusion 2 ordered GI consulted   2. Acute blood loss anemia: Positive Hemoccult at the ED Hemoglobin 5.8 on admission PRBC transfusion Monitor H&H  3. L1 compression fracture: Worsened from May 10 19:2 CT abdomen Supportive care Consider orthopedic follow-up- but seems stable at this time since his complain of pain is not displayed by this finding X-ray rib series ordered      DVT prophylaxis: (SCD's) Code Status: (Full Family Communication: patient and wife at bedside  Disposition Plan: TBD Consults called: [facility] consulted by ED physician Admission status: (inpatient / tele)   Benito Mccreedy MD [facility] Pager [phone]  If 7PM-7AM, please contact night-coverage [email or web address] Password [facility]  06/01/2018, 3:42 PM

## 2018-06-01 NOTE — ED Provider Notes (Signed)
[facility] EMERGENCY DEPARTMENT Provider Note   CSN: [id number] Arrival date & time: 06/01/18  1253     History   Chief Complaint Chief Complaint  Patient presents with  . Weakness    HPI [name] is a 77 y.o. male.  HPI Patient is a poor historian, but presents with his wife who assists with the HPI. Patient seemingly is more concerned about a new left-sided abdominal pain, right lower quadrant abdominal pain, nausea. Unclear onset, but it seems as though at least over the past 2 days the pain is been increasingly severe, is persistently in the after mentioned areas. No clear alleviating or exacerbating factors. Patient also complains of dark stool, unclear time of onset for this. No lightheadedness, no chest pain, no syncope.  Family also notes that the patient is weaker than usual.  He notes a history of what sounds like prior evaluation for anemia, stating that he has had prior swallow studies.    Past Medical History:  Diagnosis Date  . Allergy   . Anemia   . Arthritis   . Cataract    bil cateracts removed  . Coronary artery disease   . Diabetes mellitus    Type 2  . Diverticulitis   . ED (erectile dysfunction)   . 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 ESRD (end stage renal disease) on dialysis ([place]) 03/2015  . GERD (gastroesophageal reflux disease)    pepto   . Gout   . Hyperlipidemia   . Hypertension   . Hypothyroidism   . Pneumonia   . PVD (peripheral vascular disease) ([place])    has plastic aorta  . Renal insufficiency   . Seasonal allergies   . Shortness of breath dyspnea   . Sleep apnea    does not wear c-pap  . Thyroid disease     Patient Active Problem List   Diagnosis Date Noted  . Low back pain 01/17/2018  . Anorexia   . Dyspnea   . Macrocytic anemia 06/04/2016  . Thrombocytopenia ([place]) 06/04/2016  . Exertional dyspnea 06/04/2016  . Arm paresthesia, left 06/04/2016  . Dyspnea on exertion 06/04/2016  . Tenderness  of right calf 06/04/2016  . Mild cognitive impairment with memory loss 01/12/2016  . ESRD on dialysis ([place]) 05/17/2015  . Hypoglycemia   . Weakness 02/18/2015  . OSA (obstructive sleep apnea) 02/02/2015  . Hyperkalemia 10/05/2014  . Chronic kidney disease (CKD), stage IV (severe) ([place]) 04/07/2014  . End stage renal disease ([place]) 11/18/2013  . BPH (benign prostatic hyperplasia) 11/22/2012  . Peripheral vascular disease ([place]) 12/24/2011  . Hyperparathyroidism ([place]) 12/24/2011  . Hypothyroidism 12/24/2011  . Allergic rhinitis 12/24/2011  . Erectile dysfunction 12/24/2011  . Insulin dependent diabetes mellitus ([place]) 09/03/2008  . Hyperlipidemia 09/03/2008  . Essential hypertension 08/30/2008  . PANCREATITIS, HX OF 08/30/2008  . RENAL FAILURE, ACUTE, HX OF 08/30/2008  . DIVERTICULOSIS, COLON 06/08/2003    Past Surgical History:  Procedure Laterality Date  . aortobifemoral bypass    . AV FISTULA PLACEMENT Left 12/01/2013   Procedure: ARTERIOVENOUS (AV) FISTULA CREATION- LEFT BRACHIOCEPHALIC;  Surgeon: Angelia Mould, MD;  Location: [facility];  Service: Vascular;  Laterality: Left;  . [place] TRANSPOSITION Right 07/27/2014   Procedure: BASCILIC VEIN TRANSPOSITION;  Surgeon: Angelia Mould, MD;  Location: [facility];  Service: Vascular;  Laterality: Right;  . BREAST SURGERY     left - granulomatous mastitis  . COLONOSCOPY    . 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 EYE SURGERY Bilateral    cataracts  . HEMODIALYSIS INPATIENT  01/17/2018      . REVISON OF ARTERIOVENOUS FISTULA Left 02/09/2014   Procedure: REVISON OF LEFT ARTERIOVENOUS FISTULA - RESECTION OF RENDUNDANT VEIN;  Surgeon: Angelia Mould, MD;  Location: [facility];  Service: Vascular;  Laterality: Left;  . SBO with lysis adhesions    . SHUNTOGRAM Left 04/19/2014   Procedure: FISTULOGRAM;  Surgeon: Angelia Mould, MD;  Location: [facility] CATH LAB;  Service: Cardiovascular;  Laterality: Left;  .  UNILATERAL UPPER EXTREMEITY ANGIOGRAM N/A 07/12/2014   Procedure: UNILATERAL UPPER Anselmo Rod;  Surgeon: Angelia Mould, MD;  Location: [facility] CATH LAB;  Service: Cardiovascular;  Laterality: N/A;        Home Medications    Prior to Admission medications   Medication Sig Start Date End Date Taking? Authorizing Provider  acetaminophen (TYLENOL) 500 MG tablet Take 1,000 mg by mouth 2 (two) times daily as needed (for pain).    Yes [provider]  allopurinol (ZYLOPRIM) 100 MG tablet Take 100 mg by mouth daily as needed (pain).    Yes [provider]  aspirin 325 MG tablet Take 325 mg by mouth daily.    Yes [provider]  Carboxymethylcellulose Sod PF 0.25 % SOLN Place 1 drop into both eyes as needed (dry eyes).   Yes [provider]  clobetasol (TEMOVATE) 0.05 % external solution Apply 1 application topically 2 (two) times daily as needed (irritation). Apply to scalp 04/19/15  Yes [provider]  denosumab (PROLIA) 60 MG/ML SOSY injection Inject 60 mg into the skin every 6 (six) months.   Yes [provider]  donepezil (ARICEPT) 10 MG tablet Take 1 tablet (10 mg total) by mouth at bedtime. 05/06/18  Yes Dennie Bible, NP  FLUOCINOLONE ACETONIDE SCALP 0.01 % OIL Apply 1 application topically at bedtime. Apply to scalp 04/28/15  Yes [provider]  fluticasone (CUTIVATE) 0.05 % cream Apply 1 application topically 2 (two) times daily as needed (irritation). Apply to face 04/19/15  Yes [provider]  fluticasone (FLONASE) 50 MCG/ACT nasal spray Place 1 spray into both nostrils daily. Patient taking differently: Place 1 spray into both nostrils daily as needed for allergies.  10/08/14  Yes Mikhail, Velta Addison, DO  ketoconazole (NIZORAL) 2 % shampoo Apply 1 application topically 2 (two) times a week. Patient taking differently: Apply 1 application topically at bedtime.  03/31/15  Yes Baxley, Cresenciano Lick, MD    levothyroxine (SYNTHROID, LEVOTHROID) 50 MCG tablet Take 1 tablet (50 mcg total) by mouth daily. 08/06/14  Yes Baxley, Cresenciano Lick, MD  liraglutide (VICTOZA) 18 MG/3ML SOPN Inject 0.1 mLs (0.6 mg total) into the skin daily. Patient taking differently: Inject 0.6 mg into the skin at bedtime.  05/27/18  Yes Elayne Snare, MD  loratadine (CLARITIN) 10 MG tablet Take 10 mg by mouth daily as needed for allergies.    Yes [provider]  methocarbamol (ROBAXIN) 500 MG tablet Take 1 tablet (500 mg total) by mouth at bedtime as needed for muscle spasms. 01/02/18  Yes Tanna Furry, MD  mupirocin ointment (BACTROBAN) 2 % Use as directed to affected areas Patient taking differently: Apply 1 application topically 3 (three) times daily as needed (to affected areas).  09/04/16  Yes Baxley, Cresenciano Lick, MD  Naftifine HCl (NAFTIN) 2 % CREA Apply to feet daily for fungal infection Patient taking differently: Apply 1 application topically  daily as needed (for fungal infection). Apply to feet 09/12/15  Yes Baxley, Cresenciano Lick, MD  nystatin-triamcinolone [facility] II) cream Apply 1 application topically 2 (two) times daily. 09/26/17  Yes Baxley, Cresenciano Lick, MD  omeprazole (PRILOSEC) 40 MG capsule Take 1 capsule (40 mg total) by mouth daily. 03/13/18  Yes Esterwood, Amy S, PA-C  RENVELA 800 MG tablet Take 800 mg by mouth daily with supper.  07/27/14  Yes [provider]  simvastatin (ZOCOR) 20 MG tablet Take 20 mg by mouth at bedtime.    Yes [provider]  Blood Glucose Monitoring Suppl (FREESTYLE FREEDOM LITE) w/Device KIT Use to check blood sugar 2 times per day dx code E11.65 03/08/16   Elayne Snare, MD  docusate sodium (COLACE) 100 MG capsule Take 1 capsule (100 mg total) by mouth daily. To avoid constipation from pain medication Patient not taking: Reported on 06/01/2018 01/02/18   Tanna Furry, MD  glucose blood (FREESTYLE LITE) test strip Use as instructed to check blood sugar 3 times daily. 04/22/18   Elayne Snare, MD   glucose blood (ONETOUCH VERIO) test strip Use to check blood sugar 2 times per day. Dx code E11.65 03/01/16   Elayne Snare, MD  Insulin Pen Needle 31G X 5 MM MISC Use with Victoza pen once a day 04/22/18   Elayne Snare, MD  Lancets (FREESTYLE) lancets Use as instructed to check blood sugar 2 times per day dx code E11.65 02/22/16   Elayne Snare, MD  TRADJENTA 5 MG TABS tablet TAKE 1 TABLET(5 MG) BY MOUTH DAILY 04/21/18   Elayne Snare, MD    Family History Family History  Problem Relation Age of Onset  . Aneurysm Mother   . Heart disease Father   . Stroke Father   . Hypertension Father   . Diabetes Father   . Dementia Neg Hx   . Colon cancer Neg Hx   . Esophageal cancer Neg Hx   . Pancreatic cancer Neg Hx   . Prostate cancer Neg Hx   . Rectal cancer Neg Hx   . Stomach cancer Neg Hx     Social History Social History   Tobacco Use  . Smoking status: Former Smoker    Last attempt to quit: 09/24/1994    Years since quitting: 23.7  . Smokeless tobacco: Never Used  Substance Use Topics  . Alcohol use: No    Alcohol/week: 0.0 standard drinks    Comment: Quit Oct. 1977 ("somewhat heavy")  . Drug use: No     Allergies   Penicillins   Review of Systems Review of Systems  Unable to perform ROS: Dementia     Physical Exam Updated Vital Signs BP (!) 109/37   Pulse 78   Temp 98.4 F (36.9 C) (Oral)   Resp 16   Ht _0  (1.803 m)   Wt 70.1 kg   SpO2 100%   BMI 21.55 kg/m    Physical Exam  Constitutional: He appears well-developed.  HENT:  Head: Normocephalic and atraumatic.  Eyes: Conjunctivae and EOM are normal.  Cardiovascular: Normal rate and regular rhythm.  Pulmonary/Chest: Effort normal. No stridor. No respiratory distress.  Abdominal: He exhibits no distension. There is tenderness.    Genitourinary: Rectal exam shows guaiac positive stool.  Musculoskeletal: He exhibits no edema.  Neurological: He is alert. He displays atrophy. He displays no tremor. He displays  no seizure activity.  Skin: Skin is warm and dry.  Psychiatric: His mood appears anxious. Cognition and memory are  impaired.  Nursing note and vitals reviewed.    ED Treatments / Results  Labs (all labs ordered are listed, but only abnormal results are displayed) Labs Reviewed  COMPREHENSIVE METABOLIC PANEL - Abnormal; Notable for the following components:      Result Value   Glucose, Bld 102 (*)    BUN 31 (*)    Creatinine, Ser 5.62 (*)    Total Protein 5.0 (*)    Albumin 2.6 (*)    GFR calc non Af Amer 9 (*)    GFR calc Af Amer 10 (*)    Anion gap 16 (*)    All other components within normal limits  LIPASE, BLOOD - Abnormal; Notable for the following components:   Lipase 70 (*)    All other components within normal limits  CBC WITH DIFFERENTIAL/PLATELET - Abnormal; Notable for the following components:   RBC 1.71 (*)    Hemoglobin 5.8 (*)    HCT 18.7 (*)    MCV 109.4 (*)    RDW 21.3 (*)    All other components within normal limits  POC OCCULT BLOOD, ED - Abnormal; Notable for the following components:   Fecal Occult Bld POSITIVE (*)    All other components within normal limits  URINALYSIS, ROUTINE W REFLEX MICROSCOPIC  OCCULT BLOOD X 1 CARD TO LAB, STOOL  TYPE AND SCREEN    EKG EKG Interpretation  Date/Time:  Sunday June 01 2018 12:56:56 EDT Ventricular Rate:  78 PR Interval:    QRS Duration: 77 QT Interval:  384 QTC Calculation: 438 R Axis:   1 Text Interpretation:  Sinus rhythm Low voltage, extremity and precordial leads Probable anteroseptal infarct, old Borderline ECG Confirmed by Carmin Muskrat [phone]) on 06/01/2018 12:59:27 PM   Radiology Ct Abdomen Pelvis W Contrast  Result Date: 06/01/2018 CLINICAL DATA:  Pain on the left.  Dialysis patient. EXAM: CT ABDOMEN AND PELVIS WITH CONTRAST TECHNIQUE: Multidetector CT imaging of the abdomen and pelvis was performed using the standard protocol following bolus administration of intravenous contrast.  CONTRAST:  142m ISOVUE-300 IOPAMIDOL (ISOVUE-300) INJECTION 61% COMPARISON:  Jan 23, 2018 FINDINGS: Lower chest: No acute abnormality. Hepatobiliary: Multiple tiny low-attenuation lesions in the liver too small to characterize but almost certainly cysts. No solid masses are noted. Mild hepatic steatosis. The portal vein is patent. The gallbladder is unremarkable. Pancreas: Unremarkable. No pancreatic ductal dilatation or surrounding inflammatory changes. Spleen: Normal in size without focal abnormality. Adrenals/Urinary Tract: Adrenal glands are normal. Kidneys are atrophic with numerous bilateral cysts. No hydronephrosis or stones. No suspicious solid masses. The ureters are normal. The bladder is decompressed limiting evaluation. There is mild increased attenuation in the fat adjacent to the bladder. Stomach/Bowel: The stomach and small bowel are normal. Colonic diverticulosis is seen without diverticulitis. The remainder of the colon is normal. The appendix is unremarkable. Vascular/Lymphatic: The patient is status post aorta aortic bypass. Atherosclerotic changes are seen in the native aorta. Atherosclerotic changes are seen at the origin of the SMA and renal arteries, similar in the interval. No aneurysm or dissection. Atherosclerotic changes extend into the femoral and iliac vessels. No adenopathy. Reproductive: Prostate is unremarkable. Other: No free air or free fluid. Musculoskeletal: Patient is status post vertebroplasty of L3, stable. A compression fracture of L5 is stable. A compression fracture L2 is stable. The compression fracture of L1 has worsened in the interval with more loss of height. Mild wedging of T12 and T11 are stable. IMPRESSION: 1. The L1 compression fracture has worsened  since May of 2019 with increased loss of height. Other multilevel compression fractures are stable. 2. The kidneys are atrophic with multiple cysts. No acute abnormalities. 3. Multiple probable cysts in the liver. 4.  Hepatic steatosis. 5. Colonic diverticulosis without diverticulitis. 6. Aortic bypass.  Atherosclerosis. Electronically Signed   By: Dorise Bullion III M.D   On: 06/01/2018 14:55    Procedures Procedures (including critical care time)  Medications Ordered in ED Medications  iopamidol (ISOVUE-300) 61 % injection (has no administration in time range)  fentaNYL (SUBLIMAZE) injection 12.5 mcg (12.5 mcg Intravenous Given 06/01/18 1434)  iopamidol (ISOVUE-300) 61 % injection 100 mL (100 mLs Intravenous Contrast Given 06/01/18 1407)     Initial Impression / Assessment and Plan / ED Course  I have reviewed the triage vital signs and the nursing notes.  Pertinent labs & imaging results that were available during my care of the patient were reviewed by me and considered in my medical decision making (see chart for details).    Labs notable for hemoglobin 5.8. With Hemoccult positive status, the patient will receive blood transfusion.  On this there was discussed with the patient and his family members who agree with this.   3:24 PM Patient in no distress, remaining labs reassuring, aside from anemia. CT scan consistent with diverticulosis, no acute diverticulitis. Patient is preparing for transfusion. This elderly male presents with abdominal pain, weakness is found to have critical hemoglobin value, Hemoccult positive status. Patient required initiation of transfusion and admission for further evaluation and management.  Final Clinical Impressions(s) / ED Diagnoses  Symptomatic anemia  occult gi bleed  CRITICAL CARE Performed by: Carmin Muskrat Total critical care time: 35 minutes Critical care time was exclusive of separately billable procedures and treating other patients. Critical care was necessary to treat or prevent imminent or life-threatening deterioration. Critical care was time spent personally by me on the following activities: development of treatment plan with patient and/or  surrogate as well as nursing, discussions with consultants, evaluation of patient's response to treatment, examination of patient, obtaining history from patient or surrogate, ordering and performing treatments and interventions, ordering and review of laboratory studies, ordering and review of radiographic studies, pulse oximetry and re-evaluation of patient's condition.   Carmin Muskrat, MD 06/01/18 1525

## 2018-06-01 NOTE — ED Notes (Signed)
Patient transported to CT 

## 2018-06-01 NOTE — ED Notes (Signed)
CRITICAL VALUE ALERT  Critical Value:  HGB 5.8 Date & Time Notied: 06/01/2018 @ 14:25 pm Provider Notified: MD Vanita Panda

## 2018-06-01 NOTE — ED Triage Notes (Signed)
Per EMS, pt from home with a c/o of generalized weakness x3 days, black tarry stools (x2 weeks), and LUQ abd pain that radiates into the RLQ. Dialysis M, W, F and compliant with tx. Dialysis center reported that the pt's Hgb is low pt states that he "thinks it's 7."

## 2018-06-02 DIAGNOSIS — R195 Other fecal abnormalities: Secondary | ICD-10-CM

## 2018-06-02 DIAGNOSIS — N186 End stage renal disease: Secondary | ICD-10-CM

## 2018-06-02 DIAGNOSIS — I10 Essential (primary) hypertension: Secondary | ICD-10-CM

## 2018-06-02 DIAGNOSIS — K2970 Gastritis, unspecified, without bleeding: Secondary | ICD-10-CM

## 2018-06-02 DIAGNOSIS — K921 Melena: Secondary | ICD-10-CM

## 2018-06-02 DIAGNOSIS — D509 Iron deficiency anemia, unspecified: Secondary | ICD-10-CM

## 2018-06-02 DIAGNOSIS — K222 Esophageal obstruction: Secondary | ICD-10-CM

## 2018-06-02 LAB — COMPREHENSIVE METABOLIC PANEL
ALT: 10 U/L (ref 0–44)
AST: 23 U/L (ref 15–41)
Albumin: 2.5 g/dL — ABNORMAL LOW (ref 3.5–5.0)
Alkaline Phosphatase: 59 U/L (ref 38–126)
Anion gap: 13 (ref 5–15)
BUN: 34 mg/dL — ABNORMAL HIGH (ref 8–23)
CO2: 24 mmol/L (ref 22–32)
Calcium: 8.7 mg/dL — ABNORMAL LOW (ref 8.9–10.3)
Chloride: 104 mmol/L (ref 98–111)
Creatinine, Ser: 6.68 mg/dL — ABNORMAL HIGH (ref 0.61–1.24)
GFR calc Af Amer: 8 mL/min — ABNORMAL LOW (ref >60)
GFR calc non Af Amer: 7 mL/min — ABNORMAL LOW (ref >60)
Glucose, Bld: 77 mg/dL (ref 70–99)
Potassium: 4.7 mmol/L (ref 3.5–5.1)
Sodium: 141 mmol/L (ref 135–145)
Total Bilirubin: 0.6 mg/dL (ref 0.3–1.2)
Total Protein: 5.1 g/dL — ABNORMAL LOW (ref 6.5–8.1)

## 2018-06-02 LAB — CBC
HCT: 26.2 % — ABNORMAL LOW (ref 39.0–52.0)
Hemoglobin: 8.6 g/dL — ABNORMAL LOW (ref 13.0–17.0)
MCH: 31.9 pg (ref 26.0–34.0)
MCHC: 32.8 g/dL (ref 30.0–36.0)
MCV: 97 fL (ref 78.0–100.0)
Platelets: 208 10*3/uL (ref 150–400)
RBC: 2.7 MIL/uL — ABNORMAL LOW (ref 4.22–5.81)
RDW: 21.7 % — ABNORMAL HIGH (ref 11.5–15.5)
WBC: 8 10*3/uL (ref 4.0–10.5)

## 2018-06-02 LAB — BPAM RBC
Blood Product Expiration Date: 201909152359
Blood Product Expiration Date: 201910092359
ISSUE DATE / TIME: 201909081821
ISSUE DATE / TIME: 201909082229
Unit Type and Rh: 5100
Unit Type and Rh: 5100

## 2018-06-02 LAB — TYPE AND SCREEN
ABO/RH(D): O POS
Antibody Screen: NEGATIVE
Unit division: 0
Unit division: 0

## 2018-06-02 LAB — MRSA PCR SCREENING: MRSA by PCR: NEGATIVE

## 2018-06-02 LAB — GLUCOSE, CAPILLARY
Glucose-Capillary: 143 mg/dL — ABNORMAL HIGH (ref 70–99)
Glucose-Capillary: 220 mg/dL — ABNORMAL HIGH (ref 70–99)
Glucose-Capillary: 71 mg/dL (ref 70–99)
Glucose-Capillary: 77 mg/dL (ref 70–99)
Glucose-Capillary: 80 mg/dL (ref 70–99)
Glucose-Capillary: 82 mg/dL (ref 70–99)

## 2018-06-02 LAB — VITAMIN B12: Vitamin B-12: 254 pg/mL (ref 180–914)

## 2018-06-02 LAB — HEMOGLOBIN AND HEMATOCRIT, BLOOD
HCT: 29.2 % — ABNORMAL LOW (ref 39.0–52.0)
Hemoglobin: 9.7 g/dL — ABNORMAL LOW (ref 13.0–17.0)

## 2018-06-02 MED ORDER — CALCITRIOL 0.25 MCG PO CAPS
ORAL_CAPSULE | ORAL | Status: AC
  Filled 2018-06-02: qty 1

## 2018-06-02 MED ORDER — ACETAMINOPHEN 325 MG PO TABS
650.0000 mg | ORAL_TABLET | Freq: Four times a day (QID) | ORAL | Status: DC | PRN
  Administered 2018-06-02 – 2018-06-03 (×3): 650 mg via ORAL
  Filled 2018-06-02 (×3): qty 2

## 2018-06-02 MED ORDER — CALCITRIOL 0.5 MCG PO CAPS
ORAL_CAPSULE | ORAL | Status: AC
  Filled 2018-06-02: qty 4

## 2018-06-02 MED ORDER — RENA-VITE PO TABS
1.0000 | ORAL_TABLET | Freq: Every day | ORAL | Status: DC
  Administered 2018-06-02: 1 via ORAL
  Filled 2018-06-02 (×2): qty 1

## 2018-06-02 MED ORDER — PANTOPRAZOLE SODIUM 40 MG PO TBEC
40.0000 mg | DELAYED_RELEASE_TABLET | Freq: Every day | ORAL | Status: DC
  Administered 2018-06-02 – 2018-06-03 (×2): 40 mg via ORAL
  Filled 2018-06-02 (×2): qty 1

## 2018-06-02 MED ORDER — CALCITRIOL 0.5 MCG PO CAPS
2.2500 ug | ORAL_CAPSULE | ORAL | Status: DC
  Administered 2018-06-02: 2.25 ug via ORAL

## 2018-06-02 MED ORDER — SODIUM CHLORIDE 0.9 % IV SOLN
125.0000 mg | INTRAVENOUS | Status: DC
  Administered 2018-06-02: 125 mg via INTRAVENOUS
  Filled 2018-06-02 (×2): qty 10

## 2018-06-02 NOTE — Procedures (Signed)
I was present at this session.  I have reviewed the session itself and made appropriate changes.HD via RUA AVF,  Access press ok. bp drop early in HD, no sx ? Accurate.  Even. Jeneen Rinks Svea Pusch 9/9/20194:16 PM

## 2018-06-02 NOTE — Consult Note (Addendum)
[facility] Renal Consultation Note  Indication for Consultation:  Management of ESRD/hemodialysis; anemia, hypertension/volume and secondary hyperparathyroidism  HPI: [name] is a 77 y.o. 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 GI wu in process.  Noted  At his op kidney center HGB dropping past mmonth 9.5>8.2>7.1( 05/28/18) and told to come to ER for evaluation Saturday OP hgb dropping .reports of dark tarry stools as Op . Hgb 5.8 on admit  He reports some LUA quad  pain off and on weeks  but is denying sob, chest pain , N/V he ioHe reports some loose stools and wt loss past several months   Loose stools Recently worked up  op Gastric emptying study delayed emptying .HeIs compliant with HD.On chronic ASA.   Past Medical History:  Diagnosis Date  . Allergy   . Anemia   . Arthritis   . Cataract    bil cateracts removed  . Coronary artery disease   . Diabetes mellitus    Type 2  . Diverticulitis   . ED (erectile dysfunction)   . Elevated homocysteine ([place])   . ESRD (end stage renal disease) on dialysis ([place]) 03/2015  . GERD (gastroesophageal reflux disease)    pepto   . Gout   . Hyperlipidemia   . Hypertension   . Hypothyroidism   . Pneumonia   . PVD (peripheral vascular disease) ([place])    has plastic aorta  . Renal insufficiency   . Seasonal allergies   . Shortness of breath dyspnea   . Sleep apnea    does not wear c-pap  . Thyroid disease     Past Surgical History:  Procedure Laterality Date  . aortobifemoral bypass    . AV FISTULA PLACEMENT Left 12/01/2013   Procedure: ARTERIOVENOUS (AV) FISTULA CREATION- LEFT BRACHIOCEPHALIC;  Surgeon: Angelia Mould, MD;  Location: [facility];  Service: Vascular;  Laterality: Left;  .  [place] TRANSPOSITION Right 07/27/2014   Procedure: BASCILIC VEIN TRANSPOSITION;  Surgeon: Angelia Mould, MD;  Location: [facility];  Service: Vascular;  Laterality: Right;  . BREAST SURGERY     left - granulomatous mastitis  . COLONOSCOPY    . 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 EYE SURGERY Bilateral    cataracts  . HEMODIALYSIS INPATIENT  01/17/2018      . REVISON OF ARTERIOVENOUS FISTULA Left 02/09/2014   Procedure: REVISON OF LEFT ARTERIOVENOUS FISTULA - RESECTION OF RENDUNDANT VEIN;  Surgeon: Angelia Mould, MD;  Location: [facility];  Service: Vascular;  Laterality: Left;  . SBO with lysis adhesions    . SHUNTOGRAM Left 04/19/2014   Procedure: FISTULOGRAM;  Surgeon: Angelia Mould, MD;  Location: [facility] CATH LAB;  Service: Cardiovascular;  Laterality: Left;  . UNILATERAL UPPER EXTREMEITY ANGIOGRAM N/A 07/12/2014   Procedure: UNILATERAL UPPER Anselmo Rod;  Surgeon: Angelia Mould, MD;  Location: [facility] C[facility] LAB;  Service: Cardiovascular;  Laterality: N/A;      Family History  Problem Relation Age of Onset  . Aneurysm Mother   . Heart disease Father   . Stroke Father   . Hypertension Father   . Diabetes Father   . Dementia Neg Hx   . Colon cancer Neg Hx   . Esophageal cancer Neg Hx   .  Pancreatic cancer Neg Hx   . Prostate cancer Neg Hx   . Rectal cancer Neg Hx   . Stomach cancer Neg Hx       reports that he quit smoking about 23 years ago. He has never used smokeless tobacco. He reports that he does not drink alcohol or use drugs.   Allergies  Allergen Reactions  . Penicillins Rash    Has patient had a PCN reaction causing immediate rash, facial/tongue/throat swelling, SOB or lightheadedness with hypotension: Yes Has patient had a PCN reaction causing severe rash involving mucus membranes or skin necrosis: Yes Has patient had a PCN reaction that required hospitalization: No Has patient had a PCN reaction occurring within the last  10 years: No If all of the above answers are "NO", then may proceed with Cephalosporin use.     Prior to Admission medications   Medication Sig Start Date End Date Taking? Authorizing Provider  acetaminophen (TYLENOL) 500 MG tablet Take 1,000 mg by mouth 2 (two) times daily as needed (for pain).    Yes [provider]  allopurinol (ZYLOPRIM) 100 MG tablet Take 100 mg by mouth daily as needed (pain).    Yes [provider]  aspirin 325 MG tablet Take 325 mg by mouth daily.    Yes [provider]  Carboxymethylcellulose Sod PF 0.25 % SOLN Place 1 drop into both eyes as needed (dry eyes).   Yes [provider]  clobetasol (TEMOVATE) 0.05 % external solution Apply 1 application topically 2 (two) times daily as needed (irritation). Apply to scalp 04/19/15  Yes [provider]  denosumab (PROLIA) 60 MG/ML SOSY injection Inject 60 mg into the skin every 6 (six) months.   Yes [provider]  donepezil (ARICEPT) 10 MG tablet Take 1 tablet (10 mg total) by mouth at bedtime. 05/06/18  Yes Dennie Bible, NP  FLUOCINOLONE ACETONIDE SCALP 0.01 % OIL Apply 1 application topically at bedtime. Apply to scalp 04/28/15  Yes [provider]  fluticasone (CUTIVATE) 0.05 % cream Apply 1 application topically 2 (two) times daily as needed (irritation). Apply to face 04/19/15  Yes [provider]  fluticasone (FLONASE) 50 MCG/ACT nasal spray Place 1 spray into both nostrils daily. Patient taking differently: Place 1 spray into both nostrils daily as needed for allergies.  10/08/14  Yes Mikhail, Velta Addison, DO  ketoconazole (NIZORAL) 2 % shampoo Apply 1 application topically 2 (two) times a week. Patient taking differently: Apply 1 application topically at bedtime.  03/31/15  Yes Baxley, Cresenciano Lick, MD  levothyroxine (SYNTHROID, LEVOTHROID) 50 MCG tablet Take 1 tablet (50 mcg total) by mouth daily. 08/06/14  Yes Baxley, Cresenciano Lick, MD  liraglutide (VICTOZA)  18 MG/3ML SOPN Inject 0.1 mLs (0.6 mg total) into the skin daily. Patient taking differently: Inject 0.6 mg into the skin at bedtime.  05/27/18  Yes Elayne Snare, MD  loratadine (CLARITIN) 10 MG tablet Take 10 mg by mouth daily as needed for allergies.    Yes [provider]  methocarbamol (ROBAXIN) 500 MG tablet Take 1 tablet (500 mg total) by mouth at bedtime as needed for muscle spasms. 01/02/18  Yes Tanna Furry, MD  mupirocin ointment (BACTROBAN) 2 % Use as directed to affected areas Patient taking differently: Apply 1 application topically 3 (three) times daily as needed (to affected areas).  09/04/16  Yes Baxley, Cresenciano Lick, MD  Naftifine HCl (NAFTIN) 2 % CREA Apply to feet daily for fungal infection Patient taking differently: Apply 1  application topically daily as needed (for fungal infection). Apply to feet 09/12/15  Yes Baxley, Cresenciano Lick, MD  nystatin-triamcinolone [facility]) cream Apply 1 application topically 2 (two) times daily. 09/26/17  Yes Baxley, Cresenciano Lick, MD  omeprazole (PRILOSEC) 40 MG capsule Take 1 capsule (40 mg total) by mouth daily. 03/13/18  Yes Esterwood, Amy S, PA-C  RENVELA 800 MG tablet Take 800 mg by mouth daily with supper.  07/27/14  Yes [provider]  simvastatin (ZOCOR) 20 MG tablet Take 20 mg by mouth at bedtime.    Yes [provider]  Blood Glucose Monitoring Suppl (FREESTYLE FREEDOM LITE) w/Device KIT Use to check blood sugar 2 times per day dx code E11.65 03/08/16   Elayne Snare, MD  docusate sodium (COLACE) 100 MG capsule Take 1 capsule (100 mg total) by mouth daily. To avoid constipation from pain medication Patient not taking: Reported on 06/01/2018 01/02/18   Tanna Furry, MD  glucose blood (FREESTYLE LITE) test strip Use as instructed to check blood sugar 3 times daily. 04/22/18   Elayne Snare, MD  glucose blood (ONETOUCH VERIO) test strip Use to check blood sugar 2 times per day. Dx code E11.65 03/01/16   Elayne Snare, MD  Insulin Pen Needle 31G X 5 MM  MISC Use with Victoza pen once a day 04/22/18   Elayne Snare, MD  Lancets (FREESTYLE) lancets Use as instructed to check blood sugar 2 times per day dx code E11.65 02/22/16   Elayne Snare, MD  TRADJENTA 5 MG TABS tablet TAKE 1 TABLET(5 MG) BY MOUTH DAILY 04/21/18   Elayne Snare, MD     Results for orders placed or performed during the hospital encounter of 06/01/18 (from the past 48 hour(s))  Comprehensive metabolic panel     Status: Abnormal   Collection Time: 06/01/18  1:58 PM  Result Value Ref Range   Sodium 142 135 - 145 mmol/L   Potassium 4.6 3.5 - 5.1 mmol/L   Chloride 101 98 - 111 mmol/L   CO2 25 22 - 32 mmol/L   Glucose, Bld 102 (H) 70 - 99 mg/dL   BUN 31 (H) 8 - 23 mg/dL   Creatinine, Ser 5.62 (H) 0.61 - 1.24 mg/dL   Calcium 9.1 8.9 - 10.3 mg/dL   Total Protein 5.0 (L) 6.5 - 8.1 g/dL   Albumin 2.6 (L) 3.5 - 5.0 g/dL   AST 26 15 - 41 U/L   ALT 12 0 - 44 U/L   Alkaline Phosphatase 75 38 - 126 U/L   Total Bilirubin 0.6 0.3 - 1.2 mg/dL   GFR calc non Af Amer 9 (L) >60 mL/min   GFR calc Af Amer 10 (L) >60 mL/min    Comment: (NOTE) The eGFR has been calculated using the CKD EPI equation. This calculation has not been validated in all clinical situations. eGFR's persistently <60 mL/min signify possible Chronic Kidney Disease.    Anion gap 16 (H) 5 - 15    Comment: Performed at [facility], [place] [address], [place], [place] [address]  Lipase, blood     Status: Abnormal   Collection Time: 06/01/18  1:58 PM  Result Value Ref Range   Lipase 70 (H) 11 - 51 U/L    Comment: Performed at [facility] [address] [place], [place] [address]  CBC WITH DIFFERENTIAL     Status: Abnormal   Collection Time: 06/01/18  1:58 PM  Result Value Ref Range   WBC 10.0 4.0 -  10.5 K/uL   RBC 1.71 (L) 4.22 - 5.81 MIL/uL   Hemoglobin 5.8 (LL) 13.0 - 17.0 g/dL    Comment: REPEATED TO VERIFY CRITICAL RESULT CALLED TO, READ BACK BY AND VERIFIED WITH: H HALL,RN 1425 06/01/18 D BRADLEY     HCT 18.7 (L) 39.0 - 52.0 %   MCV 109.4 (H) 78.0 - 100.0 fL   MCH 33.9 26.0 - 34.0 pg   MCHC 31.0 30.0 - 36.0 g/dL   RDW 21.3 (H) 11.5 - 15.5 %   Platelets 223 150 - 400 K/uL   Neutrophils Relative % 69 %   Lymphocytes Relative 20 %   Monocytes Relative 7 %   Eosinophils Relative 3 %   Basophils Relative 1 %   Neutro Abs 6.9 1.7 - 7.7 K/uL   Lymphs Abs 2.0 0.7 - 4.0 K/uL   Monocytes Absolute 0.7 0.1 - 1.0 K/uL   Eosinophils Absolute 0.3 0.0 - 0.7 K/uL   Basophils Absolute 0.1 0.0 - 0.1 K/uL   RBC Morphology POLYCHROMASIA PRESENT     Comment: [facility] Performed at [facility] [address] [place], [place] [address]   Hemoglobin A1c     Status: None   Collection Time: 06/01/18  1:58 PM  Result Value Ref Range   Hgb A1c MFr Bld 5.2 4.8 - 5.6 %    Comment: (NOTE) Pre diabetes:          5.7%-6.4% Diabetes:              >6.4% Glycemic control for   <7.0% adults with diabetes    Mean Plasma Glucose 102.54 mg/dL    Comment: Performed at [facility] [address], [place], [place] [address]  Type and screen [facility]     Status: None   Collection Time: 06/01/18  2:01 PM  Result Value Ref Range   ABO/RH(D) O POS    Antibody Screen NEG    Sample Expiration 06/04/2018    Unit Number [id number]    Blood Component Type RED CELLS,LR    Unit division 00    Status of Unit ISSUED,FINAL    Transfusion Status OK TO TRANSFUSE    Crossmatch Result      Compatible Performed at [facility], [place] [address], [place], [place] [address]    Unit Number [id number]    Blood Component Type RED CELLS,LR    Unit division 00    Status of Unit ISSUED,FINAL    Transfusion Status OK TO TRANSFUSE    Crossmatch Result Compatible   POC occult blood, ED     Status: Abnormal   Collection Time: 06/01/18  3:02 PM  Result Value Ref Range   Fecal Occult Bld POSITIVE (A) NEGATIVE  Prepare RBC     Status: None   Collection Time: 06/01/18  3:28 PM   Result Value Ref Range   Order Confirmation      ORDER PROCESSED BY BLOOD BANK Performed at [facility], [place] [address] [place], [place] [address]   Glucose, capillary     Status: Abnormal   Collection Time: 06/01/18  4:42 PM  Result Value Ref Range   Glucose-Capillary 58 (L) 70 - 99 mg/dL  Glucose, capillary     Status: Abnormal   Collection Time: 06/01/18  6:04 PM  Result Value Ref Range   Glucose-Capillary 128 (H) 70 - 99 mg/dL  Glucose, capillary     Status: None   Collection  Time: 06/01/18  8:41 PM  Result Value Ref Range   Glucose-Capillary 78 70 - 99 mg/dL  Glucose, capillary     Status: None   Collection Time: 06/02/18 12:09 AM  Result Value Ref Range   Glucose-Capillary 80 70 - 99 mg/dL  Glucose, capillary     Status: None   Collection Time: 06/02/18  4:17 AM  Result Value Ref Range   Glucose-Capillary 71 70 - 99 mg/dL  Comprehensive metabolic panel     Status: Abnormal   Collection Time: 06/02/18  7:25 AM  Result Value Ref Range   Sodium 141 135 - 145 mmol/L   Potassium 4.7 3.5 - 5.1 mmol/L   Chloride 104 98 - 111 mmol/L   CO2 24 22 - 32 mmol/L   Glucose, Bld 77 70 - 99 mg/dL   BUN 34 (H) 8 - 23 mg/dL   Creatinine, Ser 6.68 (H) 0.61 - 1.24 mg/dL   Calcium 8.7 (L) 8.9 - 10.3 mg/dL   Total Protein 5.1 (L) 6.5 - 8.1 g/dL   Albumin 2.5 (L) 3.5 - 5.0 g/dL   AST 23 15 - 41 U/L   ALT 10 0 - 44 U/L   Alkaline Phosphatase 59 38 - 126 U/L   Total Bilirubin 0.6 0.3 - 1.2 mg/dL   GFR calc non Af Amer 7 (L) >60 mL/min   GFR calc Af Amer 8 (L) >60 mL/min    Comment: (NOTE) The eGFR has been calculated using the CKD EPI equation. This calculation has not been validated in all clinical situations. eGFR's persistently <60 mL/min signify possible Chronic Kidney Disease.    Anion gap 13 5 - 15    Comment: Performed at [facility] [address] [place], [place] [address]  CBC     Status: Abnormal   Collection Time: 06/02/18  7:25 AM  Result Value Ref  Range   WBC 8.0 4.0 - 10.5 K/uL   RBC 2.70 (L) 4.22 - 5.81 MIL/uL   Hemoglobin 8.6 (L) 13.0 - 17.0 g/dL    Comment: POST TRANSFUSION SPECIMEN   HCT 26.2 (L) 39.0 - 52.0 %   MCV 97.0 78.0 - 100.0 fL    Comment: POST TRANSFUSION SPECIMEN   MCH 31.9 26.0 - 34.0 pg   MCHC 32.8 30.0 - 36.0 g/dL   RDW 21.7 (H) 11.5 - 15.5 %   Platelets 208 150 - 400 K/uL    Comment: Performed at [facility], [place] [address], [place], [place] [address]  Glucose, capillary     Status: None   Collection Time: 06/02/18  7:38 AM  Result Value Ref Range   Glucose-Capillary 82 70 - 99 mg/dL    ROS: see hpi     Physical Exam: Vitals:   06/02/18 0519 06/02/18 0739  BP: (!) 142/58 (!) 155/50  Pulse: 74 71  Resp: 18 (!) 21  Temp: 98 F (36.7 C)   SpO2: 100% 100%     General: alert , NAD  Elderly Male calm , OX3, thin but WD WN  HEENT: [place] , non icteric , eomi. Fundi benign Neck: supple n jvd PCL Heart: RRR 2/6sem lsb,  Lungs: CTA bilat non labored breathing Abdomen= BS +, soft Tender Left upper quad., healed mid line scar(ho vascular bypass) bruise Left lower abd Extremities: no pedal edema Bilat Fem bruits Skin: no pedal ulcers , no overt rash ,back with healing excoriations  Neuro: alert OX3, follows commands , moves all extrem  Dialysis  Access: POs bruit  RFA AVF  Dialysis Orders: [facility] . EDW 70.5 HD Bath 3k, 2.0 ca  Time 4hr Heparin 5000. Access RFA AVF     Calcitriol 2.4mg po /HD  (  OP pth [id number]<[id number]<3000) Mircera 100 mcg  Iv q 2wks  Last on 8/28 /19   Venofer  100 mg X10 has received X 3    OP Hgb 7.1  (9-04)  TFS 19%  05/21/18)          PTH (([phone]  Assessment/Plan 1. ESRD -  HD on MWF use no hep  2. GI bleed- GI seeing  3. Hypertension/volume  - volume okay, no op bp meds  4. Anemia of ESRD and Gi Bleed- Aranesp use Max with wed hd / continue Venofer load as was at op kid center  5. Metabolic bone disease -  Po cal. On hd , renvela binder when eating  6. DM  type 2  7. Hypothyroidism \ 8. HO L 1 compression fx (past surgery )  DErnest Haber PA-C [id number]/05/2018, 9:33 AM I have seen and examined this patient and agree with the plan of care seen , eval, examined,  Counseled, set up HD .  JJeneen RinksDeterding 06/02/2018, 2:29 PM

## 2018-06-02 NOTE — H&P (View-Only) (Signed)
[name]: 8:40 AM 06/02/2018  LOS: 1 day    Referring Provider: Dr Bonner Puna  Primary Care Physician:  Elby Showers, MD Primary Gastroenterologist:  Dr. Dr Silverio Decamp    Reason for Consultation:  Anemia, melena   HPI: [name] is a 77 y.o. male.  PMH ESRD on HD.  PVD, s/p aorto-bifem BPG 1992.  IDDM.  OSA.  HTN.  SBO due to adhesions in 1990s.  Macrocytic anemia.  Deg spine dz and compression fxs.  Hypothyroidism (TSH ok in 01/2018).      2004 Colonoscopy.  Screening study.  Diverticulosis.   2010 Colonoscopy for abd pain.  Scattered tics.   10/2017 Colonoscopy.  1 HP and 1 tubular adenoma polyp. Scattered mild diverticulosis.  Solitary erosion in transverse colon (no biopsy).  Non-bleeding int rrhoids.   10/2017 EGD.  MD dilated a mild esophageal stricture.  Patchy, mild gastritis.  Biopsies negative for HP.   01/23/18 CT ab/pelvis.  No acute abnormality.  Bil renal atrophy.  Extensive athersclerosis. Colonic diverticulosis.  S/p aorto-bifem.  Stable compression fx.   03/2018 GES.  Somewhat unusually rapid emptying of radiotracer from the stomach. Significance of this finding uncertain. No findings suggesting delayed gastric emptying.  In last several months GI issues included long standing anorexia, abdominal discomfort and cramping, occasional diarrhea.   His renal MD, Dr Augustin Coupe has referred pt to [facility] (not set up yet) for the abnormal GES.  BMI 26 >> 22,  Wt 88.5 >> 71.4 kg in last 8 months Home meds include 325 ASA, Omeprazole 40 mg, Miralax.  Starting 02/25/18 Miralax controlled loose, brown stools.  Recurrent loose, black stool started ~ 2 weeks ago, Miralax did not seem to help (hard to say as pt is profuse but poor historian).  Simultaneously episodes of clear, mucoid rectal discharge.  No abdominal  pain, just mild TTP.     Anorexia better, eating more.  No nausea.  No ETOH, NSAIDs.  In last week or more, due to low Iron sat, Venofer infusions increased from every few weeks to 3 x weekly during HD sessions.     Weakness ~ 3 days.  Low Hgb at HD on Saturday. Advised to proceed to ED.  Notes say pt c/o abd pain but pt denies this currently.   Baseline Hgb ~ 8.5 - 9.5, four months ago. MCV 104.   5.8 at arrival yest >> 8.6 after 2 U PRBC.   MCV 109.   Lipase 70 but LFTs normal, though albumin low at 2.6.    CT ab/pelvis with contrast.  Progression of L1 compression fx.  Cystic, atrophic kidneys.  Liver cysts.  Fatty liver.  Colonic tics.  S/p Ao bypass.      Past Medical History:  Diagnosis Date  . Allergy   . Anemia   . Arthritis   . Cataract    bil cateracts removed  . Coronary artery disease   . Diabetes mellitus    Type 2  . Diverticulitis   . ED (erectile dysfunction)   . 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 ESRD (end stage renal disease) on dialysis ([place]) 03/2015  . GERD (gastroesophageal reflux disease)    pepto   . Gout   . Hyperlipidemia   . Hypertension   . Hypothyroidism   . Pneumonia   . PVD (peripheral vascular disease) ([place])    has plastic aorta  . Renal insufficiency   . Seasonal allergies   . Shortness of breath dyspnea   . Sleep apnea    does not wear c-pap  . Thyroid disease     Past Surgical History:  Procedure Laterality Date  . aortobifemoral bypass    . AV FISTULA PLACEMENT Left 12/01/2013   Procedure: ARTERIOVENOUS (AV) FISTULA CREATION- LEFT BRACHIOCEPHALIC;  Surgeon: Angelia Mould, MD;  Location: [place];  Service: Vascular;  Laterality: Left;  . [place] TRANSPOSITION Right 07/27/2014   Procedure: BASCILIC VEIN TRANSPOSITION;  Surgeon: Angelia Mould, MD;  Location: [place];  Service: Vascular;  Laterality: Right;  . BREAST SURGERY     left - granulomatous mastitis  . COLONOSCOPY    . 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 EYE SURGERY Bilateral    cataracts  . HEMODIALYSIS INPATIENT  01/17/2018      . REVISON OF ARTERIOVENOUS FISTULA Left 02/09/2014   Procedure: REVISON OF LEFT ARTERIOVENOUS FISTULA - RESECTION OF RENDUNDANT VEIN;  Surgeon: Angelia Mould, MD;  Location: [place];  Service: Vascular;  Laterality: Left;  . SBO with lysis adhesions    . SHUNTOGRAM Left 04/19/2014   Procedure: FISTULOGRAM;  Surgeon: Angelia Mould, MD;  Location: [facility] CATH LAB;  Service: Cardiovascular;  Laterality: Left;  . UNILATERAL UPPER EXTREMEITY ANGIOGRAM N/A 07/12/2014   Procedure: UNILATERAL UPPER Anselmo Rod;  Surgeon: Angelia Mould, MD;  Location[facility] C[facility] LAB;  Service: Cardiovascular;  Laterality: N/A;    Prior to Admission medications   Medication Sig Start Date End Date Taking? Authorizing Provider  acetaminophen (TYLENOL) 500 MG tablet Take 1,000 mg by mouth 2 (two) times daily as needed (for pain).    Yes [provider]  allopurinol (ZYLOPRIM) 100 MG tablet Take 100 mg by mouth daily as needed (pain).    Yes [provider]  aspirin 325 MG tablet Take 325 mg by mouth daily.    Yes [provider]  Carboxymethylcellulose Sod PF 0.25 % SOLN Place 1 drop into both eyes as needed (dry eyes).   Yes [provider]  clobetasol (TEMOVATE) 0.05 % external solution Apply 1 application topically 2 (two) times daily as needed (irritation). Apply to scalp 04/19/15  Yes [provider]  denosumab (PROLIA) 60 MG/ML SOSY injection Inject 60 mg into the skin every 6 (six) months.   Yes [provider]  donepezil (ARICEPT) 10 MG tablet Take 1 tablet (10 mg total) by mouth at bedtime. 05/06/18  Yes Dennie Bible, NP  FLUOCINOLONE ACETONIDE SCALP 0.01 % OIL Apply 1 application topically at bedtime. Apply to scalp 04/28/15  Yes [provider]  fluticasone (CUTIVATE) 0.05 % cream Apply 1 application topically 2 (two) times daily as  needed (irritation). Apply to face 04/19/15  Yes [provider]  fluticasone (FLONASE) 50 MCG/ACT nasal spray Place 1 spray into both nostrils daily. Patient taking differently: Place 1 spray into both nostrils daily as needed for allergies.  10/08/14  Yes Mikhail, Velta Addison, DO  ketoconazole (NIZORAL) 2 % shampoo Apply 1 application topically 2 (two) times a week.  Patient taking differently: Apply 1 application topically at bedtime.  03/31/15  Yes Baxley, Cresenciano Lick, MD  levothyroxine (SYNTHROID, LEVOTHROID) 50 MCG tablet Take 1 tablet (50 mcg total) by mouth daily. 08/06/14  Yes Baxley, Cresenciano Lick, MD  liraglutide (VICTOZA) 18 MG/3ML SOPN Inject 0.1 mLs (0.6 mg total) into the skin daily. Patient taking differently: Inject 0.6 mg into the skin at bedtime.  05/27/18  Yes Elayne Snare, MD  loratadine (CLARITIN) 10 MG tablet Take 10 mg by mouth daily as needed for allergies.    Yes [provider]  methocarbamol (ROBAXIN) 500 MG tablet Take 1 tablet (500 mg total) by mouth at bedtime as needed for muscle spasms. 01/02/18  Yes Tanna Furry, MD  mupirocin ointment (BACTROBAN) 2 % Use as directed to affected areas Patient taking differently: Apply 1 application topically 3 (three) times daily as needed (to affected areas).  09/04/16  Yes Baxley, Cresenciano Lick, MD  Naftifine HCl (NAFTIN) 2 % CREA Apply to feet daily for fungal infection Patient taking differently: Apply 1 application topically daily as needed (for fungal infection). Apply to feet 09/12/15  Yes Baxley, Cresenciano Lick, MD  nystatin-triamcinolone [facility] II) cream Apply 1 application topically 2 (two) times daily. 09/26/17  Yes Baxley, Cresenciano Lick, MD  omeprazole (PRILOSEC) 40 MG capsule Take 1 capsule (40 mg total) by mouth daily. 03/13/18  Yes Esterwood, Amy S, PA-C  RENVELA 800 MG tablet Take 800 mg by mouth daily with supper.  07/27/14  Yes [provider]  simvastatin (ZOCOR) 20 MG tablet Take 20 mg by mouth at bedtime.    Yes [provider]  Blood Glucose Monitoring Suppl (FREESTYLE FREEDOM LITE) w/Device KIT Use to check blood sugar 2 times per day dx code E11.65 03/08/16   Elayne Snare, MD  docusate sodium (COLACE) 100 MG capsule Take 1 capsule (100 mg total) by mouth daily. To avoid constipation from pain medication Patient not taking: Reported on 06/01/2018 01/02/18   Tanna Furry, MD  glucose blood (FREESTYLE LITE) test strip Use as instructed to check blood sugar 3 times daily. 04/22/18   Elayne Snare, MD  glucose blood (ONETOUCH VERIO) test strip Use to check blood sugar 2 times per day. Dx code E11.65 03/01/16   Elayne Snare, MD  Insulin Pen Needle 31G X 5 MM MISC Use with Victoza pen once a day 04/22/18   Elayne Snare, MD  Lancets (FREESTYLE) lancets Use as instructed to check blood sugar 2 times per day dx code E11.65 02/22/16   Elayne Snare, MD  TRADJENTA 5 MG TABS tablet TAKE 1 TABLET(5 MG) BY MOUTH DAILY 04/21/18   Elayne Snare, MD    Scheduled Meds: . donepezil  10 mg Oral QHS  . Fluocinolone Acetonide Scalp  1 application Topical QHS  . fluticasone  1 spray Each Nare Daily  . insulin aspart  0-9 Units Subcutaneous Q4H  . levothyroxine  50 mcg Oral QAC breakfast  . [START ON 06/05/2018] pantoprazole  40 mg Intravenous Q12H  . sevelamer carbonate  800 mg Oral Q supper  . simvastatin  20 mg Oral QHS   Infusions: . sodium chloride     PRN Meds: clobetasol, loratadine, methocarbamol, mupirocin ointment, Naftifine HCl, ondansetron **OR** ondansetron (ZOFRAN) IV, polyvinyl alcohol   Allergies as of 06/01/2018 - Review Complete 06/01/2018  Allergen Reaction Noted  . Penicillins Rash     Family History  Problem Relation Age of Onset  . Aneurysm Mother   . Heart disease Father   .  Stroke Father   . Hypertension Father   . Diabetes Father   . Dementia Neg Hx   . Colon cancer Neg Hx   . Esophageal cancer Neg Hx   . Pancreatic cancer Neg Hx   . Prostate cancer Neg Hx   . Rectal cancer Neg Hx   . Stomach cancer Neg Hx       Social History   Socioeconomic History  . Marital status: Married    Spouse name: [name] Mood  . Number of children: 1  . Years of education: 27  . Highest education level: Not on file  Occupational History  . Occupation: Retired  Scientific laboratory technician  . Financial resource strain: Not on file  . Food insecurity:    Worry: Not on file    Inability: Not on file  . Transportation needs:    Medical: No    Non-medical: No  Tobacco Use  . Smoking status: Former Smoker    Last attempt to quit: 09/24/1994    Years since quitting: 23.7  . Smokeless tobacco: Never Used  Substance and Sexual Activity  . Alcohol use: No    Alcohol/week: 0.0 standard drinks    Comment: Quit Oct. 1977 ("somewhat heavy")  . Drug use: No  . Sexual activity: Not on file  Lifestyle  . Physical activity:    Days per week: 3 days    Minutes per session: 10 min  . Stress: Not at all  Relationships  . Social connections:    Talks on phone: Not on file    Gets together: Not on file    Attends religious service: Not on file    Active member of club or organization: Not on file    Attends meetings of clubs or organizations: Not on file    Relationship status: Not on file  . Intimate partner violence:    Fear of current or ex partner: Not on file    Emotionally abused: Not on file    Physically abused: Not on file    Forced sexual activity: Not on file  Other Topics Concern  . Not on file  Social History Narrative   Lives at home with wife.   Caffeine use: Drinks no soda or tea.    Drinks 1 cup coffee/week   Right handed    REVIEW OF SYSTEMS: Constitutional:  weakness ENT:  No nose bleeds Pulm:  No SOB or cough CV:  No palpitations, no LE edema.  GU:  Scant urine ouotput.   GI:  Per HPI.  No dysphagia Heme:  Per HPI.  No unusual bleeding or bruising.     Transfusions:  No recalll of prior transfusions.   Neuro:  No headaches, no peripheral tingling or numbness.  No dizziness.   Derm:  Itching on left  arm  Endocrine:  No sweats or chills.  No polyuria or dysuria Immunization:  Not queried.  Reviewed.  Travel:  None beyond local counties in last few months.    PHYSICAL EXAM: Vital signs in last 24 hours: Vitals:   06/02/18 0519 06/02/18 0739  BP: (!) 142/58 (!) 155/50  Pulse: 74 71  Resp: 18 (!) 21  Temp: 98 F (36.7 C)   SpO2: 100% 100%   Wt Readings from Last 3 Encounters:  06/01/18 71.4 kg  05/06/18 70.3 kg  04/22/18 71.2 kg    General: talkative, alert, comfortable, not chronically or acutely ill looking Head:  No asymmetry, signs of trauma, swelling.    Eyes:  No scleral icterus.  EOMI.   Ears:  Not HOH.    Nose:  No congestion or discharge.   Mouth:  Moist, clear, pink oral MM Neck:  No JVD, masses, TMG, bruits Lungs:  Clear bil.  Good breath sounds.  No cough Heart: RRR.  No MRG.  S1, S2 present Abdomen:  Soft, BS hypoactive but normal timbre.  ND.  Long, prominent, well -healed, midline scar (from vascular bypass).  Slight, diffuse, tenderness in all quadrants, slightly > on left.  ~ 10 cm bruise on left lower abdomen.   Rectal: Dark, brown stool is FOBT +, no rectal masses or tenderness.     Musc/Skeltl: no joint redness, swelling or contractures Extremities:  No CCE.  Pulse palpable in old AVG on left arm.  New AVG on right is bandaged Neurologic:  Oriented x 3. Moves all 4s Skin:  No rash.  Healed sores on upper back.   Tattoos:  None seen.   Nodes:  No cervical adenopathy.     Psych:  Pleasant. Talkative.  Cooperative.    Intake/Output from previous day: 09/08 0701 - 09/09 0700 In: 1194 [I.V.:300; Blood:894] Out: 0  Intake/Output this shift: No intake/output data recorded.  LAB RESULTS: Recent Labs    06/01/18 1358 06/02/18 0725  WBC 10.0 8.0  HGB 5.8* 8.6*  HCT 18.7* 26.2*  PLT 223 208   BMET Lab Results  Component Value Date   NA 142 06/01/2018   NA 140 01/23/2018   NA 137 01/18/2018   K 4.6 06/01/2018   K 4.1 01/23/2018   K 4.0  01/18/2018   CL 101 06/01/2018   CL 96 (L) 01/23/2018   CL 95 (L) 01/18/2018   CO2 25 06/01/2018   CO2 31 01/23/2018   CO2 32 01/18/2018   GLUCOSE 102 (H) 06/01/2018   GLUCOSE 123 (H) 04/17/2018   GLUCOSE 151 (H) 02/20/2018   BUN 31 (H) 06/01/2018   BUN 12 01/23/2018   BUN 9 01/18/2018   CREATININE 5.62 (H) 06/01/2018   CREATININE 4.48 (H) 01/23/2018   CREATININE 4.27 (H) 01/18/2018   CALCIUM 9.1 06/01/2018   CALCIUM 7.9 (L) 04/17/2018   CALCIUM 8.8 (L) 01/23/2018   LFT Recent Labs    06/01/18 1358  PROT 5.0*  ALBUMIN 2.6*  AST 26  ALT 12  ALKPHOS 75  BILITOT 0.6   PT/INR Lab Results  Component Value Date   INR 1.01 06/05/2016   Hepatitis Panel No results for input(s): HEPBSAG, HCVAB, HEPAIGM, HEPBIGM in the last 72 hours. C-Diff No components found for: CDIFF Lipase     Component Value Date/Time   LIPASE 70 (H) 06/01/2018 1358    Drugs of Abuse  No results found for: LABOPIA, COCAINSCRNUR, LABBENZ, AMPHETMU, THCU, LABBARB   RADIOLOGY STUDIES: Ct Abdomen Pelvis W Contrast  Result Date: 06/01/2018 CLINICAL DATA:  Pain on the left.  Dialysis patient. EXAM: CT ABDOMEN AND PELVIS WITH CONTRAST TECHNIQUE: Multidetector CT imaging of the abdomen and pelvis was performed using the standard protocol following bolus administration of intravenous contrast. CONTRAST:  183m ISOVUE-300 IOPAMIDOL (ISOVUE-300) INJECTION 61% COMPARISON:  Jan 23, 2018 FINDINGS: Lower chest: No acute abnormality. Hepatobiliary: Multiple tiny low-attenuation lesions in the liver too small to characterize but almost certainly cysts. No solid masses are noted. Mild hepatic steatosis. The portal vein is patent. The gallbladder is unremarkable. Pancreas: Unremarkable. No pancreatic ductal dilatation or surrounding inflammatory changes. Spleen: Normal in size without focal abnormality. Adrenals/Urinary Tract: Adrenal glands are normal.  Kidneys are atrophic with numerous bilateral cysts. No hydronephrosis  or stones. No suspicious solid masses. The ureters are normal. The bladder is decompressed limiting evaluation. There is mild increased attenuation in the fat adjacent to the bladder. Stomach/Bowel: The stomach and small bowel are normal. Colonic diverticulosis is seen without diverticulitis. The remainder of the colon is normal. The appendix is unremarkable. Vascular/Lymphatic: The patient is status post aorta aortic bypass. Atherosclerotic changes are seen in the native aorta. Atherosclerotic changes are seen at the origin of the SMA and renal arteries, similar in the interval. No aneurysm or dissection. Atherosclerotic changes extend into the femoral and iliac vessels. No adenopathy. Reproductive: Prostate is unremarkable. Other: No free air or free fluid. Musculoskeletal: Patient is status post vertebroplasty of L3, stable. A compression fracture of L5 is stable. A compression fracture L2 is stable. The compression fracture of L1 has worsened in the interval with more loss of height. Mild wedging of T12 and T11 are stable. IMPRESSION: 1. The L1 compression fracture has worsened since May of 2019 with increased loss of height. Other multilevel compression fractures are stable. 2. The kidneys are atrophic with multiple cysts. No acute abnormalities. 3. Multiple probable cysts in the liver. 4. Hepatic steatosis. 5. Colonic diverticulosis without diverticulitis. 6. Aortic bypass.  Atherosclerosis. Electronically Signed   By: Dorise Bullion III M.D   On: 06/01/2018 14:55   Dg Chest Port 1 View  Result Date: 06/01/2018 CLINICAL DATA:  Acute LEFT LOWER chest/UPPER abdominal pain. EXAM: PORTABLE CHEST 1 VIEW COMPARISON:  01/12/2018 chest radiograph FINDINGS: The cardiomediastinal silhouette is unremarkable. There is no evidence of focal airspace disease, pulmonary edema, suspicious pulmonary nodule/mass, pleural effusion, or pneumothorax. No acute bony abnormalities are identified. No acute rib fracture identified.  IMPRESSION: No evidence of acute cardiopulmonary disease or acute rib fracture. Electronically Signed   By: Margarette Canada M.D.   On: 06/01/2018 23:16     IMPRESSION:   *  Acute on chronic macrocytic anemia in setting of tarry stools.   Colonoscopy, EGD 10/2017: adenomatous colon polyps, solitary colonic erosion;  esophageal stricture, mild gastritis.   Rapid emptying on 01/2018 GES.  Significance unclear but Nephrologist plans referral to [facility].   Previous GI complaints including diarrhea (improved with Miralax), anorexia (also improved in last several months).   No evidence of internal, RP bleeding on CT.  However has ASPVD so ? subclilnical gut ischemia?   *   ESRD.  HD MWF.  HD planned for this PM.    *   Elevated Lipase, normal LFTs.  No evidence biliary or pancreatic abnormalities on CT    PLAN:     *    D/w Dr Bryan Lemma.  Enteroscopy tomorrow [phone], as no time available for this afternoon.  Can feed pt today.    *  Switch to oral Protonix 40 mg daily.  Stop IV PPI.    *   CBC in AM.  Hgb this evening.  Folate, B 12 levels ordered to add on to prior collection.     Azucena Freed  06/02/2018, 8:41 AM Phone [phone]   Attending physician's note   I have reviewed the chart, labs, and rads and I agree with the Advanced Practitioner's note, impression, and recommendations. I discussed his clinical presentation at length with his wife as well.  He has been evaluated earlier this year with EGD and colonoscopy as noted above, but presents with what appears to be a slowly progressive macrocytic anemia with  admission hemoglobin of 5.8 (MCV/RDW 109/21), necessitating admission with RBC transfusion.  Agree with further evaluation of anemia with folate and B12, collected from pretransfusion labs.  We will plan on enteroscopy tomorrow to evaluate for UGI etiology for anemia.  Scheduled for hemodialysis today. Additional recommendations pending endoscopic findings.  If no source for bleeding  noted on enteroscopy, may consider VCE as outpatient for further small bowel interrogation.  [address] DO, FACG [phone] office

## 2018-06-02 NOTE — Progress Notes (Signed)
PROGRESS NOTE  [name]  JGG:[id number] DOB: 13-Nov-1940 DOA: 06/01/2018 PCP: Elby Showers, MD  Outpatient Specialists: GI, [place] Brief Narrative: [name] is a 77 y.o. male with a history of ESRD, T2DM, PVD s/p aortobifem bypass, and HTN. He reports melanotic stools for a couple weeks and noted progressive hgb drop in outpatient HD over the past month. Also notes loose stools, poor per oral intake, abdominal cramping and weight loss over the previous several months. Called and told to come to the ED a day prior but didn't come in until he noted left sided abdominal pain. Hgb found to be 5.8 with +FOBT. CT abdomen and pelvis noted diverticulosis without inflammation and incidental progression of one of his several lumbar compression fractures. 2u PRBCs given, started PPI, and admitted. GI consulted and recommending enteroscopy. Nephrology consulted for routine HD.  Assessment & Plan: Principal Problem:   GIB (gastrointestinal bleeding) Active Problems:   Insulin dependent diabetes mellitus (HCC)   Essential hypertension   End stage renal disease (HCC)   Occult GI bleeding  GI bleeding: Had colonoscopy and EGD this year showing mild diverticulosis, transverse colon erosion, a tubular adenoma, and a hyperplastic polyp as well as mild gastritis w/negative H. pylori Bx.  - Enteroscopy 9/10 per GI. NPO p MN. May need capsule endoscopy if no culprit found. - No indication for IV PPI, changed to po  Acute blood loss anemia on anemia of chronic disease, macrocytic anemia:  - s/p 2u PRBCs, check serial blood counts - B12, folate labs ordered as add on to pretransfusion labs.  - Recently increased venofer due to low Tsat, plan to maximize aranesp  ESRD:  - Routine HD per nephrology  Lumbar compression fractures with progression of L1 height loss, osteoporosis: No recent trauma and no symptoms attributable to this.  - Supportive care.  - Continue outpatient prolia  T2DM:  Well-controlled. HbA1c 5.2%.  - Sensitive SSI - Hold victoza, tradjenta.  PVD s/p aortobifem BPG 1992:  - Needs to continue ASA  Hypothyroidism: Last TSH 0.9 - Continue synthroid 68mcg  Hyperlipidemia:  - Continue statin  Loose stools: Worked up as outpatient with [facility] July 2019 showing rapid gastric emptying.  - [facility] referral pending as outpatient.   Cognitive impairment:  - Continue aricept.   DVT prophylaxis: SCDs Code Status: Full Family Communication: None at bedside this AM Disposition Plan: Uncertain  Consultants:   GI  Nephrology  Procedures:   Enteroscopy 9/10  Antimicrobials:  None   Subjective: No further dark stools this AM  Objective: Vitals:   06/02/18 1630 06/02/18 1700 06/02/18 1730 06/02/18 1754  BP: (!) 151/74 (!) 100/51 (!) 124/50 130/66  Pulse: 79 77 94 78  Resp:   (!) 24 20  Temp:   98.2 F (36.8 C) 98.6 F (37 C)  TempSrc:   Oral Oral  SpO2:      Weight:    71.1 kg  Height:        Intake/Output Summary (Last 24 hours) at 06/02/2018 1940 Last data filed at 06/02/2018 1754 Gross per 24 hour  Intake 894 ml  Output 831 ml  Net 63 ml   Filed Weights   06/01/18 1636 06/02/18 1400 06/02/18 1754  Weight: 71.4 kg 72.1 kg 71.1 kg    Gen: 76 y.o. male in no distress  Pulm: Non-labored breathing. Clear to auscultation bilaterally.  CV: Regular rate and rhythm. No murmur, rub, or gallop. No JVD, no pedal edema. GI: Abdomen soft, mildly  tender to point palpation in left quadrants. Non-distended, with + bowel sounds. No organomegaly or masses felt. Ext: Warm, no deformities. +thrill in AVG in arms bilaterally. Skin: Prominent midline abdominal scar, healed sores on upper back. Bilateral small irregular ecchymoses L > R at injection sites. No erythema. No other rashes, lesions, ulcers Neuro: Alert and oriented. No focal neurological deficits. Psych: Judgement and insight appear normal. Mood & affect appropriate.   Data Reviewed: I have  personally reviewed following labs and imaging studies  CBC: Recent Labs  Lab 06/01/18 1358 06/02/18 0725  WBC 10.0 8.0  NEUTROABS 6.9  --   HGB 5.8* 8.6*  HCT 18.7* 26.2*  MCV 109.4* 97.0  PLT 223 202   Basic Metabolic Panel: Recent Labs  Lab 06/01/18 1358 06/02/18 0725  NA 142 141  K 4.6 4.7  CL 101 104  CO2 25 24  GLUCOSE 102* 77  BUN 31* 34*  CREATININE 5.62* 6.68*  CALCIUM 9.1 8.7*   GFR: Estimated Creatinine Clearance: 9.5 mL/min (A) (by C-G formula based on SCr of 6.68 mg/dL (H)). Liver Function Tests: Recent Labs  Lab 06/01/18 1358 06/02/18 0725  AST 26 23  ALT 12 10  ALKPHOS 75 59  BILITOT 0.6 0.6  PROT 5.0* 5.1*  ALBUMIN 2.6* 2.5*   Recent Labs  Lab 06/01/18 1358  LIPASE 70*   No results for input(s): AMMONIA in the last 168 hours. Coagulation Profile: No results for input(s): INR, PROTIME in the last 168 hours. Cardiac Enzymes: No results for input(s): CKTOTAL, CKMB, CKMBINDEX, TROPONINI in the last 168 hours. BNP (last 3 results) No results for input(s): PROBNP in the last 8760 hours. HbA1C: Recent Labs    06/01/18 1358  HGBA1C 5.2   CBG: Recent Labs  Lab 06/02/18 0009 06/02/18 0417 06/02/18 0738 06/02/18 1143 06/02/18 1710  GLUCAP 80 71 82 77 143*   Lipid Profile: No results for input(s): CHOL, HDL, LDLCALC, TRIG, CHOLHDL, LDLDIRECT in the last 72 hours. Thyroid Function Tests: No results for input(s): TSH, T4TOTAL, FREET4, T3FREE, THYROIDAB in the last 72 hours. Anemia Panel: Recent Labs    06/01/18 1358  VITAMINB12 254   Urine analysis:    Component Value Date/Time   COLORURINE YELLOW 01/23/2018 2043   APPEARANCEUR HAZY (A) 01/23/2018 2043   LABSPEC 1.015 01/23/2018 2043   PHURINE 9.0 (H) 01/23/2018 2043   GLUCOSEU NEGATIVE 01/23/2018 2043   HGBUR NEGATIVE 01/23/2018 2043   BILIRUBINUR NEGATIVE 01/23/2018 2043   BILIRUBINUR neg 02/14/2015 [place] 01/23/2018 2043   PROTEINUR 100 (A) 01/23/2018  2043   UROBILINOGEN 0.2 02/20/2015 0944   NITRITE NEGATIVE 01/23/2018 2043   LEUKOCYTESUR TRACE (A) 01/23/2018 2043   Recent Results (from the past 240 hour(s))  MRSA PCR Screening     Status: None   Collection Time: 06/02/18  6:38 AM  Result Value Ref Range Status   MRSA by PCR NEGATIVE NEGATIVE Final    Comment:        The GeneXpert MRSA Assay (FDA approved for NASAL specimens only), is one component of a comprehensive MRSA colonization surveillance program. It is not intended to diagnose MRSA infection nor to guide or monitor treatment for MRSA infections. Performed at [facility], [place] [address], [place], [place] [address]       Radiology Studies: Ct Abdomen Pelvis W Contrast  Result Date: 06/01/2018 CLINICAL DATA:  Pain on the left.  Dialysis patient. EXAM: CT ABDOMEN AND PELVIS WITH CONTRAST TECHNIQUE: Multidetector  CT imaging of the abdomen and pelvis was performed using the standard protocol following bolus administration of intravenous contrast. CONTRAST:  164mL ISOVUE-300 IOPAMIDOL (ISOVUE-300) INJECTION 61% COMPARISON:  Jan 23, 2018 FINDINGS: Lower chest: No acute abnormality. Hepatobiliary: Multiple tiny low-attenuation lesions in the liver too small to characterize but almost certainly cysts. No solid masses are noted. Mild hepatic steatosis. The portal vein is patent. The gallbladder is unremarkable. Pancreas: Unremarkable. No pancreatic ductal dilatation or surrounding inflammatory changes. Spleen: Normal in size without focal abnormality. Adrenals/Urinary Tract: Adrenal glands are normal. Kidneys are atrophic with numerous bilateral cysts. No hydronephrosis or stones. No suspicious solid masses. The ureters are normal. The bladder is decompressed limiting evaluation. There is mild increased attenuation in the fat adjacent to the bladder. Stomach/Bowel: The stomach and small bowel are normal. Colonic diverticulosis is seen without diverticulitis. The remainder of the  colon is normal. The appendix is unremarkable. Vascular/Lymphatic: The patient is status post aorta aortic bypass. Atherosclerotic changes are seen in the native aorta. Atherosclerotic changes are seen at the origin of the SMA and renal arteries, similar in the interval. No aneurysm or dissection. Atherosclerotic changes extend into the femoral and iliac vessels. No adenopathy. Reproductive: Prostate is unremarkable. Other: No free air or free fluid. Musculoskeletal: Patient is status post vertebroplasty of L3, stable. A compression fracture of L5 is stable. A compression fracture L2 is stable. The compression fracture of L1 has worsened in the interval with more loss of height. Mild wedging of T12 and T11 are stable. IMPRESSION: 1. The L1 compression fracture has worsened since May of 2019 with increased loss of height. Other multilevel compression fractures are stable. 2. The kidneys are atrophic with multiple cysts. No acute abnormalities. 3. Multiple probable cysts in the liver. 4. Hepatic steatosis. 5. Colonic diverticulosis without diverticulitis. 6. Aortic bypass.  Atherosclerosis. Electronically Signed   By: Dorise Bullion III M.D   On: 06/01/2018 14:55   Dg Chest Port 1 View  Result Date: 06/01/2018 CLINICAL DATA:  Acute LEFT LOWER chest/UPPER abdominal pain. EXAM: PORTABLE CHEST 1 VIEW COMPARISON:  01/12/2018 chest radiograph FINDINGS: The cardiomediastinal silhouette is unremarkable. There is no evidence of focal airspace disease, pulmonary edema, suspicious pulmonary nodule/mass, pleural effusion, or pneumothorax. No acute bony abnormalities are identified. No acute rib fracture identified. IMPRESSION: No evidence of acute cardiopulmonary disease or acute rib fracture. Electronically Signed   By: Margarette Canada M.D.   On: 06/01/2018 23:16    Scheduled Meds: . calcitRIOL  2.25 mcg Oral Q M,W,F-HD  . donepezil  10 mg Oral QHS  . Fluocinolone Acetonide Scalp  1 application Topical QHS  . fluticasone   1 spray Each Nare Daily  . insulin aspart  0-9 Units Subcutaneous Q4H  . levothyroxine  50 mcg Oral QAC breakfast  . multivitamin  1 tablet Oral QHS  . pantoprazole  40 mg Oral Q0600  . sevelamer carbonate  800 mg Oral Q supper  . simvastatin  20 mg Oral QHS   Continuous Infusions: . sodium chloride    . ferric gluconate (FERRLECIT/NULECIT) IV 125 mg (06/02/18 1711)     LOS: 1 day   Time spent: 25 minutes.  Patrecia Pour, MD [facility] [email or web address] Password [email or web address] 06/02/2018, 7:40 PM

## 2018-06-02 NOTE — Consult Note (Addendum)
[name]: 8:40 AM 06/02/2018  LOS: 1 day    Referring Provider: Dr Bonner Puna  Primary Care Physician:  Elby Showers, MD Primary Gastroenterologist:  Dr. Dr Silverio Decamp    Reason for Consultation:  Anemia, melena   HPI: [name] is a 77 y.o. male.  PMH ESRD on HD.  PVD, s/p aorto-bifem BPG 1992.  IDDM.  OSA.  HTN.  SBO due to adhesions in 1990s.  Macrocytic anemia.  Deg spine dz and compression fxs.  Hypothyroidism (TSH ok in 01/2018).      2004 Colonoscopy.  Screening study.  Diverticulosis.   2010 Colonoscopy for abd pain.  Scattered tics.   10/2017 Colonoscopy.  1 HP and 1 tubular adenoma polyp. Scattered mild diverticulosis.  Solitary erosion in transverse colon (no biopsy).  Non-bleeding int rrhoids.   10/2017 EGD.  MD dilated a mild esophageal stricture.  Patchy, mild gastritis.  Biopsies negative for HP.   01/23/18 CT ab/pelvis.  No acute abnormality.  Bil renal atrophy.  Extensive athersclerosis. Colonic diverticulosis.  S/p aorto-bifem.  Stable compression fx.   03/2018 GES.  Somewhat unusually rapid emptying of radiotracer from the stomach. Significance of this finding uncertain. No findings suggesting delayed gastric emptying.  In last several months GI issues included long standing anorexia, abdominal discomfort and cramping, occasional diarrhea.   His renal MD, Dr Augustin Coupe has referred pt to [facility] (not set up yet) for the abnormal GES.  BMI 26 >> 22,  Wt 88.5 >> 71.4 kg in last 8 months Home meds include 325 ASA, Omeprazole 40 mg, Miralax.  Starting 02/25/18 Miralax controlled loose, brown stools.  Recurrent loose, black stool started ~ 2 weeks ago, Miralax did not seem to help (hard to say as pt is profuse but poor historian).  Simultaneously episodes of clear, mucoid rectal discharge.  No abdominal  pain, just mild TTP.     Anorexia better, eating more.  No nausea.  No ETOH, NSAIDs.  In last week or more, due to low Iron sat, Venofer infusions increased from every few weeks to 3 x weekly during HD sessions.     Weakness ~ 3 days.  Low Hgb at HD on Saturday. Advised to proceed to ED.  Notes say pt c/o abd pain but pt denies this currently.   Baseline Hgb ~ 8.5 - 9.5, four months ago. MCV 104.   5.8 at arrival yest >> 8.6 after 2 U PRBC.   MCV 109.   Lipase 70 but LFTs normal, though albumin low at 2.6.    CT ab/pelvis with contrast.  Progression of L1 compression fx.  Cystic, atrophic kidneys.  Liver cysts.  Fatty liver.  Colonic tics.  S/p Ao bypass.      Past Medical History:  Diagnosis Date  . Allergy   . Anemia   . Arthritis   . Cataract    bil cateracts removed  . Coronary artery disease   . Diabetes mellitus    Type 2  . Diverticulitis   . ED (erectile dysfunction)   . 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 ESRD (end stage renal disease) on dialysis ([place]) 03/2015  . GERD (gastroesophageal reflux disease)    pepto   . Gout   . Hyperlipidemia   . Hypertension   . Hypothyroidism   . Pneumonia   . PVD (peripheral vascular disease) ([place])    has plastic aorta  . Renal insufficiency   . Seasonal allergies   . Shortness of breath dyspnea   . Sleep apnea    does not wear c-pap  . Thyroid disease     Past Surgical History:  Procedure Laterality Date  . aortobifemoral bypass    . AV FISTULA PLACEMENT Left 12/01/2013   Procedure: ARTERIOVENOUS (AV) FISTULA CREATION- LEFT BRACHIOCEPHALIC;  Surgeon: Angelia Mould, MD;  Location: [place];  Service: Vascular;  Laterality: Left;  . [place] TRANSPOSITION Right 07/27/2014   Procedure: BASCILIC VEIN TRANSPOSITION;  Surgeon: Angelia Mould, MD;  Location: [place];  Service: Vascular;  Laterality: Right;  . BREAST SURGERY     left - granulomatous mastitis  . COLONOSCOPY    . 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 EYE SURGERY Bilateral    cataracts  . HEMODIALYSIS INPATIENT  01/17/2018      . REVISON OF ARTERIOVENOUS FISTULA Left 02/09/2014   Procedure: REVISON OF LEFT ARTERIOVENOUS FISTULA - RESECTION OF RENDUNDANT VEIN;  Surgeon: Angelia Mould, MD;  Location: [place];  Service: Vascular;  Laterality: Left;  . SBO with lysis adhesions    . SHUNTOGRAM Left 04/19/2014   Procedure: FISTULOGRAM;  Surgeon: Angelia Mould, MD;  Location: [facility] CATH LAB;  Service: Cardiovascular;  Laterality: Left;  . UNILATERAL UPPER EXTREMEITY ANGIOGRAM N/A 07/12/2014   Procedure: UNILATERAL UPPER Anselmo Rod;  Surgeon: Angelia Mould, MD;  Location[facility] C[facility] LAB;  Service: Cardiovascular;  Laterality: N/A;    Prior to Admission medications   Medication Sig Start Date End Date Taking? Authorizing Provider  acetaminophen (TYLENOL) 500 MG tablet Take 1,000 mg by mouth 2 (two) times daily as needed (for pain).    Yes [provider]  allopurinol (ZYLOPRIM) 100 MG tablet Take 100 mg by mouth daily as needed (pain).    Yes [provider]  aspirin 325 MG tablet Take 325 mg by mouth daily.    Yes [provider]  Carboxymethylcellulose Sod PF 0.25 % SOLN Place 1 drop into both eyes as needed (dry eyes).   Yes [provider]  clobetasol (TEMOVATE) 0.05 % external solution Apply 1 application topically 2 (two) times daily as needed (irritation). Apply to scalp 04/19/15  Yes [provider]  denosumab (PROLIA) 60 MG/ML SOSY injection Inject 60 mg into the skin every 6 (six) months.   Yes [provider]  donepezil (ARICEPT) 10 MG tablet Take 1 tablet (10 mg total) by mouth at bedtime. 05/06/18  Yes Dennie Bible, NP  FLUOCINOLONE ACETONIDE SCALP 0.01 % OIL Apply 1 application topically at bedtime. Apply to scalp 04/28/15  Yes [provider]  fluticasone (CUTIVATE) 0.05 % cream Apply 1 application topically 2 (two) times daily as  needed (irritation). Apply to face 04/19/15  Yes [provider]  fluticasone (FLONASE) 50 MCG/ACT nasal spray Place 1 spray into both nostrils daily. Patient taking differently: Place 1 spray into both nostrils daily as needed for allergies.  10/08/14  Yes Mikhail, Velta Addison, DO  ketoconazole (NIZORAL) 2 % shampoo Apply 1 application topically 2 (two) times a week.  Patient taking differently: Apply 1 application topically at bedtime.  03/31/15  Yes Baxley, Cresenciano Lick, MD  levothyroxine (SYNTHROID, LEVOTHROID) 50 MCG tablet Take 1 tablet (50 mcg total) by mouth daily. 08/06/14  Yes Baxley, Cresenciano Lick, MD  liraglutide (VICTOZA) 18 MG/3ML SOPN Inject 0.1 mLs (0.6 mg total) into the skin daily. Patient taking differently: Inject 0.6 mg into the skin at bedtime.  05/27/18  Yes Elayne Snare, MD  loratadine (CLARITIN) 10 MG tablet Take 10 mg by mouth daily as needed for allergies.    Yes [provider]  methocarbamol (ROBAXIN) 500 MG tablet Take 1 tablet (500 mg total) by mouth at bedtime as needed for muscle spasms. 01/02/18  Yes Tanna Furry, MD  mupirocin ointment (BACTROBAN) 2 % Use as directed to affected areas Patient taking differently: Apply 1 application topically 3 (three) times daily as needed (to affected areas).  09/04/16  Yes Baxley, Cresenciano Lick, MD  Naftifine HCl (NAFTIN) 2 % CREA Apply to feet daily for fungal infection Patient taking differently: Apply 1 application topically daily as needed (for fungal infection). Apply to feet 09/12/15  Yes Baxley, Cresenciano Lick, MD  nystatin-triamcinolone [facility] II) cream Apply 1 application topically 2 (two) times daily. 09/26/17  Yes Baxley, Cresenciano Lick, MD  omeprazole (PRILOSEC) 40 MG capsule Take 1 capsule (40 mg total) by mouth daily. 03/13/18  Yes Esterwood, Amy S, PA-C  RENVELA 800 MG tablet Take 800 mg by mouth daily with supper.  07/27/14  Yes [provider]  simvastatin (ZOCOR) 20 MG tablet Take 20 mg by mouth at bedtime.    Yes [provider]  Blood Glucose Monitoring Suppl (FREESTYLE FREEDOM LITE) w/Device KIT Use to check blood sugar 2 times per day dx code E11.65 03/08/16   Elayne Snare, MD  docusate sodium (COLACE) 100 MG capsule Take 1 capsule (100 mg total) by mouth daily. To avoid constipation from pain medication Patient not taking: Reported on 06/01/2018 01/02/18   Tanna Furry, MD  glucose blood (FREESTYLE LITE) test strip Use as instructed to check blood sugar 3 times daily. 04/22/18   Elayne Snare, MD  glucose blood (ONETOUCH VERIO) test strip Use to check blood sugar 2 times per day. Dx code E11.65 03/01/16   Elayne Snare, MD  Insulin Pen Needle 31G X 5 MM MISC Use with Victoza pen once a day 04/22/18   Elayne Snare, MD  Lancets (FREESTYLE) lancets Use as instructed to check blood sugar 2 times per day dx code E11.65 02/22/16   Elayne Snare, MD  TRADJENTA 5 MG TABS tablet TAKE 1 TABLET(5 MG) BY MOUTH DAILY 04/21/18   Elayne Snare, MD    Scheduled Meds: . donepezil  10 mg Oral QHS  . Fluocinolone Acetonide Scalp  1 application Topical QHS  . fluticasone  1 spray Each Nare Daily  . insulin aspart  0-9 Units Subcutaneous Q4H  . levothyroxine  50 mcg Oral QAC breakfast  . [START ON 06/05/2018] pantoprazole  40 mg Intravenous Q12H  . sevelamer carbonate  800 mg Oral Q supper  . simvastatin  20 mg Oral QHS   Infusions: . sodium chloride     PRN Meds: clobetasol, loratadine, methocarbamol, mupirocin ointment, Naftifine HCl, ondansetron **OR** ondansetron (ZOFRAN) IV, polyvinyl alcohol   Allergies as of 06/01/2018 - Review Complete 06/01/2018  Allergen Reaction Noted  . Penicillins Rash     Family History  Problem Relation Age of Onset  . Aneurysm Mother   . Heart disease Father   .  Stroke Father   . Hypertension Father   . Diabetes Father   . Dementia Neg Hx   . Colon cancer Neg Hx   . Esophageal cancer Neg Hx   . Pancreatic cancer Neg Hx   . Prostate cancer Neg Hx   . Rectal cancer Neg Hx   . Stomach cancer Neg Hx       Social History   Socioeconomic History  . Marital status: Married    Spouse name: [name] Mood  . Number of children: 1  . Years of education: 27  . Highest education level: Not on file  Occupational History  . Occupation: Retired  Scientific laboratory technician  . Financial resource strain: Not on file  . Food insecurity:    Worry: Not on file    Inability: Not on file  . Transportation needs:    Medical: No    Non-medical: No  Tobacco Use  . Smoking status: Former Smoker    Last attempt to quit: 09/24/1994    Years since quitting: 23.7  . Smokeless tobacco: Never Used  Substance and Sexual Activity  . Alcohol use: No    Alcohol/week: 0.0 standard drinks    Comment: Quit Oct. 1977 ("somewhat heavy")  . Drug use: No  . Sexual activity: Not on file  Lifestyle  . Physical activity:    Days per week: 3 days    Minutes per session: 10 min  . Stress: Not at all  Relationships  . Social connections:    Talks on phone: Not on file    Gets together: Not on file    Attends religious service: Not on file    Active member of club or organization: Not on file    Attends meetings of clubs or organizations: Not on file    Relationship status: Not on file  . Intimate partner violence:    Fear of current or ex partner: Not on file    Emotionally abused: Not on file    Physically abused: Not on file    Forced sexual activity: Not on file  Other Topics Concern  . Not on file  Social History Narrative   Lives at home with wife.   Caffeine use: Drinks no soda or tea.    Drinks 1 cup coffee/week   Right handed    REVIEW OF SYSTEMS: Constitutional:  weakness ENT:  No nose bleeds Pulm:  No SOB or cough CV:  No palpitations, no LE edema.  GU:  Scant urine ouotput.   GI:  Per HPI.  No dysphagia Heme:  Per HPI.  No unusual bleeding or bruising.     Transfusions:  No recalll of prior transfusions.   Neuro:  No headaches, no peripheral tingling or numbness.  No dizziness.   Derm:  Itching on left  arm  Endocrine:  No sweats or chills.  No polyuria or dysuria Immunization:  Not queried.  Reviewed.  Travel:  None beyond local counties in last few months.    PHYSICAL EXAM: Vital signs in last 24 hours: Vitals:   06/02/18 0519 06/02/18 0739  BP: (!) 142/58 (!) 155/50  Pulse: 74 71  Resp: 18 (!) 21  Temp: 98 F (36.7 C)   SpO2: 100% 100%   Wt Readings from Last 3 Encounters:  06/01/18 71.4 kg  05/06/18 70.3 kg  04/22/18 71.2 kg    General: talkative, alert, comfortable, not chronically or acutely ill looking Head:  No asymmetry, signs of trauma, swelling.    Eyes:  No scleral icterus.  EOMI.   Ears:  Not HOH.    Nose:  No congestion or discharge.   Mouth:  Moist, clear, pink oral MM Neck:  No JVD, masses, TMG, bruits Lungs:  Clear bil.  Good breath sounds.  No cough Heart: RRR.  No MRG.  S1, S2 present Abdomen:  Soft, BS hypoactive but normal timbre.  ND.  Long, prominent, well -healed, midline scar (from vascular bypass).  Slight, diffuse, tenderness in all quadrants, slightly > on left.  ~ 10 cm bruise on left lower abdomen.   Rectal: Dark, brown stool is FOBT +, no rectal masses or tenderness.     Musc/Skeltl: no joint redness, swelling or contractures Extremities:  No CCE.  Pulse palpable in old AVG on left arm.  New AVG on right is bandaged Neurologic:  Oriented x 3. Moves all 4s Skin:  No rash.  Healed sores on upper back.   Tattoos:  None seen.   Nodes:  No cervical adenopathy.     Psych:  Pleasant. Talkative.  Cooperative.    Intake/Output from previous day: 09/08 0701 - 09/09 0700 In: 1194 [I.V.:300; Blood:894] Out: 0  Intake/Output this shift: No intake/output data recorded.  LAB RESULTS: Recent Labs    06/01/18 1358 06/02/18 0725  WBC 10.0 8.0  HGB 5.8* 8.6*  HCT 18.7* 26.2*  PLT 223 208   BMET Lab Results  Component Value Date   NA 142 06/01/2018   NA 140 01/23/2018   NA 137 01/18/2018   K 4.6 06/01/2018   K 4.1 01/23/2018   K 4.0  01/18/2018   CL 101 06/01/2018   CL 96 (L) 01/23/2018   CL 95 (L) 01/18/2018   CO2 25 06/01/2018   CO2 31 01/23/2018   CO2 32 01/18/2018   GLUCOSE 102 (H) 06/01/2018   GLUCOSE 123 (H) 04/17/2018   GLUCOSE 151 (H) 02/20/2018   BUN 31 (H) 06/01/2018   BUN 12 01/23/2018   BUN 9 01/18/2018   CREATININE 5.62 (H) 06/01/2018   CREATININE 4.48 (H) 01/23/2018   CREATININE 4.27 (H) 01/18/2018   CALCIUM 9.1 06/01/2018   CALCIUM 7.9 (L) 04/17/2018   CALCIUM 8.8 (L) 01/23/2018   LFT Recent Labs    06/01/18 1358  PROT 5.0*  ALBUMIN 2.6*  AST 26  ALT 12  ALKPHOS 75  BILITOT 0.6   PT/INR Lab Results  Component Value Date   INR 1.01 06/05/2016   Hepatitis Panel No results for input(s): HEPBSAG, HCVAB, HEPAIGM, HEPBIGM in the last 72 hours. C-Diff No components found for: CDIFF Lipase     Component Value Date/Time   LIPASE 70 (H) 06/01/2018 1358    Drugs of Abuse  No results found for: LABOPIA, COCAINSCRNUR, LABBENZ, AMPHETMU, THCU, LABBARB   RADIOLOGY STUDIES: Ct Abdomen Pelvis W Contrast  Result Date: 06/01/2018 CLINICAL DATA:  Pain on the left.  Dialysis patient. EXAM: CT ABDOMEN AND PELVIS WITH CONTRAST TECHNIQUE: Multidetector CT imaging of the abdomen and pelvis was performed using the standard protocol following bolus administration of intravenous contrast. CONTRAST:  183m ISOVUE-300 IOPAMIDOL (ISOVUE-300) INJECTION 61% COMPARISON:  Jan 23, 2018 FINDINGS: Lower chest: No acute abnormality. Hepatobiliary: Multiple tiny low-attenuation lesions in the liver too small to characterize but almost certainly cysts. No solid masses are noted. Mild hepatic steatosis. The portal vein is patent. The gallbladder is unremarkable. Pancreas: Unremarkable. No pancreatic ductal dilatation or surrounding inflammatory changes. Spleen: Normal in size without focal abnormality. Adrenals/Urinary Tract: Adrenal glands are normal.  Kidneys are atrophic with numerous bilateral cysts. No hydronephrosis  or stones. No suspicious solid masses. The ureters are normal. The bladder is decompressed limiting evaluation. There is mild increased attenuation in the fat adjacent to the bladder. Stomach/Bowel: The stomach and small bowel are normal. Colonic diverticulosis is seen without diverticulitis. The remainder of the colon is normal. The appendix is unremarkable. Vascular/Lymphatic: The patient is status post aorta aortic bypass. Atherosclerotic changes are seen in the native aorta. Atherosclerotic changes are seen at the origin of the SMA and renal arteries, similar in the interval. No aneurysm or dissection. Atherosclerotic changes extend into the femoral and iliac vessels. No adenopathy. Reproductive: Prostate is unremarkable. Other: No free air or free fluid. Musculoskeletal: Patient is status post vertebroplasty of L3, stable. A compression fracture of L5 is stable. A compression fracture L2 is stable. The compression fracture of L1 has worsened in the interval with more loss of height. Mild wedging of T12 and T11 are stable. IMPRESSION: 1. The L1 compression fracture has worsened since May of 2019 with increased loss of height. Other multilevel compression fractures are stable. 2. The kidneys are atrophic with multiple cysts. No acute abnormalities. 3. Multiple probable cysts in the liver. 4. Hepatic steatosis. 5. Colonic diverticulosis without diverticulitis. 6. Aortic bypass.  Atherosclerosis. Electronically Signed   By: Dorise Bullion III M.D   On: 06/01/2018 14:55   Dg Chest Port 1 View  Result Date: 06/01/2018 CLINICAL DATA:  Acute LEFT LOWER chest/UPPER abdominal pain. EXAM: PORTABLE CHEST 1 VIEW COMPARISON:  01/12/2018 chest radiograph FINDINGS: The cardiomediastinal silhouette is unremarkable. There is no evidence of focal airspace disease, pulmonary edema, suspicious pulmonary nodule/mass, pleural effusion, or pneumothorax. No acute bony abnormalities are identified. No acute rib fracture identified.  IMPRESSION: No evidence of acute cardiopulmonary disease or acute rib fracture. Electronically Signed   By: Margarette Canada M.D.   On: 06/01/2018 23:16     IMPRESSION:   *  Acute on chronic macrocytic anemia in setting of tarry stools.   Colonoscopy, EGD 10/2017: adenomatous colon polyps, solitary colonic erosion;  esophageal stricture, mild gastritis.   Rapid emptying on 01/2018 GES.  Significance unclear but Nephrologist plans referral to [facility].   Previous GI complaints including diarrhea (improved with Miralax), anorexia (also improved in last several months).   No evidence of internal, RP bleeding on CT.  However has ASPVD so ? subclilnical gut ischemia?   *   ESRD.  HD MWF.  HD planned for this PM.    *   Elevated Lipase, normal LFTs.  No evidence biliary or pancreatic abnormalities on CT    PLAN:     *    D/w Dr Bryan Lemma.  Enteroscopy tomorrow [phone], as no time available for this afternoon.  Can feed pt today.    *  Switch to oral Protonix 40 mg daily.  Stop IV PPI.    *   CBC in AM.  Hgb this evening.  Folate, B 12 levels ordered to add on to prior collection.     Azucena Freed  06/02/2018, 8:41 AM Phone [phone]   Attending physician's note   I have reviewed the chart, labs, and rads and I agree with the Advanced Practitioner's note, impression, and recommendations. I discussed his clinical presentation at length with his wife as well.  He has been evaluated earlier this year with EGD and colonoscopy as noted above, but presents with what appears to be a slowly progressive macrocytic anemia with  admission hemoglobin of 5.8 (MCV/RDW 109/21), necessitating admission with RBC transfusion.  Agree with further evaluation of anemia with folate and B12, collected from pretransfusion labs.  We will plan on enteroscopy tomorrow to evaluate for UGI etiology for anemia.  Scheduled for hemodialysis today. Additional recommendations pending endoscopic findings.  If no source for bleeding  noted on enteroscopy, may consider VCE as outpatient for further small bowel interrogation.  [address] DO, FACG [phone] office

## 2018-06-02 NOTE — Anesthesia Preprocedure Evaluation (Addendum)
Anesthesia Evaluation  Patient identified by MRN, date of birth, ID band Patient awake    Reviewed: Allergy & Precautions, NPO status , Patient's Chart, lab work & pertinent test results  History of Anesthesia Complications Negative for: history of anesthetic complications  Airway Mallampati: II  TM Distance: >3 FB Neck ROM: Full    Dental  (+) Upper Dentures, Missing, Dental Advisory Given, Poor Dentition   Pulmonary sleep apnea (does not use CPAP) , former smoker (quit 1996),    breath sounds clear to auscultation       Cardiovascular hypertension, + Peripheral Vascular Disease (s/p aortobifem)   Rhythm:Regular Rate:Normal  04/29/18 ECHO: EF 55-60%, valves OK   Neuro/Psych Dementia negative neurological ROS     GI/Hepatic Neg liver ROS, GERD  Medicated and Controlled,GI bleed   Endo/Other  diabetes (glu 110), Oral Hypoglycemic Agents, Insulin DependentHypothyroidism   Renal/GU ESRF and DialysisRenal disease     Musculoskeletal  (+) Arthritis ,   Abdominal   Peds  Hematology negative hematology ROS (+)   Anesthesia Other Findings   Reproductive/Obstetrics                            Anesthesia Physical Anesthesia Plan  ASA: III  Anesthesia Plan: MAC   Post-op Pain Management:    Induction:   PONV Risk Score and Plan: 1 and Treatment may vary due to age or medical condition  Airway Management Planned: Nasal Cannula  Additional Equipment:   Intra-op Plan:   Post-operative Plan:   Informed Consent: I have reviewed the patients History and Physical, chart, labs and discussed the procedure including the risks, benefits and alternatives for the proposed anesthesia with the patient or authorized representative who has indicated his/her understanding and acceptance.   Dental advisory given  Plan Discussed with: CRNA and Surgeon  Anesthesia Plan Comments: (Plan routine monitors,  MAC)       Anesthesia Quick Evaluation

## 2018-06-03 ENCOUNTER — Inpatient Hospital Stay (HOSPITAL_COMMUNITY): Payer: Medicare Other | Admitting: Anesthesiology

## 2018-06-03 ENCOUNTER — Encounter (HOSPITAL_COMMUNITY)
Admission: EM | Disposition: A | Discharge status: Home / Self Care | Payer: Self-pay | Source: Home / Self Care | Attending: Family Medicine

## 2018-06-03 ENCOUNTER — Telehealth: Payer: Self-pay | Admitting: Emergency Medicine

## 2018-06-03 ENCOUNTER — Encounter (HOSPITAL_COMMUNITY): Payer: Self-pay

## 2018-06-03 DIAGNOSIS — Z794 Long term (current) use of insulin: Secondary | ICD-10-CM

## 2018-06-03 DIAGNOSIS — K31819 Angiodysplasia of stomach and duodenum without bleeding: Secondary | ICD-10-CM

## 2018-06-03 DIAGNOSIS — K31811 Angiodysplasia of stomach and duodenum with bleeding: Principal | ICD-10-CM

## 2018-06-03 DIAGNOSIS — E119 Type 2 diabetes mellitus without complications: Secondary | ICD-10-CM

## 2018-06-03 HISTORY — PX: ENTEROSCOPY: SHX5533

## 2018-06-03 HISTORY — PX: HOT HEMOSTASIS: SHX5433

## 2018-06-03 LAB — POCT I-STAT 4, (NA,K, GLUC, HGB,HCT)
Glucose, Bld: 93 mg/dL (ref 70–99)
HCT: 26 % — ABNORMAL LOW (ref 39.0–52.0)
Hemoglobin: 8.8 g/dL — ABNORMAL LOW (ref 13.0–17.0)
Potassium: 3.3 mmol/L — ABNORMAL LOW (ref 3.5–5.1)
Sodium: 139 mmol/L (ref 135–145)

## 2018-06-03 LAB — GLUCOSE, CAPILLARY
Glucose-Capillary: 110 mg/dL — ABNORMAL HIGH (ref 70–99)
Glucose-Capillary: 118 mg/dL — ABNORMAL HIGH (ref 70–99)
Glucose-Capillary: 94 mg/dL (ref 70–99)
Glucose-Capillary: 97 mg/dL (ref 70–99)

## 2018-06-03 LAB — CBC
HCT: 28.3 % — ABNORMAL LOW (ref 39.0–52.0)
Hemoglobin: 9.1 g/dL — ABNORMAL LOW (ref 13.0–17.0)
MCH: 31.5 pg (ref 26.0–34.0)
MCHC: 32.2 g/dL (ref 30.0–36.0)
MCV: 97.9 fL (ref 78.0–100.0)
Platelets: 195 10*3/uL (ref 150–400)
RBC: 2.89 MIL/uL — ABNORMAL LOW (ref 4.22–5.81)
RDW: 21 % — ABNORMAL HIGH (ref 11.5–15.5)
WBC: 8.4 10*3/uL (ref 4.0–10.5)

## 2018-06-03 LAB — FOLATE RBC
Folate, Hemolysate: >620 ng/mL
Folate, RBC: >2288 ng/mL (ref >498)
Hematocrit: 27.1 % — ABNORMAL LOW (ref 37.5–51.0)

## 2018-06-03 SURGERY — ENTEROSCOPY
Anesthesia: Monitor Anesthesia Care

## 2018-06-03 MED ORDER — SODIUM CHLORIDE 0.9 % IV SOLN
INTRAVENOUS | Status: DC
  Administered 2018-06-03: 08:00:00 via INTRAVENOUS

## 2018-06-03 MED ORDER — PROMETHAZINE HCL 25 MG/ML IJ SOLN: 6.2500 mg | INTRAMUSCULAR | Status: DC | PRN

## 2018-06-03 MED ORDER — MIDAZOLAM HCL 2 MG/2ML IJ SOLN: 0.5000 mg | Freq: Once | INTRAMUSCULAR | Status: DC | PRN

## 2018-06-03 MED ORDER — PROPOFOL 500 MG/50ML IV EMUL
INTRAVENOUS | Status: DC | PRN
  Administered 2018-06-03: 100 ug/kg/min via INTRAVENOUS

## 2018-06-03 MED ORDER — MEPERIDINE HCL 25 MG/ML IJ SOLN: 6.2500 mg | INTRAMUSCULAR | Status: DC | PRN

## 2018-06-03 MED ORDER — VITAMIN B-12 1000 MCG PO TABS: 1000.0000 ug | ORAL_TABLET | Freq: Every day | ORAL | 0 refills | Status: DC

## 2018-06-03 MED ORDER — PROPOFOL 10 MG/ML IV BOLUS
INTRAVENOUS | Status: DC | PRN
  Administered 2018-06-03 (×2): 10 mg via INTRAVENOUS
  Administered 2018-06-03 (×3): 20 mg via INTRAVENOUS

## 2018-06-03 MED ORDER — CHLORHEXIDINE GLUCONATE CLOTH 2 % EX PADS: 6.0000 | MEDICATED_PAD | Freq: Every day | CUTANEOUS | Status: DC

## 2018-06-03 MED ORDER — LIDOCAINE 2% (20 MG/ML) 5 ML SYRINGE
INTRAMUSCULAR | Status: DC | PRN
  Administered 2018-06-03: 40 mg via INTRAVENOUS

## 2018-06-03 MED ORDER — PHENYLEPHRINE 40 MCG/ML (10ML) SYRINGE FOR IV PUSH (FOR BLOOD PRESSURE SUPPORT)
PREFILLED_SYRINGE | INTRAVENOUS | Status: DC | PRN
  Administered 2018-06-03: 120 ug via INTRAVENOUS
  Administered 2018-06-03: 80 ug via INTRAVENOUS
  Administered 2018-06-03: 120 ug via INTRAVENOUS

## 2018-06-03 NOTE — Anesthesia Procedure Notes (Signed)
Procedure Name: MAC Performed by: Annah Jasko B, CRNA Pre-anesthesia Checklist: Patient identified, Emergency Drugs available, Suction available, Patient being monitored and Timeout performed Patient Re-evaluated:Patient Re-evaluated prior to induction Oxygen Delivery Method: Nasal cannula Preoxygenation: Pre-oxygenation with 100% oxygen Induction Type: IV induction Airway Equipment and Method: Bite block Placement Confirmation: positive ETCO2 Dental Injury: Teeth and Oropharynx as per pre-operative assessment        

## 2018-06-03 NOTE — Progress Notes (Signed)
Subjective: Interval History: has no complaint ,got endo.  Objective: Vital signs in last 24 hours: Temp:  [97.7 F (36.5 C)-98.8 F (37.1 C)] 97.7 F (36.5 C) (09/10 1012) Pulse Rate:  [59-94] 72 (09/10 1012) Resp:  [12-24] 18 (09/10 1012) BP: (97-169)/(46-87) 134/71 (09/10 1012) SpO2:  [94 %-100 %] 100 % (09/10 1012) Weight:  [71.1 kg-72.1 kg] 71.1 kg (09/09 1754) Weight change: 2 kg  Intake/Output from previous day: 09/09 0701 - 09/10 0700 In: 120 [P.O.:120] Out: 831  Intake/Output this shift: No intake/output data recorded.  General appearance: alert, cooperative and no distress Resp: diminished breath sounds bilaterally Cardio: S1, S2 normal and systolic murmur: systolic ejection 2/6, crescendo and decrescendo at 2nd left intercostal space GI: soft, non-tender; bowel sounds normal; no masses,  no organomegaly Extremities: AVF RUA  Lab Results: Recent Labs    06/02/18 0725  06/03/18 0825 06/03/18 1020  WBC 8.0  --   --  8.4  HGB 8.6*   < > 8.8* 9.1*  HCT 26.2*   < > 26.0* 28.3*  PLT 208  --   --  195   < > = values in this interval not displayed.   BMET:  Recent Labs    06/01/18 1358 06/02/18 0725 06/03/18 0825  NA 142 141 139  K 4.6 4.7 3.3*  CL 101 104  --   CO2 25 24  --   GLUCOSE 102* 77 93  BUN 31* 34*  --   CREATININE 5.62* 6.68*  --   CALCIUM 9.1 8.7*  --    No results for input(s): PTH in the last 72 hours. Iron Studies: No results for input(s): IRON, TIBC, TRANSFERRIN, FERRITIN in the last 72 hours.  Studies/Results: Ct Abdomen Pelvis W Contrast  Result Date: 06/01/2018 CLINICAL DATA:  Pain on the left.  Dialysis patient. EXAM: CT ABDOMEN AND PELVIS WITH CONTRAST TECHNIQUE: Multidetector CT imaging of the abdomen and pelvis was performed using the standard protocol following bolus administration of intravenous contrast. CONTRAST:  137mL ISOVUE-300 IOPAMIDOL (ISOVUE-300) INJECTION 61% COMPARISON:  Jan 23, 2018 FINDINGS: Lower chest: No acute  abnormality. Hepatobiliary: Multiple tiny low-attenuation lesions in the liver too small to characterize but almost certainly cysts. No solid masses are noted. Mild hepatic steatosis. The portal vein is patent. The gallbladder is unremarkable. Pancreas: Unremarkable. No pancreatic ductal dilatation or surrounding inflammatory changes. Spleen: Normal in size without focal abnormality. Adrenals/Urinary Tract: Adrenal glands are normal. Kidneys are atrophic with numerous bilateral cysts. No hydronephrosis or stones. No suspicious solid masses. The ureters are normal. The bladder is decompressed limiting evaluation. There is mild increased attenuation in the fat adjacent to the bladder. Stomach/Bowel: The stomach and small bowel are normal. Colonic diverticulosis is seen without diverticulitis. The remainder of the colon is normal. The appendix is unremarkable. Vascular/Lymphatic: The patient is status post aorta aortic bypass. Atherosclerotic changes are seen in the native aorta. Atherosclerotic changes are seen at the origin of the SMA and renal arteries, similar in the interval. No aneurysm or dissection. Atherosclerotic changes extend into the femoral and iliac vessels. No adenopathy. Reproductive: Prostate is unremarkable. Other: No free air or free fluid. Musculoskeletal: Patient is status post vertebroplasty of L3, stable. A compression fracture of L5 is stable. A compression fracture L2 is stable. The compression fracture of L1 has worsened in the interval with more loss of height. Mild wedging of T12 and T11 are stable. IMPRESSION: 1. The L1 compression fracture has worsened since May of 2019 with  increased loss of height. Other multilevel compression fractures are stable. 2. The kidneys are atrophic with multiple cysts. No acute abnormalities. 3. Multiple probable cysts in the liver. 4. Hepatic steatosis. 5. Colonic diverticulosis without diverticulitis. 6. Aortic bypass.  Atherosclerosis. Electronically  Signed   By: Dorise Bullion III M.D   On: 06/01/2018 14:55   Dg Chest Port 1 View  Result Date: 06/01/2018 CLINICAL DATA:  Acute LEFT LOWER chest/UPPER abdominal pain. EXAM: PORTABLE CHEST 1 VIEW COMPARISON:  01/12/2018 chest radiograph FINDINGS: The cardiomediastinal silhouette is unremarkable. There is no evidence of focal airspace disease, pulmonary edema, suspicious pulmonary nodule/mass, pleural effusion, or pneumothorax. No acute bony abnormalities are identified. No acute rib fracture identified. IMPRESSION: No evidence of acute cardiopulmonary disease or acute rib fracture. Electronically Signed   By: Margarette Canada M.D.   On: 06/01/2018 23:16    I have reviewed the patient's current medications.  Assessment/Plan: 1 ESRD HD tomorrow 2 Anemia GIB stable 3 GIB awaiting endo results 4 HPTH vit D  5 Divertic dz P HD, esa, , follow Hb   LOS: 2 days   [name] 06/03/2018,11:01 AM

## 2018-06-03 NOTE — Interval H&P Note (Signed)
History and Physical Interval Note:  06/03/2018 7:52 AM  [name]  has presented today for surgery, with the diagnosis of anemia, dark/FOBT + stools.  The various methods of treatment have been discussed with the patient and family. After consideration of risks, benefits and other options for treatment, the patient has consented to  Procedure(s): ENTEROSCOPY (N/A) as a surgical intervention .  The patient's history has been reviewed, patient examined, no change in status, stable for surgery.  I have reviewed the patient's chart and labs.  Questions were answered to the patient's satisfaction.     Dominic Pea Idamae Coccia

## 2018-06-03 NOTE — Discharge Summary (Signed)
Physician Discharge Summary  [name] MGQ:[id number] DOB: 1941/09/09 DOA: 06/01/2018  PCP: Elby Showers, MD  Admit date: 06/01/2018 Discharge date: 06/03/2018  Admitted From: Home Disposition: Home   Recommendations for Outpatient Follow-up:  1. Follow up with PCP in 1-2 weeks 2. [facility] to contact patient to schedule follow up 3. Please obtain CBC in one week  Home Health: None new Equipment/Devices: None new Discharge Condition: Stable CODE STATUS: Full Diet recommendation: Renal, carb-modified  Brief/Interim Summary: [name] is a 77 y.o. male with a history of ESRD, T2DM, PVD s/p aortobifem bypass, and HTN. He reports melanotic stools for a couple weeks and noted progressive hgb drop in outpatient HD over the past month. Also notes loose stools, poor per oral intake, abdominal cramping and weight loss over the previous several months. Called and told to come to the ED a day prior but didn't come in until he noted left sided abdominal pain. Hgb found to be 5.8 with +FOBT. CT abdomen and pelvis noted diverticulosis without inflammation and incidental progression of one of his several lumbar compression fractures. 2u PRBCs given, started PPI, and admitted. GI consulted and recommending enteroscopy. Nephrology consulted for routine HD which was performed 9/9. Enteroscopy 9/10 demonstrated normal examined small bowel but small hemorrhage overlying an AVM which was treated with APC. He has tolerated a diet and demonstrated no further bleeding, stable blood counts.   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 Had colonoscopy and EGD this year showing mild diverticulosis, transverse colon erosion, a  tubular adenoma, and a hyperplastic polyp as well as mild gastritis w/negative H. pylori Bx.  - Continue to monitor at follow up. [facility] to call with follow up information  Acute blood loss anemia on anemia of chronic disease, macrocytic anemia:  - s/p 2u PRBCs, hgb up 5.8 > 9.1. Monitor at follow up.  - B12 on lower limit of normal, will add daily replacement given macrocytosis.  - Recently increased venofer due to low Tsat, plan to maximize aranesp  ESRD:  - Routine HD per nephrology  Lumbar compression fractures with progression of L1 height loss, osteoporosis: No recent trauma and no symptoms attributable to this.  - Supportive care.  - Continue outpatient prolia  T2DM: Well-controlled. HbA1c 5.2%.  - Continue home medications. If A1c remains so low, consider deescalating therapies.  PVD s/p aortobifem BPG 1992:  - Continue ASA  Hypothyroidism: Last TSH 0.9 - Continue synthroid 64mg  Hyperlipidemia:  - Continue statin  Loose stools: Worked up as outpatient with [facility] July 2019 showing rapid gastric emptying.  - [facility] referral pending as outpatient.   Cognitive impairment:  - Continue aricept.   Discharge Instructions Discharge Instructions    Diet - low sodium heart healthy   Complete by:  As directed    Discharge instructions   Complete by:  As directed    You were admitted for GI bleeding causing severe anemia. Fortunately, your blood counts have stabilized and you are not exhibiting signs of bleeding after the transfusions. An area of blood vessel malformation in the stomach was noted during the enteroscopy and this was able to be treated at the time of the procedure.   Your vitamin B12  level was on the lower end of normal, so it is recommended that you start taking a supplement daily. This was sent to your pharmacy. You will need to follow up with your GI doctor and continue dialysis and other medications as directed. Return for care if you notice bleeding.    Increase activity slowly   Complete by:  As directed      Allergies as of 06/03/2018      Reactions   Penicillins Rash   Has patient had a PCN reaction causing immediate rash, facial/tongue/throat swelling, SOB or lightheadedness with hypotension: Yes Has patient had a PCN reaction causing severe rash involving mucus membranes or skin necrosis: Yes Has patient had a PCN reaction that required hospitalization: No Has patient had a PCN reaction occurring within the last 10 years: No If all of the above answers are "NO", then may proceed with Cephalosporin use.      Medication List    TAKE these medications   acetaminophen 500 MG tablet Commonly known as:  TYLENOL Take 1,000 mg by mouth 2 (two) times daily as needed (for pain).   allopurinol 100 MG tablet Commonly known as:  ZYLOPRIM Take 100 mg by mouth daily as needed (pain).   aspirin 325 MG tablet Take 325 mg by mouth daily.   Carboxymethylcellulose Sod PF 0.25 % Soln Place 1 drop into both eyes as needed (dry eyes).   clobetasol 0.05 % external solution Commonly known as:  TEMOVATE Apply 1 application topically 2 (two) times daily as needed (irritation). Apply to scalp   denosumab 60 MG/ML Sosy injection Commonly known as:  PROLIA Inject 60 mg into the skin every 6 (six) months.   docusate sodium 100 MG capsule Commonly known as:  COLACE Take 1 capsule (100 mg total) by mouth daily. To avoid constipation from pain medication   donepezil 10 MG tablet Commonly known as:  ARICEPT Take 1 tablet (10 mg total) by mouth at bedtime.   Fluocinolone Acetonide Scalp 0.01 % Oil Apply 1 application topically at bedtime. Apply to scalp   fluticasone 0.05 % cream Commonly known as:  CUTIVATE Apply 1 application topically 2 (two) times daily as needed (irritation). Apply to face   fluticasone 50 MCG/ACT nasal spray Commonly known as:  FLONASE Place 1 spray into both nostrils daily. What changed:    when to take  this  reasons to take this   FREESTYLE FREEDOM LITE w/Device Kit Use to check blood sugar 2 times per day dx code E11.65   freestyle lancets Use as instructed to check blood sugar 2 times per day dx code E11.65   glucose blood test strip Use to check blood sugar 2 times per day. Dx code E11.65   glucose blood test strip Use as instructed to check blood sugar 3 times daily.   Insulin Pen Needle 31G X 5 MM Misc Use with Victoza pen once a day   ketoconazole 2 % shampoo Commonly known as:  NIZORAL Apply 1 application topically 2 (two) times a week. What changed:  when to take this   levothyroxine 50 MCG tablet Commonly known as:  SYNTHROID, LEVOTHROID Take 1 tablet (50 mcg total) by mouth daily.   liraglutide 18 MG/3ML Sopn Commonly known as:  VICTOZA Inject 0.1 mLs (0.6 mg total) into the skin daily. What changed:  when to take this   loratadine 10 MG tablet Commonly known as:  CLARITIN Take 10 mg by mouth daily as needed for allergies.  methocarbamol 500 MG tablet Commonly known as:  ROBAXIN Take 1 tablet (500 mg total) by mouth at bedtime as needed for muscle spasms.   mupirocin ointment 2 % Commonly known as:  BACTROBAN Use as directed to affected areas What changed:    how much to take  how to take this  when to take this  reasons to take this  additional instructions   Naftifine HCl 2 % Crea Apply to feet daily for fungal infection What changed:    how much to take  how to take this  when to take this  reasons to take this  additional instructions   nystatin-triamcinolone cream Commonly known as:  MYCOLOG II Apply 1 application topically 2 (two) times daily.   omeprazole 40 MG capsule Commonly known as:  PRILOSEC Take 1 capsule (40 mg total) by mouth daily.   RENVELA 800 MG tablet Generic drug:  sevelamer carbonate Take 800 mg by mouth daily with supper.   simvastatin 20 MG tablet Commonly known as:  ZOCOR Take 20 mg by mouth at  bedtime.   TRADJENTA 5 MG Tabs tablet Generic drug:  linagliptin TAKE 1 TABLET(5 MG) BY MOUTH DAILY   vitamin B-12 1000 MCG tablet Commonly known as:  CYANOCOBALAMIN Take 1 tablet (1,000 mcg total) by mouth daily.      Follow-up Information    Baxley, Cresenciano Lick, MD. Schedule an appointment as soon as possible for a visit.   Specialty:  Internal Medicine Contact information: [address] [address] [facility] Follow up.   Specialty:  Gastroenterology Why:  Call the office if you are not contacted for an appointment in the next 1 week Contact information: [place] [address] [phone]         Allergies  Allergen Reactions  . Penicillins Rash    Has patient had a PCN reaction causing immediate rash, facial/tongue/throat swelling, SOB or lightheadedness with hypotension: Yes Has patient had a PCN reaction causing severe rash involving mucus membranes or skin necrosis: Yes Has patient had a PCN reaction that required hospitalization: No Has patient had a PCN reaction occurring within the last 10 years: No If all of the above answers are "NO", then may proceed with Cephalosporin use.     Consultations:   GI  Procedures/Studies: Ct Abdomen Pelvis W Contrast  Result Date: 06/01/2018 CLINICAL DATA:  Pain on the left.  Dialysis patient. EXAM: CT ABDOMEN AND PELVIS WITH CONTRAST TECHNIQUE: Multidetector CT imaging of the abdomen and pelvis was performed using the standard protocol following bolus administration of intravenous contrast. CONTRAST:  148m ISOVUE-300 IOPAMIDOL (ISOVUE-300) INJECTION 61% COMPARISON:  Jan 23, 2018 FINDINGS: Lower chest: No acute abnormality. Hepatobiliary: Multiple tiny low-attenuation lesions in the liver too small to characterize but almost certainly cysts. No solid masses are noted. Mild hepatic steatosis. The portal vein is patent. The gallbladder  is unremarkable. Pancreas: Unremarkable. No pancreatic ductal dilatation or surrounding inflammatory changes. Spleen: Normal in size without focal abnormality. Adrenals/Urinary Tract: Adrenal glands are normal. Kidneys are atrophic with numerous bilateral cysts. No hydronephrosis or stones. No suspicious solid masses. The ureters are normal. The bladder is decompressed limiting evaluation. There is mild increased attenuation in the fat adjacent to the bladder. Stomach/Bowel: The stomach and small bowel are normal. Colonic diverticulosis is seen without diverticulitis. The remainder of the colon is normal. The appendix is unremarkable. Vascular/Lymphatic: The patient is status post aorta aortic  bypass. Atherosclerotic changes are seen in the native aorta. Atherosclerotic changes are seen at the origin of the SMA and renal arteries, similar in the interval. No aneurysm or dissection. Atherosclerotic changes extend into the femoral and iliac vessels. No adenopathy. Reproductive: Prostate is unremarkable. Other: No free air or free fluid. Musculoskeletal: Patient is status post vertebroplasty of L3, stable. A compression fracture of L5 is stable. A compression fracture L2 is stable. The compression fracture of L1 has worsened in the interval with more loss of height. Mild wedging of T12 and T11 are stable. IMPRESSION: 1. The L1 compression fracture has worsened since May of 2019 with increased loss of height. Other multilevel compression fractures are stable. 2. The kidneys are atrophic with multiple cysts. No acute abnormalities. 3. Multiple probable cysts in the liver. 4. Hepatic steatosis. 5. Colonic diverticulosis without diverticulitis. 6. Aortic bypass.  Atherosclerosis. Electronically Signed   By: Dorise Bullion III M.D   On: 06/01/2018 14:55   Nm Parathyroid W/spect  Result Date: 05/16/2018 CLINICAL DATA:  End-stage renal disease, elevated parathormone = 2958 EXAM: NM PARATHYROID SCINTIGRAPHY AND SPECT  IMAGING TECHNIQUE: Following intravenous administration of radiopharmaceutical, early and 2-hour delayed planar images were obtained in the anterior projection. Delayed triplanar SPECT images were also obtained at 2 hours. RADIOPHARMACEUTICALS:  25.6 mCi Tc-73mSestamibi IV COMPARISON:  None FINDINGS: Planar imaging: Normal distribution of sestamibi in the thyroid lobes. Fairly good washout from both thyroid lobes without definite focal retained tracer. SPECT imaging: Mild residual tracer within the thyroid lobes. No definite abnormal focal retained tracer activity to suggest parathyroid adenoma. No ectopic localization of tracer. IMPRESSION: No definite scintigraphic evidence of parathyroid adenoma. Electronically Signed   By: MLavonia DanaM.D.   On: 05/16/2018 14:07   Dg Chest Port 1 View  Result Date: 06/01/2018 CLINICAL DATA:  Acute LEFT LOWER chest/UPPER abdominal pain. EXAM: PORTABLE CHEST 1 VIEW COMPARISON:  01/12/2018 chest radiograph FINDINGS: The cardiomediastinal silhouette is unremarkable. There is no evidence of focal airspace disease, pulmonary edema, suspicious pulmonary nodule/mass, pleural effusion, or pneumothorax. No acute bony abnormalities are identified. No acute rib fracture identified. IMPRESSION: No evidence of acute cardiopulmonary disease or acute rib fracture. Electronically Signed   By: JMargarette CanadaM.D.   On: 06/01/2018 23:16   ENTEROSCOPY 06/03/2018 by Dr. CBryan Lemma Findings:      There was no evidence of significant pathology in the entire examined       portion of jejunum.      There was no evidence of significant pathology in the duodenal bulb, in       the first portion of the duodenum, in the second portion of the       duodenum, in the third portion of the duodenum and in the fourth portion       of the duodenum.      The examined esophagus was normal.      A single 3 mm stigmata of recent bleeding angioectasia was found in the       gastric fundus. Coagulation for  hemostasis using argon plasma at 1       liter/minute and 20 watts was successful. Estimated blood loss: none.      The gastric body, gastric antrum and pylorus were normal. Impression:       - The examined portion of the jejunum was normal.                           -  Normal duodenal bulb, first portion of the                            duodenum, second portion of the duodenum, third                            portion of the duodenum and fourth portion of the                            duodenum.                           - Normal esophagus.                           - A single recently bleeding angioectasia in the                            stomach. Treated with argon plasma coagulation                            (APC).                           - Normal gastric body, antrum and pylorus.                           - No specimens collected. Recommendation: Return patient to hospital ward for ongoing care.                           - Resume full liquid diet now then can advance to                            previous diet later today.                           - Continue present medications.  Subjective: Feels well. Reported some abdominal pain earlier but overall much less than at admission and none at this time. No N/V/D. Feels somewhat weak still but improved from admission.   Discharge Exam: Vitals:   06/03/18 0917 06/03/18 1012  BP: (!) 101/52 134/71  Pulse: 74 72  Resp: (!) 23 18  Temp:  97.7 F (36.5 C)  SpO2: 100% 100%   General: Pt is alert, awake, not in acute distress Cardiovascular: RRR, II/VI SEM at base. AVF RUE +thrill. Respiratory: CTA bilaterally, no wheezing, no rhonchi Abdominal: Soft, NT, ND, bowel sounds + Extremities: No edema, no cyanosis  Labs: BNP (last 3 results) No results for input(s): BNP in the last 8760 hours. Basic Metabolic Panel: Recent Labs  Lab 06/01/18 1358 06/02/18 0725 06/03/18 0825  NA 142 141 139  K 4.6 4.7 3.3*  CL 101 104  --    CO2 25 24  --   GLUCOSE 102* 77 93  BUN 31* 34*  --   CREATININE 5.62* 6.68*  --   CALCIUM 9.1 8.7*  --    Liver Function Tests: Recent Labs  Lab 06/01/18 1358 06/02/18 0725  AST 26  23  ALT 12 10  ALKPHOS 75 59  BILITOT 0.6 0.6  PROT 5.0* 5.1*  ALBUMIN 2.6* 2.5*   Recent Labs  Lab 06/01/18 1358  LIPASE 70*   No results for input(s): AMMONIA in the last 168 hours. CBC: Recent Labs  Lab 06/01/18 1358 06/02/18 0725 06/02/18 1823 06/03/18 0825 06/03/18 1020  WBC 10.0 8.0  --   --  8.4  NEUTROABS 6.9  --   --   --   --   HGB 5.8* 8.6* 9.7* 8.8* 9.1*  HCT 18.7* 26.2* 29.2* 26.0* 28.3*  MCV 109.4* 97.0  --   --  97.9  PLT 223 208  --   --  195   Cardiac Enzymes: No results for input(s): CKTOTAL, CKMB, CKMBINDEX, TROPONINI in the last 168 hours. BNP: Invalid input(s): POCBNP CBG: Recent Labs  Lab 06/02/18 1710 06/02/18 2007 06/03/18 0029 06/03/18 0354 06/03/18 0749  GLUCAP 143* 220* 94 110* 97   D-Dimer No results for input(s): DDIMER in the last 72 hours. Hgb A1c Recent Labs    06/01/18 1358  HGBA1C 5.2   Lipid Profile No results for input(s): CHOL, HDL, LDLCALC, TRIG, CHOLHDL, LDLDIRECT in the last 72 hours. Thyroid function studies No results for input(s): TSH, T4TOTAL, T3FREE, THYROIDAB in the last 72 hours.  Invalid input(s): FREET3 Anemia work up Recent Labs    06/01/18 1358  VITAMINB12 254   Urinalysis    Component Value Date/Time   COLORURINE YELLOW 01/23/2018 2043   APPEARANCEUR HAZY (A) 01/23/2018 2043   LABSPEC 1.015 01/23/2018 2043   PHURINE 9.0 (H) 01/23/2018 2043   GLUCOSEU NEGATIVE 01/23/2018 2043   HGBUR NEGATIVE 01/23/2018 2043   BILIRUBINUR NEGATIVE 01/23/2018 2043   BILIRUBINUR neg 02/14/2015 [place] NEGATIVE 01/23/2018 2043   PROTEINUR 100 (A) 01/23/2018 2043   UROBILINOGEN 0.2 02/20/2015 0944   NITRITE NEGATIVE 01/23/2018 2043   LEUKOCYTESUR TRACE (A) 01/23/2018 2043    Microbiology Recent Results  (from the past 240 hour(s))  MRSA PCR Screening     Status: None   Collection Time: 06/02/18  6:38 AM  Result Value Ref Range Status   MRSA by PCR NEGATIVE NEGATIVE Final    Comment:        The GeneXpert MRSA Assay (FDA approved for NASAL specimens only), is one component of a comprehensive MRSA colonization surveillance program. It is not intended to diagnose MRSA infection nor to guide or monitor treatment for MRSA infections. Performed at [facility], [place] [address] [place],  [address]     Time coordinating discharge: Approximately 40 minutes  Patrecia Pour, MD  [facility] 06/03/2018, 12:24 PM Pager [phone]

## 2018-06-03 NOTE — Telephone Encounter (Signed)
Called and spoke to patients wife 06/03/18 to schedule Hopsital FU for patient no more than 7 days after discharge. She stated they were still at the hospital and that she would call back to schedule appt when they got home and she was able to check her schedule.

## 2018-06-03 NOTE — Anesthesia Postprocedure Evaluation (Addendum)
Anesthesia Post Note  Patient: [name]  Procedure(s) Performed: ENTEROSCOPY (N/A ) HOT HEMOSTASIS (ARGON PLASMA COAGULATION/BICAP) (N/A )     Patient location during evaluation: Endoscopy Anesthesia Type: MAC Level of consciousness: awake and alert, oriented and patient cooperative Pain management: pain level controlled Vital Signs Assessment: post-procedure vital signs reviewed and stable Respiratory status: spontaneous breathing, nonlabored ventilation and respiratory function stable Cardiovascular status: blood pressure returned to baseline and stable Postop Assessment: no apparent nausea or vomiting Anesthetic complications: no    Last Vitals:  Vitals:   06/03/18 0917 06/03/18 1012  BP: (!) 101/52 134/71  Pulse: 74 72  Resp: (!) 23 18  Temp:  36.5 C  SpO2: 100% 100%    Last Pain:  Vitals:   06/03/18 1100  TempSrc:   PainSc: 3                  Logon Uttech,E. Jw Covin

## 2018-06-03 NOTE — Transfer of Care (Signed)
Immediate Anesthesia Transfer of Care Note  Patient: [name]  Procedure(s) Performed: ENTEROSCOPY (N/A ) HOT HEMOSTASIS (ARGON PLASMA COAGULATION/BICAP) (N/A )  Patient Location: Endoscopy Unit  Anesthesia Type:MAC  Level of Consciousness: awake and alert   Airway & Oxygen Therapy: Patient Spontanous Breathing  Post-op Assessment: Report given to RN and Post -op Vital signs reviewed and stable  Post vital signs: Reviewed and stable  Last Vitals:  Vitals Value Taken Time  BP 100/53 06/03/2018  9:06 AM  Temp 36.6 C 06/03/2018  9:04 AM  Pulse 70 06/03/2018  9:13 AM  Resp 15 06/03/2018  9:13 AM  SpO2 100 % 06/03/2018  9:13 AM  Vitals shown include unvalidated device data.  Last Pain:  Vitals:   06/03/18 0904  TempSrc: Oral  PainSc:       Patients Stated Pain Goal: 0 (84/03/97 9536)  Complications: No apparent anesthesia complications

## 2018-06-03 NOTE — Op Note (Signed)
[facility] Patient Name: [name] Procedure Date : 06/03/2018 MRN: [id number] Attending MD: Gerrit Heck , MD Date of Birth: 04-18-1941 CSN: [id number] Age: 77 Admit Type: Inpatient Procedure:                Small bowel enteroscopy Indications:              Anemia, Melena                           77 yo male with a history of ESRD admitted with                            melena and anemia requiring pRBCs yesterday. Providers:                Gerrit Heck, MD, Baird Cancer, RN, Cherylynn Ridges, Technician, Edmonia James, CRNA Referring MD:              Medicines:                Monitored Anesthesia Care Complications:            No immediate complications. Estimated Blood Loss:     Estimated blood loss: none. Procedure:                Pre-Anesthesia Assessment:                           - Prior to the procedure, a History and Physical                            was performed, and patient medications and                            allergies were reviewed. The patient's tolerance of                            previous anesthesia was also reviewed. The risks                            and benefits of the procedure and the sedation                            options and risks were discussed with the patient.                            All questions were answered, and informed consent                            was obtained. Prior Anticoagulants: The patient has                            taken aspirin, last dose was 2 days prior to  procedure. ASA Grade Assessment: III - A patient                            with severe systemic disease. After reviewing the                            risks and benefits, the patient was deemed in                            satisfactory condition to undergo the procedure.                           After obtaining informed consent, the endoscope was                            passed  under direct vision. Throughout the                            procedure, the patient's blood pressure, pulse, and                            oxygen saturations were monitored continuously. The                            [id number] ([id number]) peds colon was introduced                            through the mouth and advanced to the mid-jejunum.                            The small bowel enteroscopy was accomplished                            without difficulty. The patient tolerated the                            procedure well. Scope In: Scope Out: Findings:      There was no evidence of significant pathology in the entire examined       portion of jejunum.      There was no evidence of significant pathology in the duodenal bulb, in       the first portion of the duodenum, in the second portion of the       duodenum, in the third portion of the duodenum and in the fourth portion       of the duodenum.      The examined esophagus was normal.      A single 3 mm stigmata of recent bleeding angioectasia was found in the       gastric fundus. Coagulation for hemostasis using argon plasma at 1       liter/minute and 20 watts was successful. Estimated blood loss: none.      The gastric body, gastric antrum and pylorus were normal. Impression:               - The examined portion of the jejunum was normal.                           -  Normal duodenal bulb, first portion of the                            duodenum, second portion of the duodenum, third                            portion of the duodenum and fourth portion of the                            duodenum.                           - Normal esophagus.                           - A single recently bleeding angioectasia in the                            stomach. Treated with argon plasma coagulation                            (APC).                           - Normal gastric body, antrum and pylorus.                           - No  specimens collected. Recommendation:           - Return patient to hospital ward for ongoing care.                           - Resume full liquid diet now then can advance to                            previous diet later today.                           - Continue present medications. Procedure Code(s):        --- Professional ---                           [phone], Small intestinal endoscopy, enteroscopy                            beyond second portion of duodenum, not including                            ileum; with control of bleeding (eg, injection,                            bipolar cautery, unipolar cautery, laser, heater                            probe, stapler, plasma coagulator) Diagnosis Code(s):        --- Professional ---  K31.811, Angiodysplasia of stomach and duodenum                            with bleeding                           D64.9, Anemia, unspecified                           K92.1, Melena (includes Hematochezia) CPT copyright 2017 American Medical Association. All rights reserved. The codes documented in this report are preliminary and upon coder review may  be revised to meet current compliance requirements. Gerrit Heck, MD 06/03/2018 9:27:19 AM Number of Addenda: 0

## 2018-06-04 ENCOUNTER — Encounter (HOSPITAL_COMMUNITY): Payer: Self-pay | Admitting: Gastroenterology

## 2018-06-04 DIAGNOSIS — D631 Anemia in chronic kidney disease: Secondary | ICD-10-CM | POA: Diagnosis not present

## 2018-06-04 DIAGNOSIS — E1129 Type 2 diabetes mellitus with other diabetic kidney complication: Secondary | ICD-10-CM | POA: Diagnosis not present

## 2018-06-04 DIAGNOSIS — K2971 Gastritis, unspecified, with bleeding: Secondary | ICD-10-CM | POA: Insufficient documentation

## 2018-06-04 DIAGNOSIS — N186 End stage renal disease: Secondary | ICD-10-CM | POA: Diagnosis not present

## 2018-06-04 DIAGNOSIS — D509 Iron deficiency anemia, unspecified: Secondary | ICD-10-CM | POA: Diagnosis not present

## 2018-06-04 DIAGNOSIS — N2581 Secondary hyperparathyroidism of renal origin: Secondary | ICD-10-CM | POA: Diagnosis not present

## 2018-06-06 ENCOUNTER — Ambulatory Visit
Admission: RE | Admit: 2018-06-06 | Discharge: 2018-06-06 | Disposition: A | Discharge status: Home / Self Care | Payer: Medicare Other | Source: Ambulatory Visit | Attending: Internal Medicine | Admitting: Internal Medicine

## 2018-06-06 ENCOUNTER — Encounter: Payer: Self-pay | Admitting: Internal Medicine

## 2018-06-06 ENCOUNTER — Other Ambulatory Visit: Payer: Medicare Other

## 2018-06-06 ENCOUNTER — Ambulatory Visit (INDEPENDENT_AMBULATORY_CARE_PROVIDER_SITE_OTHER): Payer: Medicare Other | Admitting: Internal Medicine

## 2018-06-06 VITALS — BP 110/80 | HR 69 | Ht 71.0 in | Wt 158.0 lb

## 2018-06-06 DIAGNOSIS — I10 Essential (primary) hypertension: Secondary | ICD-10-CM | POA: Diagnosis not present

## 2018-06-06 DIAGNOSIS — S22000A Wedge compression fracture of unspecified thoracic vertebra, initial encounter for closed fracture: Secondary | ICD-10-CM

## 2018-06-06 DIAGNOSIS — N186 End stage renal disease: Secondary | ICD-10-CM

## 2018-06-06 DIAGNOSIS — R195 Other fecal abnormalities: Secondary | ICD-10-CM | POA: Diagnosis not present

## 2018-06-06 DIAGNOSIS — K5520 Angiodysplasia of colon without hemorrhage: Secondary | ICD-10-CM | POA: Diagnosis not present

## 2018-06-06 DIAGNOSIS — E1129 Type 2 diabetes mellitus with other diabetic kidney complication: Secondary | ICD-10-CM | POA: Diagnosis not present

## 2018-06-06 DIAGNOSIS — R413 Other amnesia: Secondary | ICD-10-CM | POA: Diagnosis not present

## 2018-06-06 DIAGNOSIS — E119 Type 2 diabetes mellitus without complications: Secondary | ICD-10-CM

## 2018-06-06 DIAGNOSIS — K922 Gastrointestinal hemorrhage, unspecified: Secondary | ICD-10-CM

## 2018-06-06 DIAGNOSIS — R0781 Pleurodynia: Secondary | ICD-10-CM

## 2018-06-06 DIAGNOSIS — E7849 Other hyperlipidemia: Secondary | ICD-10-CM

## 2018-06-06 DIAGNOSIS — D509 Iron deficiency anemia, unspecified: Secondary | ICD-10-CM | POA: Diagnosis not present

## 2018-06-06 DIAGNOSIS — Z992 Dependence on renal dialysis: Secondary | ICD-10-CM | POA: Diagnosis not present

## 2018-06-06 DIAGNOSIS — N2581 Secondary hyperparathyroidism of renal origin: Secondary | ICD-10-CM | POA: Diagnosis not present

## 2018-06-06 DIAGNOSIS — IMO0001 Reserved for inherently not codable concepts without codable children: Secondary | ICD-10-CM

## 2018-06-06 DIAGNOSIS — E538 Deficiency of other specified B group vitamins: Secondary | ICD-10-CM

## 2018-06-06 DIAGNOSIS — Z794 Long term (current) use of insulin: Secondary | ICD-10-CM

## 2018-06-06 DIAGNOSIS — D631 Anemia in chronic kidney disease: Secondary | ICD-10-CM | POA: Diagnosis not present

## 2018-06-06 LAB — CBC WITH DIFFERENTIAL/PLATELET
Basophils Absolute: 58 cells/uL (ref 0–200)
Basophils Relative: 0.7 %
Eosinophils Absolute: 249 cells/uL (ref 15–500)
Eosinophils Relative: 3 %
HCT: 29 % — ABNORMAL LOW (ref 38.5–50.0)
Hemoglobin: 9.4 g/dL — ABNORMAL LOW (ref 13.2–17.1)
Lymphs Abs: 2266 cells/uL (ref 850–3900)
MCH: 31.8 pg (ref 27.0–33.0)
MCHC: 32.4 g/dL (ref 32.0–36.0)
MCV: 98 fL (ref 80.0–100.0)
MPV: 9.8 fL (ref 7.5–12.5)
Monocytes Relative: 9.3 %
Neutro Abs: 4955 cells/uL (ref 1500–7800)
Neutrophils Relative %: 59.7 %
Platelets: 301 10*3/uL (ref 140–400)
RBC: 2.96 10*6/uL — ABNORMAL LOW (ref 4.20–5.80)
RDW: 17.8 % — ABNORMAL HIGH (ref 11.0–15.0)
Total Lymphocyte: 27.3 %
WBC mixed population: 772 cells/uL (ref 200–950)
WBC: 8.3 10*3/uL (ref 3.8–10.8)

## 2018-06-09 ENCOUNTER — Ambulatory Visit: Payer: Medicare Other | Admitting: Internal Medicine

## 2018-06-09 DIAGNOSIS — D509 Iron deficiency anemia, unspecified: Secondary | ICD-10-CM | POA: Diagnosis not present

## 2018-06-09 DIAGNOSIS — N186 End stage renal disease: Secondary | ICD-10-CM | POA: Diagnosis not present

## 2018-06-09 DIAGNOSIS — E1129 Type 2 diabetes mellitus with other diabetic kidney complication: Secondary | ICD-10-CM | POA: Diagnosis not present

## 2018-06-09 DIAGNOSIS — N2581 Secondary hyperparathyroidism of renal origin: Secondary | ICD-10-CM | POA: Diagnosis not present

## 2018-06-09 DIAGNOSIS — D631 Anemia in chronic kidney disease: Secondary | ICD-10-CM | POA: Diagnosis not present

## 2018-06-10 ENCOUNTER — Telehealth: Payer: Self-pay | Admitting: Endocrinology

## 2018-06-10 NOTE — Telephone Encounter (Signed)
Did not see any future lab orders please advise

## 2018-06-10 NOTE — Telephone Encounter (Signed)
Patient has an appointment 06/12/18. Can he get labs drawn prior to the appointment?

## 2018-06-11 ENCOUNTER — Other Ambulatory Visit: Payer: Self-pay | Admitting: Endocrinology

## 2018-06-11 DIAGNOSIS — D509 Iron deficiency anemia, unspecified: Secondary | ICD-10-CM | POA: Diagnosis not present

## 2018-06-11 DIAGNOSIS — D631 Anemia in chronic kidney disease: Secondary | ICD-10-CM | POA: Diagnosis not present

## 2018-06-11 DIAGNOSIS — E1129 Type 2 diabetes mellitus with other diabetic kidney complication: Secondary | ICD-10-CM | POA: Diagnosis not present

## 2018-06-11 DIAGNOSIS — N186 End stage renal disease: Secondary | ICD-10-CM | POA: Diagnosis not present

## 2018-06-11 DIAGNOSIS — N2581 Secondary hyperparathyroidism of renal origin: Secondary | ICD-10-CM | POA: Diagnosis not present

## 2018-06-11 NOTE — Telephone Encounter (Signed)
Does not need labs, all up-to-date

## 2018-06-11 NOTE — Telephone Encounter (Signed)
No answer and unable to LVM

## 2018-06-12 ENCOUNTER — Ambulatory Visit (INDEPENDENT_AMBULATORY_CARE_PROVIDER_SITE_OTHER): Payer: Medicare Other | Admitting: Endocrinology

## 2018-06-12 ENCOUNTER — Encounter: Payer: Self-pay | Admitting: Endocrinology

## 2018-06-12 ENCOUNTER — Telehealth: Payer: Self-pay | Admitting: Gastroenterology

## 2018-06-12 VITALS — BP 116/67 | HR 71 | Temp 97.5°F | Ht 71.0 in | Wt 155.8 lb

## 2018-06-12 DIAGNOSIS — E1165 Type 2 diabetes mellitus with hyperglycemia: Secondary | ICD-10-CM

## 2018-06-12 DIAGNOSIS — Z23 Encounter for immunization: Secondary | ICD-10-CM | POA: Diagnosis not present

## 2018-06-12 DIAGNOSIS — N186 End stage renal disease: Secondary | ICD-10-CM | POA: Diagnosis not present

## 2018-06-12 DIAGNOSIS — Z992 Dependence on renal dialysis: Secondary | ICD-10-CM | POA: Diagnosis not present

## 2018-06-12 NOTE — Patient Instructions (Signed)
Check on Calcium tabs

## 2018-06-12 NOTE — Telephone Encounter (Signed)
Dr. Bryan Lemma will you approve of the switch?

## 2018-06-12 NOTE — Telephone Encounter (Signed)
sure

## 2018-06-12 NOTE — Progress Notes (Signed)
Patient ID: [name], male   DOB: 06-22-1941, 77 y.o.   MRN: [id number]           Reason for Appointment:  Follow-up for endocrinology problems   History of Present Illness:          Date of diagnosis of type 2 diabetes mellitus:  1996      Background history:  He has had long-standing diabetes probably treated with metformin initially and subsequently with sulfonylurea drugs At some point he was also given Actos in addition to his glipizide which he is still taking Records of his control are only available for the last 4 years Over the last few years his A1c has been generally in the 6-7% range His A1c was 6.2 in May 2016 and apparently was starting to get low normal blood sugars at times He was admitted to the hospital for fluid overload and renal failure and at that time his Actos was stopped Subsequently his blood sugars had been markedly increased He was switched from regular insulin to NovoLog on his initial consultation  Recent history:    Non-insulin hypoglycemic drugs: Victoza 0.6 mg daily  His A1c is 5.2 now compared to 6.3    Current blood sugar patterns and problems identified:  He has been  on Victoza instead of Tradjenta 5 mg daily since his visit in 7/19  This was started to help with postprandial high blood sugars along with the finding of rapid gastric emptying on his scan and tendency to diarrhea  He has no side effects from this and overall blood sugars appear to be fairly good  He still has some high postprandial readings up to about 180 at night but checking infrequently and very few this month  He has no GI symptoms or nausea and weight is stable  FASTING blood sugars have been excellent ranging from 90 up to 127, checked infrequently again  Blood sugars were mostly fairly good during his recent hospitalization  He has had significant anemia and this may be affecting his A1c results   Dialysis is done at 7 am, doing it on  Monday/Wednesday/Fridays     Side effects from medications have been: None  Compliance with the medical regimen: Fair  Glucose monitoring:  done 0-1  times a day         Glucometer:  Freestyle  Blood Glucose readings by review of monitor: Download as above  Average 127 for the last 30 days    Self-care: The diet that the patient has been following is: tries to limit sweets, portions. eating out only about once a week at a steak house        Typical meal intake: Breakfast is variable; supper 8 pm            Dietician visit, most recent: 08/2017               Exercise:  Not able to do any, in wheelchair  Weight history: 157  Wt Readings from Last 3 Encounters:  06/12/18 155 lb 12.8 oz (70.7 kg)  06/06/18 158 lb 0.1 oz (71.7 kg)  06/02/18 156 lb 12 oz (71.1 kg)    Glycemic control:    Lab Results  Component Value Date   HGBA1C 5.2 06/01/2018   HGBA1C 6.3 04/17/2018   HGBA1C 5.4 01/18/2018   Lab Results  Component Value Date   MICROALBUR 37.2 09/03/2016   LDLCALC 21 09/05/2017   CREATININE 6.68 (H) 06/02/2018    Lab  Results  Component Value Date   FRUCTOSAMINE 289 (H) 04/17/2018   FRUCTOSAMINE 272 02/20/2018   FRUCTOSAMINE 454 (H) 07/12/2017    PROBLEM 2 Osteoporosis: See review of systems       Allergies as of 06/12/2018      Reactions   Penicillins Rash   Has patient had a PCN reaction causing immediate rash, facial/tongue/throat swelling, SOB or lightheadedness with hypotension: Yes Has patient had a PCN reaction causing severe rash involving mucus membranes or skin necrosis: Yes Has patient had a PCN reaction that required hospitalization: No Has patient had a PCN reaction occurring within the last 10 years: No If all of the above answers are "NO", then may proceed with Cephalosporin use.      Medication List        Accurate as of 06/12/18  8:43 PM. Always use your most recent med list.          acetaminophen 500 MG tablet Commonly known as:   TYLENOL Take 1,000 mg by mouth 2 (two) times daily as needed (for pain).   allopurinol 100 MG tablet Commonly known as:  ZYLOPRIM Take 100 mg by mouth daily as needed (pain).   aspirin 325 MG tablet Take 325 mg by mouth daily.   Carboxymethylcellulose Sod PF 0.25 % Soln Place 1 drop into both eyes as needed (dry eyes).   clobetasol 0.05 % external solution Commonly known as:  TEMOVATE Apply 1 application topically 2 (two) times daily as needed (irritation). Apply to scalp   denosumab 60 MG/ML Sosy injection Commonly known as:  PROLIA Inject 60 mg into the skin every 6 (six) months.   donepezil 10 MG tablet Commonly known as:  ARICEPT Take 1 tablet (10 mg total) by mouth at bedtime.   Fluocinolone Acetonide Scalp 0.01 % Oil Apply 1 application topically at bedtime. Apply to scalp   fluticasone 0.05 % cream Commonly known as:  CUTIVATE Apply 1 application topically 2 (two) times daily as needed (irritation). Apply to face   fluticasone 50 MCG/ACT nasal spray Commonly known as:  FLONASE Place 1 spray into both nostrils daily.   FREESTYLE FREEDOM LITE w/Device Kit Use to check blood sugar 2 times per day dx code E11.65   freestyle lancets Use as instructed to check blood sugar 2 times per day dx code E11.65   glucose blood test strip Use to check blood sugar 2 times per day. Dx code E11.65   glucose blood test strip Use as instructed to check blood sugar 3 times daily.   Insulin Pen Needle 31G X 5 MM Misc Use with Victoza pen once a day   ketoconazole 2 % shampoo Commonly known as:  NIZORAL Apply 1 application topically 2 (two) times a week.   levothyroxine 50 MCG tablet Commonly known as:  SYNTHROID, LEVOTHROID Take 1 tablet (50 mcg total) by mouth daily.   liraglutide 18 MG/3ML Sopn Commonly known as:  VICTOZA Inject 0.1 mLs (0.6 mg total) into the skin daily.   loratadine 10 MG tablet Commonly known as:  CLARITIN Take 10 mg by mouth daily as needed for  allergies.   midodrine 10 MG tablet Commonly known as:  PROAMATINE Take 10 mg by mouth 3 (three) times daily.   Naftifine HCl 2 % Crea Apply to feet daily for fungal infection   nystatin-triamcinolone cream Commonly known as:  MYCOLOG II Apply 1 application topically 2 (two) times daily.   omeprazole 40 MG capsule Commonly known as:  PRILOSEC Take  1 capsule (40 mg total) by mouth daily.   polyethylene glycol packet Commonly known as:  MIRALAX / GLYCOLAX Take 17 g by mouth daily.   RENVELA 800 MG tablet Generic drug:  sevelamer carbonate Take 800 mg by mouth daily with supper.   simvastatin 20 MG tablet Commonly known as:  ZOCOR Take 20 mg by mouth at bedtime.   vitamin B-12 1000 MCG tablet Commonly known as:  CYANOCOBALAMIN Take 1 tablet (1,000 mcg total) by mouth daily.       Allergies:  Allergies  Allergen Reactions  . Penicillins Rash    Has patient had a PCN reaction causing immediate rash, facial/tongue/throat swelling, SOB or lightheadedness with hypotension: Yes Has patient had a PCN reaction causing severe rash involving mucus membranes or skin necrosis: Yes Has patient had a PCN reaction that required hospitalization: No Has patient had a PCN reaction occurring within the last 10 years: No If all of the above answers are "NO", then may proceed with Cephalosporin use.     Past Medical History:  Diagnosis Date  . Allergy   . Anemia   . Arthritis   . Cataract    bil cateracts removed  . Coronary artery disease   . Diabetes mellitus    Type 2  . Diverticulitis   . ED (erectile dysfunction)   . 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 ESRD (end stage renal disease) on dialysis ([place]) 03/2015  . GERD (gastroesophageal reflux disease)    pepto   . Gout   . Hyperlipidemia   . Hypertension   . Hypothyroidism   . Pneumonia   . PVD (peripheral vascular disease) ([place])    has plastic aorta  . Renal insufficiency   . Seasonal allergies   . Shortness of breath  dyspnea   . Sleep apnea    does not wear c-pap  . Thyroid disease     Past Surgical History:  Procedure Laterality Date  . aortobifemoral bypass    . AV FISTULA PLACEMENT Left 12/01/2013   Procedure: ARTERIOVENOUS (AV) FISTULA CREATION- LEFT BRACHIOCEPHALIC;  Surgeon: Angelia Mould, MD;  Location: [place];  Service: Vascular;  Laterality: Left;  . [place] TRANSPOSITION Right 07/27/2014   Procedure: BASCILIC VEIN TRANSPOSITION;  Surgeon: Angelia Mould, MD;  Location: [place];  Service: Vascular;  Laterality: Right;  . BREAST SURGERY     left - granulomatous mastitis  . COLONOSCOPY    . 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 ENTEROSCOPY N/A 06/03/2018   Procedure: ENTEROSCOPY;  Surgeon: Lavena Bullion, DO;  Location: [place];  Service: Gastroenterology;  Laterality: N/A;  . EYE SURGERY Bilateral    cataracts  . HEMODIALYSIS INPATIENT  01/17/2018      . HOT HEMOSTASIS N/A 06/03/2018   Procedure: HOT HEMOSTASIS (ARGON PLASMA COAGULATION/BICAP);  Surgeon: Lavena Bullion, DO;  Location: [facility] ENDOSCOPY;  Service: Gastroenterology;  Laterality: N/A;  . REVISON OF ARTERIOVENOUS FISTULA Left 02/09/2014   Procedure: REVISON OF LEFT ARTERIOVENOUS FISTULA - RESECTION OF RENDUNDANT VEIN;  Surgeon: Angelia Mould, MD;  Location: [place];  Service: Vascular;  Laterality: Left;  . SBO with lysis adhesions    . SHUNTOGRAM Left 04/19/2014   Procedure: FISTULOGRAM;  Surgeon: Angelia Mould, MD;  Location: [facility] CATH LAB;  Service: Cardiovascular;  Laterality: Left;  . UNILATERAL UPPER EXTREMEITY ANGIOGRAM N/A 07/12/2014   Procedure: UNILATERAL UPPER Anselmo Rod;  Surgeon: Angelia Mould, MD;  Location: [facility]  CATH LAB;  Service: Cardiovascular;  Laterality: N/A;    Family History  Problem Relation Age of Onset  . Aneurysm Mother   . Heart disease Father   . Stroke Father   . Hypertension Father   . Diabetes Father   . Dementia Neg Hx   .  Colon cancer Neg Hx   . Esophageal cancer Neg Hx   . Pancreatic cancer Neg Hx   . Prostate cancer Neg Hx   . Rectal cancer Neg Hx   . Stomach cancer Neg Hx     Social History:  reports that he quit smoking about 23 years ago. He has never used smokeless tobacco. He reports that he does not drink alcohol or use drugs.    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 he is not a candidate for Forteo and nephrologist suggested Prolia He has had his first injection of Prolia this year in 5/19 No recent back pain  Also on his previous visit was having hypocalcemia and not clear if he is taking any calcium supplements which were recommended on the last visit Calcium corrected for his albumin is normal Not apparently taking any Sensipar currently  Lab Results  Component Value Date   PTH 154 (H) 10/06/2014   CALCIUM 8.7 (L) 06/02/2018   CAION 1.44 (H) 07/12/2014   PHOS 4.2 01/10/2016     Lipid history: He has been treated with Simvastatin 20 mg  by PCP, results as follows    Lab Results  Component Value Date   CHOL 79 09/05/2017   HDL 38.00 (L) 09/05/2017   LDLCALC 21 09/05/2017   TRIG 99.0 09/05/2017   CHOLHDL 2 09/05/2017          He has ESRD on dialysis    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  Mild hypothyroidism ?: On 50 mcg levothyroxine with normal TSH levels  Lab Results  Component Value Date   TSH 0.90 02/20/2018       Physical Examination:  BP 116/67 (BP Location: Left Arm, Patient Position: Sitting, Cuff Size: Normal)   Pulse 71   Temp (!) 97.5 F (36.4 C) (Oral)   Ht _0  (1.803 m)   Wt 155 lb 12.8 oz (70.7 kg)   SpO2 98%   BMI 21.73 kg/m       ASSESSMENT:  Diabetes type 2, recent BMI 22  See history of present illness for  discussion of his current management, blood sugar patterns and problems identified  His blood sugars are generally well controlled and again doing fine without any insulin his weight loss A1c is 5.2 This is likely to be low because of his significant anemia recently along with blood transfusion  Currently getting by with only 0.6 mg of Victoza and occasional high postprandial readings Not clear if this has been instrumental in helping his GI symptoms, previously had rapid gastric emptying and diarrhea problems which are better   OSTEOPOROSIS: Started Prolia and he will be due for another injection in November No recent hypocalcemia Calcium supplements: His wife will check to see what supplements he is taking as recommended by nephrologist     PLAN:   No change in current regimen of diabetes and osteoporosis treatment Encouraged him to alternate  fasting and after meal readings when he does check his glucose He will let us know if he has any difficulty with using his glucometer  Patient Instructions  Check on Calcium tabs         Elayne Snare 06/12/2018, 8:43 PM   Note: This office note was prepared with Dragon voice recognition system technology. Any transcriptional errors that result from this process are unintentional.

## 2018-06-13 DIAGNOSIS — D631 Anemia in chronic kidney disease: Secondary | ICD-10-CM | POA: Diagnosis not present

## 2018-06-13 DIAGNOSIS — E1129 Type 2 diabetes mellitus with other diabetic kidney complication: Secondary | ICD-10-CM | POA: Diagnosis not present

## 2018-06-13 DIAGNOSIS — N186 End stage renal disease: Secondary | ICD-10-CM | POA: Diagnosis not present

## 2018-06-13 DIAGNOSIS — N2581 Secondary hyperparathyroidism of renal origin: Secondary | ICD-10-CM | POA: Diagnosis not present

## 2018-06-13 DIAGNOSIS — D509 Iron deficiency anemia, unspecified: Secondary | ICD-10-CM | POA: Diagnosis not present

## 2018-06-16 ENCOUNTER — Ambulatory Visit: Payer: Medicare Other | Admitting: Gastroenterology

## 2018-06-16 DIAGNOSIS — N186 End stage renal disease: Secondary | ICD-10-CM | POA: Diagnosis not present

## 2018-06-16 DIAGNOSIS — D509 Iron deficiency anemia, unspecified: Secondary | ICD-10-CM | POA: Diagnosis not present

## 2018-06-16 DIAGNOSIS — D631 Anemia in chronic kidney disease: Secondary | ICD-10-CM | POA: Diagnosis not present

## 2018-06-16 DIAGNOSIS — N2581 Secondary hyperparathyroidism of renal origin: Secondary | ICD-10-CM | POA: Diagnosis not present

## 2018-06-16 DIAGNOSIS — E1129 Type 2 diabetes mellitus with other diabetic kidney complication: Secondary | ICD-10-CM | POA: Diagnosis not present

## 2018-06-17 ENCOUNTER — Encounter: Payer: Self-pay | Admitting: Physician Assistant

## 2018-06-17 ENCOUNTER — Ambulatory Visit (INDEPENDENT_AMBULATORY_CARE_PROVIDER_SITE_OTHER): Payer: Medicare Other | Admitting: Physician Assistant

## 2018-06-17 ENCOUNTER — Other Ambulatory Visit (INDEPENDENT_AMBULATORY_CARE_PROVIDER_SITE_OTHER): Payer: Medicare Other

## 2018-06-17 VITALS — BP 118/64 | HR 62 | Ht 71.0 in | Wt 157.0 lb

## 2018-06-17 DIAGNOSIS — K219 Gastro-esophageal reflux disease without esophagitis: Secondary | ICD-10-CM | POA: Diagnosis not present

## 2018-06-17 DIAGNOSIS — K31819 Angiodysplasia of stomach and duodenum without bleeding: Secondary | ICD-10-CM

## 2018-06-17 DIAGNOSIS — D649 Anemia, unspecified: Secondary | ICD-10-CM

## 2018-06-17 LAB — CBC WITH DIFFERENTIAL/PLATELET
Basophils Absolute: 0.1 10*3/uL (ref 0.0–0.1)
Basophils Relative: 1.3 % (ref 0.0–3.0)
Eosinophils Absolute: 0.2 10*3/uL (ref 0.0–0.7)
Eosinophils Relative: 3.2 % (ref 0.0–5.0)
HCT: 32.1 % — ABNORMAL LOW (ref 39.0–52.0)
Hemoglobin: 10.4 g/dL — ABNORMAL LOW (ref 13.0–17.0)
Lymphocytes Relative: 29.1 % (ref 12.0–46.0)
Lymphs Abs: 2.3 10*3/uL (ref 0.7–4.0)
MCHC: 32.3 g/dL (ref 30.0–36.0)
MCV: 101.9 fl — ABNORMAL HIGH (ref 78.0–100.0)
Monocytes Absolute: 0.7 10*3/uL (ref 0.1–1.0)
Monocytes Relative: 8.5 % (ref 3.0–12.0)
Neutro Abs: 4.5 10*3/uL (ref 1.4–7.7)
Neutrophils Relative %: 57.9 % (ref 43.0–77.0)
Platelets: 235 10*3/uL (ref 150.0–400.0)
RBC: 3.15 Mil/uL — ABNORMAL LOW (ref 4.22–5.81)
RDW: 23.1 % — ABNORMAL HIGH (ref 11.5–15.5)
WBC: 7.8 10*3/uL (ref 4.0–10.5)

## 2018-06-17 NOTE — Patient Instructions (Signed)
Your provider has requested that you go to the basement level for lab work before leaving today. Press "B" on the elevator. The lab is located at the first door on the left as you exit the elevator.  Normal BMI (Body Mass Index- based on height and weight) is between 23 and 30. Your BMI today is Body mass index is 21.9 kg/m. Marland Kitchen Please consider follow up  regarding your BMI with your Primary Care Provider.

## 2018-06-17 NOTE — Progress Notes (Signed)
Subjective:    Patient ID: [name], male    DOB: August 02, 1941, 77 y.o.   MRN: [id number]  HPI "[name]" is a pleasant 77 year old African-American male, established with Dr. 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   He was hospitalized 06/01/2018 with anemia and tarry stools is not anticoagulated but takes a regular strength aspirin daily. He had had fairly recent EGD and colonoscopy February 2019.  EGD with finding of a mild distal esophageal stricture which was dilated and otherwise negative exam.  Colonoscopy in February with finding of tubular adenomatous polyp and diverticulosis.  He underwent enteroscopy per Dr. Bryan Lemma during hospitalization finding of 1 3 mm gastric AVM in the fundus which was treated with APC, study was otherwise negative into the jejunum.  Fine His baseline hemoglobin is 9-9-1/2.   He did receive 2 units of packed RBCs during hospitalization and hemoglobin was 9.4 on discharge. He has not had any further dark or black stools since discharge. He takes Benefiber daily and says he has 2-3 bowel movements per day generally and His energy level has been "okay". He mentions that over the past week or so he has been having some crampy lower abdominal discomfort that he describes as burning in nature.  He says when he sits down to have a bowel movement that he has been passing a little bit of urine which burns as well.  Defecation seems to decrease the abdominal discomfort which then goes away until the next time he needs to have a bowel movement.  Not had any fever or chills.  Generally does not make more than a few drops of urine at a time.  Apparently he had seen a urologist within the past few months for similar symptoms and he thinks was treated with an antibiotic which  resolved his symptoms.  Review of Systems Pertinent positive and negative review of systems were noted in the above HPI section.  All other review of systems was otherwise negative.  Outpatient Encounter Medications as of 06/17/2018  Medication Sig  . acetaminophen (TYLENOL) 500 MG tablet Take 1,000 mg by mouth 2 (two) times daily as needed (for pain).   Marland Kitchen allopurinol (ZYLOPRIM) 100 MG tablet Take 100 mg by mouth daily as needed (pain).   Marland Kitchen aspirin 325 MG tablet Take 325 mg by mouth daily.   . Blood Glucose Monitoring Suppl (FREESTYLE FREEDOM LITE) w/Device KIT Use to check blood sugar 2 times per day dx code E11.65  . Carboxymethylcellulose Sod PF 0.25 % SOLN Place 1 drop into both eyes as needed (dry eyes).  . clobetasol (TEMOVATE) 0.05 % external solution Apply 1 application topically 2 (two) times daily as needed (irritation). Apply to scalp  . denosumab (PROLIA) 60 MG/ML SOSY injection Inject 60 mg into the skin every 6 (six) months.  . donepezil (ARICEPT) 10 MG tablet Take 1 tablet (10 mg total) by mouth at bedtime.  Marland Kitchen FLUOCINOLONE ACETONIDE SCALP 0.01 % OIL Apply 1 application topically at bedtime. Apply to scalp  . fluticasone (CUTIVATE) 0.05 % cream Apply 1 application topically 2 (two) times daily as needed (irritation). Apply to face  . fluticasone (FLONASE) 50 MCG/ACT nasal spray Place 1 spray into both nostrils daily. (Patient taking differently: Place 1 spray into both nostrils daily as needed for allergies. )  . glucose blood (FREESTYLE LITE) test strip  Use as instructed to check blood sugar 3 times daily.  Marland Kitchen glucose blood (ONETOUCH VERIO) test strip Use to check blood sugar 2 times per day. Dx code E11.65  . Insulin Pen Needle 31G X 5 MM MISC Use with Victoza pen once a day  . ketoconazole (NIZORAL) 2 % shampoo Apply 1 application topically 2 (two) times a week. (Patient taking differently: Apply 1 application topically at bedtime. )  . Lancets (FREESTYLE) lancets Use as  instructed to check blood sugar 2 times per day dx code E11.65  . levothyroxine (SYNTHROID, LEVOTHROID) 50 MCG tablet Take 1 tablet (50 mcg total) by mouth daily.  Marland Kitchen liraglutide (VICTOZA) 18 MG/3ML SOPN Inject 0.1 mLs (0.6 mg total) into the skin daily. (Patient taking differently: Inject 0.6 mg into the skin at bedtime. )  . loratadine (CLARITIN) 10 MG tablet Take 10 mg by mouth daily as needed for allergies.   . midodrine (PROAMATINE) 10 MG tablet Take 10 mg by mouth 3 (three) times daily.  . Naftifine HCl (NAFTIN) 2 % CREA Apply to feet daily for fungal infection (Patient taking differently: Apply 1 application topically daily as needed (for fungal infection). Apply to feet)  . nystatin-triamcinolone (MYCOLOG II) cream Apply 1 application topically 2 (two) times daily.  Marland Kitchen omeprazole (PRILOSEC) 40 MG capsule Take 1 capsule (40 mg total) by mouth daily.  . polyethylene glycol (MIRALAX / GLYCOLAX) packet Take 17 g by mouth daily.  Marland Kitchen RENVELA 800 MG tablet Take 800 mg by mouth daily with supper.   . simvastatin (ZOCOR) 20 MG tablet Take 20 mg by mouth at bedtime.   . vitamin B-12 (CYANOCOBALAMIN) 1000 MCG tablet Take 1 tablet (1,000 mcg total) by mouth daily.   No facility-administered encounter medications on file as of 06/17/2018.    Allergies  Allergen Reactions  . Penicillins Rash    Has patient had a PCN reaction causing immediate rash, facial/tongue/throat swelling, SOB or lightheadedness with hypotension: Yes Has patient had a PCN reaction causing severe rash involving mucus membranes or skin necrosis: Yes Has patient had a PCN reaction that required hospitalization: No Has patient had a PCN reaction occurring within the last 10 years: No If all of the above answers are "NO", then may proceed with Cephalosporin use.    Patient Active Problem List   Diagnosis Date Noted  . Angiodysplasia of stomach   . Occult GI bleeding   . GIB (gastrointestinal bleeding) 06/01/2018  . Low back pain  01/17/2018  . Anorexia   . Dyspnea   . Macrocytic anemia 06/04/2016  . Thrombocytopenia ([place]) 06/04/2016  . Exertional dyspnea 06/04/2016  . Arm paresthesia, left 06/04/2016  . Dyspnea on exertion 06/04/2016  . Tenderness of right calf 06/04/2016  . Mild cognitive impairment with memory loss 01/12/2016  . ESRD on dialysis ([place]) 05/17/2015  . Hypoglycemia   . Weakness 02/18/2015  . OSA (obstructive sleep apnea) 02/02/2015  . Hyperkalemia 10/05/2014  . Chronic kidney disease (CKD), stage IV (severe) ([place]) 04/07/2014  . End stage renal disease ([place]) 11/18/2013  . BPH (benign prostatic hyperplasia) 11/22/2012  . Peripheral vascular disease ([place]) 12/24/2011  . Hyperparathyroidism ([place]) 12/24/2011  . Hypothyroidism 12/24/2011  . Allergic rhinitis 12/24/2011  . Erectile dysfunction 12/24/2011  . Insulin dependent diabetes mellitus ([place]) 09/03/2008  . Hyperlipidemia 09/03/2008  . Essential hypertension 08/30/2008  . PANCREATITIS, HX OF 08/30/2008  . RENAL FAILURE, ACUTE, HX OF 08/30/2008  . DIVERTICULOSIS, COLON 06/08/2003   Social History   Socioeconomic History  .  Marital status: Married    Spouse name: [name] Mood  . Number of children: 1  . Years of education: 1  . Highest education level: Not on file  Occupational History  . Occupation: Retired  Scientific laboratory technician  . Financial resource strain: Not on file  . Food insecurity:    Worry: Not on file    Inability: Not on file  . Transportation needs:    Medical: No    Non-medical: No  Tobacco Use  . Smoking status: Former Smoker    Last attempt to quit: 09/24/1994    Years since quitting: 23.7  . Smokeless tobacco: Never Used  Substance and Sexual Activity  . Alcohol use: No    Alcohol/week: 0.0 standard drinks    Comment: Quit Oct. 1977 ("somewhat heavy")  . Drug use: No  . Sexual activity: Not on file  Lifestyle  . Physical activity:    Days per week: 3 days    Minutes per session: 10 min  . Stress: Not at all    Relationships  . Social connections:    Talks on phone: Not on file    Gets together: Not on file    Attends religious service: Not on file    Active member of club or organization: Not on file    Attends meetings of clubs or organizations: Not on file    Relationship status: Not on file  . Intimate partner violence:    Fear of current or ex partner: Not on file    Emotionally abused: Not on file    Physically abused: Not on file    Forced sexual activity: Not on file  Other Topics Concern  . Not on file  Social History Narrative   Lives at home with wife.   Caffeine use: Drinks no soda or tea.    Drinks 1 cup coffee/week   Right handed    [name]'s family history includes Aneurysm in his mother; Diabetes in his father; Heart disease in his father; Hypertension in his father; Stroke in his father.      Objective:    Vitals:   06/17/18 1458  BP: 118/64  Pulse: 62    Physical Exam; well-developed elderly African-American male, accompanied by his wife both pleasant blood pressure 118/64 pulse 62, height 511, weight 157, BMI of 21.9.  HEENT; nontraumatic normocephalic EOMI PERRLA sclera anicteric oral mucosa moist, Cardiovascular ;regular rate and rhythm with S1-S2, Pulmonary clear bilaterally, Abdomen; soft, bowel sounds are present no palpable mass or hepatosplenomegaly is some mild tenderness in the left lower quadrant and across the suprapubic area no guarding or rebound Rectal; exam not done, Extremities ;no clubbing cyanosis or edema skin warm dry, Neuro psych; alert and oriented, grossly nonfocal mood and affect appropriate       Assessment & Plan:   #88 77 year old African-American male with end-stage renal disease on dialysis here today for post hospital follow-up after recent hospitalization with low-grade GI bleeding and melena.  He was found to have one small AVM in the gastric fundus which was APC. Recent endoscopy and colonoscopy had been done in February 2019 at  that time with no sources for GI bleeding other than diverticulosis noted. Has been stable since discharge from the hospital with no evidence of ongoing bleeding. He likely has other small bowel AVMs  #2 acute on chronic anemia #3 end-stage renal disease on dialysis #4.  Insulin-dependent diabetes mellitus #5.  Hypothyroidism #6.  Peripheral vascular disease status post aortobifem bypass #7  1 week history of crampy lower abdominal discomfort with some dysuria and discomfort with bowel movements which resolves after defecation.   Do not believe he has diverticulitis present though possible.  Suspect urinary source.  #8 GERD chronic stable  Plan;  CBC today Is signed a release and have requested his work-up from urology.  Will review and if antibiotic appropriate will prescribe. We reviewed symptoms of signs of recurrent bleeding and anemia and patient is aware he should pay attention to his stools and call here for any signs of recurrent bleeding. In the future consider decreasing his aspirin dose if he has repeat episode of GI bleeding. Continue omeprazole 40 mg p.o. daily.   Darrly Loberg S Romulo Okray PA-C 06/17/2018   Cc: Elby Showers, MD

## 2018-06-18 ENCOUNTER — Other Ambulatory Visit: Payer: Self-pay

## 2018-06-18 DIAGNOSIS — D631 Anemia in chronic kidney disease: Secondary | ICD-10-CM | POA: Diagnosis not present

## 2018-06-18 DIAGNOSIS — N186 End stage renal disease: Secondary | ICD-10-CM | POA: Diagnosis not present

## 2018-06-18 DIAGNOSIS — D509 Iron deficiency anemia, unspecified: Secondary | ICD-10-CM | POA: Diagnosis not present

## 2018-06-18 DIAGNOSIS — N2581 Secondary hyperparathyroidism of renal origin: Secondary | ICD-10-CM | POA: Diagnosis not present

## 2018-06-18 DIAGNOSIS — E1129 Type 2 diabetes mellitus with other diabetic kidney complication: Secondary | ICD-10-CM | POA: Diagnosis not present

## 2018-06-18 DIAGNOSIS — N390 Urinary tract infection, site not specified: Secondary | ICD-10-CM

## 2018-06-18 MED ORDER — CIPROFLOXACIN HCL 500 MG PO TABS: 500.0000 mg | ORAL_TABLET | Freq: Every day | ORAL | 0 refills | Status: AC

## 2018-06-20 ENCOUNTER — Telehealth: Payer: Self-pay

## 2018-06-20 DIAGNOSIS — E1129 Type 2 diabetes mellitus with other diabetic kidney complication: Secondary | ICD-10-CM | POA: Diagnosis not present

## 2018-06-20 DIAGNOSIS — D631 Anemia in chronic kidney disease: Secondary | ICD-10-CM | POA: Diagnosis not present

## 2018-06-20 DIAGNOSIS — N186 End stage renal disease: Secondary | ICD-10-CM | POA: Diagnosis not present

## 2018-06-20 DIAGNOSIS — N2581 Secondary hyperparathyroidism of renal origin: Secondary | ICD-10-CM | POA: Diagnosis not present

## 2018-06-20 DIAGNOSIS — D509 Iron deficiency anemia, unspecified: Secondary | ICD-10-CM | POA: Diagnosis not present

## 2018-06-20 NOTE — Telephone Encounter (Signed)
Patient notified

## 2018-06-21 NOTE — Progress Notes (Signed)
Subjective:    Patient ID: [name], male    DOB: [birth year], 77 y.o.   MRN: [id number]  HPI 77 year old Black Male with history of end-stage kidney disease currently on hemodialysis , followed by Dr. Lorrene Reid at [facility], was admitted to the hospital September 8 through September 10 with complaint of melena and decline in hemoglobin at dialysis over the past 4 weeks.  Was complaining of loose stools, poor oral intake, and abdominal cramping which has been an issue for him for a number of months.  Was complaining of left-sided abdominal pain.  Stool was guaiac positive and hemoglobin was 5.8 g.  CT of the abdomen and pelvis noted diverticulosis without inflammation and progression of 1 of his several lumbar compression fractures.  He was transfused started on PPI and gastroenterology was consulted.  Patient had colonoscopy on September 10.  Small bowel was normal but there was a small hemorrhage overlying an AV malformation which was treated with APC.  Says he feels weak and continues to have malaise and fatigue.  He had endoscopy earlier this year showing diverticulosis, transverse colon erosion, tubular adenoma and hyperplastic polyp as well as mild gastritis with negative H. pylori biopsy.  Was given B12 supplement orally.  Level was 254 and previously 8 months previously had been at 963.  Hemoglobin A1c stable at 5.2%.  Folate was normal.  Hemoglobin today is 9.4 g and on September 10 was 9.1 g.  He has follow-up appointment with GI on September 24.  Wife is struggling to care for him.  She has cut back on her work at the [employer].  She wants to keep him at home as long as she can.  They were supposed to go on vacation to the beach but had to postpone it due to his illness.  She reports that at times he is irritable.  He has a history of memory loss and macrocytic anemia.  Has been evaluated by neurologist in 2016 and 2017.  Was noted to have cerebellar chronic small  vessel ischemia.  His B12 level was initially low in May 2015 and he was started on B12 injections but somehow he was lost to follow-up regarding that.  Dr. Dwyane Dee is his endocrinologist and follows him for insulin-dependent diabetes.  He now has a freestyle Libre glucose monitor.  He has had issues missing appointments with both endocrinology and neurology.  [name] had him on Aricept 10 mg daily.  Additional problems include hypertension hypothyroidism and hyperparathyroidism.  In April he had a fall resulting in new superior endplate compression fractures of L1 and L2.  In December 2018 he had an L3 vertebroplasty.  He is complaining of left rib pain.  X-ray shows no evidence of fracture today.      Review of Systems see above     Objective:   Physical Exam Neck is supple.  No JVD.  Chest clear.  Cardiac exam regular rate and rhythm.  Point tenderness left lateral rib and axillary line.  Some mild tenderness in his abdomen without rebound tenderness.  No lower extremity edema.  He is alert.  Has some issues ambulating.       Assessment & Plan:  Recent hospitalization for GI bleed.  Check CBC today.  Left sided rib pain-check x-ray to make sure he does not have an occult fracture.  Currently on Prolia due to several fractures  End-stage renal disease on hemodialysis  Memory loss-treated with B12 and Aricept  Insulin-dependent diabetes mellitus-followed by Dr. Dwyane Dee  History of gout-treated with allopurinol  Hypothyroidism-stable on thyroid replacement  Allergic rhinitis  GE reflux/gastritis treated with PPI  Hyperlipidemia treated with Zocor  Plan: Currently seems to be stable status post hospitalization for GI bleed with profound anemia requiring transfusion.  CBC checked today.  Follow-up with gastroenterologist September 24.

## 2018-06-23 DIAGNOSIS — D509 Iron deficiency anemia, unspecified: Secondary | ICD-10-CM | POA: Diagnosis not present

## 2018-06-23 DIAGNOSIS — N186 End stage renal disease: Secondary | ICD-10-CM | POA: Diagnosis not present

## 2018-06-23 DIAGNOSIS — N2581 Secondary hyperparathyroidism of renal origin: Secondary | ICD-10-CM | POA: Diagnosis not present

## 2018-06-23 DIAGNOSIS — D631 Anemia in chronic kidney disease: Secondary | ICD-10-CM | POA: Diagnosis not present

## 2018-06-23 DIAGNOSIS — E1129 Type 2 diabetes mellitus with other diabetic kidney complication: Secondary | ICD-10-CM | POA: Diagnosis not present

## 2018-06-24 DIAGNOSIS — E1122 Type 2 diabetes mellitus with diabetic chronic kidney disease: Secondary | ICD-10-CM | POA: Diagnosis not present

## 2018-06-24 DIAGNOSIS — N186 End stage renal disease: Secondary | ICD-10-CM | POA: Diagnosis not present

## 2018-06-24 DIAGNOSIS — Z992 Dependence on renal dialysis: Secondary | ICD-10-CM | POA: Diagnosis not present

## 2018-06-25 DIAGNOSIS — N2581 Secondary hyperparathyroidism of renal origin: Secondary | ICD-10-CM | POA: Diagnosis not present

## 2018-06-25 DIAGNOSIS — N186 End stage renal disease: Secondary | ICD-10-CM | POA: Diagnosis not present

## 2018-06-25 DIAGNOSIS — E1129 Type 2 diabetes mellitus with other diabetic kidney complication: Secondary | ICD-10-CM | POA: Diagnosis not present

## 2018-06-25 DIAGNOSIS — D509 Iron deficiency anemia, unspecified: Secondary | ICD-10-CM | POA: Diagnosis not present

## 2018-06-25 DIAGNOSIS — D631 Anemia in chronic kidney disease: Secondary | ICD-10-CM | POA: Diagnosis not present

## 2018-06-27 DIAGNOSIS — N186 End stage renal disease: Secondary | ICD-10-CM | POA: Diagnosis not present

## 2018-06-27 DIAGNOSIS — D631 Anemia in chronic kidney disease: Secondary | ICD-10-CM | POA: Diagnosis not present

## 2018-06-27 DIAGNOSIS — N2581 Secondary hyperparathyroidism of renal origin: Secondary | ICD-10-CM | POA: Diagnosis not present

## 2018-06-27 DIAGNOSIS — E1129 Type 2 diabetes mellitus with other diabetic kidney complication: Secondary | ICD-10-CM | POA: Diagnosis not present

## 2018-06-27 DIAGNOSIS — D509 Iron deficiency anemia, unspecified: Secondary | ICD-10-CM | POA: Diagnosis not present

## 2018-06-29 NOTE — Progress Notes (Signed)
Reviewed and agree with documentation and assessment and plan. K. Veena Nandigam , MD   

## 2018-06-30 DIAGNOSIS — N186 End stage renal disease: Secondary | ICD-10-CM | POA: Diagnosis not present

## 2018-06-30 DIAGNOSIS — D631 Anemia in chronic kidney disease: Secondary | ICD-10-CM | POA: Diagnosis not present

## 2018-06-30 DIAGNOSIS — E1129 Type 2 diabetes mellitus with other diabetic kidney complication: Secondary | ICD-10-CM | POA: Diagnosis not present

## 2018-06-30 DIAGNOSIS — D509 Iron deficiency anemia, unspecified: Secondary | ICD-10-CM | POA: Diagnosis not present

## 2018-06-30 DIAGNOSIS — N2581 Secondary hyperparathyroidism of renal origin: Secondary | ICD-10-CM | POA: Diagnosis not present

## 2018-07-02 DIAGNOSIS — D631 Anemia in chronic kidney disease: Secondary | ICD-10-CM | POA: Diagnosis not present

## 2018-07-02 DIAGNOSIS — N186 End stage renal disease: Secondary | ICD-10-CM | POA: Diagnosis not present

## 2018-07-02 DIAGNOSIS — N2581 Secondary hyperparathyroidism of renal origin: Secondary | ICD-10-CM | POA: Diagnosis not present

## 2018-07-02 DIAGNOSIS — D509 Iron deficiency anemia, unspecified: Secondary | ICD-10-CM | POA: Diagnosis not present

## 2018-07-02 DIAGNOSIS — E1129 Type 2 diabetes mellitus with other diabetic kidney complication: Secondary | ICD-10-CM | POA: Diagnosis not present

## 2018-07-04 DIAGNOSIS — D509 Iron deficiency anemia, unspecified: Secondary | ICD-10-CM | POA: Diagnosis not present

## 2018-07-04 DIAGNOSIS — N186 End stage renal disease: Secondary | ICD-10-CM | POA: Diagnosis not present

## 2018-07-04 DIAGNOSIS — E1129 Type 2 diabetes mellitus with other diabetic kidney complication: Secondary | ICD-10-CM | POA: Diagnosis not present

## 2018-07-04 DIAGNOSIS — N2581 Secondary hyperparathyroidism of renal origin: Secondary | ICD-10-CM | POA: Diagnosis not present

## 2018-07-04 DIAGNOSIS — D631 Anemia in chronic kidney disease: Secondary | ICD-10-CM | POA: Diagnosis not present

## 2018-07-07 DIAGNOSIS — N2581 Secondary hyperparathyroidism of renal origin: Secondary | ICD-10-CM | POA: Diagnosis not present

## 2018-07-07 DIAGNOSIS — D631 Anemia in chronic kidney disease: Secondary | ICD-10-CM | POA: Diagnosis not present

## 2018-07-07 DIAGNOSIS — N186 End stage renal disease: Secondary | ICD-10-CM | POA: Diagnosis not present

## 2018-07-07 DIAGNOSIS — D509 Iron deficiency anemia, unspecified: Secondary | ICD-10-CM | POA: Diagnosis not present

## 2018-07-07 DIAGNOSIS — E1129 Type 2 diabetes mellitus with other diabetic kidney complication: Secondary | ICD-10-CM | POA: Diagnosis not present

## 2018-07-09 DIAGNOSIS — N2581 Secondary hyperparathyroidism of renal origin: Secondary | ICD-10-CM | POA: Diagnosis not present

## 2018-07-09 DIAGNOSIS — E1129 Type 2 diabetes mellitus with other diabetic kidney complication: Secondary | ICD-10-CM | POA: Diagnosis not present

## 2018-07-09 DIAGNOSIS — N186 End stage renal disease: Secondary | ICD-10-CM | POA: Diagnosis not present

## 2018-07-09 DIAGNOSIS — D631 Anemia in chronic kidney disease: Secondary | ICD-10-CM | POA: Diagnosis not present

## 2018-07-09 DIAGNOSIS — D509 Iron deficiency anemia, unspecified: Secondary | ICD-10-CM | POA: Diagnosis not present

## 2018-07-11 DIAGNOSIS — E1129 Type 2 diabetes mellitus with other diabetic kidney complication: Secondary | ICD-10-CM | POA: Diagnosis not present

## 2018-07-11 DIAGNOSIS — D509 Iron deficiency anemia, unspecified: Secondary | ICD-10-CM | POA: Diagnosis not present

## 2018-07-11 DIAGNOSIS — D631 Anemia in chronic kidney disease: Secondary | ICD-10-CM | POA: Diagnosis not present

## 2018-07-11 DIAGNOSIS — N2581 Secondary hyperparathyroidism of renal origin: Secondary | ICD-10-CM | POA: Diagnosis not present

## 2018-07-11 DIAGNOSIS — N186 End stage renal disease: Secondary | ICD-10-CM | POA: Diagnosis not present

## 2018-07-14 ENCOUNTER — Telehealth: Payer: Self-pay | Admitting: Nurse Practitioner

## 2018-07-14 ENCOUNTER — Telehealth: Payer: Self-pay | Admitting: Endocrinology

## 2018-07-14 DIAGNOSIS — D631 Anemia in chronic kidney disease: Secondary | ICD-10-CM | POA: Diagnosis not present

## 2018-07-14 DIAGNOSIS — N2581 Secondary hyperparathyroidism of renal origin: Secondary | ICD-10-CM | POA: Diagnosis not present

## 2018-07-14 DIAGNOSIS — E1129 Type 2 diabetes mellitus with other diabetic kidney complication: Secondary | ICD-10-CM | POA: Diagnosis not present

## 2018-07-14 DIAGNOSIS — N186 End stage renal disease: Secondary | ICD-10-CM | POA: Diagnosis not present

## 2018-07-14 DIAGNOSIS — D509 Iron deficiency anemia, unspecified: Secondary | ICD-10-CM | POA: Diagnosis not present

## 2018-07-14 NOTE — Telephone Encounter (Signed)
When patient was seen alone at his visit on 05/04/2018, his memory score was still within mild cognitive impairment range.  He did not want to go on any additional medication at that time

## 2018-07-14 NOTE — Telephone Encounter (Signed)
Patients wife stated the past few days he has not been walking very well. His mind is not been right also. Wife states he is taking liraglutide (VICTOZA) 18 MG/3ML SOPN and wasn't sure if this could be effect. She is not sure what to do and wants to start here first. Please advise with patients wife. Ph # [phone]

## 2018-07-14 NOTE — Telephone Encounter (Signed)
Spoke to wife.  She has noted progressive memory loss (remembering days, how to make phone calls, writing checks).  No halluciantions/ delusions.  She does not feel that it is acute.  Add another medication?  Memantine? Please advise.

## 2018-07-14 NOTE — Telephone Encounter (Addendum)
Pts wife(Shirley) requesting a call stating the pts memory has gotten worse stating he is forgetting the days of the week and how to make phone calls, ect. Requesting a call to discuss changing pts medication or coming in sooner please advise

## 2018-07-14 NOTE — Telephone Encounter (Signed)
Spoke to wife and she stated pt is amenable to taking another medication.  Her cell [phone].

## 2018-07-15 ENCOUNTER — Telehealth: Payer: Self-pay | Admitting: Internal Medicine

## 2018-07-15 MED ORDER — MEMANTINE HCL 28 X 5 MG & 21 X 10 MG PO TABS: ORAL_TABLET | ORAL | 0 refills | Status: DC

## 2018-07-15 NOTE — Telephone Encounter (Signed)
Spoke with [name], she doesn't feel he has another fracture.  She said that this weakness in the legs just started last week.  She just wanted to know which direction to go in terms of taking him to the doctor to have that weakness in his legs addressed.  Ortho or Neuro?

## 2018-07-15 NOTE — Telephone Encounter (Signed)
Easier to get into see ortho than Neurologist

## 2018-07-15 NOTE — Telephone Encounter (Signed)
[name] called to say that patient is having trouble walking in the past week.  This came on very suddenly and he took a fall yesterday at the dialysis center.  She wants to know if he should be seen by Orthopedic doc or go back to the Neurosurgeon?    Thank you.

## 2018-07-15 NOTE — Telephone Encounter (Signed)
It is not usual for these symptoms to be seen with Victoza, however, I would suggest to start Victoza for a week and see if the symptoms improve.  In the meantime, he probably needs to see PCP to make sure nothing else is going on.

## 2018-07-15 NOTE — Telephone Encounter (Signed)
Does she think he is just weak or has another compression fracture? We could order LS spine film to see if new compression fracture.

## 2018-07-15 NOTE — Telephone Encounter (Signed)
Will prescribe Namenda, the drug has to be titrated.  Once he is at 10 mg twice a day call for a new prescription. This has been sent to pharmacy

## 2018-07-15 NOTE — Telephone Encounter (Signed)
Spoke to wife of pt and relayed that prescription called in for pt (memantine titration pack) use as directed and when gets to 10mg  po bid, (last week, to call us and will call in maintenance dose at that time).  She will let us know how he does.  She was appreciative.

## 2018-07-16 DIAGNOSIS — E1129 Type 2 diabetes mellitus with other diabetic kidney complication: Secondary | ICD-10-CM | POA: Diagnosis not present

## 2018-07-16 DIAGNOSIS — N2581 Secondary hyperparathyroidism of renal origin: Secondary | ICD-10-CM | POA: Diagnosis not present

## 2018-07-16 DIAGNOSIS — D631 Anemia in chronic kidney disease: Secondary | ICD-10-CM | POA: Diagnosis not present

## 2018-07-16 DIAGNOSIS — D509 Iron deficiency anemia, unspecified: Secondary | ICD-10-CM | POA: Diagnosis not present

## 2018-07-16 DIAGNOSIS — N186 End stage renal disease: Secondary | ICD-10-CM | POA: Diagnosis not present

## 2018-07-16 NOTE — Telephone Encounter (Signed)
Pt is aware of the note he will stop the victoza for a week and call us with how he is feeling but will also call and make an appt with Dr. Renold Genta to see what she has to say

## 2018-07-17 NOTE — Telephone Encounter (Signed)
We can go back to Tradjenta 5 mg daily if sugars start to increase, but I would give it a try without this for at least a week.

## 2018-07-17 NOTE — Telephone Encounter (Signed)
Patients wife called stating [name]'s mind is not well and wanted me to relay the message to her. She is concerned with him stopping Victoza and would like to know if there is medication he can take for his blood sugar. Please Advise

## 2018-07-18 ENCOUNTER — Telehealth: Payer: Self-pay | Admitting: Internal Medicine

## 2018-07-18 DIAGNOSIS — D509 Iron deficiency anemia, unspecified: Secondary | ICD-10-CM | POA: Diagnosis not present

## 2018-07-18 DIAGNOSIS — D631 Anemia in chronic kidney disease: Secondary | ICD-10-CM | POA: Diagnosis not present

## 2018-07-18 DIAGNOSIS — E1129 Type 2 diabetes mellitus with other diabetic kidney complication: Secondary | ICD-10-CM | POA: Diagnosis not present

## 2018-07-18 DIAGNOSIS — N2581 Secondary hyperparathyroidism of renal origin: Secondary | ICD-10-CM | POA: Diagnosis not present

## 2018-07-18 DIAGNOSIS — N186 End stage renal disease: Secondary | ICD-10-CM | POA: Diagnosis not present

## 2018-07-18 NOTE — Telephone Encounter (Signed)
Wife called and states that they haven't heard anything from Orthopedics office.  Advised that I did put in a referral to [facility].  However, she can certainly call their office and make an appointment for patient.  She then states that patient has since taken another fall overnight.  Last night or early this morning about 1:00 a.m. He fell flat and she had to call their neighbor to come over and help to get him up.  She told him that he needs to go to the hospital to find out why he cannot ambulate.  Says that his legs are giving way with him and he just cannot ambulate at this point, but patient refuses to go to the hospital to be evaluated.    Advised that the orthopedist is not her priority at this point.  She needs to get him to the hospital to find out why he is losing the use of his legs.  His potassium or sodium could be low, hypothyroid could be abnormal causing him to have problems with his limbs, theirs so many things that could be weighing into the reasons why he is having trouble ambulating.  So, right now, the priority is getting him to the hospital to be evaluated.  The orthopedic is not a concern until after he is evaluated at the hospital to find out what is going on with his legs and why he cannot ambulate.    She will speak with him and try to convince him of this.

## 2018-07-20 ENCOUNTER — Inpatient Hospital Stay (HOSPITAL_COMMUNITY)
Admission: EM | Admit: 2018-07-20 | Discharge: 2018-07-29 | DRG: 025 | Disposition: A | Discharge status: Rehabilitation Facility | Payer: Medicare Other | Attending: Internal Medicine | Admitting: Internal Medicine

## 2018-07-20 ENCOUNTER — Emergency Department (HOSPITAL_COMMUNITY): Payer: Medicare Other

## 2018-07-20 ENCOUNTER — Encounter (HOSPITAL_COMMUNITY): Payer: Self-pay | Admitting: Emergency Medicine

## 2018-07-20 ENCOUNTER — Other Ambulatory Visit: Payer: Self-pay

## 2018-07-20 DIAGNOSIS — E119 Type 2 diabetes mellitus without complications: Secondary | ICD-10-CM

## 2018-07-20 DIAGNOSIS — G3184 Mild cognitive impairment, so stated: Secondary | ICD-10-CM | POA: Diagnosis present

## 2018-07-20 DIAGNOSIS — G4733 Obstructive sleep apnea (adult) (pediatric): Secondary | ICD-10-CM | POA: Diagnosis present

## 2018-07-20 DIAGNOSIS — I6203 Nontraumatic chronic subdural hemorrhage: Secondary | ICD-10-CM

## 2018-07-20 DIAGNOSIS — S2242XA Multiple fractures of ribs, left side, initial encounter for closed fracture: Secondary | ICD-10-CM | POA: Diagnosis not present

## 2018-07-20 DIAGNOSIS — IMO0001 Reserved for inherently not codable concepts without codable children: Secondary | ICD-10-CM

## 2018-07-20 DIAGNOSIS — N2581 Secondary hyperparathyroidism of renal origin: Secondary | ICD-10-CM | POA: Diagnosis present

## 2018-07-20 DIAGNOSIS — Z880 Allergy status to penicillin: Secondary | ICD-10-CM

## 2018-07-20 DIAGNOSIS — E871 Hypo-osmolality and hyponatremia: Secondary | ICD-10-CM | POA: Diagnosis not present

## 2018-07-20 DIAGNOSIS — Z79899 Other long term (current) drug therapy: Secondary | ICD-10-CM

## 2018-07-20 DIAGNOSIS — I120 Hypertensive chronic kidney disease with stage 5 chronic kidney disease or end stage renal disease: Secondary | ICD-10-CM | POA: Diagnosis present

## 2018-07-20 DIAGNOSIS — M7918 Myalgia, other site: Secondary | ICD-10-CM | POA: Diagnosis not present

## 2018-07-20 DIAGNOSIS — G8929 Other chronic pain: Secondary | ICD-10-CM | POA: Diagnosis present

## 2018-07-20 DIAGNOSIS — E1122 Type 2 diabetes mellitus with diabetic chronic kidney disease: Secondary | ICD-10-CM | POA: Diagnosis present

## 2018-07-20 DIAGNOSIS — N186 End stage renal disease: Secondary | ICD-10-CM | POA: Diagnosis not present

## 2018-07-20 DIAGNOSIS — M79652 Pain in left thigh: Secondary | ICD-10-CM | POA: Diagnosis present

## 2018-07-20 DIAGNOSIS — W19XXXA Unspecified fall, initial encounter: Secondary | ICD-10-CM | POA: Diagnosis present

## 2018-07-20 DIAGNOSIS — R404 Transient alteration of awareness: Secondary | ICD-10-CM | POA: Diagnosis not present

## 2018-07-20 DIAGNOSIS — M545 Low back pain: Secondary | ICD-10-CM | POA: Diagnosis present

## 2018-07-20 DIAGNOSIS — Z9841 Cataract extraction status, right eye: Secondary | ICD-10-CM

## 2018-07-20 DIAGNOSIS — Z794 Long term (current) use of insulin: Secondary | ICD-10-CM

## 2018-07-20 DIAGNOSIS — M1990 Unspecified osteoarthritis, unspecified site: Secondary | ICD-10-CM | POA: Diagnosis present

## 2018-07-20 DIAGNOSIS — R509 Fever, unspecified: Secondary | ICD-10-CM | POA: Diagnosis not present

## 2018-07-20 DIAGNOSIS — R4182 Altered mental status, unspecified: Secondary | ICD-10-CM | POA: Diagnosis not present

## 2018-07-20 DIAGNOSIS — Z87891 Personal history of nicotine dependence: Secondary | ICD-10-CM

## 2018-07-20 DIAGNOSIS — K589 Irritable bowel syndrome without diarrhea: Secondary | ICD-10-CM | POA: Diagnosis present

## 2018-07-20 DIAGNOSIS — E039 Hypothyroidism, unspecified: Secondary | ICD-10-CM | POA: Diagnosis present

## 2018-07-20 DIAGNOSIS — R296 Repeated falls: Secondary | ICD-10-CM | POA: Diagnosis present

## 2018-07-20 DIAGNOSIS — Z823 Family history of stroke: Secondary | ICD-10-CM

## 2018-07-20 DIAGNOSIS — Z992 Dependence on renal dialysis: Secondary | ICD-10-CM

## 2018-07-20 DIAGNOSIS — R410 Disorientation, unspecified: Secondary | ICD-10-CM | POA: Diagnosis not present

## 2018-07-20 DIAGNOSIS — Z833 Family history of diabetes mellitus: Secondary | ICD-10-CM

## 2018-07-20 DIAGNOSIS — I10 Essential (primary) hypertension: Secondary | ICD-10-CM | POA: Diagnosis not present

## 2018-07-20 DIAGNOSIS — R32 Unspecified urinary incontinence: Secondary | ICD-10-CM | POA: Diagnosis present

## 2018-07-20 DIAGNOSIS — S065X9A Traumatic subdural hemorrhage with loss of consciousness of unspecified duration, initial encounter: Principal | ICD-10-CM | POA: Diagnosis present

## 2018-07-20 DIAGNOSIS — R0781 Pleurodynia: Secondary | ICD-10-CM | POA: Diagnosis not present

## 2018-07-20 DIAGNOSIS — I6201 Nontraumatic acute subdural hemorrhage: Secondary | ICD-10-CM | POA: Diagnosis present

## 2018-07-20 DIAGNOSIS — I493 Ventricular premature depolarization: Secondary | ICD-10-CM | POA: Diagnosis present

## 2018-07-20 DIAGNOSIS — I2510 Atherosclerotic heart disease of native coronary artery without angina pectoris: Secondary | ICD-10-CM | POA: Diagnosis present

## 2018-07-20 DIAGNOSIS — S299XXA Unspecified injury of thorax, initial encounter: Secondary | ICD-10-CM | POA: Diagnosis not present

## 2018-07-20 DIAGNOSIS — F0390 Unspecified dementia without behavioral disturbance: Secondary | ICD-10-CM | POA: Diagnosis present

## 2018-07-20 DIAGNOSIS — Z8249 Family history of ischemic heart disease and other diseases of the circulatory system: Secondary | ICD-10-CM

## 2018-07-20 DIAGNOSIS — E8889 Other specified metabolic disorders: Secondary | ICD-10-CM | POA: Diagnosis present

## 2018-07-20 DIAGNOSIS — E785 Hyperlipidemia, unspecified: Secondary | ICD-10-CM | POA: Diagnosis present

## 2018-07-20 DIAGNOSIS — F05 Delirium due to known physiological condition: Secondary | ICD-10-CM | POA: Diagnosis present

## 2018-07-20 DIAGNOSIS — M79651 Pain in right thigh: Secondary | ICD-10-CM | POA: Diagnosis present

## 2018-07-20 DIAGNOSIS — S065XAA Traumatic subdural hemorrhage with loss of consciousness status unknown, initial encounter: Secondary | ICD-10-CM

## 2018-07-20 DIAGNOSIS — R269 Unspecified abnormalities of gait and mobility: Secondary | ICD-10-CM | POA: Diagnosis present

## 2018-07-20 DIAGNOSIS — Z7982 Long term (current) use of aspirin: Secondary | ICD-10-CM

## 2018-07-20 DIAGNOSIS — S065X0A Traumatic subdural hemorrhage without loss of consciousness, initial encounter: Secondary | ICD-10-CM | POA: Diagnosis not present

## 2018-07-20 DIAGNOSIS — I9589 Other hypotension: Secondary | ICD-10-CM | POA: Diagnosis present

## 2018-07-20 DIAGNOSIS — D631 Anemia in chronic kidney disease: Secondary | ICD-10-CM | POA: Diagnosis present

## 2018-07-20 DIAGNOSIS — K219 Gastro-esophageal reflux disease without esophagitis: Secondary | ICD-10-CM | POA: Diagnosis present

## 2018-07-20 DIAGNOSIS — Z9842 Cataract extraction status, left eye: Secondary | ICD-10-CM

## 2018-07-20 DIAGNOSIS — E11649 Type 2 diabetes mellitus with hypoglycemia without coma: Secondary | ICD-10-CM | POA: Diagnosis present

## 2018-07-20 NOTE — Telephone Encounter (Signed)
Agree ED evaluation is warranted

## 2018-07-20 NOTE — ED Notes (Signed)
Pt gone to Xray 

## 2018-07-20 NOTE — ED Triage Notes (Signed)
Pt to ED via [facility].  [name] reported to EMS that pt has dementia and it's usually worse at night but st's pt has been more confused than normal all day.  Pt warm to touch.

## 2018-07-20 NOTE — ED Notes (Addendum)
Pt alert and oriented x's 3  Pt denies any pain or discomfort at this time

## 2018-07-20 NOTE — ED Provider Notes (Signed)
[facility] EMERGENCY DEPARTMENT Provider Note   CSN: [id number] Arrival date & time: 07/20/18  2220     History   Chief Complaint Chief Complaint  Patient presents with  . Altered Mental Status    HPI THELONIOUS KAUFFMANN is a 77 y.o. male.  The history is provided by the patient and medical records.     77 year old male with history of seasonal allergies, anemia, coronary artery disease, diabetes, end-stage renal disease on hemodialysis, GERD, hyperlipidemia, hypertension, hypothyroidism, HTN, sleep apnea, presenting to the ED with AMS.  Per EMS, patient has history of dementia that is usually worse at night but today has been much more confused than normal.  Patient reports to me that he feels fine.  He tells me he does have a little pain in his left rib from a fall 2 days ago.  Pain is worse with movement and deep breathing.  He denies head injury during that fall.  He denies any shortness of breath.  He states he has not felt ill.  He denies any cough.  No issues with dialysis.  Takes full dose ASA, no other anticoagulation.  On chart review, it seems wife called and spoke with physician as patient has had several falls recently, unsure why he keeps falling.  Wife is unable to get him out of the floor by herself, has been relying on neighbors for help.  Wife not here during initial assessment, will speak with her about this once she arrives.  Past Medical History:  Diagnosis Date  . Allergy   . Anemia   . Arthritis   . Cataract    bil cateracts removed  . Coronary artery disease   . Diabetes mellitus    Type 2  . Diverticulitis   . ED (erectile dysfunction)   . 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 ESRD (end stage renal disease) on dialysis ([place]) 03/2015  . GERD (gastroesophageal reflux disease)    pepto   . Gout   . Hyperlipidemia   . Hypertension   . Hypothyroidism   . Pneumonia   . PVD (peripheral vascular disease) ([place])    has plastic aorta  . Renal  insufficiency   . Seasonal allergies   . Shortness of breath dyspnea   . Sleep apnea    does not wear c-pap  . Thyroid disease     Patient Active Problem List   Diagnosis Date Noted  . Angiodysplasia of stomach   . Occult GI bleeding   . GIB (gastrointestinal bleeding) 06/01/2018  . Low back pain 01/17/2018  . Anorexia   . Dyspnea   . Macrocytic anemia 06/04/2016  . Thrombocytopenia ([place]) 06/04/2016  . Exertional dyspnea 06/04/2016  . Arm paresthesia, left 06/04/2016  . Dyspnea on exertion 06/04/2016  . Tenderness of right calf 06/04/2016  . Mild cognitive impairment with memory loss 01/12/2016  . ESRD on dialysis ([place]) 05/17/2015  . Hypoglycemia   . Weakness 02/18/2015  . OSA (obstructive sleep apnea) 02/02/2015  . Hyperkalemia 10/05/2014  . Chronic kidney disease (CKD), stage IV (severe) ([place]) 04/07/2014  . End stage renal disease ([place]) 11/18/2013  . BPH (benign prostatic hyperplasia) 11/22/2012  . Peripheral vascular disease ([place]) 12/24/2011  . Hyperparathyroidism ([place]) 12/24/2011  . Hypothyroidism 12/24/2011  . Allergic rhinitis 12/24/2011  . Erectile dysfunction 12/24/2011  . Insulin dependent diabetes mellitus ([place]) 09/03/2008  . Hyperlipidemia 09/03/2008  . Essential hypertension 08/30/2008  . PANCREATITIS, HX OF 08/30/2008  . RENAL FAILURE, ACUTE, HX  OF 08/30/2008  . DIVERTICULOSIS, COLON 06/08/2003    Past Surgical History:  Procedure Laterality Date  . aortobifemoral bypass    . AV FISTULA PLACEMENT Left 12/01/2013   Procedure: ARTERIOVENOUS (AV) FISTULA CREATION- LEFT BRACHIOCEPHALIC;  Surgeon: Angelia Mould, MD;  Location: [facility];  Service: Vascular;  Laterality: Left;  . [place] TRANSPOSITION Right 07/27/2014   Procedure: BASCILIC VEIN TRANSPOSITION;  Surgeon: Angelia Mould, MD;  Location: [facility];  Service: Vascular;  Laterality: Right;  . BREAST SURGERY     left - granulomatous mastitis  . COLONOSCOPY    . 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 ENTEROSCOPY N/A 06/03/2018   Procedure: ENTEROSCOPY;  Surgeon: Lavena Bullion, DO;  Location: [facility];  Service: Gastroenterology;  Laterality: N/A;  . EYE SURGERY Bilateral    cataracts  . HEMODIALYSIS INPATIENT  01/17/2018      . HOT HEMOSTASIS N/A 06/03/2018   Procedure: HOT HEMOSTASIS (ARGON PLASMA COAGULATION/BICAP);  Surgeon: Lavena Bullion, DO;  Location: [facility] ENDOSCOPY;  Service: Gastroenterology;  Laterality: N/A;  . REVISON OF ARTERIOVENOUS FISTULA Left 02/09/2014   Procedure: REVISON OF LEFT ARTERIOVENOUS FISTULA - RESECTION OF RENDUNDANT VEIN;  Surgeon: Angelia Mould, MD;  Location: [facility];  Service: Vascular;  Laterality: Left;  . SBO with lysis adhesions    . SHUNTOGRAM Left 04/19/2014   Procedure: FISTULOGRAM;  Surgeon: Angelia Mould, MD;  Location: [facility] CATH LAB;  Service: Cardiovascular;  Laterality: Left;  . UNILATERAL UPPER EXTREMEITY ANGIOGRAM N/A 07/12/2014   Procedure: UNILATERAL UPPER Anselmo Rod;  Surgeon: Angelia Mould, MD;  Location: [facility] CATH LAB;  Service: Cardiovascular;  Laterality: N/A;        Home Medications    Prior to Admission medications   Medication Sig Start Date End Date Taking? Authorizing Provider  acetaminophen (TYLENOL) 500 MG tablet Take 1,000 mg by mouth 2 (two) times daily as needed (for pain).     [provider]  allopurinol (ZYLOPRIM) 100 MG tablet Take 100 mg by mouth daily as needed (pain).     [provider]  aspirin 325 MG tablet Take 325 mg by mouth daily.     [provider]  Blood Glucose Monitoring Suppl (FREESTYLE FREEDOM LITE) w/Device KIT Use to check blood sugar 2 times per day dx code E11.65 03/08/16   Elayne Snare, MD  Carboxymethylcellulose Sod PF 0.25 % SOLN Place 1 drop into both eyes as needed (dry eyes).    [provider]  clobetasol (TEMOVATE) 0.05 % external solution Apply 1 application topically 2 (two) times daily as  needed (irritation). Apply to scalp 04/19/15   [provider]  denosumab (PROLIA) 60 MG/ML SOSY injection Inject 60 mg into the skin every 6 (six) months.    [provider]  donepezil (ARICEPT) 10 MG tablet Take 1 tablet (10 mg total) by mouth at bedtime. 05/06/18   Dennie Bible, NP  FLUOCINOLONE ACETONIDE SCALP 0.01 % OIL Apply 1 application topically at bedtime. Apply to scalp 04/28/15   [provider]  fluticasone (CUTIVATE) 0.05 % cream Apply 1 application topically 2 (two) times daily as needed (irritation). Apply to face 04/19/15   [provider]  fluticasone (FLONASE) 50 MCG/ACT nasal spray Place 1 spray into both nostrils daily. Patient taking differently: Place 1 spray into both nostrils daily as needed for allergies.  10/08/14   Mikhail, Velta Addison, DO  glucose blood (FREESTYLE LITE) test strip  Use as instructed to check blood sugar 3 times daily. 04/22/18   Elayne Snare, MD  glucose blood (ONETOUCH VERIO) test strip Use to check blood sugar 2 times per day. Dx code E11.65 03/01/16   Elayne Snare, MD  Insulin Pen Needle 31G X 5 MM MISC Use with Victoza pen once a day 04/22/18   Elayne Snare, MD  ketoconazole (NIZORAL) 2 % shampoo Apply 1 application topically 2 (two) times a week. Patient taking differently: Apply 1 application topically at bedtime.  03/31/15   Elby Showers, MD  Lancets (FREESTYLE) lancets Use as instructed to check blood sugar 2 times per day dx code E11.65 02/22/16   Elayne Snare, MD  levothyroxine (SYNTHROID, LEVOTHROID) 50 MCG tablet Take 1 tablet (50 mcg total) by mouth daily. 08/06/14   Elby Showers, MD  liraglutide (VICTOZA) 18 MG/3ML SOPN Inject 0.1 mLs (0.6 mg total) into the skin daily. Patient taking differently: Inject 0.6 mg into the skin at bedtime.  05/27/18   Elayne Snare, MD  loratadine (CLARITIN) 10 MG tablet Take 10 mg by mouth daily as needed for allergies.     [provider]  memantine [facility] TITRATION PACK)  tablet pack 5 mg/day for =1 week; 5 mg twice daily for =1 week; 15 mg/day given in 5 mg and 10 mg separated doses for =1 week; then 10 mg twice daily 07/15/18   Dennie Bible, NP  midodrine (PROAMATINE) 10 MG tablet Take 10 mg by mouth 3 (three) times daily.    [provider]  Naftifine HCl (NAFTIN) 2 % CREA Apply to feet daily for fungal infection Patient taking differently: Apply 1 application topically daily as needed (for fungal infection). Apply to feet 09/12/15   Baxley, Cresenciano Lick, MD  nystatin-triamcinolone (MYCOLOG II) cream Apply 1 application topically 2 (two) times daily. 09/26/17   [place]by Showers, MD  omeprazole (PRILOSEC) 40 MG capsule Take 1 capsule (40 mg total) by mouth daily. 03/13/18   Esterwood, Amy S, PA-C  polyethylene glycol (MIRALAX / GLYCOLAX) packet Take 17 g by mouth daily.    [provider]  RENVELA 800 MG tablet Take 800 mg by mouth daily with supper.  07/27/14   [provider]  simvastatin (ZOCOR) 20 MG tablet Take 20 mg by mouth at bedtime.     [provider]  vitamin B-12 (CYANOCOBALAMIN) 1000 MCG tablet Take 1 tablet (1,000 mcg total) by mouth daily. 06/03/18   Patrecia Pour, MD    Family History Family History  Problem Relation Age of Onset  . Aneurysm Mother   . Heart disease Father   . Stroke Father   . Hypertension Father   . Diabetes Father   . Dementia Neg Hx   . Colon cancer Neg Hx   . Esophageal cancer Neg Hx   . Pancreatic cancer Neg Hx   . Prostate cancer Neg Hx   . Rectal cancer Neg Hx   . Stomach cancer Neg Hx     Social History Social History   Tobacco Use  . Smoking status: Former Smoker    Last attempt to quit: 09/24/1994    Years since quitting: 23.8  . Smokeless tobacco: Never Used  Substance Use Topics  . Alcohol use: No    Alcohol/week: 0.0 standard drinks    Comment: Quit Oct. 1977 ("somewhat heavy")  . Drug use: No     Allergies   Penicillins   Review of Systems Review of  Systems  Unable to perform ROS: Dementia     Physical Exam Updated Vital Signs BP 137/79 (BP Location: Left Wrist)   Pulse 77   Resp 18   Ht _0  (1.803 m)   Wt 68.9 kg   SpO2 99%   BMI 21.20 kg/m   Physical Exam  Constitutional: He is oriented to person, place, and time. He appears well-developed and well-nourished.  Awake, watching football in room, NAD  HENT:  Head: Normocephalic and atraumatic.  Mouth/Throat: Oropharynx is clear and moist.  Eyes: Pupils are equal, round, and reactive to light. Conjunctivae and EOM are normal.  Neck: Normal range of motion.  Cardiovascular: Normal rate, regular rhythm and normal heart sounds.  Pulmonary/Chest: Effort normal and breath sounds normal.  Tenderness of left ribs just lateral to the nipple; no apparent bruising or deformity of this area; lungs clear bilaterally  Abdominal: Soft. Bowel sounds are normal.  Musculoskeletal: Normal range of motion.  HD fistula RUE with bandage in place, thrill noted, no signs of infection  Neurological: He is alert and oriented to person, place, and time.  Awake, alert, answering questions and following commands when prompted, able to tell me name, birthday, wife's name, identify surroundings, some confusion about year, moving arms and legs without issue  Skin: Skin is warm and dry.  Psychiatric: He has a normal mood and affect.  Nursing note and vitals reviewed.    ED Treatments / Results  Labs (all labs ordered are listed, but only abnormal results are displayed) Labs Reviewed  CBC WITH DIFFERENTIAL/PLATELET - Abnormal; Notable for the following components:      Result Value   RBC 3.27 (*)    Hemoglobin 10.6 (*)    HCT 32.7 (*)    RDW 16.1 (*)    All other components within normal limits  COMPREHENSIVE METABOLIC PANEL - Abnormal; Notable for the following components:   Chloride 96 (*)    Glucose, Bld 107 (*)    BUN 31 (*)    Creatinine, Ser 7.36 (*)    Calcium 11.2 (*)    Total  Protein 5.1 (*)    Albumin 2.5 (*)    GFR calc non Af Amer 6 (*)    GFR calc Af Amer 7 (*)    All other components within normal limits  I-STAT CHEM 8, ED - Abnormal; Notable for the following components:   Sodium 134 (*)    Chloride 95 (*)    BUN 37 (*)    Creatinine, Ser 8.10 (*)    Glucose, Bld 101 (*)    TCO2 35 (*)    Hemoglobin 11.9 (*)    HCT 35.0 (*)    All other components within normal limits  URINALYSIS, ROUTINE W REFLEX MICROSCOPIC  I-STAT TROPONIN, ED  TYPE AND SCREEN    EKG EKG Interpretation  Date/Time:  Monday July 21 2018 00:01:45 EDT Ventricular Rate:  89 PR Interval:    QRS Duration: 88 QT Interval:  398 QTC Calculation: 465 R Axis:   -39 Text Interpretation:  Sinus rhythm Multiple ventricular premature complexes Left axis deviation Low voltage, precordial leads Probable anteroseptal infarct, old Interpretation limited secondary to artifact Confirmed by Ripley Fraise ([id number]) on 07/21/2018 12:37:18 AM   Radiology Dg Ribs Unilateral W/chest Left  Result Date: 07/20/2018 CLINICAL DATA:  Fall 2 days ago with left rib pain. EXAM: LEFT RIBS AND CHEST - 3+ VIEW COMPARISON:  None. FINDINGS: Lungs are hypoinflated without consolidation, effusion or pneumothorax. Cardiomediastinal silhouette is within  normal. Findings suggest subtle fractures of the lateral left tenth rib and anterolateral left ninth rib. Possible lateral left seventh rib fracture. IMPRESSION: No acute cardiopulmonary disease. Possible subtle fractures of the left seventh, ninth and tenth ribs as described. Electronically Signed   By: Marin Olp M.D.   On: 07/20/2018 23:37   Ct Head Wo Contrast  Result Date: 07/20/2018 CLINICAL DATA:  77 y/o M; altered mental status. Unclear cause. Several falls today. EXAM: CT HEAD WITHOUT CONTRAST TECHNIQUE: Contiguous axial images were obtained from the base of the skull through the vertex without intravenous contrast. COMPARISON:  04/12/2015 MRI head.  FINDINGS: Brain: Bilateral subdural hematomas on the right measuring up to 18 mm in thickness on the left up to 15 mm in thickness (series 5, image 37). The subdural hematomas are largely hypointense, however, there is gradient increased attenuation of the right-sided collection and and high attenuation retracted clot in the left-sided collection indicating recent hemorrhage. Mass effect on the brain results in diffuse sulcal effacement. There is no midline shift or downward herniation this time. No findings of stroke, brain parenchymal hemorrhage, or focal mass effect of the brain. Moderate volume loss of the brain is stable from prior MRI of the head given differences in technique. There is a punctate calcification in the right frontal lobe compatible sequelae of prior infectious or inflammatory process. Vascular: Calcific atherosclerosis of the carotid siphons and vertebral arteries. No hyperdense vessel identified. Skull: Normal. Negative for fracture or focal lesion. Sinuses/Orbits: No acute finding. Other: Bilateral intra-ocular lens replacement. IMPRESSION: Large mixed attenuation bilateral subdural hematomas measuring up to 18 mm in thickness on the right and 15 mm on the left, likely acute on chronic. Mass effect on the brain with concave deformation and sulcal effacement. No midline shift or herniation. Critical Value/emergent results were called by telephone at the time of interpretation on 07/20/2018 at 11:55 pm to Dr. Quincy Carnes , who verbally acknowledged these results. Electronically Signed   By: [name] M.D.   On: 07/20/2018 23:57    Procedures Procedures (including critical care time)  CRITICAL CARE Performed by: Larene Pickett   Total critical care time: 45 minutes  Critical care time was exclusive of separately billable procedures and treating other patients.  Critical care was necessary to treat or prevent imminent or life-threatening deterioration.  Critical  care was time spent personally by me on the following activities: development of treatment plan with patient and/or surrogate as well as nursing, discussions with consultants, evaluation of patient's response to treatment, examination of patient, obtaining history from patient or surrogate, ordering and performing treatments and interventions, ordering and review of laboratory studies, ordering and review of radiographic studies, pulse oximetry and re-evaluation of patient's condition.   Medications Ordered in ED Medications - No data to display   Initial Impression / Assessment and Plan / ED Course  I have reviewed the triage vital signs and the nursing notes.  Pertinent labs & imaging results that were available during my care of the patient were reviewed by me and considered in my medical decision making (see chart for details).  77 y.o. M here with AMS.  Has history of dementia but apparently has been more confused today than normal.  Has also had frequent falls.  Patient's only complaint is some left-sided rib pain from fall 2 days ago.  He denies head trauma.  He is awake, alert, mostly oriented.  He is able to tell me his full name, wife's name,  birthday, surroundings, some confusion about the year.  Does have some tenderness of the left ribs but no acute deformity.  Lungs are clear.  He is moving his arms and legs well without any apparent ataxia.  Dialysis fistula of right upper arm appears clean and dry without any bleeding or signs of infection.  Will obtain head CT, rib films, labs.  CT of the head with bilateral subdural hematomas, appear to be acute on chronic.  Right 18 mm, left 15 millimeters.  There is some mass-effect but no herniation or midline shift.  Rib films with possible fractures of left 7th, 9th, 10th ribs.  No pneumothorax.  Will discuss with neurosurgery.    Discussed with neurosurgery, Dr. Arnoldo Morale-- no acute intervention.  As patient will likely need admission, he will  see patient in the morning for formal consultation. Discussed with trauma service-- they did not feel like they needed to be involved given ground level fall.  Patient's labs are overall reassuring given his dialysis status.  His potassium is normal.  Given his injuries and frequent falls with what sounds like a unsafe environment at home, I feel he will need admission for PT/OT eval, possibly home health as well. He will need dialysis arranged for in the morning as well.  Discussed with Dr. Myna Hidalgo-- he will admit for ongoing care.  Nephrology will be consulted in the morning.  Final Clinical Impressions(s) / ED Diagnoses   Final diagnoses:  Bilateral subdural hematomas ([place])  Closed fracture of multiple ribs of left side, initial encounter    ED Discharge Orders    None       Larene Pickett, PA-C 07/21/18 [id number]    Ripley Fraise, MD 07/21/18 [phone]

## 2018-07-21 ENCOUNTER — Telehealth: Payer: Self-pay | Admitting: Internal Medicine

## 2018-07-21 ENCOUNTER — Encounter (HOSPITAL_COMMUNITY): Payer: Self-pay | Admitting: Family Medicine

## 2018-07-21 DIAGNOSIS — Z794 Long term (current) use of insulin: Secondary | ICD-10-CM | POA: Diagnosis not present

## 2018-07-21 DIAGNOSIS — I493 Ventricular premature depolarization: Secondary | ICD-10-CM | POA: Diagnosis present

## 2018-07-21 DIAGNOSIS — N186 End stage renal disease: Secondary | ICD-10-CM | POA: Diagnosis not present

## 2018-07-21 DIAGNOSIS — E1122 Type 2 diabetes mellitus with diabetic chronic kidney disease: Secondary | ICD-10-CM | POA: Diagnosis not present

## 2018-07-21 DIAGNOSIS — E1151 Type 2 diabetes mellitus with diabetic peripheral angiopathy without gangrene: Secondary | ICD-10-CM | POA: Diagnosis present

## 2018-07-21 DIAGNOSIS — E119 Type 2 diabetes mellitus without complications: Secondary | ICD-10-CM | POA: Diagnosis not present

## 2018-07-21 DIAGNOSIS — S065X0A Traumatic subdural hemorrhage without loss of consciousness, initial encounter: Secondary | ICD-10-CM | POA: Diagnosis not present

## 2018-07-21 DIAGNOSIS — E871 Hypo-osmolality and hyponatremia: Secondary | ICD-10-CM | POA: Diagnosis not present

## 2018-07-21 DIAGNOSIS — G3184 Mild cognitive impairment, so stated: Secondary | ICD-10-CM

## 2018-07-21 DIAGNOSIS — I6201 Nontraumatic acute subdural hemorrhage: Secondary | ICD-10-CM | POA: Diagnosis present

## 2018-07-21 DIAGNOSIS — I6202 Nontraumatic subacute subdural hemorrhage: Secondary | ICD-10-CM | POA: Diagnosis not present

## 2018-07-21 DIAGNOSIS — T8249XA Other complication of vascular dialysis catheter, initial encounter: Secondary | ICD-10-CM | POA: Diagnosis not present

## 2018-07-21 DIAGNOSIS — S065X0S Traumatic subdural hemorrhage without loss of consciousness, sequela: Secondary | ICD-10-CM | POA: Diagnosis not present

## 2018-07-21 DIAGNOSIS — E11649 Type 2 diabetes mellitus with hypoglycemia without coma: Secondary | ICD-10-CM | POA: Diagnosis present

## 2018-07-21 DIAGNOSIS — D72829 Elevated white blood cell count, unspecified: Secondary | ICD-10-CM | POA: Diagnosis not present

## 2018-07-21 DIAGNOSIS — E785 Hyperlipidemia, unspecified: Secondary | ICD-10-CM | POA: Diagnosis present

## 2018-07-21 DIAGNOSIS — F05 Delirium due to known physiological condition: Secondary | ICD-10-CM | POA: Diagnosis not present

## 2018-07-21 DIAGNOSIS — R195 Other fecal abnormalities: Secondary | ICD-10-CM | POA: Diagnosis not present

## 2018-07-21 DIAGNOSIS — S069X0A Unspecified intracranial injury without loss of consciousness, initial encounter: Secondary | ICD-10-CM | POA: Diagnosis not present

## 2018-07-21 DIAGNOSIS — W19XXXA Unspecified fall, initial encounter: Secondary | ICD-10-CM | POA: Diagnosis present

## 2018-07-21 DIAGNOSIS — R269 Unspecified abnormalities of gait and mobility: Secondary | ICD-10-CM | POA: Diagnosis present

## 2018-07-21 DIAGNOSIS — R0989 Other specified symptoms and signs involving the circulatory and respiratory systems: Secondary | ICD-10-CM | POA: Diagnosis not present

## 2018-07-21 DIAGNOSIS — Z9842 Cataract extraction status, left eye: Secondary | ICD-10-CM | POA: Diagnosis not present

## 2018-07-21 DIAGNOSIS — R296 Repeated falls: Secondary | ICD-10-CM | POA: Diagnosis not present

## 2018-07-21 DIAGNOSIS — R7309 Other abnormal glucose: Secondary | ICD-10-CM | POA: Diagnosis not present

## 2018-07-21 DIAGNOSIS — E1129 Type 2 diabetes mellitus with other diabetic kidney complication: Secondary | ICD-10-CM | POA: Diagnosis not present

## 2018-07-21 DIAGNOSIS — D631 Anemia in chronic kidney disease: Secondary | ICD-10-CM | POA: Diagnosis not present

## 2018-07-21 DIAGNOSIS — S2242XD Multiple fractures of ribs, left side, subsequent encounter for fracture with routine healing: Secondary | ICD-10-CM | POA: Diagnosis not present

## 2018-07-21 DIAGNOSIS — M79651 Pain in right thigh: Secondary | ICD-10-CM | POA: Diagnosis present

## 2018-07-21 DIAGNOSIS — X58XXXS Exposure to other specified factors, sequela: Secondary | ICD-10-CM | POA: Diagnosis present

## 2018-07-21 DIAGNOSIS — S065X9A Traumatic subdural hemorrhage with loss of consciousness of unspecified duration, initial encounter: Secondary | ICD-10-CM | POA: Diagnosis present

## 2018-07-21 DIAGNOSIS — M79652 Pain in left thigh: Secondary | ICD-10-CM | POA: Diagnosis present

## 2018-07-21 DIAGNOSIS — S2242XA Multiple fractures of ribs, left side, initial encounter for closed fracture: Secondary | ICD-10-CM | POA: Diagnosis present

## 2018-07-21 DIAGNOSIS — D638 Anemia in other chronic diseases classified elsewhere: Secondary | ICD-10-CM | POA: Diagnosis not present

## 2018-07-21 DIAGNOSIS — I10 Essential (primary) hypertension: Secondary | ICD-10-CM | POA: Diagnosis not present

## 2018-07-21 DIAGNOSIS — G8929 Other chronic pain: Secondary | ICD-10-CM | POA: Diagnosis not present

## 2018-07-21 DIAGNOSIS — I120 Hypertensive chronic kidney disease with stage 5 chronic kidney disease or end stage renal disease: Secondary | ICD-10-CM | POA: Diagnosis not present

## 2018-07-21 DIAGNOSIS — X58XXXD Exposure to other specified factors, subsequent encounter: Secondary | ICD-10-CM | POA: Diagnosis present

## 2018-07-21 DIAGNOSIS — E039 Hypothyroidism, unspecified: Secondary | ICD-10-CM | POA: Diagnosis not present

## 2018-07-21 DIAGNOSIS — F0390 Unspecified dementia without behavioral disturbance: Secondary | ICD-10-CM | POA: Diagnosis not present

## 2018-07-21 DIAGNOSIS — S069X0S Unspecified intracranial injury without loss of consciousness, sequela: Secondary | ICD-10-CM | POA: Diagnosis not present

## 2018-07-21 DIAGNOSIS — I6203 Nontraumatic chronic subdural hemorrhage: Secondary | ICD-10-CM | POA: Diagnosis not present

## 2018-07-21 DIAGNOSIS — S069X0D Unspecified intracranial injury without loss of consciousness, subsequent encounter: Secondary | ICD-10-CM | POA: Diagnosis not present

## 2018-07-21 DIAGNOSIS — S065XAA Traumatic subdural hemorrhage with loss of consciousness status unknown, initial encounter: Secondary | ICD-10-CM | POA: Diagnosis present

## 2018-07-21 DIAGNOSIS — S065X9S Traumatic subdural hemorrhage with loss of consciousness of unspecified duration, sequela: Secondary | ICD-10-CM | POA: Diagnosis present

## 2018-07-21 DIAGNOSIS — K219 Gastro-esophageal reflux disease without esophagitis: Secondary | ICD-10-CM | POA: Diagnosis present

## 2018-07-21 DIAGNOSIS — G4733 Obstructive sleep apnea (adult) (pediatric): Secondary | ICD-10-CM | POA: Diagnosis present

## 2018-07-21 DIAGNOSIS — Z298 Encounter for other specified prophylactic measures: Secondary | ICD-10-CM | POA: Diagnosis not present

## 2018-07-21 DIAGNOSIS — Z9841 Cataract extraction status, right eye: Secondary | ICD-10-CM | POA: Diagnosis not present

## 2018-07-21 DIAGNOSIS — Z992 Dependence on renal dialysis: Secondary | ICD-10-CM | POA: Diagnosis not present

## 2018-07-21 DIAGNOSIS — M545 Low back pain: Secondary | ICD-10-CM | POA: Diagnosis not present

## 2018-07-21 DIAGNOSIS — N2581 Secondary hyperparathyroidism of renal origin: Secondary | ICD-10-CM | POA: Diagnosis not present

## 2018-07-21 DIAGNOSIS — M1990 Unspecified osteoarthritis, unspecified site: Secondary | ICD-10-CM | POA: Diagnosis present

## 2018-07-21 DIAGNOSIS — I2510 Atherosclerotic heart disease of native coronary artery without angina pectoris: Secondary | ICD-10-CM | POA: Diagnosis present

## 2018-07-21 DIAGNOSIS — I6200 Nontraumatic subdural hemorrhage, unspecified: Secondary | ICD-10-CM | POA: Diagnosis not present

## 2018-07-21 LAB — CBC WITH DIFFERENTIAL/PLATELET
Abs Immature Granulocytes: 0.02 10*3/uL (ref 0.00–0.07)
Basophils Absolute: 0 10*3/uL (ref 0.0–0.1)
Basophils Relative: 1 %
Eosinophils Absolute: 0.2 10*3/uL (ref 0.0–0.5)
Eosinophils Relative: 2 %
HCT: 32.7 % — ABNORMAL LOW (ref 39.0–52.0)
Hemoglobin: 10.6 g/dL — ABNORMAL LOW (ref 13.0–17.0)
Immature Granulocytes: 0 %
Lymphocytes Relative: 26 %
Lymphs Abs: 2.2 10*3/uL (ref 0.7–4.0)
MCH: 32.4 pg (ref 26.0–34.0)
MCHC: 32.4 g/dL (ref 30.0–36.0)
MCV: 100 fL (ref 80.0–100.0)
Monocytes Absolute: 0.9 10*3/uL (ref 0.1–1.0)
Monocytes Relative: 10 %
Neutro Abs: 5.3 10*3/uL (ref 1.7–7.7)
Neutrophils Relative %: 61 %
Platelets: 248 10*3/uL (ref 150–400)
RBC: 3.27 MIL/uL — ABNORMAL LOW (ref 4.22–5.81)
RDW: 16.1 % — ABNORMAL HIGH (ref 11.5–15.5)
WBC: 8.6 10*3/uL (ref 4.0–10.5)
nRBC: 0.2 % (ref 0.0–0.2)

## 2018-07-21 LAB — TYPE AND SCREEN
ABO/RH(D): O POS
Antibody Screen: NEGATIVE

## 2018-07-21 LAB — I-STAT CHEM 8, ED
BUN: 37 mg/dL — ABNORMAL HIGH (ref 8–23)
Calcium, Ion: 1.33 mmol/L (ref 1.15–1.40)
Chloride: 95 mmol/L — ABNORMAL LOW (ref 98–111)
Creatinine, Ser: 8.1 mg/dL — ABNORMAL HIGH (ref 0.61–1.24)
Glucose, Bld: 101 mg/dL — ABNORMAL HIGH (ref 70–99)
HCT: 35 % — ABNORMAL LOW (ref 39.0–52.0)
Hemoglobin: 11.9 g/dL — ABNORMAL LOW (ref 13.0–17.0)
Potassium: 3.6 mmol/L (ref 3.5–5.1)
Sodium: 134 mmol/L — ABNORMAL LOW (ref 135–145)
TCO2: 35 mmol/L — ABNORMAL HIGH (ref 22–32)

## 2018-07-21 LAB — COMPREHENSIVE METABOLIC PANEL
ALT: 13 U/L (ref 0–44)
AST: 35 U/L (ref 15–41)
Albumin: 2.5 g/dL — ABNORMAL LOW (ref 3.5–5.0)
Alkaline Phosphatase: 84 U/L (ref 38–126)
Anion gap: 9 (ref 5–15)
BUN: 31 mg/dL — ABNORMAL HIGH (ref 8–23)
CO2: 31 mmol/L (ref 22–32)
Calcium: 11.2 mg/dL — ABNORMAL HIGH (ref 8.9–10.3)
Chloride: 96 mmol/L — ABNORMAL LOW (ref 98–111)
Creatinine, Ser: 7.36 mg/dL — ABNORMAL HIGH (ref 0.61–1.24)
GFR calc Af Amer: 7 mL/min — ABNORMAL LOW (ref >60)
GFR calc non Af Amer: 6 mL/min — ABNORMAL LOW (ref >60)
Glucose, Bld: 107 mg/dL — ABNORMAL HIGH (ref 70–99)
Potassium: 3.7 mmol/L (ref 3.5–5.1)
Sodium: 136 mmol/L (ref 135–145)
Total Bilirubin: 0.8 mg/dL (ref 0.3–1.2)
Total Protein: 5.1 g/dL — ABNORMAL LOW (ref 6.5–8.1)

## 2018-07-21 LAB — URINALYSIS, ROUTINE W REFLEX MICROSCOPIC
Bilirubin Urine: NEGATIVE
Glucose, UA: NEGATIVE mg/dL
Hgb urine dipstick: NEGATIVE
Ketones, ur: NEGATIVE mg/dL
Nitrite: NEGATIVE
Protein, ur: 100 mg/dL — AB
Specific Gravity, Urine: 1.009 (ref 1.005–1.030)
pH: 9 — ABNORMAL HIGH (ref 5.0–8.0)

## 2018-07-21 LAB — RENAL FUNCTION PANEL
Albumin: 2.5 g/dL — ABNORMAL LOW (ref 3.5–5.0)
Anion gap: 15 (ref 5–15)
BUN: 34 mg/dL — ABNORMAL HIGH (ref 8–23)
CO2: 29 mmol/L (ref 22–32)
Calcium: 11.1 mg/dL — ABNORMAL HIGH (ref 8.9–10.3)
Chloride: 94 mmol/L — ABNORMAL LOW (ref 98–111)
Creatinine, Ser: 8.07 mg/dL — ABNORMAL HIGH (ref 0.61–1.24)
GFR calc Af Amer: 7 mL/min — ABNORMAL LOW (ref >60)
GFR calc non Af Amer: 6 mL/min — ABNORMAL LOW (ref >60)
Glucose, Bld: 95 mg/dL (ref 70–99)
Phosphorus: 6.4 mg/dL — ABNORMAL HIGH (ref 2.5–4.6)
Potassium: 3.8 mmol/L (ref 3.5–5.1)
Sodium: 138 mmol/L (ref 135–145)

## 2018-07-21 LAB — GLUCOSE, CAPILLARY: Glucose-Capillary: 93 mg/dL (ref 70–99)

## 2018-07-21 LAB — CBC
HCT: 34.7 % — ABNORMAL LOW (ref 39.0–52.0)
Hemoglobin: 10.9 g/dL — ABNORMAL LOW (ref 13.0–17.0)
MCH: 31.4 pg (ref 26.0–34.0)
MCHC: 31.4 g/dL (ref 30.0–36.0)
MCV: 100 fL (ref 80.0–100.0)
Platelets: 264 10*3/uL (ref 150–400)
RBC: 3.47 MIL/uL — ABNORMAL LOW (ref 4.22–5.81)
RDW: 15.9 % — ABNORMAL HIGH (ref 11.5–15.5)
WBC: 7.7 10*3/uL (ref 4.0–10.5)
nRBC: 0 % (ref 0.0–0.2)

## 2018-07-21 LAB — I-STAT TROPONIN, ED: Troponin i, poc: 0.01 ng/mL (ref 0.00–0.08)

## 2018-07-21 LAB — CBG MONITORING, ED
Glucose-Capillary: 95 mg/dL (ref 70–99)
Glucose-Capillary: 78 mg/dL (ref 70–99)

## 2018-07-21 MED ORDER — HEPARIN SODIUM (PORCINE) 1000 UNIT/ML DIALYSIS: 1000.0000 [IU] | INTRAMUSCULAR | Status: DC | PRN

## 2018-07-21 MED ORDER — CHLORHEXIDINE GLUCONATE CLOTH 2 % EX PADS
6.0000 | MEDICATED_PAD | Freq: Every day | CUTANEOUS | Status: DC
  Administered 2018-07-22 – 2018-07-23 (×2): 6 via TOPICAL

## 2018-07-21 MED ORDER — INSULIN ASPART 100 UNIT/ML ~~LOC~~ SOLN: 0.0000 [IU] | Freq: Every day | SUBCUTANEOUS | Status: DC

## 2018-07-21 MED ORDER — SIMVASTATIN 20 MG PO TABS
20.0000 mg | ORAL_TABLET | Freq: Every day | ORAL | Status: DC
  Administered 2018-07-23 – 2018-07-28 (×6): 20 mg via ORAL
  Filled 2018-07-21 (×6): qty 1

## 2018-07-21 MED ORDER — SODIUM CHLORIDE 0.9% FLUSH: 3.0000 mL | INTRAVENOUS | Status: DC | PRN

## 2018-07-21 MED ORDER — SEVELAMER CARBONATE 800 MG PO TABS
800.0000 mg | ORAL_TABLET | Freq: Every day | ORAL | Status: DC
  Administered 2018-07-23 – 2018-07-29 (×6): 800 mg via ORAL
  Filled 2018-07-21 (×7): qty 1

## 2018-07-21 MED ORDER — SODIUM CHLORIDE 0.9 % IV SOLN: 100.0000 mL | INTRAVENOUS | Status: DC | PRN

## 2018-07-21 MED ORDER — VITAMIN B-12 1000 MCG PO TABS
1000.0000 ug | ORAL_TABLET | Freq: Every day | ORAL | Status: DC
  Administered 2018-07-24 – 2018-07-29 (×6): 1000 ug via ORAL
  Filled 2018-07-21 (×8): qty 1

## 2018-07-21 MED ORDER — ONDANSETRON HCL 4 MG/2ML IJ SOLN
4.0000 mg | Freq: Four times a day (QID) | INTRAMUSCULAR | Status: DC | PRN
  Administered 2018-07-22: 4 mg via INTRAVENOUS

## 2018-07-21 MED ORDER — FENTANYL CITRATE (PF) 100 MCG/2ML IJ SOLN
12.5000 ug | INTRAMUSCULAR | Status: DC | PRN
  Administered 2018-07-21 – 2018-07-28 (×5): 25 ug via INTRAVENOUS
  Filled 2018-07-21 (×4): qty 2

## 2018-07-21 MED ORDER — SODIUM CHLORIDE 0.9% FLUSH
3.0000 mL | Freq: Two times a day (BID) | INTRAVENOUS | Status: DC
  Administered 2018-07-21 – 2018-07-29 (×12): 3 mL via INTRAVENOUS

## 2018-07-21 MED ORDER — DEXTROSE 10 % IV SOLN
INTRAVENOUS | Status: DC
  Administered 2018-07-22 – 2018-07-26 (×3): via INTRAVENOUS

## 2018-07-21 MED ORDER — ONDANSETRON HCL 4 MG PO TABS: 4.0000 mg | ORAL_TABLET | Freq: Four times a day (QID) | ORAL | Status: DC | PRN

## 2018-07-21 MED ORDER — PANTOPRAZOLE SODIUM 40 MG PO TBEC
40.0000 mg | DELAYED_RELEASE_TABLET | Freq: Every day | ORAL | Status: DC
  Administered 2018-07-24 – 2018-07-29 (×6): 40 mg via ORAL
  Filled 2018-07-21 (×6): qty 1

## 2018-07-21 MED ORDER — FLUOCINOLONE ACETONIDE SCALP 0.01 % EX OIL: 1.0000 "application " | TOPICAL_OIL | Freq: Every day | CUTANEOUS | Status: DC

## 2018-07-21 MED ORDER — MUPIROCIN 2 % EX OINT
1.0000 "application " | TOPICAL_OINTMENT | Freq: Two times a day (BID) | CUTANEOUS | Status: DC
  Administered 2018-07-21 – 2018-07-23 (×4): 1 via NASAL
  Filled 2018-07-21 (×3): qty 22

## 2018-07-21 MED ORDER — LIDOCAINE-PRILOCAINE 2.5-2.5 % EX CREA: 1.0000 "application " | TOPICAL_CREAM | CUTANEOUS | Status: DC | PRN

## 2018-07-21 MED ORDER — POLYVINYL ALCOHOL 1.4 % OP SOLN
1.0000 [drp] | OPHTHALMIC | Status: DC | PRN
  Filled 2018-07-21: qty 15

## 2018-07-21 MED ORDER — MEMANTINE HCL 5 MG PO TABS
10.0000 mg | ORAL_TABLET | Freq: Two times a day (BID) | ORAL | Status: DC
  Administered 2018-07-23 – 2018-07-29 (×11): 10 mg via ORAL
  Filled 2018-07-21: qty 2
  Filled 2018-07-21 (×2): qty 1
  Filled 2018-07-21 (×2): qty 2
  Filled 2018-07-21: qty 1
  Filled 2018-07-21: qty 2
  Filled 2018-07-21: qty 1
  Filled 2018-07-21: qty 2
  Filled 2018-07-21 (×5): qty 1
  Filled 2018-07-21: qty 2
  Filled 2018-07-21: qty 1
  Filled 2018-07-21: qty 2
  Filled 2018-07-21 (×2): qty 1

## 2018-07-21 MED ORDER — LEVOTHYROXINE SODIUM 50 MCG PO TABS
50.0000 ug | ORAL_TABLET | Freq: Every day | ORAL | Status: DC
  Administered 2018-07-24 – 2018-07-29 (×5): 50 ug via ORAL
  Filled 2018-07-21 (×6): qty 1

## 2018-07-21 MED ORDER — ALTEPLASE 2 MG IJ SOLR: 2.0000 mg | Freq: Once | INTRAMUSCULAR | Status: DC | PRN

## 2018-07-21 MED ORDER — ACETAMINOPHEN 325 MG PO TABS
650.0000 mg | ORAL_TABLET | Freq: Four times a day (QID) | ORAL | Status: DC | PRN
  Administered 2018-07-23 – 2018-07-28 (×11): 650 mg via ORAL
  Filled 2018-07-21 (×11): qty 2

## 2018-07-21 MED ORDER — INSULIN ASPART 100 UNIT/ML ~~LOC~~ SOLN
0.0000 [IU] | Freq: Three times a day (TID) | SUBCUTANEOUS | Status: DC
  Administered 2018-07-25: 2 [IU] via SUBCUTANEOUS
  Administered 2018-07-26: 1 [IU] via SUBCUTANEOUS
  Administered 2018-07-26 – 2018-07-27 (×3): 2 [IU] via SUBCUTANEOUS
  Administered 2018-07-28: 3 [IU] via SUBCUTANEOUS

## 2018-07-21 MED ORDER — ACETAMINOPHEN 650 MG RE SUPP: 650.0000 mg | Freq: Four times a day (QID) | RECTAL | Status: DC | PRN

## 2018-07-21 MED ORDER — LIDOCAINE HCL (PF) 1 % IJ SOLN: 5.0000 mL | INTRAMUSCULAR | Status: DC | PRN

## 2018-07-21 MED ORDER — HEPARIN SODIUM (PORCINE) 1000 UNIT/ML DIALYSIS
1800.0000 [IU] | Freq: Once | INTRAMUSCULAR | Status: DC
  Filled 2018-07-21: qty 2

## 2018-07-21 MED ORDER — DEXTROSE 50 % IV SOLN
INTRAVENOUS | Status: AC
  Administered 2018-07-21: 25 mL
  Filled 2018-07-21: qty 50

## 2018-07-21 MED ORDER — HEPARIN SODIUM (PORCINE) 1000 UNIT/ML DIALYSIS
1000.0000 [IU] | INTRAMUSCULAR | Status: DC | PRN
  Filled 2018-07-21: qty 1

## 2018-07-21 MED ORDER — SODIUM CHLORIDE 0.9% FLUSH
3.0000 mL | Freq: Two times a day (BID) | INTRAVENOUS | Status: DC
  Administered 2018-07-21 – 2018-07-29 (×13): 3 mL via INTRAVENOUS

## 2018-07-21 MED ORDER — DONEPEZIL HCL 10 MG PO TABS
10.0000 mg | ORAL_TABLET | Freq: Every day | ORAL | Status: DC
  Administered 2018-07-23 – 2018-07-28 (×6): 10 mg via ORAL
  Filled 2018-07-21 (×6): qty 1

## 2018-07-21 MED ORDER — PENTAFLUOROPROP-TETRAFLUOROETH EX AERO: 1.0000 "application " | INHALATION_SPRAY | CUTANEOUS | Status: DC | PRN

## 2018-07-21 MED ORDER — POLYETHYLENE GLYCOL 3350 17 G PO PACK
17.0000 g | PACK | Freq: Every day | ORAL | Status: DC
  Administered 2018-07-25 – 2018-07-27 (×3): 17 g via ORAL
  Filled 2018-07-21 (×4): qty 1

## 2018-07-21 MED ORDER — LIDOCAINE-PRILOCAINE 2.5-2.5 % EX CREA
1.0000 "application " | TOPICAL_CREAM | CUTANEOUS | Status: DC | PRN
  Filled 2018-07-21: qty 25

## 2018-07-21 MED ORDER — PENTAFLUOROPROP-TETRAFLUOROETH EX AERO
1.0000 "application " | INHALATION_SPRAY | CUTANEOUS | Status: DC | PRN
  Filled 2018-07-21: qty 103.5

## 2018-07-21 MED ORDER — SODIUM CHLORIDE 0.9 % IV SOLN: 250.0000 mL | INTRAVENOUS | Status: DC | PRN

## 2018-07-21 MED ORDER — LORAZEPAM 0.5 MG PO TABS
0.5000 mg | ORAL_TABLET | Freq: Once | ORAL | Status: AC
  Administered 2018-07-21: 0.5 mg via ORAL
  Filled 2018-07-21: qty 1

## 2018-07-21 NOTE — H&P (Signed)
History and Physical    [name] PRX:[id number] DOB: [birth year] DOA: 07/20/2018  PCP: Elby Showers, MD   Patient coming from: Home   Chief Complaint: Increased confusion, gait difficulty   HPI: [name] is a 77 y.o. male with medical history significant for type 2 diabetes mellitus, hypothyroidism, dementia, and end-stage renal disease on hemodialysis, now presenting to the emergency department with increased confusion.  Patient was admitted last month with GI bleeding and generalized weakness, was transfused, but has continued to experience generalized weakness and his wife has noted some difficulty with gait and reports that he has fallen multiple times over the last few months.  He frequently becomes confused at night, but family was concerned that he seemed to be more confused than usual throughout the day, prompting his presentation to the ED.  Patient reports a headache, described as dull, constant, and difficult to localize.  He reports hitting his head with a fall more than a month ago but denies any more recent head injury.  Patient denies fevers, chills, shortness of breath, or chest pain.  ED Course: Upon arrival to the ED, patient is found to be afebrile, saturating well on room air, and with vitals otherwise normal.  EKG features a sinus rhythm with PVCs and LAD.  Chest x-ray is notable for possible left lateral rib fractures involving the seventh, ninth, and 10th ribs.  Noncontrast head CT reveals large mixed attenuation bilateral subdural hematomas measuring 18 mm on the right, 15 mm on the left, likely acute on chronic, and with mass-effect causing concave deformity and sulcal effacement but no midline shift or herniation.  Chemistry panel is notable for normal potassium, normal bicarbonate, and BUN of 31.  CBC features a stable H&H.  Neurosurgery and trauma surgery were consulted by the ED physician.  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   Review of Systems:  All other systems reviewed and apart from HPI, are negative.  Past Medical History:  Diagnosis Date  . Allergy   . Anemia   . Arthritis   . Cataract    bil cateracts removed  . Coronary artery disease   . Diabetes mellitus    Type 2  . Diverticulitis   . ED (erectile dysfunction)   . 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 ESRD (end stage renal disease) on dialysis ([place]) 03/2015  . GERD (gastroesophageal reflux disease)    pepto   . Gout   . Hyperlipidemia   . Hypertension   . Hypothyroidism   . Pneumonia   . PVD (peripheral vascular disease) ([place])    has plastic aorta  . Renal insufficiency   . Seasonal allergies   . Shortness of breath dyspnea   . Sleep apnea    does not wear c-pap  . Thyroid disease     Past Surgical History:  Procedure Laterality Date  . aortobifemoral bypass    . AV FISTULA PLACEMENT Left 12/01/2013   Procedure: ARTERIOVENOUS (AV) FISTULA CREATION- LEFT BRACHIOCEPHALIC;  Surgeon: Angelia Mould, MD;  Location: [facility];  Service: Vascular;  Laterality: Left;  . [place] TRANSPOSITION Right 07/27/2014   Procedure: BASCILIC VEIN TRANSPOSITION;  Surgeon: Angelia Mould, MD;  Location: [facility];  Service: Vascular;  Laterality: Right;  . BREAST SURGERY     left - granulomatous mastitis  . COLONOSCOPY    . 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 ENTEROSCOPY N/A 06/03/2018   Procedure: ENTEROSCOPY;  Surgeon: Lavena Bullion, DO;  Location: [facility];  Service: Gastroenterology;  Laterality: N/A;  . EYE SURGERY Bilateral    cataracts  . HEMODIALYSIS INPATIENT  01/17/2018      . HOT HEMOSTASIS N/A 06/03/2018   Procedure: HOT HEMOSTASIS (ARGON PLASMA COAGULATION/BICAP);  Surgeon: Lavena Bullion, DO;  Location: [name] ENDOSCOPY;  Service: Gastroenterology;  Laterality: N/A;  . REVISON OF ARTERIOVENOUS FISTULA Left 02/09/2014   Procedure:  REVISON OF LEFT ARTERIOVENOUS FISTULA - RESECTION OF RENDUNDANT VEIN;  Surgeon: Angelia Mould, MD;  Location: [facility];  Service: Vascular;  Laterality: Left;  . SBO with lysis adhesions    . SHUNTOGRAM Left 04/19/2014   Procedure: FISTULOGRAM;  Surgeon: Angelia Mould, MD;  Location: [facility] CATH LAB;  Service: Cardiovascular;  Laterality: Left;  . UNILATERAL UPPER EXTREMEITY ANGIOGRAM N/A 07/12/2014   Procedure: UNILATERAL UPPER Anselmo Rod;  Surgeon: Angelia Mould, MD;  Location: [facility] CATH LAB;  Service: Cardiovascular;  Laterality: N/A;     reports that he quit smoking about 23 years ago. He has never used smokeless tobacco. He reports that he does not drink alcohol or use drugs.  Allergies  Allergen Reactions  . Penicillins Rash    Has patient had a PCN reaction causing immediate rash, facial/tongue/throat swelling, SOB or lightheadedness with hypotension: Yes Has patient had a PCN reaction causing severe rash involving mucus membranes or skin necrosis: Yes Has patient had a PCN reaction that required hospitalization: No Has patient had a PCN reaction occurring within the last 10 years: No If all of the above answers are "NO", then may proceed with Cephalosporin use.     Family History  Problem Relation Age of Onset  . Aneurysm Mother   . Heart disease Father   . Stroke Father   . Hypertension Father   . Diabetes Father   . Dementia Neg Hx   . Colon cancer Neg Hx   . Esophageal cancer Neg Hx   . Pancreatic cancer Neg Hx   . Prostate cancer Neg Hx   . Rectal cancer Neg Hx   . Stomach cancer Neg Hx      Prior to Admission medications   Medication Sig Start Date End Date Taking? Authorizing Provider  acetaminophen (TYLENOL) 500 MG tablet Take 1,000 mg by mouth 2 (two) times daily as needed (for pain).     [provider]  allopurinol (ZYLOPRIM) 100 MG tablet Take 100 mg by mouth daily as needed (pain).     [provider]  aspirin 325  MG tablet Take 325 mg by mouth daily.     [provider]  Blood Glucose Monitoring Suppl (FREESTYLE FREEDOM LITE) w/Device KIT Use to check blood sugar 2 times per day dx code E11.65 03/08/16   Elayne Snare, MD  Carboxymethylcellulose Sod PF 0.25 % SOLN Place 1 drop into both eyes as needed (dry eyes).    [provider]  clobetasol (TEMOVATE) 0.05 % external solution Apply 1 application topically 2 (two) times daily as needed (irritation). Apply to scalp 04/19/15   [provider]  denosumab (PROLIA) 60 MG/ML SOSY injection Inject 60 mg into the skin every 6 (six) months.    [provider]  donepezil (ARICEPT) 10 MG tablet Take 1 tablet (10 mg total) by mouth at bedtime. 05/06/18   Dennie Bible, NP  FLUOCINOLONE ACETONIDE SCALP 0.01 % OIL Apply 1 application  topically at bedtime. Apply to scalp 04/28/15   [provider]  fluticasone (CUTIVATE) 0.05 % cream Apply 1 application topically 2 (two) times daily as needed (irritation). Apply to face 04/19/15   [provider]  fluticasone (FLONASE) 50 MCG/ACT nasal spray Place 1 spray into both nostrils daily. Patient taking differently: Place 1 spray into both nostrils daily as needed for allergies.  10/08/14   Mikhail, Velta Addison, DO  glucose blood (FREESTYLE LITE) test strip Use as instructed to check blood sugar 3 times daily. 04/22/18   Elayne Snare, MD  glucose blood (ONETOUCH VERIO) test strip Use to check blood sugar 2 times per day. Dx code E11.65 03/01/16   Elayne Snare, MD  Insulin Pen Needle 31G X 5 MM MISC Use with Victoza pen once a day 04/22/18   Elayne Snare, MD  ketoconazole (NIZORAL) 2 % shampoo Apply 1 application topically 2 (two) times a week. Patient taking differently: Apply 1 application topically at bedtime.  03/31/15   Elby Showers, MD  Lancets (FREESTYLE) lancets Use as instructed to check blood sugar 2 times per day dx code E11.65 02/22/16   Elayne Snare, MD  levothyroxine  (SYNTHROID, LEVOTHROID) 50 MCG tablet Take 1 tablet (50 mcg total) by mouth daily. 08/06/14   Elby Showers, MD  liraglutide (VICTOZA) 18 MG/3ML SOPN Inject 0.1 mLs (0.6 mg total) into the skin daily. Patient taking differently: Inject 0.6 mg into the skin at bedtime.  05/27/18   Elayne Snare, MD  loratadine (CLARITIN) 10 MG tablet Take 10 mg by mouth daily as needed for allergies.     [provider]  memantine [facility] TITRATION PACK) tablet pack 5 mg/day for =1 week; 5 mg twice daily for =1 week; 15 mg/day given in 5 mg and 10 mg separated doses for =1 week; then 10 mg twice daily 07/15/18   Dennie Bible, NP  midodrine (PROAMATINE) 10 MG tablet Take 10 mg by mouth 3 (three) times daily.    [provider]  Naftifine HCl (NAFTIN) 2 % CREA Apply to feet daily for fungal infection Patient taking differently: Apply 1 application topically daily as needed (for fungal infection). Apply to feet 09/12/15   Baxley, Cresenciano Lick, MD  nystatin-triamcinolone (MYCOLOG II) cream Apply 1 application topically 2 (two) times daily. 09/26/17   Elby Showers, MD  omeprazole (PRILOSEC) 40 MG capsule Take 1 capsule (40 mg total) by mouth daily. 03/13/18   Esterwood, Amy S, PA-C  polyethylene glycol (MIRALAX / GLYCOLAX) packet Take 17 g by mouth daily.    [provider]  RENVELA 800 MG tablet Take 800 mg by mouth daily with supper.  07/27/14   [provider]  simvastatin (ZOCOR) 20 MG tablet Take 20 mg by mouth at bedtime.     [provider]  vitamin B-12 (CYANOCOBALAMIN) 1000 MCG tablet Take 1 tablet (1,000 mcg total) by mouth daily. 06/03/18   Patrecia Pour, MD    Physical Exam: Vitals:   07/20/18 2220 07/20/18 2353 07/21/18 0028  BP: 137/79    Pulse: 77  81  Resp: 18  18  Temp:  98.5 F (36.9 C)   TempSrc:  Rectal   SpO2: 99%  100%  Weight: 68.9 kg    Height: 5' 11"  (1.803 m)      Constitutional: NAD, calm  Eyes: PERTLA, lids and conjunctivae normal ENMT:  Mucous membranes are moist. Posterior pharynx clear of any exudate or lesions.   Neck: normal, supple,  no masses, no thyromegaly Respiratory: clear to auscultation bilaterally, no wheezing, no crackles. Normal respiratory effort.    Cardiovascular: S1 & S2 heard, regular rate and rhythm. No extremity edema.  Abdomen: No distension, no tenderness, soft. Bowel sounds normal.  Musculoskeletal: no clubbing / cyanosis. No joint deformity upper and lower extremities.    Skin: no significant rashes, lesions, ulcers. Warm, dry, well-perfused. Neurologic: No facial asymmetry, PERRL, EOMI. Sensation to light touch intact. Moving all extremities spontaneously.  Psychiatric: Alert and oriented to person, place, month, and situation. Pleasant and cooperative.    Labs on Admission: I have personally reviewed following labs and imaging studies  CBC: Recent Labs  Lab 07/21/18 0129 07/21/18 0151  WBC 8.6  --   NEUTROABS 5.3  --   HGB 10.6* 11.9*  HCT 32.7* 35.0*  MCV 100.0  --   PLT 248  --    Basic Metabolic Panel: Recent Labs  Lab 07/21/18 0129 07/21/18 0151  NA 136 134*  K 3.7 3.6  CL 96* 95*  CO2 31  --   GLUCOSE 107* 101*  BUN 31* 37*  CREATININE 7.36* 8.10*  CALCIUM 11.2*  --    GFR: Estimated Creatinine Clearance: 7.6 mL/min (A) (by C-G formula based on SCr of 8.1 mg/dL (H)). Liver Function Tests: Recent Labs  Lab 07/21/18 0129  AST 35  ALT 13  ALKPHOS 84  BILITOT 0.8  PROT 5.1*  ALBUMIN 2.5*   No results for input(s): LIPASE, AMYLASE in the last 168 hours. No results for input(s): AMMONIA in the last 168 hours. Coagulation Profile: No results for input(s): INR, PROTIME in the last 168 hours. Cardiac Enzymes: No results for input(s): CKTOTAL, CKMB, CKMBINDEX, TROPONINI in the last 168 hours. BNP (last 3 results) No results for input(s): PROBNP in the last 8760 hours. HbA1C: No results for input(s): HGBA1C in the last 72 hours. CBG: No results for input(s): GLUCAP  in the last 168 hours. Lipid Profile: No results for input(s): CHOL, HDL, LDLCALC, TRIG, CHOLHDL, LDLDIRECT in the last 72 hours. Thyroid Function Tests: No results for input(s): TSH, T4TOTAL, FREET4, T3FREE, THYROIDAB in the last 72 hours. Anemia Panel: No results for input(s): VITAMINB12, FOLATE, FERRITIN, TIBC, IRON, RETICCTPCT in the last 72 hours. Urine analysis:    Component Value Date/Time   COLORURINE YELLOW 01/23/2018 2043   APPEARANCEUR HAZY (A) 01/23/2018 2043   LABSPEC 1.015 01/23/2018 2043   PHURINE 9.0 (H) 01/23/2018 2043   GLUCOSEU NEGATIVE 01/23/2018 2043   HGBUR NEGATIVE 01/23/2018 2043   BILIRUBINUR NEGATIVE 01/23/2018 2043   BILIRUBINUR neg 02/14/2015 [place] 01/23/2018 2043   PROTEINUR 100 (A) 01/23/2018 2043   UROBILINOGEN 0.2 02/20/2015 0944   NITRITE NEGATIVE 01/23/2018 2043   LEUKOCYTESUR TRACE (A) 01/23/2018 2043   Sepsis Labs: @LABRCNTIP (procalcitonin:4,lacticidven:4) )No results found for this or any previous visit (from the past 240 hour(s)).   Radiological Exams on Admission: Dg Ribs Unilateral W/chest Left  Result Date: 07/20/2018 CLINICAL DATA:  Fall 2 days ago with left rib pain. EXAM: LEFT RIBS AND CHEST - 3+ VIEW COMPARISON:  None. FINDINGS: Lungs are hypoinflated without consolidation, effusion or pneumothorax. Cardiomediastinal silhouette is within normal. Findings suggest subtle fractures of the lateral left tenth rib and anterolateral left ninth rib. Possible lateral left seventh rib fracture. IMPRESSION: No acute cardiopulmonary disease. Possible subtle fractures of the left seventh, ninth and tenth ribs as described. Electronically Signed   By: Marin Olp M.D.   On: 07/20/2018 23:37  Ct Head Wo Contrast  Result Date: 07/20/2018 CLINICAL DATA:  77 y/o M; altered mental status. Unclear cause. Several falls today. EXAM: CT HEAD WITHOUT CONTRAST TECHNIQUE: Contiguous axial images were obtained from the base of the skull  through the vertex without intravenous contrast. COMPARISON:  04/12/2015 MRI head. FINDINGS: Brain: Bilateral subdural hematomas on the right measuring up to 18 mm in thickness on the left up to 15 mm in thickness (series 5, image 37). The subdural hematomas are largely hypointense, however, there is gradient increased attenuation of the right-sided collection and and high attenuation retracted clot in the left-sided collection indicating recent hemorrhage. Mass effect on the brain results in diffuse sulcal effacement. There is no midline shift or downward herniation this time. No findings of stroke, brain parenchymal hemorrhage, or focal mass effect of the brain. Moderate volume loss of the brain is stable from prior MRI of the head given differences in technique. There is a punctate calcification in the right frontal lobe compatible sequelae of prior infectious or inflammatory process. Vascular: Calcific atherosclerosis of the carotid siphons and vertebral arteries. No hyperdense vessel identified. Skull: Normal. Negative for fracture or focal lesion. Sinuses/Orbits: No acute finding. Other: Bilateral intra-ocular lens replacement. IMPRESSION: Large mixed attenuation bilateral subdural hematomas measuring up to 18 mm in thickness on the right and 15 mm on the left, likely acute on chronic. Mass effect on the brain with concave deformation and sulcal effacement. No midline shift or herniation. Critical Value/emergent results were called by telephone at the time of interpretation on 07/20/2018 at 11:55 pm to Dr. Quincy Carnes , who verbally acknowledged these results. Electronically Signed   By: [name] M.D.   On: 07/20/2018 23:57    EKG: Independently reviewed. Sinus rhythm, PVC's, LAD.   Assessment/Plan  1. Subdural hematomas  - Presents with increased confusion and recurrent falls  - Found to have bilateral SDH, appears to be acute on chronic with mass-effect causing concave deformity and  sulcal effacement, but no MLS or herniation  - Neurosurgery is consulting and much appreciated, will follow-up on recommendations, continue supportive care and monitoring for now    2. Multiple rib fractures  - Possible left lateral 7th, 9th, and 10th rib fractures noted on radiographs from ED  - Not very tender on exam, denies pleuritic pain  - As-needed analgesics, incentive spirometry    3. ESRD  - No hyperkalemia, acidosis, uremia, hypertension, or vol o/l on admission  - Typically dialyzed MWF and reports completing session on 10/25 without incident  - SLIV, continue sevelamer    4. Dementia  - Continue Aricept and Namenda   5. Hypothyroidism  - Continue Synthroid   6. Type II DM  - A1c was only 5.2% in September, attributed to anemia and blood transfusions by his endocrinologist   - Hold Victoza, check CBG's, and use a low-intensity SSI as needed while in hospital    DVT prophylaxis: SCD's  Code Status: Full  Family Communication: Family updated at bedside  Consults called: Neurosurgery and trauma consulted by ED physician  Admission status: Observation     Vianne Bulls, MD [facility] Pager [phone]  If 7PM-7AM, please contact night-coverage [email or web address] Password [email or web address]  07/21/2018, 3:03 AM

## 2018-07-21 NOTE — Telephone Encounter (Signed)
Wife is calling to request your opinion regarding her husbands current status.  They are wanting to do surgery on him.  Two options were given:  1 is a serious brain surgery and they are not sure he can survive that surgery.  The 2nd option is a less serious brain surgery where they will attempt to remove some fluid off of his brain and he may/may not survive that surgery.  However, if they do nothing he will be in a vegetative state.  So, she is just wanting to chat with you and I think get a little reassurance as to what you think would be the best option for him at this point.    Best to contact her on her cell phone as she is going back and forth from the house to the hospital right now.    Phone :  [phone]  Thank you.

## 2018-07-21 NOTE — Consult Note (Signed)
[facility] Renal Consultation Note    Indication for Consultation:  Management of ESRD/hemodialysis; anemia, hypertension/volume and secondary hyperparathyroidism PCP: Dr. Tommie Ard Baxley  HPI: [name] is a 77 y.o. male with ESRD on hemodialysis MWF at [facility]. 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   Patient presented to ED this AM with C/O generalized weakness, increased confusion, multiple falls, and headache. Labs unremarkable. CXR showed Possible subtle fractures of the left seventh, ninth and tenth ribs. CT of head showed Large mixed attenuation bilateral subdural hematomas measuring up to18 mm in thickness on the right and 15 mm on the left, likely acuteon chronic. Mass effect on the brain with concave deformation and sulcal effacement. No midline shift or herniation. He has been seen by neurosurgery. To be evaluated this afternoon for possible surgical intervention after HD.   Patient is awake, alert, very pleasant, oriented to self only. C/O headache, asking if his HD treatment can be shortened. Cannot provide details pertinent to HPI. Family not present. HPI gathered from EMR.   Past Medical History:  Diagnosis Date  . Allergy   . Anemia   . Arthritis   . Cataract    bil cateracts removed  . Coronary artery disease   . Diabetes mellitus    Type 2  . Diverticulitis   . ED (erectile dysfunction)   . Elevated homocysteine ([place])   . ESRD (end stage renal disease) on dialysis ([place]) 03/2015  . GERD (gastroesophageal reflux disease)    pepto   . Gout   . Hyperlipidemia   . Hypertension   . Hypothyroidism   . Pneumonia   . PVD (peripheral vascular disease) ([place])    has plastic aorta  . Renal insufficiency   . Seasonal allergies   . Shortness of breath dyspnea   . Sleep apnea    does not wear c-pap  . Thyroid disease    Past Surgical History:  Procedure Laterality Date  .  aortobifemoral bypass    . AV FISTULA PLACEMENT Left 12/01/2013   Procedure: ARTERIOVENOUS (AV) FISTULA CREATION- LEFT BRACHIOCEPHALIC;  Surgeon: Angelia Mould, MD;  Location: [place];  Service: Vascular;  Laterality: Left;  . [place] TRANSPOSITION Right 07/27/2014   Procedure: BASCILIC VEIN TRANSPOSITION;  Surgeon: Angelia Mould, MD;  Location: [place];  Service: Vascular;  Laterality: Right;  . BREAST SURGERY     left - granulomatous mastitis  . COLONOSCOPY    . 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 ENTEROSCOPY N/A 06/03/2018   Procedure: ENTEROSCOPY;  Surgeon: Lavena Bullion, DO;  Location: [place];  Service: Gastroenterology;  Laterality: N/A;  . EYE SURGERY Bilateral    cataracts  . HEMODIALYSIS INPATIENT  01/17/2018      . HOT HEMOSTASIS N/A 06/03/2018   Procedure: HOT HEMOSTASIS (ARGON PLASMA COAGULATION/BICAP);  Surgeon: Lavena Bullion, DO;  Location: [facility] ENDOSCOPY;  Service: Gastroenterology;  Laterality: N/A;  . REVISON OF ARTERIOVENOUS FISTULA Left 02/09/2014   Procedure: REVISON OF LEFT ARTERIOVENOUS FISTULA - RESECTION OF RENDUNDANT VEIN;  Surgeon: Angelia Mould, MD;  Location: [place];  Service: Vascular;  Laterality: Left;  . SBO with lysis adhesions    . SHUNTOGRAM Left 04/19/2014   Procedure: FISTULOGRAM;  Surgeon: Angelia Mould, MD;  Location: [facility] CATH LAB;  Service: Cardiovascular;  Laterality: Left;  . UNILATERAL UPPER EXTREMEITY ANGIOGRAM N/A 07/12/2014   Procedure:  UNILATERAL UPPER Anselmo Rod;  Surgeon: Angelia Mould, MD;  Location: [facility] CATH LAB;  Service: Cardiovascular;  Laterality: N/A;   Family History  Problem Relation Age of Onset  . Aneurysm Mother   . Heart disease Father   . Stroke Father   . Hypertension Father   . Diabetes Father   . Dementia Neg Hx   . Colon cancer Neg Hx   . Esophageal cancer Neg Hx   . Pancreatic cancer Neg Hx   . Prostate cancer Neg Hx   . Rectal cancer Neg Hx    . Stomach cancer Neg Hx    Social History:  reports that he quit smoking about 23 years ago. He has never used smokeless tobacco. He reports that he does not drink alcohol or use drugs. Allergies  Allergen Reactions  . Penicillins Rash    Has patient had a PCN reaction causing immediate rash, facial/tongue/throat swelling, SOB or lightheadedness with hypotension: Yes Has patient had a PCN reaction causing severe rash involving mucus membranes or skin necrosis: Yes Has patient had a PCN reaction that required hospitalization: No Has patient had a PCN reaction occurring within the last 10 years: No If all of the above answers are "NO", then may proceed with Cephalosporin use.    Prior to Admission medications   Medication Sig Start Date End Date Taking? Authorizing Provider  acetaminophen (TYLENOL) 500 MG tablet Take 1,000 mg by mouth 2 (two) times daily as needed (for pain).    Yes [provider]  allopurinol (ZYLOPRIM) 100 MG tablet Take 100 mg by mouth daily as needed (pain).    Yes [provider]  aspirin 325 MG tablet Take 325 mg by mouth daily.    Yes [provider]  Carboxymethylcellulose Sod PF 0.25 % SOLN Place 1 drop into both eyes as needed (dry eyes).   Yes [provider]  clobetasol (TEMOVATE) 0.05 % external solution Apply 1 application topically 2 (two) times daily as needed (irritation). Apply to scalp 04/19/15  Yes [provider]  denosumab (PROLIA) 60 MG/ML SOSY injection Inject 60 mg into the skin every 6 (six) months.   Yes [provider]  donepezil (ARICEPT) 10 MG tablet Take 1 tablet (10 mg total) by mouth at bedtime. 05/06/18  Yes Dennie Bible, NP  FLUOCINOLONE ACETONIDE SCALP 0.01 % OIL Apply 1 application topically at bedtime. Apply to scalp 04/28/15  Yes [provider]  fluticasone (CUTIVATE) 0.05 % cream Apply 1 application topically 2 (two) times daily as needed (irritation). Apply to face  04/19/15  Yes [provider]  fluticasone (FLONASE) 50 MCG/ACT nasal spray Place 1 spray into both nostrils daily. Patient taking differently: Place 1 spray into both nostrils daily as needed for allergies.  10/08/14  Yes Mikhail, Velta Addison, DO  ketoconazole (NIZORAL) 2 % shampoo Apply 1 application topically 2 (two) times a week. Patient taking differently: Apply 1 application topically at bedtime.  03/31/15  Yes Baxley, Cresenciano Lick, MD  levothyroxine (SYNTHROID, LEVOTHROID) 50 MCG tablet Take 1 tablet (50 mcg total) by mouth daily. 08/06/14  Yes BaxleyCresenciano Lick, MD  linaGLIPtin (TRADJENTA PO) Take 5 mg by mouth daily.   Yes [provider]  loratadine (CLARITIN) 10 MG tablet Take 10 mg by mouth daily as needed for allergies.    Yes [provider]  midodrine (PROAMATINE) 10 MG tablet Take 10 mg by mouth 3 (three) times daily.   Yes [provider]  Multiple Vitamins-Minerals (  MULTIVITAMIN WITH MINERALS) tablet Take 1 tablet by mouth daily.   Yes [provider]  Naftifine HCl (NAFTIN) 2 % CREA Apply to feet daily for fungal infection Patient taking differently: Apply 1 application topically daily as needed (for fungal infection). Apply to feet 09/12/15  Yes Baxley, Cresenciano Lick, MD  Nutritional Supplements (FEEDING SUPPLEMENT, NEPRO CARB STEADY,) LIQD Take 237 mLs by mouth daily.   Yes [provider]  omeprazole (PRILOSEC) 40 MG capsule Take 1 capsule (40 mg total) by mouth daily. 03/13/18  Yes Esterwood, Amy S, PA-C  polyethylene glycol (MIRALAX / GLYCOLAX) packet Take 17 g by mouth daily as needed for mild constipation.    Yes [provider]  RENVELA 800 MG tablet Take 1,600 mg by mouth 3 (three) times daily with meals.  07/27/14  Yes [provider]  simvastatin (ZOCOR) 20 MG tablet Take 20 mg by mouth at bedtime.    Yes [provider]  vitamin B-12 (CYANOCOBALAMIN) 1000 MCG tablet Take 1 tablet (1,000 mcg total) by mouth daily.  06/03/18  Yes Patrecia Pour, MD  Blood Glucose Monitoring Suppl (FREESTYLE FREEDOM LITE) w/Device KIT Use to check blood sugar 2 times per day dx code E11.65 03/08/16   Elayne Snare, MD  glucose blood (FREESTYLE LITE) test strip Use as instructed to check blood sugar 3 times daily. 04/22/18   Elayne Snare, MD  glucose blood (ONETOUCH VERIO) test strip Use to check blood sugar 2 times per day. Dx code E11.65 03/01/16   Elayne Snare, MD  Insulin Pen Needle 31G X 5 MM MISC Use with Victoza pen once a day 04/22/18   Elayne Snare, MD  Lancets (FREESTYLE) lancets Use as instructed to check blood sugar 2 times per day dx code E11.65 02/22/16   Elayne Snare, MD  liraglutide (VICTOZA) 18 MG/3ML SOPN Inject 0.1 mLs (0.6 mg total) into the skin daily. Patient not taking: Reported on 07/21/2018 05/27/18   Elayne Snare, MD  memantine [facility] TITRATION PACK) tablet pack 5 mg/day for =1 week; 5 mg twice daily for =1 week; 15 mg/day given in 5 mg and 10 mg separated doses for =1 week; then 10 mg twice daily 07/15/18   Dennie Bible, NP  nystatin-triamcinolone [facility] II) cream Apply 1 application topically 2 (two) times daily. Patient not taking: Reported on 07/21/2018 09/26/17   Elby Showers, MD   Current Facility-Administered Medications  Medication Dose Route Frequency Provider Last Rate Last Dose  . 0.9 %  sodium chloride infusion  250 mL Intravenous PRN Opyd, Ilene Qua, MD      . 0.9 %  sodium chloride infusion  100 mL Intravenous PRN Deterding, Jeneen Rinks, MD      . 0.9 %  sodium chloride infusion  100 mL Intravenous PRN Deterding, Jeneen Rinks, MD      . acetaminophen (TYLENOL) tablet 650 mg  650 mg Oral Q6H PRN Opyd, Ilene Qua, MD       Or  . acetaminophen (TYLENOL) suppository 650 mg  650 mg Rectal Q6H PRN Opyd, Ilene Qua, MD      . alteplase (CATHFLO ACTIVASE) injection 2 mg  2 mg Intracatheter Once PRN Deterding, Jeneen Rinks, MD      . Chlorhexidine Gluconate Cloth 2 % PADS 6 each  6 each Topical Q0600 Valentina Gu, NP      . donepezil (ARICEPT) tablet 10 mg  10 mg Oral QHS Opyd, Ilene Qua, MD      . fentaNYL (SUBLIMAZE) injection  12.5-25 mcg  12.5-25 mcg Intravenous Q2H PRN Vianne Bulls, MD   25 mcg at 07/21/18 0743  . heparin injection 1,000 Units  1,000 Units Dialysis PRN Deterding, Jeneen Rinks, MD      . heparin injection 1,800 Units  1,800 Units Dialysis Once in dialysis Valentina Gu, NP      . insulin aspart (novoLOG) injection 0-5 Units  0-5 Units Subcutaneous QHS Opyd, Ilene Qua, MD      . insulin aspart (novoLOG) injection 0-9 Units  0-9 Units Subcutaneous TID WC Opyd, Ilene Qua, MD      . levothyroxine (SYNTHROID, LEVOTHROID) tablet 50 mcg  50 mcg Oral QAC breakfast Opyd, Ilene Qua, MD      . lidocaine (PF) (XYLOCAINE) 1 % injection 5 mL  5 mL Intradermal PRN Deterding, Jeneen Rinks, MD      . lidocaine-prilocaine (EMLA) cream 1 application  1 application Topical PRN Deterding, Jeneen Rinks, MD      . memantine [facility]) tablet 10 mg  10 mg Oral BID Opyd, Ilene Qua, MD      . ondansetron (ZOFRAN) tablet 4 mg  4 mg Oral Q6H PRN Opyd, Ilene Qua, MD       Or  . ondansetron (ZOFRAN) injection 4 mg  4 mg Intravenous Q6H PRN Opyd, Ilene Qua, MD      . pantoprazole (PROTONIX) EC tablet 40 mg  40 mg Oral Daily Opyd, Ilene Qua, MD      . pentafluoroprop-tetrafluoroeth (GEBAUERS) aerosol 1 application  1 application Topical PRN Deterding, Jeneen Rinks, MD      . polyethylene glycol (MIRALAX / GLYCOLAX) packet 17 g  17 g Oral Daily Opyd, Ilene Qua, MD      . polyvinyl alcohol (LIQUIFILM TEARS) 1.4 % ophthalmic solution 1 drop  1 drop Both Eyes PRN Opyd, Ilene Qua, MD      . sevelamer carbonate (RENVELA) tablet 800 mg  800 mg Oral Q supper Opyd, Ilene Qua, MD      . simvastatin (ZOCOR) tablet 20 mg  20 mg Oral QHS Opyd, Ilene Qua, MD      . sodium chloride flush (NS) 0.9 % injection 3 mL  3 mL Intravenous Q12H Opyd, Ilene Qua, MD   3 mL at 07/21/18 0501  . sodium chloride flush (NS) 0.9 % injection 3 mL  3 mL  Intravenous Q12H Opyd, Timothy S, MD      . sodium chloride flush (NS) 0.9 % injection 3 mL  3 mL Intravenous PRN Opyd, Ilene Qua, MD      . vitamin B-12 (CYANOCOBALAMIN) tablet 1,000 mcg  1,000 mcg Oral Daily Opyd, Ilene Qua, MD       Labs: Basic Metabolic Panel: Recent Labs  Lab 07/21/18 0129 07/21/18 0151  NA 136 134*  K 3.7 3.6  CL 96* 95*  CO2 31  --   GLUCOSE 107* 101*  BUN 31* 37*  CREATININE 7.36* 8.10*  CALCIUM 11.2*  --    Liver Function Tests: Recent Labs  Lab 07/21/18 0129  AST 35  ALT 13  ALKPHOS 84  BILITOT 0.8  PROT 5.1*  ALBUMIN 2.5*   No results for input(s): LIPASE, AMYLASE in the last 168 hours. No results for input(s): AMMONIA in the last 168 hours. CBC: Recent Labs  Lab 07/21/18 0129 07/21/18 0151  WBC 8.6  --   NEUTROABS 5.3  --   HGB 10.6* 11.9*  HCT 32.7* 35.0*  MCV 100.0  --   PLT 248  --  Cardiac Enzymes: No results for input(s): CKTOTAL, CKMB, CKMBINDEX, TROPONINI in the last 168 hours. CBG: Recent Labs  Lab 07/21/18 0701 07/21/18 1119  GLUCAP 95 78   Iron Studies: No results for input(s): IRON, TIBC, TRANSFERRIN, FERRITIN in the last 72 hours. Studies/Results: Dg Ribs Unilateral W/chest Left  Result Date: 07/20/2018 CLINICAL DATA:  Fall 2 days ago with left rib pain. EXAM: LEFT RIBS AND CHEST - 3+ VIEW COMPARISON:  None. FINDINGS: Lungs are hypoinflated without consolidation, effusion or pneumothorax. Cardiomediastinal silhouette is within normal. Findings suggest subtle fractures of the lateral left tenth rib and anterolateral left ninth rib. Possible lateral left seventh rib fracture. IMPRESSION: No acute cardiopulmonary disease. Possible subtle fractures of the left seventh, ninth and tenth ribs as described. Electronically Signed   By: Marin Olp M.D.   On: 07/20/2018 23:37   Ct Head Wo Contrast  Result Date: 07/20/2018 CLINICAL DATA:  77 y/o M; altered mental status. Unclear cause. Several falls today. EXAM: CT HEAD  WITHOUT CONTRAST TECHNIQUE: Contiguous axial images were obtained from the base of the skull through the vertex without intravenous contrast. COMPARISON:  04/12/2015 MRI head. FINDINGS: Brain: Bilateral subdural hematomas on the right measuring up to 18 mm in thickness on the left up to 15 mm in thickness (series 5, image 37). The subdural hematomas are largely hypointense, however, there is gradient increased attenuation of the right-sided collection and and high attenuation retracted clot in the left-sided collection indicating recent hemorrhage. Mass effect on the brain results in diffuse sulcal effacement. There is no midline shift or downward herniation this time. No findings of stroke, brain parenchymal hemorrhage, or focal mass effect of the brain. Moderate volume loss of the brain is stable from prior MRI of the head given differences in technique. There is a punctate calcification in the right frontal lobe compatible sequelae of prior infectious or inflammatory process. Vascular: Calcific atherosclerosis of the carotid siphons and vertebral arteries. No hyperdense vessel identified. Skull: Normal. Negative for fracture or focal lesion. Sinuses/Orbits: No acute finding. Other: Bilateral intra-ocular lens replacement. IMPRESSION: Large mixed attenuation bilateral subdural hematomas measuring up to 18 mm in thickness on the right and 15 mm on the left, likely acute on chronic. Mass effect on the brain with concave deformation and sulcal effacement. No midline shift or herniation. Critical Value/emergent results were called by telephone at the time of interpretation on 07/20/2018 at 11:55 pm to Dr. Quincy Carnes , who verbally acknowledged these results. Electronically Signed   By: [name] M.D.   On: 07/20/2018 23:57    ROS: As per HPI otherwise negative.   Physical Exam: Vitals:   07/21/18 0830 07/21/18 1330 07/21/18 1332 07/21/18 1335  BP: (!) 168/76 (!) 178/86 (!) 201/75 (!) 199/82   Pulse: 73 78 75 75  Resp: 16 16    Temp:  (!) 97.5 F (36.4 C)    TempSrc:  Oral    SpO2: 98% 98%    Weight:  69 kg    Height:         General: Pleasant elderly male in no acute distress. Head: Normocephalic, atraumatic, sclera non-icteric, mucus membranes are moist Neck: Supple. JVD not elevated. Lungs: Clear bilaterally to auscultation without wheezes, rales, or rhonchi. Breathing is unlabored. Heart: RRR with S1 S2. No murmurs, rubs, or gallops appreciated. Abdomen: Soft, non-tender, non-distended with normoactive bowel sounds. No rebound/guarding. No obvious abdominal masses. M-S:  Strength and tone appear normal for age. Lower extremities:without edema or ischemic changes,  no open wounds  Neuro: Alert and oriented X 3. Moves all extremities spontaneously. Psych:  Responds to questions appropriately with a normal affect. Dialysis Access: R AVF + bruit/thrill  Dialysis Orders: [facility]WF [facility] 4 hrs 180 NRe 400/Autoflow 1.5 71 kg 2.0K/2.0 Ca  R AVF -Heparin 1800 units IV -Mircera 150 mcg IV q 2 weeks (last dose 07/16/18 Last HGB 11.4 07/16/18)  Assessment/Plan: 1.  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  2.  Multiple rib fractures-per primary. Does not seem to be painful.  3.  ESRD -  MWF HD today. No heparin. K+ 4.7 4.  Hypertension/volume  - Hypertensive at present. Avoid hypotension while on HD. Pre wt 68.9 kg-has been leaving under EDW at OP center. UFG 2.5 liters. 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  5.  Anemia  - HGB 9.1. Recent ESA dose 06/2318, completed Fe Load. Follow HGB.  6.  Metabolic bone disease -  Ca 8.7. NPO at present. Continue binders when able to eat.  7.  Nutrition -NPO  8.  DM-per primary 9.   Hypothyroidism-per primary 10  Dementia-per primary  [employer] Owens Shark, NP-C 07/21/2018, 1:56 PM  [employer] [phone]

## 2018-07-21 NOTE — ED Provider Notes (Signed)
Patient seen/examined in the Emergency Department in conjunction with Midlevel Provider [place] Patient with recent increased falls, and has been more confused. Exam : Awake and alert, does appear confused.  No C-spine tenderness, no significant chest or abdominal tenderness. Plan: CT head revealed subdural hematoma, chest x-ray reveals rib fractures.  Neurosurgery and trauma consult requested.  I attempted to place bilateral EJ IVs, without success.   Ripley Fraise, MD 07/21/18 [phone]

## 2018-07-21 NOTE — Progress Notes (Signed)
[name] is a 77 y.o. male with medical history significant for type 2 diabetes mellitus, hypothyroidism, dementia, and end-stage renal disease on hemodialysis, now presenting to the emergency department with increased confusion.  Patient was admitted last month with GI bleeding and generalized weakness, was transfused, but has continued to experience generalized weakness and his wife has noted some difficulty with gait and reports that he has fallen multiple times over the last few months. He frequently becomes confused at night, but family was concerned that he seemed to be more confused than usual throughout the day, prompting his presentation to the ED.  Patient reports a headache, described as dull, constant, and difficult to localize.  He reports hitting his head with a fall more than a month ago but denies any more recent head injury.  Patient denies fevers, chills, shortness of breath, or chest pain.  CT head with no contrast done on presentation revealed bilateral subdural hematoma.  Neurosurgery consulted and following.  07/21/2018: Patient seen and examined at bedside.  He is alert but confused.  Neurosurgery plan for possible bur hole drainage and will reassess this afternoon.  Please refer to H&P dictated by Dr. Myna Hidalgo on 07/21/2018 for further details of the assessment and plan.

## 2018-07-21 NOTE — ED Notes (Signed)
Pt does not make urine per wife, unable to obtain UA

## 2018-07-21 NOTE — Telephone Encounter (Signed)
Returned call to Mrs. [name]. Reviewed CT findings with her and options that have been well outlined by Dr. Ellene Route. Difficult situation. She will not be able to care for him at home.

## 2018-07-21 NOTE — Consult Note (Signed)
Reason for Consult:bilateral subdural hematomas Referring Physician: [name] is an 77 y.o. male.  HPI: 77 yo male with altered mental status presents with bilateral chronic subdural hematomas with some acute blood. He has a number of medical conditions also and hospitalist is admitting the patient who may or may not be a surgical candidate.  Past Medical History:  Diagnosis Date  . Allergy   . Anemia   . Arthritis   . Cataract    bil cateracts removed  . Coronary artery disease   . Diabetes mellitus    Type 2  . Diverticulitis   . ED (erectile dysfunction)   . Elevated homocysteine ([place])   . ESRD (end stage renal disease) on dialysis ([place]) 03/2015  . GERD (gastroesophageal reflux disease)    pepto   . Gout   . Hyperlipidemia   . Hypertension   . Hypothyroidism   . Pneumonia   . PVD (peripheral vascular disease) ([place])    has plastic aorta  . Renal insufficiency   . Seasonal allergies   . Shortness of breath dyspnea   . Sleep apnea    does not wear c-pap  . Thyroid disease     Past Surgical History:  Procedure Laterality Date  . aortobifemoral bypass    . AV FISTULA PLACEMENT Left 12/01/2013   Procedure: ARTERIOVENOUS (AV) FISTULA CREATION- LEFT BRACHIOCEPHALIC;  Surgeon: Angelia Mould, MD;  Location: [facility];  Service: Vascular;  Laterality: Left;  . [place] TRANSPOSITION Right 07/27/2014   Procedure: BASCILIC VEIN TRANSPOSITION;  Surgeon: Angelia Mould, MD;  Location: [facility];  Service: Vascular;  Laterality: Right;  . BREAST SURGERY     left - granulomatous mastitis  . COLONOSCOPY    . 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 ENTEROSCOPY N/A 06/03/2018   Procedure: ENTEROSCOPY;  Surgeon: Johnny Bullion, DO;  Location: [facility];  Service: Gastroenterology;  Laterality: N/A;  . EYE SURGERY Bilateral    cataracts  . HEMODIALYSIS INPATIENT  01/17/2018      . HOT HEMOSTASIS N/A 06/03/2018   Procedure: HOT HEMOSTASIS  (ARGON PLASMA COAGULATION/BICAP);  Surgeon: Johnny Bullion, DO;  Location: [facility] ENDOSCOPY;  Service: Gastroenterology;  Laterality: N/A;  . REVISON OF ARTERIOVENOUS FISTULA Left 02/09/2014   Procedure: REVISON OF LEFT ARTERIOVENOUS FISTULA - RESECTION OF RENDUNDANT VEIN;  Surgeon: Angelia Mould, MD;  Location: [facility];  Service: Vascular;  Laterality: Left;  . SBO with lysis adhesions    . SHUNTOGRAM Left 04/19/2014   Procedure: FISTULOGRAM;  Surgeon: Angelia Mould, MD;  Location: [facility] CATH LAB;  Service: Cardiovascular;  Laterality: Left;  . UNILATERAL UPPER EXTREMEITY ANGIOGRAM N/A 07/12/2014   Procedure: UNILATERAL UPPER Anselmo Rod;  Surgeon: Angelia Mould, MD;  Location: [facility] CATH LAB;  Service: Cardiovascular;  Laterality: N/A;    Family History  Problem Relation Age of Onset  . Aneurysm Mother   . Heart disease Father   . Stroke Father   . Hypertension Father   . Diabetes Father   . Dementia Neg Hx   . Colon cancer Neg Hx   . Esophageal cancer Neg Hx   . Pancreatic cancer Neg Hx   . Prostate cancer Neg Hx   . Rectal cancer Neg Hx   . Stomach cancer Neg Hx     Social History:  reports that he quit smoking about 23 years ago. He has never used smokeless tobacco. He reports that  he does not drink alcohol or use drugs.  Allergies:  Allergies  Allergen Reactions  . Penicillins Rash    Has patient had a PCN reaction causing immediate rash, facial/tongue/throat swelling, SOB or lightheadedness with hypotension: Yes Has patient had a PCN reaction causing severe rash involving mucus membranes or skin necrosis: Yes Has patient had a PCN reaction that required hospitalization: No Has patient had a PCN reaction occurring within the last 10 years: No If all of the above answers are "NO", then may proceed with Cephalosporin use.     Medications: meds not reviewed.  Results for orders placed or performed during the hospital encounter of 07/20/18 (from the  past 48 hour(s))  CBC with Differential     Status: Abnormal   Collection Time: 07/21/18  1:29 AM  Result Value Ref Range   WBC 8.6 4.0 - 10.5 K/uL   RBC 3.27 (L) 4.22 - 5.81 MIL/uL   Hemoglobin 10.6 (L) 13.0 - 17.0 g/dL   HCT 32.7 (L) 39.0 - 52.0 %   MCV 100.0 80.0 - 100.0 fL   MCH 32.4 26.0 - 34.0 pg   MCHC 32.4 30.0 - 36.0 g/dL   RDW 16.1 (H) 11.5 - 15.5 %   Platelets 248 150 - 400 K/uL   nRBC 0.2 0.0 - 0.2 %   Neutrophils Relative % 61 %   Neutro Abs 5.3 1.7 - 7.7 K/uL   Lymphocytes Relative 26 %   Lymphs Abs 2.2 0.7 - 4.0 K/uL   Monocytes Relative 10 %   Monocytes Absolute 0.9 0.1 - 1.0 K/uL   Eosinophils Relative 2 %   Eosinophils Absolute 0.2 0.0 - 0.5 K/uL   Basophils Relative 1 %   Basophils Absolute 0.0 0.0 - 0.1 K/uL   Immature Granulocytes 0 %   Abs Immature Granulocytes 0.02 0.00 - 0.07 K/uL    Comment: Performed at [facility], [address], [place], [place] [address]  Comprehensive metabolic panel     Status: Abnormal   Collection Time: 07/21/18  1:29 AM  Result Value Ref Range   Sodium 136 135 - 145 mmol/L   Potassium 3.7 3.5 - 5.1 mmol/L   Chloride 96 (L) 98 - 111 mmol/L   CO2 31 22 - 32 mmol/L   Glucose, Bld 107 (H) 70 - 99 mg/dL   BUN 31 (H) 8 - 23 mg/dL   Creatinine, Ser 7.36 (H) 0.61 - 1.24 mg/dL   Calcium 11.2 (H) 8.9 - 10.3 mg/dL   Total Protein 5.1 (L) 6.5 - 8.1 g/dL   Albumin 2.5 (L) 3.5 - 5.0 g/dL   AST 35 15 - 41 U/L   ALT 13 0 - 44 U/L   Alkaline Phosphatase 84 38 - 126 U/L   Total Bilirubin 0.8 0.3 - 1.2 mg/dL   GFR calc non Af Amer 6 (L) >60 mL/min   GFR calc Af Amer 7 (L) >60 mL/min    Comment: (NOTE) The eGFR has been calculated using the CKD EPI equation. This calculation has not been validated in all clinical situations. eGFR's persistently <60 mL/min signify possible Chronic Kidney Disease.    Anion gap 9 5 - 15    Comment: Performed at [facility] [address] [place], [place] [address]  Type and screen      Status: None   Collection Time: 07/21/18  1:29 AM  Result Value Ref Range   ABO/RH(D) O POS    Antibody Screen NEG  Sample Expiration      07/24/2018 Performed at [facility], [place] [address], [place], [place] [address]   I-stat troponin, ED     Status: None   Collection Time: 07/21/18  1:37 AM  Result Value Ref Range   Troponin i, poc 0.01 0.00 - 0.08 ng/mL   Comment 3            Comment: Due to the release kinetics of cTnI, a negative result within the first hours of the onset of symptoms does not rule out myocardial infarction with certainty. If myocardial infarction is still suspected, repeat the test at appropriate intervals.   I-stat chem 8, ed     Status: Abnormal   Collection Time: 07/21/18  1:51 AM  Result Value Ref Range   Sodium 134 (L) 135 - 145 mmol/L   Potassium 3.6 3.5 - 5.1 mmol/L   Chloride 95 (L) 98 - 111 mmol/L   BUN 37 (H) 8 - 23 mg/dL   Creatinine, Ser 8.10 (H) 0.61 - 1.24 mg/dL   Glucose, Bld 101 (H) 70 - 99 mg/dL   Calcium, Ion 1.33 1.15 - 1.40 mmol/L   TCO2 35 (H) 22 - 32 mmol/L   Hemoglobin 11.9 (L) 13.0 - 17.0 g/dL   HCT 35.0 (L) 39.0 - 52.0 %    Dg Ribs Unilateral W/chest Left  Result Date: 07/20/2018 CLINICAL DATA:  Fall 2 days ago with left rib pain. EXAM: LEFT RIBS AND CHEST - 3+ VIEW COMPARISON:  None. FINDINGS: Lungs are hypoinflated without consolidation, effusion or pneumothorax. Cardiomediastinal silhouette is within normal. Findings suggest subtle fractures of the lateral left tenth rib and anterolateral left ninth rib. Possible lateral left seventh rib fracture. IMPRESSION: No acute cardiopulmonary disease. Possible subtle fractures of the left seventh, ninth and tenth ribs as described. Electronically Signed   By: Marin Olp M.D.   On: 07/20/2018 23:37   Ct Head Wo Contrast  Result Date: 07/20/2018 CLINICAL DATA:  77 y/o M; altered mental status. Unclear cause. Several falls today. EXAM: CT HEAD WITHOUT CONTRAST TECHNIQUE:  Contiguous axial images were obtained from the base of the skull through the vertex without intravenous contrast. COMPARISON:  04/12/2015 MRI head. FINDINGS: Brain: Bilateral subdural hematomas on the right measuring up to 18 mm in thickness on the left up to 15 mm in thickness (series 5, image 37). The subdural hematomas are largely hypointense, however, there is gradient increased attenuation of the right-sided collection and and high attenuation retracted clot in the left-sided collection indicating recent hemorrhage. Mass effect on the brain results in diffuse sulcal effacement. There is no midline shift or downward herniation this time. No findings of stroke, brain parenchymal hemorrhage, or focal mass effect of the brain. Moderate volume loss of the brain is stable from prior MRI of the head given differences in technique. There is a punctate calcification in the right frontal lobe compatible sequelae of prior infectious or inflammatory process. Vascular: Calcific atherosclerosis of the carotid siphons and vertebral arteries. No hyperdense vessel identified. Skull: Normal. Negative for fracture or focal lesion. Sinuses/Orbits: No acute finding. Other: Bilateral intra-ocular lens replacement. IMPRESSION: Large mixed attenuation bilateral subdural hematomas measuring up to 18 mm in thickness on the right and 15 mm on the left, likely acute on chronic. Mass effect on the brain with concave deformation and sulcal effacement. No midline shift or herniation. Critical Value/emergent results were called by telephone at the time of interpretation on 07/20/2018 at 11:55 pm to Dr.  Quincy Carnes , who verbally acknowledged these results. Electronically Signed   By: [name] M.D.   On: 07/20/2018 23:57    Review of Systems  Unable to perform ROS: Mental acuity   Blood pressure (!) 133/117, pulse 81, temperature 98.5 F (36.9 C), temperature source Rectal, resp. rate 18, height _0  (1.803 m), weight  68.9 kg, SpO2 95 %. Physical Exam  Constitutional: He appears well-developed and well-nourished.  HENT:  Head: Normocephalic and atraumatic.  Eyes: Pupils are equal, round, and reactive to light. Conjunctivae and EOM are normal.  Neck: Normal range of motion. Neck supple.  Neurological:  Patient arouses to voice opens both eyes focuses well however has little insight into his situation other than noting that he has had significant headache.  He does realize he is at the hospital but is not oriented to the date.  He wishes to go home.  He does complain of significant headache.  He is able to move his upper extremities well he is able to move his lower extremities well however the patient has not been able to walk nor is he walked at the current time.   Skin: Skin is warm and dry.    Assessment/Plan: Bilateral chronic subdural hematomas I have discussed the duration with the patient's wife at considerable length explaining to her the nature of these bilateral fluid collections.  I explained to her the risks of even simple surgery that is bur holes drain of the fluid I note consult considerable risk with the development of acute subdural collections if the pressure is reduced dramatically.  Nonetheless because of the patient's deteriorating status ultimately he may need some intervention to be done.  He is to have dialysis today and I would like to know what his coagulation status is in preparation for consideration of surgery to do bur hole drainage.  In any event I suspect that this situation will change the patient's living conditions as the wife does not feel that she can provide the degree and level of care that he requires on her own.  I will reevaluate the patient this afternoon after his hemodialysis and also after the patient's wife has had a chance to discuss the situation with her daughter and will plan on making decision regarding surgical intervention at that time.  Blanchie Dessert  Chaney Ingram 07/21/2018, 6:25 AM

## 2018-07-21 NOTE — ED Notes (Signed)
Patient going to dialysis, labs will be drawn there per RN.

## 2018-07-21 NOTE — Progress Notes (Signed)
PT Cancellation Note  Patient Details Name: [name] MRN: [id number] DOB: 03-Feb-1941   Cancelled Treatment:    Reason Eval/Treat Not Completed: Medical issues which prohibited therapy Pt's RN requesting to hold PT secondary to subdural hematomas and reports she is trying to get in touch with MD. Will follow up as pt medically appropriate and as schedule allows.    Leighton Ruff, PT, DPT  Acute Rehabilitation Services  Pager: [phone] Office: [phone]  Rudean Hitt 07/21/2018, 10:51 AM

## 2018-07-21 NOTE — ED Notes (Signed)
RN assisted pt in calling his wife.  He is speaking to her on the phone.

## 2018-07-22 ENCOUNTER — Inpatient Hospital Stay (HOSPITAL_COMMUNITY): Payer: Medicare Other | Admitting: Certified Registered"

## 2018-07-22 ENCOUNTER — Encounter (HOSPITAL_COMMUNITY)
Admission: EM | Disposition: A | Discharge status: Rehabilitation Facility | Payer: Self-pay | Source: Home / Self Care | Attending: Internal Medicine

## 2018-07-22 ENCOUNTER — Encounter (HOSPITAL_COMMUNITY): Payer: Self-pay

## 2018-07-22 ENCOUNTER — Telehealth: Payer: Self-pay | Admitting: Nurse Practitioner

## 2018-07-22 DIAGNOSIS — S065X9A Traumatic subdural hemorrhage with loss of consciousness of unspecified duration, initial encounter: Principal | ICD-10-CM

## 2018-07-22 HISTORY — PX: BURR HOLE: SHX908

## 2018-07-22 HISTORY — PX: BRAIN SURGERY: SHX531

## 2018-07-22 LAB — GLUCOSE, CAPILLARY
Glucose-Capillary: 64 mg/dL — ABNORMAL LOW (ref 70–99)
Glucose-Capillary: 109 mg/dL — ABNORMAL HIGH (ref 70–99)
Glucose-Capillary: 85 mg/dL (ref 70–99)
Glucose-Capillary: 82 mg/dL (ref 70–99)
Glucose-Capillary: 93 mg/dL (ref 70–99)
Glucose-Capillary: 105 mg/dL — ABNORMAL HIGH (ref 70–99)
Glucose-Capillary: 129 mg/dL — ABNORMAL HIGH (ref 70–99)
Glucose-Capillary: 126 mg/dL — ABNORMAL HIGH (ref 70–99)

## 2018-07-22 LAB — POCT I-STAT 4, (NA,K, GLUC, HGB,HCT)
Glucose, Bld: 94 mg/dL (ref 70–99)
HCT: 38 % — ABNORMAL LOW (ref 39.0–52.0)
Hemoglobin: 12.9 g/dL — ABNORMAL LOW (ref 13.0–17.0)
Potassium: 3.5 mmol/L (ref 3.5–5.1)
Sodium: 135 mmol/L (ref 135–145)

## 2018-07-22 LAB — APTT: aPTT: 25 seconds (ref 24–36)

## 2018-07-22 LAB — SURGICAL PCR SCREEN
MRSA, PCR: NEGATIVE
Staphylococcus aureus: NEGATIVE

## 2018-07-22 LAB — PROTIME-INR
INR: 0.97
Prothrombin Time: 12.8 seconds (ref 11.4–15.2)

## 2018-07-22 SURGERY — CREATION, CRANIAL BURR HOLE
Anesthesia: General | Site: Head | Laterality: Bilateral

## 2018-07-22 MED ORDER — DEXAMETHASONE SODIUM PHOSPHATE 10 MG/ML IJ SOLN
INTRAMUSCULAR | Status: DC | PRN
  Administered 2018-07-22: 5 mg via INTRAVENOUS

## 2018-07-22 MED ORDER — PHENYLEPHRINE 40 MCG/ML (10ML) SYRINGE FOR IV PUSH (FOR BLOOD PRESSURE SUPPORT)
PREFILLED_SYRINGE | INTRAVENOUS | Status: AC
  Filled 2018-07-22: qty 10

## 2018-07-22 MED ORDER — BACITRACIN ZINC 500 UNIT/GM EX OINT
TOPICAL_OINTMENT | CUTANEOUS | Status: DC | PRN
  Administered 2018-07-22: 1 via TOPICAL

## 2018-07-22 MED ORDER — ONDANSETRON HCL 4 MG PO TABS: 4.0000 mg | ORAL_TABLET | ORAL | Status: DC | PRN

## 2018-07-22 MED ORDER — CISATRACURIUM BESYLATE (PF) 10 MG/5ML IV SOLN
INTRAVENOUS | Status: DC | PRN
  Administered 2018-07-22: 10 mg via INTRAVENOUS

## 2018-07-22 MED ORDER — BACITRACIN ZINC 500 UNIT/GM EX OINT
TOPICAL_OINTMENT | CUTANEOUS | Status: AC
  Filled 2018-07-22: qty 28.35

## 2018-07-22 MED ORDER — ESMOLOL HCL 100 MG/10ML IV SOLN
INTRAVENOUS | Status: AC
  Filled 2018-07-22: qty 10

## 2018-07-22 MED ORDER — THROMBIN 20000 UNITS EX SOLR
CUTANEOUS | Status: DC | PRN
  Administered 2018-07-22: 18:00:00 via TOPICAL

## 2018-07-22 MED ORDER — SENNA 8.6 MG PO TABS
1.0000 | ORAL_TABLET | Freq: Two times a day (BID) | ORAL | Status: DC
  Administered 2018-07-23 – 2018-07-29 (×9): 8.6 mg via ORAL
  Filled 2018-07-22 (×8): qty 1

## 2018-07-22 MED ORDER — VANCOMYCIN HCL IN DEXTROSE 1-5 GM/200ML-% IV SOLN
INTRAVENOUS | Status: AC
  Filled 2018-07-22: qty 200

## 2018-07-22 MED ORDER — WHITE PETROLATUM EX OINT
TOPICAL_OINTMENT | CUTANEOUS | Status: AC
  Filled 2018-07-22: qty 28.35

## 2018-07-22 MED ORDER — FENTANYL CITRATE (PF) 250 MCG/5ML IJ SOLN
INTRAMUSCULAR | Status: AC
  Filled 2018-07-22: qty 5

## 2018-07-22 MED ORDER — BUPIVACAINE HCL (PF) 0.25 % IJ SOLN
INTRAMUSCULAR | Status: AC
  Filled 2018-07-22: qty 30

## 2018-07-22 MED ORDER — 0.9 % SODIUM CHLORIDE (POUR BTL) OPTIME
TOPICAL | Status: DC | PRN
  Administered 2018-07-22 (×2): 1000 mL

## 2018-07-22 MED ORDER — ONDANSETRON HCL 4 MG/2ML IJ SOLN: 4.0000 mg | INTRAMUSCULAR | Status: DC | PRN

## 2018-07-22 MED ORDER — POLYETHYLENE GLYCOL 3350 17 G PO PACK: 17.0000 g | PACK | Freq: Every day | ORAL | Status: DC | PRN

## 2018-07-22 MED ORDER — GLYCOPYRROLATE 0.2 MG/ML IJ SOLN
INTRAMUSCULAR | Status: DC | PRN
  Administered 2018-07-22: .4 mg via INTRAVENOUS

## 2018-07-22 MED ORDER — FLEET ENEMA 7-19 GM/118ML RE ENEM: 1.0000 | ENEMA | Freq: Once | RECTAL | Status: DC | PRN

## 2018-07-22 MED ORDER — THROMBIN (RECOMBINANT) 20000 UNITS EX SOLR
CUTANEOUS | Status: AC
  Filled 2018-07-22: qty 20000

## 2018-07-22 MED ORDER — LIDOCAINE-EPINEPHRINE 1 %-1:100000 IJ SOLN
INTRAMUSCULAR | Status: DC | PRN
  Administered 2018-07-22: 3.5 mL

## 2018-07-22 MED ORDER — FENTANYL CITRATE (PF) 100 MCG/2ML IJ SOLN
INTRAMUSCULAR | Status: DC | PRN
  Administered 2018-07-22: 25 ug via INTRAVENOUS
  Administered 2018-07-22: 50 ug via INTRAVENOUS
  Administered 2018-07-22: 25 ug via INTRAVENOUS
  Administered 2018-07-22: 50 ug via INTRAVENOUS

## 2018-07-22 MED ORDER — ONDANSETRON HCL 4 MG/2ML IJ SOLN
INTRAMUSCULAR | Status: AC
  Filled 2018-07-22: qty 2

## 2018-07-22 MED ORDER — PROPOFOL 10 MG/ML IV BOLUS
INTRAVENOUS | Status: AC
  Filled 2018-07-22: qty 20

## 2018-07-22 MED ORDER — SODIUM CHLORIDE 0.9 % IV SOLN
INTRAVENOUS | Status: DC
  Administered 2018-07-22 – 2018-07-24 (×3): via INTRAVENOUS

## 2018-07-22 MED ORDER — SODIUM CHLORIDE 0.9 % IV SOLN
INTRAVENOUS | Status: DC | PRN
  Administered 2018-07-22: 20 ug/min via INTRAVENOUS

## 2018-07-22 MED ORDER — NEOSTIGMINE METHYLSULFATE 10 MG/10ML IV SOLN
INTRAVENOUS | Status: DC | PRN
  Administered 2018-07-22: 3 mg via INTRAVENOUS

## 2018-07-22 MED ORDER — GLYCOPYRROLATE PF 0.2 MG/ML IJ SOSY
PREFILLED_SYRINGE | INTRAMUSCULAR | Status: AC
  Filled 2018-07-22: qty 1

## 2018-07-22 MED ORDER — NEOSTIGMINE METHYLSULFATE 3 MG/3ML IV SOSY
PREFILLED_SYRINGE | INTRAVENOUS | Status: AC
  Filled 2018-07-22: qty 3

## 2018-07-22 MED ORDER — ALBUMIN HUMAN 5 % IV SOLN
INTRAVENOUS | Status: DC | PRN
  Administered 2018-07-22 (×2): via INTRAVENOUS

## 2018-07-22 MED ORDER — LABETALOL HCL 5 MG/ML IV SOLN
10.0000 mg | INTRAVENOUS | Status: DC | PRN
  Administered 2018-07-22 – 2018-07-26 (×2): 20 mg via INTRAVENOUS
  Administered 2018-07-27 (×2): 10 mg via INTRAVENOUS
  Filled 2018-07-22 (×4): qty 4

## 2018-07-22 MED ORDER — LIDOCAINE-EPINEPHRINE 1 %-1:100000 IJ SOLN
INTRAMUSCULAR | Status: AC
  Filled 2018-07-22: qty 1

## 2018-07-22 MED ORDER — PROMETHAZINE HCL 25 MG PO TABS
12.5000 mg | ORAL_TABLET | ORAL | Status: DC | PRN
  Administered 2018-07-28: 25 mg via ORAL
  Filled 2018-07-22: qty 1

## 2018-07-22 MED ORDER — PROPOFOL 10 MG/ML IV BOLUS
INTRAVENOUS | Status: DC | PRN
  Administered 2018-07-22: 75 mg via INTRAVENOUS
  Administered 2018-07-22: 50 mg via INTRAVENOUS

## 2018-07-22 MED ORDER — LEVETIRACETAM IN NACL 500 MG/100ML IV SOLN
500.0000 mg | Freq: Two times a day (BID) | INTRAVENOUS | Status: DC
  Administered 2018-07-22 – 2018-07-28 (×12): 500 mg via INTRAVENOUS
  Filled 2018-07-22 (×13): qty 100

## 2018-07-22 MED ORDER — DOCUSATE SODIUM 100 MG PO CAPS
100.0000 mg | ORAL_CAPSULE | Freq: Two times a day (BID) | ORAL | Status: DC
  Administered 2018-07-23 – 2018-07-29 (×10): 100 mg via ORAL
  Filled 2018-07-22 (×10): qty 1

## 2018-07-22 MED ORDER — VANCOMYCIN HCL 1000 MG IV SOLR
INTRAVENOUS | Status: DC | PRN
  Administered 2018-07-22: 1000 mg via INTRAVENOUS

## 2018-07-22 MED ORDER — CHLORHEXIDINE GLUCONATE CLOTH 2 % EX PADS
6.0000 | MEDICATED_PAD | Freq: Every day | CUTANEOUS | Status: DC
  Administered 2018-07-22: 6 via TOPICAL

## 2018-07-22 MED ORDER — VASOPRESSIN 20 UNIT/ML IV SOLN
INTRAVENOUS | Status: AC
  Filled 2018-07-22: qty 1

## 2018-07-22 MED ORDER — BISACODYL 10 MG RE SUPP: 10.0000 mg | Freq: Every day | RECTAL | Status: DC | PRN

## 2018-07-22 MED ORDER — ESMOLOL HCL 100 MG/10ML IV SOLN
INTRAVENOUS | Status: DC | PRN
  Administered 2018-07-22 (×2): 30 mg via INTRAVENOUS

## 2018-07-22 MED ORDER — ROCURONIUM BROMIDE 50 MG/5ML IV SOSY
PREFILLED_SYRINGE | INTRAVENOUS | Status: AC
  Filled 2018-07-22: qty 5

## 2018-07-22 MED ORDER — BUPIVACAINE HCL (PF) 0.25 % IJ SOLN
INTRAMUSCULAR | Status: DC | PRN
  Administered 2018-07-22: 3.5 mL

## 2018-07-22 MED ORDER — CISATRACURIUM BESYLATE 20 MG/10ML IV SOLN
INTRAVENOUS | Status: AC
  Filled 2018-07-22: qty 10

## 2018-07-22 SURGICAL SUPPLY — 64 items
BAG DECANTER FOR FLEXI CONT (MISCELLANEOUS) ×3 IMPLANT
BNDG GAUZE ELAST 4 BULKY (GAUZE/BANDAGES/DRESSINGS) ×1 IMPLANT
BUR ACORN 6.0 (BURR) ×2 IMPLANT
BUR ACORN 6.0MM (BURR) ×1
BUR SPIRAL ROUTER 2.3 (BUR) ×1 IMPLANT
BUR SPIRAL ROUTER 2.3MM (BUR) ×1
CANISTER SUCT 3000ML PPV (MISCELLANEOUS) ×3 IMPLANT
CARTRIDGE OIL MAESTRO DRILL (MISCELLANEOUS) ×1 IMPLANT
CATH VENTRIC 35X38 W/TROCAR LG (CATHETERS) ×4 IMPLANT
CLIP VESOCCLUDE MED 6/CT (CLIP) IMPLANT
COVER WAND RF STERILE (DRAPES) ×3 IMPLANT
DIFFUSER DRILL AIR PNEUMATIC (MISCELLANEOUS) ×3 IMPLANT
DRAIN CHANNEL 10M FLAT 3/4 FLT (DRAIN) IMPLANT
DRAIN PENROSE 1/2X12 LTX STRL (WOUND CARE) IMPLANT
DRAPE WARM FLUID 44X44 (DRAPE) ×3 IMPLANT
DRSG ADAPTIC 3X8 NADH LF (GAUZE/BANDAGES/DRESSINGS) IMPLANT
DRSG OPSITE 4X5.5 SM (GAUZE/BANDAGES/DRESSINGS) ×4 IMPLANT
DRSG PAD ABDOMINAL 8X10 ST (GAUZE/BANDAGES/DRESSINGS) IMPLANT
DRSG TELFA 3X8 NADH (GAUZE/BANDAGES/DRESSINGS) ×3 IMPLANT
DURAPREP 6ML APPLICATOR 50/CS (WOUND CARE) ×3 IMPLANT
ELECT CAUTERY BLADE 6.4 (BLADE) ×1 IMPLANT
ELECT REM PT RETURN 9FT ADLT (ELECTROSURGICAL) ×3
ELECTRODE REM PT RTRN 9FT ADLT (ELECTROSURGICAL) ×1 IMPLANT
EVACUATOR SILICONE 100CC (DRAIN) IMPLANT
GAUZE 4X4 16PLY RFD (DISPOSABLE) IMPLANT
GAUZE SPONGE 4X4 12PLY STRL (GAUZE/BANDAGES/DRESSINGS) IMPLANT
GLOVE BIOGEL PI IND STRL 8.5 (GLOVE) ×1 IMPLANT
GLOVE BIOGEL PI INDICATOR 8.5 (GLOVE) ×2
GLOVE ECLIPSE 8.5 STRL (GLOVE) ×3 IMPLANT
GLOVE EXAM NITRILE LRG STRL (GLOVE) IMPLANT
GLOVE EXAM NITRILE XL STR (GLOVE) IMPLANT
GLOVE EXAM NITRILE XS STR PU (GLOVE) IMPLANT
GOWN STRL REUS W/ TWL LRG LVL3 (GOWN DISPOSABLE) IMPLANT
GOWN STRL REUS W/ TWL XL LVL3 (GOWN DISPOSABLE) ×1 IMPLANT
GOWN STRL REUS W/TWL 2XL LVL3 (GOWN DISPOSABLE) ×3 IMPLANT
GOWN STRL REUS W/TWL LRG LVL3 (GOWN DISPOSABLE)
GOWN STRL REUS W/TWL XL LVL3 (GOWN DISPOSABLE) ×3
HEMOSTAT SURGICEL 2X14 (HEMOSTASIS) IMPLANT
HOOK DURA (MISCELLANEOUS) ×3 IMPLANT
KIT BASIN OR (CUSTOM PROCEDURE TRAY) ×3 IMPLANT
KIT TURNOVER KIT B (KITS) ×3 IMPLANT
NEEDLE HYPO 22GX1.5 SAFETY (NEEDLE) ×3 IMPLANT
NS IRRIG 1000ML POUR BTL (IV SOLUTION) ×3 IMPLANT
OIL CARTRIDGE MAESTRO DRILL (MISCELLANEOUS) ×3
PACK CRANIOTOMY CUSTOM (CUSTOM PROCEDURE TRAY) ×3 IMPLANT
PAD DRESSING TELFA 3X8 NADH (GAUZE/BANDAGES/DRESSINGS) IMPLANT
PATTIES SURGICAL .5 X.5 (GAUZE/BANDAGES/DRESSINGS) IMPLANT
PATTIES SURGICAL .5 X3 (DISPOSABLE) IMPLANT
PATTIES SURGICAL 1X1 (DISPOSABLE) IMPLANT
PIN MAYFIELD SKULL DISP (PIN) IMPLANT
SPECIMEN JAR SMALL (MISCELLANEOUS) IMPLANT
SPONGE NEURO XRAY DETECT 1X3 (DISPOSABLE) IMPLANT
SPONGE SURGIFOAM ABS GEL 100 (HEMOSTASIS) IMPLANT
STAPLER SKIN PROX WIDE 3.9 (STAPLE) ×3 IMPLANT
SUT ETHILON 3 0 FSL (SUTURE) IMPLANT
SUT NURALON 4 0 TR CR/8 (SUTURE) ×6 IMPLANT
SUT VIC AB 2-0 CP2 18 (SUTURE) ×6 IMPLANT
SYS ACCUDRAIN CSF W/REFLUX VAL (SYSTAGENIX WOUND MANAGEMENT) ×6
SYSTEM ACCDRN CSF W/REFLUX VAL (SYSTAGENIX WOUND MANAGEMENT) IMPLANT
TOWEL GREEN STERILE (TOWEL DISPOSABLE) ×3 IMPLANT
TOWEL GREEN STERILE FF (TOWEL DISPOSABLE) ×3 IMPLANT
TRAY FOLEY MTR SLVR 16FR STAT (SET/KITS/TRAYS/PACK) ×2 IMPLANT
UNDERPAD 30X30 (UNDERPADS AND DIAPERS) IMPLANT
WATER STERILE IRR 1000ML POUR (IV SOLUTION) ×3 IMPLANT

## 2018-07-22 NOTE — Progress Notes (Signed)
SLP Cancellation Note  Patient Details Name: [name] MRN: [id number] DOB: 05-17-1941   Cancelled treatment:       Reason Eval/Treat Not Completed: Medical issues which prohibited therapy. Orders received for swallow evaluation but per RN, pt is to be NPO as tentative plan is for surgery this afternoon. Will f/u for swallow evaluation as able.   Also note per chart review that pt has been having cognitive decline. May wish to consider ordering SLP cognitive-linguistic evaluation as well.   Germain Osgood 07/22/2018, 9:58 AM  Germain Osgood, M.A. [place] Acute Environmental education officer [phone] Office [phone]

## 2018-07-22 NOTE — Progress Notes (Signed)
Patient ID: [name], male   DOB: 10-Sep-1941, 77 y.o.   MRN: [id number] Signs are stable Patient is confused and confabulating as he was yesterday Continues to move all 4 extremities well in bed He has not been ambulating He is n.p.o.  I discussed with his wife that his coagulation studies are within normal limits of normal and now would be a good time to do the surgery She will be here to sign a consent in the next hour.

## 2018-07-22 NOTE — Anesthesia Preprocedure Evaluation (Addendum)
Anesthesia Evaluation  Patient identified by MRN, date of birth, ID band Patient awake    Reviewed: Allergy & Precautions, H&P , NPO status , Patient's Chart, lab work & pertinent test results  Airway Mallampati: III  TM Distance: >3 FB Neck ROM: Full    Dental no notable dental hx. (+) Edentulous Upper, Dental Advisory Given   Pulmonary sleep apnea , former smoker,    Pulmonary exam normal breath sounds clear to auscultation       Cardiovascular hypertension, + Peripheral Vascular Disease   Rhythm:Regular Rate:Normal     Neuro/Psych negative neurological ROS  negative psych ROS   GI/Hepatic Neg liver ROS, GERD  Medicated and Controlled,  Endo/Other  diabetes, Insulin DependentHypothyroidism   Renal/GU ESRF and DialysisRenal disease  negative genitourinary   Musculoskeletal  (+) Arthritis , Osteoarthritis,    Abdominal   Peds  Hematology  (+) Blood dyscrasia, anemia ,   Anesthesia Other Findings   Reproductive/Obstetrics negative OB ROS                            Anesthesia Physical Anesthesia Plan  ASA: III  Anesthesia Plan: General   Post-op Pain Management:    Induction: Intravenous  PONV Risk Score and Plan: 3 and Ondansetron and Treatment may vary due to age or medical condition  Airway Management Planned: Oral ETT  Additional Equipment:   Intra-op Plan:   Post-operative Plan: Extubation in OR  Informed Consent: I have reviewed the patients History and Physical, chart, labs and discussed the procedure including the risks, benefits and alternatives for the proposed anesthesia with the patient or authorized representative who has indicated his/her understanding and acceptance.   Dental advisory given  Plan Discussed with: CRNA  Anesthesia Plan Comments:         Anesthesia Quick Evaluation

## 2018-07-22 NOTE — Anesthesia Postprocedure Evaluation (Signed)
Anesthesia Post Note  Patient: [name]  Procedure(s) Performed: BILATERAL BURR HOLES (Bilateral Head)     Patient location during evaluation: PACU Anesthesia Type: General Level of consciousness: sedated Pain management: pain level controlled Vital Signs Assessment: post-procedure vital signs reviewed and stable Respiratory status: spontaneous breathing, nonlabored ventilation, respiratory function stable and patient connected to nasal cannula oxygen Cardiovascular status: blood pressure returned to baseline and stable Postop Assessment: no apparent nausea or vomiting Anesthetic complications: no    Last Vitals:  Vitals:   07/22/18 2015 07/22/18 2045  BP: (!) 135/99   Pulse: 91   Resp: 12   Temp:  37 C  SpO2: 96%     Last Pain:  Vitals:   07/22/18 2045  TempSrc: Axillary  PainSc:                  Hera Celaya,E. Shaelin Lalley

## 2018-07-22 NOTE — Evaluation (Signed)
Physical Therapy Evaluation Patient Details Name: [name] MRN: [id number] DOB: 1941-03-21 Today's Date: 07/22/2018   History of Present Illness  77 y.o. male with medical history significant for type 2 diabetes mellitus, hypothyroidism, dementia, and end-stage renal disease on hemodialysis, presented with increased confusion and history of falls at home.   Imaging revealed SDH with modest mass-effect causing concave deformity, multi rib fx on left.  Clinical Impression  Orders received for PT evaluation. Patient demonstrates deficits in functional mobility as indicated below. Will benefit from continued skilled PT to address deficits and maximize function. Will see as indicated and progress as tolerated.  At this time, session limited to in room activity due to patients distraction level and decreased safety awareness. Given cognitive and physical deficits feel patient will likely need SNF upon acute discharge.    Follow Up Recommendations SNF;Supervision/Assistance - 24 hour    Equipment Recommendations  (tbd)    Recommendations for Other Services       Precautions / Restrictions Precautions Precautions: Fall      Mobility  Bed Mobility Overal bed mobility: Needs Assistance Bed Mobility: Supine to Sit;Sit to Supine     Supine to sit: Mod assist Sit to supine: Min assist   General bed mobility comments: Moderate assist to rotate hips and elevate trunk to upright at EOB, min assist to return to supine  Transfers Overall transfer level: Needs assistance Equipment used: Rolling walker (2 wheeled) Transfers: Sit to/from Stand Sit to Stand: Min assist         General transfer comment: min assist to power up to standing with manual hand placement on RW, continued assist to maintain stability in upright, posterior list noted  Ambulation/Gait Ambulation/Gait assistance: Mod assist Gait Distance (Feet): 24 Feet Assistive device: Rolling walker (2 wheeled) Gait  Pattern/deviations: Step-to pattern;Decreased stride length;Leaning posteriorly;Narrow base of support;Drifts right/left Gait velocity: decreased Gait velocity interpretation: <1.31 ft/sec, indicative of household ambulator General Gait Details: patient with poorly coordinated gait, posterior list and noted enviromental awareness deficits. Moderate assist to maintain stability and navigate in room  Stairs            Wheelchair Mobility    Modified Rankin (Stroke Patients Only) Modified Rankin (Stroke Patients Only) Pre-Morbid Rankin Score: No symptoms Modified Rankin: Moderately severe disability     Balance Overall balance assessment: History of Falls;Needs assistance Sitting-balance support: Feet supported Sitting balance-Leahy Scale: Poor   Postural control: Posterior lean Standing balance support: Bilateral upper extremity supported;During functional activity Standing balance-Leahy Scale: Poor                               Pertinent Vitals/Pain Pain Assessment: Faces Faces Pain Scale: Hurts little more Pain Location: generalized with movement Pain Intervention(s): Monitored during session    Home Living Family/patient expects to be discharged to:: Private residence Living Arrangements: Spouse/significant other   Type of Home: [place]: Two level   Additional Comments: history taken from chart review, patient only alert/ oriented to self so unreliable historian at this time    Prior Function           Comments: unclear     Hand Dominance   Dominant Hand: Right    Extremity/Trunk Assessment   Upper Extremity Assessment Upper Extremity Assessment: Defer to OT evaluation    Lower Extremity Assessment Lower Extremity Assessment: Generalized weakness;Difficult to assess due to impaired cognition(moves  both LEs through full range)    Cervical / Trunk Assessment Cervical / Trunk Assessment: Kyphotic  Communication    Communication: Expressive difficulties  Cognition Arousal/Alertness: Awake/alert Behavior During Therapy: Restless Overall Cognitive Status: Impaired/Different from baseline Area of Impairment: Orientation;Attention;Memory;Following commands;Safety/judgement;Awareness;Problem solving                 Orientation Level: Disoriented to;Place;Time;Situation Current Attention Level: Focused Memory: Decreased recall of precautions;Decreased short-term memory Following Commands: Follows one step commands inconsistently;Follows one step commands with increased time Safety/Judgement: Decreased awareness of safety;Decreased awareness of deficits Awareness: Intellectual Problem Solving: Slow processing;Decreased initiation;Difficulty sequencing;Requires verbal cues;Requires tactile cues General Comments: patient extremely confused throughout session, asking for "his pants in the kitchen"      General Comments      Exercises     Assessment/Plan    PT Assessment Patient needs continued PT services  PT Problem List Decreased strength;Decreased activity tolerance;Decreased balance;Decreased mobility;Decreased coordination;Decreased cognition;Decreased safety awareness;Impaired sensation       PT Treatment Interventions DME instruction;Gait training;Functional mobility training;Therapeutic activities;Therapeutic exercise;Balance training;Neuromuscular re-education;Cognitive remediation;Patient/family education    PT Goals (Current goals can be found in the Care Plan section)  Acute Rehab PT Goals Patient Stated Goal: none stated PT Goal Formulation: Patient unable to participate in goal setting Time For Goal Achievement: 08/05/18 Potential to Achieve Goals: Fair    Frequency Min 2X/week   Barriers to discharge Decreased caregiver support      Co-evaluation               AM-PAC PT "6 Clicks" Daily Activity  Outcome Measure Difficulty turning over in bed (including  adjusting bedclothes, sheets and blankets)?: Unable Difficulty moving from lying on back to sitting on the side of the bed? : Unable Difficulty sitting down on and standing up from a chair with arms (e.g., wheelchair, bedside commode, etc,.)?: Unable Help needed moving to and from a bed to chair (including a wheelchair)?: A Lot Help needed walking in hospital room?: A Lot Help needed climbing 3-5 steps with a railing? : Total 6 Click Score: 8    End of Session Equipment Utilized During Treatment: Gait belt Activity Tolerance: Patient tolerated treatment well;Other (comment)(restless) Patient left: in bed;with call bell/phone within reach;with bed alarm set Nurse Communication: Mobility status PT Visit Diagnosis: Unsteadiness on feet (R26.81);Muscle weakness (generalized) (M62.81);Ataxic gait (R26.0);Repeated falls (R29.6)    Time: 1124-1140 PT Time Calculation (min) (ACUTE ONLY): 16 min   Charges:   PT Evaluation $PT Eval Moderate Complexity: 1 Mod          Alben Deeds, PT DPT  Board Certified Neurologic Specialist Acute Rehabilitation Services Pager [phone] Office [phone]   Duncan Dull 07/22/2018, 1:38 PM

## 2018-07-22 NOTE — Progress Notes (Signed)
PROGRESS NOTE    [name]  XHB:[id number] DOB: [birth year] DOA: 07/20/2018 PCP: Elby Showers, MD   Brief Narrative: [name] a 76 y.o.malewith medical history significant fortype 2 diabetes mellitus, hypothyroidism, dementia, and end-stage renal disease on hemodialysis, now presenting to the emergency department with increased confusion. Patient was admitted last month with GI bleeding and generalized weakness, was transfused, but has continued to experience generalized weakness and his wife has noted some difficulty with gait and reports that he has fallen multiple times over the last few months. He frequently becomes confused at night, but family was concerned that he seemed to be more confused than usual throughout the day, prompting his presentation to the ED.   Assessment & Plan:   Principal Problem:   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HCC) Active Problems:   Insulin dependent diabetes mellitus (HCC)   Hypothyroidism   OSA (obstructive sleep apnea)   ESRD on dialysis (HCC)   Mild cognitive impairment with memory loss   Multiple fractures of ribs, left side, initial encounter for closed fracture   Subdural hematoma (HCC)   Acute on chronic bilateral subdural hematoma Further management as per neurosurgery.  Patient appears to have acute on chronic with modest mass-effect causing concave deformity.   Multiple rib fractures Pain control incentive spirometry.     End-stage renal disease on dialysis Monday Wednesday Friday. Further management as per nephrology   Dementia patient on Aricept and Namenda at home.   Hypothyroidism continue Synthroid when able to take by mouth.   Type 2 diabetes mellitus Hemoglobin A1c 5.2 Continue with sliding scale insulin.  Anemia of chronic disease  hemoglobin at 10.9 Monitor.    DVT prophylaxis: SCDs Code Status: Full code Family Communication: None at bedside Disposition Plan: Pending clinical  improvement   Consultants:   Neurosurgery  Nephrology   Procedures: (None   Antimicrobials: None   Subjective: Patient reports slight headache he denies any nausea vomiting  Objective: Vitals:   07/22/18 0355 07/22/18 0743 07/22/18 0817 07/22/18 1104  BP: (!) 154/94 (!) 187/111 (!) 141/99 (!) 154/88  Pulse: 77 85  75  Resp: 20 20  16   Temp: 98 F (36.7 C) 97.9 F (36.6 C)  97.6 F (36.4 C)  TempSrc: Oral Oral  Oral  SpO2:  100%  100%  Weight:      Height:        Intake/Output Summary (Last 24 hours) at 07/22/2018 1259 Last data filed at 07/22/2018 0308 Gross per 24 hour  Intake 36.02 ml  Output 1300 ml  Net -1263.98 ml   Filed Weights   07/20/18 2220 07/21/18 1330  Weight: 68.9 kg 69 kg    Examination:  General exam: Appears calm and comfortable  Respiratory system: Clear to auscultation. Respiratory effort normal. Cardiovascular system: S1 & S2 heard, RRR. Marland Kitchen No pedal edema. Gastrointestinal system: Abdomen is nondistended, soft and nontender. No organomegaly or masses felt. Normal bowel sounds heard. Central nervous system: Alert answering simple questions, able to move all extremities Extremities: Cyanosis or clubbing Skin: No rashes, lesions or ulcers Psychiatry: No agitation    Data Reviewed: I have personally reviewed following labs and imaging studies  CBC: Recent Labs  Lab 07/21/18 0129 07/21/18 0151 07/21/18 1026  WBC 8.6  --  7.7  NEUTROABS 5.3  --   --   HGB 10.6* 11.9* 10.9*  HCT 32.7* 35.0* 34.7*  MCV 100.0  --  100.0  PLT 248  --  264  Basic Metabolic Panel: Recent Labs  Lab 07/21/18 0129 07/21/18 0151 07/21/18 1026  NA 136 134* 138  K 3.7 3.6 3.8  CL 96* 95* 94*  CO2 31  --  29  GLUCOSE 107* 101* 95  BUN 31* 37* 34*  CREATININE 7.36* 8.10* 8.07*  CALCIUM 11.2*  --  11.1*  PHOS  --   --  6.4*   GFR: Estimated Creatinine Clearance: 7.6 mL/min (A) (by C-G formula based on SCr of 8.07 mg/dL (H)). Liver Function  Tests: Recent Labs  Lab 07/21/18 0129 07/21/18 1026  AST 35  --   ALT 13  --   ALKPHOS 84  --   BILITOT 0.8  --   PROT 5.1*  --   ALBUMIN 2.5* 2.5*   No results for input(s): LIPASE, AMYLASE in the last 168 hours. No results for input(s): AMMONIA in the last 168 hours. Coagulation Profile: Recent Labs  Lab 07/22/18 0612  INR 0.97   Cardiac Enzymes: No results for input(s): CKTOTAL, CKMB, CKMBINDEX, TROPONINI in the last 168 hours. BNP (last 3 results) No results for input(s): PROBNP in the last 8760 hours. HbA1C: No results for input(s): HGBA1C in the last 72 hours. CBG: Recent Labs  Lab 07/21/18 2124 07/21/18 2158 07/22/18 0106 07/22/18 0608 07/22/18 1143  GLUCAP 64* 109* 85 82 93   Lipid Profile: No results for input(s): CHOL, HDL, LDLCALC, TRIG, CHOLHDL, LDLDIRECT in the last 72 hours. Thyroid Function Tests: No results for input(s): TSH, T4TOTAL, FREET4, T3FREE, THYROIDAB in the last 72 hours. Anemia Panel: No results for input(s): VITAMINB12, FOLATE, FERRITIN, TIBC, IRON, RETICCTPCT in the last 72 hours. Sepsis Labs: No results for input(s): PROCALCITON, LATICACIDVEN in the last 168 hours.  Recent Results (from the past 240 hour(s))  Surgical PCR screen     Status: None   Collection Time: 07/22/18 12:00 AM  Result Value Ref Range Status   MRSA, PCR NEGATIVE NEGATIVE Final   Staphylococcus aureus NEGATIVE NEGATIVE Final    Comment: (NOTE) The Xpert SA Assay (FDA approved for NASAL specimens in patients 11 years of age and older), is one component of a comprehensive surveillance program. It is not intended to diagnose infection nor to guide or monitor treatment. Performed at [facility], [place] [address] [place], [place] [address]          Radiology Studies: Dg Ribs Unilateral W/chest Left  Result Date: 07/20/2018 CLINICAL DATA:  Fall 2 days ago with left rib pain. EXAM: LEFT RIBS AND CHEST - 3+ VIEW COMPARISON:  None. FINDINGS: Lungs are  hypoinflated without consolidation, effusion or pneumothorax. Cardiomediastinal silhouette is within normal. Findings suggest subtle fractures of the lateral left tenth rib and anterolateral left ninth rib. Possible lateral left seventh rib fracture. IMPRESSION: No acute cardiopulmonary disease. Possible subtle fractures of the left seventh, ninth and tenth ribs as described. Electronically Signed   By: Marin Olp M.D.   On: 07/20/2018 23:37   Ct Head Wo Contrast  Result Date: 07/20/2018 CLINICAL DATA:  77 y/o M; altered mental status. Unclear cause. Several falls today. EXAM: CT HEAD WITHOUT CONTRAST TECHNIQUE: Contiguous axial images were obtained from the base of the skull through the vertex without intravenous contrast. COMPARISON:  04/12/2015 MRI head. FINDINGS: Brain: Bilateral subdural hematomas on the right measuring up to 18 mm in thickness on the left up to 15 mm in thickness (series 5, image 37). The subdural hematomas are largely hypointense, however, there is gradient increased attenuation of the  right-sided collection and and high attenuation retracted clot in the left-sided collection indicating recent hemorrhage. Mass effect on the brain results in diffuse sulcal effacement. There is no midline shift or downward herniation this time. No findings of stroke, brain parenchymal hemorrhage, or focal mass effect of the brain. Moderate volume loss of the brain is stable from prior MRI of the head given differences in technique. There is a punctate calcification in the right frontal lobe compatible sequelae of prior infectious or inflammatory process. Vascular: Calcific atherosclerosis of the carotid siphons and vertebral arteries. No hyperdense vessel identified. Skull: Normal. Negative for fracture or focal lesion. Sinuses/Orbits: No acute finding. Other: Bilateral intra-ocular lens replacement. IMPRESSION: Large mixed attenuation bilateral subdural hematomas measuring up to 18 mm in thickness on  the right and 15 mm on the left, likely acute on chronic. Mass effect on the brain with concave deformation and sulcal effacement. No midline shift or herniation. Critical Value/emergent results were called by telephone at the time of interpretation on 07/20/2018 at 11:55 pm to Dr. Quincy Carnes , who verbally acknowledged these results. Electronically Signed   By: [name] M.D.   On: 07/20/2018 23:57        Scheduled Meds: . Chlorhexidine Gluconate Cloth  6 each Topical Q0600  . Chlorhexidine Gluconate Cloth  6 each Topical Q0600  . donepezil  10 mg Oral QHS  . insulin aspart  0-5 Units Subcutaneous QHS  . insulin aspart  0-9 Units Subcutaneous TID WC  . levothyroxine  50 mcg Oral QAC breakfast  . memantine  10 mg Oral BID  . mupirocin ointment  1 application Nasal BID  . pantoprazole  40 mg Oral Daily  . polyethylene glycol  17 g Oral Daily  . sevelamer carbonate  800 mg Oral Q supper  . simvastatin  20 mg Oral QHS  . sodium chloride flush  3 mL Intravenous Q12H  . sodium chloride flush  3 mL Intravenous Q12H  . vitamin B-12  1,000 mcg Oral Daily  . white petrolatum       Continuous Infusions: . sodium chloride    . dextrose 25 mL/hr at 07/22/18 0308     LOS: 1 day    Time spent: 28 minutes    Hosie Poisson, MD [facility] Pager [phone]  If 7PM-7AM, please contact night-coverage [email or web address] Password [email or web address] 07/22/2018, 12:59 PM

## 2018-07-22 NOTE — Progress Notes (Signed)
Patient off unit for procedure.

## 2018-07-22 NOTE — Anesthesia Procedure Notes (Signed)
Procedure Name: Intubation Date/Time: 07/22/2018 4:48 PM Performed by: White, Amedeo Plenty, CRNA Pre-anesthesia Checklist: Patient identified, Emergency Drugs available, Suction available and Patient being monitored Patient Re-evaluated:Patient Re-evaluated prior to induction Oxygen Delivery Method: Circle System Utilized Preoxygenation: Pre-oxygenation with 100% oxygen Induction Type: IV induction Ventilation: Mask ventilation without difficulty Laryngoscope Size: Mac and 4 Grade View: Grade I Tube type: Oral Tube size: 7.5 mm Number of attempts: 1 Airway Equipment and Method: Stylet and Oral airway Placement Confirmation: ETT inserted through vocal cords under direct vision,  positive ETCO2 and breath sounds checked- equal and bilateral Secured at: 21 cm Tube secured with: Tape Dental Injury: Teeth and Oropharynx as per pre-operative assessment

## 2018-07-22 NOTE — Progress Notes (Signed)
Patient ID: [name], male   DOB: December 28, 1940, 77 y.o.   MRN: [id number] All signs are stable Motor function appears about the same Patient is speaking in words Tolerated surgery well

## 2018-07-22 NOTE — Telephone Encounter (Signed)
FYI- Patient's wife [name] calling stating the reason the patient cannot walk is because his brain has shrunk. He is in the hospital and is going to have brain surgery today to drain fluid off. A returned call is not needed unless there are questions.

## 2018-07-22 NOTE — Telephone Encounter (Signed)
Called pt wife--stated [name] having brain surgery today. Pt wife stated went back taking the Rx Trajenta and doing much better with his sugar.

## 2018-07-22 NOTE — Progress Notes (Signed)
[facility] ROUNDING NOTE   Subjective:   Awake and alert this morning.  Swallowing evaluation performed by speech.  Bilateral chronic subdural hematoma.  Neurosurgery following.  Appreciate help.   Blood pressure 141/99 pulse 79 room air sats 100%  Objective:  Vital signs in last 24 hours:  Temp:  [97.4 F (36.3 C)-98.4 F (36.9 C)] 97.9 F (36.6 C) (10/29 0743) Pulse Rate:  [71-86] 85 (10/29 0743) Resp:  [16-20] 20 (10/29 0743) BP: (118-201)/(62-111) 141/99 (10/29 0817) SpO2:  [96 %-100 %] 100 % (10/29 0743) Weight:  [69 kg] 69 kg (10/28 1330)  Weight change: 0.053 kg Filed Weights   07/20/18 2220 07/21/18 1330  Weight: 68.9 kg 69 kg    Intake/Output: I/O last 3 completed shifts: In: 61 [I.V.:36] Out: 1300 [Other:1300]   Intake/Output this shift:  No intake/output data recorded. Distressed alert and oriented in all extremities CVS- RRR no murmurs rubs or gallops JVP not elevated RS- CTA ABD- BS present soft non-distended EXT- no edema AV fistula right thrill and bruit   Basic Metabolic Panel: Recent Labs  Lab 07/21/18 0129 07/21/18 0151 07/21/18 1026  NA 136 134* 138  K 3.7 3.6 3.8  CL 96* 95* 94*  CO2 31  --  29  GLUCOSE 107* 101* 95  BUN 31* 37* 34*  CREATININE 7.36* 8.10* 8.07*  CALCIUM 11.2*  --  11.1*  PHOS  --   --  6.4*    Liver Function Tests: Recent Labs  Lab 07/21/18 0129 07/21/18 1026  AST 35  --   ALT 13  --   ALKPHOS 84  --   BILITOT 0.8  --   PROT 5.1*  --   ALBUMIN 2.5* 2.5*   No results for input(s): LIPASE, AMYLASE in the last 168 hours. No results for input(s): AMMONIA in the last 168 hours.  CBC: Recent Labs  Lab 07/21/18 0129 07/21/18 0151 07/21/18 1026  WBC 8.6  --  7.7  NEUTROABS 5.3  --   --   HGB 10.6* 11.9* 10.9*  HCT 32.7* 35.0* 34.7*  MCV 100.0  --  100.0  PLT 248  --  264    Cardiac Enzymes: No results for input(s): CKTOTAL, CKMB, CKMBINDEX, TROPONINI in the last 168  hours.  BNP: Invalid input(s): POCBNP  CBG: Recent Labs  Lab 07/21/18 1700 07/21/18 2124 07/21/18 2158 07/22/18 0106 07/22/18 0608  GLUCAP 93 64* 109* 45 82    Microbiology: Results for orders placed or performed during the hospital encounter of 07/20/18  Surgical PCR screen     Status: None   Collection Time: 07/22/18 12:00 AM  Result Value Ref Range Status   MRSA, PCR NEGATIVE NEGATIVE Final   Staphylococcus aureus NEGATIVE NEGATIVE Final    Comment: (NOTE) The Xpert SA Assay (FDA approved for NASAL specimens in patients 58 years of age and older), is one component of a comprehensive surveillance program. It is not intended to diagnose infection nor to guide or monitor treatment. Performed at [facility], [place] [address] [place], [place] [address]     Coagulation Studies: Recent Labs    07/22/18 0612  LABPROT 12.8  INR 0.97    Urinalysis: Recent Labs    07/21/18 1044  COLORURINE YELLOW  LABSPEC 1.009  PHURINE 9.0*  GLUCOSEU NEGATIVE  HGBUR NEGATIVE  BILIRUBINUR NEGATIVE  KETONESUR NEGATIVE  PROTEINUR 100*  NITRITE NEGATIVE  LEUKOCYTESUR SMALL*      Imaging: Dg Ribs Unilateral W/chest Left  Result  Date: 07/20/2018 CLINICAL DATA:  Fall 2 days ago with left rib pain. EXAM: LEFT RIBS AND CHEST - 3+ VIEW COMPARISON:  None. FINDINGS: Lungs are hypoinflated without consolidation, effusion or pneumothorax. Cardiomediastinal silhouette is within normal. Findings suggest subtle fractures of the lateral left tenth rib and anterolateral left ninth rib. Possible lateral left seventh rib fracture. IMPRESSION: No acute cardiopulmonary disease. Possible subtle fractures of the left seventh, ninth and tenth ribs as described. Electronically Signed   By: Marin Olp M.D.   On: 07/20/2018 23:37   Ct Head Wo Contrast  Result Date: 07/20/2018 CLINICAL DATA:  77 y/o M; altered mental status. Unclear cause. Several falls today. EXAM: CT HEAD WITHOUT CONTRAST  TECHNIQUE: Contiguous axial images were obtained from the base of the skull through the vertex without intravenous contrast. COMPARISON:  04/12/2015 MRI head. FINDINGS: Brain: Bilateral subdural hematomas on the right measuring up to 18 mm in thickness on the left up to 15 mm in thickness (series 5, image 37). The subdural hematomas are largely hypointense, however, there is gradient increased attenuation of the right-sided collection and and high attenuation retracted clot in the left-sided collection indicating recent hemorrhage. Mass effect on the brain results in diffuse sulcal effacement. There is no midline shift or downward herniation this time. No findings of stroke, brain parenchymal hemorrhage, or focal mass effect of the brain. Moderate volume loss of the brain is stable from prior MRI of the head given differences in technique. There is a punctate calcification in the right frontal lobe compatible sequelae of prior infectious or inflammatory process. Vascular: Calcific atherosclerosis of the carotid siphons and vertebral arteries. No hyperdense vessel identified. Skull: Normal. Negative for fracture or focal lesion. Sinuses/Orbits: No acute finding. Other: Bilateral intra-ocular lens replacement. IMPRESSION: Large mixed attenuation bilateral subdural hematomas measuring up to 18 mm in thickness on the right and 15 mm on the left, likely acute on chronic. Mass effect on the brain with concave deformation and sulcal effacement. No midline shift or herniation. Critical Value/emergent results were called by telephone at the time of interpretation on 07/20/2018 at 11:55 pm to Dr. Quincy Carnes , who verbally acknowledged these results. Electronically Signed   By: [name] M.D.   On: 07/20/2018 23:57     Medications:   . sodium chloride    . dextrose 25 mL/hr at 07/22/18 0308   . Chlorhexidine Gluconate Cloth  6 each Topical Q0600  . donepezil  10 mg Oral QHS  . insulin aspart  0-5  Units Subcutaneous QHS  . insulin aspart  0-9 Units Subcutaneous TID WC  . levothyroxine  50 mcg Oral QAC breakfast  . memantine  10 mg Oral BID  . mupirocin ointment  1 application Nasal BID  . pantoprazole  40 mg Oral Daily  . polyethylene glycol  17 g Oral Daily  . sevelamer carbonate  800 mg Oral Q supper  . simvastatin  20 mg Oral QHS  . sodium chloride flush  3 mL Intravenous Q12H  . sodium chloride flush  3 mL Intravenous Q12H  . vitamin B-12  1,000 mcg Oral Daily   sodium chloride, acetaminophen **OR** acetaminophen, fentaNYL (SUBLIMAZE) injection, ondansetron **OR** ondansetron (ZOFRAN) IV, polyvinyl alcohol, sodium chloride flush  Assessment/ Plan:  Dialysis Orders: MWF [facility] 4 hrs 180 NRe 400/Autoflow 1.5 71 kg 2.0K/2.0 Ca  R AVF -Heparin 1800 units IV -Mircera 150 mcg IV q 2 weeks (last dose 07/16/18 Last HGB 11.4 07/16/18)  Assessment/Plan: 1.  Bilateral acute  on chronic subdural hematoma-Per neurosurgery 2.  Multiple rib fractures-per primary. Does not seem to be painful.  3.  ESRD -  MWF  . K+ 4.7 no heparin dialysis 4.  Hypertension/volume  - Hypertensive at present. Avoid hypotension while on HD. Pre wt 68.9 kg-has been leaving under EDW at OP center. UFG 1.3 liters removed 07/21/2018. 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  5.  Anemia  -hemoglobin 10.9 6.  Metabolic bone disease -  Ca 8.7. NPO at present. Continue binders when able to eat.   Phosphorus 6.4 Albumin 2.5 7.  Nutrition -NPO  8.  DM-per primary 9.   Hypothyroidism-per primary 10  Dementia-per primary    LOS: [place] @TODAY @10 :25 AM

## 2018-07-22 NOTE — Op Note (Signed)
Date of surgery: 07/22/2018 Preoperative diagnosis: Bilateral chronic subdural hematomas Postoperative diagnosis: Same Procedure: Bilateral bur holes for drainage of subdural hematomas. Surgeon: Kristeen Miss First assistant: Lenn Cal, MS 4 Anesthesia: General endotracheal Indications: [name]. [name] is a 77 year old individual who has end-stage renal disease on hemodialysis for a long period of time in addition to mild mental confusion that has been chronic diabetes mellitus type 2.  He had become increasingly confused and more prone to falling lately and it was discovered that he had large bilateral subdural hematomas.  These appear chronic in nature although the left one has some subacute blood in patient is now taken to the operating room to undergo bilateral bur hole drainage of the subdural hematomas.  Procedure: Patient was brought to the operating room supine on a stretcher.  After the smooth induction of general endotracheal anesthesia he was placed in the 3 pin headrest in the recumbent position.  The head was shaved prepped with alcohol DuraPrep and draped in a sterile fashion.  Then a standard posterior frontal bur hole was created in each side with a curvilinear incision over the bur hole on each side.  Skin was infiltrated with lidocaine with epinephrine first.  The dura on the right side was noted to be somewhat tense and this was incised and a moderate amount of fluid under pressure was released.  On the left side the bur hole was created but the dura was not tense.  The dura was opened in a cruciate fashion and very little fluid was evacuated although some dark-colored fluid was encountered.  The area was irrigated on both sides quite well in the end it was decided to place a small ventricular catheter in the subdural space and bring this out through separate stab incision.  The catheter was secured with a 4-0 nylon suture and the scalp was closed with 2-0 Vicryl interrupted fashion  and surgical staples were used in the scalp.  The drains were connected to ventricular catheter drainage sets which will be Lower than the head of bed.  Tolerated procedure well blood loss was minimal

## 2018-07-22 NOTE — Transfer of Care (Signed)
Immediate Anesthesia Transfer of Care Note  Patient: [name]  Procedure(s) Performed: BILATERAL BURR HOLES (Bilateral Head)  Patient Location: PACU  Anesthesia Type:General  Level of Consciousness: drowsy and responds to stimulation  Airway & Oxygen Therapy: Patient Spontanous Breathing  Post-op Assessment: Report given to RN and Post -op Vital signs reviewed and stable  Post vital signs: Reviewed and stable  Last Vitals:  Vitals Value Taken Time  BP    Temp    Pulse    Resp    SpO2      Last Pain:  Vitals:   07/22/18 1400  TempSrc:   PainSc: 0-No pain         Complications: No apparent anesthesia complications

## 2018-07-23 ENCOUNTER — Encounter (HOSPITAL_COMMUNITY): Payer: Self-pay | Admitting: Neurological Surgery

## 2018-07-23 DIAGNOSIS — E1129 Type 2 diabetes mellitus with other diabetic kidney complication: Secondary | ICD-10-CM

## 2018-07-23 DIAGNOSIS — Z794 Long term (current) use of insulin: Secondary | ICD-10-CM

## 2018-07-23 DIAGNOSIS — N186 End stage renal disease: Secondary | ICD-10-CM

## 2018-07-23 DIAGNOSIS — S2242XA Multiple fractures of ribs, left side, initial encounter for closed fracture: Secondary | ICD-10-CM

## 2018-07-23 DIAGNOSIS — E039 Hypothyroidism, unspecified: Secondary | ICD-10-CM

## 2018-07-23 DIAGNOSIS — I6203 Nontraumatic chronic subdural hemorrhage: Secondary | ICD-10-CM

## 2018-07-23 DIAGNOSIS — E119 Type 2 diabetes mellitus without complications: Secondary | ICD-10-CM

## 2018-07-23 DIAGNOSIS — I6201 Nontraumatic acute subdural hemorrhage: Secondary | ICD-10-CM

## 2018-07-23 DIAGNOSIS — D638 Anemia in other chronic diseases classified elsewhere: Secondary | ICD-10-CM

## 2018-07-23 DIAGNOSIS — Z992 Dependence on renal dialysis: Secondary | ICD-10-CM

## 2018-07-23 LAB — RENAL FUNCTION PANEL
Albumin: 2.7 g/dL — ABNORMAL LOW (ref 3.5–5.0)
Anion gap: 12 (ref 5–15)
BUN: 19 mg/dL (ref 8–23)
CO2: 25 mmol/L (ref 22–32)
Calcium: 9.8 mg/dL (ref 8.9–10.3)
Chloride: 99 mmol/L (ref 98–111)
Creatinine, Ser: 6.01 mg/dL — ABNORMAL HIGH (ref 0.61–1.24)
GFR calc Af Amer: 9 mL/min — ABNORMAL LOW (ref >60)
GFR calc non Af Amer: 8 mL/min — ABNORMAL LOW (ref >60)
Glucose, Bld: 122 mg/dL — ABNORMAL HIGH (ref 70–99)
Phosphorus: 7.1 mg/dL — ABNORMAL HIGH (ref 2.5–4.6)
Potassium: 4 mmol/L (ref 3.5–5.1)
Sodium: 136 mmol/L (ref 135–145)

## 2018-07-23 LAB — CBC
HCT: 32.8 % — ABNORMAL LOW (ref 39.0–52.0)
Hemoglobin: 10.8 g/dL — ABNORMAL LOW (ref 13.0–17.0)
MCH: 32.4 pg (ref 26.0–34.0)
MCHC: 32.9 g/dL (ref 30.0–36.0)
MCV: 98.5 fL (ref 80.0–100.0)
Platelets: 180 10*3/uL (ref 150–400)
RBC: 3.33 MIL/uL — ABNORMAL LOW (ref 4.22–5.81)
RDW: 15.9 % — ABNORMAL HIGH (ref 11.5–15.5)
WBC: 8.2 10*3/uL (ref 4.0–10.5)
nRBC: 0.2 % (ref 0.0–0.2)

## 2018-07-23 LAB — GLUCOSE, CAPILLARY
Glucose-Capillary: 160 mg/dL — ABNORMAL HIGH (ref 70–99)
Glucose-Capillary: 141 mg/dL — ABNORMAL HIGH (ref 70–99)
Glucose-Capillary: 69 mg/dL — ABNORMAL LOW (ref 70–99)
Glucose-Capillary: 112 mg/dL — ABNORMAL HIGH (ref 70–99)
Glucose-Capillary: 173 mg/dL — ABNORMAL HIGH (ref 70–99)

## 2018-07-23 MED ORDER — LIDOCAINE HCL (PF) 1 % IJ SOLN: 5.0000 mL | INTRAMUSCULAR | Status: DC | PRN

## 2018-07-23 MED ORDER — DEXTROSE 50 % IV SOLN
INTRAVENOUS | Status: AC
  Filled 2018-07-23: qty 50

## 2018-07-23 MED ORDER — SODIUM CHLORIDE 0.9 % IV SOLN: 100.0000 mL | INTRAVENOUS | Status: DC | PRN

## 2018-07-23 MED ORDER — FENTANYL CITRATE (PF) 100 MCG/2ML IJ SOLN
INTRAMUSCULAR | Status: AC
  Filled 2018-07-23: qty 2

## 2018-07-23 MED ORDER — LIDOCAINE-PRILOCAINE 2.5-2.5 % EX CREA: 1.0000 "application " | TOPICAL_CREAM | CUTANEOUS | Status: DC | PRN

## 2018-07-23 MED ORDER — DEXTROSE 50 % IV SOLN
25.0000 mL | Freq: Once | INTRAVENOUS | Status: AC
  Administered 2018-07-23: 25 mL via INTRAVENOUS

## 2018-07-23 MED ORDER — HEPARIN SODIUM (PORCINE) 1000 UNIT/ML DIALYSIS: 1000.0000 [IU] | INTRAMUSCULAR | Status: DC | PRN

## 2018-07-23 MED ORDER — PENTAFLUOROPROP-TETRAFLUOROETH EX AERO: 1.0000 "application " | INHALATION_SPRAY | CUTANEOUS | Status: DC | PRN

## 2018-07-23 MED FILL — Thrombin (Recombinant) For Soln 20000 Unit: CUTANEOUS | Qty: 1 | Status: AC

## 2018-07-23 NOTE — Progress Notes (Signed)
Patient ID: [name], male   DOB: 05/06/1941, 77 y.o.   MRN: [id number] On hemodialysis stable postop

## 2018-07-23 NOTE — Progress Notes (Signed)
[facility] PROGRESS NOTE  Johnny BEVILLE DXA:[id number] DOB: 1941/05/24 DOA: 07/20/2018  PCP: Elby Showers, MD  Brief History/Interval Summary: 77 y.o.malewith medical history significant fortype 2 diabetes mellitus, hypothyroidism, dementia, and end-stage renal disease on hemodialysis, presented to the emergency department with increased confusion. Patient was admitted last month with GI bleeding and generalized weakness, was transfused, but has continued to experience generalized weakness and his wife has noted some difficulty with gait and reports that he has fallen multiple times over the last few months.  Evaluation was concerning for acute on chronic subdural hematoma.  Patient was hospitalized for further management.  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  Nephrology.  Procedures: Bilateral bur holes for drainage of subdural hematomas  Antibiotics: None  Subjective/Interval History: Patient noted to be confused.  Follows commands.  Denies any headaches.  Seen at hemodialysis.  ROS: Unable to do due to his confusion  Objective:  Vital Signs  Vitals:   07/23/18 0845 07/23/18 0915 07/23/18 0930 07/23/18 1000  BP: (!) 167/69 (!) 143/39 (!) 146/69 (!) 142/73  Pulse: 73 68 68 72  Resp:      Temp:      TempSrc:      SpO2:      Weight:      Height:        Intake/Output Summary (Last 24 hours) at 07/23/2018 1026 Last data filed at 07/23/2018 1000 Gross per 24 hour  Intake 1456.96 ml  Output 200 ml  Net 1256.96 ml   Filed Weights   07/20/18 2220 07/21/18 1330 07/23/18 0815  Weight: 68.9 kg 69 kg 69.9 kg    General appearance: alert, distracted and no distress Head: Normocephalic, without obvious abnormality, atraumatic Resp: clear to auscultation bilaterally Cardio: regular rate and rhythm, S1, S2 normal, no murmur, click, rub or gallop GI: soft, non-tender; bowel sounds normal; no masses,  no  organomegaly Extremities: extremities normal, atraumatic, no cyanosis or edema Pulses: 2+ and symmetric Neurologic: Slow to respond.  Follows commands.  Moving all his extremities.  Lab Results:  Data Reviewed: I have personally reviewed following labs and imaging studies  CBC: Recent Labs  Lab 07/21/18 0129 07/21/18 0151 07/21/18 1026 07/22/18 1630 07/23/18 0839  WBC 8.6  --  7.7  --  8.2  NEUTROABS 5.3  --   --   --   --   HGB 10.6* 11.9* 10.9* 12.9* 10.8*  HCT 32.7* 35.0* 34.7* 38.0* 32.8*  MCV 100.0  --  100.0  --  98.5  PLT 248  --  264  --  209    Basic Metabolic Panel: Recent Labs  Lab 07/21/18 0129 07/21/18 0151 07/21/18 1026 07/22/18 1630 07/23/18 0839  NA 136 134* 138 135 136  K 3.7 3.6 3.8 3.5 4.0  CL 96* 95* 94*  --  99  CO2 31  --  29  --  25  GLUCOSE 107* 101* 95 94 122*  BUN 31* 37* 34*  --  19  CREATININE 7.36* 8.10* 8.07*  --  6.01*  CALCIUM 11.2*  --  11.1*  --  9.8  PHOS  --   --  6.4*  --  7.1*    GFR: Estimated Creatinine Clearance: 10.3 mL/min (A) (by C-G formula based on SCr of 6.01 mg/dL (H)).  Liver Function Tests: Recent Labs  Lab 07/21/18 0129 07/21/18 1026 07/23/18 0839  AST 35  --   --   ALT 13  --   --  ALKPHOS 84  --   --   BILITOT 0.8  --   --   PROT 5.1*  --   --   ALBUMIN 2.5* 2.5* 2.7*    Coagulation Profile: Recent Labs  Lab 07/22/18 0612  INR 0.97    CBG: Recent Labs  Lab 07/22/18 1143 07/22/18 1536 07/22/18 1934 07/22/18 2247 07/23/18 0344  GLUCAP 93 105* 129* 126* 160*     Recent Results (from the past 240 hour(s))  Surgical PCR screen     Status: None   Collection Time: 07/22/18 12:00 AM  Result Value Ref Range Status   MRSA, PCR NEGATIVE NEGATIVE Final   Staphylococcus aureus NEGATIVE NEGATIVE Final    Comment: (NOTE) The Xpert SA Assay (FDA approved for NASAL specimens in patients 68 years of age and older), is one component of a comprehensive surveillance program. It is not intended to  diagnose infection nor to guide or monitor treatment. Performed at [facility], [place] [address], [place], [place] [address]       Radiology Studies: No results found.   Medications:  Scheduled: . Chlorhexidine Gluconate Cloth  6 each Topical Q0600  . Chlorhexidine Gluconate Cloth  6 each Topical Q0600  . docusate sodium  100 mg Oral BID  . donepezil  10 mg Oral QHS  . insulin aspart  0-5 Units Subcutaneous QHS  . insulin aspart  0-9 Units Subcutaneous TID WC  . levothyroxine  50 mcg Oral QAC breakfast  . memantine  10 mg Oral BID  . mupirocin ointment  1 application Nasal BID  . pantoprazole  40 mg Oral Daily  . polyethylene glycol  17 g Oral Daily  . senna  1 tablet Oral BID  . sevelamer carbonate  800 mg Oral Q supper  . simvastatin  20 mg Oral QHS  . sodium chloride flush  3 mL Intravenous Q12H  . sodium chloride flush  3 mL Intravenous Q12H  . vitamin B-12  1,000 mcg Oral Daily   Continuous: . sodium chloride    . sodium chloride    . sodium chloride    . sodium chloride 10 mL/hr at 07/23/18 1000  . dextrose Stopped (07/22/18 1531)  . 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  Patient underwent bur hole on 10/29.  Drains are in place.  Discussed with Dr. Ellene Route today.  He was very concerned about an acute component on the left side.  Patient mental status seems to be stable.  He has dementia at baseline.  Patient noted to be on Keppra presumably for prophylactic purposes.  Multiple rib fractures Incentive  spirometry.  Pain control.  End-stage renal disease on dialysis on Monday Wednesday Friday Nephrology is following.  Defer to them.  History of dementia Patient on Aricept and Namenda at home.  Speech therapy to see to rule out oropharyngeal dysphagia.  Hypothyroidism Synthroid when able to take orally.  Diabetes mellitus type 2 HbA1c 5.2.  Continue SSI.  Anemia of chronic disease Hemoglobin is stable.  DVT Prophylaxis: SCDs    Code Status: Full code Family Communication: No family at bedside Disposition Plan: Management as outlined above.  Speech therapy to see.  PT and OT evaluation.  LOS: 2 days   [facility] Pager [phone] 07/23/2018, 10:26 AM  If 7PM-7AM, please contact night-coverage at [email or web address], password [facility]

## 2018-07-23 NOTE — Progress Notes (Signed)
[facility] ROUNDING NOTE   Subjective:   Awake and alert this morning.  Patient seen on dialysis.  Appears to be doing well status post [employer] day #1 for chronic bilateral subdural hemorrhage appreciate help from Dr. Ellene Route  Blood pressure 118/82 pulse 62 temperature 97.4 O2 sats 96% room air  Sodium 136 potassium 4.0 chloride 99 CO2 25 BUN 19 creatinine 6.01 glucose 122 calcium 9.8 phosphorus 7.1 albumin 2.7 WBC 8.2 hemoglobin 10.8 platelets 180   Objective:  Vital signs in last 24 hours:  Temp:  [97.4 F (36.3 C)-98.6 F (37 C)] 97.4 F (36.3 C) (10/30 0815) Pulse Rate:  [61-101] 62 (10/30 1200) Resp:  [12-28] 17 (10/30 0825) BP: (94-172)/(39-105) 118/82 (10/30 1200) SpO2:  [91 %-99 %] 96 % (10/30 0815) Weight:  [69.9 kg] 69.9 kg (10/30 0815)  Weight change:  Filed Weights   07/20/18 2220 07/21/18 1330 07/23/18 0815  Weight: 68.9 kg 69 kg 69.9 kg    Intake/Output: I/O last 3 completed shifts: In: 986 [I.V.:486; IV Piggyback:500] Out: 200 [Urine:10; Drains:140; Blood:50]   Intake/Output this shift:  Total I/O In: 507 [I.V.:372.5; IV Piggyback:134.4] Out: -  Distressed alert and oriented in all extremities CVS- RRR no murmurs rubs or gallops JVP not elevated RS- CTA ABD- BS present soft non-distended EXT- no edema AV fistula right thrill and bruit Neurology follows commands moving all extremities   Basic Metabolic Panel: Recent Labs  Lab 07/21/18 0129 07/21/18 0151 07/21/18 1026 07/22/18 1630 07/23/18 0839  NA 136 134* 138 135 136  K 3.7 3.6 3.8 3.5 4.0  CL 96* 95* 94*  --  99  CO2 31  --  29  --  25  GLUCOSE 107* 101* 95 94 122*  BUN 31* 37* 34*  --  19  CREATININE 7.36* 8.10* 8.07*  --  6.01*  CALCIUM 11.2*  --  11.1*  --  9.8  PHOS  --   --  6.4*  --  7.1*    Liver Function Tests: Recent Labs  Lab 07/21/18 0129 07/21/18 1026 07/23/18 0839  AST 35  --   --   ALT 13  --   --   ALKPHOS 84  --   --   BILITOT 0.8  --   --    PROT 5.1*  --   --   ALBUMIN 2.5* 2.5* 2.7*   No results for input(s): LIPASE, AMYLASE in the last 168 hours. No results for input(s): AMMONIA in the last 168 hours.  CBC: Recent Labs  Lab 07/21/18 0129 07/21/18 0151 07/21/18 1026 07/22/18 1630 07/23/18 0839  WBC 8.6  --  7.7  --  8.2  NEUTROABS 5.3  --   --   --   --   HGB 10.6* 11.9* 10.9* 12.9* 10.8*  HCT 32.7* 35.0* 34.7* 38.0* 32.8*  MCV 100.0  --  100.0  --  98.5  PLT 248  --  264  --  180    Cardiac Enzymes: No results for input(s): CKTOTAL, CKMB, CKMBINDEX, TROPONINI in the last 168 hours.  BNP: Invalid input(s): POCBNP  CBG: Recent Labs  Lab 07/22/18 1143 07/22/18 1536 07/22/18 1934 07/22/18 2247 07/23/18 0344  GLUCAP 93 105* 129* 126* 160*    Microbiology: Results for orders placed or performed during the hospital encounter of 07/20/18  Surgical PCR screen     Status: None   Collection Time: 07/22/18 12:00 AM  Result Value Ref Range Status   MRSA, PCR NEGATIVE NEGATIVE  Final   Staphylococcus aureus NEGATIVE NEGATIVE Final    Comment: (NOTE) The Xpert SA Assay (FDA approved for NASAL specimens in patients 70 years of age and older), is one component of a comprehensive surveillance program. It is not intended to diagnose infection nor to guide or monitor treatment. Performed at [facility], [place] [address], [place], [place] [address]     Coagulation Studies: Recent Labs    07/22/18 0612  LABPROT 12.8  INR 0.97    Urinalysis: Recent Labs    07/21/18 1044  COLORURINE YELLOW  LABSPEC 1.009  PHURINE 9.0*  GLUCOSEU NEGATIVE  HGBUR NEGATIVE  BILIRUBINUR NEGATIVE  KETONESUR NEGATIVE  PROTEINUR 100*  NITRITE NEGATIVE  LEUKOCYTESUR SMALL*      Imaging: No results found.   Medications:   . sodium chloride    . sodium chloride    . sodium chloride    . sodium chloride 10 mL/hr at 07/23/18 1000  . dextrose Stopped (07/22/18 1531)  . levETIRAcetam 500 mg (07/23/18 0754)    . Chlorhexidine Gluconate Cloth  6 each Topical Q0600  . Chlorhexidine Gluconate Cloth  6 each Topical Q0600  . docusate sodium  100 mg Oral BID  . donepezil  10 mg Oral QHS  . insulin aspart  0-5 Units Subcutaneous QHS  . insulin aspart  0-9 Units Subcutaneous TID WC  . levothyroxine  50 mcg Oral QAC breakfast  . memantine  10 mg Oral BID  . mupirocin ointment  1 application Nasal BID  . pantoprazole  40 mg Oral Daily  . polyethylene glycol  17 g Oral Daily  . senna  1 tablet Oral BID  . sevelamer carbonate  800 mg Oral Q supper  . simvastatin  20 mg Oral QHS  . sodium chloride flush  3 mL Intravenous Q12H  . sodium chloride flush  3 mL Intravenous Q12H  . vitamin B-12  1,000 mcg Oral Daily   sodium chloride, sodium chloride, sodium chloride, acetaminophen **OR** acetaminophen, bisacodyl, fentaNYL (SUBLIMAZE) injection, heparin, labetalol, lidocaine (PF), lidocaine-prilocaine, ondansetron **OR** ondansetron (ZOFRAN) IV, pentafluoroprop-tetrafluoroeth, polyethylene glycol, polyvinyl alcohol, promethazine, sodium chloride flush, sodium phosphate  Assessment/ Plan:  Dialysis Orders: MWF [facility] 4 hrs 180 NRe 400/Autoflow 1.5 71 kg 2.0K/2.0 Ca  R AVF -Heparin 1800 units IV -Mircera 150 mcg IV q 2 weeks (last dose 07/16/18 Last HGB 11.4 07/16/18)  Assessment/Plan: 1.  Bilateral acute on chronic subdural hematoma-status post bur hole placement day #1 2.  Multiple rib fractures-per primary. Does not seem to be painful.  3.  ESRD -  MWF  . K+ 4.7 no heparin dialysis 4.  Hypertension/volume  - Hypertensive at present. Avoid hypotension while on HD. Pre wt 68.9 kg-has been leaving under EDW at OP center. UFG 1.3 liters removed 07/21/2018. 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  5.  Anemia  -hemoglobin 10.9 6.  Metabolic bone disease -  Ca 8.7. NPO at present. Continue binders when able to eat.   Phosphorus 6.4 Albumin 2.5 7.  Nutrition -NPO  8.  DM-per primary 9.    Hypothyroidism-per primary 10  Dementia-per primary    LOS: [place] @TODAY @12 :12 PM

## 2018-07-23 NOTE — Progress Notes (Signed)
Hypoglycemic Event  CBG: *69  Treatment: D50 IV 25 mL  Symptoms: None  Follow-up CBG: Time:*1408 CBG Result:*141  Possible Reasons for Event: Other: dialysis  Comments/MD notified:* notified Maryland Pink MD    Derek Mound

## 2018-07-23 NOTE — Evaluation (Addendum)
Clinical/Bedside Swallow Evaluation Patient Details  Name: [name] MRN: [id number] Date of Birth: [birth year]  Today's Date: 07/23/2018 Time: SLP Start Time (ACUTE ONLY): 1450 SLP Stop Time (ACUTE ONLY): 1512 SLP Time Calculation (min) (ACUTE ONLY): 22 min  Past Medical History:  Past Medical History:  Diagnosis Date  . Allergy   . Anemia   . Arthritis   . Cataract    bil cateracts removed  . Coronary artery disease   . Diabetes mellitus    Type 2  . Diverticulitis   . ED (erectile dysfunction)   . 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 ESRD (end stage renal disease) on dialysis ([place]) 03/2015  . GERD (gastroesophageal reflux disease)    pepto   . Gout   . Hyperlipidemia   . Hypertension   . Hypothyroidism   . Pneumonia   . PVD (peripheral vascular disease) ([place])    has plastic aorta  . Renal insufficiency   . Seasonal allergies   . Shortness of breath dyspnea   . Sleep apnea    does not wear c-pap  . Thyroid disease    Past Surgical History:  Past Surgical History:  Procedure Laterality Date  . aortobifemoral bypass    . AV FISTULA PLACEMENT Left 12/01/2013   Procedure: ARTERIOVENOUS (AV) FISTULA CREATION- LEFT BRACHIOCEPHALIC;  Surgeon: Angelia Mould, MD;  Location: [facility];  Service: Vascular;  Laterality: Left;  . [place] TRANSPOSITION Right 07/27/2014   Procedure: BASCILIC VEIN TRANSPOSITION;  Surgeon: Angelia Mould, MD;  Location: [facility];  Service: Vascular;  Laterality: Right;  . BREAST SURGERY     left - granulomatous mastitis  . BURR HOLE Bilateral 07/22/2018   Procedure: BILATERAL BURR HOLES;  Surgeon: Kristeen Miss, MD;  Location: [facility];  Service: Neurosurgery;  Laterality: Bilateral;  . COLONOSCOPY    . 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 ENTEROSCOPY N/A 06/03/2018   Procedure: ENTEROSCOPY;  Surgeon: Lavena Bullion, DO;  Location: [facility];  Service: Gastroenterology;  Laterality: N/A;  . EYE SURGERY Bilateral     cataracts  . HEMODIALYSIS INPATIENT  01/17/2018      . HOT HEMOSTASIS N/A 06/03/2018   Procedure: HOT HEMOSTASIS (ARGON PLASMA COAGULATION/BICAP);  Surgeon: Lavena Bullion, DO;  Location: [facility] ENDOSCOPY;  Service: Gastroenterology;  Laterality: N/A;  . REVISON OF ARTERIOVENOUS FISTULA Left 02/09/2014   Procedure: REVISON OF LEFT ARTERIOVENOUS FISTULA - RESECTION OF RENDUNDANT VEIN;  Surgeon: Angelia Mould, MD;  Location: [facility];  Service: Vascular;  Laterality: Left;  . SBO with lysis adhesions    . SHUNTOGRAM Left 04/19/2014   Procedure: FISTULOGRAM;  Surgeon: Angelia Mould, MD;  Location: [facility] CATH LAB;  Service: Cardiovascular;  Laterality: Left;  . UNILATERAL UPPER EXTREMEITY ANGIOGRAM N/A 07/12/2014   Procedure: UNILATERAL UPPER Anselmo Rod;  Surgeon: Angelia Mould, MD;  Location: [facility] CATH LAB;  Service: Cardiovascular;  Laterality: N/A;   HPI:  [name] is a 77 year old individual who has end-stage renal disease on hemodialysis for a long period of time in addition to mild mental confusion that has been chronic diabetes mellitus type 2.  He had become increasingly confused and more prone to falling lately and it was discovered that he had large bilateral subdural hematomas.  These appear chronic in nature although the left one has some subacute blood; pt take to OR on 10/29 for bilateral bur hole drainage of the subdural hematomas.   Assessment /  Plan / Recommendation Clinical Impression  Pt demonstrates normal swallow function. Able to attend to and consume regular solids and thin liquids without difficulty. Will initiate regular diet and thin liquids and sign off for swallowing. No cognitive linguistic order at this time, pt appears to have made significant improvement in cognition due to surgery, but memory deficits still evident. Would suggest referral to CIR for f/u on cognition. Will also discuss with PT.  SLP Visit Diagnosis: Dysphagia, unspecified  (R13.10)    Aspiration Risk  Mild aspiration risk    Diet Recommendation Regular;Thin liquid   Liquid Administration via: Cup;Straw Medication Administration: Whole meds with liquid Supervision: Patient able to self feed    Other  Recommendations     Follow up Recommendations        Frequency and Duration            Prognosis        Swallow Study   General HPI: [name] is a 77 year old individual who has end-stage renal disease on hemodialysis for a long period of time in addition to mild mental confusion that has been chronic diabetes mellitus type 2.  He had become increasingly confused and more prone to falling lately and it was discovered that he had large bilateral subdural hematomas.  These appear chronic in nature although the left one has some subacute blood; pt take to OR on 10/29 for bilateral bur hole drainage of the subdural hematomas. Type of Study: Bedside Swallow Evaluation Previous Swallow Assessment: none Diet Prior to this Study: NPO Temperature Spikes Noted: No Respiratory Status: Room air History of Recent Intubation: No Behavior/Cognition: Alert;Cooperative;Pleasant mood Oral Cavity Assessment: Within Functional Limits Oral Care Completed by SLP: Yes Oral Cavity - Dentition: Adequate natural dentition Vision: Functional for self-feeding Self-Feeding Abilities: Able to feed self Patient Positioning: Upright in bed Baseline Vocal Quality: Normal Volitional Cough: Strong Volitional Swallow: Able to elicit    Oral/Motor/Sensory Function Overall Oral Motor/Sensory Function: Within functional limits   Ice Chips     Thin Liquid Thin Liquid: Within functional limits Presentation: Cup;Straw;Self Fed    Nectar Thick Nectar Thick Liquid: Not tested   Honey Thick Honey Thick Liquid: Not tested   Puree Puree: Within functional limits Presentation: Self Fed;Spoon   Solid     Solid: Within functional limits Presentation: Self Fed       Lisha Vitale, Katherene Ponto 07/23/2018,3:26 PM

## 2018-07-23 NOTE — Progress Notes (Signed)
Pt. Removed left subdural drain, on call neurosurgery provider notified.  Ct scan ordered for the morning.

## 2018-07-24 ENCOUNTER — Inpatient Hospital Stay (HOSPITAL_COMMUNITY): Payer: Medicare Other

## 2018-07-24 LAB — GLUCOSE, CAPILLARY
Glucose-Capillary: 108 mg/dL — ABNORMAL HIGH (ref 70–99)
Glucose-Capillary: 106 mg/dL — ABNORMAL HIGH (ref 70–99)
Glucose-Capillary: 111 mg/dL — ABNORMAL HIGH (ref 70–99)
Glucose-Capillary: 154 mg/dL — ABNORMAL HIGH (ref 70–99)

## 2018-07-24 MED ORDER — CHLORHEXIDINE GLUCONATE CLOTH 2 % EX PADS
6.0000 | MEDICATED_PAD | Freq: Every day | CUTANEOUS | Status: DC
  Administered 2018-07-26 – 2018-07-29 (×5): 6 via TOPICAL

## 2018-07-24 MED ORDER — DIPHENHYDRAMINE HCL 25 MG PO CAPS
25.0000 mg | ORAL_CAPSULE | Freq: Four times a day (QID) | ORAL | Status: DC | PRN
  Administered 2018-07-24 – 2018-07-28 (×5): 25 mg via ORAL
  Filled 2018-07-24 (×5): qty 1

## 2018-07-24 NOTE — Progress Notes (Signed)
[facility] PROGRESS NOTE  [name] YWV:[id number] DOB: 1941-05-01 DOA: 07/20/2018  PCP: Elby Showers, MD  Brief History/Interval Summary: 77 y.o.malewith medical history significant fortype 2 diabetes mellitus, hypothyroidism, dementia, and end-stage renal disease on hemodialysis, presented to the emergency department with increased confusion. Patient was admitted last month with GI bleeding and generalized weakness, was transfused, but has continued to experience generalized weakness and his wife has noted some difficulty with gait and reports that he has fallen multiple times over the last few months.  Evaluation was concerning for acute on chronic subdural hematoma.  Patient was hospitalized for further management.  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  Nephrology.  Procedures: Bilateral burr holes for drainage of subdural hematomas  Antibiotics: None  Subjective/Interval History: Patient noted to be much more awake and alert this morning.  Still somewhat distracted.  Denies any headaches.     Objective:  Vital Signs  Vitals:   07/24/18 0700 07/24/18 0800 07/24/18 0900 07/24/18 1000  BP: (!) 132/57 (!) 148/67 (!) 139/113 (!) 143/68  Pulse: 69 62 65 71  Resp: 11 14 12    Temp:  98.3 F (36.8 C)    TempSrc:  Oral    SpO2: 95% 99% 100% 98%  Weight:      Height:        Intake/Output Summary (Last 24 hours) at 07/24/2018 1215 Last data filed at 07/24/2018 1000 Gross per 24 hour  Intake 917.85 ml  Output 1889 ml  Net -971.15 ml   Filed Weights   07/21/18 1330 07/23/18 0815 07/23/18 1221  Weight: 69 kg 69.9 kg 67.6 kg    General appearance: Awake alert.  Distracted.  In no distress Dressing noted over his scalp Resp: Normal effort at rest.  Clear to auscultation bilaterally.  No wheezing rales or rhonchi. Cardio: S1-S2 is normal regular.  No S3-S4.  No rubs murmurs or bruit GI: Abdomen is  soft.  Nontender nondistended.  Bowel sounds are present normal.  No masses organomegaly Extremities: No edema bilateral upper extremities Neurologic: Follows commands.  No obvious neurological deficits noted.  Lab Results:  Data Reviewed: I have personally reviewed following labs and imaging studies  CBC: Recent Labs  Lab 07/21/18 0129 07/21/18 0151 07/21/18 1026 07/22/18 1630 07/23/18 0839  WBC 8.6  --  7.7  --  8.2  NEUTROABS 5.3  --   --   --   --   HGB 10.6* 11.9* 10.9* 12.9* 10.8*  HCT 32.7* 35.0* 34.7* 38.0* 32.8*  MCV 100.0  --  100.0  --  98.5  PLT 248  --  264  --  854    Basic Metabolic Panel: Recent Labs  Lab 07/21/18 0129 07/21/18 0151 07/21/18 1026 07/22/18 1630 07/23/18 0839  NA 136 134* 138 135 136  K 3.7 3.6 3.8 3.5 4.0  CL 96* 95* 94*  --  99  CO2 31  --  29  --  25  GLUCOSE 107* 101* 95 94 122*  BUN 31* 37* 34*  --  19  CREATININE 7.36* 8.10* 8.07*  --  6.01*  CALCIUM 11.2*  --  11.1*  --  9.8  PHOS  --   --  6.4*  --  7.1*    GFR: Estimated Creatinine Clearance: 10 mL/min (A) (by C-G formula based on SCr of 6.01 mg/dL (H)).  Liver Function Tests: Recent Labs  Lab 07/21/18 0129 07/21/18 1026 07/23/18 0839  AST 35  --   --  ALT 13  --   --   ALKPHOS 84  --   --   BILITOT 0.8  --   --   PROT 5.1*  --   --   ALBUMIN 2.5* 2.5* 2.7*    Coagulation Profile: Recent Labs  Lab 07/22/18 0612  INR 0.97    CBG: Recent Labs  Lab 07/23/18 1408 07/23/18 1529 07/23/18 2011 07/24/18 0832 07/24/18 1200  GLUCAP 141* 112* 173* 108* 106*     Recent Results (from the past 240 hour(s))  Surgical PCR screen     Status: None   Collection Time: 07/22/18 12:00 AM  Result Value Ref Range Status   MRSA, PCR NEGATIVE NEGATIVE Final   Staphylococcus aureus NEGATIVE NEGATIVE Final    Comment: (NOTE) The Xpert SA Assay (FDA approved for NASAL specimens in patients 65 years of age and older), is one component of a comprehensive surveillance  program. It is not intended to diagnose infection nor to guide or monitor treatment. Performed at [facility], [place] [address] [place], [place] [address]       Radiology Studies: Ct Head Wo Contrast  Result Date: 07/24/2018 CLINICAL DATA:  Follow-up CT for bilateral subdural hematomas, status post evacuation. EXAM: CT HEAD WITHOUT CONTRAST TECHNIQUE: Contiguous axial images were obtained from the base of the skull through the vertex without intravenous contrast. COMPARISON:  Prior CT from 07/20/2018. FINDINGS: Brain: Fairly sizable mixed attenuation bilateral subdural hematomas again seen. There has been performance of bilateral frontal burr hole craniotomy for subdural evacuation. Subdural drain extends through the right frontal burr hole with tip overlying the inferior right frontal convexity. Scattered foci of extra-axial pneumocephalus seen bilaterally, left greater than right. Since previous exam, the right subdural collection is slightly decreased in size now measuring 15 mm in maximal thickness, previously 18 mm. The left subdural collection is relatively similar measuring 15 mm in maximal thickness. Degree of hyperdense blood products within both of these collections overall relatively similar. Associated trace left-to-right shift relatively unchanged. Stable ventricular size and morphology. No hydrocephalus or ventricular trapping. Basilar cisterns remain patent. Underlying atrophy with chronic small vessel ischemic disease again noted. No other new acute intracranial hemorrhage. No acute large vessel territory infarct. Vascular: No hyperdense vessel. Scattered vascular calcifications noted within the carotid siphons. Skull: Postoperative changes from prior bifrontal burr hole craniotomy. Skin staples remain in place. Sinuses/Orbits: Globes and orbital soft tissues demonstrate no acute finding. Visible paranasal sinuses and mastoid air cells are clear. Other: None. IMPRESSION: 1.  Postoperative changes from interval bilateral frontal burr hole craniotomy for subdural evacuation. Right-sided subdural drain in place with tip overlying the anterior right frontal convexity. Mixed attenuation bilateral subdural hematomas slightly decreased in size on the right, and relatively similar in size on the left. No significant midline shift or other complication. 2. No other new acute intracranial abnormality. Electronically Signed   By: Jeannine Boga M.D.   On: 07/24/2018 06:26     Medications:  Scheduled: . Chlorhexidine Gluconate Cloth  6 each Topical Q0600  . docusate sodium  100 mg Oral BID  . donepezil  10 mg Oral QHS  . insulin aspart  0-5 Units Subcutaneous QHS  . insulin aspart  0-9 Units Subcutaneous TID WC  . levothyroxine  50 mcg Oral QAC breakfast  . memantine  10 mg Oral BID  . pantoprazole  40 mg Oral Daily  . polyethylene glycol  17 g Oral Daily  . senna  1 tablet Oral BID  .  sevelamer carbonate  800 mg Oral Q supper  . simvastatin  20 mg Oral QHS  . sodium chloride flush  3 mL Intravenous Q12H  . sodium chloride flush  3 mL Intravenous Q12H  . vitamin B-12  1,000 mcg Oral Daily   Continuous: . sodium chloride    . sodium chloride Stopped (07/23/18 1326)  . dextrose 25 mL/hr at 07/24/18 0700  . 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  Patient underwent burr hole on 10/29.  Patient accidentally pulled out 1 of his drain overnight.  The other one was taken out this morning.  Further management per neurosurgery.  He remains on Keppra.  Mental status appears to have improved.    Multiple rib fractures Incentive  spirometry.  Pain control.  End-stage renal disease on dialysis on Monday Wednesday Friday Nephrology is following.  Defer to them.  History of dementia Patient on Aricept and Namenda at home.  Seen by speech therapy.  On regular diet.  Hypothyroidism Continue home medications.  Diabetes mellitus type 2 HbA1c 5.2.  Continue SSI.  Anemia of chronic disease Hemoglobin has been stable.  No evidence of overt bleeding.  DVT Prophylaxis: SCDs    Code Status: Full code Family Communication: No family at bedside Disposition Plan: Management as outlined above.  PT and OT evaluation.  Further management per neurosurgery.    LOS: 3 days   [facility] Pager [phone] 07/24/2018, 12:15 PM  If 7PM-7AM, please contact night-coverage at [email or web address], password [facility]

## 2018-07-24 NOTE — NC FL2 (Signed)
[place] LEVEL OF CARE SCREENING TOOL     IDENTIFICATION  Patient Name: [name] Birthdate: Oct 26, 1940 Sex: male Admission Date (Current Location): 07/20/2018  [facility] and [place] Number:  Herbalist and Address:  [facility], [place] [address] [place], [place] [address]      Provider Number: [id number]  Attending Physician Name and Address:  Bonnielee Haff, MD  Relative Name and Phone Number:  [name] [phone]    Current Level of Care: Hospital Recommended Level of Care: [place] Prior Approval Number:    Date Approved/Denied:   PASRR Number: [id number]  Discharge Plan: SNF    Current Diagnoses: Patient Active Problem List   Diagnosis Date Noted  . 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 Multiple fractures of ribs, left side, initial encounter for closed fracture 07/21/2018  . Subdural hematoma ([place]) 07/21/2018  . Angiodysplasia of stomach   . Occult GI bleeding   . GIB (gastrointestinal bleeding) 06/01/2018  . Low back pain 01/17/2018  . Anorexia   . Dyspnea   . Macrocytic anemia 06/04/2016  . Thrombocytopenia ([place]) 06/04/2016  . Exertional dyspnea 06/04/2016  . Arm paresthesia, left 06/04/2016  . Dyspnea on exertion 06/04/2016  . Tenderness of right calf 06/04/2016  . Mild cognitive impairment with memory loss 01/12/2016  . ESRD on dialysis ([place]) 05/17/2015  . Hypoglycemia   . Weakness 02/18/2015  . OSA (obstructive sleep apnea) 02/02/2015  . Hyperkalemia 10/05/2014  . Chronic kidney disease (CKD), stage IV (severe) ([place]) 04/07/2014  . End stage renal disease ([place]) 11/18/2013  . BPH (benign prostatic hyperplasia) 11/22/2012  . Peripheral vascular disease ([place]) 12/24/2011  . Hyperparathyroidism ([place]) 12/24/2011  . Hypothyroidism 12/24/2011  . Allergic rhinitis 12/24/2011  . Erectile dysfunction 12/24/2011  . Insulin dependent diabetes mellitus  ([place]) 09/03/2008  . Hyperlipidemia 09/03/2008  . Essential hypertension 08/30/2008  . PANCREATITIS, HX OF 08/30/2008  . RENAL FAILURE, ACUTE, HX OF 08/30/2008  . DIVERTICULOSIS, COLON 06/08/2003    Orientation RESPIRATION BLADDER Height & Weight     Self  Normal Continent Weight: 149 lb 0.5 oz (67.6 kg) Height:  5\' 11"  (180.3 cm)  BEHAVIORAL SYMPTOMS/MOOD NEUROLOGICAL BOWEL NUTRITION STATUS      Continent Diet(Please see discharge Summary)  AMBULATORY STATUS COMMUNICATION OF NEEDS Skin     Verbally Other (Comment)(closed incision, head, bilateral)                       Personal Care Assistance Level of Assistance  Bathing, Feeding, Dressing Bathing Assistance: Limited assistance Feeding assistance: Independent Dressing Assistance: Limited assistance     Functional Limitations Info  Sight, Hearing, Speech Sight Info: Adequate Hearing Info: Adequate Speech Info: Adequate    SPECIAL CARE FACTORS FREQUENCY  PT (By licensed PT), OT (By licensed OT)     PT Frequency: 5x per wk OT Frequency: 5xper wk            Contractures      Additional Factors Info  Code Status, Allergies(Dialysis  M,W,F at [facility] in [place]) Code Status Info: FULL Allergies Info: Penicillins           Current Medications (07/24/2018):  This is the current hospital active medication list Current Facility-Administered Medications  Medication Dose Route Frequency Provider Last Rate Last Dose  . 0.9 %  sodium chloride infusion  250 mL Intravenous PRN Kristeen Miss, MD      .  0.9 %  sodium chloride infusion   Intravenous Continuous Kristeen Miss, MD   Stopped at 07/23/18 1326  . acetaminophen (TYLENOL) tablet 650 mg  650 mg Oral Q6H PRN Kristeen Miss, MD   650 mg at 07/23/18 1946   Or  . acetaminophen (TYLENOL) suppository 650 mg  650 mg Rectal Q6H PRN Kristeen Miss, MD      . bisacodyl (DULCOLAX) suppository 10 mg  10 mg Rectal Daily PRN Kristeen Miss, MD      .  Chlorhexidine Gluconate Cloth 2 % PADS 6 each  6 each Topical Q0600 Edrick Oh, MD      . dextrose 10 % infusion   Intravenous Continuous Kristeen Miss, MD 25 mL/hr at 07/24/18 0700    . docusate sodium (COLACE) capsule 100 mg  100 mg Oral BID Kristeen Miss, MD   100 mg at 07/24/18 0948  . donepezil (ARICEPT) tablet 10 mg  10 mg Oral Antoine Poche, MD   10 mg at 07/23/18 2119  . fentaNYL (SUBLIMAZE) injection 12.5-25 mcg  12.5-25 mcg Intravenous Q2H PRN Kristeen Miss, MD   25 mcg at 07/23/18 1347  . insulin aspart (novoLOG) injection 0-5 Units  0-5 Units Subcutaneous QHS Kristeen Miss, MD      . insulin aspart (novoLOG) injection 0-9 Units  0-9 Units Subcutaneous TID WC Kristeen Miss, MD      . labetalol (NORMODYNE,TRANDATE) injection 10-40 mg  10-40 mg Intravenous Q10 min PRN Kristeen Miss, MD   20 mg at 07/22/18 2241  . levETIRAcetam (KEPPRA) IVPB 500 mg/100 mL premix  500 mg Intravenous Austin Miles, MD 400 mL/hr at 07/24/18 0741 500 mg at 07/24/18 0741  . levothyroxine (SYNTHROID, LEVOTHROID) tablet 50 mcg  50 mcg Oral QAC breakfast Kristeen Miss, MD   50 mcg at 07/24/18 0948  . memantine (NAMENDA) tablet 10 mg  10 mg Oral BID Kristeen Miss, MD   10 mg at 07/24/18 0949  . ondansetron (ZOFRAN) tablet 4 mg  4 mg Oral Q4H PRN Kristeen Miss, MD       Or  . ondansetron (ZOFRAN) injection 4 mg  4 mg Intravenous Q4H PRN Kristeen Miss, MD      . pantoprazole (PROTONIX) EC tablet 40 mg  40 mg Oral Daily Kristeen Miss, MD   40 mg at 07/24/18 0948  . polyethylene glycol (MIRALAX / GLYCOLAX) packet 17 g  17 g Oral Daily Elsner, Mallie Mussel, MD      . polyethylene glycol (MIRALAX / GLYCOLAX) packet 17 g  17 g Oral Daily PRN Kristeen Miss, MD      . polyvinyl alcohol (LIQUIFILM TEARS) 1.4 % ophthalmic solution 1 drop  1 drop Both Eyes PRN Kristeen Miss, MD      . promethazine (PHENERGAN) tablet 12.5-25 mg  12.5-25 mg Oral Q4H PRN Kristeen Miss, MD      . senna (SENOKOT) tablet 8.6 mg  1 tablet Oral  BID Kristeen Miss, MD   8.6 mg at 07/23/18 2118  . sevelamer carbonate (RENVELA) tablet 800 mg  800 mg Oral Q supper Kristeen Miss, MD   800 mg at 07/23/18 1754  . simvastatin (ZOCOR) tablet 20 mg  20 mg Oral Antoine Poche, MD   20 mg at 07/23/18 2126  . sodium chloride flush (NS) 0.9 % injection 3 mL  3 mL Intravenous Q12H Kristeen Miss, MD   3 mL at 07/23/18 1331  . sodium chloride flush (NS) 0.9 % injection 3 mL  3 mL Intravenous  Austin Miles, MD   3 mL at 07/23/18 1331  . sodium chloride flush (NS) 0.9 % injection 3 mL  3 mL Intravenous PRN Kristeen Miss, MD      . sodium phosphate (FLEET) 7-19 GM/118ML enema 1 enema  1 enema Rectal Once PRN Kristeen Miss, MD      . vitamin B-12 (CYANOCOBALAMIN) tablet 1,000 mcg  1,000 mcg Oral Daily Kristeen Miss, MD   1,000 mcg at 07/24/18 1460     Discharge Medications: Please see discharge summary for a list of discharge medications.  Relevant Imaging Results:  Relevant Lab Results:   Additional Information SSN# [id number]     Vinie Sill, LCSWA

## 2018-07-24 NOTE — Clinical Social Work Note (Signed)
Clinical Social Work Assessment  Patient Details  Name: [name] MRN: [id number] Date of Birth: 1940-10-17  Date of referral:  07/24/18               Reason for consult:  Facility Placement                Permission sought to share information with:  Family Supports, Customer service manager Permission granted to share information::  Yes, Verbal Permission Granted  Name::     [name]   Agency::  SNFs  Relationship::  spouse  Contact Information:  [phone]  Housing/Transportation Living arrangements for the past 2 months:  [place] of Information:  Spouse Patient Interpreter Needed:  None Criminal Activity/Legal Involvement Pertinent to Current Situation/Hospitalization:  No - Comment as needed Significant Relationships:  Spouse Lives with:  Spouse Do you feel safe going back to the place where you live?  No Need for family participation in patient care:  Yes (Comment)  Care giving concerns: CSW received consult for discharge needs. CSW spoke with patient's wife  regarding PT recommendation of SNF placement at time of discharge. Patient's spouse reports she has problems with her back and she is unable to provide the level of care needed at this time.     Social Worker assessment / plan:  CSW spoke with patient's spouse concerning possibility of rehab at [facility] before returning home. She agrees with discharge plan. She would like to see the patient get stronger and improve his mobility.    Employment status:  Retired Forensic scientist:  Biomedical scientist for [employer] ) PT Recommendations:  [place] / Referral to community resources:  [place]  Patient/Family's Response to care:  Patient recognizes need for rehab before returning home and is agreeable to a SNF placement. Patient's spouse reported preference for [facility] or [facility]  in [place] ( although she prefers him closer).     Patient/Family's Understanding of and Emotional Response to Diagnosis, Current Treatment, and Prognosis:  Patient's spouse is realistic regarding therapy needs and expressed being hopeful for SNF placement, which includes dialysis transportation. She states patient receives dialysis 3x per week, M,W,F at 7am at the [facility].She expressed understanding of CSW role and discharge process as well as medical condition. No questions/concerns about plan or treatment.    Emotional Assessment Appearance:  Appears stated age Attitude/Demeanor/Rapport:  Unable to Assess(PT working with patient ) Affect (typically observed):  Unable to Assess Orientation:  Oriented to Self Alcohol / Substance use:  Not Applicable Psych involvement (Current and /or in the community):  No (Comment)  Discharge Needs  Concerns to be addressed:  Care Coordination, Basic Needs Readmission within the last 30 days:  No Current discharge risk:  Dependent with Mobility Barriers to Discharge:  Continued Medical Work up   [employer], IXL 07/24/2018, 11:45 AM

## 2018-07-24 NOTE — Progress Notes (Signed)
[facility] ROUNDING NOTE   Subjective:   Awake and alert this morning.  Patient seen on dialysis.  Appears to be doing well status post Burr Holes day 2 for chronic bilateral subdural hemorrhage appreciate help from Dr. Ellene Route drains are now out.  Blood pressure 143/68 pulse 71 temperature 98.3 O2 sats 98% room air  Labs 07/23/2018 sodium 136 potassium 4.0 chloride 99 CO2 25 glucose 122 creatinine 6.01 BUN 19 calcium 9.8 phosphorus 7.1 albumin 2.7 hemoglobin 10.8 WBC 8.2 platelets 180   Objective:  Vital signs in last 24 hours:  Temp:  [97.5 F (36.4 C)-98.3 F (36.8 C)] 98.3 F (36.8 C) (10/31 0800) Pulse Rate:  [61-86] 71 (10/31 1000) Resp:  [11-22] 12 (10/31 0900) BP: (74-148)/(37-113) 143/68 (10/31 1000) SpO2:  [94 %-100 %] 98 % (10/31 1000) Weight:  [67.6 kg] 67.6 kg (10/30 1221)  Weight change:  Filed Weights   07/21/18 1330 07/23/18 0815 07/23/18 1221  Weight: 69 kg 69.9 kg 67.6 kg    Intake/Output: I/O last 3 completed shifts: In: 896 [I.V.:661.6; IV Piggyback:234.4] Out: 2029 [Drains:278; Other:1751]   Intake/Output this shift:  Total I/O In: 528.8 [P.O.:360; I.V.:68.8; IV Piggyback:100] Out: -  Distressed alert and oriented in all extremities CVS- RRR no murmurs rubs or gallops JVP not elevated RS- CTA ABD- BS present soft non-distended EXT- no edema AV fistula right thrill and bruit Neurology follows commands moving all extremities   Basic Metabolic Panel: Recent Labs  Lab 07/21/18 0129 07/21/18 0151 07/21/18 1026 07/22/18 1630 07/23/18 0839  NA 136 134* 138 135 136  K 3.7 3.6 3.8 3.5 4.0  CL 96* 95* 94*  --  99  CO2 31  --  29  --  25  GLUCOSE 107* 101* 95 94 122*  BUN 31* 37* 34*  --  19  CREATININE 7.36* 8.10* 8.07*  --  6.01*  CALCIUM 11.2*  --  11.1*  --  9.8  PHOS  --   --  6.4*  --  7.1*    Liver Function Tests: Recent Labs  Lab 07/21/18 0129 07/21/18 1026 07/23/18 0839  AST 35  --   --   ALT 13  --   --    ALKPHOS 84  --   --   BILITOT 0.8  --   --   PROT 5.1*  --   --   ALBUMIN 2.5* 2.5* 2.7*   No results for input(s): LIPASE, AMYLASE in the last 168 hours. No results for input(s): AMMONIA in the last 168 hours.  CBC: Recent Labs  Lab 07/21/18 0129 07/21/18 0151 07/21/18 1026 07/22/18 1630 07/23/18 0839  WBC 8.6  --  7.7  --  8.2  NEUTROABS 5.3  --   --   --   --   HGB 10.6* 11.9* 10.9* 12.9* 10.8*  HCT 32.7* 35.0* 34.7* 38.0* 32.8*  MCV 100.0  --  100.0  --  98.5  PLT 248  --  264  --  180    Cardiac Enzymes: No results for input(s): CKTOTAL, CKMB, CKMBINDEX, TROPONINI in the last 168 hours.  BNP: Invalid input(s): POCBNP  CBG: Recent Labs  Lab 07/23/18 1333 07/23/18 1408 07/23/18 1529 07/23/18 2011 07/24/18 0832  GLUCAP 69* 141* 112* 173* 108*    Microbiology: Results for orders placed or performed during the hospital encounter of 07/20/18  Surgical PCR screen     Status: None   Collection Time: 07/22/18 12:00 AM  Result Value Ref Range Status  MRSA, PCR NEGATIVE NEGATIVE Final   Staphylococcus aureus NEGATIVE NEGATIVE Final    Comment: (NOTE) The Xpert SA Assay (FDA approved for NASAL specimens in patients 22 years of age and older), is one component of a comprehensive surveillance program. It is not intended to diagnose infection nor to guide or monitor treatment. Performed at [facility], [place] [address] [place], [place] [address]     Coagulation Studies: Recent Labs    07/22/18 0612  LABPROT 12.8  INR 0.97    Urinalysis: Recent Labs    07/21/18 1044  COLORURINE YELLOW  LABSPEC 1.009  PHURINE 9.0*  GLUCOSEU NEGATIVE  HGBUR NEGATIVE  BILIRUBINUR NEGATIVE  KETONESUR NEGATIVE  PROTEINUR 100*  NITRITE NEGATIVE  LEUKOCYTESUR SMALL*      Imaging: Ct Head Wo Contrast  Result Date: 07/24/2018 CLINICAL DATA:  Follow-up CT for bilateral subdural hematomas, status post evacuation. EXAM: CT HEAD WITHOUT CONTRAST TECHNIQUE:  Contiguous axial images were obtained from the base of the skull through the vertex without intravenous contrast. COMPARISON:  Prior CT from 07/20/2018. FINDINGS: Brain: Fairly sizable mixed attenuation bilateral subdural hematomas again seen. There has been performance of bilateral frontal burr hole craniotomy for subdural evacuation. Subdural drain extends through the right frontal burr hole with tip overlying the inferior right frontal convexity. Scattered foci of extra-axial pneumocephalus seen bilaterally, left greater than right. Since previous exam, the right subdural collection is slightly decreased in size now measuring 15 mm in maximal thickness, previously 18 mm. The left subdural collection is relatively similar measuring 15 mm in maximal thickness. Degree of hyperdense blood products within both of these collections overall relatively similar. Associated trace left-to-right shift relatively unchanged. Stable ventricular size and morphology. No hydrocephalus or ventricular trapping. Basilar cisterns remain patent. Underlying atrophy with chronic small vessel ischemic disease again noted. No other new acute intracranial hemorrhage. No acute large vessel territory infarct. Vascular: No hyperdense vessel. Scattered vascular calcifications noted within the carotid siphons. Skull: Postoperative changes from prior bifrontal burr hole craniotomy. Skin staples remain in place. Sinuses/Orbits: Globes and orbital soft tissues demonstrate no acute finding. Visible paranasal sinuses and mastoid air cells are clear. Other: None. IMPRESSION: 1. Postoperative changes from interval bilateral frontal burr hole craniotomy for subdural evacuation. Right-sided subdural drain in place with tip overlying the anterior right frontal convexity. Mixed attenuation bilateral subdural hematomas slightly decreased in size on the right, and relatively similar in size on the left. No significant midline shift or other complication. 2.  No other new acute intracranial abnormality. Electronically Signed   By: Jeannine Boga M.D.   On: 07/24/2018 06:26     Medications:   . sodium chloride    . sodium chloride Stopped (07/23/18 1326)  . dextrose 25 mL/hr at 07/24/18 0700  . levETIRAcetam 500 mg (07/24/18 0741)   . docusate sodium  100 mg Oral BID  . donepezil  10 mg Oral QHS  . insulin aspart  0-5 Units Subcutaneous QHS  . insulin aspart  0-9 Units Subcutaneous TID WC  . levothyroxine  50 mcg Oral QAC breakfast  . memantine  10 mg Oral BID  . pantoprazole  40 mg Oral Daily  . polyethylene glycol  17 g Oral Daily  . senna  1 tablet Oral BID  . sevelamer carbonate  800 mg Oral Q supper  . simvastatin  20 mg Oral QHS  . sodium chloride flush  3 mL Intravenous Q12H  . sodium chloride flush  3 mL Intravenous Q12H  .  vitamin B-12  1,000 mcg Oral Daily   sodium chloride, acetaminophen **OR** acetaminophen, bisacodyl, fentaNYL (SUBLIMAZE) injection, labetalol, ondansetron **OR** ondansetron (ZOFRAN) IV, polyethylene glycol, polyvinyl alcohol, promethazine, sodium chloride flush, sodium phosphate  Assessment/ Plan:  Dialysis Orders: MWF [facility] 4 hrs 180 NRe 400/Autoflow 1.5 71 kg 2.0K/2.0 Ca  R AVF -Heparin 1800 units IV -Mircera 150 mcg IV q 2 weeks (last dose 07/16/18 Last HGB 11.4 07/16/18)  Assessment/Plan: 1.  Bilateral acute on chronic subdural hematoma-status post bur hole placement day #2 drains removed 2.  Multiple rib fractures-per primary. Does not seem to be painful.  3.  ESRD -  MWF  . K+ 4.7 no heparin dialysis per schedule dialysis 07/25/2018 4.  Hypertension/volume  - Hypertensive at present. Avoid hypotension while on HD. Pre wt 68.9 kg-has been leaving under EDW at OP center. UFG 1.3 liters removed 07/21/2018. 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  5.  Anemia  -hemoglobin 10.9 6.  Metabolic bone disease -  Ca 8.7. NPO at present. Continue binders when able to eat.   Phosphorus 6.4 Albumin  2.5 7.  Nutrition -NPO  8.  DM-per primary 9.   Hypothyroidism-per primary 10  Dementia-per primary    LOS: [place] @TODAY @10 :31 AM

## 2018-07-24 NOTE — Progress Notes (Signed)
Patient ID: [name], male   DOB: [birth year], 77 y.o.   MRN: [id number] Vital signs are stable Patient is arousable follows commands but confused as to time place and situation. CT scan shows right subdural hematomas reduced by about 4 mm in thickness left subdural is about the same but there is considerable intracranial air.  The patient removed his left-sided drain himself overnight. I have removed the right-sided drain today.  Singular staple was placed at the drainage site and new dressings were applied to both sites.  We will observe his neurologic status over the next few days.  Monitor in intensive care unit today.

## 2018-07-24 NOTE — Progress Notes (Signed)
Physical Therapy Treatment Patient Details Name: [name] MRN: [id number] DOB: [birth year] Today's Date: 07/24/2018    History of Present Illness 77 y.o. male with medical history significant for type 2 diabetes mellitus, hypothyroidism, dementia, and end-stage renal disease on hemodialysis, presented with increased confusion and history of falls at home.   Imaging revealed Bil SDH with modest mass-effect causing concave deformity, multi rib fx on left.. S/p burr holes 10/29    PT Comments    Pt sleeping with head on rail on arrival, easily awoken and able to increase gait distance today. Pt with improved balance, following commands and decreased assist for transfers. With increased mobility today pt could benefit from CIR to return home with wife. Pt educated for bil LE HEPand increased mobility with nursing.    Follow Up Recommendations  CIR;Supervision/Assistance - 24 hour     Equipment Recommendations  Rolling walker with 5" wheels    Recommendations for Other Services       Precautions / Restrictions Precautions Precautions: Fall    Mobility  Bed Mobility Overal bed mobility: Needs Assistance Bed Mobility: Supine to Sit     Supine to sit: Min assist     General bed mobility comments: min assist to move legs to EOB and elevate trunk  Transfers Overall transfer level: Needs assistance   Transfers: Sit to/from Stand Sit to Stand: Min assist;Min guard         General transfer comment: min assist to rise from bed, minguard to rise from toilet with rail, increased trials to initiate standing on each attempt   Ambulation/Gait   Gait Distance (Feet): 120 Feet Assistive device: Rolling walker (2 wheeled) Gait Pattern/deviations: Step-through pattern;Decreased stride length   Gait velocity interpretation: <1.8 ft/sec, indicate of risk for recurrent falls General Gait Details: cues for posture, position in RW, 3 standing rest breaks, cues for direction to room.  Pt then walked 2x 15' after hall ambulation to bathroom   Stairs             Wheelchair Mobility    Modified Rankin (Stroke Patients Only) Modified Rankin (Stroke Patients Only) Pre-Morbid Rankin Score: Slight disability Modified Rankin: Moderately severe disability     Balance Overall balance assessment: History of Falls;Needs assistance   Sitting balance-Leahy Scale: Fair     Standing balance support: Bilateral upper extremity supported;During functional activity Standing balance-Leahy Scale: Poor                              Cognition Arousal/Alertness: Awake/alert Behavior During Therapy: Flat affect Overall Cognitive Status: No family/caregiver present to determine baseline cognitive functioning                     Current Attention Level: Selective Memory: Decreased short-term memory Following Commands: Follows one step commands consistently Safety/Judgement: Decreased awareness of deficits   Problem Solving: Slow processing General Comments: pt oriented to place, day, situation and responding appropriately throughout session      Exercises General Exercises - Lower Extremity Long Arc Quad: AROM;15 reps;Seated;Both Hip Flexion/Marching: AROM;15 reps;Seated;Both    General Comments        Pertinent Vitals/Pain Pain Assessment: No/denies pain    Home Living                      Prior Function            PT Goals (current goals can now  be found in the care plan section) Progress towards PT goals: Progressing toward goals    Frequency    Min 3X/week      PT Plan Discharge plan needs to be updated;Frequency needs to be updated    Co-evaluation              AM-PAC PT "6 Clicks" Daily Activity  Outcome Measure  Difficulty turning over in bed (including adjusting bedclothes, sheets and blankets)?: Unable Difficulty moving from lying on back to sitting on the side of the bed? : Unable Difficulty sitting  down on and standing up from a chair with arms (e.g., wheelchair, bedside commode, etc,.)?: Unable Help needed moving to and from a bed to chair (including a wheelchair)?: A Little Help needed walking in hospital room?: A Little Help needed climbing 3-5 steps with a railing? : A Lot 6 Click Score: 11    End of Session Equipment Utilized During Treatment: Gait belt Activity Tolerance: Patient tolerated treatment well Patient left: in chair;with call bell/phone within reach;with chair alarm set Nurse Communication: Mobility status PT Visit Diagnosis: Muscle weakness (generalized) (M62.81);Repeated falls (R29.6);Other abnormalities of gait and mobility (R26.89)     Time: 8115-7262 PT Time Calculation (min) (ACUTE ONLY): 28 min  Charges:  $Gait Training: 8-22 mins $Therapeutic Exercise: 8-22 mins                     [place], PT Acute Rehabilitation Services Pager: [phone] Office: [place] 07/24/2018, 12:13 PM

## 2018-07-25 DIAGNOSIS — N186 End stage renal disease: Secondary | ICD-10-CM | POA: Diagnosis not present

## 2018-07-25 DIAGNOSIS — Z992 Dependence on renal dialysis: Secondary | ICD-10-CM | POA: Diagnosis not present

## 2018-07-25 DIAGNOSIS — E1122 Type 2 diabetes mellitus with diabetic chronic kidney disease: Secondary | ICD-10-CM | POA: Diagnosis not present

## 2018-07-25 LAB — GLUCOSE, CAPILLARY
Glucose-Capillary: 91 mg/dL (ref 70–99)
Glucose-Capillary: 120 mg/dL — ABNORMAL HIGH (ref 70–99)
Glucose-Capillary: 186 mg/dL — ABNORMAL HIGH (ref 70–99)
Glucose-Capillary: 83 mg/dL (ref 70–99)

## 2018-07-25 LAB — BASIC METABOLIC PANEL
Anion gap: 8 (ref 5–15)
BUN: 21 mg/dL (ref 8–23)
CO2: 27 mmol/L (ref 22–32)
Calcium: 10.3 mg/dL (ref 8.9–10.3)
Chloride: 99 mmol/L (ref 98–111)
Creatinine, Ser: 5.48 mg/dL — ABNORMAL HIGH (ref 0.61–1.24)
GFR calc Af Amer: 11 mL/min — ABNORMAL LOW (ref >60)
GFR calc non Af Amer: 9 mL/min — ABNORMAL LOW (ref >60)
Glucose, Bld: 90 mg/dL (ref 70–99)
Potassium: 3.4 mmol/L — ABNORMAL LOW (ref 3.5–5.1)
Sodium: 134 mmol/L — ABNORMAL LOW (ref 135–145)

## 2018-07-25 LAB — CBC
HCT: 35.3 % — ABNORMAL LOW (ref 39.0–52.0)
Hemoglobin: 11 g/dL — ABNORMAL LOW (ref 13.0–17.0)
MCH: 31.1 pg (ref 26.0–34.0)
MCHC: 31.2 g/dL (ref 30.0–36.0)
MCV: 99.7 fL (ref 80.0–100.0)
Platelets: 167 10*3/uL (ref 150–400)
RBC: 3.54 MIL/uL — ABNORMAL LOW (ref 4.22–5.81)
RDW: 15.7 % — ABNORMAL HIGH (ref 11.5–15.5)
WBC: 7.7 10*3/uL (ref 4.0–10.5)
nRBC: 0.3 % — ABNORMAL HIGH (ref 0.0–0.2)

## 2018-07-25 NOTE — Progress Notes (Signed)
Rehab Admissions Coordinator Note:  Patient was screened by Cleatrice Burke for appropriateness for an Inpatient Acute Rehab Consult per PT recommendation..  At this time, we are recommending Inpatient Rehab consult.  Cleatrice Burke 07/25/2018, 12:54 PM  I can be reached at [phone].

## 2018-07-25 NOTE — Progress Notes (Signed)
[facility] PROGRESS NOTE  [name] IBB:[id number] DOB: Sep 26, 1940 DOA: 07/20/2018  PCP: Elby Showers, MD  Brief History/Interval Summary: 77 y.o.malewith medical history significant fortype 2 diabetes mellitus, hypothyroidism, dementia, and end-stage renal disease on hemodialysis, presented to the emergency department with increased confusion. Patient was admitted last month with GI bleeding and generalized weakness, was transfused, but has continued to experience generalized weakness and his wife has noted some difficulty with gait and reports that he has fallen multiple times over the last few months.  Evaluation was concerning for acute on chronic subdural hematoma.  Patient was hospitalized for further management.  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  Nephrology.  Procedures: Bilateral burr holes for drainage of subdural hematomas  Antibiotics: None  Subjective/Interval History: Patient seen bilateral dialysis.  He denies any complaints.  Denies any headaches specifically.     Objective:  Vital Signs  Vitals:   07/25/18 0830 07/25/18 0900 07/25/18 0930 07/25/18 1000  BP: (!) 197/92 (!) 91/42 100/66 105/69  Pulse: 67 (!) 51 78 78  Resp: 16 17 18    Temp:      TempSrc:      SpO2:      Weight:      Height:        Intake/Output Summary (Last 24 hours) at 07/25/2018 1226 Last data filed at 07/25/2018 0800 Gross per 24 hour  Intake 205.53 ml  Output -  Net 205.53 ml   Filed Weights   07/23/18 0815 07/23/18 1221 07/25/18 0805  Weight: 69.9 kg 67.6 kg 69.4 kg    General appearance: Awake alert.  Mildly distracted.  No distress. Dressing noted over his scalp Resp: Normal effort at rest.  Clear to auscultation bilaterally. Cardio: S1-S2 is normal regular.  No S3-S4.  No rubs murmurs or bruit GI: Abdomen soft.  Nontender nondistended.  Bowel sounds are present normal.  No masses  organomegaly Neurologic: No obvious focal neurological deficits.  Lab Results:  Data Reviewed: I have personally reviewed following labs and imaging studies  CBC: Recent Labs  Lab 07/21/18 0129 07/21/18 0151 07/21/18 1026 07/22/18 1630 07/23/18 0839 07/25/18 0623  WBC 8.6  --  7.7  --  8.2 7.7  NEUTROABS 5.3  --   --   --   --   --   HGB 10.6* 11.9* 10.9* 12.9* 10.8* 11.0*  HCT 32.7* 35.0* 34.7* 38.0* 32.8* 35.3*  MCV 100.0  --  100.0  --  98.5 99.7  PLT 248  --  264  --  180 450    Basic Metabolic Panel: Recent Labs  Lab 07/21/18 0129 07/21/18 0151 07/21/18 1026 07/22/18 1630 07/23/18 0839 07/25/18 0623  NA 136 134* 138 135 136 134*  K 3.7 3.6 3.8 3.5 4.0 3.4*  CL 96* 95* 94*  --  99 99  CO2 31  --  29  --  25 27  GLUCOSE 107* 101* 95 94 122* 90  BUN 31* 37* 34*  --  19 21  CREATININE 7.36* 8.10* 8.07*  --  6.01* 5.48*  CALCIUM 11.2*  --  11.1*  --  9.8 10.3  PHOS  --   --  6.4*  --  7.1*  --     GFR: Estimated Creatinine Clearance: 11.3 mL/min (A) (by C-G formula based on SCr of 5.48 mg/dL (H)).  Liver Function Tests: Recent Labs  Lab 07/21/18 0129 07/21/18 1026 07/23/18 0839  AST 35  --   --  ALT 13  --   --   ALKPHOS 84  --   --   BILITOT 0.8  --   --   PROT 5.1*  --   --   ALBUMIN 2.5* 2.5* 2.7*    Coagulation Profile: Recent Labs  Lab 07/22/18 0612  INR 0.97    CBG: Recent Labs  Lab 07/24/18 0832 07/24/18 1200 07/24/18 1742 07/24/18 2130 07/25/18 0738  GLUCAP 108* 106* 111* 154* 91     Recent Results (from the past 240 hour(s))  Surgical PCR screen     Status: None   Collection Time: 07/22/18 12:00 AM  Result Value Ref Range Status   MRSA, PCR NEGATIVE NEGATIVE Final   Staphylococcus aureus NEGATIVE NEGATIVE Final    Comment: (NOTE) The Xpert SA Assay (FDA approved for NASAL specimens in patients 11 years of age and older), is one component of a comprehensive surveillance program. It is not intended to diagnose infection  nor to guide or monitor treatment. Performed at [facility], [place] [address] [place], [place] [address]       Radiology Studies: Ct Head Wo Contrast  Result Date: 07/24/2018 CLINICAL DATA:  Follow-up CT for bilateral subdural hematomas, status post evacuation. EXAM: CT HEAD WITHOUT CONTRAST TECHNIQUE: Contiguous axial images were obtained from the base of the skull through the vertex without intravenous contrast. COMPARISON:  Prior CT from 07/20/2018. FINDINGS: Brain: Fairly sizable mixed attenuation bilateral subdural hematomas again seen. There has been performance of bilateral frontal burr hole craniotomy for subdural evacuation. Subdural drain extends through the right frontal burr hole with tip overlying the inferior right frontal convexity. Scattered foci of extra-axial pneumocephalus seen bilaterally, left greater than right. Since previous exam, the right subdural collection is slightly decreased in size now measuring 15 mm in maximal thickness, previously 18 mm. The left subdural collection is relatively similar measuring 15 mm in maximal thickness. Degree of hyperdense blood products within both of these collections overall relatively similar. Associated trace left-to-right shift relatively unchanged. Stable ventricular size and morphology. No hydrocephalus or ventricular trapping. Basilar cisterns remain patent. Underlying atrophy with chronic small vessel ischemic disease again noted. No other new acute intracranial hemorrhage. No acute large vessel territory infarct. Vascular: No hyperdense vessel. Scattered vascular calcifications noted within the carotid siphons. Skull: Postoperative changes from prior bifrontal burr hole craniotomy. Skin staples remain in place. Sinuses/Orbits: Globes and orbital soft tissues demonstrate no acute finding. Visible paranasal sinuses and mastoid air cells are clear. Other: None. IMPRESSION: 1. Postoperative changes from interval bilateral frontal burr  hole craniotomy for subdural evacuation. Right-sided subdural drain in place with tip overlying the anterior right frontal convexity. Mixed attenuation bilateral subdural hematomas slightly decreased in size on the right, and relatively similar in size on the left. No significant midline shift or other complication. 2. No other new acute intracranial abnormality. Electronically Signed   By: Jeannine Boga M.D.   On: 07/24/2018 06:26     Medications:  Scheduled: . Chlorhexidine Gluconate Cloth  6 each Topical Q0600  . docusate sodium  100 mg Oral BID  . donepezil  10 mg Oral QHS  . insulin aspart  0-5 Units Subcutaneous QHS  . insulin aspart  0-9 Units Subcutaneous TID WC  . levothyroxine  50 mcg Oral QAC breakfast  . memantine  10 mg Oral BID  . pantoprazole  40 mg Oral Daily  . polyethylene glycol  17 g Oral Daily  . senna  1 tablet Oral BID  .  sevelamer carbonate  800 mg Oral Q supper  . simvastatin  20 mg Oral QHS  . sodium chloride flush  3 mL Intravenous Q12H  . sodium chloride flush  3 mL Intravenous Q12H  . vitamin B-12  1,000 mcg Oral Daily   Continuous: . sodium chloride    . sodium chloride Stopped (07/24/18 2025)  . dextrose Stopped (07/24/18 1130)  . levETIRAcetam 500 mg (07/25/18 0738)   OMA:YOKHTX chloride, acetaminophen **OR** acetaminophen, bisacodyl, diphenhydrAMINE, fentaNYL (SUBLIMAZE) injection, labetalol, ondansetron **OR** ondansetron (ZOFRAN) IV, polyethylene glycol, polyvinyl alcohol, promethazine, sodium chloride flush, sodium phosphate   Assessment/Plan:  Acute on chronic bilateral subdural hematoma Neurosurgery is following.  Patient underwent burr hole on 10/29.  Patient accidentally pulled out 1 of his drain overnight on 10/30.  The other one was taken out on 10/31.  Further management per neurosurgery.  Patient remains on Keppra.  Mental status appears to be stable.     Multiple rib fractures Incentive spirometry.  Pain control.  End-stage  renal disease on dialysis on Monday Wednesday Friday Management per nephrology.  He is being dialyzed today.  History of dementia Patient on Aricept and Namenda at home.  Seen by speech therapy.  On regular diet.  Hypothyroidism Continue home medications.  Diabetes mellitus type 2 HbA1c 5.2.  Continue SSI.  Anemia of chronic disease Hemoglobin has been stable.  No evidence of overt bleeding.  DVT Prophylaxis: SCDs    Code Status: Full code Family Communication: No family at bedside Disposition Plan: Await further neurosurgical input.  Physical therapy has recommended skilled nursing facility vs CIR.  Social worker is following.    LOS: 4 days   [facility] Pager [phone] 07/25/2018, 12:26 PM  If 7PM-7AM, please contact night-coverage at [email or web address], password [facility]

## 2018-07-25 NOTE — Progress Notes (Signed)
[facility] ROUNDING NOTE   Subjective:   S/p Bur Holes day 2 for chronic bilateral subdural hemorrhage appreciate help from Dr. Ellene Route drains are now out.  Patient was seen on dialysis 07/25/2018  Blood pressure 126/56 pulse 79 temperature 98.3 O2 sats 100% room air  Sodium 134 potassium 3.4 chloride 99 CO2 27 BUN 21 creatinine 5.48 glucose 90 calcium 10.3 WBC 7.7 hemoglobin 11.0 platelets 167   Objective:  Vital signs in last 24 hours:  Temp:  [97.5 F (36.4 C)-98.4 F (36.9 C)] 98 F (36.7 C) (11/01 1153) Pulse Rate:  [51-133] 79 (11/01 1300) Resp:  [11-22] 14 (11/01 1300) BP: (91-197)/(42-103) 124/56 (11/01 1300) SpO2:  [96 %-100 %] 100 % (11/01 1300) Weight:  [67.5 kg-69.4 kg] 67.5 kg (11/01 1153)  Weight change:  Filed Weights   07/23/18 1221 07/25/18 0805 07/25/18 1153  Weight: 67.6 kg 69.4 kg 67.5 kg    Intake/Output: I/O last 3 completed shifts: In: 990.9 [P.O.:360; I.V.:330.9; IV Piggyback:300] Out: 25 [Drains:25]   Intake/Output this shift:  Total I/O In: 100 [IV Piggyback:100] Out: 2000 [Other:2000] Distressed alert and oriented in all extremities CVS- RRR no murmurs rubs or gallops JVP not elevated RS- CTA ABD- BS present soft non-distended EXT- no edema AV fistula right thrill and bruit Neurology follows commands moving all extremities   Basic Metabolic Panel: Recent Labs  Lab 07/21/18 0129 07/21/18 0151 07/21/18 1026 07/22/18 1630 07/23/18 0839 07/25/18 0623  NA 136 134* 138 135 136 134*  K 3.7 3.6 3.8 3.5 4.0 3.4*  CL 96* 95* 94*  --  99 99  CO2 31  --  29  --  25 27  GLUCOSE 107* 101* 95 94 122* 90  BUN 31* 37* 34*  --  19 21  CREATININE 7.36* 8.10* 8.07*  --  6.01* 5.48*  CALCIUM 11.2*  --  11.1*  --  9.8 10.3  PHOS  --   --  6.4*  --  7.1*  --     Liver Function Tests: Recent Labs  Lab 07/21/18 0129 07/21/18 1026 07/23/18 0839  AST 35  --   --   ALT 13  --   --   ALKPHOS 84  --   --   BILITOT 0.8  --   --    PROT 5.1*  --   --   ALBUMIN 2.5* 2.5* 2.7*   No results for input(s): LIPASE, AMYLASE in the last 168 hours. No results for input(s): AMMONIA in the last 168 hours.  CBC: Recent Labs  Lab 07/21/18 0129 07/21/18 0151 07/21/18 1026 07/22/18 1630 07/23/18 0839 07/25/18 0623  WBC 8.6  --  7.7  --  8.2 7.7  NEUTROABS 5.3  --   --   --   --   --   HGB 10.6* 11.9* 10.9* 12.9* 10.8* 11.0*  HCT 32.7* 35.0* 34.7* 38.0* 32.8* 35.3*  MCV 100.0  --  100.0  --  98.5 99.7  PLT 248  --  264  --  180 167    Cardiac Enzymes: No results for input(s): CKTOTAL, CKMB, CKMBINDEX, TROPONINI in the last 168 hours.  BNP: Invalid input(s): POCBNP  CBG: Recent Labs  Lab 07/24/18 0832 07/24/18 1200 07/24/18 1742 07/24/18 2130 07/25/18 0738  GLUCAP 108* 106* 111* 154* 91    Microbiology: Results for orders placed or performed during the hospital encounter of 07/20/18  Surgical PCR screen     Status: None   Collection Time: 07/22/18 12:00 AM  Result Value Ref Range Status   MRSA, PCR NEGATIVE NEGATIVE Final   Staphylococcus aureus NEGATIVE NEGATIVE Final    Comment: (NOTE) The Xpert SA Assay (FDA approved for NASAL specimens in patients 28 years of age and older), is one component of a comprehensive surveillance program. It is not intended to diagnose infection nor to guide or monitor treatment. Performed at [facility], [place] [address] [place], [place] [address]     Coagulation Studies: No results for input(s): LABPROT, INR in the last 72 hours.  Urinalysis: No results for input(s): COLORURINE, LABSPEC, PHURINE, GLUCOSEU, HGBUR, BILIRUBINUR, KETONESUR, PROTEINUR, UROBILINOGEN, NITRITE, LEUKOCYTESUR in the last 72 hours.  Invalid input(s): APPERANCEUR    Imaging: Ct Head Wo Contrast  Result Date: 07/24/2018 CLINICAL DATA:  Follow-up CT for bilateral subdural hematomas, status post evacuation. EXAM: CT HEAD WITHOUT CONTRAST TECHNIQUE: Contiguous axial images were  obtained from the base of the skull through the vertex without intravenous contrast. COMPARISON:  Prior CT from 07/20/2018. FINDINGS: Brain: Fairly sizable mixed attenuation bilateral subdural hematomas again seen. There has been performance of bilateral frontal burr hole craniotomy for subdural evacuation. Subdural drain extends through the right frontal burr hole with tip overlying the inferior right frontal convexity. Scattered foci of extra-axial pneumocephalus seen bilaterally, left greater than right. Since previous exam, the right subdural collection is slightly decreased in size now measuring 15 mm in maximal thickness, previously 18 mm. The left subdural collection is relatively similar measuring 15 mm in maximal thickness. Degree of hyperdense blood products within both of these collections overall relatively similar. Associated trace left-to-right shift relatively unchanged. Stable ventricular size and morphology. No hydrocephalus or ventricular trapping. Basilar cisterns remain patent. Underlying atrophy with chronic small vessel ischemic disease again noted. No other new acute intracranial hemorrhage. No acute large vessel territory infarct. Vascular: No hyperdense vessel. Scattered vascular calcifications noted within the carotid siphons. Skull: Postoperative changes from prior bifrontal burr hole craniotomy. Skin staples remain in place. Sinuses/Orbits: Globes and orbital soft tissues demonstrate no acute finding. Visible paranasal sinuses and mastoid air cells are clear. Other: None. IMPRESSION: 1. Postoperative changes from interval bilateral frontal burr hole craniotomy for subdural evacuation. Right-sided subdural drain in place with tip overlying the anterior right frontal convexity. Mixed attenuation bilateral subdural hematomas slightly decreased in size on the right, and relatively similar in size on the left. No significant midline shift or other complication. 2. No other new acute  intracranial abnormality. Electronically Signed   By: Jeannine Boga M.D.   On: 07/24/2018 06:26     Medications:   . sodium chloride    . sodium chloride Stopped (07/24/18 2025)  . dextrose Stopped (07/24/18 1130)  . levETIRAcetam 500 mg (07/25/18 0738)   . Chlorhexidine Gluconate Cloth  6 each Topical Q0600  . docusate sodium  100 mg Oral BID  . donepezil  10 mg Oral QHS  . insulin aspart  0-5 Units Subcutaneous QHS  . insulin aspart  0-9 Units Subcutaneous TID WC  . levothyroxine  50 mcg Oral QAC breakfast  . memantine  10 mg Oral BID  . pantoprazole  40 mg Oral Daily  . polyethylene glycol  17 g Oral Daily  . senna  1 tablet Oral BID  . sevelamer carbonate  800 mg Oral Q supper  . simvastatin  20 mg Oral QHS  . sodium chloride flush  3 mL Intravenous Q12H  . sodium chloride flush  3 mL Intravenous Q12H  . vitamin B-12  1,000 mcg Oral Daily   sodium chloride, acetaminophen **OR** acetaminophen, bisacodyl, diphenhydrAMINE, fentaNYL (SUBLIMAZE) injection, labetalol, ondansetron **OR** ondansetron (ZOFRAN) IV, polyethylene glycol, polyvinyl alcohol, promethazine, sodium chloride flush, sodium phosphate  Assessment/ Plan:  Dialysis Orders: MWF [facility] 4 hrs 180 NRe 400/Autoflow 1.5 71 kg 2.0K/2.0 Ca  R AVF -Heparin 1800 units IV -Mircera 150 mcg IV q 2 weeks (last dose 07/16/18 Last HGB 11.4 07/16/18)  Assessment/Plan: 1.  Bilateral acute on chronic subdural hematoma-status post bur hole placement day #3 drains removed 2.  Multiple rib fractures-per primary. Does not seem to be painful.  3.  ESRD -  MWF  . K+ 4.7 no heparin dialysis per schedule dialysis 07/25/2018 4.  Hypertension/volume  - Hypertensive at present. Avoid hypotension while on HD. Pre wt 68.9 kg-has been leaving under EDW at OP center. UFG 1.3 liters removed 07/21/2018. 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  5.  Anemia  -hemoglobin 10.9 6.  Metabolic bone disease -  Ca 8.7. NPO at present. Continue  binders when able to eat.   Phosphorus 6.4 Albumin 2.5 7.  Nutrition -NPO  8.  DM-per primary 9.   Hypothyroidism-per primary 10  Dementia-per primary    LOS: Jerome @TODAY @1 :53 PM

## 2018-07-25 NOTE — Progress Notes (Signed)
Patient ID: [name], male   DOB: Jan 30, 1941, 77 y.o.   MRN: [id number] Sensorium appears to be clearing somewhat.  Still somewhat confused.  Patient is a good candidate for comprehensive inpatient rehabilitation.  Incision sites and surgical sites appear clean and dry.  I will repeat a CT scan on Monday to check his progress.  He may be transferred out of the intensive care unit if necessary.

## 2018-07-25 NOTE — Consult Note (Signed)
Physical Medicine and Rehabilitation Consult   Reason for Consult: B-SDH with functional deficits Referring Physician: Dr.    Harrison Mons: [name] is a 77 y.o. male with history of ESRD-hemodialysis MWF, T2DM, recent GIB due to AVM, chronic hypotension, dementia, OSA; who was admitted to [facility] on 07/20/2018 with worsening of confusion headaches and multiple falls.  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  He was evaluated by Dr. Ellene Route and underwent bilateral bur hole evacuation of SDH on 10/29.  Swallow function within normal limits and patient on regular textures.  Therapy ongoing and patient is showing improvement in balance as well as improvement in ability to follow commands.  CIR recommended due to functional deficits   ROS    Past Medical History:  Diagnosis Date  . Allergy   . Anemia   . Arthritis   . Cataract    bil cateracts removed  . Coronary artery disease   . Diabetes mellitus    Type 2  . Diverticulitis   . ED (erectile dysfunction)   . Elevated homocysteine ([place])   . ESRD (end stage renal disease) on dialysis ([place]) 03/2015  . GERD (gastroesophageal reflux disease)    pepto   . Gout   . Hyperlipidemia   . Hypertension   . Hypothyroidism   . Pneumonia   . PVD (peripheral vascular disease) ([place])    has plastic aorta  . Renal insufficiency   . Seasonal allergies   . Shortness of breath dyspnea   . Sleep apnea    does not wear c-pap  . Thyroid disease     Past Surgical History:  Procedure Laterality Date  . aortobifemoral bypass    . AV FISTULA PLACEMENT Left 12/01/2013   Procedure: ARTERIOVENOUS (AV) FISTULA CREATION- LEFT BRACHIOCEPHALIC;  Surgeon: Angelia Mould, MD;  Location: [place];  Service: Vascular;  Laterality: Left;  . [place] TRANSPOSITION Right 07/27/2014   Procedure: BASCILIC VEIN TRANSPOSITION;  Surgeon: Angelia Mould, MD;   Location: [place];  Service: Vascular;  Laterality: Right;  . BREAST SURGERY     left - granulomatous mastitis  . BURR HOLE Bilateral 07/22/2018   Procedure: BILATERAL BURR HOLES;  Surgeon: Kristeen Miss, MD;  Location: [place];  Service: Neurosurgery;  Laterality: Bilateral;  . COLONOSCOPY    . 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 ENTEROSCOPY N/A 06/03/2018   Procedure: ENTEROSCOPY;  Surgeon: Lavena Bullion, DO;  Location: [place];  Service: Gastroenterology;  Laterality: N/A;  . EYE SURGERY Bilateral    cataracts  . HEMODIALYSIS INPATIENT  01/17/2018      . HOT HEMOSTASIS N/A 06/03/2018   Procedure: HOT HEMOSTASIS (ARGON PLASMA COAGULATION/BICAP);  Surgeon: Lavena Bullion, DO;  Location: [facility] ENDOSCOPY;  Service: Gastroenterology;  Laterality: N/A;  . REVISON OF ARTERIOVENOUS FISTULA Left 02/09/2014   Procedure: REVISON OF LEFT ARTERIOVENOUS FISTULA - RESECTION OF RENDUNDANT VEIN;  Surgeon: Angelia Mould, MD;  Location: [place];  Service: Vascular;  Laterality: Left;  . SBO with lysis adhesions    . SHUNTOGRAM Left 04/19/2014   Procedure: FISTULOGRAM;  Surgeon: Angelia Mould, MD;  Location: [facility] CATH LAB;  Service: Cardiovascular;  Laterality: Left;  . UNILATERAL UPPER EXTREMEITY ANGIOGRAM N/A 07/12/2014   Procedure: UNILATERAL UPPER Anselmo Rod;  Surgeon: Angelia Mould, MD;  Location: [facility] CATH LAB;  Service: Cardiovascular;  Laterality: N/A;    Family History  Problem Relation Age of Onset  . Aneurysm Mother   . Heart disease Father   . Stroke Father   . Hypertension Father   . Diabetes Father   . Dementia Neg Hx   . Colon cancer Neg Hx   . Esophageal cancer Neg Hx   . Pancreatic cancer Neg Hx   . Prostate cancer Neg Hx   . Rectal cancer Neg Hx   . Stomach cancer Neg Hx     Social History:  reports that he quit smoking about 23 years ago. He has never used smokeless tobacco. He reports that he does not drink alcohol or use drugs.     Allergies  Allergen Reactions  . Penicillins Rash    Has patient had a PCN reaction causing immediate rash, facial/tongue/throat swelling, SOB or lightheadedness with hypotension: Yes Has patient had a PCN reaction causing severe rash involving mucus membranes or skin necrosis: Yes Has patient had a PCN reaction that required hospitalization: No Has patient had a PCN reaction occurring within the last 10 years: No If all of the above answers are "NO", then may proceed with Cephalosporin use.     Medications Prior to Admission  Medication Sig Dispense Refill  . acetaminophen (TYLENOL) 500 MG tablet Take 1,000 mg by mouth 2 (two) times daily as needed (for pain).     Marland Kitchen allopurinol (ZYLOPRIM) 100 MG tablet Take 100 mg by mouth daily as needed (pain).     Marland Kitchen aspirin 325 MG tablet Take 325 mg by mouth daily.     . Carboxymethylcellulose Sod PF 0.25 % SOLN Place 1 drop into both eyes as needed (dry eyes).    . clobetasol (TEMOVATE) 0.05 % external solution Apply 1 application topically 2 (two) times daily as needed (irritation). Apply to scalp  0  . denosumab (PROLIA) 60 MG/ML SOSY injection Inject 60 mg into the skin every 6 (six) months.    . donepezil (ARICEPT) 10 MG tablet Take 1 tablet (10 mg total) by mouth at bedtime. 30 tablet 11  . FLUOCINOLONE ACETONIDE SCALP 0.01 % OIL Apply 1 application topically at bedtime. Apply to scalp  0  . fluticasone (CUTIVATE) 0.05 % cream Apply 1 application topically 2 (two) times daily as needed (irritation). Apply to face  0  . fluticasone (FLONASE) 50 MCG/ACT nasal spray Place 1 spray into both nostrils daily. (Patient taking differently: Place 1 spray into both nostrils daily as needed for allergies. ) 16 g 2  . ketoconazole (NIZORAL) 2 % shampoo Apply 1 application topically 2 (two) times a week. (Patient taking differently: Apply 1 application topically at bedtime. ) 120 mL 0  . levothyroxine (SYNTHROID, LEVOTHROID) 50 MCG tablet Take 1 tablet (50  mcg total) by mouth daily. 90 tablet 1  . linaGLIPtin (TRADJENTA PO) Take 5 mg by mouth daily.    Marland Kitchen loratadine (CLARITIN) 10 MG tablet Take 10 mg by mouth daily as needed for allergies.     . midodrine (PROAMATINE) 10 MG tablet Take 10 mg by mouth 3 (three) times daily.    . Multiple Vitamins-Minerals (MULTIVITAMIN WITH MINERALS) tablet Take 1 tablet by mouth daily.    . Naftifine HCl (NAFTIN) 2 % CREA Apply to feet daily for fungal infection (Patient taking differently: Apply 1 application topically daily as needed (for fungal infection). Apply to feet) 60 g prn  . Nutritional Supplements (FEEDING SUPPLEMENT, NEPRO CARB STEADY,) LIQD Take 237 mLs by mouth daily.    Marland Kitchen  omeprazole (PRILOSEC) 40 MG capsule Take 1 capsule (40 mg total) by mouth daily. 30 capsule 0  . polyethylene glycol (MIRALAX / GLYCOLAX) packet Take 17 g by mouth daily as needed for mild constipation.     Marland Kitchen RENVELA 800 MG tablet Take 1,600 mg by mouth 3 (three) times daily with meals.   0  . simvastatin (ZOCOR) 20 MG tablet Take 20 mg by mouth at bedtime.     . vitamin B-12 (CYANOCOBALAMIN) 1000 MCG tablet Take 1 tablet (1,000 mcg total) by mouth daily. 30 tablet 0  . Blood Glucose Monitoring Suppl (FREESTYLE FREEDOM LITE) w/Device KIT Use to check blood sugar 2 times per day dx code E11.65 1 each 0  . glucose blood (FREESTYLE LITE) test strip Use as instructed to check blood sugar 3 times daily. 200 each 12  . glucose blood (ONETOUCH VERIO) test strip Use to check blood sugar 2 times per day. Dx code E11.65 100 each 2  . Insulin Pen Needle 31G X 5 MM MISC Use with Victoza pen once a day 30 each 1  . Lancets (FREESTYLE) lancets Use as instructed to check blood sugar 2 times per day dx code E11.65 100 each 3  . liraglutide (VICTOZA) 18 MG/3ML SOPN Inject 0.1 mLs (0.6 mg total) into the skin daily. (Patient not taking: Reported on 07/21/2018) 2 pen 1  . memantine (NAMENDA TITRATION PACK) tablet pack 5 mg/day for =1 week; 5 mg twice  daily for =1 week; 15 mg/day given in 5 mg and 10 mg separated doses for =1 week; then 10 mg twice daily 49 tablet 0  . nystatin-triamcinolone (MYCOLOG II) cream Apply 1 application topically 2 (two) times daily. (Patient not taking: Reported on 07/21/2018) 30 g 0    Home: Home Living Family/patient expects to be discharged to:: Private residence Living Arrangements: Spouse/significant other Type of Home: House Home Layout: Two level Bathroom Shower/Tub: Tub/shower unit Additional Comments: history taken from chart review, patient only alert/ oriented to self so unreliable historian at this time  Functional History: Prior Function Comments: unclear Functional Status:  Mobility: Bed Mobility Overal bed mobility: Needs Assistance Bed Mobility: Supine to Sit Supine to sit: Min assist Sit to supine: Min assist General bed mobility comments: min assist to move legs to EOB and elevate trunk Transfers Overall transfer level: Needs assistance Equipment used: Rolling walker (2 wheeled) Transfers: Sit to/from Stand Sit to Stand: Min assist, Min guard General transfer comment: min assist to rise from bed, minguard to rise from toilet with rail, increased trials to initiate standing on each attempt  Ambulation/Gait Ambulation/Gait assistance: Mod assist Gait Distance (Feet): 120 Feet Assistive device: Rolling walker (2 wheeled) Gait Pattern/deviations: Step-through pattern, Decreased stride length General Gait Details: cues for posture, position in RW, 3 standing rest breaks, cues for direction to room. Pt then walked 2x 15' after hall ambulation to bathroom Gait velocity: decreased Gait velocity interpretation: <1.8 ft/sec, indicate of risk for recurrent falls    ADL:    Cognition: Cognition Overall Cognitive Status: No family/caregiver present to determine baseline cognitive functioning Orientation Level: Oriented to person, Oriented to place Cognition Arousal/Alertness:  Awake/alert Behavior During Therapy: Flat affect Overall Cognitive Status: No family/caregiver present to determine baseline cognitive functioning Area of Impairment: Orientation, Attention, Memory, Following commands, Safety/judgement, Awareness, Problem solving Orientation Level: Disoriented to, Place, Time, Situation Current Attention Level: Selective Memory: Decreased short-term memory Following Commands: Follows one step commands consistently Safety/Judgement: Decreased awareness of deficits Awareness: Intellectual Problem Solving:  Slow processing General Comments: pt oriented to place, day, situation and responding appropriately throughout session   Blood pressure (!) 124/56, pulse 79, temperature 98 F (36.7 C), temperature source Oral, resp. rate 14, height _0  (1.803 m), weight 67.5 kg, SpO2 100 %. Physical Exam  Results for orders placed or performed during the hospital encounter of 07/20/18 (from the past 24 hour(s))  Glucose, capillary     Status: Abnormal   Collection Time: 07/24/18  5:42 PM  Result Value Ref Range   Glucose-Capillary 111 (H) 70 - 99 mg/dL   Comment 1 Notify RN    Comment 2 Document in Chart   Glucose, capillary     Status: Abnormal   Collection Time: 07/24/18  9:30 PM  Result Value Ref Range   Glucose-Capillary 154 (H) 70 - 99 mg/dL  CBC     Status: Abnormal   Collection Time: 07/25/18  6:23 AM  Result Value Ref Range   WBC 7.7 4.0 - 10.5 K/uL   RBC 3.54 (L) 4.22 - 5.81 MIL/uL   Hemoglobin 11.0 (L) 13.0 - 17.0 g/dL   HCT 35.3 (L) 39.0 - 52.0 %   MCV 99.7 80.0 - 100.0 fL   MCH 31.1 26.0 - 34.0 pg   MCHC 31.2 30.0 - 36.0 g/dL   RDW 15.7 (H) 11.5 - 15.5 %   Platelets 167 150 - 400 K/uL   nRBC 0.3 (H) 0.0 - 0.2 %  Basic metabolic panel     Status: Abnormal   Collection Time: 07/25/18  6:23 AM  Result Value Ref Range   Sodium 134 (L) 135 - 145 mmol/L   Potassium 3.4 (L) 3.5 - 5.1 mmol/L   Chloride 99 98 - 111 mmol/L   CO2 27 22 - 32 mmol/L     Glucose, Bld 90 70 - 99 mg/dL   BUN 21 8 - 23 mg/dL   Creatinine, Ser 5.48 (H) 0.61 - 1.24 mg/dL   Calcium 10.3 8.9 - 10.3 mg/dL   GFR calc non Af Amer 9 (L) >60 mL/min   GFR calc Af Amer 11 (L) >60 mL/min   Anion gap 8 5 - 15  Glucose, capillary     Status: None   Collection Time: 07/25/18  7:38 AM  Result Value Ref Range   Glucose-Capillary 91 70 - 99 mg/dL  Glucose, capillary     Status: Abnormal   Collection Time: 07/25/18  1:52 PM  Result Value Ref Range   Glucose-Capillary 120 (H) 70 - 99 mg/dL   Ct Head Wo Contrast  Result Date: 07/24/2018 CLINICAL DATA:  Follow-up CT for bilateral subdural hematomas, status post evacuation. EXAM: CT HEAD WITHOUT CONTRAST TECHNIQUE: Contiguous axial images were obtained from the base of the skull through the vertex without intravenous contrast. COMPARISON:  Prior CT from 07/20/2018. FINDINGS: Brain: Fairly sizable mixed attenuation bilateral subdural hematomas again seen. There has been performance of bilateral frontal burr hole craniotomy for subdural evacuation. Subdural drain extends through the right frontal burr hole with tip overlying the inferior right frontal convexity. Scattered foci of extra-axial pneumocephalus seen bilaterally, left greater than right. Since previous exam, the right subdural collection is slightly decreased in size now measuring 15 mm in maximal thickness, previously 18 mm. The left subdural collection is relatively similar measuring 15 mm in maximal thickness. Degree of hyperdense blood products within both of these collections overall relatively similar. Associated trace left-to-right shift relatively unchanged. Stable ventricular size and morphology. No hydrocephalus or ventricular trapping. Basilar  cisterns remain patent. Underlying atrophy with chronic small vessel ischemic disease again noted. No other new acute intracranial hemorrhage. No acute large vessel territory infarct. Vascular: No hyperdense vessel. Scattered  vascular calcifications noted within the carotid siphons. Skull: Postoperative changes from prior bifrontal burr hole craniotomy. Skin staples remain in place. Sinuses/Orbits: Globes and orbital soft tissues demonstrate no acute finding. Visible paranasal sinuses and mastoid air cells are clear. Other: None. IMPRESSION: 1. Postoperative changes from interval bilateral frontal burr hole craniotomy for subdural evacuation. Right-sided subdural drain in place with tip overlying the anterior right frontal convexity. Mixed attenuation bilateral subdural hematomas slightly decreased in size on the right, and relatively similar in size on the left. No significant midline shift or other complication. 2. No other new acute intracranial abnormality. Electronically Signed   By: Jeannine Boga M.D.   On: 07/24/2018 06:26     Assessment/Plan: Diagnosis: traumatic SDH's 1. Does the need for close, 24 hr/day medical supervision in concert with the patient's rehab needs make it unreasonable for this patient to be served in a less intensive setting? Yes 2. Co-Morbidities requiring supervision/potential complications: ESRD, GIB, dementia, dm2, cad 3. Due to bladder management, bowel management, safety, skin/wound care, disease management, medication administration, pain management and patient education, does the patient require 24 hr/day rehab nursing? Yes 4. Does the patient require coordinated care of a physician, rehab nurse, PT (1-2 hrs/day, 5 days/week), OT (1-2 hrs/day, 5 days/week) and SLP (1-2 hrs/day, 5 days/week) to address physical and functional deficits in the context of the above medical diagnosis(es)? Yes Addressing deficits in the following areas: balance, endurance, locomotion, strength, transferring, bowel/bladder control, bathing, dressing, feeding, grooming, toileting, cognition and psychosocial support 5. Can the patient actively participate in an intensive therapy program of at least 3 hrs of  therapy per day at least 5 days per week? Yes 6. The potential for patient to make measurable gains while on inpatient rehab is good 7. Anticipated functional outcomes upon discharge from inpatient rehab are supervision  with PT, supervision with OT, supervision and min assist with SLP. 8. Estimated rehab length of stay to reach the above functional goals is: 13-18 days 9. Anticipated D/C setting: Home 10. Anticipated post D/C treatments: [place] therapy 11. Overall Rehab/Functional Prognosis: excellent  RECOMMENDATIONS: This patient's condition is appropriate for continued rehabilitative care in the following setting: CIR Patient has agreed to participate in recommended program. Yes Note that insurance prior authorization may be required for reimbursement for recommended care.  Comment: Rehab Admissions Coordinator to follow up.  Thanks,  Meredith Staggers, MD, Mellody Drown   Bary Leriche, PA-C 07/25/2018

## 2018-07-26 LAB — GLUCOSE, CAPILLARY
Glucose-Capillary: 100 mg/dL — ABNORMAL HIGH (ref 70–99)
Glucose-Capillary: 127 mg/dL — ABNORMAL HIGH (ref 70–99)
Glucose-Capillary: 176 mg/dL — ABNORMAL HIGH (ref 70–99)
Glucose-Capillary: 123 mg/dL — ABNORMAL HIGH (ref 70–99)

## 2018-07-26 NOTE — Progress Notes (Addendum)
[facility] PROGRESS NOTE  [name] GBT:[id number] DOB: December 18, 1940 DOA: 07/20/2018  PCP: Elby Showers, MD  Brief History/Interval Summary: 77 y.o.malewith medical history significant fortype 2 diabetes mellitus, hypothyroidism, dementia, and end-stage renal disease on hemodialysis, presented to the emergency department with increased confusion. Patient was admitted last month with GI bleeding and generalized weakness, was transfused, but has continued to experience generalized weakness and his wife has noted some difficulty with gait and reports that he has fallen multiple times over the last few months.  Evaluation was concerning for acute on chronic subdural hematoma.  Patient was hospitalized for further management.  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  Nephrology.  Procedures: Bilateral burr holes for drainage of subdural hematomas  Antibiotics: None  Subjective/Interval History: Patient states that he is doing well.  Denies any headaches.  Does complain of some soreness in his thighs on both sides especially when he moves his leg.  No other complaints offered today.     Objective:  Vital Signs  Vitals:   07/26/18 0700 07/26/18 0715 07/26/18 0800 07/26/18 0900  BP: (!) 128/59   (!) 146/59  Pulse: 65 80 (!) 109   Resp: 18 18 16    Temp:  97.6 F (36.4 C)    TempSrc:  Oral    SpO2: 100% 100% 99%   Weight:      Height:        Intake/Output Summary (Last 24 hours) at 07/26/2018 1013 Last data filed at 07/26/2018 0720 Gross per 24 hour  Intake 385.75 ml  Output 2000 ml  Net -1614.25 ml   Filed Weights   07/23/18 1221 07/25/18 0805 07/25/18 1153  Weight: 67.6 kg 69.4 kg 67.5 kg    General appearance: Awake alert.  In no distress. Dressing noted over his scalp Resp: Normal effort.  Clear to auscultation bilaterally. Cardio: S1-S2 is normal regular.  No S3-S4.  No rubs murmurs or bruit GI:  Abdomen soft.  Nontender nondistended.  Bowel sounds are present normal.  No masses organomegaly Extremities: No obvious swelling noted over both thighs.  Nontender to palpation.  Good range of motion of both hips.  Able to lift his leg straight up. Neurologic: No obvious focal neurological deficits.  Lab Results:  Data Reviewed: I have personally reviewed following labs and imaging studies  CBC: Recent Labs  Lab 07/21/18 0129 07/21/18 0151 07/21/18 1026 07/22/18 1630 07/23/18 0839 07/25/18 0623  WBC 8.6  --  7.7  --  8.2 7.7  NEUTROABS 5.3  --   --   --   --   --   HGB 10.6* 11.9* 10.9* 12.9* 10.8* 11.0*  HCT 32.7* 35.0* 34.7* 38.0* 32.8* 35.3*  MCV 100.0  --  100.0  --  98.5 99.7  PLT 248  --  264  --  180 710    Basic Metabolic Panel: Recent Labs  Lab 07/21/18 0129 07/21/18 0151 07/21/18 1026 07/22/18 1630 07/23/18 0839 07/25/18 0623  NA 136 134* 138 135 136 134*  K 3.7 3.6 3.8 3.5 4.0 3.4*  CL 96* 95* 94*  --  99 99  CO2 31  --  29  --  25 27  GLUCOSE 107* 101* 95 94 122* 90  BUN 31* 37* 34*  --  19 21  CREATININE 7.36* 8.10* 8.07*  --  6.01* 5.48*  CALCIUM 11.2*  --  11.1*  --  9.8 10.3  PHOS  --   --  6.4*  --  7.1*  --     GFR: Estimated Creatinine Clearance: 10.9 mL/min (A) (by C-G formula based on SCr of 5.48 mg/dL (H)).  Liver Function Tests: Recent Labs  Lab 07/21/18 0129 07/21/18 1026 07/23/18 0839  AST 35  --   --   ALT 13  --   --   ALKPHOS 84  --   --   BILITOT 0.8  --   --   PROT 5.1*  --   --   ALBUMIN 2.5* 2.5* 2.7*    Coagulation Profile: Recent Labs  Lab 07/22/18 0612  INR 0.97    CBG: Recent Labs  Lab 07/24/18 2130 07/25/18 0738 07/25/18 1352 07/25/18 1636 07/25/18 2109  GLUCAP 154* 91 120* 186* 83     Recent Results (from the past 240 hour(s))  Surgical PCR screen     Status: None   Collection Time: 07/22/18 12:00 AM  Result Value Ref Range Status   MRSA, PCR NEGATIVE NEGATIVE Final   Staphylococcus aureus  NEGATIVE NEGATIVE Final    Comment: (NOTE) The Xpert SA Assay (FDA approved for NASAL specimens in patients 65 years of age and older), is one component of a comprehensive surveillance program. It is not intended to diagnose infection nor to guide or monitor treatment. Performed at [facility], [place] [address], [place], [place] [address]       Radiology Studies: No results found.   Medications:  Scheduled: . Chlorhexidine Gluconate Cloth  6 each Topical Q0600  . docusate sodium  100 mg Oral BID  . donepezil  10 mg Oral QHS  . insulin aspart  0-5 Units Subcutaneous QHS  . insulin aspart  0-9 Units Subcutaneous TID WC  . levothyroxine  50 mcg Oral QAC breakfast  . memantine  10 mg Oral BID  . pantoprazole  40 mg Oral Daily  . polyethylene glycol  17 g Oral Daily  . senna  1 tablet Oral BID  . sevelamer carbonate  800 mg Oral Q supper  . simvastatin  20 mg Oral QHS  . sodium chloride flush  3 mL Intravenous Q12H  . sodium chloride flush  3 mL Intravenous Q12H  . vitamin B-12  1,000 mcg Oral Daily   Continuous: . sodium chloride    . sodium chloride Stopped (07/25/18 0830)  . dextrose 25 mL/hr at 07/26/18 0856  . 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  Patient underwent burr hole on 10/29.  Patient accidentally pulled out 1 of his drain overnight on 10/30.  The other one was taken out on 10/31.  Further management per neurosurgery.  Patient remains on Keppra.  Mental status has improved.     Multiple rib fractures Incentive spirometry.  Pain control.  Respiratory status is stable.  End-stage renal disease on  dialysis on Monday Wednesday Friday Management per nephrology.  Has tolerated dialysis well.  History of dementia Patient on Aricept and Namenda at home.  Seen by speech therapy.  On regular diet.  Hypothyroidism Continue home medications.  Diabetes mellitus type 2 HbA1c 5.2.  Continue SSI.  Anemia of chronic disease Hemoglobin has been stable.  No evidence of overt bleeding.  Discomfort bilateral thighs This is present mainly when he tries to lift his  legs up.  Examination does not suggest any limitation in movement of his hip joints bilaterally.  No obvious deformity or lesions noted on examination.  He does report history of chronic low back pain.  Continue with mild analgesic agents as needed.  Physical therapy.  Patient told that he may need further work-up if his symptoms get worse or if they do not improve in 2 to 3 weeks. Could consider discontinuing statin if symptoms do not improve.  DVT Prophylaxis: SCDs    Code Status: Full code Family Communication: No family at bedside Disposition Plan: Patient is being considered for inpatient rehabilitation.  CT head to be repeated on Monday.    LOS: 5 days   [facility] Pager [phone] 07/26/2018, 10:13 AM  If 7PM-7AM, please contact night-coverage at [email or web address], password [facility]

## 2018-07-26 NOTE — Progress Notes (Signed)
[facility] ROUNDING NOTE   Subjective:   S/p Bur Holes day 3 for chronic bilateral subdural hemorrhage appreciate help from Dr. Ellene Route drains are now out.    Patient complains of thigh pain bilateral taking simvastatin and wonder if this could be a myalgia related to his lipid-lowering agent.  He is to be accepted by CIR for inpatient rehab  Blood pressure 149/59 pulse of 64 temperature 97.6 sats 100% room air  Sodium 134 potassium 3.4 chloride 99 CO2 27 glucose 90 BUN 21 creatinine 5.48 calcium 10.3 WBC 7.7 hemoglobin 11.0 platelets 167   Objective:  Vital signs in last 24 hours:  Temp:  [97.5 F (36.4 C)-98.1 F (36.7 C)] 97.6 F (36.4 C) (11/02 0715) Pulse Rate:  [61-109] 109 (11/02 0800) Resp:  [11-24] 16 (11/02 0800) BP: (78-167)/(41-118) 146/59 (11/02 0900) SpO2:  [99 %-100 %] 99 % (11/02 0800) Weight:  [67.5 kg] 67.5 kg (11/01 1153)  Weight change:  Filed Weights   07/23/18 1221 07/25/18 0805 07/25/18 1153  Weight: 67.6 kg 69.4 kg 67.5 kg    Intake/Output: I/O last 3 completed shifts: In: 411.3 [P.O.:200; I.V.:11.3; IV Piggyback:200] Out: 2000 [Other:2000]   Intake/Output this shift:  Total I/O In: 180 [P.O.:80; IV Piggyback:100] Out: -    alert and oriented in all extremities CVS- RRR no murmurs rubs or gallops JVP not elevated RS- CTA ABD- BS present soft non-distended EXT- no edema AV fistula right thrill and bruit Neurology follows commands moving all extremities   Basic Metabolic Panel: Recent Labs  Lab 07/21/18 0129 07/21/18 0151 07/21/18 1026 07/22/18 1630 07/23/18 0839 07/25/18 0623  NA 136 134* 138 135 136 134*  K 3.7 3.6 3.8 3.5 4.0 3.4*  CL 96* 95* 94*  --  99 99  CO2 31  --  29  --  25 27  GLUCOSE 107* 101* 95 94 122* 90  BUN 31* 37* 34*  --  19 21  CREATININE 7.36* 8.10* 8.07*  --  6.01* 5.48*  CALCIUM 11.2*  --  11.1*  --  9.8 10.3  PHOS  --   --  6.4*  --  7.1*  --     Liver Function Tests: Recent Labs  Lab  07/21/18 0129 07/21/18 1026 07/23/18 0839  AST 35  --   --   ALT 13  --   --   ALKPHOS 84  --   --   BILITOT 0.8  --   --   PROT 5.1*  --   --   ALBUMIN 2.5* 2.5* 2.7*   No results for input(s): LIPASE, AMYLASE in the last 168 hours. No results for input(s): AMMONIA in the last 168 hours.  CBC: Recent Labs  Lab 07/21/18 0129 07/21/18 0151 07/21/18 1026 07/22/18 1630 07/23/18 0839 07/25/18 0623  WBC 8.6  --  7.7  --  8.2 7.7  NEUTROABS 5.3  --   --   --   --   --   HGB 10.6* 11.9* 10.9* 12.9* 10.8* 11.0*  HCT 32.7* 35.0* 34.7* 38.0* 32.8* 35.3*  MCV 100.0  --  100.0  --  98.5 99.7  PLT 248  --  264  --  180 167    Cardiac Enzymes: No results for input(s): CKTOTAL, CKMB, CKMBINDEX, TROPONINI in the last 168 hours.  BNP: Invalid input(s): POCBNP  CBG: Recent Labs  Lab 07/24/18 2130 07/25/18 0738 07/25/18 1352 07/25/18 1636 07/25/18 2109  GLUCAP 154* 91 120* 186* 83    Microbiology:  Results for orders placed or performed during the hospital encounter of 07/20/18  Surgical PCR screen     Status: None   Collection Time: 07/22/18 12:00 AM  Result Value Ref Range Status   MRSA, PCR NEGATIVE NEGATIVE Final   Staphylococcus aureus NEGATIVE NEGATIVE Final    Comment: (NOTE) The Xpert SA Assay (FDA approved for NASAL specimens in patients 47 years of age and older), is one component of a comprehensive surveillance program. It is not intended to diagnose infection nor to guide or monitor treatment. Performed at [facility], [place] [address], [place], [place] [address]     Coagulation Studies: No results for input(s): LABPROT, INR in the last 72 hours.  Urinalysis: No results for input(s): COLORURINE, LABSPEC, PHURINE, GLUCOSEU, HGBUR, BILIRUBINUR, KETONESUR, PROTEINUR, UROBILINOGEN, NITRITE, LEUKOCYTESUR in the last 72 hours.  Invalid input(s): APPERANCEUR    Imaging: No results found.   Medications:   . sodium chloride    . sodium chloride  Stopped (07/25/18 0830)  . dextrose 25 mL/hr at 07/26/18 0856  . levETIRAcetam 500 mg (07/26/18 0840)   . Chlorhexidine Gluconate Cloth  6 each Topical Q0600  . docusate sodium  100 mg Oral BID  . donepezil  10 mg Oral QHS  . insulin aspart  0-5 Units Subcutaneous QHS  . insulin aspart  0-9 Units Subcutaneous TID WC  . levothyroxine  50 mcg Oral QAC breakfast  . memantine  10 mg Oral BID  . pantoprazole  40 mg Oral Daily  . polyethylene glycol  17 g Oral Daily  . senna  1 tablet Oral BID  . sevelamer carbonate  800 mg Oral Q supper  . simvastatin  20 mg Oral QHS  . sodium chloride flush  3 mL Intravenous Q12H  . sodium chloride flush  3 mL Intravenous Q12H  . vitamin B-12  1,000 mcg Oral Daily   sodium chloride, acetaminophen **OR** acetaminophen, bisacodyl, diphenhydrAMINE, fentaNYL (SUBLIMAZE) injection, labetalol, ondansetron **OR** ondansetron (ZOFRAN) IV, polyethylene glycol, polyvinyl alcohol, promethazine, sodium chloride flush, sodium phosphate  Assessment/ Plan:  Dialysis Orders: MWF [facility] 4 hrs 180 NRe 400/Autoflow 1.5 71 kg 2.0K/2.0 Ca  R AVF -Heparin 1800 units IV -Mircera 150 mcg IV q 2 weeks (last dose 07/16/18 Last HGB 11.4 07/16/18)  Assessment/Plan: 1.  Bilateral acute on chronic subdural hematoma-status post bur hole placement day #4 drains have been removed 2.  Multiple rib fractures-per primary. Does not seem to be painful.  3.  ESRD -  MWF  . K+ 4.7 removal of 2 L on dialysis 07/25/2018 4.  Hypertension/volume  - Hypertensive at present. Avoid hypotension while on HD. Pre wt 68.9 kg-has been leaving under EDW at OP center. UFG 1.3 liters removed 07/21/2018. 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  5.  Anemia  -hemoglobin 10.9 6.  Metabolic bone disease -calcium levels increased.Marland KitchenNo vitamin D analogs or calcium based binders we will continue to follow. 7. Myalgias right and left thigh.  Have discussed with Dr. [place] Pink and will consider stopping statin if  this is interfering with his rehabilitation. 8.  Nutrition -NPO  9.  DM-per primary 9.   Hypothyroidism-per primary 10  Dementia-per primary    LOS: [place] @TODAY @11 :23 AM

## 2018-07-26 NOTE — Progress Notes (Signed)
  NEUROSURGERY PROGRESS NOTE   No issues overnight.  Denies concerns today  EXAM:  BP (!) 146/59 (BP Location: Left Arm)   Pulse (!) 109   Temp 97.6 F (36.4 C) (Oral)   Resp 16   Ht 5\' 11"  (1.803 m)   Wt 67.5 kg   SpO2 99%   BMI 20.75 kg/m   Awake Oriented to self and location but not year Speech fluent, appropriate  CN grossly intact  MAEW with good strength. Non-focal exam  PLAN Stable this am Exam non focal Plan for repeat head CT Monday Good candidate for CIR when stable for d/c

## 2018-07-27 LAB — CBC
HCT: 36.5 % — ABNORMAL LOW (ref 39.0–52.0)
Hemoglobin: 11.6 g/dL — ABNORMAL LOW (ref 13.0–17.0)
MCH: 31.2 pg (ref 26.0–34.0)
MCHC: 31.8 g/dL (ref 30.0–36.0)
MCV: 98.1 fL (ref 80.0–100.0)
Platelets: 158 10*3/uL (ref 150–400)
RBC: 3.72 MIL/uL — ABNORMAL LOW (ref 4.22–5.81)
RDW: 15.3 % (ref 11.5–15.5)
WBC: 7.5 10*3/uL (ref 4.0–10.5)
nRBC: 0 % (ref 0.0–0.2)

## 2018-07-27 LAB — BASIC METABOLIC PANEL
Anion gap: 12 (ref 5–15)
BUN: 26 mg/dL — ABNORMAL HIGH (ref 8–23)
CO2: 25 mmol/L (ref 22–32)
Calcium: 10 mg/dL (ref 8.9–10.3)
Chloride: 95 mmol/L — ABNORMAL LOW (ref 98–111)
Creatinine, Ser: 5.44 mg/dL — ABNORMAL HIGH (ref 0.61–1.24)
GFR calc Af Amer: 11 mL/min — ABNORMAL LOW (ref >60)
GFR calc non Af Amer: 9 mL/min — ABNORMAL LOW (ref >60)
Glucose, Bld: 93 mg/dL (ref 70–99)
Potassium: 4.9 mmol/L (ref 3.5–5.1)
Sodium: 132 mmol/L — ABNORMAL LOW (ref 135–145)

## 2018-07-27 LAB — GLUCOSE, CAPILLARY
Glucose-Capillary: 88 mg/dL (ref 70–99)
Glucose-Capillary: 156 mg/dL — ABNORMAL HIGH (ref 70–99)
Glucose-Capillary: 178 mg/dL — ABNORMAL HIGH (ref 70–99)
Glucose-Capillary: 101 mg/dL — ABNORMAL HIGH (ref 70–99)

## 2018-07-27 MED ORDER — CHLORHEXIDINE GLUCONATE CLOTH 2 % EX PADS
6.0000 | MEDICATED_PAD | Freq: Every day | CUTANEOUS | Status: DC
  Administered 2018-07-27: 6 via TOPICAL

## 2018-07-27 MED ORDER — MUPIROCIN 2 % EX OINT
TOPICAL_OINTMENT | CUTANEOUS | Status: AC
  Administered 2018-07-27: 16:00:00
  Filled 2018-07-27: qty 22

## 2018-07-27 NOTE — Progress Notes (Addendum)
[facility] PROGRESS NOTE  [name] VPX:[id number] DOB: 1941-04-19 DOA: 07/20/2018  PCP: Elby Showers, MD  Brief History/Interval Summary: 77 y.o.malewith medical history significant fortype 2 diabetes mellitus, hypothyroidism, dementia, and end-stage renal disease on hemodialysis, presented to the emergency department with increased confusion. Patient was admitted last month with GI bleeding and generalized weakness, was transfused, but has continued to experience generalized weakness and his wife has noted some difficulty with gait and reports that he has fallen multiple times over the last few months.  Evaluation was concerning for acute on chronic subdural hematoma.  Patient was hospitalized for further management.  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  Nephrology.  Procedures: Bilateral burr holes for drainage of subdural hematomas  Antibiotics: None  Subjective/Interval History: Patient continues to do well.  Denies any specific complaints.  No worsening in the soreness in both his thighs.  Objective:  Vital Signs  Vitals:   07/27/18 1000 07/27/18 1100 07/27/18 1200 07/27/18 1222  BP:      Pulse: 66     Resp: 12 (!) 33 14 14  Temp:      TempSrc:      SpO2: 100%     Weight:      Height:        Intake/Output Summary (Last 24 hours) at 07/27/2018 1259 Last data filed at 07/27/2018 0900 Gross per 24 hour  Intake 784.3 ml  Output 0 ml  Net 784.3 ml   Filed Weights   07/23/18 1221 07/25/18 0805 07/25/18 1153  Weight: 67.6 kg 69.4 kg 67.5 kg    General appearance: Awake alert.  In no distress Resp: Normal effort.  Clear to auscultation bilaterally Cardio: S1-S2 is normal regular.  No S3-S4.  No rubs murmurs or bruit GI: Abdomen soft.  Nontender nondistended Extremities: No deformity or abnormality noted over the thigh area in both lower extremities.  Able to lift his legs.  Good peripheral  pulses.  Neurologic: No obvious focal neurological deficits.  Lab Results:  Data Reviewed: I have personally reviewed following labs and imaging studies  CBC: Recent Labs  Lab 07/21/18 0129  07/21/18 1026 07/22/18 1630 07/23/18 0839 07/25/18 0623 07/27/18 0340  WBC 8.6  --  7.7  --  8.2 7.7 7.5  NEUTROABS 5.3  --   --   --   --   --   --   HGB 10.6*   < > 10.9* 12.9* 10.8* 11.0* 11.6*  HCT 32.7*   < > 34.7* 38.0* 32.8* 35.3* 36.5*  MCV 100.0  --  100.0  --  98.5 99.7 98.1  PLT 248  --  264  --  180 167 158   < > = values in this interval not displayed.    Basic Metabolic Panel: Recent Labs  Lab 07/21/18 0129 07/21/18 0151 07/21/18 1026 07/22/18 1630 07/23/18 0839 07/25/18 0623 07/27/18 0340  NA 136 134* 138 135 136 134* 132*  K 3.7 3.6 3.8 3.5 4.0 3.4* 4.9  CL 96* 95* 94*  --  99 99 95*  CO2 31  --  29  --  25 27 25   GLUCOSE 107* 101* 95 94 122* 90 93  BUN 31* 37* 34*  --  19 21 26*  CREATININE 7.36* 8.10* 8.07*  --  6.01* 5.48* 5.44*  CALCIUM 11.2*  --  11.1*  --  9.8 10.3 10.0  PHOS  --   --  6.4*  --  7.1*  --   --     GFR: Estimated Creatinine Clearance: 11 mL/min (A) (by C-G formula based on SCr of 5.44 mg/dL (H)).  Liver Function Tests: Recent Labs  Lab 07/21/18 0129 07/21/18 1026 07/23/18 0839  AST 35  --   --   ALT 13  --   --   ALKPHOS 84  --   --   BILITOT 0.8  --   --   PROT 5.1*  --   --   ALBUMIN 2.5* 2.5* 2.7*    Coagulation Profile: Recent Labs  Lab 07/22/18 0612  INR 0.97    CBG: Recent Labs  Lab 07/26/18 0845 07/26/18 1208 07/26/18 1627 07/26/18 2151 07/27/18 0742  GLUCAP 100* 127* 176* 123* 88     Recent Results (from the past 240 hour(s))  Surgical PCR screen     Status: None   Collection Time: 07/22/18 12:00 AM  Result Value Ref Range Status   MRSA, PCR NEGATIVE NEGATIVE Final   Staphylococcus aureus NEGATIVE NEGATIVE Final    Comment: (NOTE) The Xpert SA Assay (FDA approved for NASAL specimens in patients  54 years of age and older), is one component of a comprehensive surveillance program. It is not intended to diagnose infection nor to guide or monitor treatment. Performed at [facility], [place] [address] [place], [place] [address]       Radiology Studies: No results found.   Medications:  Scheduled: . Chlorhexidine Gluconate Cloth  6 each Topical Q0600  . Chlorhexidine Gluconate Cloth  6 each Topical Q0600  . docusate sodium  100 mg Oral BID  . donepezil  10 mg Oral QHS  . insulin aspart  0-5 Units Subcutaneous QHS  . insulin aspart  0-9 Units Subcutaneous TID WC  . levothyroxine  50 mcg Oral QAC breakfast  . memantine  10 mg Oral BID  . pantoprazole  40 mg Oral Daily  . polyethylene glycol  17 g Oral Daily  . senna  1 tablet Oral BID  . sevelamer carbonate  800 mg Oral Q supper  . simvastatin  20 mg Oral QHS  . sodium chloride flush  3 mL Intravenous Q12H  . sodium chloride flush  3 mL Intravenous Q12H  . vitamin B-12  1,000 mcg Oral Daily   Continuous: . sodium chloride    . sodium chloride Stopped (07/25/18 0830)  . dextrose Stopped (07/27/18 0800)  . levETIRAcetam 500 mg (07/27/18 0842)   PPI:RJJOAC chloride, acetaminophen **OR** acetaminophen, bisacodyl, diphenhydrAMINE, fentaNYL (SUBLIMAZE) injection, labetalol, ondansetron **OR** ondansetron (ZOFRAN) IV, polyethylene glycol, polyvinyl alcohol, promethazine, sodium chloride flush, sodium phosphate   Assessment/Plan:  Acute on chronic bilateral subdural hematoma Neurosurgery is following.  Patient underwent burr hole on 10/29.  Patient accidentally pulled out 1 of his drain overnight on 10/30.  The other one was taken out on 10/31.  Further management per neurosurgery.  Patient remains on Keppra.  Mental status has improved.   Plan is to repeat CT scan tomorrow morning.  Multiple rib fractures Incentive spirometry.  Pain control.  Respiratory status is stable.  End-stage renal disease on dialysis on  Monday Wednesday Friday Management per nephrology.  History of dementia Patient on Aricept and Namenda at home.  Seen by speech therapy.  On regular diet.  Hypothyroidism Continue home medications.  Diabetes mellitus type 2 HbA1c 5.2.  Continue SSI.  Anemia of chronic disease Hemoglobin has been stable.  No evidence of overt bleeding.  Elevated blood pressure Patient  noted to be hypertensive this morning.  According to nephrology he is usually on midodrine due to hypotension.  We will not treat this aggressively.  He is asymptomatic.  Defer to nephrology.  Not on midodrine currently.  Discomfort bilateral thighs This is present mainly when he tries to lift his legs up.  Examination does not suggest any limitation in movement of his hip joints bilaterally.  No obvious deformity or lesions noted on examination.  He does report history of chronic low back pain.  Continue with mild analgesic agents as needed.  Physical therapy.  Patient told that he may need further work-up if his symptoms get worse or if they do not improve in 2 to 3 weeks. Could consider discontinuing statin if symptoms do not improve.  Although he has been on statin for a long time.  DVT Prophylaxis: SCDs    Code Status: Full code Family Communication: No family at bedside Disposition Plan: Patient is being considered for inpatient rehabilitation.  CT head to be repeated on Monday.    LOS: 6 days   [facility] Pager [phone] 07/27/2018, 12:59 PM  If 7PM-7AM, please contact night-coverage at [email or web address], password [facility]

## 2018-07-27 NOTE — Progress Notes (Signed)
[facility] ROUNDING NOTE   Subjective:   S/p Bur Holes day 3 for chronic bilateral subdural hemorrhage appreciate help from Dr. Ellene Route drains are now out.     No complaints this morning.  He is to be accepted by CIR for inpatient rehab  Blood pressure 130/81 pulse 73 temperature 97.6 O2 sats 100% room air  Sodium 132 potassium 4.9 chloride 95 CO2 25 glucose 93 BUN 26 creatinine 5.44 calcium 10.0 WBC 7.5 hemoglobin 11.6 platelets 158   Objective:  Vital signs in last 24 hours:  Temp:  [97.6 F (36.4 C)-99 F (37.2 C)] 97.6 F (36.4 C) (11/03 0816) Pulse Rate:  [69-77] 74 (11/03 0845) Resp:  [12-17] 15 (11/03 0845) BP: (142-187)/(55-116) 143/81 (11/03 0900) SpO2:  [99 %-100 %] 100 % (11/03 0845)  Weight change:  Filed Weights   07/23/18 1221 07/25/18 0805 07/25/18 1153  Weight: 67.6 kg 69.4 kg 67.5 kg    Intake/Output: I/O last 3 completed shifts: In: 470.1 [P.O.:160; I.V.:210.1; IV Piggyback:100] Out: -    Intake/Output this shift:  Total I/O In: 100 [IV Piggyback:100] Out: 0    alert and oriented in all extremities CVS- RRR no murmurs rubs or gallops JVP not elevated RS- CTA ABD- BS present soft non-distended EXT- no edema AV fistula right thrill and bruit Neurology follows commands moving all extremities   Basic Metabolic Panel: Recent Labs  Lab 07/21/18 0129 07/21/18 0151 07/21/18 1026 07/22/18 1630 07/23/18 0839 07/25/18 0623 07/27/18 0340  NA 136 134* 138 135 136 134* 132*  K 3.7 3.6 3.8 3.5 4.0 3.4* 4.9  CL 96* 95* 94*  --  99 99 95*  CO2 31  --  29  --  25 27 25   GLUCOSE 107* 101* 95 94 122* 90 93  BUN 31* 37* 34*  --  19 21 26*  CREATININE 7.36* 8.10* 8.07*  --  6.01* 5.48* 5.44*  CALCIUM 11.2*  --  11.1*  --  9.8 10.3 10.0  PHOS  --   --  6.4*  --  7.1*  --   --     Liver Function Tests: Recent Labs  Lab 07/21/18 0129 07/21/18 1026 07/23/18 0839  AST 35  --   --   ALT 13  --   --   ALKPHOS 84  --   --   BILITOT  0.8  --   --   PROT 5.1*  --   --   ALBUMIN 2.5* 2.5* 2.7*   No results for input(s): LIPASE, AMYLASE in the last 168 hours. No results for input(s): AMMONIA in the last 168 hours.  CBC: Recent Labs  Lab 07/21/18 0129  07/21/18 1026 07/22/18 1630 07/23/18 0839 07/25/18 0623 07/27/18 0340  WBC 8.6  --  7.7  --  8.2 7.7 7.5  NEUTROABS 5.3  --   --   --   --   --   --   HGB 10.6*   < > 10.9* 12.9* 10.8* 11.0* 11.6*  HCT 32.7*   < > 34.7* 38.0* 32.8* 35.3* 36.5*  MCV 100.0  --  100.0  --  98.5 99.7 98.1  PLT 248  --  264  --  180 167 158   < > = values in this interval not displayed.    Cardiac Enzymes: No results for input(s): CKTOTAL, CKMB, CKMBINDEX, TROPONINI in the last 168 hours.  BNP: Invalid input(s): POCBNP  CBG: Recent Labs  Lab 07/26/18 0845 07/26/18 1208 07/26/18 1627 07/26/18  2151 07/27/18 0742  GLUCAP 100* 127* 176* 123* 43    Microbiology: Results for orders placed or performed during the hospital encounter of 07/20/18  Surgical PCR screen     Status: None   Collection Time: 07/22/18 12:00 AM  Result Value Ref Range Status   MRSA, PCR NEGATIVE NEGATIVE Final   Staphylococcus aureus NEGATIVE NEGATIVE Final    Comment: (NOTE) The Xpert SA Assay (FDA approved for NASAL specimens in patients 55 years of age and older), is one component of a comprehensive surveillance program. It is not intended to diagnose infection nor to guide or monitor treatment. Performed at [facility], [place] [address], [place], [place] [address]     Coagulation Studies: No results for input(s): LABPROT, INR in the last 72 hours.  Urinalysis: No results for input(s): COLORURINE, LABSPEC, PHURINE, GLUCOSEU, HGBUR, BILIRUBINUR, KETONESUR, PROTEINUR, UROBILINOGEN, NITRITE, LEUKOCYTESUR in the last 72 hours.  Invalid input(s): APPERANCEUR    Imaging: No results found.   Medications:   . sodium chloride    . sodium chloride Stopped (07/25/18 0830)  . dextrose  Stopped (07/27/18 0800)  . levETIRAcetam 500 mg (07/27/18 0842)   . Chlorhexidine Gluconate Cloth  6 each Topical Q0600  . docusate sodium  100 mg Oral BID  . donepezil  10 mg Oral QHS  . insulin aspart  0-5 Units Subcutaneous QHS  . insulin aspart  0-9 Units Subcutaneous TID WC  . levothyroxine  50 mcg Oral QAC breakfast  . memantine  10 mg Oral BID  . pantoprazole  40 mg Oral Daily  . polyethylene glycol  17 g Oral Daily  . senna  1 tablet Oral BID  . sevelamer carbonate  800 mg Oral Q supper  . simvastatin  20 mg Oral QHS  . sodium chloride flush  3 mL Intravenous Q12H  . sodium chloride flush  3 mL Intravenous Q12H  . vitamin B-12  1,000 mcg Oral Daily   sodium chloride, acetaminophen **OR** acetaminophen, bisacodyl, diphenhydrAMINE, fentaNYL (SUBLIMAZE) injection, labetalol, ondansetron **OR** ondansetron (ZOFRAN) IV, polyethylene glycol, polyvinyl alcohol, promethazine, sodium chloride flush, sodium phosphate  Assessment/ Plan:  Dialysis Orders: MWF East 4 hrs 180 NRe 400/Autoflow 1.5 71 kg 2.0K/2.0 Ca  R AVF -Heparin 1800 units IV -Mircera 150 mcg IV q 2 weeks (last dose 07/16/18 Last HGB 11.4 07/16/18)  Assessment/Plan: 1.  Bilateral acute on chronic subdural hematoma-status post bur hole placement day #5 drains have been removed 2.  Multiple rib fractures-per primary. Does not seem to be painful.  3.  ESRD -  MWF  . K+ 4.7 removal of 2 L on dialysis 07/25/2018 4.  Hypertension/volume  - Hypertensive at present. Avoid hypotension while on HD. Pre wt 68.9 kg-has been leaving under EDW at OP center. UFG 1.3 liters removed 07/21/2018. 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  5.  Anemia  -hemoglobin 10.9 6.  Metabolic bone disease -calcium levels increased.Marland KitchenNo vitamin D analogs or calcium based binders we will continue to follow. 7. Myalgias seem better this morning 8.  Nutrition -NPO  9.  DM-per primary 9.   Hypothyroidism-per primary 10  Dementia-per primary     LOS: [place] @TODAY @11 :23 AM

## 2018-07-27 NOTE — Progress Notes (Signed)
  NEUROSURGERY PROGRESS NOTE   No issues overnight. Denies headache this am  EXAM:  BP (!) 143/81 (BP Location: Left Arm)   Pulse 74   Temp 97.6 F (36.4 C) (Axillary)   Resp 15   Ht 5\' 11"  (1.803 m)   Wt 67.5 kg   SpO2 100%   BMI 20.75 kg/m   Awake, alert Confused at times MAEW and symmetrically. No focal deficits CN grossly intact  PLAN Stable this am.  No new NS recs. Plan for repeat head CT tomorrow.

## 2018-07-28 ENCOUNTER — Inpatient Hospital Stay (HOSPITAL_COMMUNITY): Payer: Medicare Other

## 2018-07-28 LAB — GLUCOSE, CAPILLARY
Glucose-Capillary: 96 mg/dL (ref 70–99)
Glucose-Capillary: 127 mg/dL — ABNORMAL HIGH (ref 70–99)
Glucose-Capillary: 214 mg/dL — ABNORMAL HIGH (ref 70–99)
Glucose-Capillary: 93 mg/dL (ref 70–99)

## 2018-07-28 LAB — RENAL FUNCTION PANEL
Albumin: 2.4 g/dL — ABNORMAL LOW (ref 3.5–5.0)
Anion gap: 12 (ref 5–15)
BUN: 38 mg/dL — ABNORMAL HIGH (ref 8–23)
CO2: 25 mmol/L (ref 22–32)
Calcium: 10.1 mg/dL (ref 8.9–10.3)
Chloride: 93 mmol/L — ABNORMAL LOW (ref 98–111)
Creatinine, Ser: 7.09 mg/dL — ABNORMAL HIGH (ref 0.61–1.24)
GFR calc Af Amer: 8 mL/min — ABNORMAL LOW (ref >60)
GFR calc non Af Amer: 7 mL/min — ABNORMAL LOW (ref >60)
Glucose, Bld: 135 mg/dL — ABNORMAL HIGH (ref 70–99)
Phosphorus: 5 mg/dL — ABNORMAL HIGH (ref 2.5–4.6)
Potassium: 4.1 mmol/L (ref 3.5–5.1)
Sodium: 130 mmol/L — ABNORMAL LOW (ref 135–145)

## 2018-07-28 LAB — CBC
HCT: 34.4 % — ABNORMAL LOW (ref 39.0–52.0)
Hemoglobin: 11.4 g/dL — ABNORMAL LOW (ref 13.0–17.0)
MCH: 31.8 pg (ref 26.0–34.0)
MCHC: 33.1 g/dL (ref 30.0–36.0)
MCV: 96.1 fL (ref 80.0–100.0)
Platelets: 190 10*3/uL (ref 150–400)
RBC: 3.58 MIL/uL — ABNORMAL LOW (ref 4.22–5.81)
RDW: 15.1 % (ref 11.5–15.5)
WBC: 9.1 10*3/uL (ref 4.0–10.5)
nRBC: 0 % (ref 0.0–0.2)

## 2018-07-28 MED ORDER — NEPRO/CARBSTEADY PO LIQD
237.0000 mL | Freq: Two times a day (BID) | ORAL | Status: DC
  Administered 2018-07-28: 237 mL via ORAL
  Filled 2018-07-28 (×4): qty 237

## 2018-07-28 MED ORDER — HEPARIN SODIUM (PORCINE) 1000 UNIT/ML DIALYSIS: 1000.0000 [IU] | INTRAMUSCULAR | Status: DC | PRN

## 2018-07-28 MED ORDER — PENTAFLUOROPROP-TETRAFLUOROETH EX AERO: 1.0000 "application " | INHALATION_SPRAY | CUTANEOUS | Status: DC | PRN

## 2018-07-28 MED ORDER — LIDOCAINE HCL (PF) 1 % IJ SOLN: 5.0000 mL | INTRAMUSCULAR | Status: DC | PRN

## 2018-07-28 MED ORDER — SODIUM CHLORIDE 0.9 % IV SOLN: 100.0000 mL | INTRAVENOUS | Status: DC | PRN

## 2018-07-28 MED ORDER — PRO-STAT SUGAR FREE PO LIQD
30.0000 mL | Freq: Two times a day (BID) | ORAL | Status: DC
  Administered 2018-07-28 (×2): 30 mL via ORAL
  Filled 2018-07-28 (×2): qty 30
  Filled 2018-07-28: qty 2880

## 2018-07-28 MED ORDER — RENA-VITE PO TABS
1.0000 | ORAL_TABLET | Freq: Every day | ORAL | Status: DC
  Administered 2018-07-28: 1 via ORAL
  Filled 2018-07-28 (×2): qty 1

## 2018-07-28 MED ORDER — LIDOCAINE-PRILOCAINE 2.5-2.5 % EX CREA: 1.0000 "application " | TOPICAL_CREAM | CUTANEOUS | Status: DC | PRN

## 2018-07-28 NOTE — Progress Notes (Signed)
PT Cancellation Note  Patient Details Name: [name] MRN: [id number] DOB: [birth year]   Cancelled Treatment:    Reason Eval/Treat Not Completed: Patient at procedure or test/unavailable(HD)   [name] B [name] 07/28/2018, 9:07 AM  [name], PT Acute Rehabilitation Services Pager: [phone] Office: [phone]

## 2018-07-28 NOTE — Progress Notes (Signed)
[facility] PROGRESS NOTE  [name] ZOX:[id number] DOB: 1941-01-18 DOA: 07/20/2018  PCP: Elby Showers, MD  Brief History/Interval Summary: 77 y.o.malewith medical history significant fortype 2 diabetes mellitus, hypothyroidism, dementia, and end-stage renal disease on hemodialysis, presented to the emergency department with increased confusion. Patient was admitted last month with GI bleeding and generalized weakness, was transfused, but has continued to experience generalized weakness and his wife has noted some difficulty with gait and reports that he has fallen multiple times over the last few months.  Evaluation was concerning for acute on chronic subdural hematoma.  Patient was hospitalized for further management.  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  Nephrology.  Procedures: Bilateral burr holes for drainage of subdural hematomas  Antibiotics: None  Subjective/Interval History: Patient denies any complaints.  The soreness in both his thighs have improved.  Still persists though.  Denies any headaches.  His niece is at the bedside.    Objective:  Vital Signs  Vitals:   07/28/18 0900 07/28/18 0915 07/28/18 0930 07/28/18 0945  BP: (!) 188/89 (!) 158/77 (!) 158/74 128/65  Pulse: 73 77 75 72  Resp:      Temp:      TempSrc:      SpO2:      Weight:      Height:        Intake/Output Summary (Last 24 hours) at 07/28/2018 0959 Last data filed at 07/27/2018 1908 Gross per 24 hour  Intake 460 ml  Output 25 ml  Net 435 ml   Filed Weights   07/25/18 0805 07/25/18 1153 07/28/18 0835  Weight: 69.4 kg 67.5 kg 67.4 kg    General appearance: Awake alert.  In no distress Resp: Normal effort.  Clear to auscultation bilaterally. Cardio: S1-S2 is normal regular.  No S3-S4.  No rubs murmurs or bruit GI: Abdomen is soft.  Nontender nondistended Extremities: No deformity or abnormality noted over the thigh area  in both lower extremities.  Able to lift both his legs.  Good peripheral pulses.  Neurologic: No obvious focal neurological deficits.  Lab Results:  Data Reviewed: I have personally reviewed following labs and imaging studies  CBC: Recent Labs  Lab 07/22/18 1630 07/23/18 0839 07/25/18 0623 07/27/18 0340 07/28/18 0830  WBC  --  8.2 7.7 7.5 9.1  HGB 12.9* 10.8* 11.0* 11.6* 11.4*  HCT 38.0* 32.8* 35.3* 36.5* 34.4*  MCV  --  98.5 99.7 98.1 96.1  PLT  --  180 167 158 811    Basic Metabolic Panel: Recent Labs  Lab 07/22/18 1630 07/23/18 0839 07/25/18 0623 07/27/18 0340 07/28/18 0830  NA 135 136 134* 132* 130*  K 3.5 4.0 3.4* 4.9 4.1  CL  --  99 99 95* 93*  CO2  --  25 27 25 25   GLUCOSE 94 122* 90 93 135*  BUN  --  19 21 26* 38*  CREATININE  --  6.01* 5.48* 5.44* 7.09*  CALCIUM  --  9.8 10.3 10.0 10.1  PHOS  --  7.1*  --   --  5.0*    GFR: Estimated Creatinine Clearance: 8.3 mL/min (A) (by C-G formula based on SCr of 7.09 mg/dL (H)).  Liver Function Tests: Recent Labs  Lab 07/23/18 0839 07/28/18 0830  ALBUMIN 2.7* 2.4*    Coagulation Profile: Recent Labs  Lab 07/22/18 0612  INR 0.97    CBG: Recent Labs  Lab 07/27/18 0742 07/27/18 1305 07/27/18 1459 07/27/18 2125  07/28/18 0757  GLUCAP 88 156* 178* 101* 96     Recent Results (from the past 240 hour(s))  Surgical PCR screen     Status: None   Collection Time: 07/22/18 12:00 AM  Result Value Ref Range Status   MRSA, PCR NEGATIVE NEGATIVE Final   Staphylococcus aureus NEGATIVE NEGATIVE Final    Comment: (NOTE) The Xpert SA Assay (FDA approved for NASAL specimens in patients 32 years of age and older), is one component of a comprehensive surveillance program. It is not intended to diagnose infection nor to guide or monitor treatment. Performed at [facility], [place] [address], [place], [place] [address]       Radiology Studies: No results found.   Medications:  Scheduled: .  Chlorhexidine Gluconate Cloth  6 each Topical Q0600  . docusate sodium  100 mg Oral BID  . donepezil  10 mg Oral QHS  . insulin aspart  0-5 Units Subcutaneous QHS  . insulin aspart  0-9 Units Subcutaneous TID WC  . levothyroxine  50 mcg Oral QAC breakfast  . memantine  10 mg Oral BID  . pantoprazole  40 mg Oral Daily  . polyethylene glycol  17 g Oral Daily  . senna  1 tablet Oral BID  . sevelamer carbonate  800 mg Oral Q supper  . simvastatin  20 mg Oral QHS  . sodium chloride flush  3 mL Intravenous Q12H  . sodium chloride flush  3 mL Intravenous Q12H  . vitamin B-12  1,000 mcg Oral Daily   Continuous: . sodium chloride    . sodium chloride Stopped (07/25/18 0830)  . sodium chloride    . sodium chloride    . dextrose Stopped (07/27/18 0800)  . 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  Patient underwent burr hole on 10/29.  Patient had drains which have been out since 10/31.  Plan is to repeat a CT scan of his head today.  Patient remains on Keppra.  Mental status has improved and stable.  Further management per neurosurgery.   Multiple rib fractures Respiratory status appears to be stable.  Incentive spirometry.  Elevated blood pressure Patient's blood pressure noted to be elevated this morning.  Patient to be dialyzed today.  Apparently he has been on midodrine at home due to hypotension.  Hence we do not want to aggressively treat this high blood pressure if it gets controlled with hemodialysis.  Will defer to  nephrology.  Currently not on midodrine.   End-stage renal disease on dialysis on Monday Wednesday Friday Management per nephrology.  To be dialyzed today.  Discomfort bilateral thighs Poor historian.  However appears to be present mainly when he tries to lift his legs up.  Examination did not show any limitation in movement of his hip joints or knee joints.  No abnormality noted over the thighs.  Good peripheral pulses.  He does have low back pain which is chronic.  He is noted to be on a statin but he has been on it for a long time.  Discussed with Dr. Justin Mend.  Continue with physical therapy. If this symptom continues to interfere with his ability to ambulate then could consider discontinuing the statin.  History of dementia Patient on Aricept and Namenda at home.  Seen by speech therapy.  On regular diet.  Hypothyroidism Continue home medications.  Diabetes mellitus type 2 HbA1c 5.2.  Continue SSI.  CBGs are stable.  Anemia of chronic disease Hemoglobin has been stable.  No evidence of overt bleeding.  DVT Prophylaxis: SCDs    Code Status: Full code Family Communication: Discussed with the patient and his niece who was at the bedside. Disposition Plan: Patient being considered for inpatient rehabilitation.  CT scan of the head is pending from today.  Also await further input from neurosurgery.    LOS: 7 days   [facility] Pager [phone] 07/28/2018, 9:59 AM  If 7PM-7AM, please contact night-coverage at [email or web address], password [facility]

## 2018-07-28 NOTE — Progress Notes (Signed)
Per 3rd shift patient was set to go to CT around 0600 and they did not come.   Patient left for dialysis approx 0830.   Spoke with [facility] [place] [facility] and Elsner patient CT will be complete after he returns from dialysis and the plan of care will depend on results.

## 2018-07-28 NOTE — PMR Pre-admission (Signed)
PMR Admission Coordinator Pre-Admission Assessment  Patient: [name] is an 77 y.o., male MRN: [id number] DOB: 1941/02/12 Height: 5' 11"  (180.3 cm) Weight: 63.9 kg              Insurance Information HMO:     PPO:      PCP:      IPA:      80/20:      OTHER:  PRIMARY: Medicare A and B      Policy#: [id number]      Subscriber: Patient CM Name:    Phone#:     Fax#:  Pre-Cert#:       Employer:  Benefits:  Phone #: NA      Name: verified online via [employer] on 07/28/18 Eff. Date: Part A effective: 07/25/06 Part B effective 07/25/06     Deduct: $1,364      Out of Pocket Max:       Life Max:  CIR: Covered per Medicare Guidelines once yearly deductible is met      SNF: days 1-20, 100%, days 21-100, 80% Outpatient: 80%     Co-Pay: 20% Home Health: 100%      Co-Pay:  DME: 80%     Co-Pay: 20% Providers:  SECONDARY: [facility]      Policy#: [id number]      Subscriber: Patient CM Name:       Phone#: [phone]     Fax#:  Pre-Cert#:       Employer:  Benefits:  Phone #:      Name:  Eff. Date:      Deduct:       Out of Pocket Max:       Life Max:  CIR:       SNF:  Outpatient:      Co-Pay:  Home Health:       Co-Pay:  DME:      Co-Pay:   Medicaid Application Date:       Case Manager: Disability Application Date:       Case Worker:   Emergency Contact Information Contact Information    Name Relation Home Work Mobile   Schweers,Shirley Spouse [phone]  [phone]   [place],Crystal Daughter [phone]  [phone]   Summers,Tammy Niece [phone]  [phone]     Current Medical History  Patient Admitting Diagnosis: traumatic SDH's History of Present Illness:  [name] is a 78 y.o. male with history of ESRD-hemodialysis MWF, T2DM, recent GIB due to AVM, chronic hypotension, dementia, OSA; who was admitted to [facility] on 07/20/2018 with worsening of confusion headaches and multiple falls.  He was found to have acute on chronic large bilateral subdural hemorrhages as well  as multiple left rib fractures.  He was evaluated by Dr. Ellene Route and underwent bilateral bur hole evacuation of SDH on 10/29.  Swallow function within normal limits and patient on regular textures.  Therapy ongoing and patient is showing improvement in balance as well as improvement in ability to follow commands.  CIR recommended due to functional deficits. Pt underwent repeat CT scan on 11/4, with reduction in size of both subdurals; Neurosurgery has recommended intermittant CT scans. Staples to be removed tomorrow. Pt will be admitted to CIR on 07/29/18.       Past Medical History  Past Medical History:  Diagnosis Date  . Allergy   . Anemia   . Arthritis   . Cataract    bil cateracts removed  . Coronary artery disease   . Diabetes mellitus  Type 2  . Diverticulitis   . ED (erectile dysfunction)   . 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 ESRD (end stage renal disease) on dialysis ([place]) 03/2015  . GERD (gastroesophageal reflux disease)    pepto   . Gout   . Hyperlipidemia   . Hypertension   . Hypothyroidism   . Pneumonia   . PVD (peripheral vascular disease) ([place])    has plastic aorta  . Renal insufficiency   . Seasonal allergies   . Shortness of breath dyspnea   . Sleep apnea    does not wear c-pap  . Thyroid disease     Family History  family history includes Aneurysm in his mother; Diabetes in his father; Heart disease in his father; Hypertension in his father; Stroke in his father.  Prior Rehab/Hospitalizations:  Has the patient had major surgery during 100 days prior to admission? Yes  Current Medications   Current Facility-Administered Medications:  .  0.9 %  sodium chloride infusion, 250 mL, Intravenous, PRN, Kristeen Miss, MD .  0.9 %  sodium chloride infusion, , Intravenous, Continuous, Kristeen Miss, MD, Stopped at 07/25/18 0830 .  acetaminophen (TYLENOL) tablet 650 mg, 650 mg, Oral, Q6H PRN, 650 mg at 07/28/18 2148 **OR** acetaminophen (TYLENOL) suppository 650 mg,  650 mg, Rectal, Q6H PRN, Kristeen Miss, MD .  bisacodyl (DULCOLAX) suppository 10 mg, 10 mg, Rectal, Daily PRN, Kristeen Miss, MD .  Chlorhexidine Gluconate Cloth 2 % PADS 6 each, 6 each, Topical, Q0600, Edrick Oh, MD, 6 each at 07/29/18 0546 .  [START ON 07/30/2018] Chlorhexidine Gluconate Cloth 2 % PADS 6 each, 6 each, Topical, Q0600, Justin Mend, MD .  cinacalcet (SENSIPAR) tablet 30 mg, 30 mg, Oral, Q supper, Justin Mend, MD .  dextrose 10 % infusion, , Intravenous, Continuous, Kristeen Miss, MD, Stopped at 07/27/18 0800 .  diphenhydrAMINE (BENADRYL) capsule 25 mg, 25 mg, Oral, Q6H PRN, Ashok Pall, MD, 25 mg at 07/28/18 2148 .  docusate sodium (COLACE) capsule 100 mg, 100 mg, Oral, BID, Kristeen Miss, MD, 100 mg at 07/29/18 0936 .  donepezil (ARICEPT) tablet 10 mg, 10 mg, Oral, QHS, Kristeen Miss, MD, 10 mg at 07/28/18 2148 .  feeding supplement (NEPRO CARB STEADY) liquid 237 mL, 237 mL, Oral, BID BM, Bergman, Martha, PA-C, 237 mL at 07/28/18 1410 .  feeding supplement (PRO-STAT SUGAR FREE 64) liquid 30 mL, 30 mL, Oral, BID, Alric Seton, PA-C, 30 mL at 07/28/18 2148 .  fentaNYL (SUBLIMAZE) injection 12.5-25 mcg, 12.5-25 mcg, Intravenous, Q2H PRN, Kristeen Miss, MD, 25 mcg at 07/28/18 2147 .  insulin aspart (novoLOG) injection 0-9 Units, 0-9 Units, Subcutaneous, TID WC, Kristeen Miss, MD, 3 Units at 07/28/18 1722 .  labetalol (NORMODYNE,TRANDATE) injection 10-40 mg, 10-40 mg, Intravenous, Q10 min PRN, Kristeen Miss, MD, 10 mg at 07/27/18 2105 .  levETIRAcetam (KEPPRA) tablet 500 mg, 500 mg, Oral, BID, Bonnielee Haff, MD .  levothyroxine (SYNTHROID, LEVOTHROID) tablet 50 mcg, 50 mcg, Oral, QAC breakfast, Kristeen Miss, MD, 50 mcg at 07/29/18 0937 .  memantine (NAMENDA) tablet 10 mg, 10 mg, Oral, BID, Kristeen Miss, MD, 10 mg at 07/29/18 0263 .  multivitamin (RENA-VIT) tablet 1 tablet, 1 tablet, Oral, QHS, Alric Seton, PA-C, 1 tablet at 07/28/18 2148 .  ondansetron  (ZOFRAN) tablet 4 mg, 4 mg, Oral, Q4H PRN **OR** ondansetron (ZOFRAN) injection 4 mg, 4 mg, Intravenous, Q4H PRN, Kristeen Miss, MD .  pantoprazole (PROTONIX) EC tablet 40 mg, 40 mg, Oral, Daily, Kristeen Miss, MD, 40  mg at 07/29/18 0936 .  polyethylene glycol (MIRALAX / GLYCOLAX) packet 17 g, 17 g, Oral, Daily, Kristeen Miss, MD, 17 g at 07/27/18 1228 .  polyethylene glycol (MIRALAX / GLYCOLAX) packet 17 g, 17 g, Oral, Daily PRN, Kristeen Miss, MD .  polyvinyl alcohol (LIQUIFILM TEARS) 1.4 % ophthalmic solution 1 drop, 1 drop, Both Eyes, PRN, Elsner, Henry, MD .  promethazine (PHENERGAN) tablet 12.5-25 mg, 12.5-25 mg, Oral, Q4H PRN, Kristeen Miss, MD, 25 mg at 07/28/18 2148 .  senna (SENOKOT) tablet 8.6 mg, 1 tablet, Oral, BID, Kristeen Miss, MD, 8.6 mg at 07/29/18 0936 .  sevelamer carbonate (RENVELA) tablet 800 mg, 800 mg, Oral, Q supper, Kristeen Miss, MD, 800 mg at 07/28/18 1722 .  simvastatin (ZOCOR) tablet 20 mg, 20 mg, Oral, QHS, Kristeen Miss, MD, 20 mg at 07/28/18 2148 .  sodium chloride flush (NS) 0.9 % injection 3 mL, 3 mL, Intravenous, Q12H, Kristeen Miss, MD, 3 mL at 07/29/18 0940 .  sodium chloride flush (NS) 0.9 % injection 3 mL, 3 mL, Intravenous, Q12H, Kristeen Miss, MD, 3 mL at 07/29/18 0937 .  sodium chloride flush (NS) 0.9 % injection 3 mL, 3 mL, Intravenous, PRN, Kristeen Miss, MD .  sodium phosphate (FLEET) 7-19 GM/118ML enema 1 enema, 1 enema, Rectal, Once PRN, Kristeen Miss, MD .  vitamin B-12 (CYANOCOBALAMIN) tablet 1,000 mcg, 1,000 mcg, Oral, Daily, Kristeen Miss, MD, 1,000 mcg at 07/29/18 9379  Patients Current Diet:  Diet Order            Diet renal/carb modified with fluid restriction Diet-HS Snack? Nothing; Fluid restriction: 1200 mL Fluid; Room service appropriate? Yes; Fluid consistency: Thin  Diet effective now              Precautions / Restrictions Precautions Precautions: Fall Restrictions Weight Bearing Restrictions: No   Has the patient had 2 or  more falls or a fall with injury in the past year?Yes  Prior Activity Level Limited Community (1-2x/wk): would go out for dialysis using wc assessible Lucianne Lei; homebound for majority of the time  [employer] / Equipment    Prior Device Use: Indicate devices/aids used by the patient prior to current illness, exacerbation or injury? Manual wheelchair and Walker  Prior Functional Level Prior Function Comments: unclear  Self Care: Did the patient need help bathing, dressing, using the toilet or eating?  Needed some help  Indoor Mobility: Did the patient need assistance with walking from room to room (with or without device)? Independent  Stairs: Did the patient need assistance with internal or external stairs (with or without device)? Needed some help  Functional Cognition: Did the patient need help planning regular tasks such as shopping or remembering to take medications? Needed some help  Current Functional Level Cognition  Overall Cognitive Status: No family/caregiver present to determine baseline cognitive functioning Current Attention Level: Sustained Orientation Level: Oriented to person, Oriented to place, Disoriented to time, Disoriented to situation Following Commands: Follows one step commands consistently Safety/Judgement: Decreased awareness of deficits General Comments: pt oriented to place, day, situation and responding appropriately throughout session    Extremity Assessment (includes Sensation/Coordination)  Upper Extremity Assessment: Defer to OT evaluation  Lower Extremity Assessment: Generalized weakness, Difficult to assess due to impaired cognition(moves both LEs through full range)    ADLs       Mobility  Overal bed mobility: Needs Assistance Bed Mobility: Supine to Sit Supine to sit: Min assist Sit to supine: Min assist General bed mobility comments: min  HHA to elevate trunk from surface, increased time    Transfers  Overall transfer level:  Needs assistance Equipment used: Rolling walker (2 wheeled) Transfers: Sit to/from Stand Sit to Stand: Min guard General transfer comment: minguard to rise from bed and chair without armrests    Ambulation / Gait / Stairs / Wheelchair Mobility  Ambulation/Gait Ambulation/Gait assistance: Mod assist Gait Distance (Feet): 90 Feet Assistive device: Rolling walker (2 wheeled), 1 person hand held assist Gait Pattern/deviations: Trunk flexed, Shuffle General Gait Details: pt with very short shuffling steps today, tendency for posterior bias with flexed trunk and too posterior to walker. Pt walked 10' then reported dizziness and requested to sit. BP stable. 2nd trial walked 70' with max cues for posture, position in RW with assist to direct and control RW as well as maintain anterior translation. Pt complaining of Rt wrist pain and attempted 10' without RW with HHA but pt continues to require mod assist and reach out for environment with free hand Gait velocity: decreased Gait velocity interpretation: <1.31 ft/sec, indicative of household ambulator    Posture / Balance Balance Overall balance assessment: Needs assistance, History of Falls Sitting-balance support: Feet supported Sitting balance-Leahy Scale: Fair Postural control: Posterior lean Standing balance support: Bilateral upper extremity supported, During functional activity Standing balance-Leahy Scale: Poor    Special needs/care consideration BiPAP/CPAP: no CPM: no Continuous Drip IV: dextrose 10% infusion, 0.9% sodium chloride infusion Dialysis: yes MWF        Life Vest Oxygen: no Special Bed: no Trach Size: no Wound Vac (area): no      Location: no Skin: right knee abrasion, right and left heels with cracking skin, ecchymosis to shoulders and hands; bilateral head incisions.                      Bowel mgmt:last BM: 07/28/18; continent Bladder mgmt: urinal, continent; per wife, infrequently makes urine  Diabetic mgmt: yes      Previous Home Environment Living Arrangements: Spouse/significant other Type of Home: House Home Layout: Two level Bathroom Shower/Tub: Tub/shower unit Additional Comments: history taken from chart review, patient only alert/ oriented to self so unreliable historian at this time  Discharge Living Setting Plans for Discharge Living Setting: Patient's home, Lives with (comment)(lives with wife) Type of Home at Discharge: House Discharge Home Layout: Two level Alternate Level Stairs-Rails: None(NA lives on first floor) Alternate Level Stairs-Number of Steps: (NA) Discharge Home Access: Stairs to enter Entrance Stairs-Rails: Can reach both Entrance Stairs-Number of Steps: 4 Discharge Bathroom Shower/Tub: Walk-in shower Discharge Bathroom Toilet: Standard Discharge Bathroom Accessibility: Yes How Accessible: Accessible via wheelchair, Accessible via walker(somewhat accessible; wc can get halfway to commode) Does the patient have any problems obtaining your medications?: No  Social/Family/Support Systems Patient Roles: Spouse Contact Information: wife is emergency contact: [phone] Anticipated Caregiver: wife and daugther can assist PRN (but lives in different city and can commute if assist planned) Anticipated Ambulance person Information: see above Ability/Limitations of Caregiver: supervision/Min A Caregiver Availability: 24/7 Discharge Plan Discussed with Primary Caregiver: Yes(pt and wife) Is Caregiver In Agreement with Plan?: Yes Does Caregiver/Family have Issues with Lodging/Transportation while Pt is in Rehab?: No   Goals/Additional Needs Patient/Family Goal for Rehab: PT/OT: supervision; SLP: Sup/Min A Expected length of stay: 13-18 days Cultural Considerations: NA Dietary Needs: renal/carb modified with fluid restriction (1200 mL fluid); thin liquids Equipment Needs: TBD Pt/Family Agrees to Admission and willing to participate: Yes Program Orientation Provided  & Reviewed with Pt/Caregiver Including  Roles  & Responsibilities: Yes(pt and his wife)  Barriers to Discharge: Home environment access/layout  Barriers to Discharge Comments: 4 steps to enter; bathroom somewhat accessible for wc.    Decrease burden of Care through IP rehab admission: NA   Possible need for SNF placement upon discharge: not anticipated; pt has good social support at DC. Pt has good prognosis for further progress.    Patient Condition: This patient's medical and functional status has changed since the consult dated: 07/25/18 in which the Rehabilitation Physician determined and documented that the patient's condition is appropriate for intensive rehabilitative care in an inpatient rehabilitation facility. See "History of Present Illness" (above) for medical update. Functional changes are: improvement in functional transfer status from [place] A/Min G to Min G; However, ambulation distance has slightly decreased from Mod A 120 feet to Mod A 90 feet. Patient's medical and functional status update has been discussed with the Rehabilitation physician and patient remains appropriate for inpatient rehabilitation. Will admit to inpatient rehab today.  Preadmission Screen Completed By:  Jhonnie Garner, 07/29/2018 4:05 PM ______________________________________________________________________   Discussed status with Dr. Naaman Plummer on 07/29/18 at 4:04PM and received telephone approval for admission today.  Admission Coordinator:  Jhonnie Garner, time 4:04PM/Date 07/29/18

## 2018-07-28 NOTE — Progress Notes (Signed)
Patient voices frustration with being admitted in the hospital. Patient states "I want to go home" patient refusing to eat dinner, but compliant with nursing and tech care.

## 2018-07-28 NOTE — Progress Notes (Signed)
[facility] Progress Note   Dialysis Orders: Dialysis Orders:MWF [place] right AVF 4 hrs 180 NRe 400/Autoflow 1.5 71 kg 2.0K/2.0 Ca  R AVF -Heparin 1800 units IV -Mircera 150 mcg IV q 2 weeks (last dose 07/16/18 Last HGB 11.4 07/16/18)  Assessment/Plan: 1. 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 ESRD - MWF K 4.1 - has been running on 2 K bath - changed to 4 K for the remainder of HD - Encouraged to run full tmt  3. Anemia - hgb 11.4 stable  4. Secondary hyperparathyroidism - Ca 10.1 - running on 2.5 bath - changed to 2.25 with the 4 K bath - should run on 2 Ca bath P 5 5. HTN/volume - BP high early - coming down with volume removal on HD/currently no midodrine at present- trend for now. 6. Nutrition - tolerating solids alb 2.4 - added supplements and multivit 7. Disp - for transfer to CIR 8. Dementia - baseline but  functional prior to acute event. 9. Multiple rib fx - stable 10. DM/hypothyroidism - meds per primary  Myriam Jacobson, PA-C [place] [phone] 07/28/2018,11:09 AM  LOS: 7 days   Subjective:   Wants to go home.  C/o being cold on HD. Offered warm blanket. Recognizes me from outpatient HD.  Objective Vitals:   07/28/18 0915 07/28/18 0930 07/28/18 0945 07/28/18 1000  BP: (!) 158/77 (!) 158/74 128/65 112/61  Pulse: 77 75 72 76  Resp:      Temp:      TempSrc:      SpO2:      Weight:      Height:       Physical Exam goal 3.5 General: NAD breathing easily supine on HD Heart: RRR Lungs: no rales Abdomen: soft NT Extremities: no LE Dialysis Access: right AVF Qb 400   Additional Objective Labs: Basic Metabolic Panel: Recent Labs  Lab 07/23/18 0839 07/25/18 0623 07/27/18 0340 07/28/18 0830  NA 136 134* 132* 130*  K 4.0 3.4* 4.9 4.1  CL 99 99 95* 93*  CO2 25 27 25 25   GLUCOSE 122* 90 93 135*  BUN 19 21 26* 38*  CREATININE 6.01* 5.48* 5.44* 7.09*  CALCIUM 9.8  10.3 10.0 10.1  PHOS 7.1*  --   --  5.0*   Liver Function Tests: Recent Labs  Lab 07/23/18 0839 07/28/18 0830  ALBUMIN 2.7* 2.4*   No results for input(s): LIPASE, AMYLASE in the last 168 hours. CBC: Recent Labs  Lab 07/23/18 0839 07/25/18 0623 07/27/18 0340 07/28/18 0830  WBC 8.2 7.7 7.5 9.1  HGB 10.8* 11.0* 11.6* 11.4*  HCT 32.8* 35.3* 36.5* 34.4*  MCV 98.5 99.7 98.1 96.1  PLT 180 167 158 190   Blood Culture    Component Value Date/Time   SDES URINE, CATHETERIZED 01/23/2018 2043   [place] NONE 01/23/2018 2043   CULT  01/23/2018 2043    NO GROWTH Performed at [facility] [address], [place], [place] [address]    REPTSTATUS 01/25/2018 FINAL 01/23/2018 2043    Cardiac Enzymes: No results for input(s): CKTOTAL, CKMB, CKMBINDEX, TROPONINI in the last 168 hours. CBG: Recent Labs  Lab 07/27/18 0742 07/27/18 1305 07/27/18 1459 07/27/18 2125 07/28/18 0757  GLUCAP 88 156* 178* 101* 96   Iron Studies: No results for input(s): IRON, TIBC, TRANSFERRIN, FERRITIN in the last 72 hours. Lab Results  Component Value Date   INR 0.97  07/22/2018   INR 1.01 06/05/2016   Studies/Results: No results found. Medications: . sodium chloride    . sodium chloride Stopped (07/25/18 0830)  . sodium chloride    . sodium chloride    . dextrose Stopped (07/27/18 0800)  . levETIRAcetam 500 mg (07/27/18 2102)   . Chlorhexidine Gluconate Cloth  6 each Topical Q0600  . docusate sodium  100 mg Oral BID  . donepezil  10 mg Oral QHS  . insulin aspart  0-5 Units Subcutaneous QHS  . insulin aspart  0-9 Units Subcutaneous TID WC  . levothyroxine  50 mcg Oral QAC breakfast  . memantine  10 mg Oral BID  . pantoprazole  40 mg Oral Daily  . polyethylene glycol  17 g Oral Daily  . senna  1 tablet Oral BID  . sevelamer carbonate  800 mg Oral Q supper  . simvastatin  20 mg Oral QHS  . sodium chloride flush  3 mL Intravenous Q12H  . sodium chloride flush  3 mL Intravenous  Q12H  . vitamin B-12  1,000 mcg Oral Daily

## 2018-07-28 NOTE — Progress Notes (Signed)
Patient came back from CT more confused and aggravated stating that he "wants to go home". Refused supper but Nurse supplemented with graham crackers and peanut butter.

## 2018-07-28 NOTE — Progress Notes (Signed)
Inpatient Rehabilitation-Admissions Coordinator    Met with patient at the bedside to discuss team's recommendation for inpatient rehabilitation. Per pt request, AC spoke with pt's wife for consent. [facility] Shared booklets, expectations while in CIR, expected length of stay, and anticipated functional level at DC with wife. Wife wanting to pursue CIR at this time and provided consent.  Noted plan for CT today. Will await results prior to pursuing CIR admission.   Will follow up tomorrow for potential admit, pending medical clearance.   Please call if questions.   Jhonnie Garner, OTR/L  Rehab Admissions Coordinator  [phone] 07/28/2018 4:26 PM

## 2018-07-28 NOTE — Progress Notes (Signed)
Patients spouse called to verbalize concern that patient was calling 911 from his cell phone confused as to where he was. Spouse, [name] request that patients cell phone be taken away from him, I told her that charge RN had turned pts phone off and placed it in the bedside table. I also encouraged her to come and stay with the patient as patient verbalized concern about her not being here with him. Patients spouse requesting tests results over the phone, explained to her that I cannot give test results to her, she states understanding. Patient resting in bed at this time, pain and nausea addressed. Patient states he did call the police because he needed to find out where he was. I will continue to monitor and treat pt per MD orders.

## 2018-07-28 NOTE — Progress Notes (Signed)
Per dialysis patient completed the whole 4 hours of dialysis and had 2 liters pulled off.

## 2018-07-29 ENCOUNTER — Other Ambulatory Visit: Payer: Self-pay

## 2018-07-29 ENCOUNTER — Encounter (HOSPITAL_COMMUNITY): Payer: Self-pay

## 2018-07-29 ENCOUNTER — Inpatient Hospital Stay (HOSPITAL_COMMUNITY)
Admission: RE | Admit: 2018-07-29 | Discharge: 2018-08-13 | DRG: 091 | Disposition: A | Discharge status: Home Health | Payer: Medicare Other | Source: Intra-hospital | Attending: Physical Medicine & Rehabilitation | Admitting: Physical Medicine & Rehabilitation

## 2018-07-29 DIAGNOSIS — E1122 Type 2 diabetes mellitus with diabetic chronic kidney disease: Secondary | ICD-10-CM | POA: Diagnosis not present

## 2018-07-29 DIAGNOSIS — X58XXXS Exposure to other specified factors, sequela: Secondary | ICD-10-CM | POA: Diagnosis present

## 2018-07-29 DIAGNOSIS — T8249XA Other complication of vascular dialysis catheter, initial encounter: Secondary | ICD-10-CM

## 2018-07-29 DIAGNOSIS — Z992 Dependence on renal dialysis: Secondary | ICD-10-CM | POA: Diagnosis not present

## 2018-07-29 DIAGNOSIS — D72829 Elevated white blood cell count, unspecified: Secondary | ICD-10-CM | POA: Diagnosis not present

## 2018-07-29 DIAGNOSIS — Z823 Family history of stroke: Secondary | ICD-10-CM

## 2018-07-29 DIAGNOSIS — E119 Type 2 diabetes mellitus without complications: Secondary | ICD-10-CM

## 2018-07-29 DIAGNOSIS — K219 Gastro-esophageal reflux disease without esophagitis: Secondary | ICD-10-CM | POA: Diagnosis present

## 2018-07-29 DIAGNOSIS — M25531 Pain in right wrist: Secondary | ICD-10-CM | POA: Diagnosis present

## 2018-07-29 DIAGNOSIS — N186 End stage renal disease: Secondary | ICD-10-CM | POA: Diagnosis not present

## 2018-07-29 DIAGNOSIS — I2510 Atherosclerotic heart disease of native coronary artery without angina pectoris: Secondary | ICD-10-CM | POA: Diagnosis present

## 2018-07-29 DIAGNOSIS — R296 Repeated falls: Secondary | ICD-10-CM | POA: Diagnosis present

## 2018-07-29 DIAGNOSIS — Z6821 Body mass index (BMI) 21.0-21.9, adult: Secondary | ICD-10-CM

## 2018-07-29 DIAGNOSIS — D631 Anemia in chronic kidney disease: Secondary | ICD-10-CM | POA: Diagnosis present

## 2018-07-29 DIAGNOSIS — R195 Other fecal abnormalities: Secondary | ICD-10-CM

## 2018-07-29 DIAGNOSIS — Z833 Family history of diabetes mellitus: Secondary | ICD-10-CM

## 2018-07-29 DIAGNOSIS — R0600 Dyspnea, unspecified: Secondary | ICD-10-CM | POA: Diagnosis present

## 2018-07-29 DIAGNOSIS — D638 Anemia in other chronic diseases classified elsewhere: Secondary | ICD-10-CM

## 2018-07-29 DIAGNOSIS — F0390 Unspecified dementia without behavioral disturbance: Secondary | ICD-10-CM | POA: Diagnosis present

## 2018-07-29 DIAGNOSIS — S065XAA Traumatic subdural hemorrhage with loss of consciousness status unknown, initial encounter: Secondary | ICD-10-CM | POA: Diagnosis present

## 2018-07-29 DIAGNOSIS — S069X0S Unspecified intracranial injury without loss of consciousness, sequela: Secondary | ICD-10-CM

## 2018-07-29 DIAGNOSIS — K580 Irritable bowel syndrome with diarrhea: Secondary | ICD-10-CM | POA: Diagnosis present

## 2018-07-29 DIAGNOSIS — Z880 Allergy status to penicillin: Secondary | ICD-10-CM

## 2018-07-29 DIAGNOSIS — E039 Hypothyroidism, unspecified: Secondary | ICD-10-CM | POA: Diagnosis present

## 2018-07-29 DIAGNOSIS — Z87891 Personal history of nicotine dependence: Secondary | ICD-10-CM

## 2018-07-29 DIAGNOSIS — Z9842 Cataract extraction status, left eye: Secondary | ICD-10-CM | POA: Diagnosis not present

## 2018-07-29 DIAGNOSIS — E1151 Type 2 diabetes mellitus with diabetic peripheral angiopathy without gangrene: Secondary | ICD-10-CM | POA: Diagnosis present

## 2018-07-29 DIAGNOSIS — S2242XD Multiple fractures of ribs, left side, subsequent encounter for fracture with routine healing: Secondary | ICD-10-CM | POA: Diagnosis not present

## 2018-07-29 DIAGNOSIS — Z9841 Cataract extraction status, right eye: Secondary | ICD-10-CM | POA: Diagnosis not present

## 2018-07-29 DIAGNOSIS — Z8249 Family history of ischemic heart disease and other diseases of the circulatory system: Secondary | ICD-10-CM | POA: Diagnosis not present

## 2018-07-29 DIAGNOSIS — E785 Hyperlipidemia, unspecified: Secondary | ICD-10-CM | POA: Diagnosis present

## 2018-07-29 DIAGNOSIS — G4733 Obstructive sleep apnea (adult) (pediatric): Secondary | ICD-10-CM | POA: Diagnosis present

## 2018-07-29 DIAGNOSIS — R32 Unspecified urinary incontinence: Secondary | ICD-10-CM | POA: Diagnosis present

## 2018-07-29 DIAGNOSIS — S069X9A Unspecified intracranial injury with loss of consciousness of unspecified duration, initial encounter: Secondary | ICD-10-CM | POA: Diagnosis present

## 2018-07-29 DIAGNOSIS — I120 Hypertensive chronic kidney disease with stage 5 chronic kidney disease or end stage renal disease: Secondary | ICD-10-CM | POA: Diagnosis present

## 2018-07-29 DIAGNOSIS — R4189 Other symptoms and signs involving cognitive functions and awareness: Secondary | ICD-10-CM | POA: Diagnosis present

## 2018-07-29 DIAGNOSIS — Z298 Encounter for other specified prophylactic measures: Secondary | ICD-10-CM

## 2018-07-29 DIAGNOSIS — N2581 Secondary hyperparathyroidism of renal origin: Secondary | ICD-10-CM | POA: Diagnosis present

## 2018-07-29 DIAGNOSIS — Z7982 Long term (current) use of aspirin: Secondary | ICD-10-CM | POA: Diagnosis not present

## 2018-07-29 DIAGNOSIS — R0609 Other forms of dyspnea: Secondary | ICD-10-CM

## 2018-07-29 DIAGNOSIS — R42 Dizziness and giddiness: Secondary | ICD-10-CM | POA: Diagnosis present

## 2018-07-29 DIAGNOSIS — I10 Essential (primary) hypertension: Secondary | ICD-10-CM | POA: Diagnosis not present

## 2018-07-29 DIAGNOSIS — S065X0S Traumatic subdural hemorrhage without loss of consciousness, sequela: Secondary | ICD-10-CM

## 2018-07-29 DIAGNOSIS — R0989 Other specified symptoms and signs involving the circulatory and respiratory systems: Secondary | ICD-10-CM | POA: Diagnosis not present

## 2018-07-29 DIAGNOSIS — R630 Anorexia: Secondary | ICD-10-CM | POA: Diagnosis present

## 2018-07-29 DIAGNOSIS — S065X9A Traumatic subdural hemorrhage with loss of consciousness of unspecified duration, initial encounter: Secondary | ICD-10-CM | POA: Diagnosis present

## 2018-07-29 DIAGNOSIS — E871 Hypo-osmolality and hyponatremia: Secondary | ICD-10-CM | POA: Diagnosis present

## 2018-07-29 DIAGNOSIS — R7309 Other abnormal glucose: Secondary | ICD-10-CM | POA: Diagnosis not present

## 2018-07-29 DIAGNOSIS — S065X9S Traumatic subdural hemorrhage with loss of consciousness of unspecified duration, sequela: Secondary | ICD-10-CM | POA: Diagnosis not present

## 2018-07-29 DIAGNOSIS — S069X0D Unspecified intracranial injury without loss of consciousness, subsequent encounter: Secondary | ICD-10-CM | POA: Diagnosis not present

## 2018-07-29 DIAGNOSIS — D649 Anemia, unspecified: Secondary | ICD-10-CM

## 2018-07-29 DIAGNOSIS — I959 Hypotension, unspecified: Secondary | ICD-10-CM | POA: Diagnosis not present

## 2018-07-29 DIAGNOSIS — S069X0A Unspecified intracranial injury without loss of consciousness, initial encounter: Secondary | ICD-10-CM | POA: Diagnosis not present

## 2018-07-29 DIAGNOSIS — X58XXXD Exposure to other specified factors, subsequent encounter: Secondary | ICD-10-CM | POA: Diagnosis present

## 2018-07-29 DIAGNOSIS — Z79890 Hormone replacement therapy (postmenopausal): Secondary | ICD-10-CM

## 2018-07-29 DIAGNOSIS — R4020 Unspecified coma: Secondary | ICD-10-CM | POA: Diagnosis not present

## 2018-07-29 DIAGNOSIS — Z79899 Other long term (current) drug therapy: Secondary | ICD-10-CM

## 2018-07-29 DIAGNOSIS — S069XAA Unspecified intracranial injury with loss of consciousness status unknown, initial encounter: Secondary | ICD-10-CM | POA: Diagnosis present

## 2018-07-29 DIAGNOSIS — K5900 Constipation, unspecified: Secondary | ICD-10-CM | POA: Diagnosis present

## 2018-07-29 HISTORY — DX: Unspecified dementia, unspecified severity, without behavioral disturbance, psychotic disturbance, mood disturbance, and anxiety: F03.90

## 2018-07-29 LAB — RENAL FUNCTION PANEL
Albumin: 2.7 g/dL — ABNORMAL LOW (ref 3.5–5.0)
Anion gap: 12 (ref 5–15)
BUN: 33 mg/dL — ABNORMAL HIGH (ref 8–23)
CO2: 28 mmol/L (ref 22–32)
Calcium: 10.6 mg/dL — ABNORMAL HIGH (ref 8.9–10.3)
Chloride: 95 mmol/L — ABNORMAL LOW (ref 98–111)
Creatinine, Ser: 5.55 mg/dL — ABNORMAL HIGH (ref 0.61–1.24)
GFR calc Af Amer: 10 mL/min — ABNORMAL LOW (ref >60)
GFR calc non Af Amer: 9 mL/min — ABNORMAL LOW (ref >60)
Glucose, Bld: 119 mg/dL — ABNORMAL HIGH (ref 70–99)
Phosphorus: 4.7 mg/dL — ABNORMAL HIGH (ref 2.5–4.6)
Potassium: 4.4 mmol/L (ref 3.5–5.1)
Sodium: 135 mmol/L (ref 135–145)

## 2018-07-29 LAB — CBC
HCT: 40 % (ref 39.0–52.0)
Hemoglobin: 12.4 g/dL — ABNORMAL LOW (ref 13.0–17.0)
MCH: 30.6 pg (ref 26.0–34.0)
MCHC: 31 g/dL (ref 30.0–36.0)
MCV: 98.8 fL (ref 80.0–100.0)
Platelets: 188 10*3/uL (ref 150–400)
RBC: 4.05 MIL/uL — ABNORMAL LOW (ref 4.22–5.81)
RDW: 15.7 % — ABNORMAL HIGH (ref 11.5–15.5)
WBC: 8.1 10*3/uL (ref 4.0–10.5)
nRBC: 0 % (ref 0.0–0.2)

## 2018-07-29 LAB — GLUCOSE, CAPILLARY
Glucose-Capillary: 98 mg/dL (ref 70–99)
Glucose-Capillary: 121 mg/dL — ABNORMAL HIGH (ref 70–99)
Glucose-Capillary: 172 mg/dL — ABNORMAL HIGH (ref 70–99)

## 2018-07-29 MED ORDER — CHLORHEXIDINE GLUCONATE CLOTH 2 % EX PADS
6.0000 | MEDICATED_PAD | Freq: Every day | CUTANEOUS | Status: DC
  Administered 2018-07-31: 6 via TOPICAL

## 2018-07-29 MED ORDER — LIDOCAINE HCL (PF) 1 % IJ SOLN: 5.0000 mL | INTRAMUSCULAR | Status: DC | PRN

## 2018-07-29 MED ORDER — BISACODYL 10 MG RE SUPP
10.0000 mg | Freq: Every day | RECTAL | Status: DC | PRN
  Administered 2018-08-02 – 2018-08-12 (×2): 10 mg via RECTAL
  Filled 2018-07-29 (×2): qty 1

## 2018-07-29 MED ORDER — SODIUM CHLORIDE 0.9 % IV SOLN: 100.0000 mL | INTRAVENOUS | Status: DC | PRN

## 2018-07-29 MED ORDER — MEMANTINE HCL 10 MG PO TABS
10.0000 mg | ORAL_TABLET | Freq: Two times a day (BID) | ORAL | Status: DC
  Administered 2018-07-29 – 2018-08-13 (×30): 10 mg via ORAL
  Filled 2018-07-29 (×31): qty 1

## 2018-07-29 MED ORDER — FLEET ENEMA 7-19 GM/118ML RE ENEM: 1.0000 | ENEMA | Freq: Once | RECTAL | Status: DC | PRN

## 2018-07-29 MED ORDER — HEPARIN SODIUM (PORCINE) 1000 UNIT/ML DIALYSIS: 1000.0000 [IU] | INTRAMUSCULAR | Status: DC | PRN

## 2018-07-29 MED ORDER — DOCUSATE SODIUM 100 MG PO CAPS
100.0000 mg | ORAL_CAPSULE | Freq: Two times a day (BID) | ORAL | Status: DC
  Administered 2018-07-29 – 2018-08-10 (×24): 100 mg via ORAL
  Filled 2018-07-29 (×24): qty 1

## 2018-07-29 MED ORDER — ALTEPLASE 2 MG IJ SOLR: 2.0000 mg | Freq: Once | INTRAMUSCULAR | Status: DC | PRN

## 2018-07-29 MED ORDER — SIMVASTATIN 20 MG PO TABS
20.0000 mg | ORAL_TABLET | Freq: Every day | ORAL | Status: DC
  Administered 2018-07-29 – 2018-08-12 (×15): 20 mg via ORAL
  Filled 2018-07-29 (×15): qty 1

## 2018-07-29 MED ORDER — LEVOTHYROXINE SODIUM 25 MCG PO TABS
50.0000 ug | ORAL_TABLET | Freq: Every day | ORAL | Status: DC
  Administered 2018-07-30 – 2018-08-13 (×15): 50 ug via ORAL
  Filled 2018-07-29 (×15): qty 2

## 2018-07-29 MED ORDER — LIDOCAINE-PRILOCAINE 2.5-2.5 % EX CREA: 1.0000 "application " | TOPICAL_CREAM | CUTANEOUS | Status: DC | PRN

## 2018-07-29 MED ORDER — PRO-STAT SUGAR FREE PO LIQD
30.0000 mL | Freq: Two times a day (BID) | ORAL | Status: DC
  Administered 2018-07-29 – 2018-08-13 (×30): 30 mL via ORAL
  Filled 2018-07-29 (×30): qty 30

## 2018-07-29 MED ORDER — LEVETIRACETAM 500 MG PO TABS
500.0000 mg | ORAL_TABLET | Freq: Two times a day (BID) | ORAL | Status: AC
  Administered 2018-07-29 – 2018-08-01 (×6): 500 mg via ORAL
  Filled 2018-07-29 (×6): qty 1

## 2018-07-29 MED ORDER — CHLORHEXIDINE GLUCONATE CLOTH 2 % EX PADS: 6.0000 | MEDICATED_PAD | Freq: Every day | CUTANEOUS | Status: DC

## 2018-07-29 MED ORDER — LIRAGLUTIDE 18 MG/3ML ~~LOC~~ SOPN
0.6000 mg | PEN_INJECTOR | Freq: Every day | SUBCUTANEOUS | Status: DC
  Administered 2018-07-31 – 2018-08-12 (×12): 0.6 mg via SUBCUTANEOUS
  Filled 2018-07-29 (×9): qty 3

## 2018-07-29 MED ORDER — CARBOXYMETHYLCELLULOSE SOD PF 0.25 % OP SOLN: 1.0000 [drp] | OPHTHALMIC | Status: DC | PRN

## 2018-07-29 MED ORDER — POLYETHYLENE GLYCOL 3350 17 G PO PACK
17.0000 g | PACK | Freq: Every day | ORAL | Status: DC | PRN
  Administered 2018-08-01 – 2018-08-04 (×3): 17 g via ORAL
  Filled 2018-07-29 (×3): qty 1

## 2018-07-29 MED ORDER — PROCHLORPERAZINE 25 MG RE SUPP: 12.5000 mg | Freq: Four times a day (QID) | RECTAL | Status: DC | PRN

## 2018-07-29 MED ORDER — NEPRO/CARBSTEADY PO LIQD
237.0000 mL | Freq: Two times a day (BID) | ORAL | Status: DC
  Administered 2018-07-30 – 2018-08-13 (×22): 237 mL via ORAL

## 2018-07-29 MED ORDER — ALUMINUM HYDROXIDE GEL 320 MG/5ML PO SUSP
10.0000 mL | Freq: Four times a day (QID) | ORAL | Status: DC | PRN
  Filled 2018-07-29: qty 30

## 2018-07-29 MED ORDER — PENTAFLUOROPROP-TETRAFLUOROETH EX AERO: 1.0000 "application " | INHALATION_SPRAY | CUTANEOUS | Status: DC | PRN

## 2018-07-29 MED ORDER — POLYVINYL ALCOHOL 1.4 % OP SOLN: 1.0000 [drp] | OPHTHALMIC | Status: DC | PRN

## 2018-07-29 MED ORDER — SACCHAROMYCES BOULARDII 250 MG PO CAPS
250.0000 mg | ORAL_CAPSULE | Freq: Two times a day (BID) | ORAL | Status: DC
  Administered 2018-07-29 – 2018-08-13 (×30): 250 mg via ORAL
  Filled 2018-07-29 (×30): qty 1

## 2018-07-29 MED ORDER — VITAMIN B-12 1000 MCG PO TABS
1000.0000 ug | ORAL_TABLET | Freq: Every day | ORAL | Status: DC
  Administered 2018-07-30 – 2018-08-13 (×15): 1000 ug via ORAL
  Filled 2018-07-29 (×15): qty 1

## 2018-07-29 MED ORDER — RENA-VITE PO TABS
1.0000 | ORAL_TABLET | Freq: Every day | ORAL | Status: DC
  Administered 2018-07-29 – 2018-08-12 (×15): 1 via ORAL
  Filled 2018-07-29 (×15): qty 1

## 2018-07-29 MED ORDER — ALTEPLASE 2 MG IJ SOLR
2.0000 mg | Freq: Once | INTRAMUSCULAR | Status: DC | PRN
  Filled 2018-07-29: qty 2

## 2018-07-29 MED ORDER — ACETAMINOPHEN 325 MG PO TABS
325.0000 mg | ORAL_TABLET | ORAL | Status: DC | PRN
  Administered 2018-07-30 – 2018-08-13 (×21): 650 mg via ORAL
  Filled 2018-07-29 (×20): qty 2

## 2018-07-29 MED ORDER — INSULIN ASPART 100 UNIT/ML ~~LOC~~ SOLN
0.0000 [IU] | Freq: Three times a day (TID) | SUBCUTANEOUS | Status: DC
  Administered 2018-07-30: 5 [IU] via SUBCUTANEOUS
  Administered 2018-07-30 – 2018-07-31 (×2): 1 [IU] via SUBCUTANEOUS
  Administered 2018-07-31 – 2018-08-01 (×2): 2 [IU] via SUBCUTANEOUS
  Administered 2018-08-01 – 2018-08-02 (×2): 3 [IU] via SUBCUTANEOUS
  Administered 2018-08-02: 1 [IU] via SUBCUTANEOUS
  Administered 2018-08-03: 3 [IU] via SUBCUTANEOUS
  Administered 2018-08-03: 2 [IU] via SUBCUTANEOUS
  Administered 2018-08-04 – 2018-08-06 (×5): 1 [IU] via SUBCUTANEOUS
  Administered 2018-08-07: 2 [IU] via SUBCUTANEOUS
  Administered 2018-08-08: 1 [IU] via SUBCUTANEOUS
  Administered 2018-08-08 (×2): 2 [IU] via SUBCUTANEOUS
  Administered 2018-08-09: 3 [IU] via SUBCUTANEOUS
  Administered 2018-08-10 – 2018-08-12 (×5): 2 [IU] via SUBCUTANEOUS
  Administered 2018-08-13: 5 [IU] via SUBCUTANEOUS

## 2018-07-29 MED ORDER — LEVETIRACETAM 500 MG PO TABS: 500.0000 mg | ORAL_TABLET | Freq: Two times a day (BID) | ORAL | Status: DC

## 2018-07-29 MED ORDER — TRAZODONE HCL 50 MG PO TABS
25.0000 mg | ORAL_TABLET | Freq: Every evening | ORAL | Status: DC | PRN
  Administered 2018-07-31: 50 mg via ORAL
  Filled 2018-07-29: qty 1

## 2018-07-29 MED ORDER — CINACALCET HCL 30 MG PO TABS
30.0000 mg | ORAL_TABLET | Freq: Every day | ORAL | Status: DC
  Administered 2018-07-29: 30 mg via ORAL
  Filled 2018-07-29: qty 1

## 2018-07-29 MED ORDER — PANTOPRAZOLE SODIUM 40 MG PO TBEC
40.0000 mg | DELAYED_RELEASE_TABLET | Freq: Every day | ORAL | Status: DC
  Administered 2018-07-30 – 2018-08-13 (×15): 40 mg via ORAL
  Filled 2018-07-29 (×15): qty 1

## 2018-07-29 MED ORDER — CINACALCET HCL 30 MG PO TABS
30.0000 mg | ORAL_TABLET | Freq: Every day | ORAL | Status: DC
  Administered 2018-07-30 – 2018-08-12 (×13): 30 mg via ORAL
  Filled 2018-07-29 (×14): qty 1

## 2018-07-29 MED ORDER — PROCHLORPERAZINE MALEATE 5 MG PO TABS
5.0000 mg | ORAL_TABLET | Freq: Four times a day (QID) | ORAL | Status: DC | PRN
  Administered 2018-08-08: 5 mg via ORAL
  Filled 2018-07-29: qty 1

## 2018-07-29 MED ORDER — LEVETIRACETAM 500 MG PO TABS
500.0000 mg | ORAL_TABLET | Freq: Two times a day (BID) | ORAL | Status: DC
  Administered 2018-07-29: 500 mg via ORAL
  Filled 2018-07-29: qty 1

## 2018-07-29 MED ORDER — QUETIAPINE FUMARATE 25 MG PO TABS
25.0000 mg | ORAL_TABLET | Freq: Two times a day (BID) | ORAL | Status: DC | PRN
  Administered 2018-07-30: 25 mg via ORAL
  Filled 2018-07-29 (×2): qty 1

## 2018-07-29 MED ORDER — PROCHLORPERAZINE EDISYLATE 10 MG/2ML IJ SOLN
5.0000 mg | Freq: Four times a day (QID) | INTRAMUSCULAR | Status: DC | PRN
  Administered 2018-07-31: 10 mg via INTRAMUSCULAR
  Filled 2018-07-29: qty 2

## 2018-07-29 MED ORDER — DIPHENHYDRAMINE HCL 12.5 MG/5ML PO ELIX
12.5000 mg | ORAL_SOLUTION | Freq: Four times a day (QID) | ORAL | Status: DC | PRN
  Administered 2018-08-01: 12.5 mg via ORAL
  Administered 2018-08-02 – 2018-08-09 (×2): 25 mg via ORAL
  Filled 2018-07-29 (×4): qty 10

## 2018-07-29 MED ORDER — SEVELAMER CARBONATE 800 MG PO TABS
800.0000 mg | ORAL_TABLET | Freq: Every day | ORAL | Status: DC
  Administered 2018-07-30 – 2018-08-12 (×13): 800 mg via ORAL
  Filled 2018-07-29 (×14): qty 1

## 2018-07-29 MED ORDER — DONEPEZIL HCL 10 MG PO TABS
10.0000 mg | ORAL_TABLET | Freq: Every day | ORAL | Status: DC
  Administered 2018-07-29 – 2018-08-12 (×15): 10 mg via ORAL
  Filled 2018-07-29 (×16): qty 1

## 2018-07-29 MED ORDER — GUAIFENESIN-DM 100-10 MG/5ML PO SYRP
5.0000 mL | ORAL_SOLUTION | Freq: Four times a day (QID) | ORAL | Status: DC | PRN
  Administered 2018-08-03 – 2018-08-10 (×4): 10 mL via ORAL
  Filled 2018-07-29 (×4): qty 10

## 2018-07-29 NOTE — Progress Notes (Signed)
PMR Admission Coordinator Pre-Admission Assessment  Patient: [name] is an 77 y.o., male MRN: [id number] DOB: 1941-05-24 Height: 5' 11"  (180.3 cm) Weight: 63.9 kg                                                                                                                                                  Insurance Information HMO:     PPO:      PCP:      IPA:      80/20:      OTHER:  PRIMARY: Medicare A and B      Policy#: [id number]      Subscriber: Patient CM Name:    Phone#:     Fax#:  Pre-Cert#:       Employer:  Benefits:  Phone #: NA      Name: verified online via [place] on 07/28/18 Eff. Date: Part A effective: 07/25/06 Part B effective 07/25/06     Deduct: $1,364      Out of Pocket Max:       Life Max:  CIR: Covered per Medicare Guidelines once yearly deductible is met      SNF: days 1-20, 100%, days 21-100, 80% Outpatient: 80%     Co-Pay: 20% Home Health: 100%      Co-Pay:  DME: 80%     Co-Pay: 20% Providers:  SECONDARY: [facility]      Policy#: [id number]      Subscriber: Patient CM Name:       Phone#: [phone]     Fax#:  Pre-Cert#:       Employer:  Benefits:  Phone #:      Name:  Eff. Date:      Deduct:       Out of Pocket Max:       Life Max:  CIR:       SNF:  Outpatient:      Co-Pay:  Home Health:       Co-Pay:  DME:      Co-Pay:   Medicaid Application Date:       Case Manager: Disability Application Date:       Case Worker:   Emergency Contact Information         Contact Information    Name Relation Home Work Mobile   Buday,Shirley Spouse [phone]  [phone]   Dickmann,Crystal Daughter [phone]  [phone]   Summers,Tammy Niece [phone]  [phone]     Current Medical History  Patient Admitting Diagnosis: traumatic SDH's History of Present Illness: [name] a 77 y.o.malewith history of ESRD-hemodialysis MWF, T2DM, recent GIB due to AVM, chronic hypotension, dementia, OSA;who was admitted to [facility] on  07/20/2018 with worsening of confusion headaches and multiple falls. 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  He was evaluated by Dr. Ellene Route and underwent bilateral bur hole evacuation of SDH on 10/29.Swallow function within normal limits and patient on regular textures. Therapy ongoing and patient is showing improvement in balance as well as improvement in ability to follow commands. CIR recommended due to functional deficits. Pt underwent repeat CT scan on 11/4, with reduction in size of both subdurals; Neurosurgery has recommended intermittant CT scans. Staples to be removed tomorrow. Pt will be admitted to CIR on 07/29/18.   Past Medical History      Past Medical History:  Diagnosis Date  . Allergy   . Anemia   . Arthritis   . Cataract    bil cateracts removed  . Coronary artery disease   . Diabetes mellitus    Type 2  . Diverticulitis   . ED (erectile dysfunction)   . 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 ESRD (end stage renal disease) on dialysis ([place]) 03/2015  . GERD (gastroesophageal reflux disease)    pepto   . Gout   . Hyperlipidemia   . Hypertension   . Hypothyroidism   . Pneumonia   . PVD (peripheral vascular disease) ([place])    has plastic aorta  . Renal insufficiency   . Seasonal allergies   . Shortness of breath dyspnea   . Sleep apnea    does not wear c-pap  . Thyroid disease     Family History  family history includes Aneurysm in his mother; Diabetes in his father; Heart disease in his father; Hypertension in his father; Stroke in his father.  Prior Rehab/Hospitalizations:  Has the patient had major surgery during 100 days prior to admission? Yes  Current Medications   Current Facility-Administered Medications:  .  0.9 %  sodium chloride infusion, 250 mL, Intravenous, PRN, Kristeen Miss, MD .  0.9 %  sodium chloride infusion, , Intravenous, Continuous,  Kristeen Miss, MD, Stopped at 07/25/18 0830 .  acetaminophen (TYLENOL) tablet 650 mg, 650 mg, Oral, Q6H PRN, 650 mg at 07/28/18 2148 **OR** acetaminophen (TYLENOL) suppository 650 mg, 650 mg, Rectal, Q6H PRN, Kristeen Miss, MD .  bisacodyl (DULCOLAX) suppository 10 mg, 10 mg, Rectal, Daily PRN, Kristeen Miss, MD .  Chlorhexidine Gluconate Cloth 2 % PADS 6 each, 6 each, Topical, Q0600, Edrick Oh, MD, 6 each at 07/29/18 0546 .  [START ON 07/30/2018] Chlorhexidine Gluconate Cloth 2 % PADS 6 each, 6 each, Topical, Q0600, Justin Mend, MD .  cinacalcet (SENSIPAR) tablet 30 mg, 30 mg, Oral, Q supper, Justin Mend, MD .  dextrose 10 % infusion, , Intravenous, Continuous, Kristeen Miss, MD, Stopped at 07/27/18 0800 .  diphenhydrAMINE (BENADRYL) capsule 25 mg, 25 mg, Oral, Q6H PRN, Ashok Pall, MD, 25 mg at 07/28/18 2148 .  docusate sodium (COLACE) capsule 100 mg, 100 mg, Oral, BID, Kristeen Miss, MD, 100 mg at 07/29/18 0936 .  donepezil (ARICEPT) tablet 10 mg, 10 mg, Oral, QHS, Kristeen Miss, MD, 10 mg at 07/28/18 2148 .  feeding supplement (NEPRO CARB STEADY) liquid 237 mL, 237 mL, Oral, BID BM, Bergman, Martha, PA-C, 237 mL at 07/28/18 1410 .  feeding supplement (PRO-STAT SUGAR FREE 64) liquid 30 mL, 30 mL, Oral, BID, Alric Seton, PA-C, 30 mL at 07/28/18 2148 .  fentaNYL (SUBLIMAZE) injection 12.5-25 mcg, 12.5-25 mcg, Intravenous, Q2H PRN, Kristeen Miss, MD, 25 mcg at 07/28/18 2147 .  insulin aspart (novoLOG) injection 0-9 Units, 0-9 Units, Subcutaneous, TID WC, Kristeen Miss, MD, 3 Units at 07/28/18 1722 .  labetalol (NORMODYNE,TRANDATE) injection  10-40 mg, 10-40 mg, Intravenous, Q10 min PRN, Kristeen Miss, MD, 10 mg at 07/27/18 2105 .  levETIRAcetam (KEPPRA) tablet 500 mg, 500 mg, Oral, BID, Bonnielee Haff, MD .  levothyroxine (SYNTHROID, LEVOTHROID) tablet 50 mcg, 50 mcg, Oral, QAC breakfast, Kristeen Miss, MD, 50 mcg at 07/29/18 0937 .  memantine (NAMENDA) tablet 10 mg, 10 mg, Oral,  BID, Kristeen Miss, MD, 10 mg at 07/29/18 0321 .  multivitamin (RENA-VIT) tablet 1 tablet, 1 tablet, Oral, QHS, Alric Seton, PA-C, 1 tablet at 07/28/18 2148 .  ondansetron (ZOFRAN) tablet 4 mg, 4 mg, Oral, Q4H PRN **OR** ondansetron (ZOFRAN) injection 4 mg, 4 mg, Intravenous, Q4H PRN, Kristeen Miss, MD .  pantoprazole (PROTONIX) EC tablet 40 mg, 40 mg, Oral, Daily, Kristeen Miss, MD, 40 mg at 07/29/18 0936 .  polyethylene glycol (MIRALAX / GLYCOLAX) packet 17 g, 17 g, Oral, Daily, Kristeen Miss, MD, 17 g at 07/27/18 1228 .  polyethylene glycol (MIRALAX / GLYCOLAX) packet 17 g, 17 g, Oral, Daily PRN, Kristeen Miss, MD .  polyvinyl alcohol (LIQUIFILM TEARS) 1.4 % ophthalmic solution 1 drop, 1 drop, Both Eyes, PRN, Elsner, Henry, MD .  promethazine (PHENERGAN) tablet 12.5-25 mg, 12.5-25 mg, Oral, Q4H PRN, Kristeen Miss, MD, 25 mg at 07/28/18 2148 .  senna (SENOKOT) tablet 8.6 mg, 1 tablet, Oral, BID, Kristeen Miss, MD, 8.6 mg at 07/29/18 0936 .  sevelamer carbonate (RENVELA) tablet 800 mg, 800 mg, Oral, Q supper, Kristeen Miss, MD, 800 mg at 07/28/18 1722 .  simvastatin (ZOCOR) tablet 20 mg, 20 mg, Oral, QHS, Kristeen Miss, MD, 20 mg at 07/28/18 2148 .  sodium chloride flush (NS) 0.9 % injection 3 mL, 3 mL, Intravenous, Q12H, Kristeen Miss, MD, 3 mL at 07/29/18 0940 .  sodium chloride flush (NS) 0.9 % injection 3 mL, 3 mL, Intravenous, Q12H, Kristeen Miss, MD, 3 mL at 07/29/18 0937 .  sodium chloride flush (NS) 0.9 % injection 3 mL, 3 mL, Intravenous, PRN, Kristeen Miss, MD .  sodium phosphate (FLEET) 7-19 GM/118ML enema 1 enema, 1 enema, Rectal, Once PRN, Kristeen Miss, MD .  vitamin B-12 (CYANOCOBALAMIN) tablet 1,000 mcg, 1,000 mcg, Oral, Daily, Kristeen Miss, MD, 1,000 mcg at 07/29/18 2248  Patients Current Diet:     Diet Order                  Diet renal/carb modified with fluid restriction Diet-HS Snack? Nothing; Fluid restriction: 1200 mL Fluid; Room service appropriate? Yes; Fluid  consistency: Thin  Diet effective now               Precautions / Restrictions Precautions Precautions: Fall Restrictions Weight Bearing Restrictions: No   Has the patient had 2 or more falls or a fall with injury in the past year?Yes  Prior Activity Level Limited Community (1-2x/wk): would go out for dialysis using wc assessible Lucianne Lei; homebound for majority of the time  [employer] / Equipment  Prior Device Use: Indicate devices/aids used by the patient prior to current illness, exacerbation or injury? Manual wheelchair and Walker  Prior Functional Level Prior Function Comments: unclear  Self Care: Did the patient need help bathing, dressing, using the toilet or eating?  Needed some help  Indoor Mobility: Did the patient need assistance with walking from room to room (with or without device)? Independent  Stairs: Did the patient need assistance with internal or external stairs (with or without device)? Needed some help  Functional Cognition: Did the patient need help planning regular tasks such  as shopping or remembering to take medications? Needed some help  Current Functional Level Cognition  Overall Cognitive Status: No family/caregiver present to determine baseline cognitive functioning Current Attention Level: Sustained Orientation Level: Oriented to person, Oriented to place, Disoriented to time, Disoriented to situation Following Commands: Follows one step commands consistently Safety/Judgement: Decreased awareness of deficits General Comments: pt oriented to place, day, situation and responding appropriately throughout session    Extremity Assessment (includes Sensation/Coordination)  Upper Extremity Assessment: Defer to OT evaluation  Lower Extremity Assessment: Generalized weakness, Difficult to assess due to impaired cognition(moves both LEs through full range)    ADLs       Mobility  Overal bed mobility: Needs  Assistance Bed Mobility: Supine to Sit Supine to sit: Min assist Sit to supine: Min assist General bed mobility comments: min HHA to elevate trunk from surface, increased time    Transfers  Overall transfer level: Needs assistance Equipment used: Rolling walker (2 wheeled) Transfers: Sit to/from Stand Sit to Stand: Min guard General transfer comment: minguard to rise from bed and chair without armrests    Ambulation / Gait / Stairs / Wheelchair Mobility  Ambulation/Gait Ambulation/Gait assistance: Mod assist Gait Distance (Feet): 90 Feet Assistive device: Rolling walker (2 wheeled), 1 person hand held assist Gait Pattern/deviations: Trunk flexed, Shuffle General Gait Details: pt with very short shuffling steps today, tendency for posterior bias with flexed trunk and too posterior to walker. Pt walked 10' then reported dizziness and requested to sit. BP stable. 2nd trial walked 66' with max cues for posture, position in RW with assist to direct and control RW as well as maintain anterior translation. Pt complaining of Rt wrist pain and attempted 10' without RW with HHA but pt continues to require mod assist and reach out for environment with free hand Gait velocity: decreased Gait velocity interpretation: <1.31 ft/sec, indicative of household ambulator    Posture / Balance Balance Overall balance assessment: Needs assistance, History of Falls Sitting-balance support: Feet supported Sitting balance-Leahy Scale: Fair Postural control: Posterior lean Standing balance support: Bilateral upper extremity supported, During functional activity Standing balance-Leahy Scale: Poor    Special needs/care consideration BiPAP/CPAP: no CPM: no Continuous Drip IV: dextrose 10% infusion, 0.9% sodium chloride infusion Dialysis: yes MWF        Life Vest Oxygen: no Special Bed: no Trach Size: no Wound Vac (area): no      Location: no Skin: right knee abrasion, right and left heels with  cracking skin, ecchymosis to shoulders and hands; bilateral head incisions.                      Bowel mgmt:last BM: 07/28/18; continent Bladder mgmt: urinal, continent; per wife, infrequently makes urine  Diabetic mgmt: yes     Previous Home Environment Living Arrangements: Spouse/significant other Type of Home: House Home Layout: Two level Bathroom Shower/Tub: Tub/shower unit Additional Comments: history taken from chart review, patient only alert/ oriented to self so unreliable historian at this time  Discharge Living Setting Plans for Discharge Living Setting: Patient's home, Lives with (comment)(lives with wife) Type of Home at Discharge: House Discharge Home Layout: Two level Alternate Level Stairs-Rails: None(NA lives on first floor) Alternate Level Stairs-Number of Steps: (NA) Discharge Home Access: Stairs to enter Entrance Stairs-Rails: Can reach both Entrance Stairs-Number of Steps: 4 Discharge Bathroom Shower/Tub: Walk-in shower Discharge Bathroom Toilet: Standard Discharge Bathroom Accessibility: Yes How Accessible: Accessible via wheelchair, Accessible via walker(somewhat accessible; wc can get halfway to  commode) Does the patient have any problems obtaining your medications?: No  Social/Family/Support Systems Patient Roles: Spouse Contact Information: wife is emergency contact: [phone] Anticipated Caregiver: wife and daugther can assist PRN (but lives in different city and can commute if assist planned) Anticipated Ambulance person Information: see above Ability/Limitations of Caregiver: supervision/Min A Caregiver Availability: 24/7 Discharge Plan Discussed with Primary Caregiver: Yes(pt and wife) Is Caregiver In Agreement with Plan?: Yes Does Caregiver/Family have Issues with Lodging/Transportation while Pt is in Rehab?: No   Goals/Additional Needs Patient/Family Goal for Rehab: PT/OT: supervision; SLP: Sup/Min A Expected length of stay: 13-18  days Cultural Considerations: NA Dietary Needs: renal/carb modified with fluid restriction (1200 mL fluid); thin liquids Equipment Needs: TBD Pt/Family Agrees to Admission and willing to participate: Yes Program Orientation Provided & Reviewed with Pt/Caregiver Including Roles  & Responsibilities: Yes(pt and his wife)  Barriers to Discharge: Home environment access/layout  Barriers to Discharge Comments: 4 steps to enter; bathroom somewhat accessible for wc.    Decrease burden of Care through IP rehab admission: NA   Possible need for SNF placement upon discharge: not anticipated; pt has good social support at DC. Pt has good prognosis for further progress.    Patient Condition: This patient's medical and functional status has changed since the consult dated: 07/25/18 in which the Rehabilitation Physician determined and documented that the patient's condition is appropriate for intensive rehabilitative care in an inpatient rehabilitation facility. See "History of Present Illness" (above) for medical update. Functional changes are: improvement in functional transfer status from [place] A/Min G to Min G; However, ambulation distance has slightly decreased from Mod A 120 feet to Mod A 90 feet. Patient's medical and functional status update has been discussed with the Rehabilitation physician and patient remains appropriate for inpatient rehabilitation. Will admit to inpatient rehab today.  Preadmission Screen Completed By:  Jhonnie Garner, 07/29/2018 4:05 PM ______________________________________________________________________   Discussed status with Dr. Naaman Plummer on 07/29/18 at 4:04PM and received telephone approval for admission today.  Admission Coordinator:  Jhonnie Garner, time 4:04PM/Date 07/29/18           Cosigned by: Meredith Staggers, MD at 07/29/2018 4:29 PM  Revision History

## 2018-07-29 NOTE — Progress Notes (Signed)
Inpatient Rehabilitation-Admissions Coordinator   [facility] has received medical approval for admit to CIR today. AC has updated pt, RN, CM and SW regarding plans. Pt and family in agreement for CIR.   Please call if questions.   Jhonnie Garner, OTR/L  Rehab Admissions Coordinator  [phone] 07/29/2018 4:15 PM

## 2018-07-29 NOTE — Care Management Note (Signed)
Case Management Note  Patient Details  Name: [name] MRN: [id number] Date of Birth: 01/22/1941  Subjective/Objective: Pt presented with increased confusion and history of falls at home on 07/20/18.   Imaging revealed Bil SDH with modest mass-effect causing concave deformity, multi rib fx on left.. S/p burr holes 10/29.  PTA, pt resides at home with spouse.                  Action/Plan: PT/OT Recommending CIR, and pt stable for dc to CIR today.  Plan dc to [facility] IP rehab later today.  Expected Discharge Date:  07/29/18               Expected Discharge Plan:     In-House Referral:     Discharge planning Services  CM Consult  Post Acute Care Choice:    Choice offered to:     DME Arranged:    DME Agency:     HH Arranged:    HH Agency:     Status of Service:  Completed, signed off  If discussed at [employer] of Stay Meetings, dates discussed:    Additional Comments:  Ella Bodo, RN 07/29/2018, 3:49 PM

## 2018-07-29 NOTE — Progress Notes (Signed)
[facility] Progress Note   Dialysis Orders: Dialysis Orders:MWF [place] right AVF 4 hrs 180 NRe 400/Autoflow 1.5 71 kg 2.0K/2.0 Ca  R AVF -Heparin 1800 units IV -Mircera 150 mcg IV q 2 weeks (last dose 07/16/18 Last HGB 11.4 07/16/18)  Assessment/Plan: 1. 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  2. ESRD - MWF, HD tomorrow.  Well below EDW (71kg vs 63.9) , will try 2L UF tomorrow which he's been tolerating well.   3. Anemia - hgb 11.4 stable  4. Secondary hyperparathyroidism - Corr ca ~11, switch to 2 Ca dialysate for next treatment. PTH was 217 10/23, corr ca 11.4 at that time. Calcitriol was d/cd at that time as well.  Start sensipar 30 daily.  5. HTN/volume - Normotensive on non HD day. Currently no midodrine at present- trend for now. 6. Nutrition - tolerating solids alb 2.4 - added supplements and multivit 7. Disp - for transfer to CIR 8. Dementia - baseline but  functional prior to acute event. 9. Multiple rib fx - stable 10. DM/hypothyroidism - meds per primary  Jannifer Hick MD [facility] Pager [phone]  Subjective:   Per notes more confused last PM - calling 911 from cell phone.  Today asks me when something is going to be done for his head bleed.  Has no recollection of hospital events.  Tol HD yesterday.  Objective Vitals:   07/28/18 1944 07/28/18 2322 07/29/18 0546 07/29/18 0821  BP: (!) 107/54 (!) 107/54 108/71 (!) 136/105  Pulse: 82  79 85  Resp:  18 (!) 21   Temp: 97.8 F (36.6 C) 98.5 F (36.9 C) 97.9 F (36.6 C) 98.7 F (37.1 C)  TempSrc: Oral Oral Oral   SpO2: 100%  100%   Weight:      Height:       Physical Exam General: comfortable at 45 deg Heart: no rub Lungs: normal WOB, clear ant Abdomen: soft, nontender Extremities: no edema Dialysis Access: RUE AV access +thrill   Additional Objective Labs: Basic  Metabolic Panel: Recent Labs  Lab 07/23/18 0839 07/25/18 0623 07/27/18 0340 07/28/18 0830  NA 136 134* 132* 130*  K 4.0 3.4* 4.9 4.1  CL 99 99 95* 93*  CO2 25 27 25 25   GLUCOSE 122* 90 93 135*  BUN 19 21 26* 38*  CREATININE 6.01* 5.48* 5.44* 7.09*  CALCIUM 9.8 10.3 10.0 10.1  PHOS 7.1*  --   --  5.0*   Liver Function Tests: Recent Labs  Lab 07/23/18 0839 07/28/18 0830  ALBUMIN 2.7* 2.4*   No results for input(s): LIPASE, AMYLASE in the last 168 hours. CBC: Recent Labs  Lab 07/23/18 0839 07/25/18 0623 07/27/18 0340 07/28/18 0830  WBC 8.2 7.7 7.5 9.1  HGB 10.8* 11.0* 11.6* 11.4*  HCT 32.8* 35.3* 36.5* 34.4*  MCV 98.5 99.7 98.1 96.1  PLT 180 167 158 190   Blood Culture    Component Value Date/Time   SDES URINE, CATHETERIZED 01/23/2018 2043   [place] NONE 01/23/2018 2043   CULT  01/23/2018 2043    NO GROWTH Performed at [facility] [name] [place], [place] [address]    REPTSTATUS 01/25/2018 FINAL 01/23/2018 2043    Cardiac Enzymes: No results for input(s): CKTOTAL, CKMB, CKMBINDEX, TROPONINI in the last 168 hours. CBG: Recent Labs  Lab 07/28/18 0757 07/28/18 1352 07/28/18 1615 07/28/18 2137 07/29/18 0938  GLUCAP 96 127* 214* 93 98   Iron Studies: No results for input(s): IRON, TIBC, TRANSFERRIN, FERRITIN in the last 72 hours. Lab Results  Component Value Date   INR 0.97 07/22/2018   INR 1.01 06/05/2016   Studies/Results: Ct Head Wo Contrast  Result Date: 07/28/2018 CLINICAL DATA:  77 year old male with intracranial hemorrhage. Follow-up. Subsequent encounter. EXAM: CT HEAD WITHOUT CONTRAST TECHNIQUE: Contiguous axial images were obtained from the base of the skull through the vertex without intravenous contrast. COMPARISON:  07/24/2018. FINDINGS: Brain: Right-sided subdural drain has been removed. Slight decrease in amount of pneumocephalus. Broad-based complex left-sided loculated appearing subdural collection with maximal  thickness left frontal region measuring up to 19 mm versus prior 22.8 mm. Left-sided subdural collection causes local mass effect which is most prominent left frontal region which is compressed posteriorly. Mass-effect upon the left frontal horn left lateral ventricle with midline shift to the right by 4.7 mm similar to prior exam. Complex right-sided subdural hematoma with maximal thickness of 8.9 mm versus prior 13.8 mm. Local mass effect slightly improved. No new intracranial hemorrhage noted. No evidence of acute thrombotic infarct. No primary intracranial mass lesion noted on this unenhanced exam. Vascular: No hyperdense vessel. Skull: Burr-holes frontal region. Sinuses/Orbits: No acute orbital abnormality. Visualized paranasal sinuses are clear. Other: Mastoid air cells and middle ear cavities are clear. IMPRESSION: 1. Right subdural drain has been removed. Slight decrease in degree of pneumocephalus. 2. Broad-based complex left-sided loculated appearing subdural collection with maximal thickness left frontal region measuring up to 19 mm versus prior 22.8 mm. Left-sided subdural collection causes local mass effect which is most prominent left frontal region which is compressed posteriorly. Mass-effect upon the left frontal horn of the left lateral ventricle with midline shift to the right by 4.7 mm similar to prior exam. 3. Complex right-sided subdural hematoma with maximal thickness of 8.9 mm versus prior 13.8 mm. Local mass effect slightly improved. Electronically Signed   By: Genia Del M.D.   On: 07/28/2018 18:28   Medications: . sodium chloride    . sodium chloride Stopped (07/25/18 0830)  . dextrose Stopped (07/27/18 0800)  . levETIRAcetam 500 mg (07/29/18 6333)   . Chlorhexidine Gluconate Cloth  6 each Topical Q0600  . docusate sodium  100 mg Oral BID  . donepezil  10 mg Oral QHS  . feeding supplement (NEPRO CARB STEADY)  237 mL Oral BID BM  . feeding supplement (PRO-STAT SUGAR FREE 64)   30 mL Oral BID  . insulin aspart  0-9 Units Subcutaneous TID WC  . levothyroxine  50 mcg Oral QAC breakfast  . memantine  10 mg Oral BID  . multivitamin  1 tablet Oral QHS  . pantoprazole  40 mg Oral Daily  . polyethylene glycol  17 g Oral Daily  . senna  1 tablet Oral BID  . sevelamer carbonate  800 mg Oral Q supper  . simvastatin  20 mg Oral QHS  . sodium chloride flush  3 mL Intravenous Q12H  . sodium chloride flush  3 mL Intravenous Q12H  . vitamin B-12  1,000 mcg Oral Daily

## 2018-07-29 NOTE — H&P (Signed)
Physical Medicine and Rehabilitation Admission H&P    Chief Complaint  Patient presents with  . TBI    HPI: [name] is a 77 year old male with history of ESRD-HD MWF, T2DM, recent GI bleed due to AVM, chronic hypertension, dementia, OSA; who was admitted to [facility] on 07/20/2018 with worsening of confusion, headaches and multiple falls.  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  He was evaluated by Dr. Ellene Route and underwent bilateral bur hole evacuation of subdural hemorrhage on 1029.  Swallow function within normal limits and patient on regular textures.  He has had worsening of cognitive status with confusion for the past couple of days with sleep-wake disruption. CT of head repeated yesterday revealing improvement in right subdural hemorrhage and left residual SDH now with mass-effect left frontal region.  Dr. Ellene Route recommends monitoring patient clinically with intermittent CT scan.  Blood pressures trending high and midodrine on hold.  Patient continues to have bouts of lethargy with slow shuffling gait and balance deficits.  CIR recommended for follow-up therapy   ROS    Past Medical History:  Diagnosis Date  . Allergy   . Anemia   . Arthritis   . Cataract    bil cateracts removed  . Coronary artery disease   . Diabetes mellitus    Type 2  . Diverticulitis   . ED (erectile dysfunction)   . Elevated homocysteine ([place])   . ESRD (end stage renal disease) on dialysis ([place]) 03/2015  . GERD (gastroesophageal reflux disease)    pepto   . Gout   . Hyperlipidemia   . Hypertension   . Hypothyroidism   . Pneumonia   . PVD (peripheral vascular disease) ([place])    has plastic aorta  . Renal insufficiency   . Seasonal allergies   . Shortness of breath dyspnea   . Sleep apnea    does not wear c-pap  . Thyroid disease    Past Surgical History:  Procedure Laterality Date  .  aortobifemoral bypass    . AV FISTULA PLACEMENT Left 12/01/2013   Procedure: ARTERIOVENOUS (AV) FISTULA CREATION- LEFT BRACHIOCEPHALIC;  Surgeon: Angelia Mould, MD;  Location: [place];  Service: Vascular;  Laterality: Left;  . [place] TRANSPOSITION Right 07/27/2014   Procedure: BASCILIC VEIN TRANSPOSITION;  Surgeon: Angelia Mould, MD;  Location: [place];  Service: Vascular;  Laterality: Right;  . BREAST SURGERY     left - granulomatous mastitis  . BURR HOLE Bilateral 07/22/2018   Procedure: BILATERAL BURR HOLES;  Surgeon: Kristeen Miss, MD;  Location: [place];  Service: Neurosurgery;  Laterality: Bilateral;  . COLONOSCOPY    . 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 ENTEROSCOPY N/A 06/03/2018   Procedure: ENTEROSCOPY;  Surgeon: Lavena Bullion, DO;  Location: [place];  Service: Gastroenterology;  Laterality: N/A;  . EYE SURGERY Bilateral    cataracts  . HEMODIALYSIS INPATIENT  01/17/2018      . HOT HEMOSTASIS N/A 06/03/2018   Procedure: HOT HEMOSTASIS (ARGON PLASMA COAGULATION/BICAP);  Surgeon: Lavena Bullion, DO;  Location: [facility] ENDOSCOPY;  Service: Gastroenterology;  Laterality: N/A;  . REVISON OF ARTERIOVENOUS FISTULA Left 02/09/2014   Procedure: REVISON OF LEFT ARTERIOVENOUS FISTULA - RESECTION OF RENDUNDANT VEIN;  Surgeon: Angelia Mould, MD;  Location: [place];  Service: Vascular;  Laterality: Left;  . SBO with lysis adhesions    .  SHUNTOGRAM Left 04/19/2014   Procedure: FISTULOGRAM;  Surgeon: Angelia Mould, MD;  Location: [facility] CATH LAB;  Service: Cardiovascular;  Laterality: Left;  . UNILATERAL UPPER EXTREMEITY ANGIOGRAM N/A 07/12/2014   Procedure: UNILATERAL UPPER Anselmo Rod;  Surgeon: Angelia Mould, MD;  Location: [facility] C[facility] LAB;  Service: Cardiovascular;  Laterality: N/A;    Family History  Problem Relation Age of Onset  . Aneurysm Mother   . Heart disease Father   . Stroke Father   . Hypertension Father   . Diabetes  Father   . Dementia Neg Hx   . Colon cancer Neg Hx   . Esophageal cancer Neg Hx   . Pancreatic cancer Neg Hx   . Prostate cancer Neg Hx   . Rectal cancer Neg Hx   . Stomach cancer Neg Hx     Social History:  Married. Retired from the [employer]. He reports that he quit smoking about 23 years ago. He has never used smokeless tobacco. He reports that he does not drink alcohol or use drugs.     Allergies  Allergen Reactions  . Penicillins Rash    Has patient had a PCN reaction causing immediate rash, facial/tongue/throat swelling, SOB or lightheadedness with hypotension: Yes Has patient had a PCN reaction causing severe rash involving mucus membranes or skin necrosis: Yes Has patient had a PCN reaction that required hospitalization: No Has patient had a PCN reaction occurring within the last 10 years: No If all of the above answers are "NO", then may proceed with Cephalosporin use.    Medications Prior to Admission  Medication Sig Dispense Refill  . acetaminophen (TYLENOL) 500 MG tablet Take 1,000 mg by mouth 2 (two) times daily as needed (for pain).     Marland Kitchen allopurinol (ZYLOPRIM) 100 MG tablet Take 100 mg by mouth daily as needed (pain).     Marland Kitchen aspirin 325 MG tablet Take 325 mg by mouth daily.     . Carboxymethylcellulose Sod PF 0.25 % SOLN Place 1 drop into both eyes as needed (dry eyes).    . clobetasol (TEMOVATE) 0.05 % external solution Apply 1 application topically 2 (two) times daily as needed (irritation). Apply to scalp  0  . denosumab (PROLIA) 60 MG/ML SOSY injection Inject 60 mg into the skin every 6 (six) months.    . donepezil (ARICEPT) 10 MG tablet Take 1 tablet (10 mg total) by mouth at bedtime. 30 tablet 11  . FLUOCINOLONE ACETONIDE SCALP 0.01 % OIL Apply 1 application topically at bedtime. Apply to scalp  0  . fluticasone (CUTIVATE) 0.05 % cream Apply 1 application topically 2 (two) times daily as needed (irritation). Apply to face  0  . fluticasone (FLONASE) 50 MCG/ACT  nasal spray Place 1 spray into both nostrils daily. (Patient taking differently: Place 1 spray into both nostrils daily as needed for allergies. ) 16 g 2  . ketoconazole (NIZORAL) 2 % shampoo Apply 1 application topically 2 (two) times a week. (Patient taking differently: Apply 1 application topically at bedtime. ) 120 mL 0  . levothyroxine (SYNTHROID, LEVOTHROID) 50 MCG tablet Take 1 tablet (50 mcg total) by mouth daily. 90 tablet 1  . linaGLIPtin (TRADJENTA PO) Take 5 mg by mouth daily.    Marland Kitchen loratadine (CLARITIN) 10 MG tablet Take 10 mg by mouth daily as needed for allergies.     . midodrine (PROAMATINE) 10 MG tablet Take 10 mg by mouth 3 (three) times daily.    . Multiple Vitamins-Minerals (MULTIVITAMIN  WITH MINERALS) tablet Take 1 tablet by mouth daily.    . Naftifine HCl (NAFTIN) 2 % CREA Apply to feet daily for fungal infection (Patient taking differently: Apply 1 application topically daily as needed (for fungal infection). Apply to feet) 60 g prn  . Nutritional Supplements (FEEDING SUPPLEMENT, NEPRO CARB STEADY,) LIQD Take 237 mLs by mouth daily.    Marland Kitchen omeprazole (PRILOSEC) 40 MG capsule Take 1 capsule (40 mg total) by mouth daily. 30 capsule 0  . polyethylene glycol (MIRALAX / GLYCOLAX) packet Take 17 g by mouth daily as needed for mild constipation.     Marland Kitchen RENVELA 800 MG tablet Take 1,600 mg by mouth 3 (three) times daily with meals.   0  . simvastatin (ZOCOR) 20 MG tablet Take 20 mg by mouth at bedtime.     . vitamin B-12 (CYANOCOBALAMIN) 1000 MCG tablet Take 1 tablet (1,000 mcg total) by mouth daily. 30 tablet 0  . Blood Glucose Monitoring Suppl (FREESTYLE FREEDOM LITE) w/Device KIT Use to check blood sugar 2 times per day dx code E11.65 1 each 0  . glucose blood (FREESTYLE LITE) test strip Use as instructed to check blood sugar 3 times daily. 200 each 12  . glucose blood (ONETOUCH VERIO) test strip Use to check blood sugar 2 times per day. Dx code E11.65 100 each 2  . Insulin Pen Needle  31G X 5 MM MISC Use with Victoza pen once a day 30 each 1  . Lancets (FREESTYLE) lancets Use as instructed to check blood sugar 2 times per day dx code E11.65 100 each 3  . liraglutide (VICTOZA) 18 MG/3ML SOPN Inject 0.1 mLs (0.6 mg total) into the skin daily. (Patient not taking: Reported on 07/21/2018) 2 pen 1  . memantine (NAMENDA TITRATION PACK) tablet pack 5 mg/day for =1 week; 5 mg twice daily for =1 week; 15 mg/day given in 5 mg and 10 mg separated doses for =1 week; then 10 mg twice daily 49 tablet 0  . nystatin-triamcinolone (MYCOLOG II) cream Apply 1 application topically 2 (two) times daily. (Patient not taking: Reported on 07/21/2018) 30 g 0    Drug Regimen Review  Drug regimen was reviewed and remains appropriate with no significant issues identified  Home: Home Living Family/patient expects to be discharged to:: Private residence Living Arrangements: Spouse/significant other Type of Home: House Home Layout: Two level Bathroom Shower/Tub: Tub/shower unit Additional Comments: history taken from chart review, patient only alert/ oriented to self so unreliable historian at this time   Functional History: Prior Function Comments: unclear  Functional Status:  Mobility: Bed Mobility Overal bed mobility: Needs Assistance Bed Mobility: Supine to Sit Supine to sit: Min assist Sit to supine: Min assist General bed mobility comments: min HHA to elevate trunk from surface, increased time Transfers Overall transfer level: Needs assistance Equipment used: Rolling walker (2 wheeled) Transfers: Sit to/from Stand Sit to Stand: Min guard General transfer comment: minguard to rise from bed and chair without armrests Ambulation/Gait Ambulation/Gait assistance: Mod assist Gait Distance (Feet): 90 Feet Assistive device: Rolling walker (2 wheeled), 1 person hand held assist Gait Pattern/deviations: Trunk flexed, Shuffle General Gait Details: pt with very short shuffling steps today,  tendency for posterior bias with flexed trunk and too posterior to walker. Pt walked 10' then reported dizziness and requested to sit. BP stable. 2nd trial walked 24' with max cues for posture, position in RW with assist to direct and control RW as well as maintain anterior translation. Pt complaining  of Rt wrist pain and attempted 10' without RW with HHA but pt continues to require mod assist and reach out for environment with free hand Gait velocity: decreased Gait velocity interpretation: <1.31 ft/sec, indicative of household ambulator    ADL:    Cognition: Cognition Overall Cognitive Status: No family/caregiver present to determine baseline cognitive functioning Orientation Level: Oriented to person, Oriented to place, Disoriented to time, Disoriented to situation Cognition Arousal/Alertness: Awake/alert Behavior During Therapy: Flat affect Overall Cognitive Status: No family/caregiver present to determine baseline cognitive functioning Area of Impairment: Orientation, Attention, Memory, Following commands, Safety/judgement, Awareness, Problem solving Orientation Level: Disoriented to, Situation Current Attention Level: Sustained Memory: Decreased short-term memory Following Commands: Follows one step commands consistently Safety/Judgement: Decreased awareness of deficits Awareness: Intellectual Problem Solving: Slow processing General Comments: pt oriented to place, day, situation and responding appropriately throughout session   Blood pressure 118/76, pulse 74, temperature 98.8 F (37.1 C), resp. rate 13, height _0  (1.803 m), weight 63.9 kg, SpO2 100 %. Physical Exam  Nursing note and vitals reviewed. Constitutional: He appears lethargic. He is sleeping. He is easily aroused. No distress.  Thin male who was initially disoriented due to being awakened.   HENT:  Bilateral crani incisions C/D/I with staples.   Eyes: Pupils are equal, round, and reactive to light. EOM are  normal.  Neck: Normal range of motion. No tracheal deviation present. No thyromegaly present.  Cardiovascular: Normal rate and regular rhythm. Exam reveals gallop.  No murmur heard. Respiratory: Effort normal. No respiratory distress. He has no wheezes. He has rales.  GI: Soft. He exhibits no distension. There is no tenderness.  Musculoskeletal: He exhibits no edema.  Multiple scabbed areas of varying ages on BLE.   Neurological: He is easily aroused. He appears lethargic.  Soft voice. Oriented to self and place but unable to recall situation without cues. He was slower to initiate and noted to be sleepy.  Right gaze preference with left inattention and ? visual field deficits. Question diplopia. Able to follow simple one step motor commands. Impaired STM. Moves all 4's. 3-4/5 prox to distal. No focl sensory deficits.  Skin: He is not diaphoretic.    Results for orders placed or performed during the hospital encounter of 07/20/18 (from the past 48 hour(s))  Glucose, capillary     Status: Abnormal   Collection Time: 07/27/18  9:25 PM  Result Value Ref Range   Glucose-Capillary 101 (H) 70 - 99 mg/dL  Glucose, capillary     Status: None   Collection Time: 07/28/18  7:57 AM  Result Value Ref Range   Glucose-Capillary 96 70 - 99 mg/dL  Renal function panel     Status: Abnormal   Collection Time: 07/28/18  8:30 AM  Result Value Ref Range   Sodium 130 (L) 135 - 145 mmol/L   Potassium 4.1 3.5 - 5.1 mmol/L   Chloride 93 (L) 98 - 111 mmol/L   CO2 25 22 - 32 mmol/L   Glucose, Bld 135 (H) 70 - 99 mg/dL   BUN 38 (H) 8 - 23 mg/dL   Creatinine, Ser 7.09 (H) 0.61 - 1.24 mg/dL   Calcium 10.1 8.9 - 10.3 mg/dL   Phosphorus 5.0 (H) 2.5 - 4.6 mg/dL   Albumin 2.4 (L) 3.5 - 5.0 g/dL   GFR calc non Af Amer 7 (L) >60 mL/min   GFR calc Af Amer 8 (L) >60 mL/min    Comment: (NOTE) The eGFR has been calculated using the CKD EPI  equation. This calculation has not been validated in all clinical  situations. eGFR's persistently <60 mL/min signify possible Chronic Kidney Disease.    Anion gap 12 5 - 15    Comment: Performed at [facility] [address], [place], [place] [address]  CBC     Status: Abnormal   Collection Time: 07/28/18  8:30 AM  Result Value Ref Range   WBC 9.1 4.0 - 10.5 K/uL   RBC 3.58 (L) 4.22 - 5.81 MIL/uL   Hemoglobin 11.4 (L) 13.0 - 17.0 g/dL   HCT 34.4 (L) 39.0 - 52.0 %   MCV 96.1 80.0 - 100.0 fL   MCH 31.8 26.0 - 34.0 pg   MCHC 33.1 30.0 - 36.0 g/dL   RDW 15.1 11.5 - 15.5 %   Platelets 190 150 - 400 K/uL   nRBC 0.0 0.0 - 0.2 %    Comment: Performed at [facility], [place] [address] [place], [place] [address]  Glucose, capillary     Status: Abnormal   Collection Time: 07/28/18  1:52 PM  Result Value Ref Range   Glucose-Capillary 127 (H) 70 - 99 mg/dL  Glucose, capillary     Status: Abnormal   Collection Time: 07/28/18  4:15 PM  Result Value Ref Range   Glucose-Capillary 214 (H) 70 - 99 mg/dL  Glucose, capillary     Status: None   Collection Time: 07/28/18  9:37 PM  Result Value Ref Range   Glucose-Capillary 93 70 - 99 mg/dL  Glucose, capillary     Status: None   Collection Time: 07/29/18  7:34 AM  Result Value Ref Range   Glucose-Capillary 98 70 - 99 mg/dL  Glucose, capillary     Status: Abnormal   Collection Time: 07/29/18 12:16 PM  Result Value Ref Range   Glucose-Capillary 121 (H) 70 - 99 mg/dL   Ct Head Wo Contrast  Result Date: 07/28/2018 CLINICAL DATA:  77 year old male with intracranial hemorrhage. Follow-up. Subsequent encounter. EXAM: CT HEAD WITHOUT CONTRAST TECHNIQUE: Contiguous axial images were obtained from the base of the skull through the vertex without intravenous contrast. COMPARISON:  07/24/2018. FINDINGS: Brain: Right-sided subdural drain has been removed. Slight decrease in amount of pneumocephalus. Broad-based complex left-sided loculated appearing subdural collection with maximal thickness left frontal region  measuring up to 19 mm versus prior 22.8 mm. Left-sided subdural collection causes local mass effect which is most prominent left frontal region which is compressed posteriorly. Mass-effect upon the left frontal horn left lateral ventricle with midline shift to the right by 4.7 mm similar to prior exam. Complex right-sided subdural hematoma with maximal thickness of 8.9 mm versus prior 13.8 mm. Local mass effect slightly improved. No new intracranial hemorrhage noted. No evidence of acute thrombotic infarct. No primary intracranial mass lesion noted on this unenhanced exam. Vascular: No hyperdense vessel. Skull: Burr-holes frontal region. Sinuses/Orbits: No acute orbital abnormality. Visualized paranasal sinuses are clear. Other: Mastoid air cells and middle ear cavities are clear. IMPRESSION: 1. Right subdural drain has been removed. Slight decrease in degree of pneumocephalus. 2. Broad-based complex left-sided loculated appearing subdural collection with maximal thickness left frontal region measuring up to 19 mm versus prior 22.8 mm. Left-sided subdural collection causes local mass effect which is most prominent left frontal region which is compressed posteriorly. Mass-effect upon the left frontal horn of the left lateral ventricle with midline shift to the right by 4.7 mm similar to prior exam. 3. Complex right-sided subdural hematoma with maximal thickness of  8.9 mm versus prior 13.8 mm. Local mass effect slightly improved. Electronically Signed   By: Genia Del M.D.   On: 07/28/2018 18:28       Medical Problem List and Plan: 1.  Functional and cognitive deficits secondary to bi-frontal-temporal SDH's.    -admit to inpatient rehab  -personally reviewed most recent head CT's which demonstrate some modest improvement. Continue close clinical observation and repeat CT scans as necessary. NS following along.  2.  DVT Prophylaxis/Anticoagulation: Mechanical: Sequential compression devices, below knee  Bilateral lower extremities 3. Pain Management:tylenol pron 4. Mood: LCSW to follow for evaluation and support.  5. Neuropsych: This patient is not capable of making decisions on his own behalf. 6. Skin/Wound Care: routine pressure relief measures.  7. Fluids/Electrolytes/Nutrition: Monitor I/O. Labs with HD 8. ESRD: HD MWF at end of the day to help with tolerance of therapy.  9. CAD: Stable   10 T2DM: Monitor BS ac/hs and use SSI for elevated BS. Was on Victoza at home due to multiple hypoglycemic episodes with long acting insulin.  11. Seizure prophylaxis: to continue Keppra bid for 3 more days.  12. H/O IBS: Will discontinue laxatives due to reports of incontinence due to diarrhea:  13. Hyponatremia: Monitor  Trend as could contribute to cognitive issues. 14. Dementia: On Nameda and Aricept.  15. Loose stools. Wife reports history of loose and then constipated stools. They questioned whether it was med related. At the moment patient is on 3 softeners/laxatives  -hold all of these  -add probiotic (?IBS)   Post Admission Physician Evaluation: 1. Functional deficits secondary  to bilateral SDH's. 2. Patient is admitted to receive collaborative, interdisciplinary care between the physiatrist, rehab nursing staff, and therapy team. 3. Patient's level of medical complexity and substantial therapy needs in context of that medical necessity cannot be provided at a lesser intensity of care such as a SNF. 4. Patient has experienced substantial functional loss from his/her baseline which was documented above under the "Functional History" and "Functional Status" headings.  Judging by the patient's diagnosis, physical exam, and functional history, the patient has potential for functional progress which will result in measurable gains while on inpatient rehab.  These gains will be of substantial and practical use upon discharge  in facilitating mobility and self-care at the household  level. 5. Physiatrist will provide 24 hour management of medical needs as well as oversight of the therapy plan/treatment and provide guidance as appropriate regarding the interaction of the two. 6. The Preadmission Screening has been reviewed and patient status is unchanged unless otherwise stated above. 7. 24 hour rehab nursing will assist with bladder management, bowel management, safety, skin/wound care, disease management, medication administration, pain management and patient education  and help integrate therapy concepts, techniques,education, etc. 8. PT will assess and treat for/with: Lower extremity strength, range of motion, stamina, balance, functional mobility, safety, adaptive techniques and equipment, NMR, visual-spatial awareness, family ed.   Goals are: supervision. 9. OT will assess and treat for/with: ADL's, functional mobility, safety, upper extremity strength, adaptive techniques and equipment, NMR, visual-spatial awareness, family ed.   Goals are: supervision. Therapy may proceed with showering this patient. 10. SLP will assess and treat for/with: cognition, communication, family ed.  Goals are: supervision to min assist. 11. Case Management and Social Worker will assess and treat for psychological issues and discharge planning. 12. Team conference will be held weekly to assess progress toward goals and to determine barriers to discharge. 13. Patient will receive at least  3 hours of therapy per day at least 5 days per week. 14. ELOS: 13-18 days       15. Prognosis:  good   I have personally performed a face to face diagnostic evaluation of this patient and formulated the key components of the plan.  Additionally, I have personally reviewed laboratory data, imaging studies, as well as relevant notes and concur with the physician assistant's documentation above.  Meredith Staggers, MD, [facility]    Bary Leriche, PA-C 07/29/2018

## 2018-07-29 NOTE — Progress Notes (Signed)
Physical Medicine and Rehabilitation Consult   Reason for Consult: B-SDH with functional deficits Referring Physician: Dr.    Harrison Mons: [name] is a 77 y.o. male with history of ESRD-hemodialysis MWF, T2DM, recent GIB due to AVM, chronic hypotension, dementia, OSA; who was admitted to [facility] on 07/20/2018 with worsening of confusion headaches and multiple falls.  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  He was evaluated by Dr. Ellene Route and underwent bilateral bur hole evacuation of SDH on 10/29.  Swallow function within normal limits and patient on regular textures.  Therapy ongoing and patient is showing improvement in balance as well as improvement in ability to follow commands.  CIR recommended due to functional deficits   ROS        Past Medical History:  Diagnosis Date  . Allergy   . Anemia   . Arthritis   . Cataract    bil cateracts removed  . Coronary artery disease   . Diabetes mellitus    Type 2  . Diverticulitis   . ED (erectile dysfunction)   . 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 ESRD (end stage renal disease) on dialysis ([place]) 03/2015  . GERD (gastroesophageal reflux disease)    pepto   . Gout   . Hyperlipidemia   . Hypertension   . Hypothyroidism   . Pneumonia   . PVD (peripheral vascular disease) ([place])    has plastic aorta  . Renal insufficiency   . Seasonal allergies   . Shortness of breath dyspnea   . Sleep apnea    does not wear c-pap  . Thyroid disease          Past Surgical History:  Procedure Laterality Date  . aortobifemoral bypass    . AV FISTULA PLACEMENT Left 12/01/2013   Procedure: ARTERIOVENOUS (AV) FISTULA CREATION- LEFT BRACHIOCEPHALIC;  Surgeon: Angelia Mould, MD;  Location: [place];  Service: Vascular;  Laterality: Left;  . [place] TRANSPOSITION Right 07/27/2014   Procedure: BASCILIC VEIN TRANSPOSITION;   Surgeon: Angelia Mould, MD;  Location: [place];  Service: Vascular;  Laterality: Right;  . BREAST SURGERY     left - granulomatous mastitis  . BURR HOLE Bilateral 07/22/2018   Procedure: BILATERAL BURR HOLES;  Surgeon: Kristeen Miss, MD;  Location: [place];  Service: Neurosurgery;  Laterality: Bilateral;  . COLONOSCOPY    . 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 ENTEROSCOPY N/A 06/03/2018   Procedure: ENTEROSCOPY;  Surgeon: Lavena Bullion, DO;  Location: [place];  Service: Gastroenterology;  Laterality: N/A;  . EYE SURGERY Bilateral    cataracts  . HEMODIALYSIS INPATIENT  01/17/2018      . HOT HEMOSTASIS N/A 06/03/2018   Procedure: HOT HEMOSTASIS (ARGON PLASMA COAGULATION/BICAP);  Surgeon: Lavena Bullion, DO;  Location: [facility] ENDOSCOPY;  Service: Gastroenterology;  Laterality: N/A;  . REVISON OF ARTERIOVENOUS FISTULA Left 02/09/2014   Procedure: REVISON OF LEFT ARTERIOVENOUS FISTULA - RESECTION OF RENDUNDANT VEIN;  Surgeon: Angelia Mould, MD;  Location: [place];  Service: Vascular;  Laterality: Left;  . SBO with lysis adhesions    . SHUNTOGRAM Left 04/19/2014   Procedure: FISTULOGRAM;  Surgeon: Angelia Mould, MD;  Location: [facility] CATH LAB;  Service: Cardiovascular;  Laterality: Left;  . UNILATERAL UPPER EXTREMEITY ANGIOGRAM N/A 07/12/2014   Procedure: UNILATERAL UPPER Anselmo Rod;  Surgeon: Angelia Mould, MD;  Location: [facility]  CATH LAB;  Service: Cardiovascular;  Laterality: N/A;         Family History  Problem Relation Age of Onset  . Aneurysm Mother   . Heart disease Father   . Stroke Father   . Hypertension Father   . Diabetes Father   . Dementia Neg Hx   . Colon cancer Neg Hx   . Esophageal cancer Neg Hx   . Pancreatic cancer Neg Hx   . Prostate cancer Neg Hx   . Rectal cancer Neg Hx   . Stomach cancer Neg Hx     Social History:  reports that he quit smoking about 23 years ago. He has never  used smokeless tobacco. He reports that he does not drink alcohol or use drugs.         Allergies  Allergen Reactions  . Penicillins Rash    Has patient had a PCN reaction causing immediate rash, facial/tongue/throat swelling, SOB or lightheadedness with hypotension: Yes Has patient had a PCN reaction causing severe rash involving mucus membranes or skin necrosis: Yes Has patient had a PCN reaction that required hospitalization: No Has patient had a PCN reaction occurring within the last 10 years: No If all of the above answers are "NO", then may proceed with Cephalosporin use.           Medications Prior to Admission  Medication Sig Dispense Refill  . acetaminophen (TYLENOL) 500 MG tablet Take 1,000 mg by mouth 2 (two) times daily as needed (for pain).     Marland Kitchen allopurinol (ZYLOPRIM) 100 MG tablet Take 100 mg by mouth daily as needed (pain).     Marland Kitchen aspirin 325 MG tablet Take 325 mg by mouth daily.     . Carboxymethylcellulose Sod PF 0.25 % SOLN Place 1 drop into both eyes as needed (dry eyes).    . clobetasol (TEMOVATE) 0.05 % external solution Apply 1 application topically 2 (two) times daily as needed (irritation). Apply to scalp  0  . denosumab (PROLIA) 60 MG/ML SOSY injection Inject 60 mg into the skin every 6 (six) months.    . donepezil (ARICEPT) 10 MG tablet Take 1 tablet (10 mg total) by mouth at bedtime. 30 tablet 11  . FLUOCINOLONE ACETONIDE SCALP 0.01 % OIL Apply 1 application topically at bedtime. Apply to scalp  0  . fluticasone (CUTIVATE) 0.05 % cream Apply 1 application topically 2 (two) times daily as needed (irritation). Apply to face  0  . fluticasone (FLONASE) 50 MCG/ACT nasal spray Place 1 spray into both nostrils daily. (Patient taking differently: Place 1 spray into both nostrils daily as needed for allergies. ) 16 g 2  . ketoconazole (NIZORAL) 2 % shampoo Apply 1 application topically 2 (two) times a week. (Patient taking differently: Apply 1  application topically at bedtime. ) 120 mL 0  . levothyroxine (SYNTHROID, LEVOTHROID) 50 MCG tablet Take 1 tablet (50 mcg total) by mouth daily. 90 tablet 1  . linaGLIPtin (TRADJENTA PO) Take 5 mg by mouth daily.    Marland Kitchen loratadine (CLARITIN) 10 MG tablet Take 10 mg by mouth daily as needed for allergies.     . midodrine (PROAMATINE) 10 MG tablet Take 10 mg by mouth 3 (three) times daily.    . Multiple Vitamins-Minerals (MULTIVITAMIN WITH MINERALS) tablet Take 1 tablet by mouth daily.    . Naftifine HCl (NAFTIN) 2 % CREA Apply to feet daily for fungal infection (Patient taking differently: Apply 1 application topically daily as needed (for fungal infection).  Apply to feet) 60 g prn  . Nutritional Supplements (FEEDING SUPPLEMENT, NEPRO CARB STEADY,) LIQD Take 237 mLs by mouth daily.    Marland Kitchen omeprazole (PRILOSEC) 40 MG capsule Take 1 capsule (40 mg total) by mouth daily. 30 capsule 0  . polyethylene glycol (MIRALAX / GLYCOLAX) packet Take 17 g by mouth daily as needed for mild constipation.     Marland Kitchen RENVELA 800 MG tablet Take 1,600 mg by mouth 3 (three) times daily with meals.   0  . simvastatin (ZOCOR) 20 MG tablet Take 20 mg by mouth at bedtime.     . vitamin B-12 (CYANOCOBALAMIN) 1000 MCG tablet Take 1 tablet (1,000 mcg total) by mouth daily. 30 tablet 0  . Blood Glucose Monitoring Suppl (FREESTYLE FREEDOM LITE) w/Device KIT Use to check blood sugar 2 times per day dx code E11.65 1 each 0  . glucose blood (FREESTYLE LITE) test strip Use as instructed to check blood sugar 3 times daily. 200 each 12  . glucose blood (ONETOUCH VERIO) test strip Use to check blood sugar 2 times per day. Dx code E11.65 100 each 2  . Insulin Pen Needle 31G X 5 MM MISC Use with Victoza pen once a day 30 each 1  . Lancets (FREESTYLE) lancets Use as instructed to check blood sugar 2 times per day dx code E11.65 100 each 3  . liraglutide (VICTOZA) 18 MG/3ML SOPN Inject 0.1 mLs (0.6 mg total) into the skin daily.  (Patient not taking: Reported on 07/21/2018) 2 pen 1  . memantine (NAMENDA TITRATION PACK) tablet pack 5 mg/day for =1 week; 5 mg twice daily for =1 week; 15 mg/day given in 5 mg and 10 mg separated doses for =1 week; then 10 mg twice daily 49 tablet 0  . nystatin-triamcinolone (MYCOLOG II) cream Apply 1 application topically 2 (two) times daily. (Patient not taking: Reported on 07/21/2018) 30 g 0    Home: Home Living Family/patient expects to be discharged to:: Private residence Living Arrangements: Spouse/significant other Type of Home: House Home Layout: Two level Bathroom Shower/Tub: Tub/shower unit Additional Comments: history taken from chart review, patient only alert/ oriented to self so unreliable historian at this time  Functional History: Prior Function Comments: unclear Functional Status:  Mobility: Bed Mobility Overal bed mobility: Needs Assistance Bed Mobility: Supine to Sit Supine to sit: Min assist Sit to supine: Min assist General bed mobility comments: min assist to move legs to EOB and elevate trunk Transfers Overall transfer level: Needs assistance Equipment used: Rolling walker (2 wheeled) Transfers: Sit to/from Stand Sit to Stand: Min assist, Min guard General transfer comment: min assist to rise from bed, minguard to rise from toilet with rail, increased trials to initiate standing on each attempt  Ambulation/Gait Ambulation/Gait assistance: Mod assist Gait Distance (Feet): 120 Feet Assistive device: Rolling walker (2 wheeled) Gait Pattern/deviations: Step-through pattern, Decreased stride length General Gait Details: cues for posture, position in RW, 3 standing rest breaks, cues for direction to room. Pt then walked 2x 15' after hall ambulation to bathroom Gait velocity: decreased Gait velocity interpretation: <1.8 ft/sec, indicate of risk for recurrent falls  ADL:  Cognition: Cognition Overall Cognitive Status: No family/caregiver present to  determine baseline cognitive functioning Orientation Level: Oriented to person, Oriented to place Cognition Arousal/Alertness: Awake/alert Behavior During Therapy: Flat affect Overall Cognitive Status: No family/caregiver present to determine baseline cognitive functioning Area of Impairment: Orientation, Attention, Memory, Following commands, Safety/judgement, Awareness, Problem solving Orientation Level: Disoriented to, Place, Time, Situation Current Attention  Level: Selective Memory: Decreased short-term memory Following Commands: Follows one step commands consistently Safety/Judgement: Decreased awareness of deficits Awareness: Intellectual Problem Solving: Slow processing General Comments: pt oriented to place, day, situation and responding appropriately throughout session   Blood pressure (!) 124/56, pulse 79, temperature 98 F (36.7 C), temperature source Oral, resp. rate 14, height _0  (1.803 m), weight 67.5 kg, SpO2 100 %. Physical Exam  LabResultsLast24Hours       Results for orders placed or performed during the hospital encounter of 07/20/18 (from the past 24 hour(s))  Glucose, capillary     Status: Abnormal   Collection Time: 07/24/18  5:42 PM  Result Value Ref Range   Glucose-Capillary 111 (H) 70 - 99 mg/dL   Comment 1 Notify RN    Comment 2 Document in Chart   Glucose, capillary     Status: Abnormal   Collection Time: 07/24/18  9:30 PM  Result Value Ref Range   Glucose-Capillary 154 (H) 70 - 99 mg/dL  CBC     Status: Abnormal   Collection Time: 07/25/18  6:23 AM  Result Value Ref Range   WBC 7.7 4.0 - 10.5 K/uL   RBC 3.54 (L) 4.22 - 5.81 MIL/uL   Hemoglobin 11.0 (L) 13.0 - 17.0 g/dL   HCT 35.3 (L) 39.0 - 52.0 %   MCV 99.7 80.0 - 100.0 fL   MCH 31.1 26.0 - 34.0 pg   MCHC 31.2 30.0 - 36.0 g/dL   RDW 15.7 (H) 11.5 - 15.5 %   Platelets 167 150 - 400 K/uL   nRBC 0.3 (H) 0.0 - 0.2 %  Basic metabolic panel     Status: Abnormal    Collection Time: 07/25/18  6:23 AM  Result Value Ref Range   Sodium 134 (L) 135 - 145 mmol/L   Potassium 3.4 (L) 3.5 - 5.1 mmol/L   Chloride 99 98 - 111 mmol/L   CO2 27 22 - 32 mmol/L   Glucose, Bld 90 70 - 99 mg/dL   BUN 21 8 - 23 mg/dL   Creatinine, Ser 5.48 (H) 0.61 - 1.24 mg/dL   Calcium 10.3 8.9 - 10.3 mg/dL   GFR calc non Af Amer 9 (L) >60 mL/min   GFR calc Af Amer 11 (L) >60 mL/min   Anion gap 8 5 - 15  Glucose, capillary     Status: None   Collection Time: 07/25/18  7:38 AM  Result Value Ref Range   Glucose-Capillary 91 70 - 99 mg/dL  Glucose, capillary     Status: Abnormal   Collection Time: 07/25/18  1:52 PM  Result Value Ref Range   Glucose-Capillary 120 (H) 70 - 99 mg/dL      ImagingResults(Last48hours)  Ct Head Wo Contrast  Result Date: 07/24/2018 CLINICAL DATA:  Follow-up CT for bilateral subdural hematomas, status post evacuation. EXAM: CT HEAD WITHOUT CONTRAST TECHNIQUE: Contiguous axial images were obtained from the base of the skull through the vertex without intravenous contrast. COMPARISON:  Prior CT from 07/20/2018. FINDINGS: Brain: Fairly sizable mixed attenuation bilateral subdural hematomas again seen. There has been performance of bilateral frontal burr hole craniotomy for subdural evacuation. Subdural drain extends through the right frontal burr hole with tip overlying the inferior right frontal convexity. Scattered foci of extra-axial pneumocephalus seen bilaterally, left greater than right. Since previous exam, the right subdural collection is slightly decreased in size now measuring 15 mm in maximal thickness, previously 18 mm. The left subdural collection is relatively similar measuring 15 mm  in maximal thickness. Degree of hyperdense blood products within both of these collections overall relatively similar. Associated trace left-to-right shift relatively unchanged. Stable ventricular size and morphology. No hydrocephalus or ventricular  trapping. Basilar cisterns remain patent. Underlying atrophy with chronic small vessel ischemic disease again noted. No other new acute intracranial hemorrhage. No acute large vessel territory infarct. Vascular: No hyperdense vessel. Scattered vascular calcifications noted within the carotid siphons. Skull: Postoperative changes from prior bifrontal burr hole craniotomy. Skin staples remain in place. Sinuses/Orbits: Globes and orbital soft tissues demonstrate no acute finding. Visible paranasal sinuses and mastoid air cells are clear. Other: None. IMPRESSION: 1. Postoperative changes from interval bilateral frontal burr hole craniotomy for subdural evacuation. Right-sided subdural drain in place with tip overlying the anterior right frontal convexity. Mixed attenuation bilateral subdural hematomas slightly decreased in size on the right, and relatively similar in size on the left. No significant midline shift or other complication. 2. No other new acute intracranial abnormality. Electronically Signed   By: Jeannine Boga M.D.   On: 07/24/2018 06:26      Assessment/Plan: Diagnosis: traumatic SDH's 1. Does the need for close, 24 hr/day medical supervision in concert with the patient's rehab needs make it unreasonable for this patient to be served in a less intensive setting? Yes 2. Co-Morbidities requiring supervision/potential complications: ESRD, GIB, dementia, dm2, cad 3. Due to bladder management, bowel management, safety, skin/wound care, disease management, medication administration, pain management and patient education, does the patient require 24 hr/day rehab nursing? Yes 4. Does the patient require coordinated care of a physician, rehab nurse, PT (1-2 hrs/day, 5 days/week), OT (1-2 hrs/day, 5 days/week) and SLP (1-2 hrs/day, 5 days/week) to address physical and functional deficits in the context of the above medical diagnosis(es)? Yes Addressing deficits in the following areas: balance,  endurance, locomotion, strength, transferring, bowel/bladder control, bathing, dressing, feeding, grooming, toileting, cognition and psychosocial support 5. Can the patient actively participate in an intensive therapy program of at least 3 hrs of therapy per day at least 5 days per week? Yes 6. The potential for patient to make measurable gains while on inpatient rehab is good 7. Anticipated functional outcomes upon discharge from inpatient rehab are supervision  with PT, supervision with OT, supervision and min assist with SLP. 8. Estimated rehab length of stay to reach the above functional goals is: 13-18 days 9. Anticipated D/C setting: Home 10. Anticipated post D/C treatments: [place] therapy 11. Overall Rehab/Functional Prognosis: excellent  RECOMMENDATIONS: This patient's condition is appropriate for continued rehabilitative care in the following setting: CIR Patient has agreed to participate in recommended program. Yes Note that insurance prior authorization may be required for reimbursement for recommended care.  Comment: Rehab Admissions Coordinator to follow up.  Thanks,  Meredith Staggers, MD, Mellody Drown   Bary Leriche, PA-C 07/25/2018        Revision History                        Routing History

## 2018-07-29 NOTE — Social Work (Signed)
Aware pt going to CIR today.  CSW signing off. Please consult if any additional needs arise.  [place], [place] Work [phone]

## 2018-07-29 NOTE — Discharge Summary (Signed)
[facility]  Physician Discharge Summary   Patient ID: [name] MRN: [id number] DOB/AGE: 04-02-1941 77 y.o.  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 Keppra for 3 more days 2. Neurosurgery to remove staples tomorrow at rehab 3. Patient to keep his usual hemodialysis schedule 4. Please consider discontinuing statin for patient's bilateral thigh pain does not improve. 5. Due to low HbA1c we recommend that he be taken off of his diabetic medications to avoid hypoglycemia.   DISCHARGE CONDITION: fair  Diet recommendation: Mod Carb  Filed Weights   07/25/18 1153 07/28/18 0835 07/28/18 1249  Weight: 67.5 kg 67.4 kg 63.9 kg    INITIAL HISTORY: 77 y.o.malewith medical history significant fortype 2 diabetes mellitus, hypothyroidism, dementia, and end-stage renal disease on hemodialysis, presented to the emergency department with increased confusion. Patient was admitted last month with GI bleeding and generalized weakness, was transfused, but has continued to experience generalized weakness and his wife has noted some difficulty with gait and reports that he has fallen multiple times over the last few months.  Evaluation was concerning for acute on chronic subdural hematoma.  Patient was hospitalized for further management.  He was seen by neurosurgery.  Underwent burr hole for drainage of the subdural hematomas.  Consultants: Neurosurgery.  Nephrology.  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  Patient underwent burr hole on 10/29.  Patient had drains which have been out since 10/31.   Patient underwent CT scan of the head yesterday.    With findings stable according to neurosurgery.  No further intervention planned.  Staples to be removed tomorrow.  Keppra for 3 more days.  Discussed with Dr. Ellene Route.    Multiple rib fractures Respiratory status appears to be stable.  Incentive spirometry.  Elevated blood pressure Patient's blood pressure was noted to be elevated yesterday morning.  Improved after he underwent dialysis.  Usually patient has hypotension requiring midodrine.  He has been off of midodrine during this hospitalization.      End-stage renal disease on dialysis on Monday Wednesday Friday Management per nephrology.  Dialyzed yesterday.  Discomfort bilateral thighs Poor historian.  However appears to be present mainly when he tries to lift his legs up.  Examination did not show any limitation in movement of his hip joints or knee joints.  No abnormality noted over the thighs.  Good peripheral pulses.  He does have low back pain which is chronic.  He is noted to be on a statin but he has been on it for a long time. Continue with physical therapy. If this symptom continues to interfere with his ability to ambulate then could consider discontinuing the statin.    History of dementia Patient with transient confusion overnight.  Seems to be appropriate this morning.  Could have been sundowning.  Continue donepezil and memantine.  Seen by speech therapy.  He is on regular diet.  Hypothyroidism Continue home medications.  Diabetes mellitus type 2 HbA1c 5.2.  Continue SSI.  Stop his nighttime coverage.  Encourage oral intake.  Noted to be on linagliptin and Victoza at home.  Considering his low HbA1c will recommend that these medications be discontinued to avoid the hypoglycemic episodes.  Anemia of chronic disease Hemoglobin has been stable.  No evidence of overt bleeding.  Overall stable.  Okay for discharge/transfer to inpatient  rehabilitation.     PERTINENT LABS:  The results of significant diagnostics from this hospitalization (including imaging, microbiology, ancillary and laboratory) are listed below for reference.    Microbiology: Recent Results (from the past 240 hour(s))  Surgical PCR screen     Status: None   Collection Time: 07/22/18 12:00 AM  Result Value Ref Range Status   MRSA, PCR NEGATIVE NEGATIVE Final   Staphylococcus aureus NEGATIVE NEGATIVE Final    Comment: (NOTE) The Xpert SA Assay (FDA approved for NASAL specimens in patients 18 years of age and older), is one component of a comprehensive surveillance program. It is not intended to diagnose infection nor to guide or monitor treatment. Performed at [facility], [place] [address], [place], [place] [address]      Labs: Basic Metabolic Panel: Recent Labs  Lab 07/22/18 1630 07/23/18 0839 07/25/18 0623 07/27/18 0340 07/28/18 0830  NA 135 136 134* 132* 130*  K 3.5 4.0 3.4* 4.9 4.1  CL  --  99 99 95* 93*  CO2  --  25 27 25 25   GLUCOSE 94 122* 90 93 135*  BUN  --  19 21 26* 38*  CREATININE  --  6.01* 5.48* 5.44* 7.09*  CALCIUM  --  9.8 10.3 10.0 10.1  PHOS  --  7.1*  --   --  5.0*   Liver Function Tests: Recent Labs  Lab 07/23/18 0839 07/28/18 0830  ALBUMIN 2.7* 2.4*   CBC: Recent Labs  Lab 07/22/18 1630 07/23/18 0839 07/25/18 0623 07/27/18 0340 07/28/18 0830  WBC  --  8.2 7.7 7.5 9.1  HGB 12.9* 10.8* 11.0* 11.6* 11.4*  HCT 38.0* 32.8* 35.3* 36.5* 34.4*  MCV  --  98.5 99.7 98.1 96.1  PLT  --  180 167 158 190    CBG: Recent Labs  Lab 07/28/18 1352 07/28/18 1615 07/28/18 2137 07/29/18 0734 07/29/18 1216  GLUCAP 127* 214* 93 98 121*     IMAGING STUDIES Dg Ribs Unilateral W/chest Left  Result Date: 07/20/2018 CLINICAL DATA:  Fall 2 days ago with left rib pain. EXAM: LEFT RIBS AND CHEST - 3+ VIEW COMPARISON:  None. FINDINGS: Lungs are hypoinflated without consolidation, effusion or pneumothorax.  Cardiomediastinal silhouette is within normal. Findings suggest subtle fractures of the lateral left tenth rib and anterolateral left ninth rib. Possible lateral left seventh rib fracture. IMPRESSION: No acute cardiopulmonary disease. Possible subtle fractures of the left seventh, ninth and tenth ribs as described. Electronically Signed   By: Marin Olp M.D.   On: 07/20/2018 23:37   Ct Head Wo Contrast  Result Date: 07/28/2018 CLINICAL DATA:  76 year old male with intracranial hemorrhage. Follow-up. Subsequent encounter. EXAM: CT HEAD WITHOUT CONTRAST TECHNIQUE: Contiguous axial images were obtained from the base of the skull through the vertex without intravenous contrast. COMPARISON:  07/24/2018. FINDINGS: Brain: Right-sided subdural drain has been removed. Slight decrease in amount of pneumocephalus. Broad-based complex left-sided loculated appearing subdural collection with maximal thickness left frontal region measuring up to 19 mm versus prior 22.8 mm. Left-sided subdural collection causes local mass effect which is most prominent left frontal region which is compressed posteriorly. Mass-effect upon the left frontal horn left lateral ventricle with midline shift to the right by 4.7 mm similar to prior exam. Complex right-sided subdural hematoma with maximal thickness of 8.9 mm versus prior 13.8 mm. Local  mass effect slightly improved. No new intracranial hemorrhage noted. No evidence of acute thrombotic infarct. No primary intracranial mass lesion noted on this unenhanced exam. Vascular: No hyperdense vessel. Skull: Burr-holes frontal region. Sinuses/Orbits: No acute orbital abnormality. Visualized paranasal sinuses are clear. Other: Mastoid air cells and middle ear cavities are clear. IMPRESSION: 1. Right subdural drain has been removed. Slight decrease in degree of pneumocephalus. 2. Broad-based complex left-sided loculated appearing subdural collection with maximal thickness left frontal region  measuring up to 19 mm versus prior 22.8 mm. Left-sided subdural collection causes local mass effect which is most prominent left frontal region which is compressed posteriorly. Mass-effect upon the left frontal horn of the left lateral ventricle with midline shift to the right by 4.7 mm similar to prior exam. 3. Complex right-sided subdural hematoma with maximal thickness of 8.9 mm versus prior 13.8 mm. Local mass effect slightly improved. Electronically Signed   By: Genia Del M.D.   On: 07/28/2018 18:28   Ct Head Wo Contrast  Result Date: 07/24/2018 CLINICAL DATA:  Follow-up CT for bilateral subdural hematomas, status post evacuation. EXAM: CT HEAD WITHOUT CONTRAST TECHNIQUE: Contiguous axial images were obtained from the base of the skull through the vertex without intravenous contrast. COMPARISON:  Prior CT from 07/20/2018. FINDINGS: Brain: Fairly sizable mixed attenuation bilateral subdural hematomas again seen. There has been performance of bilateral frontal burr hole craniotomy for subdural evacuation. Subdural drain extends through the right frontal burr hole with tip overlying the inferior right frontal convexity. Scattered foci of extra-axial pneumocephalus seen bilaterally, left greater than right. Since previous exam, the right subdural collection is slightly decreased in size now measuring 15 mm in maximal thickness, previously 18 mm. The left subdural collection is relatively similar measuring 15 mm in maximal thickness. Degree of hyperdense blood products within both of these collections overall relatively similar. Associated trace left-to-right shift relatively unchanged. Stable ventricular size and morphology. No hydrocephalus or ventricular trapping. Basilar cisterns remain patent. Underlying atrophy with chronic small vessel ischemic disease again noted. No other new acute intracranial hemorrhage. No acute large vessel territory infarct. Vascular: No hyperdense vessel. Scattered vascular  calcifications noted within the carotid siphons. Skull: Postoperative changes from prior bifrontal burr hole craniotomy. Skin staples remain in place. Sinuses/Orbits: Globes and orbital soft tissues demonstrate no acute finding. Visible paranasal sinuses and mastoid air cells are clear. Other: None. IMPRESSION: 1. Postoperative changes from interval bilateral frontal burr hole craniotomy for subdural evacuation. Right-sided subdural drain in place with tip overlying the anterior right frontal convexity. Mixed attenuation bilateral subdural hematomas slightly decreased in size on the right, and relatively similar in size on the left. No significant midline shift or other complication. 2. No other new acute intracranial abnormality. Electronically Signed   By: Jeannine Boga M.D.   On: 07/24/2018 06:26   Ct Head Wo Contrast  Result Date: 07/20/2018 CLINICAL DATA:  77 y/o M; altered mental status. Unclear cause. Several falls today. EXAM: CT HEAD WITHOUT CONTRAST TECHNIQUE: Contiguous axial images were obtained from the base of the skull through the vertex without intravenous contrast. COMPARISON:  04/12/2015 MRI head. FINDINGS: Brain: Bilateral subdural hematomas on the right measuring up to 18 mm in thickness on the left up to 15 mm in thickness (series 5, image 37). The subdural hematomas are largely hypointense, however, there is gradient increased attenuation of the right-sided collection and and high attenuation retracted clot in the left-sided collection indicating recent hemorrhage. Mass effect on the brain results in diffuse  sulcal effacement. There is no midline shift or downward herniation this time. No findings of stroke, brain parenchymal hemorrhage, or focal mass effect of the brain. Moderate volume loss of the brain is stable from prior MRI of the head given differences in technique. There is a punctate calcification in the right frontal lobe compatible sequelae of prior infectious or  inflammatory process. Vascular: Calcific atherosclerosis of the carotid siphons and vertebral arteries. No hyperdense vessel identified. Skull: Normal. Negative for fracture or focal lesion. Sinuses/Orbits: No acute finding. Other: Bilateral intra-ocular lens replacement. IMPRESSION: Large mixed attenuation bilateral subdural hematomas measuring up to 18 mm in thickness on the right and 15 mm on the left, likely acute on chronic. Mass effect on the brain with concave deformation and sulcal effacement. No midline shift or herniation. Critical Value/emergent results were called by telephone at the time of interpretation on 07/20/2018 at 11:55 pm to Dr. Quincy Carnes , who verbally acknowledged these results. Electronically Signed   By: [name] M.D.   On: 07/20/2018 23:57    DISCHARGE EXAMINATION: See progress note from earlier today  DISPOSITION: CIR     Current Inpatient Medications:  Scheduled: . Chlorhexidine Gluconate Cloth  6 each Topical Q0600  . [START ON 07/30/2018] Chlorhexidine Gluconate Cloth  6 each Topical Q0600  . cinacalcet  30 mg Oral Q supper  . docusate sodium  100 mg Oral BID  . donepezil  10 mg Oral QHS  . feeding supplement (NEPRO CARB STEADY)  237 mL Oral BID BM  . feeding supplement (PRO-STAT SUGAR FREE 64)  30 mL Oral BID  . insulin aspart  0-9 Units Subcutaneous TID WC  . levETIRAcetam  500 mg Oral BID  . levothyroxine  50 mcg Oral QAC breakfast  . memantine  10 mg Oral BID  . multivitamin  1 tablet Oral QHS  . pantoprazole  40 mg Oral Daily  . polyethylene glycol  17 g Oral Daily  . senna  1 tablet Oral BID  . sevelamer carbonate  800 mg Oral Q supper  . simvastatin  20 mg Oral QHS  . sodium chloride flush  3 mL Intravenous Q12H  . sodium chloride flush  3 mL Intravenous Q12H  . vitamin B-12  1,000 mcg Oral Daily   Continuous: . sodium chloride    . sodium chloride Stopped (07/25/18 0830)  . dextrose Stopped (07/27/18 0800)   TOI:ZTIWPY  chloride, acetaminophen **OR** acetaminophen, bisacodyl, diphenhydrAMINE, fentaNYL (SUBLIMAZE) injection, labetalol, ondansetron **OR** ondansetron (ZOFRAN) IV, polyethylene glycol, polyvinyl alcohol, promethazine, sodium chloride flush, sodium phosphate    TOTAL DISCHARGE TIME: 35 minutes  [facility] Pager [phone]  07/29/2018, 2:42 PM

## 2018-07-29 NOTE — Progress Notes (Signed)
[facility] PROGRESS NOTE  [name] WEX:[id number] DOB: September 20, 1941 DOA: 07/20/2018  PCP: Elby Showers, MD  Brief History/Interval Summary: 77 y.o.malewith medical history significant fortype 2 diabetes mellitus, hypothyroidism, dementia, and end-stage renal disease on hemodialysis, presented to the emergency department with increased confusion. Patient was admitted last month with GI bleeding and generalized weakness, was transfused, but has continued to experience generalized weakness and his wife has noted some difficulty with gait and reports that he has fallen multiple times over the last few months.  Evaluation was concerning for acute on chronic subdural hematoma.  Patient was hospitalized for further management.  He was seen by neurosurgery.  Underwent burr hole for drainage of the subdural hematomas.  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  Nephrology.  Procedures: Bilateral burr holes for drainage of subdural hematomas  Antibiotics: None  Subjective/Interval History: Overnight events noted.  Patient got confused overnight.  Seems to be appropriate this morning.  Denies any headaches.  No chest pain or shortness of breath.    Objective:  Vital Signs  Vitals:   07/28/18 1944 07/28/18 2322 07/29/18 0546 07/29/18 0821  BP: (!) 107/54 (!) 107/54 108/71 (!) 136/105  Pulse: 82  79 85  Resp:  18 (!) 21   Temp: 97.8 F (36.6 C) 98.5 F (36.9 C) 97.9 F (36.6 C) 98.7 F (37.1 C)  TempSrc: Oral Oral Oral   SpO2: 100%  100%   Weight:      Height:        Intake/Output Summary (Last 24 hours) at 07/29/2018 0958 Last data filed at 07/28/2018 1800 Gross per 24 hour  Intake 840 ml  Output 2001 ml  Net -1161 ml   Filed Weights   07/25/18 1153 07/28/18 0835 07/28/18 1249  Weight: 67.5 kg 67.4 kg 63.9 kg    General appearance: Awake alert.  Perhaps slightly distracted.  In no distress. Resp: Normal  effort at rest.  Clear to auscultation bilaterally. Cardio: S1-S2 is normal regular.  No S3-S4.  No rubs murmurs or bruit GI: Diminished soft.  Nontender nondistended Extremities: No deformity or abnormality noted over the thigh area in both lower extremities.  Able to lift both his legs.  Good peripheral pulses.  Neurologic: He is awake alert.  He knew he was in the hospital.  He knew he was in [place].  He knew the name of the president.  Did not get the year correct.  Did get the month correct.  Moving all extremities.  No obvious focal deficits noted.  Lab Results:  Data Reviewed: I have personally reviewed following labs and imaging studies  CBC: Recent Labs  Lab 07/22/18 1630 07/23/18 0839 07/25/18 0623 07/27/18 0340 07/28/18 0830  WBC  --  8.2 7.7 7.5 9.1  HGB 12.9* 10.8* 11.0* 11.6* 11.4*  HCT 38.0* 32.8* 35.3* 36.5* 34.4*  MCV  --  98.5 99.7 98.1 96.1  PLT  --  180 167 158 938    Basic Metabolic Panel: Recent Labs  Lab 07/22/18 1630 07/23/18 0839 07/25/18 0623 07/27/18 0340 07/28/18 0830  NA 135 136 134* 132* 130*  K 3.5 4.0 3.4* 4.9 4.1  CL  --  99 99 95* 93*  CO2  --  25 27 25 25   GLUCOSE 94 122* 90 93 135*  BUN  --  19 21 26* 38*  CREATININE  --  6.01* 5.48* 5.44* 7.09*  CALCIUM  --  9.8 10.3 10.0 10.1  PHOS  --  7.1*  --   --  5.0*    GFR: Estimated Creatinine Clearance: 7.9 mL/min (A) (by C-G formula based on SCr of 7.09 mg/dL (H)).  Liver Function Tests: Recent Labs  Lab 07/23/18 0839 07/28/18 0830  ALBUMIN 2.7* 2.4*    CBG: Recent Labs  Lab 07/28/18 0757 07/28/18 1352 07/28/18 1615 07/28/18 2137 07/29/18 0734  GLUCAP 96 127* 214* 93 98     Recent Results (from the past 240 hour(s))  Surgical PCR screen     Status: None   Collection Time: 07/22/18 12:00 AM  Result Value Ref Range Status   MRSA, PCR NEGATIVE NEGATIVE Final   Staphylococcus aureus NEGATIVE NEGATIVE Final    Comment: (NOTE) The Xpert SA Assay (FDA approved for  NASAL specimens in patients 58 years of age and older), is one component of a comprehensive surveillance program. It is not intended to diagnose infection nor to guide or monitor treatment. Performed at [facility], [place] [address], [place], [place] [address]       Radiology Studies: Ct Head Wo Contrast  Result Date: 07/28/2018 CLINICAL DATA:  77 year old male with intracranial hemorrhage. Follow-up. Subsequent encounter. EXAM: CT HEAD WITHOUT CONTRAST TECHNIQUE: Contiguous axial images were obtained from the base of the skull through the vertex without intravenous contrast. COMPARISON:  07/24/2018. FINDINGS: Brain: Right-sided subdural drain has been removed. Slight decrease in amount of pneumocephalus. Broad-based complex left-sided loculated appearing subdural collection with maximal thickness left frontal region measuring up to 19 mm versus prior 22.8 mm. Left-sided subdural collection causes local mass effect which is most prominent left frontal region which is compressed posteriorly. Mass-effect upon the left frontal horn left lateral ventricle with midline shift to the right by 4.7 mm similar to prior exam. Complex right-sided subdural hematoma with maximal thickness of 8.9 mm versus prior 13.8 mm. Local mass effect slightly improved. No new intracranial hemorrhage noted. No evidence of acute thrombotic infarct. No primary intracranial mass lesion noted on this unenhanced exam. Vascular: No hyperdense vessel. Skull: Burr-holes frontal region. Sinuses/Orbits: No acute orbital abnormality. Visualized paranasal sinuses are clear. Other: Mastoid air cells and middle ear cavities are clear. IMPRESSION: 1. Right subdural drain has been removed. Slight decrease in degree of pneumocephalus. 2. Broad-based complex left-sided loculated appearing subdural collection with maximal thickness left frontal region measuring up to 19 mm versus prior 22.8 mm. Left-sided subdural collection causes local mass  effect which is most prominent left frontal region which is compressed posteriorly. Mass-effect upon the left frontal horn of the left lateral ventricle with midline shift to the right by 4.7 mm similar to prior exam. 3. Complex right-sided subdural hematoma with maximal thickness of 8.9 mm versus prior 13.8 mm. Local mass effect slightly improved. Electronically Signed   By: Genia Del M.D.   On: 07/28/2018 18:28     Medications:  Scheduled: . Chlorhexidine Gluconate Cloth  6 each Topical Q0600  . docusate sodium  100 mg Oral BID  . donepezil  10 mg Oral QHS  . feeding supplement (NEPRO CARB STEADY)  237 mL Oral BID BM  . feeding supplement (PRO-STAT SUGAR FREE 64)  30 mL Oral BID  . insulin aspart  0-5 Units Subcutaneous QHS  . insulin aspart  0-9 Units Subcutaneous TID WC  . levothyroxine  50 mcg Oral QAC breakfast  . memantine  10 mg Oral BID  . multivitamin  1 tablet Oral QHS  . pantoprazole  40 mg Oral Daily  .  polyethylene glycol  17 g Oral Daily  . senna  1 tablet Oral BID  . sevelamer carbonate  800 mg Oral Q supper  . simvastatin  20 mg Oral QHS  . sodium chloride flush  3 mL Intravenous Q12H  . sodium chloride flush  3 mL Intravenous Q12H  . vitamin B-12  1,000 mcg Oral Daily   Continuous: . sodium chloride    . sodium chloride Stopped (07/25/18 0830)  . dextrose Stopped (07/27/18 0800)  . 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  Patient underwent burr hole on 10/29.  Patient had drains which have been out since 10/31.  Patient underwent CT scan of the head yesterday.  Further management per neurosurgery.  Patient  remains on Keppra per neurosurgery.     Multiple rib fractures Respiratory status appears to be stable.  Incentive spirometry.  Elevated blood pressure Patient's blood pressure was noted to be elevated yesterday morning.  Improved after he underwent dialysis.  Usually patient has hypotension requiring midodrine.  He has been off of midodrine during this hospitalization.  Continue to monitor closely.    End-stage renal disease on dialysis on Monday Wednesday Friday Management per nephrology.  Dialyzed yesterday.  Discomfort bilateral thighs Poor historian.  However appears to be present mainly when he tries to lift his legs up.  Examination did not show any limitation in movement of his hip joints or knee joints.  No abnormality noted over the thighs.  Good peripheral pulses.  He does have low back pain which is chronic.  He is noted to be on a statin but he has been on it for a long time.  Discussed with Dr. Justin Mend.  Continue with physical therapy. If this symptom continues to interfere with his ability to ambulate then could consider discontinuing the statin.    History of dementia Patient with transient confusion overnight.  Seems to be appropriate this morning.  Could have been sundowning.  Continue donepezil and memantine.  Seen by speech therapy.  He is on regular diet.  Hypothyroidism Continue home medications.  Diabetes mellitus type 2 HbA1c 5.2.  Continue SSI.  Stop his nighttime coverage.  Encourage oral intake.  Noted to be on linagliptin and Victoza at home.  Considering his low HbA1c will recommend that these medications be discontinued to avoid the hypoglycemic episodes.  Anemia of chronic disease Hemoglobin has been stable.  No evidence of overt bleeding.  DVT Prophylaxis: SCDs    Code Status: Full code Family Communication: Discussed with the patient.  No family at bedside today. Disposition Plan: Await neurosurgery input regarding the CT scan done yesterday evening and  disposition plans.  Patient being considered for inpatient rehabilitation.      LOS: 8 days   [facility] Pager [phone] 07/29/2018, 9:58 AM  If 7PM-7AM, please contact night-coverage at [email or web address], password [facility]

## 2018-07-29 NOTE — Progress Notes (Signed)
Patient admitted about 1730 to 23M02 with wife at bedside. Patient given rehab notebook and rehab process discussed with patient and family. All questions answered. Continue with plan of care.

## 2018-07-29 NOTE — Progress Notes (Signed)
Report given to [place] RN on rehab. Patient transferring to 4M02. VSS. Wife at bedside

## 2018-07-29 NOTE — Progress Notes (Signed)
Physical Therapy Treatment Patient Details Name: [name] MRN: [id number] DOB: 11/07/1940 Today's Date: 07/29/2018    History of Present Illness 77 y.o. male with medical history significant for type 2 diabetes mellitus, hypothyroidism, dementia, and end-stage renal disease on hemodialysis, presented with increased confusion and history of falls at home.   Imaging revealed Bil SDH with modest mass-effect causing concave deformity, multi rib fx on left.. S/p burr holes 10/29    PT Comments    Pt pleasant and reports fatigue today with decreased activity tolerance. Pt continues to require assist for all mobility with encouragement and direction to perform activity. Pt remains a high fall risk due to slow shuffling gait with flexed trunk and posterior bias. Pt will continue to benefit from therapy to maximize function and safety to decrease burden of care.  BP pre session 117/99 supine, in chair with report of dizziness 136/105, HR 85 SpO2 100% on RA    Follow Up Recommendations  CIR;Supervision/Assistance - 24 hour     Equipment Recommendations  Rolling walker with 5" wheels    Recommendations for Other Services       Precautions / Restrictions Precautions Precautions: Fall    Mobility  Bed Mobility Overal bed mobility: Needs Assistance Bed Mobility: Supine to Sit     Supine to sit: Min assist     General bed mobility comments: min HHA to elevate trunk from surface, increased time  Transfers Overall transfer level: Needs assistance   Transfers: Sit to/from Stand Sit to Stand: Min guard         General transfer comment: minguard to rise from bed and chair without armrests  Ambulation/Gait Ambulation/Gait assistance: Mod assist Gait Distance (Feet): 90 Feet Assistive device: Rolling walker (2 wheeled);1 person hand held assist Gait Pattern/deviations: Trunk flexed;Shuffle   Gait velocity interpretation: <1.31 ft/sec, indicative of household  ambulator General Gait Details: pt with very short shuffling steps today, tendency for posterior bias with flexed trunk and too posterior to walker. Pt walked 10' then reported dizziness and requested to sit. BP stable. 2nd trial walked 41' with max cues for posture, position in RW with assist to direct and control RW as well as maintain anterior translation. Pt complaining of Rt wrist pain and attempted 10' without RW with HHA but pt continues to require mod assist and reach out for environment with free hand   Stairs             Wheelchair Mobility    Modified Rankin (Stroke Patients Only) Modified Rankin (Stroke Patients Only) Pre-Morbid Rankin Score: Slight disability Modified Rankin: Moderately severe disability     Balance Overall balance assessment: Needs assistance;History of Falls   Sitting balance-Leahy Scale: Fair     Standing balance support: Bilateral upper extremity supported;During functional activity Standing balance-Leahy Scale: Poor                              Cognition Arousal/Alertness: Awake/alert Behavior During Therapy: Flat affect Overall Cognitive Status: No family/caregiver present to determine baseline cognitive functioning Area of Impairment: Orientation;Attention;Memory;Following commands;Safety/judgement;Awareness;Problem solving                 Orientation Level: Disoriented to;Situation Current Attention Level: Sustained Memory: Decreased short-term memory Following Commands: Follows one step commands consistently Safety/Judgement: Decreased awareness of deficits   Problem Solving: Slow processing        Exercises General Exercises - Lower Extremity Long Arc Quad: AROM;15 reps;Seated;Both  Hip Flexion/Marching: AROM;15 reps;Seated;Both    General Comments        Pertinent Vitals/Pain Faces Pain Scale: Hurts little more Pain Location: left thigh soreness Pain Intervention(s): Limited activity within patient's  tolerance;Monitored during session    Home Living                      Prior Function            PT Goals (current goals can now be found in the care plan section) Progress towards PT goals: Progressing toward goals    Frequency           PT Plan Current plan remains appropriate    Co-evaluation              AM-PAC PT "6 Clicks" Daily Activity  Outcome Measure  Difficulty turning over in bed (including adjusting bedclothes, sheets and blankets)?: A Lot Difficulty moving from lying on back to sitting on the side of the bed? : Unable Difficulty sitting down on and standing up from a chair with arms (e.g., wheelchair, bedside commode, etc,.)?: A Lot Help needed moving to and from a bed to chair (including a wheelchair)?: A Little Help needed walking in hospital room?: A Lot Help needed climbing 3-5 steps with a railing? : A Lot 6 Click Score: 12    End of Session Equipment Utilized During Treatment: Gait belt Activity Tolerance: Patient tolerated treatment well Patient left: in chair;with call bell/phone within reach;with chair alarm set Nurse Communication: Mobility status PT Visit Diagnosis: Muscle weakness (generalized) (M62.81);Repeated falls (R29.6);Other abnormalities of gait and mobility (R26.89)     Time: 8325-4982 PT Time Calculation (min) (ACUTE ONLY): 28 min  Charges:  $Gait Training: 8-22 mins $Therapeutic Activity: 8-22 mins                     [place], PT Acute Rehabilitation Services Pager: [phone] Office: [place] 07/29/2018, 8:26 AM

## 2018-07-29 NOTE — IPOC Note (Signed)
Overall Plan of Care [facility]) Patient Details Name: ADVAITH LAMARQUE MRN: [id number] DOB: [birth year]  Admitting Diagnosis: TBI  Hospital Problems: Active Problems:   TBI (traumatic brain injury) ([place])   Diabetes mellitus type 2 in nonobese ([place])   Loose stools   Dementia arising in the senium and presenium ([place])   Labile blood pressure   Seizure prophylaxis   Anemia of chronic disease     Functional Problem List: Nursing Medication Management, Bowel, Skin Integrity  PT Balance, Endurance, Motor, Safety  OT Balance, Cognition, Edema, Endurance, Motor, Safety, Pain  SLP Cognition  TR         Basic ADL's: OT Grooming, Bathing, Dressing, Toileting     Advanced  ADL's: OT       Transfers: PT Bed Mobility, Bed to Chair, Car, Manufacturing systems engineer, Metallurgist: PT Ambulation, Data processing manager, Emergency planning/management officer     Additional Impairments: OT None  SLP Social Cognition   Problem Solving, Memory, Attention, Awareness  TR      Anticipated Outcomes Item Anticipated Outcome  Self Feeding n/a  Swallowing      Basic self-care  supervision  Toileting  supervision   Bathroom Transfers supervision  Bowel/Bladder  min assist  Transfers  S  Locomotion  S with LRAD  Communication     Cognition  supervision   Pain  less than 3 out of 10  Safety/Judgment  Min assist   Therapy Plan: PT Intensity: Minimum of 1-2 x/day ,45 to 90 minutes PT Frequency: 5 out of 7 days PT Duration Estimated Length of Stay: 12-14 days OT Intensity: Minimum of 1-2 x/day, 45 to 90 minutes OT Frequency: 5 out of 7 days OT Duration/Estimated Length of Stay: ~13-14 days SLP Intensity: Minumum of 1-2 x/day, 30 to 90 minutes SLP Frequency: 3 to 5 out of 7 days SLP Duration/Estimated Length of Stay: anticipated d/c date of 11/20    Team Interventions: Nursing Interventions Patient/Family Education, Skin Care/Wound Management, Pain Management, Bowel Management, Medication Management   PT interventions Cognitive remediation/compensation, Ambulation/gait training, Training and development officer, Community reintegration, Discharge planning, Disease management/prevention, Functional mobility training, DME/adaptive equipment instruction, Pain management, Wheelchair propulsion/positioning, UE/LE Coordination activities, Therapeutic Exercise, Stair training, Patient/family education, Neuromuscular re-education, Splinting/orthotics, Therapeutic Activities, UE/LE Strength taining/ROM  OT Interventions Training and development officer, Community reintegration, Disease mangement/prevention, Technical sales engineer stimulation, Neuromuscular re-education, Patient/family education, Self Care/advanced ADL retraining, Splinting/orthotics, Therapeutic Exercise, Cognitive remediation/compensation, Discharge planning, DME/adaptive equipment instruction, Functional mobility training, Pain management, Psychosocial support, Skin care/wound managment, Therapeutic Activities, UE/LE Strength taining/ROM  SLP Interventions Cognitive remediation/compensation, Cueing hierarchy, Environmental controls, Internal/external aids, Functional tasks, Patient/family education  TR Interventions    SW/CM Interventions Discharge Planning, Psychosocial Support, Patient/Family Education   Barriers to Discharge MD  Medical stability  Nursing Incontinence    PT Home environment access/layout 8 STE  OT      SLP      SW       Team Discharge Planning: Destination: PT-Home ,OT- Home , SLP-Home Projected Follow-up: PT-Home health PT, OT-  Home health OT, 24 hour supervision/assistance, SLP-Home Health SLP, 24 hour supervision/assistance Projected Equipment Needs: PT-To be determined, OT- To be determined, SLP-None recommended by SLP Equipment Details: PT- , OT-  Patient/family involved in discharge planning: PT- Patient,  OT-Patient, SLP-Patient  MD ELOS: 10-14 days. Medical Rehab Prognosis:  Good Assessment: 77 year old  male with history of ESRD-HD MWF, T2DM, recent GI bleed due to AVM, chronic hypertension, dementia, OSA; who was admitted to [facility]  Hospital on 07/20/2018 with worsening of confusion, headaches and multiple falls. 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 He was evaluated by Dr. Ellene Route and underwent bilateral bur hole evacuation of subdural hemorrhage on 1029. Swallow function within normal limits and patient on regular textures. He has had worsening of cognitive status with confusion with sleep-wake disruption.CT of head repeatedrevealing improvement inrightsubdural hemorrhageand left residual SDH now withmass-effect left frontal region.Dr. Ellene Route recommends monitoring patient clinically with intermittent CT scan. Blood pressures trending high and midodrine on hold.Patient continues to have bouts of lethargy with slow shuffling gait and balance deficits. Will set goals for Supervision with PT/OT and Min A with SLP.   See Team Conference Notes for weekly updates to the plan of care

## 2018-07-29 NOTE — Progress Notes (Signed)
Patient ID: [name], male   DOB: June 12, 1941, 77 y.o.   MRN: [id number] Vital signs have been and remained stable CT done yesterday demonstrates that both subdurals are smaller however the left one still remains slightly larger and is now created some left to right shift of only 4 mm There is no evidence of any significant pressure on the brain surfaces His right side looks substantially better I believe that he can go to rehab and will continue to follow the subdurals clinically with intermittent CT scans down the road I will remove the staples tomorrow.

## 2018-07-29 NOTE — Progress Notes (Signed)
Inpatient Rehabilitation-Admissions Coordinator   Spoke with Almyra Brace in Dialysis via phone to inform her that the pt is to be admitted to CIR today and will be on our rehab unit.   Jhonnie Garner, OTR/L  Rehab Admissions Coordinator  [phone] 07/29/2018 5:32 PM

## 2018-07-29 NOTE — Care Management Important Message (Signed)
Important Message  Patient Details  Name: [name] MRN: [id number] Date of Birth: 10/18/1940   Medicare Important Message Given:  Yes    Orbie Pyo 07/29/2018, 3:02 PM

## 2018-07-29 NOTE — H&P (Signed)
Physical Medicine and Rehabilitation Admission H&P        Chief Complaint  Patient presents with  . TBI      HPI: [name] is a 77 year old male with history of ESRD-HD MWF, T2DM, recent GI bleed due to AVM, chronic hypertension, dementia, OSA; who was admitted to [facility] on 07/20/2018 with worsening of confusion, headaches and multiple falls.  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  He was evaluated by Dr. Ellene Route and underwent bilateral bur hole evacuation of subdural hemorrhage on 1029.  Swallow function within normal limits and patient on regular textures.  He has had worsening of cognitive status with confusion for the past couple of days with sleep-wake disruption. CT of head repeated yesterday revealing improvement in right subdural hemorrhage and left residual SDH now with mass-effect left frontal region.  Dr. Ellene Route recommends monitoring patient clinically with intermittent CT scan.  Blood pressures trending high and midodrine on hold.  Patient continues to have bouts of lethargy with slow shuffling gait and balance deficits.  CIR recommended for follow-up therapy     ROS          Past Medical History:  Diagnosis Date  . Allergy    . Anemia    . Arthritis    . Cataract      bil cateracts removed  . Coronary artery disease    . Diabetes mellitus      Type 2  . Diverticulitis    . ED (erectile dysfunction)    . Elevated homocysteine ([place])    . ESRD (end stage renal disease) on dialysis ([place]) 03/2015  . GERD (gastroesophageal reflux disease)      pepto   . Gout    . Hyperlipidemia    . Hypertension    . Hypothyroidism    . Pneumonia    . PVD (peripheral vascular disease) ([place])      has plastic aorta  . Renal insufficiency    . Seasonal allergies    . Shortness of breath dyspnea    . Sleep apnea      does not wear c-pap  . Thyroid disease           Past Surgical  History:  Procedure Laterality Date  . aortobifemoral bypass      . AV FISTULA PLACEMENT Left 12/01/2013    Procedure: ARTERIOVENOUS (AV) FISTULA CREATION- LEFT BRACHIOCEPHALIC;  Surgeon: Angelia Mould, MD;  Location: [place];  Service: Vascular;  Laterality: Left;  . [place] TRANSPOSITION Right 07/27/2014    Procedure: BASCILIC VEIN TRANSPOSITION;  Surgeon: Angelia Mould, MD;  Location: [place];  Service: Vascular;  Laterality: Right;  . BREAST SURGERY        left - granulomatous mastitis  . BURR HOLE Bilateral 07/22/2018    Procedure: BILATERAL BURR HOLES;  Surgeon: Kristeen Miss, MD;  Location: [place];  Service: Neurosurgery;  Laterality: Bilateral;  . COLONOSCOPY      . 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 ENTEROSCOPY N/A 06/03/2018    Procedure: ENTEROSCOPY;  Surgeon: Lavena Bullion, DO;  Location: [place];  Service: Gastroenterology;  Laterality: N/A;  . EYE SURGERY Bilateral      cataracts  . HEMODIALYSIS INPATIENT   01/17/2018       . HOT HEMOSTASIS N/A 06/03/2018    Procedure: HOT HEMOSTASIS (  ARGON PLASMA COAGULATION/BICAP);  Surgeon: Lavena Bullion, DO;  Location: [facility] ENDOSCOPY;  Service: Gastroenterology;  Laterality: N/A;  . REVISON OF ARTERIOVENOUS FISTULA Left 02/09/2014    Procedure: REVISON OF LEFT ARTERIOVENOUS FISTULA - RESECTION OF RENDUNDANT VEIN;  Surgeon: Angelia Mould, MD;  Location: [place];  Service: Vascular;  Laterality: Left;  . SBO with lysis adhesions      . SHUNTOGRAM Left 04/19/2014    Procedure: FISTULOGRAM;  Surgeon: Angelia Mould, MD;  Location: [facility] CATH LAB;  Service: Cardiovascular;  Laterality: Left;  . UNILATERAL UPPER EXTREMEITY ANGIOGRAM N/A 07/12/2014    Procedure: UNILATERAL UPPER Anselmo Rod;  Surgeon: Angelia Mould, MD;  Location: [facility] CATH LAB;  Service: Cardiovascular;  Laterality: N/A;           Family History  Problem Relation Age of Onset  . Aneurysm Mother    . Heart  disease Father    . Stroke Father    . Hypertension Father    . Diabetes Father    . Dementia Neg Hx    . Colon cancer Neg Hx    . Esophageal cancer Neg Hx    . Pancreatic cancer Neg Hx    . Prostate cancer Neg Hx    . Rectal cancer Neg Hx    . Stomach cancer Neg Hx        Social History:  Married. Retired from the [employer]. He reports that he quit smoking about 23 years ago. He has never used smokeless tobacco. He reports that he does not drink alcohol or use drugs.           Allergies  Allergen Reactions  . Penicillins Rash      Has patient had a PCN reaction causing immediate rash, facial/tongue/throat swelling, SOB or lightheadedness with hypotension: Yes Has patient had a PCN reaction causing severe rash involving mucus membranes or skin necrosis: Yes Has patient had a PCN reaction that required hospitalization: No Has patient had a PCN reaction occurring within the last 10 years: No If all of the above answers are "NO", then may proceed with Cephalosporin use.            Medications Prior to Admission  Medication Sig Dispense Refill  . acetaminophen (TYLENOL) 500 MG tablet Take 1,000 mg by mouth 2 (two) times daily as needed (for pain).       Marland Kitchen allopurinol (ZYLOPRIM) 100 MG tablet Take 100 mg by mouth daily as needed (pain).       Marland Kitchen aspirin 325 MG tablet Take 325 mg by mouth daily.       . Carboxymethylcellulose Sod PF 0.25 % SOLN Place 1 drop into both eyes as needed (dry eyes).      . clobetasol (TEMOVATE) 0.05 % external solution Apply 1 application topically 2 (two) times daily as needed (irritation). Apply to scalp   0  . denosumab (PROLIA) 60 MG/ML SOSY injection Inject 60 mg into the skin every 6 (six) months.      . donepezil (ARICEPT) 10 MG tablet Take 1 tablet (10 mg total) by mouth at bedtime. 30 tablet 11  . FLUOCINOLONE ACETONIDE SCALP 0.01 % OIL Apply 1 application topically at bedtime. Apply to scalp   0  . fluticasone (CUTIVATE) 0.05 % cream Apply 1  application topically 2 (two) times daily as needed (irritation). Apply to face   0  . fluticasone (FLONASE) 50 MCG/ACT nasal spray Place 1 spray into both nostrils daily. (Patient taking  differently: Place 1 spray into both nostrils daily as needed for allergies. ) 16 g 2  . ketoconazole (NIZORAL) 2 % shampoo Apply 1 application topically 2 (two) times a week. (Patient taking differently: Apply 1 application topically at bedtime. ) 120 mL 0  . levothyroxine (SYNTHROID, LEVOTHROID) 50 MCG tablet Take 1 tablet (50 mcg total) by mouth daily. 90 tablet 1  . linaGLIPtin (TRADJENTA PO) Take 5 mg by mouth daily.      Marland Kitchen loratadine (CLARITIN) 10 MG tablet Take 10 mg by mouth daily as needed for allergies.       . midodrine (PROAMATINE) 10 MG tablet Take 10 mg by mouth 3 (three) times daily.      . Multiple Vitamins-Minerals (MULTIVITAMIN WITH MINERALS) tablet Take 1 tablet by mouth daily.      . Naftifine HCl (NAFTIN) 2 % CREA Apply to feet daily for fungal infection (Patient taking differently: Apply 1 application topically daily as needed (for fungal infection). Apply to feet) 60 g prn  . Nutritional Supplements (FEEDING SUPPLEMENT, NEPRO CARB STEADY,) LIQD Take 237 mLs by mouth daily.      Marland Kitchen omeprazole (PRILOSEC) 40 MG capsule Take 1 capsule (40 mg total) by mouth daily. 30 capsule 0  . polyethylene glycol (MIRALAX / GLYCOLAX) packet Take 17 g by mouth daily as needed for mild constipation.       Marland Kitchen RENVELA 800 MG tablet Take 1,600 mg by mouth 3 (three) times daily with meals.    0  . simvastatin (ZOCOR) 20 MG tablet Take 20 mg by mouth at bedtime.       . vitamin B-12 (CYANOCOBALAMIN) 1000 MCG tablet Take 1 tablet (1,000 mcg total) by mouth daily. 30 tablet 0  . Blood Glucose Monitoring Suppl (FREESTYLE FREEDOM LITE) w/Device KIT Use to check blood sugar 2 times per day dx code E11.65 1 each 0  . glucose blood (FREESTYLE LITE) test strip Use as instructed to check blood sugar 3 times daily. 200 each 12    . glucose blood (ONETOUCH VERIO) test strip Use to check blood sugar 2 times per day. Dx code E11.65 100 each 2  . Insulin Pen Needle 31G X 5 MM MISC Use with Victoza pen once a day 30 each 1  . Lancets (FREESTYLE) lancets Use as instructed to check blood sugar 2 times per day dx code E11.65 100 each 3  . liraglutide (VICTOZA) 18 MG/3ML SOPN Inject 0.1 mLs (0.6 mg total) into the skin daily. (Patient not taking: Reported on 07/21/2018) 2 pen 1  . memantine (NAMENDA TITRATION PACK) tablet pack 5 mg/day for =1 week; 5 mg twice daily for =1 week; 15 mg/day given in 5 mg and 10 mg separated doses for =1 week; then 10 mg twice daily 49 tablet 0  . nystatin-triamcinolone (MYCOLOG II) cream Apply 1 application topically 2 (two) times daily. (Patient not taking: Reported on 07/21/2018) 30 g 0      Drug Regimen Review  Drug regimen was reviewed and remains appropriate with no significant issues identified   Home: Home Living Family/patient expects to be discharged to:: Private residence Living Arrangements: Spouse/significant other Type of Home: House Home Layout: Two level Bathroom Shower/Tub: Tub/shower unit Additional Comments: history taken from chart review, patient only alert/ oriented to self so unreliable historian at this time   Functional History: Prior Function Comments: unclear   Functional Status:  Mobility: Bed Mobility Overal bed mobility: Needs Assistance Bed Mobility: Supine to Sit Supine to sit:  Min assist Sit to supine: Min assist General bed mobility comments: min HHA to elevate trunk from surface, increased time Transfers Overall transfer level: Needs assistance Equipment used: Rolling walker (2 wheeled) Transfers: Sit to/from Stand Sit to Stand: Min guard General transfer comment: minguard to rise from bed and chair without armrests Ambulation/Gait Ambulation/Gait assistance: Mod assist Gait Distance (Feet): 90 Feet Assistive device: Rolling walker (2 wheeled),  1 person hand held assist Gait Pattern/deviations: Trunk flexed, Shuffle General Gait Details: pt with very short shuffling steps today, tendency for posterior bias with flexed trunk and too posterior to walker. Pt walked 10' then reported dizziness and requested to sit. BP stable. 2nd trial walked 48' with max cues for posture, position in RW with assist to direct and control RW as well as maintain anterior translation. Pt complaining of Rt wrist pain and attempted 10' without RW with HHA but pt continues to require mod assist and reach out for environment with free hand Gait velocity: decreased Gait velocity interpretation: <1.31 ft/sec, indicative of household ambulator   ADL:   Cognition: Cognition Overall Cognitive Status: No family/caregiver present to determine baseline cognitive functioning Orientation Level: Oriented to person, Oriented to place, Disoriented to time, Disoriented to situation Cognition Arousal/Alertness: Awake/alert Behavior During Therapy: Flat affect Overall Cognitive Status: No family/caregiver present to determine baseline cognitive functioning Area of Impairment: Orientation, Attention, Memory, Following commands, Safety/judgement, Awareness, Problem solving Orientation Level: Disoriented to, Situation Current Attention Level: Sustained Memory: Decreased short-term memory Following Commands: Follows one step commands consistently Safety/Judgement: Decreased awareness of deficits Awareness: Intellectual Problem Solving: Slow processing General Comments: pt oriented to place, day, situation and responding appropriately throughout session     Blood pressure 118/76, pulse 74, temperature 98.8 F (37.1 C), resp. rate 13, height 5' 11"  (1.803 m), weight 63.9 kg, SpO2 100 %. Physical Exam  Nursing note and vitals reviewed. Constitutional: He appears lethargic. He is sleeping. He is easily aroused. No distress.  Thin male who was initially disoriented due to  being awakened.   HENT:  Bilateral crani incisions C/D/I with staples.   Eyes: Pupils are equal, round, and reactive to light. EOM are normal.  Neck: Normal range of motion. No tracheal deviation present. No thyromegaly present.  Cardiovascular: Normal rate and regular rhythm. Exam reveals gallop.  No murmur heard. Respiratory: Effort normal. No respiratory distress. He has no wheezes. He has rales.  GI: Soft. He exhibits no distension. There is no tenderness.  Musculoskeletal: He exhibits no edema.  Multiple scabbed areas of varying ages on BLE.   Neurological: He is easily aroused. He appears lethargic.  Soft voice. Oriented to self and place but unable to recall situation without cues. He was slower to initiate and noted to be sleepy.  Right gaze preference with left inattention and ? visual field deficits. Question diplopia. Able to follow simple one step motor commands. Impaired STM. Moves all 4's. 3-4/5 prox to distal. No focl sensory deficits.  Skin: He is not diaphoretic.      Lab Results Last 48 Hours        Results for orders placed or performed during the hospital encounter of 07/20/18 (from the past 48 hour(s))  Glucose, capillary     Status: Abnormal    Collection Time: 07/27/18  9:25 PM  Result Value Ref Range    Glucose-Capillary 101 (H) 70 - 99 mg/dL  Glucose, capillary     Status: None    Collection Time: 07/28/18  7:57 AM  Result  Value Ref Range    Glucose-Capillary 96 70 - 99 mg/dL  Renal function panel     Status: Abnormal    Collection Time: 07/28/18  8:30 AM  Result Value Ref Range    Sodium 130 (L) 135 - 145 mmol/L    Potassium 4.1 3.5 - 5.1 mmol/L    Chloride 93 (L) 98 - 111 mmol/L    CO2 25 22 - 32 mmol/L    Glucose, Bld 135 (H) 70 - 99 mg/dL    BUN 38 (H) 8 - 23 mg/dL    Creatinine, Ser 7.09 (H) 0.61 - 1.24 mg/dL    Calcium 10.1 8.9 - 10.3 mg/dL    Phosphorus 5.0 (H) 2.5 - 4.6 mg/dL    Albumin 2.4 (L) 3.5 - 5.0 g/dL    GFR calc non Af Amer 7 (L) >60  mL/min    GFR calc Af Amer 8 (L) >60 mL/min      Comment: (NOTE) The eGFR has been calculated using the CKD EPI equation. This calculation has not been validated in all clinical situations. eGFR's persistently <60 mL/min signify possible Chronic Kidney Disease.      Anion gap 12 5 - 15      Comment: Performed at [facility] [address], [place], [place] [address]  CBC     Status: Abnormal    Collection Time: 07/28/18  8:30 AM  Result Value Ref Range    WBC 9.1 4.0 - 10.5 K/uL    RBC 3.58 (L) 4.22 - 5.81 MIL/uL    Hemoglobin 11.4 (L) 13.0 - 17.0 g/dL    HCT 34.4 (L) 39.0 - 52.0 %    MCV 96.1 80.0 - 100.0 fL    MCH 31.8 26.0 - 34.0 pg    MCHC 33.1 30.0 - 36.0 g/dL    RDW 15.1 11.5 - 15.5 %    Platelets 190 150 - 400 K/uL    nRBC 0.0 0.0 - 0.2 %      Comment: Performed at [facility], [place] [address], [place],  [address]  Glucose, capillary     Status: Abnormal    Collection Time: 07/28/18  1:52 PM  Result Value Ref Range    Glucose-Capillary 127 (H) 70 - 99 mg/dL  Glucose, capillary     Status: Abnormal    Collection Time: 07/28/18  4:15 PM  Result Value Ref Range    Glucose-Capillary 214 (H) 70 - 99 mg/dL  Glucose, capillary     Status: None    Collection Time: 07/28/18  9:37 PM  Result Value Ref Range    Glucose-Capillary 93 70 - 99 mg/dL  Glucose, capillary     Status: None    Collection Time: 07/29/18  7:34 AM  Result Value Ref Range    Glucose-Capillary 98 70 - 99 mg/dL  Glucose, capillary     Status: Abnormal    Collection Time: 07/29/18 12:16 PM  Result Value Ref Range    Glucose-Capillary 121 (H) 70 - 99 mg/dL       Imaging Results (Last 48 hours)  Ct Head Wo Contrast   Result Date: 07/28/2018 CLINICAL DATA:  77 year old male with intracranial hemorrhage. Follow-up. Subsequent encounter. EXAM: CT HEAD WITHOUT CONTRAST TECHNIQUE: Contiguous axial images were obtained from the base of the skull through the vertex without intravenous  contrast. COMPARISON:  07/24/2018. FINDINGS: Brain: Right-sided subdural drain has been removed. Slight decrease in amount of pneumocephalus. Broad-based complex left-sided loculated appearing subdural  collection with maximal thickness left frontal region measuring up to 19 mm versus prior 22.8 mm. Left-sided subdural collection causes local mass effect which is most prominent left frontal region which is compressed posteriorly. Mass-effect upon the left frontal horn left lateral ventricle with midline shift to the right by 4.7 mm similar to prior exam. Complex right-sided subdural hematoma with maximal thickness of 8.9 mm versus prior 13.8 mm. Local mass effect slightly improved. No new intracranial hemorrhage noted. No evidence of acute thrombotic infarct. No primary intracranial mass lesion noted on this unenhanced exam. Vascular: No hyperdense vessel. Skull: Burr-holes frontal region. Sinuses/Orbits: No acute orbital abnormality. Visualized paranasal sinuses are clear. Other: Mastoid air cells and middle ear cavities are clear. IMPRESSION: 1. Right subdural drain has been removed. Slight decrease in degree of pneumocephalus. 2. Broad-based complex left-sided loculated appearing subdural collection with maximal thickness left frontal region measuring up to 19 mm versus prior 22.8 mm. Left-sided subdural collection causes local mass effect which is most prominent left frontal region which is compressed posteriorly. Mass-effect upon the left frontal horn of the left lateral ventricle with midline shift to the right by 4.7 mm similar to prior exam. 3. Complex right-sided subdural hematoma with maximal thickness of 8.9 mm versus prior 13.8 mm. Local mass effect slightly improved. Electronically Signed   By: Genia Del M.D.   On: 07/28/2018 18:28             Medical Problem List and Plan: 1.  Functional and cognitive deficits secondary to bi-frontal-temporal SDH's.              -admit to inpatient rehab              -personally reviewed most recent head CT's which demonstrate some modest improvement. Continue close clinical observation and repeat CT scans as necessary. NS following along.  2.  DVT Prophylaxis/Anticoagulation: Mechanical: Sequential compression devices, below knee Bilateral lower extremities 3. Pain Management:tylenol pron 4. Mood: LCSW to follow for evaluation and support.  5. Neuropsych: This patient is not capable of making decisions on his own behalf. 6. Skin/Wound Care: routine pressure relief measures.  7. Fluids/Electrolytes/Nutrition: Monitor I/O. Labs with HD 8. ESRD: HD MWF at end of the day to help with tolerance of therapy.  9. CAD: Stable   10 T2DM: Monitor BS ac/hs and use SSI for elevated BS. Was on Victoza at home due to multiple hypoglycemic episodes with long acting insulin.  11. Seizure prophylaxis: to continue Keppra bid for 3 more days.  12. H/O IBS: Will discontinue laxatives due to reports of incontinence due to diarrhea:  13. Hyponatremia: Monitor  Trend as could contribute to cognitive issues. 14. Dementia: On Nameda and Aricept.  15. Loose stools. Wife reports history of cycling loose and then constipated stools PTA. They questioned whether it was med related. While in hospital patient had been on 3 softeners/laxatives which were likely causing the problem.              -hold all of these             -add probiotic (?IBS)     Post Admission Physician Evaluation: 1. Functional deficits secondary  to bilateral SDH's. 2. Patient is admitted to receive collaborative, interdisciplinary care between the physiatrist, rehab nursing staff, and therapy team. 3. Patient's level of medical complexity and substantial therapy needs in context of that medical necessity cannot be provided at a lesser intensity of care such as a SNF. 4. Patient  has experienced substantial functional loss from his/her baseline which was documented above under the "Functional History" and  "Functional Status" headings.  Judging by the patient's diagnosis, physical exam, and functional history, the patient has potential for functional progress which will result in measurable gains while on inpatient rehab.  These gains will be of substantial and practical use upon discharge  in facilitating mobility and self-care at the household level. 5. Physiatrist will provide 24 hour management of medical needs as well as oversight of the therapy plan/treatment and provide guidance as appropriate regarding the interaction of the two. 6. The Preadmission Screening has been reviewed and patient status is unchanged unless otherwise stated above. 7. 24 hour rehab nursing will assist with bladder management, bowel management, safety, skin/wound care, disease management, medication administration, pain management and patient education  and help integrate therapy concepts, techniques,education, etc. 8. PT will assess and treat for/with: Lower extremity strength, range of motion, stamina, balance, functional mobility, safety, adaptive techniques and equipment, NMR, visual-spatial awareness, family ed.   Goals are: supervision. 9. OT will assess and treat for/with: ADL's, functional mobility, safety, upper extremity strength, adaptive techniques and equipment, NMR, visual-spatial awareness, family ed.   Goals are: supervision. Therapy may proceed with showering this patient. 10. SLP will assess and treat for/with: cognition, communication, family ed.  Goals are: supervision to min assist. 11. Case Management and Social Worker will assess and treat for psychological issues and discharge planning. 12. Team conference will be held weekly to assess progress toward goals and to determine barriers to discharge. 13. Patient will receive at least 3 hours of therapy per day at least 5 days per week. 14. ELOS: 13-18 days       15. Prognosis:  good     I have personally performed a face to face diagnostic evaluation of  this patient and formulated the key components of the plan.  Additionally, I have personally reviewed laboratory data, imaging studies, as well as relevant notes and concur with the physician assistant's documentation above.   Meredith Staggers, MD, Mellody Drown     Bary Leriche, PA-C 07/29/2018  The patient's status has not changed. The original post admission physician evaluation remains appropriate, and any changes from the pre-admission screening or documentation from the acute chart are noted above.  Meredith Staggers, MD 07/29/2018

## 2018-07-30 ENCOUNTER — Inpatient Hospital Stay (HOSPITAL_COMMUNITY): Payer: Medicare Other | Admitting: Physical Therapy

## 2018-07-30 ENCOUNTER — Inpatient Hospital Stay (HOSPITAL_COMMUNITY): Payer: Medicare Other | Admitting: Occupational Therapy

## 2018-07-30 ENCOUNTER — Inpatient Hospital Stay (HOSPITAL_COMMUNITY): Payer: Medicare Other | Admitting: Speech Pathology

## 2018-07-30 DIAGNOSIS — Z2989 Encounter for other specified prophylactic measures: Secondary | ICD-10-CM

## 2018-07-30 DIAGNOSIS — Z298 Encounter for other specified prophylactic measures: Secondary | ICD-10-CM

## 2018-07-30 DIAGNOSIS — R0989 Other specified symptoms and signs involving the circulatory and respiratory systems: Secondary | ICD-10-CM

## 2018-07-30 DIAGNOSIS — S069X0A Unspecified intracranial injury without loss of consciousness, initial encounter: Secondary | ICD-10-CM

## 2018-07-30 DIAGNOSIS — D638 Anemia in other chronic diseases classified elsewhere: Secondary | ICD-10-CM

## 2018-07-30 DIAGNOSIS — D649 Anemia, unspecified: Secondary | ICD-10-CM

## 2018-07-30 LAB — RENAL FUNCTION PANEL
Albumin: 2.6 g/dL — ABNORMAL LOW (ref 3.5–5.0)
Anion gap: 14 (ref 5–15)
BUN: 53 mg/dL — ABNORMAL HIGH (ref 8–23)
CO2: 23 mmol/L (ref 22–32)
Calcium: 10.2 mg/dL (ref 8.9–10.3)
Chloride: 95 mmol/L — ABNORMAL LOW (ref 98–111)
Creatinine, Ser: 6.7 mg/dL — ABNORMAL HIGH (ref 0.61–1.24)
GFR calc Af Amer: 8 mL/min — ABNORMAL LOW (ref >60)
GFR calc non Af Amer: 7 mL/min — ABNORMAL LOW (ref >60)
Glucose, Bld: 208 mg/dL — ABNORMAL HIGH (ref 70–99)
Phosphorus: 4.7 mg/dL — ABNORMAL HIGH (ref 2.5–4.6)
Potassium: 3.8 mmol/L (ref 3.5–5.1)
Sodium: 132 mmol/L — ABNORMAL LOW (ref 135–145)

## 2018-07-30 LAB — GLUCOSE, CAPILLARY
Glucose-Capillary: 107 mg/dL — ABNORMAL HIGH (ref 70–99)
Glucose-Capillary: 98 mg/dL (ref 70–99)
Glucose-Capillary: 125 mg/dL — ABNORMAL HIGH (ref 70–99)
Glucose-Capillary: 187 mg/dL — ABNORMAL HIGH (ref 70–99)
Glucose-Capillary: 104 mg/dL — ABNORMAL HIGH (ref 70–99)

## 2018-07-30 LAB — CBC
HCT: 37.8 % — ABNORMAL LOW (ref 39.0–52.0)
Hemoglobin: 12.2 g/dL — ABNORMAL LOW (ref 13.0–17.0)
MCH: 31.6 pg (ref 26.0–34.0)
MCHC: 32.3 g/dL (ref 30.0–36.0)
MCV: 97.9 fL (ref 80.0–100.0)
Platelets: 201 10*3/uL (ref 150–400)
RBC: 3.86 MIL/uL — ABNORMAL LOW (ref 4.22–5.81)
RDW: 15.5 % (ref 11.5–15.5)
WBC: 9 10*3/uL (ref 4.0–10.5)
nRBC: 0 % (ref 0.0–0.2)

## 2018-07-30 NOTE — Evaluation (Signed)
Occupational Therapy Assessment and Plan  Patient Details  Name: [name] MRN: [id number] Date of Birth: 10-02-1940  OT Diagnosis: cognitive deficits and muscle weakness (generalized) Rehab Potential: Rehab Potential (ACUTE ONLY): Good ELOS: ~13-14 days   Today's Date: 07/30/2018 OT Individual Time: 1791-5056 OT Individual Time Calculation (min): 60 min     Problem List:  Patient Active Problem List   Diagnosis Date Noted  . Labile blood pressure   . Seizure prophylaxis   . Anemia of chronic disease   . TBI (traumatic brain injury) ([place]) 07/29/2018  . 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 Loose stools   . Dementia arising in the senium and presenium ([place])   . 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 Multiple fractures of ribs, left side, initial encounter for closed fracture 07/21/2018  . Subdural hematoma ([place]) 07/21/2018  . Angiodysplasia of stomach   . Occult GI bleeding   . GIB (gastrointestinal bleeding) 06/01/2018  . Low back pain 01/17/2018  . Anorexia   . Dyspnea   . Macrocytic anemia 06/04/2016  . Thrombocytopenia ([place]) 06/04/2016  . Exertional dyspnea 06/04/2016  . Arm paresthesia, left 06/04/2016  . Dyspnea on exertion 06/04/2016  . Tenderness of right calf 06/04/2016  . Mild cognitive impairment with memory loss 01/12/2016  . ESRD on dialysis ([place]) 05/17/2015  . Hypoglycemia   . Weakness 02/18/2015  . OSA (obstructive sleep apnea) 02/02/2015  . Hyperkalemia 10/05/2014  . Chronic kidney disease (CKD), stage IV (severe) ([place]) 04/07/2014  . End stage renal disease ([place]) 11/18/2013  . BPH (benign prostatic hyperplasia) 11/22/2012  . Peripheral vascular disease ([place]) 12/24/2011  . Hyperparathyroidism ([place]) 12/24/2011  . Hypothyroidism 12/24/2011  . Allergic rhinitis 12/24/2011  . Erectile dysfunction 12/24/2011  . Insulin dependent diabetes mellitus ([place]) 09/03/2008  . Hyperlipidemia 09/03/2008  . Essential  hypertension 08/30/2008  . PANCREATITIS, HX OF 08/30/2008  . RENAL FAILURE, ACUTE, HX OF 08/30/2008  . DIVERTICULOSIS, COLON 06/08/2003    Past Medical History:  Past Medical History:  Diagnosis Date  . Allergy   . Anemia   . Arthritis   . Cataract    bil cateracts removed  . Coronary artery disease   . Dementia arising in the senium and presenium ([place])   . Diabetes mellitus    Type 2  . Diverticulitis   . ED (erectile dysfunction)   . Elevated homocysteine ([place])   . ESRD (end stage renal disease) on dialysis ([place]) 03/2015  . GERD (gastroesophageal reflux disease)    pepto   . Gout   . Hyperlipidemia   . Hypertension   . Hypothyroidism   . Pneumonia   . PVD (peripheral vascular disease) ([place])    has plastic aorta  . Renal insufficiency   . Seasonal allergies   . Shortness of breath dyspnea   . Sleep apnea    does not wear c-pap  . Thyroid disease    Past Surgical History:  Past Surgical History:  Procedure Laterality Date  . aortobifemoral bypass    . AV FISTULA PLACEMENT Left 12/01/2013   Procedure: ARTERIOVENOUS (AV) FISTULA CREATION- LEFT BRACHIOCEPHALIC;  Surgeon: Angelia Mould, MD;  Location: [facility];  Service: Vascular;  Laterality: Left;  . Simpson TRANSPOSITION Right 07/27/2014   Procedure: BASCILIC VEIN TRANSPOSITION;  Surgeon: Angelia Mould, MD;  Location: [facility];  Service: Vascular;  Laterality: Right;  . BREAST SURGERY     left - granulomatous mastitis  .  BURR HOLE Bilateral 07/22/2018   Procedure: BILATERAL BURR HOLES;  Surgeon: Kristeen Miss, MD;  Location: [facility];  Service: Neurosurgery;  Laterality: Bilateral;  . COLONOSCOPY    . 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 ENTEROSCOPY N/A 06/03/2018   Procedure: ENTEROSCOPY;  Surgeon: Lavena Bullion, DO;  Location: [facility];  Service: Gastroenterology;  Laterality: N/A;  . EYE SURGERY Bilateral    cataracts  . HEMODIALYSIS INPATIENT  01/17/2018      . HOT  HEMOSTASIS N/A 06/03/2018   Procedure: HOT HEMOSTASIS (ARGON PLASMA COAGULATION/BICAP);  Surgeon: Lavena Bullion, DO;  Location[facility] ENDOSCOPY;  Service: Gastroenterology;  Laterality: N/A;  . REVISON OF ARTERIOVENOUS FISTULA Left 02/09/2014   Procedure: REVISON OF LEFT ARTERIOVENOUS FISTULA - RESECTION OF RENDUNDANT VEIN;  Surgeon: Angelia Mould, MD;  Location: [facility];  Service: Vascular;  Laterality: Left;  . SBO with lysis adhesions    . SHUNTOGRAM Left 04/19/2014   Procedure: FISTULOGRAM;  Surgeon: Angelia Mould, MD;  Location: [facility] CATH LAB;  Service: Cardiovascular;  Laterality: Left;  . UNILATERAL UPPER EXTREMEITY ANGIOGRAM N/A 07/12/2014   Procedure: UNILATERAL UPPER Anselmo Rod;  Surgeon: Angelia Mould, MD;  Location: [facility] CATH LAB;  Service: Cardiovascular;  Laterality: N/A;    Assessment & Plan Clinical Impression: Patient is a 77 y.o. year old male history of ESRD-HD MWF, T2DM, recent GI bleed due to AVM, chronic hypertension, dementia, OSA; who was admitted to [facility] on 07/20/2018 with worsening of confusion, headaches and multiple falls. 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 He was evaluated by Dr. Ellene Route and underwent bilateral bur hole evacuation of subdural hemorrhage on 1029. Swallow function within normal limits and patient on regular textures. He has had worsening of cognitive status with confusion for the past couple of days with sleep-wake disruption.CT of head repeatedyesterdayrevealing improvement inrightsubdural hemorrhageand left residual SDH now withmass-effect left frontal region.Dr. Ellene Route recommends monitoring patient clinically with intermittent CT scan. Blood pressures trending high and midodrine on hold.Patient continues to have bouts of lethargy with slow shuffling gait and balance deficitsPatient transferred to CIR on 07/29/2018 .     Patient currently requires max with basic self-care skills and and mod A for basic mobility secondary to muscle weakness, decreased cardiorespiratoy endurance, unbalanced muscle activation, motor apraxia, decreased coordination and decreased motor planning, decreased attention, decreased awareness, decreased problem solving, decreased safety awareness and decreased memory and decreased standing balance, decreased postural control and decreased balance strategies.  Prior to hospitalization, patient could complete ADL with modified independent  but did present with multiple falls at home.  Patient will benefit from skilled intervention to decrease level of assist with basic self-care skills and increase independence with basic self-care skills prior to discharge home with care partner.  Anticipate patient will require 24 hour supervision and follow up home health and follow up outpatient.  OT - End of Session Activity Tolerance: Tolerates 30+ min activity with multiple rests Endurance Deficit: Yes OT Assessment Rehab Potential (ACUTE ONLY): Good OT Patient demonstrates impairments in the following area(s): Balance;Cognition;Edema;Endurance;Motor;Safety;Pain OT Basic ADL's Functional Problem(s): Grooming;Bathing;Dressing;Toileting OT Transfers Functional Problem(s): Toilet;Tub/Shower OT Additional Impairment(s): None OT Plan OT Intensity: Minimum of 1-2 x/day, 45 to 90 minutes OT Frequency: 5 out of 7 days OT Duration/Estimated Length of Stay: ~13-14 days OT Treatment/Interventions: Balance/vestibular training;Community reintegration;Disease mangement/prevention;Functional electrical stimulation;Neuromuscular re-education;Patient/family education;Self Care/advanced ADL retraining;Splinting/orthotics;Therapeutic Exercise;Cognitive  remediation/compensation;Discharge planning;DME/adaptive equipment instruction;Functional mobility training;Pain management;Psychosocial support;Skin care/wound  managment;Therapeutic Activities;UE/LE Strength taining/ROM OT Self Feeding Anticipated Outcome(s): n/a OT Basic Self-Care Anticipated Outcome(s): supervision OT Toileting Anticipated Outcome(s): supervision OT Bathroom Transfers Anticipated Outcome(s): supervision OT Recommendation Recommendations for Other Services: Neuropsych consult Patient destination: Home Follow Up Recommendations: Home health OT;24 hour supervision/assistance Equipment Recommended: To be determined   Skilled Therapeutic Intervention OT eval initiated with OT goals, purpose and role discussed with pt. Self care retraining with focus on basic mobility including bed mobility, basic transfers, sit to stands, activity tolerance, sequencing and functional problem solving. Pt does demonstrate significant endurance and activity tolerance with basic ADL tasks requiring rest break often and reports feeling exhausted with minimal activity. PT declined showering today and agrees to partial sponge bath at sink and dressing. Pt required A to orient and don shirt - pt not able to correct self once threaded both UEs in the head hole. Pt also required max A for LB clothing including shoes. Pt does report being active as he could and reports he still drove.   Engaged in eating breakfast after setup but required therapist to put food on the fork each time to prompt/ encourage pt to eat. Pt left finishing breakfast.   OT Evaluation Precautions/Restrictions  Precautions Precautions: Fall Precaution Comments: HD M-W-F Restrictions Weight Bearing Restrictions: No General Chart Reviewed: Yes OT Amount of Missed Time: 30 Minutes Family/Caregiver Present: No Vital Signs Therapy Vitals Temp: 98.1 F (36.7 C) Temp Source: Oral Pulse Rate: (!) 58 BP: 117/80 Patient Position (if appropriate): Lying Oxygen Therapy SpO2: 100 % O2 Device: Room Air Pain Pain Assessment Pain Scale: 0-10 Pain Score: 0-No pain Home Living/Prior  Functioning Home Living Family/patient expects to be discharged to:: Private residence Living Arrangements: Spouse/significant other Available Help at Discharge: Family, 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 hours/day Type of Home: House Home Access: Stairs to enter Home Layout: Two level Bathroom Shower/Tub: Tub/shower unit  Lives With: Spouse Prior Function Vocation: Retired ADL ADL Grooming: Moderate assistance Where Assessed-Grooming: Sitting at sink Upper Body Bathing: Moderate assistance Where Assessed-Upper Body Bathing: Sitting at sink Lower Body Bathing: Maximal assistance Where Assessed-Lower Body Bathing: Standing at sink Upper Body Dressing: Moderate assistance Where Assessed-Upper Body Dressing: Sitting at sink Lower Body Dressing: Maximal assistance Where Assessed-Lower Body Dressing: Sitting at sink Vision Baseline Vision/History: No visual deficits Vision Assessment?: No apparent visual deficits Perception  Perception: Impaired Spatial Orientation: difficulty with body awareness and impacted ability to organize and don clothing Praxis Praxis: Impaired Praxis Impairment Details: Motor planning Cognition Overall Cognitive Status: Impaired/Different from baseline Arousal/Alertness: Awake/alert Orientation Level: Person;Place;Situation Person: Oriented Place: Oriented Situation: Oriented Year: (unsure but out of 3 able to give correct year) Month: November Day of Week: Incorrect(Thursday) Memory: Impaired Memory Impairment: Storage deficit;Retrieval deficit Immediate Memory Recall: Sock;Blue;Bed Memory Recall: (0/3) Attention: Sustained Focused Attention: Appears intact Sustained Attention: Appears intact Selective Attention: Appears intact Awareness: Impaired Awareness Impairment: Emergent impairment Problem Solving: Impaired Problem Solving Impairment: Functional basic Executive Function: Organizing;Self Monitoring;Self Correcting Reasoning: Impaired Reasoning  Impairment: Functional basic Sequencing: Impaired Sequencing Impairment: Functional basic Organizing: Impaired Organizing Impairment: Functional basic Self Monitoring: Impaired Self Monitoring Impairment: Functional basic Self Correcting: Impaired Self Correcting Impairment: Functional basic Safety/Judgment: Impaired [employer] Scales of Cognitive Functioning: Automatic/appropriate Sensation Sensation Light Touch: Appears Intact Proprioception: Appears Intact Stereognosis: Appears Intact Coordination Gross Motor Movements are Fluid and Coordinated: No Fine Motor Movements are Fluid and Coordinated: Yes Motor  Motor Motor - Skilled Clinical Observations: generalized weakness  Mobility  Transfers Sit to Stand: Moderate Assistance - Patient 50-74% Stand to Sit: Moderate Assistance - Patient 50-74%  Trunk/Postural Assessment  Cervical Assessment Cervical Assessment: Within Functional Limits Thoracic Assessment Thoracic Assessment: Within Functional Limits Lumbar Assessment Lumbar Assessment: (posterior pelvic tilt) Postural Control Postural Control: Within Functional Limits(with Ue support - poor balance reactions)  Balance Balance Balance Assessed: Yes Dynamic Sitting Balance Dynamic Sitting - Balance Support: No upper extremity supported;During functional activity Dynamic Sitting - Level of Assistance: 5: Stand by assistance Static Standing Balance Static Standing - Balance Support: During functional activity Static Standing - Level of Assistance: 3: Mod assist Dynamic Standing Balance Dynamic Standing - Balance Support: During functional activity Dynamic Standing - Level of Assistance: 3: Mod assist;2: Max assist Extremity/Trunk Assessment RUE Assessment RUE Assessment: Within Functional Limits LUE Assessment LUE Assessment: Within Functional Limits     Refer to Care Plan for Long Term Goals  Recommendations for other services: Neuropsych and Therapeutic  Recreation  Outing/community reintegration   Discharge Criteria: Patient will be discharged from OT if patient refuses treatment 3 consecutive times without medical reason, if treatment goals not met, if there is a change in medical status, if patient makes no progress towards goals or if patient is discharged from hospital.  The above assessment, treatment plan, treatment alternatives and goals were discussed and mutually agreed upon: by patient  Nicoletta Ba 07/30/2018, 5:12 PM

## 2018-07-30 NOTE — Progress Notes (Signed)
Pt off unit at dialysis at time of scheduled OT session. Pt missed 30 minutes of tx time. Scheduling team made aware of dialysis schedule and scheduled accordingly to pt's dialysis schedule for the future. Will attempt to make up time as pt able. Antar Milks, OTR/L

## 2018-07-30 NOTE — Plan of Care (Signed)
  Problem: Consults Goal: RH STROKE PATIENT EDUCATION Description See Patient Education module for education specifics  Outcome: Progressing   Problem: RH BOWEL ELIMINATION Goal: RH STG MANAGE BOWEL WITH ASSISTANCE Description STG Manage Bowel with min Assistance.  Outcome: Progressing   Problem: RH SKIN INTEGRITY Goal: RH STG SKIN FREE OF INFECTION/BREAKDOWN Description Skin free of breakdown and infection with min assist  Outcome: Progressing Goal: RH STG MAINTAIN SKIN INTEGRITY WITH ASSISTANCE Description STG Maintain Skin Integrity With min Assistance.  Outcome: Progressing   Problem: RH SAFETY Goal: RH STG ADHERE TO SAFETY PRECAUTIONS W/ASSISTANCE/DEVICE Description STG Adhere to Safety Precautions With min Assistance/Device.  Outcome: Progressing   Problem: RH COGNITION-NURSING Goal: RH STG USES MEMORY AIDS/STRATEGIES W/ASSIST TO PROBLEM SOLVE Description STG Uses Memory Aids/Strategies With min Assistance to Problem Solve.  Outcome: Progressing Goal: RH STG ANTICIPATES NEEDS/CALLS FOR ASSIST W/ASSIST/CUES Description STG Anticipates Needs/Calls for Assist With min Assistance/Cues.  Outcome: Progressing   Problem: RH PAIN MANAGEMENT Goal: RH STG PAIN MANAGED AT OR BELOW PT'S PAIN GOAL Description Less than 3 out of 10  Outcome: Progressing   Problem: RH KNOWLEDGE DEFICIT Goal: RH STG INCREASE KNOWLEDGE OF DIABETES Outcome: Progressing

## 2018-07-30 NOTE — Progress Notes (Signed)
[facility] Progress Note   Dialysis Orders: Dialysis Orders:MWF [place] right AVF 4 hrs 180 NRe 400/Autoflow 1.5 71 kg 2.0K/2.0 Ca  R AVF -Heparin 1800 units IV -Mircera 150 mcg IV q 2 weeks (last dose 07/16/18 Last HGB 11.4 07/16/18)  Assessment/Plan: 1. 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   2. ESRD - MWF, HD today.  Well below EDW (71kg vs 63.9) , will try 2L UF today which he's been tolerating well.  Will need new EDW at discharge.  3. Anemia - hgb 12.4 stable  4. Secondary hyperparathyroidism - Corr ca ~11.6, 2 Ca dialysate today. PTH was 217 10/23, corr ca 11.4 at that time. Calcitriol was d/cd at that time as well.  Started sensipar 30 daily 11/5.  CTM  5. HTN/volume - Normotensive on non HD day, hypertensive prior to HD.  Challenging EDW with HD.  Currently no midodrine at present- trend for now. 6. Nutrition - albumin noted < 3.  Cont supplements and multivit 7. Disp - transferred to CIR 8. Dementia - baseline but  functional prior to acute event. 9. Multiple rib fx - stable 10. DM/hypothyroidism - meds per primary  Jannifer Hick MD [facility] Pager [phone]  Subjective:   Per notes more confused last PM - calling 911 from cell phone.  Today asks me when something is going to be done for his head bleed.  Has no recollection of hospital events.  Tol HD yesterday.  Objective Vitals:   07/29/18 1800  BP: 127/70  Pulse: 74  Resp: 20  Temp: 98.5 F (36.9 C)  TempSrc: Oral  SpO2: 100%  Weight: 68.2 kg   Physical Exam General: comfortable at 45 deg Heart: no rub Lungs: normal WOB, clear ant Abdomen: soft, nontender Extremities: no edema Dialysis Access: RUE AV access +thrill   Additional Objective Labs: Basic Metabolic Panel: Recent Labs  Lab 07/27/18 0340 07/28/18 0830 07/29/18 1836  NA 132* 130* 135  K 4.9 4.1 4.4  CL 95* 93* 95*  CO2 25 25 28   GLUCOSE 93  135* 119*  BUN 26* 38* 33*  CREATININE 5.44* 7.09* 5.55*  CALCIUM 10.0 10.1 10.6*  PHOS  --  5.0* 4.7*   Liver Function Tests: Recent Labs  Lab 07/28/18 0830 07/29/18 1836  ALBUMIN 2.4* 2.7*   No results for input(s): LIPASE, AMYLASE in the last 168 hours. CBC: Recent Labs  Lab 07/25/18 0623 07/27/18 0340 07/28/18 0830 07/29/18 1836  WBC 7.7 7.5 9.1 8.1  HGB 11.0* 11.6* 11.4* 12.4*  HCT 35.3* 36.5* 34.4* 40.0  MCV 99.7 98.1 96.1 98.8  PLT 167 158 190 188   Blood Culture    Component Value Date/Time   SDES URINE, CATHETERIZED 01/23/2018 2043   [place] NONE 01/23/2018 2043   CULT  01/23/2018 2043    NO GROWTH Performed at [facility] [address], [place], [place] [address]    REPTSTATUS 01/25/2018 FINAL 01/23/2018 2043    Cardiac Enzymes: No results for input(s): CKTOTAL, CKMB, CKMBINDEX, TROPONINI in the last 168 hours. CBG: Recent Labs  Lab 07/29/18 0734 07/29/18 1216 07/29/18 2130 07/30/18 0623 07/30/18 1213  GLUCAP 98 121* 172* 98 125*   Iron Studies: No results for input(s): IRON, TIBC, TRANSFERRIN, FERRITIN in the last 72 hours. Lab Results  Component Value Date   INR 0.97 07/22/2018   INR 1.01 06/05/2016   Studies/Results: Ct Head Wo Contrast  Result Date:  07/28/2018 CLINICAL DATA:  77 year old male with intracranial hemorrhage. Follow-up. Subsequent encounter. EXAM: CT HEAD WITHOUT CONTRAST TECHNIQUE: Contiguous axial images were obtained from the base of the skull through the vertex without intravenous contrast. COMPARISON:  07/24/2018. FINDINGS: Brain: Right-sided subdural drain has been removed. Slight decrease in amount of pneumocephalus. Broad-based complex left-sided loculated appearing subdural collection with maximal thickness left frontal region measuring up to 19 mm versus prior 22.8 mm. Left-sided subdural collection causes local mass effect which is most prominent left frontal region which is compressed posteriorly.  Mass-effect upon the left frontal horn left lateral ventricle with midline shift to the right by 4.7 mm similar to prior exam. Complex right-sided subdural hematoma with maximal thickness of 8.9 mm versus prior 13.8 mm. Local mass effect slightly improved. No new intracranial hemorrhage noted. No evidence of acute thrombotic infarct. No primary intracranial mass lesion noted on this unenhanced exam. Vascular: No hyperdense vessel. Skull: Burr-holes frontal region. Sinuses/Orbits: No acute orbital abnormality. Visualized paranasal sinuses are clear. Other: Mastoid air cells and middle ear cavities are clear. IMPRESSION: 1. Right subdural drain has been removed. Slight decrease in degree of pneumocephalus. 2. Broad-based complex left-sided loculated appearing subdural collection with maximal thickness left frontal region measuring up to 19 mm versus prior 22.8 mm. Left-sided subdural collection causes local mass effect which is most prominent left frontal region which is compressed posteriorly. Mass-effect upon the left frontal horn of the left lateral ventricle with midline shift to the right by 4.7 mm similar to prior exam. 3. Complex right-sided subdural hematoma with maximal thickness of 8.9 mm versus prior 13.8 mm. Local mass effect slightly improved. Electronically Signed   By: Genia Del M.D.   On: 07/28/2018 18:28   Medications: . sodium chloride    . sodium chloride     . Chlorhexidine Gluconate Cloth  6 each Topical Q0600  . cinacalcet  30 mg Oral Q supper  . docusate sodium  100 mg Oral BID  . donepezil  10 mg Oral QHS  . feeding supplement (NEPRO CARB STEADY)  237 mL Oral BID BM  . feeding supplement (PRO-STAT SUGAR FREE 64)  30 mL Oral BID  . insulin aspart  0-9 Units Subcutaneous TID WC  . levETIRAcetam  500 mg Oral BID  . levothyroxine  50 mcg Oral QAC breakfast  . memantine  10 mg Oral BID  . multivitamin  1 tablet Oral QHS  . NON FORMULARY 0.6 mg  0.6 mg Subcutaneous Q0600  .  pantoprazole  40 mg Oral Daily  . saccharomyces boulardii  250 mg Oral BID  . sevelamer carbonate  800 mg Oral Q supper  . simvastatin  20 mg Oral QHS  . vitamin B-12  1,000 mcg Oral Daily

## 2018-07-30 NOTE — Evaluation (Signed)
Speech Language Pathology Assessment and Plan  Patient Details  Name: [name] MRN: [id number] Date of Birth: 1941-07-06  SLP Diagnosis: Cognitive Impairments  Rehab Potential: Good ELOS: anticipated d/c date of 11/20    Today's Date: 07/30/2018 SLP Individual Time: 0955-1050 SLP Individual Time Calculation (min): 55 min   Problem List:  Patient Active Problem List   Diagnosis Date Noted  . Labile blood pressure   . Seizure prophylaxis   . Anemia of chronic disease   . TBI (traumatic brain injury) ([place]) 07/29/2018  . 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 Loose stools   . Dementia arising in the senium and presenium ([place])   . 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 Multiple fractures of ribs, left side, initial encounter for closed fracture 07/21/2018  . Subdural hematoma ([place]) 07/21/2018  . Angiodysplasia of stomach   . Occult GI bleeding   . GIB (gastrointestinal bleeding) 06/01/2018  . Low back pain 01/17/2018  . Anorexia   . Dyspnea   . Macrocytic anemia 06/04/2016  . Thrombocytopenia ([place]) 06/04/2016  . Exertional dyspnea 06/04/2016  . Arm paresthesia, left 06/04/2016  . Dyspnea on exertion 06/04/2016  . Tenderness of right calf 06/04/2016  . Mild cognitive impairment with memory loss 01/12/2016  . ESRD on dialysis ([place]) 05/17/2015  . Hypoglycemia   . Weakness 02/18/2015  . OSA (obstructive sleep apnea) 02/02/2015  . Hyperkalemia 10/05/2014  . Chronic kidney disease (CKD), stage IV (severe) ([place]) 04/07/2014  . End stage renal disease ([place]) 11/18/2013  . BPH (benign prostatic hyperplasia) 11/22/2012  . Peripheral vascular disease ([place]) 12/24/2011  . Hyperparathyroidism ([place]) 12/24/2011  . Hypothyroidism 12/24/2011  . Allergic rhinitis 12/24/2011  . Erectile dysfunction 12/24/2011  . Insulin dependent diabetes mellitus ([place]) 09/03/2008  . Hyperlipidemia 09/03/2008  . Essential hypertension 08/30/2008  . PANCREATITIS,  HX OF 08/30/2008  . RENAL FAILURE, ACUTE, HX OF 08/30/2008  . DIVERTICULOSIS, COLON 06/08/2003   Past Medical History:  Past Medical History:  Diagnosis Date  . Allergy   . Anemia   . Arthritis   . Cataract    bil cateracts removed  . Coronary artery disease   . Dementia arising in the senium and presenium ([place])   . Diabetes mellitus    Type 2  . Diverticulitis   . ED (erectile dysfunction)   . Elevated homocysteine ([place])   . ESRD (end stage renal disease) on dialysis ([place]) 03/2015  . GERD (gastroesophageal reflux disease)    pepto   . Gout   . Hyperlipidemia   . Hypertension   . Hypothyroidism   . Pneumonia   . PVD (peripheral vascular disease) ([place])    has plastic aorta  . Renal insufficiency   . Seasonal allergies   . Shortness of breath dyspnea   . Sleep apnea    does not wear c-pap  . Thyroid disease    Past Surgical History:  Past Surgical History:  Procedure Laterality Date  . aortobifemoral bypass    . AV FISTULA PLACEMENT Left 12/01/2013   Procedure: ARTERIOVENOUS (AV) FISTULA CREATION- LEFT BRACHIOCEPHALIC;  Surgeon: Angelia Mould, MD;  Location: [facility];  Service: Vascular;  Laterality: Left;  . [place] TRANSPOSITION Right 07/27/2014   Procedure: BASCILIC VEIN TRANSPOSITION;  Surgeon: Angelia Mould, MD;  Location: [facility];  Service: Vascular;  Laterality: Right;  . BREAST SURGERY     left - granulomatous mastitis  . BURR HOLE Bilateral 07/22/2018  Procedure: BILATERAL BURR HOLES;  Surgeon: Kristeen Miss, MD;  Location: [facility];  Service: Neurosurgery;  Laterality: Bilateral;  . COLONOSCOPY    . 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 ENTEROSCOPY N/A 06/03/2018   Procedure: ENTEROSCOPY;  Surgeon: Lavena Bullion, DO;  Location: [facility];  Service: Gastroenterology;  Laterality: N/A;  . EYE SURGERY Bilateral    cataracts  . HEMODIALYSIS INPATIENT  01/17/2018      . HOT HEMOSTASIS N/A 06/03/2018   Procedure: HOT  HEMOSTASIS (ARGON PLASMA COAGULATION/BICAP);  Surgeon: Lavena Bullion, DO;  Location: [facility] ENDOSCOPY;  Service: Gastroenterology;  Laterality: N/A;  . REVISON OF ARTERIOVENOUS FISTULA Left 02/09/2014   Procedure: REVISON OF LEFT ARTERIOVENOUS FISTULA - RESECTION OF RENDUNDANT VEIN;  Surgeon: Angelia Mould, MD;  Location: [facility];  Service: Vascular;  Laterality: Left;  . SBO with lysis adhesions    . SHUNTOGRAM Left 04/19/2014   Procedure: FISTULOGRAM;  Surgeon: Angelia Mould, MD;  Location: [facility] CATH LAB;  Service: Cardiovascular;  Laterality: Left;  . UNILATERAL UPPER EXTREMEITY ANGIOGRAM N/A 07/12/2014   Procedure: UNILATERAL UPPER Anselmo Rod;  Surgeon: Angelia Mould, MD;  Location: [facility] CATH LAB;  Service: Cardiovascular;  Laterality: N/A;    Assessment / Plan / Recommendation Clinical Impression   [name] is a42 year old male with history of ESRD-HD MWF, T2DM, recent GI bleed due to AVM, chronic hypertension, dementia, OSA; who was admitted to [facility] on 07/20/2018 with worsening of confusion, headaches and multiple falls. 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 He was evaluated by Dr. Ellene Route and underwent bilateral bur hole evacuation of subdural hemorrhage on 1029. Swallow function within normal limits and patient on regular textures. He has had worsening of cognitive status with confusion for the past couple of days with sleep-wake disruption.CT of head repeatedyesterdayrevealing improvement inrightsubdural hemorrhageand left residual SDH now withmass-effect left frontal region.Dr. Ellene Route recommends monitoring patient clinically with intermittent CT scan. Blood pressures trending high and midodrine on hold.Patient continues to have bouts of lethargy with slow shuffling gait and balance deficits. CIR recommended for follow-up therapy.  SLP evaluation was  completed on 07/30/2018 with the following results:  Pt presents with mild-moderate cognitive deficits characterized by decreased recall of information, decreased functional problem solving, decreased emergent awareness of his deficits, and decreased safety awareness.  Currently pt presents with behaviors consistent with RL VII and needs mod assist to complete tasks due to the abovementioned deficits.  No family was present to verify baseline level of functioning.  Pt would benefit from skilled ST while inpatient in order to maximize functional independence and reduce burden of care prior to discharge.  Anticipate that pt will need 24/7 supervision at discharge in addition to [place] follow up at next level of care.    Skilled Therapeutic Interventions          Cognitive-linguistic evaluation completed with results and recommendations reviewed with patient.     SLP Assessment  Patient will need skilled [place] Pathology Services during CIR admission    Recommendations  Patient destination: Home Follow up Recommendations: Home Health SLP;24 hour supervision/assistance Equipment Recommended: None recommended by SLP    SLP Frequency 3 to 5 out of 7 days   SLP Duration  SLP Intensity  SLP Treatment/Interventions anticipated d/c date of 11/20  Minumum of 1-2 x/day, 30 to 90 minutes  Cognitive remediation/compensation;Cueing hierarchy;Environmental  controls;Internal/external aids;Functional tasks;Patient/family education    Pain Pain Assessment Pain Scale: 0-10 Pain Score: 0-No pain  Prior Functioning Cognitive/Linguistic Baseline: Baseline deficits Baseline deficit details: has mild cognitive impairment with memory loss listed on pertinent medical history, pt also reports having trouble with memory at baseline Type of Home: House  Lives With: Spouse Available Help at Discharge: Family;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 hours/day Vocation: Retired  Industrial/product designer Term Goals: Week 1: SLP Short Term Goal 1 (Week  1): Pt will utilize external aids to recall daily information with min assist verbal cues.   SLP Short Term Goal 2 (Week 1): Pt will complete mildly complex tasks with min assist for functional problem solving.   SLP Short Term Goal 3 (Week 1): Pt will recognize and correct errors in the moment during functional tasks with min assist verbal cues.    Refer to Care Plan for Long Term Goals  Recommendations for other services: None   Discharge Criteria: Patient will be discharged from SLP if patient refuses treatment 3 consecutive times without medical reason, if treatment goals not met, if there is a change in medical status, if patient makes no progress towards goals or if patient is discharged from hospital.  The above assessment, treatment plan, treatment alternatives and goals were discussed and mutually agreed upon: by patient  Emilio Math 07/30/2018, 4:14 PM

## 2018-07-30 NOTE — Plan of Care (Signed)
Pt's plan of care adjusted to 15/7 after speaking with care team and discussed with MD in team conference as pt currently unable to tolerate current therapy schedule with OT, PT, and SLP. Luismanuel Corman, OTR/L

## 2018-07-30 NOTE — Progress Notes (Signed)
Physical Therapy Assessment and Plan  Patient Details  Name: [name] MRN: [id number] Date of Birth: [birth year]  PT Diagnosis: Abnormal posture, Abnormality of gait, Difficulty walking, Impaired cognition, Impaired sensation, Muscle weakness and Pain in L hip Rehab Potential: Good ELOS: 12-14 days   Today's Date: 07/30/2018 PT Individual Time:  - 1110-1200    PT Individual Minutes: 50 min  Problem List:  Patient Active Problem List   Diagnosis Date Noted  . Labile blood pressure   . Seizure prophylaxis   . Anemia of chronic disease   . TBI (traumatic brain injury) ([place]) 07/29/2018  . 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 Loose stools   . Dementia arising in the senium and presenium ([place])   . 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 Multiple fractures of ribs, left side, initial encounter for closed fracture 07/21/2018  . Subdural hematoma ([place]) 07/21/2018  . Angiodysplasia of stomach   . Occult GI bleeding   . GIB (gastrointestinal bleeding) 06/01/2018  . Low back pain 01/17/2018  . Anorexia   . Dyspnea   . Macrocytic anemia 06/04/2016  . Thrombocytopenia ([place]) 06/04/2016  . Exertional dyspnea 06/04/2016  . Arm paresthesia, left 06/04/2016  . Dyspnea on exertion 06/04/2016  . Tenderness of right calf 06/04/2016  . Mild cognitive impairment with memory loss 01/12/2016  . ESRD on dialysis ([place]) 05/17/2015  . Hypoglycemia   . Weakness 02/18/2015  . OSA (obstructive sleep apnea) 02/02/2015  . Hyperkalemia 10/05/2014  . Chronic kidney disease (CKD), stage IV (severe) ([place]) 04/07/2014  . End stage renal disease ([place]) 11/18/2013  . BPH (benign prostatic hyperplasia) 11/22/2012  . Peripheral vascular disease ([place]) 12/24/2011  . Hyperparathyroidism ([place]) 12/24/2011  . Hypothyroidism 12/24/2011  . Allergic rhinitis 12/24/2011  . Erectile dysfunction 12/24/2011  . Insulin dependent diabetes mellitus ([place]) 09/03/2008  . Hyperlipidemia  09/03/2008  . Essential hypertension 08/30/2008  . PANCREATITIS, HX OF 08/30/2008  . RENAL FAILURE, ACUTE, HX OF 08/30/2008  . DIVERTICULOSIS, COLON 06/08/2003    Past Medical History:  Past Medical History:  Diagnosis Date  . Allergy   . Anemia   . Arthritis   . Cataract    bil cateracts removed  . Coronary artery disease   . Dementia arising in the senium and presenium ([place])   . Diabetes mellitus    Type 2  . Diverticulitis   . ED (erectile dysfunction)   . Elevated homocysteine ([place])   . ESRD (end stage renal disease) on dialysis ([place]) 03/2015  . GERD (gastroesophageal reflux disease)    pepto   . Gout   . Hyperlipidemia   . Hypertension   . Hypothyroidism   . Pneumonia   . PVD (peripheral vascular disease) ([place])    has plastic aorta  . Renal insufficiency   . Seasonal allergies   . Shortness of breath dyspnea   . Sleep apnea    does not wear c-pap  . Thyroid disease    Past Surgical History:  Past Surgical History:  Procedure Laterality Date  . aortobifemoral bypass    . AV FISTULA PLACEMENT Left 12/01/2013   Procedure: ARTERIOVENOUS (AV) FISTULA CREATION- LEFT BRACHIOCEPHALIC;  Surgeon: Angelia Mould, MD;  Location: [facility];  Service: Vascular;  Laterality: Left;  . [place] TRANSPOSITION Right 07/27/2014   Procedure: BASCILIC VEIN TRANSPOSITION;  Surgeon: Angelia Mould, MD;  Location: [facility];  Service: Vascular;  Laterality: Right;  . BREAST SURGERY  left - granulomatous mastitis  . BURR HOLE Bilateral 07/22/2018   Procedure: BILATERAL BURR HOLES;  Surgeon: Kristeen Miss, MD;  Location: [facility];  Service: Neurosurgery;  Laterality: Bilateral;  . COLONOSCOPY    . 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 ENTEROSCOPY N/A 06/03/2018   Procedure: ENTEROSCOPY;  Surgeon: Lavena Bullion, DO;  Location: [facility];  Service: Gastroenterology;  Laterality: N/A;  . EYE SURGERY Bilateral    cataracts  . HEMODIALYSIS INPATIENT   01/17/2018      . HOT HEMOSTASIS N/A 06/03/2018   Procedure: HOT HEMOSTASIS (ARGON PLASMA COAGULATION/BICAP);  Surgeon: Lavena Bullion, DO;  [facility] ENDOSCOPY;  Service: Gastroenterology;  Laterality: N/A;  . REVISON OF ARTERIOVENOUS FISTULA Left 02/09/2014   Procedure: REVISON OF LEFT ARTERIOVENOUS FISTULA - RESECTION OF RENDUNDANT VEIN;  Surgeon: Angelia Mould, MD;  Location: [facility];  Service: Vascular;  Laterality: Left;  . SBO with lysis adhesions    . SHUNTOGRAM Left 04/19/2014   Procedure: FISTULOGRAM;  Surgeon: Angelia Mould, MD;  Location: [facility] CATH LAB;  Service: Cardiovascular;  Laterality: Left;  . UNILATERAL UPPER EXTREMEITY ANGIOGRAM N/A 07/12/2014   Procedure: UNILATERAL UPPER Anselmo Rod;  Surgeon: Angelia Mould, MD;  Location: [facility] CATH LAB;  Service: Cardiovascular;  Laterality: N/A;    Assessment & Plan Clinical Impression: AMARIYON [name] is a89 year old male with history of ESRD-HD MWF, T2DM, recent GI bleed due to AVM, chronic hypertension, dementia, OSA; who was admitted to [facility] on 07/20/2018 with worsening of confusion, headaches and multiple falls. 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 He was evaluated by Dr. Ellene Route and underwent bilateral bur hole evacuation of subdural hemorrhage on 1029. Swallow function within normal limits and patient on regular textures. He has had worsening of cognitive status with confusion for the past couple of days with sleep-wake disruption.CT of head repeatedyesterdayrevealing improvement inrightsubdural hemorrhageand left residual SDH now withmass-effect left frontal region.Dr. Ellene Route recommends monitoring patient clinically with intermittent CT scan. Blood pressures trending high and midodrine on hold.Patient continues to have bouts of lethargy with slow shuffling gait and balance deficits. CIR recommended  for follow-up therapy. Patient transferred to CIR on 07/29/2018 .   Patient currently requires mod with mobility secondary to muscle weakness, decreased cardiorespiratoy endurance and decreased standing balance, decreased postural control and decreased balance strategies.  Prior to hospitalization, patient was modified independent  with mobility and lived with Spouse in a House home.  Home access is 8Stairs to enter, 2 rails can reach both.   Patient will benefit from skilled PT intervention to maximize safe functional mobility, minimize fall risk and decrease caregiver burden for planned discharge home with 24 hour supervision.  Anticipate patient will benefit from follow up [place] at discharge.  PT - End of Session Activity Tolerance: Tolerates 10 - 20 min activity with multiple rests Endurance Deficit: Yes Endurance Deficit Description: fatigues quickly with low level mobility activities PT Assessment Rehab Potential (ACUTE/IP ONLY): Good PT Barriers to Discharge: Home environment access/layout PT Barriers to Discharge Comments: 8 STE PT Patient demonstrates impairments in the following area(s): Balance;Endurance;Motor;Safety PT Transfers Functional Problem(s): Bed Mobility;Bed to Chair;Car;Furniture PT Locomotion Functional Problem(s): Ambulation;Stairs;Wheelchair Mobility PT Plan PT Intensity: Minimum of 1-2 x/day ,45 to 90 minutes PT Frequency: 5 out of 7 days PT Duration Estimated Length of Stay: 12-14 days PT Treatment/Interventions: Cognitive remediation/compensation;Ambulation/gait training;Balance/vestibular training;Community reintegration;Discharge  planning;Disease management/prevention;Functional mobility training;DME/adaptive equipment instruction;Pain management;Wheelchair propulsion/positioning;UE/LE Coordination activities;Therapeutic Exercise;Stair training;Patient/family education;Neuromuscular re-education;Splinting/orthotics;Therapeutic Activities;UE/LE Strength taining/ROM PT  Transfers Anticipated Outcome(s): S PT Locomotion Anticipated Outcome(s): S with LRAD PT Recommendation Recommendations for Other Services: Therapeutic Recreation consult Therapeutic Recreation Interventions: Outing/community reintergration Follow Up Recommendations: Home health PT Patient destination: Home Equipment Recommended: To be determined  Skilled Therapeutic Intervention Pt received seated in w/c, denies pain at rest but reports pain in L hip with mobility and strength testing. PT eval as below with min/modA with RW d/t LE strength and poor balance reactions. Educated pt in rehab process, goals, length of stay to be determined once all evaluations completed. Reiterated purpose of safety lap belt alarm to remind pt to call for staff if he needs to get up. Performed LLE PROM hip flexion, IR/ER, gentle distraction to assist with pain modulation. Remained in w/c at end of session, all needs in reach.   PT Evaluation Precautions/Restrictions Precautions Precautions: Fall Restrictions Weight Bearing Restrictions: No General Chart Reviewed: Yes Response to Previous Treatment: Patient reporting fatigue but able to participate. 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 Praxis: Intact  Cognition Overall Cognitive Status: Impaired/Different from baseline Arousal/Alertness: Awake/alert Orientation Level: Oriented to person;Oriented to  place;Oriented to situation Attention: Sustained Focused Attention: Appears intact Sustained Attention: Appears intact Selective Attention: Appears intact Memory: Impaired Memory Impairment: Storage deficit;Retrieval deficit Awareness: Impaired Awareness Impairment: Emergent impairment Problem Solving: Impaired Safety/Judgment: Impaired Sensation Sensation Light Touch: Appears Intact Proprioception: Appears Intact Coordination Gross Motor Movements are Fluid and Coordinated: No Heel Shin Test: slow speed, decreased excursion and limited by pain L hip Motor  Motor Motor - Skilled Clinical Observations: generalized weakness  Mobility Bed Mobility Bed Mobility: Supine to Sit;Sit to Supine Supine to Sit: Minimal Assistance - Patient > 75% Sit to Supine: Supervision/Verbal cueing Transfers Transfers: Sit to Stand;Stand to Sit;Stand Pivot Transfers Sit to Stand: Moderate Assistance - Patient 50-74% Stand to Sit: Minimal Assistance - Patient > 75% Stand Pivot Transfers: Moderate Assistance - Patient 50 - 74% Stand Pivot Transfer Details: Verbal cues for sequencing;Verbal cues for precautions/safety;Verbal cues for technique;Verbal cues for safe use of DME/AE Transfer (Assistive device): Rolling walker Locomotion  Gait Ambulation: Yes Gait Assistance: Minimal Assistance - Patient > 75% Gait Distance (Feet): 75 Feet Assistive device: Rolling walker Gait Assistance Details: Verbal cues for precautions/safety;Verbal cues for technique Gait Gait: Yes Gait Pattern: Impaired Gait Pattern: Festinating;Narrow base of support;Poor foot clearance - left;Poor foot clearance - right;Shuffle;Right foot flat;Right flexed knee in stance;Left flexed knee in stance;Left foot flat;Decreased stride length;Step-through pattern Gait velocity: decreased  Stairs / Additional Locomotion Stairs: Yes Stairs Assistance: Minimal Assistance - Patient > 75% Stair Management Technique: Two  rails;Forwards;Step to pattern Number of Stairs: 4 Height of Stairs: 6 Wheelchair Mobility Wheelchair Mobility: No  Trunk/Postural Assessment  Cervical Assessment Cervical Assessment: Within Functional Limits Thoracic Assessment Thoracic Assessment: Within Functional Limits Lumbar Assessment Lumbar Assessment: Exceptions to WFL(posterior pelvic tilt) Postural Control Postural Control: Deficits on evaluation(delayed righting and stepping reactions)  Balance Balance Balance Assessed: Yes Dynamic Sitting  Balance Dynamic Sitting - Balance Support: No upper extremity supported;During functional activity Dynamic Sitting - Level of Assistance: 5: Stand by assistance Dynamic Sitting - Balance Activities: Lateral lean/weight shifting;Forward lean/weight shifting;Reaching across midline Static Standing Balance Static Standing - Balance Support: During functional activity;Bilateral upper extremity supported Static Standing - Level of Assistance: 5: Stand by assistance Dynamic Standing Balance Dynamic Standing - Balance Support: During functional activity;Bilateral upper extremity supported Dynamic Standing - Level of Assistance: 4: Min assist Dynamic Standing - Balance Activities: Lateral lean/weight shifting;Forward lean/weight shifting;Reaching across midline Extremity Assessment  RUE Assessment RUE Assessment: Within Functional Limits LUE Assessment LUE Assessment: Within Functional Limits RLE Assessment RLE Assessment: Exceptions to [facility] General Strength Comments: grossly 4/5 throughout LLE Assessment LLE Assessment: Exceptions to [facility] General Strength Comments: grossly 4/5 throughout with exception of hip flexion 4-/5 limited by pain    Refer to Care Plan for Long Term Goals  Recommendations for other services: None   Discharge Criteria: Patient will be discharged from PT if patient refuses treatment 3 consecutive times without medical reason, if treatment goals not met, if  there is a change in medical status, if patient makes no progress towards goals or if patient is discharged from hospital.  The above assessment, treatment plan, treatment alternatives and goals were discussed and mutually agreed upon: by patient and by family  Corliss Skains 07/30/2018, 11:05 AM

## 2018-07-30 NOTE — Progress Notes (Signed)
Social Work  Social Work Assessment and Plan  Patient Details  Name: [name] MRN: [id number] Date of Birth: May 30, 1941  Today's Date: 07/30/2018  Problem List:  Patient Active Problem List   Diagnosis Date Noted  . Labile blood pressure   . Seizure prophylaxis   . Anemia of chronic disease   . TBI (traumatic brain injury) ([place]) 07/29/2018  . 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 Loose stools   . Dementia arising in the senium and presenium ([place])   . 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 Multiple fractures of ribs, left side, initial encounter for closed fracture 07/21/2018  . Subdural hematoma ([place]) 07/21/2018  . Angiodysplasia of stomach   . Occult GI bleeding   . GIB (gastrointestinal bleeding) 06/01/2018  . Low back pain 01/17/2018  . Anorexia   . Dyspnea   . Macrocytic anemia 06/04/2016  . Thrombocytopenia ([place]) 06/04/2016  . Exertional dyspnea 06/04/2016  . Arm paresthesia, left 06/04/2016  . Dyspnea on exertion 06/04/2016  . Tenderness of right calf 06/04/2016  . Mild cognitive impairment with memory loss 01/12/2016  . ESRD on dialysis ([place]) 05/17/2015  . Hypoglycemia   . Weakness 02/18/2015  . OSA (obstructive sleep apnea) 02/02/2015  . Hyperkalemia 10/05/2014  . Chronic kidney disease (CKD), stage IV (severe) ([place]) 04/07/2014  . End stage renal disease ([place]) 11/18/2013  . BPH (benign prostatic hyperplasia) 11/22/2012  . Peripheral vascular disease ([place]) 12/24/2011  . Hyperparathyroidism ([place]) 12/24/2011  . Hypothyroidism 12/24/2011  . Allergic rhinitis 12/24/2011  . Erectile dysfunction 12/24/2011  . Insulin dependent diabetes mellitus ([place]) 09/03/2008  . Hyperlipidemia 09/03/2008  . Essential hypertension 08/30/2008  . PANCREATITIS, HX OF 08/30/2008  . RENAL FAILURE, ACUTE, HX OF 08/30/2008  . DIVERTICULOSIS, COLON 06/08/2003   Past Medical History:  Past Medical History:  Diagnosis Date  . Allergy   .  Anemia   . Arthritis   . Cataract    bil cateracts removed  . Coronary artery disease   . Dementia arising in the senium and presenium ([place])   . Diabetes mellitus    Type 2  . Diverticulitis   . ED (erectile dysfunction)   . Elevated homocysteine ([place])   . ESRD (end stage renal disease) on dialysis ([place]) 03/2015  . GERD (gastroesophageal reflux disease)    pepto   . Gout   . Hyperlipidemia   . Hypertension   . Hypothyroidism   . Pneumonia   . PVD (peripheral vascular disease) ([place])    has plastic aorta  . Renal insufficiency   . Seasonal allergies   . Shortness of breath dyspnea   . Sleep apnea    does not wear c-pap  . Thyroid disease    Past Surgical History:  Past Surgical History:  Procedure Laterality Date  . aortobifemoral bypass    . AV FISTULA PLACEMENT Left 12/01/2013   Procedure: ARTERIOVENOUS (AV) FISTULA CREATION- LEFT BRACHIOCEPHALIC;  Surgeon: Angelia Mould, MD;  Location: [place];  Service: Vascular;  Laterality: Left;  . [place] TRANSPOSITION Right 07/27/2014   Procedure: BASCILIC VEIN TRANSPOSITION;  Surgeon: Angelia Mould, MD;  Location: [facility];  Service: Vascular;  Laterality: Right;  . BREAST SURGERY     left - granulomatous mastitis  . BURR HOLE Bilateral 07/22/2018   Procedure: BILATERAL BURR HOLES;  Surgeon: Kristeen Miss, MD;  Location: [facility];  Service: Neurosurgery;  Laterality: Bilateral;  . COLONOSCOPY    .  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 ENTEROSCOPY N/A 06/03/2018   Procedure: ENTEROSCOPY;  Surgeon: Lavena Bullion, DO;  Location: [facility];  Service: Gastroenterology;  Laterality: N/A;  . EYE SURGERY Bilateral    cataracts  . HEMODIALYSIS INPATIENT  01/17/2018      . HOT HEMOSTASIS N/A 06/03/2018   Procedure: HOT HEMOSTASIS (ARGON PLASMA COAGULATION/BICAP);  Surgeon: Lavena Bullion, DO;  Location: [name] ENDOSCOPY;  Service: Gastroenterology;  Laterality: N/A;  . REVISON OF ARTERIOVENOUS FISTULA  Left 02/09/2014   Procedure: REVISON OF LEFT ARTERIOVENOUS FISTULA - RESECTION OF RENDUNDANT VEIN;  Surgeon: Angelia Mould, MD;  Location: [facility];  Service: Vascular;  Laterality: Left;  . SBO with lysis adhesions    . SHUNTOGRAM Left 04/19/2014   Procedure: FISTULOGRAM;  Surgeon: Angelia Mould, MD;  Location: [facility] CATH LAB;  Service: Cardiovascular;  Laterality: Left;  . UNILATERAL UPPER EXTREMEITY ANGIOGRAM N/A 07/12/2014   Procedure: UNILATERAL UPPER Anselmo Rod;  Surgeon: Angelia Mould, MD;  Location: [facility] CATH LAB;  Service: Cardiovascular;  Laterality: N/A;   Social History:  reports that he quit smoking about 23 years ago. He has never used smokeless tobacco. He reports that he does not drink alcohol or use drugs.  Family / Support Systems Marital Status: Married Patient Roles: Spouse, Parent Spouse/Significant Other: [name] [phone] Children: Crystal-daughter [phone] Other Supports: Tammy Summers-niece [phone] Anticipated Caregiver: Wife is in good health, daughter lives in [place] but can come on the weekends to assist Ability/Limitations of Caregiver: Supervision-light min assist Caregiver Availability: 24/7 Family Dynamics: Close knit family pt voiced his daughter is very supportive and involved and is here often. They have friends and church members who are supportive and visit him.  Social History Preferred language: English Religion: Baptist Cultural Background: No issues Education: [employer] Read: Yes Write: Yes Employment Status: Retired Freight forwarder Issues: No issues Guardian/Conservator: None-according to MD pt is not fully capable of making his own decisions at this time. Will look toward his wife to make any decisions while here   Abuse/Neglect Abuse/Neglect Assessment Can Be Completed: Yes Physical Abuse: Denies Verbal Abuse: Denies Sexual Abuse: Denies Exploitation of  patient/patient's resources: Denies Self-Neglect: Denies  Emotional Status Pt's affect, behavior adn adjustment status: Pt is motivated to do well and wants to get home soon. He is tired from all of this therapies and needs a rest. He feels he is doing fairly well but does need to get stronger and move better. He doesn't want to burden his wife. Recent Psychosocial Issues: other health issues-falling at home prior to admission ESRD-M,W,F per Lucianne Lei Pyschiatric History: Dementia but wife reports forgetful but does well. Pt seems to be a simple man who enjoys life.  Substance Abuse History: No issues  Patient / Family Perceptions, Expectations & Goals Pt/Family understanding of illness & functional limitations: Pt and wife can explain his surgery and deficits. Wife does talk with the MD and feels she has a good understanding of the plan and treatment moving forward. Encoruaged her to see pt in therapies and let us know how she feels he is doing. Premorbid pt/family roles/activities: Husband, father, dialysis pt, church member, retiree, Social research officer, government Anticipated changes in roles/activities/participation: resume Pt/family expectations/goals: Pt states: " I would like to go home soon, if I can." Wife states: " I hope he is more mobile and safer moving around before he comes home."  [employer]: Other (Comment)(ESRD-M,W,F) Premorbid Home Care/DME  Agencies: Other (Comment)(has rw and wc) Transportation available at discharge: Wife drives and pt take Lucianne Lei to HD Resource referrals recommended: Support group (specify)  Discharge Planning Living Arrangements: Spouse/significant other Support Systems: Spouse/significant other, Children, Friends/neighbors, Church/faith community Type of Residence: Private residence Insurance Resources: [employer], Multimedia programmer (specify)(Tricare) Financial Resources: Radio broadcast assistant Screen Referred: No Living Expenses: Own Money Management:  Spouse Does the patient have any problems obtaining your medications?: No Home Management: Wife Patient/Family Preliminary Plans: Return home with wife who can assist if needed, but light min assist and supervision level. Pt needs to get stronger and mvoing better before he goes home. He was falling a lot at home prior to admission. Will await therapy evaluations and work on safe discharge plan. Social Work Anticipated Follow Up Needs: HH/OP, Support Group  Clinical Impression Pleasant gentleman who is motivated to do well but would like to be home soon. He is working hard in therapies. Wife is involved and will assist, but limited also. Their daughter comes in the weekends to see pt and make sure doing ok. Will await therapy team evaluations and work on a safe plan for him.  Elease Hashimoto 07/30/2018, 12:29 PM

## 2018-07-30 NOTE — Progress Notes (Signed)
SLP Cancellation Note  Patient Details Name: [name] MRN: [id number] DOB: 1941-04-23   Cancelled treatment:       Reason Eval/Treat Not Completed: Patient at procedure or test/unavailable   Kadin Canipe, Katherene Ponto 07/30/2018, 2:57 PM

## 2018-07-30 NOTE — Care Management Note (Signed)
[facility] Individual Statement of Services  Patient Name:  [name]  Date:  07/30/2018  Welcome to the [facility].  Our goal is to provide you with an individualized program based on your diagnosis and situation, designed to meet your specific needs.  With this comprehensive rehabilitation program, you will be expected to participate in at least 3 hours of rehabilitation therapies Monday-Friday, with modified therapy programming on the weekends.  Your rehabilitation program will include the following services:  Physical Therapy (PT), Occupational Therapy (OT), Speech Therapy (ST), 24 hour per day rehabilitation nursing, Case Management (Social Worker), Rehabilitation Medicine, Nutrition Services and Pharmacy Services  Weekly team conferences will be held on Wdnesday to discuss your progress.  Your Social Worker will talk with you frequently to get your input and to update you on team discussions.  Team conferences with you and your family in attendance may also be held.  Expected length of stay: 14 days Overall anticipated outcome: supervision level  Depending on your progress and recovery, your program may change. Your Social Worker will coordinate services and will keep you informed of any changes. Your Social Worker's name and contact numbers are listed  below.  The following services may also be recommended but are not provided by the [facility]:    [place] will be made to provide these services after discharge if needed.  Arrangements include referral to agencies that provide these services.  Your insurance has been verified to be:  Medicare & Tricare Your primary doctor is:  Tedra Senegal  Pertinent information will be shared with your doctor and your insurance company.  Social Worker:  Ovidio Kin, [place] or ([phone]  Information discussed with and copy given to patient by: Elease Hashimoto, 07/30/2018, 12:32 PM

## 2018-07-30 NOTE — Progress Notes (Addendum)
[facility] PHYSICAL MEDICINE & REHABILITATION PROGRESS NOTE  Subjective/Complaints: Pt seen laying in bed this AM. No reported issues overnight.  ROS: Unable to assess due to cognition  Objective: Vital Signs: Blood pressure 127/70, pulse 74, temperature 98.5 F (36.9 C), temperature source Oral, resp. rate 20, weight 68.2 kg, SpO2 100 %. Ct Head Wo Contrast  Result Date: 07/28/2018 CLINICAL DATA:  77 year old male with intracranial hemorrhage. Follow-up. Subsequent encounter. EXAM: CT HEAD WITHOUT CONTRAST TECHNIQUE: Contiguous axial images were obtained from the base of the skull through the vertex without intravenous contrast. COMPARISON:  07/24/2018. FINDINGS: Brain: Right-sided subdural drain has been removed. Slight decrease in amount of pneumocephalus. Broad-based complex left-sided loculated appearing subdural collection with maximal thickness left frontal region measuring up to 19 mm versus prior 22.8 mm. Left-sided subdural collection causes local mass effect which is most prominent left frontal region which is compressed posteriorly. Mass-effect upon the left frontal horn left lateral ventricle with midline shift to the right by 4.7 mm similar to prior exam. Complex right-sided subdural hematoma with maximal thickness of 8.9 mm versus prior 13.8 mm. Local mass effect slightly improved. No new intracranial hemorrhage noted. No evidence of acute thrombotic infarct. No primary intracranial mass lesion noted on this unenhanced exam. Vascular: No hyperdense vessel. Skull: Burr-holes frontal region. Sinuses/Orbits: No acute orbital abnormality. Visualized paranasal sinuses are clear. Other: Mastoid air cells and middle ear cavities are clear. IMPRESSION: 1. Right subdural drain has been removed. Slight decrease in degree of pneumocephalus. 2. Broad-based complex left-sided loculated appearing subdural collection with maximal thickness left frontal region measuring up to 19 mm versus prior 22.8 mm.  Left-sided subdural collection causes local mass effect which is most prominent left frontal region which is compressed posteriorly. Mass-effect upon the left frontal horn of the left lateral ventricle with midline shift to the right by 4.7 mm similar to prior exam. 3. Complex right-sided subdural hematoma with maximal thickness of 8.9 mm versus prior 13.8 mm. Local mass effect slightly improved. Electronically Signed   By: Genia Del M.D.   On: 07/28/2018 18:28   Recent Labs    07/28/18 0830 07/29/18 1836  WBC 9.1 8.1  HGB 11.4* 12.4*  HCT 34.4* 40.0  PLT 190 188   Recent Labs    07/28/18 0830 07/29/18 1836  NA 130* 135  K 4.1 4.4  CL 93* 95*  CO2 25 28  GLUCOSE 135* 119*  BUN 38* 33*  CREATININE 7.09* 5.55*  CALCIUM 10.1 10.6*    Physical Exam: BP 127/70 (BP Location: Right Leg)   Pulse 74   Temp 98.5 F (36.9 C) (Oral)   Resp 20   Wt 68.2 kg   SpO2 100%   BMI 20.97 kg/m  Constitutional: Well-developed. NAD HENT:Bilateral crani incisions with staples. Eyes:EOMI. No discharge.   Cardiovascular:Normal rateand regular rhythm. No JVD. Respiratory:Effort normal. Mild rales.  GI: He exhibitsno distension. Bowel sounds normal.  Musculoskeletal: He exhibits noedema and tenderness. Neurological: Alert and Oriented x1.  Right gaze preference with left inattention and ? visual field deficits.  Able to follow simple one step motor commands.  Motor: B/l UE 4/5 proximal to distal B/l LE: HF 3/5, KE 3+/5, ADF 4/5  HOH Skin: Warm and dry. Intact.  Assessment/Plan: 1. 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  Physiatrist is providing close team supervision and 24 hour management of active medical problems listed below.  Physiatrist  and rehab team continue to assess barriers to discharge/monitor patient progress toward functional and medical goals  Care  Tool:  Bathing              Bathing assist       Upper Body Dressing/Undressing Upper body dressing   What is the patient wearing?: Hospital gown only    Upper body assist Assist Level: Minimal Assistance - Patient > 75%    Lower Body Dressing/Undressing Lower body dressing      What is the patient wearing?: Hospital gown only     Lower body assist Assist for lower body dressing: Minimal Assistance - Patient > 75%     Toileting Toileting    Toileting assist       Transfers Chair/bed transfer  Transfers assist           Locomotion Ambulation   Ambulation assist              Walk 10 feet activity   Assist           Walk 50 feet activity   Assist           Walk 150 feet activity   Assist           Walk 10 feet on uneven surface  activity   Assist           Wheelchair     Assist               Wheelchair 50 feet with 2 turns activity    Assist            Wheelchair 150 feet activity     Assist            Medical Problem List and Plan: 1.Functional and cognitive deficitssecondary to bi-frontal-temporal SDH's.   Begin CIR  Notes reviewed- multiple falls, images reviewed, head CT with bilateral SDH, labs reviewed  Repeat head CT in a couple of days if no improvement. 2. DVT Prophylaxis/Anticoagulation: Mechanical:Sequential compression devices, below kneeBilateral lower extremities 3. Pain Management:tylenol pron 4. Mood:LCSW to follow for evaluation and support. 5. Neuropsych: This patientis notcapable of making decisions on hisown behalf. 6. Skin/Wound Care:routine pressure relief measures. 7. Fluids/Electrolytes/Nutrition:Monitor I/Os. Labs with HD 8. ESRD: HD MWF at end of the day to help with tolerance of therapy.  9. CAD: Stable  10 T2DM: Monitor BS ac/hs and use SSI for elevated BS.Was on Victoza at home due to multiple hypoglycemic episodes with long acting  insulin.  Monitor with increased mobility 11. Seizure prophylaxis: to continue Keppra bid for 2 more days. 12. H/O IBS: Discontinued laxatives due to reports of incontinence due to diarrhea:  13. Dementia: On Nameda and Aricept. 14. Loose stools. Wife reports history of cycling loose and then constipated stools PTA.   Medications on hold currently Added probiotic (?IBS) 15.  Labile blood pressure  Labile on 11/6 16.  Anemia of chronic disease  Hemoglobin 12.411/5  Continue to monitor   LOS: 1 days A FACE TO FACE EVALUATION WAS PERFORMED  Evo Aderman [name] 07/30/2018, 8:31 AM

## 2018-07-30 NOTE — Progress Notes (Signed)
Initial Nutrition Assessment  DOCUMENTATION CODES:   Severe malnutrition in context of chronic illness  INTERVENTION:   - Continue Nepro Shake po BID, each supplement provides 425 kcal and 19 grams protein  - Continue Pro-stat 30 ml BID, each supplement provides 100 kcal and 15 grams protein  - Continue renal MVI daily  NUTRITION DIAGNOSIS:   Severe Malnutrition related to chronic illness (ESRD on HD) as evidenced by moderate fat depletion, severe muscle depletion, percent weight loss (25.8% weight loss in 1 year).  GOAL:   Patient will meet greater than or equal to 90% of their needs  MONITOR:   PO intake, Supplement acceptance, I & O's, Weight trends, Labs, Skin  REASON FOR ASSESSMENT:   Malnutrition Screening Tool    ASSESSMENT:   77 year old male with PMH significant for ESRD on HD, type 2 diabetes mellitus, recent GI bleed due to AVM, hypertension, dementia, and OSA. Pt was [facility] to [facility] 10/27 with worsening confusion, headaches, and multiple falls and was found to have acute on chronic large bilateral subdural hemorrhages as well as multiple left rib fractures. Pt underwent bilateral bur hole evacuation of subdural hemorrhage on 10/29. Pt has had worsening cognitive status with confusion for the past couple of days with sleep-wake disruption.Repeat CT showed improvement inrightsubdural hemorrhageand left residual SDH now withmass-effect left frontal region.CIR recommended for follow-up therapy.  11/4 HD Net UF: 2000 ml 11/4 post-HD weight: 63.9 kg  Spoke with pt and niece at bedside. Pt preparing for HD today.  Pt states that he finally has an appetite and wants to eat after 1 year of not having any appetite at all. Pt's niece states, "he had NO appetite at all." Pt shares that he would eat a couple of bites at meals but then would stop eating due to early satiety.  Pt states that he was hungry for breakfast this AM and had half of the sausage and most of  the [place] toast. Meal completion for this morning recorded as 40%. Pt drinking Nepro at time of visit and is willing to continue consuming these supplements to aid in protein and kcal intake.  Pt's niece states that pt's wife hopes that pt will not have to use insulin in the hospital and that he had gotten to the point where he did not need it PTA. Pt's niece reports that insulin makes pt's sugar "swing" and that he is a "brittle diabetic." Pt's niece with questions regarding diabetes. All questions answered.  Pt denies any issues chewing but states that he occasionally has issues swallowing, especially with tough meats.  Pt reports weight loss over the past 1 year due to "not eating." Pt reports his UBW as 180 lbs. Per weight history in chart, pt with 52 lb weight loss over the past 1 year. This is a 25.8% weight loss which is significant for timeframe. Pt states that he has recently started gaining weight back and is pleased about this.   Medications reviewed and include: Colace 100 mg BID, Nepro BID, Pro-stat 30 ml BID, sliding scale Novolog, Rena-vit daily, Protonix 40 mg daily, Florastor daily, vitamin B-12 1000 mcg daily, Renvela 800 mg daily  Labs reviewed: phosphorus 4.7 (H) CBG's: 98, 172, 121 x 24 hours  NUTRITION - FOCUSED PHYSICAL EXAM:    Most Recent Value  Orbital Region  Moderate depletion  Upper Arm Region  Moderate depletion  Thoracic and Lumbar Region  Moderate depletion  Buccal Region  Moderate depletion  Temple Region  Severe  depletion  Clavicle Bone Region  Severe depletion  Clavicle and Acromion Bone Region  Severe depletion  Scapular Bone Region  Moderate depletion  Dorsal Hand  Mild depletion  Patellar Region  Moderate depletion  Anterior Thigh Region  Moderate depletion  Posterior Calf Region  Moderate depletion  Edema (RD Assessment)  None  Hair  Reviewed  Eyes  Reviewed  Mouth  Reviewed  Skin  Reviewed  Nails  Reviewed       Diet Order:   Diet Order             Diet renal/carb modified with fluid restriction Diet-HS Snack? Nothing; Fluid restriction: 1200 mL Fluid; Room service appropriate? Yes; Fluid consistency: Thin  Diet effective now              EDUCATION NEEDS:   Education needs have been addressed  Skin:  Skin Assessment: Skin Integrity Issues: Incisions: head  Last BM:  11/4  Height:   Ht Readings from Last 1 Encounters:  07/20/18 5\' 11"  (1.803 m)    Weight:   Wt Readings from Last 1 Encounters:  07/29/18 68.2 kg    Ideal Body Weight:  78.18 kg  BMI:  Body mass index is 20.97 kg/m.  Estimated Nutritional Needs:   Kcal:  2000-2200  Protein:  95-105 grams  Fluid:  UOP + 1000 ml    Gaynell Face, MS, RD, LDN Inpatient Clinical Dietitian Pager: [phone] Weekend/After Hours: [phone]

## 2018-07-30 NOTE — Progress Notes (Signed)
Patient information reviewed and entered into eRehab system by Daiva Nakayama, RN, CRRN, [facility] Coordinator.  Information including medical coding, functional ability and quality indicators will be reviewed and updated through discharge.     Per nursing patient was given "Data Collection Information Summary for Patients in Inpatient Rehabilitation Facilities with attached "Privacy Act [place] Records" upon admission.  Present in education notebook.

## 2018-07-31 ENCOUNTER — Inpatient Hospital Stay (HOSPITAL_COMMUNITY): Payer: Medicare Other | Admitting: Physical Therapy

## 2018-07-31 ENCOUNTER — Inpatient Hospital Stay (HOSPITAL_COMMUNITY): Payer: Medicare Other | Admitting: Occupational Therapy

## 2018-07-31 ENCOUNTER — Inpatient Hospital Stay (HOSPITAL_COMMUNITY): Payer: Medicare Other

## 2018-07-31 ENCOUNTER — Inpatient Hospital Stay (HOSPITAL_COMMUNITY): Payer: Medicare Other | Admitting: Speech Pathology

## 2018-07-31 DIAGNOSIS — S069X0D Unspecified intracranial injury without loss of consciousness, subsequent encounter: Secondary | ICD-10-CM

## 2018-07-31 LAB — GLUCOSE, CAPILLARY
Glucose-Capillary: 133 mg/dL — ABNORMAL HIGH (ref 70–99)
Glucose-Capillary: 161 mg/dL — ABNORMAL HIGH (ref 70–99)
Glucose-Capillary: 192 mg/dL — ABNORMAL HIGH (ref 70–99)
Glucose-Capillary: 67 mg/dL — ABNORMAL LOW (ref 70–99)
Glucose-Capillary: 93 mg/dL (ref 70–99)

## 2018-07-31 MED ORDER — CHLORHEXIDINE GLUCONATE CLOTH 2 % EX PADS
6.0000 | MEDICATED_PAD | Freq: Every day | CUTANEOUS | Status: DC
  Administered 2018-08-01: 6 via TOPICAL

## 2018-07-31 MED ORDER — SENNA 8.6 MG PO TABS
1.0000 | ORAL_TABLET | Freq: Every day | ORAL | Status: DC
  Administered 2018-07-31 – 2018-08-12 (×13): 8.6 mg via ORAL
  Filled 2018-07-31 (×12): qty 1

## 2018-07-31 NOTE — Progress Notes (Signed)
SLP Cancellation Note  Patient Details Name: [name] MRN: [id number] DOB: [birth year]   Cancelled treatment:        Attempted to see pt for therapy this morning.  Pt sleeping upon arrival and able to arouse for only brief periods of time.  Pt reports feeling nauseated and getting sick with OT this morning in the shower. RN reports giving compazine prior to therapist's arrival which could be contributing to drowsiness.  Pt declining to participate in therapy at this time.  Will continue to follow along as pt is able to participate in therapies.                                                                                                   Adie Vilar, Selinda Orion 07/31/2018, 2:57 PM

## 2018-07-31 NOTE — Progress Notes (Signed)
Physical Therapy Session Note  Patient Details  Name: [name] MRN: [id number] Date of Birth: Oct 02, 1940  Today's Date: 07/31/2018 PT Individual Time: 1330-1345 and 1500-1530 PT Individual Time Calculation (min): 15 min and 30 min (total 45 min)   Short Term Goals: Week 1:  PT Short Term Goal 1 (Week 1): Pt will perform bed mobility with S PT Short Term Goal 2 (Week 1): Pt will perform sit >stand with consistent minA PT Short Term Goal 3 (Week 1): Pt will ambulate 150' with min guard and LRAD PT Short Term Goal 4 (Week 1): Pt will ascend/descend 8 steps with min guard  Skilled Therapeutic Interventions/Progress Updates: Tx 1: Pt received in bed, lethargic and poor arousal with mod intensity sternal rubs and deep pressure to nailbed. Vitals assessed HR 73bpm, O2 100%, BP 101/60. Alerted RN/PA to pt status; following their assessment PA requested therapist get pt OOB and assess mobility. Pt aroused with increased time; modA supine>sit. Pt ambulates x10' with RW and minA; declines further ambulation and reports "I just don't feel good, I'm done". Note increased festinating gait pattern, freezing compared to PT evaluation yesterday. Returned to sitting in recliner; lap belt alarm in place, RN and wife present with all needs in reach at completion of session. Pt perseverative on "sleeping in a cold garage last night", doesn't understand why he can't go home. RN, therapist and wife provided encouragement, reinforced education regarding purpose of therapy to build strength and improve balance prior to returning home. Missed 45 min at beginning of session.   Tx 2: Pt received in recliner, awake and agreeable to treatment with encouragement. Provided ongoing support and education regarding purpose and goals of therapy. Stand pivot minA recliner<>w/c with one UE HHA. W/c propulsion BUE minA x50' for strengthening and aerobic endurance. Sit <>stand and standing balance one UE support while engaged in game  of horseshoes with PT student; minA to boost to standing, repetitive cues for hand placement to push from armrests. Standing tolerance 10-20 sec before returning to sitting despite cues to remain standing; pt reports he is weak and his back hurts with standing. Gait trial x15' with RW and minA; bright green theraband placed around RW as visual cue for larger step length, minimal improvement in gait quality. In ADL apartment pt ambulated x20' with RW and min guard, with dynamic reaching task to retrieve silverware from drawer. Returned to room totalA in w/c, stand pivot to recliner as above. Remained seated in recliner at end of session, sister and sister-in-law present, lap belt alarm intact and all needs in reach.      Therapy Documentation Precautions:  Precautions Precautions: Fall Precaution Comments: HD M-W-F Restrictions Weight Bearing Restrictions: No General: PT Amount of Missed Time (min): 45 Minutes PT Missed Treatment Reason: Other (Comment)(decr arousal) Pain: Pain Assessment Faces Pain Scale: Hurts a little bit    Therapy/Group: Individual Therapy  Corliss Skains 07/31/2018, 1:57 PM

## 2018-07-31 NOTE — Progress Notes (Signed)
[facility] Progress Note   Dialysis Orders: Dialysis Orders:MWF [place] right AVF 4 hrs 180 NRe 400/Autoflow 1.5 71 kg 2.0K/2.0 Ca  R AVF -Heparin 1800 units IV -Mircera 150 mcg IV q 2 weeks (last dose 07/16/18 Last HGB 11.4 07/16/18)  Assessment/Plan: 1. 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   2. ESRD - MWF, HD tomorrow. IF 1.5L yesterday.  EDW 71.3 > post weight yesterday 66.2kg (this AM 71.3kg but I don't think it's correct).  . Anemia - hgb 12.2 stable  4. Secondary hyperparathyroidism - Corr ca has been elevated even prior to hospitalization.  Changed to 2 Ca dialysate  and started sensipar 30 daily; calcitriol d/cd outpt. PTH was 217 10/23, corr ca 11.4 at that time. CTM  5. HTN/volume - Normotensive on non HD day, hypertensive prior to HD.  Challenging EDW with HD.  Currently no midodrine at present- trend for now. 6. Nutrition - albumin noted < 3.  Cont supplements and multivit 7. Disp - transferred to CIR 8. Dementia - baseline but  functional prior to acute event. 9. Multiple rib fx - stable 10. DM/hypothyroidism - meds per primary  Jannifer Hick MD [facility] Pager [phone]  Subjective:   Tol HD well yesterday.  On CIR.  C/o poor sleep due to being cold.  No other complaints this AM.   Objective Vitals:   07/30/18 1748 07/30/18 1928 07/31/18 0500 07/31/18 0558  BP: 125/60 (!) 125/56  (!) 92/59  Pulse: 79 87  75  Resp:  17    Temp: (!) 97.5 F (36.4 C) 98.7 F (37.1 C)  98 F (36.7 C)  TempSrc: Oral   Oral  SpO2: 100% (!) 75%  100%  Weight: 66.2 kg  71.3 kg    Physical Exam General: comfortable at 45 deg Heart: no rub Lungs: normal WOB, clear ant Abdomen: soft, nontender Extremities: no edema Dialysis Access: RUE AV access +thrill   Additional Objective Labs: Basic Metabolic Panel: Recent Labs  Lab 07/28/18 0830 07/29/18 1836 07/30/18 1355  NA 130* 135  132*  K 4.1 4.4 3.8  CL 93* 95* 95*  CO2 25 28 23   GLUCOSE 135* 119* 208*  BUN 38* 33* 53*  CREATININE 7.09* 5.55* 6.70*  CALCIUM 10.1 10.6* 10.2  PHOS 5.0* 4.7* 4.7*   Liver Function Tests: Recent Labs  Lab 07/28/18 0830 07/29/18 1836 07/30/18 1355  ALBUMIN 2.4* 2.7* 2.6*   No results for input(s): LIPASE, AMYLASE in the last 168 hours. CBC: Recent Labs  Lab 07/25/18 0623 07/27/18 0340 07/28/18 0830 07/29/18 1836 07/30/18 1354  WBC 7.7 7.5 9.1 8.1 9.0  HGB 11.0* 11.6* 11.4* 12.4* 12.2*  HCT 35.3* 36.5* 34.4* 40.0 37.8*  MCV 99.7 98.1 96.1 98.8 97.9  PLT 167 158 190 188 201   Blood Culture    Component Value Date/Time   SDES URINE, CATHETERIZED 01/23/2018 2043   [place] NONE 01/23/2018 2043   CULT  01/23/2018 2043    NO GROWTH Performed at [facility] [address] [place], [place] [address]    REPTSTATUS 01/25/2018 FINAL 01/23/2018 2043    Cardiac Enzymes: No results for input(s): CKTOTAL, CKMB, CKMBINDEX, TROPONINI in the last 168 hours. CBG: Recent Labs  Lab 07/30/18 0623 07/30/18 1213 07/30/18 1813 07/30/18 2128 07/31/18 0640  GLUCAP 98 125* 187* 104* 133*   Iron Studies: No results for input(s): IRON, TIBC, TRANSFERRIN, FERRITIN in the last 72  hours. Lab Results  Component Value Date   INR 0.97 07/22/2018   INR 1.01 06/05/2016   Studies/Results: No results found. Medications:  . Chlorhexidine Gluconate Cloth  6 each Topical Q0600  . cinacalcet  30 mg Oral Q supper  . docusate sodium  100 mg Oral BID  . donepezil  10 mg Oral QHS  . feeding supplement (NEPRO CARB STEADY)  237 mL Oral BID BM  . feeding supplement (PRO-STAT SUGAR FREE 64)  30 mL Oral BID  . insulin aspart  0-9 Units Subcutaneous TID WC  . levETIRAcetam  500 mg Oral BID  . levothyroxine  50 mcg Oral QAC breakfast  . liraglutide  0.6 mg Subcutaneous Q0600  . memantine  10 mg Oral BID  . multivitamin  1 tablet Oral QHS  . pantoprazole  40 mg Oral Daily  .  saccharomyces boulardii  250 mg Oral BID  . senna  1 tablet Oral QHS  . sevelamer carbonate  800 mg Oral Q supper  . simvastatin  20 mg Oral QHS  . vitamin B-12  1,000 mcg Oral Daily

## 2018-07-31 NOTE — Patient Care Conference (Signed)
Inpatient RehabilitationTeam Conference and Plan of Care Update Date: 07/30/2018   Time: 2:30 PM    Patient Name: [name]      Medical Record Number: [id number]  Date of Birth: [birth year] Sex: Male         Room/Bed: 4M02C/4M02C-01 Payor Info: Payor: MEDICARE / Plan: MEDICARE PART A AND B / Product Type: *No Product type* /    Admitting Diagnosis: bil subural  hematomas  Admit Date/Time:  07/29/2018  5:28 PM Admission Comments: No comment available   Primary Diagnosis:  <principal problem not specified> Principal Problem: <principal problem not specified>  Patient Active Problem List   Diagnosis Date Noted  . Labile blood pressure   . Seizure prophylaxis   . Anemia of chronic disease   . TBI (traumatic brain injury) ([place]) 07/29/2018  . 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 Loose stools   . Dementia arising in the senium and presenium ([place])   . 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 Multiple fractures of ribs, left side, initial encounter for closed fracture 07/21/2018  . Subdural hematoma ([place]) 07/21/2018  . Angiodysplasia of stomach   . Occult GI bleeding   . GIB (gastrointestinal bleeding) 06/01/2018  . Low back pain 01/17/2018  . Anorexia   . Dyspnea   . Macrocytic anemia 06/04/2016  . Thrombocytopenia ([place]) 06/04/2016  . Exertional dyspnea 06/04/2016  . Arm paresthesia, left 06/04/2016  . Dyspnea on exertion 06/04/2016  . Tenderness of right calf 06/04/2016  . Mild cognitive impairment with memory loss 01/12/2016  . ESRD on dialysis ([place]) 05/17/2015  . Hypoglycemia   . Weakness 02/18/2015  . OSA (obstructive sleep apnea) 02/02/2015  . Hyperkalemia 10/05/2014  . Chronic kidney disease (CKD), stage IV (severe) ([place]) 04/07/2014  . End stage renal disease ([place]) 11/18/2013  . BPH (benign prostatic hyperplasia) 11/22/2012  . Peripheral vascular disease ([place]) 12/24/2011  . Hyperparathyroidism ([place]) 12/24/2011  . Hypothyroidism  12/24/2011  . Allergic rhinitis 12/24/2011  . Erectile dysfunction 12/24/2011  . Insulin dependent diabetes mellitus ([place]) 09/03/2008  . Hyperlipidemia 09/03/2008  . Essential hypertension 08/30/2008  . PANCREATITIS, HX OF 08/30/2008  . RENAL FAILURE, ACUTE, HX OF 08/30/2008  . DIVERTICULOSIS, COLON 06/08/2003    Expected Discharge Date: Expected Discharge Date: 08/13/18  Team Members Present: Physician leading conference: Dr. Delice Lesch Social Worker Present: Ovidio Kin, LCSW Nurse Present: Dorien Chihuahua, RN PT Present: Kem Parkinson, PT OT Present: Napoleon Form, OT SLP Present: Windell Moulding, SLP PPS Coordinator present : Daiva Nakayama, RN, CRRN     Current Status/Progress Goal Weekly Team Focus  Medical   Functional and cognitive deficits secondary to bi-frontal-temporal [facility]  Improve mobility, safety, cognitoin, DM, stools  See above   Bowel/Bladder        cont bowel     Swallow/Nutrition/ Hydration             ADL's     eval pending        Mobility   minA bed mobility, min/modA sit <>stand, minA gait with RW and stairs  S overall  LE strengthening, dynamic balance, activity tolerance   Communication             Safety/Cognition/ Behavioral Observations  mod assist   supervision   mildly complex tasks    Pain             Skin                *  See Care Plan and progress notes for long and short-term goals.     Barriers to Discharge  Current Status/Progress Possible Resolutions Date Resolved   Physician    Medical stability     See above  Therapies, optimize DM meds, optimize BP meds, follow stools      Nursing  Incontinence               PT  Home environment access/layout  8 STE              OT                  SLP                SW                Discharge Planning/Teaching Needs:    Home with wife who can be there and provide light min assist if needed.     Team Discussion:  New eval setting goals-trying to reach supervision level  goals. Currently min-mod level of assist. Work on activity tolerance and BP issues. May make 15/7 due to endurance issues.  Revisions to Treatment Plan:  DC 11/20    Continued Need for Acute Rehabilitation Level of Care: The patient requires daily medical management by a physician with specialized training in physical medicine and rehabilitation for the following conditions: Daily direction of a multidisciplinary physical rehabilitation program to ensure safe treatment while eliciting the highest outcome that is of practical value to the patient.: Yes Daily medical management of patient stability for increased activity during participation in an intensive rehabilitation regime.: Yes Daily analysis of laboratory values and/or radiology reports with any subsequent need for medication adjustment of medical intervention for : Neurological problems;Renal problems;Mood/behavior problems;Diabetes problems;Other   I attest that I was present, lead the team conference, and concur with the assessment and plan of the team.   Elease Hashimoto 07/31/2018, 8:24 AM

## 2018-07-31 NOTE — Progress Notes (Signed)
Recreational Therapy Session Note  Patient Details  Name: [name] MRN: [id number] Date of Birth: 1940-09-27 Today's Date: 07/31/2018  Pt with low activity tolerance.  Eval deffered. Will continue to monitor through team for future participation. [place] 07/31/2018, 12:32 PM

## 2018-07-31 NOTE — Progress Notes (Signed)
Notify PA of pt somnolence and difficulty waking, pt had eaten lunch around noon and  Has been sleeping and difficult to arouse for therapy.  Pt wife concerned pt has more bleeding, assess pt, he wakes to voice, bilateral hand grips and LE strength is unchanged from previous assessment.  Notify Pam Love, PA to above concerns, she arrives shortly to assess pt.

## 2018-07-31 NOTE — Progress Notes (Signed)
Occupational Therapy Session Note  Patient Details  Name: [name] MRN: [id number] Date of Birth: June 25, 1941  Today's Date: 07/31/2018 OT Individual Time: 9574-7340 OT Individual Time Calculation (min): 57 min    Short Term Goals: Week 1:  OT Short Term Goal 1 (Week 1): Pt will transfer with min A to and from the BSC/ toilet with LRAD OT Short Term Goal 2 (Week 1): Pt will be able to don shirt from folded position with min A with VC for problem solving OT Short Term Goal 3 (Week 1): Pt will don LB clothing with mod A sit to stand including slide on shoes  OT Short Term Goal 4 (Week 1): Pt will stand to perform 2 grooming tasks at sink with min A; showing improve endurance  Skilled Therapeutic Interventions/Progress Updates:    Pt seen for OT ADL bathing/dressing session. Pt in supine upon arrival, agreeable to tx session, however, throughout first half of session required significantly increased time, encouragement, and education regarding benefits of activity and importance of participation in therapy session in order to get to d/c level. Pt initially declining pain, however, with mobility requested to alert RN for pain meds. RN arrived reporting pt already received pain meds with AM meds, pt unable to recall. Pt reluctantly agreeable to shower this AM. Throughout session, completed stand pivot transfers at overall mod A, however, last transfer of session when returning to bed, pt able to stand w/c>RW with supervision.  He bathed seated on padded tub bench in shower, required VCs fr sequencing throughout as well as rest breaks following addressing each area of body. Upon set-up to exit shower, pt with massive vomiting episode. Following emesis, pt voiced feeling fine and willing to cont with transfer out of shower. RN made aware and pt left seated EOB at end of session with RN assessing vitals.  Pt dressed seated in w/c, able to correctly orient shirt, max A provided for threading pants and  donning socks 2/2 fatigue. He was able to stand with use of grab bar and pull pants up with steadying assist.  Pt left EOB at end of session with hand off to RN.   Therapy Documentation Precautions:  Precautions Precautions: Fall Precaution Comments: HD M-W-F Restrictions Weight Bearing Restrictions: No ADL: ADL Grooming: Moderate assistance Where Assessed-Grooming: Sitting at sink Upper Body Bathing: Minimal assistance Where Assessed-Upper Body Bathing: Chair Lower Body Bathing: Maximal assistance Where Assessed-Lower Body Bathing: Chair Upper Body Dressing: Minimal cueing Where Assessed-Upper Body Dressing: Chair Lower Body Dressing: Maximal assistance Where Assessed-Lower Body Dressing: Chair Tub/Shower Equipment: Nurse, mental health Transfer: Moderate assistance Social research officer, government Method: Radiographer, therapeutic: Radio broadcast assistant, Grab bars   Therapy/Group: Individual Therapy  Jeffie Widdowson L 07/31/2018, 7:06 AM

## 2018-07-31 NOTE — Progress Notes (Signed)
[facility] PHYSICAL MEDICINE & REHABILITATION PROGRESS NOTE  Subjective/Complaints: Patient seen laying in bed this morning.  He states he did not sleep well overnight because he was cold all night.  ROS: Unable to assess due to cognition  Objective: Vital Signs: Blood pressure (!) 92/59, pulse 75, temperature 98 F (36.7 C), temperature source Oral, resp. rate 17, weight 71.3 kg, SpO2 100 %. No results found. Recent Labs    07/29/18 1836 07/30/18 1354  WBC 8.1 9.0  HGB 12.4* 12.2*  HCT 40.0 37.8*  PLT 188 201   Recent Labs    07/29/18 1836 07/30/18 1355  NA 135 132*  K 4.4 3.8  CL 95* 95*  CO2 28 23  GLUCOSE 119* 208*  BUN 33* 53*  CREATININE 5.55* 6.70*  CALCIUM 10.6* 10.2    Physical Exam: BP (!) 92/59   Pulse 75   Temp 98 F (36.7 C) (Oral)   Resp 17   Wt 71.3 kg   SpO2 100%   BMI 21.92 kg/m  Constitutional: Well-developed. NAD HENT:Bilateral crani incisions with staples. Eyes:EOMI. No discharge.   Cardiovascular:RRR.  No JVD. Respiratory:Effort normal.  Clear.  GI: He exhibitsno distension. Bowel sounds normal.  Musculoskeletal: He exhibits noedema and tenderness. Neurological: Alert and Oriented x 1.  Able to follow simple one step motor commands.  Motor: B/l UE 4/5 proximal to distal B/l LE: HF 3/5, KE 3+/5, ADF 4/5 (?  Effort) HOH Skin: Warm and dry. Intact. Staples C/D/I  Assessment/Plan: 1. 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  Physiatrist is providing close team supervision and 24 hour management of active medical problems listed below.  Physiatrist and rehab team continue to assess barriers to discharge/monitor patient progress toward functional and medical goals  Care Tool:  Bathing    Body parts bathed by patient: Right arm, Left arm, Chest, Abdomen, Face   Body parts bathed by helper: Front perineal area,  Buttocks, Left upper leg, Right lower leg, Left lower leg     Bathing assist Assist Level: Moderate Assistance - Patient 50 - 74%     Upper Body Dressing/Undressing Upper body dressing   What is the patient wearing?: Pull over shirt    Upper body assist Assist Level: Maximal Assistance - Patient 25 - 49%    Lower Body Dressing/Undressing Lower body dressing      What is the patient wearing?: Pants     Lower body assist Assist for lower body dressing: Maximal Assistance - Patient 25 - 49%     Toileting Toileting    Toileting assist Assist for toileting: Moderate Assistance - Patient 50 - 74%     Transfers Chair/bed transfer  Transfers assist     Chair/bed transfer assist level: Moderate Assistance - Patient 50 - 74%     Locomotion Ambulation   Ambulation assist      Assist level: Minimal Assistance - Patient > 75% Assistive device: Walker-rolling Max distance: 50   Walk 10 feet activity   Assist     Assist level: Minimal Assistance - Patient > 75% Assistive device: Walker-rolling   Walk 50 feet activity   Assist    Assist level: Minimal Assistance - Patient > 75%      Walk 150 feet activity   Assist Walk 150 feet activity did not occur: Safety/medical concerns(fatigue)         Walk 10 feet on uneven surface  activity  Assist Walk 10 feet on uneven surfaces activity did not occur: Safety/medical concerns         Wheelchair     Assist Will patient use wheelchair at discharge?: No             Wheelchair 50 feet with 2 turns activity    Assist            Wheelchair 150 feet activity     Assist            Medical Problem List and Plan: 1.Functional and cognitive deficitssecondary to bi-frontal-temporal SDH's status post bur holes on 10/29.   Continue CIR  Per neurosurgery, repeat CT if symptoms worsen  2. DVT Prophylaxis/Anticoagulation: Mechanical:Sequential compression devices, below  kneeBilateral lower extremities 3. Pain Management:tylenol pron 4. Mood:LCSW to follow for evaluation and support. 5. Neuropsych: This patientis notcapable of making decisions on hisown behalf. 6. Skin/Wound Care:routine pressure relief measures. 7. Fluids/Electrolytes/Nutrition:Monitor I/Os. Labs with HD 8. ESRD: HD MWF at end of the day to help with tolerance of therapy.  9. CAD: Stable  10 T2DM: Monitor BS ac/hs and use SSI for elevated BS.Was on Victoza at home due to multiple hypoglycemic episodes with long acting insulin.  Slightly labile on 11/7 11. Seizure prophylaxis: to continue Keppra bid for 1 more days. 12. H/O IBS: Discontinued laxatives due to reports of incontinence due to diarrhea:  13. Dementia: On Nameda and Aricept. 14. Loose stools. Wife reports history of cycling loose and then constipated stools PTA.   Senna started on 11/7 Added probiotic (?IBS) 15.  Labile blood pressure  Labile on 11/ 7 16.  Anemia of chronic disease  Hemoglobin 12.2 on 11/6  Continue to monitor   LOS: 2 days A FACE TO FACE EVALUATION WAS PERFORMED  Ankit [name] 07/31/2018, 8:11 AM

## 2018-07-31 NOTE — Progress Notes (Signed)
Social Work Patient ID: [name], male   DOB: [birth year], 77 y.o.   MRN: [id number] Met with pt and wife to discuss team conference goals-supervision level and target discharge date 11/20. Pt is upset with this date feels it is too long. Wife concerned of how pt is acting and informed by RN that MD ordered a CT scan for today to check his head. Discussed with pt his need to be mobile so he can go back and forth to HD. He feels he can go home now. Have encouraged wife to attend therapies with pt to see his levels. Continue to follow and work on discharge needs.

## 2018-07-31 NOTE — Progress Notes (Signed)
Patient with episode of vomiting with OT.  Per reports was sleepy and hard to arouse therefore missed PT prior to lunch.  On exam patient sleeping but once aroused was able to state what he had for lunch today.  No complaints of headache or other symptoms.  He did have poor sleep last night and noted that he received trazodone at 1 PM.  Will DC this.  Continue sleep-wake chart for now.  Discussed with wife issues regarding sleep-wake disruption as well as poor activity tolerance.  She is concerned about the bleed in his head as well as concerned about it enlarging and had questions about how frequently CT head would be repeated--discussed Dr. Clarice Pole recommendations.  Relate that patient symptoms likely due to sleep-wake disruption.  Will monitor how he does with PT this afternoon as now is awakening.  Will order CT for follow-up.

## 2018-08-01 ENCOUNTER — Inpatient Hospital Stay (HOSPITAL_COMMUNITY): Payer: Medicare Other | Admitting: Speech Pathology

## 2018-08-01 ENCOUNTER — Inpatient Hospital Stay (HOSPITAL_COMMUNITY): Payer: Medicare Other | Admitting: Physical Therapy

## 2018-08-01 ENCOUNTER — Inpatient Hospital Stay (HOSPITAL_COMMUNITY): Payer: Medicare Other | Admitting: Occupational Therapy

## 2018-08-01 DIAGNOSIS — S069X0S Unspecified intracranial injury without loss of consciousness, sequela: Secondary | ICD-10-CM

## 2018-08-01 LAB — CBC
HCT: 36.9 % — ABNORMAL LOW (ref 39.0–52.0)
Hemoglobin: 11.5 g/dL — ABNORMAL LOW (ref 13.0–17.0)
MCH: 30.5 pg (ref 26.0–34.0)
MCHC: 31.2 g/dL (ref 30.0–36.0)
MCV: 97.9 fL (ref 80.0–100.0)
Platelets: 184 10*3/uL (ref 150–400)
RBC: 3.77 MIL/uL — ABNORMAL LOW (ref 4.22–5.81)
RDW: 15.6 % — ABNORMAL HIGH (ref 11.5–15.5)
WBC: 9.9 10*3/uL (ref 4.0–10.5)
nRBC: 0 % (ref 0.0–0.2)

## 2018-08-01 LAB — RENAL FUNCTION PANEL
Albumin: 2.7 g/dL — ABNORMAL LOW (ref 3.5–5.0)
Anion gap: 12 (ref 5–15)
BUN: 49 mg/dL — ABNORMAL HIGH (ref 8–23)
CO2: 29 mmol/L (ref 22–32)
Calcium: 10.5 mg/dL — ABNORMAL HIGH (ref 8.9–10.3)
Chloride: 95 mmol/L — ABNORMAL LOW (ref 98–111)
Creatinine, Ser: 6.34 mg/dL — ABNORMAL HIGH (ref 0.61–1.24)
GFR calc Af Amer: 9 mL/min — ABNORMAL LOW (ref >60)
GFR calc non Af Amer: 8 mL/min — ABNORMAL LOW (ref >60)
Glucose, Bld: 131 mg/dL — ABNORMAL HIGH (ref 70–99)
Phosphorus: 5.3 mg/dL — ABNORMAL HIGH (ref 2.5–4.6)
Potassium: 4.3 mmol/L (ref 3.5–5.1)
Sodium: 136 mmol/L (ref 135–145)

## 2018-08-01 LAB — GLUCOSE, CAPILLARY
Glucose-Capillary: 111 mg/dL — ABNORMAL HIGH (ref 70–99)
Glucose-Capillary: 215 mg/dL — ABNORMAL HIGH (ref 70–99)
Glucose-Capillary: 194 mg/dL — ABNORMAL HIGH (ref 70–99)
Glucose-Capillary: 156 mg/dL — ABNORMAL HIGH (ref 70–99)

## 2018-08-01 MED ORDER — MELATONIN 3 MG PO TABS
3.0000 mg | ORAL_TABLET | Freq: Every evening | ORAL | Status: DC | PRN
  Administered 2018-08-01 – 2018-08-09 (×6): 3 mg via ORAL
  Filled 2018-08-01 (×8): qty 1

## 2018-08-01 MED ORDER — MIDODRINE HCL 5 MG PO TABS
ORAL_TABLET | ORAL | Status: AC
  Filled 2018-08-01: qty 2

## 2018-08-01 MED ORDER — MIDODRINE HCL 5 MG PO TABS: 10.0000 mg | ORAL_TABLET | ORAL | Status: DC

## 2018-08-01 MED ORDER — MIDODRINE HCL 5 MG PO TABS
10.0000 mg | ORAL_TABLET | ORAL | Status: DC
  Administered 2018-08-01 – 2018-08-13 (×6): 10 mg via ORAL
  Filled 2018-08-01 (×3): qty 2

## 2018-08-01 MED ORDER — ACETAMINOPHEN 325 MG PO TABS: 325.0000 mg | ORAL_TABLET | ORAL | Status: DC | PRN

## 2018-08-01 MED ORDER — NON FORMULARY: 3.0000 mg | Freq: Every evening | Status: DC | PRN

## 2018-08-01 MED ORDER — QUETIAPINE FUMARATE 25 MG PO TABS
12.5000 mg | ORAL_TABLET | ORAL | Status: AC
  Administered 2018-08-01: 12.5 mg via ORAL

## 2018-08-01 NOTE — Progress Notes (Signed)
Occupational Therapy Session Note  Patient Details  Name: [name] MRN: [id number] Date of Birth: 1941/06/17  Today's Date: 08/01/2018 OT Individual Time: 8182-9937 OT Individual Time Calculation (min): 57 min    Short Term Goals: Week 1:  OT Short Term Goal 1 (Week 1): Pt will transfer with min A to and from the BSC/ toilet with LRAD OT Short Term Goal 2 (Week 1): Pt will be able to don shirt from folded position with min A with VC for problem solving OT Short Term Goal 3 (Week 1): Pt will don LB clothing with mod A sit to stand including slide on shoes  OT Short Term Goal 4 (Week 1): Pt will stand to perform 2 grooming tasks at sink with min A; showing improve endurance  Skilled Therapeutic Interventions/Progress Updates:    Pt seen for OT session focusing on ADL re-training with emphasis on participation and independence with self- care tasks.  PT sitting up in bed upon arrival, easily awoken and with encouragement willing to participate in tx session.  He transferred to EOB with min A and cuing for sequencing/technique.; Mod A to stand to EOB and ambulated ~5 ft to w/c with max cuing for RW management and shuffling ambulation noted. HE completed grooming tasks from w/c level at sink required max cuing/encouragement for participation/independence as pt refusing to complete tasks such as obtaining toothbrush off counter or removing lid from self-care item. He dressed seated in w/c, see below for assist levels with ADLs. Following extended seated rest break, he ambulated in unit hallway, ~28ft x2 trials with seated rest break btwn trials. VCs  For RW management/ safety and CGA. Pt left seated in w/c at end of session with RN present to assess vitals.    Therapy Documentation Precautions:  Precautions Precautions: Fall Precaution Comments: HD M-W-F Restrictions Weight Bearing Restrictions: No Pain:   Back ache with activity, un-rated. RN aware. Repositioned for  comfort ADL: ADL Grooming: Moderate assistance Where Assessed-Grooming: Sitting at sink Upper Body Bathing: Minimal assistance Where Assessed-Upper Body Bathing: Chair Lower Body Bathing: Maximal assistance Where Assessed-Lower Body Bathing: Chair Upper Body Dressing: Minimal cueing  Where Assessed-Upper Body Dressing: Chair Lower Body Dressing: Maximal assistance Where Assessed-Lower Body Dressing: Chair   Therapy/Group: Individual Therapy  Sarim Rothman L 08/01/2018, 6:58 AM

## 2018-08-01 NOTE — Progress Notes (Signed)
[facility] PHYSICAL MEDICINE & REHABILITATION PROGRESS NOTE  Subjective/Complaints:  Awakens briefly to voice, CT results reviewed ROS: Unable to assess due to cognition  Objective: Vital Signs: Blood pressure (!) 83/52, pulse 74, temperature 98.4 F (36.9 C), temperature source Oral, resp. rate 18, weight 71.2 kg, SpO2 100 %. Ct Head Wo Contrast  Result Date: 07/31/2018 CLINICAL DATA:  Altered level of consciousness. Status post subdural evacuation July 22, 2018. EXAM: CT HEAD WITHOUT CONTRAST TECHNIQUE: Contiguous axial images were obtained from the base of the skull through the vertex without intravenous contrast. COMPARISON:  CT HEAD July 28, 2018 FINDINGS: BRAIN: Similar bilateral holo hemispheric heterogeneous subdural collections measuring to 19 mm LEFT anterior cranial fossa. Linear possible membrane formation bilaterally. Small volume extra-axial pneumocephalus. No rebleed. Mild mass effect on LEFT frontal lobe, 4 mm LEFT or RIGHT midline shift. No ventricular entrapment. No intraparenchymal hemorrhage or acute large vascular territory infarct. Basal cisterns are patent. VASCULAR: Moderate calcific atherosclerosis of the carotid siphons. SKULL: No skull fracture. Bifrontal burr holes with overlying soft tissue swelling and skin staples. No significant scalp soft tissue swelling. SINUSES/ORBITS: Paranasal sinuses are well aerated. Trace RIGHT mastoid effusion.The included ocular globes and orbital contents are non-suspicious. Status post bilateral ocular lens implants. OTHER: None. IMPRESSION: 1. Status post burr hole placement for evacuation of subdural hematomas. Stable residual collections measuring to 19 mm on the LEFT. 2. 4 mm LEFT-to-RIGHT midline shift without ventricular entrapment. Electronically Signed   By: Elon Alas M.D.   On: 07/31/2018 19:34   Recent Labs    07/30/18 1354 08/01/18 0455  WBC 9.0 9.9  HGB 12.2* 11.5*  HCT 37.8* 36.9*  PLT 201 184   Recent  Labs    07/30/18 1355 08/01/18 0455  NA 132* 136  K 3.8 4.3  CL 95* 95*  CO2 23 29  GLUCOSE 208* 131*  BUN 53* 49*  CREATININE 6.70* 6.34*  CALCIUM 10.2 10.5*    Physical Exam: BP (!) 83/52 (BP Location: Right Leg) Comment: RN Notified  Pulse 74   Temp 98.4 F (36.9 C) (Oral)   Resp 18   Wt 71.2 kg   SpO2 100%   BMI 21.89 kg/m  Constitutional: Well-developed. NAD HENT:Bilateral crani incisions with staples. Eyes:EOMI. No discharge.   Cardiovascular:RRR.  No JVD. Respiratory:Effort normal.  Clear.  GI: He exhibitsno distension. Bowel sounds normal.  Musculoskeletal: He exhibits noedema and tenderness. Neurological: Alert and Oriented x 1.  Able to follow simple one step motor commands.  Motor: B/l UE 4/5 proximal to distal B/l LE: HF 3/5, KE 3+/5, ADF 4/5 (?  Effort) HOH Skin: Warm and dry. Intact. Staples C/D/I  Assessment/Plan: 1. 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  Physiatrist is providing close team supervision and 24 hour management of active medical problems listed below.  Physiatrist and rehab team continue to assess barriers to discharge/monitor patient progress toward functional and medical goals  Care Tool:  Bathing    Body parts bathed by patient: Right arm, Left arm, Chest, Abdomen, Face, Front perineal area, Buttocks, Right upper leg, Left upper leg   Body parts bathed by helper: Right lower leg, Left lower leg     Bathing assist Assist Level: Moderate Assistance - Patient 50 - 74%     Upper Body Dressing/Undressing Upper body dressing   What is the patient wearing?: Pull over shirt    Upper body assist Assist  Level: Supervision/Verbal cueing    Lower Body Dressing/Undressing Lower body dressing      What is the patient wearing?: Pants, Incontinence brief     Lower body assist Assist for lower body dressing:  Moderate Assistance - Patient 50 - 74%     Toileting Toileting Toileting Activity did not occur (Clothing management and hygiene only): N/A (no void or bm)  Toileting assist Assist for toileting: Moderate Assistance - Patient 50 - 74%     Transfers Chair/bed transfer  Transfers assist     Chair/bed transfer assist level: Minimal Assistance - Patient > 75%     Locomotion Ambulation   Ambulation assist      Assist level: Minimal Assistance - Patient > 75% Assistive device: Walker-rolling Max distance: 20   Walk 10 feet activity   Assist     Assist level: Minimal Assistance - Patient > 75% Assistive device: Walker-rolling   Walk 50 feet activity   Assist    Assist level: Minimal Assistance - Patient > 75%      Walk 150 feet activity   Assist Walk 150 feet activity did not occur: Safety/medical concerns(fatigue)         Walk 10 feet on uneven surface  activity   Assist Walk 10 feet on uneven surfaces activity did not occur: Safety/medical concerns         Wheelchair     Assist Will patient use wheelchair at discharge?: No      Wheelchair assist level: Minimal Assistance - Patient > 75% Max wheelchair distance: 50    Wheelchair 50 feet with 2 turns activity    Assist        Assist Level: Minimal Assistance - Patient > 75%   Wheelchair 150 feet activity     Assist            Medical Problem List and Plan: 1.Functional and cognitive deficitssecondary to bi-frontal-temporal SDH's status post bur holes on 10/29.   Continue CIR  CT stable on 11/7, stop seroquel given lethargy2. DVT Prophylaxis/Anticoagulation: Mechanical:Sequential compression devices, below kneeBilateral lower extremities 3. Pain Management:tylenol pron 4. Mood:LCSW to follow for evaluation and support. 5. Neuropsych: This patientis notcapable of making decisions on hisown behalf. 6. Skin/Wound Care:routine pressure relief measures. 7.  Fluids/Electrolytes/Nutrition:Monitor I/Os. Labs with HD 8. ESRD: HD MWF at end of the day to help with tolerance of therapy.  9. CAD: Stable  10 T2DM: Monitor BS ac/hs and use SSI for elevated BS.Was on Victoza at home due to multiple hypoglycemic episodes with long acting insulin.  Slightly labile on 11/7- on SSI CBG (last 3)  Recent Labs    07/31/18 2123 07/31/18 2147 08/01/18 0633  GLUCAP 67* 93 111*   11. Seizure prophylaxis: to continue Keppra bid through today 12. H/O IBS: Discontinued laxatives due to reports of incontinence due to diarrhea:  13. Dementia: On Nameda and Aricept. 14. Loose stools. Wife reports history of cycling loose and then constipated stools PTA.   Senna started on 11/7 Added probiotic (?IBS) 15.  Labile blood pressure  Labile on 11/ 7-low may need TEDs and IVF- vs adjust dialysis Defer to Nephro on this 16.  Anemia of chronic disease  Hemoglobin 12.2 on 11/6  Continue to monitor   LOS: 3 days A FACE TO FACE EVALUATION WAS PERFORMED  Charlett Blake 08/01/2018, 7:33 AM

## 2018-08-01 NOTE — Progress Notes (Signed)
[facility] Progress Note   Dialysis Orders: Dialysis Orders:MWF [place] right AVF 4 hrs 180 NRe 400/Autoflow 1.5 71 kg 2.0K/2.0 Ca  R AVF -Heparin 1800 units IV -Mircera 150 mcg IV q 2 weeks (last dose 07/16/18 Last HGB 11.4 07/16/18)  Assessment/Plan: 1. 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   2. ESRD - MWF, HD today. UF 1.5L.  EDW 71.3 > post weight last tx 66.2kg  . Anemia - hgb 11.5 stable  4. Secondary hyperparathyroidism - Corr ca has been elevated even prior to hospitalization.  Changed to 2 Ca dialysate  and started sensipar 30 daily; calcitriol d/cd outpt. PTH was 217 10/23, corr ca 11.4 at that time. CTM - it's improved, may need sensipar 60. 5. HTN/volume - Hypotension noted again - resumed pre HD midodrine.  No sign of infection.  CTM. 6. Nutrition - albumin noted < 3.  Cont supplements and multivit 7. Disp - transferred to CIR 8. Dementia - baseline but  functional prior to acute event. 9. Multiple rib fx - stable 10. DM/hypothyroidism - meds per primary  Jannifer Hick MD [facility] Pager [phone]  Subjective:   Seen on HD today - no complaints.    Objective Vitals:   08/01/18 0933 08/01/18 1300 08/01/18 1307 08/01/18 1400  BP: (!) 144/80 (!) 95/38 (!) 126/57 108/60  Pulse: 70 72 68 79  Resp: 20 18    Temp:  98.4 F (36.9 C)    TempSrc:  Oral    SpO2: 100%     Weight: 65.5 kg      Physical Exam General: comfortable at 45 deg Heart: no rub Lungs: normal WOB, clear ant Abdomen: soft, nontender Extremities: no edema Dialysis Access: RUE AV access +thrill   Additional Objective Labs: Basic Metabolic Panel: Recent Labs  Lab 07/29/18 1836 07/30/18 1355 08/01/18 0455  NA 135 132* 136  K 4.4 3.8 4.3  CL 95* 95* 95*  CO2 28 23 29   GLUCOSE 119* 208* 131*  BUN 33* 53* 49*  CREATININE 5.55* 6.70* 6.34*  CALCIUM 10.6* 10.2 10.5*  PHOS 4.7* 4.7* 5.3*   Liver  Function Tests: Recent Labs  Lab 07/29/18 1836 07/30/18 1355 08/01/18 0455  ALBUMIN 2.7* 2.6* 2.7*   No results for input(s): LIPASE, AMYLASE in the last 168 hours. CBC: Recent Labs  Lab 07/27/18 0340 07/28/18 0830 07/29/18 1836 07/30/18 1354 08/01/18 0455  WBC 7.5 9.1 8.1 9.0 9.9  HGB 11.6* 11.4* 12.4* 12.2* 11.5*  HCT 36.5* 34.4* 40.0 37.8* 36.9*  MCV 98.1 96.1 98.8 97.9 97.9  PLT 158 190 188 201 184   Blood Culture    Component Value Date/Time   SDES URINE, CATHETERIZED 01/23/2018 2043   [place] NONE 01/23/2018 2043   CULT  01/23/2018 2043    NO GROWTH Performed at [facility] [address], [place], [place] [address]    REPTSTATUS 01/25/2018 FINAL 01/23/2018 2043    Cardiac Enzymes: No results for input(s): CKTOTAL, CKMB, CKMBINDEX, TROPONINI in the last 168 hours. CBG: Recent Labs  Lab 07/31/18 1726 07/31/18 2123 07/31/18 2147 08/01/18 0633 08/01/18 1207  GLUCAP 192* 67* 93 111* 215*   Iron Studies: No results for input(s): IRON, TIBC, TRANSFERRIN, FERRITIN in the last 72 hours. Lab Results  Component Value Date   INR 0.97 07/22/2018   INR 1.01 06/05/2016   Studies/Results: Ct Head Wo Contrast  Result Date: 07/31/2018 CLINICAL DATA:  Altered  level of consciousness. Status post subdural evacuation July 22, 2018. EXAM: CT HEAD WITHOUT CONTRAST TECHNIQUE: Contiguous axial images were obtained from the base of the skull through the vertex without intravenous contrast. COMPARISON:  CT HEAD July 28, 2018 FINDINGS: BRAIN: Similar bilateral holo hemispheric heterogeneous subdural collections measuring to 19 mm LEFT anterior cranial fossa. Linear possible membrane formation bilaterally. Small volume extra-axial pneumocephalus. No rebleed. Mild mass effect on LEFT frontal lobe, 4 mm LEFT or RIGHT midline shift. No ventricular entrapment. No intraparenchymal hemorrhage or acute large vascular territory infarct. Basal cisterns are patent. VASCULAR:  Moderate calcific atherosclerosis of the carotid siphons. SKULL: No skull fracture. Bifrontal burr holes with overlying soft tissue swelling and skin staples. No significant scalp soft tissue swelling. SINUSES/ORBITS: Paranasal sinuses are well aerated. Trace RIGHT mastoid effusion.The included ocular globes and orbital contents are non-suspicious. Status post bilateral ocular lens implants. OTHER: None. IMPRESSION: 1. Status post burr hole placement for evacuation of subdural hematomas. Stable residual collections measuring to 19 mm on the LEFT. 2. 4 mm LEFT-to-RIGHT midline shift without ventricular entrapment. Electronically Signed   By: Elon Alas M.D.   On: 07/31/2018 19:34   Medications:  . Chlorhexidine Gluconate Cloth  6 each Topical Q0600  . Chlorhexidine Gluconate Cloth  6 each Topical Q0600  . cinacalcet  30 mg Oral Q supper  . docusate sodium  100 mg Oral BID  . donepezil  10 mg Oral QHS  . feeding supplement (NEPRO CARB STEADY)  237 mL Oral BID BM  . feeding supplement (PRO-STAT SUGAR FREE 64)  30 mL Oral BID  . insulin aspart  0-9 Units Subcutaneous TID WC  . levothyroxine  50 mcg Oral QAC breakfast  . liraglutide  0.6 mg Subcutaneous Q0600  . memantine  10 mg Oral BID  . midodrine  10 mg Oral Q M,W,F-HD  . multivitamin  1 tablet Oral QHS  . pantoprazole  40 mg Oral Daily  . saccharomyces boulardii  250 mg Oral BID  . senna  1 tablet Oral QHS  . sevelamer carbonate  800 mg Oral Q supper  . simvastatin  20 mg Oral QHS  . vitamin B-12  1,000 mcg Oral Daily

## 2018-08-01 NOTE — Progress Notes (Signed)
Physical Therapy Session Note  Patient Details  Name: [name] MRN: [id number] Date of Birth: 1941-01-05  Today's Date: 08/01/2018 PT Individual Time: 1100-1200 PT Individual Time Calculation (min): 60 min   Short Term Goals: Week 1:  PT Short Term Goal 1 (Week 1): Pt will perform bed mobility with S PT Short Term Goal 2 (Week 1): Pt will perform sit >stand with consistent minA PT Short Term Goal 3 (Week 1): Pt will ambulate 150' with min guard and LRAD PT Short Term Goal 4 (Week 1): Pt will ascend/descend 8 steps with min guard  Skilled Therapeutic Interventions/Progress Updates: Pt received seated in recliner, denies pain and agreeable to treatment with encouragement. Gait x75' with RW and min guard, slow speed and cues for maintaining RW closer to body for safety. Ascent/descent 8 steps bilateral handrails step-to pattern with min guard. Second trial pt performed 4 steps only before fatigued and requesting rest break. Stand pivot no AD to mat table with min guard. One round sit >stand with RW and standing on red wedge for gastroc stretch while performing dynamic UE overhead reaching to facilitate righting reactions, reaching outside BOS. Pt reports limited by back pain. Gait x10' with RW and min guard. Sit >supine on mat table, pt laid on heat pack x5 min for pain management. Bridging 2x15 reps for glute max/core strengthening. Returned to sitting with mod cues for logroll technique as pt has insufficient strength/power production to sit straight up from supine. Gait HHA x30' with minA; improved gait speed however very unstable and fatigues quickly. Remained in w/c at end of session, hot pack applied to low back for pain control. Required encouragement to setup own food for lunch after reporting his arms are too weak; able to do all tasks with setup.      Therapy Documentation Precautions:  Precautions Precautions: Fall Precaution Comments: HD M-W-F Restrictions Weight Bearing  Restrictions: No    Therapy/Group: Individual Therapy  Corliss Skains 08/01/2018, 12:11 PM

## 2018-08-01 NOTE — Plan of Care (Signed)
  Problem: Consults Goal: RH STROKE PATIENT EDUCATION Description See Patient Education module for education specifics  Outcome: Progressing   Problem: RH BOWEL ELIMINATION Goal: RH STG MANAGE BOWEL WITH ASSISTANCE Description STG Manage Bowel with min Assistance.  Outcome: Progressing   Problem: RH SKIN INTEGRITY Goal: RH STG SKIN FREE OF INFECTION/BREAKDOWN Description Skin free of breakdown and infection with min assist  Outcome: Progressing Goal: RH STG MAINTAIN SKIN INTEGRITY WITH ASSISTANCE Description STG Maintain Skin Integrity With min Assistance.  Outcome: Progressing   Problem: RH SAFETY Goal: RH STG ADHERE TO SAFETY PRECAUTIONS W/ASSISTANCE/DEVICE Description STG Adhere to Safety Precautions With min Assistance/Device.  Outcome: Progressing   Problem: RH COGNITION-NURSING Goal: RH STG USES MEMORY AIDS/STRATEGIES W/ASSIST TO PROBLEM SOLVE Description STG Uses Memory Aids/Strategies With min Assistance to Problem Solve.  Outcome: Progressing Goal: RH STG ANTICIPATES NEEDS/CALLS FOR ASSIST W/ASSIST/CUES Description STG Anticipates Needs/Calls for Assist With min Assistance/Cues.  Outcome: Progressing

## 2018-08-01 NOTE — Progress Notes (Signed)
Speech Language Pathology Daily Session Note  Patient Details  Name: [name] MRN: [id number] Date of Birth: 05/11/1941  Today's Date: 08/01/2018 SLP Individual Time: 1003-1047 SLP Individual Time Calculation (min): 44 min  Short Term Goals: Week 1: SLP Short Term Goal 1 (Week 1): Pt will utilize external aids to recall daily information with min assist verbal cues.   SLP Short Term Goal 2 (Week 1): Pt will complete mildly complex tasks with min assist for functional problem solving.   SLP Short Term Goal 3 (Week 1): Pt will recognize and correct errors in the moment during functional tasks with min assist verbal cues.    Skilled Therapeutic Interventions:  Pt was seen for skilled ST targeting cognitive goals.  Pt was received in recliner upon therapist's arrival, awake, alert, and agreeable to participating in [place].  Pt reports feeling much better in comparison to yesterday and was much more alert.  SLP reviewed and reinforced rationale for ST interventions and facilitated the session with a novel card game targeting recall and problem solving goals.  Initially, pt needed max cues to plan and execute a problem solving strategy.  However, as task progressed therapist was able to fade cues to min assist-supervision due to increased familiarity with rules and procedures.  Pt was returned to room and left in recliner with chair alarm set and call bell within reach.  Continue per current plan of care.    Pain Pain Assessment Pain Scale: 0-10 Pain Score: 0-No pain  Therapy/Group: Individual Therapy  Balen Woolum, Selinda Orion 08/01/2018, 11:03 AM

## 2018-08-02 ENCOUNTER — Inpatient Hospital Stay (HOSPITAL_COMMUNITY): Payer: Medicare Other | Admitting: Occupational Therapy

## 2018-08-02 ENCOUNTER — Inpatient Hospital Stay (HOSPITAL_COMMUNITY): Payer: Medicare Other | Admitting: Speech Pathology

## 2018-08-02 ENCOUNTER — Inpatient Hospital Stay (HOSPITAL_COMMUNITY): Payer: Medicare Other

## 2018-08-02 LAB — GLUCOSE, CAPILLARY
Glucose-Capillary: 123 mg/dL — ABNORMAL HIGH (ref 70–99)
Glucose-Capillary: 141 mg/dL — ABNORMAL HIGH (ref 70–99)
Glucose-Capillary: 249 mg/dL — ABNORMAL HIGH (ref 70–99)
Glucose-Capillary: 114 mg/dL — ABNORMAL HIGH (ref 70–99)

## 2018-08-02 LAB — OCCULT BLOOD X 1 CARD TO LAB, STOOL: Fecal Occult Bld: POSITIVE — AB

## 2018-08-02 NOTE — Progress Notes (Signed)
[facility] Progress Note   Dialysis Orders: Dialysis Orders:MWF [place] right AVF 4 hrs 180 NRe 400/Autoflow 1.5 71 kg 2.0K/2.0 Ca  R AVF -Heparin 1800 units IV -Mircera 150 mcg IV q 2 weeks (last dose 07/16/18 Last HGB 11.4 07/16/18)  Assessment/Plan: 1. 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   2. ESRD - MWF, HD today. UF 1.5L.  EDW 71.3 > morning weight 63.3kg . Anemia - hgb 11.5 stable yesterday 4. Secondary hyperparathyroidism - Corr ca has been elevated even prior to hospitalization.  Changed to 2 Ca dialysate  and started sensipar 30 daily; calcitriol d/cd outpt. PTH was 217 10/23, corr ca 11.4 at that time. CTM - it's improved, may need sensipar 60. Check labs tomorrow.  5. HTN/volume - Hypotension noted again yesterday - resumed pre HD midodrine which worked well.  This AM BP ok.  No sign of infection.  CTM. 6. Nutrition - albumin noted < 3.  Cont supplements and multivit 7. Disp - transferred to CIR 8. Dementia - baseline but  functional prior to acute event. 9. Multiple rib fx - stable 10. DM/hypothyroidism - meds per primary  Jannifer Hick MD [facility] Pager [phone]  Subjective: Tol HD yesterday without issue - UF 1.5L normotensive this AM resting comfortably  Objective Vitals:   08/01/18 1938 08/02/18 0449 08/02/18 0606 08/02/18 0614  BP: (!) 103/51 (!) 115/51 (!) 127/59 (!) 140/109  Pulse: 74 76 76 95  Resp: 16 19    Temp: 98.2 F (36.8 C) 98.5 F (36.9 C)    TempSrc:  Oral    SpO2:  97%    Weight:  63.3 kg     Physical Exam General: comfortable flat Heart:RRR Lungs: normal WOB Abdomen: soft, nontender Extremities: no edema Dialysis Access: RUE AV access +thrill   Additional Objective Labs: Basic Metabolic Panel: Recent Labs  Lab 07/29/18 1836 07/30/18 1355 08/01/18 0455  NA 135 132* 136  K 4.4 3.8 4.3  CL 95* 95* 95*  CO2 28 23 29   GLUCOSE 119*  208* 131*  BUN 33* 53* 49*  CREATININE 5.55* 6.70* 6.34*  CALCIUM 10.6* 10.2 10.5*  PHOS 4.7* 4.7* 5.3*   Liver Function Tests: Recent Labs  Lab 07/29/18 1836 07/30/18 1355 08/01/18 0455  ALBUMIN 2.7* 2.6* 2.7*   No results for input(s): LIPASE, AMYLASE in the last 168 hours. CBC: Recent Labs  Lab 07/27/18 0340 07/28/18 0830 07/29/18 1836 07/30/18 1354 08/01/18 0455  WBC 7.5 9.1 8.1 9.0 9.9  HGB 11.6* 11.4* 12.4* 12.2* 11.5*  HCT 36.5* 34.4* 40.0 37.8* 36.9*  MCV 98.1 96.1 98.8 97.9 97.9  PLT 158 190 188 201 184   Blood Culture    Component Value Date/Time   SDES URINE, CATHETERIZED 01/23/2018 2043   [place] NONE 01/23/2018 2043   CULT  01/23/2018 2043    NO GROWTH Performed at [facility] [address] [place], [place] [address]    REPTSTATUS 01/25/2018 FINAL 01/23/2018 2043    Cardiac Enzymes: No results for input(s): CKTOTAL, CKMB, CKMBINDEX, TROPONINI in the last 168 hours. CBG: Recent Labs  Lab 08/01/18 0633 08/01/18 1207 08/01/18 1735 08/01/18 2114 08/02/18 0619  GLUCAP 111* 215* 194* 156* 123*   Iron Studies: No results for input(s): IRON, TIBC, TRANSFERRIN, FERRITIN in the last 72 hours. Lab Results  Component Value Date   INR 0.97 07/22/2018   INR 1.01 06/05/2016   Studies/Results: Ct Head  Wo Contrast  Result Date: 07/31/2018 CLINICAL DATA:  Altered level of consciousness. Status post subdural evacuation July 22, 2018. EXAM: CT HEAD WITHOUT CONTRAST TECHNIQUE: Contiguous axial images were obtained from the base of the skull through the vertex without intravenous contrast. COMPARISON:  CT HEAD July 28, 2018 FINDINGS: BRAIN: Similar bilateral holo hemispheric heterogeneous subdural collections measuring to 19 mm LEFT anterior cranial fossa. Linear possible membrane formation bilaterally. Small volume extra-axial pneumocephalus. No rebleed. Mild mass effect on LEFT frontal lobe, 4 mm LEFT or RIGHT midline shift. No ventricular  entrapment. No intraparenchymal hemorrhage or acute large vascular territory infarct. Basal cisterns are patent. VASCULAR: Moderate calcific atherosclerosis of the carotid siphons. SKULL: No skull fracture. Bifrontal burr holes with overlying soft tissue swelling and skin staples. No significant scalp soft tissue swelling. SINUSES/ORBITS: Paranasal sinuses are well aerated. Trace RIGHT mastoid effusion.The included ocular globes and orbital contents are non-suspicious. Status post bilateral ocular lens implants. OTHER: None. IMPRESSION: 1. Status post burr hole placement for evacuation of subdural hematomas. Stable residual collections measuring to 19 mm on the LEFT. 2. 4 mm LEFT-to-RIGHT midline shift without ventricular entrapment. Electronically Signed   By: Elon Alas M.D.   On: 07/31/2018 19:34   Medications:  . Chlorhexidine Gluconate Cloth  6 each Topical Q0600  . Chlorhexidine Gluconate Cloth  6 each Topical Q0600  . cinacalcet  30 mg Oral Q supper  . docusate sodium  100 mg Oral BID  . donepezil  10 mg Oral QHS  . feeding supplement (NEPRO CARB STEADY)  237 mL Oral BID BM  . feeding supplement (PRO-STAT SUGAR FREE 64)  30 mL Oral BID  . insulin aspart  0-9 Units Subcutaneous TID WC  . levothyroxine  50 mcg Oral QAC breakfast  . liraglutide  0.6 mg Subcutaneous Q0600  . memantine  10 mg Oral BID  . midodrine  10 mg Oral Q M,W,F-HD  . multivitamin  1 tablet Oral QHS  . pantoprazole  40 mg Oral Daily  . saccharomyces boulardii  250 mg Oral BID  . senna  1 tablet Oral QHS  . sevelamer carbonate  800 mg Oral Q supper  . simvastatin  20 mg Oral QHS  . vitamin B-12  1,000 mcg Oral Daily

## 2018-08-02 NOTE — Progress Notes (Signed)
Call to Dr Letta Pate to report stool tested positive for occult blood.  Left message, pt had been given suppository and was straining to have bm. Stool was light brown in color with no s/s of blood in stool or when wiping pt. Report given to night nurse Alyse Low who will follow up with Dr Letta Pate in the AM.

## 2018-08-02 NOTE — Progress Notes (Signed)
Speech Language Pathology Daily Session Note  Patient Details  Name: [name] MRN: [id number] Date of Birth: 1941/06/30  Today's Date: 08/02/2018 SLP Individual Time: 1300-1345 SLP Individual Time Calculation (min): 45 min  Short Term Goals: Week 1: SLP Short Term Goal 1 (Week 1): Pt will utilize external aids to recall daily information with min assist verbal cues.   SLP Short Term Goal 2 (Week 1): Pt will complete mildly complex tasks with min assist for functional problem solving.   SLP Short Term Goal 3 (Week 1): Pt will recognize and correct errors in the moment during functional tasks with min assist verbal cues.    Skilled Therapeutic Interventions: Skilled treatment session focused on cognition goals. SLP received pt asleep in bed and he required more than a reasonable amount of time to arouse. However pt willing to participate. SLP facilitated session by providing Max A initial cues for instruction and implementation of novel card game. As game persisted, pt required decreased cues to Min A. Pt left semi-reclined in bed, bed alarm on and all needs within reach. Continue per current plan of care.      Pain Pain Assessment Pain Scale: 0-10 Pain Score: 0-No pain  Therapy/Group: Individual Therapy  [name] 08/02/2018, 2:45 PM

## 2018-08-02 NOTE — Progress Notes (Signed)
Occupational Therapy Session Note  Patient Details  Name: [name] MRN: [id number] Date of Birth: 1941-04-20  Today's Date: 08/02/2018 OT Individual Time:800-900 and 3536-1443 OT Individual Time Calculation (min): 60 min and 28 min    Short Term Goals: Week 1:  OT Short Term Goal 1 (Week 1): Pt will transfer with min A to and from the BSC/ toilet with LRAD OT Short Term Goal 2 (Week 1): Pt will be able to don shirt from folded position with min A with VC for problem solving OT Short Term Goal 3 (Week 1): Pt will don LB clothing with mod A sit to stand including slide on shoes  OT Short Term Goal 4 (Week 1): Pt will stand to perform 2 grooming tasks at sink with min A; showing improve endurance  Skilled Therapeutic Interventions/Progress Updates:    Session 1: Pt presents supine in bed with RN completing giving AM meds, pt agreeable to therapy session. No c/o pain. Pt transitions supine>sitting with supervision and HOB elevated. Requires modA for sit<>stand from EOB, ambulates EOB to bathroom toilet using RW, completing with minA but requires max cues for safe RW use and maintaining close proximity to RW. Pt requiring overall modA for toileting, able to assist with clothing management with steadying assist in standing. Due to fatigue performed stand pivot to w/c minA, completed additional grooming ADLs seated in w/c after seated rest break and with encouragement. Pt continues to report fatigue and requesting rest break in supine, performed stand pivot w/c>EOB with minA, S for sit>supine. After 64min rest break pt transitioned to sitting EOB and engaged in bil LE exercises x10-15 reps each including seated leg extension and seated marching. Pt returned to supine end of session with bed alarm set, needs in reach.   Session 2: Pt sleeping soundly upon entering room and requiring increased time to arouse. Pt sleepy but agreeable to therapy, no c/o pain. Due to time limitations tx session completed  in room. Pt transitioned to sitting EOB. Seated EOB pt participating in ball toss, chest press, and shoulder flexion while holding ball x10 reps each, x2 rounds. Additional focus on sit<>stand from EOB to RW. Pt completing x4 total; first two trials pt performing with single UE support on RW and requiring modA, trialed use of bil UEs pushing up from EOB for remaining trials - given increased cues for technique and anterior wt shift pt able to perform with minA. Pt returned to supine end of session where he was left with bed alarm set, call bell and needs within reach.   Therapy Documentation Precautions:  Precautions Precautions: Fall Precaution Comments: HD M-W-F Restrictions Weight Bearing Restrictions: No   Therapy/Group: Individual Therapy  [name] 08/02/2018, 8:20 AM

## 2018-08-02 NOTE — Progress Notes (Signed)
[facility] PHYSICAL MEDICINE & REHABILITATION PROGRESS NOTE  Subjective/Complaints:  Was lethargic yesterday am  But agitated yest pm ROS: Unable to assess due to cognition  Objective: Vital Signs: Blood pressure (!) 115/51, pulse 76, temperature 98.5 F (36.9 C), temperature source Oral, resp. rate 19, weight 63.3 kg, SpO2 97 %. Ct Head Wo Contrast  Result Date: 07/31/2018 CLINICAL DATA:  Altered level of consciousness. Status post subdural evacuation July 22, 2018. EXAM: CT HEAD WITHOUT CONTRAST TECHNIQUE: Contiguous axial images were obtained from the base of the skull through the vertex without intravenous contrast. COMPARISON:  CT HEAD July 28, 2018 FINDINGS: BRAIN: Similar bilateral holo hemispheric heterogeneous subdural collections measuring to 19 mm LEFT anterior cranial fossa. Linear possible membrane formation bilaterally. Small volume extra-axial pneumocephalus. No rebleed. Mild mass effect on LEFT frontal lobe, 4 mm LEFT or RIGHT midline shift. No ventricular entrapment. No intraparenchymal hemorrhage or acute large vascular territory infarct. Basal cisterns are patent. VASCULAR: Moderate calcific atherosclerosis of the carotid siphons. SKULL: No skull fracture. Bifrontal burr holes with overlying soft tissue swelling and skin staples. No significant scalp soft tissue swelling. SINUSES/ORBITS: Paranasal sinuses are well aerated. Trace RIGHT mastoid effusion.The included ocular globes and orbital contents are non-suspicious. Status post bilateral ocular lens implants. OTHER: None. IMPRESSION: 1. Status post burr hole placement for evacuation of subdural hematomas. Stable residual collections measuring to 19 mm on the LEFT. 2. 4 mm LEFT-to-RIGHT midline shift without ventricular entrapment. Electronically Signed   By: Elon Alas M.D.   On: 07/31/2018 19:34   Recent Labs    07/30/18 1354 08/01/18 0455  WBC 9.0 9.9  HGB 12.2* 11.5*  HCT 37.8* 36.9*  PLT 201 184   Recent  Labs    07/30/18 1355 08/01/18 0455  NA 132* 136  K 3.8 4.3  CL 95* 95*  CO2 23 29  GLUCOSE 208* 131*  BUN 53* 49*  CREATININE 6.70* 6.34*  CALCIUM 10.2 10.5*    Physical Exam: BP (!) 115/51 (BP Location: Right Leg)   Pulse 76   Temp 98.5 F (36.9 C) (Oral)   Resp 19   Wt 63.3 kg   SpO2 97%   BMI 19.46 kg/m  Constitutional: Well-developed. NAD HENT:Bilateral crani incisions with staples. Eyes:EOMI. No discharge.   Cardiovascular:RRR.  No JVD. Respiratory:Effort normal.  Clear.  GI: He exhibitsno distension. Bowel sounds normal.  Musculoskeletal: He exhibits noedema and tenderness. Neurological: Alert and Oriented x 1.  Able to follow simple one step motor commands.  Motor: B/l UE 4/5 proximal to distal B/l LE: HF 3/5, KE 3+/5, ADF 4/5 (?  Effort) HOH Skin: Warm and dry. Intact. Staples C/D/I  Assessment/Plan: 1. 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  Physiatrist is providing close team supervision and 24 hour management of active medical problems listed below.  Physiatrist and rehab team continue to assess barriers to discharge/monitor patient progress toward functional and medical goals  Care Tool:  Bathing    Body parts bathed by patient: Right arm, Left arm, Chest, Abdomen, Face, Front perineal area, Buttocks, Right upper leg, Left upper leg   Body parts bathed by helper: Right lower leg, Left lower leg     Bathing assist Assist Level: Moderate Assistance - Patient 50 - 74%     Upper Body Dressing/Undressing Upper body dressing   What is the patient wearing?: Button up shirt    Upper body assist Assist  Level: Minimal Assistance - Patient > 75%    Lower Body Dressing/Undressing Lower body dressing      What is the patient wearing?: Pants, Incontinence brief     Lower body assist Assist for lower body dressing: Moderate  Assistance - Patient 50 - 74%     Toileting Toileting Toileting Activity did not occur (Clothing management and hygiene only): N/A (no void or bm)  Toileting assist Assist for toileting: Moderate Assistance - Patient 50 - 74%     Transfers Chair/bed transfer  Transfers assist     Chair/bed transfer assist level: Minimal Assistance - Patient > 75%     Locomotion Ambulation   Ambulation assist      Assist level: Minimal Assistance - Patient > 75% Assistive device: Walker-rolling Max distance: 20   Walk 10 feet activity   Assist     Assist level: Minimal Assistance - Patient > 75% Assistive device: Walker-rolling   Walk 50 feet activity   Assist    Assist level: Minimal Assistance - Patient > 75%      Walk 150 feet activity   Assist Walk 150 feet activity did not occur: Safety/medical concerns(fatigue)         Walk 10 feet on uneven surface  activity   Assist Walk 10 feet on uneven surfaces activity did not occur: Safety/medical concerns         Wheelchair     Assist Will patient use wheelchair at discharge?: No      Wheelchair assist level: Minimal Assistance - Patient > 75% Max wheelchair distance: 50    Wheelchair 50 feet with 2 turns activity    Assist        Assist Level: Minimal Assistance - Patient > 75%   Wheelchair 150 feet activity     Assist            Medical Problem List and Plan: 1.Functional and cognitive deficitssecondary to bi-frontal-temporal SDH's status post bur holes on 10/29.   Continue CIR  CT stable on 11/7, stop seroquel given lethargy2. DVT Prophylaxis/Anticoagulation: Mechanical:Sequential compression devices, below kneeBilateral lower extremities 3. Pain Management:tylenol pron 4. Mood:LCSW to follow for evaluation and support. 5. Neuropsych: This patientis notcapable of making decisions on hisown behalf. 6. Skin/Wound Care:routine pressure relief measures. 7.  Fluids/Electrolytes/Nutrition:Monitor I/Os. Labs with HD 8. ESRD: HD MWF at end of the day to help with tolerance of therapy.  9. CAD: Stable  10 T2DM: Monitor BS ac/hs and use SSI for elevated BS.Was on Victoza at home due to multiple hypoglycemic episodes with long acting insulin.  Slightly labile on 11/8- on SSI- cont to monitor CBG (last 3)  Recent Labs    08/01/18 1207 08/01/18 1735 08/01/18 2114  GLUCAP 215* 194* 156*   11. Seizure prophylaxis:off  Keppra , may help with lethargy 12. H/O IBS: Discontinued laxatives due to reports of incontinence due to diarrhea:  13. Dementia: On Nameda and Aricept. 14. Loose stools. Wife reports history of cycling loose and then constipated stools PTA.   Senna started on 11/7 Added probiotic (?IBS) 15.  Labile blood pressure- controlled thus far tioday   Vitals:   08/01/18 1938 08/02/18 0449  BP: (!) 103/51 (!) 115/51  Pulse: 74 76  Resp: 16 19  Temp: 98.2 F (36.8 C) 98.5 F (36.9 C)  SpO2:  97%   16.  Anemia of chronic disease  Hemoglobin 11.5 on 11/8  Continue to monitor   LOS: 4 days A FACE TO FACE EVALUATION  WAS PERFORMED  Charlett Blake 08/02/2018, 5:45 AM

## 2018-08-02 NOTE — Plan of Care (Signed)
  Problem: Consults Goal: RH STROKE PATIENT EDUCATION Description See Patient Education module for education specifics  Outcome: Progressing   Problem: RH BOWEL ELIMINATION Goal: RH STG MANAGE BOWEL WITH ASSISTANCE Description STG Manage Bowel with min Assistance.  Outcome: Progressing   Problem: RH SKIN INTEGRITY Goal: RH STG SKIN FREE OF INFECTION/BREAKDOWN Description Skin free of breakdown and infection with min assist  Outcome: Progressing Goal: RH STG MAINTAIN SKIN INTEGRITY WITH ASSISTANCE Description STG Maintain Skin Integrity With min Assistance.  Outcome: Progressing   Problem: RH SAFETY Goal: RH STG ADHERE TO SAFETY PRECAUTIONS W/ASSISTANCE/DEVICE Description STG Adhere to Safety Precautions With min Assistance/Device.  Outcome: Progressing   Problem: RH COGNITION-NURSING Goal: RH STG USES MEMORY AIDS/STRATEGIES W/ASSIST TO PROBLEM SOLVE Description STG Uses Memory Aids/Strategies With min Assistance to Problem Solve.  Outcome: Progressing Goal: RH STG ANTICIPATES NEEDS/CALLS FOR ASSIST W/ASSIST/CUES Description STG Anticipates Needs/Calls for Assist With min Assistance/Cues.  Outcome: Progressing   Problem: RH SAFETY Goal: RH STG ADHERE TO SAFETY PRECAUTIONS W/ASSISTANCE/DEVICE Description STG Adhere to Safety Precautions With min Assistance/Device.  Outcome: Progressing

## 2018-08-02 NOTE — Progress Notes (Signed)
Physical Therapy Session Note  Patient Details  Name: [name] MRN: [id number] Date of Birth: 07-26-1941  Today's Date: 08/02/2018 PT Individual Time:1512-  - 1612, 60 min individual tx     Short Term Goals: Week 1:  PT Short Term Goal 1 (Week 1): Pt will perform bed mobility with S PT Short Term Goal 2 (Week 1): Pt will perform sit >stand with consistent minA PT Short Term Goal 3 (Week 1): Pt will ambulate 150' with min guard and LRAD PT Short Term Goal 4 (Week 1): Pt will ascend/descend 8 steps with min guard  Skilled Therapeutic Interventions/Progress Updates:   Pt sitting up in bed.  He was resistant to getting OOB, but agreeable to bedside tx.  neuromuscular re-education via forced use, multimodal cues for supine- 2 x 10 each- bil bridging, lower trunk rotation, 1 x 15 modified abdominal crunches, R/L straight leg raises.  In R/L side lying- 15 x 1 each L/R hip abduction with flexed hip and knee.  Rolling L><R in flat bed, no rails supervision, cues.  Supine> sit to R with cues for side lying transitional movement, min assist.  Sit>< squat sitting EOB x 5 with cues.  Stand pivot transfer bed> w/c to L with min assist.  Stand> sit demonstrates poor eccentric control.   Gait training x 42' on level tile with min assist, mod cues for longer L step, upright trunk and keeping LEs within RW.  Pt limited by RUE fatigue.  Up/down 4 ^" high steps, bil rails, min/mod assist, with difficulty with eccentric control.   Pt began getting upset at end of session, due to not wanting to be here, not liking food, and needing to go home.  PT provided emotional support.  Pt without insight as to his balance deficits he had just explained 10 min earlier.  PT able to redirect pt.   Pt left resting in bed with needs at hand and alarm set.    Therapy Documentation Precautions:  Precautions Precautions: Fall Precaution Comments: HD M-W-F Restrictions Weight Bearing Restrictions: No Pain: Pt  denies.    Therapy/Group: Individual Therapy  Rolf Fells 08/02/2018, 12:36 PM

## 2018-08-03 ENCOUNTER — Inpatient Hospital Stay (HOSPITAL_COMMUNITY): Payer: Medicare Other

## 2018-08-03 LAB — CBC
HCT: 34.1 % — ABNORMAL LOW (ref 39.0–52.0)
Hemoglobin: 10.8 g/dL — ABNORMAL LOW (ref 13.0–17.0)
MCH: 30.8 pg (ref 26.0–34.0)
MCHC: 31.7 g/dL (ref 30.0–36.0)
MCV: 97.2 fL (ref 80.0–100.0)
Platelets: 205 10*3/uL (ref 150–400)
RBC: 3.51 MIL/uL — ABNORMAL LOW (ref 4.22–5.81)
RDW: 15.3 % (ref 11.5–15.5)
WBC: 9.9 10*3/uL (ref 4.0–10.5)
nRBC: 0 % (ref 0.0–0.2)

## 2018-08-03 LAB — RENAL FUNCTION PANEL
Albumin: 2.6 g/dL — ABNORMAL LOW (ref 3.5–5.0)
Anion gap: 14 (ref 5–15)
BUN: 53 mg/dL — ABNORMAL HIGH (ref 8–23)
CO2: 24 mmol/L (ref 22–32)
Calcium: 10.1 mg/dL (ref 8.9–10.3)
Chloride: 94 mmol/L — ABNORMAL LOW (ref 98–111)
Creatinine, Ser: 5.97 mg/dL — ABNORMAL HIGH (ref 0.61–1.24)
GFR calc Af Amer: 9 mL/min — ABNORMAL LOW (ref >60)
GFR calc non Af Amer: 8 mL/min — ABNORMAL LOW (ref >60)
Glucose, Bld: 111 mg/dL — ABNORMAL HIGH (ref 70–99)
Phosphorus: 5.8 mg/dL — ABNORMAL HIGH (ref 2.5–4.6)
Potassium: 4 mmol/L (ref 3.5–5.1)
Sodium: 132 mmol/L — ABNORMAL LOW (ref 135–145)

## 2018-08-03 LAB — GLUCOSE, CAPILLARY
Glucose-Capillary: 112 mg/dL — ABNORMAL HIGH (ref 70–99)
Glucose-Capillary: 162 mg/dL — ABNORMAL HIGH (ref 70–99)
Glucose-Capillary: 224 mg/dL — ABNORMAL HIGH (ref 70–99)
Glucose-Capillary: 123 mg/dL — ABNORMAL HIGH (ref 70–99)

## 2018-08-03 MED ORDER — CHLORHEXIDINE GLUCONATE CLOTH 2 % EX PADS: 6.0000 | MEDICATED_PAD | Freq: Every day | CUTANEOUS | Status: DC

## 2018-08-03 MED ORDER — DARBEPOETIN ALFA 60 MCG/0.3ML IJ SOSY
60.0000 ug | PREFILLED_SYRINGE | INTRAMUSCULAR | Status: DC
  Administered 2018-08-04: 60 ug via INTRAVENOUS
  Filled 2018-08-03 (×2): qty 0.3

## 2018-08-03 NOTE — Progress Notes (Signed)
Occupational Therapy Session Note  Patient Details  Name: [name] MRN: [id number] Date of Birth: [birth year]  Today's Date: 08/03/2018 OT Individual Time: 0016-4290 OT Individual Time Calculation (min): 75 min    Short Term Goals: Week 1:  OT Short Term Goal 1 (Week 1): Pt will transfer with min A to and from the BSC/ toilet with LRAD OT Short Term Goal 2 (Week 1): Pt will be able to don shirt from folded position with min A with VC for problem solving OT Short Term Goal 3 (Week 1): Pt will don LB clothing with mod A sit to stand including slide on shoes  OT Short Term Goal 4 (Week 1): Pt will stand to perform 2 grooming tasks at sink with min A; showing improve endurance  Skilled Therapeutic Interventions/Progress Updates:    1;1. Pt received in bed with no c/o pain just lethargic stating, "I didn't have a good night." PT requires increased time to arouse and encouragemetn to participate in tx. Pt supine>sitting EOB with CGA and stand pivot transfer with no AD and min A for boost into standing. OT cues for hand placement throughout transfer. Pt brushes teeth with supervision/VC for locating grooming items on sink. PT doffs socks in seated figure 4, applies lotion to B feet and dons non slip socks after OT dons teds. Pt plays game of giant connect 4 seated in w/c for dynamic reaching outside BOS in mod-max ranges in loud gym environment with max fading to mod cues for strategy and selective attention in crowded gym environment. Exited session with pt seated in w/c, call light in reach and all needs met  Therapy Documentation Precautions:  Precautions Precautions: Fall Precaution Comments: HD M-W-F Restrictions Weight Bearing Restrictions: No  Therapy/Group: Individual Therapy  Tonny Branch 08/03/2018, 9:47 AM

## 2018-08-03 NOTE — Progress Notes (Signed)
[facility] Progress Note   Dialysis Orders: Dialysis Orders:MWF [place] right AVF 4 hrs 180 NRe 400/Autoflow 1.5 71 kg 2.0K/2.0 Ca  R AVF -Heparin 1800 units IV -Mircera 150 mcg IV q 2 weeks (last dose 07/16/18 Last HGB 11.4 07/16/18)  Assessment/Plan: 1. 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   2. ESRD - MWF, HD tomorrow. UF 1.5L.  EDW 71.3 > morning weight 63.9kg . Anemia - hgb 11.5 stable yesterday; give maintenance aranesp 60 tomorrow 4. Secondary hyperparathyroidism - Corr ca has been elevated even prior to hospitalization.  Changed to 2 Ca dialysate  and started sensipar 30 daily; calcitriol d/cd outpt. PTH was 217 10/23, corr ca 11.4 at that time. CTM - it's improved, may need sensipar 60 but follow for now. 5. HTN/volume - Hypotension noted again Friday- resumed pre HD midodrine which worked well.  This AM BP high, normotensive through day yesterday.  CTM and given pre HD midodrine if needed. 6. Nutrition - albumin noted < 3.  Cont supplements and multivit 7. Disp - transferred to CIR 8. Dementia - baseline but  functional prior to acute event. 9. Multiple rib fx - stable 10. DM/hypothyroidism - meds per primary  Jannifer Hick MD [facility] Pager [phone]  Subjective: no complaints this AM  Objective Vitals:   08/03/18 0514 08/03/18 0514 08/03/18 0525 08/03/18 0527  BP: 118/83 118/83 (!) 112/99 (!) 182/93  Pulse: 74 70 72 80  Resp: 16 16    Temp: 97.7 F (36.5 C) 97.7 F (36.5 C)    TempSrc:      SpO2: 100% 100%    Weight:  63.9 kg     Physical Exam General: comfortable flat Heart:RRR Lungs: normal WOB Abdomen: soft, nontender Extremities: no edema Dialysis Access: RUE AV access +thrill   Additional Objective Labs: Basic Metabolic Panel: Recent Labs  Lab 07/30/18 1355 08/01/18 0455 08/03/18 0633  NA 132* 136 132*  K 3.8 4.3 4.0  CL 95* 95* 94*  CO2 23 29  24   GLUCOSE 208* 131* 111*  BUN 53* 49* 53*  CREATININE 6.70* 6.34* 5.97*  CALCIUM 10.2 10.5* 10.1  PHOS 4.7* 5.3* 5.8*   Liver Function Tests: Recent Labs  Lab 07/30/18 1355 08/01/18 0455 08/03/18 0633  ALBUMIN 2.6* 2.7* 2.6*   No results for input(s): LIPASE, AMYLASE in the last 168 hours. CBC: Recent Labs  Lab 07/28/18 0830 07/29/18 1836 07/30/18 1354 08/01/18 0455 08/03/18 0633  WBC 9.1 8.1 9.0 9.9 9.9  HGB 11.4* 12.4* 12.2* 11.5* 10.8*  HCT 34.4* 40.0 37.8* 36.9* 34.1*  MCV 96.1 98.8 97.9 97.9 97.2  PLT 190 188 201 184 205   Blood Culture    Component Value Date/Time   SDES URINE, CATHETERIZED 01/23/2018 2043   [place] NONE 01/23/2018 2043   CULT  01/23/2018 2043    NO GROWTH Performed at [facility] [address] [place], [place] [address]    REPTSTATUS 01/25/2018 FINAL 01/23/2018 2043    Cardiac Enzymes: No results for input(s): CKTOTAL, CKMB, CKMBINDEX, TROPONINI in the last 168 hours. CBG: Recent Labs  Lab 08/01/18 2114 08/02/18 0619 08/02/18 1144 08/02/18 1710 08/02/18 2116  GLUCAP 156* 123* 141* 249* 114*   Iron Studies: No results for input(s): IRON, TIBC, TRANSFERRIN, FERRITIN in the last 72 hours. Lab Results  Component Value Date   INR 0.97 07/22/2018   INR 1.01 06/05/2016   Studies/Results: No results found.  Medications:  . Chlorhexidine Gluconate Cloth  6 each Topical Q0600  . Chlorhexidine Gluconate Cloth  6 each Topical Q0600  . cinacalcet  30 mg Oral Q supper  . docusate sodium  100 mg Oral BID  . donepezil  10 mg Oral QHS  . feeding supplement (NEPRO CARB STEADY)  237 mL Oral BID BM  . feeding supplement (PRO-STAT SUGAR FREE 64)  30 mL Oral BID  . insulin aspart  0-9 Units Subcutaneous TID WC  . levothyroxine  50 mcg Oral QAC breakfast  . liraglutide  0.6 mg Subcutaneous Q0600  . memantine  10 mg Oral BID  . midodrine  10 mg Oral Q M,W,F-HD  . multivitamin  1 tablet Oral QHS  . pantoprazole  40 mg Oral  Daily  . saccharomyces boulardii  250 mg Oral BID  . senna  1 tablet Oral QHS  . sevelamer carbonate  800 mg Oral Q supper  . simvastatin  20 mg Oral QHS  . vitamin B-12  1,000 mcg Oral Daily

## 2018-08-03 NOTE — Progress Notes (Signed)
Physical Therapy Session Note  Patient Details  Name: [name] MRN: [id number] Date of Birth: 08/26/1941  Today's Date: 08/03/2018 PT Individual Time: 0630-1601 PT Individual Time Calculation (min): 56 min   Short Term Goals: Week 1:  PT Short Term Goal 1 (Week 1): Pt will perform bed mobility with S PT Short Term Goal 2 (Week 1): Pt will perform sit >stand with consistent minA PT Short Term Goal 3 (Week 1): Pt will ambulate 150' with min guard and LRAD PT Short Term Goal 4 (Week 1): Pt will ascend/descend 8 steps with min guard  Skilled Therapeutic Interventions/Progress Updates:    Pt supine in bed upon PT arrival, agreeable to therapy tx and denies pain. Pt donned shirt while sitting in bed, min assist. Pt transferred to sitting EOB with supervision and performed sit<>stand with RW and min assist. Pt ambulated x 100 ft with RW and CGA, verbal cues for upright posture and increased step length. Pt performed stand pivot from w/c>mat with min assist and RW. Pt performed x 5 sit<>stands with UE support with supervision and x 5 sit<>stands with arms across chest with min assist, working on LE strengthening. Pt worked on dynamic standing balance reaching for/tossing horseshoes without AD, x 2 trials with min assist for balance. Pt performed sidestepping 2 x 20 ft in each direction using the rail in hallway for UE support working on balance and hip strengthening. Pt ascended/descended 4 steps x 2 this session with B handrails and min assist, step to pattern and verbal cues for techniques. Pt transported back to room and left seated in w/c with wife present.   Therapy Documentation Precautions:  Precautions Precautions: Fall Precaution Comments: HD M-W-F Restrictions Weight Bearing Restrictions: No    Therapy/Group: Individual Therapy  Netta Corrigan, PT, DPT 08/03/2018, 2:24 PM

## 2018-08-03 NOTE — Progress Notes (Signed)
[place] PHYSICAL MEDICINE & REHABILITATION PROGRESS NOTE  Subjective/Complaints:  Pt asking questions "why am I here" no agitation ROS: Unable to assess due to cognition  Objective: Vital Signs: Blood pressure (!) 182/93, pulse 80, temperature 97.7 F (36.5 C), resp. rate 16, weight 63.9 kg, SpO2 100 %. No results found. Recent Labs    08/01/18 0455 08/03/18 0633  WBC 9.9 9.9  HGB 11.5* 10.8*  HCT 36.9* 34.1*  PLT 184 205   Recent Labs    08/01/18 0455  NA 136  K 4.3  CL 95*  CO2 29  GLUCOSE 131*  BUN 49*  CREATININE 6.34*  CALCIUM 10.5*    Physical Exam: BP (!) 182/93 (BP Location: Right Leg)   Pulse 80   Temp 97.7 F (36.5 C)   Resp 16   Wt 63.9 kg   SpO2 100%   BMI 19.65 kg/m  Constitutional: Well-developed. NAD HENT:Bilateral crani incisions with staples. Eyes:EOMI. No discharge.   Cardiovascular:RRR.  No JVD. Respiratory:Effort normal.  Clear.  GI: He exhibitsno distension. Bowel sounds normal.  Musculoskeletal: He exhibits noedema and tenderness. Neurological: Alert and Oriented x 1.  Able to follow simple one step motor commands.  Motor: B/l UE 4/5 proximal to distal B/l LE: HF 3/5, KE 3+/5, ADF 4/5 (?  Effort) HOH Skin: Warm and dry. Intact. Staples C/D/I  Assessment/Plan: 1. 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  Physiatrist is providing close team supervision and 24 hour management of active medical problems listed below.  Physiatrist and rehab team continue to assess barriers to discharge/monitor patient progress toward functional and medical goals  Care Tool:  Bathing    Body parts bathed by patient: Right arm, Left arm, Chest, Abdomen, Face, Front perineal area, Buttocks, Right upper leg, Left upper leg   Body parts bathed by helper: Right lower leg, Left lower leg     Bathing assist Assist Level: Moderate  Assistance - Patient 50 - 74%     Upper Body Dressing/Undressing Upper body dressing   What is the patient wearing?: Button up shirt    Upper body assist Assist Level: Minimal Assistance - Patient > 75%    Lower Body Dressing/Undressing Lower body dressing      What is the patient wearing?: Pants, Incontinence brief     Lower body assist Assist for lower body dressing: Moderate Assistance - Patient 50 - 74%     Toileting Toileting Toileting Activity did not occur (Clothing management and hygiene only): N/A (no void or bm)  Toileting assist Assist for toileting: Moderate Assistance - Patient 50 - 74%     Transfers Chair/bed transfer  Transfers assist     Chair/bed transfer assist level: Minimal Assistance - Patient > 75%     Locomotion Ambulation   Ambulation assist      Assist level: Minimal Assistance - Patient > 75% Assistive device: Walker-rolling Max distance: 42   Walk 10 feet activity   Assist     Assist level: Minimal Assistance - Patient > 75% Assistive device: Walker-rolling   Walk 50 feet activity   Assist    Assist level: Minimal Assistance - Patient > 75%      Walk 150 feet activity   Assist Walk 150 feet activity did not occur: Safety/medical concerns(fatigue)         Walk 10 feet on uneven surface  activity   Assist Walk 10 feet on  uneven surfaces activity did not occur: Safety/medical concerns         Wheelchair     Assist Will patient use wheelchair at discharge?: No      Wheelchair assist level: Minimal Assistance - Patient > 75% Max wheelchair distance: 50    Wheelchair 50 feet with 2 turns activity    Assist        Assist Level: Minimal Assistance - Patient > 75%   Wheelchair 150 feet activity     Assist            Medical Problem List and Plan: 1.Functional and cognitive deficitssecondary to bi-frontal-temporal SDH's status post bur holes on 10/29.   Continue CIR  CT  stable on 11/7, stop seroquel given lethargy2. DVT Prophylaxis/Anticoagulation: Mechanical:Sequential compression devices, below kneeBilateral lower extremities 3. Pain Management:tylenol pron 4. Mood:LCSW to follow for evaluation and support. 5. Neuropsych: This patientis notcapable of making decisions on hisown behalf. 6. Skin/Wound Care:routine pressure relief measures. 7. Fluids/Electrolytes/Nutrition:Monitor I/Os. Labs with HD 8. ESRD: HD MWF at end of the day to help with tolerance of therapy.  9. CAD: Stable  10 T2DM: Monitor BS ac/hs and use SSI for elevated BS.Was on Victoza at home due to multiple hypoglycemic episodes with long acting insulin.  labile on 11/9- on SSI- cont to monitor CBG (last 3)  Recent Labs    08/02/18 1144 08/02/18 1710 08/02/18 2116  GLUCAP 141* 249* 114*   11. Seizure prophylaxis:off  Keppra ,   Lethargy improved off this med 12. H/O IBS: Discontinued laxatives due to reports of incontinence due to diarrhea:  13. Dementia: On Nameda and Aricept. 14. Loose stools. Wife reports history of cycling loose and then constipated stools PTA.   Senna started on 11/7 Added probiotic (?IBS) 15.  Labile blood pressure- check orthostatic proamatine ordered for HD days   Vitals:   08/03/18 0525 08/03/18 0527  BP: (!) 112/99 (!) 182/93  Pulse: 72 80  Resp:    Temp:    SpO2:     16.  Anemia of chronic disease  Hemoglobin 11.5 on 11/8  Continue to monitor   LOS: 5 days A FACE TO FACE EVALUATION WAS PERFORMED  Charlett Blake 08/03/2018, 6:59 AM

## 2018-08-04 ENCOUNTER — Inpatient Hospital Stay (HOSPITAL_COMMUNITY): Payer: Medicare Other | Admitting: Physical Therapy

## 2018-08-04 ENCOUNTER — Inpatient Hospital Stay (HOSPITAL_COMMUNITY): Payer: Medicare Other

## 2018-08-04 ENCOUNTER — Inpatient Hospital Stay (HOSPITAL_COMMUNITY): Payer: Medicare Other | Admitting: Occupational Therapy

## 2018-08-04 DIAGNOSIS — R7309 Other abnormal glucose: Secondary | ICD-10-CM

## 2018-08-04 LAB — RENAL FUNCTION PANEL
Albumin: 2.7 g/dL — ABNORMAL LOW (ref 3.5–5.0)
Anion gap: 16 — ABNORMAL HIGH (ref 5–15)
BUN: 88 mg/dL — ABNORMAL HIGH (ref 8–23)
CO2: 23 mmol/L (ref 22–32)
Calcium: 10 mg/dL (ref 8.9–10.3)
Chloride: 90 mmol/L — ABNORMAL LOW (ref 98–111)
Creatinine, Ser: 8.26 mg/dL — ABNORMAL HIGH (ref 0.61–1.24)
GFR calc Af Amer: 6 mL/min — ABNORMAL LOW (ref >60)
GFR calc non Af Amer: 5 mL/min — ABNORMAL LOW (ref >60)
Glucose, Bld: 141 mg/dL — ABNORMAL HIGH (ref 70–99)
Phosphorus: 5.4 mg/dL — ABNORMAL HIGH (ref 2.5–4.6)
Potassium: 3.8 mmol/L (ref 3.5–5.1)
Sodium: 129 mmol/L — ABNORMAL LOW (ref 135–145)

## 2018-08-04 LAB — CBC
HCT: 33.7 % — ABNORMAL LOW (ref 39.0–52.0)
Hemoglobin: 10.8 g/dL — ABNORMAL LOW (ref 13.0–17.0)
MCH: 30.5 pg (ref 26.0–34.0)
MCHC: 32 g/dL (ref 30.0–36.0)
MCV: 95.2 fL (ref 80.0–100.0)
Platelets: 260 10*3/uL (ref 150–400)
RBC: 3.54 MIL/uL — ABNORMAL LOW (ref 4.22–5.81)
RDW: 15 % (ref 11.5–15.5)
WBC: 17.3 10*3/uL — ABNORMAL HIGH (ref 4.0–10.5)
nRBC: 0 % (ref 0.0–0.2)

## 2018-08-04 LAB — GLUCOSE, CAPILLARY
Glucose-Capillary: 99 mg/dL (ref 70–99)
Glucose-Capillary: 143 mg/dL — ABNORMAL HIGH (ref 70–99)
Glucose-Capillary: 166 mg/dL — ABNORMAL HIGH (ref 70–99)
Glucose-Capillary: 155 mg/dL — ABNORMAL HIGH (ref 70–99)

## 2018-08-04 LAB — OCCULT BLOOD X 1 CARD TO LAB, STOOL
Fecal Occult Bld: NEGATIVE
Fecal Occult Bld: POSITIVE — AB

## 2018-08-04 MED ORDER — SODIUM CHLORIDE 0.9 % IV SOLN: 100.0000 mL | INTRAVENOUS | Status: DC | PRN

## 2018-08-04 MED ORDER — HEPARIN SODIUM (PORCINE) 1000 UNIT/ML DIALYSIS
1000.0000 [IU] | INTRAMUSCULAR | Status: DC | PRN
  Filled 2018-08-04: qty 1

## 2018-08-04 MED ORDER — DARBEPOETIN ALFA 60 MCG/0.3ML IJ SOSY
PREFILLED_SYRINGE | INTRAMUSCULAR | Status: AC
  Administered 2018-08-04: 60 ug via INTRAVENOUS
  Filled 2018-08-04: qty 0.3

## 2018-08-04 MED ORDER — MIDODRINE HCL 5 MG PO TABS
ORAL_TABLET | ORAL | Status: AC
  Filled 2018-08-04: qty 2

## 2018-08-04 NOTE — Progress Notes (Signed)
Speech Language Pathology Daily Session Note  Patient Details  Name: [name] MRN: [id number] Date of Birth: March 31, 1941  Today's Date: 08/04/2018 SLP Individual Time: 9518-8416 SLP Individual Time Calculation (min): 60 min  Short Term Goals: Week 1: SLP Short Term Goal 1 (Week 1): Pt will utilize external aids to recall daily information with min assist verbal cues.   SLP Short Term Goal 1 - Progress (Week 1): Not met SLP Short Term Goal 2 (Week 1): Pt will complete mildly complex tasks with min assist for functional problem solving.   SLP Short Term Goal 2 - Progress (Week 1): Partly met SLP Short Term Goal 3 (Week 1): Pt will recognize and correct errors in the moment during functional tasks with min assist verbal cues.   SLP Short Term Goal 3 - Progress (Week 1): Not met  Skilled Therapeutic Interventions:  Skilled interventions focused on executive functioning, problem solving, and error awareness with functional tasks. Patient required verbal encouragement to participate in today's session. During conversation, pt denied recall and/or problem solving issues and stated "I'm about ready to go home". Pt stated that he assists with financial management however dependent on med management from wife. Pt independently completed mildly complex arithmetic problems in 1/3 trials, given additional time and simplification of directions, and required mod-max A for remaining problems. Pt completed check writing task with 80% accuracy and unable to identify errors independently. Schedule management task completed, however pt required mod-max A to complete accuracy and transfer appointments to correct day/time. Education provided re: importance of wife assistance at home with daily financial/med/schedule management d/t decreased awareness to errors. Pt verbalized understanding. SLP assisted pt to bed with bed alarm set and call bell/necessary items within reach. Continue POC.   Pain Pain  Assessment Pain Score: 0-No pain Faces Pain Scale: Hurts a little bit  Therapy/Group: Individual Therapy  Loni Beckwith, M.S. CCC-SLP Speech-Language Pathologist  Loni Beckwith 08/04/2018, 12:45 PM

## 2018-08-04 NOTE — Progress Notes (Signed)
[facility] PHYSICAL MEDICINE & REHABILITATION PROGRESS NOTE  Subjective/Complaints: Patient seen laying in bed this morning.  He states he slept well overnight.  He states he had a good weekend.  He states he wants to go home.  ROS: Appears to deny CP, shortness of breath, nausea, vomiting, diarrhea.  Objective: Vital Signs: Blood pressure (!) 164/108, pulse 75, temperature 98 F (36.7 C), temperature source Oral, resp. rate 18, weight 64.5 kg, SpO2 99 %. No results found. Recent Labs    08/03/18 0633  WBC 9.9  HGB 10.8*  HCT 34.1*  PLT 205   Recent Labs    08/03/18 0633  NA 132*  K 4.0  CL 94*  CO2 24  GLUCOSE 111*  BUN 53*  CREATININE 5.97*  CALCIUM 10.1    Physical Exam: BP (!) 164/108 (BP Location: Left Leg)   Pulse 75   Temp 98 F (36.7 C) (Oral)   Resp 18   Wt 64.5 kg   SpO2 99%   BMI 19.83 kg/m  Constitutional: Well-developed. NAD HENT:Bilateral crani incisions healing Eyes:EOMI. No discharge.   Cardiovascular:RRR. No JVD. Respiratory:Effort normal. Clear. GI: He exhibitsno distension. Bowel sounds normal.  Musculoskeletal: He exhibits noedema and tenderness. Neurological: Alert and Oriented x 3, except for date.  Able to follow simple one step motor commands.  Motor: B/l UE 4/5 proximal to distal B/l LE: HF 3+/5, KE 4-/5, ADF 4/5 (?  Effort) HOH Skin: Warm and dry. Intact.Incision site C/D/I  Assessment/Plan: 1. 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  Physiatrist is providing close team supervision and 24 hour management of active medical problems listed below.  Physiatrist and rehab team continue to assess barriers to discharge/monitor patient progress toward functional and medical goals  Care Tool:  Bathing    Body parts bathed by patient: Right arm, Left arm, Chest, Abdomen, Face, Front perineal area, Buttocks, Right  upper leg, Left upper leg   Body parts bathed by helper: Right lower leg, Left lower leg     Bathing assist Assist Level: Moderate Assistance - Patient 50 - 74%     Upper Body Dressing/Undressing Upper body dressing   What is the patient wearing?: Button up shirt    Upper body assist Assist Level: Minimal Assistance - Patient > 75%    Lower Body Dressing/Undressing Lower body dressing      What is the patient wearing?: Pants, Incontinence brief     Lower body assist Assist for lower body dressing: Moderate Assistance - Patient 50 - 74%     Toileting Toileting Toileting Activity did not occur (Clothing management and hygiene only): N/A (no void or bm)  Toileting assist Assist for toileting: Moderate Assistance - Patient 50 - 74%     Transfers Chair/bed transfer  Transfers assist     Chair/bed transfer assist level: Minimal Assistance - Patient > 75%     Locomotion Ambulation   Ambulation assist      Assist level: Minimal Assistance - Patient > 75% Assistive device: Walker-rolling Max distance: 100 ft   Walk 10 feet activity   Assist     Assist level: Minimal Assistance - Patient > 75% Assistive device: Walker-rolling   Walk 50 feet activity   Assist    Assist level: Minimal Assistance - Patient > 75% Assistive device: Walker-rolling    Walk 150 feet activity   Assist Walk 150 feet activity did not occur: Safety/medical  concerns(fatigue)  Assist level: Minimal Assistance - Patient > 75% Assistive device: Walker-rolling    Walk 10 feet on uneven surface  activity   Assist Walk 10 feet on uneven surfaces activity did not occur: Safety/medical concerns         Wheelchair     Assist Will patient use wheelchair at discharge?: No      Wheelchair assist level: Minimal Assistance - Patient > 75% Max wheelchair distance: 50    Wheelchair 50 feet with 2 turns activity    Assist        Assist Level: Minimal Assistance -  Patient > 75%   Wheelchair 150 feet activity     Assist            Medical Problem List and Plan: 1.Functional and cognitive deficitssecondary to bi-frontal-temporal SDH's status post bur holes on 10/29.   Continue CIR  CT reviewed, stable on 11/7  Weekend notes reviewed, discussed with physician 2. DVT Prophylaxis/Anticoagulation: Mechanical:Sequential compression devices, below kneeBilateral lower extremities 3. Pain Management:tylenol pron 4. Mood:LCSW to follow for evaluation and support. 5. Neuropsych: This patientis notcapable of making decisions on hisown behalf. 6. Skin/Wound Care:routine pressure relief measures. 7. Fluids/Electrolytes/Nutrition:Monitor I/Os. Labs with HD 8. ESRD: HD MWF at end of the day to help with tolerance of therapy.  9. CAD: Stable  10 T2DM: Monitor BS ac/hs and use SSI for elevated BS.Was on Victoza at home due to multiple hypoglycemic episodes with long acting insulin.  Labile on 11/11  Cont SSI CBG (last 3)  Recent Labs    08/03/18 1653 08/03/18 2107 08/04/18 0626  GLUCAP 224* 123* 99   11. Seizure prophylaxis:off  Keppra  12. H/O IBS: Discontinued laxatives due to reports of incontinence due to diarrhea:  13. Dementia: On Nameda and Aricept. 14. Loose stools. Wife reports history of cycling loose and then constipated stools PTA.   Senna started on 11/7 Added probiotic   Improving overall 15.  Labile blood pressure- proamatine ordered for HD days   Vitals:   08/04/18 0349 08/04/18 0351  BP: 116/74 (!) 164/108  Pulse: 70 75  Resp: 18   Temp: 98 F (36.7 C)   SpO2: 99%    Extremely labile on 11/11 16.  Anemia of chronic disease  Hemoglobin 10.8 on 11/10  Continue to monitor   LOS: 6 days A FACE TO FACE EVALUATION WAS PERFORMED   [name] 08/04/2018, 8:48 AM

## 2018-08-04 NOTE — Progress Notes (Signed)
Occupational Therapy Session Note  Patient Details  Name: [name] MRN: [id number] Date of Birth: Jun 22, 1941  Today's Date: 08/04/2018 OT Individual Time: 0800-0900 OT Individual Time Calculation (min): 60 min    Short Term Goals: Week 1:  OT Short Term Goal 1 (Week 1): Pt will transfer with min A to and from the BSC/ toilet with LRAD OT Short Term Goal 2 (Week 1): Pt will be able to don shirt from folded position with min A with VC for problem solving OT Short Term Goal 3 (Week 1): Pt will don LB clothing with mod A sit to stand including slide on shoes  OT Short Term Goal 4 (Week 1): Pt will stand to perform 2 grooming tasks at sink with min A; showing improve endurance  Skilled Therapeutic Interventions/Progress Updates:    Pt presents supine in bed with no c/o pain, agreeable to OT tx session.Pt requesting to perform self-care tasks at end of session vs beginning. Transitioned to sitting EOB with S. Performs sit<>stand and ambulates approx 5' to w/c using RW with minA throughout. Transported pt va w/c to therapy gym for energy conservation, stand pivot w/c>mat table without AD and minA. Pt engaged in standing activity with focus on standing balance and endurance. Pt completing x3 rounds of horseshoes in standing using single UE support and completing with CGA for sit<>stand and standing balance, reaching outside BOS to obtain horseshoes, taking seated rest break between each round. Pt reporting increased pain in low back after activity, requesting to take rest break in supine and reporting pain subsides with rest. Transitioned back to w/c and room in manner described before for completion of dressing ADLs. Pt requiring CGA for balance during standing portions of LB dressing, requiring increased time and cues to utilize figure 4 technique to decrease back pain with task completion. Noted pt to have incontinent BM during LB dressing, requiring totalA for peri-care this session. Discussion  held with pt re: working towards completing peri-care on his own in preparation for return home. Pt ambulated room level distance to return to EOB end of session with CGA using RW. Pt left supine in bed end of session with bed alarm set, RN present and needs met.   Therapy Documentation Precautions:  Precautions Precautions: Fall Precaution Comments: HD M-W-F Restrictions Weight Bearing Restrictions: No       Therapy/Group: Individual Therapy  Raymondo Band 08/04/2018, 8:18 AM

## 2018-08-04 NOTE — Progress Notes (Addendum)
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 Bilat SDH sp hematoma evacuation - on CIR now 2. ESRD - MWF. HD today.  3. Anemia - hgb 11.5 stable; give maintenance aranesp 60 today 4. Secondary hyperparathyroidism - Corr ca has been elevated even prior to hospitalization.  Changed to 2 Ca dialysate  and started sensipar 30 daily; calcitriol d/c'd.  PTH was 217 10/23, corr ca 11.4 at that time 5. HTN/volume - on midodrine pre HD. Sig wt loss, lower edw at dc 6. Nutrition - albumin noted < 3.  Cont supplements and multivit 7. Dementia - baseline but  functional prior to acute event. 8. Multiple rib fx - stable 9. DM/hypothyroidism - meds per primary  Kelly Splinter MD [facility] pgr [phone]   08/04/2018, 10:46 AM    Subjective: no complaints this AM  Objective Vitals:   08/03/18 1934 08/04/18 0349 08/04/18 0351 08/04/18 0357  BP: 113/68 116/74 (!) 164/108   Pulse: 72 70 75   Resp: 16 18    Temp: 98.7 F (37.1 C) 98 F (36.7 C)    TempSrc:  Oral    SpO2:  99%    Weight:    64.5 kg   Physical Exam General: comfortable flat Heart:RRR Lungs: normal WOB Abdomen: soft, nontender Extremities: no edema Dialysis Access: RUE AV access +thrill   Additional Objective Labs: Basic Metabolic Panel: Recent Labs  Lab 07/30/18 1355 08/01/18 0455 08/03/18 0633  NA 132* 136 132*  K 3.8 4.3 4.0  CL 95* 95* 94*  CO2 23 29 24   GLUCOSE 208* 131* 111*  BUN 53* 49* 53*  CREATININE 6.70* 6.34* 5.97*  CALCIUM 10.2 10.5* 10.1  PHOS 4.7* 5.3* 5.8*   Liver Function Tests: Recent Labs  Lab 07/30/18 1355 08/01/18 0455 08/03/18 0633  ALBUMIN 2.6* 2.7* 2.6*   No results for input(s): LIPASE, AMYLASE in the last 168 hours. CBC: Recent Labs  Lab 07/29/18 1836 07/30/18 1354  08/01/18 0455 08/03/18 0633  WBC 8.1 9.0 9.9 9.9  HGB 12.4* 12.2* 11.5* 10.8*  HCT 40.0 37.8* 36.9* 34.1*  MCV 98.8 97.9 97.9 97.2  PLT 188 201 184 205   Blood Culture    Component Value Date/Time   SDES URINE, CATHETERIZED 01/23/2018 2043   [place] NONE 01/23/2018 2043   CULT  01/23/2018 2043    NO GROWTH Performed at [facility] [address], [place], [place] [address]    REPTSTATUS 01/25/2018 FINAL 01/23/2018 2043    Cardiac Enzymes: No results for input(s): CKTOTAL, CKMB, CKMBINDEX, TROPONINI in the last 168 hours. CBG: Recent Labs  Lab 08/03/18 0635 08/03/18 1154 08/03/18 1653 08/03/18 2107 08/04/18 0626  GLUCAP 112* 162* 224* 123* 99   Iron Studies: No results for input(s): IRON, TIBC, TRANSFERRIN, FERRITIN in the last 72 hours. Lab Results  Component Value Date   INR 0.97 07/22/2018   INR 1.01 06/05/2016   Studies/Results: No results found. Medications:  . Chlorhexidine Gluconate Cloth  6 each Topical Q0600  . Chlorhexidine Gluconate Cloth  6 each Topical Q0600  . Chlorhexidine Gluconate Cloth  6 each Topical Q0600  . cinacalcet  30 mg Oral Q supper  . darbepoetin (ARANESP) injection - DIALYSIS  60 mcg Intravenous Q Mon-HD  . docusate sodium  100 mg Oral BID  . donepezil  10 mg Oral QHS  . feeding supplement (NEPRO CARB STEADY)  237 mL Oral BID BM  . feeding supplement (PRO-STAT SUGAR FREE 64)  30 mL Oral BID  . insulin aspart  0-9 Units Subcutaneous TID WC  . levothyroxine  50 mcg Oral QAC breakfast  . liraglutide  0.6 mg Subcutaneous Q0600  . memantine  10 mg Oral BID  . midodrine  10 mg Oral Q M,W,F-HD  . multivitamin  1 tablet Oral QHS  . pantoprazole  40 mg Oral Daily  . saccharomyces boulardii  250 mg Oral BID  . senna  1 tablet Oral QHS  . sevelamer carbonate  800 mg Oral Q supper  . simvastatin  20 mg Oral QHS  . vitamin B-12  1,000 mcg Oral Daily

## 2018-08-04 NOTE — Progress Notes (Signed)
Physical Therapy Session Note  Patient Details  Name: [name] MRN: [id number] Date of Birth: July 13, 1941  Today's Date: 08/04/2018 PT Individual Time: 1000-1100 PT Individual Time Calculation (min): 60 min   Short Term Goals: Week 1:  PT Short Term Goal 1 (Week 1): Pt will perform bed mobility with S PT Short Term Goal 2 (Week 1): Pt will perform sit >stand with consistent minA PT Short Term Goal 3 (Week 1): Pt will ambulate 150' with min guard and LRAD PT Short Term Goal 4 (Week 1): Pt will ascend/descend 8 steps with min guard  Skilled Therapeutic Interventions/Progress Updates: Pt received supine in bed, denies pain and agreeable to treatment. Supine>sit with S and increased time to initiate. Gait trial x75' with RW and min guard; mod cues for upright posture and maintain RW closer to body, increase step length. Ascent/descent steps 6" height x8 steps with B handrails and min guard, step-to pattern. Standing gastroc stretch on bottom step x1 min, heel raises x10 reps; 2 sets with seated rest break between d/t fatigue. Sit <>stand x5 reps with min guard for LE strengthening. Pt requests to lay down and rest d/t back pain. Supine hip flexor stretch off mat with contralateral LE in hip/knee flexion to stabilize pelvis; light overpressure at distal LE into knee flexion for quad stretch. Supine hooklying trunk rotation x10 reps each side for lumbar ROM and pain control. Bridging 2x10 reps. Hamstring stretch x1 min each LE. Standing tolerance, postural retraining with overhead reaching/matching cards to facilitate upright posture; fatigues quickly with task. Gait x30' with RW and min guard, again mod cues for safety and RW management. Stand pivot minA no AD to return to bed. Sit >supine with S. Remained in bed, alarm intact and all needs in reach at end of session.      Therapy Documentation Precautions:  Precautions Precautions: Fall Precaution Comments: HD M-W-F Restrictions Weight  Bearing Restrictions: No    Therapy/Group: Individual Therapy  Corliss Skains 08/04/2018, 11:00 AM

## 2018-08-05 ENCOUNTER — Inpatient Hospital Stay (HOSPITAL_COMMUNITY): Payer: Medicare Other | Admitting: Speech Pathology

## 2018-08-05 ENCOUNTER — Inpatient Hospital Stay (HOSPITAL_COMMUNITY): Payer: Medicare Other | Admitting: Physical Therapy

## 2018-08-05 LAB — OCCULT BLOOD X 1 CARD TO LAB, STOOL: Fecal Occult Bld: POSITIVE — AB

## 2018-08-05 LAB — GLUCOSE, CAPILLARY
Glucose-Capillary: 123 mg/dL — ABNORMAL HIGH (ref 70–99)
Glucose-Capillary: 136 mg/dL — ABNORMAL HIGH (ref 70–99)
Glucose-Capillary: 92 mg/dL (ref 70–99)
Glucose-Capillary: 106 mg/dL — ABNORMAL HIGH (ref 70–99)

## 2018-08-05 MED ORDER — CHLORHEXIDINE GLUCONATE CLOTH 2 % EX PADS
6.0000 | MEDICATED_PAD | Freq: Every day | CUTANEOUS | Status: DC
  Administered 2018-08-06: 6 via TOPICAL

## 2018-08-05 NOTE — Progress Notes (Signed)
Physical Therapy Session Note  Patient Details  Name: [name] MRN: [id number] Date of Birth: [birth year]  Today's Date: 08/05/2018 PT Individual Time: 0930-1000 PT Individual Time Calculation (min): 30 min   Short Term Goals: Week 1:  PT Short Term Goal 1 (Week 1): Pt will perform bed mobility with S PT Short Term Goal 2 (Week 1): Pt will perform sit >stand with consistent minA PT Short Term Goal 3 (Week 1): Pt will ambulate 150' with min guard and LRAD PT Short Term Goal 4 (Week 1): Pt will ascend/descend 8 steps with min guard  Skilled Therapeutic Interventions/Progress Updates:   Pt in supine and agreeable to therapy w/ max encouragement, no c/o pain. Transferred to EOB w/ supervision and increased time. Maintained static sitting w/ supervision while eating breakfast. Pt repeatedly asking this therapist to do things for him on his lunch tray, max encouragement to perform tasks himself as he is able to do so. Pt asking why he is still in the hospital and states he can do things on his own. Max cues from therapist and RN to orient to his functional status and remember having surgery. Donned pants w/ min assist at EOB, pt continued to state that he didn't need help for functional tasks. Transferred to w/c via stand pivot w/ RW and CGA/close supervision. Ended session in w/c and in care of RN, all needs met.   Therapy Documentation Precautions:  Precautions Precautions: Fall Precaution Comments: HD M-W-F Restrictions Weight Bearing Restrictions: No Vital Signs: Therapy Vitals Temp: 97.8 F (36.6 C) Temp Source: Oral Pulse Rate: 67 Resp: 15 BP: (!) 123/45 Patient Position (if appropriate): Lying Oxygen Therapy SpO2: 97 % O2 Device: Room Air  Therapy/Group: Individual Therapy  Norleen Xie K Velmer Woelfel 08/05/2018, 10:02 AM

## 2018-08-05 NOTE — Progress Notes (Signed)
Physical Therapy Session Note  Patient Details  Name: [name] MRN: [id number] Date of Birth: Aug 28, 1941  Today's Date: 08/05/2018 PT Individual Time: 1000-1100 PT Individual Time Calculation (min): 60 min   Short Term Goals: Week 1:  PT Short Term Goal 1 (Week 1): Pt will perform bed mobility with S PT Short Term Goal 2 (Week 1): Pt will perform sit >stand with consistent minA PT Short Term Goal 3 (Week 1): Pt will ambulate 150' with min guard and LRAD PT Short Term Goal 4 (Week 1): Pt will ascend/descend 8 steps with min guard  Skilled Therapeutic Interventions/Progress Updates:    Pt received seated in w/c with nurse present. Administrating meds and cleaned pt as he had a bowel movement in his brief. CGA sit>stand with RW and verbal cues for sequence. S standing balance; assistance provided for hygiene and clothing management. S w/c propulsion to/from rehab gym for endurance with VCs for hand placement for efficient stroke pattern. CGA squat pivot transfer to/from mat table. Dynamic standing balance with beanbag tosses; frequent seated rest breaks due to complaint of pain in R lower back. STM to paraspinals, quadratus lumborum, and glutes for pain relief; palpable tautness. Supine bridges 3x10 with 3 seconds hold for glute strengthening. Pt remained seated in w/c with lap alarm intact and all needs in reach at the end of the session.   Therapy Documentation Precautions:  Precautions Precautions: Fall Precaution Comments: HD M-W-F Restrictions Weight Bearing Restrictions: No Pain:      Therapy/Group: Individual Therapy  [place] Marshia Tropea 08/05/2018, 10:57 AM

## 2018-08-05 NOTE — Progress Notes (Signed)
Speech Language Pathology Daily Session Note  Patient Details  Name: [name] MRN: [id number] Date of Birth: 1941/06/16  Today's Date: 08/05/2018 SLP Individual Time: 1120-1200 SLP Individual Time Calculation (min): 40 min  Short Term Goals: Week 1: SLP Short Term Goal 1 (Week 1): Pt will utilize external aids to recall daily information with min assist verbal cues.   SLP Short Term Goal 1 - Progress (Week 1): Not met SLP Short Term Goal 2 (Week 1): Pt will complete mildly complex tasks with min assist for functional problem solving.   SLP Short Term Goal 2 - Progress (Week 1): Partly met SLP Short Term Goal 3 (Week 1): Pt will recognize and correct errors in the moment during functional tasks with min assist verbal cues.   SLP Short Term Goal 3 - Progress (Week 1): Not met  Skilled Therapeutic Interventions:  Pt was seen for skilled ST targeting cognitive goals.  Pt was irritable upon therapist's arrival.  He was wondering why his bed hadn't been made and he wanted to go back to bed.  RN reports that pt's bed was intentionally left unmade given that he had only been up for a short period of time before asking to return to bed and staff is wanting to encourage pt to stay up for longer periods of time during the day.  Pt was agreeable to participating in therapies after therapist promised that she would transfer him to the recliner after session.  Pt required max assist to complete a basic verbal map navigation task due to decreased working memory for verbal instructions.  Therapist shared her observation with pt who was agreeable to working on his memory further in future therapy sessions.  Pt was returned to room and left in recliner with chair alarm set and call bell within reach.  Continue per current plan of care.    Pain Pain Assessment Pain Scale: 0-10 Pain Score: 0-No pain  Therapy/Group: Individual Therapy  Gianelle Mccaul, Selinda Orion 08/05/2018, 12:44 PM

## 2018-08-05 NOTE — Progress Notes (Signed)
[place] PHYSICAL MEDICINE & REHABILITATION PROGRESS NOTE  Subjective/Complaints: Pt seen laying in bed this AM.  He states he slept well overnight, confirmed by sleep chart.    ROS: Appears to deny CP, shortness of breath, nausea, vomiting, diarrhea.  Objective: Vital Signs: Blood pressure (!) 123/45, pulse 67, temperature 97.8 F (36.6 C), temperature source Oral, resp. rate 15, weight 63.5 kg, SpO2 97 %. No results found. Recent Labs    08/03/18 0633 08/04/18 1357  WBC 9.9 17.3*  HGB 10.8* 10.8*  HCT 34.1* 33.7*  PLT 205 260   Recent Labs    08/03/18 0633 08/04/18 1357  NA 132* 129*  K 4.0 3.8  CL 94* 90*  CO2 24 23  GLUCOSE 111* 141*  BUN 53* 88*  CREATININE 5.97* 8.26*  CALCIUM 10.1 10.0    Physical Exam: BP (!) 123/45 (BP Location: Left Leg)   Pulse 67   Temp 97.8 F (36.6 C) (Oral)   Resp 15   Wt 63.5 kg   SpO2 97%   BMI 19.51 kg/m  Constitutional: Well-developed. NAD HENT:Bilateral crani incisions healing Eyes:EOMI. No discharge.   Cardiovascular:RRR. No JVD. Respiratory:Effort normal. Clear. GI: He exhibitsno distension. Bowel sounds normal.  Musculoskeletal: He exhibits noedema and tenderness. Neurological: Alert and Oriented x 1 (fluctuated).  Able to follow simple one step motor commands.  Motor: B/l UE 4/5 proximal to distal B/l LE: HF 4+/5, KE 4+/5, ADF 5/5  HOH Skin: Warm and dry. Intact.Incision site C/D/I  Assessment/Plan: 1. 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  Physiatrist is providing close team supervision and 24 hour management of active medical problems listed below.  Physiatrist and rehab team continue to assess barriers to discharge/monitor patient progress toward functional and medical goals  Care Tool:  Bathing    Body parts bathed by patient: Right arm, Left arm, Chest, Abdomen, Face, Front  perineal area, Buttocks, Right upper leg, Left upper leg   Body parts bathed by helper: Right lower leg, Left lower leg     Bathing assist Assist Level: Moderate Assistance - Patient 50 - 74%     Upper Body Dressing/Undressing Upper body dressing   What is the patient wearing?: Button up shirt    Upper body assist Assist Level: Minimal Assistance - Patient > 75%    Lower Body Dressing/Undressing Lower body dressing      What is the patient wearing?: Pants, Incontinence brief     Lower body assist Assist for lower body dressing: Minimal Assistance - Patient > 75%     Toileting Toileting Toileting Activity did not occur (Clothing management and hygiene only): N/A (no void or bm)  Toileting assist Assist for toileting: Moderate Assistance - Patient 50 - 74%     Transfers Chair/bed transfer  Transfers assist     Chair/bed transfer assist level: Minimal Assistance - Patient > 75%     Locomotion Ambulation   Ambulation assist      Assist level: Minimal Assistance - Patient > 75% Assistive device: Walker-rolling Max distance: 75   Walk 10 feet activity   Assist     Assist level: Minimal Assistance - Patient > 75% Assistive device: Walker-rolling   Walk 50 feet activity   Assist    Assist level: Minimal Assistance - Patient > 75% Assistive device: Walker-rolling    Walk 150 feet activity   Assist Walk 150 feet activity did not occur: Safety/medical  concerns(fatigue)  Assist level: Minimal Assistance - Patient > 75% Assistive device: Walker-rolling    Walk 10 feet on uneven surface  activity   Assist Walk 10 feet on uneven surfaces activity did not occur: Safety/medical concerns         Wheelchair     Assist Will patient use wheelchair at discharge?: No      Wheelchair assist level: Minimal Assistance - Patient > 75% Max wheelchair distance: 50    Wheelchair 50 feet with 2 turns activity    Assist        Assist  Level: Minimal Assistance - Patient > 75%   Wheelchair 150 feet activity     Assist            Medical Problem List and Plan: 1.Functional and cognitive deficitssecondary to bi-frontal-temporal SDH's status post bur holes on 10/29.   Continue CIR  CT reviewed, stable on 11/7 2. DVT Prophylaxis/Anticoagulation: Mechanical:Sequential compression devices, below kneeBilateral lower extremities 3. Pain Management:tylenol pron 4. Mood:LCSW to follow for evaluation and support. 5. Neuropsych: This patientis notcapable of making decisions on hisown behalf. 6. Skin/Wound Care:routine pressure relief measures. 7. Fluids/Electrolytes/Nutrition:Monitor I/Os. Labs with HD 8. ESRD: HD MWF at end of the day to help with tolerance of therapy.  9. CAD: Stable  10 T2DM: Monitor BS ac/hs and use SSI for elevated BS.Was on Victoza at home due to multiple hypoglycemic episodes with long acting insulin.  Labile, but improving on 11/12  Cont SSI CBG (last 3)  Recent Labs    08/04/18 1734 08/04/18 2107 08/05/18 0659  GLUCAP 166* 155* 123*   11. Seizure prophylaxis:off  Keppra  12. H/O IBS: Discontinued laxatives due to reports of incontinence due to diarrhea:  13. Dementia: On Nameda and Aricept. 14. Loose stools. Wife reports history of cycling loose and then constipated stools PTA.   Senna started on 11/7 Added probiotic   Improving overall 15.  Labile blood pressure- proamatine ordered for HD days   Vitals:   08/04/18 2130 08/05/18 0652  BP: 119/64 (!) 123/45  Pulse: 74 67  Resp: 12 15  Temp: 98.1 F (36.7 C) 97.8 F (36.6 C)  SpO2: 99% 97%   Extremely labile on 11/11 16.  Anemia of chronic disease  Hemoglobin 10.8 on 11/11  Continue to monitor 17.  Leukocytosis  WBCs 17.3 on 11/11  Afebrile  Continue to monitor  LOS: 7 days A FACE TO FACE EVALUATION WAS PERFORMED  Ankit [name] 08/05/2018, 8:04 AM

## 2018-08-06 ENCOUNTER — Inpatient Hospital Stay (HOSPITAL_COMMUNITY): Payer: Medicare Other | Admitting: Speech Pathology

## 2018-08-06 ENCOUNTER — Inpatient Hospital Stay (HOSPITAL_COMMUNITY): Payer: Medicare Other | Admitting: Occupational Therapy

## 2018-08-06 ENCOUNTER — Inpatient Hospital Stay (HOSPITAL_COMMUNITY): Payer: Medicare Other

## 2018-08-06 ENCOUNTER — Other Ambulatory Visit: Payer: Self-pay

## 2018-08-06 ENCOUNTER — Inpatient Hospital Stay (HOSPITAL_COMMUNITY): Payer: Medicare Other | Admitting: Physical Therapy

## 2018-08-06 LAB — CBC
HCT: 30.5 % — ABNORMAL LOW (ref 39.0–52.0)
Hemoglobin: 10.1 g/dL — ABNORMAL LOW (ref 13.0–17.0)
MCH: 31.9 pg (ref 26.0–34.0)
MCHC: 33.1 g/dL (ref 30.0–36.0)
MCV: 96.2 fL (ref 80.0–100.0)
Platelets: 218 10*3/uL (ref 150–400)
RBC: 3.17 MIL/uL — ABNORMAL LOW (ref 4.22–5.81)
RDW: 15.2 % (ref 11.5–15.5)
WBC: 8.8 10*3/uL (ref 4.0–10.5)
nRBC: 0 % (ref 0.0–0.2)

## 2018-08-06 LAB — GLUCOSE, CAPILLARY
Glucose-Capillary: 87 mg/dL (ref 70–99)
Glucose-Capillary: 133 mg/dL — ABNORMAL HIGH (ref 70–99)
Glucose-Capillary: 124 mg/dL — ABNORMAL HIGH (ref 70–99)
Glucose-Capillary: 114 mg/dL — ABNORMAL HIGH (ref 70–99)

## 2018-08-06 LAB — RENAL FUNCTION PANEL
Albumin: 2.5 g/dL — ABNORMAL LOW (ref 3.5–5.0)
Anion gap: 14 (ref 5–15)
BUN: 53 mg/dL — ABNORMAL HIGH (ref 8–23)
CO2: 23 mmol/L (ref 22–32)
Calcium: 9.1 mg/dL (ref 8.9–10.3)
Chloride: 95 mmol/L — ABNORMAL LOW (ref 98–111)
Creatinine, Ser: 5.42 mg/dL — ABNORMAL HIGH (ref 0.61–1.24)
GFR calc Af Amer: 11 mL/min — ABNORMAL LOW (ref >60)
GFR calc non Af Amer: 9 mL/min — ABNORMAL LOW (ref >60)
Glucose, Bld: 155 mg/dL — ABNORMAL HIGH (ref 70–99)
Phosphorus: 4 mg/dL (ref 2.5–4.6)
Potassium: 3.5 mmol/L (ref 3.5–5.1)
Sodium: 132 mmol/L — ABNORMAL LOW (ref 135–145)

## 2018-08-06 MED ORDER — LIRAGLUTIDE 18 MG/3ML ~~LOC~~ SOPN: 0.6000 mg | PEN_INJECTOR | Freq: Every day | SUBCUTANEOUS | 1 refills | Status: DC

## 2018-08-06 MED ORDER — MEMANTINE HCL 10 MG PO TABS: 10.0000 mg | ORAL_TABLET | Freq: Two times a day (BID) | ORAL | 0 refills | Status: DC

## 2018-08-06 MED ORDER — ACETAMINOPHEN 325 MG PO TABS
ORAL_TABLET | ORAL | Status: AC
  Administered 2018-08-06: 650 mg via ORAL
  Filled 2018-08-06: qty 2

## 2018-08-06 NOTE — Progress Notes (Signed)
Occupational Therapy Session Note  Patient Details  Name: [name] MRN: [id number] Date of Birth: Jun 30, 1941  Today's Date: 08/06/2018 OT Individual Time: 0700-0730 OT Individual Time Calculation (min): 30 min    Short Term Goals: Week 1:  OT Short Term Goal 1 (Week 1): Pt will transfer with min A to and from the BSC/ toilet with LRAD OT Short Term Goal 2 (Week 1): Pt will be able to don shirt from folded position with min A with VC for problem solving OT Short Term Goal 3 (Week 1): Pt will don LB clothing with mod A sit to stand including slide on shoes  OT Short Term Goal 4 (Week 1): Pt will stand to perform 2 grooming tasks at sink with min A; showing improve endurance  Skilled Therapeutic Interventions/Progress Updates:    Pt resting in bed upon arrival with breakfast tray on bedside table.  Pt initially declined eating breakfast, stating that the food "tasted like shit." Pt agreed to eating breakfast with max encouragement but refused to transfer to w/c or recliner.  Pt required assistance cutting food and opening containers.  Pt required max verbal cues to redirect to task and encouraged to not carry on conversation while eating.  Pt remained in bed with bed alarm activated.  RN notified that pt was eating his breakfast.  Therapy Documentation Precautions:  Precautions Precautions: Fall Precaution Comments: HD M-W-F Restrictions Weight Bearing Restrictions: No Pain:  Pt denies pain   Therapy/Group: Individual Therapy  Leroy Libman 08/06/2018, 7:36 AM

## 2018-08-06 NOTE — Progress Notes (Signed)
Occupational Therapy Session Note  Patient Details  Name: [name] MRN: [id number] Date of Birth: 01-24-1941  Today's Date: 08/06/2018 OT Individual Time: 0800-0905 OT Individual Time Calculation (min): 65 min    Short Term Goals: Week 1:  OT Short Term Goal 1 (Week 1): Pt will transfer with min A to and from the BSC/ toilet with LRAD OT Short Term Goal 2 (Week 1): Pt will be able to don shirt from folded position with min A with VC for problem solving OT Short Term Goal 3 (Week 1): Pt will don LB clothing with mod A sit to stand including slide on shoes  OT Short Term Goal 4 (Week 1): Pt will stand to perform 2 grooming tasks at sink with min A; showing improve endurance  Skilled Therapeutic Interventions/Progress Updates:    Patient resting in bed upon arrival;  States "I am going home on Tuesday", denies pain Confusion noted but able to follow directions and sequence tasks for mobility, toileting & shower with cues for pace and attention to detail. ADL:  Bathing - mod A (assist to wash back, bottom, and lower legs)     toileting - CG for clothing mgmt, Dep for hygiene   UB dressing - min A   LB dressing - mod A Functional transfers/mobility - bed mobility with CS, SPT with RW to/from bed, toilet, shower bench CG to steady, able to ambulate w/ RW to bathroom from bed with CG and cues for walker placement. Patient tolerated session well and was pleasant t/o,  returned to bed at close of session with alarm set.    Therapy Documentation Precautions:  Precautions Precautions: Fall Precaution Comments: HD M-W-F Restrictions Weight Bearing Restrictions: No General:   Vital Signs:   Pain: Pain Assessment Pain Scale: 0-10 Pain Score: 0-No pain   Therapy/Group: Individual Therapy  Carlos Levering 08/06/2018, 10:18 AM

## 2018-08-06 NOTE — Progress Notes (Signed)
Speech Language Pathology Weekly Progress and Session Note  Patient Details  Name: [name] MRN: [id number] Date of Birth: Apr 04, 1941  Beginning of progress report period: July 30, 2018 End of progress report period: August 06, 2018  Today's Date: 08/06/2018 SLP Individual Time: 1030-1100 SLP Individual Time Calculation (min): 30 min  Short Term Goals: Week 1: SLP Short Term Goal 1 (Week 1): Pt will utilize external aids to recall daily information with min assist verbal cues.   SLP Short Term Goal 1 - Progress (Week 1): Not met SLP Short Term Goal 2 (Week 1): Pt will complete mildly complex tasks with min assist for functional problem solving.   SLP Short Term Goal 2 - Progress (Week 1): Not met SLP Short Term Goal 3 (Week 1): Pt will recognize and correct errors in the moment during functional tasks with min assist verbal cues.   SLP Short Term Goal 3 - Progress (Week 1): Not met    New Short Term Goals: Week 2: SLP Short Term Goal 1 (Week 2): Pt will utilize external aids to recall daily information with min assist verbal cues.   SLP Short Term Goal 2 (Week 2): Pt will complete mildly complex tasks with min assist for functional problem solving.   SLP Short Term Goal 3 (Week 2): Pt will recognize and correct errors in the moment during functional tasks with min assist verbal cues.    Weekly Progress Updates: Patient has made minimal progress and has not med any STGs this reporting period. Currently, patient requires overall Mod A multimodal cues for recall with use of strategies, functional problem solving with mildly complex tasks and for ability self-monitor and correct errors. Patient also requires intermittent encouragement to participate in therapy sessions due to impaired awareness of cognitive impairments and fatigue. Patient and family education ongoing. Patient would benefit from continued skilled SLP intervention to maximize his cognitive functioning and overall  functional independence prior to discharge.      Intensity: Minumum of 1-2 x/day, 30 to 90 minutes Frequency: 3 to 5 out of 7 days Duration/Length of Stay: anticipated d/c date of 11/20 Treatment/Interventions: Cognitive remediation/compensation;Cueing hierarchy;Environmental controls;Internal/external aids;Functional tasks;Patient/family education;Therapeutic Activities   Daily Session  Skilled Therapeutic Interventions:  Skilled treatment session focused on cognitive goals. SLP facilitated session by providing Supervision verbal cues for recall of events from previous therapy appointments. Patient also independently able to locate his schedule in order to anticipate upcoming appointment times. Patient appeared fatigued throughout session but was participatory. When discussing home, patient reported his wife helps with medication administration but patient was insistent on his ability to recall his current medications and their function, however, patient required overall total A. Patient left upright in bed with alarm on and all needs within reach. Continue with current plan of care.     Pain No/Denies Pain   Therapy/Group: Individual Therapy  Osmani Kersten 08/06/2018, 3:31 PM

## 2018-08-06 NOTE — Progress Notes (Addendum)
Social Work Patient ID: [name], male   DOB: [birth year], 78 y.o.   MRN: [id number] Met with pt and wife to discuss team conference progress and discharge date 11/20. Wife was wondering if she can't provide the care he needs if he can stay here longer. Discussed he would need to go to the next level of care-[facility]. She voiced he will not agree to this.Disucssed team recommends 24 hr supervision for safety. Wife reports he was approved through the [place] for OT & PT at home. Will find out which agency that was suppose to start. Wife to check into aide and attendant services for pt at home which would give him an aide 2-3 hours per day. She plans to take him home and see how they manage and if doesn't do well will go through [place] services. Will work on discharge needs. Wife has seen in therapies but declines doing hands on care.

## 2018-08-06 NOTE — Progress Notes (Signed)
Pharmacy to order replacement victoza pen at no cost to patient since nursing staff accidentally tossed patients home supply in sharps.  Brita Romp, RN

## 2018-08-06 NOTE — Patient Care Conference (Signed)
Inpatient RehabilitationTeam Conference and Plan of Care Update Date: 08/06/2018   Time: 10:00 AM    Patient Name: [name]      Medical Record Number: [id number]  Date of Birth: 02/19/1941 Sex: Male         Room/Bed: 4M02C/4M02C-01 Payor Info: Payor: MEDICARE / Plan: MEDICARE PART A AND B / Product Type: *No Product type* /    Admitting Diagnosis: bil subural  hematomas  Admit Date/Time:  07/29/2018  5:28 PM Admission Comments: No comment available   Primary Diagnosis:  <principal problem not specified> Principal Problem: <principal problem not specified>  Patient Active Problem List   Diagnosis Date Noted  . Labile blood glucose   . Labile blood pressure   . Seizure prophylaxis   . Anemia of chronic disease   . TBI (traumatic brain injury) ([place]) 07/29/2018  . 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 Loose stools   . Dementia arising in the senium and presenium ([place])   . 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 Multiple fractures of ribs, left side, initial encounter for closed fracture 07/21/2018  . Subdural hematoma ([place]) 07/21/2018  . Angiodysplasia of stomach   . Occult GI bleeding   . GIB (gastrointestinal bleeding) 06/01/2018  . Low back pain 01/17/2018  . Anorexia   . Dyspnea   . Macrocytic anemia 06/04/2016  . Thrombocytopenia ([place]) 06/04/2016  . Exertional dyspnea 06/04/2016  . Arm paresthesia, left 06/04/2016  . Dyspnea on exertion 06/04/2016  . Tenderness of right calf 06/04/2016  . Mild cognitive impairment with memory loss 01/12/2016  . ESRD on dialysis ([place]) 05/17/2015  . Hypoglycemia   . Weakness 02/18/2015  . OSA (obstructive sleep apnea) 02/02/2015  . Hyperkalemia 10/05/2014  . Chronic kidney disease (CKD), stage IV (severe) ([place]) 04/07/2014  . End stage renal disease ([place]) 11/18/2013  . BPH (benign prostatic hyperplasia) 11/22/2012  . Peripheral vascular disease ([place]) 12/24/2011  . Hyperparathyroidism ([place])  12/24/2011  . Hypothyroidism 12/24/2011  . Allergic rhinitis 12/24/2011  . Erectile dysfunction 12/24/2011  . Insulin dependent diabetes mellitus ([place]) 09/03/2008  . Hyperlipidemia 09/03/2008  . Essential hypertension 08/30/2008  . PANCREATITIS, HX OF 08/30/2008  . RENAL FAILURE, ACUTE, HX OF 08/30/2008  . DIVERTICULOSIS, COLON 06/08/2003    Expected Discharge Date: Expected Discharge Date: 08/13/18  Team Members Present: Physician leading conference: Dr. Delice Lesch Social Worker Present: Ovidio Kin, LCSW Nurse Present: Rayetta Pigg, RN PT Present: Kem Parkinson, PT OT Present: Willeen Cass, OT SLP Present: Windell Moulding, SLP PPS Coordinator present : Daiva Nakayama, RN, CRRN     Current Status/Progress Goal Weekly Team Focus  Medical   Functional and cognitive deficits secondary to bi-frontal-temporal SDH's status post bur holes on 10/29.   Improve mobility, safety, cognition, DM  See above   Bowel/Bladder   Anuric (hemodialysis), continent/incontinent of bowel; LBM 08/05/18  Continent of bowel with mod assist  Assess bowel and bladder needs q shift and PRN   Swallow/Nutrition/ Hydration             ADL's   min A for transfers and min guard for bedmobility, min A for LB dressing and min to mod A for toileting   supervision   toileting, cogntive retraining, functional mobiltiy with RW, ongoing family education   Mobility   S/minA bed mobility, S/minA sit <>stand, min guard gait with RW and stairs; varies assist depending on fatigue/pain  S overall  LE strengthening/ROM, activity tolerance, standing  balance, gait/stair training   Communication             Safety/Cognition/ Behavioral Observations  slow progress, inconsistent but overall min assist   supervision   continue to address mildly complex cognition    Pain   No c/o pain   Pain </= 2/10  Assess pain q shift and PRN and medicate as necessary   Skin   AV fistula in right upper arm, sites healed from bilat  burr holes for drainage of subdural hematomas; no other skin issues   Remain free of infection/breakdown  Assess skin q shift and PRN and address any areas of concern       *See Care Plan and progress notes for long and short-term goals.     Barriers to Discharge  Current Status/Progress Possible Resolutions Date Resolved   Physician    Medical stability     See above  Therapies, optimize DM meds, optimize BP meds, follow labs      Nursing                  PT                    OT                  SLP                SW                Discharge Planning/Teaching Needs:  Home with wife if wife feels she can provide the care pt needs. She is very hesitant with how he is and concerned he will not be better by discharge. Have encouraged her to go to therapy with pt      Team Discussion:  Making improvement when participates in therapies. Fatigue and back pain limit him and he is also self limiting. K-pad ordered. MD monitoring his DM, BP and white count. Can reach supervision level but will need this for safety and cognitive deficits. Wife has been in for therapies.  Revisions to Treatment Plan:  DC 11/20    Continued Need for Acute Rehabilitation Level of Care: The patient requires daily medical management by a physician with specialized training in physical medicine and rehabilitation for the following conditions: Daily direction of a multidisciplinary physical rehabilitation program to ensure safe treatment while eliciting the highest outcome that is of practical value to the patient.: Yes Daily medical management of patient stability for increased activity during participation in an intensive rehabilitation regime.: Yes Daily analysis of laboratory values and/or radiology reports with any subsequent need for medication adjustment of medical intervention for : Neurological problems;Renal problems;Mood/behavior problems;Diabetes problems;Other   I attest that I was present, lead the  team conference, and concur with the assessment and plan of the team.   Elease Hashimoto 08/06/2018, 12:35 PM

## 2018-08-06 NOTE — Progress Notes (Signed)
Physical Therapy Session Note  Patient Details  Name: [name] MRN: [id number] Date of Birth: April 01, 1941  Today's Date: 08/06/2018 PT Individual Time: 1130-1202 PT Individual Time Calculation (min): 32 min   Short Term Goals: Week 1:  PT Short Term Goal 1 (Week 1): Pt will perform bed mobility with S PT Short Term Goal 2 (Week 1): Pt will perform sit >stand with consistent minA PT Short Term Goal 3 (Week 1): Pt will ambulate 150' with min guard and LRAD PT Short Term Goal 4 (Week 1): Pt will ascend/descend 8 steps with min guard  Skilled Therapeutic Interventions/Progress Updates:    Patient received in bed, pleasant and willing to participate with skilled PT session. Able to complete functional bed mobility with S and sit to stand with min guard today and RW, also min guard for stand pivot transfers throughout session. Able to gait train approximately 170f with min guard, RW and cues for safe use of RW and appropriate proximity to AD, also cues for upright posture and step length. Rode Nustep for 5 minutes at level 3 with LEs only, cues to remain at 50SPM as patient is very easily distracted and needed cues to stay on task. [facility] and was able to return to bed with min guard today. He was left in bed with alarm active, all needs met this morning.   Therapy Documentation Precautions:  Precautions Precautions: Fall Precaution Comments: HD M-W-F Restrictions Weight Bearing Restrictions: No General:   Vital Signs:   Pain: Pain Assessment Pain Scale: Faces Pain Score: 0-No pain Faces Pain Scale: Hurts a little bit Pain Type: Chronic pain Pain Location: Back Pain Orientation: Lower Pain Descriptors / Indicators: Aching;Sore Pain Onset: On-going Patients Stated Pain Goal: 0 Pain Intervention(s): Repositioned;Ambulation/increased activity;Heat applied    Therapy/Group: Individual Therapy  KDeniece ReePT, DPT,  CBIS  Supplemental Physical Therapist [facility]   Pager [phone] Rehab Office [phone]  08/06/2018, 12:20 PM

## 2018-08-06 NOTE — Progress Notes (Signed)
[place] PHYSICAL MEDICINE & REHABILITATION PROGRESS NOTE  Subjective/Complaints: Patient seen sitting up in bed this morning.  He states he slept well overnight.  He states he wants to go home.  ROS: Denies CP, shortness of breath, nausea, vomiting, diarrhea.  Objective: Vital Signs: Blood pressure 102/83, pulse 80, temperature 98.3 F (36.8 C), temperature source Oral, resp. rate 19, weight 63.5 kg, SpO2 97 %. No results found. Recent Labs    08/04/18 1357  WBC 17.3*  HGB 10.8*  HCT 33.7*  PLT 260   Recent Labs    08/04/18 1357  NA 129*  K 3.8  CL 90*  CO2 23  GLUCOSE 141*  BUN 88*  CREATININE 8.26*  CALCIUM 10.0    Physical Exam: BP 102/83 (BP Location: Right Leg)   Pulse 80   Temp 98.3 F (36.8 C) (Oral)   Resp 19   Wt 63.5 kg   SpO2 97%   BMI 19.51 kg/m  Constitutional: Well-developed. NAD HENT:Bilateral crani incisions healing Eyes:EOMI. No discharge.   Cardiovascular:RRR.  No JVD. Respiratory:Effort normal.  Clear. GI: He exhibitsno distension. Bowel sounds normal.  Musculoskeletal: He exhibits noedema and tenderness. Neurological: Alert and Oriented x 3, except for date of month.  Able to follow simple one step motor commands.  Motor: B/l UE 4+/5 proximal to distal B/l LE: HF 4+/5, KE 4+/5, ADF 5/5  HOH Skin: Warm and dry. Intact.Incision site C/D/I  Assessment/Plan: 1. 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  Physiatrist is providing close team supervision and 24 hour management of active medical problems listed below.  Physiatrist and rehab team continue to assess barriers to discharge/monitor patient progress toward functional and medical goals  Care Tool:  Bathing    Body parts bathed by patient: Right arm, Left arm, Chest, Abdomen, Face, Front perineal area, Buttocks, Right upper leg, Left upper leg   Body parts  bathed by helper: Right lower leg, Left lower leg     Bathing assist Assist Level: Moderate Assistance - Patient 50 - 74%     Upper Body Dressing/Undressing Upper body dressing   What is the patient wearing?: Button up shirt    Upper body assist Assist Level: Minimal Assistance - Patient > 75%    Lower Body Dressing/Undressing Lower body dressing      What is the patient wearing?: Pants, Incontinence brief     Lower body assist Assist for lower body dressing: Minimal Assistance - Patient > 75%     Toileting Toileting Toileting Activity did not occur (Clothing management and hygiene only): N/A (no void or bm)  Toileting assist Assist for toileting: Moderate Assistance - Patient 50 - 74%     Transfers Chair/bed transfer  Transfers assist     Chair/bed transfer assist level: Contact Guard/Touching assist     Locomotion Ambulation   Ambulation assist      Assist level: Minimal Assistance - Patient > 75% Assistive device: Walker-rolling Max distance: 75   Walk 10 feet activity   Assist     Assist level: Minimal Assistance - Patient > 75% Assistive device: Walker-rolling   Walk 50 feet activity   Assist    Assist level: Minimal Assistance - Patient > 75% Assistive device: Walker-rolling    Walk 150 feet activity   Assist Walk 150 feet activity did not occur: Safety/medical concerns(fatigue)  Assist level: Minimal Assistance - Patient > 75% Assistive device: Walker-rolling  Walk 10 feet on uneven surface  activity   Assist Walk 10 feet on uneven surfaces activity did not occur: Safety/medical concerns         Wheelchair     Assist Will patient use wheelchair at discharge?: No      Wheelchair assist level: Minimal Assistance - Patient > 75% Max wheelchair distance: 50    Wheelchair 50 feet with 2 turns activity    Assist        Assist Level: Minimal Assistance - Patient > 75%   Wheelchair 150 feet activity      Assist            Medical Problem List and Plan: 1.Functional and cognitive deficitssecondary to bi-frontal-temporal SDH's status post bur holes on 10/29.   Continue CIR  CT reviewed, stable on 11/7 2. DVT Prophylaxis/Anticoagulation: Mechanical:Sequential compression devices, below kneeBilateral lower extremities 3. Pain Management:tylenol pron 4. Mood:LCSW to follow for evaluation and support. 5. Neuropsych: This patientis notcapable of making decisions on hisown behalf. 6. Skin/Wound Care:routine pressure relief measures. 7. Fluids/Electrolytes/Nutrition:Monitor I/Os. Labs with HD 8. ESRD: HD MWF at end of the day to help with tolerance of therapy.  9. CAD: Stable  10 T2DM: Monitor BS ac/hs and use SSI for elevated BS.Was on Victoza at home due to multiple hypoglycemic episodes with long acting insulin.  Relatively controlled on 11/13  Cont SSI CBG (last 3)  Recent Labs    08/05/18 1629 08/05/18 2110 08/06/18 0621  GLUCAP 92 106* 87   11. Seizure prophylaxis:off  Keppra  12. H/O IBS: Discontinued laxatives due to reports of incontinence due to diarrhea:  13. Dementia: On Nameda and Aricept. 14. Loose stools. Wife reports history of cycling loose and then constipated stools PTA.   Senna started on 11/7 Added probiotic   Improving overall 15.  Labile blood pressure- proamatine ordered for HD days   Vitals:   08/05/18 1956 08/06/18 0450  BP: (!) 81/58 102/83  Pulse: 78 80  Resp: 17 19  Temp: 98.5 F (36.9 C) 98.3 F (36.8 C)  SpO2: 99% 97%   Labile on 11/13 16.  Anemia of chronic disease  Hemoglobin 10.8 on 11/11  Labs with HD  Continue to monitor 17.  Leukocytosis  WBCs 17.3 on 11/11  Labs with HD  Afebrile  Continue to monitor  LOS: 8 days A FACE TO FACE EVALUATION WAS PERFORMED  Shamikia Linskey [name] 08/06/2018, 9:15 AM

## 2018-08-06 NOTE — Progress Notes (Signed)
[facility] Progress Note   Dialysis Orders:  MWF [facility]  4h  71kg  2/2 bath  R AVF  Hep none (due to subdural bleed) -Mircera 150 mcg IV q 2 weeks (last dose 07/16/18 Last HGB 11.4 07/16/18)  Assessment: 1. Bilat SDH sp hematoma evacuation - on CIR now 2. ESRD - MWF. HD today.  3. Anemia - hgb > 10 stable; give maintenance aranesp 60 weekly 4. Secondary hyperparathyroidism - Corr ca has been elevated even prior to hospitalization.  Changed to 2 Ca dialysate and started sensipar 30 daily; calcitriol d/c'd.  PTH was 217 10/23, corr ca 11.4 at that time 5. HTN/volume - on midodrine pre HD. Sig wt loss, lower edw at dc, down 8kg 6. Nutrition - albumin noted < 3.  Cont supplements and multivit 7. Dementia - baseline but  functional prior to acute event. 8. Multiple rib fx - stable 9. DM/hypothyroidism - meds per primary  Kelly Splinter MD [facility] pgr [phone]   08/06/2018, 11:16 AM    Subjective: no complaints this AM  Objective Vitals:   08/05/18 0652 08/05/18 1430 08/05/18 1956 08/06/18 0450  BP: (!) 123/45 (!) 114/56 (!) 81/58 102/83  Pulse: 67 79 78 80  Resp: 15 18 17 19   Temp: 97.8 F (36.6 C) 97.6 F (36.4 C) 98.5 F (36.9 C) 98.3 F (36.8 C)  TempSrc: Oral   Oral  SpO2: 97% 100% 99% 97%  Weight: 63.5 kg      Physical Exam General: comfortable sitting up in bed Heart:RRR Lungs: normal WOB Abdomen: soft, nontender Extremities: no edema Dialysis Access: RUE AV access +thrill   Additional Objective Labs: Basic Metabolic Panel: Recent Labs  Lab 08/01/18 0455 08/03/18 0633 08/04/18 1357  NA 136 132* 129*  K 4.3 4.0 3.8  CL 95* 94* 90*  CO2 29 24 23   GLUCOSE 131* 111* 141*  BUN 49* 53* 88*  CREATININE 6.34* 5.97* 8.26*  CALCIUM 10.5* 10.1 10.0  PHOS 5.3* 5.8* 5.4*   Liver Function Tests: Recent Labs  Lab 08/01/18 0455 08/03/18 0633 08/04/18 1357  ALBUMIN 2.7* 2.6* 2.7*   No results for input(s): LIPASE,  AMYLASE in the last 168 hours. CBC: Recent Labs  Lab 07/30/18 1354 08/01/18 0455 08/03/18 0633 08/04/18 1357  WBC 9.0 9.9 9.9 17.3*  HGB 12.2* 11.5* 10.8* 10.8*  HCT 37.8* 36.9* 34.1* 33.7*  MCV 97.9 97.9 97.2 95.2  PLT 201 184 205 260   Blood Culture    Component Value Date/Time   SDES URINE, CATHETERIZED 01/23/2018 2043   [place] NONE 01/23/2018 2043   CULT  01/23/2018 2043    NO GROWTH Performed at [facility] [address] [place], [place] [address]    REPTSTATUS 01/25/2018 FINAL 01/23/2018 2043    Cardiac Enzymes: No results for input(s): CKTOTAL, CKMB, CKMBINDEX, TROPONINI in the last 168 hours. CBG: Recent Labs  Lab 08/05/18 0659 08/05/18 1159 08/05/18 1629 08/05/18 2110 08/06/18 0621  GLUCAP 123* 136* 92 106* 87   Iron Studies: No results for input(s): IRON, TIBC, TRANSFERRIN, FERRITIN in the last 72 hours. Lab Results  Component Value Date   INR 0.97 07/22/2018   INR 1.01 06/05/2016   Studies/Results: No results found. Medications: . sodium chloride    . sodium chloride     . Chlorhexidine Gluconate Cloth  6 each Topical Q0600  . cinacalcet  30 mg Oral Q supper  . darbepoetin (ARANESP) injection - DIALYSIS  60 mcg Intravenous Q  Mon-HD  . docusate sodium  100 mg Oral BID  . donepezil  10 mg Oral QHS  . feeding supplement (NEPRO CARB STEADY)  237 mL Oral BID BM  . feeding supplement (PRO-STAT SUGAR FREE 64)  30 mL Oral BID  . insulin aspart  0-9 Units Subcutaneous TID WC  . levothyroxine  50 mcg Oral QAC breakfast  . liraglutide  0.6 mg Subcutaneous Q0600  . memantine  10 mg Oral BID  . midodrine  10 mg Oral Q M,W,F-HD  . multivitamin  1 tablet Oral QHS  . pantoprazole  40 mg Oral Daily  . saccharomyces boulardii  250 mg Oral BID  . senna  1 tablet Oral QHS  . sevelamer carbonate  800 mg Oral Q supper  . simvastatin  20 mg Oral QHS  . vitamin B-12  1,000 mcg Oral Daily

## 2018-08-06 NOTE — Progress Notes (Signed)
Nutrition Follow-up  DOCUMENTATION CODES:   Severe malnutrition in context of chronic illness  INTERVENTION:   - Continue Nepro Shake po BID, each supplement provides 425 kcal and 19 grams protein  - Continue 30 ml Prostat BID, each supplement provides 100 kcals and 15 grams protein  - Continue rena-vit daily  - Afternoon snack ordered in [place] per pt and wife request (tuna salad with crackers)  - Consider liberalizing diet if poor PO intake persists given severe protein-calorie malnutrition  NUTRITION DIAGNOSIS:   Severe Malnutrition related to chronic illness (ESRD on HD) as evidenced by moderate fat depletion, severe muscle depletion, percent weight loss (25.8% weight loss in 1 year).  Ongoing  GOAL:   Patient will meet greater than or equal to 90% of their needs  Progressing  MONITOR:   PO intake, Supplement acceptance, I & O's, Weight trends, Labs, Skin  REASON FOR ASSESSMENT:   Malnutrition Screening Tool    ASSESSMENT:   77 year old male with PMH significant for ESRD on HD, type 2 diabetes mellitus, recent GI bleed due to AVM, hypertension, dementia, and OSA. Pt was admitted to [facility] on 10/27 with worsening confusion, headaches, and multiple falls and was found to have acute on chronic large bilateral subdural hemorrhages as well as multiple left rib fractures. Pt underwent bilateral bur hole evacuation of subdural hemorrhage on 10/29. Pt has had worsening cognitive status with confusion for the past couple of days with sleep-wake disruption.Repeat CT showed improvement inrightsubdural hemorrhageand left residual SDH now withmass-effect left frontal region.CIR recommended for follow-up therapy.  EDW: 71 kg. Noted plan to lower EDW at d/c due to significant weight loss. Net UF from 11/11: 1500 ml Post-HD weight from 11/11: 63.7 kg  Pt with 11 lb weight loss since admission to CIR. Suspect this is related in part to fluid status. However,  also suspect true dry weight loss given nephrology plan to lower EDW at d/c.  Spoke with pt and wife at bedside. Pt reports he is eating "good." [name] states that he eats well at breakfast and dinner but is unsure how he eats at lunch. Pt states that the food is sometimes cold and the he does not like some of the options offered to him.  Pt endorses drinking Nepro shakes and taking Pro-stat BID even though he does not like the Pro-stat. Pt believes he drinks at least 1 Nepro shake daily. [name] confirms.  Pt states that he enjoys snacks and often eats graham crackers and peanut butter. [name] with questions regarding other snack items. Discussed ordering tuna salad and crackers for afternoon snack. Pt and wife amenable to this. Pt states, "I'll eat that."  Took pt's preferences. Pt states that he does not like oatmeal and prefers grits. RD entered pt's preferences in [place].  Pt is surprised to find out that he lost weight. Pt states, "it's because my bowels are loose." RD explained importance of adequate PO intake and provided education regarding role of PO intake in healing and fueling therapies. Pt states, "I will do whatever it takes to get out of here."  Meal Completion: 0-100% x last 8 meals, average 41% (extremely varied)  Medications reviewed and include: Colace, Nepro BID (pt accepting >75% of the times offered), Pro-stat BID (pt accepting 100% of the times offered), SSI, levothyroxine, rena-vit, Protonix, Florastor, Senokot, Renvela, vitamin B-12  Labs reviewed (11/11): sodium 129 (L), phosphorus 5.4 (H) CBG's: 87, 106, 92, 136 x 24 hours  Diet Order:   Diet Order            Diet renal/carb modified with fluid restriction Diet-HS Snack? Nothing; Fluid restriction: 1200 mL Fluid; Room service appropriate? Yes; Fluid consistency: Thin  Diet effective now              EDUCATION NEEDS:   Education needs have been addressed  Skin:  Skin  Assessment: Skin Integrity Issues: Incisions: head  Last BM:  11/12 (medium type 5)  Height:   Ht Readings from Last 1 Encounters:  07/20/18 5\' 11"  (1.803 m)    Weight:   Wt Readings from Last 1 Encounters:  08/05/18 63.5 kg    Ideal Body Weight:  78.18 kg  BMI:  Body mass index is 19.51 kg/m.  Estimated Nutritional Needs:   Kcal:  2000-2200  Protein:  95-105 grams  Fluid:  UOP + 1000 ml    Gaynell Face, MS, RD, LDN Inpatient Clinical Dietitian Pager: [phone] Weekend/After Hours: [phone]

## 2018-08-07 ENCOUNTER — Inpatient Hospital Stay (HOSPITAL_COMMUNITY): Payer: Medicare Other | Admitting: Occupational Therapy

## 2018-08-07 ENCOUNTER — Inpatient Hospital Stay (HOSPITAL_COMMUNITY): Payer: Medicare Other | Admitting: Speech Pathology

## 2018-08-07 ENCOUNTER — Inpatient Hospital Stay (HOSPITAL_COMMUNITY): Payer: Medicare Other | Admitting: Physical Therapy

## 2018-08-07 DIAGNOSIS — Z992 Dependence on renal dialysis: Secondary | ICD-10-CM

## 2018-08-07 DIAGNOSIS — N186 End stage renal disease: Secondary | ICD-10-CM

## 2018-08-07 LAB — GLUCOSE, CAPILLARY
Glucose-Capillary: 105 mg/dL — ABNORMAL HIGH (ref 70–99)
Glucose-Capillary: 91 mg/dL (ref 70–99)
Glucose-Capillary: 173 mg/dL — ABNORMAL HIGH (ref 70–99)
Glucose-Capillary: 122 mg/dL — ABNORMAL HIGH (ref 70–99)

## 2018-08-07 MED ORDER — CHLORHEXIDINE GLUCONATE CLOTH 2 % EX PADS
6.0000 | MEDICATED_PAD | Freq: Every day | CUTANEOUS | Status: DC
  Administered 2018-08-11: 6 via TOPICAL

## 2018-08-07 NOTE — Progress Notes (Signed)
Speech Language Pathology Daily Session Note  Patient Details  Name: [name] MRN: [id number] Date of Birth: Sep 14, 1941  Today's Date: 08/07/2018 SLP Individual Time: 1401-1505 SLP Individual Time Calculation (min): 64 min  Short Term Goals: Week 2: SLP Short Term Goal 1 (Week 2): Pt will utilize external aids to recall daily information with min assist verbal cues.   SLP Short Term Goal 2 (Week 2): Pt will complete mildly complex tasks with min assist for functional problem solving.   SLP Short Term Goal 3 (Week 2): Pt will recognize and correct errors in the moment during functional tasks with min assist verbal cues.    Skilled Therapeutic Interventions:  Pt was seen for skilled ST targeting cognitive goals.  SLP facilitated the session with a novel schedule task to address error awareness, reasoning, and functional problem solving.   Pt needed overall max assist to complete task which appeared to be related to decreased working memory of task goals.  Pt was returned to room and left in bed with bed alarm set and call bell within reach.  Continue per current plan of care.    Pain Pain Assessment Pain Scale: 0-10 Pain Score: 0-No pain  Therapy/Group: Individual Therapy  [name], Selinda Orion 08/07/2018, 3:40 PM

## 2018-08-07 NOTE — Progress Notes (Signed)
Physical Therapy Weekly Progress Note  Patient Details  Name: [name] MRN: [id number] Date of Birth: 1941/09/11  Beginning of progress report period: July 30, 2018 End of progress report period: August 07, 2018  Today's Date: 08/07/2018  Patient has met 2 of 4 short term goals.  Pt currently requires variable S to minA for bed mobility, transfers, gait up to 100' at most with RW, and min guard for stairs with bilateral rails. Patient has demonstrated slow progress towards goals d/t fatigue associated with dialysis, back pain, and self-limiting behaviors with decreased motivation to participate. Ongoing education with pt and wife regarding goals of therapy, purpose of participation for strengthening and improving endurance to facilitate independence and safety upon return to home.   Patient continues to demonstrate the following deficits muscle weakness and muscle joint tightness, decreased cardiorespiratoy endurance, decreased attention, decreased awareness, decreased safety awareness and decreased memory and decreased standing balance, decreased postural control and decreased balance strategies and therefore will continue to benefit from skilled PT intervention to increase functional independence with mobility.  Patient progressing slowly.  Continue plan of care.  PT Short Term Goals Week 1:  PT Short Term Goal 1 (Week 1): Pt will perform bed mobility with S PT Short Term Goal 1 - Progress (Week 1): Progressing toward goal PT Short Term Goal 2 (Week 1): Pt will perform sit >stand with consistent minA PT Short Term Goal 2 - Progress (Week 1): Met PT Short Term Goal 3 (Week 1): Pt will ambulate 150' with min guard and LRAD PT Short Term Goal 3 - Progress (Week 1): Progressing toward goal PT Short Term Goal 4 (Week 1): Pt will ascend/descend 8 steps with min guard PT Short Term Goal 4 - Progress (Week 1): Met Week 2:  PT Short Term Goal 1 (Week 2): =LTG at S overall due to  estimated LOS   Therapy Documentation Precautions:  Precautions Precautions: Fall Precaution Comments: HD M-W-F Restrictions Weight Bearing Restrictions: No   Corliss Skains 08/07/2018, 7:59 AM

## 2018-08-07 NOTE — Progress Notes (Signed)
Occupational Therapy Session Note  Patient Details  Name: CRISTOPHER CICCARELLI MRN: [id number] Date of Birth: 05-21-1941  Today's Date: 08/07/2018 OT Individual Time: 1300-1400 OT Individual Time Calculation (min): 60 min    Short Term Goals: Week 2:     Skilled Therapeutic Interventions/Progress Updates:    Patient in bed with many layers of blankets, c/o being cold but agreeable to participate in therapy session.  He denies pain but states that dialysis took a lot out of him yesterday. TA = Ambulation in room in to therapy area with RW CG A with cues for step length and walker position.  Functional transfers:   to/from sofa with min/mod A to stand from low surface, CG bed and arm chair.   Bed mobility with min A Patient completed light stretching activity, unsupported sitting S level to eat some of his lunch. Patient able to recall planned discharge date and recent events but perseverates on not being able to go home for a few hours to have a meal with his family.  Patient returned to bed with alarms set    Therapy Documentation Precautions:  Precautions Precautions: Fall Precaution Comments: HD M-W-F Restrictions Weight Bearing Restrictions: No General:   Vital Signs: Therapy Vitals Pulse Rate: 77 Resp: 14 BP: 103/88 Patient Position (if appropriate): Lying Oxygen Therapy SpO2: 100 % O2 Device: Room Air Pain: Pain Assessment Pain Scale: 0-10 Pain Score: 0-No pain   Therapy/Group: Individual Therapy  Carlos Levering 08/07/2018, 3:55 PM

## 2018-08-07 NOTE — Progress Notes (Signed)
Physical Therapy Session Note  Patient Details  Name: [name] MRN: [id number] Date of Birth: 03/04/1941  Today's Date: 08/07/2018 PT Individual Time: 0900-1000 PT Individual Time Calculation (min): 60 min   Short Term Goals: Week 2:  PT Short Term Goal 1 (Week 2): =LTG at S overall due to estimated LOS  Skilled Therapeutic Interventions/Progress Updates:    Patient received in bed, pleasant and asking to work on [employer] again. He is able to complete bed mobility with S, performed most transfers with min guard with RW however this did progress to MinA with RW as fatigue increased throughout session. Able to gait train approximately 34f x2 with RW, S, and with one seated rest break in between bouts of gait, limited by fatigue today possibly from HD yesterday and limited sleep last night per his report. Rode Nustep with B UEs and LEs on resistance 3, 3x3 minutes limited due to fatigue and extended rest breaks due to fatigue. Transferred back to chair with MinA, and from WBrinsmadeto mat table with RW and MinA. Worked on functional strengthening as able with remainder of session, then [facility] MInA with RW. He was left in bed with all needs met, bed alarm active this morning.   Therapy Documentation Precautions:  Precautions Precautions: Fall Precaution Comments: HD M-W-F Restrictions Weight Bearing Restrictions: No General:   Vital Signs:  Pain: Pain Assessment Pain Scale: 0-10 Pain Score: 0-No pain    Therapy/Group: Individual Therapy  KDeniece ReePT, DPT, CBIS  Supplemental Physical Therapist [facility]   Pager [phone] Rehab Office [phone]  08/07/2018, 12:21 PM

## 2018-08-07 NOTE — Progress Notes (Signed)
[place] PHYSICAL MEDICINE & REHABILITATION PROGRESS NOTE  Subjective/Complaints: Patient seen sitting up in bed this morning.  He states he did not sleep well overnight because his room was cold.  Discussed with nursing, maintenance:.  Patient perseverative on wanting to go home.  ROS: Denies CP, shortness of breath, nausea, vomiting, diarrhea.  Objective: Vital Signs: Blood pressure (!) 80/57, pulse 79, temperature 98 F (36.7 C), resp. rate 16, weight 62.9 kg, SpO2 100 %. No results found. Recent Labs    08/04/18 1357 08/06/18 1326  WBC 17.3* 8.8  HGB 10.8* 10.1*  HCT 33.7* 30.5*  PLT 260 218   Recent Labs    08/04/18 1357 08/06/18 1326  NA 129* 132*  K 3.8 3.5  CL 90* 95*  CO2 23 23  GLUCOSE 141* 155*  BUN 88* 53*  CREATININE 8.26* 5.42*  CALCIUM 10.0 9.1    Physical Exam: BP (!) 80/57 (BP Location: Right Leg)   Pulse 79   Temp 98 F (36.7 C)   Resp 16   Wt 62.9 kg   SpO2 100%   BMI 19.34 kg/m  Constitutional: Well-developed. NAD HENT:Bilateral crani incisions healing Eyes:EOMI. No discharge.   Cardiovascular:RRR.  No JVD. Respiratory:Effort normal.  Clear. GI: He exhibitsno distension. Bowel sounds normal.  Musculoskeletal: He exhibits noedema and tenderness. Neurological: Alert and Oriented x 3, except for date of month.  Able to follow simple one step motor commands.  Motor: B/l UE 4+-5/5 proximal to distal B/l LE: HF 4+/5, KE 4+-5/5, ADF 5/5  HOH Skin: Warm and dry. Intact.Incision site C/D/I  Assessment/Plan: 1. 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  Physiatrist is providing close team supervision and 24 hour management of active medical problems listed below.  Physiatrist and rehab team continue to assess barriers to discharge/monitor patient progress toward functional and medical goals  Care Tool:  Bathing    Body  parts bathed by patient: Right arm, Left arm, Chest, Abdomen, Right upper leg, Left upper leg, Front perineal area, Face   Body parts bathed by helper: Buttocks, Right lower leg, Left lower leg     Bathing assist Assist Level: Moderate Assistance - Patient 50 - 74%     Upper Body Dressing/Undressing Upper body dressing   What is the patient wearing?: Pull over shirt    Upper body assist Assist Level: Minimal Assistance - Patient > 75%    Lower Body Dressing/Undressing Lower body dressing      What is the patient wearing?: Underwear/pull up, Pants     Lower body assist Assist for lower body dressing: Moderate Assistance - Patient 50 - 74%     Toileting Toileting Toileting Activity did not occur (Clothing management and hygiene only): N/A (no void or bm)  Toileting assist Assist for toileting: Moderate Assistance - Patient 50 - 74%     Transfers Chair/bed transfer  Transfers assist     Chair/bed transfer assist level: Contact Guard/Touching assist     Locomotion Ambulation   Ambulation assist      Assist level: Contact Guard/Touching assist Assistive device: Walker-rolling Max distance: 100   Walk 10 feet activity   Assist     Assist level: Contact Guard/Touching assist Assistive device: Walker-rolling   Walk 50 feet activity   Assist    Assist level: Contact Guard/Touching assist Assistive device: Walker-rolling    Walk 150 feet activity   Assist Walk 150 feet activity  did not occur: Safety/medical concerns(fatigue)  Assist level: Minimal Assistance - Patient > 75% Assistive device: Walker-rolling    Walk 10 feet on uneven surface  activity   Assist Walk 10 feet on uneven surfaces activity did not occur: Safety/medical concerns         Wheelchair     Assist Will patient use wheelchair at discharge?: No      Wheelchair assist level: Minimal Assistance - Patient > 75% Max wheelchair distance: 50    Wheelchair 50 feet  with 2 turns activity    Assist        Assist Level: Minimal Assistance - Patient > 75%   Wheelchair 150 feet activity     Assist            Medical Problem List and Plan: 1.Functional and cognitive deficitssecondary to bi-frontal-temporal SDH's status post bur holes on 10/29.   Continue CIR  CT reviewed, stable on 11/7 2. DVT Prophylaxis/Anticoagulation: Mechanical:Sequential compression devices, below kneeBilateral lower extremities 3. Pain Management:tylenol pron 4. Mood:LCSW to follow for evaluation and support. 5. Neuropsych: This patientis notcapable of making decisions on hisown behalf. 6. Skin/Wound Care:routine pressure relief measures. 7. Fluids/Electrolytes/Nutrition:Monitor I/Os. Labs with HD 8. ESRD: HD MWF at end of the day to help with tolerance of therapy.  9. CAD: Stable  10 T2DM: Monitor BS ac/hs and use SSI for elevated BS.Was on Victoza at home due to multiple hypoglycemic episodes with long acting insulin.  Relatively controlled on 11/14  Cont SSI CBG (last 3)  Recent Labs    08/06/18 1641 08/06/18 2029 08/07/18 0641  GLUCAP 124* 114* 105*   11. Seizure prophylaxis:off  Keppra  12. H/O IBS: Discontinued laxatives due to reports of incontinence due to diarrhea:  13. Dementia: On Nameda and Aricept. 14. Loose stools. Wife reports history of cycling loose and then constipated stools PTA.   Senna started on 11/7 Added probiotic   Improving overall 15.  Labile blood pressure- proamatine ordered for HD days   Vitals:   08/06/18 1938 08/07/18 0445  BP: 131/66 (!) 80/57  Pulse: 67 79  Resp: 16 16  Temp: 97.9 F (36.6 C) 98 F (36.7 C)  SpO2:     Labile with HD on 11/14 16.  Anemia of chronic disease  Hemoglobin 10.1 on 11/13  Labs with HD  Continue to monitor 17.  Leukocytosis  WBCs 8.8 on 11/13  Labs with HD  Afebrile  Continue to monitor  LOS: 9 days A FACE TO FACE EVALUATION WAS  PERFORMED  Ankit [name] 08/07/2018, 8:32 AM

## 2018-08-08 ENCOUNTER — Inpatient Hospital Stay (HOSPITAL_COMMUNITY): Payer: Medicare Other | Admitting: Occupational Therapy

## 2018-08-08 ENCOUNTER — Inpatient Hospital Stay (HOSPITAL_COMMUNITY): Payer: Medicare Other | Admitting: Physical Therapy

## 2018-08-08 ENCOUNTER — Inpatient Hospital Stay (HOSPITAL_COMMUNITY): Payer: Medicare Other | Admitting: Speech Pathology

## 2018-08-08 LAB — RENAL FUNCTION PANEL
Albumin: 2.7 g/dL — ABNORMAL LOW (ref 3.5–5.0)
Anion gap: 13 (ref 5–15)
BUN: 68 mg/dL — ABNORMAL HIGH (ref 8–23)
CO2: 28 mmol/L (ref 22–32)
Calcium: 10.4 mg/dL — ABNORMAL HIGH (ref 8.9–10.3)
Chloride: 96 mmol/L — ABNORMAL LOW (ref 98–111)
Creatinine, Ser: 6.88 mg/dL — ABNORMAL HIGH (ref 0.61–1.24)
GFR calc Af Amer: 8 mL/min — ABNORMAL LOW (ref >60)
GFR calc non Af Amer: 7 mL/min — ABNORMAL LOW (ref >60)
Glucose, Bld: 167 mg/dL — ABNORMAL HIGH (ref 70–99)
Phosphorus: 4.7 mg/dL — ABNORMAL HIGH (ref 2.5–4.6)
Potassium: 4 mmol/L (ref 3.5–5.1)
Sodium: 137 mmol/L (ref 135–145)

## 2018-08-08 LAB — CBC
HCT: 32 % — ABNORMAL LOW (ref 39.0–52.0)
Hemoglobin: 10 g/dL — ABNORMAL LOW (ref 13.0–17.0)
MCH: 30.8 pg (ref 26.0–34.0)
MCHC: 31.3 g/dL (ref 30.0–36.0)
MCV: 98.5 fL (ref 80.0–100.0)
Platelets: 199 10*3/uL (ref 150–400)
RBC: 3.25 MIL/uL — ABNORMAL LOW (ref 4.22–5.81)
RDW: 15.4 % (ref 11.5–15.5)
WBC: 10.6 10*3/uL — ABNORMAL HIGH (ref 4.0–10.5)
nRBC: 0 % (ref 0.0–0.2)

## 2018-08-08 LAB — GLUCOSE, CAPILLARY
Glucose-Capillary: 123 mg/dL — ABNORMAL HIGH (ref 70–99)
Glucose-Capillary: 166 mg/dL — ABNORMAL HIGH (ref 70–99)
Glucose-Capillary: 159 mg/dL — ABNORMAL HIGH (ref 70–99)
Glucose-Capillary: 120 mg/dL — ABNORMAL HIGH (ref 70–99)

## 2018-08-08 MED ORDER — PENTAFLUOROPROP-TETRAFLUOROETH EX AERO: 1.0000 "application " | INHALATION_SPRAY | CUTANEOUS | Status: DC | PRN

## 2018-08-08 NOTE — Progress Notes (Signed)
Pt complained of pain at access site, at the start of treatment. Attempt to recannulate pts failed due to profuse bleeding at fisula site. MD notified, ordered to reschedule HD for tomorrow, Pt is stable, denies pain, SOB. Bleeding controlled. Pt is stable for transfer

## 2018-08-08 NOTE — Progress Notes (Signed)
Speech Language Pathology Daily Session Note  Patient Details  Name: [name] MRN: [id number] Date of Birth: 1940/10/08  Today's Date: 08/08/2018 SLP Individual Time: 0805-0900 SLP Individual Time Calculation (min): 55 min  Short Term Goals: Week 2: SLP Short Term Goal 1 (Week 2): Pt will utilize external aids to recall daily information with min assist verbal cues.   SLP Short Term Goal 2 (Week 2): Pt will complete mildly complex tasks with min assist for functional problem solving.   SLP Short Term Goal 3 (Week 2): Pt will recognize and correct errors in the moment during functional tasks with min assist verbal cues.    Skilled Therapeutic Interventions:  Pt was seen for skilled ST targeting cognitive goals.  Pt was received in bed, awake, alert, and agreeable to participate in therapy with minimal encouragement for getting out of bed.  Pt asked "Why do we have to do speech therapy?" but could recall general activities from previous sessions.  SLP reviewed and reinforced rationale for ST interventions in maximizing pt's functional independence.  SLP facilitated the session with a pipe tree task to address attention and problem solving goals.  Pt could recreate structures from images using PVC pipe with min cues for task planning and organization.  Pt was returned to room and left in bed with bed alarm set and all needs within reach.  Continue per current plan of care.    Pain Pain Assessment Pain Scale: 0-10 Pain Score: 0-No pain  Therapy/Group: Individual Therapy  Cordaryl Decelles, Selinda Orion 08/08/2018, 10:32 AM

## 2018-08-08 NOTE — Progress Notes (Signed)
Physical Therapy Session Note  Patient Details  Name: [name] MRN: [id number] Date of Birth: [birth year]  Today's Date: 08/08/2018 PT Individual Time: 1000-1100 PT Individual Time Calculation (min): 60 min   Short Term Goals: Week 2:  PT Short Term Goal 1 (Week 2): =LTG at S overall due to estimated LOS  Skilled Therapeutic Interventions/Progress Updates:    Pt received seated in bed, agreeable with encouragement to participate in therapy session. Bed mobility Supervision with increased time needed. SPT bed to w/c with RW and CGA. Nustep level 3 for 2 x 3 min with B UE/LE with encouragement to reach 50 spm to keep pt focus on task. Pt easily distracted and needs to be redirected to focus on therapy tasks. Standing balance with RW while performing bean bag toss in [employer]. Pt requires CGA and one UE on RW for balance. Pt requests to use the bathroom. Toilet transfer with min A and use of RW and grab bar. Pt is setup A for clothing management and dependent for pericare. Ambulation x 50 ft with RW and close Supervision. Sit to supine Supervision. Pt left semi-reclined in bed with needs in reach and bed alarm in place.  Therapy Documentation Precautions:  Precautions Precautions: Fall Precaution Comments: HD M-W-F Restrictions Weight Bearing Restrictions: No Pain: Pain Assessment Pain Scale: 0-10 Pain Score: 0-No pain    Therapy/Group: Individual Therapy  Excell Seltzer, PT, DPT  08/08/2018, 12:22 PM

## 2018-08-08 NOTE — Progress Notes (Signed)
Occupational Therapy Session Note  Patient Details  Name: [name] MRN: [id number] Date of Birth: 1940/10/24  Today's Date: 08/08/2018 OT Individual Time: 2957-4734 OT Individual Time Calculation (min): 57 min    Short Term Goals: Week 2:     Skilled Therapeutic Interventions/Progress Updates:    Patient in bed at start of session.  He denies pain and needs encouragement to participate today, stating "I have dialysis later and need to save my energy".  He did agree to some light activity t/o session. Bed mobility:  S/min A to position in bed with rails, SSP to/from supine CS with rails Functional transfers:  SPT with RW to/from bed, w/c CG/CS Functional ambulation:  Short distance ambulation with RW CS with cues for walker position  - max A for w/c propulsion with bilateral LEs  Standing tolerance:  Stood x 2 for activity approx 1 minute due to fatigue TA:  Light UB and core stretching/reaching activity Patient returned to bed and set up for lunch at close of session.  He is pleasant but resistant at times to complete dynamic activity.  Bed alarm set Therapy Documentation Precautions:  Precautions Precautions: Fall Precaution Comments: HD M-W-F Restrictions Weight Bearing Restrictions: No General:   Vital Signs: Therapy Vitals Temp: 98.5 F (36.9 C) Temp Source: Oral Pulse Rate: 70 BP: (!) 129/53 Patient Position (if appropriate): Lying Oxygen Therapy O2 Device: Room Air Pain: Pain Assessment Pain Scale: 0-10 Pain Score: 0-No pain   Therapy/Group: Individual Therapy  Carlos Levering 08/08/2018, 12:09 PM

## 2018-08-08 NOTE — Progress Notes (Signed)
[place] PHYSICAL MEDICINE & REHABILITATION PROGRESS NOTE  Subjective/Complaints: Patient seen sitting up in bed this morning.  Slept well overnight.  He is more pleasant today, but also verbose and repetitive repeatedly asking questions about how he is doing and what led to him being in hospital.  ROS: Denies CP, shortness of breath, nausea, vomiting, diarrhea.  Objective: Vital Signs: Blood pressure 95/66, pulse 88, temperature 98.7 F (37.1 C), temperature source Oral, resp. rate 19, weight 63.9 kg, SpO2 100 %. No results found. Recent Labs    08/06/18 1326  WBC 8.8  HGB 10.1*  HCT 30.5*  PLT 218   Recent Labs    08/06/18 1326  NA 132*  K 3.5  CL 95*  CO2 23  GLUCOSE 155*  BUN 53*  CREATININE 5.42*  CALCIUM 9.1    Physical Exam: BP 95/66 (BP Location: Left Leg)   Pulse 88   Temp 98.7 F (37.1 C) (Oral)   Resp 19   Wt 63.9 kg   SpO2 100%   BMI 19.65 kg/m  Constitutional: Well-developed. NAD HENT:Bilateral crani incisions healing Eyes:EOMI. No discharge.   Cardiovascular:RRR.  No JVD. Respiratory:Effort normal.  Clear. GI: He exhibitsno distension. Bowel sounds normal.  Musculoskeletal: He exhibits noedema and tenderness. Neurological: Alert and Oriented x 3, except for date of month.  Able to follow simple one step motor commands.  Motor: B/l UE 4+-5/5 proximal to distal, stable B/l LE: HF 4+/5, KE 4+-5/5, ADF 5/5, stable HOH Skin: Warm and dry. Intact.Incision site C/D/I  Assessment/Plan: 1. 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  Physiatrist is providing close team supervision and 24 hour management of active medical problems listed below.  Physiatrist and rehab team continue to assess barriers to discharge/monitor patient progress toward functional and medical goals  Care Tool:  Bathing    Body parts bathed by patient:  Right arm, Left arm, Chest, Abdomen, Right upper leg, Left upper leg, Front perineal area, Face   Body parts bathed by helper: Buttocks, Right lower leg, Left lower leg     Bathing assist Assist Level: Moderate Assistance - Patient 50 - 74%     Upper Body Dressing/Undressing Upper body dressing   What is the patient wearing?: Pull over shirt    Upper body assist Assist Level: Minimal Assistance - Patient > 75%    Lower Body Dressing/Undressing Lower body dressing      What is the patient wearing?: Underwear/pull up, Pants     Lower body assist Assist for lower body dressing: Moderate Assistance - Patient 50 - 74%     Toileting Toileting Toileting Activity did not occur (Clothing management and hygiene only): N/A (no void or bm)  Toileting assist Assist for toileting: Moderate Assistance - Patient 50 - 74%     Transfers Chair/bed transfer  Transfers assist     Chair/bed transfer assist level: Supervision/Verbal cueing     Locomotion Ambulation   Ambulation assist      Assist level: Supervision/Verbal cueing Assistive device: Walker-rolling Max distance: 75   Walk 10 feet activity   Assist     Assist level: Supervision/Verbal cueing Assistive device: Walker-rolling   Walk 50 feet activity   Assist    Assist level: Supervision/Verbal cueing Assistive device: Walker-rolling    Walk 150 feet activity   Assist Walk 150 feet activity did not occur: Safety/medical concerns(fatigue)  Assist level: Minimal Assistance - Patient >  75% Assistive device: Walker-rolling    Walk 10 feet on uneven surface  activity   Assist Walk 10 feet on uneven surfaces activity did not occur: Safety/medical concerns         Wheelchair     Assist Will patient use wheelchair at discharge?: No      Wheelchair assist level: Minimal Assistance - Patient > 75% Max wheelchair distance: 50    Wheelchair 50 feet with 2 turns activity    Assist         Assist Level: Minimal Assistance - Patient > 75%   Wheelchair 150 feet activity     Assist            Medical Problem List and Plan: 1.Functional and cognitive deficitssecondary to bi-frontal-temporal SDH's status post bur holes on 10/29.   Continue CIR  CT reviewed, stable on 11/7 2. DVT Prophylaxis/Anticoagulation: Mechanical:Sequential compression devices, below kneeBilateral lower extremities 3. Pain Management:tylenol pron 4. Mood:LCSW to follow for evaluation and support. 5. Neuropsych: This patientis notcapable of making decisions on hisown behalf. 6. Skin/Wound Care:routine pressure relief measures. 7. Fluids/Electrolytes/Nutrition:Monitor I/Os. Labs with HD 8. ESRD: HD MWF at end of the day to help with tolerance of therapy.  9. CAD: Stable  10 T2DM: Monitor BS ac/hs and use SSI for elevated BS.Was on Victoza at home due to multiple hypoglycemic episodes with long acting insulin.  Labile on 11/15  Cont SSI CBG (last 3)  Recent Labs    08/07/18 1618 08/07/18 2115 08/08/18 0629  GLUCAP 173* 122* 123*   11. Seizure prophylaxis:off  Keppra  12. H/O IBS: Discontinued laxatives due to reports of incontinence due to diarrhea:  13. Dementia: On Nameda and Aricept. 14. Loose stools. Wife reports history of cycling loose and then constipated stools PTA.   Senna started on 11/7 Added probiotic   Improving overall 15.  Labile blood pressure- proamatine ordered for HD days   Vitals:   08/07/18 1416 08/07/18 2017  BP: 103/88 95/66  Pulse: 77 88  Resp: 14 19  Temp:  98.7 F (37.1 C)  SpO2: 100% 100%   Relatively normal/15 16.  Anemia of chronic disease  Hemoglobin 10.1 on 11/13  Hemoccult positive  Labs with HD  Continue to monitor 17.  Leukocytosis  WBCs 8.8 on 11/13  Labs with HD  Afebrile  Continue to monitor  LOS: 10 days A FACE TO FACE EVALUATION WAS PERFORMED  Ankit [name] 08/08/2018, 9:43 AM

## 2018-08-08 NOTE — Progress Notes (Signed)
Social Work Patient ID: [name], male   DOB: [birth year], 77 y.o.   MRN: [id number] Met with pt and wife yesterday to discuss discharge needs and plans. Have asked wife to come in and participate in therapies with pt. She is aware we recommend 24 hr supervision level. She has spoken with the [facility] to find out he is eligible for aide services for 2-3 hours daily. Have faxed over the prescription for this service to be set up via the [place]. He has been approved prior to admission for Home Health-PT, OT, SP services via [facility]. Will have them resume services. Discussed needing a hospital bed for a short time. Wife prefers this. She wants to try and see how it goes at home due to pt would refuse NHP. Pt is very vocal in his wants and dislikes. Discussed he would go to HD prior to going home on Wed 11/20.

## 2018-08-08 NOTE — Progress Notes (Signed)
Returned to unit by bed p bleeding from AVF, unable to complete dialysis. Fistulogram tomorrow. PT states an understanding of information. Dressing intact rt upper arm and no bleeding through dressing noted. Linen changed and pt repositioned.  Margarito Liner

## 2018-08-09 ENCOUNTER — Inpatient Hospital Stay (HOSPITAL_COMMUNITY): Payer: Medicare Other

## 2018-08-09 ENCOUNTER — Inpatient Hospital Stay (HOSPITAL_COMMUNITY): Payer: Medicare Other | Admitting: Physical Therapy

## 2018-08-09 DIAGNOSIS — T8249XA Other complication of vascular dialysis catheter, initial encounter: Secondary | ICD-10-CM

## 2018-08-09 LAB — RENAL FUNCTION PANEL
Albumin: 2.6 g/dL — ABNORMAL LOW (ref 3.5–5.0)
Anion gap: 13 (ref 5–15)
BUN: 98 mg/dL — ABNORMAL HIGH (ref 8–23)
CO2: 25 mmol/L (ref 22–32)
Calcium: 10.5 mg/dL — ABNORMAL HIGH (ref 8.9–10.3)
Chloride: 97 mmol/L — ABNORMAL LOW (ref 98–111)
Creatinine, Ser: 8.52 mg/dL — ABNORMAL HIGH (ref 0.61–1.24)
GFR calc Af Amer: 6 mL/min — ABNORMAL LOW (ref >60)
GFR calc non Af Amer: 5 mL/min — ABNORMAL LOW (ref >60)
Glucose, Bld: 99 mg/dL (ref 70–99)
Phosphorus: 5.7 mg/dL — ABNORMAL HIGH (ref 2.5–4.6)
Potassium: 3.8 mmol/L (ref 3.5–5.1)
Sodium: 135 mmol/L (ref 135–145)

## 2018-08-09 LAB — GLUCOSE, CAPILLARY
Glucose-Capillary: 113 mg/dL — ABNORMAL HIGH (ref 70–99)
Glucose-Capillary: 245 mg/dL — ABNORMAL HIGH (ref 70–99)
Glucose-Capillary: 218 mg/dL — ABNORMAL HIGH (ref 70–99)
Glucose-Capillary: 233 mg/dL — ABNORMAL HIGH (ref 70–99)

## 2018-08-09 NOTE — Progress Notes (Signed)
Occupational Therapy Session Note  Patient Details  Name: [name] MRN: [id number] Date of Birth: May 28, 1941  Today's Date: 08/09/2018 OT Individual Time: 5038-8828 OT Individual Time Calculation (min): 60 min    Short Term Goals: Week 1:  OT Short Term Goal 1 (Week 1): Pt will transfer with min A to and from the BSC/ toilet with LRAD OT Short Term Goal 2 (Week 1): Pt will be able to don shirt from folded position with min A with VC for problem solving OT Short Term Goal 3 (Week 1): Pt will don LB clothing with mod A sit to stand including slide on shoes  OT Short Term Goal 4 (Week 1): Pt will stand to perform 2 grooming tasks at sink with min A; showing improve endurance Skilled Therapeutic Interventions/Progress Updates:    Session focused on toileting tasks and functional activity tolerance. Pt initially refused any participation in therapy but then requested to use bathroom. Pt completed functional mobility with RW into bathroom with min A, moderate cueing required for RW management and positioning. Pt completing clothing management before and after toileting with CGA. Pt refused to attempt posterior peri hygiene, claiming " he just cant do it" despite encouragement, requiring total A. Pt reported pain in back as recorded below, pt was put back to bed at end of session with K pad on back, providing relief. Bed alarm set and all needs met.   Therapy Documentation Precautions:  Precautions Precautions: Fall Precaution Comments: HD M-W-F Restrictions Weight Bearing Restrictions: No   Pain: Pain Assessment Pain Scale: 0-10 Pain Score: 4  Pain Type: Acute pain Pain Location: Back Pain Orientation: Lower Pain Descriptors / Indicators: Aching Pain Onset: On-going Pain Intervention(s): Ambulation/increased activity;Rest   Therapy/Group: Individual Therapy  Curtis Sites 08/09/2018, 12:26 PM

## 2018-08-09 NOTE — Progress Notes (Addendum)
Subjective:   Pt frustrated about HD yest / AVf bleed and attempt to  recannulate  failed with plans for HD today.   Objective Vital signs in last 24 hours: Vitals:   08/08/18 1736 08/08/18 2015 08/09/18 0316 08/09/18 0330  BP: 108/73 134/66 137/70 (!) 155/86  Pulse: 74 74 75 72  Resp:  14 16   Temp:  98.1 F (36.7 C) 98.3 F (36.8 C)   TempSrc:  Oral    SpO2: 100% 100% 98% 100%  Weight:       Weight change: -0.1 kg  Physical Exam: General: alert elderly WM , NAD  Heart: RRR, no m,r,g Lungs: CTA  Abdomen: soft,Nt, Nd  Extremities: no pedal edema Dialysis Access: RUA AVF pos. 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 Bilat SDH sp hematoma evacuation - on CIR now 2. ESRD- MWF. HD yesterday noted per HD RN failed attempt to cannulate with AVF bleed  /plans to recannulate today if unsuccessful  Consult IR for Fistulogram  ..  3. Anemia- hgb 10.0 stable; give maintenance aranesp 60 q mon  4. Secondary hyperparathyroidism- Corr ca has been elevated even prior to hospitalization.  Changed to 2 Ca dialysate  and started sensipar 30 daily; calcitriol d/c'd.  PTH was 217 10/23, corr ca 11.4 at that time 5. HTN/volume- on midodrine pre HD. Sig wt loss, lower edw at dc 6. Nutrition- albumin noted 2.7 .  Cont supplements and multivit 7. Dementia- baseline but functional prior to acute event. 8. Multiple rib fx - stable 9. DM/hypothyroidism - meds per primary   Johnny Haber, PA-C [facility] Beeper [phone] 08/09/2018,1:11 PM  LOS: 11 days   Pt seen, examined and agree w A/P as above.  Kelly Splinter MD [facility] pager [phone]   08/09/2018, 1:26 PM    Labs: Basic Metabolic Panel: Recent Labs  Lab 08/04/18 1357 08/06/18 1326 08/08/18 1557  NA 129* 132* 137  K 3.8 3.5  4.0  CL 90* 95* 96*  CO2 23 23 28   GLUCOSE 141* 155* 167*  BUN 88* 53* 68*  CREATININE 8.26* 5.42* 6.88*  CALCIUM 10.0 9.1 10.4*  PHOS 5.4* 4.0 4.7*   Liver Function Tests: Recent Labs  Lab 08/04/18 1357 08/06/18 1326 08/08/18 1557  ALBUMIN 2.7* 2.5* 2.7*    Recent Labs  Lab 08/03/18 0633 08/04/18 1357 08/06/18 1326 08/08/18 1557  WBC 9.9 17.3* 8.8 10.6*  HGB 10.8* 10.8* 10.1* 10.0*  HCT 34.1* 33.7* 30.5* 32.0*  MCV 97.2 95.2 96.2 98.5  PLT 205 260 218 199   Cardiac Enzymes: No results for input(s): CKTOTAL, CKMB, CKMBINDEX, TROPONINI in the last 168 hours. CBG: Recent Labs  Lab 08/08/18 1206 08/08/18 1634 08/08/18 2116 08/09/18 0608 08/09/18 1118  GLUCAP 166* 159* 120* 113* 245*    Studies/Results: No results found. Medications: . sodium chloride    . sodium chloride     . Chlorhexidine Gluconate Cloth  6 each Topical Q0600  . cinacalcet  30 mg Oral Q supper  . darbepoetin (ARANESP) injection - DIALYSIS  60 mcg Intravenous Q Mon-HD  . docusate sodium  100 mg Oral BID  . donepezil  10 mg Oral QHS  . feeding supplement (NEPRO CARB STEADY)  237 mL Oral BID BM  .  feeding supplement (PRO-STAT SUGAR FREE 64)  30 mL Oral BID  . insulin aspart  0-9 Units Subcutaneous TID WC  . levothyroxine  50 mcg Oral QAC breakfast  . liraglutide  0.6 mg Subcutaneous Q0600  . memantine  10 mg Oral BID  . midodrine  10 mg Oral Q M,W,F-HD  . multivitamin  1 tablet Oral QHS  . pantoprazole  40 mg Oral Daily  . saccharomyces boulardii  250 mg Oral BID  . senna  1 tablet Oral QHS  . sevelamer carbonate  800 mg Oral Q supper  . simvastatin  20 mg Oral QHS  . vitamin B-12  1,000 mcg Oral Daily

## 2018-08-09 NOTE — Progress Notes (Signed)
Physical Therapy Session Note  Patient Details  Name: [name] MRN: [id number] Date of Birth: 1941/05/16  Today's Date: 08/09/2018 PT Individual Time: 0800-0915 PT Individual Time Calculation (min): 75 min   Short Term Goals: Week 1:  PT Short Term Goal 1 (Week 1): Pt will perform bed mobility with S PT Short Term Goal 1 - Progress (Week 1): Progressing toward goal PT Short Term Goal 2 (Week 1): Pt will perform sit >stand with consistent minA PT Short Term Goal 2 - Progress (Week 1): Met PT Short Term Goal 3 (Week 1): Pt will ambulate 150' with min guard and LRAD PT Short Term Goal 3 - Progress (Week 1): Progressing toward goal PT Short Term Goal 4 (Week 1): Pt will ascend/descend 8 steps with min guard PT Short Term Goal 4 - Progress (Week 1): Met  Skilled Therapeutic Interventions/Progress Updates:  Pt was seen bedside in the am. Pt initially fixated on test and dialysis treatment scheduled for this am. Discussed with pt's nurse, was able to clarify that test and dialysis would be sometime today but no scheduled time. Pt then willing to participate with therapy. Pt transferred supine to edge of bed with S and side rails with head of bed elevated. Pt transferred edge of bed to w/c with rolling walker and c/g to min A. Pt performed multiple sit to stand transfers with c/g and rolling walker with verbal cues. Pt performed multiple stand pivot transfers with c/g to min A with rolling walker and verbal cues. In gym treatment focused on NMR utilizing step taps and alternating step taps, 3 sets x 10 reps each. Pt ambulated 80 and 100 feet with rolling walker and c/g with verbal cues. Pt left sitting up in room with chair alarm in place and call bell within reach.  Therapy Documentation Precautions:  Precautions Precautions: Fall Precaution Comments: HD M-W-F Restrictions Weight Bearing Restrictions: No General:   Pain: Pain Assessment Pain Scale: 0-10 Pain Score: 0-No  pain  Therapy/Group: Individual Therapy  Dub Amis 08/09/2018, 10:12 AM

## 2018-08-09 NOTE — Progress Notes (Signed)
[facility] PHYSICAL MEDICINE & REHABILITATION PROGRESS NOTE  Subjective/Complaints: Patient seen laying in bed this morning.  He states he slept well overnight.  He denies complaints.  Informed by nursing regarding inability to complete HD yesterday due to?  Clot/bleeding.  Plan for?  Fistulogram today.  ROS: Denies CP, shortness of breath, nausea, vomiting, diarrhea.  Objective: Vital Signs: Blood pressure 117/67, pulse 75, temperature 98.2 F (36.8 C), temperature source Oral, resp. rate 18, weight 65 kg, SpO2 100 %. No results found. Recent Labs    08/08/18 1557  WBC 10.6*  HGB 10.0*  HCT 32.0*  PLT 199   Recent Labs    08/08/18 1557 08/09/18 1647  NA 137 135  K 4.0 3.8  CL 96* 97*  CO2 28 25  GLUCOSE 167* 99  BUN 68* 98*  CREATININE 6.88* 8.52*  CALCIUM 10.4* 10.5*    Physical Exam: BP 117/67   Pulse 75   Temp 98.2 F (36.8 C) (Oral)   Resp 18   Wt 65 kg   SpO2 100%   BMI 19.99 kg/m  Constitutional: Well-developed. NAD HENT:Bilateral crani incisions healing Eyes:EOMI. No discharge.   Cardiovascular: RRR.  No JVD. Respiratory:Effort normal.  Clear. GI: He exhibitsno distension. Bowel sounds normal.  Musculoskeletal: He exhibits noedema and tenderness. Neurological: Alert and Oriented x3, except for date of month.  Able to follow simple one step motor commands.  Motor: B/l UE 4+-5/5 proximal to distal, unchanged B/l LE: HF 4+/5, KE 4+-5/5, ADF 5/5, unchanged HOH Skin: Warm and dry. Intact.Incision site C/D/I  Assessment/Plan: 1. 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  Physiatrist is providing close team supervision and 24 hour management of active medical problems listed below.  Physiatrist and rehab team continue to assess barriers to discharge/monitor patient progress toward functional and medical goals  Care Tool:  Bathing     Body parts bathed by patient: Buttocks   Body parts bathed by helper: Buttocks, Right lower leg, Left lower leg     Bathing assist Assist Level: Total Assistance - Patient < 25%     Upper Body Dressing/Undressing Upper body dressing   What is the patient wearing?: Pull over shirt    Upper body assist Assist Level: Minimal Assistance - Patient > 75%    Lower Body Dressing/Undressing Lower body dressing      What is the patient wearing?: Underwear/pull up, Pants     Lower body assist Assist for lower body dressing: Moderate Assistance - Patient 50 - 74%     Toileting Toileting Toileting Activity did not occur (Clothing management and hygiene only): N/A (no void or bm)  Toileting assist Assist for toileting: Moderate Assistance - Patient 50 - 74%     Transfers Chair/bed transfer  Transfers assist     Chair/bed transfer assist level: Contact Guard/Touching assist     Locomotion Ambulation   Ambulation assist      Assist level: Contact Guard/Touching assist Assistive device: Walker-rolling Max distance: 100   Walk 10 feet activity   Assist     Assist level: Contact Guard/Touching assist Assistive device: Walker-rolling   Walk 50 feet activity   Assist    Assist level: Contact Guard/Touching assist Assistive device: Walker-rolling    Walk 150 feet activity   Assist Walk 150 feet activity did not occur: Safety/medical concerns(fatigue)  Assist level: Minimal Assistance - Patient > 75% Assistive device: Walker-rolling  Walk 10 feet on uneven surface  activity   Assist Walk 10 feet on uneven surfaces activity did not occur: Safety/medical concerns         Wheelchair     Assist Will patient use wheelchair at discharge?: No      Wheelchair assist level: Minimal Assistance - Patient > 75% Max wheelchair distance: 50    Wheelchair 50 feet with 2 turns activity    Assist        Assist Level: Minimal Assistance -  Patient > 75%   Wheelchair 150 feet activity     Assist            Medical Problem List and Plan: 1.Functional and cognitive deficitssecondary to bi-frontal-temporal SDH's status post bur holes on 10/29.   Continue CIR  CT reviewed, stable on 11/7 2. DVT Prophylaxis/Anticoagulation: Mechanical:Sequential compression devices, below kneeBilateral lower extremities 3. Pain Management:tylenol pron 4. Mood:LCSW to follow for evaluation and support. 5. Neuropsych: This patientis notcapable of making decisions on hisown behalf. 6. Skin/Wound Care:routine pressure relief measures. 7. Fluids/Electrolytes/Nutrition:Monitor I/Os. Labs with HD 8. ESRD: HD MWF at end of the day to help with tolerance of therapy.  9. CAD: Stable  10 T2DM: Monitor BS ac/hs and use SSI for elevated BS.Was on Victoza at home due to multiple hypoglycemic episodes with long acting insulin.  Labile on 11/16  Cont SSI CBG (last 3)  Recent Labs    08/08/18 2116 08/09/18 0608 08/09/18 1118  GLUCAP 120* 113* 245*   11. Seizure prophylaxis:off  Keppra  12. H/O IBS: Discontinued laxatives due to reports of incontinence due to diarrhea:  13. Dementia: On Nameda and Aricept. 14. Loose stools. Wife reports history of cycling loose and then constipated stools PTA.   Senna started on 11/7 Added probiotic   Improving overall 15.  Labile blood pressure- proamatine ordered for HD days   Vitals:   08/09/18 1600 08/09/18 1630  BP: (!) 101/52 117/67  Pulse: 70 75  Resp: 18 18  Temp:    SpO2:     Labile on 11/16 16.  Anemia of chronic disease  Hemoglobin 10.1 on 11/13  Hemoccult positive  Labs with HD today  Continue to monitor 17.  Leukocytosis  WBCs 8.8 on 11/13  Labs with HD  Afebrile  Continue to monitor  LOS: 11 days A FACE TO FACE EVALUATION WAS PERFORMED  Ankit [name] 08/09/2018, 5:45 PM

## 2018-08-10 ENCOUNTER — Inpatient Hospital Stay (HOSPITAL_COMMUNITY): Payer: Medicare Other

## 2018-08-10 ENCOUNTER — Inpatient Hospital Stay (HOSPITAL_COMMUNITY): Payer: Medicare Other | Admitting: Physical Therapy

## 2018-08-10 DIAGNOSIS — D72829 Elevated white blood cell count, unspecified: Secondary | ICD-10-CM

## 2018-08-10 LAB — GLUCOSE, CAPILLARY
Glucose-Capillary: 112 mg/dL — ABNORMAL HIGH (ref 70–99)
Glucose-Capillary: 191 mg/dL — ABNORMAL HIGH (ref 70–99)
Glucose-Capillary: 167 mg/dL — ABNORMAL HIGH (ref 70–99)
Glucose-Capillary: 143 mg/dL — ABNORMAL HIGH (ref 70–99)

## 2018-08-10 MED ORDER — DOCUSATE SODIUM 100 MG PO CAPS
100.0000 mg | ORAL_CAPSULE | Freq: Every day | ORAL | Status: DC
  Administered 2018-08-10 – 2018-08-13 (×4): 100 mg via ORAL
  Filled 2018-08-10 (×4): qty 1

## 2018-08-10 MED ORDER — POLYETHYLENE GLYCOL 3350 17 G PO PACK
17.0000 g | PACK | Freq: Every day | ORAL | Status: DC
  Administered 2018-08-11 – 2018-08-13 (×2): 17 g via ORAL
  Filled 2018-08-10 (×4): qty 1

## 2018-08-10 NOTE — Progress Notes (Signed)
Physical Therapy Session Note  Patient Details  Name: [name] MRN: [id number] Date of Birth: [birth year]  Today's Date: 08/10/2018 PT Individual Time: 0800-0900 PT Individual Time Calculation (min): 60 min   Short Term Goals: Week 1:  PT Short Term Goal 1 (Week 1): Pt will perform bed mobility with S PT Short Term Goal 1 - Progress (Week 1): Progressing toward goal PT Short Term Goal 2 (Week 1): Pt will perform sit >stand with consistent minA PT Short Term Goal 2 - Progress (Week 1): Met PT Short Term Goal 3 (Week 1): Pt will ambulate 150' with min guard and LRAD PT Short Term Goal 3 - Progress (Week 1): Progressing toward goal PT Short Term Goal 4 (Week 1): Pt will ascend/descend 8 steps with min guard PT Short Term Goal 4 - Progress (Week 1): Met  Skilled Therapeutic Interventions/Progress Updates:  Pt was seen bedside in the am. Pt willing to participate with therapy. Pt transferred supine to edge of bed with side rail and S with increased time. Pt transferred edge of bed to w/c with rolling walker and S with verbal cues for safety. Pt transported to rehab gym. Pt performed multiple transfers with rolling walker and S to c/g, increased assistance noted as level of fatigue increased as well as increased cues. Pt ambulated 75 feet x 2 with rolling walker and c/g with verbal cues. Pt ascended/descended 8 stairs with B rails and c/g with verbal cues. Pt requested to utilize the bathroom. Pt returned to room. Pt completed toilet transfers with rolling walker and c/g. Pt dependent for hygiene. After using the bathroom, pt c/o fatigue and pain over top of head. Pt requested to return to bed. Pt transferred w/c to edge of bed with rolling walker and c/g. Pt transferred edge of bed to supine with side rail and S with increased time. Once pt in bed no complaints of pain over top of head. Pt states he was feeling good just fatigued. Notified pt's nurse of complaints. Pt left sitting up in bed with  call bell within reach and bed alarm on.   Therapy Documentation Precautions:  Precautions Precautions: Fall Precaution Comments: HD M-W-F Restrictions Weight Bearing Restrictions: No General: PT Amount of Missed Time (min): 15 Minutes PT Missed Treatment Reason: Patient fatigue;Pain Pain: Pain Assessment Pain Scale: 0-10 Pain Score: 0-No pain  Therapy/Group: Individual Therapy  Dub Amis 08/10/2018, 9:03 AM

## 2018-08-10 NOTE — Progress Notes (Addendum)
Subjective:  Cos mild constipation , Tolerated HD yest. Without Cannulation problem/ HD dialyzer clotted with 47min left (no hep 2/2SDH)  Objective Vital signs in last 24 hours: Vitals:   08/09/18 1931 08/10/18 0615 08/10/18 0618 08/10/18 0620  BP: (!) 102/34 (!) 118/31 (!) 111/52 (!) 79/58  Pulse: 80 76 78 100  Resp: 16 18 18 18   Temp: 97.8 F (36.6 C) 98.5 F (36.9 C)    TempSrc: Oral Oral    SpO2: 100% 100% 100% 100%  Weight:    64 kg   Weight change: 1.2 kg  Physical Exam: General: alert elderly WM , NAD  Heart: RRR, no m,r,g Lungs: CTA  Abdomen: soft,Nt, Nd  Extremities: no pedal edema Dialysis Access: RUA AVF pos.  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 Bilat SDH sp hematoma evacuation - on CIR now (NO HEP HD) 2. ESRD- MWF. HD yesterday without problems ,noted Fri 11/15 AVF bleed with initial cannulation .K 3.8 yest  Nest hd tomor mon on schedule / no fleet enema with esrd use mira lax and colace , dulcolax  3. Anemia- hgb 10.0 stable; give maintenance aranesp 60q mon  4. Secondary hyperparathyroidism- Corr ca 11.8 ,has been elevated even prior to hospitalization./  Phos 5.7 Changed to 2 Ca dialysate and started sensipar 30 daily; calcitriol d/c'd.PTH was 217 10/23./  5. HTN/volume-on midodrine pre HD. Sig wt loss, lower edw at dc 6. Nutrition- albumin noted 2.6 . Cont supplements and multivit 7. Dementia- baseline but functional prior to acute event. 8. Multiple rib fx - stable 9. DM/hypothyroidism - meds per primary  Ernest Haber, PA-C [facility] [phone] 08/10/2018,9:03 AM  LOS: 12 days   Pt seen, examined and agree w A/P as above.  Kelly Splinter MD [facility] pager [phone]   08/10/2018, 1:02 PM    Labs: Basic Metabolic Panel: Recent  Labs  Lab 08/06/18 1326 08/08/18 1557 08/09/18 1647  NA 132* 137 135  K 3.5 4.0 3.8  CL 95* 96* 97*  CO2 23 28 25   GLUCOSE 155* 167* 99  BUN 53* 68* 98*  CREATININE 5.42* 6.88* 8.52*  CALCIUM 9.1 10.4* 10.5*  PHOS 4.0 4.7* 5.7*   Liver Function Tests: Recent Labs  Lab 08/06/18 1326 08/08/18 1557 08/09/18 1647  ALBUMIN 2.5* 2.7* 2.6*   No results for input(s): LIPASE, AMYLASE in the last 168 hours. No results for input(s): AMMONIA in the last 168 hours. CBC: Recent Labs  Lab 08/04/18 1357 08/06/18 1326 08/08/18 1557  WBC 17.3* 8.8 10.6*  HGB 10.8* 10.1* 10.0*  HCT 33.7* 30.5* 32.0*  MCV 95.2 96.2 98.5  PLT 260 218 199   Cardiac Enzymes: No results for input(s): CKTOTAL, CKMB, CKMBINDEX, TROPONINI in the last 168 hours. CBG: Recent Labs  Lab 08/09/18 0608 08/09/18 1118 08/09/18 1927 08/09/18 2134 08/10/18 0629  GLUCAP 113* 245* 218* 233* 112*    Studies/Results: No results found. Medications:  . Chlorhexidine Gluconate Cloth  6 each Topical Q0600  . cinacalcet  30 mg Oral Q supper  . darbepoetin (ARANESP) injection - DIALYSIS  60 mcg Intravenous Q Mon-HD  . docusate sodium  100 mg Oral BID  . donepezil  10 mg Oral QHS  . feeding supplement (NEPRO CARB STEADY)  237 mL Oral BID BM  . feeding  supplement (PRO-STAT SUGAR FREE 64)  30 mL Oral BID  . insulin aspart  0-9 Units Subcutaneous TID WC  . levothyroxine  50 mcg Oral QAC breakfast  . liraglutide  0.6 mg Subcutaneous Q0600  . memantine  10 mg Oral BID  . midodrine  10 mg Oral Q M,W,F-HD  . multivitamin  1 tablet Oral QHS  . pantoprazole  40 mg Oral Daily  . saccharomyces boulardii  250 mg Oral BID  . senna  1 tablet Oral QHS  . sevelamer carbonate  800 mg Oral Q supper  . simvastatin  20 mg Oral QHS  . vitamin B-12  1,000 mcg Oral Daily

## 2018-08-10 NOTE — Progress Notes (Signed)
[place] PHYSICAL MEDICINE & REHABILITATION PROGRESS NOTE  Subjective/Complaints: Patient seen laying in bed this AM.  He states he did not sleep well overnight, but cannot identify  Reason why.  He is aware of his planned discharge in 3 days.  Later, notified by nursing regarding symptomatic orthostasis.   ROS: Denies CP, shortness of breath, nausea, vomiting, diarrhea.  Objective: Vital Signs: Blood pressure (!) 92/52, pulse 72, temperature 98.4 F (36.9 C), resp. rate 16, weight 64 kg, SpO2 99 %. No results found. Recent Labs    08/08/18 1557  WBC 10.6*  HGB 10.0*  HCT 32.0*  PLT 199   Recent Labs    08/08/18 1557 08/09/18 1647  NA 137 135  K 4.0 3.8  CL 96* 97*  CO2 28 25  GLUCOSE 167* 99  BUN 68* 98*  CREATININE 6.88* 8.52*  CALCIUM 10.4* 10.5*    Physical Exam: BP (!) 92/52 (BP Location: Left Arm)   Pulse 72   Temp 98.4 F (36.9 C)   Resp 16   Wt 64 kg   SpO2 99%   BMI 19.68 kg/m  Constitutional: Well-developed. NAD HENT:Bilateral crani incisions healing Eyes:EOMI. No discharge.   Cardiovascular: RRR. No JVD. Respiratory:Effort normal. Clear. GI: He exhibitsno distension. Bowel sounds normal.  Musculoskeletal: He exhibits noedema and tenderness. Neurological: Alert and Oriented x3, except for date of month.  Able to follow simple one step motor commands.  Motor: B/l UE 4+-5/5 proximal to distal, unchanged B/l LE: HF 4+/5, KE 4+-5/5, ADF 5/5, unchanged HOH Skin: Warm and dry. Intact.Incision site C/D/I  Assessment/Plan: 1. 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  Physiatrist is providing close team supervision and 24 hour management of active medical problems listed below.  Physiatrist and rehab team continue to assess barriers to discharge/monitor patient progress toward functional and medical goals  Care Tool:  Bathing     Body parts bathed by patient: Buttocks   Body parts bathed by helper: Buttocks, Right lower leg, Left lower leg     Bathing assist Assist Level: Total Assistance - Patient < 25%     Upper Body Dressing/Undressing Upper body dressing   What is the patient wearing?: Pull over shirt    Upper body assist Assist Level: Minimal Assistance - Patient > 75%    Lower Body Dressing/Undressing Lower body dressing      What is the patient wearing?: Underwear/pull up, Pants     Lower body assist Assist for lower body dressing: Moderate Assistance - Patient 50 - 74%     Toileting Toileting Toileting Activity did not occur (Clothing management and hygiene only): N/A (no void or bm)  Toileting assist Assist for toileting: Moderate Assistance - Patient 50 - 74%     Transfers Chair/bed transfer  Transfers assist     Chair/bed transfer assist level: Contact Guard/Touching assist     Locomotion Ambulation   Ambulation assist      Assist level: Contact Guard/Touching assist Assistive device: Walker-rolling Max distance: 75   Walk 10 feet activity   Assist     Assist level: Contact Guard/Touching assist Assistive device: Walker-rolling   Walk 50 feet activity   Assist    Assist level: Contact Guard/Touching assist Assistive device: Walker-rolling    Walk 150 feet activity   Assist Walk 150 feet activity did not occur: Safety/medical concerns(fatigue)  Assist level: Minimal Assistance - Patient > 75% Assistive device:  Walker-rolling    Walk 10 feet on uneven surface  activity   Assist Walk 10 feet on uneven surfaces activity did not occur: Safety/medical concerns         Wheelchair     Assist Will patient use wheelchair at discharge?: No      Wheelchair assist level: Minimal Assistance - Patient > 75% Max wheelchair distance: 50    Wheelchair 50 feet with 2 turns activity    Assist        Assist Level: Minimal Assistance -  Patient > 75%   Wheelchair 150 feet activity     Assist            Medical Problem List and Plan: 1.Functional and cognitive deficitssecondary to bi-frontal-temporal SDH's status post bur holes on 10/29.   Continue CIR  CT reviewed, stable on 11/7 2. DVT Prophylaxis/Anticoagulation: Mechanical:Sequential compression devices, below kneeBilateral lower extremities 3. Pain Management:tylenol pron 4. Mood:LCSW to follow for evaluation and support. 5. Neuropsych: This patientis notcapable of making decisions on hisown behalf. 6. Skin/Wound Care:routine pressure relief measures. 7. Fluids/Electrolytes/Nutrition:Monitor I/Os. Labs with HD 8. ESRD: HD MWF at end of the day to help with tolerance of therapy.  Appreciate Nephro recs, notes reviewed.  9. CAD: Stable  10 T2DM: Monitor BS ac/hs and use SSI for elevated BS.Was on Victoza at home due to multiple hypoglycemic episodes with long acting insulin.  Labile on 11/17, monitor for trend  Cont SSI CBG (last 3)  Recent Labs    08/09/18 2134 08/10/18 0629 08/10/18 1210  GLUCAP 233* 112* 191*   11. Seizure prophylaxis:off  Keppra  12. H/O IBS: Discontinued laxatives due to reports of incontinence due to diarrhea:  13. Dementia: On Nameda and Aricept. 14. Loose stools. Wife reports history of cycling loose and then constipated stools PTA.   Senna started on 11/7 Added probiotic   Improving overall 15.  Labile blood pressure- proamatine ordered for HD days   Vitals:   08/10/18 0620 08/10/18 1326  BP: (!) 79/58 (!) 92/52  Pulse: 100 72  Resp: 18 16  Temp:  98.4 F (36.9 C)  SpO2: 100% 99%   Labile on 11/17 with HD 16.  Anemia of chronic disease  Hemoglobin 10.0 on 11/15  Hemoccult positive  Continue to monitor 17.  Leukocytosis  WBCs 10.6 on 11/15  Labs with HD  Afebrile  Continue to monitor  LOS: 12 days A FACE TO FACE EVALUATION WAS PERFORMED  [name] [name] 08/10/2018, 2:08  PM

## 2018-08-10 NOTE — Progress Notes (Signed)
Occupational Therapy Session Note  Patient Details  Name: [name] MRN: [id number] Date of Birth: 27-Mar-1941  Today's Date: 08/10/2018 OT Individual Time: 1110-1210 OT Individual Time Calculation (min): 60 min    Short Term Goals: Week 1:  OT Short Term Goal 1 (Week 1): Pt will transfer with min A to and from the BSC/ toilet with LRAD OT Short Term Goal 2 (Week 1): Pt will be able to don shirt from folded position with min A with VC for problem solving OT Short Term Goal 3 (Week 1): Pt will don LB clothing with mod A sit to stand including slide on shoes  OT Short Term Goal 4 (Week 1): Pt will stand to perform 2 grooming tasks at sink with min A; showing improve endurance  Skilled Therapeutic Interventions/Progress Updates:    1:1. Pt requires max encouragement to participate after OT gives pt 5 min rest at beginnin gof session prior to starting. Pt requires max VC for safe hand placemetn for stand pivot transfer EOB>w/c with supervision. Pt compeltes regular bed transfer w/c<>EOB with RW at ambulatory level. Pt able to complete bed mobility with supervision on regular bed. Pt verbose and requires VC for redirection to task. Attmepted to engage pt in standing on compliant foam wedge during cognitive task, however reports increased back pain in this position as well as unsupported sitting EOM. Pt completes easter egg sort in carton to match picture in supported sitting in w/c with MAX question cues/gestures d/t decrease exectuive function. Returned pt to room, and exited with pt sitting EOB with NT checking blood sugar  Therapy Documentation Precautions:  Precautions Precautions: Fall Precaution Comments: HD M-W-F Restrictions Weight Bearing Restrictions: No General: General PT Missed Treatment Reason: Patient fatigue;Pain   Therapy/Group: Individual Therapy  Tonny Branch 08/10/2018, 11:51 AM

## 2018-08-11 ENCOUNTER — Inpatient Hospital Stay (HOSPITAL_COMMUNITY): Payer: Medicare Other

## 2018-08-11 ENCOUNTER — Inpatient Hospital Stay (HOSPITAL_COMMUNITY): Payer: Medicare Other | Admitting: Physical Therapy

## 2018-08-11 ENCOUNTER — Inpatient Hospital Stay (HOSPITAL_COMMUNITY): Payer: Medicare Other | Admitting: Occupational Therapy

## 2018-08-11 LAB — CBC
HCT: 25.3 % — ABNORMAL LOW (ref 39.0–52.0)
Hemoglobin: 8.2 g/dL — ABNORMAL LOW (ref 13.0–17.0)
MCH: 31.5 pg (ref 26.0–34.0)
MCHC: 32.4 g/dL (ref 30.0–36.0)
MCV: 97.3 fL (ref 80.0–100.0)
Platelets: 206 10*3/uL (ref 150–400)
RBC: 2.6 MIL/uL — ABNORMAL LOW (ref 4.22–5.81)
RDW: 15.5 % (ref 11.5–15.5)
WBC: 9.4 10*3/uL (ref 4.0–10.5)
nRBC: 0 % (ref 0.0–0.2)

## 2018-08-11 LAB — BASIC METABOLIC PANEL
Anion gap: 13 (ref 5–15)
BUN: 80 mg/dL — ABNORMAL HIGH (ref 8–23)
CO2: 24 mmol/L (ref 22–32)
Calcium: 10.2 mg/dL (ref 8.9–10.3)
Chloride: 97 mmol/L — ABNORMAL LOW (ref 98–111)
Creatinine, Ser: 7.94 mg/dL — ABNORMAL HIGH (ref 0.61–1.24)
GFR calc Af Amer: 7 mL/min — ABNORMAL LOW (ref >60)
GFR calc non Af Amer: 6 mL/min — ABNORMAL LOW (ref >60)
Glucose, Bld: 186 mg/dL — ABNORMAL HIGH (ref 70–99)
Potassium: 3.6 mmol/L (ref 3.5–5.1)
Sodium: 134 mmol/L — ABNORMAL LOW (ref 135–145)

## 2018-08-11 LAB — GLUCOSE, CAPILLARY
Glucose-Capillary: 114 mg/dL — ABNORMAL HIGH (ref 70–99)
Glucose-Capillary: 200 mg/dL — ABNORMAL HIGH (ref 70–99)
Glucose-Capillary: 194 mg/dL — ABNORMAL HIGH (ref 70–99)
Glucose-Capillary: 111 mg/dL — ABNORMAL HIGH (ref 70–99)

## 2018-08-11 NOTE — Progress Notes (Signed)
Occupational Therapy Session Note  Patient Details  Name: [name] MRN: [id number] Date of Birth: 10/07/1940  Today's Date: 08/11/2018 OT Individual Time: 1100-1200 OT Individual Time Calculation (min): 60 min    Short Term Goals: Week 2:     Skilled Therapeutic Interventions/Progress Updates:    Patient finished PT session in therapy gym prior to OT session today.  He states that he is a little tired but okay to begin next session.  He denies pain but c/o lower back discomfort at times with transitional positions which does not persist. Functional mobility/transfers:  Bed and mat mobility (SSP to/from supine)  Able to transition with CS/CG and cues for technique from flat surface.  SPT with RW to/from bed, mat, w/c CG/CS and occ cues for hand placement.  Patient ambulated in hallway for approx 50 feet with CS and cues. Therapeutic activity:  Completed both supine and SSP UE reach activities without resistance, ergometer in SSP 2 x 4 minutes, light trunk stretching without complaints. Patient continues to have impaired recall and insight limiting his safety.  He demonstrated improved tolerance for mobility activities today and states that he is eager to go home this week.  Patient returned to bed with alarm set to eat his lunch.  Therapy Documentation Precautions:  Precautions Precautions: Fall Precaution Comments: HD M-W-F Restrictions Weight Bearing Restrictions: No General:   Vital Signs:  Pain: Pain Assessment Pain Scale: 0-10 Pain Score: 0-No pain A    Therapy/Group: Individual Therapy  Carlos Levering 08/11/2018, 12:16 PM

## 2018-08-11 NOTE — Progress Notes (Addendum)
Speech Language Pathology Daily Session Note  Patient Details  Name: [name] MRN: [id number] Date of Birth: 03/19/1941  Today's Date: 08/11/2018 SLP Individual Time: 1324-4010 SLP Individual Time Calculation (min): 45 min  Short Term Goals: Week 2: SLP Short Term Goal 1 (Week 2): Pt will utilize external aids to recall daily information with min assist verbal cues.   SLP Short Term Goal 2 (Week 2): Pt will complete mildly complex tasks with min assist for functional problem solving.   SLP Short Term Goal 3 (Week 2): Pt will recognize and correct errors in the moment during functional tasks with min assist verbal cues.    Skilled Therapeutic Interventions:Skilled ST services focused on cognitive skills. SLP facilitated recall of requested medication (gabapentin), pt heard it helped with "itching", pt required mod-min A verbal cues for external aid to recall medication and present to nurse. SLP facilitated mildly complex problem solving utilizing money management pt required mod A fade to min A verbal cues and recognize/correct errors. SLP facilitated problem solving utilizing novel card task, at simplest level pt required min A verbal cues and mod-min A verbal cues to recall rules with external aid. Pt was left in room with call bell within reach and bed alarm set. Recommend to continue skilled ST services.       Pain Pain Assessment Pain Scale: 0-10 Pain Score: 0-No pain  Therapy/Group: Individual Therapy  Carolie Mcilrath  [facility] 08/11/2018, 12:37 PM

## 2018-08-11 NOTE — Progress Notes (Signed)
Physical Therapy Session Note  Patient Details  Name: [name] MRN: [id number] Date of Birth: Jul 16, 1941  Today's Date: 08/11/2018 PT Individual Time: 1000-1100 PT Individual Time Calculation (min): 60 min   Short Term Goals: Week 2:  PT Short Term Goal 1 (Week 2): =LTG at S overall due to estimated LOS  Skilled Therapeutic Interventions/Progress Updates: Pt received seated in bed, denies pain and agreeable to treatment. Supine>sit with S, increased time to initiate. Stand pivot transfer to w/c with CGA. W/c propulsion BUE x75' for strengthening and aerobic endurance; moderate cueing for technique. Gait with RW and min guard stepping over trekking poles, maneuvering around cones for focus on obstacle negotiation and direction changes; requires moderate cueing to recall full turn to chair prior to sitting, maintaining RW in front of body, reaching back and pushing up with one UE for sit <>stand. Returned to room totalA d/t urgency to void. Stand pivot w/c <>BSC over toilet with RW and min guard, maxA for clothing management and hygiene. Returned to gym [name]. Stand pivot to mat table min guard no AD. Long sitting hamstring stretch 2x1 min; note increased thoracic kyphosis with position and increased complaints of back pain. Supine bridging x10 reps. Handoff to OT at end of session, all needs in reach.      Therapy Documentation Precautions:  Precautions Precautions: Fall Precaution Comments: HD M-W-F Restrictions Weight Bearing Restrictions: No Pain: Pain Assessment Pain Scale: 0-10 Pain Score: 0-No pain    Therapy/Group: Individual Therapy  Corliss Skains 08/11/2018, 12:03 PM

## 2018-08-11 NOTE — Progress Notes (Signed)
Subjective:   Seen in hallway doing PT No further cannulation issues on HD   Objective Vital signs in last 24 hours: Vitals:   08/10/18 1326 08/10/18 2346 08/11/18 0522 08/11/18 0714  BP: (!) 92/52 (!) 133/46 (!) 64/26   Pulse: 72 72 72   Resp: 16 18 19    Temp: 98.4 F (36.9 C) 99.1 F (37.3 C) 98.5 F (36.9 C)   TempSrc:  Oral    SpO2: 99%  96%   Weight:    67.6 kg   Weight change:   Physical Exam: General: alert elderly WM , NAD  Heart: RRR, no m,r,g Lungs: CTA  Abdomen: soft,Nt, Nd  Extremities: no pedal edema Dialysis Access: RUA AVF +bruit     OP Dialysis Orders:MWF [facility] 4h 71kg 2/2 bath R AVF Hep none (due to subdural bleed) -Mircera 150 mcg IV q 2 weeks (last dose 07/16/18 Last HGB 11.4 07/16/18)   Problem/Plan: 1. Bilat SDH sp hematoma evacuation - on CIR now (NO HEP HD) 2. ESRD- MWF. No further cannulation issues since 11/15.  HD today on schedule. / no fleet enema with esrd use mira lax and colace , dulcolax  3. Anemia- hgb 10.0 stable.  Maintenance aranesp 60q mon  4. Secondary hyperparathyroidism- Corr ca 11.8 ,has been elevated even prior to hospitalization./  Phos 5.7 Changed to 2 Ca dialysate and started sensipar 30 daily; calcitriol d/c'd.PTH was 217 10/23./  5. HTN/volume-on midodrine pre HD. Sig wt loss, lower edw at dc 6. Nutrition- albumin noted 2.6 . Cont supplements and multivit 7. Dementia- baseline but functional prior to acute event. 8. Multiple rib fx - stable 9. DM/hypothyroidism - meds per primary  Lynnda Child PA-C [place] Pager [phone] 08/11/2018,10:30 AM     Labs: Basic Metabolic Panel: Recent Labs  Lab 08/06/18 1326 08/08/18 1557 08/09/18 1647  NA 132* 137 135  K 3.5 4.0 3.8  CL 95* 96* 97*  CO2 23 28 25   GLUCOSE 155* 167* 99  BUN 53* 68* 98*  CREATININE 5.42* 6.88* 8.52*  CALCIUM 9.1 10.4* 10.5*  PHOS 4.0 4.7* 5.7*   Liver Function Tests: Recent Labs  Lab  08/06/18 1326 08/08/18 1557 08/09/18 1647  ALBUMIN 2.5* 2.7* 2.6*   No results for input(s): LIPASE, AMYLASE in the last 168 hours. No results for input(s): AMMONIA in the last 168 hours. CBC: Recent Labs  Lab 08/04/18 1357 08/06/18 1326 08/08/18 1557  WBC 17.3* 8.8 10.6*  HGB 10.8* 10.1* 10.0*  HCT 33.7* 30.5* 32.0*  MCV 95.2 96.2 98.5  PLT 260 218 199   Cardiac Enzymes: No results for input(s): CKTOTAL, CKMB, CKMBINDEX, TROPONINI in the last 168 hours. CBG: Recent Labs  Lab 08/10/18 0629 08/10/18 1210 08/10/18 1654 08/10/18 2124 08/11/18 0629  GLUCAP 112* 191* 167* 143* 114*    Studies/Results: No results found. Medications:  . Chlorhexidine Gluconate Cloth  6 each Topical Q0600  . cinacalcet  30 mg Oral Q supper  . darbepoetin (ARANESP) injection - DIALYSIS  60 mcg Intravenous Q Mon-HD  . docusate sodium  100 mg Oral Daily  . donepezil  10 mg Oral QHS  . feeding supplement (NEPRO CARB STEADY)  237 mL Oral BID BM  . feeding supplement (PRO-STAT SUGAR FREE 64)  30 mL Oral BID  . insulin aspart  0-9 Units Subcutaneous TID WC  . levothyroxine  50 mcg Oral QAC breakfast  . liraglutide  0.6 mg Subcutaneous Q0600  . memantine  10 mg Oral BID  .  midodrine  10 mg Oral Q M,W,F-HD  . multivitamin  1 tablet Oral QHS  . pantoprazole  40 mg Oral Daily  . polyethylene glycol  17 g Oral Daily  . saccharomyces boulardii  250 mg Oral BID  . senna  1 tablet Oral QHS  . sevelamer carbonate  800 mg Oral Q supper  . simvastatin  20 mg Oral QHS  . vitamin B-12  1,000 mcg Oral Daily

## 2018-08-11 NOTE — Procedures (Signed)
Patient seen and examined on Hemodialysis. QB 400 mL/ min via AVF, UF goal 2.5L.  Doing well, tolerating rx well.  Hopeful for d/c on Wednesday.   Treatment adjusted as needed.  Madelon Lips MD [facility] pgr [phone] 3:29 PM

## 2018-08-11 NOTE — Progress Notes (Signed)
[place] PHYSICAL MEDICINE & REHABILITATION PROGRESS NOTE  Subjective/Complaints: Patient seen laying in bed this AM.  He states he slept well overnight.  He is confused and thought today was his discharge date.  ROS: Denies CP, shortness of breath, nausea, vomiting, diarrhea.  Objective: Vital Signs: Blood pressure (!) 64/26, pulse 72, temperature 98.5 F (36.9 C), resp. rate 19, weight 67.6 kg, SpO2 96 %. No results found. Recent Labs    08/08/18 1557  WBC 10.6*  HGB 10.0*  HCT 32.0*  PLT 199   Recent Labs    08/08/18 1557 08/09/18 1647  NA 137 135  K 4.0 3.8  CL 96* 97*  CO2 28 25  GLUCOSE 167* 99  BUN 68* 98*  CREATININE 6.88* 8.52*  CALCIUM 10.4* 10.5*    Physical Exam: BP (!) 64/26 (BP Location: Left Arm)   Pulse 72   Temp 98.5 F (36.9 C)   Resp 19   Wt 67.6 kg   SpO2 96%   BMI 20.80 kg/m  Constitutional: Well-developed. NAD HENT:Bilateral crani incisions healing Eyes:EOMI. No discharge.   Cardiovascular: RRR.  No JVD. Respiratory:Effort normal.  Clear. GI: He exhibitsno distension. Bowel sounds normal.  Musculoskeletal: He exhibits noedema and tenderness. Neurological: Alert and Oriented x3, except for date of month.  Able to follow simple one step motor commands.  Motor: B/l UE 4+-5/5 proximal to distal, unchanged B/l LE: HF 4+/5, KE 4+-5/5, ADF 5/5, unchanged HOH Skin: Warm and dry. Intact.Incision site C/D/I  Assessment/Plan: 1. 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  Physiatrist is providing close team supervision and 24 hour management of active medical problems listed below.  Physiatrist and rehab team continue to assess barriers to discharge/monitor patient progress toward functional and medical goals  Care Tool:  Bathing    Body parts bathed by patient: Buttocks   Body parts bathed by helper: Buttocks, Right  lower leg, Left lower leg     Bathing assist Assist Level: Total Assistance - Patient < 25%     Upper Body Dressing/Undressing Upper body dressing   What is the patient wearing?: Pull over shirt    Upper body assist Assist Level: Minimal Assistance - Patient > 75%    Lower Body Dressing/Undressing Lower body dressing      What is the patient wearing?: Underwear/pull up, Pants     Lower body assist Assist for lower body dressing: Moderate Assistance - Patient 50 - 74%     Toileting Toileting Toileting Activity did not occur (Clothing management and hygiene only): N/A (no void or bm)  Toileting assist Assist for toileting: Moderate Assistance - Patient 50 - 74%     Transfers Chair/bed transfer  Transfers assist     Chair/bed transfer assist level: Contact Guard/Touching assist     Locomotion Ambulation   Ambulation assist      Assist level: Contact Guard/Touching assist Assistive device: Walker-rolling Max distance: 75   Walk 10 feet activity   Assist     Assist level: Contact Guard/Touching assist Assistive device: Walker-rolling   Walk 50 feet activity   Assist    Assist level: Contact Guard/Touching assist Assistive device: Walker-rolling    Walk 150 feet activity   Assist Walk 150 feet activity did not occur: Safety/medical concerns(fatigue)  Assist level: Minimal Assistance - Patient > 75% Assistive device: Walker-rolling    Walk 10 feet on uneven surface  activity   Assist  Walk 10 feet on uneven surfaces activity did not occur: Safety/medical concerns         Wheelchair     Assist Will patient use wheelchair at discharge?: No      Wheelchair assist level: Minimal Assistance - Patient > 75% Max wheelchair distance: 50    Wheelchair 50 feet with 2 turns activity    Assist        Assist Level: Minimal Assistance - Patient > 75%   Wheelchair 150 feet activity     Assist            Medical  Problem List and Plan: 1.Functional and cognitive deficitssecondary to bi-frontal-temporal SDH's status post bur holes on 10/29.   Continue CIR  CT reviewed, stable on 11/7 2. DVT Prophylaxis/Anticoagulation: Mechanical:Sequential compression devices, below kneeBilateral lower extremities 3. Pain Management:tylenol pron 4. Mood:LCSW to follow for evaluation and support. 5. Neuropsych: This patientis notcapable of making decisions on hisown behalf. 6. Skin/Wound Care:routine pressure relief measures. 7. Fluids/Electrolytes/Nutrition:Monitor I/Os. Labs with HD 8. ESRD: HD MWF at end of the day to help with tolerance of therapy.  Appreciate Nephro recs, notes reviewed.  9. CAD: Stable  10 T2DM: Monitor BS ac/hs and use SSI for elevated BS.Was on Victoza at home due to multiple hypoglycemic episodes with long acting insulin.  Labile on 11/18  Cont SSI CBG (last 3)  Recent Labs    08/10/18 1654 08/10/18 2124 08/11/18 0629  GLUCAP 167* 143* 114*   11. Seizure prophylaxis:off  Keppra  12. H/O IBS: Discontinued laxatives due to reports of incontinence due to diarrhea:  13. Dementia: On Nameda and Aricept. 14. Loose stools. Wife reports history of cycling loose and then constipated stools PTA.   Senna started on 11/7 Added probiotic   Improving overall 15.  Labile blood pressure- proamatine ordered for HD days   Vitals:   08/10/18 2346 08/11/18 0522  BP: (!) 133/46 (!) 64/26  Pulse: 72 72  Resp: 18 19  Temp: 99.1 F (37.3 C) 98.5 F (36.9 C)  SpO2:  96%   Labile on 11/18 with HD 16.  Anemia of chronic disease  Hemoglobin 10.0 on 11/15  Hemoccult positive  Continue to monitor 17.  Leukocytosis  WBCs 10.6 on 11/15  Labs with HD  Afebrile  Continue to monitor  LOS: 13 days A FACE TO FACE EVALUATION WAS PERFORMED  Koehn Salehi [name] 08/11/2018, 8:30 AM

## 2018-08-11 NOTE — Progress Notes (Signed)
Restful throughout shift, continue sleep chart, bed alarms on, floor mats in place

## 2018-08-12 ENCOUNTER — Inpatient Hospital Stay (HOSPITAL_COMMUNITY): Payer: Medicare Other | Admitting: Occupational Therapy

## 2018-08-12 ENCOUNTER — Inpatient Hospital Stay (HOSPITAL_COMMUNITY): Payer: Medicare Other | Admitting: Physical Therapy

## 2018-08-12 ENCOUNTER — Inpatient Hospital Stay (HOSPITAL_COMMUNITY): Payer: Medicare Other | Admitting: Speech Pathology

## 2018-08-12 LAB — GLUCOSE, CAPILLARY
Glucose-Capillary: 156 mg/dL — ABNORMAL HIGH (ref 70–99)
Glucose-Capillary: 130 mg/dL — ABNORMAL HIGH (ref 70–99)
Glucose-Capillary: 115 mg/dL — ABNORMAL HIGH (ref 70–99)
Glucose-Capillary: 78 mg/dL (ref 70–99)

## 2018-08-12 LAB — OCCULT BLOOD X 1 CARD TO LAB, STOOL: Fecal Occult Bld: POSITIVE — AB

## 2018-08-12 MED ORDER — DARBEPOETIN ALFA 60 MCG/0.3ML IJ SOSY
60.0000 ug | PREFILLED_SYRINGE | Freq: Once | INTRAMUSCULAR | Status: AC
  Administered 2018-08-12: 60 ug via SUBCUTANEOUS
  Filled 2018-08-12: qty 0.3

## 2018-08-12 NOTE — Progress Notes (Signed)
Occupational Therapy Discharge Summary  Patient Details  Name: [name] MRN: [id number] Date of Birth: 1941/05/12  Today's Date: 08/12/2018 OT Individual Time: 1300-1400 OT Individual Time Calculation (min): 60 min    Patient has met 9 of 12 long term goals due to improved activity tolerance, improved balance, improved attention and improved awareness.  Patient to discharge at [facility] Assist level.  Patient's care partner is independent to provide the necessary physical and cognitive assistance at discharge.    Reasons goals not met: Lower body dressing, lower body bathing and hygiene after toileting require min A due to limited reach/balance and ongoing endurance deficits.  Recommendation:  Patient will benefit from ongoing skilled OT services in home health setting to continue to advance functional skills in the area of BADL and Reduce care partner burden.  Equipment: No equipment provided  Reasons for discharge: treatment goals met  Patient/family agrees with progress made and goals achieved: Yes   OT treatment session:  Patient seated in w/c after lunch and agreeable to take a shower and complete discharge assessment.  Patient ambulated into bathroom with rollator CS and occ cues for safety.  ADL status as documented below.  Functional transfers t/o session with rollator require CS/CG to/from bed, w/c, arm chair, shower bench.  Seated on arm chair in bathroom after shower for dressing - patient notes that he is feeling fatigued, had an episode of vomiting moderate amount - nursing aware, patient stated that he felt okay after vomiting episode.  He remained sitting upright in w/c completing light activity for remainder of session.  Returned to bed at close of session for a rest break, bed alarm set and nursing present.    OT Discharge Precautions/Restrictions  Precautions Precautions: Fall Restrictions Weight Bearing Restrictions: No General   Vital Signs Therapy  Vitals Temp: (!) 97.4 F (36.3 C) Temp Source: Oral Pulse Rate: 75 Resp: 15 BP: (!) 99/56 Patient Position (if appropriate): Lying Oxygen Therapy SpO2: 90 % O2 Device: Room Air Pain Pain Assessment Pain Scale: 0-10 Pain Score: 0-No pain Faces Pain Scale: Hurts little more Pain Type: Acute pain Pain Location: Back Pain Orientation: Lower;Right Pain Descriptors / Indicators: Aching Pain Onset: Gradual Pain Intervention(s): Rest Multiple Pain Sites: No ADL ADL Eating: Set up Where Assessed-Eating: Wheelchair Grooming: Setup Where Assessed-Grooming: Wheelchair Upper Body Bathing: Supervision/safety Where Assessed-Upper Body Bathing: Shower Lower Body Bathing: Minimal assistance Where Assessed-Lower Body Bathing: Shower Upper Body Dressing: Supervision/safety Where Assessed-Upper Body Dressing: Chair Lower Body Dressing: Moderate assistance Where Assessed-Lower Body Dressing: Chair Toileting: Minimal assistance Where Assessed-Toileting: Glass blower/designer: Close supervision Toilet Transfer Method: Counselling psychologist: Child psychotherapist: Facilities manager: Curator Method: Heritage manager: Grab bars, Gaffer Baseline Vision/History: No visual deficits Patient Visual Report: No change from baseline Vision Assessment?: No apparent visual deficits Perception  Perception: Within Functional Limits Praxis Praxis: Intact Cognition Overall Cognitive Status: History of cognitive impairments - at baseline Arousal/Alertness: Awake/alert Orientation Level: Oriented to time;Oriented to situation;Oriented to person;Disoriented to place Attention: Selective Focused Attention: Appears intact Sustained Attention: Appears intact Selective Attention: Appears intact Selective Attention Impairment: Verbal basic;Functional basic Memory: Impaired Memory  Impairment: Decreased short term memory;Decreased recall of new information Decreased Short Term Memory: Verbal complex;Functional complex Awareness: Impaired Awareness Impairment: Anticipatory impairment Problem Solving: Impaired Problem Solving Impairment: Functional complex Executive Function: Decision Making;Self Correcting Reasoning: Impaired Reasoning Impairment: Functional basic Sequencing: Appears intact Organizing: Impaired  Organizing Impairment: Functional basic Self Monitoring: Impaired Self Monitoring Impairment: Functional basic Self Correcting: Impaired Self Correcting Impairment: Functional basic Safety/Judgment: Impaired [employer] Scales of Cognitive Functioning: Purposeful/appropriate Sensation Sensation Light Touch: Appears Intact Hot/Cold: Appears Intact Proprioception: Appears Intact Coordination Gross Motor Movements are Fluid and Coordinated: No Fine Motor Movements are Fluid and Coordinated: Not tested Coordination and Movement Description: able to manage wash cloth/soap and shower head, able to open containers for self care/grooming tasks Motor  Motor Motor - Skilled Clinical Observations: fatigue limits function Motor - Discharge Observations: generalized weakness Mobility  Bed Mobility Bed Mobility: Rolling Right;Rolling Left;Sitting - Scoot to Edge of Bed;Right Sidelying to Sit Rolling Right: Supervision/verbal cueing Rolling Left: Supervision/Verbal cueing Right Sidelying to Sit: Supervision/Verbal cueing Supine to Sit: Supervision/Verbal cueing Sitting - Scoot to Edge of Bed: Supervision/Verbal cueing Sit to Supine: Supervision/Verbal cueing Transfers Sit to Stand: Supervision/Verbal cueing Stand to Sit: Supervision/Verbal cueing  Trunk/Postural Assessment  Cervical Assessment Cervical Assessment: Within Functional Limits Thoracic Assessment Thoracic Assessment: Exceptions to WFL(rounded shoulders) Lumbar Assessment Lumbar  Assessment: Exceptions to WFL(posterior pelvic tilt) Postural Control Postural Control: Deficits on evaluation(delayed stepping reactions)  Balance Balance Balance Assessed: Yes Dynamic Sitting Balance Dynamic Sitting - Balance Support: No upper extremity supported;During functional activity Dynamic Sitting - Level of Assistance: 7: Independent Dynamic Sitting - Balance Activities: Lateral lean/weight shifting;Forward lean/weight shifting;Reaching across midline Static Standing Balance Static Standing - Balance Support: During functional activity;Bilateral upper extremity supported Static Standing - Level of Assistance: 6: Modified independent (Device/Increase time) Dynamic Standing Balance Dynamic Standing - Balance Support: During functional activity;Bilateral upper extremity supported Dynamic Standing - Level of Assistance: 5: Stand by assistance Dynamic Standing - Balance Activities: Lateral lean/weight shifting;Forward lean/weight shifting;Reaching across midline Extremity/Trunk Assessment RUE Assessment RUE Assessment: Within Functional Limits LUE Assessment LUE Assessment: Within Functional Limits   Carlos Levering 08/12/2018, 4:04 PM

## 2018-08-12 NOTE — Progress Notes (Addendum)
Physical Therapy Discharge Summary  Patient Details  Name: [name] MRN: [id number] Date of Birth: 1941/03/31  Today's Date: 08/12/2018 PT Individual Time: 1100-1200 PT Individual Time Calculation (min): 60 min    Patient has met 3 of 10 long term goals due to improved balance, improved postural control, decreased pain and improved coordination.  Patient to discharge at ambulatory/wheelchair level Supervision.   Patient's care partner is independent to provide the necessary physical assistance at discharge.  Reasons goals not met: Decreased activity tolerance/endurance, decreased strength, and cognitive deficits (lack of self-monitoring, anticipatory awareness, and decreased short term memory prevent carryover of tasks to daily life) limit the pt from performing functional transfers, ambulation, and wheelchair propulsion consistently with supervision.  Recommendation:  Patient will benefit from ongoing skilled PT services in home health setting to continue to advance safe functional mobility, address ongoing impairments in activity tolerance, strength, dynamic balance, and minimize fall risk.  Equipment: Hospital bed. Pt owns rollator.  Reasons for discharge: lack of progress toward goals and discharge from hospital  Patient/family agrees with progress made and goals achieved: Yes   Therapeutic Physical Therapy Interventions: Pt received laying in bed. Pt a bit agitated/reluctant to participate in PT session, but encouraged to participate to prepare for discharge home tomorrow. Motor, mobility, locomotion, postural control, balance, and strength assessed as described below. Pt inconsistently requires S/CGA/MinA with therapeutic activities dependent on fatigue level/activity tolerance. All questions regarding discharge home were answered, without any remaining questions at the end of the session. Pt remained seated in w/c with nurse tech present at the end of the session.   PT  Discharge Precautions/Restrictions Precautions Precautions: Fall Restrictions Weight Bearing Restrictions: No Pain Pain Assessment Faces Pain Scale: Hurts little more Pain Type: Acute pain Pain Location: Back Pain Orientation: Lower;Right Pain Descriptors / Indicators: Aching Pain Onset: Gradual Pain Intervention(s): Rest Multiple Pain Sites: No Vision/Perception  Perception Perception: Within Functional Limits Praxis Praxis: Intact  Cognition Overall Cognitive Status: Impaired/Different from baseline Arousal/Alertness: Awake/alert Orientation Level: Oriented X4 Attention: Selective Selective Attention: Impaired Selective Attention Impairment: Verbal complex;Functional complex Memory: Impaired Memory Impairment: Decreased recall of new information Awareness: Impaired Awareness Impairment: Anticipatory impairment Problem Solving: Impaired Problem Solving Impairment: Functional basic Executive Function: Organizing;Self Monitoring;Self Correcting Organizing: Impaired Organizing Impairment: Functional basic Self Monitoring: Impaired Self Monitoring Impairment: Functional basic Self Correcting: Impaired Self Correcting Impairment: Functional basic Safety/Judgment: Impaired [employer] Scales of Cognitive Functioning: Purposeful/appropriate Sensation Sensation Light Touch: Appears Intact Proprioception: Appears Intact Coordination Gross Motor Movements are Fluid and Coordinated: No Fine Motor Movements are Fluid and Coordinated: Not tested Coordination and Movement Description: slow movements Motor  Motor Motor - Discharge Observations: generalized weakness  Mobility Bed Mobility Bed Mobility: Rolling Right;Rolling Left;Sitting - Scoot to Edge of Bed;Right Sidelying to Sit Rolling Right: Supervision/verbal cueing Rolling Left: Supervision/Verbal cueing Right Sidelying to Sit: Supervision/Verbal cueing Supine to Sit: Supervision/Verbal cueing Sitting -  Scoot to Edge of Bed: Supervision/Verbal cueing Sit to Supine: Supervision/Verbal cueing Transfers Transfers: Sit to Stand;Stand Pivot Transfers Sit to Stand: Minimal Assistance - Patient > 75%;Contact Guard/Touching assist Stand to Sit: Supervision/Verbal cueing;Contact Guard/Touching assist Stand Pivot Transfers: Contact Guard/Touching assist;Supervision/Verbal cueing Stand Pivot Transfer Details: Verbal cues for gait pattern;Verbal cues for safe use of DME/AE;Verbal cues for precautions/safety;Verbal cues for technique;Verbal cues for sequencing Transfer (Assistive device): Rollator Locomotion  Gait Ambulation: Yes Gait Assistance: Supervision/Verbal cueing Gait Distance (Feet): 100 Feet Assistive device: Rollator Gait Assistance Details: Verbal cues for precautions/safety;Verbal cues for technique;Verbal cues  for safe use of DME/AE;Verbal cues for sequencing Gait Gait: Yes Gait Pattern: Impaired Gait Pattern: Festinating;Narrow base of support;Poor foot clearance - left;Poor foot clearance - right;Shuffle;Decreased stride length;Step-through pattern Gait velocity: decreased  Stairs / Additional Locomotion Stairs: Yes Stairs Assistance: Supervision/Verbal cueing Stair Management Technique: Two rails;Forwards;Step to pattern Number of Stairs: 8 Height of Stairs: 6 Ramp: Supervision/Verbal cueing Curb: Supervision/Verbal Location manager Mobility: Yes Wheelchair Assistance: Chartered loss adjuster: Both upper extremities Wheelchair Parts Management: Needs assistance Distance: 56'  Trunk/Postural Assessment  Cervical Assessment Cervical Assessment: Within Functional Limits Thoracic Assessment Thoracic Assessment: Exceptions to WFL(rounded shoulders) Lumbar Assessment Lumbar Assessment: Exceptions to WFL(posterior pelvic tilt) Postural Control Postural Control: Deficits on evaluation(delayed stepping reactions)   Balance Balance Balance Assessed: Yes Dynamic Sitting Balance Dynamic Sitting - Balance Support: No upper extremity supported;During functional activity Dynamic Sitting - Level of Assistance: 7: Independent Dynamic Sitting - Balance Activities: Lateral lean/weight shifting;Forward lean/weight shifting;Reaching across midline Static Standing Balance Static Standing - Balance Support: During functional activity;Bilateral upper extremity supported Static Standing - Level of Assistance: 6: Modified independent (Device/Increase time) Dynamic Standing Balance Dynamic Standing - Balance Support: During functional activity;Bilateral upper extremity supported Dynamic Standing - Level of Assistance: 5: Stand by assistance Dynamic Standing - Balance Activities: Lateral lean/weight shifting;Forward lean/weight shifting;Reaching across midline Extremity Assessment  RUE Assessment RUE Assessment: Within Functional Limits LUE Assessment LUE Assessment: Within Functional Limits RLE Assessment RLE Assessment: Exceptions to [facility] General Strength Comments: grossly 4/5  LLE Assessment LLE Assessment: Exceptions to [facility] General Strength Comments: grossly 4/5     Martinique Makaley Storts 08/12/2018, 12:43 PM

## 2018-08-12 NOTE — Progress Notes (Signed)
Social Work  Discharge Note  The overall goal for the admission was met for:   Discharge location: Yes-HOME WITH WIFE WHO CAN PROVIDE 24 HR SUPERVISION  Length of Stay: Yes-15 DAYS  Discharge activity level: Yes-SUPERVISION LEVEL  Home/community participation: Yes  Services provided included: MD, RD, PT, OT, SLP, RN, CM, TR, Pharmacy and SW  Financial Services: Medicare and Private Insurance: TRICARE  Follow-up services arranged: Home Health: [facility]-PT,OT,SP,RN, DME: [facility] and Patient/Family request agency [facility]: [facility], DME: NO PREF  Comments (or additional information):WIFE AWARE OF THE AMOUNT OF CARE PT REQUIRES, SHE WANTS TO [place]. [facility] 3-4 HOURS PER DAY AND BEING ARRANGED. PT TAKES HD VAN TO HD-3X WEEK. HAS OTHER NEEDED EQUIPMENT FROM THE [place]. WIFE TOLD HE WILL NEED CONSTANT CUEING AND GUIDANCE.  Patient/Family verbalized understanding of follow-up arrangements: Yes  Individual responsible for coordination of the follow-up plan: SHIRLEY-WIFE  Confirmed correct DME delivered: Elease Hashimoto 08/12/2018    Elease Hashimoto

## 2018-08-12 NOTE — Progress Notes (Signed)
Stool collected and sent to lab for Occult blood

## 2018-08-12 NOTE — Progress Notes (Signed)
On called notified for patient b/p reading of 75/54 ( Dinamap) and 72/46) manually,patient is asymptomatic and denies discomfort .Informed to monitor and assess frequently throughout shift.Patient informed

## 2018-08-12 NOTE — Progress Notes (Signed)
[facility] PHYSICAL MEDICINE & REHABILITATION PROGRESS NOTE  Subjective/Complaints: Patient seen laying in bed this morning.  He states he slept well overnight.  He needs repeated reassurance regarding progress.  ROS: Denies CP, shortness of breath, nausea, vomiting, diarrhea.  Objective: Vital Signs: Blood pressure (!) 62/31, pulse 76, temperature 98.1 F (36.7 C), temperature source Oral, resp. rate 19, weight 64.3 kg, SpO2 100 %. No results found. Recent Labs    08/11/18 1406  WBC 9.4  HGB 8.2*  HCT 25.3*  PLT 206   Recent Labs    08/09/18 1647 08/11/18 1406  NA 135 134*  K 3.8 3.6  CL 97* 97*  CO2 25 24  GLUCOSE 99 186*  BUN 98* 80*  CREATININE 8.52* 7.94*  CALCIUM 10.5* 10.2    Physical Exam: BP (!) 62/31   Pulse 76   Temp 98.1 F (36.7 C) (Oral)   Resp 19   Wt 64.3 kg   SpO2 100%   BMI 19.77 kg/m  Constitutional: Well-developed. NAD HENT:Bilateral crani incisions healing Eyes:EOMI. No discharge.   Cardiovascular: RRR.  No JVD. Respiratory:Effort normal.  Clear. GI: He exhibitsno distension. Bowel sounds normal.  Musculoskeletal: He exhibits noedema and tenderness. Neurological: Alert and Oriented x3, except for date of month.  Able to follow simple one step motor commands.  Motor: B/l UE 4+-5/5 proximal to distal, stable B/l LE: HF 4+/5, KE 4+-5/5, ADF 5/5, stable HOH Skin: Warm and dry. Intact.Incision site C/D/I  Assessment/Plan: 1. 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  Physiatrist is providing close team supervision and 24 hour management of active medical problems listed below.  Physiatrist and rehab team continue to assess barriers to discharge/monitor patient progress toward functional and medical goals  Care Tool:  Bathing    Body parts bathed by patient: Buttocks   Body parts bathed by helper: Buttocks, Right  lower leg, Left lower leg     Bathing assist Assist Level: Total Assistance - Patient < 25%     Upper Body Dressing/Undressing Upper body dressing   What is the patient wearing?: Pull over shirt    Upper body assist Assist Level: Minimal Assistance - Patient > 75%    Lower Body Dressing/Undressing Lower body dressing      What is the patient wearing?: Underwear/pull up, Pants     Lower body assist Assist for lower body dressing: Moderate Assistance - Patient 50 - 74%     Toileting Toileting Toileting Activity did not occur (Clothing management and hygiene only): N/A (no void or bm)  Toileting assist Assist for toileting: Moderate Assistance - Patient 50 - 74%     Transfers Chair/bed transfer  Transfers assist     Chair/bed transfer assist level: Contact Guard/Touching assist     Locomotion Ambulation   Ambulation assist      Assist level: Contact Guard/Touching assist Assistive device: Walker-rolling Max distance: 50   Walk 10 feet activity   Assist     Assist level: Contact Guard/Touching assist Assistive device: Walker-rolling   Walk 50 feet activity   Assist    Assist level: Contact Guard/Touching assist Assistive device: Walker-rolling    Walk 150 feet activity   Assist Walk 150 feet activity did not occur: Safety/medical concerns(fatigue)  Assist level: Minimal Assistance - Patient > 75% Assistive device: Walker-rolling    Walk 10 feet on uneven surface  activity   Assist Walk 10 feet on  uneven surfaces activity did not occur: Safety/medical concerns         Wheelchair     Assist Will patient use wheelchair at discharge?: No      Wheelchair assist level: Minimal Assistance - Patient > 75% Max wheelchair distance: 50    Wheelchair 50 feet with 2 turns activity    Assist        Assist Level: Minimal Assistance - Patient > 75%   Wheelchair 150 feet activity     Assist            Medical  Problem List and Plan: 1.Functional and cognitive deficitssecondary to bi-frontal-temporal SDH's status post bur holes on 10/29.   Continue CIR  CT reviewed, stable on 11/7  Plan for d/c tomorrow  Will see patient for transitional care management in 1-2 weeks post-discharge 2. DVT Prophylaxis/Anticoagulation: Mechanical:Sequential compression devices, below kneeBilateral lower extremities 3. Pain Management:tylenol pron 4. Mood:LCSW to follow for evaluation and support. 5. Neuropsych: This patientis notcapable of making decisions on hisown behalf. 6. Skin/Wound Care:routine pressure relief measures. 7. Fluids/Electrolytes/Nutrition:Monitor I/Os. Labs with HD 8. ESRD: HD MWF at end of the day to help with tolerance of therapy.  Appreciate Nephro recs, notes reviewed.  9. CAD: Stable  10 T2DM: Monitor BS ac/hs and use SSI for elevated BS.Was on Victoza at home due to multiple hypoglycemic episodes with long acting insulin.  Labile on 11/19  Cont SSI CBG (last 3)  Recent Labs    08/11/18 1814 08/11/18 2135 08/12/18 0702  GLUCAP 194* 111* 156*   11. Seizure prophylaxis:off  Keppra  12. H/O IBS: Discontinued laxatives due to reports of incontinence due to diarrhea:  13. Dementia: On Nameda and Aricept. 14. Loose stools. Wife reports history of cycling loose and then constipated stools PTA.   Senna started on 11/7 Added probiotic   Improving overall 15.  Labile blood pressure- proamatine ordered for HD days   Vitals:   08/12/18 0629 08/12/18 0633  BP: (!) 66/28 (!) 62/31  Pulse: 83 76  Resp: 19   Temp: 98.1 F (36.7 C)   SpO2: 100%    Labile on 11/19 with HD 16.  Anemia of chronic disease  Hemoglobin 8.2 on 11/18  Recs per Nephro  Hemoccult positive  Continue to monitor 17.  Leukocytosis: Resolved  WBCs 9.4 on 11/18  Labs with HD  Afebrile  Continue to monitor  LOS: 14 days A FACE TO FACE EVALUATION WAS PERFORMED  Ankit [name] 08/12/2018, 7:55 AM

## 2018-08-12 NOTE — Progress Notes (Signed)
Aranesp dose was not charted yesterday with HD. D/w Dr. Posey Pronto of [place]. We will give the missing dose today.   Aranesp 95mcg SQ x1   Onnie Boer, PharmD, [place], AAHIVP, CPP Infectious Disease Pharmacist 08/12/2018 8:53 AM

## 2018-08-12 NOTE — Progress Notes (Addendum)
Social Work Patient ID: [name], male   DOB: 1940-10-30, 77 y.o.   MRN: [id number] Pt placed on first run for HD tomorrow am due to discharge after. Have contacted [facility] to resume follow up for PT,OT,SP and RN approved through the [place]. Hospital bed to be delivered to home today. Ready for discharge tomorrow after HD. Wife aware will need 24 hr close supervision.

## 2018-08-12 NOTE — Progress Notes (Signed)
  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 Bilat SDH sp hematoma evacuation - on CIR now (NO HEP HD) 2. ESRD- MWF. No further cannulation issues since 11/15.  HD tomorrow 1st shift in preparation for d/c. / no fleet enema with esrd use mira lax and colace , dulcolax  3. Anemia- hgb 10.0stable.  Maintenance aranesp 60q mon 4. Secondary hyperparathyroidism- Corr ca 11.8 ,has been elevated even prior to hospitalization./  Phos 5.7 Changed to 2 Ca dialysate and started sensipar 30 daily; calcitriol d/c'd.PTH was 217 10/23  5. HTN/volume-on midodrine pre HD. Sig wt loss, lower edw at dc 6. Nutrition- albumin noted 2.6. Cont supplements and multivit 7. Dementia- baseline but functional prior to acute event. 8. Multiple rib fx - stable 9. DM/hypothyroidism - meds per primary 10. Dispo: d/c tomorrow after HD  Subjective:    Seen in room.  Had enema last night- good effects.  D/c for tomorrow planned   Objective:   BP (!) 62/31   Pulse 76   Temp 98.1 F (36.7 C) (Oral)   Resp 19   Wt 64.3 kg   SpO2 100%   BMI 19.77 kg/m   Physical Exam: General:alert, sitting in chair, NAD, eating lunch Heart:RRR, no m/r/g Lungs:CTA Abdomen:soft, nondistended, nontender Extremities:no pedal edema Dialysis Access:RUA AVF +bruit   Labs: BMET Recent Labs  Lab 08/06/18 1326 08/08/18 1557 08/09/18 1647 08/11/18 1406  NA 132* 137 135 134*  K 3.5 4.0 3.8 3.6  CL 95* 96* 97* 97*  CO2 23 28 25 24   GLUCOSE 155* 167* 99 186*  BUN 53* 68* 98* 80*  CREATININE 5.42* 6.88* 8.52* 7.94*  CALCIUM 9.1 10.4* 10.5* 10.2  PHOS 4.0 4.7* 5.7*  --    CBC Recent Labs  Lab 08/06/18 1326 08/08/18 1557 08/11/18 1406  WBC 8.8 10.6* 9.4  HGB 10.1* 10.0* 8.2*  HCT 30.5* 32.0*  25.3*  MCV 96.2 98.5 97.3  PLT 218 199 206    @IMGRELPRIORS @ Medications:    . Chlorhexidine Gluconate Cloth  6 each Topical Q0600  . cinacalcet  30 mg Oral Q supper  . darbepoetin (ARANESP) injection - DIALYSIS  60 mcg Intravenous Q Mon-HD  . darbepoetin (ARANESP) injection - NON-DIALYSIS  60 mcg Subcutaneous Once  . docusate sodium  100 mg Oral Daily  . donepezil  10 mg Oral QHS  . feeding supplement (NEPRO CARB STEADY)  237 mL Oral BID BM  . feeding supplement (PRO-STAT SUGAR FREE 64)  30 mL Oral BID  . insulin aspart  0-9 Units Subcutaneous TID WC  . levothyroxine  50 mcg Oral QAC breakfast  . liraglutide  0.6 mg Subcutaneous Q0600  . memantine  10 mg Oral BID  . midodrine  10 mg Oral Q M,W,F-HD  . multivitamin  1 tablet Oral QHS  . pantoprazole  40 mg Oral Daily  . polyethylene glycol  17 g Oral Daily  . saccharomyces boulardii  250 mg Oral BID  . senna  1 tablet Oral QHS  . sevelamer carbonate  800 mg Oral Q supper  . simvastatin  20 mg Oral QHS  . vitamin B-12  1,000 mcg Oral Daily     Madelon Lips, MD [place] pgr [phone] 08/12/2018, 1:19 PM

## 2018-08-12 NOTE — Progress Notes (Signed)
Speech Language Pathology Discharge Summary  Patient Details  Name: [name] MRN: [id number] Date of Birth: 1941/05/14  Today's Date: 08/12/2018 SLP Individual Time: 0806-0901 SLP Individual Time Calculation (min): 55 min   Skilled Therapeutic Interventions:  Pt was seen for skilled ST targeting cognitive goals.  Pt was in bed upon arrival but was agreeable to getting up for therapy despite complaints of being tired following dialysis and enema administration yesterday evening.  SLP facilitated the session with skilled education regarding memory compensatory strategies, emphasizing writing information down, keeping the environment organized to maximize recall of daily information, maintaining a consistent routine, and being mindful of distractions when talking with people or when completing tasks.  Pt was provided with a handout of strategies to maximize carryover in the home environment given that his wife has not been present for any training.  Per pt's report, pt's wife will resume helping him with his medications.  Pt should have 24/7 supervision between family, friends, and paid caregivers.  Education is complete for this venue of care.  Pt was returned to room and left in bed with bed alarm set and all needs within reach.       Patient has met 3 of 3 long term goals.  Patient to discharge at [facility] level.  Reasons goals not met:     Clinical Impression/Discharge Summary:   Pt has made functional gains while inpatient and is discharging having met 3 out of 3 long term goals.  Pt is currently min assist for tasks due to mild-moderate cognitive deficits characterized by decreased emergent awareness of deficits, decreased recall of new information, decreased functional problem solving, and decreased selective attention to tasks.    Pt education is complete at this time.  Family has not been present for training with SLP; however, pt reports he will have recommended level of assistance  at home.  Pt is discharging home with recommendations for ST follow up at next level of care in addition to 24/7 supervision.      Care Partner:  Caregiver Able to Provide Assistance: Yes  Type of Caregiver Assistance: Physical;Cognitive  Recommendation:  Home Health SLP;24 hour supervision/assistance  Rationale for SLP Follow Up: Maximize cognitive function and independence   Equipment: none recommended by SLP   Reasons for discharge: Discharged from hospital   Patient/Family Agrees with Progress Made and Goals Achieved: Yes    Lynetta Tomczak, Selinda Orion 08/12/2018, 1:09 PM

## 2018-08-13 ENCOUNTER — Telehealth: Payer: Self-pay | Admitting: Internal Medicine

## 2018-08-13 ENCOUNTER — Inpatient Hospital Stay (HOSPITAL_COMMUNITY): Payer: Medicare Other | Admitting: Physical Therapy

## 2018-08-13 LAB — CBC
HCT: 23.7 % — ABNORMAL LOW (ref 39.0–52.0)
Hemoglobin: 7.6 g/dL — ABNORMAL LOW (ref 13.0–17.0)
MCH: 31.5 pg (ref 26.0–34.0)
MCHC: 32.1 g/dL (ref 30.0–36.0)
MCV: 98.3 fL (ref 80.0–100.0)
Platelets: 216 10*3/uL (ref 150–400)
RBC: 2.41 MIL/uL — ABNORMAL LOW (ref 4.22–5.81)
RDW: 15.5 % (ref 11.5–15.5)
WBC: 9.2 10*3/uL (ref 4.0–10.5)
nRBC: 0 % (ref 0.0–0.2)

## 2018-08-13 LAB — RENAL FUNCTION PANEL
Albumin: 2.6 g/dL — ABNORMAL LOW (ref 3.5–5.0)
Anion gap: 12 (ref 5–15)
BUN: 56 mg/dL — ABNORMAL HIGH (ref 8–23)
CO2: 26 mmol/L (ref 22–32)
Calcium: 10.3 mg/dL (ref 8.9–10.3)
Chloride: 98 mmol/L (ref 98–111)
Creatinine, Ser: 6.95 mg/dL — ABNORMAL HIGH (ref 0.61–1.24)
GFR calc Af Amer: 8 mL/min — ABNORMAL LOW (ref >60)
GFR calc non Af Amer: 7 mL/min — ABNORMAL LOW (ref >60)
Glucose, Bld: 113 mg/dL — ABNORMAL HIGH (ref 70–99)
Phosphorus: 5.6 mg/dL — ABNORMAL HIGH (ref 2.5–4.6)
Potassium: 3.7 mmol/L (ref 3.5–5.1)
Sodium: 136 mmol/L (ref 135–145)

## 2018-08-13 LAB — GLUCOSE, CAPILLARY
Glucose-Capillary: 103 mg/dL — ABNORMAL HIGH (ref 70–99)
Glucose-Capillary: 256 mg/dL — ABNORMAL HIGH (ref 70–99)

## 2018-08-13 MED ORDER — DOCUSATE SODIUM 100 MG PO CAPS: 100.0000 mg | ORAL_CAPSULE | Freq: Every day | ORAL | 0 refills | Status: DC

## 2018-08-13 MED ORDER — HYDROXYZINE HCL 25 MG PO TABS
ORAL_TABLET | ORAL | Status: AC
  Filled 2018-08-13: qty 1

## 2018-08-13 MED ORDER — SACCHAROMYCES BOULARDII 250 MG PO CAPS: 250.0000 mg | ORAL_CAPSULE | Freq: Two times a day (BID) | ORAL | Status: DC

## 2018-08-13 MED ORDER — HYDROXYZINE HCL 25 MG PO TABS
25.0000 mg | ORAL_TABLET | Freq: Once | ORAL | Status: AC
  Administered 2018-08-13: 25 mg via ORAL

## 2018-08-13 MED ORDER — MIDODRINE HCL 5 MG PO TABS
ORAL_TABLET | ORAL | Status: AC
  Filled 2018-08-13: qty 2

## 2018-08-13 MED ORDER — CINACALCET HCL 30 MG PO TABS: 30.0000 mg | ORAL_TABLET | Freq: Every day | ORAL | 0 refills | Status: DC

## 2018-08-13 MED ORDER — DIPHENHYDRAMINE HCL 50 MG/ML IJ SOLN
INTRAMUSCULAR | Status: AC
  Filled 2018-08-13: qty 1

## 2018-08-13 MED ORDER — SENNA 8.6 MG PO TABS: 1.0000 | ORAL_TABLET | Freq: Every day | ORAL | 0 refills | Status: DC

## 2018-08-13 MED ORDER — POLYETHYLENE GLYCOL 3350 17 G PO PACK: 17.0000 g | PACK | Freq: Every day | ORAL | 0 refills | Status: DC

## 2018-08-13 MED ORDER — DIPHENHYDRAMINE HCL 25 MG PO CAPS
ORAL_CAPSULE | ORAL | Status: AC
  Filled 2018-08-13: qty 1

## 2018-08-13 MED ORDER — SEVELAMER CARBONATE 800 MG PO TABS: 800.0000 mg | ORAL_TABLET | Freq: Three times a day (TID) | ORAL | 0 refills | Status: DC

## 2018-08-13 MED ORDER — ACETAMINOPHEN 325 MG PO TABS
ORAL_TABLET | ORAL | Status: AC
  Filled 2018-08-13: qty 2

## 2018-08-13 MED ORDER — MIDODRINE HCL 10 MG PO TABS: ORAL_TABLET | ORAL | Status: AC

## 2018-08-13 NOTE — Discharge Instructions (Signed)
Inpatient Rehab Discharge Instructions  [name] Discharge date and time:  08/13/18  Activities/Precautions/ Functional Status: Activity: no lifting, driving, or strenuous exercise till cleared by MD.  Diet: diabetic diet and renal diet--1200 cc fluid/day Wound Care: keep wound clean and dry    Functional status:  ___ No restrictions     ___ Walk up steps independently _X__ 24/7 supervision/assistance   ___ Walk up steps with assistance ___ Intermittent supervision/assistance  ___ Bathe/dress independently ___ Walk with walker     _X__ Bathe/dress with assistance ___ Walk Independently    ___ Shower independently ___ Walk with assistance    ___ Shower with assistance _X__ No alcohol     ___ Return to work/school ________   Special Instructions: 1. He is on multiple bowel medications. Increase or decrease these as needed to ensure that he has a BM at least once every other day.  2. Can use Zyrtec for itching as needed. Can also use Sarna lotion couple of times a day 3. Needs stand by assistance with all activity/walking.    COMMUNITY REFERRALS UPON DISCHARGE:    Home Health:   PT, OT, SP, RN   [place]   Date of last service:08/13/2018  Medical Equipment/Items Ordered:HOSPITAL BED  Agency/Supplier:[facility]   [place] 2-3 HOURS PER DAY  GENERAL COMMUNITY RESOURCES FOR PATIENT/FAMILY: Support Groups:[employer] SUPPORT GROUP THE SECOND Tuesday @ 7:00-8:30 PM ON THE REHAB UNIT QUESTIONS [place] Handler [phone]   My questions have been answered and I understand these instructions. I will adhere to these goals and the provided educational materials after my discharge from the hospital.  Patient/Caregiver Signature _______________________________ Date __________  Clinician Signature _______________________________________ Date __________  Please bring this form and your  medication list with you to all your follow-up doctor's appointments.

## 2018-08-13 NOTE — Procedures (Signed)
Patient seen and examined on Hemodialysis. QB 400 mL/ min via AVF, UF goal 2.5L.  Received midodrine prior to rx.  Chronic hypotension   Treatment adjusted as needed.  Madelon Lips MD [facility] pgr [phone] 9:01 AM

## 2018-08-13 NOTE — Plan of Care (Signed)
  Problem: RH BOWEL ELIMINATION Goal: RH STG MANAGE BOWEL WITH ASSISTANCE Description STG Manage Bowel with min Assistance.  Outcome: Adequate for Discharge   Problem: RH SKIN INTEGRITY Goal: RH STG SKIN FREE OF INFECTION/BREAKDOWN Description Skin free of breakdown and infection with min assist  Outcome: Adequate for Discharge Goal: RH STG MAINTAIN SKIN INTEGRITY WITH ASSISTANCE Description STG Maintain Skin Integrity With min Assistance.  Outcome: Adequate for Discharge   Problem: RH SAFETY Goal: RH STG ADHERE TO SAFETY PRECAUTIONS W/ASSISTANCE/DEVICE Description STG Adhere to Safety Precautions With min Assistance/Device.  Outcome: Adequate for Discharge   Problem: RH COGNITION-NURSING Goal: RH STG USES MEMORY AIDS/STRATEGIES W/ASSIST TO PROBLEM SOLVE Description STG Uses Memory Aids/Strategies With min Assistance to Problem Solve.  Outcome: Adequate for Discharge Goal: RH STG ANTICIPATES NEEDS/CALLS FOR ASSIST W/ASSIST/CUES Description STG Anticipates Needs/Calls for Assist With min Assistance/Cues.  Outcome: Adequate for Discharge   Problem: RH PAIN MANAGEMENT Goal: RH STG PAIN MANAGED AT OR BELOW PT'S PAIN GOAL Description Less than 3 out of 10  Outcome: Adequate for Discharge   Problem: RH KNOWLEDGE DEFICIT Goal: RH STG INCREASE KNOWLEDGE OF DIABETES Description Pt and wife will be able to demo understanding of management of DM, CBG checks and insulin administration with cues/reminders   Outcome: Adequate for Discharge   Problem: Consults Goal: [facility] GENERAL PATIENT EDUCATION Description See Patient Education module for education specifics. Outcome: Adequate for Discharge Goal: Skin Care Protocol Initiated - if Braden Score 18 or less Description If consults are not indicated, leave blank or document N/A Outcome: Adequate for Discharge Goal: Nutrition Consult-if indicated Outcome: Adequate for Discharge Goal: Diabetes Guidelines if Diabetic/Glucose >  140 Description If diabetic or lab glucose is > 140 mg/dl - Initiate Diabetes/Hyperglycemia Guidelines & Document Interventions  Outcome: Adequate for Discharge

## 2018-08-13 NOTE — Telephone Encounter (Signed)
Received medical records request from [place] requesting records from [place].  Sent request over to Sherlon Handing, HIM in the [place].  She sent it back to me on 11/6.  I have been out of the office and back today and copied patients paper chart from 2004 (when we first started seeing patient) to 09/24/2010.  Sending back to Sherlon Handing now to complete the electronic portion of the medical records through 2019 and she will send the paper charts as well.    Paper records will go out to [facility] on Thursday, 08/14/18 via courier.

## 2018-08-13 NOTE — Progress Notes (Signed)
Report called to Hemodialysis staff  Zandra Abts), ,informed of current and prior b/p and pulse reading. Patient remains asymptomatic and w/o complaints. /  [id number] Transporter to floor and transported patient to Hemo via bed, Dan, [place] informed of status , no new orders

## 2018-08-13 NOTE — Progress Notes (Signed)
Patient is scheduled for Discharge today. Discharge orders have been placed. Patient is alert and oriented x3  to person, place and situation. Disoriented to time. Spouse at bedside. Discharge Instructions provided by P. Love, PA. Patient Discharged to home with personal belongings. Patient transported via wheelchair to private vehicle.

## 2018-08-13 NOTE — Progress Notes (Signed)
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 Bilat SDH sp hematoma evacuation - on CIR now (NO HEP HD) 2. ESRD- MWF. No further cannulation issues since 11/15.  HD tomorrow 1st shift in preparation for d/c. / no fleet enema with esrd use mira lax and colace , dulcolax  3. Anemia- hgb drifting down a bit  Got Aranesp 60q mon, will need increased mircera dose at OP center. 4. Secondary hyperparathyroidism- Corr ca 11.8 ,has been elevated even prior to hospitalization. Phos 5.7 Changed to 2 Ca dialysate and started sensipar 30 daily; calcitriol d/c'd.PTH was 217 10/23  5. HTN/volume-on midodrine pre HD. Sig wt loss, lower edw at dc.  Intermittent hypotension 6. Nutrition- albumin noted 2.6. Cont supplements and multivit 7. Dementia- baseline but functional prior to acute event. 8. Multiple rib fx - stable 9. DM/hypothyroidism - meds per primary 10. Dispo: d/c today after HD  Subjective:    Seen on dialysis.  Complaining that he didn't get Tylenol.     Objective:   BP (!) 88/58   Pulse (!) 56   Temp 98.5 F (36.9 C) (Oral)   Resp 16   Wt 63.6 kg   SpO2 98%   BMI 19.56 kg/m   Physical Exam: General:alert, lying in bed, NAD Heart:RRR, no m/r/g Lungs:CTA Abdomen:soft, nondistended, nontender Extremities:no pedal edema, some mild UE edema Dialysis Access:RUA AVF +bruit   Labs: BMET Recent Labs  Lab 08/06/18 1326 08/08/18 1557 08/09/18 1647 08/11/18 1406 08/13/18 0707  NA 132* 137 135 134* 136  K 3.5 4.0 3.8 3.6 3.7  CL 95* 96* 97* 97* 98  CO2 23 28 25 24 26   GLUCOSE 155* 167* 99 186* 113*  BUN 53* 68* 98* 80* 56*  CREATININE 5.42* 6.88* 8.52* 7.94* 6.95*  CALCIUM 9.1 10.4* 10.5* 10.2 10.3  PHOS 4.0 4.7* 5.7*  --  5.6*   CBC Recent Labs   Lab 08/06/18 1326 08/08/18 1557 08/11/18 1406 08/13/18 0707  WBC 8.8 10.6* 9.4 9.2  HGB 10.1* 10.0* 8.2* 7.6*  HCT 30.5* 32.0* 25.3* 23.7*  MCV 96.2 98.5 97.3 98.3  PLT 218 199 206 216    @IMGRELPRIORS @ Medications:    . acetaminophen      . Chlorhexidine Gluconate Cloth  6 each Topical Q0600  . cinacalcet  30 mg Oral Q supper  . darbepoetin (ARANESP) injection - DIALYSIS  60 mcg Intravenous Q Mon-HD  . docusate sodium  100 mg Oral Daily  . donepezil  10 mg Oral QHS  . feeding supplement (NEPRO CARB STEADY)  237 mL Oral BID BM  . feeding supplement (PRO-STAT SUGAR FREE 64)  30 mL Oral BID  . insulin aspart  0-9 Units Subcutaneous TID WC  . levothyroxine  50 mcg Oral QAC breakfast  . liraglutide  0.6 mg Subcutaneous Q0600  . memantine  10 mg Oral BID  . midodrine      . midodrine  10 mg Oral Q M,W,F-HD  . multivitamin  1 tablet Oral QHS  . pantoprazole  40 mg Oral Daily  . polyethylene glycol  17 g Oral Daily  . saccharomyces boulardii  250 mg Oral BID  . senna  1 tablet Oral QHS  . sevelamer carbonate  800 mg Oral Q supper  . simvastatin  20  mg Oral QHS  . vitamin B-12  1,000 mcg Oral Daily     Madelon Lips, MD [facility] pgr [phone] 08/13/2018, 8:57 AM

## 2018-08-13 NOTE — Discharge Summary (Addendum)
Physician Discharge Summary  Patient ID: [name] MRN: [id number] DOB/AGE: 1941/03/03 77 y.o.  Admit date: 07/29/2018 Discharge date: 08/13/2018  Discharge Diagnoses:  Principal Problem:   TBI (traumatic brain injury) ([place]) Active Problems:   ESRD on dialysis ([place])   Dyspnea on exertion   Anorexia   Subdural hematoma ([facility])   Diabetes mellitus type 2 in nonobese ([place])   Dementia arising in the senium and presenium ([place])   Anemia of chronic disease   Leukocytosis   Discharged Condition:  Stable   Significant Diagnostic Studies: Ct Head Wo Contrast  Result Date: 07/31/2018 CLINICAL DATA:  Altered level of consciousness. Status post subdural evacuation July 22, 2018. EXAM: CT HEAD WITHOUT CONTRAST TECHNIQUE: Contiguous axial images were obtained from the base of the skull through the vertex without intravenous contrast. COMPARISON:  CT HEAD July 28, 2018 FINDINGS: BRAIN: Similar bilateral holo hemispheric heterogeneous subdural collections measuring to 19 mm LEFT anterior cranial fossa. Linear possible membrane formation bilaterally. Small volume extra-axial pneumocephalus. No rebleed. Mild mass effect on LEFT frontal lobe, 4 mm LEFT or RIGHT midline shift. No ventricular entrapment. No intraparenchymal hemorrhage or acute large vascular territory infarct. Basal cisterns are patent. VASCULAR: Moderate calcific atherosclerosis of the carotid siphons. SKULL: No skull fracture. Bifrontal burr holes with overlying soft tissue swelling and skin staples. No significant scalp soft tissue swelling. SINUSES/ORBITS: Paranasal sinuses are well aerated. Trace RIGHT mastoid effusion.The included ocular globes and orbital contents are non-suspicious. Status post bilateral ocular lens implants. OTHER: None. IMPRESSION: 1. Status post burr hole placement for evacuation of subdural hematomas. Stable residual collections measuring to 19 mm on the LEFT. 2. 4 mm LEFT-to-RIGHT midline shift without  ventricular entrapment. Electronically Signed   By: Elon Alas M.D.   On: 07/31/2018 19:34   Ct Head Wo Contrast  Result Date: 07/28/2018 CLINICAL DATA:  77 year old male with intracranial hemorrhage. Follow-up. Subsequent encounter. EXAM: CT HEAD WITHOUT CONTRAST TECHNIQUE: Contiguous axial images were obtained from the base of the skull through the vertex without intravenous contrast. COMPARISON:  07/24/2018. FINDINGS: Brain: Right-sided subdural drain has been removed. Slight decrease in amount of pneumocephalus. Broad-based complex left-sided loculated appearing subdural collection with maximal thickness left frontal region measuring up to 19 mm versus prior 22.8 mm. Left-sided subdural collection causes local mass effect which is most prominent left frontal region which is compressed posteriorly. Mass-effect upon the left frontal horn left lateral ventricle with midline shift to the right by 4.7 mm similar to prior exam. Complex right-sided subdural hematoma with maximal thickness of 8.9 mm versus prior 13.8 mm. Local mass effect slightly improved. No new intracranial hemorrhage noted. No evidence of acute thrombotic infarct. No primary intracranial mass lesion noted on this unenhanced exam. Vascular: No hyperdense vessel. Skull: Burr-holes frontal region. Sinuses/Orbits: No acute orbital abnormality. Visualized paranasal sinuses are clear. Other: Mastoid air cells and middle ear cavities are clear. IMPRESSION: 1. Right subdural drain has been removed. Slight decrease in degree of pneumocephalus. 2. Broad-based complex left-sided loculated appearing subdural collection with maximal thickness left frontal region measuring up to 19 mm versus prior 22.8 mm. Left-sided subdural collection causes local mass effect which is most prominent left frontal region which is compressed posteriorly. Mass-effect upon the left frontal horn of the left lateral ventricle with midline shift to the right by 4.7 mm  similar to prior exam. 3. Complex right-sided subdural hematoma with maximal thickness of 8.9 mm versus prior 13.8 mm. Local mass effect slightly improved. Electronically Signed  By: Genia Del M.D.   On: 07/28/2018 18:28     Labs:  Basic Metabolic Panel: Recent Labs  Lab 08/08/18 1557 08/09/18 1647 08/11/18 1406 08/13/18 0707  NA 137 135 134* 136  K 4.0 3.8 3.6 3.7  CL 96* 97* 97* 98  CO2 _0 GLUCOSE 167* 99 186* 113*  BUN 68* 98* 80* 56*  CREATININE 6.88* 8.52* 7.94* 6.95*  CALCIUM 10.4* 10.5* 10.2 10.3  PHOS 4.7* 5.7*  --  5.6*    CBC: Recent Labs  Lab 08/08/18 1557 08/11/18 1406 08/13/18 0707  WBC 10.6* 9.4 9.2  HGB 10.0* 8.2* 7.6*  HCT 32.0* 25.3* 23.7*  MCV 98.5 97.3 98.3  PLT 199 206 216    CBG: Recent Labs  Lab 08/12/18 1201 08/12/18 1702 08/12/18 2106 08/13/18 0634 08/13/18 1145  GLUCAP 130* 115* 48 103* 256*    Brief HPI:   [name] is a 77 year old male with history of ESRD-HD MWF, T2DM, recent GI bleed due to AVM, chronic hypertension, dementia, OSA; who was admitted to [facility] on 07/20/2018 with worsening of confusion, headaches and multiple falls. He was found to have acute on chronic large bilateral S DH as well as multiple left rib fractures.  He was evaluated by Dr. Ellene Route and underwent bilateral bur hole evacuation of subdural hemorrhage on 10/29.  Hospital course significant for worsening of cognitive status with confusion as well as sleep-wake disruption.  CT head done 11/04 revealing improvement in right subdural hemorrhage and left subdural hemorrhage now with mass-effect on left frontal region.  Dr. Ellene Route recommended monitoring patient clinically with intermittent CT scans no need for surgical intervention.  Blood pressures were noted to be trending high therefore Metrodin placed on hold.  Patient continued to be limited by bouts of lethargy, deficits in mobility with slight shuffling gaze as well as balance issues and cognitive  deficits affecting ADLs.  CIR recommended for follow-up therapy   Physical Exam  Constitutional: He appears well-developed and well-nourished.  HENT:  Head: Normocephalic and atraumatic.  Mouth/Throat: Oropharynx is clear and moist.  Eyes: Pupils are equal, round, and reactive to light. Conjunctivae and EOM are normal.  Neck: Normal range of motion. Neck supple.  Cardiovascular: Normal rate and regular rhythm.  Pulmonary/Chest: Effort normal and breath sounds normal. No stridor.  Abdominal: Soft. Bowel sounds are normal.  Musculoskeletal: He exhibits no edema.  Neurological: He is alert.  Skin: Skin is warm and dry.  Psychiatric: He has a normal mood and affect. His behavior is normal.  Nursing note and vitals reviewed.    Hospital Course: [name] was admitted to rehab 07/29/2018 for inpatient therapies to consist of PT, ST and OT at least three hours five days a week. Past admission physiatrist, therapy team and rehab RN have worked together to provide customized collaborative inpatient rehab.  For the first few days he continued to have issues with sleep-wake disruption with bouts of lethargy.  CT head was repeated on 11/7 showing improvement in bleed.  Sleep cycle improved his lethargy resolved and endurance levels have improved.  His blood pressures have been monitored on twice daily basis and showed occasional downwards therefore midodrine was resumed prior to hemodialysis on MWF.  Hemodialysis was scheduled at the end of the day to help with tolerance of therapy.   Nutritional supplements were added for low calorie malnutrition.  His p.o. intake continues to be variable affecting his blood sugars.  Diabetes has been monitored  with ac/hs cbg checks and blood sugars controlled to be variable depending upon intake.  He has had issues with constipation and bowel program has been adjusted frequently.  Wife was educated on having a consistent bowel program to avoid IBS type symptoms.  Anemia of chronic disease has been monitored with serial checks and Aranesp was added due to drop in hemoglobin to 7.6.  His respiratory status has been stable and post hemodialysis weight is 137.5 pounds at discharge.  He has made steady progress during his stay and is currently at supervision to min assist.  He will continue to receive further follow-up home health PT, OT, ST and HHRN by [facility] after discharge.    Rehab course: During patient's stay in rehab weekly team conferences were held to monitor patient's progress, set goals and discuss barriers to discharge. At admission, patient required mod assist with mobility and max assist with basic ADLs. He exhibited mild to moderate cognitive deficits affecting recall, functional problem-solving, emergent awareness and safety awareness.  He presented with behaviors consistent with RLAS V11 and needed mod assist to complete basic tasks.  He  has had improvement in activity tolerance, balance, postural control as well as ability to compensate for deficits.  He continues to be limited by decreased activity tolerance, cognitive deficits as well as problems with short-term memory preventing carryover of day-to-day tasks.  He is able to complete ADL tasks with min assist.  He requires supervision for transfers and improve mobility.  He is able to ambulate 100 feet with supervision verbal cues and use of Rollator.  Wife has been educated regarding safety concerns and need for supervision to min assist.    Disposition: Home  Diet: Renal/Carb Modified. 1200 cc FR/day  Special Instructions: 1. Continue to monitor BS 2-3 times a day.  2.  Use Zyrtec daily to help with pruritis.  Use Sarna lotion prn itching.    Discharge Instructions    Ambulatory referral to Physical Medicine Rehab   Complete by:  As directed    1-2 weeks transitional care appt     Allergies as of 08/13/2018      Reactions   Penicillins Rash   Has patient had a PCN  reaction causing immediate rash, facial/tongue/throat swelling, SOB or lightheadedness with hypotension: Yes Has patient had a PCN reaction causing severe rash involving mucus membranes or skin necrosis: Yes Has patient had a PCN reaction that required hospitalization: No Has patient had a PCN reaction occurring within the last 10 years: No If all of the above answers are "NO", then may proceed with Cephalosporin use.      Medication List    STOP taking these medications   aspirin 325 MG tablet   nystatin-triamcinolone cream Commonly known as:  MYCOLOG II     TAKE these medications   acetaminophen 325 MG tablet Commonly known as:  TYLENOL Take 1-2 tablets (325-650 mg total) by mouth every 4 (four) hours as needed for mild pain. What changed:    medication strength  how much to take  when to take this  reasons to take this   allopurinol 100 MG tablet Commonly known as:  ZYLOPRIM Take 100 mg by mouth daily as needed (pain).   Carboxymethylcellulose Sod PF 0.25 % Soln Place 1 drop into both eyes as needed (dry eyes).   cinacalcet 30 MG tablet Commonly known as:  SENSIPAR Take 1 tablet (30 mg total) by mouth daily with supper.   clobetasol 0.05 % external  solution Commonly known as:  TEMOVATE Apply 1 application topically 2 (two) times daily as needed (irritation). Apply to scalp   denosumab 60 MG/ML Sosy injection Commonly known as:  PROLIA Inject 60 mg into the skin every 6 (six) months.   docusate sodium 100 MG capsule Commonly known as:  COLACE Take 1 capsule (100 mg total) by mouth daily.   donepezil 10 MG tablet Commonly known as:  ARICEPT Take 1 tablet (10 mg total) by mouth at bedtime.   feeding supplement (NEPRO CARB STEADY) Liqd Take 237 mLs by mouth daily.   Fluocinolone Acetonide Scalp 0.01 % Oil Apply 1 application topically at bedtime. Apply to scalp   fluticasone 0.05 % cream Commonly known as:  CUTIVATE Apply 1 application topically 2 (two)  times daily as needed (irritation). Apply to face   fluticasone 50 MCG/ACT nasal spray Commonly known as:  FLONASE Place 1 spray into both nostrils daily. What changed:    when to take this  reasons to take this   FREESTYLE FREEDOM LITE w/Device Kit Use to check blood sugar 2 times per day dx code E11.65   freestyle lancets Use as instructed to check blood sugar 2 times per day dx code E11.65   glucose blood test strip Use as instructed to check blood sugar 3 times daily. What changed:  Another medication with the same name was removed. Continue taking this medication, and follow the directions you see here.   Insulin Pen Needle 31G X 5 MM Misc Use with Victoza pen once a day   ketoconazole 2 % shampoo Commonly known as:  NIZORAL Apply 1 application topically 2 (two) times a week. What changed:  when to take this   levothyroxine 50 MCG tablet Commonly known as:  SYNTHROID, LEVOTHROID Take 1 tablet (50 mcg total) by mouth daily.   liraglutide 18 MG/3ML Sopn Commonly known as:  VICTOZA Inject 0.1 mLs (0.6 mg total) into the skin daily.   loratadine 10 MG tablet Commonly known as:  CLARITIN Take 10 mg by mouth daily as needed for allergies. Notes to patient:  OR Zrytec   memantine 10 MG tablet Commonly known as:  NAMENDA Take 1 tablet (10 mg total) by mouth 2 (two) times daily. What changed:    medication strength  how much to take  how to take this  when to take this  additional instructions   midodrine 10 MG tablet Commonly known as:  PROAMATINE Take one pill prior to hemodialysis on MWF What changed:    how much to take  how to take this  when to take this  additional instructions Notes to patient:  ONLY prior to hemodialysis at this time.    multivitamin with minerals tablet Take 1 tablet by mouth daily.   Naftifine HCl 2 % Crea Apply to feet daily for fungal infection What changed:    how much to take  how to take this  when to take  this  reasons to take this  additional instructions   omeprazole 40 MG capsule Commonly known as:  PRILOSEC Take 1 capsule (40 mg total) by mouth daily.   polyethylene glycol packet Commonly known as:  MIRALAX / GLYCOLAX Take 17 g by mouth daily. What changed:    when to take this  reasons to take this   saccharomyces boulardii 250 MG capsule Commonly known as:  FLORASTOR Take 1 capsule (250 mg total) by mouth 2 (two) times daily.   senna 8.6 MG Tabs tablet  Commonly known as:  SENOKOT Take 1 tablet (8.6 mg total) by mouth at bedtime.   sevelamer carbonate 800 MG tablet Commonly known as:  RENVELA Take 1 tablet (800 mg total) by mouth 3 (three) times daily with meals. What changed:  how much to take   simvastatin 20 MG tablet Commonly known as:  ZOCOR Take 20 mg by mouth at bedtime.   TRADJENTA PO Take 5 mg by mouth daily.   vitamin B-12 1000 MCG tablet Commonly known as:  CYANOCOBALAMIN Take 1 tablet (1,000 mcg total) by mouth daily.      Follow-up Information    Jamse Arn, MD Follow up.   Specialty:  Physical Medicine and Rehabilitation Why:  Office will call you with follow up appointment Contact information: [address] [place] [address] [phone]        Kristeen Miss, MD. Call in 1 day(s).   Specialty:  Neurosurgery Why:  for follow up appointment Contact information: [address] [place] [address] [phone]        Elby Showers, MD Follow up on 08/19/2018.   Specialty:  Internal Medicine Why:  Appointment @ 12:00  Contact information: [address] [place] [address] [phone]           Signed: Bary Leriche 08/14/2018, 4:00 PM   Patient seen and examined by me on day of discharge. Delice Lesch, MD, ABPMR

## 2018-08-14 ENCOUNTER — Telehealth: Payer: Self-pay

## 2018-08-14 NOTE — Telephone Encounter (Signed)
error 

## 2018-08-15 ENCOUNTER — Telehealth: Payer: Self-pay

## 2018-08-15 ENCOUNTER — Telehealth: Payer: Self-pay | Admitting: *Deleted

## 2018-08-15 DIAGNOSIS — N2581 Secondary hyperparathyroidism of renal origin: Secondary | ICD-10-CM | POA: Diagnosis not present

## 2018-08-15 DIAGNOSIS — N186 End stage renal disease: Secondary | ICD-10-CM | POA: Diagnosis not present

## 2018-08-15 DIAGNOSIS — D509 Iron deficiency anemia, unspecified: Secondary | ICD-10-CM | POA: Diagnosis not present

## 2018-08-15 NOTE — Telephone Encounter (Signed)
Transitional Care call-Shirley (wife)    1. Are you/is patient experiencing any problems since coming home? No Are there any questions regarding any aspect of care? No 2. Are there any questions regarding medications administration/dosing? No  Are meds being taken as prescribed? Patient should review meds with caller to confirm 3. Have there been any falls? No 4. Has Home Health been to the house and/or have they contacted you? Yes If not, have you tried to contact them? Can we help you contact them? 5. Are bowels and bladder emptying properly? Bowels no(given Miralax and [place], bladder yes Are there any unexpected incontinence issues? If applicable, is patient following bowel/bladder programs? 6. Any fevers, problems with breathing, unexpected pain? No 7. Are there any skin problems or new areas of breakdown? Blister on stomach but healing now 8. Has the patient/family member arranged specialty MD follow up (ie cardiology/neurology/renal/surgical/etc)? Yes Can we help arrange? 9. Does the patient need any other services or support that we can help arrange? No 10. Are caregivers following through as expected in assisting the patient? Yes 11. Has the patient quit smoking, drinking alcohol, or using drugs as recommended? Yes  Appointment time, arrive time 10:00am for 10:20am appt on 08/20/18 with Dr. Posey Pronto [phone]

## 2018-08-15 NOTE — Telephone Encounter (Signed)
[place], [facility] left a message asking for verbal order to switch initial home health speech evaluation from this week to next week. Verbal order required according to medicare/medicaid guidelines. Medical record reviewed. Social work note reviewed.  Verbal orders given per office protocol.

## 2018-08-17 DIAGNOSIS — D509 Iron deficiency anemia, unspecified: Secondary | ICD-10-CM | POA: Diagnosis not present

## 2018-08-17 DIAGNOSIS — N2581 Secondary hyperparathyroidism of renal origin: Secondary | ICD-10-CM | POA: Diagnosis not present

## 2018-08-17 DIAGNOSIS — N186 End stage renal disease: Secondary | ICD-10-CM | POA: Diagnosis not present

## 2018-08-19 ENCOUNTER — Ambulatory Visit: Payer: Medicare Other | Admitting: Internal Medicine

## 2018-08-19 ENCOUNTER — Other Ambulatory Visit: Payer: Self-pay

## 2018-08-19 DIAGNOSIS — D509 Iron deficiency anemia, unspecified: Secondary | ICD-10-CM | POA: Diagnosis not present

## 2018-08-19 DIAGNOSIS — Z992 Dependence on renal dialysis: Secondary | ICD-10-CM | POA: Diagnosis not present

## 2018-08-19 DIAGNOSIS — T82898A Other specified complication of vascular prosthetic devices, implants and grafts, initial encounter: Secondary | ICD-10-CM | POA: Diagnosis not present

## 2018-08-19 DIAGNOSIS — I871 Compression of vein: Secondary | ICD-10-CM | POA: Diagnosis not present

## 2018-08-19 DIAGNOSIS — N2581 Secondary hyperparathyroidism of renal origin: Secondary | ICD-10-CM | POA: Diagnosis not present

## 2018-08-19 DIAGNOSIS — N186 End stage renal disease: Secondary | ICD-10-CM | POA: Diagnosis not present

## 2018-08-19 MED ORDER — LIRAGLUTIDE 18 MG/3ML ~~LOC~~ SOPN: 0.6000 mg | PEN_INJECTOR | Freq: Every day | SUBCUTANEOUS | 1 refills | Status: DC

## 2018-08-20 ENCOUNTER — Encounter: Payer: Medicare Other | Admitting: Physical Medicine & Rehabilitation

## 2018-08-22 DIAGNOSIS — D509 Iron deficiency anemia, unspecified: Secondary | ICD-10-CM | POA: Diagnosis not present

## 2018-08-22 DIAGNOSIS — N186 End stage renal disease: Secondary | ICD-10-CM | POA: Diagnosis not present

## 2018-08-22 DIAGNOSIS — N2581 Secondary hyperparathyroidism of renal origin: Secondary | ICD-10-CM | POA: Diagnosis not present

## 2018-08-24 DIAGNOSIS — E1122 Type 2 diabetes mellitus with diabetic chronic kidney disease: Secondary | ICD-10-CM | POA: Diagnosis not present

## 2018-08-24 DIAGNOSIS — N186 End stage renal disease: Secondary | ICD-10-CM | POA: Diagnosis not present

## 2018-08-24 DIAGNOSIS — Z992 Dependence on renal dialysis: Secondary | ICD-10-CM | POA: Diagnosis not present

## 2018-08-25 DIAGNOSIS — N2581 Secondary hyperparathyroidism of renal origin: Secondary | ICD-10-CM | POA: Diagnosis not present

## 2018-08-25 DIAGNOSIS — E1129 Type 2 diabetes mellitus with other diabetic kidney complication: Secondary | ICD-10-CM | POA: Diagnosis not present

## 2018-08-25 DIAGNOSIS — D509 Iron deficiency anemia, unspecified: Secondary | ICD-10-CM | POA: Diagnosis not present

## 2018-08-25 DIAGNOSIS — D631 Anemia in chronic kidney disease: Secondary | ICD-10-CM | POA: Diagnosis not present

## 2018-08-25 DIAGNOSIS — N186 End stage renal disease: Secondary | ICD-10-CM | POA: Diagnosis not present

## 2018-08-26 ENCOUNTER — Encounter: Payer: Medicare Other | Attending: Physical Medicine & Rehabilitation | Admitting: Registered Nurse

## 2018-08-26 ENCOUNTER — Ambulatory Visit (INDEPENDENT_AMBULATORY_CARE_PROVIDER_SITE_OTHER): Payer: Medicare Other | Admitting: Internal Medicine

## 2018-08-26 ENCOUNTER — Encounter: Payer: Self-pay | Admitting: Registered Nurse

## 2018-08-26 VITALS — BP 120/80 | HR 78 | Temp 98.3°F | Ht 71.0 in | Wt 142.0 lb

## 2018-08-26 VITALS — BP 127/76 | HR 79 | Ht 71.0 in | Wt 143.0 lb

## 2018-08-26 DIAGNOSIS — E119 Type 2 diabetes mellitus without complications: Secondary | ICD-10-CM

## 2018-08-26 DIAGNOSIS — S065X9A Traumatic subdural hemorrhage with loss of consciousness of unspecified duration, initial encounter: Secondary | ICD-10-CM | POA: Diagnosis not present

## 2018-08-26 DIAGNOSIS — Z992 Dependence on renal dialysis: Secondary | ICD-10-CM

## 2018-08-26 DIAGNOSIS — N186 End stage renal disease: Secondary | ICD-10-CM | POA: Diagnosis not present

## 2018-08-26 DIAGNOSIS — E538 Deficiency of other specified B group vitamins: Secondary | ICD-10-CM

## 2018-08-26 DIAGNOSIS — E1151 Type 2 diabetes mellitus with diabetic peripheral angiopathy without gangrene: Secondary | ICD-10-CM | POA: Insufficient documentation

## 2018-08-26 DIAGNOSIS — I120 Hypertensive chronic kidney disease with stage 5 chronic kidney disease or end stage renal disease: Secondary | ICD-10-CM | POA: Diagnosis not present

## 2018-08-26 DIAGNOSIS — R413 Other amnesia: Secondary | ICD-10-CM | POA: Diagnosis not present

## 2018-08-26 DIAGNOSIS — Z833 Family history of diabetes mellitus: Secondary | ICD-10-CM | POA: Diagnosis not present

## 2018-08-26 DIAGNOSIS — K5900 Constipation, unspecified: Secondary | ICD-10-CM | POA: Diagnosis not present

## 2018-08-26 DIAGNOSIS — Z87891 Personal history of nicotine dependence: Secondary | ICD-10-CM | POA: Diagnosis not present

## 2018-08-26 DIAGNOSIS — S065XAA Traumatic subdural hemorrhage with loss of consciousness status unknown, initial encounter: Secondary | ICD-10-CM

## 2018-08-26 DIAGNOSIS — S069X0A Unspecified intracranial injury without loss of consciousness, initial encounter: Secondary | ICD-10-CM | POA: Diagnosis not present

## 2018-08-26 DIAGNOSIS — E1122 Type 2 diabetes mellitus with diabetic chronic kidney disease: Secondary | ICD-10-CM | POA: Diagnosis not present

## 2018-08-26 DIAGNOSIS — G4730 Sleep apnea, unspecified: Secondary | ICD-10-CM | POA: Diagnosis not present

## 2018-08-26 DIAGNOSIS — F0390 Unspecified dementia without behavioral disturbance: Secondary | ICD-10-CM | POA: Insufficient documentation

## 2018-08-26 DIAGNOSIS — Z8249 Family history of ischemic heart disease and other diseases of the circulatory system: Secondary | ICD-10-CM | POA: Insufficient documentation

## 2018-08-26 DIAGNOSIS — I2510 Atherosclerotic heart disease of native coronary artery without angina pectoris: Secondary | ICD-10-CM | POA: Insufficient documentation

## 2018-08-26 DIAGNOSIS — I10 Essential (primary) hypertension: Secondary | ICD-10-CM

## 2018-08-26 NOTE — Progress Notes (Signed)
   Subjective:    Patient ID: [name], male    DOB: September 04, 1941, 77 y.o.   MRN: [id number]  HPI Here today for hospital follow up. Has OT, PT, and an aide to help at home. Walking with walker.  He was discharged home from the hospital November 20.  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  Subsequently required bur hole placement for evacuation of subdural hematomas.  Longstanding history of end-stage renal disease treated with dialysis 3 times weekly.  Was admitted to rehab center November 5.  Taking dialysis 3 times a week. Says he is itching. Takes Zyrtec.  Alert and oriented to month, year, not day of week.  Sees Nephrologist at dialysis on Wednesday. Was on ASA 325 daily but was stopped on rehab.    Review of Systems no new complaints, very talkative today     Objective:   Physical Exam Neck is supple without JVD.  Chest clear.  Cardiac exam regular rate and rhythm normal S1 and S2.  Extremities without edema.  He is alert.  He ambulates with the help of a walker.       Assessment & Plan:  Status post bur hole placement for evacuation of bilateral subdural hematomas-has appointment in February to see Dr. Posey Pronto, rehab physician  End-stage renal disease-receives dialysis 3 times weekly  Memory loss longstanding  History of B12 deficiency  Essential hypertension  Diabetes mellitus followed by Dr. Dwyane Dee and has an appointment to see him in February Plan: Home physical therapy has been ordered.

## 2018-08-26 NOTE — Progress Notes (Signed)
Subjective:    Patient ID: KORTLAND NICHOLS, male    DOB: 1941-07-01, 77 y.o.   MRN: [id number]  HPI: [name] is a 77 y.o. male who is here for Transitional Care Visit in  follow up of his TBI, Subdural Hematoma, Type 2 DM, ESRD on hemodialysis and Dementia. He's been home with [place]  with [facility]. He denies pain. He rated his pain 3. Appetite poor drinking 2 cans of Nepro a day his wife reports.   Wife in room, all questions answered.   Pain Inventory Average Pain 4 Pain Right Now 3 My pain is na  In the last 24 hours, has pain interfered with the following? General activity 5 Relation with others 0 Enjoyment of life 8 What TIME of day is your pain at its worst? na Sleep (in general) Fair  Pain is worse with: walking, bending and standing Pain improves with: rest, heat/ice and medication Relief from Meds: 5  Mobility walk with assistance use a walker ability to climb steps?  yes do you drive?  no  Function disabled: date disabled .  Neuro/Psych bowel control problems weakness trouble walking dizziness depression anxiety  Prior Studies Any changes since last visit?  no  Physicians involved in your care Any changes since last visit?  no   Family History  Problem Relation Age of Onset  . Aneurysm Mother   . Heart disease Father   . Stroke Father   . Hypertension Father   . Diabetes Father   . Dementia Neg Hx   . Colon cancer Neg Hx   . Esophageal cancer Neg Hx   . Pancreatic cancer Neg Hx   . Prostate cancer Neg Hx   . Rectal cancer Neg Hx   . Stomach cancer Neg Hx    Social History   Socioeconomic History  . Marital status: Married    Spouse name: [name] Mood  . Number of children: 1  . Years of education: 82  . Highest education level: Not on file  Occupational History  . Occupation: Retired  Scientific laboratory technician  . Financial resource strain: Not on file  . Food insecurity:    Worry: Not on file    Inability: Not on  file  . Transportation needs:    Medical: No    Non-medical: No  Tobacco Use  . Smoking status: Former Smoker    Last attempt to quit: 09/24/1994    Years since quitting: 23.9  . Smokeless tobacco: Never Used  Substance and Sexual Activity  . Alcohol use: No    Alcohol/week: 0.0 standard drinks    Comment: Quit Oct. 1977 ("somewhat heavy")  . Drug use: No  . Sexual activity: Not on file  Lifestyle  . Physical activity:    Days per week: 3 days    Minutes per session: 10 min  . Stress: Not at all  Relationships  . Social connections:    Talks on phone: Not on file    Gets together: Not on file    Attends religious service: Not on file    Active member of club or organization: Not on file    Attends meetings of clubs or organizations: Not on file    Relationship status: Not on file  Other Topics Concern  . Not on file  Social History Narrative   Lives at home with wife.   Caffeine use: Drinks no soda or tea.    Drinks 1 cup coffee/week  Right handed   Past Surgical History:  Procedure Laterality Date  . aortobifemoral bypass    . AV FISTULA PLACEMENT Left 12/01/2013   Procedure: ARTERIOVENOUS (AV) FISTULA CREATION- LEFT BRACHIOCEPHALIC;  Surgeon: Angelia Mould, MD;  Location: [place];  Service: Vascular;  Laterality: Left;  . [place] TRANSPOSITION Right 07/27/2014   Procedure: BASCILIC VEIN TRANSPOSITION;  Surgeon: Angelia Mould, MD;  Location: [place];  Service: Vascular;  Laterality: Right;  . BREAST SURGERY     left - granulomatous mastitis  . BURR HOLE Bilateral 07/22/2018   Procedure: BILATERAL BURR HOLES;  Surgeon: Kristeen Miss, MD;  Location: [place];  Service: Neurosurgery;  Laterality: Bilateral;  . COLONOSCOPY    . 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 ENTEROSCOPY N/A 06/03/2018   Procedure: ENTEROSCOPY;  Surgeon: Lavena Bullion, DO;  Location: [place];  Service: Gastroenterology;  Laterality: N/A;  . EYE SURGERY Bilateral     cataracts  . HEMODIALYSIS INPATIENT  01/17/2018      . HOT HEMOSTASIS N/A 06/03/2018   Procedure: HOT HEMOSTASIS (ARGON PLASMA COAGULATION/BICAP);  Surgeon: Lavena Bullion, DO;  Location[facility] ENDOSCOPY;  Service: Gastroenterology;  Laterality: N/A;  . REVISON OF ARTERIOVENOUS FISTULA Left 02/09/2014   Procedure: REVISON OF LEFT ARTERIOVENOUS FISTULA - RESECTION OF RENDUNDANT VEIN;  Surgeon: Angelia Mould, MD;  Location: [place];  Service: Vascular;  Laterality: Left;  . SBO with lysis adhesions    . SHUNTOGRAM Left 04/19/2014   Procedure: FISTULOGRAM;  Surgeon: Angelia Mould, MD;  Location: [facility] CATH LAB;  Service: Cardiovascular;  Laterality: Left;  . UNILATERAL UPPER EXTREMEITY ANGIOGRAM N/A 07/12/2014   Procedure: UNILATERAL UPPER Anselmo Rod;  Surgeon: Angelia Mould, MD;  Location: [facility] C[facility] LAB;  Service: Cardiovascular;  Laterality: N/A;   Past Medical History:  Diagnosis Date  . Allergy   . Anemia   . Arthritis   . Cataract    bil cateracts removed  . Coronary artery disease   . Dementia arising in the senium and presenium ([place])   . Diabetes mellitus    Type 2  . Diverticulitis   . ED (erectile dysfunction)   . Elevated homocysteine ([place])   . ESRD (end stage renal disease) on dialysis ([place]) 03/2015  . GERD (gastroesophageal reflux disease)    pepto   . Gout   . Hyperlipidemia   . Hypertension   . Hypothyroidism   . Pneumonia   . PVD (peripheral vascular disease) ([place])    has plastic aorta  . Renal insufficiency   . Seasonal allergies   . Shortness of breath dyspnea   . Sleep apnea    does not wear c-pap  . Thyroid disease    There were no vitals taken for this visit.  Opioid Risk Score:   Fall Risk Score:  `1  Depression screen PHQ 2/9  Depression screen PHQ 2/9 08/29/2017  Decreased Interest 0  Down, Depressed, Hopeless 0  PHQ - 2 Score 0  Some recent data might be hidden     Review of Systems  Constitutional: Positive  for appetite change and unexpected weight change.  HENT: Negative.   Eyes: Negative.   Respiratory: Negative.   Cardiovascular: Negative.   Gastrointestinal: Positive for constipation and diarrhea.  Endocrine: Negative.   Genitourinary: Negative.   Musculoskeletal: Positive for arthralgias, back pain and gait problem.  Skin: Positive for rash.  Allergic/Immunologic: Negative.   Neurological: Positive for dizziness and  weakness.  Hematological: Negative.   Psychiatric/Behavioral: Positive for dysphoric mood. The patient is nervous/anxious.   All other systems reviewed and are negative.      Objective:   Physical Exam  Constitutional: He is oriented to person, place, and time. He appears well-developed and well-nourished.  HENT:  Head: Normocephalic and atraumatic.  Neck: Normal range of motion. Neck supple.  Cardiovascular: Normal rate and regular rhythm.  Pulmonary/Chest: Effort normal and breath sounds normal.  Musculoskeletal:  Normal Muscle Bulk and Muscle Testing Reveals: Upper Extremities: Full ROM and Muscle Strength 4/5  Lumbar Paraspinal Tenderness: L-4-L-5 Lower Extremities: Full ROM and Muscle Strength 5/5 Arises from Table Slowly using walker for support Normal Based Gait     Neurological: He is alert and oriented to person, place, and time.  Skin: Skin is warm and dry.  Psychiatric: He has a normal mood and affect. His behavior is normal.  Nursing note and vitals reviewed.         Assessment & Plan:  1. TBI: Continue with Home Health Therapies: Neurology Following.  2. Subdural Hematoma: Has a F/U appointment with Neurosurgery. Continue Home Health Therapies and Continue to Monitor.  3. Dementia: Neurology Following. Continue Namenda 4. Type 2 DM: PCP Following. 5.ESRD: On Hemodialysis: Nephrology Following.  30 minutes of face to face patient care time was spent during this visit. All questions were encouraged and answered.  F/U in 4-6 weeks with  Dr. Posey Pronto.

## 2018-08-27 DIAGNOSIS — N186 End stage renal disease: Secondary | ICD-10-CM | POA: Diagnosis not present

## 2018-08-27 DIAGNOSIS — D631 Anemia in chronic kidney disease: Secondary | ICD-10-CM | POA: Diagnosis not present

## 2018-08-27 DIAGNOSIS — E1129 Type 2 diabetes mellitus with other diabetic kidney complication: Secondary | ICD-10-CM | POA: Diagnosis not present

## 2018-08-27 DIAGNOSIS — N2581 Secondary hyperparathyroidism of renal origin: Secondary | ICD-10-CM | POA: Diagnosis not present

## 2018-08-27 DIAGNOSIS — D509 Iron deficiency anemia, unspecified: Secondary | ICD-10-CM | POA: Diagnosis not present

## 2018-08-27 LAB — COMPLETE METABOLIC PANEL WITH GFR
AG Ratio: 1.5 (calc) (ref 1.0–2.5)
ALT: 10 U/L (ref 9–46)
AST: 23 U/L (ref 10–35)
Albumin: 3.8 g/dL (ref 3.6–5.1)
Alkaline phosphatase (APISO): 116 U/L — ABNORMAL HIGH (ref 40–115)
BUN/Creatinine Ratio: 5 (calc) — ABNORMAL LOW (ref 6–22)
BUN: 28 mg/dL — ABNORMAL HIGH (ref 7–25)
CO2: 27 mmol/L (ref 20–32)
Calcium: 10 mg/dL (ref 8.6–10.3)
Chloride: 90 mmol/L — ABNORMAL LOW (ref 98–110)
Creat: 6.11 mg/dL — ABNORMAL HIGH (ref 0.70–1.18)
GFR, Est African American: 9 mL/min/{1.73_m2} — ABNORMAL LOW (ref >=60)
GFR, Est Non African American: 8 mL/min/{1.73_m2} — ABNORMAL LOW (ref >=60)
Globulin: 2.5 g/dL (calc) (ref 1.9–3.7)
Glucose, Bld: 91 mg/dL (ref 65–99)
Potassium: 4.2 mmol/L (ref 3.5–5.3)
Sodium: 140 mmol/L (ref 135–146)
Total Bilirubin: 0.3 mg/dL (ref 0.2–1.2)
Total Protein: 6.3 g/dL (ref 6.1–8.1)

## 2018-08-27 LAB — CBC WITH DIFFERENTIAL/PLATELET
Basophils Absolute: 70 cells/uL (ref 0–200)
Basophils Relative: 0.8 %
Eosinophils Absolute: 183 cells/uL (ref 15–500)
Eosinophils Relative: 2.1 %
HCT: 26.9 % — ABNORMAL LOW (ref 38.5–50.0)
Hemoglobin: 8.7 g/dL — ABNORMAL LOW (ref 13.2–17.1)
Lymphs Abs: 2340 cells/uL (ref 850–3900)
MCH: 32.3 pg (ref 27.0–33.0)
MCHC: 32.3 g/dL (ref 32.0–36.0)
MCV: 100 fL (ref 80.0–100.0)
MPV: 10.9 fL (ref 7.5–12.5)
Monocytes Relative: 7.8 %
Neutro Abs: 5429 cells/uL (ref 1500–7800)
Neutrophils Relative %: 62.4 %
Platelets: 188 10*3/uL (ref 140–400)
RBC: 2.69 10*6/uL — ABNORMAL LOW (ref 4.20–5.80)
RDW: 15.5 % — ABNORMAL HIGH (ref 11.0–15.0)
Total Lymphocyte: 26.9 %
WBC mixed population: 679 cells/uL (ref 200–950)
WBC: 8.7 10*3/uL (ref 3.8–10.8)

## 2018-08-27 LAB — VITAMIN B12: Vitamin B-12: 1157 pg/mL — ABNORMAL HIGH (ref 200–1100)

## 2018-08-27 LAB — TSH: TSH: 0.77 mIU/L (ref 0.40–4.50)

## 2018-08-28 DIAGNOSIS — E11622 Type 2 diabetes mellitus with other skin ulcer: Secondary | ICD-10-CM | POA: Diagnosis not present

## 2018-08-28 DIAGNOSIS — F0390 Unspecified dementia without behavioral disturbance: Secondary | ICD-10-CM | POA: Diagnosis not present

## 2018-08-28 DIAGNOSIS — I739 Peripheral vascular disease, unspecified: Secondary | ICD-10-CM | POA: Diagnosis not present

## 2018-08-28 DIAGNOSIS — R2689 Other abnormalities of gait and mobility: Secondary | ICD-10-CM

## 2018-08-28 DIAGNOSIS — L89151 Pressure ulcer of sacral region, stage 1: Secondary | ICD-10-CM

## 2018-08-28 DIAGNOSIS — S069X9D Unspecified intracranial injury with loss of consciousness of unspecified duration, subsequent encounter: Secondary | ICD-10-CM | POA: Diagnosis not present

## 2018-08-28 DIAGNOSIS — N186 End stage renal disease: Secondary | ICD-10-CM | POA: Diagnosis not present

## 2018-08-28 DIAGNOSIS — S2242XD Multiple fractures of ribs, left side, subsequent encounter for fracture with routine healing: Secondary | ICD-10-CM | POA: Diagnosis not present

## 2018-08-28 DIAGNOSIS — Z9181 History of falling: Secondary | ICD-10-CM

## 2018-08-28 DIAGNOSIS — M6281 Muscle weakness (generalized): Secondary | ICD-10-CM

## 2018-08-28 DIAGNOSIS — Z992 Dependence on renal dialysis: Secondary | ICD-10-CM | POA: Diagnosis not present

## 2018-08-28 DIAGNOSIS — Z794 Long term (current) use of insulin: Secondary | ICD-10-CM | POA: Diagnosis not present

## 2018-08-28 DIAGNOSIS — L98492 Non-pressure chronic ulcer of skin of other sites with fat layer exposed: Secondary | ICD-10-CM | POA: Diagnosis not present

## 2018-08-28 DIAGNOSIS — I120 Hypertensive chronic kidney disease with stage 5 chronic kidney disease or end stage renal disease: Secondary | ICD-10-CM | POA: Diagnosis not present

## 2018-08-28 DIAGNOSIS — R41841 Cognitive communication deficit: Secondary | ICD-10-CM | POA: Diagnosis not present

## 2018-08-29 DIAGNOSIS — D509 Iron deficiency anemia, unspecified: Secondary | ICD-10-CM | POA: Diagnosis not present

## 2018-08-29 DIAGNOSIS — D631 Anemia in chronic kidney disease: Secondary | ICD-10-CM | POA: Diagnosis not present

## 2018-08-29 DIAGNOSIS — E1129 Type 2 diabetes mellitus with other diabetic kidney complication: Secondary | ICD-10-CM | POA: Diagnosis not present

## 2018-08-29 DIAGNOSIS — N186 End stage renal disease: Secondary | ICD-10-CM | POA: Diagnosis not present

## 2018-08-29 DIAGNOSIS — N2581 Secondary hyperparathyroidism of renal origin: Secondary | ICD-10-CM | POA: Diagnosis not present

## 2018-09-01 ENCOUNTER — Telehealth: Payer: Self-pay

## 2018-09-01 DIAGNOSIS — D631 Anemia in chronic kidney disease: Secondary | ICD-10-CM | POA: Diagnosis not present

## 2018-09-01 DIAGNOSIS — N186 End stage renal disease: Secondary | ICD-10-CM | POA: Diagnosis not present

## 2018-09-01 DIAGNOSIS — N2581 Secondary hyperparathyroidism of renal origin: Secondary | ICD-10-CM | POA: Diagnosis not present

## 2018-09-01 DIAGNOSIS — D509 Iron deficiency anemia, unspecified: Secondary | ICD-10-CM | POA: Diagnosis not present

## 2018-09-01 DIAGNOSIS — E1129 Type 2 diabetes mellitus with other diabetic kidney complication: Secondary | ICD-10-CM | POA: Diagnosis not present

## 2018-09-01 NOTE — Telephone Encounter (Signed)
Patient has been approved for Prolia and would like to get this on his next OV 09/11/18

## 2018-09-01 NOTE — Telephone Encounter (Signed)
Olivia Mackie from [facility] is calling to request home visit to assist patient with shower once a week. CB# [phone]

## 2018-09-01 NOTE — Telephone Encounter (Signed)
That is fine. Please call back and give approval.

## 2018-09-01 NOTE — Telephone Encounter (Signed)
Gave verbal orders to tracy.

## 2018-09-03 DIAGNOSIS — N2581 Secondary hyperparathyroidism of renal origin: Secondary | ICD-10-CM | POA: Diagnosis not present

## 2018-09-03 DIAGNOSIS — D509 Iron deficiency anemia, unspecified: Secondary | ICD-10-CM | POA: Diagnosis not present

## 2018-09-03 DIAGNOSIS — E1129 Type 2 diabetes mellitus with other diabetic kidney complication: Secondary | ICD-10-CM | POA: Diagnosis not present

## 2018-09-03 DIAGNOSIS — N186 End stage renal disease: Secondary | ICD-10-CM | POA: Diagnosis not present

## 2018-09-03 DIAGNOSIS — D631 Anemia in chronic kidney disease: Secondary | ICD-10-CM | POA: Diagnosis not present

## 2018-09-05 DIAGNOSIS — N2581 Secondary hyperparathyroidism of renal origin: Secondary | ICD-10-CM | POA: Diagnosis not present

## 2018-09-05 DIAGNOSIS — D509 Iron deficiency anemia, unspecified: Secondary | ICD-10-CM | POA: Diagnosis not present

## 2018-09-05 DIAGNOSIS — E1129 Type 2 diabetes mellitus with other diabetic kidney complication: Secondary | ICD-10-CM | POA: Diagnosis not present

## 2018-09-05 DIAGNOSIS — N186 End stage renal disease: Secondary | ICD-10-CM | POA: Diagnosis not present

## 2018-09-05 DIAGNOSIS — D631 Anemia in chronic kidney disease: Secondary | ICD-10-CM | POA: Diagnosis not present

## 2018-09-08 DIAGNOSIS — D631 Anemia in chronic kidney disease: Secondary | ICD-10-CM | POA: Diagnosis not present

## 2018-09-08 DIAGNOSIS — E1129 Type 2 diabetes mellitus with other diabetic kidney complication: Secondary | ICD-10-CM | POA: Diagnosis not present

## 2018-09-08 DIAGNOSIS — D509 Iron deficiency anemia, unspecified: Secondary | ICD-10-CM | POA: Diagnosis not present

## 2018-09-08 DIAGNOSIS — N186 End stage renal disease: Secondary | ICD-10-CM | POA: Diagnosis not present

## 2018-09-08 DIAGNOSIS — N2581 Secondary hyperparathyroidism of renal origin: Secondary | ICD-10-CM | POA: Diagnosis not present

## 2018-09-09 ENCOUNTER — Other Ambulatory Visit (INDEPENDENT_AMBULATORY_CARE_PROVIDER_SITE_OTHER): Payer: Medicare Other

## 2018-09-09 ENCOUNTER — Other Ambulatory Visit: Payer: Medicare Other

## 2018-09-09 DIAGNOSIS — E1165 Type 2 diabetes mellitus with hyperglycemia: Secondary | ICD-10-CM | POA: Diagnosis not present

## 2018-09-09 DIAGNOSIS — N186 End stage renal disease: Secondary | ICD-10-CM | POA: Diagnosis not present

## 2018-09-09 DIAGNOSIS — Z992 Dependence on renal dialysis: Secondary | ICD-10-CM

## 2018-09-09 LAB — GLUCOSE, RANDOM: Glucose, Bld: 67 mg/dL — ABNORMAL LOW (ref 70–99)

## 2018-09-09 LAB — CALCIUM: Calcium: 9.5 mg/dL (ref 8.4–10.5)

## 2018-09-09 LAB — HEMOGLOBIN A1C: Hgb A1c MFr Bld: 5.4 % (ref 4.6–6.5)

## 2018-09-10 DIAGNOSIS — E1129 Type 2 diabetes mellitus with other diabetic kidney complication: Secondary | ICD-10-CM | POA: Diagnosis not present

## 2018-09-10 DIAGNOSIS — N186 End stage renal disease: Secondary | ICD-10-CM | POA: Diagnosis not present

## 2018-09-10 DIAGNOSIS — D631 Anemia in chronic kidney disease: Secondary | ICD-10-CM | POA: Diagnosis not present

## 2018-09-10 DIAGNOSIS — D509 Iron deficiency anemia, unspecified: Secondary | ICD-10-CM | POA: Diagnosis not present

## 2018-09-10 DIAGNOSIS — N2581 Secondary hyperparathyroidism of renal origin: Secondary | ICD-10-CM | POA: Diagnosis not present

## 2018-09-10 LAB — FRUCTOSAMINE: Fructosamine: 295 umol/L — ABNORMAL HIGH (ref 0–285)

## 2018-09-11 ENCOUNTER — Ambulatory Visit (INDEPENDENT_AMBULATORY_CARE_PROVIDER_SITE_OTHER): Payer: Medicare Other | Admitting: Endocrinology

## 2018-09-11 ENCOUNTER — Encounter: Payer: Self-pay | Admitting: Endocrinology

## 2018-09-11 ENCOUNTER — Encounter: Payer: Medicare Other | Attending: Physician Assistant | Admitting: Physician Assistant

## 2018-09-11 VITALS — BP 112/72 | HR 92 | Ht 71.0 in | Wt 141.0 lb

## 2018-09-11 DIAGNOSIS — T8189XA Other complications of procedures, not elsewhere classified, initial encounter: Secondary | ICD-10-CM | POA: Diagnosis not present

## 2018-09-11 DIAGNOSIS — E039 Hypothyroidism, unspecified: Secondary | ICD-10-CM | POA: Diagnosis not present

## 2018-09-11 DIAGNOSIS — L98492 Non-pressure chronic ulcer of skin of other sites with fat layer exposed: Secondary | ICD-10-CM | POA: Diagnosis not present

## 2018-09-11 DIAGNOSIS — E11622 Type 2 diabetes mellitus with other skin ulcer: Secondary | ICD-10-CM | POA: Diagnosis not present

## 2018-09-11 DIAGNOSIS — E1122 Type 2 diabetes mellitus with diabetic chronic kidney disease: Secondary | ICD-10-CM | POA: Diagnosis not present

## 2018-09-11 DIAGNOSIS — Z992 Dependence on renal dialysis: Secondary | ICD-10-CM | POA: Insufficient documentation

## 2018-09-11 DIAGNOSIS — Z87891 Personal history of nicotine dependence: Secondary | ICD-10-CM | POA: Insufficient documentation

## 2018-09-11 DIAGNOSIS — E785 Hyperlipidemia, unspecified: Secondary | ICD-10-CM | POA: Insufficient documentation

## 2018-09-11 DIAGNOSIS — N2581 Secondary hyperparathyroidism of renal origin: Secondary | ICD-10-CM | POA: Diagnosis not present

## 2018-09-11 DIAGNOSIS — Z95820 Peripheral vascular angioplasty status with implants and grafts: Secondary | ICD-10-CM | POA: Diagnosis not present

## 2018-09-11 DIAGNOSIS — M109 Gout, unspecified: Secondary | ICD-10-CM | POA: Insufficient documentation

## 2018-09-11 DIAGNOSIS — N186 End stage renal disease: Secondary | ICD-10-CM | POA: Diagnosis not present

## 2018-09-11 DIAGNOSIS — Z880 Allergy status to penicillin: Secondary | ICD-10-CM | POA: Diagnosis not present

## 2018-09-11 DIAGNOSIS — I132 Hypertensive heart and chronic kidney disease with heart failure and with stage 5 chronic kidney disease, or end stage renal disease: Secondary | ICD-10-CM | POA: Diagnosis not present

## 2018-09-11 DIAGNOSIS — M8000XD Age-related osteoporosis with current pathological fracture, unspecified site, subsequent encounter for fracture with routine healing: Secondary | ICD-10-CM

## 2018-09-11 DIAGNOSIS — E1140 Type 2 diabetes mellitus with diabetic neuropathy, unspecified: Secondary | ICD-10-CM | POA: Insufficient documentation

## 2018-09-11 DIAGNOSIS — E1165 Type 2 diabetes mellitus with hyperglycemia: Secondary | ICD-10-CM

## 2018-09-11 MED ORDER — LINAGLIPTIN 5 MG PO TABS: 5.0000 mg | ORAL_TABLET | Freq: Every day | ORAL | 3 refills | Status: DC

## 2018-09-11 MED ORDER — DENOSUMAB 60 MG/ML ~~LOC~~ SOSY
60.0000 mg | PREFILLED_SYRINGE | Freq: Once | SUBCUTANEOUS | Status: AC
  Administered 2018-09-11: 60 mg via SUBCUTANEOUS

## 2018-09-11 NOTE — Progress Notes (Signed)
Patient ID: [name], male   DOB: 1940-12-10, 77 y.o.   MRN: [id number]           Reason for Appointment:  Follow-up for endocrinology problems   History of Present Illness:          Date of diagnosis of type 2 diabetes mellitus:  1996      Background history:  He has had long-standing diabetes probably treated with metformin initially and subsequently with sulfonylurea drugs At some point he was also given Actos in addition to his glipizide which he is still taking Records of his control are only available for the last 4 years Over the last few years his A1c has been generally in the 6-7% range His A1c was 6.2 in May 2016 and apparently was starting to get low normal blood sugars at times He was admitted to the hospital for fluid overload and renal failure and at that time his Actos was stopped Subsequently his blood sugars had been markedly increased He was switched from regular insulin to NovoLog on his initial consultation  Recent history:    Non-insulin hypoglycemic drugs: Victoza 0.6 mg daily  His A1c is 5.4 now   Current blood sugar patterns and problems identified:  He has been  on Victoza instead of Tradjenta 5 mg daily since his visit in 7/19 since he was having postprandial hyperglycemia previously  However he appears to have lost significant amount of weight since his last visit  He says that he does not get much of an appetite  Also he has had occasional low blood sugar episodes most notably when he did not eat all day because of a dental procedure  However he is usually not having symptoms with a relatively of low blood sugar and twice he had repeated the blood sugar and the second reading was not low  Lab glucose was 67 late morning without any breakfast  Does not appear to be having any significant postprandial hyperglycemia recently with only sporadic high readings at night after supper  He has had significant anemia and this may be affecting his A1c  results   Dialysis is done at 7 am, doing it on Monday/Wednesday/Fridays     Side effects from medications have been: None  Compliance with the medical regimen: Fair  Glucose monitoring:  done 0-1  times a day         Glucometer:  Freestyle  Blood Glucose readings by review of monitor: Download results as below   PRE-MEAL Fasting Lunch Dinner Bedtime Overall  Glucose range: ?  40-214   75-131  87-342   Mean/median:      128      Self-care: The diet that the patient has been following is: tries to limit sweets, portions. eating out only about once a week at a steak house        Typical meal intake: Breakfast is variable; supper 8 pm            Dietician visit, most recent: 08/2017               Exercise:  Not able to do any  Weight history:   Wt Readings from Last 3 Encounters:  09/11/18 141 lb (64 kg)  08/26/18 143 lb (64.9 kg)  08/26/18 142 lb (64.4 kg)    Glycemic control:    Lab Results  Component Value Date   HGBA1C 5.4 09/09/2018   HGBA1C 5.2 06/01/2018   HGBA1C 6.3 04/17/2018  Lab Results  Component Value Date   MICROALBUR 37.2 09/03/2016   LDLCALC 21 09/05/2017   CREATININE 6.11 (H) 08/26/2018    Lab Results  Component Value Date   FRUCTOSAMINE 295 (H) 09/09/2018   FRUCTOSAMINE 289 (H) 04/17/2018   FRUCTOSAMINE 272 02/20/2018    PROBLEM 2 Osteoporosis: See review of systems       Allergies as of 09/11/2018      Reactions   Penicillins Rash   Has patient had a PCN reaction causing immediate rash, facial/tongue/throat swelling, SOB or lightheadedness with hypotension: Yes Has patient had a PCN reaction causing severe rash involving mucus membranes or skin necrosis: Yes Has patient had a PCN reaction that required hospitalization: No Has patient had a PCN reaction occurring within the last 10 years: No If all of the above answers are "NO", then may proceed with Cephalosporin use.      Medication List       Accurate as of September 11, 2018 10:14 AM. Always use your most recent med list.        acetaminophen 325 MG tablet Commonly known as:  TYLENOL Take 1-2 tablets (325-650 mg total) by mouth every 4 (four) hours as needed for mild pain.   allopurinol 100 MG tablet Commonly known as:  ZYLOPRIM Take 100 mg by mouth daily as needed (pain).   Carboxymethylcellulose Sod PF 0.25 % Soln Place 1 drop into both eyes as needed (dry eyes).   cinacalcet 30 MG tablet Commonly known as:  SENSIPAR Take 1 tablet (30 mg total) by mouth daily with supper.   clobetasol 0.05 % external solution Commonly known as:  TEMOVATE Apply 1 application topically 2 (two) times daily as needed (irritation). Apply to scalp   denosumab 60 MG/ML Sosy injection Commonly known as:  PROLIA Inject 60 mg into the skin every 6 (six) months.   docusate sodium 100 MG capsule Commonly known as:  COLACE Take 1 capsule (100 mg total) by mouth daily.   feeding supplement (NEPRO CARB STEADY) Liqd Take 237 mLs by mouth daily.   Fluocinolone Acetonide Scalp 0.01 % Oil Apply 1 application topically at bedtime. Apply to scalp   fluticasone 0.05 % cream Commonly known as:  CUTIVATE Apply 1 application topically 2 (two) times daily as needed (irritation). Apply to face   fluticasone 50 MCG/ACT nasal spray Commonly known as:  FLONASE Place 1 spray into both nostrils daily.   FREESTYLE FREEDOM LITE w/Device Kit Use to check blood sugar 2 times per day dx code E11.65   freestyle lancets Use as instructed to check blood sugar 2 times per day dx code E11.65   glucose blood test strip Commonly known as:  FREESTYLE LITE Use as instructed to check blood sugar 3 times daily.   ketoconazole 2 % shampoo Commonly known as:  NIZORAL Apply 1 application topically 2 (two) times a week.   levothyroxine 50 MCG tablet Commonly known as:  SYNTHROID, LEVOTHROID Take 1 tablet (50 mcg total) by mouth daily.   liraglutide 18 MG/3ML Sopn Commonly known as:   VICTOZA Inject 0.1 mLs (0.6 mg total) into the skin daily.   memantine 10 MG tablet Commonly known as:  NAMENDA Take 1 tablet (10 mg total) by mouth 2 (two) times daily.   midodrine 10 MG tablet Commonly known as:  PROAMATINE Take one pill prior to hemodialysis on MWF   multivitamin with minerals tablet Take 1 tablet by mouth daily.   Naftifine HCl 2 % Crea Commonly  known as:  NAFTIN Apply to feet daily for fungal infection   omeprazole 40 MG capsule Commonly known as:  PRILOSEC Take 1 capsule (40 mg total) by mouth daily.   polyethylene glycol packet Commonly known as:  MIRALAX / GLYCOLAX Take 17 g by mouth daily.   senna 8.6 MG Tabs tablet Commonly known as:  SENOKOT Take 1 tablet (8.6 mg total) by mouth at bedtime.   sevelamer carbonate 800 MG tablet Commonly known as:  RENVELA Take 800 mg by mouth 3 (three) times daily before meals.   simvastatin 20 MG tablet Commonly known as:  ZOCOR Take 20 mg by mouth at bedtime.   vitamin B-12 1000 MCG tablet Commonly known as:  CYANOCOBALAMIN Take 1 tablet (1,000 mcg total) by mouth daily.       Allergies:  Allergies  Allergen Reactions  . Penicillins Rash    Has patient had a PCN reaction causing immediate rash, facial/tongue/throat swelling, SOB or lightheadedness with hypotension: Yes Has patient had a PCN reaction causing severe rash involving mucus membranes or skin necrosis: Yes Has patient had a PCN reaction that required hospitalization: No Has patient had a PCN reaction occurring within the last 10 years: No If all of the above answers are "NO", then may proceed with Cephalosporin use.     Past Medical History:  Diagnosis Date  . Allergy   . Anemia   . Arthritis   . Cataract    bil cateracts removed  . Coronary artery disease   . Dementia arising in the senium and presenium ([place])   . Diabetes mellitus    Type 2  . Diverticulitis   . ED (erectile dysfunction)   . Elevated homocysteine ([place])   .  ESRD (end stage renal disease) on dialysis ([place]) 03/2015  . GERD (gastroesophageal reflux disease)    pepto   . Gout   . Hyperlipidemia   . Hypertension   . Hypothyroidism   . Pneumonia   . PVD (peripheral vascular disease) ([place])    has plastic aorta  . Renal insufficiency   . Seasonal allergies   . Shortness of breath dyspnea   . Sleep apnea    does not wear c-pap  . Thyroid disease     Past Surgical History:  Procedure Laterality Date  . aortobifemoral bypass    . AV FISTULA PLACEMENT Left 12/01/2013   Procedure: ARTERIOVENOUS (AV) FISTULA CREATION- LEFT BRACHIOCEPHALIC;  Surgeon: Angelia Mould, MD;  Location: [place];  Service: Vascular;  Laterality: Left;  . [place] TRANSPOSITION Right 07/27/2014   Procedure: BASCILIC VEIN TRANSPOSITION;  Surgeon: Angelia Mould, MD;  Location: [place];  Service: Vascular;  Laterality: Right;  . BREAST SURGERY     left - granulomatous mastitis  . BURR HOLE Bilateral 07/22/2018   Procedure: BILATERAL BURR HOLES;  Surgeon: Kristeen Miss, MD;  Location: [place];  Service: Neurosurgery;  Laterality: Bilateral;  . COLONOSCOPY    . 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 ENTEROSCOPY N/A 06/03/2018   Procedure: ENTEROSCOPY;  Surgeon: Lavena Bullion, DO;  Location: [place];  Service: Gastroenterology;  Laterality: N/A;  . EYE SURGERY Bilateral    cataracts  . HEMODIALYSIS INPATIENT  01/17/2018      . HOT HEMOSTASIS N/A 06/03/2018   Procedure: HOT HEMOSTASIS (ARGON PLASMA COAGULATION/BICAP);  Surgeon: Lavena Bullion, DO;  Location: [facility] ENDOSCOPY;  Service: Gastroenterology;  Laterality: N/A;  . REVISON OF ARTERIOVENOUS FISTULA Left 02/09/2014  Procedure: REVISON OF LEFT ARTERIOVENOUS FISTULA - RESECTION OF RENDUNDANT VEIN;  Surgeon: Angelia Mould, MD;  Location: [place];  Service: Vascular;  Laterality: Left;  . SBO with lysis adhesions    . SHUNTOGRAM Left 04/19/2014   Procedure: FISTULOGRAM;  Surgeon:  Angelia Mould, MD;  Location[facility] C[facility] LAB;  Service: Cardiovascular;  Laterality: Left;  . UNILATERAL UPPER EXTREMEITY ANGIOGRAM N/A 07/12/2014   Procedure: UNILATERAL UPPER Anselmo Rod;  Surgeon: Angelia Mould, MD;  Location: [facility] CATH LAB;  Service: Cardiovascular;  Laterality: N/A;    Family History  Problem Relation Age of Onset  . Aneurysm Mother   . Heart disease Father   . Stroke Father   . Hypertension Father   . Diabetes Father   . Dementia Neg Hx   . Colon cancer Neg Hx   . Esophageal cancer Neg Hx   . Pancreatic cancer Neg Hx   . Prostate cancer Neg Hx   . Rectal cancer Neg Hx   . Stomach cancer Neg Hx     Social History:  reports that he quit smoking about 23 years ago. He has never used smokeless tobacco. He reports that he does not drink alcohol or use drugs.    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 he is not a candidate for Forteo and nephrologist suggested Prolia He has had his first injection of Prolia this year in 5/19 No recent back pain  However hospital discharge indicates he had rib fractures after his fall in October  Calcium corrected for his albumin is normal Is apparently taking any Sensipar currently  Lab Results  Component Value Date   PTH 154 (H) 10/06/2014   CALCIUM 9.5 09/09/2018   CAION 1.33 07/21/2018   PHOS 5.6 (H) 08/13/2018     Lipid history: He has been treated with Simvastatin 20 mg  by PCP, results as follows    Lab Results  Component Value Date   CHOL 79 09/05/2017   HDL 38.00 (L) 09/05/2017   LDLCALC 21 09/05/2017   TRIG 99.0 09/05/2017   CHOLHDL 2 09/05/2017          He has ESRD on dialysis    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  Mild hypothyroidism ?: On 50 mcg  levothyroxine with normal TSH levels  Lab Results  Component Value Date   TSH 0.77 08/26/2018       Physical Examination:  BP 112/72   Pulse 92   Ht _0  (1.803 m)   Wt 141 lb (64 kg)   BMI 19.67 kg/m       ASSESSMENT:  Diabetes type 2, recent BMI 22  See history of present illness for discussion of his current management, blood sugar patterns and problems identified  His blood sugars are generally well controlled again on low-dose Victoza A1c is 5.4 but likely lower than expected  For unknown reasons he appears to be losing weight and having decreased appetite now although unlikely to be from [place] alone He has had occasional low blood sugars although frequently without symptoms are not clear if this is from inaccurate meter, has follow-up repeat readings that are normal on the same day However lab glucose was only 67 Currently has decreased appetite  OSTEOPOROSIS with recurrent fractures: Continue Prolia and he will get another injection today   PLAN:   Trial of Tradjenta again instead of Victoza Hopefully this will reduce his tendency to lose weight and occasional hypoglycemia May consider giving him a new meter if his blood sugars still show discrepancies on the next visit  There are no Patient Instructions on file for this visit.        Elayne Snare 09/11/2018, 10:14 AM   Note: This office note was prepared with Dragon voice recognition system technology. Any transcriptional errors that result from this process are unintentional.

## 2018-09-11 NOTE — Patient Instructions (Signed)
Stop Victoza Restart Tradgenta  Check blood sugars on waking up 3 days a week  Also check blood sugars about 2 hours after meals and do this after different meals by rotation  Recommended blood sugar levels on waking up are 90-130 and about 2 hours after meal is 130-160  Please bring your blood sugar monitor to each visit, thank you

## 2018-09-12 DIAGNOSIS — D509 Iron deficiency anemia, unspecified: Secondary | ICD-10-CM | POA: Diagnosis not present

## 2018-09-12 DIAGNOSIS — N2581 Secondary hyperparathyroidism of renal origin: Secondary | ICD-10-CM | POA: Diagnosis not present

## 2018-09-12 DIAGNOSIS — E1129 Type 2 diabetes mellitus with other diabetic kidney complication: Secondary | ICD-10-CM | POA: Diagnosis not present

## 2018-09-12 DIAGNOSIS — D631 Anemia in chronic kidney disease: Secondary | ICD-10-CM | POA: Diagnosis not present

## 2018-09-12 DIAGNOSIS — N186 End stage renal disease: Secondary | ICD-10-CM | POA: Diagnosis not present

## 2018-09-12 NOTE — Progress Notes (Signed)
[name], [name] ([id number]) Visit Report for 09/11/2018 Abuse/Suicide Risk Screen Details Patient Name: [name], [name]. Date of Service: 09/11/2018 1:00 PM Medical Record Number: [id number] Patient Account Number: [id number] Date of Birth/Sex: 1940-12-30 (77 y.o. M) Treating RN: Secundino Ginger Primary Care Cabela Pacifico: Tedra [place]egal Other Clinician: Referring Jordie Skalsky: Referral, Self Treating Karrah Mangini/Extender: STONE III, HOYT Weeks in Treatment: 0 Abuse/Suicide Risk Screen Items Answer ABUSE/SUICIDE RISK SCREEN: Has anyone close to you tried to hurt or harm you recentlyo No Do you feel uncomfortable with anyone in your familyo No Has anyone forced you do things that you didnot want to doo No Do you have any thoughts of harming yourselfo No Patient displays signs or symptoms of abuse and/or neglect. No Electronic Signature(s) Signed: 09/11/2018 5:12:49 PM By: Secundino Ginger Entered By: Secundino Ginger on 09/11/2018 13:28:04 [name], [name] ([id number]) -------------------------------------------------------------------------------- Activities of Daily Living Details Patient Name: [name]. Date of Service: 09/11/2018 1:00 PM Medical Record Number: [id number] Patient Account Number: [id number] Date of Birth/Sex: [birth year] (77 y.o. M) Treating RN: Secundino Ginger Primary Care Mysty Kielty: Tedra [place] Other Clinician: Referring Zoei Amison: Referral, Self Treating Antion Andres/Extender: [name], HOYT Weeks in Treatment: 0 Activities of Daily Living Items Answer Activities of Daily Living (Please select one for each item) Drive Automobile Completely Able Take Medications Completely Able Use Telephone Completely Able Care for Appearance Completely Able Use Toilet Completely Able Bath / Shower Completely Able Dress Self Completely Able Feed Self Completely Able Walk Completely Able Get In / Out Bed Completely Able Housework Completely Able Prepare Meals Completely [place] for Self Completely Able Electronic Signature(s) Signed: 09/11/2018 5:12:49 PM By: Secundino Ginger Entered By: Secundino Ginger on 09/11/2018 13:28:38 [name], [name] ([id number]) -------------------------------------------------------------------------------- Education Assessment Details Patient Name: [name]. Date of Service: 09/11/2018 1:00 PM Medical Record Number: [id number] Patient Account Number: [id number] Date of Birth/Sex: [birth year] (77 y.o. M) Treating RN: Secundino Ginger Primary Care Vyncent Overby: Tedra [place] Other Clinician: Referring Jahson Emanuele: Referral, Self Treating Dshaun Reppucci/Extender: [name], HOYT Weeks in Treatment: 0 Learning Preferences/Education Level/Primary Language Learning Preference: Explanation, Demonstration Highest Education Level: College or Above Preferred Language: English Cognitive Barrier Assessment/Beliefs Language Barrier: No Translator Needed: No Memory Deficit: No Emotional Barrier: No Cultural/Religious Beliefs Affecting Medical Care: No Physical Barrier Assessment Impaired Vision: No Impaired Hearing: No Decreased Hand dexterity: No Knowledge/Comprehension Assessment Knowledge Level: High Comprehension Level: High Ability to understand written High instructions: Ability to understand verbal High instructions: Motivation Assessment Anxiety Level: Calm Cooperation: Cooperative Education Importance: Acknowledges Need Interest in Health Problems: Asks Questions Perception: Coherent Willingness to Engage in Self- High Management Activities: Readiness to Engage in Self- High Management Activities: Electronic Signature(s) Signed: 09/11/2018 5:12:49 PM By: Secundino Ginger Entered By: Secundino Ginger on 09/11/2018 13:29:19 [name], [name] ([id number]) -------------------------------------------------------------------------------- Fall Risk Assessment Details Patient Name: [name]. Date of Service: 09/11/2018 1:00 PM Medical Record  Number: [id number] Patient Account Number: [id number] Date of Birth/Sex: 1941-06-08 (77 y.o. M) Treating RN: Secundino Ginger Primary Care Quenna Doepke: Tedra [place] Other Clinician: Referring Shatori Bertucci: Referral, Self Treating Ahmar Pickrell/Extender: [name], HOYT Weeks in Treatment: 0 Fall Risk Assessment Items Have you had 2 or more falls in the last 12 monthso 0 No Have you had any fall that resulted in injury in the last 12 monthso 0 Yes FALL RISK ASSESSMENT: History of falling - immediate or within 3 months 0 No Secondary diagnosis 0 No Ambulatory aid None/bed rest/wheelchair/nurse 0 No Crutches/cane/walker 0 No Furniture 0 No IV Access/Saline  Lock 0 No Gait/Training Normal/bed rest/immobile 0 No Weak 10 Yes Impaired 0 No Mental Status Oriented to own ability 0 Yes Electronic Signature(s) Signed: 09/11/2018 5:12:49 PM By: Secundino Ginger Entered By: Secundino Ginger on 09/11/2018 13:30:17 [name], [name] ([id number]) -------------------------------------------------------------------------------- Foot Assessment Details Patient Name: [name]. Date of Service: 09/11/2018 1:00 PM Medical Record Number: [id number] Patient Account Number: [id number] Date of Birth/Sex: 1941-09-12 (77 y.o. M) Treating RN: Secundino Ginger Primary Care Ronelle Smallman: Tedra [place] Other Clinician: Referring Vendela Troung: Referral, Self Treating Danetta Prom/Extender: [name], HOYT Weeks in Treatment: 0 Foot Assessment Items Site Locations + = Sensation present, - = Sensation absent, C = Callus, U = Ulcer R = Redness, W = Warmth, M = Maceration, PU = Pre-ulcerative lesion F = Fissure, S = Swelling, D = Dryness Assessment Right: Left: Other Deformity: No No Prior Foot Ulcer: No No Prior Amputation: No No Charcot Joint: No No Ambulatory Status: Gait: Electronic Signature(s) Signed: 09/11/2018 5:12:49 PM By: Secundino Ginger Entered By: Secundino Ginger on 09/11/2018 13:30:40 [name], [name]  ([id number]) -------------------------------------------------------------------------------- Nutrition Risk Assessment Details Patient Name: [name]. Date of Service: 09/11/2018 1:00 PM Medical Record Number: [id number] Patient Account Number: [id number] Date of Birth/Sex: March 27, 1941 (77 y.o. M) Treating RN: Secundino Ginger Primary Care Kjersti Dittmer: Tedra [place] Other Clinician: Referring Noela Brothers: Referral, Self Treating Gabriel Conry/Extender: STONE III, HOYT Weeks in Treatment: 0 Height (in): 71 Weight (lbs): 146 Body Mass Index (BMI): 20.4 Nutrition Risk Assessment Items NUTRITION RISK SCREEN: I have an illness or condition that made me change the kind and/or amount of 0 No food I eat I eat fewer than two meals per day 0 No I eat few fruits and vegetables, or milk products 0 No I have three or more drinks of beer, liquor or wine almost every day 0 No I have tooth or mouth problems that make it hard for me to eat 0 No I don't always have enough money to buy the food I need 0 No I eat alone most of the time 0 No I take three or more different prescribed or over-the-counter drugs a day 0 No Without wanting to, I have lost or gained 10 pounds in the last six months 0 No I am not always physically able to shop, cook and/or feed myself 0 No Nutrition Protocols Good Risk Protocol Moderate Risk Protocol Electronic Signature(s) Signed: 09/11/2018 5:12:49 PM By: Secundino Ginger Entered BySecundino Ginger on 09/11/2018 13:30:28

## 2018-09-14 DIAGNOSIS — E1129 Type 2 diabetes mellitus with other diabetic kidney complication: Secondary | ICD-10-CM | POA: Diagnosis not present

## 2018-09-14 DIAGNOSIS — N186 End stage renal disease: Secondary | ICD-10-CM | POA: Diagnosis not present

## 2018-09-14 DIAGNOSIS — D631 Anemia in chronic kidney disease: Secondary | ICD-10-CM | POA: Diagnosis not present

## 2018-09-14 DIAGNOSIS — D509 Iron deficiency anemia, unspecified: Secondary | ICD-10-CM | POA: Diagnosis not present

## 2018-09-14 DIAGNOSIS — N2581 Secondary hyperparathyroidism of renal origin: Secondary | ICD-10-CM | POA: Diagnosis not present

## 2018-09-14 NOTE — Progress Notes (Signed)
[name] ([id number]) Visit Report for 09/11/2018 Chief Complaint Document Details Patient Name: [name]. Date of Service: 09/11/2018 1:00 PM Medical Record Number: [id number] Patient Account Number: [id number] Date of Birth/Sex: April 24, 1941 (77 y.o. M) Treating RN: Cornell Barman Primary Care Provider: Tedra Senegal Other Clinician: Referring Provider: Referral, Self Treating Provider/Extender: [name]: 0 Information Obtained from: Patient Chief Complaint Abdominal ulcers Electronic Signature(s) Signed: 09/11/2018 5:48:37 PM By: Worthy Keeler PA-C Entered By: Worthy Keeler on 09/11/2018 14:12:43 [name] ([id number]) -------------------------------------------------------------------------------- Debridement Details Patient Name: [name]. Date of Service: 09/11/2018 1:00 PM Medical Record Number: [id number] Patient Account Number: [id number] Date of Birth/Sex: 04/18/1941 (77 y.o. M) Treating RN: Montey Hora Primary Care Provider: Tedra Senegal Other Clinician: Referring Provider: Referral, Self Treating Provider/Extender: Johnny Hake, Jimena Wieczorek Weeks in [name]: 0 Debridement Performed for Wound #6 Right Abdomen - Lower Quadrant Assessment: Performed By: Physician STONE III, Arieliz Latino E., PA-C Debridement Type: Debridement Level of Consciousness (Pre- Awake and Alert procedure): Pre-procedure Verification/Time Yes - 13:56 Out Taken: Start Time: 13:56 Pain Control: Lidocaine 4% Topical Solution Total Area Debrided (L x W): 6.5 (cm) x 8.5 (cm) = 55.25 (cm) Tissue and other material Viable, Non-Viable, Eschar, Slough, Subcutaneous, Slough debrided: Level: Skin/Subcutaneous Tissue Debridement Description: Excisional Instrument: Forceps, Scissors Bleeding: Minimum Hemostasis Achieved: Pressure End Time: 14:02 Procedural Pain: 0 Post Procedural Pain: 0 Response to [name]: Procedure was tolerated well Level of  Consciousness Awake and Alert (Post-procedure): Post Debridement Measurements of Total Wound Length: (cm) 6.5 Width: (cm) 8.5 Depth: (cm) 0.2 Volume: (cm) 8.679 Character of Wound/Ulcer Post Debridement: Improved Post Procedure Diagnosis Same as Pre-procedure Electronic Signature(s) Signed: 09/11/2018 5:48:37 PM By: Worthy Keeler PA-C Signed: 09/12/2018 5:12:41 PM By: Montey Hora Entered By: Montey Hora on 09/11/2018 14:03:04 [name], [name] ([id number]) -------------------------------------------------------------------------------- HPI Details Patient Name: [name]. Date of Service: 09/11/2018 1:00 PM Medical Record Number: [id number] Patient Account Number: [id number] Date of Birth/Sex: 10-11-1940 (77 y.o. M) Treating RN: Cornell Barman Primary Care Provider: Tedra Senegal Other Clinician: Referring Provider: Referral, Self Treating Provider/Extender: Johnny Hake, Nazir Hacker Weeks in [name]: 0 History of Present Illness HPI Description: 77 year old gentleman seen earlier this year in January for a abdominal wound is now back with the same problem which has recurred for about 2 weeks and he has been trying to apply some local antibiotic ointment and a Band-Aid there. Of note he has significant pressure in this area due to his beltline and may have put on some weight which results in his trousers being very tight around the waist. The else has changed in his HandP and his diabetes is pretty well controlled. most recent hemoglobin A1c on 12/06/2016 was 7.8%. he was reviewed by his endocrinologist Dr. Dwyane Dee who made appropriate modifications to his [name] plan. he was recently seen by neurology for mild cognitive impairment that was found to be normal for his age 70/21/18 patient appears to be doing well in regard to his abdominal wound area on evaluation today. He has been tolerating the dressing changes with the [facility] Dressing I'm pleased with how this has  progressed and in fact the wound appears to be completely healed on evaluation today. There is definitely no evidence of infection. 03/29/17 Patient's wound on the abdominal region appears to be completely healed on evaluation today. This wound has been doing very well and I think the biggest thing is going to be keeping it from reactivation where it rubbed on  his pants. 04/18/2017 -- he was recently discharged about 3 weeks ago and was using a binder which probably caused him abrasion on the area of the recently healed scar. 05/02/17 on evaluation today patient appears to be doing fairly well in regard to his abdominal wound at the site of the surgical scar. Unfortunately the scar tissue is very fragile and tends to reopen. He did get a small abdominal binder to try to pad the area in order to offload and prevent his pants from rubbing specifically at the site. With that being said he tells me that Dr. Con Memos was concerned that the binder might actually have rub the area causing a reopening. I did have a look at this today and unfortunately it appears that he was putting this over his pants which was making matters worse. 05/09/17 on evaluation today patient continues to do well with the [facility] Dressing. There is a slight skin tear inferior to the wound that we have been treating that may have been due to the dressing sticking slightly. This is minimal however. I did discuss with patient prior to removal of the dressing for dressing changes that they whet this in order to loosen it and not risk damaging in the field. Nonetheless it appears that they have been doing very well with the dressing changes he had his wife took over the dressing changes as she states that he was doing it wrong. Overall though I'm pleased with how things are progressing. No fevers, chills, nausea, or vomiting noted at this time. Readmission: 08/27/17 on evaluation today patient appears to be doing similarly to how he  was doing prior to healing previously August 2018 when we previously discharged him. He has fortunately been doing well up until just a couple of weeks ago and he had some of the [facility] Dressing over so he began to treat this on his own. He states that this has gotten much smaller but he ran out of supplies and therefore made an appointment to come back in for evaluation. No fevers, chills, nausea, or vomiting noted at this time. He has no discomfort at this point in regard to the wound. He does tell me that he has one maybe two compression fractures in his lower back and that he is going to be seen by the physician at [facility] imaging, Dr. Estanislado Pandy. ====== Old notes The 77 year old gentleman who has a past medical history of diabetes mellitus, end-stage renal disease on dialysis, hypertension, secondary hyperparathyroidism, peripheral vascular disease and history of previous arterial bifemoral bypass graft in 1992 also has a history of gout, hyperlipidemia, hypothyroidism. last hemoglobin A1c was 7.5%. status post AV fistula [name], [name]. ([id number]) placement, breast surgery for left granulomatous mastitis, triple a repair in 1992, small bowel obstruction, upper arm angiograms for AV fistula on the left side. was noted to have a superficial skin wound in the periumbilical area since late December 2017. He was asked to clean it with hydrogen peroxide and apply Bactroban. He was seen by vascular surgery in October 2017 where his right ABI was 0.56 left ABI was 0.87 and right TBI was 0.69 and left TBI was 0.70 ===== 09/25/16; patient was previously a patient of Dr. Ardeen Garland who has recurrent skin breakdown in the area of the large incision that was initially 4 apparently an open AAA aneurysm repair. He had secondary surgery but I'm not sure of what etiology this was. He is been using Hydrofera Blue to the open wounds in  the mid abdominal surgical incision. This is now closed over. I  think he also uses this area as his waist for his pants and I've asked him to adjust this Readmission: 02/11/18 on evaluation today patient presents for evaluation concerning a sacral pressure ulcer which has been present for several weeks. He states that he actually had several areas that were open there appears just be one remaining. He has been tolerating the dressing changes is wise been using Hydrofera Blue Dressing as left over from his abdominal ulcer that we have previously treated. Fortunately there does not appear to be any evidence of infection which is good news. With that being said he states that he's been having issues at one point even at dialysis he had to discontinue dialysis and go home due to the pain. He has not been using any cushion to sit on although he states he does have one that he can use. No fevers, chills, nausea, or vomiting noted at this time. He had a significant issue with diarrhea prior to this occurring and I believe this may have had some contributing factor as well. 02/18/18 on evaluation today patient's wound actually appears to be doing about the same. Unfortunately he really has not noted any significant improvement over last week he still is very tender even just to light touch unfortunately over the periwound region. There does not appear to be any evidence of infection which is good news. He is especially tender at the 12 o'clock location even a full fingers with away from the actual wound site. There still slough covering the surface of the wound. 03/04/18 on evaluation today patient's wound actually appears to show signs of great improvement compared to last week's evaluation. He is definitely showing improvement week by week which I think is great news. Obviously that is what we are looking for. Nonetheless I did not even need to perform any sharp debridement today being that there was no slough noted on the surface of the wound. His blood pressure  continues to remain low although this is something that is normal. That is for him. 03/18/18 on evaluation today patient's wound actually appears to potentially be completely healed although I cannot tell for sure. He has a small indeed very small eschar noted over the wound surface which also was measuring smaller than what it was previous. With that being said he has discomfort to the point that I'm not really able to do much with this in order to remove the eschar and prove that everything is completely healed underneath. Nonetheless it's starting to look like this may lift up on its own and I think that may be the best course for him currently. 04/01/18 on evaluation today patient appears to be doing very well in fact his wound is completely healed today. He's been tolerating the dressing changes without complication. With that being said I do believe at this point that he is ready for discharge she still having some discomfort deeper and he tells me this may just be some irritation from the long periods of time he has to sit for dialysis nonetheless he's not having the redness that was previously noted I think he has done very well trying to keep pressure off of this area. Readmission: 09/11/18 on evaluation today patient presents for reevaluation here in our clinic due to a remission with ulcers of the abdominal area. This is a region that he has previously been treated for bar clinic as well we were able  to get this healed. 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 He states he was in the hospital when this just suddenly came up toward the end of his hospital stay. There really is nothing else currently going on indicating more significant issue at this point. Fortunately No fevers, chills, nausea, or vomiting noted at this time. The patient does have diabetes, is on dialysis, and states that the area does tend to hurt but  only near the bottom of the wound though this is rather painful. Electronic Signature(s) [name], [name] ([id number]) Signed: 09/11/2018 5:48:37 PM By: Worthy Keeler PA-C Entered By: Worthy Keeler on 09/11/2018 14:14:22 [name], [name] ([id number]) -------------------------------------------------------------------------------- Physical Exam Details Patient Name: [name]. Date of Service: 09/11/2018 1:00 PM Medical Record Number: [id number] Patient Account Number: [id number] Date of Birth/Sex: 1941/03/15 (77 y.o. M) Treating RN: Cornell Barman Primary Care Provider: Tedra Senegal Other Clinician: Referring Provider: Referral, Self Treating Provider/Extender: STONE III, Paz Winsett Weeks in [name]: 0 Constitutional patient is hypertensive.. pulse regular and within target range for patient.Marland Kitchen respirations regular, non-labored and within target range for patient.Marland Kitchen temperature within target range for patient.. Well-nourished and well-hydrated in no acute distress. Eyes conjunctiva clear no eyelid edema noted. pupils equal round and reactive to light and accommodation. Ears, Nose, Mouth, and Throat no gross abnormality of ear auricles or external auditory canals. normal hearing noted during conversation. mucus membranes moist. Respiratory normal breathing without difficulty. clear to auscultation bilaterally. Cardiovascular regular rate and rhythm with normal S1, S2. no clubbing, cyanosis, significant edema, <3 sec cap refill. Gastrointestinal (GI) soft, non-tender, non-distended, +BS. no ventral hernia noted. Musculoskeletal unsteady while walking. no significant deformity or arthritic changes, no loss or range of motion, no clubbing. Psychiatric this patient is able to make decisions and demonstrates good insight into disease process. Alert and Oriented x 3. pleasant and cooperative. Notes Patient's wound currently looks like a large eschar both locations that is highly  reminiscent of Calciphylaxis. I think this may be the most likely scenario in the setting of the patient's past medical history. With that being said I did perform a light debridement today of the wound bed in order to clear away the eschar and then we gonna subsequently utilize Santyl to try to help with getting this area to clean up in preparation for healing. Electronic Signature(s) Signed: 09/11/2018 5:48:37 PM By: Worthy Keeler PA-C Entered By: Worthy Keeler on 09/11/2018 14:15:10 [name], [name] ([id number]) -------------------------------------------------------------------------------- Physician Orders Details Patient Name: [name]. Date of Service: 09/11/2018 1:00 PM Medical Record Number: [id number] Patient Account Number: [id number] Date of Birth/Sex: 1940-10-04 (77 y.o. M) Treating RN: Montey Hora Primary Care Provider: Tedra Senegal Other Clinician: Referring Provider: Referral, Self Treating Provider/Extender: STONE III, Darrelle [name] Weeks in [name]: 0 Verbal / Phone Orders: No Diagnosis Coding Wound Cleansing Wound #6 Right Abdomen - Lower Quadrant o Clean wound with Normal Saline. Anesthetic (add to Medication List) Wound #6 Right Abdomen - Lower Quadrant o Topical Lidocaine 4% cream applied to wound bed prior to debridement (In Clinic Only). Primary Wound Dressing Wound #6 Right Abdomen - Lower Quadrant o Santyl Ointment - Please start using santyl once patient has it o Iodoflex - Please use iodoflex until santyl is available Secondary Dressing Wound #6 Right Abdomen - Lower Quadrant o Dry Gauze o XtraSorb Dressing Change Frequency Wound #6 Right Abdomen - Lower Quadrant o Change dressing every day. Follow-up Appointments  Wound #6 Right Abdomen - Lower Quadrant o Return Appointment in 1 week. Home Health Wound #6 Right Abdomen - [place] Visits - [facility] please instruct family member in wound care daily and  provide wound care supplies for daily dressing changes o Home Health Nurse may visit PRN to address patientos wound care needs. o FACE TO FACE ENCOUNTER: MEDICARE and MEDICAID PATIENTS: I certify that this patient is under my care and that I had a face-to-face encounter that meets the physician face-to-face encounter requirements with this patient on this date. The encounter with the patient was in whole or in part for the following MEDICAL CONDITION: (primary reason for [place]) MEDICAL NECESSITY: I certify, that based on my findings, NURSING services are a medically necessary home health service. HOME BOUND STATUS: I certify that my clinical findings support that this patient is homebound (i.e., Due to illness or injury, pt requires aid of supportive devices such as crutches, cane, wheelchairs, walkers, the use of special transportation or the assistance of another person to leave their place of residence. There is a normal inability to leave the home and doing so requires considerable and taxing effort. Other absences are for medical reasons / religious services and are infrequent or of short duration when for other reasons). [place], ORLYN ODONOGHUE ([id number]) o If current dressing causes regression in wound condition, may D/C ordered dressing product/s and apply Normal Saline Moist Dressing daily until next [place] / Other MD appointment. [place] of regression in wound condition at [phone]. o Please direct any NON-WOUND related issues/requests for orders to patient's Primary Care Physician Patient Medications Allergies: penicillin Notifications Medication Indication Start End Santyl 09/11/2018 DOSE 0 - topical 250 unit/gram ointment - ointment topical applied nickel thick to the wound bed and then cover with a dressing as directed Electronic Signature(s) Signed: 09/11/2018 2:16:15 PM By: Worthy Keeler PA-C Entered By: Worthy Keeler on  09/11/2018 14:16:14 [place], [name] ([id number]) -------------------------------------------------------------------------------- Prescription 09/11/2018 Patient Name: [name]. Rx Reference #: Date of Birth: [birth year] Provider: Worthy Keeler PA-C Sex: Jerilynn Mages NPI#: [id number] Phone #: [phone] DEA#: [id number] License #: Patient Address: [phone] [place], [place] [address] [facility] [address] [place], [place] [address] [phone] Allergies penicillin Reaction: hives Severity: Mild Medication Medication: Route: Strength: Form: Santyl topical 250 unit/gram ointment Class: TOPICAL/MUCOUS MEMBR./SUBCUT. ENZYMES Dose: Frequency / Time: Indication: 0 ointment topical applied nickel thick to the wound bed and then cover with a dressing as directed Number of Refills: Number of Units: 0 One Hundred Eighty (180) Gram(s) Generic Substitution: Start Date: End Date: Administered at Substitution Permitted 70/96/2836 Facility: No Note to Pharmacy: Manufacturer's Note: The total quantity has been calculated in grams due to Santyl being available in either 30 or 90 gram tubes. Signature(s): Date(s): MEGHAN, TIEMANN ([id number]) Electronic Signature(s) Signed: 09/11/2018 5:48:37 PM By: Worthy Keeler PA-C Entered By: Worthy Keeler on 09/11/2018 14:16:30 [name], [name] ([id number]) --------------------------------------------------------------------------------  Problem List Details Patient Name: [name]. Date of Service: 09/11/2018 1:00 PM Medical Record Number: [id number] Patient Account Number: [id number] Date of Birth/Sex: 1941-01-16 (77 y.o. 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 Date of Service: 09/11/2018 1:00 PM Medical Record Number: [id number] Patient Account Number: [id number] Date of Birth/Sex: 22-Dec-1940 (77 y.o. M) Treating RN: Cornell Barman Primary Care Provider: Tedra Senegal Other Clinician: Referring Provider: Referral, Self Treating Provider/Extender: Johnny Hake, Evangelina Delancey Weeks in [name]: 0 Subjective Chief Complaint Information obtained from Patient Abdominal ulcers History of Present Illness (HPI) 77 year old gentleman seen earlier this year in January for a abdominal wound is now back with the same problem which has recurred for about 2 weeks and he has been trying to apply some local antibiotic ointment and a Band-Aid there. Of note he has significant pressure in this area due to his beltline and may have put on some weight which results in his trousers being very tight around the waist. The else has changed in his HandP and his diabetes is pretty well controlled. most recent hemoglobin A1c on 12/06/2016 was 7.8%. he was reviewed by his endocrinologist Dr. Dwyane Dee who made appropriate modifications to his [name] plan. he was recently seen by neurology for mild cognitive impairment that was found to be normal for his age 59/21/18 patient appears to be doing well in regard to his  abdominal wound area on evaluation today. He has been tolerating the dressing changes with the [facility] Dressing I'm pleased with how this has progressed and in fact the wound appears to be completely healed on evaluation today. There is definitely no evidence of infection. 03/29/17 Patient's wound on the abdominal region appears to be completely healed on evaluation today. This wound has been doing very well and I think the biggest thing is going to be keeping it from reactivation where it rubbed on his pants. 04/18/2017 -- he was recently discharged about 3 weeks ago and was using a binder which probably caused him abrasion on the area of the recently healed scar. 05/02/17 on evaluation today patient appears to be doing fairly well in regard to his abdominal wound at the site of the surgical scar. Unfortunately the scar tissue is very fragile and tends to reopen. He did get a small abdominal binder to try to pad the area in order to offload and prevent his pants from rubbing specifically at the site. With that being said he tells me that Dr. Con Memos was concerned that the binder might actually have rub the area causing a reopening. I did have a look at this today and unfortunately it appears that he was putting this over his pants which was making matters worse. 05/09/17 on evaluation today patient continues to do well with the [facility] Dressing. There is a slight skin tear inferior to the wound that we have been treating that may have been due to the dressing sticking slightly. This is minimal however. I did discuss with patient prior to removal of the dressing for dressing changes that they whet this in order to loosen it and not risk damaging in the field. Nonetheless it appears that they have been doing very well with the dressing changes he had his wife took over the dressing changes as she states that he was  doing it wrong. Overall though I'm pleased with how things are progressing. No  fevers, chills, nausea, or vomiting noted at this time. Readmission: 08/27/17 on evaluation today patient appears to be doing similarly to how he was doing prior to healing previously August 2018 when we previously discharged him. He has fortunately been doing well up until just a couple of weeks ago and he had some of the [facility] Dressing over so he began to treat this on his own. He states that this has gotten much smaller but he ran out of supplies and therefore made an appointment to come back in for evaluation. No fevers, chills, nausea, or vomiting noted at this time. He has no discomfort at this point in regard to the wound. He does tell me that he has one maybe two compression fractures in his lower back and that he is going to be seen by the physician at [facility] imaging, Dr. Estanislado Pandy. [name], [name] ([id number]) ====== Old notes The 77 year old gentleman who has a past medical history of diabetes mellitus, end-stage renal disease on dialysis, hypertension, secondary hyperparathyroidism, peripheral vascular disease and history of previous arterial bifemoral bypass graft in 1992 also has a history of gout, hyperlipidemia, hypothyroidism. last hemoglobin A1c was 7.5%. status post AV fistula placement, breast surgery for left granulomatous mastitis, triple a repair in 1992, small bowel obstruction, upper arm angiograms for AV fistula on the left side. was noted to have a superficial skin wound in the periumbilical area since late December 2017. He was asked to clean it with hydrogen peroxide and apply Bactroban. He was seen by vascular surgery in October 2017 where his right ABI was 0.56 left ABI was 0.87 and right TBI was 0.69 and left TBI was 0.70 ===== 09/25/16; patient was previously a patient of Dr. Ardeen Garland who has recurrent skin breakdown in the area of the large incision that was initially 4 apparently an open AAA aneurysm repair. He had secondary surgery but I'm not sure  of what etiology this was. He is been using Hydrofera Blue to the open wounds in the mid abdominal surgical incision. This is now closed over. I think he also uses this area as his waist for his pants and I've asked him to adjust this Readmission: 02/11/18 on evaluation today patient presents for evaluation concerning a sacral pressure ulcer which has been present for several weeks. He states that he actually had several areas that were open there appears just be one remaining. He has been tolerating the dressing changes is wise been using Hydrofera Blue Dressing as left over from his abdominal ulcer that we have previously treated. Fortunately there does not appear to be any evidence of infection which is good news. With that being said he states that he's been having issues at one point even at dialysis he had to discontinue dialysis and go home due to the pain. He has not been using any cushion to sit on although he states he does have one that he can use. No fevers, chills, nausea, or vomiting noted at this time. He had a significant issue with diarrhea prior to this occurring and I believe this may have had some contributing factor as well. 02/18/18 on evaluation today patient's wound actually appears to be doing about the same. Unfortunately he really has not noted any significant improvement over last week he still is very tender even just to light touch unfortunately over the periwound region. There does not appear to be any evidence of  infection which is good news. He is especially tender at the 12 o'clock location even a full fingers with away from the actual wound site. There still slough covering the surface of the wound. 03/04/18 on evaluation today patient's wound actually appears to show signs of great improvement compared to last week's evaluation. He is definitely showing improvement week by week which I think is great news. Obviously that is what we are looking for. Nonetheless I  did not even need to perform any sharp debridement today being that there was no slough noted on the surface of the wound. His blood pressure continues to remain low although this is something that is normal. That is for him. 03/18/18 on evaluation today patient's wound actually appears to potentially be completely healed although I cannot tell for sure. He has a small indeed very small eschar noted over the wound surface which also was measuring smaller than what it was previous. With that being said he has discomfort to the point that I'm not really able to do much with this in order to remove the eschar and prove that everything is completely healed underneath. Nonetheless it's starting to look like this may lift up on its own and I think that may be the best course for him currently. 04/01/18 on evaluation today patient appears to be doing very well in fact his wound is completely healed today. He's been tolerating the dressing changes without complication. With that being said I do believe at this point that he is ready for discharge she still having some discomfort deeper and he tells me this may just be some irritation from the long periods of time he has to sit for dialysis nonetheless he's not having the redness that was previously noted I think he has done very well trying to keep pressure off of this area. Readmission: 09/11/18 on evaluation today patient presents for reevaluation here in our clinic due to a remission with ulcers of the abdominal area. This is a region that he has previously been treated for bar clinic as well we were able to get this healed. 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 He states he was in the hospital when this just suddenly came up toward the end of his hospital stay. There really is nothing else currently going on indicating more significant issue at this point. Fortunately No fevers,  chills, nausea, or vomiting noted at this time. Hottle, [name] ([id number]) The patient does have diabetes, is on dialysis, and states that the area does tend to hurt but only near the bottom of the wound though this is rather painful. Wound History Patient presents with 2 open wounds that have been present for approximately 2 weeks. Patient has been treating wounds in the following manner: zinc ,drsg. Laboratory tests have been performed in the last month. Patient reportedly has not tested positive for an antibiotic resistant organism. Patient reportedly has not tested positive for osteomyelitis. Patient reportedly has not had testing performed to evaluate circulation in the legs. Patient History Information obtained from Patient. Allergies penicillin (Severity: Mild, Reaction: hives) Family History Diabetes - Father,Paternal Grandparents, Heart Disease - Father, Hypertension - Father, No family history of Cancer, Hereditary Spherocytosis, Kidney Disease, Lung Disease, Seizures, Stroke, Thyroid Problems, Tuberculosis. Social History Former smoker - quit in 1996, Marital Status - Married, Alcohol Use - Never - quit in 1976, Drug Use - No History, Caffeine Use - Never. Medical History  Endocrine Patient has history of Type II Diabetes Denies history of Type I Diabetes Patient is treated with Oral Agents. 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 Ear/Nose/Mouth/Throat Denies complaints or symptoms of Difficult clearing ears, Sinusitis. Hematologic/Lymphatic Denies complaints or symptoms of Bleeding / Clotting Disorders, Human Immunodeficiency Virus. Cardiovascular Denies complaints or symptoms of Chest pain, LE edema. Gastrointestinal Denies complaints or symptoms of Frequent  diarrhea, Nausea, Vomiting. Endocrine Denies complaints or symptoms of Hepatitis, Thyroid disease, Polydypsia (Excessive Thirst). Genitourinary Complains or has symptoms of Kidney failure/ Dialysis, Incontinence/dribbling. Immunological Complains or has symptoms of Itching - pt stated after dialysis. Denies complaints or symptoms of Hives. Integumentary (Skin) Denies complaints or symptoms of Wounds, Bleeding or bruising tendency, Breakdown, Swelling. Musculoskeletal Timberman, MAXAMILIAN AMADON ([id number]) Denies complaints or symptoms of Muscle Pain, Muscle Weakness. Neurologic Complains or has symptoms of Focal/Weakness. Denies complaints or symptoms of Numbness/parasthesias. Psychiatric Denies complaints or symptoms of Anxiety, Claustrophobia. Objective Constitutional patient is hypertensive.. pulse regular and within target range for patient.Marland Kitchen respirations regular, non-labored and within target range for patient.Marland Kitchen temperature within target range for patient.. Well-nourished and well-hydrated in no acute distress. Vitals Time Taken: 1:12 PM, Height: 71 in, Source: Stated, Weight: 146 lbs, Source: Measured, BMI: 20.4, Temperature: 97.5 F, Pulse: 66 bpm, Respiratory Rate: 18 breaths/min, Blood Pressure: 139/52 mmHg. Eyes conjunctiva clear no eyelid edema noted. pupils equal round and reactive to light and accommodation. Ears, Nose, Mouth, and Throat no gross abnormality of ear auricles or external auditory canals. normal hearing noted during conversation. mucus membranes moist. Respiratory normal breathing without difficulty. clear to auscultation bilaterally. Cardiovascular regular rate and rhythm with normal S1, S2. no clubbing, cyanosis, significant edema, Gastrointestinal (GI) soft, non-tender, non-distended, +BS. no ventral hernia noted. Musculoskeletal unsteady while walking. no significant deformity or arthritic changes, no loss or range of motion, no clubbing. Psychiatric this  patient is able to make decisions and demonstrates good insight into disease process. Alert and Oriented x 3. pleasant and cooperative. General Notes: Patient's wound currently looks like a large eschar both locations that is highly reminiscent of Calciphylaxis. I think this may be the most likely scenario in the setting of the patient's past medical history. With that being said I did perform a light debridement today of the wound bed in order to clear away the eschar and then we gonna subsequently utilize Santyl to try to help with getting this area to clean up in preparation for healing. Integumentary (Hair, Skin) Wound #6 status is Open. Original cause of wound was Blister. The wound is located on the Right Abdomen - Lower Quadrant. The wound measures 6.5cm length x 8.5cm width x 0.1cm depth; 43.393cm^2 area and 4.339cm^3 volume. There is Fat Layer (Subcutaneous Tissue) Exposed exposed. There is no tunneling or undermining noted. There is a medium amount of purulent drainage noted. There is no granulation within the wound bed. There is a large (67-100%) amount of necrotic tissue within the wound bed including Eschar and Adherent Slough. The periwound skin appearance did not exhibit: Kleven, EBUBECHUKWU JEDLICKA. ([id number]) Callus, Crepitus, Excoriation, Induration, Rash, Scarring, Dry/Scaly, Maceration, Atrophie Blanche, Cyanosis, Ecchymosis, Hemosiderin Staining, Mottled, Pallor, Rubor, Erythema. 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 There was a Excisional Skin/Subcutaneous Tissue Debridement with a total area of 55.25 sq cm performed by STONE III, Newell Wafer E., PA-C. With the following instrument(s):  Forceps, and Scissors to remove Viable and Non-Viable tissue/material. Material removed includes Eschar, Subcutaneous Tissue, and Slough after achieving pain control using Lidocaine 4% Topical Solution. No specimens were taken. A time out was conducted at 13:56, prior to the start of the procedure. A Minimum amount of bleeding was controlled with Pressure. The procedure was tolerated well with a pain level of 0 throughout and a pain level of 0 following the procedure. Post Debridement Measurements: 6.5cm length x 8.5cm width x 0.2cm depth; 8.679cm^3 volume. Character of Wound/Ulcer Post Debridement is improved. Post procedure Diagnosis Wound #6: Same as Pre-Procedure Plan Wound Cleansing: Wound #6 Right Abdomen - Lower Quadrant: Clean wound with Normal Saline. Anesthetic (add to Medication List): Wound #6 Right Abdomen - Lower Quadrant: Topical Lidocaine 4% cream applied to wound bed prior to debridement (In Clinic Only). Primary Wound Dressing: Wound #6 Right Abdomen - Lower Quadrant: Santyl Ointment - Please start using santyl once patient has it Iodoflex - Please use iodoflex until santyl is available Secondary Dressing: Wound #6 Right Abdomen - Lower Quadrant: Dry Gauze XtraSorb Dressing Change Frequency: Wound #6 Right Abdomen - Lower Quadrant: Change dressing every day. [name], [name] ([id number]) Follow-up Appointments: Wound #6 Right Abdomen - Lower Quadrant: Return Appointment in 1 week. Home Health: Wound #6 Right Abdomen - Lower Quadrant: [facility] Visits - [facility] please instruct family member in wound care daily and provide wound care supplies for daily dressing changes Home Health Nurse may visit PRN to address patient s wound care needs. FACE TO FACE ENCOUNTER: MEDICARE and MEDICAID PATIENTS: I certify that this patient is under my care and that I had a face-to-face encounter that meets the physician face-to-face encounter requirements with this patient on  this date. The encounter with the patient was in whole or in part for the following MEDICAL CONDITION: (primary reason for [place]) MEDICAL NECESSITY: I certify, that based on my findings, NURSING services are a medically necessary home health service. HOME BOUND STATUS: I certify that my clinical findings support that this patient is homebound (i.e., Due to illness or injury, pt requires aid of supportive devices such as crutches, cane, wheelchairs, walkers, the use of special transportation or the assistance of another person to leave their place of residence. There is a normal inability to leave the home and doing so requires considerable and taxing effort. Other absences are for medical reasons / religious services and are infrequent or of short duration when for other reasons). If current dressing causes regression in wound condition, may D/C ordered dressing product/s and apply Normal Saline Moist Dressing daily until next [place] / Other MD appointment. [place] of regression in wound condition at [phone]. Please direct any NON-WOUND related issues/requests for orders to patient's Primary Care Physician The following medication(s) was prescribed: Santyl topical 250 unit/gram ointment 0 ointment topical applied nickel thick to the wound bed and then cover with a dressing as directed starting 09/11/2018 I'm gonna recommend that we continue over the next week with the above wound care measures. The patient is in agreement with the plan. We will subsequently see were things stand at follow-up. If anything changes or worsens in the meantime the patient will contact the  office and let us know. Please see above for specific wound care orders. We will see patient for re-evaluation in 1 week(s) here in the clinic. If anything worsens or changes patient will contact our office for additional recommendations. Electronic Signature(s) Signed: 09/11/2018  5:48:37 PM By: Worthy Keeler PA-C Entered By: Worthy Keeler on 09/11/2018 14:16:40 Willers, [name] ([id number]) -------------------------------------------------------------------------------- ROS/PFSH Details Patient Name: [name]. Date of Service: 09/11/2018 1:00 PM Medical Record Number: [id number] Patient Account Number: [id number] Date of Birth/Sex: Nov 11, 1940 (77 y.o. M) Treating RN: Secundino Ginger Primary Care Provider: Tedra Senegal Other Clinician: Referring Provider: Referral, Self Treating Provider/Extender: Johnny Hake, Gabby Rackers Weeks in [name]: 0 Information Obtained From Patient Wound History Do you currently have one or more open woundso Yes How many open wounds do you currently haveo 2 Approximately how long have you had your woundso 2 weeks How have you been treating your wound(s) until nowo zinc ,drsg Has your wound(s) ever healed and then re-openedo No Have you had any lab work done in the past montho Yes Who ordered the lab work doneo kidney doctor [place] Have you tested positive for an antibiotic resistant organism (MRSA, VRE)o No Have you tested positive for osteomyelitis (bone infection)o No Have you had any tests for circulation on your legso No Eyes Complaints and Symptoms: Negative for: Dry Eyes; Vision Changes; Glasses / Contacts Medical History: Negative for: Cataracts; Glaucoma; Optic Neuritis Ear/Nose/Mouth/Throat Complaints and Symptoms: Negative for: Difficult clearing ears; Sinusitis Medical History: Negative for: Chronic sinus problems/congestion; Middle ear problems Hematologic/Lymphatic Complaints and Symptoms: Negative for: Bleeding / Clotting Disorders; Human Immunodeficiency Virus Medical History: Positive for: Anemia Negative for: Hemophilia; Human Immunodeficiency Virus; Lymphedema; Sickle Cell Disease Cardiovascular Complaints and Symptoms: Negative for: Chest pain; LE edema Medical History: Positive for: Hypertension;  Peripheral Venous Disease Negative for: Angina; Arrhythmia; Congestive Heart Failure; Coronary Artery Disease; Deep Vein Thrombosis; Hypotension; Myocardial Infarction; Peripheral Arterial Disease; Phlebitis; Vasculitis Past Medical History Notes: TATEVi, Whitesel ([id number]) artificial valve, hyperlipidemia Gastrointestinal Complaints and Symptoms: Negative for: Frequent diarrhea; Nausea; Vomiting Medical History: Negative for: Cirrhosis ; Colitis; Crohnos; Hepatitis A; Hepatitis B; Hepatitis C Past Medical History Notes: scar tissue from multiple surgeries Endocrine Complaints and Symptoms: Negative for: Hepatitis; Thyroid disease; Polydypsia (Excessive Thirst) Medical History: Positive for: Type II Diabetes Negative for: Type I Diabetes Treated with: Oral agents Genitourinary Complaints and Symptoms: Positive for: Kidney failure/ Dialysis; Incontinence/dribbling Medical History: 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 Immunological Complaints and Symptoms: Positive for: Itching - pt stated after dialysis Negative for: Hives Medical History: Negative for: Lupus Erythematosus; Raynaudos; Scleroderma Integumentary (Skin) Complaints and Symptoms: Negative for: Wounds; Bleeding or bruising tendency; Breakdown; Swelling Medical History: Negative for: History of Burn; History of pressure wounds Musculoskeletal Complaints and Symptoms: Negative for: Muscle Pain; Muscle Weakness Medical History: Positive for: Gout Negative for: Rheumatoid Arthritis; Osteoarthritis; Osteomyelitis Neurologic [place], [name] ([id number]) Complaints and Symptoms: Positive for: Focal/Weakness Negative for: Numbness/parasthesias Medical History: Positive for: Neuropathy Negative for: Dementia; Quadriplegia; Paraplegia; Seizure Disorder Psychiatric Complaints and Symptoms: Negative for: Anxiety; Claustrophobia Medical History: Negative for: Anorexia/bulimia; Confinement  Anxiety Oncologic Medical History: Negative for: Received Chemotherapy; Received Radiation Immunizations Pneumococcal Vaccine: Received Pneumococcal Vaccination: Yes Immunization Notes: uo to date Implantable Devices Family and Social History Cancer: No; Diabetes: Yes - Father,Paternal Grandparents; Heart Disease: Yes - Father; Hereditary Spherocytosis: No; Hypertension: Yes - Father; Kidney Disease: No; Lung Disease: No; Seizures: No; Stroke: No; Thyroid Problems: No; Tuberculosis: No; Former  smoker - quit in 1996; Marital Status - Married; Alcohol Use: Never - quit in 1976; Drug Use: No History; Caffeine Use: Never; Financial Concerns: No; Food, Clothing or Shelter Needs: No; Support System Lacking: No; Transportation Concerns: No; Advanced Directives: No; Patient does not want information on Advanced Directives Electronic Signature(s) Signed: 09/11/2018 5:12:49 PM By: Secundino Ginger Signed: 09/11/2018 5:48:37 PM By: Worthy Keeler PA-C Entered By: Secundino Ginger on 09/11/2018 13:27:07 Loth, [name] ([id number]) -------------------------------------------------------------------------------- [place] Details Patient Name: [name]. Date of Service: 09/11/2018 Medical Record Number: [id number] Patient Account Number: [id number] Date of Birth/Sex: 04-20-1941 (77 y.o. M) Treating RN: Cornell Barman Primary Care Provider: Tedra Senegal Other Clinician: Referring Provider: Referral, Self Treating Provider/Extender: Johnny Hake, Han Vejar Weeks in [name]: 0 Diagnosis Coding ICD-10 Codes Code Description E83.59 Other disorders of calcium metabolism L98.492 Non-pressure chronic ulcer of skin of other sites with fat layer exposed E11.622 Type 2 diabetes mellitus with other skin ulcer Z99.2 Dependence on renal dialysis Facility Procedures CPT4 Code: 38250539 Description: 99213 - WOUND CARE VISIT-LEV 3 EST PT Modifier: Quantity: 1 CPT4 Code: 76734193 Description: 79024 - DEB SUBQ TISSUE 20  SQ CM/< ICD-10 Diagnosis Description L98.492 Non-pressure chronic ulcer of skin of other sites with fat la Modifier: yer exposed Quantity: 1 CPT4 Code: 09735329 Description: 11045 - DEB SUBQ TISS EA ADDL 20CM ICD-10 Diagnosis Description L98.492 Non-pressure chronic ulcer of skin of other sites with fat la Modifier: yer exposed Quantity: 2 Physician Procedures CPT4 Code: 9242683 Description: 41962 - WC PHYS LEVEL 4 - EST PT ICD-10 Diagnosis Description E83.59 Other disorders of calcium metabolism L98.492 Non-pressure chronic ulcer of skin of other sites with fat lay E11.622 Type 2 diabetes mellitus with other skin ulcer Z99.2  Dependence on renal dialysis Modifier: 25 er exposed Quantity: 1 CPT4 Code: 2297989 Description: 21194 - WC PHYS SUBQ TISS 20 SQ CM ICD-10 Diagnosis Description L98.492 Non-pressure chronic ulcer of skin of other sites with fat lay Modifier: er exposed Quantity: 1 CPT4 Code: 1740814 Description: 11045 - WC PHYS SUBQ TISS EA ADDL 20 CM ICD-10 Diagnosis Description L98.492 Non-pressure chronic ulcer of skin of other sites with fat lay Modifier: er exposed Quantity: 2 Electronic Signature(s) BOLDEN, HAGERMAN ([id number]) Signed: 09/11/2018 5:48:37 PM By: Worthy Keeler PA-C Signed: 09/12/2018 5:12:41 PM By: Montey Hora Entered By: Montey Hora on 09/11/2018 14:20:56

## 2018-09-14 NOTE — Progress Notes (Signed)
MARCELLIUS, MONTAGNA ([id number]) Visit Report for 09/11/2018 Allergy List Details Patient Name: [name], [name]. Date of Service: 09/11/2018 1:00 PM Medical Record Number: [id number] Patient Account Number: [id number] Date of Birth/Sex: 02-16-1941 (77 y.o. M) Treating RN: Secundino Ginger Primary Care Belynda Pagaduan: Tedra Senegal Other Clinician: Referring Mayjor Ager: Referral, Self Treating Rhett Mutschler/Extender: STONE III, HOYT Weeks in Treatment: 0 Allergies Active Allergies penicillin Reaction: hives Severity: Mild Allergy Notes Electronic Signature(s) Signed: 09/11/2018 5:12:49 PM By: Secundino Ginger Entered By: Secundino Ginger on 09/11/2018 13:22:25 Rosello, [name] ([id number]) -------------------------------------------------------------------------------- Arrival Information Details Patient Name: [name]. Date of Service: 09/11/2018 1:00 PM Medical Record Number: [id number] Patient Account Number: [id number] Date of Birth/Sex: 08/06/1941 (77 y.o. M) Treating RN: Secundino Ginger Primary Care Aleksa Collinsworth: Tedra Senegal Other Clinician: Referring Monroe Toure: Referral, Self Treating Arriona Prest/Extender: [name], HOYT Weeks in Treatment: 0 Visit Information Patient Arrived: Cane Arrival Time: 13:05 Accompanied By: wife Transfer Assistance: None Patient Identification Verified: Yes Secondary Verification Process Completed: Yes History Since Last Visit Added or deleted any medications: No Any new allergies or adverse reactions: No Had a fall or experienced change in activities of daily living that may affect risk of falls: No Signs or symptoms of abuse/neglect since last visito No Hospitalized since last visit: No Implantable device outside of the clinic excluding cellular tissue based products placed in the center since last visit: No Pain Present Now: No Electronic Signature(s) Signed: 09/11/2018 5:12:49 PM By: Secundino Ginger Entered By: Secundino Ginger on 09/11/2018 13:11:54 Gaede, [name]  ([id number]) -------------------------------------------------------------------------------- Clinic Level of Care Assessment Details Patient Name: [name]. Date of Service: 09/11/2018 1:00 PM Medical Record Number: [id number] Patient Account Number: [id number] Date of Birth/Sex: 1941-06-10 (77 y.o. M) Treating RN: Montey Hora Primary Care Maleeka Sabatino: Tedra Senegal Other Clinician: Referring Quaniyah Bugh: Referral, Self Treating Niobe Dick/Extender: [name], HOYT Weeks in Treatment: 0 Clinic Level of Care Assessment Items TOOL 1 Quantity Score []  - Use when EandM and Procedure is performed on INITIAL visit 0 ASSESSMENTS - Nursing Assessment / Reassessment X - General Physical Exam (combine w/ comprehensive assessment (listed just below) when 1 20 performed on new pt. evals) X- 1 25 Comprehensive Assessment (HX, ROS, Risk Assessments, Wounds Hx, etc.) ASSESSMENTS - Wound and Skin Assessment / Reassessment []  - Dermatologic / Skin Assessment (not related to wound area) 0 ASSESSMENTS - Ostomy and/or Continence Assessment and Care []  - Incontinence Assessment and Management 0 []  - 0 Ostomy Care Assessment and Management (repouching, etc.) PROCESS - Coordination of Care X - Simple Patient / Family Education for ongoing care 1 15 []  - 0 Complex (extensive) Patient / Family Education for ongoing care X- 1 10 Staff obtains Programmer, systems, Records, Test Results / Process Orders []  - 0 Staff telephones HHA, Nursing Homes / Clarify orders / etc []  - 0 Routine Transfer to another Facility (non-emergent condition) []  - 0 Routine Hospital Admission (non-emergent condition) X- 1 15 New Admissions / Biomedical engineer / Ordering NPWT, Apligraf, etc. []  - 0 Emergency Hospital Admission (emergent condition) PROCESS - Special Needs []  - Pediatric / Minor Patient Management 0 []  - 0 Isolation Patient Management []  - 0 Hearing / Language / Visual special needs []  - 0 Assessment of  Community assistance (transportation, D/C planning, etc.) []  - 0 Additional assistance / Altered mentation []  - 0 Support Surface(s) Assessment (bed, cushion, seat, etc.) Rohrbach, [name] ([id number]) INTERVENTIONS - Miscellaneous []  - External ear exam 0 []  - 0 Patient Transfer (multiple staff / Civil Service fast streamer / Similar  devices) []  - 0 Simple Staple / Suture removal (25 or less) []  - 0 Complex Staple / Suture removal (26 or more) []  - 0 Hypo/Hyperglycemic Management (do not check if billed separately) []  - 0 Ankle / Brachial Index (ABI) - do not check if billed separately Has the patient been seen at the hospital within the last three years: Yes Total Score: 85 Level Of Care: New/Established - Level 3 Electronic Signature(s) Signed: 09/12/2018 5:12:41 PM By: Montey Hora Entered By: Montey Hora on 09/11/2018 14:20:47 Leavy, [name] ([id number]) -------------------------------------------------------------------------------- Lower Extremity Assessment Details Patient Name: [name]. Date of Service: 09/11/2018 1:00 PM Medical Record Number: [id number] Patient Account Number: [id number] Date of Birth/Sex: 1940-12-05 (77 y.o. M) Treating RN: Secundino Ginger Primary Care Ariz Terrones: Tedra Senegal Other Clinician: Referring Raymont Andreoni: Referral, Self Treating Kobyn Kray/Extender: [name], HOYT Weeks in Treatment: 0 Electronic Signature(s) Signed: 09/11/2018 5:12:49 PM By: Secundino Ginger Entered By: Secundino Ginger on 09/11/2018 13:38:04 Scardino, [name] ([id number]) -------------------------------------------------------------------------------- Multi Wound Chart Details Patient Name: [name]. Date of Service: 09/11/2018 1:00 PM Medical Record Number: [id number] Patient Account Number: [id number] Date of Birth/Sex: 05/03/1941 (77 y.o. M) Treating RN: Montey Hora Primary Care Alexandr Yaworski: Tedra Senegal Other Clinician: Referring Jdyn Parkerson: Referral, Self Treating Elier Zellars/Extender:  STONE III, HOYT Weeks in Treatment: 0 Vital Signs Height(in): 71 Pulse(bpm): 66 Weight(lbs): 146 Blood Pressure(mmHg): 139/52 Body Mass Index(BMI): 20 Temperature(F): 97.5 Respiratory Rate 18 (breaths/min): Photos: [6:No Photos] [N/A:N/A] Wound Location: [6:Right Abdomen - Lower Quadrant] [N/A:N/A] Wounding Event: [6:Blister] [N/A:N/A] Primary Etiology: [6:Abscess] [N/A:N/A] Comorbid History: [6:Anemia, Hypertension, Peripheral Venous Disease, Type II Diabetes, End Stage Renal Disease, Gout, Neuropathy] [N/A:N/A] Date Acquired: [6:08/28/2018] [N/A:N/A] Weeks of Treatment: [6:0] [N/A:N/A] Wound Status: [6:Open] [N/A:N/A] Measurements L x W x D [6:6.5x8.5x0.1] [N/A:N/A] (cm) Area (cm) : [6:43.393] [N/A:N/A] Volume (cm) : [6:4.339] [N/A:N/A] Classification: [6:Full Thickness Without Exposed Support Structures] [N/A:N/A] Exudate Amount: [6:Medium] [N/A:N/A] Exudate Type: [6:Purulent] [N/A:N/A] Exudate Color: [6:yellow, brown, green] [N/A:N/A] Granulation Amount: [6:None Present (0%)] [N/A:N/A] Necrotic Amount: [6:Large (67-100%)] [N/A:N/A] Necrotic Tissue: [6:Eschar, Adherent Slough] [N/A:N/A] Exposed Structures: [6:Fat Layer (Subcutaneous Tissue) Exposed: Yes Fascia: No Tendon: No Muscle: No Joint: No Bone: No] [N/A:N/A] Periwound Skin Texture: [6:Excoriation: No Induration: No Callus: No Crepitus: No] [N/A:N/A] Rash: No Scarring: No Periwound Skin Moisture: Maceration: No N/A N/A Dry/Scaly: No Periwound Skin Color: Atrophie Blanche: No N/A N/A Cyanosis: No Ecchymosis: No Erythema: No Hemosiderin Staining: No Mottled: No Pallor: No Rubor: No Tenderness on Palpation: No N/A N/A Wound Preparation: Ulcer Cleansing: N/A N/A Rinsed/Irrigated with Saline Topical Anesthetic Applied: Other: lidocaine 4% Treatment Notes Electronic Signature(s) Signed: 09/12/2018 5:12:41 PM By: Montey Hora Entered By: Montey Hora on 09/11/2018 13:57:39 Ruder, [name]  ([id number]) -------------------------------------------------------------------------------- Green Details Patient Name: [name]. Date of Service: 09/11/2018 1:00 PM Medical Record Number: [id number] Patient Account Number: [id number] Date of Birth/Sex: 08/20/1941 (77 y.o. M) Treating RN: Montey Hora Primary Care Bhumi Godbey: Tedra Senegal Other Clinician: Referring Yonna Alwin: Referral, Self Treating Lerry Cordrey/Extender: [name], HOYT Weeks in Treatment: 0 Active Inactive Abuse / Safety / Falls / Self Care Management Nursing Diagnoses: Impaired physical mobility Goals: Patient will remain injury free related to falls Date Initiated: 09/11/2018 Target Resolution Date: 11/29/2018 Goal Status: Active Interventions: Assess fall risk on admission and as needed Notes: Nutrition Nursing Diagnoses: Potential for alteratiion in Nutrition/Potential for imbalanced nutrition Goals: Patient/caregiver agrees to and verbalizes understanding of need to use nutritional supplements and/or vitamins as prescribed Date Initiated: 09/11/2018 Target Resolution Date: 11/29/2018 Goal  Status: Active Interventions: Assess patient nutrition upon admission and as needed per policy Notes: Orientation to the Wound Care Program Nursing Diagnoses: Knowledge deficit related to the wound healing center program Goals: Patient/caregiver will verbalize understanding of the [facility] Date Initiated: 09/11/2018 Target Resolution Date: 11/29/2018 Goal Status: Active Interventions: Provide education on orientation to the wound center Culpepper, [name] ([id number]) Notes: Wound/Skin Impairment Nursing Diagnoses: Impaired tissue integrity Goals: Ulcer/skin breakdown will heal within 14 weeks Date Initiated: 09/11/2018 Target Resolution Date: 11/29/2018 Goal Status: Active Interventions: Assess patient/caregiver ability to obtain necessary supplies Assess  patient/caregiver ability to perform ulcer/skin care regimen upon admission and as needed Assess ulceration(s) every visit Notes: Electronic Signature(s) Signed: 09/12/2018 5:12:41 PM By: Montey Hora Entered By: Montey Hora on 09/11/2018 13:57:21 Niehaus, [name] ([id number]) -------------------------------------------------------------------------------- Pain Assessment Details Patient Name: [name]. Date of Service: 09/11/2018 1:00 PM Medical Record Number: [id number] Patient Account Number: [id number] Date of Birth/Sex: 1940/11/10 (77 y.o. M) Treating RN: Secundino Ginger Primary Care Eda Magnussen: Tedra Sen[place] Other Clinician: Referring Ever Halberg: Referral, Self Treating Shericka Johnstone/Extender: [name], HOYT Weeks in Treatment: 0 Active Problems Location of Pain Severity and Description of Pain Patient Has Paino No Site Locations Pain Management and Medication Current Pain Management: Notes pt denies any pain at this time. Electronic Signature(s) Signed: 09/11/2018 5:12:49 PM By: Secundino Ginger Entered By: Secundino Ginger on 09/11/2018 13:12:12 Hammock, [name] ([id number]) -------------------------------------------------------------------------------- Patient/Caregiver Education Details Patient Name: [name]. Date of Service: 09/11/2018 1:00 PM Medical Record Number: [id number] Patient Account Number: [id number] Date of Birth/Gender: [birth year] (77 y.o. M) Treating RN: Montey Hora Primary Care Physician: Tedra Senegal Other Clinician: Referring Physician: Referral, Self Treating Physician/Extender: Sharalyn Ink in Treatment: 0 Education Assessment Education Provided To: Patient Education Topics Provided Wound/Skin Impairment: Handouts: Other: wound care as ordered Methods: Demonstration, Explain/Verbal Responses: State content correctly Electronic Signature(s) Signed: 09/12/2018 5:12:41 PM By: Montey Hora Entered By: Montey Hora on 09/11/2018  14:21:38 Bryden, [name] ([id number]) -------------------------------------------------------------------------------- Wound Assessment Details Patient Name: [name]. Date of Service: 09/11/2018 1:00 PM Medical Record Number: [id number] Patient Account Number: [id number] Date of Birth/Sex: May 30, 1941 (77 y.o. M) Treating RN: Secundino Ginger Primary Care Tesneem Dufrane: Tedra Senegal Other Clinician: Referring Cashawn Yanko: Referral, Self Treating Isobelle Tuckett/Extender: STONE III, HOYT Weeks in Treatment: 0 Wound Status Wound Number: 6 Primary Abscess Etiology: Wound Location: Right Abdomen - Lower Quadrant Wound Open Wounding Event: Blister Status: Date Acquired: 08/28/2018 Comorbid Anemia, Hypertension, Peripheral Venous Weeks Of Treatment: 0 History: Disease, Type II Diabetes, End Stage Renal Clustered Wound: No Disease, Gout, Neuropathy Photos Photo Uploaded By: Secundino Ginger on 09/11/2018 16:06:30 Wound Measurements Length: (cm) 6.5 Width: (cm) 8.5 Depth: (cm) 0.1 Area: (cm) 43.393 Volume: (cm) 4.339 % Reduction in Area: % Reduction in Volume: Tunneling: No Undermining: No Wound Description Full Thickness Without Exposed Support Foul Classification: Structures Slou Exudate Medium Amount: Exudate Type: Purulent Exudate Color: yellow, brown, green Odor After Cleansing: No gh/Fibrino Yes Wound Bed Granulation Amount: None Present (0%) Exposed Structure Necrotic Amount: Large (67-100%) Fascia Exposed: No Necrotic Quality: Eschar, Adherent Slough Fat Layer (Subcutaneous Tissue) Exposed: Yes Tendon Exposed: No Muscle Exposed: No Joint Exposed: No Bone Exposed: No Vandruff, [name] ([id number]) Periwound Skin Texture Texture Color No Abnormalities Noted: No No Abnormalities Noted: No Callus: No Atrophie Blanche: No Crepitus: No Cyanosis: No Excoriation: No Ecchymosis: No Induration: No Erythema: No Rash: No Hemosiderin Staining: No Scarring: No Mottled:  No Pallor: No Moisture Rubor: No No Abnormalities Noted:  No Dry / Scaly: No Maceration: No Wound Preparation Ulcer Cleansing: Rinsed/Irrigated with Saline Topical Anesthetic Applied: Other: lidocaine 4%, Treatment Notes Wound #6 (Right Abdomen - Lower Quadrant) Notes santyl, gauze and bordered foam dressing Electronic Signature(s) Signed: 09/11/2018 5:12:49 PM By: Secundino Ginger Entered By: Secundino Ginger on 09/11/2018 13:37:54 Derner, [name] ([id number]) -------------------------------------------------------------------------------- Vitals Details Patient Name: [name]. Date of Service: 09/11/2018 1:00 PM Medical Record Number: [id number] Patient Account Number: [id number] Date of Birth/Sex: 1941-07-08 (77 y.o. M) Treating RN: Secundino Ginger Primary Care Tazaria Dlugosz: Tedra Senegal Other Clinician: Referring Denzal Meir: Referral, Self Treating Alleta Avery/Extender: STONE III, HOYT Weeks in Treatment: 0 Vital Signs Time Taken: 13:12 Temperature (F): 97.5 Height (in): 71 Pulse (bpm): 66 Source: Stated Respiratory Rate (breaths/min): 18 Weight (lbs): 146 Blood Pressure (mmHg): 139/52 Source: Measured Reference Range: 80 - 120 mg / dl Body Mass Index (BMI): 20.4 Electronic Signature(s) Signed: 09/11/2018 5:12:49 PM By: Secundino Ginger Entered By: Secundino Ginger on 09/11/2018 13:21:36

## 2018-09-15 ENCOUNTER — Telehealth: Payer: Self-pay | Admitting: Endocrinology

## 2018-09-15 ENCOUNTER — Other Ambulatory Visit: Payer: Self-pay | Admitting: Nurse Practitioner

## 2018-09-15 MED ORDER — MEMANTINE HCL 10 MG PO TABS: 10.0000 mg | ORAL_TABLET | Freq: Two times a day (BID) | ORAL | 1 refills | Status: DC

## 2018-09-15 NOTE — Telephone Encounter (Signed)
Patients wife called the [facility] and stated that she thinks her husband needs a new Glucose meter as theirs got water in it. It works some of the time. They are requesting a new one.   Please advise

## 2018-09-15 NOTE — Telephone Encounter (Signed)
Called pt and notified her via detailed voicemail stating that pt can come pick up another meter.

## 2018-09-16 DIAGNOSIS — D631 Anemia in chronic kidney disease: Secondary | ICD-10-CM | POA: Diagnosis not present

## 2018-09-16 DIAGNOSIS — D509 Iron deficiency anemia, unspecified: Secondary | ICD-10-CM | POA: Diagnosis not present

## 2018-09-16 DIAGNOSIS — N186 End stage renal disease: Secondary | ICD-10-CM | POA: Diagnosis not present

## 2018-09-16 DIAGNOSIS — E1129 Type 2 diabetes mellitus with other diabetic kidney complication: Secondary | ICD-10-CM | POA: Diagnosis not present

## 2018-09-16 DIAGNOSIS — N2581 Secondary hyperparathyroidism of renal origin: Secondary | ICD-10-CM | POA: Diagnosis not present

## 2018-09-19 DIAGNOSIS — D631 Anemia in chronic kidney disease: Secondary | ICD-10-CM | POA: Diagnosis not present

## 2018-09-19 DIAGNOSIS — N2581 Secondary hyperparathyroidism of renal origin: Secondary | ICD-10-CM | POA: Diagnosis not present

## 2018-09-19 DIAGNOSIS — D509 Iron deficiency anemia, unspecified: Secondary | ICD-10-CM | POA: Diagnosis not present

## 2018-09-19 DIAGNOSIS — N186 End stage renal disease: Secondary | ICD-10-CM | POA: Diagnosis not present

## 2018-09-19 DIAGNOSIS — E1129 Type 2 diabetes mellitus with other diabetic kidney complication: Secondary | ICD-10-CM | POA: Diagnosis not present

## 2018-09-20 NOTE — Patient Instructions (Signed)
It was a pleasure to see you doing well today.  Follow-up in 6 months.

## 2018-09-21 ENCOUNTER — Encounter: Payer: Self-pay | Admitting: Internal Medicine

## 2018-09-21 DIAGNOSIS — D509 Iron deficiency anemia, unspecified: Secondary | ICD-10-CM | POA: Diagnosis not present

## 2018-09-21 DIAGNOSIS — D631 Anemia in chronic kidney disease: Secondary | ICD-10-CM | POA: Diagnosis not present

## 2018-09-21 DIAGNOSIS — E1129 Type 2 diabetes mellitus with other diabetic kidney complication: Secondary | ICD-10-CM | POA: Diagnosis not present

## 2018-09-21 DIAGNOSIS — N2581 Secondary hyperparathyroidism of renal origin: Secondary | ICD-10-CM | POA: Diagnosis not present

## 2018-09-21 DIAGNOSIS — N186 End stage renal disease: Secondary | ICD-10-CM | POA: Diagnosis not present

## 2018-09-23 ENCOUNTER — Ambulatory Visit: Payer: Medicare Other | Admitting: Family Medicine

## 2018-09-23 DIAGNOSIS — E1129 Type 2 diabetes mellitus with other diabetic kidney complication: Secondary | ICD-10-CM | POA: Diagnosis not present

## 2018-09-23 DIAGNOSIS — N2581 Secondary hyperparathyroidism of renal origin: Secondary | ICD-10-CM | POA: Diagnosis not present

## 2018-09-23 DIAGNOSIS — D509 Iron deficiency anemia, unspecified: Secondary | ICD-10-CM | POA: Diagnosis not present

## 2018-09-23 DIAGNOSIS — N186 End stage renal disease: Secondary | ICD-10-CM | POA: Diagnosis not present

## 2018-09-23 DIAGNOSIS — D631 Anemia in chronic kidney disease: Secondary | ICD-10-CM | POA: Diagnosis not present

## 2018-09-24 DIAGNOSIS — E1122 Type 2 diabetes mellitus with diabetic chronic kidney disease: Secondary | ICD-10-CM | POA: Diagnosis not present

## 2018-09-24 DIAGNOSIS — Z992 Dependence on renal dialysis: Secondary | ICD-10-CM | POA: Diagnosis not present

## 2018-09-24 DIAGNOSIS — N186 End stage renal disease: Secondary | ICD-10-CM | POA: Diagnosis not present

## 2018-09-25 ENCOUNTER — Encounter: Payer: Medicare Other | Admitting: Physical Medicine & Rehabilitation

## 2018-09-26 DIAGNOSIS — N2581 Secondary hyperparathyroidism of renal origin: Secondary | ICD-10-CM | POA: Diagnosis not present

## 2018-09-26 DIAGNOSIS — D631 Anemia in chronic kidney disease: Secondary | ICD-10-CM | POA: Diagnosis not present

## 2018-09-26 DIAGNOSIS — D509 Iron deficiency anemia, unspecified: Secondary | ICD-10-CM | POA: Diagnosis not present

## 2018-09-26 DIAGNOSIS — N186 End stage renal disease: Secondary | ICD-10-CM | POA: Diagnosis not present

## 2018-09-26 DIAGNOSIS — E1129 Type 2 diabetes mellitus with other diabetic kidney complication: Secondary | ICD-10-CM | POA: Diagnosis not present

## 2018-09-29 DIAGNOSIS — N2581 Secondary hyperparathyroidism of renal origin: Secondary | ICD-10-CM | POA: Diagnosis not present

## 2018-09-29 DIAGNOSIS — D631 Anemia in chronic kidney disease: Secondary | ICD-10-CM | POA: Diagnosis not present

## 2018-09-29 DIAGNOSIS — E1129 Type 2 diabetes mellitus with other diabetic kidney complication: Secondary | ICD-10-CM | POA: Diagnosis not present

## 2018-09-29 DIAGNOSIS — D509 Iron deficiency anemia, unspecified: Secondary | ICD-10-CM | POA: Diagnosis not present

## 2018-09-29 DIAGNOSIS — N186 End stage renal disease: Secondary | ICD-10-CM | POA: Diagnosis not present

## 2018-10-01 DIAGNOSIS — E1129 Type 2 diabetes mellitus with other diabetic kidney complication: Secondary | ICD-10-CM | POA: Diagnosis not present

## 2018-10-01 DIAGNOSIS — N2581 Secondary hyperparathyroidism of renal origin: Secondary | ICD-10-CM | POA: Diagnosis not present

## 2018-10-01 DIAGNOSIS — D509 Iron deficiency anemia, unspecified: Secondary | ICD-10-CM | POA: Diagnosis not present

## 2018-10-01 DIAGNOSIS — N186 End stage renal disease: Secondary | ICD-10-CM | POA: Diagnosis not present

## 2018-10-01 DIAGNOSIS — D631 Anemia in chronic kidney disease: Secondary | ICD-10-CM | POA: Diagnosis not present

## 2018-10-02 ENCOUNTER — Encounter: Payer: Medicare Other | Attending: Physician Assistant | Admitting: Physician Assistant

## 2018-10-02 DIAGNOSIS — E1140 Type 2 diabetes mellitus with diabetic neuropathy, unspecified: Secondary | ICD-10-CM | POA: Insufficient documentation

## 2018-10-02 DIAGNOSIS — N186 End stage renal disease: Secondary | ICD-10-CM | POA: Diagnosis not present

## 2018-10-02 DIAGNOSIS — E1122 Type 2 diabetes mellitus with diabetic chronic kidney disease: Secondary | ICD-10-CM | POA: Insufficient documentation

## 2018-10-02 DIAGNOSIS — Z8249 Family history of ischemic heart disease and other diseases of the circulatory system: Secondary | ICD-10-CM | POA: Insufficient documentation

## 2018-10-02 DIAGNOSIS — M109 Gout, unspecified: Secondary | ICD-10-CM | POA: Insufficient documentation

## 2018-10-02 DIAGNOSIS — E1151 Type 2 diabetes mellitus with diabetic peripheral angiopathy without gangrene: Secondary | ICD-10-CM | POA: Diagnosis not present

## 2018-10-02 DIAGNOSIS — Z992 Dependence on renal dialysis: Secondary | ICD-10-CM | POA: Insufficient documentation

## 2018-10-02 DIAGNOSIS — N2581 Secondary hyperparathyroidism of renal origin: Secondary | ICD-10-CM | POA: Diagnosis not present

## 2018-10-02 DIAGNOSIS — G3184 Mild cognitive impairment, so stated: Secondary | ICD-10-CM | POA: Insufficient documentation

## 2018-10-02 DIAGNOSIS — E11622 Type 2 diabetes mellitus with other skin ulcer: Secondary | ICD-10-CM | POA: Insufficient documentation

## 2018-10-02 DIAGNOSIS — L98492 Non-pressure chronic ulcer of skin of other sites with fat layer exposed: Secondary | ICD-10-CM | POA: Insufficient documentation

## 2018-10-02 DIAGNOSIS — Z833 Family history of diabetes mellitus: Secondary | ICD-10-CM | POA: Insufficient documentation

## 2018-10-02 DIAGNOSIS — I714 Abdominal aortic aneurysm, without rupture: Secondary | ICD-10-CM | POA: Insufficient documentation

## 2018-10-02 DIAGNOSIS — D631 Anemia in chronic kidney disease: Secondary | ICD-10-CM | POA: Diagnosis not present

## 2018-10-02 DIAGNOSIS — Z87891 Personal history of nicotine dependence: Secondary | ICD-10-CM | POA: Diagnosis not present

## 2018-10-02 DIAGNOSIS — E785 Hyperlipidemia, unspecified: Secondary | ICD-10-CM | POA: Insufficient documentation

## 2018-10-02 DIAGNOSIS — I120 Hypertensive chronic kidney disease with stage 5 chronic kidney disease or end stage renal disease: Secondary | ICD-10-CM | POA: Diagnosis not present

## 2018-10-03 ENCOUNTER — Other Ambulatory Visit: Payer: Self-pay | Admitting: Neurological Surgery

## 2018-10-03 DIAGNOSIS — N186 End stage renal disease: Secondary | ICD-10-CM | POA: Diagnosis not present

## 2018-10-03 DIAGNOSIS — E1129 Type 2 diabetes mellitus with other diabetic kidney complication: Secondary | ICD-10-CM | POA: Diagnosis not present

## 2018-10-03 DIAGNOSIS — N2581 Secondary hyperparathyroidism of renal origin: Secondary | ICD-10-CM | POA: Diagnosis not present

## 2018-10-03 DIAGNOSIS — S065XAA Traumatic subdural hemorrhage with loss of consciousness status unknown, initial encounter: Secondary | ICD-10-CM

## 2018-10-03 DIAGNOSIS — D631 Anemia in chronic kidney disease: Secondary | ICD-10-CM | POA: Diagnosis not present

## 2018-10-03 DIAGNOSIS — S065X9A Traumatic subdural hemorrhage with loss of consciousness of unspecified duration, initial encounter: Secondary | ICD-10-CM

## 2018-10-03 DIAGNOSIS — D509 Iron deficiency anemia, unspecified: Secondary | ICD-10-CM | POA: Diagnosis not present

## 2018-10-04 NOTE — Progress Notes (Signed)
[name], [name] ([id number]) Visit Report for 10/02/2018 Arrival Information Details Patient Name: [name], [name]. Date of Service: 10/02/2018 10:30 AM Medical Record Number: [id number] Patient Account Number: [id number] Date of Birth/Sex: 02-08-1941 (77 y.o. M) Treating RN: Montey Hora Primary Care Capricia Serda: Tedra Senegal Other Clinician: Referring Georgia Baria: Tedra Senegal Treating Ethelle Ola/Extender: [name], HOYT Weeks in Treatment: 3 Visit Information History Since Last Visit Added or deleted any medications: No Patient Arrived: Walker Any new allergies or adverse reactions: No Arrival Time: 10:25 Had a fall or experienced change in No Accompanied By: self activities of daily living that may affect Transfer Assistance: None risk of falls: Patient Identification Verified: Yes Signs or symptoms of abuse/neglect since last visito No Secondary Verification Process Completed: Yes Hospitalized since last visit: No Implantable device outside of the clinic excluding No cellular tissue based products placed in the center since last visit: Has Dressing in Place as Prescribed: Yes Pain Present Now: No Electronic Signature(s) Signed: 10/03/2018 3:31:07 PM By: Lorine Bears RCP, RRT, CHT Entered By: Becky Sax, Amado Nash on 10/02/2018 10:25:47 [name] ([id number]) -------------------------------------------------------------------------------- Clinic Level of Care Assessment Details Patient Name: [name], [name]. Date of Service: 10/02/2018 10:30 AM Medical Record Number: [id number] Patient Account Number: [id number] Date of Birth/Sex: [birth year] (77 y.o. M) Treating RN: Harold Barban Primary Care Eboni Coval: Tedra Senegal Other Clinician: Referring Leandria Thier: Tedra Senegal Treating Alexio Sroka/Extender: [name], HOYT Weeks in Treatment: 3 Clinic Level of Care Assessment Items TOOL 2 Quantity Score []  - Use when only an EandM is performed on the INITIAL visit  0 ASSESSMENTS - Nursing Assessment / Reassessment X - General Physical Exam (combine w/ comprehensive assessment (listed just below) when 1 20 performed on new pt. evals) X- 1 25 Comprehensive Assessment (HX, ROS, Risk Assessments, Wounds Hx, etc.) ASSESSMENTS - Wound and Skin Assessment / Reassessment X - Simple Wound Assessment / Reassessment - one wound 1 5 []  - 0 Complex Wound Assessment / Reassessment - multiple wounds []  - 0 Dermatologic / Skin Assessment (not related to wound area) ASSESSMENTS - Ostomy and/or Continence Assessment and Care []  - Incontinence Assessment and Management 0 []  - 0 Ostomy Care Assessment and Management (repouching, etc.) PROCESS - Coordination of Care X - Simple Patient / Family Education for ongoing care 1 15 []  - 0 Complex (extensive) Patient / Family Education for ongoing care X- 1 10 Staff obtains Programmer, systems, Records, Test Results / Process Orders []  - 0 Staff telephones HHA, Nursing Homes / Clarify orders / etc []  - 0 Routine Transfer to another Facility (non-emergent condition) []  - 0 Routine Hospital Admission (non-emergent condition) []  - 0 New Admissions / Biomedical engineer / Ordering NPWT, Apligraf, etc. []  - 0 Emergency Hospital Admission (emergent condition) X- 1 10 Simple Discharge Coordination []  - 0 Complex (extensive) Discharge Coordination PROCESS - Special Needs []  - Pediatric / Minor Patient Management 0 []  - 0 Isolation Patient Management [name] ([id number]) []  - 0 Hearing / Language / Visual special needs []  - 0 Assessment of Community assistance (transportation, D/C planning, etc.) []  - 0 Additional assistance / Altered mentation []  - 0 Support Surface(s) Assessment (bed, cushion, seat, etc.) INTERVENTIONS - Wound Cleansing / Measurement X - Wound Imaging (photographs - any number of wounds) 1 5 []  - 0 Wound Tracing (instead of photographs) X- 1 5 Simple Wound Measurement - one wound []  -  0 Complex Wound Measurement - multiple wounds X- 1 5 Simple Wound Cleansing - one wound []  - 0  Complex Wound Cleansing - multiple wounds INTERVENTIONS - Wound Dressings X - Small Wound Dressing one or multiple wounds 1 10 []  - 0 Medium Wound Dressing one or multiple wounds []  - 0 Large Wound Dressing one or multiple wounds []  - 0 Application of Medications - injection INTERVENTIONS - Miscellaneous []  - External ear exam 0 []  - 0 Specimen Collection (cultures, biopsies, blood, body fluids, etc.) []  - 0 Specimen(s) / Culture(s) sent or taken to Lab for analysis []  - 0 Patient Transfer (multiple staff / Harrel Lemon Lift / Similar devices) []  - 0 Simple Staple / Suture removal (25 or less) []  - 0 Complex Staple / Suture removal (26 or more) []  - 0 Hypo / Hyperglycemic Management (close monitor of Blood Glucose) []  - 0 Ankle / Brachial Index (ABI) - do not check if billed separately Has the patient been seen at the hospital within the last three years: Yes Total Score: 110 Level Of Care: New/Established - Level 3 Electronic Signature(s) Signed: 10/02/2018 2:41:13 PM By: Harold Barban Entered By: Harold Barban on 10/02/2018 10:52:49 [name] ([id number]) -------------------------------------------------------------------------------- Encounter Discharge Information Details Patient Name: [name]. Date of Service: 10/02/2018 10:30 AM Medical Record Number: [id number] Patient Account Number: [id number] Date of Birth/Sex: 02-18-1941 (77 y.o. M) Treating RN: Cornell Barman Primary Care [place] Severs: Tedra Senegal Other Clinician: Referring Jonathan Corpus: Tedra Senegal Treating Jae Skeet/Extender: [name], HOYT Weeks in Treatment: 3 Encounter Discharge Information Items Discharge Condition: Stable Ambulatory Status: Walker Discharge Destination: Home Transportation: Private Auto Accompanied By: self Schedule Follow-up Appointment: Yes Clinical Summary of Care: Electronic  Signature(s) Signed: 10/03/2018 3:19:28 PM By: Gretta Cool, BSN, RN, CWS, Kim RN, BSN Entered By: Gretta Cool, BSN, RN, CWS, Kim on 10/02/2018 10:55:27 [name], [name] ([id number]) -------------------------------------------------------------------------------- Lower Extremity Assessment Details Patient Name: [name]. Date of Service: 10/02/2018 10:30 AM Medical Record Number: [id number] Patient Account Number: [id number] Date of Birth/Sex: April 17, 1941 (77 y.o. M) Treating RN: Montey Hora Primary Care Janet Humphreys: Tedra Senegal Other Clinician: Referring Aerilyn Slee: Tedra Senegal Treating Gweneth Fredlund/Extender: [name], HOYT Weeks in Treatment: 3 Electronic Signature(s) Signed: 10/02/2018 5:07:23 PM By: Montey Hora Entered By: Montey Hora on 10/02/2018 10:39:32 [name], [name] ([id number]) -------------------------------------------------------------------------------- Multi Wound Chart Details Patient Name: [name]. Date of Service: 10/02/2018 10:30 AM Medical Record Number: [id number] Patient Account Number: [id number] Date of Birth/Sex: 03/14/1941 (77 y.o. M) Treating RN: Harold Barban Primary Care Ademide Schaberg: Tedra Senegal Other Clinician: Referring Shelisha Gautier: Tedra Senegal Treating Owen Pratte/Extender: [name], HOYT Weeks in Treatment: 3 Vital Signs Height(in): 71 Pulse(bpm): 81 Weight(lbs): 146 Blood Pressure(mmHg): 105/73 Body Mass Index(BMI): 20 Temperature(F): 94.5 Respiratory Rate 16 (breaths/min): Photos: [6:No Photos] [N/A:N/A] Wound Location: [6:Right Abdomen - Lower Quadrant] [N/A:N/A] Wounding Event: [6:Blister] [N/A:N/A] Primary Etiology: [6:Abscess] [N/A:N/A] Comorbid History: [6:Anemia, Hypertension, Peripheral Venous Disease, Type II Diabetes, End Stage Renal Disease, Gout, Neuropathy] [N/A:N/A] Date Acquired: [6:08/28/2018] [N/A:N/A] Weeks of Treatment: [6:3] [N/A:N/A] Wound Status: [6:Open] [N/A:N/A] Measurements L x W x D [6:2.6x5.3x0.1]  [N/A:N/A] (cm) Area (cm) : [6:10.823] [N/A:N/A] Volume (cm) : [6:1.082] [N/A:N/A] % Reduction in Area: [6:75.10%] [N/A:N/A] % Reduction in Volume: [6:75.10%] [N/A:N/A] Classification: [6:Full Thickness Without Exposed Support Structures] [N/A:N/A] Exudate Amount: [6:Medium] [N/A:N/A] Exudate Type: [6:Purulent] [N/A:N/A] Exudate Color: [6:yellow, brown, green] [N/A:N/A] Wound Margin: [6:Flat and Intact] [N/A:N/A] Granulation Amount: [6:Large (67-100%)] [N/A:N/A] Granulation Quality: [6:Red] [N/A:N/A] Necrotic Amount: [6:Small (1-33%)] [N/A:N/A] Exposed Structures: [6:Fat Layer (Subcutaneous Tissue) Exposed: Yes Fascia: No Tendon: No Muscle: No Joint: No Bone: No] [N/A:N/A] Epithelialization: [6:None] [N/A:N/A] Periwound Skin Texture: [N/A:N/A]  Scarring: Yes Excoriation: No Induration: No Callus: No Crepitus: No Rash: No Periwound Skin Moisture: Maceration: No N/A N/A Dry/Scaly: No Periwound Skin Color: Atrophie Blanche: No N/A N/A Cyanosis: No Ecchymosis: No Erythema: No Hemosiderin Staining: No Mottled: No Pallor: No Rubor: No Temperature: No Abnormality N/A N/A Tenderness on Palpation: Yes N/A N/A Wound Preparation: Ulcer Cleansing: N/A N/A Rinsed/Irrigated with Saline Topical Anesthetic Applied: Other: lidocaine 4% Treatment Notes Electronic Signature(s) Signed: 10/02/2018 2:41:13 PM By: Harold Barban Entered By: Harold Barban on 10/02/2018 10:45:03 [name], [name] ([id number]) -------------------------------------------------------------------------------- [place] Details Patient Name: [name]. Date of Service: 10/02/2018 10:30 AM Medical Record Number: [id number] Patient Account Number: [id number] Date of Birth/Sex: 1941-07-01 (77 y.o. M) Treating RN: Harold Barban Primary Care Bernita Beckstrom: Tedra Senegal Other Clinician: Referring Gustavia Carie: Tedra [place]egal Treating Magnum Lunde/Extender: [name], HOYT Weeks in Treatment: 3 Active  Inactive Abuse / Safety / Falls / Self Care Management Nursing Diagnoses: Impaired physical mobility Goals: Patient will remain injury free related to falls Date Initiated: 09/11/2018 Target Resolution Date: 11/29/2018 Goal Status: Active Interventions: Assess fall risk on admission and as needed Notes: Nutrition Nursing Diagnoses: Potential for alteratiion in Nutrition/Potential for imbalanced nutrition Goals: Patient/caregiver agrees to and verbalizes understanding of need to use nutritional supplements and/or vitamins as prescribed Date Initiated: 09/11/2018 Target Resolution Date: 11/29/2018 Goal Status: Active Interventions: Assess patient nutrition upon admission and as needed per policy Notes: Orientation to the Wound Care Program Nursing Diagnoses: Knowledge deficit related to the wound healing center program Goals: Patient/caregiver will verbalize understanding of the [place] Program Date Initiated: 09/11/2018 Target Resolution Date: 11/29/2018 Goal Status: Active Interventions: Provide education on orientation to the wound center Cinquemani, [name] ([id number]) Notes: Wound/Skin Impairment Nursing Diagnoses: Impaired tissue integrity Goals: Ulcer/skin breakdown will heal within 14 weeks Date Initiated: 09/11/2018 Target Resolution Date: 11/29/2018 Goal Status: Active Interventions: Assess patient/caregiver ability to obtain necessary supplies Assess patient/caregiver ability to perform ulcer/skin care regimen upon admission and as needed Assess ulceration(s) every visit Notes: Electronic Signature(s) Signed: 10/02/2018 2:41:13 PM By: Harold Barban Entered By: Harold Barban on 10/02/2018 10:37:11 [name], [name] ([id number]) -------------------------------------------------------------------------------- Pain Assessment Details Patient Name: [name]. Date of Service: 10/02/2018 10:30 AM Medical Record Number: [id number] Patient Account  Number: [id number] Date of Birth/Sex: Sep 03, 1941 (77 y.o. M) Treating RN: Montey Hora Primary Care Aedyn Kempfer: Tedra Senegal Other Clinician: Referring Nik Gorrell: Tedra [place]egal Treating Jdyn Parkerson/Extender: [name], HOYT Weeks in Treatment: 3 Active Problems Location of Pain Severity and Description of Pain Patient Has Paino No Site Locations Pain Management and Medication Current Pain Management: Electronic Signature(s) Signed: 10/02/2018 5:07:23 PM By: Montey Hora Signed: 10/03/2018 3:31:07 PM By: Lorine Bears RCP, RRT, CHT Entered By: Lorine Bears on 10/02/2018 10:25:54 Wismer, [name] ([id number]) -------------------------------------------------------------------------------- Patient/Caregiver Education Details Patient Name: [name]. Date of Service: 10/02/2018 10:30 AM Medical Record Number: [id number] Patient Account Number: [id number] Date of Birth/Gender: 1940-11-20 (77 y.o. M) Treating RN: Harold Barban Primary Care Physician: Tedra Senegal Other Clinician: Referring Physician: Tedra Senegal Treating Physician/Extender: Sharalyn Ink in Treatment: 3 Education Assessment Education Provided To: Patient Education Topics Provided Electronic Signature(s) Signed: 10/02/2018 2:41:13 PM By: Harold Barban Entered By: Harold Barban on 10/02/2018 10:37:26 [name], [name] ([id number]) -------------------------------------------------------------------------------- Wound Assessment Details Patient Name: [name]. Date of Service: 10/02/2018 10:30 AM Medical Record Number: [id number] Patient Account Number: [id number] Date of Birth/Sex: 1941-03-22 (77 y.o. M) Treating RN: Montey Hora Primary Care Sakiyah Shur: Tedra Senegal Other Clinician:  Referring Keagen Heinlen: Tedra [place]egal Treating Darshay Deupree/Extender: [name], HOYT Weeks in Treatment: 3 Wound Status Wound Number: 6 Primary Abscess Etiology: Wound Location: Right Abdomen -  Lower Quadrant Wound Open Wounding Event: Blister Status: Date Acquired: 08/28/2018 Comorbid Anemia, Hypertension, Peripheral Venous Weeks Of Treatment: 3 History: Disease, Type II Diabetes, End Stage Renal Clustered Wound: No Disease, Gout, Neuropathy Photos Photo Uploaded By: Montey Hora on 10/02/2018 12:06:49 Wound Measurements Length: (cm) 2.6 Width: (cm) 5.3 Depth: (cm) 0.1 Area: (cm) 10.823 Volume: (cm) 1.082 % Reduction in Area: 75.1% % Reduction in Volume: 75.1% Epithelialization: None Tunneling: No Undermining: No Wound Description Full Thickness Without Exposed Support Classification: Structures Wound Margin: Flat and Intact Exudate Medium Amount: Exudate Type: Purulent Exudate Color: yellow, brown, green Foul Odor After Cleansing: No Slough/Fibrino Yes Wound Bed Granulation Amount: Large (67-100%) Exposed Structure Granulation Quality: Red Fascia Exposed: No Necrotic Amount: Small (1-33%) Fat Layer (Subcutaneous Tissue) Exposed: Yes Necrotic Quality: Adherent Slough Tendon Exposed: No Muscle Exposed: No Joint Exposed: No Bone Exposed: No [name], [name] ([id number]) Periwound Skin Texture Texture Color No Abnormalities Noted: No No Abnormalities Noted: No Callus: No Atrophie Blanche: No Crepitus: No Cyanosis: No Excoriation: No Ecchymosis: No Induration: No Erythema: No Rash: No Hemosiderin Staining: No Scarring: Yes Mottled: No Pallor: No Moisture Rubor: No No Abnormalities Noted: No Dry / Scaly: No Temperature / Pain Maceration: No Temperature: No Abnormality Tenderness on Palpation: Yes Wound Preparation Ulcer Cleansing: Rinsed/Irrigated with Saline Topical Anesthetic Applied: Other: lidocaine 4%, Treatment Notes Wound #6 (Right Abdomen - Lower Quadrant) Notes Hydrofera Blue and bordered foam dressing Electronic Signature(s) Signed: 10/02/2018 5:07:23 PM By: Montey Hora Entered By: Montey Hora on 10/02/2018  10:43:38 [name], [name] ([id number]) -------------------------------------------------------------------------------- Vitals Details Patient Name: [name]. Date of Service: 10/02/2018 10:30 AM Medical Record Number: [id number] Patient Account Number: [id number] Date of Birth/Sex: 03-27-1941 (77 y.o. M) Treating RN: Montey Hora Primary Care Abby Tucholski: Tedra Senegal Other Clinician: Referring Meghann Landing: Tedra [place]egal Treating Keyri Salberg/Extender: [name], HOYT Weeks in Treatment: 3 Vital Signs Time Taken: 10:25 Temperature (F): 94.5 Height (in): 71 Pulse (bpm): 81 Weight (lbs): 146 Respiratory Rate (breaths/min): 16 Body Mass Index (BMI): 20.4 Blood Pressure (mmHg): 105/73 Reference Range: 80 - 120 mg / dl Notes Blood pressure taken on patient's left ankle. Electronic Signature(s) Signed: 10/03/2018 3:31:07 PM By: Lorine Bears RCP, RRT, CHT Entered By: Lorine Bears on 10/02/2018 10:38:33

## 2018-10-04 NOTE — Progress Notes (Signed)
[name], [name] ([id number]) Visit Report for 10/02/2018 Chief Complaint Document Details Patient Name: [name], [name]. Date of Service: 10/02/2018 10:30 AM Medical Record Number: [id number] Patient Account Number: [id number] Date of Birth/Sex: November 29, 1940 (77 y.o. M) Treating RN: Montey Hora Primary Care Provider: Tedra Senegal Other Clinician: Referring Provider: Tedra Senegal Treating Provider/Extender: [name], HOYT Weeks in Treatment: 3 Information Obtained from: Patient Chief Complaint Abdominal ulcers Electronic Signature(s) Signed: 10/02/2018 3:05:10 PM By: Worthy Keeler PA-C Entered By: Worthy Keeler on 10/02/2018 10:35:51 [name], [name] ([id number]) -------------------------------------------------------------------------------- HPI Details Patient Name: [name]. Date of Service: 10/02/2018 10:30 AM Medical Record Number: [id number] Patient Account Number: [id number] Date of Birth/Sex: 11/24/1940 (77 y.o. M) Treating RN: Montey Hora Primary Care Provider: Tedra Senegal Other Clinician: Referring Provider: Tedra Senegal Treating Provider/Extender: [name], HOYT Weeks in Treatment: 3 History of Present Illness HPI Description: 78 year old gentleman seen earlier this year in January for a abdominal wound is now back with the same problem which has recurred for about 2 weeks and he has been trying to apply some local antibiotic ointment and a Band-Aid there. Of note he has significant pressure in this area due to his beltline and may have put on some weight which results in his trousers being very tight around the waist. The else has changed in his HandP and his diabetes is pretty well controlled. most recent hemoglobin A1c on 12/06/2016 was 7.8%. he was reviewed by his endocrinologist Dr. Dwyane Dee who made appropriate modifications to his treatment plan. he was recently seen by neurology for mild cognitive impairment that was found to be normal for his age 32/21/18  patient appears to be doing well in regard to his abdominal wound area on evaluation today. He has been tolerating the dressing changes with the [facility] Dressing I'm pleased with how this has progressed and in fact the wound appears to be completely healed on evaluation today. There is definitely no evidence of infection. 03/29/17 Patient's wound on the abdominal region appears to be completely healed on evaluation today. This wound has been doing very well and I think the biggest thing is going to be keeping it from reactivation where it rubbed on his pants. 04/18/2017 -- he was recently discharged about 3 weeks ago and was using a binder which probably caused him abrasion on the area of the recently healed scar. 05/02/17 on evaluation today patient appears to be doing fairly well in regard to his abdominal wound at the site of the surgical scar. Unfortunately the scar tissue is very fragile and tends to reopen. He did get a small abdominal binder to try to pad the area in order to offload and prevent his pants from rubbing specifically at the site. With that being said he tells me that Dr. Con Memos was concerned that the binder might actually have rub the area causing a reopening. I did have a look at this today and unfortunately it appears that he was putting this over his pants which was making matters worse. 05/09/17 on evaluation today patient continues to do well with the [facility] Dressing. There is a slight skin tear inferior to the wound that we have been treating that may have been due to the dressing sticking slightly. This is minimal however. I did discuss with patient prior to removal of the dressing for dressing changes that they whet this in order to loosen it and not risk damaging in the field. Nonetheless it appears that they have been doing  very well with the dressing changes he had his wife took over the dressing changes as she states that he was doing it wrong. Overall  though I'm pleased with how things are progressing. No fevers, chills, nausea, or vomiting noted at this time. Readmission: 08/27/17 on evaluation today patient appears to be doing similarly to how he was doing prior to healing previously August 2018 when we previously discharged him. He has fortunately been doing well up until just a couple of weeks ago and he had some of the [facility] Dressing over so he began to treat this on his own. He states that this has gotten much smaller but he ran out of supplies and therefore made an appointment to come back in for evaluation. No fevers, chills, nausea, or vomiting noted at this time. He has no discomfort at this point in regard to the wound. He does tell me that he has one maybe two compression fractures in his lower back and that he is going to be seen by the physician at [facility] imaging, Dr. Estanislado Pandy. ====== Old notes The 78 year old gentleman who has a past medical history of diabetes mellitus, end-stage renal disease on dialysis, hypertension, secondary hyperparathyroidism, peripheral vascular disease and history of previous arterial bifemoral bypass graft in 1992 also has a history of gout, hyperlipidemia, hypothyroidism. last hemoglobin A1c was 7.5%. status post AV fistula [name], [name]. ([id number]) placement, breast surgery for left granulomatous mastitis, triple a repair in 1992, small bowel obstruction, upper arm angiograms for AV fistula on the left side. was noted to have a superficial skin wound in the periumbilical area since late December 2017. He was asked to clean it with hydrogen peroxide and apply Bactroban. He was seen by vascular surgery in October 2017 where his right ABI was 0.56 left ABI was 0.87 and right TBI was 0.69 and left TBI was 0.70 ===== 09/25/16; patient was previously a patient of Dr. Ardeen Garland who has recurrent skin breakdown in the area of the large incision that was initially 4 apparently an open AAA  aneurysm repair. He had secondary surgery but I'm not sure of what etiology this was. He is been using Hydrofera Blue to the open wounds in the mid abdominal surgical incision. This is now closed over. I think he also uses this area as his waist for his pants and I've asked him to adjust this Readmission: 02/11/18 on evaluation today patient presents for evaluation concerning a sacral pressure ulcer which has been present for several weeks. He states that he actually had several areas that were open there appears just be one remaining. He has been tolerating the dressing changes is wise been using Hydrofera Blue Dressing as left over from his abdominal ulcer that we have previously treated. Fortunately there does not appear to be any evidence of infection which is good news. With that being said he states that he's been having issues at one point even at dialysis he had to discontinue dialysis and go home due to the pain. He has not been using any cushion to sit on although he states he does have one that he can use. No fevers, chills, nausea, or vomiting noted at this time. He had a significant issue with diarrhea prior to this occurring and I believe this may have had some contributing factor as well. 02/18/18 on evaluation today patient's wound actually appears to be doing about the same. Unfortunately he really has not noted any significant improvement over last week he still is  very tender even just to light touch unfortunately over the periwound region. There does not appear to be any evidence of infection which is good news. He is especially tender at the 12 o'clock location even a full fingers with away from the actual wound site. There still slough covering the surface of the wound. 03/04/18 on evaluation today patient's wound actually appears to show signs of great improvement compared to last week's evaluation. He is definitely showing improvement week by week which I think is great news.  Obviously that is what we are looking for. Nonetheless I did not even need to perform any sharp debridement today being that there was no slough noted on the surface of the wound. His blood pressure continues to remain low although this is something that is normal. That is for him. 03/18/18 on evaluation today patient's wound actually appears to potentially be completely healed although I cannot tell for sure. He has a small indeed very small eschar noted over the wound surface which also was measuring smaller than what it was previous. With that being said he has discomfort to the point that I'm not really able to do much with this in order to remove the eschar and prove that everything is completely healed underneath. Nonetheless it's starting to look like this may lift up on its own and I think that may be the best course for him currently. 04/01/18 on evaluation today patient appears to be doing very well in fact his wound is completely healed today. He's been tolerating the dressing changes without complication. With that being said I do believe at this point that he is ready for discharge she still having some discomfort deeper and he tells me this may just be some irritation from the long periods of time he has to sit for dialysis nonetheless he's not having the redness that was previously noted I think he has done very well trying to keep pressure off of this area. Readmission: 09/11/18 on evaluation today patient presents for reevaluation here in our clinic due to a remission with ulcers of the abdominal area. This is a region that he has previously been treated for bar clinic as well we were able to get this healed. 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 He states he was in the hospital when this just suddenly came up toward the end of his hospital stay. There really is nothing else currently going on indicating more  significant issue at this point. Fortunately No fevers, chills, nausea, or vomiting noted at this time. The patient does have diabetes, is on dialysis, and states that the area does tend to hurt but only near the bottom of the wound though this is rather painful. 10/02/18 on evaluation today patient's abdominal ulcer is actually appear to be doing excellent at this point. He's been tolerating Johnson, [name]. ([id number]) the dressing changes without complication. I do believe that this is doing very well for him. No fevers, chills, nausea, or vomiting noted at this time. Electronic Signature(s) Signed: 10/02/2018 3:05:10 PM By: Worthy Keeler PA-C Entered By: Worthy Keeler on 10/02/2018 15:02:50 Mccorry, [name] ([id number]) -------------------------------------------------------------------------------- Physical Exam Details Patient Name: [name]. Date of Service: 10/02/2018 10:30 AM Medical Record Number: [id number] Patient Account Number: [id number] Date of Birth/Sex: Aug 11, 1941 (77 y.o. M) Treating RN: Montey Hora Primary Care Provider: Tedra Senegal Other Clinician: Referring Provider: Tedra Senegal Treating Provider/Extender: [name], HOYT  Weeks in Treatment: 3 Constitutional Well-nourished and well-hydrated in no acute distress. Respiratory normal breathing without difficulty. clear to auscultation bilaterally. Cardiovascular regular rate and rhythm with normal S1, S2. Psychiatric this patient is able to make decisions and demonstrates good insight into disease process. Alert and Oriented x 3. pleasant and cooperative. Notes Patient's wound bed currently shows evidence of good granulation at this point. There did not appear to be any need for sharp debridement at this time which is excellent news. Overall I think he is progressing quite nicely. Electronic Signature(s) Signed: 10/02/2018 3:05:10 PM By: Worthy Keeler PA-C Entered By: Worthy Keeler on 10/02/2018  15:03:17 [name], [name] ([id number]) -------------------------------------------------------------------------------- Physician Orders Details Patient Name: [name]. Date of Service: 10/02/2018 10:30 AM Medical Record Number: [id number] Patient Account Number: [id number] Date of Birth/Sex: 09-Nov-1940 (77 y.o. 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 Anesthetic (add to Medication List) Wound #6 Right Abdomen - Lower Quadrant o Topical Lidocaine 4% cream applied to wound bed prior to debridement (In Clinic Only). Primary Wound Dressing Wound #6 Right Abdomen - Lower Quadrant o Hydrafera Blue Ready Transfer Secondary Dressing Wound #6 Right Abdomen - Lower Quadrant o Boardered Foam Dressing Dressing Change Frequency Wound #6 Right Abdomen - Lower Quadrant o Change dressing every day. o Change Dressing Monday, Wednesday, Friday Follow-up Appointments Wound #6 Right Abdomen - Lower Quadrant o Return Appointment in 1 week. Home Health Wound #6 Right Abdomen - [place] Visits o Home Health Nurse may visit PRN to address patientos wound care needs. o FACE TO FACE ENCOUNTER: MEDICARE and MEDICAID PATIENTS: I certify that this patient is under my care and that I had a face-to-face encounter that meets the physician face-to-face encounter requirements with this patient on this date. The encounter with the patient  was in whole or in part for the following MEDICAL CONDITION: (primary reason for [place]) MEDICAL NECESSITY: I certify, that based on my findings, NURSING services are a medically necessary home health service. HOME BOUND STATUS: I certify that my Ceniceros, [name] ([id number]) clinical findings support that this patient is homebound (i.e., Due to illness or injury, pt requires aid of supportive devices such as crutches, cane, wheelchairs, walkers, the use of special transportation or the assistance of another person to leave their place of residence. There is a normal inability to leave the home and doing so requires considerable and taxing effort. Other absences are for medical reasons / religious services and are infrequent or of short duration when for other reasons). o If current dressing causes regression in wound condition, may D/C ordered dressing product/s and apply Normal Saline Moist Dressing daily until next [place] / Other MD appointment. [place] of regression in wound condition at [phone]. o Please direct any NON-WOUND related issues/requests for orders to patient's Primary Care Physician Electronic Signature(s) Signed: 10/02/2018 2:41:13 PM By: Harold Barban Signed: 10/02/2018 3:05:10 PM By: Worthy Keeler PA-C Entered By: Harold Barban on 10/02/2018 10:52:10 Santillo, [name] ([id number]) --------------------------------------------------------------------------------  Problem List Details Patient Name: TATEColbin, Jovel. Date of Service: 10/02/2018 10:30 AM Medical Record Number: [id number] Patient Account Number: [id number] Date of Birth/Sex: 1941-06-15 (77 y.o. 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 Date of Service: 10/02/2018 10:30 AM Medical Record Number: [id number] Patient Account Number: [id number] Date of Birth/Sex: 02-27-1941 (77 y.o. M) Treating RN: Montey Hora Primary Care Provider: Tedra Senegal Other Clinician: Referring Provider: Tedra Senegal Treating Provider/Extender: [name], HOYT Weeks in Treatment: 3 Subjective Chief Complaint Information obtained from Patient Abdominal ulcers History of Present Illness (HPI) 78 year old gentleman seen earlier this year in January for a abdominal wound is now back with the same problem which has recurred for about 2 weeks and he has been trying to apply some local antibiotic ointment and a Band-Aid there. Of note he has significant pressure in this area due to his beltline and may have put on some weight which results in his trousers being very tight around the waist. The else has changed in his HandP and his diabetes is pretty well controlled. most recent hemoglobin A1c on 12/06/2016 was 7.8%. he was reviewed by his endocrinologist Dr. Dwyane Dee who made appropriate modifications to his treatment plan. he was recently seen by neurology for mild cognitive impairment that was found to be normal for his age 52/21/18 patient appears to be doing well in regard to his abdominal wound area on evaluation today. He has been  tolerating the dressing changes with the [facility] Dressing I'm pleased with how this has progressed and in fact the wound appears to be completely healed on evaluation today. There is definitely no evidence of infection. 03/29/17 Patient's wound on the abdominal region appears to be completely healed on evaluation today. This wound has been doing very well and I think the biggest thing is going to be keeping it from reactivation where it rubbed on his pants. 04/18/2017 -- he was recently discharged about 3 weeks ago and was using a binder which probably caused him abrasion on the area of the recently healed scar. 05/02/17 on evaluation today patient appears to be doing fairly well in regard to his abdominal wound at the site of the surgical scar. Unfortunately the scar tissue is very fragile and tends to reopen. He did get a small abdominal binder to try to pad the area in order to offload and prevent his pants from rubbing specifically at the site. With that being said he tells me that Dr. Con Memos was concerned that the binder might actually have rub the area causing a reopening. I did have a look at this today and unfortunately it appears that he was putting this over his pants which was making matters worse. 05/09/17 on evaluation today patient continues to do well with the [facility] Dressing. There is a slight skin tear inferior to the wound that we have been  treating that may have been due to the dressing sticking slightly. This is minimal however. I did discuss with patient prior to removal of the dressing for dressing changes that they whet this in order to loosen it and not risk damaging in the field. Nonetheless it appears that they have been doing very well with the dressing changes he had his wife took over the dressing changes as she states that he was doing it wrong. Overall though I'm pleased with how things are progressing. No fevers, chills, nausea, or vomiting noted at this  time. Readmission: 08/27/17 on evaluation today patient appears to be doing similarly to how he was doing prior to healing previously August 2018 when we previously discharged him. He has fortunately been doing well up until just a couple of weeks ago and he had some of the [facility] Dressing over so he began to treat this on his own. He states that this has gotten much smaller but he ran out of supplies and therefore made an appointment to come back in for evaluation. No fevers, chills, nausea, or vomiting noted at this time. He has no discomfort at this point in regard to the wound. He does tell me that he has one maybe two compression fractures in his lower back and that he is going to be seen by the physician at [facility] imaging, Dr. Estanislado Pandy. [place]assfield, [name] ([id number]) ====== Old notes The 78 year old gentleman who has a past medical history of diabetes mellitus, end-stage renal disease on dialysis, hypertension, secondary hyperparathyroidism, peripheral vascular disease and history of previous arterial bifemoral bypass graft in 1992 also has a history of gout, hyperlipidemia, hypothyroidism. last hemoglobin A1c was 7.5%. status post AV fistula placement, breast surgery for left granulomatous mastitis, triple a repair in 1992, small bowel obstruction, upper arm angiograms for AV fistula on the left side. was noted to have a superficial skin wound in the periumbilical area since late December 2017. He was asked to clean it with hydrogen peroxide and apply Bactroban. He was seen by vascular surgery in October 2017 where his right ABI was 0.56 left ABI was 0.87 and right TBI was 0.69 and left TBI was 0.70 ===== 09/25/16; patient was previously a patient of Dr. Ardeen Garland who has recurrent skin breakdown in the area of the large incision that was initially 4 apparently an open AAA aneurysm repair. He had secondary surgery but I'm not sure of what etiology this was. He is been using  Hydrofera Blue to the open wounds in the mid abdominal surgical incision. This is now closed over. I think he also uses this area as his waist for his pants and I've asked him to adjust this Readmission: 02/11/18 on evaluation today patient presents for evaluation concerning a sacral pressure ulcer which has been present for several weeks. He states that he actually had several areas that were open there appears just be one remaining. He has been tolerating the dressing changes is wise been using Hydrofera Blue Dressing as left over from his abdominal ulcer that we have previously treated. Fortunately there does not appear to be any evidence of infection which is good news. With that being said he states that he's been having issues at one point even at dialysis he had to discontinue dialysis and go home due to the pain. He has not been using any cushion to sit on although he states he does have one that he can use. No fevers, chills, nausea, or vomiting noted at this  time. He had a significant issue with diarrhea prior to this occurring and I believe this may have had some contributing factor as well. 02/18/18 on evaluation today patient's wound actually appears to be doing about the same. Unfortunately he really has not noted any significant improvement over last week he still is very tender even just to light touch unfortunately over the periwound region. There does not appear to be any evidence of infection which is good news. He is especially tender at the 12 o'clock location even a full fingers with away from the actual wound site. There still slough covering the surface of the wound. 03/04/18 on evaluation today patient's wound actually appears to show signs of great improvement compared to last week's evaluation. He is definitely showing improvement week by week which I think is great news. Obviously that is what we are looking for. Nonetheless I did not even need to perform any sharp  debridement today being that there was no slough noted on the surface of the wound. His blood pressure continues to remain low although this is something that is normal. That is for him. 03/18/18 on evaluation today patient's wound actually appears to potentially be completely healed although I cannot tell for sure. He has a small indeed very small eschar noted over the wound surface which also was measuring smaller than what it was previous. With that being said he has discomfort to the point that I'm not really able to do much with this in order to remove the eschar and prove that everything is completely healed underneath. Nonetheless it's starting to look like this may lift up on its own and I think that may be the best course for him currently. 04/01/18 on evaluation today patient appears to be doing very well in fact his wound is completely healed today. He's been tolerating the dressing changes without complication. With that being said I do believe at this point that he is ready for discharge she still having some discomfort deeper and he tells me this may just be some irritation from the long periods of time he has to sit for dialysis nonetheless he's not having the redness that was previously noted I think he has done very well trying to keep pressure off of this area. Readmission: 09/11/18 on evaluation today patient presents for reevaluation here in our clinic due to a remission with ulcers of the abdominal area. This is a region that he has previously been treated for bar clinic as well we were able to get this healed. 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 He states he was in the hospital when this just suddenly came up toward the end of his hospital stay. There really is nothing else currently going on indicating more significant issue at this point. Fortunately No fevers, chills, nausea, or vomiting noted at this  time. Golding, [name] ([id number]) The patient does have diabetes, is on dialysis, and states that the area does tend to hurt but only near the bottom of the wound though this is rather painful. 10/02/18 on evaluation today patient's abdominal ulcer is actually appear to be doing excellent at this point. He's been tolerating the dressing changes without complication. I do believe that this is doing very well for him. No fevers, chills, nausea, or vomiting noted at this time. Patient History Information obtained from Patient. Family History Diabetes - Father,Paternal Grandparents, Heart Disease - Father, Hypertension - Father, No  family history of Cancer, Hereditary Spherocytosis, Kidney Disease, Lung Disease, Seizures, Stroke, Thyroid Problems, Tuberculosis. Social History Former smoker - quit in 1996, Marital Status - Married, Alcohol Use - Never - quit in 1976, Drug Use - No History, Caffeine Use - Never. 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 Respiratory The patient has no complaints or symptoms. Cardiovascular The patient has no complaints or symptoms. Psychiatric The patient has no complaints or symptoms. Objective Constitutional Well-nourished and well-hydrated in no acute distress. Vitals Time Taken: 10:25 AM, Height: 71 in, Weight: 146 lbs, BMI: 20.4, Temperature: 94.5 F, Pulse: 81 bpm, Respiratory Rate: 16 breaths/min, Blood Pressure: 105/73 mmHg. General Notes: Blood pressure taken on patient's left ankle. Respiratory normal breathing without difficulty. clear to auscultation bilaterally. Cardiovascular regular rate and rhythm with normal S1, S2. Crombie, [name] ([id number]) Psychiatric this patient is able to make decisions and demonstrates good insight into  disease process. Alert and Oriented x 3. pleasant and cooperative. General Notes: Patient's wound bed currently shows evidence of good granulation at this point. There did not appear to be any need for sharp debridement at this time which is excellent news. Overall I think he is progressing quite nicely. Integumentary (Hair, Skin) Wound #6 status is Open. Original cause of wound was Blister. The wound is located on the Right Abdomen - Lower Quadrant. The wound measures 2.6cm length x 5.3cm width x 0.1cm depth; 10.823cm^2 area and 1.082cm^3 volume. There is Fat Layer (Subcutaneous Tissue) Exposed exposed. There is no tunneling or undermining noted. There is a medium amount of purulent drainage noted. The wound margin is flat and intact. There is large (67-100%) red granulation within the wound bed. There is a small (1-33%) amount of necrotic tissue within the wound bed including Adherent Slough. The periwound skin appearance exhibited: Scarring. The periwound skin appearance did not exhibit: Callus, Crepitus, Excoriation, Induration, Rash, Dry/Scaly, Maceration, Atrophie Blanche, Cyanosis, Ecchymosis, Hemosiderin Staining, Mottled, Pallor, Rubor, Erythema. Periwound temperature was noted as No Abnormality. The periwound has tenderness on palpation. 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 Anesthetic (add to Medication List): Wound #6 Right Abdomen - Lower Quadrant: Topical Lidocaine 4% cream applied to wound bed prior to debridement (In Clinic Only). Primary Wound Dressing: Wound #6 Right Abdomen - Lower Quadrant: Hydrafera Blue Ready Transfer Secondary Dressing: Wound #6 Right Abdomen - Lower Quadrant: Boardered  Foam Dressing Dressing Change Frequency: Wound #6 Right Abdomen - Lower Quadrant: Change dressing every day. Change Dressing Monday, Wednesday, Friday Follow-up Appointments: Wound #6 Right Abdomen - Lower Quadrant: Return Appointment in 1 week. [name], [name] ([id number]) Home Health: Wound #6 Right Abdomen - Lower Quadrant: [place] Nurse may visit PRN to address patient s wound care needs. FACE TO FACE ENCOUNTER: MEDICARE and MEDICAID PATIENTS: I certify that this patient is under my care and that I had a face-to-face encounter that meets the physician face-to-face encounter requirements with this patient on this date. The encounter with the patient was in whole or in part for the following MEDICAL CONDITION: (primary reason for [place]) MEDICAL NECESSITY: I certify, that based on my  findings, NURSING services are a medically necessary home health service. HOME BOUND STATUS: I certify that my clinical findings support that this patient is homebound (i.e., Due to illness or injury, pt requires aid of supportive devices such as crutches, cane, wheelchairs, walkers, the use of special transportation or the assistance of another person to leave their place of residence. There is a normal inability to leave the home and doing so requires considerable and taxing effort. Other absences are for medical reasons / religious services and are infrequent or of short duration when for other reasons). If current dressing causes regression in wound condition, may D/C ordered dressing product/s and apply Normal Saline Moist Dressing daily until next Home / Other MD appointment. [place] of regression in wound condition at [phone]. Please direct any NON-WOUND related issues/requests for orders to patient's Primary Care Physician I'm going to recommend at this point that we continue with the above wound care measures for the  next week. The patient is in agreement with that plan. If anything changes or worsens meantime he'll let me know. I did advise him that he continue to carry his pillow to sit on to his dialysis appointment. He tells me that he hasn'[name] been doing that I think it would be good for him to say that he does not end up with another wound on his bottom. He understands. We will see were things stand at follow- up. Please see above for specific wound care orders. We will see patient for re-evaluation in 1 week(s) here in the clinic. If anything worsens or changes patient will contact our office for additional recommendations. Electronic Signature(s) Signed: 10/02/2018 3:05:10 PM By: Worthy Keeler PA-C Entered By: Worthy Keeler on 10/02/2018 15:03:56 [name], [name] ([id number]) -------------------------------------------------------------------------------- ROS/PFSH Details Patient Name: [name]. Date of Service: 10/02/2018 10:30 AM Medical Record Number: [id number] Patient Account Number: [id number] Date of Birth/Sex: 1941/01/05 (77 y.o. M) Treating RN: Montey Hora Primary Care Provider: Tedra Senegal Other Clinician: Referring Provider: Tedra Senegal Treating Provider/Extender: [name], HOYT Weeks in Treatment: 3 Information Obtained From Patient Wound History Do you currently have one or more open woundso Yes How many open wounds do you currently haveo 2 Approximately how long have you had your woundso 2 weeks How have you been treating your wound(s) until nowo zinc ,drsg Has your wound(s) ever healed and then re-openedo No Have you had any lab work done in the past montho Yes Who ordered the lab work doneo [facility] doctor [place] Have you tested positive for an antibiotic resistant organism (MRSA, VRE)o No Have you tested positive for osteomyelitis (bone infection)o No Have you had any tests for circulation on your legso No Constitutional Symptoms (General Health) Complaints and  Symptoms: Negative for: Fever; Chills Eyes Medical History: Negative for: Cataracts; Glaucoma; Optic Neuritis Ear/Nose/Mouth/Throat Medical History: Negative for: Chronic sinus problems/congestion; Middle ear problems Hematologic/Lymphatic Medical History: Positive for: Anemia Negative for: Hemophilia; Human Immunodeficiency Virus; Lymphedema; Sickle Cell Disease Respiratory Complaints and Symptoms: No Complaints or Symptoms Cardiovascular Complaints and Symptoms: No Complaints or Symptoms Medical History: Positive for: Hypertension; Peripheral Venous Disease Negative for: Angina; Arrhythmia; Congestive Heart Failure; Coronary Artery Disease; Deep Vein Thrombosis; Hypotension; Myocardial Infarction; Peripheral Arterial Disease; Phlebitis; Vasculitis [place], [name] ([id number]) Past Medical History Notes: artificial valve, hyperlipidemia Gastrointestinal Medical History: Negative for: Cirrhosis ; Colitis; Crohnos; Hepatitis A; Hepatitis B; Hepatitis C Past Medical History Notes: scar tissue from multiple surgeries Endocrine Medical History: Positive for:  Type II Diabetes Negative for: Type I Diabetes Treated with: Oral agents Genitourinary Medical History: 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 Immunological Medical History: Negative for: Lupus Erythematosus; Raynaudos; Scleroderma Integumentary (Skin) Medical History: Negative for: History of Burn; History of pressure wounds Musculoskeletal Medical History: Positive for: Gout Negative for: Rheumatoid Arthritis; Osteoarthritis; Osteomyelitis Neurologic Medical History: Positive for: Neuropathy Negative for: Dementia; Quadriplegia; Paraplegia; Seizure Disorder Oncologic Medical History: Negative for: Received Chemotherapy; Received Radiation Psychiatric Complaints and Symptoms: No Complaints or Symptoms Medical History: Negative for: Anorexia/bulimia; Confinement Anxiety Immunizations Rakers,  [name] ([id number]) Pneumococcal Vaccine: Received Pneumococcal Vaccination: Yes Immunization Notes: uo to date Implantable Devices Family and Social History Cancer: No; Diabetes: Yes - Father,Paternal Grandparents; Heart Disease: Yes - Father; Hereditary Spherocytosis: No; Hypertension: Yes - Father; Kidney Disease: No; Lung Disease: No; Seizures: No; Stroke: No; Thyroid Problems: No; Tuberculosis: No; Former smoker - quit in 1996; Marital Status - Married; Alcohol Use: Never - quit in 1976; Drug Use: No History; Caffeine Use: Never; Financial Concerns: No; Food, Clothing or Shelter Needs: No; Support System Lacking: No; Transportation Concerns: No; Advanced Directives: No; Patient does not want information on Advanced Directives Physician Affirmation I have reviewed and agree with the above information. Electronic Signature(s) Signed: 10/02/2018 3:05:10 PM By: Worthy Keeler PA-C Signed: 10/02/2018 5:07:23 PM By: Montey Hora Entered By: Worthy Keeler on 10/02/2018 15:03:03 Devora, [name] ([id number]) -------------------------------------------------------------------------------- SuperBill Details Patient Name: [name]. Date of Service: 10/02/2018 Medical Record Number: [id number] Patient Account Number: [id number] Date of Birth/Sex: August 11, 1941 (78 y.o. M) Treating RN: Montey Hora Primary Care Provider: Tedra Senegal Other Clinician: Referring Provider: Tedra Senegal Treating Provider/Extender: [name], HOYT Weeks in Treatment: 3 Diagnosis Coding ICD-10 Codes Code Description E83.59 Other disorders of calcium metabolism L98.492 Non-pressure chronic ulcer of skin of other sites with fat layer exposed E11.622 Type 2 diabetes mellitus with other skin ulcer Z99.2 Dependence on renal dialysis Facility Procedures CPT4 Code: 34193790 Description: 99213 - WOUND CARE VISIT-LEV 3 EST PT Modifier: Quantity: 1 Physician Procedures CPT4 Code: 2409735 Description: 32992 -  WC PHYS LEVEL 4 - EST PT ICD-10 Diagnosis Description E83.59 Other disorders of calcium metabolism L98.492 Non-pressure chronic ulcer of skin of other sites with fat l E11.622 Type 2 diabetes mellitus with other skin ulcer Z99.2  Dependence on renal dialysis Modifier: ayer exposed Quantity: 1 Electronic Signature(s) Signed: 10/02/2018 3:05:10 PM By: Worthy Keeler PA-C Entered By: Worthy Keeler on 10/02/2018 15:04:24

## 2018-10-06 ENCOUNTER — Other Ambulatory Visit: Payer: Medicare Other

## 2018-10-06 DIAGNOSIS — E1129 Type 2 diabetes mellitus with other diabetic kidney complication: Secondary | ICD-10-CM | POA: Diagnosis not present

## 2018-10-06 DIAGNOSIS — D631 Anemia in chronic kidney disease: Secondary | ICD-10-CM | POA: Diagnosis not present

## 2018-10-06 DIAGNOSIS — N186 End stage renal disease: Secondary | ICD-10-CM | POA: Diagnosis not present

## 2018-10-06 DIAGNOSIS — D509 Iron deficiency anemia, unspecified: Secondary | ICD-10-CM | POA: Diagnosis not present

## 2018-10-06 DIAGNOSIS — N2581 Secondary hyperparathyroidism of renal origin: Secondary | ICD-10-CM | POA: Diagnosis not present

## 2018-10-07 ENCOUNTER — Ambulatory Visit
Admission: RE | Admit: 2018-10-07 | Discharge: 2018-10-07 | Disposition: A | Discharge status: Home / Self Care | Payer: Medicare Other | Source: Ambulatory Visit | Attending: Neurological Surgery | Admitting: Neurological Surgery

## 2018-10-07 DIAGNOSIS — S065XAA Traumatic subdural hemorrhage with loss of consciousness status unknown, initial encounter: Secondary | ICD-10-CM

## 2018-10-07 DIAGNOSIS — I6200 Nontraumatic subdural hemorrhage, unspecified: Secondary | ICD-10-CM | POA: Diagnosis not present

## 2018-10-07 DIAGNOSIS — S065X9A Traumatic subdural hemorrhage with loss of consciousness of unspecified duration, initial encounter: Secondary | ICD-10-CM

## 2018-10-08 DIAGNOSIS — N2581 Secondary hyperparathyroidism of renal origin: Secondary | ICD-10-CM | POA: Diagnosis not present

## 2018-10-08 DIAGNOSIS — N186 End stage renal disease: Secondary | ICD-10-CM | POA: Diagnosis not present

## 2018-10-08 DIAGNOSIS — D631 Anemia in chronic kidney disease: Secondary | ICD-10-CM | POA: Diagnosis not present

## 2018-10-08 DIAGNOSIS — E1129 Type 2 diabetes mellitus with other diabetic kidney complication: Secondary | ICD-10-CM | POA: Diagnosis not present

## 2018-10-08 DIAGNOSIS — D509 Iron deficiency anemia, unspecified: Secondary | ICD-10-CM | POA: Diagnosis not present

## 2018-10-09 ENCOUNTER — Encounter: Payer: Medicare Other | Attending: Physical Medicine & Rehabilitation | Admitting: Physical Medicine & Rehabilitation

## 2018-10-09 ENCOUNTER — Encounter: Payer: Medicare Other | Admitting: Physician Assistant

## 2018-10-09 ENCOUNTER — Encounter: Payer: Self-pay | Admitting: Physical Medicine & Rehabilitation

## 2018-10-09 VITALS — BP 119/88 | HR 86 | Ht 71.0 in | Wt 140.0 lb

## 2018-10-09 DIAGNOSIS — S065X9A Traumatic subdural hemorrhage with loss of consciousness of unspecified duration, initial encounter: Secondary | ICD-10-CM

## 2018-10-09 DIAGNOSIS — I2510 Atherosclerotic heart disease of native coronary artery without angina pectoris: Secondary | ICD-10-CM | POA: Insufficient documentation

## 2018-10-09 DIAGNOSIS — S069X0A Unspecified intracranial injury without loss of consciousness, initial encounter: Secondary | ICD-10-CM | POA: Diagnosis not present

## 2018-10-09 DIAGNOSIS — G4730 Sleep apnea, unspecified: Secondary | ICD-10-CM | POA: Diagnosis not present

## 2018-10-09 DIAGNOSIS — N186 End stage renal disease: Secondary | ICD-10-CM | POA: Diagnosis not present

## 2018-10-09 DIAGNOSIS — E1151 Type 2 diabetes mellitus with diabetic peripheral angiopathy without gangrene: Secondary | ICD-10-CM | POA: Insufficient documentation

## 2018-10-09 DIAGNOSIS — E1122 Type 2 diabetes mellitus with diabetic chronic kidney disease: Secondary | ICD-10-CM | POA: Diagnosis not present

## 2018-10-09 DIAGNOSIS — F0390 Unspecified dementia without behavioral disturbance: Secondary | ICD-10-CM | POA: Diagnosis not present

## 2018-10-09 DIAGNOSIS — M545 Low back pain, unspecified: Secondary | ICD-10-CM

## 2018-10-09 DIAGNOSIS — Z87891 Personal history of nicotine dependence: Secondary | ICD-10-CM | POA: Insufficient documentation

## 2018-10-09 DIAGNOSIS — I120 Hypertensive chronic kidney disease with stage 5 chronic kidney disease or end stage renal disease: Secondary | ICD-10-CM | POA: Diagnosis not present

## 2018-10-09 DIAGNOSIS — E119 Type 2 diabetes mellitus without complications: Secondary | ICD-10-CM

## 2018-10-09 DIAGNOSIS — Z8249 Family history of ischemic heart disease and other diseases of the circulatory system: Secondary | ICD-10-CM | POA: Diagnosis not present

## 2018-10-09 DIAGNOSIS — L98492 Non-pressure chronic ulcer of skin of other sites with fat layer exposed: Secondary | ICD-10-CM | POA: Diagnosis not present

## 2018-10-09 DIAGNOSIS — Z833 Family history of diabetes mellitus: Secondary | ICD-10-CM | POA: Diagnosis not present

## 2018-10-09 DIAGNOSIS — E11622 Type 2 diabetes mellitus with other skin ulcer: Secondary | ICD-10-CM | POA: Diagnosis not present

## 2018-10-09 DIAGNOSIS — S065XAA Traumatic subdural hemorrhage with loss of consciousness status unknown, initial encounter: Secondary | ICD-10-CM

## 2018-10-09 DIAGNOSIS — Z992 Dependence on renal dialysis: Secondary | ICD-10-CM | POA: Insufficient documentation

## 2018-10-09 DIAGNOSIS — R269 Unspecified abnormalities of gait and mobility: Secondary | ICD-10-CM | POA: Diagnosis not present

## 2018-10-09 NOTE — Progress Notes (Addendum)
Subjective:    Patient ID: [name], male    DOB: 13-Feb-1941, 78 y.o.   MRN: [id number]  HPI: [name] is a 78 y.o. male with history of ESRD-HD MWF, T2DM, recent GI bleed due to AVM, chronic hypertension, dementia, OSA presents for follow up for b/l SDH.  Wife supplements history. Last clinic visit 08/26/18, seen by NP, notes reviewed. Since that time, he denies issues with HD. CBGs have been relatively controlled. His bowels have returned to baseline. BP is relatively controlled. He sees Neurology next month.  He saw Neurosurg and repeat head CT ordered. He is still in PT. He is following up with wound care for abdominal wound.   Pain Inventory Average Pain 6 Pain Right Now 9 My pain is aching  In the last 24 hours, has pain interfered with the following? General activity 7 Relation with others 0 Enjoyment of life 8 What TIME of day is your pain at its worst? morning Sleep (in general) Fair  Pain is worse with: walking, bending, standing and some activites Pain improves with: rest, heat/ice and medication Relief from Meds: 6  Mobility walk with assistance use a walker how many minutes can you walk? 3 ability to climb steps?  yes do you drive?  no  Function disabled: date disabled 2006 I need assistance with the following:  dressing, bathing, household duties and shopping  Neuro/Psych bowel control problems weakness trouble walking dizziness confusion  Prior Studies Any changes since last visit?  no  Physicians involved in your care Any changes since last visit?  no   Family History  Problem Relation Age of Onset  . Aneurysm Mother   . Heart disease Father   . Stroke Father   . Hypertension Father   . Diabetes Father   . Dementia Neg Hx   . Colon cancer Neg Hx   . Esophageal cancer Neg Hx   . Pancreatic cancer Neg Hx   . Prostate cancer Neg Hx   . Rectal cancer Neg Hx   . Stomach cancer Neg Hx    Social History   Socioeconomic History    . Marital status: Married    Spouse name: [name] Mood  . Number of children: 1  . Years of education: 71  . Highest education level: Not on file  Occupational History  . Occupation: Retired  Scientific laboratory technician  . Financial resource strain: Not on file  . Food insecurity:    Worry: Not on file    Inability: Not on file  . Transportation needs:    Medical: No    Non-medical: No  Tobacco Use  . Smoking status: Former Smoker    Last attempt to quit: 09/24/1994    Years since quitting: 24.0  . Smokeless tobacco: Never Used  Substance and Sexual Activity  . Alcohol use: No    Alcohol/week: 0.0 standard drinks    Comment: Quit Oct. 1977 ("somewhat heavy")  . Drug use: No  . Sexual activity: Not on file  Lifestyle  . Physical activity:    Days per week: 3 days    Minutes per session: 10 min  . Stress: Not at all  Relationships  . Social connections:    Talks on phone: Not on file    Gets together: Not on file    Attends religious service: Not on file    Active member of club or organization: Not on file    Attends meetings of clubs or organizations: Not on file  Relationship status: Not on file  Other Topics Concern  . Not on file  Social History Narrative   Lives at home with wife.   Caffeine use: Drinks no soda or tea.    Drinks 1 cup coffee/week   Right handed   Past Surgical History:  Procedure Laterality Date  . aortobifemoral bypass    . AV FISTULA PLACEMENT Left 12/01/2013   Procedure: ARTERIOVENOUS (AV) FISTULA CREATION- LEFT BRACHIOCEPHALIC;  Surgeon: Angelia Mould, MD;  Location: [facility];  Service: Vascular;  Laterality: Left;  . [place] TRANSPOSITION Right 07/27/2014   Procedure: BASCILIC VEIN TRANSPOSITION;  Surgeon: Angelia Mould, MD;  Location: [facility];  Service: Vascular;  Laterality: Right;  . BREAST SURGERY     left - granulomatous mastitis  . BURR HOLE Bilateral 07/22/2018   Procedure: BILATERAL BURR HOLES;  Surgeon: Kristeen Miss, MD;   Location: [facility];  Service: Neurosurgery;  Laterality: Bilateral;  . COLONOSCOPY    . 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 ENTEROSCOPY N/A 06/03/2018   Procedure: ENTEROSCOPY;  Surgeon: Lavena Bullion, DO;  Location: [facility];  Service: Gastroenterology;  Laterality: N/A;  . EYE SURGERY Bilateral    cataracts  . HEMODIALYSIS INPATIENT  01/17/2018      . HOT HEMOSTASIS N/A 06/03/2018   Procedure: HOT HEMOSTASIS (ARGON PLASMA COAGULATION/BICAP);  Surgeon: Lavena Bullion, DO;  Location[facility] ENDOSCOPY;  Service: Gastroenterology;  Laterality: N/A;  . REVISON OF ARTERIOVENOUS FISTULA Left 02/09/2014   Procedure: REVISON OF LEFT ARTERIOVENOUS FISTULA - RESECTION OF RENDUNDANT VEIN;  Surgeon: Angelia Mould, MD;  Location: [facility];  Service: Vascular;  Laterality: Left;  . SBO with lysis adhesions    . SHUNTOGRAM Left 04/19/2014   Procedure: FISTULOGRAM;  Surgeon: Angelia Mould, MD;  Location: [facility] CATH LAB;  Service: Cardiovascular;  Laterality: Left;  . UNILATERAL UPPER EXTREMEITY ANGIOGRAM N/A 07/12/2014   Procedure: UNILATERAL UPPER Anselmo Rod;  Surgeon: Angelia Mould, MD;  Location: [facility] C[facility] LAB;  Service: Cardiovascular;  Laterality: N/A;   Past Medical History:  Diagnosis Date  . Allergy   . Anemia   . Arthritis   . Cataract    bil cateracts removed  . Coronary artery disease   . Dementia arising in the senium and presenium ([place])   . Diabetes mellitus    Type 2  . Diverticulitis   . ED (erectile dysfunction)   . 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 ESRD (end stage renal disease) on dialysis ([place]) 03/2015  . GERD (gastroesophageal reflux disease)    pepto   . Gout   . Hyperlipidemia   . Hypertension   . Hypothyroidism   . Pneumonia   . PVD (peripheral vascular disease) ([place])    has plastic aorta  . Renal insufficiency   . Seasonal allergies   . Shortness of breath dyspnea   . Sleep apnea    does not wear c-pap  . Thyroid  disease    BP 119/88   Pulse 86   Ht 5\' 11"  (1.803 m)   Wt 140 lb (63.5 kg)   SpO2 96%   BMI 19.53 kg/m   Opioid Risk Score:   Fall Risk Score:  `1  Depression screen PHQ 2/9  Depression screen [facility] 2/9 10/09/2018 08/26/2018 08/29/2017  Decreased Interest 1 1 0  Down, Depressed, Hopeless 1 1 0  PHQ - 2 Score 2 2 0  Altered sleeping - 2 -  Tired, decreased energy - 3 -  Change in appetite - 3 -  Feeling bad or failure about yourself  - 2 -  Trouble concentrating - 1 -  Moving slowly or fidgety/restless - 0 -  Suicidal thoughts - 1 -  PHQ-9 Score - 14 -  Difficult doing work/chores - Somewhat difficult -  Some recent data might be hidden     Review of Systems  Constitutional: Positive for appetite change and unexpected weight change.  HENT: Negative.   Eyes: Negative.   Respiratory: Negative.   Cardiovascular: Negative.   Gastrointestinal: Positive for constipation and diarrhea.  Endocrine: Negative.   Genitourinary: Negative.   Musculoskeletal: Positive for arthralgias, back pain and gait problem.  Skin: Positive for rash.  Allergic/Immunologic: Negative.   Neurological: Positive for dizziness and weakness.  Hematological: Negative.   Psychiatric/Behavioral: Positive for dysphoric mood. The patient is nervous/anxious.   All other systems reviewed and are negative.      Objective:    Constitutional: Well-developed. NAD  HENT: Bilateral crani incisions healing Eyes: EOMI. No discharge.   Cardiovascular: RRR. No JVD. Respiratory: Effort normal. Clear. GI: He exhibits no distension. Bowel sounds normal.  Musculoskeletal: He exhibits no edema and tenderness.  Neurological: Alert and Oriented x3, except for date of month.  Able to follow simple one step motor commands.  Motor: B/l UE 5/5 proximal to distal B/l LE: HF 4+-5/5, KE 4+-5/5, ADF 5/5 HOH Skin: Warm and dry.     Assessment & Plan:  78 y/o male with history of ESRD-HD MWF, T2DM, recent GI bleed due to  AVM, chronic hypertension, dementia, OSA presents for follow up for b/l SDH.  1.  Functional and cognitive deficits secondary to bi-frontal-temporal SDH's status post bur holes on 10/29.              Continue therapies             Repeat CT reviewed, improved  2. ESRD:   Cont recs per Nephro  3 T2DM:   Cont meds  Controlled at present per wife  4. H/O IBS:   Back to baseline  Cont meds  5. Gait abnormality  Cont rollator in community, cane for short distances  6. Back pain  Recommend OTC Lidoderm patch qhs  Local measures with heat

## 2018-10-10 DIAGNOSIS — N2581 Secondary hyperparathyroidism of renal origin: Secondary | ICD-10-CM | POA: Diagnosis not present

## 2018-10-10 DIAGNOSIS — D509 Iron deficiency anemia, unspecified: Secondary | ICD-10-CM | POA: Diagnosis not present

## 2018-10-10 DIAGNOSIS — N186 End stage renal disease: Secondary | ICD-10-CM | POA: Diagnosis not present

## 2018-10-10 DIAGNOSIS — D631 Anemia in chronic kidney disease: Secondary | ICD-10-CM | POA: Diagnosis not present

## 2018-10-10 DIAGNOSIS — E1129 Type 2 diabetes mellitus with other diabetic kidney complication: Secondary | ICD-10-CM | POA: Diagnosis not present

## 2018-10-11 NOTE — Progress Notes (Signed)
MAKARI, PORTMAN ([id number]) Visit Report for 10/09/2018 Chief Complaint Document Details Patient Name: [name], [name]. Date of Service: 10/09/2018 1:45 PM Medical Record Number: [id number] Patient Account Number: [id number] Date of Birth/Sex: [birth year] (78 y.o. Male) Treating RN: [name] Primary Care Provider: Tedra [name] Other Clinician: Referring Provider: Tedra [name] Treating Provider/Extender: [name], [name] in Treatment: 4 Information Obtained from: Patient Chief Complaint Abdominal ulcers Electronic Signature(s) Signed: 10/09/2018 5:12:14 PM By: Worthy Keeler PA-C Entered By: Worthy [name] on 10/09/2018 13:43:48 [name], [name] ([id number]) -------------------------------------------------------------------------------- HPI Details Patient Name: [name]. Date of Service: 10/09/2018 1:45 PM Medical Record Number: [id number] Patient Account Number: [id number] Date of Birth/Sex: November 06, 1940 (78 y.o. Male) Treating RN: [name] Primary Care Provider: Tedra [name] Other Clinician: Referring Provider: Tedra [name] Treating Provider/Extender: [name], [name] in Treatment: 4 History of Present Illness HPI Description: 78 year old gentleman seen earlier this year in January for a abdominal wound is now back with the same problem which has recurred for about 2 [name] and he has been trying to apply some local antibiotic ointment and a Band-Aid there. Of note he has significant pressure in this area due to his beltline and may have put on some weight which results in his trousers being very tight around the waist. The else has changed in his HandP and his diabetes is pretty well controlled. most recent hemoglobin A1c on 12/06/2016 was 7.8%. he was reviewed by his endocrinologist Dr. Dwyane [name] who made appropriate modifications to his treatment plan. he was recently seen by neurology for mild cognitive impairment that was found to be normal for his  age 16/21/18 patient appears to be doing well in regard to his abdominal wound area on evaluation today. He has been tolerating the dressing changes with the [facility] Dressing I'm pleased with how this has progressed and in fact the wound appears to be completely healed on evaluation today. There is definitely no evidence of infection. 03/29/17 Patient's wound on the abdominal region appears to be completely healed on evaluation today. This wound has been doing very well and I think the biggest thing is going to be keeping it from reactivation where it rubbed on his pants. 04/18/2017 -- he was recently discharged about 3 [name] ago and was using a binder which probably caused him abrasion on the area of the recently healed scar. 05/02/17 on evaluation today patient appears to be doing fairly well in regard to his abdominal wound at the site of the surgical scar. Unfortunately the scar tissue is very fragile and tends to reopen. He did get a small abdominal binder to try to pad the area in order to offload and prevent his pants from rubbing specifically at the site. With that being said he tells me that Dr. Con [name] was concerned that the binder might actually have rub the area causing a reopening. I did have a look at this today and unfortunately it appears that he was putting this over his pants which was making matters worse. 05/09/17 on evaluation today patient continues to do well with the [facility] Dressing. There is a slight skin tear inferior to the wound that we have been treating that may have been due to the dressing sticking slightly. This is minimal however. I did discuss with patient prior to removal of the dressing for dressing changes that they whet this in order to loosen it and not risk damaging in the field. Nonetheless it appears that they have been doing  very well with the dressing changes he had his wife took over the dressing changes as she states that he was doing it wrong.  Overall though I'm pleased with how things are progressing. No fevers, chills, nausea, or vomiting noted at this time. Readmission: 08/27/17 on evaluation today patient appears to be doing similarly to how he was doing prior to healing previously August 2018 when we previously discharged him. He has fortunately been doing well up until just a couple of [name] ago and he had some of the [facility] Dressing over so he began to treat this on his own. He states that this has gotten much smaller but he ran out of supplies and therefore made an appointment to come back in for evaluation. No fevers, chills, nausea, or vomiting noted at this time. He has no discomfort at this point in regard to the wound. He does tell me that he has one maybe two compression fractures in his lower back and that he is going to be seen by the physician at [facility] imaging, Dr. Estanislado [name]. ====== Old notes The 78 year old gentleman who has a past medical history of diabetes mellitus, end-stage renal disease on dialysis, hypertension, secondary hyperparathyroidism, peripheral vascular disease and history of previous arterial bifemoral bypass graft in 1992 also has a history of gout, hyperlipidemia, hypothyroidism. last hemoglobin A1c was 7.5%. status post AV fistula [name], [name]. ([id number]) placement, breast surgery for left granulomatous mastitis, triple a repair in 1992, small bowel obstruction, upper arm angiograms for AV fistula on the left side. was noted to have a superficial skin wound in the periumbilical area since late December 2017. He was asked to clean it with hydrogen peroxide and apply Bactroban. He was seen by vascular surgery in October 2017 where his right ABI was 0.56 left ABI was 0.87 and right TBI was 0.69 and left TBI was 0.70 ===== 09/25/16; patient was previously a patient of Dr. Ardeen Garland who has recurrent skin breakdown in the area of the large incision that was initially 4 apparently an  open AAA aneurysm repair. He had secondary surgery but I'm not sure of what etiology this was. He is been using Hydrofera Blue to the open wounds in the mid abdominal surgical incision. This is now closed over. 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 He states that he actually had several areas that were open there appears just be one remaining. He has been tolerating the dressing changes is wise been using Hydrofera Blue Dressing as left over from his abdominal ulcer that we have previously treated. Fortunately there does not appear to be any evidence of infection which is good news. With that being said he states that he's been having issues at one point even at dialysis he had to discontinue dialysis and go home due to the pain. He has not been using any cushion to sit on although he states he does have one that he can use. No fevers, chills, nausea, or vomiting noted at this time. He had a significant issue with diarrhea prior to this occurring and I believe this may have had some contributing factor as well. 02/18/18 on evaluation today patient's wound actually appears to be doing about the same. Unfortunately he really has not noted any significant improvement over last week he still is  very tender even just to light touch unfortunately over the periwound region. There does not appear to be any evidence of infection which is good news. He is especially tender at the 12 o'clock location even a full fingers with away from the actual wound site. There still slough covering the surface of the wound. 03/04/18 on evaluation today patient's wound actually appears to show signs of great improvement compared to last week's evaluation. He is definitely showing improvement week by week which I think is  great news. Obviously that is what we are looking for. Nonetheless I did not even need to perform any sharp debridement today being that there was no slough noted on the surface of the wound. His blood pressure continues to remain low although this is something that is normal. That is for him. 03/18/18 on evaluation today patient's wound actually appears to potentially be completely healed although I cannot tell for sure. He has a small indeed very small eschar noted over the wound surface which also was measuring smaller than what it was previous. With that being said he has discomfort to the point that I'm not really able to do much with this in order to remove the eschar and prove that everything is completely healed underneath. Nonetheless it's starting to look like this may lift up on its own and I think that may be the best course for him currently. 04/01/18 on evaluation today patient appears to be doing very well in fact his wound is completely healed today. He's been tolerating the dressing changes without complication. With that being said I do believe at this point that he is ready for discharge she still having some discomfort deeper and he tells me this may just be some irritation from the long periods of time he has to sit for dialysis nonetheless he's not having the redness that was previously noted I think he has done very well trying to keep pressure off of this area. Readmission: 09/11/18 on evaluation today patient presents for reevaluation here in our clinic due to a remission with ulcers of the abdominal area. This is a region that he has previously been treated for bar clinic as well we were able to get this healed. 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 He states he was in the hospital when this just suddenly came up toward the end of his hospital stay. There really is nothing else currently going on  indicating more significant issue at this point. Fortunately No fevers, chills, nausea, or vomiting noted at this time. The patient does have diabetes, is on dialysis, and states that the area does tend to hurt but only near the bottom of the wound though this is rather painful. 10/02/18 on evaluation today patient's abdominal ulcer is actually appear to be doing excellent at this point. He's been tolerating the dressing changes without complication. I do believe that this is doing very well for him. No fevers, chills, nausea, or Hubert, [name]. ([id number]) vomiting noted at this time. 10/09/18 on evaluation today patient appears to be doing very well in regard to his abdominal ulcers. In fact one area is almost completely healed. The other is shown signs of good epithelialization at this point. Overall I see no signs of infection and things seem to be progressing nicely. Electronic Signature(s) Signed: 10/09/2018 5:12:14 PM By: Worthy Keeler PA-C Entered By: Worthy [name] on 10/09/2018 15:28:10 Wayne, [name] ([id number]) --------------------------------------------------------------------------------  Physical Exam Details Patient Name: TATEDaulton, Harbaugh. Date of Service: 10/09/2018 1:45 PM Medical Record Number: [id number] Patient Account Number: [id number] Date of Birth/Sex: 06/17/1941 (78 y.o. Male) Treating RN: [name] Primary Care Provider: Tedra [name] Other Clinician: Referring Provider: Tedra [name] Treating Provider/Extender: STONE III, [name] in Treatment: 4 Constitutional Well-nourished and well-hydrated in no acute distress. Respiratory normal breathing without difficulty. clear to auscultation bilaterally. Cardiovascular regular rate and rhythm with normal S1, S2. Psychiatric this patient is able to make decisions and demonstrates good insight into disease process. Alert and Oriented x 3. pleasant and cooperative. Notes Patient's wound appears to be doing well  today with good granulation noted in the base of the wound. Overall very pleased with the appearance no sharp debridement was required at this point. Electronic Signature(s) Signed: 10/09/2018 5:12:14 PM By: Worthy Keeler PA-C Entered By: Worthy [name] on 10/09/2018 15:28:45 Thornton, [name] ([id number]) -------------------------------------------------------------------------------- Physician Orders Details Patient Name: [name]. Date of Service: 10/09/2018 1:45 PM Medical Record Number: [id number] Patient Account Number: [id number] Date of Birth/Sex: October 01, 1940 (78 y.o. 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 Anesthetic (add to Medication List) Wound #6 Right Abdomen - Lower Quadrant o Topical Lidocaine 4% cream applied to wound bed prior to debridement (In Clinic Only). Primary Wound Dressing Wound #6 Right Abdomen - Lower Quadrant o Hydrafera Blue Ready Transfer Secondary Dressing Wound #6 Right Abdomen - Lower Quadrant o Boardered Foam Dressing Dressing Change Frequency Wound #6 Right Abdomen - Lower Quadrant o Change Dressing Monday, Wednesday, Friday Follow-up Appointments Wound #6 Right Abdomen - Lower Quadrant o Return Appointment in 1 week. Home Health Wound #6 Right Abdomen - [place] Visits o Home Health Nurse may visit PRN to address patientos wound care needs. o FACE TO FACE ENCOUNTER:  MEDICARE and MEDICAID PATIENTS: I certify that this patient is under my care and that I had a face-to-face encounter that meets the physician face-to-face encounter requirements with this patient on this date. The encounter with the patient was in whole or in part for the following MEDICAL CONDITION: (primary reason for [place]) MEDICAL NECESSITY: I certify, that based on my findings, NURSING services are a medically necessary home health service. HOME BOUND STATUS: I certify that my clinical findings support that this patient is homebound (i.e., Due to illness or injury, pt requires aid of Armendarez, [name] ([id number]) supportive devices such as crutches, cane, wheelchairs, walkers, the use of special transportation or the assistance of another person to leave their place of residence. There is a normal inability to leave the home and doing so requires considerable and taxing effort. Other absences are for medical reasons / religious services and are infrequent or of short duration when for other reasons). o If current dressing causes regression in wound condition, may D/C ordered dressing product/s and apply Normal Saline Moist Dressing daily until next [place] / Other MD appointment. [place] of regression in wound condition at [phone]. o Please direct any NON-WOUND  related issues/requests for orders to patient's Primary Care Physician Electronic Signature(s) Signed: 10/09/2018 4:56:33 PM By: [name] Signed: 10/09/2018 5:12:14 PM By: Worthy Keeler PA-C Entered By: [name] on 10/09/2018 15:12:39 Ent, [name] ([id number]) -------------------------------------------------------------------------------- Problem List Details Patient Name: [name]. Date of Service: 10/09/2018 1:45 PM Medical Record Number: [id number] Patient Account Number: [id number] Date of Birth/Sex: 04/13/1941 (78 y.o. 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 Date of Service: 10/09/2018 1:45 PM Medical Record Number: [id number] Patient Account Number: [id number] Date of Birth/Sex: [birth year] (78 y.o. Male) Treating RN: [name] Primary Care Provider: Tedra [name] Other Clinician: Referring Provider: Tedra [name] Treating Provider/Extender: [name], [name] in Treatment: 4 Subjective Chief Complaint Information obtained from Patient Abdominal ulcers History of Present Illness (HPI) 78 year old gentleman seen earlier this year in January for a abdominal wound is now back with the same problem which has recurred for about 2 [name] and he has been trying to apply some local antibiotic ointment and a Band-Aid there. Of note he has significant pressure in this area due to his beltline and may have put on some weight which results in his trousers being very tight around the waist. The else has changed in his HandP and his diabetes is pretty well controlled. most recent hemoglobin A1c on 12/06/2016 was 7.8%. he was reviewed by his endocrinologist Dr. Dwyane [name] who made  appropriate modifications to his treatment plan. he was recently seen by neurology for mild cognitive impairment that was found to be normal for his age 33/21/18 patient appears to be doing well in regard to his abdominal wound area on evaluation today. He has been tolerating the dressing changes with the [facility] Dressing I'm pleased with how this has progressed and in fact the wound appears to be completely healed on evaluation today. There is definitely no evidence of infection. 03/29/17 Patient's wound on the abdominal region appears to be completely healed on evaluation today. This wound has been doing very well and I think the biggest thing is going to be keeping it from reactivation where it rubbed on his pants. 04/18/2017 -- he was recently discharged about 3 [name] ago and was using a binder which probably caused him abrasion on the area of the recently healed scar. 05/02/17 on evaluation today patient appears to be doing fairly well in regard to his abdominal wound at the site of the surgical scar. Unfortunately the scar tissue is very fragile and tends to reopen. He did get a small abdominal binder to try to pad the area in order to offload and prevent his pants from rubbing specifically at the site. With that being said he tells me that Dr. Con [name] was concerned that the binder might actually have rub the area causing a reopening. I did have a look at this today and unfortunately it appears that he  was putting this over his pants which was making matters worse. 05/09/17 on evaluation today patient continues to do well with the [facility] Dressing. There is a slight skin tear inferior to the wound that we have been treating that may have been due to the dressing sticking slightly. This is minimal however. I did discuss with patient prior to removal of the dressing for dressing changes that they whet this in order to loosen it and not risk damaging in the field. Nonetheless it appears  that they have been doing very well with the dressing changes he had his wife took over the dressing changes as she states that he was doing it wrong. Overall though I'm pleased with how things are progressing. No fevers, chills, nausea, or vomiting noted at this time. Readmission: 08/27/17 on evaluation today patient appears to be doing similarly to how he was doing prior to healing previously August 2018 when we previously discharged him. He has fortunately been doing well up until just a couple of [name] ago and he had some of the [facility] Dressing over so he began to treat this on his own. He states that this has gotten much smaller but he ran out of supplies and therefore made an appointment to come back in for evaluation. No fevers, chills, nausea, or vomiting noted at this time. He has no discomfort at this point in regard to the wound. He does tell me that he has one maybe two compression fractures in his lower back and that he is going to be seen by the physician at [facility] imaging, Dr. Estanislado [name]. Viglione, [name] ([id number]) ====== Old notes The 78 year old gentleman who has a past medical history of diabetes mellitus, end-stage renal disease on dialysis, hypertension, secondary hyperparathyroidism, peripheral vascular disease and history of previous arterial bifemoral bypass graft in 1992 also has a history of gout, hyperlipidemia, hypothyroidism. last hemoglobin A1c was 7.5%. status post AV fistula placement, breast surgery for left granulomatous mastitis, triple a repair in 1992, small bowel obstruction, upper arm angiograms for AV fistula on the left side. was noted to have a superficial skin wound in the periumbilical area since late December 2017. He was asked to clean it with hydrogen peroxide and apply Bactroban. He was seen by vascular surgery in October 2017 where his right ABI was 0.56 left ABI was 0.87 and right TBI was 0.69 and left TBI was 0.70 ===== 09/25/16;  patient was previously a patient of Dr. Ardeen Garland who has recurrent skin breakdown in the area of the large incision that was initially 4 apparently an open AAA aneurysm repair. He had secondary surgery but I'm not sure of what etiology this was. He is been using Hydrofera Blue to the open wounds in the mid abdominal surgical incision. This is now closed over. 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 He states that he actually had several areas that were open there appears just be one remaining. He has been tolerating the dressing changes is wise been using Hydrofera Blue Dressing as left over from his abdominal ulcer that we have previously treated. Fortunately there does not appear to be any evidence of infection which is good news. With that being said he states that he's been having issues at one point even at dialysis he had to  discontinue dialysis and go home due to the pain. He has not been using any cushion to sit on although he states he does have one that he can use. No fevers, chills, nausea, or vomiting noted at this time. He had a significant issue with diarrhea prior to this occurring and I believe this may have had some contributing factor as well. 02/18/18 on evaluation today patient's wound actually appears to be doing about the same. Unfortunately he really has not noted any significant improvement over last week he still is very tender even just to light touch unfortunately over the periwound region. There does not appear to be any evidence of infection which is good news. He is especially tender at the 12 o'clock location even a full fingers with away from the actual wound site. There still slough covering the surface of the wound. 03/04/18 on evaluation today patient's wound actually  appears to show signs of great improvement compared to last week's evaluation. He is definitely showing improvement week by week which I think is great news. Obviously that is what we are looking for. Nonetheless I did not even need to perform any sharp debridement today being that there was no slough noted on the surface of the wound. His blood pressure continues to remain low although this is something that is normal. That is for him. 03/18/18 on evaluation today patient's wound actually appears to potentially be completely healed although I cannot tell for sure. He has a small indeed very small eschar noted over the wound surface which also was measuring smaller than what it was previous. With that being said he has discomfort to the point that I'm not really able to do much with this in order to remove the eschar and prove that everything is completely healed underneath. Nonetheless it's starting to look like this may lift up on its own and I think that may be the best course for him currently. 04/01/18 on evaluation today patient appears to be doing very well in fact his wound is completely healed today. He's been tolerating the dressing changes without complication. With that being said I do believe at this point that he is ready for discharge she still having some discomfort deeper and he tells me this may just be some irritation from the long periods of time he has to sit for dialysis nonetheless he's not having the redness that was previously noted I think he has done very well trying to keep pressure off of this area. Readmission: 09/11/18 on evaluation today patient presents for reevaluation here in our clinic due to a remission with ulcers of the abdominal area. This is a region that he has previously been treated for bar clinic as well we were able to get this healed. 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 He states  he was in the hospital when this just suddenly came up toward the end of his hospital stay. There really is nothing else currently going on indicating more significant issue at this point. Fortunately No fevers, chills, nausea, or vomiting noted at this time. Coufal, ANTWIAN SANTAANA ([id number]) The patient does have diabetes, is on dialysis, and states that the area does tend to hurt but only near the bottom of the wound though this is rather painful. 10/02/18 on evaluation today patient's abdominal ulcer is actually appear to be doing excellent at this point. He's been tolerating the dressing changes without complication. I do  believe that this is doing very well for him. No fevers, chills, nausea, or vomiting noted at this time. 10/09/18 on evaluation today patient appears to be doing very well in regard to his abdominal ulcers. In fact one area is almost completely healed. The other is shown signs of good epithelialization at this point. Overall I see no signs of infection and things seem to be progressing nicely. Patient History Information obtained from Patient. Family History Diabetes - Father,Paternal Grandparents, Heart Disease - Father, Hypertension - Father, No family history of Cancer, Hereditary Spherocytosis, Kidney Disease, Lung Disease, Seizures, Stroke, Thyroid Problems, Tuberculosis. Social History Former smoker - quit in 1996, Marital Status - Married, Alcohol Use - Never - quit in 1976, Drug Use - No History, Caffeine Use - Never. 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 Respiratory The patient has no complaints or symptoms. Cardiovascular The patient has no complaints or symptoms. Psychiatric The patient has no complaints or  symptoms. Objective Constitutional Well-nourished and well-hydrated in no acute distress. Vitals Time Taken: 2:14 PM, Height: 71 in, Weight: 146 lbs, BMI: 20.4, Pulse: 88 bpm, Respiratory Rate: 18 breaths/min, Blood Pressure: 93/40 mmHg. General Notes: Patient has been eating ice. Temperature will not register. Respiratory Se[place]o, [name]. ([id number]) normal breathing without difficulty. clear to auscultation bilaterally. Cardiovascular regular rate and rhythm with normal S1, S2. Psychiatric this patient is able to make decisions and demonstrates good insight into disease process. Alert and Oriented x 3. pleasant and cooperative. General Notes: Patient's wound appears to be doing well today with good granulation noted in the base of the wound. Overall very pleased with the appearance no sharp debridement was required at this point. Integumentary (Hair, Skin) Wound #6 status is Open. Original cause of wound was Blister. The wound is located on the Right Abdomen - Lower Quadrant. The wound measures 1.4cm length x 4cm width x 0.1cm depth; 4.398cm^2 area and 0.44cm^3 volume. There is Fat Layer (Subcutaneous Tissue) Exposed exposed. There is no tunneling or undermining noted. There is a medium amount of purulent drainage noted. The wound margin is flat and intact. There is large (67-100%) red granulation within the wound bed. There is a small (1-33%) amount of necrotic tissue within the wound bed including Eschar and Adherent Slough. The periwound skin appearance exhibited: Scarring. The periwound skin appearance did not exhibit: Callus, Crepitus, Excoriation, Induration, Rash, Dry/Scaly, Maceration, Atrophie Blanche, Cyanosis, Ecchymosis, Hemosiderin Staining, Mottled, Pallor, Rubor, Erythema. Periwound temperature was noted as No Abnormality. The periwound has tenderness on palpation. 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 Anesthetic (add to Medication List): Wound #6 Right Abdomen - Lower Quadrant: Topical Lidocaine 4% cream applied to wound bed prior to debridement (In Clinic Only). Primary Wound Dressing: Wound #6 Right Abdomen - Lower Quadrant: Hydrafera Blue Ready Transfer Secondary Dressing: Wound #6 Right Abdomen - Lower Quadrant: Boardered Foam Dressing Dressing Change Frequency: Wound #6 Right Abdomen - Lower Quadrant: Change Dressing Monday, Wednesday, Friday [name], [name] ([id number]) Follow-up Appointments: Wound #6 Right Abdomen - Lower Quadrant: Return Appointment in 1 week. Home Health: Wound #6 Right Abdomen - Lower Quadrant: [place]  Visits Home Health Nurse may visit PRN to address patient s wound care needs. FACE TO FACE ENCOUNTER: MEDICARE and MEDICAID PATIENTS: I certify that this patient is under my care and that I had a face-to-face encounter that meets the physician face-to-face encounter requirements with this patient on this date. The encounter with the patient was in whole or in part for the following MEDICAL CONDITION: (primary reason for [place]) MEDICAL NECESSITY: I certify, that based on my findings, NURSING services are a medically necessary home health service. HOME BOUND STATUS: I certify that my clinical findings support that this patient is homebound (i.e., Due to illness or injury, pt requires aid of supportive devices such as crutches, cane, wheelchairs, walkers, the use of special transportation or the assistance of another person to leave their place of residence. There is a normal inability to leave the home and doing so requires considerable and taxing effort. Other absences are for medical reasons / religious services and are  infrequent or of short duration when for other reasons). If current dressing causes regression in wound condition, may D/C ordered dressing product/s and apply Normal Saline Moist Dressing daily until next [place] / Other MD appointment. [place] of regression in wound condition at [phone]. Please direct any NON-WOUND related issues/requests for orders to patient's Primary Care Physician My suggestion currently is gonna be that we continue with the above wound care measures for the next week. The patient is in agreement the plan. We will see were things stand at follow-up. Please see above for specific wound care orders. We will see patient for re-evaluation in 1 week(s) here in the clinic. If anything worsens or changes patient will contact our office for additional recommendations. Electronic Signature(s) Signed: 10/09/2018 5:12:14 PM By: Worthy Keeler PA-C Entered By: Worthy [name] on 10/09/2018 15:29:04 Leclaire, [name] ([id number]) -------------------------------------------------------------------------------- ROS/PFSH Details Patient Name: [name]. Date of Service: 10/09/2018 1:45 PM Medical Record Number: [id number] Patient Account Number: [id number] Date of Birth/Sex: 1941/09/01 (78 y.o. Male) Treating RN: [name] Primary Care Provider: Tedra [name] Other Clinician: Referring Provider: Tedra [name] Treating Provider/Extender: [name], [name] in Treatment: 4 Information Obtained From Patient Wound History Do you currently have one or more open woundso Yes How many open wounds do you currently haveo 2 Approximately how long have you had your woundso 2 [name] How have you been treating your wound(s) until nowo zinc ,drsg Has your wound(s) ever healed and then re-openedo No Have you had any lab work done in the past montho Yes Who ordered the lab work doneo [facility] doctor [facility] Have you tested positive for an antibiotic  resistant organism (MRSA, VRE)o No Have you tested positive for osteomyelitis (bone infection)o No Have you had any tests for circulation on your legso No Constitutional Symptoms (General Health) Complaints and Symptoms: Negative for: Fever; Chills Eyes Medical History: Negative for: Cataracts; Glaucoma; Optic Neuritis Ear/Nose/Mouth/Throat Medical History: Negative for: Chronic sinus problems/congestion; Middle ear problems Hematologic/Lymphatic Medical History: Positive for: Anemia Negative for: Hemophilia; Human Immunodeficiency Virus; Lymphedema; Sickle Cell Disease Respiratory Complaints and Symptoms: No Complaints or Symptoms Cardiovascular Complaints and Symptoms: No Complaints or Symptoms Medical History: Positive for: Hypertension; Peripheral Venous Disease Negative for: Angina; Arrhythmia; Congestive Heart Failure; Coronary Artery Disease; Deep Vein Thrombosis; Hypotension; Myocardial Infarction; Peripheral Arterial Disease; Phlebitis; Vasculitis [name], [name] ([id number]) Past Medical History Notes: artificial valve, hyperlipidemia Gastrointestinal Medical History: Negative for: Cirrhosis ; Colitis; Crohnos; Hepatitis A; Hepatitis B;  Hepatitis C Past Medical History Notes: scar tissue from multiple surgeries Endocrine Medical History: Positive for: Type II Diabetes Negative for: Type I Diabetes Treated with: Oral agents Genitourinary Medical History: 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 Immunological Medical History: Negative for: Lupus Erythematosus; Raynaudos; Scleroderma Integumentary (Skin) Medical History: Negative for: History of Burn; History of pressure wounds Musculoskeletal Medical History: Positive for: Gout Negative for: Rheumatoid Arthritis; Osteoarthritis; Osteomyelitis Neurologic Medical History: Positive for: Neuropathy Negative for: Dementia; Quadriplegia; Paraplegia; Seizure Disorder Oncologic Medical  History: Negative for: Received Chemotherapy; Received Radiation Psychiatric Complaints and Symptoms: No Complaints or Symptoms Medical History: Negative for: Anorexia/bulimia; Confinement Anxiety Immunizations [name], [name] ([id number]) Pneumococcal Vaccine: Received Pneumococcal Vaccination: Yes Immunization Notes: uo to date Implantable Devices Family and Social History Cancer: No; Diabetes: Yes - Father,Paternal Grandparents; Heart Disease: Yes - Father; Hereditary Spherocytosis: No; Hypertension: Yes - Father; Kidney Disease: No; Lung Disease: No; Seizures: No; Stroke: No; Thyroid Problems: No; Tuberculosis: No; Former smoker - quit in 1996; Marital Status - Married; Alcohol Use: Never - quit in 1976; Drug Use: No History; Caffeine Use: Never; Financial Concerns: No; Food, Clothing or Shelter Needs: No; Support System Lacking: No; Transportation Concerns: No; Advanced Directives: No; Patient does not want information on Advanced Directives Physician Affirmation I have reviewed and agree with the above information. Electronic Signature(s) Signed: 10/09/2018 4:56:33 PM By: [name] Signed: 10/09/2018 5:12:14 PM By: Worthy Keeler PA-C Entered By: Worthy [name] on 10/09/2018 15:28:25 [name], [name] ([id number]) -------------------------------------------------------------------------------- SuperBill Details Patient Name: [name]. Date of Service: 10/09/2018 Medical Record Number: [id number] Patient Account Number: [id number] Date of Birth/Sex: 09-03-1941 (78 y.o. Male) Treating RN: [name] Primary Care Provider: Tedra [name] Other Clinician: Referring Provider: Tedra [name] Treating Provider/Extender: [name], [name] in Treatment: 4 Diagnosis Coding ICD-10 Codes Code Description E83.59 Other disorders of calcium metabolism L98.492 Non-pressure chronic ulcer of skin of other sites with fat layer exposed E11.622 Type 2 diabetes mellitus with  other skin ulcer Z99.2 Dependence on renal dialysis Facility Procedures CPT4 Code: 84132440 Description: 99213 - WOUND CARE VISIT-LEV 3 EST PT Modifier: Quantity: 1 Physician Procedures CPT4 Code: 1027253 Description: 66440 - WC PHYS LEVEL 4 - EST PT ICD-10 Diagnosis Description E83.59 Other disorders of calcium metabolism L98.492 Non-pressure chronic ulcer of skin of other sites with fat l E11.622 Type 2 diabetes mellitus with other skin ulcer Z99.2  Dependence on renal dialysis Modifier: ayer exposed Quantity: 1 Electronic Signature(s) Signed: 10/09/2018 4:56:33 PM By: [name] Signed: 10/09/2018 5:12:14 PM By: Worthy Keeler PA-C Entered By: [name] on 10/09/2018 15:54:12

## 2018-10-11 NOTE — Progress Notes (Signed)
DAVIEL, ALLEGRETTO ([id number]) Visit Report for 10/09/2018 Arrival Information Details Patient Name: [name], [name]. Date of Service: 10/09/2018 1:45 PM Medical Record Number: [id number] Patient Account Number: [id number] Date of Birth/Sex: 03/07/1941 (78 y.o. Male) Treating RN: Cornell Barman Primary Care Lydon Vansickle: Tedra Senegal Other Clinician: Referring Blaise Palladino: Tedra Senegal Treating Ariel Dimitri/Extender: [name], HOYT Weeks in Treatment: 4 Visit Information History Since Last Visit Added or deleted any medications: No Patient Arrived: Walker Any new allergies or adverse reactions: No Arrival Time: 14:13 Had a fall or experienced change in No Accompanied By: self activities of daily living that may affect Transfer Assistance: None risk of falls: Patient Identification Verified: Yes Signs or symptoms of abuse/neglect since last visito No Secondary Verification Process Completed: Yes Hospitalized since last visit: No Patient Requires Transmission-Based Precautions: No Implantable device outside of the clinic excluding No Patient Has Alerts: No cellular tissue based products placed in the center since last visit: Has Dressing in Place as Prescribed: Yes Pain Present Now: No Electronic Signature(s) Signed: 10/09/2018 5:58:00 PM By: Gretta Cool, BSN, RN, CWS, Kim RN, BSN Entered By: Gretta Cool, BSN, RN, CWS, Kim on 10/09/2018 14:13:42 [name], [name] ([id number]) -------------------------------------------------------------------------------- Clinic Level of Care Assessment Details Patient Name: [name], [name]. Date of Service: 10/09/2018 1:45 PM Medical Record Number: [id number] Patient Account Number: [id number] Date of Birth/Sex: 1941-06-21 (78 y.o. Male) Treating RN: Montey Hora Primary Care Jameah Rouser: Tedra Senegal Other Clinician: Referring Janathan Bribiesca: Tedra Senegal Treating Leeandra Ellerson/Extender: [name], HOYT Weeks in Treatment: 4 Clinic Level of Care Assessment Items TOOL 4 Quantity  Score []  - Use when only an EandM is performed on FOLLOW-UP visit 0 ASSESSMENTS - Nursing Assessment / Reassessment X - Reassessment of Co-morbidities (includes updates in patient status) 1 10 X- 1 5 Reassessment of Adherence to Treatment Plan ASSESSMENTS - Wound and Skin Assessment / Reassessment X - Simple Wound Assessment / Reassessment - one wound 1 5 []  - 0 Complex Wound Assessment / Reassessment - multiple wounds []  - 0 Dermatologic / Skin Assessment (not related to wound area) ASSESSMENTS - Focused Assessment []  - Circumferential Edema Measurements - multi extremities 0 []  - 0 Nutritional Assessment / Counseling / Intervention []  - 0 Lower Extremity Assessment (monofilament, tuning fork, pulses) []  - 0 Peripheral Arterial Disease Assessment (using hand held doppler) ASSESSMENTS - Ostomy and/or Continence Assessment and Care []  - Incontinence Assessment and Management 0 []  - 0 Ostomy Care Assessment and Management (repouching, etc.) PROCESS - Coordination of Care X - Simple Patient / Family Education for ongoing care 1 15 []  - 0 Complex (extensive) Patient / Family Education for ongoing care X- 1 10 Staff obtains Programmer, systems, Records, Test Results / Process Orders []  - 0 Staff telephones HHA, Nursing Homes / Clarify orders / etc []  - 0 Routine Transfer to another Facility (non-emergent condition) []  - 0 Routine Hospital Admission (non-emergent condition) []  - 0 New Admissions / Biomedical engineer / Ordering NPWT, Apligraf, etc. []  - 0 Emergency Hospital Admission (emergent condition) X- 1 10 Simple Discharge Coordination [name], [name] ([id number]) []  - 0 Complex (extensive) Discharge Coordination PROCESS - Special Needs []  - Pediatric / Minor Patient Management 0 []  - 0 Isolation Patient Management []  - 0 Hearing / Language / Visual special needs []  - 0 Assessment of Community assistance (transportation, D/C planning, etc.) []  - 0 Additional assistance  / Altered mentation []  - 0 Support Surface(s) Assessment (bed, cushion, seat, etc.) INTERVENTIONS - Wound Cleansing / Measurement X - Simple Wound Cleansing - one  wound 1 5 []  - 0 Complex Wound Cleansing - multiple wounds X- 1 5 Wound Imaging (photographs - any number of wounds) []  - 0 Wound Tracing (instead of photographs) X- 1 5 Simple Wound Measurement - one wound []  - 0 Complex Wound Measurement - multiple wounds INTERVENTIONS - Wound Dressings X - Small Wound Dressing one or multiple wounds 1 10 []  - 0 Medium Wound Dressing one or multiple wounds []  - 0 Large Wound Dressing one or multiple wounds []  - 0 Application of Medications - topical []  - 0 Application of Medications - injection INTERVENTIONS - Miscellaneous []  - External ear exam 0 []  - 0 Specimen Collection (cultures, biopsies, blood, body fluids, etc.) []  - 0 Specimen(s) / Culture(s) sent or taken to Lab for analysis []  - 0 Patient Transfer (multiple staff / Civil Service fast streamer / Similar devices) []  - 0 Simple Staple / Suture removal (25 or less) []  - 0 Complex Staple / Suture removal (26 or more) []  - 0 Hypo / Hyperglycemic Management (close monitor of Blood Glucose) []  - 0 Ankle / Brachial Index (ABI) - do not check if billed separately X- 1 5 Vital Signs Mullane, [name] ([id number]) Has the patient been seen at the hospital within the last three years: Yes Total Score: 85 Level Of Care: New/Established - Level 3 Electronic Signature(s) Signed: 10/09/2018 4:56:33 PM By: Montey Hora Entered By: Montey Hora on 10/09/2018 15:54:06 [name], [name] ([id number]) -------------------------------------------------------------------------------- Encounter Discharge Information Details Patient Name: [name]. Date of Service: 10/09/2018 1:45 PM Medical Record Number: [id number] Patient Account Number: [id number] Date of Birth/Sex: [birth year] (78 y.o. Male) Treating RN: Montey Hora Primary Care  Rhiley Tarver: Tedra Senegal Other Clinician: Referring Ahmeer Tuman: Tedra Senegal Treating Shahed Yeoman/Extender: [name], HOYT Weeks in Treatment: 4 Encounter Discharge Information Items Discharge Condition: Stable Ambulatory Status: Walker Discharge Destination: Home Transportation: Private Auto Accompanied By: self Schedule Follow-up Appointment: Yes Clinical Summary of Care: Electronic Signature(s) Signed: 10/09/2018 4:56:33 PM By: Montey Hora Entered By: Montey Hora on 10/09/2018 15:53:17 [name], [name] ([id number]) -------------------------------------------------------------------------------- Lower Extremity Assessment Details Patient Name: [name]. Date of Service: 10/09/2018 1:45 PM Medical Record Number: [id number] Patient Account Number: [id number] Date of Birth/Sex: [birth year] (78 y.o. Male) Treating RN: Cornell Barman Primary Care Clarence Cogswell: Tedra Senegal Other Clinician: Referring Danyel Tobey: Tedra Senegal Treating Miriam Liles/Extender: Sharalyn Ink in Treatment: 4 Electronic Signature(s) Signed: 10/09/2018 5:58:00 PM By: Gretta Cool, BSN, RN, CWS, Kim RN, BSN Entered By: Gretta Cool, BSN, RN, CWS, Kim on 10/09/2018 14:23:22 [name], [name] ([id number]) -------------------------------------------------------------------------------- Multi Wound Chart Details Patient Name: [name]. Date of Service: 10/09/2018 1:45 PM Medical Record Number: [id number] Patient Account Number: [id number] Date of Birth/Sex: December 19, 1940 (78 y.o. Male) Treating RN: Montey Hora Primary Care Xayden Linsey: Tedra Senegal Other Clinician: Referring Tighe Gitto: Tedra Senegal Treating Brinlee Gambrell/Extender: [name], HOYT Weeks in Treatment: 4 Vital Signs Height(in): 71 Pulse(bpm): 88 Weight(lbs): 146 Blood Pressure(mmHg): 93/40 Body Mass Index(BMI): 20 Temperature(F): Respiratory Rate 18 (breaths/min): Photos: [6:No Photos] [N/A:N/A] Wound Location: [6:Right Abdomen - Lower Quadrant]  [N/A:N/A] Wounding Event: [6:Blister] [N/A:N/A] Primary Etiology: [6:Abscess] [N/A:N/A] Comorbid History: [6:Anemia, Hypertension, Peripheral Venous Disease, Type II Diabetes, End Stage Renal Disease, Gout, Neuropathy] [N/A:N/A] Date Acquired: [6:08/28/2018] [N/A:N/A] Weeks of Treatment: [6:4] [N/A:N/A] Wound Status: [6:Open] [N/A:N/A] Measurements L x W x D [6:1.4x4x0.1] [N/A:N/A] (cm) Area (cm) : [6:4.398] [N/A:N/A] Volume (cm) : [6:0.44] [N/A:N/A] % Reduction in Area: [6:89.90%] [N/A:N/A] % Reduction in Volume: [6:89.90%] [N/A:N/A] Classification: [6:Full Thickness Without Exposed Support Structures] [N/A:N/A] Exudate  Amount: [6:Medium] [N/A:N/A] Exudate Type: [6:Purulent] [N/A:N/A] Exudate Color: [6:yellow, brown, green] [N/A:N/A] Wound Margin: [6:Flat and Intact] [N/A:N/A] Granulation Amount: [6:Large (67-100%)] [N/A:N/A] Granulation Quality: [6:Red] [N/A:N/A] Necrotic Amount: [6:Small (1-33%)] [N/A:N/A] Necrotic Tissue: [6:Eschar, Adherent Slough] [N/A:N/A] Exposed Structures: [6:Fat Layer (Subcutaneous Tissue) Exposed: Yes Fascia: No Tendon: No Muscle: No Joint: No Bone: No] [N/A:N/A] Epithelialization: [6:Medium (34-66%)] [N/A:N/A] Periwound Skin Texture: Scarring: Yes N/A N/A Excoriation: No Induration: No Callus: No Crepitus: No Rash: No Periwound Skin Moisture: Maceration: No N/A N/A Dry/Scaly: No Periwound Skin Color: Atrophie Blanche: No N/A N/A Cyanosis: No Ecchymosis: No Erythema: No Hemosiderin Staining: No Mottled: No Pallor: No Rubor: No Temperature: No Abnormality N/A N/A Tenderness on Palpation: Yes N/A N/A Wound Preparation: Ulcer Cleansing: N/A N/A Rinsed/Irrigated with Saline Topical Anesthetic Applied: Other: lidocaine 4% Treatment Notes Electronic Signature(s) Signed: 10/09/2018 4:56:33 PM By: Montey Hora Entered By: Montey Hora on 10/09/2018 15:11:06 [name], [name]  ([id number]) -------------------------------------------------------------------------------- [place] Details Patient Name: [name]. Date of Service: 10/09/2018 1:45 PM Medical Record Number: [id number] Patient Account Number: [id number] Date of Birth/Sex: 01-01-1941 (77 y.o. Male) Treating RN: Montey Hora Primary Care Tymara Saur: Tedra Senegal Other Clinician: Referring Tadan Shill: Tedra Senegal Treating Canuto Kingston/Extender: [name], HOYT Weeks in Treatment: 4 Active Inactive Abuse / Safety / Falls / Self Care Management Nursing Diagnoses: Impaired physical mobility Goals: Patient will remain injury free related to falls Date Initiated: 09/11/2018 Target Resolution Date: 11/29/2018 Goal Status: Active Interventions: Assess fall risk on admission and as needed Notes: Nutrition Nursing Diagnoses: Potential for alteratiion in Nutrition/Potential for imbalanced nutrition Goals: Patient/caregiver agrees to and verbalizes understanding of need to use nutritional supplements and/or vitamins as prescribed Date Initiated: 09/11/2018 Target Resolution Date: 11/29/2018 Goal Status: Active Interventions: Assess patient nutrition upon admission and as needed per policy Notes: Orientation to the Wound Care Program Nursing Diagnoses: Knowledge deficit related to the wound healing center program Goals: Patient/caregiver will verbalize understanding of the [facility] Date Initiated: 09/11/2018 Target Resolution Date: 11/29/2018 Goal Status: Active Interventions: Provide education on orientation to the wound center [name], [name] ([id number]) Notes: Wound/Skin Impairment Nursing Diagnoses: Impaired tissue integrity Goals: Ulcer/skin breakdown will heal within 14 weeks Date Initiated: 09/11/2018 Target Resolution Date: 11/29/2018 Goal Status: Active Interventions: Assess patient/caregiver ability to obtain necessary supplies Assess  patient/caregiver ability to perform ulcer/skin care regimen upon admission and as needed Assess ulceration(s) every visit Notes: Electronic Signature(s) Signed: 10/09/2018 4:56:33 PM By: Montey Hora Entered By: Montey Hora on 10/09/2018 15:10:59 [name], [name] ([id number]) -------------------------------------------------------------------------------- Pain Assessment Details Patient Name: [name]. Date of Service: 10/09/2018 1:45 PM Medical Record Number: [id number] Patient Account Number: [id number] Date of Birth/Sex: [birth year] (78 y.o. Male) Treating RN: Cornell Barman Primary Care Elih Mooney: Tedra Senegal Other Clinician: Referring Zela Sobieski: Tedra Senegal Treating Alexiana Laverdure/Extender: [name], HOYT Weeks in Treatment: 4 Active Problems Location of Pain Severity and Description of Pain Patient Has Paino No Site Locations With Dressing Change: No Pain Management and Medication Current Pain Management: Goals for Pain Management Patient has back pain. Electronic Signature(s) Signed: 10/09/2018 5:58:00 PM By: Gretta Cool, BSN, RN, CWS, Kim RN, BSN Entered By: Gretta Cool, BSN, RN, CWS, Kim on 10/09/2018 14:14:00 [name], [name] ([id number]) -------------------------------------------------------------------------------- Patient/Caregiver Education Details Patient Name: [name]. Date of Service: 10/09/2018 1:45 PM Medical Record Number: [id number] Patient Account Number: [id number] Date of Birth/Gender: 1941-02-10 (78 y.o. Male) Treating RN: Montey Hora Primary Care Physician: Tedra Senegal Other Clinician: Referring Physician: Tedra Senegal Treating Physician/Extender: [name],  HOYT Weeks in Treatment: 4 Education Assessment Education Provided To: Patient Education Topics Provided Wound/Skin Impairment: Handouts: Other: wound care to continue as ordered Methods: Demonstration, Explain/Verbal Responses: State content correctly Electronic Signature(s) Signed:  10/09/2018 4:56:33 PM By: Montey Hora Entered By: Montey Hora on 10/09/2018 15:53:42 Eklund, [name] ([id number]) -------------------------------------------------------------------------------- Wound Assessment Details Patient Name: [name]. Date of Service: 10/09/2018 1:45 PM Medical Record Number: [id number] Patient Account Number: [id number] Date of Birth/Sex: 03/16/1941 (78 y.o. Male) Treating RN: Cornell Barman Primary Care Aileene Lanum: Tedra Senegal Other Clinician: Referring Brylin Stopper: Tedra Senegal Treating Ailie Gage/Extender: [name], HOYT Weeks in Treatment: 4 Wound Status Wound Number: 6 Primary Abscess Etiology: Wound Location: Right Abdomen - Lower Quadrant Wound Open Wounding Event: Blister Status: Date Acquired: 08/28/2018 Comorbid Anemia, Hypertension, Peripheral Venous Weeks Of Treatment: 4 History: Disease, Type II Diabetes, End Stage Renal Clustered Wound: No Disease, Gout, Neuropathy Photos Photo Uploaded By: Gretta Cool, BSN, RN, CWS, Kim on 10/09/2018 17:24:34 Wound Measurements Length: (cm) 1.4 Width: (cm) 4 Depth: (cm) 0.1 Area: (cm) 4.398 Volume: (cm) 0.44 % Reduction in Area: 89.9% % Reduction in Volume: 89.9% Epithelialization: Medium (34-66%) Tunneling: No Undermining: No Wound Description Full Thickness Without Exposed Support Foul Odo Classification: Structures Slough/F Wound Margin: Flat and Intact Exudate Medium Amount: Exudate Type: Purulent Exudate Color: yellow, brown, green r After Cleansing: No ibrino Yes Wound Bed Granulation Amount: Large (67-100%) Exposed Structure Granulation Quality: Red Fascia Exposed: No Necrotic Amount: Small (1-33%) Fat Layer (Subcutaneous Tissue) Exposed: Yes Necrotic Quality: Eschar, Adherent Slough Tendon Exposed: No Muscle Exposed: No Joint Exposed: No Bone Exposed: No Wirt, [name] ([id number]) Periwound Skin Texture Texture Color No Abnormalities Noted: No No Abnormalities Noted:  No Callus: No Atrophie Blanche: No Crepitus: No Cyanosis: No Excoriation: No Ecchymosis: No Induration: No Erythema: No Rash: No Hemosiderin Staining: No Scarring: Yes Mottled: No Pallor: No Moisture Rubor: No No Abnormalities Noted: No Dry / Scaly: No Temperature / Pain Maceration: No Temperature: No Abnormality Tenderness on Palpation: Yes Wound Preparation Ulcer Cleansing: Rinsed/Irrigated with Saline Topical Anesthetic Applied: Other: lidocaine 4%, Treatment Notes Wound #6 (Right Abdomen - Lower Quadrant) Notes Hydrofera Blue and bordered foam dressing Electronic Signature(s) Signed: 10/09/2018 5:58:00 PM By: Gretta Cool, BSN, RN, CWS, Kim RN, BSN Entered By: Gretta Cool, BSN, RN, CWS, Kim on 10/09/2018 14:21:42 [place], [name] ([id number]) -------------------------------------------------------------------------------- Vitals Details Patient Name: [name]. Date of Service: 10/09/2018 1:45 PM Medical Record Number: [id number] Patient Account Number: [id number] Date of Birth/Sex: 1940/10/01 (78 y.o. Male) Treating RN: Cornell Barman Primary Care Tillie Viverette: Tedra Senegal Other Clinician: Referring Jauna Raczynski: Tedra Senegal Treating Kasheena Sambrano/Extender: [name], HOYT Weeks in Treatment: 4 Vital Signs Time Taken: 14:14 Pulse (bpm): 88 Height (in): 71 Respiratory Rate (breaths/min): 18 Weight (lbs): 146 Blood Pressure (mmHg): 93/40 Body Mass Index (BMI): 20.4 Reference Range: 80 - 120 mg / dl Notes Patient has been eating ice. Temperature will not register. Electronic Signature(s) Signed: 10/09/2018 5:58:00 PM By: Gretta Cool, BSN, RN, CWS, Kim RN, BSN Entered By: Gretta Cool, BSN, RN, CWS, Kim on 10/09/2018 14:14:49

## 2018-10-13 DIAGNOSIS — D509 Iron deficiency anemia, unspecified: Secondary | ICD-10-CM | POA: Diagnosis not present

## 2018-10-13 DIAGNOSIS — N186 End stage renal disease: Secondary | ICD-10-CM | POA: Diagnosis not present

## 2018-10-13 DIAGNOSIS — D631 Anemia in chronic kidney disease: Secondary | ICD-10-CM | POA: Diagnosis not present

## 2018-10-13 DIAGNOSIS — E1129 Type 2 diabetes mellitus with other diabetic kidney complication: Secondary | ICD-10-CM | POA: Diagnosis not present

## 2018-10-13 DIAGNOSIS — N2581 Secondary hyperparathyroidism of renal origin: Secondary | ICD-10-CM | POA: Diagnosis not present

## 2018-10-15 DIAGNOSIS — N186 End stage renal disease: Secondary | ICD-10-CM | POA: Diagnosis not present

## 2018-10-15 DIAGNOSIS — D631 Anemia in chronic kidney disease: Secondary | ICD-10-CM | POA: Diagnosis not present

## 2018-10-15 DIAGNOSIS — D509 Iron deficiency anemia, unspecified: Secondary | ICD-10-CM | POA: Diagnosis not present

## 2018-10-15 DIAGNOSIS — N2581 Secondary hyperparathyroidism of renal origin: Secondary | ICD-10-CM | POA: Diagnosis not present

## 2018-10-15 DIAGNOSIS — E1129 Type 2 diabetes mellitus with other diabetic kidney complication: Secondary | ICD-10-CM | POA: Diagnosis not present

## 2018-10-16 ENCOUNTER — Encounter: Payer: Medicare Other | Admitting: Physician Assistant

## 2018-10-16 DIAGNOSIS — L98492 Non-pressure chronic ulcer of skin of other sites with fat layer exposed: Secondary | ICD-10-CM | POA: Diagnosis not present

## 2018-10-16 DIAGNOSIS — I120 Hypertensive chronic kidney disease with stage 5 chronic kidney disease or end stage renal disease: Secondary | ICD-10-CM | POA: Diagnosis not present

## 2018-10-16 DIAGNOSIS — M6281 Muscle weakness (generalized): Secondary | ICD-10-CM

## 2018-10-16 DIAGNOSIS — T8189XA Other complications of procedures, not elsewhere classified, initial encounter: Secondary | ICD-10-CM | POA: Diagnosis not present

## 2018-10-16 DIAGNOSIS — Z992 Dependence on renal dialysis: Secondary | ICD-10-CM

## 2018-10-16 DIAGNOSIS — R2689 Other abnormalities of gait and mobility: Secondary | ICD-10-CM | POA: Diagnosis not present

## 2018-10-16 DIAGNOSIS — E1122 Type 2 diabetes mellitus with diabetic chronic kidney disease: Secondary | ICD-10-CM | POA: Diagnosis not present

## 2018-10-16 DIAGNOSIS — N186 End stage renal disease: Secondary | ICD-10-CM

## 2018-10-16 DIAGNOSIS — Z9181 History of falling: Secondary | ICD-10-CM | POA: Diagnosis not present

## 2018-10-16 DIAGNOSIS — E11622 Type 2 diabetes mellitus with other skin ulcer: Secondary | ICD-10-CM | POA: Diagnosis not present

## 2018-10-16 DIAGNOSIS — M6259 Muscle wasting and atrophy, not elsewhere classified, multiple sites: Secondary | ICD-10-CM | POA: Diagnosis not present

## 2018-10-16 DIAGNOSIS — F0390 Unspecified dementia without behavioral disturbance: Secondary | ICD-10-CM | POA: Diagnosis not present

## 2018-10-17 DIAGNOSIS — N186 End stage renal disease: Secondary | ICD-10-CM | POA: Diagnosis not present

## 2018-10-17 DIAGNOSIS — E1129 Type 2 diabetes mellitus with other diabetic kidney complication: Secondary | ICD-10-CM | POA: Diagnosis not present

## 2018-10-17 DIAGNOSIS — N2581 Secondary hyperparathyroidism of renal origin: Secondary | ICD-10-CM | POA: Diagnosis not present

## 2018-10-17 DIAGNOSIS — D509 Iron deficiency anemia, unspecified: Secondary | ICD-10-CM | POA: Diagnosis not present

## 2018-10-17 DIAGNOSIS — D631 Anemia in chronic kidney disease: Secondary | ICD-10-CM | POA: Diagnosis not present

## 2018-10-19 NOTE — Progress Notes (Signed)
Johnny, [name] ([id number]) Visit Report for 10/16/2018 Arrival Information Details Patient Name: [name], [name]. Date of Service: 10/16/2018 3:15 PM Medical Record Number: [id number] Patient Account Number: [id number] Date of Birth/Sex: 1941-07-02 (78 y.o. M) Treating RN: Montey Hora Primary Care Jacek Colson: Tedra Senegal Other Clinician: Referring Amazin Pincock: Tedra Senegal Treating Monty Mccarrell/Extender: [name], HOYT Weeks in Treatment: 5 Visit Information History Since Last Visit Added or deleted any medications: Yes Patient Arrived: Walker Any new allergies or adverse reactions: No Arrival Time: 15:15 Had a fall or experienced change in No Accompanied By: self activities of daily living that may affect Transfer Assistance: None risk of falls: Patient Identification Verified: Yes Signs or symptoms of abuse/neglect since last visito No Secondary Verification Process Completed: Yes Hospitalized since last visit: No Patient Requires Transmission-Based Precautions: No Implantable device outside of the clinic excluding No Patient Has Alerts: No cellular tissue based products placed in the center since last visit: Has Dressing in Place as Prescribed: Yes Pain Present Now: No Electronic Signature(s) Signed: 10/16/2018 4:32:03 PM By: Lorine Bears RCP, RRT, CHT Entered By: Becky Sax, Amado Nash on 10/16/2018 15:16:33 Erich, [name] ([id number]) -------------------------------------------------------------------------------- Encounter Discharge Information Details Patient Name: [name]. Date of Service: 10/16/2018 3:15 PM Medical Record Number: [id number] Patient Account Number: [id number] Date of Birth/Sex: 01-Sep-1941 (78 y.o. M) Treating RN: Montey Hora Primary Care Alen Matheson: Tedra Senegal Other Clinician: Referring Ramal Eckhardt: Tedra Senegal Treating Korrina Zern/Extender: [name], HOYT Weeks in Treatment: 5 Encounter Discharge Information Items Post  Procedure Vitals Discharge Condition: Stable Temperature (F): 97.5 Ambulatory Status: Walker Pulse (bpm): 72 Discharge Destination: Home Respiratory Rate (breaths/min): 16 Transportation: Private Auto Blood Pressure (mmHg): 117/72 Accompanied By: self Schedule Follow-up Appointment: Yes Clinical Summary of Care: Electronic Signature(s) Signed: 10/16/2018 4:53:25 PM By: Montey Hora Entered By: Montey Hora on 10/16/2018 16:53:25 Deutschman, [name] ([id number]) -------------------------------------------------------------------------------- Lower Extremity Assessment Details Patient Name: [name]. Date of Service: 10/16/2018 3:15 PM Medical Record Number: [id number] Patient Account Number: [id number] Date of Birth/Sex: Sep 25, 1940 (78 y.o. M) Treating RN: Secundino Ginger Primary Care Sinjin Amero: Tedra Senegal Other Clinician: Referring Piedad Standiford: Tedra Senegal Treating Samuel Rittenhouse/Extender: [name], HOYT Weeks in Treatment: 5 Electronic Signature(s) Signed: 10/16/2018 4:33:03 PM By: Secundino Ginger Entered By: Secundino Ginger on 10/16/2018 15:32:06 Hobbs, [name] ([id number]) -------------------------------------------------------------------------------- Multi Wound Chart Details Patient Name: [name]. Date of Service: 10/16/2018 3:15 PM Medical Record Number: [id number] Patient Account Number: [id number] Date of Birth/Sex: 01-19-1941 (78 y.o. M) Treating RN: Montey Hora Primary Care Autum Benfer: Tedra Senegal Other Clinician: Referring Monquie Fulgham: Tedra Senegal Treating Natonya Finstad/Extender: [name], HOYT Weeks in Treatment: 5 Vital Signs Height(in): 71 Pulse(bpm): 72 Weight(lbs): 146 Blood Pressure(mmHg): 117/72 Body Mass Index(BMI): 20 Temperature(F): 97.5 Respiratory Rate 16 (breaths/min): Photos: [N/A:N/A] Wound Location: Right Abdomen - Lower N/A N/A Quadrant Wounding Event: Blister N/A N/A Primary Etiology: Abscess N/A N/A Comorbid History: Anemia, Hypertension, N/A  N/A Peripheral Venous Disease, Type II Diabetes, End Stage Renal Disease, Gout, Neuropathy Date Acquired: 08/28/2018 N/A N/A Weeks of Treatment: 5 N/A N/A Wound Status: Open N/A N/A Measurements L x W x D 1.4x1.5x0.1 N/A N/A (cm) Area (cm) : 1.649 N/A N/A Volume (cm) : 0.165 N/A N/A % Reduction in Area: 96.20% N/A N/A % Reduction in Volume: 96.20% N/A N/A Classification: Full Thickness Without N/A N/A Exposed Support Structures Exudate Amount: Small N/A N/A Exudate Type: Serous N/A N/A Exudate Color: amber N/A N/A Wound Margin: Flat and Intact N/A N/A Granulation Amount: Small (1-33%) N/A N/A Granulation Quality: Pink  N/A N/A Necrotic Amount: Large (67-100%) N/A N/A Exposed Structures: Fat Layer (Subcutaneous N/A N/A Tissue) Exposed: Yes Westerman, [name] ([id number]) Fascia: No Tendon: No Muscle: No Joint: No Bone: No Epithelialization: Medium (34-66%) N/A N/A Periwound Skin Texture: Scarring: Yes N/A N/A Excoriation: No Induration: No Callus: No Crepitus: No Rash: No Periwound Skin Moisture: Maceration: No N/A N/A Dry/Scaly: No Periwound Skin Color: Atrophie Blanche: No N/A N/A Cyanosis: No Ecchymosis: No Erythema: No Hemosiderin Staining: No Mottled: No Pallor: No Rubor: No Temperature: No Abnormality N/A N/A Tenderness on Palpation: Yes N/A N/A Wound Preparation: Ulcer Cleansing: N/A N/A Rinsed/Irrigated with Saline Topical Anesthetic Applied: Other: lidocaine 4% Treatment Notes Electronic Signature(s) Signed: 10/16/2018 5:09:04 PM By: Montey Hora Entered By: Montey Hora on 10/16/2018 16:07:00 Lorge, [name] ([id number]) -------------------------------------------------------------------------------- [facility] Details Patient Name: [name]. Date of Service: 10/16/2018 3:15 PM Medical Record Number: [id number] Patient Account Number: [id number] Date of Birth/Sex: February 26, 1941 (78 y.o. M) Treating RN: Montey Hora Primary Care Lorri Fukuhara: Tedra Senegal Other Clinician: Referring Taggert Bozzi: Tedra Senegal Treating Augie Vane/Extender: [name], HOYT Weeks in Treatment: 5 Active Inactive Abuse / Safety / Falls / Self Care Management Nursing Diagnoses: Impaired physical mobility Goals: Patient will remain injury free related to falls Date Initiated: 09/11/2018 Target Resolution Date: 11/29/2018 Goal Status: Active Interventions: Assess fall risk on admission and as needed Notes: Nutrition Nursing Diagnoses: Potential for alteratiion in Nutrition/Potential for imbalanced nutrition Goals: Patient/caregiver agrees to and verbalizes understanding of need to use nutritional supplements and/or vitamins as prescribed Date Initiated: 09/11/2018 Target Resolution Date: 11/29/2018 Goal Status: Active Interventions: Assess patient nutrition upon admission and as needed per policy Notes: Orientation to the Wound Care Program Nursing Diagnoses: Knowledge deficit related to the wound healing center program Goals: Patient/caregiver will verbalize understanding of the [facility] Date Initiated: 09/11/2018 Target Resolution Date: 11/29/2018 Goal Status: Active Interventions: Provide education on orientation to the wound center Causey, [name] ([id number]) Notes: Wound/Skin Impairment Nursing Diagnoses: Impaired tissue integrity Goals: Ulcer/skin breakdown will heal within 14 weeks Date Initiated: 09/11/2018 Target Resolution Date: 11/29/2018 Goal Status: Active Interventions: Assess patient/caregiver ability to obtain necessary supplies Assess patient/caregiver ability to perform ulcer/skin care regimen upon admission and as needed Assess ulceration(s) every visit Notes: Electronic Signature(s) Signed: 10/16/2018 5:09:04 PM By: Montey Hora Entered By: Montey Hora on 10/16/2018 16:06:20 Denk, [name]  ([id number]) -------------------------------------------------------------------------------- Pain Assessment Details Patient Name: [name]. Date of Service: 10/16/2018 3:15 PM Medical Record Number: [id number] Patient Account Number: [id number] Date of Birth/Sex: 11/04/1940 (78 y.o. M) Treating RN: Montey Hora Primary Care Ellese Julius: Tedra Senegal Other Clinician: Referring Akeyla Molden: Tedra Senegal Treating Alandra Sando/Extender: [name], HOYT Weeks in Treatment: 5 Active Problems Location of Pain Severity and Description of Pain Patient Has Paino No Site Locations Pain Management and Medication Current Pain Management: Electronic Signature(s) Signed: 10/16/2018 4:32:03 PM By: Paulla Fore, RRT, CHT Signed: 10/16/2018 5:09:04 PM By: Montey Hora Entered By: Lorine Bears on 10/16/2018 15:16:40 Handlin, [name] ([id number]) -------------------------------------------------------------------------------- Patient/Caregiver Education Details Patient Name: [name]. Date of Service: 10/16/2018 3:15 PM Medical Record Number: [id number] Patient Account Number: [id number] Date of Birth/Gender: 09-27-1940 (78 y.o. M) Treating RN: Montey Hora Primary Care Physician: Tedra Senegal Other Clinician: Referring Physician: Tedra Senegal Treating Physician/Extender: Sharalyn Ink in Treatment: 5 Education Assessment Education Provided To: Patient Education Topics Provided Wound/Skin Impairment: Handouts: Other: wound care to continue as ordered Methods: Demonstration, Explain/Verbal Responses: State content correctly Electronic Signature(s)  Signed: 10/16/2018 5:09:04 PM By: Montey Hora Entered By: Montey Hora on 10/16/2018 16:54:04 Guerette, [name] ([id number]) -------------------------------------------------------------------------------- Wound Assessment Details Patient Name: [name]. Date of Service: 10/16/2018 3:15  PM Medical Record Number: [id number] Patient Account Number: [id number] Date of Birth/Sex: [birth year] (78 y.o. M) Treating RN: Secundino Ginger Primary Care Jazlyn Tippens: Tedra Senegal Other Clinician: Referring Arretta Toenjes: Tedra Senegal Treating Karter Haire/Extender: [name], HOYT Weeks in Treatment: 5 Wound Status Wound Number: 6 Primary Abscess Etiology: Wound Location: Right Abdomen - Lower Quadrant Wound Open Wounding Event: Blister Status: Date Acquired: 08/28/2018 Comorbid Anemia, Hypertension, Peripheral Venous Weeks Of Treatment: 5 History: Disease, Type II Diabetes, End Stage Renal Clustered Wound: No Disease, Gout, Neuropathy Photos Photo Uploaded By: Secundino Ginger on 10/16/2018 15:55:10 Wound Measurements Length: (cm) 1.4 Width: (cm) 1.5 Depth: (cm) 0.1 Area: (cm) 1.649 Volume: (cm) 0.165 % Reduction in Area: 96.2% % Reduction in Volume: 96.2% Epithelialization: Medium (34-66%) Tunneling: No Undermining: No Wound Description Full Thickness Without Exposed Support Classification: Structures Wound Margin: Flat and Intact Exudate Small Amount: Exudate Type: Serous Exudate Color: amber Foul Odor After Cleansing: No Slough/Fibrino Yes Wound Bed Granulation Amount: Small (1-33%) Exposed Structure Granulation Quality: Pink Fascia Exposed: No Necrotic Amount: Large (67-100%) Fat Layer (Subcutaneous Tissue) Exposed: Yes Necrotic Quality: Adherent Slough Tendon Exposed: No Muscle Exposed: No Joint Exposed: No Bone Exposed: No Murren, [name] ([id number]) Periwound Skin Texture Texture Color No Abnormalities Noted: No No Abnormalities Noted: No Callus: No Atrophie Blanche: No Crepitus: No Cyanosis: No Excoriation: No Ecchymosis: No Induration: No Erythema: No Rash: No Hemosiderin Staining: No Scarring: Yes Mottled: No Pallor: No Moisture Rubor: No No Abnormalities Noted: No Dry / Scaly: No Temperature / Pain Maceration: No Temperature: No  Abnormality Tenderness on Palpation: Yes Wound Preparation Ulcer Cleansing: Rinsed/Irrigated with Saline Topical Anesthetic Applied: Other: lidocaine 4%, Treatment Notes Wound #6 (Right Abdomen - Lower Quadrant) Notes Hydrofera Blue and bordered foam dressing Electronic Signature(s) Signed: 10/16/2018 4:33:03 PM By: Secundino Ginger Entered By: Secundino Ginger on 10/16/2018 15:32:58 Bolender, [name] ([id number]) -------------------------------------------------------------------------------- Vitals Details Patient Name: [name]. Date of Service: 10/16/2018 3:15 PM Medical Record Number: [id number] Patient Account Number: [id number] Date of Birth/Sex: 12-14-1940 (78 y.o. M) Treating RN: Montey Hora Primary Care Doristine [name]: Tedra Senegal Other Clinician: Referring Cru Kritikos: Tedra Senegal Treating Jewelene Mairena/Extender: [name], HOYT Weeks in Treatment: 5 Vital Signs Time Taken: 15:17 Temperature (F): 97.5 Height (in): 71 Pulse (bpm): 72 Weight (lbs): 146 Respiratory Rate (breaths/min): 16 Body Mass Index (BMI): 20.4 Blood Pressure (mmHg): 117/72 Reference Range: 80 - 120 mg / dl Airway Electronic Signature(s) Signed: 10/16/2018 4:32:03 PM By: Lorine Bears RCP, RRT, CHT Entered By: Lorine Bears on 10/16/2018 15:23:23

## 2018-10-20 DIAGNOSIS — N2581 Secondary hyperparathyroidism of renal origin: Secondary | ICD-10-CM | POA: Diagnosis not present

## 2018-10-20 DIAGNOSIS — E1129 Type 2 diabetes mellitus with other diabetic kidney complication: Secondary | ICD-10-CM | POA: Diagnosis not present

## 2018-10-20 DIAGNOSIS — D631 Anemia in chronic kidney disease: Secondary | ICD-10-CM | POA: Diagnosis not present

## 2018-10-20 DIAGNOSIS — D509 Iron deficiency anemia, unspecified: Secondary | ICD-10-CM | POA: Diagnosis not present

## 2018-10-20 DIAGNOSIS — N186 End stage renal disease: Secondary | ICD-10-CM | POA: Diagnosis not present

## 2018-10-21 ENCOUNTER — Other Ambulatory Visit: Payer: Self-pay

## 2018-10-21 ENCOUNTER — Telehealth: Payer: Self-pay

## 2018-10-21 MED ORDER — FREESTYLE FREEDOM LITE W/DEVICE KIT: PACK | 0 refills | Status: DC

## 2018-10-21 NOTE — Telephone Encounter (Signed)
Called pt's wife and left voicemail requesting a call back.

## 2018-10-21 NOTE — Telephone Encounter (Signed)
[name] called and stated that pt's blood sugars have consistenly been 110 in the mornings, and 240 in the evening for the past week or so. About 7 days ago, she started giving patient his Tradjenta at noon as well as giving him another tablet of Tradjenta in the evening and his blood sugars in the am have been fine.  [name] would like to know if she should continue giving him the 2 tablets of Tradjenta in the am, or make some sort of other changed to make sure his blood sugars are not so high in the evenings.

## 2018-10-21 NOTE — Telephone Encounter (Signed)
Tradjenta cannot be taken twice a day.  If he is having a normal appetite we can have him go back to Victoza 0.6 mg daily instead of Tradjenta.  Otherwise need to know if his evening blood sugars are only high after dinner

## 2018-10-21 NOTE — Progress Notes (Signed)
Johnny, [name] ([id number]) Visit Report for 10/16/2018 Chief Complaint Document Details Patient Name: [name], [name]. Date of Service: 10/16/2018 3:15 PM Medical Record Number: [id number] Patient Account Number: [id number] Date of Birth/Sex: 1941-04-16 (78 y.o. M) Treating RN: Montey Hora Primary Care Provider: Tedra Senegal Other Clinician: Referring Provider: Tedra Senegal Treating Provider/Extender: [name], HOYT Weeks in Treatment: 5 Information Obtained from: Patient Chief Complaint Abdominal ulcers Electronic Signature(s) Signed: 10/18/2018 2:36:46 AM By: Worthy Keeler PA-C Entered By: Worthy Keeler on 10/16/2018 15:18:28 Meador, [name] ([id number]) -------------------------------------------------------------------------------- Debridement Details Patient Name: [name]. Date of Service: 10/16/2018 3:15 PM Medical Record Number: [id number] Patient Account Number: [id number] Date of Birth/Sex: 12/14/1940 (77 y.o. M) Treating RN: Montey Hora Primary Care Provider: Tedra Senegal Other Clinician: Referring Provider: Tedra Senegal Treating Provider/Extender: [name], HOYT Weeks in Treatment: 5 Debridement Performed for Wound #6 Right Abdomen - Lower Quadrant Assessment: Performed By: Physician STONE III, HOYT E., PA-C Debridement Type: Debridement Level of Consciousness (Pre- Awake and Alert procedure): Pre-procedure Verification/Time Yes - 16:07 Out Taken: Start Time: 16:07 Pain Control: Lidocaine 4% Topical Solution Total Area Debrided (L x W): 1.4 (cm) x 1.5 (cm) = 2.1 (cm) Tissue and other material Viable, Non-Viable, Slough, Subcutaneous, Slough debrided: Level: Skin/Subcutaneous Tissue Debridement Description: Excisional Instrument: Curette Bleeding: Minimum Hemostasis Achieved: Pressure End Time: 16:09 Procedural Pain: 0 Post Procedural Pain: 0 Response to Treatment: Procedure was tolerated well Level of Consciousness Awake and  Alert (Post-procedure): Post Debridement Measurements of Total Wound Length: (cm) 1.4 Width: (cm) 1.5 Depth: (cm) 0.2 Volume: (cm) 0.33 Character of Wound/Ulcer Post Debridement: Improved Post Procedure Diagnosis Same as Pre-procedure Electronic Signature(s) Signed: 10/16/2018 5:09:04 PM By: Montey Hora Signed: 10/18/2018 2:36:46 AM By: Worthy Keeler PA-C Entered By: Montey Hora on 10/16/2018 16:09:19 Lecker, [name] ([id number]) -------------------------------------------------------------------------------- HPI Details Patient Name: [name]. Date of Service: 10/16/2018 3:15 PM Medical Record Number: [id number] Patient Account Number: [id number] Date of Birth/Sex: [birth year] (78 y.o. M) Treating RN: Montey Hora Primary Care Provider: Tedra Senegal Other Clinician: Referring Provider: Tedra Senegal Treating Provider/Extender: [name], HOYT Weeks in Treatment: 5 History of Present Illness HPI Description: 78 year old gentleman seen earlier this year in January for a abdominal wound is now back with the same problem which has recurred for about 2 weeks and he has been trying to apply some local antibiotic ointment and a Band-Aid there. Of note he has significant pressure in this area due to his beltline and may have put on some weight which results in his trousers being very tight around the waist. The else has changed in his HandP and his diabetes is pretty well controlled. most recent hemoglobin A1c on 12/06/2016 was 7.8%. he was reviewed by his endocrinologist Dr. Dwyane Dee who made appropriate modifications to his treatment plan. he was recently seen by neurology for mild cognitive impairment that was found to be normal for his age 78/21/18 patient appears to be doing well in regard to his abdominal wound area on evaluation today. He has been tolerating the dressing changes with the [facility] Dressing I'm pleased with how this has progressed and in fact the  wound appears to be completely healed on evaluation today. There is definitely no evidence of infection. 03/29/17 Patient's wound on the abdominal region appears to be completely healed on evaluation today. This wound has been doing very well and I think the biggest thing is going to be keeping it from reactivation where it rubbed on his pants.  04/18/2017 -- he was recently discharged about 3 weeks ago and was using a binder which probably caused him abrasion on the area of the recently healed scar. 05/02/17 on evaluation today patient appears to be doing fairly well in regard to his abdominal wound at the site of the surgical scar. Unfortunately the scar tissue is very fragile and tends to reopen. He did get a small abdominal binder to try to pad the area in order to offload and prevent his pants from rubbing specifically at the site. With that being said he tells me that Dr. Con Memos was concerned that the binder might actually have rub the area causing a reopening. I did have a look at this today and unfortunately it appears that he was putting this over his pants which was making matters worse. 05/09/17 on evaluation today patient continues to do well with the [facility] Dressing. There is a slight skin tear inferior to the wound that we have been treating that may have been due to the dressing sticking slightly. This is minimal however. I did discuss with patient prior to removal of the dressing for dressing changes that they whet this in order to loosen it and not risk damaging in the field. Nonetheless it appears that they have been doing very well with the dressing changes he had his wife took over the dressing changes as she states that he was doing it wrong. Overall though I'm pleased with how things are progressing. No fevers, chills, nausea, or vomiting noted at this time. Readmission: 08/27/17 on evaluation today patient appears to be doing similarly to how he was doing prior to healing  previously August 2018 when we previously discharged him. He has fortunately been doing well up until just a couple of weeks ago and he had some of the [facility] Dressing over so he began to treat this on his own. He states that this has gotten much smaller but he ran out of supplies and therefore made an appointment to come back in for evaluation. No fevers, chills, nausea, or vomiting noted at this time. He has no discomfort at this point in regard to the wound. He does tell me that he has one maybe two compression fractures in his lower back and that he is going to be seen by the physician at [facility] imaging, Dr. Estanislado Pandy. ====== Old notes The 78 year old gentleman who has a past medical history of diabetes mellitus, end-stage renal disease on dialysis, hypertension, secondary hyperparathyroidism, peripheral vascular disease and history of previous arterial bifemoral bypass graft in 1992 also has a history of gout, hyperlipidemia, hypothyroidism. last hemoglobin A1c was 7.5%. status post AV fistula Virella, ASHON ROSENBERG. ([id number]) placement, breast surgery for left granulomatous mastitis, triple a repair in 1992, small bowel obstruction, upper arm angiograms for AV fistula on the left side. was noted to have a superficial skin wound in the periumbilical area since late December 2017. He was asked to clean it with hydrogen peroxide and apply Bactroban. He was seen by vascular surgery in October 2017 where his right ABI was 0.56 left ABI was 0.87 and right TBI was 0.69 and left TBI was 0.70 ===== 09/25/16; patient was previously a patient of Dr. Ardeen Garland who has recurrent skin breakdown in the area of the large incision that was initially 4 apparently an open AAA aneurysm repair. He had secondary surgery but I'm not sure of what etiology this was. He is been using Hydrofera Blue to the open wounds in the mid  abdominal surgical incision. This is now closed over. I think he also uses this  area as his waist for his pants and I've asked him to adjust this Readmission: 02/11/18 on evaluation today patient presents for evaluation concerning a sacral pressure ulcer which has been present for several weeks. He states that he actually had several areas that were open there appears just be one remaining. He has been tolerating the dressing changes is wise been using Hydrofera Blue Dressing as left over from his abdominal ulcer that we have previously treated. Fortunately there does not appear to be any evidence of infection which is good news. With that being said he states that he's been having issues at one point even at dialysis he had to discontinue dialysis and go home due to the pain. He has not been using any cushion to sit on although he states he does have one that he can use. No fevers, chills, nausea, or vomiting noted at this time. He had a significant issue with diarrhea prior to this occurring and I believe this may have had some contributing factor as well. 02/18/18 on evaluation today patient's wound actually appears to be doing about the same. Unfortunately he really has not noted any significant improvement over last week he still is very tender even just to light touch unfortunately over the periwound region. There does not appear to be any evidence of infection which is good news. He is especially tender at the 12 o'clock location even a full fingers with away from the actual wound site. There still slough covering the surface of the wound. 03/04/18 on evaluation today patient's wound actually appears to show signs of great improvement compared to last week's evaluation. He is definitely showing improvement week by week which I think is great news. Obviously that is what we are looking for. Nonetheless I did not even need to perform any sharp debridement today being that there was no slough noted on the surface of the wound. His blood pressure continues to remain low  although this is something that is normal. That is for him. 03/18/18 on evaluation today patient's wound actually appears to potentially be completely healed although I cannot tell for sure. He has a small indeed very small eschar noted over the wound surface which also was measuring smaller than what it was previous. With that being said he has discomfort to the point that I'm not really able to do much with this in order to remove the eschar and prove that everything is completely healed underneath. Nonetheless it's starting to look like this may lift up on its own and I think that may be the best course for him currently. 04/01/18 on evaluation today patient appears to be doing very well in fact his wound is completely healed today. He's been tolerating the dressing changes without complication. With that being said I do believe at this point that he is ready for discharge she still having some discomfort deeper and he tells me this may just be some irritation from the long periods of time he has to sit for dialysis nonetheless he's not having the redness that was previously noted I think he has done very well trying to keep pressure off of this area. Readmission: 09/11/18 on evaluation today patient presents for reevaluation here in our clinic due to a remission with ulcers of the abdominal area. This is a region that he has previously been treated for bar clinic as well we were able to get  this healed. 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 He states he was in the hospital when this just suddenly came up toward the end of his hospital stay. There really is nothing else currently going on indicating more significant issue at this point. Fortunately No fevers, chills, nausea, or vomiting noted at this time. The patient does have diabetes, is on dialysis, and states that the area does tend to hurt but only near the bottom of  the wound though this is rather painful. 10/02/18 on evaluation today patient's abdominal ulcer is actually appear to be doing excellent at this point. He's been tolerating the dressing changes without complication. I do believe that this is doing very well for him. No fevers, chills, nausea, or Nishikawa, AJAX SCHROLL. ([id number]) vomiting noted at this time. 10/09/18 on evaluation today patient appears to be doing very well in regard to his abdominal ulcers. In fact one area is almost completely healed. The other is shown signs of good epithelialization at this point. Overall I see no signs of infection and things seem to be progressing nicely. 10/16/18 on evaluation today patient appears to be doing well in regard to his abdominal ulcers. He has been tolerating the dressing changes without complication. Fortunately there is no signs of infection at this time. Overall he seems to be doing very well. Electronic Signature(s) Signed: 10/18/2018 2:36:46 AM By: Worthy Keeler PA-C Entered By: Worthy Keeler on 10/18/2018 02:17:04 Cue, [name] ([id number]) -------------------------------------------------------------------------------- Physical Exam Details Patient Name: [name]. Date of Service: 10/16/2018 3:15 PM Medical Record Number: [id number] Patient Account Number: [id number] Date of Birth/Sex: January 21, 1941 (77 y.o. M) Treating RN: Montey Hora Primary Care Provider: Tedra Senegal Other Clinician: Referring Provider: Tedra Senegal Treating Provider/Extender: [name], HOYT Weeks in Treatment: 5 Constitutional Well-nourished and well-hydrated in no acute distress. Respiratory normal breathing without difficulty. clear to auscultation bilaterally. Cardiovascular regular rate and rhythm with normal S1, S2. Psychiatric this patient is able to make decisions and demonstrates good insight into disease process. Alert and Oriented x 3. pleasant and cooperative. Notes Upon inspection today  patient's wound bed actually shows good granulation and epithelialization. In fact one of the areas north-central has completely closed. Overall I'm very pleased with how things are progressing. Electronic Signature(s) Signed: 10/18/2018 2:36:46 AM By: Worthy Keeler PA-C Entered By: Worthy Keeler on 10/18/2018 02:17:43 Barre, [name] ([id number]) -------------------------------------------------------------------------------- Physician Orders Details Patient Name: [name]. Date of Service: 10/16/2018 3:15 PM Medical Record Number: [id number] Patient Account Number: [id number] Date of Birth/Sex: 1940-11-12 (77 y.o. 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 Anesthetic (add to Medication List) Wound #6 Right Abdomen - Lower Quadrant o Topical Lidocaine 4% cream applied to wound bed prior to debridement (In Clinic Only). Primary Wound Dressing Wound #6 Right Abdomen - Lower Quadrant o Hydrafera Blue Ready Transfer Secondary Dressing Wound #6 Right Abdomen - Lower Quadrant o Boardered Foam Dressing Dressing Change Frequency Wound #6 Right Abdomen - Lower Quadrant   o Change Dressing Monday, Wednesday, Friday Follow-up Appointments Wound #6 Right Abdomen - Lower Quadrant o Return Appointment in 1 week. Home Health Wound #6 Right Abdomen - [place] Visits o Home Health Nurse may visit PRN to address patientos wound  care needs. o FACE TO FACE ENCOUNTER: MEDICARE and MEDICAID PATIENTS: I certify that this patient is under my care and that I had a face-to-face encounter that meets the physician face-to-face encounter requirements with this patient on this date. The encounter with the patient was in whole or in part for the following MEDICAL CONDITION: (primary reason for [place]) MEDICAL NECESSITY: I certify, that based on my findings, NURSING services are a medically necessary home health service. HOME BOUND STATUS: I certify that my clinical findings support that this patient is homebound (i.e., Due to illness or injury, pt requires aid of Clausing, SHEAMUS HASTING ([id number]) supportive devices such as crutches, cane, wheelchairs, walkers, the use of special transportation or the assistance of another person to leave their place of residence. There is a normal inability to leave the home and doing so requires considerable and taxing effort. Other absences are for medical reasons / religious services and are infrequent or of short duration when for other reasons). o If current dressing causes regression in wound condition, may D/C ordered dressing product/s and apply Normal Saline Moist Dressing daily until next [place] / Other MD appointment. [place] of regression in wound condition at [phone]. o Please direct any NON-WOUND related issues/requests for orders to patient's Primary Care Physician Electronic Signature(s) Signed: 10/16/2018 5:09:04 PM By: Montey Hora Signed: 10/18/2018 2:36:46 AM By: Worthy Keeler PA-C Entered By: Montey Hora on 10/16/2018 16:09:34 Flammia, [name] ([id number]) -------------------------------------------------------------------------------- Problem List Details Patient Name: [name]. Date of Service: 10/16/2018 3:15 PM Medical Record Number: [id number] Patient Account Number: [id number] Date of Birth/Sex: [birth year] (77  y.o. 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 Date of Service: 10/16/2018 3:15 PM Medical Record Number: [id number] Patient Account Number: [id number] Date of Birth/Sex: 1941/01/09 (77 y.o. M) Treating RN: Montey Hora Primary Care Provider: Tedra Senegal Other Clinician: Referring Provider: Tedra Senegal Treating Provider/Extender: [name], HOYT Weeks in Treatment: 5 Subjective Chief Complaint Information obtained from Patient Abdominal ulcers History of Present Illness (HPI) 78 year old gentleman seen earlier this year in January for a abdominal wound is now back with the same problem which has recurred for about 2 weeks and he has been trying to apply some local antibiotic ointment and a Band-Aid there. Of note he has significant pressure in this area due to his beltline and may have put on some weight which results in his trousers being very tight around the waist. The else has changed in his HandP and his diabetes is pretty well controlled. most recent hemoglobin A1c on 12/06/2016 was  7.8%. he was reviewed by his endocrinologist Dr. Dwyane Dee  who made appropriate modifications to his treatment plan. he was recently seen by neurology for mild cognitive impairment that was found to be normal for his age 98/21/18 patient appears to be doing well in regard to his abdominal wound area on evaluation today. He has been tolerating the dressing changes with the [facility] Dressing I'm pleased with how this has progressed and in fact the wound appears to be completely healed on evaluation today. There is definitely no evidence of infection. 03/29/17 Patient's wound on the abdominal region appears to be completely healed on evaluation today. This wound has been doing very well and I think the biggest thing is going to be keeping it from reactivation where it rubbed on his pants. 04/18/2017 -- he was recently discharged about 3 weeks ago and was using a binder which probably caused him abrasion on the area of the recently healed scar. 05/02/17 on evaluation today patient appears to be doing fairly well in regard to his abdominal wound at the site of the surgical scar. Unfortunately the scar tissue is very fragile and tends to reopen. He did get a small abdominal binder to try to pad the area in order to offload and prevent his pants from rubbing specifically at the site. With that being said he tells me that Dr. Con Memos was concerned that the binder might actually have rub the area causing a reopening. I did have a look at this today and unfortunately it appears that he was putting this over his pants which was making matters worse. 05/09/17 on evaluation today patient continues to do well with the [facility] Dressing. There is a slight skin tear inferior to the wound that we have been treating that may have been due to the dressing sticking slightly. This is minimal however. I did discuss with patient prior to removal of the dressing for dressing changes that they whet this in order to loosen it and not risk damaging in the field. Nonetheless it  appears that they have been doing very well with the dressing changes he had his wife took over the dressing changes as she states that he was doing it wrong. Overall though I'm pleased with how things are progressing. No fevers, chills, nausea, or vomiting noted at this time. Readmission: 08/27/17 on evaluation today patient appears to be doing similarly to how he was doing prior to healing previously August 2018 when we previously discharged him. He has fortunately been doing well up until just a couple of weeks ago and he had some of the [facility] Dressing over so he began to treat this on his own. He states that this has gotten much smaller but he ran out of supplies and therefore made an appointment to come back in for evaluation. No fevers, chills, nausea, or vomiting noted at this time. He has no discomfort at this point in regard to the wound. He does tell me that he has one maybe two compression fractures in his lower back and that he is going to be seen by the physician at [facility] imaging, Dr. Estanislado Pandy. Slovacek, TEION BALLIN ([id number]) ====== Old notes The 78 year old gentleman who has a past medical history of diabetes mellitus, end-stage renal disease on dialysis, hypertension, secondary hyperparathyroidism, peripheral vascular disease and history of previous arterial bifemoral bypass graft in 1992 also has a history of gout, hyperlipidemia, hypothyroidism. last hemoglobin A1c was 7.5%. status post AV fistula placement, breast surgery for left  granulomatous mastitis, triple a repair in 1992, small bowel obstruction, upper arm angiograms for AV fistula on the left side. was noted to have a superficial skin wound in the periumbilical area since late December 2017. He was asked to clean it with hydrogen peroxide and apply Bactroban. He was seen by vascular surgery in October 2017 where his right ABI was 0.56 left ABI was 0.87 and right TBI was 0.69 and left TBI was  0.70 ===== 09/25/16; patient was previously a patient of Dr. Ardeen Garland who has recurrent skin breakdown in the area of the large incision that was initially 4 apparently an open AAA aneurysm repair. He had secondary surgery but I'm not sure of what etiology this was. He is been using Hydrofera Blue to the open wounds in the mid abdominal surgical incision. This is now closed over. I think he also uses this area as his waist for his pants and I've asked him to adjust this Readmission: 02/11/18 on evaluation today patient presents for evaluation concerning a sacral pressure ulcer which has been present for several weeks. He states that he actually had several areas that were open there appears just be one remaining. He has been tolerating the dressing changes is wise been using Hydrofera Blue Dressing as left over from his abdominal ulcer that we have previously treated. Fortunately there does not appear to be any evidence of infection which is good news. With that being said he states that he's been having issues at one point even at dialysis he had to discontinue dialysis and go home due to the pain. He has not been using any cushion to sit on although he states he does have one that he can use. No fevers, chills, nausea, or vomiting noted at this time. He had a significant issue with diarrhea prior to this occurring and I believe this may have had some contributing factor as well. 02/18/18 on evaluation today patient's wound actually appears to be doing about the same. Unfortunately he really has not noted any significant improvement over last week he still is very tender even just to light touch unfortunately over the periwound region. There does not appear to be any evidence of infection which is good news. He is especially tender at the 12 o'clock location even a full fingers with away from the actual wound site. There still slough covering the surface of the wound. 03/04/18 on evaluation today  patient's wound actually appears to show signs of great improvement compared to last week's evaluation. He is definitely showing improvement week by week which I think is great news. Obviously that is what we are looking for. Nonetheless I did not even need to perform any sharp debridement today being that there was no slough noted on the surface of the wound. His blood pressure continues to remain low although this is something that is normal. That is for him. 03/18/18 on evaluation today patient's wound actually appears to potentially be completely healed although I cannot tell for sure. He has a small indeed very small eschar noted over the wound surface which also was measuring smaller than what it was previous. With that being said he has discomfort to the point that I'm not really able to do much with this in order to remove the eschar and prove that everything is completely healed underneath. Nonetheless it's starting to look like this may lift up on its own and I think that may be the best course for him currently. 04/01/18 on evaluation today  patient appears to be doing very well in fact his wound is completely healed today. He's been tolerating the dressing changes without complication. With that being said I do believe at this point that he is ready for discharge she still having some discomfort deeper and he tells me this may just be some irritation from the long periods of time he has to sit for dialysis nonetheless he's not having the redness that was previously noted I think he has done very well trying to keep pressure off of this area. Readmission: 09/11/18 on evaluation today patient presents for reevaluation here in our clinic due to a remission with ulcers of the abdominal area. This is a region that he has previously been treated for bar clinic as well we were able to get this healed. 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 He states he was in the hospital when this just suddenly came up toward the end of his hospital stay. There really is nothing else currently going on indicating more significant issue at this point. Fortunately No fevers, chills, nausea, or vomiting noted at this time. Petit, JRAKE RODRIQUEZ ([id number]) The patient does have diabetes, is on dialysis, and states that the area does tend to hurt but only near the bottom of the wound though this is rather painful. 10/02/18 on evaluation today patient's abdominal ulcer is actually appear to be doing excellent at this point. He's been tolerating the dressing changes without complication. I do believe that this is doing very well for him. No fevers, chills, nausea, or vomiting noted at this time. 10/09/18 on evaluation today patient appears to be doing very well in regard to his abdominal ulcers. In fact one area is almost completely healed. The other is shown signs of good epithelialization at this point. Overall I see no signs of infection and things seem to be progressing nicely. 10/16/18 on evaluation today patient appears to be doing well in regard to his abdominal ulcers. He has been tolerating the dressing changes without complication. Fortunately there is no signs of infection at this time. Overall he seems to be doing very well. Patient History Information obtained from Patient. Family History Diabetes - Father,Paternal Grandparents, Heart Disease - Father, Hypertension - Father, No family history of Cancer, Hereditary Spherocytosis, Kidney Disease, Lung Disease, Seizures, Stroke, Thyroid Problems, Tuberculosis. Social History Former smoker - quit in 1996, Marital Status - Married, Alcohol Use - Never - quit in 1976, Drug Use - No History, Caffeine Use - Never. 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 Respiratory The patient has no complaints or symptoms. Cardiovascular The patient has no complaints or symptoms. Psychiatric The patient has no complaints or symptoms. Objective Constitutional Well-nourished and well-hydrated in no acute distress. Vitals Time Taken: 3:17 PM, Height: 71 in, Weight: 146 lbs, BMI: 20.4, Temperature: 97.5 F, Pulse: 72 bpm, Respiratory Rate: 16 breaths/min, Blood Pressure: 117/72 mmHg. Salih, TERREN JANDREAU ([id number]) Respiratory normal breathing without difficulty. clear to auscultation bilaterally. Cardiovascular regular rate and rhythm with normal S1, S2. Psychiatric this patient is able to make decisions and demonstrates good insight into disease process. Alert and Oriented x 3. pleasant and cooperative. General Notes: Upon inspection today patient's wound bed actually shows good granulation and epithelialization. In fact one of the areas north-central has completely closed. Overall  I'm very pleased with how things are progressing. Integumentary (Hair, Skin) Wound #6 status is Open. Original cause of wound was Blister. The wound is located on the Right Abdomen - Lower Quadrant. The wound measures 1.4cm length x 1.5cm width x 0.1cm depth; 1.649cm^2 area and 0.165cm^3 volume. There is Fat Layer (Subcutaneous Tissue) Exposed exposed. There is no tunneling or undermining noted. There is a small amount of serous drainage noted. The wound margin is flat and intact. There is small (1-33%) pink granulation within the wound bed. There is a large (67-100%) amount of necrotic tissue within the wound bed including Adherent Slough. The periwound skin appearance exhibited: Scarring. The periwound skin appearance did not exhibit: Callus, Crepitus, Excoriation, Induration, Rash, Dry/Scaly, Maceration, Atrophie Blanche, Cyanosis, Ecchymosis, Hemosiderin Staining, Mottled,  Pallor, Rubor, Erythema. Periwound temperature was noted as No Abnormality. The periwound has tenderness on palpation. 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 There was a Excisional Skin/Subcutaneous Tissue Debridement with a total area of 2.1 sq cm performed by STONE III, HOYT E., PA-C. With the following instrument(s): Curette to remove Viable and Non-Viable tissue/material. Material removed includes Subcutaneous Tissue and Slough and after achieving pain control using Lidocaine 4% Topical Solution. No specimens were taken. A time out was conducted at 16:07, prior to the start of the procedure. A Minimum amount of bleeding was controlled with Pressure. The procedure was tolerated well with a pain level of 0 throughout and a pain level of 0 following the procedure. Post Debridement Measurements: 1.4cm length x 1.5cm width x 0.2cm depth; 0.33cm^3 volume. Character of Wound/Ulcer Post Debridement is improved. Post procedure Diagnosis Wound #6: Same as Pre-Procedure Rabbani, [name] ([id number]) Plan Wound Cleansing: Wound #6 Right Abdomen - Lower Quadrant: Clean wound with Normal Saline. Anesthetic (add to Medication List): Wound #6 Right Abdomen - Lower Quadrant: Topical Lidocaine 4% cream applied to wound bed prior to debridement (In Clinic Only). Primary Wound Dressing: Wound #6 Right Abdomen - Lower Quadrant: Hydrafera Blue Ready Transfer Secondary Dressing: Wound #6 Right Abdomen - Lower Quadrant: Boardered Foam Dressing Dressing Change Frequency: Wound #6 Right Abdomen - Lower Quadrant: Change Dressing Monday, Wednesday, Friday Follow-up Appointments: Wound #6 Right Abdomen - Lower  Quadrant: Return Appointment in 1 week. Home Health: Wound #6 Right Abdomen - Lower Quadrant: [place] Nurse may visit PRN to address patient s wound care needs. FACE TO FACE ENCOUNTER: MEDICARE and MEDICAID PATIENTS: I certify that this patient is under my care and that I had a face-to-face encounter that meets the physician face-to-face encounter requirements with this patient on this date. The encounter with the patient was in whole or in part for the following MEDICAL CONDITION: (primary reason for [place]) MEDICAL NECESSITY: I certify, that based on my findings, NURSING services are a medically necessary home health service. HOME BOUND STATUS: I certify that my clinical findings support that this patient is homebound (i.e., Due to illness or injury, pt requires aid of supportive devices such as crutches, cane, wheelchairs, walkers, the use of special transportation or the assistance of another person to leave their place of residence. There is a normal inability to leave the home and doing so requires considerable and taxing effort. Other  absences are for medical reasons / religious services and are infrequent or of short duration when for other reasons). If current dressing causes regression in wound condition, may D/C ordered dressing product/s and apply Normal Saline Moist Dressing daily until next [place] / Other MD appointment. [place] of regression in wound condition at [phone]. Please direct any NON-WOUND related issues/requests for orders to patient's Primary Care Physician I'm in a recommend that we continue with the above wound care measures. The patient is in agreement the plan. Subsequently we're gonna see were things stand at follow-up. Please see above for specific wound care orders. We will see patient for re-evaluation in 1 week(s) here in the clinic. If anything worsens or changes patient will  contact our office for additional recommendations. Electronic Signature(s) Signed: 10/18/2018 2:36:46 AM By: Worthy Keeler PA-C Entered By: Worthy Keeler on 10/18/2018 02:17:59 Ma, [name] ([id number]) -------------------------------------------------------------------------------- ROS/PFSH Details Patient Name: [name]. Date of Service: 10/16/2018 3:15 PM Medical Record Number: [id number] Patient Account Number: [id number] Date of Birth/Sex: November 28, 1940 (77 y.o. M) Treating RN: Montey Hora Primary Care Provider: Tedra Senegal Other Clinician: Referring Provider: Tedra Senegal Treating Provider/Extender: [name], HOYT Weeks in Treatment: 5 Information Obtained From Patient Wound History Do you currently have one or more open woundso Yes How many open wounds do you currently haveo 2 Approximately how long have you had your woundso 2 weeks How have you been treating your wound(s) until nowo zinc ,drsg Has your wound(s) ever healed and then re-openedo No Have you had any lab work done in the past montho Yes Who ordered the lab work doneo [facility] doctor [facility] Have you tested positive for an antibiotic resistant organism (MRSA, VRE)o No Have you tested positive for osteomyelitis (bone infection)o No Have you had any tests for circulation on your legso No Constitutional Symptoms (General Health) Complaints and Symptoms: Negative for: Fever; Chills Eyes Medical History: Negative for: Cataracts; Glaucoma; Optic Neuritis Ear/Nose/Mouth/Throat Medical History: Negative for: Chronic sinus problems/congestion; Middle ear problems Hematologic/Lymphatic Medical History: Positive for: Anemia Negative for: Hemophilia; Human Immunodeficiency Virus; Lymphedema; Sickle Cell Disease Respiratory Complaints and Symptoms: No Complaints or Symptoms Cardiovascular Complaints and Symptoms: No Complaints or Symptoms Medical History: Positive for: Hypertension; Peripheral Venous  Disease Negative for: Angina; Arrhythmia; Congestive Heart Failure; Coronary Artery Disease; Deep Vein Thrombosis; Hypotension; Myocardial Infarction; Peripheral Arterial Disease; Phlebitis; Vasculitis Pinedo, GARRON ELINE ([id number]) Past Medical History Notes: artificial valve, hyperlipidemia Gastrointestinal Medical History: Negative for: Cirrhosis ; Colitis; Crohnos; Hepatitis A; Hepatitis B; Hepatitis C Past Medical History Notes: scar tissue from multiple surgeries Endocrine Medical History: Positive for: Type II Diabetes Negative for: Type I Diabetes Treated with: Oral agents Genitourinary Medical History: 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 Immunological Medical History: Negative for: Lupus Erythematosus; Raynaudos; Scleroderma Integumentary (Skin) Medical History: Negative for: History of Burn; History of pressure wounds Musculoskeletal Medical History: Positive for: Gout Negative for: Rheumatoid Arthritis; Osteoarthritis; Osteomyelitis Neurologic Medical History: Positive for: Neuropathy Negative for: Dementia; Quadriplegia; Paraplegia; Seizure Disorder Oncologic Medical History: Negative for: Received Chemotherapy; Received Radiation Psychiatric Complaints and Symptoms: No Complaints or Symptoms Medical History: Negative for: Anorexia/bulimia; Confinement Anxiety Immunizations Arseneau, [name] ([id number]) Pneumococcal Vaccine: Received Pneumococcal Vaccination: Yes Immunization Notes: uo to date Implantable Devices Family and Social History Cancer: No; Diabetes: Yes - Father,Paternal Grandparents; Heart Disease: Yes - Father; Hereditary Spherocytosis: No; Hypertension: Yes - Father; Kidney Disease: No; Lung Disease: No; Seizures: No; Stroke: No;  Thyroid Problems: No; Tuberculosis: No; Former smoker - quit in 1996; Marital Status - Married; Alcohol Use: Never - quit in 1976; Drug Use: No History; Caffeine Use: Never; Financial Concerns: No;  Food, Clothing or Shelter Needs: No; Support System Lacking: No; Transportation Concerns: No; Advanced Directives: No; Patient does not want information on Advanced Directives Physician Affirmation I have reviewed and agree with the above information. Electronic Signature(s) Signed: 10/18/2018 2:36:46 AM By: Worthy Keeler PA-C Signed: 10/20/2018 4:38:40 PM By: Montey Hora Entered By: Worthy Keeler on 10/18/2018 02:17:31 Lazarz, [name] ([id number]) -------------------------------------------------------------------------------- SuperBill Details Patient Name: [name]. Date of Service: 10/16/2018 Medical Record Number: [id number] Patient Account Number: [id number] Date of Birth/Sex: Apr 23, 1941 (78 y.o. M) Treating RN: Montey Hora Primary Care Provider: Tedra Senegal Other Clinician: Referring Provider: Tedra Senegal Treating Provider/Extender: [name], HOYT Weeks in Treatment: 5 Diagnosis Coding ICD-10 Codes Code Description E83.59 Other disorders of calcium metabolism L98.492 Non-pressure chronic ulcer of skin of other sites with fat layer exposed E11.622 Type 2 diabetes mellitus with other skin ulcer Z99.2 Dependence on renal dialysis Facility Procedures CPT4 Code: 57903833 Description: 38329 - DEB SUBQ TISSUE 20 SQ CM/< ICD-10 Diagnosis Description L98.492 Non-pressure chronic ulcer of skin of other sites with fat l Modifier: ayer exposed Quantity: 1 Physician Procedures CPT4 Code: 1916606 Description: 00459 - WC PHYS SUBQ TISS 20 SQ CM ICD-10 Diagnosis Description L98.492 Non-pressure chronic ulcer of skin of other sites with fat la Modifier: yer exposed Quantity: 1 Electronic Signature(s) Signed: 10/18/2018 2:36:46 AM By: Worthy Keeler PA-C Entered By: Worthy Keeler on 10/18/2018 02:18:06

## 2018-10-22 ENCOUNTER — Other Ambulatory Visit: Payer: Self-pay

## 2018-10-22 DIAGNOSIS — E1129 Type 2 diabetes mellitus with other diabetic kidney complication: Secondary | ICD-10-CM | POA: Diagnosis not present

## 2018-10-22 DIAGNOSIS — N186 End stage renal disease: Secondary | ICD-10-CM | POA: Diagnosis not present

## 2018-10-22 DIAGNOSIS — N2581 Secondary hyperparathyroidism of renal origin: Secondary | ICD-10-CM | POA: Diagnosis not present

## 2018-10-22 DIAGNOSIS — D631 Anemia in chronic kidney disease: Secondary | ICD-10-CM | POA: Diagnosis not present

## 2018-10-22 DIAGNOSIS — D509 Iron deficiency anemia, unspecified: Secondary | ICD-10-CM | POA: Diagnosis not present

## 2018-10-22 MED ORDER — REPAGLINIDE 0.5 MG PO TABS: 0.5000 mg | ORAL_TABLET | Freq: Every day | ORAL | 3 refills | Status: DC

## 2018-10-22 NOTE — Telephone Encounter (Signed)
lft detailed vm with call back # for questions

## 2018-10-22 NOTE — Telephone Encounter (Signed)
Called pt's wife and informed her of MD message. She stated that his blood sugars are high even before dinner, not just after. [name] stated that taking the Tradjenta was easier for them and that she would prefer to continue on this, rather than go back on Victoza. Wife was informed to not give patient more than one Tradjenta per day as it is not rapid acting. I informed her that I would let Dr. Dwyane Dee know and call her back once Dr. Dwyane Dee has decided how to proceed.

## 2018-10-22 NOTE — Telephone Encounter (Signed)
Start Prandin 0.5 mg before lunch in addition to [employer]

## 2018-10-22 NOTE — Telephone Encounter (Signed)
rx has been sent 

## 2018-10-22 NOTE — Telephone Encounter (Signed)
Please confirm that you have sent Prandin

## 2018-10-23 ENCOUNTER — Encounter: Payer: Medicare Other | Admitting: Physician Assistant

## 2018-10-23 DIAGNOSIS — E11622 Type 2 diabetes mellitus with other skin ulcer: Secondary | ICD-10-CM | POA: Diagnosis not present

## 2018-10-23 DIAGNOSIS — Z992 Dependence on renal dialysis: Secondary | ICD-10-CM | POA: Diagnosis not present

## 2018-10-23 DIAGNOSIS — N186 End stage renal disease: Secondary | ICD-10-CM | POA: Diagnosis not present

## 2018-10-23 DIAGNOSIS — E1122 Type 2 diabetes mellitus with diabetic chronic kidney disease: Secondary | ICD-10-CM | POA: Diagnosis not present

## 2018-10-23 DIAGNOSIS — I120 Hypertensive chronic kidney disease with stage 5 chronic kidney disease or end stage renal disease: Secondary | ICD-10-CM | POA: Diagnosis not present

## 2018-10-23 DIAGNOSIS — L98492 Non-pressure chronic ulcer of skin of other sites with fat layer exposed: Secondary | ICD-10-CM | POA: Diagnosis not present

## 2018-10-24 DIAGNOSIS — N2581 Secondary hyperparathyroidism of renal origin: Secondary | ICD-10-CM | POA: Diagnosis not present

## 2018-10-24 DIAGNOSIS — N186 End stage renal disease: Secondary | ICD-10-CM | POA: Diagnosis not present

## 2018-10-24 DIAGNOSIS — E1129 Type 2 diabetes mellitus with other diabetic kidney complication: Secondary | ICD-10-CM | POA: Diagnosis not present

## 2018-10-24 DIAGNOSIS — D631 Anemia in chronic kidney disease: Secondary | ICD-10-CM | POA: Diagnosis not present

## 2018-10-24 DIAGNOSIS — D509 Iron deficiency anemia, unspecified: Secondary | ICD-10-CM | POA: Diagnosis not present

## 2018-10-25 DIAGNOSIS — E1122 Type 2 diabetes mellitus with diabetic chronic kidney disease: Secondary | ICD-10-CM | POA: Diagnosis not present

## 2018-10-25 DIAGNOSIS — N186 End stage renal disease: Secondary | ICD-10-CM | POA: Diagnosis not present

## 2018-10-25 DIAGNOSIS — Z992 Dependence on renal dialysis: Secondary | ICD-10-CM | POA: Diagnosis not present

## 2018-10-27 DIAGNOSIS — D631 Anemia in chronic kidney disease: Secondary | ICD-10-CM | POA: Diagnosis not present

## 2018-10-27 DIAGNOSIS — E1129 Type 2 diabetes mellitus with other diabetic kidney complication: Secondary | ICD-10-CM | POA: Diagnosis not present

## 2018-10-27 DIAGNOSIS — N2581 Secondary hyperparathyroidism of renal origin: Secondary | ICD-10-CM | POA: Diagnosis not present

## 2018-10-27 DIAGNOSIS — D509 Iron deficiency anemia, unspecified: Secondary | ICD-10-CM | POA: Diagnosis not present

## 2018-10-27 DIAGNOSIS — N186 End stage renal disease: Secondary | ICD-10-CM | POA: Diagnosis not present

## 2018-10-29 DIAGNOSIS — E1129 Type 2 diabetes mellitus with other diabetic kidney complication: Secondary | ICD-10-CM | POA: Diagnosis not present

## 2018-10-29 DIAGNOSIS — N2581 Secondary hyperparathyroidism of renal origin: Secondary | ICD-10-CM | POA: Diagnosis not present

## 2018-10-29 DIAGNOSIS — N186 End stage renal disease: Secondary | ICD-10-CM | POA: Diagnosis not present

## 2018-10-29 DIAGNOSIS — D509 Iron deficiency anemia, unspecified: Secondary | ICD-10-CM | POA: Diagnosis not present

## 2018-10-29 DIAGNOSIS — D631 Anemia in chronic kidney disease: Secondary | ICD-10-CM | POA: Diagnosis not present

## 2018-10-30 ENCOUNTER — Other Ambulatory Visit: Payer: Self-pay

## 2018-10-30 ENCOUNTER — Ambulatory Visit: Payer: Medicare Other | Admitting: Physician Assistant

## 2018-10-30 MED ORDER — LINAGLIPTIN 5 MG PO TABS: 5.0000 mg | ORAL_TABLET | Freq: Every day | ORAL | 3 refills | Status: DC

## 2018-10-31 DIAGNOSIS — D631 Anemia in chronic kidney disease: Secondary | ICD-10-CM | POA: Diagnosis not present

## 2018-10-31 DIAGNOSIS — E1129 Type 2 diabetes mellitus with other diabetic kidney complication: Secondary | ICD-10-CM | POA: Diagnosis not present

## 2018-10-31 DIAGNOSIS — D509 Iron deficiency anemia, unspecified: Secondary | ICD-10-CM | POA: Diagnosis not present

## 2018-10-31 DIAGNOSIS — N2581 Secondary hyperparathyroidism of renal origin: Secondary | ICD-10-CM | POA: Diagnosis not present

## 2018-10-31 DIAGNOSIS — N186 End stage renal disease: Secondary | ICD-10-CM | POA: Diagnosis not present

## 2018-11-03 DIAGNOSIS — N2581 Secondary hyperparathyroidism of renal origin: Secondary | ICD-10-CM | POA: Diagnosis not present

## 2018-11-03 DIAGNOSIS — D631 Anemia in chronic kidney disease: Secondary | ICD-10-CM | POA: Diagnosis not present

## 2018-11-03 DIAGNOSIS — E1129 Type 2 diabetes mellitus with other diabetic kidney complication: Secondary | ICD-10-CM | POA: Diagnosis not present

## 2018-11-03 DIAGNOSIS — N186 End stage renal disease: Secondary | ICD-10-CM | POA: Diagnosis not present

## 2018-11-03 DIAGNOSIS — D509 Iron deficiency anemia, unspecified: Secondary | ICD-10-CM | POA: Diagnosis not present

## 2018-11-05 DIAGNOSIS — E1129 Type 2 diabetes mellitus with other diabetic kidney complication: Secondary | ICD-10-CM | POA: Diagnosis not present

## 2018-11-05 DIAGNOSIS — D509 Iron deficiency anemia, unspecified: Secondary | ICD-10-CM | POA: Diagnosis not present

## 2018-11-05 DIAGNOSIS — D631 Anemia in chronic kidney disease: Secondary | ICD-10-CM | POA: Diagnosis not present

## 2018-11-05 DIAGNOSIS — N186 End stage renal disease: Secondary | ICD-10-CM | POA: Diagnosis not present

## 2018-11-05 DIAGNOSIS — N2581 Secondary hyperparathyroidism of renal origin: Secondary | ICD-10-CM | POA: Diagnosis not present

## 2018-11-06 ENCOUNTER — Encounter: Payer: Medicare Other | Attending: Physician Assistant | Admitting: Physician Assistant

## 2018-11-06 DIAGNOSIS — M109 Gout, unspecified: Secondary | ICD-10-CM | POA: Diagnosis not present

## 2018-11-06 DIAGNOSIS — Z992 Dependence on renal dialysis: Secondary | ICD-10-CM | POA: Insufficient documentation

## 2018-11-06 DIAGNOSIS — Z87891 Personal history of nicotine dependence: Secondary | ICD-10-CM | POA: Insufficient documentation

## 2018-11-06 DIAGNOSIS — Z833 Family history of diabetes mellitus: Secondary | ICD-10-CM | POA: Diagnosis not present

## 2018-11-06 DIAGNOSIS — G3184 Mild cognitive impairment, so stated: Secondary | ICD-10-CM | POA: Insufficient documentation

## 2018-11-06 DIAGNOSIS — L98492 Non-pressure chronic ulcer of skin of other sites with fat layer exposed: Secondary | ICD-10-CM | POA: Diagnosis not present

## 2018-11-06 DIAGNOSIS — E1140 Type 2 diabetes mellitus with diabetic neuropathy, unspecified: Secondary | ICD-10-CM | POA: Diagnosis not present

## 2018-11-06 DIAGNOSIS — E1151 Type 2 diabetes mellitus with diabetic peripheral angiopathy without gangrene: Secondary | ICD-10-CM | POA: Diagnosis not present

## 2018-11-06 DIAGNOSIS — Z8249 Family history of ischemic heart disease and other diseases of the circulatory system: Secondary | ICD-10-CM | POA: Diagnosis not present

## 2018-11-06 DIAGNOSIS — E11622 Type 2 diabetes mellitus with other skin ulcer: Secondary | ICD-10-CM | POA: Insufficient documentation

## 2018-11-06 DIAGNOSIS — E1122 Type 2 diabetes mellitus with diabetic chronic kidney disease: Secondary | ICD-10-CM | POA: Insufficient documentation

## 2018-11-06 DIAGNOSIS — E039 Hypothyroidism, unspecified: Secondary | ICD-10-CM | POA: Insufficient documentation

## 2018-11-06 DIAGNOSIS — N186 End stage renal disease: Secondary | ICD-10-CM | POA: Diagnosis not present

## 2018-11-06 DIAGNOSIS — I714 Abdominal aortic aneurysm, without rupture: Secondary | ICD-10-CM | POA: Diagnosis not present

## 2018-11-06 DIAGNOSIS — N2581 Secondary hyperparathyroidism of renal origin: Secondary | ICD-10-CM | POA: Insufficient documentation

## 2018-11-06 DIAGNOSIS — E785 Hyperlipidemia, unspecified: Secondary | ICD-10-CM | POA: Diagnosis not present

## 2018-11-06 DIAGNOSIS — I120 Hypertensive chronic kidney disease with stage 5 chronic kidney disease or end stage renal disease: Secondary | ICD-10-CM | POA: Diagnosis not present

## 2018-11-07 DIAGNOSIS — N186 End stage renal disease: Secondary | ICD-10-CM | POA: Diagnosis not present

## 2018-11-07 DIAGNOSIS — N2581 Secondary hyperparathyroidism of renal origin: Secondary | ICD-10-CM | POA: Diagnosis not present

## 2018-11-07 DIAGNOSIS — D509 Iron deficiency anemia, unspecified: Secondary | ICD-10-CM | POA: Diagnosis not present

## 2018-11-07 DIAGNOSIS — D631 Anemia in chronic kidney disease: Secondary | ICD-10-CM | POA: Diagnosis not present

## 2018-11-07 DIAGNOSIS — E1129 Type 2 diabetes mellitus with other diabetic kidney complication: Secondary | ICD-10-CM | POA: Diagnosis not present

## 2018-11-07 NOTE — Progress Notes (Signed)
TOWNES, FUHS ([id number]) Visit Report for 11/06/2018 Fall Risk Assessment Details Patient Name: [name], [name]. Date of Service: 11/06/2018 12:30 PM Medical Record Number: [id number] Patient Account Number: [id number] Date of Birth/Sex: 1941-07-19 (77 y.o. M) Treating RN: Montey Hora Primary Care Other [place]: Tedra Senegal Other Clinician: Referring Darcel Frane: Tedra Senegal Treating Obie Kallenbach/Extender: [name], HOYT Weeks in Treatment: 8 Fall Risk Assessment Items Have you had 2 or more falls in the last 12 monthso 0 Yes Have you had any fall that resulted in injury in the last 12 monthso 0 No FALL RISK ASSESSMENT: History of falling - immediate or within 3 months 25 Yes Secondary diagnosis 0 No Ambulatory aid None/bed rest/wheelchair/nurse 0 No Crutches/cane/walker 15 Yes Furniture 0 No IV Access/Saline Lock 0 No Gait/Training Normal/bed rest/immobile 0 No Weak 10 Yes Impaired 20 Yes Mental Status Oriented to own ability 0 No Electronic Signature(s) Signed: 11/06/2018 4:47:47 PM By: Montey Hora Entered By: Montey Hora on 11/06/2018 13:01:52

## 2018-11-08 NOTE — Progress Notes (Signed)
[name], [name] ([id number]) Visit Report for 11/06/2018 Chief Complaint Document Details Patient Name: [name], [name]. Date of Service: 11/06/2018 12:30 PM Medical Record Number: [id number] Patient Account Number: [id number] Date of Birth/Sex: 09/17/1941 (77 y.o. M) Treating RN: Montey Hora Primary Care Provider: Tedra Senegal Other Clinician: Referring Provider: Tedra Senegal Treating Provider/Extender: [name], HOYT Weeks in Treatment: 8 Information Obtained from: Patient Chief Complaint Abdominal ulcers Electronic Signature(s) Signed: 11/07/2018 8:42:52 AM By: Worthy Keeler PA-C Entered By: Worthy Keeler on 11/06/2018 12:59:30 Winstead, [name] ([id number]) -------------------------------------------------------------------------------- HPI Details Patient Name: [name]. Date of Service: 11/06/2018 12:30 PM Medical Record Number: [id number] Patient Account Number: [id number] Date of Birth/Sex: Oct 19, 1940 (77 y.o. M) Treating RN: Montey Hora Primary Care Provider: Tedra Senegal Other Clinician: Referring Provider: Tedra Senegal Treating Provider/Extender: [name], HOYT Weeks in Treatment: 8 History of Present Illness HPI Description: 78 year old gentleman seen earlier this year in January for a abdominal wound is now back with the same problem which has recurred for about 2 weeks and he has been trying to apply some local antibiotic ointment and a Band-Aid there. Of note he has significant pressure in this area due to his beltline and may have put on some weight which results in his trousers being very tight around the waist. The else has changed in his HandP and his diabetes is pretty well controlled. most recent hemoglobin A1c on 12/06/2016 was 7.8%. he was reviewed by his endocrinologist Dr. Dwyane Dee who made appropriate modifications to his treatment plan. he was recently seen by neurology for mild cognitive impairment that was found to be normal for his  age 70/21/18 patient appears to be doing well in regard to his abdominal wound area on evaluation today. He has been tolerating the dressing changes with the [facility] Dressing I'm pleased with how this has progressed and in fact the wound appears to be completely healed on evaluation today. There is definitely no evidence of infection. 03/29/17 Patient's wound on the abdominal region appears to be completely healed on evaluation today. This wound has been doing very well and I think the biggest thing is going to be keeping it from reactivation where it rubbed on his pants. 04/18/2017 -- he was recently discharged about 3 weeks ago and was using a binder which probably caused him abrasion on the area of the recently healed scar. 05/02/17 on evaluation today patient appears to be doing fairly well in regard to his abdominal wound at the site of the surgical scar. Unfortunately the scar tissue is very fragile and tends to reopen. He did get a small abdominal binder to try to pad the area in order to offload and prevent his pants from rubbing specifically at the site. With that being said he tells me that Dr. Con Memos was concerned that the binder might actually have rub the area causing a reopening. I did have a look at this today and unfortunately it appears that he was putting this over his pants which was making matters worse. 05/09/17 on evaluation today patient continues to do well with the [facility] Dressing. There is a slight skin tear inferior to the wound that we have been treating that may have been due to the dressing sticking slightly. This is minimal however. I did discuss with patient prior to removal of the dressing for dressing changes that they whet this in order to loosen it and not risk damaging in the field. Nonetheless it appears that they have been doing  very well with the dressing changes he had his wife took over the dressing changes as she states that he was doing it wrong.  Overall though I'm pleased with how things are progressing. No fevers, chills, nausea, or vomiting noted at this time. Readmission: 08/27/17 on evaluation today patient appears to be doing similarly to how he was doing prior to healing previously August 2018 when we previously discharged him. He has fortunately been doing well up until just a couple of weeks ago and he had some of the [facility] Dressing over so he began to treat this on his own. He states that this has gotten much smaller but he ran out of supplies and therefore made an appointment to come back in for evaluation. No fevers, chills, nausea, or vomiting noted at this time. He has no discomfort at this point in regard to the wound. He does tell me that he has one maybe two compression fractures in his lower back and that he is going to be seen by the physician at [facility] imaging, Dr. Estanislado Pandy. ====== Old notes The 78 year old gentleman who has a past medical history of diabetes mellitus, end-stage renal disease on dialysis, hypertension, secondary hyperparathyroidism, peripheral vascular disease and history of previous arterial bifemoral bypass graft in 1992 also has a history of gout, hyperlipidemia, hypothyroidism. last hemoglobin A1c was 7.5%. status post AV fistula Mendonca, [name]. ([id number]) placement, breast surgery for left granulomatous mastitis, triple a repair in 1992, small bowel obstruction, upper arm angiograms for AV fistula on the left side. was noted to have a superficial skin wound in the periumbilical area since late December 2017. He was asked to clean it with hydrogen peroxide and apply Bactroban. He was seen by vascular surgery in October 2017 where his right ABI was 0.56 left ABI was 0.87 and right TBI was 0.69 and left TBI was 0.70 ===== 09/25/16; patient was previously a patient of Dr. Ardeen Garland who has recurrent skin breakdown in the area of the large incision that was initially 4 apparently an  open AAA aneurysm repair. He had secondary surgery but I'm not sure of what etiology this was. He is been using Hydrofera Blue to the open wounds in the mid abdominal surgical incision. This is now closed over. I think he also uses this area as his waist for his pants and I've asked him to adjust this Readmission: 02/11/18 on evaluation today patient presents for evaluation concerning a sacral pressure ulcer which has been present for several weeks. He states that he actually had several areas that were open there appears just be one remaining. He has been tolerating the dressing changes is wise been using Hydrofera Blue Dressing as left over from his abdominal ulcer that we have previously treated. Fortunately there does not appear to be any evidence of infection which is good news. With that being said he states that he's been having issues at one point even at dialysis he had to discontinue dialysis and go home due to the pain. He has not been using any cushion to sit on although he states he does have one that he can use. No fevers, chills, nausea, or vomiting noted at this time. He had a significant issue with diarrhea prior to this occurring and I believe this may have had some contributing factor as well. 02/18/18 on evaluation today patient's wound actually appears to be doing about the same. Unfortunately he really has not noted any significant improvement over last week he still is  very tender even just to light touch unfortunately over the periwound region. There does not appear to be any evidence of infection which is good news. He is especially tender at the 12 o'clock location even a full fingers with away from the actual wound site. There still slough covering the surface of the wound. 03/04/18 on evaluation today patient's wound actually appears to show signs of great improvement compared to last week's evaluation. He is definitely showing improvement week by week which I think is  great news. Obviously that is what we are looking for. Nonetheless I did not even need to perform any sharp debridement today being that there was no slough noted on the surface of the wound. His blood pressure continues to remain low although this is something that is normal. That is for him. 03/18/18 on evaluation today patient's wound actually appears to potentially be completely healed although I cannot tell for sure. He has a small indeed very small eschar noted over the wound surface which also was measuring smaller than what it was previous. With that being said he has discomfort to the point that I'm not really able to do much with this in order to remove the eschar and prove that everything is completely healed underneath. Nonetheless it's starting to look like this may lift up on its own and I think that may be the best course for him currently. 04/01/18 on evaluation today patient appears to be doing very well in fact his wound is completely healed today. He's been tolerating the dressing changes without complication. With that being said I do believe at this point that he is ready for discharge she still having some discomfort deeper and he tells me this may just be some irritation from the long periods of time he has to sit for dialysis nonetheless he's not having the redness that was previously noted I think he has done very well trying to keep pressure off of this area. Readmission: 09/11/18 on evaluation today patient presents for reevaluation here in our clinic due to a remission with ulcers of the abdominal area. This is a region that he has previously been treated for bar clinic as well we were able to get this healed. 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 He states he was in the hospital when this just suddenly came up toward the end of his hospital stay. There really is nothing else currently going on  indicating more significant issue at this point. Fortunately No fevers, chills, nausea, or vomiting noted at this time. The patient does have diabetes, is on dialysis, and states that the area does tend to hurt but only near the bottom of the wound though this is rather painful. 10/02/18 on evaluation today patient's abdominal ulcer is actually appear to be doing excellent at this point. He's been tolerating the dressing changes without complication. I do believe that this is doing very well for him. No fevers, chills, nausea, or Gora, [name]. ([id number]) vomiting noted at this time. 10/09/18 on evaluation today patient appears to be doing very well in regard to his abdominal ulcers. In fact one area is almost completely healed. The other is shown signs of good epithelialization at this point. Overall I see no signs of infection and things seem to be progressing nicely. 10/16/18 on evaluation today patient appears to be doing well in regard to his abdominal ulcers. He has been tolerating the dressing changes  without complication. Fortunately there is no signs of infection at this time. Overall he seems to be doing very well. 10/23/18 on evaluation today patient actually appears to be doing very well in regard to his abdominal ulcer. This area has continued to heal quite nicely which is excellent news. Overall I'm very pleased with the progress he has made. 11/06/18 on evaluation today patient actually appears to be doing excellent in regard to his which is good news. Fortunately there's no signs of infection at this time. No fevers, chills, nausea, or vomiting noted at this time. In general the patient's been very pleased with how things seem to be progressing which is excellent news as well. Overall there's no signs of infection at this time. Electronic Signature(s) Signed: 11/07/2018 8:42:52 AM By: Worthy Keeler PA-C Entered By: Worthy Keeler on 11/06/2018 13:07:54 Detlefsen, [name]  ([id number]) -------------------------------------------------------------------------------- Physical Exam Details Patient Name: [name]. Date of Service: 11/06/2018 12:30 PM Medical Record Number: [id number] Patient Account Number: [id number] Date of Birth/Sex: October 29, 1940 (77 y.o. M) Treating RN: Montey Hora Primary Care Provider: Tedra Senegal Other Clinician: Referring Provider: Tedra Senegal Treating Provider/Extender: [name], HOYT Weeks in Treatment: 8 Constitutional Well-nourished and well-hydrated in no acute distress. Respiratory normal breathing without difficulty. clear to auscultation bilaterally. Cardiovascular regular rate and rhythm with normal S1, S2. Psychiatric this patient is able to make decisions and demonstrates good insight into disease process. Alert and Oriented x 3. pleasant and cooperative. Notes Patient's wound bed currently shows evidence of good granulation at this time. He also has excellent epithelialization which is good news. I'm very pleased with how things seem to have been progressing over the past several weeks in particular. Electronic Signature(s) Signed: 11/07/2018 8:42:52 AM By: Worthy Keeler PA-C Entered By: Worthy Keeler on 11/06/2018 13:09:13 Vancuren, [name] ([id number]) -------------------------------------------------------------------------------- Physician Orders Details Patient Name: [name]. Date of Service: 11/06/2018 12:30 PM Medical Record Number: [id number] Patient Account Number: [id number] Date of Birth/Sex: [birth year] (77 y.o. 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 Anesthetic (add to Medication List) Wound #6 Right Abdomen - Lower Quadrant o Topical Lidocaine 4% cream applied to wound bed prior to debridement (In Clinic Only). Primary Wound Dressing Wound #6 Right Abdomen - Lower Quadrant o Hydrafera Blue Ready Transfer Secondary Dressing Wound #6 Right Abdomen - Lower Quadrant o Boardered Foam Dressing Dressing Change Frequency Wound #6 Right Abdomen - Lower Quadrant o Change Dressing Monday, Wednesday, Friday Follow-up Appointments Wound #6 Right Abdomen - Lower Quadrant o Return Appointment in 1 week. Home Health Wound #6 Right Abdomen - [place] Visits o Home Health Nurse may visit PRN to address patientos wound care needs. o FACE TO FACE ENCOUNTER: MEDICARE and MEDICAID PATIENTS: I certify that this patient is under my care and that I had a face-to-face encounter that meets the physician face-to-face encounter requirements with this patient on this date. The encounter with the patient was in whole or in part for the following MEDICAL CONDITION: (primary reason  for Home Healthcare) MEDICAL NECESSITY: I certify, that based on my findings, NURSING services are a medically necessary home health service. HOME BOUND STATUS: I certify that my clinical findings support that this patient is homebound (i.e., Due to illness or injury, pt requires aid of Sorter, OLNEY MONIER ([id number]) supportive devices such as crutches, cane, wheelchairs, walkers, the use of special transportation or the assistance of another person to leave their place of residence. There is a normal inability to leave the home and doing so requires considerable and taxing effort. Other absences are for medical reasons / religious services and are  infrequent or of short duration when for other reasons). o If current dressing causes regression in wound condition, may D/C ordered dressing product/s and apply Normal Saline Moist Dressing daily until next [place] / Other MD appointment. [place] of regression in wound condition at [phone]. o Please direct any NON-WOUND related issues/requests for orders to patient's Primary Care Physician Electronic Signature(s) Signed: 11/06/2018 4:47:47 PM By: Montey Hora Signed: 11/07/2018 8:42:52 AM By: Worthy Keeler PA-C Entered By: Montey Hora on 11/06/2018 13:02:56 Klecka, [name] ([id number]) -------------------------------------------------------------------------------- Problem List Details Patient Name: [name]. Date of Service: 11/06/2018 12:30 PM Medical Record Number: [id number] Patient Account Number: [id number] Date of Birth/Sex: Jan 15, 1941 (77 y.o. 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 Date of Service: 11/06/2018 12:30 PM Medical Record Number: [id number] Patient Account Number:  [id number] Date of Birth/Sex: 1941/07/03 (77 y.o. M) Treating RN: Montey Hora Primary Care Provider: Tedra Senegal Other Clinician: Referring Provider: Tedra Senegal Treating Provider/Extender: [name], HOYT Weeks in Treatment: 8 Subjective Chief Complaint Information obtained from Patient Abdominal ulcers History of Present Illness (HPI) 78 year old gentleman seen earlier this year in January for a abdominal wound is now back with the same problem which has recurred for about 2 weeks and he has been trying to apply some local antibiotic ointment and a Band-Aid there. Of note he has significant pressure in this area due to his beltline and may have put on some weight which results in his trousers being very tight around the waist. The else has changed in his HandP and his diabetes is pretty well controlled. most recent hemoglobin A1c on 12/06/2016 was 7.8%. he was reviewed by his endocrinologist Dr. Dwyane Dee who made appropriate modifications to his treatment plan. he was recently seen by neurology for mild cognitive impairment that was found to be normal for his age 16/21/18 patient appears to be doing well in regard to his abdominal wound area on evaluation today. He has been tolerating the dressing changes with the [facility] Dressing I'm pleased with how this has progressed and in fact the wound appears to be completely healed on evaluation today. There is definitely no evidence of infection. 03/29/17 Patient's wound on the abdominal region appears to be completely healed  on evaluation today. This wound has been doing very well and I think the biggest thing is going to be keeping it from reactivation where it rubbed on his pants. 04/18/2017 -- he was recently discharged about 3 weeks ago and was using a binder which probably caused him abrasion on the area of the recently healed scar. 05/02/17 on evaluation today patient appears to be doing fairly well in regard to his abdominal wound at  the site of the surgical scar. Unfortunately the scar tissue is very fragile and tends to reopen. He did get a small abdominal binder to try to pad the area in order to offload and prevent his pants from rubbing specifically at the site. With that being said he tells me that Dr. Con Memos was concerned that the binder might actually have rub the area causing a reopening. I did have a look at this today and unfortunately it appears that he was putting this over his pants which was making matters worse. 05/09/17 on evaluation today patient continues to do well with the [facility] Dressing. There is a slight skin tear inferior to the wound that we have been treating that may have been due to the dressing sticking slightly. This is minimal however. I did discuss with patient prior to removal of the dressing for dressing changes that they whet this in order to loosen it and not risk damaging in the field. Nonetheless it appears that they have been doing very well with the dressing changes he had his wife took over the dressing changes as she states that he was doing it wrong. Overall though I'm pleased with how things are progressing. No fevers, chills, nausea, or vomiting noted at this time. Readmission: 08/27/17 on evaluation today patient appears to be doing similarly to how he was doing prior to healing previously August 2018 when we previously discharged him. He has fortunately been doing well up until just a couple of weeks ago and he had some of the [facility] Dressing over so he began to treat this on his own. He states that this has gotten much smaller but he ran out of supplies and therefore made an appointment to come back in for evaluation. No fevers, chills, nausea, or vomiting noted at this time. He has no discomfort at this point in regard to the wound. He does tell me that he has one maybe two compression fractures in his lower back and that he is going to be seen by the physician at  [facility] imaging, Dr. Estanislado Pandy. [name], [name] ([id number]) ====== Old notes The 78 year old gentleman who has a past medical history of diabetes mellitus, end-stage renal disease on dialysis, hypertension, secondary hyperparathyroidism, peripheral vascular disease and history of previous arterial bifemoral bypass graft in 1992 also has a history of gout, hyperlipidemia, hypothyroidism. last hemoglobin A1c was 7.5%. status post AV fistula placement, breast surgery for left granulomatous mastitis, triple a repair in 1992, small bowel obstruction, upper arm angiograms for AV fistula on the left side. was noted to have a superficial skin wound in the periumbilical area since late December 2017. He was asked to clean it with hydrogen peroxide and apply Bactroban. He was seen by vascular surgery in October 2017 where his right ABI was 0.56 left ABI was 0.87 and right TBI was 0.69 and left TBI was 0.70 ===== 09/25/16; patient was previously a patient of Dr. Ardeen Garland who has recurrent skin breakdown in the area of the large incision that was initially 4 apparently  an open AAA aneurysm repair. He had secondary surgery but I'm not sure of what etiology this was. He is been using Hydrofera Blue to the open wounds in the mid abdominal surgical incision. This is now closed over. I think he also uses this area as his waist for his pants and I've asked him to adjust this Readmission: 02/11/18 on evaluation today patient presents for evaluation concerning a sacral pressure ulcer which has been present for several weeks. He states that he actually had several areas that were open there appears just be one remaining. He has been tolerating the dressing changes is wise been using Hydrofera Blue Dressing as left over from his abdominal ulcer that we have previously treated. Fortunately there does not appear to be any evidence of infection which is good news. With that being said he states that he's been having  issues at one point even at dialysis he had to discontinue dialysis and go home due to the pain. He has not been using any cushion to sit on although he states he does have one that he can use. No fevers, chills, nausea, or vomiting noted at this time. He had a significant issue with diarrhea prior to this occurring and I believe this may have had some contributing factor as well. 02/18/18 on evaluation today patient's wound actually appears to be doing about the same. Unfortunately he really has not noted any significant improvement over last week he still is very tender even just to light touch unfortunately over the periwound region. There does not appear to be any evidence of infection which is good news. He is especially tender at the 12 o'clock location even a full fingers with away from the actual wound site. There still slough covering the surface of the wound. 03/04/18 on evaluation today patient's wound actually appears to show signs of great improvement compared to last week's evaluation. He is definitely showing improvement week by week which I think is great news. Obviously that is what we are looking for. Nonetheless I did not even need to perform any sharp debridement today being that there was no slough noted on the surface of the wound. His blood pressure continues to remain low although this is something that is normal. That is for him. 03/18/18 on evaluation today patient's wound actually appears to potentially be completely healed although I cannot tell for sure. He has a small indeed very small eschar noted over the wound surface which also was measuring smaller than what it was previous. With that being said he has discomfort to the point that I'm not really able to do much with this in order to remove the eschar and prove that everything is completely healed underneath. Nonetheless it's starting to look like this may lift up on its own and I think that may be the best course for  him currently. 04/01/18 on evaluation today patient appears to be doing very well in fact his wound is completely healed today. He's been tolerating the dressing changes without complication. With that being said I do believe at this point that he is ready for discharge she still having some discomfort deeper and he tells me this may just be some irritation from the long periods of time he has to sit for dialysis nonetheless he's not having the redness that was previously noted I think he has done very well trying to keep pressure off of this area. Readmission: 09/11/18 on evaluation today patient presents for reevaluation here in our  clinic due to a remission with ulcers of the abdominal area. This is a region that he has previously been treated for bar clinic as well we were able to get this healed. 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 He states he was in the hospital when this just suddenly came up toward the end of his hospital stay. There really is nothing else currently going on indicating more significant issue at this point. Fortunately No fevers, chills, nausea, or vomiting noted at this time. [name], [name] ([id number]) The patient does have diabetes, is on dialysis, and states that the area does tend to hurt but only near the bottom of the wound though this is rather painful. 10/02/18 on evaluation today patient's abdominal ulcer is actually appear to be doing excellent at this point. He's been tolerating the dressing changes without complication. I do believe that this is doing very well for him. No fevers, chills, nausea, or vomiting noted at this time. 10/09/18 on evaluation today patient appears to be doing very well in regard to his abdominal ulcers. In fact one area is almost completely healed. The other is shown signs of good epithelialization at this point. Overall I see no signs of infection and things seem  to be progressing nicely. 10/16/18 on evaluation today patient appears to be doing well in regard to his abdominal ulcers. He has been tolerating the dressing changes without complication. Fortunately there is no signs of infection at this time. Overall he seems to be doing very well. 10/23/18 on evaluation today patient actually appears to be doing very well in regard to his abdominal ulcer. This area has continued to heal quite nicely which is excellent news. Overall I'm very pleased with the progress he has made. 11/06/18 on evaluation today patient actually appears to be doing excellent in regard to his which is good news. Fortunately there's no signs of infection at this time. No fevers, chills, nausea, or vomiting noted at this time. In general the patient's been very pleased with how things seem to be progressing which is excellent news as well. Overall there's no signs of infection at this time. Patient History Information obtained from Patient. Family History Diabetes - Father,Paternal Grandparents, Heart Disease - Father, Hypertension - Father, No family history of Cancer, Hereditary Spherocytosis, Kidney Disease, Lung Disease, Seizures, Stroke, Thyroid Problems, Tuberculosis. Social History Former smoker - quit in 1996, Marital Status - Married, Alcohol Use - Never - quit in 1976, Drug Use - No History, Caffeine Use - Never. Medical History Eyes Denies history of Cataracts, Glaucoma, Optic Neuritis Ear/Nose/Mouth/Throat Denies history of Chronic sinus problems/congestion, Middle ear problems Hematologic/Lymphatic Patient has history of Anemia Denies history of Hemophilia, Human Immunodeficiency Virus, Lymphedema, Sickle Cell Disease Cardiovascular Patient has history of Hypertension, Peripheral Venous Disease Denies history of Angina, Arrhythmia, Congestive Heart Failure, Coronary Artery Disease, Deep Vein Thrombosis, Hypotension, Myocardial Infarction, Peripheral Arterial  Disease, Phlebitis, Vasculitis Gastrointestinal Denies history of Cirrhosis , Colitis, Crohn s, Hepatitis A, Hepatitis B, Hepatitis C Endocrine Patient has history of Type II Diabetes Denies history of Type I Diabetes Genitourinary 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 Immunological Denies history of Lupus Erythematosus, Raynaud s, Scleroderma Integumentary (Skin) Denies history of History of Burn, History of pressure wounds Musculoskeletal Gillum, [name] ([id number]) Patient has history of Gout Denies history of Rheumatoid Arthritis, Osteoarthritis, Osteomyelitis Neurologic Patient has history of Neuropathy Denies history of  Dementia, Quadriplegia, Paraplegia, Seizure Disorder Oncologic Denies history of Received Chemotherapy, Received Radiation Psychiatric Denies history of Anorexia/bulimia, Confinement Anxiety 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 Respiratory The patient has no complaints or symptoms. Cardiovascular The patient has no complaints or symptoms. Psychiatric The patient has no complaints or symptoms. Objective Constitutional Well-nourished and well-hydrated in no acute distress. Vitals Time Taken: 12:42 PM, Height: 71 in, Weight: 146 lbs, BMI: 20.4, Temperature: 97.8 F, Pulse: 76 bpm, Respiratory Rate: 16 breaths/min, Blood Pressure: 105/58 mmHg. Respiratory normal breathing without difficulty. clear to auscultation bilaterally. Cardiovascular regular rate and rhythm with normal S1, S2. Psychiatric this patient is able to make decisions and demonstrates good insight into disease process. Alert and Oriented x 3. pleasant and cooperative. General Notes: Patient's wound bed currently shows evidence of good granulation at  this time. He also has excellent epithelialization which is good news. I'm very pleased with how things seem to have been progressing over the past several weeks in particular. Integumentary (Hair, Skin) Heikes, [name] ([id number]) Wound #6 status is Open. Original cause of wound was Blister. The wound is located on the Right Abdomen - Lower Quadrant. The wound measures 0.5cm length x 0.3cm width x 0.1cm depth; 0.118cm^2 area and 0.012cm^3 volume. There is Fat Layer (Subcutaneous Tissue) Exposed exposed. There is no tunneling or undermining noted. There is a small amount of serous drainage noted. The wound margin is flat and intact. There is large (67-100%) pink granulation within the wound bed. There is a small (1-33%) amount of necrotic tissue within the wound bed including Adherent Slough. The periwound skin appearance exhibited: Scarring. The periwound skin appearance did not exhibit: Callus, Crepitus, Excoriation, Induration, Rash, Dry/Scaly, Maceration, Atrophie Blanche, Cyanosis, Ecchymosis, Hemosiderin Staining, Mottled, Pallor, Rubor, Erythema. Periwound temperature was noted as No Abnormality. The periwound has tenderness on palpation. 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 Anesthetic (add to Medication List): Wound #6 Right Abdomen - Lower Quadrant: Topical Lidocaine 4% cream applied to wound bed prior to debridement (In Clinic Only). Primary Wound Dressing: Wound #6 Right Abdomen - Lower Quadrant: Hydrafera Blue Ready Transfer Secondary Dressing: Wound #6 Right Abdomen - Lower Quadrant: Boardered Foam Dressing Dressing Change Frequency: Wound #6 Right Abdomen - Lower Quadrant: Change Dressing  Monday, Wednesday, Friday Follow-up Appointments: Wound #6 Right Abdomen - Lower Quadrant: Return Appointment in 1 week. Home Health: Wound #6 Right Abdomen - Lower Quadrant: [place] Nurse may visit PRN to address patient s wound care needs. FACE TO FACE ENCOUNTER: MEDICARE and MEDICAID PATIENTS: I certify that this patient is under my care and that I had a face-to-face encounter that meets the physician face-to-face encounter requirements with this patient on this date. The encounter with the patient was in whole or in part for the following MEDICAL CONDITION: (primary reason for [place]) MEDICAL NECESSITY: I certify, that based on my findings, NURSING services are a medically necessary home health service. HOME BOUND STATUS: I certify that my clinical findings support that this patient is homebound (i.e., Due to illness or injury, pt requires aid of supportive devices such as crutches,  cane, wheelchairs, walkers, the use of special transportation or the assistance of another person to leave their place of residence. There is a normal inability to leave the [name], [name]. ([id number]) home and doing so requires considerable and taxing effort. Other absences are for medical reasons / religious services and are infrequent or of short duration when for other reasons). If current dressing causes regression in wound condition, may D/C ordered dressing product/s and apply Normal Saline Moist Dressing daily until next [place] / Other MD appointment. [place] of regression in wound condition at [phone]. Please direct any NON-WOUND related issues/requests for orders to patient's Primary Care Physician No sharp debridement was necessary required today which is good news. After evaluation the wound seems to be doing much better there's just a very small opening still remaining. We are gonna continue to monitor this over the  next week he's in agreement with plan. If anything changes worsens meantime he will contact the office and let me know. Please see above for specific wound care orders. We will see patient for re-evaluation in 1 week(s) here in the clinic. If anything worsens or changes patient will contact our office for additional recommendations. Electronic Signature(s) Signed: 11/07/2018 8:42:52 AM By: Worthy Keeler PA-C Entered By: Worthy Keeler on 11/06/2018 13:09:44 Lierman, [name] ([id number]) -------------------------------------------------------------------------------- ROS/PFSH Details Patient Name: [name]. Date of Service: 11/06/2018 12:30 PM Medical Record Number: [id number] Patient Account Number: [id number] Date of Birth/Sex: 07-15-1941 (77 y.o. M) Treating RN: Montey Hora Primary Care Provider: Tedra Senegal Other Clinician: Referring Provider: Tedra Senegal Treating Provider/Extender: [name], HOYT Weeks in Treatment: 8 Information Obtained From Patient Wound History Do you currently have one or more open woundso Yes How many open wounds do you currently haveo 2 Approximately how long have you had your woundso 2 weeks How have you been treating your wound(s) until nowo zinc ,drsg Has your wound(s) ever healed and then re-openedo No Have you had any lab work done in the past montho Yes Who ordered the lab work doneo [facility] Have you tested positive for an antibiotic resistant organism (MRSA, VRE)o No Have you tested positive for osteomyelitis (bone infection)o No Have you had any tests for circulation on your legso No Constitutional Symptoms (General Health) Complaints and Symptoms: Negative for: Fever; Chills Eyes Medical History: Negative for: Cataracts; Glaucoma; Optic Neuritis Ear/Nose/Mouth/Throat Medical History: Negative for: Chronic sinus problems/congestion; Middle ear problems Hematologic/Lymphatic Medical History: Positive for:  Anemia Negative for: Hemophilia; Human Immunodeficiency Virus; Lymphedema; Sickle Cell Disease Respiratory Complaints and Symptoms: No Complaints or Symptoms Cardiovascular Complaints and Symptoms: No Complaints or Symptoms Medical History: Positive for: Hypertension; Peripheral Venous Disease Negative for: Angina; Arrhythmia; Congestive Heart Failure; Coronary Artery Disease; Deep Vein Thrombosis; Hypotension; Myocardial Infarction; Peripheral Arterial Disease; Phlebitis; Vasculitis Casler, [name] ([id number]) Past Medical History Notes: artificial valve, hyperlipidemia Gastrointestinal Medical History: Negative for: Cirrhosis ; Colitis; Crohnos; Hepatitis A; Hepatitis B; Hepatitis C Past Medical History Notes: scar tissue from multiple surgeries Endocrine Medical History: Positive for: Type II Diabetes Negative for: Type I Diabetes Treated with: Oral agents Genitourinary Medical History: 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 Immunological Medical History: Negative for: Lupus Erythematosus; Raynaudos; Scleroderma Integumentary (Skin) Medical History: Negative for: History of Burn; History of pressure wounds Musculoskeletal Medical History: Positive for: Gout Negative for: Rheumatoid Arthritis; Osteoarthritis; Osteomyelitis Neurologic Medical History: Positive for: Neuropathy Negative for: Dementia; Quadriplegia; Paraplegia; Seizure Disorder Oncologic Medical History:  Negative for: Received Chemotherapy; Received Radiation Psychiatric Complaints and Symptoms: No Complaints or Symptoms Medical History: Negative for: Anorexia/bulimia; Confinement Anxiety Immunizations [name], [name] ([id number]) Pneumococcal Vaccine: Received Pneumococcal Vaccination: Yes Immunization Notes: uo to date Implantable Devices Family and Social History Cancer: No; Diabetes: Yes - Father,Paternal Grandparents; Heart Disease: Yes - Father; Hereditary Spherocytosis:  No; Hypertension: Yes - Father; Kidney Disease: No; Lung Disease: No; Seizures: No; Stroke: No; Thyroid Problems: No; Tuberculosis: No; Former smoker - quit in 1996; Marital Status - Married; Alcohol Use: Never - quit in 1976; Drug Use: No History; Caffeine Use: Never; Financial Concerns: No; Food, Clothing or Shelter Needs: No; Support System Lacking: No; Transportation Concerns: No; Advanced Directives: No; Patient does not want information on Advanced Directives Physician Affirmation I have reviewed and agree with the above information. Electronic Signature(s) Signed: 11/06/2018 4:47:47 PM By: Montey Hora Signed: 11/07/2018 8:42:52 AM By: Worthy Keeler PA-C Entered By: Worthy Keeler on 11/06/2018 13:08:40 [name], [name] ([id number]) -------------------------------------------------------------------------------- SuperBill Details Patient Name: [name]. Date of Service: 11/06/2018 Medical Record Number: [id number] Patient Account Number: [id number] Date of Birth/Sex: Apr 18, 1941 (78 y.o. M) Treating RN: Montey Hora Primary Care Provider: Tedra Senegal Other Clinician: Referring Provider: Tedra Senegal Treating Provider/Extender: [name], HOYT Weeks in Treatment: 8 Diagnosis Coding ICD-10 Codes Code Description E83.59 Other disorders of calcium metabolism L98.492 Non-pressure chronic ulcer of skin of other sites with fat layer exposed E11.622 Type 2 diabetes mellitus with other skin ulcer Z99.2 Dependence on renal dialysis Facility Procedures CPT4 Code: 29021115 Description: 99213 - WOUND CARE VISIT-LEV 3 EST PT Modifier: Quantity: 1 Physician Procedures CPT4 Code: 5208022 Description: 33612 - WC PHYS LEVEL 4 - EST PT ICD-10 Diagnosis Description E83.59 Other disorders of calcium metabolism L98.492 Non-pressure chronic ulcer of skin of other sites with fat l E11.622 Type 2 diabetes mellitus with other skin ulcer Z99.2  Dependence on renal dialysis Modifier: ayer  exposed Quantity: 1 Electronic Signature(s) Signed: 11/07/2018 8:42:52 AM By: Worthy Keeler PA-C Entered By: Worthy Keeler on 11/06/2018 13:09:59

## 2018-11-09 NOTE — Progress Notes (Signed)
[name] ([id number]) Visit Report for 11/06/2018 Arrival Information Details Patient Name: Johnny, [name]. Date of Service: 11/06/2018 12:30 PM Medical Record Number: [id number] Patient Account Number: [id number] Date of Birth/Sex: 09-08-1941 (77 y.o. M) Treating RN: Montey Hora Primary Care Bridie Colquhoun: Tedra Senegal Other Clinician: Referring [place] Sarinana: Tedra Senegal Treating Jaley Yan/Extender: [name], HOYT Weeks in Treatment: 8 Visit Information History Since Last Visit Added or deleted any medications: No Patient Arrived: Walker Any new allergies or adverse reactions: No Arrival Time: 12:39 Had a fall or experienced change in Yes Accompanied By: self activities of daily living that may affect Transfer Assistance: None risk of falls: Patient Identification Verified: Yes Signs or symptoms of abuse/neglect since last visito No Secondary Verification Process Completed: Yes Hospitalized since last visit: No Patient Requires Transmission-Based Precautions: No Implantable device outside of the clinic excluding No Patient Has Alerts: No cellular tissue based products placed in the center since last visit: Has Dressing in Place as Prescribed: Yes Pain Present Now: No Electronic Signature(s) Signed: 11/06/2018 4:04:17 PM By: Lorine Bears RCP, RRT, CHT Entered By: Becky Sax, Amado Nash on 11/06/2018 12:40:35 [name], [name] ([id number]) -------------------------------------------------------------------------------- Clinic Level of Care Assessment Details Patient Name: [name], [name]. Date of Service: 11/06/2018 12:30 PM Medical Record Number: [id number] Patient Account Number: [id number] Date of Birth/Sex: [birth year] (77 y.o. M) Treating RN: Montey Hora Primary Care Alania Overholt: Tedra Senegal Other Clinician: Referring Malakye Nolden: Tedra Senegal Treating Soren Lazarz/Extender: [name], HOYT Weeks in Treatment: 8 Clinic Level of Care Assessment Items TOOL 4  Quantity Score []  - Use when only an EandM is performed on FOLLOW-UP visit 0 ASSESSMENTS - Nursing Assessment / Reassessment X - Reassessment of Co-morbidities (includes updates in patient status) 1 10 X- 1 5 Reassessment of Adherence to Treatment Plan ASSESSMENTS - Wound and Skin Assessment / Reassessment X - Simple Wound Assessment / Reassessment - one wound 1 5 []  - 0 Complex Wound Assessment / Reassessment - multiple wounds []  - 0 Dermatologic / Skin Assessment (not related to wound area) ASSESSMENTS - Focused Assessment []  - Circumferential Edema Measurements - multi extremities 0 []  - 0 Nutritional Assessment / Counseling / Intervention []  - 0 Lower Extremity Assessment (monofilament, tuning fork, pulses) []  - 0 Peripheral Arterial Disease Assessment (using hand held doppler) ASSESSMENTS - Ostomy and/or Continence Assessment and Care []  - Incontinence Assessment and Management 0 []  - 0 Ostomy Care Assessment and Management (repouching, etc.) PROCESS - Coordination of Care X - Simple Patient / Family Education for ongoing care 1 15 []  - 0 Complex (extensive) Patient / Family Education for ongoing care X- 1 10 Staff obtains Programmer, systems, Records, Test Results / Process Orders []  - 0 Staff telephones HHA, Nursing Homes / Clarify orders / etc []  - 0 Routine Transfer to another Facility (non-emergent condition) []  - 0 Routine Hospital Admission (non-emergent condition) []  - 0 New Admissions / Biomedical engineer / Ordering NPWT, Apligraf, etc. []  - 0 Emergency Hospital Admission (emergent condition) X- 1 10 Simple Discharge Coordination Boot, [name] ([id number]) []  - 0 Complex (extensive) Discharge Coordination PROCESS - Special Needs []  - Pediatric / Minor Patient Management 0 []  - 0 Isolation Patient Management []  - 0 Hearing / Language / Visual special needs []  - 0 Assessment of Community assistance (transportation, D/C planning, etc.) []  - 0 Additional  assistance / Altered mentation []  - 0 Support Surface(s) Assessment (bed, cushion, seat, etc.) INTERVENTIONS - Wound Cleansing / Measurement X - Simple Wound Cleansing - one wound 1 5 []  -  0 Complex Wound Cleansing - multiple wounds X- 1 5 Wound Imaging (photographs - any number of wounds) []  - 0 Wound Tracing (instead of photographs) X- 1 5 Simple Wound Measurement - one wound []  - 0 Complex Wound Measurement - multiple wounds INTERVENTIONS - Wound Dressings X - Small Wound Dressing one or multiple wounds 1 10 []  - 0 Medium Wound Dressing one or multiple wounds []  - 0 Large Wound Dressing one or multiple wounds []  - 0 Application of Medications - topical []  - 0 Application of Medications - injection INTERVENTIONS - Miscellaneous []  - External ear exam 0 []  - 0 Specimen Collection (cultures, biopsies, blood, body fluids, etc.) []  - 0 Specimen(s) / Culture(s) sent or taken to Lab for analysis []  - 0 Patient Transfer (multiple staff / Civil Service fast streamer / Similar devices) []  - 0 Simple Staple / Suture removal (25 or less) []  - 0 Complex Staple / Suture removal (26 or more) []  - 0 Hypo / Hyperglycemic Management (close monitor of Blood Glucose) []  - 0 Ankle / Brachial Index (ABI) - do not check if billed separately X- 1 5 Vital Signs [name], [name] ([id number]) Has the patient been seen at the hospital within the last three years: Yes Total Score: 85 Level Of Care: New/Established - Level 3 Electronic Signature(s) Signed: 11/06/2018 4:47:47 PM By: Montey Hora Entered By: Montey Hora on 11/06/2018 13:03:33 [name], [name] ([id number]) -------------------------------------------------------------------------------- Encounter Discharge Information Details Patient Name: [name]. Date of Service: 11/06/2018 12:30 PM Medical Record Number: [id number] Patient Account Number: [id number] Date of Birth/Sex: Sep 13, 1941 (77 y.o. M) Treating RN: Montey Hora Primary  Care Kenyata Guess: Tedra Senegal Other Clinician: Referring [place] Surges: Tedra Senegal Treating Allexa Acoff/Extender: [name], HOYT Weeks in Treatment: 8 Encounter Discharge Information Items Discharge Condition: Stable Ambulatory Status: Walker Discharge Destination: Home Transportation: Private Auto Accompanied By: self Schedule Follow-up Appointment: Yes Clinical Summary of Care: Electronic Signature(s) Signed: 11/06/2018 4:47:47 PM By: Montey Hora Entered By: Montey Hora on 11/06/2018 13:04:05 [name], [name] ([id number]) -------------------------------------------------------------------------------- Lower Extremity Assessment Details Patient Name: [name]. Date of Service: 11/06/2018 12:30 PM Medical Record Number: [id number] Patient Account Number: [id number] Date of Birth/Sex: Sep 12, 1941 (77 y.o. M) Treating RN: Cornell Barman Primary Care Donalyn Schneeberger: Tedra Senegal Other Clinician: Referring Rudine Rieger: Tedra Senegal Treating Mallarie Voorhies/Extender: Sharalyn Ink in Treatment: 8 Electronic Signature(s) Signed: 11/07/2018 4:22:08 PM By: Gretta Cool, BSN, RN, CWS, Kim RN, BSN Entered By: Gretta Cool, BSN, RN, CWS, Kim on 11/06/2018 12:53:35 [name], [name] ([id number]) -------------------------------------------------------------------------------- Multi Wound Chart Details Patient Name: [name]. Date of Service: 11/06/2018 12:30 PM Medical Record Number: [id number] Patient Account Number: [id number] Date of Birth/Sex: [birth year] (77 y.o. M) Treating RN: Montey Hora Primary Care Margareth Kanner: Tedra Senegal Other Clinician: Referring Koya Hunger: Tedra Senegal Treating Nekeshia Lenhardt/Extender: [name], HOYT Weeks in Treatment: 8 Vital Signs Height(in): 71 Pulse(bpm): 76 Weight(lbs): 146 Blood Pressure(mmHg): 105/58 Body Mass Index(BMI): 20 Temperature(F): 97.8 Respiratory Rate 16 (breaths/min): Photos: [6:No Photos] [N/A:N/A] Wound Location: [6:Right Abdomen - Lower Quadrant]  [N/A:N/A] Wounding Event: [6:Blister] [N/A:N/A] Primary Etiology: [6:Abscess] [N/A:N/A] Comorbid History: [6:Anemia, Hypertension, Peripheral Venous Disease, Type II Diabetes, End Stage Renal Disease, Gout, Neuropathy] [N/A:N/A] Date Acquired: [6:08/28/2018] [N/A:N/A] Weeks of Treatment: [6:8] [N/A:N/A] Wound Status: [6:Open] [N/A:N/A] Measurements L x W x D [6:0.5x0.3x0.1] [N/A:N/A] (cm) Area (cm) : [6:0.118] [N/A:N/A] Volume (cm) : [6:0.012] [N/A:N/A] % Reduction in Area: [6:99.70%] [N/A:N/A] % Reduction in Volume: [6:99.70%] [N/A:N/A] Classification: [6:Full Thickness Without Exposed Support Structures] [N/A:N/A] Exudate Amount: [6:Small] [N/A:N/A] Exudate  Type: [6:Serous] [N/A:N/A] Exudate Color: [6:amber] [N/A:N/A] Wound Margin: [6:Flat and Intact] [N/A:N/A] Granulation Amount: [6:Large (67-100%)] [N/A:N/A] Granulation Quality: [6:Pink] [N/A:N/A] Necrotic Amount: [6:Small (1-33%)] [N/A:N/A] Exposed Structures: [6:Fat Layer (Subcutaneous Tissue) Exposed: Yes Fascia: No Tendon: No Muscle: No Joint: No Bone: No] [N/A:N/A] Epithelialization: [6:Medium (34-66%)] [N/A:N/A] Periwound Skin Texture: [N/A:N/A] Scarring: Yes Excoriation: No Induration: No Callus: No Crepitus: No Rash: No Periwound Skin Moisture: Maceration: No N/A N/A Dry/Scaly: No Periwound Skin Color: Atrophie Blanche: No N/A N/A Cyanosis: No Ecchymosis: No Erythema: No Hemosiderin Staining: No Mottled: No Pallor: No Rubor: No Temperature: No Abnormality N/A N/A Tenderness on Palpation: Yes N/A N/A Wound Preparation: Ulcer Cleansing: N/A N/A Rinsed/Irrigated with Saline Topical Anesthetic Applied: Other: lidocaine 4% Treatment Notes Electronic Signature(s) Signed: 11/06/2018 4:47:47 PM By: Montey Hora Entered By: Montey Hora on 11/06/2018 13:01:28 Balding, [name] ([id number]) -------------------------------------------------------------------------------- [place]  Details Patient Name: [name]. Date of Service: 11/06/2018 12:30 PM Medical Record Number: [id number] Patient Account Number: [id number] Date of Birth/Sex: May 11, 1941 (77 y.o. M) Treating RN: Montey Hora Primary Care Jeaneen Cala: Tedra Senegal Other Clinician: Referring Shonika Kolasinski: Tedra Senegal Treating Aleyna Cueva/Extender: [name], HOYT Weeks in Treatment: 8 Active Inactive Abuse / Safety / Falls / Self Care Management Nursing Diagnoses: Impaired physical mobility Goals: Patient will remain injury free related to falls Date Initiated: 09/11/2018 Target Resolution Date: 11/29/2018 Goal Status: Active Interventions: Assess fall risk on admission and as needed Notes: Nutrition Nursing Diagnoses: Potential for alteratiion in Nutrition/Potential for imbalanced nutrition Goals: Patient/caregiver agrees to and verbalizes understanding of need to use nutritional supplements and/or vitamins as prescribed Date Initiated: 09/11/2018 Target Resolution Date: 11/29/2018 Goal Status: Active Interventions: Assess patient nutrition upon admission and as needed per policy Notes: Orientation to the Wound Care Program Nursing Diagnoses: Knowledge deficit related to the wound healing center program Goals: Patient/caregiver will verbalize understanding of the [facility] Date Initiated: 09/11/2018 Target Resolution Date: 11/29/2018 Goal Status: Active Interventions: Provide education on orientation to the wound center [name], [name] ([id number]) Notes: Wound/Skin Impairment Nursing Diagnoses: Impaired tissue integrity Goals: Ulcer/skin breakdown will heal within 14 weeks Date Initiated: 09/11/2018 Target Resolution Date: 11/29/2018 Goal Status: Active Interventions: Assess patient/caregiver ability to obtain necessary supplies Assess patient/caregiver ability to perform ulcer/skin care regimen upon admission and as needed Assess ulceration(s) every  visit Notes: Electronic Signature(s) Signed: 11/06/2018 4:47:47 PM By: Montey Hora Entered By: Montey Hora on 11/06/2018 13:00:55 [name], [name] ([id number]) -------------------------------------------------------------------------------- Pain Assessment Details Patient Name: [name]. Date of Service: 11/06/2018 12:30 PM Medical Record Number: [id number] Patient Account Number: [id number] Date of Birth/Sex: [birth year] (77 y.o. M) Treating RN: Montey Hora Primary Care Olaf Mesa: Tedra Senegal Other Clinician: Referring Christien Frankl: Tedra Senegal Treating Duglas Heier/Extender: [name], HOYT Weeks in Treatment: 8 Active Problems Location of Pain Severity and Description of Pain Patient Has Paino No Site Locations Pain Management and Medication Current Pain Management: Electronic Signature(s) Signed: 11/06/2018 4:04:17 PM By: Paulla Fore, RRT, CHT Signed: 11/06/2018 4:47:47 PM By: Montey Hora Entered By: Lorine Bears on 11/06/2018 12:40:42 [name], [name] ([id number]) -------------------------------------------------------------------------------- Patient/Caregiver Education Details Patient Name: [name]. Date of Service: 11/06/2018 12:30 PM Medical Record Number: [id number] Patient Account Number: [id number] Date of Birth/Gender: 06/09/1941 (78 y.o. M) Treating RN: Montey Hora Primary Care Physician: Tedra Senegal Other Clinician: Referring Physician: Tedra Senegal Treating Physician/Extender: Sharalyn Ink in Treatment: 8 Education Assessment Education Provided To: Patient Education Topics Provided Wound/Skin Impairment: Handouts: Other: wound care to  continue as ordered Methods: Demonstration, Explain/Verbal Responses: State content correctly Electronic Signature(s) Signed: 11/06/2018 4:47:47 PM By: Montey Hora Entered By: Montey Hora on 11/06/2018 13:04:24 [name], [name]  ([id number]) -------------------------------------------------------------------------------- Wound Assessment Details Patient Name: [name]. Date of Service: 11/06/2018 12:30 PM Medical Record Number: [id number] Patient Account Number: [id number] Date of Birth/Sex: 02-03-1941 (77 y.o. M) Treating RN: Cornell Barman Primary Care Tatyanna Cronk: Tedra Senegal Other Clinician: Referring Camar Guyton: Tedra Senegal Treating Shakela Donati/Extender: [name], HOYT Weeks in Treatment: 8 Wound Status Wound Number: 6 Primary Abscess Etiology: Wound Location: Right Abdomen - Lower Quadrant Wound Open Wounding Event: Blister Status: Date Acquired: 08/28/2018 Comorbid Anemia, Hypertension, Peripheral Venous Weeks Of Treatment: 8 History: Disease, Type II Diabetes, End Stage Renal Clustered Wound: No Disease, Gout, Neuropathy Photos Photo Uploaded By: Gretta Cool, BSN, RN, CWS, Kim on 11/06/2018 14:25:01 Wound Measurements Length: (cm) 0.5 Width: (cm) 0.3 Depth: (cm) 0.1 Area: (cm) 0.118 Volume: (cm) 0.012 % Reduction in Area: 99.7% % Reduction in Volume: 99.7% Epithelialization: Medium (34-66%) Tunneling: No Undermining: No Wound Description Full Thickness Without Exposed Support Classification: Structures Wound Margin: Flat and Intact Exudate Small Amount: Exudate Type: Serous Exudate Color: amber Foul Odor After Cleansing: No Slough/Fibrino Yes Wound Bed Granulation Amount: Large (67-100%) Exposed Structure Granulation Quality: Pink Fascia Exposed: No Necrotic Amount: Small (1-33%) Fat Layer (Subcutaneous Tissue) Exposed: Yes Necrotic Quality: Adherent Slough Tendon Exposed: No Muscle Exposed: No Joint Exposed: No Bone Exposed: No [name], [name] ([id number]) Periwound Skin Texture Texture Color No Abnormalities Noted: No No Abnormalities Noted: No Callus: No Atrophie Blanche: No Crepitus: No Cyanosis: No Excoriation: No Ecchymosis: No Induration: No Erythema: No Rash:  No Hemosiderin Staining: No Scarring: Yes Mottled: No Pallor: No Moisture Rubor: No No Abnormalities Noted: No Dry / Scaly: No Temperature / Pain Maceration: No Temperature: No Abnormality Tenderness on Palpation: Yes Wound Preparation Ulcer Cleansing: Rinsed/Irrigated with Saline Topical Anesthetic Applied: Other: lidocaine 4%, Treatment Notes Wound #6 (Right Abdomen - Lower Quadrant) Notes Hydrafera Blue and bordered foam dressing Electronic Signature(s) Signed: 11/07/2018 4:22:08 PM By: Gretta Cool, BSN, RN, CWS, Kim RN, BSN Entered By: Gretta Cool, BSN, RN, CWS, Kim on 11/06/2018 12:53:27 [name], [name] ([id number]) -------------------------------------------------------------------------------- Vitals Details Patient Name: [name]. Date of Service: 11/06/2018 12:30 PM Medical Record Number: [id number] Patient Account Number: [id number] Date of Birth/Sex: [birth year] (77 y.o. M) Treating RN: Montey Hora Primary Care Sandford Diop: Tedra Senegal Other Clinician: Referring Elizeth Weinrich: Tedra Senegal Treating Adarius Tigges/Extender: [name], HOYT Weeks in Treatment: 8 Vital Signs Time Taken: 12:42 Temperature (F): 97.8 Height (in): 71 Pulse (bpm): 76 Weight (lbs): 146 Respiratory Rate (breaths/min): 16 Body Mass Index (BMI): 20.4 Blood Pressure (mmHg): 105/58 Reference Range: 80 - 120 mg / dl Airway Electronic Signature(s) Signed: 11/06/2018 4:04:17 PM By: Lorine Bears RCP, RRT, CHT Entered By: Lorine Bears on 11/06/2018 12:57:35

## 2018-11-10 DIAGNOSIS — D509 Iron deficiency anemia, unspecified: Secondary | ICD-10-CM | POA: Diagnosis not present

## 2018-11-10 DIAGNOSIS — D631 Anemia in chronic kidney disease: Secondary | ICD-10-CM | POA: Diagnosis not present

## 2018-11-10 DIAGNOSIS — N2581 Secondary hyperparathyroidism of renal origin: Secondary | ICD-10-CM | POA: Diagnosis not present

## 2018-11-10 DIAGNOSIS — E1129 Type 2 diabetes mellitus with other diabetic kidney complication: Secondary | ICD-10-CM | POA: Diagnosis not present

## 2018-11-10 DIAGNOSIS — N186 End stage renal disease: Secondary | ICD-10-CM | POA: Diagnosis not present

## 2018-11-11 ENCOUNTER — Ambulatory Visit: Payer: Medicare Other | Admitting: Family Medicine

## 2018-11-11 ENCOUNTER — Ambulatory Visit: Payer: Medicare Other | Admitting: Neurology

## 2018-11-12 DIAGNOSIS — D631 Anemia in chronic kidney disease: Secondary | ICD-10-CM | POA: Diagnosis not present

## 2018-11-12 DIAGNOSIS — D509 Iron deficiency anemia, unspecified: Secondary | ICD-10-CM | POA: Diagnosis not present

## 2018-11-12 DIAGNOSIS — N186 End stage renal disease: Secondary | ICD-10-CM | POA: Diagnosis not present

## 2018-11-12 DIAGNOSIS — E1129 Type 2 diabetes mellitus with other diabetic kidney complication: Secondary | ICD-10-CM | POA: Diagnosis not present

## 2018-11-12 DIAGNOSIS — N2581 Secondary hyperparathyroidism of renal origin: Secondary | ICD-10-CM | POA: Diagnosis not present

## 2018-11-13 ENCOUNTER — Encounter: Payer: Medicare Other | Admitting: Physician Assistant

## 2018-11-13 ENCOUNTER — Other Ambulatory Visit (INDEPENDENT_AMBULATORY_CARE_PROVIDER_SITE_OTHER): Payer: Medicare Other

## 2018-11-13 DIAGNOSIS — Z992 Dependence on renal dialysis: Secondary | ICD-10-CM | POA: Diagnosis not present

## 2018-11-13 DIAGNOSIS — E1122 Type 2 diabetes mellitus with diabetic chronic kidney disease: Secondary | ICD-10-CM | POA: Diagnosis not present

## 2018-11-13 DIAGNOSIS — L98492 Non-pressure chronic ulcer of skin of other sites with fat layer exposed: Secondary | ICD-10-CM | POA: Diagnosis not present

## 2018-11-13 DIAGNOSIS — E1165 Type 2 diabetes mellitus with hyperglycemia: Secondary | ICD-10-CM | POA: Diagnosis not present

## 2018-11-13 DIAGNOSIS — N186 End stage renal disease: Secondary | ICD-10-CM | POA: Diagnosis not present

## 2018-11-13 DIAGNOSIS — E11622 Type 2 diabetes mellitus with other skin ulcer: Secondary | ICD-10-CM | POA: Diagnosis not present

## 2018-11-13 DIAGNOSIS — I120 Hypertensive chronic kidney disease with stage 5 chronic kidney disease or end stage renal disease: Secondary | ICD-10-CM | POA: Diagnosis not present

## 2018-11-13 LAB — GLUCOSE, RANDOM: Glucose, Bld: 128 mg/dL — ABNORMAL HIGH (ref 70–99)

## 2018-11-13 LAB — TSH: TSH: 1.77 u[IU]/mL (ref 0.35–4.50)

## 2018-11-14 DIAGNOSIS — N2581 Secondary hyperparathyroidism of renal origin: Secondary | ICD-10-CM | POA: Diagnosis not present

## 2018-11-14 DIAGNOSIS — N186 End stage renal disease: Secondary | ICD-10-CM | POA: Diagnosis not present

## 2018-11-14 DIAGNOSIS — D631 Anemia in chronic kidney disease: Secondary | ICD-10-CM | POA: Diagnosis not present

## 2018-11-14 DIAGNOSIS — D509 Iron deficiency anemia, unspecified: Secondary | ICD-10-CM | POA: Diagnosis not present

## 2018-11-14 DIAGNOSIS — E1129 Type 2 diabetes mellitus with other diabetic kidney complication: Secondary | ICD-10-CM | POA: Diagnosis not present

## 2018-11-14 LAB — FRUCTOSAMINE: Fructosamine: 344 umol/L — ABNORMAL HIGH (ref 0–285)

## 2018-11-17 DIAGNOSIS — D509 Iron deficiency anemia, unspecified: Secondary | ICD-10-CM | POA: Diagnosis not present

## 2018-11-17 DIAGNOSIS — E1129 Type 2 diabetes mellitus with other diabetic kidney complication: Secondary | ICD-10-CM | POA: Diagnosis not present

## 2018-11-17 DIAGNOSIS — N2581 Secondary hyperparathyroidism of renal origin: Secondary | ICD-10-CM | POA: Diagnosis not present

## 2018-11-17 DIAGNOSIS — D631 Anemia in chronic kidney disease: Secondary | ICD-10-CM | POA: Diagnosis not present

## 2018-11-17 DIAGNOSIS — N186 End stage renal disease: Secondary | ICD-10-CM | POA: Diagnosis not present

## 2018-11-18 ENCOUNTER — Ambulatory Visit: Payer: Medicare Other | Admitting: Endocrinology

## 2018-11-19 ENCOUNTER — Observation Stay (HOSPITAL_COMMUNITY): Payer: Medicare Other

## 2018-11-19 ENCOUNTER — Other Ambulatory Visit: Payer: Self-pay

## 2018-11-19 ENCOUNTER — Encounter (HOSPITAL_COMMUNITY): Payer: Self-pay | Admitting: Emergency Medicine

## 2018-11-19 ENCOUNTER — Inpatient Hospital Stay (HOSPITAL_COMMUNITY)
Admission: EM | Admit: 2018-11-19 | Discharge: 2018-11-21 | DRG: 377 | Disposition: A | Discharge status: Home / Self Care | Payer: Medicare Other | Attending: Internal Medicine | Admitting: Internal Medicine

## 2018-11-19 DIAGNOSIS — Z992 Dependence on renal dialysis: Secondary | ICD-10-CM | POA: Diagnosis not present

## 2018-11-19 DIAGNOSIS — Z95820 Peripheral vascular angioplasty status with implants and grafts: Secondary | ICD-10-CM

## 2018-11-19 DIAGNOSIS — K3189 Other diseases of stomach and duodenum: Secondary | ICD-10-CM | POA: Diagnosis present

## 2018-11-19 DIAGNOSIS — E039 Hypothyroidism, unspecified: Secondary | ICD-10-CM | POA: Diagnosis present

## 2018-11-19 DIAGNOSIS — D649 Anemia, unspecified: Secondary | ICD-10-CM | POA: Diagnosis not present

## 2018-11-19 DIAGNOSIS — K921 Melena: Secondary | ICD-10-CM | POA: Diagnosis not present

## 2018-11-19 DIAGNOSIS — G4733 Obstructive sleep apnea (adult) (pediatric): Secondary | ICD-10-CM | POA: Diagnosis present

## 2018-11-19 DIAGNOSIS — Z9181 History of falling: Secondary | ICD-10-CM

## 2018-11-19 DIAGNOSIS — K31819 Angiodysplasia of stomach and duodenum without bleeding: Secondary | ICD-10-CM | POA: Diagnosis not present

## 2018-11-19 DIAGNOSIS — E119 Type 2 diabetes mellitus without complications: Secondary | ICD-10-CM

## 2018-11-19 DIAGNOSIS — Z880 Allergy status to penicillin: Secondary | ICD-10-CM

## 2018-11-19 DIAGNOSIS — R1032 Left lower quadrant pain: Secondary | ICD-10-CM | POA: Diagnosis present

## 2018-11-19 DIAGNOSIS — R195 Other fecal abnormalities: Secondary | ICD-10-CM | POA: Diagnosis not present

## 2018-11-19 DIAGNOSIS — D638 Anemia in other chronic diseases classified elsewhere: Secondary | ICD-10-CM | POA: Diagnosis present

## 2018-11-19 DIAGNOSIS — I120 Hypertensive chronic kidney disease with stage 5 chronic kidney disease or end stage renal disease: Secondary | ICD-10-CM | POA: Diagnosis present

## 2018-11-19 DIAGNOSIS — I2510 Atherosclerotic heart disease of native coronary artery without angina pectoris: Secondary | ICD-10-CM | POA: Diagnosis present

## 2018-11-19 DIAGNOSIS — R531 Weakness: Secondary | ICD-10-CM | POA: Diagnosis present

## 2018-11-19 DIAGNOSIS — Z7984 Long term (current) use of oral hypoglycemic drugs: Secondary | ICD-10-CM

## 2018-11-19 DIAGNOSIS — Z833 Family history of diabetes mellitus: Secondary | ICD-10-CM

## 2018-11-19 DIAGNOSIS — N186 End stage renal disease: Secondary | ICD-10-CM | POA: Diagnosis not present

## 2018-11-19 DIAGNOSIS — D696 Thrombocytopenia, unspecified: Secondary | ICD-10-CM | POA: Diagnosis present

## 2018-11-19 DIAGNOSIS — I959 Hypotension, unspecified: Secondary | ICD-10-CM | POA: Diagnosis not present

## 2018-11-19 DIAGNOSIS — D631 Anemia in chronic kidney disease: Secondary | ICD-10-CM | POA: Diagnosis not present

## 2018-11-19 DIAGNOSIS — Z79899 Other long term (current) drug therapy: Secondary | ICD-10-CM

## 2018-11-19 DIAGNOSIS — E785 Hyperlipidemia, unspecified: Secondary | ICD-10-CM | POA: Diagnosis present

## 2018-11-19 DIAGNOSIS — N2581 Secondary hyperparathyroidism of renal origin: Secondary | ICD-10-CM | POA: Diagnosis present

## 2018-11-19 DIAGNOSIS — Z823 Family history of stroke: Secondary | ICD-10-CM

## 2018-11-19 DIAGNOSIS — I10 Essential (primary) hypertension: Secondary | ICD-10-CM | POA: Diagnosis present

## 2018-11-19 DIAGNOSIS — E1122 Type 2 diabetes mellitus with diabetic chronic kidney disease: Secondary | ICD-10-CM | POA: Diagnosis present

## 2018-11-19 DIAGNOSIS — Z87820 Personal history of traumatic brain injury: Secondary | ICD-10-CM

## 2018-11-19 DIAGNOSIS — Z8249 Family history of ischemic heart disease and other diseases of the circulatory system: Secondary | ICD-10-CM

## 2018-11-19 DIAGNOSIS — E1151 Type 2 diabetes mellitus with diabetic peripheral angiopathy without gangrene: Secondary | ICD-10-CM | POA: Diagnosis present

## 2018-11-19 DIAGNOSIS — D62 Acute posthemorrhagic anemia: Secondary | ICD-10-CM | POA: Diagnosis present

## 2018-11-19 DIAGNOSIS — K31811 Angiodysplasia of stomach and duodenum with bleeding: Principal | ICD-10-CM | POA: Diagnosis present

## 2018-11-19 DIAGNOSIS — K219 Gastro-esophageal reflux disease without esophagitis: Secondary | ICD-10-CM | POA: Diagnosis present

## 2018-11-19 DIAGNOSIS — Z87891 Personal history of nicotine dependence: Secondary | ICD-10-CM

## 2018-11-19 DIAGNOSIS — D539 Nutritional anemia, unspecified: Secondary | ICD-10-CM | POA: Diagnosis not present

## 2018-11-19 DIAGNOSIS — R51 Headache: Secondary | ICD-10-CM | POA: Diagnosis not present

## 2018-11-19 DIAGNOSIS — J309 Allergic rhinitis, unspecified: Secondary | ICD-10-CM | POA: Diagnosis present

## 2018-11-19 DIAGNOSIS — Z79890 Hormone replacement therapy (postmenopausal): Secondary | ICD-10-CM

## 2018-11-19 DIAGNOSIS — R197 Diarrhea, unspecified: Secondary | ICD-10-CM | POA: Diagnosis present

## 2018-11-19 DIAGNOSIS — Z9841 Cataract extraction status, right eye: Secondary | ICD-10-CM

## 2018-11-19 DIAGNOSIS — Z7951 Long term (current) use of inhaled steroids: Secondary | ICD-10-CM

## 2018-11-19 DIAGNOSIS — Z681 Body mass index (BMI) 19 or less, adult: Secondary | ICD-10-CM | POA: Diagnosis not present

## 2018-11-19 DIAGNOSIS — Z8679 Personal history of other diseases of the circulatory system: Secondary | ICD-10-CM

## 2018-11-19 DIAGNOSIS — K5521 Angiodysplasia of colon with hemorrhage: Secondary | ICD-10-CM | POA: Diagnosis present

## 2018-11-19 DIAGNOSIS — F0390 Unspecified dementia without behavioral disturbance: Secondary | ICD-10-CM | POA: Diagnosis present

## 2018-11-19 DIAGNOSIS — Z9842 Cataract extraction status, left eye: Secondary | ICD-10-CM

## 2018-11-19 DIAGNOSIS — M109 Gout, unspecified: Secondary | ICD-10-CM | POA: Diagnosis present

## 2018-11-19 LAB — CBC
HCT: 27.7 % — ABNORMAL LOW (ref 39.0–52.0)
HCT: 23.7 % — ABNORMAL LOW (ref 39.0–52.0)
Hemoglobin: 8.3 g/dL — ABNORMAL LOW (ref 13.0–17.0)
Hemoglobin: 7.6 g/dL — ABNORMAL LOW (ref 13.0–17.0)
MCH: 31.7 pg (ref 26.0–34.0)
MCH: 32.3 pg (ref 26.0–34.0)
MCHC: 30 g/dL (ref 30.0–36.0)
MCHC: 32.1 g/dL (ref 30.0–36.0)
MCV: 105.7 fL — ABNORMAL HIGH (ref 80.0–100.0)
MCV: 100.9 fL — ABNORMAL HIGH (ref 80.0–100.0)
Platelets: 138 10*3/uL — ABNORMAL LOW (ref 150–400)
Platelets: 129 10*3/uL — ABNORMAL LOW (ref 150–400)
RBC: 2.62 MIL/uL — ABNORMAL LOW (ref 4.22–5.81)
RBC: 2.35 MIL/uL — ABNORMAL LOW (ref 4.22–5.81)
RDW: 15.9 % — ABNORMAL HIGH (ref 11.5–15.5)
RDW: 15.7 % — ABNORMAL HIGH (ref 11.5–15.5)
WBC: 9.7 10*3/uL (ref 4.0–10.5)
WBC: 7.1 10*3/uL (ref 4.0–10.5)
nRBC: 0 % (ref 0.0–0.2)
nRBC: 0 % (ref 0.0–0.2)

## 2018-11-19 LAB — COMPREHENSIVE METABOLIC PANEL
ALT: 11 U/L (ref 0–44)
AST: 19 U/L (ref 15–41)
Albumin: 2.9 g/dL — ABNORMAL LOW (ref 3.5–5.0)
Alkaline Phosphatase: 80 U/L (ref 38–126)
Anion gap: 16 — ABNORMAL HIGH (ref 5–15)
BUN: 66 mg/dL — ABNORMAL HIGH (ref 8–23)
CO2: 24 mmol/L (ref 22–32)
Calcium: 8.6 mg/dL — ABNORMAL LOW (ref 8.9–10.3)
Chloride: 96 mmol/L — ABNORMAL LOW (ref 98–111)
Creatinine, Ser: 7.32 mg/dL — ABNORMAL HIGH (ref 0.61–1.24)
GFR calc Af Amer: 8 mL/min — ABNORMAL LOW (ref >60)
GFR calc non Af Amer: 7 mL/min — ABNORMAL LOW (ref >60)
Glucose, Bld: 218 mg/dL — ABNORMAL HIGH (ref 70–99)
Potassium: 4.1 mmol/L (ref 3.5–5.1)
Sodium: 136 mmol/L (ref 135–145)
Total Bilirubin: 0.1 mg/dL — ABNORMAL LOW (ref 0.3–1.2)
Total Protein: 5.9 g/dL — ABNORMAL LOW (ref 6.5–8.1)

## 2018-11-19 LAB — PREPARE RBC (CROSSMATCH)

## 2018-11-19 LAB — IRON AND TIBC
Iron: 21 ug/dL — ABNORMAL LOW (ref 45–182)
Saturation Ratios: 9 % — ABNORMAL LOW (ref 17.9–39.5)
TIBC: 223 ug/dL — ABNORMAL LOW (ref 250–450)
UIBC: 202 ug/dL

## 2018-11-19 LAB — GLUCOSE, CAPILLARY
Glucose-Capillary: 96 mg/dL (ref 70–99)
Glucose-Capillary: 130 mg/dL — ABNORMAL HIGH (ref 70–99)

## 2018-11-19 LAB — ABO/RH: ABO/RH(D): O POS

## 2018-11-19 LAB — FERRITIN: Ferritin: 193 ng/mL (ref 24–336)

## 2018-11-19 LAB — POC OCCULT BLOOD, ED: Fecal Occult Bld: POSITIVE — AB

## 2018-11-19 LAB — VITAMIN B12: Vitamin B-12: 445 pg/mL (ref 180–914)

## 2018-11-19 MED ORDER — ZOLPIDEM TARTRATE 5 MG PO TABS
5.0000 mg | ORAL_TABLET | Freq: Every evening | ORAL | Status: DC | PRN
  Administered 2018-11-20 – 2018-11-21 (×2): 5 mg via ORAL
  Filled 2018-11-19 (×2): qty 1

## 2018-11-19 MED ORDER — CINACALCET HCL 30 MG PO TABS
30.0000 mg | ORAL_TABLET | Freq: Every day | ORAL | Status: DC
  Administered 2018-11-19 – 2018-11-20 (×2): 30 mg via ORAL
  Filled 2018-11-19 (×2): qty 1

## 2018-11-19 MED ORDER — DOCUSATE SODIUM 283 MG RE ENEM
1.0000 | ENEMA | RECTAL | Status: DC | PRN
  Filled 2018-11-19: qty 1

## 2018-11-19 MED ORDER — SODIUM CHLORIDE 0.9 % IV SOLN
80.0000 mg | Freq: Once | INTRAVENOUS | Status: AC
  Administered 2018-11-19: 80 mg via INTRAVENOUS
  Filled 2018-11-19: qty 80

## 2018-11-19 MED ORDER — ONDANSETRON HCL 4 MG PO TABS: 4.0000 mg | ORAL_TABLET | Freq: Four times a day (QID) | ORAL | Status: DC | PRN

## 2018-11-19 MED ORDER — SODIUM CHLORIDE 0.9 % IV SOLN
INTRAVENOUS | Status: DC
  Administered 2018-11-20: 08:00:00 via INTRAVENOUS

## 2018-11-19 MED ORDER — ONDANSETRON HCL 4 MG/2ML IJ SOLN: 4.0000 mg | Freq: Four times a day (QID) | INTRAMUSCULAR | Status: DC | PRN

## 2018-11-19 MED ORDER — SODIUM CHLORIDE 0.9 % IV SOLN
8.0000 mg/h | INTRAVENOUS | Status: DC
  Administered 2018-11-19 – 2018-11-20 (×2): 8 mg/h via INTRAVENOUS
  Filled 2018-11-19 (×4): qty 80

## 2018-11-19 MED ORDER — CHLORHEXIDINE GLUCONATE CLOTH 2 % EX PADS
6.0000 | MEDICATED_PAD | Freq: Every day | CUTANEOUS | Status: DC
  Administered 2018-11-20: 6 via TOPICAL

## 2018-11-19 MED ORDER — CALCIUM CARBONATE ANTACID 1250 MG/5ML PO SUSP
500.0000 mg | Freq: Four times a day (QID) | ORAL | Status: DC | PRN
  Filled 2018-11-19: qty 5

## 2018-11-19 MED ORDER — SODIUM CHLORIDE 0.9% IV SOLUTION
Freq: Once | INTRAVENOUS | Status: AC
  Administered 2018-11-20: via INTRAVENOUS

## 2018-11-19 MED ORDER — NEPRO/CARBSTEADY PO LIQD
237.0000 mL | Freq: Three times a day (TID) | ORAL | Status: DC | PRN
  Filled 2018-11-19: qty 237

## 2018-11-19 MED ORDER — DARBEPOETIN ALFA 200 MCG/0.4ML IJ SOSY
200.0000 ug | PREFILLED_SYRINGE | INTRAMUSCULAR | Status: DC
  Administered 2018-11-20: 200 ug via INTRAVENOUS
  Filled 2018-11-19: qty 0.4

## 2018-11-19 MED ORDER — SIMVASTATIN 20 MG PO TABS
20.0000 mg | ORAL_TABLET | Freq: Every day | ORAL | Status: DC
  Administered 2018-11-19 – 2018-11-20 (×2): 20 mg via ORAL
  Filled 2018-11-19 (×2): qty 1

## 2018-11-19 MED ORDER — CAMPHOR-MENTHOL 0.5-0.5 % EX LOTN
1.0000 "application " | TOPICAL_LOTION | Freq: Three times a day (TID) | CUTANEOUS | Status: DC | PRN
  Filled 2018-11-19: qty 222

## 2018-11-19 MED ORDER — INSULIN ASPART 100 UNIT/ML ~~LOC~~ SOLN
0.0000 [IU] | Freq: Three times a day (TID) | SUBCUTANEOUS | Status: DC
  Administered 2018-11-20: 1 [IU] via SUBCUTANEOUS

## 2018-11-19 MED ORDER — ACETAMINOPHEN 325 MG PO TABS
650.0000 mg | ORAL_TABLET | Freq: Four times a day (QID) | ORAL | Status: DC | PRN
  Administered 2018-11-19 – 2018-11-21 (×2): 650 mg via ORAL
  Filled 2018-11-19 (×2): qty 2

## 2018-11-19 MED ORDER — LEVOTHYROXINE SODIUM 50 MCG PO TABS
50.0000 ug | ORAL_TABLET | Freq: Every day | ORAL | Status: DC
  Administered 2018-11-20 – 2018-11-21 (×2): 50 ug via ORAL
  Filled 2018-11-19 (×3): qty 1

## 2018-11-19 MED ORDER — MIDODRINE HCL 5 MG PO TABS
10.0000 mg | ORAL_TABLET | ORAL | Status: DC
  Administered 2018-11-20: 10 mg via ORAL
  Filled 2018-11-19: qty 2

## 2018-11-19 MED ORDER — SEVELAMER CARBONATE 800 MG PO TABS
1600.0000 mg | ORAL_TABLET | Freq: Three times a day (TID) | ORAL | Status: DC
  Administered 2018-11-19 – 2018-11-21 (×4): 1600 mg via ORAL
  Filled 2018-11-19 (×4): qty 2

## 2018-11-19 MED ORDER — MEMANTINE HCL 10 MG PO TABS
10.0000 mg | ORAL_TABLET | Freq: Two times a day (BID) | ORAL | Status: DC
  Administered 2018-11-19 – 2018-11-21 (×4): 10 mg via ORAL
  Filled 2018-11-19 (×5): qty 1

## 2018-11-19 MED ORDER — SORBITOL 70 % SOLN: 30.0000 mL | Status: DC | PRN

## 2018-11-19 MED ORDER — ACETAMINOPHEN 650 MG RE SUPP: 650.0000 mg | Freq: Four times a day (QID) | RECTAL | Status: DC | PRN

## 2018-11-19 MED ORDER — HYDROXYZINE HCL 25 MG PO TABS: 25.0000 mg | ORAL_TABLET | Freq: Three times a day (TID) | ORAL | Status: DC | PRN

## 2018-11-19 MED ORDER — ALLOPURINOL 100 MG PO TABS
100.0000 mg | ORAL_TABLET | Freq: Every day | ORAL | Status: DC
  Administered 2018-11-20 – 2018-11-21 (×2): 100 mg via ORAL
  Filled 2018-11-19 (×2): qty 1

## 2018-11-19 MED ORDER — PANTOPRAZOLE SODIUM 40 MG IV SOLR: 40.0000 mg | Freq: Two times a day (BID) | INTRAVENOUS | Status: DC

## 2018-11-19 NOTE — Consult Note (Signed)
Referring Provider: Dr. Karmen Bongo Primary Care Physician:  Renold Genta Cresenciano Lick, MD Primary Gastroenterologist:  Dr. Silverio Decamp  Reason for Consultation: Anemia, Melena  HPI: [name] is a 78 y.o. 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 S/P subdural evacuation 07/22/2018. He began passing moderate amounts of black tarry stool on Sunday 11/16/2018, about 5 episodes. He continued to pass tarry black stools on Monday and Tuesday. He developed nausea yesterday, no vomiting. He developed LLQ pain which radiates to his back yesterday which persists today. No fever, sweats or chills. He passed one small tarry black stool this am. Prior to Sunday, he was passing 1 to 2 normal brown formed stools daily. He denies taking any NSAIDS/Goody Powder. He presented to the ED today for further evaluation. His wife is present.  ED Course: Sodium 136.  Potassium 4.1.  Glucose 218.  BUN 66.  Creatinine 7.32.  Alk phos 80.  Albumin 2.9.  AST 19.  ALT 11.  Total protein 5.9.  Total bilirubin 0.1.  WBC 9.7.  Hemoglobin 8.3.  Hematocrit 27.7.  MCV 105.7.  Platelet 138.  TSH 1.77.  FOBT positive.  An abdominal/pelvic CT without contrast has been ordered but not yet completed.  GI history: 06/03/2018 Small Bowel Enteroscopy: no evidence of significant pathology in the duodenal bulb, in the first portion of the duodenum, in the second portion of the duodenum, in the third portion of the duodenum and in the fourth portion of the duodenum. The examined esophagus was normal. A single 3 mm stigmata of recent bleeding angioectasia was found in the gastric fundus. Coagulation for hemostasis using argon plasma at 1 liter/minute and 20 watts was successful.  EGD  11/12/2017: Mild distal esophageal stricture which was dilated otherwise negative exam. No evidence of H. Pylori.  Colonoscopy 11/12/2017: A 9 mm TA polyp was found in the transverse colon.  A 1 mm hyperplastic polyp was found in the recto-sigmoid colon. A single localized non-bleeding erosion was found in the transverse colon. No stigmata of recent bleeding was seen. A few small and large-mouthed diverticula were found in the sigmoid colon, descending colon, transverse colon, hepatic flexure and ascending colon. Non-bleeding internal hemorrhoids were found during retroflexion. The hemorrhoids were small.  History of ESRD on HD Q MWF. He stated over the past 10 days, each day he has dialysis his blood has clotted in the dialysis machine. He required higher doses of heparin during his dialysis treatments to prevent further clotting. He was referred to a vascular specialist, appointment was scheduled for tomorrow. His RUE fistula was placed 3+ years ago and was recently "cleanedout". [facility]      Past Medical History:  Diagnosis Date  . Allergy   . Anemia   . Arthritis   . Cataract    bil cateracts removed  . Coronary artery disease   . Dementia arising in the senium and presenium ([place])   . Diabetes mellitus    Type 2  . Diverticulitis   . ED (erectile dysfunction)   . Elevated homocysteine ([place])   . ESRD (end stage renal disease) on dialysis ([place]) 03/2015  . GERD (gastroesophageal reflux disease)    pepto   . Gout   . Hyperlipidemia   . Hypertension   .  Hypothyroidism   . Pneumonia   . PVD (peripheral vascular disease) ([facility])    has plastic aorta  . Seasonal allergies   . Sleep apnea    does not wear c-pap    Past Surgical History:  Procedure Laterality Date  . aortobifemoral bypass    . AV FISTULA PLACEMENT Left 12/01/2013   Procedure: ARTERIOVENOUS (AV) FISTULA CREATION- LEFT BRACHIOCEPHALIC;  Surgeon: Christopher S Dickson, MD;  Location: [facility] OR;  Service:  Vascular;  Laterality: Left;  . BASCILIC VEIN TRANSPOSITION Right 07/27/2014   Procedure: BASCILIC VEIN TRANSPOSITION;  Surgeon: Christopher S Dickson, MD;  Location: [facility] OR;  Service: Vascular;  Laterality: Right;  . BREAST SURGERY     left - granulomatous mastitis  . BURR HOLE Bilateral 07/22/2018   Procedure: BILATERAL BURR HOLES;  Surgeon: Elsner, Henry, MD;  Location: [facility] OR;  Service: Neurosurgery;  Laterality: Bilateral;  . COLONOSCOPY    . 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 ([facility] HX)  1992  . ENTEROSCOPY N/A 06/03/2018   Procedure: ENTEROSCOPY;  Surgeon: Cirigliano, Vito V, DO;  Location: [facility];  Service: Gastroenterology;  Laterality: N/A;  . EYE SURGERY Bilateral    cataracts  . HEMODIALYSIS INPATIENT  01/17/2018      . HOT HEMOSTASIS N/A 06/03/2018   Procedure: HOT HEMOSTASIS (ARGON PLASMA COAGULATION/BICAP);  Surgeon: Cirigliano, Vito V, DO;  Location: [facility];  Service: Gastroenterology;  Laterality: N/A;  . REVISON OF ARTERIOVENOUS FISTULA Left 02/09/2014   Procedure: REVISON OF LEFT ARTERIOVENOUS FISTULA - RESECTION OF RENDUNDANT VEIN;  Surgeon: Christopher S Dickson, MD;  Location: [facility] OR;  Service: Vascular;  Laterality: Left;  . SBO with lysis adhesions    . SHUNTOGRAM Left 04/19/2014   Procedure: FISTULOGRAM;  Surgeon: Christopher S Dickson, MD;  Location: [facility];  Service: Cardiovascular;  Laterality: Left;  . UNILATERAL UPPER EXTREMEITY ANGIOGRAM N/A 07/12/2014   Procedure: UNILATERAL UPPER EXTREMEITY ANGIOGRAM;  Surgeon: Christopher S Dickson, MD;  Location: [facility];  Service: Cardiovascular;  Laterality: N/A;    Prior to Admission medications   Medication Sig Start Date End Date Taking? Authorizing Provider  acetaminophen (TYLENOL) 325 MG tablet Take 1-2 tablets (325-650 mg total) by mouth every 4 (four) hours as needed for mild pain. 08/01/18   Love, Pamela S, PA-C  allopurinol (ZYLOPRIM) 100 MG tablet Take 100 mg by mouth daily as needed (pain).      [provider]  Blood Glucose Monitoring Suppl (FREESTYLE FREEDOM LITE) w/Device KIT Use to check blood sugar 2 times per day dx code E11.65 10/21/18   Kumar, Ajay, MD  Carboxymethylcellulose Sod PF 0.25 % SOLN Place 1 drop into both eyes as needed (dry eyes).    [provider]  cinacalcet (SENSIPAR) 30 MG tablet Take 1 tablet (30 mg total) by mouth daily with supper. 08/13/18   Love, Pamela S, PA-C  clobetasol (TEMOVATE) 0.05 % external solution Apply 1 application topically 2 (two) times daily as needed (irritation). Apply to scalp 04/19/15   [provider]  denosumab (PROLIA) 60 MG/ML SOSY injection Inject 60 mg into the skin every 6 (six) months.    [provider]  docusate sodium (COLACE) 100 MG capsule Take 1 capsule (100 mg total) by mouth daily. 08/13/18   Love, Pamela S, PA-C  FLUOCINOLONE ACETONIDE SCALP 0.01 % OIL Apply 1 application topically at bedtime. Apply to scalp 04/28/15   [provider]  fluticasone (CUTIVATE) 0.05 % cream Apply 1 application topically 2 (two)   times daily as needed (irritation). Apply to face 04/19/15   [provider]  fluticasone (FLONASE) 50 MCG/ACT nasal spray Place 1 spray into both nostrils daily. Patient taking differently: Place 1 spray into both nostrils daily as needed for allergies.  10/08/14   Mikhail, Velta Addison, DO  glucose blood (FREESTYLE LITE) test strip Use as instructed to check blood sugar 3 times daily. 04/22/18   Elayne Snare, MD  ketoconazole (NIZORAL) 2 % shampoo Apply 1 application topically 2 (two) times a week. Patient taking differently: Apply 1 application topically at bedtime.  03/31/15   Elby Showers, MD  Lancets (FREESTYLE) lancets Use as instructed to check blood sugar 2 times per day dx code E11.65 02/22/16   Elayne Snare, MD  levothyroxine (SYNTHROID, LEVOTHROID) 50 MCG tablet Take 1 tablet (50 mcg total) by mouth daily. 08/06/14   Elby Showers, MD  linagliptin (TRADJENTA) 5 MG TABS  tablet Take 1 tablet (5 mg total) by mouth daily. 10/30/18   Elayne Snare, MD  memantine (NAMENDA) 10 MG tablet Take 1 tablet (10 mg total) by mouth 2 (two) times daily. 09/15/18   Dennie Bible, NP  midodrine (PROAMATINE) 10 MG tablet Take one pill prior to hemodialysis on MWF 08/13/18   Love, Ivan Anchors, PA-C  Multiple Vitamins-Minerals (MULTIVITAMIN WITH MINERALS) tablet Take 1 tablet by mouth daily.    [provider]  Naftifine HCl (NAFTIN) 2 % CREA Apply to feet daily for fungal infection Patient taking differently: Apply 1 application topically daily as needed (for fungal infection). Apply to feet 09/12/15   Elby Showers, MD  Nutritional Supplements (FEEDING SUPPLEMENT, NEPRO CARB STEADY,) LIQD Take 237 mLs by mouth daily.    [provider]  omeprazole (PRILOSEC) 40 MG capsule Take 1 capsule (40 mg total) by mouth daily. 03/13/18   Esterwood, Amy S, PA-C  polyethylene glycol (MIRALAX / GLYCOLAX) packet Take 17 g by mouth daily. 08/13/18   Love, Ivan Anchors, PA-C  repaglinide (PRANDIN) 0.5 MG tablet Take 1 tablet (0.5 mg total) by mouth daily with lunch. 10/22/18   Elayne Snare, MD  senna (SENOKOT) 8.6 MG TABS tablet Take 1 tablet (8.6 mg total) by mouth at bedtime. 08/13/18   Love, Ivan Anchors, PA-C  sevelamer carbonate (RENVELA) 800 MG tablet Take 800 mg by mouth 3 (three) times daily before meals.    [provider]  simvastatin (ZOCOR) 20 MG tablet Take 20 mg by mouth at bedtime.     [provider]  vitamin B-12 (CYANOCOBALAMIN) 1000 MCG tablet Take 1 tablet (1,000 mcg total) by mouth daily. 06/03/18   Patrecia Pour, MD    Current Facility-Administered Medications  Medication Dose Route Frequency Provider Last Rate Last Dose  . 0.9 %  sodium chloride infusion   Intravenous Continuous Lajean Saver, MD      . pantoprazole (PROTONIX) 80 mg in sodium chloride 0.9 % 100 mL IVPB  80 mg Intravenous Once Albrizze, Kaitlyn E, PA-C      . pantoprazole (PROTONIX)  80 mg in sodium chloride 0.9 % 250 mL (0.32 mg/mL) infusion  8 mg/hr Intravenous Continuous Albrizze, Kaitlyn E, PA-C      . [START ON 11/23/2018] pantoprazole (PROTONIX) injection 40 mg  40 mg Intravenous Q12H Albrizze, Kaitlyn E, PA-C       Current Outpatient Medications  Medication Sig Dispense Refill  . acetaminophen (TYLENOL) 325 MG tablet Take 1-2 tablets (325-650 mg total) by mouth every 4 (four) hours as needed for  mild pain.    Marland Kitchen allopurinol (ZYLOPRIM) 100 MG tablet Take 100 mg by mouth daily as needed (pain).     . Blood Glucose Monitoring Suppl (FREESTYLE FREEDOM LITE) w/Device KIT Use to check blood sugar 2 times per day dx code E11.65 1 each 0  . Carboxymethylcellulose Sod PF 0.25 % SOLN Place 1 drop into both eyes as needed (dry eyes).    . cinacalcet (SENSIPAR) 30 MG tablet Take 1 tablet (30 mg total) by mouth daily with supper. 30 tablet 0  . clobetasol (TEMOVATE) 0.05 % external solution Apply 1 application topically 2 (two) times daily as needed (irritation). Apply to scalp  0  . denosumab (PROLIA) 60 MG/ML SOSY injection Inject 60 mg into the skin every 6 (six) months.    . docusate sodium (COLACE) 100 MG capsule Take 1 capsule (100 mg total) by mouth daily. 30 capsule 0  . FLUOCINOLONE ACETONIDE SCALP 0.01 % OIL Apply 1 application topically at bedtime. Apply to scalp  0  . fluticasone (CUTIVATE) 0.05 % cream Apply 1 application topically 2 (two) times daily as needed (irritation). Apply to face  0  . fluticasone (FLONASE) 50 MCG/ACT nasal spray Place 1 spray into both nostrils daily. (Patient taking differently: Place 1 spray into both nostrils daily as needed for allergies. ) 16 g 2  . glucose blood (FREESTYLE LITE) test strip Use as instructed to check blood sugar 3 times daily. 200 each 12  . ketoconazole (NIZORAL) 2 % shampoo Apply 1 application topically 2 (two) times a week. (Patient taking differently: Apply 1 application topically at bedtime. ) 120 mL 0  . Lancets  (FREESTYLE) lancets Use as instructed to check blood sugar 2 times per day dx code E11.65 100 each 3  . levothyroxine (SYNTHROID, LEVOTHROID) 50 MCG tablet Take 1 tablet (50 mcg total) by mouth daily. 90 tablet 1  . linagliptin (TRADJENTA) 5 MG TABS tablet Take 1 tablet (5 mg total) by mouth daily. 90 tablet 3  . memantine (NAMENDA) 10 MG tablet Take 1 tablet (10 mg total) by mouth 2 (two) times daily. 60 tablet 1  . midodrine (PROAMATINE) 10 MG tablet Take one pill prior to hemodialysis on MWF    . Multiple Vitamins-Minerals (MULTIVITAMIN WITH MINERALS) tablet Take 1 tablet by mouth daily.    . Naftifine HCl (NAFTIN) 2 % CREA Apply to feet daily for fungal infection (Patient taking differently: Apply 1 application topically daily as needed (for fungal infection). Apply to feet) 60 g prn  . Nutritional Supplements (FEEDING SUPPLEMENT, NEPRO CARB STEADY,) LIQD Take 237 mLs by mouth daily.    Marland Kitchen omeprazole (PRILOSEC) 40 MG capsule Take 1 capsule (40 mg total) by mouth daily. 30 capsule 0  . polyethylene glycol (MIRALAX / GLYCOLAX) packet Take 17 g by mouth daily. 14 each 0  . repaglinide (PRANDIN) 0.5 MG tablet Take 1 tablet (0.5 mg total) by mouth daily with lunch. 30 tablet 3  . senna (SENOKOT) 8.6 MG TABS tablet Take 1 tablet (8.6 mg total) by mouth at bedtime. 120 each 0  . sevelamer carbonate (RENVELA) 800 MG tablet Take 800 mg by mouth 3 (three) times daily before meals.    . simvastatin (ZOCOR) 20 MG tablet Take 20 mg by mouth at bedtime.     . vitamin B-12 (CYANOCOBALAMIN) 1000 MCG tablet Take 1 tablet (1,000 mcg total) by mouth daily. 30 tablet 0    Allergies as of 11/19/2018 - Review Complete 11/19/2018  Allergen Reaction Noted  .  Penicillins Rash     Family History  Problem Relation Age of Onset  . Aneurysm Mother   . Heart disease Father   . Stroke Father   . Hypertension Father   . Diabetes Father   . Dementia Neg Hx   . Colon cancer Neg Hx   . Esophageal cancer Neg Hx   .  Pancreatic cancer Neg Hx   . Prostate cancer Neg Hx   . Rectal cancer Neg Hx   . Stomach cancer Neg Hx     Social History   Socioeconomic History  . Marital status: Married    Spouse name: [name] Mood  . Number of children: 1  . Years of education: 77  . Highest education level: Not on file  Occupational History  . Occupation: Retired  Scientific laboratory technician  . Financial resource strain: Not on file  . Food insecurity:    Worry: Not on file    Inability: Not on file  . Transportation needs:    Medical: No    Non-medical: No  Tobacco Use  . Smoking status: Former Smoker    Last attempt to quit: 09/24/1994    Years since quitting: 24.1  . Smokeless tobacco: Never Used  Substance and Sexual Activity  . Alcohol use: No    Alcohol/week: 0.0 standard drinks    Comment: Quit Oct. 1977 ("somewhat heavy")  . Drug use: No  . Sexual activity: Not on file  Lifestyle  . Physical activity:    Days per week: 3 days    Minutes per session: 10 min  . Stress: Not at all  Relationships  . Social connections:    Talks on phone: Not on file    Gets together: Not on file    Attends religious service: Not on file    Active member of club or organization: Not on file    Attends meetings of clubs or organizations: Not on file    Relationship status: Not on file  . Intimate partner violence:    Fear of current or ex partner: Not on file    Emotionally abused: Not on file    Physically abused: Not on file    Forced sexual activity: Not on file  Other Topics Concern  . Not on file  Social History Narrative   Lives at home with wife.   Caffeine use: Drinks no soda or tea.    Drinks 1 cup coffee/week   Right handed    Review of Systems: Gen: Denies any fever, chills, sweats, anorexia, fatigue, weakness, malaise, weight loss, and sleep disorder CV: Denies chest pain, angina, palpitations, syncope, orthopnea, PND, peripheral edema, and claudication. Resp: Denies dyspnea at rest, dyspnea with  exercise, cough, sputum, wheezing, coughing up blood, and pleurisy. GI: Denies vomiting blood, jaundice, and fecal incontinence.   Denies dysphagia or odynophagia. GU : Denies urinary burning, blood in urine, urinary frequency, urinary hesitancy, nocturnal urination, and urinary incontinence. MS: Denies joint pain, limitation of movement, and swelling, stiffness, low back pain, extremity pain. Denies muscle weakness, cramps, atrophy.  Derm: Denies rash, itching, dry skin, hives, moles, warts, or unhealing ulcers.  Psych: Denies depression, anxiety, memory loss, suicidal ideation, hallucinations, paranoia, and confusion. Heme: Denies bruising, bleeding, and enlarged lymph nodes. Neuro:  Denies any headaches, dizziness, paresthesias. Endo:  Denies any problems with DM, thyroid, adrenal function.  Physical Exam: Vital signs in last 24 hours: Temp:  [97.5 F (36.4 C)] 97.5 F (36.4 C) (02/26 1145) Pulse Rate:  [71-80] 75 (  02/26 1400) Resp:  [16-18] 18 (02/26 1400) BP: (100-163)/(59-100) 117/100 (02/26 1345) SpO2:  [100 %] 100 % (02/26 1400) Weight:  [61.7 kg] 61.7 kg (02/26 1149)   General:   Alert,  Well-developed, well-nourished, pleasant and cooperative in NAD Eyes:  Sclera clear, no icterus.   Conjunctiva pink. Lungs:  Clear throughout to auscultation.   No wheezes, crackles, or rhonchi.  Heart:  Regular rate and rhythm; no murmurs, clicks, rubs, or gallops. Abdomen:  Soft, mild LLQ tenderness without rebound or guarding, extensive midline abdominal scare with drsg to lower abdominal wound intact.   Rectal:  Deferred  Msk:  Symmetrical without gross deformities. . Pulses:  Normal pulses noted. Extremities:  Without clubbing or edema. Neurologic:  Alert and  oriented x4;  grossly normal neurologically. Skin:  Intact without significant lesions or rashes.. Psych:  Alert and cooperative. Normal mood and affect.  Intake/Output from previous day: No intake/output data  recorded. Intake/Output this shift: No intake/output data recorded.  Lab Results: Recent Labs    11/19/18 1300  WBC 9.7  HGB 8.3*  HCT 27.7*  PLT 138*   BMET Recent Labs    11/19/18 1300  NA 136  K 4.1  CL 96*  CO2 24  GLUCOSE 218*  BUN 66*  CREATININE 7.32*  CALCIUM 8.6*   LFT Recent Labs    11/19/18 1300  PROT 5.9*  ALBUMIN 2.9*  AST 19  ALT 11  ALKPHOS 80  BILITOT 0.1*   No results found.  IMPRESSION/PLAN 29. 78 year old male with GI Bleed, melena, prior hx of GIB 05/2018 secondary to gastric AVMs. check CBC, iron, TIBC, Ferritin and Vitamin B 12 this evening  -Small bowel enteroscopy scheduled 11/20/2018 -monitor H/H closely, transfuse PRBCs for Hg < 8. -full liquid diet, then NPO after midnight.   2. Acute on chronic anemia secondary to ESRD and GI bleeding. New macrocytic anemia. -see plan #1  2. LLQ abdominal pain. History of diverticulosis. ? Hx of diverticulitis -abd/pelvic CT without contrast ordered  -monitor pain, WBC and temperature -pain management per hospitalist  3. ESRD on HD every MWF. Recently requiring higher doses of heparin during dialysis due to his blood was clotting on the dialyzer machine for the past 1 1/2 weeks -recommend nephrology to follow        Noralyn Pick  11/19/2018, 3:13 PM

## 2018-11-19 NOTE — Consult Note (Addendum)
[facility] Renal Consultation Note    Indication for Consultation:  Management of ESRD/hemodialysis, anemia, hypertension/volume, and secondary hyperparathyroidism. PCP:  HPI: [name] is a 78 y.o. male with ESRD, HTN, Type 2 DM, hypothyroidism, dementia, PVD, GERD, Hx GI bleed (05/2018), and Hx SDH (07/2018) who is being admitted with melena.  Pt c/o 4 episodes of bloody/blackish diarrhea yesterday and one this morning which prompted ED evaluation. C/o mild RLQ abd pain, no CP or dyspnea. No fever or chills. Initial labs showed K 4.1, Glu 218, Alb 2.9, WBC 9.7, Hgb 8.3, Plts 138. FOBT positive. 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 No bleeding issues from then until this episode.Hx AAA repair. His symptoms began on Sunday [name]  Dialyzes on MWF schedule at [facility]. Last HD 2/24 which he completed without issues. Uses RUE AVF as his access. Does not get heparim with HD d/t Hx GI bleeding and Hx subdural hematoma.  Past Medical History:  Diagnosis Date  . Allergy   . Anemia   . Arthritis   . Cataract    bil cateracts removed  . Coronary artery disease   . Dementia arising in the senium and presenium ([place])   . Diabetes mellitus    Type 2  . Diverticulitis   . ED (erectile dysfunction)   . Elevated homocysteine ([place])   . ESRD (end stage renal disease) on dialysis ([place]) 03/2015  . GERD (gastroesophageal reflux disease)    pepto   . Gout   . Hyperlipidemia   . Hypertension   . Hypothyroidism   . Pneumonia   . PVD (peripheral vascular disease) ([place])    has plastic aorta  . Seasonal allergies   . Sleep apnea    does not wear c-pap   Past Surgical History:  Procedure Laterality Date  . aortobifemoral bypass    . AV FISTULA PLACEMENT Left 12/01/2013   Procedure: ARTERIOVENOUS (AV) FISTULA CREATION- LEFT BRACHIOCEPHALIC;  Surgeon: Angelia Mould, MD;  Location: [place];  Service: Vascular;  Laterality:  Left;  . [place] TRANSPOSITION Right 07/27/2014   Procedure: BASCILIC VEIN TRANSPOSITION;  Surgeon: Angelia Mould, MD;  Location: [place];  Service: Vascular;  Laterality: Right;  . BREAST SURGERY     left - granulomatous mastitis  . BURR HOLE Bilateral 07/22/2018   Procedure: BILATERAL BURR HOLES;  Surgeon: Kristeen Miss, MD;  Location: [place];  Service: Neurosurgery;  Laterality: Bilateral;  . COLONOSCOPY    . 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 ([place] [facility])  1992  . ENTEROSCOPY N/A 06/03/2018   Procedure: ENTEROSCOPY;  Surgeon: Lavena Bullion, DO;  Location: [place];  Service: Gastroenterology;  Laterality: N/A;  . EYE SURGERY Bilateral    cataracts  . HEMODIALYSIS INPATIENT  01/17/2018      . HOT HEMOSTASIS N/A 06/03/2018   Procedure: HOT HEMOSTASIS (ARGON PLASMA COAGULATION/BICAP);  Surgeon: Lavena Bullion, DO;  Location: [facility] ENDOSCOPY;  Service: Gastroenterology;  Laterality: N/A;  . REVISON OF ARTERIOVENOUS FISTULA Left 02/09/2014   Procedure: REVISON OF LEFT ARTERIOVENOUS FISTULA - RESECTION OF RENDUNDANT VEIN;  Surgeon: Angelia Mould, MD;  Location: [place];  Service: Vascular;  Laterality: Left;  . SBO with lysis adhesions    . SHUNTOGRAM Left 04/19/2014   Procedure: FISTULOGRAM;  Surgeon: Angelia Mould, MD;  Location[facility] C[facility] LAB;  Service: Cardiovascular;  Laterality: Left;  . UNILATERAL UPPER EXTREMEITY ANGIOGRAM N/A 07/12/2014  Procedure: UNILATERAL UPPER Anselmo Rod;  Surgeon: Angelia Mould, MD;  Location: [facility] CATH LAB;  Service: Cardiovascular;  Laterality: N/A;   Family History  Problem Relation Age of Onset  . Aneurysm Mother   . Heart disease Father   . Stroke Father   . Hypertension Father   . Diabetes Father   . Dementia Neg Hx   . Colon cancer Neg Hx   . Esophageal cancer Neg Hx   . Pancreatic cancer Neg Hx   . Prostate cancer Neg Hx   . Rectal cancer Neg Hx   . Stomach cancer Neg Hx    Social History:  reports  that he quit smoking about 24 years ago. He has never used smokeless tobacco. He reports that he does not drink alcohol or use drugs.  ROS: As per HPI otherwise negative.  Physical Exam: Vitals:   11/19/18 1315 11/19/18 1330 11/19/18 1345 11/19/18 1400  BP: (!) 163/75 (!) 100/59 (!) 117/100   Pulse:  71 80 75  Resp:  18 16 18   Temp:      TempSrc:      SpO2:  100% 100% 100%  Weight:      Height:         General: Well developed, well nourished, in no acute distress. Head: Normocephalic, atraumatic, sclera non-icteric, mucus membranes are moist.Upper plate, ears ok, DM retinopathy Neck: Supple PC lymphadenopathy/masses. JVD not elevated. Lungs: Clear bilaterally to auscultation without wheezes, rales, or rhonchi.Decreased bs Heart: RRR; 2/6 systolic murmur Abdomen: Soft, mild tenderness in RLQ with deep palpation. No guarding. Central abd/femoral bruits heard. Musculoskeletal:  Strength and tone appear normal for age. Lower extremities: No B LE edema. Absent palpable pedal pulses Neuro: Alert and oriented X 3. Moves all extremities spontaneously. Psych:  Responds to questions appropriately with a normal affect. Dialysis Access: RUE AVF + thrill  Allergies  Allergen Reactions  . Penicillins Rash    Has patient had a PCN reaction causing immediate rash, facial/tongue/throat swelling, SOB or lightheadedness with hypotension: Yes Has patient had a PCN reaction causing severe rash involving mucus membranes or skin necrosis: Yes Has patient had a PCN reaction that required hospitalization: No Has patient had a PCN reaction occurring within the last 10 years: No If all of the above answers are "NO", then may proceed with Cephalosporin use.    Prior to Admission medications   Medication Sig Start Date End Date Taking? Authorizing Provider  acetaminophen (TYLENOL) 325 MG tablet Take 1-2 tablets (325-650 mg total) by mouth every 4 (four) hours as needed for mild pain. 08/01/18   Love,  Ivan Anchors, PA-C  allopurinol (ZYLOPRIM) 100 MG tablet Take 100 mg by mouth daily as needed (pain).     [provider]  Blood Glucose Monitoring Suppl (FREESTYLE FREEDOM LITE) w/Device KIT Use to check blood sugar 2 times per day dx code E11.65 10/21/18   Elayne Snare, MD  Carboxymethylcellulose Sod PF 0.25 % SOLN Place 1 drop into both eyes as needed (dry eyes).    [provider]  cinacalcet (SENSIPAR) 30 MG tablet Take 1 tablet (30 mg total) by mouth daily with supper. 08/13/18   Love, Ivan Anchors, PA-C  clobetasol (TEMOVATE) 0.05 % external solution Apply 1 application topically 2 (two) times daily as needed (irritation). Apply to scalp 04/19/15   [provider]  denosumab (PROLIA) 60 MG/ML SOSY injection Inject 60 mg into the skin every 6 (six) months.    [provider]  docusate  sodium (COLACE) 100 MG capsule Take 1 capsule (100 mg total) by mouth daily. 08/13/18   Love, Ivan Anchors, PA-C  FLUOCINOLONE ACETONIDE SCALP 0.01 % OIL Apply 1 application topically at bedtime. Apply to scalp 04/28/15   [provider]  fluticasone (CUTIVATE) 0.05 % cream Apply 1 application topically 2 (two) times daily as needed (irritation). Apply to face 04/19/15   [provider]  fluticasone (FLONASE) 50 MCG/ACT nasal spray Place 1 spray into both nostrils daily. Patient taking differently: Place 1 spray into both nostrils daily as needed for allergies.  10/08/14   Mikhail, Velta Addison, DO  glucose blood (FREESTYLE LITE) test strip Use as instructed to check blood sugar 3 times daily. 04/22/18   Elayne Snare, MD  ketoconazole (NIZORAL) 2 % shampoo Apply 1 application topically 2 (two) times a week. Patient taking differently: Apply 1 application topically at bedtime.  03/31/15   Elby Showers, MD  Lancets (FREESTYLE) lancets Use as instructed to check blood sugar 2 times per day dx code E11.65 02/22/16   Elayne Snare, MD  levothyroxine (SYNTHROID, LEVOTHROID) 50 MCG tablet Take 1  tablet (50 mcg total) by mouth daily. 08/06/14   Elby Showers, MD  linagliptin (TRADJENTA) 5 MG TABS tablet Take 1 tablet (5 mg total) by mouth daily. 10/30/18   Elayne Snare, MD  memantine (NAMENDA) 10 MG tablet Take 1 tablet (10 mg total) by mouth 2 (two) times daily. 09/15/18   Dennie Bible, NP  midodrine (PROAMATINE) 10 MG tablet Take one pill prior to hemodialysis on MWF 08/13/18   Love, Ivan Anchors, PA-C  Multiple Vitamins-Minerals (MULTIVITAMIN WITH MINERALS) tablet Take 1 tablet by mouth daily.    [provider]  Naftifine HCl (NAFTIN) 2 % CREA Apply to feet daily for fungal infection Patient taking differently: Apply 1 application topically daily as needed (for fungal infection). Apply to feet 09/12/15   Elby Showers, MD  Nutritional Supplements (FEEDING SUPPLEMENT, NEPRO CARB STEADY,) LIQD Take 237 mLs by mouth daily.    [provider]  omeprazole (PRILOSEC) 40 MG capsule Take 1 capsule (40 mg total) by mouth daily. 03/13/18   Esterwood, Amy S, PA-C  polyethylene glycol (MIRALAX / GLYCOLAX) packet Take 17 g by mouth daily. 08/13/18   Love, Ivan Anchors, PA-C  repaglinide (PRANDIN) 0.5 MG tablet Take 1 tablet (0.5 mg total) by mouth daily with lunch. 10/22/18   Elayne Snare, MD  senna (SENOKOT) 8.6 MG TABS tablet Take 1 tablet (8.6 mg total) by mouth at bedtime. 08/13/18   Love, Ivan Anchors, PA-C  sevelamer carbonate (RENVELA) 800 MG tablet Take 800 mg by mouth 3 (three) times daily before meals.    [provider]  simvastatin (ZOCOR) 20 MG tablet Take 20 mg by mouth at bedtime.     [provider]  vitamin B-12 (CYANOCOBALAMIN) 1000 MCG tablet Take 1 tablet (1,000 mcg total) by mouth daily. 06/03/18   Patrecia Pour, MD   Current Facility-Administered Medications  Medication Dose Route Frequency Provider Last Rate Last Dose  . 0.9 %  sodium chloride infusion   Intravenous Continuous Lajean Saver, MD      . pantoprazole (PROTONIX) 80 mg in sodium  chloride 0.9 % 100 mL IVPB  80 mg Intravenous Once Albrizze, Kaitlyn E, PA-C      . pantoprazole (PROTONIX) 80 mg in sodium chloride 0.9 % 250 mL (0.32 mg/mL) infusion  8 mg/hr Intravenous Continuous Albrizze, Kaitlyn E, PA-C      . [  START ON 11/23/2018] pantoprazole (PROTONIX) injection 40 mg  40 mg Intravenous Q12H Albrizze, Harley Hallmark, PA-C       Current Outpatient Medications  Medication Sig Dispense Refill  . acetaminophen (TYLENOL) 325 MG tablet Take 1-2 tablets (325-650 mg total) by mouth every 4 (four) hours as needed for mild pain.    Marland Kitchen allopurinol (ZYLOPRIM) 100 MG tablet Take 100 mg by mouth daily as needed (pain).     . Blood Glucose Monitoring Suppl (FREESTYLE FREEDOM LITE) w/Device KIT Use to check blood sugar 2 times per day dx code E11.65 1 each 0  . Carboxymethylcellulose Sod PF 0.25 % SOLN Place 1 drop into both eyes as needed (dry eyes).    . cinacalcet (SENSIPAR) 30 MG tablet Take 1 tablet (30 mg total) by mouth daily with supper. 30 tablet 0  . clobetasol (TEMOVATE) 0.05 % external solution Apply 1 application topically 2 (two) times daily as needed (irritation). Apply to scalp  0  . denosumab (PROLIA) 60 MG/ML SOSY injection Inject 60 mg into the skin every 6 (six) months.    . docusate sodium (COLACE) 100 MG capsule Take 1 capsule (100 mg total) by mouth daily. 30 capsule 0  . FLUOCINOLONE ACETONIDE SCALP 0.01 % OIL Apply 1 application topically at bedtime. Apply to scalp  0  . fluticasone (CUTIVATE) 0.05 % cream Apply 1 application topically 2 (two) times daily as needed (irritation). Apply to face  0  . fluticasone (FLONASE) 50 MCG/ACT nasal spray Place 1 spray into both nostrils daily. (Patient taking differently: Place 1 spray into both nostrils daily as needed for allergies. ) 16 g 2  . glucose blood (FREESTYLE LITE) test strip Use as instructed to check blood sugar 3 times daily. 200 each 12  . ketoconazole (NIZORAL) 2 % shampoo Apply 1 application topically 2 (two) times  a week. (Patient taking differently: Apply 1 application topically at bedtime. ) 120 mL 0  . Lancets (FREESTYLE) lancets Use as instructed to check blood sugar 2 times per day dx code E11.65 100 each 3  . levothyroxine (SYNTHROID, LEVOTHROID) 50 MCG tablet Take 1 tablet (50 mcg total) by mouth daily. 90 tablet 1  . linagliptin (TRADJENTA) 5 MG TABS tablet Take 1 tablet (5 mg total) by mouth daily. 90 tablet 3  . memantine (NAMENDA) 10 MG tablet Take 1 tablet (10 mg total) by mouth 2 (two) times daily. 60 tablet 1  . midodrine (PROAMATINE) 10 MG tablet Take one pill prior to hemodialysis on MWF    . Multiple Vitamins-Minerals (MULTIVITAMIN WITH MINERALS) tablet Take 1 tablet by mouth daily.    . Naftifine HCl (NAFTIN) 2 % CREA Apply to feet daily for fungal infection (Patient taking differently: Apply 1 application topically daily as needed (for fungal infection). Apply to feet) 60 g prn  . Nutritional Supplements (FEEDING SUPPLEMENT, NEPRO CARB STEADY,) LIQD Take 237 mLs by mouth daily.    Marland Kitchen omeprazole (PRILOSEC) 40 MG capsule Take 1 capsule (40 mg total) by mouth daily. 30 capsule 0  . polyethylene glycol (MIRALAX / GLYCOLAX) packet Take 17 g by mouth daily. 14 each 0  . repaglinide (PRANDIN) 0.5 MG tablet Take 1 tablet (0.5 mg total) by mouth daily with lunch. 30 tablet 3  . senna (SENOKOT) 8.6 MG TABS tablet Take 1 tablet (8.6 mg total) by mouth at bedtime. 120 each 0  . sevelamer carbonate (RENVELA) 800 MG tablet Take 800 mg by mouth 3 (three) times daily before meals.    Marland Kitchen  simvastatin (ZOCOR) 20 MG tablet Take 20 mg by mouth at bedtime.     . vitamin B-12 (CYANOCOBALAMIN) 1000 MCG tablet Take 1 tablet (1,000 mcg total) by mouth daily. 30 tablet 0   Labs: Basic Metabolic Panel: Recent Labs  Lab 11/13/18 1059 11/19/18 1300  NA  --  136  K  --  4.1  CL  --  96*  CO2  --  24  GLUCOSE 128* 218*  BUN  --  66*  CREATININE  --  7.32*  CALCIUM  --  8.6*   Liver Function Tests: Recent  Labs  Lab 11/19/18 1300  AST 19  ALT 11  ALKPHOS 80  BILITOT 0.1*  PROT 5.9*  ALBUMIN 2.9*   CBC: Recent Labs  Lab 11/19/18 1300  WBC 9.7  HGB 8.3*  HCT 27.7*  MCV 105.7*  PLT 138*   Dialysis Orders:  MWF schedule at [facility], 400/800, EDW 64.5kg - although not reaching recently, leaving consistently ~66kg, 2K/2.5Ca, AVF, no hep - Venofer 59m IV weekly - Mircera 153m IV q 2 weeks (last 2/5) - Hgb 10.4 on 2/19.  Assessment/Plan: 1.  Melena: Per Hx and FOBT +. Going for abdominal CT now. Per hospitalist. 2.  ESRD: Usual MWF schedule - missed his HD today. Will plan on HD for him, likely tomorrow AM. 3.  Hypertension/volume: BP variable (slightly high at this moment), no volume excess - UF as tolerated. 4.  Anemia (ABLA): Hgb 10.4 on 2/19 -> 8.3 today. Continue to trend. Due for ESA - will give tomorrow. 5.  Metabolic bone disease: Ca ok, Phos pending. Continue home binders. No VDRA at this time (recently held as outpt for ^ Ca) - trend labs. 6.  Nutrition: Alb low, will start protein supplements once cleared to eat. 7.  Type 2 DM 8.  Dementia 9.  PVD  Johnny PentonPAHershal [name], [name]  [name] [name], [name],[name], Johnny patient and wife.  Will do HD if admitted tomorrow, otherwise send to his outpatient unit for HD in [name] .  JaJeneen [name] 11/19/2018, 3:57 [name]

## 2018-11-19 NOTE — H&P (View-Only) (Signed)
Referring Provider: Dr. Karmen Bongo Primary Care Physician:  Renold Genta Cresenciano Lick, MD Primary Gastroenterologist:  Dr. Silverio Decamp  Reason for Consultation: Anemia, Melena  HPI: [name] is a 78 y.o. 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 S/P subdural evacuation 07/22/2018. He began passing moderate amounts of black tarry stool on Sunday 11/16/2018, about 5 episodes. He continued to pass tarry black stools on Monday and Tuesday. He developed nausea yesterday, no vomiting. He developed LLQ pain which radiates to his back yesterday which persists today. No fever, sweats or chills. He passed one small tarry black stool this am. Prior to Sunday, he was passing 1 to 2 normal brown formed stools daily. He denies taking any NSAIDS/Goody Powder. He presented to the ED today for further evaluation. His wife is present.  ED Course: Sodium 136.  Potassium 4.1.  Glucose 218.  BUN 66.  Creatinine 7.32.  Alk phos 80.  Albumin 2.9.  AST 19.  ALT 11.  Total protein 5.9.  Total bilirubin 0.1.  WBC 9.7.  Hemoglobin 8.3.  Hematocrit 27.7.  MCV 105.7.  Platelet 138.  TSH 1.77.  FOBT positive.  An abdominal/pelvic CT without contrast has been ordered but not yet completed.  GI history: 06/03/2018 Small Bowel Enteroscopy: no evidence of significant pathology in the duodenal bulb, in the first portion of the duodenum, in the second portion of the duodenum, in the third portion of the duodenum and in the fourth portion of the duodenum. The examined esophagus was normal. A single 3 mm stigmata of recent bleeding angioectasia was found in the gastric fundus. Coagulation for hemostasis using argon plasma at 1 liter/minute and 20 watts was successful.  EGD  11/12/2017: Mild distal esophageal stricture which was dilated otherwise negative exam. No evidence of H. Pylori.  Colonoscopy 11/12/2017: A 9 mm TA polyp was found in the transverse colon.  A 1 mm hyperplastic polyp was found in the recto-sigmoid colon. A single localized non-bleeding erosion was found in the transverse colon. No stigmata of recent bleeding was seen. A few small and large-mouthed diverticula were found in the sigmoid colon, descending colon, transverse colon, hepatic flexure and ascending colon. Non-bleeding internal hemorrhoids were found during retroflexion. The hemorrhoids were small.  History of ESRD on HD Q MWF. He stated over the past 10 days, each day he has dialysis his blood has clotted in the dialysis machine. He required higher doses of heparin during his dialysis treatments to prevent further clotting. He was referred to a vascular specialist, appointment was scheduled for tomorrow. His RUE fistula was placed 3+ years ago and was recently "cleanedout". [facility]      Past Medical History:  Diagnosis Date  . Allergy   . Anemia   . Arthritis   . Cataract    bil cateracts removed  . Coronary artery disease   . Dementia arising in the senium and presenium ([place])   . Diabetes mellitus    Type 2  . Diverticulitis   . ED (erectile dysfunction)   . Elevated homocysteine ([place])   . ESRD (end stage renal disease) on dialysis ([place]) 03/2015  . GERD (gastroesophageal reflux disease)    pepto   . Gout   . Hyperlipidemia   . Hypertension   .  Hypothyroidism   . Pneumonia   . PVD (peripheral vascular disease) ([place])    has plastic aorta  . Seasonal allergies   . Sleep apnea    does not wear c-pap    Past Surgical History:  Procedure Laterality Date  . aortobifemoral bypass    . AV FISTULA PLACEMENT Left 12/01/2013   Procedure: ARTERIOVENOUS (AV) FISTULA CREATION- LEFT BRACHIOCEPHALIC;  Surgeon: Angelia Mould, MD;  Location: [place];  Service:  Vascular;  Laterality: Left;  . [place] TRANSPOSITION Right 07/27/2014   Procedure: BASCILIC VEIN TRANSPOSITION;  Surgeon: Angelia Mould, MD;  Location: [place];  Service: Vascular;  Laterality: Right;  . BREAST SURGERY     left - granulomatous mastitis  . BURR HOLE Bilateral 07/22/2018   Procedure: BILATERAL BURR HOLES;  Surgeon: Kristeen Miss, MD;  Location: [place];  Service: Neurosurgery;  Laterality: Bilateral;  . COLONOSCOPY    . 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 ([place] HX)  1992  . ENTEROSCOPY N/A 06/03/2018   Procedure: ENTEROSCOPY;  Surgeon: Lavena Bullion, DO;  Location: [place];  Service: Gastroenterology;  Laterality: N/A;  . EYE SURGERY Bilateral    cataracts  . HEMODIALYSIS INPATIENT  01/17/2018      . HOT HEMOSTASIS N/A 06/03/2018   Procedure: HOT HEMOSTASIS (ARGON PLASMA COAGULATION/BICAP);  Surgeon: Lavena Bullion, DO;  Location: [facility] ENDOSCOPY;  Service: Gastroenterology;  Laterality: N/A;  . REVISON OF ARTERIOVENOUS FISTULA Left 02/09/2014   Procedure: REVISON OF LEFT ARTERIOVENOUS FISTULA - RESECTION OF RENDUNDANT VEIN;  Surgeon: Angelia Mould, MD;  Location: [place];  Service: Vascular;  Laterality: Left;  . SBO with lysis adhesions    . SHUNTOGRAM Left 04/19/2014   Procedure: FISTULOGRAM;  Surgeon: Angelia Mould, MD;  Location[facility] C[facility] LAB;  Service: Cardiovascular;  Laterality: Left;  . UNILATERAL UPPER EXTREMEITY ANGIOGRAM N/A 07/12/2014   Procedure: UNILATERAL UPPER Anselmo Rod;  Surgeon: Angelia Mould, MD;  Location: [facility] CATH LAB;  Service: Cardiovascular;  Laterality: N/A;    Prior to Admission medications   Medication Sig Start Date End Date Taking? Authorizing Provider  acetaminophen (TYLENOL) 325 MG tablet Take 1-2 tablets (325-650 mg total) by mouth every 4 (four) hours as needed for mild pain. 08/01/18   Love, Ivan Anchors, PA-C  allopurinol (ZYLOPRIM) 100 MG tablet Take 100 mg by mouth daily as needed (pain).      [provider]  Blood Glucose Monitoring Suppl (FREESTYLE FREEDOM LITE) w/Device KIT Use to check blood sugar 2 times per day dx code E11.65 10/21/18   Elayne Snare, MD  Carboxymethylcellulose Sod PF 0.25 % SOLN Place 1 drop into both eyes as needed (dry eyes).    [provider]  cinacalcet (SENSIPAR) 30 MG tablet Take 1 tablet (30 mg total) by mouth daily with supper. 08/13/18   Love, Ivan Anchors, PA-C  clobetasol (TEMOVATE) 0.05 % external solution Apply 1 application topically 2 (two) times daily as needed (irritation). Apply to scalp 04/19/15   [provider]  denosumab (PROLIA) 60 MG/ML SOSY injection Inject 60 mg into the skin every 6 (six) months.    [provider]  docusate sodium (COLACE) 100 MG capsule Take 1 capsule (100 mg total) by mouth daily. 08/13/18   Love, Ivan Anchors, PA-C  FLUOCINOLONE ACETONIDE SCALP 0.01 % OIL Apply 1 application topically at bedtime. Apply to scalp 04/28/15   [provider]  fluticasone (CUTIVATE) 0.05 % cream Apply 1 application topically 2 (two)  times daily as needed (irritation). Apply to face 04/19/15   [provider]  fluticasone (FLONASE) 50 MCG/ACT nasal spray Place 1 spray into both nostrils daily. Patient taking differently: Place 1 spray into both nostrils daily as needed for allergies.  10/08/14   Mikhail, Velta Addison, DO  glucose blood (FREESTYLE LITE) test strip Use as instructed to check blood sugar 3 times daily. 04/22/18   Elayne Snare, MD  ketoconazole (NIZORAL) 2 % shampoo Apply 1 application topically 2 (two) times a week. Patient taking differently: Apply 1 application topically at bedtime.  03/31/15   Elby Showers, MD  Lancets (FREESTYLE) lancets Use as instructed to check blood sugar 2 times per day dx code E11.65 02/22/16   Elayne Snare, MD  levothyroxine (SYNTHROID, LEVOTHROID) 50 MCG tablet Take 1 tablet (50 mcg total) by mouth daily. 08/06/14   Elby Showers, MD  linagliptin (TRADJENTA) 5 MG TABS  tablet Take 1 tablet (5 mg total) by mouth daily. 10/30/18   Elayne Snare, MD  memantine (NAMENDA) 10 MG tablet Take 1 tablet (10 mg total) by mouth 2 (two) times daily. 09/15/18   Dennie Bible, NP  midodrine (PROAMATINE) 10 MG tablet Take one pill prior to hemodialysis on MWF 08/13/18   Love, Ivan Anchors, PA-C  Multiple Vitamins-Minerals (MULTIVITAMIN WITH MINERALS) tablet Take 1 tablet by mouth daily.    [provider]  Naftifine HCl (NAFTIN) 2 % CREA Apply to feet daily for fungal infection Patient taking differently: Apply 1 application topically daily as needed (for fungal infection). Apply to feet 09/12/15   Elby Showers, MD  Nutritional Supplements (FEEDING SUPPLEMENT, NEPRO CARB STEADY,) LIQD Take 237 mLs by mouth daily.    [provider]  omeprazole (PRILOSEC) 40 MG capsule Take 1 capsule (40 mg total) by mouth daily. 03/13/18   Esterwood, Amy S, PA-C  polyethylene glycol (MIRALAX / GLYCOLAX) packet Take 17 g by mouth daily. 08/13/18   Love, Ivan Anchors, PA-C  repaglinide (PRANDIN) 0.5 MG tablet Take 1 tablet (0.5 mg total) by mouth daily with lunch. 10/22/18   Elayne Snare, MD  senna (SENOKOT) 8.6 MG TABS tablet Take 1 tablet (8.6 mg total) by mouth at bedtime. 08/13/18   Love, Ivan Anchors, PA-C  sevelamer carbonate (RENVELA) 800 MG tablet Take 800 mg by mouth 3 (three) times daily before meals.    [provider]  simvastatin (ZOCOR) 20 MG tablet Take 20 mg by mouth at bedtime.     [provider]  vitamin B-12 (CYANOCOBALAMIN) 1000 MCG tablet Take 1 tablet (1,000 mcg total) by mouth daily. 06/03/18   Patrecia Pour, MD    Current Facility-Administered Medications  Medication Dose Route Frequency Provider Last Rate Last Dose  . 0.9 %  sodium chloride infusion   Intravenous Continuous Lajean Saver, MD      . pantoprazole (PROTONIX) 80 mg in sodium chloride 0.9 % 100 mL IVPB  80 mg Intravenous Once Albrizze, Kaitlyn E, PA-C      . pantoprazole (PROTONIX)  80 mg in sodium chloride 0.9 % 250 mL (0.32 mg/mL) infusion  8 mg/hr Intravenous Continuous Albrizze, Kaitlyn E, PA-C      . [START ON 11/23/2018] pantoprazole (PROTONIX) injection 40 mg  40 mg Intravenous Q12H Albrizze, Kaitlyn E, PA-C       Current Outpatient Medications  Medication Sig Dispense Refill  . acetaminophen (TYLENOL) 325 MG tablet Take 1-2 tablets (325-650 mg total) by mouth every 4 (four) hours as needed for  mild pain.    Marland Kitchen allopurinol (ZYLOPRIM) 100 MG tablet Take 100 mg by mouth daily as needed (pain).     . Blood Glucose Monitoring Suppl (FREESTYLE FREEDOM LITE) w/Device KIT Use to check blood sugar 2 times per day dx code E11.65 1 each 0  . Carboxymethylcellulose Sod PF 0.25 % SOLN Place 1 drop into both eyes as needed (dry eyes).    . cinacalcet (SENSIPAR) 30 MG tablet Take 1 tablet (30 mg total) by mouth daily with supper. 30 tablet 0  . clobetasol (TEMOVATE) 0.05 % external solution Apply 1 application topically 2 (two) times daily as needed (irritation). Apply to scalp  0  . denosumab (PROLIA) 60 MG/ML SOSY injection Inject 60 mg into the skin every 6 (six) months.    . docusate sodium (COLACE) 100 MG capsule Take 1 capsule (100 mg total) by mouth daily. 30 capsule 0  . FLUOCINOLONE ACETONIDE SCALP 0.01 % OIL Apply 1 application topically at bedtime. Apply to scalp  0  . fluticasone (CUTIVATE) 0.05 % cream Apply 1 application topically 2 (two) times daily as needed (irritation). Apply to face  0  . fluticasone (FLONASE) 50 MCG/ACT nasal spray Place 1 spray into both nostrils daily. (Patient taking differently: Place 1 spray into both nostrils daily as needed for allergies. ) 16 g 2  . glucose blood (FREESTYLE LITE) test strip Use as instructed to check blood sugar 3 times daily. 200 each 12  . ketoconazole (NIZORAL) 2 % shampoo Apply 1 application topically 2 (two) times a week. (Patient taking differently: Apply 1 application topically at bedtime. ) 120 mL 0  . Lancets  (FREESTYLE) lancets Use as instructed to check blood sugar 2 times per day dx code E11.65 100 each 3  . levothyroxine (SYNTHROID, LEVOTHROID) 50 MCG tablet Take 1 tablet (50 mcg total) by mouth daily. 90 tablet 1  . linagliptin (TRADJENTA) 5 MG TABS tablet Take 1 tablet (5 mg total) by mouth daily. 90 tablet 3  . memantine (NAMENDA) 10 MG tablet Take 1 tablet (10 mg total) by mouth 2 (two) times daily. 60 tablet 1  . midodrine (PROAMATINE) 10 MG tablet Take one pill prior to hemodialysis on MWF    . Multiple Vitamins-Minerals (MULTIVITAMIN WITH MINERALS) tablet Take 1 tablet by mouth daily.    . Naftifine HCl (NAFTIN) 2 % CREA Apply to feet daily for fungal infection (Patient taking differently: Apply 1 application topically daily as needed (for fungal infection). Apply to feet) 60 g prn  . Nutritional Supplements (FEEDING SUPPLEMENT, NEPRO CARB STEADY,) LIQD Take 237 mLs by mouth daily.    Marland Kitchen omeprazole (PRILOSEC) 40 MG capsule Take 1 capsule (40 mg total) by mouth daily. 30 capsule 0  . polyethylene glycol (MIRALAX / GLYCOLAX) packet Take 17 g by mouth daily. 14 each 0  . repaglinide (PRANDIN) 0.5 MG tablet Take 1 tablet (0.5 mg total) by mouth daily with lunch. 30 tablet 3  . senna (SENOKOT) 8.6 MG TABS tablet Take 1 tablet (8.6 mg total) by mouth at bedtime. 120 each 0  . sevelamer carbonate (RENVELA) 800 MG tablet Take 800 mg by mouth 3 (three) times daily before meals.    . simvastatin (ZOCOR) 20 MG tablet Take 20 mg by mouth at bedtime.     . vitamin B-12 (CYANOCOBALAMIN) 1000 MCG tablet Take 1 tablet (1,000 mcg total) by mouth daily. 30 tablet 0    Allergies as of 11/19/2018 - Review Complete 11/19/2018  Allergen Reaction Noted  .  Penicillins Rash     Family History  Problem Relation Age of Onset  . Aneurysm Mother   . Heart disease Father   . Stroke Father   . Hypertension Father   . Diabetes Father   . Dementia Neg Hx   . Colon cancer Neg Hx   . Esophageal cancer Neg Hx   .  Pancreatic cancer Neg Hx   . Prostate cancer Neg Hx   . Rectal cancer Neg Hx   . Stomach cancer Neg Hx     Social History   Socioeconomic History  . Marital status: Married    Spouse name: [name] Mood  . Number of children: 1  . Years of education: 77  . Highest education level: Not on file  Occupational History  . Occupation: Retired  Scientific laboratory technician  . Financial resource strain: Not on file  . Food insecurity:    Worry: Not on file    Inability: Not on file  . Transportation needs:    Medical: No    Non-medical: No  Tobacco Use  . Smoking status: Former Smoker    Last attempt to quit: 09/24/1994    Years since quitting: 24.1  . Smokeless tobacco: Never Used  Substance and Sexual Activity  . Alcohol use: No    Alcohol/week: 0.0 standard drinks    Comment: Quit Oct. 1977 ("somewhat heavy")  . Drug use: No  . Sexual activity: Not on file  Lifestyle  . Physical activity:    Days per week: 3 days    Minutes per session: 10 min  . Stress: Not at all  Relationships  . Social connections:    Talks on phone: Not on file    Gets together: Not on file    Attends religious service: Not on file    Active member of club or organization: Not on file    Attends meetings of clubs or organizations: Not on file    Relationship status: Not on file  . Intimate partner violence:    Fear of current or ex partner: Not on file    Emotionally abused: Not on file    Physically abused: Not on file    Forced sexual activity: Not on file  Other Topics Concern  . Not on file  Social History Narrative   Lives at home with wife.   Caffeine use: Drinks no soda or tea.    Drinks 1 cup coffee/week   Right handed    Review of Systems: Gen: Denies any fever, chills, sweats, anorexia, fatigue, weakness, malaise, weight loss, and sleep disorder CV: Denies chest pain, angina, palpitations, syncope, orthopnea, PND, peripheral edema, and claudication. Resp: Denies dyspnea at rest, dyspnea with  exercise, cough, sputum, wheezing, coughing up blood, and pleurisy. GI: Denies vomiting blood, jaundice, and fecal incontinence.   Denies dysphagia or odynophagia. GU : Denies urinary burning, blood in urine, urinary frequency, urinary hesitancy, nocturnal urination, and urinary incontinence. MS: Denies joint pain, limitation of movement, and swelling, stiffness, low back pain, extremity pain. Denies muscle weakness, cramps, atrophy.  Derm: Denies rash, itching, dry skin, hives, moles, warts, or unhealing ulcers.  Psych: Denies depression, anxiety, memory loss, suicidal ideation, hallucinations, paranoia, and confusion. Heme: Denies bruising, bleeding, and enlarged lymph nodes. Neuro:  Denies any headaches, dizziness, paresthesias. Endo:  Denies any problems with DM, thyroid, adrenal function.  Physical Exam: Vital signs in last 24 hours: Temp:  [97.5 F (36.4 C)] 97.5 F (36.4 C) (02/26 1145) Pulse Rate:  [71-80] 75 (  02/26 1400) Resp:  [16-18] 18 (02/26 1400) BP: (100-163)/(59-100) 117/100 (02/26 1345) SpO2:  [100 %] 100 % (02/26 1400) Weight:  [61.7 kg] 61.7 kg (02/26 1149)   General:   Alert,  Well-developed, well-nourished, pleasant and cooperative in NAD Eyes:  Sclera clear, no icterus.   Conjunctiva pink. Lungs:  Clear throughout to auscultation.   No wheezes, crackles, or rhonchi.  Heart:  Regular rate and rhythm; no murmurs, clicks, rubs, or gallops. Abdomen:  Soft, mild LLQ tenderness without rebound or guarding, extensive midline abdominal scare with drsg to lower abdominal wound intact.   Rectal:  Deferred  Msk:  Symmetrical without gross deformities. . Pulses:  Normal pulses noted. Extremities:  Without clubbing or edema. Neurologic:  Alert and  oriented x4;  grossly normal neurologically. Skin:  Intact without significant lesions or rashes.. Psych:  Alert and cooperative. Normal mood and affect.  Intake/Output from previous day: No intake/output data  recorded. Intake/Output this shift: No intake/output data recorded.  Lab Results: Recent Labs    11/19/18 1300  WBC 9.7  HGB 8.3*  HCT 27.7*  PLT 138*   BMET Recent Labs    11/19/18 1300  NA 136  K 4.1  CL 96*  CO2 24  GLUCOSE 218*  BUN 66*  CREATININE 7.32*  CALCIUM 8.6*   LFT Recent Labs    11/19/18 1300  PROT 5.9*  ALBUMIN 2.9*  AST 19  ALT 11  ALKPHOS 80  BILITOT 0.1*   No results found.  IMPRESSION/PLAN 29. 78 year old male with GI Bleed, melena, prior hx of GIB 05/2018 secondary to gastric AVMs. check CBC, iron, TIBC, Ferritin and Vitamin B 12 this evening  -Small bowel enteroscopy scheduled 11/20/2018 -monitor H/H closely, transfuse PRBCs for Hg < 8. -full liquid diet, then NPO after midnight.   2. Acute on chronic anemia secondary to ESRD and GI bleeding. New macrocytic anemia. -see plan #1  2. LLQ abdominal pain. History of diverticulosis. ? Hx of diverticulitis -abd/pelvic CT without contrast ordered  -monitor pain, WBC and temperature -pain management per hospitalist  3. ESRD on HD every MWF. Recently requiring higher doses of heparin during dialysis due to his blood was clotting on the dialyzer machine for the past 1 1/2 weeks -recommend nephrology to follow        Noralyn Pick  11/19/2018, 3:13 PM

## 2018-11-19 NOTE — ED Triage Notes (Signed)
Pt reports having black tary stools for the past 2-3 days. Pt reports having a incident previously with this. Pt reports feeling weak as well.

## 2018-11-19 NOTE — ED Notes (Signed)
RN sent 1 visitor back 

## 2018-11-19 NOTE — ED Notes (Signed)
Report to 5 C

## 2018-11-19 NOTE — ED Notes (Signed)
Unable to get blood ?

## 2018-11-19 NOTE — ED Provider Notes (Signed)
[facility] EMERGENCY DEPARTMENT Provider Note   CSN: [id number] Arrival date & time: 11/19/18  1141    History   Chief Complaint Chief Complaint  Patient presents with  . GI Bleeding    HPI [name] is a 78 y.o. 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  Patient states he has had multiple episodes of black tarry stools.  Also has left lower quadrant abdominal pain.  He describes as an ache that is intermittent and denies any radiating or modifying factors.  He rates the pain 7 out of 10 in severity.  He admits to history of similar pain.  Patient admits to generalized weakness.  Patient missed dialysis today. Pt did not take any medications for his pain prior to arrival. Patient had GI bleed in 05/2018.  He reports his diarrhea today is similar to that time.    Past Medical History:  Diagnosis Date  . Allergy   . Anemia   . Arthritis   . Cataract    bil cateracts removed  . Coronary artery disease   . Dementia arising in the senium and presenium ([place])   . Diabetes mellitus    Type 2  . Diverticulitis   . ED (erectile dysfunction)   . Elevated homocysteine ([place])   . ESRD (end stage renal disease) on dialysis ([place]) 03/2015  . GERD (gastroesophageal reflux disease)    pepto   . Gout   . Hyperlipidemia   . Hypertension   . Hypothyroidism   . Pneumonia   . PVD (peripheral vascular disease) ([place])    has plastic aorta  . Seasonal allergies   . Sleep apnea    does not wear c-pap    Patient Active Problem List   Diagnosis Date Noted  . Melena 11/19/2018  . Leukocytosis   . Anemia of chronic disease   . TBI (traumatic brain injury) ([place]) 07/29/2018  . 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 Dementia arising in the senium and presenium ([place])   . Acute on chronic intracranial  subdural hematoma (HCC) 07/21/2018  . Multiple fractures of ribs, left side, initial encounter for closed fracture 07/21/2018  . Subdural hematoma ([place]) 07/21/2018  . Angiodysplasia of stomach   . Occult GI bleeding   . GIB (gastrointestinal bleeding) 06/01/2018  . Low back pain 01/17/2018  . Anorexia   . Dyspnea   . Macrocytic anemia 06/04/2016  . Thrombocytopenia ([place]) 06/04/2016  . Exertional dyspnea 06/04/2016  . Arm paresthesia, left 06/04/2016  . Dyspnea on exertion 06/04/2016  . Tenderness of right calf 06/04/2016  . Mild cognitive impairment with memory loss 01/12/2016  . ESRD on dialysis ([place]) 05/17/2015  . Hypoglycemia   . Weakness 02/18/2015  . OSA (obstructive sleep apnea) 02/02/2015  . Hyperkalemia 10/05/2014  . Chronic kidney disease (CKD), stage IV (severe) ([place]) 04/07/2014  . End stage renal disease ([place]) 11/18/2013  . BPH (benign prostatic hyperplasia) 11/22/2012  . Peripheral vascular disease ([place]) 12/24/2011  . Hyperparathyroidism ([place]) 12/24/2011  . Hypothyroidism 12/24/2011  . Allergic rhinitis 12/24/2011  . Erectile dysfunction 12/24/2011  . Insulin dependent diabetes mellitus ([place]) 09/03/2008  . Hyperlipidemia 09/03/2008  . Essential hypertension 08/30/2008  . PANCREATITIS, HX OF 08/30/2008  . RENAL FAILURE, ACUTE, HX OF 08/30/2008  . DIVERTICULOSIS, COLON 06/08/2003    Past Surgical History:  Procedure  Laterality Date  . aortobifemoral bypass    . AV FISTULA PLACEMENT Left 12/01/2013   Procedure: ARTERIOVENOUS (AV) FISTULA CREATION- LEFT BRACHIOCEPHALIC;  Surgeon: Angelia Mould, MD;  Location: [facility];  Service: Vascular;  Laterality: Left;  . [place] TRANSPOSITION Right 07/27/2014   Procedure: BASCILIC VEIN TRANSPOSITION;  Surgeon: Angelia Mould, MD;  Location: [facility];  Service: Vascular;  Laterality: Right;  . BREAST SURGERY     left - granulomatous mastitis  . BURR HOLE Bilateral 07/22/2018   Procedure: BILATERAL BURR HOLES;   Surgeon: Kristeen Miss, MD;  Location: [facility];  Service: Neurosurgery;  Laterality: Bilateral;  . COLONOSCOPY    . 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 ENTEROSCOPY N/A 06/03/2018   Procedure: ENTEROSCOPY;  Surgeon: Lavena Bullion, DO;  Location: [facility];  Service: Gastroenterology;  Laterality: N/A;  . EYE SURGERY Bilateral    cataracts  . HEMODIALYSIS INPATIENT  01/17/2018      . HOT HEMOSTASIS N/A 06/03/2018   Procedure: HOT HEMOSTASIS (ARGON PLASMA COAGULATION/BICAP);  Surgeon: Lavena Bullion, DO;  Location[facility] ENDOSCOPY;  Service: Gastroenterology;  Laterality: N/A;  . REVISON OF ARTERIOVENOUS FISTULA Left 02/09/2014   Procedure: REVISON OF LEFT ARTERIOVENOUS FISTULA - RESECTION OF RENDUNDANT VEIN;  Surgeon: Angelia Mould, MD;  Location: [facility];  Service: Vascular;  Laterality: Left;  . SBO with lysis adhesions    . SHUNTOGRAM Left 04/19/2014   Procedure: FISTULOGRAM;  Surgeon: Angelia Mould, MD;  Location[facility] CATH LAB;  Service: Cardiovascular;  Laterality: Left;  . UNILATERAL UPPER EXTREMEITY ANGIOGRAM N/A 07/12/2014   Procedure: UNILATERAL UPPER Anselmo Rod;  Surgeon: Angelia Mould, MD;  Location: [facility] CATH LAB;  Service: Cardiovascular;  Laterality: N/A;        Home Medications    Prior to Admission medications   Medication Sig Start Date End Date Taking? Authorizing Provider  acetaminophen (TYLENOL) 325 MG tablet Take 1-2 tablets (325-650 mg total) by mouth every 4 (four) hours as needed for mild pain. 08/01/18  Yes Love, Ivan Anchors, PA-C  allopurinol (ZYLOPRIM) 100 MG tablet Take 100 mg by mouth daily.    Yes [provider]  Carboxymethylcellulose Sod PF 0.25 % SOLN Place 1 drop into both eyes as needed (dry eyes).   Yes [provider]  cinacalcet (SENSIPAR) 30 MG tablet Take 1 tablet (30 mg total) by mouth daily with supper. 08/13/18  Yes Love, Ivan Anchors, PA-C  clobetasol (TEMOVATE) 0.05 % external solution  Apply 1 application topically 2 (two) times daily as needed (irritation). Apply to scalp 04/19/15  Yes [provider]  denosumab (PROLIA) 60 MG/ML SOSY injection Inject 60 mg into the skin every 6 (six) months.   Yes [provider]  docusate sodium (COLACE) 100 MG capsule Take 1 capsule (100 mg total) by mouth daily. Patient taking differently: Take 100 mg by mouth daily as needed for mild constipation.  08/13/18  Yes Love, Ivan Anchors, PA-C  FLUOCINOLONE ACETONIDE SCALP 0.01 % OIL Apply 1 application topically at bedtime. Apply to scalp 04/28/15  Yes [provider]  fluticasone (CUTIVATE) 0.05 % cream Apply 1 application topically 2 (two) times daily as needed (irritation). Apply to face 04/19/15  Yes [provider]  fluticasone (FLONASE) 50 MCG/ACT nasal spray Place 1 spray into both nostrils daily. Patient taking differently: Place 1 spray into both nostrils daily as needed for allergies.  10/08/14  Yes Mikhail, Velta Addison, DO  ketoconazole (NIZORAL) 2 % shampoo Apply 1 application topically 2 (two) times a week. Patient taking differently: Apply 1 application topically at bedtime.  03/31/15  Yes Baxley, Cresenciano Lick, MD  levothyroxine (SYNTHROID, LEVOTHROID) 50 MCG tablet Take 1 tablet (50 mcg total) by mouth daily. 08/06/14  Yes Baxley, Cresenciano Lick, MD  linagliptin (TRADJENTA) 5 MG TABS tablet Take 1 tablet (5 mg total) by mouth daily. 10/30/18  Yes Elayne Snare, MD  memantine (NAMENDA) 10 MG tablet Take 1 tablet (10 mg total) by mouth 2 (two) times daily. 09/15/18  Yes Dennie Bible, NP  midodrine (PROAMATINE) 10 MG tablet Take one pill prior to hemodialysis on MWF 08/13/18  Yes Love, Ivan Anchors, PA-C  Naftifine HCl (NAFTIN) 2 % CREA Apply to feet daily for fungal infection Patient taking differently: Apply 1 application topically daily as needed (for fungal infection). Apply to feet 09/12/15  Yes Baxley, Cresenciano Lick, MD  Nutritional Supplements (FEEDING SUPPLEMENT, NEPRO CARB  STEADY,) LIQD Take 237 mLs by mouth daily.   Yes [provider]  omeprazole (PRILOSEC) 40 MG capsule Take 1 capsule (40 mg total) by mouth daily. 03/13/18  Yes Esterwood, Amy S, PA-C  polyethylene glycol (MIRALAX / GLYCOLAX) packet Take 17 g by mouth daily. Patient taking differently: Take 17 g by mouth daily as needed for mild constipation.  08/13/18  Yes Love, Ivan Anchors, PA-C  repaglinide (PRANDIN) 0.5 MG tablet Take 1 tablet (0.5 mg total) by mouth daily with lunch. 10/22/18  Yes Elayne Snare, MD  senna (SENOKOT) 8.6 MG TABS tablet Take 1 tablet (8.6 mg total) by mouth at bedtime. Patient taking differently: Take 1 tablet by mouth daily as needed for moderate constipation.  08/13/18  Yes Love, Ivan Anchors, PA-C  sevelamer carbonate (RENVELA) 800 MG tablet Take 1,600 mg by mouth 3 (three) times daily before meals.    Yes [provider]  simvastatin (ZOCOR) 20 MG tablet Take 20 mg by mouth at bedtime.    Yes [provider]  Blood Glucose Monitoring Suppl (FREESTYLE FREEDOM LITE) w/Device KIT Use to check blood sugar 2 times per day dx code E11.65 10/21/18   Elayne Snare, MD  glucose blood (FREESTYLE LITE) test strip Use as instructed to check blood sugar 3 times daily. 04/22/18   Elayne Snare, MD  Lancets (FREESTYLE) lancets Use as instructed to check blood sugar 2 times per day dx code E11.65 02/22/16   Elayne Snare, MD  vitamin B-12 (CYANOCOBALAMIN) 1000 MCG tablet Take 1 tablet (1,000 mcg total) by mouth daily. Patient not taking: Reported on 11/19/2018 06/03/18   Patrecia Pour, MD    Family History Family History  Problem Relation Age of Onset  . Aneurysm Mother   . Heart disease Father   . Stroke Father   . Hypertension Father   . Diabetes Father   . Dementia Neg Hx   . Colon cancer Neg Hx   . Esophageal cancer Neg Hx   . Pancreatic cancer Neg Hx   . Prostate cancer Neg Hx   . Rectal cancer Neg Hx   . Stomach cancer Neg Hx     Social History Social History    Tobacco Use  . Smoking status: Former Smoker    Last attempt to quit: 09/24/1994    Years since quitting: 24.1  . Smokeless tobacco: Never Used  Substance Use Topics  . Alcohol use: No    Alcohol/week: 0.0 standard drinks    Comment: Quit Oct. 1977 ("somewhat heavy")  .  Drug use: No     Allergies   Penicillins   Review of Systems Review of Systems  Gastrointestinal: Positive for abdominal pain, blood in stool, diarrhea and nausea. Negative for anal bleeding.  All other systems reviewed and are negative.    Physical Exam Updated Vital Signs BP (!) 168/89   Pulse 89   Temp (!) 97.5 F (36.4 C) (Oral)   Resp 20   Ht _0  (1.803 m)   Wt 61.7 kg   SpO2 96%   BMI 18.97 kg/m   Physical Exam Vitals signs and nursing note reviewed.  Constitutional:      Appearance: He is not ill-appearing, toxic-appearing or diaphoretic.  HENT:     Head: Normocephalic and atraumatic.     Nose: Nose normal.     Mouth/Throat:     Mouth: Mucous membranes are moist.     Pharynx: Oropharynx is clear.  Eyes:     General: No scleral icterus.    Extraocular Movements: Extraocular movements intact.     Conjunctiva/sclera: Conjunctivae normal.     Pupils: Pupils are equal, round, and reactive to light.  Neck:     Musculoskeletal: Normal range of motion.  Cardiovascular:     Rate and Rhythm: Normal rate and regular rhythm.     Pulses: Normal pulses.     Heart sounds: Normal heart sounds.  Pulmonary:     Effort: Pulmonary effort is normal.     Breath sounds: Normal breath sounds.  Abdominal:     General: Bowel sounds are normal. There is no distension.     Palpations: Abdomen is soft.     Tenderness: There is abdominal tenderness (LLQ). There is no guarding or rebound.  Genitourinary:    Comments: NT Chaperone present for exam. Digital Rectal Exam reveals sphincter with good tone. No external hemorrhoids. No masses or fissures. Stool color is black with no overt blood. No gross  melena.  Musculoskeletal: Normal range of motion.  Skin:    General: Skin is warm and dry.     Capillary Refill: Capillary refill takes less than 2 seconds.     Coloration: Skin is pale.     Findings: No rash.  Neurological:     Mental Status: He is alert. Mental status is at baseline.     Motor: No weakness.  Psychiatric:        Behavior: Behavior normal.      ED Treatments / Results  Labs (all labs ordered are listed, but only abnormal results are displayed) Labs Reviewed  COMPREHENSIVE METABOLIC PANEL - Abnormal; Notable for the following components:      Result Value   Chloride 96 (*)    Glucose, Bld 218 (*)    BUN 66 (*)    Creatinine, Ser 7.32 (*)    Calcium 8.6 (*)    Total Protein 5.9 (*)    Albumin 2.9 (*)    Total Bilirubin 0.1 (*)    GFR calc non Af Amer 7 (*)    GFR calc Af Amer 8 (*)    Anion gap 16 (*)    All other components within normal limits  CBC - Abnormal; Notable for the following components:   RBC 2.62 (*)    Hemoglobin 8.3 (*)    HCT 27.7 (*)    MCV 105.7 (*)    RDW 15.9 (*)    Platelets 138 (*)    All other components within normal limits  POC OCCULT BLOOD, ED - Abnormal;  Notable for the following components:   Fecal Occult Bld POSITIVE (*)    All other components within normal limits  VITAMIN B12  FOLATE  IRON AND TIBC  FERRITIN  CBC  TYPE AND SCREEN  ABO/RH    EKG None  Radiology No results found.  Procedures Procedures (including critical care time)  Medications Ordered in ED Medications  0.9 %  sodium chloride infusion ( Intravenous Canceled Entry 11/19/18 1557)  pantoprazole (PROTONIX) 80 mg in sodium chloride 0.9 % 250 mL (0.32 mg/mL) infusion (8 mg/hr Intravenous New Bag/Given 11/19/18 1616)  pantoprazole (PROTONIX) injection 40 mg (has no administration in time range)  Darbepoetin Alfa (ARANESP) injection 200 mcg (has no administration in time range)  Chlorhexidine Gluconate Cloth 2 % PADS 6 each (has no  administration in time range)  allopurinol (ZYLOPRIM) tablet 100 mg (has no administration in time range)  midodrine (PROAMATINE) tablet 10 mg (has no administration in time range)  simvastatin (ZOCOR) tablet 20 mg (has no administration in time range)  memantine (NAMENDA) tablet 10 mg (has no administration in time range)  cinacalcet (SENSIPAR) tablet 30 mg (has no administration in time range)  levothyroxine (SYNTHROID, LEVOTHROID) tablet 50 mcg (has no administration in time range)  sevelamer carbonate (RENVELA) tablet 1,600 mg (has no administration in time range)  acetaminophen (TYLENOL) tablet 650 mg (has no administration in time range)    Or  acetaminophen (TYLENOL) suppository 650 mg (has no administration in time range)  ondansetron (ZOFRAN) tablet 4 mg (has no administration in time range)    Or  ondansetron (ZOFRAN) injection 4 mg (has no administration in time range)  zolpidem (AMBIEN) tablet 5 mg (has no administration in time range)  sorbitol 70 % solution 30 mL (has no administration in time range)  docusate sodium (ENEMEEZ) enema 283 mg (has no administration in time range)  camphor-menthol (SARNA) lotion 1 application (has no administration in time range)    And  hydrOXYzine (ATARAX/VISTARIL) tablet 25 mg (has no administration in time range)  calcium carbonate (dosed in mg elemental calcium) suspension 500 mg of elemental calcium (has no administration in time range)  feeding supplement (NEPRO CARB STEADY) liquid 237 mL (has no administration in time range)  insulin aspart (novoLOG) injection 0-9 Units (has no administration in time range)  pantoprazole (PROTONIX) 80 mg in sodium chloride 0.9 % 100 mL IVPB (80 mg Intravenous New Bag/Given 11/19/18 1557)     Initial Impression / Assessment and Plan / ED Course  I have reviewed the triage vital signs and the nursing notes.  Pertinent labs & imaging results that were available during my care of the patient were reviewed  by me and considered in my medical decision making (see chart for details).   Patient is in no acute distress. He presents with melena x3 days and associated weakness. He has a history of GI bleed in 05/2018.  At that time he had received 2 units of blood, endoscopy showed bleeding of the gastric fundus and was repaired with APC. Patient's hemoglobin today is 8.3 and fecal occult blood positive.  Patient did miss dialysis today, his CMP shows potassium of 4.1 and BUN/creatinine of 66/7.32.  Type and screen pending.  He has history of diverticulitis.  He is tender to left lower quadrant on exam, CT abdomen pelvis without contrast also pending.  Opted for non contrast scan given BUN/creatinine of 66/ 7.32 today. Will start patient on IV Protonix. My attending will consult hospitalist for admission as  well as GI and nephrology.  The patient was discussed with and seen by Dr. Venora Maples who agrees with the treatment plan.     Final Clinical Impressions(s) / ED Diagnoses   Final diagnoses:  Melena  Anemia, unspecified type  Left lower quadrant abdominal pain    ED Discharge Orders    None       Flint Melter 11/19/18 [place], Kevin, MD 11/19/18 1734

## 2018-11-19 NOTE — H&P (Signed)
History and Physical    [name] EHO:[id number] DOB: 03/16/1941 DOA: 11/19/2018  PCP: Elby Showers, MD Consultants:  PM&R - Nena Alexander - nephrology; Dwyane Dee - endocrinology;  Bryan Lemma - GI Patient coming from:  Home - lives with wife; [facility]: Wife, [phone]; [phone]  Chief Complaint: GI bleeding  HPI: [name] is a 78 y.o. male with medical history significant of OSA not on CPAP; ESRD on HD; PVD; HTN; HLD; hypothyroidism; DM; dementia; and CAD presenting with melena.  Several days ago, he started having "these weird bowel movements."  They were explosive and black/tarry.  He had 4 episodes yesterday, 5 times the day prior.  No warning ahead of time.  Slight nausea.   He has been having LLQ pain and also mild flank pain on that side.  This is somewhat similar to his prior presentation, but then it had been going on for over a week.    Small bowel endoscopy in 9/19 with small angiodysplasia, treated with argon plasma.  ED Course:  Upper GI bleeding.  H/o AVM s/p argon plasma gun.  GI will see.  Hgb 8.3, near baseline, given Protonix.  Gets MWF HD, did not go today.  Also with LLQ pain, h/o diverticulosis.  Needs CT.   Review of Systems: As per HPI; otherwise review of systems reviewed and negative.   Ambulatory Status:  Ambulates with a walker for distance, a cane otherwise  Past Medical History:  Diagnosis Date  . Allergy   . Anemia   . Arthritis   . Cataract    bil cateracts removed  . Coronary artery disease   . Dementia arising in the senium and presenium ([place])   . Diabetes mellitus    Type 2  . Diverticulitis   . ED (erectile dysfunction)   . Elevated homocysteine ([place])   . ESRD (end stage renal disease) on dialysis ([place]) 03/2015  . GERD (gastroesophageal reflux disease)    pepto   . Gout   . Hyperlipidemia   . Hypertension   . Hypothyroidism   . Pneumonia   . PVD (peripheral vascular disease) ([place])    has plastic aorta  . Seasonal allergies   .  Sleep apnea    does not wear c-pap    Past Surgical History:  Procedure Laterality Date  . aortobifemoral bypass    . AV FISTULA PLACEMENT Left 12/01/2013   Procedure: ARTERIOVENOUS (AV) FISTULA CREATION- LEFT BRACHIOCEPHALIC;  Surgeon: Angelia Mould, MD;  Location: [place];  Service: Vascular;  Laterality: Left;  . [place] TRANSPOSITION Right 07/27/2014   Procedure: BASCILIC VEIN TRANSPOSITION;  Surgeon: Angelia Mould, MD;  Location: [place];  Service: Vascular;  Laterality: Right;  . BREAST SURGERY     left - granulomatous mastitis  . BURR HOLE Bilateral 07/22/2018   Procedure: BILATERAL BURR HOLES;  Surgeon: Kristeen Miss, MD;  Location: [place];  Service: Neurosurgery;  Laterality: Bilateral;  . COLONOSCOPY    . 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 ENTEROSCOPY N/A 06/03/2018   Procedure: ENTEROSCOPY;  Surgeon: Lavena Bullion, DO;  Location: [place];  Service: Gastroenterology;  Laterality: N/A;  . EYE SURGERY Bilateral    cataracts  . HEMODIALYSIS INPATIENT  01/17/2018      . HOT HEMOSTASIS N/A 06/03/2018   Procedure: HOT HEMOSTASIS (ARGON PLASMA COAGULATION/BICAP);  Surgeon: Lavena Bullion, DO;  [facility] ENDOSCOPY;  Service: Gastroenterology;  Laterality: N/A;  . REVISON OF  ARTERIOVENOUS FISTULA Left 02/09/2014   Procedure: REVISON OF LEFT ARTERIOVENOUS FISTULA - RESECTION OF RENDUNDANT VEIN;  Surgeon: Angelia Mould, MD;  Location: [place];  Service: Vascular;  Laterality: Left;  . SBO with lysis adhesions    . SHUNTOGRAM Left 04/19/2014   Procedure: FISTULOGRAM;  Surgeon: Angelia Mould, MD;  Location: [facility] CATH LAB;  Service: Cardiovascular;  Laterality: Left;  . UNILATERAL UPPER EXTREMEITY ANGIOGRAM N/A 07/12/2014   Procedure: UNILATERAL UPPER Anselmo Rod;  Surgeon: Angelia Mould, MD;  Location: [facility] CATH LAB;  Service: Cardiovascular;  Laterality: N/A;    Social History   Socioeconomic History  . Marital  status: Married    Spouse name: [name]  . Number of children: 1  . Years of education: 60  . Highest education level: Not on file  Occupational History  . Occupation: Retired  Scientific laboratory technician  . Financial resource strain: Not on file  . Food insecurity:    Worry: Not on file    Inability: Not on file  . Transportation needs:    Medical: No    Non-medical: No  Tobacco Use  . Smoking status: Former Smoker    Last attempt to quit: 09/24/1994    Years since quitting: 24.1  . Smokeless tobacco: Never Used  Substance and Sexual Activity  . Alcohol use: No    Alcohol/week: 0.0 standard drinks    Comment: Quit Oct. 1977 ("somewhat heavy")  . Drug use: No  . Sexual activity: Not on file  Lifestyle  . Physical activity:    Days per week: 3 days    Minutes per session: 10 min  . Stress: Not at all  Relationships  . Social connections:    Talks on phone: Not on file    Gets together: Not on file    Attends religious service: Not on file    Active member of club or organization: Not on file    Attends meetings of clubs or organizations: Not on file    Relationship status: Not on file  . Intimate partner violence:    Fear of current or ex partner: Not on file    Emotionally abused: Not on file    Physically abused: Not on file    Forced sexual activity: Not on file  Other Topics Concern  . Not on file  Social History Narrative   Lives at home with wife.   Caffeine use: Drinks no soda or tea.    Drinks 1 cup coffee/week   Right handed    Allergies  Allergen Reactions  . Penicillins Rash    Has patient had a PCN reaction causing immediate rash, facial/tongue/throat swelling, SOB or lightheadedness with hypotension: Yes Has patient had a PCN reaction causing severe rash involving mucus membranes or skin necrosis: Yes Has patient had a PCN reaction that required hospitalization: No Has patient had a PCN reaction occurring within the last 10 years: No If all of the above answers  are "NO", then may proceed with Cephalosporin use.     Family History  Problem Relation Age of Onset  . Aneurysm Mother   . Heart disease Father   . Stroke Father   . Hypertension Father   . Diabetes Father   . Dementia Neg Hx   . Colon cancer Neg Hx   . Esophageal cancer Neg Hx   . Pancreatic cancer Neg Hx   . Prostate cancer Neg Hx   . Rectal cancer Neg Hx   . Stomach cancer  Neg Hx     Prior to Admission medications   Medication Sig Start Date End Date Taking? Authorizing Provider  acetaminophen (TYLENOL) 325 MG tablet Take 1-2 tablets (325-650 mg total) by mouth every 4 (four) hours as needed for mild pain. 08/01/18   Love, Ivan Anchors, PA-C  allopurinol (ZYLOPRIM) 100 MG tablet Take 100 mg by mouth daily as needed (pain).     [provider]  Blood Glucose Monitoring Suppl (FREESTYLE FREEDOM LITE) w/Device KIT Use to check blood sugar 2 times per day dx code E11.65 10/21/18   Elayne Snare, MD  Carboxymethylcellulose Sod PF 0.25 % SOLN Place 1 drop into both eyes as needed (dry eyes).    [provider]  cinacalcet (SENSIPAR) 30 MG tablet Take 1 tablet (30 mg total) by mouth daily with supper. 08/13/18   Love, Ivan Anchors, PA-C  clobetasol (TEMOVATE) 0.05 % external solution Apply 1 application topically 2 (two) times daily as needed (irritation). Apply to scalp 04/19/15   [provider]  denosumab (PROLIA) 60 MG/ML SOSY injection Inject 60 mg into the skin every 6 (six) months.    [provider]  docusate sodium (COLACE) 100 MG capsule Take 1 capsule (100 mg total) by mouth daily. 08/13/18   Love, Ivan Anchors, PA-C  FLUOCINOLONE ACETONIDE SCALP 0.01 % OIL Apply 1 application topically at bedtime. Apply to scalp 04/28/15   [provider]  fluticasone (CUTIVATE) 0.05 % cream Apply 1 application topically 2 (two) times daily as needed (irritation). Apply to face 04/19/15   [provider]  fluticasone (FLONASE) 50 MCG/ACT nasal spray Place 1  spray into both nostrils daily. Patient taking differently: Place 1 spray into both nostrils daily as needed for allergies.  10/08/14   Mikhail, Velta Addison, DO  glucose blood (FREESTYLE LITE) test strip Use as instructed to check blood sugar 3 times daily. 04/22/18   Elayne Snare, MD  ketoconazole (NIZORAL) 2 % shampoo Apply 1 application topically 2 (two) times a week. Patient taking differently: Apply 1 application topically at bedtime.  03/31/15   Elby Showers, MD  Lancets (FREESTYLE) lancets Use as instructed to check blood sugar 2 times per day dx code E11.65 02/22/16   Elayne Snare, MD  levothyroxine (SYNTHROID, LEVOTHROID) 50 MCG tablet Take 1 tablet (50 mcg total) by mouth daily. 08/06/14   Elby Showers, MD  linagliptin (TRADJENTA) 5 MG TABS tablet Take 1 tablet (5 mg total) by mouth daily. 10/30/18   Elayne Snare, MD  memantine (NAMENDA) 10 MG tablet Take 1 tablet (10 mg total) by mouth 2 (two) times daily. 09/15/18   Dennie Bible, NP  midodrine (PROAMATINE) 10 MG tablet Take one pill prior to hemodialysis on MWF 08/13/18   Love, Ivan Anchors, PA-C  Multiple Vitamins-Minerals (MULTIVITAMIN WITH MINERALS) tablet Take 1 tablet by mouth daily.    [provider]  Naftifine HCl (NAFTIN) 2 % CREA Apply to feet daily for fungal infection Patient taking differently: Apply 1 application topically daily as needed (for fungal infection). Apply to feet 09/12/15   Elby Showers, MD  Nutritional Supplements (FEEDING SUPPLEMENT, NEPRO CARB STEADY,) LIQD Take 237 mLs by mouth daily.    [provider]  omeprazole (PRILOSEC) 40 MG capsule Take 1 capsule (40 mg total) by mouth daily. 03/13/18   Esterwood, Amy S, PA-C  polyethylene glycol (MIRALAX / GLYCOLAX) packet Take 17 g by mouth daily. 08/13/18   Love, Ivan Anchors, PA-C  repaglinide (PRANDIN) 0.5 MG tablet  Take 1 tablet (0.5 mg total) by mouth daily with lunch. 10/22/18   Elayne Snare, MD  senna (SENOKOT) 8.6 MG TABS tablet Take 1 tablet (8.6  mg total) by mouth at bedtime. 08/13/18   Love, Ivan Anchors, PA-C  sevelamer carbonate (RENVELA) 800 MG tablet Take 800 mg by mouth 3 (three) times daily before meals.    [provider]  simvastatin (ZOCOR) 20 MG tablet Take 20 mg by mouth at bedtime.     [provider]  vitamin B-12 (CYANOCOBALAMIN) 1000 MCG tablet Take 1 tablet (1,000 mcg total) by mouth daily. 06/03/18   Patrecia Pour, MD    Physical Exam: Vitals:   11/19/18 1330 11/19/18 1345 11/19/18 1400 11/19/18 1521  BP: (!) 100/59 (!) 117/100  (!) 168/89  Pulse: 71 80 75 89  Resp: 18 16 18 20   Temp:      TempSrc:      SpO2: 100% 100% 100% 96%  Weight:      Height:         . General:  Appears calm and comfortable and is NAD; he is lying crossways on the bed . Eyes:  PERRL, EOMI, normal lids, iris . ENT:  grossly normal hearing, lips & tongue, mmm; appropriate dentition with dentures on top . Neck:  no LAD, masses or thyromegaly . Cardiovascular:  RRR, no m/r/g. No LE edema.  Marland Kitchen Respiratory:   CTA bilaterally with no wheezes/rales/rhonchi.  Normal respiratory effort. . Abdomen:  soft, NT, ND, NABS.  There is a small healing LLQ abdominal wound with point TTP adjacent to this wound, which continues to drain a small amount of purulent fluid.  This region surrounds a prior surgical scar that is otherwise healed. . Skin:  no rash or induration seen on limited exam . Musculoskeletal:  grossly normal tone BUE/BLE, good ROM, no bony abnormality . Psychiatric:  grossly normal [name] and affect, speech fluent and appropriate, AOx3 . Neurologic:  CN 2-12 grossly intact, moves all extremities in coordinated fashion, sensation intact    Radiological Exams on Admission: No results found.  EKG: not done   Labs on Admission: I have personally reviewed the available labs and imaging studies at the time of the admission.  Pertinent labs:   Glucose 218 BUN 66/Creatinine 7.32/GFR 8 Anion gap 16 Albumin 2.9 WBC  9.7 Hgb 8.3; 8.7 on 08/26/18 Platelets 138 Heme positive  Assessment/Plan Principal Problem:   Melena Active Problems:   Hyperlipidemia   Essential hypertension   Hypothyroidism   End stage renal disease (HCC)   Angiodysplasia of stomach   Diabetes mellitus type 2 in nonobese (HCC)   Anemia of chronic disease   Melena with h/o angiodysplasia of the stomach -Patient is presenting with several days of explosive melanotic stools, similar in presentation to prior when he had an angiodysplasia treated with argon plasma -His Hgb is only mildly decreased from prior, will follow  -The patient is currently hemodynamically stable - will observe for now - GI consulted by ED, will follow up recommendations - NPO after midnight for EGD/small bowel enteroscopy; full liquids for now, as per GI - Start IV pantoprazole drip - Zofran IV for nausea -CT of abdomen ordered, which should allow visualization of abdominal wall wound, as well  ESRD with anemia -Patient on chronic MWF HD -Nephrology prn order set utilized -He does not appear to be volume overloaded or otherwise in need of acute HD but was due to be dialyzed today and so likely needs  HD today or tomorrow -Nephrology, Dr. Jimmy Footman, has consulted -Iron studies ordered by GI  HTN -No longer on HTN medications -Now taking Midodrine pre-HD  DM -Hold Tradjenta, Prandin -Recent A1c 5.4, indicating good control -Cover with sensitive-scale SSI  HLD -Continue Zocor  Hypothyroidism -Recent normal TSH -Continue Synthroid at current dose for now    DVT prophylaxis:  SCDs Code Status:   Full - confirmed with patient/family Family Communication: Wife present throughout evaluation Disposition Plan:  Home once clinically improved Consults called: Nephrology; GI  Admission status: It is my clinical opinion that referral for OBSERVATION is reasonable and necessary in this patient based on the above information provided. The  aforementioned taken together are felt to place the patient at high risk for further clinical deterioration. However it is anticipated that the patient may be medically stable for discharge from the hospital within 24 to 48 hours.    Karmen Bongo MD [facility]   How to contact the [facility] Attending or Consulting provider [place] or covering provider during after hours [place], for this patient?  1. Check the care team in [facility] and look for a) attending/consulting [facility] provider listed and b) the [facility] team listed 2. Log into [email or web address] and use [place]'s universal password to access. If you do not have the password, please contact the hospital operator. 3. Locate the [facility] provider you are looking for under [facility] and page to a number that you can be directly reached. 4. If you still have difficulty reaching the provider, please page the [facility] (Director on Call) for the Hospitalists listed on amion for assistance.   11/19/2018, 5:00 PM

## 2018-11-20 ENCOUNTER — Encounter (HOSPITAL_COMMUNITY): Payer: Self-pay

## 2018-11-20 ENCOUNTER — Ambulatory Visit: Payer: Medicare Other | Admitting: Physician Assistant

## 2018-11-20 ENCOUNTER — Ambulatory Visit: Payer: Medicare Other | Admitting: Endocrinology

## 2018-11-20 ENCOUNTER — Observation Stay (HOSPITAL_COMMUNITY): Payer: Medicare Other | Admitting: Anesthesiology

## 2018-11-20 ENCOUNTER — Encounter (HOSPITAL_COMMUNITY)
Admission: EM | Disposition: A | Discharge status: Home / Self Care | Payer: Self-pay | Source: Home / Self Care | Attending: Internal Medicine

## 2018-11-20 DIAGNOSIS — J309 Allergic rhinitis, unspecified: Secondary | ICD-10-CM | POA: Diagnosis present

## 2018-11-20 DIAGNOSIS — D649 Anemia, unspecified: Secondary | ICD-10-CM | POA: Diagnosis not present

## 2018-11-20 DIAGNOSIS — E119 Type 2 diabetes mellitus without complications: Secondary | ICD-10-CM

## 2018-11-20 DIAGNOSIS — Z681 Body mass index (BMI) 19 or less, adult: Secondary | ICD-10-CM | POA: Diagnosis not present

## 2018-11-20 DIAGNOSIS — E1122 Type 2 diabetes mellitus with diabetic chronic kidney disease: Secondary | ICD-10-CM | POA: Diagnosis present

## 2018-11-20 DIAGNOSIS — I10 Essential (primary) hypertension: Secondary | ICD-10-CM

## 2018-11-20 DIAGNOSIS — E7849 Other hyperlipidemia: Secondary | ICD-10-CM

## 2018-11-20 DIAGNOSIS — I2510 Atherosclerotic heart disease of native coronary artery without angina pectoris: Secondary | ICD-10-CM | POA: Diagnosis present

## 2018-11-20 DIAGNOSIS — K5521 Angiodysplasia of colon with hemorrhage: Secondary | ICD-10-CM | POA: Diagnosis not present

## 2018-11-20 DIAGNOSIS — D631 Anemia in chronic kidney disease: Secondary | ICD-10-CM | POA: Diagnosis not present

## 2018-11-20 DIAGNOSIS — K31811 Angiodysplasia of stomach and duodenum with bleeding: Secondary | ICD-10-CM | POA: Diagnosis not present

## 2018-11-20 DIAGNOSIS — D62 Acute posthemorrhagic anemia: Secondary | ICD-10-CM | POA: Diagnosis not present

## 2018-11-20 DIAGNOSIS — N186 End stage renal disease: Secondary | ICD-10-CM

## 2018-11-20 DIAGNOSIS — E785 Hyperlipidemia, unspecified: Secondary | ICD-10-CM | POA: Diagnosis present

## 2018-11-20 DIAGNOSIS — N2581 Secondary hyperparathyroidism of renal origin: Secondary | ICD-10-CM | POA: Diagnosis not present

## 2018-11-20 DIAGNOSIS — R51 Headache: Secondary | ICD-10-CM | POA: Diagnosis not present

## 2018-11-20 DIAGNOSIS — E038 Other specified hypothyroidism: Secondary | ICD-10-CM | POA: Diagnosis present

## 2018-11-20 DIAGNOSIS — K31819 Angiodysplasia of stomach and duodenum without bleeding: Secondary | ICD-10-CM

## 2018-11-20 DIAGNOSIS — K5520 Angiodysplasia of colon without hemorrhage: Secondary | ICD-10-CM | POA: Diagnosis not present

## 2018-11-20 DIAGNOSIS — D509 Iron deficiency anemia, unspecified: Secondary | ICD-10-CM | POA: Diagnosis not present

## 2018-11-20 DIAGNOSIS — R1032 Left lower quadrant pain: Secondary | ICD-10-CM | POA: Diagnosis present

## 2018-11-20 DIAGNOSIS — K922 Gastrointestinal hemorrhage, unspecified: Secondary | ICD-10-CM | POA: Diagnosis not present

## 2018-11-20 DIAGNOSIS — D638 Anemia in other chronic diseases classified elsewhere: Secondary | ICD-10-CM

## 2018-11-20 DIAGNOSIS — K219 Gastro-esophageal reflux disease without esophagitis: Secondary | ICD-10-CM | POA: Diagnosis present

## 2018-11-20 DIAGNOSIS — I959 Hypotension, unspecified: Secondary | ICD-10-CM | POA: Diagnosis not present

## 2018-11-20 DIAGNOSIS — E1151 Type 2 diabetes mellitus with diabetic peripheral angiopathy without gangrene: Secondary | ICD-10-CM | POA: Diagnosis not present

## 2018-11-20 DIAGNOSIS — R197 Diarrhea, unspecified: Secondary | ICD-10-CM | POA: Diagnosis present

## 2018-11-20 DIAGNOSIS — D539 Nutritional anemia, unspecified: Secondary | ICD-10-CM | POA: Diagnosis present

## 2018-11-20 DIAGNOSIS — Z992 Dependence on renal dialysis: Secondary | ICD-10-CM | POA: Diagnosis not present

## 2018-11-20 DIAGNOSIS — K3189 Other diseases of stomach and duodenum: Secondary | ICD-10-CM | POA: Diagnosis not present

## 2018-11-20 DIAGNOSIS — D696 Thrombocytopenia, unspecified: Secondary | ICD-10-CM | POA: Diagnosis not present

## 2018-11-20 DIAGNOSIS — Q2733 Arteriovenous malformation of digestive system vessel: Secondary | ICD-10-CM | POA: Diagnosis not present

## 2018-11-20 DIAGNOSIS — R531 Weakness: Secondary | ICD-10-CM | POA: Diagnosis present

## 2018-11-20 DIAGNOSIS — I120 Hypertensive chronic kidney disease with stage 5 chronic kidney disease or end stage renal disease: Secondary | ICD-10-CM | POA: Diagnosis not present

## 2018-11-20 DIAGNOSIS — K921 Melena: Secondary | ICD-10-CM | POA: Diagnosis not present

## 2018-11-20 DIAGNOSIS — F0390 Unspecified dementia without behavioral disturbance: Secondary | ICD-10-CM | POA: Diagnosis present

## 2018-11-20 HISTORY — PX: ENTEROSCOPY: SHX5533

## 2018-11-20 HISTORY — PX: HOT HEMOSTASIS: SHX5433

## 2018-11-20 LAB — BASIC METABOLIC PANEL
Anion gap: 14 (ref 5–15)
BUN: 72 mg/dL — ABNORMAL HIGH (ref 8–23)
CO2: 24 mmol/L (ref 22–32)
Calcium: 8.1 mg/dL — ABNORMAL LOW (ref 8.9–10.3)
Chloride: 99 mmol/L (ref 98–111)
Creatinine, Ser: 8.07 mg/dL — ABNORMAL HIGH (ref 0.61–1.24)
GFR calc Af Amer: 7 mL/min — ABNORMAL LOW (ref >60)
GFR calc non Af Amer: 6 mL/min — ABNORMAL LOW (ref >60)
Glucose, Bld: 90 mg/dL (ref 70–99)
Potassium: 4.7 mmol/L (ref 3.5–5.1)
Sodium: 137 mmol/L (ref 135–145)

## 2018-11-20 LAB — CBC
HCT: 26.4 % — ABNORMAL LOW (ref 39.0–52.0)
Hemoglobin: 8.8 g/dL — ABNORMAL LOW (ref 13.0–17.0)
MCH: 32.1 pg (ref 26.0–34.0)
MCHC: 33.3 g/dL (ref 30.0–36.0)
MCV: 96.4 fL (ref 80.0–100.0)
Platelets: 127 10*3/uL — ABNORMAL LOW (ref 150–400)
RBC: 2.74 MIL/uL — ABNORMAL LOW (ref 4.22–5.81)
RDW: 17.9 % — ABNORMAL HIGH (ref 11.5–15.5)
WBC: 7.4 10*3/uL (ref 4.0–10.5)
nRBC: 0 % (ref 0.0–0.2)

## 2018-11-20 LAB — GLUCOSE, CAPILLARY
Glucose-Capillary: 66 mg/dL — ABNORMAL LOW (ref 70–99)
Glucose-Capillary: 134 mg/dL — ABNORMAL HIGH (ref 70–99)
Glucose-Capillary: 112 mg/dL — ABNORMAL HIGH (ref 70–99)
Glucose-Capillary: 131 mg/dL — ABNORMAL HIGH (ref 70–99)
Glucose-Capillary: 99 mg/dL (ref 70–99)

## 2018-11-20 LAB — FOLATE: Folate: 30 ng/mL (ref >5.9)

## 2018-11-20 SURGERY — ENTEROSCOPY
Anesthesia: Monitor Anesthesia Care

## 2018-11-20 MED ORDER — PROPOFOL 10 MG/ML IV BOLUS
INTRAVENOUS | Status: DC | PRN
  Administered 2018-11-20 (×2): 25 mg via INTRAVENOUS
  Administered 2018-11-20: 20 mg via INTRAVENOUS

## 2018-11-20 MED ORDER — DEXTROSE 50 % IV SOLN
INTRAVENOUS | Status: AC
  Filled 2018-11-20: qty 50

## 2018-11-20 MED ORDER — SODIUM CHLORIDE 0.9 % IV SOLN: 100.0000 mL | INTRAVENOUS | Status: DC | PRN

## 2018-11-20 MED ORDER — PANTOPRAZOLE SODIUM 40 MG PO TBEC
40.0000 mg | DELAYED_RELEASE_TABLET | Freq: Every day | ORAL | Status: DC
  Administered 2018-11-20 – 2018-11-21 (×2): 40 mg via ORAL
  Filled 2018-11-20 (×2): qty 1

## 2018-11-20 MED ORDER — DARBEPOETIN ALFA 200 MCG/0.4ML IJ SOSY
PREFILLED_SYRINGE | INTRAMUSCULAR | Status: AC
  Administered 2018-11-20: 200 ug via INTRAVENOUS
  Filled 2018-11-20: qty 0.4

## 2018-11-20 MED ORDER — SPOT INK MARKER SYRINGE KIT
PACK | SUBMUCOSAL | Status: AC
  Filled 2018-11-20: qty 5

## 2018-11-20 MED ORDER — MIDODRINE HCL 5 MG PO TABS
ORAL_TABLET | ORAL | Status: AC
  Filled 2018-11-20: qty 2

## 2018-11-20 MED ORDER — PHENYLEPHRINE 40 MCG/ML (10ML) SYRINGE FOR IV PUSH (FOR BLOOD PRESSURE SUPPORT)
PREFILLED_SYRINGE | INTRAVENOUS | Status: DC | PRN
  Administered 2018-11-20 (×2): 80 ug via INTRAVENOUS

## 2018-11-20 MED ORDER — PROPOFOL 500 MG/50ML IV EMUL
INTRAVENOUS | Status: DC | PRN
  Administered 2018-11-20: 100 ug/kg/min via INTRAVENOUS

## 2018-11-20 MED ORDER — DEXTROSE 50 % IV SOLN
12.5000 g | Freq: Once | INTRAVENOUS | Status: AC
  Administered 2018-11-20: 12.5 g via INTRAVENOUS

## 2018-11-20 MED ORDER — RENA-VITE PO TABS
1.0000 | ORAL_TABLET | Freq: Every day | ORAL | Status: DC
  Administered 2018-11-20: 1 via ORAL
  Filled 2018-11-20: qty 1

## 2018-11-20 MED ORDER — SODIUM CHLORIDE 0.9 % IV SOLN
510.0000 mg | Freq: Once | INTRAVENOUS | Status: AC
  Administered 2018-11-20: 510 mg via INTRAVENOUS
  Filled 2018-11-20: qty 17

## 2018-11-20 MED ORDER — LIDOCAINE 2% (20 MG/ML) 5 ML SYRINGE
INTRAMUSCULAR | Status: DC | PRN
  Administered 2018-11-20: 80 mg via INTRAVENOUS

## 2018-11-20 MED ORDER — FERROUS SULFATE 325 (65 FE) MG PO TABS
325.0000 mg | ORAL_TABLET | Freq: Every day | ORAL | Status: DC
  Administered 2018-11-21: 325 mg via ORAL
  Filled 2018-11-20: qty 1

## 2018-11-20 MED ORDER — PENTAFLUOROPROP-TETRAFLUOROETH EX AERO: 1.0000 "application " | INHALATION_SPRAY | CUTANEOUS | Status: DC | PRN

## 2018-11-20 MED ORDER — LIDOCAINE-PRILOCAINE 2.5-2.5 % EX CREA: 1.0000 "application " | TOPICAL_CREAM | CUTANEOUS | Status: DC | PRN

## 2018-11-20 MED ORDER — HEPARIN SODIUM (PORCINE) 1000 UNIT/ML DIALYSIS: 1000.0000 [IU] | INTRAMUSCULAR | Status: DC | PRN

## 2018-11-20 MED ORDER — ALTEPLASE 2 MG IJ SOLR: 2.0000 mg | Freq: Once | INTRAMUSCULAR | Status: DC | PRN

## 2018-11-20 MED ORDER — SPOT INK MARKER SYRINGE KIT
PACK | SUBMUCOSAL | Status: DC | PRN
  Administered 2018-11-20: 1.5 mL via SUBMUCOSAL

## 2018-11-20 MED ORDER — LIDOCAINE HCL (PF) 1 % IJ SOLN: 5.0000 mL | INTRAMUSCULAR | Status: DC | PRN

## 2018-11-20 NOTE — Progress Notes (Addendum)
Progress Note    [name]  YWV:[id number] DOB: May 22, 1941  DOA: 11/19/2018 PCP: Elby Showers, MD    Brief Narrative:   Chief complaint: Black stools  Medical records reviewed and are as summarized below:  [name] is an 78 y.o. male with pmh of ESRD on HD, PVD, HTN, HLD, hypothyroidism, DM type II, dementia, CAD, and OSA on CPAP; who presented with complaints of melena.  Assessment/Plan:   Principal Problem:   Melena Active Problems:   Hyperlipidemia   Essential hypertension   Hypothyroidism   End stage renal disease (HCC)   Angiodysplasia of stomach   Diabetes mellitus type 2 in nonobese (HCC)   Anemia of chronic disease  1.  Gastrointestinal bleeding secondary to angiectasias with acute blood loss anemia: Patient presented with complaints melena.  Last EGD 9/19 with small angiodysplasia, treated with argon plasma. Hemoglobin dropped from 8.3 down to 7.6 g/dL.  Patient was transfused 1 unit of packed red blood cells with repeat blood count 8.8 g/dL on 2/27.  Dr. Rush Landmark of GI performed EGD 2/27 which revealed 1 angiectasia in the stomach into the duodenum treated with argon plasma coagulation.  Discussed with GI and recommending observation overnight and restarting Protonix. -Discontinue Protonix drip -Protonix 40 mg p.o. daily per GI -Advance diet as tolerated to renal/carb modified -Serial monitoring of blood counts -Transfuse blood products as needed for hemoglobins less than 8. -Appreciate gastroenterology consultative services, ultimately plan for colonoscopy as outpatient at a later date and time   2.  ESRD on HD: Patient on HD M/W/F.  Nephrology was consulted.  Iron studies revealed iron of 21, TIBC 223, ferritin 193, and saturation ratios 9. - ferraheme 510 mg IV x1 dose now, then start ferrous sulfate 325 mg daily  -Hemodialysis per nephrology  3.  Hypotension: Patient no longer on blood pressure antihypertensive medications.  Blood pressures  noted to be 81/51-125/81. -Continue Midodrine prior to HD -May warrant taking medication daily  4.  Diabetes mellitus type 2: Patient on Prandin and Tradjenta.  Last hemoglobin A1c 5.4. -Continue CBGs q. before meals and at bedtime with with sensitive SSI  5.  Hypothyroidism: Last TSH within normal limits at 1.77 on 11/13/2018. -Continue current dose of levothyroxine  6.  Thrombocytopenia: Chronic. -Continue to monitor  7.  Hyperlipidemia -Continue statin  Body mass index is 18.97 kg/m.   Family Communication/Anticipated D/C date and plan/Code Status   DVT prophylaxis: SCDs Code Status: Full Code.  Family Communication: Discussed plan of care with patient's wife over the phone Disposition Plan: Likely discharge home tomorrow a.m. if hemoglobin stable   Medical Consultants:    Gastroenterology   Anti-Infectives:    None  Subjective:   Patient reports that he feels sleepy and tired.  Discussed with patient's wife over the phone who had already discussed plan with gastroenterology and notes that they will plan for colonoscopy at later date and time as an outpatient and monitor him overnight to make sure blood counts stay stable.  His normal schedule for hemodialysis is Monday, Wednesday, Friday, but he did not receive it yesterday.  Objective:    Vitals:   11/20/18 0945 11/20/18 0953 11/20/18 0957 11/20/18 1004  BP: (!) 81/51 (!) 91/49 125/81 124/65  Pulse: 69 75 74 74  Resp: 15 13 20 20   Temp: 97.6 F (36.4 C)     TempSrc: Oral     SpO2: 96% 100% 100% 98%  Weight:  Height:        Intake/Output Summary (Last 24 hours) at 11/20/2018 1118 Last data filed at 11/20/2018 0309 Gross per 24 hour  Intake 531.17 ml  Output -  Net 531.17 ml   Filed Weights   11/19/18 1149  Weight: 61.7 kg    Exam: Constitutional: Elderly male NAD, calm, comfortable Eyes: PERRL, lids and conjunctivae normal ENMT: Mucous membranes are moist. Posterior pharynx clear of any  exudate or lesions. Neck: normal, supple, no masses, no thyromegaly Respiratory: clear to auscultation bilaterally, no wheezing, no crackles. Normal respiratory effort. No accessory muscle use.  Cardiovascular: Regular rate and rhythm, no murmurs / rubs / gallops. No extremity edema. 2+ pedal pulses. No carotid bruits.  Abdomen: no tenderness, no masses palpated. No hepatosplenomegaly. Bowel sounds positive.  Musculoskeletal: no clubbing / cyanosis. No joint deformity upper and lower extremities. Good ROM, no contractures. Normal muscle tone.  Skin: no rashes, lesions, ulcers. No induration Neurologic: CN 2-12 grossly intact. Sensation intact, DTR normal. Strength 5/5 in all 4.  Psychiatric: Normal judgment and insight. Alert and oriented x 2    Data Reviewed:   I have personally reviewed following labs and imaging studies:  Labs: Labs show the following:   Basic Metabolic Panel: Recent Labs  Lab 11/19/18 1300 11/20/18 0628  NA 136 137  K 4.1 4.7  CL 96* 99  CO2 24 24  GLUCOSE 218* 90  BUN 66* 72*  CREATININE 7.32* 8.07*  CALCIUM 8.6* 8.1*   GFR Estimated Creatinine Clearance: 6.7 mL/min (A) (by C-G formula based on SCr of 8.07 mg/dL (H)). Liver Function Tests: Recent Labs  Lab 11/19/18 1300  AST 19  ALT 11  ALKPHOS 80  BILITOT 0.1*  PROT 5.9*  ALBUMIN 2.9*   No results for input(s): LIPASE, AMYLASE in the last 168 hours. No results for input(s): AMMONIA in the last 168 hours. Coagulation profile No results for input(s): INR, PROTIME in the last 168 hours.  CBC: Recent Labs  Lab 11/19/18 1300 11/19/18 2140 11/20/18 0628  WBC 9.7 7.1 7.4  HGB 8.3* 7.6* 8.8*  HCT 27.7* 23.7* 26.4*  MCV 105.7* 100.9* 96.4  PLT 138* 129* 127*   Cardiac Enzymes: No results for input(s): CKTOTAL, CKMB, CKMBINDEX, TROPONINI in the last 168 hours. BNP (last 3 results) No results for input(s): PROBNP in the last 8760 hours. CBG: Recent Labs  Lab 11/19/18 1816 11/19/18 2107  11/20/18 0625 11/20/18 0654  GLUCAP 96 130* 66* 134*   D-Dimer: No results for input(s): DDIMER in the last 72 hours. Hgb A1c: No results for input(s): HGBA1C in the last 72 hours. Lipid Profile: No results for input(s): CHOL, HDL, LDLCALC, TRIG, CHOLHDL, LDLDIRECT in the last 72 hours. Thyroid function studies: No results for input(s): TSH, T4TOTAL, T3FREE, THYROIDAB in the last 72 hours.  Invalid input(s): FREET3 Anemia work up: Recent Labs    11/19/18 2140  VITAMINB12 445  FOLATE 30.0  FERRITIN 193  TIBC 223*  IRON 21*   Sepsis Labs: Recent Labs  Lab 11/19/18 1300 11/19/18 2140 11/20/18 0628  WBC 9.7 7.1 7.4    Microbiology No results found for this or any previous visit (from the past 240 hour(s)).  Procedures and diagnostic studies:  EGD performed by Dr. Rush Landmark 11/20/2017: Impression - No gross lesions in esophagus. - A single spot with bleeding in the stomach.Treated with argon plasma coagulation (APC). - A single non-bleeding angioectasia in the stomach. Treated with argon plasma  coagulation (APC) - Erythematous mucosa  in the greater curvature.  - No other gross lesions in the stomach.  - Two spots with oozing in the duodenum. Treated with argon plasma coagulation (APC). - Normal mucosa was found in the entire examined duodenum otherwise.  - Three non-bleeding angioectasias in the jejunum.Treated with argon plasma coagulation (APC).  - Normal mucosa was found in the jejunum otherwise.    Ct Abdomen Pelvis Wo Contrast  Result Date: 11/19/2018 CLINICAL DATA:  Left lower quadrant and low back pain EXAM: CT ABDOMEN AND PELVIS WITHOUT CONTRAST TECHNIQUE: Multidetector CT imaging of the abdomen and pelvis was performed following the standard protocol without IV contrast. COMPARISON:  06/01/2018 FINDINGS: Lower chest: Indistinctly marginated 6 mm sub solid nodule in the lingula on image 1/4, not appreciably changed from 06/05/2016. Descending thoracic aortic  atherosclerotic calcification. Mild left anterior descending and right coronary artery atherosclerosis. Hepatobiliary: Faint hypodensities in the liver are not appreciably changed from 06/01/2018 and are nonspecific on today's noncontrast assessment. No new lesions are detected in the liver. Mildly contracted gallbladder. Pancreas: Unremarkable Spleen: Unremarkable Adrenals/Urinary Tract: Bilateral renal atrophy. Scattered hypodense exophytic renal lesions are identified, some of these could have mild complexity but Bosniak classification could not be assigned. A 2 mm linear calcification in the left mid kidney on image 93/6 is nonspecific for calculus versus vascular calcification. There is no hydronephrosis or hydroureter. The urinary bladder appears normal. Both adrenal glands appear normal. Stomach/Bowel: Unremarkable Vascular/Lymphatic: Aortoiliac atherosclerotic vascular disease. Aorta bi-iliac stent graft bypassing the native vessels. No pathologic adenopathy. Reproductive: Mostly rounded and punctate calcifications centrally in the prostate gland. Other: No supplemental non-categorized findings. Musculoskeletal: Extraperitoneal ventral hernia mesh. Chronic degenerative subcortical cyst formation along the right acetabulum with severe loss of articular cartilage space in the right hip. Sclerosis in the right twelfth rib likely from an old fracture, but not changed from 06/01/2018. New sclerotic bands in the left seventh and ninth ribs compatible with interval healing fractures, not present on 06/01/2018. Slight worsening of the compression fracture at L1 with roughly similar morphology of the compression fractures at L2 and L3 as well as L5. There is some subtle paraspinal edema around the L1 compression fracture. There is complete collapse of the L1 vertebral body anteriorly with 5 mm posterior bony retropulsion. Posterior spurring and mild posterior bony retropulsion along the adjacent endplate compression  fractures of L2 and L3, with with sacral 8 along the superior endplate L3 compression fracture and also extending into the intervertebral disc space at this level. There is chronic grade 1 anterolisthesis at L4-5 along with chronic 5 mm posterior bony retropulsion along the posterosuperior endplate of L5. There is also disc bulge at the L4-5 level contributing to prominent central narrowing of the thecal sac and mild bilateral foraminal stenosis at this level. IMPRESSION: 1. There is some mild worsening of the collapse of the L1 vertebral level along with mild paraspinal edema. Prior compression fractures at L2, L3, and L5 appear not appreciably changed from 06/01/2018. 2. New healing anterior rib fractures of the left seventh and ninth ribs. 3. A 6 mm sub solid nodule in the lingula is not appreciably changed over the last 2.5 years. Follow up CT of the chest in 1 years time is recommended to exclude low-grade adenocarcinoma. These recommendations are taken from: Recommendations for the Management of Subsolid Pulmonary Nodules Detected at CT: A Statement from the [place], radiology 2013; 266:1, 588-502. 4.  Aortic Atherosclerosis (ICD10-I70.0).  Coronary atherosclerosis. 5. Stable hypodense lesions in the liver,  nonspecific. 6. Bilateral renal atrophy with scattered renal cysts which are unchanged from prior. 7. 2 mm stone versus vascular calcification in the left mid kidney. 8. Aorta bi-iliac stent graft. 9. Asymmetric arthropathy of the right hip with prominent craniocaudad loss of articular cartilage and acetabular subcortical cyst formation similar to prior. 10. Spondylosis and degenerative disc disease at the L4-5 level contributing prominent central narrowing of the thecal sac and mild bilateral foraminal stenosis. Electronically Signed   By: Van Clines M.D.   On: 11/19/2018 19:56    Medications:   . allopurinol  100 mg Oral Daily  . Chlorhexidine Gluconate Cloth  6 each Topical Q0600   . cinacalcet  30 mg Oral Q supper  . darbepoetin (ARANESP) injection - DIALYSIS  200 mcg Intravenous Q Thu-HD  . insulin aspart  0-9 Units Subcutaneous TID WC  . levothyroxine  50 mcg Oral Q0600  . memantine  10 mg Oral BID  . [START ON 11/21/2018] midodrine  10 mg Oral Once per day on Mon Wed Fri  . multivitamin  1 tablet Oral QHS  . pantoprazole  40 mg Oral Daily  . sevelamer carbonate  1,600 mg Oral TID WC  . simvastatin  20 mg Oral QHS   Continuous Infusions: . sodium chloride 20 mL/hr at 11/20/18 0753     LOS: 0 days   Rondell A Smith  [facility]   *Please refer to [email or web address], password [email or web address] to get updated schedule on who will round on this patient, as hospitalists switch teams weekly. If 7PM-7AM, please contact night-coverage at [email or web address], password [email or web address] for any overnight needs.

## 2018-11-20 NOTE — Progress Notes (Signed)
Subjective: Interval History: has no complaint, sleepy after procedure, wants to get colonoscopy done.  Objective: Vital signs in last 24 hours: Temp:  [97.5 F (36.4 C)-98.1 F (36.7 C)] 97.6 F (36.4 C) (02/27 0945) Pulse Rate:  [69-89] 74 (02/27 1004) Resp:  [12-20] 20 (02/27 1004) BP: (81-168)/(47-100) 124/65 (02/27 1004) SpO2:  [96 %-100 %] 98 % (02/27 1004) Weight:  [61.7 kg] 61.7 kg (02/26 1149) Weight change:   Intake/Output from previous day: 02/26 0701 - 02/27 0700 In: 531.2 [I.V.:213.2; Blood:318] Out: -  Intake/Output this shift: No intake/output data recorded.  General appearance: cooperative, no distress, pale and slowed mentation Resp: diminished breath sounds bilaterally Cardio: S1, S2 normal and systolic murmur: systolic ejection 2/6, crescendo and decrescendo at 2nd left intercostal space GI: mild distension , pos bs,  Extremities: avf RUA  Lab Results: Recent Labs    11/19/18 2140 11/20/18 0628  WBC 7.1 7.4  HGB 7.6* 8.8*  HCT 23.7* 26.4*  PLT 129* 127*   BMET:  Recent Labs    11/19/18 1300 11/20/18 0628  NA 136 137  K 4.1 4.7  CL 96* 99  CO2 24 24  GLUCOSE 218* 90  BUN 66* 72*  CREATININE 7.32* 8.07*  CALCIUM 8.6* 8.1*   No results for input(s): PTH in the last 72 hours. Iron Studies:  Recent Labs    11/19/18 2140  IRON 21*  TIBC 223*  FERRITIN 193    Studies/Results: Ct Abdomen Pelvis Wo Contrast  Result Date: 11/19/2018 CLINICAL DATA:  Left lower quadrant and low back pain EXAM: CT ABDOMEN AND PELVIS WITHOUT CONTRAST TECHNIQUE: Multidetector CT imaging of the abdomen and pelvis was performed following the standard protocol without IV contrast. COMPARISON:  06/01/2018 FINDINGS: Lower chest: Indistinctly marginated 6 mm sub solid nodule in the lingula on image 1/4, not appreciably changed from 06/05/2016. Descending thoracic aortic atherosclerotic calcification. Mild left anterior descending and right coronary artery  atherosclerosis. Hepatobiliary: Faint hypodensities in the liver are not appreciably changed from 06/01/2018 and are nonspecific on today's noncontrast assessment. No new lesions are detected in the liver. Mildly contracted gallbladder. Pancreas: Unremarkable Spleen: Unremarkable Adrenals/Urinary Tract: Bilateral renal atrophy. Scattered hypodense exophytic renal lesions are identified, some of these could have mild complexity but Bosniak classification could not be assigned. A 2 mm linear calcification in the left mid kidney on image 93/6 is nonspecific for calculus versus vascular calcification. There is no hydronephrosis or hydroureter. The urinary bladder appears normal. Both adrenal glands appear normal. Stomach/Bowel: Unremarkable Vascular/Lymphatic: Aortoiliac atherosclerotic vascular disease. Aorta bi-iliac stent graft bypassing the native vessels. No pathologic adenopathy. Reproductive: Mostly rounded and punctate calcifications centrally in the prostate gland. Other: No supplemental non-categorized findings. Musculoskeletal: Extraperitoneal ventral hernia mesh. Chronic degenerative subcortical cyst formation along the right acetabulum with severe loss of articular cartilage space in the right hip. Sclerosis in the right twelfth rib likely from an old fracture, but not changed from 06/01/2018. New sclerotic bands in the left seventh and ninth ribs compatible with interval healing fractures, not present on 06/01/2018. Slight worsening of the compression fracture at L1 with roughly similar morphology of the compression fractures at L2 and L3 as well as L5. There is some subtle paraspinal edema around the L1 compression fracture. There is complete collapse of the L1 vertebral body anteriorly with 5 mm posterior bony retropulsion. Posterior spurring and mild posterior bony retropulsion along the adjacent endplate compression fractures of L2 and L3, with with sacral 8 along the superior endplate L3  compression  fracture and also extending into the intervertebral disc space at this level. There is chronic grade 1 anterolisthesis at L4-5 along with chronic 5 mm posterior bony retropulsion along the posterosuperior endplate of L5. There is also disc bulge at the L4-5 level contributing to prominent central narrowing of the thecal sac and mild bilateral foraminal stenosis at this level. IMPRESSION: 1. There is some mild worsening of the collapse of the L1 vertebral level along with mild paraspinal edema. Prior compression fractures at L2, L3, and L5 appear not appreciably changed from 06/01/2018. 2. New healing anterior rib fractures of the left seventh and ninth ribs. 3. A 6 mm sub solid nodule in the lingula is not appreciably changed over the last 2.5 years. Follow up CT of the chest in 1 years time is recommended to exclude low-grade adenocarcinoma. These recommendations are taken from: Recommendations for the Management of Subsolid Pulmonary Nodules Detected at CT: A Statement from the [place], radiology 2013; 266:1, 539-767. 4.  Aortic Atherosclerosis (ICD10-I70.0).  Coronary atherosclerosis. 5. Stable hypodense lesions in the liver, nonspecific. 6. Bilateral renal atrophy with scattered renal cysts which are unchanged from prior. 7. 2 mm stone versus vascular calcification in the left mid kidney. 8. Aorta bi-iliac stent graft. 9. Asymmetric arthropathy of the right hip with prominent craniocaudad loss of articular cartilage and acetabular subcortical cyst formation similar to prior. 10. Spondylosis and degenerative disc disease at the L4-5 level contributing prominent central narrowing of the thecal sac and mild bilateral foraminal stenosis. Electronically Signed   By: Van Clines M.D.   On: 11/19/2018 19:56    I have reviewed the patient's current medications.  Assessment/Plan: 1 ESRD Hd if not d/c 2 Anemia stable, follow HB 3 GIB  ? Colonoscopy 4 PVD 5 HPTH 6 DM controlled P HD, esa,  follow Hb, ? colonoscopy     LOS: 0 days   Jeneen Rinks Onesti Bonfiglio 11/20/2018,11:03 AM

## 2018-11-20 NOTE — Progress Notes (Signed)
Pt transported to ENDO for Enteroscopy.

## 2018-11-20 NOTE — Interval H&P Note (Signed)
History and Physical Interval Note:  11/20/2018 7:40 AM  [name]  has presented today for surgery, with the diagnosis of GI bleeding, melena, hx gastric AVM  The various methods of treatment have been discussed with the patient and family. After consideration of risks, benefits and other options for treatment, the patient has consented to  Procedure(s): ENTEROSCOPY (N/A) as a surgical intervention .  The patient's history has been reviewed, patient examined, no change in status, stable for surgery.  I have reviewed the patient's chart and labs.  Questions were answered to the patient's satisfaction.     [employer]

## 2018-11-20 NOTE — Progress Notes (Signed)
Pt medications not given st scheduled time today due to pt having a procedure in ENDO this AM, then Dialysis beginning at noon.

## 2018-11-20 NOTE — Transfer of Care (Signed)
Immediate Anesthesia Transfer of Care Note  Patient: [name]  Procedure(s) Performed: ENTEROSCOPY (N/A ) HOT HEMOSTASIS (ARGON PLASMA COAGULATION/BICAP) (N/A ) SUBMUCOSAL TATTOO INJECTION  Patient Location: Endoscopy Unit  Anesthesia Type:MAC  Level of Consciousness: drowsy  Airway & Oxygen Therapy: Patient Spontanous Breathing and Patient connected to nasal cannula oxygen  Post-op Assessment: Report given to RN and Post -op Vital signs reviewed and stable  Post vital signs: Reviewed and stable  Last Vitals:  Vitals Value Taken Time  BP    Temp    Pulse    Resp    SpO2      Last Pain:  Vitals:   11/20/18 0737  TempSrc:   PainSc: 0-No pain         Complications: No apparent anesthesia complications

## 2018-11-20 NOTE — Anesthesia Preprocedure Evaluation (Addendum)
Anesthesia Evaluation  Patient identified by MRN, date of birth, ID band Patient awake    Reviewed: Allergy & Precautions, NPO status , Patient's Chart, lab work & pertinent test results  Airway Mallampati: I  TM Distance: >3 FB Neck ROM: Full    Dental no notable dental hx. (+) Partial Lower, Dental Advisory Given, Missing,    Pulmonary sleep apnea , former smoker,    Pulmonary exam normal breath sounds clear to auscultation       Cardiovascular hypertension, + CAD and + Peripheral Vascular Disease (s/p aortobifemoral bypass)  Normal cardiovascular exam Rhythm:Regular Rate:Normal  TTE 2018 EF 55-60%, valves normal   Neuro/Psych PSYCHIATRIC DISORDERS Dementia    GI/Hepatic GERD  ,  Endo/Other  diabetes, Type 2Hypothyroidism   Renal/GU Dialysis and ESRFRenal disease (dialysis MWF, did not have dialysis yesterday, plan to dialyze today)     Musculoskeletal  (+) Arthritis ,   Abdominal   Peds  Hematology  (+) Blood dyscrasia (Hgb 8.8), anemia ,   Anesthesia Other Findings Enteroscopy for GIB, h/o gastric AVM  Reproductive/Obstetrics                           Anesthesia Physical Anesthesia Plan  ASA: III  Anesthesia Plan: MAC   Post-op Pain Management:    Induction: Intravenous  PONV Risk Score and Plan: 1 and Propofol infusion and Treatment may vary due to age or medical condition  Airway Management Planned: Natural Airway  Additional Equipment:   Intra-op Plan:   Post-operative Plan:   Informed Consent: I have reviewed the patients History and Physical, chart, labs and discussed the procedure including the risks, benefits and alternatives for the proposed anesthesia with the patient or authorized representative who has indicated his/her understanding and acceptance.     Dental advisory given  Plan Discussed with: CRNA  Anesthesia Plan Comments:         Anesthesia  Quick Evaluation

## 2018-11-20 NOTE — Anesthesia Procedure Notes (Signed)
Procedure Name: MAC Date/Time: 11/20/2018 9:01 AM Performed by: Freddrick March, MD Pre-anesthesia Checklist: Patient identified, Emergency Drugs available, Suction available and Patient being monitored Patient Re-evaluated:Patient Re-evaluated prior to induction Oxygen Delivery Method: Nasal cannula Preoxygenation: Pre-oxygenation with 100% oxygen

## 2018-11-20 NOTE — Progress Notes (Signed)
Hypoglycemic event this am. CBG was 66. Per protocol I gave d50 12.5 g IV  Due to patient being NPO. Repeat CBG was 134. K. Shorr notified via page.

## 2018-11-20 NOTE — Op Note (Signed)
[facility] Patient Name: [name] Procedure Date : 11/20/2018 MRN: [id number] Attending MD: Justice Britain , MD Date of Birth: 11/26/1940 CSN: [id number] Age: 78 Admit Type: Inpatient Procedure:                Small bowel enteroscopy Indications:              Anemia, Obscure gastrointestinal bleeding,                            Arteriovenous malformation in the stomach Providers:                Justice Britain, MD, Jeanella Cara, RN,                            Cherylynn Ridges, Technician Referring MD:              Medicines:                Monitored Anesthesia Care Complications:            No immediate complications. Estimated Blood Loss:     Estimated blood loss: none. Procedure:                Pre-Anesthesia Assessment:                           - Prior to the procedure, a History and Physical                            was performed, and patient medications and                            allergies were reviewed. The patient's tolerance of                            previous anesthesia was also reviewed. The risks                            and benefits of the procedure and the sedation                            options and risks were discussed with the patient.                            All questions were answered, and informed consent                            was obtained. Prior Anticoagulants: The patient has                            taken no previous anticoagulant or antiplatelet                            agents. ASA Grade Assessment: III - A patient with  severe systemic disease. After reviewing the risks                            and benefits, the patient was deemed in                            satisfactory condition to undergo the procedure.                           After obtaining informed consent, the endoscope was                            passed under direct vision. Throughout the        procedure, the patient's blood pressure, pulse, and                            oxygen saturations were monitored continuously. The                            [id number]-[id number] ([id number]) Olympus pediatric colonscope                            was introduced through the mouth and advanced to                            the mid-jejunum. The small bowel enteroscopy was                            accomplished without difficulty. The patient                            tolerated the procedure. Scope In: Scope Out: Findings:      No gross lesions were noted in the entire esophagus.      A single spot with bleeding and stigmata of recent bleeding was found in       the cardia. Fulguration to ablate the lesion by argon plasma was       successful using gastric settings.      A single small no bleeding angioectasia was found in the gastric fundus.       Fulguration to ablate the lesion to prevent bleeding by argon plasma was       successful using gastric settings.      Patchy mildly erythematous mucosa without bleeding was found on the       greater curvature of the stomach.      No other gross lesions were noted in the entire examined stomach.      Two spots with oozing were found in the duodenal bulb - after lavage       they remained present and still had small ooze. Fulguration to ablate       the lesion by argon plasma was successful using right colon settings.      Normal mucosa was found in the entire duodenum otherwise.      Three angioectasias with no bleeding were found in the proximal jejunum.       Fulguration to ablate the lesion to prevent bleeding by argon plasma  was       successful using right colon settings.      Normal mucosa was found in the proximal jejunum otherwise. Area was       tattooed with an injection of Spot (carbon black) to demarcate the       extent of today's procedure. Impression:               - No gross lesions in esophagus.                           - A single  spot with bleeding in the stomach.                            Treated with argon plasma coagulation (APC).                           - A single non-bleeding angioectasia in the                            stomach. Treated with argon plasma coagulation                            (APC).                           - Erythematous mucosa in the greater curvature.                           - No other gross lesions in the stomach.                           - Two spots with oozing in the duodenum. Treated                            with argon plasma coagulation (APC).                           - Normal mucosa was found in the entire examined                            duodenum otherwise.                           - Three non-bleeding angioectasias in the jejunum.                            Treated with argon plasma coagulation (APC).                           - Normal mucosa was found in the jejunum otherwise.                            Tattooed. Recommendation:           - The patient will be observed post-procedure,  until all discharge criteria are met.                           - Return patient to hospital ward for ongoing care.                           - Trend Hgb/Hct.                           - Continue PPI 40 mg daily.                           - Will discuss with patient and family whether they                            want to pursue an Inpatient colonoscopy to further                            characterize if he is having ongoing bleeding or                            has developed Angioectasias of the colon in the                            last year. If he chooses to not and his Hemogram is                            stable, he can likely be monitored as an outpatient                            and if he manifests recurrent GI blood loss then                            SBE with Colon/VCE may be necessary.                           - The findings and  recommendations were discussed                            with the patient.                           - The findings and recommendations were discussed                            with the patient's family. Procedure Code(s):        --- Professional ---                           [phone], Small intestinal endoscopy, enteroscopy                            beyond second portion of duodenum, not including  ileum; with control of bleeding (eg, injection,                            bipolar cautery, unipolar cautery, laser, heater                            probe, stapler, plasma coagulator)                           44799, Unlisted procedure, small intestine Diagnosis Code(s):        --- Professional ---                           K92.2, Gastrointestinal hemorrhage, unspecified                           K31.819, Angiodysplasia of stomach and duodenum                            without bleeding                           K31.89, Other diseases of stomach and duodenum                           K55.20, Angiodysplasia of colon without hemorrhage                           D64.9, Anemia, unspecified                           Q27.33, Arteriovenous malformation of digestive                            system vessel CPT copyright 2018 [employer]. All rights reserved. The codes documented in this report are preliminary and upon coder review may  be revised to meet current compliance requirements. Justice Britain, MD 11/20/2018 9:49:09 AM Number of Addenda: 0

## 2018-11-21 ENCOUNTER — Other Ambulatory Visit: Payer: Self-pay

## 2018-11-21 DIAGNOSIS — D509 Iron deficiency anemia, unspecified: Secondary | ICD-10-CM

## 2018-11-21 DIAGNOSIS — D649 Anemia, unspecified: Secondary | ICD-10-CM

## 2018-11-21 DIAGNOSIS — K5520 Angiodysplasia of colon without hemorrhage: Secondary | ICD-10-CM

## 2018-11-21 LAB — CBC WITH DIFFERENTIAL/PLATELET
Abs Immature Granulocytes: 0.02 10*3/uL (ref 0.00–0.07)
Basophils Absolute: 0 10*3/uL (ref 0.0–0.1)
Basophils Relative: 1 %
Eosinophils Absolute: 0.2 10*3/uL (ref 0.0–0.5)
Eosinophils Relative: 3 %
HCT: 26.4 % — ABNORMAL LOW (ref 39.0–52.0)
Hemoglobin: 8.6 g/dL — ABNORMAL LOW (ref 13.0–17.0)
Immature Granulocytes: 0 %
Lymphocytes Relative: 19 %
Lymphs Abs: 1.3 10*3/uL (ref 0.7–4.0)
MCH: 31.6 pg (ref 26.0–34.0)
MCHC: 32.6 g/dL (ref 30.0–36.0)
MCV: 97.1 fL (ref 80.0–100.0)
Monocytes Absolute: 0.6 10*3/uL (ref 0.1–1.0)
Monocytes Relative: 8 %
Neutro Abs: 4.5 10*3/uL (ref 1.7–7.7)
Neutrophils Relative %: 69 %
Platelets: 131 10*3/uL — ABNORMAL LOW (ref 150–400)
RBC: 2.72 MIL/uL — ABNORMAL LOW (ref 4.22–5.81)
RDW: 17.4 % — ABNORMAL HIGH (ref 11.5–15.5)
WBC: 6.5 10*3/uL (ref 4.0–10.5)
nRBC: 0 % (ref 0.0–0.2)

## 2018-11-21 LAB — TYPE AND SCREEN
ABO/RH(D): O POS
Antibody Screen: NEGATIVE
Unit division: 0

## 2018-11-21 LAB — BPAM RBC
Blood Product Expiration Date: 202003212359
ISSUE DATE / TIME: 202002270015
Unit Type and Rh: 5100

## 2018-11-21 LAB — GLUCOSE, CAPILLARY
Glucose-Capillary: 117 mg/dL — ABNORMAL HIGH (ref 70–99)
Glucose-Capillary: 94 mg/dL (ref 70–99)

## 2018-11-21 NOTE — Progress Notes (Addendum)
[name] Progress Note   Subjective:  Seen in room. No new complaints - no CP/dyspnea. GI planning colonoscopy as outpatient. Pt wanting to be discharged today - today is his usual HD day, but spoke to his outpatient unit and they can run him tomorrow at 7:20am - RN notified.Sleep meds make him crazy he says.  Objective Vitals:   11/20/18 1609 11/20/18 1725 11/20/18 2143 11/21/18 0405  BP: (!) 100/38 94/60 (!) 110/53 (!) 112/52  Pulse: 82 85 78 80  Resp: 18  18 17   Temp: 98.1 F (36.7 C) 98.4 F (36.9 C) 98.6 F (37 C) 98.4 F (36.9 C)  TempSrc: Oral Oral Oral Oral  SpO2: 99% 94% 96% 100%  Weight: 68.2 kg   74 kg  Height:       Physical Exam General: Frail man, NAD Heart: RRR; 2/6 systolic murmur Lungs: CTAB decreseds bs Abdomen: soft, non-tendermidline bruit Extremities: No LE edema bilat fem bruits Dialysis Access: RUE AVF + bruit  Additional Objective Labs: Basic Metabolic Panel: Recent Labs  Lab 11/19/18 1300 11/20/18 0628 11/21/18 0345  NA 136 137 137  K 4.1 4.7 3.4*  CL 96* 99 100  CO2 24 24 27   GLUCOSE 218* 90 129*  BUN 66* 72* 22  CREATININE 7.32* 8.07* 4.03*  CALCIUM 8.6* 8.1* 7.2*  PHOS  --   --  2.6   Liver Function Tests: Recent Labs  Lab 11/19/18 1300 11/21/18 0345  AST 19  --   ALT 11  --   ALKPHOS 80  --   BILITOT 0.1*  --   PROT 5.9*  --   ALBUMIN 2.9* 2.4*   CBC: Recent Labs  Lab 11/19/18 1300 11/19/18 2140 11/20/18 0628 11/21/18 0345  WBC 9.7 7.1 7.4 6.5  NEUTROABS  --   --   --  4.5  HGB 8.3* 7.6* 8.8* 8.6*  HCT 27.7* 23.7* 26.4* 26.4*  MCV 105.7* 100.9* 96.4 97.1  PLT 138* 129* 127* 131*   CBG: Recent Labs  Lab 11/20/18 0654 11/20/18 1121 11/20/18 1752 11/20/18 2147 11/21/18 0724  GLUCAP 134* 112* 131* 99 117*   Iron Studies:  Recent Labs    11/19/18 2140  IRON 21*  TIBC 223*  FERRITIN 193   @[email or web address] @ Studies/Results: Ct Abdomen Pelvis Wo Contrast  Result Date:  11/19/2018 CLINICAL DATA:  Left lower quadrant and low back pain EXAM: CT ABDOMEN AND PELVIS WITHOUT CONTRAST TECHNIQUE: Multidetector CT imaging of the abdomen and pelvis was performed following the standard protocol without IV contrast. COMPARISON:  06/01/2018 FINDINGS: Lower chest: Indistinctly marginated 6 mm sub solid nodule in the lingula on image 1/4, not appreciably changed from 06/05/2016. Descending thoracic aortic atherosclerotic calcification. Mild left anterior descending and right coronary artery atherosclerosis. Hepatobiliary: Faint hypodensities in the liver are not appreciably changed from 06/01/2018 and are nonspecific on today's noncontrast assessment. No new lesions are detected in the liver. Mildly contracted gallbladder. Pancreas: Unremarkable Spleen: Unremarkable Adrenals/Urinary Tract: Bilateral renal atrophy. Scattered hypodense exophytic renal lesions are identified, some of these could have mild complexity but Bosniak classification could not be assigned. A 2 mm linear calcification in the left mid kidney on image 93/6 is nonspecific for calculus versus vascular calcification. There is no hydronephrosis or hydroureter. The urinary bladder appears normal. Both adrenal glands appear normal. Stomach/Bowel: Unremarkable Vascular/Lymphatic: Aortoiliac atherosclerotic vascular disease. Aorta bi-iliac stent graft bypassing the native vessels. No pathologic adenopathy. Reproductive: Mostly rounded and punctate calcifications centrally in the prostate gland. Other: No  supplemental non-categorized findings. Musculoskeletal: Extraperitoneal ventral hernia mesh. Chronic degenerative subcortical cyst formation along the right acetabulum with severe loss of articular cartilage space in the right hip. Sclerosis in the right twelfth rib likely from an old fracture, but not changed from 06/01/2018. New sclerotic bands in the left seventh and ninth ribs compatible with interval healing fractures, not  present on 06/01/2018. Slight worsening of the compression fracture at L1 with roughly similar morphology of the compression fractures at L2 and L3 as well as L5. There is some subtle paraspinal edema around the L1 compression fracture. There is complete collapse of the L1 vertebral body anteriorly with 5 mm posterior bony retropulsion. Posterior spurring and mild posterior bony retropulsion along the adjacent endplate compression fractures of L2 and L3, with with sacral 8 along the superior endplate L3 compression fracture and also extending into the intervertebral disc space at this level. There is chronic grade 1 anterolisthesis at L4-5 along with chronic 5 mm posterior bony retropulsion along the posterosuperior endplate of L5. There is also disc bulge at the L4-5 level contributing to prominent central narrowing of the thecal sac and mild bilateral foraminal stenosis at this level. IMPRESSION: 1. There is some mild worsening of the collapse of the L1 vertebral level along with mild paraspinal edema. Prior compression fractures at L2, L3, and L5 appear not appreciably changed from 06/01/2018. 2. New healing anterior rib fractures of the left seventh and ninth ribs. 3. A 6 mm sub solid nodule in the lingula is not appreciably changed over the last 2.5 years. Follow up CT of the chest in 1 years time is recommended to exclude low-grade adenocarcinoma. These recommendations are taken from: Recommendations for the Management of Subsolid Pulmonary Nodules Detected at CT: A Statement from the [place], radiology 2013; 266:1, 465-681. 4.  Aortic Atherosclerosis (ICD10-I70.0).  Coronary atherosclerosis. 5. Stable hypodense lesions in the liver, nonspecific. 6. Bilateral renal atrophy with scattered renal cysts which are unchanged from prior. 7. 2 mm stone versus vascular calcification in the left mid kidney. 8. Aorta bi-iliac stent graft. 9. Asymmetric arthropathy of the right hip with prominent craniocaudad  loss of articular cartilage and acetabular subcortical cyst formation similar to prior. 10. Spondylosis and degenerative disc disease at the L4-5 level contributing prominent central narrowing of the thecal sac and mild bilateral foraminal stenosis. Electronically Signed   By: Van Clines M.D.   On: 11/19/2018 19:56   Medications: . sodium chloride Stopped (11/20/18 1040)   . allopurinol  100 mg Oral Daily  . Chlorhexidine Gluconate Cloth  6 each Topical Q0600  . cinacalcet  30 mg Oral Q supper  . darbepoetin (ARANESP) injection - DIALYSIS  200 mcg Intravenous Q Thu-HD  . ferrous sulfate  325 mg Oral Q breakfast  . insulin aspart  0-9 Units Subcutaneous TID WC  . levothyroxine  50 mcg Oral Q0600  . memantine  10 mg Oral BID  . midodrine  10 mg Oral Once per day on Mon Wed Fri  . multivitamin  1 tablet Oral QHS  . pantoprazole  40 mg Oral Daily  . sevelamer carbonate  1,600 mg Oral TID WC  . simvastatin  20 mg Oral QHS    Dialysis Orders: MWF schedule at [place], 400/800, EDW 64.5kg - although not reaching recently, leaving consistently ~66kg, 2K/2.5Ca, AVF, no hep - Venofer 50mg  IV weekly - Mircera 141mcg IV q 2 weeks (last 2/5) - Hgb 10.4 on 2/19.  Assessment/Plan: 1.  Melena: Per Hx and FOBT +. Abd CT without acute findings, s/p EGD 2/27 with several AVMs treated. Plan for colonoscopy as outpatient. 2.  ESRD: Usual MWF schedule - off this week, dialyzed Thurs. Plan is for d/c today with HD tomorrow at his outpt unit. No heparin. 3.  Hypertension/volume: BP controlled, no volume excess - UF as tolerated.off meds, vol controlle 4.  Anemia (ABLA): Hgb 10.4 on 2/19 -> 8.3 -> now 8.6. S/p 1U PRBCs 2/27 and  Aranesp 200 q Thurs.on IV Fe 5.  Metabolic bone disease: Ca ok, Phos pending. Continue home binders. No VDRA at this time (recently held as outpt for ^ Ca) - trend labs.On cinnacalcet 6.  Nutrition: Alb low, encourage ^ protein diet. 7.  Type 2 DM 8.   Dementia 9.  PVD  Veneta Penton, Hershal Coria 11/21/2018, 8:49 AM  [facility] Pager: [phone] I have seen and examined this patient and agree with the plan of care seen, eval, examined, counseled . Avoid sedatives.  Changes made .  Jeneen Rinks Nikala Walsworth 11/21/2018, 9:53 AM

## 2018-11-21 NOTE — Progress Notes (Signed)
Refused breakfast tray

## 2018-11-21 NOTE — Progress Notes (Signed)
[facility] Progress Note  CC:  Melena, anemia   Subjective: He complains of having a headache this morning, nurse to bring him Tylenol. He passed 1 black loose stool at midnight, no further BMs since then. Left lower abdominal discomfort is less. Decreased appetite.    Objective:  S/P small bowel enteroscopy 11/20/2018: No gross lesions were noted in the entire esophagus. Findings: A single spot with bleeding and stigmata of recent bleeding was found in the cardia. Fulguration to ablate the lesion by argon plasma was successful using gastric settings. A single small no bleeding angioectasia was found in the gastric fundus. Fulguration to ablate the lesion to prevent bleeding by argon plasma was successful using gastric settings. Patchy mildly erythematous mucosa without bleeding was found on the greater curvature of the stomach. No other gross lesions were noted in the entire examined stomach. Two spots with oozing were found in the duodenal bulb - after lavage they remained present and still had small ooze. Fulguration to ablate the lesion by argon plasma was successful using right colon settings. Normal mucosa  Three angioectasias with no bleeding were found in the proximal jejunum. Fulguration to ablate the lesion to prevent bleeding by argon plasma was successful using right colon settings. Normal mucosa was found in the proximal jejunum otherwise.     Vital signs in last 24 hours: Temp:  [97.3 F (36.3 C)-98.6 F (37 C)] 98.3 F (36.8 C) (02/28 0943) Pulse Rate:  [69-85] 75 (02/28 0943) Resp:  [17-18] 18 (02/28 0943) BP: (87-133)/(38-92) 133/77 (02/28 0943) SpO2:  [94 %-100 %] 97 % (02/28 0943) Weight:  [68.2 kg-74 kg] 74 kg (02/28 0405) Last BM Date: 11/20/18 General:   Alert,  Well-developed,    in NAD Heart:  Regular rate and rhythm; no murmurs Pulm; lungs clear throughout  Abdomen:  Soft, nondistended, mild LLQ tenderness without rebound or  guarding, + BS x 4 quads. Extensive midline scar intact, drsg to distal scar intact. Extremities:  Without edema. Neurologic:  Alert and  oriented x4;  grossly normal neurologically. Psych:  Alert and cooperative. Normal mood and affect.  Intake/Output from previous day: 02/27 0701 - 02/28 0700 In: 155.7 [I.V.:55.7; IV Piggyback:100] Out: 1069  Intake/Output this shift: No intake/output data recorded.  Lab Results: Recent Labs    11/19/18 2140 11/20/18 0628 11/21/18 0345  WBC 7.1 7.4 6.5  HGB 7.6* 8.8* 8.6*  HCT 23.7* 26.4* 26.4*  PLT 129* 127* 131*   BMET Recent Labs    11/19/18 1300 11/20/18 0628 11/21/18 0345  NA 136 137 137  K 4.1 4.7 3.4*  CL 96* 99 100  CO2 24 24 27   GLUCOSE 218* 90 129*  BUN 66* 72* 22  CREATININE 7.32* 8.07* 4.03*  CALCIUM 8.6* 8.1* 7.2*   LFT Recent Labs    11/19/18 1300 11/21/18 0345  PROT 5.9*  --   ALBUMIN 2.9* 2.4*  AST 19  --   ALT 11  --   ALKPHOS 80  --   BILITOT 0.1*  --    PT/INR No results for input(s): LABPROT, INR in the last 72 hours. Hepatitis Panel No results for input(s): HEPBSAG, HCVAB, HEPAIGM, HEPBIGM in the last 72 hours.  Ct Abdomen Pelvis Wo Contrast  Result Date: 11/19/2018 CLINICAL DATA:  Left lower quadrant and low back pain EXAM: CT ABDOMEN AND PELVIS WITHOUT CONTRAST TECHNIQUE: Multidetector CT imaging of the abdomen and pelvis was performed following the standard protocol without IV contrast. COMPARISON:  06/01/2018 FINDINGS: Lower chest: Indistinctly marginated 6 mm sub solid nodule in the lingula on image 1/4, not appreciably changed from 06/05/2016. Descending thoracic aortic atherosclerotic calcification. Mild left anterior descending and right coronary artery atherosclerosis. Hepatobiliary: Faint hypodensities in the liver are not appreciably changed from 06/01/2018 and are nonspecific on today's noncontrast assessment. No new lesions are detected in the liver. Mildly contracted gallbladder. Pancreas:  Unremarkable Spleen: Unremarkable Adrenals/Urinary Tract: Bilateral renal atrophy. Scattered hypodense exophytic renal lesions are identified, some of these could have mild complexity but Bosniak classification could not be assigned. A 2 mm linear calcification in the left mid kidney on image 93/6 is nonspecific for calculus versus vascular calcification. There is no hydronephrosis or hydroureter. The urinary bladder appears normal. Both adrenal glands appear normal. Stomach/Bowel: Unremarkable Vascular/Lymphatic: Aortoiliac atherosclerotic vascular disease. Aorta bi-iliac stent graft bypassing the native vessels. No pathologic adenopathy. Reproductive: Mostly rounded and punctate calcifications centrally in the prostate gland. Other: No supplemental non-categorized findings. Musculoskeletal: Extraperitoneal ventral hernia mesh. Chronic degenerative subcortical cyst formation along the right acetabulum with severe loss of articular cartilage space in the right hip. Sclerosis in the right twelfth rib likely from an old fracture, but not changed from 06/01/2018. New sclerotic bands in the left seventh and ninth ribs compatible with interval healing fractures, not present on 06/01/2018. Slight worsening of the compression fracture at L1 with roughly similar morphology of the compression fractures at L2 and L3 as well as L5. There is some subtle paraspinal edema around the L1 compression fracture. There is complete collapse of the L1 vertebral body anteriorly with 5 mm posterior bony retropulsion. Posterior spurring and mild posterior bony retropulsion along the adjacent endplate compression fractures of L2 and L3, with with sacral 8 along the superior endplate L3 compression fracture and also extending into the intervertebral disc space at this level. There is chronic grade 1 anterolisthesis at L4-5 along with chronic 5 mm posterior bony retropulsion along the posterosuperior endplate of L5. There is also disc bulge at  the L4-5 level contributing to prominent central narrowing of the thecal sac and mild bilateral foraminal stenosis at this level. IMPRESSION: 1. There is some mild worsening of the collapse of the L1 vertebral level along with mild paraspinal edema. Prior compression fractures at L2, L3, and L5 appear not appreciably changed from 06/01/2018. 2. New healing anterior rib fractures of the left seventh and ninth ribs. 3. A 6 mm sub solid nodule in the lingula is not appreciably changed over the last 2.5 years. Follow up CT of the chest in 1 years time is recommended to exclude low-grade adenocarcinoma. These recommendations are taken from: Recommendations for the Management of Subsolid Pulmonary Nodules Detected at CT: A Statement from the [place], radiology 2013; 266:1, [phone]. 4.  Aortic Atherosclerosis (ICD10-I70.0).  Coronary atherosclerosis. 5. Stable hypodense lesions in the liver, nonspecific. 6. Bilateral renal atrophy with scattered renal cysts which are unchanged from prior. 7. 2 mm stone versus vascular calcification in the left mid kidney. 8. Aorta bi-iliac stent graft. 9. Asymmetric arthropathy of the right hip with prominent craniocaudad loss of articular cartilage and acetabular subcortical cyst formation similar to prior. 10. Spondylosis and degenerative disc disease at the L4-5 level contributing prominent central narrowing of the thecal sac and mild bilateral foraminal stenosis. Electronically Signed   By: Van Clines M.D.   On: 11/19/2018 19:56    Assessment / Plan: 59. 78 year old male with GI Bleed, melena, prior hx of GIB 05/2018 secondary  to gastric AVMs. Small bowel enteroscopy identified a single spot of bleeding in the cardia, a single small nonbleeding angiectasia in the gastric fundus, 2 spots with oozing found to the duodenal bulb, 3 angiectasia's with out bleeding were found in the proximal jejunum, all areas were treated with APC.  Normal mucosa was found in the  proximal jejunum, area was tattooed to demarcate the extent of the procedure. Hg 8.6 down from 8.8. Patient had 1 small black stool at midnight.  -will need Pantoprazole 40mg  po QD -ok to discharge home today per Dr. Rush Landmark  -will need outpatient office visit with Dr. Rush Landmark, to discuss future colonoscopy/VCE -Repeat H/H in 1 week our office will make arrangements  -patient to return to ED if melena recurs  2. 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 LLQ abdominal pain. History of diverticulosis. ? Hx of diverticulitis. Abdominal pain is less. A/P CT did not identify cause of lower abdomina pain.   4. ESRD on HD every MWF        LOS: 1 day   Noralyn Pick  11/21/2018, 11:04 AM

## 2018-11-21 NOTE — Discharge Summary (Signed)
Physician Discharge Summary  [name] FXT:[id number] DOB: [birth year] DOA: 11/19/2018  PCP: Elby Showers, MD  Admit date: 11/19/2018 Discharge date: 11/21/2018  Admitted From: home Disposition:  Home  Recommendations for Outpatient Follow-up:  1. Follow up with GI in 1-2 weeks, Dr. Rush Landmark, to discuss future colonoscopy/VCE as an outpatient 2. Please obtain /CBC in one week   Home Health:no Equipment/Devices:None  Discharge Condition:Stable CODE STATUS:full Diet recommendation: Heart Healthy  Brief/Interim Summary: 78 y.o. male with pmh of ESRD on HD, PVD, HTN, HLD, hypothyroidism, DM type II, dementia, CAD, and OSA on CPAP; who presented with complaints of melena.  Discharge Diagnoses:  Principal Problem:   Melena Active Problems:   Hyperlipidemia   Essential hypertension   Hypothyroidism   End stage renal disease (HCC)   Angiodysplasia of stomach   Diabetes mellitus type 2 in nonobese (HCC)   Anemia of chronic disease Melena due to upper GI bleed secondary to telangiectasia/acute blood loss anemia: Globin dropped from 8.3-7.6 he was transfused 1 unit of packed red blood cells. GI was consulted to perform an EGD on 11/20/2018 which showed telangiectasia which was cauterized. He was continue on oral Protonix and he will follow-up with GI as an outpatient.  His hemoglobin remained stable.  Home on PPI BID.  End-stage renal disease: He dialyzes Monday Wednesday and Friday nephrology was consulted he was given IV iron and will continue Aranesp as an outpatient.  Hypotension: He was continued on midodrine with dialysis.  Diabetes mellitus type 2: No changes made to his medication.  Hypothyroidism: No changes made to his medication.  Chronic thrombocytopenia: Platelets remained stable continue to monitor as an outpatient.   Discharge Instructions  Discharge Instructions    Diet - low sodium heart healthy   Complete by:  As directed    Increase activity  slowly   Complete by:  As directed      Allergies as of 11/21/2018      Reactions   Penicillins Rash   Has patient had a PCN reaction causing immediate rash, facial/tongue/throat swelling, SOB or lightheadedness with hypotension: Yes Has patient had a PCN reaction causing severe rash involving mucus membranes or skin necrosis: Yes Has patient had a PCN reaction that required hospitalization: No Has patient had a PCN reaction occurring within the last 10 years: No If all of the above answers are "NO", then may proceed with Cephalosporin use.      Medication List    TAKE these medications   acetaminophen 325 MG tablet Commonly known as:  TYLENOL Take 1-2 tablets (325-650 mg total) by mouth every 4 (four) hours as needed for mild pain.   allopurinol 100 MG tablet Commonly known as:  ZYLOPRIM Take 100 mg by mouth daily.   Carboxymethylcellulose Sod PF 0.25 % Soln Place 1 drop into both eyes as needed (dry eyes).   cinacalcet 30 MG tablet Commonly known as:  SENSIPAR Take 1 tablet (30 mg total) by mouth daily with supper.   clobetasol 0.05 % external solution Commonly known as:  TEMOVATE Apply 1 application topically 2 (two) times daily as needed (irritation). Apply to scalp   denosumab 60 MG/ML Sosy injection Commonly known as:  PROLIA Inject 60 mg into the skin every 6 (six) months.   docusate sodium 100 MG capsule Commonly known as:  COLACE Take 1 capsule (100 mg total) by mouth daily. What changed:    when to take this  reasons to take this   feeding supplement (NEPRO  CARB STEADY) Liqd Take 237 mLs by mouth daily.   Fluocinolone Acetonide Scalp 0.01 % Oil Apply 1 application topically at bedtime. Apply to scalp   fluticasone 0.05 % cream Commonly known as:  CUTIVATE Apply 1 application topically 2 (two) times daily as needed (irritation). Apply to face   fluticasone 50 MCG/ACT nasal spray Commonly known as:  FLONASE Place 1 spray into both nostrils  daily. What changed:    when to take this  reasons to take this   FREESTYLE FREEDOM LITE w/Device Kit Use to check blood sugar 2 times per day dx code E11.65   freestyle lancets Use as instructed to check blood sugar 2 times per day dx code E11.65   glucose blood test strip Commonly known as:  FREESTYLE LITE Use as instructed to check blood sugar 3 times daily.   ketoconazole 2 % shampoo Commonly known as:  NIZORAL Apply 1 application topically 2 (two) times a week. What changed:  when to take this   levothyroxine 50 MCG tablet Commonly known as:  SYNTHROID, LEVOTHROID Take 1 tablet (50 mcg total) by mouth daily.   linagliptin 5 MG Tabs tablet Commonly known as:  TRADJENTA Take 1 tablet (5 mg total) by mouth daily.   memantine 10 MG tablet Commonly known as:  NAMENDA Take 1 tablet (10 mg total) by mouth 2 (two) times daily.   midodrine 10 MG tablet Commonly known as:  PROAMATINE Take one pill prior to hemodialysis on MWF   Naftifine HCl 2 % Crea Commonly known as:  NAFTIN Apply to feet daily for fungal infection What changed:    how much to take  how to take this  when to take this  reasons to take this  additional instructions   omeprazole 40 MG capsule Commonly known as:  PRILOSEC Take 1 capsule (40 mg total) by mouth daily.   polyethylene glycol packet Commonly known as:  MIRALAX / GLYCOLAX Take 17 g by mouth daily. What changed:    when to take this  reasons to take this   repaglinide 0.5 MG tablet Commonly known as:  PRANDIN Take 1 tablet (0.5 mg total) by mouth daily with lunch.   senna 8.6 MG Tabs tablet Commonly known as:  SENOKOT Take 1 tablet (8.6 mg total) by mouth at bedtime. What changed:    when to take this  reasons to take this   sevelamer carbonate 800 MG tablet Commonly known as:  RENVELA Take 1,600 mg by mouth 3 (three) times daily before meals.   simvastatin 20 MG tablet Commonly known as:  ZOCOR Take 20 mg by  mouth at bedtime.   vitamin B-12 1000 MCG tablet Commonly known as:  CYANOCOBALAMIN Take 1 tablet (1,000 mcg total) by mouth daily.       Allergies  Allergen Reactions  . Penicillins Rash    Has patient had a PCN reaction causing immediate rash, facial/tongue/throat swelling, SOB or lightheadedness with hypotension: Yes Has patient had a PCN reaction causing severe rash involving mucus membranes or skin necrosis: Yes Has patient had a PCN reaction that required hospitalization: No Has patient had a PCN reaction occurring within the last 10 years: No If all of the above answers are "NO", then may proceed with Cephalosporin use.     Consultations:  GI   Procedures/Studies: Ct Abdomen Pelvis Wo Contrast  Result Date: 11/19/2018 CLINICAL DATA:  Left lower quadrant and low back pain EXAM: CT ABDOMEN AND PELVIS WITHOUT CONTRAST TECHNIQUE: Multidetector CT  imaging of the abdomen and pelvis was performed following the standard protocol without IV contrast. COMPARISON:  06/01/2018 FINDINGS: Lower chest: Indistinctly marginated 6 mm sub solid nodule in the lingula on image 1/4, not appreciably changed from 06/05/2016. Descending thoracic aortic atherosclerotic calcification. Mild left anterior descending and right coronary artery atherosclerosis. Hepatobiliary: Faint hypodensities in the liver are not appreciably changed from 06/01/2018 and are nonspecific on today's noncontrast assessment. No new lesions are detected in the liver. Mildly contracted gallbladder. Pancreas: Unremarkable Spleen: Unremarkable Adrenals/Urinary Tract: Bilateral renal atrophy. Scattered hypodense exophytic renal lesions are identified, some of these could have mild complexity but Bosniak classification could not be assigned. A 2 mm linear calcification in the left mid kidney on image 93/6 is nonspecific for calculus versus vascular calcification. There is no hydronephrosis or hydroureter. The urinary bladder appears  normal. Both adrenal glands appear normal. Stomach/Bowel: Unremarkable Vascular/Lymphatic: Aortoiliac atherosclerotic vascular disease. Aorta bi-iliac stent graft bypassing the native vessels. No pathologic adenopathy. Reproductive: Mostly rounded and punctate calcifications centrally in the prostate gland. Other: No supplemental non-categorized findings. Musculoskeletal: Extraperitoneal ventral hernia mesh. Chronic degenerative subcortical cyst formation along the right acetabulum with severe loss of articular cartilage space in the right hip. Sclerosis in the right twelfth rib likely from an old fracture, but not changed from 06/01/2018. New sclerotic bands in the left seventh and ninth ribs compatible with interval healing fractures, not present on 06/01/2018. Slight worsening of the compression fracture at L1 with roughly similar morphology of the compression fractures at L2 and L3 as well as L5. There is some subtle paraspinal edema around the L1 compression fracture. There is complete collapse of the L1 vertebral body anteriorly with 5 mm posterior bony retropulsion. Posterior spurring and mild posterior bony retropulsion along the adjacent endplate compression fractures of L2 and L3, with with sacral 8 along the superior endplate L3 compression fracture and also extending into the intervertebral disc space at this level. There is chronic grade 1 anterolisthesis at L4-5 along with chronic 5 mm posterior bony retropulsion along the posterosuperior endplate of L5. There is also disc bulge at the L4-5 level contributing to prominent central narrowing of the thecal sac and mild bilateral foraminal stenosis at this level. IMPRESSION: 1. There is some mild worsening of the collapse of the L1 vertebral level along with mild paraspinal edema. Prior compression fractures at L2, L3, and L5 appear not appreciably changed from 06/01/2018. 2. New healing anterior rib fractures of the left seventh and ninth ribs. 3. A 6 mm  sub solid nodule in the lingula is not appreciably changed over the last 2.5 years. Follow up CT of the chest in 1 years time is recommended to exclude low-grade adenocarcinoma. These recommendations are taken from: Recommendations for the Management of Subsolid Pulmonary Nodules Detected at CT: A Statement from the [employer], radiology 2013; 266:1, [id number]. 4.  Aortic Atherosclerosis (ICD10-I70.0).  Coronary atherosclerosis. 5. Stable hypodense lesions in the liver, nonspecific. 6. Bilateral renal atrophy with scattered renal cysts which are unchanged from prior. 7. 2 mm stone versus vascular calcification in the left mid kidney. 8. Aorta bi-iliac stent graft. 9. Asymmetric arthropathy of the right hip with prominent craniocaudad loss of articular cartilage and acetabular subcortical cyst formation similar to prior. 10. Spondylosis and degenerative disc disease at the L4-5 level contributing prominent central narrowing of the thecal sac and mild bilateral foraminal stenosis. Electronically Signed   By: Van Clines M.D.   On: 11/19/2018 19:56    Subjective:  No complains  Discharge Exam: Vitals:   11/20/18 2143 11/21/18 0405  BP: (!) 110/53 (!) 112/52  Pulse: 78 80  Resp: 18 17  Temp: 98.6 F (37 C) 98.4 F (36.9 C)  SpO2: 96% 100%     General: Pt is alert, awake, not in acute distress Cardiovascular: RRR, S1/S2 +, no rubs, no gallops Respiratory: CTA bilaterally, no wheezing, no rhonchi Abdominal: Soft, NT, ND, bowel sounds + Extremities: no edema, no cyanosis    The results of significant diagnostics from this hospitalization (including imaging, microbiology, ancillary and laboratory) are listed below for reference.     Microbiology: No results found for this or any previous visit (from the past 240 hour(s)).   Labs: BNP (last 3 results) No results for input(s): BNP in the last 8760 hours. Basic Metabolic Panel: Recent Labs  Lab 11/19/18 1300 11/20/18 0628  11/21/18 0345  NA 136 137 137  K 4.1 4.7 3.4*  CL 96* 99 100  CO2 24 24 27   GLUCOSE 218* 90 129*  BUN 66* 72* 22  CREATININE 7.32* 8.07* 4.03*  CALCIUM 8.6* 8.1* 7.2*  PHOS  --   --  2.6   Liver Function Tests: Recent Labs  Lab 11/19/18 1300 11/21/18 0345  AST 19  --   ALT 11  --   ALKPHOS 80  --   BILITOT 0.1*  --   PROT 5.9*  --   ALBUMIN 2.9* 2.4*   No results for input(s): LIPASE, AMYLASE in the last 168 hours. No results for input(s): AMMONIA in the last 168 hours. CBC: Recent Labs  Lab 11/19/18 1300 11/19/18 2140 11/20/18 0628 11/21/18 0345  WBC 9.7 7.1 7.4 6.5  NEUTROABS  --   --   --  4.5  HGB 8.3* 7.6* 8.8* 8.6*  HCT 27.7* 23.7* 26.4* 26.4*  MCV 105.7* 100.9* 96.4 97.1  PLT 138* 129* 127* 131*   Cardiac Enzymes: No results for input(s): CKTOTAL, CKMB, CKMBINDEX, TROPONINI in the last 168 hours. BNP: Invalid input(s): POCBNP CBG: Recent Labs  Lab 11/20/18 0625 11/20/18 0654 11/20/18 1121 11/20/18 1752 11/20/18 2147  GLUCAP 66* 134* 112* 131* 99   D-Dimer No results for input(s): DDIMER in the last 72 hours. Hgb A1c No results for input(s): HGBA1C in the last 72 hours. Lipid Profile No results for input(s): CHOL, HDL, LDLCALC, TRIG, CHOLHDL, LDLDIRECT in the last 72 hours. Thyroid function studies No results for input(s): TSH, T4TOTAL, T3FREE, THYROIDAB in the last 72 hours.  Invalid input(s): FREET3 Anemia work up Recent Labs    11/19/18 2140  VITAMINB12 445  FOLATE 30.0  FERRITIN 193  TIBC 223*  IRON 21*   Urinalysis    Component Value Date/Time   COLORURINE YELLOW 07/21/2018 1044   APPEARANCEUR CLOUDY (A) 07/21/2018 1044   LABSPEC 1.009 07/21/2018 1044   PHURINE 9.0 (H) 07/21/2018 1044   GLUCOSEU NEGATIVE 07/21/2018 1044   HGBUR NEGATIVE 07/21/2018 1044   BILIRUBINUR NEGATIVE 07/21/2018 1044   BILIRUBINUR neg 02/14/2015 1115   KETONESUR NEGATIVE 07/21/2018 1044   PROTEINUR 100 (A) 07/21/2018 1044   UROBILINOGEN 0.2  02/20/2015 0944   NITRITE NEGATIVE 07/21/2018 1044   LEUKOCYTESUR SMALL (A) 07/21/2018 1044   Sepsis Labs Invalid input(s): PROCALCITONIN,  WBC,  LACTICIDVEN Microbiology No results found for this or any previous visit (from the past 240 hour(s)).   Time coordinating discharge: 40 minutes  SIGNED:   Charlynne Cousins, MD  [facility]

## 2018-11-21 NOTE — Progress Notes (Signed)
Patient confused,attempting several times to get up "to go to his bed". Patient is already on his bed. Attempted to reorient patient multiple times but still insists on wanting to go to his bed. Patient also insists on going to the bathroom but refused to use the bathroom in his room,stating "that's not my bathroom". " I'm going home." Attempted to place him on recliner and bring to the nurse's station but he refused. RN finally able to get patient to lay down on his bed. Will continue to monitor. Gertrude Tarbet, Wonda Cheng, Therapist, sports

## 2018-11-21 NOTE — Progress Notes (Signed)
Johnny, [name] ([id number]) Visit Report for 11/13/2018 Arrival Information Details Patient Name: [name], [name]. Date of Service: 11/13/2018 12:30 PM Medical Record Number: [id number] Patient Account Number: [id number] Date of Birth/Sex: [birth year] (78 y.o. Male) Treating RN: Montey Hora Primary Care Shanee Batch: Tedra Senegal Other Clinician: Referring Prisila Dlouhy: Tedra Senegal Treating Nikayla Madaris/Extender: [name], HOYT Weeks in Treatment: 9 Visit Information History Since Last Visit Added or deleted any medications: No Patient Arrived: Walker Any new allergies or adverse reactions: No Arrival Time: 12:43 Had a fall or experienced change in No Accompanied By: self activities of daily living that may affect Transfer Assistance: None risk of falls: Patient Identification Verified: Yes Signs or symptoms of abuse/neglect since last visito No Secondary Verification Process Completed: Yes Hospitalized since last visit: No Patient Requires Transmission-Based Precautions: No Implantable device outside of the clinic excluding No Patient Has Alerts: No cellular tissue based products placed in the center since last visit: Has Dressing in Place as Prescribed: Yes Pain Present Now: No Electronic Signature(s) Signed: 11/13/2018 4:31:30 PM By: Montey Hora Entered By: Montey Hora on 11/13/2018 12:43:32 Weil, [name] ([id number]) -------------------------------------------------------------------------------- Clinic Level of Care Assessment Details Patient Name: [name]. Date of Service: 11/13/2018 12:30 PM Medical Record Number: [id number] Patient Account Number: [id number] Date of Birth/Sex: [birth year] (78 y.o. Male) Treating RN: Army Melia Primary Care Mica Ramdass: Tedra Senegal Other Clinician: Referring Manly Nestle: Tedra Senegal Treating Hedaya Latendresse/Extender: [name], HOYT Weeks in Treatment: 9 Clinic Level of Care Assessment Items TOOL 4 Quantity Score []  - Use when only an  EandM is performed on FOLLOW-UP visit 0 ASSESSMENTS - Nursing Assessment / Reassessment []  - Reassessment of Co-morbidities (includes updates in patient status) 0 []  - 0 Reassessment of Adherence to Treatment Plan ASSESSMENTS - Wound and Skin Assessment / Reassessment X - Simple Wound Assessment / Reassessment - one wound 1 5 []  - 0 Complex Wound Assessment / Reassessment - multiple wounds []  - 0 Dermatologic / Skin Assessment (not related to wound area) ASSESSMENTS - Focused Assessment []  - Circumferential Edema Measurements - multi extremities 0 []  - 0 Nutritional Assessment / Counseling / Intervention []  - 0 Lower Extremity Assessment (monofilament, tuning fork, pulses) []  - 0 Peripheral Arterial Disease Assessment (using hand held doppler) ASSESSMENTS - Ostomy and/or Continence Assessment and Care []  - Incontinence Assessment and Management 0 []  - 0 Ostomy Care Assessment and Management (repouching, etc.) PROCESS - Coordination of Care X - Simple Patient / Family Education for ongoing care 1 15 []  - 0 Complex (extensive) Patient / Family Education for ongoing care []  - 0 Staff obtains Programmer, systems, Records, Test Results / Process Orders []  - 0 Staff telephones HHA, Nursing Homes / Clarify orders / etc []  - 0 Routine Transfer to another Facility (non-emergent condition) []  - 0 Routine Hospital Admission (non-emergent condition) []  - 0 New Admissions / Biomedical engineer / Ordering NPWT, Apligraf, etc. []  - 0 Emergency Hospital Admission (emergent condition) X- 1 10 Simple Discharge Coordination Sondgeroth, HAYK DIVIS ([id number]) []  - 0 Complex (extensive) Discharge Coordination PROCESS - Special Needs []  - Pediatric / Minor Patient Management 0 []  - 0 Isolation Patient Management []  - 0 Hearing / Language / Visual special needs []  - 0 Assessment of Community assistance (transportation, D/C planning, etc.) []  - 0 Additional assistance / Altered mentation []  -  0 Support Surface(s) Assessment (bed, cushion, seat, etc.) INTERVENTIONS - Wound Cleansing / Measurement X - Simple Wound Cleansing - one wound 1 5 []  - 0 Complex Wound Cleansing -  multiple wounds []  - 0 Wound Imaging (photographs - any number of wounds) []  - 0 Wound Tracing (instead of photographs) X- 1 5 Simple Wound Measurement - one wound []  - 0 Complex Wound Measurement - multiple wounds INTERVENTIONS - Wound Dressings X - Small Wound Dressing one or multiple wounds 1 10 []  - 0 Medium Wound Dressing one or multiple wounds []  - 0 Large Wound Dressing one or multiple wounds []  - 0 Application of Medications - topical []  - 0 Application of Medications - injection INTERVENTIONS - Miscellaneous []  - External ear exam 0 []  - 0 Specimen Collection (cultures, biopsies, blood, body fluids, etc.) []  - 0 Specimen(s) / Culture(s) sent or taken to Lab for analysis []  - 0 Patient Transfer (multiple staff / Civil Service fast streamer / Similar devices) []  - 0 Simple Staple / Suture removal (25 or less) []  - 0 Complex Staple / Suture removal (26 or more) []  - 0 Hypo / Hyperglycemic Management (close monitor of Blood Glucose) []  - 0 Ankle / Brachial Index (ABI) - do not check if billed separately X- 1 5 Vital Signs Fonseca, [name] ([id number]) Has the patient been seen at the hospital within the last three years: Yes Total Score: 55 Level Of Care: New/Established - Level 2 Electronic Signature(s) Signed: 11/14/2018 4:09:31 PM By: Army Melia Entered By: Army Melia on 11/13/2018 13:15:39 Moorer, [name] ([id number]) -------------------------------------------------------------------------------- Encounter Discharge Information Details Patient Name: [name]. Date of Service: 11/13/2018 12:30 PM Medical Record Number: [id number] Patient Account Number: [id number] Date of Birth/Sex: 01/21/1941 (78 y.o. Male) Treating RN: Army Melia Primary Care Fani Rotondo: Tedra Senegal Other  Clinician: Referring Tracey Stewart: Tedra Senegal Treating Trejan Buda/Extender: [name], HOYT Weeks in Treatment: 9 Encounter Discharge Information Items Discharge Condition: Stable Ambulatory Status: Walker Discharge Destination: Home Transportation: Private Auto Accompanied By: self Schedule Follow-up Appointment: Yes Clinical Summary of Care: Electronic Signature(s) Signed: 11/14/2018 4:09:31 PM By: Army Melia Entered By: Army Melia on 11/13/2018 13:16:27 Anastasi, [name] ([id number]) -------------------------------------------------------------------------------- Lower Extremity Assessment Details Patient Name: [name]. Date of Service: 11/13/2018 12:30 PM Medical Record Number: [id number] Patient Account Number: [id number] Date of Birth/Sex: 12/30/1940 (78 y.o. Male) Treating RN: Montey Hora Primary Care Jakara Blatter: Tedra Senegal Other Clinician: Referring Nichoals Heyde: Tedra Senegal Treating Ivie Maese/Extender: [name], HOYT Weeks in Treatment: 9 Electronic Signature(s) Signed: 11/13/2018 4:31:30 PM By: Montey Hora Entered By: Montey Hora on 11/13/2018 12:52:45 Schmale, [name] ([id number]) -------------------------------------------------------------------------------- Multi Wound Chart Details Patient Name: [name]. Date of Service: 11/13/2018 12:30 PM Medical Record Number: [id number] Patient Account Number: [id number] Date of Birth/Sex: 1941/08/07 (78 y.o. Male) Treating RN: Army Melia Primary Care Ehren Berisha: Tedra Senegal Other Clinician: Referring Kaz Auld: Tedra Senegal Treating Jennifr Gaeta/Extender: [name], HOYT Weeks in Treatment: 9 Vital Signs Height(in): 71 Pulse(bpm): 74 Weight(lbs): 146 Blood Pressure(mmHg): 127/76 Body Mass Index(BMI): 20 Temperature(F): 97.5 Respiratory Rate 16 (breaths/min): Photos: [N/A:N/A] Wound Location: Right Abdomen - Lower N/A N/A Quadrant Wounding Event: Blister N/A N/A Primary Etiology: Abscess N/A  N/A Comorbid History: Anemia, Hypertension, N/A N/A Peripheral Venous Disease, Type II Diabetes, End Stage Renal Disease, Gout, Neuropathy Date Acquired: 08/28/2018 N/A N/A Weeks of Treatment: 9 N/A N/A Wound Status: Open N/A N/A Measurements L x W x D 0.1x0.1x0.1 N/A N/A (cm) Area (cm) : 0.008 N/A N/A Volume (cm) : 0.001 N/A N/A % Reduction in Area: 100.00% N/A N/A % Reduction in Volume: 100.00% N/A N/A Classification: Full Thickness Without N/A N/A Exposed Support Structures Exudate Amount: None Present N/A N/A Wound  Margin: Flat and Intact N/A N/A Granulation Amount: None Present (0%) N/A N/A Necrotic Amount: None Present (0%) N/A N/A Exposed Structures: Fat Layer (Subcutaneous N/A N/A Tissue) Exposed: Yes Fascia: No Tendon: No Muscle: No Lacap, [name] ([id number]) Joint: No Bone: No Epithelialization: Large (67-100%) N/A N/A Periwound Skin Texture: Scarring: Yes N/A N/A Excoriation: No Induration: No Callus: No Crepitus: No Rash: No Periwound Skin Moisture: Maceration: No N/A N/A Dry/Scaly: No Periwound Skin Color: Atrophie Blanche: No N/A N/A Cyanosis: No Ecchymosis: No Erythema: No Hemosiderin Staining: No Mottled: No Pallor: No Rubor: No Temperature: No Abnormality N/A N/A Tenderness on Palpation: Yes N/A N/A Wound Preparation: Ulcer Cleansing: N/A N/A Rinsed/Irrigated with Saline Topical Anesthetic Applied: None Treatment Notes Electronic Signature(s) Signed: 11/14/2018 4:09:31 PM By: Army Melia Entered By: Army Melia on 11/13/2018 13:08:28 Homes, [name] ([id number]) -------------------------------------------------------------------------------- [place] Details Patient Name: [name]. Date of Service: 11/13/2018 12:30 PM Medical Record Number: [id number] Patient Account Number: [id number] Date of Birth/Sex: [birth year] (78 y.o. Male) Treating RN: Army Melia Primary Care Elden Brucato: Tedra Senegal Other  Clinician: Referring Mackinzee Roszak: Tedra Senegal Treating Mikeila Burgen/Extender: [name], HOYT Weeks in Treatment: 9 Active Inactive Abuse / Safety / Falls / Self Care Management Nursing Diagnoses: Impaired physical mobility Goals: Patient will remain injury free related to falls Date Initiated: 09/11/2018 Target Resolution Date: 11/29/2018 Goal Status: Active Interventions: Assess fall risk on admission and as needed Notes: Nutrition Nursing Diagnoses: Potential for alteratiion in Nutrition/Potential for imbalanced nutrition Goals: Patient/caregiver agrees to and verbalizes understanding of need to use nutritional supplements and/or vitamins as prescribed Date Initiated: 09/11/2018 Target Resolution Date: 11/29/2018 Goal Status: Active Interventions: Assess patient nutrition upon admission and as needed per policy Notes: Orientation to the Wound Care Program Nursing Diagnoses: Knowledge deficit related to the wound healing center program Goals: Patient/caregiver will verbalize understanding of the [facility] Date Initiated: 09/11/2018 Target Resolution Date: 11/29/2018 Goal Status: Active Interventions: Provide education on orientation to the wound center Hehl, [name] ([id number]) Notes: Wound/Skin Impairment Nursing Diagnoses: Impaired tissue integrity Goals: Ulcer/skin breakdown will heal within 14 weeks Date Initiated: 09/11/2018 Target Resolution Date: 11/29/2018 Goal Status: Active Interventions: Assess patient/caregiver ability to obtain necessary supplies Assess patient/caregiver ability to perform ulcer/skin care regimen upon admission and as needed Assess ulceration(s) every visit Notes: Electronic Signature(s) Signed: 11/14/2018 4:09:31 PM By: Army Melia Entered By: Army Melia on 11/13/2018 13:08:19 Larabee, [name] ([id number]) -------------------------------------------------------------------------------- Pain Assessment Details Patient  Name: [name]. Date of Service: 11/13/2018 12:30 PM Medical Record Number: [id number] Patient Account Number: [id number] Date of Birth/Sex: 10-Jul-1941 (78 y.o. Male) Treating RN: Montey Hora Primary Care Jabez Molner: Tedra Senegal Other Clinician: Referring Maddy Graham: Tedra Senegal Treating Suzana Sohail/Extender: [name], HOYT Weeks in Treatment: 9 Active Problems Location of Pain Severity and Description of Pain Patient Has Paino No Site Locations Pain Management and Medication Current Pain Management: Electronic Signature(s) Signed: 11/13/2018 4:31:30 PM By: Montey Hora Entered By: Montey Hora on 11/13/2018 12:43:40 Shorten, [name] ([id number]) -------------------------------------------------------------------------------- Patient/Caregiver Education Details Patient Name: [name]. Date of Service: 11/13/2018 12:30 PM Medical Record Number: [id number] Patient Account Number: [id number] Date of Birth/Gender: 1940-10-31 (78 y.o. Male) Treating RN: Army Melia Primary Care Physician: Tedra Senegal Other Clinician: Referring Physician: Tedra Senegal Treating Physician/Extender: Sharalyn Ink in Treatment: 9 Education Assessment Education Provided To: Patient Education Topics Provided Wound/Skin Impairment: Handouts: Caring for Your Ulcer Methods: Demonstration, Explain/Verbal Responses: State content correctly Electronic Signature(s) Signed: 11/14/2018 4:09:31 PM By:  Nicki Reaper, Dajea Entered By: Army Melia on 11/13/2018 13:15:55 Steck, [name] ([id number]) -------------------------------------------------------------------------------- Wound Assessment Details Patient Name: AQUILLA, VOILES. Date of Service: 11/13/2018 12:30 PM Medical Record Number: [id number] Patient Account Number: [id number] Date of Birth/Sex: Feb 19, 1941 (78 y.o. Male) Treating RN: Army Melia Primary Care Leeandre Nordling: Tedra Senegal Other Clinician: Referring Miles Leyda: Tedra [place] Treating Lessie Manigo/Extender: [name], HOYT Weeks in Treatment: 9 Wound Status Wound Number: 6 Primary Abscess Etiology: Wound Location: Right Abdomen - Lower Quadrant Wound Healed - Epithelialized Wounding Event: Blister Status: Date Acquired: 08/28/2018 Comorbid Anemia, Hypertension, Peripheral Venous Weeks Of Treatment: 9 History: Disease, Type II Diabetes, End Stage Renal Clustered Wound: No Disease, Gout, Neuropathy Photos Photo Uploaded By: Montey Hora on 11/13/2018 13:04:09 Wound Measurements Length: (cm) 0 % Redu Width: (cm) 0 % Redu Depth: (cm) 0 Epithe Area: (cm) 0 Tunne Volume: (cm) 0 Under ction in Area: 100% ction in Volume: 100% lialization: Large (67-100%) ling: No mining: No Wound Description Full Thickness Without Exposed Support Foul O Classification: Structures Slough Wound Margin: Flat and Intact Exudate None Present Amount: dor After Cleansing: No /Fibrino Yes Wound Bed Granulation Amount: None Present (0%) Exposed Structure Necrotic Amount: None Present (0%) Fascia Exposed: No Fat Layer (Subcutaneous Tissue) Exposed: Yes Tendon Exposed: No Muscle Exposed: No Joint Exposed: No Bone Exposed: No Periwound Skin Texture Kress, [name] ([id number]) Texture Color No Abnormalities Noted: No No Abnormalities Noted: No Callus: No Atrophie Blanche: No Crepitus: No Cyanosis: No Excoriation: No Ecchymosis: No Induration: No Erythema: No Rash: No Hemosiderin Staining: No Scarring: Yes Mottled: No Pallor: No Moisture Rubor: No No Abnormalities Noted: No Dry / Scaly: No Temperature / Pain Maceration: No Temperature: No Abnormality Tenderness on Palpation: Yes Wound Preparation Ulcer Cleansing: Rinsed/Irrigated with Saline Topical Anesthetic Applied: None Electronic Signature(s) Signed: 11/14/2018 4:09:31 PM By: Army Melia Entered By: Army Melia on 11/13/2018 13:12:14 Wakeley, [name]  ([id number]) -------------------------------------------------------------------------------- Vitals Details Patient Name: [name]. Date of Service: 11/13/2018 12:30 PM Medical Record Number: [id number] Patient Account Number: [id number] Date of Birth/Sex: 1941-03-03 (78 y.o. Male) Treating RN: Montey Hora Primary Care Joscelyn Hardrick: Tedra Senegal Other Clinician: Referring Yuko Coventry: Tedra Senegal Treating Davonna Ertl/Extender: [name], HOYT Weeks in Treatment: 9 Vital Signs Time Taken: 12:43 Temperature (F): 97.5 Height (in): 71 Pulse (bpm): 74 Weight (lbs): 146 Respiratory Rate (breaths/min): 16 Body Mass Index (BMI): 20.4 Blood Pressure (mmHg): 127/76 Reference Range: 80 - 120 mg / dl Electronic Signature(s) Signed: 11/13/2018 4:31:30 PM By: Montey Hora Entered By: Montey Hora on 11/13/2018 12:50:12

## 2018-11-21 NOTE — Progress Notes (Signed)
[name] ([id number]) Visit Report for 11/13/2018 Chief Complaint Document Details Patient Name: [name], [name]. Date of Service: 11/13/2018 12:30 PM Medical Record Number: [id number] Patient Account Number: [id number] Date of Birth/Sex: [birth year] (78 y.o. Male) Treating RN: Montey Hora Primary Care Provider: Tedra Senegal Other Clinician: Referring Provider: Tedra Senegal Treating Provider/Extender: [name], HOYT Weeks in Treatment: 9 Information Obtained from: Patient Chief Complaint Abdominal ulcers Electronic Signature(s) Signed: 11/20/2018 9:19:49 AM By: Worthy Keeler PA-C Entered By: Worthy Keeler on 11/13/2018 13:07:26 [name], [name] ([id number]) -------------------------------------------------------------------------------- HPI Details Patient Name: [name]. Date of Service: 11/13/2018 12:30 PM Medical Record Number: [id number] Patient Account Number: [id number] Date of Birth/Sex: December 31, 1940 (78 y.o. Male) Treating RN: Montey Hora Primary Care Provider: Tedra Senegal Other Clinician: Referring Provider: Tedra Senegal Treating Provider/Extender: [name], HOYT Weeks in Treatment: 9 History of Present Illness HPI Description: 78 year old gentleman seen earlier this year in January for a abdominal wound is now back with the same problem which has recurred for about 2 weeks and he has been trying to apply some local antibiotic ointment and a Band-Aid there. Of note he has significant pressure in this area due to his beltline and may have put on some weight which results in his trousers being very tight around the waist. The else has changed in his HandP and his diabetes is pretty well controlled. most recent hemoglobin A1c on 12/06/2016 was 7.8%. he was reviewed by his endocrinologist Dr. Dwyane Dee who made appropriate modifications to his treatment plan. he was recently seen by neurology for mild cognitive impairment that was found to be normal for his  age 43/21/18 patient appears to be doing well in regard to his abdominal wound area on evaluation today. He has been tolerating the dressing changes with the [facility] Dressing I'm pleased with how this has progressed and in fact the wound appears to be completely healed on evaluation today. There is definitely no evidence of infection. 03/29/17 Patient's wound on the abdominal region appears to be completely healed on evaluation today. This wound has been doing very well and I think the biggest thing is going to be keeping it from reactivation where it rubbed on his pants. 04/18/2017 -- he was recently discharged about 3 weeks ago and was using a binder which probably caused him abrasion on the area of the recently healed scar. 05/02/17 on evaluation today patient appears to be doing fairly well in regard to his abdominal wound at the site of the surgical scar. Unfortunately the scar tissue is very fragile and tends to reopen. He did get a small abdominal binder to try to pad the area in order to offload and prevent his pants from rubbing specifically at the site. With that being said he tells me that Dr. Con Memos was concerned that the binder might actually have rub the area causing a reopening. I did have a look at this today and unfortunately it appears that he was putting this over his pants which was making matters worse. 05/09/17 on evaluation today patient continues to do well with the [facility] Dressing. There is a slight skin tear inferior to the wound that we have been treating that may have been due to the dressing sticking slightly. This is minimal however. I did discuss with patient prior to removal of the dressing for dressing changes that they whet this in order to loosen it and not risk damaging in the field. Nonetheless it appears that they have been doing  very well with the dressing changes he had his wife took over the dressing changes as she states that he was doing it wrong.  Overall though I'm pleased with how things are progressing. No fevers, chills, nausea, or vomiting noted at this time. Readmission: 08/27/17 on evaluation today patient appears to be doing similarly to how he was doing prior to healing previously August 2018 when we previously discharged him. He has fortunately been doing well up until just a couple of weeks ago and he had some of the [facility] Dressing over so he began to treat this on his own. He states that this has gotten much smaller but he ran out of supplies and therefore made an appointment to come back in for evaluation. No fevers, chills, nausea, or vomiting noted at this time. He has no discomfort at this point in regard to the wound. He does tell me that he has one maybe two compression fractures in his lower back and that he is going to be seen by the physician at [facility] imaging, Dr. Estanislado Pandy. ====== Old notes The 78 year old gentleman who has a past medical history of diabetes mellitus, end-stage renal disease on dialysis, hypertension, secondary hyperparathyroidism, peripheral vascular disease and history of previous arterial bifemoral bypass graft in 1992 also has a history of gout, hyperlipidemia, hypothyroidism. last hemoglobin A1c was 7.5%. status post AV fistula Laughery, FELICE DEEM. ([id number]) placement, breast surgery for left granulomatous mastitis, triple a repair in 1992, small bowel obstruction, upper arm angiograms for AV fistula on the left side. was noted to have a superficial skin wound in the periumbilical area since late December 2017. He was asked to clean it with hydrogen peroxide and apply Bactroban. He was seen by vascular surgery in October 2017 where his right ABI was 0.56 left ABI was 0.87 and right TBI was 0.69 and left TBI was 0.70 ===== 09/25/16; patient was previously a patient of Dr. Ardeen Garland who has recurrent skin breakdown in the area of the large incision that was initially 4 apparently an  open AAA aneurysm repair. He had secondary surgery but I'm not sure of what etiology this was. He is been using Hydrofera Blue to the open wounds in the mid abdominal surgical incision. This is now closed over. I think he also uses this area as his waist for his pants and I've asked him to adjust this Readmission: 02/11/18 on evaluation today patient presents for evaluation concerning a sacral pressure ulcer which has been present for several weeks. He states that he actually had several areas that were open there appears just be one remaining. He has been tolerating the dressing changes is wise been using Hydrofera Blue Dressing as left over from his abdominal ulcer that we have previously treated. Fortunately there does not appear to be any evidence of infection which is good news. With that being said he states that he's been having issues at one point even at dialysis he had to discontinue dialysis and go home due to the pain. He has not been using any cushion to sit on although he states he does have one that he can use. No fevers, chills, nausea, or vomiting noted at this time. He had a significant issue with diarrhea prior to this occurring and I believe this may have had some contributing factor as well. 02/18/18 on evaluation today patient's wound actually appears to be doing about the same. Unfortunately he really has not noted any significant improvement over last week he still is  very tender even just to light touch unfortunately over the periwound region. There does not appear to be any evidence of infection which is good news. He is especially tender at the 12 o'clock location even a full fingers with away from the actual wound site. There still slough covering the surface of the wound. 03/04/18 on evaluation today patient's wound actually appears to show signs of great improvement compared to last week's evaluation. He is definitely showing improvement week by week which I think is  great news. Obviously that is what we are looking for. Nonetheless I did not even need to perform any sharp debridement today being that there was no slough noted on the surface of the wound. His blood pressure continues to remain low although this is something that is normal. That is for him. 03/18/18 on evaluation today patient's wound actually appears to potentially be completely healed although I cannot tell for sure. He has a small indeed very small eschar noted over the wound surface which also was measuring smaller than what it was previous. With that being said he has discomfort to the point that I'm not really able to do much with this in order to remove the eschar and prove that everything is completely healed underneath. Nonetheless it's starting to look like this may lift up on its own and I think that may be the best course for him currently. 04/01/18 on evaluation today patient appears to be doing very well in fact his wound is completely healed today. He's been tolerating the dressing changes without complication. With that being said I do believe at this point that he is ready for discharge she still having some discomfort deeper and he tells me this may just be some irritation from the long periods of time he has to sit for dialysis nonetheless he's not having the redness that was previously noted I think he has done very well trying to keep pressure off of this area. Readmission: 09/11/18 on evaluation today patient presents for reevaluation here in our clinic due to a remission with ulcers of the abdominal area. This is a region that he has previously been treated for bar clinic as well we were able to get this healed. 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 He states he was in the hospital when this just suddenly came up toward the end of his hospital stay. There really is nothing else currently going on  indicating more significant issue at this point. Fortunately No fevers, chills, nausea, or vomiting noted at this time. The patient does have diabetes, is on dialysis, and states that the area does tend to hurt but only near the bottom of the wound though this is rather painful. 10/02/18 on evaluation today patient's abdominal ulcer is actually appear to be doing excellent at this point. He's been tolerating the dressing changes without complication. I do believe that this is doing very well for him. No fevers, chills, nausea, or Countess, [name]. ([id number]) vomiting noted at this time. 10/09/18 on evaluation today patient appears to be doing very well in regard to his abdominal ulcers. In fact one area is almost completely healed. The other is shown signs of good epithelialization at this point. Overall I see no signs of infection and things seem to be progressing nicely. 10/16/18 on evaluation today patient appears to be doing well in regard to his abdominal ulcers. He has been tolerating the dressing changes  without complication. Fortunately there is no signs of infection at this time. Overall he seems to be doing very well. 10/23/18 on evaluation today patient actually appears to be doing very well in regard to his abdominal ulcer. This area has continued to heal quite nicely which is excellent news. Overall I'm very pleased with the progress he has made. 11/06/18 on evaluation today patient actually appears to be doing excellent in regard to his which is good news. Fortunately there's no signs of infection at this time. No fevers, chills, nausea, or vomiting noted at this time. In general the patient's been very pleased with how things seem to be progressing which is excellent news as well. Overall there's no signs of infection at this time. 11/13/18 on evaluation today patient appears to be healed in regard to his abdominal ulcer. He's been tolerating the dressing changes without complication.  Fortunately there does not appear to be any signs of infection at this time which is good news. I'm very pleased with how everything seems to be progressing. Electronic Signature(s) Signed: 11/20/2018 9:19:49 AM By: Worthy Keeler PA-C Entered By: Worthy Keeler on 11/14/2018 10:21:13 Massing, [name] ([id number]) -------------------------------------------------------------------------------- Physical Exam Details Patient Name: [name]. Date of Service: 11/13/2018 12:30 PM Medical Record Number: [id number] Patient Account Number: [id number] Date of Birth/Sex: September 28, 1940 (78 y.o. Male) Treating RN: Montey Hora Primary Care Provider: Tedra Senegal Other Clinician: Referring Provider: Tedra Senegal Treating Provider/Extender: [name], HOYT Weeks in Treatment: 9 Constitutional Well-nourished and well-hydrated in no acute distress. Respiratory normal breathing without difficulty. Psychiatric this patient is able to make decisions and demonstrates good insight into disease process. Alert and Oriented x 3. pleasant and cooperative. Notes Patient's wound bed currently shows signs of good epithelialization. Everything appears to be completely healed which is excellent news. I'm very pleased in this regard the patient is happy to hear this. Electronic Signature(s) Signed: 11/20/2018 9:19:49 AM By: Worthy Keeler PA-C Entered By: Worthy Keeler on 11/14/2018 10:21:43 Fasig, [name] ([id number]) -------------------------------------------------------------------------------- Physician Orders Details Patient Name: [name]. Date of Service: 11/13/2018 12:30 PM Medical Record Number: [id number] Patient Account Number: [id number] Date of Birth/Sex: Dec 23, 1940 (78 y.o. 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 Primary Wound Dressing o Other: - Triple antibiotic ointment Secondary Dressing o [place] Follow-up Appointments o Return Appointment in 1 week. Electronic Signature(s) Signed: 11/14/2018 4:09:31 PM By: Army Melia Signed: 11/20/2018 9:19:49 AM By: Worthy Keeler PA-C Entered By: Army Melia on 11/13/2018 13:14:55 Exline, [name] ([id number]) -------------------------------------------------------------------------------- Problem List Details Patient Name: [name]. Date of Service: 11/13/2018 12:30 PM Medical Record Number: [id number] Patient Account Number: [id number] Date of Birth/Sex: [birth year] (78 y.o. 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 Date of Service: 11/13/2018 12:30 PM Medical Record  Number: [id number] Patient Account Number: [id number] Date of Birth/Sex: 10-01-1940 (78 y.o. Male) Treating RN: Montey Hora Primary Care Provider: Tedra Senegal Other Clinician: Referring Provider: Tedra Senegal Treating Provider/Extender: [name], HOYT Weeks in Treatment: 9 Subjective Chief Complaint Information obtained from Patient Abdominal ulcers History of Present Illness (HPI) 78 year old gentleman seen earlier this year in January for a abdominal wound is now back with the same problem which has recurred for about 2 weeks and he has been trying to apply some local antibiotic ointment and a Band-Aid there. Of note he has significant pressure in this area due to his beltline and may have put on some weight which results in his trousers being very tight around the waist. The else has changed in his HandP and his diabetes is pretty well controlled. most recent hemoglobin A1c on 12/06/2016 was 7.8%. he was reviewed by his endocrinologist Dr. Dwyane Dee who made appropriate modifications to his treatment plan. he was recently seen by neurology for mild cognitive impairment that was found to be normal for his age 62/21/18 patient appears to be doing well in regard to his abdominal wound area on evaluation today. He has been tolerating the dressing changes with the [facility] Dressing I'm pleased with how this has progressed and in fact the wound appears to be completely healed on evaluation today. There is definitely no evidence of infection. 03/29/17 Patient's wound on the abdominal region appears to be completely healed on evaluation today. This wound has been doing very well and I think the biggest thing is going to be keeping it from reactivation where it rubbed on his pants. 04/18/2017 -- he was recently discharged about 3 weeks ago and was using a binder which probably caused him abrasion on the area of the recently healed scar. 05/02/17 on evaluation today patient appears to be doing  fairly well in regard to his abdominal wound at the site of the surgical scar. Unfortunately the scar tissue is very fragile and tends to reopen. He did get a small abdominal binder to try to pad the area in order to offload and prevent his pants from rubbing specifically at the site. With that being said he tells me that Dr. Con Memos was concerned that the binder might actually have rub the area causing a reopening. I did have a look at this today and unfortunately it appears that he was putting this over his pants which was making matters worse. 05/09/17 on evaluation today patient continues to do well with the [facility] Dressing. There is a slight skin tear inferior to the wound that we have been treating that may have been due to the dressing sticking slightly. This is minimal however. I did discuss with patient prior to removal of the dressing for dressing changes that they whet this in order to loosen it and not risk damaging in the field. Nonetheless it appears that they have been doing very well with the dressing changes he had his wife took over the dressing changes as she states that he was doing it wrong. Overall though I'm pleased with how things are progressing. No fevers, chills, nausea, or vomiting noted  at this time. Readmission: 08/27/17 on evaluation today patient appears to be doing similarly to how he was doing prior to healing previously August 2018 when we previously discharged him. He has fortunately been doing well up until just a couple of weeks ago and he had some of the [facility] Dressing over so he began to treat this on his own. He states that this has gotten much smaller but he ran out of supplies and therefore made an appointment to come back in for evaluation. No fevers, chills, nausea, or vomiting noted at this time. He has no discomfort at this point in regard to the wound. He does tell me that he has one maybe two compression fractures in his lower back and  that he is going to be seen by the physician at [facility] imaging, Dr. Estanislado Pandy. [name], [name] ([id number]) ====== Old notes The 78 year old gentleman who has a past medical history of diabetes mellitus, end-stage renal disease on dialysis, hypertension, secondary hyperparathyroidism, peripheral vascular disease and history of previous arterial bifemoral bypass graft in 1992 also has a history of gout, hyperlipidemia, hypothyroidism. last hemoglobin A1c was 7.5%. status post AV fistula placement, breast surgery for left granulomatous mastitis, triple a repair in 1992, small bowel obstruction, upper arm angiograms for AV fistula on the left side. was noted to have a superficial skin wound in the periumbilical area since late December 2017. He was asked to clean it with hydrogen peroxide and apply Bactroban. He was seen by vascular surgery in October 2017 where his right ABI was 0.56 left ABI was 0.87 and right TBI was 0.69 and left TBI was 0.70 ===== 09/25/16; patient was previously a patient of Dr. Ardeen Garland who has recurrent skin breakdown in the area of the large incision that was initially 4 apparently an open AAA aneurysm repair. He had secondary surgery but I'm not sure of what etiology this was. He is been using Hydrofera Blue to the open wounds in the mid abdominal surgical incision. This is now closed over. I think he also uses this area as his waist for his pants and I've asked him to adjust this Readmission: 02/11/18 on evaluation today patient presents for evaluation concerning a sacral pressure ulcer which has been present for several weeks. He states that he actually had several areas that were open there appears just be one remaining. He has been tolerating the dressing changes is wise been using Hydrofera Blue Dressing as left over from his abdominal ulcer that we have previously treated. Fortunately there does not appear to be any evidence of infection which is good news. With  that being said he states that he's been having issues at one point even at dialysis he had to discontinue dialysis and go home due to the pain. He has not been using any cushion to sit on although he states he does have one that he can use. No fevers, chills, nausea, or vomiting noted at this time. He had a significant issue with diarrhea prior to this occurring and I believe this may have had some contributing factor as well. 02/18/18 on evaluation today patient's wound actually appears to be doing about the same. Unfortunately he really has not noted any significant improvement over last week he still is very tender even just to light touch unfortunately over the periwound region. There does not appear to be any evidence of infection which is good news. He is especially tender at the 12 o'clock location even a full fingers with  away from the actual wound site. There still slough covering the surface of the wound. 03/04/18 on evaluation today patient's wound actually appears to show signs of great improvement compared to last week's evaluation. He is definitely showing improvement week by week which I think is great news. Obviously that is what we are looking for. Nonetheless I did not even need to perform any sharp debridement today being that there was no slough noted on the surface of the wound. His blood pressure continues to remain low although this is something that is normal. That is for him. 03/18/18 on evaluation today patient's wound actually appears to potentially be completely healed although I cannot tell for sure. He has a small indeed very small eschar noted over the wound surface which also was measuring smaller than what it was previous. With that being said he has discomfort to the point that I'm not really able to do much with this in order to remove the eschar and prove that everything is completely healed underneath. Nonetheless it's starting to look like this may lift up on  its own and I think that may be the best course for him currently. 04/01/18 on evaluation today patient appears to be doing very well in fact his wound is completely healed today. He's been tolerating the dressing changes without complication. With that being said I do believe at this point that he is ready for discharge she still having some discomfort deeper and he tells me this may just be some irritation from the long periods of time he has to sit for dialysis nonetheless he's not having the redness that was previously noted I think he has done very well trying to keep pressure off of this area. Readmission: 09/11/18 on evaluation today patient presents for reevaluation here in our clinic due to a remission with ulcers of the abdominal area. This is a region that he has previously been treated for bar clinic as well we were able to get this healed. 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 He states he was in the hospital when this just suddenly came up toward the end of his hospital stay. There really is nothing else currently going on indicating more significant issue at this point. Fortunately No fevers, chills, nausea, or vomiting noted at this time. [name], [name] ([id number]) The patient does have diabetes, is on dialysis, and states that the area does tend to hurt but only near the bottom of the wound though this is rather painful. 10/02/18 on evaluation today patient's abdominal ulcer is actually appear to be doing excellent at this point. He's been tolerating the dressing changes without complication. I do believe that this is doing very well for him. No fevers, chills, nausea, or vomiting noted at this time. 10/09/18 on evaluation today patient appears to be doing very well in regard to his abdominal ulcers. In fact one area is almost completely healed. The other is shown signs of good epithelialization at this point.  Overall I see no signs of infection and things seem to be progressing nicely. 10/16/18 on evaluation today patient appears to be doing well in regard to his abdominal ulcers. He has been tolerating the dressing changes without complication. Fortunately there is no signs of infection at this time. Overall he seems to be doing very well. 10/23/18 on evaluation today patient actually appears to be doing very well in regard to his abdominal ulcer. This area  has continued to heal quite nicely which is excellent news. Overall I'm very pleased with the progress he has made. 11/06/18 on evaluation today patient actually appears to be doing excellent in regard to his which is good news. Fortunately there's no signs of infection at this time. No fevers, chills, nausea, or vomiting noted at this time. In general the patient's been very pleased with how things seem to be progressing which is excellent news as well. Overall there's no signs of infection at this time. 11/13/18 on evaluation today patient appears to be healed in regard to his abdominal ulcer. He's been tolerating the dressing changes without complication. Fortunately there does not appear to be any signs of infection at this time which is good news. I'm very pleased with how everything seems to be progressing. Patient History Information obtained from Patient. Family History Diabetes - Father,Paternal Grandparents, Heart Disease - Father, Hypertension - Father, No family history of Cancer, Hereditary Spherocytosis, Kidney Disease, Lung Disease, Seizures, Stroke, Thyroid Problems, Tuberculosis. Social History Former smoker - quit in 1996, Marital Status - Married, Alcohol Use - Never - quit in 1976, Drug Use - No History, Caffeine Use - Never. Medical History Eyes Denies history of Cataracts, Glaucoma, Optic Neuritis Ear/Nose/Mouth/Throat Denies history of Chronic sinus problems/congestion, Middle ear problems Hematologic/Lymphatic Patient  has history of Anemia Denies history of Hemophilia, Human Immunodeficiency Virus, Lymphedema, Sickle Cell Disease Cardiovascular Patient has history of Hypertension, Peripheral Venous Disease Denies history of Angina, Arrhythmia, Congestive Heart Failure, Coronary Artery Disease, Deep Vein Thrombosis, Hypotension, Myocardial Infarction, Peripheral Arterial Disease, Phlebitis, Vasculitis Gastrointestinal Denies history of Cirrhosis , Colitis, Crohn s, Hepatitis A, Hepatitis B, Hepatitis C Endocrine Patient has history of Type II Diabetes Denies history of Type I Diabetes Genitourinary 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 Immunological Maggard, EUDELL MCPHEE ([id number]) Denies history of Lupus Erythematosus, Raynaud s, Scleroderma Integumentary (Skin) Denies history of History of Burn, History of pressure wounds Musculoskeletal Patient has history of Gout Denies history of Rheumatoid Arthritis, Osteoarthritis, Osteomyelitis Neurologic Patient has history of Neuropathy Denies history of Dementia, Quadriplegia, Paraplegia, Seizure Disorder Oncologic Denies history of Received Chemotherapy, Received Radiation Psychiatric Denies history of Anorexia/bulimia, Confinement Anxiety 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 Respiratory The patient has no complaints or symptoms. Cardiovascular The patient has no complaints or symptoms. Psychiatric The patient has no complaints or symptoms. Objective Constitutional Well-nourished and well-hydrated in no acute distress. Vitals Time Taken: 12:43 PM, Height: 71 in, Weight: 146 lbs, BMI: 20.4, Temperature: 97.5 F, Pulse: 74 bpm, Respiratory Rate: 16 breaths/min, Blood Pressure: 127/76 mmHg. Respiratory normal  breathing without difficulty. Psychiatric this patient is able to make decisions and demonstrates good insight into disease process. Alert and Oriented x 3. pleasant and cooperative. General Notes: Patient's wound bed currently shows signs of good epithelialization. Everything appears to be completely healed which is excellent news. I'm very pleased in this regard the patient is happy to hear this. Integumentary (Hair, Skin) [name], [name] ([id number]) Wound #6 status is Healed - Epithelialized. Original cause of wound was Blister. The wound is located on the Right Abdomen - Lower Quadrant. The wound measures 0cm length x 0cm width x 0cm depth; 0cm^2 area and 0cm^3 volume. There is Fat Layer (Subcutaneous Tissue) Exposed exposed. There is no tunneling or undermining noted. There is a none present  amount of drainage noted. The wound margin is flat and intact. There is no granulation within the wound bed. There is no necrotic tissue within the wound bed. The periwound skin appearance exhibited: Scarring. The periwound skin appearance did not exhibit: Callus, Crepitus, Excoriation, Induration, Rash, Dry/Scaly, Maceration, Atrophie Blanche, Cyanosis, Ecchymosis, Hemosiderin Staining, Mottled, Pallor, Rubor, Erythema. Periwound temperature was noted as No Abnormality. The periwound has tenderness on palpation. 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 Primary Wound Dressing: Other: - Triple antibiotic ointment Secondary Dressing: [place] Follow-up Appointments: Return Appointment in 1 week. My suggestion was that we discontinue wound care services at this point although the patient did want to have me reevaluate in one week to make sure everything is  still close. For that reason I will see him back in one week. With that being said I really see nothing that I think is going to be a problem between now and then. If anything does change she knows contact the office and let me know. Please see above for specific wound care orders. We will see patient for re-evaluation in 1 week(s) here in the clinic. If anything worsens or changes patient will contact our office for additional recommendations. Electronic Signature(s) Signed: 11/20/2018 9:19:49 AM By: Worthy Keeler PA-C Entered By: Worthy Keeler on 11/14/2018 10:21:55 [name], [name] ([id number]) -------------------------------------------------------------------------------- ROS/PFSH Details Patient Name: [name]. Date of Service: 11/13/2018 12:30 PM Medical Record Number: [id number] Patient Account Number: [id number] Date of Birth/Sex: 04/06/1941 (78 y.o. Male) Treating RN: Montey Hora Primary Care Provider: Tedra Senegal Other Clinician: Referring Provider: Tedra Senegal Treating Provider/Extender: [name], HOYT Weeks in Treatment: 9 Information Obtained From Patient Wound History Do you currently have one or more open woundso Yes How many open wounds do you currently haveo 2 Approximately how long have you had your woundso 2 weeks How have you been treating your wound(s) until nowo zinc ,drsg Has your wound(s) ever healed and then re-openedo No Have you had any lab work done in the past montho Yes Who ordered the lab work doneo [facility] Have you tested positive for an antibiotic resistant organism (MRSA, VRE)o No Have you tested positive for osteomyelitis (bone infection)o No Have you had any tests for circulation on your legso No Constitutional Symptoms (General Health) Complaints and Symptoms: Negative for: Fever; Chills Eyes Medical History: Negative for: Cataracts; Glaucoma; Optic Neuritis Ear/Nose/Mouth/Throat Medical History: Negative for:  Chronic sinus problems/congestion; Middle ear problems Hematologic/Lymphatic Medical History: Positive for: Anemia Negative for: Hemophilia; Human Immunodeficiency Virus; Lymphedema; Sickle Cell Disease Respiratory Complaints and Symptoms: No Complaints or Symptoms Cardiovascular Complaints and Symptoms: No Complaints or Symptoms Medical History: Positive for: Hypertension; Peripheral Venous Disease Negative for: Angina; Arrhythmia; Congestive Heart Failure; Coronary Artery Disease; Deep Vein Thrombosis; Hypotension; Myocardial Infarction; Peripheral Arterial Disease; Phlebitis; Vasculitis [name], [name] ([id number]) Past Medical History Notes: artificial valve, hyperlipidemia Gastrointestinal Medical History: Negative for: Cirrhosis ; Colitis; Crohnos; Hepatitis A; Hepatitis B; Hepatitis C Past Medical History Notes: scar tissue from multiple surgeries Endocrine Medical History: Positive for: Type II Diabetes Negative for: Type I Diabetes Treated with: Oral agents Genitourinary Medical History: 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 Immunological Medical History: Negative for: Lupus Erythematosus; Raynaudos; Scleroderma Integumentary (Skin) Medical History: Negative for: History  of Burn; History of pressure wounds Musculoskeletal Medical History: Positive for: Gout Negative for: Rheumatoid Arthritis; Osteoarthritis; Osteomyelitis Neurologic Medical History: Positive for: Neuropathy Negative for: Dementia; Quadriplegia; Paraplegia; Seizure Disorder Oncologic Medical History: Negative for: Received Chemotherapy; Received Radiation Psychiatric Complaints and Symptoms: No Complaints or Symptoms Medical History: Negative for: Anorexia/bulimia; Confinement Anxiety Immunizations [name], [name] ([id number]) Pneumococcal Vaccine: Received Pneumococcal Vaccination: Yes Immunization Notes: uo to date Implantable Devices Family and Social  History Cancer: No; Diabetes: Yes - Father,Paternal Grandparents; Heart Disease: Yes - Father; Hereditary Spherocytosis: No; Hypertension: Yes - Father; Kidney Disease: No; Lung Disease: No; Seizures: No; Stroke: No; Thyroid Problems: No; Tuberculosis: No; Former smoker - quit in 1996; Marital Status - Married; Alcohol Use: Never - quit in 1976; Drug Use: No History; Caffeine Use: Never; Financial Concerns: No; Food, Clothing or Shelter Needs: No; Support System Lacking: No; Transportation Concerns: No; Advanced Directives: No; Patient does not want information on Advanced Directives Physician Affirmation I have reviewed and agree with the above information. Electronic Signature(s) Signed: 11/14/2018 4:37:43 PM By: Montey Hora Signed: 11/20/2018 9:19:49 AM By: Worthy Keeler PA-C Entered By: Worthy Keeler on 11/14/2018 10:21:29 [name], [name] ([id number]) -------------------------------------------------------------------------------- SuperBill Details Patient Name: [name]. Date of Service: 11/13/2018 Medical Record Number: [id number] Patient Account Number: [id number] Date of Birth/Sex: Oct 17, 1940 (78 y.o. Male) Treating RN: Montey Hora Primary Care Provider: Tedra Senegal Other Clinician: Referring Provider: Tedra Senegal Treating Provider/Extender: [name], HOYT Weeks in Treatment: 9 Diagnosis Coding ICD-10 Codes Code Description E83.59 Other disorders of calcium metabolism L98.492 Non-pressure chronic ulcer of skin of other sites with fat layer exposed E11.622 Type 2 diabetes mellitus with other skin ulcer Z99.2 Dependence on renal dialysis Facility Procedures CPT4 Code: [id number] Description: [phone] - WOUND CARE VISIT-LEV 2 EST PT Modifier: Quantity: 1 Physician Procedures CPT4 Code: [id number] Description: 50354 - WC PHYS LEVEL 3 - EST PT ICD-10 Diagnosis Description E83.59 Other disorders of calcium metabolism L98.492 Non-pressure chronic ulcer of skin of other  sites with fat l E11.622 Type 2 diabetes mellitus with other skin ulcer Z99.2  Dependence on renal dialysis Modifier: ayer exposed Quantity: 1 Electronic Signature(s) Signed: 11/20/2018 9:19:49 AM By: Worthy Keeler PA-C Entered By: Worthy Keeler on 11/14/2018 10:22:10

## 2018-11-21 NOTE — Progress Notes (Signed)
Patient was discharged to home. AVS was reviewed with the patient and his wife, all questions answered. No changes made to medications. They are aware he will dialyze tomorrow at his center at 7:20am. Reviewed all follow-up appointments. Patient left floor via wheelchair with staff member

## 2018-11-21 NOTE — Anesthesia Postprocedure Evaluation (Signed)
Anesthesia Post Note  Patient: [name]  Procedure(s) Performed: ENTEROSCOPY (N/A ) HOT HEMOSTASIS (ARGON PLASMA COAGULATION/BICAP) (N/A ) SUBMUCOSAL TATTOO INJECTION     Patient location during evaluation: Endoscopy Anesthesia Type: MAC Level of consciousness: awake and alert Pain management: pain level controlled Vital Signs Assessment: post-procedure vital signs reviewed and stable Respiratory status: spontaneous breathing, nonlabored ventilation, respiratory function stable and patient connected to nasal cannula oxygen Cardiovascular status: blood pressure returned to baseline and stable Postop Assessment: no apparent nausea or vomiting Anesthetic complications: no    Last Vitals:  Vitals:   11/21/18 0405 11/21/18 0943  BP: (!) 112/52 133/77  Pulse: 80 75  Resp: 17 18  Temp: 36.9 C 36.8 C  SpO2: 100% 97%    Last Pain:  Vitals:   11/21/18 1207  TempSrc:   PainSc: 0-No pain                 Chelsey L Woodrum

## 2018-11-22 DIAGNOSIS — E1129 Type 2 diabetes mellitus with other diabetic kidney complication: Secondary | ICD-10-CM | POA: Diagnosis not present

## 2018-11-22 DIAGNOSIS — N2581 Secondary hyperparathyroidism of renal origin: Secondary | ICD-10-CM | POA: Diagnosis not present

## 2018-11-22 DIAGNOSIS — D631 Anemia in chronic kidney disease: Secondary | ICD-10-CM | POA: Diagnosis not present

## 2018-11-22 DIAGNOSIS — D509 Iron deficiency anemia, unspecified: Secondary | ICD-10-CM | POA: Diagnosis not present

## 2018-11-22 DIAGNOSIS — N186 End stage renal disease: Secondary | ICD-10-CM | POA: Diagnosis not present

## 2018-11-22 LAB — RENAL FUNCTION PANEL
Albumin: 2.4 g/dL — ABNORMAL LOW (ref 3.5–5.0)
Anion gap: 10 (ref 5–15)
BUN: 22 mg/dL (ref 8–23)
CO2: 27 mmol/L (ref 22–32)
Calcium: 7.2 mg/dL — ABNORMAL LOW (ref 8.9–10.3)
Chloride: 100 mmol/L (ref 98–111)
Creatinine, Ser: 4.03 mg/dL — ABNORMAL HIGH (ref 0.61–1.24)
GFR calc Af Amer: 16 mL/min — ABNORMAL LOW (ref >60)
GFR calc non Af Amer: 13 mL/min — ABNORMAL LOW (ref >60)
Glucose, Bld: 129 mg/dL — ABNORMAL HIGH (ref 70–99)
Phosphorus: 2.6 mg/dL (ref 2.5–4.6)
Potassium: 3.4 mmol/L — ABNORMAL LOW (ref 3.5–5.1)
Sodium: 137 mmol/L (ref 135–145)

## 2018-11-23 ENCOUNTER — Encounter (HOSPITAL_COMMUNITY): Payer: Self-pay | Admitting: Gastroenterology

## 2018-11-23 DIAGNOSIS — N186 End stage renal disease: Secondary | ICD-10-CM | POA: Diagnosis not present

## 2018-11-23 DIAGNOSIS — Z992 Dependence on renal dialysis: Secondary | ICD-10-CM | POA: Diagnosis not present

## 2018-11-23 DIAGNOSIS — E1122 Type 2 diabetes mellitus with diabetic chronic kidney disease: Secondary | ICD-10-CM | POA: Diagnosis not present

## 2018-11-24 ENCOUNTER — Telehealth: Payer: Self-pay

## 2018-11-24 DIAGNOSIS — N2581 Secondary hyperparathyroidism of renal origin: Secondary | ICD-10-CM | POA: Diagnosis not present

## 2018-11-24 DIAGNOSIS — D631 Anemia in chronic kidney disease: Secondary | ICD-10-CM | POA: Diagnosis not present

## 2018-11-24 DIAGNOSIS — D509 Iron deficiency anemia, unspecified: Secondary | ICD-10-CM | POA: Diagnosis not present

## 2018-11-24 DIAGNOSIS — E1129 Type 2 diabetes mellitus with other diabetic kidney complication: Secondary | ICD-10-CM | POA: Diagnosis not present

## 2018-11-24 DIAGNOSIS — N186 End stage renal disease: Secondary | ICD-10-CM | POA: Diagnosis not present

## 2018-11-24 NOTE — Telephone Encounter (Signed)
Called patient's wife to see how patient is doing after being discharged from the hospital last week and to go over patient's medications. Wife states patient is doing well, he was started on cinacalcet (SENSIPAR) 30 MG one tab po daily with super, he was also put back on repaglinide (PRANDIN) 0.5 MG tablet one tab po daily with lunch. Patient has a f/u with GI on 12/26/2018, he is also going to see Dr. Posey Pronto on 3/06 for a rehab follow-up. I have scheduled a hospital follow up with Dr. Renold Genta for 11/27/18 at 12:15pm.

## 2018-11-25 ENCOUNTER — Telehealth: Payer: Self-pay

## 2018-11-25 ENCOUNTER — Ambulatory Visit (INDEPENDENT_AMBULATORY_CARE_PROVIDER_SITE_OTHER): Payer: Medicare Other | Admitting: Endocrinology

## 2018-11-25 ENCOUNTER — Other Ambulatory Visit: Payer: Self-pay

## 2018-11-25 ENCOUNTER — Encounter: Payer: Self-pay | Admitting: Endocrinology

## 2018-11-25 VITALS — BP 124/84 | HR 83 | Ht 71.0 in | Wt 146.4 lb

## 2018-11-25 DIAGNOSIS — E1165 Type 2 diabetes mellitus with hyperglycemia: Secondary | ICD-10-CM | POA: Diagnosis not present

## 2018-11-25 MED ORDER — INSULIN ASPART 100 UNIT/ML FLEXPEN: PEN_INJECTOR | SUBCUTANEOUS | 12 refills | Status: DC

## 2018-11-25 MED ORDER — PEN NEEDLES 32G X 4 MM MISC: 1.0000 | Freq: Three times a day (TID) | 12 refills | Status: DC

## 2018-11-25 NOTE — Telephone Encounter (Signed)
Dr. Loletha Grayer,  1) I cannot find documentation of you accepting this patient switching from Dr. Rea College you accept this patient?  2) Dr. Rush Landmark has requested the patient be seen at a post hospitalization f/u appt.  schedule this with you or revert this patient back to Dr. Lucianne Lei?  Please advise

## 2018-11-25 NOTE — Telephone Encounter (Signed)
-----   Message from Timothy Lasso, RN sent at 11/24/2018  8:28 AM EST ----- Regarding: FW: Follow-up  ----- Message ----- From: Mauri Pole, MD Sent: 11/22/2018  11:24 AM EST To: Timothy Lasso, RN, Greggory Keen, LPN Subject: FW: Follow-up                                  Patient switched to Dr Bryan Lemma. Please schedule follow up visit with him. Thanks VN ----- Message ----- From: Irving Copas., MD Sent: 11/21/2018  10:22 PM EST To: Timothy Lasso, RN, Mauri Pole, MD, # Subject: Follow-up                                      Patty,I saw this patient during his inpatient work-up however he is Dr. Woodward Ku patient.  Can you please forward this to her nurse to set up a posthospitalization follow-up?Thanks.GM

## 2018-11-25 NOTE — Progress Notes (Signed)
Patient ID: [name], male   DOB: 1941-09-17, 78 y.o.   MRN: [id number]           Reason for Appointment:  Follow-up for endocrinology problems   History of Present Illness:          Date of diagnosis of type 2 diabetes mellitus:  1996      Background history:  He has had long-standing diabetes probably treated with metformin initially and subsequently with sulfonylurea drugs At some point he was also given Actos in addition to his glipizide which he is still taking Records of his control are only available for the last 4 years Over the last few years his A1c has been generally in the 6-7% range His A1c was 6.2 in May 2016 and apparently was starting to get low normal blood sugars at times He was admitted to the hospital for fluid overload and renal failure and at that time his Actos was stopped Subsequently his blood sugars had been markedly increased He was switched from regular insulin to NovoLog on his initial consultation  Recent history:    Non-insulin hypoglycemic drugs: Tradjenta 5 mg a morning and Prandin 0.5 mg at lunch  His A1c is 5.4 last and fructosamine is 344   Current blood sugar patterns and problems identified:  He has been  on Prandin in addition to his Tradjenta 5 mg daily since after his last visit when he reported high readings after lunch  However review of his home blood sugars indicate marked increase in blood sugars in the afternoon and evenings  He appears to be checking his blood sugars randomly in the evenings some before and some after eating and these are averaging well over 200  However FASTING readings are only mildly increased overall  Although he thinks he is eating candy and chocolates this is likely not new  He still has some Ensure supplements  Also he thinks that his appetite has increased significantly compared to his last visit and he is starting to gain weight  Fructosamine indicates overall higher readings  recently   Dialysis is done at 12 pm, doing it on Monday/Wednesday/Fridays     Side effects from medications have been: None  Compliance with the medical regimen: Fair  Glucose monitoring:  done 0-1  times a day         Glucometer:  Freestyle  Blood Glucose readings by review of monitor: Download results as below   PRE-MEAL Fasting Lunch Dinner Bedtime Overall  Glucose range:    76-315    Mean/median:  140     216   POST-MEAL PC Breakfast PC Lunch PC Dinner  Glucose range:   202-381  192-364  Mean/median:    284      Self-care: The diet that the patient has been following is: tries to limit sweets, portions. eating out only about once a week at a steak house        Typical meal intake: Breakfast is variable; supper 8 pm            Dietician visit, most recent: 08/2017               Exercise:  Not able to do any  Weight history:   Wt Readings from Last 3 Encounters:  11/25/18 146 lb 6.4 oz (66.4 kg)  11/21/18 163 lb 2.3 oz (74 kg)  10/09/18 140 lb (63.5 kg)    Glycemic control:    Lab Results  Component Value Date  HGBA1C 5.4 09/09/2018   HGBA1C 5.2 06/01/2018   HGBA1C 6.3 04/17/2018   Lab Results  Component Value Date   MICROALBUR 37.2 09/03/2016   LDLCALC 21 09/05/2017   CREATININE 4.03 (H) 11/21/2018    Lab Results  Component Value Date   FRUCTOSAMINE 344 (H) 11/13/2018   FRUCTOSAMINE 295 (H) 09/09/2018   FRUCTOSAMINE 289 (H) 04/17/2018    PROBLEM 2 Osteoporosis: See review of systems     Allergies as of 11/25/2018      Reactions   Ambien [zolpidem Tartrate] Other (See Comments)   hallucinations and "felt crazy"    Penicillins Rash   Has patient had a PCN reaction causing immediate rash, facial/tongue/throat swelling, SOB or lightheadedness with hypotension: Yes Has patient had a PCN reaction causing severe rash involving mucus membranes or skin necrosis: Yes Has patient had a PCN reaction that required hospitalization: No Has patient had a  PCN reaction occurring within the last 10 years: No If all of the above answers are "NO", then may proceed with Cephalosporin use.      Medication List       Accurate as of November 25, 2018  2:32 PM. Always use your most recent med list.        acetaminophen 325 MG tablet Commonly known as:  TYLENOL Take 1-2 tablets (325-650 mg total) by mouth every 4 (four) hours as needed for mild pain.   allopurinol 100 MG tablet Commonly known as:  ZYLOPRIM Take 100 mg by mouth daily.   Carboxymethylcellulose Sod PF 0.25 % Soln Place 1 drop into both eyes as needed (dry eyes).   cinacalcet 30 MG tablet Commonly known as:  SENSIPAR Take 1 tablet (30 mg total) by mouth daily with supper.   clobetasol 0.05 % external solution Commonly known as:  TEMOVATE Apply 1 application topically 2 (two) times daily as needed (irritation). Apply to scalp   denosumab 60 MG/ML Sosy injection Commonly known as:  PROLIA Inject 60 mg into the skin every 6 (six) months.   docusate sodium 100 MG capsule Commonly known as:  COLACE Take 1 capsule (100 mg total) by mouth daily.   feeding supplement (NEPRO CARB STEADY) Liqd Take 237 mLs by mouth daily.   Fluocinolone Acetonide Scalp 0.01 % Oil Apply 1 application topically at bedtime. Apply to scalp   fluticasone 0.05 % cream Commonly known as:  CUTIVATE Apply 1 application topically 2 (two) times daily as needed (irritation). Apply to face   fluticasone 50 MCG/ACT nasal spray Commonly known as:  FLONASE Place 1 spray into both nostrils daily.   FREESTYLE FREEDOM LITE w/Device Kit Use to check blood sugar 2 times per day dx code E11.65   freestyle lancets Use as instructed to check blood sugar 2 times per day dx code E11.65   glucose blood test strip Commonly known as:  FREESTYLE LITE Use as instructed to check blood sugar 3 times daily.   insulin aspart 100 UNIT/ML FlexPen Commonly known as:  NOVOLOG FLEXPEN INJECT 6-8 UNITS UNDER THE SKIN  THREE TIMES DAILY BEFORE MEALS. DX:E11.65   ketoconazole 2 % shampoo Commonly known as:  NIZORAL Apply 1 application topically 2 (two) times a week.   levothyroxine 50 MCG tablet Commonly known as:  SYNTHROID, LEVOTHROID Take 1 tablet (50 mcg total) by mouth daily.   linagliptin 5 MG Tabs tablet Commonly known as:  TRADJENTA Take 1 tablet (5 mg total) by mouth daily.   memantine 10 MG tablet Commonly known as:  NAMENDA Take 1 tablet (10 mg total) by mouth 2 (two) times daily.   midodrine 10 MG tablet Commonly known as:  PROAMATINE Take one pill prior to hemodialysis on MWF   Naftifine HCl 2 % Crea Commonly known as:  NAFTIN Apply to feet daily for fungal infection   omeprazole 40 MG capsule Commonly known as:  PRILOSEC Take 1 capsule (40 mg total) by mouth daily.   Pen Needles 32G X 4 MM Misc 1 each by Does not apply route 3 (three) times daily. USE PEN NEEDLES TO INJECT INSULIN THREE TIMES DAILY.   polyethylene glycol packet Commonly known as:  MIRALAX / GLYCOLAX Take 17 g by mouth daily.   repaglinide 0.5 MG tablet Commonly known as:  PRANDIN Take 1 tablet (0.5 mg total) by mouth daily with lunch.   senna 8.6 MG Tabs tablet Commonly known as:  SENOKOT Take 1 tablet (8.6 mg total) by mouth at bedtime.   sevelamer carbonate 800 MG tablet Commonly known as:  RENVELA Take 1,600 mg by mouth 3 (three) times daily before meals.   simvastatin 20 MG tablet Commonly known as:  ZOCOR Take 20 mg by mouth at bedtime.   vitamin B-12 1000 MCG tablet Commonly known as:  CYANOCOBALAMIN Take 1 tablet (1,000 mcg total) by mouth daily.       Allergies:  Allergies  Allergen Reactions  . Ambien [Zolpidem Tartrate] Other (See Comments)    hallucinations and "felt crazy"   . Penicillins Rash    Has patient had a PCN reaction causing immediate rash, facial/tongue/throat swelling, SOB or lightheadedness with hypotension: Yes Has patient had a PCN reaction causing severe  rash involving mucus membranes or skin necrosis: Yes Has patient had a PCN reaction that required hospitalization: No Has patient had a PCN reaction occurring within the last 10 years: No If all of the above answers are "NO", then may proceed with Cephalosporin use.     Past Medical History:  Diagnosis Date  . Allergy   . Anemia   . Arthritis   . Cataract    bil cateracts removed  . Coronary artery disease   . Dementia arising in the senium and presenium ([place])   . Diabetes mellitus    Type 2  . Diverticulitis   . ED (erectile dysfunction)   . Elevated homocysteine ([place])   . ESRD (end stage renal disease) on dialysis ([place]) 03/2015  . GERD (gastroesophageal reflux disease)    pepto   . Gout   . Hyperlipidemia   . Hypertension   . Hypothyroidism   . Pneumonia   . PVD (peripheral vascular disease) ([place])    has plastic aorta  . Seasonal allergies   . Sleep apnea    does not wear c-pap    Past Surgical History:  Procedure Laterality Date  . aortobifemoral bypass    . AV FISTULA PLACEMENT Left 12/01/2013   Procedure: ARTERIOVENOUS (AV) FISTULA CREATION- LEFT BRACHIOCEPHALIC;  Surgeon: Angelia Mould, MD;  Location: [place];  Service: Vascular;  Laterality: Left;  . [place] TRANSPOSITION Right 07/27/2014   Procedure: BASCILIC VEIN TRANSPOSITION;  Surgeon: Angelia Mould, MD;  Location: [place];  Service: Vascular;  Laterality: Right;  . BREAST SURGERY     left - granulomatous mastitis  . BURR HOLE Bilateral 07/22/2018   Procedure: BILATERAL BURR HOLES;  Surgeon: Kristeen Miss, MD;  Location: [place];  Service: Neurosurgery;  Laterality: Bilateral;  . COLONOSCOPY    . 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 ENTEROSCOPY N/A 06/03/2018   Procedure: ENTEROSCOPY;  Surgeon: Lavena Bullion, DO;  Location: [place];  Service: Gastroenterology;  Laterality: N/A;  . ENTEROSCOPY N/A 11/20/2018   Procedure: ENTEROSCOPY;  Surgeon: Rush Landmark Telford Nab.,  MD;  Location[facility] ENDOSCOPY;  Service: Gastroenterology;  Laterality: N/A;  . EYE SURGERY Bilateral    cataracts  . HEMODIALYSIS INPATIENT  01/17/2018      . HOT HEMOSTASIS N/A 06/03/2018   Procedure: HOT HEMOSTASIS (ARGON PLASMA COAGULATION/BICAP);  Surgeon: Lavena Bullion, DO;  Location: [facility] ENDOSCOPY;  Service: Gastroenterology;  Laterality: N/A;  . HOT HEMOSTASIS N/A 11/20/2018   Procedure: HOT HEMOSTASIS (ARGON PLASMA COAGULATION/BICAP);  Surgeon: Irving Copas., MD;  Location: [place];  Service: Gastroenterology;  Laterality: N/A;  . REVISON OF ARTERIOVENOUS FISTULA Left 02/09/2014   Procedure: REVISON OF LEFT ARTERIOVENOUS FISTULA - RESECTION OF RENDUNDANT VEIN;  Surgeon: Angelia Mould, MD;  Location: [place];  Service: Vascular;  Laterality: Left;  . SBO with lysis adhesions    . SHUNTOGRAM Left 04/19/2014   Procedure: FISTULOGRAM;  Surgeon: Angelia Mould, MD;  Location: [facility] CATH LAB;  Service: Cardiovascular;  Laterality: Left;  . UNILATERAL UPPER EXTREMEITY ANGIOGRAM N/A 07/12/2014   Procedure: UNILATERAL UPPER Anselmo Rod;  Surgeon: Angelia Mould, MD;  Location: [facility] CATH LAB;  Service: Cardiovascular;  Laterality: N/A;    Family History  Problem Relation Age of Onset  . Aneurysm Mother   . Heart disease Father   . Stroke Father   . Hypertension Father   . Diabetes Father   . Dementia Neg Hx   . Colon cancer Neg Hx   . Esophageal cancer Neg Hx   . Pancreatic cancer Neg Hx   . Prostate cancer Neg Hx   . Rectal cancer Neg Hx   . Stomach cancer Neg Hx     Social History:  reports that he quit smoking about 24 years ago. He has never used smokeless tobacco. He reports that he does not drink alcohol or use drugs.    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 he is not a candidate for Forteo and nephrologist suggested Prolia He has had his first injection of Prolia 5/19 and repeat in 12/19 No recent back pain  His hospital discharge indicates he had rib fractures after his fall in October  Calcium corrected for his albumin is normal Is taking any Sensipar currently  Lab Results  Component Value Date   PTH 154 (H) 10/06/2014   CALCIUM 7.2 (L) 11/21/2018   CAION 1.33 07/21/2018   PHOS 2.6 11/21/2018     Lipid history: He has been treated with Simvastatin 20 mg  by PCP, results as follows    Lab Results  Component Value Date   CHOL 79 09/05/2017   HDL 38.00 (L) 09/05/2017   LDLCALC 21 09/05/2017   TRIG 99.0 09/05/2017   CHOLHDL 2 09/05/2017          He has ESRD on dialysis    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  Mild hypothyroidism ?: On 50 mcg levothyroxine with normal TSH levels  Lab Results  Component Value Date   TSH 1.77 11/13/2018  Physical Examination:  BP 124/84 (BP Location: Left Arm, Patient Position: Sitting, Cuff Size: Normal)   Pulse 83   Ht 5' 11"  (1.803 m)   Wt 146 lb 6.4 oz (66.4 kg)   SpO2 98%   BMI 20.42 kg/m       ASSESSMENT:  Diabetes type 2, recent BMI 22  See history of present illness for discussion of his current management, blood sugar patterns and problems identified  His blood sugars are significantly higher now Fructosamine indicates increased hyperglycemia recently  This is likely to be from his appetite returning and he is eating overall more portions as well as carbohydrates including some sweets Likely he is insulin deficient again and gaining back some weight that he had lost Not well controlled with Tradjenta and low-dose Prandin which he is only taking at lunchtime Blood sugars after lunch may be over 300     PLAN:   Start back on mealtime insulin His wife was present to understand the need for insulin and how  to do this Today discussed in detail the need for mealtime insulin to cover postprandial spikes, action of mealtime insulin, use of the insulin pen, timing and action of the rapid acting insulin as well as starting dose and dosage titration to target the two-hour reading of under 180 We will start with 4 units at breakfast and lunch and 6 units at dinner Discussed that if he is eating carbohydrates like bagels at breakfast he will need NovoLog at that time also He wants to get this from the [facility] Also discussed in detail that he needs to check readings more consistently after all the meals by rotation and to continue gradually increasing the dose by about 2 units for the NovoLog to keep his postprandial readings at least under 180 Stop Prandin Stay on Tradjenta as this is helping fasting readings   Patient Instructions  Check blood sugars on waking up days a week  Also check blood sugars about 2 hours after meals and do this after different meals by rotation  Recommended blood sugar levels on waking up are 90-130 and about 2 hours after meal is 130-10  Please bring your blood sugar monitor to each visit, thank you  Novolog 4 units at Bfst and lunch and 6 at dinner  Repeglanide take 2 at [facility] and dinner until u get Novolog then stop    Counseling time on subjects discussed in assessment and plan sections is over 50% of today's 25 minute visit      Elayne Snare 11/25/2018, 2:32 PM   Note: This office note was prepared with Dragon voice recognition system technology. Any transcriptional errors that result from this process are unintentional.

## 2018-11-25 NOTE — Telephone Encounter (Signed)
No problem, I remember the request and not sure why the approval isnt clearly documented, but I'm happy to see Mr. Sciascia in f/u. Thanks.

## 2018-11-25 NOTE — Telephone Encounter (Signed)
Patient has been scheduled with Dr. Bryan Lemma on 12/30/2018 at 1:30pm; patient is aware of appt date/time;

## 2018-11-25 NOTE — Patient Instructions (Addendum)
Check blood sugars on waking up days a week  Also check blood sugars about 2 hours after meals and do this after different meals by rotation  Recommended blood sugar levels on waking up are 90-130 and about 2 hours after meal is 130-10  Please bring your blood sugar monitor to each visit, thank you  Novolog 4 units at Bfst and lunch and 6 at dinner  Repeglanide take 2 at Lunch and dinner until u get Novolog then stop

## 2018-11-26 ENCOUNTER — Telehealth: Payer: Self-pay

## 2018-11-26 ENCOUNTER — Other Ambulatory Visit: Payer: Self-pay | Admitting: Neurological Surgery

## 2018-11-26 ENCOUNTER — Telehealth: Payer: Self-pay | Admitting: Endocrinology

## 2018-11-26 DIAGNOSIS — S065X9A Traumatic subdural hemorrhage with loss of consciousness of unspecified duration, initial encounter: Secondary | ICD-10-CM

## 2018-11-26 DIAGNOSIS — S065XAA Traumatic subdural hemorrhage with loss of consciousness status unknown, initial encounter: Secondary | ICD-10-CM

## 2018-11-26 DIAGNOSIS — E1129 Type 2 diabetes mellitus with other diabetic kidney complication: Secondary | ICD-10-CM | POA: Diagnosis not present

## 2018-11-26 DIAGNOSIS — N2581 Secondary hyperparathyroidism of renal origin: Secondary | ICD-10-CM | POA: Diagnosis not present

## 2018-11-26 DIAGNOSIS — N186 End stage renal disease: Secondary | ICD-10-CM | POA: Diagnosis not present

## 2018-11-26 DIAGNOSIS — D631 Anemia in chronic kidney disease: Secondary | ICD-10-CM | POA: Diagnosis not present

## 2018-11-26 DIAGNOSIS — D509 Iron deficiency anemia, unspecified: Secondary | ICD-10-CM | POA: Diagnosis not present

## 2018-11-26 NOTE — Telephone Encounter (Signed)
Patients wife is calling stating she is trying to order RX's from the [place] and states the prescription Novolog, summary note, needles need to be faxed over.  Attn: Dr. Theda Sers Fax # [phone]  Cell # [phone]

## 2018-11-26 NOTE — Telephone Encounter (Signed)
Spoke with pt's wife and was informed that Rx was faxed yesterday to [place], and I would fax this to Dr. Enrique Sack office as well for their records. [name] verbalized understanding.

## 2018-11-26 NOTE — Telephone Encounter (Signed)
Trenese from [place] called to request verbal orders for wound check twice a week for 2 weeks.

## 2018-11-27 ENCOUNTER — Encounter: Payer: Medicare Other | Attending: Physician Assistant | Admitting: Physician Assistant

## 2018-11-27 ENCOUNTER — Ambulatory Visit (INDEPENDENT_AMBULATORY_CARE_PROVIDER_SITE_OTHER): Payer: Medicare Other | Admitting: Internal Medicine

## 2018-11-27 ENCOUNTER — Encounter: Payer: Self-pay | Admitting: Internal Medicine

## 2018-11-27 VITALS — BP 110/80 | HR 65 | Temp 98.5°F | Ht 71.0 in | Wt 147.0 lb

## 2018-11-27 DIAGNOSIS — I10 Essential (primary) hypertension: Secondary | ICD-10-CM | POA: Diagnosis not present

## 2018-11-27 DIAGNOSIS — R413 Other amnesia: Secondary | ICD-10-CM

## 2018-11-27 DIAGNOSIS — E1122 Type 2 diabetes mellitus with diabetic chronic kidney disease: Secondary | ICD-10-CM | POA: Diagnosis not present

## 2018-11-27 DIAGNOSIS — Z952 Presence of prosthetic heart valve: Secondary | ICD-10-CM | POA: Diagnosis not present

## 2018-11-27 DIAGNOSIS — L84 Corns and callosities: Secondary | ICD-10-CM | POA: Diagnosis not present

## 2018-11-27 DIAGNOSIS — Z8631 Personal history of diabetic foot ulcer: Secondary | ICD-10-CM | POA: Insufficient documentation

## 2018-11-27 DIAGNOSIS — S065X9A Traumatic subdural hemorrhage with loss of consciousness of unspecified duration, initial encounter: Secondary | ICD-10-CM | POA: Diagnosis not present

## 2018-11-27 DIAGNOSIS — M549 Dorsalgia, unspecified: Secondary | ICD-10-CM

## 2018-11-27 DIAGNOSIS — N186 End stage renal disease: Secondary | ICD-10-CM | POA: Diagnosis not present

## 2018-11-27 DIAGNOSIS — Z992 Dependence on renal dialysis: Secondary | ICD-10-CM | POA: Diagnosis not present

## 2018-11-27 DIAGNOSIS — Z09 Encounter for follow-up examination after completed treatment for conditions other than malignant neoplasm: Secondary | ICD-10-CM | POA: Diagnosis not present

## 2018-11-27 DIAGNOSIS — K5520 Angiodysplasia of colon without hemorrhage: Secondary | ICD-10-CM | POA: Diagnosis not present

## 2018-11-27 DIAGNOSIS — E1151 Type 2 diabetes mellitus with diabetic peripheral angiopathy without gangrene: Secondary | ICD-10-CM | POA: Diagnosis not present

## 2018-11-27 DIAGNOSIS — K922 Gastrointestinal hemorrhage, unspecified: Secondary | ICD-10-CM | POA: Diagnosis not present

## 2018-11-27 DIAGNOSIS — S065XAA Traumatic subdural hemorrhage with loss of consciousness status unknown, initial encounter: Secondary | ICD-10-CM

## 2018-11-27 DIAGNOSIS — K31819 Angiodysplasia of stomach and duodenum without bleeding: Secondary | ICD-10-CM

## 2018-11-27 DIAGNOSIS — I120 Hypertensive chronic kidney disease with stage 5 chronic kidney disease or end stage renal disease: Secondary | ICD-10-CM | POA: Insufficient documentation

## 2018-11-27 DIAGNOSIS — E119 Type 2 diabetes mellitus without complications: Secondary | ICD-10-CM | POA: Diagnosis not present

## 2018-11-27 DIAGNOSIS — E1140 Type 2 diabetes mellitus with diabetic neuropathy, unspecified: Secondary | ICD-10-CM | POA: Diagnosis not present

## 2018-11-27 DIAGNOSIS — Z87891 Personal history of nicotine dependence: Secondary | ICD-10-CM | POA: Insufficient documentation

## 2018-11-27 DIAGNOSIS — L98499 Non-pressure chronic ulcer of skin of other sites with unspecified severity: Secondary | ICD-10-CM | POA: Diagnosis not present

## 2018-11-27 LAB — CBC WITH DIFFERENTIAL/PLATELET
Absolute Monocytes: 809 cells/uL (ref 200–950)
Basophils Absolute: 63 cells/uL (ref 0–200)
Basophils Relative: 0.6 %
Eosinophils Absolute: 336 cells/uL (ref 15–500)
Eosinophils Relative: 3.2 %
HCT: 34.6 % — ABNORMAL LOW (ref 38.5–50.0)
Hemoglobin: 11.1 g/dL — ABNORMAL LOW (ref 13.2–17.1)
Lymphs Abs: 1869 cells/uL (ref 850–3900)
MCH: 31.8 pg (ref 27.0–33.0)
MCHC: 32.1 g/dL (ref 32.0–36.0)
MCV: 99.1 fL (ref 80.0–100.0)
MPV: 10.1 fL (ref 7.5–12.5)
Monocytes Relative: 7.7 %
Neutro Abs: 7424 cells/uL (ref 1500–7800)
Neutrophils Relative %: 70.7 %
Platelets: 207 10*3/uL (ref 140–400)
RBC: 3.49 10*6/uL — ABNORMAL LOW (ref 4.20–5.80)
RDW: 17 % — ABNORMAL HIGH (ref 11.0–15.0)
Total Lymphocyte: 17.8 %
WBC: 10.5 10*3/uL (ref 3.8–10.8)

## 2018-11-27 NOTE — Telephone Encounter (Signed)
Unable to leave message

## 2018-11-27 NOTE — Telephone Encounter (Signed)
Go ahead and say [name] to this

## 2018-11-28 DIAGNOSIS — N186 End stage renal disease: Secondary | ICD-10-CM | POA: Diagnosis not present

## 2018-11-28 DIAGNOSIS — N2581 Secondary hyperparathyroidism of renal origin: Secondary | ICD-10-CM | POA: Diagnosis not present

## 2018-11-28 DIAGNOSIS — D631 Anemia in chronic kidney disease: Secondary | ICD-10-CM | POA: Diagnosis not present

## 2018-11-28 DIAGNOSIS — D509 Iron deficiency anemia, unspecified: Secondary | ICD-10-CM | POA: Diagnosis not present

## 2018-11-28 DIAGNOSIS — E1129 Type 2 diabetes mellitus with other diabetic kidney complication: Secondary | ICD-10-CM | POA: Diagnosis not present

## 2018-11-30 DIAGNOSIS — F0390 Unspecified dementia without behavioral disturbance: Secondary | ICD-10-CM | POA: Diagnosis not present

## 2018-11-30 DIAGNOSIS — N186 End stage renal disease: Secondary | ICD-10-CM | POA: Diagnosis not present

## 2018-11-30 DIAGNOSIS — D509 Iron deficiency anemia, unspecified: Secondary | ICD-10-CM | POA: Diagnosis not present

## 2018-11-30 NOTE — Progress Notes (Addendum)
CHADD, TOLLISON ([id number]) Visit Report for 11/27/2018 Arrival Information Details Patient Name: [name], [name]. Date of Service: 11/27/2018 2:00 PM Medical Record Number: [id number] Patient Account Number: [id number] Date of Birth/Sex: 12/12/1940 (77 y.o. M) Treating RN: [name] Primary Care [name]: [name] Other Clinician: Referring [name]: [name] Treating [name]/Extender: [name], [name] Weeks in Treatment: 11 Visit Information History Since Last Visit Added or deleted any medications: No Patient Arrived: [name] Any new allergies or adverse reactions: No Arrival Time: 14:11 Had a fall or experienced change in No Accompanied By: self activities of daily living that may affect Transfer Assistance: None risk of falls: Patient Identification Verified: Yes Signs or symptoms of abuse/neglect since last visito No Secondary Verification Process Completed: Yes Hospitalized since last visit: Yes Patient Requires Transmission-Based Precautions: No Pain Present Now: No Patient Has Alerts: No Electronic Signature(s) Signed: 11/28/2018 3:21:09 PM By: [name] Entered By: [name] on 11/27/2018 14:14:42 Barrette, [name] ([id number]) -------------------------------------------------------------------------------- Clinic Level of Care Assessment Details Patient Name: [name]. Date of Service: 11/27/2018 2:00 PM Medical Record Number: [id number] Patient Account Number: [id number] Date of Birth/Sex: 04-21-1941 (77 y.o. M) Treating RN: [name] Primary Care Nithya Meriweather: [name] Other Clinician: Referring Armando [name]: [name] Treating [name]/Extender: [name], [name] Weeks in Treatment: 11 Clinic Level of Care Assessment Items TOOL 4 Quantity Score X - Use when only an EandM is performed on FOLLOW-UP visit 1 0 ASSESSMENTS - Nursing Assessment / Reassessment X - Reassessment of Co-morbidities (includes updates in patient status) 1 10 X- 1  5 Reassessment of Adherence to Treatment Plan ASSESSMENTS - Wound and Skin Assessment / Reassessment X - Simple Wound Assessment / Reassessment - one wound 1 5 []  - 0 Complex Wound Assessment / Reassessment - multiple wounds []  - 0 Dermatologic / Skin Assessment (not related to wound area) ASSESSMENTS - Focused Assessment []  - Circumferential Edema Measurements - multi extremities 0 []  - 0 Nutritional Assessment / Counseling / Intervention []  - 0 Lower Extremity Assessment (monofilament, tuning fork, pulses) []  - 0 Peripheral Arterial Disease Assessment (using hand held doppler) ASSESSMENTS - Ostomy and/or Continence Assessment and Care []  - Incontinence Assessment and Management 0 []  - 0 Ostomy Care Assessment and Management (repouching, etc.) PROCESS - Coordination of Care X - Simple Patient / Family Education for ongoing care 1 15 []  - 0 Complex (extensive) Patient / Family Education for ongoing care []  - 0 Staff obtains Programmer, systems, Records, Test Results / Process Orders []  - 0 Staff telephones HHA, Nursing Homes / Clarify orders / etc []  - 0 Routine Transfer to another Facility (non-emergent condition) []  - 0 Routine Hospital Admission (non-emergent condition) []  - 0 New Admissions / Biomedical engineer / Ordering NPWT, Apligraf, etc. []  - 0 Emergency Hospital Admission (emergent condition) X- 1 10 Simple Discharge Coordination [name], [name] ([id number]) []  - 0 Complex (extensive) Discharge Coordination PROCESS - Special Needs []  - Pediatric / Minor Patient Management 0 []  - 0 Isolation Patient Management []  - 0 Hearing / Language / Visual special needs []  - 0 Assessment of Community assistance (transportation, D/C planning, etc.) []  - 0 Additional assistance / Altered mentation []  - 0 Support Surface(s) Assessment (bed, cushion, seat, etc.) INTERVENTIONS - Wound Cleansing / Measurement X - Simple Wound Cleansing - one wound 1 5 []  - 0 Complex Wound  Cleansing - multiple wounds X- 1 5 Wound Imaging (photographs - any number of wounds) []  - 0 Wound Tracing (instead of photographs) X- 1  5 Simple Wound Measurement - one wound []  - 0 Complex Wound Measurement - multiple wounds INTERVENTIONS - Wound Dressings X - Small Wound Dressing one or multiple wounds 1 10 []  - 0 Medium Wound Dressing one or multiple wounds []  - 0 Large Wound Dressing one or multiple wounds []  - 0 Application of Medications - topical []  - 0 Application of Medications - injection INTERVENTIONS - Miscellaneous []  - External ear exam 0 []  - 0 Specimen Collection (cultures, biopsies, blood, body fluids, etc.) []  - 0 Specimen(s) / Culture(s) sent or taken to Lab for analysis []  - 0 Patient Transfer (multiple staff / Civil Service fast streamer / Similar devices) []  - 0 Simple Staple / Suture removal (25 or less) []  - 0 Complex Staple / Suture removal (26 or more) []  - 0 Hypo / Hyperglycemic Management (close monitor of Blood Glucose) []  - 0 Ankle / Brachial Index (ABI) - do not check if billed separately X- 1 5 Vital Signs Sharpe, [name] ([id number]) Has the patient been seen at the hospital within the last three years: Yes Total Score: 75 Level Of Care: New/Established - Level 2 Electronic Signature(s) Signed: 11/28/2018 3:17:07 PM By: [name] Entered By: [name] on 11/27/2018 14:47:22 Weatherspoon, [name] ([id number]) -------------------------------------------------------------------------------- Lower Extremity Assessment Details Patient Name: [name]. Date of Service: 11/27/2018 2:00 PM Medical Record Number: [id number] Patient Account Number: [id number] Date of Birth/Sex: May 09, 1941 (77 y.o. M) Treating RN: [name] Primary Care Martasia Talamante: [name] Other Clinician: Referring Walta [place]: [name] Treating Ayleah Hofmeister/Extender: [name], [name] Weeks in Treatment: 11 Electronic Signature(s) Signed: 11/28/2018 3:21:09 PM By: [name] Entered By: [name] on 11/27/2018 14:20:16 Ragas, [name] ([id number]) -------------------------------------------------------------------------------- Multi Wound Chart Details Patient Name: [name]. Date of Service: 11/27/2018 2:00 PM Medical Record Number: [id number] Patient Account Number: [id number] Date of Birth/Sex: [birth year] (77 y.o. M) Treating RN: [name] Primary Care Saba Neuman: [name] Other Clinician: Referring Field Staniszewski: [name] Treating Havoc Sanluis/Extender: [name], [name] Weeks in Treatment: 11 Vital Signs Height(in): 71 Pulse(bpm): 70 Weight(lbs): 146 Blood Pressure(mmHg): 117/47 Body Mass Index(BMI): 20 Temperature(F): 97.9 Respiratory Rate 18 (breaths/min): Wound Assessments Treatment Notes Electronic Signature(s) Signed: 11/28/2018 3:17:07 PM By: [name] Entered By: [name] on 11/27/2018 14:45:37 Polack, [name] ([id number]) -------------------------------------------------------------------------------- Multi-Disciplinary Care Plan Details Patient Name: [name]. Date of Service: 11/27/2018 2:00 PM Medical Record Number: [id number] Patient Account Number: [id number] Date of Birth/Sex: 06-28-1941 (77 y.o. M) Treating RN: [name] Primary Care Terrye Dombrosky: [name] Other Clinician: Referring Danyale Ridinger: [name] Treating Herchel Hopkin/Extender: [name], [name] Weeks in Treatment: 11 Active Inactive Electronic Signature(s) Signed: 11/28/2018 3:17:07 PM By: [name] Entered By: [name] on 11/27/2018 14:45:28 Apsey, [name] ([id number]) -------------------------------------------------------------------------------- Pain Assessment Details Patient Name: [name]. Date of Service: 11/27/2018 2:00 PM Medical Record Number: [id number] Patient Account Number: [id number] Date of Birth/Sex: Jun 09, 1941 (77 y.o. M) Treating RN: [name] Primary Care Teresha Hanks: [name] Other  Clinician: Referring Oak Dorey: [name] Treating Quantavis Obryant/Extender: [name], [name] Weeks in Treatment: 11 Active Problems Location of Pain Severity and Description of Pain Patient Has Paino No Site Locations Pain Management and Medication Current Pain Management: Electronic Signature(s) Signed: 11/28/2018 3:21:09 PM By: [name] Entered By: [name] on 11/27/2018 14:14:58 Dimarzo, [name] ([id number]) -------------------------------------------------------------------------------- Patient/Caregiver Education Details Patient Name: [name]. Date of Service: 11/27/2018 2:00 PM Medical Record Number: [id number] Patient Account Number: [id number] Date of Birth/Gender: May 05, 1941 (77 y.o. M) Treating RN: [name]  Primary Care Physician: [name] Other Clinician: Referring Physician: Tedra [name] Treating Physician/Extender: Sharalyn Ink in Treatment: 11 Education Assessment Education Provided To: Patient Education Topics Provided Wound/Skin Impairment: Handouts: Caring for Your Ulcer Methods: Demonstration, Explain/Verbal Responses: State content correctly Electronic Signature(s) Signed: 11/28/2018 3:17:07 PM By: [name] Entered By: [name] on 11/27/2018 14:47:37 Puccini, [name] ([id number]) -------------------------------------------------------------------------------- [place] Details Patient Name: [name]. Date of Service: 11/27/2018 2:00 PM Medical Record Number: [id number] Patient Account Number: [id number] Date of Birth/Sex: 12-10-1940 (77 y.o. M) Treating RN: [name] Primary Care Jayleon Mcfarlane: [name] Other Clinician: Referring Kendyn Zaman: [name] Treating Atonya Templer/Extender: [name], [name] Weeks in Treatment: 11 Vital Signs Time Taken: 14:15 Temperature (F): 97.9 Height (in): 71 Pulse (bpm): 70 Weight (lbs): 146 Respiratory Rate (breaths/min): 18 Body Mass Index (BMI): 20.4 Blood Pressure (mmHg):  117/47 Reference Range: 80 - 120 mg / dl Electronic Signature(s) Signed: 11/28/2018 3:21:09 PM By: [name] Entered By: [name] on 11/27/2018 14:20:08

## 2018-12-01 ENCOUNTER — Other Ambulatory Visit: Payer: Self-pay | Admitting: Nurse Practitioner

## 2018-12-01 DIAGNOSIS — D509 Iron deficiency anemia, unspecified: Secondary | ICD-10-CM | POA: Diagnosis not present

## 2018-12-01 DIAGNOSIS — N2581 Secondary hyperparathyroidism of renal origin: Secondary | ICD-10-CM | POA: Diagnosis not present

## 2018-12-01 DIAGNOSIS — E1129 Type 2 diabetes mellitus with other diabetic kidney complication: Secondary | ICD-10-CM | POA: Diagnosis not present

## 2018-12-01 DIAGNOSIS — N186 End stage renal disease: Secondary | ICD-10-CM | POA: Diagnosis not present

## 2018-12-01 DIAGNOSIS — D631 Anemia in chronic kidney disease: Secondary | ICD-10-CM | POA: Diagnosis not present

## 2018-12-01 NOTE — Progress Notes (Signed)
ADEOLUWA, SILVERS ([id number]) Visit Report for 10/23/2018 Arrival Information Details Patient Name: [name], [name]. Date of Service: 10/23/2018 10:00 AM Medical Record Number: [id number] Patient Account Number: [id number] Date of Birth/Sex: 1941-08-22 (77 y.o. M) Treating RN: Harold Barban Primary Care Asyia Hornung: Tedra [place] Other Clinician: Referring Garrison Michie: Tedra [place] Treating Lakethia Coppess/Extender: [name], HOYT Weeks in Treatment: 6 Visit Information History Since Last Visit Added or deleted any medications: No Patient Arrived: Walker Any new allergies or adverse reactions: No Arrival Time: 10:10 Had a fall or experienced change in No Accompanied By: self activities of daily living that may affect Transfer Assistance: None risk of falls: Patient Identification Verified: Yes Signs or symptoms of abuse/neglect since last visito No Secondary Verification Process Completed: Yes Hospitalized since last visit: No Patient Requires Transmission-Based Precautions: No Implantable device outside of the clinic excluding No Patient Has Alerts: No cellular tissue based products placed in the center since last visit: Has Dressing in Place as Prescribed: Yes Pain Present Now: No Electronic Signature(s) Signed: 10/23/2018 11:23:59 AM By: Lorine Bears RCP, RRT, CHT Entered By: Lorine Bears on 10/23/2018 10:11:22 [name], [name] ([id number]) -------------------------------------------------------------------------------- Encounter Discharge Information Details Patient Name: [name]. Date of Service: 10/23/2018 10:00 AM Medical Record Number: [id number] Patient Account Number: [id number] Date of Birth/Sex: [birth year] (77 y.o. M) Treating RN: Harold Barban Primary Care Ming Kunka: Tedra [place] Other Clinician: Referring Linell Meldrum: Tedra [place] Treating Ezreal Turay/Extender: [name], HOYT Weeks in Treatment: 6 Encounter Discharge Information Items Post  Procedure Vitals Discharge Condition: Stable Temperature (F): 97.7 Ambulatory Status: Walker Pulse (bpm): 79 Discharge Destination: Home Respiratory Rate (breaths/min): 16 Transportation: Private Auto Blood Pressure (mmHg): 104/68 Accompanied By: self Schedule Follow-up Appointment: Yes Clinical Summary of Care: Electronic Signature(s) Signed: 12/01/2018 10:53:34 AM By: Harold Barban Entered By: Harold Barban on 10/23/2018 10:46:39 [place]ord, [name] ([id number]) -------------------------------------------------------------------------------- Lower Extremity Assessment Details Patient Name: [name]. Date of Service: 10/23/2018 10:00 AM Medical Record Number: [id number] Patient Account Number: [id number] Date of Birth/Sex: 06/01/1941 (77 y.o. M) Treating RN: Montey Hora Primary Care Parv Manthey: Tedra [place] Other Clinician: Referring Rebel Willcutt: Tedra [place] Treating Ketsia Linebaugh/Extender: [name], HOYT Weeks in Treatment: 6 Electronic Signature(s) Signed: 10/23/2018 5:03:44 PM By: Montey Hora Entered By: Montey Hora on 10/23/2018 10:22:19 [name], [name] ([id number]) -------------------------------------------------------------------------------- Multi Wound Chart Details Patient Name: [name]. Date of Service: 10/23/2018 10:00 AM Medical Record Number: [id number] Patient Account Number: [id number] Date of Birth/Sex: 05/27/1941 (77 y.o. M) Treating RN: Harold Barban Primary Care Winston Sobczyk: Tedra [place] Other Clinician: Referring Tiaja Hagan: Tedra [place] Treating Rieley Khalsa/Extender: [name], HOYT Weeks in Treatment: 6 Vital Signs Height(in): 71 Pulse(bpm): 79 Weight(lbs): 146 Blood Pressure(mmHg): 104/68 Body Mass Index(BMI): 20 Temperature(F): 97.7 Respiratory Rate 16 (breaths/min): Photos: [6:No Photos] [N/A:N/A] Wound Location: [6:Right Abdomen - Lower Quadrant] [N/A:N/A] Wounding Event: [6:Blister] [N/A:N/A] Primary Etiology: [6:Abscess]  [N/A:N/A] Comorbid History: [6:Anemia, Hypertension, Peripheral Venous Disease, Type II Diabetes, End Stage Renal Disease, Gout, Neuropathy] [N/A:N/A] Date Acquired: [6:08/28/2018] [N/A:N/A] Weeks of Treatment: [6:6] [N/A:N/A] Wound Status: [6:Open] [N/A:N/A] Measurements L x W x D [6:1x0.7x0.1] [N/A:N/A] (cm) Area (cm) : [6:0.55] [N/A:N/A] Volume (cm) : [6:0.055] [N/A:N/A] % Reduction in Area: [6:98.70%] [N/A:N/A] % Reduction in Volume: [6:98.70%] [N/A:N/A] Classification: [6:Full Thickness Without Exposed Support Structures] [N/A:N/A] Exudate Amount: [6:Small] [N/A:N/A] Exudate Type: [6:Serous] [N/A:N/A] Exudate Color: [6:amber] [N/A:N/A] Wound Margin: [6:Flat and Intact] [N/A:N/A] Granulation Amount: [6:Large (67-100%)] [N/A:N/A] Granulation Quality: [6:Pink] [N/A:N/A] Necrotic Amount: [6:Small (1-33%)] [N/A:N/A] Exposed Structures: [6:Fat Layer (Subcutaneous Tissue) Exposed: Yes Fascia: No  Tendon: No Muscle: No Joint: No Bone: No] [N/A:N/A] Epithelialization: [6:Medium (34-66%)] [N/A:N/A] Periwound Skin Texture: [N/A:N/A] Scarring: Yes Excoriation: No Induration: No Callus: No Crepitus: No Rash: No Periwound Skin Moisture: Maceration: No N/A N/A Dry/Scaly: No Periwound Skin Color: Atrophie Blanche: No N/A N/A Cyanosis: No Ecchymosis: No Erythema: No Hemosiderin Staining: No Mottled: No Pallor: No Rubor: No Temperature: No Abnormality N/A N/A Tenderness on Palpation: Yes N/A N/A Wound Preparation: Ulcer Cleansing: N/A N/A Rinsed/Irrigated with Saline Topical Anesthetic Applied: Other: lidocaine 4% Treatment Notes Electronic Signature(s) Signed: 12/01/2018 10:53:34 AM By: Harold Barban Entered By: Harold Barban on 10/23/2018 10:38:23 [name], [name] ([id number]) -------------------------------------------------------------------------------- [place] Details Patient Name: [name]. Date of Service: 10/23/2018 10:00 AM Medical  Record Number: [id number] Patient Account Number: [id number] Date of Birth/Sex: February 28, 1941 (77 y.o. M) Treating RN: Harold Barban Primary Care Andrez Lieurance: Tedra Senegal Other Clinician: Referring Joseph Bias: Tedra Senegal Treating Ellah Otte/Extender: [name], HOYT Weeks in Treatment: 6 Active Inactive Abuse / Safety / Falls / Self Care Management Nursing Diagnoses: Impaired physical mobility Goals: Patient will remain injury free related to falls Date Initiated: 09/11/2018 Target Resolution Date: 11/29/2018 Goal Status: Active Interventions: Assess fall risk on admission and as needed Notes: Nutrition Nursing Diagnoses: Potential for alteratiion in Nutrition/Potential for imbalanced nutrition Goals: Patient/caregiver agrees to and verbalizes understanding of need to use nutritional supplements and/or vitamins as prescribed Date Initiated: 09/11/2018 Target Resolution Date: 11/29/2018 Goal Status: Active Interventions: Assess patient nutrition upon admission and as needed per policy Notes: Orientation to the Wound Care Program Nursing Diagnoses: Knowledge deficit related to the wound healing center program Goals: Patient/caregiver will verbalize understanding of the [facility] Date Initiated: 09/11/2018 Target Resolution Date: 11/29/2018 Goal Status: Active Interventions: Provide education on orientation to the wound center [name], [name] ([id number]) Notes: Wound/Skin Impairment Nursing Diagnoses: Impaired tissue integrity Goals: Ulcer/skin breakdown will heal within 14 weeks Date Initiated: 09/11/2018 Target Resolution Date: 11/29/2018 Goal Status: Active Interventions: Assess patient/caregiver ability to obtain necessary supplies Assess patient/caregiver ability to perform ulcer/skin care regimen upon admission and as needed Assess ulceration(s) every visit Notes: Electronic Signature(s) Signed: 12/01/2018 10:53:34 AM By: Harold Barban Entered By:  Harold Barban on 10/23/2018 10:38:15 [name], [name] ([id number]) -------------------------------------------------------------------------------- Pain Assessment Details Patient Name: [name]. Date of Service: 10/23/2018 10:00 AM Medical Record Number: [id number] Patient Account Number: [id number] Date of Birth/Sex: [birth year] (77 y.o. M) Treating RN: Harold Barban Primary Care Durant Scibilia: Tedra Senegal Other Clinician: Referring Miloh Alcocer: Tedra [place] Treating Rylea Selway/Extender: [name], HOYT Weeks in Treatment: 6 Active Problems Location of Pain Severity and Description of Pain Patient Has Paino No Site Locations Pain Management and Medication Current Pain Management: Electronic Signature(s) Signed: 10/23/2018 11:23:59 AM By: Lorine Bears RCP, RRT, CHT Signed: 12/01/2018 10:53:34 AM By: Harold Barban Entered By: Lorine Bears on 10/23/2018 10:11:28 [name], [name] ([id number]) -------------------------------------------------------------------------------- Patient/Caregiver Education Details Patient Name: [name]. Date of Service: 10/23/2018 10:00 AM Medical Record Number: [id number] Patient Account Number: [id number] Date of Birth/Gender: [birth year] (77 y.o. M) Treating RN: Harold Barban Primary Care Physician: Tedra Senegal Other Clinician: Referring Physician: Tedra [place]egal Treating Physician/Extender: Sharalyn Ink in Treatment: 6 Education Assessment Education Provided To: Patient Education Topics Provided Wound/Skin Impairment: Handouts: Caring for Your Ulcer Methods: Demonstration, Explain/Verbal Responses: State content correctly Electronic Signature(s) Signed: 12/01/2018 10:53:34 AM By: Harold Barban Entered By: Harold Barban on 10/23/2018 10:46:44 [name], [name] ([id number]) -------------------------------------------------------------------------------- Wound Assessment Details Patient Name: [name].  Date of Service: 10/23/2018 10:00 AM Medical Record Number: [id number] Patient Account Number: [id number] Date of Birth/Sex: [birth year] (77 y.o. M) Treating RN: Montey Hora Primary Care Jeny Nield: Tedra [place] Other Clinician: Referring Marlean Mortell: Tedra [place] Treating Darria Corvera/Extender: [name], HOYT Weeks in Treatment: 6 Wound Status Wound Number: 6 Primary Abscess Etiology: Wound Location: Right Abdomen - Lower Quadrant Wound Open Wounding Event: Blister Status: Date Acquired: 08/28/2018 Comorbid Anemia, Hypertension, Peripheral Venous Weeks Of Treatment: 6 History: Disease, Type II Diabetes, End Stage Renal Clustered Wound: No Disease, Gout, Neuropathy Photos Photo Uploaded By: Montey Hora on 10/23/2018 11:25:31 Wound Measurements Length: (cm) 1 Width: (cm) 0.7 Depth: (cm) 0.1 Area: (cm) 0.55 Volume: (cm) 0.055 % Reduction in Area: 98.7% % Reduction in Volume: 98.7% Epithelialization: Medium (34-66%) Tunneling: No Undermining: No Wound Description Full Thickness Without Exposed Support Classification: Structures Wound Margin: Flat and Intact Exudate Small Amount: Exudate Type: Serous Exudate Color: amber Foul Odor After Cleansing: No Slough/Fibrino Yes Wound Bed Granulation Amount: Large (67-100%) Exposed Structure Granulation Quality: Pink Fascia Exposed: No Necrotic Amount: Small (1-33%) Fat Layer (Subcutaneous Tissue) Exposed: Yes Necrotic Quality: Adherent Slough Tendon Exposed: No Muscle Exposed: No Joint Exposed: No Bone Exposed: No [name], [name] ([id number]) Periwound Skin Texture Texture Color No Abnormalities Noted: No No Abnormalities Noted: No Callus: No Atrophie Blanche: No Crepitus: No Cyanosis: No Excoriation: No Ecchymosis: No Induration: No Erythema: No Rash: No Hemosiderin Staining: No Scarring: Yes Mottled: No Pallor: No Moisture Rubor: No No Abnormalities Noted: No Dry / Scaly: No Temperature /  Pain Maceration: No Temperature: No Abnormality Tenderness on Palpation: Yes Wound Preparation Ulcer Cleansing: Rinsed/Irrigated with Saline Topical Anesthetic Applied: Other: lidocaine 4%, Electronic Signature(s) Signed: 10/23/2018 5:03:44 PM By: Montey Hora Entered By: Montey Hora on 10/23/2018 10:22:07 [name], [name] ([id number]) -------------------------------------------------------------------------------- Vitals Details Patient Name: [name]. Date of Service: 10/23/2018 10:00 AM Medical Record Number: [id number] Patient Account Number: [id number] Date of Birth/Sex: August 18, 1941 (77 y.o. M) Treating RN: Harold Barban Primary Care Chinonso Linker: Tedra Senegal Other Clinician: Referring Danaysia Rader: Tedra [place]egal Treating Jaydee Ingman/Extender: [name], HOYT Weeks in Treatment: 6 Vital Signs Time Taken: 10:11 Temperature (F): 97.7 Height (in): 71 Pulse (bpm): 79 Weight (lbs): 146 Respiratory Rate (breaths/min): 16 Body Mass Index (BMI): 20.4 Blood Pressure (mmHg): 104/68 Reference Range: 80 - 120 mg / dl Electronic Signature(s) Signed: 10/23/2018 11:23:59 AM By: Lorine Bears RCP, RRT, CHT Entered By: Lorine Bears on 10/23/2018 10:18:49

## 2018-12-01 NOTE — Progress Notes (Signed)
[name] ([id number]) Visit Report for 10/23/2018 Chief Complaint Document Details Patient Name: [name], [name]. Date of Service: 10/23/2018 10:00 AM Medical Record Number: [id number] Patient Account Number: [id number] Date of Birth/Sex: 1941/01/08 (77 y.o. M) Treating RN: Harold Barban Primary Care Provider: Tedra Senegal Other Clinician: Referring Provider: Tedra Senegal Treating Provider/Extender: [name], HOYT Weeks in Treatment: 6 Information Obtained from: Patient Chief Complaint Abdominal ulcers Electronic Signature(s) Signed: 10/23/2018 9:52:35 PM By: Worthy Keeler PA-C Entered By: Worthy Keeler on 10/23/2018 10:08:03 [name], [name] ([id number]) -------------------------------------------------------------------------------- Debridement Details Patient Name: [name]. Date of Service: 10/23/2018 10:00 AM Medical Record Number: [id number] Patient Account Number: [id number] Date of Birth/Sex: 07-21-1941 (77 y.o. M) Treating RN: Harold Barban Primary Care Provider: Tedra Senegal Other Clinician: Referring Provider: Tedra Senegal Treating Provider/Extender: [name], HOYT Weeks in Treatment: 6 Debridement Performed for Wound #6 Right Abdomen - Lower Quadrant Assessment: Performed By: Physician STONE III, HOYT E., PA-C Debridement Type: Debridement Level of Consciousness (Pre- Awake and Alert procedure): Pre-procedure Verification/Time Yes - 10:39 Out Taken: Start Time: 10:39 Pain Control: Lidocaine Total Area Debrided (L x W): 1 (cm) x 0.7 (cm) = 0.7 (cm) Tissue and other material Viable, Non-Viable, Slough, Subcutaneous, Slough debrided: Level: Skin/Subcutaneous Tissue Debridement Description: Excisional Instrument: Curette Bleeding: Minimum Hemostasis Achieved: Pressure End Time: 10:42 Procedural Pain: 0 Post Procedural Pain: 0 Response to Treatment: Procedure was tolerated well Level of Consciousness Awake and  Alert (Post-procedure): Post Debridement Measurements of Total Wound Length: (cm) 1 Width: (cm) 0.7 Depth: (cm) 0.1 Volume: (cm) 0.055 Character of Wound/Ulcer Post Debridement: Improved Post Procedure Diagnosis Same as Pre-procedure Electronic Signature(s) Signed: 10/23/2018 9:52:35 PM By: Worthy Keeler PA-C Signed: 12/01/2018 10:53:34 AM By: Harold Barban Entered By: Harold Barban on 10/23/2018 10:40:12 [name], [name] ([id number]) -------------------------------------------------------------------------------- HPI Details Patient Name: [name]. Date of Service: 10/23/2018 10:00 AM Medical Record Number: [id number] Patient Account Number: [id number] Date of Birth/Sex: 05-27-1941 (77 y.o. M) Treating RN: Harold Barban Primary Care Provider: Tedra Senegal Other Clinician: Referring Provider: Tedra Senegal Treating Provider/Extender: [name], HOYT Weeks in Treatment: 6 History of Present Illness HPI Description: 78 year old gentleman seen earlier this year in January for a abdominal wound is now back with the same problem which has recurred for about 2 weeks and he has been trying to apply some local antibiotic ointment and a Band-Aid there. Of note he has significant pressure in this area due to his beltline and may have put on some weight which results in his trousers being very tight around the waist. The else has changed in his HandP and his diabetes is pretty well controlled. most recent hemoglobin A1c on 12/06/2016 was 7.8%. he was reviewed by his endocrinologist Dr. Dwyane Dee who made appropriate modifications to his treatment plan. he was recently seen by neurology for mild cognitive impairment that was found to be normal for his age 15/21/18 patient appears to be doing well in regard to his abdominal wound area on evaluation today. He has been tolerating the dressing changes with the [facility] Dressing I'm pleased with how this has progressed and in fact the  wound appears to be completely healed on evaluation today. There is definitely no evidence of infection. 03/29/17 Patient's wound on the abdominal region appears to be completely healed on evaluation today. This wound has been doing very well and I think the biggest thing is going to be keeping it from reactivation where it rubbed on his pants. 04/18/2017 -- he  was recently discharged about 3 weeks ago and was using a binder which probably caused him abrasion on the area of the recently healed scar. 05/02/17 on evaluation today patient appears to be doing fairly well in regard to his abdominal wound at the site of the surgical scar. Unfortunately the scar tissue is very fragile and tends to reopen. He did get a small abdominal binder to try to pad the area in order to offload and prevent his pants from rubbing specifically at the site. With that being said he tells me that Dr. Con Memos was concerned that the binder might actually have rub the area causing a reopening. I did have a look at this today and unfortunately it appears that he was putting this over his pants which was making matters worse. 05/09/17 on evaluation today patient continues to do well with the [facility] Dressing. There is a slight skin tear inferior to the wound that we have been treating that may have been due to the dressing sticking slightly. This is minimal however. I did discuss with patient prior to removal of the dressing for dressing changes that they whet this in order to loosen it and not risk damaging in the field. Nonetheless it appears that they have been doing very well with the dressing changes he had his wife took over the dressing changes as she states that he was doing it wrong. Overall though I'm pleased with how things are progressing. No fevers, chills, nausea, or vomiting noted at this time. Readmission: 08/27/17 on evaluation today patient appears to be doing similarly to how he was doing prior to healing  previously August 2018 when we previously discharged him. He has fortunately been doing well up until just a couple of weeks ago and he had some of the [facility] Dressing over so he began to treat this on his own. He states that this has gotten much smaller but he ran out of supplies and therefore made an appointment to come back in for evaluation. No fevers, chills, nausea, or vomiting noted at this time. He has no discomfort at this point in regard to the wound. He does tell me that he has one maybe two compression fractures in his lower back and that he is going to be seen by the physician at [facility] imaging, Dr. Estanislado Pandy. ====== Old notes The 78 year old gentleman who has a past medical history of diabetes mellitus, end-stage renal disease on dialysis, hypertension, secondary hyperparathyroidism, peripheral vascular disease and history of previous arterial bifemoral bypass graft in 1992 also has a history of gout, hyperlipidemia, hypothyroidism. last hemoglobin A1c was 7.5%. status post AV fistula Hakim, EZEKIAL ARNS. ([id number]) placement, breast surgery for left granulomatous mastitis, triple a repair in 1992, small bowel obstruction, upper arm angiograms for AV fistula on the left side. was noted to have a superficial skin wound in the periumbilical area since late December 2017. He was asked to clean it with hydrogen peroxide and apply Bactroban. He was seen by vascular surgery in October 2017 where his right ABI was 0.56 left ABI was 0.87 and right TBI was 0.69 and left TBI was 0.70 ===== 09/25/16; patient was previously a patient of Dr. Ardeen Garland who has recurrent skin breakdown in the area of the large incision that was initially 4 apparently an open AAA aneurysm repair. He had secondary surgery but I'm not sure of what etiology this was. He is been using Hydrofera Blue to the open wounds in the mid abdominal surgical incision.  This is now closed over. I think he also uses this  area as his waist for his pants and I've asked him to adjust this Readmission: 02/11/18 on evaluation today patient presents for evaluation concerning a sacral pressure ulcer which has been present for several weeks. He states that he actually had several areas that were open there appears just be one remaining. He has been tolerating the dressing changes is wise been using Hydrofera Blue Dressing as left over from his abdominal ulcer that we have previously treated. Fortunately there does not appear to be any evidence of infection which is good news. With that being said he states that he's been having issues at one point even at dialysis he had to discontinue dialysis and go home due to the pain. He has not been using any cushion to sit on although he states he does have one that he can use. No fevers, chills, nausea, or vomiting noted at this time. He had a significant issue with diarrhea prior to this occurring and I believe this may have had some contributing factor as well. 02/18/18 on evaluation today patient's wound actually appears to be doing about the same. Unfortunately he really has not noted any significant improvement over last week he still is very tender even just to light touch unfortunately over the periwound region. There does not appear to be any evidence of infection which is good news. He is especially tender at the 12 o'clock location even a full fingers with away from the actual wound site. There still slough covering the surface of the wound. 03/04/18 on evaluation today patient's wound actually appears to show signs of great improvement compared to last week's evaluation. He is definitely showing improvement week by week which I think is great news. Obviously that is what we are looking for. Nonetheless I did not even need to perform any sharp debridement today being that there was no slough noted on the surface of the wound. His blood pressure continues to remain low  although this is something that is normal. That is for him. 03/18/18 on evaluation today patient's wound actually appears to potentially be completely healed although I cannot tell for sure. He has a small indeed very small eschar noted over the wound surface which also was measuring smaller than what it was previous. With that being said he has discomfort to the point that I'm not really able to do much with this in order to remove the eschar and prove that everything is completely healed underneath. Nonetheless it's starting to look like this may lift up on its own and I think that may be the best course for him currently. 04/01/18 on evaluation today patient appears to be doing very well in fact his wound is completely healed today. He's been tolerating the dressing changes without complication. With that being said I do believe at this point that he is ready for discharge she still having some discomfort deeper and he tells me this may just be some irritation from the long periods of time he has to sit for dialysis nonetheless he's not having the redness that was previously noted I think he has done very well trying to keep pressure off of this area. Readmission: 09/11/18 on evaluation today patient presents for reevaluation here in our clinic due to a remission with ulcers of the abdominal area. This is a region that he has previously been treated for bar clinic as well we were able to get this healed. 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 He states he was in the hospital when this just suddenly came up toward the end of his hospital stay. There really is nothing else currently going on indicating more significant issue at this point. Fortunately No fevers, chills, nausea, or vomiting noted at this time. The patient does have diabetes, is on dialysis, and states that the area does tend to hurt but only near the bottom of  the wound though this is rather painful. 10/02/18 on evaluation today patient's abdominal ulcer is actually appear to be doing excellent at this point. He's been tolerating the dressing changes without complication. I do believe that this is doing very well for him. No fevers, chills, nausea, or [place], [name]. ([id number]) vomiting noted at this time. 10/09/18 on evaluation today patient appears to be doing very well in regard to his abdominal ulcers. In fact one area is almost completely healed. The other is shown signs of good epithelialization at this point. Overall I see no signs of infection and things seem to be progressing nicely. 10/16/18 on evaluation today patient appears to be doing well in regard to his abdominal ulcers. He has been tolerating the dressing changes without complication. Fortunately there is no signs of infection at this time. Overall he seems to be doing very well. 10/23/18 on evaluation today patient actually appears to be doing very well in regard to his abdominal ulcer. This area has continued to heal quite nicely which is excellent news. Overall I'm very pleased with the progress he has made. Electronic Signature(s) Signed: 10/23/2018 9:52:35 PM By: Worthy Keeler PA-C Entered By: Worthy Keeler on 10/23/2018 10:47:39 [name], [name] ([id number]) -------------------------------------------------------------------------------- Physical Exam Details Patient Name: [name]. Date of Service: 10/23/2018 10:00 AM Medical Record Number: [id number] Patient Account Number: [id number] Date of Birth/Sex: [birth year] (77 y.o. M) Treating RN: Harold Barban Primary Care Provider: Tedra Senegal Other Clinician: Referring Provider: Tedra Senegal Treating Provider/Extender: [name], HOYT Weeks in Treatment: 6 Constitutional Well-nourished and well-hydrated in no acute distress. Respiratory normal breathing without difficulty. Psychiatric this patient is able to make  decisions and demonstrates good insight into disease process. Alert and Oriented x 3. pleasant and cooperative. Notes Patient's wound bed currently shows evidence of good granulation at this time there was some Slough noted on the surface of the wound which did require sharp debridement today. Post debridement the wound bed appears to be doing significantly better which is excellent news. Overall very pleased with how things have progressed. Electronic Signature(s) Signed: 10/23/2018 9:52:35 PM By: Worthy Keeler PA-C Entered By: Worthy Keeler on 10/23/2018 10:48:06 [name], [name] ([id number]) -------------------------------------------------------------------------------- Physician Orders Details Patient Name: [name]. Date of Service: 10/23/2018 10:00 AM Medical Record Number: [id number] Patient Account Number: [id number] Date of Birth/Sex: 06-28-1941 (77 y.o. 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 Anesthetic (add to Medication List) Wound #6 Right Abdomen - Lower Quadrant o Topical Lidocaine 4% cream applied to wound  bed prior to debridement (In Clinic Only). Primary Wound Dressing Wound #6 Right Abdomen - Lower Quadrant o Hydrafera Blue Ready Transfer Secondary Dressing Wound #6 Right Abdomen - Lower Quadrant o Boardered Foam Dressing Dressing Change Frequency Wound #6 Right Abdomen - Lower Quadrant o Change Dressing Monday, Wednesday,  Friday Follow-up Appointments Wound #6 Right Abdomen - Lower Quadrant o Return Appointment in 1 week. Home Health Wound #6 Right Abdomen - [place] Visits o Home Health Nurse may visit PRN to address patientos wound care needs. o FACE TO FACE ENCOUNTER: MEDICARE and MEDICAID PATIENTS: I certify that this patient is under my care and that I had a face-to-face encounter that meets the physician face-to-face encounter requirements with this patient on this date. The encounter with the patient was in whole or in part for the following MEDICAL CONDITION: (primary reason for [place]) MEDICAL NECESSITY: I certify, that based on my findings, NURSING services are a medically necessary home health service. HOME BOUND STATUS: I certify that my clinical findings support that this patient is homebound (i.e., Due to illness or injury, pt requires aid of [place], [name] ([id number]) supportive devices such as crutches, cane, wheelchairs, walkers, the use of special transportation or the assistance of another person to leave their place of residence. There is a normal inability to leave the home and doing so requires considerable and taxing effort. Other absences are for medical reasons / religious services and are infrequent or of short duration when for other reasons). o If current dressing causes regression in wound condition, may D/C ordered dressing product/s and apply Normal Saline Moist Dressing daily until next [place] / Other MD appointment. Maynard of regression in wound condition at [phone]. o Please direct any NON-WOUND related issues/requests for orders to patient's Primary Care Physician Electronic Signature(s) Signed: 10/23/2018 9:52:35 PM By: Worthy Keeler PA-C Signed: 12/01/2018 10:53:34 AM By: Harold Barban Entered By: Harold Barban on 10/23/2018 10:40:59 [name], [name]  ([id number]) -------------------------------------------------------------------------------- Problem List Details Patient Name: [name]. Date of Service: 10/23/2018 10:00 AM Medical Record Number: [id number] Patient Account Number: [id number] Date of Birth/Sex: [birth year] (77 y.o. 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 Date of Service: 10/23/2018 10:00 AM Medical Record Number: [id number] Patient Account Number: [id number] Date of Birth/Sex: 19-May-1941 (77 y.o. M) Treating RN: Harold Barban Primary Care Provider: Tedra Senegal Other Clinician: Referring Provider: Tedra Senegal Treating Provider/Extender: [name], HOYT Weeks in Treatment: 6 Subjective Chief Complaint Information obtained from Patient Abdominal ulcers History of Present Illness (HPI) 78 year old gentleman seen earlier this year in January for a abdominal wound is now back with the same problem which has recurred for about 2 weeks and he has been trying to apply some local antibiotic ointment and a Band-Aid there. Of note he has significant pressure in  this area due to  his beltline and may have put on some weight which results in his trousers being very tight around the waist. The else has changed in his HandP and his diabetes is pretty well controlled. most recent hemoglobin A1c on 12/06/2016 was 7.8%. he was reviewed by his endocrinologist Dr. Dwyane Dee who made appropriate modifications to his treatment plan. he was recently seen by neurology for mild cognitive impairment that was found to be normal for his age 36/21/18 patient appears to be doing well in regard to his abdominal wound area on evaluation today. He has been tolerating the dressing changes with the [facility] Dressing I'm pleased with how this has progressed and in fact the wound appears to be completely healed on evaluation today. There is definitely no evidence of infection. 03/29/17 Patient's wound on the abdominal region appears to be completely healed on evaluation today. This wound has been doing very well and I think the biggest thing is going to be keeping it from reactivation where it rubbed on his pants. 04/18/2017 -- he was recently discharged about 3 weeks ago and was using a binder which probably caused him abrasion on the area of the recently healed scar. 05/02/17 on evaluation today patient appears to be doing fairly well in regard to his abdominal wound at the site of the surgical scar. Unfortunately the scar tissue is very fragile and tends to reopen. He did get a small abdominal binder to try to pad the area in order to offload and prevent his pants from rubbing specifically at the site. With that being said he tells me that Dr. Con Memos was concerned that the binder might actually have rub the area causing a reopening. I did have a look at this today and unfortunately it appears that he was putting this over his pants which was making matters worse. 05/09/17 on evaluation today patient continues to do well with the [facility] Dressing. There is a slight skin tear inferior to the  wound that we have been treating that may have been due to the dressing sticking slightly. This is minimal however. I did discuss with patient prior to removal of the dressing for dressing changes that they whet this in order to loosen it and not risk damaging in the field. Nonetheless it appears that they have been doing very well with the dressing changes he had his wife took over the dressing changes as she states that he was doing it wrong. Overall though I'm pleased with how things are progressing. No fevers, chills, nausea, or vomiting noted at this time. Readmission: 08/27/17 on evaluation today patient appears to be doing similarly to how he was doing prior to healing previously August 2018 when we previously discharged him. He has fortunately been doing well up until just a couple of weeks ago and he had some of the [facility] Dressing over so he began to treat this on his own. He states that this has gotten much smaller but he ran out of supplies and therefore made an appointment to come back in for evaluation. No fevers, chills, nausea, or vomiting noted at this time. He has no discomfort at this point in regard to the wound. He does tell me that he has one maybe two compression fractures in his lower back and that he is going to be seen by the physician at [facility] imaging, Dr. Estanislado Pandy. [name], [name] ([id number]) ====== Old notes The 78 year old gentleman who has a past medical history of  diabetes mellitus, end-stage renal disease on dialysis, hypertension, secondary hyperparathyroidism, peripheral vascular disease and history of previous arterial bifemoral bypass graft in 1992 also has a history of gout, hyperlipidemia, hypothyroidism. last hemoglobin A1c was 7.5%. status post AV fistula placement, breast surgery for left granulomatous mastitis, triple a repair in 1992, small bowel obstruction, upper arm angiograms for AV fistula on the left side. was noted to have a  superficial skin wound in the periumbilical area since late December 2017. He was asked to clean it with hydrogen peroxide and apply Bactroban. He was seen by vascular surgery in October 2017 where his right ABI was 0.56 left ABI was 0.87 and right TBI was 0.69 and left TBI was 0.70 ===== 09/25/16; patient was previously a patient of Dr. Ardeen Garland who has recurrent skin breakdown in the area of the large incision that was initially 4 apparently an open AAA aneurysm repair. He had secondary surgery but I'm not sure of what etiology this was. He is been using Hydrofera Blue to the open wounds in the mid abdominal surgical incision. This is now closed over. I think he also uses this area as his waist for his pants and I've asked him to adjust this Readmission: 02/11/18 on evaluation today patient presents for evaluation concerning a sacral pressure ulcer which has been present for several weeks. He states that he actually had several areas that were open there appears just be one remaining. He has been tolerating the dressing changes is wise been using Hydrofera Blue Dressing as left over from his abdominal ulcer that we have previously treated. Fortunately there does not appear to be any evidence of infection which is good news. With that being said he states that he's been having issues at one point even at dialysis he had to discontinue dialysis and go home due to the pain. He has not been using any cushion to sit on although he states he does have one that he can use. No fevers, chills, nausea, or vomiting noted at this time. He had a significant issue with diarrhea prior to this occurring and I believe this may have had some contributing factor as well. 02/18/18 on evaluation today patient's wound actually appears to be doing about the same. Unfortunately he really has not noted any significant improvement over last week he still is very tender even just to light touch unfortunately over  the periwound region. There does not appear to be any evidence of infection which is good news. He is especially tender at the 12 o'clock location even a full fingers with away from the actual wound site. There still slough covering the surface of the wound. 03/04/18 on evaluation today patient's wound actually appears to show signs of great improvement compared to last week's evaluation. He is definitely showing improvement week by week which I think is great news. Obviously that is what we are looking for. Nonetheless I did not even need to perform any sharp debridement today being that there was no slough noted on the surface of the wound. His blood pressure continues to remain low although this is something that is normal. That is for him. 03/18/18 on evaluation today patient's wound actually appears to potentially be completely healed although I cannot tell for sure. He has a small indeed very small eschar noted over the wound surface which also was measuring smaller than what it was previous. With that being said he has discomfort to the point that I'm not really able to do much  with this in order to remove the eschar and prove that everything is completely healed underneath. Nonetheless it's starting to look like this may lift up on its own and I think that may be the best course for him currently. 04/01/18 on evaluation today patient appears to be doing very well in fact his wound is completely healed today. He's been tolerating the dressing changes without complication. With that being said I do believe at this point that he is ready for discharge she still having some discomfort deeper and he tells me this may just be some irritation from the long periods of time he has to sit for dialysis nonetheless he's not having the redness that was previously noted I think he has done very well trying to keep pressure off of this area. Readmission: 09/11/18 on evaluation today patient presents for  reevaluation here in our clinic due to a remission with ulcers of the abdominal area. This is a region that he has previously been treated for bar clinic as well we were able to get this healed. 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 He states he was in the hospital when this just suddenly came up toward the end of his hospital stay. There really is nothing else currently going on indicating more significant issue at this point. Fortunately No fevers, chills, nausea, or vomiting noted at this time. [name], [name] ([id number]) The patient does have diabetes, is on dialysis, and states that the area does tend to hurt but only near the bottom of the wound though this is rather painful. 10/02/18 on evaluation today patient's abdominal ulcer is actually appear to be doing excellent at this point. He's been tolerating the dressing changes without complication. I do believe that this is doing very well for him. No fevers, chills, nausea, or vomiting noted at this time. 10/09/18 on evaluation today patient appears to be doing very well in regard to his abdominal ulcers. In fact one area is almost completely healed. The other is shown signs of good epithelialization at this point. Overall I see no signs of infection and things seem to be progressing nicely. 10/16/18 on evaluation today patient appears to be doing well in regard to his abdominal ulcers. He has been tolerating the dressing changes without complication. Fortunately there is no signs of infection at this time. Overall he seems to be doing very well. 10/23/18 on evaluation today patient actually appears to be doing very well in regard to his abdominal ulcer. This area has continued to heal quite nicely which is excellent news. Overall I'm very pleased with the progress he has made. Patient History Information obtained from Patient. Family History Diabetes - Father,Paternal  Grandparents, Heart Disease - Father, Hypertension - Father, No family history of Cancer, Hereditary Spherocytosis, Kidney Disease, Lung Disease, Seizures, Stroke, Thyroid Problems, Tuberculosis. Social History Former smoker - quit in 1996, Marital Status - Married, Alcohol Use - Never - quit in 1976, Drug Use - No History, Caffeine Use - Never. Medical History Eyes Denies history of Cataracts, Glaucoma, Optic Neuritis Ear/Nose/Mouth/Throat Denies history of Chronic sinus problems/congestion, Middle ear problems Hematologic/Lymphatic Patient has history of Anemia Denies history of Hemophilia, Human Immunodeficiency Virus, Lymphedema, Sickle Cell Disease Cardiovascular Patient has history of Hypertension, Peripheral Venous Disease Denies history of Angina, Arrhythmia, Congestive Heart Failure, Coronary Artery Disease, Deep Vein Thrombosis, Hypotension, Myocardial Infarction, Peripheral Arterial Disease, Phlebitis, Vasculitis Gastrointestinal Denies history of Cirrhosis , Colitis,  Crohn s, Hepatitis A, Hepatitis B, Hepatitis C Endocrine Patient has history of Type II Diabetes Denies history of Type I Diabetes Genitourinary 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 Immunological Denies history of Lupus Erythematosus, Raynaud s, Scleroderma Integumentary (Skin) Denies history of History of Burn, History of pressure wounds Musculoskeletal Patient has history of Gout Denies history of Rheumatoid Arthritis, Osteoarthritis, Osteomyelitis Neurologic Patient has history of Neuropathy Denies history of Dementia, Quadriplegia, Paraplegia, Seizure Disorder [name], [name] ([id number]) Oncologic Denies history of Received Chemotherapy, Received Radiation Psychiatric Denies history of Anorexia/bulimia, Confinement Anxiety 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 Respiratory The patient has no complaints or symptoms. Cardiovascular The patient has no complaints or symptoms. Psychiatric The patient has no complaints or symptoms. Objective Constitutional Well-nourished and well-hydrated in no acute distress. Vitals Time Taken: 10:11 AM, Height: 71 in, Weight: 146 lbs, BMI: 20.4, Temperature: 97.7 F, Pulse: 79 bpm, Respiratory Rate: 16 breaths/min, Blood Pressure: 104/68 mmHg. Respiratory normal breathing without difficulty. Psychiatric this patient is able to make decisions and demonstrates good insight into disease process. Alert and Oriented x 3. pleasant and cooperative. General Notes: Patient's wound bed currently shows evidence of good granulation at this time there was some Slough noted on the surface of the wound which did require sharp debridement today. Post debridement the wound bed appears to be doing significantly better which is excellent news. Overall very pleased with how things have progressed. Integumentary (Hair, Skin) Wound #6 status is Open. Original cause of wound was Blister. The wound is located on the Right Abdomen - Lower Quadrant. The wound measures 1cm length x 0.7cm width x 0.1cm depth; 0.55cm^2 area and 0.055cm^3 volume. There is Fat Layer (Subcutaneous Tissue) Exposed exposed. There is no tunneling or undermining noted. There is a small amount of serous drainage noted. The wound margin is flat and intact. There is large (67-100%) pink granulation within the wound bed. There is a small (1-33%) amount of necrotic tissue within the wound bed including Adherent Slough. The periwound skin appearance exhibited: Scarring. The periwound skin appearance did not exhibit: Callus, Crepitus, Excoriation, Induration, Rash, Dry/Scaly, Maceration, Atrophie Blanche, Cyanosis, Ecchymosis, Hemosiderin Staining, Mottled, Pallor,  Rubor, Bunten, [name] ([id number]) Erythema. Periwound temperature was noted as No Abnormality. The periwound has tenderness on palpation. 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 There was a Excisional Skin/Subcutaneous Tissue Debridement with a total area of 0.7 sq cm performed by STONE III, HOYT E., PA-C. With the following instrument(s): Curette to remove Viable and Non-Viable tissue/material. Material removed includes Subcutaneous Tissue and Slough and after achieving pain control using Lidocaine. No specimens were taken. A time out was conducted at 10:39, prior to the start of the procedure. A Minimum amount of bleeding was controlled with Pressure. The procedure was tolerated well with a pain level of 0 throughout and a pain level of 0 following the procedure. Post Debridement Measurements: 1cm length x 0.7cm width x 0.1cm depth; 0.055cm^3 volume. Character of Wound/Ulcer Post Debridement is improved. Post procedure Diagnosis Wound #6: Same as Pre-Procedure Plan Wound Cleansing:  Wound #6 Right Abdomen - Lower Quadrant: Clean wound with Normal Saline. Anesthetic (add to Medication List): Wound #6 Right Abdomen - Lower Quadrant: Topical Lidocaine 4% cream applied to wound bed prior to debridement (In Clinic Only). Primary Wound Dressing: Wound #6 Right Abdomen - Lower Quadrant: Hydrafera Blue Ready Transfer Secondary Dressing: Wound #6 Right Abdomen - Lower Quadrant: Boardered Foam Dressing Dressing Change Frequency: Wound #6 Right Abdomen - Lower Quadrant: Change Dressing Monday, Wednesday, Friday Follow-up Appointments: Wound #6 Right Abdomen - Lower Quadrant: Return  Appointment in 1 week. COLEMAN, KALAS ([id number]) Home Health: Wound #6 Right Abdomen - Lower Quadrant: [place] Nurse may visit PRN to address patient s wound care needs. FACE TO FACE ENCOUNTER: MEDICARE and MEDICAID PATIENTS: I certify that this patient is under my care and that I had a face-to-face encounter that meets the physician face-to-face encounter requirements with this patient on this date. The encounter with the patient was in whole or in part for the following MEDICAL CONDITION: (primary reason for [place]) MEDICAL NECESSITY: I certify, that based on my findings, NURSING services are a medically necessary home health service. HOME BOUND STATUS: I certify that my clinical findings support that this patient is homebound (i.e., Due to illness or injury, pt requires aid of supportive devices such as crutches, cane, wheelchairs, walkers, the use of special transportation or the assistance of another person to leave their place of residence. There is a normal inability to leave the home and doing so requires considerable and taxing effort. Other absences are for medical reasons / religious services and are infrequent or of short duration when for other reasons). If current dressing causes regression in wound condition, may D/C ordered dressing product/s and apply Normal Saline Moist Dressing daily until next [place] / Other MD appointment. [place] of regression in wound condition at [phone]. Please direct any NON-WOUND related issues/requests for orders to patient's Primary Care Physician I'm in a recommend currently that we continue with the [facility] Dressing as this seems to be beneficial for him. The patient is in agreement that plan. We will subsequently see were things stand at follow-up. Please see above for specific wound care orders. We will see patient for re-evaluation in 1 week(s) here in the  clinic. If anything worsens or changes patient will contact our office for additional recommendations. Electronic Signature(s) Signed: 10/23/2018 9:52:35 PM By: Worthy Keeler PA-C Entered By: Worthy Keeler on 10/23/2018 10:48:16 L[place]ignan, [name] ([id number]) -------------------------------------------------------------------------------- ROS/PFSH Details Patient Name: [name]. Date of Service: 10/23/2018 10:00 AM Medical Record Number: [id number] Patient Account Number: [id number] Date of Birth/Sex: 1940-11-24 (77 y.o. M) Treating RN: Harold Barban Primary Care Provider: Tedra Senegal Other Clinician: Referring Provider: Tedra Senegal Treating Provider/Extender: [name], HOYT Weeks in Treatment: 6 Information Obtained From Patient Wound History Do you currently have one or more open woundso Yes How many open wounds do you currently haveo 2 Approximately how long have you had your woundso 2 weeks How have you been treating your wound(s) until nowo zinc ,drsg Has your wound(s) ever healed and then re-openedo No Have you had any lab work done in the past montho Yes Who ordered the lab work doneo [facility] doctor [place] Have you tested positive for an antibiotic resistant organism (MRSA, VRE)o No Have you tested positive for osteomyelitis (bone infection)o No Have you had any tests for circulation on your legso No Constitutional Symptoms (General Health)  Complaints and Symptoms: Negative for: Fever; Chills Eyes Medical History: Negative for: Cataracts; Glaucoma; Optic Neuritis Ear/Nose/Mouth/Throat Medical History: Negative for: Chronic sinus problems/congestion; Middle ear problems Hematologic/Lymphatic Medical History: Positive for: Anemia Negative for: Hemophilia; Human Immunodeficiency Virus; Lymphedema; Sickle Cell Disease Respiratory Complaints and Symptoms: No Complaints or Symptoms Cardiovascular Complaints and Symptoms: No Complaints or Symptoms Medical  History: Positive for: Hypertension; Peripheral Venous Disease Negative for: Angina; Arrhythmia; Congestive Heart Failure; Coronary Artery Disease; Deep Vein Thrombosis; Hypotension; Myocardial Infarction; Peripheral Arterial Disease; Phlebitis; Vasculitis Smolen, CHEIKH BRAMBLE ([id number]) Past Medical History Notes: artificial valve, hyperlipidemia Gastrointestinal Medical History: Negative for: Cirrhosis ; Colitis; Crohnos; Hepatitis A; Hepatitis B; Hepatitis C Past Medical History Notes: scar tissue from multiple surgeries Endocrine Medical History: Positive for: Type II Diabetes Negative for: Type I Diabetes Treated with: Oral agents Genitourinary Medical History: 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 Immunological Medical History: Negative for: Lupus Erythematosus; Raynaudos; Scleroderma Integumentary (Skin) Medical History: Negative for: History of Burn; History of pressure wounds Musculoskeletal Medical History: Positive for: Gout Negative for: Rheumatoid Arthritis; Osteoarthritis; Osteomyelitis Neurologic Medical History: Positive for: Neuropathy Negative for: Dementia; Quadriplegia; Paraplegia; Seizure Disorder Oncologic Medical History: Negative for: Received Chemotherapy; Received Radiation Psychiatric Complaints and Symptoms: No Complaints or Symptoms Medical History: Negative for: Anorexia/bulimia; Confinement Anxiety Immunizations Marten, [name] ([id number]) Pneumococcal Vaccine: Received Pneumococcal Vaccination: Yes Immunization Notes: uo to date Implantable Devices Family and Social History Cancer: No; Diabetes: Yes - Father,Paternal Grandparents; Heart Disease: Yes - Father; Hereditary Spherocytosis: No; Hypertension: Yes - Father; Kidney Disease: No; Lung Disease: No; Seizures: No; Stroke: No; Thyroid Problems: No; Tuberculosis: No; Former smoker - quit in 1996; Marital Status - Married; Alcohol Use: Never - quit in 1976; Drug Use:  No History; Caffeine Use: Never; Financial Concerns: No; Food, Clothing or Shelter Needs: No; Support System Lacking: No; Transportation Concerns: No; Advanced Directives: No; Patient does not want information on Advanced Directives Physician Affirmation I have reviewed and agree with the above information. Electronic Signature(s) Signed: 10/23/2018 9:52:35 PM By: Worthy Keeler PA-C Signed: 12/01/2018 10:53:34 AM By: Harold Barban Entered By: Worthy Keeler on 10/23/2018 10:47:56 [place], [name] ([id number]) -------------------------------------------------------------------------------- SuperBill Details Patient Name: [name]. Date of Service: 10/23/2018 Medical Record Number: [id number] Patient Account Number: [id number] Date of Birth/Sex: Mar 01, 1941 (78 y.o. M) Treating RN: Harold Barban Primary Care Provider: Tedra Senegal Other Clinician: Referring Provider: Tedra Senegal Treating Provider/Extender: [name], HOYT Weeks in Treatment: 6 Diagnosis Coding ICD-10 Codes Code Description E83.59 Other disorders of calcium metabolism L98.492 Non-pressure chronic ulcer of skin of other sites with fat layer exposed E11.622 Type 2 diabetes mellitus with other skin ulcer Z99.2 Dependence on renal dialysis Facility Procedures CPT4 Code: [id number] Description: 82423 - DEB SUBQ TISSUE 20 SQ CM/< ICD-10 Diagnosis Description L98.492 Non-pressure chronic ulcer of skin of other sites with fat l Modifier: ayer exposed Quantity: 1 Physician Procedures CPT4 Code: 5361443 Description: 15400 - WC PHYS SUBQ TISS 20 SQ CM ICD-10 Diagnosis Description L98.492 Non-pressure chronic ulcer of skin of other sites with fat la Modifier: yer exposed Quantity: 1 Electronic Signature(s) Signed: 10/23/2018 9:52:35 PM By: Worthy Keeler PA-C Entered By: Worthy Keeler on 10/23/2018 10:48:23

## 2018-12-01 NOTE — Progress Notes (Signed)
GRANT, SWAGER ([id number]) Visit Report for 11/27/2018 Chief Complaint Document Details Patient Name: [name], [name]. Date of Service: 11/27/2018 2:00 PM Medical Record Number: [id number] Patient Account Number: [id number] Date of Birth/Sex: 1941-08-31 (77 y.o. M) Treating RN: Army Melia Primary Care Provider: Tedra Senegal Other Clinician: Referring Provider: Tedra Senegal Treating Provider/Extender: [name], Allien Melberg Weeks in Treatment: 11 Information Obtained from: Patient Chief Complaint Abdominal ulcers Electronic Signature(s) Signed: 11/30/2018 11:30:14 PM By: Worthy Keeler PA-C Entered By: Worthy Keeler on 11/29/2018 06:46:13 Koerner, [name] ([id number]) -------------------------------------------------------------------------------- HPI Details Patient Name: [name]. Date of Service: 11/27/2018 2:00 PM Medical Record Number: [id number] Patient Account Number: [id number] Date of Birth/Sex: 1941/03/02 (77 y.o. M) Treating RN: Army Melia Primary Care Provider: Tedra Senegal Other Clinician: Referring Provider: Tedra Senegal Treating Provider/Extender: [name], Raeana Blinn Weeks in Treatment: 11 History of Present Illness HPI Description: 78 year old gentleman seen earlier this year in January for a abdominal wound is now back with the same problem which has recurred for about 2 weeks and he has been trying to apply some local antibiotic ointment and a Band-Aid there. Of note he has significant pressure in this area due to his beltline and may have put on some weight which results in his trousers being very tight around the waist. The else has changed in his HandP and his diabetes is pretty well controlled. most recent hemoglobin A1c on 12/06/2016 was 7.8%. he was reviewed by his endocrinologist Dr. Dwyane Dee who made appropriate modifications to his treatment plan. he was recently seen by neurology for mild cognitive impairment that was found to be normal for his age 44/21/18  patient appears to be doing well in regard to his abdominal wound area on evaluation today. He has been tolerating the dressing changes with the [facility] Dressing I'm pleased with how this has progressed and in fact the wound appears to be completely healed on evaluation today. There is definitely no evidence of infection. 03/29/17 Patient's wound on the abdominal region appears to be completely healed on evaluation today. This wound has been doing very well and I think the biggest thing is going to be keeping it from reactivation where it rubbed on his pants. 04/18/2017 -- he was recently discharged about 3 weeks ago and was using a binder which probably caused him abrasion on the area of the recently healed scar. 05/02/17 on evaluation today patient appears to be doing fairly well in regard to his abdominal wound at the site of the surgical scar. Unfortunately the scar tissue is very fragile and tends to reopen. He did get a small abdominal binder to try to pad the area in order to offload and prevent his pants from rubbing specifically at the site. With that being said he tells me that Dr. Con Memos was concerned that the binder might actually have rub the area causing a reopening. I did have a look at this today and unfortunately it appears that he was putting this over his pants which was making matters worse. 05/09/17 on evaluation today patient continues to do well with the [facility] Dressing. There is a slight skin tear inferior to the wound that we have been treating that may have been due to the dressing sticking slightly. This is minimal however. I did discuss with patient prior to removal of the dressing for dressing changes that they whet this in order to loosen it and not risk damaging in the field. Nonetheless it appears that they have been doing  very well with the dressing changes he had his wife took over the dressing changes as she states that he was doing it wrong. Overall  though I'm pleased with how things are progressing. No fevers, chills, nausea, or vomiting noted at this time. Readmission: 08/27/17 on evaluation today patient appears to be doing similarly to how he was doing prior to healing previously August 2018 when we previously discharged him. He has fortunately been doing well up until just a couple of weeks ago and he had some of the [facility] Dressing over so he began to treat this on his own. He states that this has gotten much smaller but he ran out of supplies and therefore made an appointment to come back in for evaluation. No fevers, chills, nausea, or vomiting noted at this time. He has no discomfort at this point in regard to the wound. He does tell me that he has one maybe two compression fractures in his lower back and that he is going to be seen by the physician at [facility] imaging, Dr. Estanislado Pandy. ====== Old notes The 78 year old gentleman who has a past medical history of diabetes mellitus, end-stage renal disease on dialysis, hypertension, secondary hyperparathyroidism, peripheral vascular disease and history of previous arterial bifemoral bypass graft in 1992 also has a history of gout, hyperlipidemia, hypothyroidism. last hemoglobin A1c was 7.5%. status post AV fistula Rawlins, [name]. ([id number]) placement, breast surgery for left granulomatous mastitis, triple a repair in 1992, small bowel obstruction, upper arm angiograms for AV fistula on the left side. was noted to have a superficial skin wound in the periumbilical area since late December 2017. He was asked to clean it with hydrogen peroxide and apply Bactroban. He was seen by vascular surgery in October 2017 where his right ABI was 0.56 left ABI was 0.87 and right TBI was 0.69 and left TBI was 0.70 ===== 09/25/16; patient was previously a patient of Dr. Ardeen Garland who has recurrent skin breakdown in the area of the large incision that was initially 4 apparently an open AAA  aneurysm repair. He had secondary surgery but I'm not sure of what etiology this was. He is been using Hydrofera Blue to the open wounds in the mid abdominal surgical incision. This is now closed over. I think he also uses this area as his waist for his pants and I've asked him to adjust this Readmission: 02/11/18 on evaluation today patient presents for evaluation concerning a sacral pressure ulcer which has been present for several weeks. He states that he actually had several areas that were open there appears just be one remaining. He has been tolerating the dressing changes is wise been using Hydrofera Blue Dressing as left over from his abdominal ulcer that we have previously treated. Fortunately there does not appear to be any evidence of infection which is good news. With that being said he states that he's been having issues at one point even at dialysis he had to discontinue dialysis and go home due to the pain. He has not been using any cushion to sit on although he states he does have one that he can use. No fevers, chills, nausea, or vomiting noted at this time. He had a significant issue with diarrhea prior to this occurring and I believe this may have had some contributing factor as well. 02/18/18 on evaluation today patient's wound actually appears to be doing about the same. Unfortunately he really has not noted any significant improvement over last week he still is  very tender even just to light touch unfortunately over the periwound region. There does not appear to be any evidence of infection which is good news. He is especially tender at the 12 o'clock location even a full fingers with away from the actual wound site. There still slough covering the surface of the wound. 03/04/18 on evaluation today patient's wound actually appears to show signs of great improvement compared to last week's evaluation. He is definitely showing improvement week by week which I think is great news.  Obviously that is what we are looking for. Nonetheless I did not even need to perform any sharp debridement today being that there was no slough noted on the surface of the wound. His blood pressure continues to remain low although this is something that is normal. That is for him. 03/18/18 on evaluation today patient's wound actually appears to potentially be completely healed although I cannot tell for sure. He has a small indeed very small eschar noted over the wound surface which also was measuring smaller than what it was previous. With that being said he has discomfort to the point that I'm not really able to do much with this in order to remove the eschar and prove that everything is completely healed underneath. Nonetheless it's starting to look like this may lift up on its own and I think that may be the best course for him currently. 04/01/18 on evaluation today patient appears to be doing very well in fact his wound is completely healed today. He's been tolerating the dressing changes without complication. With that being said I do believe at this point that he is ready for discharge she still having some discomfort deeper and he tells me this may just be some irritation from the long periods of time he has to sit for dialysis nonetheless he's not having the redness that was previously noted I think he has done very well trying to keep pressure off of this area. Readmission: 09/11/18 on evaluation today patient presents for reevaluation here in our clinic due to a remission with ulcers of the abdominal area. This is a region that he has previously been treated for bar clinic as well we were able to get this healed. 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 He states he was in the hospital when this just suddenly came up toward the end of his hospital stay. There really is nothing else currently going on indicating more  significant issue at this point. Fortunately No fevers, chills, nausea, or vomiting noted at this time. The patient does have diabetes, is on dialysis, and states that the area does tend to hurt but only near the bottom of the wound though this is rather painful. 10/02/18 on evaluation today patient's abdominal ulcer is actually appear to be doing excellent at this point. He's been tolerating the dressing changes without complication. I do believe that this is doing very well for him. No fevers, chills, nausea, or Smyers, MELIK BLANCETT. ([id number]) vomiting noted at this time. 10/09/18 on evaluation today patient appears to be doing very well in regard to his abdominal ulcers. In fact one area is almost completely healed. The other is shown signs of good epithelialization at this point. Overall I see no signs of infection and things seem to be progressing nicely. 10/16/18 on evaluation today patient appears to be doing well in regard to his abdominal ulcers. He has been tolerating the dressing changes  without complication. Fortunately there is no signs of infection at this time. Overall he seems to be doing very well. 10/23/18 on evaluation today patient actually appears to be doing very well in regard to his abdominal ulcer. This area has continued to heal quite nicely which is excellent news. Overall I'm very pleased with the progress he has made. 11/06/18 on evaluation today patient actually appears to be doing excellent in regard to his which is good news. Fortunately there's no signs of infection at this time. No fevers, chills, nausea, or vomiting noted at this time. In general the patient's been very pleased with how things seem to be progressing which is excellent news as well. Overall there's no signs of infection at this time. 11/13/18 on evaluation today patient appears to be healed in regard to his abdominal ulcer. He's been tolerating the dressing changes without complication. Fortunately there  does not appear to be any signs of infection at this time which is good news. I'm very pleased with how everything seems to be progressing. 11/27/18 on evaluation today patient actually appears to be completely healed in regard to his abdominal ulcer. He has done extremely well in this regard I'm very pleased. Fortunately there does not appear to be any signs of infection which is also good news. Overall I think that he has been doing extremely well with regard to how quickly this ulcer healed. Electronic Signature(s) Signed: 11/30/2018 11:30:14 PM By: Worthy Keeler PA-C Entered By: Worthy Keeler on 11/29/2018 06:46:46 Casstevens, [name] ([id number]) -------------------------------------------------------------------------------- Physical Exam Details Patient Name: [name]. Date of Service: 11/27/2018 2:00 PM Medical Record Number: [id number] Patient Account Number: [id number] Date of Birth/Sex: [birth year] (77 y.o. M) Treating RN: Army Melia Primary Care Provider: Tedra Senegal Other Clinician: Referring Provider: Tedra Senegal Treating Provider/Extender: [name], Endya Austin Weeks in Treatment: 20 Constitutional Well-nourished and well-hydrated in no acute distress. Respiratory normal breathing without difficulty. Psychiatric this patient is able to make decisions and demonstrates good insight into disease process. Alert and Oriented x 3. pleasant and cooperative. Notes Patient's wound bed again shows complete epithelialization in the area as though exiting scar tissue appears to be doing very well. There's nothing open and no signs of infection. Electronic Signature(s) Signed: 11/30/2018 11:30:14 PM By: Worthy Keeler PA-C Entered By: Worthy Keeler on 11/29/2018 06:47:22 Twining, [name] ([id number]) -------------------------------------------------------------------------------- Physician Orders Details Patient Name: [name]. Date of Service: 11/27/2018 2:00 PM Medical  Record Number: [id number] Patient Account Number: [id number] Date of Birth/Sex: 08-26-1941 (77 y.o. M) Treating RN: Army Melia Primary Care Provider: Tedra Senegal Other Clinician: Referring Provider: Tedra Senegal Treating Provider/Extender: [name], Johnn Krasowski Weeks in Treatment: 41 Verbal / Phone Orders: No Diagnosis Coding Discharge From [facility] o Discharge from [place] - treatment complete, place foam on lower abdomen to prevent rubbing and corn callus pad over left fifth toe Electronic Signature(s) Signed: 11/28/2018 3:17:07 PM By: Army Melia Signed: 11/30/2018 11:30:14 PM By: Worthy Keeler PA-C Entered By: Army Melia on 11/27/2018 14:46:52 Tully, [name] ([id number]) -------------------------------------------------------------------------------- Problem List Details Patient Name: [name]. Date of Service: 11/27/2018 2:00 PM Medical Record Number: [id number] Patient Account Number: [id number] Date of Birth/Sex: May 08, 1941 (77 y.o. 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 Date of Service: 11/27/2018 2:00 PM Medical  Record Number: [id number] Patient Account Number: [id number] Date of Birth/Sex: 1941-06-29 (77 y.o. M) Treating RN: Army Melia Primary Care Provider: Tedra Senegal Other Clinician: Referring Provider: Tedra Senegal Treating Provider/Extender: [name], Keshan Reha Weeks in Treatment: 11 Subjective Chief Complaint Information obtained from Patient Abdominal ulcers History of Present Illness (HPI) 78 year old gentleman seen earlier this year in January for a abdominal wound is now back with the same problem which has recurred for about 2 weeks and he has been trying to apply some local antibiotic ointment and a Band-Aid there. Of note he has significant pressure in this area due to his beltline and may have put on some weight which results in his trousers being very tight around the waist. The else has changed in his HandP and his diabetes is pretty well controlled. most recent hemoglobin A1c on 12/06/2016 was 7.8%. he was reviewed by his endocrinologist Dr. Dwyane Dee who made appropriate modifications to his treatment plan. he was recently seen by neurology for mild cognitive impairment that was found to be normal for his age 13/21/18 patient appears to be doing well in regard to his abdominal wound area on evaluation today. He has been tolerating the dressing changes with the [facility] Dressing I'm pleased with how this has progressed and in fact the wound appears to be completely healed on evaluation today. There is definitely no evidence of infection. 03/29/17 Patient's wound on the abdominal region appears to be completely healed on evaluation today. This wound has been doing very well and I think the biggest thing is going to be keeping it from reactivation where it rubbed on his pants. 04/18/2017 -- he was recently discharged about 3 weeks ago and was using a binder which probably caused him abrasion on the area of the recently healed scar. 05/02/17 on evaluation today patient appears to be doing  fairly well in regard to his abdominal wound at the site of the surgical scar. Unfortunately the scar tissue is very fragile and tends to reopen. He did get a small abdominal binder to try to pad the area in order to offload and prevent his pants from rubbing specifically at the site. With that being said he tells me that Dr. Con Memos was concerned that the binder might actually have rub the area causing a reopening. I did have a look at this today and unfortunately it appears that he was putting this over his pants which was making matters worse. 05/09/17 on evaluation today patient continues to do well with the [facility] Dressing. There is a slight skin tear inferior to the wound that we have been treating that may have been due to the dressing sticking slightly. This is minimal however. I did discuss with patient prior to removal of the dressing for dressing changes that they whet this in order to loosen it and not risk damaging in the field. Nonetheless it appears that they have been doing very well with the dressing changes he had his wife took over the dressing changes as she states that  he was doing it wrong. Overall though I'm pleased with how things are progressing. No fevers, chills, nausea, or vomiting noted at this time. Readmission: 08/27/17 on evaluation today patient appears to be doing similarly to how he was doing prior to healing previously August 2018 when we previously discharged him. He has fortunately been doing well up until just a couple of weeks ago and he had some of the [facility] Dressing over so he began to treat this on his own. He states that this has gotten much smaller but he ran out of supplies and therefore made an appointment to come back in for evaluation. No fevers, chills, nausea, or vomiting noted at this time. He has no discomfort at this point in regard to the wound. He does tell me that he has one maybe two compression fractures in his lower back and  that he is going to be seen by the physician at [facility] imaging, Dr. Estanislado Pandy. Glasper, MARCEL GARY ([id number]) ====== Old notes The 78 year old gentleman who has a past medical history of diabetes mellitus, end-stage renal disease on dialysis, hypertension, secondary hyperparathyroidism, peripheral vascular disease and history of previous arterial bifemoral bypass graft in 1992 also has a history of gout, hyperlipidemia, hypothyroidism. last hemoglobin A1c was 7.5%. status post AV fistula placement, breast surgery for left granulomatous mastitis, triple a repair in 1992, small bowel obstruction, upper arm angiograms for AV fistula on the left side. was noted to have a superficial skin wound in the periumbilical area since late December 2017. He was asked to clean it with hydrogen peroxide and apply Bactroban. He was seen by vascular surgery in October 2017 where his right ABI was 0.56 left ABI was 0.87 and right TBI was 0.69 and left TBI was 0.70 ===== 09/25/16; patient was previously a patient of Dr. Ardeen Garland who has recurrent skin breakdown in the area of the large incision that was initially 4 apparently an open AAA aneurysm repair. He had secondary surgery but I'm not sure of what etiology this was. He is been using Hydrofera Blue to the open wounds in the mid abdominal surgical incision. This is now closed over. I think he also uses this area as his waist for his pants and I've asked him to adjust this Readmission: 02/11/18 on evaluation today patient presents for evaluation concerning a sacral pressure ulcer which has been present for several weeks. He states that he actually had several areas that were open there appears just be one remaining. He has been tolerating the dressing changes is wise been using Hydrofera Blue Dressing as left over from his abdominal ulcer that we have previously treated. Fortunately there does not appear to be any evidence of infection which is good news. With  that being said he states that he's been having issues at one point even at dialysis he had to discontinue dialysis and go home due to the pain. He has not been using any cushion to sit on although he states he does have one that he can use. No fevers, chills, nausea, or vomiting noted at this time. He had a significant issue with diarrhea prior to this occurring and I believe this may have had some contributing factor as well. 02/18/18 on evaluation today patient's wound actually appears to be doing about the same. Unfortunately he really has not noted any significant improvement over last week he still is very tender even just to light touch unfortunately over the periwound region. There does not appear to be any  evidence of infection which is good news. He is especially tender at the 12 o'clock location even a full fingers with away from the actual wound site. There still slough covering the surface of the wound. 03/04/18 on evaluation today patient's wound actually appears to show signs of great improvement compared to last week's evaluation. He is definitely showing improvement week by week which I think is great news. Obviously that is what we are looking for. Nonetheless I did not even need to perform any sharp debridement today being that there was no slough noted on the surface of the wound. His blood pressure continues to remain low although this is something that is normal. That is for him. 03/18/18 on evaluation today patient's wound actually appears to potentially be completely healed although I cannot tell for sure. He has a small indeed very small eschar noted over the wound surface which also was measuring smaller than what it was previous. With that being said he has discomfort to the point that I'm not really able to do much with this in order to remove the eschar and prove that everything is completely healed underneath. Nonetheless it's starting to look like this may lift up on  its own and I think that may be the best course for him currently. 04/01/18 on evaluation today patient appears to be doing very well in fact his wound is completely healed today. He's been tolerating the dressing changes without complication. With that being said I do believe at this point that he is ready for discharge she still having some discomfort deeper and he tells me this may just be some irritation from the long periods of time he has to sit for dialysis nonetheless he's not having the redness that was previously noted I think he has done very well trying to keep pressure off of this area. Readmission: 09/11/18 on evaluation today patient presents for reevaluation here in our clinic due to a remission with ulcers of the abdominal area. This is a region that he has previously been treated for bar clinic as well we were able to get this healed. 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 He states he was in the hospital when this just suddenly came up toward the end of his hospital stay. There really is nothing else currently going on indicating more significant issue at this point. Fortunately No fevers, chills, nausea, or vomiting noted at this time. Dzik, CROIX PRESLEY ([id number]) The patient does have diabetes, is on dialysis, and states that the area does tend to hurt but only near the bottom of the wound though this is rather painful. 10/02/18 on evaluation today patient's abdominal ulcer is actually appear to be doing excellent at this point. He's been tolerating the dressing changes without complication. I do believe that this is doing very well for him. No fevers, chills, nausea, or vomiting noted at this time. 10/09/18 on evaluation today patient appears to be doing very well in regard to his abdominal ulcers. In fact one area is almost completely healed. The other is shown signs of good epithelialization at this point.  Overall I see no signs of infection and things seem to be progressing nicely. 10/16/18 on evaluation today patient appears to be doing well in regard to his abdominal ulcers. He has been tolerating the dressing changes without complication. Fortunately there is no signs of infection at this time. Overall he seems to be doing very  well. 10/23/18 on evaluation today patient actually appears to be doing very well in regard to his abdominal ulcer. This area has continued to heal quite nicely which is excellent news. Overall I'm very pleased with the progress he has made. 11/06/18 on evaluation today patient actually appears to be doing excellent in regard to his which is good news. Fortunately there's no signs of infection at this time. No fevers, chills, nausea, or vomiting noted at this time. In general the patient's been very pleased with how things seem to be progressing which is excellent news as well. Overall there's no signs of infection at this time. 11/13/18 on evaluation today patient appears to be healed in regard to his abdominal ulcer. He's been tolerating the dressing changes without complication. Fortunately there does not appear to be any signs of infection at this time which is good news. I'm very pleased with how everything seems to be progressing. 11/27/18 on evaluation today patient actually appears to be completely healed in regard to his abdominal ulcer. He has done extremely well in this regard I'm very pleased. Fortunately there does not appear to be any signs of infection which is also good news. Overall I think that he has been doing extremely well with regard to how quickly this ulcer healed. Patient History Information obtained from Patient. Family History Diabetes - Father,Paternal Grandparents, Heart Disease - Father, Hypertension - Father, No family history of Cancer, Hereditary Spherocytosis, Kidney Disease, Lung Disease, Seizures, Stroke, Thyroid  Problems, Tuberculosis. Social History Former smoker - quit in 1996, Marital Status - Married, Alcohol Use - Never - quit in 1976, Drug Use - No History, Caffeine Use - Never. Medical History Eyes Denies history of Cataracts, Glaucoma, Optic Neuritis Ear/Nose/Mouth/Throat Denies history of Chronic sinus problems/congestion, Middle ear problems Hematologic/Lymphatic Patient has history of Anemia Denies history of Hemophilia, Human Immunodeficiency Virus, Lymphedema, Sickle Cell Disease Cardiovascular Patient has history of Hypertension, Peripheral Venous Disease Denies history of Angina, Arrhythmia, Congestive Heart Failure, Coronary Artery Disease, Deep Vein Thrombosis, Hypotension, Myocardial Infarction, Peripheral Arterial Disease, Phlebitis, Vasculitis Gastrointestinal Denies history of Cirrhosis , Colitis, Crohn s, Hepatitis A, Hepatitis B, Hepatitis C Endocrine Patient has history of Type II Diabetes Cimino, STILLMAN BUENGER ([id number]) Denies history of Type I Diabetes Genitourinary 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 Immunological Denies history of Lupus Erythematosus, Raynaud s, Scleroderma Integumentary (Skin) Denies history of History of Burn, History of pressure wounds Musculoskeletal Patient has history of Gout Denies history of Rheumatoid Arthritis, Osteoarthritis, Osteomyelitis Neurologic Patient has history of Neuropathy Denies history of Dementia, Quadriplegia, Paraplegia, Seizure Disorder Oncologic Denies history of Received Chemotherapy, Received Radiation Psychiatric Denies history of Anorexia/bulimia, Confinement Anxiety 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 Respiratory The patient has no complaints or  symptoms. Cardiovascular The patient has no complaints or symptoms. Psychiatric The patient has no complaints or symptoms. Objective Constitutional Well-nourished and well-hydrated in no acute distress. Vitals Time Taken: 2:15 PM, Height: 71 in, Weight: 146 lbs, BMI: 20.4, Temperature: 97.9 F, Pulse: 70 bpm, Respiratory Rate: 18 breaths/min, Blood Pressure: 117/47 mmHg. Respiratory normal breathing without difficulty. Psychiatric this patient is able to make decisions and demonstrates good insight into disease process. Alert and Oriented x 3. pleasant and cooperative. Scerbo, BURLE KWAN ([id number]) General Notes: Patient's wound bed again shows complete epithelialization in the area as though  exiting scar tissue appears to be doing very well. There's nothing open and no signs of infection. 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 The patient is in agreement with plan. Subsequently we will see him back for reevaluation in the future as needed. Anything changes or worsens he will contact the office and let me know. I did suggest that he used a [place] callous pad over his left fifth toe and that he placed a piece of foam in the lower abdomen area to prevent this from rubbing on his pants. Electronic Signature(s) Signed: 11/30/2018 11:30:14 PM By: Worthy Keeler PA-C Entered By: Worthy Keeler on 11/29/2018 06:48:39 Rivere, [name]  ([id number]) -------------------------------------------------------------------------------- ROS/PFSH Details Patient Name: [name]. Date of Service: 11/27/2018 2:00 PM Medical Record Number: [id number] Patient Account Number: [id number] Date of Birth/Sex: 12-24-1940 (77 y.o. M) Treating RN: Army Melia Primary Care Provider: Tedra Senegal Other Clinician: Referring Provider: Tedra Senegal Treating Provider/Extender: [name], Elorah Dewing Weeks in Treatment: 11 Information Obtained From Patient Wound History Do you currently have one or more open woundso Yes How many open wounds do you currently haveo 2 Approximately how long have you had your woundso 2 weeks How have you been treating your wound(s) until nowo zinc ,drsg Has your wound(s) ever healed and then re-openedo No Have you had any lab work done in the past montho Yes Who ordered the lab work doneo [facility] doctor [facility] Have you tested positive for an antibiotic resistant organism (MRSA, VRE)o No Have you tested positive for osteomyelitis (bone infection)o No Have you had any tests for circulation on your legso No Constitutional Symptoms (General Health) Complaints and Symptoms: Negative for: Fever; Chills Eyes Medical History: Negative for: Cataracts; Glaucoma; Optic Neuritis Ear/Nose/Mouth/Throat Medical History: Negative for: Chronic sinus problems/congestion; Middle ear problems Hematologic/Lymphatic Medical History: Positive for: Anemia Negative for: Hemophilia; Human Immunodeficiency Virus; Lymphedema; Sickle Cell Disease Respiratory Complaints and Symptoms: No Complaints or Symptoms Cardiovascular Complaints and Symptoms: No Complaints or Symptoms Medical History: Positive for: Hypertension; Peripheral Venous Disease Negative for: Angina; Arrhythmia; Congestive Heart Failure; Coronary Artery Disease; Deep Vein Thrombosis; Hypotension; Myocardial Infarction; Peripheral Arterial Disease; Phlebitis;  Vasculitis Chaires, ALEXANDER AUMENT ([id number]) Past Medical History Notes: artificial valve, hyperlipidemia Gastrointestinal Medical History: Negative for: Cirrhosis ; Colitis; Crohnos; Hepatitis A; Hepatitis B; Hepatitis C Past Medical History Notes: scar tissue from multiple surgeries Endocrine Medical History: Positive for: Type II Diabetes Negative for: Type I Diabetes Treated with: Oral agents Genitourinary Medical History: 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 Immunological Medical History: Negative for: Lupus Erythematosus; Raynaudos; Scleroderma Integumentary (Skin) Medical History: Negative for: History of Burn; History of pressure wounds Musculoskeletal Medical History: Positive for: Gout Negative for: Rheumatoid Arthritis; Osteoarthritis; Osteomyelitis Neurologic Medical History: Positive for: Neuropathy Negative for: Dementia; Quadriplegia; Paraplegia; Seizure Disorder Oncologic Medical History: Negative for: Received Chemotherapy; Received Radiation Psychiatric Complaints and Symptoms: No Complaints or Symptoms Medical History: Negative for: Anorexia/bulimia; Confinement Anxiety Immunizations Claw, [name] ([id number]) Pneumococcal Vaccine: Received Pneumococcal Vaccination: Yes Immunization Notes: uo to date  Implantable Devices No devices added Family and Social History Cancer: No; Diabetes: Yes - Father,Paternal Grandparents; Heart Disease: Yes - Father; Hereditary Spherocytosis: No; Hypertension: Yes - Father; Kidney Disease: No; Lung Disease: No; Seizures: No; Stroke: No; Thyroid Problems: No; Tuberculosis: No; Former smoker - quit in 1996; Marital Status - Married; Alcohol Use: Never - quit in 1976; Drug Use: No History; Caffeine Use: Never; Financial Concerns: No; Food, Clothing or Shelter Needs: No; Support System Lacking: No; Transportation Concerns: No; Advanced Directives: No; Patient does not want information on Advanced  Directives Physician Affirmation I have reviewed and agree with the above information. Electronic Signature(s) Signed: 11/30/2018 11:30:14 PM By: Worthy Keeler PA-C Signed: 12/01/2018 8:21:26 AM By: Army Melia Entered By: Worthy Keeler on 11/29/2018 06:47:04 Biswas, [name] ([id number]) -------------------------------------------------------------------------------- SuperBill Details Patient Name: [name]. Date of Service: 11/27/2018 Medical Record Number: [id number] Patient Account Number: [id number] Date of Birth/Sex: [birth year] (78 y.o. M) Treating RN: Army Melia Primary Care Provider: Tedra Senegal Other Clinician: Referring Provider: Tedra Senegal Treating Provider/Extender: [name], Davari Lopes Weeks in Treatment: 11 Diagnosis Coding ICD-10 Codes Code Description E83.59 Other disorders of calcium metabolism L98.492 Non-pressure chronic ulcer of skin of other sites with fat layer exposed E11.622 Type 2 diabetes mellitus with other skin ulcer Z99.2 Dependence on renal dialysis Facility Procedures CPT4 Code: [id number] Description: [phone] - WOUND CARE VISIT-LEV 2 EST PT Modifier: Quantity: 1 Physician Procedures CPT4 Code: [id number] Description: 26378 - WC PHYS LEVEL 3 - EST PT ICD-10 Diagnosis Description L98.492 Non-pressure chronic ulcer of skin of other sites with fat l E83.59 Other disorders of calcium metabolism E11.622 Type 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

## 2018-12-03 DIAGNOSIS — N2581 Secondary hyperparathyroidism of renal origin: Secondary | ICD-10-CM | POA: Diagnosis not present

## 2018-12-03 DIAGNOSIS — D509 Iron deficiency anemia, unspecified: Secondary | ICD-10-CM | POA: Diagnosis not present

## 2018-12-03 DIAGNOSIS — D631 Anemia in chronic kidney disease: Secondary | ICD-10-CM | POA: Diagnosis not present

## 2018-12-03 DIAGNOSIS — E1129 Type 2 diabetes mellitus with other diabetic kidney complication: Secondary | ICD-10-CM | POA: Diagnosis not present

## 2018-12-03 DIAGNOSIS — N186 End stage renal disease: Secondary | ICD-10-CM | POA: Diagnosis not present

## 2018-12-05 ENCOUNTER — Telehealth: Payer: Self-pay | Admitting: Endocrinology

## 2018-12-05 DIAGNOSIS — N186 End stage renal disease: Secondary | ICD-10-CM | POA: Diagnosis not present

## 2018-12-05 DIAGNOSIS — N2581 Secondary hyperparathyroidism of renal origin: Secondary | ICD-10-CM | POA: Diagnosis not present

## 2018-12-05 DIAGNOSIS — D631 Anemia in chronic kidney disease: Secondary | ICD-10-CM | POA: Diagnosis not present

## 2018-12-05 DIAGNOSIS — E1129 Type 2 diabetes mellitus with other diabetic kidney complication: Secondary | ICD-10-CM | POA: Diagnosis not present

## 2018-12-05 DIAGNOSIS — D509 Iron deficiency anemia, unspecified: Secondary | ICD-10-CM | POA: Diagnosis not present

## 2018-12-05 NOTE — Telephone Encounter (Signed)
Patient's Wife stated that Dr Dwyane Dee prescribed Novolog for the patient that they are trying to get through the [place]. They are requesting office notes stating Why the patient is being changed from the [facility] to this.  n   Fax- [phone]

## 2018-12-05 NOTE — Telephone Encounter (Signed)
May send last note

## 2018-12-05 NOTE — Telephone Encounter (Signed)
Please advise 

## 2018-12-05 NOTE — Telephone Encounter (Signed)
Noted  

## 2018-12-08 DIAGNOSIS — D509 Iron deficiency anemia, unspecified: Secondary | ICD-10-CM | POA: Diagnosis not present

## 2018-12-08 DIAGNOSIS — E1129 Type 2 diabetes mellitus with other diabetic kidney complication: Secondary | ICD-10-CM | POA: Diagnosis not present

## 2018-12-08 DIAGNOSIS — D631 Anemia in chronic kidney disease: Secondary | ICD-10-CM | POA: Diagnosis not present

## 2018-12-08 DIAGNOSIS — N186 End stage renal disease: Secondary | ICD-10-CM | POA: Diagnosis not present

## 2018-12-08 DIAGNOSIS — N2581 Secondary hyperparathyroidism of renal origin: Secondary | ICD-10-CM | POA: Diagnosis not present

## 2018-12-10 DIAGNOSIS — N2581 Secondary hyperparathyroidism of renal origin: Secondary | ICD-10-CM | POA: Diagnosis not present

## 2018-12-10 DIAGNOSIS — E1129 Type 2 diabetes mellitus with other diabetic kidney complication: Secondary | ICD-10-CM | POA: Diagnosis not present

## 2018-12-10 DIAGNOSIS — N186 End stage renal disease: Secondary | ICD-10-CM | POA: Diagnosis not present

## 2018-12-10 DIAGNOSIS — D631 Anemia in chronic kidney disease: Secondary | ICD-10-CM | POA: Diagnosis not present

## 2018-12-10 DIAGNOSIS — D509 Iron deficiency anemia, unspecified: Secondary | ICD-10-CM | POA: Diagnosis not present

## 2018-12-12 DIAGNOSIS — N186 End stage renal disease: Secondary | ICD-10-CM | POA: Diagnosis not present

## 2018-12-12 DIAGNOSIS — D631 Anemia in chronic kidney disease: Secondary | ICD-10-CM | POA: Diagnosis not present

## 2018-12-12 DIAGNOSIS — E1129 Type 2 diabetes mellitus with other diabetic kidney complication: Secondary | ICD-10-CM | POA: Diagnosis not present

## 2018-12-12 DIAGNOSIS — D509 Iron deficiency anemia, unspecified: Secondary | ICD-10-CM | POA: Diagnosis not present

## 2018-12-12 DIAGNOSIS — N2581 Secondary hyperparathyroidism of renal origin: Secondary | ICD-10-CM | POA: Diagnosis not present

## 2018-12-15 DIAGNOSIS — N2581 Secondary hyperparathyroidism of renal origin: Secondary | ICD-10-CM | POA: Diagnosis not present

## 2018-12-15 DIAGNOSIS — D509 Iron deficiency anemia, unspecified: Secondary | ICD-10-CM | POA: Diagnosis not present

## 2018-12-15 DIAGNOSIS — N186 End stage renal disease: Secondary | ICD-10-CM | POA: Diagnosis not present

## 2018-12-15 DIAGNOSIS — D631 Anemia in chronic kidney disease: Secondary | ICD-10-CM | POA: Diagnosis not present

## 2018-12-15 DIAGNOSIS — E1129 Type 2 diabetes mellitus with other diabetic kidney complication: Secondary | ICD-10-CM | POA: Diagnosis not present

## 2018-12-16 DIAGNOSIS — N186 End stage renal disease: Secondary | ICD-10-CM | POA: Diagnosis not present

## 2018-12-16 DIAGNOSIS — M6281 Muscle weakness (generalized): Secondary | ICD-10-CM | POA: Diagnosis not present

## 2018-12-16 DIAGNOSIS — E1122 Type 2 diabetes mellitus with diabetic chronic kidney disease: Secondary | ICD-10-CM | POA: Diagnosis not present

## 2018-12-17 DIAGNOSIS — N2581 Secondary hyperparathyroidism of renal origin: Secondary | ICD-10-CM | POA: Diagnosis not present

## 2018-12-17 DIAGNOSIS — N186 End stage renal disease: Secondary | ICD-10-CM | POA: Diagnosis not present

## 2018-12-17 DIAGNOSIS — D631 Anemia in chronic kidney disease: Secondary | ICD-10-CM | POA: Diagnosis not present

## 2018-12-17 DIAGNOSIS — D509 Iron deficiency anemia, unspecified: Secondary | ICD-10-CM | POA: Diagnosis not present

## 2018-12-17 DIAGNOSIS — E1129 Type 2 diabetes mellitus with other diabetic kidney complication: Secondary | ICD-10-CM | POA: Diagnosis not present

## 2018-12-19 DIAGNOSIS — D509 Iron deficiency anemia, unspecified: Secondary | ICD-10-CM | POA: Diagnosis not present

## 2018-12-19 DIAGNOSIS — E1129 Type 2 diabetes mellitus with other diabetic kidney complication: Secondary | ICD-10-CM | POA: Diagnosis not present

## 2018-12-19 DIAGNOSIS — N2581 Secondary hyperparathyroidism of renal origin: Secondary | ICD-10-CM | POA: Diagnosis not present

## 2018-12-19 DIAGNOSIS — D631 Anemia in chronic kidney disease: Secondary | ICD-10-CM | POA: Diagnosis not present

## 2018-12-19 DIAGNOSIS — N186 End stage renal disease: Secondary | ICD-10-CM | POA: Diagnosis not present

## 2018-12-21 NOTE — Progress Notes (Signed)
   Subjective:    Patient ID: [name], male    DOB: 09-20-1941, 78 y.o.   MRN: [id number]  HPI He is here today for hospitalization follow-up.  He presented to the emergency department with melena.  He was admitted February 26 through February 28.  Dr. Rush Landmark saw him during the hospitalization.  He had small area in the cardia with evidence of recent bleeding that was treated with argon plasma.  Another couple of areas noted in duodenal bulb.  These were treated with argon plasma.  He stabilized and was discharged.  CBC on day of admission was 8.3 g but dropped to 7.6 g and iron level was 21.  He was transfused 1 unit of packed red blood cells.  Creatinine was 7.32.  On discharge creatinine had improved to 4.03.  Hemoglobin today is now 11.1 g with MCV 99.1.  He is on oral Protonix twice daily.  He has chronic thrombocytopenia.  He has history of bilateral subdural hematomas and would like follow-up on that so he is scheduled for CT without contrast.  Review of Systems see above says she does not feel well in general and is somewhat irritable.     Objective:   Physical Exam Neck is supple.  Chest clear to auscultation.  Cardiac exam regular rate and rhythm.  He is alert.  He has dementia.  He is somewhat anxious.  Trace lower extremity edema.  He is accompanied by his wife who is supportive.  He continues to reside at home with minimal help.       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  Presented as melena.  Continue with daily iron supplement.  End-stage renal disease on hemodialysis  Hypothyroidism  Essential hypertension  Hyperlipidemia  Dementia  Type 2 diabetes mellitus  Plan: CBC repeated today and shows hemoglobin 11.1 g with MCV of 99.1.  He will continue to be followed closely by nephrologist.

## 2018-12-21 NOTE — Patient Instructions (Signed)
Take iron supplement once daily.  Continue close follow-up with nephrologist.

## 2018-12-22 DIAGNOSIS — E1129 Type 2 diabetes mellitus with other diabetic kidney complication: Secondary | ICD-10-CM | POA: Diagnosis not present

## 2018-12-22 DIAGNOSIS — D631 Anemia in chronic kidney disease: Secondary | ICD-10-CM | POA: Diagnosis not present

## 2018-12-22 DIAGNOSIS — N186 End stage renal disease: Secondary | ICD-10-CM | POA: Diagnosis not present

## 2018-12-22 DIAGNOSIS — N2581 Secondary hyperparathyroidism of renal origin: Secondary | ICD-10-CM | POA: Diagnosis not present

## 2018-12-22 DIAGNOSIS — D509 Iron deficiency anemia, unspecified: Secondary | ICD-10-CM | POA: Diagnosis not present

## 2018-12-23 ENCOUNTER — Telehealth: Payer: Self-pay | Admitting: Endocrinology

## 2018-12-23 NOTE — Telephone Encounter (Signed)
Patients wife has called stating that her husband will be going into dialysis and due to his state he will not be able to give himself the injection for his insulin and the nurses stated they can not either. Please Advise what they need to do about his dosage. Thanks

## 2018-12-24 DIAGNOSIS — E1129 Type 2 diabetes mellitus with other diabetic kidney complication: Secondary | ICD-10-CM | POA: Diagnosis not present

## 2018-12-24 DIAGNOSIS — N186 End stage renal disease: Secondary | ICD-10-CM | POA: Diagnosis not present

## 2018-12-24 DIAGNOSIS — N2581 Secondary hyperparathyroidism of renal origin: Secondary | ICD-10-CM | POA: Diagnosis not present

## 2018-12-24 DIAGNOSIS — Z992 Dependence on renal dialysis: Secondary | ICD-10-CM | POA: Diagnosis not present

## 2018-12-24 DIAGNOSIS — D631 Anemia in chronic kidney disease: Secondary | ICD-10-CM | POA: Diagnosis not present

## 2018-12-24 DIAGNOSIS — D509 Iron deficiency anemia, unspecified: Secondary | ICD-10-CM | POA: Diagnosis not present

## 2018-12-24 DIAGNOSIS — E1122 Type 2 diabetes mellitus with diabetic chronic kidney disease: Secondary | ICD-10-CM | POA: Diagnosis not present

## 2018-12-24 NOTE — Telephone Encounter (Signed)
He should be able to at least do it at dinnertime.  Otherwise we will need to put him back on Victoza

## 2018-12-24 NOTE — Telephone Encounter (Signed)
FYI

## 2018-12-25 ENCOUNTER — Other Ambulatory Visit: Payer: Medicare Other

## 2018-12-25 ENCOUNTER — Ambulatory Visit
Admission: RE | Admit: 2018-12-25 | Discharge: 2018-12-25 | Disposition: A | Discharge status: Home / Self Care | Payer: Medicare Other | Source: Ambulatory Visit | Attending: Neurological Surgery | Admitting: Neurological Surgery

## 2018-12-25 ENCOUNTER — Other Ambulatory Visit: Payer: Self-pay | Admitting: Neurological Surgery

## 2018-12-25 ENCOUNTER — Other Ambulatory Visit: Payer: Self-pay

## 2018-12-25 DIAGNOSIS — S065X9A Traumatic subdural hemorrhage with loss of consciousness of unspecified duration, initial encounter: Secondary | ICD-10-CM | POA: Diagnosis not present

## 2018-12-25 DIAGNOSIS — S065XAA Traumatic subdural hemorrhage with loss of consciousness status unknown, initial encounter: Secondary | ICD-10-CM

## 2018-12-25 NOTE — Telephone Encounter (Signed)
Called pt and gave her MD message. Pt stated that blood sugars before dinner have been in the mid 200's since he cannot do the lunch time insulin on M-W-F due to dialysis.

## 2018-12-25 NOTE — Telephone Encounter (Signed)
He can try to take at least 4 units right after he comes back from dialysis

## 2018-12-25 NOTE — Telephone Encounter (Signed)
Called pt's wife and left detailed voicemail with MD message.

## 2018-12-26 ENCOUNTER — Ambulatory Visit: Payer: Medicare Other | Admitting: Gastroenterology

## 2018-12-26 DIAGNOSIS — E1129 Type 2 diabetes mellitus with other diabetic kidney complication: Secondary | ICD-10-CM | POA: Diagnosis not present

## 2018-12-26 DIAGNOSIS — D509 Iron deficiency anemia, unspecified: Secondary | ICD-10-CM | POA: Diagnosis not present

## 2018-12-26 DIAGNOSIS — N2581 Secondary hyperparathyroidism of renal origin: Secondary | ICD-10-CM | POA: Diagnosis not present

## 2018-12-26 DIAGNOSIS — N186 End stage renal disease: Secondary | ICD-10-CM | POA: Diagnosis not present

## 2018-12-26 DIAGNOSIS — D631 Anemia in chronic kidney disease: Secondary | ICD-10-CM | POA: Diagnosis not present

## 2018-12-29 DIAGNOSIS — N2581 Secondary hyperparathyroidism of renal origin: Secondary | ICD-10-CM | POA: Diagnosis not present

## 2018-12-29 DIAGNOSIS — N186 End stage renal disease: Secondary | ICD-10-CM | POA: Diagnosis not present

## 2018-12-29 DIAGNOSIS — D631 Anemia in chronic kidney disease: Secondary | ICD-10-CM | POA: Diagnosis not present

## 2018-12-29 DIAGNOSIS — E1129 Type 2 diabetes mellitus with other diabetic kidney complication: Secondary | ICD-10-CM | POA: Diagnosis not present

## 2018-12-29 DIAGNOSIS — D509 Iron deficiency anemia, unspecified: Secondary | ICD-10-CM | POA: Diagnosis not present

## 2018-12-30 ENCOUNTER — Other Ambulatory Visit: Payer: Self-pay

## 2018-12-30 ENCOUNTER — Ambulatory Visit: Payer: Medicare Other | Admitting: Gastroenterology

## 2018-12-30 ENCOUNTER — Telehealth (INDEPENDENT_AMBULATORY_CARE_PROVIDER_SITE_OTHER): Payer: Medicare Other | Admitting: Gastroenterology

## 2018-12-30 ENCOUNTER — Encounter: Payer: Self-pay | Admitting: Gastroenterology

## 2018-12-30 VITALS — Ht 74.0 in | Wt 146.0 lb

## 2018-12-30 DIAGNOSIS — S065X9A Traumatic subdural hemorrhage with loss of consciousness of unspecified duration, initial encounter: Secondary | ICD-10-CM | POA: Diagnosis not present

## 2018-12-30 DIAGNOSIS — K5520 Angiodysplasia of colon without hemorrhage: Secondary | ICD-10-CM

## 2018-12-30 DIAGNOSIS — T82898A Other specified complication of vascular prosthetic devices, implants and grafts, initial encounter: Secondary | ICD-10-CM | POA: Diagnosis not present

## 2018-12-30 DIAGNOSIS — N186 End stage renal disease: Secondary | ICD-10-CM | POA: Diagnosis not present

## 2018-12-30 DIAGNOSIS — Z992 Dependence on renal dialysis: Secondary | ICD-10-CM

## 2018-12-30 DIAGNOSIS — D500 Iron deficiency anemia secondary to blood loss (chronic): Secondary | ICD-10-CM | POA: Diagnosis not present

## 2018-12-30 DIAGNOSIS — K31819 Angiodysplasia of stomach and duodenum without bleeding: Secondary | ICD-10-CM | POA: Diagnosis not present

## 2018-12-30 DIAGNOSIS — D631 Anemia in chronic kidney disease: Secondary | ICD-10-CM | POA: Diagnosis not present

## 2018-12-30 DIAGNOSIS — K5909 Other constipation: Secondary | ICD-10-CM

## 2018-12-30 DIAGNOSIS — Z860100 Personal history of colon polyps, unspecified: Secondary | ICD-10-CM

## 2018-12-30 DIAGNOSIS — K219 Gastro-esophageal reflux disease without esophagitis: Secondary | ICD-10-CM | POA: Diagnosis not present

## 2018-12-30 DIAGNOSIS — S065XAA Traumatic subdural hemorrhage with loss of consciousness status unknown, initial encounter: Secondary | ICD-10-CM

## 2018-12-30 NOTE — Progress Notes (Signed)
Chief Complaint: Small bowel AVMs  Referring Provider:     Elby Showers, MD   HPI:    Due to current restrictions/limitations of in-office visits due to the COVID-19 pandemic, this scheduled clinical appointment was converted to a telehealth virtual consultation.  -Time of medical discussion: 27 minutes -The patient did consent to this virtual visit and is aware of possible charges through their insurance for this visit.  -Names of all parties present: [name] (patient), [name] (patient's wife), Gerrit Heck, DO, [facility] (physician) -Patient location: Home -Physician location: Office  ZAKARIE STURDIVANT is a 78 y.o. male with a history of ESRD on hemodialysis, CAD, HTN, AAA repair 1992, DM II, Hypothyroidism, GERD, subdural hematoma  S/p fall requiring subdural evacuation in 06/2017, diverticulitis, HX of UGI bleed 05/2018 (gastric AVMs) presenting to the [facility]ology [facility] for follow-up from hospitalization in 10/2018.  He was readmitted for melena and LLQ pain, with admission hemoglobin 8.3 (hospital nadir 7.6), MCV 105.7, FOBT positive stool.  CT unrevealing, no diverticulitis.  Transfused 1 unit PRBCs.  Small bowel enteroscopy completed 11/20/2018 and n/f bleeding in gastric cardia (APC), AVM in gastric fundus (APC), gastritis, oozing x2 in the duodenal bulb (APC), 3 AVMs in proximal jejunum (APC).  Discharged on 11/21/2018 with recommendation for outpatient colonoscopy and possible VCE.  Discharge hemoglobin 8.6.  Repeat H/H on 11/27/2018 was 11.1/34.6.Patient has labs at home that are dated late March 2020; Hgb 10.6. Had IV iron infusion with HD yesterday.   Today he states he has seen black stool on occasion since hospital discharge; currently brown stools. No abdominal pain, n/v/f/c. Stools generally formed/soft but increased gas lately. Takes Miralax daily for hx of constipation. Otherwise, tolerating PO intake without issues. Went to vascular surgeon this week  to open AV fistula up. Has an appt with Neurosurgery this week and to discuss restarting antiplatelet therapy.   Wife assists in history given memory difficulties after neurosurgery in 06/2018 for hematoma evacuation.   Endoscopic History: 11/20/2018 Small Bowel Enteroscopy: gastric cardia (APC), AVM in gastric fundus (APC), gastritis, oozing x2 in the duodenal bulb (APC), 3 AVMs in proximal jejunum (APC).  Tattoo placed at distal extent in jejunum.  06/03/2018 Small Bowel Enteroscopy: no evidence of significant pathology in the duodenal bulb, in the first portion of the duodenum, in the second portion of the duodenum, in the third portion of the duodenum and in the fourth portion of the duodenum. The examined esophagus was normal. A single 3 mm stigmata of recent bleeding angioectasia was found in the gastric fundus. Coagulation for hemostasis using argon plasma at 1 liter/minute and 20 watts was successful.  EGD 11/12/2017: Mild distal esophageal stricture which was dilated otherwise negative exam. No evidence of H. Pylori.  Colonoscopy 11/12/2017: A 9 mm TA polyp was found in the transverse colon.  A 1 mm hyperplastic polyp was found in the recto-sigmoid colon. A single localized non-bleeding erosion was found in the transverse colon. No stigmata of recent bleeding was seen. A few small and large-mouthed diverticula were found in the sigmoid colon, descending colon, transverse colon, hepatic flexure and ascending colon. Non-bleeding internal hemorrhoids were found during retroflexion. The hemorrhoids were small.  Colonoscopy 10/28/2008: Right-sided and sigmoid diverticulosis.  Colonoscopy 06/08/2003: Diverticulosis  Past medical history, past surgical history, social history, family history, medications, and allergies reviewed in the chart and with patient over Zoom.   Past Medical History:  Diagnosis Date  . Allergy   . Anemia   . Arthritis   . Cataract    bil cateracts removed  . Coronary  artery disease   . Dementia arising in the senium and presenium ([place])   . Diabetes mellitus    Type 2  . Diverticulitis   . ED (erectile dysfunction)   . 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 ESRD (end stage renal disease) on dialysis ([place]) 03/2015  . GERD (gastroesophageal reflux disease)    pepto   . Gout   . Hyperlipidemia   . Hypertension   . Hypothyroidism   . Pneumonia   . PVD (peripheral vascular disease) ([place])    has plastic aorta  . Seasonal allergies   . Sleep apnea    does not wear c-pap     Past Surgical History:  Procedure Laterality Date  . aortobifemoral bypass    . AV FISTULA PLACEMENT Left 12/01/2013   Procedure: ARTERIOVENOUS (AV) FISTULA CREATION- LEFT BRACHIOCEPHALIC;  Surgeon: Angelia Mould, MD;  Location: [place];  Service: Vascular;  Laterality: Left;  . [place] TRANSPOSITION Right 07/27/2014   Procedure: BASCILIC VEIN TRANSPOSITION;  Surgeon: Angelia Mould, MD;  Location: [place];  Service: Vascular;  Laterality: Right;  . BREAST SURGERY     left - granulomatous mastitis  . BURR HOLE Bilateral 07/22/2018   Procedure: BILATERAL BURR HOLES;  Surgeon: Kristeen Miss, MD;  Location: [place];  Service: Neurosurgery;  Laterality: Bilateral;  . COLONOSCOPY    . 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 ([place] HX)  1992  . ENTEROSCOPY N/A 06/03/2018   Procedure: ENTEROSCOPY;  Surgeon: Lavena Bullion, DO;  Location: [place];  Service: Gastroenterology;  Laterality: N/A;  . ENTEROSCOPY N/A 11/20/2018   Procedure: ENTEROSCOPY;  Surgeon: Rush Landmark Telford Nab., MD;  Location: [facility] ENDOSCOPY;  Service: Gastroenterology;  Laterality: N/A;  . EYE SURGERY Bilateral    cataracts  . HEMODIALYSIS INPATIENT  01/17/2018      . HOT HEMOSTASIS N/A 06/03/2018   Procedure: HOT HEMOSTASIS (ARGON PLASMA COAGULATION/BICAP);  Surgeon: Lavena Bullion, DO;  Location[facility] ENDOSCOPY;  Service: Gastroenterology;  Laterality: N/A;  . HOT HEMOSTASIS N/A 11/20/2018   Procedure:  HOT HEMOSTASIS (ARGON PLASMA COAGULATION/BICAP);  Surgeon: Irving Copas., MD;  Location: [place];  Service: Gastroenterology;  Laterality: N/A;  . REVISON OF ARTERIOVENOUS FISTULA Left 02/09/2014   Procedure: REVISON OF LEFT ARTERIOVENOUS FISTULA - RESECTION OF RENDUNDANT VEIN;  Surgeon: Angelia Mould, MD;  Location: [place];  Service: Vascular;  Laterality: Left;  . SBO with lysis adhesions    . SHUNTOGRAM Left 04/19/2014   Procedure: FISTULOGRAM;  Surgeon: Angelia Mould, MD;  Location[facility] CATH LAB;  Service: Cardiovascular;  Laterality: Left;  . UNILATERAL UPPER EXTREMEITY ANGIOGRAM N/A 07/12/2014   Procedure: UNILATERAL UPPER Anselmo Rod;  Surgeon: Angelia Mould, MD;  Location: [facility] CATH LAB;  Service: Cardiovascular;  Laterality: N/A;   Family History  Problem Relation Age of Onset  . Aneurysm Mother   . Heart disease Father   . Stroke Father   . Hypertension Father   . Diabetes Father   . Dementia Neg Hx   . Colon cancer Neg Hx   . Esophageal cancer Neg Hx   . Pancreatic cancer Neg Hx   . Prostate cancer Neg Hx   . Rectal cancer Neg Hx   . Stomach cancer Neg Hx    Social History   Tobacco Use  . Smoking  status: Former Smoker    Last attempt to quit: 09/24/1994    Years since quitting: 24.2  . Smokeless tobacco: Never Used  Substance Use Topics  . Alcohol use: No    Alcohol/week: 0.0 standard drinks    Comment: Quit Oct. 1977 ("somewhat heavy")  . Drug use: No   Current Outpatient Medications  Medication Sig Dispense Refill  . acetaminophen (TYLENOL) 325 MG tablet Take 1-2 tablets (325-650 mg total) by mouth every 4 (four) hours as needed for mild pain.    Marland Kitchen allopurinol (ZYLOPRIM) 100 MG tablet Take 100 mg by mouth daily.     . Blood Glucose Monitoring Suppl (FREESTYLE FREEDOM LITE) w/Device KIT Use to check blood sugar 2 times per day dx code E11.65 1 each 0  . Carboxymethylcellulose Sod PF 0.25 % SOLN Place 1 drop into both eyes  as needed (dry eyes).    . clobetasol (TEMOVATE) 0.05 % external solution Apply 1 application topically 2 (two) times daily as needed (irritation). Apply to scalp  0  . denosumab (PROLIA) 60 MG/ML SOSY injection Inject 60 mg into the skin every 6 (six) months.    . docusate sodium (COLACE) 100 MG capsule Take 1 capsule (100 mg total) by mouth daily. (Patient taking differently: Take 100 mg by mouth daily as needed for mild constipation. ) 30 capsule 0  . FLUOCINOLONE ACETONIDE SCALP 0.01 % OIL Apply 1 application topically at bedtime. Apply to scalp  0  . fluticasone (CUTIVATE) 0.05 % cream Apply 1 application topically 2 (two) times daily as needed (irritation). Apply to face  0  . fluticasone (FLONASE) 50 MCG/ACT nasal spray Place 1 spray into both nostrils daily. (Patient taking differently: Place 1 spray into both nostrils daily as needed for allergies. ) 16 g 2  . glucose blood (FREESTYLE LITE) test strip Use as instructed to check blood sugar 3 times daily. 200 each 12  . insulin aspart (NOVOLOG FLEXPEN) 100 UNIT/ML FlexPen INJECT 6-8 UNITS UNDER THE SKIN THREE TIMES DAILY BEFORE MEALS. DX:E11.65 15 mL 12  . Insulin Pen Needle (PEN NEEDLES) 32G X 4 MM MISC 1 each by Does not apply route 3 (three) times daily. USE PEN NEEDLES TO INJECT INSULIN THREE TIMES DAILY. 100 each 12  . ketoconazole (NIZORAL) 2 % shampoo Apply 1 application topically 2 (two) times a week. (Patient taking differently: Apply 1 application topically at bedtime. ) 120 mL 0  . Lancets (FREESTYLE) lancets Use as instructed to check blood sugar 2 times per day dx code E11.65 100 each 3  . levothyroxine (SYNTHROID, LEVOTHROID) 50 MCG tablet Take 1 tablet (50 mcg total) by mouth daily. 90 tablet 1  . memantine (NAMENDA) 10 MG tablet TAKE 1 TABLET(10 MG) BY MOUTH TWICE DAILY 60 tablet 7  . midodrine (PROAMATINE) 10 MG tablet Take one pill prior to hemodialysis on MWF    . Naftifine HCl (NAFTIN) 2 % CREA Apply to feet daily for  fungal infection (Patient taking differently: Apply 1 application topically daily as needed (for fungal infection). Apply to feet) 60 g prn  . Nutritional Supplements (FEEDING SUPPLEMENT, NEPRO CARB STEADY,) LIQD Take 237 mLs by mouth daily.    Marland Kitchen omeprazole (PRILOSEC) 40 MG capsule Take 1 capsule (40 mg total) by mouth daily. 30 capsule 0  . polyethylene glycol (MIRALAX / GLYCOLAX) packet Take 17 g by mouth daily. (Patient taking differently: Take 17 g by mouth daily as needed for mild constipation. ) 14 each 0  . senna (  SENOKOT) 8.6 MG TABS tablet Take 1 tablet (8.6 mg total) by mouth at bedtime. (Patient taking differently: Take 1 tablet by mouth daily as needed for moderate constipation. ) 120 each 0  . sevelamer carbonate (RENVELA) 800 MG tablet Take 1,600 mg by mouth 3 (three) times daily before meals.     . simvastatin (ZOCOR) 20 MG tablet Take 20 mg by mouth at bedtime.      No current facility-administered medications for this visit.    Allergies  Allergen Reactions  . Ambien [Zolpidem Tartrate] Other (See Comments)    hallucinations and "felt crazy"   . Penicillins Rash    Has patient had a PCN reaction causing immediate rash, facial/tongue/throat swelling, SOB or lightheadedness with hypotension: Yes Has patient had a PCN reaction causing severe rash involving mucus membranes or skin necrosis: Yes Has patient had a PCN reaction that required hospitalization: No Has patient had a PCN reaction occurring within the last 10 years: No If all of the above answers are "NO", then may proceed with Cephalosporin use.      Review of Systems: All systems reviewed and negative except where noted in HPI.     Physical Exam:    Physical exam not completed due to the nature of this telehealth communication.  Patient was otherwise alert and oriented and well communicative.   ASSESSMENT AND PLAN;   1) Small Bowel and Gastric AVMs: History of small bowel AVMs requiring admissions in the  past for PRBC transfusions and endoscopic intervention, most recently in 10/2018.  Has been doing well since hospital discharge, with most recent hemoglobin 11.9 and 10.6 last month.  Additionally, treated with IV iron at HD.  Discussed further evaluation and treatment options as outlined below:  - Plan for VCE when available pending COVID-19 pandemic restrictions - Holding off on repeat colonoscopy for now, as this was done in 2019 and without colonic AVMs -Continue serial H/H checks with HD and IV iron infusion per protocol - Did discuss treatment with octreotide, which he would like to forego owing to the side effect profile, which seems reasonable.  Additionally, we discussed trial option of thalidomide, which he also politely declined, in favor of endoscopic evaluation and IV iron with PRBC transfusions as needed.  These seem a reasonable decision. - If VCE positive for additional lesions within reach, may need to consider single balloon enteroscopy as necessary - Ultimately, discussed the recurring nature of small bowel AVMs.  As above, discussed octreotide and thalidomide, which he politely declined.  Ultimately, given his added comorbidities, feel this would likely require ongoing serial H/H checks with IV iron and/for PRBC transfusions long-term.  He and his wife are both comfortable with this approach.  2) Chronic Anemia: Multifactorial secondary to ESRD and small bowel AVMs as above.  Treating as above.  3) ESRD on Hemodialysis: Receives regular HD.  Routine serial H/H checks at HD as above.  4) Chronic Constipation: History of constipation, which has been generally well controlled with daily MiraLAX.  Now endorsing increased gas with intermittent loose stools.  - Decrease MiraLAX to 3/4 cap daily, and continue to titrate to desired effect -Can add fiber supplement for some bulking  5) GERD: Well-controlled on current therapy.  No plan to change therapy at this time.  6) History of  Colon Polyps: Last colonoscopy in 10/2017 with 9 mm TA.  No plan for repeat colonoscopy for ongoing surveillance given age and comorbidities.  7) History of Subdural Hematoma: Has appoint with  neurosurgery this week.  To discuss resuming antiplatelet therapy.  Despite his history of gastric and small bowel AVMs, feel that the cardiovascular benefit likely outweighs the GI risk of restarting this therapy from a GI standpoint, but certainly defer decision to Neurosurgery given the history of hematoma and that specific risk/benefit profile.  I spent a total of 25 minutes of face-to-face time with the patient. Greater than 50% of the time was spent counseling and coordinating care.     Lavena Bullion, DO, FACG  12/30/2018, 10:32 AM   [place], Cresenciano Lick, MD

## 2018-12-31 DIAGNOSIS — E1129 Type 2 diabetes mellitus with other diabetic kidney complication: Secondary | ICD-10-CM | POA: Diagnosis not present

## 2018-12-31 DIAGNOSIS — N2581 Secondary hyperparathyroidism of renal origin: Secondary | ICD-10-CM | POA: Diagnosis not present

## 2018-12-31 DIAGNOSIS — N186 End stage renal disease: Secondary | ICD-10-CM | POA: Diagnosis not present

## 2018-12-31 DIAGNOSIS — D509 Iron deficiency anemia, unspecified: Secondary | ICD-10-CM | POA: Diagnosis not present

## 2018-12-31 DIAGNOSIS — D631 Anemia in chronic kidney disease: Secondary | ICD-10-CM | POA: Diagnosis not present

## 2019-01-02 DIAGNOSIS — N186 End stage renal disease: Secondary | ICD-10-CM | POA: Diagnosis not present

## 2019-01-02 DIAGNOSIS — N2581 Secondary hyperparathyroidism of renal origin: Secondary | ICD-10-CM | POA: Diagnosis not present

## 2019-01-02 DIAGNOSIS — D631 Anemia in chronic kidney disease: Secondary | ICD-10-CM | POA: Diagnosis not present

## 2019-01-02 DIAGNOSIS — E1129 Type 2 diabetes mellitus with other diabetic kidney complication: Secondary | ICD-10-CM | POA: Diagnosis not present

## 2019-01-02 DIAGNOSIS — D509 Iron deficiency anemia, unspecified: Secondary | ICD-10-CM | POA: Diagnosis not present

## 2019-01-05 DIAGNOSIS — N2581 Secondary hyperparathyroidism of renal origin: Secondary | ICD-10-CM | POA: Diagnosis not present

## 2019-01-05 DIAGNOSIS — E1129 Type 2 diabetes mellitus with other diabetic kidney complication: Secondary | ICD-10-CM | POA: Diagnosis not present

## 2019-01-05 DIAGNOSIS — D509 Iron deficiency anemia, unspecified: Secondary | ICD-10-CM | POA: Diagnosis not present

## 2019-01-05 DIAGNOSIS — D631 Anemia in chronic kidney disease: Secondary | ICD-10-CM | POA: Diagnosis not present

## 2019-01-05 DIAGNOSIS — N186 End stage renal disease: Secondary | ICD-10-CM | POA: Diagnosis not present

## 2019-01-07 DIAGNOSIS — D509 Iron deficiency anemia, unspecified: Secondary | ICD-10-CM | POA: Diagnosis not present

## 2019-01-07 DIAGNOSIS — N186 End stage renal disease: Secondary | ICD-10-CM | POA: Diagnosis not present

## 2019-01-07 DIAGNOSIS — E1129 Type 2 diabetes mellitus with other diabetic kidney complication: Secondary | ICD-10-CM | POA: Diagnosis not present

## 2019-01-07 DIAGNOSIS — N2581 Secondary hyperparathyroidism of renal origin: Secondary | ICD-10-CM | POA: Diagnosis not present

## 2019-01-07 DIAGNOSIS — D631 Anemia in chronic kidney disease: Secondary | ICD-10-CM | POA: Diagnosis not present

## 2019-01-08 ENCOUNTER — Ambulatory Visit: Payer: Medicare Other | Admitting: Physical Medicine & Rehabilitation

## 2019-01-08 ENCOUNTER — Ambulatory Visit: Payer: Medicare Other | Admitting: Gastroenterology

## 2019-01-09 DIAGNOSIS — D509 Iron deficiency anemia, unspecified: Secondary | ICD-10-CM | POA: Diagnosis not present

## 2019-01-09 DIAGNOSIS — N2581 Secondary hyperparathyroidism of renal origin: Secondary | ICD-10-CM | POA: Diagnosis not present

## 2019-01-09 DIAGNOSIS — N186 End stage renal disease: Secondary | ICD-10-CM | POA: Diagnosis not present

## 2019-01-09 DIAGNOSIS — D631 Anemia in chronic kidney disease: Secondary | ICD-10-CM | POA: Diagnosis not present

## 2019-01-09 DIAGNOSIS — E1129 Type 2 diabetes mellitus with other diabetic kidney complication: Secondary | ICD-10-CM | POA: Diagnosis not present

## 2019-01-12 DIAGNOSIS — E1129 Type 2 diabetes mellitus with other diabetic kidney complication: Secondary | ICD-10-CM | POA: Diagnosis not present

## 2019-01-12 DIAGNOSIS — N2581 Secondary hyperparathyroidism of renal origin: Secondary | ICD-10-CM | POA: Diagnosis not present

## 2019-01-12 DIAGNOSIS — D631 Anemia in chronic kidney disease: Secondary | ICD-10-CM | POA: Diagnosis not present

## 2019-01-12 DIAGNOSIS — N186 End stage renal disease: Secondary | ICD-10-CM | POA: Diagnosis not present

## 2019-01-12 DIAGNOSIS — D509 Iron deficiency anemia, unspecified: Secondary | ICD-10-CM | POA: Diagnosis not present

## 2019-01-13 ENCOUNTER — Other Ambulatory Visit (INDEPENDENT_AMBULATORY_CARE_PROVIDER_SITE_OTHER): Payer: Medicare Other

## 2019-01-13 ENCOUNTER — Other Ambulatory Visit: Payer: Self-pay

## 2019-01-13 DIAGNOSIS — E1165 Type 2 diabetes mellitus with hyperglycemia: Secondary | ICD-10-CM | POA: Diagnosis not present

## 2019-01-13 LAB — GLUCOSE, RANDOM: Glucose, Bld: 106 mg/dL — ABNORMAL HIGH (ref 70–99)

## 2019-01-14 DIAGNOSIS — D509 Iron deficiency anemia, unspecified: Secondary | ICD-10-CM | POA: Diagnosis not present

## 2019-01-14 DIAGNOSIS — N186 End stage renal disease: Secondary | ICD-10-CM | POA: Diagnosis not present

## 2019-01-14 DIAGNOSIS — N2581 Secondary hyperparathyroidism of renal origin: Secondary | ICD-10-CM | POA: Diagnosis not present

## 2019-01-14 DIAGNOSIS — D631 Anemia in chronic kidney disease: Secondary | ICD-10-CM | POA: Diagnosis not present

## 2019-01-14 DIAGNOSIS — E1129 Type 2 diabetes mellitus with other diabetic kidney complication: Secondary | ICD-10-CM | POA: Diagnosis not present

## 2019-01-14 LAB — FRUCTOSAMINE: Fructosamine: 292 umol/L — ABNORMAL HIGH (ref 0–285)

## 2019-01-15 ENCOUNTER — Other Ambulatory Visit: Payer: Self-pay

## 2019-01-15 ENCOUNTER — Encounter: Payer: Self-pay | Admitting: Endocrinology

## 2019-01-15 ENCOUNTER — Ambulatory Visit (INDEPENDENT_AMBULATORY_CARE_PROVIDER_SITE_OTHER): Payer: Medicare Other | Admitting: Endocrinology

## 2019-01-15 VITALS — BP 110/60 | HR 75 | Ht 74.0 in | Wt 150.0 lb

## 2019-01-15 DIAGNOSIS — E1165 Type 2 diabetes mellitus with hyperglycemia: Secondary | ICD-10-CM

## 2019-01-15 LAB — POCT GLYCOSYLATED HEMOGLOBIN (HGB A1C): Hemoglobin A1C: 5.3 % (ref 4.0–5.6)

## 2019-01-15 NOTE — Progress Notes (Addendum)
Patient ID: [name], male   DOB: [birth year], 78 y.o.   MRN: [id number]           Reason for Appointment:  Follow-up for endocrinology problems   History of Present Illness:          Date of diagnosis of type 2 diabetes mellitus:  1996      Background history:  He has had long-standing diabetes probably treated with metformin initially and subsequently with sulfonylurea drugs At some point he was also given Actos in addition to his glipizide which he is still taking Records of his control are only available for the last 4 years Over the last few years his A1c has been generally in the 6-7% range His A1c was 6.2 in May 2016 and apparently was starting to get low normal blood sugars at times He was admitted to the hospital for fluid overload and renal failure and at that time his Actos was stopped Subsequently his blood sugars had been markedly increased He was switched from regular insulin to NovoLog on his initial consultation  Recent history:    INSULIN regimen: NovoLog 4 units before breakfast and lunch and 6 units before dinner  His A1c is again about the same at 5.3, previously 5.4   Fructosamine is better at 292 compared to 344 previously  Current blood sugar patterns and problems identified:  He has been back on NovoLog insulin because of significant high blood sugar readings with Prandin and Tradjenta  Although he is eating some snacks at dialysis he is not eating a full meal and not taking his insulin with him, was not comfortable doing his own injection  His wife is doing his insulin now and appears to be giving it right before or right after eating based on his blood sugar level, she is not aware that his blood sugars are going up regardless of what his pre-meal blood sugar is  Not adjusting dose based on what he is eating  Also not checking blood sugars after meals  Rarely will have a low blood sugar before dinner and this has happened twice with lowest blood  sugar 55  However he says he is having a good appetite and usually eating full meals 3 times a day  At dialysis he is eating mostly peanut butter crackers for snacks   Dialysis is done at 12 pm, doing it on Monday/Wednesday/Fridays     Side effects from medications have been: None  Compliance with the medical regimen: Fair  Glucose monitoring:  done 3  times a day         Glucometer:  Freestyle  Blood Glucose readings by review of monitor: Download results as below   PRE-MEAL Fasting Lunch Dinner Bedtime Overall  Glucose range:  89-155  107-286  55-256  126, 234   Mean/median:  110  170  150  146   POST-MEAL PC Breakfast PC Lunch PC Dinner  Glucose range:  125, 192    Mean/median:      PREVIOUS readings:  PRE-MEAL Fasting Lunch Dinner Bedtime Overall  Glucose range:    76-315    Mean/median:  140     216   POST-MEAL PC Breakfast PC Lunch PC Dinner  Glucose range:   202-381  192-364  Mean/median:    284      Self-care: The diet that the patient has been following is: tries to limit sweets, portions. eating out only about once a week at a steak house  Typical meal intake: Breakfast is variable; supper 8 pm            Dietician visit, most recent: 08/2017               Exercise:  Not able to do any  Weight history:   Wt Readings from Last 3 Encounters:  01/15/19 150 lb (68 kg)  12/30/18 146 lb (66.2 kg)  11/27/18 147 lb (66.7 kg)    Glycemic control:    Lab Results  Component Value Date   HGBA1C 5.3 01/15/2019   HGBA1C 5.4 09/09/2018   HGBA1C 5.2 06/01/2018   Lab Results  Component Value Date   MICROALBUR 37.2 09/03/2016   LDLCALC 21 09/05/2017   CREATININE 4.03 (H) 11/21/2018    Lab Results  Component Value Date   FRUCTOSAMINE 292 (H) 01/13/2019   FRUCTOSAMINE 344 (H) 11/13/2018   FRUCTOSAMINE 295 (H) 09/09/2018    PROBLEM 2 Osteoporosis: See review of systems     Allergies as of 01/15/2019      Reactions   Ambien [zolpidem  Tartrate] Other (See Comments)   hallucinations and "felt crazy"    Penicillins Rash   Has patient had a PCN reaction causing immediate rash, facial/tongue/throat swelling, SOB or lightheadedness with hypotension: Yes Has patient had a PCN reaction causing severe rash involving mucus membranes or skin necrosis: Yes Has patient had a PCN reaction that required hospitalization: No Has patient had a PCN reaction occurring within the last 10 years: No If all of the above answers are "NO", then may proceed with Cephalosporin use.      Medication List       Accurate as of January 15, 2019  2:03 PM. Always use your most recent med list.        acetaminophen 325 MG tablet Commonly known as:  TYLENOL Take 1-2 tablets (325-650 mg total) by mouth every 4 (four) hours as needed for mild pain.   allopurinol 100 MG tablet Commonly known as:  ZYLOPRIM Take 100 mg by mouth daily.   Carboxymethylcellulose Sod PF 0.25 % Soln Place 1 drop into both eyes as needed (dry eyes).   clobetasol 0.05 % external solution Commonly known as:  TEMOVATE Apply 1 application topically 2 (two) times daily as needed (irritation). Apply to scalp   denosumab 60 MG/ML Sosy injection Commonly known as:  PROLIA Inject 60 mg into the skin every 6 (six) months.   docusate sodium 100 MG capsule Commonly known as:  COLACE Take 1 capsule (100 mg total) by mouth daily.   feeding supplement (NEPRO CARB STEADY) Liqd Take 237 mLs by mouth daily.   Fluocinolone Acetonide Scalp 0.01 % Oil Apply 1 application topically at bedtime. Apply to scalp   fluticasone 0.05 % cream Commonly known as:  CUTIVATE Apply 1 application topically 2 (two) times daily as needed (irritation). Apply to face   fluticasone 50 MCG/ACT nasal spray Commonly known as:  FLONASE Place 1 spray into both nostrils daily.   FreeStyle Freedom Lite w/Device Kit Use to check blood sugar 2 times per day dx code E11.65   freestyle lancets Use as  instructed to check blood sugar 2 times per day dx code E11.65   glucose blood test strip Commonly known as:  FREESTYLE LITE Use as instructed to check blood sugar 3 times daily.   insulin aspart 100 UNIT/ML FlexPen Commonly known as:  NovoLOG FlexPen INJECT 6-8 UNITS UNDER THE SKIN THREE TIMES DAILY BEFORE MEALS. DX:E11.65   ketoconazole  2 % shampoo Commonly known as:  Nizoral Apply 1 application topically 2 (two) times a week.   levothyroxine 50 MCG tablet Commonly known as:  SYNTHROID Take 1 tablet (50 mcg total) by mouth daily.   memantine 10 MG tablet Commonly known as:  NAMENDA TAKE 1 TABLET(10 MG) BY MOUTH TWICE DAILY   midodrine 10 MG tablet Commonly known as:  PROAMATINE Take one pill prior to hemodialysis on MWF   Naftifine HCl 2 % Crea Commonly known as:  Naftin Apply to feet daily for fungal infection   omeprazole 40 MG capsule Commonly known as:  PRILOSEC Take 1 capsule (40 mg total) by mouth daily.   Pen Needles 32G X 4 MM Misc 1 each by Does not apply route 3 (three) times daily. USE PEN NEEDLES TO INJECT INSULIN THREE TIMES DAILY.   polyethylene glycol 17 g packet Commonly known as:  MIRALAX / GLYCOLAX Take 17 g by mouth daily.   senna 8.6 MG Tabs tablet Commonly known as:  SENOKOT Take 1 tablet (8.6 mg total) by mouth at bedtime.   sevelamer carbonate 800 MG tablet Commonly known as:  RENVELA Take 1,600 mg by mouth 3 (three) times daily before meals.   simvastatin 20 MG tablet Commonly known as:  ZOCOR Take 20 mg by mouth at bedtime.       Allergies:  Allergies  Allergen Reactions  . Ambien [Zolpidem Tartrate] Other (See Comments)    hallucinations and "felt crazy"   . Penicillins Rash    Has patient had a PCN reaction causing immediate rash, facial/tongue/throat swelling, SOB or lightheadedness with hypotension: Yes Has patient had a PCN reaction causing severe rash involving mucus membranes or skin necrosis: Yes Has patient had a PCN  reaction that required hospitalization: No Has patient had a PCN reaction occurring within the last 10 years: No If all of the above answers are "NO", then may proceed with Cephalosporin use.     Past Medical History:  Diagnosis Date  . Allergy   . Anemia   . Arthritis   . Cataract    bil cateracts removed  . Coronary artery disease   . Dementia arising in the senium and presenium ([place])   . Diabetes mellitus    Type 2  . Diverticulitis   . ED (erectile dysfunction)   . Elevated homocysteine ([place])   . ESRD (end stage renal disease) on dialysis ([place]) 03/2015  . GERD (gastroesophageal reflux disease)    pepto   . Gout   . Hyperlipidemia   . Hypertension   . Hypothyroidism   . Pneumonia   . PVD (peripheral vascular disease) ([place])    has plastic aorta  . Seasonal allergies   . Sleep apnea    does not wear c-pap    Past Surgical History:  Procedure Laterality Date  . aortobifemoral bypass    . AV FISTULA PLACEMENT Left 12/01/2013   Procedure: ARTERIOVENOUS (AV) FISTULA CREATION- LEFT BRACHIOCEPHALIC;  Surgeon: Angelia Mould, MD;  Location: [place];  Service: Vascular;  Laterality: Left;  . [place] TRANSPOSITION Right 07/27/2014   Procedure: BASCILIC VEIN TRANSPOSITION;  Surgeon: Angelia Mould, MD;  Location: [place];  Service: Vascular;  Laterality: Right;  . BRAIN SURGERY  07/22/2018  . BREAST SURGERY     left - granulomatous mastitis  . BURR HOLE Bilateral 07/22/2018   Procedure: BILATERAL BURR HOLES;  Surgeon: Kristeen Miss, MD;  Location: [place];  Service: Neurosurgery;  Laterality: Bilateral;  .  COLONOSCOPY    . 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 ENTEROSCOPY N/A 06/03/2018   Procedure: ENTEROSCOPY;  Surgeon: Lavena Bullion, DO;  Location: [place];  Service: Gastroenterology;  Laterality: N/A;  . ENTEROSCOPY N/A 11/20/2018   Procedure: ENTEROSCOPY;  Surgeon: Rush Landmark Telford Nab., MD;  Location: [facility] ENDOSCOPY;  Service:  Gastroenterology;  Laterality: N/A;  . EYE SURGERY Bilateral    cataracts  . HEMODIALYSIS INPATIENT  01/17/2018      . HOT HEMOSTASIS N/A 06/03/2018   Procedure: HOT HEMOSTASIS (ARGON PLASMA COAGULATION/BICAP);  Surgeon: Lavena Bullion, DO;  Location: [facility] ENDOSCOPY;  Service: Gastroenterology;  Laterality: N/A;  . HOT HEMOSTASIS N/A 11/20/2018   Procedure: HOT HEMOSTASIS (ARGON PLASMA COAGULATION/BICAP);  Surgeon: Irving Copas., MD;  Location: [place];  Service: Gastroenterology;  Laterality: N/A;  . REVISON OF ARTERIOVENOUS FISTULA Left 02/09/2014   Procedure: REVISON OF LEFT ARTERIOVENOUS FISTULA - RESECTION OF RENDUNDANT VEIN;  Surgeon: Angelia Mould, MD;  Location: [place];  Service: Vascular;  Laterality: Left;  . SBO with lysis adhesions    . SHUNTOGRAM Left 04/19/2014   Procedure: FISTULOGRAM;  Surgeon: Angelia Mould, MD;  Location: [facility] CATH LAB;  Service: Cardiovascular;  Laterality: Left;  . UNILATERAL UPPER EXTREMEITY ANGIOGRAM N/A 07/12/2014   Procedure: UNILATERAL UPPER Anselmo Rod;  Surgeon: Angelia Mould, MD;  Location: [facility] CATH LAB;  Service: Cardiovascular;  Laterality: N/A;    Family History  Problem Relation Age of Onset  . Aneurysm Mother   . Heart disease Father   . Stroke Father   . Hypertension Father   . Diabetes Father   . Dementia Neg Hx   . Colon cancer Neg Hx   . Esophageal cancer Neg Hx   . Pancreatic cancer Neg Hx   . Prostate cancer Neg Hx   . Rectal cancer Neg Hx   . Stomach cancer Neg Hx     Social History:  reports that he quit smoking about 24 years ago. He has never used smokeless tobacco. He reports that he does not drink alcohol or use drugs.    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 he is not a candidate  for Forteo and nephrologist suggested Prolia He has had his first injection of Prolia 5/19 and repeat in 12/19 No recent back pain  His hospital discharge indicates he had rib fractures after his fall in October  Calcium corrected for his albumin is normal Is taking any Sensipar currently  Lab Results  Component Value Date   PTH 154 (H) 10/06/2014   CALCIUM 7.2 (L) 11/21/2018   CAION 1.33 07/21/2018   PHOS 2.6 11/21/2018     Lipid history: He has been treated with Simvastatin 20 mg  by PCP, results as follows    Lab Results  Component Value Date   CHOL 79 09/05/2017   HDL 38.00 (L) 09/05/2017   LDLCALC 21 09/05/2017   TRIG 99.0 09/05/2017   CHOLHDL 2 09/05/2017          He has ESRD on dialysis    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  Still on 50 mcg levothyroxine with normal TSH levels  Lab Results  Component Value Date   TSH 1.77 11/13/2018       Physical Examination:  BP 110/60 (BP Location: Left Arm, Patient Position: Sitting, Cuff Size: Normal)   Pulse 75   Ht _0  (1.88 m)   Wt 150 lb (68 kg)   SpO2 96%   BMI 19.26 kg/m       ASSESSMENT:  Diabetes type 2  See history of present illness for discussion of his current management, blood sugar patterns and problems identified  His fructosamine is improving and now 292  Since he was not responding to Tradjenta and Prandin indicating insulin deficiency he is doing much better with only low doses of NovoLog before meals His wife is helping him with his injections Reviewed his blood sugar patterns, diet and timing of injections He and his wife did not understand the need to cover his meals to prevent postprandial hyperglycemia regardless of pre-meal blood sugar This was discussed in detail today Blood sugar targets before and after meals were also discussed and currently not checking after meals Hypoglycemia discussed  OSTEOPOROSIS  with fracture: He is due for Prolia in June and he will be seen at that time also  PLAN:   No change in basic insulin dose Discussed needing to take his pre-meal insulin regardless of the blood sugar level He will only adjust his insulin dose if he is eating a relatively small and low carbohydrate meal or extra carbohydrates or desserts He will continue to take his NovoLog with a sandwich when he comes back from dialysis Will call if he has any inconsistent blood sugars May not need to check his blood sugar consistently in the morning since these are fairly stable and near normal  There are no Patient Instructions on file for this visit.   Counseling time on subjects discussed in assessment and plan sections is over 50% of today's 25 minute visit      Elayne Snare 01/15/2019, 2:03 PM   Note: This office note was prepared with Dragon voice recognition system technology. Any transcriptional errors that result from this process are unintentional.

## 2019-01-15 NOTE — Patient Instructions (Signed)
Check blood sugars on waking up 2-3 days a week  Also check blood sugars about 2 hours after meals and do this after different meals by rotation  Recommended blood sugar levels on waking up are 90-130 and about 2 hours after meal is 130-180  Please bring your blood sugar monitor to each visit, thank you   

## 2019-01-16 ENCOUNTER — Telehealth: Payer: Self-pay | Admitting: Endocrinology

## 2019-01-16 DIAGNOSIS — E1129 Type 2 diabetes mellitus with other diabetic kidney complication: Secondary | ICD-10-CM | POA: Diagnosis not present

## 2019-01-16 DIAGNOSIS — D631 Anemia in chronic kidney disease: Secondary | ICD-10-CM | POA: Diagnosis not present

## 2019-01-16 DIAGNOSIS — N2581 Secondary hyperparathyroidism of renal origin: Secondary | ICD-10-CM | POA: Diagnosis not present

## 2019-01-16 DIAGNOSIS — N186 End stage renal disease: Secondary | ICD-10-CM | POA: Diagnosis not present

## 2019-01-16 DIAGNOSIS — D509 Iron deficiency anemia, unspecified: Secondary | ICD-10-CM | POA: Diagnosis not present

## 2019-01-16 NOTE — Telephone Encounter (Signed)
Please advise 

## 2019-01-16 NOTE — Telephone Encounter (Signed)
Per [name], patient is on dialysis for 5.5 hours 2 days a week. Normally he is allowed snacks, but now the dialysis facility is not allowing any food because the patients would not constantly be in masks.  He is having some low blood sugars and she was advised by the dialysis facility to adjust insulin.  She needs to know how to adjust insulin accordingly

## 2019-01-16 NOTE — Telephone Encounter (Signed)
On the morning of dialysis he can reduce his insulin to 3 units instead of 4 units.  Also need to know if he is taking his Tradjenta tablets

## 2019-01-19 DIAGNOSIS — D631 Anemia in chronic kidney disease: Secondary | ICD-10-CM | POA: Diagnosis not present

## 2019-01-19 DIAGNOSIS — N2581 Secondary hyperparathyroidism of renal origin: Secondary | ICD-10-CM | POA: Diagnosis not present

## 2019-01-19 DIAGNOSIS — N186 End stage renal disease: Secondary | ICD-10-CM | POA: Diagnosis not present

## 2019-01-19 DIAGNOSIS — E1129 Type 2 diabetes mellitus with other diabetic kidney complication: Secondary | ICD-10-CM | POA: Diagnosis not present

## 2019-01-19 DIAGNOSIS — D509 Iron deficiency anemia, unspecified: Secondary | ICD-10-CM | POA: Diagnosis not present

## 2019-01-19 NOTE — Telephone Encounter (Signed)
Wife called and pt is not taking trajenta at this time and when he started the novolog they thought the trajenta was to be stopped.

## 2019-01-19 NOTE — Telephone Encounter (Signed)
Called and left voicemail with MD message on wife cell phone.

## 2019-01-19 NOTE — Telephone Encounter (Signed)
Would like him to go back to Tradjenta as this will keep his blood sugars more even

## 2019-01-20 ENCOUNTER — Other Ambulatory Visit: Payer: Self-pay

## 2019-01-20 ENCOUNTER — Encounter: Payer: Self-pay | Admitting: Internal Medicine

## 2019-01-20 ENCOUNTER — Ambulatory Visit (INDEPENDENT_AMBULATORY_CARE_PROVIDER_SITE_OTHER): Payer: Medicare Other | Admitting: Internal Medicine

## 2019-01-20 VITALS — BP 124/68 | Ht 74.0 in | Wt 156.0 lb

## 2019-01-20 DIAGNOSIS — I10 Essential (primary) hypertension: Secondary | ICD-10-CM | POA: Diagnosis not present

## 2019-01-20 DIAGNOSIS — Z992 Dependence on renal dialysis: Secondary | ICD-10-CM

## 2019-01-20 DIAGNOSIS — R413 Other amnesia: Secondary | ICD-10-CM | POA: Diagnosis not present

## 2019-01-20 DIAGNOSIS — Z0000 Encounter for general adult medical examination without abnormal findings: Secondary | ICD-10-CM

## 2019-01-20 DIAGNOSIS — N186 End stage renal disease: Secondary | ICD-10-CM | POA: Diagnosis not present

## 2019-01-20 DIAGNOSIS — E538 Deficiency of other specified B group vitamins: Secondary | ICD-10-CM

## 2019-01-20 NOTE — Progress Notes (Signed)
   Subjective:    Patient ID: [name], male    DOB: Mar 02, 1941, 78 y.o.   MRN: [id number]  HPI 78 year old Black Male seen today for subsequent annual Medicare wellness exam.  He is seen by interactive audio and video telecommunications between this provider and himself due to the Coronavirus pandemic.  His wife is with him during this visit today to help answer questions.  He consents to visit in this format today.  He is identified as [name]. [name], a patient in this practice using 2 identifiers.  He has a history of end-stage renal disease admitted to dialysis 3 times weekly.  He has a history of mild memory loss which according to his wife is stable at the present time.  He developed anemia with GI bleed and recent CBC March 5 hemoglobin to be 11.1 g.  On February 26 hemoglobin was 7.6 g.  He has a history of B12 deficiency and is supposed to be getting B12 injections monthly.  History of diabetes mellitus followed by Dr. Dwyane Dee, Endocrinologist. Hemoglobin A1c March 23 was 5.3%.  Since hospital discharge in late February at which time he was hospitalized for a GI bleed, he has done very well.  His wife says that he will drive small distances to pick up the yard person but does not drive on major highways.  He denies being depressed having crying episodes or feeling hopeless.  He does walk some with a cane or walker from his house to his mailbox.  He continues to have issues with rash and itching and is seen at the [facility] in [place] for topical treatment.  He apparently had 1 fall in the past year with no injuries.  He is able to dress himself.  A CNA comes several hours daily to help him with bathing and dressing.  She also helps with laundry.  She is [place] 5 days a week about 3 hours a day.  They were able to get some assistance from the [place] to pay for her.  They like her very much.  Sometimes he has to ask people to repeat themselves due to poor hearing.  Does not feel  that he needs hearing aids.  He does have short-term memory loss.  Sometimes misplaces items.  Does not complain of ringing in his ears.  He has a smoke alarm in his residence.  Has grab bars in the bathroom.  Does have some throw rugs about the house.  Recent weight was noted to be 156 pounds.  Appetite good.    Review of Systems no new complaints  No recent falls     Objective:   Physical Exam Not examined today-just and for annual Medicare wellness visit.  Physical exam will be scheduled for early July.  Fasting lab studies at that time as well.       Assessment & Plan:  Annual Medicare wellness exam  End-stage renal disease on dialysis  Diabetes mellitus  Memory loss  Recent GI bleed with anemia-improved  Plan: Patient will return July 2020  for in person physical exam, evaluation of medical issues, and fasting lab studies  A total time of 30 minutes spent reviewing medical records preparing for visit, interviewing patient, making medical decisions regarding his care.

## 2019-01-21 DIAGNOSIS — N2581 Secondary hyperparathyroidism of renal origin: Secondary | ICD-10-CM | POA: Diagnosis not present

## 2019-01-21 DIAGNOSIS — N186 End stage renal disease: Secondary | ICD-10-CM | POA: Diagnosis not present

## 2019-01-21 DIAGNOSIS — D509 Iron deficiency anemia, unspecified: Secondary | ICD-10-CM | POA: Diagnosis not present

## 2019-01-21 DIAGNOSIS — D631 Anemia in chronic kidney disease: Secondary | ICD-10-CM | POA: Diagnosis not present

## 2019-01-21 DIAGNOSIS — E1129 Type 2 diabetes mellitus with other diabetic kidney complication: Secondary | ICD-10-CM | POA: Diagnosis not present

## 2019-01-21 NOTE — Telephone Encounter (Signed)
Please clarify. Does the pt need to be taking insulin and Tradjenta?

## 2019-01-21 NOTE — Telephone Encounter (Signed)
Called pt's wife and gave her MD message that patient should be taking Tradjenta as well as insulin. [name] verbalized understanding.

## 2019-01-21 NOTE — Telephone Encounter (Signed)
He will stay with the same insulin dose and add Tradjenta

## 2019-01-22 NOTE — Patient Instructions (Signed)
It was a pleasure to see you today virtual visit.  We will plan to follow-up when in person physical exam and fasting labs July 2020.  Continue same medications.

## 2019-01-23 DIAGNOSIS — N186 End stage renal disease: Secondary | ICD-10-CM | POA: Diagnosis not present

## 2019-01-23 DIAGNOSIS — E1129 Type 2 diabetes mellitus with other diabetic kidney complication: Secondary | ICD-10-CM | POA: Diagnosis not present

## 2019-01-23 DIAGNOSIS — N2581 Secondary hyperparathyroidism of renal origin: Secondary | ICD-10-CM | POA: Diagnosis not present

## 2019-01-23 DIAGNOSIS — D631 Anemia in chronic kidney disease: Secondary | ICD-10-CM | POA: Diagnosis not present

## 2019-01-23 DIAGNOSIS — Z992 Dependence on renal dialysis: Secondary | ICD-10-CM | POA: Diagnosis not present

## 2019-01-23 DIAGNOSIS — E119 Type 2 diabetes mellitus without complications: Secondary | ICD-10-CM | POA: Diagnosis not present

## 2019-01-23 DIAGNOSIS — E1122 Type 2 diabetes mellitus with diabetic chronic kidney disease: Secondary | ICD-10-CM | POA: Diagnosis not present

## 2019-01-26 DIAGNOSIS — E1129 Type 2 diabetes mellitus with other diabetic kidney complication: Secondary | ICD-10-CM | POA: Diagnosis not present

## 2019-01-26 DIAGNOSIS — E119 Type 2 diabetes mellitus without complications: Secondary | ICD-10-CM | POA: Diagnosis not present

## 2019-01-26 DIAGNOSIS — N186 End stage renal disease: Secondary | ICD-10-CM | POA: Diagnosis not present

## 2019-01-26 DIAGNOSIS — N2581 Secondary hyperparathyroidism of renal origin: Secondary | ICD-10-CM | POA: Diagnosis not present

## 2019-01-26 DIAGNOSIS — D631 Anemia in chronic kidney disease: Secondary | ICD-10-CM | POA: Diagnosis not present

## 2019-01-28 DIAGNOSIS — E119 Type 2 diabetes mellitus without complications: Secondary | ICD-10-CM | POA: Diagnosis not present

## 2019-01-28 DIAGNOSIS — N186 End stage renal disease: Secondary | ICD-10-CM | POA: Diagnosis not present

## 2019-01-28 DIAGNOSIS — N2581 Secondary hyperparathyroidism of renal origin: Secondary | ICD-10-CM | POA: Diagnosis not present

## 2019-01-28 DIAGNOSIS — D631 Anemia in chronic kidney disease: Secondary | ICD-10-CM | POA: Diagnosis not present

## 2019-01-28 DIAGNOSIS — E1129 Type 2 diabetes mellitus with other diabetic kidney complication: Secondary | ICD-10-CM | POA: Diagnosis not present

## 2019-01-30 DIAGNOSIS — E1129 Type 2 diabetes mellitus with other diabetic kidney complication: Secondary | ICD-10-CM | POA: Diagnosis not present

## 2019-01-30 DIAGNOSIS — N2581 Secondary hyperparathyroidism of renal origin: Secondary | ICD-10-CM | POA: Diagnosis not present

## 2019-01-30 DIAGNOSIS — D631 Anemia in chronic kidney disease: Secondary | ICD-10-CM | POA: Diagnosis not present

## 2019-01-30 DIAGNOSIS — E119 Type 2 diabetes mellitus without complications: Secondary | ICD-10-CM | POA: Diagnosis not present

## 2019-01-30 DIAGNOSIS — N186 End stage renal disease: Secondary | ICD-10-CM | POA: Diagnosis not present

## 2019-01-31 DIAGNOSIS — D631 Anemia in chronic kidney disease: Secondary | ICD-10-CM | POA: Diagnosis not present

## 2019-01-31 DIAGNOSIS — N186 End stage renal disease: Secondary | ICD-10-CM | POA: Diagnosis not present

## 2019-01-31 DIAGNOSIS — E1129 Type 2 diabetes mellitus with other diabetic kidney complication: Secondary | ICD-10-CM | POA: Diagnosis not present

## 2019-01-31 DIAGNOSIS — N2581 Secondary hyperparathyroidism of renal origin: Secondary | ICD-10-CM | POA: Diagnosis not present

## 2019-01-31 DIAGNOSIS — E119 Type 2 diabetes mellitus without complications: Secondary | ICD-10-CM | POA: Diagnosis not present

## 2019-02-02 DIAGNOSIS — E119 Type 2 diabetes mellitus without complications: Secondary | ICD-10-CM | POA: Diagnosis not present

## 2019-02-02 DIAGNOSIS — E1129 Type 2 diabetes mellitus with other diabetic kidney complication: Secondary | ICD-10-CM | POA: Diagnosis not present

## 2019-02-02 DIAGNOSIS — D631 Anemia in chronic kidney disease: Secondary | ICD-10-CM | POA: Diagnosis not present

## 2019-02-02 DIAGNOSIS — N186 End stage renal disease: Secondary | ICD-10-CM | POA: Diagnosis not present

## 2019-02-02 DIAGNOSIS — N2581 Secondary hyperparathyroidism of renal origin: Secondary | ICD-10-CM | POA: Diagnosis not present

## 2019-02-04 DIAGNOSIS — E1129 Type 2 diabetes mellitus with other diabetic kidney complication: Secondary | ICD-10-CM | POA: Diagnosis not present

## 2019-02-04 DIAGNOSIS — N2581 Secondary hyperparathyroidism of renal origin: Secondary | ICD-10-CM | POA: Diagnosis not present

## 2019-02-04 DIAGNOSIS — D631 Anemia in chronic kidney disease: Secondary | ICD-10-CM | POA: Diagnosis not present

## 2019-02-04 DIAGNOSIS — E119 Type 2 diabetes mellitus without complications: Secondary | ICD-10-CM | POA: Diagnosis not present

## 2019-02-04 DIAGNOSIS — N186 End stage renal disease: Secondary | ICD-10-CM | POA: Diagnosis not present

## 2019-02-06 DIAGNOSIS — N186 End stage renal disease: Secondary | ICD-10-CM | POA: Diagnosis not present

## 2019-02-06 DIAGNOSIS — E119 Type 2 diabetes mellitus without complications: Secondary | ICD-10-CM | POA: Diagnosis not present

## 2019-02-06 DIAGNOSIS — D631 Anemia in chronic kidney disease: Secondary | ICD-10-CM | POA: Diagnosis not present

## 2019-02-06 DIAGNOSIS — E1129 Type 2 diabetes mellitus with other diabetic kidney complication: Secondary | ICD-10-CM | POA: Diagnosis not present

## 2019-02-06 DIAGNOSIS — N2581 Secondary hyperparathyroidism of renal origin: Secondary | ICD-10-CM | POA: Diagnosis not present

## 2019-02-09 DIAGNOSIS — E1129 Type 2 diabetes mellitus with other diabetic kidney complication: Secondary | ICD-10-CM | POA: Diagnosis not present

## 2019-02-09 DIAGNOSIS — N2581 Secondary hyperparathyroidism of renal origin: Secondary | ICD-10-CM | POA: Diagnosis not present

## 2019-02-09 DIAGNOSIS — E119 Type 2 diabetes mellitus without complications: Secondary | ICD-10-CM | POA: Diagnosis not present

## 2019-02-09 DIAGNOSIS — D631 Anemia in chronic kidney disease: Secondary | ICD-10-CM | POA: Diagnosis not present

## 2019-02-09 DIAGNOSIS — N186 End stage renal disease: Secondary | ICD-10-CM | POA: Diagnosis not present

## 2019-02-11 DIAGNOSIS — D631 Anemia in chronic kidney disease: Secondary | ICD-10-CM | POA: Diagnosis not present

## 2019-02-11 DIAGNOSIS — N186 End stage renal disease: Secondary | ICD-10-CM | POA: Diagnosis not present

## 2019-02-11 DIAGNOSIS — N2581 Secondary hyperparathyroidism of renal origin: Secondary | ICD-10-CM | POA: Diagnosis not present

## 2019-02-11 DIAGNOSIS — E119 Type 2 diabetes mellitus without complications: Secondary | ICD-10-CM | POA: Diagnosis not present

## 2019-02-11 DIAGNOSIS — E1129 Type 2 diabetes mellitus with other diabetic kidney complication: Secondary | ICD-10-CM | POA: Diagnosis not present

## 2019-02-13 DIAGNOSIS — N186 End stage renal disease: Secondary | ICD-10-CM | POA: Diagnosis not present

## 2019-02-13 DIAGNOSIS — E119 Type 2 diabetes mellitus without complications: Secondary | ICD-10-CM | POA: Diagnosis not present

## 2019-02-13 DIAGNOSIS — D631 Anemia in chronic kidney disease: Secondary | ICD-10-CM | POA: Diagnosis not present

## 2019-02-13 DIAGNOSIS — E1129 Type 2 diabetes mellitus with other diabetic kidney complication: Secondary | ICD-10-CM | POA: Diagnosis not present

## 2019-02-13 DIAGNOSIS — N2581 Secondary hyperparathyroidism of renal origin: Secondary | ICD-10-CM | POA: Diagnosis not present

## 2019-02-16 DIAGNOSIS — E1129 Type 2 diabetes mellitus with other diabetic kidney complication: Secondary | ICD-10-CM | POA: Diagnosis not present

## 2019-02-16 DIAGNOSIS — N186 End stage renal disease: Secondary | ICD-10-CM | POA: Diagnosis not present

## 2019-02-16 DIAGNOSIS — E119 Type 2 diabetes mellitus without complications: Secondary | ICD-10-CM | POA: Diagnosis not present

## 2019-02-16 DIAGNOSIS — D631 Anemia in chronic kidney disease: Secondary | ICD-10-CM | POA: Diagnosis not present

## 2019-02-16 DIAGNOSIS — N2581 Secondary hyperparathyroidism of renal origin: Secondary | ICD-10-CM | POA: Diagnosis not present

## 2019-02-17 ENCOUNTER — Encounter: Payer: Self-pay | Admitting: Gastroenterology

## 2019-02-18 DIAGNOSIS — E1129 Type 2 diabetes mellitus with other diabetic kidney complication: Secondary | ICD-10-CM | POA: Diagnosis not present

## 2019-02-18 DIAGNOSIS — E119 Type 2 diabetes mellitus without complications: Secondary | ICD-10-CM | POA: Diagnosis not present

## 2019-02-18 DIAGNOSIS — D631 Anemia in chronic kidney disease: Secondary | ICD-10-CM | POA: Diagnosis not present

## 2019-02-18 DIAGNOSIS — N186 End stage renal disease: Secondary | ICD-10-CM | POA: Diagnosis not present

## 2019-02-18 DIAGNOSIS — N2581 Secondary hyperparathyroidism of renal origin: Secondary | ICD-10-CM | POA: Diagnosis not present

## 2019-02-20 DIAGNOSIS — N186 End stage renal disease: Secondary | ICD-10-CM | POA: Diagnosis not present

## 2019-02-20 DIAGNOSIS — N2581 Secondary hyperparathyroidism of renal origin: Secondary | ICD-10-CM | POA: Diagnosis not present

## 2019-02-20 DIAGNOSIS — E1129 Type 2 diabetes mellitus with other diabetic kidney complication: Secondary | ICD-10-CM | POA: Diagnosis not present

## 2019-02-20 DIAGNOSIS — D631 Anemia in chronic kidney disease: Secondary | ICD-10-CM | POA: Diagnosis not present

## 2019-02-20 DIAGNOSIS — E119 Type 2 diabetes mellitus without complications: Secondary | ICD-10-CM | POA: Diagnosis not present

## 2019-02-23 DIAGNOSIS — D631 Anemia in chronic kidney disease: Secondary | ICD-10-CM | POA: Diagnosis not present

## 2019-02-23 DIAGNOSIS — E119 Type 2 diabetes mellitus without complications: Secondary | ICD-10-CM | POA: Diagnosis not present

## 2019-02-23 DIAGNOSIS — E1129 Type 2 diabetes mellitus with other diabetic kidney complication: Secondary | ICD-10-CM | POA: Diagnosis not present

## 2019-02-23 DIAGNOSIS — N2581 Secondary hyperparathyroidism of renal origin: Secondary | ICD-10-CM | POA: Diagnosis not present

## 2019-02-23 DIAGNOSIS — N186 End stage renal disease: Secondary | ICD-10-CM | POA: Diagnosis not present

## 2019-02-23 DIAGNOSIS — Z992 Dependence on renal dialysis: Secondary | ICD-10-CM | POA: Diagnosis not present

## 2019-02-23 DIAGNOSIS — E1122 Type 2 diabetes mellitus with diabetic chronic kidney disease: Secondary | ICD-10-CM | POA: Diagnosis not present

## 2019-02-23 DIAGNOSIS — D509 Iron deficiency anemia, unspecified: Secondary | ICD-10-CM | POA: Diagnosis not present

## 2019-02-25 DIAGNOSIS — E1129 Type 2 diabetes mellitus with other diabetic kidney complication: Secondary | ICD-10-CM | POA: Diagnosis not present

## 2019-02-25 DIAGNOSIS — N2581 Secondary hyperparathyroidism of renal origin: Secondary | ICD-10-CM | POA: Diagnosis not present

## 2019-02-25 DIAGNOSIS — D631 Anemia in chronic kidney disease: Secondary | ICD-10-CM | POA: Diagnosis not present

## 2019-02-25 DIAGNOSIS — E119 Type 2 diabetes mellitus without complications: Secondary | ICD-10-CM | POA: Diagnosis not present

## 2019-02-25 DIAGNOSIS — N186 End stage renal disease: Secondary | ICD-10-CM | POA: Diagnosis not present

## 2019-02-25 DIAGNOSIS — D509 Iron deficiency anemia, unspecified: Secondary | ICD-10-CM | POA: Diagnosis not present

## 2019-02-27 DIAGNOSIS — D631 Anemia in chronic kidney disease: Secondary | ICD-10-CM | POA: Diagnosis not present

## 2019-02-27 DIAGNOSIS — N2581 Secondary hyperparathyroidism of renal origin: Secondary | ICD-10-CM | POA: Diagnosis not present

## 2019-02-27 DIAGNOSIS — E1129 Type 2 diabetes mellitus with other diabetic kidney complication: Secondary | ICD-10-CM | POA: Diagnosis not present

## 2019-02-27 DIAGNOSIS — D509 Iron deficiency anemia, unspecified: Secondary | ICD-10-CM | POA: Diagnosis not present

## 2019-02-27 DIAGNOSIS — E119 Type 2 diabetes mellitus without complications: Secondary | ICD-10-CM | POA: Diagnosis not present

## 2019-02-27 DIAGNOSIS — N186 End stage renal disease: Secondary | ICD-10-CM | POA: Diagnosis not present

## 2019-03-02 DIAGNOSIS — E119 Type 2 diabetes mellitus without complications: Secondary | ICD-10-CM | POA: Diagnosis not present

## 2019-03-02 DIAGNOSIS — D509 Iron deficiency anemia, unspecified: Secondary | ICD-10-CM | POA: Diagnosis not present

## 2019-03-02 DIAGNOSIS — D631 Anemia in chronic kidney disease: Secondary | ICD-10-CM | POA: Diagnosis not present

## 2019-03-02 DIAGNOSIS — N2581 Secondary hyperparathyroidism of renal origin: Secondary | ICD-10-CM | POA: Diagnosis not present

## 2019-03-02 DIAGNOSIS — N186 End stage renal disease: Secondary | ICD-10-CM | POA: Diagnosis not present

## 2019-03-02 DIAGNOSIS — E1129 Type 2 diabetes mellitus with other diabetic kidney complication: Secondary | ICD-10-CM | POA: Diagnosis not present

## 2019-03-03 ENCOUNTER — Telehealth (INDEPENDENT_AMBULATORY_CARE_PROVIDER_SITE_OTHER): Payer: Medicare Other | Admitting: Gastroenterology

## 2019-03-03 ENCOUNTER — Other Ambulatory Visit: Payer: Self-pay

## 2019-03-03 ENCOUNTER — Encounter: Payer: Self-pay | Admitting: Gastroenterology

## 2019-03-03 DIAGNOSIS — D500 Iron deficiency anemia secondary to blood loss (chronic): Secondary | ICD-10-CM | POA: Diagnosis not present

## 2019-03-03 DIAGNOSIS — S065XAA Traumatic subdural hemorrhage with loss of consciousness status unknown, initial encounter: Secondary | ICD-10-CM

## 2019-03-03 DIAGNOSIS — K5520 Angiodysplasia of colon without hemorrhage: Secondary | ICD-10-CM | POA: Diagnosis not present

## 2019-03-03 DIAGNOSIS — K219 Gastro-esophageal reflux disease without esophagitis: Secondary | ICD-10-CM | POA: Diagnosis not present

## 2019-03-03 DIAGNOSIS — K5909 Other constipation: Secondary | ICD-10-CM

## 2019-03-03 DIAGNOSIS — S065X9A Traumatic subdural hemorrhage with loss of consciousness of unspecified duration, initial encounter: Secondary | ICD-10-CM

## 2019-03-03 DIAGNOSIS — Z992 Dependence on renal dialysis: Secondary | ICD-10-CM

## 2019-03-03 DIAGNOSIS — Z86010 Personal history of colonic polyps: Secondary | ICD-10-CM

## 2019-03-03 DIAGNOSIS — K31819 Angiodysplasia of stomach and duodenum without bleeding: Secondary | ICD-10-CM

## 2019-03-03 DIAGNOSIS — N186 End stage renal disease: Secondary | ICD-10-CM

## 2019-03-03 NOTE — Patient Instructions (Addendum)
If you are age 78 or older, your body mass index should be between 23-30. Your There is no height or weight on file to calculate BMI. If this is out of the aforementioned range listed, please consider follow up with your Primary Care Provider.  If you are age 41 or younger, your body mass index should be between 19-25. Your There is no height or weight on file to calculate BMI. If this is out of the aformentioned range listed, please consider follow up with your Primary Care Provider.   To help prevent the possible spread of infection to our patients, communities, and staff; we will be implementing the following measures:  As of now we are not allowing any visitors/family members to accompany you to any upcoming appointments with [facility]. If you have any concerns about this please contact our office to discuss prior to the appointment.     [facility] CAPSULE ENDOSCOPY PATIENT INSTRUCTION [name] [birth year] [id number]   1. 03/10/2019 Seven (7) days prior to capsule endoscopy stop taking iron supplements and carafate.  2. 03/15/2019 Two (2) days prior to capsule endoscopy stop taking aspirin or any arthritis drugs.  3. 03/16/2019 Day before capsule endoscopy purchase a 238 gram bottle of Miralax from the laxative section of your drug store, and a 32 oz. bottle of Gatorade (no red).    4. 03/16/2019 One (1) day prior to capsule endoscopy: a) Stop smoking. b) Eat a regular diet until 12:00 Noon. c) After 12:00 Noon take only the following: Black coffee  Jell-O (no fruit or red Jell-o) Water   [name] (chicken or beef) 7-Up   Cranberry Juice Tea   Kool-Aid Popsicle (not red) Sprite   Coke Ginger Ale  Pepsi Mountain Dew Gatorade d) At 6:00 pm the evening before your appointment, drink 7 capfuls (105 grams) of Miralax with 32 oz. Gatorade. Drink 8 oz every 15 minutes until gone. e) Nothing to eat or drink after midnight except medications with a sip of  water.  5. 03/17/2019 Day of capsule endoscopy:            Do not have anything to eat or drink after midnight No medications for 2 hours prior to your test.  6. Please arrive at [facility]  3rd floor patient registration area by 8:00amam on: 03/17/2019.   For any questions: Call [place] at [phone] and ask to speak with one of the capsule endoscopy nurses.     What you should know.   You have been scheduled for a Capsule Endoscopy with Capsocam.  You will swallow a vitamin size pill that contains cameras that will take pictures of your small intestine (bowel).  Your small bowel connects to your stomach on one end and the large intestine or bowel on the other. The capsule moves through the gastrointestinal tract taking pictures along the way. The images are stored on the capsule.  1-5 days after the capsule ingestion you will retrieve the capsule and mail it in a prepaid envelope to [employer] to download the images.  Your physician will be provided a copy of the images to review.   Who should have a capsule endoscopy?  You may need a small bowel capsule endoscopy if you have symptoms, such as blood in your stool, chronic pain, diarrhea, or unexplained anemia. The small intestine is a hollow organ that cannot be easily visualized with a scope due to its length.  The capsule endoscopy allows your physician to directly  visualize the intestine.  The pictures may show intestine growths, inflammation, or bleeding in the small intestine.   What are the risks with a capsule endoscopy?  The pictures may not be clear or give [place] a clear cause of your symptoms The capsule may become trapped in the esophagus or intestines.  You may need surgery or endoscopic procedures to remove the capsule. You may not have a MRI until the capsule endoscopy has been retrieved.   How do I retrieve the capsule?  You will be provided a kit with step-by-step instructions for capsule retrieval and  mailing.  You should expect to retrieve the capsule in 1-5 days after ingestion.  This will depend on your individual bowel habits.   It was a pleasure to see you today!  Vito Cirigliano, D.O.

## 2019-03-03 NOTE — Progress Notes (Signed)
Chief Complaint: Small bowel AVMs, chronic anemia  Referring Provider:     Elby Showers, MD  GI History: 78 y.o. male with a history of ESRDon hemodialysis, CAD, HTN, AAA repair 1992, DM II, Hypothyroidism, GERD, subdural hematoma  S/p fall requiring subdural evacuation in 06/2017, diverticulitis, HX of UGI bleed9/2019 (gastric AVMs) and readmitted in 10/2018 with melena, requiring 1 unit PRBCs (Hgb nadir 7.6).  CT unrevealing. Small bowel enteroscopy completed 11/20/2018 and n/f bleeding in gastric cardia (APC), AVM in gastric fundus (APC), gastritis, oozing x2 in the duodenal bulb (APC), 3 AVMs in proximal jejunum (APC).  Discharge hemoglobin 8.6 and repeat in 11/2018 was 11.1 and 10.6 in late March.  IV iron infusion with HD in 12/2018.   HPI:    Due to current restrictions/limitations of in-office visits due to the COVID-19 pandemic, this scheduled clinical appointment was converted to a telehealth virtual consultation using D[employer].  -Time of medical discussion: 25 minutes -The patient did consent to this virtual visit and is aware of possible charges through their insurance for this visit.  -Names of all parties present: [name] (patient), [name] (patient's wife), Gerrit Heck, DO, [facility] (physician) -Patient location: Home -Physician location: Office  [name] is a 78 y.o. male presenting to the Gastroenterology Clinic for routine follow-up.  He was last seen by me in 12/30/2018 for hospital follow-up from admission in 10/2018.  At that time was generally without any complaints.  Had planned for serial H/H checks with HD with IV iron infusions per protocol, along with plan for VCE to evaluate for additional small bowel AVMs.  Held off on repeat colonoscopy, as this was done in 2019 and without colonic AVMs.  Appropriately decided against medical management for AVMs given ADR profile.  Today he states he had a single episode of dark stools x1 last week,  then resolved by the following day. No abdominal pain, n/v, SOB, CP,DOE.  No preceding change in medications, oral iron, etc.  Otherwise in his usual state of health. Last IV iron infusion was yesterday.  Separately, endorses longstanding history of constipation.  Generally well controlled with MiraLAX daily, but with loose stools on last appointment, so decreased MiraLAX dosing to 3/4 cup/day.  Now recent increase in straining to have BM.  No hematochezia or abdominal pain.  Has restarted MiraLAX daily.  Followed up with Neurosurgery and no plan for repeat intervention at this time in the absence of neurologic sxs. He denies any headaches, falls, syncope. Has restarted ASA 81 mg/day approx 3 weeks ago.   Endoscopic History: -11/20/2018 Small Bowel Enteroscopy: gastric cardia (APC), AVM in gastric fundus (APC), gastritis, oozing x2 in the duodenal bulb (APC), 3 AVMs in proximal jejunum (APC).  Tattoo placed at distal extent in jejunum. -06/03/2018 Small Bowel Enteroscopy:no evidence of significant pathology in the duodenal bulb, in the first portion of the duodenum, in the second portion of the duodenum, in the third portion of the duodenum and in the fourth portion of the duodenum. The examined esophagus was normal. A single 3 mm stigmata of recent bleeding angioectasia was found in the gastric fundus. Coagulation for hemostasis using argon plasma at 1 liter/minute and 20 watts was successful. -EGD 11/12/2017:Mild distal esophageal stricture which was dilated otherwise negative exam. No evidence of H. Pylori. -Colonoscopy 11/12/2017:A 9 mmTApolyp was found in the transverse colon.A1 mmhyperplasticpolyp was found in the recto-sigmoid colon. A single localized  non-bleeding erosion was found in the transverse colon. No stigmata of recent bleeding wasseen. A few small and large-mouthed diverticula were found in the sigmoid colon, descending colon, transverse colon, hepatic flexure and ascending colon.  Non-bleeding internal hemorrhoids were found during retroflexion. The hemorrhoids were small. -Colonoscopy 10/28/2008: Right-sided and sigmoid diverticulosis. -Colonoscopy 06/08/2003: Diverticulosis  Past medical history, past surgical history, social history, family history, medications, and allergies reviewed in the chart and with patient.    Past Medical History:  Diagnosis Date   Allergy    Anemia    Arthritis    Cataract    bil cateracts removed   Coronary artery disease    Dementia arising in the senium and presenium ([place])    Diabetes mellitus    Type 2   Diverticulitis    ED (erectile dysfunction)    Elevated homocysteine (HCC)    ESRD (end stage renal disease) on dialysis ([place]) 03/2015   GERD (gastroesophageal reflux disease)    pepto    Gout    Hyperlipidemia    Hypertension    Hypothyroidism    Pneumonia    PVD (peripheral vascular disease) ([place])    has plastic aorta   Seasonal allergies    Sleep apnea    does not wear c-pap     Past Surgical History:  Procedure Laterality Date   aortobifemoral bypass     AV FISTULA PLACEMENT Left 12/01/2013   Procedure: ARTERIOVENOUS (AV) FISTULA CREATION- LEFT BRACHIOCEPHALIC;  Surgeon: Angelia Mould, MD;  Location: [place];  Service: Vascular;  Laterality: Left;   [place] Right 07/27/2014   Procedure: BASCILIC VEIN TRANSPOSITION;  Surgeon: Angelia Mould, MD;  Location: [place];  Service: Vascular;  Laterality: Right;   BRAIN SURGERY  07/22/2018   BREAST SURGERY     left - granulomatous mastitis   BURR HOLE Bilateral 07/22/2018   Procedure: BILATERAL BURR HOLES;  Surgeon: Kristeen Miss, MD;  Location: [place];  Service: Neurosurgery;  Laterality: Bilateral;   COLONOSCOPY     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 ([place] [facility]X)  1992   ENTEROSCOPY N/A 06/03/2018   Procedure: ENTEROSCOPY;  Surgeon: Lavena Bullion, DO;  Location: [facility];  Service: Gastroenterology;   Laterality: N/A;   ENTEROSCOPY N/A 11/20/2018   Procedure: ENTEROSCOPY;  Surgeon: Rush Landmark Telford Nab., MD;  Location: [place];  Service: Gastroenterology;  Laterality: N/A;   EYE SURGERY Bilateral    cataracts   HEMODIALYSIS INPATIENT  01/17/2018       HOT HEMOSTASIS N/A 06/03/2018   Procedure: HOT HEMOSTASIS (ARGON PLASMA COAGULATION/BICAP);  Surgeon: Lavena Bullion, DO;  Location: [facility] ENDOSCOPY;  Service: Gastroenterology;  Laterality: N/A;   HOT HEMOSTASIS N/A 11/20/2018   Procedure: HOT HEMOSTASIS (ARGON PLASMA COAGULATION/BICAP);  Surgeon: Irving Copas., MD;  Location: [place];  Service: Gastroenterology;  Laterality: N/A;   REVISON OF ARTERIOVENOUS FISTULA Left 02/09/2014   Procedure: REVISON OF LEFT ARTERIOVENOUS FISTULA - RESECTION OF RENDUNDANT VEIN;  Surgeon: Angelia Mould, MD;  Location: [place];  Service: Vascular;  Laterality: Left;   SBO with lysis adhesions     SHUNTOGRAM Left 04/19/2014   Procedure: FISTULOGRAM;  Surgeon: Angelia Mould, MD;  Location: [facility] CATH LAB;  Service: Cardiovascular;  Laterality: Left;   UNILATERAL UPPER EXTREMEITY ANGIOGRAM N/A 07/12/2014   Procedure: UNILATERAL UPPER Anselmo Rod;  Surgeon: Angelia Mould, MD;  Location: [facility] CATH LAB;  Service: Cardiovascular;  Laterality: N/A;   Family History  Problem  Relation Age of Onset   Aneurysm Mother    Heart disease Father    Stroke Father    Hypertension Father    Diabetes Father    Dementia Neg Hx    Colon cancer Neg Hx    Esophageal cancer Neg Hx    Pancreatic cancer Neg Hx    Prostate cancer Neg Hx    Rectal cancer Neg Hx    Stomach cancer Neg Hx    Social History   Tobacco Use   Smoking status: Former Smoker    Last attempt to quit: 09/24/1994    Years since quitting: 24.4   Smokeless tobacco: Never Used  Substance Use Topics   Alcohol use: No    Alcohol/week: 0.0 standard drinks    Comment: Quit Oct. 1977  ("somewhat heavy")   Drug use: No   Current Outpatient Medications  Medication Sig Dispense Refill   acetaminophen (TYLENOL) 325 MG tablet Take 1-2 tablets (325-650 mg total) by mouth every 4 (four) hours as needed for mild pain.     allopurinol (ZYLOPRIM) 100 MG tablet Take 100 mg by mouth daily.      Blood Glucose Monitoring Suppl (FREESTYLE FREEDOM LITE) w/Device KIT Use to check blood sugar 2 times per day dx code E11.65 1 each 0   Carboxymethylcellulose Sod PF 0.25 % SOLN Place 1 drop into both eyes as needed (dry eyes).     clobetasol (TEMOVATE) 0.05 % external solution Apply 1 application topically 2 (two) times daily as needed (irritation). Apply to scalp  0   denosumab (PROLIA) 60 MG/ML SOSY injection Inject 60 mg into the skin every 6 (six) months.     docusate sodium (COLACE) 100 MG capsule Take 1 capsule (100 mg total) by mouth daily. (Patient taking differently: Take 100 mg by mouth daily as needed for mild constipation. ) 30 capsule 0   FLUOCINOLONE ACETONIDE SCALP 0.01 % OIL Apply 1 application topically at bedtime. Apply to scalp  0   fluticasone (CUTIVATE) 0.05 % cream Apply 1 application topically 2 (two) times daily as needed (irritation). Apply to face  0   fluticasone (FLONASE) 50 MCG/ACT nasal spray Place 1 spray into both nostrils daily. (Patient taking differently: Place 1 spray into both nostrils daily as needed for allergies. ) 16 g 2   glucose blood (FREESTYLE LITE) test strip Use as instructed to check blood sugar 3 times daily. 200 each 12   hydrOXYzine (VISTARIL) 25 MG capsule Take 1 capsule by mouth every 6 (six) hours as needed.     insulin aspart (NOVOLOG FLEXPEN) 100 UNIT/ML FlexPen INJECT 6-8 UNITS UNDER THE SKIN THREE TIMES DAILY BEFORE MEALS. DX:E11.65 (Patient taking differently: INJECT 4 BEFORE BREAKFAST AND LUNCH AND 6 UNITS BEFORE DINNER UNITS UNDER THE SKIN THREE TIMES DAILY BEFORE MEALS. DX:E11.65) 15 mL 12   Insulin Pen Needle (PEN NEEDLES)  32G X 4 MM MISC 1 each by Does not apply route 3 (three) times daily. USE PEN NEEDLES TO INJECT INSULIN THREE TIMES DAILY. 100 each 12   ketoconazole (NIZORAL) 2 % shampoo Apply 1 application topically 2 (two) times a week. (Patient taking differently: Apply 1 application topically at bedtime. ) 120 mL 0   Lancets (FREESTYLE) lancets Use as instructed to check blood sugar 2 times per day dx code E11.65 100 each 3   levothyroxine (SYNTHROID, LEVOTHROID) 50 MCG tablet Take 1 tablet (50 mcg total) by mouth daily. 90 tablet 1   memantine (NAMENDA) 10 MG tablet TAKE  1 TABLET(10 MG) BY MOUTH TWICE DAILY 60 tablet 7   midodrine (PROAMATINE) 10 MG tablet Take one pill prior to hemodialysis on MWF     Naftifine HCl (NAFTIN) 2 % CREA Apply to feet daily for fungal infection (Patient taking differently: Apply 1 application topically daily as needed (for fungal infection). Apply to feet) 60 g prn   Nutritional Supplements (FEEDING SUPPLEMENT, NEPRO CARB STEADY,) LIQD Take 237 mLs by mouth daily.     omeprazole (PRILOSEC) 40 MG capsule Take 1 capsule (40 mg total) by mouth daily. 30 capsule 0   polyethylene glycol (MIRALAX / GLYCOLAX) packet Take 17 g by mouth daily. (Patient taking differently: Take 17 g by mouth daily as needed for mild constipation. ) 14 each 0   sevelamer carbonate (RENVELA) 800 MG tablet Take 1,600 mg by mouth 3 (three) times daily before meals.      simvastatin (ZOCOR) 20 MG tablet Take 20 mg by mouth at bedtime.      No current facility-administered medications for this visit.    Allergies  Allergen Reactions   Ambien [Zolpidem Tartrate] Other (See Comments)    hallucinations and "felt crazy"    Penicillins Rash    Has patient had a PCN reaction causing immediate rash, facial/tongue/throat swelling, SOB or lightheadedness with hypotension: Yes Has patient had a PCN reaction causing severe rash involving mucus membranes or skin necrosis: Yes Has patient had a PCN reaction  that required hospitalization: No Has patient had a PCN reaction occurring within the last 10 years: No If all of the above answers are "NO", then may proceed with Cephalosporin use.      Review of Systems: All systems reviewed and negative except where noted in HPI.     Physical Exam:    Complete physical exam not completed due to the nature of this telehealth communication.   Gen: Awake, alert, and oriented, and well communicative. HEENT: EOMI, non-icteric sclera, NCAT, MMM Neck: Normal movement of head and neck Pulm: No labored breathing, speaking in full sentences without conversational dyspnea Derm: No apparent lesions or bruising in visible field MS: Moves all visible extremities without noticeable abnormality Psych: Pleasant, cooperative, normal speech, thought processing seemingly intact   ASSESSMENT AND PLAN;   1) Small Bowel and Gastric AVMs: History of small bowel AVMs requiring admissions in the past for PRBC transfusions and endoscopic intervention, most recently in 10/2018.  Has been doing well since hospital discharge, and has received a couple doses of IV iron at HD.  Discussed further evaluation and treatment options as outlined below:  - Plan for VCE now that COVID-19 pandemic related restrictions improved - Holding off on repeat colonoscopy for now, as this was done in 2019 and without colonic AVMs -Continue serial H/H checks with HD and IV iron infusion per protocol - Previously did discuss medical treatment (i.e. octreotide, thalidomide, hormone therapy), which he would like to forego owing to the side effect profile, which seems reasonable.   - If VCE positive for additional lesions within reach, may need to consider single balloon enteroscopy as necessary -Continue to observe for evidence of overt GI blood loss -Has since restarted ASA 81 mg  2) Chronic Anemia: Multifactorial secondary to ESRD and small bowel AVMs as above.  Treating as above.  3) ESRD  on Hemodialysis: Receives regular HD.  Routine serial H/H checks at HD as above.  4) Chronic Constipation: History of constipation, which has been generally well controlled with titrating doses of daily MiraLAX.  -  Increased MiraLAX back to 1/day -Resume fiber supplement - Adding Colace nightly and titrate to effect - May consider changing to Amitiza, Linzess, etc pending response  5) GERD: Well-controlled on current therapy.  No plan to change therapy at this time.  6) History of Colon Polyps: Last colonoscopy in 10/2017 with 9 mm TA.  No plan for repeat colonoscopy for ongoing surveillance given age and comorbidities.  7) History of Subdural Hematoma: -Restarted ASA per Neurosurgery - No plan for repeat neurosurgical intervention at this time  I spent a total of 25 minutes of face-to-face time with the patient and his wife. Greater than 50% of the time was spent counseling and coordinating care.   The indications, risks, and benefits of Video Capsule Endoscopy were explained to the patient in detail. Risks include but are not limited to capsule retention requiring endoscopic or surgical retrieval (occurs in 1-2%), failure to reach cecum prior to capsule battery expiration (ie, incomplete study), or inadequate visualization (ie, non-diagnostic study). The patient verbalized understanding and wished to proceed. All questions answered, referred to scheduler. Further recommendations pending results of the exam.     Lavena Bullion, DO, FACG  03/03/2019, 11:01 AM   [place], Cresenciano Lick, MD

## 2019-03-04 DIAGNOSIS — D631 Anemia in chronic kidney disease: Secondary | ICD-10-CM | POA: Diagnosis not present

## 2019-03-04 DIAGNOSIS — N2581 Secondary hyperparathyroidism of renal origin: Secondary | ICD-10-CM | POA: Diagnosis not present

## 2019-03-04 DIAGNOSIS — N186 End stage renal disease: Secondary | ICD-10-CM | POA: Diagnosis not present

## 2019-03-04 DIAGNOSIS — E1129 Type 2 diabetes mellitus with other diabetic kidney complication: Secondary | ICD-10-CM | POA: Diagnosis not present

## 2019-03-04 DIAGNOSIS — E119 Type 2 diabetes mellitus without complications: Secondary | ICD-10-CM | POA: Diagnosis not present

## 2019-03-04 DIAGNOSIS — D509 Iron deficiency anemia, unspecified: Secondary | ICD-10-CM | POA: Diagnosis not present

## 2019-03-06 DIAGNOSIS — D509 Iron deficiency anemia, unspecified: Secondary | ICD-10-CM | POA: Diagnosis not present

## 2019-03-06 DIAGNOSIS — N2581 Secondary hyperparathyroidism of renal origin: Secondary | ICD-10-CM | POA: Diagnosis not present

## 2019-03-06 DIAGNOSIS — D631 Anemia in chronic kidney disease: Secondary | ICD-10-CM | POA: Diagnosis not present

## 2019-03-06 DIAGNOSIS — E119 Type 2 diabetes mellitus without complications: Secondary | ICD-10-CM | POA: Diagnosis not present

## 2019-03-06 DIAGNOSIS — N186 End stage renal disease: Secondary | ICD-10-CM | POA: Diagnosis not present

## 2019-03-06 DIAGNOSIS — E1129 Type 2 diabetes mellitus with other diabetic kidney complication: Secondary | ICD-10-CM | POA: Diagnosis not present

## 2019-03-09 ENCOUNTER — Telehealth: Payer: Self-pay

## 2019-03-09 DIAGNOSIS — D631 Anemia in chronic kidney disease: Secondary | ICD-10-CM | POA: Diagnosis not present

## 2019-03-09 DIAGNOSIS — E119 Type 2 diabetes mellitus without complications: Secondary | ICD-10-CM | POA: Diagnosis not present

## 2019-03-09 DIAGNOSIS — E1129 Type 2 diabetes mellitus with other diabetic kidney complication: Secondary | ICD-10-CM | POA: Diagnosis not present

## 2019-03-09 DIAGNOSIS — D509 Iron deficiency anemia, unspecified: Secondary | ICD-10-CM | POA: Diagnosis not present

## 2019-03-09 DIAGNOSIS — N186 End stage renal disease: Secondary | ICD-10-CM | POA: Diagnosis not present

## 2019-03-09 DIAGNOSIS — N2581 Secondary hyperparathyroidism of renal origin: Secondary | ICD-10-CM | POA: Diagnosis not present

## 2019-03-09 NOTE — Telephone Encounter (Signed)
Patient has been verified for Prol[employer] and I am waiting for Summary of Benefits to come back

## 2019-03-11 ENCOUNTER — Other Ambulatory Visit: Payer: Self-pay

## 2019-03-11 ENCOUNTER — Encounter (HOSPITAL_COMMUNITY): Payer: Self-pay | Admitting: Emergency Medicine

## 2019-03-11 ENCOUNTER — Emergency Department (HOSPITAL_COMMUNITY)
Admission: EM | Admit: 2019-03-11 | Discharge: 2019-03-11 | Disposition: A | Discharge status: Home / Self Care | Payer: Medicare Other | Attending: Emergency Medicine | Admitting: Emergency Medicine

## 2019-03-11 ENCOUNTER — Emergency Department (HOSPITAL_COMMUNITY): Payer: Medicare Other

## 2019-03-11 DIAGNOSIS — Y999 Unspecified external cause status: Secondary | ICD-10-CM | POA: Diagnosis not present

## 2019-03-11 DIAGNOSIS — N186 End stage renal disease: Secondary | ICD-10-CM | POA: Diagnosis not present

## 2019-03-11 DIAGNOSIS — S46912A Strain of unspecified muscle, fascia and tendon at shoulder and upper arm level, left arm, initial encounter: Secondary | ICD-10-CM

## 2019-03-11 DIAGNOSIS — W19XXXA Unspecified fall, initial encounter: Secondary | ICD-10-CM | POA: Diagnosis not present

## 2019-03-11 DIAGNOSIS — S299XXA Unspecified injury of thorax, initial encounter: Secondary | ICD-10-CM | POA: Diagnosis not present

## 2019-03-11 DIAGNOSIS — W010XXA Fall on same level from slipping, tripping and stumbling without subsequent striking against object, initial encounter: Secondary | ICD-10-CM | POA: Insufficient documentation

## 2019-03-11 DIAGNOSIS — Z992 Dependence on renal dialysis: Secondary | ICD-10-CM | POA: Insufficient documentation

## 2019-03-11 DIAGNOSIS — Y93E8 Activity, other personal hygiene: Secondary | ICD-10-CM | POA: Insufficient documentation

## 2019-03-11 DIAGNOSIS — Z87891 Personal history of nicotine dependence: Secondary | ICD-10-CM | POA: Diagnosis not present

## 2019-03-11 DIAGNOSIS — F0390 Unspecified dementia without behavioral disturbance: Secondary | ICD-10-CM | POA: Insufficient documentation

## 2019-03-11 DIAGNOSIS — M79622 Pain in left upper arm: Secondary | ICD-10-CM | POA: Diagnosis not present

## 2019-03-11 DIAGNOSIS — R52 Pain, unspecified: Secondary | ICD-10-CM | POA: Diagnosis not present

## 2019-03-11 DIAGNOSIS — S46812A Strain of other muscles, fascia and tendons at shoulder and upper arm level, left arm, initial encounter: Secondary | ICD-10-CM | POA: Diagnosis not present

## 2019-03-11 DIAGNOSIS — Z794 Long term (current) use of insulin: Secondary | ICD-10-CM | POA: Diagnosis not present

## 2019-03-11 DIAGNOSIS — I10 Essential (primary) hypertension: Secondary | ICD-10-CM | POA: Diagnosis not present

## 2019-03-11 DIAGNOSIS — S20212A Contusion of left front wall of thorax, initial encounter: Secondary | ICD-10-CM | POA: Insufficient documentation

## 2019-03-11 DIAGNOSIS — Y92002 Bathroom of unspecified non-institutional (private) residence single-family (private) house as the place of occurrence of the external cause: Secondary | ICD-10-CM | POA: Insufficient documentation

## 2019-03-11 DIAGNOSIS — I120 Hypertensive chronic kidney disease with stage 5 chronic kidney disease or end stage renal disease: Secondary | ICD-10-CM | POA: Insufficient documentation

## 2019-03-11 DIAGNOSIS — S4992XA Unspecified injury of left shoulder and upper arm, initial encounter: Secondary | ICD-10-CM | POA: Diagnosis present

## 2019-03-11 DIAGNOSIS — R0789 Other chest pain: Secondary | ICD-10-CM | POA: Diagnosis not present

## 2019-03-11 DIAGNOSIS — E1122 Type 2 diabetes mellitus with diabetic chronic kidney disease: Secondary | ICD-10-CM | POA: Diagnosis not present

## 2019-03-11 LAB — CBG MONITORING, ED: Glucose-Capillary: 150 mg/dL — ABNORMAL HIGH (ref 70–99)

## 2019-03-11 MED ORDER — FENTANYL CITRATE (PF) 100 MCG/2ML IJ SOLN: 50.0000 ug | Freq: Once | INTRAMUSCULAR | Status: DC

## 2019-03-11 MED ORDER — OXYCODONE HCL 5 MG PO TABS
5.0000 mg | ORAL_TABLET | Freq: Once | ORAL | Status: AC
  Administered 2019-03-11: 5 mg via ORAL
  Filled 2019-03-11: qty 1

## 2019-03-11 MED ORDER — ONDANSETRON HCL 4 MG/2ML IJ SOLN: 4.0000 mg | Freq: Once | INTRAMUSCULAR | Status: DC

## 2019-03-11 MED ORDER — HYDROCODONE-ACETAMINOPHEN 5-325 MG PO TABS: 1.0000 | ORAL_TABLET | ORAL | 0 refills | Status: DC | PRN

## 2019-03-11 NOTE — ED Notes (Addendum)
Pt a difficult IV stick, MD ok with holding off on IV for now and changed pain meds to PO.

## 2019-03-11 NOTE — ED Triage Notes (Signed)
Pt arrives ems with a fall. Pt was pulling pants down to use the bathroom and slipped a fell backwards. No LOF. Denies being on blood thinners. Dialysis pt. Pt co left shoulder pain.

## 2019-03-11 NOTE — ED Triage Notes (Signed)
TC to Pt wife ,[name] @ [phone] . This Probation officer reported up date on Pt . Plan to call Wife when results.

## 2019-03-11 NOTE — ED Provider Notes (Signed)
[facility] EMERGENCY DEPARTMENT Provider Note   CSN: [id number] Arrival date & time: 03/11/19  1145    History   Chief Complaint Chief Complaint  Patient presents with  . Fall    HPI [name] is a 78 y.o. male.     Pt presents to the ED today with left shoulder pain.  Pt said he was pulling his pants down to use the bathroom and slipped and fell backwards.  He said his left shoulder (points to his upper arm) has been hurting since the fall.  Fall occurred early this morning.  Pt denies hitting his head.  No loc.  No blood thinners.     Past Medical History:  Diagnosis Date  . Allergy   . Anemia   . Arthritis   . Cataract    bil cateracts removed  . Coronary artery disease   . Dementia arising in the senium and presenium ([place])   . Diabetes mellitus    Type 2  . Diverticulitis   . ED (erectile dysfunction)   . Elevated homocysteine ([place])   . ESRD (end stage renal disease) on dialysis ([place]) 03/2015  . GERD (gastroesophageal reflux disease)    pepto   . Gout   . Hyperlipidemia   . Hypertension   . Hypothyroidism   . Pneumonia   . PVD (peripheral vascular disease) ([place])    has plastic aorta  . Seasonal allergies   . Sleep apnea    does not wear c-pap    Patient Active Problem List   Diagnosis Date Noted  . Melena 11/19/2018  . Leukocytosis   . Anemia of chronic disease   . TBI (traumatic brain injury) ([place]) 07/29/2018  . 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 Dementia arising in the senium and presenium ([place])   . 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 Multiple fractures of ribs, left side, initial encounter for closed fracture 07/21/2018  . Subdural hematoma ([place]) 07/21/2018  . Angiodysplasia of stomach   . Occult GI bleeding   . GIB (gastrointestinal bleeding) 06/01/2018  . Low back pain 01/17/2018  . Anorexia   . Dyspnea   . Macrocytic anemia 06/04/2016  . Thrombocytopenia ([place])  06/04/2016  . Exertional dyspnea 06/04/2016  . Arm paresthesia, left 06/04/2016  . Dyspnea on exertion 06/04/2016  . Tenderness of right calf 06/04/2016  . Mild cognitive impairment with memory loss 01/12/2016  . ESRD on dialysis ([place]) 05/17/2015  . Hypoglycemia   . Weakness 02/18/2015  . OSA (obstructive sleep apnea) 02/02/2015  . Hyperkalemia 10/05/2014  . End stage renal disease ([place]) 11/18/2013  . BPH (benign prostatic hyperplasia) 11/22/2012  . Peripheral vascular disease ([place]) 12/24/2011  . Hyperparathyroidism ([place]) 12/24/2011  . Hypothyroidism 12/24/2011  . Allergic rhinitis 12/24/2011  . Erectile dysfunction 12/24/2011  . Insulin dependent diabetes mellitus ([place]) 09/03/2008  . Hyperlipidemia 09/03/2008  . Essential hypertension 08/30/2008  . PANCREATITIS, HX OF 08/30/2008  . RENAL FAILURE, ACUTE, HX OF 08/30/2008  . DIVERTICULOSIS, COLON 06/08/2003    Past Surgical History:  Procedure Laterality Date  . aortobifemoral bypass    . AV FISTULA PLACEMENT Left 12/01/2013   Procedure: ARTERIOVENOUS (AV) FISTULA CREATION- LEFT BRACHIOCEPHALIC;  Surgeon: Angelia Mould, MD;  Location: [facility];  Service: Vascular;  Laterality: Left;  . [place] TRANSPOSITION Right 07/27/2014   Procedure: BASCILIC VEIN TRANSPOSITION;  Surgeon: Angelia Mould, MD;  Location: [facility];  Service: Vascular;  Laterality: Right;  . BRAIN SURGERY  07/22/2018  . BREAST SURGERY     left - granulomatous mastitis  . BURR HOLE Bilateral 07/22/2018   Procedure: BILATERAL BURR HOLES;  Surgeon: Kristeen Miss, MD;  Location: [facility];  Service: Neurosurgery;  Laterality: Bilateral;  . COLONOSCOPY    . 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 ENTEROSCOPY N/A 06/03/2018   Procedure: ENTEROSCOPY;  Surgeon: Lavena Bullion, DO;  Location: [facility];  Service: Gastroenterology;  Laterality: N/A;  . ENTEROSCOPY N/A 11/20/2018   Procedure: ENTEROSCOPY;  Surgeon: Rush Landmark Telford Nab., MD;   Location: [facility] ENDOSCOPY;  Service: Gastroenterology;  Laterality: N/A;  . EYE SURGERY Bilateral    cataracts  . HEMODIALYSIS INPATIENT  01/17/2018      . HOT HEMOSTASIS N/A 06/03/2018   Procedure: HOT HEMOSTASIS (ARGON PLASMA COAGULATION/BICAP);  Surgeon: Lavena Bullion, DO;  Location: [facility] ENDOSCOPY;  Service: Gastroenterology;  Laterality: N/A;  . HOT HEMOSTASIS N/A 11/20/2018   Procedure: HOT HEMOSTASIS (ARGON PLASMA COAGULATION/BICAP);  Surgeon: Irving Copas., MD;  Location: [facility];  Service: Gastroenterology;  Laterality: N/A;  . REVISON OF ARTERIOVENOUS FISTULA Left 02/09/2014   Procedure: REVISON OF LEFT ARTERIOVENOUS FISTULA - RESECTION OF RENDUNDANT VEIN;  Surgeon: Angelia Mould, MD;  Location: [facility];  Service: Vascular;  Laterality: Left;  . SBO with lysis adhesions    . SHUNTOGRAM Left 04/19/2014   Procedure: FISTULOGRAM;  Surgeon: Angelia Mould, MD;  Location[facility] C[facility] LAB;  Service: Cardiovascular;  Laterality: Left;  . UNILATERAL UPPER EXTREMEITY ANGIOGRAM N/A 07/12/2014   Procedure: UNILATERAL UPPER Anselmo Rod;  Surgeon: Angelia Mould, MD;  Location: [facility] CATH LAB;  Service: Cardiovascular;  Laterality: N/A;        Home Medications    Prior to Admission medications   Medication Sig Start Date End Date Taking? Authorizing Provider  acetaminophen (TYLENOL) 325 MG tablet Take 1-2 tablets (325-650 mg total) by mouth every 4 (four) hours as needed for mild pain. 08/01/18  Yes Love, Ivan Anchors, PA-C  allopurinol (ZYLOPRIM) 100 MG tablet Take 100 mg by mouth daily.    Yes [provider]  Carboxymethylcellulose Sod PF 0.25 % SOLN Place 1 drop into both eyes as needed (dry eyes).   Yes [provider]  clobetasol (TEMOVATE) 0.05 % external solution Apply 1 application topically 2 (two) times daily as needed (irritation). Apply to scalp 04/19/15  Yes [provider]  denosumab (PROLIA) 60 MG/ML SOSY injection Inject  60 mg into the skin every 6 (six) months.   Yes [provider]  docusate sodium (COLACE) 100 MG capsule Take 1 capsule (100 mg total) by mouth daily. Patient taking differently: Take 100 mg by mouth daily as needed for mild constipation.  08/13/18  Yes Love, Ivan Anchors, PA-C  donepezil (ARICEPT) 10 MG tablet Take 10 mg by mouth 2 (two) times a day.  01/13/19  Yes [provider]  FLUOCINOLONE ACETONIDE SCALP 0.01 % OIL Apply 1 application topically at bedtime. Apply to scalp 04/28/15  Yes [provider]  fluticasone (CUTIVATE) 0.05 % cream Apply 1 application topically 2 (two) times daily as needed (irritation). Apply to face 04/19/15  Yes [provider]  fluticasone (FLONASE) 50 MCG/ACT nasal spray Place 1 spray into both nostrils daily. Patient taking differently: Place 1 spray into both nostrils daily as needed for allergies.  10/08/14  Yes Mikhail, [place], DO  hydrocortisone 2.5 % cream Apply 1 application  topically as needed for after feeds. Legs and arms 01/13/19  Yes [provider]  hydrOXYzine (VISTARIL) 25 MG capsule Take 1 capsule by mouth every 6 (six) hours as needed for itching.  01/13/19  Yes [provider]  insulin aspart (NOVOLOG FLEXPEN) 100 UNIT/ML FlexPen INJECT 6-8 UNITS UNDER THE SKIN THREE TIMES DAILY BEFORE MEALS. DX:E11.65 Patient taking differently: Inject 4-6 Units into the skin 3 (three) times daily with meals. INJECT 4 Unit BEFORE BREAKFAST and lunch   Inject 6 UNITS BEFORE DINNER UNITS UNDER THE SKIN THREE TIMES DAILY BEFORE MEALS. DX:E11.65 11/25/18  Yes Elayne Snare, MD  ketoconazole (NIZORAL) 2 % shampoo Apply 1 application topically 2 (two) times a week. Patient taking differently: Apply 1 application topically daily as needed for irritation.  03/31/15  Yes Baxley, Cresenciano Lick, MD  levothyroxine (SYNTHROID, LEVOTHROID) 50 MCG tablet Take 1 tablet (50 mcg total) by mouth daily. 08/06/14  Yes Baxley, Cresenciano Lick, MD  MEN-PHOR lotion  Apply 1 application topically daily.  01/13/19  Yes [provider]  midodrine (PROAMATINE) 10 MG tablet Take one pill prior to hemodialysis on MWF Patient taking differently: Take 10 mg by mouth every Monday, Wednesday, and Friday. Take one pill prior to hemodialysis 08/13/18  Yes Love, Ivan Anchors, PA-C  Naftifine HCl (NAFTIN) 2 % CREA Apply to feet daily for fungal infection Patient taking differently: Apply 1 application topically daily as needed (for fungal infection). Apply to feet 09/12/15  Yes Baxley, Cresenciano Lick, MD  Nutritional Supplements (FEEDING SUPPLEMENT, NEPRO CARB STEADY,) LIQD Take 237 mLs by mouth daily. Take 237 mg - 474 ml per day   Yes [provider]  omeprazole (PRILOSEC) 40 MG capsule Take 1 capsule (40 mg total) by mouth daily. 03/13/18  Yes [place], Amy S, PA-C  polyethylene glycol (MIRALAX / GLYCOLAX) packet Take 17 g by mouth daily. Patient taking differently: Take 17 g by mouth daily as needed for mild constipation.  08/13/18  Yes Love, Ivan Anchors, PA-C  sevelamer carbonate (RENVELA) 800 MG tablet Take 1,600 mg by mouth 3 (three) times daily before meals.    Yes [provider]  simvastatin (ZOCOR) 20 MG tablet Take 20 mg by mouth at bedtime.    Yes [provider]  TRADJENTA 5 MG TABS tablet Take 5 mg by mouth daily. 01/16/19  Yes [provider]  Blood Glucose Monitoring Suppl (FREESTYLE FREEDOM LITE) w/Device KIT Use to check blood sugar 2 times per day dx code E11.65 10/21/18   Elayne Snare, MD  glucose blood (FREESTYLE LITE) test strip Use as instructed to check blood sugar 3 times daily. 04/22/18   Elayne Snare, MD  HYDROcodone-acetaminophen (NORCO/VICODIN) 5-325 MG tablet Take 1 tablet by mouth every 4 (four) hours as needed. 03/11/19   Isla Pence, MD  Insulin Pen Needle (PEN NEEDLES) 32G X 4 MM MISC 1 each by Does not apply route 3 (three) times daily. USE PEN NEEDLES TO INJECT INSULIN THREE TIMES DAILY. 11/25/18   Elayne Snare, MD   Lancets (FREESTYLE) lancets Use as instructed to check blood sugar 2 times per day dx code E11.65 02/22/16   Elayne Snare, MD  memantine (NAMENDA) 10 MG tablet TAKE 1 TABLET(10 MG) BY MOUTH TWICE DAILY Patient not taking: Reported on 03/11/2019 12/02/18   Dennie Bible, NP    Family History Family History  Problem Relation Age of Onset  . Aneurysm Mother   . Heart disease Father   . Stroke Father   .  Hypertension Father   . Diabetes Father   . Dementia Neg Hx   . Colon cancer Neg Hx   . Esophageal cancer Neg Hx   . Pancreatic cancer Neg Hx   . Prostate cancer Neg Hx   . Rectal cancer Neg Hx   . Stomach cancer Neg Hx     Social History Social History   Tobacco Use  . Smoking status: Former Smoker    Quit date: 09/24/1994    Years since quitting: 24.4  . Smokeless tobacco: Never Used  Substance Use Topics  . Alcohol use: No    Alcohol/week: 0.0 standard drinks    Comment: Quit Oct. 1977 ("somewhat heavy")  . Drug use: No     Allergies   Ambien [zolpidem tartrate] and Penicillins   Review of Systems Review of Systems  Musculoskeletal:       Left upper arm pain  All other systems reviewed and are negative.    Physical Exam Updated Vital Signs BP (!) 138/47 (BP Location: Left Arm)   Pulse 71   Temp 97.6 F (36.4 C) (Oral)   Resp 17   Ht 6' 2"  (1.88 m)   Wt 70.8 kg   SpO2 100%   BMI 20.04 kg/m   Physical Exam Vitals signs and nursing note reviewed.  Constitutional:      Appearance: Normal appearance.  HENT:     Head: Normocephalic and atraumatic.     Right Ear: External ear normal.     Left Ear: External ear normal.     Nose: Nose normal.     Mouth/Throat:     Mouth: Mucous membranes are moist.     Pharynx: Oropharynx is clear.  Eyes:     Extraocular Movements: Extraocular movements intact.     Conjunctiva/sclera: Conjunctivae normal.     Pupils: Pupils are equal, round, and reactive to light.  Neck:     Musculoskeletal: Normal range of  motion and neck supple.  Cardiovascular:     Rate and Rhythm: Normal rate and regular rhythm.     Pulses: Normal pulses.     Heart sounds: Normal heart sounds.  Pulmonary:     Effort: Pulmonary effort is normal.     Breath sounds: Normal breath sounds.  Abdominal:     General: Abdomen is flat. Bowel sounds are normal.     Palpations: Abdomen is soft.  Musculoskeletal:       Arms:  Skin:    General: Skin is warm and dry.     Capillary Refill: Capillary refill takes less than 2 seconds.  Neurological:     General: No focal deficit present.     Mental Status: He is alert and oriented to person, place, and time.  Psychiatric:        Mood and Affect: Mood normal.        Behavior: Behavior normal.        Thought Content: Thought content normal.        Judgment: Judgment normal.      ED Treatments / Results  Labs (all labs ordered are listed, but only abnormal results are displayed) Labs Reviewed  CBG MONITORING, ED - Abnormal; Notable for the following components:      Result Value   Glucose-Capillary 150 (*)    All other components within normal limits    EKG None  Radiology Dg Ribs Unilateral W/chest Left  Result Date: 03/11/2019 CLINICAL DATA:  Left-sided pain since last night.  Fall. EXAM: LEFT RIBS  AND CHEST - 3+ VIEW COMPARISON:  July 20, 2018 FINDINGS: The cardiomediastinal silhouette is normal. No pneumothorax. No nodule, mass, or pneumothorax. No fractures. IMPRESSION: Negative. Electronically Signed   By: Dorise Bullion III M.D   On: 03/11/2019 13:52   Dg Humerus Left  Result Date: 03/11/2019 CLINICAL DATA:  Left-sided rib pain and upper arm pain. EXAM: LEFT HUMERUS - 2+ VIEW COMPARISON:  June 05, 2016. FINDINGS: No fractures. IMPRESSION: Negative. Electronically Signed   By: Dorise Bullion III M.D   On: 03/11/2019 13:54    Procedures Procedures (including critical care time)  Medications Ordered in ED Medications  fentaNYL (SUBLIMAZE) injection  50 mcg (has no administration in time range)  ondansetron (ZOFRAN) injection 4 mg (has no administration in time range)  oxyCODONE (Oxy IR/ROXICODONE) immediate release tablet 5 mg (5 mg Oral Given 03/11/19 1231)     Initial Impression / Assessment and Plan / ED Course  I have reviewed the triage vital signs and the nursing notes.  Pertinent labs & imaging results that were available during my care of the patient were reviewed by me and considered in my medical decision making (see chart for details).       Pt is placed in a shoulder sling.  He is instructed to f/u with ortho.  Return if worse.  Final Clinical Impressions(s) / ED Diagnoses   Final diagnoses:  Fall, initial encounter  Strain of left shoulder, initial encounter  Contusion of rib on left side, initial encounter    ED Discharge Orders         Ordered    HYDROcodone-acetaminophen (NORCO/VICODIN) 5-325 MG tablet  Every 4 hours PRN     03/11/19 1412           Isla Pence, MD 03/11/19 1416

## 2019-03-12 DIAGNOSIS — D631 Anemia in chronic kidney disease: Secondary | ICD-10-CM | POA: Diagnosis not present

## 2019-03-12 DIAGNOSIS — E119 Type 2 diabetes mellitus without complications: Secondary | ICD-10-CM | POA: Diagnosis not present

## 2019-03-12 DIAGNOSIS — E1129 Type 2 diabetes mellitus with other diabetic kidney complication: Secondary | ICD-10-CM | POA: Diagnosis not present

## 2019-03-12 DIAGNOSIS — N186 End stage renal disease: Secondary | ICD-10-CM | POA: Diagnosis not present

## 2019-03-12 DIAGNOSIS — N2581 Secondary hyperparathyroidism of renal origin: Secondary | ICD-10-CM | POA: Diagnosis not present

## 2019-03-12 DIAGNOSIS — D509 Iron deficiency anemia, unspecified: Secondary | ICD-10-CM | POA: Diagnosis not present

## 2019-03-13 DIAGNOSIS — N2581 Secondary hyperparathyroidism of renal origin: Secondary | ICD-10-CM | POA: Diagnosis not present

## 2019-03-13 DIAGNOSIS — D509 Iron deficiency anemia, unspecified: Secondary | ICD-10-CM | POA: Diagnosis not present

## 2019-03-13 DIAGNOSIS — N186 End stage renal disease: Secondary | ICD-10-CM | POA: Diagnosis not present

## 2019-03-13 DIAGNOSIS — E119 Type 2 diabetes mellitus without complications: Secondary | ICD-10-CM | POA: Diagnosis not present

## 2019-03-13 DIAGNOSIS — E1129 Type 2 diabetes mellitus with other diabetic kidney complication: Secondary | ICD-10-CM | POA: Diagnosis not present

## 2019-03-13 DIAGNOSIS — D631 Anemia in chronic kidney disease: Secondary | ICD-10-CM | POA: Diagnosis not present

## 2019-03-16 DIAGNOSIS — E119 Type 2 diabetes mellitus without complications: Secondary | ICD-10-CM | POA: Diagnosis not present

## 2019-03-16 DIAGNOSIS — N186 End stage renal disease: Secondary | ICD-10-CM | POA: Diagnosis not present

## 2019-03-16 DIAGNOSIS — D509 Iron deficiency anemia, unspecified: Secondary | ICD-10-CM | POA: Diagnosis not present

## 2019-03-16 DIAGNOSIS — D631 Anemia in chronic kidney disease: Secondary | ICD-10-CM | POA: Diagnosis not present

## 2019-03-16 DIAGNOSIS — E1129 Type 2 diabetes mellitus with other diabetic kidney complication: Secondary | ICD-10-CM | POA: Diagnosis not present

## 2019-03-16 DIAGNOSIS — N2581 Secondary hyperparathyroidism of renal origin: Secondary | ICD-10-CM | POA: Diagnosis not present

## 2019-03-17 ENCOUNTER — Ambulatory Visit (INDEPENDENT_AMBULATORY_CARE_PROVIDER_SITE_OTHER): Payer: Medicare Other | Admitting: Gastroenterology

## 2019-03-17 ENCOUNTER — Encounter: Payer: Self-pay | Admitting: Gastroenterology

## 2019-03-17 DIAGNOSIS — K5520 Angiodysplasia of colon without hemorrhage: Secondary | ICD-10-CM | POA: Diagnosis not present

## 2019-03-17 DIAGNOSIS — D500 Iron deficiency anemia secondary to blood loss (chronic): Secondary | ICD-10-CM

## 2019-03-17 NOTE — Progress Notes (Addendum)
Patient states prep went well. Patient swallowed capsule without difficulty. Gave verbal and written instructions to patient and wife.   Addendum: VCE completed and notable for the following:  Findings: - Complete study, with adequate prep -First duodenal image at 15 minutes 49 seconds - A few tiny red spots noted in the mid small bowel at 2 hours 30 minutes, without active bleeding.  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  If rebleeding, would consider referral for retrograde double-balloon enteroscopy.  Otherwise observation with medical management as above -Can follow-up with Dr. Bryan Lemma in the GI clinic as previously scheduled

## 2019-03-18 DIAGNOSIS — E119 Type 2 diabetes mellitus without complications: Secondary | ICD-10-CM | POA: Diagnosis not present

## 2019-03-18 DIAGNOSIS — D509 Iron deficiency anemia, unspecified: Secondary | ICD-10-CM | POA: Diagnosis not present

## 2019-03-18 DIAGNOSIS — N186 End stage renal disease: Secondary | ICD-10-CM | POA: Diagnosis not present

## 2019-03-18 DIAGNOSIS — N2581 Secondary hyperparathyroidism of renal origin: Secondary | ICD-10-CM | POA: Diagnosis not present

## 2019-03-18 DIAGNOSIS — E1129 Type 2 diabetes mellitus with other diabetic kidney complication: Secondary | ICD-10-CM | POA: Diagnosis not present

## 2019-03-18 DIAGNOSIS — D631 Anemia in chronic kidney disease: Secondary | ICD-10-CM | POA: Diagnosis not present

## 2019-03-19 ENCOUNTER — Other Ambulatory Visit: Payer: Self-pay

## 2019-03-19 ENCOUNTER — Encounter: Payer: Self-pay | Admitting: Endocrinology

## 2019-03-19 ENCOUNTER — Ambulatory Visit (INDEPENDENT_AMBULATORY_CARE_PROVIDER_SITE_OTHER): Payer: Medicare Other | Admitting: Endocrinology

## 2019-03-19 VITALS — BP 132/60 | HR 74 | Temp 97.6°F | Ht 74.0 in | Wt 156.0 lb

## 2019-03-19 DIAGNOSIS — E1165 Type 2 diabetes mellitus with hyperglycemia: Secondary | ICD-10-CM | POA: Diagnosis not present

## 2019-03-19 DIAGNOSIS — M8000XD Age-related osteoporosis with current pathological fracture, unspecified site, subsequent encounter for fracture with routine healing: Secondary | ICD-10-CM

## 2019-03-19 MED ORDER — DENOSUMAB 60 MG/ML ~~LOC~~ SOSY
60.0000 mg | PREFILLED_SYRINGE | Freq: Once | SUBCUTANEOUS | Status: AC
  Administered 2019-03-19: 60 mg via SUBCUTANEOUS

## 2019-03-19 NOTE — Patient Instructions (Signed)
5 Units in am

## 2019-03-19 NOTE — Progress Notes (Signed)
Per orders of Dr. Dwyane Dee injection of Prolia given today by L. Khristin Keleher CMA . Patient tolerated injection well.

## 2019-03-19 NOTE — Progress Notes (Signed)
Patient ID: [name], male   DOB: July 22, 1941, 78 y.o.   MRN: [id number]           Reason for Appointment:  Follow-up for endocrinology problems   History of Present Illness:          Date of diagnosis of type 2 diabetes mellitus:  1996      Background history:  He has had long-standing diabetes probably treated with metformin initially and subsequently with sulfonylurea drugs At some point he was also given Actos in addition to his glipizide which he is still taking Records of his control are only available for the last 4 years not on Over the last few years his A1c has been generally in the 6-7% range His A1c was 6.2 in May 2016 and apparently was starting to get low normal blood sugars at times He was admitted to the hospital for fluid overload and renal failure and at that time his Actos was stopped Subsequently his blood sugars had been markedly increased He was switched from regular insulin to NovoLog on his initial consultation  Recent history:    INSULIN regimen: NovoLog 4 units before breakfast and lunch and 4-6 units before dinner Oral hypoglycemic drugs: Tradjenta 5 mg daily  His A1c was about the same at 5.3 in April, previously 5.4   Fructosamine in April is better at 292 compared to 344 previously  Current blood sugar patterns and problems identified:  He has been told to resume his Tradjenta after his last visit in April because of blood sugar being variable  Again his fasting blood sugars are normal without any basal insulin  HIGHEST blood sugars are after breakfast and periodically in the afternoons especially when he is back from dialysis  His wife is adjusting his insulin somewhat but mostly based on his pre-meal blood sugar  Even though he is eating a relatively high fat meal with a biscuit in the morning he is only taking 4 units NovoLog and this may be causing higher blood sugars midday.  Also he is drinking a protein drink in the morning with  unknown carbohydrate content  Blood sugars are more evenly controlled after eating meals and no hypoglycemia compared to last visit  He is only eating some crackers at dialysis and not a meal but not clear why sugars are higher when he comes back in the afternoon  He appears to have gained back some weight since his last visit   Dialysis is done at 12 pm, doing it on Monday/Wednesday/Fridays     Side effects from medications have been: None  Compliance with the medical regimen: Fair  Glucose monitoring:  done 3  times a day         Glucometer:  Freestyle  Blood Glucose readings by review of monitor: Download results as below  Currently 62% of the readings are between 70 and 180  PRE-MEAL Fasting Lunch Dinner Bedtime Overall  Glucose range:  87-117   129-269    Mean/median:  100   191  150 +/-48   POST-MEAL PC Breakfast PC Lunch PC Dinner  Glucose range:  192, 224  140-218  71-194  Mean/median:  168  187  158   Previous readings:  PRE-MEAL Fasting Lunch Dinner Bedtime Overall  Glucose range:  89-155  107-286  55-256  126, 234   Mean/median:  110  170  150  146   POST-MEAL PC Breakfast PC Lunch PC Dinner  Glucose range:  125, 192  Mean/median:          Self-care: The diet that the patient has been following is: tries to limit sweets, portions. eating out only about once a week at a steak house        Typical meal intake: Breakfast is variable; supper 7-8 pm            Dietician visit, most recent: 08/2017               Exercise:  Not able to do any  Weight history:   Wt Readings from Last 3 Encounters:  03/19/19 156 lb (70.8 kg)  03/11/19 156 lb 1.4 oz (70.8 kg)  01/20/19 156 lb (70.8 kg)    Glycemic control:    Lab Results  Component Value Date   HGBA1C 5.3 01/15/2019   HGBA1C 5.4 09/09/2018   HGBA1C 5.2 06/01/2018   Lab Results  Component Value Date   MICROALBUR 37.2 09/03/2016   LDLCALC 21 09/05/2017   CREATININE 4.03 (H) 11/21/2018    Lab  Results  Component Value Date   FRUCTOSAMINE 292 (H) 01/13/2019   FRUCTOSAMINE 344 (H) 11/13/2018   FRUCTOSAMINE 295 (H) 09/09/2018    PROBLEM 2 Osteoporosis: See review of systems     Allergies as of 03/19/2019      Reactions   Ambien [zolpidem Tartrate] Other (See Comments)   hallucinations and "felt crazy"    Penicillins Rash   Has patient had a PCN reaction causing immediate rash, facial/tongue/throat swelling, SOB or lightheadedness with hypotension: Yes Has patient had a PCN reaction causing severe rash involving mucus membranes or skin necrosis: Yes Has patient had a PCN reaction that required hospitalization: No Has patient had a PCN reaction occurring within the last 10 years: No If all of the above answers are "NO", then may proceed with Cephalosporin use.      Medication List       Accurate as of March 19, 2019 10:02 PM. If you have any questions, ask your nurse or doctor.        STOP taking these medications   memantine 10 MG tablet Commonly known as: NAMENDA Stopped by: Elayne Snare, MD     TAKE these medications   acetaminophen 325 MG tablet Commonly known as: TYLENOL Take 1-2 tablets (325-650 mg total) by mouth every 4 (four) hours as needed for mild pain.   allopurinol 100 MG tablet Commonly known as: ZYLOPRIM Take 100 mg by mouth daily.   Carboxymethylcellulose Sod PF 0.25 % Soln Place 1 drop into both eyes as needed (dry eyes).   clobetasol 0.05 % external solution Commonly known as: TEMOVATE Apply 1 application topically 2 (two) times daily as needed (irritation). Apply to scalp   denosumab 60 MG/ML Sosy injection Commonly known as: PROLIA Inject 60 mg into the skin every 6 (six) months.   docusate sodium 100 MG capsule Commonly known as: COLACE Take 1 capsule (100 mg total) by mouth daily. What changed:   when to take this  reasons to take this   donepezil 10 MG tablet Commonly known as: ARICEPT Take 10 mg by mouth 2 (two) times a  day.   feeding supplement (NEPRO CARB STEADY) Liqd Take 237 mLs by mouth daily. Take 237 mg - 474 ml per day   Fluocinolone Acetonide Scalp 0.01 % Oil Apply 1 application topically at bedtime. Apply to scalp   fluticasone 0.05 % cream Commonly known as: CUTIVATE Apply 1 application topically 2 (two) times daily as needed (  irritation). Apply to face   fluticasone 50 MCG/ACT nasal spray Commonly known as: FLONASE Place 1 spray into both nostrils daily. What changed:   when to take this  reasons to take this   FreeStyle Freedom Lite w/Device Kit Use to check blood sugar 2 times per day dx code E11.65   freestyle lancets Use as instructed to check blood sugar 2 times per day dx code E11.65   glucose blood test strip Commonly known as: FREESTYLE LITE Use as instructed to check blood sugar 3 times daily.   HYDROcodone-acetaminophen 5-325 MG tablet Commonly known as: NORCO/VICODIN Take 1 tablet by mouth every 4 (four) hours as needed.   hydrocortisone 2.5 % cream Apply 1 application topically as needed for after feeds. Legs and arms   hydrOXYzine 25 MG capsule Commonly known as: VISTARIL Take 1 capsule by mouth every 6 (six) hours as needed for itching.   insulin aspart 100 UNIT/ML FlexPen Commonly known as: NovoLOG FlexPen INJECT 6-8 UNITS UNDER THE SKIN THREE TIMES DAILY BEFORE MEALS. DX:E11.65 What changed:   how much to take  how to take this  when to take this  additional instructions   ketoconazole 2 % shampoo Commonly known as: Nizoral Apply 1 application topically 2 (two) times a week. What changed:   when to take this  reasons to take this   levothyroxine 50 MCG tablet Commonly known as: SYNTHROID Take 1 tablet (50 mcg total) by mouth daily.   Men-Phor lotion Generic drug: camphor-menthol Apply 1 application topically daily.   midodrine 10 MG tablet Commonly known as: PROAMATINE Take one pill prior to hemodialysis on MWF What changed:    how much to take  how to take this  when to take this  additional instructions   Naftifine HCl 2 % Crea Commonly known as: Naftin Apply to feet daily for fungal infection What changed:   how much to take  how to take this  when to take this  reasons to take this  additional instructions   omeprazole 40 MG capsule Commonly known as: PRILOSEC Take 1 capsule (40 mg total) by mouth daily.   Pen Needles 32G X 4 MM Misc 1 each by Does not apply route 3 (three) times daily. USE PEN NEEDLES TO INJECT INSULIN THREE TIMES DAILY.   polyethylene glycol 17 g packet Commonly known as: MIRALAX / GLYCOLAX Take 17 g by mouth daily. What changed:   when to take this  reasons to take this   sevelamer carbonate 800 MG tablet Commonly known as: RENVELA Take 1,600 mg by mouth 3 (three) times daily before meals.   simvastatin 20 MG tablet Commonly known as: ZOCOR Take 20 mg by mouth at bedtime.   Tradjenta 5 MG Tabs tablet Generic drug: linagliptin Take 5 mg by mouth daily.       Allergies:  Allergies  Allergen Reactions  . Ambien [Zolpidem Tartrate] Other (See Comments)    hallucinations and "felt crazy"   . Penicillins Rash    Has patient had a PCN reaction causing immediate rash, facial/tongue/throat swelling, SOB or lightheadedness with hypotension: Yes Has patient had a PCN reaction causing severe rash involving mucus membranes or skin necrosis: Yes Has patient had a PCN reaction that required hospitalization: No Has patient had a PCN reaction occurring within the last 10 years: No If all of the above answers are "NO", then may proceed with Cephalosporin use.     Past Medical History:  Diagnosis Date  . Allergy   .  Anemia   . Arthritis   . Cataract    bil cateracts removed  . Coronary artery disease   . Dementia arising in the senium and presenium ([place])   . Diabetes mellitus    Type 2  . Diverticulitis   . ED (erectile dysfunction)   . Elevated  homocysteine ([place])   . ESRD (end stage renal disease) on dialysis ([place]) 03/2015  . GERD (gastroesophageal reflux disease)    pepto   . Gout   . Hyperlipidemia   . Hypertension   . Hypothyroidism   . Pneumonia   . PVD (peripheral vascular disease) ([place])    has plastic aorta  . Seasonal allergies   . Sleep apnea    does not wear c-pap    Past Surgical History:  Procedure Laterality Date  . aortobifemoral bypass    . AV FISTULA PLACEMENT Left 12/01/2013   Procedure: ARTERIOVENOUS (AV) FISTULA CREATION- LEFT BRACHIOCEPHALIC;  Surgeon: Angelia Mould, MD;  Location: [facility];  Service: Vascular;  Laterality: Left;  . [place] TRANSPOSITION Right 07/27/2014   Procedure: BASCILIC VEIN TRANSPOSITION;  Surgeon: Angelia Mould, MD;  Location: [facility];  Service: Vascular;  Laterality: Right;  . BRAIN SURGERY  07/22/2018  . BREAST SURGERY     left - granulomatous mastitis  . BURR HOLE Bilateral 07/22/2018   Procedure: BILATERAL BURR HOLES;  Surgeon: Kristeen Miss, MD;  Location: [facility];  Service: Neurosurgery;  Laterality: Bilateral;  . COLONOSCOPY    . 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 ENTEROSCOPY N/A 06/03/2018   Procedure: ENTEROSCOPY;  Surgeon: Lavena Bullion, DO;  Location: [place];  Service: Gastroenterology;  Laterality: N/A;  . ENTEROSCOPY N/A 11/20/2018   Procedure: ENTEROSCOPY;  Surgeon: Rush Landmark Telford Nab., MD;  Location[facility] END[facility]COPY;  Service: Gastroenterology;  Laterality: N/A;  . EYE SURGERY Bilateral    cataracts  . HEMODIALYSIS INPATIENT  01/17/2018      . HOT HEMOSTASIS N/A 06/03/2018   Procedure: HOT HEMOSTASIS (ARGON PLASMA COAGULATION/BICAP);  Surgeon: Lavena Bullion, DO;  Location[facility] ENDOSCOPY;  Service: Gastroenterology;  Laterality: N/A;  . HOT HEMOSTASIS N/A 11/20/2018   Procedure: HOT HEMOSTASIS (ARGON PLASMA COAGULATION/BICAP);  Surgeon: Irving Copas., MD;  Location: [place];  Service: Gastroenterology;   Laterality: N/A;  . REVISON OF ARTERIOVENOUS FISTULA Left 02/09/2014   Procedure: REVISON OF LEFT ARTERIOVENOUS FISTULA - RESECTION OF RENDUNDANT VEIN;  Surgeon: Angelia Mould, MD;  Location: [place];  Service: Vascular;  Laterality: Left;  . SBO with lysis adhesions    . SHUNTOGRAM Left 04/19/2014   Procedure: FISTULOGRAM;  Surgeon: Angelia Mould, MD;  Location[facility] CATH LAB;  Service: Cardiovascular;  Laterality: Left;  . UNILATERAL UPPER EXTREMEITY ANGIOGRAM N/A 07/12/2014   Procedure: UNILATERAL UPPER Anselmo Rod;  Surgeon: Angelia Mould, MD;  Location: [facility] CATH LAB;  Service: Cardiovascular;  Laterality: N/A;    Family History  Problem Relation Age of Onset  . Aneurysm Mother   . Heart disease Father   . Stroke Father   . Hypertension Father   . Diabetes Father   . Dementia Neg Hx   . Colon cancer Neg Hx   . Esophageal cancer Neg Hx   . Pancreatic cancer Neg Hx   . Prostate cancer Neg Hx   . Rectal cancer Neg Hx   . Stomach cancer Neg Hx     Social History:  reports that he quit smoking about 24  years ago. He has never used smokeless tobacco. He reports that he does not drink alcohol or use drugs.    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 he is not a candidate for Forteo his nephrologist suggested Prolia He has had his first injection of Prolia in 5/19 and repeat in 12/19 He had rib fractures after his fall in October However after his recent fall he did not have any fractures documented and had bruising of the ribs  Calcium corrected for his albumin is normal Is taking any Sensipar currently  Lab Results  Component Value Date   PTH 154 (H) 10/06/2014   CALCIUM 7.2 (L) 11/21/2018   CAION 1.33 07/21/2018   PHOS 2.6 11/21/2018     Lipid  history: He has been treated with Simvastatin 20 mg  by PCP, results as follows    Lab Results  Component Value Date   CHOL 79 09/05/2017   HDL 38.00 (L) 09/05/2017   LDLCALC 21 09/05/2017   TRIG 99.0 09/05/2017   CHOLHDL 2 09/05/2017          He has ESRD on dialysis    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  Is on 50 mcg levothyroxine with normal TSH levels  Lab Results  Component Value Date   TSH 1.77 11/13/2018       Physical Examination:  BP 132/60 (BP Location: Left Arm, Patient Position: Sitting, Cuff Size: Normal)   Pulse 74   Temp 97.6 F (36.4 C) (Oral)   Ht 6' 2"  (1.88 m)   Wt 156 lb (70.8 kg)   SpO2 98%   BMI 20.03 kg/m       ASSESSMENT:  Diabetes type 2  See history of present illness for discussion of his current management, blood sugar patterns and problems identified  He is basically only on mealtime insulin and recently Tradjenta added back to  His fructosamine previously was improving and 292  His wife is helping him with his injections He does again have a variable schedule for meals because of dialysis midday on 3 days a week Despite having excellent fasting blood sugars postprandial readings are frequently high but not as frequent recently He does have a relatively large breakfast along with Nepro drink and may have readings over 200 postprandially which are not being monitored consistently Also tends to have higher readings in the afternoons even without much intake especially when he is on dialysis Reviewed his blood sugar monitor download and current management Discussed with him and his wife that his pre-meal injection should be adjusted based on what he is planning to eat and current patterns     OSTEOPOROSIS with fracture: He is due for Prolia today and this will be given We will continue to monitor calcium periodically although this has been checked regularly at  dialysis  No further fracture occurrence despite his tendency to fall    PLAN:  To continue NovoLog and Tradjenta as above Discussed adjusting the mealtime insulin based on meal size and carbohydrate intake Likely needs 5 units NovoLog at breakfast even with normal pre-meal blood sugar and this will be adjusted based on blood sugars 2 hours after eating, possibly needs 6 units Discussed taking the NovoLog  up to 10 minutes before eating each meal If his blood sugars are also high after late afternoon meal he may need to go up on the insulin accordingly May have a snack at bedtime Avoid high fat foods at least at lunch and dinner Recheck A1c and fructosamine on the next visit  Prolia injection will be given today  Patient Instructions  5 Units in am   Total visit time for evaluation and management of multiple problems and counseling =25 minutes     Elayne Snare 03/19/2019, 10:02 PM   Note: This office note was prepared with Dragon voice recognition system technology. Any transcriptional errors that result from this process are unintentional.

## 2019-03-20 DIAGNOSIS — D631 Anemia in chronic kidney disease: Secondary | ICD-10-CM | POA: Diagnosis not present

## 2019-03-20 DIAGNOSIS — N186 End stage renal disease: Secondary | ICD-10-CM | POA: Diagnosis not present

## 2019-03-20 DIAGNOSIS — E119 Type 2 diabetes mellitus without complications: Secondary | ICD-10-CM | POA: Diagnosis not present

## 2019-03-20 DIAGNOSIS — N2581 Secondary hyperparathyroidism of renal origin: Secondary | ICD-10-CM | POA: Diagnosis not present

## 2019-03-20 DIAGNOSIS — D509 Iron deficiency anemia, unspecified: Secondary | ICD-10-CM | POA: Diagnosis not present

## 2019-03-20 DIAGNOSIS — E1129 Type 2 diabetes mellitus with other diabetic kidney complication: Secondary | ICD-10-CM | POA: Diagnosis not present

## 2019-03-23 DIAGNOSIS — N186 End stage renal disease: Secondary | ICD-10-CM | POA: Diagnosis not present

## 2019-03-23 DIAGNOSIS — D631 Anemia in chronic kidney disease: Secondary | ICD-10-CM | POA: Diagnosis not present

## 2019-03-23 DIAGNOSIS — D509 Iron deficiency anemia, unspecified: Secondary | ICD-10-CM | POA: Diagnosis not present

## 2019-03-23 DIAGNOSIS — E1129 Type 2 diabetes mellitus with other diabetic kidney complication: Secondary | ICD-10-CM | POA: Diagnosis not present

## 2019-03-23 DIAGNOSIS — E119 Type 2 diabetes mellitus without complications: Secondary | ICD-10-CM | POA: Diagnosis not present

## 2019-03-23 DIAGNOSIS — N2581 Secondary hyperparathyroidism of renal origin: Secondary | ICD-10-CM | POA: Diagnosis not present

## 2019-03-25 ENCOUNTER — Other Ambulatory Visit: Payer: Medicare Other | Admitting: Internal Medicine

## 2019-03-25 DIAGNOSIS — E119 Type 2 diabetes mellitus without complications: Secondary | ICD-10-CM | POA: Diagnosis not present

## 2019-03-25 DIAGNOSIS — D509 Iron deficiency anemia, unspecified: Secondary | ICD-10-CM | POA: Diagnosis not present

## 2019-03-25 DIAGNOSIS — D631 Anemia in chronic kidney disease: Secondary | ICD-10-CM | POA: Diagnosis not present

## 2019-03-25 DIAGNOSIS — N186 End stage renal disease: Secondary | ICD-10-CM | POA: Diagnosis not present

## 2019-03-25 DIAGNOSIS — E1129 Type 2 diabetes mellitus with other diabetic kidney complication: Secondary | ICD-10-CM | POA: Diagnosis not present

## 2019-03-25 DIAGNOSIS — Z992 Dependence on renal dialysis: Secondary | ICD-10-CM | POA: Diagnosis not present

## 2019-03-25 DIAGNOSIS — N2581 Secondary hyperparathyroidism of renal origin: Secondary | ICD-10-CM | POA: Diagnosis not present

## 2019-03-25 DIAGNOSIS — E1122 Type 2 diabetes mellitus with diabetic chronic kidney disease: Secondary | ICD-10-CM | POA: Diagnosis not present

## 2019-03-26 ENCOUNTER — Other Ambulatory Visit: Payer: Medicare Other | Admitting: Internal Medicine

## 2019-03-27 DIAGNOSIS — N2581 Secondary hyperparathyroidism of renal origin: Secondary | ICD-10-CM | POA: Diagnosis not present

## 2019-03-27 DIAGNOSIS — N186 End stage renal disease: Secondary | ICD-10-CM | POA: Diagnosis not present

## 2019-03-27 DIAGNOSIS — D509 Iron deficiency anemia, unspecified: Secondary | ICD-10-CM | POA: Diagnosis not present

## 2019-03-27 DIAGNOSIS — E119 Type 2 diabetes mellitus without complications: Secondary | ICD-10-CM | POA: Diagnosis not present

## 2019-03-27 DIAGNOSIS — E1129 Type 2 diabetes mellitus with other diabetic kidney complication: Secondary | ICD-10-CM | POA: Diagnosis not present

## 2019-03-30 DIAGNOSIS — N2581 Secondary hyperparathyroidism of renal origin: Secondary | ICD-10-CM | POA: Diagnosis not present

## 2019-03-30 DIAGNOSIS — N186 End stage renal disease: Secondary | ICD-10-CM | POA: Diagnosis not present

## 2019-03-30 DIAGNOSIS — E119 Type 2 diabetes mellitus without complications: Secondary | ICD-10-CM | POA: Diagnosis not present

## 2019-03-30 DIAGNOSIS — D509 Iron deficiency anemia, unspecified: Secondary | ICD-10-CM | POA: Diagnosis not present

## 2019-03-30 DIAGNOSIS — E1129 Type 2 diabetes mellitus with other diabetic kidney complication: Secondary | ICD-10-CM | POA: Diagnosis not present

## 2019-03-31 ENCOUNTER — Encounter: Payer: Medicare Other | Admitting: Internal Medicine

## 2019-04-01 DIAGNOSIS — E1129 Type 2 diabetes mellitus with other diabetic kidney complication: Secondary | ICD-10-CM | POA: Diagnosis not present

## 2019-04-01 DIAGNOSIS — D509 Iron deficiency anemia, unspecified: Secondary | ICD-10-CM | POA: Diagnosis not present

## 2019-04-01 DIAGNOSIS — N2581 Secondary hyperparathyroidism of renal origin: Secondary | ICD-10-CM | POA: Diagnosis not present

## 2019-04-01 DIAGNOSIS — N186 End stage renal disease: Secondary | ICD-10-CM | POA: Diagnosis not present

## 2019-04-01 DIAGNOSIS — E119 Type 2 diabetes mellitus without complications: Secondary | ICD-10-CM | POA: Diagnosis not present

## 2019-04-02 ENCOUNTER — Other Ambulatory Visit: Payer: Self-pay

## 2019-04-02 ENCOUNTER — Other Ambulatory Visit: Payer: Medicare Other | Admitting: Internal Medicine

## 2019-04-02 DIAGNOSIS — IMO0002 Reserved for concepts with insufficient information to code with codable children: Secondary | ICD-10-CM

## 2019-04-02 DIAGNOSIS — N186 End stage renal disease: Secondary | ICD-10-CM

## 2019-04-02 DIAGNOSIS — Z0000 Encounter for general adult medical examination without abnormal findings: Secondary | ICD-10-CM

## 2019-04-02 DIAGNOSIS — E1165 Type 2 diabetes mellitus with hyperglycemia: Secondary | ICD-10-CM | POA: Diagnosis not present

## 2019-04-02 DIAGNOSIS — M8000XD Age-related osteoporosis with current pathological fracture, unspecified site, subsequent encounter for fracture with routine healing: Secondary | ICD-10-CM | POA: Diagnosis not present

## 2019-04-02 DIAGNOSIS — I10 Essential (primary) hypertension: Secondary | ICD-10-CM

## 2019-04-02 DIAGNOSIS — R413 Other amnesia: Secondary | ICD-10-CM

## 2019-04-02 DIAGNOSIS — K5520 Angiodysplasia of colon without hemorrhage: Secondary | ICD-10-CM | POA: Diagnosis not present

## 2019-04-02 DIAGNOSIS — Z992 Dependence on renal dialysis: Secondary | ICD-10-CM | POA: Diagnosis not present

## 2019-04-02 DIAGNOSIS — D500 Iron deficiency anemia secondary to blood loss (chronic): Secondary | ICD-10-CM | POA: Diagnosis not present

## 2019-04-03 ENCOUNTER — Telehealth: Payer: Self-pay | Admitting: Gastroenterology

## 2019-04-03 DIAGNOSIS — E119 Type 2 diabetes mellitus without complications: Secondary | ICD-10-CM | POA: Diagnosis not present

## 2019-04-03 DIAGNOSIS — N186 End stage renal disease: Secondary | ICD-10-CM | POA: Diagnosis not present

## 2019-04-03 DIAGNOSIS — E1129 Type 2 diabetes mellitus with other diabetic kidney complication: Secondary | ICD-10-CM | POA: Diagnosis not present

## 2019-04-03 DIAGNOSIS — D509 Iron deficiency anemia, unspecified: Secondary | ICD-10-CM | POA: Diagnosis not present

## 2019-04-03 DIAGNOSIS — N2581 Secondary hyperparathyroidism of renal origin: Secondary | ICD-10-CM | POA: Diagnosis not present

## 2019-04-03 NOTE — Telephone Encounter (Signed)
Please call the patient with the following results from his recent Video Capsule Endoscopy.  VCE completed and notable for the following:  Findings: - Complete study, with adequate prep -First duodenal image at 15 minutes 49 seconds - A few tiny red spots noted in the mid small bowel at 2 hours 30 minutes, without active bleeding.  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  If rebleeding, would consider referral for retrograde double-balloon enteroscopy.  Otherwise observation with medical management as above -Can follow-up with Dr. Bryan Lemma in the GI clinic as previously scheduled

## 2019-04-06 ENCOUNTER — Encounter: Payer: Medicare Other | Admitting: Internal Medicine

## 2019-04-06 DIAGNOSIS — N186 End stage renal disease: Secondary | ICD-10-CM | POA: Diagnosis not present

## 2019-04-06 DIAGNOSIS — E1129 Type 2 diabetes mellitus with other diabetic kidney complication: Secondary | ICD-10-CM | POA: Diagnosis not present

## 2019-04-06 DIAGNOSIS — N2581 Secondary hyperparathyroidism of renal origin: Secondary | ICD-10-CM | POA: Diagnosis not present

## 2019-04-06 DIAGNOSIS — E119 Type 2 diabetes mellitus without complications: Secondary | ICD-10-CM | POA: Diagnosis not present

## 2019-04-06 DIAGNOSIS — D509 Iron deficiency anemia, unspecified: Secondary | ICD-10-CM | POA: Diagnosis not present

## 2019-04-06 LAB — COMPLETE METABOLIC PANEL WITH GFR
AG Ratio: 1.4 (calc) (ref 1.0–2.5)
ALT: 6 U/L — ABNORMAL LOW (ref 9–46)
AST: 19 U/L (ref 10–35)
Albumin: 3.8 g/dL (ref 3.6–5.1)
Alkaline phosphatase (APISO): 87 U/L (ref 35–144)
BUN/Creatinine Ratio: 5 (calc) — ABNORMAL LOW (ref 6–22)
BUN: 31 mg/dL — ABNORMAL HIGH (ref 7–25)
CO2: 33 mmol/L — ABNORMAL HIGH (ref 20–32)
Calcium: 6.4 mg/dL — ABNORMAL LOW (ref 8.6–10.3)
Chloride: 95 mmol/L — ABNORMAL LOW (ref 98–110)
Creat: 6.01 mg/dL — ABNORMAL HIGH (ref 0.70–1.18)
GFR, Est African American: 10 mL/min/{1.73_m2} — ABNORMAL LOW (ref >=60)
GFR, Est Non African American: 8 mL/min/{1.73_m2} — ABNORMAL LOW (ref >=60)
Globulin: 2.8 g/dL (calc) (ref 1.9–3.7)
Glucose, Bld: 103 mg/dL — ABNORMAL HIGH (ref 65–99)
Potassium: 4.4 mmol/L (ref 3.5–5.3)
Sodium: 144 mmol/L (ref 135–146)
Total Bilirubin: 0.4 mg/dL (ref 0.2–1.2)
Total Protein: 6.6 g/dL (ref 6.1–8.1)

## 2019-04-06 LAB — CBC WITH DIFFERENTIAL/PLATELET
Absolute Monocytes: 805 cells/uL (ref 200–950)
Basophils Absolute: 66 cells/uL (ref 0–200)
Basophils Relative: 0.8 %
Eosinophils Absolute: 241 cells/uL (ref 15–500)
Eosinophils Relative: 2.9 %
HCT: 34.7 % — ABNORMAL LOW (ref 38.5–50.0)
Hemoglobin: 11 g/dL — ABNORMAL LOW (ref 13.2–17.1)
Lymphs Abs: 1826 cells/uL (ref 850–3900)
MCH: 31 pg (ref 27.0–33.0)
MCHC: 31.7 g/dL — ABNORMAL LOW (ref 32.0–36.0)
MCV: 97.7 fL (ref 80.0–100.0)
MPV: 10.8 fL (ref 7.5–12.5)
Monocytes Relative: 9.7 %
Neutro Abs: 5362 cells/uL (ref 1500–7800)
Neutrophils Relative %: 64.6 %
Platelets: 259 10*3/uL (ref 140–400)
RBC: 3.55 10*6/uL — ABNORMAL LOW (ref 4.20–5.80)
RDW: 15.7 % — ABNORMAL HIGH (ref 11.0–15.0)
Total Lymphocyte: 22 %
WBC: 8.3 10*3/uL (ref 3.8–10.8)

## 2019-04-06 LAB — PSA: PSA: 0.2 ng/mL (ref <=4.0)

## 2019-04-06 LAB — LIPID PANEL
Cholesterol: 104 mg/dL (ref <200)
HDL: 40 mg/dL (ref >=40)
LDL Cholesterol (Calc): 44 mg/dL (calc)
Non-HDL Cholesterol (Calc): 64 mg/dL (calc) (ref <130)
Total CHOL/HDL Ratio: 2.6 (calc) (ref <5.0)
Triglycerides: 113 mg/dL (ref <150)

## 2019-04-06 LAB — HEMOGLOBIN A1C
Hgb A1c MFr Bld: 5.5 % of total Hgb (ref <5.7)
Mean Plasma Glucose: 111 (calc)
eAG (mmol/L): 6.2 (calc)

## 2019-04-06 LAB — MICROALBUMIN / CREATININE URINE RATIO

## 2019-04-06 NOTE — Telephone Encounter (Signed)
Called and spoke with patient-patient informed of result note and MD recommendations; patient is agreeable with plan of care; Patient verbalized understanding of information/instructions;  Patient was advised to call the office at [phone] if questions/concerns arise;  Dr. Bryan Lemma, The patient reports he is not currently on any iron supplements (OTC or RX) but does receive IV iron (randomly) when he has dialysis;  Do you want to prescribe/OTC any iron supplementation for this patient? Please advise

## 2019-04-07 ENCOUNTER — Ambulatory Visit (INDEPENDENT_AMBULATORY_CARE_PROVIDER_SITE_OTHER): Payer: Medicare Other | Admitting: Internal Medicine

## 2019-04-07 ENCOUNTER — Encounter: Payer: Self-pay | Admitting: Internal Medicine

## 2019-04-07 ENCOUNTER — Other Ambulatory Visit: Payer: Self-pay

## 2019-04-07 VITALS — BP 100/50 | HR 69 | Temp 98.9°F | Ht 67.0 in | Wt 153.0 lb

## 2019-04-07 DIAGNOSIS — Z9889 Other specified postprocedural states: Secondary | ICD-10-CM

## 2019-04-07 DIAGNOSIS — Z9181 History of falling: Secondary | ICD-10-CM

## 2019-04-07 DIAGNOSIS — K31819 Angiodysplasia of stomach and duodenum without bleeding: Secondary | ICD-10-CM | POA: Diagnosis not present

## 2019-04-07 DIAGNOSIS — IMO0001 Reserved for inherently not codable concepts without codable children: Secondary | ICD-10-CM

## 2019-04-07 DIAGNOSIS — Z8739 Personal history of other diseases of the musculoskeletal system and connective tissue: Secondary | ICD-10-CM

## 2019-04-07 DIAGNOSIS — Z794 Long term (current) use of insulin: Secondary | ICD-10-CM

## 2019-04-07 DIAGNOSIS — K5520 Angiodysplasia of colon without hemorrhage: Secondary | ICD-10-CM

## 2019-04-07 DIAGNOSIS — Z87820 Personal history of traumatic brain injury: Secondary | ICD-10-CM | POA: Diagnosis not present

## 2019-04-07 DIAGNOSIS — D369 Benign neoplasm, unspecified site: Secondary | ICD-10-CM

## 2019-04-07 DIAGNOSIS — N186 End stage renal disease: Secondary | ICD-10-CM | POA: Diagnosis not present

## 2019-04-07 DIAGNOSIS — R413 Other amnesia: Secondary | ICD-10-CM | POA: Diagnosis not present

## 2019-04-07 DIAGNOSIS — E039 Hypothyroidism, unspecified: Secondary | ICD-10-CM

## 2019-04-07 DIAGNOSIS — E119 Type 2 diabetes mellitus without complications: Secondary | ICD-10-CM

## 2019-04-07 DIAGNOSIS — Z0000 Encounter for general adult medical examination without abnormal findings: Secondary | ICD-10-CM

## 2019-04-07 DIAGNOSIS — E538 Deficiency of other specified B group vitamins: Secondary | ICD-10-CM

## 2019-04-07 DIAGNOSIS — Z992 Dependence on renal dialysis: Secondary | ICD-10-CM

## 2019-04-07 DIAGNOSIS — I10 Essential (primary) hypertension: Secondary | ICD-10-CM

## 2019-04-07 NOTE — Progress Notes (Signed)
Subjective:    Patient ID: [name], male    DOB: [birth year], 78 y.o.   MRN: [id number]  HPI 78 year old Male was seen in April for annual Medicare wellness exam via virtual visit.  He is now here for health maintenance exam and evaluation of medical issues.  In February he was admitted with a GI bleed.  He had an extensive evaluation globin is stable at 11 g.  In February it dropped to 7.6 g.  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  He had endoscopy and colonoscopy 2019 showing a tubular adenoma and a hyperplastic polyp.  He has a history of insulin-dependent diabetes mellitus and hemoglobin A1c is excellent at 5.5%.  He is followed by Dr. Dwyane Dee.  He has end-stage kidney disease and is on dialysis.  He has mild memory loss which is stable on Aricept  He takes Prolia every 6 months for osteoporosis per Dr. Dwyane Dee.    He has chronic dermatitis treated with Temovate.  He takes Vistaril for chronic itching.  History of hypothyroidism treated with thyroid replacement medication.  Followed closely by Dr. 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  Had bilateral Burr holes placed by Dr. Ellene Route.  Went to rehab for a period of time.  He also had fractured ribs.  This was related to fall.  Issue with GI bleeding early October 2019.  History of kyphoplasty L3 and L5 in the remote past secondary to fall.  Had endplate disruption at L 1 and L2 without frank compression and April 2019 due to a fall.  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  History of gout, hyperlipidemia, hypothyroidism and allergic rhinitis.  History of peripheral vascular disease.  A Cardiolite study done by Dr. Pernell Dupre in 2003 that was  negative.  History of Herpes zoster 2007.  History of B12 deficiency and is supposed to get 12 injections monthly.  History of small bowel obstruction treated with surgical lysis of adhesions in 1996.  Sometimes gets diverticulitis that responds to Cipro and Flagyl.  Social history: He is retired from [employer].  He used to work part-time at the [employer].  He is married.  Wife works for the [employer].  One adopted daughter in her 46s.  Non-smoker.  Social alcohol consumption.  Family history: Father died of congestive heart failure at age 56.  Mother died at age 49 of a brain aneurysm.  He is an only child.    Review of Systems no new complaints     Objective:   Physical Exam BP 100/50, weight 153 pounds, pulse 69 regular pulse oximetry 99% afebrile  Skin warm and dry.  Neck supple.  Chest clear.  Cardiac exam regular rate and rhythm normal S1 and S2.  Abdomen no masses appreciated.  No lower extremity edema.  He has alert and can carry on a good conversation.  Memory not tested.       Assessment & Plan:  Insulin-dependent diabetes mellitus under excellent control by Dr. Dwyane Dee  Chronic kidney disease on dialysis end-stage followed by Dr. Joelyn Oms  History of traumatic brain injury status post bur hole placement and rehab hospitalization this past year secondary to a fall  History of lumbar compression fracture  secondary to a fall  Essential hypertension-stable  Secondary hyperparathyroidism  Hyperlipidemia treated with Zocor  B12 deficiency  Memory loss treated with Aricept and B12 injections  History of fractures now on Prolia per Dr. Dwyane Dee  Plan: Continue current treatment and follow-up in 6 months.  He looks well and has made an extremely remarkable recovery after his head injury.

## 2019-04-07 NOTE — Telephone Encounter (Signed)
Called and spoke with patient's wife-[name]-verified DPR--[name] informed of MD recommendations; [name] is agreeable with plan of care; [name] verbalized understanding of information/instructions;  [name] was advised to call the office at [phone] if questions/concerns arise;

## 2019-04-07 NOTE — Telephone Encounter (Signed)
No, the IV iron that he receives at HD is correct. Can continue to monitor with serial lab checks as outlined. Thanks.

## 2019-04-08 DIAGNOSIS — E1129 Type 2 diabetes mellitus with other diabetic kidney complication: Secondary | ICD-10-CM | POA: Diagnosis not present

## 2019-04-08 DIAGNOSIS — N186 End stage renal disease: Secondary | ICD-10-CM | POA: Diagnosis not present

## 2019-04-08 DIAGNOSIS — D509 Iron deficiency anemia, unspecified: Secondary | ICD-10-CM | POA: Diagnosis not present

## 2019-04-08 DIAGNOSIS — E119 Type 2 diabetes mellitus without complications: Secondary | ICD-10-CM | POA: Diagnosis not present

## 2019-04-08 DIAGNOSIS — N2581 Secondary hyperparathyroidism of renal origin: Secondary | ICD-10-CM | POA: Diagnosis not present

## 2019-04-09 ENCOUNTER — Other Ambulatory Visit: Payer: Medicare Other | Admitting: Internal Medicine

## 2019-04-10 DIAGNOSIS — N186 End stage renal disease: Secondary | ICD-10-CM | POA: Diagnosis not present

## 2019-04-10 DIAGNOSIS — D509 Iron deficiency anemia, unspecified: Secondary | ICD-10-CM | POA: Diagnosis not present

## 2019-04-10 DIAGNOSIS — E119 Type 2 diabetes mellitus without complications: Secondary | ICD-10-CM | POA: Diagnosis not present

## 2019-04-10 DIAGNOSIS — N2581 Secondary hyperparathyroidism of renal origin: Secondary | ICD-10-CM | POA: Diagnosis not present

## 2019-04-10 DIAGNOSIS — E1129 Type 2 diabetes mellitus with other diabetic kidney complication: Secondary | ICD-10-CM | POA: Diagnosis not present

## 2019-04-13 DIAGNOSIS — N2581 Secondary hyperparathyroidism of renal origin: Secondary | ICD-10-CM | POA: Diagnosis not present

## 2019-04-13 DIAGNOSIS — E119 Type 2 diabetes mellitus without complications: Secondary | ICD-10-CM | POA: Diagnosis not present

## 2019-04-13 DIAGNOSIS — D509 Iron deficiency anemia, unspecified: Secondary | ICD-10-CM | POA: Diagnosis not present

## 2019-04-13 DIAGNOSIS — E1129 Type 2 diabetes mellitus with other diabetic kidney complication: Secondary | ICD-10-CM | POA: Diagnosis not present

## 2019-04-13 DIAGNOSIS — N186 End stage renal disease: Secondary | ICD-10-CM | POA: Diagnosis not present

## 2019-04-14 ENCOUNTER — Other Ambulatory Visit: Payer: Self-pay

## 2019-04-14 ENCOUNTER — Encounter: Payer: Medicare Other | Attending: Physician Assistant | Admitting: Physician Assistant

## 2019-04-14 DIAGNOSIS — N186 End stage renal disease: Secondary | ICD-10-CM | POA: Insufficient documentation

## 2019-04-14 DIAGNOSIS — E11622 Type 2 diabetes mellitus with other skin ulcer: Secondary | ICD-10-CM | POA: Insufficient documentation

## 2019-04-14 DIAGNOSIS — I120 Hypertensive chronic kidney disease with stage 5 chronic kidney disease or end stage renal disease: Secondary | ICD-10-CM | POA: Diagnosis not present

## 2019-04-14 DIAGNOSIS — N2581 Secondary hyperparathyroidism of renal origin: Secondary | ICD-10-CM | POA: Diagnosis not present

## 2019-04-14 DIAGNOSIS — E039 Hypothyroidism, unspecified: Secondary | ICD-10-CM | POA: Diagnosis not present

## 2019-04-14 DIAGNOSIS — E1140 Type 2 diabetes mellitus with diabetic neuropathy, unspecified: Secondary | ICD-10-CM | POA: Diagnosis not present

## 2019-04-14 DIAGNOSIS — Z880 Allergy status to penicillin: Secondary | ICD-10-CM | POA: Insufficient documentation

## 2019-04-14 DIAGNOSIS — E785 Hyperlipidemia, unspecified: Secondary | ICD-10-CM | POA: Diagnosis not present

## 2019-04-14 DIAGNOSIS — Z87891 Personal history of nicotine dependence: Secondary | ICD-10-CM | POA: Insufficient documentation

## 2019-04-14 DIAGNOSIS — Z992 Dependence on renal dialysis: Secondary | ICD-10-CM | POA: Diagnosis not present

## 2019-04-14 DIAGNOSIS — Z952 Presence of prosthetic heart valve: Secondary | ICD-10-CM | POA: Insufficient documentation

## 2019-04-14 DIAGNOSIS — E1122 Type 2 diabetes mellitus with diabetic chronic kidney disease: Secondary | ICD-10-CM | POA: Diagnosis not present

## 2019-04-14 DIAGNOSIS — L98419 Non-pressure chronic ulcer of buttock with unspecified severity: Secondary | ICD-10-CM | POA: Diagnosis not present

## 2019-04-14 NOTE — Progress Notes (Signed)
MING, MCMANNIS ([id number]) Visit Report for 04/14/2019 Abuse/Suicide Risk Screen Details Patient Name: [name], [name]. Date of Service: 04/14/2019 1:15 PM Medical Record Number: [id number] Patient Account Number: [id number] Date of Birth/Sex: 23-May-1941 (77 y.o. M) Treating RN: Army Melia Primary Care Amye Grego: Tedra [place]egal Other Clinician: Referring Skyleen Bentley: Tedra [place] Treating Juvencio Verdi/Extender: [name], HOYT Weeks in Treatment: 0 Abuse/Suicide Risk Screen Items Answer ABUSE RISK SCREEN: Has anyone close to you tried to hurt or harm you recentlyo No Do you feel uncomfortable with anyone in your familyo No Has anyone forced you do things that you didnot want to doo No Electronic Signature(s) Signed: 04/14/2019 3:27:22 PM By: Army Melia Entered By: Army Melia on 04/14/2019 13:25:36 Keathley, [name] ([id number]) -------------------------------------------------------------------------------- Activities of Daily Living Details Patient Name: [name]. Date of Service: 04/14/2019 1:15 PM Medical Record Number: [id number] Patient Account Number: [id number] Date of Birth/Sex: 1941/04/28 (77 y.o. M) Treating RN: Army Melia Primary Care Roshanda Balazs: Tedra [place]egal Other Clinician: Referring Sarae Nicholes: Tedra [place] Treating Tekelia Kareem/Extender: [name], HOYT Weeks in Treatment: 0 Activities of Daily Living Items Answer Activities of Daily Living (Please select one for each item) Drive Automobile Not Able Take Medications Completely Able Use Telephone Completely Able Care for Appearance Completely Able Use Toilet Completely Able Bath / Shower Need Assistance Dress Self Completely Able Feed Self Completely Able Walk Need Assistance Get In / Out Bed Need Assistance Housework Need Assistance Prepare Meals Completely Able Handle Money Completely Able Shop for Self Completely Able Electronic Signature(s) Signed: 04/14/2019 3:27:22 PM By: Army Melia Entered By: Army Melia on 04/14/2019 13:26:20 Hollan, [name] ([id number]) -------------------------------------------------------------------------------- Education Screening Details Patient Name: [name]. Date of Service: 04/14/2019 1:15 PM Medical Record Number: [id number] Patient Account Number: [id number] Date of Birth/Sex: 1941-04-10 (77 y.o. M) Treating RN: Army Melia Primary Care Delawrence Fridman: Tedra Senegal Other Clinician: Referring Kathy Wahid: Tedra [place]egal Treating Roniya Tetro/Extender: Sharalyn Ink in Treatment: 0 Primary Learner Assessed: Patient Learning Preferences/Education Level/Primary Language Learning Preference: Explanation, Demonstration Highest Education Level: College or Above Preferred Language: English Cognitive Barrier Language Barrier: No Translator Needed: No Memory Deficit: No Emotional Barrier: No Cultural/Religious Beliefs Affecting Medical Care: No Physical Barrier Impaired Vision: No Impaired Hearing: No Decreased Hand dexterity: No Knowledge/Comprehension Knowledge Level: High Comprehension Level: High Ability to understand written High instructions: Ability to understand verbal High instructions: Motivation Anxiety Level: Calm Cooperation: Cooperative Education Importance: Acknowledges Need Interest in Health Problems: Asks Questions Perception: Coherent Willingness to Engage in Self- High Management Activities: Readiness to Engage in Self- High Management Activities: Electronic Signature(s) Signed: 04/14/2019 3:27:22 PM By: Army Melia Entered By: Army Melia on 04/14/2019 13:26:44 Axtman, [name] ([id number]) -------------------------------------------------------------------------------- Fall Risk Assessment Details Patient Name: [name]. Date of Service: 04/14/2019 1:15 PM Medical Record Number: [id number] Patient Account Number: [id number] Date of Birth/Sex: [birth year] (77 y.o. M) Treating RN: Army Melia Primary Care  Kaylor Simenson: Tedra Senegal Other Clinician: Referring Margret Moat: Tedra [place]egal Treating Larosa Rhines/Extender: [name], HOYT Weeks in Treatment: 0 Fall Risk Assessment Items Have you had 2 or more falls in the last 12 monthso 0 Yes Have you had any fall that resulted in injury in the last 12 monthso 0 Yes FALLS RISK SCREEN History of falling - immediate or within 3 months 25 Yes Secondary diagnosis (Do you have 2 or more medical diagnoseso) 0 No Ambulatory aid None/bed rest/wheelchair/nurse 0 Yes Crutches/cane/walker 15 Yes Furniture 0 No Intravenous therapy Access/Saline/Heparin Lock 0 No Gait/Transferring Normal/ bed rest/ wheelchair 0 No  Weak (short steps with or without shuffle, stooped but able to lift head while 0 No walking, may seek support from furniture) Impaired (short steps with shuffle, may have difficulty arising from chair, head 0 No down, impaired balance) Mental Status Oriented to own ability 0 Yes Electronic Signature(s) Signed: 04/14/2019 3:27:22 PM By: Army Melia Entered By: Army Melia on 04/14/2019 13:27:20 Zaino, [name] ([id number]) -------------------------------------------------------------------------------- Nutrition Risk Screening Details Patient Name: [name]. Date of Service: 04/14/2019 1:15 PM Medical Record Number: [id number] Patient Account Number: [id number] Date of Birth/Sex: [birth year] (77 y.o. M) Treating RN: Army Melia Primary Care Renie Stelmach: Tedra [place]egal Other Clinician: Referring Salem Lembke: Tedra [place] Treating Luka Reisch/Extender: [name], HOYT Weeks in Treatment: 0 Height (in): 71 Weight (lbs): 154 Body Mass Index (BMI): 21.5 Nutrition Risk Screening Items Score Screening NUTRITION RISK SCREEN: I have an illness or condition that made me change the kind and/or amount of 0 No food I eat I eat fewer than two meals per day 0 No I eat few fruits and vegetables, or milk products 0 No I have three or more drinks of beer, liquor  or wine almost every day 0 No I have tooth or mouth problems that make it hard for me to eat 0 No I don't always have enough money to buy the food I need 0 No I eat alone most of the time 0 No I take three or more different prescribed or over-the-counter drugs a day 0 No Without wanting to, I have lost or gained 10 pounds in the last six months 0 No I am not always physically able to shop, cook and/or feed myself 0 No Nutrition Protocols Good Risk Protocol 0 No interventions needed Moderate Risk Protocol High Risk Proctocol Risk Level: Good Risk Score: 0 Electronic Signature(s) Signed: 04/14/2019 3:27:22 PM By: Army Melia Entered By: Army Melia on 04/14/2019 13:27:27

## 2019-04-15 DIAGNOSIS — E1129 Type 2 diabetes mellitus with other diabetic kidney complication: Secondary | ICD-10-CM | POA: Diagnosis not present

## 2019-04-15 DIAGNOSIS — N2581 Secondary hyperparathyroidism of renal origin: Secondary | ICD-10-CM | POA: Diagnosis not present

## 2019-04-15 DIAGNOSIS — E119 Type 2 diabetes mellitus without complications: Secondary | ICD-10-CM | POA: Diagnosis not present

## 2019-04-15 DIAGNOSIS — N186 End stage renal disease: Secondary | ICD-10-CM | POA: Diagnosis not present

## 2019-04-15 DIAGNOSIS — D509 Iron deficiency anemia, unspecified: Secondary | ICD-10-CM | POA: Diagnosis not present

## 2019-04-15 NOTE — Progress Notes (Signed)
[name], [name] ([id number]) Visit Report for 04/14/2019 Allergy List Details Patient Name: [name], [name]. Date of Service: 04/14/2019 1:15 PM Medical Record Number: [id number] Patient Account Number: [id number] Date of Birth/Sex: 11-11-1940 (78 y.o. M) Treating RN: Montey Hora Primary Care Naveen Clardy: Tedra Senegal Other Clinician: Referring Roneisha Stern: Tedra [place] Treating Suren Payne/Extender: STONE III, HOYT Weeks in Treatment: 0 Allergies Active Allergies penicillin Reaction: hives Severity: Mild Allergy Notes Electronic Signature(s) Signed: 04/14/2019 4:59:24 PM By: Montey Hora Entered By: Montey Hora on 04/14/2019 13:54:37 Hove, [name] ([id number]) -------------------------------------------------------------------------------- Arrival Information Details Patient Name: [name]. Date of Service: 04/14/2019 1:15 PM Medical Record Number: [id number] Patient Account Number: [id number] Date of Birth/Sex: 02/18/1941 (78 y.o. M) Treating RN: Montey Hora Primary Care Cruise Baumgardner: Tedra [place] Other Clinician: Referring Brandom Kerwin: Tedra [place] Treating Erroll Wilbourne/Extender: [name], HOYT Weeks in Treatment: 0 Visit Information Patient Arrived: Walker Arrival Time: 13:08 Accompanied By: wife Transfer Assistance: None Patient Identification Verified: Yes Secondary Verification Process Completed: Yes History Since Last Visit Added or deleted any medications: No Any new allergies or adverse reactions: No Had a fall or experienced change in activities of daily living that may affect risk of falls: No Signs or symptoms of abuse/neglect since last visito No Hospitalized since last visit: No Implantable device outside of the clinic excluding cellular tissue based products placed in the center since last visit: No Electronic Signature(s) Signed: 04/14/2019 2:24:05 PM By: Lorine Bears RCP, RRT, CHT Entered By: Lorine Bears on 04/14/2019  13:08:42 Puopolo, [name] ([id number]) -------------------------------------------------------------------------------- Clinic Level of Care Assessment Details Patient Name: [name], [name]. Date of Service: 04/14/2019 1:15 PM Medical Record Number: [id number] Patient Account Number: [id number] Date of Birth/Sex: Apr 10, 1941 (78 y.o. M) Treating RN: Montey Hora Primary Care Phuong Moffatt: Tedra [place] Other Clinician: Referring Genevieve Ritzel: Tedra [place] Treating Lavette Yankovich/Extender: [name], HOYT Weeks in Treatment: 0 Clinic Level of Care Assessment Items TOOL 2 Quantity Score []  - Use when only an EandM is performed on the INITIAL visit 0 ASSESSMENTS - Nursing Assessment / Reassessment X - General Physical Exam (combine w/ comprehensive assessment (listed just below) when 1 20 performed on new pt. evals) X- 1 25 Comprehensive Assessment (HX, ROS, Risk Assessments, Wounds Hx, etc.) ASSESSMENTS - Wound and Skin Assessment / Reassessment []  - Simple Wound Assessment / Reassessment - one wound 0 []  - 0 Complex Wound Assessment / Reassessment - multiple wounds X- 1 10 Dermatologic / Skin Assessment (not related to wound area) ASSESSMENTS - Ostomy and/or Continence Assessment and Care []  - Incontinence Assessment and Management 0 []  - 0 Ostomy Care Assessment and Management (repouching, etc.) PROCESS - Coordination of Care X - Simple Patient / Family Education for ongoing care 1 15 []  - 0 Complex (extensive) Patient / Family Education for ongoing care X- 1 10 Staff obtains Programmer, systems, Records, Test Results / Process Orders []  - 0 Staff telephones HHA, Nursing Homes / Clarify orders / etc []  - 0 Routine Transfer to another Facility (non-emergent condition) []  - 0 Routine Hospital Admission (non-emergent condition) []  - 0 New Admissions / Biomedical engineer / Ordering NPWT, Apligraf, etc. []  - 0 Emergency Hospital Admission (emergent condition) X- 1 10 Simple Discharge  Coordination []  - 0 Complex (extensive) Discharge Coordination PROCESS - Special Needs []  - Pediatric / Minor Patient Management 0 []  - 0 Isolation Patient Management [name], [name] ([id number]) []  - 0 Hearing / Language / Visual special needs []  - 0 Assessment of Community assistance (transportation, D/C planning, etc.) []  -  0 Additional assistance / Altered mentation []  - 0 Support Surface(s) Assessment (bed, cushion, seat, etc.) INTERVENTIONS - Wound Cleansing / Measurement []  - Wound Imaging (photographs - any number of wounds) 0 []  - 0 Wound Tracing (instead of photographs) []  - 0 Simple Wound Measurement - one wound []  - 0 Complex Wound Measurement - multiple wounds []  - 0 Simple Wound Cleansing - one wound []  - 0 Complex Wound Cleansing - multiple wounds INTERVENTIONS - Wound Dressings X - Small Wound Dressing one or multiple wounds 1 10 []  - 0 Medium Wound Dressing one or multiple wounds []  - 0 Large Wound Dressing one or multiple wounds []  - 0 Application of Medications - injection INTERVENTIONS - Miscellaneous []  - External ear exam 0 []  - 0 Specimen Collection (cultures, biopsies, blood, body fluids, etc.) []  - 0 Specimen(s) / Culture(s) sent or taken to Lab for analysis []  - 0 Patient Transfer (multiple staff / Civil Service fast streamer / Similar devices) []  - 0 Simple Staple / Suture removal (25 or less) []  - 0 Complex Staple / Suture removal (26 or more) []  - 0 Hypo / Hyperglycemic Management (close monitor of Blood Glucose) []  - 0 Ankle / Brachial Index (ABI) - do not check if billed separately Has the patient been seen at the hospital within the last three years: Yes Total Score: 100 Level Of Care: New/Established - Level 3 Electronic Signature(s) Signed: 04/14/2019 4:59:24 PM By: Montey Hora Entered By: Montey Hora on 04/14/2019 14:07:26 [name], [name]  ([id number]) -------------------------------------------------------------------------------- Lower Extremity Assessment Details Patient Name: [name]. Date of Service: 04/14/2019 1:15 PM Medical Record Number: [id number] Patient Account Number: [id number] Date of Birth/Sex: 12/01/1940 (77 y.o. M) Treating RN: Army Melia Primary Care Gayleen Sholtz: Tedra [place] Other Clinician: Referring Erryn Dickison: Tedra [place] Treating Pina Sirianni/Extender: [name], HOYT Weeks in Treatment: 0 Electronic Signature(s) Signed: 04/14/2019 3:27:22 PM By: Army Melia Entered By: Army Melia on 04/14/2019 13:23:12 [name], [name] ([id number]) -------------------------------------------------------------------------------- Pain Assessment Details Patient Name: [name]. Date of Service: 04/14/2019 1:15 PM Medical Record Number: [id number] Patient Account Number: [id number] Date of Birth/Sex: December 26, 1940 (77 y.o. M) Treating RN: Montey Hora Primary Care Breckin Savannah: Tedra [place] Other Clinician: Referring Akasia Ahmad: Tedra [place] Treating Errika Narvaiz/Extender: [name], HOYT Weeks in Treatment: 0 Active Problems Location of Pain Severity and Description of Pain Patient Has Paino No Site Locations Pain Management and Medication Current Pain Management: Electronic Signature(s) Signed: 04/14/2019 2:24:05 PM By: Paulla Fore, RRT, CHT Signed: 04/14/2019 4:59:24 PM By: Montey Hora Entered By: Lorine Bears on 04/14/2019 13:08:53 [name], [name] ([id number]) -------------------------------------------------------------------------------- Patient/Caregiver Education Details Patient Name: [name]. Date of Service: 04/14/2019 1:15 PM Medical Record Number: [id number] Patient Account Number: [id number] Date of Birth/Gender: 06-May-1941 (78 y.o. M) Treating RN: Montey Hora Primary Care Physician: Tedra [place] Other Clinician: Referring Physician: Tedra [place] Treating Physician/Extender: Sharalyn Ink in Treatment: 0 Education Assessment Education Provided To: Patient Education Topics Provided Pressure: Handouts: Other: pressure relief measures Methods: Explain/Verbal Responses: State content correctly Electronic Signature(s) Signed: 04/14/2019 4:59:24 PM By: Montey Hora Entered By: Montey Hora on 04/14/2019 14:07:49 [name], [name] ([id number]) -------------------------------------------------------------------------------- Vitals Details Patient Name: [name]. Date of Service: 04/14/2019 1:15 PM Medical Record Number: [id number] Patient Account Number: [id number] Date of Birth/Sex: 15-Oct-1940 (77 y.o. M) Treating RN: Montey Hora Primary Care Chevella Pearce: Tedra [place] Other Clinician: Referring Mayah Urquidi: Tedra [place] Treating Johnpaul Gillentine/Extender: STONE III, HOYT Weeks in Treatment: 0 Vital Signs Time Taken: 13:08 Temperature (F):  97.3 Height (in): 71 Pulse (bpm): 72 Source: Stated Respiratory Rate (breaths/min): 16 Weight (lbs): 154 Blood Pressure (mmHg): 91/54 Source: Stated Reference Range: 80 - 120 mg / dl Body Mass Index (BMI): 21.5 Electronic Signature(s) Signed: 04/14/2019 2:24:05 PM By: Lorine Bears RCP, RRT, CHT Entered By: Lorine Bears on 04/14/2019 13:15:36

## 2019-04-15 NOTE — Progress Notes (Signed)
[name], [name] ([id number]) Visit Report for 04/14/2019 Chief Complaint Document Details Patient Name: [name]. Date of Service: 04/14/2019 1:15 PM Medical Record Number: [id number] Patient Account Number: [id number] Date of Birth/Sex: [birth year] (77 y.o. M) Treating RN: Montey Hora Primary Care Provider: Tedra Senegal Other Clinician: Referring Provider: Tedra Senegal Treating Provider/Extender: [name], Melika Reder Weeks in Treatment: 0 Information Obtained from: Patient Chief Complaint Recurrent gluteal ulcers Electronic Signature(s) Signed: 04/14/2019 6:14:16 PM By: Worthy Keeler PA-C Entered By: Worthy Keeler on 04/14/2019 14:13:31 Liggett, [name] ([id number]) -------------------------------------------------------------------------------- HPI Details Patient Name: [name]. Date of Service: 04/14/2019 1:15 PM Medical Record Number: [id number] Patient Account Number: [id number] Date of Birth/Sex: 09/14/1941 (77 y.o. M) Treating RN: Montey Hora Primary Care Provider: Tedra Senegal Other Clinician: Referring Provider: Tedra Senegal Treating Provider/Extender: [name], Aarilyn Dye Weeks in Treatment: 0 History of Present Illness HPI Description: 78 year old gentleman seen earlier this year in January for a abdominal wound is now back with the same problem which has recurred for about 2 weeks and he has been trying to apply some local antibiotic ointment and a Band-Aid there. Of note he has significant pressure in this area due to his beltline and may have put on some weight which results in his trousers being very tight around the waist. The else has changed in his HandP and his diabetes is pretty well controlled. most recent hemoglobin A1c on 12/06/2016 was 7.8%. he was reviewed by his endocrinologist Dr. Dwyane Dee who made appropriate modifications to his treatment plan. he was recently seen by neurology for mild cognitive impairment that was found to be normal for his  age 05/14/17 patient appears to be doing well in regard to his abdominal wound area on evaluation today. He has been tolerating the dressing changes with the [facility] Dressing I'm pleased with how this has progressed and in fact the wound appears to be completely healed on evaluation today. There is definitely no evidence of infection. 03/29/17 Patient's wound on the abdominal region appears to be completely healed on evaluation today. This wound has been doing very well and I think the biggest thing is going to be keeping it from reactivation where it rubbed on his pants. 04/18/2017 -- he was recently discharged about 3 weeks ago and was using a binder which probably caused him abrasion on the area of the recently healed scar. 05/02/17 on evaluation today patient appears to be doing fairly well in regard to his abdominal wound at the site of the surgical scar. Unfortunately the scar tissue is very fragile and tends to reopen. He did get a small abdominal binder to try to pad the area in order to offload and prevent his pants from rubbing specifically at the site. With that being said he tells me that Dr. Con Memos was concerned that the binder might actually have rub the area causing a reopening. I did have a look at this today and unfortunately it appears that he was putting this over his pants which was making matters worse. 05/09/17 on evaluation today patient continues to do well with the [facility] Dressing. There is a slight skin tear inferior to the wound that we have been treating that may have been due to the dressing sticking slightly. This is minimal however. I did discuss with patient prior to removal of the dressing for dressing changes that they whet this in order to loosen it and not risk damaging in the field. Nonetheless it appears that they have been  doing very well with the dressing changes he had his wife took over the dressing changes as she states that he was doing it wrong.  Overall though I'm pleased with how things are progressing. No fevers, chills, nausea, or vomiting noted at this time. Readmission: 08/27/17 on evaluation today patient appears to be doing similarly to how he was doing prior to healing previously August 2018 when we previously discharged him. He has fortunately been doing well up until just a couple of weeks ago and he had some of the [facility] Dressing over so he began to treat this on his own. He states that this has gotten much smaller but he ran out of supplies and therefore made an appointment to come back in for evaluation. No fevers, chills, nausea, or vomiting noted at this time. He has no discomfort at this point in regard to the wound. He does tell me that he has one maybe two compression fractures in his lower back and that he is going to be seen by the physician at [facility] imaging, Dr. Estanislado Pandy. ====== Old notes The 78 year old gentleman who has a past medical history of diabetes mellitus, end-stage renal disease on dialysis, hypertension, secondary hyperparathyroidism, peripheral vascular disease and history of previous arterial bifemoral bypass graft in 1992 also has a history of gout, hyperlipidemia, hypothyroidism. last hemoglobin A1c was 7.5%. status post AV fistula [name], [name]. ([id number]) placement, breast surgery for left granulomatous mastitis, triple a repair in 1992, small bowel obstruction, upper arm angiograms for AV fistula on the left side. was noted to have a superficial skin wound in the periumbilical area since late December 2017. He was asked to clean it with hydrogen peroxide and apply Bactroban. He was seen by vascular surgery in October 2017 where his right ABI was 0.56 left ABI was 0.87 and right TBI was 0.69 and left TBI was 0.70 ===== 09/25/16; patient was previously a patient of Dr. Ardeen Garland who has recurrent skin breakdown in the area of the large incision that was initially 4 apparently an  open AAA aneurysm repair. He had secondary surgery but I'm not sure of what etiology this was. He is been using Hydrofera Blue to the open wounds in the mid abdominal surgical incision. This is now closed over. I think he also uses this area as his waist for his pants and I've asked him to adjust this Readmission: 02/11/18 on evaluation today patient presents for evaluation concerning a sacral pressure ulcer which has been present for several weeks. He states that he actually had several areas that were open there appears just be one remaining. He has been tolerating the dressing changes is wise been using Hydrofera Blue Dressing as left over from his abdominal ulcer that we have previously treated. Fortunately there does not appear to be any evidence of infection which is good news. With that being said he states that he's been having issues at one point even at dialysis he had to discontinue dialysis and go home due to the pain. He has not been using any cushion to sit on although he states he does have one that he can use. No fevers, chills, nausea, or vomiting noted at this time. He had a significant issue with diarrhea prior to this occurring and I believe this may have had some contributing factor as well. 02/18/18 on evaluation today patient's wound actually appears to be doing about the same. Unfortunately he really has not noted any significant improvement over last week he still  is very tender even just to light touch unfortunately over the periwound region. There does not appear to be any evidence of infection which is good news. He is especially tender at the 12 o'clock location even a full fingers with away from the actual wound site. There still slough covering the surface of the wound. 03/04/18 on evaluation today patient's wound actually appears to show signs of great improvement compared to last week's evaluation. He is definitely showing improvement week by week which I think is  great news. Obviously that is what we are looking for. Nonetheless I did not even need to perform any sharp debridement today being that there was no slough noted on the surface of the wound. His blood pressure continues to remain low although this is something that is normal. That is for him. 03/18/18 on evaluation today patient's wound actually appears to potentially be completely healed although I cannot tell for sure. He has a small indeed very small eschar noted over the wound surface which also was measuring smaller than what it was previous. With that being said he has discomfort to the point that I'm not really able to do much with this in order to remove the eschar and prove that everything is completely healed underneath. Nonetheless it's starting to look like this may lift up on its own and I think that may be the best course for him currently. 04/01/18 on evaluation today patient appears to be doing very well in fact his wound is completely healed today. He's been tolerating the dressing changes without complication. With that being said I do believe at this point that he is ready for discharge she still having some discomfort deeper and he tells me this may just be some irritation from the long periods of time he has to sit for dialysis nonetheless he's not having the redness that was previously noted I think he has done very well trying to keep pressure off of this area. Readmission: 09/11/18 on evaluation today patient presents for reevaluation here in our clinic due to a remission with ulcers of the abdominal area. This is a region that he has previously been treated for bar clinic as well we were able to get this healed. 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 He states he was in the hospital when this just suddenly came up toward the end of his hospital stay. There really is nothing else currently going on  indicating more significant issue at this point. Fortunately No fevers, chills, nausea, or vomiting noted at this time. The patient does have diabetes, is on dialysis, and states that the area does tend to hurt but only near the bottom of the wound though this is rather painful. 10/02/18 on evaluation today patient's abdominal ulcer is actually appear to be doing excellent at this point. He's been tolerating the dressing changes without complication. I do believe that this is doing very well for him. No fevers, chills, nausea, or Siebel, TARIK TEIXEIRA. ([id number]) vomiting noted at this time. 10/09/18 on evaluation today patient appears to be doing very well in regard to his abdominal ulcers. In fact one area is almost completely healed. The other is shown signs of good epithelialization at this point. Overall I see no signs of infection and things seem to be progressing nicely. 10/16/18 on evaluation today patient appears to be doing well in regard to his abdominal ulcers. He has been tolerating the dressing  changes without complication. Fortunately there is no signs of infection at this time. Overall he seems to be doing very well. 10/23/18 on evaluation today patient actually appears to be doing very well in regard to his abdominal ulcer. This area has continued to heal quite nicely which is excellent news. Overall I'm very pleased with the progress he has made. 11/06/18 on evaluation today patient actually appears to be doing excellent in regard to his which is good news. Fortunately there's no signs of infection at this time. No fevers, chills, nausea, or vomiting noted at this time. In general the patient's been very pleased with how things seem to be progressing which is excellent news as well. Overall there's no signs of infection at this time. 11/13/18 on evaluation today patient appears to be healed in regard to his abdominal ulcer. He's been tolerating the dressing changes without complication.  Fortunately there does not appear to be any signs of infection at this time which is good news. I'm very pleased with how everything seems to be progressing. 11/27/18 on evaluation today patient actually appears to be completely healed in regard to his abdominal ulcer. He has done extremely well in this regard I'm very pleased. Fortunately there does not appear to be any signs of infection which is also good news. Overall I think that he has been doing extremely well with regard to how quickly this ulcer healed. Readmission: 04/14/19 patient presents today for reevaluation in clinic concerning the same issue which is a concern for wound in the gluteal region with that being said upon inspection today he actually seems to be doing quite well which is excellent news at this point. In fact when I evaluated the area of concern he does not appear to have any obvious signs of active infection at this time nor any open wound for that matter. Though this is excellent news nonetheless he is still concerned about the fact that he has pain at the site. He tells me he has not been using his cushion which I previously recommended for him. Electronic Signature(s) Signed: 04/14/2019 6:14:16 PM By: Worthy Keeler PA-C Entered By: Worthy Keeler on 04/14/2019 17:28:19 Jerger, [name] ([id number]) -------------------------------------------------------------------------------- Physical Exam Details Patient Name: [name]. Date of Service: 04/14/2019 1:15 PM Medical Record Number: [id number] Patient Account Number: [id number] Date of Birth/Sex: [birth year] (77 y.o. M) Treating RN: Montey Hora Primary Care Provider: Tedra Senegal Other Clinician: Referring Provider: Tedra Senegal Treating Provider/Extender: [name], Maximos Zayas Weeks in Treatment: 0 Constitutional sitting or standing blood pressure is within target range for patient.. pulse regular and within target range for patient.Marland Kitchen respirations regular,  non-labored and within target range for patient.Marland Kitchen temperature within target range for patient.. Well- nourished and well-hydrated in no acute distress. Eyes conjunctiva clear no eyelid edema noted. pupils equal round and reactive to light and accommodation. Ears, Nose, Mouth, and Throat no gross abnormality of ear auricles or external auditory canals. normal hearing noted during conversation. mucus membranes moist. Respiratory normal breathing without difficulty. clear to auscultation bilaterally. Cardiovascular regular rate and rhythm with normal S1, S2. Gastrointestinal (GI) soft, non-tender, non-distended, +BS. no ventral hernia noted. Musculoskeletal stooped posture. no significant deformity or arthritic changes, no loss or range of motion, no clubbing. Psychiatric this patient is able to make decisions and demonstrates good insight into disease process. Alert and Oriented x 3. pleasant and cooperative. Notes Upon inspection today patient's wound bed actually showed signs of complete epithelialization I see were there might've  been a wound recently open but there did not appear to be anything open at this time. I think he is having discomfort at the site but again I believe the main issue here is that he continues to set for dialysis he's not taking his cushion with him and this is making it more difficult on him as far as the four hour time that he sits without appropriate cushioning. Electronic Signature(s) Signed: 04/14/2019 6:14:16 PM By: Worthy Keeler PA-C Entered By: Worthy Keeler on 04/14/2019 17:29:43 Ellerbrock, [name] ([id number]) -------------------------------------------------------------------------------- Physician Orders Details Patient Name: [name]. Date of Service: 04/14/2019 1:15 PM Medical Record Number: [id number] Patient Account Number: [id number] Date of Birth/Sex: 06-29-1941 (77 y.o. 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 Date of Service: 04/14/2019 1:15 PM Medical Record Number: [id number] Patient Account Number: [id number] Date of Birth/Sex: 1941-06-18 (77 y.o. 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 Date of Service: 04/14/2019 1:15 PM Medical Record Number: [id number] Patient Account Number: [id number] Date of Birth/Sex: 09/02/1941 (77 y.o. M) Treating RN: Montey Hora Primary Care Provider: Tedra Senegal Other  Clinician: Referring Provider: Tedra Senegal Treating Provider/Extender: [name], Erlinda Solinger Weeks in Treatment: 0 Subjective Chief Complaint Information obtained from Patient Recurrent gluteal ulcers History of Present Illness (HPI) 78 year old gentleman seen earlier this year in January for a abdominal wound is now back with the same problem which has recurred for about 2 weeks and he has been trying to apply some local antibiotic ointment and a Band-Aid there. Of note he has significant pressure in this area due to his beltline and may have put on some weight which results in his trousers being very tight around the waist. The else has changed in his HandP and his diabetes is pretty well controlled. most recent hemoglobin A1c on 12/06/2016 was 7.8%. he was reviewed by his endocrinologist Dr. Dwyane Dee who made appropriate modifications to his treatment plan. he was recently seen by neurology for mild cognitive impairment that was found to be normal for his age 56/21/18 patient appears to be doing well in regard to his abdominal wound area on evaluation today. He has been tolerating the dressing changes with the [facility] Dressing I'm pleased with how this has progressed and in fact the wound appears to be completely healed on evaluation today. There is definitely no evidence of infection. 03/29/17 Patient's  wound on the abdominal region appears to be completely healed on evaluation today. This wound has been doing very well and I think the biggest thing is going to be keeping it from reactivation where it rubbed on his pants. 04/18/2017 -- he was recently discharged about 3 weeks ago and was using a binder which probably caused him abrasion on the area of the recently healed scar. 05/02/17 on evaluation today patient appears to be doing fairly well in regard to his abdominal wound at the site of the surgical scar. Unfortunately the scar tissue is very fragile and tends to reopen. He did get a small  abdominal binder to try to pad the area in order to offload and prevent his pants from rubbing specifically at the site. With that being said he tells me that Dr. Con Memos was concerned that the binder might actually have rub the area causing a reopening. I did have a look at this today and unfortunately it appears that he was putting this over his pants which was making matters worse. 05/09/17 on evaluation today patient continues to do well with the [facility] Dressing. There is a slight skin tear inferior to the wound that we have been treating that may have been due to the dressing sticking slightly. This is minimal however. I did discuss with patient prior to removal of the dressing for dressing changes that they whet this in order to loosen it and not risk damaging in the field. Nonetheless it appears that they have been doing very well with the dressing changes he had his wife took over the dressing changes as she states that he was doing it wrong. Overall though I'm pleased with how things are progressing. No fevers, chills, nausea, or vomiting noted at this time. Readmission: 08/27/17 on evaluation today patient appears to be doing similarly to how he was doing prior to healing previously August 2018 when we previously discharged him. He has fortunately been doing well up until just a couple of weeks ago and he had some of the [facility] Dressing over so he began to treat this on his own. He states that this has gotten much smaller but he ran out of supplies and therefore made an appointment to come back in for evaluation. No fevers, chills, nausea, or vomiting noted at this time. He has no discomfort at this point in regard to the wound. He does tell me that he has one maybe two compression fractures in his lower back and that he is going to be seen by the physician at [facility] imaging, Dr. Estanislado Pandy. Ragon, SHIV SHUEY ([id number]) ====== Old notes The 78 year old gentleman who has  a past medical history of diabetes mellitus, end-stage renal disease on dialysis, hypertension, secondary hyperparathyroidism, peripheral vascular disease and history of previous arterial bifemoral bypass graft in 1992 also has a history of gout, hyperlipidemia, hypothyroidism. last hemoglobin A1c was 7.5%. status post AV fistula placement, breast surgery for left granulomatous mastitis, triple a repair in 1992, small bowel obstruction, upper arm angiograms for AV fistula on the left side. was noted to have a superficial skin wound in the periumbilical area since late December 2017. He was asked to clean it with hydrogen peroxide and apply Bactroban. He was seen by vascular surgery in October 2017 where his right ABI was 0.56 left ABI was 0.87 and right TBI was 0.69 and left TBI was 0.70 ===== 09/25/16; patient was previously a patient of Dr. Ardeen Garland who has recurrent skin breakdown in the  area of the large incision that was initially 4 apparently an open AAA aneurysm repair. He had secondary surgery but I'm not sure of what etiology this was. He is been using Hydrofera Blue to the open wounds in the mid abdominal surgical incision. This is now closed over. I think he also uses this area as his waist for his pants and I've asked him to adjust this Readmission: 02/11/18 on evaluation today patient presents for evaluation concerning a sacral pressure ulcer which has been present for several weeks. He states that he actually had several areas that were open there appears just be one remaining. He has been tolerating the dressing changes is wise been using Hydrofera Blue Dressing as left over from his abdominal ulcer that we have previously treated. Fortunately there does not appear to be any evidence of infection which is good news. With that being said he states that he's been having issues at one point even at dialysis he had to discontinue dialysis and go home due to the pain. He has not been using  any cushion to sit on although he states he does have one that he can use. No fevers, chills, nausea, or vomiting noted at this time. He had a significant issue with diarrhea prior to this occurring and I believe this may have had some contributing factor as well. 02/18/18 on evaluation today patient's wound actually appears to be doing about the same. Unfortunately he really has not noted any significant improvement over last week he still is very tender even just to light touch unfortunately over the periwound region. There does not appear to be any evidence of infection which is good news. He is especially tender at the 12 o'clock location even a full fingers with away from the actual wound site. There still slough covering the surface of the wound. 03/04/18 on evaluation today patient's wound actually appears to show signs of great improvement compared to last week's evaluation. He is definitely showing improvement week by week which I think is great news. Obviously that is what we are looking for. Nonetheless I did not even need to perform any sharp debridement today being that there was no slough noted on the surface of the wound. His blood pressure continues to remain low although this is something that is normal. That is for him. 03/18/18 on evaluation today patient's wound actually appears to potentially be completely healed although I cannot tell for sure. He has a small indeed very small eschar noted over the wound surface which also was measuring smaller than what it was previous. With that being said he has discomfort to the point that I'm not really able to do much with this in order to remove the eschar and prove that everything is completely healed underneath. Nonetheless it's starting to look like this may lift up on its own and I think that may be the best course for him currently. 04/01/18 on evaluation today patient appears to be doing very well in fact his wound is completely healed  today. He's been tolerating the dressing changes without complication. With that being said I do believe at this point that he is ready for discharge she still having some discomfort deeper and he tells me this may just be some irritation from the long periods of time he has to sit for dialysis nonetheless he's not having the redness that was previously noted I think he has done very well trying to keep pressure off of this area. Readmission: 09/11/18  on evaluation today patient presents for reevaluation here in our clinic due to a remission with ulcers of the abdominal area. This is a region that he has previously been treated for bar clinic as well we were able to get this healed. 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 He states he was in the hospital when this just suddenly came up toward the end of his hospital stay. There really is nothing else currently going on indicating more significant issue at this point. Fortunately No fevers, chills, nausea, or vomiting noted at this time. Shughart, DARSH VANDEVOORT ([id number]) The patient does have diabetes, is on dialysis, and states that the area does tend to hurt but only near the bottom of the wound though this is rather painful. 10/02/18 on evaluation today patient's abdominal ulcer is actually appear to be doing excellent at this point. He's been tolerating the dressing changes without complication. I do believe that this is doing very well for him. No fevers, chills, nausea, or vomiting noted at this time. 10/09/18 on evaluation today patient appears to be doing very well in regard to his abdominal ulcers. In fact one area is almost completely healed. The other is shown signs of good epithelialization at this point. Overall I see no signs of infection and things seem to be progressing nicely. 10/16/18 on evaluation today patient appears to be doing well in regard to his abdominal  ulcers. He has been tolerating the dressing changes without complication. Fortunately there is no signs of infection at this time. Overall he seems to be doing very well. 10/23/18 on evaluation today patient actually appears to be doing very well in regard to his abdominal ulcer. This area has continued to heal quite nicely which is excellent news. Overall I'm very pleased with the progress he has made. 11/06/18 on evaluation today patient actually appears to be doing excellent in regard to his which is good news. Fortunately there's no signs of infection at this time. No fevers, chills, nausea, or vomiting noted at this time. In general the patient's been very pleased with how things seem to be progressing which is excellent news as well. Overall there's no signs of infection at this time. 11/13/18 on evaluation today patient appears to be healed in regard to his abdominal ulcer. He's been tolerating the dressing changes without complication. Fortunately there does not appear to be any signs of infection at this time which is good news. I'm very pleased with how everything seems to be progressing. 11/27/18 on evaluation today patient actually appears to be completely healed in regard to his abdominal ulcer. He has done extremely well in this regard I'm very pleased. Fortunately there does not appear to be any signs of infection which is also good news. Overall I think that he has been doing extremely well with regard to how quickly this ulcer healed. Readmission: 04/14/19 patient presents today for reevaluation in clinic concerning the same issue which is a concern for wound in the gluteal region with that being said upon inspection today he actually seems to be doing quite well which is excellent news at this point. In fact when I evaluated the area of concern he does not appear to have any obvious signs of active infection at this time nor any open wound for that matter. Though this is excellent news  nonetheless he is still concerned about the fact that he has pain at the site. He tells  me he has not been using his cushion which I previously recommended for him. Patient History Information obtained from Patient. Allergies penicillin (Severity: Mild, Reaction: hives) Family History Diabetes - Father,Paternal Grandparents, Heart Disease - Father, Hypertension - Father, No family history of Cancer, Hereditary Spherocytosis, Kidney Disease, Lung Disease, Seizures, Stroke, Thyroid Problems, Tuberculosis. Social History Former smoker - quit in 1996, Marital Status - Married, Alcohol Use - Never - quit in 1976, Drug Use - No History, Caffeine Use - Never. Medical History Eyes Denies history of Cataracts, Glaucoma, Optic Neuritis Ear/Nose/Mouth/Throat Backer, AASHRITH EVES ([id number]) Denies history of Chronic sinus problems/congestion, Middle ear problems Hematologic/Lymphatic Patient has history of Anemia Denies history of Hemophilia, Human Immunodeficiency Virus, Lymphedema, Sickle Cell Disease Cardiovascular Patient has history of Hypertension, Peripheral Venous Disease Denies history of Angina, Arrhythmia, Congestive Heart Failure, Coronary Artery Disease, Deep Vein Thrombosis, Hypotension, Myocardial Infarction, Peripheral Arterial Disease, Phlebitis, Vasculitis Gastrointestinal Denies history of Cirrhosis , Colitis, Crohn s, Hepatitis A, Hepatitis B, Hepatitis C Endocrine Patient has history of Type II Diabetes Denies history of Type I Diabetes Genitourinary 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 Blood sugar is tested. Hospitalization/Surgery History - [place]. 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 Hematologic/Lymphatic Denies complaints or symptoms of Bleeding / Clotting Disorders, Human Immunodeficiency Virus. Respiratory Denies complaints or symptoms of Chronic or frequent coughs, Shortness of Breath. Cardiovascular Denies complaints or symptoms of Chest pain, LE edema. Gastrointestinal Denies complaints or symptoms of Frequent diarrhea, Nausea, Vomiting. Endocrine Denies complaints or symptoms of Hepatitis, Thyroid disease, Polydypsia (Excessive Thirst). Genitourinary Complains or has symptoms of Kidney failure/ Dialysis. Immunological Denies complaints or symptoms of Hives, Itching. Integumentary (Skin) Complains or has symptoms of Wounds. Hakanson, IRVAN TIEDT ([id number]) Denies complaints or symptoms of Bleeding or bruising tendency, Breakdown, Swelling. Musculoskeletal Denies complaints or symptoms of Muscle Pain, Muscle Weakness. Psychiatric Denies complaints or symptoms of Anxiety, Claustrophobia. Objective Constitutional sitting or standing blood pressure is within target range for patient.. pulse regular and within target range for patient.Marland Kitchen respirations regular, non-labored and within target range for patient.Marland Kitchen temperature within target range for patient.. Well- nourished and well-hydrated in no acute distress. Vitals Time Taken: 1:08 PM, Height: 71 in, Source: Stated, Weight: 154 lbs, Source: Stated, BMI: 21.5, Temperature: 97.3  F, Pulse: 72 bpm, Respiratory Rate: 16 breaths/min, Blood Pressure: 91/54 mmHg. Eyes conjunctiva clear no eyelid edema noted. pupils equal round and reactive to light and accommodation. Ears, Nose, Mouth, and Throat no gross abnormality of ear auricles or external auditory canals. normal hearing noted during conversation. mucus membranes moist. Respiratory normal breathing without difficulty. clear to auscultation bilaterally. Cardiovascular regular rate and rhythm with normal S1, S2. Gastrointestinal (GI) soft, non-tender, non-distended, +BS. no ventral hernia noted. Musculoskeletal stooped posture. no significant deformity or arthritic changes, no loss or range of motion, no clubbing. Psychiatric this patient is able to make decisions and demonstrates good insight  into disease process. Alert and Oriented x 3. pleasant and cooperative. General Notes: Upon inspection today patient's wound bed actually showed signs of complete epithelialization I see were there might've been a wound recently open but there did not appear to be anything open at this time. I think he is having discomfort at the site but again I believe the main issue here is that he continues to set for dialysis he's not taking his cushion with him and this is making it more difficult on him as far as the four hour time that he sits without appropriate cushioning. 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 Nonetheless I don't think there's any need for ongoing wound care as he has no open wounds at this point this is excellent news. I recommend that he and  his wife keep an eye on the area and if anything changes or worsens in the meantime he can contact the office and let me know otherwise we will see were things stand at follow-up as needed. Electronic Signature(s) Signed: 04/14/2019 6:14:16 PM By: Worthy Keeler PA-C Entered By: Worthy Keeler on 04/14/2019 17:30:38 Kontz, [name] ([id number]) -------------------------------------------------------------------------------- ROS/PFSH Details Patient Name: [name]. Date of Service: 04/14/2019 1:15 PM Medical Record Number: [id number] Patient Account Number: [id number] Date of Birth/Sex: May 27, 1941 (77 y.o. M) Treating RN: Army Melia Primary Care Provider: Tedra Senegal Other Clinician: Referring Provider: Tedra Senegal Treating Provider/Extender: [name], Ravynn Hogate Weeks in Treatment: 0 Information Obtained From Patient Eyes Complaints and Symptoms: Negative for: Dry Eyes; Vision Changes; Glasses / Contacts Medical History: Negative for: Cataracts; Glaucoma; Optic Neuritis Hematologic/Lymphatic Complaints and Symptoms: Negative for: Bleeding / Clotting Disorders; Human Immunodeficiency Virus Medical History: Positive for: Anemia Negative for: Hemophilia; Human Immunodeficiency Virus; Lymphedema; Sickle Cell Disease Respiratory Complaints and Symptoms: Negative for: Chronic or frequent coughs; Shortness of Breath Cardiovascular Complaints and Symptoms: Negative for: Chest pain; LE edema Medical History: Positive for: Hypertension; Peripheral Venous Disease Negative for: Angina; Arrhythmia; Congestive Heart Failure; Coronary Artery Disease; Deep Vein Thrombosis; Hypotension; Myocardial Infarction; Peripheral Arterial Disease; Phlebitis; Vasculitis Past Medical History Notes: artificial valve, hyperlipidemia Gastrointestinal Complaints and Symptoms: Negative for: Frequent diarrhea; Nausea; Vomiting Medical History: Negative for: Cirrhosis ; Colitis; Crohnos; Hepatitis A;  Hepatitis B; Hepatitis C Past Medical History Notes: scar tissue from multiple surgeries Endocrine Complaints and Symptoms: Negative for: Hepatitis; Thyroid disease; Polydypsia (Excessive Thirst) Moten, [name] ([id number]) Medical History: Positive for: Type II Diabetes Negative for: Type I Diabetes Treated with: Insulin Blood sugar tested every day: Yes Tested : Genitourinary Complaints and Symptoms: Positive for: Kidney failure/ Dialysis Medical History: 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 Immunological Complaints and Symptoms: Negative for: Hives; Itching Medical History: Negative for: Lupus Erythematosus; Raynaudos; Scleroderma Integumentary (Skin) Complaints and Symptoms: Positive for: Wounds Negative for: Bleeding or bruising tendency; Breakdown; Swelling Medical History: Positive for: History of pressure wounds Negative for: History of Burn Musculoskeletal Complaints and Symptoms: Negative for: Muscle Pain; Muscle Weakness Medical History: Positive for: Gout Negative for: Rheumatoid Arthritis; Osteoarthritis; Osteomyelitis Psychiatric Complaints and Symptoms: Negative for: Anxiety; Claustrophobia Medical History: Negative for: Anorexia/bulimia; Confinement Anxiety Ear/Nose/Mouth/Throat  Medical History: Negative for: Chronic sinus problems/congestion; Middle ear problems Neurologic Medical History: Positive for: Neuropathy Landry, [name] ([id number]) Negative for: Dementia; Quadriplegia; Paraplegia; Seizure Disorder Oncologic Medical History: Negative for: Received Chemotherapy; Received Radiation Immunizations Pneumococcal Vaccine: Received Pneumococcal Vaccination: Yes Immunization Notes: uo to date Implantable Devices None Hospitalization / Surgery History Type of Hospitalization/Surgery  Family and Social History Cancer: No; Diabetes: Yes - Father,Paternal Grandparents; Heart Disease: Yes - Father; Hereditary  Spherocytosis: No; Hypertension: Yes - Father; Kidney Disease: No; Lung Disease: No; Seizures: No; Stroke: No; Thyroid Problems: No; Tuberculosis: No; Former smoker - quit in 1996; Marital Status - Married; Alcohol Use: Never - quit in 1976; Drug Use: No History; Caffeine Use: Never; Financial Concerns: No; Food, Clothing or Shelter Needs: No; Support System Lacking: No; Transportation Concerns: No Electronic Signature(s) Signed: 04/14/2019 3:27:22 PM By: Army Melia Signed: 04/14/2019 6:14:16 PM By: Worthy Keeler PA-C Entered By: Army Melia on 04/14/2019 13:25:28 Kirsh, [name] ([id number]) -------------------------------------------------------------------------------- SuperBill Details Patient Name: [name]. Date of Service: 04/14/2019 Medical Record Number: [id number] Patient Account Number: [id number] Date of Birth/Sex: [birth year] (78 y.o. M) Treating RN: Montey Hora Primary Care Provider: Tedra Senegal Other Clinician: Referring Provider: Tedra Senegal Treating Provider/Extender: [name], Isaac Dubie Weeks in Treatment: 0 Diagnosis Coding ICD-10 Codes Code Description E83.59 Other disorders of calcium metabolism Z99.2 Dependence on renal dialysis E11.622 Type 2 diabetes mellitus with other skin ulcer Facility Procedures CPT4 Code: 33383291 Description: 99213 - WOUND CARE VISIT-LEV 3 EST PT Modifier: Quantity: 1 Physician Procedures CPT4 Code: 9166060 Description: 04599 - WC PHYS LEVEL 3 - EST PT ICD-10 Diagnosis Description E83.59 Other disorders of calcium metabolism Z99.2 Dependence on renal dialysis E11.622 Type 2 diabetes mellitus with other skin ulcer Modifier: Quantity: 1 Electronic Signature(s) Signed: 04/14/2019 6:14:16 PM By: Worthy Keeler PA-C Entered By: Worthy Keeler on 04/14/2019 17:31:35

## 2019-04-17 DIAGNOSIS — E1129 Type 2 diabetes mellitus with other diabetic kidney complication: Secondary | ICD-10-CM | POA: Diagnosis not present

## 2019-04-17 DIAGNOSIS — E119 Type 2 diabetes mellitus without complications: Secondary | ICD-10-CM | POA: Diagnosis not present

## 2019-04-17 DIAGNOSIS — N2581 Secondary hyperparathyroidism of renal origin: Secondary | ICD-10-CM | POA: Diagnosis not present

## 2019-04-17 DIAGNOSIS — N186 End stage renal disease: Secondary | ICD-10-CM | POA: Diagnosis not present

## 2019-04-17 DIAGNOSIS — D509 Iron deficiency anemia, unspecified: Secondary | ICD-10-CM | POA: Diagnosis not present

## 2019-04-20 DIAGNOSIS — E119 Type 2 diabetes mellitus without complications: Secondary | ICD-10-CM | POA: Diagnosis not present

## 2019-04-20 DIAGNOSIS — E1129 Type 2 diabetes mellitus with other diabetic kidney complication: Secondary | ICD-10-CM | POA: Diagnosis not present

## 2019-04-20 DIAGNOSIS — N186 End stage renal disease: Secondary | ICD-10-CM | POA: Diagnosis not present

## 2019-04-20 DIAGNOSIS — D509 Iron deficiency anemia, unspecified: Secondary | ICD-10-CM | POA: Diagnosis not present

## 2019-04-20 DIAGNOSIS — N2581 Secondary hyperparathyroidism of renal origin: Secondary | ICD-10-CM | POA: Diagnosis not present

## 2019-04-22 DIAGNOSIS — E1129 Type 2 diabetes mellitus with other diabetic kidney complication: Secondary | ICD-10-CM | POA: Diagnosis not present

## 2019-04-22 DIAGNOSIS — D509 Iron deficiency anemia, unspecified: Secondary | ICD-10-CM | POA: Diagnosis not present

## 2019-04-22 DIAGNOSIS — N186 End stage renal disease: Secondary | ICD-10-CM | POA: Diagnosis not present

## 2019-04-22 DIAGNOSIS — E119 Type 2 diabetes mellitus without complications: Secondary | ICD-10-CM | POA: Diagnosis not present

## 2019-04-22 DIAGNOSIS — N2581 Secondary hyperparathyroidism of renal origin: Secondary | ICD-10-CM | POA: Diagnosis not present

## 2019-04-24 DIAGNOSIS — D509 Iron deficiency anemia, unspecified: Secondary | ICD-10-CM | POA: Diagnosis not present

## 2019-04-24 DIAGNOSIS — N2581 Secondary hyperparathyroidism of renal origin: Secondary | ICD-10-CM | POA: Diagnosis not present

## 2019-04-24 DIAGNOSIS — E1129 Type 2 diabetes mellitus with other diabetic kidney complication: Secondary | ICD-10-CM | POA: Diagnosis not present

## 2019-04-24 DIAGNOSIS — N186 End stage renal disease: Secondary | ICD-10-CM | POA: Diagnosis not present

## 2019-04-24 DIAGNOSIS — E119 Type 2 diabetes mellitus without complications: Secondary | ICD-10-CM | POA: Diagnosis not present

## 2019-04-24 NOTE — Patient Instructions (Signed)
It was a pleasure to see you today.  Continue current medications.  Watch diet.  Follow-up in 6 months.

## 2019-04-25 DIAGNOSIS — Z992 Dependence on renal dialysis: Secondary | ICD-10-CM | POA: Diagnosis not present

## 2019-04-25 DIAGNOSIS — N186 End stage renal disease: Secondary | ICD-10-CM | POA: Diagnosis not present

## 2019-04-25 DIAGNOSIS — E1122 Type 2 diabetes mellitus with diabetic chronic kidney disease: Secondary | ICD-10-CM | POA: Diagnosis not present

## 2019-04-27 DIAGNOSIS — E119 Type 2 diabetes mellitus without complications: Secondary | ICD-10-CM | POA: Diagnosis not present

## 2019-04-27 DIAGNOSIS — E1129 Type 2 diabetes mellitus with other diabetic kidney complication: Secondary | ICD-10-CM | POA: Diagnosis not present

## 2019-04-27 DIAGNOSIS — N186 End stage renal disease: Secondary | ICD-10-CM | POA: Diagnosis not present

## 2019-04-27 DIAGNOSIS — Z992 Dependence on renal dialysis: Secondary | ICD-10-CM | POA: Diagnosis not present

## 2019-04-27 DIAGNOSIS — N2581 Secondary hyperparathyroidism of renal origin: Secondary | ICD-10-CM | POA: Diagnosis not present

## 2019-04-27 DIAGNOSIS — D509 Iron deficiency anemia, unspecified: Secondary | ICD-10-CM | POA: Diagnosis not present

## 2019-04-27 DIAGNOSIS — D631 Anemia in chronic kidney disease: Secondary | ICD-10-CM | POA: Diagnosis not present

## 2019-04-29 DIAGNOSIS — D509 Iron deficiency anemia, unspecified: Secondary | ICD-10-CM | POA: Diagnosis not present

## 2019-04-29 DIAGNOSIS — N186 End stage renal disease: Secondary | ICD-10-CM | POA: Diagnosis not present

## 2019-04-29 DIAGNOSIS — N2581 Secondary hyperparathyroidism of renal origin: Secondary | ICD-10-CM | POA: Diagnosis not present

## 2019-04-29 DIAGNOSIS — Z992 Dependence on renal dialysis: Secondary | ICD-10-CM | POA: Diagnosis not present

## 2019-04-29 DIAGNOSIS — E119 Type 2 diabetes mellitus without complications: Secondary | ICD-10-CM | POA: Diagnosis not present

## 2019-04-29 DIAGNOSIS — E1129 Type 2 diabetes mellitus with other diabetic kidney complication: Secondary | ICD-10-CM | POA: Diagnosis not present

## 2019-05-01 DIAGNOSIS — Z992 Dependence on renal dialysis: Secondary | ICD-10-CM | POA: Diagnosis not present

## 2019-05-01 DIAGNOSIS — E1129 Type 2 diabetes mellitus with other diabetic kidney complication: Secondary | ICD-10-CM | POA: Diagnosis not present

## 2019-05-01 DIAGNOSIS — D509 Iron deficiency anemia, unspecified: Secondary | ICD-10-CM | POA: Diagnosis not present

## 2019-05-01 DIAGNOSIS — N186 End stage renal disease: Secondary | ICD-10-CM | POA: Diagnosis not present

## 2019-05-01 DIAGNOSIS — E119 Type 2 diabetes mellitus without complications: Secondary | ICD-10-CM | POA: Diagnosis not present

## 2019-05-01 DIAGNOSIS — N2581 Secondary hyperparathyroidism of renal origin: Secondary | ICD-10-CM | POA: Diagnosis not present

## 2019-05-04 DIAGNOSIS — D509 Iron deficiency anemia, unspecified: Secondary | ICD-10-CM | POA: Diagnosis not present

## 2019-05-04 DIAGNOSIS — E1129 Type 2 diabetes mellitus with other diabetic kidney complication: Secondary | ICD-10-CM | POA: Diagnosis not present

## 2019-05-04 DIAGNOSIS — E119 Type 2 diabetes mellitus without complications: Secondary | ICD-10-CM | POA: Diagnosis not present

## 2019-05-04 DIAGNOSIS — N186 End stage renal disease: Secondary | ICD-10-CM | POA: Diagnosis not present

## 2019-05-04 DIAGNOSIS — N2581 Secondary hyperparathyroidism of renal origin: Secondary | ICD-10-CM | POA: Diagnosis not present

## 2019-05-04 DIAGNOSIS — Z992 Dependence on renal dialysis: Secondary | ICD-10-CM | POA: Diagnosis not present

## 2019-05-06 DIAGNOSIS — D509 Iron deficiency anemia, unspecified: Secondary | ICD-10-CM | POA: Diagnosis not present

## 2019-05-06 DIAGNOSIS — N2581 Secondary hyperparathyroidism of renal origin: Secondary | ICD-10-CM | POA: Diagnosis not present

## 2019-05-06 DIAGNOSIS — Z992 Dependence on renal dialysis: Secondary | ICD-10-CM | POA: Diagnosis not present

## 2019-05-06 DIAGNOSIS — E1129 Type 2 diabetes mellitus with other diabetic kidney complication: Secondary | ICD-10-CM | POA: Diagnosis not present

## 2019-05-06 DIAGNOSIS — E119 Type 2 diabetes mellitus without complications: Secondary | ICD-10-CM | POA: Diagnosis not present

## 2019-05-06 DIAGNOSIS — N186 End stage renal disease: Secondary | ICD-10-CM | POA: Diagnosis not present

## 2019-05-08 DIAGNOSIS — E119 Type 2 diabetes mellitus without complications: Secondary | ICD-10-CM | POA: Diagnosis not present

## 2019-05-08 DIAGNOSIS — N2581 Secondary hyperparathyroidism of renal origin: Secondary | ICD-10-CM | POA: Diagnosis not present

## 2019-05-08 DIAGNOSIS — N186 End stage renal disease: Secondary | ICD-10-CM | POA: Diagnosis not present

## 2019-05-08 DIAGNOSIS — Z992 Dependence on renal dialysis: Secondary | ICD-10-CM | POA: Diagnosis not present

## 2019-05-08 DIAGNOSIS — E1129 Type 2 diabetes mellitus with other diabetic kidney complication: Secondary | ICD-10-CM | POA: Diagnosis not present

## 2019-05-08 DIAGNOSIS — D509 Iron deficiency anemia, unspecified: Secondary | ICD-10-CM | POA: Diagnosis not present

## 2019-05-11 DIAGNOSIS — D509 Iron deficiency anemia, unspecified: Secondary | ICD-10-CM | POA: Diagnosis not present

## 2019-05-11 DIAGNOSIS — E1129 Type 2 diabetes mellitus with other diabetic kidney complication: Secondary | ICD-10-CM | POA: Diagnosis not present

## 2019-05-11 DIAGNOSIS — N2581 Secondary hyperparathyroidism of renal origin: Secondary | ICD-10-CM | POA: Diagnosis not present

## 2019-05-11 DIAGNOSIS — E119 Type 2 diabetes mellitus without complications: Secondary | ICD-10-CM | POA: Diagnosis not present

## 2019-05-11 DIAGNOSIS — N186 End stage renal disease: Secondary | ICD-10-CM | POA: Diagnosis not present

## 2019-05-11 DIAGNOSIS — Z992 Dependence on renal dialysis: Secondary | ICD-10-CM | POA: Diagnosis not present

## 2019-05-13 DIAGNOSIS — Z992 Dependence on renal dialysis: Secondary | ICD-10-CM | POA: Diagnosis not present

## 2019-05-13 DIAGNOSIS — E1129 Type 2 diabetes mellitus with other diabetic kidney complication: Secondary | ICD-10-CM | POA: Diagnosis not present

## 2019-05-13 DIAGNOSIS — D509 Iron deficiency anemia, unspecified: Secondary | ICD-10-CM | POA: Diagnosis not present

## 2019-05-13 DIAGNOSIS — E119 Type 2 diabetes mellitus without complications: Secondary | ICD-10-CM | POA: Diagnosis not present

## 2019-05-13 DIAGNOSIS — N2581 Secondary hyperparathyroidism of renal origin: Secondary | ICD-10-CM | POA: Diagnosis not present

## 2019-05-13 DIAGNOSIS — N186 End stage renal disease: Secondary | ICD-10-CM | POA: Diagnosis not present

## 2019-05-15 DIAGNOSIS — E119 Type 2 diabetes mellitus without complications: Secondary | ICD-10-CM | POA: Diagnosis not present

## 2019-05-15 DIAGNOSIS — E1129 Type 2 diabetes mellitus with other diabetic kidney complication: Secondary | ICD-10-CM | POA: Diagnosis not present

## 2019-05-15 DIAGNOSIS — Z992 Dependence on renal dialysis: Secondary | ICD-10-CM | POA: Diagnosis not present

## 2019-05-15 DIAGNOSIS — N186 End stage renal disease: Secondary | ICD-10-CM | POA: Diagnosis not present

## 2019-05-15 DIAGNOSIS — D509 Iron deficiency anemia, unspecified: Secondary | ICD-10-CM | POA: Diagnosis not present

## 2019-05-15 DIAGNOSIS — N2581 Secondary hyperparathyroidism of renal origin: Secondary | ICD-10-CM | POA: Diagnosis not present

## 2019-05-18 DIAGNOSIS — N2581 Secondary hyperparathyroidism of renal origin: Secondary | ICD-10-CM | POA: Diagnosis not present

## 2019-05-18 DIAGNOSIS — E119 Type 2 diabetes mellitus without complications: Secondary | ICD-10-CM | POA: Diagnosis not present

## 2019-05-18 DIAGNOSIS — N186 End stage renal disease: Secondary | ICD-10-CM | POA: Diagnosis not present

## 2019-05-18 DIAGNOSIS — Z992 Dependence on renal dialysis: Secondary | ICD-10-CM | POA: Diagnosis not present

## 2019-05-18 DIAGNOSIS — E1129 Type 2 diabetes mellitus with other diabetic kidney complication: Secondary | ICD-10-CM | POA: Diagnosis not present

## 2019-05-18 DIAGNOSIS — D509 Iron deficiency anemia, unspecified: Secondary | ICD-10-CM | POA: Diagnosis not present

## 2019-05-20 DIAGNOSIS — D509 Iron deficiency anemia, unspecified: Secondary | ICD-10-CM | POA: Diagnosis not present

## 2019-05-20 DIAGNOSIS — Z992 Dependence on renal dialysis: Secondary | ICD-10-CM | POA: Diagnosis not present

## 2019-05-20 DIAGNOSIS — N186 End stage renal disease: Secondary | ICD-10-CM | POA: Diagnosis not present

## 2019-05-20 DIAGNOSIS — N2581 Secondary hyperparathyroidism of renal origin: Secondary | ICD-10-CM | POA: Diagnosis not present

## 2019-05-20 DIAGNOSIS — E119 Type 2 diabetes mellitus without complications: Secondary | ICD-10-CM | POA: Diagnosis not present

## 2019-05-20 DIAGNOSIS — E1129 Type 2 diabetes mellitus with other diabetic kidney complication: Secondary | ICD-10-CM | POA: Diagnosis not present

## 2019-05-22 DIAGNOSIS — N2581 Secondary hyperparathyroidism of renal origin: Secondary | ICD-10-CM | POA: Diagnosis not present

## 2019-05-22 DIAGNOSIS — E119 Type 2 diabetes mellitus without complications: Secondary | ICD-10-CM | POA: Diagnosis not present

## 2019-05-22 DIAGNOSIS — D509 Iron deficiency anemia, unspecified: Secondary | ICD-10-CM | POA: Diagnosis not present

## 2019-05-22 DIAGNOSIS — N186 End stage renal disease: Secondary | ICD-10-CM | POA: Diagnosis not present

## 2019-05-22 DIAGNOSIS — E1129 Type 2 diabetes mellitus with other diabetic kidney complication: Secondary | ICD-10-CM | POA: Diagnosis not present

## 2019-05-22 DIAGNOSIS — Z992 Dependence on renal dialysis: Secondary | ICD-10-CM | POA: Diagnosis not present

## 2019-05-25 ENCOUNTER — Other Ambulatory Visit: Payer: Self-pay | Admitting: Endocrinology

## 2019-05-25 DIAGNOSIS — E1129 Type 2 diabetes mellitus with other diabetic kidney complication: Secondary | ICD-10-CM | POA: Diagnosis not present

## 2019-05-25 DIAGNOSIS — N2581 Secondary hyperparathyroidism of renal origin: Secondary | ICD-10-CM | POA: Diagnosis not present

## 2019-05-25 DIAGNOSIS — D509 Iron deficiency anemia, unspecified: Secondary | ICD-10-CM | POA: Diagnosis not present

## 2019-05-25 DIAGNOSIS — E119 Type 2 diabetes mellitus without complications: Secondary | ICD-10-CM | POA: Diagnosis not present

## 2019-05-25 DIAGNOSIS — N186 End stage renal disease: Secondary | ICD-10-CM | POA: Diagnosis not present

## 2019-05-25 DIAGNOSIS — Z992 Dependence on renal dialysis: Secondary | ICD-10-CM | POA: Diagnosis not present

## 2019-05-26 DIAGNOSIS — N186 End stage renal disease: Secondary | ICD-10-CM | POA: Diagnosis not present

## 2019-05-26 DIAGNOSIS — E1122 Type 2 diabetes mellitus with diabetic chronic kidney disease: Secondary | ICD-10-CM | POA: Diagnosis not present

## 2019-05-26 DIAGNOSIS — Z992 Dependence on renal dialysis: Secondary | ICD-10-CM | POA: Diagnosis not present

## 2019-05-27 DIAGNOSIS — N186 End stage renal disease: Secondary | ICD-10-CM | POA: Diagnosis not present

## 2019-05-27 DIAGNOSIS — N2581 Secondary hyperparathyroidism of renal origin: Secondary | ICD-10-CM | POA: Diagnosis not present

## 2019-05-27 DIAGNOSIS — Z992 Dependence on renal dialysis: Secondary | ICD-10-CM | POA: Diagnosis not present

## 2019-05-27 DIAGNOSIS — E1129 Type 2 diabetes mellitus with other diabetic kidney complication: Secondary | ICD-10-CM | POA: Diagnosis not present

## 2019-05-27 DIAGNOSIS — E119 Type 2 diabetes mellitus without complications: Secondary | ICD-10-CM | POA: Diagnosis not present

## 2019-05-27 DIAGNOSIS — Z23 Encounter for immunization: Secondary | ICD-10-CM | POA: Diagnosis not present

## 2019-05-29 DIAGNOSIS — Z992 Dependence on renal dialysis: Secondary | ICD-10-CM | POA: Diagnosis not present

## 2019-05-29 DIAGNOSIS — N2581 Secondary hyperparathyroidism of renal origin: Secondary | ICD-10-CM | POA: Diagnosis not present

## 2019-05-29 DIAGNOSIS — E1129 Type 2 diabetes mellitus with other diabetic kidney complication: Secondary | ICD-10-CM | POA: Diagnosis not present

## 2019-05-29 DIAGNOSIS — N186 End stage renal disease: Secondary | ICD-10-CM | POA: Diagnosis not present

## 2019-05-29 DIAGNOSIS — E119 Type 2 diabetes mellitus without complications: Secondary | ICD-10-CM | POA: Diagnosis not present

## 2019-06-01 DIAGNOSIS — Z992 Dependence on renal dialysis: Secondary | ICD-10-CM | POA: Diagnosis not present

## 2019-06-01 DIAGNOSIS — E119 Type 2 diabetes mellitus without complications: Secondary | ICD-10-CM | POA: Diagnosis not present

## 2019-06-01 DIAGNOSIS — N2581 Secondary hyperparathyroidism of renal origin: Secondary | ICD-10-CM | POA: Diagnosis not present

## 2019-06-01 DIAGNOSIS — N186 End stage renal disease: Secondary | ICD-10-CM | POA: Diagnosis not present

## 2019-06-01 DIAGNOSIS — E1129 Type 2 diabetes mellitus with other diabetic kidney complication: Secondary | ICD-10-CM | POA: Diagnosis not present

## 2019-06-03 DIAGNOSIS — Z992 Dependence on renal dialysis: Secondary | ICD-10-CM | POA: Diagnosis not present

## 2019-06-03 DIAGNOSIS — N2581 Secondary hyperparathyroidism of renal origin: Secondary | ICD-10-CM | POA: Diagnosis not present

## 2019-06-03 DIAGNOSIS — E1129 Type 2 diabetes mellitus with other diabetic kidney complication: Secondary | ICD-10-CM | POA: Diagnosis not present

## 2019-06-03 DIAGNOSIS — E119 Type 2 diabetes mellitus without complications: Secondary | ICD-10-CM | POA: Diagnosis not present

## 2019-06-03 DIAGNOSIS — N186 End stage renal disease: Secondary | ICD-10-CM | POA: Diagnosis not present

## 2019-06-05 DIAGNOSIS — E119 Type 2 diabetes mellitus without complications: Secondary | ICD-10-CM | POA: Diagnosis not present

## 2019-06-05 DIAGNOSIS — N2581 Secondary hyperparathyroidism of renal origin: Secondary | ICD-10-CM | POA: Diagnosis not present

## 2019-06-05 DIAGNOSIS — N186 End stage renal disease: Secondary | ICD-10-CM | POA: Diagnosis not present

## 2019-06-05 DIAGNOSIS — Z992 Dependence on renal dialysis: Secondary | ICD-10-CM | POA: Diagnosis not present

## 2019-06-05 DIAGNOSIS — E1129 Type 2 diabetes mellitus with other diabetic kidney complication: Secondary | ICD-10-CM | POA: Diagnosis not present

## 2019-06-08 DIAGNOSIS — E119 Type 2 diabetes mellitus without complications: Secondary | ICD-10-CM | POA: Diagnosis not present

## 2019-06-08 DIAGNOSIS — E1129 Type 2 diabetes mellitus with other diabetic kidney complication: Secondary | ICD-10-CM | POA: Diagnosis not present

## 2019-06-08 DIAGNOSIS — Z992 Dependence on renal dialysis: Secondary | ICD-10-CM | POA: Diagnosis not present

## 2019-06-08 DIAGNOSIS — N186 End stage renal disease: Secondary | ICD-10-CM | POA: Diagnosis not present

## 2019-06-08 DIAGNOSIS — N2581 Secondary hyperparathyroidism of renal origin: Secondary | ICD-10-CM | POA: Diagnosis not present

## 2019-06-10 DIAGNOSIS — E119 Type 2 diabetes mellitus without complications: Secondary | ICD-10-CM | POA: Diagnosis not present

## 2019-06-10 DIAGNOSIS — Z992 Dependence on renal dialysis: Secondary | ICD-10-CM | POA: Diagnosis not present

## 2019-06-10 DIAGNOSIS — N186 End stage renal disease: Secondary | ICD-10-CM | POA: Diagnosis not present

## 2019-06-10 DIAGNOSIS — N2581 Secondary hyperparathyroidism of renal origin: Secondary | ICD-10-CM | POA: Diagnosis not present

## 2019-06-10 DIAGNOSIS — E1129 Type 2 diabetes mellitus with other diabetic kidney complication: Secondary | ICD-10-CM | POA: Diagnosis not present

## 2019-06-12 DIAGNOSIS — N2581 Secondary hyperparathyroidism of renal origin: Secondary | ICD-10-CM | POA: Diagnosis not present

## 2019-06-12 DIAGNOSIS — E1129 Type 2 diabetes mellitus with other diabetic kidney complication: Secondary | ICD-10-CM | POA: Diagnosis not present

## 2019-06-12 DIAGNOSIS — E119 Type 2 diabetes mellitus without complications: Secondary | ICD-10-CM | POA: Diagnosis not present

## 2019-06-12 DIAGNOSIS — Z992 Dependence on renal dialysis: Secondary | ICD-10-CM | POA: Diagnosis not present

## 2019-06-12 DIAGNOSIS — N186 End stage renal disease: Secondary | ICD-10-CM | POA: Diagnosis not present

## 2019-06-15 DIAGNOSIS — N2581 Secondary hyperparathyroidism of renal origin: Secondary | ICD-10-CM | POA: Diagnosis not present

## 2019-06-15 DIAGNOSIS — N186 End stage renal disease: Secondary | ICD-10-CM | POA: Diagnosis not present

## 2019-06-15 DIAGNOSIS — E1129 Type 2 diabetes mellitus with other diabetic kidney complication: Secondary | ICD-10-CM | POA: Diagnosis not present

## 2019-06-15 DIAGNOSIS — Z992 Dependence on renal dialysis: Secondary | ICD-10-CM | POA: Diagnosis not present

## 2019-06-15 DIAGNOSIS — E119 Type 2 diabetes mellitus without complications: Secondary | ICD-10-CM | POA: Diagnosis not present

## 2019-06-16 ENCOUNTER — Other Ambulatory Visit (INDEPENDENT_AMBULATORY_CARE_PROVIDER_SITE_OTHER): Payer: Medicare Other

## 2019-06-16 ENCOUNTER — Other Ambulatory Visit: Payer: Self-pay

## 2019-06-16 DIAGNOSIS — E1165 Type 2 diabetes mellitus with hyperglycemia: Secondary | ICD-10-CM

## 2019-06-16 LAB — RENAL FUNCTION PANEL
Albumin: 4.1 g/dL (ref 3.5–5.2)
BUN: 43 mg/dL — ABNORMAL HIGH (ref 6–23)
CO2: 31 mEq/L (ref 19–32)
Calcium: 10.6 mg/dL — ABNORMAL HIGH (ref 8.4–10.5)
Chloride: 93 mEq/L — ABNORMAL LOW (ref 96–112)
Creatinine, Ser: 7.2 mg/dL (ref 0.40–1.50)
GFR: 8.96 mL/min — CL (ref >60.00)
Glucose, Bld: 118 mg/dL — ABNORMAL HIGH (ref 70–99)
Phosphorus: 4 mg/dL (ref 2.3–4.6)
Potassium: 4.4 mEq/L (ref 3.5–5.1)
Sodium: 137 mEq/L (ref 135–145)

## 2019-06-16 LAB — HEMOGLOBIN A1C: Hgb A1c MFr Bld: 6.6 % — ABNORMAL HIGH (ref 4.6–6.5)

## 2019-06-16 LAB — GLUCOSE, RANDOM: Glucose, Bld: 118 mg/dL — ABNORMAL HIGH (ref 70–99)

## 2019-06-17 DIAGNOSIS — Z992 Dependence on renal dialysis: Secondary | ICD-10-CM | POA: Diagnosis not present

## 2019-06-17 DIAGNOSIS — E119 Type 2 diabetes mellitus without complications: Secondary | ICD-10-CM | POA: Diagnosis not present

## 2019-06-17 DIAGNOSIS — N186 End stage renal disease: Secondary | ICD-10-CM | POA: Diagnosis not present

## 2019-06-17 DIAGNOSIS — N2581 Secondary hyperparathyroidism of renal origin: Secondary | ICD-10-CM | POA: Diagnosis not present

## 2019-06-17 DIAGNOSIS — E1129 Type 2 diabetes mellitus with other diabetic kidney complication: Secondary | ICD-10-CM | POA: Diagnosis not present

## 2019-06-17 LAB — FRUCTOSAMINE: Fructosamine: 335 umol/L — ABNORMAL HIGH (ref 0–285)

## 2019-06-18 ENCOUNTER — Ambulatory Visit: Payer: Medicare Other | Admitting: Endocrinology

## 2019-06-19 DIAGNOSIS — Z992 Dependence on renal dialysis: Secondary | ICD-10-CM | POA: Diagnosis not present

## 2019-06-19 DIAGNOSIS — N186 End stage renal disease: Secondary | ICD-10-CM | POA: Diagnosis not present

## 2019-06-19 DIAGNOSIS — E119 Type 2 diabetes mellitus without complications: Secondary | ICD-10-CM | POA: Diagnosis not present

## 2019-06-19 DIAGNOSIS — E1129 Type 2 diabetes mellitus with other diabetic kidney complication: Secondary | ICD-10-CM | POA: Diagnosis not present

## 2019-06-19 DIAGNOSIS — N2581 Secondary hyperparathyroidism of renal origin: Secondary | ICD-10-CM | POA: Diagnosis not present

## 2019-06-22 ENCOUNTER — Other Ambulatory Visit: Payer: Self-pay

## 2019-06-22 ENCOUNTER — Telehealth: Payer: Self-pay | Admitting: Endocrinology

## 2019-06-22 DIAGNOSIS — N2581 Secondary hyperparathyroidism of renal origin: Secondary | ICD-10-CM | POA: Diagnosis not present

## 2019-06-22 DIAGNOSIS — E119 Type 2 diabetes mellitus without complications: Secondary | ICD-10-CM | POA: Diagnosis not present

## 2019-06-22 DIAGNOSIS — E1129 Type 2 diabetes mellitus with other diabetic kidney complication: Secondary | ICD-10-CM | POA: Diagnosis not present

## 2019-06-22 DIAGNOSIS — Z992 Dependence on renal dialysis: Secondary | ICD-10-CM | POA: Diagnosis not present

## 2019-06-22 DIAGNOSIS — N186 End stage renal disease: Secondary | ICD-10-CM | POA: Diagnosis not present

## 2019-06-22 MED ORDER — TRADJENTA 5 MG PO TABS: 5.0000 mg | ORAL_TABLET | Freq: Every day | ORAL | 2 refills | Status: DC

## 2019-06-22 NOTE — Telephone Encounter (Signed)
MEDICATION: TRADJENTA 5 MG TABS tablet  PHARMACY:  [facility]-Ph# [phone]  IS THIS A 90 DAY SUPPLY : Yes  IS PATIENT OUT OF MEDICATION: No  IF NOT; HOW MUCH IS LEFT: approx. 10 days  LAST APPOINTMENT DATE: @8 /31/2020  NEXT APPOINTMENT DATE:@10 /13/2020  DO WE HAVE YOUR PERMISSION TO LEAVE A DETAILED MESSAGE: Yes  OTHER COMMENTS: Per patient's wife [facility] sent request for the above Rx to Dr. Dwyane Dee but have not heard anything back to date   **Let patient know to contact pharmacy at the end of the day to make sure medication is ready. **  ** Please notify patient to allow 48-72 hours to process**  **Encourage patient to contact the pharmacy for refills or they can request refills through [facility]**

## 2019-06-22 NOTE — Telephone Encounter (Signed)
Rx sent 

## 2019-06-23 DIAGNOSIS — L738 Other specified follicular disorders: Secondary | ICD-10-CM | POA: Diagnosis not present

## 2019-06-23 DIAGNOSIS — L218 Other seborrheic dermatitis: Secondary | ICD-10-CM | POA: Diagnosis not present

## 2019-06-24 DIAGNOSIS — E119 Type 2 diabetes mellitus without complications: Secondary | ICD-10-CM | POA: Diagnosis not present

## 2019-06-24 DIAGNOSIS — Z992 Dependence on renal dialysis: Secondary | ICD-10-CM | POA: Diagnosis not present

## 2019-06-24 DIAGNOSIS — N2581 Secondary hyperparathyroidism of renal origin: Secondary | ICD-10-CM | POA: Diagnosis not present

## 2019-06-24 DIAGNOSIS — E1129 Type 2 diabetes mellitus with other diabetic kidney complication: Secondary | ICD-10-CM | POA: Diagnosis not present

## 2019-06-24 DIAGNOSIS — N186 End stage renal disease: Secondary | ICD-10-CM | POA: Diagnosis not present

## 2019-06-25 DIAGNOSIS — Z992 Dependence on renal dialysis: Secondary | ICD-10-CM | POA: Diagnosis not present

## 2019-06-25 DIAGNOSIS — N186 End stage renal disease: Secondary | ICD-10-CM | POA: Diagnosis not present

## 2019-06-25 DIAGNOSIS — E1122 Type 2 diabetes mellitus with diabetic chronic kidney disease: Secondary | ICD-10-CM | POA: Diagnosis not present

## 2019-06-26 DIAGNOSIS — D509 Iron deficiency anemia, unspecified: Secondary | ICD-10-CM | POA: Diagnosis not present

## 2019-06-26 DIAGNOSIS — E119 Type 2 diabetes mellitus without complications: Secondary | ICD-10-CM | POA: Diagnosis not present

## 2019-06-26 DIAGNOSIS — N2581 Secondary hyperparathyroidism of renal origin: Secondary | ICD-10-CM | POA: Diagnosis not present

## 2019-06-26 DIAGNOSIS — E1129 Type 2 diabetes mellitus with other diabetic kidney complication: Secondary | ICD-10-CM | POA: Diagnosis not present

## 2019-06-26 DIAGNOSIS — D631 Anemia in chronic kidney disease: Secondary | ICD-10-CM | POA: Diagnosis not present

## 2019-06-26 DIAGNOSIS — Z992 Dependence on renal dialysis: Secondary | ICD-10-CM | POA: Diagnosis not present

## 2019-06-26 DIAGNOSIS — N186 End stage renal disease: Secondary | ICD-10-CM | POA: Diagnosis not present

## 2019-06-29 DIAGNOSIS — N186 End stage renal disease: Secondary | ICD-10-CM | POA: Diagnosis not present

## 2019-06-29 DIAGNOSIS — Z992 Dependence on renal dialysis: Secondary | ICD-10-CM | POA: Diagnosis not present

## 2019-06-29 DIAGNOSIS — N2581 Secondary hyperparathyroidism of renal origin: Secondary | ICD-10-CM | POA: Diagnosis not present

## 2019-06-29 DIAGNOSIS — D509 Iron deficiency anemia, unspecified: Secondary | ICD-10-CM | POA: Diagnosis not present

## 2019-06-29 DIAGNOSIS — E1129 Type 2 diabetes mellitus with other diabetic kidney complication: Secondary | ICD-10-CM | POA: Diagnosis not present

## 2019-07-01 DIAGNOSIS — Z992 Dependence on renal dialysis: Secondary | ICD-10-CM | POA: Diagnosis not present

## 2019-07-01 DIAGNOSIS — D509 Iron deficiency anemia, unspecified: Secondary | ICD-10-CM | POA: Diagnosis not present

## 2019-07-01 DIAGNOSIS — E1129 Type 2 diabetes mellitus with other diabetic kidney complication: Secondary | ICD-10-CM | POA: Diagnosis not present

## 2019-07-01 DIAGNOSIS — N186 End stage renal disease: Secondary | ICD-10-CM | POA: Diagnosis not present

## 2019-07-01 DIAGNOSIS — N2581 Secondary hyperparathyroidism of renal origin: Secondary | ICD-10-CM | POA: Diagnosis not present

## 2019-07-03 DIAGNOSIS — E1129 Type 2 diabetes mellitus with other diabetic kidney complication: Secondary | ICD-10-CM | POA: Diagnosis not present

## 2019-07-03 DIAGNOSIS — Z992 Dependence on renal dialysis: Secondary | ICD-10-CM | POA: Diagnosis not present

## 2019-07-03 DIAGNOSIS — N2581 Secondary hyperparathyroidism of renal origin: Secondary | ICD-10-CM | POA: Diagnosis not present

## 2019-07-03 DIAGNOSIS — D509 Iron deficiency anemia, unspecified: Secondary | ICD-10-CM | POA: Diagnosis not present

## 2019-07-03 DIAGNOSIS — N186 End stage renal disease: Secondary | ICD-10-CM | POA: Diagnosis not present

## 2019-07-06 DIAGNOSIS — D509 Iron deficiency anemia, unspecified: Secondary | ICD-10-CM | POA: Diagnosis not present

## 2019-07-06 DIAGNOSIS — Z992 Dependence on renal dialysis: Secondary | ICD-10-CM | POA: Diagnosis not present

## 2019-07-06 DIAGNOSIS — N2581 Secondary hyperparathyroidism of renal origin: Secondary | ICD-10-CM | POA: Diagnosis not present

## 2019-07-06 DIAGNOSIS — N186 End stage renal disease: Secondary | ICD-10-CM | POA: Diagnosis not present

## 2019-07-06 DIAGNOSIS — E1129 Type 2 diabetes mellitus with other diabetic kidney complication: Secondary | ICD-10-CM | POA: Diagnosis not present

## 2019-07-07 ENCOUNTER — Encounter: Payer: Self-pay | Admitting: Endocrinology

## 2019-07-07 ENCOUNTER — Other Ambulatory Visit: Payer: Self-pay

## 2019-07-07 ENCOUNTER — Ambulatory Visit (INDEPENDENT_AMBULATORY_CARE_PROVIDER_SITE_OTHER): Payer: Medicare Other | Admitting: Endocrinology

## 2019-07-07 VITALS — BP 124/78 | HR 67 | Ht 67.0 in | Wt 167.8 lb

## 2019-07-07 DIAGNOSIS — E1165 Type 2 diabetes mellitus with hyperglycemia: Secondary | ICD-10-CM

## 2019-07-07 DIAGNOSIS — Z794 Long term (current) use of insulin: Secondary | ICD-10-CM

## 2019-07-07 DIAGNOSIS — E039 Hypothyroidism, unspecified: Secondary | ICD-10-CM | POA: Diagnosis not present

## 2019-07-07 NOTE — Progress Notes (Signed)
Patient ID: [name], male   DOB: 04-Oct-1940, 78 y.o.   MRN: [id number]           Reason for Appointment:  Follow-up for endocrinology problems   History of Present Illness:          Date of diagnosis of type 2 diabetes mellitus:  1996      Background history:  [name] has had long-standing diabetes probably treated with metformin initially and subsequently with sulfonylurea drugs At some point [name] was also given Actos in addition to his glipizide which [name] is still taking Records of his control are only available for the last 4 years not on Over the last few years his A1c has been generally in the 6-7% range His A1c was 6.2 in May 2016 and apparently was starting to get low normal blood sugars at times [name] was admitted to the hospital for fluid overload and renal failure and at that time his Actos was stopped Subsequently his blood sugars had been markedly increased [name] was switched from regular insulin to NovoLog on his initial consultation  Recent history:   INSULIN regimen: NovoLog 5 units pc breakfast and 4-6 units before dinner Oral hypoglycemic drugs: Tradjenta 5 mg daily  His A1c tends to be relatively low but is now 6.6 compared to 5.5  Fructosamine in relatively higher at 335 previously was better at 292  Current blood sugar patterns and problems identified:  [name] apparently is only eating 2 meals a day on most days although occasionally will have lunch on nondialysis days also  Usually his wife is in charge of his insulin regimen  For some reason sometimes [name] gives [name] the insulin in the morning 30 minutes after eating and she does not understand the need to take it before eating  Also mostly adjusting the mealtime dose based on premeal blood sugar rather than what [name] is eating  Last night even without testing blood sugar she gave [name] another 4 units at bedtime since before dinner reading was 220  No hypoglycemia  As before his fasting blood sugars are normal without  any basal insulin  [name] does not do readings after breakfast but only late afternoon before dinner  [name] is only eating peanut butter crackers at dialysis but his blood sugars are still high in the afternoon and as high as 220  If [name] does have lunch on nondialysis days [name] does not take any extra insulin  Blood sugars are fairly good overall compared to premeal readings at night after supper  [name] has gained weight since his last visit  [facility] is wanting to take 6.25 mg of alogliptin as this is the preferred drug instead of Tradjenta   Dialysis is done at 12 pm, doing it on Monday/Wednesday/Fridays     Side effects from medications have been: None  Compliance with the medical regimen: Fair  Glucose monitoring:  done 3  times a day         Glucometer:  Freestyle  Blood Glucose readings by review of monitor: Download results as below   PRE-MEAL Fasting Lunch Dinner Bedtime Overall  Glucose range:  89-127      Mean/median:  110   198   145   POST-MEAL PC Breakfast PC Lunch PC Dinner  Glucose range:  122, 193   129-215  Mean/median:    160   PREVIOUS readings:  PRE-MEAL Fasting Lunch Dinner Bedtime Overall  Glucose range:  87-117   129-269  Mean/median:  100   191  150 +/-48   POST-MEAL PC Breakfast PC Lunch PC Dinner  Glucose range:  192, 224  140-218  71-194  Mean/median:  168  187  158       Self-care: The diet that the patient has been following is: tries to limit sweets, portions. eating out only about once a week at a steak house        Typical meal intake: Breakfast is variable; supper 7-8 pm            Dietician visit, most recent: 08/2017               Exercise:  Not able to do any  Weight history:   Wt Readings from Last 3 Encounters:  07/07/19 167 lb 12.8 oz (76.1 kg)  04/07/19 153 lb (69.4 kg)  03/19/19 156 lb (70.8 kg)    Glycemic control:    Lab Results  Component Value Date   HGBA1C 6.6 (H) 06/16/2019   HGBA1C 5.5 04/02/2019    HGBA1C 5.3 01/15/2019   Lab Results  Component Value Date   MICROALBUR 37.2 09/03/2016   LDLCALC 44 04/02/2019   CREATININE 7.20 (HH) 06/16/2019    Lab Results  Component Value Date   FRUCTOSAMINE 335 (H) 06/16/2019   FRUCTOSAMINE 292 (H) 01/13/2019   FRUCTOSAMINE 344 (H) 11/13/2018    PROBLEM 2 Osteoporosis: See review of systems     Allergies as of 07/07/2019      Reactions   Ambien [zolpidem Tartrate] Other (See Comments)   hallucinations and "felt crazy"    Penicillins Rash   Has patient had a PCN reaction causing immediate rash, facial/tongue/throat swelling, SOB or lightheadedness with hypotension: Yes Has patient had a PCN reaction causing severe rash involving mucus membranes or skin necrosis: Yes Has patient had a PCN reaction that required hospitalization: No Has patient had a PCN reaction occurring within the last 10 years: No If all of the above answers are "NO", then may proceed with Cephalosporin use.      Medication List       Accurate as of July 07, 2019  3:04 PM. If you have any questions, ask your nurse or doctor.        acetaminophen 325 MG tablet Commonly known as: TYLENOL Take 1-2 tablets (325-650 mg total) by mouth every 4 (four) hours as needed for mild pain.   allopurinol 100 MG tablet Commonly known as: ZYLOPRIM Take 100 mg by mouth daily.   Carboxymethylcellulose Sod PF 0.25 % Soln Place 1 drop into both eyes as needed (dry eyes).   clobetasol 0.05 % external solution Commonly known as: TEMOVATE Apply 1 application topically 2 (two) times daily as needed (irritation). Apply to scalp   denosumab 60 MG/ML Sosy injection Commonly known as: PROLIA Inject 60 mg into the skin every 6 (six) months.   docusate sodium 100 MG capsule Commonly known as: COLACE Take 1 capsule (100 mg total) by mouth daily. What changed:   when to take this  reasons to take this   donepezil 10 MG tablet Commonly known as: ARICEPT Take 10 mg by  mouth 2 (two) times a day.   feeding supplement (NEPRO CARB STEADY) Liqd Take 237 mLs by mouth daily. Take 237 mg - 474 ml per day   Fluocinolone Acetonide Scalp 0.01 % Oil Apply 1 application topically at bedtime. Apply to scalp   fluticasone 0.05 % cream Commonly known as: CUTIVATE Apply 1 application topically  2 (two) times daily as needed (irritation). Apply to face   fluticasone 50 MCG/ACT nasal spray Commonly known as: FLONASE Place 1 spray into both nostrils daily. What changed:   when to take this  reasons to take this   FreeStyle Freedom Lite w/Device Kit Use to check blood sugar 2 times per day dx code E11.65   freestyle lancets Use as instructed to check blood sugar 2 times per day dx code E11.65   FREESTYLE LITE test strip Generic drug: glucose blood USE THREE TIMES DAILY   HYDROcodone-acetaminophen 5-325 MG tablet Commonly known as: NORCO/VICODIN Take 1 tablet by mouth every 4 (four) hours as needed.   hydrocortisone 2.5 % cream Apply 1 application topically as needed for after feeds. Legs and arms   hydrOXYzine 25 MG capsule Commonly known as: VISTARIL Take 1 capsule by mouth every 6 (six) hours as needed for itching.   insulin aspart 100 UNIT/ML FlexPen Commonly known as: NovoLOG FlexPen INJECT 6-8 UNITS UNDER THE SKIN THREE TIMES DAILY BEFORE MEALS. DX:E11.65 What changed:   how much to take  how to take this  when to take this  additional instructions   ketoconazole 2 % shampoo Commonly known as: Nizoral Apply 1 application topically 2 (two) times a week. What changed:   when to take this  reasons to take this   levothyroxine 50 MCG tablet Commonly known as: SYNTHROID Take 1 tablet (50 mcg total) by mouth daily.   Men-Phor lotion Generic drug: camphor-menthol Apply 1 application topically daily.   midodrine 10 MG tablet Commonly known as: PROAMATINE Take one pill prior to hemodialysis on MWF What changed:   how much to take   how to take this  when to take this  additional instructions   Naftifine HCl 2 % Crea Commonly known as: Naftin Apply to feet daily for fungal infection What changed:   how much to take  how to take this  when to take this  reasons to take this  additional instructions   omeprazole 40 MG capsule Commonly known as: PRILOSEC Take 1 capsule (40 mg total) by mouth daily.   Pen Needles 32G X 4 MM Misc 1 each by Does not apply route 3 (three) times daily. USE PEN NEEDLES TO INJECT INSULIN THREE TIMES DAILY.   polyethylene glycol 17 g packet Commonly known as: MIRALAX / GLYCOLAX Take 17 g by mouth daily. What changed:   when to take this  reasons to take this   sevelamer carbonate 800 MG tablet Commonly known as: RENVELA Take 1,600 mg by mouth 3 (three) times daily before meals.   simvastatin 20 MG tablet Commonly known as: ZOCOR Take 20 mg by mouth at bedtime.   Tradjenta 5 MG Tabs tablet Generic drug: linagliptin Take 1 tablet (5 mg total) by mouth daily.       Allergies:  Allergies  Allergen Reactions  . Ambien [Zolpidem Tartrate] Other (See Comments)    hallucinations and "felt crazy"   . Penicillins Rash    Has patient had a PCN reaction causing immediate rash, facial/tongue/throat swelling, SOB or lightheadedness with hypotension: Yes Has patient had a PCN reaction causing severe rash involving mucus membranes or skin necrosis: Yes Has patient had a PCN reaction that required hospitalization: No Has patient had a PCN reaction occurring within the last 10 years: No If all of the above answers are "NO", then may proceed with Cephalosporin use.     Past Medical History:  Diagnosis Date  .  Allergy   . Anemia   . Arthritis   . Cataract    bil cateracts removed  . Coronary artery disease   . Dementia arising in the senium and presenium ([place])   . Diabetes mellitus    Type 2  . Diverticulitis   . ED (erectile dysfunction)   . Elevated  homocysteine   . ESRD (end stage renal disease) on dialysis ([place]) 03/2015  . GERD (gastroesophageal reflux disease)    pepto   . Gout   . Hyperlipidemia   . Hypertension   . Hypothyroidism   . Pneumonia   . PVD (peripheral vascular disease) ([place])    has plastic aorta  . Seasonal allergies   . Sleep apnea    does not wear c-pap    Past Surgical History:  Procedure Laterality Date  . aortobifemoral bypass    . AV FISTULA PLACEMENT Left 12/01/2013   Procedure: ARTERIOVENOUS (AV) FISTULA CREATION- LEFT BRACHIOCEPHALIC;  Surgeon: Angelia Mould, MD;  Location: [place];  Service: Vascular;  Laterality: Left;  . [place] TRANSPOSITION Right 07/27/2014   Procedure: BASCILIC VEIN TRANSPOSITION;  Surgeon: Angelia Mould, MD;  Location: [place];  Service: Vascular;  Laterality: Right;  . BRAIN SURGERY  07/22/2018  . BREAST SURGERY     left - granulomatous mastitis  . BURR HOLE Bilateral 07/22/2018   Procedure: BILATERAL BURR HOLES;  Surgeon: Kristeen Miss, MD;  Location: [place];  Service: Neurosurgery;  Laterality: Bilateral;  . COLONOSCOPY    . 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 ENTEROSCOPY N/A 06/03/2018   Procedure: ENTEROSCOPY;  Surgeon: Lavena Bullion, DO;  Location: [place];  Service: Gastroenterology;  Laterality: N/A;  . ENTEROSCOPY N/A 11/20/2018   Procedure: ENTEROSCOPY;  Surgeon: Rush Landmark Telford Nab., MD;  Location: [facility] ENDOSCOPY;  Service: Gastroenterology;  Laterality: N/A;  . EYE SURGERY Bilateral    cataracts  . HEMODIALYSIS INPATIENT  01/17/2018      . HOT HEMOSTASIS N/A 06/03/2018   Procedure: HOT HEMOSTASIS (ARGON PLASMA COAGULATION/BICAP);  Surgeon: Lavena Bullion, DO;  Location: [facility] ENDOSCOPY;  Service: Gastroenterology;  Laterality: N/A;  . HOT HEMOSTASIS N/A 11/20/2018   Procedure: HOT HEMOSTASIS (ARGON PLASMA COAGULATION/BICAP);  Surgeon: Irving Copas., MD;  Location: [place];  Service: Gastroenterology;   Laterality: N/A;  . REVISON OF ARTERIOVENOUS FISTULA Left 02/09/2014   Procedure: REVISON OF LEFT ARTERIOVENOUS FISTULA - RESECTION OF RENDUNDANT VEIN;  Surgeon: Angelia Mould, MD;  Location: [place];  Service: Vascular;  Laterality: Left;  . SBO with lysis adhesions    . SHUNTOGRAM Left 04/19/2014   Procedure: FISTULOGRAM;  Surgeon: Angelia Mould, MD;  Location: [facility] CATH LAB;  Service: Cardiovascular;  Laterality: Left;  . UNILATERAL UPPER EXTREMEITY ANGIOGRAM N/A 07/12/2014   Procedure: UNILATERAL UPPER Anselmo Rod;  Surgeon: Angelia Mould, MD;  Location: [facility] CATH LAB;  Service: Cardiovascular;  Laterality: N/A;    Family History  Problem Relation Age of Onset  . Aneurysm Mother   . Heart disease Father   . Stroke Father   . Hypertension Father   . Diabetes Father   . Dementia Neg Hx   . Colon cancer Neg Hx   . Esophageal cancer Neg Hx   . Pancreatic cancer Neg Hx   . Prostate cancer Neg Hx   . Rectal cancer Neg Hx   . Stomach cancer Neg Hx     Social History:  reports that [name] quit  smoking about 24 years ago. [name] has never used smokeless tobacco. [name] reports that [name] does not drink alcohol or use drugs.    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 [name] is not a candidate for Forteo his nephrologist suggested Prolia [name] has had his first injection of Prolia in 5/19 and last injection was in 02/2019  [name] had rib fractures after his fall in October 2019  Calcium has been variable, also followed by nephrologist  Is not taking any Sensipar  Lab Results  Component Value Date   PTH 154 (H) 10/06/2014   CALCIUM 10.6 (H) 06/16/2019   CAION 1.33 07/21/2018   PHOS 4.0 06/16/2019     Lipid history: [name] has been treated with Simvastatin 20 mg  by PCP, results as  follows    Lab Results  Component Value Date   CHOL 104 04/02/2019   HDL 40 04/02/2019   LDLCALC 44 04/02/2019   TRIG 113 04/02/2019   CHOLHDL 2.6 04/02/2019          [name] has ESRD on dialysis    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  Is on 50 mcg levothyroxine with normal TSH levels, this has been prescribed by PCP  Lab Results  Component Value Date   TSH 1.77 11/13/2018       Physical Examination:  BP 124/78 (BP Location: Left Arm, Patient Position: Sitting, Cuff Size: Normal)   Pulse 67   Ht 5' 7"  (1.702 m)   Wt 167 lb 12.8 oz (76.1 kg)   SpO2 97%   BMI 26.28 kg/m       ASSESSMENT:  Diabetes type 2  See history of present illness for discussion of his current management, blood sugar patterns and problems identified  [name] is being treated only with mealtime insulin and Tradjenta  A1c is relatively higher and also fructosamine  Most of his high readings are probably after breakfast and in the afternoon  Today discussed the difficulty knowing when his blood sugars are going up in the afternoon since [name] does not check blood sugars until before suppertime [name] really did not eat lunch much Fasting readings are fairly good without any long-lasting insulin Also not having hypoglycemia with current mealtime insulin doses  Discussed with [name] and his wife that his pre-meal injection should be adjusted based on what [name] is planning to eat and only some adjustment for premeal blood sugar Have discussed with her the pattern of hyperglycemia related to mealtime rise in blood sugars and when to do the insulin injection     OSTEOPOROSIS with fracture: Needs continued Prolia Vitamin D management will be deferred to nephrologist  Weight loss: Resolved    PLAN:   Needs to take NovoLog before starting to eat regardless of premeal blood sugar especially at breakfast To check readings 2 hours after breakfast  periodically Does not need to check fasting readings every morning Regardless of blood sugar when [name] comes back from dialysis [name] will take the NovoLog only right before suppertime On days [name] is eating lunch [name] will take 4 units of NovoLog before lunch also No change in suppertime NovoLog However if blood sugars are consistently high after breakfast [name] will increase the morning dose  to 6 units  Would be fine to use alogliptin 6.25 mg instead of linagliptin  provided by the [facility] since this could not be costing [name] anything  Recheck A1c and fructosamine on the next visit  Prolia injection will be given in December  Patient Instructions  May need 1-2 U more Novolog in am if 2 Hr suagt >180  Always dose before meal    Counseling time on subjects discussed in assessment and plan sections is over 50% of today's 25 minute visit      Elayne Snare 07/07/2019, 3:04 PM   Note: This office note was prepared with Dragon voice recognition system technology. Any transcriptional errors that result from this process are unintentional.

## 2019-07-07 NOTE — Patient Instructions (Signed)
May need 1-2 U more Novolog in am if 2 Hr suagt >180  Always dose before meal

## 2019-07-08 DIAGNOSIS — E1129 Type 2 diabetes mellitus with other diabetic kidney complication: Secondary | ICD-10-CM | POA: Diagnosis not present

## 2019-07-08 DIAGNOSIS — N186 End stage renal disease: Secondary | ICD-10-CM | POA: Diagnosis not present

## 2019-07-08 DIAGNOSIS — Z992 Dependence on renal dialysis: Secondary | ICD-10-CM | POA: Diagnosis not present

## 2019-07-08 DIAGNOSIS — D509 Iron deficiency anemia, unspecified: Secondary | ICD-10-CM | POA: Diagnosis not present

## 2019-07-08 DIAGNOSIS — N2581 Secondary hyperparathyroidism of renal origin: Secondary | ICD-10-CM | POA: Diagnosis not present

## 2019-07-10 DIAGNOSIS — N186 End stage renal disease: Secondary | ICD-10-CM | POA: Diagnosis not present

## 2019-07-10 DIAGNOSIS — N2581 Secondary hyperparathyroidism of renal origin: Secondary | ICD-10-CM | POA: Diagnosis not present

## 2019-07-10 DIAGNOSIS — E1129 Type 2 diabetes mellitus with other diabetic kidney complication: Secondary | ICD-10-CM | POA: Diagnosis not present

## 2019-07-10 DIAGNOSIS — D509 Iron deficiency anemia, unspecified: Secondary | ICD-10-CM | POA: Diagnosis not present

## 2019-07-10 DIAGNOSIS — Z992 Dependence on renal dialysis: Secondary | ICD-10-CM | POA: Diagnosis not present

## 2019-07-13 DIAGNOSIS — E1129 Type 2 diabetes mellitus with other diabetic kidney complication: Secondary | ICD-10-CM | POA: Diagnosis not present

## 2019-07-13 DIAGNOSIS — D509 Iron deficiency anemia, unspecified: Secondary | ICD-10-CM | POA: Diagnosis not present

## 2019-07-13 DIAGNOSIS — Z992 Dependence on renal dialysis: Secondary | ICD-10-CM | POA: Diagnosis not present

## 2019-07-13 DIAGNOSIS — N2581 Secondary hyperparathyroidism of renal origin: Secondary | ICD-10-CM | POA: Diagnosis not present

## 2019-07-13 DIAGNOSIS — N186 End stage renal disease: Secondary | ICD-10-CM | POA: Diagnosis not present

## 2019-07-15 DIAGNOSIS — E1129 Type 2 diabetes mellitus with other diabetic kidney complication: Secondary | ICD-10-CM | POA: Diagnosis not present

## 2019-07-15 DIAGNOSIS — N186 End stage renal disease: Secondary | ICD-10-CM | POA: Diagnosis not present

## 2019-07-15 DIAGNOSIS — Z992 Dependence on renal dialysis: Secondary | ICD-10-CM | POA: Diagnosis not present

## 2019-07-15 DIAGNOSIS — D509 Iron deficiency anemia, unspecified: Secondary | ICD-10-CM | POA: Diagnosis not present

## 2019-07-15 DIAGNOSIS — N2581 Secondary hyperparathyroidism of renal origin: Secondary | ICD-10-CM | POA: Diagnosis not present

## 2019-07-17 DIAGNOSIS — N2581 Secondary hyperparathyroidism of renal origin: Secondary | ICD-10-CM | POA: Diagnosis not present

## 2019-07-17 DIAGNOSIS — D509 Iron deficiency anemia, unspecified: Secondary | ICD-10-CM | POA: Diagnosis not present

## 2019-07-17 DIAGNOSIS — Z992 Dependence on renal dialysis: Secondary | ICD-10-CM | POA: Diagnosis not present

## 2019-07-17 DIAGNOSIS — E1129 Type 2 diabetes mellitus with other diabetic kidney complication: Secondary | ICD-10-CM | POA: Diagnosis not present

## 2019-07-17 DIAGNOSIS — N186 End stage renal disease: Secondary | ICD-10-CM | POA: Diagnosis not present

## 2019-07-20 DIAGNOSIS — N2581 Secondary hyperparathyroidism of renal origin: Secondary | ICD-10-CM | POA: Diagnosis not present

## 2019-07-20 DIAGNOSIS — E1129 Type 2 diabetes mellitus with other diabetic kidney complication: Secondary | ICD-10-CM | POA: Diagnosis not present

## 2019-07-20 DIAGNOSIS — N186 End stage renal disease: Secondary | ICD-10-CM | POA: Diagnosis not present

## 2019-07-20 DIAGNOSIS — Z992 Dependence on renal dialysis: Secondary | ICD-10-CM | POA: Diagnosis not present

## 2019-07-20 DIAGNOSIS — D509 Iron deficiency anemia, unspecified: Secondary | ICD-10-CM | POA: Diagnosis not present

## 2019-07-22 DIAGNOSIS — E1129 Type 2 diabetes mellitus with other diabetic kidney complication: Secondary | ICD-10-CM | POA: Diagnosis not present

## 2019-07-22 DIAGNOSIS — N2581 Secondary hyperparathyroidism of renal origin: Secondary | ICD-10-CM | POA: Diagnosis not present

## 2019-07-22 DIAGNOSIS — Z992 Dependence on renal dialysis: Secondary | ICD-10-CM | POA: Diagnosis not present

## 2019-07-22 DIAGNOSIS — N186 End stage renal disease: Secondary | ICD-10-CM | POA: Diagnosis not present

## 2019-07-22 DIAGNOSIS — D509 Iron deficiency anemia, unspecified: Secondary | ICD-10-CM | POA: Diagnosis not present

## 2019-07-24 DIAGNOSIS — D509 Iron deficiency anemia, unspecified: Secondary | ICD-10-CM | POA: Diagnosis not present

## 2019-07-24 DIAGNOSIS — Z992 Dependence on renal dialysis: Secondary | ICD-10-CM | POA: Diagnosis not present

## 2019-07-24 DIAGNOSIS — N186 End stage renal disease: Secondary | ICD-10-CM | POA: Diagnosis not present

## 2019-07-24 DIAGNOSIS — E1129 Type 2 diabetes mellitus with other diabetic kidney complication: Secondary | ICD-10-CM | POA: Diagnosis not present

## 2019-07-24 DIAGNOSIS — N2581 Secondary hyperparathyroidism of renal origin: Secondary | ICD-10-CM | POA: Diagnosis not present

## 2019-07-26 DIAGNOSIS — E1122 Type 2 diabetes mellitus with diabetic chronic kidney disease: Secondary | ICD-10-CM | POA: Diagnosis not present

## 2019-07-26 DIAGNOSIS — Z992 Dependence on renal dialysis: Secondary | ICD-10-CM | POA: Diagnosis not present

## 2019-07-26 DIAGNOSIS — N186 End stage renal disease: Secondary | ICD-10-CM | POA: Diagnosis not present

## 2019-07-27 DIAGNOSIS — Z992 Dependence on renal dialysis: Secondary | ICD-10-CM | POA: Diagnosis not present

## 2019-07-27 DIAGNOSIS — D509 Iron deficiency anemia, unspecified: Secondary | ICD-10-CM | POA: Diagnosis not present

## 2019-07-27 DIAGNOSIS — E1129 Type 2 diabetes mellitus with other diabetic kidney complication: Secondary | ICD-10-CM | POA: Diagnosis not present

## 2019-07-27 DIAGNOSIS — N2581 Secondary hyperparathyroidism of renal origin: Secondary | ICD-10-CM | POA: Diagnosis not present

## 2019-07-27 DIAGNOSIS — E119 Type 2 diabetes mellitus without complications: Secondary | ICD-10-CM | POA: Diagnosis not present

## 2019-07-27 DIAGNOSIS — N186 End stage renal disease: Secondary | ICD-10-CM | POA: Diagnosis not present

## 2019-07-29 DIAGNOSIS — D509 Iron deficiency anemia, unspecified: Secondary | ICD-10-CM | POA: Diagnosis not present

## 2019-07-29 DIAGNOSIS — E1129 Type 2 diabetes mellitus with other diabetic kidney complication: Secondary | ICD-10-CM | POA: Diagnosis not present

## 2019-07-29 DIAGNOSIS — Z992 Dependence on renal dialysis: Secondary | ICD-10-CM | POA: Diagnosis not present

## 2019-07-29 DIAGNOSIS — N186 End stage renal disease: Secondary | ICD-10-CM | POA: Diagnosis not present

## 2019-07-29 DIAGNOSIS — N2581 Secondary hyperparathyroidism of renal origin: Secondary | ICD-10-CM | POA: Diagnosis not present

## 2019-07-30 ENCOUNTER — Encounter: Payer: Self-pay | Admitting: Neurology

## 2019-07-30 ENCOUNTER — Telehealth: Payer: Self-pay | Admitting: Neurology

## 2019-07-30 ENCOUNTER — Ambulatory Visit (INDEPENDENT_AMBULATORY_CARE_PROVIDER_SITE_OTHER): Payer: Medicare Other | Admitting: Neurology

## 2019-07-30 ENCOUNTER — Other Ambulatory Visit: Payer: Self-pay

## 2019-07-30 VITALS — BP 95/47 | HR 70 | Temp 97.9°F | Ht 71.0 in | Wt 165.0 lb

## 2019-07-30 DIAGNOSIS — S065XAA Traumatic subdural hemorrhage with loss of consciousness status unknown, initial encounter: Secondary | ICD-10-CM

## 2019-07-30 DIAGNOSIS — S065X9A Traumatic subdural hemorrhage with loss of consciousness of unspecified duration, initial encounter: Secondary | ICD-10-CM

## 2019-07-30 MED ORDER — MEMANTINE HCL 5 MG PO TABS: 5.0000 mg | ORAL_TABLET | Freq: Two times a day (BID) | ORAL | 6 refills | Status: DC

## 2019-07-30 NOTE — Telephone Encounter (Signed)
Medicare/tricare order sent to GI. No auth they will reach out to the pt to schedule.  °

## 2019-07-30 NOTE — Progress Notes (Signed)
[name]    Provider:  Dr Jaynee Eagles Referring Provider: Pearson Grippe, MD Primary Care Physician:  Elby Showers, MD  REASON FOR VISIT: Follow-up for SD HISTORY FROM: Patient  HPI 07/30/2019:  [name] is a 78 y.o. male here as requested by Pearson Grippe, MD  for subdural hematoma.  Past medical history bilateral subdural hematoma status post hematoma evacuation, end-stage renal disease on hemodialysis, anemia, secondary hyperparathyroidism, hypertension with intermittent hypotension, dementia, multiple rib fractures, diabetes mellitus and hypothyroidism.  Patient is status post subdural evacuation July 22, 2018.  Here with his wife, he has headaches in the morning, improving, across the forehead, after dialysis.wakes with headaches every morning and then it gets better. He sleeps in a hospital bed and raises the head of the bed. He is very active overnight, he fights with his sheets. Tylenol helps. Cognitive decline.  Reviewed notes, labs and imaging from outside physicians, which showed:  I reviewed the referring providers notes from [facility], patient had a subdural hematoma on 07/29/2018, on hemodialysis Monday Wednesday Friday, no heparin is currently being used, patient keeps clotting even with other interventions, can patient have a light dose of heparin during treatment.  Patient had bilateral subdural hematoma evacuation.  07/30/2017: Patient is stable, he sleeps ok, he was having a lot of stress in the past when he saw [place] with medical issues. He is going through dialysis, heels his memory issues are stable. Discussed his memory, MMSE 28/30,  exam is stable today. If memory loss is progressive refer him to neurocognitive testing at this point we'll monitor clinically.  UPDATE 10/18/2017CM [name], 78 year old male returns for follow-up. He has history of mild cognitive. MOCA is stable. He is currently on Aricept denies side effects to the  medication. He gets his medications through the [place]. He goes to dialysis 3 times a week. He continues to be fairly active. He is planning a vacation to [place] next month. He feels his memory is stable He continues to drive without difficulty. He says he has not gotten lost. He returns for reevaluation    HISTORY OF PRESENT ILLNESS:UPDATE 5/ 3/ 2018CM [name], 78 year old male returns for follow-up with history of mild cognitive impairment. He is currently on Aricept 10 mg daily through the [facility] without side effects. He continues to go to dialysis 3 times a week. He continues to be active. He is going to [place] 2 weeks to see his granddaughter get married. He has had no issues with driving. His  MOCA score is stable. He returns for reevaluation. Memory continues to decline.   UPDATE 10/18/2017CM [name], 78 year old male returns for follow-up. He has history of mild cognitive. MOCA is stable. He is currently on Aricept denies side effects to the medication. He gets his medications through the [place]. He goes to dialysis 3 times a week. He continues to be fairly active. He is planning a vacation to [place] next month. He feels his memory is stable He continues to drive without difficulty. He says he has not gotten lost. He returns for reevaluation   Interval history: 01/12/16 AAPatient no-showed for multiple (3) appointments and was discharged from our practice. I spoke to him and agreed to see him again wit the understanding that if he misses another appointment he will need to find another neurology group. He has been going through a lot recently, trying to get disability and his health has declined. He says he has too many appointments. He  goes to Dr. Renold Genta and also goes to the [place]. He can follow with Dr. Renold Genta, doesn't need to follow with [place] unless he needs [place] if he feels he has too many doctors. He takes 612m of asa a day, not sure why, advised him this is not indicated for stroke prevention.  868mdaily. 65011maily may increase his risk of bleeding. He is on Aricept 59m71mily and we will refill this today. Last hgba1c 7.2. LDL 33. TSH wnl. B12 in July 2016 was 1068 (Hx of b12 deficiency). Memory is stable, in fact he is feeling a little better.   HPI: Johnny ALDACOa 73 y80. male here as a referral from Dr. BaxlRenold Genta memory problems. PMHx peripheral vascular disease, hypertension, hypothyroidism, hyperparathyroidism, diabetes(uncontrolled since starting dialysis), end-stage renal disease on dialysis, hyperlipidemia, b-12 deficiency on injections (B12 237 at that time). Recently he has noticed he can't catch onto things as quickly as he used to. The latter part of may her noticed it, when he was having more medical issues and he was gaining fluid and had a bout of pneumonia. His memory got really bad before he went into the hospital and was started on dialysis. Things are better since then. He is more alert and catching things better. Wife is here and provides much information, she says he can't keep up with his cell phone and keys and he loses them. He goes into the kitchen to make a snack and he forgets to close the cabinets. He is forgetting people's names, appointments. He used to always be able to monitor medications and he doesn't remember what he is taking his meds for now. He used to be very sharp with this meds but there is a big change. He misplaces checks. He does pay the bills. No accidents with the car or at home. No delusions or hallucinations. No changes in personality. He is more anxious because of the medical problems. Wife endorses depression. He still gets out and likes to do things, but the side effects of his medical conditions are hard for him to handle. He is frustrated. No loss of consciousness, no episodes of confusion, no staring spells. No other focal neurologic deficits. He worries a lot. Endorses snoring, excessive daytime fatigue and witnessed apneic events.        Review of Systems: Patient complains of symptoms per HPI as well as the following symptoms: memory loss. Pertinent negatives and positives per HPI. All others negative.   Social History   Socioeconomic History  . Marital status: Married    Spouse name: Johnny MoodNumber of children: 1  . Years of education: 16  42Highest education level: Not on file  Occupational History  . Occupation: Retired  SociScientific laboratory technicianFinancial resource strain: Not on file  . Food insecurity    Worry: Not on file    Inability: Not on file  . Transportation needs    Medical: No    Non-medical: No  Tobacco Use  . Smoking status: Former Smoker    Quit date: 09/24/1994    Years since quitting: 24.8  . Smokeless tobacco: Never Used  Substance and Sexual Activity  . Alcohol use: No    Alcohol/week: 0.0 standard drinks    Comment: Quit Oct. 1977 ("somewhat heavy")  . Drug use: No  . Sexual activity: Not on file  Lifestyle  . Physical activity    Days per week: 3 days    Minutes per  session: 10 min  . Stress: Not at all  Relationships  . Social Herbalist on phone: Not on file    Gets together: Not on file    Attends religious service: Not on file    Active member of club or organization: Not on file    Attends meetings of clubs or organizations: Not on file    Relationship status: Not on file  . Intimate partner violence    Fear of current or ex partner: Not on file    Emotionally abused: Not on file    Physically abused: Not on file    Forced sexual activity: Not on file  Other Topics Concern  . Not on file  Social History Narrative   Lives at home with wife.   Caffeine use: Drinks no tea, "a little soda"   Drinks 1 cup coffee/week   Right handed    Family History  Problem Relation Age of Onset  . Aneurysm Mother        brain  . Heart disease Father   . Stroke Father   . Hypertension Father   . Diabetes Father   . Dementia Neg Hx   . Colon cancer Neg Hx   .  Esophageal cancer Neg Hx   . Pancreatic cancer Neg Hx   . Prostate cancer Neg Hx   . Rectal cancer Neg Hx   . Stomach cancer Neg Hx     Past Medical History:  Diagnosis Date  . Allergy   . Anemia   . Arthritis   . Cataract    bil cateracts removed  . Coronary artery disease   . Dementia arising in the senium and presenium ([place])   . Diabetes mellitus    Type 2  . Diverticulitis   . ED (erectile dysfunction)   . Elevated homocysteine   . ESRD (end stage renal disease) on dialysis ([place]) 03/2015  . GERD (gastroesophageal reflux disease)    pepto   . Gout   . Hyperlipidemia   . Hypertension   . Hypothyroidism   . Pneumonia   . PVD (peripheral vascular disease) ([place])    has plastic aorta  . Seasonal allergies   . Sleep apnea    does not wear c-pap    Past Surgical History:  Procedure Laterality Date  . aortobifemoral bypass    . AV FISTULA PLACEMENT Left 12/01/2013   Procedure: ARTERIOVENOUS (AV) FISTULA CREATION- LEFT BRACHIOCEPHALIC;  Surgeon: Angelia Mould, MD;  Location: [place];  Service: Vascular;  Laterality: Left;  . [place] TRANSPOSITION Right 07/27/2014   Procedure: BASCILIC VEIN TRANSPOSITION;  Surgeon: Angelia Mould, MD;  Location: [place];  Service: Vascular;  Laterality: Right;  . BRAIN SURGERY  07/22/2018  . BREAST SURGERY     left - granulomatous mastitis  . BURR HOLE Bilateral 07/22/2018   Procedure: BILATERAL BURR HOLES;  Surgeon: Kristeen Miss, MD;  Location: [place];  Service: Neurosurgery;  Laterality: Bilateral;  . COLONOSCOPY    . 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 ENTEROSCOPY N/A 06/03/2018   Procedure: ENTEROSCOPY;  Surgeon: Lavena Bullion, DO;  Location: [place];  Service: Gastroenterology;  Laterality: N/A;  . ENTEROSCOPY N/A 11/20/2018   Procedure: ENTEROSCOPY;  Surgeon: Rush Landmark Telford Nab., MD;  Location: [facility] ENDOSCOPY;  Service: Gastroenterology;  Laterality: N/A;  . EYE SURGERY Bilateral     cataracts  . HEMODIALYSIS INPATIENT  01/17/2018      .  HOT HEMOSTASIS N/A 06/03/2018   Procedure: HOT HEMOSTASIS (ARGON PLASMA COAGULATION/BICAP);  Surgeon: Lavena Bullion, DO;  Location: [facility] ENDOSCOPY;  Service: Gastroenterology;  Laterality: N/A;  . HOT HEMOSTASIS N/A 11/20/2018   Procedure: HOT HEMOSTASIS (ARGON PLASMA COAGULATION/BICAP);  Surgeon: Irving Copas., MD;  Location: [place];  Service: Gastroenterology;  Laterality: N/A;  . REVISON OF ARTERIOVENOUS FISTULA Left 02/09/2014   Procedure: REVISON OF LEFT ARTERIOVENOUS FISTULA - RESECTION OF RENDUNDANT VEIN;  Surgeon: Angelia Mould, MD;  Location: [place];  Service: Vascular;  Laterality: Left;  . SBO with lysis adhesions    . SHUNTOGRAM Left 04/19/2014   Procedure: FISTULOGRAM;  Surgeon: Angelia Mould, MD;  Location[facility] CATH LAB;  Service: Cardiovascular;  Laterality: Left;  . UNILATERAL UPPER EXTREMEITY ANGIOGRAM N/A 07/12/2014   Procedure: UNILATERAL UPPER Anselmo Rod;  Surgeon: Angelia Mould, MD;  Location[facility] C[facility] LAB;  Service: Cardiovascular;  Laterality: N/A;    Current Outpatient Medications  Medication Sig Dispense Refill  . acetaminophen (TYLENOL) 325 MG tablet Take 1-2 tablets (325-650 mg total) by mouth every 4 (four) hours as needed for mild pain.    Marland Kitchen allopurinol (ZYLOPRIM) 100 MG tablet Take 100 mg by mouth daily.     . Blood Glucose Monitoring Suppl (FREESTYLE FREEDOM LITE) w/Device KIT Use to check blood sugar 2 times per day dx code E11.65 1 each 0  . Calcium Carbonate Antacid (TUMS PO) Take by mouth.    . camphor-menthol (SARNA) lotion Apply 1 application topically as needed for itching.    . Carboxymethylcellulose Sod PF 0.25 % SOLN Place 1 drop into both eyes as needed (dry eyes).    . Cetirizine HCl (ZYRTEC PO) Take by mouth.    . clobetasol (TEMOVATE) 0.05 % external solution Apply 1 application topically 2 (two) times daily as needed (irritation). Apply to scalp  0   . denosumab (PROLIA) 60 MG/ML SOSY injection Inject 60 mg into the skin every 6 (six) months.    . docusate sodium (COLACE) 100 MG capsule Take 1 capsule (100 mg total) by mouth daily. (Patient taking differently: Take 100 mg by mouth daily as needed for mild constipation. ) 30 capsule 0  . donepezil (ARICEPT) 10 MG tablet Take 10 mg by mouth 2 (two) times a day.     Marland Kitchen FLUOCINOLONE ACETONIDE SCALP 0.01 % OIL Apply 1 application topically at bedtime. Apply to scalp  0  . fluticasone (CUTIVATE) 0.05 % cream Apply 1 application topically 2 (two) times daily as needed (irritation). Apply to face  0  . fluticasone (FLONASE) 50 MCG/ACT nasal spray Place 1 spray into both nostrils daily. (Patient taking differently: Place 1 spray into both nostrils daily as needed for allergies. ) 16 g 2  . glucose blood (FREESTYLE LITE) test strip USE THREE TIMES DAILY 200 strip 2  . hydrocortisone 2.5 % cream Apply 1 application topically as needed for after feeds. Legs and arms    . insulin aspart (NOVOLOG FLEXPEN) 100 UNIT/ML FlexPen INJECT 6-8 UNITS UNDER THE SKIN THREE TIMES DAILY BEFORE MEALS. DX:E11.65 (Patient taking differently: Inject 4-6 Units into the skin 3 (three) times daily with meals. INJECT 4 Unit BEFORE BREAKFAST and lunch   Inject 6 UNITS BEFORE DINNER UNITS UNDER THE SKIN THREE TIMES DAILY BEFORE MEALS. DX:E11.65) 15 mL 12  . Insulin Pen Needle (PEN NEEDLES) 32G X 4 MM MISC 1 each by Does not apply route 3 (three) times daily. USE PEN NEEDLES TO INJECT INSULIN  THREE TIMES DAILY. 100 each 12  . ketoconazole (NIZORAL) 2 % shampoo Apply 1 application topically 2 (two) times a week. (Patient taking differently: Apply 1 application topically daily as needed for irritation. ) 120 mL 0  . Lancets (FREESTYLE) lancets Use as instructed to check blood sugar 2 times per day dx code E11.65 100 each 3  . levothyroxine (SYNTHROID, LEVOTHROID) 50 MCG tablet Take 1 tablet (50 mcg total) by mouth daily. 90 tablet 1  .  midodrine (PROAMATINE) 10 MG tablet Take one pill prior to hemodialysis on MWF (Patient taking differently: Take 10 mg by mouth every Monday, Wednesday, and Friday. Take one pill prior to hemodialysis)    . Naftifine HCl (NAFTIN) 2 % CREA Apply to feet daily for fungal infection (Patient taking differently: Apply 1 application topically daily as needed (for fungal infection). Apply to feet) 60 g prn  . Nutritional Supplements (FEEDING SUPPLEMENT, NEPRO CARB STEADY,) LIQD Take 237 mLs by mouth daily. Take 237 mg - 474 ml per day    . omeprazole (PRILOSEC) 40 MG capsule Take 1 capsule (40 mg total) by mouth daily. 30 capsule 0  . polyethylene glycol (MIRALAX / GLYCOLAX) packet Take 17 g by mouth daily. (Patient taking differently: Take 17 g by mouth daily as needed for mild constipation. ) 14 each 0  . sevelamer carbonate (RENVELA) 800 MG tablet Take 1,600 mg by mouth 3 (three) times daily before meals.     . simvastatin (ZOCOR) 20 MG tablet Take 20 mg by mouth at bedtime.     . TRADJENTA 5 MG TABS tablet Take 1 tablet (5 mg total) by mouth daily. 90 tablet 2  . memantine (NAMENDA) 5 MG tablet Take 1 tablet (5 mg total) by mouth 2 (two) times daily. 60 tablet 6   No current facility-administered medications for this visit.     Allergies as of 07/30/2019 - Review Complete 07/30/2019  Allergen Reaction Noted  . Ambien [zolpidem tartrate] Other (See Comments) 11/21/2018  . Penicillins Rash     Vitals: BP (!) 95/47 (BP Location: Left Arm, Patient Position: Sitting)   Pulse 70   Temp 97.9 F (36.6 C) Comment: taken at front door  Ht _0  (1.803 m)   Wt 165 lb (74.8 kg)   BMI 23.01 kg/m  Last Weight:  Wt Readings from Last 1 Encounters:  07/30/19 165 lb (74.8 kg)   Last Height:   Ht Readings from Last 1 Encounters:  07/30/19 _1  (1.803 m)    MMSE - Mini Mental State Exam 07/30/2019 05/06/2018 07/30/2017  Orientation to time _2 Orientation to Place _3 Registration _4 Attention/ Calculation _5 Recall _6 Language- name 2 objects _7 Language- repeat 0 1 1  Language- follow 3 step command _8 Language- read & follow direction _9 Write a sentence _10 Copy design 0 1 0  Total score _11 Montreal Cognitive Assessment  01/24/2017 07/11/2016 05/20/2015  Visuospatial/ Executive (0/5) _12 Naming (0/3) _13 Attention: Read list of digits (0/2) _14 Attention: Read list of letters (0/1) _15 Attention: Serial 7 subtraction starting at 100 (0/3) _16 Language: Repeat phrase (0/2) _17 Language : Fluency (0/1) 1 0 0  Abstraction (0/2) 2  1 1  Delayed Recall (0/5) _0 Orientation (0/6) _1 Total _2 Adjusted Score (based on education) - - 22    Assessment/Plan:  [name] is a 78 y.o. male here as requested by Pearson Grippe, MD  for subdural hematoma.  Past medical history bilateral subdural hematoma status post hematoma evacuation, end-stage renal disease on hemodialysis, anemia, secondary hyperparathyroidism, hypertension with intermittent hypotension, dementia, multiple rib fractures, diabetes mellitus and hypothyroidism.  Patient is status post subdural evacuation July 22, 2018.  I reviewed the referring providers notes from [facility], patient had a subdural hematoma on 07/29/2018, on hemodialysis Monday Wednesday Friday, no heparin is currently being used, patient keeps clotting even with other interventions, can patient have a light dose of heparin during treatment.  Patient had bilateral subdural hematoma evacuation.  Repeat CT head, if stable or resolving I think it is fine to use low-dse heparin during dialysis Continued cognitive decline, MMSE 22/30: Continue Aricept, start Memantine  Orders Placed This Encounter  Procedures  . CT HEAD WO CONTRAST   Meds ordered this encounter  Medications  . memantine (NAMENDA) 5 MG tablet    Sig: Take 1 tablet (5 mg total) by mouth 2  (two) times daily.    Dispense:  60 tablet    Refill:  Hardy, MD  [facility] [address] [place], [place] [address]  Phone [phone] Fax [phone]  Cc: Emeline General MD, Pearson Grippe, MD   A total of 25 minutes was spent face-to-face with this patient. Over half this time was spent on counseling patient on the MCI diagnosis and different diagnostic and therapeutic options available.

## 2019-07-30 NOTE — Patient Instructions (Signed)
CT of the head Memantine Tablets What is this medicine? MEMANTINE (MEM an teen) is used to treat dementia caused by Alzheimer's disease. This medicine may be used for other purposes; ask your health care provider or pharmacist if you have questions. COMMON BRAND NAME(S): Namenda What should I tell my health care provider before I take this medicine? They need to know if you have any of these conditions:  difficulty passing urine  kidney disease  liver disease  seizures  an unusual or allergic reaction to memantine, other medicines, foods, dyes, or preservatives  pregnant or trying to get pregnant  breast-feeding How should I use this medicine? Take this medicine by mouth with a glass of water. Follow the directions on the prescription label. You may take this medicine with or without food. Take your doses at regular intervals. Do not take your medicine more often than directed. Continue to take your medicine even if you feel better. Do not stop taking except on the advice of your doctor or health care professional. Talk to your pediatrician regarding the use of this medicine in children. Special care may be needed. Overdosage: If you think you have taken too much of this medicine contact a poison control center or emergency room at once. NOTE: This medicine is only for you. Do not share this medicine with others. What if I miss a dose? If you miss a dose, take it as soon as you can. If it is almost time for your next dose, take only that dose. Do not take double or extra doses. If you do not take your medicine for several days, contact your health care provider. Your dose may need to be changed. What may interact with this medicine?  acetazolamide  amantadine  cimetidine  dextromethorphan  dofetilide  hydrochlorothiazide  ketamine  metformin  methazolamide  quinidine  ranitidine  sodium bicarbonate  triamterene This list may not describe all possible  interactions. Give your health care provider a list of all the medicines, herbs, non-prescription drugs, or dietary supplements you use. Also tell them if you smoke, drink alcohol, or use illegal drugs. Some items may interact with your medicine. What should I watch for while using this medicine? Visit your doctor or health care professional for regular checks on your progress. Check with your doctor or health care professional if there is no improvement in your symptoms or if they get worse. You may get drowsy or dizzy. Do not drive, use machinery, or do anything that needs mental alertness until you know how this drug affects you. Do not stand or sit up quickly, especially if you are an older patient. This reduces the risk of dizzy or fainting spells. Alcohol can make you more drowsy and dizzy. Avoid alcoholic drinks. What side effects may I notice from receiving this medicine? Side effects that you should report to your doctor or health care professional as soon as possible:  allergic reactions like skin rash, itching or hives, swelling of the face, lips, or tongue  agitation or a feeling of restlessness  depressed mood  dizziness  hallucinations  redness, blistering, peeling or loosening of the skin, including inside the mouth  seizures  vomiting Side effects that usually do not require medical attention (report to your doctor or health care professional if they continue or are bothersome):  constipation  diarrhea  headache  nausea  trouble sleeping This list may not describe all possible side effects. Call your doctor for medical advice about side effects. You  may report side effects to FDA at 1-800-FDA-1088. Where should I keep my medicine? Keep out of the reach of children. Store at room temperature between 15 degrees and 30 degrees C (59 degrees and 86 degrees F). Throw away any unused medicine after the expiration date. NOTE: This sheet is a summary. It may not cover all  possible information. If you have questions about this medicine, talk to your doctor, pharmacist, or health care provider.  2020 Elsevier/Gold Standard (2013-06-29 14:10:42)

## 2019-07-31 DIAGNOSIS — N186 End stage renal disease: Secondary | ICD-10-CM | POA: Diagnosis not present

## 2019-07-31 DIAGNOSIS — D509 Iron deficiency anemia, unspecified: Secondary | ICD-10-CM | POA: Diagnosis not present

## 2019-07-31 DIAGNOSIS — E1129 Type 2 diabetes mellitus with other diabetic kidney complication: Secondary | ICD-10-CM | POA: Diagnosis not present

## 2019-07-31 DIAGNOSIS — Z992 Dependence on renal dialysis: Secondary | ICD-10-CM | POA: Diagnosis not present

## 2019-07-31 DIAGNOSIS — N2581 Secondary hyperparathyroidism of renal origin: Secondary | ICD-10-CM | POA: Diagnosis not present

## 2019-08-03 DIAGNOSIS — Z992 Dependence on renal dialysis: Secondary | ICD-10-CM | POA: Diagnosis not present

## 2019-08-03 DIAGNOSIS — N186 End stage renal disease: Secondary | ICD-10-CM | POA: Diagnosis not present

## 2019-08-05 DIAGNOSIS — Z992 Dependence on renal dialysis: Secondary | ICD-10-CM | POA: Diagnosis not present

## 2019-08-05 DIAGNOSIS — N186 End stage renal disease: Secondary | ICD-10-CM | POA: Diagnosis not present

## 2019-08-08 DIAGNOSIS — N2581 Secondary hyperparathyroidism of renal origin: Secondary | ICD-10-CM | POA: Diagnosis not present

## 2019-08-08 DIAGNOSIS — N186 End stage renal disease: Secondary | ICD-10-CM | POA: Diagnosis not present

## 2019-08-08 DIAGNOSIS — E1129 Type 2 diabetes mellitus with other diabetic kidney complication: Secondary | ICD-10-CM | POA: Diagnosis not present

## 2019-08-08 DIAGNOSIS — Z992 Dependence on renal dialysis: Secondary | ICD-10-CM | POA: Diagnosis not present

## 2019-08-08 DIAGNOSIS — D509 Iron deficiency anemia, unspecified: Secondary | ICD-10-CM | POA: Diagnosis not present

## 2019-08-10 DIAGNOSIS — N2581 Secondary hyperparathyroidism of renal origin: Secondary | ICD-10-CM | POA: Diagnosis not present

## 2019-08-10 DIAGNOSIS — N186 End stage renal disease: Secondary | ICD-10-CM | POA: Diagnosis not present

## 2019-08-10 DIAGNOSIS — E1129 Type 2 diabetes mellitus with other diabetic kidney complication: Secondary | ICD-10-CM | POA: Diagnosis not present

## 2019-08-10 DIAGNOSIS — D509 Iron deficiency anemia, unspecified: Secondary | ICD-10-CM | POA: Diagnosis not present

## 2019-08-10 DIAGNOSIS — Z992 Dependence on renal dialysis: Secondary | ICD-10-CM | POA: Diagnosis not present

## 2019-08-11 ENCOUNTER — Ambulatory Visit
Admission: RE | Admit: 2019-08-11 | Discharge: 2019-08-11 | Disposition: A | Discharge status: Home / Self Care | Payer: Medicare Other | Source: Ambulatory Visit | Attending: Neurology | Admitting: Neurology

## 2019-08-11 DIAGNOSIS — S065XAA Traumatic subdural hemorrhage with loss of consciousness status unknown, initial encounter: Secondary | ICD-10-CM

## 2019-08-11 DIAGNOSIS — S065X9A Traumatic subdural hemorrhage with loss of consciousness of unspecified duration, initial encounter: Secondary | ICD-10-CM

## 2019-08-12 DIAGNOSIS — N186 End stage renal disease: Secondary | ICD-10-CM | POA: Diagnosis not present

## 2019-08-12 DIAGNOSIS — E1129 Type 2 diabetes mellitus with other diabetic kidney complication: Secondary | ICD-10-CM | POA: Diagnosis not present

## 2019-08-12 DIAGNOSIS — N2581 Secondary hyperparathyroidism of renal origin: Secondary | ICD-10-CM | POA: Diagnosis not present

## 2019-08-12 DIAGNOSIS — Z992 Dependence on renal dialysis: Secondary | ICD-10-CM | POA: Diagnosis not present

## 2019-08-12 DIAGNOSIS — D509 Iron deficiency anemia, unspecified: Secondary | ICD-10-CM | POA: Diagnosis not present

## 2019-08-14 ENCOUNTER — Other Ambulatory Visit: Payer: Self-pay | Admitting: Neurology

## 2019-08-14 ENCOUNTER — Telehealth: Payer: Self-pay | Admitting: Neurology

## 2019-08-14 DIAGNOSIS — Z992 Dependence on renal dialysis: Secondary | ICD-10-CM | POA: Diagnosis not present

## 2019-08-14 DIAGNOSIS — E1129 Type 2 diabetes mellitus with other diabetic kidney complication: Secondary | ICD-10-CM | POA: Diagnosis not present

## 2019-08-14 DIAGNOSIS — N186 End stage renal disease: Secondary | ICD-10-CM | POA: Diagnosis not present

## 2019-08-14 DIAGNOSIS — D509 Iron deficiency anemia, unspecified: Secondary | ICD-10-CM | POA: Diagnosis not present

## 2019-08-14 DIAGNOSIS — N2581 Secondary hyperparathyroidism of renal origin: Secondary | ICD-10-CM | POA: Diagnosis not present

## 2019-08-14 NOTE — Telephone Encounter (Signed)
the CT of the head is improved but still shows some blood. I would repeat the CT of the head at the end of the year to see if it has resolved before starting any heparin, if possible. It just may be too soon at this time for all the blood to be resorbed. There is a chance that some blood will always be there, but I would hold off on any heparin until the next scan. We will call at the end of the year prior to ordering. However if this is significantly affecting dialysis, the conversation should be had with patient of risk vs benefit of giving low dose heparin only at dialysis. Low-dose heparin at dialysis less likely to cause significant re-bleeding however there is always a risk. ( FYI Dr. Joelyn Oms, MD). If possible I would wait until repeat CT.  thanks.

## 2019-08-16 DIAGNOSIS — N2581 Secondary hyperparathyroidism of renal origin: Secondary | ICD-10-CM | POA: Diagnosis not present

## 2019-08-16 DIAGNOSIS — N186 End stage renal disease: Secondary | ICD-10-CM | POA: Diagnosis not present

## 2019-08-16 DIAGNOSIS — Z992 Dependence on renal dialysis: Secondary | ICD-10-CM | POA: Diagnosis not present

## 2019-08-16 DIAGNOSIS — D509 Iron deficiency anemia, unspecified: Secondary | ICD-10-CM | POA: Diagnosis not present

## 2019-08-16 DIAGNOSIS — E1129 Type 2 diabetes mellitus with other diabetic kidney complication: Secondary | ICD-10-CM | POA: Diagnosis not present

## 2019-08-18 DIAGNOSIS — N186 End stage renal disease: Secondary | ICD-10-CM | POA: Diagnosis not present

## 2019-08-18 DIAGNOSIS — Z992 Dependence on renal dialysis: Secondary | ICD-10-CM | POA: Diagnosis not present

## 2019-08-18 DIAGNOSIS — D509 Iron deficiency anemia, unspecified: Secondary | ICD-10-CM | POA: Diagnosis not present

## 2019-08-18 DIAGNOSIS — N2581 Secondary hyperparathyroidism of renal origin: Secondary | ICD-10-CM | POA: Diagnosis not present

## 2019-08-18 DIAGNOSIS — E1129 Type 2 diabetes mellitus with other diabetic kidney complication: Secondary | ICD-10-CM | POA: Diagnosis not present

## 2019-08-18 NOTE — Telephone Encounter (Signed)
Called pt @ [phone] and LVM (ok per DPR) reiterating Dr. Cathren Laine message: the CT of the head is improved but still shows some blood. I would repeat the CT of the head at the end of the year to see if it has resolved before starting any heparin, if possible. It just may be too soon at this time for all the blood to be resorbed. There is a chance that some blood will always be there, but I would hold off on any heparin until the next scan.  Pt had viewed mychart results. I asked for a call back if they had any questions.

## 2019-08-21 DIAGNOSIS — Z992 Dependence on renal dialysis: Secondary | ICD-10-CM | POA: Diagnosis not present

## 2019-08-21 DIAGNOSIS — N186 End stage renal disease: Secondary | ICD-10-CM | POA: Diagnosis not present

## 2019-08-21 DIAGNOSIS — E1129 Type 2 diabetes mellitus with other diabetic kidney complication: Secondary | ICD-10-CM | POA: Diagnosis not present

## 2019-08-21 DIAGNOSIS — D509 Iron deficiency anemia, unspecified: Secondary | ICD-10-CM | POA: Diagnosis not present

## 2019-08-21 DIAGNOSIS — N2581 Secondary hyperparathyroidism of renal origin: Secondary | ICD-10-CM | POA: Diagnosis not present

## 2019-08-24 DIAGNOSIS — N2581 Secondary hyperparathyroidism of renal origin: Secondary | ICD-10-CM | POA: Diagnosis not present

## 2019-08-24 DIAGNOSIS — D509 Iron deficiency anemia, unspecified: Secondary | ICD-10-CM | POA: Diagnosis not present

## 2019-08-24 DIAGNOSIS — N186 End stage renal disease: Secondary | ICD-10-CM | POA: Diagnosis not present

## 2019-08-24 DIAGNOSIS — Z992 Dependence on renal dialysis: Secondary | ICD-10-CM | POA: Diagnosis not present

## 2019-08-24 DIAGNOSIS — E1129 Type 2 diabetes mellitus with other diabetic kidney complication: Secondary | ICD-10-CM | POA: Diagnosis not present

## 2019-08-31 ENCOUNTER — Other Ambulatory Visit: Payer: Self-pay

## 2019-08-31 ENCOUNTER — Ambulatory Visit (INDEPENDENT_AMBULATORY_CARE_PROVIDER_SITE_OTHER): Payer: Medicare Other | Admitting: Physician Assistant

## 2019-08-31 ENCOUNTER — Encounter: Payer: Self-pay | Admitting: *Deleted

## 2019-08-31 ENCOUNTER — Telehealth: Payer: Self-pay | Admitting: *Deleted

## 2019-08-31 VITALS — BP 140/52 | HR 68 | Temp 97.4°F | Resp 16 | Ht 71.0 in | Wt 173.0 lb

## 2019-08-31 DIAGNOSIS — A498 Other bacterial infections of unspecified site: Secondary | ICD-10-CM | POA: Diagnosis not present

## 2019-08-31 DIAGNOSIS — Z992 Dependence on renal dialysis: Secondary | ICD-10-CM | POA: Diagnosis not present

## 2019-08-31 DIAGNOSIS — N186 End stage renal disease: Secondary | ICD-10-CM

## 2019-08-31 NOTE — Progress Notes (Signed)
Called Adriann at [facility] testing site and scheduled patient for rapid swab for Covid-19 at 8 am 09/01/2019. Patient instructed. Left messages for Baker Janus PST at [facility] that patient add on for surgery tomorrow and will need a pre-op call this afternoon.

## 2019-08-31 NOTE — H&P (View-Only) (Signed)
HISTORY AND PHYSICAL     CC:  dialysis access Requesting Provider:  Elby Showers, MD  HPI: This is a 78 y.o. male here for evaluation for hemodialysis access.  He went to dialysis this morning and he had some drainage from his fistula.  He says they did a culture on it this morning.  He was sent here due to this.  He has not had any fevers.    Access Hx: -Left BC AVF 12/01/13 with revision 02/09/14 -venoplasty 4 areas of stenosis within [facility] AVF 04/19/14 -right BVT 07/27/14  He has hx of PAD and aortobifemoral bypass grafting by Dr. Amedeo Plenty years ago. He was supposed to f/u with ABI's last year, but this did not happen.  He states his legs are good and he does not have any wounds on his feet.  He states that his feet are checked regularly.    In October 2019, he underwent bilateral bur holes for drainage of subdural hematomas.    The pt is on a statin for cholesterol management.  The pt is diabetic.   The pt is not on medication for hypertension.   Tobacco hx:  remote The pt is not on a daily aspirin. Other AC:  none  Past Medical History:  Diagnosis Date  . Allergy   . Anemia   . Arthritis   . Cataract    bil cateracts removed  . Coronary artery disease   . Dementia arising in the senium and presenium ([place])   . Diabetes mellitus    Type 2  . Diverticulitis   . ED (erectile dysfunction)   . Elevated homocysteine   . ESRD (end stage renal disease) on dialysis ([place]) 03/2015  . GERD (gastroesophageal reflux disease)    pepto   . Gout   . Hyperlipidemia   . Hypertension   . Hypothyroidism   . Pneumonia   . PVD (peripheral vascular disease) ([place])    has plastic aorta  . Seasonal allergies   . Sleep apnea    does not wear c-pap    Past Surgical History:  Procedure Laterality Date  . aortobifemoral bypass    . AV FISTULA PLACEMENT Left 12/01/2013   Procedure: ARTERIOVENOUS (AV) FISTULA CREATION- LEFT BRACHIOCEPHALIC;  Surgeon: Angelia Mould, MD;  Location: [place];   Service: Vascular;  Laterality: Left;  . [place] TRANSPOSITION Right 07/27/2014   Procedure: BASCILIC VEIN TRANSPOSITION;  Surgeon: Angelia Mould, MD;  Location: [place];  Service: Vascular;  Laterality: Right;  . BRAIN SURGERY  07/22/2018  . BREAST SURGERY     left - granulomatous mastitis  . BURR HOLE Bilateral 07/22/2018   Procedure: BILATERAL BURR HOLES;  Surgeon: Kristeen Miss, MD;  Location: [place];  Service: Neurosurgery;  Laterality: Bilateral;  . COLONOSCOPY    . 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 ENTEROSCOPY N/A 06/03/2018   Procedure: ENTEROSCOPY;  Surgeon: Lavena Bullion, DO;  Location: [place];  Service: Gastroenterology;  Laterality: N/A;  . ENTEROSCOPY N/A 11/20/2018   Procedure: ENTEROSCOPY;  Surgeon: Rush Landmark Telford Nab., MD;  Location: [facility] ENDOSCOPY;  Service: Gastroenterology;  Laterality: N/A;  . EYE SURGERY Bilateral    cataracts  . HEMODIALYSIS INPATIENT  01/17/2018      . HOT HEMOSTASIS N/A 06/03/2018   Procedure: HOT HEMOSTASIS (ARGON PLASMA COAGULATION/BICAP);  Surgeon: Lavena Bullion, DO;  Location: [facility] ENDOSCOPY;  Service: Gastroenterology;  Laterality: N/A;  . HOT HEMOSTASIS N/A 11/20/2018  Procedure: HOT HEMOSTASIS (ARGON PLASMA COAGULATION/BICAP);  Surgeon: Irving Copas., MD;  Location: [place];  Service: Gastroenterology;  Laterality: N/A;  . REVISON OF ARTERIOVENOUS FISTULA Left 02/09/2014   Procedure: REVISON OF LEFT ARTERIOVENOUS FISTULA - RESECTION OF RENDUNDANT VEIN;  Surgeon: Angelia Mould, MD;  Location: [place];  Service: Vascular;  Laterality: Left;  . SBO with lysis adhesions    . SHUNTOGRAM Left 04/19/2014   Procedure: FISTULOGRAM;  Surgeon: Angelia Mould, MD;  Location: [facility] CATH LAB;  Service: Cardiovascular;  Laterality: Left;  . UNILATERAL UPPER EXTREMEITY ANGIOGRAM N/A 07/12/2014   Procedure: UNILATERAL UPPER Anselmo Rod;  Surgeon: Angelia Mould, MD;  Location: [facility]  CATH LAB;  Service: Cardiovascular;  Laterality: N/A;    Allergies  Allergen Reactions  . Ambien [Zolpidem Tartrate] Other (See Comments)    hallucinations and "felt crazy"   . Penicillins Rash    Has patient had a PCN reaction causing immediate rash, facial/tongue/throat swelling, SOB or lightheadedness with hypotension: Yes Has patient had a PCN reaction causing severe rash involving mucus membranes or skin necrosis: Yes Has patient had a PCN reaction that required hospitalization: No Has patient had a PCN reaction occurring within the last 10 years: No If all of the above answers are "NO", then may proceed with Cephalosporin use.     Current Outpatient Medications  Medication Sig Dispense Refill  . acetaminophen (TYLENOL) 325 MG tablet Take 1-2 tablets (325-650 mg total) by mouth every 4 (four) hours as needed for mild pain.    Marland Kitchen allopurinol (ZYLOPRIM) 100 MG tablet Take 100 mg by mouth daily.     . Blood Glucose Monitoring Suppl (FREESTYLE FREEDOM LITE) w/Device KIT Use to check blood sugar 2 times per day dx code E11.65 1 each 0  . Calcium Carbonate Antacid (TUMS PO) Take by mouth.    . camphor-menthol (SARNA) lotion Apply 1 application topically as needed for itching.    . Carboxymethylcellulose Sod PF 0.25 % SOLN Place 1 drop into both eyes as needed (dry eyes).    . Cetirizine HCl (ZYRTEC PO) Take by mouth.    . clobetasol (TEMOVATE) 0.05 % external solution Apply 1 application topically 2 (two) times daily as needed (irritation). Apply to scalp  0  . denosumab (PROLIA) 60 MG/ML SOSY injection Inject 60 mg into the skin every 6 (six) months.    . docusate sodium (COLACE) 100 MG capsule Take 1 capsule (100 mg total) by mouth daily. (Patient taking differently: Take 100 mg by mouth daily as needed for mild constipation. ) 30 capsule 0  . donepezil (ARICEPT) 10 MG tablet Take 10 mg by mouth 2 (two) times a day.     Marland Kitchen FLUOCINOLONE ACETONIDE SCALP 0.01 % OIL Apply 1 application  topically at bedtime. Apply to scalp  0  . fluticasone (CUTIVATE) 0.05 % cream Apply 1 application topically 2 (two) times daily as needed (irritation). Apply to face  0  . fluticasone (FLONASE) 50 MCG/ACT nasal spray Place 1 spray into both nostrils daily. (Patient taking differently: Place 1 spray into both nostrils daily as needed for allergies. ) 16 g 2  . glucose blood (FREESTYLE LITE) test strip USE THREE TIMES DAILY 200 strip 2  . hydrocortisone 2.5 % cream Apply 1 application topically as needed for after feeds. Legs and arms    . insulin aspart (NOVOLOG FLEXPEN) 100 UNIT/ML FlexPen INJECT 6-8 UNITS UNDER THE SKIN THREE TIMES DAILY BEFORE MEALS. DX:E11.65 (Patient taking differently: Inject 4-6  Units into the skin 3 (three) times daily with meals. INJECT 4 Unit BEFORE BREAKFAST and lunch   Inject 6 UNITS BEFORE DINNER UNITS UNDER THE SKIN THREE TIMES DAILY BEFORE MEALS. DX:E11.65) 15 mL 12  . Insulin Pen Needle (PEN NEEDLES) 32G X 4 MM MISC 1 each by Does not apply route 3 (three) times daily. USE PEN NEEDLES TO INJECT INSULIN THREE TIMES DAILY. 100 each 12  . ketoconazole (NIZORAL) 2 % shampoo Apply 1 application topically 2 (two) times a week. (Patient taking differently: Apply 1 application topically daily as needed for irritation. ) 120 mL 0  . Lancets (FREESTYLE) lancets Use as instructed to check blood sugar 2 times per day dx code E11.65 100 each 3  . levothyroxine (SYNTHROID, LEVOTHROID) 50 MCG tablet Take 1 tablet (50 mcg total) by mouth daily. 90 tablet 1  . memantine (NAMENDA) 5 MG tablet Take 1 tablet (5 mg total) by mouth 2 (two) times daily. 60 tablet 6  . midodrine (PROAMATINE) 10 MG tablet Take one pill prior to hemodialysis on MWF (Patient taking differently: Take 10 mg by mouth every Monday, Wednesday, and Friday. Take one pill prior to hemodialysis)    . Naftifine HCl (NAFTIN) 2 % CREA Apply to feet daily for fungal infection (Patient taking differently: Apply 1 application  topically daily as needed (for fungal infection). Apply to feet) 60 g prn  . Nutritional Supplements (FEEDING SUPPLEMENT, NEPRO CARB STEADY,) LIQD Take 237 mLs by mouth daily. Take 237 mg - 474 ml per day    . omeprazole (PRILOSEC) 40 MG capsule Take 1 capsule (40 mg total) by mouth daily. 30 capsule 0  . polyethylene glycol (MIRALAX / GLYCOLAX) packet Take 17 g by mouth daily. (Patient taking differently: Take 17 g by mouth daily as needed for mild constipation. ) 14 each 0  . sevelamer carbonate (RENVELA) 800 MG tablet Take 1,600 mg by mouth 3 (three) times daily before meals.     . simvastatin (ZOCOR) 20 MG tablet Take 20 mg by mouth at bedtime.     . TRADJENTA 5 MG TABS tablet Take 1 tablet (5 mg total) by mouth daily. 90 tablet 2   No current facility-administered medications for this visit.     Family History  Problem Relation Age of Onset  . Aneurysm Mother        brain  . Heart disease Father   . Stroke Father   . Hypertension Father   . Diabetes Father   . Dementia Neg Hx   . Colon cancer Neg Hx   . Esophageal cancer Neg Hx   . Pancreatic cancer Neg Hx   . Prostate cancer Neg Hx   . Rectal cancer Neg Hx   . Stomach cancer Neg Hx     Social History   Socioeconomic History  . Marital status: Married    Spouse name: [name] Mood  . Number of children: 1  . Years of education: 41  . Highest education level: Not on file  Occupational History  . Occupation: Retired  Scientific laboratory technician  . Financial resource strain: Not on file  . Food insecurity    Worry: Not on file    Inability: Not on file  . Transportation needs    Medical: No    Non-medical: No  Tobacco Use  . Smoking status: Former Smoker    Quit date: 09/24/1994    Years since quitting: 24.9  . Smokeless tobacco: Never Used  Substance and Sexual Activity  .  Alcohol use: No    Alcohol/week: 0.0 standard drinks    Comment: Quit Oct. 1977 ("somewhat heavy")  . Drug use: No  . Sexual activity: Not on file  Lifestyle   . Physical activity    Days per week: 3 days    Minutes per session: 10 min  . Stress: Not at all  Relationships  . Social Herbalist on phone: Not on file    Gets together: Not on file    Attends religious service: Not on file    Active member of club or organization: Not on file    Attends meetings of clubs or organizations: Not on file    Relationship status: Not on file  . Intimate partner violence    Fear of current or ex partner: Not on file    Emotionally abused: Not on file    Physically abused: Not on file    Forced sexual activity: Not on file  Other Topics Concern  . Not on file  Social History Narrative   Lives at home with wife.   Caffeine use: Drinks no tea, "a little soda"   Drinks 1 cup coffee/week   Right handed     ROS: _0  Positive   _1  Negative   _2  All sytems reviewed and are negative  Cardiac: _3  chest pain/pressure _4  SOB _5  DOE  Vascular: _6  pain in legs while walking _7  pain in feet when lying flat _8  hx of DVT _9  swelling in legs  Pulmonary: _10  asthma _11  wheezing  Neurologic: _12  weakness in _13  arms _14  legs _15  numbness in _16  arms _17  legs _18 difficulty speaking or slurred speech _19  temporary loss of vision in one eye _20  dizziness  Hematologic: _21  bleeding problems  GI _22  GERD  GU: _23  CKD/renal failure  _24  HD---_25  M/W/F _26  T/T/S _27  burning with urination _28  blood in urine  Psychiatric: _29  hx of major depression  Integumentary: _30  rashes _31  ulcers  Constitutional: _32  fever _33  chills  PHYSICAL EXAMINATION:  Today's Vitals   08/31/19 1441  BP: (!) 140/52  Pulse: 68  Resp: 16  Temp: (!) 97.4 F (36.3 C)  TempSrc: Temporal  SpO2: 99%  Weight: 173 lb (78.5 kg)  Height: _34  (1.803 m)   Body mass index is 24.13 kg/m.    General:  WDWN male in NAD Gait: Not observed HENT: WNL Pulmonary: normal non-labored breathing , without Rales, rhonchi,  wheezing Cardiac: regular Skin: without rashes,  without ulcers  Vascular Exam/Pulses:  Palpable right radial pulse Extremities:  Small superficial area over fistula where scab fell off earlier today.  No purulence noted during this visit. There is a thrill in the fistula.  Musculoskeletal: no muscle wasting or atrophy  Neurologic: A&O X 3; Moving all extremities equally;  Speech is fluent/normal  Non-Invasive Vascular Imaging:   None today   ASSESSMENT/PLAN: 78 y.o. male with here for evaluation of his access hemodialysis access  -pt with area over fistula where scab has come off.  There is a superficial area over fistula that is exposed.  Unable to obtain any purulence from wound, but dialysis center said there was purulence this morning and sent wound cx.   -Had Dr. Trula Slade inspect wound and he feels pt needs to have this surgically explored tomorrow 12/8 as he is at high risk for bleeding from fistula.   -pt did not get dialysis today due to this area.  Will check labs tomorrow.  May need HD  after surgery at [facility], but will d/w nephrologist tomorrow.  -discussed with wife how to hold pressure should this area bleed and to call 911.    Leontine Locket, PA-C [facility] [phone]  Clinic MD:   Pt seen and examined with Dr. Trula Slade

## 2019-08-31 NOTE — Progress Notes (Signed)
HISTORY AND PHYSICAL     CC:  dialysis access Requesting Provider:  Elby Showers, MD  HPI: This is a 78 y.o. male here for evaluation for hemodialysis access.  He went to dialysis this morning and he had some drainage from his fistula.  He says they did a culture on it this morning.  He was sent here due to this.  He has not had any fevers.    Access Hx: -Left BC AVF 12/01/13 with revision 02/09/14 -venoplasty 4 areas of stenosis within [facility] AVF 04/19/14 -right BVT 07/27/14  He has hx of PAD and aortobifemoral bypass grafting by Dr. Amedeo Plenty years ago. He was supposed to f/u with ABI's last year, but this did not happen.  He states his legs are good and he does not have any wounds on his feet.  He states that his feet are checked regularly.    In October 2019, he underwent bilateral bur holes for drainage of subdural hematomas.    The pt is on a statin for cholesterol management.  The pt is diabetic.   The pt is not on medication for hypertension.   Tobacco hx:  remote The pt is not on a daily aspirin. Other AC:  none  Past Medical History:  Diagnosis Date  . Allergy   . Anemia   . Arthritis   . Cataract    bil cateracts removed  . Coronary artery disease   . Dementia arising in the senium and presenium ([place])   . Diabetes mellitus    Type 2  . Diverticulitis   . ED (erectile dysfunction)   . Elevated homocysteine   . ESRD (end stage renal disease) on dialysis ([place]) 03/2015  . GERD (gastroesophageal reflux disease)    pepto   . Gout   . Hyperlipidemia   . Hypertension   . Hypothyroidism   . Pneumonia   . PVD (peripheral vascular disease) ([place])    has plastic aorta  . Seasonal allergies   . Sleep apnea    does not wear c-pap    Past Surgical History:  Procedure Laterality Date  . aortobifemoral bypass    . AV FISTULA PLACEMENT Left 12/01/2013   Procedure: ARTERIOVENOUS (AV) FISTULA CREATION- LEFT BRACHIOCEPHALIC;  Surgeon: Angelia Mould, MD;  Location: [place];   Service: Vascular;  Laterality: Left;  . [place] TRANSPOSITION Right 07/27/2014   Procedure: BASCILIC VEIN TRANSPOSITION;  Surgeon: Angelia Mould, MD;  Location: [place];  Service: Vascular;  Laterality: Right;  . BRAIN SURGERY  07/22/2018  . BREAST SURGERY     left - granulomatous mastitis  . BURR HOLE Bilateral 07/22/2018   Procedure: BILATERAL BURR HOLES;  Surgeon: Kristeen Miss, MD;  Location: [place];  Service: Neurosurgery;  Laterality: Bilateral;  . COLONOSCOPY    . 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 ENTEROSCOPY N/A 06/03/2018   Procedure: ENTEROSCOPY;  Surgeon: Lavena Bullion, DO;  Location: [place];  Service: Gastroenterology;  Laterality: N/A;  . ENTEROSCOPY N/A 11/20/2018   Procedure: ENTEROSCOPY;  Surgeon: Rush Landmark Telford Nab., MD;  Location: [facility] ENDOSCOPY;  Service: Gastroenterology;  Laterality: N/A;  . EYE SURGERY Bilateral    cataracts  . HEMODIALYSIS INPATIENT  01/17/2018      . HOT HEMOSTASIS N/A 06/03/2018   Procedure: HOT HEMOSTASIS (ARGON PLASMA COAGULATION/BICAP);  Surgeon: Lavena Bullion, DO;  Location: [facility] ENDOSCOPY;  Service: Gastroenterology;  Laterality: N/A;  . HOT HEMOSTASIS N/A 11/20/2018  Procedure: HOT HEMOSTASIS (ARGON PLASMA COAGULATION/BICAP);  Surgeon: Irving Copas., MD;  Location: [place];  Service: Gastroenterology;  Laterality: N/A;  . REVISON OF ARTERIOVENOUS FISTULA Left 02/09/2014   Procedure: REVISON OF LEFT ARTERIOVENOUS FISTULA - RESECTION OF RENDUNDANT VEIN;  Surgeon: Angelia Mould, MD;  Location: [place];  Service: Vascular;  Laterality: Left;  . SBO with lysis adhesions    . SHUNTOGRAM Left 04/19/2014   Procedure: FISTULOGRAM;  Surgeon: Angelia Mould, MD;  Location: [facility] CATH LAB;  Service: Cardiovascular;  Laterality: Left;  . UNILATERAL UPPER EXTREMEITY ANGIOGRAM N/A 07/12/2014   Procedure: UNILATERAL UPPER Anselmo Rod;  Surgeon: Angelia Mould, MD;  Location: [facility]  CATH LAB;  Service: Cardiovascular;  Laterality: N/A;    Allergies  Allergen Reactions  . Ambien [Zolpidem Tartrate] Other (See Comments)    hallucinations and "felt crazy"   . Penicillins Rash    Has patient had a PCN reaction causing immediate rash, facial/tongue/throat swelling, SOB or lightheadedness with hypotension: Yes Has patient had a PCN reaction causing severe rash involving mucus membranes or skin necrosis: Yes Has patient had a PCN reaction that required hospitalization: No Has patient had a PCN reaction occurring within the last 10 years: No If all of the above answers are "NO", then may proceed with Cephalosporin use.     Current Outpatient Medications  Medication Sig Dispense Refill  . acetaminophen (TYLENOL) 325 MG tablet Take 1-2 tablets (325-650 mg total) by mouth every 4 (four) hours as needed for mild pain.    Marland Kitchen allopurinol (ZYLOPRIM) 100 MG tablet Take 100 mg by mouth daily.     . Blood Glucose Monitoring Suppl (FREESTYLE FREEDOM LITE) w/Device KIT Use to check blood sugar 2 times per day dx code E11.65 1 each 0  . Calcium Carbonate Antacid (TUMS PO) Take by mouth.    . camphor-menthol (SARNA) lotion Apply 1 application topically as needed for itching.    . Carboxymethylcellulose Sod PF 0.25 % SOLN Place 1 drop into both eyes as needed (dry eyes).    . Cetirizine HCl (ZYRTEC PO) Take by mouth.    . clobetasol (TEMOVATE) 0.05 % external solution Apply 1 application topically 2 (two) times daily as needed (irritation). Apply to scalp  0  . denosumab (PROLIA) 60 MG/ML SOSY injection Inject 60 mg into the skin every 6 (six) months.    . docusate sodium (COLACE) 100 MG capsule Take 1 capsule (100 mg total) by mouth daily. (Patient taking differently: Take 100 mg by mouth daily as needed for mild constipation. ) 30 capsule 0  . donepezil (ARICEPT) 10 MG tablet Take 10 mg by mouth 2 (two) times a day.     Marland Kitchen FLUOCINOLONE ACETONIDE SCALP 0.01 % OIL Apply 1 application  topically at bedtime. Apply to scalp  0  . fluticasone (CUTIVATE) 0.05 % cream Apply 1 application topically 2 (two) times daily as needed (irritation). Apply to face  0  . fluticasone (FLONASE) 50 MCG/ACT nasal spray Place 1 spray into both nostrils daily. (Patient taking differently: Place 1 spray into both nostrils daily as needed for allergies. ) 16 g 2  . glucose blood (FREESTYLE LITE) test strip USE THREE TIMES DAILY 200 strip 2  . hydrocortisone 2.5 % cream Apply 1 application topically as needed for after feeds. Legs and arms    . insulin aspart (NOVOLOG FLEXPEN) 100 UNIT/ML FlexPen INJECT 6-8 UNITS UNDER THE SKIN THREE TIMES DAILY BEFORE MEALS. DX:E11.65 (Patient taking differently: Inject 4-6  Units into the skin 3 (three) times daily with meals. INJECT 4 Unit BEFORE BREAKFAST and lunch   Inject 6 UNITS BEFORE DINNER UNITS UNDER THE SKIN THREE TIMES DAILY BEFORE MEALS. DX:E11.65) 15 mL 12  . Insulin Pen Needle (PEN NEEDLES) 32G X 4 MM MISC 1 each by Does not apply route 3 (three) times daily. USE PEN NEEDLES TO INJECT INSULIN THREE TIMES DAILY. 100 each 12  . ketoconazole (NIZORAL) 2 % shampoo Apply 1 application topically 2 (two) times a week. (Patient taking differently: Apply 1 application topically daily as needed for irritation. ) 120 mL 0  . Lancets (FREESTYLE) lancets Use as instructed to check blood sugar 2 times per day dx code E11.65 100 each 3  . levothyroxine (SYNTHROID, LEVOTHROID) 50 MCG tablet Take 1 tablet (50 mcg total) by mouth daily. 90 tablet 1  . memantine (NAMENDA) 5 MG tablet Take 1 tablet (5 mg total) by mouth 2 (two) times daily. 60 tablet 6  . midodrine (PROAMATINE) 10 MG tablet Take one pill prior to hemodialysis on MWF (Patient taking differently: Take 10 mg by mouth every Monday, Wednesday, and Friday. Take one pill prior to hemodialysis)    . Naftifine HCl (NAFTIN) 2 % CREA Apply to feet daily for fungal infection (Patient taking differently: Apply 1 application  topically daily as needed (for fungal infection). Apply to feet) 60 g prn  . Nutritional Supplements (FEEDING SUPPLEMENT, NEPRO CARB STEADY,) LIQD Take 237 mLs by mouth daily. Take 237 mg - 474 ml per day    . omeprazole (PRILOSEC) 40 MG capsule Take 1 capsule (40 mg total) by mouth daily. 30 capsule 0  . polyethylene glycol (MIRALAX / GLYCOLAX) packet Take 17 g by mouth daily. (Patient taking differently: Take 17 g by mouth daily as needed for mild constipation. ) 14 each 0  . sevelamer carbonate (RENVELA) 800 MG tablet Take 1,600 mg by mouth 3 (three) times daily before meals.     . simvastatin (ZOCOR) 20 MG tablet Take 20 mg by mouth at bedtime.     . TRADJENTA 5 MG TABS tablet Take 1 tablet (5 mg total) by mouth daily. 90 tablet 2   No current facility-administered medications for this visit.     Family History  Problem Relation Age of Onset  . Aneurysm Mother        brain  . Heart disease Father   . Stroke Father   . Hypertension Father   . Diabetes Father   . Dementia Neg Hx   . Colon cancer Neg Hx   . Esophageal cancer Neg Hx   . Pancreatic cancer Neg Hx   . Prostate cancer Neg Hx   . Rectal cancer Neg Hx   . Stomach cancer Neg Hx     Social History   Socioeconomic History  . Marital status: Married    Spouse name: [name] Mood  . Number of children: 1  . Years of education: 41  . Highest education level: Not on file  Occupational History  . Occupation: Retired  Scientific laboratory technician  . Financial resource strain: Not on file  . Food insecurity    Worry: Not on file    Inability: Not on file  . Transportation needs    Medical: No    Non-medical: No  Tobacco Use  . Smoking status: Former Smoker    Quit date: 09/24/1994    Years since quitting: 24.9  . Smokeless tobacco: Never Used  Substance and Sexual Activity  .  Alcohol use: No    Alcohol/week: 0.0 standard drinks    Comment: Quit Oct. 1977 ("somewhat heavy")  . Drug use: No  . Sexual activity: Not on file  Lifestyle   . Physical activity    Days per week: 3 days    Minutes per session: 10 min  . Stress: Not at all  Relationships  . Social Herbalist on phone: Not on file    Gets together: Not on file    Attends religious service: Not on file    Active member of club or organization: Not on file    Attends meetings of clubs or organizations: Not on file    Relationship status: Not on file  . Intimate partner violence    Fear of current or ex partner: Not on file    Emotionally abused: Not on file    Physically abused: Not on file    Forced sexual activity: Not on file  Other Topics Concern  . Not on file  Social History Narrative   Lives at home with wife.   Caffeine use: Drinks no tea, "a little soda"   Drinks 1 cup coffee/week   Right handed     ROS: _0  Positive   _1  Negative   _2  All sytems reviewed and are negative  Cardiac: _3  chest pain/pressure _4  SOB _5  DOE  Vascular: _6  pain in legs while walking _7  pain in feet when lying flat _8  hx of DVT _9  swelling in legs  Pulmonary: _10  asthma _11  wheezing  Neurologic: _12  weakness in _13  arms _14  legs _15  numbness in _16  arms _17  legs _18 difficulty speaking or slurred speech _19  temporary loss of vision in one eye _20  dizziness  Hematologic: _21  bleeding problems  GI _22  GERD  GU: _23  CKD/renal failure  _24  HD---_25  M/W/F _26  T/T/S _27  burning with urination _28  blood in urine  Psychiatric: _29  hx of major depression  Integumentary: _30  rashes _31  ulcers  Constitutional: _32  fever _33  chills  PHYSICAL EXAMINATION:  Today's Vitals   08/31/19 1441  BP: (!) 140/52  Pulse: 68  Resp: 16  Temp: (!) 97.4 F (36.3 C)  TempSrc: Temporal  SpO2: 99%  Weight: 173 lb (78.5 kg)  Height: _34  (1.803 m)   Body mass index is 24.13 kg/m.    General:  WDWN male in NAD Gait: Not observed HENT: WNL Pulmonary: normal non-labored breathing , without Rales, rhonchi,  wheezing Cardiac: regular Skin: without rashes,  without ulcers  Vascular Exam/Pulses:  Palpable right radial pulse Extremities:  Small superficial area over fistula where scab fell off earlier today.  No purulence noted during this visit. There is a thrill in the fistula.  Musculoskeletal: no muscle wasting or atrophy  Neurologic: A&O X 3; Moving all extremities equally;  Speech is fluent/normal  Non-Invasive Vascular Imaging:   None today   ASSESSMENT/PLAN: 78 y.o. male with here for evaluation of his access hemodialysis access  -pt with area over fistula where scab has come off.  There is a superficial area over fistula that is exposed.  Unable to obtain any purulence from wound, but dialysis center said there was purulence this morning and sent wound cx.   -Had Dr. Trula Slade inspect wound and he feels pt needs to have this surgically explored tomorrow 12/8 as he is at high risk for bleeding from fistula.   -pt did not get dialysis today due to this area.  Will check labs tomorrow.  May need HD  after surgery at [facility], but will d/w nephrologist tomorrow.  -discussed with wife how to hold pressure should this area bleed and to call 911.    Leontine Locket, PA-C [facility] [phone]  Clinic MD:   Pt seen and examined with Dr. Trula Slade

## 2019-08-31 NOTE — Telephone Encounter (Signed)
Call from [place] at [facility]. Requesting appointment for patient to be seen today due to "draning scab over HD access". Wound cultured by [facility] today. Denies fever or chills. Appt. With PA today given. Aldona Bar will notify patient.

## 2019-08-31 NOTE — Progress Notes (Signed)
Spoke with [name]'s wife, [name] for pre-op call. DPR on file. He states [name] does not have a cardiac history. [name] is a type 2 diabetic. Last A1C was 6.6 on 06/16/19. [name] states that [name]'s fasting blood sugar is usually between 117-140. Instructed her to have [name] check his blood sugar when he gets up in the AM and every 2 hours until he leaves for the hospital. If blood sugar is >220 take 1/2 of usual correction dose of Novolog insulin. If blood sugar is 70 or below, treat with 1/2 cup of clear juice (apple or cranberry) and recheck blood sugar 15 minutes after drinking juice. If blood sugar continues to be 70 or below, call the Short Stay department and ask to speak to a nurse. [name] voiced understanding.  [name] has dementia and [name] needs to be with him in pre-op.  She will take him by [facility] for his Covid test at 8 AM Tuesday.

## 2019-09-01 ENCOUNTER — Encounter (HOSPITAL_COMMUNITY)
Admission: RE | Disposition: A | Discharge status: Home / Self Care | Payer: Self-pay | Source: Home / Self Care | Attending: Surgery

## 2019-09-01 ENCOUNTER — Other Ambulatory Visit: Payer: Self-pay

## 2019-09-01 ENCOUNTER — Ambulatory Visit (HOSPITAL_COMMUNITY)
Admission: RE | Admit: 2019-09-01 | Discharge: 2019-09-01 | Disposition: A | Discharge status: Home / Self Care | Payer: Medicare Other | Attending: Surgery | Admitting: Surgery

## 2019-09-01 ENCOUNTER — Encounter (HOSPITAL_COMMUNITY): Payer: Self-pay

## 2019-09-01 ENCOUNTER — Ambulatory Visit (HOSPITAL_COMMUNITY): Payer: Medicare Other | Admitting: Anesthesiology

## 2019-09-01 ENCOUNTER — Other Ambulatory Visit (HOSPITAL_COMMUNITY)
Admission: RE | Admit: 2019-09-01 | Discharge: 2019-09-01 | Disposition: A | Discharge status: Home / Self Care | Payer: Medicare Other | Source: Ambulatory Visit | Attending: Surgery | Admitting: Surgery

## 2019-09-01 DIAGNOSIS — G4730 Sleep apnea, unspecified: Secondary | ICD-10-CM | POA: Diagnosis not present

## 2019-09-01 DIAGNOSIS — Y841 Kidney dialysis as the cause of abnormal reaction of the patient, or of later complication, without mention of misadventure at the time of the procedure: Secondary | ICD-10-CM | POA: Diagnosis not present

## 2019-09-01 DIAGNOSIS — K219 Gastro-esophageal reflux disease without esophagitis: Secondary | ICD-10-CM | POA: Insufficient documentation

## 2019-09-01 DIAGNOSIS — Z794 Long term (current) use of insulin: Secondary | ICD-10-CM | POA: Insufficient documentation

## 2019-09-01 DIAGNOSIS — E1151 Type 2 diabetes mellitus with diabetic peripheral angiopathy without gangrene: Secondary | ICD-10-CM | POA: Insufficient documentation

## 2019-09-01 DIAGNOSIS — Z20828 Contact with and (suspected) exposure to other viral communicable diseases: Secondary | ICD-10-CM | POA: Insufficient documentation

## 2019-09-01 DIAGNOSIS — Z79899 Other long term (current) drug therapy: Secondary | ICD-10-CM | POA: Diagnosis not present

## 2019-09-01 DIAGNOSIS — E039 Hypothyroidism, unspecified: Secondary | ICD-10-CM | POA: Insufficient documentation

## 2019-09-01 DIAGNOSIS — M1990 Unspecified osteoarthritis, unspecified site: Secondary | ICD-10-CM | POA: Diagnosis not present

## 2019-09-01 DIAGNOSIS — E1122 Type 2 diabetes mellitus with diabetic chronic kidney disease: Secondary | ICD-10-CM | POA: Insufficient documentation

## 2019-09-01 DIAGNOSIS — T82898A Other specified complication of vascular prosthetic devices, implants and grafts, initial encounter: Secondary | ICD-10-CM | POA: Diagnosis not present

## 2019-09-01 DIAGNOSIS — E785 Hyperlipidemia, unspecified: Secondary | ICD-10-CM | POA: Diagnosis not present

## 2019-09-01 DIAGNOSIS — F0390 Unspecified dementia without behavioral disturbance: Secondary | ICD-10-CM | POA: Insufficient documentation

## 2019-09-01 DIAGNOSIS — M109 Gout, unspecified: Secondary | ICD-10-CM | POA: Insufficient documentation

## 2019-09-01 DIAGNOSIS — I2510 Atherosclerotic heart disease of native coronary artery without angina pectoris: Secondary | ICD-10-CM | POA: Diagnosis not present

## 2019-09-01 DIAGNOSIS — I120 Hypertensive chronic kidney disease with stage 5 chronic kidney disease or end stage renal disease: Secondary | ICD-10-CM | POA: Diagnosis not present

## 2019-09-01 DIAGNOSIS — Z992 Dependence on renal dialysis: Secondary | ICD-10-CM | POA: Diagnosis not present

## 2019-09-01 DIAGNOSIS — T82510A Breakdown (mechanical) of surgically created arteriovenous fistula, initial encounter: Secondary | ICD-10-CM | POA: Insufficient documentation

## 2019-09-01 DIAGNOSIS — N186 End stage renal disease: Secondary | ICD-10-CM | POA: Insufficient documentation

## 2019-09-01 DIAGNOSIS — E875 Hyperkalemia: Secondary | ICD-10-CM | POA: Diagnosis not present

## 2019-09-01 DIAGNOSIS — Z87891 Personal history of nicotine dependence: Secondary | ICD-10-CM | POA: Insufficient documentation

## 2019-09-01 DIAGNOSIS — Z79890 Hormone replacement therapy (postmenopausal): Secondary | ICD-10-CM | POA: Insufficient documentation

## 2019-09-01 HISTORY — PX: REVISION OF ARTERIOVENOUS GORETEX GRAFT: SHX6073

## 2019-09-01 LAB — GLUCOSE, CAPILLARY
Glucose-Capillary: 105 mg/dL — ABNORMAL HIGH (ref 70–99)
Glucose-Capillary: 137 mg/dL — ABNORMAL HIGH (ref 70–99)

## 2019-09-01 LAB — POCT I-STAT, CHEM 8
BUN: 81 mg/dL — ABNORMAL HIGH (ref 8–23)
Calcium, Ion: 1.18 mmol/L (ref 1.15–1.40)
Chloride: 98 mmol/L (ref 98–111)
Creatinine, Ser: 13.9 mg/dL — ABNORMAL HIGH (ref 0.61–1.24)
Glucose, Bld: 107 mg/dL — ABNORMAL HIGH (ref 70–99)
HCT: 26 % — ABNORMAL LOW (ref 39.0–52.0)
Hemoglobin: 8.8 g/dL — ABNORMAL LOW (ref 13.0–17.0)
Potassium: 4.3 mmol/L (ref 3.5–5.1)
Sodium: 139 mmol/L (ref 135–145)
TCO2: 27 mmol/L (ref 22–32)

## 2019-09-01 LAB — SARS CORONAVIRUS 2 BY RT PCR (HOSPITAL ORDER, PERFORMED IN ~~LOC~~ HOSPITAL LAB): SARS Coronavirus 2: NEGATIVE

## 2019-09-01 SURGERY — REVISION OF ARTERIOVENOUS GORETEX GRAFT
Anesthesia: General | Site: Arm Upper | Laterality: Right

## 2019-09-01 MED ORDER — SODIUM CHLORIDE 0.9 % IV SOLN
INTRAVENOUS | Status: AC
  Filled 2019-09-01: qty 1.2

## 2019-09-01 MED ORDER — PHENYLEPHRINE HCL (PRESSORS) 10 MG/ML IV SOLN
INTRAVENOUS | Status: DC | PRN
  Administered 2019-09-01 (×3): 120 ug via INTRAVENOUS

## 2019-09-01 MED ORDER — VANCOMYCIN HCL 1000 MG IV SOLR
INTRAVENOUS | Status: DC | PRN
  Administered 2019-09-01: 1000 mg via INTRAVENOUS

## 2019-09-01 MED ORDER — SODIUM CHLORIDE 0.9 % IV SOLN
INTRAVENOUS | Status: DC | PRN
  Administered 2019-09-01: 500 mL

## 2019-09-01 MED ORDER — SODIUM CHLORIDE 0.9 % IV SOLN
INTRAVENOUS | Status: DC | PRN
  Administered 2019-09-01 (×2): via INTRAVENOUS

## 2019-09-01 MED ORDER — 0.9 % SODIUM CHLORIDE (POUR BTL) OPTIME
TOPICAL | Status: DC | PRN
  Administered 2019-09-01: 1000 mL

## 2019-09-01 MED ORDER — LIDOCAINE 2% (20 MG/ML) 5 ML SYRINGE
INTRAMUSCULAR | Status: DC | PRN
  Administered 2019-09-01: 80 mg via INTRAVENOUS

## 2019-09-01 MED ORDER — CHLORHEXIDINE GLUCONATE 4 % EX LIQD: 60.0000 mL | Freq: Once | CUTANEOUS | Status: DC

## 2019-09-01 MED ORDER — SODIUM CHLORIDE 0.9 % IV SOLN
INTRAVENOUS | Status: DC
  Administered 2019-09-01: 11:00:00 via INTRAVENOUS

## 2019-09-01 MED ORDER — PHENYLEPHRINE HCL-NACL 10-0.9 MG/250ML-% IV SOLN
INTRAVENOUS | Status: DC | PRN
  Administered 2019-09-01: 50 ug/min via INTRAVENOUS

## 2019-09-01 MED ORDER — FENTANYL CITRATE (PF) 250 MCG/5ML IJ SOLN
INTRAMUSCULAR | Status: AC
  Filled 2019-09-01: qty 5

## 2019-09-01 MED ORDER — OXYCODONE-ACETAMINOPHEN 5-325 MG PO TABS: 1.0000 | ORAL_TABLET | Freq: Four times a day (QID) | ORAL | 0 refills | Status: DC | PRN

## 2019-09-01 MED ORDER — FENTANYL CITRATE (PF) 100 MCG/2ML IJ SOLN
INTRAMUSCULAR | Status: AC
  Filled 2019-09-01: qty 2

## 2019-09-01 MED ORDER — CEFAZOLIN SODIUM 1 G IJ SOLR
INTRAMUSCULAR | Status: AC
  Filled 2019-09-01: qty 20

## 2019-09-01 MED ORDER — VANCOMYCIN HCL IN DEXTROSE 1-5 GM/200ML-% IV SOLN
1000.0000 mg | INTRAVENOUS | Status: AC
  Administered 2019-09-01: 1000 mg via INTRAVENOUS

## 2019-09-01 MED ORDER — HEMOSTATIC AGENTS (NO CHARGE) OPTIME
TOPICAL | Status: DC | PRN
  Administered 2019-09-01: 1 via TOPICAL

## 2019-09-01 MED ORDER — FENTANYL CITRATE (PF) 100 MCG/2ML IJ SOLN
25.0000 ug | INTRAMUSCULAR | Status: DC | PRN
  Administered 2019-09-01: 25 ug via INTRAVENOUS

## 2019-09-01 MED ORDER — LIDOCAINE-EPINEPHRINE 1 %-1:100000 IJ SOLN
INTRAMUSCULAR | Status: AC
  Filled 2019-09-01: qty 1

## 2019-09-01 MED ORDER — DEXAMETHASONE SODIUM PHOSPHATE 10 MG/ML IJ SOLN
INTRAMUSCULAR | Status: DC | PRN
  Administered 2019-09-01: 4 mg via INTRAVENOUS

## 2019-09-01 MED ORDER — ONDANSETRON HCL 4 MG/2ML IJ SOLN: 4.0000 mg | Freq: Once | INTRAMUSCULAR | Status: DC | PRN

## 2019-09-01 MED ORDER — PROPOFOL 10 MG/ML IV BOLUS
INTRAVENOUS | Status: DC | PRN
  Administered 2019-09-01: 20 mg via INTRAVENOUS
  Administered 2019-09-01 (×2): 50 mg via INTRAVENOUS
  Administered 2019-09-01: 30 mg via INTRAVENOUS

## 2019-09-01 MED ORDER — FENTANYL CITRATE (PF) 100 MCG/2ML IJ SOLN
INTRAMUSCULAR | Status: DC | PRN
  Administered 2019-09-01 (×4): 25 ug via INTRAVENOUS

## 2019-09-01 MED ORDER — EPHEDRINE SULFATE 50 MG/ML IJ SOLN
INTRAMUSCULAR | Status: DC | PRN
  Administered 2019-09-01 (×2): 5 mg via INTRAVENOUS

## 2019-09-01 MED ORDER — LIDOCAINE-EPINEPHRINE 1 %-1:100000 IJ SOLN
INTRAMUSCULAR | Status: DC | PRN
  Administered 2019-09-01: 7 mL

## 2019-09-01 MED ORDER — PROPOFOL 10 MG/ML IV BOLUS
INTRAVENOUS | Status: AC
  Filled 2019-09-01: qty 20

## 2019-09-01 SURGICAL SUPPLY — 36 items
ADH SKN CLS APL DERMABOND .7 (GAUZE/BANDAGES/DRESSINGS) ×1
BNDG ELASTIC 4X5.8 VLCR STR LF (GAUZE/BANDAGES/DRESSINGS) ×2 IMPLANT
CANISTER SUCT 3000ML PPV (MISCELLANEOUS) ×3 IMPLANT
CLIP VESOCCLUDE MED 6/CT (CLIP) ×3 IMPLANT
CLIP VESOCCLUDE SM WIDE 6/CT (CLIP) ×3 IMPLANT
COVER SURGICAL LIGHT HANDLE (MISCELLANEOUS) ×2 IMPLANT
COVER WAND RF STERILE (DRAPES) ×3 IMPLANT
DERMABOND ADVANCED (GAUZE/BANDAGES/DRESSINGS) ×2
DERMABOND ADVANCED .7 DNX12 (GAUZE/BANDAGES/DRESSINGS) ×1 IMPLANT
ELECT REM PT RETURN 9FT ADLT (ELECTROSURGICAL) ×3
ELECTRODE REM PT RTRN 9FT ADLT (ELECTROSURGICAL) ×1 IMPLANT
GAUZE SPONGE 4X4 12PLY STRL (GAUZE/BANDAGES/DRESSINGS) ×2 IMPLANT
GLOVE BIOGEL PI IND STRL 7.5 (GLOVE) ×1 IMPLANT
GLOVE BIOGEL PI INDICATOR 7.5 (GLOVE) ×2
GLOVE SURG SS PI 7.5 STRL IVOR (GLOVE) ×3 IMPLANT
GOWN STRL REUS W/ TWL LRG LVL3 (GOWN DISPOSABLE) ×2 IMPLANT
GOWN STRL REUS W/ TWL XL LVL3 (GOWN DISPOSABLE) ×1 IMPLANT
GOWN STRL REUS W/TWL LRG LVL3 (GOWN DISPOSABLE) ×6
GOWN STRL REUS W/TWL XL LVL3 (GOWN DISPOSABLE) ×3
HEMOSTAT SNOW SURGICEL 2X4 (HEMOSTASIS) ×2 IMPLANT
KIT BASIN OR (CUSTOM PROCEDURE TRAY) ×3 IMPLANT
KIT TURNOVER KIT B (KITS) ×3 IMPLANT
NDL HYPO 25GX1X1/2 BEV (NEEDLE) ×1 IMPLANT
NEEDLE HYPO 25GX1X1/2 BEV (NEEDLE) ×3 IMPLANT
NS IRRIG 1000ML POUR BTL (IV SOLUTION) ×3 IMPLANT
PACK CV ACCESS (CUSTOM PROCEDURE TRAY) ×3 IMPLANT
PAD ARMBOARD 7.5X6 YLW CONV (MISCELLANEOUS) ×6 IMPLANT
SUT ETHILON 3 0 PS 1 (SUTURE) ×2 IMPLANT
SUT PROLENE 5 0 C 1 24 (SUTURE) ×4 IMPLANT
SUT PROLENE 6 0 BV (SUTURE) ×6 IMPLANT
SUT VIC AB 3-0 SH 27 (SUTURE) ×6
SUT VIC AB 3-0 SH 27X BRD (SUTURE) ×2 IMPLANT
SUT VICRYL 4-0 PS2 18IN ABS (SUTURE) IMPLANT
TOWEL GREEN STERILE (TOWEL DISPOSABLE) ×3 IMPLANT
UNDERPAD 30X30 (UNDERPADS AND DIAPERS) ×3 IMPLANT
WATER STERILE IRR 1000ML POUR (IV SOLUTION) ×3 IMPLANT

## 2019-09-01 NOTE — Anesthesia Postprocedure Evaluation (Signed)
Anesthesia Post Note  Patient: [name]  Procedure(s) Performed: REVISION OF ARTERIOVENOUS FISTULA RIGHT ARM (Right Arm Upper)     Patient location during evaluation: PACU Anesthesia Type: General Level of consciousness: awake and alert Pain management: pain level controlled Vital Signs Assessment: post-procedure vital signs reviewed and stable Respiratory status: spontaneous breathing, nonlabored ventilation, respiratory function stable and patient connected to nasal cannula oxygen Cardiovascular status: blood pressure returned to baseline and stable Postop Assessment: no apparent nausea or vomiting Anesthetic complications: no    Last Vitals:  Vitals:   09/01/19 1405 09/01/19 1445  BP:  (!) 145/90  Pulse: 78 76  Resp: 10   Temp:    SpO2: 99% 100%    Last Pain:  Vitals:   09/01/19 1445  TempSrc:   PainSc: 2                  Catalina Gravel

## 2019-09-01 NOTE — Interval H&P Note (Signed)
History and Physical Interval Note:  09/01/2019 11:44 AM  [name]  has presented today for surgery, with the diagnosis of COMPLICATION OF ARTERIOVENOUS FISTULA RIGHT ARM.  The various methods of treatment have been discussed with the patient and family. After consideration of risks, benefits and other options for treatment, the patient has consented to  Procedure(s): REVISION OF ARTERIOVENOUS FISTULA RIGHT ARM (Right) as a surgical intervention.  The patient's history has been reviewed, patient examined, no change in status, stable for surgery.  I have reviewed the patient's chart and labs.  Questions were answered to the patient's satisfaction.     Annamarie Major

## 2019-09-01 NOTE — Anesthesia Procedure Notes (Addendum)
Procedure Name: LMA Insertion Date/Time: 09/01/2019 12:30 PM Performed by: Neldon Newport, CRNA Pre-anesthesia Checklist: Patient identified, Emergency Drugs available, Suction available, Patient being monitored and Timeout performed Patient Re-evaluated:Patient Re-evaluated prior to induction Oxygen Delivery Method: Circle system utilized Preoxygenation: Pre-oxygenation with 100% oxygen Induction Type: IV induction LMA: LMA with gastric port inserted LMA Size: 5.0 Number of attempts: 1 Tube secured with: Tape

## 2019-09-01 NOTE — Discharge Instructions (Signed)
Do not use fistula at new incision site.  Stick above incision for HD.  Keep dressing on until HD tomorrow.

## 2019-09-01 NOTE — Transfer of Care (Signed)
Immediate Anesthesia Transfer of Care Note  Patient: [name]  Procedure(s) Performed: REVISION OF ARTERIOVENOUS FISTULA RIGHT ARM (Right Arm Upper)  Patient Location: PACU  Anesthesia Type:General  Level of Consciousness: awake, alert  and oriented  Airway & Oxygen Therapy: Patient Spontanous Breathing and Patient connected to nasal cannula oxygen  Post-op Assessment: Report given to RN, Post -op Vital signs reviewed and stable and Patient moving all extremities X 4  Post vital signs: Reviewed and stable  Last Vitals:  Vitals Value Taken Time  BP    Temp    Pulse    Resp    SpO2      Last Pain:  Vitals:   09/01/19 1009  TempSrc: Oral  PainSc: 5       Patients Stated Pain Goal: 2 (51/70/01 7494)  Complications: No apparent anesthesia complications

## 2019-09-01 NOTE — Op Note (Signed)
    Patient name: [name] MRN: [id number] DOB: [birth year] Sex: male  09/01/2019 Pre-operative Diagnosis: Ulcerated right brachiocephalic fistula Post-operative diagnosis:  Same Surgeon:  Annamarie Major Assistants: Laurence Slate Procedure:   Resection of ulcerated skin over the fistula and plication of cephalic vein fistula Anesthesia: General Blood Loss: 50 cc Specimens: Skin ellipse was sent for culture  Findings: Palpable thrill within fistula after procedure  Indications: The patient presented to the office yesterday with ulceration over his fistula and possible infection.  I felt that due to concerns over bleeding this needed to be addressed urgently.  Procedure:  The patient was identified in the holding area and taken to [place] [facility]  The patient was then placed supine on the table. general anesthesia was administered.  The patient was prepped and draped in the usual sterile fashion.  A time out was called and antibiotics were administered.  An elliptical incision was made incorporating the ulcerated skin.  Cautery was used to divide the subcutaneous tissue.  I exposed the fistula at the proximal and distal aspects of the incision.  It was slightly aneurysmal.  I then occluded the fistula with vascular clamps.  A #11 blade was used to resect the ulcerated skin including the anterior surface of the fistula.  I resected a few millimeters of the fistula since it was aneurysmal.  I then closed the venotomy in the fistula with 2 layers of running 5-0 Prolene.  Once this was completed, the clamps were released.  There was a good thrill within the fistula.  Hemostasis was achieved with cautery.  The wound was irrigated.  Because of the poor quality of the skin, I elected to close the incision with a running 3-0 nylon suture.  Sterile dressings were applied.  There were no immediate complications.   Disposition: To PACU stable   V. Annamarie Major, M.D., [facility]  [place] Office: [phone] Pager:  [phone]

## 2019-09-01 NOTE — Anesthesia Preprocedure Evaluation (Addendum)
Anesthesia Evaluation  Patient identified by MRN, date of birth, ID band Patient awake    Reviewed: Allergy & Precautions, NPO status , Patient's Chart, lab work & pertinent test results  Airway Mallampati: I  TM Distance: >3 FB Neck ROM: Full    Dental  (+) Partial Lower, Dental Advisory Given, Missing, Partial Upper,    Pulmonary sleep apnea , former smoker,    Pulmonary exam normal breath sounds clear to auscultation       Cardiovascular hypertension, + CAD and + Peripheral Vascular Disease (s/p aortobifemoral bypass; COMPLICATION OF ARTERIOVENOUS FISTULA RIGHT ARM)  Normal cardiovascular exam Rhythm:Regular Rate:Normal  TTE 2018 EF 55-60%, valves normal   Neuro/Psych PSYCHIATRIC DISORDERS Dementia negative neurological ROS     GI/Hepatic Neg liver ROS, GERD  ,  Endo/Other  diabetes, Type 2, Insulin DependentHypothyroidism   Renal/GU Dialysis and ESRFRenal disease     Musculoskeletal  (+) Arthritis ,   Abdominal   Peds  Hematology negative hematology ROS (+) anemia ,   Anesthesia Other Findings Day of surgery medications reviewed with the patient.  Reproductive/Obstetrics                            Anesthesia Physical  Anesthesia Plan  ASA: III  Anesthesia Plan: General   Post-op Pain Management:    Induction: Intravenous  PONV Risk Score and Plan: 2 and Treatment may vary due to age or medical condition, Dexamethasone and Ondansetron  Airway Management Planned: LMA  Additional Equipment:   Intra-op Plan:   Post-operative Plan: Extubation in OR  Informed Consent: I have reviewed the patients History and Physical, chart, labs and discussed the procedure including the risks, benefits and alternatives for the proposed anesthesia with the patient or authorized representative who has indicated his/her understanding and acceptance.     Dental advisory given  Plan Discussed  with: CRNA  Anesthesia Plan Comments:         Anesthesia Quick Evaluation

## 2019-09-02 ENCOUNTER — Encounter (HOSPITAL_COMMUNITY): Payer: Self-pay | Admitting: Surgery

## 2019-09-06 LAB — AEROBIC/ANAEROBIC CULTURE W GRAM STAIN (SURGICAL/DEEP WOUND)

## 2019-09-15 ENCOUNTER — Other Ambulatory Visit (INDEPENDENT_AMBULATORY_CARE_PROVIDER_SITE_OTHER): Payer: Medicare Other

## 2019-09-15 ENCOUNTER — Other Ambulatory Visit: Payer: Self-pay

## 2019-09-15 DIAGNOSIS — E1165 Type 2 diabetes mellitus with hyperglycemia: Secondary | ICD-10-CM

## 2019-09-15 DIAGNOSIS — Z794 Long term (current) use of insulin: Secondary | ICD-10-CM | POA: Diagnosis not present

## 2019-09-15 DIAGNOSIS — E039 Hypothyroidism, unspecified: Secondary | ICD-10-CM

## 2019-09-15 LAB — GLUCOSE, RANDOM: Glucose, Bld: 108 mg/dL — ABNORMAL HIGH (ref 70–99)

## 2019-09-15 LAB — TSH: TSH: 1.95 u[IU]/mL (ref 0.35–4.50)

## 2019-09-15 LAB — HEMOGLOBIN A1C: Hgb A1c MFr Bld: 5.7 % (ref 4.6–6.5)

## 2019-09-16 LAB — FRUCTOSAMINE: Fructosamine: 323 umol/L — ABNORMAL HIGH (ref 0–285)

## 2019-09-21 ENCOUNTER — Other Ambulatory Visit: Payer: Self-pay

## 2019-09-22 ENCOUNTER — Telehealth: Payer: Self-pay | Admitting: *Deleted

## 2019-09-22 ENCOUNTER — Other Ambulatory Visit: Payer: Self-pay

## 2019-09-22 ENCOUNTER — Other Ambulatory Visit: Payer: Self-pay | Admitting: Neurology

## 2019-09-22 ENCOUNTER — Encounter: Payer: Self-pay | Admitting: Endocrinology

## 2019-09-22 ENCOUNTER — Ambulatory Visit (INDEPENDENT_AMBULATORY_CARE_PROVIDER_SITE_OTHER): Payer: Medicare Other | Admitting: Endocrinology

## 2019-09-22 VITALS — BP 130/50 | HR 70 | Ht 71.0 in | Wt 171.6 lb

## 2019-09-22 DIAGNOSIS — M810 Age-related osteoporosis without current pathological fracture: Secondary | ICD-10-CM

## 2019-09-22 DIAGNOSIS — Z794 Long term (current) use of insulin: Secondary | ICD-10-CM | POA: Diagnosis not present

## 2019-09-22 DIAGNOSIS — S065X9A Traumatic subdural hemorrhage with loss of consciousness of unspecified duration, initial encounter: Secondary | ICD-10-CM

## 2019-09-22 DIAGNOSIS — E1165 Type 2 diabetes mellitus with hyperglycemia: Secondary | ICD-10-CM | POA: Diagnosis not present

## 2019-09-22 DIAGNOSIS — S065XAA Traumatic subdural hemorrhage with loss of consciousness status unknown, initial encounter: Secondary | ICD-10-CM

## 2019-09-22 NOTE — Progress Notes (Signed)
Ct head 

## 2019-09-22 NOTE — Patient Instructions (Addendum)
Check blood sugars on waking up 2-3 days a week  Also check blood sugars about 2 hours after meals and do this after different meals by rotation  Recommended blood sugar levels on waking up are 90-130 and about 2 hours after meal is 130-180  Please bring your blood sugar monitor to each visit, thank you   

## 2019-09-22 NOTE — Telephone Encounter (Signed)
[name] ordered another CT head as a recheck as per previously discussed last month. I called the pt and LVM (ok per DPR) letting pt & wife know the order has been placed, insurance auth will take place, then they will receive a call to schedule the CT head appointment. Left office number in message if they have any questions.

## 2019-09-22 NOTE — Progress Notes (Signed)
Patient ID: [name], male   DOB: 06-Oct-1940, 79 y.o.   MRN: [id number]           Reason for Appointment:  Follow-up for endocrinology problems   History of Present Illness:          Date of diagnosis of type 2 diabetes mellitus:  1996      Background history:  He has had long-standing diabetes probably treated with metformin initially and subsequently with sulfonylurea drugs At some point he was also given Actos in addition to his glipizide which he is still taking Records of his control are only available for the last 4 years not on Over the last few years his A1c has been generally in the 6-7% range His A1c was 6.2 in May 2016 and apparently was starting to get low normal blood sugars at times He was admitted to the hospital for fluid overload and renal failure and at that time his Actos was stopped Subsequently his blood sugars had been markedly increased He was switched from regular insulin to NovoLog on his initial consultation  Recent history:   INSULIN regimen: NovoLog 5 units pc breakfast and 6 units before dinner Oral hypoglycemic drugs: Tradjenta 5 mg daily  His A1c tends to be relatively low but is now 5.7, was 6.6  Fructosamine in relatively better at 323, was previously at 335  Current blood sugar patterns and problems identified:  He is mostly checking his blood sugars before breakfast and dinnertime and not after meals as directed.  Appears to have high readings frequently at suppertime on most days and higher when he has dialysis even though only has been able to record his had dialysis time along with occasional candy  His wife says that she will give him 3 units of NovoLog if his blood sugar is high after dialysis and then another 3 units before dinner  Not clear if he has done any readings after breakfast which is at variable times  However frequently will have biscuits with meat in the morning for breakfast along with his protein drink  He has not  gained any weight since last visit  Currently is continuing on Tradjenta, previously was concerned about the cost but now it is affordable   Dialysis is done at 12 pm, doing it on Monday/Wednesday/Fridays     Side effects from medications have been: None  Compliance with the medical regimen: Fair  Glucose monitoring:  done up to 3  times a day         Glucometer:  Freestyle  Blood Glucose readings by review of monitor: Download results as below   PRE-MEAL Fasting  10 AM-12 PM Dinner Bedtime Overall  Glucose range:  95-151   119-227    Mean/median:  123  119  191   151   POST-MEAL PC Breakfast PC Lunch PC Dinner  Glucose range:    108-241  Mean/median:      Previous readings:   PRE-MEAL Fasting Lunch Dinner Bedtime Overall  Glucose range:  89-127      Mean/median:  110   198   145   POST-MEAL PC Breakfast PC Lunch PC Dinner  Glucose range:  122, 193   129-215  Mean/median:    160      Self-care: The diet that the patient has been following is: tries to limit sweets, portions. eating out only about once a week at a steak house        Typical meal intake:  Breakfast is variable, ham biscuit; supper 7-8 pm            Dietician visit, most recent: 08/2017               Exercise:  Not able to do any  Weight history:   Wt Readings from Last 3 Encounters:  09/22/19 171 lb 9.6 oz (77.8 kg)  09/01/19 173 lb (78.5 kg)  08/31/19 173 lb (78.5 kg)    Glycemic control:    Lab Results  Component Value Date   HGBA1C 5.7 09/15/2019   HGBA1C 6.6 (H) 06/16/2019   HGBA1C 5.5 04/02/2019   Lab Results  Component Value Date   MICROALBUR 37.2 09/03/2016   LDLCALC 44 04/02/2019   CREATININE 13.90 (H) 09/01/2019    Lab Results  Component Value Date   FRUCTOSAMINE 323 (H) 09/15/2019   FRUCTOSAMINE 335 (H) 06/16/2019   FRUCTOSAMINE 292 (H) 01/13/2019    PROBLEM 2 Osteoporosis: See review of systems     Allergies as of 09/22/2019      Reactions   Ambien [zolpidem  Tartrate] Other (See Comments)   hallucinations and "felt crazy"    Penicillins Rash   Has patient had a PCN reaction causing immediate rash, facial/tongue/throat swelling, SOB or lightheadedness with hypotension: Yes Has patient had a PCN reaction causing severe rash involving mucus membranes or skin necrosis: Yes Has patient had a PCN reaction that required hospitalization: No Has patient had a PCN reaction occurring within the last 10 years: No If all of the above answers are "NO", then may proceed with Cephalosporin use.      Medication List       Accurate as of September 22, 2019  8:43 PM. If you have any questions, ask your nurse or doctor.        acetaminophen 325 MG tablet Commonly known as: TYLENOL Take 1-2 tablets (325-650 mg total) by mouth every 4 (four) hours as needed for mild pain.   allopurinol 100 MG tablet Commonly known as: ZYLOPRIM Take 100 mg by mouth daily.   camphor-menthol lotion Commonly known as: SARNA Apply 1 application topically as needed for itching.   Carboxymethylcellulose Sod PF 0.25 % Soln Place 1 drop into both eyes as needed (dry eyes).   clobetasol 0.05 % external solution Commonly known as: TEMOVATE Apply 1 application topically 2 (two) times daily as needed (irritation). Apply to scalp   denosumab 60 MG/ML Sosy injection Commonly known as: PROLIA Inject 60 mg into the skin every 6 (six) months.   docusate sodium 100 MG capsule Commonly known as: COLACE Take 1 capsule (100 mg total) by mouth daily. What changed:   when to take this  reasons to take this   donepezil 10 MG tablet Commonly known as: ARICEPT Take 10 mg by mouth 2 (two) times a day.   feeding supplement (NEPRO CARB STEADY) Liqd Take 237 mLs by mouth daily. Take 237 mg - 474 ml per day   Fluocinolone Acetonide Scalp 0.01 % Oil Apply 1 application topically at bedtime. Apply to scalp   fluticasone 0.05 % cream Commonly known as: CUTIVATE Apply 1 application  topically 2 (two) times daily as needed (irritation). Apply to face   fluticasone 50 MCG/ACT nasal spray Commonly known as: FLONASE Place 1 spray into both nostrils daily. What changed:   when to take this  reasons to take this   FreeStyle Freedom Lite w/Device Kit Use to check blood sugar 2 times per day dx code E11.65  freestyle lancets Use as instructed to check blood sugar 2 times per day dx code E11.65   FREESTYLE LITE test strip Generic drug: glucose blood USE THREE TIMES DAILY   hydrocortisone 2.5 % cream Apply 1 application topically as needed for after feeds. Legs and arms   insulin aspart 100 UNIT/ML FlexPen Commonly known as: NovoLOG FlexPen INJECT 6-8 UNITS UNDER THE SKIN THREE TIMES DAILY BEFORE MEALS. DX:E11.65 What changed:   how much to take  how to take this  when to take this  additional instructions   ketoconazole 2 % shampoo Commonly known as: Nizoral Apply 1 application topically 2 (two) times a week. What changed:   when to take this  reasons to take this   levothyroxine 50 MCG tablet Commonly known as: SYNTHROID Take 1 tablet (50 mcg total) by mouth daily.   memantine 5 MG tablet Commonly known as: Namenda Take 1 tablet (5 mg total) by mouth 2 (two) times daily.   midodrine 10 MG tablet Commonly known as: PROAMATINE Take one pill prior to hemodialysis on MWF What changed:   how much to take  how to take this  when to take this  additional instructions   Naftifine HCl 2 % Crea Commonly known as: Naftin Apply to feet daily for fungal infection What changed:   how much to take  how to take this  when to take this  reasons to take this  additional instructions   omeprazole 40 MG capsule Commonly known as: PRILOSEC Take 1 capsule (40 mg total) by mouth daily.   oxyCODONE-acetaminophen 5-325 MG tablet Commonly known as: PERCOCET/ROXICET Take 1 tablet by mouth every 6 (six) hours as needed.   Pen Needles 32G X  4 MM Misc 1 each by Does not apply route 3 (three) times daily. USE PEN NEEDLES TO INJECT INSULIN THREE TIMES DAILY.   polyethylene glycol 17 g packet Commonly known as: MIRALAX / GLYCOLAX Take 17 g by mouth daily. What changed:   when to take this  reasons to take this   sevelamer carbonate 800 MG tablet Commonly known as: RENVELA Take 1,600 mg by mouth 3 (three) times daily before meals.   simvastatin 20 MG tablet Commonly known as: ZOCOR Take 20 mg by mouth at bedtime.   Tradjenta 5 MG Tabs tablet Generic drug: linagliptin Take 1 tablet (5 mg total) by mouth daily.   TUMS PO Take by mouth.   ZYRTEC PO Take by mouth.       Allergies:  Allergies  Allergen Reactions  . Ambien [Zolpidem Tartrate] Other (See Comments)    hallucinations and "felt crazy"   . Penicillins Rash    Has patient had a PCN reaction causing immediate rash, facial/tongue/throat swelling, SOB or lightheadedness with hypotension: Yes Has patient had a PCN reaction causing severe rash involving mucus membranes or skin necrosis: Yes Has patient had a PCN reaction that required hospitalization: No Has patient had a PCN reaction occurring within the last 10 years: No If all of the above answers are "NO", then may proceed with Cephalosporin use.     Past Medical History:  Diagnosis Date  . Allergy   . Anemia   . Arthritis   . Cataract    bil cateracts removed  . Coronary artery disease   . Dementia arising in the senium and presenium ([place])   . Diabetes mellitus    Type 2  . Diverticulitis   . ED (erectile dysfunction)   . Elevated homocysteine   .  ESRD (end stage renal disease) on dialysis ([place]) 03/2015  . GERD (gastroesophageal reflux disease)    pepto   . Gout   . Hyperlipidemia   . Hypertension   . Hypothyroidism   . Pneumonia   . PVD (peripheral vascular disease) ([place])    has plastic aorta  . Seasonal allergies   . Sleep apnea    does not wear c-pap    Past Surgical  History:  Procedure Laterality Date  . aortobifemoral bypass    . AV FISTULA PLACEMENT Left 12/01/2013   Procedure: ARTERIOVENOUS (AV) FISTULA CREATION- LEFT BRACHIOCEPHALIC;  Surgeon: Angelia Mould, MD;  Location: [facility];  Service: Vascular;  Laterality: Left;  . [place] TRANSPOSITION Right 07/27/2014   Procedure: BASCILIC VEIN TRANSPOSITION;  Surgeon: Angelia Mould, MD;  Location: [facility];  Service: Vascular;  Laterality: Right;  . BRAIN SURGERY  07/22/2018  . BREAST SURGERY     left - granulomatous mastitis  . BURR HOLE Bilateral 07/22/2018   Procedure: BILATERAL BURR HOLES;  Surgeon: Kristeen Miss, MD;  Location: [facility];  Service: Neurosurgery;  Laterality: Bilateral;  . COLONOSCOPY    . 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 ENTEROSCOPY N/A 06/03/2018   Procedure: ENTEROSCOPY;  Surgeon: Lavena Bullion, DO;  Location: [facility];  Service: Gastroenterology;  Laterality: N/A;  . ENTEROSCOPY N/A 11/20/2018   Procedure: ENTEROSCOPY;  Surgeon: Rush Landmark Telford Nab., MD;  Location: [facility] ENDOSCOPY;  Service: Gastroenterology;  Laterality: N/A;  . EYE SURGERY Bilateral    cataracts  . HEMODIALYSIS INPATIENT  01/17/2018      . HOT HEMOSTASIS N/A 06/03/2018   Procedure: HOT HEMOSTASIS (ARGON PLASMA COAGULATION/BICAP);  Surgeon: Lavena Bullion, DO;  Location: [facility] ENDOSCOPY;  Service: Gastroenterology;  Laterality: N/A;  . HOT HEMOSTASIS N/A 11/20/2018   Procedure: HOT HEMOSTASIS (ARGON PLASMA COAGULATION/BICAP);  Surgeon: Irving Copas., MD;  Location: [facility];  Service: Gastroenterology;  Laterality: N/A;  . REVISION OF ARTERIOVENOUS GORETEX GRAFT Right 09/01/2019   Procedure: REVISION OF ARTERIOVENOUS FISTULA RIGHT ARM;  Surgeon: Serafina Mitchell, MD;  Location: [facility];  Service: Vascular;  Laterality: Right;  . REVISON OF ARTERIOVENOUS FISTULA Left 02/09/2014   Procedure: REVISON OF LEFT ARTERIOVENOUS FISTULA - RESECTION OF RENDUNDANT VEIN;   Surgeon: Angelia Mould, MD;  Location: [place];  Service: Vascular;  Laterality: Left;  . SBO with lysis adhesions    . SHUNTOGRAM Left 04/19/2014   Procedure: FISTULOGRAM;  Surgeon: Angelia Mould, MD;  Location: [facility] C[facility] LAB;  Service: Cardiovascular;  Laterality: Left;  . UNILATERAL UPPER EXTREMEITY ANGIOGRAM N/A 07/12/2014   Procedure: UNILATERAL UPPER Anselmo Rod;  Surgeon: Angelia Mould, MD;  Location: [facility] CATH LAB;  Service: Cardiovascular;  Laterality: N/A;    Family History  Problem Relation Age of Onset  . Aneurysm Mother        brain  . Heart disease Father   . Stroke Father   . Hypertension Father   . Diabetes Father   . Dementia Neg Hx   . Colon cancer Neg Hx   . Esophageal cancer Neg Hx   . Pancreatic cancer Neg Hx   . Prostate cancer Neg Hx   . Rectal cancer Neg Hx   . Stomach cancer Neg Hx     Social History:  reports that he quit smoking about 25 years ago. He has never used smokeless tobacco. He reports that he does not drink alcohol or use  drugs.    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 he is not a candidate for Forteo his nephrologist suggested Prolia He has had his first injection of Prolia in 5/19 and last injection was in 02/2019  Calcium has been variable, also followed by nephrologist  Is not taking any Sensipar  Lab Results  Component Value Date   PTH 154 (H) 10/06/2014   CALCIUM 10.6 (H) 06/16/2019   CAION 1.18 09/01/2019   PHOS 4.0 06/16/2019     Lipid history: He has been treated with Simvastatin 20 mg  by PCP, results as follows    Lab Results  Component Value Date   CHOL 104 04/02/2019   HDL 40 04/02/2019   LDLCALC 44 04/02/2019   TRIG 113 04/02/2019   CHOLHDL  2.6 04/02/2019          He has ESRD on dialysis    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  Is on 50 mcg levothyroxine with normal TSH levels, this has been prescribed by PCP  Lab Results  Component Value Date   TSH 1.95 09/15/2019      Physical Examination:  BP (!) 130/50 (BP Location: Left Arm, Patient Position: Sitting, Cuff Size: Normal)   Pulse 70   Ht 5' 11"  (1.803 m)   Wt 171 lb 9.6 oz (77.8 kg)   SpO2 97%   BMI 23.93 kg/m       ASSESSMENT:  Diabetes type 2  See history of present illness for discussion of his current management, blood sugar patterns and problems identified  He is being treated only with mealtime NovoLog and Tradjenta  A1c is relatively low compared to his home readings because of his renal failure anemia likely  Fructosamine is still relatively high indicating postprandial hyperglycemia He does not have any high readings in the mornings even without basal insulin However difficult to know how much insulin he should be taking especially at breakfast since he does not check any readings after meals Also not clear why blood sugars are higher in the afternoon consistently even without eating a full meal midday especially on dialysis days  His wife is asking about long-term effects of hyperglycemia on various organs and discussed possible complications including the fact that he has had some retinopathy    OSTEOPOROSIS with fracture: Needs continued Prolia and will verify his coverage before doing this   Vitamin D management will be deferred to nephrologist  Needs annual eye exam    PLAN:   Again discussed use of mealtime insulin to control postprandial readings He needs to start reducing monitoring in the morning at breakfast time and check readings at least 2 or 3 times a week after breakfast We will also need to check readings after dinner more often and periodically before  eating NovoLog will be adjusted to keep his readings from being over 180 after meals  Also may consider adding low-dose NPH in the morning which will improve his afternoon blood sugars To check readings 2 hours after breakfast periodically Does not need to check fasting readings every morning Regardless of blood sugar when he comes back from dialysis he will take the NovoLog only  right before suppertime On days he is eating lunch he will take 4 units of NovoLog before lunch also Encouraged him to eat lower fat meals especially at breakfast  Recheck fructosamine for assessment of his control on the next visit  Prolia injection will be given when approved by insurance  Patient Instructions  Check blood sugars on waking up 2-3  days a week  Also check blood sugars about 2 hours after meals and do this after different meals by rotation  Recommended blood sugar levels on waking up are 90-130 and about 2 hours after meal is 130-180  Please bring your blood sugar monitor to each visit, thank you     Counseling time on subjects discussed in assessment and plan sections is over 50% of today's 25 minute visit    [name] 09/22/2019, 8:43 PM   Note: This office note was prepared with Dragon voice recognition system technology. Any transcriptional errors that result from this process are unintentional.

## 2019-09-23 ENCOUNTER — Telehealth: Payer: Self-pay | Admitting: Endocrinology

## 2019-09-23 ENCOUNTER — Ambulatory Visit: Payer: Medicare Other

## 2019-09-23 NOTE — Telephone Encounter (Signed)
Called and left voicemail for wife with this info and requested that she return our call and confirm that she can bring the pt tomorrow.

## 2019-09-23 NOTE — Telephone Encounter (Signed)
Pt is clear for prolia no PA needed and no copay for year 2020.

## 2019-09-24 ENCOUNTER — Ambulatory Visit (INDEPENDENT_AMBULATORY_CARE_PROVIDER_SITE_OTHER): Payer: Self-pay | Admitting: Physician Assistant

## 2019-09-24 ENCOUNTER — Ambulatory Visit (INDEPENDENT_AMBULATORY_CARE_PROVIDER_SITE_OTHER): Payer: Medicare Other

## 2019-09-24 ENCOUNTER — Other Ambulatory Visit: Payer: Self-pay

## 2019-09-24 VITALS — BP 111/46 | HR 71 | Temp 98.2°F | Resp 16 | Ht 71.0 in | Wt 170.0 lb

## 2019-09-24 DIAGNOSIS — M810 Age-related osteoporosis without current pathological fracture: Secondary | ICD-10-CM

## 2019-09-24 DIAGNOSIS — Z992 Dependence on renal dialysis: Secondary | ICD-10-CM

## 2019-09-24 DIAGNOSIS — N186 End stage renal disease: Secondary | ICD-10-CM

## 2019-09-24 MED ORDER — DENOSUMAB 60 MG/ML ~~LOC~~ SOSY
60.0000 mg | PREFILLED_SYRINGE | Freq: Once | SUBCUTANEOUS | Status: AC
  Administered 2019-09-24: 60 mg via SUBCUTANEOUS

## 2019-09-24 NOTE — Progress Notes (Addendum)
POST OPERATIVE OFFICE NOTE    CC:  F/u for surgery  HPI:  This is a 78 y.o. male who is s/p Resection of ulcerated skin over the fistula and plication of cephalic vein fistula by Dr. Trula Slade on 09/01/19.  The patient presented to the office on 12/7 with ulceration over his fistula and possible infection.  Dr. Trula Slade felt that due to concerns over bleeding this needed to be addressed urgently.  He denise pain. Loss of motor and loss of sensation in the right UE.  HD clinic has been accessing his fistula above his incision  Allergies  Allergen Reactions  . Ambien [Zolpidem Tartrate] Other (See Comments)    hallucinations and "felt crazy"   . Penicillins Rash    Has patient had a PCN reaction causing immediate rash, facial/tongue/throat swelling, SOB or lightheadedness with hypotension: Yes Has patient had a PCN reaction causing severe rash involving mucus membranes or skin necrosis: Yes Has patient had a PCN reaction that required hospitalization: No Has patient had a PCN reaction occurring within the last 10 years: No If all of the above answers are "NO", then may proceed with Cephalosporin use.     Current Outpatient Medications  Medication Sig Dispense Refill  . acetaminophen (TYLENOL) 325 MG tablet Take 1-2 tablets (325-650 mg total) by mouth every 4 (four) hours as needed for mild pain.    Marland Kitchen allopurinol (ZYLOPRIM) 100 MG tablet Take 100 mg by mouth daily.     . Blood Glucose Monitoring Suppl (FREESTYLE FREEDOM LITE) w/Device KIT Use to check blood sugar 2 times per day dx code E11.65 1 each 0  . Calcium Carbonate Antacid (TUMS PO) Take by mouth.    . camphor-menthol (SARNA) lotion Apply 1 application topically as needed for itching.    . Carboxymethylcellulose Sod PF 0.25 % SOLN Place 1 drop into both eyes as needed (dry eyes).    . Cetirizine HCl (ZYRTEC PO) Take by mouth.    . clobetasol (TEMOVATE) 0.05 % external solution Apply 1 application topically 2 (two) times daily as  needed (irritation). Apply to scalp  0  . denosumab (PROLIA) 60 MG/ML SOSY injection Inject 60 mg into the skin every 6 (six) months.    . docusate sodium (COLACE) 100 MG capsule Take 1 capsule (100 mg total) by mouth daily. (Patient taking differently: Take 100 mg by mouth daily as needed for mild constipation. ) 30 capsule 0  . donepezil (ARICEPT) 10 MG tablet Take 10 mg by mouth 2 (two) times a day.     Marland Kitchen FLUOCINOLONE ACETONIDE SCALP 0.01 % OIL Apply 1 application topically at bedtime. Apply to scalp  0  . fluticasone (CUTIVATE) 0.05 % cream Apply 1 application topically 2 (two) times daily as needed (irritation). Apply to face  0  . fluticasone (FLONASE) 50 MCG/ACT nasal spray Place 1 spray into both nostrils daily. (Patient taking differently: Place 1 spray into both nostrils daily as needed for allergies. ) 16 g 2  . glucose blood (FREESTYLE LITE) test strip USE THREE TIMES DAILY 200 strip 2  . hydrocortisone 2.5 % cream Apply 1 application topically as needed for after feeds. Legs and arms    . insulin aspart (NOVOLOG FLEXPEN) 100 UNIT/ML FlexPen INJECT 6-8 UNITS UNDER THE SKIN THREE TIMES DAILY BEFORE MEALS. DX:E11.65 (Patient taking differently: Inject 4-6 Units into the skin 3 (three) times daily with meals. INJECT 4 Unit BEFORE BREAKFAST and lunch   Inject 6 UNITS BEFORE DINNER UNITS UNDER THE  SKIN THREE TIMES DAILY BEFORE MEALS. DX:E11.65) 15 mL 12  . Insulin Pen Needle (PEN NEEDLES) 32G X 4 MM MISC 1 each by Does not apply route 3 (three) times daily. USE PEN NEEDLES TO INJECT INSULIN THREE TIMES DAILY. 100 each 12  . ketoconazole (NIZORAL) 2 % shampoo Apply 1 application topically 2 (two) times a week. (Patient taking differently: Apply 1 application topically daily as needed for irritation. ) 120 mL 0  . Lancets (FREESTYLE) lancets Use as instructed to check blood sugar 2 times per day dx code E11.65 100 each 3  . levothyroxine (SYNTHROID, LEVOTHROID) 50 MCG tablet Take 1 tablet (50 mcg  total) by mouth daily. 90 tablet 1  . memantine (NAMENDA) 5 MG tablet Take 1 tablet (5 mg total) by mouth 2 (two) times daily. 60 tablet 6  . midodrine (PROAMATINE) 10 MG tablet Take one pill prior to hemodialysis on MWF (Patient taking differently: Take 10 mg by mouth every Monday, Wednesday, and Friday. Take one pill prior to hemodialysis)    . Naftifine HCl (NAFTIN) 2 % CREA Apply to feet daily for fungal infection (Patient taking differently: Apply 1 application topically daily as needed (for fungal infection). Apply to feet) 60 g prn  . Nutritional Supplements (FEEDING SUPPLEMENT, NEPRO CARB STEADY,) LIQD Take 237 mLs by mouth daily. Take 237 mg - 474 ml per day    . omeprazole (PRILOSEC) 40 MG capsule Take 1 capsule (40 mg total) by mouth daily. 30 capsule 0  . oxyCODONE-acetaminophen (PERCOCET/ROXICET) 5-325 MG tablet Take 1 tablet by mouth every 6 (six) hours as needed. 8 tablet 0  . polyethylene glycol (MIRALAX / GLYCOLAX) packet Take 17 g by mouth daily. (Patient taking differently: Take 17 g by mouth daily as needed for mild constipation. ) 14 each 0  . sevelamer carbonate (RENVELA) 800 MG tablet Take 1,600 mg by mouth 3 (three) times daily before meals.     . simvastatin (ZOCOR) 20 MG tablet Take 20 mg by mouth at bedtime.     . TRADJENTA 5 MG TABS tablet Take 1 tablet (5 mg total) by mouth daily. 90 tablet 2   No current facility-administered medications for this visit.     ROS:  See HPI  Physical Exam:  Vitals:   09/24/19 1439  Weight: 170 lb (77.1 kg)  Height: _0  (1.803 m)    Incision:  Well healed Extremities:  Good bruit and thrill in fistula. Hand is warm with AROM and 5/5 grip strength Neuro: A and O times 4    Assessment/Plan:  This is a 78 y.o. male who is s/p: plication of right brachiocephalic fistula due to persistant bleeding after dialysis.  Current HD treatment via fistula without complications. May access fistula carefully near operative site  January  6th. Sutures removed, dry dressing applied. Follow-up PRN      Risa Grill, PA-C [facility] [phone]  Clinic MD:  Oneida Alar

## 2019-09-24 NOTE — Progress Notes (Signed)
Per orders of Dr. Dwyane Dee injection of Prolia 60mg  given today by N.Latiqua Daloia,LPN. Injection Patient tolerated injection well.

## 2019-09-25 DIAGNOSIS — Z992 Dependence on renal dialysis: Secondary | ICD-10-CM | POA: Diagnosis not present

## 2019-09-25 DIAGNOSIS — E1122 Type 2 diabetes mellitus with diabetic chronic kidney disease: Secondary | ICD-10-CM | POA: Diagnosis not present

## 2019-09-25 DIAGNOSIS — N186 End stage renal disease: Secondary | ICD-10-CM | POA: Diagnosis not present

## 2019-09-26 DIAGNOSIS — N186 End stage renal disease: Secondary | ICD-10-CM | POA: Diagnosis not present

## 2019-09-26 DIAGNOSIS — E1129 Type 2 diabetes mellitus with other diabetic kidney complication: Secondary | ICD-10-CM | POA: Diagnosis not present

## 2019-09-26 DIAGNOSIS — Z992 Dependence on renal dialysis: Secondary | ICD-10-CM | POA: Diagnosis not present

## 2019-09-26 DIAGNOSIS — N2581 Secondary hyperparathyroidism of renal origin: Secondary | ICD-10-CM | POA: Diagnosis not present

## 2019-09-26 DIAGNOSIS — D631 Anemia in chronic kidney disease: Secondary | ICD-10-CM | POA: Diagnosis not present

## 2019-09-26 DIAGNOSIS — D509 Iron deficiency anemia, unspecified: Secondary | ICD-10-CM | POA: Diagnosis not present

## 2019-09-26 DIAGNOSIS — E119 Type 2 diabetes mellitus without complications: Secondary | ICD-10-CM | POA: Diagnosis not present

## 2019-09-28 DIAGNOSIS — E119 Type 2 diabetes mellitus without complications: Secondary | ICD-10-CM | POA: Diagnosis not present

## 2019-09-28 DIAGNOSIS — D509 Iron deficiency anemia, unspecified: Secondary | ICD-10-CM | POA: Diagnosis not present

## 2019-09-28 DIAGNOSIS — Z992 Dependence on renal dialysis: Secondary | ICD-10-CM | POA: Diagnosis not present

## 2019-09-28 DIAGNOSIS — N2581 Secondary hyperparathyroidism of renal origin: Secondary | ICD-10-CM | POA: Diagnosis not present

## 2019-09-28 DIAGNOSIS — N186 End stage renal disease: Secondary | ICD-10-CM | POA: Diagnosis not present

## 2019-09-28 DIAGNOSIS — E1129 Type 2 diabetes mellitus with other diabetic kidney complication: Secondary | ICD-10-CM | POA: Diagnosis not present

## 2019-09-30 DIAGNOSIS — E1129 Type 2 diabetes mellitus with other diabetic kidney complication: Secondary | ICD-10-CM | POA: Diagnosis not present

## 2019-09-30 DIAGNOSIS — Z992 Dependence on renal dialysis: Secondary | ICD-10-CM | POA: Diagnosis not present

## 2019-09-30 DIAGNOSIS — N186 End stage renal disease: Secondary | ICD-10-CM | POA: Diagnosis not present

## 2019-09-30 DIAGNOSIS — E119 Type 2 diabetes mellitus without complications: Secondary | ICD-10-CM | POA: Diagnosis not present

## 2019-09-30 DIAGNOSIS — N2581 Secondary hyperparathyroidism of renal origin: Secondary | ICD-10-CM | POA: Diagnosis not present

## 2019-09-30 DIAGNOSIS — D509 Iron deficiency anemia, unspecified: Secondary | ICD-10-CM | POA: Diagnosis not present

## 2019-10-02 DIAGNOSIS — N2581 Secondary hyperparathyroidism of renal origin: Secondary | ICD-10-CM | POA: Diagnosis not present

## 2019-10-02 DIAGNOSIS — N186 End stage renal disease: Secondary | ICD-10-CM | POA: Diagnosis not present

## 2019-10-02 DIAGNOSIS — E119 Type 2 diabetes mellitus without complications: Secondary | ICD-10-CM | POA: Diagnosis not present

## 2019-10-02 DIAGNOSIS — E1129 Type 2 diabetes mellitus with other diabetic kidney complication: Secondary | ICD-10-CM | POA: Diagnosis not present

## 2019-10-02 DIAGNOSIS — D509 Iron deficiency anemia, unspecified: Secondary | ICD-10-CM | POA: Diagnosis not present

## 2019-10-02 DIAGNOSIS — Z992 Dependence on renal dialysis: Secondary | ICD-10-CM | POA: Diagnosis not present

## 2019-10-05 DIAGNOSIS — D509 Iron deficiency anemia, unspecified: Secondary | ICD-10-CM | POA: Diagnosis not present

## 2019-10-05 DIAGNOSIS — E1129 Type 2 diabetes mellitus with other diabetic kidney complication: Secondary | ICD-10-CM | POA: Diagnosis not present

## 2019-10-05 DIAGNOSIS — N186 End stage renal disease: Secondary | ICD-10-CM | POA: Diagnosis not present

## 2019-10-05 DIAGNOSIS — N2581 Secondary hyperparathyroidism of renal origin: Secondary | ICD-10-CM | POA: Diagnosis not present

## 2019-10-05 DIAGNOSIS — Z992 Dependence on renal dialysis: Secondary | ICD-10-CM | POA: Diagnosis not present

## 2019-10-05 DIAGNOSIS — E119 Type 2 diabetes mellitus without complications: Secondary | ICD-10-CM | POA: Diagnosis not present

## 2019-10-06 ENCOUNTER — Other Ambulatory Visit: Payer: Self-pay

## 2019-10-06 ENCOUNTER — Ambulatory Visit
Admission: RE | Admit: 2019-10-06 | Discharge: 2019-10-06 | Disposition: A | Discharge status: Home / Self Care | Payer: Medicare Other | Source: Ambulatory Visit | Attending: Neurology | Admitting: Neurology

## 2019-10-06 DIAGNOSIS — S065X9A Traumatic subdural hemorrhage with loss of consciousness of unspecified duration, initial encounter: Secondary | ICD-10-CM | POA: Diagnosis not present

## 2019-10-06 DIAGNOSIS — S065XAA Traumatic subdural hemorrhage with loss of consciousness status unknown, initial encounter: Secondary | ICD-10-CM

## 2019-10-07 DIAGNOSIS — E1129 Type 2 diabetes mellitus with other diabetic kidney complication: Secondary | ICD-10-CM | POA: Diagnosis not present

## 2019-10-07 DIAGNOSIS — D509 Iron deficiency anemia, unspecified: Secondary | ICD-10-CM | POA: Diagnosis not present

## 2019-10-07 DIAGNOSIS — E119 Type 2 diabetes mellitus without complications: Secondary | ICD-10-CM | POA: Diagnosis not present

## 2019-10-07 DIAGNOSIS — Z992 Dependence on renal dialysis: Secondary | ICD-10-CM | POA: Diagnosis not present

## 2019-10-07 DIAGNOSIS — N186 End stage renal disease: Secondary | ICD-10-CM | POA: Diagnosis not present

## 2019-10-07 DIAGNOSIS — N2581 Secondary hyperparathyroidism of renal origin: Secondary | ICD-10-CM | POA: Diagnosis not present

## 2019-10-09 DIAGNOSIS — E119 Type 2 diabetes mellitus without complications: Secondary | ICD-10-CM | POA: Diagnosis not present

## 2019-10-09 DIAGNOSIS — E1129 Type 2 diabetes mellitus with other diabetic kidney complication: Secondary | ICD-10-CM | POA: Diagnosis not present

## 2019-10-09 DIAGNOSIS — Z992 Dependence on renal dialysis: Secondary | ICD-10-CM | POA: Diagnosis not present

## 2019-10-09 DIAGNOSIS — N2581 Secondary hyperparathyroidism of renal origin: Secondary | ICD-10-CM | POA: Diagnosis not present

## 2019-10-09 DIAGNOSIS — D509 Iron deficiency anemia, unspecified: Secondary | ICD-10-CM | POA: Diagnosis not present

## 2019-10-09 DIAGNOSIS — N186 End stage renal disease: Secondary | ICD-10-CM | POA: Diagnosis not present

## 2019-10-09 IMAGING — CT CT HEAD W/O CM
4 series · 16 of 47 positions shown, 18 images · non-contrast
Comparison: Prior CT from 07/20/2018.

CLINICAL DATA: Follow-up CT for bilateral subdural hematomas,
status post evacuation.

EXAM:
CT HEAD WITHOUT CONTRAST
TECHNIQUE: Contiguous axial images were obtained from the base of the skull
through the vertex without intravenous contrast.

[Series 3: head wo · axial · 0.41mm/px · z∈[-143,-23]mm · 7 of 33 slices shown, 9 images]
[im 5/33  brain]
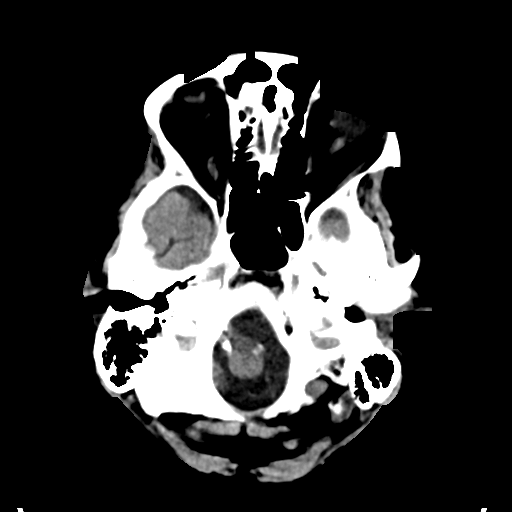
[im 5/33  bone]
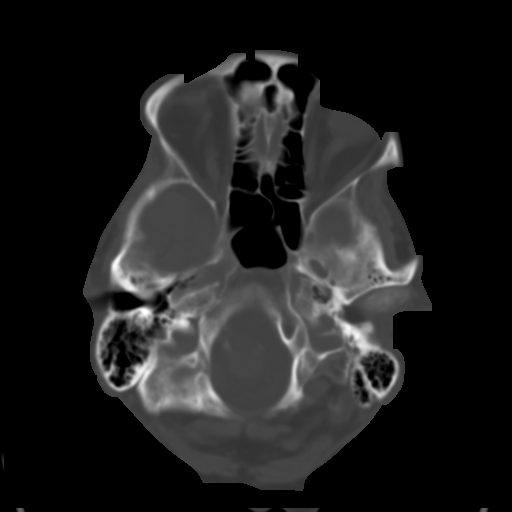
[im 9/33  brain]
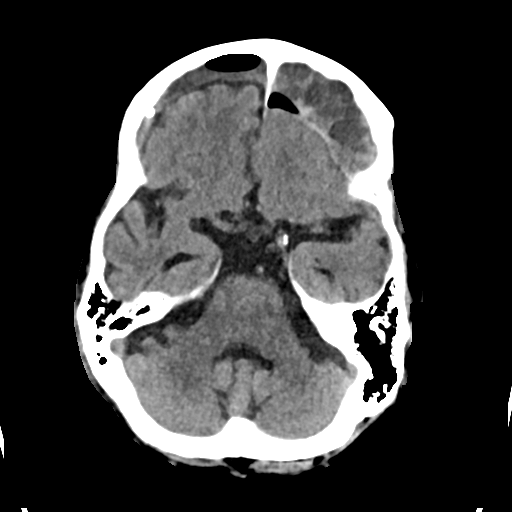
[im 13/33  brain]
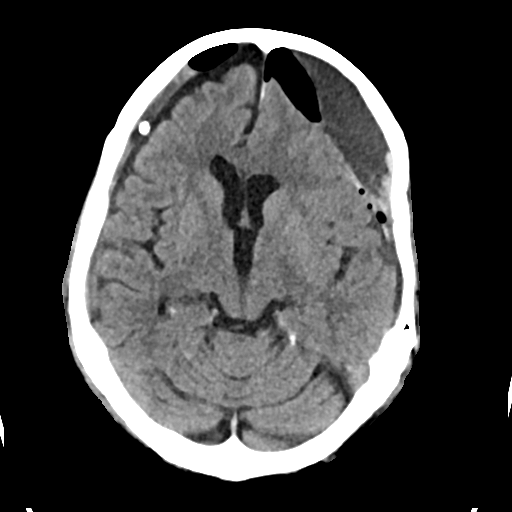
[im 17/33  brain]
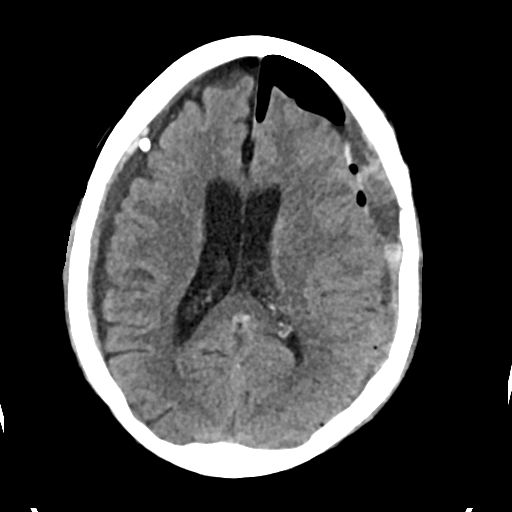
[im 21/33  brain]
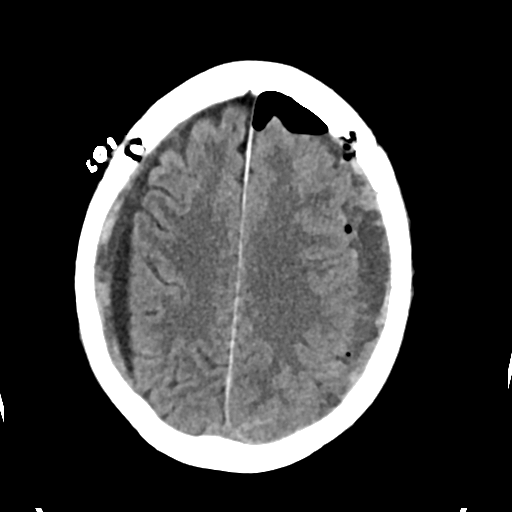
[im 21/33  bone]
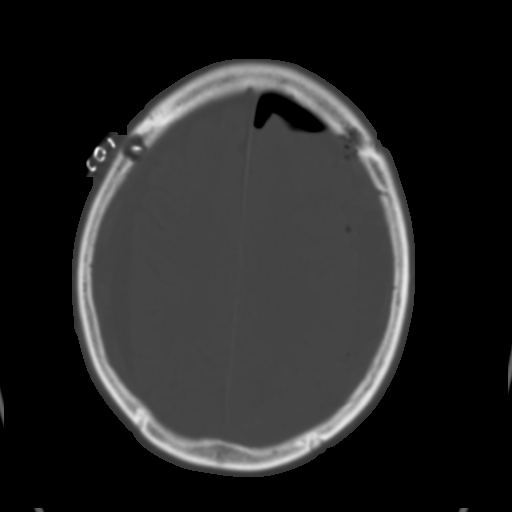
[im 25/33  brain]
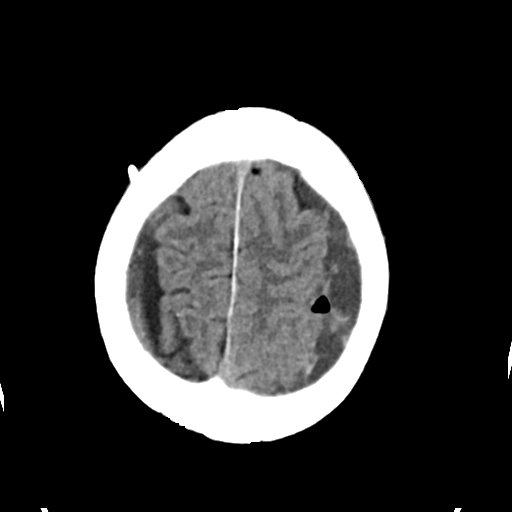
[im 29/33  brain]
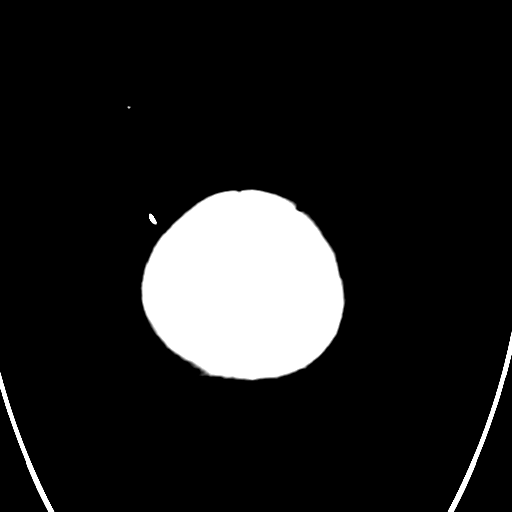

[Series 4: head bone · axial · 0.41mm/px · z∈[-147,-115]mm · 3 of 81 slices shown]
[im 9/81  bone]
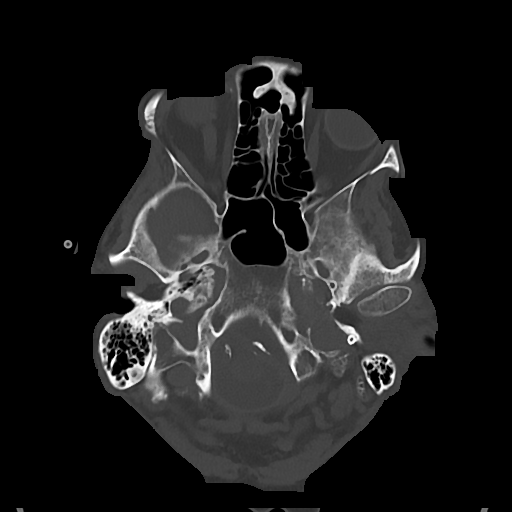
[im 17/81  bone]
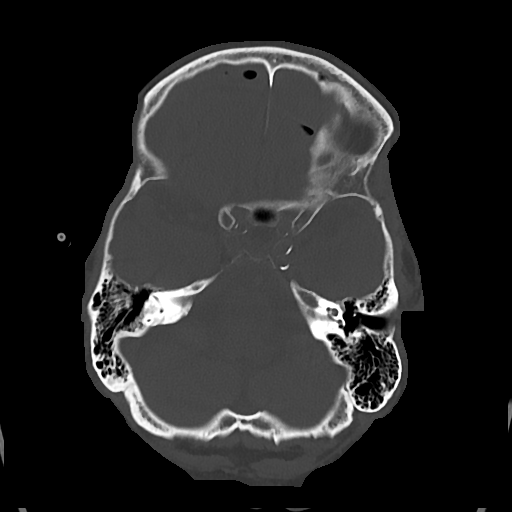
[im 25/81  bone]
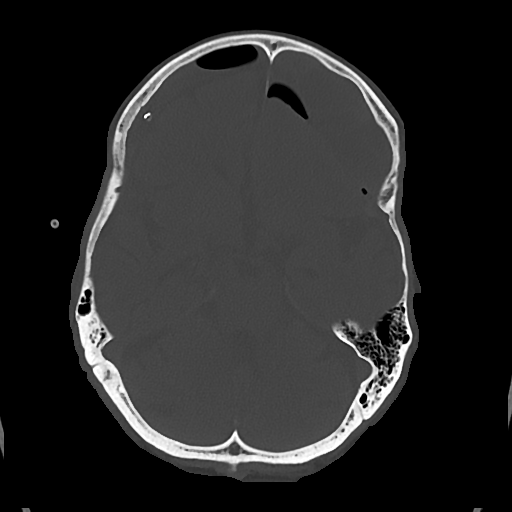

[Series 5: cor soft · coronal · 0.31mm/px · 3 of 67 slices shown]
[im 23/67  brain]
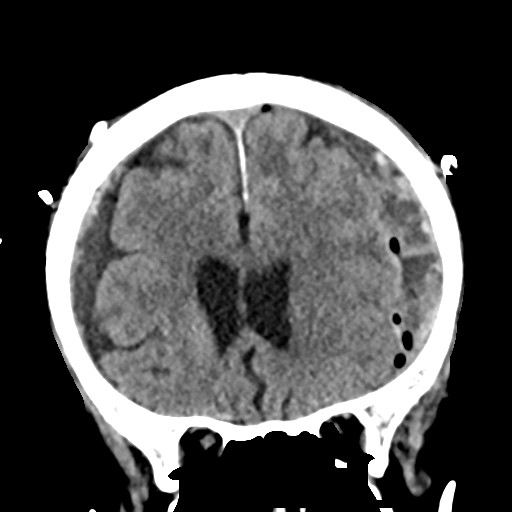
[im 30/67  brain]
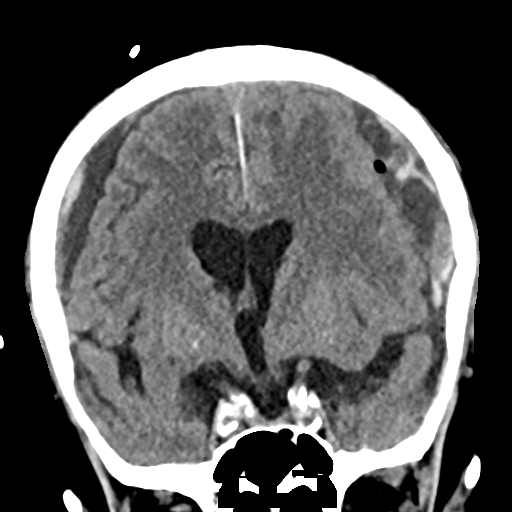
[im 37/67  brain]
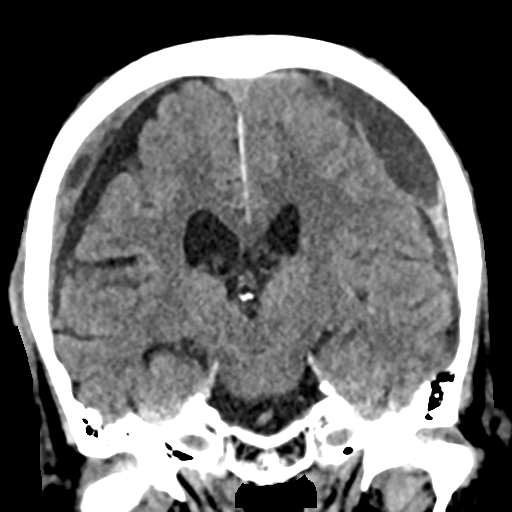

[Series 6: sag soft · sagittal · 0.31mm/px · 3 of 55 slices shown]
[im 19/55  brain]
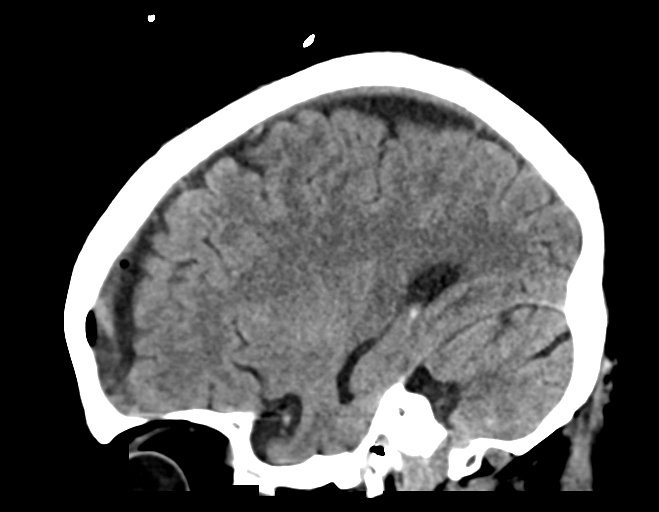
[im 28/55  brain]
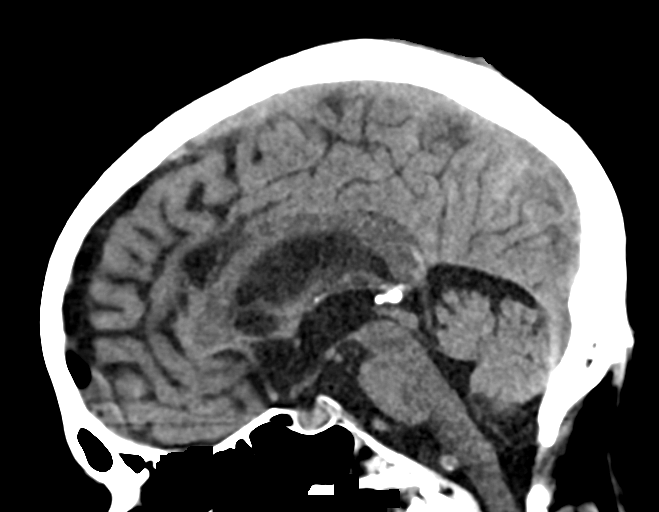
[im 37/55  brain]
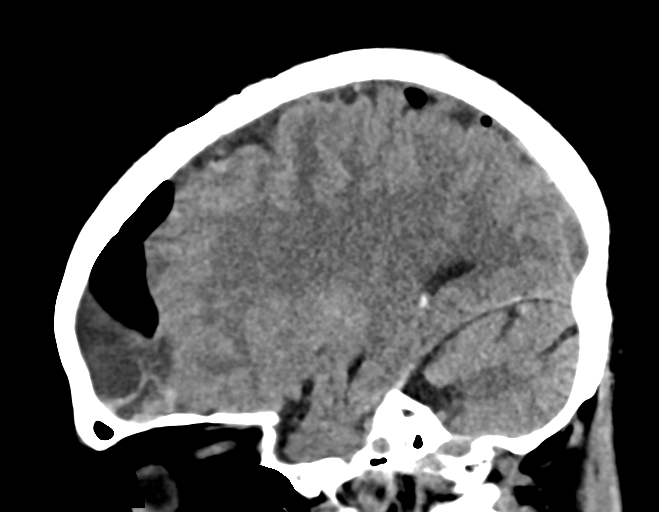

[16 of 47 positions shown; findings below may reference images not displayed]

FINDINGS: Brain: Fairly sizable mixed attenuation bilateral subdural hematomas
again seen. There has been performance of bilateral frontal burr
hole craniotomy for subdural evacuation. Subdural drain extends
through the right frontal burr hole with tip overlying the inferior
right frontal convexity. Scattered foci of extra-axial
pneumocephalus seen bilaterally, left greater than right. Since
previous exam, the right subdural collection is slightly decreased
in size now measuring 15 mm in maximal thickness, previously 18 mm.
The left subdural collection is relatively similar measuring 15 mm
in maximal thickness. Degree of hyperdense blood products within
both of these collections overall relatively similar. Associated
trace left-to-right shift relatively unchanged. Stable ventricular
size and morphology. No hydrocephalus or ventricular trapping.
Basilar cisterns remain patent.

Underlying atrophy with chronic small vessel ischemic disease again
noted. No other new acute intracranial hemorrhage. No acute large
vessel territory infarct.

Vascular: No hyperdense vessel. Scattered vascular calcifications
noted within the carotid siphons.

Skull: Postoperative changes from prior bifrontal burr hole
craniotomy. Skin staples remain in place.

Sinuses/Orbits: Globes and orbital soft tissues demonstrate no acute
finding. Visible paranasal sinuses and mastoid air cells are clear.

Other: None.
IMPRESSION: 1. Postoperative changes from interval bilateral frontal burr hole
craniotomy for subdural evacuation. Right-sided subdural drain in
place with tip overlying the anterior right frontal convexity. Mixed
attenuation bilateral subdural hematomas slightly decreased in size
on the right, and relatively similar in size on the left. No
significant midline shift or other complication.
2. No other new acute intracranial abnormality.

## 2019-10-12 DIAGNOSIS — N186 End stage renal disease: Secondary | ICD-10-CM | POA: Diagnosis not present

## 2019-10-12 DIAGNOSIS — E1129 Type 2 diabetes mellitus with other diabetic kidney complication: Secondary | ICD-10-CM | POA: Diagnosis not present

## 2019-10-12 DIAGNOSIS — N2581 Secondary hyperparathyroidism of renal origin: Secondary | ICD-10-CM | POA: Diagnosis not present

## 2019-10-12 DIAGNOSIS — Z992 Dependence on renal dialysis: Secondary | ICD-10-CM | POA: Diagnosis not present

## 2019-10-12 DIAGNOSIS — E119 Type 2 diabetes mellitus without complications: Secondary | ICD-10-CM | POA: Diagnosis not present

## 2019-10-12 DIAGNOSIS — D509 Iron deficiency anemia, unspecified: Secondary | ICD-10-CM | POA: Diagnosis not present

## 2019-10-14 DIAGNOSIS — E119 Type 2 diabetes mellitus without complications: Secondary | ICD-10-CM | POA: Diagnosis not present

## 2019-10-14 DIAGNOSIS — N2581 Secondary hyperparathyroidism of renal origin: Secondary | ICD-10-CM | POA: Diagnosis not present

## 2019-10-14 DIAGNOSIS — N186 End stage renal disease: Secondary | ICD-10-CM | POA: Diagnosis not present

## 2019-10-14 DIAGNOSIS — Z992 Dependence on renal dialysis: Secondary | ICD-10-CM | POA: Diagnosis not present

## 2019-10-14 DIAGNOSIS — D509 Iron deficiency anemia, unspecified: Secondary | ICD-10-CM | POA: Diagnosis not present

## 2019-10-14 DIAGNOSIS — E1129 Type 2 diabetes mellitus with other diabetic kidney complication: Secondary | ICD-10-CM | POA: Diagnosis not present

## 2019-10-16 DIAGNOSIS — E119 Type 2 diabetes mellitus without complications: Secondary | ICD-10-CM | POA: Diagnosis not present

## 2019-10-16 DIAGNOSIS — D509 Iron deficiency anemia, unspecified: Secondary | ICD-10-CM | POA: Diagnosis not present

## 2019-10-16 DIAGNOSIS — Z992 Dependence on renal dialysis: Secondary | ICD-10-CM | POA: Diagnosis not present

## 2019-10-16 DIAGNOSIS — N2581 Secondary hyperparathyroidism of renal origin: Secondary | ICD-10-CM | POA: Diagnosis not present

## 2019-10-16 DIAGNOSIS — E1129 Type 2 diabetes mellitus with other diabetic kidney complication: Secondary | ICD-10-CM | POA: Diagnosis not present

## 2019-10-16 DIAGNOSIS — N186 End stage renal disease: Secondary | ICD-10-CM | POA: Diagnosis not present

## 2019-10-19 DIAGNOSIS — N186 End stage renal disease: Secondary | ICD-10-CM | POA: Diagnosis not present

## 2019-10-19 DIAGNOSIS — Z992 Dependence on renal dialysis: Secondary | ICD-10-CM | POA: Diagnosis not present

## 2019-10-19 DIAGNOSIS — N2581 Secondary hyperparathyroidism of renal origin: Secondary | ICD-10-CM | POA: Diagnosis not present

## 2019-10-19 DIAGNOSIS — E1129 Type 2 diabetes mellitus with other diabetic kidney complication: Secondary | ICD-10-CM | POA: Diagnosis not present

## 2019-10-19 DIAGNOSIS — D509 Iron deficiency anemia, unspecified: Secondary | ICD-10-CM | POA: Diagnosis not present

## 2019-10-19 DIAGNOSIS — E119 Type 2 diabetes mellitus without complications: Secondary | ICD-10-CM | POA: Diagnosis not present

## 2019-10-21 DIAGNOSIS — N186 End stage renal disease: Secondary | ICD-10-CM | POA: Diagnosis not present

## 2019-10-21 DIAGNOSIS — N2581 Secondary hyperparathyroidism of renal origin: Secondary | ICD-10-CM | POA: Diagnosis not present

## 2019-10-21 DIAGNOSIS — D509 Iron deficiency anemia, unspecified: Secondary | ICD-10-CM | POA: Diagnosis not present

## 2019-10-21 DIAGNOSIS — E119 Type 2 diabetes mellitus without complications: Secondary | ICD-10-CM | POA: Diagnosis not present

## 2019-10-21 DIAGNOSIS — E1129 Type 2 diabetes mellitus with other diabetic kidney complication: Secondary | ICD-10-CM | POA: Diagnosis not present

## 2019-10-21 DIAGNOSIS — Z992 Dependence on renal dialysis: Secondary | ICD-10-CM | POA: Diagnosis not present

## 2019-10-23 DIAGNOSIS — D509 Iron deficiency anemia, unspecified: Secondary | ICD-10-CM | POA: Diagnosis not present

## 2019-10-23 DIAGNOSIS — Z992 Dependence on renal dialysis: Secondary | ICD-10-CM | POA: Diagnosis not present

## 2019-10-23 DIAGNOSIS — N186 End stage renal disease: Secondary | ICD-10-CM | POA: Diagnosis not present

## 2019-10-23 DIAGNOSIS — N2581 Secondary hyperparathyroidism of renal origin: Secondary | ICD-10-CM | POA: Diagnosis not present

## 2019-10-23 DIAGNOSIS — E1129 Type 2 diabetes mellitus with other diabetic kidney complication: Secondary | ICD-10-CM | POA: Diagnosis not present

## 2019-10-23 DIAGNOSIS — E119 Type 2 diabetes mellitus without complications: Secondary | ICD-10-CM | POA: Diagnosis not present

## 2019-10-26 DIAGNOSIS — D631 Anemia in chronic kidney disease: Secondary | ICD-10-CM | POA: Diagnosis not present

## 2019-10-26 DIAGNOSIS — D509 Iron deficiency anemia, unspecified: Secondary | ICD-10-CM | POA: Diagnosis not present

## 2019-10-26 DIAGNOSIS — N186 End stage renal disease: Secondary | ICD-10-CM | POA: Diagnosis not present

## 2019-10-26 DIAGNOSIS — E1122 Type 2 diabetes mellitus with diabetic chronic kidney disease: Secondary | ICD-10-CM | POA: Diagnosis not present

## 2019-10-26 DIAGNOSIS — N2581 Secondary hyperparathyroidism of renal origin: Secondary | ICD-10-CM | POA: Diagnosis not present

## 2019-10-26 DIAGNOSIS — E1129 Type 2 diabetes mellitus with other diabetic kidney complication: Secondary | ICD-10-CM | POA: Diagnosis not present

## 2019-10-26 DIAGNOSIS — Z992 Dependence on renal dialysis: Secondary | ICD-10-CM | POA: Diagnosis not present

## 2019-10-28 DIAGNOSIS — N2581 Secondary hyperparathyroidism of renal origin: Secondary | ICD-10-CM | POA: Diagnosis not present

## 2019-10-28 DIAGNOSIS — N186 End stage renal disease: Secondary | ICD-10-CM | POA: Diagnosis not present

## 2019-10-28 DIAGNOSIS — E1129 Type 2 diabetes mellitus with other diabetic kidney complication: Secondary | ICD-10-CM | POA: Diagnosis not present

## 2019-10-28 DIAGNOSIS — Z992 Dependence on renal dialysis: Secondary | ICD-10-CM | POA: Diagnosis not present

## 2019-10-28 DIAGNOSIS — D509 Iron deficiency anemia, unspecified: Secondary | ICD-10-CM | POA: Diagnosis not present

## 2019-10-30 DIAGNOSIS — Z992 Dependence on renal dialysis: Secondary | ICD-10-CM | POA: Diagnosis not present

## 2019-10-30 DIAGNOSIS — N186 End stage renal disease: Secondary | ICD-10-CM | POA: Diagnosis not present

## 2019-10-30 DIAGNOSIS — D509 Iron deficiency anemia, unspecified: Secondary | ICD-10-CM | POA: Diagnosis not present

## 2019-10-30 DIAGNOSIS — N2581 Secondary hyperparathyroidism of renal origin: Secondary | ICD-10-CM | POA: Diagnosis not present

## 2019-10-30 DIAGNOSIS — E1129 Type 2 diabetes mellitus with other diabetic kidney complication: Secondary | ICD-10-CM | POA: Diagnosis not present

## 2019-11-02 DIAGNOSIS — E1129 Type 2 diabetes mellitus with other diabetic kidney complication: Secondary | ICD-10-CM | POA: Diagnosis not present

## 2019-11-02 DIAGNOSIS — N2581 Secondary hyperparathyroidism of renal origin: Secondary | ICD-10-CM | POA: Diagnosis not present

## 2019-11-02 DIAGNOSIS — N186 End stage renal disease: Secondary | ICD-10-CM | POA: Diagnosis not present

## 2019-11-02 DIAGNOSIS — Z992 Dependence on renal dialysis: Secondary | ICD-10-CM | POA: Diagnosis not present

## 2019-11-02 DIAGNOSIS — D509 Iron deficiency anemia, unspecified: Secondary | ICD-10-CM | POA: Diagnosis not present

## 2019-11-04 DIAGNOSIS — E1129 Type 2 diabetes mellitus with other diabetic kidney complication: Secondary | ICD-10-CM | POA: Diagnosis not present

## 2019-11-04 DIAGNOSIS — D509 Iron deficiency anemia, unspecified: Secondary | ICD-10-CM | POA: Diagnosis not present

## 2019-11-04 DIAGNOSIS — N186 End stage renal disease: Secondary | ICD-10-CM | POA: Diagnosis not present

## 2019-11-04 DIAGNOSIS — N2581 Secondary hyperparathyroidism of renal origin: Secondary | ICD-10-CM | POA: Diagnosis not present

## 2019-11-04 DIAGNOSIS — Z992 Dependence on renal dialysis: Secondary | ICD-10-CM | POA: Diagnosis not present

## 2019-11-05 ENCOUNTER — Ambulatory Visit: Payer: Medicare Other | Admitting: Vascular Surgery

## 2019-11-05 ENCOUNTER — Encounter (HOSPITAL_COMMUNITY): Payer: Medicare Other

## 2019-11-06 DIAGNOSIS — E1129 Type 2 diabetes mellitus with other diabetic kidney complication: Secondary | ICD-10-CM | POA: Diagnosis not present

## 2019-11-06 DIAGNOSIS — Z992 Dependence on renal dialysis: Secondary | ICD-10-CM | POA: Diagnosis not present

## 2019-11-06 DIAGNOSIS — N2581 Secondary hyperparathyroidism of renal origin: Secondary | ICD-10-CM | POA: Diagnosis not present

## 2019-11-06 DIAGNOSIS — N186 End stage renal disease: Secondary | ICD-10-CM | POA: Diagnosis not present

## 2019-11-06 DIAGNOSIS — D509 Iron deficiency anemia, unspecified: Secondary | ICD-10-CM | POA: Diagnosis not present

## 2019-11-08 ENCOUNTER — Other Ambulatory Visit: Payer: Self-pay

## 2019-11-08 ENCOUNTER — Emergency Department (HOSPITAL_COMMUNITY): Payer: Medicare Other

## 2019-11-08 ENCOUNTER — Emergency Department (HOSPITAL_COMMUNITY)
Admission: EM | Admit: 2019-11-08 | Discharge: 2019-11-08 | Disposition: A | Discharge status: Home / Self Care | Payer: Medicare Other | Attending: Emergency Medicine | Admitting: Emergency Medicine

## 2019-11-08 ENCOUNTER — Telehealth: Payer: Self-pay | Admitting: Nurse Practitioner

## 2019-11-08 ENCOUNTER — Encounter (HOSPITAL_COMMUNITY): Payer: Self-pay | Admitting: Emergency Medicine

## 2019-11-08 DIAGNOSIS — Z79899 Other long term (current) drug therapy: Secondary | ICD-10-CM | POA: Insufficient documentation

## 2019-11-08 DIAGNOSIS — K921 Melena: Secondary | ICD-10-CM | POA: Diagnosis not present

## 2019-11-08 DIAGNOSIS — I120 Hypertensive chronic kidney disease with stage 5 chronic kidney disease or end stage renal disease: Secondary | ICD-10-CM | POA: Diagnosis not present

## 2019-11-08 DIAGNOSIS — Z794 Long term (current) use of insulin: Secondary | ICD-10-CM | POA: Insufficient documentation

## 2019-11-08 DIAGNOSIS — E1122 Type 2 diabetes mellitus with diabetic chronic kidney disease: Secondary | ICD-10-CM | POA: Insufficient documentation

## 2019-11-08 DIAGNOSIS — N186 End stage renal disease: Secondary | ICD-10-CM | POA: Insufficient documentation

## 2019-11-08 DIAGNOSIS — R109 Unspecified abdominal pain: Secondary | ICD-10-CM | POA: Diagnosis not present

## 2019-11-08 DIAGNOSIS — R1084 Generalized abdominal pain: Secondary | ICD-10-CM

## 2019-11-08 DIAGNOSIS — Z87891 Personal history of nicotine dependence: Secondary | ICD-10-CM | POA: Diagnosis not present

## 2019-11-08 DIAGNOSIS — F0390 Unspecified dementia without behavioral disturbance: Secondary | ICD-10-CM | POA: Diagnosis not present

## 2019-11-08 DIAGNOSIS — E039 Hypothyroidism, unspecified: Secondary | ICD-10-CM | POA: Diagnosis not present

## 2019-11-08 DIAGNOSIS — I2510 Atherosclerotic heart disease of native coronary artery without angina pectoris: Secondary | ICD-10-CM | POA: Insufficient documentation

## 2019-11-08 DIAGNOSIS — R079 Chest pain, unspecified: Secondary | ICD-10-CM | POA: Diagnosis not present

## 2019-11-08 DIAGNOSIS — Z992 Dependence on renal dialysis: Secondary | ICD-10-CM | POA: Insufficient documentation

## 2019-11-08 DIAGNOSIS — R531 Weakness: Secondary | ICD-10-CM | POA: Insufficient documentation

## 2019-11-08 LAB — CBC WITH DIFFERENTIAL/PLATELET
Abs Immature Granulocytes: 0.04 10*3/uL (ref 0.00–0.07)
Basophils Absolute: 0.1 10*3/uL (ref 0.0–0.1)
Basophils Relative: 1 %
Eosinophils Absolute: 0.5 10*3/uL (ref 0.0–0.5)
Eosinophils Relative: 6 %
HCT: 33 % — ABNORMAL LOW (ref 39.0–52.0)
Hemoglobin: 10.6 g/dL — ABNORMAL LOW (ref 13.0–17.0)
Immature Granulocytes: 1 %
Lymphocytes Relative: 13 %
Lymphs Abs: 1.1 10*3/uL (ref 0.7–4.0)
MCH: 34.5 pg — ABNORMAL HIGH (ref 26.0–34.0)
MCHC: 32.1 g/dL (ref 30.0–36.0)
MCV: 107.5 fL — ABNORMAL HIGH (ref 80.0–100.0)
Monocytes Absolute: 0.6 10*3/uL (ref 0.1–1.0)
Monocytes Relative: 8 %
Neutro Abs: 6.1 10*3/uL (ref 1.7–7.7)
Neutrophils Relative %: 71 %
Platelets: 172 10*3/uL (ref 150–400)
RBC: 3.07 MIL/uL — ABNORMAL LOW (ref 4.22–5.81)
RDW: 16.6 % — ABNORMAL HIGH (ref 11.5–15.5)
WBC: 8.5 10*3/uL (ref 4.0–10.5)
nRBC: 0 % (ref 0.0–0.2)

## 2019-11-08 LAB — COMPREHENSIVE METABOLIC PANEL
ALT: 14 U/L (ref 0–44)
AST: 34 U/L (ref 15–41)
Albumin: 2.9 g/dL — ABNORMAL LOW (ref 3.5–5.0)
Alkaline Phosphatase: 95 U/L (ref 38–126)
Anion gap: 20 — ABNORMAL HIGH (ref 5–15)
BUN: 49 mg/dL — ABNORMAL HIGH (ref 8–23)
CO2: 24 mmol/L (ref 22–32)
Calcium: 8.4 mg/dL — ABNORMAL LOW (ref 8.9–10.3)
Chloride: 93 mmol/L — ABNORMAL LOW (ref 98–111)
Creatinine, Ser: 9.19 mg/dL — ABNORMAL HIGH (ref 0.61–1.24)
GFR calc Af Amer: 6 mL/min — ABNORMAL LOW (ref >60)
GFR calc non Af Amer: 5 mL/min — ABNORMAL LOW (ref >60)
Glucose, Bld: 137 mg/dL — ABNORMAL HIGH (ref 70–99)
Potassium: 3.8 mmol/L (ref 3.5–5.1)
Sodium: 137 mmol/L (ref 135–145)
Total Bilirubin: 0.7 mg/dL (ref 0.3–1.2)
Total Protein: 6.1 g/dL — ABNORMAL LOW (ref 6.5–8.1)

## 2019-11-08 LAB — CBG MONITORING, ED: Glucose-Capillary: 142 mg/dL — ABNORMAL HIGH (ref 70–99)

## 2019-11-08 LAB — POC OCCULT BLOOD, ED: Fecal Occult Bld: NEGATIVE

## 2019-11-08 MED ORDER — ONDANSETRON HCL 4 MG/2ML IJ SOLN
4.0000 mg | Freq: Once | INTRAMUSCULAR | Status: AC
  Administered 2019-11-08: 4 mg via INTRAVENOUS
  Filled 2019-11-08: qty 2

## 2019-11-08 MED ORDER — FENTANYL CITRATE (PF) 100 MCG/2ML IJ SOLN
50.0000 ug | Freq: Once | INTRAMUSCULAR | Status: AC
  Administered 2019-11-08: 50 ug via INTRAVENOUS
  Filled 2019-11-08: qty 2

## 2019-11-08 MED ORDER — IOHEXOL 300 MG/ML  SOLN
100.0000 mL | Freq: Once | INTRAMUSCULAR | Status: AC | PRN
  Administered 2019-11-08: 100 mL via INTRAVENOUS

## 2019-11-08 NOTE — ED Provider Notes (Signed)
[facility] EMERGENCY DEPARTMENT Provider Note   CSN: [id number] Arrival date & time: 11/08/19  1507     History Chief Complaint  Patient presents with  . Blood In Stools  . Abdominal Pain    [name] is a 79 y.o. male ESRD (Monday, Wednesday, Friday dialysis), diabetes, GERD, hyperlipidemia, hypertension who presents for evaluation of intermittent episodes of black stools that began yesterday as well as some left lower quadrant abdominal pain.  Patient reports that yesterday, he had an a bowel movement that was very black in nature.  No bright red blood noted.  He states he had had an episode this morning where initially had little black specks in it and then turned more of a brown color.  He states he continues to have left lower quadrant abdominal pain which is what prompted his ED visit.  He has not had any nausea or vomiting.  He has not noted any fevers.  He states he has some mild shortness of breath which began yesterday.  He does state that he did dialysis on Friday did not miss any sessions.  He does report that he recently got his second Covid vaccine 2 days ago also and has felt generalized weakness from that.  He denies any chest pain, vomiting.  He does not think he is on any blood thinners.  The history is provided by the patient.       Past Medical History:  Diagnosis Date  . Allergy   . Anemia   . Arthritis   . Cataract    bil cateracts removed  . Coronary artery disease   . Dementia arising in the senium and presenium ([place])   . Diabetes mellitus    Type 2  . Diverticulitis   . ED (erectile dysfunction)   . Elevated homocysteine   . ESRD (end stage renal disease) on dialysis ([place]) 03/2015  . GERD (gastroesophageal reflux disease)    pepto   . Gout   . Hyperlipidemia   . Hypertension   . Hypothyroidism   . Pneumonia   . PVD (peripheral vascular disease) ([place])    has plastic aorta  . Seasonal allergies   . Sleep apnea    does not  wear c-pap    Patient Active Problem List   Diagnosis Date Noted  . Melena 11/19/2018  . Leukocytosis   . Anemia of chronic disease   . TBI (traumatic brain injury) ([place]) 07/29/2018  . 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 Dementia arising in the senium and presenium ([place])   . 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 Multiple fractures of ribs, left side, initial encounter for closed fracture 07/21/2018  . Subdural hematoma ([place]) 07/21/2018  . Angiodysplasia of stomach   . Occult GI bleeding   . GIB (gastrointestinal bleeding) 06/01/2018  . Low back pain 01/17/2018  . Anorexia   . Dyspnea   . Macrocytic anemia 06/04/2016  . Thrombocytopenia ([place]) 06/04/2016  . Exertional dyspnea 06/04/2016  . Arm paresthesia, left 06/04/2016  . Dyspnea on exertion 06/04/2016  . Tenderness of right calf 06/04/2016  . Mild cognitive impairment with memory loss 01/12/2016  . ESRD on dialysis ([place]) 05/17/2015  . Hypoglycemia   . Weakness 02/18/2015  . OSA (obstructive sleep apnea) 02/02/2015  . Hyperkalemia 10/05/2014  . End stage renal disease ([place]) 11/18/2013  . BPH (benign prostatic hyperplasia) 11/22/2012  . Peripheral vascular disease ([place]) 12/24/2011  . Hyperparathyroidism (  [place]) 12/24/2011  . Hypothyroidism 12/24/2011  . Allergic rhinitis 12/24/2011  . Erectile dysfunction 12/24/2011  . Insulin dependent diabetes mellitus 09/03/2008  . Hyperlipidemia 09/03/2008  . Essential hypertension 08/30/2008  . PANCREATITIS, HX OF 08/30/2008  . RENAL FAILURE, ACUTE, HX OF 08/30/2008  . DIVERTICULOSIS, COLON 06/08/2003    Past Surgical History:  Procedure Laterality Date  . aortobifemoral bypass    . AV FISTULA PLACEMENT Left 12/01/2013   Procedure: ARTERIOVENOUS (AV) FISTULA CREATION- LEFT BRACHIOCEPHALIC;  Surgeon: Angelia Mould, MD;  Location: [facility];  Service: Vascular;  Laterality: Left;  . [place] TRANSPOSITION Right 07/27/2014    Procedure: BASCILIC VEIN TRANSPOSITION;  Surgeon: Angelia Mould, MD;  Location: [facility];  Service: Vascular;  Laterality: Right;  . BRAIN SURGERY  07/22/2018  . BREAST SURGERY     left - granulomatous mastitis  . BURR HOLE Bilateral 07/22/2018   Procedure: BILATERAL BURR HOLES;  Surgeon: Kristeen Miss, MD;  Location: [facility];  Service: Neurosurgery;  Laterality: Bilateral;  . COLONOSCOPY    . 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 ENTEROSCOPY N/A 06/03/2018   Procedure: ENTEROSCOPY;  Surgeon: Lavena Bullion, DO;  Location: [facility];  Service: Gastroenterology;  Laterality: N/A;  . ENTEROSCOPY N/A 11/20/2018   Procedure: ENTEROSCOPY;  Surgeon: Rush Landmark Telford Nab., MD;  Location: [facility] ENDOSCOPY;  Service: Gastroenterology;  Laterality: N/A;  . EYE SURGERY Bilateral    cataracts  . HEMODIALYSIS INPATIENT  01/17/2018      . HOT HEMOSTASIS N/A 06/03/2018   Procedure: HOT HEMOSTASIS (ARGON PLASMA COAGULATION/BICAP);  Surgeon: Lavena Bullion, DO;  [facility];  Service: Gastroenterology;  Laterality: N/A;  . HOT HEMOSTASIS N/A 11/20/2018   Procedure: HOT HEMOSTASIS (ARGON PLASMA COAGULATION/BICAP);  Surgeon: Irving Copas., MD;  Location: [facility];  Service: Gastroenterology;  Laterality: N/A;  . REVISION OF ARTERIOVENOUS GORETEX GRAFT Right 09/01/2019   Procedure: REVISION OF ARTERIOVENOUS FISTULA RIGHT ARM;  Surgeon: Serafina Mitchell, MD;  Location: [facility] OR;  Service: Vascular;  Laterality: Right;  . REVISON OF ARTERIOVENOUS FISTULA Left 02/09/2014   Procedure: REVISON OF LEFT ARTERIOVENOUS FISTULA - RESECTION OF RENDUNDANT VEIN;  Surgeon: Angelia Mould, MD;  Location: [facility];  Service: Vascular;  Laterality: Left;  . SBO with lysis adhesions    . SHUNTOGRAM Left 04/19/2014   Procedure: FISTULOGRAM;  Surgeon: Angelia Mould, MD;  Location: [facility] CATH LAB;  Service: Cardiovascular;  Laterality: Left;  . UNILATERAL UPPER EXTREMEITY ANGIOGRAM  N/A 07/12/2014   Procedure: UNILATERAL UPPER Anselmo Rod;  Surgeon: Angelia Mould, MD;  Location: [facility] CATH LAB;  Service: Cardiovascular;  Laterality: N/A;       Family History  Problem Relation Age of Onset  . Aneurysm Mother        brain  . Heart disease Father   . Stroke Father   . Hypertension Father   . Diabetes Father   . Dementia Neg Hx   . Colon cancer Neg Hx   . Esophageal cancer Neg Hx   . Pancreatic cancer Neg Hx   . Prostate cancer Neg Hx   . Rectal cancer Neg Hx   . Stomach cancer Neg Hx     Social History   Tobacco Use  . Smoking status: Former Smoker    Quit date: 09/24/1994    Years since quitting: 25.1  . Smokeless tobacco: Never Used  Substance Use Topics  . Alcohol use: No  Alcohol/week: 0.0 standard drinks    Comment: Quit Oct. 1977 ("somewhat heavy")  . Drug use: No    Home Medications Prior to Admission medications   Medication Sig Start Date End Date Taking? Authorizing Provider  acetaminophen (TYLENOL) 325 MG tablet Take 1-2 tablets (325-650 mg total) by mouth every 4 (four) hours as needed for mild pain. 08/01/18  Yes Love, Ivan Anchors, PA-C  allopurinol (ZYLOPRIM) 100 MG tablet Take 100 mg by mouth daily.    Yes [provider]  Blood Glucose Monitoring Suppl (FREESTYLE FREEDOM LITE) w/Device KIT Use to check blood sugar 2 times per day dx code E11.65 10/21/18  Yes Elayne Snare, MD  Calcium Carbonate Antacid (TUMS PO) Take 2 tablets by mouth at bedtime.    Yes [provider]  camphor-menthol Timoteo Ace) lotion Apply 1 application topically as needed for itching.   Yes [provider]  Carboxymethylcellulose Sod PF 0.25 % SOLN Place 1 drop into both eyes as needed (dry eyes).   Yes [provider]  cetirizine (ZYRTEC) 10 MG chewable tablet Chew 10 mg by mouth daily.   Yes [provider]  clobetasol (TEMOVATE) 0.05 % external solution Apply 1 application topically 2 (two) times daily as needed  (irritation). Apply to scalp 04/19/15  Yes [provider]  denosumab (PROLIA) 60 MG/ML SOSY injection Inject 60 mg into the skin every 6 (six) months.   Yes [provider]  donepezil (ARICEPT) 10 MG tablet Take 10 mg by mouth at bedtime.  01/13/19  Yes [provider]  FLUOCINOLONE ACETONIDE SCALP 0.01 % OIL Apply 1 application topically at bedtime. Apply to scalp 04/28/15  Yes [provider]  fluticasone (CUTIVATE) 0.05 % cream Apply 1 application topically 2 (two) times daily as needed (irritation). Apply to face 04/19/15  Yes [provider]  fluticasone (FLONASE) 50 MCG/ACT nasal spray Place 1 spray into both nostrils daily. Patient taking differently: Place 1 spray into both nostrils daily as needed for allergies.  10/08/14  Yes Mikhail, [place], DO  glucose blood (FREESTYLE LITE) test strip USE THREE TIMES DAILY 05/26/19  Yes Elayne Snare, MD  hydrocortisone 2.5 % cream Apply 1 application topically as needed for after feeds. Legs and arms 01/13/19  Yes [provider]  insulin aspart (NOVOLOG FLEXPEN) 100 UNIT/ML FlexPen INJECT 6-8 UNITS UNDER THE SKIN THREE TIMES DAILY BEFORE MEALS. DX:E11.65 Patient taking differently: Inject 4-6 Units into the skin 3 (three) times daily with meals. INJECT 4 Unit BEFORE BREAKFAST and lunch   Inject 6 UNITS BEFORE DINNER UNITS UNDER THE SKIN THREE TIMES DAILY BEFORE MEALS. DX:E11.65 11/25/18  Yes Elayne Snare, MD  Insulin Pen Needle (PEN NEEDLES) 32G X 4 MM MISC 1 each by Does not apply route 3 (three) times daily. USE PEN NEEDLES TO INJECT INSULIN THREE TIMES DAILY. 11/25/18  Yes Elayne Snare, MD  ketoconazole (NIZORAL) 2 % shampoo Apply 1 application topically 2 (two) times a week. Patient taking differently: Apply 1 application topically daily as needed for irritation.  03/31/15  Yes BaxleyCresenciano Lick, MD  Lancets (FREESTYLE) lancets Use as instructed to check blood sugar 2 times per day dx code E11.65 02/22/16  Yes Elayne Snare, MD  levothyroxine (SYNTHROID, LEVOTHROID) 50 MCG tablet Take 1 tablet (50 mcg total) by mouth daily. 08/06/14  Yes [place]ley, Cresenciano Lick, MD  memantine (NAMENDA) 5 MG tablet Take 1 tablet (5 mg total) by mouth 2 (two) times daily. 07/30/19  Yes Melvenia Beam, MD  midodrine (PROAMATINE) 10 MG tablet Take one pill prior to hemodialysis on MWF Patient taking differently: Take 10 mg by mouth every Monday, Wednesday, and Friday. Take one pill prior to hemodialysis 08/13/18  Yes Love, Ivan Anchors, PA-C  Naftifine HCl (NAFTIN) 2 % CREA Apply to feet daily for fungal infection Patient taking differently: Apply 1 application topically daily as needed (for fungal infection). Apply to feet 09/12/15  Yes [place], Cresenciano Lick, MD  Nutritional Supplements (FEEDING SUPPLEMENT, NEPRO CARB STEADY,) LIQD Take 237 mLs by mouth daily. Take 237 mg - 474 ml per day   Yes [provider]  omeprazole (PRILOSEC) 40 MG capsule Take 1 capsule (40 mg total) by mouth daily. 03/13/18  Yes [place], Amy S, PA-C  polyethylene glycol (MIRALAX / GLYCOLAX) packet Take 17 g by mouth daily. Patient taking differently: Take 17 g by mouth daily as needed for mild constipation.  08/13/18  Yes Love, Ivan Anchors, PA-C  sevelamer carbonate (RENVELA) 800 MG tablet Take 1,600 mg by mouth 3 (three) times daily before meals.    Yes [provider]  simvastatin (ZOCOR) 20 MG tablet Take 20 mg by mouth at bedtime.    Yes [provider]  TRADJENTA 5 MG TABS tablet Take 1 tablet (5 mg total) by mouth daily. 06/22/19  Yes Elayne Snare, MD  docusate sodium (COLACE) 100 MG capsule Take 1 capsule (100 mg total) by mouth daily. Patient not taking: Reported on 11/08/2019 08/13/18   Bary Leriche, PA-C    Allergies    Ambien [zolpidem tartrate] and Penicillins  Review of Systems   Review of Systems  Constitutional: Negative for fever.  Respiratory: Positive for shortness of breath. Negative for cough.   Cardiovascular: Negative for  chest pain.  Gastrointestinal: Positive for abdominal pain and blood in stool. Negative for nausea and vomiting.  Genitourinary: Negative for dysuria and hematuria.  Neurological: Positive for weakness (Generalized). Negative for headaches.  All other systems reviewed and are negative.   Physical Exam Updated Vital Signs BP 131/80   Pulse 76   Temp 97.9 F (36.6 C) (Oral)   Resp 17   Ht 5' 11"  (1.803 m)   SpO2 99%   BMI 23.71 kg/m   Physical Exam Vitals and nursing note reviewed.  Constitutional:      Appearance: Normal appearance. He is well-developed.  HENT:     Head: Normocephalic and atraumatic.  Eyes:     General: Lids are normal.     Conjunctiva/sclera: Conjunctivae normal.     Pupils: Pupils are equal, round, and reactive to light.  Cardiovascular:     Rate and Rhythm: Normal rate and regular rhythm.     Pulses: Normal pulses.     Heart sounds: Normal heart sounds. No murmur. No friction rub. No gallop.   Pulmonary:     Effort: Pulmonary effort is normal.     Breath sounds: Normal breath sounds.     Comments: Lungs clear to auscultation bilaterally.  Symmetric chest rise.  No wheezing, rales, rhonchi. Abdominal:     Palpations: Abdomen is soft. Abdomen is not rigid.     Tenderness: There is abdominal tenderness in the left lower quadrant. There is no guarding.     Comments: Abdomen soft, nondistended.  Tenderness noted to left lower quadrant.  No rigidity, guarding.  Genitourinary:    Comments: The exam was performed with a chaperone present. Digital Rectal Exam reveals sphincter with good tone. No external hemorrhoids. No masses or fissures. Stool  color is brown with no overt blood. Musculoskeletal:        General: Normal range of motion.     Cervical back: Full passive range of motion without pain.  Skin:    General: Skin is warm and dry.     Capillary Refill: Capillary refill takes less than 2 seconds.  Neurological:     Mental Status: He is alert and  oriented to person, place, and time.  Psychiatric:        Speech: Speech normal.     ED Results / Procedures / Treatments   Labs (all labs ordered are listed, but only abnormal results are displayed) Labs Reviewed  COMPREHENSIVE METABOLIC PANEL - Abnormal; Notable for the following components:      Result Value   Chloride 93 (*)    Glucose, Bld 137 (*)    BUN 49 (*)    Creatinine, Ser 9.19 (*)    Calcium 8.4 (*)    Total Protein 6.1 (*)    Albumin 2.9 (*)    GFR calc non Af Amer 5 (*)    GFR calc Af Amer 6 (*)    Anion gap 20 (*)    All other components within normal limits  CBC WITH DIFFERENTIAL/PLATELET - Abnormal; Notable for the following components:   RBC 3.07 (*)    Hemoglobin 10.6 (*)    HCT 33.0 (*)    MCV 107.5 (*)    MCH 34.5 (*)    RDW 16.6 (*)    All other components within normal limits  CBG MONITORING, ED - Abnormal; Notable for the following components:   Glucose-Capillary 142 (*)    All other components within normal limits  POC OCCULT BLOOD, ED    EKG None  Radiology CT ABDOMEN PELVIS W CONTRAST  Result Date: 11/08/2019 CLINICAL DATA:  Abdominal pain, blood in stool EXAM: CT ABDOMEN AND PELVIS WITH CONTRAST TECHNIQUE: Multidetector CT imaging of the abdomen and pelvis was performed using the standard protocol following bolus administration of intravenous contrast. CONTRAST:  137m OMNIPAQUE IOHEXOL 300 MG/ML  SOLN COMPARISON:  11/19/2018 FINDINGS: Lower chest: No acute pleural or parenchymal lung disease. Hepatobiliary: Numerous small hypodensities within the liver parenchyma are stable and likely reflect small cysts. The gallbladder is unremarkable. Pancreas: Unremarkable. No pancreatic ductal dilatation or surrounding inflammatory changes. Spleen: Normal in size without focal abnormality. Adrenals/Urinary Tract: The adrenals are unremarkable. There is extensive bilateral renal cortical scarring and atrophy consistent with chronic renal insufficiency.  Bilateral renal cysts are unchanged. No obstructive uropathy. The bladder is decompressed. Stomach/Bowel: No bowel obstruction or ileus. Normal appendix right lower quadrant. Scattered sigmoid diverticulosis without diverticulitis. Vascular/Lymphatic: Postsurgical changes are seen from aortobifemoral bypass. Extensive atherosclerosis within the native vessels. Bypass graft is patent. No pathologic adenopathy. Reproductive: Prostate is unremarkable. Other: No abdominal wall hernia or abnormality. No abdominopelvic ascites. Postsurgical changes are seen from prior midline laparotomy. Musculoskeletal: No acute or destructive bony lesions. Chronic compression deformities are seen throughout the lumbar spine, stable. Prior vertebral augmentation at L3 unchanged. Reconstructed images demonstrate no additional findings. IMPRESSION: 1. Sequela of chronic renal insufficiency. 2. Postsurgical changes from aortobifemoral bypass. Bypass graft is patent. 3. Numerous chronic lumbar compression deformities, stable. 4. No acute intra-abdominal or intrapelvic process. No etiology to explain the patient's symptoms. Electronically Signed   By: MRanda NgoM.D.   On: 11/08/2019 21:06   DG Chest Portable 1 View  Result Date: 11/08/2019 CLINICAL DATA:  Chest pain and abdominal pain for 1  week. Shortness of breath. EXAM: PORTABLE CHEST 1 VIEW COMPARISON:  Chest x-ray dated 03/11/2019. FINDINGS: Heart size and mediastinal contours are within normal limits. Aortic atherosclerosis. Lungs are clear. No pleural effusion or pneumothorax is seen. Osseous structures about the chest are unremarkable. IMPRESSION: 1. No active disease. No evidence of pneumonia or pulmonary edema. 2. Aortic atherosclerosis. Electronically Signed   By: Franki Cabot M.D.   On: 11/08/2019 16:11    Procedures Procedures (including critical care time)  Medications Ordered in ED Medications  fentaNYL (SUBLIMAZE) injection 50 mcg (50 mcg Intravenous Given  11/08/19 2000)  ondansetron (ZOFRAN) injection 4 mg (4 mg Intravenous Given 11/08/19 2000)  iohexol (OMNIPAQUE) 300 MG/ML solution 100 mL (100 mLs Intravenous Contrast Given 11/08/19 2049)    ED Course  I have reviewed the triage vital signs and the nursing notes.  Pertinent labs & imaging results that were available during my care of the patient were reviewed by me and considered in my medical decision making (see chart for details).    MDM Rules/Calculators/A&P                      79 year old male with past medical history of end-stage renal disease, GI bleed, hypertension, hyperlipidemia who presents for evaluation of black stools that began yesterday.  States it is improved but still had some small amounts of black which prompted ED visit.  He also reports he is having left lower quadrant abdominal pain which began yesterday.  No nausea/vomiting, fevers.  Does report that he got dialysis on Friday but does feel like he is having little bit worsening shortness of breath.  Also reports generalized weakness that began after getting his Covid vaccine 2 days ago.  Initially arrival, he is afebrile, nontoxic-appearing.  Vital signs are stable.  On exam, he has tenderness palpation of the left lower quadrant.  No rigidity, guarding.  On digital rectal exam, he had brown stool with no overt blood.  Consider GI bleed versus diverticulitis.  Review of patient records show the patient with history of both upper and lower GI bleeds.  He had endoscopy done in June 2020 which revealed multiple AVMs.  Additionally, he had a colonoscopy done 2019 that showed AVMs that they were suspected was the cause of his bleeding.  He has been followed by Dr. Silverio Decamp ([facility]).   CBC shows no leukocytosis.  Hemoglobin stable at 10.6.  His fecal occult is negative. CMP whos normal potassium. Cr and BUN consistent with ESRD. Will proceed with CT abd/pelvis to ensure this is not acute diverticulitis.   Patient signed out  to [facility] with CT pending. Anticipate discharge home.   Portions of this note were generated with [employer]. Dictation errors may occur despite best attempts at proofreading.  Final Clinical Impression(s) / ED Diagnoses Final diagnoses:  Blood in stool  Generalized abdominal pain    Rx / DC Orders ED Discharge Orders    None       Desma [name] 11/08/19 2339    Little, Wenda Overland, MD 11/10/19 1352

## 2019-11-08 NOTE — ED Triage Notes (Signed)
Pt reports blood in his stools for 2 days. Endorses left sided abd pain since yesterday and back pain. Last HD was Friday.  Wife stated to call her to go over pts medications.

## 2019-11-08 NOTE — Telephone Encounter (Signed)
Followed by Dr. Silverio Decamp. Patient with multiple co-morbidities including HTN, ESRD on HD, PVD, and DM. Hx of GI bleeds / gastric intestinal AVMs.  VCE Feb 11/21/19   Wife called, having left lower abdominal pain for 1.5 weeks. Had a black tarry stool yesterday. Nauseated, poor appetite. Doesn't feel fell. Has dialysis in am. Worsening SOB over last couple of weeks.   Wife taking him to ED.

## 2019-11-08 NOTE — ED Provider Notes (Signed)
[name] presents today for evaluation of abdominal pain and blood in his stool.  I assumed care of patient from previous team, please see their note for full H&P.  Physical Exam  BP (!) 136/50 (BP Location: Left Arm)   Pulse 72   Temp 97.9 F (36.6 C) (Oral)   Resp 16   Ht 5\' 11"  (1.803 m)   SpO2 100%   BMI 23.71 kg/m   Physical Exam Vitals and nursing note reviewed.  Constitutional:      General: He is not in acute distress.    Appearance: He is not ill-appearing.  Neurological:     Mental Status: He is alert.     ED Course/Procedures     Procedures  CT ABDOMEN PELVIS W CONTRAST  Result Date: 11/08/2019 CLINICAL DATA:  Abdominal pain, blood in stool EXAM: CT ABDOMEN AND PELVIS WITH CONTRAST TECHNIQUE: Multidetector CT imaging of the abdomen and pelvis was performed using the standard protocol following bolus administration of intravenous contrast. CONTRAST:  180mL OMNIPAQUE IOHEXOL 300 MG/ML  SOLN COMPARISON:  11/19/2018 FINDINGS: Lower chest: No acute pleural or parenchymal lung disease. Hepatobiliary: Numerous small hypodensities within the liver parenchyma are stable and likely reflect small cysts. The gallbladder is unremarkable. Pancreas: Unremarkable. No pancreatic ductal dilatation or surrounding inflammatory changes. Spleen: Normal in size without focal abnormality. Adrenals/Urinary Tract: The adrenals are unremarkable. There is extensive bilateral renal cortical scarring and atrophy consistent with chronic renal insufficiency. Bilateral renal cysts are unchanged. No obstructive uropathy. The bladder is decompressed. Stomach/Bowel: No bowel obstruction or ileus. Normal appendix right lower quadrant. Scattered sigmoid diverticulosis without diverticulitis. Vascular/Lymphatic: Postsurgical changes are seen from aortobifemoral bypass. Extensive atherosclerosis within the native vessels. Bypass graft is patent. No pathologic adenopathy. Reproductive: Prostate is  unremarkable. Other: No abdominal wall hernia or abnormality. No abdominopelvic ascites. Postsurgical changes are seen from prior midline laparotomy. Musculoskeletal: No acute or destructive bony lesions. Chronic compression deformities are seen throughout the lumbar spine, stable. Prior vertebral augmentation at L3 unchanged. Reconstructed images demonstrate no additional findings. IMPRESSION: 1. Sequela of chronic renal insufficiency. 2. Postsurgical changes from aortobifemoral bypass. Bypass graft is patent. 3. Numerous chronic lumbar compression deformities, stable. 4. No acute intra-abdominal or intrapelvic process. No etiology to explain the patient's symptoms. Electronically Signed   By: Randa Ngo M.D.   On: 11/08/2019 21:06   DG Chest Portable 1 View  Result Date: 11/08/2019 CLINICAL DATA:  Chest pain and abdominal pain for 1 week. Shortness of breath. EXAM: PORTABLE CHEST 1 VIEW COMPARISON:  Chest x-ray dated 03/11/2019. FINDINGS: Heart size and mediastinal contours are within normal limits. Aortic atherosclerosis. Lungs are clear. No pleural effusion or pneumothorax is seen. Osseous structures about the chest are unremarkable. IMPRESSION: 1. No active disease. No evidence of pneumonia or pulmonary edema. 2. Aortic atherosclerosis. Electronically Signed   By: Franki Cabot M.D.   On: 11/08/2019 16:11     MDM   Plan is to follow-up on CT scan.  If no evidence of diverticulitis none discharge home with GI follow-up.  CT scan without evidence of diverticulitis or other cause for patient's symptoms.  Per plan established by previous team patient will be discharged with outpatient GI follow-up.    Return precautions were discussed with patient who states their understanding.  At the time of discharge patient denied any unaddressed complaints or concerns.  Patient is agreeable for discharge home.  Note: Portions of this report may have been transcribed using voice recognition software.  Every effort was made to ensure accuracy; however, inadvertent computerized transcription errors may be present       Ollen Gross 11/08/19 2243    Little, Wenda Overland, MD 11/10/19 1355

## 2019-11-08 NOTE — Telephone Encounter (Signed)
Understood, thanks.  Expect ED will call after evaluation.  I've copied this to Dr. Rush Landmark (call tonight) and Dr. Ardis Hughs (hospital consult doc tomorrow)  - HD

## 2019-11-08 NOTE — Discharge Instructions (Signed)
Please call your GI doctor to set up an appointment.

## 2019-11-08 NOTE — ED Notes (Signed)
Called wife, she will get nephew to come pick him up.

## 2019-11-09 DIAGNOSIS — N186 End stage renal disease: Secondary | ICD-10-CM | POA: Diagnosis not present

## 2019-11-09 DIAGNOSIS — Z992 Dependence on renal dialysis: Secondary | ICD-10-CM | POA: Diagnosis not present

## 2019-11-09 DIAGNOSIS — N2581 Secondary hyperparathyroidism of renal origin: Secondary | ICD-10-CM | POA: Diagnosis not present

## 2019-11-09 DIAGNOSIS — D509 Iron deficiency anemia, unspecified: Secondary | ICD-10-CM | POA: Diagnosis not present

## 2019-11-09 DIAGNOSIS — E1129 Type 2 diabetes mellitus with other diabetic kidney complication: Secondary | ICD-10-CM | POA: Diagnosis not present

## 2019-11-09 NOTE — Telephone Encounter (Signed)
FYI, I never heard about this patient last night. Looks like he got a CT scan and was discharged because he did not have diverticulitis.   KVD, would you be able to set up follow up for your patient? Thanks. GM

## 2019-11-09 NOTE — ED Notes (Signed)
50 mg of fentanyl wasted. Hipolito Bayley, RN witnessed

## 2019-11-10 NOTE — Telephone Encounter (Signed)
Thanks Gabe. He is followed by Luanna Salk, I will forward the message to him

## 2019-11-10 NOTE — Telephone Encounter (Signed)
noted 

## 2019-11-10 NOTE — Telephone Encounter (Signed)
I saw that message from [facility] and he is set up for f/u in the [facility]. Thanks!

## 2019-11-11 DIAGNOSIS — N186 End stage renal disease: Secondary | ICD-10-CM | POA: Diagnosis not present

## 2019-11-11 DIAGNOSIS — Z992 Dependence on renal dialysis: Secondary | ICD-10-CM | POA: Diagnosis not present

## 2019-11-11 DIAGNOSIS — D509 Iron deficiency anemia, unspecified: Secondary | ICD-10-CM | POA: Diagnosis not present

## 2019-11-11 DIAGNOSIS — N2581 Secondary hyperparathyroidism of renal origin: Secondary | ICD-10-CM | POA: Diagnosis not present

## 2019-11-11 DIAGNOSIS — E1129 Type 2 diabetes mellitus with other diabetic kidney complication: Secondary | ICD-10-CM | POA: Diagnosis not present

## 2019-11-12 ENCOUNTER — Ambulatory Visit: Payer: Medicare Other | Admitting: Vascular Surgery

## 2019-11-12 ENCOUNTER — Encounter (HOSPITAL_COMMUNITY): Payer: Medicare Other

## 2019-11-13 DIAGNOSIS — E1129 Type 2 diabetes mellitus with other diabetic kidney complication: Secondary | ICD-10-CM | POA: Diagnosis not present

## 2019-11-13 DIAGNOSIS — Z992 Dependence on renal dialysis: Secondary | ICD-10-CM | POA: Diagnosis not present

## 2019-11-13 DIAGNOSIS — N186 End stage renal disease: Secondary | ICD-10-CM | POA: Diagnosis not present

## 2019-11-13 DIAGNOSIS — N2581 Secondary hyperparathyroidism of renal origin: Secondary | ICD-10-CM | POA: Diagnosis not present

## 2019-11-13 DIAGNOSIS — D509 Iron deficiency anemia, unspecified: Secondary | ICD-10-CM | POA: Diagnosis not present

## 2019-11-16 DIAGNOSIS — Z992 Dependence on renal dialysis: Secondary | ICD-10-CM | POA: Diagnosis not present

## 2019-11-16 DIAGNOSIS — D509 Iron deficiency anemia, unspecified: Secondary | ICD-10-CM | POA: Diagnosis not present

## 2019-11-16 DIAGNOSIS — N186 End stage renal disease: Secondary | ICD-10-CM | POA: Diagnosis not present

## 2019-11-16 DIAGNOSIS — E1129 Type 2 diabetes mellitus with other diabetic kidney complication: Secondary | ICD-10-CM | POA: Diagnosis not present

## 2019-11-16 DIAGNOSIS — N2581 Secondary hyperparathyroidism of renal origin: Secondary | ICD-10-CM | POA: Diagnosis not present

## 2019-11-17 ENCOUNTER — Ambulatory Visit (INDEPENDENT_AMBULATORY_CARE_PROVIDER_SITE_OTHER): Payer: Medicare Other | Admitting: Nurse Practitioner

## 2019-11-17 ENCOUNTER — Encounter: Payer: Self-pay | Admitting: Nurse Practitioner

## 2019-11-17 ENCOUNTER — Other Ambulatory Visit: Payer: Self-pay

## 2019-11-17 ENCOUNTER — Other Ambulatory Visit (INDEPENDENT_AMBULATORY_CARE_PROVIDER_SITE_OTHER): Payer: Medicare Other

## 2019-11-17 VITALS — BP 116/48 | HR 89 | Temp 97.8°F | Ht 71.0 in | Wt 175.0 lb

## 2019-11-17 DIAGNOSIS — K921 Melena: Secondary | ICD-10-CM

## 2019-11-17 DIAGNOSIS — R1032 Left lower quadrant pain: Secondary | ICD-10-CM | POA: Diagnosis not present

## 2019-11-17 DIAGNOSIS — R197 Diarrhea, unspecified: Secondary | ICD-10-CM

## 2019-11-17 LAB — CBC WITH DIFFERENTIAL/PLATELET
Basophils Absolute: 0 10*3/uL (ref 0.0–0.1)
Basophils Relative: 0.3 % (ref 0.0–3.0)
Eosinophils Absolute: 0.3 10*3/uL (ref 0.0–0.7)
Eosinophils Relative: 3.6 % (ref 0.0–5.0)
HCT: 32.6 % — ABNORMAL LOW (ref 39.0–52.0)
Hemoglobin: 10.7 g/dL — ABNORMAL LOW (ref 13.0–17.0)
Lymphocytes Relative: 22.2 % (ref 12.0–46.0)
Lymphs Abs: 1.6 10*3/uL (ref 0.7–4.0)
MCHC: 32.7 g/dL (ref 30.0–36.0)
MCV: 105.3 fl — ABNORMAL HIGH (ref 78.0–100.0)
Monocytes Absolute: 0.6 10*3/uL (ref 0.1–1.0)
Monocytes Relative: 8 % (ref 3.0–12.0)
Neutro Abs: 4.7 10*3/uL (ref 1.4–7.7)
Neutrophils Relative %: 65.9 % (ref 43.0–77.0)
Platelets: 180 10*3/uL (ref 150.0–400.0)
RBC: 3.09 Mil/uL — ABNORMAL LOW (ref 4.22–5.81)
RDW: 18 % — ABNORMAL HIGH (ref 11.5–15.5)
WBC: 7.1 10*3/uL (ref 4.0–10.5)

## 2019-11-17 LAB — C-REACTIVE PROTEIN: CRP: 1.7 mg/dL (ref 0.5–20.0)

## 2019-11-17 NOTE — Patient Instructions (Signed)
If you are age 79 or older, your body mass index should be between 23-30. Your Body mass index is 24.41 kg/m. If this is out of the aforementioned range listed, please consider follow up with your Primary Care Provider.  If you are age 47 or younger, your body mass index should be between 19-25. Your Body mass index is 24.41 kg/m. If this is out of the aformentioned range listed, please consider follow up with your Primary Care Provider.   Your provider has requested that you have lab work today. We ask that you go to our [facility] office at [address] [place], [place] [address]. Please enter through the main entrance and go to the elevator.  Press "B" on the elevator. The lab is located at the first door on the left as you exit the elevator.  1. Call the office if black stool reoccur. 2. Call the office if diarrhea worsens. 3. Please start on the probiotic Hardin Negus once daily.  Due to recent changes in healthcare laws, you may see the results of your imaging and laboratory studies on MyChart before your provider has had a chance to review them.  We understand that in some cases there may be results that are confusing or concerning to you. Not all laboratory results come back in the same time frame and the provider may be waiting for multiple results in order to interpret others.  Please give [place] 48 hours in order for your provider to thoroughly review all the results before contacting the office for clarification of your results.   Thank you for choosing [facility] Noralyn Pick, CRNP

## 2019-11-17 NOTE — H&P (View-Only) (Signed)
11/17/2019 [name] [id number] Sep 21, 1941   Chief Complaint:  Black stools  History of Present Illness: [name] is a 79 year old male with a past medical history of dementia, hypertension, coronary artery disease, peripheral vascular disease s/p aortobifemoral bypass 1992, diabetes mellitus type 2, hypothyroidism, sleep apnea, end-stage renal disease on hemodialysis Mon. 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  He developed left lower quadrant abdominal pain with passed 1 black soft stool on 2/13 and 2 black soft stools on 11/08/2019.  He presented to this [facility] emergency room on 11/08/2019.  Hemoglobin 10.6.  Hematocrit 33.0.  An abdominal/pelvic CT scan did not show any evidence of diverticulitis. He was hemodynamically stable and he was discharged home.  He presents today accompanied by his wife for further GI follow up. Prior to 2/13, he reported passing 2 to 3 normal brown formed stools. For the past week, he is passing 1 to 3 brown watery diarrhea stools daily. No bloody diarrhea. No further black stools. He has very mild LLQ "discomfort". Today, he passed a watery stool with solid bits of stool. His LLQ discomfort was less after he passed the diarrhea stool. He does not feel constipated. No fever, sweats or chills. No recent antibiotics. No new medications. 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 See GI procedure history below. He was dialyzed yesterday, the dialysis machine clotted as heparin is not used during dialysis due to a past subdural hematoma/skull fracture which required surgical intervention [name] hole surgery) 06/2018. He receives IV iron during dialysis, ? once weekly per his report.    CBC Latest Ref Rng & Units 11/08/2019 09/01/2019 04/02/2019  WBC 4.0 - 10.5 K/uL 8.5 - 8.3  Hemoglobin 13.0 - 17.0 g/dL 10.6(L) 8.8(L) 11.0(L)  Hematocrit 39.0 - 52.0 %  33.0(L) 26.0(L) 34.7(L)  Platelets 150 - 400 K/uL 172 - 259   CMP Latest Ref Rng & Units 11/08/2019 09/15/2019 09/01/2019  Glucose 70 - 99 mg/dL 137(H) 108(H) 107(H)  BUN 8 - 23 mg/dL 49(H) - 81(H)  Creatinine 0.61 - 1.24 mg/dL 9.19(H) - 13.90(H)  Sodium 135 - 145 mmol/L 137 - 139  Potassium 3.5 - 5.1 mmol/L 3.8 - 4.3  Chloride 98 - 111 mmol/L 93(L) - 98  CO2 22 - 32 mmol/L 24 - -  Calcium 8.9 - 10.3 mg/dL 8.4(L) - -  Total Protein 6.5 - 8.1 g/dL 6.1(L) - -  Total Bilirubin 0.3 - 1.2 mg/dL 0.7 - -  Alkaline Phos 38 - 126 U/L 95 - -  AST 15 - 41 U/L 34 - -  ALT 0 - 44 U/L 14 - -    Abdominal/pelvic CT with contrast 11/08/2019:  1. Sequela of chronic renal insufficiency. 2. Postsurgical changes from aortobifemoral bypass. Bypass graft is patent. 3. Numerous chronic lumbar compression deformities, stable. 4. No acute intra-abdominal or intrapelvic process. No etiology to explain the patient's symptoms.  Small bowel capsule endoscopy 03/17/2019:   1.  Complete study, adequate prep. 2.  No active bleeding/oozing. 3.  Few tiny red spots/possible tiny AVMs in the mid small bowel around 2 hours 30 minutes. 4.  Distal small bowel AVMs at 7 hours 13 minutes range.  Small bowel enteroscopy 11/20/2018 by Dr. Rush Landmark: - No gross lesions in esophagus. - A single spot with bleeding in the stomach. Treated with argon plasma coagulation (APC). - A single non-bleeding angioectasia  in the stomach. Treated with argon plasma coagulation (APC). - Erythematous mucosa in the greater curvature. - No other gross lesions in the stomach. - Two spots with oozing in the duodenum. Treated with argon plasma coagulation (APC). - Normal mucosa was found in the entire examined duodenum otherwise. - Three non-bleeding angioectasias in the jejunum. Treated with argon plasma coagulation (APC). - Normal mucosa was found in the jejunum otherwise. Tattooed.  Small bowel enteroscopy 06/03/2018 done due to UGI  bleed/melena/anemia by Dr. Cirigliano: - The examined portion of the jejunum was normal. - Normal duodenal bulb, first portion of the duodenum, second portion of the duodenum, third portion of the duodenum and fourth portion of the duodenum. - Normal esophagus. - A single recently bleeding angioectasia in the stomach. Treated with argon plasma coagulation (APC). - Normal gastric body, antrum and pylorus. - No specimens collected.  EGD 11/12/2017 done due to IDA by Dr. Nandigam. - Benign-appearing esophageal stenosis. - Erythematous mucosa in the stomach. No H. Pylori.  - Normal examined duodenum.  Colonoscopy 11/12/2017 done due to IDA by Dr. Nandigam: - The perianal and digital rectal examinations were normal. - A 9 mm tubulovillous polyp was found in the transverse colon. The polyp was sessile. The polyp was removed with a cold snare. Resection and retrieval were complete. - A 1 mm hyperplastic polyp was found in the recto-sigmoid colon. The polyp was sessile. The polyp was removed with a cold biopsy forceps. Resection and retrieval were complete. - A single localized non-bleeding erosion was found in the transverse colon. No stigmata of recent bleeding were seen. - A few small and large-mouthed diverticula were found in the sigmoid colon, descending colon, transverse colon, hepatic flexure and ascending colon. - Non-bleeding internal hemorrhoids were found during retroflexion. The hemorrhoids were small.   Current Outpatient Medications on File Prior to Visit  Medication Sig Dispense Refill  . acetaminophen (TYLENOL) 325 MG tablet Take 1-2 tablets (325-650 mg total) by mouth every 4 (four) hours as needed for mild pain.    . allopurinol (ZYLOPRIM) 100 MG tablet Take 100 mg by mouth daily.     . Blood Glucose Monitoring Suppl (FREESTYLE FREEDOM LITE) w/Device KIT Use to check blood sugar 2 times per day dx code E11.65 1 each 0  . Calcium Carbonate Antacid (TUMS PO) Take 2 tablets by  mouth at bedtime.     . camphor-menthol (SARNA) lotion Apply 1 application topically as needed for itching.    . Carboxymethylcellulose Sod PF 0.25 % SOLN Place 1 drop into both eyes as needed (dry eyes).    . cetirizine (ZYRTEC) 10 MG chewable tablet Chew 10 mg by mouth daily.    . clobetasol (TEMOVATE) 0.05 % external solution Apply 1 application topically 2 (two) times daily as needed (irritation). Apply to scalp  0  . denosumab (PROLIA) 60 MG/ML SOSY injection Inject 60 mg into the skin every 6 (six) months.    . donepezil (ARICEPT) 10 MG tablet Take 10 mg by mouth at bedtime.     . FLUOCINOLONE ACETONIDE SCALP 0.01 % OIL Apply 1 application topically at bedtime. Apply to scalp  0  . fluticasone (CUTIVATE) 0.05 % cream Apply 1 application topically 2 (two) times daily as needed (irritation). Apply to face  0  . fluticasone (FLONASE) 50 MCG/ACT nasal spray Place 1 spray into both nostrils daily. (Patient taking differently: Place 1 spray into both nostrils daily as needed for allergies. ) 16 g 2  . glucose blood (  FREESTYLE LITE) test strip USE THREE TIMES DAILY 200 strip 2  . hydrocortisone 2.5 % cream Apply 1 application topically as needed for after feeds. Legs and arms    . insulin aspart (NOVOLOG FLEXPEN) 100 UNIT/ML FlexPen INJECT 6-8 UNITS UNDER THE SKIN THREE TIMES DAILY BEFORE MEALS. DX:E11.65 (Patient taking differently: Inject 4-6 Units into the skin 3 (three) times daily with meals. INJECT 4 Unit BEFORE BREAKFAST and lunch   Inject 6 UNITS BEFORE DINNER UNITS UNDER THE SKIN THREE TIMES DAILY BEFORE MEALS. DX:E11.65) 15 mL 12  . Insulin Pen Needle (PEN NEEDLES) 32G X 4 MM MISC 1 each by Does not apply route 3 (three) times daily. USE PEN NEEDLES TO INJECT INSULIN THREE TIMES DAILY. 100 each 12  . ketoconazole (NIZORAL) 2 % shampoo Apply 1 application topically 2 (two) times a week. (Patient taking differently: Apply 1 application topically daily as needed for irritation. ) 120 mL 0  .  Lancets (FREESTYLE) lancets Use as instructed to check blood sugar 2 times per day dx code E11.65 100 each 3  . levothyroxine (SYNTHROID, LEVOTHROID) 50 MCG tablet Take 1 tablet (50 mcg total) by mouth daily. 90 tablet 1  . memantine (NAMENDA) 5 MG tablet Take 1 tablet (5 mg total) by mouth 2 (two) times daily. 60 tablet 6  . midodrine (PROAMATINE) 10 MG tablet Take one pill prior to hemodialysis on MWF (Patient taking differently: Take 10 mg by mouth every Monday, Wednesday, and Friday. Take one pill prior to hemodialysis)    . Naftifine HCl (NAFTIN) 2 % CREA Apply to feet daily for fungal infection (Patient taking differently: Apply 1 application topically daily as needed (for fungal infection). Apply to feet) 60 g prn  . Nutritional Supplements (FEEDING SUPPLEMENT, NEPRO CARB STEADY,) LIQD Take 237 mLs by mouth daily. Take 237 mg - 474 ml per day    . omeprazole (PRILOSEC) 40 MG capsule Take 1 capsule (40 mg total) by mouth daily. 30 capsule 0  . polyethylene glycol (MIRALAX / GLYCOLAX) packet Take 17 g by mouth daily. (Patient taking differently: Take 17 g by mouth as needed for mild constipation. ) 14 each 0  . sevelamer carbonate (RENVELA) 800 MG tablet Take 1,600 mg by mouth 3 (three) times daily before meals.     . simvastatin (ZOCOR) 20 MG tablet Take 20 mg by mouth at bedtime.     . TRADJENTA 5 MG TABS tablet Take 1 tablet (5 mg total) by mouth daily. 90 tablet 2   No current facility-administered medications on file prior to visit.   Allergies  Allergen Reactions  . Ambien [Zolpidem Tartrate] Other (See Comments)    hallucinations and "felt crazy"   . Penicillins Rash    Has patient had a PCN reaction causing immediate rash, facial/tongue/throat swelling, SOB or lightheadedness with hypotension: Yes Has patient had a PCN reaction causing severe rash involving mucus membranes or skin necrosis: Yes Has patient had a PCN reaction that required hospitalization: No Has patient had a PCN  reaction occurring within the last 10 years: No If all of the above answers are "NO", then may proceed with Cephalosporin use.     Current Medications, Allergies, Past Medical History, Past Surgical History, Family History and Social History were reviewed in [facility] electronic medical record.   Physical Exam:BP (!) 116/48   Pulse 89   Temp 97.8 F (36.6 C)   Ht 5' 11" (1.803 m)   Wt 175 lb (79.4 kg)     BMI 24.41 kg/m  General: Well developed  78 year old male in no acute distress. Head: Normocephalic and atraumatic. Eyes:  No scleral icterus. Conjunctiva pink . Ears: Normal auditory acuity. Lungs: Clear throughout to auscultation. Heart: Regular rate and rhythm, no murmur. Abdomen: Soft, nontender and nondistended. No masses or hepatomegaly. Normal bowel sounds x 4 quadrants. Faint bruit LUQ. Extensive midline abdominal scar.  Rectal: Light brown stool guaiac negative. No obvious blood or melena. No mass.  Musculoskeletal: Symmetrical with no gross deformities. Extremities: RUE fistula  with + bruit and thrill. No LE edema.  Neurological: Alert oriented x 4. No focal deficits.  Psychological:  Alert and cooperative. Normal mood and affect  Assessment and Recommendations:  1. 78 year old male with a history of UGI bleed secondary to gastric and small bowel AVMs had recurrence of melenic stool and LLQ pain on 2/13 and 11/07/2018. Labs in the ED 2/14 showed a stable Hg of 10.6. CTAP without evidence of diverticulitis. No further melenic stool since. He developed watery non-bloody diarrhea 1 week ago with less LLQ discomfort.  -CBC, BMP, Iron panel and GI pathogen -Phillip's bacteria probiotic once daily -Bland diet -Patient to call office if his diarrhea or LLQ discomfort worsens.  -I will consult with Dr. Ciriglilano before scheduling the patient for a repeat EGD with enteroscopy (due to recurrence of melenic stool on 2/13-2/14).  2. IDA secondary to # 1 and ESRD on  hemodialysis Q Mon-Wed-Fri  3. Diverticulosis. No evidence of diverticular bleed at this time.   4. Colon polyps -Next colonoscopy due 10/2022 if appropriate at that time as the patient will be 79 years old  5. DM II   

## 2019-11-17 NOTE — Progress Notes (Signed)
11/17/2019 THEOPLIS [name] [id number] Sep 21, 1941   Chief Complaint:  Black stools  History of Present Illness: [name]. [name] is a 79 year old male with a past medical history of dementia, hypertension, coronary artery disease, peripheral vascular disease s/p aortobifemoral bypass 1992, diabetes mellitus type 2, hypothyroidism, sleep apnea, end-stage renal disease on hemodialysis Mon. 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  He developed left lower quadrant abdominal pain with passed 1 black soft stool on 2/13 and 2 black soft stools on 11/08/2019.  He presented to [facility] emergency room on 11/08/2019.  Hemoglobin 10.6.  Hematocrit 33.0.  An abdominal/pelvic CT scan did not show any evidence of diverticulitis. He was hemodynamically stable and he was discharged home.  He presents today accompanied by his wife for further GI follow up. Prior to 2/13, he reported passing 2 to 3 normal brown formed stools. For the past week, he is passing 1 to 3 brown watery diarrhea stools daily. No bloody diarrhea. No further black stools. He has very mild LLQ "discomfort". Today, he passed a watery stool with solid bits of stool. His LLQ discomfort was less after he passed the diarrhea stool. He does not feel constipated. No fever, sweats or chills. No recent antibiotics. No new medications. 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 See GI procedure history below. He was dialyzed yesterday, the dialysis machine clotted as heparin is not used during dialysis due to a past subdural hematoma/skull fracture which required surgical intervention [name] hole surgery) 06/2018. He receives IV iron during dialysis, ? once weekly per his report.    CBC Latest Ref Rng & Units 11/08/2019 09/01/2019 04/02/2019  WBC 4.0 - 10.5 K/uL 8.5 - 8.3  Hemoglobin 13.0 - 17.0 g/dL 10.6(L) 8.8(L) 11.0(L)  Hematocrit 39.0 - 52.0 %  33.0(L) 26.0(L) 34.7(L)  Platelets 150 - 400 K/uL 172 - 259   CMP Latest Ref Rng & Units 11/08/2019 09/15/2019 09/01/2019  Glucose 70 - 99 mg/dL 137(H) 108(H) 107(H)  BUN 8 - 23 mg/dL 49(H) - 81(H)  Creatinine 0.61 - 1.24 mg/dL 9.19(H) - 13.90(H)  Sodium 135 - 145 mmol/L 137 - 139  Potassium 3.5 - 5.1 mmol/L 3.8 - 4.3  Chloride 98 - 111 mmol/L 93(L) - 98  CO2 22 - 32 mmol/L 24 - -  Calcium 8.9 - 10.3 mg/dL 8.4(L) - -  Total Protein 6.5 - 8.1 g/dL 6.1(L) - -  Total Bilirubin 0.3 - 1.2 mg/dL 0.7 - -  Alkaline Phos 38 - 126 U/L 95 - -  AST 15 - 41 U/L 34 - -  ALT 0 - 44 U/L 14 - -    Abdominal/pelvic CT with contrast 11/08/2019:  1. Sequela of chronic renal insufficiency. 2. Postsurgical changes from aortobifemoral bypass. Bypass graft is patent. 3. Numerous chronic lumbar compression deformities, stable. 4. No acute intra-abdominal or intrapelvic process. No etiology to explain the patient's symptoms.  Small bowel capsule endoscopy 03/17/2019:   1.  Complete study, adequate prep. 2.  No active bleeding/oozing. 3.  Few tiny red spots/possible tiny AVMs in the mid small bowel around 2 hours 30 minutes. 4.  Distal small bowel AVMs at 7 hours 13 minutes range.  Small bowel enteroscopy 11/20/2018 by Dr. Rush Landmark: - No gross lesions in esophagus. - A single spot with bleeding in the stomach. Treated with argon plasma coagulation (APC). - A single non-bleeding angioectasia  in the stomach. Treated with argon plasma coagulation (APC). - Erythematous mucosa in the greater curvature. - No other gross lesions in the stomach. - Two spots with oozing in the duodenum. Treated with argon plasma coagulation (APC). - Normal mucosa was found in the entire examined duodenum otherwise. - Three non-bleeding angioectasias in the jejunum. Treated with argon plasma coagulation (APC). - Normal mucosa was found in the jejunum otherwise. Tattooed.  Small bowel enteroscopy 06/03/2018 done due to UGI  bleed/melena/anemia by Dr. Cirigliano: - The examined portion of the jejunum was normal. - Normal duodenal bulb, first portion of the duodenum, second portion of the duodenum, third portion of the duodenum and fourth portion of the duodenum. - Normal esophagus. - A single recently bleeding angioectasia in the stomach. Treated with argon plasma coagulation (APC). - Normal gastric body, antrum and pylorus. - No specimens collected.  EGD 11/12/2017 done due to IDA by Dr. Nandigam. - Benign-appearing esophageal stenosis. - Erythematous mucosa in the stomach. No H. Pylori.  - Normal examined duodenum.  Colonoscopy 11/12/2017 done due to IDA by Dr. Nandigam: - The perianal and digital rectal examinations were normal. - A 9 mm tubulovillous polyp was found in the transverse colon. The polyp was sessile. The polyp was removed with a cold snare. Resection and retrieval were complete. - A 1 mm hyperplastic polyp was found in the recto-sigmoid colon. The polyp was sessile. The polyp was removed with a cold biopsy forceps. Resection and retrieval were complete. - A single localized non-bleeding erosion was found in the transverse colon. No stigmata of recent bleeding were seen. - A few small and large-mouthed diverticula were found in the sigmoid colon, descending colon, transverse colon, hepatic flexure and ascending colon. - Non-bleeding internal hemorrhoids were found during retroflexion. The hemorrhoids were small.   Current Outpatient Medications on File Prior to Visit  Medication Sig Dispense Refill  . acetaminophen (TYLENOL) 325 MG tablet Take 1-2 tablets (325-650 mg total) by mouth every 4 (four) hours as needed for mild pain.    . allopurinol (ZYLOPRIM) 100 MG tablet Take 100 mg by mouth daily.     . Blood Glucose Monitoring Suppl (FREESTYLE FREEDOM LITE) w/Device KIT Use to check blood sugar 2 times per day dx code E11.65 1 each 0  . Calcium Carbonate Antacid (TUMS PO) Take 2 tablets by  mouth at bedtime.     . camphor-menthol (SARNA) lotion Apply 1 application topically as needed for itching.    . Carboxymethylcellulose Sod PF 0.25 % SOLN Place 1 drop into both eyes as needed (dry eyes).    . cetirizine (ZYRTEC) 10 MG chewable tablet Chew 10 mg by mouth daily.    . clobetasol (TEMOVATE) 0.05 % external solution Apply 1 application topically 2 (two) times daily as needed (irritation). Apply to scalp  0  . denosumab (PROLIA) 60 MG/ML SOSY injection Inject 60 mg into the skin every 6 (six) months.    . donepezil (ARICEPT) 10 MG tablet Take 10 mg by mouth at bedtime.     . FLUOCINOLONE ACETONIDE SCALP 0.01 % OIL Apply 1 application topically at bedtime. Apply to scalp  0  . fluticasone (CUTIVATE) 0.05 % cream Apply 1 application topically 2 (two) times daily as needed (irritation). Apply to face  0  . fluticasone (FLONASE) 50 MCG/ACT nasal spray Place 1 spray into both nostrils daily. (Patient taking differently: Place 1 spray into both nostrils daily as needed for allergies. ) 16 g 2  . glucose blood (  FREESTYLE LITE) test strip USE THREE TIMES DAILY 200 strip 2  . hydrocortisone 2.5 % cream Apply 1 application topically as needed for after feeds. Legs and arms    . insulin aspart (NOVOLOG FLEXPEN) 100 UNIT/ML FlexPen INJECT 6-8 UNITS UNDER THE SKIN THREE TIMES DAILY BEFORE MEALS. DX:E11.65 (Patient taking differently: Inject 4-6 Units into the skin 3 (three) times daily with meals. INJECT 4 Unit BEFORE BREAKFAST and lunch   Inject 6 UNITS BEFORE DINNER UNITS UNDER THE SKIN THREE TIMES DAILY BEFORE MEALS. DX:E11.65) 15 mL 12  . Insulin Pen Needle (PEN NEEDLES) 32G X 4 MM MISC 1 each by Does not apply route 3 (three) times daily. USE PEN NEEDLES TO INJECT INSULIN THREE TIMES DAILY. 100 each 12  . ketoconazole (NIZORAL) 2 % shampoo Apply 1 application topically 2 (two) times a week. (Patient taking differently: Apply 1 application topically daily as needed for irritation. ) 120 mL 0  .  Lancets (FREESTYLE) lancets Use as instructed to check blood sugar 2 times per day dx code E11.65 100 each 3  . levothyroxine (SYNTHROID, LEVOTHROID) 50 MCG tablet Take 1 tablet (50 mcg total) by mouth daily. 90 tablet 1  . memantine (NAMENDA) 5 MG tablet Take 1 tablet (5 mg total) by mouth 2 (two) times daily. 60 tablet 6  . midodrine (PROAMATINE) 10 MG tablet Take one pill prior to hemodialysis on MWF (Patient taking differently: Take 10 mg by mouth every Monday, Wednesday, and Friday. Take one pill prior to hemodialysis)    . Naftifine HCl (NAFTIN) 2 % CREA Apply to feet daily for fungal infection (Patient taking differently: Apply 1 application topically daily as needed (for fungal infection). Apply to feet) 60 g prn  . Nutritional Supplements (FEEDING SUPPLEMENT, NEPRO CARB STEADY,) LIQD Take 237 mLs by mouth daily. Take 237 mg - 474 ml per day    . omeprazole (PRILOSEC) 40 MG capsule Take 1 capsule (40 mg total) by mouth daily. 30 capsule 0  . polyethylene glycol (MIRALAX / GLYCOLAX) packet Take 17 g by mouth daily. (Patient taking differently: Take 17 g by mouth as needed for mild constipation. ) 14 each 0  . sevelamer carbonate (RENVELA) 800 MG tablet Take 1,600 mg by mouth 3 (three) times daily before meals.     . simvastatin (ZOCOR) 20 MG tablet Take 20 mg by mouth at bedtime.     . TRADJENTA 5 MG TABS tablet Take 1 tablet (5 mg total) by mouth daily. 90 tablet 2   No current facility-administered medications on file prior to visit.   Allergies  Allergen Reactions  . Ambien [Zolpidem Tartrate] Other (See Comments)    hallucinations and "felt crazy"   . Penicillins Rash    Has patient had a PCN reaction causing immediate rash, facial/tongue/throat swelling, SOB or lightheadedness with hypotension: Yes Has patient had a PCN reaction causing severe rash involving mucus membranes or skin necrosis: Yes Has patient had a PCN reaction that required hospitalization: No Has patient had a PCN  reaction occurring within the last 10 years: No If all of the above answers are "NO", then may proceed with Cephalosporin use.     Current Medications, Allergies, Past Medical History, Past Surgical History, Family History and Social History were reviewed in [facility] electronic medical record.   Physical Exam:BP (!) 116/48   Pulse 89   Temp 97.8 F (36.6 C)   Ht 5' 11" (1.803 m)   Wt 175 lb (79.4 kg)     BMI 24.41 kg/m  General: Well developed  78 year old male in no acute distress. Head: Normocephalic and atraumatic. Eyes:  No scleral icterus. Conjunctiva pink . Ears: Normal auditory acuity. Lungs: Clear throughout to auscultation. Heart: Regular rate and rhythm, no murmur. Abdomen: Soft, nontender and nondistended. No masses or hepatomegaly. Normal bowel sounds x 4 quadrants. Faint bruit LUQ. Extensive midline abdominal scar.  Rectal: Light brown stool guaiac negative. No obvious blood or melena. No mass.  Musculoskeletal: Symmetrical with no gross deformities. Extremities: RUE fistula  with + bruit and thrill. No LE edema.  Neurological: Alert oriented x 4. No focal deficits.  Psychological:  Alert and cooperative. Normal mood and affect  Assessment and Recommendations:  1. 78 year old male with a history of UGI bleed secondary to gastric and small bowel AVMs had recurrence of melenic stool and LLQ pain on 2/13 and 11/07/2018. Labs in the ED 2/14 showed a stable Hg of 10.6. CTAP without evidence of diverticulitis. No further melenic stool since. He developed watery non-bloody diarrhea 1 week ago with less LLQ discomfort.  -CBC, BMP, Iron panel and GI pathogen -Phillip's bacteria probiotic once daily -Bland diet -Patient to call office if his diarrhea or LLQ discomfort worsens.  -I will consult with Dr. Ciriglilano before scheduling the patient for a repeat EGD with enteroscopy (due to recurrence of melenic stool on 2/13-2/14).  2. IDA secondary to # 1 and ESRD on  hemodialysis Q Mon-Wed-Fri  3. Diverticulosis. No evidence of diverticular bleed at this time.   4. Colon polyps -Next colonoscopy due 10/2022 if appropriate at that time as the patient will be 79 years old  5. DM II   

## 2019-11-18 ENCOUNTER — Telehealth: Payer: Self-pay | Admitting: Nurse Practitioner

## 2019-11-18 DIAGNOSIS — N186 End stage renal disease: Secondary | ICD-10-CM | POA: Diagnosis not present

## 2019-11-18 DIAGNOSIS — E1129 Type 2 diabetes mellitus with other diabetic kidney complication: Secondary | ICD-10-CM | POA: Diagnosis not present

## 2019-11-18 DIAGNOSIS — D631 Anemia in chronic kidney disease: Secondary | ICD-10-CM

## 2019-11-18 DIAGNOSIS — D509 Iron deficiency anemia, unspecified: Secondary | ICD-10-CM | POA: Diagnosis not present

## 2019-11-18 DIAGNOSIS — N2581 Secondary hyperparathyroidism of renal origin: Secondary | ICD-10-CM | POA: Diagnosis not present

## 2019-11-18 DIAGNOSIS — K219 Gastro-esophageal reflux disease without esophagitis: Secondary | ICD-10-CM

## 2019-11-18 DIAGNOSIS — Z992 Dependence on renal dialysis: Secondary | ICD-10-CM | POA: Diagnosis not present

## 2019-11-18 LAB — BASIC METABOLIC PANEL
BUN: 27 mg/dL — ABNORMAL HIGH (ref 6–23)
CO2: 31 mEq/L (ref 19–32)
Calcium: 8.6 mg/dL (ref 8.4–10.5)
Chloride: 97 mEq/L (ref 96–112)
Creatinine, Ser: 6.52 mg/dL (ref 0.40–1.50)
GFR: 10.04 mL/min — CL (ref >60.00)
Glucose, Bld: 163 mg/dL — ABNORMAL HIGH (ref 70–99)
Potassium: 3.6 mEq/L (ref 3.5–5.1)
Sodium: 137 mEq/L (ref 135–145)

## 2019-11-18 LAB — IBC + FERRITIN
Ferritin: 328.3 ng/mL — ABNORMAL HIGH (ref 22.0–322.0)
Iron: 47 ug/dL (ref 42–165)
Saturation Ratios: 20 % (ref 20.0–50.0)
Transferrin: 168 mg/dL — ABNORMAL LOW (ref 212.0–360.0)

## 2019-11-18 NOTE — Progress Notes (Signed)
Agree with the assessment and plan as outlined by Carl Best, NP.  Reasonable to proceed with small bowel enteroscopy (push enteroscopy) at [facility] given recent recurrence of melenic stool and prior history of gastric and small bowel AVMs requiring APC.  While his hemoglobin is stable from previous and otherwise hemodynamically stable, repeating a study now for treatment may stave off need for admission and more urgent/emergent endoscopy, blood products, etc.  However, if he is hesitant about repeat endoscopy given resolution of melena, I understand, and in that case plan for observation with repeat labs in another 4-6 weeks or so (essentially evaluating for occult GI blood loss).  Gerrit Heck, DO, [facility]

## 2019-11-18 NOTE — Telephone Encounter (Signed)
Bre, pls call the patient, most likely speak to his wife as she facilitates his appointments and he some short term memory issues. Please schedule the patient for an EGD with push enteroscopy with Dr Bryan Lemma at [facility]. Note, patient has dialysis M-W_F so procedure should be a nondialysis day. I called and left a msg on their home voice mail letting them know you would be calling them. We discussed this procedure at the time of his office visit yesterday and they knew I was going to confirm the plan with Dr. Bryan Lemma.

## 2019-11-18 NOTE — Telephone Encounter (Signed)
I thought I had the room at [facility] all day on 3/10?  If that has changed, then okay to move to 3/29

## 2019-11-18 NOTE — Telephone Encounter (Signed)
The 12/02/2019 [facility] date is unavailable -is 12/21/2019 [facility] date appropriate for this patient-please advise

## 2019-11-19 DIAGNOSIS — D631 Anemia in chronic kidney disease: Secondary | ICD-10-CM

## 2019-11-19 DIAGNOSIS — Z992 Dependence on renal dialysis: Secondary | ICD-10-CM

## 2019-11-19 NOTE — Addendum Note (Signed)
Addended by: Mohammed Kindle on: 11/19/2019 02:15 PM   Modules accepted: Orders

## 2019-11-19 NOTE — Telephone Encounter (Signed)
fyi

## 2019-11-19 NOTE — Telephone Encounter (Addendum)
Called and spoke with patient's wife Shirley-verified DPR-Shirley informed of MD recommendations and is agreeable with plan of care; patient has been scheduled for COVID screening on 11/30/2019 at 9:30 am; patient has been scheduled for EGD with push enteroscopy at [facility] on 12/03/2019 at 8:30 am; instructions have been sent to patient via MyChart and also mailed to patient per Shirley's request; [name] verbalized understanding of information/instructions;  [name] was advised to call the office at [phone] if questions/concerns arise; amb ref order has been placed in Epic; hospital orders have also been placed in Epic;

## 2019-11-20 ENCOUNTER — Other Ambulatory Visit: Payer: Medicare Other

## 2019-11-20 DIAGNOSIS — K921 Melena: Secondary | ICD-10-CM | POA: Diagnosis not present

## 2019-11-20 DIAGNOSIS — R1032 Left lower quadrant pain: Secondary | ICD-10-CM | POA: Diagnosis not present

## 2019-11-20 DIAGNOSIS — D509 Iron deficiency anemia, unspecified: Secondary | ICD-10-CM | POA: Diagnosis not present

## 2019-11-20 DIAGNOSIS — Z992 Dependence on renal dialysis: Secondary | ICD-10-CM | POA: Diagnosis not present

## 2019-11-20 DIAGNOSIS — E1129 Type 2 diabetes mellitus with other diabetic kidney complication: Secondary | ICD-10-CM | POA: Diagnosis not present

## 2019-11-20 DIAGNOSIS — R197 Diarrhea, unspecified: Secondary | ICD-10-CM

## 2019-11-20 DIAGNOSIS — N186 End stage renal disease: Secondary | ICD-10-CM | POA: Diagnosis not present

## 2019-11-20 DIAGNOSIS — N2581 Secondary hyperparathyroidism of renal origin: Secondary | ICD-10-CM | POA: Diagnosis not present

## 2019-11-23 DIAGNOSIS — E1122 Type 2 diabetes mellitus with diabetic chronic kidney disease: Secondary | ICD-10-CM | POA: Diagnosis not present

## 2019-11-23 DIAGNOSIS — Z992 Dependence on renal dialysis: Secondary | ICD-10-CM | POA: Diagnosis not present

## 2019-11-23 DIAGNOSIS — D631 Anemia in chronic kidney disease: Secondary | ICD-10-CM | POA: Diagnosis not present

## 2019-11-23 DIAGNOSIS — N2581 Secondary hyperparathyroidism of renal origin: Secondary | ICD-10-CM | POA: Diagnosis not present

## 2019-11-23 DIAGNOSIS — D509 Iron deficiency anemia, unspecified: Secondary | ICD-10-CM | POA: Diagnosis not present

## 2019-11-23 DIAGNOSIS — E1129 Type 2 diabetes mellitus with other diabetic kidney complication: Secondary | ICD-10-CM | POA: Diagnosis not present

## 2019-11-23 DIAGNOSIS — N186 End stage renal disease: Secondary | ICD-10-CM | POA: Diagnosis not present

## 2019-11-24 LAB — GASTROINTESTINAL PATHOGEN PANEL PCR
C. difficile Tox A/B, PCR: DETECTED — AB
Campylobacter, PCR: NOT DETECTED
Cryptosporidium, PCR: NOT DETECTED
E coli (ETEC) LT/ST PCR: NOT DETECTED
E coli (STEC) stx1/stx2, PCR: NOT DETECTED
E coli 0157, PCR: NOT DETECTED
Giardia lamblia, PCR: NOT DETECTED
Norovirus, PCR: NOT DETECTED
Rotavirus A, PCR: NOT DETECTED
Salmonella, PCR: NOT DETECTED
Shigella, PCR: NOT DETECTED

## 2019-11-25 ENCOUNTER — Telehealth: Payer: Self-pay | Admitting: Nurse Practitioner

## 2019-11-25 DIAGNOSIS — Z992 Dependence on renal dialysis: Secondary | ICD-10-CM | POA: Diagnosis not present

## 2019-11-25 DIAGNOSIS — D509 Iron deficiency anemia, unspecified: Secondary | ICD-10-CM | POA: Diagnosis not present

## 2019-11-25 DIAGNOSIS — R197 Diarrhea, unspecified: Secondary | ICD-10-CM

## 2019-11-25 DIAGNOSIS — N186 End stage renal disease: Secondary | ICD-10-CM | POA: Diagnosis not present

## 2019-11-25 DIAGNOSIS — N2581 Secondary hyperparathyroidism of renal origin: Secondary | ICD-10-CM | POA: Diagnosis not present

## 2019-11-25 DIAGNOSIS — E1129 Type 2 diabetes mellitus with other diabetic kidney complication: Secondary | ICD-10-CM | POA: Diagnosis not present

## 2019-11-26 NOTE — Telephone Encounter (Signed)
Called and spoke with patient's wife-[name]-verified DPR- [name] reports the patient jsut complete lab work, including stool samples, and was told to call back to the office if the stool continued to be "watery" -please see result note from [place]  -please advise

## 2019-11-26 NOTE — Telephone Encounter (Signed)
Left message for patient to call back to the office;  

## 2019-11-26 NOTE — Telephone Encounter (Signed)
Supportive care with p.o. as tolerated, BRAT diet.  Send to lab for CBC, GI PCR panel, C. difficile.

## 2019-11-26 NOTE — Telephone Encounter (Signed)
Patient's wife-Shirley called into the office-verified DPR-Shirley reports the patient is complaining of pain on LUQ pain (x 3 weeks-progressively worsening) -having constant, loose bowel movements (at least 3 per day)=denies any blood in stool/rectal bleeding/rectal pain/ fevers/nausea/vomiting/diet changes or appetite changes/no medications changes  Questionable flare? Please advise of therapy for diarrhea (as this is the patient's main concern at this time)-

## 2019-11-27 DIAGNOSIS — N2581 Secondary hyperparathyroidism of renal origin: Secondary | ICD-10-CM | POA: Diagnosis not present

## 2019-11-27 DIAGNOSIS — E1129 Type 2 diabetes mellitus with other diabetic kidney complication: Secondary | ICD-10-CM | POA: Diagnosis not present

## 2019-11-27 DIAGNOSIS — Z992 Dependence on renal dialysis: Secondary | ICD-10-CM | POA: Diagnosis not present

## 2019-11-27 DIAGNOSIS — D509 Iron deficiency anemia, unspecified: Secondary | ICD-10-CM | POA: Diagnosis not present

## 2019-11-27 DIAGNOSIS — N186 End stage renal disease: Secondary | ICD-10-CM | POA: Diagnosis not present

## 2019-11-28 ENCOUNTER — Other Ambulatory Visit: Payer: Self-pay | Admitting: Nurse Practitioner

## 2019-11-28 DIAGNOSIS — A0472 Enterocolitis due to Clostridium difficile, not specified as recurrent: Secondary | ICD-10-CM

## 2019-11-28 MED ORDER — SACCHAROMYCES BOULARDII 250 MG PO CAPS: 250.0000 mg | ORAL_CAPSULE | Freq: Two times a day (BID) | ORAL | 0 refills | Status: AC

## 2019-11-28 MED ORDER — VANCOMYCIN HCL 125 MG PO CAPS: 125.0000 mg | ORAL_CAPSULE | Freq: Four times a day (QID) | ORAL | 0 refills | Status: AC

## 2019-11-30 ENCOUNTER — Other Ambulatory Visit (HOSPITAL_COMMUNITY)
Admission: RE | Admit: 2019-11-30 | Discharge: 2019-11-30 | Disposition: A | Discharge status: Home / Self Care | Payer: Medicare Other | Source: Ambulatory Visit | Attending: Gastroenterology | Admitting: Gastroenterology

## 2019-11-30 DIAGNOSIS — Z01812 Encounter for preprocedural laboratory examination: Secondary | ICD-10-CM | POA: Insufficient documentation

## 2019-11-30 DIAGNOSIS — N186 End stage renal disease: Secondary | ICD-10-CM | POA: Diagnosis not present

## 2019-11-30 DIAGNOSIS — N2581 Secondary hyperparathyroidism of renal origin: Secondary | ICD-10-CM | POA: Diagnosis not present

## 2019-11-30 DIAGNOSIS — D509 Iron deficiency anemia, unspecified: Secondary | ICD-10-CM | POA: Diagnosis not present

## 2019-11-30 DIAGNOSIS — E1129 Type 2 diabetes mellitus with other diabetic kidney complication: Secondary | ICD-10-CM | POA: Diagnosis not present

## 2019-11-30 DIAGNOSIS — Z992 Dependence on renal dialysis: Secondary | ICD-10-CM | POA: Diagnosis not present

## 2019-11-30 DIAGNOSIS — Z20822 Contact with and (suspected) exposure to covid-19: Secondary | ICD-10-CM | POA: Diagnosis not present

## 2019-11-30 LAB — SARS CORONAVIRUS 2 (TAT 6-24 HRS): SARS Coronavirus 2: NEGATIVE

## 2019-12-01 ENCOUNTER — Telehealth: Payer: Self-pay | Admitting: Nurse Practitioner

## 2019-12-01 NOTE — Telephone Encounter (Signed)
Called and spoke with patient's wife-Shirley-verified DPR-questions concerning prep and medication times answered at this time; all questions answered; [name]  advised to call back to the office at [phone] should questions/concerns arise;  [name] verbalized understanding of information/instructions;

## 2019-12-02 DIAGNOSIS — E1129 Type 2 diabetes mellitus with other diabetic kidney complication: Secondary | ICD-10-CM | POA: Diagnosis not present

## 2019-12-02 DIAGNOSIS — N2581 Secondary hyperparathyroidism of renal origin: Secondary | ICD-10-CM | POA: Diagnosis not present

## 2019-12-02 DIAGNOSIS — Z992 Dependence on renal dialysis: Secondary | ICD-10-CM | POA: Diagnosis not present

## 2019-12-02 DIAGNOSIS — N186 End stage renal disease: Secondary | ICD-10-CM | POA: Diagnosis not present

## 2019-12-02 DIAGNOSIS — D509 Iron deficiency anemia, unspecified: Secondary | ICD-10-CM | POA: Diagnosis not present

## 2019-12-03 ENCOUNTER — Ambulatory Visit (HOSPITAL_COMMUNITY): Payer: Medicare Other | Admitting: Certified Registered"

## 2019-12-03 ENCOUNTER — Other Ambulatory Visit: Payer: Self-pay

## 2019-12-03 ENCOUNTER — Encounter (HOSPITAL_COMMUNITY): Payer: Self-pay | Admitting: Gastroenterology

## 2019-12-03 ENCOUNTER — Ambulatory Visit (HOSPITAL_COMMUNITY)
Admission: RE | Admit: 2019-12-03 | Discharge: 2019-12-03 | Disposition: A | Discharge status: Home / Self Care | Payer: Medicare Other | Attending: Gastroenterology | Admitting: Gastroenterology

## 2019-12-03 ENCOUNTER — Encounter (HOSPITAL_COMMUNITY)
Admission: RE | Disposition: A | Discharge status: Home / Self Care | Payer: Self-pay | Source: Home / Self Care | Attending: Gastroenterology

## 2019-12-03 DIAGNOSIS — K921 Melena: Secondary | ICD-10-CM

## 2019-12-03 DIAGNOSIS — Z79899 Other long term (current) drug therapy: Secondary | ICD-10-CM | POA: Diagnosis not present

## 2019-12-03 DIAGNOSIS — K5520 Angiodysplasia of colon without hemorrhage: Secondary | ICD-10-CM | POA: Diagnosis not present

## 2019-12-03 DIAGNOSIS — D631 Anemia in chronic kidney disease: Secondary | ICD-10-CM

## 2019-12-03 DIAGNOSIS — K219 Gastro-esophageal reflux disease without esophagitis: Secondary | ICD-10-CM | POA: Insufficient documentation

## 2019-12-03 DIAGNOSIS — E039 Hypothyroidism, unspecified: Secondary | ICD-10-CM | POA: Diagnosis not present

## 2019-12-03 DIAGNOSIS — Z888 Allergy status to other drugs, medicaments and biological substances status: Secondary | ICD-10-CM | POA: Diagnosis not present

## 2019-12-03 DIAGNOSIS — K31819 Angiodysplasia of stomach and duodenum without bleeding: Secondary | ICD-10-CM | POA: Insufficient documentation

## 2019-12-03 DIAGNOSIS — K31811 Angiodysplasia of stomach and duodenum with bleeding: Secondary | ICD-10-CM | POA: Diagnosis present

## 2019-12-03 DIAGNOSIS — E1151 Type 2 diabetes mellitus with diabetic peripheral angiopathy without gangrene: Secondary | ICD-10-CM | POA: Insufficient documentation

## 2019-12-03 DIAGNOSIS — K222 Esophageal obstruction: Secondary | ICD-10-CM | POA: Diagnosis not present

## 2019-12-03 DIAGNOSIS — Z794 Long term (current) use of insulin: Secondary | ICD-10-CM | POA: Insufficient documentation

## 2019-12-03 DIAGNOSIS — D123 Benign neoplasm of transverse colon: Secondary | ICD-10-CM | POA: Insufficient documentation

## 2019-12-03 DIAGNOSIS — K449 Diaphragmatic hernia without obstruction or gangrene: Secondary | ICD-10-CM | POA: Diagnosis not present

## 2019-12-03 DIAGNOSIS — F0390 Unspecified dementia without behavioral disturbance: Secondary | ICD-10-CM | POA: Insufficient documentation

## 2019-12-03 DIAGNOSIS — Z880 Allergy status to penicillin: Secondary | ICD-10-CM | POA: Diagnosis not present

## 2019-12-03 DIAGNOSIS — N186 End stage renal disease: Secondary | ICD-10-CM | POA: Insufficient documentation

## 2019-12-03 DIAGNOSIS — K648 Other hemorrhoids: Secondary | ICD-10-CM | POA: Insufficient documentation

## 2019-12-03 DIAGNOSIS — Z992 Dependence on renal dialysis: Secondary | ICD-10-CM | POA: Diagnosis not present

## 2019-12-03 DIAGNOSIS — I2510 Atherosclerotic heart disease of native coronary artery without angina pectoris: Secondary | ICD-10-CM | POA: Diagnosis not present

## 2019-12-03 DIAGNOSIS — Z79890 Hormone replacement therapy (postmenopausal): Secondary | ICD-10-CM | POA: Insufficient documentation

## 2019-12-03 DIAGNOSIS — E1122 Type 2 diabetes mellitus with diabetic chronic kidney disease: Secondary | ICD-10-CM | POA: Insufficient documentation

## 2019-12-03 DIAGNOSIS — D649 Anemia, unspecified: Secondary | ICD-10-CM | POA: Diagnosis not present

## 2019-12-03 DIAGNOSIS — I120 Hypertensive chronic kidney disease with stage 5 chronic kidney disease or end stage renal disease: Secondary | ICD-10-CM | POA: Insufficient documentation

## 2019-12-03 HISTORY — DX: Diaphragmatic hernia without obstruction or gangrene: K44.9

## 2019-12-03 HISTORY — PX: ENTEROSCOPY: SHX5533

## 2019-12-03 HISTORY — PX: HOT HEMOSTASIS: SHX5433

## 2019-12-03 LAB — POCT I-STAT, CHEM 8
BUN: 25 mg/dL — ABNORMAL HIGH (ref 8–23)
Calcium, Ion: 1.17 mmol/L (ref 1.15–1.40)
Chloride: 97 mmol/L — ABNORMAL LOW (ref 98–111)
Creatinine, Ser: 5.8 mg/dL — ABNORMAL HIGH (ref 0.61–1.24)
Glucose, Bld: 120 mg/dL — ABNORMAL HIGH (ref 70–99)
HCT: 38 % — ABNORMAL LOW (ref 39.0–52.0)
Hemoglobin: 12.9 g/dL — ABNORMAL LOW (ref 13.0–17.0)
Potassium: 3.9 mmol/L (ref 3.5–5.1)
Sodium: 137 mmol/L (ref 135–145)
TCO2: 31 mmol/L (ref 22–32)

## 2019-12-03 LAB — GLUCOSE, CAPILLARY: Glucose-Capillary: 112 mg/dL — ABNORMAL HIGH (ref 70–99)

## 2019-12-03 SURGERY — ENTEROSCOPY
Anesthesia: Monitor Anesthesia Care

## 2019-12-03 MED ORDER — SODIUM CHLORIDE 0.9 % IV SOLN: INTRAVENOUS | Status: DC | PRN

## 2019-12-03 MED ORDER — LIDOCAINE 2% (20 MG/ML) 5 ML SYRINGE
INTRAMUSCULAR | Status: DC | PRN
  Administered 2019-12-03: 40 mg via INTRAVENOUS
  Administered 2019-12-03: 10 mg via INTRAVENOUS
  Administered 2019-12-03 (×2): 20 mg via INTRAVENOUS

## 2019-12-03 MED ORDER — ALBUTEROL SULFATE HFA 108 (90 BASE) MCG/ACT IN AERS
INHALATION_SPRAY | RESPIRATORY_TRACT | Status: DC | PRN
  Administered 2019-12-03 (×2): 2 via RESPIRATORY_TRACT

## 2019-12-03 MED ORDER — PROPOFOL 10 MG/ML IV BOLUS
INTRAVENOUS | Status: DC | PRN
  Administered 2019-12-03 (×15): 20 mg via INTRAVENOUS

## 2019-12-03 MED ORDER — SODIUM CHLORIDE 0.9 % IV SOLN
INTRAVENOUS | Status: DC
  Administered 2019-12-03: 500 mL via INTRAVENOUS

## 2019-12-03 SURGICAL SUPPLY — 15 items

## 2019-12-03 NOTE — Interval H&P Note (Signed)
History and Physical Interval Note:  12/03/2019 7:53 AM  [name]  has presented today for surgery, with the diagnosis of GERD/melena/LLQ pain/anemia/.  The various methods of treatment have been discussed with the patient and family. After consideration of risks, benefits and other options for treatment, the patient has consented to  Procedure(s) with comments: ESOPHAGOGASTRODUODENOSCOPY (EGD) WITH PROPOFOL (N/A) ENTEROSCOPY (N/A) - push enteroscopy as a surgical intervention.  The patient's history has been reviewed, patient examined, no change in status, stable for surgery.  I have reviewed the patient's chart and labs.  Questions were answered to the patient's satisfaction.     Dominic Pea Mayford Alberg

## 2019-12-03 NOTE — Transfer of Care (Signed)
Immediate Anesthesia Transfer of Care Note  Patient: [name]  Procedure(s) Performed: ESOPHAGOGASTRODUODENOSCOPY (EGD) WITH PROPOFOL (N/A ) ENTEROSCOPY (N/A ) HOT HEMOSTASIS (ARGON PLASMA COAGULATION/BICAP) (N/A )  Patient Location: PACU and Endoscopy Unit  Anesthesia Type:MAC  Level of Consciousness: awake  Airway & Oxygen Therapy: Patient Spontanous Breathing and Patient connected to face mask oxygen  Post-op Assessment: Report given to RN and Post -op Vital signs reviewed and stable  Post vital signs: Reviewed and stable  Last Vitals:  Vitals Value Taken Time  BP 112/36 12/03/19 0930  Temp 36.7 C 12/03/19 0927  Pulse 68 12/03/19 0931  Resp 11 12/03/19 0931  SpO2 99 % 12/03/19 0931  Vitals shown include unvalidated device data.  Last Pain:  Vitals:   12/03/19 0927  TempSrc: Axillary  PainSc:          Complications: No apparent anesthesia complications

## 2019-12-03 NOTE — Anesthesia Postprocedure Evaluation (Signed)
Anesthesia Post Note  Patient: [name]  Procedure(s) Performed: ESOPHAGOGASTRODUODENOSCOPY (EGD) WITH PROPOFOL (N/A ) ENTEROSCOPY (N/A ) HOT HEMOSTASIS (ARGON PLASMA COAGULATION/BICAP) (N/A )     Patient location during evaluation: Endoscopy Anesthesia Type: MAC Level of consciousness: awake and alert Pain management: pain level controlled Vital Signs Assessment: post-procedure vital signs reviewed and stable Respiratory status: spontaneous breathing, nonlabored ventilation, respiratory function stable and patient connected to nasal cannula oxygen Cardiovascular status: stable and blood pressure returned to baseline Postop Assessment: no apparent nausea or vomiting Anesthetic complications: no    Last Vitals:  Vitals:   12/03/19 0930 12/03/19 0940  BP: (!) 112/36 (!) 111/42  Pulse: (!) 51 70  Resp: 15 16  Temp:    SpO2: 96% 95%    Last Pain:  Vitals:   12/03/19 0940  TempSrc:   PainSc: 0-No pain                 Divine Hansley L Aracelia Brinson

## 2019-12-03 NOTE — Discharge Instructions (Signed)
YOU HAD AN ENDOSCOPIC PROCEDURE TODAY: Refer to the procedure report and other information in the discharge instructions given to you for any specific questions about what was found during the examination. If this information does not answer your questions, please call [facility] office at [phone] to clarify.   YOU SHOULD EXPECT: Some feelings of bloating in the abdomen. Passage of more gas than usual. Walking can help get rid of the air that was put into your GI tract during the procedure and reduce the bloating. If you had a lower endoscopy (such as a colonoscopy or flexible sigmoidoscopy) you may notice spotting of blood in your stool or on the toilet paper. Some abdominal soreness may be present for a day or two, also.  DIET: Your first meal following the procedure should be a light meal and then it is ok to progress to your normal diet. A half-sandwich or bowl of soup is an example of a good first meal. Heavy or fried foods are harder to digest and may make you feel nauseous or bloated. Drink plenty of fluids but you should avoid alcoholic beverages for 24 hours. If you had a esophageal dilation, please see attached instructions for diet.    ACTIVITY: Your care partner should take you home directly after the procedure. You should plan to take it easy, moving slowly for the rest of the day. You can resume normal activity the day after the procedure however YOU SHOULD NOT DRIVE, use power tools, machinery or perform tasks that involve climbing or major physical exertion for 24 hours (because of the sedation medicines used during the test).   SYMPTOMS TO REPORT IMMEDIATELY: A gastroenterologist can be reached at any hour. Please call [phone]  for any of the following symptoms:   Following upper endoscopy (EGD, EUS, ERCP, esophageal dilation) Vomiting of blood or coffee ground material  New, significant abdominal pain  New, significant chest pain or pain under the shoulder blades  Painful or  persistently difficult swallowing  New shortness of breath  Black, tarry-looking or red, bloody stools  FOLLOW UP:  If any biopsies were taken you will be contacted by phone or by letter within the next 1-3 weeks. Call [phone]  if you have not heard about the biopsies in 3 weeks.  Please also call with any specific questions about appointments or follow up tests.  

## 2019-12-03 NOTE — Anesthesia Preprocedure Evaluation (Addendum)
Anesthesia Evaluation  Patient identified by MRN, date of birth, ID band Patient awake    Reviewed: Allergy & Precautions, NPO status , Patient's Chart, lab work & pertinent test results  Airway Mallampati: I   Neck ROM: Full    Dental  (+) Partial Upper,    Pulmonary shortness of breath, sleep apnea (does not wear CPAP) , former smoker,    breath sounds clear to auscultation       Cardiovascular hypertension, Pt. on medications + CAD and + Peripheral Vascular Disease   Rhythm:Regular Rate:Normal  TTE 2019 - Left ventricle: The cavity size was normal. There was moderate concentric hypertrophy. Systolic function was normal. The estimated ejection fraction was in the range of 55% to 60%. Wall motion was normal; there were no regional wall motion abnormalities. Doppler parameters are consistent with abnormal left ventricular relaxation (grade 1 diastolic dysfunction).  - Aortic valve: Transvalvular velocity was within the normal range.There was no stenosis. There was no significant regurgitation.  - Mitral valve: There was trivial regurgitation.  - Right ventricle: Systolic function was normal.  - Atrial septum: No defect or patent foramen ovale was identified.  - Tricuspid valve: There was mild regurgitation.  - Pulmonic valve: There was no significant regurgitation.   Neuro/Psych PSYCHIATRIC DISORDERS Dementia negative neurological ROS     GI/Hepatic Neg liver ROS, GERD  ,  Endo/Other  diabetes, Insulin Dependent, Oral Hypoglycemic AgentsHypothyroidism   Renal/GU ESRF and DialysisRenal disease (MWF, dialysis yesterday without problems)  negative genitourinary   Musculoskeletal  (+) Arthritis ,   Abdominal   Peds  Hematology  (+) Blood dyscrasia (Hgb 10.7), anemia ,   Anesthesia Other Findings   Reproductive/Obstetrics                            Anesthesia Physical Anesthesia Plan  ASA:  III  Anesthesia Plan: MAC   Post-op Pain Management:    Induction: Intravenous  PONV Risk Score and Plan: 1 and Propofol infusion and Treatment may vary due to age or medical condition  Airway Management Planned: Natural Airway  Additional Equipment:   Intra-op Plan:   Post-operative Plan:   Informed Consent: I have reviewed the patients History and Physical, chart, labs and discussed the procedure including the risks, benefits and alternatives for the proposed anesthesia with the patient or authorized representative who has indicated his/her understanding and acceptance.     Dental advisory given  Plan Discussed with: CRNA  Anesthesia Plan Comments:         Anesthesia Quick Evaluation

## 2019-12-03 NOTE — Anesthesia Procedure Notes (Signed)
Procedure Name: MAC Date/Time: 12/03/2019 8:33 AM Performed by: Cynda Familia, CRNA Pre-anesthesia Checklist: Patient identified, Emergency Drugs available, Suction available, Patient being monitored and Timeout performed Patient Re-evaluated:Patient Re-evaluated prior to induction Oxygen Delivery Method: Simple face mask Placement Confirmation: positive ETCO2 and breath sounds checked- equal and bilateral Dental Injury: Teeth and Oropharynx as per pre-operative assessment

## 2019-12-03 NOTE — Op Note (Signed)
[facility] Patient Name: [name] Procedure Date: 12/03/2019 MRN: [id number] Attending MD: Gerrit Heck , MD Date of Birth: 09/08/1941 CSN: [id number] Age: 79 Admit Type: Outpatient Procedure:                Small bowel enteroscopy Indications:              Melena, For therapy of angioectasia                           79 yo male with known history of gastric and small                            bowel AVMs, previously treated with APC, with                            recent recurrence of melena. Hgb otherwise stable.                            Presents for repeat enteroscopy for therapeutic                            intent. Providers:                Gerrit Heck, MD, Glori Bickers, RN, Elspeth Cho Tech., Technician, Glenis Smoker, CRNA Referring MD:              Medicines:                Monitored Anesthesia Care Complications:            No immediate complications. Estimated Blood Loss:     Estimated blood loss was minimal. Procedure:                Pre-Anesthesia Assessment:                           - Prior to the procedure, a History and Physical                            was performed, and patient medications and                            allergies were reviewed. The patient's tolerance of                            previous anesthesia was also reviewed. The risks                            and benefits of the procedure and the sedation                            options and risks were discussed with the patient.  All questions were answered, and informed consent                            was obtained. Prior Anticoagulants: The patient has                            taken no previous anticoagulant or antiplatelet                            agents. ASA Grade Assessment: III - A patient with                            severe systemic disease. After reviewing the risks   and benefits, the patient was deemed in                            satisfactory condition to undergo the procedure.                           After obtaining informed consent, the endoscope was                            passed under direct vision. Throughout the                            procedure, the patient's blood pressure, pulse, and                            oxygen saturations were monitored continuously. The                            [id number]-[id number] ([id number] Olympus pediatric colonscope                            was introduced through the mouth and advanced to                            the proximal jejunum. The small bowel enteroscopy                            was accomplished without difficulty. The patient                            tolerated the procedure well. Scope In: Scope Out: Findings:      A 2 cm sliding type hiatal hernia was present.      A non-obstructing Schatzki ring was found in the lower third of the       esophagus.      The entire examined stomach was normal.      Two angioectasias with no bleeding were found in the third portion of       the duodenum and in the fourth portion of the duodenum. Coagulation for       hemostasis using argon plasma was successful.      Three angioectasias with no bleeding were found in the proximal  jejunum.       Coagulation for hemostasis using argon plasma was successful.      A tattoo was seen in the proximal jejunum. Only able to advance another       5 cm beyond the tattoo site. The mucosa in this area otherwise appeared       normal. Impression:               - 2 cm hiatal hernia.                           - Non-obstructing Schatzki ring.                           - Normal stomach.                           - Two non-bleeding angioectasias in the duodenum.                            Treated with argon plasma coagulation (APC).                           - Three non-bleeding angioectasias in the jejunum.                             Treated with argon plasma coagulation (APC).                           - A tattoo was seen in the jejunum.                           - No specimens collected. Recommendation:           - Discharge patient to home (with escort).                           - Full liquid diet now and advance as tolerated.                           - Continue present medications.                           - Repeat the small bowel enteroscopy PRN.                           - Return to GI clinic at appointment to be                            scheduled. Procedure Code(s):        --- Professional ---                           [phone], Small intestinal endoscopy, enteroscopy                            beyond second portion of duodenum, not including  ileum; with control of bleeding (eg, injection,                            bipolar cautery, unipolar cautery, laser, heater                            probe, stapler, plasma coagulator) Diagnosis Code(s):        --- Professional ---                           K44.9, Diaphragmatic hernia without obstruction or                            gangrene                           K22.2, Esophageal obstruction                           K31.819, Angiodysplasia of stomach and duodenum                            without bleeding                           K55.20, Angiodysplasia of colon without hemorrhage                           K92.1, Melena (includes Hematochezia) CPT copyright 2019 American Medical Association. All rights reserved. The codes documented in this report are preliminary and upon coder review may  be revised to meet current compliance requirements. Gerrit Heck, MD 12/03/2019 9:27:39 AM Number of Addenda: 0

## 2019-12-04 ENCOUNTER — Telehealth: Payer: Self-pay

## 2019-12-04 ENCOUNTER — Encounter: Payer: Self-pay | Admitting: *Deleted

## 2019-12-04 DIAGNOSIS — N2581 Secondary hyperparathyroidism of renal origin: Secondary | ICD-10-CM | POA: Diagnosis not present

## 2019-12-04 DIAGNOSIS — E1129 Type 2 diabetes mellitus with other diabetic kidney complication: Secondary | ICD-10-CM | POA: Diagnosis not present

## 2019-12-04 DIAGNOSIS — Z992 Dependence on renal dialysis: Secondary | ICD-10-CM | POA: Diagnosis not present

## 2019-12-04 DIAGNOSIS — D509 Iron deficiency anemia, unspecified: Secondary | ICD-10-CM | POA: Diagnosis not present

## 2019-12-04 DIAGNOSIS — N186 End stage renal disease: Secondary | ICD-10-CM | POA: Diagnosis not present

## 2019-12-04 NOTE — Telephone Encounter (Signed)
Left message for patient/wife to call back to the office;   Needs 4 wk f/u post small bowel enteroscopy-melena/therapy of angioectasia/Cirigliano

## 2019-12-07 DIAGNOSIS — E1129 Type 2 diabetes mellitus with other diabetic kidney complication: Secondary | ICD-10-CM | POA: Diagnosis not present

## 2019-12-07 DIAGNOSIS — N2581 Secondary hyperparathyroidism of renal origin: Secondary | ICD-10-CM | POA: Diagnosis not present

## 2019-12-07 DIAGNOSIS — D509 Iron deficiency anemia, unspecified: Secondary | ICD-10-CM | POA: Diagnosis not present

## 2019-12-07 DIAGNOSIS — Z992 Dependence on renal dialysis: Secondary | ICD-10-CM | POA: Diagnosis not present

## 2019-12-07 DIAGNOSIS — N186 End stage renal disease: Secondary | ICD-10-CM | POA: Diagnosis not present

## 2019-12-07 NOTE — Telephone Encounter (Signed)
Patient's wife returned your call and patient is scheduled for appt 01/05/20.

## 2019-12-09 DIAGNOSIS — D509 Iron deficiency anemia, unspecified: Secondary | ICD-10-CM | POA: Diagnosis not present

## 2019-12-09 DIAGNOSIS — E1129 Type 2 diabetes mellitus with other diabetic kidney complication: Secondary | ICD-10-CM | POA: Diagnosis not present

## 2019-12-09 DIAGNOSIS — N2581 Secondary hyperparathyroidism of renal origin: Secondary | ICD-10-CM | POA: Diagnosis not present

## 2019-12-09 DIAGNOSIS — Z992 Dependence on renal dialysis: Secondary | ICD-10-CM | POA: Diagnosis not present

## 2019-12-09 DIAGNOSIS — N186 End stage renal disease: Secondary | ICD-10-CM | POA: Diagnosis not present

## 2019-12-11 DIAGNOSIS — Z992 Dependence on renal dialysis: Secondary | ICD-10-CM | POA: Diagnosis not present

## 2019-12-11 DIAGNOSIS — D509 Iron deficiency anemia, unspecified: Secondary | ICD-10-CM | POA: Diagnosis not present

## 2019-12-11 DIAGNOSIS — E1129 Type 2 diabetes mellitus with other diabetic kidney complication: Secondary | ICD-10-CM | POA: Diagnosis not present

## 2019-12-11 DIAGNOSIS — N2581 Secondary hyperparathyroidism of renal origin: Secondary | ICD-10-CM | POA: Diagnosis not present

## 2019-12-11 DIAGNOSIS — N186 End stage renal disease: Secondary | ICD-10-CM | POA: Diagnosis not present

## 2019-12-14 DIAGNOSIS — N2581 Secondary hyperparathyroidism of renal origin: Secondary | ICD-10-CM | POA: Diagnosis not present

## 2019-12-14 DIAGNOSIS — Z992 Dependence on renal dialysis: Secondary | ICD-10-CM | POA: Diagnosis not present

## 2019-12-14 DIAGNOSIS — D509 Iron deficiency anemia, unspecified: Secondary | ICD-10-CM | POA: Diagnosis not present

## 2019-12-14 DIAGNOSIS — E1129 Type 2 diabetes mellitus with other diabetic kidney complication: Secondary | ICD-10-CM | POA: Diagnosis not present

## 2019-12-14 DIAGNOSIS — N186 End stage renal disease: Secondary | ICD-10-CM | POA: Diagnosis not present

## 2019-12-15 ENCOUNTER — Other Ambulatory Visit: Payer: Self-pay

## 2019-12-15 ENCOUNTER — Other Ambulatory Visit (INDEPENDENT_AMBULATORY_CARE_PROVIDER_SITE_OTHER): Payer: Medicare Other

## 2019-12-15 DIAGNOSIS — Z794 Long term (current) use of insulin: Secondary | ICD-10-CM | POA: Diagnosis not present

## 2019-12-15 DIAGNOSIS — E1165 Type 2 diabetes mellitus with hyperglycemia: Secondary | ICD-10-CM | POA: Diagnosis not present

## 2019-12-15 LAB — HEMOGLOBIN A1C: Hgb A1c MFr Bld: 5.4 % (ref 4.6–6.5)

## 2019-12-15 LAB — GLUCOSE, RANDOM: Glucose, Bld: 114 mg/dL — ABNORMAL HIGH (ref 70–99)

## 2019-12-16 DIAGNOSIS — E1129 Type 2 diabetes mellitus with other diabetic kidney complication: Secondary | ICD-10-CM | POA: Diagnosis not present

## 2019-12-16 DIAGNOSIS — N2581 Secondary hyperparathyroidism of renal origin: Secondary | ICD-10-CM | POA: Diagnosis not present

## 2019-12-16 DIAGNOSIS — D509 Iron deficiency anemia, unspecified: Secondary | ICD-10-CM | POA: Diagnosis not present

## 2019-12-16 DIAGNOSIS — N186 End stage renal disease: Secondary | ICD-10-CM | POA: Diagnosis not present

## 2019-12-16 DIAGNOSIS — Z992 Dependence on renal dialysis: Secondary | ICD-10-CM | POA: Diagnosis not present

## 2019-12-16 LAB — FRUCTOSAMINE: Fructosamine: 293 umol/L — ABNORMAL HIGH (ref 0–285)

## 2019-12-18 DIAGNOSIS — E1129 Type 2 diabetes mellitus with other diabetic kidney complication: Secondary | ICD-10-CM | POA: Diagnosis not present

## 2019-12-18 DIAGNOSIS — Z992 Dependence on renal dialysis: Secondary | ICD-10-CM | POA: Diagnosis not present

## 2019-12-18 DIAGNOSIS — N186 End stage renal disease: Secondary | ICD-10-CM | POA: Diagnosis not present

## 2019-12-18 DIAGNOSIS — N2581 Secondary hyperparathyroidism of renal origin: Secondary | ICD-10-CM | POA: Diagnosis not present

## 2019-12-18 DIAGNOSIS — D509 Iron deficiency anemia, unspecified: Secondary | ICD-10-CM | POA: Diagnosis not present

## 2019-12-21 DIAGNOSIS — Z992 Dependence on renal dialysis: Secondary | ICD-10-CM | POA: Diagnosis not present

## 2019-12-21 DIAGNOSIS — D509 Iron deficiency anemia, unspecified: Secondary | ICD-10-CM | POA: Diagnosis not present

## 2019-12-21 DIAGNOSIS — N2581 Secondary hyperparathyroidism of renal origin: Secondary | ICD-10-CM | POA: Diagnosis not present

## 2019-12-21 DIAGNOSIS — E1129 Type 2 diabetes mellitus with other diabetic kidney complication: Secondary | ICD-10-CM | POA: Diagnosis not present

## 2019-12-21 DIAGNOSIS — N186 End stage renal disease: Secondary | ICD-10-CM | POA: Diagnosis not present

## 2019-12-22 ENCOUNTER — Ambulatory Visit: Payer: Medicare Other | Admitting: Endocrinology

## 2019-12-23 ENCOUNTER — Other Ambulatory Visit: Payer: Self-pay | Admitting: *Deleted

## 2019-12-23 DIAGNOSIS — N186 End stage renal disease: Secondary | ICD-10-CM | POA: Diagnosis not present

## 2019-12-23 DIAGNOSIS — D509 Iron deficiency anemia, unspecified: Secondary | ICD-10-CM | POA: Diagnosis not present

## 2019-12-23 DIAGNOSIS — N2581 Secondary hyperparathyroidism of renal origin: Secondary | ICD-10-CM | POA: Diagnosis not present

## 2019-12-23 DIAGNOSIS — E1129 Type 2 diabetes mellitus with other diabetic kidney complication: Secondary | ICD-10-CM | POA: Diagnosis not present

## 2019-12-23 DIAGNOSIS — I739 Peripheral vascular disease, unspecified: Secondary | ICD-10-CM

## 2019-12-23 DIAGNOSIS — Z992 Dependence on renal dialysis: Secondary | ICD-10-CM | POA: Diagnosis not present

## 2019-12-24 ENCOUNTER — Other Ambulatory Visit: Payer: Self-pay

## 2019-12-24 ENCOUNTER — Ambulatory Visit (HOSPITAL_COMMUNITY)
Admission: RE | Admit: 2019-12-24 | Discharge: 2019-12-24 | Disposition: A | Discharge status: Home / Self Care | Payer: Medicare Other | Source: Ambulatory Visit | Attending: Surgery | Admitting: Surgery

## 2019-12-24 ENCOUNTER — Encounter: Payer: Self-pay | Admitting: Vascular Surgery

## 2019-12-24 ENCOUNTER — Ambulatory Visit (INDEPENDENT_AMBULATORY_CARE_PROVIDER_SITE_OTHER): Payer: Medicare Other | Admitting: Vascular Surgery

## 2019-12-24 VITALS — BP 158/79 | HR 68 | Temp 98.2°F | Resp 20 | Ht 71.0 in | Wt 175.0 lb

## 2019-12-24 DIAGNOSIS — Z992 Dependence on renal dialysis: Secondary | ICD-10-CM | POA: Diagnosis not present

## 2019-12-24 DIAGNOSIS — D509 Iron deficiency anemia, unspecified: Secondary | ICD-10-CM | POA: Diagnosis not present

## 2019-12-24 DIAGNOSIS — I739 Peripheral vascular disease, unspecified: Secondary | ICD-10-CM | POA: Diagnosis not present

## 2019-12-24 DIAGNOSIS — N2581 Secondary hyperparathyroidism of renal origin: Secondary | ICD-10-CM | POA: Diagnosis not present

## 2019-12-24 DIAGNOSIS — E1122 Type 2 diabetes mellitus with diabetic chronic kidney disease: Secondary | ICD-10-CM | POA: Diagnosis not present

## 2019-12-24 DIAGNOSIS — N186 End stage renal disease: Secondary | ICD-10-CM | POA: Diagnosis not present

## 2019-12-24 DIAGNOSIS — E119 Type 2 diabetes mellitus without complications: Secondary | ICD-10-CM | POA: Diagnosis not present

## 2019-12-24 NOTE — Progress Notes (Signed)
Patient is a 79 year old male who returns for follow-up today.  He had an aortobifemoral bypass in the remote past by Dr. Amedeo Plenty.  We have followed him intermittently for his peripheral arterial disease.  He does not currently have any claudication symptoms.  However he walks minimally.  He does get around the house some with a walker.  He has been a little bit unsteady on his gait recently and actually had a subdural hematoma after a fall several years ago.  He is currently on dialysis 3 days a week.  He has a right arm AV fistula.  He does not have rest pain.  He does not have claudication.  Chronic medical problems include coronary artery disease diabetes hyperlipidemia hypertension all of which are currently stable.  He is on a statin.  He is not on aspirin presumably due to his head bleed.  Past Medical History:  Diagnosis Date  . Allergy   . Anemia   . Arthritis   . Cataract    bil cateracts removed  . Coronary artery disease   . Dementia arising in the senium and presenium ([place])   . Diabetes mellitus    Type 2  . Diverticulitis   . ED (erectile dysfunction)   . Elevated homocysteine   . ESRD (end stage renal disease) on dialysis () 03/2015  . GERD (gastroesophageal reflux disease)    pepto   . Gout   . Hiatal hernia   . Hyperlipidemia   . Hypertension   . Hypothyroidism   . Pneumonia   . PVD (peripheral vascular disease) ([place])    has plastic aorta  . Seasonal allergies   . Sleep apnea    does not wear c-pap   Past Surgical History:  Procedure Laterality Date  . aortobifemoral bypass    . AV FISTULA PLACEMENT Left 12/01/2013   Procedure: ARTERIOVENOUS (AV) FISTULA CREATION- LEFT BRACHIOCEPHALIC;  Surgeon: Angelia Mould, MD;  Location: [place];  Service: Vascular;  Laterality: Left;  . [place] TRANSPOSITION Right 07/27/2014   Procedure: BASCILIC VEIN TRANSPOSITION;  Surgeon: Angelia Mould, MD;  Location: [place];  Service: Vascular;  Laterality: Right;  .  BRAIN SURGERY  07/22/2018  . BREAST SURGERY     left - granulomatous mastitis  . BURR HOLE Bilateral 07/22/2018   Procedure: BILATERAL BURR HOLES;  Surgeon: Kristeen Miss, MD;  Location: [place];  Service: Neurosurgery;  Laterality: Bilateral;  . COLONOSCOPY    . 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 ENTEROSCOPY N/A 06/03/2018   Procedure: ENTEROSCOPY;  Surgeon: Lavena Bullion, DO;  Location: [place];  Service: Gastroenterology;  Laterality: N/A;  . ENTEROSCOPY N/A 11/20/2018   Procedure: ENTEROSCOPY;  Surgeon: Rush Landmark Telford Nab., MD;  Location: [facility] ENDOSCOPY;  Service: Gastroenterology;  Laterality: N/A;  . ENTEROSCOPY N/A 12/03/2019   Procedure: ENTEROSCOPY;  Surgeon: Lavena Bullion, DO;  Location: [facility] ENDOSCOPY;  Service: Gastroenterology;  Laterality: N/A;  push enteroscopy  . EYE SURGERY Bilateral    cataracts  . HEMODIALYSIS INPATIENT  01/17/2018      . HOT HEMOSTASIS N/A 06/03/2018   Procedure: HOT HEMOSTASIS (ARGON PLASMA COAGULATION/BICAP);  Surgeon: Lavena Bullion, DO;  Location: [facility] ENDOSCOPY;  Service: Gastroenterology;  Laterality: N/A;  . HOT HEMOSTASIS N/A 11/20/2018   Procedure: HOT HEMOSTASIS (ARGON PLASMA COAGULATION/BICAP);  Surgeon: Irving Copas., MD;  Location: [place];  Service: Gastroenterology;  Laterality: N/A;  . HOT HEMOSTASIS N/A 12/03/2019   Procedure:  HOT HEMOSTASIS (ARGON PLASMA COAGULATION/BICAP);  Surgeon: Lavena Bullion, DO;  Location: [facility] ENDOSCOPY;  Service: Gastroenterology;  Laterality: N/A;  . REVISION OF ARTERIOVENOUS GORETEX GRAFT Right 09/01/2019   Procedure: REVISION OF ARTERIOVENOUS FISTULA RIGHT ARM;  Surgeon: Serafina Mitchell, MD;  Location: [facility] OR;  Service: Vascular;  Laterality: Right;  . REVISON OF ARTERIOVENOUS FISTULA Left 02/09/2014   Procedure: REVISON OF LEFT ARTERIOVENOUS FISTULA - RESECTION OF RENDUNDANT VEIN;  Surgeon: Angelia Mould, MD;  Location: [place];  Service: Vascular;  Laterality:  Left;  . SBO with lysis adhesions    . SHUNTOGRAM Left 04/19/2014   Procedure: FISTULOGRAM;  Surgeon: Angelia Mould, MD;  Location: [facility] CATH LAB;  Service: Cardiovascular;  Laterality: Left;  . UNILATERAL UPPER EXTREMEITY ANGIOGRAM N/A 07/12/2014   Procedure: UNILATERAL UPPER Anselmo Rod;  Surgeon: Angelia Mould, MD;  Location: [facility] CATH LAB;  Service: Cardiovascular;  Laterality: N/A;    Social History   Socioeconomic History  . Marital status: Married    Spouse name: [name] Mood  . Number of children: 1  . Years of education: 19  . Highest education level: Not on file  Occupational History  . Occupation: Retired  Tobacco Use  . Smoking status: Former Smoker    Quit date: 09/24/1994    Years since quitting: 25.2  . Smokeless tobacco: Never Used  Substance and Sexual Activity  . Alcohol use: No    Alcohol/week: 0.0 standard drinks    Comment: Quit Oct. 1977 ("somewhat heavy")  . Drug use: No  . Sexual activity: Not on file  Other Topics Concern  . Not on file  Social History Narrative   Lives at home with wife.   Caffeine use: Drinks no tea, "a little soda"   Drinks 1 cup coffee/week   Right handed   Social Determinants of Health   Financial Resource Strain:   . Difficulty of Paying Living Expenses:   Food Insecurity:   . Worried About Charity fundraiser in the Last Year:   . Arboriculturist in the Last Year:   Transportation Needs:   . Film/video editor (Medical):   Marland Kitchen Lack of Transportation (Non-Medical):   Physical Activity:   . Days of Exercise per Week:   . Minutes of Exercise per Session:   Stress:   . Feeling of Stress :   Social Connections:   . Frequency of Communication with Friends and Family:   . Frequency of Social Gatherings with Friends and Family:   . Attends Religious Services:   . Active Member of Clubs or Organizations:   . Attends Archivist Meetings:   Marland Kitchen Marital Status:   Intimate Partner Violence:   . Fear  of Current or Ex-Partner:   . Emotionally Abused:   Marland Kitchen Physically Abused:   . Sexually Abused:     Current Outpatient Medications on File Prior to Visit  Medication Sig Dispense Refill  . acetaminophen (TYLENOL) 325 MG tablet Take 1-2 tablets (325-650 mg total) by mouth every 4 (four) hours as needed for mild pain. (Patient taking differently: Take 650 mg by mouth in the morning and at bedtime. )    . allopurinol (ZYLOPRIM) 100 MG tablet Take 100 mg by mouth daily.     . Blood Glucose Monitoring Suppl (FREESTYLE FREEDOM LITE) w/Device KIT Use to check blood sugar 2 times per day dx code E11.65 1 each 0  . Calcium Carbonate Antacid (TUMS PO) Take 2 tablets by  mouth at bedtime.     . camphor-menthol (SARNA) lotion Apply 1 application topically 4 (four) times daily as needed for itching.     . Carboxymethylcellulose Sod PF 0.25 % SOLN Place 1 drop into both eyes 4 (four) times daily as needed (dry/irritated eyes.).     Marland Kitchen cetirizine (ZYRTEC) 10 MG chewable tablet Chew 10 mg by mouth daily as needed (allergies.).     Marland Kitchen clobetasol (TEMOVATE) 0.05 % external solution Apply 1 application topically 2 (two) times daily as needed (scalp irritation.).   0  . denosumab (PROLIA) 60 MG/ML SOSY injection Inject 60 mg into the skin every 6 (six) months.    . diclofenac Sodium (VOLTAREN) 1 % GEL     . donepezil (ARICEPT) 10 MG tablet Take 10 mg by mouth at bedtime.     Marland Kitchen FLUOCINOLONE ACETONIDE SCALP 0.01 % OIL Apply 1 application topically daily as needed (scalp irritation.).   0  . fluticasone (CUTIVATE) 0.05 % cream Apply 1 application topically 2 (two) times daily as needed (irritation). Apply to face  0  . fluticasone (FLONASE) 50 MCG/ACT nasal spray Place 1 spray into both nostrils daily. (Patient taking differently: Place 1 spray into both nostrils daily as needed for allergies. ) 16 g 2  . glucose blood (FREESTYLE LITE) test strip USE THREE TIMES DAILY 200 strip 2  . hydrocortisone 2.5 % cream Apply 1  application topically 3 (three) times daily as needed (skin irritation (legs & arms)).     Marland Kitchen insulin aspart (NOVOLOG FLEXPEN) 100 UNIT/ML FlexPen INJECT 6-8 UNITS UNDER THE SKIN THREE TIMES DAILY BEFORE MEALS. DX:E11.65 (Patient taking differently: Inject 6-8 Units into the skin 3 (three) times daily with meals. Sliding scale insulin) 15 mL 12  . Insulin Pen Needle (PEN NEEDLES) 32G X 4 MM MISC 1 each by Does not apply route 3 (three) times daily. USE PEN NEEDLES TO INJECT INSULIN THREE TIMES DAILY. 100 each 12  . ketoconazole (NIZORAL) 2 % shampoo Apply 1 application topically 2 (two) times a week. (Patient taking differently: Apply 1 application topically daily as needed for irritation. ) 120 mL 0  . Lancets (FREESTYLE) lancets Use as instructed to check blood sugar 2 times per day dx code E11.65 100 each 3  . levothyroxine (SYNTHROID, LEVOTHROID) 50 MCG tablet Take 1 tablet (50 mcg total) by mouth daily. (Patient taking differently: Take 50 mcg by mouth daily before breakfast. ) 90 tablet 1  . lidocaine (LIDODERM) 5 % Place 1 patch onto the skin daily.     . memantine (NAMENDA) 5 MG tablet Take 1 tablet (5 mg total) by mouth 2 (two) times daily. 60 tablet 6  . midodrine (PROAMATINE) 10 MG tablet Take one pill prior to hemodialysis on MWF (Patient taking differently: Take 10 mg by mouth every Monday, Wednesday, and Friday with hemodialysis. )    . Naftifine HCl (NAFTIN) 2 % CREA Apply to feet daily for fungal infection (Patient taking differently: Apply 1 application topically daily as needed (for fungal infection). Apply to feet) 60 g prn  . Nutritional Supplements (FEEDING SUPPLEMENT, NEPRO CARB STEADY,) LIQD Take 237 mLs by mouth in the morning and at bedtime.     Marland Kitchen omeprazole (PRILOSEC) 40 MG capsule Take 1 capsule (40 mg total) by mouth daily. (Patient taking differently: Take 40 mg by mouth daily before breakfast. ) 30 capsule 0  . saccharomyces boulardii (FLORASTOR) 250 MG capsule Take 1 capsule  (250 mg total) by mouth 2 (two)  times daily. 60 capsule 0  . sevelamer carbonate (RENVELA) 800 MG tablet Take 1,200-2,400 mg by mouth See admin instructions. Take 3 tablets (2400 mg) by mouth twice daily with a meal & take 2 tablet (1600 mg) by mouth with a snack    . simvastatin (ZOCOR) 20 MG tablet Take 20 mg by mouth at bedtime.     . TRADJENTA 5 MG TABS tablet Take 1 tablet (5 mg total) by mouth daily. 90 tablet 2   No current facility-administered medications on file prior to visit.     Physical exam:   Vitals:   12/24/19 1343  BP: (!) 158/79  Pulse: 68  Resp: 20  Temp: 98.2 F (36.8 C)  SpO2: 100%  Weight: 175 lb (79.4 kg)  Height: 5' 11"  (1.803 m)    Extremities: Palpable thrill right upper arm AV fistula, 2+ right dorsalis pedis pulse absent pedal pulses left leg 2+ femoral pulses bilaterally  Skin: No open ulcer or wound  Data: Patient had bilateral ABIs performed today.  I reviewed and interpreted the study.  Right side was 0.92 left side was 0.64.  Assessment: Stable peripheral arterial disease status post aortobifemoral bypass.  Currently has no symptoms of claudication or tissue loss.  Plan: Patient will follow up in our APP clinic in 1 year with repeat ABIs.  Ruta Hinds, MD [facility] Office: [phone]

## 2019-12-25 DIAGNOSIS — D631 Anemia in chronic kidney disease: Secondary | ICD-10-CM | POA: Diagnosis not present

## 2019-12-25 DIAGNOSIS — D509 Iron deficiency anemia, unspecified: Secondary | ICD-10-CM | POA: Diagnosis not present

## 2019-12-25 DIAGNOSIS — N2581 Secondary hyperparathyroidism of renal origin: Secondary | ICD-10-CM | POA: Diagnosis not present

## 2019-12-25 DIAGNOSIS — N186 End stage renal disease: Secondary | ICD-10-CM | POA: Diagnosis not present

## 2019-12-25 DIAGNOSIS — E119 Type 2 diabetes mellitus without complications: Secondary | ICD-10-CM | POA: Diagnosis not present

## 2019-12-25 DIAGNOSIS — Z992 Dependence on renal dialysis: Secondary | ICD-10-CM | POA: Diagnosis not present

## 2019-12-28 ENCOUNTER — Other Ambulatory Visit: Payer: Self-pay

## 2019-12-28 DIAGNOSIS — N186 End stage renal disease: Secondary | ICD-10-CM | POA: Diagnosis not present

## 2019-12-28 DIAGNOSIS — D509 Iron deficiency anemia, unspecified: Secondary | ICD-10-CM | POA: Diagnosis not present

## 2019-12-28 DIAGNOSIS — Z992 Dependence on renal dialysis: Secondary | ICD-10-CM | POA: Diagnosis not present

## 2019-12-28 DIAGNOSIS — N2581 Secondary hyperparathyroidism of renal origin: Secondary | ICD-10-CM | POA: Diagnosis not present

## 2019-12-28 DIAGNOSIS — E119 Type 2 diabetes mellitus without complications: Secondary | ICD-10-CM | POA: Diagnosis not present

## 2019-12-29 ENCOUNTER — Encounter: Payer: Self-pay | Admitting: Endocrinology

## 2019-12-29 ENCOUNTER — Ambulatory Visit (INDEPENDENT_AMBULATORY_CARE_PROVIDER_SITE_OTHER): Payer: Medicare Other | Admitting: Endocrinology

## 2019-12-29 VITALS — BP 130/60 | HR 67 | Ht 71.0 in | Wt 171.4 lb

## 2019-12-29 DIAGNOSIS — M810 Age-related osteoporosis without current pathological fracture: Secondary | ICD-10-CM | POA: Diagnosis not present

## 2019-12-29 DIAGNOSIS — E1165 Type 2 diabetes mellitus with hyperglycemia: Secondary | ICD-10-CM

## 2019-12-29 DIAGNOSIS — E782 Mixed hyperlipidemia: Secondary | ICD-10-CM

## 2019-12-29 DIAGNOSIS — Z794 Long term (current) use of insulin: Secondary | ICD-10-CM

## 2019-12-29 DIAGNOSIS — E039 Hypothyroidism, unspecified: Secondary | ICD-10-CM

## 2019-12-29 NOTE — Progress Notes (Signed)
Patient ID: [name], male   DOB: August 12, 1941, 79 y.o.   MRN: [id number]           Reason for Appointment:  Follow-up for endocrinology problems   History of Present Illness:          Date of diagnosis of type 2 diabetes mellitus:  1996      Background history:  He has had long-standing diabetes probably treated with metformin initially and subsequently with sulfonylurea drugs At some point he was also given Actos in addition to his glipizide which he is still taking Records of his control are only available for the last 4 years not on Over the last few years his A1c has been generally in the 6-7% range His A1c was 6.2 in May 2016 and apparently was starting to get low normal blood sugars at times He was admitted to the hospital for fluid overload and renal failure and at that time his Actos was stopped Subsequently his blood sugars had been markedly increased He was switched from regular insulin to NovoLog on his initial consultation  Recent history:   INSULIN regimen: NovoLog 5 units pc breakfast and 6 units before dinner Oral hypoglycemic drugs: Tradjenta 5 mg daily  His A1c tends to be falsely low at 5.4   Fructosamine in relatively better at 293 compared to 323  Current blood sugar patterns and problems identified:  His wife is usually managing his diabetes including monitoring and insulin dosing  As before he tends to have high readings late afternoon but mostly on the days he is coming back from dialysis  During dialysis he is frequently snacking on peanut butter crackers and candy  On the other days his blood sugars are not as significantly high before dinnertime  Despite repeated instructions his wife is only checking blood sugar before breakfast and dinnertime and not after meals as instructed  She is still afraid to give him NovoLog insulin when his blood sugar is below 100 and this may cause some high readings postprandially  He usually is eating a  sandwich at lunchtime but without any mealtime insulin coverage and not clear if blood sugars are higher 2 hours after eating  His wife gives him only 2 units of insulin at dinnertime on dialysis days because she gives him 4 units of insulin with his sandwich when he comes back from dialysis  FASTING blood sugars are excellent without any long-acting insulin  Blood sugars at dinnertime are variable as above with highest reading 259  Also is continuing on Tradjenta   Dialysis is done at 12 pm, doing it on Monday/Wednesday/Fridays     Side effects from medications have been: None  Compliance with the medical regimen: Fair  Glucose monitoring:  done up to 3  times a day         Glucometer:  Freestyle  Blood Glucose readings by review of monitor: Download results as below   PRE-MEAL Fasting Lunch Dinner Bedtime Overall  Glucose range:  86-118   127-259  156, 103   Mean/median:  104   181  141   Previous readings:   PRE-MEAL Fasting  10 AM-12 PM Dinner Bedtime Overall  Glucose range:  95-151   119-227    Mean/median:  123  119  191   151   POST-MEAL PC Breakfast PC Lunch PC Dinner  Glucose range:    108-241  Mean/median:      Previous readings:   PRE-MEAL Fasting Lunch Dinner Bedtime  Overall  Glucose range:  89-127      Mean/median:  110   198   145   POST-MEAL PC Breakfast PC Lunch PC Dinner  Glucose range:  122, 193   129-215  Mean/median:    160      Self-care: The diet that the patient has been following is: tries to limit sweets, portions. eating out only about once a week at a steak house        Typical meal intake: Breakfast is variable, ham biscuit; supper 7-8 pm            Dietician visit, most recent: 08/2017               Exercise:  Minimal, using walker to move around  Weight history:   Wt Readings from Last 3 Encounters:  12/29/19 171 lb 6.4 oz (77.7 kg)  12/24/19 175 lb (79.4 kg)  12/03/19 175 lb 0.7 oz (79.4 kg)    Glycemic control:    Lab  Results  Component Value Date   HGBA1C 5.4 12/15/2019   HGBA1C 5.7 09/15/2019   HGBA1C 6.6 (H) 06/16/2019   Lab Results  Component Value Date   MICROALBUR 37.2 09/03/2016   LDLCALC 44 04/02/2019   CREATININE 5.80 (H) 12/03/2019    Lab Results  Component Value Date   FRUCTOSAMINE 293 (H) 12/15/2019   FRUCTOSAMINE 323 (H) 09/15/2019   FRUCTOSAMINE 335 (H) 06/16/2019    PROBLEM 2 Osteoporosis: See review of systems     Allergies as of 12/29/2019      Reactions   Ambien [zolpidem Tartrate] Other (See Comments)   hallucinations and "felt crazy"    Penicillins Rash   Has patient had a PCN reaction causing immediate rash, facial/tongue/throat swelling, SOB or lightheadedness with hypotension: Yes Has patient had a PCN reaction causing severe rash involving mucus membranes or skin necrosis: Yes Has patient had a PCN reaction that required hospitalization: No Has patient had a PCN reaction occurring within the last 10 years: No If all of the above answers are "NO", then may proceed with Cephalosporin use.      Medication List       Accurate as of December 29, 2019  9:08 PM. If you have any questions, ask your nurse or doctor.        acetaminophen 325 MG tablet Commonly known as: TYLENOL Take 1-2 tablets (325-650 mg total) by mouth every 4 (four) hours as needed for mild pain. What changed:   how much to take  when to take this   allopurinol 100 MG tablet Commonly known as: ZYLOPRIM Take 100 mg by mouth daily.   camphor-menthol lotion Commonly known as: SARNA Apply 1 application topically 4 (four) times daily as needed for itching.   Carboxymethylcellulose Sod PF 0.25 % Soln Place 1 drop into both eyes 4 (four) times daily as needed (dry/irritated eyes.).   cetirizine 10 MG chewable tablet Commonly known as: ZYRTEC Chew 10 mg by mouth daily as needed (allergies.).   clobetasol 0.05 % external solution Commonly known as: TEMOVATE Apply 1 application topically 2  (two) times daily as needed (scalp irritation.).   denosumab 60 MG/ML Sosy injection Commonly known as: PROLIA Inject 60 mg into the skin every 6 (six) months.   diclofenac Sodium 1 % Gel Commonly known as: VOLTAREN   donepezil 10 MG tablet Commonly known as: ARICEPT Take 10 mg by mouth at bedtime.   feeding supplement (NEPRO CARB STEADY) Liqd Take 237 mLs  by mouth in the morning and at bedtime.   Fluocinolone Acetonide Scalp 0.01 % Oil Apply 1 application topically daily as needed (scalp irritation.).   fluticasone 0.05 % cream Commonly known as: CUTIVATE Apply 1 application topically 2 (two) times daily as needed (irritation). Apply to face   fluticasone 50 MCG/ACT nasal spray Commonly known as: FLONASE Place 1 spray into both nostrils daily. What changed:   when to take this  reasons to take this   FreeStyle Freedom Lite w/Device Kit Use to check blood sugar 2 times per day dx code E11.65   freestyle lancets Use as instructed to check blood sugar 2 times per day dx code E11.65   FREESTYLE LITE test strip Generic drug: glucose blood USE THREE TIMES DAILY   hydrocortisone 2.5 % cream Apply 1 application topically 3 (three) times daily as needed (skin irritation (legs & arms)).   insulin aspart 100 UNIT/ML FlexPen Commonly known as: NovoLOG FlexPen INJECT 6-8 UNITS UNDER THE SKIN THREE TIMES DAILY BEFORE MEALS. DX:E11.65 What changed:   how much to take  how to take this  when to take this  additional instructions   ketoconazole 2 % shampoo Commonly known as: Nizoral Apply 1 application topically 2 (two) times a week. What changed:   when to take this  reasons to take this   levothyroxine 50 MCG tablet Commonly known as: SYNTHROID Take 1 tablet (50 mcg total) by mouth daily. What changed: when to take this   lidocaine 5 % Commonly known as: LIDODERM Place 1 patch onto the skin daily.   memantine 5 MG tablet Commonly known as: Namenda Take  1 tablet (5 mg total) by mouth 2 (two) times daily.   midodrine 10 MG tablet Commonly known as: PROAMATINE Take one pill prior to hemodialysis on MWF What changed:   how much to take  how to take this  when to take this  additional instructions   Naftifine HCl 2 % Crea Commonly known as: Naftin Apply to feet daily for fungal infection What changed:   how much to take  how to take this  when to take this  reasons to take this  additional instructions   omeprazole 40 MG capsule Commonly known as: PRILOSEC Take 1 capsule (40 mg total) by mouth daily. What changed: when to take this   Pen Needles 32G X 4 MM Misc 1 each by Does not apply route 3 (three) times daily. USE PEN NEEDLES TO INJECT INSULIN THREE TIMES DAILY.   sevelamer carbonate 800 MG tablet Commonly known as: RENVELA Take 1,200-2,400 mg by mouth See admin instructions. Take 3 tablets (2400 mg) by mouth twice daily with a meal & take 2 tablet (1600 mg) by mouth with a snack   simvastatin 20 MG tablet Commonly known as: ZOCOR Take 20 mg by mouth at bedtime.   Tradjenta 5 MG Tabs tablet Generic drug: linagliptin Take 1 tablet (5 mg total) by mouth daily.   TUMS PO Take 2 tablets by mouth at bedtime.       Allergies:  Allergies  Allergen Reactions  . Ambien [Zolpidem Tartrate] Other (See Comments)    hallucinations and "felt crazy"   . Penicillins Rash    Has patient had a PCN reaction causing immediate rash, facial/tongue/throat swelling, SOB or lightheadedness with hypotension: Yes Has patient had a PCN reaction causing severe rash involving mucus membranes or skin necrosis: Yes Has patient had a PCN reaction that required hospitalization: No Has patient had  a PCN reaction occurring within the last 10 years: No If all of the above answers are "NO", then may proceed with Cephalosporin use.     Past Medical History:  Diagnosis Date  . Allergy   . Anemia   . Arthritis   . Cataract    bil  cateracts removed  . Coronary artery disease   . Dementia arising in the senium and presenium ([place])   . Diabetes mellitus    Type 2  . Diverticulitis   . ED (erectile dysfunction)   . Elevated homocysteine   . ESRD (end stage renal disease) on dialysis ([place]) 03/2015  . GERD (gastroesophageal reflux disease)    pepto   . Gout   . Hiatal hernia   . Hyperlipidemia   . Hypertension   . Hypothyroidism   . Pneumonia   . PVD (peripheral vascular disease) ([place])    has plastic aorta  . Seasonal allergies   . Sleep apnea    does not wear c-pap    Past Surgical History:  Procedure Laterality Date  . aortobifemoral bypass    . AV FISTULA PLACEMENT Left 12/01/2013   Procedure: ARTERIOVENOUS (AV) FISTULA CREATION- LEFT BRACHIOCEPHALIC;  Surgeon: Angelia Mould, MD;  Location: [place];  Service: Vascular;  Laterality: Left;  . [place] TRANSPOSITION Right 07/27/2014   Procedure: BASCILIC VEIN TRANSPOSITION;  Surgeon: Angelia Mould, MD;  Location: [place];  Service: Vascular;  Laterality: Right;  . BRAIN SURGERY  07/22/2018  . BREAST SURGERY     left - granulomatous mastitis  . BURR HOLE Bilateral 07/22/2018   Procedure: BILATERAL BURR HOLES;  Surgeon: Kristeen Miss, MD;  Location: [place];  Service: Neurosurgery;  Laterality: Bilateral;  . COLONOSCOPY    . 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 ENTEROSCOPY N/A 06/03/2018   Procedure: ENTEROSCOPY;  Surgeon: Lavena Bullion, DO;  Location: [place];  Service: Gastroenterology;  Laterality: N/A;  . ENTEROSCOPY N/A 11/20/2018   Procedure: ENTEROSCOPY;  Surgeon: Rush Landmark Telford Nab., MD;  Location: [facility] ENDOSCOPY;  Service: Gastroenterology;  Laterality: N/A;  . ENTEROSCOPY N/A 12/03/2019   Procedure: ENTEROSCOPY;  Surgeon: Lavena Bullion, DO;  Location: [facility] ENDOSCOPY;  Service: Gastroenterology;  Laterality: N/A;  push enteroscopy  . EYE SURGERY Bilateral    cataracts  . HEMODIALYSIS INPATIENT  01/17/2018       . HOT HEMOSTASIS N/A 06/03/2018   Procedure: HOT HEMOSTASIS (ARGON PLASMA COAGULATION/BICAP);  Surgeon: Lavena Bullion, DO;  Location[facility] ENDOSCOPY;  Service: Gastroenterology;  Laterality: N/A;  . HOT HEMOSTASIS N/A 11/20/2018   Procedure: HOT HEMOSTASIS (ARGON PLASMA COAGULATION/BICAP);  Surgeon: Irving Copas., MD;  Location: [place];  Service: Gastroenterology;  Laterality: N/A;  . HOT HEMOSTASIS N/A 12/03/2019   Procedure: HOT HEMOSTASIS (ARGON PLASMA COAGULATION/BICAP);  Surgeon: Lavena Bullion, DO;  Location: [facility] ENDOSCOPY;  Service: Gastroenterology;  Laterality: N/A;  . REVISION OF ARTERIOVENOUS GORETEX GRAFT Right 09/01/2019   Procedure: REVISION OF ARTERIOVENOUS FISTULA RIGHT ARM;  Surgeon: Serafina Mitchell, MD;  Location: [facility] OR;  Service: Vascular;  Laterality: Right;  . REVISON OF ARTERIOVENOUS FISTULA Left 02/09/2014   Procedure: REVISON OF LEFT ARTERIOVENOUS FISTULA - RESECTION OF RENDUNDANT VEIN;  Surgeon: Angelia Mould, MD;  Location: [place];  Service: Vascular;  Laterality: Left;  . SBO with lysis adhesions    . SHUNTOGRAM Left 04/19/2014   Procedure: FISTULOGRAM;  Surgeon: Angelia Mould, MD;  Location: [facility] CATH LAB;  Service: Cardiovascular;  Laterality: Left;  . UNILATERAL UPPER EXTREMEITY ANGIOGRAM N/A 07/12/2014   Procedure: UNILATERAL UPPER Anselmo Rod;  Surgeon: Angelia Mould, MD;  Location: [facility] CATH LAB;  Service: Cardiovascular;  Laterality: N/A;    Family History  Problem Relation Age of Onset  . Aneurysm Mother        brain  . Heart disease Father   . Stroke Father   . Hypertension Father   . Diabetes Father   . Dementia Neg Hx   . Colon cancer Neg Hx   . Esophageal cancer Neg Hx   . Pancreatic cancer Neg Hx   . Prostate cancer Neg Hx   . Rectal cancer Neg Hx   . Stomach cancer Neg Hx     Social History:  reports that he quit smoking about 25 years ago. He has never used smokeless tobacco. He reports that  he does not drink alcohol or use drugs.    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 he was not given Forteo  He has been on Prolia since 5/19 and last injection was in 12/20  Calcium has been variable, also followed by nephrologist, is on calcium supplements  PTH values not available recently  Lab Results  Component Value Date   PTH 154 (H) 10/06/2014   CALCIUM 8.6 11/17/2019   CAION 1.17 12/03/2019   PHOS 4.0 06/16/2019     Lipid history: He has been treated with Simvastatin 20 mg  by PCP, results as follows    Lab Results  Component Value Date   CHOL 104 04/02/2019   HDL 40 04/02/2019   LDLCALC 44 04/02/2019   TRIG 113 04/02/2019   CHOLHDL 2.6 04/02/2019          He has ESRD on dialysis    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  Is on 50 mcg levothyroxine with normal TSH levels, this has been prescribed by PCP  Lab Results  Component Value Date   TSH 1.95 09/15/2019      Physical Examination:  BP 130/60 (BP Location: Left Arm, Patient Position: Sitting, Cuff Size: Normal)   Pulse 67   Ht _0  (1.803 m)   Wt 171 lb 6.4 oz (77.7 kg)   SpO2 98%   BMI 23.91 kg/m   Diabetic Foot Exam - Simple   Simple Foot Form Diabetic Foot exam was performed with the following findings: Yes   Visual Inspection No deformities, no ulcerations, no other skin breakdown bilaterally: Yes Sensation Testing Intact to touch and monofilament testing bilaterally: Yes Pulse Check Posterior Tibialis and Dorsalis pulse intact bilaterally: Yes Comments        ASSESSMENT:  Diabetes type 2  See history of present illness for discussion of his current management,  blood sugar patterns and problems identified  He is being treated with mealtime NovoLog and Tradjenta  A1c is falsely low compared to his home readings because of his renal failure and anemia Fructosamine is improved and overall blood sugars appear to be better than on his last visit Day-to-day management was discussed His wife is still not understanding when to check his blood sugars, need to control postprandial  readings and cover all meals with mealtime insulin as also blood sugar targets  Most of his hyperglycemia is on the afternoons of dialysis because of his snacking continuously with carbohydrates without any insulin coverage Also not clear if he may have high readings after his lunch on other days when he does not take any mealtime coverage Does have a couple of bedtime readings indicating his 6 units of NovoLog at dinnertime is likely adequate As before fasting readings are fairly good with Tradjenta alone and no basal insulin    OSTEOPOROSIS with history of fracture: Doing well with Prolia Needs continued Prolia and will repeat an 6/21  Management of calcium, vitamin D and PTH to be done by nephrologist  Foot exam shows no neuropathy   PLAN:   Given detailed instructions on checking blood sugars and insulin instructions and his wife has made notes about the changes made today He will likely need 4 units to cover his lunch sandwich on the nondialysis days, to start checking blood sugars at least once a day by rotation at different times about 2 hours after eating Does not need to check premeal reading in the morning To take insulin before each meal especially breakfast regardless of Premeal blood sugar but if fasting readings is below 100 may reduce the dose by 1 unit He will still need to take 4 units of NovoLog to cover his evening meal on dialysis days  May need to adjust doses at breakfast and lunch based on postprandial readings Continue Tradjenta Avoid high-fat meals  in the morning and high glycemic index snacks at dialysis Follow-up in 3 months and repeat fructosamine in order to better establish his level of control instead of A1c    Patient Instructions  Check blood sugars on waking up 3 days a week  Also check blood sugars about 2 hours after meals and do this after different meals by rotation  Recommended blood sugar levels on waking up are 90-130 and about 2 hours after meal is 130-160  Please bring your blood sugar monitor to each visit, thank you  Take at least 4 units at supper       Elayne Snare 12/29/2019, 9:08 PM   Note: This office note was prepared with Dragon voice recognition system technology. Any transcriptional errors that result from this process are unintentional.

## 2019-12-29 NOTE — Patient Instructions (Addendum)
Check blood sugars on waking up 3 days a week  Also check blood sugars about 2 hours after meals and do this after different meals by rotation  Recommended blood sugar levels on waking up are 90-130 and about 2 hours after meal is 130-160  Please bring your blood sugar monitor to each visit, thank you  Take at least 4 units at supper

## 2019-12-30 DIAGNOSIS — N2581 Secondary hyperparathyroidism of renal origin: Secondary | ICD-10-CM | POA: Diagnosis not present

## 2019-12-30 DIAGNOSIS — N186 End stage renal disease: Secondary | ICD-10-CM | POA: Diagnosis not present

## 2019-12-30 DIAGNOSIS — Z992 Dependence on renal dialysis: Secondary | ICD-10-CM | POA: Diagnosis not present

## 2019-12-30 DIAGNOSIS — D509 Iron deficiency anemia, unspecified: Secondary | ICD-10-CM | POA: Diagnosis not present

## 2019-12-30 DIAGNOSIS — E119 Type 2 diabetes mellitus without complications: Secondary | ICD-10-CM | POA: Diagnosis not present

## 2020-01-01 DIAGNOSIS — Z992 Dependence on renal dialysis: Secondary | ICD-10-CM | POA: Diagnosis not present

## 2020-01-01 DIAGNOSIS — E119 Type 2 diabetes mellitus without complications: Secondary | ICD-10-CM | POA: Diagnosis not present

## 2020-01-01 DIAGNOSIS — N186 End stage renal disease: Secondary | ICD-10-CM | POA: Diagnosis not present

## 2020-01-01 DIAGNOSIS — N2581 Secondary hyperparathyroidism of renal origin: Secondary | ICD-10-CM | POA: Diagnosis not present

## 2020-01-01 DIAGNOSIS — D509 Iron deficiency anemia, unspecified: Secondary | ICD-10-CM | POA: Diagnosis not present

## 2020-01-04 DIAGNOSIS — N2581 Secondary hyperparathyroidism of renal origin: Secondary | ICD-10-CM | POA: Diagnosis not present

## 2020-01-04 DIAGNOSIS — E119 Type 2 diabetes mellitus without complications: Secondary | ICD-10-CM | POA: Diagnosis not present

## 2020-01-04 DIAGNOSIS — D509 Iron deficiency anemia, unspecified: Secondary | ICD-10-CM | POA: Diagnosis not present

## 2020-01-04 DIAGNOSIS — Z992 Dependence on renal dialysis: Secondary | ICD-10-CM | POA: Diagnosis not present

## 2020-01-04 DIAGNOSIS — N186 End stage renal disease: Secondary | ICD-10-CM | POA: Diagnosis not present

## 2020-01-05 ENCOUNTER — Ambulatory Visit (INDEPENDENT_AMBULATORY_CARE_PROVIDER_SITE_OTHER): Payer: Medicare Other | Admitting: Gastroenterology

## 2020-01-05 ENCOUNTER — Encounter: Payer: Self-pay | Admitting: Gastroenterology

## 2020-01-05 ENCOUNTER — Other Ambulatory Visit: Payer: Self-pay

## 2020-01-05 VITALS — BP 112/62 | HR 66 | Temp 97.1°F | Ht 71.0 in | Wt 173.0 lb

## 2020-01-05 DIAGNOSIS — R159 Full incontinence of feces: Secondary | ICD-10-CM

## 2020-01-05 DIAGNOSIS — R197 Diarrhea, unspecified: Secondary | ICD-10-CM | POA: Diagnosis not present

## 2020-01-05 DIAGNOSIS — K648 Other hemorrhoids: Secondary | ICD-10-CM | POA: Diagnosis not present

## 2020-01-05 DIAGNOSIS — K5520 Angiodysplasia of colon without hemorrhage: Secondary | ICD-10-CM

## 2020-01-05 NOTE — Progress Notes (Signed)
P  Chief Complaint:    Diarrhea, small bowel AVMs.  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  CT unrevealing. Small bowel enteroscopy completed 11/20/2018 and n/fbleeding in gastric cardia (APC), AVM in gastric fundus (APC), gastritis, oozing x2 in the duodenal bulb (APC), 3 AVMs in proximal jejunum (APC). Discharge hemoglobin 8.6 and repeat in 11/2018 was 11.1 and 10.6 in late March.  IV iron infusion with HD in 12/2018.  Developed melena in 10/2019 with H/H 10.6/33 (stable).  CT abdomen/pelvis unremarkable for acute issues.  Endoscopic history: Small bowel enteroscopy 12/03/2019 by Dr. Bryan Lemma: -2 cm HH -Nonobstructing Schatzki's ring -Normal stomach -2 AVMs in D3/D4, 3 AVMs and proximal jejunum, all treated with APC -Tattoo seen jejunum from prior enteroscopy  Small bowel capsule endoscopy 03/17/2019:   1.  Complete study, adequate prep. 2.  No active bleeding/oozing. 3.  Few tiny red spots/possible tiny AVMs in the mid small bowel around 2 hours 30 minutes. 4.  Distal small bowel AVMs at 7 hours 13 minutes range.  Small bowel enteroscopy 11/20/2018 by Dr. Rush Landmark: - No gross lesions in esophagus. - A single spot with bleeding in the stomach. Treated with argon plasma coagulation (APC). - A single non-bleeding angioectasia in the stomach. Treated with argon plasma coagulation (APC). - Erythematous mucosa in the greater curvature. - No other gross lesions in the stomach. - Two spots with oozing in the duodenum. Treated with argon plasma coagulation (APC). - Normal mucosa was found in the entire examined duodenum otherwise. - Three non-bleeding angioectasias in the jejunum.  Treated with argon plasma coagulation (APC). - Normal mucosa was found in the jejunum otherwise. Tattooed.  Small bowel enteroscopy 06/03/2018 done due to UGI bleed/melena/anemia by Dr. Bryan Lemma: - The examined portion of the jejunum was normal. - Normal duodenal bulb, first portion of the duodenum, second portion of the duodenum, third portion of the duodenum and fourth portion of the duodenum. - Normal esophagus. - A single recently bleeding angioectasia in the stomach. Treated with argon plasma coagulation (APC). - Normal gastric body, antrum and pylorus. - No specimens collected.  EGD 11/12/2017 done due to IDA by Dr. Silverio Decamp. - Benign-appearing esophageal stenosis. - Erythematous mucosa in the stomach. No H. Pylori.  - Normal examined duodenum.  Colonoscopy 11/12/2017 done due to IDA by Dr. Silverio Decamp: - The perianal and digital rectal examinations were normal. - A 9 mm tubulovillous polyp was found in the transverse colon. The polyp was sessile. The polyp was removed with a cold snare. Resection and retrieval were complete. - A 1 mm hyperplastic polyp was found in the recto-sigmoid colon. The polyp was sessile. The polyp was removed with a cold biopsy forceps. Resection and retrieval were complete. - A single localized non-bleeding erosion was found in the transverse colon. No stigmata of recent bleeding were seen. - A few small and large-mouthed diverticula were found in the sigmoid colon, descending colon, transverse colon, hepatic flexure and ascending colon. - Non-bleeding internal hemorrhoids were found during retroflexion. The hemorrhoids were small.  HPI:     Patient is a 79 y.o. male presenting [facility] for [facility]-up.  Was last seen by Carl Best on 11/17/2019 with intermittent melena at that time.  H/H was stable, but was referred for small bowel enteroscopy  for therapeutic intent due to known AVMs.  Treated a total of 5 small bowel  AVMs as outlined above.   Additionally, he was c/o diarrhea x1 week at that appointment.  Diagnosed with C. difficile by GI PCR panel and treated with vancomycin p.o. and Saccharomyces.  Today, his main issue is ongoing diarrhea.  Sxs much improved since completing PO Vanco. Stools more formed in the last few days but still increased frequency. Additionally, has had episodes of fecal seepage, particularly at nighttime and with HD.  Small volume, and o/w without sensation to have BM with those episodes. Completed vancomycin as prescribed. Completed course of probiotics. Has known hx of internal hemorrhoids. No GI bleeding.   No new CBC for review.  No new abdominal imaging for review.     Review of systems:     No chest pain, no SOB, no fevers, no urinary sx   Past Medical History:  Diagnosis Date  . Allergy   . Anemia   . Arthritis   . Cataract    bil cateracts removed  . Coronary artery disease   . Dementia arising in the senium and presenium ([place])   . Diabetes mellitus    Type 2  . Diverticulitis   . ED (erectile dysfunction)   . Elevated homocysteine   . ESRD (end stage renal disease) on dialysis ([place]) 03/2015  . GERD (gastroesophageal reflux disease)    pepto   . Gout   . Hiatal hernia   . Hyperlipidemia   . Hypertension   . Hypothyroidism   . Pneumonia   . PVD (peripheral vascular disease) ([place])    has plastic aorta  . Seasonal allergies   . Sleep apnea    does not wear c-pap    Patient's surgical history, family medical history, social history, medications and allergies were all reviewed in Epic    Current Outpatient Medications  Medication Sig Dispense Refill  . acetaminophen (TYLENOL) 325 MG tablet Take 1-2 tablets (325-650 mg total) by mouth every 4 (four) hours as needed for mild pain. (Patient taking differently: Take 650 mg by mouth in the morning and at bedtime. )    . allopurinol (ZYLOPRIM) 100 MG tablet Take 100 mg by mouth daily.     . Blood Glucose  Monitoring Suppl (FREESTYLE FREEDOM LITE) w/Device KIT Use to check blood sugar 2 times per day dx code E11.65 1 each 0  . Calcium Carbonate Antacid (TUMS PO) Take 2 tablets by mouth at bedtime.     . camphor-menthol (SARNA) lotion Apply 1 application topically 4 (four) times daily as needed for itching.     . Carboxymethylcellulose Sod PF 0.25 % SOLN Place 1 drop into both eyes 4 (four) times daily as needed (dry/irritated eyes.).     Marland Kitchen cetirizine (ZYRTEC) 10 MG chewable tablet Chew 10 mg by mouth daily as needed (allergies.).     Marland Kitchen clobetasol (TEMOVATE) 0.05 % external solution Apply 1 application topically 2 (two) times daily as needed (scalp irritation.).   0  . denosumab (PROLIA) 60 MG/ML SOSY injection Inject 60 mg into the skin every 6 (six) months.    . diclofenac Sodium (VOLTAREN) 1 % GEL     . donepezil (ARICEPT) 10 MG tablet Take 10 mg by mouth at bedtime.     Marland Kitchen FLUOCINOLONE ACETONIDE SCALP 0.01 % OIL Apply 1 application topically daily as needed (scalp irritation.).   0  . fluticasone (CUTIVATE) 0.05 % cream Apply 1 application topically 2 (two)  times daily as needed (irritation). Apply to face  0  . fluticasone (FLONASE) 50 MCG/ACT nasal spray Place 1 spray into both nostrils daily. (Patient taking differently: Place 1 spray into both nostrils daily as needed for allergies. ) 16 g 2  . glucose blood (FREESTYLE LITE) test strip USE THREE TIMES DAILY 200 strip 2  . hydrocortisone 2.5 % cream Apply 1 application topically 3 (three) times daily as needed (skin irritation (legs & arms)).     Marland Kitchen insulin aspart (NOVOLOG FLEXPEN) 100 UNIT/ML FlexPen INJECT 6-8 UNITS UNDER THE SKIN THREE TIMES DAILY BEFORE MEALS. DX:E11.65 (Patient taking differently: Inject 6-8 Units into the skin 3 (three) times daily with meals. Sliding scale insulin) 15 mL 12  . Insulin Pen Needle (PEN NEEDLES) 32G X 4 MM MISC 1 each by Does not apply route 3 (three) times daily. USE PEN NEEDLES TO INJECT INSULIN THREE TIMES  DAILY. 100 each 12  . ketoconazole (NIZORAL) 2 % shampoo Apply 1 application topically 2 (two) times a week. (Patient taking differently: Apply 1 application topically daily as needed for irritation. ) 120 mL 0  . Lancets (FREESTYLE) lancets Use as instructed to check blood sugar 2 times per day dx code E11.65 100 each 3  . levothyroxine (SYNTHROID, LEVOTHROID) 50 MCG tablet Take 1 tablet (50 mcg total) by mouth daily. (Patient taking differently: Take 50 mcg by mouth daily before breakfast. ) 90 tablet 1  . lidocaine (LIDODERM) 5 % Place 1 patch onto the skin daily.     . memantine (NAMENDA) 5 MG tablet Take 1 tablet (5 mg total) by mouth 2 (two) times daily. 60 tablet 6  . midodrine (PROAMATINE) 10 MG tablet Take one pill prior to hemodialysis on MWF (Patient taking differently: Take 10 mg by mouth every Monday, Wednesday, and Friday with hemodialysis. )    . Naftifine HCl (NAFTIN) 2 % CREA Apply to feet daily for fungal infection (Patient taking differently: Apply 1 application topically daily as needed (for fungal infection). Apply to feet) 60 g prn  . Nutritional Supplements (FEEDING SUPPLEMENT, NEPRO CARB STEADY,) LIQD Take 237 mLs by mouth in the morning and at bedtime.     Marland Kitchen omeprazole (PRILOSEC) 40 MG capsule Take 1 capsule (40 mg total) by mouth daily. (Patient taking differently: Take 40 mg by mouth daily before breakfast. ) 30 capsule 0  . sevelamer carbonate (RENVELA) 800 MG tablet Take 1,200-2,400 mg by mouth See admin instructions. Take 3 tablets (2400 mg) by mouth twice daily with a meal & take 2 tablet (1600 mg) by mouth with a snack    . simvastatin (ZOCOR) 20 MG tablet Take 20 mg by mouth at bedtime.     . TRADJENTA 5 MG TABS tablet Take 1 tablet (5 mg total) by mouth daily. 90 tablet 2   No current facility-administered medications for this visit.    Physical Exam:     There were no vitals taken for this visit.  GENERAL:  Pleasant male in NAD PSYCH: : Cooperative, normal  affect NEURO: Alert and oriented x 3, no focal neurologic deficits   IMPRESSION and PLAN:    1) Change in bowel habits 2) Fecal seepage 3) Recent C diff infection now s/p PO Vancomycin 4) Internal hemorrhoids  Recent C diff infection, treated with PO Vanco and probiotic. Overall improvement, but still with increased stool frequency, but more form. Now with intermittent fecal seepage, which is new since infection onset.   Suspect seepage related to recent  infection and hemorrhoids, possibly exacerbated by the recent diarrhea, and plan as follows:  - Start fiber supplement to bulk stool - Restart probiotic for an additional 3 weeks - If no appreiable change, plan for rectal and anoscopic exam to assess tone and hemorrhoid banding as appropriate and/or ARM  5) Hx of small bowel AVMs.  - Hx of AVMs with previous APC. No recurrence of dark stools - Survey with serial H/H checks with additional enteroscopy as needed  I spent 25 minutes of time, including independent review of results as outlined above, communicating results with the patient directly, face-to-face time with the patient, coordinating care, ordering studies and medications as appropriate, and documentation.             Mila Doce ,DO, FACG 01/05/2020, 1:28 PM

## 2020-01-05 NOTE — Patient Instructions (Signed)
Start [employer]  Daily   Start a Probiotic 3 times a week  Return in 6 months  It was a pleasure to see you today!  Vito Cirigliano, D.O.

## 2020-01-06 DIAGNOSIS — D509 Iron deficiency anemia, unspecified: Secondary | ICD-10-CM | POA: Diagnosis not present

## 2020-01-06 DIAGNOSIS — E119 Type 2 diabetes mellitus without complications: Secondary | ICD-10-CM | POA: Diagnosis not present

## 2020-01-06 DIAGNOSIS — Z992 Dependence on renal dialysis: Secondary | ICD-10-CM | POA: Diagnosis not present

## 2020-01-06 DIAGNOSIS — N186 End stage renal disease: Secondary | ICD-10-CM | POA: Diagnosis not present

## 2020-01-06 DIAGNOSIS — N2581 Secondary hyperparathyroidism of renal origin: Secondary | ICD-10-CM | POA: Diagnosis not present

## 2020-01-08 DIAGNOSIS — D509 Iron deficiency anemia, unspecified: Secondary | ICD-10-CM | POA: Diagnosis not present

## 2020-01-08 DIAGNOSIS — N2581 Secondary hyperparathyroidism of renal origin: Secondary | ICD-10-CM | POA: Diagnosis not present

## 2020-01-08 DIAGNOSIS — Z992 Dependence on renal dialysis: Secondary | ICD-10-CM | POA: Diagnosis not present

## 2020-01-08 DIAGNOSIS — E119 Type 2 diabetes mellitus without complications: Secondary | ICD-10-CM | POA: Diagnosis not present

## 2020-01-08 DIAGNOSIS — N186 End stage renal disease: Secondary | ICD-10-CM | POA: Diagnosis not present

## 2020-01-11 DIAGNOSIS — D509 Iron deficiency anemia, unspecified: Secondary | ICD-10-CM | POA: Diagnosis not present

## 2020-01-11 DIAGNOSIS — N186 End stage renal disease: Secondary | ICD-10-CM | POA: Diagnosis not present

## 2020-01-11 DIAGNOSIS — N2581 Secondary hyperparathyroidism of renal origin: Secondary | ICD-10-CM | POA: Diagnosis not present

## 2020-01-11 DIAGNOSIS — E119 Type 2 diabetes mellitus without complications: Secondary | ICD-10-CM | POA: Diagnosis not present

## 2020-01-11 DIAGNOSIS — Z992 Dependence on renal dialysis: Secondary | ICD-10-CM | POA: Diagnosis not present

## 2020-01-13 DIAGNOSIS — Z992 Dependence on renal dialysis: Secondary | ICD-10-CM | POA: Diagnosis not present

## 2020-01-13 DIAGNOSIS — N186 End stage renal disease: Secondary | ICD-10-CM | POA: Diagnosis not present

## 2020-01-13 DIAGNOSIS — N2581 Secondary hyperparathyroidism of renal origin: Secondary | ICD-10-CM | POA: Diagnosis not present

## 2020-01-13 DIAGNOSIS — D509 Iron deficiency anemia, unspecified: Secondary | ICD-10-CM | POA: Diagnosis not present

## 2020-01-13 DIAGNOSIS — E1129 Type 2 diabetes mellitus with other diabetic kidney complication: Secondary | ICD-10-CM | POA: Diagnosis not present

## 2020-01-13 DIAGNOSIS — E119 Type 2 diabetes mellitus without complications: Secondary | ICD-10-CM | POA: Diagnosis not present

## 2020-01-15 DIAGNOSIS — N186 End stage renal disease: Secondary | ICD-10-CM | POA: Diagnosis not present

## 2020-01-15 DIAGNOSIS — D509 Iron deficiency anemia, unspecified: Secondary | ICD-10-CM | POA: Diagnosis not present

## 2020-01-15 DIAGNOSIS — Z992 Dependence on renal dialysis: Secondary | ICD-10-CM | POA: Diagnosis not present

## 2020-01-15 DIAGNOSIS — N2581 Secondary hyperparathyroidism of renal origin: Secondary | ICD-10-CM | POA: Diagnosis not present

## 2020-01-15 DIAGNOSIS — E119 Type 2 diabetes mellitus without complications: Secondary | ICD-10-CM | POA: Diagnosis not present

## 2020-01-18 DIAGNOSIS — E119 Type 2 diabetes mellitus without complications: Secondary | ICD-10-CM | POA: Diagnosis not present

## 2020-01-18 DIAGNOSIS — N2581 Secondary hyperparathyroidism of renal origin: Secondary | ICD-10-CM | POA: Diagnosis not present

## 2020-01-18 DIAGNOSIS — Z992 Dependence on renal dialysis: Secondary | ICD-10-CM | POA: Diagnosis not present

## 2020-01-18 DIAGNOSIS — D509 Iron deficiency anemia, unspecified: Secondary | ICD-10-CM | POA: Diagnosis not present

## 2020-01-18 DIAGNOSIS — N186 End stage renal disease: Secondary | ICD-10-CM | POA: Diagnosis not present

## 2020-01-20 DIAGNOSIS — D509 Iron deficiency anemia, unspecified: Secondary | ICD-10-CM | POA: Diagnosis not present

## 2020-01-20 DIAGNOSIS — N2581 Secondary hyperparathyroidism of renal origin: Secondary | ICD-10-CM | POA: Diagnosis not present

## 2020-01-20 DIAGNOSIS — E119 Type 2 diabetes mellitus without complications: Secondary | ICD-10-CM | POA: Diagnosis not present

## 2020-01-20 DIAGNOSIS — N186 End stage renal disease: Secondary | ICD-10-CM | POA: Diagnosis not present

## 2020-01-20 DIAGNOSIS — Z992 Dependence on renal dialysis: Secondary | ICD-10-CM | POA: Diagnosis not present

## 2020-01-22 DIAGNOSIS — E119 Type 2 diabetes mellitus without complications: Secondary | ICD-10-CM | POA: Diagnosis not present

## 2020-01-22 DIAGNOSIS — Z992 Dependence on renal dialysis: Secondary | ICD-10-CM | POA: Diagnosis not present

## 2020-01-22 DIAGNOSIS — D509 Iron deficiency anemia, unspecified: Secondary | ICD-10-CM | POA: Diagnosis not present

## 2020-01-22 DIAGNOSIS — N186 End stage renal disease: Secondary | ICD-10-CM | POA: Diagnosis not present

## 2020-01-22 DIAGNOSIS — N2581 Secondary hyperparathyroidism of renal origin: Secondary | ICD-10-CM | POA: Diagnosis not present

## 2020-01-23 DIAGNOSIS — N186 End stage renal disease: Secondary | ICD-10-CM | POA: Diagnosis not present

## 2020-01-23 DIAGNOSIS — Z992 Dependence on renal dialysis: Secondary | ICD-10-CM | POA: Diagnosis not present

## 2020-01-23 DIAGNOSIS — E1122 Type 2 diabetes mellitus with diabetic chronic kidney disease: Secondary | ICD-10-CM | POA: Diagnosis not present

## 2020-01-25 ENCOUNTER — Other Ambulatory Visit: Payer: Self-pay

## 2020-01-25 DIAGNOSIS — N2581 Secondary hyperparathyroidism of renal origin: Secondary | ICD-10-CM | POA: Diagnosis not present

## 2020-01-25 DIAGNOSIS — D509 Iron deficiency anemia, unspecified: Secondary | ICD-10-CM | POA: Diagnosis not present

## 2020-01-25 DIAGNOSIS — E1129 Type 2 diabetes mellitus with other diabetic kidney complication: Secondary | ICD-10-CM | POA: Diagnosis not present

## 2020-01-25 DIAGNOSIS — N186 End stage renal disease: Secondary | ICD-10-CM | POA: Diagnosis not present

## 2020-01-25 DIAGNOSIS — Z992 Dependence on renal dialysis: Secondary | ICD-10-CM | POA: Diagnosis not present

## 2020-01-25 DIAGNOSIS — E119 Type 2 diabetes mellitus without complications: Secondary | ICD-10-CM | POA: Diagnosis not present

## 2020-01-25 MED ORDER — FREESTYLE LITE TEST VI STRP: ORAL_STRIP | 2 refills | Status: DC

## 2020-01-27 DIAGNOSIS — N186 End stage renal disease: Secondary | ICD-10-CM | POA: Diagnosis not present

## 2020-01-27 DIAGNOSIS — Z992 Dependence on renal dialysis: Secondary | ICD-10-CM | POA: Diagnosis not present

## 2020-01-27 DIAGNOSIS — E119 Type 2 diabetes mellitus without complications: Secondary | ICD-10-CM | POA: Diagnosis not present

## 2020-01-27 DIAGNOSIS — N2581 Secondary hyperparathyroidism of renal origin: Secondary | ICD-10-CM | POA: Diagnosis not present

## 2020-01-27 DIAGNOSIS — D509 Iron deficiency anemia, unspecified: Secondary | ICD-10-CM | POA: Diagnosis not present

## 2020-01-29 DIAGNOSIS — Z992 Dependence on renal dialysis: Secondary | ICD-10-CM | POA: Diagnosis not present

## 2020-01-29 DIAGNOSIS — N2581 Secondary hyperparathyroidism of renal origin: Secondary | ICD-10-CM | POA: Diagnosis not present

## 2020-01-29 DIAGNOSIS — E119 Type 2 diabetes mellitus without complications: Secondary | ICD-10-CM | POA: Diagnosis not present

## 2020-01-29 DIAGNOSIS — D509 Iron deficiency anemia, unspecified: Secondary | ICD-10-CM | POA: Diagnosis not present

## 2020-01-29 DIAGNOSIS — N186 End stage renal disease: Secondary | ICD-10-CM | POA: Diagnosis not present

## 2020-02-01 DIAGNOSIS — N186 End stage renal disease: Secondary | ICD-10-CM | POA: Diagnosis not present

## 2020-02-01 DIAGNOSIS — E119 Type 2 diabetes mellitus without complications: Secondary | ICD-10-CM | POA: Diagnosis not present

## 2020-02-01 DIAGNOSIS — Z992 Dependence on renal dialysis: Secondary | ICD-10-CM | POA: Diagnosis not present

## 2020-02-01 DIAGNOSIS — D509 Iron deficiency anemia, unspecified: Secondary | ICD-10-CM | POA: Diagnosis not present

## 2020-02-01 DIAGNOSIS — N2581 Secondary hyperparathyroidism of renal origin: Secondary | ICD-10-CM | POA: Diagnosis not present

## 2020-02-03 DIAGNOSIS — Z992 Dependence on renal dialysis: Secondary | ICD-10-CM | POA: Diagnosis not present

## 2020-02-03 DIAGNOSIS — D509 Iron deficiency anemia, unspecified: Secondary | ICD-10-CM | POA: Diagnosis not present

## 2020-02-03 DIAGNOSIS — E119 Type 2 diabetes mellitus without complications: Secondary | ICD-10-CM | POA: Diagnosis not present

## 2020-02-03 DIAGNOSIS — N2581 Secondary hyperparathyroidism of renal origin: Secondary | ICD-10-CM | POA: Diagnosis not present

## 2020-02-03 DIAGNOSIS — N186 End stage renal disease: Secondary | ICD-10-CM | POA: Diagnosis not present

## 2020-02-05 DIAGNOSIS — D509 Iron deficiency anemia, unspecified: Secondary | ICD-10-CM | POA: Diagnosis not present

## 2020-02-05 DIAGNOSIS — Z992 Dependence on renal dialysis: Secondary | ICD-10-CM | POA: Diagnosis not present

## 2020-02-05 DIAGNOSIS — N186 End stage renal disease: Secondary | ICD-10-CM | POA: Diagnosis not present

## 2020-02-05 DIAGNOSIS — N2581 Secondary hyperparathyroidism of renal origin: Secondary | ICD-10-CM | POA: Diagnosis not present

## 2020-02-05 DIAGNOSIS — E119 Type 2 diabetes mellitus without complications: Secondary | ICD-10-CM | POA: Diagnosis not present

## 2020-02-08 DIAGNOSIS — N186 End stage renal disease: Secondary | ICD-10-CM | POA: Diagnosis not present

## 2020-02-08 DIAGNOSIS — N2581 Secondary hyperparathyroidism of renal origin: Secondary | ICD-10-CM | POA: Diagnosis not present

## 2020-02-08 DIAGNOSIS — E119 Type 2 diabetes mellitus without complications: Secondary | ICD-10-CM | POA: Diagnosis not present

## 2020-02-08 DIAGNOSIS — D509 Iron deficiency anemia, unspecified: Secondary | ICD-10-CM | POA: Diagnosis not present

## 2020-02-08 DIAGNOSIS — Z992 Dependence on renal dialysis: Secondary | ICD-10-CM | POA: Diagnosis not present

## 2020-02-10 DIAGNOSIS — N2581 Secondary hyperparathyroidism of renal origin: Secondary | ICD-10-CM | POA: Diagnosis not present

## 2020-02-10 DIAGNOSIS — D509 Iron deficiency anemia, unspecified: Secondary | ICD-10-CM | POA: Diagnosis not present

## 2020-02-10 DIAGNOSIS — E119 Type 2 diabetes mellitus without complications: Secondary | ICD-10-CM | POA: Diagnosis not present

## 2020-02-10 DIAGNOSIS — N186 End stage renal disease: Secondary | ICD-10-CM | POA: Diagnosis not present

## 2020-02-10 DIAGNOSIS — Z992 Dependence on renal dialysis: Secondary | ICD-10-CM | POA: Diagnosis not present

## 2020-02-12 DIAGNOSIS — Z992 Dependence on renal dialysis: Secondary | ICD-10-CM | POA: Diagnosis not present

## 2020-02-12 DIAGNOSIS — N2581 Secondary hyperparathyroidism of renal origin: Secondary | ICD-10-CM | POA: Diagnosis not present

## 2020-02-12 DIAGNOSIS — N186 End stage renal disease: Secondary | ICD-10-CM | POA: Diagnosis not present

## 2020-02-12 DIAGNOSIS — D509 Iron deficiency anemia, unspecified: Secondary | ICD-10-CM | POA: Diagnosis not present

## 2020-02-12 DIAGNOSIS — E119 Type 2 diabetes mellitus without complications: Secondary | ICD-10-CM | POA: Diagnosis not present

## 2020-02-15 DIAGNOSIS — N186 End stage renal disease: Secondary | ICD-10-CM | POA: Diagnosis not present

## 2020-02-15 DIAGNOSIS — N2581 Secondary hyperparathyroidism of renal origin: Secondary | ICD-10-CM | POA: Diagnosis not present

## 2020-02-15 DIAGNOSIS — D509 Iron deficiency anemia, unspecified: Secondary | ICD-10-CM | POA: Diagnosis not present

## 2020-02-15 DIAGNOSIS — Z992 Dependence on renal dialysis: Secondary | ICD-10-CM | POA: Diagnosis not present

## 2020-02-15 DIAGNOSIS — E119 Type 2 diabetes mellitus without complications: Secondary | ICD-10-CM | POA: Diagnosis not present

## 2020-02-17 DIAGNOSIS — D509 Iron deficiency anemia, unspecified: Secondary | ICD-10-CM | POA: Diagnosis not present

## 2020-02-17 DIAGNOSIS — N186 End stage renal disease: Secondary | ICD-10-CM | POA: Diagnosis not present

## 2020-02-17 DIAGNOSIS — Z992 Dependence on renal dialysis: Secondary | ICD-10-CM | POA: Diagnosis not present

## 2020-02-17 DIAGNOSIS — E119 Type 2 diabetes mellitus without complications: Secondary | ICD-10-CM | POA: Diagnosis not present

## 2020-02-17 DIAGNOSIS — N2581 Secondary hyperparathyroidism of renal origin: Secondary | ICD-10-CM | POA: Diagnosis not present

## 2020-02-19 DIAGNOSIS — D509 Iron deficiency anemia, unspecified: Secondary | ICD-10-CM | POA: Diagnosis not present

## 2020-02-19 DIAGNOSIS — N2581 Secondary hyperparathyroidism of renal origin: Secondary | ICD-10-CM | POA: Diagnosis not present

## 2020-02-19 DIAGNOSIS — E119 Type 2 diabetes mellitus without complications: Secondary | ICD-10-CM | POA: Diagnosis not present

## 2020-02-19 DIAGNOSIS — N186 End stage renal disease: Secondary | ICD-10-CM | POA: Diagnosis not present

## 2020-02-19 DIAGNOSIS — Z992 Dependence on renal dialysis: Secondary | ICD-10-CM | POA: Diagnosis not present

## 2020-02-22 DIAGNOSIS — Z992 Dependence on renal dialysis: Secondary | ICD-10-CM | POA: Diagnosis not present

## 2020-02-22 DIAGNOSIS — N2581 Secondary hyperparathyroidism of renal origin: Secondary | ICD-10-CM | POA: Diagnosis not present

## 2020-02-22 DIAGNOSIS — N186 End stage renal disease: Secondary | ICD-10-CM | POA: Diagnosis not present

## 2020-02-22 DIAGNOSIS — E119 Type 2 diabetes mellitus without complications: Secondary | ICD-10-CM | POA: Diagnosis not present

## 2020-02-22 DIAGNOSIS — D509 Iron deficiency anemia, unspecified: Secondary | ICD-10-CM | POA: Diagnosis not present

## 2020-03-01 ENCOUNTER — Other Ambulatory Visit: Payer: Self-pay | Admitting: Endocrinology

## 2020-03-07 ENCOUNTER — Telehealth: Payer: Self-pay

## 2020-03-07 NOTE — Telephone Encounter (Signed)
Submitted patient to [employer] Assist portal to verify coverage for Prolia  

## 2020-03-21 NOTE — Telephone Encounter (Signed)
Summary of benefits indicates that this patient's insurance does not require a PA for Prolia. In addition, pt does not owe anything for medication or administration.   Patient can be scheduled any time on/after 03/24/2020 Owes $0.  Called pt and left detailed voicemail with this information, and requested a call back to schedule.

## 2020-03-24 DIAGNOSIS — E1122 Type 2 diabetes mellitus with diabetic chronic kidney disease: Secondary | ICD-10-CM | POA: Diagnosis not present

## 2020-03-24 DIAGNOSIS — Z992 Dependence on renal dialysis: Secondary | ICD-10-CM | POA: Diagnosis not present

## 2020-03-24 DIAGNOSIS — N186 End stage renal disease: Secondary | ICD-10-CM | POA: Diagnosis not present

## 2020-03-25 DIAGNOSIS — N186 End stage renal disease: Secondary | ICD-10-CM | POA: Diagnosis not present

## 2020-03-25 DIAGNOSIS — Z992 Dependence on renal dialysis: Secondary | ICD-10-CM | POA: Diagnosis not present

## 2020-03-25 DIAGNOSIS — E119 Type 2 diabetes mellitus without complications: Secondary | ICD-10-CM | POA: Diagnosis not present

## 2020-03-25 DIAGNOSIS — E1129 Type 2 diabetes mellitus with other diabetic kidney complication: Secondary | ICD-10-CM | POA: Diagnosis not present

## 2020-03-25 DIAGNOSIS — D509 Iron deficiency anemia, unspecified: Secondary | ICD-10-CM | POA: Diagnosis not present

## 2020-03-25 DIAGNOSIS — D631 Anemia in chronic kidney disease: Secondary | ICD-10-CM | POA: Diagnosis not present

## 2020-03-25 DIAGNOSIS — N2581 Secondary hyperparathyroidism of renal origin: Secondary | ICD-10-CM | POA: Diagnosis not present

## 2020-03-25 NOTE — Telephone Encounter (Signed)
Called pt's wife at cell phone number listed to discuss Prolia injection. She did not answer, but I did leave a detailed voicemail informing her that the pt was due for his injection. I did note that the pt has an appt with Dr. Dwyane Dee on 03/31/20, and he could get his injection on that date if he prefers, however, I did request a call back from wife to ensure that this injection is available on the date the pt comes for his f/u visit with the provider.

## 2020-03-28 DIAGNOSIS — Z992 Dependence on renal dialysis: Secondary | ICD-10-CM | POA: Diagnosis not present

## 2020-03-28 DIAGNOSIS — E1129 Type 2 diabetes mellitus with other diabetic kidney complication: Secondary | ICD-10-CM | POA: Diagnosis not present

## 2020-03-28 DIAGNOSIS — N186 End stage renal disease: Secondary | ICD-10-CM | POA: Diagnosis not present

## 2020-03-28 DIAGNOSIS — E119 Type 2 diabetes mellitus without complications: Secondary | ICD-10-CM | POA: Diagnosis not present

## 2020-03-28 DIAGNOSIS — D509 Iron deficiency anemia, unspecified: Secondary | ICD-10-CM | POA: Diagnosis not present

## 2020-03-29 ENCOUNTER — Other Ambulatory Visit (INDEPENDENT_AMBULATORY_CARE_PROVIDER_SITE_OTHER): Payer: Medicare Other

## 2020-03-29 ENCOUNTER — Other Ambulatory Visit: Payer: Self-pay

## 2020-03-29 ENCOUNTER — Other Ambulatory Visit: Payer: Self-pay | Admitting: Endocrinology

## 2020-03-29 ENCOUNTER — Other Ambulatory Visit: Payer: Self-pay | Admitting: Gastroenterology

## 2020-03-29 ENCOUNTER — Telehealth: Payer: Self-pay | Admitting: Gastroenterology

## 2020-03-29 DIAGNOSIS — E039 Hypothyroidism, unspecified: Secondary | ICD-10-CM | POA: Diagnosis not present

## 2020-03-29 DIAGNOSIS — E782 Mixed hyperlipidemia: Secondary | ICD-10-CM

## 2020-03-29 DIAGNOSIS — Z794 Long term (current) use of insulin: Secondary | ICD-10-CM | POA: Diagnosis not present

## 2020-03-29 DIAGNOSIS — K921 Melena: Secondary | ICD-10-CM

## 2020-03-29 DIAGNOSIS — E1165 Type 2 diabetes mellitus with hyperglycemia: Secondary | ICD-10-CM | POA: Diagnosis not present

## 2020-03-29 DIAGNOSIS — K5520 Angiodysplasia of colon without hemorrhage: Secondary | ICD-10-CM

## 2020-03-29 LAB — LIPID PANEL
Cholesterol: 81 mg/dL (ref 0–200)
HDL: 39.5 mg/dL (ref >39.00)
LDL Cholesterol: 22 mg/dL (ref 0–99)
NonHDL: 41.84
Total CHOL/HDL Ratio: 2
Triglycerides: 101 mg/dL (ref 0.0–149.0)
VLDL: 20.2 mg/dL (ref 0.0–40.0)

## 2020-03-29 LAB — TSH: TSH: 1.9 u[IU]/mL (ref 0.35–4.50)

## 2020-03-29 LAB — GLUCOSE, RANDOM: Glucose, Bld: 85 mg/dL (ref 70–99)

## 2020-03-29 LAB — CBC WITH DIFFERENTIAL/PLATELET
Basophils Absolute: 0.1 10*3/uL (ref 0.0–0.1)
Basophils Relative: 0.7 % (ref 0.0–3.0)
Eosinophils Absolute: 0.3 10*3/uL (ref 0.0–0.7)
Eosinophils Relative: 3.9 % (ref 0.0–5.0)
HCT: 31.5 % — ABNORMAL LOW (ref 39.0–52.0)
Hemoglobin: 10.7 g/dL — ABNORMAL LOW (ref 13.0–17.0)
Lymphocytes Relative: 23.8 % (ref 12.0–46.0)
Lymphs Abs: 1.8 10*3/uL (ref 0.7–4.0)
MCHC: 33.9 g/dL (ref 30.0–36.0)
MCV: 101.8 fl — ABNORMAL HIGH (ref 78.0–100.0)
Monocytes Absolute: 0.6 10*3/uL (ref 0.1–1.0)
Monocytes Relative: 8.3 % (ref 3.0–12.0)
Neutro Abs: 4.7 10*3/uL (ref 1.4–7.7)
Neutrophils Relative %: 63.3 % (ref 43.0–77.0)
Platelets: 164 10*3/uL (ref 150.0–400.0)
RBC: 3.09 Mil/uL — ABNORMAL LOW (ref 4.22–5.81)
RDW: 17.6 % — ABNORMAL HIGH (ref 11.5–15.5)
WBC: 7.4 10*3/uL (ref 4.0–10.5)

## 2020-03-29 LAB — CALCIUM: Calcium: 10.6 mg/dL — ABNORMAL HIGH (ref 8.4–10.5)

## 2020-03-29 NOTE — Telephone Encounter (Signed)
Spoke to patient's wife to to let her know MD's recommendation for lab work today and follow up office visit. Patient scheduled with Alonza Bogus 04/14/20 next available appointment per Dr Bryan Lemma. Patient will be notified of lab results and the next plan of care. All questions answered.Wife voiced understanding.

## 2020-03-29 NOTE — Telephone Encounter (Signed)
Agree with plan for CBC check and OV next available with me or one of the APPs. Depending on CBC results, could need repeat endoscopy given hx of AVMs. Thanks.

## 2020-03-29 NOTE — Telephone Encounter (Signed)
Spoke to patient's wife [name] who states that patient has had a loss of appetite over the past 2 weeks  black stools for 1 week.He complains of left lower abdominal pain. No N/V or fever. Patient reports feeling fatigued. Wife is requesting labs as there are no immediate office visits today. Patient has a history of -2 AVMs in D3/D4, 3 AVMs and proximal jejunum, all treated with APC.

## 2020-03-30 DIAGNOSIS — D509 Iron deficiency anemia, unspecified: Secondary | ICD-10-CM | POA: Diagnosis not present

## 2020-03-30 DIAGNOSIS — E1129 Type 2 diabetes mellitus with other diabetic kidney complication: Secondary | ICD-10-CM | POA: Diagnosis not present

## 2020-03-30 DIAGNOSIS — N186 End stage renal disease: Secondary | ICD-10-CM | POA: Diagnosis not present

## 2020-03-30 DIAGNOSIS — Z992 Dependence on renal dialysis: Secondary | ICD-10-CM | POA: Diagnosis not present

## 2020-03-30 DIAGNOSIS — E119 Type 2 diabetes mellitus without complications: Secondary | ICD-10-CM | POA: Diagnosis not present

## 2020-03-30 LAB — FRUCTOSAMINE: Fructosamine: 322 umol/L — ABNORMAL HIGH (ref 0–285)

## 2020-03-30 NOTE — Telephone Encounter (Signed)
Patient's wife called stating his other doctor would like him to cancel his prolia injection and patient's wife asked if Olen Cordial could give her a call to discuss. Ph# [phone]

## 2020-03-31 ENCOUNTER — Encounter: Payer: Self-pay | Admitting: Endocrinology

## 2020-03-31 ENCOUNTER — Other Ambulatory Visit: Payer: Self-pay

## 2020-03-31 ENCOUNTER — Ambulatory Visit (INDEPENDENT_AMBULATORY_CARE_PROVIDER_SITE_OTHER): Payer: Medicare Other | Admitting: Endocrinology

## 2020-03-31 ENCOUNTER — Ambulatory Visit: Payer: Medicare Other | Admitting: Endocrinology

## 2020-03-31 VITALS — BP 118/70 | HR 67 | Ht 71.0 in | Wt 170.0 lb

## 2020-03-31 DIAGNOSIS — E1165 Type 2 diabetes mellitus with hyperglycemia: Secondary | ICD-10-CM | POA: Diagnosis not present

## 2020-03-31 DIAGNOSIS — Z794 Long term (current) use of insulin: Secondary | ICD-10-CM | POA: Diagnosis not present

## 2020-03-31 DIAGNOSIS — N2581 Secondary hyperparathyroidism of renal origin: Secondary | ICD-10-CM

## 2020-03-31 LAB — POCT GLYCOSYLATED HEMOGLOBIN (HGB A1C): Hemoglobin A1C: 5.3 % (ref 4.0–5.6)

## 2020-03-31 NOTE — Progress Notes (Signed)
Patient ID: [name], male   DOB: 08-29-1941, 79 y.o.   MRN: [id number]           Reason for Appointment:  Follow-up for endocrinology problems   History of Present Illness:          Date of diagnosis of type 2 diabetes mellitus:  1996      Background history:  He has had long-standing diabetes probably treated with metformin initially and subsequently with sulfonylurea drugs At some point he was also given Actos in addition to his glipizide which he is still taking Records of his control are only available for the last 4 years not on Over the last few years his A1c has been generally in the 6-7% range His A1c was 6.2 in May 2016 and apparently was starting to get low normal blood sugars at times He was admitted to the hospital for fluid overload and renal failure and at that time his Actos was stopped Subsequently his blood sugars had been markedly increased He was switched from regular insulin to NovoLog on his initial consultation  Recent history:   INSULIN regimen: NovoLog 6 units with breakfast and 4--4 units before dinner Oral hypoglycemic drugs: Tradjenta 5 mg daily  His A1c tends to be falsely low, now 5.3 compared to 5.4   Fructosamine in relatively higher at 322, was at 293 previously  Current blood sugar patterns and problems identified:  His wife is again managing his diabetes including monitoring and insulin regimen  He is apparently eating smaller portions with somewhat decreased appetite and not snacking as much during dialysis  Previously was also eating candy and a lot of carbohydrates at dialysis causing his sugars to be high when he would come back  Blood sugars are excellent and usually within the target range in the evenings and afternoons also  His wife is not checking his blood sugars consistently after lunch and dinner and mostly in the mornings  Again fasting blood sugars are excellent without any basal insulin  As before his wife likes to  give him the insulin after he finishes eating especially blood sugars are normal  No hypoglycemia  Also is continuing on Tradjenta  Still is having some weakness and not able to exercise any   Dialysis is done at 12 pm, doing it on Monday/Wednesday/Fridays     Side effects from medications have been: None  Compliance with the medical regimen: Fair  Glucose monitoring:  done up to 3  times a day         Glucometer:  Freestyle  Blood Glucose readings by review of monitor Download and averages:   PRE-MEAL Fasting Lunch Dinner Bedtime Overall  Glucose range:    112-159   81-180  Mean/median:  97   138   117   POST-MEAL PC Breakfast PC Lunch PC Dinner  Glucose range:    81-180  Mean/median:    128   Previous readings:   PRE-MEAL Fasting Lunch Dinner Bedtime Overall  Glucose range:  86-118   127-259  156, 103   Mean/median:  104   181  141      Self-care: The diet that the patient has been following is: tries to limit sweets, portions. eating out only about once a week at a steak house        Typical meal intake: Breakfast is variable, ham biscuit; supper 7-8 pm            Dietician visit, most recent: 08/2017  Exercise:  Minimal, using walker to move around  Weight history:   Wt Readings from Last 3 Encounters:  03/31/20 170 lb (77.1 kg)  01/05/20 173 lb (78.5 kg)  12/29/19 171 lb 6.4 oz (77.7 kg)     Glycemic control:    Lab Results  Component Value Date   HGBA1C 5.3 03/31/2020   HGBA1C 5.4 12/15/2019   HGBA1C 5.7 09/15/2019   Lab Results  Component Value Date   MICROALBUR 37.2 09/03/2016   LDLCALC 22 03/29/2020   CREATININE 5.80 (H) 12/03/2019    Lab Results  Component Value Date   FRUCTOSAMINE 322 (H) 03/29/2020   FRUCTOSAMINE 293 (H) 12/15/2019   FRUCTOSAMINE 323 (H) 09/15/2019    PROBLEM 2 Osteoporosis: See review of systems     Allergies as of 03/31/2020      Reactions   Ambien [zolpidem Tartrate] Other (See Comments)    hallucinations and "felt crazy"    Penicillins Rash   Has patient had a PCN reaction causing immediate rash, facial/tongue/throat swelling, SOB or lightheadedness with hypotension: Yes Has patient had a PCN reaction causing severe rash involving mucus membranes or skin necrosis: Yes Has patient had a PCN reaction that required hospitalization: No Has patient had a PCN reaction occurring within the last 10 years: No If all of the above answers are "NO", then may proceed with Cephalosporin use.      Medication List       Accurate as of March 31, 2020 11:59 PM. If you have any questions, ask your nurse or doctor.        acetaminophen 325 MG tablet Commonly known as: TYLENOL Take 1-2 tablets (325-650 mg total) by mouth every 4 (four) hours as needed for mild pain. What changed:   how much to take  when to take this   allopurinol 100 MG tablet Commonly known as: ZYLOPRIM Take 100 mg by mouth daily.   camphor-menthol lotion Commonly known as: SARNA Apply 1 application topically 4 (four) times daily as needed for itching.   Carboxymethylcellulose Sod PF 0.25 % Soln Place 1 drop into both eyes 4 (four) times daily as needed (dry/irritated eyes.).   cetirizine 10 MG chewable tablet Commonly known as: ZYRTEC Chew 10 mg by mouth daily as needed (allergies.).   clobetasol 0.05 % external solution Commonly known as: TEMOVATE Apply 1 application topically 2 (two) times daily as needed (scalp irritation.).   denosumab 60 MG/ML Sosy injection Commonly known as: PROLIA Inject 60 mg into the skin every 6 (six) months.   diclofenac Sodium 1 % Gel Commonly known as: VOLTAREN   donepezil 10 MG tablet Commonly known as: ARICEPT Take 10 mg by mouth at bedtime.   feeding supplement (NEPRO CARB STEADY) Liqd Take 237 mLs by mouth in the morning and at bedtime.   Fluocinolone Acetonide Scalp 0.01 % Oil Apply 1 application topically daily as needed (scalp irritation.).   fluticasone  0.05 % cream Commonly known as: CUTIVATE Apply 1 application topically 2 (two) times daily as needed (irritation). Apply to face   fluticasone 50 MCG/ACT nasal spray Commonly known as: FLONASE Place 1 spray into both nostrils daily. What changed:   when to take this  reasons to take this   FreeStyle Freedom Lite w/Device Kit Use to check blood sugar 2 times per day dx code E11.65   freestyle lancets Use as instructed to check blood sugar 2 times per day dx code E11.65   FREESTYLE LITE test strip Generic drug: glucose  blood USE THREE TIMES DAILY   hydrocortisone 2.5 % cream Apply 1 application topically 3 (three) times daily as needed (skin irritation (legs & arms)).   insulin aspart 100 UNIT/ML FlexPen Commonly known as: NovoLOG FlexPen INJECT 6-8 UNITS UNDER THE SKIN THREE TIMES DAILY BEFORE MEALS. DX:E11.65 What changed:   how much to take  how to take this  when to take this  additional instructions   ketoconazole 2 % shampoo Commonly known as: Nizoral Apply 1 application topically 2 (two) times a week. What changed:   when to take this  reasons to take this   levothyroxine 50 MCG tablet Commonly known as: SYNTHROID Take 1 tablet (50 mcg total) by mouth daily. What changed: when to take this   lidocaine 5 % Commonly known as: LIDODERM Place 1 patch onto the skin daily.   memantine 5 MG tablet Commonly known as: Namenda Take 1 tablet (5 mg total) by mouth 2 (two) times daily.   midodrine 10 MG tablet Commonly known as: PROAMATINE Take one pill prior to hemodialysis on MWF What changed:   how much to take  how to take this  when to take this  additional instructions   Naftifine HCl 2 % Crea Commonly known as: Naftin Apply to feet daily for fungal infection What changed:   how much to take  how to take this  when to take this  reasons to take this  additional instructions   omeprazole 40 MG capsule Commonly known as: PRILOSEC  Take 1 capsule (40 mg total) by mouth daily. What changed: when to take this   Pen Needles 32G X 4 MM Misc 1 each by Does not apply route 3 (three) times daily. USE PEN NEEDLES TO INJECT INSULIN THREE TIMES DAILY.   sevelamer carbonate 800 MG tablet Commonly known as: RENVELA Take 1,200-2,400 mg by mouth See admin instructions. Take 3 tablets (2400 mg) by mouth twice daily with a meal & take 2 tablet (1600 mg) by mouth with a snack   simvastatin 20 MG tablet Commonly known as: ZOCOR Take 20 mg by mouth at bedtime.   Tradjenta 5 MG Tabs tablet Generic drug: linagliptin TAKE 1 TABLET DAILY   TUMS PO Take 2 tablets by mouth at bedtime.       Allergies:  Allergies  Allergen Reactions  . Ambien [Zolpidem Tartrate] Other (See Comments)    hallucinations and "felt crazy"   . Penicillins Rash    Has patient had a PCN reaction causing immediate rash, facial/tongue/throat swelling, SOB or lightheadedness with hypotension: Yes Has patient had a PCN reaction causing severe rash involving mucus membranes or skin necrosis: Yes Has patient had a PCN reaction that required hospitalization: No Has patient had a PCN reaction occurring within the last 10 years: No If all of the above answers are "NO", then may proceed with Cephalosporin use.     Past Medical History:  Diagnosis Date  . Allergy   . Anemia   . Arthritis   . Cataract    bil cateracts removed  . Coronary artery disease   . Dementia arising in the senium and presenium ([place])   . Diabetes mellitus    Type 2  . Diverticulitis   . ED (erectile dysfunction)   . Elevated homocysteine   . ESRD (end stage renal disease) on dialysis ([place]) 03/2015  . GERD (gastroesophageal reflux disease)    pepto   . Gout   . Hiatal hernia   . Hyperlipidemia   .  Hypertension   . Hypothyroidism   . Pneumonia   . PVD (peripheral vascular disease) ([place])    has plastic aorta  . Seasonal allergies   . Sleep apnea    does not wear c-pap     Past Surgical History:  Procedure Laterality Date  . aortobifemoral bypass    . AV FISTULA PLACEMENT Left 12/01/2013   Procedure: ARTERIOVENOUS (AV) FISTULA CREATION- LEFT BRACHIOCEPHALIC;  Surgeon: Angelia Mould, MD;  Location: [facility];  Service: Vascular;  Laterality: Left;  . [place] TRANSPOSITION Right 07/27/2014   Procedure: BASCILIC VEIN TRANSPOSITION;  Surgeon: Angelia Mould, MD;  Location: [facility];  Service: Vascular;  Laterality: Right;  . BRAIN SURGERY  07/22/2018  . BREAST SURGERY     left - granulomatous mastitis  . BURR HOLE Bilateral 07/22/2018   Procedure: BILATERAL BURR HOLES;  Surgeon: Kristeen Miss, MD;  Location: [facility];  Service: Neurosurgery;  Laterality: Bilateral;  . COLONOSCOPY    . 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 [facility])  1992  . ENTEROSCOPY N/A 06/03/2018   Procedure: ENTEROSCOPY;  Surgeon: Lavena Bullion, DO;  Location: [facility];  Service: Gastroenterology;  Laterality: N/A;  . ENTEROSCOPY N/A 11/20/2018   Procedure: ENTEROSCOPY;  Surgeon: Rush Landmark Telford Nab., MD;  Location: [facility] ENDOSCOPY;  Service: Gastroenterology;  Laterality: N/A;  . ENTEROSCOPY N/A 12/03/2019   Procedure: ENTEROSCOPY;  Surgeon: Lavena Bullion, DO;  Location: [facility] ENDOSCOPY;  Service: Gastroenterology;  Laterality: N/A;  push enteroscopy  . EYE SURGERY Bilateral    cataracts  . HEMODIALYSIS INPATIENT  01/17/2018      . HOT HEMOSTASIS N/A 06/03/2018   Procedure: HOT HEMOSTASIS (ARGON PLASMA COAGULATION/BICAP);  Surgeon: Lavena Bullion, DO;  Location: [facility] ENDOSCOPY;  Service: Gastroenterology;  Laterality: N/A;  . HOT HEMOSTASIS N/A 11/20/2018   Procedure: HOT HEMOSTASIS (ARGON PLASMA COAGULATION/BICAP);  Surgeon: Irving Copas., MD;  Location: [facility];  Service: Gastroenterology;  Laterality: N/A;  . HOT HEMOSTASIS N/A 12/03/2019   Procedure: HOT HEMOSTASIS (ARGON PLASMA COAGULATION/BICAP);  Surgeon: Lavena Bullion, DO;  Location: [facility] ENDOSCOPY;   Service: Gastroenterology;  Laterality: N/A;  . REVISION OF ARTERIOVENOUS GORETEX GRAFT Right 09/01/2019   Procedure: REVISION OF ARTERIOVENOUS FISTULA RIGHT ARM;  Surgeon: Serafina Mitchell, MD;  Location: [facility];  Service: Vascular;  Laterality: Right;  . REVISON OF ARTERIOVENOUS FISTULA Left 02/09/2014   Procedure: REVISON OF LEFT ARTERIOVENOUS FISTULA - RESECTION OF RENDUNDANT VEIN;  Surgeon: Angelia Mould, MD;  Location: [facility];  Service: Vascular;  Laterality: Left;  . SBO with lysis adhesions    . SHUNTOGRAM Left 04/19/2014   Procedure: FISTULOGRAM;  Surgeon: Angelia Mould, MD;  Location[facility] CATH LAB;  Service: Cardiovascular;  Laterality: Left;  . UNILATERAL UPPER EXTREMEITY ANGIOGRAM N/A 07/12/2014   Procedure: UNILATERAL UPPER Anselmo Rod;  Surgeon: Angelia Mould, MD;  Location: [facility] CATH LAB;  Service: Cardiovascular;  Laterality: N/A;    Family History  Problem Relation Age of Onset  . Aneurysm Mother        brain  . Heart disease Father   . Stroke Father   . Hypertension Father   . Diabetes Father   . Dementia Neg Hx   . Colon cancer Neg Hx   . Esophageal cancer Neg Hx   . Pancreatic cancer Neg Hx   . Prostate cancer Neg Hx   . Rectal cancer Neg Hx   . Stomach cancer Neg Hx  Social History:  reports that he quit smoking about 25 years ago. He has never used smokeless tobacco. He reports that he does not drink alcohol and does not use drugs.    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 he was not given Forteo  He has been on Prolia since 5/19 and last injection was in 12/20  Calcium has been variable, also followed by nephrologist His calcium is higher than normal Apparently his wife was told  that he should not take Prolia by the nephrologist since he previously had low blood calcium levels after the injection Ionized calcium level prior to his last injection was low normal  PTH values not available   Lab Results  Component Value Date   PTH 154 (H) 10/06/2014   CALCIUM 10.6 (H) 03/29/2020   CAION 1.17 12/03/2019   PHOS 4.0 06/16/2019     Lipid history: He has been treated with Simvastatin 20 mg  by PCP, results as follows    Lab Results  Component Value Date   CHOL 81 03/29/2020   HDL 39.50 03/29/2020   [place] 22 03/29/2020   TRIG 101.0 03/29/2020   CHOLHDL 2 03/29/2020          He has ESRD on dialysis    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  Is on 50 mcg levothyroxine with normal TSH levels, this has been prescribed by PCP  Lab Results  Component Value Date   TSH 1.90 03/29/2020      Physical Examination:  BP 118/70 (BP Location: Left Arm, Patient Position: Sitting, Cuff Size: Large)   Pulse 67   Ht 5' 11" (1.803 m)   Wt 170 lb (77.1 kg)   SpO2 99%   BMI 23.71 kg/m      ASSESSMENT:  Diabetes type 2  See history of present illness for discussion of his current management, blood sugar patterns and problems identified  He is being treated with mealtime NovoLog and Tradjenta  A1c is falsely low at only 5.3 This is despite his fructosamine being higher Also not clear why his blood sugars are much better overall nonfasting compared to his last visit, may be related to better diet less carbohydrate Still requiring mealtime insulin although small doses  As usual the fasting readings are fairly good with Tradjenta alone and no basal insulin    OSTEOPOROSIS with history of fracture: Has been well with Prolia Needs continued Prolia for severe osteoporosis but unclear whether he had a problem with hypocalcemia after his last injection Since his calcium is now above normal please  should be safe to take Discussed with nephrologist who will review the records in the next day from his office and let us know what his history has been  Will be scheduled for Prolia when this is sorted   PLAN:   No change in insulin However if he is eating a smaller meal or less carbs at dinnertime he can reduce his NovoLog to 3 units instead of 4  We will repeat prior history of calcium levels when available from nephrologist office and discuss further if needed    Patient Instructions  Take 3 Novolog at dinner  if eating less  Check blood sugars on waking up 2-3 days a week  Also check blood sugars about 2 hours after meals and do this after different meals by rotation  Recommended blood sugar levels on waking up are 90-130 and about 2 hours after meal is 130-160  Please bring your blood sugar monitor to each visit, thank you        Elayne Snare 04/01/2020, 12:38 PM   Note: This office note was prepared with Dragon voice recognition system technology. Any transcriptional errors that result from this process are unintentional.

## 2020-03-31 NOTE — Telephone Encounter (Signed)
Discontinue Prolia.  Will discuss with his wife this afternoon at his visit

## 2020-03-31 NOTE — Telephone Encounter (Signed)
Called pt's wife and spoke with her regarding Prolia. She stated that Sharmaine Base and Dr. Titus Dubin and [facility] recommend that the pt discontinue Prolia because his Calcium levels go too low after having the injection, and they struggle at Dialysis to get the level back up each time. Mrs. Goens would like to know if Dr. Dwyane Dee would speak with Dr. Titus Dubin or Joellen Jersey at the dialysis center to further discuss this.

## 2020-03-31 NOTE — Patient Instructions (Signed)
Take 3 Novolog at dinner if eating less  Check blood sugars on waking up 2-3 days a week  Also check blood sugars about 2 hours after meals and do this after different meals by rotation  Recommended blood sugar levels on waking up are 90-130 and about 2 hours after meal is 130-160  Please bring your blood sugar monitor to each visit, thank you

## 2020-03-31 NOTE — Telephone Encounter (Signed)
Noted. Will wait for further instruction from Dr. Dwyane Dee after pt's visit today.

## 2020-03-31 NOTE — Telephone Encounter (Signed)
Will call pt's wife this morning to discuss.

## 2020-04-01 DIAGNOSIS — D509 Iron deficiency anemia, unspecified: Secondary | ICD-10-CM | POA: Diagnosis not present

## 2020-04-01 DIAGNOSIS — E119 Type 2 diabetes mellitus without complications: Secondary | ICD-10-CM | POA: Diagnosis not present

## 2020-04-01 DIAGNOSIS — N186 End stage renal disease: Secondary | ICD-10-CM | POA: Diagnosis not present

## 2020-04-01 DIAGNOSIS — Z992 Dependence on renal dialysis: Secondary | ICD-10-CM | POA: Diagnosis not present

## 2020-04-01 DIAGNOSIS — E1129 Type 2 diabetes mellitus with other diabetic kidney complication: Secondary | ICD-10-CM | POA: Diagnosis not present

## 2020-04-04 DIAGNOSIS — E1129 Type 2 diabetes mellitus with other diabetic kidney complication: Secondary | ICD-10-CM | POA: Diagnosis not present

## 2020-04-04 DIAGNOSIS — D509 Iron deficiency anemia, unspecified: Secondary | ICD-10-CM | POA: Diagnosis not present

## 2020-04-04 DIAGNOSIS — N186 End stage renal disease: Secondary | ICD-10-CM | POA: Diagnosis not present

## 2020-04-04 DIAGNOSIS — Z992 Dependence on renal dialysis: Secondary | ICD-10-CM | POA: Diagnosis not present

## 2020-04-04 DIAGNOSIS — E119 Type 2 diabetes mellitus without complications: Secondary | ICD-10-CM | POA: Diagnosis not present

## 2020-04-06 DIAGNOSIS — E1129 Type 2 diabetes mellitus with other diabetic kidney complication: Secondary | ICD-10-CM | POA: Diagnosis not present

## 2020-04-06 DIAGNOSIS — N186 End stage renal disease: Secondary | ICD-10-CM | POA: Diagnosis not present

## 2020-04-06 DIAGNOSIS — E119 Type 2 diabetes mellitus without complications: Secondary | ICD-10-CM | POA: Diagnosis not present

## 2020-04-06 DIAGNOSIS — D509 Iron deficiency anemia, unspecified: Secondary | ICD-10-CM | POA: Diagnosis not present

## 2020-04-06 DIAGNOSIS — Z992 Dependence on renal dialysis: Secondary | ICD-10-CM | POA: Diagnosis not present

## 2020-04-08 DIAGNOSIS — Z992 Dependence on renal dialysis: Secondary | ICD-10-CM | POA: Diagnosis not present

## 2020-04-08 DIAGNOSIS — E119 Type 2 diabetes mellitus without complications: Secondary | ICD-10-CM | POA: Diagnosis not present

## 2020-04-08 DIAGNOSIS — D509 Iron deficiency anemia, unspecified: Secondary | ICD-10-CM | POA: Diagnosis not present

## 2020-04-08 DIAGNOSIS — N186 End stage renal disease: Secondary | ICD-10-CM | POA: Diagnosis not present

## 2020-04-08 DIAGNOSIS — E1129 Type 2 diabetes mellitus with other diabetic kidney complication: Secondary | ICD-10-CM | POA: Diagnosis not present

## 2020-04-11 DIAGNOSIS — E119 Type 2 diabetes mellitus without complications: Secondary | ICD-10-CM | POA: Diagnosis not present

## 2020-04-11 DIAGNOSIS — D509 Iron deficiency anemia, unspecified: Secondary | ICD-10-CM | POA: Diagnosis not present

## 2020-04-11 DIAGNOSIS — Z992 Dependence on renal dialysis: Secondary | ICD-10-CM | POA: Diagnosis not present

## 2020-04-11 DIAGNOSIS — N186 End stage renal disease: Secondary | ICD-10-CM | POA: Diagnosis not present

## 2020-04-11 DIAGNOSIS — E1129 Type 2 diabetes mellitus with other diabetic kidney complication: Secondary | ICD-10-CM | POA: Diagnosis not present

## 2020-04-13 DIAGNOSIS — E1129 Type 2 diabetes mellitus with other diabetic kidney complication: Secondary | ICD-10-CM | POA: Diagnosis not present

## 2020-04-13 DIAGNOSIS — E119 Type 2 diabetes mellitus without complications: Secondary | ICD-10-CM | POA: Diagnosis not present

## 2020-04-13 DIAGNOSIS — Z992 Dependence on renal dialysis: Secondary | ICD-10-CM | POA: Diagnosis not present

## 2020-04-13 DIAGNOSIS — N186 End stage renal disease: Secondary | ICD-10-CM | POA: Diagnosis not present

## 2020-04-13 DIAGNOSIS — D509 Iron deficiency anemia, unspecified: Secondary | ICD-10-CM | POA: Diagnosis not present

## 2020-04-14 ENCOUNTER — Encounter: Payer: Self-pay | Admitting: Gastroenterology

## 2020-04-14 ENCOUNTER — Other Ambulatory Visit (INDEPENDENT_AMBULATORY_CARE_PROVIDER_SITE_OTHER): Payer: Medicare Other

## 2020-04-14 ENCOUNTER — Ambulatory Visit (INDEPENDENT_AMBULATORY_CARE_PROVIDER_SITE_OTHER): Payer: Medicare Other | Admitting: Gastroenterology

## 2020-04-14 VITALS — BP 134/66 | HR 82 | Ht 71.0 in | Wt 171.5 lb

## 2020-04-14 DIAGNOSIS — D649 Anemia, unspecified: Secondary | ICD-10-CM | POA: Diagnosis not present

## 2020-04-14 DIAGNOSIS — R195 Other fecal abnormalities: Secondary | ICD-10-CM

## 2020-04-14 DIAGNOSIS — K5520 Angiodysplasia of colon without hemorrhage: Secondary | ICD-10-CM | POA: Diagnosis not present

## 2020-04-14 DIAGNOSIS — K625 Hemorrhage of anus and rectum: Secondary | ICD-10-CM

## 2020-04-14 LAB — CBC WITH DIFFERENTIAL/PLATELET
Basophils Absolute: 0.1 10*3/uL (ref 0.0–0.1)
Basophils Relative: 1 % (ref 0.0–3.0)
Eosinophils Absolute: 0.3 10*3/uL (ref 0.0–0.7)
Eosinophils Relative: 4 % (ref 0.0–5.0)
HCT: 34.5 % — ABNORMAL LOW (ref 39.0–52.0)
Hemoglobin: 11.3 g/dL — ABNORMAL LOW (ref 13.0–17.0)
Lymphocytes Relative: 29.5 % (ref 12.0–46.0)
Lymphs Abs: 2 10*3/uL (ref 0.7–4.0)
MCHC: 32.8 g/dL (ref 30.0–36.0)
MCV: 104.9 fl — ABNORMAL HIGH (ref 78.0–100.0)
Monocytes Absolute: 0.6 10*3/uL (ref 0.1–1.0)
Monocytes Relative: 9.2 % (ref 3.0–12.0)
Neutro Abs: 3.8 10*3/uL (ref 1.4–7.7)
Neutrophils Relative %: 56.3 % (ref 43.0–77.0)
Platelets: 146 10*3/uL — ABNORMAL LOW (ref 150.0–400.0)
RBC: 3.29 Mil/uL — ABNORMAL LOW (ref 4.22–5.81)
RDW: 19.9 % — ABNORMAL HIGH (ref 11.5–15.5)
WBC: 6.8 10*3/uL (ref 4.0–10.5)

## 2020-04-14 MED ORDER — HYDROCORTISONE ACETATE 25 MG RE SUPP
25.0000 mg | Freq: Every day | RECTAL | 2 refills | Status: DC
Start: 2020-04-14 — End: 2020-07-28

## 2020-04-14 NOTE — Progress Notes (Signed)
04/14/2020 NOA CONSTANTE [id number] 12/31/1940   HISTORY OF PRESENT ILLNESS:  This is a 79 y.o.malewho is a patient of Dr. Vivia Ewing.  Per his note from 01/05/20:   "He has PMH ofESRDon hemodialysis, CAD, HTN,AAA repair/aortobifemoral bypass 1992,DM II, Hypothyroidism, GERD, subdural hematomas/pfall requiring subdural evacuation in 06/2018, diverticulitis, HX of UGI bleed9/2019(gastric AVMs)and readmitted in 10/2018 with melena, requiring 1 unit PRBCs (Hgbnadir 7.6). CT unrevealing. Small bowel enteroscopy completed 11/20/2018 and n/fbleeding in gastric cardia (APC), AVM in gastric fundus (APC), gastritis, oozing x2 in the duodenal bulb (APC), 3 AVMs in proximal jejunum (APC). Discharge hemoglobin 8.6and repeat in 11/2018 was 11.1 and 10.6 in late March. IV iron infusion with HD in 12/2018.  Developed melena in 10/2019 with H/H 10.6/33 (stable).  CT abdomen/pelvis unremarkable for acute issues.  Endoscopic history: Small bowel enteroscopy 12/03/2019 by Dr. Bryan Lemma: -2 cm HH -Nonobstructing Schatzki's ring -Normal stomach -2 AVMs in D3/D4, 3 AVMs and proximal jejunum, all treated with APC -Tattoo seen jejunum from prior enteroscopy  Small bowel capsule endoscopy 03/17/2019: 1. Complete study, adequate prep. 2. No active bleeding/oozing. 3. Few tiny red spots/possible tiny AVMs in the mid small bowel around 2 hours 30 minutes. 4. Distal small bowel AVMs at 7 hours 13 minutes range.  Smallbowel enteroscopy 2/27/2020by Dr. Rush Landmark: - No gross lesions in esophagus. - A single spot with bleeding in the stomach. Treated with argon plasma coagulation (APC). - A single non-bleeding angioectasia in the stomach. Treated with argon plasma coagulation (APC). - Erythematous mucosa in the greater curvature. - No other gross lesions in the stomach. - Two spots with oozing in the duodenum. Treated with argon plasma coagulation (APC). - Normal mucosa was found in  the entire examined duodenum otherwise. - Three non-bleeding angioectasias in the jejunum. Treated with argon plasma coagulation (APC). - Normal mucosa was found in the jejunum otherwise. Tattooed.  Small bowel enteroscopy09/06/2018 done due to UGI bleed/melena/anemiaby Dr. Bryan Lemma: - The examined portion of the jejunum was normal. - Normal duodenal bulb, first portion of the duodenum, second portion of the duodenum, third portion of the duodenum and fourth portion of the duodenum. - Normal esophagus. - A single recently bleeding angioectasia in the stomach. Treated with argon plasma coagulation (APC). - Normal gastric body, antrum and pylorus. - No specimens collected.  EGD 11/12/2017 done due to [facility] Dr. Silverio Decamp. - Benign-appearing esophageal stenosis. - Erythematous mucosa in the stomach.No H. Pylori. - Normal examined duodenum.  Colonoscopy 11/12/2017 done due to [facility] Dr. Silverio Decamp: - The perianal and digital rectal examinations were normal. - A 9 mmtubulovillouspolyp was found in the transverse colon. The polyp was sessile. The polyp was removed with a cold snare. Resection and retrieval were complete. - A 1 mmhyperplasticpolyp was found in the recto-sigmoid colon. The polyp was sessile. The polyp was removed with a cold biopsy forceps. Resection and retrieval were complete. - A single localized non-bleeding erosion was found in the transverse colon. No stigmata of recent bleeding were seen. - A few small and large-mouthed diverticula were found in the sigmoid colon, descending colon, transverse colon, hepatic flexure and ascending colon. - Non-bleeding internal hemorrhoids were found during retroflexion. The hemorrhoids were small."  His wife called our office on July 6 stating that the patient was having dark stools and wanted his hemoglobin rechecked. Hemoglobin was stable around his baseline at 10.7 g. Dr. Bryan Lemma recommended proceeding with already  scheduled office visit follow-up. At this point he tells me that his  stools have lightened in color again. He describes intermittent bright red blood on the toilet paper upon wiping. Says that his bottom gets irritated and sore as well.  Reports having a lot of gas and still with looser than normal stools.  Taking 1 Tbsp of Metacmucil daily.   Past Medical History:  Diagnosis Date  . Allergy   . Anemia   . Arthritis   . Cataract    bil cateracts removed  . Coronary artery disease   . Dementia arising in the senium and presenium ([place])   . Diabetes mellitus    Type 2  . Diverticulitis   . ED (erectile dysfunction)   . Elevated homocysteine   . ESRD (end stage renal disease) on dialysis ([place]) 03/2015  . GERD (gastroesophageal reflux disease)    pepto   . Gout   . Hiatal hernia   . Hyperlipidemia   . Hypertension   . Hypothyroidism   . Pneumonia   . PVD (peripheral vascular disease) ([place])    has plastic aorta  . Seasonal allergies   . Sleep apnea    does not wear c-pap   Past Surgical History:  Procedure Laterality Date  . aortobifemoral bypass    . AV FISTULA PLACEMENT Left 12/01/2013   Procedure: ARTERIOVENOUS (AV) FISTULA CREATION- LEFT BRACHIOCEPHALIC;  Surgeon: Angelia Mould, MD;  Location: [place];  Service: Vascular;  Laterality: Left;  . [place] TRANSPOSITION Right 07/27/2014   Procedure: BASCILIC VEIN TRANSPOSITION;  Surgeon: Angelia Mould, MD;  Location: [place];  Service: Vascular;  Laterality: Right;  . BRAIN SURGERY  07/22/2018  . BREAST SURGERY     left - granulomatous mastitis  . BURR HOLE Bilateral 07/22/2018   Procedure: BILATERAL BURR HOLES;  Surgeon: Kristeen Miss, MD;  Location: [place];  Service: Neurosurgery;  Laterality: Bilateral;  . COLONOSCOPY    . 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 ENTEROSCOPY N/A 06/03/2018   Procedure: ENTEROSCOPY;  Surgeon: Lavena Bullion, DO;  Location: [place];  Service:  Gastroenterology;  Laterality: N/A;  . ENTEROSCOPY N/A 11/20/2018   Procedure: ENTEROSCOPY;  Surgeon: Rush Landmark Telford Nab., MD;  Location: [facility] ENDOSCOPY;  Service: Gastroenterology;  Laterality: N/A;  . ENTEROSCOPY N/A 12/03/2019   Procedure: ENTEROSCOPY;  Surgeon: Lavena Bullion, DO;  Location: [facility] ENDOSCOPY;  Service: Gastroenterology;  Laterality: N/A;  push enteroscopy  . EYE SURGERY Bilateral    cataracts  . HEMODIALYSIS INPATIENT  01/17/2018      . HOT HEMOSTASIS N/A 06/03/2018   Procedure: HOT HEMOSTASIS (ARGON PLASMA COAGULATION/BICAP);  Surgeon: Lavena Bullion, DO;  Location: [facility] ENDOSCOPY;  Service: Gastroenterology;  Laterality: N/A;  . HOT HEMOSTASIS N/A 11/20/2018   Procedure: HOT HEMOSTASIS (ARGON PLASMA COAGULATION/BICAP);  Surgeon: Irving Copas., MD;  Location: [place];  Service: Gastroenterology;  Laterality: N/A;  . HOT HEMOSTASIS N/A 12/03/2019   Procedure: HOT HEMOSTASIS (ARGON PLASMA COAGULATION/BICAP);  Surgeon: Lavena Bullion, DO;  Location: [facility] ENDOSCOPY;  Service: Gastroenterology;  Laterality: N/A;  . REVISION OF ARTERIOVENOUS GORETEX GRAFT Right 09/01/2019   Procedure: REVISION OF ARTERIOVENOUS FISTULA RIGHT ARM;  Surgeon: Serafina Mitchell, MD;  Location: [facility] OR;  Service: Vascular;  Laterality: Right;  . REVISON OF ARTERIOVENOUS FISTULA Left 02/09/2014   Procedure: REVISON OF LEFT ARTERIOVENOUS FISTULA - RESECTION OF RENDUNDANT VEIN;  Surgeon: Angelia Mould, MD;  Location: [place];  Service: Vascular;  Laterality: Left;  . SBO with lysis adhesions    .  SHUNTOGRAM Left 04/19/2014   Procedure: FISTULOGRAM;  Surgeon: Angelia Mould, MD;  Location: [facility] CATH LAB;  Service: Cardiovascular;  Laterality: Left;  . UNILATERAL UPPER EXTREMEITY ANGIOGRAM N/A 07/12/2014   Procedure: UNILATERAL UPPER Anselmo Rod;  Surgeon: Angelia Mould, MD;  Location: [facility] C[facility] LAB;  Service: Cardiovascular;  Laterality: N/A;    reports that he quit  smoking about 25 years ago. He has never used smokeless tobacco. He reports that he does not drink alcohol and does not use drugs. family history includes Aneurysm in his mother; Diabetes in his father; Heart disease in his father; Hypertension in his father; Stroke in his father. Allergies  Allergen Reactions  . Ambien [Zolpidem Tartrate] Other (See Comments)    hallucinations and "felt crazy"   . Penicillins Rash    Has patient had a PCN reaction causing immediate rash, facial/tongue/throat swelling, SOB or lightheadedness with hypotension: Yes Has patient had a PCN reaction causing severe rash involving mucus membranes or skin necrosis: Yes Has patient had a PCN reaction that required hospitalization: No Has patient had a PCN reaction occurring within the last 10 years: No If all of the above answers are "NO", then may proceed with Cephalosporin use.       Outpatient Encounter Medications as of 04/14/2020  Medication Sig  . acetaminophen (TYLENOL) 325 MG tablet Take 1-2 tablets (325-650 mg total) by mouth every 4 (four) hours as needed for mild pain. (Patient taking differently: Take 650 mg by mouth in the morning and at bedtime. )  . allopurinol (ZYLOPRIM) 100 MG tablet Take 100 mg by mouth daily.   . Blood Glucose Monitoring Suppl (FREESTYLE FREEDOM LITE) w/Device KIT Use to check blood sugar 2 times per day dx code E11.65  . camphor-menthol (SARNA) lotion Apply 1 application topically 4 (four) times daily as needed for itching.   . Carboxymethylcellulose Sod PF 0.25 % SOLN Place 1 drop into both eyes 4 (four) times daily as needed (dry/irritated eyes.).   Marland Kitchen cetirizine (ZYRTEC) 10 MG chewable tablet Chew 10 mg by mouth daily as needed (allergies.).   Marland Kitchen clobetasol (TEMOVATE) 0.05 % external solution Apply 1 application topically 2 (two) times daily as needed (scalp irritation.).   Marland Kitchen diclofenac Sodium (VOLTAREN) 1 % GEL   . donepezil (ARICEPT) 10 MG tablet Take 10 mg by mouth at bedtime.    Marland Kitchen FLUOCINOLONE ACETONIDE SCALP 0.01 % OIL Apply 1 application topically daily as needed (scalp irritation.).   Marland Kitchen fluticasone (CUTIVATE) 0.05 % cream Apply 1 application topically 2 (two) times daily as needed (irritation). Apply to face  . fluticasone (FLONASE) 50 MCG/ACT nasal spray Place 1 spray into both nostrils daily. (Patient taking differently: Place 1 spray into both nostrils daily as needed for allergies. )  . glucose blood (FREESTYLE LITE) test strip USE THREE TIMES DAILY  . hydrocortisone 2.5 % cream Apply 1 application topically 3 (three) times daily as needed (skin irritation (legs & arms)).   Marland Kitchen insulin aspart (NOVOLOG FLEXPEN) 100 UNIT/ML FlexPen INJECT 6-8 UNITS UNDER THE SKIN THREE TIMES DAILY BEFORE MEALS. DX:E11.65 (Patient taking differently: Inject 6-8 Units into the skin 3 (three) times daily with meals. Sliding scale insulin)  . Insulin Pen Needle (PEN NEEDLES) 32G X 4 MM MISC 1 each by Does not apply route 3 (three) times daily. USE PEN NEEDLES TO INJECT INSULIN THREE TIMES DAILY.  Marland Kitchen ketoconazole (NIZORAL) 2 % shampoo Apply 1 application topically 2 (two) times a week. (Patient taking differently: Apply 1  application topically daily as needed for irritation. )  . Lancets (FREESTYLE) lancets Use as instructed to check blood sugar 2 times per day dx code E11.65  . levothyroxine (SYNTHROID, LEVOTHROID) 50 MCG tablet Take 1 tablet (50 mcg total) by mouth daily. (Patient taking differently: Take 50 mcg by mouth daily before breakfast. )  . lidocaine (LIDODERM) 5 % Place 1 patch onto the skin daily.   . memantine (NAMENDA) 5 MG tablet Take 1 tablet (5 mg total) by mouth 2 (two) times daily.  . midodrine (PROAMATINE) 10 MG tablet Take one pill prior to hemodialysis on MWF (Patient taking differently: Take 10 mg by mouth every Monday, Wednesday, and Friday with hemodialysis. )  . Naftifine HCl (NAFTIN) 2 % CREA Apply to feet daily for fungal infection (Patient taking differently: Apply  1 application topically daily as needed (for fungal infection). Apply to feet)  . Nutritional Supplements (FEEDING SUPPLEMENT, NEPRO CARB STEADY,) LIQD Take 237 mLs by mouth in the morning and at bedtime.   Marland Kitchen omeprazole (PRILOSEC) 40 MG capsule Take 1 capsule (40 mg total) by mouth daily. (Patient taking differently: Take 40 mg by mouth daily before breakfast. )  . sevelamer carbonate (RENVELA) 800 MG tablet Take 1,200-2,400 mg by mouth See admin instructions. Take 3 tablets (2400 mg) by mouth twice daily with a meal & take 2 tablet (1600 mg) by mouth with a snack  . simvastatin (ZOCOR) 20 MG tablet Take 20 mg by mouth at bedtime.   . TRADJENTA 5 MG TABS tablet TAKE 1 TABLET DAILY  . [DISCONTINUED] Calcium Carbonate Antacid (TUMS PO) Take 2 tablets by mouth at bedtime.   . [DISCONTINUED] denosumab (PROLIA) 60 MG/ML SOSY injection Inject 60 mg into the skin every 6 (six) months.   No facility-administered encounter medications on file as of 04/14/2020.    REVIEW OF SYSTEMS  : All other systems reviewed and negative except where noted in the History of Present Illness.  PHYSICAL EXAM: BP (!) 134/66   Pulse 82   Ht '5\' 11"'$  (1.803 m)   Wt 171 lb 8 oz (77.8 kg)   BMI 23.92 kg/m  General: Well developed AA male in no acute distress Head: Normocephalic and atraumatic Eyes:  Sclerae anicteric, conjunctiva pink. Ears: Normal auditory acuity Lungs: Clear throughout to auscultation; no increased WOB. Heart: Regular rate and rhythm; no M/R/G. Abdomen: Soft, non-distended.  BS present.  Non-tender. Musculoskeletal: Symmetrical with no gross deformities  Skin: No lesions on visible extremities Extremities: No edema  Neurological: Alert oriented x 4, grossly non-focal Psychological:  Alert and cooperative. Normal mood and affect  ASSESSMENT AND PLAN: *History of small bowel AVMs with previous APC:  Was reporting black stools, but Hgb was stable.  Stools are lighter in color at this point.  Will  repeat CBC today and if stable would hold off on any procedures at this point. *Rectal bleeding:  Intermittent bright red blood on toilet paper.  Likely due to internal hemorrhoids seen on previous colonoscopy in 10/2017.  Will send prescription for hydrocortisone suppositories to use prn.  Can use Desitin on his bottom as well for rash or irritation. *Loose stools:  Was treated for Cdiff in February.  Was described to Dr. Bryan Lemma previously in April and he recommended fiber supplement to help with bulk.  I have asked them to take 2 tsp in 6-8 ounces of liquid BID (suggested Benefiber or Citrucel over Metamucil as they may cause less gas).  CC:  Elby Showers,  MD   

## 2020-04-14 NOTE — Patient Instructions (Addendum)
If you are age 79 or older, your body mass index should be between 23-30. Your Body mass index is 23.92 kg/m. If this is out of the aforementioned range listed, please consider follow up with your Primary Care Provider.  If you are age 38 or younger, your body mass index should be between 19-25. Your Body mass index is 23.92 kg/m. If this is out of the aformentioned range listed, please consider follow up with your Primary Care Provider.   Your provider has requested that you go to the basement level for lab work before leaving today. Press "B" on the elevator. The lab is located at the first door on the left as you exit the elevator.  Due to recent changes in healthcare laws, you may see the results of your imaging and laboratory studies on MyChart before your provider has had a chance to review them.  We understand that in some cases there may be results that are confusing or concerning to you. Not all laboratory results come back in the same time frame and the provider may be waiting for multiple results in order to interpret others.  Please give [place] 48 hours in order for your provider to thoroughly review all the results before contacting the office for clarification of your results.   START Hydrocortisone suppository  1 nightly as needed.  Use [place] extra strength on the bottom,  Change your fiber to 2 teaspoons twice a day and change you supplement to Benefiber or Citrucel once you have completed the Metamucil.  Follow up pending the results of you lab or as directed.

## 2020-04-15 DIAGNOSIS — Z992 Dependence on renal dialysis: Secondary | ICD-10-CM | POA: Diagnosis not present

## 2020-04-15 DIAGNOSIS — N186 End stage renal disease: Secondary | ICD-10-CM | POA: Diagnosis not present

## 2020-04-15 DIAGNOSIS — E1129 Type 2 diabetes mellitus with other diabetic kidney complication: Secondary | ICD-10-CM | POA: Diagnosis not present

## 2020-04-15 DIAGNOSIS — D509 Iron deficiency anemia, unspecified: Secondary | ICD-10-CM | POA: Diagnosis not present

## 2020-04-15 DIAGNOSIS — E119 Type 2 diabetes mellitus without complications: Secondary | ICD-10-CM | POA: Diagnosis not present

## 2020-04-18 DIAGNOSIS — N186 End stage renal disease: Secondary | ICD-10-CM | POA: Diagnosis not present

## 2020-04-18 DIAGNOSIS — E1129 Type 2 diabetes mellitus with other diabetic kidney complication: Secondary | ICD-10-CM | POA: Diagnosis not present

## 2020-04-18 DIAGNOSIS — D509 Iron deficiency anemia, unspecified: Secondary | ICD-10-CM | POA: Diagnosis not present

## 2020-04-18 DIAGNOSIS — Z992 Dependence on renal dialysis: Secondary | ICD-10-CM | POA: Diagnosis not present

## 2020-04-18 DIAGNOSIS — E119 Type 2 diabetes mellitus without complications: Secondary | ICD-10-CM | POA: Diagnosis not present

## 2020-04-19 NOTE — Progress Notes (Signed)
Need to talk to the nephrologist taking care of him at [facility] regarding giving Prolia since calcium is high.  Please have them call me on my cell

## 2020-04-20 ENCOUNTER — Telehealth: Payer: Self-pay | Admitting: Endocrinology

## 2020-04-20 DIAGNOSIS — E119 Type 2 diabetes mellitus without complications: Secondary | ICD-10-CM | POA: Diagnosis not present

## 2020-04-20 DIAGNOSIS — D509 Iron deficiency anemia, unspecified: Secondary | ICD-10-CM | POA: Diagnosis not present

## 2020-04-20 DIAGNOSIS — E1129 Type 2 diabetes mellitus with other diabetic kidney complication: Secondary | ICD-10-CM | POA: Diagnosis not present

## 2020-04-20 DIAGNOSIS — N186 End stage renal disease: Secondary | ICD-10-CM | POA: Diagnosis not present

## 2020-04-20 DIAGNOSIS — Z992 Dependence on renal dialysis: Secondary | ICD-10-CM | POA: Diagnosis not present

## 2020-04-20 DIAGNOSIS — M810 Age-related osteoporosis without current pathological fracture: Secondary | ICD-10-CM

## 2020-04-20 NOTE — Telephone Encounter (Signed)
Please let his wife know that I have talked to Dr. Justin Mend and he did not want him to have Prolia because of potential drop in calcium again even with adjusting his current medications.  I am reordering bone density to check on progress

## 2020-04-20 NOTE — Telephone Encounter (Signed)
Left voicemail for patient to call back. 

## 2020-04-21 NOTE — Telephone Encounter (Signed)
Spoke with patient regarding MD recommendations. Patient verbalized understanding and did not have any questions. Patient will call back to get telephone number to schedule bone density scan. [phone]. They are located at [facility] in the basement.

## 2020-04-21 NOTE — Telephone Encounter (Signed)
Patient's wife returned call and requests to be called at ph# [phone]

## 2020-04-22 ENCOUNTER — Encounter: Payer: Self-pay | Admitting: Gastroenterology

## 2020-04-22 DIAGNOSIS — K625 Hemorrhage of anus and rectum: Secondary | ICD-10-CM | POA: Insufficient documentation

## 2020-04-22 DIAGNOSIS — E1129 Type 2 diabetes mellitus with other diabetic kidney complication: Secondary | ICD-10-CM | POA: Diagnosis not present

## 2020-04-22 DIAGNOSIS — Z992 Dependence on renal dialysis: Secondary | ICD-10-CM | POA: Diagnosis not present

## 2020-04-22 DIAGNOSIS — N186 End stage renal disease: Secondary | ICD-10-CM | POA: Diagnosis not present

## 2020-04-22 DIAGNOSIS — E119 Type 2 diabetes mellitus without complications: Secondary | ICD-10-CM | POA: Diagnosis not present

## 2020-04-22 DIAGNOSIS — D509 Iron deficiency anemia, unspecified: Secondary | ICD-10-CM | POA: Diagnosis not present

## 2020-04-24 DIAGNOSIS — N186 End stage renal disease: Secondary | ICD-10-CM | POA: Diagnosis not present

## 2020-04-24 DIAGNOSIS — Z992 Dependence on renal dialysis: Secondary | ICD-10-CM | POA: Diagnosis not present

## 2020-04-24 DIAGNOSIS — E1122 Type 2 diabetes mellitus with diabetic chronic kidney disease: Secondary | ICD-10-CM | POA: Diagnosis not present

## 2020-04-25 DIAGNOSIS — D509 Iron deficiency anemia, unspecified: Secondary | ICD-10-CM | POA: Diagnosis not present

## 2020-04-25 DIAGNOSIS — N186 End stage renal disease: Secondary | ICD-10-CM | POA: Diagnosis not present

## 2020-04-25 DIAGNOSIS — E1129 Type 2 diabetes mellitus with other diabetic kidney complication: Secondary | ICD-10-CM | POA: Diagnosis not present

## 2020-04-25 DIAGNOSIS — N2581 Secondary hyperparathyroidism of renal origin: Secondary | ICD-10-CM | POA: Diagnosis not present

## 2020-04-25 DIAGNOSIS — Z992 Dependence on renal dialysis: Secondary | ICD-10-CM | POA: Diagnosis not present

## 2020-04-25 DIAGNOSIS — E119 Type 2 diabetes mellitus without complications: Secondary | ICD-10-CM | POA: Diagnosis not present

## 2020-04-27 DIAGNOSIS — E1129 Type 2 diabetes mellitus with other diabetic kidney complication: Secondary | ICD-10-CM | POA: Diagnosis not present

## 2020-04-27 DIAGNOSIS — N186 End stage renal disease: Secondary | ICD-10-CM | POA: Diagnosis not present

## 2020-04-27 DIAGNOSIS — Z992 Dependence on renal dialysis: Secondary | ICD-10-CM | POA: Diagnosis not present

## 2020-04-27 DIAGNOSIS — D509 Iron deficiency anemia, unspecified: Secondary | ICD-10-CM | POA: Diagnosis not present

## 2020-04-27 DIAGNOSIS — E119 Type 2 diabetes mellitus without complications: Secondary | ICD-10-CM | POA: Diagnosis not present

## 2020-04-27 NOTE — Progress Notes (Signed)
Agree with the assessment and plan as outlined by Jessica Zehr, PA-C. ? ?Yanis Juma, DO, FACG ? ?

## 2020-04-29 DIAGNOSIS — D509 Iron deficiency anemia, unspecified: Secondary | ICD-10-CM | POA: Diagnosis not present

## 2020-04-29 DIAGNOSIS — Z992 Dependence on renal dialysis: Secondary | ICD-10-CM | POA: Diagnosis not present

## 2020-04-29 DIAGNOSIS — E1129 Type 2 diabetes mellitus with other diabetic kidney complication: Secondary | ICD-10-CM | POA: Diagnosis not present

## 2020-04-29 DIAGNOSIS — E119 Type 2 diabetes mellitus without complications: Secondary | ICD-10-CM | POA: Diagnosis not present

## 2020-04-29 DIAGNOSIS — N186 End stage renal disease: Secondary | ICD-10-CM | POA: Diagnosis not present

## 2020-05-02 DIAGNOSIS — E1129 Type 2 diabetes mellitus with other diabetic kidney complication: Secondary | ICD-10-CM | POA: Diagnosis not present

## 2020-05-02 DIAGNOSIS — N186 End stage renal disease: Secondary | ICD-10-CM | POA: Diagnosis not present

## 2020-05-02 DIAGNOSIS — Z992 Dependence on renal dialysis: Secondary | ICD-10-CM | POA: Diagnosis not present

## 2020-05-02 DIAGNOSIS — E119 Type 2 diabetes mellitus without complications: Secondary | ICD-10-CM | POA: Diagnosis not present

## 2020-05-02 DIAGNOSIS — D509 Iron deficiency anemia, unspecified: Secondary | ICD-10-CM | POA: Diagnosis not present

## 2020-05-04 DIAGNOSIS — N186 End stage renal disease: Secondary | ICD-10-CM | POA: Diagnosis not present

## 2020-05-04 DIAGNOSIS — Z992 Dependence on renal dialysis: Secondary | ICD-10-CM | POA: Diagnosis not present

## 2020-05-04 DIAGNOSIS — E1129 Type 2 diabetes mellitus with other diabetic kidney complication: Secondary | ICD-10-CM | POA: Diagnosis not present

## 2020-05-04 DIAGNOSIS — E119 Type 2 diabetes mellitus without complications: Secondary | ICD-10-CM | POA: Diagnosis not present

## 2020-05-04 DIAGNOSIS — D509 Iron deficiency anemia, unspecified: Secondary | ICD-10-CM | POA: Diagnosis not present

## 2020-05-04 NOTE — Telephone Encounter (Signed)
After receiving Calcium levels from pt's nephrologist, was a decision made as to whether a pt will be getting Prolia again?

## 2020-05-04 NOTE — Telephone Encounter (Signed)
He will not get Prolia, see last telephone note

## 2020-05-05 NOTE — Telephone Encounter (Signed)
Noted. Will continue with this for the patient.

## 2020-05-06 DIAGNOSIS — D509 Iron deficiency anemia, unspecified: Secondary | ICD-10-CM | POA: Diagnosis not present

## 2020-05-06 DIAGNOSIS — N186 End stage renal disease: Secondary | ICD-10-CM | POA: Diagnosis not present

## 2020-05-06 DIAGNOSIS — E1129 Type 2 diabetes mellitus with other diabetic kidney complication: Secondary | ICD-10-CM | POA: Diagnosis not present

## 2020-05-06 DIAGNOSIS — Z992 Dependence on renal dialysis: Secondary | ICD-10-CM | POA: Diagnosis not present

## 2020-05-06 DIAGNOSIS — E119 Type 2 diabetes mellitus without complications: Secondary | ICD-10-CM | POA: Diagnosis not present

## 2020-05-09 DIAGNOSIS — D509 Iron deficiency anemia, unspecified: Secondary | ICD-10-CM | POA: Diagnosis not present

## 2020-05-09 DIAGNOSIS — N186 End stage renal disease: Secondary | ICD-10-CM | POA: Diagnosis not present

## 2020-05-09 DIAGNOSIS — Z992 Dependence on renal dialysis: Secondary | ICD-10-CM | POA: Diagnosis not present

## 2020-05-09 DIAGNOSIS — E1129 Type 2 diabetes mellitus with other diabetic kidney complication: Secondary | ICD-10-CM | POA: Diagnosis not present

## 2020-05-09 DIAGNOSIS — E119 Type 2 diabetes mellitus without complications: Secondary | ICD-10-CM | POA: Diagnosis not present

## 2020-05-11 DIAGNOSIS — E1129 Type 2 diabetes mellitus with other diabetic kidney complication: Secondary | ICD-10-CM | POA: Diagnosis not present

## 2020-05-11 DIAGNOSIS — Z992 Dependence on renal dialysis: Secondary | ICD-10-CM | POA: Diagnosis not present

## 2020-05-11 DIAGNOSIS — E119 Type 2 diabetes mellitus without complications: Secondary | ICD-10-CM | POA: Diagnosis not present

## 2020-05-11 DIAGNOSIS — D509 Iron deficiency anemia, unspecified: Secondary | ICD-10-CM | POA: Diagnosis not present

## 2020-05-11 DIAGNOSIS — N186 End stage renal disease: Secondary | ICD-10-CM | POA: Diagnosis not present

## 2020-05-12 ENCOUNTER — Encounter: Payer: Self-pay | Admitting: Internal Medicine

## 2020-05-12 ENCOUNTER — Other Ambulatory Visit: Payer: Medicare Other | Admitting: Internal Medicine

## 2020-05-12 ENCOUNTER — Other Ambulatory Visit: Payer: Self-pay

## 2020-05-12 ENCOUNTER — Ambulatory Visit (INDEPENDENT_AMBULATORY_CARE_PROVIDER_SITE_OTHER): Payer: Medicare Other | Admitting: Internal Medicine

## 2020-05-12 VITALS — Temp 98.5°F

## 2020-05-12 DIAGNOSIS — S065X9A Traumatic subdural hemorrhage with loss of consciousness of unspecified duration, initial encounter: Secondary | ICD-10-CM

## 2020-05-12 DIAGNOSIS — N186 End stage renal disease: Secondary | ICD-10-CM

## 2020-05-12 DIAGNOSIS — I10 Essential (primary) hypertension: Secondary | ICD-10-CM | POA: Diagnosis not present

## 2020-05-12 DIAGNOSIS — R05 Cough: Secondary | ICD-10-CM | POA: Diagnosis not present

## 2020-05-12 DIAGNOSIS — R059 Cough, unspecified: Secondary | ICD-10-CM

## 2020-05-12 DIAGNOSIS — E119 Type 2 diabetes mellitus without complications: Secondary | ICD-10-CM | POA: Diagnosis not present

## 2020-05-12 DIAGNOSIS — K5520 Angiodysplasia of colon without hemorrhage: Secondary | ICD-10-CM

## 2020-05-12 DIAGNOSIS — Z992 Dependence on renal dialysis: Secondary | ICD-10-CM

## 2020-05-12 DIAGNOSIS — K31819 Angiodysplasia of stomach and duodenum without bleeding: Secondary | ICD-10-CM | POA: Diagnosis not present

## 2020-05-12 DIAGNOSIS — R0989 Other specified symptoms and signs involving the circulatory and respiratory systems: Secondary | ICD-10-CM

## 2020-05-12 DIAGNOSIS — J069 Acute upper respiratory infection, unspecified: Secondary | ICD-10-CM | POA: Diagnosis not present

## 2020-05-12 DIAGNOSIS — Z125 Encounter for screening for malignant neoplasm of prostate: Secondary | ICD-10-CM

## 2020-05-12 DIAGNOSIS — K922 Gastrointestinal hemorrhage, unspecified: Secondary | ICD-10-CM | POA: Diagnosis not present

## 2020-05-12 DIAGNOSIS — S065XAA Traumatic subdural hemorrhage with loss of consciousness status unknown, initial encounter: Secondary | ICD-10-CM

## 2020-05-12 DIAGNOSIS — E039 Hypothyroidism, unspecified: Secondary | ICD-10-CM | POA: Diagnosis not present

## 2020-05-12 DIAGNOSIS — R413 Other amnesia: Secondary | ICD-10-CM

## 2020-05-12 LAB — COMPLETE METABOLIC PANEL WITH GFR
AG Ratio: 1.4 (calc) (ref 1.0–2.5)
ALT: 9 U/L (ref 9–46)
AST: 23 U/L (ref 10–35)
Albumin: 3.8 g/dL (ref 3.6–5.1)
Alkaline phosphatase (APISO): 132 U/L (ref 35–144)
BUN/Creatinine Ratio: 4 (calc) — ABNORMAL LOW (ref 6–22)
BUN: 27 mg/dL — ABNORMAL HIGH (ref 7–25)
CO2: 32 mmol/L (ref 20–32)
Calcium: 10.3 mg/dL (ref 8.6–10.3)
Chloride: 98 mmol/L (ref 98–110)
Creat: 6.57 mg/dL — ABNORMAL HIGH (ref 0.70–1.18)
GFR, Est African American: 9 mL/min/{1.73_m2} — ABNORMAL LOW (ref >=60)
GFR, Est Non African American: 7 mL/min/{1.73_m2} — ABNORMAL LOW (ref >=60)
Globulin: 2.7 g/dL (calc) (ref 1.9–3.7)
Glucose, Bld: 129 mg/dL — ABNORMAL HIGH (ref 65–99)
Potassium: 4.1 mmol/L (ref 3.5–5.3)
Sodium: 146 mmol/L (ref 135–146)
Total Bilirubin: 0.4 mg/dL (ref 0.2–1.2)
Total Protein: 6.5 g/dL (ref 6.1–8.1)

## 2020-05-12 LAB — TIQ-NTM

## 2020-05-12 LAB — CBC WITH DIFFERENTIAL/PLATELET
Absolute Monocytes: 548 cells/uL (ref 200–950)
Basophils Absolute: 59 cells/uL (ref 0–200)
Basophils Relative: 0.8 %
Eosinophils Absolute: 296 cells/uL (ref 15–500)
Eosinophils Relative: 4 %
HCT: 34.4 % — ABNORMAL LOW (ref 38.5–50.0)
Hemoglobin: 11.1 g/dL — ABNORMAL LOW (ref 13.2–17.1)
Lymphs Abs: 2139 cells/uL (ref 850–3900)
MCH: 33.7 pg — ABNORMAL HIGH (ref 27.0–33.0)
MCHC: 32.3 g/dL (ref 32.0–36.0)
MCV: 104.6 fL — ABNORMAL HIGH (ref 80.0–100.0)
MPV: 11.6 fL (ref 7.5–12.5)
Monocytes Relative: 7.4 %
Neutro Abs: 4359 cells/uL (ref 1500–7800)
Neutrophils Relative %: 58.9 %
Platelets: 198 10*3/uL (ref 140–400)
RBC: 3.29 10*6/uL — ABNORMAL LOW (ref 4.20–5.80)
RDW: 14.9 % (ref 11.0–15.0)
Total Lymphocyte: 28.9 %
WBC: 7.4 10*3/uL (ref 3.8–10.8)

## 2020-05-12 LAB — PSA: PSA: 0.1 ng/mL (ref <=4.0)

## 2020-05-12 MED ORDER — AZITHROMYCIN 250 MG PO TABS: ORAL_TABLET | ORAL | 0 refills | Status: DC

## 2020-05-12 NOTE — Patient Instructions (Signed)
Rest at home.  COVID-19 test taken today as well as respiratory virus panel with results pending.  Zithromax Z-PAK 2 p.o. day 1 followed by 1 p.o. days 2 through 5.

## 2020-05-12 NOTE — Progress Notes (Signed)
   Subjective:    Patient ID: [name], male    DOB: July 19, 1941, 79 y.o.   MRN: [id number]  HPI 79 year old Male with end-stage renal disease currently on dialysis came in today for lab appointment for upcoming physical exam on August 24.  While having his blood drawn, he mentioned that he had a respiratory infection.  He indicated he would like to have medication for the respiratory infection.  Therefore he is having an office visit today for evaluation of respiratory congestion.  Patient reports that he has had COVID-19 immunizations.  He is a dialysis patient and is followed by Nephrology.  No recent travel history.  No fever or shaking chills.  No dysgeusia.  Mild sputum production that is slightly discolored.  He told phlebotomist he had a respiratory infection.  He reports he has had 2 COVID-19 immunizations.  Due to the COVID-19 pandemic, we asked patient to be seen outside the office.  We are in protective equipment.  He is afebrile.  His wife is driving the car.  She reports no symptoms.  He is agreeable to visit in this format today.  Patient had Medicare wellness visit and health maintenance exam in July 2020.  He has a history of memory loss, essential hypertension, angiodysplasia of stomach, insulin-dependent diabetes, hypothyroidism and history of gout.  Diabetes is followed by Dr. Dwyane Dee, endocrinologist.  He also has a history of osteoporosis.  His nephrologist is Dr. Joelyn Oms at [facility].  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  He had bilateral bur holes placed by Dr. Ellene Route.  He went to rehab for a period of time.  He had fractured ribs.  This injury was related to a fall.  Issue with GI bleeding October 2019.  History of secondary hyperparathyroidism.  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  Social history: He is retired.  Wife works for the [employer], [employer].  1 adopted daughter.  Patient  is a non-smoker.  Social alcohol consumption.  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      Review of Systems see above-no fever chills dysgeusia myalgias     Objective:   Physical Exam Temperature 98.5 degrees  Skin warm and dry.  Neck is supple.  Chest is clear to auscultation without rales or wheezing.  He sounds nasally congested when he speaks.  Nasal swab obtained for COVID-19 and for respiratory virus panel.  He is not tachypneic.  No shortness of breath.  Respiratory rate is normal.  Cardiac exam regular rate and rhythm.       Assessment & Plan:  Acute upper respiratory infection  History of end-stage renal disease on dialysis  Essential hypertension  Diabetes mellitus  Osteoporosis  History of gout  Anemia of chronic disease  Plan: Rule out COVID-19 delta variant with nasal swab specimen.  Respiratory virus panel taken by nasal swab as well.  He will be started on Zithromax Z-PAK 2 p.o. day 1 followed by 1 p.o. days 2 through 5.  He is to rest at home and watch his temperature.  Call if symptoms worsen.  He has currently no acute respiratory distress.

## 2020-05-13 LAB — SARS-COV-2 RNA,(COVID-19) QUALITATIVE NAAT: SARS CoV2 RNA: NOT DETECTED

## 2020-05-14 DIAGNOSIS — Z992 Dependence on renal dialysis: Secondary | ICD-10-CM | POA: Diagnosis not present

## 2020-05-14 DIAGNOSIS — D509 Iron deficiency anemia, unspecified: Secondary | ICD-10-CM | POA: Diagnosis not present

## 2020-05-14 DIAGNOSIS — E119 Type 2 diabetes mellitus without complications: Secondary | ICD-10-CM | POA: Diagnosis not present

## 2020-05-14 DIAGNOSIS — N186 End stage renal disease: Secondary | ICD-10-CM | POA: Diagnosis not present

## 2020-05-14 DIAGNOSIS — E1129 Type 2 diabetes mellitus with other diabetic kidney complication: Secondary | ICD-10-CM | POA: Diagnosis not present

## 2020-05-16 DIAGNOSIS — E119 Type 2 diabetes mellitus without complications: Secondary | ICD-10-CM | POA: Diagnosis not present

## 2020-05-16 DIAGNOSIS — Z992 Dependence on renal dialysis: Secondary | ICD-10-CM | POA: Diagnosis not present

## 2020-05-16 DIAGNOSIS — N186 End stage renal disease: Secondary | ICD-10-CM | POA: Diagnosis not present

## 2020-05-16 DIAGNOSIS — E1129 Type 2 diabetes mellitus with other diabetic kidney complication: Secondary | ICD-10-CM | POA: Diagnosis not present

## 2020-05-16 DIAGNOSIS — D509 Iron deficiency anemia, unspecified: Secondary | ICD-10-CM | POA: Diagnosis not present

## 2020-05-16 LAB — RESPIRATORY VIRUS PANEL

## 2020-05-16 LAB — TEST AUTHORIZATION

## 2020-05-16 LAB — SARS-COV-2 RNA,(COVID-19) QUALITATIVE NAAT

## 2020-05-17 ENCOUNTER — Ambulatory Visit
Admission: RE | Admit: 2020-05-17 | Discharge: 2020-05-17 | Disposition: A | Discharge status: Home / Self Care | Payer: Medicare Other | Source: Ambulatory Visit | Attending: Internal Medicine | Admitting: Internal Medicine

## 2020-05-17 ENCOUNTER — Ambulatory Visit (INDEPENDENT_AMBULATORY_CARE_PROVIDER_SITE_OTHER): Payer: Medicare Other | Admitting: Internal Medicine

## 2020-05-17 ENCOUNTER — Other Ambulatory Visit: Payer: Self-pay

## 2020-05-17 VITALS — BP 140/60 | HR 66 | Temp 98.7°F | Ht 71.0 in | Wt 168.0 lb

## 2020-05-17 DIAGNOSIS — N186 End stage renal disease: Secondary | ICD-10-CM

## 2020-05-17 DIAGNOSIS — E538 Deficiency of other specified B group vitamins: Secondary | ICD-10-CM

## 2020-05-17 DIAGNOSIS — R05 Cough: Secondary | ICD-10-CM | POA: Diagnosis not present

## 2020-05-17 DIAGNOSIS — Z0000 Encounter for general adult medical examination without abnormal findings: Secondary | ICD-10-CM | POA: Diagnosis not present

## 2020-05-17 DIAGNOSIS — R54 Age-related physical debility: Secondary | ICD-10-CM

## 2020-05-17 DIAGNOSIS — E039 Hypothyroidism, unspecified: Secondary | ICD-10-CM

## 2020-05-17 DIAGNOSIS — Z87820 Personal history of traumatic brain injury: Secondary | ICD-10-CM

## 2020-05-17 DIAGNOSIS — I10 Essential (primary) hypertension: Secondary | ICD-10-CM

## 2020-05-17 DIAGNOSIS — Z8739 Personal history of other diseases of the musculoskeletal system and connective tissue: Secondary | ICD-10-CM

## 2020-05-17 DIAGNOSIS — Z9181 History of falling: Secondary | ICD-10-CM

## 2020-05-17 DIAGNOSIS — R059 Cough, unspecified: Secondary | ICD-10-CM

## 2020-05-17 DIAGNOSIS — R413 Other amnesia: Secondary | ICD-10-CM

## 2020-05-17 DIAGNOSIS — Z992 Dependence on renal dialysis: Secondary | ICD-10-CM

## 2020-05-17 DIAGNOSIS — E119 Type 2 diabetes mellitus without complications: Secondary | ICD-10-CM

## 2020-05-17 NOTE — Progress Notes (Signed)
Subjective:    Patient ID: [name], male    DOB: 1941/03/15, 79 y.o.   MRN: [id number]  HPI 79 year old Male seen for annual Medicare wellness visit, health maintenance exam and evaluation of medical issues.  He has a history of chronic kidney disease and is a dialysis patient.  He has insulin-dependent diabetes mellitus and is followed by Dr. Dwyane Dee, endocrinologist.  He has mild memory loss treated with Aricept.  He takes Prolia for osteoporosis.  He has chronic dermatitis treated with Temovate.  Sometimes takes Vistaril for chronic itching.  History of hypothyroidism treated with thyroid replacement medication.  He is followed closely by Dr. Joelyn Oms at [facility].  He has a history of kyphoplasty L3 and L5 in the remote past secondary to a fall.  He had endplate disruption of L1 and L2 without frank compression in April 2019 due to a fall.  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  History of gout, hyperlipidemia, hypothyroidism and allergic rhinitis.  History of peripheral vascular disease.  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  He had bilateral bur holes placed by Dr. Ellene Route and went to rehab for period of time.  He also had fractured ribs.  This was related to a fall.  Issue with GI bleeding early October 2019.  His health is very fragile.  He has history of B12 deficiency and is supposed to get B12 injections monthly but I am not sure he is compliant with that.  History of herpes zoster 2007.  Cardiolite study done by Dr. Pernell Dupre in 2003 was negative.  History of small bowel obstruction treated with surgical lysis of adhesions in 1996.  Sometimes gets diverticulitis in response to Cipro and Flagyl.  Social history: He is retired from [employer].  Used to work part-time at the [employer].  He is married.  Wife works for the [employer].   1 adopted daughter.  Non-smoker.  Social alcohol consumption.  Family history: Father died of congestive heart failure at age 87.  Mother died at age 50 of a brain aneurysm.  He is an only child.  He was seen here August 19 with a cough.  Covid test was negative.  He was treated with Zithromax Z-PAK.  He is still coughing and has not improved.  Chest x-ray ordered today shows lungs to be well aerated bilaterally and no infiltrate or effusion.  Azithromycin should still be in his system.  See if symptoms improve.  Review of Systems  Respiratory: Negative.   Cardiovascular: Negative.   Gastrointestinal: Negative.   Neurological:       Mild memory loss       Objective:   Physical Exam Blood pressure 140/60 pulse 66 temperature 98.7 pulse oximetry 97% weight 168 pounds height 5 feet 11 inches BMI 23.43 he is a bit unsteady on his feet and uses a walker.  Skin warm and dry.  No cervical adenopathy.  PERRLA.  No facial weakness.  Chest clear.  Cardiac exam regular rate and rhythm.  Abdomen without hepatosplenomegaly masses or tenderness.  Well-healed surgical scars from surgery years ago.  No lower extremity pitting edema.  Neuro no gross focal deficits but his memory is not good.  He is alert cooperative.       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 Dwyane Dee.  Patient is on insulin.  History of B12 deficiency.  Secondary hyperparathyroidism.  Essential hypertension  History of lumbar compression fracture secondary to fall  Plan: He will continue with current medications and follow-up here in 6 months.  He is to have annual flu vaccine and Covid booster when available.  Subjective:     Patient presents for Medicare Annual/Subsequent preventive examination.  Review Past Medical/Family/Social: See above   Risk Factors  Current exercise habits: Unable to exercise Dietary issues discussed: Low-fat low carbohydrate advised  Cardiac risk factors: Hyperlipidemia  Depression Screen  (Note: if answer to either of the following is "Yes", a more complete depression screening is indicated)   Over the past two weeks, have you felt down, depressed or hopeless? No  Over the past two weeks, have you felt little interest or pleasure in doing things? No Have you lost interest or pleasure in daily life? No Do you often feel hopeless? No Do you cry easily over simple problems? No   Activities of Daily Living  In your present state of health, do you have any difficulty performing the following activities?:   Driving? No longer drives Managing money? No  Feeding yourself? No  Getting from bed to chair?  Yes Climbing a flight of stairs?  Yes Preparing food and eating?:  Yes Bathing or showering?  He has Getting dressed: Yes Getting to the toilet? No  Using the toilet:No  Moving around from place to place: He has difficulty ambulating In the past year have you fallen or had a near fall?:No  Are you sexually active? No  Do you have more than one partner? No   Hearing Difficulties: No  Do you often ask people to speak up or repeat themselves? No  Do you experience ringing or noises in your ears? No  Do you have difficulty understanding soft or whispered voices? No  Do you feel that you have a problem with memory?  He has Do you often misplace items?  Yes   Home Safety:  Do you have a smoke alarm at your residence? Yes Do you have grab bars in the bathroom?  Yes Do you have throw rugs in your house?  Yes   Cognitive Testing  Alert? Yes Normal Appearance?Yes  Oriented to person? Yes Place? Yes  Time? -No Recall of three objects?  No Can perform simple calculations?   Is a Displays appropriate judgment?Yes  Can read the correct time from a watch face?Yes   List the Names of Other Physician/Practitioners you currently use:  See referral list for the physicians patient is currently seeing.  Dr. Cristal Generous  Dr. Dwyane Dee endocrinologist  Neurologist   Review of Systems: See above   Objective:     General appearance: Appears stated age and frail Head: Normocephalic, without obvious abnormality, atraumatic  Eyes: conj clear, EOMi PEERLA  Ears: normal TM's and external ear canals both ears  Nose: Nares normal. Septum midline. Mucosa normal. No drainage or sinus tenderness.  Throat: lips, mucosa, and tongue normal; teeth and gums normal  Neck: no adenopathy, no carotid bruit, no JVD, supple, symmetrical, trachea midline and thyroid not enlarged, symmetric, no tenderness/mass/nodules  No CVA tenderness.  Lungs: clear to auscultation bilaterally  Breasts: normal male Heart: regular rate  and rhythm, S1, S2 normal, no murmur, click, rub or gallop  Abdomen: soft, non-tender; bowel sounds normal; no masses, no organomegaly  Musculoskeletal: ROM normal in all joints, no crepitus, no deformity, Normal muscle strengthen. Back  is symmetric, no curvature. Skin: Skin color, texture, turgor normal. No rashes or lesions  Lymph nodes: Cervical, supraclavicular, and axillary nodes normal.  Neurologic: CN 2 -12 Normal, Normal symmetric reflexes. Normal coordination and gait  Psych: Alert-not clear about day of week- mood appear stable.    Assessment:    Annual wellness medicare exam   Plan:    During the course of the visit the patient was educated and counseled about appropriate screening and preventive services including:  Have Covid booster when available  Have annual flu vaccine      Patient Instructions (the written plan) was given to the patient.  Medicare Attestation  I have personally reviewed:  The patient's medical and social  history  Their use of alcohol, tobacco or illicit drugs  Their current medications and supplements  The patient's functional ability including ADLs,fall risks, home safety risks, cognitive, and hearing and visual impairment  Diet and physical activities  Evidence for depression or mood disorders  The patient's weight, height, BMI, and visual acuity have been recorded in the chart. I have made referrals, counseling, and provided education to the patient based on review of the above and I have provided the patient with a written personalized care plan for preventive services.

## 2020-05-18 DIAGNOSIS — E119 Type 2 diabetes mellitus without complications: Secondary | ICD-10-CM | POA: Diagnosis not present

## 2020-05-18 DIAGNOSIS — D509 Iron deficiency anemia, unspecified: Secondary | ICD-10-CM | POA: Diagnosis not present

## 2020-05-18 DIAGNOSIS — E1129 Type 2 diabetes mellitus with other diabetic kidney complication: Secondary | ICD-10-CM | POA: Diagnosis not present

## 2020-05-18 DIAGNOSIS — N186 End stage renal disease: Secondary | ICD-10-CM | POA: Diagnosis not present

## 2020-05-18 DIAGNOSIS — Z992 Dependence on renal dialysis: Secondary | ICD-10-CM | POA: Diagnosis not present

## 2020-05-19 ENCOUNTER — Ambulatory Visit (INDEPENDENT_AMBULATORY_CARE_PROVIDER_SITE_OTHER): Payer: Medicare Other

## 2020-05-19 ENCOUNTER — Other Ambulatory Visit: Payer: Self-pay

## 2020-05-19 DIAGNOSIS — M810 Age-related osteoporosis without current pathological fracture: Secondary | ICD-10-CM | POA: Diagnosis not present

## 2020-05-19 MED ORDER — DENOSUMAB 60 MG/ML ~~LOC~~ SOSY
60.0000 mg | PREFILLED_SYRINGE | Freq: Once | SUBCUTANEOUS | Status: AC
  Administered 2020-05-19: 60 mg via SUBCUTANEOUS

## 2020-05-19 NOTE — Progress Notes (Addendum)
60 mg of Prolia given subcutaneously today by N.Jachin Coury,LPN. Patient tolerated injection well.

## 2020-05-20 DIAGNOSIS — Z992 Dependence on renal dialysis: Secondary | ICD-10-CM | POA: Diagnosis not present

## 2020-05-20 DIAGNOSIS — D509 Iron deficiency anemia, unspecified: Secondary | ICD-10-CM | POA: Diagnosis not present

## 2020-05-20 DIAGNOSIS — N186 End stage renal disease: Secondary | ICD-10-CM | POA: Diagnosis not present

## 2020-05-20 DIAGNOSIS — E119 Type 2 diabetes mellitus without complications: Secondary | ICD-10-CM | POA: Diagnosis not present

## 2020-05-20 DIAGNOSIS — E1129 Type 2 diabetes mellitus with other diabetic kidney complication: Secondary | ICD-10-CM | POA: Diagnosis not present

## 2020-05-23 DIAGNOSIS — E119 Type 2 diabetes mellitus without complications: Secondary | ICD-10-CM | POA: Diagnosis not present

## 2020-05-23 DIAGNOSIS — N186 End stage renal disease: Secondary | ICD-10-CM | POA: Diagnosis not present

## 2020-05-23 DIAGNOSIS — D509 Iron deficiency anemia, unspecified: Secondary | ICD-10-CM | POA: Diagnosis not present

## 2020-05-23 DIAGNOSIS — E1129 Type 2 diabetes mellitus with other diabetic kidney complication: Secondary | ICD-10-CM | POA: Diagnosis not present

## 2020-05-23 DIAGNOSIS — Z992 Dependence on renal dialysis: Secondary | ICD-10-CM | POA: Diagnosis not present

## 2020-05-24 ENCOUNTER — Encounter: Payer: Self-pay | Admitting: Endocrinology

## 2020-05-25 DIAGNOSIS — Z992 Dependence on renal dialysis: Secondary | ICD-10-CM | POA: Diagnosis not present

## 2020-05-25 DIAGNOSIS — E1129 Type 2 diabetes mellitus with other diabetic kidney complication: Secondary | ICD-10-CM | POA: Diagnosis not present

## 2020-05-25 DIAGNOSIS — D509 Iron deficiency anemia, unspecified: Secondary | ICD-10-CM | POA: Diagnosis not present

## 2020-05-25 DIAGNOSIS — E119 Type 2 diabetes mellitus without complications: Secondary | ICD-10-CM | POA: Diagnosis not present

## 2020-05-25 DIAGNOSIS — D631 Anemia in chronic kidney disease: Secondary | ICD-10-CM | POA: Diagnosis not present

## 2020-05-25 DIAGNOSIS — N2581 Secondary hyperparathyroidism of renal origin: Secondary | ICD-10-CM | POA: Diagnosis not present

## 2020-05-25 DIAGNOSIS — N186 End stage renal disease: Secondary | ICD-10-CM | POA: Diagnosis not present

## 2020-05-25 DIAGNOSIS — E1122 Type 2 diabetes mellitus with diabetic chronic kidney disease: Secondary | ICD-10-CM | POA: Diagnosis not present

## 2020-05-26 ENCOUNTER — Emergency Department (HOSPITAL_COMMUNITY): Payer: Medicare Other

## 2020-05-26 ENCOUNTER — Telehealth: Payer: Self-pay | Admitting: Internal Medicine

## 2020-05-26 ENCOUNTER — Other Ambulatory Visit: Payer: Self-pay

## 2020-05-26 ENCOUNTER — Encounter (HOSPITAL_COMMUNITY): Payer: Self-pay

## 2020-05-26 ENCOUNTER — Emergency Department (HOSPITAL_COMMUNITY)
Admission: EM | Admit: 2020-05-26 | Discharge: 2020-05-26 | Disposition: A | Discharge status: Home / Self Care | Payer: Medicare Other | Attending: Emergency Medicine | Admitting: Emergency Medicine

## 2020-05-26 ENCOUNTER — Inpatient Hospital Stay: Admission: RE | Admit: 2020-05-26 | Payer: Medicare Other | Source: Ambulatory Visit

## 2020-05-26 DIAGNOSIS — R55 Syncope and collapse: Secondary | ICD-10-CM | POA: Diagnosis not present

## 2020-05-26 DIAGNOSIS — E039 Hypothyroidism, unspecified: Secondary | ICD-10-CM | POA: Diagnosis not present

## 2020-05-26 DIAGNOSIS — I959 Hypotension, unspecified: Secondary | ICD-10-CM | POA: Diagnosis not present

## 2020-05-26 DIAGNOSIS — J9811 Atelectasis: Secondary | ICD-10-CM | POA: Diagnosis not present

## 2020-05-26 DIAGNOSIS — R0602 Shortness of breath: Secondary | ICD-10-CM | POA: Diagnosis not present

## 2020-05-26 DIAGNOSIS — I2510 Atherosclerotic heart disease of native coronary artery without angina pectoris: Secondary | ICD-10-CM | POA: Insufficient documentation

## 2020-05-26 DIAGNOSIS — N186 End stage renal disease: Secondary | ICD-10-CM | POA: Diagnosis not present

## 2020-05-26 DIAGNOSIS — Z79890 Hormone replacement therapy (postmenopausal): Secondary | ICD-10-CM | POA: Diagnosis not present

## 2020-05-26 DIAGNOSIS — G4489 Other headache syndrome: Secondary | ICD-10-CM | POA: Diagnosis not present

## 2020-05-26 DIAGNOSIS — I120 Hypertensive chronic kidney disease with stage 5 chronic kidney disease or end stage renal disease: Secondary | ICD-10-CM | POA: Diagnosis not present

## 2020-05-26 DIAGNOSIS — R110 Nausea: Secondary | ICD-10-CM | POA: Diagnosis not present

## 2020-05-26 DIAGNOSIS — E1122 Type 2 diabetes mellitus with diabetic chronic kidney disease: Secondary | ICD-10-CM | POA: Insufficient documentation

## 2020-05-26 DIAGNOSIS — R410 Disorientation, unspecified: Secondary | ICD-10-CM | POA: Diagnosis not present

## 2020-05-26 DIAGNOSIS — Z794 Long term (current) use of insulin: Secondary | ICD-10-CM | POA: Diagnosis not present

## 2020-05-26 DIAGNOSIS — R079 Chest pain, unspecified: Secondary | ICD-10-CM | POA: Diagnosis not present

## 2020-05-26 DIAGNOSIS — R4182 Altered mental status, unspecified: Secondary | ICD-10-CM | POA: Diagnosis not present

## 2020-05-26 DIAGNOSIS — Z87891 Personal history of nicotine dependence: Secondary | ICD-10-CM | POA: Diagnosis not present

## 2020-05-26 DIAGNOSIS — R05 Cough: Secondary | ICD-10-CM | POA: Diagnosis not present

## 2020-05-26 LAB — CBC WITH DIFFERENTIAL/PLATELET
Abs Immature Granulocytes: 0.02 10*3/uL (ref 0.00–0.07)
Basophils Absolute: 0.1 10*3/uL (ref 0.0–0.1)
Basophils Relative: 1 %
Eosinophils Absolute: 0.3 10*3/uL (ref 0.0–0.5)
Eosinophils Relative: 4 %
HCT: 30.1 % — ABNORMAL LOW (ref 39.0–52.0)
Hemoglobin: 9.4 g/dL — ABNORMAL LOW (ref 13.0–17.0)
Immature Granulocytes: 0 %
Lymphocytes Relative: 19 %
Lymphs Abs: 1.3 10*3/uL (ref 0.7–4.0)
MCH: 33.3 pg (ref 26.0–34.0)
MCHC: 31.2 g/dL (ref 30.0–36.0)
MCV: 106.7 fL — ABNORMAL HIGH (ref 80.0–100.0)
Monocytes Absolute: 0.6 10*3/uL (ref 0.1–1.0)
Monocytes Relative: 8 %
Neutro Abs: 4.6 10*3/uL (ref 1.7–7.7)
Neutrophils Relative %: 68 %
Platelets: 172 10*3/uL (ref 150–400)
RBC: 2.82 MIL/uL — ABNORMAL LOW (ref 4.22–5.81)
RDW: 15.7 % — ABNORMAL HIGH (ref 11.5–15.5)
WBC: 6.9 10*3/uL (ref 4.0–10.5)
nRBC: 0 % (ref 0.0–0.2)

## 2020-05-26 LAB — COMPREHENSIVE METABOLIC PANEL
ALT: 12 U/L (ref 0–44)
AST: 21 U/L (ref 15–41)
Albumin: 2.9 g/dL — ABNORMAL LOW (ref 3.5–5.0)
Alkaline Phosphatase: 106 U/L (ref 38–126)
Anion gap: 12 (ref 5–15)
BUN: 26 mg/dL — ABNORMAL HIGH (ref 8–23)
CO2: 32 mmol/L (ref 22–32)
Calcium: 6.5 mg/dL — ABNORMAL LOW (ref 8.9–10.3)
Chloride: 98 mmol/L (ref 98–111)
Creatinine, Ser: 6.54 mg/dL — ABNORMAL HIGH (ref 0.61–1.24)
GFR calc Af Amer: 9 mL/min — ABNORMAL LOW (ref >60)
GFR calc non Af Amer: 7 mL/min — ABNORMAL LOW (ref >60)
Glucose, Bld: 122 mg/dL — ABNORMAL HIGH (ref 70–99)
Potassium: 3.1 mmol/L — ABNORMAL LOW (ref 3.5–5.1)
Sodium: 142 mmol/L (ref 135–145)
Total Bilirubin: 0.5 mg/dL (ref 0.3–1.2)
Total Protein: 6.1 g/dL — ABNORMAL LOW (ref 6.5–8.1)

## 2020-05-26 LAB — TROPONIN I (HIGH SENSITIVITY): Troponin I (High Sensitivity): 16 ng/L (ref <18)

## 2020-05-26 LAB — CBG MONITORING, ED
Glucose-Capillary: 120 mg/dL — ABNORMAL HIGH (ref 70–99)
Glucose-Capillary: 100 mg/dL — ABNORMAL HIGH (ref 70–99)

## 2020-05-26 MED ORDER — POTASSIUM CHLORIDE CRYS ER 20 MEQ PO TBCR
40.0000 meq | EXTENDED_RELEASE_TABLET | Freq: Once | ORAL | Status: AC
  Administered 2020-05-26: 40 meq via ORAL
  Filled 2020-05-26: qty 2

## 2020-05-26 NOTE — ED Provider Notes (Signed)
Medical screening examination/treatment/procedure(s) were conducted as a shared visit with non-physician practitioner(s) and myself.  I personally evaluated the patient during the encounter.  EKG Interpretation  Date/Time:  Thursday May 26 2020 13:32:44 EDT Ventricular Rate:  73 PR Interval:    QRS Duration: 89 QT Interval:  478 QTC Calculation: 527 R Axis:   -31 Text Interpretation: Ectopic atrial rhythm Left axis deviation Probable anteroseptal infarct, old Prolonged QT interval No significant change was found Confirmed by Gerlene Fee [phone]) on 05/26/2020 5:06:55 PM   Has extensive past medical history including chronic bilateral frontal subdural hematomas, insulin-dependent diabetes and ESRD on dialysis.  Patient has had generalized weakness waxing and waning for several weeks.  He has recently been treated with a Z-Pak for URI symptoms and cough.  He reports cough and URI symptoms much better but continues to have weakness and wife noted confusion today.  Patient seem to fairly quickly become nauseated and sweaty and mildly confused.  No focal neurologic problems were identified.  Dominant symptoms were dizziness nausea and generalized weakness.  Patient is alert and interactive.  Clinically he is well in appearance.  Speech is situationally oriented and recall for events seems to be pretty well intact.  Heart is regular.  No gross rub murmur gallop.  No respiratory distress.  Lungs are grossly clear.  Abdomen is soft and nondistended.  No tenderness to palpation.  Motor strength 5\5x4 extremities.  Patient presents with generalized symptoms which at this point are resolved.  Diagnostic evaluation is stable without any acute findings on CT head, lab results, chest x-ray or urinalysis.  Patient recently treated for URI type symptoms but improved.  Return precautions reviewed.   Charlesetta Shanks, MD 05/29/20 [phone]

## 2020-05-26 NOTE — Telephone Encounter (Signed)
Cancel bone density.  Have her Call ambulance to take him to ED

## 2020-05-26 NOTE — ED Provider Notes (Signed)
[facility] EMERGENCY DEPARTMENT Provider Note   CSN: [id number] Arrival date & time: 05/26/20  1324     History Chief Complaint  Patient presents with  . Altered Mental Status    [name] is a 79 y.o. 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  Patient reports that for the past couple of weeks, he has been feeling intermittently weak.  He endorses having mild rhinorrhea, congestion, and cough.  He discussed this with his primary care provider and had been prescribed a Z-Pak, with good relief of symptoms.  History was obtained from EMS and they report that he was sitting in the recliner at home when he became abruptly nauseated, diaphoretic, and altered.  Patient recalls this episode and states that he was feeling short of breath and as though he may pass out.  However, he never lost consciousness.  He states that this sometimes will have been "because he is old".  Patient denies any symptoms on my examination.  Specifically, denies any chest pain, difficulty breathing, nausea, lightheadedness or dizziness, headache, vision changes, numbness or weakness, or other focal neurologic deficits.  He states that he has been going to his dialysis regularly and taking his medications, as directed.  Spoke to wife and she says that he became disoriented after waking this morning. LKN last evening. He told caregiver he simply "did not feel good", felt profoundly dizzy, and could not stand up.  Patient was also endorsing nausea symptoms and felt as though he may pass out.  He was confused and did not know where he was which she has seen become progressively worse over the last month.  However, she feels as though his confusion and altered mental status has accelerated over the course of the past few days.  She also tells me that he has  been complaining of  progressively worsening headache symptoms for the past ten days.  Patient is fully immunized for COVID-19.    While patient appears to be answering questions appropriately and knows that he is at [facility] in [place], he believes the year to be 55.  Level 5 caveat due to dementia.   HPI     Past Medical History:  Diagnosis Date  . Allergy   . Anemia   . Arthritis   . Cataract    bil cateracts removed  . Coronary artery disease   . Dementia arising in the senium and presenium ([place])   . Diabetes mellitus    Type 2  . Diverticulitis   . ED (erectile dysfunction)   . Elevated homocysteine   . ESRD (end stage renal disease) on dialysis ([place]) 03/2015  . GERD (gastroesophageal reflux disease)    pepto   . Gout   . Hiatal hernia   . Hyperlipidemia   . Hypertension   . Hypothyroidism   . Pneumonia   . PVD (peripheral vascular disease) ([place])    has plastic aorta  . Seasonal allergies   . Sleep apnea    does not wear c-pap    Patient Active Problem List   Diagnosis Date Noted  . Rectal bleeding 04/22/2020  . AVM (arteriovenous malformation) of small bowel, acquired   . Hiatal hernia   . Schatzki's ring of distal esophagus   . Melena 11/19/2018  . Leukocytosis   . Anemia   .  TBI (traumatic brain injury) ([place]) 07/29/2018  . 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 Loose stools   . Dementia arising in the senium and presenium ([place])   . 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 Multiple fractures of ribs, left side, initial encounter for closed fracture 07/21/2018  . Subdural hematoma ([place]) 07/21/2018  . Angiodysplasia of stomach   . Occult GI bleeding   . GIB (gastrointestinal bleeding) 06/01/2018  . Low back pain 01/17/2018  . Anorexia   . Dyspnea   . Macrocytic anemia 06/04/2016  . Thrombocytopenia ([place]) 06/04/2016  . Exertional dyspnea 06/04/2016  . Arm paresthesia, left 06/04/2016  . Dyspnea on  exertion 06/04/2016  . Tenderness of right calf 06/04/2016  . Mild cognitive impairment with memory loss 01/12/2016  . ESRD on dialysis ([place]) 05/17/2015  . Hypoglycemia   . Weakness 02/18/2015  . OSA (obstructive sleep apnea) 02/02/2015  . Hyperkalemia 10/05/2014  . End stage renal disease ([place]) 11/18/2013  . BPH (benign prostatic hyperplasia) 11/22/2012  . Peripheral vascular disease ([place]) 12/24/2011  . Hyperparathyroidism ([place]) 12/24/2011  . Hypothyroidism 12/24/2011  . Allergic rhinitis 12/24/2011  . Erectile dysfunction 12/24/2011  . Insulin dependent diabetes mellitus 09/03/2008  . Hyperlipidemia 09/03/2008  . Essential hypertension 08/30/2008  . PANCREATITIS, HX OF 08/30/2008  . RENAL FAILURE, ACUTE, HX OF 08/30/2008  . DIVERTICULOSIS, COLON 06/08/2003    Past Surgical History:  Procedure Laterality Date  . aortobifemoral bypass    . AV FISTULA PLACEMENT Left 12/01/2013   Procedure: ARTERIOVENOUS (AV) FISTULA CREATION- LEFT BRACHIOCEPHALIC;  Surgeon: Angelia Mould, MD;  Location: [facility];  Service: Vascular;  Laterality: Left;  . [place] TRANSPOSITION Right 07/27/2014   Procedure: BASCILIC VEIN TRANSPOSITION;  Surgeon: Angelia Mould, MD;  Location: [place] [facility];  Service: Vascular;  Laterality: Right;  . BRAIN SURGERY  07/22/2018  . BREAST SURGERY     left - granulomatous mastitis  . BURR HOLE Bilateral 07/22/2018   Procedure: BILATERAL BURR HOLES;  Surgeon: Kristeen Miss, MD;  Location: [facility];  Service: Neurosurgery;  Laterality: Bilateral;  . COLONOSCOPY    . 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 ENTEROSCOPY N/A 06/03/2018   Procedure: ENTEROSCOPY;  Surgeon: Lavena Bullion, DO;  Location: [facility];  Service: Gastroenterology;  Laterality: N/A;  . ENTEROSCOPY N/A 11/20/2018   Procedure: ENTEROSCOPY;  Surgeon: Rush Landmark Telford Nab., MD;  Location: [facility] ENDOSCOPY;  Service: Gastroenterology;  Laterality: N/A;  . ENTEROSCOPY N/A 12/03/2019    Procedure: ENTEROSCOPY;  Surgeon: Lavena Bullion, DO;  Location: [facility] ENDOSCOPY;  Service: Gastroenterology;  Laterality: N/A;  push enteroscopy  . EYE SURGERY Bilateral    cataracts  . HEMODIALYSIS INPATIENT  01/17/2018      . HOT HEMOSTASIS N/A 06/03/2018   Procedure: HOT HEMOSTASIS (ARGON PLASMA COAGULATION/BICAP);  Surgeon: Lavena Bullion, DO;  Location: [facility] ENDOSCOPY;  Service: Gastroenterology;  Laterality: N/A;  . HOT HEMOSTASIS N/A 11/20/2018   Procedure: HOT HEMOSTASIS (ARGON PLASMA COAGULATION/BICAP);  Surgeon: Irving Copas., MD;  Location: [place] [facility];  Service: Gastroenterology;  Laterality: N/A;  . HOT HEMOSTASIS N/A 12/03/2019   Procedure: HOT HEMOSTASIS (ARGON PLASMA COAGULATION/BICAP);  Surgeon: Lavena Bullion, DO;  Location: [facility] ENDOSCOPY;  Service: Gastroenterology;  Laterality: N/A;  . REVISION OF ARTERIOVENOUS GORETEX GRAFT Right 09/01/2019   Procedure: REVISION OF ARTERIOVENOUS FISTULA RIGHT ARM;  Surgeon: Serafina Mitchell, MD;  Location: [facility];  Service: Vascular;  Laterality: Right;  . REVISON OF ARTERIOVENOUS FISTULA Left 02/09/2014   Procedure: REVISON OF LEFT ARTERIOVENOUS FISTULA - RESECTION OF RENDUNDANT VEIN;  Surgeon: Angelia Mould, MD;  Location: [facility];  Service: Vascular;  Laterality: Left;  . SBO with lysis adhesions    . SHUNTOGRAM Left 04/19/2014   Procedure: FISTULOGRAM;  Surgeon: Angelia Mould, MD;  Location: [facility] CATH LAB;  Service: Cardiovascular;  Laterality: Left;  . UNILATERAL UPPER EXTREMEITY ANGIOGRAM N/A 07/12/2014   Procedure: UNILATERAL UPPER Anselmo Rod;  Surgeon: Angelia Mould, MD;  Location: [facility] CATH LAB;  Service: Cardiovascular;  Laterality: N/A;       Family History  Problem Relation Age of Onset  . Aneurysm Mother        brain  . Heart disease Father   . Stroke Father   . Hypertension Father   . Diabetes Father   . Dementia Neg Hx   . Colon cancer Neg Hx   . Esophageal cancer Neg Hx   .  Pancreatic cancer Neg Hx   . Prostate cancer Neg Hx   . Rectal cancer Neg Hx   . Stomach cancer Neg Hx     Social History   Tobacco Use  . Smoking status: Former Smoker    Quit date: 09/24/1994    Years since quitting: 25.6  . Smokeless tobacco: Never Used  Vaping Use  . Vaping Use: Never used  Substance Use Topics  . Alcohol use: No    Alcohol/week: 0.0 standard drinks    Comment: Quit Oct. 1977 ("somewhat heavy")  . Drug use: No    Home Medications Prior to Admission medications   Medication Sig Start Date End Date Taking? Authorizing Provider  acetaminophen (TYLENOL) 325 MG tablet Take 1-2 tablets (325-650 mg total) by mouth every 4 (four) hours as needed for mild pain. Patient taking differently: Take 650 mg by mouth in the morning and at bedtime.  08/01/18   Love, Ivan Anchors, PA-C  allopurinol (ZYLOPRIM) 100 MG tablet Take 100 mg by mouth daily.     [provider]  azithromycin (ZITHROMAX Z-PAK) 250 MG tablet Take 2 tablets (500 mg) on  Day 1,  followed by 1 tablet (250 mg) once daily on Days 2 through 5. 05/12/20   Baxley, Cresenciano Lick, MD  Blood Glucose Monitoring Suppl (FREESTYLE FREEDOM LITE) w/Device KIT Use to check blood sugar 2 times per day dx code E11.65 10/21/18   Elayne Snare, MD  camphor-menthol [facility]) lotion Apply 1 application topically 4 (four) times daily as needed for itching.     [provider]  Carboxymethylcellulose Sod PF 0.25 % SOLN Place 1 drop into both eyes 4 (four) times daily as needed (dry/irritated eyes.).     [provider]  cetirizine (ZYRTEC) 10 MG chewable tablet Chew 10 mg by mouth daily as needed (allergies.).     [provider]  clobetasol (TEMOVATE) 0.05 % external solution Apply 1 application topically 2 (two) times daily as needed (scalp irritation.).  04/19/15   [provider]  denosumab (PROLIA) 60 MG/ML SOSY injection Inject 60 mg into the skin every 6 (six) months.    [provider]    donepezil (ARICEPT) 10 MG tablet Take 10 mg by mouth at bedtime.  01/13/19   [provider]  FLUOCINOLONE ACETONIDE SCALP 0.01 % OIL Apply 1 application topically daily as needed (scalp irritation.).  04/28/15   [provider]  fluticasone (CUTIVATE) 0.05 % cream Apply 1 application  topically 2 (two) times daily as needed (irritation). Apply to face Patient not taking: Reported on 05/17/2020 04/19/15   [provider]  fluticasone (FLONASE) 50 MCG/ACT nasal spray Place 1 spray into both nostrils daily. Patient taking differently: Place 1 spray into both nostrils daily as needed for allergies.  10/08/14   Mikhail, Velta Addison, DO  glucose blood (FREESTYLE LITE) test strip USE THREE TIMES DAILY 01/25/20   Elayne Snare, MD  hydrocortisone (ANUSOL-HC) 25 MG suppository Place 1 suppository (25 mg total) rectally at bedtime. Patient not taking: Reported on 05/17/2020 04/14/20   Zehr, Laban Emperor, PA-C  hydrocortisone 2.5 % cream Apply 1 application topically 3 (three) times daily as needed (skin irritation (legs & arms)).  01/13/19   [provider]  insulin aspart (NOVOLOG FLEXPEN) 100 UNIT/ML FlexPen INJECT 6-8 UNITS UNDER THE SKIN THREE TIMES DAILY BEFORE MEALS. DX:E11.65 Patient taking differently: Inject 6-8 Units into the skin 3 (three) times daily with meals. Sliding scale insulin 11/25/18   Elayne Snare, MD  Insulin Pen Needle (PEN NEEDLES) 32G X 4 MM MISC 1 each by Does not apply route 3 (three) times daily. USE PEN NEEDLES TO INJECT INSULIN THREE TIMES DAILY. 11/25/18   Elayne Snare, MD  ketoconazole (NIZORAL) 2 % shampoo Apply 1 application topically 2 (two) times a week. Patient taking differently: Apply 1 application topically daily as needed for irritation.  03/31/15   Elby Showers, MD  Lancets (FREESTYLE) lancets Use as instructed to check blood sugar 2 times per day dx code E11.65 02/22/16   Elayne Snare, MD  levothyroxine (SYNTHROID, LEVOTHROID) 50 MCG tablet Take 1 tablet  (50 mcg total) by mouth daily. Patient taking differently: Take 50 mcg by mouth daily before breakfast.  08/06/14   Elby Showers, MD  lidocaine (LIDODERM) 5 % Place 1 patch onto the skin daily.  11/12/19   [provider]  memantine (NAMENDA) 5 MG tablet Take 1 tablet (5 mg total) by mouth 2 (two) times daily. 07/30/19   Melvenia Beam, MD  midodrine (PROAMATINE) 10 MG tablet Take one pill prior to hemodialysis on MWF Patient taking differently: Take 10 mg by mouth every Monday, Wednesday, and Friday with hemodialysis.  08/13/18   Love, Ivan Anchors, PA-C  Naftifine HCl (NAFTIN) 2 % CREA Apply to feet daily for fungal infection Patient taking differently: Apply 1 application topically daily as needed (for fungal infection). Apply to feet 09/12/15   Baxley, Cresenciano Lick, MD  Nutritional Supplements (FEEDING SUPPLEMENT, NEPRO CARB STEADY,) LIQD Take 237 mLs by mouth in the morning and at bedtime.     [provider]  omeprazole (PRILOSEC) 40 MG capsule Take 1 capsule (40 mg total) by mouth daily. Patient taking differently: Take 40 mg by mouth daily before breakfast.  03/13/18   Esterwood, Amy S, PA-C  sevelamer carbonate (RENVELA) 800 MG tablet Take 1,200-2,400 mg by mouth See admin instructions. Take 3 tablets (2400 mg) by mouth twice daily with a meal & take 2 tablet (1600 mg) by mouth with a snack    [provider]  simvastatin (ZOCOR) 20 MG tablet Take 20 mg by mouth at bedtime.     [provider]  TRADJENTA 5 MG TABS tablet TAKE 1 TABLET DAILY 03/01/20   Elayne Snare, MD    Allergies    Ambien [zolpidem tartrate] and Penicillins  Review of Systems   Review of Systems  All other systems reviewed and are negative.   Physical Exam Updated Vital  Signs BP 106/62 (BP Location: Right Leg)   Pulse 72   Temp 97.8 F (36.6 C) (Oral)   Resp 19   Ht 5' 11"  (1.803 m)   Wt 78.9 kg   SpO2 98%   BMI 24.26 kg/m   Physical Exam Vitals and nursing note reviewed. Exam  conducted with a chaperone present.  Constitutional:      Appearance: Normal appearance. He is not ill-appearing.  HENT:     Head: Normocephalic and atraumatic.  Eyes:     General: No scleral icterus.    Conjunctiva/sclera: Conjunctivae normal.  Cardiovascular:     Rate and Rhythm: Normal rate and regular rhythm.     Pulses: Normal pulses.     Heart sounds: Normal heart sounds.  Pulmonary:     Effort: Pulmonary effort is normal. No respiratory distress.     Breath sounds: Normal breath sounds. No wheezing or rales.  Musculoskeletal:     Cervical back: Normal range of motion. No rigidity.  Skin:    General: Skin is dry.     Capillary Refill: Capillary refill takes less than 2 seconds.  Neurological:     Mental Status: He is alert.     GCS: GCS eye subscore is 4. GCS verbal subscore is 5. GCS motor subscore is 6.     Comments: Patient is oriented to person and place.  Thought the year was 38.  Answering questions appropriately.  PERRL and EOM intact.  No nystagmus.  CN II through XII grossly intact.  Can ambulate here in the ED.  Negative cerebellar exam.  Slight unsteadiness with Romberg, however no drift.  Can move all extremities with strength intact against resistance.  Sensation intact throughout.  Psychiatric:        Mood and Affect: Mood normal.        Behavior: Behavior normal.        Thought Content: Thought content normal.     ED Results / Procedures / Treatments   Labs (all labs ordered are listed, but only abnormal results are displayed) Labs Reviewed  CBC WITH DIFFERENTIAL/PLATELET - Abnormal; Notable for the following components:      Result Value   RBC 2.82 (*)    Hemoglobin 9.4 (*)    HCT 30.1 (*)    MCV 106.7 (*)    RDW 15.7 (*)    All other components within normal limits  COMPREHENSIVE METABOLIC PANEL - Abnormal; Notable for the following components:   Potassium 3.1 (*)    Glucose, Bld 122 (*)    BUN 26 (*)    Creatinine, Ser 6.54 (*)    Calcium 6.5  (*)    Total Protein 6.1 (*)    Albumin 2.9 (*)    GFR calc non Af Amer 7 (*)    GFR calc Af Amer 9 (*)    All other components within normal limits  CBG MONITORING, ED - Abnormal; Notable for the following components:   Glucose-Capillary 120 (*)    All other components within normal limits  TROPONIN I (HIGH SENSITIVITY)    EKG EKG Interpretation  Date/Time:  Thursday May 26 2020 13:32:44 EDT Ventricular Rate:  73 PR Interval:    QRS Duration: 89 QT Interval:  478 QTC Calculation: 527 R Axis:   -31 Text Interpretation: Ectopic atrial rhythm Left axis deviation Probable anteroseptal infarct, old Prolonged QT interval No significant change was found Confirmed by Gerlene Fee [phone]) on 05/26/2020 5:06:55 PM   Radiology DG Chest 2  View  Result Date: 05/26/2020 CLINICAL DATA:  Altered mental status. Cough short of breath and chest pain EXAM: CHEST - 2 VIEW COMPARISON:  05/17/2020 FINDINGS: Heart size and vascularity normal. Atherosclerotic calcification aortic arch Progression of left lower lobe streaky density compatible with atelectasis. No effusion. Right lung clear. Upper lumbar fracture unchanged. Vertebroplasty in the lumbar spine also unchanged. IMPRESSION: Progression of left lower lobe atelectasis. Electronically Signed   By: Franchot Gallo M.D.   On: 05/26/2020 14:13   CT Head Wo Contrast  Result Date: 05/26/2020 CLINICAL DATA:  Altered mental status EXAM: CT HEAD WITHOUT CONTRAST TECHNIQUE: Contiguous axial images were obtained from the base of the skull through the vertex without intravenous contrast. COMPARISON:  10/06/2019 FINDINGS: Brain: Bifrontal chronic subdural hematoma are again noted and are unchanged measuring 10 mm in thickness on the left and 8 mm in thickness on the right when measured in similar fashion. Stable minimal mass effect upon the left frontal lobe. Mild parenchymal volume loss is commensurate with the patient's age. Remote lacunar infarcts are again  noted within the left basal ganglia. Tiny remote infarct noted within the left cerebellum. Mild periventricular white matter changes are present likely reflecting the sequela of small vessel ischemia. No evidence of acute intracranial hemorrhage or infarct. No abnormal mass effect or midline shift. No abnormal intra or extra-axial mass lesion. Ventricular size is normal. Cerebellum is otherwise unremarkable. Vascular: No asymmetric hyperdense vasculature at the skull base. Skull: Bifrontal burr holes are again noted. The calvarium is otherwise intact. Sinuses/Orbits: The paranasal sinuses are clear. The orbits are unremarkable. Other: Mastoid air cells and middle ear cavities are clear. IMPRESSION: 1. No acute intracranial abnormality. 2. Stable bifrontal chronic subdural hematoma. 3. 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 Electronically Signed   By: Fidela Salisbury MD   On: 05/26/2020 15:40    Procedures Procedures (including critical care time)  Medications Ordered in ED Medications  potassium chloride SA (KLOR-CON) CR tablet 40 mEq (has no administration in time range)    ED Course  I have reviewed the triage vital signs and the nursing notes.  Pertinent labs & imaging results that were available during my care of the patient were reviewed by me and considered in my medical decision making (see chart for details).    MDM Rules/Calculators/A&P                          After speaking with patient's wife, Vaughan Basta, his reported dizziness, accelerated confusion, difficulty standing, headache, nausea, diaphoresis, and feeling as though he may pass out that occurred early this morning in the context of "simply not feeling good" for the past two weeks is concerning for presyncopal prodrome in setting of TIA/CVA, ACS, and other emergent pathology.  On my examination, patient is in no distress and without any complaints.  His physical exam is entirely unremarkable and he  is answering questions appropriately.    Labs CBC with differential: Mild anemia to 9.4, no significant deviation from baseline. Troponin: 16 CMP: Hypokalemia to 3.1.  Remainder is consistent with patient's baseline.  Imaging DG chest is personally reviewed and demonstrates mild progression of his left lower lobe atelectasis. CT head is obtained without contrast to evaluate for acute intracranial abnormalities.  I personally reviewed the imaging which demonstrates no acute abnormalities and stable bifrontal chronic subdural hematoma.  There is also remote lacunar infarcts within left basal ganglia and left cerebellum.  Patient's comprehensive work-up is reassuring.  I feel as though he is reasonable for discharge home.  Discussed case with Dr. Johnney Killian who personally evaluated patient and agrees with assessment and plan.    All of the evaluation and work-up results were discussed with the patient and any family at bedside.  Patient and/or family were informed that while patient is appropriate for discharge at this time, some medical emergencies may only develop or become detectable after a period of time.  I specifically instructed patient and/or family to return to return to the ED or seek immediate medical attention for any new or worsening symptoms.  They were provided opportunity to ask any additional questions and have none at this time.  Prior to discharge patient is feeling well, agreeable with plan for discharge home.  They have expressed understanding of verbal discharge instructions as well as return precautions and are agreeable to the plan.    Final Clinical Impression(s) / ED Diagnoses Final diagnoses:  Near syncope    Rx / DC Orders ED Discharge Orders    None       Corena Herter, PA-C 05/26/20 1740    Charlesetta Shanks, MD 05/29/20 [phone]

## 2020-05-26 NOTE — ED Notes (Signed)
Discharge teaching reviewed with patient. Pt verbalized understanding.

## 2020-05-26 NOTE — ED Triage Notes (Signed)
Pt from home with ems for brief period of disorientation. Pt was sitting in his recliner when he suddenly broke out into a sweat, felt nauseated and did not know the year. Pt has mild dementia at baseline. Dialysis pt MWF, last dialysis was yesterday. Pt alert, nad noted, VSS

## 2020-05-26 NOTE — Telephone Encounter (Addendum)
Prynce Jacober [phone]  [name] called to say that Rush Landmark is very confused, lethargic and feels like he is going to pass out this morning, his blood sugar is 103 , no fever 97.8. she has not took blood pressure. She is wandering if she should take him to ED to be checked out or what should she do. He has an appointment at 1:30 for Bone Density. [name] called back with blood pressure  139/36 138/36 pulse 73

## 2020-05-26 NOTE — Telephone Encounter (Signed)
[name] called back to say patient was refusing to go to ED with EMS. She put me on speaker phone and I told him that Dr Renold Genta exact words were his wife was to call 911 and he was to go to th ED to be evaluated. He said yes mam and that Dr Renold Genta was the boss and he would go.

## 2020-05-26 NOTE — ED Notes (Signed)
Patient transported to CT 

## 2020-05-26 NOTE — Discharge Instructions (Addendum)
Please follow-up with your primary care provider regarding today's encounter.  Your comprehensive evaluation obtained today was reassuring.  Your bifrontal chronic subdural hematomas appear to be stable.  Your laboratory work-up was consistent with baseline.  You did have low potassium (3.1).  We replenished here in the ED.  You will need to have this rechecked to ensure correction.   Given your reassuring vital signs and physical exam, feel as though you are reasonable for discharge at this time.  However, please have a low threshold for return to the ED for any new or worsening symptoms.

## 2020-05-26 NOTE — ED Notes (Signed)
Patient transported to x-ray. ?

## 2020-05-26 NOTE — Telephone Encounter (Signed)
Patient's wife was notified, she said she will cancel the bone density and call 911 to take him to the ED.

## 2020-05-27 DIAGNOSIS — Z992 Dependence on renal dialysis: Secondary | ICD-10-CM | POA: Diagnosis not present

## 2020-05-27 DIAGNOSIS — D509 Iron deficiency anemia, unspecified: Secondary | ICD-10-CM | POA: Diagnosis not present

## 2020-05-27 DIAGNOSIS — N2581 Secondary hyperparathyroidism of renal origin: Secondary | ICD-10-CM | POA: Diagnosis not present

## 2020-05-27 DIAGNOSIS — E119 Type 2 diabetes mellitus without complications: Secondary | ICD-10-CM | POA: Diagnosis not present

## 2020-05-27 DIAGNOSIS — N186 End stage renal disease: Secondary | ICD-10-CM | POA: Diagnosis not present

## 2020-05-30 DIAGNOSIS — E119 Type 2 diabetes mellitus without complications: Secondary | ICD-10-CM | POA: Diagnosis not present

## 2020-05-30 DIAGNOSIS — N2581 Secondary hyperparathyroidism of renal origin: Secondary | ICD-10-CM | POA: Diagnosis not present

## 2020-05-30 DIAGNOSIS — N186 End stage renal disease: Secondary | ICD-10-CM | POA: Diagnosis not present

## 2020-05-30 DIAGNOSIS — D509 Iron deficiency anemia, unspecified: Secondary | ICD-10-CM | POA: Diagnosis not present

## 2020-05-30 DIAGNOSIS — Z992 Dependence on renal dialysis: Secondary | ICD-10-CM | POA: Diagnosis not present

## 2020-06-02 DIAGNOSIS — Z992 Dependence on renal dialysis: Secondary | ICD-10-CM | POA: Diagnosis not present

## 2020-06-02 DIAGNOSIS — N186 End stage renal disease: Secondary | ICD-10-CM | POA: Diagnosis not present

## 2020-06-06 ENCOUNTER — Telehealth: Payer: Self-pay | Admitting: Internal Medicine

## 2020-06-06 DIAGNOSIS — E119 Type 2 diabetes mellitus without complications: Secondary | ICD-10-CM | POA: Diagnosis not present

## 2020-06-06 DIAGNOSIS — N2581 Secondary hyperparathyroidism of renal origin: Secondary | ICD-10-CM | POA: Diagnosis not present

## 2020-06-06 DIAGNOSIS — N186 End stage renal disease: Secondary | ICD-10-CM | POA: Diagnosis not present

## 2020-06-06 DIAGNOSIS — Z992 Dependence on renal dialysis: Secondary | ICD-10-CM | POA: Diagnosis not present

## 2020-06-06 DIAGNOSIS — D509 Iron deficiency anemia, unspecified: Secondary | ICD-10-CM | POA: Diagnosis not present

## 2020-06-06 NOTE — Telephone Encounter (Signed)
She should call his orthopedist. If he does not have one, options are [facility] [phone] or [facility].

## 2020-06-06 NOTE — Telephone Encounter (Signed)
Called and spoke with [name] gave her both phone numbers and she was going to call them and get patient an appointment.

## 2020-06-06 NOTE — Telephone Encounter (Signed)
[name]  [phone]  [name] called to say while [place] were [place] the beach this weekend, that [place] injured his left wrist some how, she is not sure how he injured it. It is very painful for him to use.

## 2020-06-08 DIAGNOSIS — Z992 Dependence on renal dialysis: Secondary | ICD-10-CM | POA: Diagnosis not present

## 2020-06-08 DIAGNOSIS — N186 End stage renal disease: Secondary | ICD-10-CM | POA: Diagnosis not present

## 2020-06-08 DIAGNOSIS — D509 Iron deficiency anemia, unspecified: Secondary | ICD-10-CM | POA: Diagnosis not present

## 2020-06-08 DIAGNOSIS — E119 Type 2 diabetes mellitus without complications: Secondary | ICD-10-CM | POA: Diagnosis not present

## 2020-06-08 DIAGNOSIS — N2581 Secondary hyperparathyroidism of renal origin: Secondary | ICD-10-CM | POA: Diagnosis not present

## 2020-06-10 DIAGNOSIS — N186 End stage renal disease: Secondary | ICD-10-CM | POA: Diagnosis not present

## 2020-06-10 DIAGNOSIS — Z992 Dependence on renal dialysis: Secondary | ICD-10-CM | POA: Diagnosis not present

## 2020-06-10 DIAGNOSIS — N2581 Secondary hyperparathyroidism of renal origin: Secondary | ICD-10-CM | POA: Diagnosis not present

## 2020-06-10 DIAGNOSIS — D509 Iron deficiency anemia, unspecified: Secondary | ICD-10-CM | POA: Diagnosis not present

## 2020-06-10 DIAGNOSIS — E119 Type 2 diabetes mellitus without complications: Secondary | ICD-10-CM | POA: Diagnosis not present

## 2020-06-12 ENCOUNTER — Encounter: Payer: Self-pay | Admitting: Internal Medicine

## 2020-06-12 NOTE — Patient Instructions (Signed)
Have chest x-ray since you are still coughing.  Continue current medications and follow-up in 6 months.  Be careful with regard to ambulation as you work at risk for falling.

## 2020-06-13 ENCOUNTER — Encounter: Payer: Self-pay | Admitting: Nephrology

## 2020-06-13 DIAGNOSIS — N186 End stage renal disease: Secondary | ICD-10-CM | POA: Diagnosis not present

## 2020-06-13 DIAGNOSIS — D509 Iron deficiency anemia, unspecified: Secondary | ICD-10-CM | POA: Diagnosis not present

## 2020-06-13 DIAGNOSIS — Z992 Dependence on renal dialysis: Secondary | ICD-10-CM | POA: Diagnosis not present

## 2020-06-13 DIAGNOSIS — E119 Type 2 diabetes mellitus without complications: Secondary | ICD-10-CM | POA: Diagnosis not present

## 2020-06-13 DIAGNOSIS — N2581 Secondary hyperparathyroidism of renal origin: Secondary | ICD-10-CM | POA: Diagnosis not present

## 2020-06-15 DIAGNOSIS — N2581 Secondary hyperparathyroidism of renal origin: Secondary | ICD-10-CM | POA: Diagnosis not present

## 2020-06-15 DIAGNOSIS — Z992 Dependence on renal dialysis: Secondary | ICD-10-CM | POA: Diagnosis not present

## 2020-06-15 DIAGNOSIS — E119 Type 2 diabetes mellitus without complications: Secondary | ICD-10-CM | POA: Diagnosis not present

## 2020-06-15 DIAGNOSIS — N186 End stage renal disease: Secondary | ICD-10-CM | POA: Diagnosis not present

## 2020-06-15 DIAGNOSIS — D509 Iron deficiency anemia, unspecified: Secondary | ICD-10-CM | POA: Diagnosis not present

## 2020-06-17 DIAGNOSIS — E119 Type 2 diabetes mellitus without complications: Secondary | ICD-10-CM | POA: Diagnosis not present

## 2020-06-17 DIAGNOSIS — N2581 Secondary hyperparathyroidism of renal origin: Secondary | ICD-10-CM | POA: Diagnosis not present

## 2020-06-17 DIAGNOSIS — Z992 Dependence on renal dialysis: Secondary | ICD-10-CM | POA: Diagnosis not present

## 2020-06-17 DIAGNOSIS — N186 End stage renal disease: Secondary | ICD-10-CM | POA: Diagnosis not present

## 2020-06-17 DIAGNOSIS — D509 Iron deficiency anemia, unspecified: Secondary | ICD-10-CM | POA: Diagnosis not present

## 2020-06-20 DIAGNOSIS — N2581 Secondary hyperparathyroidism of renal origin: Secondary | ICD-10-CM | POA: Diagnosis not present

## 2020-06-20 DIAGNOSIS — Z992 Dependence on renal dialysis: Secondary | ICD-10-CM | POA: Diagnosis not present

## 2020-06-20 DIAGNOSIS — D509 Iron deficiency anemia, unspecified: Secondary | ICD-10-CM | POA: Diagnosis not present

## 2020-06-20 DIAGNOSIS — E119 Type 2 diabetes mellitus without complications: Secondary | ICD-10-CM | POA: Diagnosis not present

## 2020-06-20 DIAGNOSIS — N186 End stage renal disease: Secondary | ICD-10-CM | POA: Diagnosis not present

## 2020-06-22 DIAGNOSIS — N2581 Secondary hyperparathyroidism of renal origin: Secondary | ICD-10-CM | POA: Diagnosis not present

## 2020-06-22 DIAGNOSIS — D509 Iron deficiency anemia, unspecified: Secondary | ICD-10-CM | POA: Diagnosis not present

## 2020-06-22 DIAGNOSIS — E119 Type 2 diabetes mellitus without complications: Secondary | ICD-10-CM | POA: Diagnosis not present

## 2020-06-22 DIAGNOSIS — Z992 Dependence on renal dialysis: Secondary | ICD-10-CM | POA: Diagnosis not present

## 2020-06-22 DIAGNOSIS — N186 End stage renal disease: Secondary | ICD-10-CM | POA: Diagnosis not present

## 2020-06-24 DIAGNOSIS — N2581 Secondary hyperparathyroidism of renal origin: Secondary | ICD-10-CM | POA: Diagnosis not present

## 2020-06-24 DIAGNOSIS — N186 End stage renal disease: Secondary | ICD-10-CM | POA: Diagnosis not present

## 2020-06-24 DIAGNOSIS — E1129 Type 2 diabetes mellitus with other diabetic kidney complication: Secondary | ICD-10-CM | POA: Diagnosis not present

## 2020-06-24 DIAGNOSIS — D631 Anemia in chronic kidney disease: Secondary | ICD-10-CM | POA: Diagnosis not present

## 2020-06-24 DIAGNOSIS — E119 Type 2 diabetes mellitus without complications: Secondary | ICD-10-CM | POA: Diagnosis not present

## 2020-06-24 DIAGNOSIS — Z992 Dependence on renal dialysis: Secondary | ICD-10-CM | POA: Diagnosis not present

## 2020-06-24 DIAGNOSIS — E1122 Type 2 diabetes mellitus with diabetic chronic kidney disease: Secondary | ICD-10-CM | POA: Diagnosis not present

## 2020-06-24 DIAGNOSIS — D509 Iron deficiency anemia, unspecified: Secondary | ICD-10-CM | POA: Diagnosis not present

## 2020-06-27 DIAGNOSIS — Z992 Dependence on renal dialysis: Secondary | ICD-10-CM | POA: Diagnosis not present

## 2020-06-27 DIAGNOSIS — N186 End stage renal disease: Secondary | ICD-10-CM | POA: Diagnosis not present

## 2020-06-27 DIAGNOSIS — D509 Iron deficiency anemia, unspecified: Secondary | ICD-10-CM | POA: Diagnosis not present

## 2020-06-27 DIAGNOSIS — E119 Type 2 diabetes mellitus without complications: Secondary | ICD-10-CM | POA: Diagnosis not present

## 2020-06-27 DIAGNOSIS — N2581 Secondary hyperparathyroidism of renal origin: Secondary | ICD-10-CM | POA: Diagnosis not present

## 2020-06-29 DIAGNOSIS — E119 Type 2 diabetes mellitus without complications: Secondary | ICD-10-CM | POA: Diagnosis not present

## 2020-06-29 DIAGNOSIS — D509 Iron deficiency anemia, unspecified: Secondary | ICD-10-CM | POA: Diagnosis not present

## 2020-06-29 DIAGNOSIS — N186 End stage renal disease: Secondary | ICD-10-CM | POA: Diagnosis not present

## 2020-06-29 DIAGNOSIS — Z992 Dependence on renal dialysis: Secondary | ICD-10-CM | POA: Diagnosis not present

## 2020-06-29 DIAGNOSIS — N2581 Secondary hyperparathyroidism of renal origin: Secondary | ICD-10-CM | POA: Diagnosis not present

## 2020-07-01 DIAGNOSIS — N186 End stage renal disease: Secondary | ICD-10-CM | POA: Diagnosis not present

## 2020-07-01 DIAGNOSIS — Z992 Dependence on renal dialysis: Secondary | ICD-10-CM | POA: Diagnosis not present

## 2020-07-01 DIAGNOSIS — D509 Iron deficiency anemia, unspecified: Secondary | ICD-10-CM | POA: Diagnosis not present

## 2020-07-01 DIAGNOSIS — N2581 Secondary hyperparathyroidism of renal origin: Secondary | ICD-10-CM | POA: Diagnosis not present

## 2020-07-01 DIAGNOSIS — E119 Type 2 diabetes mellitus without complications: Secondary | ICD-10-CM | POA: Diagnosis not present

## 2020-07-04 DIAGNOSIS — N186 End stage renal disease: Secondary | ICD-10-CM | POA: Diagnosis not present

## 2020-07-04 DIAGNOSIS — N2581 Secondary hyperparathyroidism of renal origin: Secondary | ICD-10-CM | POA: Diagnosis not present

## 2020-07-04 DIAGNOSIS — E119 Type 2 diabetes mellitus without complications: Secondary | ICD-10-CM | POA: Diagnosis not present

## 2020-07-04 DIAGNOSIS — Z992 Dependence on renal dialysis: Secondary | ICD-10-CM | POA: Diagnosis not present

## 2020-07-04 DIAGNOSIS — D509 Iron deficiency anemia, unspecified: Secondary | ICD-10-CM | POA: Diagnosis not present

## 2020-07-06 DIAGNOSIS — N186 End stage renal disease: Secondary | ICD-10-CM | POA: Diagnosis not present

## 2020-07-06 DIAGNOSIS — N2581 Secondary hyperparathyroidism of renal origin: Secondary | ICD-10-CM | POA: Diagnosis not present

## 2020-07-06 DIAGNOSIS — E119 Type 2 diabetes mellitus without complications: Secondary | ICD-10-CM | POA: Diagnosis not present

## 2020-07-06 DIAGNOSIS — D509 Iron deficiency anemia, unspecified: Secondary | ICD-10-CM | POA: Diagnosis not present

## 2020-07-06 DIAGNOSIS — Z992 Dependence on renal dialysis: Secondary | ICD-10-CM | POA: Diagnosis not present

## 2020-07-08 DIAGNOSIS — N2581 Secondary hyperparathyroidism of renal origin: Secondary | ICD-10-CM | POA: Diagnosis not present

## 2020-07-08 DIAGNOSIS — N186 End stage renal disease: Secondary | ICD-10-CM | POA: Diagnosis not present

## 2020-07-08 DIAGNOSIS — D509 Iron deficiency anemia, unspecified: Secondary | ICD-10-CM | POA: Diagnosis not present

## 2020-07-08 DIAGNOSIS — Z992 Dependence on renal dialysis: Secondary | ICD-10-CM | POA: Diagnosis not present

## 2020-07-08 DIAGNOSIS — E119 Type 2 diabetes mellitus without complications: Secondary | ICD-10-CM | POA: Diagnosis not present

## 2020-07-11 DIAGNOSIS — N2581 Secondary hyperparathyroidism of renal origin: Secondary | ICD-10-CM | POA: Diagnosis not present

## 2020-07-11 DIAGNOSIS — N186 End stage renal disease: Secondary | ICD-10-CM | POA: Diagnosis not present

## 2020-07-11 DIAGNOSIS — Z992 Dependence on renal dialysis: Secondary | ICD-10-CM | POA: Diagnosis not present

## 2020-07-11 DIAGNOSIS — D509 Iron deficiency anemia, unspecified: Secondary | ICD-10-CM | POA: Diagnosis not present

## 2020-07-11 DIAGNOSIS — E119 Type 2 diabetes mellitus without complications: Secondary | ICD-10-CM | POA: Diagnosis not present

## 2020-07-13 DIAGNOSIS — N2581 Secondary hyperparathyroidism of renal origin: Secondary | ICD-10-CM | POA: Diagnosis not present

## 2020-07-13 DIAGNOSIS — E1129 Type 2 diabetes mellitus with other diabetic kidney complication: Secondary | ICD-10-CM | POA: Diagnosis not present

## 2020-07-13 DIAGNOSIS — N186 End stage renal disease: Secondary | ICD-10-CM | POA: Diagnosis not present

## 2020-07-13 DIAGNOSIS — D509 Iron deficiency anemia, unspecified: Secondary | ICD-10-CM | POA: Diagnosis not present

## 2020-07-13 DIAGNOSIS — E119 Type 2 diabetes mellitus without complications: Secondary | ICD-10-CM | POA: Diagnosis not present

## 2020-07-13 DIAGNOSIS — Z992 Dependence on renal dialysis: Secondary | ICD-10-CM | POA: Diagnosis not present

## 2020-07-15 ENCOUNTER — Other Ambulatory Visit (INDEPENDENT_AMBULATORY_CARE_PROVIDER_SITE_OTHER): Payer: Medicare Other

## 2020-07-15 ENCOUNTER — Telehealth: Payer: Self-pay | Admitting: Gastroenterology

## 2020-07-15 DIAGNOSIS — D649 Anemia, unspecified: Secondary | ICD-10-CM

## 2020-07-15 DIAGNOSIS — K5520 Angiodysplasia of colon without hemorrhage: Secondary | ICD-10-CM

## 2020-07-15 DIAGNOSIS — E119 Type 2 diabetes mellitus without complications: Secondary | ICD-10-CM | POA: Diagnosis not present

## 2020-07-15 DIAGNOSIS — D509 Iron deficiency anemia, unspecified: Secondary | ICD-10-CM | POA: Diagnosis not present

## 2020-07-15 DIAGNOSIS — N186 End stage renal disease: Secondary | ICD-10-CM | POA: Diagnosis not present

## 2020-07-15 DIAGNOSIS — N2581 Secondary hyperparathyroidism of renal origin: Secondary | ICD-10-CM | POA: Diagnosis not present

## 2020-07-15 DIAGNOSIS — Z992 Dependence on renal dialysis: Secondary | ICD-10-CM | POA: Diagnosis not present

## 2020-07-15 LAB — CBC WITH DIFFERENTIAL/PLATELET
Basophils Absolute: 0.1 10*3/uL (ref 0.0–0.1)
Basophils Relative: 0.9 % (ref 0.0–3.0)
Eosinophils Absolute: 0.3 10*3/uL (ref 0.0–0.7)
Eosinophils Relative: 3.3 % (ref 0.0–5.0)
HCT: 35.3 % — ABNORMAL LOW (ref 39.0–52.0)
Hemoglobin: 11.5 g/dL — ABNORMAL LOW (ref 13.0–17.0)
Lymphocytes Relative: 16.8 % (ref 12.0–46.0)
Lymphs Abs: 1.7 10*3/uL (ref 0.7–4.0)
MCHC: 32.6 g/dL (ref 30.0–36.0)
MCV: 108.6 fl — ABNORMAL HIGH (ref 78.0–100.0)
Monocytes Absolute: 0.6 10*3/uL (ref 0.1–1.0)
Monocytes Relative: 5.6 % (ref 3.0–12.0)
Neutro Abs: 7.4 10*3/uL (ref 1.4–7.7)
Neutrophils Relative %: 73.4 % (ref 43.0–77.0)
Platelets: 174 10*3/uL (ref 150.0–400.0)
RBC: 3.25 Mil/uL — ABNORMAL LOW (ref 4.22–5.81)
RDW: 18.2 % — ABNORMAL HIGH (ref 11.5–15.5)
WBC: 10 10*3/uL (ref 4.0–10.5)

## 2020-07-15 NOTE — Telephone Encounter (Signed)
Patient wife called states the patient is having stomach bleed again and his stool is black seeking advise

## 2020-07-15 NOTE — Telephone Encounter (Signed)
LMOM for patient to call back.

## 2020-07-15 NOTE — Telephone Encounter (Signed)
Agree with plan for CBC today.  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

## 2020-07-15 NOTE — Telephone Encounter (Signed)
Spoke to patient's wife who states that patient is passing black stools again. This has been noted over the past few days. He has mild lower abdominal pain no N/V or fever no SOB or CP. He was advised to get CBC done before dialysis today. Dr Bryan Lemma please advise on any further treatment

## 2020-07-17 ENCOUNTER — Other Ambulatory Visit: Payer: Self-pay

## 2020-07-17 ENCOUNTER — Emergency Department (HOSPITAL_COMMUNITY): Payer: Medicare Other

## 2020-07-17 ENCOUNTER — Emergency Department (HOSPITAL_COMMUNITY)
Admission: EM | Admit: 2020-07-17 | Discharge: 2020-07-17 | Disposition: A | Discharge status: Home / Self Care | Payer: Medicare Other | Attending: Emergency Medicine | Admitting: Emergency Medicine

## 2020-07-17 ENCOUNTER — Encounter (HOSPITAL_COMMUNITY): Payer: Self-pay

## 2020-07-17 DIAGNOSIS — E1122 Type 2 diabetes mellitus with diabetic chronic kidney disease: Secondary | ICD-10-CM | POA: Diagnosis not present

## 2020-07-17 DIAGNOSIS — R42 Dizziness and giddiness: Secondary | ICD-10-CM | POA: Diagnosis not present

## 2020-07-17 DIAGNOSIS — Z87891 Personal history of nicotine dependence: Secondary | ICD-10-CM | POA: Insufficient documentation

## 2020-07-17 DIAGNOSIS — E039 Hypothyroidism, unspecified: Secondary | ICD-10-CM | POA: Diagnosis not present

## 2020-07-17 DIAGNOSIS — R5383 Other fatigue: Secondary | ICD-10-CM | POA: Diagnosis not present

## 2020-07-17 DIAGNOSIS — I120 Hypertensive chronic kidney disease with stage 5 chronic kidney disease or end stage renal disease: Secondary | ICD-10-CM | POA: Insufficient documentation

## 2020-07-17 DIAGNOSIS — I2510 Atherosclerotic heart disease of native coronary artery without angina pectoris: Secondary | ICD-10-CM | POA: Insufficient documentation

## 2020-07-17 DIAGNOSIS — R1032 Left lower quadrant pain: Secondary | ICD-10-CM | POA: Diagnosis not present

## 2020-07-17 DIAGNOSIS — N186 End stage renal disease: Secondary | ICD-10-CM | POA: Diagnosis not present

## 2020-07-17 DIAGNOSIS — K921 Melena: Secondary | ICD-10-CM | POA: Diagnosis not present

## 2020-07-17 DIAGNOSIS — Z794 Long term (current) use of insulin: Secondary | ICD-10-CM | POA: Insufficient documentation

## 2020-07-17 DIAGNOSIS — N281 Cyst of kidney, acquired: Secondary | ICD-10-CM | POA: Diagnosis not present

## 2020-07-17 DIAGNOSIS — K5790 Diverticulosis of intestine, part unspecified, without perforation or abscess without bleeding: Secondary | ICD-10-CM | POA: Diagnosis not present

## 2020-07-17 DIAGNOSIS — R195 Other fecal abnormalities: Secondary | ICD-10-CM

## 2020-07-17 DIAGNOSIS — N261 Atrophy of kidney (terminal): Secondary | ICD-10-CM | POA: Diagnosis not present

## 2020-07-17 DIAGNOSIS — R102 Pelvic and perineal pain: Secondary | ICD-10-CM | POA: Diagnosis not present

## 2020-07-17 LAB — CBC WITH DIFFERENTIAL/PLATELET
Abs Immature Granulocytes: 0.02 10*3/uL (ref 0.00–0.07)
Basophils Absolute: 0.1 10*3/uL (ref 0.0–0.1)
Basophils Relative: 1 %
Eosinophils Absolute: 0.4 10*3/uL (ref 0.0–0.5)
Eosinophils Relative: 4 %
HCT: 33.5 % — ABNORMAL LOW (ref 39.0–52.0)
Hemoglobin: 10.8 g/dL — ABNORMAL LOW (ref 13.0–17.0)
Immature Granulocytes: 0 %
Lymphocytes Relative: 13 %
Lymphs Abs: 1.2 10*3/uL (ref 0.7–4.0)
MCH: 35.8 pg — ABNORMAL HIGH (ref 26.0–34.0)
MCHC: 32.2 g/dL (ref 30.0–36.0)
MCV: 110.9 fL — ABNORMAL HIGH (ref 80.0–100.0)
Monocytes Absolute: 0.8 10*3/uL (ref 0.1–1.0)
Monocytes Relative: 8 %
Neutro Abs: 7.2 10*3/uL (ref 1.7–7.7)
Neutrophils Relative %: 74 %
Platelets: 155 10*3/uL (ref 150–400)
RBC: 3.02 MIL/uL — ABNORMAL LOW (ref 4.22–5.81)
RDW: 19 % — ABNORMAL HIGH (ref 11.5–15.5)
WBC: 9.7 10*3/uL (ref 4.0–10.5)
nRBC: 0.6 % — ABNORMAL HIGH (ref 0.0–0.2)

## 2020-07-17 LAB — COMPREHENSIVE METABOLIC PANEL
ALT: 9 U/L (ref 0–44)
AST: 25 U/L (ref 15–41)
Albumin: 3 g/dL — ABNORMAL LOW (ref 3.5–5.0)
Alkaline Phosphatase: 201 U/L — ABNORMAL HIGH (ref 38–126)
Anion gap: 16 — ABNORMAL HIGH (ref 5–15)
BUN: 30 mg/dL — ABNORMAL HIGH (ref 8–23)
CO2: 26 mmol/L (ref 22–32)
Calcium: 9.3 mg/dL (ref 8.9–10.3)
Chloride: 99 mmol/L (ref 98–111)
Creatinine, Ser: 7.84 mg/dL — ABNORMAL HIGH (ref 0.61–1.24)
GFR, Estimated: 7 mL/min — ABNORMAL LOW (ref >60)
Glucose, Bld: 131 mg/dL — ABNORMAL HIGH (ref 70–99)
Potassium: 3.4 mmol/L — ABNORMAL LOW (ref 3.5–5.1)
Sodium: 141 mmol/L (ref 135–145)
Total Bilirubin: 0.5 mg/dL (ref 0.3–1.2)
Total Protein: 6 g/dL — ABNORMAL LOW (ref 6.5–8.1)

## 2020-07-17 LAB — TYPE AND SCREEN
ABO/RH(D): O POS
Antibody Screen: NEGATIVE

## 2020-07-17 LAB — LIPASE, BLOOD: Lipase: 39 U/L (ref 11–51)

## 2020-07-17 LAB — POC OCCULT BLOOD, ED: Fecal Occult Bld: NEGATIVE

## 2020-07-17 LAB — CBG MONITORING, ED: Glucose-Capillary: 123 mg/dL — ABNORMAL HIGH (ref 70–99)

## 2020-07-17 MED ORDER — SODIUM CHLORIDE (PF) 0.9 % IJ SOLN
INTRAMUSCULAR | Status: AC
  Filled 2020-07-17: qty 100

## 2020-07-17 MED ORDER — IOHEXOL 300 MG/ML  SOLN
100.0000 mL | Freq: Once | INTRAMUSCULAR | Status: AC | PRN
  Administered 2020-07-17: 100 mL via INTRAVENOUS

## 2020-07-17 NOTE — ED Triage Notes (Signed)
Pt arrived via walk in, c/o "dark bloody stools" x3 days and mild fatigue, hx of GI bleed. Denies any n/v or diarrhea. Denies fevers and chills.

## 2020-07-17 NOTE — ED Provider Notes (Signed)
[facility] DEPT Provider Note   CSN: [id number] Arrival date & time: 07/17/20  1427     History Chief Complaint  Patient presents with   Blood In [place] is a 79 y.o. male.  79 year old male with history of diverticulitis, diabetes, ESRD with complaint of dark stools for the past 3 days with complaint of fatigue and dizziness and left lower abdominal pain.  Patient is not anticoagulated and reports similar history in the past with nonspecific history of colon surgery.  Patient has a history of end-stage renal disease, took dialysis Monday Monday, Wednesday, Friday, last treatment was on Friday.        Past Medical History:  Diagnosis Date   Allergy    Anemia    Arthritis    Cataract    bil cateracts removed   Coronary artery disease    Dementia arising in the senium and presenium ([place])    Diabetes mellitus    Type 2   Diverticulitis    ED (erectile dysfunction)    Elevated homocysteine    ESRD (end stage renal disease) on dialysis ([place]) 03/2015   GERD (gastroesophageal reflux disease)    pepto    Gout    Hiatal hernia    Hyperlipidemia    Hypertension    Hypothyroidism    Pneumonia    PVD (peripheral vascular disease) ([place])    has plastic aorta   Renal insufficiency    Seasonal allergies    Sleep apnea    does not wear c-pap    Patient Active Problem List   Diagnosis Date Noted   Rectal bleeding 04/22/2020   AVM (arteriovenous malformation) of small bowel, acquired    Hiatal hernia    Schatzki's ring of distal esophagus    Melena 11/19/2018   Leukocytosis    Anemia    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   Diabetes mellitus type 2 in nonobese (HCC)    Loose stools    Dementia arising in the senium and presenium ([place])    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   Multiple fractures of ribs, left side, initial encounter for closed  fracture 07/21/2018   Subdural hematoma ([place]) 07/21/2018   Angiodysplasia of stomach    Occult GI bleeding    GIB (gastrointestinal bleeding) 06/01/2018   Low back pain 01/17/2018   Anorexia    Dyspnea    Macrocytic anemia 06/04/2016   Thrombocytopenia ([place]) 06/04/2016   Exertional dyspnea 06/04/2016   Arm paresthesia, left 06/04/2016   Dyspnea on exertion 06/04/2016   Tenderness of right calf 06/04/2016   Mild cognitive impairment with memory loss 01/12/2016   ESRD on dialysis ([place]) 05/17/2015   Hypoglycemia    Weakness 02/18/2015   OSA (obstructive sleep apnea) 02/02/2015   Hyperkalemia 10/05/2014   End stage renal disease ([place]) 11/18/2013   BPH (benign prostatic hyperplasia) 11/22/2012   Peripheral vascular disease ([place]) 12/24/2011   Hyperparathyroidism ([place]) 12/24/2011   Hypothyroidism 12/24/2011   Allergic rhinitis 12/24/2011   Erectile dysfunction 12/24/2011   Insulin dependent diabetes mellitus 09/03/2008   Hyperlipidemia 09/03/2008   Essential hypertension 08/30/2008   PANCREATITIS, HX OF 08/30/2008   RENAL FAILURE, ACUTE, HX OF 08/30/2008   DIVERTICULOSIS, COLON 06/08/2003    Past Surgical History:  Procedure Laterality Date   aortobifemoral bypass     AV FISTULA PLACEMENT Left 12/01/2013   Procedure: ARTERIOVENOUS (AV) FISTULA CREATION- LEFT BRACHIOCEPHALIC;  Surgeon: Angelia Mould,  MD;  Location: [facility];  Service: Vascular;  Laterality: Left;   [place] Right 07/27/2014   Procedure: [place];  Surgeon: Angelia Mould, MD;  Location: [facility];  Service: Vascular;  Laterality: Right;   BRAIN SURGERY  07/22/2018   BREAST SURGERY     left - granulomatous mastitis   BURR HOLE Bilateral 07/22/2018   Procedure: BILATERAL BURR HOLES;  Surgeon: Kristeen Miss, MD;  Location: [facility];  Service: Neurosurgery;  Laterality: Bilateral;   COLONOSCOPY     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  ([place] HX)  1992   ENTEROSCOPY N/A 06/03/2018   Procedure: ENTEROSCOPY;  Surgeon: Lavena Bullion, DO;  Location: [facility];  Service: Gastroenterology;  Laterality: N/A;   ENTEROSCOPY N/A 11/20/2018   Procedure: ENTEROSCOPY;  Surgeon: Rush Landmark Telford Nab., MD;  Location: [facility];  Service: Gastroenterology;  Laterality: N/A;   ENTEROSCOPY N/A 12/03/2019   Procedure: ENTEROSCOPY;  Surgeon: Lavena Bullion, DO;  Location: [facility];  Service: Gastroenterology;  Laterality: N/A;  push enteroscopy   EYE SURGERY Bilateral    cataracts   HEMODIALYSIS INPATIENT  01/17/2018       HOT HEMOSTASIS N/A 06/03/2018   Procedure: HOT HEMOSTASIS (ARGON PLASMA COAGULATION/BICAP);  Surgeon: Lavena Bullion, DO;  Location: [facility] ENDOSCOPY;  Service: Gastroenterology;  Laterality: N/A;   HOT HEMOSTASIS N/A 11/20/2018   Procedure: HOT HEMOSTASIS (ARGON PLASMA COAGULATION/BICAP);  Surgeon: Irving Copas., MD;  Location: [facility];  Service: Gastroenterology;  Laterality: N/A;   HOT HEMOSTASIS N/A 12/03/2019   Procedure: HOT HEMOSTASIS (ARGON PLASMA COAGULATION/BICAP);  Surgeon: Lavena Bullion, DO;  Location: [facility];  Service: Gastroenterology;  Laterality: N/A;   REVISION OF ARTERIOVENOUS GORETEX GRAFT Right 09/01/2019   Procedure: REVISION OF ARTERIOVENOUS FISTULA RIGHT ARM;  Surgeon: Serafina [facility], MD;  Location: [facility];  Service: Vascular;  Laterality: Right;   REVISON OF ARTERIOVENOUS FISTULA Left 02/09/2014   Procedure: REVISON OF LEFT ARTERIOVENOUS FISTULA - RESECTION OF RENDUNDANT VEIN;  Surgeon: Angelia Mould, MD;  Location: [facility];  Service: Vascular;  Laterality: Left;   SBO with lysis adhesions     SHUNTOGRAM Left 04/19/2014   Procedure: FISTULOGRAM;  Surgeon: Angelia Mould, MD;  Location[facility] LAB;  Service: Cardiovascular;  Laterality: Left;   UNILATERAL UPPER EXTREMEITY ANGIOGRAM N/A 07/12/2014   Procedure: UNILATERAL UPPER Anselmo Rod;  Surgeon: Angelia Mould, MD;  Location: [facility] C[facility] LAB;  Service: Cardiovascular;  Laterality: N/A;       Family History  Problem Relation Age of Onset   Aneurysm Mother        brain   Heart disease Father    Stroke Father    Hypertension Father    Diabetes Father    Dementia Neg Hx    Colon cancer Neg Hx    Esophageal cancer Neg Hx    Pancreatic cancer Neg Hx    Prostate cancer Neg Hx    Rectal cancer Neg Hx    Stomach cancer Neg Hx     Social History   Tobacco Use   Smoking status: Former Smoker    Quit date: 09/24/1994    Years since quitting: 25.8   Smokeless tobacco: Never Used  Vaping Use   Vaping Use: Never used  Substance Use Topics   Alcohol use: No    Alcohol/week: 0.0 standard drinks    Comment: Quit Oct. 1977 ("somewhat heavy")   Drug use: No  Home Medications Prior to Admission medications   Medication Sig Start Date End Date Taking? Authorizing Provider  acetaminophen (TYLENOL) 325 MG tablet Take 1-2 tablets (325-650 mg total) by mouth every 4 (four) hours as needed for mild pain. Patient taking differently: Take 650 mg by mouth in the morning and at bedtime.  08/01/18  Yes Love, Ivan Anchors, PA-C  albuterol (VENTOLIN HFA) 108 (90 Base) MCG/ACT inhaler Inhale 1 puff into the lungs daily as needed for wheezing or shortness of breath.   Yes [provider]  allopurinol (ZYLOPRIM) 100 MG tablet Take 100 mg by mouth daily.    Yes [provider]  calcium elemental as carbonate (TUMS ULTRA 1000) 400 MG chewable tablet Chew 1,000 mg by mouth 2 (two) times daily.   Yes [provider]  camphor-menthol Timoteo Ace) lotion Apply 1 application topically 4 (four) times daily as needed for itching.    Yes [provider]  Carboxymethylcellulose Sodium (THERATEARS) 0.25 % SOLN Place 1 drop into both eyes daily as needed (dry eyes).   Yes [provider]  cetirizine (ZYRTEC) 10 MG chewable tablet Chew  10 mg by mouth daily as needed (allergies.).    Yes [provider]  clobetasol (TEMOVATE) 0.05 % external solution Apply 1 application topically 2 (two) times daily as needed (scalp irritation.).  04/19/15  Yes [provider]  denosumab (PROLIA) 60 MG/ML SOSY injection Inject 60 mg into the skin every 6 (six) months.   Yes [provider]  donepezil (ARICEPT) 10 MG tablet Take 10 mg by mouth at bedtime.  01/13/19  Yes [provider]  FLUOCINOLONE ACETONIDE SCALP 0.01 % OIL Apply 1 application topically daily as needed (scalp irritation.).  04/28/15  Yes [provider]  fluticasone (FLONASE) 50 MCG/ACT nasal spray Place 1 spray into both nostrils daily. Patient taking differently: Place 1 spray into both nostrils daily as needed for allergies.  10/08/14  Yes Mikhail, Velta Addison, DO  hydrocortisone 2.5 % cream Apply 1 application topically 3 (three) times daily as needed (skin irritation (legs & arms)).  01/13/19  Yes [provider]  insulin aspart (NOVOLOG FLEXPEN) 100 UNIT/ML FlexPen INJECT 6-8 UNITS UNDER THE SKIN THREE TIMES DAILY BEFORE MEALS. DX:E11.65 Patient taking differently: Inject 6-8 Units into the skin 3 (three) times daily with meals. Sliding scale insulin 11/25/18  Yes Elayne Snare, MD  ketoconazole (NIZORAL) 2 % shampoo Apply 1 application topically 2 (two) times a week. Patient taking differently: Apply 1 application topically daily as needed for irritation.  03/31/15  Yes Baxley, Cresenciano Lick, MD  levothyroxine (SYNTHROID, LEVOTHROID) 50 MCG tablet Take 1 tablet (50 mcg total) by mouth daily. Patient taking differently: Take 50 mcg by mouth daily before breakfast.  08/06/14  Yes Baxley, Cresenciano Lick, MD  lidocaine (LIDODERM) 5 % Place 1 patch onto the skin daily as needed (back pain).  11/12/19  Yes [provider]  memantine (NAMENDA) 10 MG tablet Take 10 mg by mouth 2 (two) times daily. 06/29/20  Yes [provider]  midodrine  (PROAMATINE) 10 MG tablet Take one pill prior to hemodialysis on MWF Patient taking differently: Take 10 mg by mouth See admin instructions. Take one tablet (10 mg) by mouth prior to dialysis - Monday, Wednesday, Friday 08/13/18  Yes Love, Ivan Anchors, PA-C  Nutritional Supplements (FEEDING SUPPLEMENT, NEPRO CARB STEADY,) LIQD Take 237 mLs by mouth in the morning and at bedtime.    Yes [provider]  omeprazole (PRILOSEC) 40 MG  capsule Take 1 capsule (40 mg total) by mouth daily. Patient taking differently: Take 40 mg by mouth daily before breakfast.  03/13/18  Yes Esterwood, Amy S, PA-C  sevelamer carbonate (RENVELA) 800 MG tablet Take 1,200-2,400 mg by mouth See admin instructions. Take 3 tablets (2400 mg) by mouth twice daily with a meal & take 2 tablet (1600 mg) by mouth with a snack   Yes [provider]  simvastatin (ZOCOR) 20 MG tablet Take 20 mg by mouth at bedtime.    Yes [provider]  TRADJENTA 5 MG TABS tablet TAKE 1 TABLET DAILY Patient taking differently: Take 5 mg by mouth daily.  03/01/20  Yes Elayne Snare, MD  Blood Glucose Monitoring Suppl (FREESTYLE FREEDOM LITE) w/Device KIT Use to check blood sugar 2 times per day dx code E11.65 10/21/18   Elayne Snare, MD  glucose blood (FREESTYLE LITE) test strip USE THREE TIMES DAILY 01/25/20   Elayne Snare, MD  hydrocortisone (ANUSOL-HC) 25 MG suppository Place 1 suppository (25 mg total) rectally at bedtime. Patient not taking: Reported on 05/17/2020 04/14/20   Zehr, Laban Emperor, PA-C  Insulin Pen Needle (PEN NEEDLES) 32G X 4 MM MISC 1 each by Does not apply route 3 (three) times daily. USE PEN NEEDLES TO INJECT INSULIN THREE TIMES DAILY. 11/25/18   Elayne Snare, MD  Lancets (FREESTYLE) lancets Use as instructed to check blood sugar 2 times per day dx code E11.65 02/22/16   Elayne Snare, MD    Allergies    Ambien [zolpidem tartrate] and Penicillins  Review of Systems   Review of Systems  Constitutional: Negative for chills  and fever.  Respiratory: Negative for shortness of breath.   Cardiovascular: Negative for chest pain.  Gastrointestinal: Positive for abdominal pain and blood in stool. Negative for nausea and vomiting.  Skin: Negative for rash and wound.  Allergic/Immunologic: Positive for immunocompromised state.  Neurological: Positive for dizziness and weakness.  All other systems reviewed and are negative.   Physical Exam Updated Vital Signs BP 103/84    Pulse 83    Temp 98 F (36.7 C) (Oral)    Resp 18    SpO2 100%   Physical Exam Vitals and nursing note reviewed.  Constitutional:      General: He is not in acute distress.    Appearance: He is well-developed. He is not diaphoretic.  HENT:     Head: Normocephalic and atraumatic.  Cardiovascular:     Rate and Rhythm: Normal rate and regular rhythm.     Pulses: Normal pulses.     Heart sounds: Normal heart sounds.  Pulmonary:     Effort: Pulmonary effort is normal.     Breath sounds: Normal breath sounds.  Abdominal:     Palpations: Abdomen is soft.     Tenderness: There is no abdominal tenderness. There is no right CVA tenderness or left CVA tenderness.  Musculoskeletal:     Right lower leg: No edema.     Left lower leg: No edema.  Skin:    General: Skin is warm and dry.     Findings: No erythema or rash.  Neurological:     Mental Status: He is alert and oriented to person, place, and time.  Psychiatric:        Behavior: Behavior normal.     ED Results / Procedures / Treatments   Labs (all labs ordered are listed, but only abnormal results are displayed) Labs Reviewed  CBC WITH DIFFERENTIAL/PLATELET - Abnormal; Notable for  the following components:      Result Value   RBC 3.02 (*)    Hemoglobin 10.8 (*)    HCT 33.5 (*)    MCV 110.9 (*)    MCH 35.8 (*)    RDW 19.0 (*)    nRBC 0.6 (*)    All other components within normal limits  COMPREHENSIVE METABOLIC PANEL - Abnormal; Notable for the following components:   Potassium  3.4 (*)    Glucose, Bld 131 (*)    BUN 30 (*)    Creatinine, Ser 7.84 (*)    Total Protein 6.0 (*)    Albumin 3.0 (*)    Alkaline Phosphatase 201 (*)    GFR, Estimated 7 (*)    Anion gap 16 (*)    All other components within normal limits  CBG MONITORING, ED - Abnormal; Notable for the following components:   Glucose-Capillary 123 (*)    All other components within normal limits  LIPASE, BLOOD  URINALYSIS, ROUTINE W REFLEX MICROSCOPIC  POC OCCULT BLOOD, ED  TYPE AND SCREEN    EKG None  Radiology CT Abdomen Pelvis W Contrast  Result Date: 07/17/2020 CLINICAL DATA:  79 year old male with LEFT-sided abdominal and pelvic pain for 3 days. EXAM: CT ABDOMEN AND PELVIS WITH CONTRAST TECHNIQUE: Multidetector CT imaging of the abdomen and pelvis was performed using the standard protocol following bolus administration of intravenous contrast. CONTRAST:  179m OMNIPAQUE IOHEXOL 300 MG/ML  SOLN COMPARISON:  11/08/2019 CT and prior studies FINDINGS: Lower chest: No acute abnormality.  LEFT basilar scarring noted. Hepatobiliary: The liver is unremarkable except for small stable cysts. The gallbladder is unremarkable. No biliary dilatation. Pancreas: Unremarkable. Spleen: Unremarkable Adrenals/Urinary Tract: Severely atrophic kidneys are again identified with multiple unchanged bilateral low-density renal lesions/cysts. There is no evidence of hydronephrosis. The adrenal glands are unremarkable. The bladder is not well distended. Stomach/Bowel: Stomach is within normal limits. No evidence of bowel wall thickening, distention, or inflammatory changes. Colonic diverticulosis noted without evidence of acute diverticulitis. Vascular/Lymphatic: Aortobifemoral bypass changes noted. Aortic atherosclerotic calcifications are present. There is no evidence of aneurysm. No enlarged lymph nodes are identified. Reproductive: Prostate is unremarkable. Other: No ascites, focal collection or pneumoperitoneum.  Musculoskeletal: No acute or suspicious bony abnormality noted. Remote compression fractures of all lumbar vertebra are again identified with vertebral augmentation changes in L3. IMPRESSION: 1. No evidence of acute abnormality. 2. Colonic diverticulosis without evidence of acute diverticulitis. 3. Severely atrophic kidneys. 4. Aortobifemoral bypass changes and aortic Atherosclerosis (ICD10-I70.0). Electronically Signed   By: JMargarette CanadaM.D.   On: 07/17/2020 17:26    Procedures Procedures (including critical care time)  Medications Ordered in ED Medications  sodium chloride (PF) 0.9 % injection (has no administration in time range)  iohexol (OMNIPAQUE) 300 MG/ML solution 100 mL (100 mLs Intravenous Contrast Given 07/17/20 1642)    ED Course  I have reviewed the triage vital signs and the nursing notes.  Pertinent labs & imaging results that were available during my care of the patient were reviewed by me and considered in my medical decision making (see chart for details).  Clinical Course as of Jul 17 1849  Sun Jul 17, 2020  1847 79yo male with complaint of dark stools, history of GI bleed, left lower abdominal pain. On exam, stool is a dark grey/seedy consistency, negative on hemoccult.  Abdomen is soft and non tender. Labs without significant changes. Hgb 10.8, 11.5 on 07/15/20. CT without evidence of diverticulitis or acute process.  Discussed results with patient and family member at bedside, recommend recheck hgb at dialysis tomorrow, follow up with GI clinic tomorrow. Return to ER for worsening or concerning symptoms. Case discussed with Dr. Rex Kras, ER attending, agrees with plan of care.    [LM]    Clinical Course User Index [LM] Roque Lias   MDM Rules/Calculators/A&P                          Final Clinical Impression(s) / ED Diagnoses Final diagnoses:  Left lower quadrant abdominal pain  Dark stools    Rx / DC Orders ED Discharge Orders    None         Tacy Learn, PA-C 07/17/20 1850    Little, Wenda [place], MD 07/20/20 1531

## 2020-07-17 NOTE — Discharge Instructions (Addendum)
Call your GI clinic tomorrow for follow up. Return to ER for worsening or concerning symptoms. Recheck hemoglobin at dialysis tomorrow.

## 2020-07-18 DIAGNOSIS — Z992 Dependence on renal dialysis: Secondary | ICD-10-CM | POA: Diagnosis not present

## 2020-07-18 DIAGNOSIS — N2581 Secondary hyperparathyroidism of renal origin: Secondary | ICD-10-CM | POA: Diagnosis not present

## 2020-07-18 DIAGNOSIS — N186 End stage renal disease: Secondary | ICD-10-CM | POA: Diagnosis not present

## 2020-07-18 DIAGNOSIS — E119 Type 2 diabetes mellitus without complications: Secondary | ICD-10-CM | POA: Diagnosis not present

## 2020-07-18 DIAGNOSIS — D509 Iron deficiency anemia, unspecified: Secondary | ICD-10-CM | POA: Diagnosis not present

## 2020-07-18 NOTE — Telephone Encounter (Signed)
Called patient's wife back, unable to leave message no voicemail

## 2020-07-18 NOTE — Telephone Encounter (Signed)
Spoke to patient's wife who states that patient continues to have dark stools and abdominal  pain. He went to [facility] ED 07/17/20 for above symptoms. Negative hemoccult HGB 10.8  11.5 on 10/22 CT Negative for diverticulitis or acute process. The wife is very concerned that patients health is declining,and would like to know if there is any further testing to explain why he feels so fatigued with continued abdominal pain. Dr Bryan Lemma please advise.

## 2020-07-18 NOTE — Telephone Encounter (Signed)
[name] is requesting a call back from a nurse to discuss the pt's ED visit he had over the weekend.

## 2020-07-20 ENCOUNTER — Telehealth: Payer: Self-pay | Admitting: Gastroenterology

## 2020-07-20 DIAGNOSIS — D509 Iron deficiency anemia, unspecified: Secondary | ICD-10-CM | POA: Diagnosis not present

## 2020-07-20 DIAGNOSIS — Z992 Dependence on renal dialysis: Secondary | ICD-10-CM | POA: Diagnosis not present

## 2020-07-20 DIAGNOSIS — E119 Type 2 diabetes mellitus without complications: Secondary | ICD-10-CM | POA: Diagnosis not present

## 2020-07-20 DIAGNOSIS — N186 End stage renal disease: Secondary | ICD-10-CM | POA: Diagnosis not present

## 2020-07-20 DIAGNOSIS — N2581 Secondary hyperparathyroidism of renal origin: Secondary | ICD-10-CM | POA: Diagnosis not present

## 2020-07-20 NOTE — Telephone Encounter (Signed)
Spoke to patients wife. Appointment has been scheduled to follow up with Dr Bryan Lemma

## 2020-07-20 NOTE — Telephone Encounter (Signed)
LMOM for patients wife to contact the office to schedule an appointment with Dr Bryan Lemma or Alonza Bogus to discuss ongoing symptoms.

## 2020-07-20 NOTE — Telephone Encounter (Signed)
Chart reviewed from ER evaluation over the weekend. Was hemodynamically stable and stable H/H, within unremarkable CT, so discharged to home. Recommend scheduling follow-up appointment with myself or Alonza Bogus in the clinic along with follow-up with PCM.

## 2020-07-22 DIAGNOSIS — D509 Iron deficiency anemia, unspecified: Secondary | ICD-10-CM | POA: Diagnosis not present

## 2020-07-22 DIAGNOSIS — N2581 Secondary hyperparathyroidism of renal origin: Secondary | ICD-10-CM | POA: Diagnosis not present

## 2020-07-22 DIAGNOSIS — N186 End stage renal disease: Secondary | ICD-10-CM | POA: Diagnosis not present

## 2020-07-22 DIAGNOSIS — Z992 Dependence on renal dialysis: Secondary | ICD-10-CM | POA: Diagnosis not present

## 2020-07-22 DIAGNOSIS — E119 Type 2 diabetes mellitus without complications: Secondary | ICD-10-CM | POA: Diagnosis not present

## 2020-07-25 DIAGNOSIS — N2581 Secondary hyperparathyroidism of renal origin: Secondary | ICD-10-CM | POA: Diagnosis not present

## 2020-07-25 DIAGNOSIS — E119 Type 2 diabetes mellitus without complications: Secondary | ICD-10-CM | POA: Diagnosis not present

## 2020-07-25 DIAGNOSIS — N186 End stage renal disease: Secondary | ICD-10-CM | POA: Diagnosis not present

## 2020-07-25 DIAGNOSIS — E876 Hypokalemia: Secondary | ICD-10-CM | POA: Diagnosis not present

## 2020-07-25 DIAGNOSIS — Z992 Dependence on renal dialysis: Secondary | ICD-10-CM | POA: Diagnosis not present

## 2020-07-25 DIAGNOSIS — E1129 Type 2 diabetes mellitus with other diabetic kidney complication: Secondary | ICD-10-CM | POA: Diagnosis not present

## 2020-07-25 DIAGNOSIS — E1122 Type 2 diabetes mellitus with diabetic chronic kidney disease: Secondary | ICD-10-CM | POA: Diagnosis not present

## 2020-07-27 DIAGNOSIS — N2581 Secondary hyperparathyroidism of renal origin: Secondary | ICD-10-CM | POA: Diagnosis not present

## 2020-07-27 DIAGNOSIS — Z992 Dependence on renal dialysis: Secondary | ICD-10-CM | POA: Diagnosis not present

## 2020-07-27 DIAGNOSIS — E876 Hypokalemia: Secondary | ICD-10-CM | POA: Diagnosis not present

## 2020-07-27 DIAGNOSIS — N186 End stage renal disease: Secondary | ICD-10-CM | POA: Diagnosis not present

## 2020-07-27 DIAGNOSIS — E119 Type 2 diabetes mellitus without complications: Secondary | ICD-10-CM | POA: Diagnosis not present

## 2020-07-28 ENCOUNTER — Encounter: Payer: Self-pay | Admitting: Neurology

## 2020-07-28 ENCOUNTER — Ambulatory Visit (INDEPENDENT_AMBULATORY_CARE_PROVIDER_SITE_OTHER): Payer: Medicare Other | Admitting: Neurology

## 2020-07-28 ENCOUNTER — Other Ambulatory Visit: Payer: Self-pay

## 2020-07-28 VITALS — BP 135/56 | HR 64 | Ht 71.0 in | Wt 167.0 lb

## 2020-07-28 DIAGNOSIS — G309 Alzheimer's disease, unspecified: Secondary | ICD-10-CM

## 2020-07-28 DIAGNOSIS — F0390 Unspecified dementia without behavioral disturbance: Secondary | ICD-10-CM

## 2020-07-28 DIAGNOSIS — I679 Cerebrovascular disease, unspecified: Secondary | ICD-10-CM

## 2020-07-28 MED ORDER — MEMANTINE HCL 10 MG PO TABS
10.0000 mg | ORAL_TABLET | Freq: Two times a day (BID) | ORAL | 3 refills | Status: DC
Start: 2020-07-28 — End: 2021-09-26

## 2020-07-28 MED ORDER — DONEPEZIL HCL 10 MG PO TABS
10.0000 mg | ORAL_TABLET | Freq: Every day | ORAL | 3 refills | Status: DC
Start: 2020-07-28 — End: 2021-09-26

## 2020-07-28 NOTE — Progress Notes (Signed)
[facility]    Provider:  Dr Jaynee Eagles Referring Provider: Elby Showers, MD Primary Care Physician:  Elby Showers, MD  REASON FOR VISIT: Follow-up for SD HISTORY FROM: Patient  HPI 07/28/2020: [name] is a 79 y.o. male here as requested by Elby Showers, MD for cognitive changes.  Past medical history bilateral subdural hematoma status post hematoma evacuation, end-stage renal disease on hemodialysis, anemia, secondary hyperparathyroidism, hypertension with intermittent hypotension, dementia, multiple rib fractures, diabetes mellitus and hypothyroidism, diabetes, b-12 deficiency,sleep apnea.   Patient has a history of cognitive problems, he has been on Aricept prescribed from the [place] since 2017. This isnot new and we have been following him since 2017. MMSE has slowly declined. He is currently on Aricept and Memantine. Last CT of the head showed bilateral stable chronic subdural hematomas, remote lacunar infarcts, chronic small-vessel disease consistent with prior imaging and MRI from 03/2015. From history patient in 2017 reported(with his wife) memory loss in conjunction with worsening medical problems, losing things like his cell phone, forgetting to close cabinets, forgetting people's names and appointments, needing help with medication, misplacing items like checks, no delusions or hallucinations. He has has uncontrolled medical problems in the past such as uncontrolled diabetes, he is on dialysis, he has had strokes as well as chronic subdural hematomas. His last hgba1c was 5.3(previously uncontrolled), TSH 03/2020 was normal, last B12 10/2018 was 445.  Wife is concerned about his short-term memory, he will forget what day and year it is, worse after the fall in 2019 after surgery but ongoing for many years prior to 2017. He can remember the things from the past, mom dies early at 56, father lived until the age of 75 and started having some memory problems. He has lost  weight and doesn't snore. We discussed dementia, options for evaluation, treatments. Answered all questions. Marland Kitchen   HPI 07/30/2019:  [name] is a 79 y.o. male here as requested by Pearson Grippe, MD  for subdural hematoma.  Past medical history bilateral subdural hematoma status post hematoma evacuation, end-stage renal disease on hemodialysis, anemia, secondary hyperparathyroidism, hypertension with intermittent hypotension, dementia, multiple rib fractures, diabetes mellitus and hypothyroidism.  Patient is status post subdural evacuation July 22, 2018.  Here with his wife, he has headaches in the morning, improving, across the forehead, after dialysis.wakes with headaches every morning and then it gets better. He sleeps in a hospital bed and raises the head of the bed. He is very active overnight, he fights with his sheets. Tylenol helps. Cognitive decline.  Reviewed notes, labs and imaging from outside physicians, which showed:  I reviewed the referring providers notes from [facility], patient had a subdural hematoma on 07/29/2018, on hemodialysis Monday Wednesday Friday, no heparin is currently being used, patient keeps clotting even with other interventions, can patient have a light dose of heparin during treatment.  Patient had bilateral subdural hematoma evacuation.  07/30/2017: Patient is stable, he sleeps ok, he was having a lot of stress in the past when he saw [place] with medical issues. He is going through dialysis, heels his memory issues are stable. Discussed his memory, MMSE 28/30,  exam is stable today. If memory loss is progressive refer him to neurocognitive testing at this point we'll monitor clinically.  UPDATE 10/18/2017CM [name], 79 year old male returns for follow-up. He has history of mild cognitive. MOCA is stable. He is currently on Aricept denies side effects to the medication. He gets his medications through  the [place]. He goes to dialysis 3 times a week. He  continues to be fairly active. He is planning a vacation to [place] next month. He feels his memory is stable He continues to drive without difficulty. He says he has not gotten lost. He returns for reevaluation    HISTORY OF PRESENT ILLNESS:UPDATE 5/ 3/ 2018CM [name], 79 year old male returns for follow-up with history of mild cognitive impairment. He is currently on Aricept 10 mg daily through the [place] without side effects. He continues to go to dialysis 3 times a week. He continues to be active. He is going to [place] 2 weeks to see his granddaughter get married. He has had no issues with driving. His  MOCA score is stable. He returns for reevaluation. Memory continues to decline.   UPDATE 10/18/2017CM [name], 79 year old male returns for follow-up. He has history of mild cognitive. MOCA is stable. He is currently on Aricept denies side effects to the medication. He gets his medications through the [place]. He goes to dialysis 3 times a week. He continues to be fairly active. He is planning a vacation to [place] next month. He feels his memory is stable He continues to drive without difficulty. He says he has not gotten lost. He returns for reevaluation   Interval history: 01/12/16 AAPatient no-showed for multiple (3) appointments and was discharged from our practice. I spoke to him and agreed to see him again wit the understanding that if he misses another appointment he will need to find another neurology group. He has been going through a lot recently, trying to get disability and his health has declined. He says he has too many appointments. He goes to Dr. Renold Genta and also goes to the [place]. He can follow with Dr. Renold Genta, doesn't need to follow with [place] unless he needs [place] if he feels he has too many doctors. He takes 671m of asa a day, not sure why, advised him this is not indicated for stroke prevention. 874mdaily. 65049maily may increase his risk of bleeding. He is on Aricept 53m61mily and we  will refill this today. Last hgba1c 7.2. LDL 33. TSH wnl. B12 in July 2016 was 1068 (Hx of b12 deficiency). Memory is stable, in fact he is feeling a little better.   HPI: WillDONIVAN THAMMAVONGa 73 y53. male here as a referral from Dr. BaxlRenold Genta memory problems. PMHx peripheral vascular disease, hypertension, hypothyroidism, hyperparathyroidism, diabetes(uncontrolled since starting dialysis), end-stage renal disease on dialysis, hyperlipidemia, b-12 deficiency on injections (B12 237 at that time). Recently he has noticed he can't catch onto things as quickly as he used to. The latter part of may her noticed it, when he was having more medical issues and he was gaining fluid and had a bout of pneumonia. His memory got really bad before he went into the hospital and was started on dialysis. Things are better since then. He is more alert and catching things better. Wife is here and provides much information, she says he can't keep up with his cell phone and keys and he loses them. He goes into the kitchen to make a snack and he forgets to close the cabinets. He is forgetting people's names, appointments. He used to always be able to monitor medications and he doesn't remember what he is taking his meds for now. He used to be very sharp with this meds but there is a big change. He misplaces checks. He does pay the bills. No  accidents with the car or at home. No delusions or hallucinations. No changes in personality. He is more anxious because of the medical problems. Wife endorses depression. He still gets out and likes to do things, but the side effects of his medical conditions are hard for him to handle. He is frustrated. No loss of consciousness, no episodes of confusion, no staring spells. No other focal neurologic deficits. He worries a lot. Endorses snoring, excessive daytime fatigue and witnessed apneic events.       Review of Systems: Patient complains of symptoms per HPI as well as the following  symptoms: fatigue, memory loss, fall . Pertinent negatives and positives per HPI. All others negative    Social History   Socioeconomic History  . Marital status: Married    Spouse name: [name] Mood  . Number of children: 1  . Years of education: 21  . Highest education level: Not on file  Occupational History  . Occupation: Retired  Tobacco Use  . Smoking status: Former Smoker    Quit date: 09/24/1994    Years since quitting: 25.8  . Smokeless tobacco: Never Used  Vaping Use  . Vaping Use: Never used  Substance and Sexual Activity  . Alcohol use: No    Alcohol/week: 0.0 standard drinks    Comment: Quit Oct. 1977 ("somewhat heavy")  . Drug use: No  . Sexual activity: Not on file  Other Topics Concern  . Not on file  Social History Narrative   Lives at home with wife.   Caffeine use: Drinks no tea, "a little soda"   Drinks 1 cup coffee/week   Right handed   Social Determinants of Health   Financial Resource Strain:   . Difficulty of Paying Living Expenses: Not on file  Food Insecurity:   . Worried About Charity fundraiser in the Last Year: Not on file  . Ran Out of Food in the Last Year: Not on file  Transportation Needs:   . Lack of Transportation (Medical): Not on file  . Lack of Transportation (Non-Medical): Not on file  Physical Activity:   . Days of Exercise per Week: Not on file  . Minutes of Exercise per Session: Not on file  Stress:   . Feeling of Stress : Not on file  Social Connections:   . Frequency of Communication with Friends and Family: Not on file  . Frequency of Social Gatherings with Friends and Family: Not on file  . Attends Religious Services: Not on file  . Active Member of Clubs or Organizations: Not on file  . Attends Archivist Meetings: Not on file  . Marital Status: Not on file  Intimate Partner Violence:   . Fear of Current or Ex-Partner: Not on file  . Emotionally Abused: Not on file  . Physically Abused: Not on file  .  Sexually Abused: Not on file    Family History  Problem Relation Age of Onset  . Aneurysm Mother        brain  . Heart disease Father   . Stroke Father   . Hypertension Father   . Diabetes Father   . Dementia Neg Hx   . Colon cancer Neg Hx   . Esophageal cancer Neg Hx   . Pancreatic cancer Neg Hx   . Prostate cancer Neg Hx   . Rectal cancer Neg Hx   . Stomach cancer Neg Hx     Past Medical History:  Diagnosis Date  . Allergy   .  Anemia   . Arthritis   . Cataract    bil cateracts removed  . Coronary artery disease   . Dementia arising in the senium and presenium ([place])   . Diabetes mellitus    Type 2  . Diverticulitis   . ED (erectile dysfunction)   . Elevated homocysteine   . ESRD (end stage renal disease) on dialysis ([place]) 03/2015  . GERD (gastroesophageal reflux disease)    pepto   . Gout   . Hiatal hernia   . Hyperlipidemia   . Hypertension   . Hypothyroidism   . Pneumonia   . PVD (peripheral vascular disease) ([place])    has plastic aorta  . Renal insufficiency   . Seasonal allergies   . Sleep apnea    does not wear c-pap    Past Surgical History:  Procedure Laterality Date  . aortobifemoral bypass    . AV FISTULA PLACEMENT Left 12/01/2013   Procedure: ARTERIOVENOUS (AV) FISTULA CREATION- LEFT BRACHIOCEPHALIC;  Surgeon: Angelia Mould, MD;  Location: [place];  Service: Vascular;  Laterality: Left;  . [place] TRANSPOSITION Right 07/27/2014   Procedure: BASCILIC VEIN TRANSPOSITION;  Surgeon: Angelia Mould, MD;  Location: [place];  Service: Vascular;  Laterality: Right;  . BRAIN SURGERY  07/22/2018  . BREAST SURGERY     left - granulomatous mastitis  . BURR HOLE Bilateral 07/22/2018   Procedure: BILATERAL BURR HOLES;  Surgeon: Kristeen Miss, MD;  Location: [place];  Service: Neurosurgery;  Laterality: Bilateral;  . COLONOSCOPY    . 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 ENTEROSCOPY N/A 06/03/2018   Procedure: ENTEROSCOPY;   Surgeon: Lavena Bullion, DO;  Location: [place];  Service: Gastroenterology;  Laterality: N/A;  . ENTEROSCOPY N/A 11/20/2018   Procedure: ENTEROSCOPY;  Surgeon: Rush Landmark Telford Nab., MD;  Location: [facility] ENDOSCOPY;  Service: Gastroenterology;  Laterality: N/A;  . ENTEROSCOPY N/A 12/03/2019   Procedure: ENTEROSCOPY;  Surgeon: Lavena Bullion, DO;  Location: [facility] ENDOSCOPY;  Service: Gastroenterology;  Laterality: N/A;  push enteroscopy  . EYE SURGERY Bilateral    cataracts  . HEMODIALYSIS INPATIENT  01/17/2018      . HOT HEMOSTASIS N/A 06/03/2018   Procedure: HOT HEMOSTASIS (ARGON PLASMA COAGULATION/BICAP);  Surgeon: Lavena Bullion, DO;  Location: [facility] ENDOSCOPY;  Service: Gastroenterology;  Laterality: N/A;  . HOT HEMOSTASIS N/A 11/20/2018   Procedure: HOT HEMOSTASIS (ARGON PLASMA COAGULATION/BICAP);  Surgeon: Irving Copas., MD;  Location: [place];  Service: Gastroenterology;  Laterality: N/A;  . HOT HEMOSTASIS N/A 12/03/2019   Procedure: HOT HEMOSTASIS (ARGON PLASMA COAGULATION/BICAP);  Surgeon: Lavena Bullion, DO;  Location: [facility] ENDOS[facility];  Service: Gastroenterology;  Laterality: N/A;  . REVISION OF ARTERIOVENOUS GORETEX GRAFT Right 09/01/2019   Procedure: REVISION OF ARTERIOVENOUS FISTULA RIGHT ARM;  Surgeon: Serafina Mitchell, MD;  Location: [facility];  Service: Vascular;  Laterality: Right;  . REVISON OF ARTERIOVENOUS FISTULA Left 02/09/2014   Procedure: REVISON OF LEFT ARTERIOVENOUS FISTULA - RESECTION OF RENDUNDANT VEIN;  Surgeon: Angelia Mould, MD;  Location: [place];  Service: Vascular;  Laterality: Left;  . SBO with lysis adhesions    . SHUNTOGRAM Left 04/19/2014   Procedure: FISTULOGRAM;  Surgeon: Angelia Mould, MD;  Location: [facility] CATH LAB;  Service: Cardiovascular;  Laterality: Left;  . UNILATERAL UPPER EXTREMEITY ANGIOGRAM N/A 07/12/2014   Procedure: UNILATERAL UPPER Anselmo Rod;  Surgeon: Angelia Mould, MD;  Location: [facility] CATH LAB;   Service: Cardiovascular;  Laterality: N/A;    Current Outpatient Medications  Medication Sig Dispense Refill  . acetaminophen (TYLENOL) 325 MG tablet Take 1-2 tablets (325-650 mg total) by mouth every 4 (four) hours as needed for mild pain. (Patient taking differently: Take 650 mg by mouth in the morning and at bedtime. )    . albuterol (VENTOLIN HFA) 108 (90 Base) MCG/ACT inhaler Inhale 1 puff into the lungs daily as needed for wheezing or shortness of breath.    . allopurinol (ZYLOPRIM) 100 MG tablet Take 100 mg by mouth daily.     . Blood Glucose Monitoring Suppl (FREESTYLE FREEDOM LITE) w/Device KIT Use to check blood sugar 2 times per day dx code E11.65 1 each 0  . calcium elemental as carbonate (TUMS ULTRA 1000) 400 MG chewable tablet Chew 1,000 mg by mouth 2 (two) times daily.    . camphor-menthol (SARNA) lotion Apply 1 application topically 4 (four) times daily as needed for itching.     . Carboxymethylcellulose Sodium (THERATEARS) 0.25 % SOLN Place 1 drop into both eyes daily as needed (dry eyes).    . cetirizine (ZYRTEC) 10 MG chewable tablet Chew 10 mg by mouth daily as needed (allergies.).     Marland Kitchen clobetasol (TEMOVATE) 0.05 % external solution Apply 1 application topically 2 (two) times daily as needed (scalp irritation.).   0  . denosumab (PROLIA) 60 MG/ML SOSY injection Inject 60 mg into the skin every 6 (six) months.    . donepezil (ARICEPT) 10 MG tablet Take 1 tablet (10 mg total) by mouth at bedtime. 90 tablet 3  . FLUOCINOLONE ACETONIDE SCALP 0.01 % OIL Apply 1 application topically daily as needed (scalp irritation.).   0  . fluticasone (FLONASE) 50 MCG/ACT nasal spray Place 1 spray into both nostrils daily. (Patient taking differently: Place 1 spray into both nostrils daily as needed for allergies. ) 16 g 2  . glucose blood (FREESTYLE LITE) test strip USE THREE TIMES DAILY 300 strip 2  . hydrocortisone 2.5 % cream Apply 1 application topically 3 (three) times daily as needed  (skin irritation (legs & arms)).     Marland Kitchen insulin aspart (NOVOLOG FLEXPEN) 100 UNIT/ML FlexPen INJECT 6-8 UNITS UNDER THE SKIN THREE TIMES DAILY BEFORE MEALS. DX:E11.65 (Patient taking differently: Inject 6-8 Units into the skin 3 (three) times daily with meals. Sliding scale insulin) 15 mL 12  . Insulin Pen Needle (PEN NEEDLES) 32G X 4 MM MISC 1 each by Does not apply route 3 (three) times daily. USE PEN NEEDLES TO INJECT INSULIN THREE TIMES DAILY. 100 each 12  . ketoconazole (NIZORAL) 2 % shampoo Apply 1 application topically 2 (two) times a week. (Patient taking differently: Apply 1 application topically daily as needed for irritation. ) 120 mL 0  . Lancets (FREESTYLE) lancets Use as instructed to check blood sugar 2 times per day dx code E11.65 100 each 3  . levothyroxine (SYNTHROID, LEVOTHROID) 50 MCG tablet Take 1 tablet (50 mcg total) by mouth daily. (Patient taking differently: Take 50 mcg by mouth daily before breakfast. ) 90 tablet 1  . lidocaine (LIDODERM) 5 % Place 1 patch onto the skin daily as needed (back pain).     . memantine (NAMENDA) 10 MG tablet Take 1 tablet (10 mg total) by mouth 2 (two) times daily. 180 tablet 3  . midodrine (PROAMATINE) 10 MG tablet Take one pill prior to hemodialysis on MWF (Patient taking differently: Take 10 mg by mouth See admin instructions. Take one tablet (10 mg)  by mouth prior to dialysis - Monday, Wednesday, Friday)    . Nutritional Supplements (FEEDING SUPPLEMENT, NEPRO CARB STEADY,) LIQD Take 237 mLs by mouth in the morning and at bedtime.     Marland Kitchen omeprazole (PRILOSEC) 40 MG capsule Take 1 capsule (40 mg total) by mouth daily. (Patient taking differently: Take 40 mg by mouth daily before breakfast. ) 30 capsule 0  . sevelamer carbonate (RENVELA) 800 MG tablet Take 1,200-2,400 mg by mouth See admin instructions. Take 3 tablets (2400 mg) by mouth twice daily with a meal & take 2 tablet (1600 mg) by mouth with a snack    . simvastatin (ZOCOR) 20 MG tablet Take  20 mg by mouth at bedtime.     . TRADJENTA 5 MG TABS tablet TAKE 1 TABLET DAILY (Patient taking differently: Take 5 mg by mouth daily. ) 90 tablet 3   No current facility-administered medications for this visit.    Allergies as of 07/28/2020 - Review Complete 07/28/2020  Allergen Reaction Noted  . Ambien [zolpidem tartrate] Other (See Comments) 11/21/2018  . Penicillins Rash     Vitals: BP (!) 135/56 (BP Location: Left Arm, Patient Position: Sitting)   Pulse 64   Ht _0  (1.803 m)   Wt 167 lb (75.8 kg)   BMI 23.29 kg/m  Last Weight:  Wt Readings from Last 1 Encounters:  07/28/20 167 lb (75.8 kg)   Last Height:   Ht Readings from Last 1 Encounters:  07/28/20 _1  (1.803 m)    MMSE - Mini Mental State Exam 07/28/2020 07/30/2019 05/06/2018  Orientation to time _2 Orientation to Place _3 Registration _4 Attention/ Calculation 0 3 4  Recall 0 2 1  Language- name 2 objects _5 Language- repeat 0 0 1  Language- follow 3 step command _6 Language- read & follow direction _7 Write a sentence 0 1 1  Copy design 0 0 1  Total score _8 Montreal Cognitive Assessment  01/24/2017 07/11/2016 05/20/2015  Visuospatial/ Executive (0/5) _9 Naming (0/3) _10 Attention: Read list of digits (0/2) _11 Attention: Read list of letters (0/1) _12 Attention: Serial 7 subtraction starting at 100 (0/3) _13 Language: Repeat phrase (0/2) _14 Language : Fluency (0/1) 1 0 0  Abstraction (0/2) _15 Delayed Recall (0/5) _16 Orientation (0/6) _17 Total _18 Adjusted Score (based on education) - - 22   Exam: NAD, pleasant                  Speech:    Speech is normal; fluent and spontaneous Cognition:    See MMSE    Cranial Nerves:    The pupils are equal, round, and reactive to light.Trigeminal sensation is intact. The face is symmetric.  Hearing intact. Voice is normal. Shoulder shrug is normal. The tongue has normal motion .    Coordination:  No dysmetria or ataxia noted  Gait: Wide based and shuffling  Motor Observation:    No asymmetry, no atrophy, and no involuntary movements noted.     Strength:    Strength is symmetrical in the upper and lower limbs. No focal deficits appreciated     Sensation: intact to LT     Assessment/Plan:  79 y.o. male here as requested by Elby Showers, MD for cognitive changes.  Past medical history bilateral subdural hematoma status post hematoma evacuation, end-stage renal disease on hemodialysis, anemia, secondary hyperparathyroidism, hypertension with intermittent hypotension, dementia, multiple rib fractures, diabetes mellitus and hypothyroidism, diabetes, b-12 deficiency,sleep apnea.   Patient has a history of cognitive problems, he has been on Aricept prescribed from the [place] since 2017.  MMSE has slowly declined now 17/30. He is currently on Aricept and Namenda.  This may be ALzheimer's disease however cannot rule out dementia due to general medical conditions and cerebrovascular disease as well as head trauma. FDG PET scan would help to differentiate and diagnose as it is 90+% sensitive for Alzheimer's disease. Will also send to formal memory testing.  MRI of the brain for reversible causes Blood work today  Orders Placed This Encounter  Procedures  . MR BRAIN W WO CONTRAST  . NM PET Metabolic Brain  . B12 and Folate Panel  . Methylmalonic acid, serum  . Homocysteine  . Vitamin B1  . Basic Metabolic Panel  . Ambulatory referral to Neuropsychology   Meds ordered this encounter  Medications  . memantine (NAMENDA) 10 MG tablet    Sig: Take 1 tablet (10 mg total) by mouth 2 (two) times daily.    Dispense:  180 tablet    Refill:  3  . donepezil (ARICEPT) 10 MG tablet    Sig: Take 1 tablet (10 mg total) by mouth at bedtime.    Dispense:  90 tablet    Refill:  Kane, MD  [facility] [address] [place], [place] [address]  Phone [phone] Fax [phone]  Cc: Emeline General MD, Pearson Grippe, MD  I spent over 60 minutes of face-to-face and non-face-to-face time with patient on the  1. Dementia without behavioral disturbance, unspecified dementia type ([place])   2. Alzheimer's disease, unspecified (CODE) ([place])   3. Cerebrovascular disease    diagnosis.  This included previsit chart review, lab review, study review, order entry, electronic health record documentation, patient education on the different diagnostic and therapeutic options, counseling and coordination of care, risks and benefits of management, compliance, or risk factor reduction

## 2020-07-28 NOTE — Patient Instructions (Addendum)
Formal memory testing Blood work FDG PET Scan    Dementia Dementia is a condition that affects the way the brain functions. It often affects memory and thinking. Usually, dementia gets worse with time and cannot be reversed (progressive dementia). There are many types of dementia, including:  Alzheimer's disease. This type is the most common.  Vascular dementia. This type may happen as the result of a stroke.  Lewy body dementia. This type may happen to people who have Parkinson's disease.  Frontotemporal dementia. This type is caused by damage to nerve cells (neurons) in certain parts of the brain. Some people may be affected by more than one type of dementia. This is called mixed dementia. What are the causes? Dementia is caused by damage to cells in the brain. The area of the brain and the types of cells damaged determine the type of dementia. Usually, this damage is irreversible or cannot be undone. Some examples of irreversible causes include:  Conditions that affect the blood vessels of the brain, such as diabetes, heart disease, or blood vessel disease.  Genetic mutations. In some cases, changes in the brain may be caused by another condition and can be reversed or slowed. Some examples of reversible causes include:  Injury to the brain.  Certain medicines.  Infection, such as meningitis.  Metabolic problems, such as vitamin B12 deficiency or thyroid disease.  Pressure on the brain, such as from a tumor or blood clot. What are the signs or symptoms? Symptoms of dementia depend on the type of dementia. Common signs of dementia include problems with remembering, thinking, problem solving, decision making, and communicating. These signs develop slowly or get worse with time. This may include:  Problems remembering things.  Having trouble taking a bath or putting clothes on.  Forgetting appointments.  Forgetting to pay bills.  Difficulty planning and preparing  meals.  Having trouble speaking.  Getting lost easily. How is this diagnosed? This condition is diagnosed by a specialist (neurologist). It is diagnosed based on the history of your symptoms, your medical history, a physical exam, and tests. Tests may include:  Tests to evaluate brain function, such as memory tests, cognitive tests, and other tests.  Lab tests, such as blood or urine tests.  Imaging tests, such as a CT scan, a PET scan, or an MRI.  Genetic testing. This may be done if other family members have a diagnosis of certain types of dementia. Your health care provider will talk with you and your family, friends, or caregivers about your history and symptoms. How is this treated?  Treatment for this condition depends on the cause of the dementia. Progressive dementias, such as Alzheimer's disease, cannot be cured, but there may be treatments that help to manage symptoms. Treatment might involve taking medicines that may help to:  Control the dementia.  Slow down the progression of the dementia.  Manage symptoms. In some cases, treating the cause of your dementia can improve symptoms, reverse symptoms, or slow down how quickly your dementia becomes worse. Your health care provider can direct you to support groups, organizations, and other health care providers who can help with decisions about your care. Follow these instructions at home: Medicines  Take over-the-counter and prescription medicines only as told by your health care provider.  Use a pill organizer or pill reminder to help you manage your medicines.  Avoid taking medicines that can affect thinking, such as pain medicines or sleeping medicines. Lifestyle  Make healthy lifestyle choices. ? Be  physically active as told by your health care provider. ? Do not use any products that contain nicotine or tobacco, such as cigarettes, e-cigarettes, and chewing tobacco. If you need help quitting, ask your health care  provider. ? Do not drink alcohol. ? Practice stress-management techniques when you get stressed. ? Spend time with other people.  Make sure to get quality sleep. These tips can help you get a good night's rest: ? Avoid napping during the day. ? Keep your sleeping area dark and cool. ? Avoid exercising during the few hours before you go to bed. ? Avoid caffeine products in the evening. Eating and drinking  Drink enough fluid to keep your urine pale yellow.  Eat a healthy diet. General instructions   Work with your health care provider to determine what you need help with and what your safety needs are.  Talk with your health care provider about whether it is safe for you to drive.  If you were given a bracelet that identifies you as a person with memory loss or tracks your location, make sure to wear it at all times.  Work with your family to make important decisions, such as advance directives, medical power of attorney, or a living will.  Keep all follow-up visits as told by your health care provider. This is important. Where to find more information  Alzheimer's Association: CapitalMile.co.nz  National Institute on Aging: DVDEnthusiasts.nl  World Health Organization: RoleLink.com.br Contact a health care provider if:  You have any new or worsening symptoms.  You have problems with choking or swallowing. Get help right away if:  You feel depressed or sad, or feel that you want to harm yourself.  Your family members become concerned for your safety. If you ever feel like you may hurt yourself or others, or have thoughts about taking your own life, get help right away. You can go to your nearest emergency department or call:  Your local emergency services (911 in the [place]  A suicide crisis helpline, such as the [facility] at 564-076-5792. This is open 24 hours a day. Summary  Dementia is a condition that affects the way the brain  functions. Dementia often affects memory and thinking.  Usually, dementia gets worse with time and cannot be reversed (progressive dementia).  Treatment for this condition depends on the cause of the dementia.  Work with your health care provider to determine what you need help with and what your safety needs are.  Your health care provider can direct you to support groups, organizations, and other health care providers who can help with decisions about your care. This information is not intended to replace advice given to you by your health care provider. Make sure you discuss any questions you have with your health care provider. Document Revised: 11/25/2018 Document Reviewed: 11/25/2018 Elsevier Patient Education  [place].

## 2020-07-29 ENCOUNTER — Telehealth: Payer: Self-pay | Admitting: Neurology

## 2020-07-29 DIAGNOSIS — N2581 Secondary hyperparathyroidism of renal origin: Secondary | ICD-10-CM | POA: Diagnosis not present

## 2020-07-29 DIAGNOSIS — Z992 Dependence on renal dialysis: Secondary | ICD-10-CM | POA: Diagnosis not present

## 2020-07-29 DIAGNOSIS — E876 Hypokalemia: Secondary | ICD-10-CM | POA: Diagnosis not present

## 2020-07-29 DIAGNOSIS — N186 End stage renal disease: Secondary | ICD-10-CM | POA: Diagnosis not present

## 2020-07-29 DIAGNOSIS — E119 Type 2 diabetes mellitus without complications: Secondary | ICD-10-CM | POA: Diagnosis not present

## 2020-07-29 NOTE — Telephone Encounter (Signed)
Candice @ [employer] calling to give STAT results, she is asking to be called back at [phone] option 1

## 2020-07-29 NOTE — Telephone Encounter (Signed)
Received phone message from [place] - this hemodialysis patient's glucose level yesterday was abnormally low- 18 -. He was given a snack by his wife, who recognized the confusion related to low sugar.  Blood sample was drawn on 07-28-2020, at 13.51 PM. [birth year] birthday !   I called the patient's wife and she stated he did well for the rest of the day after eating a snack .CD  Cc to Dr Renold Genta

## 2020-07-29 NOTE — Telephone Encounter (Signed)
Called back and spoke with Butch Penny. Reports that labs completed yesterday showed:  Glucose 65 - 99 mg/dL 18Low Panic   Comment: **Verified by repeat analysis**  Specimen received in contact with cells. No visible hemolysis  present. However GLUC may be decreased and K increased. Clinical  correlation indicated.

## 2020-08-01 ENCOUNTER — Telehealth: Payer: Self-pay | Admitting: Neurology

## 2020-08-01 DIAGNOSIS — N186 End stage renal disease: Secondary | ICD-10-CM | POA: Diagnosis not present

## 2020-08-01 DIAGNOSIS — E119 Type 2 diabetes mellitus without complications: Secondary | ICD-10-CM | POA: Diagnosis not present

## 2020-08-01 DIAGNOSIS — N2581 Secondary hyperparathyroidism of renal origin: Secondary | ICD-10-CM | POA: Diagnosis not present

## 2020-08-01 DIAGNOSIS — Z992 Dependence on renal dialysis: Secondary | ICD-10-CM | POA: Diagnosis not present

## 2020-08-01 DIAGNOSIS — E876 Hypokalemia: Secondary | ICD-10-CM | POA: Diagnosis not present

## 2020-08-01 NOTE — Telephone Encounter (Signed)
Medicare/tricare order sent to [facility]. No auth they will reach out to the patient to schedule.  

## 2020-08-02 ENCOUNTER — Other Ambulatory Visit (INDEPENDENT_AMBULATORY_CARE_PROVIDER_SITE_OTHER): Payer: Medicare Other

## 2020-08-02 ENCOUNTER — Other Ambulatory Visit: Payer: Self-pay

## 2020-08-02 ENCOUNTER — Encounter: Payer: Self-pay | Admitting: Psychology

## 2020-08-02 DIAGNOSIS — Z794 Long term (current) use of insulin: Secondary | ICD-10-CM | POA: Diagnosis not present

## 2020-08-02 DIAGNOSIS — E1165 Type 2 diabetes mellitus with hyperglycemia: Secondary | ICD-10-CM

## 2020-08-02 DIAGNOSIS — N2581 Secondary hyperparathyroidism of renal origin: Secondary | ICD-10-CM

## 2020-08-02 LAB — BASIC METABOLIC PANEL
BUN/Creatinine Ratio: 4 — ABNORMAL LOW (ref 10–24)
BUN: 26 mg/dL (ref 8–27)
CO2: 32 mmol/L — ABNORMAL HIGH (ref 20–29)
Calcium: 8.2 mg/dL — ABNORMAL LOW (ref 8.6–10.2)
Chloride: 102 mmol/L (ref 96–106)
Creatinine, Ser: 6.28 mg/dL — ABNORMAL HIGH (ref 0.76–1.27)
GFR calc Af Amer: 9 mL/min/{1.73_m2} — ABNORMAL LOW (ref >59)
GFR calc non Af Amer: 8 mL/min/{1.73_m2} — ABNORMAL LOW (ref >59)
Glucose: 18 mg/dL — CL (ref 65–99)
Potassium: 3.8 mmol/L (ref 3.5–5.2)
Sodium: 150 mmol/L — ABNORMAL HIGH (ref 134–144)

## 2020-08-02 LAB — HOMOCYSTEINE: Homocysteine: 21.1 umol/L — ABNORMAL HIGH (ref 0.0–19.2)

## 2020-08-02 LAB — GLUCOSE, RANDOM: Glucose, Bld: 96 mg/dL (ref 70–99)

## 2020-08-02 LAB — METHYLMALONIC ACID, SERUM: Methylmalonic Acid: 1286 nmol/L — ABNORMAL HIGH (ref 0–378)

## 2020-08-02 LAB — B12 AND FOLATE PANEL
Folate: 10.5 ng/mL (ref >3.0)
Vitamin B-12: 722 pg/mL (ref 232–1245)

## 2020-08-02 LAB — CALCIUM: Calcium: 7.9 mg/dL — ABNORMAL LOW (ref 8.4–10.5)

## 2020-08-02 LAB — VITAMIN B1: Thiamine: 224.3 nmol/L — ABNORMAL HIGH (ref 66.5–200.0)

## 2020-08-03 DIAGNOSIS — E876 Hypokalemia: Secondary | ICD-10-CM | POA: Diagnosis not present

## 2020-08-03 DIAGNOSIS — E119 Type 2 diabetes mellitus without complications: Secondary | ICD-10-CM | POA: Diagnosis not present

## 2020-08-03 DIAGNOSIS — N186 End stage renal disease: Secondary | ICD-10-CM | POA: Diagnosis not present

## 2020-08-03 DIAGNOSIS — Z992 Dependence on renal dialysis: Secondary | ICD-10-CM | POA: Diagnosis not present

## 2020-08-03 DIAGNOSIS — N2581 Secondary hyperparathyroidism of renal origin: Secondary | ICD-10-CM | POA: Diagnosis not present

## 2020-08-04 ENCOUNTER — Encounter: Payer: Self-pay | Admitting: Endocrinology

## 2020-08-04 ENCOUNTER — Ambulatory Visit (INDEPENDENT_AMBULATORY_CARE_PROVIDER_SITE_OTHER): Payer: Medicare Other | Admitting: Endocrinology

## 2020-08-04 ENCOUNTER — Other Ambulatory Visit: Payer: Self-pay

## 2020-08-04 VITALS — BP 136/64 | HR 63 | Ht 71.0 in | Wt 165.6 lb

## 2020-08-04 DIAGNOSIS — M810 Age-related osteoporosis without current pathological fracture: Secondary | ICD-10-CM | POA: Diagnosis not present

## 2020-08-04 DIAGNOSIS — E1165 Type 2 diabetes mellitus with hyperglycemia: Secondary | ICD-10-CM | POA: Diagnosis not present

## 2020-08-04 LAB — FRUCTOSAMINE: Fructosamine: 295 umol/L — ABNORMAL HIGH (ref 205–285)

## 2020-08-04 NOTE — Patient Instructions (Signed)
Take 5 Units in am  Bedtime snack

## 2020-08-04 NOTE — Progress Notes (Signed)
Patient ID: [name], male   DOB: 05-21-1941, 79 y.o.   MRN: [id number]           Reason for Appointment:  Follow-up for endocrinology problems   History of Present Illness:          Date of diagnosis of type 2 diabetes mellitus:  1996      Background history:  He has had long-standing diabetes probably treated with metformin initially and subsequently with sulfonylurea drugs At some point he was also given Actos in addition to his glipizide which he is still taking Records of his control are only available for the last 4 years not on Over the last few years his A1c has been generally in the 6-7% range His A1c was 6.2 in May 2016 and apparently was starting to get low normal blood sugars at times He was admitted to the hospital for fluid overload and renal failure and at that time his Actos was stopped Subsequently his blood sugars had been markedly increased He was switched from regular insulin to NovoLog on his initial consultation  Recent history:   INSULIN regimen: NovoLog 6 units with breakfast and usually 6 units before dinner Oral hypoglycemic drugs: Tradjenta 5 mg daily  His A1c tends to be falsely low, last 5.3 compared to 5.4  Fructosamine in relatively better at 295 compared to 322   Current blood sugar patterns and problems identified:  His wife is managing his diabetes including glucose monitoring and insulin regimen  His fasting blood sugars appear to be low normal and not clear why  He is usually eating a small dessert like a popsicle around 8 PM but no bedtime snack  He does not think his appetite is significantly decreased but his weight is apparently well over the last couple of months  Generally taking about the same insulin dose at breakfast and dinnertime  No hypoglycemia, recent lowest blood sugar is 74  Also is continuing on Tradjenta  Still is having some weakness and not able to exercise any   Dialysis is done at 12 pm, doing it on  Monday/Wednesday/Fridays     Side effects from medications have been: None  Compliance with the medical regimen: Fair  Glucose monitoring:  done up to 3  times a day         Glucometer:  Freestyle  Blood Glucose readings by review of monitor Download and averages:   PRE-MEAL Fasting Lunch Dinner Bedtime Overall  Glucose range:  79-94   74-198    Mean/median: ?  90  ?  120   117   POST-MEAL PC Breakfast PC Lunch PC Dinner  Glucose range:  115-184   137-198  Mean/median:      Previous data:  PRE-MEAL Fasting Lunch Dinner Bedtime Overall  Glucose range:    112-159   81-180  Mean/median:  97   138   117   POST-MEAL PC Breakfast PC Lunch PC Dinner  Glucose range:    81-180  Mean/median:    128      Self-care: The diet that the patient has been following is: tries to limit sweets, portions. eating out only about once a week at a steak house        Typical meal intake: Breakfast is variable, ham biscuit; supper 7-8 pm            Dietician visit, most recent: 08/2017   Weight history:   Wt Readings from Last 3 Encounters:  08/04/20 165  lb 9.6 oz (75.1 kg)  07/28/20 167 lb (75.8 kg)  05/26/20 173 lb 15.1 oz (78.9 kg)     Glycemic control:    Lab Results  Component Value Date   HGBA1C 5.3 03/31/2020   HGBA1C 5.4 12/15/2019   HGBA1C 5.7 09/15/2019   Lab Results  Component Value Date   MICROALBUR 37.2 09/03/2016   LDLCALC 22 03/29/2020   CREATININE 6.28 (H) 07/28/2020    Lab Results  Component Value Date   FRUCTOSAMINE 295 (H) 08/02/2020   FRUCTOSAMINE 322 (H) 03/29/2020   FRUCTOSAMINE 293 (H) 12/15/2019    PROBLEM 2 Osteoporosis: See review of systems     Allergies as of 08/04/2020      Reactions   Ambien [zolpidem Tartrate] Other (See Comments)   hallucinations and "felt crazy"    Penicillins Rash   Has patient had a PCN reaction causing immediate rash, facial/tongue/throat swelling, SOB or lightheadedness with hypotension: Yes Has patient had a  PCN reaction causing severe rash involving mucus membranes or skin necrosis: Yes Has patient had a PCN reaction that required hospitalization: No Has patient had a PCN reaction occurring within the last 10 years: No If all of the above answers are "NO", then may proceed with Cephalosporin use.      Medication List       Accurate as of August 04, 2020  4:57 PM. If you have any questions, ask your nurse or doctor.        acetaminophen 325 MG tablet Commonly known as: TYLENOL Take 1-2 tablets (325-650 mg total) by mouth every 4 (four) hours as needed for mild pain. What changed:   how much to take  when to take this   albuterol 108 (90 Base) MCG/ACT inhaler Commonly known as: VENTOLIN HFA Inhale 1 puff into the lungs daily as needed for wheezing or shortness of breath.   allopurinol 100 MG tablet Commonly known as: ZYLOPRIM Take 100 mg by mouth daily.   camphor-menthol lotion Commonly known as: SARNA Apply 1 application topically 4 (four) times daily as needed for itching.   cetirizine 10 MG chewable tablet Commonly known as: ZYRTEC Chew 10 mg by mouth daily as needed (allergies.).   clobetasol 0.05 % external solution Commonly known as: TEMOVATE Apply 1 application topically 2 (two) times daily as needed (scalp irritation.).   denosumab 60 MG/ML Sosy injection Commonly known as: PROLIA Inject 60 mg into the skin every 6 (six) months.   donepezil 10 MG tablet Commonly known as: ARICEPT Take 1 tablet (10 mg total) by mouth at bedtime.   feeding supplement (NEPRO CARB STEADY) Liqd Take 237 mLs by mouth in the morning and at bedtime.   Fluocinolone Acetonide Scalp 0.01 % Oil Apply 1 application topically daily as needed (scalp irritation.).   fluticasone 50 MCG/ACT nasal spray Commonly known as: FLONASE Place 1 spray into both nostrils daily. What changed:   when to take this  reasons to take this   FreeStyle Freedom Lite w/Device Kit Use to check blood  sugar 2 times per day dx code E11.65   freestyle lancets Use as instructed to check blood sugar 2 times per day dx code E11.65   FREESTYLE LITE test strip Generic drug: glucose blood USE THREE TIMES DAILY   hydrocortisone 2.5 % cream Apply 1 application topically 3 (three) times daily as needed (skin irritation (legs & arms)).   insulin aspart 100 UNIT/ML FlexPen Commonly known as: NovoLOG FlexPen INJECT 6-8 UNITS UNDER THE SKIN THREE  TIMES DAILY BEFORE MEALS. DX:E11.65 What changed:   how much to take  how to take this  when to take this  additional instructions   ketoconazole 2 % shampoo Commonly known as: Nizoral Apply 1 application topically 2 (two) times a week. What changed:   when to take this  reasons to take this   levothyroxine 50 MCG tablet Commonly known as: SYNTHROID Take 1 tablet (50 mcg total) by mouth daily. What changed: when to take this   lidocaine 5 % Commonly known as: LIDODERM Place 1 patch onto the skin daily as needed (back pain).   memantine 10 MG tablet Commonly known as: NAMENDA Take 1 tablet (10 mg total) by mouth 2 (two) times daily.   midodrine 10 MG tablet Commonly known as: PROAMATINE Take one pill prior to hemodialysis on MWF What changed:   how much to take  how to take this  when to take this  additional instructions   omeprazole 40 MG capsule Commonly known as: PRILOSEC Take 1 capsule (40 mg total) by mouth daily. What changed: when to take this   Pen Needles 32G X 4 MM Misc 1 each by Does not apply route 3 (three) times daily. USE PEN NEEDLES TO INJECT INSULIN THREE TIMES DAILY.   sevelamer carbonate 800 MG tablet Commonly known as: RENVELA Take 1,200-2,400 mg by mouth See admin instructions. Take 3 tablets (2400 mg) by mouth twice daily with a meal & take 2 tablet (1600 mg) by mouth with a snack   simvastatin 20 MG tablet Commonly known as: ZOCOR Take 20 mg by mouth at bedtime.   Theratears 0.25 %  Soln Generic drug: Carboxymethylcellulose Sodium Place 1 drop into both eyes daily as needed (dry eyes).   Tradjenta 5 MG Tabs tablet Generic drug: linagliptin TAKE 1 TABLET DAILY What changed: how much to take   Tums Ultra 1000 400 MG chewable tablet Generic drug: calcium elemental as carbonate Chew 1,000 mg by mouth 2 (two) times daily.       Allergies:  Allergies  Allergen Reactions   Ambien [Zolpidem Tartrate] Other (See Comments)    hallucinations and "felt crazy"    Penicillins Rash    Has patient had a PCN reaction causing immediate rash, facial/tongue/throat swelling, SOB or lightheadedness with hypotension: Yes Has patient had a PCN reaction causing severe rash involving mucus membranes or skin necrosis: Yes Has patient had a PCN reaction that required hospitalization: No Has patient had a PCN reaction occurring within the last 10 years: No If all of the above answers are "NO", then may proceed with Cephalosporin use.     Past Medical History:  Diagnosis Date   Allergy    Anemia    Arthritis    Cataract    bil cateracts removed   Coronary artery disease    Dementia arising in the senium and presenium ([place])    Diabetes mellitus    Type 2   Diverticulitis    ED (erectile dysfunction)    Elevated homocysteine    ESRD (end stage renal disease) on dialysis ([place]) 03/2015   GERD (gastroesophageal reflux disease)    pepto    Gout    Hiatal hernia    Hyperlipidemia    Hypertension    Hypothyroidism    Pneumonia    PVD (peripheral vascular disease) ([place])    has plastic aorta   Renal insufficiency    Seasonal allergies    Sleep apnea    does not wear  c-pap    Past Surgical History:  Procedure Laterality Date   aortobifemoral bypass     AV FISTULA PLACEMENT Left 12/01/2013   Procedure: ARTERIOVENOUS (AV) FISTULA CREATION- LEFT BRACHIOCEPHALIC;  Surgeon: Angelia Mould, MD;  Location: [facility];  Service: Vascular;  Laterality:  Left;   [place] Right 07/27/2014   Procedure: [place];  Surgeon: Angelia Mould, MD;  Location: [facility];  Service: Vascular;  Laterality: Right;   BRAIN SURGERY  07/22/2018   BREAST SURGERY     left - granulomatous mastitis   BURR HOLE Bilateral 07/22/2018   Procedure: BILATERAL BURR HOLES;  Surgeon: Kristeen Miss, MD;  Location: [facility];  Service: Neurosurgery;  Laterality: Bilateral;   COLONOSCOPY     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 ([place] [facility])  1992   ENTEROSCOPY N/A 06/03/2018   Procedure: ENTEROSCOPY;  Surgeon: Lavena Bullion, DO;  Location: [facility];  Service: Gastroenterology;  Laterality: N/A;   ENTEROSCOPY N/A 11/20/2018   Procedure: ENTEROSCOPY;  Surgeon: Rush Landmark Telford Nab., MD;  Location: [facility];  Service: Gastroenterology;  Laterality: N/A;   ENTEROSCOPY N/A 12/03/2019   Procedure: ENTEROSCOPY;  Surgeon: Lavena Bullion, DO;  Location: [facility];  Service: Gastroenterology;  Laterality: N/A;  push enteroscopy   EYE SURGERY Bilateral    cataracts   HEMODIALYSIS INPATIENT  01/17/2018       HOT HEMOSTASIS N/A 06/03/2018   Procedure: HOT HEMOSTASIS (ARGON PLASMA COAGULATION/BICAP);  Surgeon: Lavena Bullion, DO;  Location: [facility] ENDOSCOPY;  Service: Gastroenterology;  Laterality: N/A;   HOT HEMOSTASIS N/A 11/20/2018   Procedure: HOT HEMOSTASIS (ARGON PLASMA COAGULATION/BICAP);  Surgeon: Irving Copas., MD;  Location: [facility];  Service: Gastroenterology;  Laterality: N/A;   HOT HEMOSTASIS N/A 12/03/2019   Procedure: HOT HEMOSTASIS (ARGON PLASMA COAGULATION/BICAP);  Surgeon: Lavena Bullion, DO;  Location: [facility];  Service: Gastroenterology;  Laterality: N/A;   REVISION OF ARTERIOVENOUS GORETEX GRAFT Right 09/01/2019   Procedure: REVISION OF ARTERIOVENOUS FISTULA RIGHT ARM;  Surgeon: Serafina Mitchell, MD;  Location: [facility];  Service: Vascular;  Laterality: Right;   REVISON OF  ARTERIOVENOUS FISTULA Left 02/09/2014   Procedure: REVISON OF LEFT ARTERIOVENOUS FISTULA - RESECTION OF RENDUNDANT VEIN;  Surgeon: Angelia Mould, MD;  Location: [facility];  Service: Vascular;  Laterality: Left;   SBO with lysis adhesions     SHUNTOGRAM Left 04/19/2014   Procedure: FISTULOGRAM;  Surgeon: Angelia Mould, MD;  [facility] C[facility] LAB;  Service: Cardiovascular;  Laterality: Left;   UNILATERAL UPPER EXTREMEITY ANGIOGRAM N/A 07/12/2014   Procedure: UNILATERAL UPPER Anselmo Rod;  Surgeon: Angelia Mould, MD;  Location: [facility] C[facility] LAB;  Service: Cardiovascular;  Laterality: N/A;    Family History  Problem Relation Age of Onset   Aneurysm Mother        brain   Heart disease Father    Stroke Father    Hypertension Father    Diabetes Father    Dementia Neg Hx    Colon cancer Neg Hx    Esophageal cancer Neg Hx    Pancreatic cancer Neg Hx    Prostate cancer Neg Hx    Rectal cancer Neg Hx    Stomach cancer Neg Hx     Social History:  reports that he quit smoking about 25 years ago. He has never used smokeless tobacco. He reports that he does not drink alcohol and does not use drugs.    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 he was not given Forteo  He has been on Prolia since 5/19 and last injection was in 05/19/20  Calcium has been variable, also followed by nephrologist Also previously calcium was high since his last Prolia injection it has been normal to low  Currently taking Tums After correction for albumin of 3.0 his calcium now is 8.7  PTH values not available   Lab Results  Component Value Date   PTH 154 (H) 10/06/2014   CALCIUM 7.9 (L) 08/02/2020   CAION 1.17 12/03/2019   PHOS  4.0 06/16/2019     Lipid history: He has been treated with Simvastatin 20 mg  by PCP, results as follows    Lab Results  Component Value Date   CHOL 81 03/29/2020   HDL 39.50 03/29/2020   [place] 22 03/29/2020   TRIG 101.0 03/29/2020   CHOLHDL 2 03/29/2020          He has ESRD on dialysis    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  Is on 50 mcg levothyroxine with normal TSH levels, this has been prescribed by PCP  Lab Results  Component Value Date   TSH 1.90 03/29/2020      Physical Examination:  BP 136/64    Pulse 63    Ht _0  (1.803 m)    Wt 165 lb 9.6 oz (75.1 kg)    SpO2 99%    BMI 23.10 kg/m      ASSESSMENT:  Diabetes type 2  See history of present illness for discussion of his current management, blood sugar patterns and problems identified  He is being treated with mealtime NovoLog and Tradjenta  His fructosamine is 295, A1c usually falsely low  He again appears to be losing weight recently Currently no hypoglycemia documented with 6 units of NovoLog at breakfast and usually the same at dinnertime Likely is having decreased intake and overall blood sugar may be lower than before  OSTEOPOROSIS with history of fracture: Likely cannot continue Prolia now because of tendency to hypercalcemia Manual to discuss use of Forteo with nephrologist but not clear what his PTH level has been  HYPERCALCEMIA: Resolved with corrected calcium 8.7, he can continue Tums for this   PLAN:   Reduce NovoLog to 5 units at breakfast time More readings to be done after meals Consider reducing suppertime dose also if postprandial readings are lower Discussed blood sugar targets Also needs to check some readings after lunch on his nondialysis days  Osteoporosis and calcium issues: See above    Patient Instructions  Take 5 Units in am  Bedtime snack      Elayne Snare 08/04/2020, 4:57 PM   Note:  This office note was prepared with Dragon voice recognition system technology. Any transcriptional errors that result from this process are unintentional.

## 2020-08-05 DIAGNOSIS — N2581 Secondary hyperparathyroidism of renal origin: Secondary | ICD-10-CM | POA: Diagnosis not present

## 2020-08-05 DIAGNOSIS — N186 End stage renal disease: Secondary | ICD-10-CM | POA: Diagnosis not present

## 2020-08-05 DIAGNOSIS — E876 Hypokalemia: Secondary | ICD-10-CM | POA: Diagnosis not present

## 2020-08-05 DIAGNOSIS — Z992 Dependence on renal dialysis: Secondary | ICD-10-CM | POA: Diagnosis not present

## 2020-08-05 DIAGNOSIS — E119 Type 2 diabetes mellitus without complications: Secondary | ICD-10-CM | POA: Diagnosis not present

## 2020-08-06 ENCOUNTER — Other Ambulatory Visit: Payer: Self-pay | Admitting: Neurology

## 2020-08-06 ENCOUNTER — Ambulatory Visit
Admission: RE | Admit: 2020-08-06 | Discharge: 2020-08-06 | Disposition: A | Discharge status: Home / Self Care | Payer: Medicare Other | Source: Ambulatory Visit | Attending: Neurology | Admitting: Neurology

## 2020-08-06 ENCOUNTER — Other Ambulatory Visit: Payer: Self-pay

## 2020-08-06 DIAGNOSIS — G309 Alzheimer's disease, unspecified: Secondary | ICD-10-CM

## 2020-08-06 DIAGNOSIS — F0390 Unspecified dementia without behavioral disturbance: Secondary | ICD-10-CM | POA: Diagnosis not present

## 2020-08-06 DIAGNOSIS — I679 Cerebrovascular disease, unspecified: Secondary | ICD-10-CM

## 2020-08-08 ENCOUNTER — Telehealth: Payer: Self-pay | Admitting: *Deleted

## 2020-08-08 DIAGNOSIS — E876 Hypokalemia: Secondary | ICD-10-CM | POA: Diagnosis not present

## 2020-08-08 DIAGNOSIS — N186 End stage renal disease: Secondary | ICD-10-CM | POA: Diagnosis not present

## 2020-08-08 DIAGNOSIS — E119 Type 2 diabetes mellitus without complications: Secondary | ICD-10-CM | POA: Diagnosis not present

## 2020-08-08 DIAGNOSIS — Z992 Dependence on renal dialysis: Secondary | ICD-10-CM | POA: Diagnosis not present

## 2020-08-08 DIAGNOSIS — N2581 Secondary hyperparathyroidism of renal origin: Secondary | ICD-10-CM | POA: Diagnosis not present

## 2020-08-08 NOTE — Telephone Encounter (Signed)
I called the pt/wife on their home # and LVM (ok per DPR) advising of pt's MRI brain results in detail as noted below by Dr Jaynee Eagles. I also advised them to proceed with the formal memory testing with Dr Sima Matas which I noted they have an appt scheduled already in February. Advised that the results are available for review on mychart. I left the office number for a call back if they have any questions.

## 2020-08-08 NOTE — Telephone Encounter (Signed)
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 Sometimes they do not go away but become chronic. As long as they are stable it is fine. He also has brain volume loss which can happen with aging and some mild vascular changes also can be seen in normal aging. Please proceed to the formal memory testing. Thank you.

## 2020-08-10 DIAGNOSIS — E876 Hypokalemia: Secondary | ICD-10-CM | POA: Diagnosis not present

## 2020-08-10 DIAGNOSIS — E119 Type 2 diabetes mellitus without complications: Secondary | ICD-10-CM | POA: Diagnosis not present

## 2020-08-10 DIAGNOSIS — N2581 Secondary hyperparathyroidism of renal origin: Secondary | ICD-10-CM | POA: Diagnosis not present

## 2020-08-10 DIAGNOSIS — N186 End stage renal disease: Secondary | ICD-10-CM | POA: Diagnosis not present

## 2020-08-10 DIAGNOSIS — Z992 Dependence on renal dialysis: Secondary | ICD-10-CM | POA: Diagnosis not present

## 2020-08-11 ENCOUNTER — Other Ambulatory Visit (INDEPENDENT_AMBULATORY_CARE_PROVIDER_SITE_OTHER): Payer: Medicare Other

## 2020-08-11 ENCOUNTER — Ambulatory Visit (INDEPENDENT_AMBULATORY_CARE_PROVIDER_SITE_OTHER): Payer: Medicare Other | Admitting: Gastroenterology

## 2020-08-11 ENCOUNTER — Encounter: Payer: Self-pay | Admitting: Gastroenterology

## 2020-08-11 VITALS — BP 122/60 | HR 74 | Ht 71.0 in | Wt 164.2 lb

## 2020-08-11 DIAGNOSIS — R1032 Left lower quadrant pain: Secondary | ICD-10-CM

## 2020-08-11 DIAGNOSIS — K5520 Angiodysplasia of colon without hemorrhage: Secondary | ICD-10-CM

## 2020-08-11 DIAGNOSIS — K921 Melena: Secondary | ICD-10-CM

## 2020-08-11 LAB — CBC
HCT: 39 % (ref 39.0–52.0)
Hemoglobin: 12.7 g/dL — ABNORMAL LOW (ref 13.0–17.0)
MCHC: 32.7 g/dL (ref 30.0–36.0)
MCV: 104.1 fl — ABNORMAL HIGH (ref 78.0–100.0)
Platelets: 132 10*3/uL — ABNORMAL LOW (ref 150.0–400.0)
RBC: 3.74 Mil/uL — ABNORMAL LOW (ref 4.22–5.81)
RDW: 16.3 % — ABNORMAL HIGH (ref 11.5–15.5)
WBC: 7.5 10*3/uL (ref 4.0–10.5)

## 2020-08-11 MED ORDER — DICYCLOMINE HCL 20 MG PO TABS: 20.0000 mg | ORAL_TABLET | Freq: Three times a day (TID) | ORAL | 3 refills | Status: DC

## 2020-08-11 NOTE — Progress Notes (Signed)
P  Chief Complaint:    Melena  GI History: 79 y.o.malewith a history ofESRDon hemodialysis, CAD, HTN,AAA repair/aortobifemoral bypass 1992,DM II, Hypothyroidism, GERD, subdural hematomas/pfall requiring subdural evacuation in 06/2017, diverticulitis, HX of UGI bleed9/2019(gastric AVMs)and readmitted in 10/2018 with melena, requiring 1 unit PRBCs (Hgbnadir 7.6). CT unrevealing. Small bowel enteroscopy completed 11/20/2018 and n/fbleeding in gastric cardia (APC), AVM in gastric fundus (APC), gastritis, oozing x2 in the duodenal bulb (APC), 3 AVMs in proximal jejunum (APC). Discharge hemoglobin 8.6and repeat in 11/2018 was 11.1 and 10.6 in late March. IV iron infusion with HD in 12/2018.  Developed melena in 10/2019 with H/H 10.6/33 (stable).  CT abdomen/pelvis unremarkable for acute issues.  Endoscopic history: Small bowel enteroscopy 12/03/2019 by Dr. Bryan Lemma: -2 cm HH -Nonobstructing Schatzki's ring -Normal stomach -2 AVMs in D3/D4, 3 AVMs and proximal jejunum, all treated with APC -Tattoo seen jejunum from prior enteroscopy  Small bowel capsule endoscopy 03/17/2019: 1. Complete study, adequate prep. 2. No active bleeding/oozing. 3. Few tiny red spots/possible tiny AVMs in the mid small bowel around 2 hours 30 minutes. 4. Distal small bowel AVMs at 7 hours 13 minutes range.  Smallbowel enteroscopy 2/27/2020by Dr. Rush Landmark: - No gross lesions in esophagus. - A single spot with bleeding in the stomach. Treated with argon plasma coagulation (APC). - A single non-bleeding angioectasia in the stomach. Treated with argon plasma coagulation (APC). - Erythematous mucosa in the greater curvature. - No other gross lesions in the stomach. - Two spots with oozing in the duodenum. Treated with argon plasma coagulation (APC). - Normal mucosa was found in the entire examined duodenum otherwise. - Three non-bleeding angioectasias in the jejunum. Treated with argon  plasma coagulation (APC). - Normal mucosa was found in the jejunum otherwise. Tattooed.  Small bowel enteroscopy09/06/2018 done due to UGI bleed/melena/anemiaby Dr. Bryan Lemma: - The examined portion of the jejunum was normal. - Normal duodenal bulb, first portion of the duodenum, second portion of the duodenum, third portion of the duodenum and fourth portion of the duodenum. - Normal esophagus. - A single recently bleeding angioectasia in the stomach. Treated with argon plasma coagulation (APC). - Normal gastric body, antrum and pylorus. - No specimens collected.  EGD 11/12/2017 done due to [facility] Dr. Silverio Decamp. - Benign-appearing esophageal stenosis. - Erythematous mucosa in the stomach.No H. Pylori. - Normal examined duodenum.  Colonoscopy 11/12/2017 done due to [facility] Dr. Silverio Decamp: - The perianal and digital rectal examinations were normal. - A 9 mmtubulovillouspolyp was found in the transverse colon. The polyp was sessile. The polyp was removed with a cold snare. Resection and retrieval were complete. - A 1 mmhyperplasticpolyp was found in the recto-sigmoid colon. The polyp was sessile. The polyp was removed with a cold biopsy forceps. Resection and retrieval were complete. - A single localized non-bleeding erosion was found in the transverse colon. No stigmata of recent bleeding were seen. - A few small and large-mouthed diverticula were found in the sigmoid colon, descending colon, transverse colon, hepatic flexure and ascending colon. - Non-bleeding internal hemorrhoids were found during retroflexion. The hemorrhoids were small.  HPI:     Patient is a 79 y.o. male presenting to the Gastroenterology Clinic for follow-up.  Last seen in the GI clinic by Alonza Bogus on 04/14/2020 for evaluation melenic stool.  Hemoglobin was at baseline at 10.7.  Stools returned to normal without intervention and proceeded with conservative management.  Did give Rx for hydrocortisone  suppositories prn if hemorrhoidal symptoms.  Today, he reports that  he has been having melena for the last 3+ weeks.  Return to normal stools yesterday and this morning.  No increase in stool frequency. No diarrhea. No UGI sxs.  Occasional LLQ discomfort described as "nagging pain" throughout the day. No radiation. Not a/w above melenic stool.  Taking Benefier daily with soft stools.  Pain improves with BM.  Was seen in the ER for this issue on 07/17/2020.  Largely unremarkable work-up as below: -CT abdomen/pelvis: Stable hepatic cysts, otherwise liver, GI tract -H/H 10.8/33.5, MCV/RDW 110.9/19 (all essentially stable from baseline).  PLT 155 (stable).  FOBT negative  -07/28/2020: Normal thiamine, B12, folate  Has iron checked routinely at HD and has been normal per wife.   Has been having issues with his memory; wife asissts in history. Follows in the Neurology clinic.  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  Has NM PET metabolic brain later this month.    Review of systems:     No chest pain, no SOB, no fevers, no urinary sx   Past Medical History:  Diagnosis Date  . Allergy   . Anemia   . Arthritis   . Cataract    bil cateracts removed  . Coronary artery disease   . Dementia arising in the senium and presenium ([place])   . Diabetes mellitus    Type 2  . Diverticulitis   . ED (erectile dysfunction)   . Elevated homocysteine   . ESRD (end stage renal disease) on dialysis ([place]) 03/2015  . GERD (gastroesophageal reflux disease)    pepto   . Gout   . Hiatal hernia   . Hyperlipidemia   . Hypertension   . Hypothyroidism   . Pneumonia   . PVD (peripheral vascular disease) ([place])    has plastic aorta  . Renal insufficiency   . Seasonal allergies   . Sleep apnea    does not wear c-pap    Patient's surgical history, family medical history, social history, medications and allergies were all reviewed in Epic    Current Outpatient Medications   Medication Sig Dispense Refill  . acetaminophen (TYLENOL) 325 MG tablet Take 1-2 tablets (325-650 mg total) by mouth every 4 (four) hours as needed for mild pain. (Patient taking differently: Take 650 mg by mouth in the morning and at bedtime. )    . albuterol (VENTOLIN HFA) 108 (90 Base) MCG/ACT inhaler Inhale 1 puff into the lungs daily as needed for wheezing or shortness of breath.    . allopurinol (ZYLOPRIM) 100 MG tablet Take 100 mg by mouth daily.     . Blood Glucose Monitoring Suppl (FREESTYLE FREEDOM LITE) w/Device KIT Use to check blood sugar 2 times per day dx code E11.65 1 each 0  . calcium elemental as carbonate (TUMS ULTRA 1000) 400 MG chewable tablet Chew 1,000 mg by mouth 2 (two) times daily.    . camphor-menthol (SARNA) lotion Apply 1 application topically 4 (four) times daily as needed for itching.     . Carboxymethylcellulose Sodium (THERATEARS) 0.25 % SOLN Place 1 drop into both eyes daily as needed (dry eyes).    . cetirizine (ZYRTEC) 10 MG chewable tablet Chew 10 mg by mouth daily as needed (allergies.).     Marland Kitchen clobetasol (TEMOVATE) 0.05 % external solution Apply 1 application topically 2 (two) times daily as needed (scalp irritation.).   0  . denosumab (PROLIA) 60 MG/ML SOSY injection Inject 60 mg into the skin every 6 (six) months.    Marland Kitchen  donepezil (ARICEPT) 10 MG tablet Take 1 tablet (10 mg total) by mouth at bedtime. 90 tablet 3  . FLUOCINOLONE ACETONIDE SCALP 0.01 % OIL Apply 1 application topically daily as needed (scalp irritation.).   0  . fluticasone (FLONASE) 50 MCG/ACT nasal spray Place 1 spray into both nostrils daily. (Patient taking differently: Place 1 spray into both nostrils daily as needed for allergies. ) 16 g 2  . glucose blood (FREESTYLE LITE) test strip USE THREE TIMES DAILY 300 strip 2  . hydrocortisone 2.5 % cream Apply 1 application topically 3 (three) times daily as needed (skin irritation (legs & arms)).     Marland Kitchen insulin aspart (NOVOLOG FLEXPEN) 100 UNIT/ML  FlexPen INJECT 6-8 UNITS UNDER THE SKIN THREE TIMES DAILY BEFORE MEALS. DX:E11.65 (Patient taking differently: Inject 6-8 Units into the skin 3 (three) times daily with meals. Sliding scale insulin) 15 mL 12  . Insulin Pen Needle (PEN NEEDLES) 32G X 4 MM MISC 1 each by Does not apply route 3 (three) times daily. USE PEN NEEDLES TO INJECT INSULIN THREE TIMES DAILY. 100 each 12  . ketoconazole (NIZORAL) 2 % shampoo Apply 1 application topically 2 (two) times a week. (Patient taking differently: Apply 1 application topically daily as needed for irritation. ) 120 mL 0  . Lancets (FREESTYLE) lancets Use as instructed to check blood sugar 2 times per day dx code E11.65 100 each 3  . levothyroxine (SYNTHROID, LEVOTHROID) 50 MCG tablet Take 1 tablet (50 mcg total) by mouth daily. (Patient taking differently: Take 50 mcg by mouth daily before breakfast. ) 90 tablet 1  . lidocaine (LIDODERM) 5 % Place 1 patch onto the skin daily as needed (back pain).     . memantine (NAMENDA) 10 MG tablet Take 1 tablet (10 mg total) by mouth 2 (two) times daily. 180 tablet 3  . midodrine (PROAMATINE) 10 MG tablet Take one pill prior to hemodialysis on MWF (Patient taking differently: Take 10 mg by mouth See admin instructions. Take one tablet (10 mg) by mouth prior to dialysis - Monday, Wednesday, Friday)    . Nutritional Supplements (FEEDING SUPPLEMENT, NEPRO CARB STEADY,) LIQD Take 237 mLs by mouth in the morning and at bedtime.     Marland Kitchen omeprazole (PRILOSEC) 40 MG capsule Take 1 capsule (40 mg total) by mouth daily. (Patient taking differently: Take 40 mg by mouth daily before breakfast. ) 30 capsule 0  . sevelamer carbonate (RENVELA) 800 MG tablet Take 1,200-2,400 mg by mouth See admin instructions. Take 3 tablets (2400 mg) by mouth twice daily with a meal & take 2 tablet (1600 mg) by mouth with a snack    . simvastatin (ZOCOR) 20 MG tablet Take 20 mg by mouth at bedtime.     . TRADJENTA 5 MG TABS tablet TAKE 1 TABLET DAILY  (Patient taking differently: Take 5 mg by mouth daily. ) 90 tablet 3   No current facility-administered medications for this visit.    Physical Exam:     BP 122/60   Pulse 74   Ht 5' 11"  (1.803 m)   Wt 164 lb 4 oz (74.5 kg)   BMI 22.91 kg/m   GENERAL:  Pleasant male in NAD PSYCH: : Cooperative, normal affect EENT:  conjunctiva pink, mucous membranes moist, neck supple without masses ABDOMEN: Minimal TTP without rebound or guarding in left hemiabdomen.  No peritoneal signs.  Nondistended, soft. No obvious masses, no hepatomegaly,  normal bowel sounds SKIN:  turgor, no lesions seen Musculoskeletal:  Normal  muscle tone, normal strength NEURO: Alert and oriented x 3, no focal neurologic deficits   IMPRESSION and PLAN:    1) History of gastric and small bowel AVMs 2) Melenic stool No history of gastric and small bowel AVMs with previous APC.  Also with mid and distal small bowel AVMs noted on previous VCE, which were likely outside of reach of balloon enteroscopy.  Suspect recurrent AVM bleed, but has otherwise remained hemodynamically stable.  Discussed management options to include conservative management, repeat enteroscopy, medical management, and he and his wife elected for conservative management.  Given comorbidities, stability, agree with this reasonable decision. -Check CBC today.  If decreased from recent ER evaluation, could modify plan -Has iron routinely checked at HD per wife  3) Left-sided abdominal pain -Trial course of Bentyl on demand -To contact me if pain changes in location, intensity, quality, etc.  I spent 35 minutes of time, including in depth chart review, independent review of results as outlined above, communicating results with the patient directly, face-to-face time with the patient, coordinating care, and ordering studies and medications as appropriate, and documentation.          Lavena Bullion ,DO, FACG 08/11/2020, 11:33 AM

## 2020-08-12 DIAGNOSIS — E876 Hypokalemia: Secondary | ICD-10-CM | POA: Diagnosis not present

## 2020-08-12 DIAGNOSIS — Z992 Dependence on renal dialysis: Secondary | ICD-10-CM | POA: Diagnosis not present

## 2020-08-12 DIAGNOSIS — N2581 Secondary hyperparathyroidism of renal origin: Secondary | ICD-10-CM | POA: Diagnosis not present

## 2020-08-12 DIAGNOSIS — E119 Type 2 diabetes mellitus without complications: Secondary | ICD-10-CM | POA: Diagnosis not present

## 2020-08-12 DIAGNOSIS — N186 End stage renal disease: Secondary | ICD-10-CM | POA: Diagnosis not present

## 2020-08-13 ENCOUNTER — Other Ambulatory Visit: Payer: Medicare Other

## 2020-08-14 DIAGNOSIS — E876 Hypokalemia: Secondary | ICD-10-CM | POA: Diagnosis not present

## 2020-08-14 DIAGNOSIS — N186 End stage renal disease: Secondary | ICD-10-CM | POA: Diagnosis not present

## 2020-08-14 DIAGNOSIS — Z992 Dependence on renal dialysis: Secondary | ICD-10-CM | POA: Diagnosis not present

## 2020-08-14 DIAGNOSIS — N2581 Secondary hyperparathyroidism of renal origin: Secondary | ICD-10-CM | POA: Diagnosis not present

## 2020-08-14 DIAGNOSIS — E119 Type 2 diabetes mellitus without complications: Secondary | ICD-10-CM | POA: Diagnosis not present

## 2020-08-15 DIAGNOSIS — Z23 Encounter for immunization: Secondary | ICD-10-CM | POA: Diagnosis not present

## 2020-08-16 DIAGNOSIS — E119 Type 2 diabetes mellitus without complications: Secondary | ICD-10-CM | POA: Diagnosis not present

## 2020-08-16 DIAGNOSIS — N186 End stage renal disease: Secondary | ICD-10-CM | POA: Diagnosis not present

## 2020-08-16 DIAGNOSIS — Z992 Dependence on renal dialysis: Secondary | ICD-10-CM | POA: Diagnosis not present

## 2020-08-16 DIAGNOSIS — E876 Hypokalemia: Secondary | ICD-10-CM | POA: Diagnosis not present

## 2020-08-16 DIAGNOSIS — N2581 Secondary hyperparathyroidism of renal origin: Secondary | ICD-10-CM | POA: Diagnosis not present

## 2020-08-19 DIAGNOSIS — E119 Type 2 diabetes mellitus without complications: Secondary | ICD-10-CM | POA: Diagnosis not present

## 2020-08-19 DIAGNOSIS — N2581 Secondary hyperparathyroidism of renal origin: Secondary | ICD-10-CM | POA: Diagnosis not present

## 2020-08-19 DIAGNOSIS — Z992 Dependence on renal dialysis: Secondary | ICD-10-CM | POA: Diagnosis not present

## 2020-08-19 DIAGNOSIS — N186 End stage renal disease: Secondary | ICD-10-CM | POA: Diagnosis not present

## 2020-08-19 DIAGNOSIS — E876 Hypokalemia: Secondary | ICD-10-CM | POA: Diagnosis not present

## 2020-08-22 DIAGNOSIS — Z992 Dependence on renal dialysis: Secondary | ICD-10-CM | POA: Diagnosis not present

## 2020-08-22 DIAGNOSIS — N2581 Secondary hyperparathyroidism of renal origin: Secondary | ICD-10-CM | POA: Diagnosis not present

## 2020-08-22 DIAGNOSIS — E119 Type 2 diabetes mellitus without complications: Secondary | ICD-10-CM | POA: Diagnosis not present

## 2020-08-22 DIAGNOSIS — N186 End stage renal disease: Secondary | ICD-10-CM | POA: Diagnosis not present

## 2020-08-22 DIAGNOSIS — E876 Hypokalemia: Secondary | ICD-10-CM | POA: Diagnosis not present

## 2020-08-23 ENCOUNTER — Ambulatory Visit (HOSPITAL_COMMUNITY)
Admission: RE | Admit: 2020-08-23 | Discharge: 2020-08-23 | Disposition: A | Discharge status: Home / Self Care | Payer: Medicare Other | Source: Ambulatory Visit | Attending: Neurology | Admitting: Neurology

## 2020-08-23 ENCOUNTER — Other Ambulatory Visit: Payer: Self-pay

## 2020-08-23 DIAGNOSIS — G309 Alzheimer's disease, unspecified: Secondary | ICD-10-CM | POA: Diagnosis not present

## 2020-08-23 DIAGNOSIS — F0390 Unspecified dementia without behavioral disturbance: Secondary | ICD-10-CM | POA: Diagnosis not present

## 2020-08-23 LAB — GLUCOSE, CAPILLARY: Glucose-Capillary: 109 mg/dL — ABNORMAL HIGH (ref 70–99)

## 2020-08-23 MED ORDER — FLUDEOXYGLUCOSE F - 18 (FDG) INJECTION
9.9200 | Freq: Once | INTRAVENOUS | Status: AC
  Administered 2020-08-23: 9.92 via INTRAVENOUS

## 2020-08-24 ENCOUNTER — Telehealth: Payer: Self-pay | Admitting: Neurology

## 2020-08-24 DIAGNOSIS — E876 Hypokalemia: Secondary | ICD-10-CM | POA: Diagnosis not present

## 2020-08-24 DIAGNOSIS — N186 End stage renal disease: Secondary | ICD-10-CM | POA: Diagnosis not present

## 2020-08-24 DIAGNOSIS — N2581 Secondary hyperparathyroidism of renal origin: Secondary | ICD-10-CM | POA: Diagnosis not present

## 2020-08-24 DIAGNOSIS — E119 Type 2 diabetes mellitus without complications: Secondary | ICD-10-CM | POA: Diagnosis not present

## 2020-08-24 DIAGNOSIS — E1122 Type 2 diabetes mellitus with diabetic chronic kidney disease: Secondary | ICD-10-CM | POA: Diagnosis not present

## 2020-08-24 DIAGNOSIS — Z992 Dependence on renal dialysis: Secondary | ICD-10-CM | POA: Diagnosis not present

## 2020-08-24 DIAGNOSIS — D631 Anemia in chronic kidney disease: Secondary | ICD-10-CM | POA: Diagnosis not present

## 2020-08-24 NOTE — Telephone Encounter (Signed)
Called and spoke to the patient's wife. Advised the pt's wife that the study was suggestive of alzheimer's dx. Advised the best way to follow that is by the testing and advised the patient to continue forward with neuro-cognitive testing that he is scheduled for in feb. I have also scheduled a follow up here for the patient in march after cognitive testing completed. [name] verbalized understanding and she had no further questions.

## 2020-08-24 NOTE — Telephone Encounter (Signed)
-----   Message from Melvenia Beam, MD sent at 08/24/2020  9:05 AM EST ----- PET Scan is suggestive of Alzheimer's disease. But I would still proceed to formal memory testing thank you.(Dr. Madaline Brilliant)

## 2020-08-25 DIAGNOSIS — R3915 Urgency of urination: Secondary | ICD-10-CM | POA: Diagnosis not present

## 2020-08-26 DIAGNOSIS — E876 Hypokalemia: Secondary | ICD-10-CM | POA: Diagnosis not present

## 2020-08-26 DIAGNOSIS — N2581 Secondary hyperparathyroidism of renal origin: Secondary | ICD-10-CM | POA: Diagnosis not present

## 2020-08-26 DIAGNOSIS — Z992 Dependence on renal dialysis: Secondary | ICD-10-CM | POA: Diagnosis not present

## 2020-08-26 DIAGNOSIS — N186 End stage renal disease: Secondary | ICD-10-CM | POA: Diagnosis not present

## 2020-08-26 DIAGNOSIS — E119 Type 2 diabetes mellitus without complications: Secondary | ICD-10-CM | POA: Diagnosis not present

## 2020-08-29 DIAGNOSIS — E876 Hypokalemia: Secondary | ICD-10-CM | POA: Diagnosis not present

## 2020-08-29 DIAGNOSIS — N2581 Secondary hyperparathyroidism of renal origin: Secondary | ICD-10-CM | POA: Diagnosis not present

## 2020-08-29 DIAGNOSIS — Z992 Dependence on renal dialysis: Secondary | ICD-10-CM | POA: Diagnosis not present

## 2020-08-29 DIAGNOSIS — N186 End stage renal disease: Secondary | ICD-10-CM | POA: Diagnosis not present

## 2020-08-29 DIAGNOSIS — E119 Type 2 diabetes mellitus without complications: Secondary | ICD-10-CM | POA: Diagnosis not present

## 2020-08-31 DIAGNOSIS — E119 Type 2 diabetes mellitus without complications: Secondary | ICD-10-CM | POA: Diagnosis not present

## 2020-08-31 DIAGNOSIS — N2581 Secondary hyperparathyroidism of renal origin: Secondary | ICD-10-CM | POA: Diagnosis not present

## 2020-08-31 DIAGNOSIS — N186 End stage renal disease: Secondary | ICD-10-CM | POA: Diagnosis not present

## 2020-08-31 DIAGNOSIS — Z992 Dependence on renal dialysis: Secondary | ICD-10-CM | POA: Diagnosis not present

## 2020-08-31 DIAGNOSIS — E876 Hypokalemia: Secondary | ICD-10-CM | POA: Diagnosis not present

## 2020-09-02 DIAGNOSIS — Z992 Dependence on renal dialysis: Secondary | ICD-10-CM | POA: Diagnosis not present

## 2020-09-02 DIAGNOSIS — E119 Type 2 diabetes mellitus without complications: Secondary | ICD-10-CM | POA: Diagnosis not present

## 2020-09-02 DIAGNOSIS — N186 End stage renal disease: Secondary | ICD-10-CM | POA: Diagnosis not present

## 2020-09-02 DIAGNOSIS — E876 Hypokalemia: Secondary | ICD-10-CM | POA: Diagnosis not present

## 2020-09-02 DIAGNOSIS — N2581 Secondary hyperparathyroidism of renal origin: Secondary | ICD-10-CM | POA: Diagnosis not present

## 2020-09-05 DIAGNOSIS — N2581 Secondary hyperparathyroidism of renal origin: Secondary | ICD-10-CM | POA: Diagnosis not present

## 2020-09-05 DIAGNOSIS — E876 Hypokalemia: Secondary | ICD-10-CM | POA: Diagnosis not present

## 2020-09-05 DIAGNOSIS — N186 End stage renal disease: Secondary | ICD-10-CM | POA: Diagnosis not present

## 2020-09-05 DIAGNOSIS — E119 Type 2 diabetes mellitus without complications: Secondary | ICD-10-CM | POA: Diagnosis not present

## 2020-09-05 DIAGNOSIS — Z992 Dependence on renal dialysis: Secondary | ICD-10-CM | POA: Diagnosis not present

## 2020-09-07 DIAGNOSIS — N186 End stage renal disease: Secondary | ICD-10-CM | POA: Diagnosis not present

## 2020-09-07 DIAGNOSIS — E119 Type 2 diabetes mellitus without complications: Secondary | ICD-10-CM | POA: Diagnosis not present

## 2020-09-07 DIAGNOSIS — N2581 Secondary hyperparathyroidism of renal origin: Secondary | ICD-10-CM | POA: Diagnosis not present

## 2020-09-07 DIAGNOSIS — E876 Hypokalemia: Secondary | ICD-10-CM | POA: Diagnosis not present

## 2020-09-07 DIAGNOSIS — Z992 Dependence on renal dialysis: Secondary | ICD-10-CM | POA: Diagnosis not present

## 2020-09-09 DIAGNOSIS — E876 Hypokalemia: Secondary | ICD-10-CM | POA: Diagnosis not present

## 2020-09-09 DIAGNOSIS — Z992 Dependence on renal dialysis: Secondary | ICD-10-CM | POA: Diagnosis not present

## 2020-09-09 DIAGNOSIS — N2581 Secondary hyperparathyroidism of renal origin: Secondary | ICD-10-CM | POA: Diagnosis not present

## 2020-09-09 DIAGNOSIS — N186 End stage renal disease: Secondary | ICD-10-CM | POA: Diagnosis not present

## 2020-09-09 DIAGNOSIS — E119 Type 2 diabetes mellitus without complications: Secondary | ICD-10-CM | POA: Diagnosis not present

## 2020-09-12 DIAGNOSIS — E119 Type 2 diabetes mellitus without complications: Secondary | ICD-10-CM | POA: Diagnosis not present

## 2020-09-12 DIAGNOSIS — Z992 Dependence on renal dialysis: Secondary | ICD-10-CM | POA: Diagnosis not present

## 2020-09-12 DIAGNOSIS — N2581 Secondary hyperparathyroidism of renal origin: Secondary | ICD-10-CM | POA: Diagnosis not present

## 2020-09-12 DIAGNOSIS — E876 Hypokalemia: Secondary | ICD-10-CM | POA: Diagnosis not present

## 2020-09-12 DIAGNOSIS — N186 End stage renal disease: Secondary | ICD-10-CM | POA: Diagnosis not present

## 2020-09-14 DIAGNOSIS — Z992 Dependence on renal dialysis: Secondary | ICD-10-CM | POA: Diagnosis not present

## 2020-09-14 DIAGNOSIS — E876 Hypokalemia: Secondary | ICD-10-CM | POA: Diagnosis not present

## 2020-09-14 DIAGNOSIS — N186 End stage renal disease: Secondary | ICD-10-CM | POA: Diagnosis not present

## 2020-09-14 DIAGNOSIS — N2581 Secondary hyperparathyroidism of renal origin: Secondary | ICD-10-CM | POA: Diagnosis not present

## 2020-09-14 DIAGNOSIS — E119 Type 2 diabetes mellitus without complications: Secondary | ICD-10-CM | POA: Diagnosis not present

## 2020-09-16 DIAGNOSIS — N186 End stage renal disease: Secondary | ICD-10-CM | POA: Diagnosis not present

## 2020-09-16 DIAGNOSIS — N2581 Secondary hyperparathyroidism of renal origin: Secondary | ICD-10-CM | POA: Diagnosis not present

## 2020-09-16 DIAGNOSIS — E876 Hypokalemia: Secondary | ICD-10-CM | POA: Diagnosis not present

## 2020-09-16 DIAGNOSIS — Z992 Dependence on renal dialysis: Secondary | ICD-10-CM | POA: Diagnosis not present

## 2020-09-16 DIAGNOSIS — E119 Type 2 diabetes mellitus without complications: Secondary | ICD-10-CM | POA: Diagnosis not present

## 2020-09-19 DIAGNOSIS — Z992 Dependence on renal dialysis: Secondary | ICD-10-CM | POA: Diagnosis not present

## 2020-09-19 DIAGNOSIS — E876 Hypokalemia: Secondary | ICD-10-CM | POA: Diagnosis not present

## 2020-09-19 DIAGNOSIS — N186 End stage renal disease: Secondary | ICD-10-CM | POA: Diagnosis not present

## 2020-09-19 DIAGNOSIS — N2581 Secondary hyperparathyroidism of renal origin: Secondary | ICD-10-CM | POA: Diagnosis not present

## 2020-09-19 DIAGNOSIS — E119 Type 2 diabetes mellitus without complications: Secondary | ICD-10-CM | POA: Diagnosis not present

## 2020-09-21 DIAGNOSIS — N186 End stage renal disease: Secondary | ICD-10-CM | POA: Diagnosis not present

## 2020-09-21 DIAGNOSIS — E119 Type 2 diabetes mellitus without complications: Secondary | ICD-10-CM | POA: Diagnosis not present

## 2020-09-21 DIAGNOSIS — N2581 Secondary hyperparathyroidism of renal origin: Secondary | ICD-10-CM | POA: Diagnosis not present

## 2020-09-21 DIAGNOSIS — E876 Hypokalemia: Secondary | ICD-10-CM | POA: Diagnosis not present

## 2020-09-21 DIAGNOSIS — Z992 Dependence on renal dialysis: Secondary | ICD-10-CM | POA: Diagnosis not present

## 2020-09-23 DIAGNOSIS — N2581 Secondary hyperparathyroidism of renal origin: Secondary | ICD-10-CM | POA: Diagnosis not present

## 2020-09-23 DIAGNOSIS — E119 Type 2 diabetes mellitus without complications: Secondary | ICD-10-CM | POA: Diagnosis not present

## 2020-09-23 DIAGNOSIS — E876 Hypokalemia: Secondary | ICD-10-CM | POA: Diagnosis not present

## 2020-09-23 DIAGNOSIS — N186 End stage renal disease: Secondary | ICD-10-CM | POA: Diagnosis not present

## 2020-09-23 DIAGNOSIS — Z992 Dependence on renal dialysis: Secondary | ICD-10-CM | POA: Diagnosis not present

## 2020-09-24 DIAGNOSIS — N186 End stage renal disease: Secondary | ICD-10-CM | POA: Diagnosis not present

## 2020-09-24 DIAGNOSIS — Z992 Dependence on renal dialysis: Secondary | ICD-10-CM | POA: Diagnosis not present

## 2020-09-24 DIAGNOSIS — E1122 Type 2 diabetes mellitus with diabetic chronic kidney disease: Secondary | ICD-10-CM | POA: Diagnosis not present

## 2020-09-26 DIAGNOSIS — E1129 Type 2 diabetes mellitus with other diabetic kidney complication: Secondary | ICD-10-CM | POA: Diagnosis not present

## 2020-09-26 DIAGNOSIS — N2581 Secondary hyperparathyroidism of renal origin: Secondary | ICD-10-CM | POA: Diagnosis not present

## 2020-09-26 DIAGNOSIS — Z992 Dependence on renal dialysis: Secondary | ICD-10-CM | POA: Diagnosis not present

## 2020-09-26 DIAGNOSIS — E119 Type 2 diabetes mellitus without complications: Secondary | ICD-10-CM | POA: Diagnosis not present

## 2020-09-26 DIAGNOSIS — N186 End stage renal disease: Secondary | ICD-10-CM | POA: Diagnosis not present

## 2020-09-26 DIAGNOSIS — D631 Anemia in chronic kidney disease: Secondary | ICD-10-CM | POA: Diagnosis not present

## 2020-09-26 DIAGNOSIS — E876 Hypokalemia: Secondary | ICD-10-CM | POA: Diagnosis not present

## 2020-09-28 DIAGNOSIS — E876 Hypokalemia: Secondary | ICD-10-CM | POA: Diagnosis not present

## 2020-09-28 DIAGNOSIS — E119 Type 2 diabetes mellitus without complications: Secondary | ICD-10-CM | POA: Diagnosis not present

## 2020-09-28 DIAGNOSIS — N186 End stage renal disease: Secondary | ICD-10-CM | POA: Diagnosis not present

## 2020-09-28 DIAGNOSIS — Z992 Dependence on renal dialysis: Secondary | ICD-10-CM | POA: Diagnosis not present

## 2020-09-28 DIAGNOSIS — N2581 Secondary hyperparathyroidism of renal origin: Secondary | ICD-10-CM | POA: Diagnosis not present

## 2020-09-30 DIAGNOSIS — E119 Type 2 diabetes mellitus without complications: Secondary | ICD-10-CM | POA: Diagnosis not present

## 2020-09-30 DIAGNOSIS — N186 End stage renal disease: Secondary | ICD-10-CM | POA: Diagnosis not present

## 2020-09-30 DIAGNOSIS — Z992 Dependence on renal dialysis: Secondary | ICD-10-CM | POA: Diagnosis not present

## 2020-09-30 DIAGNOSIS — E876 Hypokalemia: Secondary | ICD-10-CM | POA: Diagnosis not present

## 2020-09-30 DIAGNOSIS — N2581 Secondary hyperparathyroidism of renal origin: Secondary | ICD-10-CM | POA: Diagnosis not present

## 2020-10-03 DIAGNOSIS — Z992 Dependence on renal dialysis: Secondary | ICD-10-CM | POA: Diagnosis not present

## 2020-10-03 DIAGNOSIS — N186 End stage renal disease: Secondary | ICD-10-CM | POA: Diagnosis not present

## 2020-10-03 DIAGNOSIS — E119 Type 2 diabetes mellitus without complications: Secondary | ICD-10-CM | POA: Diagnosis not present

## 2020-10-03 DIAGNOSIS — N2581 Secondary hyperparathyroidism of renal origin: Secondary | ICD-10-CM | POA: Diagnosis not present

## 2020-10-03 DIAGNOSIS — E876 Hypokalemia: Secondary | ICD-10-CM | POA: Diagnosis not present

## 2020-10-05 DIAGNOSIS — Z992 Dependence on renal dialysis: Secondary | ICD-10-CM | POA: Diagnosis not present

## 2020-10-05 DIAGNOSIS — N186 End stage renal disease: Secondary | ICD-10-CM | POA: Diagnosis not present

## 2020-10-05 DIAGNOSIS — E119 Type 2 diabetes mellitus without complications: Secondary | ICD-10-CM | POA: Diagnosis not present

## 2020-10-05 DIAGNOSIS — N2581 Secondary hyperparathyroidism of renal origin: Secondary | ICD-10-CM | POA: Diagnosis not present

## 2020-10-05 DIAGNOSIS — E876 Hypokalemia: Secondary | ICD-10-CM | POA: Diagnosis not present

## 2020-10-07 DIAGNOSIS — E119 Type 2 diabetes mellitus without complications: Secondary | ICD-10-CM | POA: Diagnosis not present

## 2020-10-07 DIAGNOSIS — E876 Hypokalemia: Secondary | ICD-10-CM | POA: Diagnosis not present

## 2020-10-07 DIAGNOSIS — N2581 Secondary hyperparathyroidism of renal origin: Secondary | ICD-10-CM | POA: Diagnosis not present

## 2020-10-07 DIAGNOSIS — Z992 Dependence on renal dialysis: Secondary | ICD-10-CM | POA: Diagnosis not present

## 2020-10-07 DIAGNOSIS — N186 End stage renal disease: Secondary | ICD-10-CM | POA: Diagnosis not present

## 2020-10-10 DIAGNOSIS — Z992 Dependence on renal dialysis: Secondary | ICD-10-CM | POA: Diagnosis not present

## 2020-10-10 DIAGNOSIS — N186 End stage renal disease: Secondary | ICD-10-CM | POA: Diagnosis not present

## 2020-10-10 DIAGNOSIS — N2581 Secondary hyperparathyroidism of renal origin: Secondary | ICD-10-CM | POA: Diagnosis not present

## 2020-10-10 DIAGNOSIS — E119 Type 2 diabetes mellitus without complications: Secondary | ICD-10-CM | POA: Diagnosis not present

## 2020-10-10 DIAGNOSIS — E876 Hypokalemia: Secondary | ICD-10-CM | POA: Diagnosis not present

## 2020-10-12 DIAGNOSIS — E119 Type 2 diabetes mellitus without complications: Secondary | ICD-10-CM | POA: Diagnosis not present

## 2020-10-12 DIAGNOSIS — N2581 Secondary hyperparathyroidism of renal origin: Secondary | ICD-10-CM | POA: Diagnosis not present

## 2020-10-12 DIAGNOSIS — N186 End stage renal disease: Secondary | ICD-10-CM | POA: Diagnosis not present

## 2020-10-12 DIAGNOSIS — E876 Hypokalemia: Secondary | ICD-10-CM | POA: Diagnosis not present

## 2020-10-12 DIAGNOSIS — E1129 Type 2 diabetes mellitus with other diabetic kidney complication: Secondary | ICD-10-CM | POA: Diagnosis not present

## 2020-10-12 DIAGNOSIS — Z992 Dependence on renal dialysis: Secondary | ICD-10-CM | POA: Diagnosis not present

## 2020-10-13 DIAGNOSIS — Z992 Dependence on renal dialysis: Secondary | ICD-10-CM | POA: Diagnosis not present

## 2020-10-13 DIAGNOSIS — N2581 Secondary hyperparathyroidism of renal origin: Secondary | ICD-10-CM | POA: Diagnosis not present

## 2020-10-13 DIAGNOSIS — E876 Hypokalemia: Secondary | ICD-10-CM | POA: Diagnosis not present

## 2020-10-13 DIAGNOSIS — N186 End stage renal disease: Secondary | ICD-10-CM | POA: Diagnosis not present

## 2020-10-13 DIAGNOSIS — E119 Type 2 diabetes mellitus without complications: Secondary | ICD-10-CM | POA: Diagnosis not present

## 2020-10-14 DIAGNOSIS — E876 Hypokalemia: Secondary | ICD-10-CM | POA: Diagnosis not present

## 2020-10-14 DIAGNOSIS — Z992 Dependence on renal dialysis: Secondary | ICD-10-CM | POA: Diagnosis not present

## 2020-10-14 DIAGNOSIS — N186 End stage renal disease: Secondary | ICD-10-CM | POA: Diagnosis not present

## 2020-10-14 DIAGNOSIS — E119 Type 2 diabetes mellitus without complications: Secondary | ICD-10-CM | POA: Diagnosis not present

## 2020-10-14 DIAGNOSIS — N2581 Secondary hyperparathyroidism of renal origin: Secondary | ICD-10-CM | POA: Diagnosis not present

## 2020-10-17 DIAGNOSIS — N186 End stage renal disease: Secondary | ICD-10-CM | POA: Diagnosis not present

## 2020-10-17 DIAGNOSIS — N2581 Secondary hyperparathyroidism of renal origin: Secondary | ICD-10-CM | POA: Diagnosis not present

## 2020-10-17 DIAGNOSIS — E119 Type 2 diabetes mellitus without complications: Secondary | ICD-10-CM | POA: Diagnosis not present

## 2020-10-17 DIAGNOSIS — E876 Hypokalemia: Secondary | ICD-10-CM | POA: Diagnosis not present

## 2020-10-17 DIAGNOSIS — Z992 Dependence on renal dialysis: Secondary | ICD-10-CM | POA: Diagnosis not present

## 2020-10-19 DIAGNOSIS — E119 Type 2 diabetes mellitus without complications: Secondary | ICD-10-CM | POA: Diagnosis not present

## 2020-10-19 DIAGNOSIS — N186 End stage renal disease: Secondary | ICD-10-CM | POA: Diagnosis not present

## 2020-10-19 DIAGNOSIS — N2581 Secondary hyperparathyroidism of renal origin: Secondary | ICD-10-CM | POA: Diagnosis not present

## 2020-10-19 DIAGNOSIS — E876 Hypokalemia: Secondary | ICD-10-CM | POA: Diagnosis not present

## 2020-10-19 DIAGNOSIS — Z992 Dependence on renal dialysis: Secondary | ICD-10-CM | POA: Diagnosis not present

## 2020-10-21 DIAGNOSIS — N186 End stage renal disease: Secondary | ICD-10-CM | POA: Diagnosis not present

## 2020-10-21 DIAGNOSIS — Z992 Dependence on renal dialysis: Secondary | ICD-10-CM | POA: Diagnosis not present

## 2020-10-21 DIAGNOSIS — E119 Type 2 diabetes mellitus without complications: Secondary | ICD-10-CM | POA: Diagnosis not present

## 2020-10-21 DIAGNOSIS — N2581 Secondary hyperparathyroidism of renal origin: Secondary | ICD-10-CM | POA: Diagnosis not present

## 2020-10-21 DIAGNOSIS — E876 Hypokalemia: Secondary | ICD-10-CM | POA: Diagnosis not present

## 2020-10-24 DIAGNOSIS — E876 Hypokalemia: Secondary | ICD-10-CM | POA: Diagnosis not present

## 2020-10-24 DIAGNOSIS — N186 End stage renal disease: Secondary | ICD-10-CM | POA: Diagnosis not present

## 2020-10-24 DIAGNOSIS — E119 Type 2 diabetes mellitus without complications: Secondary | ICD-10-CM | POA: Diagnosis not present

## 2020-10-24 DIAGNOSIS — Z992 Dependence on renal dialysis: Secondary | ICD-10-CM | POA: Diagnosis not present

## 2020-10-24 DIAGNOSIS — N2581 Secondary hyperparathyroidism of renal origin: Secondary | ICD-10-CM | POA: Diagnosis not present

## 2020-10-25 DIAGNOSIS — N186 End stage renal disease: Secondary | ICD-10-CM | POA: Diagnosis not present

## 2020-10-25 DIAGNOSIS — Z992 Dependence on renal dialysis: Secondary | ICD-10-CM | POA: Diagnosis not present

## 2020-10-25 DIAGNOSIS — E1122 Type 2 diabetes mellitus with diabetic chronic kidney disease: Secondary | ICD-10-CM | POA: Diagnosis not present

## 2020-10-26 DIAGNOSIS — N186 End stage renal disease: Secondary | ICD-10-CM | POA: Diagnosis not present

## 2020-10-26 DIAGNOSIS — Z992 Dependence on renal dialysis: Secondary | ICD-10-CM | POA: Diagnosis not present

## 2020-10-26 DIAGNOSIS — E876 Hypokalemia: Secondary | ICD-10-CM | POA: Diagnosis not present

## 2020-10-26 DIAGNOSIS — N2581 Secondary hyperparathyroidism of renal origin: Secondary | ICD-10-CM | POA: Diagnosis not present

## 2020-10-26 DIAGNOSIS — D631 Anemia in chronic kidney disease: Secondary | ICD-10-CM | POA: Diagnosis not present

## 2020-10-26 DIAGNOSIS — E119 Type 2 diabetes mellitus without complications: Secondary | ICD-10-CM | POA: Diagnosis not present

## 2020-10-27 ENCOUNTER — Other Ambulatory Visit: Payer: Self-pay

## 2020-10-27 ENCOUNTER — Encounter: Payer: Medicare Other | Attending: Psychology | Admitting: Psychology

## 2020-10-27 ENCOUNTER — Encounter: Payer: Self-pay | Admitting: Psychology

## 2020-10-27 DIAGNOSIS — R413 Other amnesia: Secondary | ICD-10-CM | POA: Insufficient documentation

## 2020-10-27 DIAGNOSIS — Z87820 Personal history of traumatic brain injury: Secondary | ICD-10-CM | POA: Diagnosis not present

## 2020-10-27 DIAGNOSIS — F0390 Unspecified dementia without behavioral disturbance: Secondary | ICD-10-CM | POA: Diagnosis not present

## 2020-10-27 NOTE — Progress Notes (Signed)
Neuropsychological Consultation   Patient:   [name]   DOB:   [birth year]  MR Number:  [id number]  Location:  [facility] [place] [facility] [place], [place] [id number] [place] [address] Dept: [phone]           Date of Service:   10/27/2020  Start Time:   10 AM End Time:   12 PM  Today's visit was 2 hours in total. 1 hour and 15 minutes was spent in face-to-face clinical interview with the patient and his wife present and the other 45 minutes was spent in records review, report writing and setting up testing protocols.  Provider/Observer:  Ilean Skill, Psy.D.       Clinical Neuropsychologist       Billing Code/Service: 96116/96121  Chief Complaint:    [name] is a 80 year old male who was referred for neuropsychological evaluation by Sarina Ill, MD after initially being referred for a full neurological work-up by his PCP Cresenciano Lick. Baxley, MD. Reports of significant cognitive changes and progressive memory loss triggered these referrals. Memory difficulties that were progressing date back sometime in the patient has a past medical history of bilateral subdural hematoma status post hematoma evacuation with chronic bilateral hematomas noted. The patient is described as having memory difficulties predating these traumatic subdural hematoma. The patient also has end-stage renal disease on hemodialysis, anemia, secondary hyperparathyroidism, hypertension with intermittent hypotension, dementia, multiple rib fractures, diabetes, hypothyroidism, B12 deficiency and sleep apnea.  Reason for Service:  [name] is a 81 year old male who was referred for neuropsychological evaluation by Sarina Ill, MD after initially being referred for a full neurological work-up by his PCP Cresenciano Lick. Baxley, MD. Reports of significant cognitive changes and progressive memory loss triggered these  referrals. Memory difficulties that were progressing date back sometime in the patient has a past medical history of traumatic bilateral subdural hematoma status post hematoma evacuation with chronic bilateral hematomas noted. The patient is described as having memory difficulties predating these traumatic subdural hematoma. The patient also has end-stage renal disease on hemodialysis, anemia, secondary hyperparathyroidism, hypertension with intermittent hypotension, dementia, multiple rib fractures, diabetes, hypothyroidism, B12 deficiency and sleep apnea.  The patient has an extensive and complicated past medical history. The patient has end-stage renal disease and is on hemodialysis. The patient also is insulin-dependent diabetic along with hyperlipidemia and essential hypertension. The patient has peripheral vascular disease has been diagnosed with obstructive sleep apnea and acute on chronic intracranial subdural hematoma. The patient has been diagnosed with an AVM of small small bowel and has a history of GI bleeding.  The patient had a subdural hematoma on 07/29/2018 with subdural hematoma evacuation. The patient does have a history of depression and when he was first started to be seen by neurology 6 years ago because of memory changes there was report of increased anxiety and depression around his numerous medical issues. He was still active and engaging in life but frustrated by loss of functioning. Over the next several years he continued to have a history of mild cognitive difficulties and reports of memory problems. The patient was started on Aricept sometime around 2018 through the [place] without noted side effects. The patient was recommended to have neuropsychological testing conducted back in 2018 but I was not able to find any records related to previous testing.  During the clinical interview the patient minimized any cognitive difficulties and reported that he  felt like "he was doing much better  and improving." He minimized memory difficulties. However, the patient reports that she is seeing increasing and significant memory issues. She reports that these memory difficulties particular around short-term memory and learning new information predated the traumatic subdural hematoma that he suffered. Patient is described as having a headache every day and that he takes Tylenol both in the morning and afternoon for his headaches and that these headaches are worsening. It is unclear whether these are related directly to his chronic subdural hematoma status but also could be related to rebound headaches from his persistent and regular use of Tylenol. Patient has been instructed to avoid aspirin due to previous GI bleeds and subdural hematoma and also to avoid ibuprofen type medications for both issues related to history of GI bleeds and also his end-stage renal disease.  The patient and his wife both deny any tremors. However, the patient is described to experience some visual hallucinations where he "sees bugs flying around both during the day and at night." The patient's wife reported that these visual hallucinations started about 1 year ago and the patient is insistent on their validity. The patient also has developed concerns about bugs biting him as he has significant issues with itching. The patient has very poor sleep patterns and sometimes will not sleep at all at night. He will usually stay up till 3 or 4 AM due to itching and other difficulties. The patient's significant itching and other sensory changes could have a number of etiological factors including his end-stage renal disease and hemodialysis. The patient also has some fixation over other somatic complaints such as an oily substance developing on his skin and substances in his ears.  The patient has had a number of previous neuro imaging studies conducted. Most recent MRI was conducted on 08/06/2020 and interpretations made by Johnny Colt,  MD. Impressions of this MRI included chronic subdural hematomas over the frontal convexities left greater than right that was seen is stable or mildly improving compared to a CT scan on 05/2020. There was also moderate generalized cortical atrophy noted and mild corpus callosum atrophy. Also noted was scattered T2 hyperintensities foci in the hemispheres and cerebellum consistent with mild chronic microvascular ischemic change.  Patient also had PET scan conducted on 08/23/2020 and interpreted by Johnny Bouchard, MD. Mild decreased relative cortical metabolism within the parietal lobes (left greater than right) was felt to be suggestive of Alzheimer's type pathology. There is also decreased relative cortical metabolism within the left occipital lobe of unclear etiology.  Behavioral Observation: [name]  presents as a 80 y.o.-year-old Right handed African American Male who appeared his stated age. his dress was Appropriate and he was Well Groomed and his manners were Appropriate to the situation.  his participation was indicative of Appropriate and Redirectable behaviors.  There were not physical disabilities noted.  he displayed an appropriate level of cooperation and motivation.     Interactions:    Active Appropriate and Redirectable  Attention:   abnormal and attention span appeared shorter than expected for age  Memory:   abnormal; remote memory intact, recent memory impaired  Visuo-spatial:  not examined  Speech (Volume):  normal  Speech:   normal; normal  Thought Process:  Coherent and Circumstantial  Though Content:  Rumination; hallucinations  Orientation:   person, place and time/date  Judgment:   Fair  Planning:   Poor  Affect:    Anxious  Mood:    Anxious  Insight:   Fair  Intelligence:   normal  Marital Status/Living: The patient was born and raised in [place] with no siblings. He currently lives with his wife of 31 years and they have one  28 year old daughter.  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 He was never terminated from any job.  Substance Use:  No concerns of substance abuse are reported.  Education:   The patient graduated from college with a 4-year bachelor's degree from [employer].  Medical History:   Past Medical History:  Diagnosis Date  . Allergy   . Anemia   . Arthritis   . Cataract    bil cateracts removed  . Coronary artery disease   . Dementia arising in the senium and presenium ([place])   . Diabetes mellitus    Type 2  . Diverticulitis   . ED (erectile dysfunction)   . Elevated homocysteine   . ESRD (end stage renal disease) on dialysis ([place]) 03/2015  . GERD (gastroesophageal reflux disease)    pepto   . Gout   . Hiatal hernia   . Hyperlipidemia   . Hypertension   . Hypothyroidism   . Pneumonia   . PVD (peripheral vascular disease) ([place])    has plastic aorta  . Renal insufficiency   . Seasonal allergies   . Sleep apnea    does not wear c-pap         Patient Active Problem List   Diagnosis Date Noted  . Rectal bleeding 04/22/2020  . AVM (arteriovenous malformation) of small bowel, acquired   . Hiatal hernia   . Schatzki's ring of distal esophagus   . Melena 11/19/2018  . Leukocytosis   . Anemia   . TBI (traumatic brain injury) ([place]) 07/29/2018  . 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 Loose stools   . Dementia arising in the senium and presenium ([place])   . 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 Multiple fractures of ribs, left side, initial encounter for closed fracture 07/21/2018  . Subdural hematoma ([place]) 07/21/2018  . Angiodysplasia of stomach   . Occult GI bleeding   . GIB (gastrointestinal bleeding) 06/01/2018  . Low back pain  01/17/2018  . Anorexia   . Dyspnea   . Macrocytic anemia 06/04/2016  . Thrombocytopenia ([place]) 06/04/2016  . Exertional dyspnea 06/04/2016  . Arm paresthesia, left 06/04/2016  . Dyspnea on exertion 06/04/2016  . Tenderness of right calf 06/04/2016  . Mild cognitive impairment with memory loss 01/12/2016  . ESRD on dialysis ([place]) 05/17/2015  . Hypoglycemia   . Weakness 02/18/2015  . OSA (obstructive sleep apnea) 02/02/2015  . Hyperkalemia 10/05/2014  . End stage renal disease ([place]) 11/18/2013  . BPH (benign prostatic hyperplasia) 11/22/2012  . Peripheral vascular disease ([place]) 12/24/2011  . Hyperparathyroidism ([place]) 12/24/2011  . Hypothyroidism 12/24/2011  . Allergic rhinitis 12/24/2011  . Erectile dysfunction 12/24/2011  . Insulin dependent diabetes mellitus 09/03/2008  . Hyperlipidemia 09/03/2008  . Essential hypertension 08/30/2008  . PANCREATITIS, HX OF 08/30/2008  . RENAL FAILURE, ACUTE, HX OF 08/30/2008  . 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 The patient was in [employer] between 1966 and 1989 reaching his highest rank of [employer].  Psychiatric History:  No prior psychiatric history noted.  Family Med/Psych History:  Family History  Problem Relation Age of Onset  . Aneurysm Mother        brain  . Heart disease Father   . Stroke Father   . Hypertension Father   . Diabetes Father   . Dementia Neg Hx   . Colon cancer Neg Hx   . Esophageal cancer Neg Hx   . Pancreatic cancer Neg Hx   . Prostate cancer Neg Hx   . Rectal cancer Neg Hx   . Stomach cancer Neg Hx    Impression/DX:  [name] is a 80 year old male who was referred for neuropsychological evaluation by Sarina Ill, MD after initially being referred for a full neurological work-up by  his PCP Cresenciano Lick. Baxley, MD. Reports of significant cognitive changes and progressive memory loss triggered these referrals. Memory difficulties that were progressing date back sometime in the patient has a past medical history of traumatic bilateral subdural hematoma status post hematoma evacuation with chronic bilateral hematomas noted. The patient is described as having memory difficulties predating these traumatic subdural hematoma. The patient also has end-stage renal disease on hemodialysis, anemia, secondary hyperparathyroidism, hypertension with intermittent hypotension, dementia, multiple rib fractures, diabetes, hypothyroidism, B12 deficiency and sleep apnea.  The patient does have reports of progressing memory loss and confusion that predated his TBI with subdural hematoma. He continues to have residual indications of this TBI seen on recent MRI scans. It does appear to be chronic and showing some improvement over time. The patient has developed some visual hallucinations and/or delusions around bugs and changes in his body but he is having significant itching sensation that is also associated with significant insomnia and sleep disturbance.  The patient is described as having daily headaches that are treated with Tylenol. A number of etiological factors could be playing a role including his history of TBI with subdural hematoma, possible rebound headache with regular use of Tylenol, and possible side effects of his Aricept. He also has significant complaints of itching throughout his body. The itching could be a result of his end-stage renal disease and hemodialysis "you know peripheral neuropathy although he reports itching all over his body even though it is worse on his lower legs. While the patient attributes this itching sensation to his sleep difficulties insomnia is a possible side effect of his Aricept.  Disposition/Plan:  We have set the patient up for formal testing. We will administer the  RBANS repeatable neuropsychological battery as a foundational evaluation and during this evaluation. A determination as to other possible measures used including more extensive memory testing.  Diagnosis:    Dementia without behavioral disturbance, unspecified dementia type ([place])  Memory loss  History of traumatic brain injury         Electronically Signed   _______________________ Ilean Skill, Psy.D. Clinical Neuropsychologist

## 2020-10-28 DIAGNOSIS — D631 Anemia in chronic kidney disease: Secondary | ICD-10-CM | POA: Diagnosis not present

## 2020-10-28 DIAGNOSIS — E119 Type 2 diabetes mellitus without complications: Secondary | ICD-10-CM | POA: Diagnosis not present

## 2020-10-28 DIAGNOSIS — E876 Hypokalemia: Secondary | ICD-10-CM | POA: Diagnosis not present

## 2020-10-28 DIAGNOSIS — Z992 Dependence on renal dialysis: Secondary | ICD-10-CM | POA: Diagnosis not present

## 2020-10-28 DIAGNOSIS — N186 End stage renal disease: Secondary | ICD-10-CM | POA: Diagnosis not present

## 2020-10-31 DIAGNOSIS — E876 Hypokalemia: Secondary | ICD-10-CM | POA: Diagnosis not present

## 2020-10-31 DIAGNOSIS — E119 Type 2 diabetes mellitus without complications: Secondary | ICD-10-CM | POA: Diagnosis not present

## 2020-10-31 DIAGNOSIS — D631 Anemia in chronic kidney disease: Secondary | ICD-10-CM | POA: Diagnosis not present

## 2020-10-31 DIAGNOSIS — N186 End stage renal disease: Secondary | ICD-10-CM | POA: Diagnosis not present

## 2020-10-31 DIAGNOSIS — Z992 Dependence on renal dialysis: Secondary | ICD-10-CM | POA: Diagnosis not present

## 2020-11-02 DIAGNOSIS — D631 Anemia in chronic kidney disease: Secondary | ICD-10-CM | POA: Diagnosis not present

## 2020-11-02 DIAGNOSIS — Z992 Dependence on renal dialysis: Secondary | ICD-10-CM | POA: Diagnosis not present

## 2020-11-02 DIAGNOSIS — N186 End stage renal disease: Secondary | ICD-10-CM | POA: Diagnosis not present

## 2020-11-02 DIAGNOSIS — E876 Hypokalemia: Secondary | ICD-10-CM | POA: Diagnosis not present

## 2020-11-02 DIAGNOSIS — E119 Type 2 diabetes mellitus without complications: Secondary | ICD-10-CM | POA: Diagnosis not present

## 2020-11-04 DIAGNOSIS — Z992 Dependence on renal dialysis: Secondary | ICD-10-CM | POA: Diagnosis not present

## 2020-11-04 DIAGNOSIS — E876 Hypokalemia: Secondary | ICD-10-CM | POA: Diagnosis not present

## 2020-11-04 DIAGNOSIS — D631 Anemia in chronic kidney disease: Secondary | ICD-10-CM | POA: Diagnosis not present

## 2020-11-04 DIAGNOSIS — E119 Type 2 diabetes mellitus without complications: Secondary | ICD-10-CM | POA: Diagnosis not present

## 2020-11-04 DIAGNOSIS — N186 End stage renal disease: Secondary | ICD-10-CM | POA: Diagnosis not present

## 2020-11-07 ENCOUNTER — Emergency Department (HOSPITAL_COMMUNITY): Payer: Medicare Other

## 2020-11-07 ENCOUNTER — Inpatient Hospital Stay (HOSPITAL_COMMUNITY)
Admission: EM | Admit: 2020-11-07 | Discharge: 2020-11-16 | DRG: 480 | Disposition: A | Discharge status: Skilled Nursing Facility | Payer: Medicare Other | Attending: Internal Medicine | Admitting: Internal Medicine

## 2020-11-07 ENCOUNTER — Encounter (HOSPITAL_COMMUNITY): Payer: Self-pay | Admitting: Internal Medicine

## 2020-11-07 ENCOUNTER — Other Ambulatory Visit: Payer: Self-pay

## 2020-11-07 ENCOUNTER — Inpatient Hospital Stay (HOSPITAL_COMMUNITY): Payer: Medicare Other

## 2020-11-07 DIAGNOSIS — N186 End stage renal disease: Secondary | ICD-10-CM | POA: Diagnosis present

## 2020-11-07 DIAGNOSIS — Z87891 Personal history of nicotine dependence: Secondary | ICD-10-CM | POA: Diagnosis not present

## 2020-11-07 DIAGNOSIS — Z01818 Encounter for other preprocedural examination: Secondary | ICD-10-CM

## 2020-11-07 DIAGNOSIS — Z4789 Encounter for other orthopedic aftercare: Secondary | ICD-10-CM | POA: Diagnosis not present

## 2020-11-07 DIAGNOSIS — E785 Hyperlipidemia, unspecified: Secondary | ICD-10-CM | POA: Diagnosis present

## 2020-11-07 DIAGNOSIS — Z8249 Family history of ischemic heart disease and other diseases of the circulatory system: Secondary | ICD-10-CM | POA: Diagnosis not present

## 2020-11-07 DIAGNOSIS — Z79890 Hormone replacement therapy (postmenopausal): Secondary | ICD-10-CM | POA: Diagnosis not present

## 2020-11-07 DIAGNOSIS — R29898 Other symptoms and signs involving the musculoskeletal system: Secondary | ICD-10-CM | POA: Diagnosis not present

## 2020-11-07 DIAGNOSIS — R41841 Cognitive communication deficit: Secondary | ICD-10-CM | POA: Diagnosis not present

## 2020-11-07 DIAGNOSIS — I2510 Atherosclerotic heart disease of native coronary artery without angina pectoris: Secondary | ICD-10-CM | POA: Diagnosis present

## 2020-11-07 DIAGNOSIS — D631 Anemia in chronic kidney disease: Secondary | ICD-10-CM | POA: Diagnosis not present

## 2020-11-07 DIAGNOSIS — Y92009 Unspecified place in unspecified non-institutional (private) residence as the place of occurrence of the external cause: Secondary | ICD-10-CM | POA: Diagnosis not present

## 2020-11-07 DIAGNOSIS — Z20822 Contact with and (suspected) exposure to covid-19: Secondary | ICD-10-CM | POA: Diagnosis present

## 2020-11-07 DIAGNOSIS — W19XXXA Unspecified fall, initial encounter: Secondary | ICD-10-CM | POA: Diagnosis not present

## 2020-11-07 DIAGNOSIS — Z833 Family history of diabetes mellitus: Secondary | ICD-10-CM | POA: Diagnosis not present

## 2020-11-07 DIAGNOSIS — I5032 Chronic diastolic (congestive) heart failure: Secondary | ICD-10-CM | POA: Diagnosis present

## 2020-11-07 DIAGNOSIS — S72144S Nondisplaced intertrochanteric fracture of right femur, sequela: Secondary | ICD-10-CM | POA: Diagnosis not present

## 2020-11-07 DIAGNOSIS — I4891 Unspecified atrial fibrillation: Secondary | ICD-10-CM | POA: Diagnosis not present

## 2020-11-07 DIAGNOSIS — G4733 Obstructive sleep apnea (adult) (pediatric): Secondary | ICD-10-CM | POA: Diagnosis present

## 2020-11-07 DIAGNOSIS — E1122 Type 2 diabetes mellitus with diabetic chronic kidney disease: Secondary | ICD-10-CM | POA: Diagnosis present

## 2020-11-07 DIAGNOSIS — I959 Hypotension, unspecified: Secondary | ICD-10-CM | POA: Diagnosis not present

## 2020-11-07 DIAGNOSIS — R1312 Dysphagia, oropharyngeal phase: Secondary | ICD-10-CM | POA: Diagnosis not present

## 2020-11-07 DIAGNOSIS — I471 Supraventricular tachycardia: Secondary | ICD-10-CM | POA: Diagnosis present

## 2020-11-07 DIAGNOSIS — I120 Hypertensive chronic kidney disease with stage 5 chronic kidney disease or end stage renal disease: Secondary | ICD-10-CM | POA: Diagnosis not present

## 2020-11-07 DIAGNOSIS — W1830XA Fall on same level, unspecified, initial encounter: Secondary | ICD-10-CM | POA: Diagnosis present

## 2020-11-07 DIAGNOSIS — E875 Hyperkalemia: Secondary | ICD-10-CM | POA: Diagnosis not present

## 2020-11-07 DIAGNOSIS — Z888 Allergy status to other drugs, medicaments and biological substances status: Secondary | ICD-10-CM | POA: Diagnosis not present

## 2020-11-07 DIAGNOSIS — Z043 Encounter for examination and observation following other accident: Secondary | ICD-10-CM | POA: Diagnosis not present

## 2020-11-07 DIAGNOSIS — N2581 Secondary hyperparathyroidism of renal origin: Secondary | ICD-10-CM | POA: Diagnosis present

## 2020-11-07 DIAGNOSIS — Z7401 Bed confinement status: Secondary | ICD-10-CM | POA: Diagnosis not present

## 2020-11-07 DIAGNOSIS — D696 Thrombocytopenia, unspecified: Secondary | ICD-10-CM | POA: Diagnosis not present

## 2020-11-07 DIAGNOSIS — R262 Difficulty in walking, not elsewhere classified: Secondary | ICD-10-CM | POA: Diagnosis not present

## 2020-11-07 DIAGNOSIS — M2550 Pain in unspecified joint: Secondary | ICD-10-CM | POA: Diagnosis not present

## 2020-11-07 DIAGNOSIS — E119 Type 2 diabetes mellitus without complications: Secondary | ICD-10-CM

## 2020-11-07 DIAGNOSIS — Z880 Allergy status to penicillin: Secondary | ICD-10-CM

## 2020-11-07 DIAGNOSIS — K219 Gastro-esophageal reflux disease without esophagitis: Secondary | ICD-10-CM | POA: Diagnosis present

## 2020-11-07 DIAGNOSIS — S72101D Unspecified trochanteric fracture of right femur, subsequent encounter for closed fracture with routine healing: Secondary | ICD-10-CM | POA: Diagnosis not present

## 2020-11-07 DIAGNOSIS — N400 Enlarged prostate without lower urinary tract symptoms: Secondary | ICD-10-CM | POA: Diagnosis present

## 2020-11-07 DIAGNOSIS — Z823 Family history of stroke: Secondary | ICD-10-CM

## 2020-11-07 DIAGNOSIS — Z9012 Acquired absence of left breast and nipple: Secondary | ICD-10-CM

## 2020-11-07 DIAGNOSIS — Z992 Dependence on renal dialysis: Secondary | ICD-10-CM | POA: Diagnosis not present

## 2020-11-07 DIAGNOSIS — I132 Hypertensive heart and chronic kidney disease with heart failure and with stage 5 chronic kidney disease, or end stage renal disease: Secondary | ICD-10-CM | POA: Diagnosis present

## 2020-11-07 DIAGNOSIS — R269 Unspecified abnormalities of gait and mobility: Secondary | ICD-10-CM | POA: Diagnosis not present

## 2020-11-07 DIAGNOSIS — I10 Essential (primary) hypertension: Secondary | ICD-10-CM | POA: Diagnosis present

## 2020-11-07 DIAGNOSIS — D649 Anemia, unspecified: Secondary | ICD-10-CM | POA: Diagnosis present

## 2020-11-07 DIAGNOSIS — M545 Low back pain, unspecified: Secondary | ICD-10-CM | POA: Diagnosis not present

## 2020-11-07 DIAGNOSIS — I5031 Acute diastolic (congestive) heart failure: Secondary | ICD-10-CM | POA: Diagnosis not present

## 2020-11-07 DIAGNOSIS — R2681 Unsteadiness on feet: Secondary | ICD-10-CM | POA: Diagnosis not present

## 2020-11-07 DIAGNOSIS — F0390 Unspecified dementia without behavioral disturbance: Secondary | ICD-10-CM | POA: Diagnosis present

## 2020-11-07 DIAGNOSIS — S72144A Nondisplaced intertrochanteric fracture of right femur, initial encounter for closed fracture: Principal | ICD-10-CM

## 2020-11-07 DIAGNOSIS — Z79899 Other long term (current) drug therapy: Secondary | ICD-10-CM

## 2020-11-07 DIAGNOSIS — S72009A Fracture of unspecified part of neck of unspecified femur, initial encounter for closed fracture: Secondary | ICD-10-CM | POA: Diagnosis not present

## 2020-11-07 DIAGNOSIS — E1140 Type 2 diabetes mellitus with diabetic neuropathy, unspecified: Secondary | ICD-10-CM | POA: Diagnosis present

## 2020-11-07 DIAGNOSIS — S72141A Displaced intertrochanteric fracture of right femur, initial encounter for closed fracture: Secondary | ICD-10-CM | POA: Diagnosis not present

## 2020-11-07 DIAGNOSIS — Z8679 Personal history of other diseases of the circulatory system: Secondary | ICD-10-CM

## 2020-11-07 DIAGNOSIS — E039 Hypothyroidism, unspecified: Secondary | ICD-10-CM | POA: Diagnosis present

## 2020-11-07 DIAGNOSIS — I700 Atherosclerosis of aorta: Secondary | ICD-10-CM | POA: Diagnosis present

## 2020-11-07 DIAGNOSIS — Z751 Person awaiting admission to adequate facility elsewhere: Secondary | ICD-10-CM

## 2020-11-07 DIAGNOSIS — Z419 Encounter for procedure for purposes other than remedying health state, unspecified: Secondary | ICD-10-CM

## 2020-11-07 DIAGNOSIS — S79929A Unspecified injury of unspecified thigh, initial encounter: Secondary | ICD-10-CM | POA: Diagnosis not present

## 2020-11-07 DIAGNOSIS — J9811 Atelectasis: Secondary | ICD-10-CM | POA: Diagnosis not present

## 2020-11-07 DIAGNOSIS — Z794 Long term (current) use of insulin: Secondary | ICD-10-CM | POA: Diagnosis not present

## 2020-11-07 DIAGNOSIS — R079 Chest pain, unspecified: Secondary | ICD-10-CM

## 2020-11-07 DIAGNOSIS — M6281 Muscle weakness (generalized): Secondary | ICD-10-CM | POA: Diagnosis not present

## 2020-11-07 LAB — COMPREHENSIVE METABOLIC PANEL
ALT: 12 U/L (ref 0–44)
AST: 21 U/L (ref 15–41)
Albumin: 2.9 g/dL — ABNORMAL LOW (ref 3.5–5.0)
Alkaline Phosphatase: 83 U/L (ref 38–126)
Anion gap: 13 (ref 5–15)
BUN: 49 mg/dL — ABNORMAL HIGH (ref 8–23)
CO2: 26 mmol/L (ref 22–32)
Calcium: 9.8 mg/dL (ref 8.9–10.3)
Chloride: 101 mmol/L (ref 98–111)
Creatinine, Ser: 10.06 mg/dL — ABNORMAL HIGH (ref 0.61–1.24)
GFR, Estimated: 5 mL/min — ABNORMAL LOW (ref >60)
Glucose, Bld: 134 mg/dL — ABNORMAL HIGH (ref 70–99)
Potassium: 4.2 mmol/L (ref 3.5–5.1)
Sodium: 140 mmol/L (ref 135–145)
Total Bilirubin: 0.6 mg/dL (ref 0.3–1.2)
Total Protein: 6 g/dL — ABNORMAL LOW (ref 6.5–8.1)

## 2020-11-07 LAB — CBC WITH DIFFERENTIAL/PLATELET
Abs Immature Granulocytes: 0.06 10*3/uL (ref 0.00–0.07)
Basophils Absolute: 0.1 10*3/uL (ref 0.0–0.1)
Basophils Relative: 1 %
Eosinophils Absolute: 0.3 10*3/uL (ref 0.0–0.5)
Eosinophils Relative: 3 %
HCT: 30.8 % — ABNORMAL LOW (ref 39.0–52.0)
Hemoglobin: 10.3 g/dL — ABNORMAL LOW (ref 13.0–17.0)
Immature Granulocytes: 1 %
Lymphocytes Relative: 18 %
Lymphs Abs: 1.8 10*3/uL (ref 0.7–4.0)
MCH: 35.3 pg — ABNORMAL HIGH (ref 26.0–34.0)
MCHC: 33.4 g/dL (ref 30.0–36.0)
MCV: 105.5 fL — ABNORMAL HIGH (ref 80.0–100.0)
Monocytes Absolute: 1 10*3/uL (ref 0.1–1.0)
Monocytes Relative: 10 %
Neutro Abs: 6.9 10*3/uL (ref 1.7–7.7)
Neutrophils Relative %: 67 %
Platelets: 163 10*3/uL (ref 150–400)
RBC: 2.92 MIL/uL — ABNORMAL LOW (ref 4.22–5.81)
RDW: 17.2 % — ABNORMAL HIGH (ref 11.5–15.5)
WBC: 10 10*3/uL (ref 4.0–10.5)
nRBC: 0 % (ref 0.0–0.2)

## 2020-11-07 LAB — ECHOCARDIOGRAM COMPLETE
Area-P 1/2: 4.41 cm2
S' Lateral: 2.4 cm

## 2020-11-07 LAB — CBG MONITORING, ED: Glucose-Capillary: 120 mg/dL — ABNORMAL HIGH (ref 70–99)

## 2020-11-07 LAB — TYPE AND SCREEN
ABO/RH(D): O POS
Antibody Screen: NEGATIVE

## 2020-11-07 LAB — RESP PANEL BY RT-PCR (FLU A&B, COVID) ARPGX2
Influenza A by PCR: NEGATIVE
Influenza B by PCR: NEGATIVE
SARS Coronavirus 2 by RT PCR: NEGATIVE

## 2020-11-07 LAB — BRAIN NATRIURETIC PEPTIDE: B Natriuretic Peptide: 484.7 pg/mL — ABNORMAL HIGH (ref 0.0–100.0)

## 2020-11-07 MED ORDER — DOCUSATE SODIUM 100 MG PO CAPS
100.0000 mg | ORAL_CAPSULE | Freq: Two times a day (BID) | ORAL | Status: DC
  Administered 2020-11-07 – 2020-11-16 (×16): 100 mg via ORAL
  Filled 2020-11-07 (×16): qty 1

## 2020-11-07 MED ORDER — ALLOPURINOL 100 MG PO TABS
100.0000 mg | ORAL_TABLET | Freq: Every day | ORAL | Status: DC
  Filled 2020-11-07: qty 1

## 2020-11-07 MED ORDER — MIDODRINE HCL 5 MG PO TABS
10.0000 mg | ORAL_TABLET | ORAL | Status: DC
  Administered 2020-11-09 – 2020-11-11 (×2): 10 mg via ORAL
  Filled 2020-11-07: qty 2

## 2020-11-07 MED ORDER — SIMVASTATIN 20 MG PO TABS
20.0000 mg | ORAL_TABLET | Freq: Every day | ORAL | Status: DC
  Administered 2020-11-07 – 2020-11-15 (×9): 20 mg via ORAL
  Filled 2020-11-07 (×9): qty 1

## 2020-11-07 MED ORDER — HYDROCORTISONE 2.5 % EX CREA: 1.0000 "application " | TOPICAL_CREAM | Freq: Three times a day (TID) | CUTANEOUS | Status: DC | PRN

## 2020-11-07 MED ORDER — INSULIN ASPART 100 UNIT/ML ~~LOC~~ SOLN
0.0000 [IU] | Freq: Three times a day (TID) | SUBCUTANEOUS | Status: DC
  Administered 2020-11-08 – 2020-11-11 (×5): 1 [IU] via SUBCUTANEOUS
  Administered 2020-11-12 – 2020-11-13 (×2): 2 [IU] via SUBCUTANEOUS
  Administered 2020-11-14: 1 [IU] via SUBCUTANEOUS

## 2020-11-07 MED ORDER — ONDANSETRON HCL 4 MG/2ML IJ SOLN
4.0000 mg | Freq: Once | INTRAMUSCULAR | Status: AC
  Administered 2020-11-07: 4 mg via INTRAVENOUS
  Filled 2020-11-07: qty 2

## 2020-11-07 MED ORDER — CARBOXYMETHYLCELLULOSE SODIUM 0.25 % OP SOLN: 1.0000 [drp] | OPHTHALMIC | Status: DC | PRN

## 2020-11-07 MED ORDER — HYDROCODONE-ACETAMINOPHEN 5-325 MG PO TABS
1.0000 | ORAL_TABLET | Freq: Four times a day (QID) | ORAL | Status: DC | PRN
  Administered 2020-11-08 – 2020-11-15 (×5): 1 via ORAL
  Filled 2020-11-07 (×5): qty 1

## 2020-11-07 MED ORDER — MEMANTINE HCL 10 MG PO TABS
10.0000 mg | ORAL_TABLET | Freq: Two times a day (BID) | ORAL | Status: DC
Start: 2020-11-07 — End: 2020-11-16
  Administered 2020-11-07 – 2020-11-16 (×17): 10 mg via ORAL
  Filled 2020-11-07 (×19): qty 1

## 2020-11-07 MED ORDER — SEVELAMER CARBONATE 800 MG PO TABS: 1600.0000 mg | ORAL_TABLET | ORAL | Status: DC

## 2020-11-07 MED ORDER — CLOBETASOL PROPIONATE 0.05 % EX SOLN: 1.0000 "application " | Freq: Two times a day (BID) | CUTANEOUS | Status: DC | PRN

## 2020-11-07 MED ORDER — PANTOPRAZOLE SODIUM 40 MG PO TBEC
40.0000 mg | DELAYED_RELEASE_TABLET | Freq: Every day | ORAL | Status: DC
  Administered 2020-11-09 – 2020-11-16 (×8): 40 mg via ORAL
  Filled 2020-11-07 (×7): qty 1

## 2020-11-07 MED ORDER — ALBUTEROL SULFATE (2.5 MG/3ML) 0.083% IN NEBU: 2.5000 mg | INHALATION_SOLUTION | Freq: Every day | RESPIRATORY_TRACT | Status: DC | PRN

## 2020-11-07 MED ORDER — LORATADINE 10 MG PO TABS
10.0000 mg | ORAL_TABLET | Freq: Every day | ORAL | Status: DC
  Administered 2020-11-09 – 2020-11-16 (×8): 10 mg via ORAL
  Filled 2020-11-07 (×7): qty 1

## 2020-11-07 MED ORDER — GABAPENTIN 100 MG PO CAPS
100.0000 mg | ORAL_CAPSULE | Freq: Every day | ORAL | Status: DC
  Administered 2020-11-07 – 2020-11-15 (×9): 100 mg via ORAL
  Filled 2020-11-07 (×9): qty 1

## 2020-11-07 MED ORDER — CHLORHEXIDINE GLUCONATE CLOTH 2 % EX PADS
6.0000 | MEDICATED_PAD | Freq: Every day | CUTANEOUS | Status: DC
  Administered 2020-11-09 – 2020-11-16 (×7): 6 via TOPICAL

## 2020-11-07 MED ORDER — FENTANYL CITRATE (PF) 100 MCG/2ML IJ SOLN
12.5000 ug | Freq: Once | INTRAMUSCULAR | Status: AC
  Administered 2020-11-07: 12.5 ug via INTRAVENOUS
  Filled 2020-11-07: qty 2

## 2020-11-07 MED ORDER — DONEPEZIL HCL 10 MG PO TABS
10.0000 mg | ORAL_TABLET | Freq: Every day | ORAL | Status: DC
  Administered 2020-11-07 – 2020-11-15 (×9): 10 mg via ORAL
  Filled 2020-11-07 (×9): qty 1

## 2020-11-07 MED ORDER — SEVELAMER CARBONATE 800 MG PO TABS: 1600.0000 mg | ORAL_TABLET | ORAL | Status: DC | PRN

## 2020-11-07 MED ORDER — LORATADINE 10 MG PO TABS
10.0000 mg | ORAL_TABLET | Freq: Every day | ORAL | Status: DC
  Filled 2020-11-07: qty 1

## 2020-11-07 MED ORDER — HYDROMORPHONE HCL 1 MG/ML IJ SOLN
0.5000 mg | INTRAMUSCULAR | Status: DC | PRN
  Administered 2020-11-07: 0.5 mg via INTRAVENOUS
  Filled 2020-11-07: qty 0.5

## 2020-11-07 MED ORDER — ACETAMINOPHEN 325 MG PO TABS
325.0000 mg | ORAL_TABLET | ORAL | Status: DC | PRN
  Administered 2020-11-11 – 2020-11-15 (×4): 650 mg via ORAL
  Filled 2020-11-07 (×4): qty 2

## 2020-11-07 MED ORDER — FLUOCINOLONE ACETONIDE SCALP 0.01 % EX OIL: 1.0000 "application " | TOPICAL_OIL | Freq: Every day | CUTANEOUS | Status: DC | PRN

## 2020-11-07 MED ORDER — HYDRALAZINE HCL 25 MG PO TABS
25.0000 mg | ORAL_TABLET | Freq: Four times a day (QID) | ORAL | Status: DC | PRN
  Administered 2020-11-08: 25 mg via ORAL
  Filled 2020-11-07: qty 1

## 2020-11-07 MED ORDER — ALLOPURINOL 100 MG PO TABS
100.0000 mg | ORAL_TABLET | Freq: Every day | ORAL | Status: DC
  Administered 2020-11-10 – 2020-11-16 (×7): 100 mg via ORAL
  Filled 2020-11-07 (×8): qty 1

## 2020-11-07 MED ORDER — SEVELAMER CARBONATE 800 MG PO TABS
2400.0000 mg | ORAL_TABLET | ORAL | Status: DC
  Administered 2020-11-10 – 2020-11-15 (×11): 2400 mg via ORAL
  Filled 2020-11-07 (×11): qty 3

## 2020-11-07 MED ORDER — LEVOTHYROXINE SODIUM 50 MCG PO TABS
50.0000 ug | ORAL_TABLET | Freq: Every day | ORAL | Status: DC
  Administered 2020-11-09 – 2020-11-16 (×8): 50 ug via ORAL
  Filled 2020-11-07 (×8): qty 1

## 2020-11-07 MED ORDER — FLUTICASONE PROPIONATE 50 MCG/ACT NA SUSP
1.0000 | Freq: Every day | NASAL | Status: DC
  Administered 2020-11-09 – 2020-11-14 (×6): 1 via NASAL
  Filled 2020-11-07: qty 16

## 2020-11-07 MED ORDER — LINAGLIPTIN 5 MG PO TABS
5.0000 mg | ORAL_TABLET | Freq: Every day | ORAL | Status: DC
Start: 2020-11-08 — End: 2020-11-16
  Administered 2020-11-09 – 2020-11-16 (×8): 5 mg via ORAL
  Filled 2020-11-07 (×8): qty 1

## 2020-11-07 MED ORDER — SEVELAMER CARBONATE 800 MG PO TABS
2400.0000 mg | ORAL_TABLET | ORAL | Status: DC
  Administered 2020-11-11 – 2020-11-14 (×3): 2400 mg via ORAL
  Filled 2020-11-07 (×3): qty 3

## 2020-11-07 MED ORDER — LIDOCAINE 5 % EX PTCH: 1.0000 | MEDICATED_PATCH | Freq: Every day | CUTANEOUS | Status: DC | PRN

## 2020-11-07 MED ORDER — NEPRO/CARBSTEADY PO LIQD
237.0000 mL | Freq: Three times a day (TID) | ORAL | Status: DC
  Administered 2020-11-08 – 2020-11-16 (×21): 237 mL via ORAL
  Filled 2020-11-07: qty 237

## 2020-11-07 NOTE — ED Notes (Signed)
Dinner Tray Ordered @ [phone].

## 2020-11-07 NOTE — ED Triage Notes (Addendum)
Pt with hx of dementia via EMS from home for eval of R hip and leg pain after falling yesterday. EMS sts pt unable to bear weight. Pt's only position of comfort is lying flat. MWF dialysis pt, missed dialysis today d/t pain.

## 2020-11-07 NOTE — ED Notes (Signed)
Pt moved to room 42.

## 2020-11-07 NOTE — Consult Note (Signed)
[facility] Renal Consultation Note    Indication for Consultation:  Management of ESRD/hemodialysis; anemia, hypertension/volume and secondary hyperparathyroidism  MBT:DHRCBU, Cresenciano Lick, MD  HPI: [name] is a 80 y.o. male. 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  Patient is complaint with prescribed dialysis regiman.   Patient presented to the ED due to Fall.  Reports he fell twice on Saturday due to his knees giving out.  Denies dizziness preceding fall or LOC.  States he got up following the 1st fall and fell again but was unable to get up. Wife called his neighbor who helped him get into bed.  Continued to have pain so decided he should be checked out.  Last dialysis on Friday 11/04/20.  Denies CP, SOB, n/v/d, abdominal pain, dizziness and fatigue.    Pertinent findings in the ED include K 4.2, BUN 49, SCr 10, alb 2.9, CT head/spine with no acute abnormalities, CXR shows slight left base atelectasis, X ray R hip with intertrochanteric femur fracture with mild impaction at site.  Patient has been admitted for further evaluation and management.   Past Medical History:  Diagnosis Date  . Allergy   . Anemia   . Arthritis   . Cataract    bil cateracts removed  . Coronary artery disease   . Dementia arising in the senium and presenium ([place])   . Diabetes mellitus    Type 2  . Diverticulitis   . ED (erectile dysfunction)   . Elevated homocysteine   . ESRD (end stage renal disease) on dialysis ([place]) 03/2015  . GERD (gastroesophageal reflux disease)    pepto   . Gout   . Hiatal hernia   . Hyperlipidemia   . Hypertension   . Hypothyroidism   . Pneumonia   . PVD (peripheral vascular disease) ([place])    has plastic aorta  . Renal insufficiency   . Seasonal allergies   . Sleep  apnea    does not wear c-pap   Past Surgical History:  Procedure Laterality Date  . aortobifemoral bypass    . AV FISTULA PLACEMENT Left 12/01/2013   Procedure: ARTERIOVENOUS (AV) FISTULA CREATION- LEFT BRACHIOCEPHALIC;  Surgeon: Angelia Mould, MD;  Location: [place];  Service: Vascular;  Laterality: Left;  . [place] TRANSPOSITION Right 07/27/2014   Procedure: BASCILIC VEIN TRANSPOSITION;  Surgeon: Angelia Mould, MD;  Location: [place];  Service: Vascular;  Laterality: Right;  . BRAIN SURGERY  07/22/2018  . BREAST SURGERY     left - granulomatous mastitis  . BURR HOLE Bilateral 07/22/2018   Procedure: BILATERAL BURR HOLES;  Surgeon: Kristeen Miss, MD;  Location: [place];  Service: Neurosurgery;  Laterality: Bilateral;  . COLONOSCOPY    . 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 ([place] HX)  1992  . ENTEROSCOPY N/A 06/03/2018   Procedure: ENTEROSCOPY;  Surgeon: Lavena Bullion, DO;  Location: [place];  Service: Gastroenterology;  Laterality: N/A;  . ENTEROSCOPY N/A 11/20/2018   Procedure: ENTEROSCOPY;  Surgeon: Rush Landmark Telford Nab., MD;  Location: [facility] ENDOSCOPY;  Service: Gastroenterology;  Laterality: N/A;  . ENTEROSCOPY N/A 12/03/2019   Procedure: ENTEROSCOPY;  Surgeon: Lavena Bullion, DO;  Location: [facility] ENDOSCOPY;  Service: Gastroenterology;  Laterality: N/A;  push enteroscopy  . EYE SURGERY Bilateral    cataracts  .  HEMODIALYSIS INPATIENT  01/17/2018      . HOT HEMOSTASIS N/A 06/03/2018   Procedure: HOT HEMOSTASIS (ARGON PLASMA COAGULATION/BICAP);  Surgeon: Cirigliano, Vito V, DO;  Location: [facility] ENDOSCOPY;  Service: Gastroenterology;  Laterality: N/A;  . HOT HEMOSTASIS N/A 11/20/2018   Procedure: HOT HEMOSTASIS (ARGON PLASMA COAGULATION/BICAP);  Surgeon: Mansouraty, Gabriel Jr., MD;  Location: [facility] ENDOSCOPY;  Service: Gastroenterology;  Laterality: N/A;  . HOT HEMOSTASIS N/A 12/03/2019   Procedure: HOT HEMOSTASIS (ARGON PLASMA COAGULATION/BICAP);  Surgeon: Cirigliano, Vito  V, DO;  Location: [facility] ENDOSCOPY;  Service: Gastroenterology;  Laterality: N/A;  . REVISION OF ARTERIOVENOUS GORETEX GRAFT Right 09/01/2019   Procedure: REVISION OF ARTERIOVENOUS FISTULA RIGHT ARM;  Surgeon: Brabham, Vance W, MD;  Location: [facility] OR;  Service: Vascular;  Laterality: Right;  . REVISON OF ARTERIOVENOUS FISTULA Left 02/09/2014   Procedure: REVISON OF LEFT ARTERIOVENOUS FISTULA - RESECTION OF RENDUNDANT VEIN;  Surgeon: Christopher S Dickson, MD;  Location: [facility] OR;  Service: Vascular;  Laterality: Left;  . SBO with lysis adhesions    . SHUNTOGRAM Left 04/19/2014   Procedure: FISTULOGRAM;  Surgeon: Christopher S Dickson, MD;  Location: [facility] CATH LAB;  Service: Cardiovascular;  Laterality: Left;  . UNILATERAL UPPER EXTREMEITY ANGIOGRAM N/A 07/12/2014   Procedure: UNILATERAL UPPER EXTREMEITY ANGIOGRAM;  Surgeon: Christopher S Dickson, MD;  [facility]: [facility] CATH LAB;  Service: Cardiovascular;  Laterality: N/A;   Family History  Problem Relation Age of Onset  . Aneurysm Mother        brain  . Heart disease Father   . Stroke Father   . Hypertension Father   . Diabetes Father   . Dementia Neg Hx   . Colon cancer Neg Hx   . Esophageal cancer Neg Hx   . Pancreatic cancer Neg Hx   . Prostate cancer Neg Hx   . Rectal cancer Neg Hx   . Stomach cancer Neg Hx    Social History:  reports that he quit smoking about 26 years ago. He has never used smokeless tobacco. He reports that he does not drink alcohol and does not use drugs. Allergies  Allergen Reactions  . Ambien [Zolpidem Tartrate] Other (See Comments)    Hallucinations and "felt crazy"   . Penicillins Rash and Hives    Has patient had a PCN reaction causing immediate rash, facial/tongue/throat swelling, SOB or lightheadedness with hypotension: Yes Has patient had a PCN reaction causing severe rash involving mucus membranes or skin necrosis: Yes Has patient had a PCN reaction that required hospitalization: No Has patient had a PCN reaction  occurring within the last 10 years: No If all of the above answers are "NO", then may proceed with Cephalosporin use.  Other reaction(s): HIVES   Prior to Admission medications   Medication Sig Start Date End Date Taking? Authorizing Provider  acetaminophen (TYLENOL) 325 MG tablet Take 1-2 tablets (325-650 mg total) by mouth every 4 (four) hours as needed for mild pain. Patient taking differently: Take 650 mg by mouth in the morning and at bedtime. 08/01/18  Yes Love, Pamela S, PA-C  albuterol (VENTOLIN HFA) 108 (90 Base) MCG/ACT inhaler Inhale 1 puff into the lungs daily as needed for wheezing or shortness of breath.   Yes [provider]  allopurinol (ZYLOPRIM) 100 MG tablet Take 100 mg by mouth daily.   Yes [provider]  camphor-menthol (SARNA) lotion Apply 1 application topically 4 (four) times daily as needed for itching.    Yes [provider]  Carboxymethylcellulose Sodium (THERATEARS)   0.25 % SOLN Place 1 drop into both eyes as needed (for dryness).   Yes [provider]  cetirizine (ZYRTEC) 10 MG chewable tablet Chew 10 mg by mouth every Monday, Wednesday, and Friday.   Yes [provider]  clobetasol (TEMOVATE) 0.05 % external solution Apply 1 application topically 2 (two) times daily as needed (scalp irritation.).  04/19/15  Yes [provider]  denosumab (PROLIA) 60 MG/ML SOSY injection Inject 60 mg into the skin every 6 (six) months.   Yes [provider]  donepezil (ARICEPT) 10 MG tablet Take 1 tablet (10 mg total) by mouth at bedtime. 07/28/20  Yes Ahern, Antonia B, MD  fluticasone (FLONASE) 50 MCG/ACT nasal spray Place 1 spray into both nostrils daily. Patient taking differently: Place 1 spray into both nostrils daily as needed for allergies. 10/08/14  Yes Mikhail, Maryann, DO  hydrocortisone 2.5 % cream Apply 1 application topically 3 (three) times daily as needed (skin irritation (legs & arms)).  01/13/19  Yes [provider]  insulin aspart (NOVOLOG FLEXPEN) 100 UNIT/ML FlexPen INJECT 6-8 UNITS UNDER THE SKIN THREE TIMES DAILY BEFORE MEALS. DX:E11.65 Patient taking differently: Inject 6-8 Units into the skin See admin instructions. Inject 6-8 units into the skin three times a day with meals, PER SLIDING SCALE 11/25/18  Yes Kumar, Ajay, MD  ketoconazole (NIZORAL) 2 % shampoo Apply 1 application topically 2 (two) times a week. Patient taking differently: Apply 1 application topically daily as needed for irritation (SHAMPOO). 03/31/15  Yes Baxley, Mary J, MD  levothyroxine (SYNTHROID, LEVOTHROID) 50 MCG tablet Take 1 tablet (50 mcg total) by mouth daily. Patient taking differently: Take 50 mcg by mouth daily before breakfast. 08/06/14  Yes Baxley, Mary J, MD  lidocaine (LIDODERM) 5 % Place 1 patch onto the skin daily as needed (back pain).  11/12/19  Yes [provider]  memantine (NAMENDA) 10 MG tablet Take 1 tablet (10 mg total) by mouth 2 (two) times daily. 07/28/20  Yes Ahern, Antonia B, MD  midodrine (PROAMATINE) 10 MG tablet Take one pill prior to hemodialysis on MWF Patient taking differently: Take 10 mg by mouth See admin instructions. Take 10 mg by mouth prior to dialysis on Mon/Wed/Fri 08/13/18  Yes Love, Pamela S, PA-C  Nutritional Supplements (FEEDING SUPPLEMENT, NEPRO CARB STEADY,) LIQD Take 237 mLs by mouth in the morning and at bedtime.    Yes [provider]  omeprazole (PRILOSEC) 40 MG capsule Take 1 capsule (40 mg total) by mouth daily. Patient taking differently: Take 40 mg by mouth daily before breakfast. 03/13/18  Yes Esterwood, Amy S, PA-C  sevelamer carbonate (RENVELA) 800 MG tablet Take 1,600-2,400 mg by mouth See admin instructions. Take 2,400 mg by mouth three times a day with meals and 1,600 mg with snacks on Sun/Tues/Thurs/Sat and 2,400 mg with breakfast and dinner and nothing with snacks on Mon/Wed/Fri   Yes [provider]  simvastatin (ZOCOR) 20 MG tablet Take  20 mg by mouth at bedtime.   Yes [provider]  TRADJENTA 5 MG TABS tablet TAKE 1 TABLET DAILY Patient taking differently: Take 5 mg by mouth daily. 03/01/20  Yes Kumar, Ajay, MD  Blood Glucose Monitoring Suppl (FREESTYLE FREEDOM LITE) w/Device KIT Use to check blood sugar 2 times per day dx code E11.65 10/21/18   Kumar, Ajay, MD  dicyclomine (BENTYL) 20 MG tablet Take 1 tablet (20 mg total) by mouth every 8 (eight) hours. Patient not taking: Reported on 11/07/2020 08/11/20     Cirigliano, Vito V, DO  FLUOCINOLONE ACETONIDE SCALP 0.01 % OIL Apply 1 application topically daily as needed (scalp irritation.).  04/28/15   [provider]  gabapentin (NEURONTIN) 100 MG capsule Take 100 mg by mouth at bedtime.    [provider]  glucose blood (FREESTYLE LITE) test strip USE THREE TIMES DAILY 01/25/20   Elayne Snare, MD  Insulin Pen Needle (PEN NEEDLES) 32G X 4 MM MISC 1 each by Does not apply route 3 (three) times daily. USE PEN NEEDLES TO INJECT INSULIN THREE TIMES DAILY. 11/25/18   Elayne Snare, MD  Lancets (FREESTYLE) lancets Use as instructed to check blood sugar 2 times per day dx code E11.65 02/22/16   Elayne Snare, MD   Current Facility-Administered Medications  Medication Dose Route Frequency Provider Last Rate Last Admin  . acetaminophen (TYLENOL) tablet 325-650 mg  325-650 mg Oral Q4H PRN Wynetta Fines T, MD      . albuterol (VENTOLIN HFA) 108 (90 Base) MCG/ACT inhaler 1 puff  1 puff Inhalation Daily PRN Wynetta Fines T, MD      . allopurinol (ZYLOPRIM) tablet 100 mg  100 mg Oral Daily Wynetta Fines T, MD      . Carboxymethylcellulose Sodium 0.25 % SOLN 1 drop  1 drop Both Eyes PRN Wynetta Fines T, MD      . clobetasol (TEMOVATE) 0.05 % external solution 1 application  1 application Topical BID PRN Wynetta Fines T, MD      . docusate sodium (COLACE) capsule 100 mg  100 mg Oral BID Wynetta Fines T, MD      . donepezil (ARICEPT) tablet 10 mg  10 mg Oral QHS Wynetta Fines T, MD      . feeding  supplement (NEPRO CARB STEADY) liquid 237 mL  237 mL Oral TID WC Lequita Halt, MD      . Fluocinolone Acetonide Scalp 7.84 % OIL 1 application  1 application Topical Daily PRN Wynetta Fines T, MD      . fluticasone (FLONASE) 50 MCG/ACT nasal spray 1 spray  1 spray Each Nare Daily Wynetta Fines T, MD      . gabapentin (NEURONTIN) capsule 100 mg  100 mg Oral QHS Wynetta Fines T, MD      . HYDROcodone-acetaminophen (NORCO/VICODIN) 5-325 MG per tablet 1-2 tablet  1-2 tablet Oral Q6H PRN Wynetta Fines T, MD      . hydrocortisone 2.5 % cream 1 application  1 application Topical TID PRN Wynetta Fines T, MD      . HYDROmorphone (DILAUDID) injection 0.5 mg  0.5 mg Intravenous Q2H PRN Wynetta Fines T, MD      . insulin aspart (novoLOG) injection 0-6 Units  0-6 Units Subcutaneous TID WC Wynetta Fines T, MD      . Derrill Memo ON 11/08/2020] levothyroxine (SYNTHROID) tablet 50 mcg  50 mcg Oral QAC breakfast Wynetta Fines T, MD      . lidocaine (LIDODERM) 5 % 1 patch  1 patch Transdermal Daily PRN Wynetta Fines T, MD      . linagliptin (TRADJENTA) tablet 5 mg  5 mg Oral Daily Wynetta Fines T, MD      . loratadine (CLARITIN) tablet 10 mg  10 mg Oral Daily Wynetta Fines T, MD      . memantine [facility]) tablet 10 mg  10 mg Oral BID Wynetta Fines T, MD      . midodrine (PROAMATINE) tablet 10 mg  10 mg Oral See admin instructions Lequita Halt, MD      .  pantoprazole (PROTONIX) EC tablet 40 mg  40 mg Oral Daily Wynetta Fines T, MD      . sevelamer carbonate (RENVELA) tablet 1,600-2,400 mg  1,600-2,400 mg Oral See admin instructions Wynetta Fines T, MD      . simvastatin (ZOCOR) tablet 20 mg  20 mg Oral QHS Lequita Halt, MD       Current Outpatient Medications  Medication Sig Dispense Refill  . acetaminophen (TYLENOL) 325 MG tablet Take 1-2 tablets (325-650 mg total) by mouth every 4 (four) hours as needed for mild pain. (Patient taking differently: Take 650 mg by mouth in the morning and at bedtime.)    . albuterol (VENTOLIN HFA) 108 (90 Base)  MCG/ACT inhaler Inhale 1 puff into the lungs daily as needed for wheezing or shortness of breath.    . allopurinol (ZYLOPRIM) 100 MG tablet Take 100 mg by mouth daily.    . camphor-menthol (SARNA) lotion Apply 1 application topically 4 (four) times daily as needed for itching.     . Carboxymethylcellulose Sodium (THERATEARS) 0.25 % SOLN Place 1 drop into both eyes as needed (for dryness).    . cetirizine (ZYRTEC) 10 MG chewable tablet Chew 10 mg by mouth every Monday, Wednesday, and Friday.    . clobetasol (TEMOVATE) 0.05 % external solution Apply 1 application topically 2 (two) times daily as needed (scalp irritation.).   0  . denosumab (PROLIA) 60 MG/ML SOSY injection Inject 60 mg into the skin every 6 (six) months.    . donepezil (ARICEPT) 10 MG tablet Take 1 tablet (10 mg total) by mouth at bedtime. 90 tablet 3  . fluticasone (FLONASE) 50 MCG/ACT nasal spray Place 1 spray into both nostrils daily. (Patient taking differently: Place 1 spray into both nostrils daily as needed for allergies.) 16 g 2  . hydrocortisone 2.5 % cream Apply 1 application topically 3 (three) times daily as needed (skin irritation (legs & arms)).     Marland Kitchen insulin aspart (NOVOLOG FLEXPEN) 100 UNIT/ML FlexPen INJECT 6-8 UNITS UNDER THE SKIN THREE TIMES DAILY BEFORE MEALS. DX:E11.65 (Patient taking differently: Inject 6-8 Units into the skin See admin instructions. Inject 6-8 units into the skin three times a day with meals, PER SLIDING SCALE) 15 mL 12  . ketoconazole (NIZORAL) 2 % shampoo Apply 1 application topically 2 (two) times a week. (Patient taking differently: Apply 1 application topically daily as needed for irritation (SHAMPOO).) 120 mL 0  . levothyroxine (SYNTHROID, LEVOTHROID) 50 MCG tablet Take 1 tablet (50 mcg total) by mouth daily. (Patient taking differently: Take 50 mcg by mouth daily before breakfast.) 90 tablet 1  . lidocaine (LIDODERM) 5 % Place 1 patch onto the skin daily as needed (back pain).     . memantine  (NAMENDA) 10 MG tablet Take 1 tablet (10 mg total) by mouth 2 (two) times daily. 180 tablet 3  . midodrine (PROAMATINE) 10 MG tablet Take one pill prior to hemodialysis on MWF (Patient taking differently: Take 10 mg by mouth See admin instructions. Take 10 mg by mouth prior to dialysis on Mon/Wed/Fri)    . Nutritional Supplements (FEEDING SUPPLEMENT, NEPRO CARB STEADY,) LIQD Take 237 mLs by mouth in the morning and at bedtime.     Marland Kitchen omeprazole (PRILOSEC) 40 MG capsule Take 1 capsule (40 mg total) by mouth daily. (Patient taking differently: Take 40 mg by mouth daily before breakfast.) 30 capsule 0  . sevelamer carbonate (RENVELA) 800 MG tablet Take 1,600-2,400 mg by mouth See admin instructions. Take  2,400 mg by mouth three times a day with meals and 1,600 mg with snacks on Sun/Tues/Thurs/Sat and 2,400 mg with breakfast and dinner and nothing with snacks on Mon/Wed/Fri    . simvastatin (ZOCOR) 20 MG tablet Take 20 mg by mouth at bedtime.    . TRADJENTA 5 MG TABS tablet TAKE 1 TABLET DAILY (Patient taking differently: Take 5 mg by mouth daily.) 90 tablet 3  . Blood Glucose Monitoring Suppl (FREESTYLE FREEDOM LITE) w/Device KIT Use to check blood sugar 2 times per day dx code E11.65 1 each 0  . dicyclomine (BENTYL) 20 MG tablet Take 1 tablet (20 mg total) by mouth every 8 (eight) hours. (Patient not taking: Reported on 11/07/2020) 30 tablet 3  . FLUOCINOLONE ACETONIDE SCALP 0.01 % OIL Apply 1 application topically daily as needed (scalp irritation.).   0  . gabapentin (NEURONTIN) 100 MG capsule Take 100 mg by mouth at bedtime.    Marland Kitchen glucose blood (FREESTYLE LITE) test strip USE THREE TIMES DAILY 300 strip 2  . Insulin Pen Needle (PEN NEEDLES) 32G X 4 MM MISC 1 each by Does not apply route 3 (three) times daily. USE PEN NEEDLES TO INJECT INSULIN THREE TIMES DAILY. 100 each 12  . Lancets (FREESTYLE) lancets Use as instructed to check blood sugar 2 times per day dx code E11.65 100 each 3   Labs: Basic  Metabolic Panel: Recent Labs  Lab 11/07/20 1200  NA 140  K 4.2  CL 101  CO2 26  GLUCOSE 134*  BUN 49*  CREATININE 10.06*  CALCIUM 9.8   Liver Function Tests: Recent Labs  Lab 11/07/20 1200  AST 21  ALT 12  ALKPHOS 83  BILITOT 0.6  PROT 6.0*  ALBUMIN 2.9*   CBC: Recent Labs  Lab 11/07/20 1200  WBC 10.0  NEUTROABS 6.9  HGB 10.3*  HCT 30.8*  MCV 105.5*  PLT 163   Studies/Results: DG Chest 1 View  Result Date: 11/07/2020 CLINICAL DATA:  Pain following fall EXAM: CHEST  1 VIEW COMPARISON:  May 26, 2020 FINDINGS: There is slight left base atelectasis. Lungs elsewhere clear. Heart size and pulmonary vascularity are within normal limits. There is aortic atherosclerosis. No evident adenopathy. No bone lesions. IMPRESSION: Slight left base atelectasis. Lungs otherwise clear. Heart size normal. Aortic Atherosclerosis (ICD10-I70.0). Electronically Signed   By: Lowella Grip III M.D.   On: 11/07/2020 11:51   CT Head Wo Contrast  Result Date: 11/07/2020 CLINICAL DATA:  Fall. EXAM: CT HEAD WITHOUT CONTRAST CT CERVICAL SPINE WITHOUT CONTRAST TECHNIQUE: Multidetector CT imaging of the head and cervical spine was performed following the standard protocol without intravenous contrast. Multiplanar CT image reconstructions of the cervical spine were also generated. COMPARISON:  May 26, 2020. FINDINGS: CT HEAD FINDINGS Brain: Stable bilateral frontal chronic hygromas are noted, left greater than right. Mild diffuse cortical atrophy is noted. Ventricular size is within normal limits. No evidence of acute hemorrhage, infarction or intracranial mass lesion is noted. Vascular: No hyperdense vessel or unexpected calcification. Skull: Status post bilateral frontal craniotomies. No acute osseous abnormality is noted. Sinuses/Orbits: No acute finding. Other: None. CT CERVICAL SPINE FINDINGS Alignment: Normal. Skull base and vertebrae: Old C7 fracture is noted. No acute fracture is noted.  Soft tissues and spinal canal: No prevertebral fluid or swelling. No visible canal hematoma. Disc levels: Moderate degenerative disc disease is noted at C3-4, C4-5, C5-6, C6-7 and C7-T1. Upper chest: Negative. Other: None. IMPRESSION: 1. Stable bilateral frontal chronic hygromas are noted, left  greater than right. Mild diffuse cortical atrophy. No acute intracranial abnormality seen. 2. Multilevel degenerative disc disease. No acute abnormality seen in the cervical spine. Electronically Signed   By: James  Green Jr M.D.   On: 11/07/2020 12:00   CT Cervical Spine Wo Contrast  Result Date: 11/07/2020 CLINICAL DATA:  Fall. EXAM: CT HEAD WITHOUT CONTRAST CT CERVICAL SPINE WITHOUT CONTRAST TECHNIQUE: Multidetector CT imaging of the head and cervical spine was performed following the standard protocol without intravenous contrast. Multiplanar CT image reconstructions of the cervical spine were also generated. COMPARISON:  May 26, 2020. FINDINGS: CT HEAD FINDINGS Brain: Stable bilateral frontal chronic hygromas are noted, left greater than right. Mild diffuse cortical atrophy is noted. Ventricular size is within normal limits. No evidence of acute hemorrhage, infarction or intracranial mass lesion is noted. Vascular: No hyperdense vessel or unexpected calcification. Skull: Status post bilateral frontal craniotomies. No acute osseous abnormality is noted. Sinuses/Orbits: No acute finding. Other: None. CT CERVICAL SPINE FINDINGS Alignment: Normal. Skull base and vertebrae: Old C7 fracture is noted. No acute fracture is noted. Soft tissues and spinal canal: No prevertebral fluid or swelling. No visible canal hematoma. Disc levels: Moderate degenerative disc disease is noted at C3-4, C4-5, C5-6, C6-7 and C7-T1. Upper chest: Negative. Other: None. IMPRESSION: 1. Stable bilateral frontal chronic hygromas are noted, left greater than right. Mild diffuse cortical atrophy. No acute intracranial abnormality seen. 2.  Multilevel degenerative disc disease. No acute abnormality seen in the cervical spine. Electronically Signed   By: James  Green Jr M.D.   On: 11/07/2020 12:00   DG Hip Unilat W or Wo Pelvis 2-3 Views Right  Result Date: 11/07/2020 CLINICAL DATA:  Pain following fall EXAM: DG HIP (WITH OR WITHOUT PELVIS) 2-3V RIGHT COMPARISON:  None. FINDINGS: Frontal pelvis as well as frontal and lateral right hip images were obtained. There is an intertrochanteric femur fracture on the right with slight impaction at the fracture site. No other fracture. No dislocation. There is severe narrowing of the right hip joint with moderate narrowing of the left hip joint. No erosion. There is extensive distal aortic and iliac artery atherosclerotic calcification. There is also common femoral and superficial femoral artery atherosclerotic calcification bilaterally. There are surgical clips in each inguinal region. IMPRESSION: Intertrochanteric femur fracture on the right with mild impaction at the fracture site. No other acute fracture. No dislocation. 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 There is multifocal atherosclerotic arterial vascular calcification. Electronically Signed   By: Mekhai  Woodruff III M.D.   On: 11/07/2020 11:47    ROS: All others negative except those listed in HPI.  Physical Exam: Vitals:   11/07/20 1037 11/07/20 1247 11/07/20 1445  BP: (!) 193/45 (!) 171/44 (!) 156/59  Pulse: 76 74 73  Resp: 12 15 14  Temp: 98.2 F (36.8 C)    TempSrc: Oral    SpO2: 100% 100% 99%     General: WDWN male in NAD Head: NCAT sclera not icteric MMM Neck: Supple. No lymphadenopathy Lungs: CTA bilaterally. No wheeze, rales or rhonchi. Breathing is unlabored. Heart: RRR. No murmur, rubs or gallops.  Abdomen: soft, nontender, +BS, +midline scar  Lower extremities:no edema, ischemic changes, or open wounds, +pain R thigh Neuro: A, oriented to person/place. Moves  all extremities spontaneously. Psych:  Responds to questions appropriately with a normal affect. Dialysis Access: RU AVF +b/t  Dialysis Orders:  MWF - [place]  4hrs, BFR 400, DFR   500,  EDW 74kg, 2K/ 2Ca  Access: RU AVF  Heparin none Mircera 75 mcg q2wks - last 100mcg on 10/28/20  Assessment/Plan: 1.  R femur Frx - plan for surgery tomorrow.  2.  ESRD -  On HD MWF.  Last HD Friday.  Labs close to baseline, K 4.2.  No signs of uremia, volume overload or respiratory distress.  Plan for HD tomorrow due to high patient census/staffing issues.  3.  Hypertension/volume  - Blood pressure mildly elevated. Continue home meds.  Does not appear volume overloaded.  UF as tolerated.  4.  Anemia of CKD - Hgb 10.3. ESA due Friday, will order.  Expect drop post surgery.  Follow trends.   5.  Secondary Hyperparathyroidism -  Corrected Ca high.  Not on VDRA, use low Ca bath. Will check phos. Continue Renvela.  6.  Nutrition - Renal diet with fluid restrictions.  Protein supplements. Renal vitamin.  7. DM - per primary 8. Hypothyroidism - on synthroid.  9. Gout 10. HLD   , PA-C [facility] 11/07/2020, 3:03 PM   

## 2020-11-07 NOTE — ED Notes (Signed)
Pt hard stick. Consulted IV team

## 2020-11-07 NOTE — ED Notes (Signed)
Teletracking assigned @ 1829-per Kathlee Nations, RN ordered by Levada Dy

## 2020-11-07 NOTE — ED Notes (Signed)
Pt wife updated on plan of care and pt is know in holding room

## 2020-11-07 NOTE — Consult Note (Addendum)
Cardiology Consultation:   Patient ID: [name] MRN: [id number]; DOB: [birth year]  Admit date: 11/07/2020 Date of Consult: 11/07/2020  PCP:  Elby Showers, MD   [place]  Cardiologist:  No primary care provider on file. New Dr. Gwenlyn Found Advanced Practice Provider:  No care team member to display Electrophysiologist:  None        Patient Profile:   [name] is a 80 y.o. 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 Roosevelt Locks.  History of Present Illness:   [name] per record has coronary calcifications on CTA of chest, his health care is usually the [place].  History as above.  Pt fell 2 nights ago - no loss of consciousness. He was unable to get himself up.   No chest pain. He is unsure now of what happened, may have just been weak and fell.  He remembers fall and tells me it happened twice     His wife did report for last year he has had increased DOE.  This has increased over last year.  His risk factors for CAD include DM, age, HLD, and FH of CAD.   He is on midodrine on dialysis days.   He has been found to have Rt intertrochanteric fracture and will need surgery tomorrow.     Echo was done.  Renal is following for ESRD.     CXR with slight left base atelectasis, lungs otherwise clear , no cardiomegaly.  + aortic atherosclerosis.       CT head and cervical spine no acute process.  Stable bilateral frontal chronic hygromas are noted, left greater than right. Mild diffuse cortical atrophy. No acute intracranial abnormality seen.  EKG:  The EKG was personally reviewed and demonstrates:  SR at 42 with 1st degree AV block PR 256 and LAD. No acute ST changes Telemetry:  Telemetry was personally reviewed and demonstrates:  SR 1st degree AV block  Na 140, K+  4.2, BUN 49, Cr 10.06 AST 21 ALT 12  WBC 10, Hgb 10.3 plts 163  Neg COVID  Echo pending BP 156/59 P 70 R 16  BP on arrival 193/45   Pt has never had chest pain or cardiac cath. He has had stress test and was told he was good.  No awareness of CAD per pt or wife.     Past Medical History:  Diagnosis Date   Allergy    Anemia    Arthritis    Cataract    bil cateracts removed   Coronary artery disease    Dementia arising in the senium and presenium ([place])    Diabetes mellitus    Type 2   Diverticulitis    ED (erectile dysfunction)    Elevated homocysteine    ESRD (end stage renal disease) on dialysis ([place]) 03/2015   GERD (gastroesophageal reflux disease)    pepto    Gout    Hiatal hernia    Hyperlipidemia    Hypertension    Hypothyroidism    Pneumonia    PVD (peripheral vascular disease) ([place])    has plastic aorta   Renal insufficiency    Seasonal allergies    Sleep apnea    does not wear c-pap    Past Surgical History:  Procedure  Laterality Date   aortobifemoral bypass     AV FISTULA PLACEMENT Left 12/01/2013   Procedure: ARTERIOVENOUS (AV) FISTULA CREATION- LEFT BRACHIOCEPHALIC;  Surgeon: Angelia Mould, MD;  Location: [place];  Service: Vascular;  Laterality: Left;   [place] Right 07/27/2014   Procedure: [place];  Surgeon: Angelia Mould, MD;  Location: [place];  Service: Vascular;  Laterality: Right;   BRAIN SURGERY  07/22/2018   BREAST SURGERY     left - granulomatous mastitis   BURR HOLE Bilateral 07/22/2018   Procedure: BILATERAL BURR HOLES;  Surgeon: Kristeen Miss, MD;  Location: [place];  Service: Neurosurgery;  Laterality: Bilateral;   COLONOSCOPY     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 ([place] [facility])  1992   ENTEROSCOPY N/A 06/03/2018   Procedure: ENTEROSCOPY;  Surgeon: Lavena Bullion, DO;  Location: [facility] ENDOSCOPY;  Service: Gastroenterology;  Laterality: N/A;   ENTEROSCOPY N/A 11/20/2018   Procedure: ENTEROSCOPY;   Surgeon: Rush Landmark Telford Nab., MD;  Location: [place];  Service: Gastroenterology;  Laterality: N/A;   ENTEROSCOPY N/A 12/03/2019   Procedure: ENTEROSCOPY;  Surgeon: Lavena Bullion, DO;  Location: [facility] ENDOSCOPY;  Service: Gastroenterology;  Laterality: N/A;  push enteroscopy   EYE SURGERY Bilateral    cataracts   HEMODIALYSIS INPATIENT  01/17/2018       HOT HEMOSTASIS N/A 06/03/2018   Procedure: HOT HEMOSTASIS (ARGON PLASMA COAGULATION/BICAP);  Surgeon: Lavena Bullion, DO;  Location: [facility] ENDOSCOPY;  Service: Gastroenterology;  Laterality: N/A;   HOT HEMOSTASIS N/A 11/20/2018   Procedure: HOT HEMOSTASIS (ARGON PLASMA COAGULATION/BICAP);  Surgeon: Irving Copas., MD;  Location: [place];  Service: Gastroenterology;  Laterality: N/A;   HOT HEMOSTASIS N/A 12/03/2019   Procedure: HOT HEMOSTASIS (ARGON PLASMA COAGULATION/BICAP);  Surgeon: Lavena Bullion, DO;  Location: [facility] ENDOS[facility];  Service: Gastroenterology;  Laterality: N/A;   REVISION OF ARTERIOVENOUS GORETEX GRAFT Right 09/01/2019   Procedure: REVISION OF ARTERIOVENOUS FISTULA RIGHT ARM;  Surgeon: Serafina Mitchell, MD;  Location: [place];  Service: Vascular;  Laterality: Right;   REVISON OF ARTERIOVENOUS FISTULA Left 02/09/2014   Procedure: REVISON OF LEFT ARTERIOVENOUS FISTULA - RESECTION OF RENDUNDANT VEIN;  Surgeon: Angelia Mould, MD;  Location: [place];  Service: Vascular;  Laterality: Left;   SBO with lysis adhesions     SHUNTOGRAM Left 04/19/2014   Procedure: FISTULOGRAM;  Surgeon: Angelia Mould, MD;  Location: [facility] CATH LAB;  Service: Cardiovascular;  Laterality: Left;   UNILATERAL UPPER EXTREMEITY ANGIOGRAM N/A 07/12/2014   Procedure: UNILATERAL UPPER Anselmo Rod;  Surgeon: Angelia Mould, MD;  Location[facility];  Service: Cardiovascular;  Laterality: N/A;     Home Medications:  Prior to Admission medications   Medication Sig Start Date End Date Taking? Authorizing Provider   acetaminophen (TYLENOL) 325 MG tablet Take 1-2 tablets (325-650 mg total) by mouth every 4 (four) hours as needed for mild pain. Patient taking differently: Take 650 mg by mouth in the morning and at bedtime. 08/01/18  Yes Love, Ivan Anchors, PA-C  albuterol (VENTOLIN HFA) 108 (90 Base) MCG/ACT inhaler Inhale 1 puff into the lungs daily as needed for wheezing or shortness of breath.   Yes [provider]  allopurinol (ZYLOPRIM) 100 MG tablet Take 100 mg by mouth daily.   Yes [provider]  camphor-menthol Timoteo Ace) lotion Apply 1 application topically 4 (four) times daily as needed for itching.    Yes [provider]  Carboxymethylcellulose Sodium (THERATEARS) 0.25 %  SOLN Place 1 drop into both eyes as needed (for dryness).   Yes [provider]  cetirizine (ZYRTEC) 10 MG chewable tablet Chew 10 mg by mouth every Monday, Wednesday, and Friday.   Yes [provider]  clobetasol (TEMOVATE) 0.05 % external solution Apply 1 application topically 2 (two) times daily as needed (scalp irritation.).  04/19/15  Yes [provider]  denosumab (PROLIA) 60 MG/ML SOSY injection Inject 60 mg into the skin every 6 (six) months.   Yes [provider]  donepezil (ARICEPT) 10 MG tablet Take 1 tablet (10 mg total) by mouth at bedtime. 07/28/20  Yes Melvenia Beam, MD  fluticasone (FLONASE) 50 MCG/ACT nasal spray Place 1 spray into both nostrils daily. Patient taking differently: Place 1 spray into both nostrils daily as needed for allergies. 10/08/14  Yes Mikhail, Velta Addison, DO  hydrocortisone 2.5 % cream Apply 1 application topically 3 (three) times daily as needed (skin irritation (legs & arms)).  01/13/19  Yes [provider]  insulin aspart (NOVOLOG FLEXPEN) 100 UNIT/ML FlexPen INJECT 6-8 UNITS UNDER THE SKIN THREE TIMES DAILY BEFORE MEALS. DX:E11.65 Patient taking differently: Inject 6-8 Units into the skin See admin instructions. Inject 6-8 units into  the skin three times a day with meals, PER SLIDING SCALE 11/25/18  Yes Elayne Snare, MD  ketoconazole (NIZORAL) 2 % shampoo Apply 1 application topically 2 (two) times a week. Patient taking differently: Apply 1 application topically daily as needed for irritation (SHAMPOO). 03/31/15  Yes Baxley, Cresenciano Lick, MD  levothyroxine (SYNTHROID, LEVOTHROID) 50 MCG tablet Take 1 tablet (50 mcg total) by mouth daily. Patient taking differently: Take 50 mcg by mouth daily before breakfast. 08/06/14  Yes Baxley, Cresenciano Lick, MD  lidocaine (LIDODERM) 5 % Place 1 patch onto the skin daily as needed (back pain).  11/12/19  Yes [provider]  memantine (NAMENDA) 10 MG tablet Take 1 tablet (10 mg total) by mouth 2 (two) times daily. 07/28/20  Yes Melvenia Beam, MD  midodrine (PROAMATINE) 10 MG tablet Take one pill prior to hemodialysis on MWF Patient taking differently: Take 10 mg by mouth See admin instructions. Take 10 mg by mouth prior to dialysis on Mon/Wed/Fri 08/13/18  Yes Love, Ivan Anchors, PA-C  Nutritional Supplements (FEEDING SUPPLEMENT, NEPRO CARB STEADY,) LIQD Take 237 mLs by mouth in the morning and at bedtime.    Yes [provider]  omeprazole (PRILOSEC) 40 MG capsule Take 1 capsule (40 mg total) by mouth daily. Patient taking differently: Take 40 mg by mouth daily before breakfast. 03/13/18  Yes Esterwood, Amy S, PA-C  sevelamer carbonate (RENVELA) 800 MG tablet Take 1,600-2,400 mg by mouth See admin instructions. Take 2,400 mg by mouth three times a day with meals and 1,600 mg with snacks on Sun/Tues/Thurs/Sat and 2,400 mg with breakfast and dinner and nothing with snacks on Mon/Wed/Fri   Yes [provider]  simvastatin (ZOCOR) 20 MG tablet Take 20 mg by mouth at bedtime.   Yes [provider]  TRADJENTA 5 MG TABS tablet TAKE 1 TABLET DAILY Patient taking differently: Take 5 mg by mouth daily. 03/01/20  Yes Elayne Snare, MD  Blood Glucose Monitoring Suppl (FREESTYLE FREEDOM LITE)  w/Device KIT Use to check blood sugar 2 times per day dx code E11.65 10/21/18   Elayne Snare, MD  dicyclomine (BENTYL) 20 MG tablet Take 1 tablet (20 mg total) by mouth every 8 (eight) hours. Patient not taking: Reported on 11/07/2020 08/11/20   Gerrit Heck  V, DO  FLUOCINOLONE ACETONIDE SCALP 0.01 % OIL Apply 1 application topically daily as needed (scalp irritation.).  04/28/15   [provider]  gabapentin (NEURONTIN) 100 MG capsule Take 100 mg by mouth at bedtime.    [provider]  glucose blood (FREESTYLE LITE) test strip USE THREE TIMES DAILY 01/25/20   Elayne Snare, MD  Insulin Pen Needle (PEN NEEDLES) 32G X 4 MM MISC 1 each by Does not apply route 3 (three) times daily. USE PEN NEEDLES TO INJECT INSULIN THREE TIMES DAILY. 11/25/18   Elayne Snare, MD  Lancets (FREESTYLE) lancets Use as instructed to check blood sugar 2 times per day dx code E11.65 02/22/16   Elayne Snare, MD    Inpatient Medications: Scheduled Meds:  [START ON 11/08/2020] allopurinol  100 mg Oral Daily   [START ON 11/08/2020] Chlorhexidine Gluconate Cloth  6 each Topical Q0600   docusate sodium  100 mg Oral BID   donepezil  10 mg Oral QHS   feeding supplement (NEPRO CARB STEADY)  237 mL Oral TID WC   [START ON 11/08/2020] fluticasone  1 spray Each Nare Daily   gabapentin  100 mg Oral QHS   insulin aspart  0-6 Units Subcutaneous TID WC   [START ON 11/08/2020] levothyroxine  50 mcg Oral QAC breakfast   [START ON 11/08/2020] linagliptin  5 mg Oral Daily   [START ON 11/08/2020] loratadine  10 mg Oral Daily   memantine  10 mg Oral BID   midodrine  10 mg Oral Q M,W,F-HD   [START ON 11/08/2020] pantoprazole  40 mg Oral Daily   sevelamer carbonate  1,600-2,400 mg Oral See admin instructions   simvastatin  20 mg Oral QHS   Continuous Infusions:   PRN Meds: acetaminophen, albuterol, Carboxymethylcellulose Sodium, clobetasol, Fluocinolone Acetonide Scalp, hydrALAZINE, HYDROcodone-acetaminophen, hydrocortisone,  HYDROmorphone (DILAUDID) injection, lidocaine  Allergies:    Allergies  Allergen Reactions   Ambien [Zolpidem Tartrate] Other (See Comments)    Hallucinations and "felt crazy"    Penicillins Rash and Hives    Has patient had a PCN reaction causing immediate rash, facial/tongue/throat swelling, SOB or lightheadedness with hypotension: Yes Has patient had a PCN reaction causing severe rash involving mucus membranes or skin necrosis: Yes Has patient had a PCN reaction that required hospitalization: No Has patient had a PCN reaction occurring within the last 10 years: No If all of the above answers are "NO", then may proceed with Cephalosporin use.  Other reaction(s): HIVES    Social History:   Social History   Socioeconomic History   Marital status: Married    Spouse name: [name] Mood   Number of children: 1   Years of education: 16   Highest education level: Not on file  Occupational History   Occupation: Retired  Tobacco Use   Smoking status: Former Smoker    Quit date: 09/24/1994    Years since quitting: 26.1   Smokeless tobacco: Never Used  Vaping Use   Vaping Use: Never used  Substance and Sexual Activity   Alcohol use: No    Alcohol/week: 0.0 standard drinks    Comment: Quit Oct. 1977 ("somewhat heavy")   Drug use: No   Sexual activity: Not on file  Other Topics Concern   Not on file  Social History Narrative   Lives at home with wife.   Caffeine use: Drinks no tea, "a little soda"   Drinks 1 cup coffee/week   Right handed   Social Determinants of Health   Financial  Resource Strain: Not on file  Food Insecurity: Not on file  Transportation Needs: Not on file  Physical Activity: Not on file  Stress: Not on file  Social Connections: Not on file  Intimate Partner Violence: Not on file    Family History:    Family History  Problem Relation Age of Onset   Aneurysm Mother        brain   Heart disease Father    Stroke Father    Hypertension Father    Diabetes  Father    Dementia Neg Hx    Colon cancer Neg Hx    Esophageal cancer Neg Hx    Pancreatic cancer Neg Hx    Prostate cancer Neg Hx    Rectal cancer Neg Hx    Stomach cancer Neg Hx      ROS:  Please see the history of present illness.  General:no colds or fevers, no weight changes Skin:no rashes or ulcers HEENT:no blurred vision, no congestion CV:see HPI PUL:see HPI GI:no diarrhea constipation or melena, no indigestion GU:no hematuria, no dysuria MS:no joint pain, no claudication Neuro:no syncope, no lightheadedness Endo:+ diabetes, + thyroid disease  All other ROS reviewed and negative.     Physical Exam/Data:   Vitals:   11/07/20 1037 11/07/20 1247 11/07/20 1445 11/07/20 1520  BP: (!) 193/45 (!) 171/44 (!) 156/59   Pulse: 76 74 73   Resp: _0 Temp: 98.2 F (36.8 C)   98.7 F (37.1 C)  TempSrc: Oral   Oral  SpO2: 100% 100% 99%    No intake or output data in the 24 hours ending 11/07/20 1616 Last 3 Weights 08/11/2020 08/04/2020 07/28/2020  Weight (lbs) 164 lb 4 oz 165 lb 9.6 oz 167 lb  Weight (kg) 74.503 kg 75.116 kg 75.751 kg     There is no height or weight on file to calculate BMI.  Exam per Dr. Gwenlyn Found General:  Well nourished, well developed, in no acute distress HEENT: normal Lymph: no adenopathy Neck: no JVD Endocrine:  No thryomegaly Vascular: No carotid bruits; pedal pulses do not palpate but feet equally warm  Cardiac:  normal S1, S2; RRR; no murmur gallup rub or click  Lungs:  clear to auscultation bilaterally, no wheezing, rhonchi or rales  Abd: soft, nontender, no hepatomegaly  Ext: no edema Musculoskeletal:  No deformities, BUE and BLE strength normal and equal Skin: warm and dry  Neuro:  Alert and oriented X 3 though poor memory, no focal abnormalities noted Psych:  Normal affect   Relevant CV Studies: Echo 04/29/18  Study Conclusions   - Left ventricle: The cavity size was normal. There was moderate    concentric hypertrophy. Systolic  function was normal. The    estimated ejection fraction was in the range of 55% to 60%. Wall    motion was normal; there were no regional wall motion    abnormalities. Doppler parameters are consistent with abnormal    left ventricular relaxation (grade 1 diastolic dysfunction).  - Aortic valve: Transvalvular velocity was within the normal range.    There was no stenosis. There was no significant regurgitation.  - Mitral valve: There was trivial regurgitation.  - Right ventricle: Systolic function was normal.  - Atrial septum: No defect or patent foramen ovale was identified.  - Tricuspid valve: There was mild regurgitation.  - Pulmonic valve: There was no significant regurgitation.   Impressions:   - Normal LV systolic function with impaired relaxation.   ECHO  pending   Laboratory Data:  High Sensitivity Troponin:  No results for input(s): TROPONINIHS in the last 720 hours.   Chemistry Recent Labs  Lab 11/07/20 1200  NA 140  K 4.2  CL 101  CO2 26  GLUCOSE 134*  BUN 49*  CREATININE 10.06*  CALCIUM 9.8  GFRNONAA 5*  ANIONGAP 13    Recent Labs  Lab 11/07/20 1200  PROT 6.0*  ALBUMIN 2.9*  AST 21  ALT 12  ALKPHOS 83  BILITOT 0.6   Hematology Recent Labs  Lab 11/07/20 1200  WBC 10.0  RBC 2.92*  HGB 10.3*  HCT 30.8*  MCV 105.5*  MCH 35.3*  MCHC 33.4  RDW 17.2*  PLT 163   BNPNo results for input(s): BNP, PROBNP in the last 168 hours.  DDimer No results for input(s): DDIMER in the last 168 hours.   Radiology/Studies:  DG Chest 1 View  Result Date: 11/07/2020 CLINICAL DATA:  Pain following fall EXAM: CHEST  1 VIEW COMPARISON:  May 26, 2020 FINDINGS: There is slight left base atelectasis. Lungs elsewhere clear. Heart size and pulmonary vascularity are within normal limits. There is aortic atherosclerosis. No evident adenopathy. No bone lesions. IMPRESSION: Slight left base atelectasis. Lungs otherwise clear. Heart size normal. Aortic Atherosclerosis  (ICD10-I70.0). Electronically Signed   By: Lowella Grip III M.D.   On: 11/07/2020 11:51   CT Head Wo Contrast  Result Date: 11/07/2020 CLINICAL DATA:  Fall. EXAM: CT HEAD WITHOUT CONTRAST CT CERVICAL SPINE WITHOUT CONTRAST TECHNIQUE: Multidetector CT imaging of the head and cervical spine was performed following the standard protocol without intravenous contrast. Multiplanar CT image reconstructions of the cervical spine were also generated. COMPARISON:  May 26, 2020. FINDINGS: CT HEAD FINDINGS Brain: Stable bilateral frontal chronic hygromas are noted, left greater than right. Mild diffuse cortical atrophy is noted. Ventricular size is within normal limits. No evidence of acute hemorrhage, infarction or intracranial mass lesion is noted. Vascular: No hyperdense vessel or unexpected calcification. Skull: Status post bilateral frontal craniotomies. No acute osseous abnormality is noted. Sinuses/Orbits: No acute finding. Other: None. CT CERVICAL SPINE FINDINGS Alignment: Normal. Skull base and vertebrae: Old C7 fracture is noted. No acute fracture is noted. Soft tissues and spinal canal: No prevertebral fluid or swelling. No visible canal hematoma. Disc levels: Moderate degenerative disc disease is noted at C3-4, C4-5, C5-6, C6-7 and C7-T1. Upper chest: Negative. Other: None. IMPRESSION: 1. Stable bilateral frontal chronic hygromas are noted, left greater than right. Mild diffuse cortical atrophy. No acute intracranial abnormality seen. 2. Multilevel degenerative disc disease. No acute abnormality seen in the cervical spine. Electronically Signed   By: Marijo Conception M.D.   On: 11/07/2020 12:00   CT Cervical Spine Wo Contrast  Result Date: 11/07/2020 CLINICAL DATA:  Fall. EXAM: CT HEAD WITHOUT CONTRAST CT CERVICAL SPINE WITHOUT CONTRAST TECHNIQUE: Multidetector CT imaging of the head and cervical spine was performed following the standard protocol without intravenous contrast. Multiplanar CT image  reconstructions of the cervical spine were also generated. COMPARISON:  May 26, 2020. FINDINGS: CT HEAD FINDINGS Brain: Stable bilateral frontal chronic hygromas are noted, left greater than right. Mild diffuse cortical atrophy is noted. Ventricular size is within normal limits. No evidence of acute hemorrhage, infarction or intracranial mass lesion is noted. Vascular: No hyperdense vessel or unexpected calcification. Skull: Status post bilateral frontal craniotomies. No acute osseous abnormality is noted. Sinuses/Orbits: No acute finding. Other: None. CT CERVICAL SPINE FINDINGS Alignment: Normal. Skull base and vertebrae: Old C7  fracture is noted. No acute fracture is noted. Soft tissues and spinal canal: No prevertebral fluid or swelling. No visible canal hematoma. Disc levels: Moderate degenerative disc disease is noted at C3-4, C4-5, C5-6, C6-7 and C7-T1. Upper chest: Negative. Other: None. IMPRESSION: 1. Stable bilateral frontal chronic hygromas are noted, left greater than right. Mild diffuse cortical atrophy. No acute intracranial abnormality seen. 2. Multilevel degenerative disc disease. No acute abnormality seen in the cervical spine. Electronically Signed   By: Marijo Conception M.D.   On: 11/07/2020 12:00   DG Hip Unilat W or Wo Pelvis 2-3 Views Right  Result Date: 11/07/2020 CLINICAL DATA:  Pain following fall EXAM: DG HIP (WITH OR WITHOUT PELVIS) 2-3V RIGHT COMPARISON:  None. FINDINGS: Frontal pelvis as well as frontal and lateral right hip images were obtained. There is an intertrochanteric femur fracture on the right with slight impaction at the fracture site. No other fracture. No dislocation. There is severe narrowing of the right hip joint with moderate narrowing of the left hip joint. No erosion. There is extensive distal aortic and iliac artery atherosclerotic calcification. There is also common femoral and superficial femoral artery atherosclerotic calcification bilaterally. There are  surgical clips in each inguinal region. IMPRESSION: Intertrochanteric femur fracture on the right with mild impaction at the fracture site. No other acute fracture. No dislocation. 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 There is multifocal atherosclerotic arterial vascular calcification. Electronically Signed   By: Lowella Grip III M.D.   On: 11/07/2020 11:47     Assessment and Plan:   Pre-op eval for fx or Rt femoral intertrochanteric fracture, with plan for ORIF tomorrow.   Echo is pending, pt has been seen and evaluated by Dr. Gwenlyn Found and if echo is stable pt  Is class III risk, 6.6% risk of major cardiac event but his is due to insulin and ESRD on HD.  No angina would not plan for ischemic work up,      DOE for 1 year in dialysis pt with HD MWF.  CXR read as clear. No chest pain.  Echo pending.  If normal no need to follow up with cards will leave to Dr. Renold Genta his [facility] PCP Fall may have been low BP difficult to say and now his story due to memory issues is different.  Would monitor on tele here.  ESRD on HD per nephrology and on midodrine with dialysis  IDM-2 per IM Hx of B/: SDH  Hx mild dementia Hypothyroidism per IM    Risk Assessment/Risk Scores:                For questions or updates, please contact [place] Please consult [email or web address] for contact info under    Signed, Cecilie Kicks, NP  11/07/2020 4:16 PM  Agree with note written by Cecilie Kicks RNP  ASTP for pre op clearance for ORIF secondary to fall and hip Fx. No prior H/O CAD. + CRF. ESRD on HD. Pt has mild dementia. Denies CP or change in breathing. Exam benign. EKG w/o acute change. 2D pending. Pt is at acceptable risk for surgery assuming he doesn't have have severe LVD or valvular HD. Will re eval post op   Quay Burow 11/08/2020 6:46 AM

## 2020-11-07 NOTE — Progress Notes (Signed)
  Echocardiogram 2D Echocardiogram has been performed.  Darlina Sicilian M 11/07/2020, 4:18 PM

## 2020-11-07 NOTE — H&P (Signed)
History and Physical    [name] BRA:[id number] DOB: April 23, 1941 DOA: 11/07/2020  PCP: Elby Showers, MD (Confirm with patient/family/NH records and if not entered, this has to be entered at [facility] point of entry) Patient coming from: Home  I have personally briefly reviewed patient's old medical records in [place]  Chief Complaint: Fall and right hip pain.  HPI: [name] is a 80 y.o. male with medical history significant of ESRD on HD, HTN, IDDM, diabetic neuropathy, hypothyroidism, gout, chronic ambulation dysfunction, presented with mechanical fall and fracture.    Patient fell down 2 nights ago, which he described as a mechanical fall.  Denied any loss of consciousness no prodromes of lightheadedness or weakness.  Unable to get up himself.  The right leg pain and weakness, wife asked a neighbor to help him to stand him up.  But patient obviously unable to put weight on the right leg because of the pain and stayed in bed the whole day yesterday.  Wife reported that patient has developed new onset of exertional dyspnea for about one year, with gradually worsening of exercise tolerance, no, will walk from front to the back of the house had to sit down to catch his breath. Never had chest pain. ED Course: Right intertrochanteric fracture.  Review of Systems: As per HPI otherwise 14 point review of systems negative.    Past Medical History:  Diagnosis Date  . Allergy   . Anemia   . Arthritis   . Cataract    bil cateracts removed  . Coronary artery disease   . Dementia arising in the senium and presenium ([place])   . Diabetes mellitus    Type 2  . Diverticulitis   . ED (erectile dysfunction)   . Elevated homocysteine   . ESRD (end stage renal disease) on dialysis ([place]) 03/2015  . GERD (gastroesophageal reflux disease)    pepto   . Gout   . Hiatal hernia   . Hyperlipidemia   . Hypertension   . Hypothyroidism   . Pneumonia   . PVD (peripheral vascular disease)  ([place])    has plastic aorta  . Renal insufficiency   . Seasonal allergies   . Sleep apnea    does not wear c-pap    Past Surgical History:  Procedure Laterality Date  . aortobifemoral bypass    . AV FISTULA PLACEMENT Left 12/01/2013   Procedure: ARTERIOVENOUS (AV) FISTULA CREATION- LEFT BRACHIOCEPHALIC;  Surgeon: Angelia Mould, MD;  Location: [place];  Service: Vascular;  Laterality: Left;  . [place] TRANSPOSITION Right 07/27/2014   Procedure: BASCILIC VEIN TRANSPOSITION;  Surgeon: Angelia Mould, MD;  Location: [place];  Service: Vascular;  Laterality: Right;  . BRAIN SURGERY  07/22/2018  . BREAST SURGERY     left - granulomatous mastitis  . BURR HOLE Bilateral 07/22/2018   Procedure: BILATERAL BURR HOLES;  Surgeon: Kristeen Miss, MD;  Location: [place];  Service: Neurosurgery;  Laterality: Bilateral;  . COLONOSCOPY    . 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 ENTEROSCOPY N/A 06/03/2018   Procedure: ENTEROSCOPY;  Surgeon: Lavena Bullion, DO;  Location: [place];  Service: Gastroenterology;  Laterality: N/A;  . ENTEROSCOPY N/A 11/20/2018   Procedure: ENTEROSCOPY;  Surgeon: Rush Landmark Telford Nab., MD;  Location: [facility] ENDOSCOPY;  Service: Gastroenterology;  Laterality: N/A;  . ENTEROSCOPY N/A 12/03/2019   Procedure: ENTEROSCOPY;  Surgeon: Lavena Bullion, DO;  Location: [facility] ENDOSCOPY;  Service: Gastroenterology;  Laterality: N/A;  push enteroscopy  . EYE SURGERY Bilateral    cataracts  . HEMODIALYSIS INPATIENT  01/17/2018      . HOT HEMOSTASIS N/A 06/03/2018   Procedure: HOT HEMOSTASIS (ARGON PLASMA COAGULATION/BICAP);  Surgeon: Lavena Bullion, DO;  Location[facility]COPY;  Service: Gastroenterology;  Laterality: N/A;  . HOT HEMOSTASIS N/A 11/20/2018   Procedure: HOT HEMOSTASIS (ARGON PLASMA COAGULATION/BICAP);  Surgeon: Irving Copas., MD;  Location: [place];  Service: Gastroenterology;  Laterality: N/A;  . HOT HEMOSTASIS N/A 12/03/2019    Procedure: HOT HEMOSTASIS (ARGON PLASMA COAGULATION/BICAP);  Surgeon: Lavena Bullion, DO;  Location: [facility] ENDOSCOPY;  Service: Gastroenterology;  Laterality: N/A;  . REVISION OF ARTERIOVENOUS GORETEX GRAFT Right 09/01/2019   Procedure: REVISION OF ARTERIOVENOUS FISTULA RIGHT ARM;  Surgeon: Serafina Mitchell, MD;  Location: [facility] OR;  Service: Vascular;  Laterality: Right;  . REVISON OF ARTERIOVENOUS FISTULA Left 02/09/2014   Procedure: REVISON OF LEFT ARTERIOVENOUS FISTULA - RESECTION OF RENDUNDANT VEIN;  Surgeon: Angelia Mould, MD;  Location: [place];  Service: Vascular;  Laterality: Left;  . SBO with lysis adhesions    . SHUNTOGRAM Left 04/19/2014   Procedure: FISTULOGRAM;  Surgeon: Angelia Mould, MD;  Location: [facility] CATH LAB;  Service: Cardiovascular;  Laterality: Left;  . UNILATERAL UPPER EXTREMEITY ANGIOGRAM N/A 07/12/2014   Procedure: UNILATERAL UPPER Anselmo Rod;  Surgeon: Angelia Mould, MD;  Location: [facility] CATH LAB;  Service: Cardiovascular;  Laterality: N/A;     reports that he quit smoking about 26 years ago. He has never used smokeless tobacco. He reports that he does not drink alcohol and does not use drugs.  Allergies  Allergen Reactions  . Ambien [Zolpidem Tartrate] Other (See Comments)    Hallucinations and "felt crazy"   . Penicillins Rash and Hives    Has patient had a PCN reaction causing immediate rash, facial/tongue/throat swelling, SOB or lightheadedness with hypotension: Yes Has patient had a PCN reaction causing severe rash involving mucus membranes or skin necrosis: Yes Has patient had a PCN reaction that required hospitalization: No Has patient had a PCN reaction occurring within the last 10 years: No If all of the above answers are "NO", then may proceed with Cephalosporin use.  Other reaction(s): HIVES    Family History  Problem Relation Age of Onset  . Aneurysm Mother        brain  . Heart disease Father   . Stroke Father   .  Hypertension Father   . Diabetes Father   . Dementia Neg Hx   . Colon cancer Neg Hx   . Esophageal cancer Neg Hx   . Pancreatic cancer Neg Hx   . Prostate cancer Neg Hx   . Rectal cancer Neg Hx   . Stomach cancer Neg Hx     Prior to Admission medications   Medication Sig Start Date End Date Taking? Authorizing Provider  acetaminophen (TYLENOL) 325 MG tablet Take 1-2 tablets (325-650 mg total) by mouth every 4 (four) hours as needed for mild pain. Patient taking differently: Take 650 mg by mouth in the morning and at bedtime. 08/01/18  Yes Love, Ivan Anchors, PA-C  albuterol (VENTOLIN HFA) 108 (90 Base) MCG/ACT inhaler Inhale 1 puff into the lungs daily as needed for wheezing or shortness of breath.   Yes [provider]  allopurinol (ZYLOPRIM) 100 MG tablet Take 100 mg by mouth daily.   Yes [provider]  camphor-menthol Timoteo Ace) lotion Apply 1  application topically 4 (four) times daily as needed for itching.    Yes [provider]  Carboxymethylcellulose Sodium (THERATEARS) 0.25 % SOLN Place 1 drop into both eyes as needed (for dryness).   Yes [provider]  cetirizine (ZYRTEC) 10 MG chewable tablet Chew 10 mg by mouth every Monday, Wednesday, and Friday.   Yes [provider]  clobetasol (TEMOVATE) 0.05 % external solution Apply 1 application topically 2 (two) times daily as needed (scalp irritation.).  04/19/15  Yes [provider]  denosumab (PROLIA) 60 MG/ML SOSY injection Inject 60 mg into the skin every 6 (six) months.   Yes [provider]  donepezil (ARICEPT) 10 MG tablet Take 1 tablet (10 mg total) by mouth at bedtime. 07/28/20  Yes Melvenia Beam, MD  fluticasone (FLONASE) 50 MCG/ACT nasal spray Place 1 spray into both nostrils daily. Patient taking differently: Place 1 spray into both nostrils daily as needed for allergies. 10/08/14  Yes Mikhail, Velta Addison, DO  hydrocortisone 2.5 % cream Apply 1 application topically 3  (three) times daily as needed (skin irritation (legs & arms)).  01/13/19  Yes [provider]  insulin aspart (NOVOLOG FLEXPEN) 100 UNIT/ML FlexPen INJECT 6-8 UNITS UNDER THE SKIN THREE TIMES DAILY BEFORE MEALS. DX:E11.65 Patient taking differently: Inject 6-8 Units into the skin See admin instructions. Inject 6-8 units into the skin three times a day with meals, PER SLIDING SCALE 11/25/18  Yes Elayne Snare, MD  ketoconazole (NIZORAL) 2 % shampoo Apply 1 application topically 2 (two) times a week. Patient taking differently: Apply 1 application topically daily as needed for irritation (SHAMPOO). 03/31/15  Yes Baxley, Cresenciano Lick, MD  levothyroxine (SYNTHROID, LEVOTHROID) 50 MCG tablet Take 1 tablet (50 mcg total) by mouth daily. Patient taking differently: Take 50 mcg by mouth daily before breakfast. 08/06/14  Yes Baxley, Cresenciano Lick, MD  lidocaine (LIDODERM) 5 % Place 1 patch onto the skin daily as needed (back pain).  11/12/19  Yes [provider]  memantine (NAMENDA) 10 MG tablet Take 1 tablet (10 mg total) by mouth 2 (two) times daily. 07/28/20  Yes Melvenia Beam, MD  midodrine (PROAMATINE) 10 MG tablet Take one pill prior to hemodialysis on MWF Patient taking differently: Take 10 mg by mouth See admin instructions. Take 10 mg by mouth prior to dialysis on Mon/Wed/Fri 08/13/18  Yes Love, Ivan Anchors, PA-C  Nutritional Supplements (FEEDING SUPPLEMENT, NEPRO CARB STEADY,) LIQD Take 237 mLs by mouth in the morning and at bedtime.    Yes [provider]  omeprazole (PRILOSEC) 40 MG capsule Take 1 capsule (40 mg total) by mouth daily. Patient taking differently: Take 40 mg by mouth daily before breakfast. 03/13/18  Yes Esterwood, Amy S, PA-C  sevelamer carbonate (RENVELA) 800 MG tablet Take 1,600-2,400 mg by mouth See admin instructions. Take 2,400 mg by mouth three times a day with meals and 1,600 mg with snacks on Sun/Tues/Thurs/Sat and 2,400 mg with breakfast and dinner and nothing with  snacks on Mon/Wed/Fri   Yes [provider]  simvastatin (ZOCOR) 20 MG tablet Take 20 mg by mouth at bedtime.   Yes [provider]  TRADJENTA 5 MG TABS tablet TAKE 1 TABLET DAILY Patient taking differently: Take 5 mg by mouth daily. 03/01/20  Yes Elayne Snare, MD  Blood Glucose Monitoring Suppl (FREESTYLE FREEDOM LITE) w/Device KIT Use to check blood sugar 2 times per day dx code E11.65 10/21/18   Elayne Snare, MD  dicyclomine (BENTYL) 20 MG tablet  Take 1 tablet (20 mg total) by mouth every 8 (eight) hours. Patient not taking: Reported on 11/07/2020 08/11/20   Cirigliano, Vito V, DO  FLUOCINOLONE ACETONIDE SCALP 0.01 % OIL Apply 1 application topically daily as needed (scalp irritation.).  04/28/15   [provider]  gabapentin (NEURONTIN) 100 MG capsule Take 100 mg by mouth at bedtime.    [provider]  glucose blood (FREESTYLE LITE) test strip USE THREE TIMES DAILY 01/25/20   Elayne Snare, MD  Insulin Pen Needle (PEN NEEDLES) 32G X 4 MM MISC 1 each by Does not apply route 3 (three) times daily. USE PEN NEEDLES TO INJECT INSULIN THREE TIMES DAILY. 11/25/18   Elayne Snare, MD  Lancets (FREESTYLE) lancets Use as instructed to check blood sugar 2 times per day dx code E11.65 02/22/16   Elayne Snare, MD    Physical Exam: Vitals:   11/07/20 1037 11/07/20 1247  BP: (!) 193/45 (!) 171/44  Pulse: 76 74  Resp: 12 15  Temp: 98.2 F (36.8 C)   TempSrc: Oral   SpO2: 100% 100%    Constitutional: NAD, calm, comfortable Vitals:   11/07/20 1037 11/07/20 1247  BP: (!) 193/45 (!) 171/44  Pulse: 76 74  Resp: 12 15  Temp: 98.2 F (36.8 C)   TempSrc: Oral   SpO2: 100% 100%   Eyes: PERRL, lids and conjunctivae normal ENMT: Mucous membranes are moist. Posterior pharynx clear of any exudate or lesions.Normal dentition.  Neck: normal, supple, no masses, no thyromegaly Respiratory: clear to auscultation bilaterally, no wheezing, bilateral fine crackles on bases. Normal  respiratory effort. No accessory muscle use.  Cardiovascular: Regular rate and rhythm, no murmurs / rubs / gallops. No extremity edema. 2+ pedal pulses. No carotid bruits.  Abdomen: no tenderness, no masses palpated. No hepatosplenomegaly. Bowel sounds positive.  Musculoskeletal: no clubbing / cyanosis. No joint deformity upper and lower extremities. Good ROM, no contractures. Normal muscle tone.  Skin: no rashes, lesions, ulcers. No induration Neurologic: CN 2-12 grossly intact. Sensation intact, DTR normal. Strength 5/5 in all 4.  Psychiatric: Normal judgment and insight. Alert and oriented x 3. Normal mood.     Labs on Admission: I have personally reviewed following labs and imaging studies  CBC: Recent Labs  Lab 11/07/20 1200  WBC 10.0  NEUTROABS 6.9  HGB 10.3*  HCT 30.8*  MCV 105.5*  PLT 462   Basic Metabolic Panel: Recent Labs  Lab 11/07/20 1200  NA 140  K 4.2  CL 101  CO2 26  GLUCOSE 134*  BUN 49*  CREATININE 10.06*  CALCIUM 9.8   GFR: CrCl cannot be calculated (Unknown ideal weight.). Liver Function Tests: Recent Labs  Lab 11/07/20 1200  AST 21  ALT 12  ALKPHOS 83  BILITOT 0.6  PROT 6.0*  ALBUMIN 2.9*   No results for input(s): LIPASE, AMYLASE in the last 168 hours. No results for input(s): AMMONIA in the last 168 hours. Coagulation Profile: No results for input(s): INR, PROTIME in the last 168 hours. Cardiac Enzymes: No results for input(s): CKTOTAL, CKMB, CKMBINDEX, TROPONINI in the last 168 hours. BNP (last 3 results) No results for input(s): PROBNP in the last 8760 hours. HbA1C: No results for input(s): HGBA1C in the last 72 hours. CBG: No results for input(s): GLUCAP in the last 168 hours. Lipid Profile: No results for input(s): CHOL, HDL, LDLCALC, TRIG, CHOLHDL, LDLDIRECT in the last 72 hours. Thyroid Function Tests: No results for input(s): TSH, T4TOTAL, FREET4, T3FREE, THYROIDAB in the last  72 hours. Anemia Panel: No results for  input(s): VITAMINB12, FOLATE, FERRITIN, TIBC, IRON, RETICCTPCT in the last 72 hours. Urine analysis:    Component Value Date/Time   COLORURINE YELLOW 07/21/2018 1044   APPEARANCEUR CLOUDY (A) 07/21/2018 1044   LABSPEC 1.009 07/21/2018 1044   PHURINE 9.0 (H) 07/21/2018 1044   GLUCOSEU NEGATIVE 07/21/2018 1044   HGBUR NEGATIVE 07/21/2018 1044   BILIRUBINUR NEGATIVE 07/21/2018 1044   BILIRUBINUR neg 02/14/2015 1115   KETONESUR NEGATIVE 07/21/2018 1044   PROTEINUR 100 (A) 07/21/2018 1044   UROBILINOGEN 0.2 02/20/2015 0944   NITRITE NEGATIVE 07/21/2018 1044   LEUKOCYTESUR SMALL (A) 07/21/2018 1044    Radiological Exams on Admission: DG Chest 1 View  Result Date: 11/07/2020 CLINICAL DATA:  Pain following fall EXAM: CHEST  1 VIEW COMPARISON:  May 26, 2020 FINDINGS: There is slight left base atelectasis. Lungs elsewhere clear. Heart size and pulmonary vascularity are within normal limits. There is aortic atherosclerosis. No evident adenopathy. No bone lesions. IMPRESSION: Slight left base atelectasis. Lungs otherwise clear. Heart size normal. Aortic Atherosclerosis (ICD10-I70.0). Electronically Signed   By: Lowella Grip III M.D.   On: 11/07/2020 11:51   CT Head Wo Contrast  Result Date: 11/07/2020 CLINICAL DATA:  Fall. EXAM: CT HEAD WITHOUT CONTRAST CT CERVICAL SPINE WITHOUT CONTRAST TECHNIQUE: Multidetector CT imaging of the head and cervical spine was performed following the standard protocol without intravenous contrast. Multiplanar CT image reconstructions of the cervical spine were also generated. COMPARISON:  May 26, 2020. FINDINGS: CT HEAD FINDINGS Brain: Stable bilateral frontal chronic hygromas are noted, left greater than right. Mild diffuse cortical atrophy is noted. Ventricular size is within normal limits. No evidence of acute hemorrhage, infarction or intracranial mass lesion is noted. Vascular: No hyperdense vessel or unexpected calcification. Skull: Status post  bilateral frontal craniotomies. No acute osseous abnormality is noted. Sinuses/Orbits: No acute finding. Other: None. CT CERVICAL SPINE FINDINGS Alignment: Normal. Skull base and vertebrae: Old C7 fracture is noted. No acute fracture is noted. Soft tissues and spinal canal: No prevertebral fluid or swelling. No visible canal hematoma. Disc levels: Moderate degenerative disc disease is noted at C3-4, C4-5, C5-6, C6-7 and C7-T1. Upper chest: Negative. Other: None. IMPRESSION: 1. Stable bilateral frontal chronic hygromas are noted, left greater than right. Mild diffuse cortical atrophy. No acute intracranial abnormality seen. 2. Multilevel degenerative disc disease. No acute abnormality seen in the cervical spine. Electronically Signed   By: Marijo Conception M.D.   On: 11/07/2020 12:00   CT Cervical Spine Wo Contrast  Result Date: 11/07/2020 CLINICAL DATA:  Fall. EXAM: CT HEAD WITHOUT CONTRAST CT CERVICAL SPINE WITHOUT CONTRAST TECHNIQUE: Multidetector CT imaging of the head and cervical spine was performed following the standard protocol without intravenous contrast. Multiplanar CT image reconstructions of the cervical spine were also generated. COMPARISON:  May 26, 2020. FINDINGS: CT HEAD FINDINGS Brain: Stable bilateral frontal chronic hygromas are noted, left greater than right. Mild diffuse cortical atrophy is noted. Ventricular size is within normal limits. No evidence of acute hemorrhage, infarction or intracranial mass lesion is noted. Vascular: No hyperdense vessel or unexpected calcification. Skull: Status post bilateral frontal craniotomies. No acute osseous abnormality is noted. Sinuses/Orbits: No acute finding. Other: None. CT CERVICAL SPINE FINDINGS Alignment: Normal. Skull base and vertebrae: Old C7 fracture is noted. No acute fracture is noted. Soft tissues and spinal canal: No prevertebral fluid or swelling. No visible canal hematoma. Disc levels: Moderate degenerative disc disease is noted at  C3-4, C4-5, C5-6,  C6-7 and C7-T1. Upper chest: Negative. Other: None. IMPRESSION: 1. Stable bilateral frontal chronic hygromas are noted, left greater than right. Mild diffuse cortical atrophy. No acute intracranial abnormality seen. 2. Multilevel degenerative disc disease. No acute abnormality seen in the cervical spine. Electronically Signed   By: Marijo Conception M.D.   On: 11/07/2020 12:00   DG Hip Unilat W or Wo Pelvis 2-3 Views Right  Result Date: 11/07/2020 CLINICAL DATA:  Pain following fall EXAM: DG HIP (WITH OR WITHOUT PELVIS) 2-3V RIGHT COMPARISON:  None. FINDINGS: Frontal pelvis as well as frontal and lateral right hip images were obtained. There is an intertrochanteric femur fracture on the right with slight impaction at the fracture site. No other fracture. No dislocation. There is severe narrowing of the right hip joint with moderate narrowing of the left hip joint. No erosion. There is extensive distal aortic and iliac artery atherosclerotic calcification. There is also common femoral and superficial femoral artery atherosclerotic calcification bilaterally. There are surgical clips in each inguinal region. IMPRESSION: Intertrochanteric femur fracture on the right with mild impaction at the fracture site. No other acute fracture. No dislocation. 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 There is multifocal atherosclerotic arterial vascular calcification. Electronically Signed   By: Lowella Grip III M.D.   On: 11/07/2020 11:47    EKG: Independently reviewed. 1st degree AV block  Assessment/Plan Active Problems:   Hip fracture, unspecified laterality, closed, initial encounter [facility])   Hip fracture ([place])  (please populate well all problems here in Problem List. (For example, if patient is on BP meds at home and you resume or decide to hold them, it is a problem that needs to be her. Same for CAD, COPD, HLD and so on)  Right femoral  intertrochanteric fracture -plan for ORIF as per orthopedic surgery -hold off chemical DVT prophylaxis.  Question of CHF, new onset -'s x-ray showed lungs are congested especially if there is a fluid in the fissure on the right side indicating fluid overload. -And worsening of exercise tolerance raise concern about CHF. -Check BNP, echocardiogram and consult cardiology -discussed with nephrology and patient will get HD today. -Communicated with orthopedic surgery about cardiology consult.  ESRD on HD -as scheduled Monday Wednesday Friday.  IDM -Sliding scale  Diabetic neuropathy -gabapentin  Hypothyroidism -continue Synthroid  Mild dementia -continue home meds  Hx of B/L SDH -CT head reassuring.   DVT prophylaxis: SCD Code Status: Partial code, no intubation in case of cardiopulmonary arrest Family Communication: Wife over phone Disposition Plan: Expect 3 to 4 days hospital stay for ORIF and PT evaluation afterwards. Expect discharge to SNF. Consults called: Cardiology, orthopedic surgery. Admission status: Medsurg admit   Lequita Halt MD [facility] Pager [phone]  11/07/2020, 2:44 PM

## 2020-11-07 NOTE — ED Provider Notes (Signed)
[facility] EMERGENCY DEPARTMENT Provider Note   CSN: [id number] Arrival date & time: 11/07/20  1034     History Chief Complaint  Patient presents with  . Fall  . Hip Pain    [name] is a 80 y.o. 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 Not anticoagulated.  HPI Patient presents to emergency room today via EMS with chief complaint of right hip pain x2 days.  Patient states last night he was standing in front of the mirror yesterday morning and suddenly felt weak and fell.  He thinks he might of passed out however is unsure.  Wife heard the fall.  She was unable to help him get up and had to call the neighbors.  Neighbors help get him back into bed and since then patient has been unable to ambulate or bear weight on right lower extremity. He states he hit his head on the wall.  Typically walks with walker or cane.  He is endorsing constant sharp pain in his right hip.  It radiates down his right leg.  He did not take any medications for symptoms prior to arrival.  He is also endorsing a productive cough x 2 weeks.  He wonders if he has pneumonia.  He denies any fever, chills, chest pain, shortness of breath, numbness, tingling, weakness.  Patient's last dialysis session was Friday, he missed today due to being here in the emergency department. He denies any recent illness or change in medications.  Patient's wife provides additional history over the phone.  She states patient has very poor short-term memory. At baseline he is mobile with walker.  Chart review shows bilateral subdural hematoma status post hematoma evacuation with chronic bilateral hematomas noted. The patient is described as having memory difficulties predating these traumatic subdural hematoma. In 2019.  Past Medical History:  Diagnosis Date  . Allergy   . Anemia   . Arthritis    . Cataract    bil cateracts removed  . Coronary artery disease   . Dementia arising in the senium and presenium ([place])   . Diabetes mellitus    Type 2  . Diverticulitis   . ED (erectile dysfunction)   . Elevated homocysteine   . ESRD (end stage renal disease) on dialysis ([place]) 03/2015  . GERD (gastroesophageal reflux disease)    pepto   . Gout   . Hiatal hernia   . Hyperlipidemia   . Hypertension   . Hypothyroidism   . Pneumonia   . PVD (peripheral vascular disease) ([place])    has plastic aorta  . Renal insufficiency   . Seasonal allergies   . Sleep apnea    does not wear c-pap    Patient Active Problem List   Diagnosis Date Noted  . Rectal bleeding 04/22/2020  . AVM (arteriovenous malformation) of small bowel, acquired   . Hiatal hernia   . Schatzki's ring of distal esophagus   . Melena 11/19/2018  . Leukocytosis   . Anemia   . TBI (traumatic brain injury) ([place]) 07/29/2018  . 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 Loose stools   . Dementia arising in the senium and presenium ([place])   . 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 Multiple fractures of ribs, left side, initial encounter for closed fracture 07/21/2018  . Subdural hematoma ([place]) 07/21/2018  .  Angiodysplasia of stomach   . Occult GI bleeding   . GIB (gastrointestinal bleeding) 06/01/2018  . Low back pain 01/17/2018  . Anorexia   . Dyspnea   . Macrocytic anemia 06/04/2016  . Thrombocytopenia ([place]) 06/04/2016  . Exertional dyspnea 06/04/2016  . Arm paresthesia, left 06/04/2016  . Dyspnea on exertion 06/04/2016  . Tenderness of right calf 06/04/2016  . Mild cognitive impairment with memory loss 01/12/2016  . ESRD on dialysis ([place]) 05/17/2015  . Hypoglycemia   . Weakness 02/18/2015  . OSA (obstructive sleep apnea) 02/02/2015  . Hyperkalemia 10/05/2014  . End stage renal disease ([place]) 11/18/2013  . BPH (benign prostatic hyperplasia) 11/22/2012  . Peripheral vascular  disease ([place]) 12/24/2011  . Hyperparathyroidism ([place]) 12/24/2011  . Hypothyroidism 12/24/2011  . Allergic rhinitis 12/24/2011  . Erectile dysfunction 12/24/2011  . Insulin dependent diabetes mellitus 09/03/2008  . Hyperlipidemia 09/03/2008  . Essential hypertension 08/30/2008  . PANCREATITIS, HX OF 08/30/2008  . RENAL FAILURE, ACUTE, HX OF 08/30/2008  . DIVERTICULOSIS, COLON 06/08/2003    Past Surgical History:  Procedure Laterality Date  . aortobifemoral bypass    . AV FISTULA PLACEMENT Left 12/01/2013   Procedure: ARTERIOVENOUS (AV) FISTULA CREATION- LEFT BRACHIOCEPHALIC;  Surgeon: Angelia Mould, MD;  Location: [facility];  Service: Vascular;  Laterality: Left;  . [place] TRANSPOSITION Right 07/27/2014   Procedure: BASCILIC VEIN TRANSPOSITION;  Surgeon: Angelia Mould, MD;  Location: [facility];  Service: Vascular;  Laterality: Right;  . BRAIN SURGERY  07/22/2018  . BREAST SURGERY     left - granulomatous mastitis  . BURR HOLE Bilateral 07/22/2018   Procedure: BILATERAL BURR HOLES;  Surgeon: Kristeen Miss, MD;  Location: [facility];  Service: Neurosurgery;  Laterality: Bilateral;  . COLONOSCOPY    . 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 ([facility]X)  1992  . ENTEROSCOPY N/A 06/03/2018   Procedure: ENTEROSCOPY;  Surgeon: Lavena Bullion, DO;  Location: [facility];  Service: Gastroenterology;  Laterality: N/A;  . ENTEROSCOPY N/A 11/20/2018   Procedure: ENTEROSCOPY;  Surgeon: Rush Landmark Telford Nab., MD;  Location[facility];  Service: Gastroenterology;  Laterality: N/A;  . ENTEROSCOPY N/A 12/03/2019   Procedure: ENTEROSCOPY;  Surgeon: Lavena Bullion, DO;  Location: [facility] ENDOSCOPY;  Service: Gastroenterology;  Laterality: N/A;  push enteroscopy  . EYE SURGERY Bilateral    cataracts  . HEMODIALYSIS INPATIENT  01/17/2018      . HOT HEMOSTASIS N/A 06/03/2018   Procedure: HOT HEMOSTASIS (ARGON PLASMA COAGULATION/BICAP);  Surgeon: Lavena Bullion, DO;  Location: [facility] ENDOSCOPY;   Service: Gastroenterology;  Laterality: N/A;  . HOT HEMOSTASIS N/A 11/20/2018   Procedure: HOT HEMOSTASIS (ARGON PLASMA COAGULATION/BICAP);  Surgeon: Irving Copas., MD;  Location: [facility];  Service: Gastroenterology;  Laterality: N/A;  . HOT HEMOSTASIS N/A 12/03/2019   Procedure: HOT HEMOSTASIS (ARGON PLASMA COAGULATION/BICAP);  Surgeon: Lavena Bullion, DO;  Location: [facility] ENDOSCOPY;  Service: Gastroenterology;  Laterality: N/A;  . REVISION OF ARTERIOVENOUS GORETEX GRAFT Right 09/01/2019   Procedure: REVISION OF ARTERIOVENOUS FISTULA RIGHT ARM;  Surgeon: Serafina Mitchell, MD;  Location: [facility] OR;  Service: Vascular;  Laterality: Right;  . REVISON OF ARTERIOVENOUS FISTULA Left 02/09/2014   Procedure: REVISON OF LEFT ARTERIOVENOUS FISTULA - RESECTION OF RENDUNDANT VEIN;  Surgeon: Angelia Mould, MD;  Location: [facility];  Service: Vascular;  Laterality: Left;  . SBO with lysis adhesions    . SHUNTOGRAM Left 04/19/2014   Procedure: FISTULOGRAM;  Surgeon: Angelia Mould, MD;  Location:  [facility] CATH LAB;  Service: Cardiovascular;  Laterality: Left;  . UNILATERAL UPPER EXTREMEITY ANGIOGRAM N/A 07/12/2014   Procedure: UNILATERAL UPPER Anselmo Rod;  Surgeon: Angelia Mould, MD;  Location: [facility] LAB;  Service: Cardiovascular;  Laterality: N/A;       Family History  Problem Relation Age of Onset  . Aneurysm Mother        brain  . Heart disease Father   . Stroke Father   . Hypertension Father   . Diabetes Father   . Dementia Neg Hx   . Colon cancer Neg Hx   . Esophageal cancer Neg Hx   . Pancreatic cancer Neg Hx   . Prostate cancer Neg Hx   . Rectal cancer Neg Hx   . Stomach cancer Neg Hx     Social History   Tobacco Use  . Smoking status: Former Smoker    Quit date: 09/24/1994    Years since quitting: 26.1  . Smokeless tobacco: Never Used  Vaping Use  . Vaping Use: Never used  Substance Use Topics  . Alcohol use: No    Alcohol/week: 0.0 standard  drinks    Comment: Quit Oct. 1977 ("somewhat heavy")  . Drug use: No    Home Medications Prior to Admission medications   Medication Sig Start Date End Date Taking? Authorizing Provider  acetaminophen (TYLENOL) 325 MG tablet Take 1-2 tablets (325-650 mg total) by mouth every 4 (four) hours as needed for mild pain. Patient taking differently: Take 650 mg by mouth in the morning and at bedtime. 08/01/18  Yes Love, Ivan Anchors, PA-C  albuterol (VENTOLIN HFA) 108 (90 Base) MCG/ACT inhaler Inhale 1 puff into the lungs daily as needed for wheezing or shortness of breath.   Yes [provider]  allopurinol (ZYLOPRIM) 100 MG tablet Take 100 mg by mouth daily.   Yes [provider]  camphor-menthol Timoteo Ace) lotion Apply 1 application topically 4 (four) times daily as needed for itching.    Yes [provider]  Carboxymethylcellulose Sodium (THERATEARS) 0.25 % SOLN Place 1 drop into both eyes as needed (for dryness).   Yes [provider]  cetirizine (ZYRTEC) 10 MG chewable tablet Chew 10 mg by mouth every Monday, Wednesday, and Friday.   Yes [provider]  clobetasol (TEMOVATE) 0.05 % external solution Apply 1 application topically 2 (two) times daily as needed (scalp irritation.).  04/19/15  Yes [provider]  denosumab (PROLIA) 60 MG/ML SOSY injection Inject 60 mg into the skin every 6 (six) months.   Yes [provider]  donepezil (ARICEPT) 10 MG tablet Take 1 tablet (10 mg total) by mouth at bedtime. 07/28/20  Yes Melvenia Beam, MD  fluticasone (FLONASE) 50 MCG/ACT nasal spray Place 1 spray into both nostrils daily. Patient taking differently: Place 1 spray into both nostrils daily as needed for allergies. 10/08/14  Yes Mikhail, Velta Addison, DO  hydrocortisone 2.5 % cream Apply 1 application topically 3 (three) times daily as needed (skin irritation (legs & arms)).  01/13/19  Yes [provider]  insulin aspart (NOVOLOG FLEXPEN) 100  UNIT/ML FlexPen INJECT 6-8 UNITS UNDER THE SKIN THREE TIMES DAILY BEFORE MEALS. DX:E11.65 Patient taking differently: Inject 6-8 Units into the skin See admin instructions. Inject 6-8 units into the skin three times a day with meals, PER SLIDING SCALE 11/25/18  Yes Elayne Snare, MD  ketoconazole (NIZORAL) 2 % shampoo Apply 1 application topically 2 (two) times a week. Patient taking differently: Apply 1 application topically  daily as needed for irritation. 03/31/15  Yes Baxley, Cresenciano Lick, MD  levothyroxine (SYNTHROID, LEVOTHROID) 50 MCG tablet Take 1 tablet (50 mcg total) by mouth daily. Patient taking differently: Take 50 mcg by mouth daily before breakfast. 08/06/14  Yes Baxley, Cresenciano Lick, MD  lidocaine (LIDODERM) 5 % Place 1 patch onto the skin daily as needed (back pain).  11/12/19  Yes [provider]  memantine (NAMENDA) 10 MG tablet Take 1 tablet (10 mg total) by mouth 2 (two) times daily. 07/28/20  Yes Melvenia Beam, MD  midodrine (PROAMATINE) 10 MG tablet Take one pill prior to hemodialysis on MWF Patient taking differently: Take 10 mg by mouth See admin instructions. Take 10 mg by mouth prior to dialysis on Mon/Wed/Fri 08/13/18  Yes Love, Ivan Anchors, PA-C  Nutritional Supplements (FEEDING SUPPLEMENT, NEPRO CARB STEADY,) LIQD Take 237 mLs by mouth in the morning and at bedtime.    Yes [provider]  omeprazole (PRILOSEC) 40 MG capsule Take 1 capsule (40 mg total) by mouth daily. Patient taking differently: Take 40 mg by mouth daily before breakfast. 03/13/18  Yes Esterwood, Amy S, PA-C  sevelamer carbonate (RENVELA) 800 MG tablet Take 1,200-2,400 mg by mouth See admin instructions. Take 2,400 mg by mouth three times a day with meals and 1,600 mg with snacks on Sun/Tues/Thurs/Sat and 2,400 mg with breakfast and dinner  on Mon/Wed/Fri and nothing with snacks   Yes [provider]  simvastatin (ZOCOR) 20 MG tablet Take 20 mg by mouth at bedtime.   Yes [provider]   TRADJENTA 5 MG TABS tablet TAKE 1 TABLET DAILY Patient taking differently: Take 5 mg by mouth daily. 03/01/20  Yes Elayne Snare, MD  Blood Glucose Monitoring Suppl (FREESTYLE FREEDOM LITE) w/Device KIT Use to check blood sugar 2 times per day dx code E11.65 10/21/18   Elayne Snare, MD  dicyclomine (BENTYL) 20 MG tablet Take 1 tablet (20 mg total) by mouth every 8 (eight) hours. 08/11/20   Cirigliano, Vito V, DO  FLUOCINOLONE ACETONIDE SCALP 0.01 % OIL Apply 1 application topically daily as needed (scalp irritation.).  04/28/15   [provider]  gabapentin (NEURONTIN) 100 MG capsule Take 100 mg by mouth at bedtime.    [provider]  glucose blood (FREESTYLE LITE) test strip USE THREE TIMES DAILY 01/25/20   Elayne Snare, MD  Insulin Pen Needle (PEN NEEDLES) 32G X 4 MM MISC 1 each by Does not apply route 3 (three) times daily. USE PEN NEEDLES TO INJECT INSULIN THREE TIMES DAILY. 11/25/18   Elayne Snare, MD  Lancets (FREESTYLE) lancets Use as instructed to check blood sugar 2 times per day dx code E11.65 02/22/16   Elayne Snare, MD    Allergies    Ambien [zolpidem tartrate] and Penicillins  Review of Systems   Review of Systems All other systems are reviewed and are negative for acute change except as noted in the HPI.  Physical Exam Updated Vital Signs BP (!) 193/45 (BP Location: Left Arm)   Pulse 76   Temp 98.2 F (36.8 C) (Oral)   Resp 12   SpO2 100%   Physical Exam Vitals and nursing note reviewed.  Constitutional:      Appearance: He is well-developed. He is not ill-appearing or toxic-appearing.     Comments: Laying flat on stretcher in position of comfort  HENT:     Head: Normocephalic and atraumatic.     Comments: No tenderness to palpation of skull. No deformities  or crepitus noted. No open wounds, abrasions or lacerations.    Nose: Nose normal.  Eyes:     General: No scleral icterus.       Right eye: No discharge.        Left eye: No discharge.      Conjunctiva/sclera: Conjunctivae normal.  Neck:     Vascular: No JVD.     Comments: Full ROM intact without spinous process TTP. No bony stepoffs or deformities, no paraspinous muscle TTP or muscle spasms. No rigidity or meningeal signs. No bruising, erythema, or swelling.  Cardiovascular:     Rate and Rhythm: Normal rate and regular rhythm.     Pulses:          Dorsalis pedis pulses are 1+ on the right side and 1+ on the left side.     Heart sounds: Normal heart sounds.  Pulmonary:     Effort: Pulmonary effort is normal.     Breath sounds: Normal breath sounds.  Abdominal:     General: There is no distension.  Musculoskeletal:     Cervical back: Normal range of motion.     Comments: Pelvis is stable. Tender to palpation of right hip with right foot externally rotated. Decreased ROM of right knee 2/2 to pain. Full ROM of right ankle. Compartments soft in right lower extremity.  Skin:    General: Skin is warm and dry.     Capillary Refill: Capillary refill takes less than 2 seconds.     Comments: Equal tactile temperature in all extremities.  Neurological:     Mental Status: He is oriented to person, place, and time.     GCS: GCS eye subscore is 4. GCS verbal subscore is 5. GCS motor subscore is 6.     Comments: Speech is clear and goal oriented, follows commands CN III-XII intact, no facial droop Normal strength in upper and lower extremities bilaterally including dorsiflexion and plantar flexion, strong and equal grip strength Sensation normal to light and sharp touch    Psychiatric:        Behavior: Behavior normal.     ED Results / Procedures / Treatments   Labs (all labs ordered are listed, but only abnormal results are displayed) Labs Reviewed  COMPREHENSIVE METABOLIC PANEL - Abnormal; Notable for the following components:      Result Value   Glucose, Bld 134 (*)    BUN 49 (*)    Creatinine, Ser 10.06 (*)    Total Protein 6.0 (*)    Albumin 2.9 (*)    GFR,  Estimated 5 (*)    All other components within normal limits  CBC WITH DIFFERENTIAL/PLATELET - Abnormal; Notable for the following components:   RBC 2.92 (*)    Hemoglobin 10.3 (*)    HCT 30.8 (*)    MCV 105.5 (*)    MCH 35.3 (*)    RDW 17.2 (*)    All other components within normal limits  RESP PANEL BY RT-PCR (FLU A&B, COVID) [id number]  TYPE AND SCREEN    EKG EKG Interpretation  Date/Time:  Monday November 07 2020 12:44:33 EST Ventricular Rate:  71 PR Interval:  256 QRS Duration: 78 QT Interval:  410 QTC Calculation: 445 R Axis:   -30 Text Interpretation: Sinus rhythm with 1st degree A-V block Left axis deviation Nonspecific T wave abnormality Confirmed by Lajean Saver [phone]) on 11/07/2020 1:59:41 PM   Radiology DG Chest 1 View  Result Date: 11/07/2020 CLINICAL DATA:  Pain following fall  EXAM: CHEST  1 VIEW COMPARISON:  May 26, 2020 FINDINGS: There is slight left base atelectasis. Lungs elsewhere clear. Heart size and pulmonary vascularity are within normal limits. There is aortic atherosclerosis. No evident adenopathy. No bone lesions. IMPRESSION: Slight left base atelectasis. Lungs otherwise clear. Heart size normal. Aortic Atherosclerosis (ICD10-I70.0). Electronically Signed   By: Lowella Grip III M.D.   On: 11/07/2020 11:51   CT Head Wo Contrast  Result Date: 11/07/2020 CLINICAL DATA:  Fall. EXAM: CT HEAD WITHOUT CONTRAST CT CERVICAL SPINE WITHOUT CONTRAST TECHNIQUE: Multidetector CT imaging of the head and cervical spine was performed following the standard protocol without intravenous contrast. Multiplanar CT image reconstructions of the cervical spine were also generated. COMPARISON:  May 26, 2020. FINDINGS: CT HEAD FINDINGS Brain: Stable bilateral frontal chronic hygromas are noted, left greater than right. Mild diffuse cortical atrophy is noted. Ventricular size is within normal limits. No evidence of acute hemorrhage, infarction or intracranial mass lesion  is noted. Vascular: No hyperdense vessel or unexpected calcification. Skull: Status post bilateral frontal craniotomies. No acute osseous abnormality is noted. Sinuses/Orbits: No acute finding. Other: None. CT CERVICAL SPINE FINDINGS Alignment: Normal. Skull base and vertebrae: Old C7 fracture is noted. No acute fracture is noted. Soft tissues and spinal canal: No prevertebral fluid or swelling. No visible canal hematoma. Disc levels: Moderate degenerative disc disease is noted at C3-4, C4-5, C5-6, C6-7 and C7-T1. Upper chest: Negative. Other: None. IMPRESSION: 1. Stable bilateral frontal chronic hygromas are noted, left greater than right. Mild diffuse cortical atrophy. No acute intracranial abnormality seen. 2. Multilevel degenerative disc disease. No acute abnormality seen in the cervical spine. Electronically Signed   By: Marijo Conception M.D.   On: 11/07/2020 12:00   CT Cervical Spine Wo Contrast  Result Date: 11/07/2020 CLINICAL DATA:  Fall. EXAM: CT HEAD WITHOUT CONTRAST CT CERVICAL SPINE WITHOUT CONTRAST TECHNIQUE: Multidetector CT imaging of the head and cervical spine was performed following the standard protocol without intravenous contrast. Multiplanar CT image reconstructions of the cervical spine were also generated. COMPARISON:  May 26, 2020. FINDINGS: CT HEAD FINDINGS Brain: Stable bilateral frontal chronic hygromas are noted, left greater than right. Mild diffuse cortical atrophy is noted. Ventricular size is within normal limits. No evidence of acute hemorrhage, infarction or intracranial mass lesion is noted. Vascular: No hyperdense vessel or unexpected calcification. Skull: Status post bilateral frontal craniotomies. No acute osseous abnormality is noted. Sinuses/Orbits: No acute finding. Other: None. CT CERVICAL SPINE FINDINGS Alignment: Normal. Skull base and vertebrae: Old C7 fracture is noted. No acute fracture is noted. Soft tissues and spinal canal: No prevertebral fluid or  swelling. No visible canal hematoma. Disc levels: Moderate degenerative disc disease is noted at C3-4, C4-5, C5-6, C6-7 and C7-T1. Upper chest: Negative. Other: None. IMPRESSION: 1. Stable bilateral frontal chronic hygromas are noted, left greater than right. Mild diffuse cortical atrophy. No acute intracranial abnormality seen. 2. Multilevel degenerative disc disease. No acute abnormality seen in the cervical spine. Electronically Signed   By: Marijo Conception M.D.   On: 11/07/2020 12:00   DG Hip Unilat W or Wo Pelvis 2-3 Views Right  Result Date: 11/07/2020 CLINICAL DATA:  Pain following fall EXAM: DG HIP (WITH OR WITHOUT PELVIS) 2-3V RIGHT COMPARISON:  None. FINDINGS: Frontal pelvis as well as frontal and lateral right hip images were obtained. There is an intertrochanteric femur fracture on the right with slight impaction at the fracture site. No other fracture. No dislocation. There is severe narrowing  of the right hip joint with moderate narrowing of the left hip joint. No erosion. There is extensive distal aortic and iliac artery atherosclerotic calcification. There is also common femoral and superficial femoral artery atherosclerotic calcification bilaterally. There are surgical clips in each inguinal region. IMPRESSION: Intertrochanteric femur fracture on the right with mild impaction at the fracture site. No other acute fracture. No dislocation. 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 There is multifocal atherosclerotic arterial vascular calcification. Electronically Signed   By: Lowella Grip III M.D.   On: 11/07/2020 11:47    Procedures Procedures   Medications Ordered in ED Medications  fentaNYL (SUBLIMAZE) injection 12.5 mcg (12.5 mcg Intravenous Given 11/07/20 1328)  ondansetron (ZOFRAN) injection 4 mg (4 mg Intravenous Given 11/07/20 1330)    ED Course  I have reviewed the triage vital signs and the nursing notes.  Pertinent  labs & imaging results that were available during my care of the patient were reviewed by me and considered in my medical decision making (see chart for details).    MDM Rules/Calculators/A&P                          History provided by patient with additional history obtained from chart review.    80 yo male presenting after fall yesterday with right hip pain. Afebrile, HDS, diastolic pressure soft. Right lower extremity externally rotated. He is neurovascularly intact distally. IV analgesia ordered with low dose fentanyl with zofran.   EKG without stemi. CBC without leukocytosis, anemia consistent with baseline. CMP without significant electrolyte derangement, BUN/Cr 49/10.6 patient supposed to dialyze today. Type and screen and covid test ordered. I viewed all images and agree with radiologist impression. CT head without signs of acute head bleed or injury. CT cervical spine negative. Chest xray without acute infectious process or signs of volume overload. Xray right hip shows intertrochanteric femur fracture on the right with mild impaction at the fracture site.  Consulted on call orthopedist Hilbert Odor PA-C, ortho will see in consult Dr. Lucia Gaskins. Patient will need medical admission. Spoke with Dr. Roosevelt Locks with hospitalist service who agrees to assume care of patient and bring into the hospital for further evaluation and management.      Portions of this note were generated with [employer]. Dictation errors may occur despite best attempts at proofreading.    Final Clinical Impression(s) / ED Diagnoses Final diagnoses:  Closed nondisplaced intertrochanteric fracture of right femur, initial encounter [facility])  Fall, initial encounter    Rx / DC Orders ED Discharge Orders    None       Lewanda Rife 11/07/20 1429    Lajean Saver, MD 11/08/20 1423

## 2020-11-07 NOTE — Consult Note (Signed)
Reason for Consult:Left hip fx Referring Physician: Rosine Abe Time called: 1301 Time at bedside: [place] is an 80 y.o. male.  HPI: [name] was at home. He stood up to look in the mirror and got light-headed and passed out. He woke up on the floor with right hip pain and could not get up. He was brought to the ED where x-rays showed a right hip fx and orthopedic surgery was consulted. He c/o localize pain to the area. He lives as home with his wife and generally uses a cane or a RW to ambulate though can go without.  Past Medical History:  Diagnosis Date  . Allergy   . Anemia   . Arthritis   . Cataract    bil cateracts removed  . Coronary artery disease   . Dementia arising in the senium and presenium ([place])   . Diabetes mellitus    Type 2  . Diverticulitis   . ED (erectile dysfunction)   . Elevated homocysteine   . ESRD (end stage renal disease) on dialysis ([place]) 03/2015  . GERD (gastroesophageal reflux disease)    pepto   . Gout   . Hiatal hernia   . Hyperlipidemia   . Hypertension   . Hypothyroidism   . Pneumonia   . PVD (peripheral vascular disease) ([place])    has plastic aorta  . Renal insufficiency   . Seasonal allergies   . Sleep apnea    does not wear c-pap    Past Surgical History:  Procedure Laterality Date  . aortobifemoral bypass    . AV FISTULA PLACEMENT Left 12/01/2013   Procedure: ARTERIOVENOUS (AV) FISTULA CREATION- LEFT BRACHIOCEPHALIC;  Surgeon: Angelia Mould, MD;  Location: [place];  Service: Vascular;  Laterality: Left;  . [place] TRANSPOSITION Right 07/27/2014   Procedure: BASCILIC VEIN TRANSPOSITION;  Surgeon: Angelia Mould, MD;  Location: [place];  Service: Vascular;  Laterality: Right;  . BRAIN SURGERY  07/22/2018  . BREAST SURGERY     left - granulomatous mastitis  . BURR HOLE Bilateral 07/22/2018   Procedure: BILATERAL BURR HOLES;  Surgeon: Kristeen Miss, MD;  Location: [place];  Service: Neurosurgery;  Laterality:  Bilateral;  . COLONOSCOPY    . 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 ENTEROSCOPY N/A 06/03/2018   Procedure: ENTEROSCOPY;  Surgeon: Lavena Bullion, DO;  Location: [place];  Service: Gastroenterology;  Laterality: N/A;  . ENTEROSCOPY N/A 11/20/2018   Procedure: ENTEROSCOPY;  Surgeon: Rush Landmark Telford Nab., MD;  Location: [facility] ENDOSCOPY;  Service: Gastroenterology;  Laterality: N/A;  . ENTEROSCOPY N/A 12/03/2019   Procedure: ENTEROSCOPY;  Surgeon: Lavena Bullion, DO;  Location: [facility] ENDOSCOPY;  Service: Gastroenterology;  Laterality: N/A;  push enteroscopy  . EYE SURGERY Bilateral    cataracts  . HEMODIALYSIS INPATIENT  01/17/2018      . HOT HEMOSTASIS N/A 06/03/2018   Procedure: HOT HEMOSTASIS (ARGON PLASMA COAGULATION/BICAP);  Surgeon: Lavena Bullion, DO;  Location: [facility] ENDOSCOPY;  Service: Gastroenterology;  Laterality: N/A;  . HOT HEMOSTASIS N/A 11/20/2018   Procedure: HOT HEMOSTASIS (ARGON PLASMA COAGULATION/BICAP);  Surgeon: Irving Copas., MD;  Location: [place];  Service: Gastroenterology;  Laterality: N/A;  . HOT HEMOSTASIS N/A 12/03/2019   Procedure: HOT HEMOSTASIS (ARGON PLASMA COAGULATION/BICAP);  Surgeon: Lavena Bullion, DO;  Location: [facility] ENDOSCOPY;  Service: Gastroenterology;  Laterality: N/A;  . REVISION OF ARTERIOVENOUS GORETEX GRAFT Right 09/01/2019   Procedure: REVISION OF ARTERIOVENOUS  FISTULA RIGHT ARM;  Surgeon: Serafina Mitchell, MD;  Location: [facility] OR;  Service: Vascular;  Laterality: Right;  . REVISON OF ARTERIOVENOUS FISTULA Left 02/09/2014   Procedure: REVISON OF LEFT ARTERIOVENOUS FISTULA - RESECTION OF RENDUNDANT VEIN;  Surgeon: Angelia Mould, MD;  Location: [place];  Service: Vascular;  Laterality: Left;  . SBO with lysis adhesions    . SHUNTOGRAM Left 04/19/2014   Procedure: FISTULOGRAM;  Surgeon: Angelia Mould, MD;  Location: [facility] CATH LAB;  Service: Cardiovascular;  Laterality: Left;  . UNILATERAL UPPER EXTREMEITY  ANGIOGRAM N/A 07/12/2014   Procedure: UNILATERAL UPPER Anselmo Rod;  Surgeon: Angelia Mould, MD;  Location: [facility] CATH LAB;  Service: Cardiovascular;  Laterality: N/A;    Family History  Problem Relation Age of Onset  . Aneurysm Mother        brain  . Heart disease Father   . Stroke Father   . Hypertension Father   . Diabetes Father   . Dementia Neg Hx   . Colon cancer Neg Hx   . Esophageal cancer Neg Hx   . Pancreatic cancer Neg Hx   . Prostate cancer Neg Hx   . Rectal cancer Neg Hx   . Stomach cancer Neg Hx     Social History:  reports that he quit smoking about 26 years ago. He has never used smokeless tobacco. He reports that he does not drink alcohol and does not use drugs.  Allergies:  Allergies  Allergen Reactions  . Ambien [Zolpidem Tartrate] Other (See Comments)    hallucinations and "felt crazy"   . Penicillins Rash    Has patient had a PCN reaction causing immediate rash, facial/tongue/throat swelling, SOB or lightheadedness with hypotension: Yes Has patient had a PCN reaction causing severe rash involving mucus membranes or skin necrosis: Yes Has patient had a PCN reaction that required hospitalization: No Has patient had a PCN reaction occurring within the last 10 years: No If all of the above answers are "NO", then may proceed with Cephalosporin use.     Medications: I have reviewed the patient's current medications.  Results for orders placed or performed during the hospital encounter of 11/07/20 (from the past 48 hour(s))  Comprehensive metabolic panel     Status: Abnormal   Collection Time: 11/07/20 12:00 PM  Result Value Ref Range   Sodium 140 135 - 145 mmol/L   Potassium 4.2 3.5 - 5.1 mmol/L   Chloride 101 98 - 111 mmol/L   CO2 26 22 - 32 mmol/L   Glucose, Bld 134 (H) 70 - 99 mg/dL    Comment: Glucose reference range applies only to samples taken after fasting for at least 8 hours.   BUN 49 (H) 8 - 23 mg/dL   Creatinine, Ser 10.06 (H)  0.61 - 1.24 mg/dL   Calcium 9.8 8.9 - 10.3 mg/dL   Total Protein 6.0 (L) 6.5 - 8.1 g/dL   Albumin 2.9 (L) 3.5 - 5.0 g/dL   AST 21 15 - 41 U/L   ALT 12 0 - 44 U/L   Alkaline Phosphatase 83 38 - 126 U/L   Total Bilirubin 0.6 0.3 - 1.2 mg/dL   GFR, Estimated 5 (L) >60 mL/min    Comment: (NOTE) Calculated using the CKD-EPI Creatinine Equation (2021)    Anion gap 13 5 - 15    Comment: Performed at [facility], [place] [address], [place], [place] [address]  CBC with Differential     Status: Abnormal  Collection Time: 11/07/20 12:00 PM  Result Value Ref Range   WBC 10.0 4.0 - 10.5 K/uL   RBC 2.92 (L) 4.22 - 5.81 MIL/uL   Hemoglobin 10.3 (L) 13.0 - 17.0 g/dL   HCT 30.8 (L) 39.0 - 52.0 %   MCV 105.5 (H) 80.0 - 100.0 fL   MCH 35.3 (H) 26.0 - 34.0 pg   MCHC 33.4 30.0 - 36.0 g/dL   RDW 17.2 (H) 11.5 - 15.5 %   Platelets 163 150 - 400 K/uL   nRBC 0.0 0.0 - 0.2 %   Neutrophils Relative % 67 %   Neutro Abs 6.9 1.7 - 7.7 K/uL   Lymphocytes Relative 18 %   Lymphs Abs 1.8 0.7 - 4.0 K/uL   Monocytes Relative 10 %   Monocytes Absolute 1.0 0.1 - 1.0 K/uL   Eosinophils Relative 3 %   Eosinophils Absolute 0.3 0.0 - 0.5 K/uL   Basophils Relative 1 %   Basophils Absolute 0.1 0.0 - 0.1 K/uL   Immature Granulocytes 1 %   Abs Immature Granulocytes 0.06 0.00 - 0.07 K/uL    Comment: Performed at [facility], [address], [place], [place] [address]    DG Chest 1 View  Result Date: 11/07/2020 CLINICAL DATA:  Pain following fall EXAM: CHEST  1 VIEW COMPARISON:  May 26, 2020 FINDINGS: There is slight left base atelectasis. Lungs elsewhere clear. Heart size and pulmonary vascularity are within normal limits. There is aortic atherosclerosis. No evident adenopathy. No bone lesions. IMPRESSION: Slight left base atelectasis. Lungs otherwise clear. Heart size normal. Aortic Atherosclerosis (ICD10-I70.0). Electronically Signed   By: Lowella Grip III M.D.   On: 11/07/2020 11:51   CT Head  Wo Contrast  Result Date: 11/07/2020 CLINICAL DATA:  Fall. EXAM: CT HEAD WITHOUT CONTRAST CT CERVICAL SPINE WITHOUT CONTRAST TECHNIQUE: Multidetector CT imaging of the head and cervical spine was performed following the standard protocol without intravenous contrast. Multiplanar CT image reconstructions of the cervical spine were also generated. COMPARISON:  May 26, 2020. FINDINGS: CT HEAD FINDINGS Brain: Stable bilateral frontal chronic hygromas are noted, left greater than right. Mild diffuse cortical atrophy is noted. Ventricular size is within normal limits. No evidence of acute hemorrhage, infarction or intracranial mass lesion is noted. Vascular: No hyperdense vessel or unexpected calcification. Skull: Status post bilateral frontal craniotomies. No acute osseous abnormality is noted. Sinuses/Orbits: No acute finding. Other: None. CT CERVICAL SPINE FINDINGS Alignment: Normal. Skull base and vertebrae: Old C7 fracture is noted. No acute fracture is noted. Soft tissues and spinal canal: No prevertebral fluid or swelling. No visible canal hematoma. Disc levels: Moderate degenerative disc disease is noted at C3-4, C4-5, C5-6, C6-7 and C7-T1. Upper chest: Negative. Other: None. IMPRESSION: 1. Stable bilateral frontal chronic hygromas are noted, left greater than right. Mild diffuse cortical atrophy. No acute intracranial abnormality seen. 2. Multilevel degenerative disc disease. No acute abnormality seen in the cervical spine. Electronically Signed   By: Marijo Conception M.D.   On: 11/07/2020 12:00   CT Cervical Spine Wo Contrast  Result Date: 11/07/2020 CLINICAL DATA:  Fall. EXAM: CT HEAD WITHOUT CONTRAST CT CERVICAL SPINE WITHOUT CONTRAST TECHNIQUE: Multidetector CT imaging of the head and cervical spine was performed following the standard protocol without intravenous contrast. Multiplanar CT image reconstructions of the cervical spine were also generated. COMPARISON:  May 26, 2020. FINDINGS: CT  HEAD FINDINGS Brain: Stable bilateral frontal chronic hygromas are noted, left greater than right. Mild diffuse cortical atrophy  is noted. Ventricular size is within normal limits. No evidence of acute hemorrhage, infarction or intracranial mass lesion is noted. Vascular: No hyperdense vessel or unexpected calcification. Skull: Status post bilateral frontal craniotomies. No acute osseous abnormality is noted. Sinuses/Orbits: No acute finding. Other: None. CT CERVICAL SPINE FINDINGS Alignment: Normal. Skull base and vertebrae: Old C7 fracture is noted. No acute fracture is noted. Soft tissues and spinal canal: No prevertebral fluid or swelling. No visible canal hematoma. Disc levels: Moderate degenerative disc disease is noted at C3-4, C4-5, C5-6, C6-7 and C7-T1. Upper chest: Negative. Other: None. IMPRESSION: 1. Stable bilateral frontal chronic hygromas are noted, left greater than right. Mild diffuse cortical atrophy. No acute intracranial abnormality seen. 2. Multilevel degenerative disc disease. No acute abnormality seen in the cervical spine. Electronically Signed   By: Marijo Conception M.D.   On: 11/07/2020 12:00   DG Hip Unilat W or Wo Pelvis 2-3 Views Right  Result Date: 11/07/2020 CLINICAL DATA:  Pain following fall EXAM: DG HIP (WITH OR WITHOUT PELVIS) 2-3V RIGHT COMPARISON:  None. FINDINGS: Frontal pelvis as well as frontal and lateral right hip images were obtained. There is an intertrochanteric femur fracture on the right with slight impaction at the fracture site. No other fracture. No dislocation. There is severe narrowing of the right hip joint with moderate narrowing of the left hip joint. No erosion. There is extensive distal aortic and iliac artery atherosclerotic calcification. There is also common femoral and superficial femoral artery atherosclerotic calcification bilaterally. There are surgical clips in each inguinal region. IMPRESSION: Intertrochanteric femur fracture on the right with mild  impaction at the fracture site. No other acute fracture. No dislocation. 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 There is multifocal atherosclerotic arterial vascular calcification. Electronically Signed   By: Lowella Grip III M.D.   On: 11/07/2020 11:47    Review of Systems  HENT: Negative for ear discharge, ear pain, hearing loss and tinnitus.   Eyes: Negative for photophobia and pain.  Respiratory: Negative for cough and shortness of breath.   Cardiovascular: Negative for chest pain.  Gastrointestinal: Negative for abdominal pain, nausea and vomiting.  Genitourinary: Negative for dysuria, flank pain, frequency and urgency.  Musculoskeletal: Positive for arthralgias (Right hip). Negative for back pain, myalgias and neck pain.  Neurological: Positive for syncope. Negative for dizziness and headaches.  Hematological: Does not bruise/bleed easily.  Psychiatric/Behavioral: The patient is not nervous/anxious.    Blood pressure (!) 171/44, pulse 74, temperature 98.2 F (36.8 C), temperature source Oral, resp. rate 15, SpO2 100 %. Physical Exam Constitutional:      General: He is not in acute distress.    Appearance: He is well-developed and well-nourished. He is not diaphoretic.  HENT:     Head: Normocephalic and atraumatic.  Eyes:     General: No scleral icterus.       Right eye: No discharge.        Left eye: No discharge.     Conjunctiva/sclera: Conjunctivae normal.  Cardiovascular:     Rate and Rhythm: Normal rate and regular rhythm.  Pulmonary:     Effort: Pulmonary effort is normal. No respiratory distress.  Musculoskeletal:     Cervical back: Normal range of motion.     Comments: LLE No traumatic wounds, ecchymosis, or rash  Mod TTP hip  No knee or ankle effusion  Knee stable to varus/ valgus and anterior/posterior stress  Sens DPN, SPN,  TN intact  Motor EHL, ext, flex, evers 5/5  DP 2+, PT 0, No significant edema   Skin:    General: Skin is warm and dry.  Neurological:     Mental Status: He is alert.  Psychiatric:        Mood and Affect: Mood and affect normal.        Behavior: Behavior normal.     Assessment/Plan: Right hip fx -- Plan IMN tomorrow by Dr. Lucia Gaskins. Please keep NPO after MN. Multiple medical problems including anemia, CAD, dementia, type 2 diabetes, ESRD on dialysis MWF, hypertension, PVD, and AVM of small bowel -- Have asked medicine to admit, manage, and clear. May need further workup for syncope.    Lisette Abu, PA-C Orthopedic Surgery [phone] 11/07/2020, 1:47 PM

## 2020-11-08 ENCOUNTER — Inpatient Hospital Stay (HOSPITAL_COMMUNITY): Payer: Medicare Other

## 2020-11-08 ENCOUNTER — Encounter (HOSPITAL_COMMUNITY)
Admission: EM | Disposition: A | Discharge status: Skilled Nursing Facility | Payer: Self-pay | Source: Home / Self Care | Attending: Internal Medicine

## 2020-11-08 ENCOUNTER — Inpatient Hospital Stay (HOSPITAL_COMMUNITY): Payer: Medicare Other | Admitting: Certified Registered"

## 2020-11-08 DIAGNOSIS — S72144A Nondisplaced intertrochanteric fracture of right femur, initial encounter for closed fracture: Secondary | ICD-10-CM | POA: Diagnosis not present

## 2020-11-08 HISTORY — PX: INTRAMEDULLARY (IM) NAIL INTERTROCHANTERIC: SHX5875

## 2020-11-08 LAB — BASIC METABOLIC PANEL
Anion gap: 14 (ref 5–15)
BUN: 59 mg/dL — ABNORMAL HIGH (ref 8–23)
CO2: 23 mmol/L (ref 22–32)
Calcium: 9.5 mg/dL (ref 8.9–10.3)
Chloride: 102 mmol/L (ref 98–111)
Creatinine, Ser: 10.97 mg/dL — ABNORMAL HIGH (ref 0.61–1.24)
GFR, Estimated: 4 mL/min — ABNORMAL LOW (ref >60)
Glucose, Bld: 103 mg/dL — ABNORMAL HIGH (ref 70–99)
Potassium: 5 mmol/L (ref 3.5–5.1)
Sodium: 139 mmol/L (ref 135–145)

## 2020-11-08 LAB — CBC
HCT: 26.5 % — ABNORMAL LOW (ref 39.0–52.0)
Hemoglobin: 9.1 g/dL — ABNORMAL LOW (ref 13.0–17.0)
MCH: 35.7 pg — ABNORMAL HIGH (ref 26.0–34.0)
MCHC: 34.3 g/dL (ref 30.0–36.0)
MCV: 103.9 fL — ABNORMAL HIGH (ref 80.0–100.0)
Platelets: 157 10*3/uL (ref 150–400)
RBC: 2.55 MIL/uL — ABNORMAL LOW (ref 4.22–5.81)
RDW: 17 % — ABNORMAL HIGH (ref 11.5–15.5)
WBC: 8.9 10*3/uL (ref 4.0–10.5)
nRBC: 0 % (ref 0.0–0.2)

## 2020-11-08 LAB — SURGICAL PCR SCREEN
MRSA, PCR: NEGATIVE
Staphylococcus aureus: NEGATIVE

## 2020-11-08 LAB — GLUCOSE, CAPILLARY
Glucose-Capillary: 106 mg/dL — ABNORMAL HIGH (ref 70–99)
Glucose-Capillary: 109 mg/dL — ABNORMAL HIGH (ref 70–99)
Glucose-Capillary: 111 mg/dL — ABNORMAL HIGH (ref 70–99)
Glucose-Capillary: 124 mg/dL — ABNORMAL HIGH (ref 70–99)
Glucose-Capillary: 152 mg/dL — ABNORMAL HIGH (ref 70–99)
Glucose-Capillary: 90 mg/dL (ref 70–99)

## 2020-11-08 LAB — HEMOGLOBIN A1C
Hgb A1c MFr Bld: 4.9 % (ref 4.8–5.6)
Mean Plasma Glucose: 94 mg/dL

## 2020-11-08 SURGERY — FIXATION, FRACTURE, INTERTROCHANTERIC, WITH INTRAMEDULLARY ROD
Anesthesia: General | Laterality: Right

## 2020-11-08 MED ORDER — PROPOFOL 10 MG/ML IV BOLUS
INTRAVENOUS | Status: DC | PRN
  Administered 2020-11-08: 100 mg via INTRAVENOUS

## 2020-11-08 MED ORDER — VANCOMYCIN HCL IN DEXTROSE 1-5 GM/200ML-% IV SOLN: 1000.0000 mg | INTRAVENOUS | Status: AC

## 2020-11-08 MED ORDER — ACETAMINOPHEN 500 MG PO TABS
ORAL_TABLET | ORAL | Status: AC
  Administered 2020-11-08: 1000 mg via ORAL
  Filled 2020-11-08: qty 2

## 2020-11-08 MED ORDER — CHLORHEXIDINE GLUCONATE 0.12 % MT SOLN: 15.0000 mL | Freq: Once | OROMUCOSAL | Status: AC

## 2020-11-08 MED ORDER — METOCLOPRAMIDE HCL 5 MG/ML IJ SOLN: 5.0000 mg | Freq: Three times a day (TID) | INTRAMUSCULAR | Status: DC | PRN

## 2020-11-08 MED ORDER — CHLORHEXIDINE GLUCONATE 4 % EX LIQD
60.0000 mL | Freq: Once | CUTANEOUS | Status: DC
  Administered 2020-11-08: 4 via TOPICAL

## 2020-11-08 MED ORDER — LIDOCAINE HCL (PF) 1 % IJ SOLN
5.0000 mL | INTRAMUSCULAR | Status: DC | PRN
  Filled 2020-11-08: qty 5

## 2020-11-08 MED ORDER — ONDANSETRON HCL 4 MG PO TABS: 4.0000 mg | ORAL_TABLET | Freq: Four times a day (QID) | ORAL | Status: DC | PRN

## 2020-11-08 MED ORDER — VANCOMYCIN HCL 1000 MG IV SOLR
INTRAVENOUS | Status: DC | PRN
  Administered 2020-11-08: 1000 mg via INTRAVENOUS

## 2020-11-08 MED ORDER — DOCUSATE SODIUM 100 MG PO CAPS: 100.0000 mg | ORAL_CAPSULE | Freq: Two times a day (BID) | ORAL | Status: DC

## 2020-11-08 MED ORDER — RENA-VITE PO TABS
1.0000 | ORAL_TABLET | Freq: Every day | ORAL | Status: DC
  Administered 2020-11-08 – 2020-11-15 (×8): 1 via ORAL
  Filled 2020-11-08 (×8): qty 1

## 2020-11-08 MED ORDER — VANCOMYCIN HCL IN DEXTROSE 1-5 GM/200ML-% IV SOLN
INTRAVENOUS | Status: AC
  Administered 2020-11-08: 1000 mg via INTRAVENOUS
  Filled 2020-11-08: qty 200

## 2020-11-08 MED ORDER — ONDANSETRON HCL 4 MG/2ML IJ SOLN
4.0000 mg | Freq: Four times a day (QID) | INTRAMUSCULAR | Status: DC | PRN
  Administered 2020-11-14: 4 mg via INTRAVENOUS
  Filled 2020-11-08: qty 2

## 2020-11-08 MED ORDER — DEXAMETHASONE SODIUM PHOSPHATE 10 MG/ML IJ SOLN
INTRAMUSCULAR | Status: DC | PRN
  Administered 2020-11-08: 5 mg via INTRAVENOUS

## 2020-11-08 MED ORDER — SODIUM CHLORIDE 0.9 % IV SOLN: INTRAVENOUS | Status: DC

## 2020-11-08 MED ORDER — METOPROLOL TARTRATE 5 MG/5ML IV SOLN
INTRAVENOUS | Status: AC
  Filled 2020-11-08: qty 5

## 2020-11-08 MED ORDER — SUGAMMADEX SODIUM 200 MG/2ML IV SOLN
INTRAVENOUS | Status: DC | PRN
  Administered 2020-11-08: 200 mg via INTRAVENOUS

## 2020-11-08 MED ORDER — ENOXAPARIN SODIUM 30 MG/0.3ML ~~LOC~~ SOLN: 30.0000 mg | SUBCUTANEOUS | Status: DC

## 2020-11-08 MED ORDER — PHENYLEPHRINE HCL-NACL 10-0.9 MG/250ML-% IV SOLN
INTRAVENOUS | Status: DC | PRN
  Administered 2020-11-08: 50 ug/min via INTRAVENOUS

## 2020-11-08 MED ORDER — PHENYLEPHRINE 40 MCG/ML (10ML) SYRINGE FOR IV PUSH (FOR BLOOD PRESSURE SUPPORT)
PREFILLED_SYRINGE | INTRAVENOUS | Status: DC | PRN
  Administered 2020-11-08: 120 ug via INTRAVENOUS
  Administered 2020-11-08: 80 ug via INTRAVENOUS
  Administered 2020-11-08: 120 ug via INTRAVENOUS

## 2020-11-08 MED ORDER — PENTAFLUOROPROP-TETRAFLUOROETH EX AERO: 1.0000 "application " | INHALATION_SPRAY | CUTANEOUS | Status: DC | PRN

## 2020-11-08 MED ORDER — FENTANYL CITRATE (PF) 100 MCG/2ML IJ SOLN
25.0000 ug | INTRAMUSCULAR | Status: DC | PRN
Start: 2020-11-08 — End: 2020-11-08
  Administered 2020-11-08 (×2): 25 ug via INTRAVENOUS

## 2020-11-08 MED ORDER — HEPARIN SODIUM (PORCINE) 5000 UNIT/ML IJ SOLN
5000.0000 [IU] | Freq: Three times a day (TID) | INTRAMUSCULAR | Status: DC
  Administered 2020-11-09 – 2020-11-15 (×20): 5000 [IU] via SUBCUTANEOUS
  Filled 2020-11-08 (×20): qty 1

## 2020-11-08 MED ORDER — CHLORHEXIDINE GLUCONATE 0.12 % MT SOLN
OROMUCOSAL | Status: AC
  Administered 2020-11-08: 15 mL via OROMUCOSAL
  Filled 2020-11-08: qty 15

## 2020-11-08 MED ORDER — VANCOMYCIN HCL 1000 MG/200ML IV SOLN: 1000.0000 mg | Freq: Two times a day (BID) | INTRAVENOUS | Status: DC

## 2020-11-08 MED ORDER — SODIUM CHLORIDE 0.9 % IV SOLN: 100.0000 mL | INTRAVENOUS | Status: DC | PRN

## 2020-11-08 MED ORDER — ONDANSETRON HCL 4 MG/2ML IJ SOLN
INTRAMUSCULAR | Status: DC | PRN
  Administered 2020-11-08: 4 mg via INTRAVENOUS

## 2020-11-08 MED ORDER — ROCURONIUM BROMIDE 10 MG/ML (PF) SYRINGE
PREFILLED_SYRINGE | INTRAVENOUS | Status: DC | PRN
  Administered 2020-11-08: 40 mg via INTRAVENOUS
  Administered 2020-11-08: 30 mg via INTRAVENOUS

## 2020-11-08 MED ORDER — ACETAMINOPHEN 500 MG PO TABS: 1000.0000 mg | ORAL_TABLET | Freq: Once | ORAL | Status: AC

## 2020-11-08 MED ORDER — 0.9 % SODIUM CHLORIDE (POUR BTL) OPTIME
TOPICAL | Status: DC | PRN
  Administered 2020-11-08: 1000 mL

## 2020-11-08 MED ORDER — ALTEPLASE 2 MG IJ SOLR
2.0000 mg | Freq: Once | INTRAMUSCULAR | Status: DC | PRN
  Filled 2020-11-08: qty 2

## 2020-11-08 MED ORDER — VANCOMYCIN HCL IN DEXTROSE 750-5 MG/150ML-% IV SOLN
750.0000 mg | INTRAVENOUS | Status: DC
  Filled 2020-11-08: qty 150

## 2020-11-08 MED ORDER — FENTANYL CITRATE (PF) 250 MCG/5ML IJ SOLN
INTRAMUSCULAR | Status: AC
  Filled 2020-11-08: qty 5

## 2020-11-08 MED ORDER — LIDOCAINE-PRILOCAINE 2.5-2.5 % EX CREA
1.0000 "application " | TOPICAL_CREAM | CUTANEOUS | Status: DC | PRN
  Filled 2020-11-08: qty 5

## 2020-11-08 MED ORDER — METOCLOPRAMIDE HCL 5 MG PO TABS: 5.0000 mg | ORAL_TABLET | Freq: Three times a day (TID) | ORAL | Status: DC | PRN

## 2020-11-08 MED ORDER — METOPROLOL TARTRATE 5 MG/5ML IV SOLN
5.0000 mg | Freq: Once | INTRAVENOUS | Status: AC
  Administered 2020-11-08: 5 mg via INTRAVENOUS

## 2020-11-08 MED ORDER — LIDOCAINE 2% (20 MG/ML) 5 ML SYRINGE
INTRAMUSCULAR | Status: DC | PRN
  Administered 2020-11-08: 60 mg via INTRAVENOUS

## 2020-11-08 MED ORDER — FENTANYL CITRATE (PF) 100 MCG/2ML IJ SOLN
INTRAMUSCULAR | Status: AC
  Filled 2020-11-08: qty 2

## 2020-11-08 MED ORDER — PHENOL 1.4 % MT LIQD: 1.0000 | OROMUCOSAL | Status: DC | PRN

## 2020-11-08 MED ORDER — HEPARIN SODIUM (PORCINE) 1000 UNIT/ML DIALYSIS
1000.0000 [IU] | INTRAMUSCULAR | Status: DC | PRN
  Filled 2020-11-08: qty 1

## 2020-11-08 MED ORDER — MENTHOL 3 MG MT LOZG: 1.0000 | LOZENGE | OROMUCOSAL | Status: DC | PRN

## 2020-11-08 MED ORDER — FENTANYL CITRATE (PF) 250 MCG/5ML IJ SOLN
INTRAMUSCULAR | Status: DC | PRN
  Administered 2020-11-08: 50 ug via INTRAVENOUS
  Administered 2020-11-08: 25 ug via INTRAVENOUS
  Administered 2020-11-08: 50 ug via INTRAVENOUS
  Administered 2020-11-08: 25 ug via INTRAVENOUS

## 2020-11-08 SURGICAL SUPPLY — 53 items
APL PRP STRL LF DISP 70% ISPRP (MISCELLANEOUS) ×1
BIT DRILL 4.0X280 (BIT) ×1 IMPLANT
BLADE SURG 10 STRL SS (BLADE) ×4 IMPLANT
BNDG COHESIVE 4X5 TAN STRL (GAUZE/BANDAGES/DRESSINGS) ×2 IMPLANT
BNDG COHESIVE 6X5 TAN STRL LF (GAUZE/BANDAGES/DRESSINGS) ×4 IMPLANT
BNDG ELASTIC 4X5.8 VLCR STR LF (GAUZE/BANDAGES/DRESSINGS) ×2 IMPLANT
BNDG ELASTIC 6X5.8 VLCR STR LF (GAUZE/BANDAGES/DRESSINGS) ×2 IMPLANT
BRUSH SCRUB EZ PLAIN DRY (MISCELLANEOUS) ×4 IMPLANT
CHLORAPREP W/TINT 26 (MISCELLANEOUS) ×2 IMPLANT
COVER MAYO STAND STRL (DRAPES) ×2 IMPLANT
COVER SURGICAL LIGHT HANDLE (MISCELLANEOUS) ×2 IMPLANT
COVER WAND RF STERILE (DRAPES) ×2 IMPLANT
DRAPE C-ARM 35X43 STRL (DRAPES) ×2 IMPLANT
DRAPE C-ARMOR (DRAPES) ×2 IMPLANT
DRAPE HALF SHEET 40X57 (DRAPES) ×4 IMPLANT
DRAPE IMP U-DRAPE 54X76 (DRAPES) ×4 IMPLANT
DRAPE INCISE IOBAN 66X45 STRL (DRAPES) ×2 IMPLANT
DRAPE ORTHO SPLIT 77X108 STRL (DRAPES) ×4
DRAPE SURG 17X23 STRL (DRAPES) ×2 IMPLANT
DRAPE SURG ORHT 6 SPLT 77X108 (DRAPES) ×2 IMPLANT
DRAPE U-SHAPE 47X51 STRL (DRAPES) ×2 IMPLANT
DRSG MEPILEX BORDER 4X4 (GAUZE/BANDAGES/DRESSINGS) ×6 IMPLANT
DRSG MEPILEX BORDER 4X8 (GAUZE/BANDAGES/DRESSINGS) ×2 IMPLANT
ELECT REM PT RETURN 9FT ADLT (ELECTROSURGICAL) ×2
ELECTRODE REM PT RTRN 9FT ADLT (ELECTROSURGICAL) ×1 IMPLANT
GLOVE BIOGEL M STRL SZ7.5 (GLOVE) ×2 IMPLANT
GLOVE SRG 8 PF TXTR STRL LF DI (GLOVE) ×1 IMPLANT
GLOVE SURG UNDER POLY LF SZ8 (GLOVE) ×2
GOWN STRL REUS W/ TWL LRG LVL3 (GOWN DISPOSABLE) ×1 IMPLANT
GOWN STRL REUS W/ TWL XL LVL3 (GOWN DISPOSABLE) ×1 IMPLANT
GOWN STRL REUS W/TWL LRG LVL3 (GOWN DISPOSABLE) ×2
GOWN STRL REUS W/TWL XL LVL3 (GOWN DISPOSABLE) ×2
GUIDEWIRE BALL NOSE 3.0X900 (WIRE) ×2
GUIDEWIRE ORTH 900X3XBALL NOSE (WIRE) IMPLANT
KIT BASIN OR (CUSTOM PROCEDURE TRAY) ×2 IMPLANT
KIT TURNOVER KIT B (KITS) ×2 IMPLANT
MANIFOLD NEPTUNE II (INSTRUMENTS) ×2 IMPLANT
NAIL FEM IM 11X200 130D (Nail) ×1 IMPLANT
NS IRRIG 1000ML POUR BTL (IV SOLUTION) ×2 IMPLANT
PACK GENERAL/GYN (CUSTOM PROCEDURE TRAY) ×2 IMPLANT
PAD ARMBOARD 7.5X6 YLW CONV (MISCELLANEOUS) ×4 IMPLANT
PIN GUIDE THRD AR 3.2X330 (PIN) ×1 IMPLANT
SCREW LAG GALILEO 10.5X105 (Screw) ×1 IMPLANT
SCREW LOCK CORT 5.0X40 (Screw) ×1 IMPLANT
STAPLER VISISTAT (STAPLE) ×1 IMPLANT
STAPLER VISISTAT 35W (STAPLE) ×2 IMPLANT
STOCKINETTE IMPERVIOUS LG (DRAPES) ×2 IMPLANT
SUT MON AB 2-0 CT1 36 (SUTURE) ×1 IMPLANT
TOOL ACTIVATION (INSTRUMENTS) ×1 IMPLANT
TOWEL GREEN STERILE (TOWEL DISPOSABLE) ×4 IMPLANT
TOWEL GREEN STERILE FF (TOWEL DISPOSABLE) ×2 IMPLANT
UNDERPAD 30X36 HEAVY ABSORB (UNDERPADS AND DIAPERS) ×2 IMPLANT
WATER STERILE IRR 1000ML POUR (IV SOLUTION) ×2 IMPLANT

## 2020-11-08 NOTE — Progress Notes (Signed)
Report given to Dialysis nurse- patient is ready for dialysis

## 2020-11-08 NOTE — Progress Notes (Addendum)
Pt converted to Afib during HD. HR 150-160s.  Dr. Tana Coast notified and Lopressor ordered and given. EKG ordered as well. After medication administered HR 120-130s. Dr. Tana Coast aware.

## 2020-11-08 NOTE — Progress Notes (Signed)
Pt confused bent arm that has IV in it. IV alarm went off. HD RN checked IV and looks to be out. Notified. Primary RN. Turned KVO off.

## 2020-11-08 NOTE — Anesthesia Preprocedure Evaluation (Addendum)
Anesthesia Evaluation  Patient identified by MRN, date of birth, ID band Patient awake    Reviewed: Allergy & Precautions, NPO status , Patient's Chart, lab work & pertinent test results  Airway Mallampati: II  TM Distance: >3 FB Neck ROM: Full    Dental  (+) Dental Advisory Given   Pulmonary sleep apnea , former smoker,    breath sounds clear to auscultation       Cardiovascular hypertension, Pt. on medications + CAD and + Peripheral Vascular Disease   Rhythm:Regular Rate:Normal     Neuro/Psych negative neurological ROS     GI/Hepatic Neg liver ROS, hiatal hernia, GERD  ,  Endo/Other  diabetes, Type 2, Insulin DependentHypothyroidism   Renal/GU ESRF and DialysisRenal disease     Musculoskeletal  (+) Arthritis ,   Abdominal   Peds  Hematology  (+) anemia ,   Anesthesia Other Findings   Reproductive/Obstetrics                             Anesthesia Physical Anesthesia Plan  ASA: III  Anesthesia Plan: General   Post-op Pain Management:    Induction: Intravenous  PONV Risk Score and Plan: 2 and Dexamethasone, Ondansetron and Treatment may vary due to age or medical condition  Airway Management Planned: Oral ETT  Additional Equipment: None  Intra-op Plan:   Post-operative Plan: Extubation in OR  Informed Consent: I have reviewed the patients History and Physical, chart, labs and discussed the procedure including the risks, benefits and alternatives for the proposed anesthesia with the patient or authorized representative who has indicated his/her understanding and acceptance.     Dental advisory given  Plan Discussed with: CRNA  Anesthesia Plan Comments:         Anesthesia Quick Evaluation

## 2020-11-08 NOTE — Progress Notes (Signed)
Initial Nutrition Assessment  DOCUMENTATION CODES:   Not applicable  INTERVENTION:   -Once diet is advanced, add:   -Nepro Shake po TID, each supplement provides 425 kcal and 19 grams protein -Renal MVI daily  NUTRITION DIAGNOSIS:   Increased nutrient needs related to post-op healing as evidenced by estimated needs.  GOAL:   Patient will meet greater than or equal to 90% of their needs  MONITOR:   PO intake,Supplement acceptance,Diet advancement,Labs,Weight trends,Skin,I & O's  REASON FOR ASSESSMENT:   Consult Assessment of nutrition requirement/status,Hip fracture protocol  ASSESSMENT:   [name] is a 80 y.o. male with medical history significant of ESRD on HD, HTN, IDDM, diabetic neuropathy, hypothyroidism, gout, chronic ambulation dysfunction, presented with mechanical fall and fracture.  Pt admitted with rt hip fracture s/p mechanical fall.   2/15- s/p PROCEDURE: Cephalomedullary nailing of right intertrochanteric hip fracture Intraoperative use of fluoroscopy  Pt unavailable at time of visit.   Plan for IMN today. Pt currently NPO for procedure.   No recent wt available to assess at this time (last documented wt taken on 08/11/20. Per nephrology notes, EDW 74 kg.   Pt with increased nutritional needs for post-operative healing and would benefit from addition of oral nutrition supplements.   Medications reviewed and include colace, renvela, and 0.9% sodium chloride infusion @ 10 ml/hr.   Lab Results  Component Value Date   HGBA1C 5.3 03/31/2020   PTA DM medications are 6-8 units insulin aspart TID with meals.   Labs reviewed: CBGS: 90-111 (inpatient orders for glycemic control are 5 mg linagliptin daily and 0-6 units insulin aspart TID with meals).   Diet Order:   Diet Order            Diet renal/carb modified with fluid restriction Diet-HS Snack? Nothing; Fluid restriction: 1200 mL Fluid; Room service appropriate? Yes; Fluid consistency: Thin   Diet effective now                 EDUCATION NEEDS:   No education needs have been identified at this time  Skin:  Skin Assessment: Skin Integrity Issues: Skin Integrity Issues:: Incisions Incisions: closed rt hip  Last BM:  11/04/20  Height:   Ht Readings from Last 1 Encounters:  08/11/20 5\' 11"  (1.803 m)    Weight:   Wt Readings from Last 1 Encounters:  08/11/20 74.5 kg    Ideal Body Weight:  78.2 kg  BMI:  There is no height or weight on file to calculate BMI.  Estimated Nutritional Needs:   Kcal:  2200-2400  Protein:  110-125 grams  Fluid:  1000 ml + UOP    Loistine Chance, RD, LDN, [place] Registered Dietitian II Certified Diabetes Care and Education Specialist Please refer to [facility] for RD and/or RD on-call/weekend/after hours pager

## 2020-11-08 NOTE — Progress Notes (Addendum)
[facility]                                                                              Patient Demographics  [name], is a 80 y.o. male, DOB - 1941-03-27, JTT:[id number]  Admit date - 11/07/2020   Admitting Physician Lequita Halt, MD  Outpatient Primary MD for the patient is Baxley, Cresenciano Lick, MD  Outpatient specialists:   LOS - 1  days   Medical records reviewed and are as summarized below:    Chief Complaint  Patient presents with  . Fall  . Hip Pain       Brief summary   Patient is a 80 year old male with history of ESRD on HD, MWF, hypertension, IDDM, diabetic neuropathy, hypothyroidism, gout presented with mechanical fall.  Patient fell 2 nights ago prior to admission, described as mechanical fall.  Denied any loss of consciousness, no prodrome of lightheadedness or weakness.  He was unable to get up himself.  Patient reported right leg pain and unable to bear weight on the right leg.  Also reported new onset of exertional dyspnea for about a year and gradually worsening. Imaging showed right intertrochanteric fracture.  Orthopedics was consulted, Dr Lucia Gaskins.  Patient was admitted for further work-up.   Assessment & Plan    Principal Problem:   Closed nondisplaced intertrochanteric fracture of right femur (HCC) -Status post mechanical fall, right hip x-ray showed intertrochanteric femur fracture on the right with mild impaction at the fracture site -Orthopedics consulted, n.p.o., plan for OR today -PT OT evaluation after surgery -Pain control, DVT prophylaxis per orthopedics  Active Problems: Dyspnea on exertion, chronic -Concern for possible CHF, patient is ESRD on hemodialysis, volume control with dialysis -Seen by cardiology, Dr. Gwenlyn Found, no chest pain, recommended 2D echo.  If EF is normal, no further work-up and outpatient follow-up with PCP -2D echo showed EF of 55 to 60% with no regional wall motion abnormalities, G1 DD  ESRD on  hemodialysis MWF -Nephrology has been consulted, continue hemodialysis per schedule  Anemia of chronic disease, ESRD -Hemoglobin 9.1, ESA with HD, nephrology following - will follow H&H closely, postop may need packed RBC transfusion    Hyperlipidemia -Continue Zocor    Essential hypertension -Placed on IV hydralazine as needed with parameters    Hypothyroidism -Continue Synthroid    Diabetes mellitus type 2 in nonobese ([place]), on long-term insulin/IDDM -Follow hemoglobin A1c, continue sliding scale insulin while inpatient  History of dementia -Currently stable, alert and oriented, - continue Aricept and Namenda  Addendum: 6:45pm Called by HD department, tachycardia 150, BP 134/96 - ordered stat EKG to r/o Afib, lopressor 5mg  IV x1  - No prior history of Afib in records.  - cardiology following, echo done today with preserved EF 55- 60%  Code Status: Partial DVT Prophylaxis:  SCDs Start: 11/07/20 1442   Level of Care: Level of care: Med-Surg Family Communication: Discussed all imaging results, lab results, explained to the patient's wife, Mrs. Ab Leaming, phone number [phone]   Disposition Plan:     Status is: Inpatient  Remains inpatient appropriate because:Inpatient level of care appropriate due to severity of  illness   Dispo: The patient is from: Home              Anticipated d/c is to: TBD              Anticipated d/c date is: > 3 days              Patient currently is not medically stable to d/c.   Difficult to place patient No   Time Spent in minutes     Procedures:    Consultants:   Orthopedics Cardiology  Antimicrobials:   Anti-infectives (From admission, onward)   Start     Dose/Rate Route Frequency Ordered Stop   11/08/20 0930  vancomycin (VANCOCIN) IVPB 1000 mg/200 mL premix        1,000 mg 200 mL/hr over 60 Minutes Intravenous On call to O.R. 11/08/20 0920 11/08/20 1033         Medications  Scheduled Meds: . [MAR Hold]  allopurinol  100 mg Oral Daily  . chlorhexidine  60 mL Topical Once  . [MAR Hold] Chlorhexidine Gluconate Cloth  6 each Topical Q0600  . [MAR Hold] docusate sodium  100 mg Oral BID  . [MAR Hold] donepezil  10 mg Oral QHS  . [MAR Hold] feeding supplement (NEPRO CARB STEADY)  237 mL Oral TID WC  . [MAR Hold] fluticasone  1 spray Each Nare Daily  . [MAR Hold] gabapentin  100 mg Oral QHS  . [MAR Hold] insulin aspart  0-6 Units Subcutaneous TID WC  . [MAR Hold] levothyroxine  50 mcg Oral QAC breakfast  . [MAR Hold] linagliptin  5 mg Oral Daily  . [MAR Hold] loratadine  10 mg Oral Daily  . [MAR Hold] memantine  10 mg Oral BID  . [MAR Hold] midodrine  10 mg Oral Q M,W,F-HD  . [MAR Hold] pantoprazole  40 mg Oral Daily  . [MAR Hold] sevelamer carbonate  2,400 mg Oral 3 times per day on Sun Tue Thu Sat  . [MAR Hold] sevelamer carbonate  2,400 mg Oral 2 times per day on Mon Wed Fri  . [MAR Hold] simvastatin  20 mg Oral QHS   Continuous Infusions: . sodium chloride 10 mL/hr at 11/08/20 1013   PRN Meds:.[MAR Hold] acetaminophen, [MAR Hold] albuterol, [MAR Hold] hydrALAZINE, [MAR Hold] HYDROcodone-acetaminophen, [MAR Hold]  HYDROmorphone (DILAUDID) injection, [MAR Hold] lidocaine, [MAR Hold] sevelamer carbonate      Subjective:   Kreston Ahrendt was seen and examined today.  Patient seen this morning, prior to the surgery, no acute complaints except pain in the right hip, worse on movement.  No chest pain or any worsening shortness of breath.  No fevers or chills. Patient denies dizziness lightheadedness, abdominal pain any nausea vomiting.  Objective:   Vitals:   11/07/20 2000 11/08/20 0100 11/08/20 0445 11/08/20 0749  BP: (!) 167/71 (!) 165/61 (!) 154/58 (!) 156/53  Pulse: 77 75 71 74  Resp: 16   17  Temp: 98.6 F (37 C) 99.3 F (37.4 C) 98.6 F (37 C) 99.9 F (37.7 C)  TempSrc: Oral Oral Oral Oral  SpO2: 97% 100% 100% 98%   No intake or output data in the 24 hours ending 11/08/20  1129   Wt Readings from Last 3 Encounters:  08/11/20 74.5 kg  08/04/20 75.1 kg  07/28/20 75.8 kg     Exam  General: Alert and oriented x 3, NAD  Cardiovascular: S1 S2 auscultated, no murmurs, RRR  Respiratory: Decreased breath sound at the bases  Gastrointestinal: Soft, nontender, nondistended, + bowel sounds  Ext: no pedal edema bilaterally  Neuro: exam limited due to pain  Musculoskeletal: No digital cyanosis, clubbing  Skin: No rashes  Psych: Normal affect and demeanor   Data Reviewed:  I have personally reviewed following labs and imaging studies  Micro Results Recent Results (from the past 240 hour(s))  Resp Panel by RT-PCR (Flu A&B, Covid) Nasopharyngeal Swab     Status: None   Collection Time: 11/07/20  1:38 PM   Specimen: Nasopharyngeal Swab; Nasopharyngeal(NP) swabs in vial transport medium  Result Value Ref Range Status   SARS Coronavirus 2 by RT PCR NEGATIVE NEGATIVE Final    Comment: (NOTE) SARS-CoV-2 target nucleic acids are NOT DETECTED.  The SARS-CoV-2 RNA is generally detectable in upper respiratory specimens during the acute phase of infection. The lowest concentration of SARS-CoV-2 viral copies this assay can detect is 138 copies/mL. A negative result does not preclude SARS-Cov-2 infection and should not be used as the sole basis for treatment or other patient management decisions. A negative result may occur with  improper specimen collection/handling, submission of specimen other than nasopharyngeal swab, presence of viral mutation(s) within the areas targeted by this assay, and inadequate number of viral copies(<138 copies/mL). A negative result must be combined with clinical observations, patient history, and epidemiological information. The expected result is Negative.  Fact Sheet for Patients:  EntrepreneurPulse.com.au  Fact Sheet for Healthcare Providers:  IncredibleEmployment.be  This test is no  t yet approved or cleared by the [place] FDA and  has been authorized for detection and/or diagnosis of SARS-CoV-2 by FDA under an Emergency Use Authorization (EUA). This EUA will remain  in effect (meaning this test can be used) for the duration of the COVID-19 declaration under Section 564(b)(1) of the Act, 21 U.S.C.section 360bbb-3(b)(1), unless the authorization is terminated  or revoked sooner.       Influenza A by PCR NEGATIVE NEGATIVE Final   Influenza B by PCR NEGATIVE NEGATIVE Final    Comment: (NOTE) The Xpert Xpress SARS-CoV-2/FLU/RSV plus assay is intended as an aid in the diagnosis of influenza from Nasopharyngeal swab specimens and should not be used as a sole basis for treatment. Nasal washings and aspirates are unacceptable for Xpert Xpress SARS-CoV-2/FLU/RSV testing.  Fact Sheet for Patients: EntrepreneurPulse.com.au  Fact Sheet for Healthcare Providers: IncredibleEmployment.be  This test is not yet approved or cleared by the [place] FDA and has been authorized for detection and/or diagnosis of SARS-CoV-2 by FDA under an Emergency Use Authorization (EUA). This EUA will remain in effect (meaning this test can be used) for the duration of the COVID-19 declaration under Section 564(b)(1) of the Act, 21 U.S.C. section 360bbb-3(b)(1), unless the authorization is terminated or revoked.  Performed at [facility], [place] [address], [place], [place] [address]   Surgical pcr screen     Status: None   Collection Time: 11/08/20 12:13 AM   Specimen: Nasal Mucosa; Nasal Swab  Result Value Ref Range Status   MRSA, PCR NEGATIVE NEGATIVE Final   Staphylococcus aureus NEGATIVE NEGATIVE Final    Comment: (NOTE) The Xpert SA Assay (FDA approved for NASAL specimens in patients 52 years of age and older), is one component of a comprehensive surveillance program. It is not intended to diagnose infection nor to guide or  monitor treatment. Performed at [facility], [place] [address] [place], [place] [address]     Radiology Reports DG Chest 1 View  Result Date:  11/07/2020 CLINICAL DATA:  Pain following fall EXAM: CHEST  1 VIEW COMPARISON:  May 26, 2020 FINDINGS: There is slight left base atelectasis. Lungs elsewhere clear. Heart size and pulmonary vascularity are within normal limits. There is aortic atherosclerosis. No evident adenopathy. No bone lesions. IMPRESSION: Slight left base atelectasis. Lungs otherwise clear. Heart size normal. Aortic Atherosclerosis (ICD10-I70.0). Electronically Signed   By: Lowella Grip III M.D.   On: 11/07/2020 11:51   CT Head Wo Contrast  Result Date: 11/07/2020 CLINICAL DATA:  Fall. EXAM: CT HEAD WITHOUT CONTRAST CT CERVICAL SPINE WITHOUT CONTRAST TECHNIQUE: Multidetector CT imaging of the head and cervical spine was performed following the standard protocol without intravenous contrast. Multiplanar CT image reconstructions of the cervical spine were also generated. COMPARISON:  May 26, 2020. FINDINGS: CT HEAD FINDINGS Brain: Stable bilateral frontal chronic hygromas are noted, left greater than right. Mild diffuse cortical atrophy is noted. Ventricular size is within normal limits. No evidence of acute hemorrhage, infarction or intracranial mass lesion is noted. Vascular: No hyperdense vessel or unexpected calcification. Skull: Status post bilateral frontal craniotomies. No acute osseous abnormality is noted. Sinuses/Orbits: No acute finding. Other: None. CT CERVICAL SPINE FINDINGS Alignment: Normal. Skull base and vertebrae: Old C7 fracture is noted. No acute fracture is noted. Soft tissues and spinal canal: No prevertebral fluid or swelling. No visible canal hematoma. Disc levels: Moderate degenerative disc disease is noted at C3-4, C4-5, C5-6, C6-7 and C7-T1. Upper chest: Negative. Other: None. IMPRESSION: 1. Stable bilateral frontal chronic hygromas are noted,  left greater than right. Mild diffuse cortical atrophy. No acute intracranial abnormality seen. 2. Multilevel degenerative disc disease. No acute abnormality seen in the cervical spine. Electronically Signed   By: Marijo Conception M.D.   On: 11/07/2020 12:00   CT Cervical Spine Wo Contrast  Result Date: 11/07/2020 CLINICAL DATA:  Fall. EXAM: CT HEAD WITHOUT CONTRAST CT CERVICAL SPINE WITHOUT CONTRAST TECHNIQUE: Multidetector CT imaging of the head and cervical spine was performed following the standard protocol without intravenous contrast. Multiplanar CT image reconstructions of the cervical spine were also generated. COMPARISON:  May 26, 2020. FINDINGS: CT HEAD FINDINGS Brain: Stable bilateral frontal chronic hygromas are noted, left greater than right. Mild diffuse cortical atrophy is noted. Ventricular size is within normal limits. No evidence of acute hemorrhage, infarction or intracranial mass lesion is noted. Vascular: No hyperdense vessel or unexpected calcification. Skull: Status post bilateral frontal craniotomies. No acute osseous abnormality is noted. Sinuses/Orbits: No acute finding. Other: None. CT CERVICAL SPINE FINDINGS Alignment: Normal. Skull base and vertebrae: Old C7 fracture is noted. No acute fracture is noted. Soft tissues and spinal canal: No prevertebral fluid or swelling. No visible canal hematoma. Disc levels: Moderate degenerative disc disease is noted at C3-4, C4-5, C5-6, C6-7 and C7-T1. Upper chest: Negative. Other: None. IMPRESSION: 1. Stable bilateral frontal chronic hygromas are noted, left greater than right. Mild diffuse cortical atrophy. No acute intracranial abnormality seen. 2. Multilevel degenerative disc disease. No acute abnormality seen in the cervical spine. Electronically Signed   By: Marijo Conception M.D.   On: 11/07/2020 12:00   ECHOCARDIOGRAM COMPLETE  Result Date: 11/07/2020    ECHOCARDIOGRAM REPORT   Patient Name:   ABELARDO SEIDNER Semrad Date of Exam: 11/07/2020  Medical Rec #:  [id number]      Height:       71.0 in Accession #:    [id number]     Weight:       164.2 lb Date of  Birth:  Nov 21, 1940      BSA:          1.939 m Patient Age:    72 years       BP:           156/59 mmHg Patient Gender: M              HR:           73 bpm. Exam Location:  Inpatient Procedure: 2D Echo, Cardiac Doppler and Color Doppler Indications:    CHF-Acute Diastolic W11.91  History:        Patient has prior history of Echocardiogram examinations, most                 recent 04/29/2018. CAD, PAD, Signs/Symptoms:Dyspnea; Risk                 Factors:Diabetes, Hypertension, Dyslipidemia and Sleep Apnea.                 Thyroid disease. Renal insufficiency. End stage renal disease.                 Mild dementia. Hip fracture.  Sonographer:    Darlina Sicilian RDCS Referring Phys: [id number] [place]  1. Left ventricular ejection fraction, by estimation, is 55 to 60%. The left ventricle has normal function. The left ventricle has no regional wall motion abnormalities. There is mild left ventricular hypertrophy. Left ventricular diastolic parameters are consistent with Grade I diastolic dysfunction (impaired relaxation).  2. Right ventricular systolic function is normal. The right ventricular size is normal. There is mildly elevated pulmonary artery systolic pressure. The estimated right ventricular systolic pressure is 21.3 mmHg.  3. The mitral valve is normal in structure. Trivial mitral valve regurgitation. No evidence of mitral stenosis.  4. The aortic valve is tricuspid. Aortic valve regurgitation is not visualized. Mild aortic valve sclerosis is present, with no evidence of aortic valve stenosis.  5. The inferior vena cava is normal in size with greater than 50% respiratory variability, suggesting right atrial pressure of 3 mmHg. FINDINGS  Left Ventricle: Left ventricular ejection fraction, by estimation, is 55 to 60%. The left ventricle has normal function. The left ventricle has no  regional wall motion abnormalities. The left ventricular internal cavity size was normal in size. There is  mild left ventricular hypertrophy. Left ventricular diastolic parameters are consistent with Grade I diastolic dysfunction (impaired relaxation). Right Ventricle: The right ventricular size is normal. No increase in right ventricular wall thickness. Right ventricular systolic function is normal. There is mildly elevated pulmonary artery systolic pressure. The tricuspid regurgitant velocity is 3.05  m/s, and with an assumed right atrial pressure of 3 mmHg, the estimated right ventricular systolic pressure is 08.6 mmHg. Left Atrium: Left atrial size was normal in size. Right Atrium: Right atrial size was normal in size. Pericardium: There is no evidence of pericardial effusion. Mitral Valve: The mitral valve is normal in structure. Trivial mitral valve regurgitation. No evidence of mitral valve stenosis. Tricuspid Valve: The tricuspid valve is normal in structure. Tricuspid valve regurgitation is mild. Aortic Valve: The aortic valve is tricuspid. Aortic valve regurgitation is not visualized. Mild aortic valve sclerosis is present, with no evidence of aortic valve stenosis. Pulmonic Valve: The pulmonic valve was normal in structure. Pulmonic valve regurgitation is not visualized. Aorta: The aortic root is normal in size and structure. Venous: The inferior vena cava is normal in size with greater than 50% respiratory variability, suggesting  right atrial pressure of 3 mmHg. IAS/Shunts: No atrial level shunt detected by color flow Doppler.  LEFT VENTRICLE PLAX 2D LVIDd:         3.40 cm  Diastology LVIDs:         2.40 cm  LV e' medial:    7.07 cm/s LV PW:         1.20 cm  LV E/e' medial:  13.0 LV IVS:        1.50 cm  LV e' lateral:   8.59 cm/s LVOT diam:     1.90 cm  LV E/e' lateral: 10.7 LV SV:         83 LV SV Index:   43 LVOT Area:     2.84 cm  RIGHT VENTRICLE RV S prime:     17.90 cm/s TAPSE (M-mode): 2.1 cm  LEFT ATRIUM             Index       RIGHT ATRIUM          Index LA diam:        2.75 cm 1.42 cm/m  RA Area:     9.54 cm LA Vol (A2C):   33.0 ml 17.02 ml/m RA Volume:   16.20 ml 8.35 ml/m LA Vol (A4C):   46.8 ml 24.13 ml/m LA Biplane Vol: 41.1 ml 21.19 ml/m  AORTIC VALVE LVOT Vmax:   127.00 cm/s LVOT Vmean:  86.800 cm/s LVOT VTI:    0.291 m  AORTA Ao Root diam: 3.60 cm Ao Asc diam:  3.40 cm MITRAL VALVE                TRICUSPID VALVE MV Area (PHT): 4.41 cm     TR Peak grad:   37.2 mmHg MV Decel Time: 172 msec     TR Vmax:        305.00 cm/s MV E velocity: 91.80 cm/s MV A velocity: 122.00 cm/s  SHUNTS MV E/A ratio:  0.75         Systemic VTI:  0.29 m                             Systemic Diam: 1.90 cm Loralie Champagne MD Electronically signed by Loralie Champagne MD Signature Date/Time: 11/07/2020/5:40:54 PM    Final    DG Hip Unilat W or Wo Pelvis 2-3 Views Right  Result Date: 11/07/2020 CLINICAL DATA:  Pain following fall EXAM: DG HIP (WITH OR WITHOUT PELVIS) 2-3V RIGHT COMPARISON:  None. FINDINGS: Frontal pelvis as well as frontal and lateral right hip images were obtained. There is an intertrochanteric femur fracture on the right with slight impaction at the fracture site. No other fracture. No dislocation. There is severe narrowing of the right hip joint with moderate narrowing of the left hip joint. No erosion. There is extensive distal aortic and iliac artery atherosclerotic calcification. There is also common femoral and superficial femoral artery atherosclerotic calcification bilaterally. There are surgical clips in each inguinal region. IMPRESSION: Intertrochanteric femur fracture on the right with mild impaction at the fracture site. No other acute fracture. No dislocation. 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 There is multifocal atherosclerotic arterial vascular calcification. Electronically Signed   By: Lowella Grip III M.D.   On: 11/07/2020  11:47    Lab Data:  CBC: Recent Labs  Lab 11/07/20 1200 11/08/20 0319  WBC 10.0 8.9  NEUTROABS 6.9  --   HGB 10.3* 9.1*  HCT 30.8* 26.5*  MCV 105.5* 103.9*  PLT 163 751   Basic Metabolic Panel: Recent Labs  Lab 11/07/20 1200 11/08/20 0319  NA 140 139  K 4.2 5.0  CL 101 102  CO2 26 23  GLUCOSE 134* 103*  BUN 49* 59*  CREATININE 10.06* 10.97*  CALCIUM 9.8 9.5   GFR: CrCl cannot be calculated (Unknown ideal weight.). Liver Function Tests: Recent Labs  Lab 11/07/20 1200  AST 21  ALT 12  ALKPHOS 83  BILITOT 0.6  PROT 6.0*  ALBUMIN 2.9*   No results for input(s): LIPASE, AMYLASE in the last 168 hours. No results for input(s): AMMONIA in the last 168 hours. Coagulation Profile: No results for input(s): INR, PROTIME in the last 168 hours. Cardiac Enzymes: No results for input(s): CKTOTAL, CKMB, CKMBINDEX, TROPONINI in the last 168 hours. BNP (last 3 results) No results for input(s): PROBNP in the last 8760 hours. HbA1C: No results for input(s): HGBA1C in the last 72 hours. CBG: Recent Labs  Lab 11/07/20 1758 11/08/20 0024 11/08/20 0903  GLUCAP 120* 109* 90   Lipid Profile: No results for input(s): CHOL, HDL, LDLCALC, TRIG, CHOLHDL, LDLDIRECT in the last 72 hours. Thyroid Function Tests: No results for input(s): TSH, T4TOTAL, FREET4, T3FREE, THYROIDAB in the last 72 hours. Anemia Panel: No results for input(s): VITAMINB12, FOLATE, FERRITIN, TIBC, IRON, RETICCTPCT in the last 72 hours. Urine analysis:    Component Value Date/Time   COLORURINE YELLOW 07/21/2018 1044   APPEARANCEUR CLOUDY (A) 07/21/2018 1044   LABSPEC 1.009 07/21/2018 1044   PHURINE 9.0 (H) 07/21/2018 1044   GLUCOSEU NEGATIVE 07/21/2018 1044   HGBUR NEGATIVE 07/21/2018 1044   BILIRUBINUR NEGATIVE 07/21/2018 1044   BILIRUBINUR neg 02/14/2015 1115   KETONESUR NEGATIVE 07/21/2018 1044   PROTEINUR 100 (A) 07/21/2018 1044   UROBILINOGEN 0.2 02/20/2015 0944   NITRITE NEGATIVE 07/21/2018  1044   LEUKOCYTESUR SMALL (A) 07/21/2018 1044     Jerol Rufener M.D. [facility] 11/08/2020, 11:29 AM   Call night coverage person covering after 7pm

## 2020-11-08 NOTE — Transfer of Care (Signed)
Immediate Anesthesia Transfer of Care Note  Patient: [name]  Procedure(s) Performed: INTRAMEDULLARY (IM) NAIL INTERTROCHANTRIC (Right )  Patient Location: PACU  Anesthesia Type:General  Level of Consciousness: awake, alert  and oriented  Airway & Oxygen Therapy: Patient Spontanous Breathing  Post-op Assessment: Report given to RN and Post -op Vital signs reviewed and stable  Post vital signs: Reviewed and stable  Last Vitals:  Vitals Value Taken Time  BP    Temp    Pulse    Resp    SpO2      Last Pain:  Vitals:   11/08/20 0749  TempSrc: Oral  PainSc:       Patients Stated Pain Goal: 3 (14/43/15 4008)  Complications: No complications documented.

## 2020-11-08 NOTE — Anesthesia Procedure Notes (Signed)
Procedure Name: Intubation Date/Time: 11/08/2020 10:27 AM Performed by: Griffin Dakin, CRNA Pre-anesthesia Checklist: Patient identified, Emergency Drugs available, Suction available and Patient being monitored Patient Re-evaluated:Patient Re-evaluated prior to induction Oxygen Delivery Method: Circle system utilized Preoxygenation: Pre-oxygenation with 100% oxygen Induction Type: IV induction Ventilation: Mask ventilation without difficulty Laryngoscope Size: Mac and 4 Grade View: Grade I Tube type: Oral Tube size: 7.5 mm Number of attempts: 1 Airway Equipment and Method: Stylet and Oral airway Placement Confirmation: ETT inserted through vocal cords under direct vision,  positive ETCO2 and breath sounds checked- equal and bilateral Secured at: 23 cm Tube secured with: Tape Dental Injury: Teeth and Oropharynx as per pre-operative assessment

## 2020-11-08 NOTE — Anesthesia Postprocedure Evaluation (Signed)
Anesthesia Post Note  Patient: [name]  Procedure(s) Performed: INTRAMEDULLARY (IM) NAIL INTERTROCHANTRIC (Right )     Patient location during evaluation: PACU Anesthesia Type: General Level of consciousness: awake and alert Pain management: pain level controlled Vital Signs Assessment: post-procedure vital signs reviewed and stable Respiratory status: spontaneous breathing, nonlabored ventilation, respiratory function stable and patient connected to nasal cannula oxygen Cardiovascular status: blood pressure returned to baseline and stable Postop Assessment: no apparent nausea or vomiting Anesthetic complications: no   No complications documented.  Last Vitals:  Vitals:   11/08/20 1218 11/08/20 1228  BP:  (!) 140/101  Pulse: 82 79  Resp: 15   Temp: (!) 36.1 C 36.6 C  SpO2: 98% 98%    Last Pain:  Vitals:   11/08/20 1228  TempSrc: Axillary  PainSc:                  Tiajuana Amass

## 2020-11-08 NOTE — Plan of Care (Signed)

## 2020-11-08 NOTE — Progress Notes (Signed)
[name] Progress Note   Subjective:   Patient seen and examined at bedside in room.  About to go to surgery.  Resistant to idea of having HD post surgery.    Objective Vitals:   11/07/20 2000 11/08/20 0100 11/08/20 0445 11/08/20 0749  BP: (!) 167/71 (!) 165/61 (!) 154/58 (!) 156/53  Pulse: 77 75 71 74  Resp: 16   17  Temp: 98.6 F (37 C) 99.3 F (37.4 C) 98.6 F (37 C) 99.9 F (37.7 C)  TempSrc: Oral Oral Oral Oral  SpO2: 97% 100% 100% 98%   Physical Exam General:WDWN, alert male in NAD Heart:RRR Lungs:CTAB anteriorly, nml WOB Abdomen:non distended Extremities:no LE edema Dialysis Access: RU AVF   There were no vitals filed for this visit. No intake or output data in the 24 hours ending 11/08/20 0914  Additional Objective Labs: Basic Metabolic Panel: Recent Labs  Lab 11/07/20 1200 11/08/20 0319  NA 140 139  K 4.2 5.0  CL 101 102  CO2 26 23  GLUCOSE 134* 103*  BUN 49* 59*  CREATININE 10.06* 10.97*  CALCIUM 9.8 9.5   Liver Function Tests: Recent Labs  Lab 11/07/20 1200  AST 21  ALT 12  ALKPHOS 83  BILITOT 0.6  PROT 6.0*  ALBUMIN 2.9*   CBC: Recent Labs  Lab 11/07/20 1200 11/08/20 0319  WBC 10.0 8.9  NEUTROABS 6.9  --   HGB 10.3* 9.1*  HCT 30.8* 26.5*  MCV 105.5* 103.9*  PLT 163 157   CBG: Recent Labs  Lab 11/07/20 1758 11/08/20 0024 11/08/20 0903  GLUCAP 120* 109* 90   Studies/Results: DG Chest 1 View  Result Date: 11/07/2020 CLINICAL DATA:  Pain following fall EXAM: CHEST  1 VIEW COMPARISON:  May 26, 2020 FINDINGS: There is slight left base atelectasis. Lungs elsewhere clear. Heart size and pulmonary vascularity are within normal limits. There is aortic atherosclerosis. No evident adenopathy. No bone lesions. IMPRESSION: Slight left base atelectasis. Lungs otherwise clear. Heart size normal. Aortic Atherosclerosis (ICD10-I70.0). Electronically Signed   By: Lowella Grip III M.D.   On: 11/07/2020 11:51   CT  Head Wo Contrast  Result Date: 11/07/2020 CLINICAL DATA:  Fall. EXAM: CT HEAD WITHOUT CONTRAST CT CERVICAL SPINE WITHOUT CONTRAST TECHNIQUE: Multidetector CT imaging of the head and cervical spine was performed following the standard protocol without intravenous contrast. Multiplanar CT image reconstructions of the cervical spine were also generated. COMPARISON:  May 26, 2020. FINDINGS: CT HEAD FINDINGS Brain: Stable bilateral frontal chronic hygromas are noted, left greater than right. Mild diffuse cortical atrophy is noted. Ventricular size is within normal limits. No evidence of acute hemorrhage, infarction or intracranial mass lesion is noted. Vascular: No hyperdense vessel or unexpected calcification. Skull: Status post bilateral frontal craniotomies. No acute osseous abnormality is noted. Sinuses/Orbits: No acute finding. Other: None. CT CERVICAL SPINE FINDINGS Alignment: Normal. Skull base and vertebrae: Old C7 fracture is noted. No acute fracture is noted. Soft tissues and spinal canal: No prevertebral fluid or swelling. No visible canal hematoma. Disc levels: Moderate degenerative disc disease is noted at C3-4, C4-5, C5-6, C6-7 and C7-T1. Upper chest: Negative. Other: None. IMPRESSION: 1. Stable bilateral frontal chronic hygromas are noted, left greater than right. Mild diffuse cortical atrophy. No acute intracranial abnormality seen. 2. Multilevel degenerative disc disease. No acute abnormality seen in the cervical spine. Electronically Signed   By: Marijo Conception M.D.   On: 11/07/2020 12:00   CT Cervical Spine Wo Contrast  Result Date:  11/07/2020 CLINICAL DATA:  Fall. EXAM: CT HEAD WITHOUT CONTRAST CT CERVICAL SPINE WITHOUT CONTRAST TECHNIQUE: Multidetector CT imaging of the head and cervical spine was performed following the standard protocol without intravenous contrast. Multiplanar CT image reconstructions of the cervical spine were also generated. COMPARISON:  May 26, 2020. FINDINGS:  CT HEAD FINDINGS Brain: Stable bilateral frontal chronic hygromas are noted, left greater than right. Mild diffuse cortical atrophy is noted. Ventricular size is within normal limits. No evidence of acute hemorrhage, infarction or intracranial mass lesion is noted. Vascular: No hyperdense vessel or unexpected calcification. Skull: Status post bilateral frontal craniotomies. No acute osseous abnormality is noted. Sinuses/Orbits: No acute finding. Other: None. CT CERVICAL SPINE FINDINGS Alignment: Normal. Skull base and vertebrae: Old C7 fracture is noted. No acute fracture is noted. Soft tissues and spinal canal: No prevertebral fluid or swelling. No visible canal hematoma. Disc levels: Moderate degenerative disc disease is noted at C3-4, C4-5, C5-6, C6-7 and C7-T1. Upper chest: Negative. Other: None. IMPRESSION: 1. Stable bilateral frontal chronic hygromas are noted, left greater than right. Mild diffuse cortical atrophy. No acute intracranial abnormality seen. 2. Multilevel degenerative disc disease. No acute abnormality seen in the cervical spine. Electronically Signed   By: Marijo Conception M.D.   On: 11/07/2020 12:00   ECHOCARDIOGRAM COMPLETE  Result Date: 11/07/2020    ECHOCARDIOGRAM REPORT   Patient Name:   [name] Chauncey Date of Exam: 11/07/2020 Medical Rec #:  [id number]      Height:       71.0 in Accession #:    [id number]     Weight:       164.2 lb Date of Birth:  01-21-1941      BSA:          1.939 m Patient Age:    80 years       BP:           156/59 mmHg Patient Gender: M              HR:           73 bpm. Exam Location:  Inpatient Procedure: 2D Echo, Cardiac Doppler and Color Doppler Indications:    CHF-Acute Diastolic Y18.56  History:        Patient has prior history of Echocardiogram examinations, most                 recent 04/29/2018. CAD, PAD, Signs/Symptoms:Dyspnea; Risk                 Factors:Diabetes, Hypertension, Dyslipidemia and Sleep Apnea.                 Thyroid disease. Renal  insufficiency. End stage renal disease.                 Mild dementia. Hip fracture.  Sonographer:    Darlina Sicilian RDCS Referring Phys: [id number] [place]  1. Left ventricular ejection fraction, by estimation, is 55 to 60%. The left ventricle has normal function. The left ventricle has no regional wall motion abnormalities. There is mild left ventricular hypertrophy. Left ventricular diastolic parameters are consistent with Grade I diastolic dysfunction (impaired relaxation).  2. Right ventricular systolic function is normal. The right ventricular size is normal. There is mildly elevated pulmonary artery systolic pressure. The estimated right ventricular systolic pressure is 63.7 mmHg.  3. The mitral valve is normal in structure. Trivial mitral valve regurgitation. No evidence of mitral stenosis.  4. The aortic  valve is tricuspid. Aortic valve regurgitation is not visualized. Mild aortic valve sclerosis is present, with no evidence of aortic valve stenosis.  5. The inferior vena cava is normal in size with greater than 50% respiratory variability, suggesting right atrial pressure of 3 mmHg. FINDINGS  Left Ventricle: Left ventricular ejection fraction, by estimation, is 55 to 60%. The left ventricle has normal function. The left ventricle has no regional wall motion abnormalities. The left ventricular internal cavity size was normal in size. There is  mild left ventricular hypertrophy. Left ventricular diastolic parameters are consistent with Grade I diastolic dysfunction (impaired relaxation). Right Ventricle: The right ventricular size is normal. No increase in right ventricular wall thickness. Right ventricular systolic function is normal. There is mildly elevated pulmonary artery systolic pressure. The tricuspid regurgitant velocity is 3.05  m/s, and with an assumed right atrial pressure of 3 mmHg, the estimated right ventricular systolic pressure is 41.7 mmHg. Left Atrium: Left atrial size was  normal in size. Right Atrium: Right atrial size was normal in size. Pericardium: There is no evidence of pericardial effusion. Mitral Valve: The mitral valve is normal in structure. Trivial mitral valve regurgitation. No evidence of mitral valve stenosis. Tricuspid Valve: The tricuspid valve is normal in structure. Tricuspid valve regurgitation is mild. Aortic Valve: The aortic valve is tricuspid. Aortic valve regurgitation is not visualized. Mild aortic valve sclerosis is present, with no evidence of aortic valve stenosis. Pulmonic Valve: The pulmonic valve was normal in structure. Pulmonic valve regurgitation is not visualized. Aorta: The aortic root is normal in size and structure. Venous: The inferior vena cava is normal in size with greater than 50% respiratory variability, suggesting right atrial pressure of 3 mmHg. IAS/Shunts: No atrial level shunt detected by color flow Doppler.  LEFT VENTRICLE PLAX 2D LVIDd:         3.40 cm  Diastology LVIDs:         2.40 cm  LV e' medial:    7.07 cm/s LV PW:         1.20 cm  LV E/e' medial:  13.0 LV IVS:        1.50 cm  LV e' lateral:   8.59 cm/s LVOT diam:     1.90 cm  LV E/e' lateral: 10.7 LV SV:         83 LV SV Index:   43 LVOT Area:     2.84 cm  RIGHT VENTRICLE RV S prime:     17.90 cm/s TAPSE (M-mode): 2.1 cm LEFT ATRIUM             Index       RIGHT ATRIUM          Index LA diam:        2.75 cm 1.42 cm/m  RA Area:     9.54 cm LA Vol (A2C):   33.0 ml 17.02 ml/m RA Volume:   16.20 ml 8.35 ml/m LA Vol (A4C):   46.8 ml 24.13 ml/m LA Biplane Vol: 41.1 ml 21.19 ml/m  AORTIC VALVE LVOT Vmax:   127.00 cm/s LVOT Vmean:  86.800 cm/s LVOT VTI:    0.291 m  AORTA Ao Root diam: 3.60 cm Ao Asc diam:  3.40 cm MITRAL VALVE                TRICUSPID VALVE MV Area (PHT): 4.41 cm     TR Peak grad:   37.2 mmHg MV Decel Time: 172 msec     TR Vmax:  305.00 cm/s MV E velocity: 91.80 cm/s MV A velocity: 122.00 cm/s  SHUNTS MV E/A ratio:  0.75         Systemic VTI:  0.29 m                              Systemic Diam: 1.90 cm Loralie Champagne MD Electronically signed by Loralie Champagne MD Signature Date/Time: 11/07/2020/5:40:54 PM    Final    DG Hip Unilat W or Wo Pelvis 2-3 Views Right  Result Date: 11/07/2020 CLINICAL DATA:  Pain following fall EXAM: DG HIP (WITH OR WITHOUT PELVIS) 2-3V RIGHT COMPARISON:  None. FINDINGS: Frontal pelvis as well as frontal and lateral right hip images were obtained. There is an intertrochanteric femur fracture on the right with slight impaction at the fracture site. No other fracture. No dislocation. There is severe narrowing of the right hip joint with moderate narrowing of the left hip joint. No erosion. There is extensive distal aortic and iliac artery atherosclerotic calcification. There is also common femoral and superficial femoral artery atherosclerotic calcification bilaterally. There are surgical clips in each inguinal region. IMPRESSION: Intertrochanteric femur fracture on the right with mild impaction at the fracture site. No other acute fracture. No dislocation. 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 There is multifocal atherosclerotic arterial vascular calcification. Electronically Signed   By: Lowella Grip III M.D.   On: 11/07/2020 11:47    Medications:  . allopurinol  100 mg Oral Daily  . Chlorhexidine Gluconate Cloth  6 each Topical Q0600  . [MAR Hold] docusate sodium  100 mg Oral BID  . [MAR Hold] donepezil  10 mg Oral QHS  . [MAR Hold] feeding supplement (NEPRO CARB STEADY)  237 mL Oral TID WC  . [MAR Hold] fluticasone  1 spray Each Nare Daily  . [MAR Hold] gabapentin  100 mg Oral QHS  . [MAR Hold] insulin aspart  0-6 Units Subcutaneous TID WC  . [MAR Hold] levothyroxine  50 mcg Oral QAC breakfast  . [MAR Hold] linagliptin  5 mg Oral Daily  . loratadine  10 mg Oral Daily  . [MAR Hold] memantine  10 mg Oral BID  . [MAR Hold] midodrine  10 mg Oral Q M,W,F-HD  . [MAR  Hold] pantoprazole  40 mg Oral Daily  . sevelamer carbonate  2,400 mg Oral 3 times per day on Sun Tue Thu Sat  . [START ON 11/09/2020] sevelamer carbonate  2,400 mg Oral 2 times per day on Mon Wed Fri  . [MAR Hold] simvastatin  20 mg Oral QHS    Dialysis Orders: [facility]  4hrs, BFR 400, DFR 500,  EDW 74kg, 2K/ 2Ca  Access: RU AVF  Heparin none Mircera 75 mcg q2wks - last 178mcg on 10/28/20  Assessment/Plan: 1.  R femur Frx - going to surgery now  2.  ESRD -  On HD MWF.  Last HD Friday.  Labs close to baseline, K 5.0.  No signs of uremia, volume overload or respiratory distress.  Plan for HD today post surgery. Will resume regular schedule tomorrow.  3.  Hypertension/volume  - Blood pressure mildly elevated. Continue home meds.  On midodrine with HD. Does not appear volume overloaded.  UF as tolerated.  4.  Anemia of CKD - Hgb 9.1 today. ESA due Friday, will order.  Expect drop in Hgb post surgery.  Follow trends.  5.  Secondary Hyperparathyroidism -  Corrected Ca high.  Not on VDRA, use low Ca bath. Will check phos. Continue Renvela.  6.  Nutrition - Renal diet with fluid restrictions.  Protein supplements. Renal vitamin.  7. DM - per primary 8. Hypothyroidism - on synthroid.  9. Gout 10. HLD 11. DOE - eval by cardiology. Echo with EF 50-55%, G1DD. Plan for OP follow up.    Jen Mow, PA-C [name] 11/08/2020,9:14 AM  LOS: 1 day

## 2020-11-08 NOTE — Progress Notes (Signed)
PHARMACY NOTE:  ANTIMICROBIAL RENAL DOSAGE ADJUSTMENT  Current antimicrobial regimen includes a mismatch between antimicrobial dosage and estimated renal function.  As per policy approved by the Pharmacy & Therapeutics and Medical Executive Committees, the antimicrobial dosage will be adjusted accordingly.  Current antimicrobial dosage:  Vancomycin 1gm IV x 1, give 12 hrs post pre-op dose (pre-op dose given around 1030)  Indication: surgical prophylaxis  Renal Function:  Estimated Creatinine Clearance: 5.8 mL/min (A) (by C-G formula based on SCr of 10.97 mg/dL (H)). [x]      On intermittent HD, scheduled: 2/15 per discussion with HD staff []      On CRRT    Antimicrobial dosage has been changed to:  vanc 750mg  IV x 1 after HD.  If not going to HD, patient will not need another dose (floor RN aware).   Lyndy Russman D. Mina Marble, PharmD, BCPS, [place] 11/08/2020, 12:36 PM

## 2020-11-08 NOTE — Op Note (Signed)
[name] male 80 y.o. 11/08/2020  PreOperative Diagnosis: Right intertrochanteric hip fracture   PostOperative Diagnosis: Same   PROCEDURE: Cephalomedullary nailing of right intertrochanteric hip fracture Intraoperative use of fluoroscopy  SURGEON: Melony Overly, MD  ASSISTANT: None  ANESTHESIA: General ET tube  FINDINGS: Displaced and comminuted intertrochanteric hip fracture  IMPLANTS: Arthrex short 11 mm intertrochanteric nail  INDICATIONS:79 y.o. male fell at home and sustained the above fracture.  He was seen in the emergency department where x-rays diagnosed his injury.  He was admitted to the hospitalist team.  He was evaluated by cardiology and the hospitalist team and deemed suitable for surgery.  I spoke with his wife who consented for surgery.   Patient understood the risks, benefits and alternatives to surgery which include but are not limited to wound healing complications, infection, nonunion, malunion, need for further surgery as well as damage to surrounding structures. They also understood the potential for continued pain in that there were no guarantees of acceptable outcome After weighing these risks the patient opted to proceed with surgery.  PROCEDURE:   Patient was identified in the preoperative holding area and the right hip was marked by myself.  The consent was signed by myself and the patient.  This identified the right lower extremity as the appropriate operative extremity and the surgeries to be performed was operative treatment of left hip fracture. They  were taken to the operating room where general endotracheal anesthesia was induced without difficulty and then was moved supine on a Hana table.  The operative leg was placed into the traction boot.  The non-operative leg was well-padded and affixed to the right leg holder with coban.  The arm was crossed over the chest and well padded. 2 g of Ancef was given.  Surgical timeout was performed.   Using fluoroscopy appropriate AP and lateral x-rays of the left hip were obtained.  Then using traction through the Hana table was able to reduce the fracture fragments in a closed fashion.  Acceptable reduction was confirmed on AP and lateral x-rays.  Then the right hip was prepped and draped in usual sterile fashion.  A shower curtain drape was placed.  We began the surgery by placing the guidepin percutaneously at the appropriate starting point at the tip of the greater trochanter.  The appropriate starting point was confirmed on AP and lateral radiograph.  Then a 3 cm incision was made about the site of the percutaneous placement of the pin and the opening reamer with a soft tissue guide was placed into the wound down to the tip of the greater trochanter and the bone was entered with the opening reamer down to the level of lesser trochanter.  Then we chose an 11 mm short cephalo-medullary nail.  This was placed without difficulty and appropriate positioning was confirmed on fluoroscopy.  Then using the blade insertion guide a second incision was made distally down the thigh to allow for placement of the cephalo-medullary blade.  The guidepin for the blade was placed up the femoral neck in the appropriate position center center in the femoral head.  Then the reamer was used to ream for the blade and the cephalomedullary screw was placed without difficulty.  The length of the blade was a 105 mm.  Then the set screw was tightened.  The distal interlocking screw was placed through the guide.  Confirmation was made via fluoroscopy that the screw lengths were appropriate and the position was acceptable.  Final films were  taken.  The wounds were irrigated with normal saline and the incisions were closed in a layered fashion using 2-0 Monocryl and staples.  Mepilex dressings were used.  He was then transferred to the hospital bed and awakened from anesthesia without difficulty.  Counts were correct at the end the  case.  There were no complications.  POST OPERATIVE INSTRUCTIONS: Weightbearing as tolerated right lower extremity Mobilize with physical therapy Disposition per therapy Okay to resume DVT prophylaxis postoperative day 1.    BLOOD LOSS:  200 mL         DRAINS: none         SPECIMEN: none       COMPLICATIONS:  * No complications entered in OR log *         Disposition: PACU - hemodynamically stable.         Condition: stable

## 2020-11-08 NOTE — Progress Notes (Signed)
Patient to dialysis via bed with cardiac monitor in place

## 2020-11-08 NOTE — Plan of Care (Signed)
Patient arrived on unit stable. BP elevated, PRN given. Pt pain controlled with PRNs, resting well. Spoke with wife on phone, due to dementia pt unable to sign consent for surgery. Will handoff in report for doctor to call wife in regards to surgery for verbal consent.    Problem: Education: Goal: Knowledge of General Education information will improve Description: Including pain rating scale, medication(s)/side effects and non-pharmacologic comfort measures Outcome: Progressing   Problem: Activity: Goal: Risk for activity intolerance will decrease Outcome: Progressing   Problem: Pain Managment: Goal: General experience of comfort will improve Outcome: Progressing   Problem: Safety: Goal: Ability to remain free from injury will improve Outcome: Progressing   Problem: Skin Integrity: Goal: Risk for impaired skin integrity will decrease Outcome: Progressing

## 2020-11-09 ENCOUNTER — Encounter (HOSPITAL_COMMUNITY): Payer: Self-pay | Admitting: Orthopaedic Surgery

## 2020-11-09 DIAGNOSIS — S72144A Nondisplaced intertrochanteric fracture of right femur, initial encounter for closed fracture: Secondary | ICD-10-CM | POA: Diagnosis not present

## 2020-11-09 DIAGNOSIS — Z01818 Encounter for other preprocedural examination: Secondary | ICD-10-CM

## 2020-11-09 LAB — RENAL FUNCTION PANEL
Albumin: 2.2 g/dL — ABNORMAL LOW (ref 3.5–5.0)
Anion gap: 13 (ref 5–15)
BUN: 39 mg/dL — ABNORMAL HIGH (ref 8–23)
CO2: 26 mmol/L (ref 22–32)
Calcium: 9.2 mg/dL (ref 8.9–10.3)
Chloride: 98 mmol/L (ref 98–111)
Creatinine, Ser: 7.83 mg/dL — ABNORMAL HIGH (ref 0.61–1.24)
GFR, Estimated: 6 mL/min — ABNORMAL LOW (ref >60)
Glucose, Bld: 98 mg/dL (ref 70–99)
Phosphorus: 5.4 mg/dL — ABNORMAL HIGH (ref 2.5–4.6)
Potassium: 4.5 mmol/L (ref 3.5–5.1)
Sodium: 137 mmol/L (ref 135–145)

## 2020-11-09 LAB — GLUCOSE, CAPILLARY
Glucose-Capillary: 79 mg/dL (ref 70–99)
Glucose-Capillary: 98 mg/dL (ref 70–99)
Glucose-Capillary: 85 mg/dL (ref 70–99)
Glucose-Capillary: 169 mg/dL — ABNORMAL HIGH (ref 70–99)

## 2020-11-09 LAB — CBC
HCT: 24.7 % — ABNORMAL LOW (ref 39.0–52.0)
Hemoglobin: 8.1 g/dL — ABNORMAL LOW (ref 13.0–17.0)
MCH: 33.2 pg (ref 26.0–34.0)
MCHC: 32.8 g/dL (ref 30.0–36.0)
MCV: 101.2 fL — ABNORMAL HIGH (ref 80.0–100.0)
Platelets: 168 10*3/uL (ref 150–400)
RBC: 2.44 MIL/uL — ABNORMAL LOW (ref 4.22–5.81)
RDW: 16.4 % — ABNORMAL HIGH (ref 11.5–15.5)
WBC: 9.6 10*3/uL (ref 4.0–10.5)
nRBC: 0 % (ref 0.0–0.2)

## 2020-11-09 LAB — PHOSPHORUS: Phosphorus: 4.5 mg/dL (ref 2.5–4.6)

## 2020-11-09 MED ORDER — MIDODRINE HCL 5 MG PO TABS
ORAL_TABLET | ORAL | Status: AC
  Filled 2020-11-09: qty 2

## 2020-11-09 MED ORDER — VANCOMYCIN HCL 750 MG/150ML IV SOLN
750.0000 mg | Freq: Once | INTRAVENOUS | Status: AC
  Administered 2020-11-09: 750 mg via INTRAVENOUS
  Filled 2020-11-09: qty 150

## 2020-11-09 NOTE — Evaluation (Signed)
Physical Therapy Evaluation Patient Details Name: [name] MRN: [id number] DOB: 06-09-1941 Today's Date: 11/09/2020   History of Present Illness  [name] is a 80 y.o. male with medical history significant of ESRD on HD, HTN, IDDM, diabetic neuropathy, hypothyroidism, gout, chronic ambulation dysfunction, presented with mechanical fall sustaining a R intertrochanteric  fx who underwent and R IM Nail on 2/15.    Clinical Impression  Pt confused and poor historian however pt chart pt was at home with spouse and amb without AD. Pt not convinced he is in [place] [facility]. At this time pt able to tolerate quad sets, glut sets and AAROM to R hip and knee about a 1/4 of the ROM. Pt requiring maxAx2 to transfer to EOB and was unable to attempt standing due to pt inability to maintain sitting EOB due to significant posterior left lateral lean. Acute PT to cont to follow. Pt to benefit from SNF upon d/c to maximize functional recovery.    Follow Up Recommendations SNF;Supervision/Assistance - 24 hour    Equipment Recommendations  Rolling walker with 5" wheels    Recommendations for Other Services       Precautions / Restrictions Precautions Precautions: Fall Restrictions Weight Bearing Restrictions: Yes RLE Weight Bearing: Weight bearing as tolerated      Mobility  Bed Mobility Overal bed mobility: Needs Assistance Bed Mobility: Supine to Sit     Supine to sit: Max assist;+2 for physical assistance     General bed mobility comments: max verbal and direstional cues, maxA for LE managment and trunk elevation, pt with strong L lateral lean off R hip and unable to tolerate upright position due to pain. pt did progress s/p 5 min to improve trunk flexion with minA but continues with significant L lateral lean    Transfers                 General transfer comment: unable to attempt safely today  Ambulation/Gait             General Gait Details: unable this  date  Stairs            Wheelchair Mobility    Modified Rankin (Stroke Patients Only)       Balance Overall balance assessment: Needs assistance Sitting-balance support: Feet supported;Bilateral upper extremity supported Sitting balance-Leahy Scale: Poor Sitting balance - Comments: required min to maxA posteriorly due to leaning back onto L UE and collapsing to the L elbow to get off R hip pain Postural control: Posterior lean;Left lateral lean                                   Pertinent Vitals/Pain Pain Assessment: Faces Faces Pain Scale: Hurts whole lot Pain Location: R hip with mobility Pain Descriptors / Indicators: Grimacing;Guarding Pain Intervention(s): Monitored during session;Limited activity within patient's tolerance    Home Living Family/patient expects to be discharged to:: Skilled nursing facility Living Arrangements: Spouse/significant other               Additional Comments: PTA pt lived with spouse. he reports 1 story home with tub shower. pt states he didn't [place] an equipment    Prior Function Level of Independence: Independent         Comments: per patient he ambulated on own without AD and took a bus to HD, unsure of accuracy of report of PLOF  Hand Dominance   Dominant Hand: Right    Extremity/Trunk Assessment   Upper Extremity Assessment Upper Extremity Assessment: Overall WFL for tasks assessed    Lower Extremity Assessment Lower Extremity Assessment: RLE deficits/detail RLE Deficits / Details: able to tolerate gentle ROM to R knee and hip, limited by onset of pain, unable to stand to evaluate ability to WB    Cervical / Trunk Assessment Cervical / Trunk Assessment: Normal  Communication   Communication: No difficulties  Cognition Arousal/Alertness: Awake/alert Behavior During Therapy: WFL for tasks assessed/performed Overall Cognitive Status: Impaired/Different from baseline Area of Impairment:  Orientation;Attention;Memory;Following commands;Safety/judgement;Problem solving                 Orientation Level: Disoriented to;Place;Time;Situation (re-oriented several times, states " I just dont believe we are in [place] at the hospital." "this is my home") Current Attention Level: Focused Memory: Decreased short-term memory Following Commands: Follows one step commands inconsistently;Follows one step commands with increased time Safety/Judgement: Decreased awareness of safety;Decreased awareness of deficits   Problem Solving: Slow processing;Decreased initiation;Difficulty sequencing;Requires verbal cues;Requires tactile cues General Comments: pt confused, restless at times per nursing but did fine with physical therapy, pt had pulled bilat mittens off, RN aware, pt with difficulty sequencing task asked, distracted by onset of pain      General Comments General comments (skin integrity, edema, etc.): VSS, R hip dressing with some noted drainage    Exercises     Assessment/Plan    PT Assessment Patient needs continued PT services  PT Problem List Decreased strength;Decreased range of motion;Decreased activity tolerance;Decreased balance;Decreased mobility;Decreased coordination;Decreased knowledge of use of DME       PT Treatment Interventions DME instruction;Gait training;Stair training;Functional mobility training;Therapeutic activities;Therapeutic exercise;Balance training;Neuromuscular re-education    PT Goals (Current goals can be found in the Care Plan section)  Acute Rehab PT Goals PT Goal Formulation: With patient Time For Goal Achievement: 11/23/20 Potential to Achieve Goals: Good    Frequency Min 3X/week   Barriers to discharge        Co-evaluation               AM-PAC PT "6 Clicks" Mobility  Outcome Measure Help needed turning from your back to your side while in a flat bed without using bedrails?: A Lot Help needed moving from lying on  your back to sitting on the side of a flat bed without using bedrails?: A Lot Help needed moving to and from a bed to a chair (including a wheelchair)?: A Lot Help needed standing up from a chair using your arms (e.g., wheelchair or bedside chair)?: Total Help needed to walk in hospital room?: Total Help needed climbing 3-5 steps with a railing? : Total 6 Click Score: 9    End of Session   Activity Tolerance: Patient limited by pain Patient left: in bed;with call bell/phone within reach;with bed alarm set Nurse Communication: Mobility status PT Visit Diagnosis: Unsteadiness on feet (R26.81);Other abnormalities of gait and mobility (R26.89);Muscle weakness (generalized) (M62.81);Difficulty in walking, not elsewhere classified (R26.2)    Time: 4008-6761 PT Time Calculation (min) (ACUTE ONLY): 24 min   Charges:   PT Evaluation $PT Eval Moderate Complexity: 1 Mod PT Treatments $Therapeutic Activity: 8-22 mins        Kittie Plater, PT, DPT Acute Rehabilitation Services Pager #: [phone] Office #: [phone]   Berline Lopes 11/09/2020, 1:05 PM

## 2020-11-09 NOTE — Progress Notes (Signed)
     [name] is a 80 y.o. male   Orthopaedic diagnosis: Right intertrochanteric hip fracture status post intramedullary nail on 11/08/2020  Subjective: Patient is resting comfortably.  He is worsened with physical therapy.  Pain improved since surgery.  Objectyive: Vitals:   11/09/20 0340 11/09/20 0937  BP: (!) 109/49 (!) 124/44  Pulse: 80 80  Resp: 17   Temp: 98.6 F (37 C) 98.4 F (36.9 C)  SpO2: 96% 100%     Exam: Awake and alert Respirations even and unlabored No acute distress  Right hip with some shadowing on the distal dressing.  Tolerates gentle passive range of motion of the hip.  No tenderness to squeeze about the calf.  Able to dorsiflex and plantarflex the ankle.  Endorses sensation to light touch about the foot.  Assessment: Postop day 1 status post cephalomedullary nail of right intertrochanteric hip fracture, doing well   Plan: He is doing well.  We will continue to work with physical therapy for mobilization and disposition recommendations.  He is allowed to be weightbearing as tolerated on the right hip.  Heparin for DVT prophylaxis for now.  On discharge would be suitable for aspirin.  He will follow-up with me in 2 weeks for x-rays of the right hip and suture removal if appropriate.   Radene Journey, MD

## 2020-11-09 NOTE — Progress Notes (Signed)
PROGRESS NOTE    [name]  ZLD:[id number] DOB: January 03, 1941 DOA: 11/07/2020 PCP: Elby Showers, MD    Brief Narrative:  80 year old gentleman with history of ESRD on hemodialysis Monday Wednesday Friday, hypertension, insulin-dependent diabetes, diabetic neuropathy, hypothyroidism presented to the ER with fall at home 2 days ago, unable to get up by himself.  Found to have right intertrochanteric femur fracture.   Assessment & Plan:   Principal Problem:   Closed nondisplaced intertrochanteric fracture of right femur (HCC) Active Problems:   Hyperlipidemia   Essential hypertension   Hypothyroidism   BPH (benign prostatic hyperplasia)   End stage renal disease (HCC)   OSA (obstructive sleep apnea)   Diabetes mellitus type 2 in nonobese (HCC)   Anemia   Pre-operative clearance  Closed nondisplaced intertrochanteric fracture of the right femur, traumatic: Status post ORIF 2/15 Weightbearing as tolerated DVT prophylaxis with heparin subcu, aspirin on discharge Pain control with Vicodin and hydromorphone along with laxatives. Refer to skilled nursing facility rehab as per PT OT recommendation.  ESRD on hemodialysis: Getting dialysis.  Anemia of chronic disease: Hemoglobin is stable.  Replace to ESA with hemodialysis.  Hyperlipidemia: Continue Zocor.  Essential hypertension: As needed hydralazine.  Sinus tachycardia: Resolved.  Type 2 diabetes: Fairly stable.  On sliding scale.  A1c 4.9.     DVT prophylaxis: heparin injection 5,000 Units Start: 11/09/20 0600 SCDs Start: 11/08/20 1229 SCDs Start: 11/07/20 1442   Code Status: Partial Family Communication: None today. Disposition Plan: Status is: Inpatient  Remains inpatient appropriate because:Unsafe d/c plan   Dispo: The patient is from: Home              Anticipated d/c is to: SNF              Anticipated d/c date is: 2 days              Patient currently is medically stable to d/c.   Difficult to place  patient No         Consultants:   Orthopedics  Nephrology  Procedures:   Right hip ORIF 2/15  Antimicrobials:   None, received perioperative antibiotics   Subjective: Patient seen and examined in the morning rounds.  No overnight events.  He is poor historian.  Denies any complaints.  Denies any pain.  Objective: Vitals:   11/09/20 1430 11/09/20 1500 11/09/20 1515 11/09/20 1528  BP: (!) 98/37 (!) 83/37 (!) 105/34 (!) 125/50  Pulse: 81 76 72 75  Resp: 16   19  Temp:    97.7 F (36.5 C)  TempSrc:    Oral  SpO2:    98%  Weight:    74.5 kg    Intake/Output Summary (Last 24 hours) at 11/09/2020 1552 Last data filed at 11/09/2020 1528 Gross per 24 hour  Intake 240 ml  Output 1948 ml  Net -1708 ml   Filed Weights   11/08/20 1955 11/09/20 1223 11/09/20 1528  Weight: 75 kg 76 kg 74.5 kg    Examination:  General exam: Appears comfortable. He has some cognitive dysfunction, confusion. Respiratory system: Bilateral clear. Cardiovascular system: S1 & S2 heard, RRR.  Gastrointestinal system: Soft and nontender. Central nervous system: Alert and oriented. No focal neurological deficits. Extremities: Symmetric 5 x 5 power. Skin: No rashes, lesions or ulcers Surgical incision clean and dry. Psychiatry: Judgement and insight appear normal. Mood & affect flat. Right upper extremity AV fistula present.    Data Reviewed: I have personally reviewed following labs  and imaging studies  CBC: Recent Labs  Lab 11/07/20 1200 11/08/20 0319 11/09/20 0447  WBC 10.0 8.9 9.6  NEUTROABS 6.9  --   --   HGB 10.3* 9.1* 8.1*  HCT 30.8* 26.5* 24.7*  MCV 105.5* 103.9* 101.2*  PLT 163 157 951   Basic Metabolic Panel: Recent Labs  Lab 11/07/20 1200 11/08/20 0319 11/09/20 1235  NA 140 139 137  K 4.2 5.0 4.5  CL 101 102 98  CO2 26 23 26   GLUCOSE 134* 103* 98  BUN 49* 59* 39*  CREATININE 10.06* 10.97* 7.83*  CALCIUM 9.8 9.5 9.2  PHOS  --  4.5 5.4*   GFR: Estimated  Creatinine Clearance: 8.1 mL/min (A) (by C-G formula based on SCr of 7.83 mg/dL (H)). Liver Function Tests: Recent Labs  Lab 11/07/20 1200 11/09/20 1235  AST 21  --   ALT 12  --   ALKPHOS 83  --   BILITOT 0.6  --   PROT 6.0*  --   ALBUMIN 2.9* 2.2*   No results for input(s): LIPASE, AMYLASE in the last 168 hours. No results for input(s): AMMONIA in the last 168 hours. Coagulation Profile: No results for input(s): INR, PROTIME in the last 168 hours. Cardiac Enzymes: No results for input(s): CKTOTAL, CKMB, CKMBINDEX, TROPONINI in the last 168 hours. BNP (last 3 results) No results for input(s): PROBNP in the last 8760 hours. HbA1C: Recent Labs    11/08/20 0319  HGBA1C 4.9   CBG: Recent Labs  Lab 11/08/20 1234 11/08/20 1617 11/08/20 2050 11/09/20 0653 11/09/20 1148  GLUCAP 124* 152* 106* 79 98   Lipid Profile: No results for input(s): CHOL, HDL, LDLCALC, TRIG, CHOLHDL, LDLDIRECT in the last 72 hours. Thyroid Function Tests: No results for input(s): TSH, T4TOTAL, FREET4, T3FREE, THYROIDAB in the last 72 hours. Anemia Panel: No results for input(s): VITAMINB12, FOLATE, FERRITIN, TIBC, IRON, RETICCTPCT in the last 72 hours. Sepsis Labs: No results for input(s): PROCALCITON, LATICACIDVEN in the last 168 hours.  Recent Results (from the past 240 hour(s))  Resp Panel by RT-PCR (Flu A&B, Covid) Nasopharyngeal Swab     Status: None   Collection Time: 11/07/20  1:38 PM   Specimen: Nasopharyngeal Swab; Nasopharyngeal(NP) swabs in vial transport medium  Result Value Ref Range Status   SARS Coronavirus 2 by RT PCR NEGATIVE NEGATIVE Final    Comment: (NOTE) SARS-CoV-2 target nucleic acids are NOT DETECTED.  The SARS-CoV-2 RNA is generally detectable in upper respiratory specimens during the acute phase of infection. The lowest concentration of SARS-CoV-2 viral copies this assay can detect is 138 copies/mL. A negative result does not preclude SARS-Cov-2 infection and should  not be used as the sole basis for treatment or other patient management decisions. A negative result may occur with  improper specimen collection/handling, submission of specimen other than nasopharyngeal swab, presence of viral mutation(s) within the areas targeted by this assay, and inadequate number of viral copies(<138 copies/mL). A negative result must be combined with clinical observations, patient history, and epidemiological information. The expected result is Negative.  Fact Sheet for Patients:  EntrepreneurPulse.com.au  Fact Sheet for Healthcare Providers:  IncredibleEmployment.be  This test is no t yet approved or cleared by the [place] FDA and  has been authorized for detection and/or diagnosis of SARS-CoV-2 by FDA under an Emergency Use Authorization (EUA). This EUA will remain  in effect (meaning this test can be used) for the duration of the COVID-19 declaration under Section 564(b)(1) of the Act, 21  U.S.C.section 360bbb-3(b)(1), unless the authorization is terminated  or revoked sooner.       Influenza A by PCR NEGATIVE NEGATIVE Final   Influenza B by PCR NEGATIVE NEGATIVE Final    Comment: (NOTE) The Xpert Xpress SARS-CoV-2/FLU/RSV plus assay is intended as an aid in the diagnosis of influenza from Nasopharyngeal swab specimens and should not be used as a sole basis for treatment. Nasal washings and aspirates are unacceptable for Xpert Xpress SARS-CoV-2/FLU/RSV testing.  Fact Sheet for Patients: EntrepreneurPulse.com.au  Fact Sheet for Healthcare Providers: IncredibleEmployment.be  This test is not yet approved or cleared by the [place] FDA and has been authorized for detection and/or diagnosis of SARS-CoV-2 by FDA under an Emergency Use Authorization (EUA). This EUA will remain in effect (meaning this test can be used) for the duration of the COVID-19 declaration under  Section 564(b)(1) of the Act, 21 U.S.C. section 360bbb-3(b)(1), unless the authorization is terminated or revoked.  Performed at [facility], [place] [address] [place], [place] [address]   Surgical pcr screen     Status: None   Collection Time: 11/08/20 12:13 AM   Specimen: Nasal Mucosa; Nasal Swab  Result Value Ref Range Status   MRSA, PCR NEGATIVE NEGATIVE Final   Staphylococcus aureus NEGATIVE NEGATIVE Final    Comment: (NOTE) The Xpert SA Assay (FDA approved for NASAL specimens in patients 13 years of age and older), is one component of a comprehensive surveillance program. It is not intended to diagnose infection nor to guide or monitor treatment. Performed at [facility], [place] [address] [place],  [address]          Radiology Studies: DG C-Arm 1-60 Min  Result Date: 11/08/2020 CLINICAL DATA:  Surgery, elective. Additional history provided: Right troch nail. Provided fluoroscopy time 59 seconds (15.02 mGy). EXAM: OPERATIVE right HIP (WITH PELVIS IF PERFORMED) 6 VIEWS TECHNIQUE: Fluoroscopic spot image(s) were submitted for interpretation post-operatively. COMPARISON:  Radiographs of the right hip 11/07/2020. FINDINGS: Six intraoperative fluoroscopic images of the right hip/femur are submitted. The images demonstrate a femoral intramedullary nail with proximal and distal interlocking screws, traversing a known intertrochanteric right hip fracture. Alignment appears near anatomic. IMPRESSION: Six intraoperative fluoroscopic images from right femoral ORIF for an intratrochanteric right hip fracture, as described. Electronically Signed   By: Kellie Simmering DO   On: 11/08/2020 12:51   ECHOCARDIOGRAM COMPLETE  Result Date: 11/07/2020    ECHOCARDIOGRAM REPORT   Patient Name:   [name] Date of Exam: 11/07/2020 Medical Rec #:  [id number]      Height:       71.0 in Accession #:    [id number]     Weight:       164.2 lb Date of Birth:  02-Aug-1941      BSA:           1.939 m Patient Age:    71 years       BP:           156/59 mmHg Patient Gender: M              HR:           73 bpm. Exam Location:  Inpatient Procedure: 2D Echo, Cardiac Doppler and Color Doppler Indications:    CHF-Acute Diastolic G28.36  History:        Patient has prior history of Echocardiogram examinations, most  recent 04/29/2018. CAD, PAD, Signs/Symptoms:Dyspnea; Risk                 Factors:Diabetes, Hypertension, Dyslipidemia and Sleep Apnea.                 Thyroid disease. Renal insufficiency. End stage renal disease.                 Mild dementia. Hip fracture.  Sonographer:    Darlina Sicilian RDCS Referring Phys: [id number] [place]  1. Left ventricular ejection fraction, by estimation, is 55 to 60%. The left ventricle has normal function. The left ventricle has no regional wall motion abnormalities. There is mild left ventricular hypertrophy. Left ventricular diastolic parameters are consistent with Grade I diastolic dysfunction (impaired relaxation).  2. Right ventricular systolic function is normal. The right ventricular size is normal. There is mildly elevated pulmonary artery systolic pressure. The estimated right ventricular systolic pressure is 29.5 mmHg.  3. The mitral valve is normal in structure. Trivial mitral valve regurgitation. No evidence of mitral stenosis.  4. The aortic valve is tricuspid. Aortic valve regurgitation is not visualized. Mild aortic valve sclerosis is present, with no evidence of aortic valve stenosis.  5. The inferior vena cava is normal in size with greater than 50% respiratory variability, suggesting right atrial pressure of 3 mmHg. FINDINGS  Left Ventricle: Left ventricular ejection fraction, by estimation, is 55 to 60%. The left ventricle has normal function. The left ventricle has no regional wall motion abnormalities. The left ventricular internal cavity size was normal in size. There is  mild left ventricular hypertrophy. Left  ventricular diastolic parameters are consistent with Grade I diastolic dysfunction (impaired relaxation). Right Ventricle: The right ventricular size is normal. No increase in right ventricular wall thickness. Right ventricular systolic function is normal. There is mildly elevated pulmonary artery systolic pressure. The tricuspid regurgitant velocity is 3.05  m/s, and with an assumed right atrial pressure of 3 mmHg, the estimated right ventricular systolic pressure is 62.1 mmHg. Left Atrium: Left atrial size was normal in size. Right Atrium: Right atrial size was normal in size. Pericardium: There is no evidence of pericardial effusion. Mitral Valve: The mitral valve is normal in structure. Trivial mitral valve regurgitation. No evidence of mitral valve stenosis. Tricuspid Valve: The tricuspid valve is normal in structure. Tricuspid valve regurgitation is mild. Aortic Valve: The aortic valve is tricuspid. Aortic valve regurgitation is not visualized. Mild aortic valve sclerosis is present, with no evidence of aortic valve stenosis. Pulmonic Valve: The pulmonic valve was normal in structure. Pulmonic valve regurgitation is not visualized. Aorta: The aortic root is normal in size and structure. Venous: The inferior vena cava is normal in size with greater than 50% respiratory variability, suggesting right atrial pressure of 3 mmHg. IAS/Shunts: No atrial level shunt detected by color flow Doppler.  LEFT VENTRICLE PLAX 2D LVIDd:         3.40 cm  Diastology LVIDs:         2.40 cm  LV e' medial:    7.07 cm/s LV PW:         1.20 cm  LV E/e' medial:  13.0 LV IVS:        1.50 cm  LV e' lateral:   8.59 cm/s LVOT diam:     1.90 cm  LV E/e' lateral: 10.7 LV SV:         83 LV SV Index:   43 LVOT Area:  2.84 cm  RIGHT VENTRICLE RV S prime:     17.90 cm/s TAPSE (M-mode): 2.1 cm LEFT ATRIUM             Index       RIGHT ATRIUM          Index LA diam:        2.75 cm 1.42 cm/m  RA Area:     9.54 cm LA Vol (A2C):   33.0 ml 17.02  ml/m RA Volume:   16.20 ml 8.35 ml/m LA Vol (A4C):   46.8 ml 24.13 ml/m LA Biplane Vol: 41.1 ml 21.19 ml/m  AORTIC VALVE LVOT Vmax:   127.00 cm/s LVOT Vmean:  86.800 cm/s LVOT VTI:    0.291 m  AORTA Ao Root diam: 3.60 cm Ao Asc diam:  3.40 cm MITRAL VALVE                TRICUSPID VALVE MV Area (PHT): 4.41 cm     TR Peak grad:   37.2 mmHg MV Decel Time: 172 msec     TR Vmax:        305.00 cm/s MV E velocity: 91.80 cm/s MV A velocity: 122.00 cm/s  SHUNTS MV E/A ratio:  0.75         Systemic VTI:  0.29 m                             Systemic Diam: 1.90 cm Loralie Champagne MD Electronically signed by Loralie Champagne MD Signature Date/Time: 11/07/2020/5:40:54 PM    Final    DG HIP OPERATIVE UNILAT W OR W/O PELVIS RIGHT  Result Date: 11/08/2020 CLINICAL DATA:  Surgery, elective. Additional history provided: Right troch nail. Provided fluoroscopy time 59 seconds (15.02 mGy). EXAM: OPERATIVE right HIP (WITH PELVIS IF PERFORMED) 6 VIEWS TECHNIQUE: Fluoroscopic spot image(s) were submitted for interpretation post-operatively. COMPARISON:  Radiographs of the right hip 11/07/2020. FINDINGS: Six intraoperative fluoroscopic images of the right hip/femur are submitted. The images demonstrate a femoral intramedullary nail with proximal and distal interlocking screws, traversing a known intertrochanteric right hip fracture. Alignment appears near anatomic. IMPRESSION: Six intraoperative fluoroscopic images from right femoral ORIF for an intratrochanteric right hip fracture, as described. Electronically Signed   By: Kellie Simmering DO   On: 11/08/2020 12:51        Scheduled Meds: . allopurinol  100 mg Oral Daily  . Chlorhexidine Gluconate Cloth  6 each Topical Q0600  . docusate sodium  100 mg Oral BID  . donepezil  10 mg Oral QHS  . feeding supplement (NEPRO CARB STEADY)  237 mL Oral TID WC  . fluticasone  1 spray Each Nare Daily  . gabapentin  100 mg Oral QHS  . heparin injection (subcutaneous)  5,000 Units  Subcutaneous Q8H  . insulin aspart  0-6 Units Subcutaneous TID WC  . levothyroxine  50 mcg Oral QAC breakfast  . linagliptin  5 mg Oral Daily  . loratadine  10 mg Oral Daily  . memantine  10 mg Oral BID  . midodrine  10 mg Oral Q M,W,F-HD  . multivitamin  1 tablet Oral QHS  . pantoprazole  40 mg Oral Daily  . sevelamer carbonate  2,400 mg Oral 3 times per day on Sun Tue Thu Sat  . sevelamer carbonate  2,400 mg Oral 2 times per day on Mon Wed Fri  . simvastatin  20 mg Oral QHS   Continuous Infusions:  LOS: 2 days    Time spent: 32 minutes    Barb Merino, MD [facility] Pager [phone]

## 2020-11-09 NOTE — Procedures (Signed)
Patient seen on Hemodialysis. BP (!) 98/35   Pulse 82   Temp 98.9 F (37.2 C) (Oral)   Resp 14   Wt 76 kg   SpO2 97%   BMI 23.37 kg/m   QB 400, UF goal 2L Tolerating treatment without complaints at this time.  Elmarie Shiley MD [facility]. Office # [phone] Pager # [phone] 2:21 PM

## 2020-11-09 NOTE — Progress Notes (Signed)
[facility] Progress Note   Subjective:   Patient seen and examined at bedside.  Alert but not oriented.  Does not remember having dialysis yesterday after surgery. Developed tachycardia vs A fib near the end of HD yesterday, back in NSR this AM.  Denies CP, SOB, abdominal pain, and n/v/d.  Reports hip pain mostly well controlled.     Objective Vitals:   11/08/20 1955 11/08/20 2049 11/09/20 0038 11/09/20 0340  BP: 102/77 (!) 121/95 104/62 (!) 109/49  Pulse:  94 84 80  Resp: (!) 23 16 16 17   Temp: 99.3 F (37.4 C) 98.2 F (36.8 C) 98.4 F (36.9 C) 98.6 F (37 C)  TempSrc: Oral Oral Oral Oral  SpO2:  100% 100% 96%  Weight: 75 kg      Physical Exam General:WNWD, pleasantly confused, elderly male in NAD Heart:RRR, no mrg Lungs:CTAB, nml WOB on RA Abdomen:soft, NTND, +BS, midline scar Extremities:no LE edema Dialysis Access: RU AVF +b   Filed Weights   11/08/20 1228 11/08/20 1650 11/08/20 1955  Weight: 74.5 kg 75.5 kg 75 kg    Intake/Output Summary (Last 24 hours) at 11/09/2020 0933 Last data filed at 11/08/2020 1955 Gross per 24 hour  Intake 54.5 ml  Output 500 ml  Net -445.5 ml    Additional Objective Labs: Basic Metabolic Panel: Recent Labs  Lab 11/07/20 1200 11/08/20 0319  NA 140 139  K 4.2 5.0  CL 101 102  CO2 26 23  GLUCOSE 134* 103*  BUN 49* 59*  CREATININE 10.06* 10.97*  CALCIUM 9.8 9.5   Liver Function Tests: Recent Labs  Lab 11/07/20 1200  AST 21  ALT 12  ALKPHOS 83  BILITOT 0.6  PROT 6.0*  ALBUMIN 2.9*   CBC: Recent Labs  Lab 11/07/20 1200 11/08/20 0319 11/09/20 0447  WBC 10.0 8.9 9.6  NEUTROABS 6.9  --   --   HGB 10.3* 9.1* 8.1*  HCT 30.8* 26.5* 24.7*  MCV 105.5* 103.9* 101.2*  PLT 163 157 168   CBG: Recent Labs  Lab 11/08/20 1134 11/08/20 1234 11/08/20 1617 11/08/20 2050 11/09/20 0653  GLUCAP 111* 124* 152* 106* 79   Studies/Results: DG Chest 1 View  Result Date: 11/07/2020 CLINICAL DATA:  Pain  following fall EXAM: CHEST  1 VIEW COMPARISON:  May 26, 2020 FINDINGS: There is slight left base atelectasis. Lungs elsewhere clear. Heart size and pulmonary vascularity are within normal limits. There is aortic atherosclerosis. No evident adenopathy. No bone lesions. IMPRESSION: Slight left base atelectasis. Lungs otherwise clear. Heart size normal. Aortic Atherosclerosis (ICD10-I70.0). Electronically Signed   By: Lowella Grip III M.D.   On: 11/07/2020 11:51   CT Head Wo Contrast  Result Date: 11/07/2020 CLINICAL DATA:  Fall. EXAM: CT HEAD WITHOUT CONTRAST CT CERVICAL SPINE WITHOUT CONTRAST TECHNIQUE: Multidetector CT imaging of the head and cervical spine was performed following the standard protocol without intravenous contrast. Multiplanar CT image reconstructions of the cervical spine were also generated. COMPARISON:  May 26, 2020. FINDINGS: CT HEAD FINDINGS Brain: Stable bilateral frontal chronic hygromas are noted, left greater than right. Mild diffuse cortical atrophy is noted. Ventricular size is within normal limits. No evidence of acute hemorrhage, infarction or intracranial mass lesion is noted. Vascular: No hyperdense vessel or unexpected calcification. Skull: Status post bilateral frontal craniotomies. No acute osseous abnormality is noted. Sinuses/Orbits: No acute finding. Other: None. CT CERVICAL SPINE FINDINGS Alignment: Normal. Skull base and vertebrae: Old C7 fracture is noted. No acute fracture is noted. Soft  tissues and spinal canal: No prevertebral fluid or swelling. No visible canal hematoma. Disc levels: Moderate degenerative disc disease is noted at C3-4, C4-5, C5-6, C6-7 and C7-T1. Upper chest: Negative. Other: None. IMPRESSION: 1. Stable bilateral frontal chronic hygromas are noted, left greater than right. Mild diffuse cortical atrophy. No acute intracranial abnormality seen. 2. Multilevel degenerative disc disease. No acute abnormality seen in the cervical spine.  Electronically Signed   By: Marijo Conception M.D.   On: 11/07/2020 12:00   CT Cervical Spine Wo Contrast  Result Date: 11/07/2020 CLINICAL DATA:  Fall. EXAM: CT HEAD WITHOUT CONTRAST CT CERVICAL SPINE WITHOUT CONTRAST TECHNIQUE: Multidetector CT imaging of the head and cervical spine was performed following the standard protocol without intravenous contrast. Multiplanar CT image reconstructions of the cervical spine were also generated. COMPARISON:  May 26, 2020. FINDINGS: CT HEAD FINDINGS Brain: Stable bilateral frontal chronic hygromas are noted, left greater than right. Mild diffuse cortical atrophy is noted. Ventricular size is within normal limits. No evidence of acute hemorrhage, infarction or intracranial mass lesion is noted. Vascular: No hyperdense vessel or unexpected calcification. Skull: Status post bilateral frontal craniotomies. No acute osseous abnormality is noted. Sinuses/Orbits: No acute finding. Other: None. CT CERVICAL SPINE FINDINGS Alignment: Normal. Skull base and vertebrae: Old C7 fracture is noted. No acute fracture is noted. Soft tissues and spinal canal: No prevertebral fluid or swelling. No visible canal hematoma. Disc levels: Moderate degenerative disc disease is noted at C3-4, C4-5, C5-6, C6-7 and C7-T1. Upper chest: Negative. Other: None. IMPRESSION: 1. Stable bilateral frontal chronic hygromas are noted, left greater than right. Mild diffuse cortical atrophy. No acute intracranial abnormality seen. 2. Multilevel degenerative disc disease. No acute abnormality seen in the cervical spine. Electronically Signed   By: Marijo Conception M.D.   On: 11/07/2020 12:00   DG C-Arm 1-60 Min  Result Date: 11/08/2020 CLINICAL DATA:  Surgery, elective. Additional history provided: Right troch nail. Provided fluoroscopy time 59 seconds (15.02 mGy). EXAM: OPERATIVE right HIP (WITH PELVIS IF PERFORMED) 6 VIEWS TECHNIQUE: Fluoroscopic spot image(s) were submitted for interpretation  post-operatively. COMPARISON:  Radiographs of the right hip 11/07/2020. FINDINGS: Six intraoperative fluoroscopic images of the right hip/femur are submitted. The images demonstrate a femoral intramedullary nail with proximal and distal interlocking screws, traversing a known intertrochanteric right hip fracture. Alignment appears near anatomic. IMPRESSION: Six intraoperative fluoroscopic images from right femoral ORIF for an intratrochanteric right hip fracture, as described. Electronically Signed   By: Kellie Simmering DO   On: 11/08/2020 12:51   ECHOCARDIOGRAM COMPLETE  Result Date: 11/07/2020    ECHOCARDIOGRAM REPORT   Patient Name:   [name] Salzman Date of Exam: 11/07/2020 Medical Rec #:  [id number]      Height:       71.0 in Accession #:    [id number]     Weight:       164.2 lb Date of Birth:  07/18/1941      BSA:          1.939 m Patient Age:    64 years       BP:           156/59 mmHg Patient Gender: M              HR:           73 bpm. Exam Location:  Inpatient Procedure: 2D Echo, Cardiac Doppler and Color Doppler Indications:    CHF-Acute Diastolic T26.71  History:  Patient has prior history of Echocardiogram examinations, most                 recent 04/29/2018. CAD, PAD, Signs/Symptoms:Dyspnea; Risk                 Factors:Diabetes, Hypertension, Dyslipidemia and Sleep Apnea.                 Thyroid disease. Renal insufficiency. End stage renal disease.                 Mild dementia. Hip fracture.  Sonographer:    Darlina Sicilian RDCS Referring Phys: [id number] [place]  1. Left ventricular ejection fraction, by estimation, is 55 to 60%. The left ventricle has normal function. The left ventricle has no regional wall motion abnormalities. There is mild left ventricular hypertrophy. Left ventricular diastolic parameters are consistent with Grade I diastolic dysfunction (impaired relaxation).  2. Right ventricular systolic function is normal. The right ventricular size is normal. There is  mildly elevated pulmonary artery systolic pressure. The estimated right ventricular systolic pressure is 20.2 mmHg.  3. The mitral valve is normal in structure. Trivial mitral valve regurgitation. No evidence of mitral stenosis.  4. The aortic valve is tricuspid. Aortic valve regurgitation is not visualized. Mild aortic valve sclerosis is present, with no evidence of aortic valve stenosis.  5. The inferior vena cava is normal in size with greater than 50% respiratory variability, suggesting right atrial pressure of 3 mmHg. FINDINGS  Left Ventricle: Left ventricular ejection fraction, by estimation, is 55 to 60%. The left ventricle has normal function. The left ventricle has no regional wall motion abnormalities. The left ventricular internal cavity size was normal in size. There is  mild left ventricular hypertrophy. Left ventricular diastolic parameters are consistent with Grade I diastolic dysfunction (impaired relaxation). Right Ventricle: The right ventricular size is normal. No increase in right ventricular wall thickness. Right ventricular systolic function is normal. There is mildly elevated pulmonary artery systolic pressure. The tricuspid regurgitant velocity is 3.05  m/s, and with an assumed right atrial pressure of 3 mmHg, the estimated right ventricular systolic pressure is 54.2 mmHg. Left Atrium: Left atrial size was normal in size. Right Atrium: Right atrial size was normal in size. Pericardium: There is no evidence of pericardial effusion. Mitral Valve: The mitral valve is normal in structure. Trivial mitral valve regurgitation. No evidence of mitral valve stenosis. Tricuspid Valve: The tricuspid valve is normal in structure. Tricuspid valve regurgitation is mild. Aortic Valve: The aortic valve is tricuspid. Aortic valve regurgitation is not visualized. Mild aortic valve sclerosis is present, with no evidence of aortic valve stenosis. Pulmonic Valve: The pulmonic valve was normal in structure.  Pulmonic valve regurgitation is not visualized. Aorta: The aortic root is normal in size and structure. Venous: The inferior vena cava is normal in size with greater than 50% respiratory variability, suggesting right atrial pressure of 3 mmHg. IAS/Shunts: No atrial level shunt detected by color flow Doppler.  LEFT VENTRICLE PLAX 2D LVIDd:         3.40 cm  Diastology LVIDs:         2.40 cm  LV e' medial:    7.07 cm/s LV PW:         1.20 cm  LV E/e' medial:  13.0 LV IVS:        1.50 cm  LV e' lateral:   8.59 cm/s LVOT diam:     1.90 cm  LV E/e'  lateral: 10.7 LV SV:         83 LV SV Index:   43 LVOT Area:     2.84 cm  RIGHT VENTRICLE RV S prime:     17.90 cm/s TAPSE (M-mode): 2.1 cm LEFT ATRIUM             Index       RIGHT ATRIUM          Index LA diam:        2.75 cm 1.42 cm/m  RA Area:     9.54 cm LA Vol (A2C):   33.0 ml 17.02 ml/m RA Volume:   16.20 ml 8.35 ml/m LA Vol (A4C):   46.8 ml 24.13 ml/m LA Biplane Vol: 41.1 ml 21.19 ml/m  AORTIC VALVE LVOT Vmax:   127.00 cm/s LVOT Vmean:  86.800 cm/s LVOT VTI:    0.291 m  AORTA Ao Root diam: 3.60 cm Ao Asc diam:  3.40 cm MITRAL VALVE                TRICUSPID VALVE MV Area (PHT): 4.41 cm     TR Peak grad:   37.2 mmHg MV Decel Time: 172 msec     TR Vmax:        305.00 cm/s MV E velocity: 91.80 cm/s MV A velocity: 122.00 cm/s  SHUNTS MV E/A ratio:  0.75         Systemic VTI:  0.29 m                             Systemic Diam: 1.90 cm Loralie Champagne MD Electronically signed by Loralie Champagne MD Signature Date/Time: 11/07/2020/5:40:54 PM    Final    DG HIP OPERATIVE UNILAT W OR W/O PELVIS RIGHT  Result Date: 11/08/2020 CLINICAL DATA:  Surgery, elective. Additional history provided: Right troch nail. Provided fluoroscopy time 59 seconds (15.02 mGy). EXAM: OPERATIVE right HIP (WITH PELVIS IF PERFORMED) 6 VIEWS TECHNIQUE: Fluoroscopic spot image(s) were submitted for interpretation post-operatively. COMPARISON:  Radiographs of the right hip 11/07/2020. FINDINGS: Six  intraoperative fluoroscopic images of the right hip/femur are submitted. The images demonstrate a femoral intramedullary nail with proximal and distal interlocking screws, traversing a known intertrochanteric right hip fracture. Alignment appears near anatomic. IMPRESSION: Six intraoperative fluoroscopic images from right femoral ORIF for an intratrochanteric right hip fracture, as described. Electronically Signed   By: Kellie Simmering DO   On: 11/08/2020 12:51   DG Hip Unilat W or Wo Pelvis 2-3 Views Right  Result Date: 11/07/2020 CLINICAL DATA:  Pain following fall EXAM: DG HIP (WITH OR WITHOUT PELVIS) 2-3V RIGHT COMPARISON:  None. FINDINGS: Frontal pelvis as well as frontal and lateral right hip images were obtained. There is an intertrochanteric femur fracture on the right with slight impaction at the fracture site. No other fracture. No dislocation. There is severe narrowing of the right hip joint with moderate narrowing of the left hip joint. No erosion. There is extensive distal aortic and iliac artery atherosclerotic calcification. There is also common femoral and superficial femoral artery atherosclerotic calcification bilaterally. There are surgical clips in each inguinal region. IMPRESSION: Intertrochanteric femur fracture on the right with mild impaction at the fracture site. No other acute fracture. No dislocation. 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 There is multifocal atherosclerotic arterial vascular calcification. Electronically Signed   By: Lowella Grip III M.D.  On: 11/07/2020 11:47    Medications: . vancomycin     . allopurinol  100 mg Oral Daily  . Chlorhexidine Gluconate Cloth  6 each Topical Q0600  . docusate sodium  100 mg Oral BID  . donepezil  10 mg Oral QHS  . feeding supplement (NEPRO CARB STEADY)  237 mL Oral TID WC  . fluticasone  1 spray Each Nare Daily  . gabapentin  100 mg Oral QHS  . heparin injection  (subcutaneous)  5,000 Units Subcutaneous Q8H  . insulin aspart  0-6 Units Subcutaneous TID WC  . levothyroxine  50 mcg Oral QAC breakfast  . linagliptin  5 mg Oral Daily  . loratadine  10 mg Oral Daily  . memantine  10 mg Oral BID  . midodrine  10 mg Oral Q M,W,F-HD  . multivitamin  1 tablet Oral QHS  . pantoprazole  40 mg Oral Daily  . sevelamer carbonate  2,400 mg Oral 3 times per day on Sun Tue Thu Sat  . sevelamer carbonate  2,400 mg Oral 2 times per day on Mon Wed Fri  . simvastatin  20 mg Oral QHS    Dialysis Orders: MWF -[facility] 4hrs, BFR400, [id number], EDW 74kg,2K/2Ca  Access:RU AVF Heparinnone Mircera63mcg q2wks - last 136mcg on 10/28/20  Assessment/Plan: 1. R femur Frx - s/p cephalomedullary nailing of R intertrochanteric hip Frx yesterday by Dr. Lucia Gaskins. Pain well controlled.  2. ESRD- On HD MWF. HD yesterday off schedule.  Orders written for HD today per regular schedule.   3. Hypertension/volume- Blood pressure in goal. Continue home meds. On midodrine with HD. Does not appear volume overloaded. UF as tolerated.  4. Anemiaof CKD- Hgb drop 8.1 post surgery. ESA due Friday, will order.   5. Secondary Hyperparathyroidism -Corrected Ca high. Not on VDRA, use low Ca bath. Will check phos. Continue Renvela.  6. Nutrition- Renal diet with fluid restrictions. Protein supplements. Renal vitamin.  7. DM - per primary 8. Hypothyroidism- on synthroid.  9. Gout 10. HLD 11. DOE - eval by cardiology. Echo with EF 50-55%, G1DD. Plan for OP follow up.    Jen Mow, PA-C [facility] 11/09/2020,9:33 AM  LOS: 2 days

## 2020-11-10 DIAGNOSIS — S72144A Nondisplaced intertrochanteric fracture of right femur, initial encounter for closed fracture: Secondary | ICD-10-CM | POA: Diagnosis not present

## 2020-11-10 LAB — CBC
HCT: 23.8 % — ABNORMAL LOW (ref 39.0–52.0)
Hemoglobin: 8.2 g/dL — ABNORMAL LOW (ref 13.0–17.0)
MCH: 34.9 pg — ABNORMAL HIGH (ref 26.0–34.0)
MCHC: 34.5 g/dL (ref 30.0–36.0)
MCV: 101.3 fL — ABNORMAL HIGH (ref 80.0–100.0)
Platelets: 191 10*3/uL (ref 150–400)
RBC: 2.35 MIL/uL — ABNORMAL LOW (ref 4.22–5.81)
RDW: 16.3 % — ABNORMAL HIGH (ref 11.5–15.5)
WBC: 11.1 10*3/uL — ABNORMAL HIGH (ref 4.0–10.5)
nRBC: 0 % (ref 0.0–0.2)

## 2020-11-10 LAB — GLUCOSE, CAPILLARY
Glucose-Capillary: 103 mg/dL — ABNORMAL HIGH (ref 70–99)
Glucose-Capillary: 107 mg/dL — ABNORMAL HIGH (ref 70–99)
Glucose-Capillary: 109 mg/dL — ABNORMAL HIGH (ref 70–99)
Glucose-Capillary: 143 mg/dL — ABNORMAL HIGH (ref 70–99)
Glucose-Capillary: 153 mg/dL — ABNORMAL HIGH (ref 70–99)
Glucose-Capillary: 189 mg/dL — ABNORMAL HIGH (ref 70–99)

## 2020-11-10 MED ORDER — DARBEPOETIN ALFA 100 MCG/0.5ML IJ SOSY
100.0000 ug | PREFILLED_SYRINGE | INTRAMUSCULAR | Status: DC
  Administered 2020-11-11: 100 ug via INTRAVENOUS
  Filled 2020-11-10: qty 0.5

## 2020-11-10 MED ORDER — METOPROLOL TARTRATE 5 MG/5ML IV SOLN
5.0000 mg | Freq: Once | INTRAVENOUS | Status: AC
  Administered 2020-11-10: 5 mg via INTRAVENOUS
  Filled 2020-11-10: qty 5

## 2020-11-10 MED ORDER — ASPIRIN EC 325 MG PO TBEC: 325.0000 mg | DELAYED_RELEASE_TABLET | Freq: Every day | ORAL | 11 refills | Status: DC

## 2020-11-10 MED ORDER — DOCUSATE SODIUM 100 MG PO CAPS: 100.0000 mg | ORAL_CAPSULE | Freq: Two times a day (BID) | ORAL | 0 refills | Status: DC

## 2020-11-10 MED ORDER — CHLORHEXIDINE GLUCONATE CLOTH 2 % EX PADS
6.0000 | MEDICATED_PAD | Freq: Every day | CUTANEOUS | Status: DC
  Administered 2020-11-10 – 2020-11-16 (×6): 6 via TOPICAL

## 2020-11-10 MED ORDER — HYDROCODONE-ACETAMINOPHEN 5-325 MG PO TABS: 1.0000 | ORAL_TABLET | Freq: Four times a day (QID) | ORAL | 0 refills | Status: AC | PRN

## 2020-11-10 NOTE — NC FL2 (Addendum)
[place] LEVEL OF CARE SCREENING TOOL     IDENTIFICATION  Patient Name: [name] Birthdate: 1941-08-25 Sex: male Admission Date (Current Location): 11/07/2020  [facility] and [place] Number:  Herbalist and Address:  [facility], [place] [address], [place], [place] [address]      Provider Number: [id number]  Attending Physician Name and Address:  Barb Merino, MD  Relative Name and Phone Number:  Hussain Maimone (Spouse)   [phone]    Current Level of Care: Hospital Recommended Level of Care: [place] Prior Approval Number:    Date Approved/Denied:   PASRR Number: [id number]  Discharge Plan: SNF    Current Diagnoses: Patient Active Problem List   Diagnosis Date Noted  . Pre-operative clearance   . Closed nondisplaced intertrochanteric fracture of right femur ([place]) 11/07/2020  . Hip fracture ([place]) 11/07/2020  . Fall   . Rectal bleeding 04/22/2020  . AVM (arteriovenous malformation) of small bowel, acquired   . Hiatal hernia   . Schatzki's ring of distal esophagus   . Melena 11/19/2018  . Leukocytosis   . Anemia   . TBI (traumatic brain injury) ([place]) 07/29/2018  . 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 Loose stools   . Dementia arising in the senium and presenium ([place])   . 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 Multiple fractures of ribs, left side, initial encounter for closed fracture 07/21/2018  . Subdural hematoma ([place]) 07/21/2018  . Angiodysplasia of stomach   . Occult GI bleeding   . GIB (gastrointestinal bleeding) 06/01/2018  . Low back pain 01/17/2018  . Anorexia   . Dyspnea   . Macrocytic anemia 06/04/2016  . Thrombocytopenia ([place]) 06/04/2016  . Exertional dyspnea 06/04/2016  . Arm paresthesia, left 06/04/2016  . Dyspnea on exertion 06/04/2016  . Tenderness of right calf 06/04/2016  . Mild cognitive impairment with memory loss 01/12/2016  .  ESRD on dialysis ([place]) 05/17/2015  . Hypoglycemia   . Weakness 02/18/2015  . OSA (obstructive sleep apnea) 02/02/2015  . Hyperkalemia 10/05/2014  . End stage renal disease ([place]) 11/18/2013  . BPH (benign prostatic hyperplasia) 11/22/2012  . Peripheral vascular disease ([place]) 12/24/2011  . Hyperparathyroidism ([place]) 12/24/2011  . Hypothyroidism 12/24/2011  . Allergic rhinitis 12/24/2011  . Erectile dysfunction 12/24/2011  . Insulin dependent diabetes mellitus 09/03/2008  . Hyperlipidemia 09/03/2008  . Essential hypertension 08/30/2008  . PANCREATITIS, HX OF 08/30/2008  . RENAL FAILURE, ACUTE, HX OF 08/30/2008  . DIVERTICULOSIS, COLON 06/08/2003    Orientation RESPIRATION BLADDER Height & Weight     Self,Place  Normal  (anuric, HD pt) Weight: 74.5 kg Height:     BEHAVIORAL SYMPTOMS/MOOD NEUROLOGICAL BOWEL NUTRITION STATUS      Continent Diet (refer to d/c summary)  AMBULATORY STATUS COMMUNICATION OF NEEDS Skin   Extensive Assist Verbally Surgical wounds (Pt with R intertrochanteric  fx  s/p R IM Nail on 2/15)                       Personal Care Assistance Level of Assistance  Bathing,Feeding,Dressing Bathing Assistance: Maximum assistance Feeding assistance: Limited assistance Dressing Assistance: Maximum assistance     Functional Limitations Info  Sight,Hearing,Speech Sight Info: Adequate Hearing Info: Adequate Speech Info: Adequate    SPECIAL CARE FACTORS FREQUENCY  PT (By licensed PT),OT (By licensed OT)     PT Frequency: 5x/week, evaluate and treat OT Frequency: 5x/week,  evaluate and treat            Contractures Contractures Info: Not present    Additional Factors Info  Code Status,Allergies,Insulin Sliding Scale Code status info: Partial Code, no Intubation/Mechanical Ventilation Allergies Info: Ambien (Zolpidem Tartrate), Penicillins   Insulin Sliding Scale Info: Novolog scliding scale three times a day with meals       Current Medications  (11/10/2020):  This is the current hospital active medication list Current Facility-Administered Medications  Medication Dose Route Frequency Provider Last Rate Last Admin  . acetaminophen (TYLENOL) tablet 325-650 mg  325-650 mg Oral Q4H PRN Erle Crocker, MD      . albuterol (PROVENTIL) (2.5 MG/3ML) 0.083% nebulizer solution 2.5 mg  2.5 mg Inhalation Daily PRN Erle Crocker, MD      . allopurinol (ZYLOPRIM) tablet 100 mg  100 mg Oral Daily Erle Crocker, MD   100 mg at 11/10/20 0835  . Chlorhexidine Gluconate Cloth 2 % PADS 6 each  6 each Topical Q0600 Erle Crocker, MD   6 each at 11/10/20 [phone]  . Chlorhexidine Gluconate Cloth 2 % PADS 6 each  6 each Topical Q0600 Penninger, [place], [place]   6 each at 11/10/20 [phone]  . [START ON 11/11/2020] Darbepoetin Alfa (ARANESP) injection 100 mcg  100 mcg Intravenous Q Fri-HD Penninger, [place], PA      . docusate sodium (COLACE) capsule 100 mg  100 mg Oral BID Erle Crocker, MD   100 mg at 11/10/20 0835  . donepezil (ARICEPT) tablet 10 mg  10 mg Oral QHS Erle Crocker, MD   10 mg at 11/09/20 2136  . feeding supplement (NEPRO CARB STEADY) liquid 237 mL  237 mL Oral TID WC Erle Crocker, MD   237 mL at 11/10/20 0836  . fluticasone (FLONASE) 50 MCG/ACT nasal spray 1 spray  1 spray Each Nare Daily Erle Crocker, MD   1 spray at 11/10/20 [phone]  . gabapentin (NEURONTIN) capsule 100 mg  100 mg Oral QHS Erle Crocker, MD   100 mg at 11/09/20 2136  . heparin injection 5,000 Units  5,000 Units Subcutaneous Q8H Erle Crocker, MD   5,000 Units at 11/10/20 0865  . hydrALAZINE (APRESOLINE) tablet 25 mg  25 mg Oral Q6H PRN Erle Crocker, MD   25 mg at 11/08/20 0030  . HYDROcodone-acetaminophen (NORCO/VICODIN) 5-325 MG per tablet 1-2 tablet  1-2 tablet Oral Q6H PRN Erle Crocker, MD   1 tablet at 11/08/20 0030  . HYDROmorphone (DILAUDID) injection 0.5 mg  0.5 mg Intravenous Q2H PRN Erle Crocker, MD   0.5 mg at 11/07/20 2146  . insulin aspart (novoLOG) injection 0-6 Units  0-6 Units Subcutaneous TID WC Erle Crocker, MD   1 Units at 11/09/20 1920  . levothyroxine (SYNTHROID) tablet 50 mcg  50 mcg Oral QAC breakfast Erle Crocker, MD   50 mcg at 11/10/20 7846  . lidocaine (LIDODERM) 5 % 1 patch  1 patch Transdermal Daily PRN Erle Crocker, MD      . linagliptin (TRADJENTA) tablet 5 mg  5 mg Oral Daily Erle Crocker, MD   5 mg at 11/10/20 0835  . loratadine (CLARITIN) tablet 10 mg  10 mg Oral Daily Erle Crocker, MD   10 mg at 11/10/20 0836  . memantine (NAMENDA) tablet 10 mg  10 mg Oral BID Erle Crocker, MD   10 mg at 11/10/20 0835  . menthol-cetylpyridinium (  CEPACOL) lozenge 3 mg  1 lozenge Oral PRN Erle Crocker, MD       Or  . phenol (CHLORASEPTIC) mouth spray 1 spray  1 spray Mouth/Throat PRN Erle Crocker, MD      . metoCLOPramide (REGLAN) tablet 5 mg  5 mg Oral Q8H PRN Erle Crocker, MD       Or  . metoCLOPramide (REGLAN) injection 5 mg  5 mg Intravenous Q8H PRN Erle Crocker, MD      . midodrine (PROAMATINE) tablet 10 mg  10 mg Oral Q M,W,F-HD Erle Crocker, MD   10 mg at 11/09/20 1405  . multivitamin (RENA-VIT) tablet 1 tablet  1 tablet Oral QHS Rai, Ripudeep K, MD   1 tablet at 11/09/20 2136  . ondansetron (ZOFRAN) tablet 4 mg  4 mg Oral Q6H PRN Erle Crocker, MD       Or  . ondansetron [facility]) injection 4 mg  4 mg Intravenous Q6H PRN Erle Crocker, MD      . pantoprazole (PROTONIX) EC tablet 40 mg  40 mg Oral Daily Erle Crocker, MD   40 mg at 11/10/20 0835  . sevelamer carbonate (RENVELA) tablet 1,600 mg  1,600 mg Oral PRN Erle Crocker, MD      . sevelamer carbonate (RENVELA) tablet 2,400 mg  2,400 mg Oral 3 times per day on Sun Tue Thu Sat Erle Crocker, MD   2,400 mg at 11/10/20 1941  . sevelamer carbonate (RENVELA) tablet 2,400 mg  2,400 mg Oral 2  times per day on Mon Wed Fri [place]air, Christopher R, MD      . simvastatin (ZOCOR) tablet 20 mg  20 mg Oral QHS Erle Crocker, MD   20 mg at 11/09/20 2136     Discharge Medications: Please see discharge summary for a list of discharge medications.  Relevant Imaging Results:  Relevant Lab Results:   Additional Information SSN# [id number]  , HD MWF /[facility] [place], Daivd [place], RN

## 2020-11-10 NOTE — Hospital Course (Signed)
Subjective: Patient seen this morning. He was awake, alert and speaking. He indicated he was not in much pain.   Objective: Vital signs in last 24 hours: Temp:  [97.7 F (36.5 C)-98.7 F (37.1 C)] 98.7 F (37.1 C) (02/17 0851) Pulse Rate:  [72-107] 80 (02/17 0851) Resp:  [14-19] 18 (02/17 0851) BP: (83-125)/(34-99) 119/56 (02/17 0851) SpO2:  [95 %-100 %] 97 % (02/17 0851) Weight:  [74.5 kg] 74.5 kg (02/16 1528)  Intake/Output from previous day: 02/16 0701 - 02/17 0700 In: 480 [P.O.:480] Out: 1448  Intake/Output this shift: No intake/output data recorded.  Recent Labs    11/08/20 0319 11/09/20 0447 11/10/20 0119  HGB 9.1* 8.1* 8.2*   Recent Labs    11/09/20 0447 11/10/20 0119  WBC 9.6 11.1*  RBC 2.44* 2.35*  HCT 24.7* 23.8*  PLT 168 191   Recent Labs    11/08/20 0319 11/09/20 1235  NA 139 137  K 5.0 4.5  CL 102 98  CO2 23 26  BUN 59* 39*  CREATININE 10.97* 7.83*  GLUCOSE 103* 98  CALCIUM 9.5 9.2   No results for input(s): LABPT, INR in the last 72 hours.  Bandages were intact without excessive swelling of the limb or drainage. Right leg was warm and well perfused with light touch intact.   Assessment/Plan: Continue under medicine's care. If bandages become more soiled, reinforce with additional dressings.    [name] 11/10/2020, 1:33 PM

## 2020-11-10 NOTE — Progress Notes (Signed)
Paged on call about pt's HR sustaining in the 140's.

## 2020-11-10 NOTE — Progress Notes (Signed)
Pt's HR now back in the 90's. On call NP stated to notify her if HR sustains higher than 120 for 10 min.

## 2020-11-10 NOTE — Progress Notes (Signed)
[facility] Progress Note   Subjective:   Patient seen and examined at bedside.  Episode of tachycardia/A fib overnight.  Denies CP, palpations, SOB and n/v/d.  Does not remember having dialysis yesterday.    Objective Vitals:   11/10/20 0226 11/10/20 0331 11/10/20 0400 11/10/20 0442  BP: (!) 105/41 (!) 120/39  (!) 118/99  Pulse: 91  (!) 107 100  Resp:    18  Temp:    98.4 F (36.9 C)  TempSrc:    Oral  SpO2:    95%  Weight:       Physical Exam General:WDWN, pleasantly confused, alert male in NAD Heart:RRR, no mrg Lungs:CTAB, nml WOB Abdomen:soft, NTND Extremities:no LE edema Dialysis Access: RU AVF +b   Filed Weights   11/08/20 1955 11/09/20 1223 11/09/20 1528  Weight: 75 kg 76 kg 74.5 kg    Intake/Output Summary (Last 24 hours) at 11/10/2020 0750 Last data filed at 11/09/2020 1754 Gross per 24 hour  Intake 480 ml  Output 1448 ml  Net -968 ml    Additional Objective Labs: Basic Metabolic Panel: Recent Labs  Lab 11/07/20 1200 11/08/20 0319 11/09/20 1235  NA 140 139 137  K 4.2 5.0 4.5  CL 101 102 98  CO2 26 23 26   GLUCOSE 134* 103* 98  BUN 49* 59* 39*  CREATININE 10.06* 10.97* 7.83*  CALCIUM 9.8 9.5 9.2  PHOS  --  4.5 5.4*   Liver Function Tests: Recent Labs  Lab 11/07/20 1200 11/09/20 1235  AST 21  --   ALT 12  --   ALKPHOS 83  --   BILITOT 0.6  --   PROT 6.0*  --   ALBUMIN 2.9* 2.2*   CBC: Recent Labs  Lab 11/07/20 1200 11/08/20 0319 11/09/20 0447 11/10/20 0119  WBC 10.0 8.9 9.6 11.1*  NEUTROABS 6.9  --   --   --   HGB 10.3* 9.1* 8.1* 8.2*  HCT 30.8* 26.5* 24.7* 23.8*  MCV 105.5* 103.9* 101.2* 101.3*  PLT 163 157 168 191   CBG: Recent Labs  Lab 11/09/20 1637 11/09/20 1918 11/10/20 0008 11/10/20 0443 11/10/20 0625  GLUCAP 85 169* 103* 109* 107*   Studies/Results: DG C-Arm 1-60 Min  Result Date: 11/08/2020 CLINICAL DATA:  Surgery, elective. Additional history provided: Right troch nail. Provided fluoroscopy  time 59 seconds (15.02 mGy). EXAM: OPERATIVE right HIP (WITH PELVIS IF PERFORMED) 6 VIEWS TECHNIQUE: Fluoroscopic spot image(s) were submitted for interpretation post-operatively. COMPARISON:  Radiographs of the right hip 11/07/2020. FINDINGS: Six intraoperative fluoroscopic images of the right hip/femur are submitted. The images demonstrate a femoral intramedullary nail with proximal and distal interlocking screws, traversing a known intertrochanteric right hip fracture. Alignment appears near anatomic. IMPRESSION: Six intraoperative fluoroscopic images from right femoral ORIF for an intratrochanteric right hip fracture, as described. Electronically Signed   By: Kellie Simmering DO   On: 11/08/2020 12:51   DG HIP OPERATIVE UNILAT W OR W/O PELVIS RIGHT  Result Date: 11/08/2020 CLINICAL DATA:  Surgery, elective. Additional history provided: Right troch nail. Provided fluoroscopy time 59 seconds (15.02 mGy). EXAM: OPERATIVE right HIP (WITH PELVIS IF PERFORMED) 6 VIEWS TECHNIQUE: Fluoroscopic spot image(s) were submitted for interpretation post-operatively. COMPARISON:  Radiographs of the right hip 11/07/2020. FINDINGS: Six intraoperative fluoroscopic images of the right hip/femur are submitted. The images demonstrate a femoral intramedullary nail with proximal and distal interlocking screws, traversing a known intertrochanteric right hip fracture. Alignment appears near anatomic. IMPRESSION: Six intraoperative fluoroscopic images from right femoral  ORIF for an intratrochanteric right hip fracture, as described. Electronically Signed   By: Kellie Simmering DO   On: 11/08/2020 12:51    Medications:  . allopurinol  100 mg Oral Daily  . Chlorhexidine Gluconate Cloth  6 each Topical Q0600  . docusate sodium  100 mg Oral BID  . donepezil  10 mg Oral QHS  . feeding supplement (NEPRO CARB STEADY)  237 mL Oral TID WC  . fluticasone  1 spray Each Nare Daily  . gabapentin  100 mg Oral QHS  . heparin injection  (subcutaneous)  5,000 Units Subcutaneous Q8H  . insulin aspart  0-6 Units Subcutaneous TID WC  . levothyroxine  50 mcg Oral QAC breakfast  . linagliptin  5 mg Oral Daily  . loratadine  10 mg Oral Daily  . memantine  10 mg Oral BID  . midodrine  10 mg Oral Q M,W,F-HD  . multivitamin  1 tablet Oral QHS  . pantoprazole  40 mg Oral Daily  . sevelamer carbonate  2,400 mg Oral 3 times per day on Sun Tue Thu Sat  . sevelamer carbonate  2,400 mg Oral 2 times per day on Mon Wed Fri  . simvastatin  20 mg Oral QHS    Dialysis Orders: 4hrs, BFR400, DFR500, EDW 74kg,2K/2Ca  Access:RU AVF Heparinnone Mircera42mcg q2wks - last 174mcg on 10/28/20  Assessment/Plan: 1. R femur Frx -s/p cephalomedullary nailing of R intertrochanteric hip Frx on 2/15 by Dr. Lucia Gaskins. Pain well controlled.  2. ESRD- On HD MWF. HD back on schedule yesterday. Orders written for HD tomorrow per regular schedule.  Last K 4.5. 3. Hypertension/volume- Blood pressure in goal. Continue home meds.On midodrine with HD.Does not appear volume overloaded. UF as tolerated.  4. Anemiaof CKD- Hgbstable at 8.2 post surgery. ESA due Friday, will order.   5. Secondary Hyperparathyroidism -Corrected Ca high. Not on VDRA, use low Ca bath.   Phos in goal, continue Renvela.  6. Nutrition- Currently on carb modified diet w/fluid restrictions.  If K becomes elevated will need to change to renal diet. Protein supplements. Renal vitamin.  7. DM - per primary 8. Hypothyroidism- on synthroid.  9. Gout 10. HLD 11. DOE - eval by cardiology. Echo with EF 50-55%, G1DD. Plan for OP follow up. 12. Tachycardia/A fib - occurred again overnight.  Metoprolol given. resolved this AM. Per primary   Jen Mow, PA-C [facility] 11/10/2020,7:50 AM  LOS: 3 days

## 2020-11-10 NOTE — Progress Notes (Signed)
Transition of Care [facility]) - CAGE-AID Screening   Patient Details  Name: [name] MRN: [id number] Date of Birth: 07/17/1941   Clinical Narrative:  Patient only oriented to self, unable to accurately complete. 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  CAGE-AID Screening: Substance Abuse Screening unable to be completed due to: : Patient unable to participate             Substance Abuse Education Offered:  (Pt only oriented to self)

## 2020-11-10 NOTE — Care Management Important Message (Signed)
Important Message  Patient Details  Name: [name] MRN: [id number] Date of Birth: July 21, 1941   Medicare Important Message Given:  Yes     Tai Syfert P [place] 11/10/2020, 2:18 PM

## 2020-11-10 NOTE — Progress Notes (Signed)
Physical Therapy Treatment Patient Details Name: [name] MRN: [id number] DOB: [birth year] Today's Date: 11/10/2020    History of Present Illness [name] is a 80 y.o. male with medical history significant of ESRD on HD, HTN, IDDM, diabetic neuropathy, hypothyroidism, gout, chronic ambulation dysfunction, presented with mechanical fall sustaining a R intertrochanteric  fx who underwent and R IM Nail on 2/15.    PT Comments    The pt was received in bed, and then participated in multiple rolls in bed as well as completing a series of exercises to maintain strength in LLE and mobility in RLE. The pt declined all further attempts at transitioning to sitting EOB or OOB mobility at this time, stating he needed "one more day" before attempting. The pt will continue to benefit form skilled PT to progress functional strength and activity tolerance to facilitate progress with pt goal of returning to prior level of mobility. The pt will continue to benefit from PT acutely as well as SNF rehab following d/c to further progress mobility until safe to return home.     Follow Up Recommendations  SNF;Supervision/Assistance - 24 hour     Equipment Recommendations  Rolling walker with 5" wheels    Recommendations for Other Services OT consult     Precautions / Restrictions Precautions Precautions: Fall Precaution Comments: watch HR Restrictions Weight Bearing Restrictions: Yes RLE Weight Bearing: Weight bearing as tolerated    Mobility  Bed Mobility Overal bed mobility: Needs Assistance Bed Mobility: Rolling;Supine to Sit Rolling: Mod assist;+2 for physical assistance   Supine to sit: Mod assist;HOB elevated     General bed mobility comments: pt able to complete roll with significant increased time, but relies on assist of 2 to complete. pt able to initiate reaches, but not generating movement. pt declined multiple attempts to sit EOB, able to pull forward to long sitting with use  of bilateral bed rails and modA to reposition in bed.    Transfers                 General transfer comment: pt declined at this time         Cognition Arousal/Alertness: Awake/alert Behavior During Therapy: WFL for tasks assessed/performed Overall Cognitive Status: Impaired/Different from baseline Area of Impairment: Attention;Memory;Safety/judgement;Awareness;Problem solving                   Current Attention Level: Focused Memory: Decreased short-term memory Following Commands: Follows one step commands inconsistently;Follows one step commands with increased time Safety/Judgement: Decreased awareness of safety;Decreased awareness of deficits   Problem Solving: Slow processing;Decreased initiation;Difficulty sequencing;Requires verbal cues;Requires tactile cues General Comments: pt able to express needs and following commands intermittently through session (appeared as if pt unable to hear some commands). Pt verbalized understanding of importance of mobility, but declining multiple attempts, internally distracted by pain      Exercises General Exercises - Lower Extremity Ankle Circles/Pumps: AROM;Right;10 reps;Supine Quad Sets: AROM;Both;10 reps;Supine Heel Slides: AROM;Left;10 reps;Supine    General Comments General comments (skin integrity, edema, etc.): Pt with HR in 140s upon arrival of PT (straining on bed pan). Pt educated on importance of not straining, pt declined use of BSC. HR returned to 80s when pt finished with bed pan      Pertinent Vitals/Pain Pain Assessment: Faces Faces Pain Scale: Hurts whole lot Pain Location: R hip with mobility Pain Descriptors / Indicators: Grimacing;Guarding;Moaning Pain Intervention(s): Limited activity within patient's tolerance;Monitored during session;Repositioned  PT Goals (current goals can now be found in the care plan section) Acute Rehab PT Goals Patient Stated Goal: reduce pain PT Goal  Formulation: With patient Time For Goal Achievement: 11/23/20 Potential to Achieve Goals: Good Progress towards PT goals: Not progressing toward goals - comment (pt declining OOB mobility for "one more day")    Frequency    Min 3X/week      PT Plan Current plan remains appropriate       AM-PAC PT "6 Clicks" Mobility   Outcome Measure  Help needed turning from your back to your side while in a flat bed without using bedrails?: A Lot Help needed moving from lying on your back to sitting on the side of a flat bed without using bedrails?: A Lot Help needed moving to and from a bed to a chair (including a wheelchair)?: A Lot Help needed standing up from a chair using your arms (e.g., wheelchair or bedside chair)?: Total Help needed to walk in hospital room?: Total Help needed climbing 3-5 steps with a railing? : Total 6 Click Score: 9    End of Session   Activity Tolerance: Patient limited by pain Patient left: in bed;with call bell/phone within reach;with bed alarm set Nurse Communication: Mobility status PT Visit Diagnosis: Unsteadiness on feet (R26.81);Other abnormalities of gait and mobility (R26.89);Muscle weakness (generalized) (M62.81);Difficulty in walking, not elsewhere classified (R26.2)     Time: 3329-5188 PT Time Calculation (min) (ACUTE ONLY): 27 min  Charges:  $Therapeutic Exercise: 8-22 mins $Therapeutic Activity: 8-22 mins                     Karma Ganja, PT, DPT   Acute Rehabilitation Department Pager #: [phone]   Otho Bellows 11/10/2020, 11:18 AM

## 2020-11-10 NOTE — Progress Notes (Signed)
PROGRESS NOTE    [name]  QHU:[id number] DOB: 11-16-1940 DOA: 11/07/2020 PCP: Elby Showers, MD    Brief Narrative:  80 year old gentleman with history of ESRD on hemodialysis Monday Wednesday Friday, dementia, hypertension, insulin-dependent diabetes, diabetic neuropathy, hypothyroidism presented to the ER with fall at home 2 days ago, unable to get up by himself.  Found to have right intertrochanteric femur fracture.  Assessment & Plan:   Principal Problem:   Closed nondisplaced intertrochanteric fracture of right femur (HCC) Active Problems:   Hyperlipidemia   Essential hypertension   Hypothyroidism   BPH (benign prostatic hyperplasia)   End stage renal disease (HCC)   OSA (obstructive sleep apnea)   Diabetes mellitus type 2 in nonobese (HCC)   Anemia   Pre-operative clearance  Closed nondisplaced intertrochanteric fracture of the right femur, traumatic: Status post ORIF 2/15 Weightbearing as tolerated DVT prophylaxis with heparin subcu, aspirin on discharge Pain control with Vicodin and hydromorphone along with laxatives. Refer to skilled nursing facility rehab as per PT OT recommendation.  ESRD on hemodialysis: Getting dialysis on his schedule.  Anemia of chronic disease: Hemoglobin is stable.  Replaced with ESA with hemodialysis.  Hyperlipidemia: Continue Zocor.  Essential hypertension: As needed hydralazine.  Sinus tachycardia: Asymptomatic episodic sinus tachycardia, no indication to treat.  Type 2 diabetes: Fairly stable.  On sliding scale.  A1c 4.9.   DVT prophylaxis: heparin injection 5,000 Units Start: 11/09/20 0600 SCDs Start: 11/08/20 1229 SCDs Start: 11/07/20 1442   Code Status: Partial Family Communication: Patient's wife on the phone. Discussed with patient's wife about discharge planning.  She was hoping him to go to acute inpatient rehab, however there is a waiting list and he will be best served at skilled level of care. Transfer to SNF  whenever bed is available. Disposition Plan: Status is: Inpatient  Remains inpatient appropriate because:Unsafe d/c plan   Dispo: The patient is from: Home              Anticipated d/c is to: SNF              Anticipated d/c date is: 1 day              Patient currently stable to discharge to skilled level of care.   Difficult to place patient No         Consultants:   Orthopedics  Nephrology  Procedures:   Right hip ORIF 2/15  Antimicrobials:   None, received perioperative antibiotics   Subjective: Patient seen and examined.  Pleasantly confused.  No overnight complaints. Overnight events noted, he had episode of sinus tachycardia with no evidence of arrhythmias.  Patient is poor historian and cannot explain any symptoms.  Currently sinus rhythm and rate controlled. Objective: Vitals:   11/10/20 0331 11/10/20 0400 11/10/20 0442 11/10/20 0851  BP: (!) 120/39  (!) 118/99 (!) 119/56  Pulse:  (!) 107 100 80  Resp:   18 18  Temp:   98.4 F (36.9 C) 98.7 F (37.1 C)  TempSrc:   Oral Oral  SpO2:   95% 97%  Weight:        Intake/Output Summary (Last 24 hours) at 11/10/2020 1157 Last data filed at 11/09/2020 1754 Gross per 24 hour  Intake 240 ml  Output 1448 ml  Net -1208 ml   Filed Weights   11/08/20 1955 11/09/20 1223 11/09/20 1528  Weight: 75 kg 76 kg 74.5 kg    Examination:  General exam: Appears comfortable. He has dementia  and forgetful.  Respiratory system: Bilateral clear. Cardiovascular system: S1 & S2 heard, RRR.  Gastrointestinal system: Soft and nontender. Central nervous system: Alert and oriented. No focal neurological deficits. Extremities: Symmetric 5 x 5 power. Skin: No rashes, lesions or ulcers Surgical incision clean and dry. Psychiatry: Judgement and insight appear normal. Mood & affect flat. Right upper extremity AV fistula present.    Data Reviewed: I have personally reviewed following labs and imaging studies  CBC: Recent  Labs  Lab 11/07/20 1200 11/08/20 0319 11/09/20 0447 11/10/20 0119  WBC 10.0 8.9 9.6 11.1*  NEUTROABS 6.9  --   --   --   HGB 10.3* 9.1* 8.1* 8.2*  HCT 30.8* 26.5* 24.7* 23.8*  MCV 105.5* 103.9* 101.2* 101.3*  PLT 163 157 168 941   Basic Metabolic Panel: Recent Labs  Lab 11/07/20 1200 11/08/20 0319 11/09/20 1235  NA 140 139 137  K 4.2 5.0 4.5  CL 101 102 98  CO2 26 23 26   GLUCOSE 134* 103* 98  BUN 49* 59* 39*  CREATININE 10.06* 10.97* 7.83*  CALCIUM 9.8 9.5 9.2  PHOS  --  4.5 5.4*   GFR: Estimated Creatinine Clearance: 8.1 mL/min (A) (by C-G formula based on SCr of 7.83 mg/dL (H)). Liver Function Tests: Recent Labs  Lab 11/07/20 1200 11/09/20 1235  AST 21  --   ALT 12  --   ALKPHOS 83  --   BILITOT 0.6  --   PROT 6.0*  --   ALBUMIN 2.9* 2.2*   No results for input(s): LIPASE, AMYLASE in the last 168 hours. No results for input(s): AMMONIA in the last 168 hours. Coagulation Profile: No results for input(s): INR, PROTIME in the last 168 hours. Cardiac Enzymes: No results for input(s): CKTOTAL, CKMB, CKMBINDEX, TROPONINI in the last 168 hours. BNP (last 3 results) No results for input(s): PROBNP in the last 8760 hours. HbA1C: Recent Labs    11/08/20 0319  HGBA1C 4.9   CBG: Recent Labs  Lab 11/09/20 1918 11/10/20 0008 11/10/20 0443 11/10/20 0625 11/10/20 1143  GLUCAP 169* 103* 109* 107* 189*   Lipid Profile: No results for input(s): CHOL, HDL, LDLCALC, TRIG, CHOLHDL, LDLDIRECT in the last 72 hours. Thyroid Function Tests: No results for input(s): TSH, T4TOTAL, FREET4, T3FREE, THYROIDAB in the last 72 hours. Anemia Panel: No results for input(s): VITAMINB12, FOLATE, FERRITIN, TIBC, IRON, RETICCTPCT in the last 72 hours. Sepsis Labs: No results for input(s): PROCALCITON, LATICACIDVEN in the last 168 hours.  Recent Results (from the past 240 hour(s))  Resp Panel by RT-PCR (Flu A&B, Covid) Nasopharyngeal Swab     Status: None   Collection Time:  11/07/20  1:38 PM   Specimen: Nasopharyngeal Swab; Nasopharyngeal(NP) swabs in vial transport medium  Result Value Ref Range Status   SARS Coronavirus 2 by RT PCR NEGATIVE NEGATIVE Final    Comment: (NOTE) SARS-CoV-2 target nucleic acids are NOT DETECTED.  The SARS-CoV-2 RNA is generally detectable in upper respiratory specimens during the acute phase of infection. The lowest concentration of SARS-CoV-2 viral copies this assay can detect is 138 copies/mL. A negative result does not preclude SARS-Cov-2 infection and should not be used as the sole basis for treatment or other patient management decisions. A negative result may occur with  improper specimen collection/handling, submission of specimen other than nasopharyngeal swab, presence of viral mutation(s) within the areas targeted by this assay, and inadequate number of viral copies(<138 copies/mL). A negative result must be combined with clinical observations, patient history,  and epidemiological information. The expected result is Negative.  Fact Sheet for Patients:  EntrepreneurPulse.com.au  Fact Sheet for Healthcare Providers:  IncredibleEmployment.be  This test is no t yet approved or cleared by the [place] FDA and  has been authorized for detection and/or diagnosis of SARS-CoV-2 by FDA under an Emergency Use Authorization (EUA). This EUA will remain  in effect (meaning this test can be used) for the duration of the COVID-19 declaration under Section 564(b)(1) of the Act, 21 U.S.C.section 360bbb-3(b)(1), unless the authorization is terminated  or revoked sooner.       Influenza A by PCR NEGATIVE NEGATIVE Final   Influenza B by PCR NEGATIVE NEGATIVE Final    Comment: (NOTE) The Xpert Xpress SARS-CoV-2/FLU/RSV plus assay is intended as an aid in the diagnosis of influenza from Nasopharyngeal swab specimens and should not be used as a sole basis for treatment. Nasal washings  and aspirates are unacceptable for Xpert Xpress SARS-CoV-2/FLU/RSV testing.  Fact Sheet for Patients: EntrepreneurPulse.com.au  Fact Sheet for Healthcare Providers: IncredibleEmployment.be  This test is not yet approved or cleared by the [place] FDA and has been authorized for detection and/or diagnosis of SARS-CoV-2 by FDA under an Emergency Use Authorization (EUA). This EUA will remain in effect (meaning this test can be used) for the duration of the COVID-19 declaration under Section 564(b)(1) of the Act, 21 U.S.C. section 360bbb-3(b)(1), unless the authorization is terminated or revoked.  Performed at [facility], [place] [address] [place], [place] [address]   Surgical pcr screen     Status: None   Collection Time: 11/08/20 12:13 AM   Specimen: Nasal Mucosa; Nasal Swab  Result Value Ref Range Status   MRSA, PCR NEGATIVE NEGATIVE Final   Staphylococcus aureus NEGATIVE NEGATIVE Final    Comment: (NOTE) The Xpert SA Assay (FDA approved for NASAL specimens in patients 32 years of age and older), is one component of a comprehensive surveillance program. It is not intended to diagnose infection nor to guide or monitor treatment. Performed at [facility], [place] [address], [place],  [address]          Radiology Studies: No results found.      Scheduled Meds: . allopurinol  100 mg Oral Daily  . Chlorhexidine Gluconate Cloth  6 each Topical Q0600  . Chlorhexidine Gluconate Cloth  6 each Topical Q0600  . [START ON 11/11/2020] darbepoetin (ARANESP) injection - DIALYSIS  100 mcg Intravenous Q Fri-HD  . docusate sodium  100 mg Oral BID  . donepezil  10 mg Oral QHS  . feeding supplement (NEPRO CARB STEADY)  237 mL Oral TID WC  . fluticasone  1 spray Each Nare Daily  . gabapentin  100 mg Oral QHS  . heparin injection (subcutaneous)  5,000 Units Subcutaneous Q8H  . insulin aspart  0-6 Units Subcutaneous TID WC  .  levothyroxine  50 mcg Oral QAC breakfast  . linagliptin  5 mg Oral Daily  . loratadine  10 mg Oral Daily  . memantine  10 mg Oral BID  . midodrine  10 mg Oral Q M,W,F-HD  . multivitamin  1 tablet Oral QHS  . pantoprazole  40 mg Oral Daily  . sevelamer carbonate  2,400 mg Oral 3 times per day on Sun Tue Thu Sat  . sevelamer carbonate  2,400 mg Oral 2 times per day on Mon Wed Fri  . simvastatin  20 mg Oral QHS   Continuous Infusions:   LOS: 3  days    Time spent: 32 minutes    Barb Merino, MD [facility] Pager [phone]

## 2020-11-10 NOTE — Progress Notes (Signed)
Inpatient Rehab Admissions Coordinator:   Per CM request, pt was screened for appropriateness for CIR.  At this time, note pt max +2 to get to EOB but unable to safely attempt further mobility.  Does not appear to be a candidate at this time due to poor tolerance.  Also note we do not have a bed to offer the patient at this time and pt is medically stable to discharge.  Would recommend f/u per PT recommendations for SNF.    Shann Medal, PT, DPT Admissions Coordinator [phone] 11/10/20  10:58 AM

## 2020-11-10 NOTE — Progress Notes (Signed)
One time dose of metoprolol ordered because HR sustained over 120 for 10 min. Administered at this time by this RN.

## 2020-11-10 NOTE — TOC Initial Note (Signed)
Transition of Care [facility]) - Initial/Assessment Note    Patient Details  Name: [name] MRN: [id number] Date of Birth: Sep 27, 1940  Transition of Care [facility]) CM/SW Contact:    Sharin Mons, RN Phone Number: 11/10/2020, 10:33 AM  Clinical Narrative:   Admitted s/p a suffered R intertrochanteric  fx .         - s/p R IM Nail on 2/15           From home with wife ( primary care giver). Wife states the [place] has provided pt with PCS/ nurse aide that comes to the home M-F and assist with bathing/ADL's, 8-10 am. Pt receives outpatient HD sessions MWF, [facility]. Pt already has DME: rolling walker, cane, scooter. NCM spoke with wife after receiving the ok from pt regarding d/c planning. NCM shared PT evaluation/ recommendations for SNF placement. Wife stated she would like for pt to transition to CIR, states pt has had past experience with CIR. NCM explained will inform MD of request, however, currently  CIR with no bed availability. Wife gave NCM the ok to work pt up for SNF placement as a back up. Wife states she  is currently unable to care for patient at their home given patient's current physical needs and fall risk. Wife reports no  preference for SNF. NCM discussed insurance authorization process and will  provided Medicare SNF ratings list .    No further questions reported at this time. RNCM to continue to follow and assist with discharge planning needs.   Expected Discharge Plan: [place] (vs SNF) Barriers to Discharge: Continued Medical Work up   Patient Goals and CMS Choice Patient states their goals for this hospitalization and ongoing recovery are:: to get better CMS Medicare.gov Compare Post Acute Care list provided to:: Patient Represenative (must comment)    Expected Discharge Plan and Services Expected Discharge Plan: [place] (vs SNF)   Discharge Planning Services: CM Consult                                          Prior  Living Arrangements/Services   Lives with:: Spouse Patient language and need for interpreter reviewed:: Yes        Need for Family Participation in Patient Care: Yes (Comment) Care giver support system in place?: Yes (comment)   Criminal Activity/Legal Involvement Pertinent to Current Situation/Hospitalization: No - Comment as needed  Activities of Daily Living   ADL Screening (condition at time of admission) Patient's cognitive ability adequate to safely complete daily activities?: No Is the patient deaf or have difficulty hearing?: No Does the patient have difficulty seeing, even when wearing glasses/contacts?: Yes Does the patient have difficulty concentrating, remembering, or making decisions?: Yes Patient able to express need for assistance with ADLs?: Yes Does the patient have difficulty dressing or bathing?: Yes Independently performs ADLs?: No Does the patient have difficulty walking or climbing stairs?: Yes Weakness of Legs: Both Weakness of Arms/Hands: None  Permission Sought/Granted   Permission granted to share information with : Yes, Verbal Permission Granted  Share Information with NAME: Tesla Bochicchio (Spouse) [phone]           Emotional Assessment Appearance:: Appears stated age Attitude/Demeanor/Rapport: Gracious Affect (typically observed): Accepting Orientation: : Oriented to Self,Oriented to Place,Oriented to Situation Alcohol / Substance Use: Not Applicable Psych Involvement: No (comment)  Admission diagnosis:  Hip  fracture ([place]) [S72.009A] Chest pain [R07.9] Closed nondisplaced intertrochanteric fracture of right femur, initial encounter ([place]) [S72.144A] Fall, initial encounter [W19.XXXA] Patient Active Problem List   Diagnosis Date Noted  . Pre-operative clearance   . Closed nondisplaced intertrochanteric fracture of right femur ([place]) 11/07/2020  . Hip fracture ([place]) 11/07/2020  . Fall   . Rectal bleeding 04/22/2020  . AVM (arteriovenous  malformation) of small bowel, acquired   . Hiatal hernia   . Schatzki's ring of distal esophagus   . Melena 11/19/2018  . Leukocytosis   . Anemia   . TBI (traumatic brain injury) ([place]) 07/29/2018  . 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 Loose stools   . Dementia arising in the senium and presenium ([place])   . 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 Multiple fractures of ribs, left side, initial encounter for closed fracture 07/21/2018  . Subdural hematoma ([place]) 07/21/2018  . Angiodysplasia of stomach   . Occult GI bleeding   . GIB (gastrointestinal bleeding) 06/01/2018  . Low back pain 01/17/2018  . Anorexia   . Dyspnea   . Macrocytic anemia 06/04/2016  . Thrombocytopenia ([place]) 06/04/2016  . Exertional dyspnea 06/04/2016  . Arm paresthesia, left 06/04/2016  . Dyspnea on exertion 06/04/2016  . Tenderness of right calf 06/04/2016  . Mild cognitive impairment with memory loss 01/12/2016  . ESRD on dialysis ([place]) 05/17/2015  . Hypoglycemia   . Weakness 02/18/2015  . OSA (obstructive sleep apnea) 02/02/2015  . Hyperkalemia 10/05/2014  . End stage renal disease ([place]) 11/18/2013  . BPH (benign prostatic hyperplasia) 11/22/2012  . Peripheral vascular disease ([place]) 12/24/2011  . Hyperparathyroidism ([place]) 12/24/2011  . Hypothyroidism 12/24/2011  . Allergic rhinitis 12/24/2011  . Erectile dysfunction 12/24/2011  . Insulin dependent diabetes mellitus 09/03/2008  . Hyperlipidemia 09/03/2008  . Essential hypertension 08/30/2008  . PANCREATITIS, HX OF 08/30/2008  . RENAL FAILURE, ACUTE, HX OF 08/30/2008  . DIVERTICULOSIS, COLON 06/08/2003   PCP:  Elby Showers, MD Pharmacy:   [facility] [place], [place] AT [address] [address] Phone: [phone] Fax: [place], [place] - [address] [place] [address] Phone: [phone] Fax: [phone]     Social Determinants of Health (SDOH) Interventions    Readmission Risk Interventions No flowsheet data found.

## 2020-11-11 DIAGNOSIS — S72144A Nondisplaced intertrochanteric fracture of right femur, initial encounter for closed fracture: Secondary | ICD-10-CM | POA: Diagnosis not present

## 2020-11-11 LAB — RENAL FUNCTION PANEL
Albumin: 2.1 g/dL — ABNORMAL LOW (ref 3.5–5.0)
Anion gap: 14 (ref 5–15)
BUN: 44 mg/dL — ABNORMAL HIGH (ref 8–23)
CO2: 28 mmol/L (ref 22–32)
Calcium: 9.4 mg/dL (ref 8.9–10.3)
Chloride: 97 mmol/L — ABNORMAL LOW (ref 98–111)
Creatinine, Ser: 8.21 mg/dL — ABNORMAL HIGH (ref 0.61–1.24)
GFR, Estimated: 6 mL/min — ABNORMAL LOW (ref >60)
Glucose, Bld: 147 mg/dL — ABNORMAL HIGH (ref 70–99)
Phosphorus: 3.5 mg/dL (ref 2.5–4.6)
Potassium: 3.8 mmol/L (ref 3.5–5.1)
Sodium: 139 mmol/L (ref 135–145)

## 2020-11-11 LAB — CBC
HCT: 23.9 % — ABNORMAL LOW (ref 39.0–52.0)
Hemoglobin: 7.8 g/dL — ABNORMAL LOW (ref 13.0–17.0)
MCH: 33.2 pg (ref 26.0–34.0)
MCHC: 32.6 g/dL (ref 30.0–36.0)
MCV: 101.7 fL — ABNORMAL HIGH (ref 80.0–100.0)
Platelets: 199 10*3/uL (ref 150–400)
RBC: 2.35 MIL/uL — ABNORMAL LOW (ref 4.22–5.81)
RDW: 16 % — ABNORMAL HIGH (ref 11.5–15.5)
WBC: 10.1 10*3/uL (ref 4.0–10.5)
nRBC: 0 % (ref 0.0–0.2)

## 2020-11-11 LAB — GLUCOSE, CAPILLARY
Glucose-Capillary: 135 mg/dL — ABNORMAL HIGH (ref 70–99)
Glucose-Capillary: 158 mg/dL — ABNORMAL HIGH (ref 70–99)
Glucose-Capillary: 164 mg/dL — ABNORMAL HIGH (ref 70–99)
Glucose-Capillary: 178 mg/dL — ABNORMAL HIGH (ref 70–99)

## 2020-11-11 MED ORDER — LIDOCAINE-PRILOCAINE 2.5-2.5 % EX CREA: 1.0000 "application " | TOPICAL_CREAM | CUTANEOUS | Status: DC | PRN

## 2020-11-11 MED ORDER — PENTAFLUOROPROP-TETRAFLUOROETH EX AERO: 1.0000 "application " | INHALATION_SPRAY | CUTANEOUS | Status: DC | PRN

## 2020-11-11 MED ORDER — HEPARIN SODIUM (PORCINE) 1000 UNIT/ML DIALYSIS: 1000.0000 [IU] | INTRAMUSCULAR | Status: DC | PRN

## 2020-11-11 MED ORDER — ALTEPLASE 2 MG IJ SOLR: 2.0000 mg | Freq: Once | INTRAMUSCULAR | Status: DC | PRN

## 2020-11-11 MED ORDER — DARBEPOETIN ALFA 100 MCG/0.5ML IJ SOSY
PREFILLED_SYRINGE | INTRAMUSCULAR | Status: AC
  Filled 2020-11-11: qty 0.5

## 2020-11-11 MED ORDER — MIDODRINE HCL 5 MG PO TABS
ORAL_TABLET | ORAL | Status: AC
  Filled 2020-11-11: qty 2

## 2020-11-11 MED ORDER — SODIUM CHLORIDE 0.9 % IV SOLN: 100.0000 mL | INTRAVENOUS | Status: DC | PRN

## 2020-11-11 MED ORDER — LIDOCAINE HCL (PF) 1 % IJ SOLN: 5.0000 mL | INTRAMUSCULAR | Status: DC | PRN

## 2020-11-11 NOTE — Plan of Care (Signed)

## 2020-11-11 NOTE — TOC Progression Note (Signed)
Transition of Care [facility]) - Progression Note    Patient Details  Name: [name] MRN: [id number] Date of Birth: [birth year]  Transition of Care [facility]) CM/SW [place], [place] Phone Number: 11/11/2020, 5:05 PM  Clinical Narrative:    CSW spoke with pt's wife to give bed offers. She noted that out of the options, [facility] would be the best fit. She advised that they are still hoping for CIR placement. There is no recommendation for that yet. Spouse asked that she receive update with any new information. SW will continue to follow.   Expected Discharge Plan: [place] (vs SNF) Barriers to Discharge: Continued Medical Work up  Expected Discharge Plan and Services Expected Discharge Plan: [place] (vs SNF)   Discharge Planning Services: CM Consult                                           Social Determinants of Health (SDOH) Interventions    Readmission Risk Interventions No flowsheet data found.

## 2020-11-11 NOTE — Evaluation (Signed)
Occupational Therapy Evaluation Patient Details Name: [name] MRN: [id number] DOB: Dec 22, 1940 Today's Date: 11/11/2020    History of Present Illness [name] is a 80 y.o. male with medical history significant of ESRD on HD, HTN, IDDM, diabetic neuropathy, hypothyroidism, gout, chronic ambulation dysfunction, presented with mechanical fall sustaining a R intertrochanteric  fx who underwent and R IM Nail on 2/15.   Clinical Impression   Pt presents with decline in function and safety with ADLs and ADL mobility with impaired strength, balance and endurance; pt with some confusion. Unaware that he is in hospital and thinks that he is in a hotel room. Uncertain of accuracy of PLOF info provided by pt. Pt required max encouragement to participated, but eventually agreeable to attempt sitting EOB. Max A for bed mobility and for sup - sit to EOB. Pt sat EOB x 2 minutes with much encouragement (mod A for balance/support) and washed face and hands before insisting to lay back down and pushing back to return to supine. Pt would benefit from acute OT services to address impairments to maximize level of function and safety    Follow Up Recommendations  SNF;Supervision/Assistance - 24 hour    Equipment Recommendations  Other (comment);3 in 1 bedside commode (RW, TBD at next venue of care)    Recommendations for Other Services       Precautions / Restrictions Precautions Precautions: Fall Precaution Comments: watch HR Restrictions Weight Bearing Restrictions: Yes RLE Weight Bearing: Weight bearing as tolerated      Mobility Bed Mobility Overal bed mobility: Needs Assistance Bed Mobility: Rolling;Supine to Sit Rolling: Max assist   Supine to sit: Max assist;HOB elevated     General bed mobility comments: max encouragement to sit EOB, increased time and effort to complete, cues to use rails. max A with LEs to EOB and to elevate trunk    Transfers                 General  transfer comment: pt declined at this time    Balance Overall balance assessment: Needs assistance Sitting-balance support: Feet supported;Bilateral upper extremity supported Sitting balance-Leahy Scale: Poor Sitting balance - Comments: required min to max A posteriorly due to leaning backwards Postural control: Posterior lean;Left lateral lean                                 ADL either performed or assessed with clinical judgement   ADL Overall ADL's : Needs assistance/impaired Eating/Feeding: Set up;Supervision/ safety;Sitting;Bed level   Grooming: Wash/dry hands;Wash/dry face;Minimal assistance Grooming Details (indicate cue type and reason): mod A for balance and support seated EOB Upper Body Bathing: Moderate assistance;Bed level   Lower Body Bathing: Maximal assistance;Bed level   Upper Body Dressing : Moderate assistance;Bed level   Lower Body Dressing: Total assistance     Toilet Transfer Details (indicate cue type and reason): pt declined Toileting- Clothing Manipulation and Hygiene: Total assistance;Bed level         General ADL Comments: pt sat EOB x 2 minutes with much encouragement (mod A for balance/support) and washed face and hands before insisting to lay back down and pushing back to return to supine     Vision Baseline Vision/History: Wears glasses Wears Glasses: Reading only Patient Visual Report: No change from baseline       Perception     Praxis      Pertinent Vitals/Pain Pain Assessment: Faces Faces  Pain Scale: Hurts even more Pain Location: R hip with mobility Pain Descriptors / Indicators: Grimacing;Guarding;Moaning Pain Intervention(s): Limited activity within patient's tolerance;Repositioned;Monitored during session     Hand Dominance Right   Extremity/Trunk Assessment Upper Extremity Assessment Upper Extremity Assessment: Generalized weakness   Lower Extremity Assessment Lower Extremity Assessment: Defer to PT  evaluation   Cervical / Trunk Assessment Cervical / Trunk Assessment: Normal   Communication Communication Communication: No difficulties   Cognition Arousal/Alertness: Awake/alert Behavior During Therapy: WFL for tasks assessed/performed Overall Cognitive Status: Impaired/Different from baseline Area of Impairment: Attention;Memory;Safety/judgement;Awareness;Problem solving                 Orientation Level: Disoriented to;Place;Time;Situation   Memory: Decreased short-term memory Following Commands: Follows one step commands inconsistently;Follows one step commands with increased time Safety/Judgement: Decreased awareness of safety;Decreased awareness of deficits   Problem Solving: Slow processing;Decreased initiation;Difficulty sequencing;Requires verbal cues;Requires tactile cues General Comments: Pt verbalized understanding of importance of mobility, but declining multiple attempts, internally distracted by pain. Agreeable to EOB   General Comments       Exercises     Shoulder Instructions      Home Living Family/patient expects to be discharged to:: Skilled nursing facility Living Arrangements: Spouse/significant other                               Additional Comments: PTA pt lived with spouse. he reports 1 story home with tub shower. pt states he didn't [place] an equipment      Prior Functioning/Environment Level of Independence: Independent        Comments: per patient he was Ind with ADLs/selfcare and ambulated on his own without AD and took a bus to HD, unsure of accuracy of report of PLOF        OT Problem List: Decreased strength;Impaired balance (sitting and/or standing);Decreased cognition;Pain;Decreased safety awareness;Decreased activity tolerance;Decreased knowledge of use of DME or AE      OT Treatment/Interventions: Self-care/ADL training;Therapeutic activities;DME and/or AE instruction;Balance training;Therapeutic  exercise;Patient/family education    OT Goals(Current goals can be found in the care plan section) Acute Rehab OT Goals Patient Stated Goal: less pain OT Goal Formulation: With patient Time For Goal Achievement: 11/25/20 Potential to Achieve Goals: Good ADL Goals Pt Will Perform Grooming: with min guard assist;with supervision;with set-up;sitting Pt Will Perform Upper Body Bathing: with min guard assist;with supervision;with set-up;sitting Pt Will Perform Upper Body Dressing: with min guard assist;with supervision;with set-up;sitting Pt Will Transfer to Toilet: with max assist;with mod assist;stand pivot transfer;bedside commode  OT Frequency: Min 2X/week   Barriers to D/C:            Co-evaluation              AM-PAC OT "6 Clicks" Daily Activity     Outcome Measure Help from another person eating meals?: None Help from another person taking care of personal grooming?: A Lot Help from another person toileting, which includes using toliet, bedpan, or urinal?: Total Help from another person bathing (including washing, rinsing, drying)?: A Lot Help from another person to put on and taking off regular upper body clothing?: A Lot Help from another person to put on and taking off regular lower body clothing?: Total 6 Click Score: 12   End of Session    Activity Tolerance: Patient limited by fatigue;Patient limited by pain Patient left: in bed;with call bell/phone within reach;with bed alarm set  OT Visit Diagnosis:  Other abnormalities of gait and mobility (R26.89);History of falling (Z91.81);Muscle weakness (generalized) (M62.81);Pain;Other symptoms and signs involving cognitive function Pain - Right/Left: Right Pain - part of body: Leg                Time: 5702-2026 OT Time Calculation (min): 21 min Charges:  OT General Charges $OT Visit: 1 Visit OT Evaluation $OT Eval Moderate Complexity: 1 Mod OT Treatments $Self Care/Home Management : 8-22 mins    Britt Bottom 11/11/2020, 3:32 PM

## 2020-11-11 NOTE — Progress Notes (Signed)
OT Cancellation Note  Patient Details Name: [name] MRN: [id number] DOB: October 31, 1940   Cancelled Treatment:    Reason Eval/Treat Not Completed: Patient at procedure or test/ unavailable. Pt off unit at HD, OT will follow up next available time  Britt Bottom 11/11/2020, 11:07 AM

## 2020-11-11 NOTE — Progress Notes (Signed)
PROGRESS NOTE    [name]  VQM:[id number] DOB: 09-06-1941 DOA: 11/07/2020 PCP: Elby Showers, MD    Brief Narrative:  80 year old gentleman with history of ESRD on hemodialysis Monday Wednesday Friday, dementia, hypertension, insulin-dependent diabetes, diabetic neuropathy, hypothyroidism presented to the ER with fall at home 2 days ago, unable to get up by himself.  Found to have right intertrochanteric femur fracture.  Assessment & Plan:   Principal Problem:   Closed nondisplaced intertrochanteric fracture of right femur (HCC) Active Problems:   Hyperlipidemia   Essential hypertension   Hypothyroidism   BPH (benign prostatic hyperplasia)   End stage renal disease (HCC)   OSA (obstructive sleep apnea)   Diabetes mellitus type 2 in nonobese (HCC)   Anemia   Pre-operative clearance  Closed nondisplaced intertrochanteric fracture of the right femur, traumatic: Status post ORIF 2/15 Weightbearing as tolerated DVT prophylaxis with heparin subcu, aspirin on discharge Pain control with Vicodin along with laxatives. Refer to skilled nursing facility rehab as per PT OT recommendation.  ESRD on hemodialysis: Getting dialysis on his schedule.  Anemia of chronic disease: Hemoglobin is stable.  Replaced with ESA with hemodialysis.  Hyperlipidemia: Continue Zocor.  Essential hypertension: As needed hydralazine.  Sinus tachycardia: Asymptomatic episodic sinus tachycardia, no indication to treat.  Type 2 diabetes: Fairly stable.  On sliding scale.  A1c 4.9.   DVT prophylaxis: heparin injection 5,000 Units Start: 11/09/20 0600 SCDs Start: 11/08/20 1229 SCDs Start: 11/07/20 1442   Code Status: Partial Family Communication: Patient's wife on the phone 2/17. Disposition Plan: Status is: Inpatient  Remains inpatient appropriate because:Unsafe d/c plan   Dispo: The patient is from: Home              Anticipated d/c is to: SNF              Anticipated d/c date is: 1 day               Patient currently stable to discharge to skilled level of care.   Difficult to place patient No   Patient is medically stable to transfer to a skilled level of care.      Consultants:   Orthopedics  Nephrology  Procedures:   Right hip ORIF 2/15  Antimicrobials:   None, received perioperative antibiotics   Subjective: Patient seen and examined.  No overnight events.  Denies any complaints.  Came back from dialysis.  He was just worried about not able to move his right leg due to pain.  Objective: Vitals:   11/11/20 1020 11/11/20 1045 11/11/20 1047 11/11/20 1139  BP: (!) 87/57 (!) 106/40 (!) 107/42 (!) 116/47  Pulse:   75 76  Resp: 19 15 15 16   Temp:   (!) 97.5 F (36.4 C)   TempSrc:   Oral   SpO2:   95% 96%  Weight:   73.6 kg     Intake/Output Summary (Last 24 hours) at 11/11/2020 1406 Last data filed at 11/11/2020 1047 Gross per 24 hour  Intake -  Output 1440 ml  Net -1440 ml   Filed Weights   11/09/20 1528 11/11/20 0738 11/11/20 1047  Weight: 74.5 kg 75.1 kg 73.6 kg    Examination:  General exam: Appears comfortable. He has dementia and forgetful.  Respiratory system: Bilateral clear. Cardiovascular system: S1 & S2 heard, RRR.  Gastrointestinal system: Soft and nontender. Central nervous system: Alert and oriented. No focal neurological deficits. Extremities: Symmetric 5 x 5 power. Psychiatry: Judgement and insight appear normal.  Mood & affect flat. Right upper extremity AV fistula present. Lateral thigh incision clean and dry.    Data Reviewed: I have personally reviewed following labs and imaging studies  CBC: Recent Labs  Lab 11/07/20 1200 11/08/20 0319 11/09/20 0447 11/10/20 0119 11/11/20 0125  WBC 10.0 8.9 9.6 11.1* 10.1  NEUTROABS 6.9  --   --   --   --   HGB 10.3* 9.1* 8.1* 8.2* 7.8*  HCT 30.8* 26.5* 24.7* 23.8* 23.9*  MCV 105.5* 103.9* 101.2* 101.3* 101.7*  PLT 163 157 168 191 696   Basic Metabolic Panel: Recent  Labs  Lab 11/07/20 1200 11/08/20 0319 11/09/20 1235 11/11/20 0826  NA 140 139 137 139  K 4.2 5.0 4.5 3.8  CL 101 102 98 97*  CO2 26 23 26 28   GLUCOSE 134* 103* 98 147*  BUN 49* 59* 39* 44*  CREATININE 10.06* 10.97* 7.83* 8.21*  CALCIUM 9.8 9.5 9.2 9.4  PHOS  --  4.5 5.4* 3.5   GFR: Estimated Creatinine Clearance: 7.6 mL/min (A) (by C-G formula based on SCr of 8.21 mg/dL (H)). Liver Function Tests: Recent Labs  Lab 11/07/20 1200 11/09/20 1235 11/11/20 0826  AST 21  --   --   ALT 12  --   --   ALKPHOS 83  --   --   BILITOT 0.6  --   --   PROT 6.0*  --   --   ALBUMIN 2.9* 2.2* 2.1*   No results for input(s): LIPASE, AMYLASE in the last 168 hours. No results for input(s): AMMONIA in the last 168 hours. Coagulation Profile: No results for input(s): INR, PROTIME in the last 168 hours. Cardiac Enzymes: No results for input(s): CKTOTAL, CKMB, CKMBINDEX, TROPONINI in the last 168 hours. BNP (last 3 results) No results for input(s): PROBNP in the last 8760 hours. HbA1C: No results for input(s): HGBA1C in the last 72 hours. CBG: Recent Labs  Lab 11/10/20 1143 11/10/20 1620 11/10/20 2032 11/11/20 0651 11/11/20 1217  GLUCAP 189* 143* 153* 135* 158*   Lipid Profile: No results for input(s): CHOL, HDL, LDLCALC, TRIG, CHOLHDL, LDLDIRECT in the last 72 hours. Thyroid Function Tests: No results for input(s): TSH, T4TOTAL, FREET4, T3FREE, THYROIDAB in the last 72 hours. Anemia Panel: No results for input(s): VITAMINB12, FOLATE, FERRITIN, TIBC, IRON, RETICCTPCT in the last 72 hours. Sepsis Labs: No results for input(s): PROCALCITON, LATICACIDVEN in the last 168 hours.  Recent Results (from the past 240 hour(s))  Resp Panel by RT-PCR (Flu A&B, Covid) Nasopharyngeal Swab     Status: None   Collection Time: 11/07/20  1:38 PM   Specimen: Nasopharyngeal Swab; Nasopharyngeal(NP) swabs in vial transport medium  Result Value Ref Range Status   SARS Coronavirus 2 by RT PCR  NEGATIVE NEGATIVE Final    Comment: (NOTE) SARS-CoV-2 target nucleic acids are NOT DETECTED.  The SARS-CoV-2 RNA is generally detectable in upper respiratory specimens during the acute phase of infection. The lowest concentration of SARS-CoV-2 viral copies this assay can detect is 138 copies/mL. A negative result does not preclude SARS-Cov-2 infection and should not be used as the sole basis for treatment or other patient management decisions. A negative result may occur with  improper specimen collection/handling, submission of specimen other than nasopharyngeal swab, presence of viral mutation(s) within the areas targeted by this assay, and inadequate number of viral copies(<138 copies/mL). A negative result must be combined with clinical observations, patient history, and epidemiological information. The expected result is Negative.  Fact  Sheet for Patients:  EntrepreneurPulse.com.au  Fact Sheet for Healthcare Providers:  IncredibleEmployment.be  This test is no t yet approved or cleared by the [place] FDA and  has been authorized for detection and/or diagnosis of SARS-CoV-2 by FDA under an Emergency Use Authorization (EUA). This EUA will remain  in effect (meaning this test can be used) for the duration of the COVID-19 declaration under Section 564(b)(1) of the Act, 21 U.S.C.section 360bbb-3(b)(1), unless the authorization is terminated  or revoked sooner.       Influenza A by PCR NEGATIVE NEGATIVE Final   Influenza B by PCR NEGATIVE NEGATIVE Final    Comment: (NOTE) The Xpert Xpress SARS-CoV-2/FLU/RSV plus assay is intended as an aid in the diagnosis of influenza from Nasopharyngeal swab specimens and should not be used as a sole basis for treatment. Nasal washings and aspirates are unacceptable for Xpert Xpress SARS-CoV-2/FLU/RSV testing.  Fact Sheet for Patients: EntrepreneurPulse.com.au  Fact Sheet for  Healthcare Providers: IncredibleEmployment.be  This test is not yet approved or cleared by the [place] FDA and has been authorized for detection and/or diagnosis of SARS-CoV-2 by FDA under an Emergency Use Authorization (EUA). This EUA will remain in effect (meaning this test can be used) for the duration of the COVID-19 declaration under Section 564(b)(1) of the Act, 21 U.S.C. section 360bbb-3(b)(1), unless the authorization is terminated or revoked.  Performed at [facility], [place] [address] [place], [place] [address]   Surgical pcr screen     Status: None   Collection Time: 11/08/20 12:13 AM   Specimen: Nasal Mucosa; Nasal Swab  Result Value Ref Range Status   MRSA, PCR NEGATIVE NEGATIVE Final   Staphylococcus aureus NEGATIVE NEGATIVE Final    Comment: (NOTE) The Xpert SA Assay (FDA approved for NASAL specimens in patients 70 years of age and older), is one component of a comprehensive surveillance program. It is not intended to diagnose infection nor to guide or monitor treatment. Performed at [facility], [employer] [address] [place], [place] [address]          Radiology Studies: No results found.      Scheduled Meds: . allopurinol  100 mg Oral Daily  . Chlorhexidine Gluconate Cloth  6 each Topical Q0600  . Chlorhexidine Gluconate Cloth  6 each Topical Q0600  . darbepoetin (ARANESP) injection - DIALYSIS  100 mcg Intravenous Q Fri-HD  . docusate sodium  100 mg Oral BID  . donepezil  10 mg Oral QHS  . feeding supplement (NEPRO CARB STEADY)  237 mL Oral TID WC  . fluticasone  1 spray Each Nare Daily  . gabapentin  100 mg Oral QHS  . heparin injection (subcutaneous)  5,000 Units Subcutaneous Q8H  . insulin aspart  0-6 Units Subcutaneous TID WC  . levothyroxine  50 mcg Oral QAC breakfast  . linagliptin  5 mg Oral Daily  . loratadine  10 mg Oral Daily  . memantine  10 mg Oral BID  . midodrine  10 mg Oral Q M,W,F-HD  .  multivitamin  1 tablet Oral QHS  . pantoprazole  40 mg Oral Daily  . sevelamer carbonate  2,400 mg Oral 3 times per day on Sun Tue Thu Sat  . sevelamer carbonate  2,400 mg Oral 2 times per day on Mon Wed Fri  . simvastatin  20 mg Oral QHS   Continuous Infusions:   LOS: 4 days    Time spent: 32 minutes    Barb Merino,  MD [facility] Pager [phone]

## 2020-11-11 NOTE — Procedures (Signed)
Patient seen on Hemodialysis. BP (!) 124/48   Pulse 80   Temp 99 F (37.2 C) (Oral)   Resp 17   Wt 75.1 kg   SpO2 97%   BMI 23.09 kg/m   QB 400, UF goal 2L Tolerating treatment with complaints of (surgical site) right hip pain at this time.   Elmarie Shiley MD [facility]. Office # [phone] Pager # [phone] 9:58 AM

## 2020-11-12 DIAGNOSIS — S72144A Nondisplaced intertrochanteric fracture of right femur, initial encounter for closed fracture: Secondary | ICD-10-CM | POA: Diagnosis not present

## 2020-11-12 LAB — GLUCOSE, CAPILLARY
Glucose-Capillary: 133 mg/dL — ABNORMAL HIGH (ref 70–99)
Glucose-Capillary: 143 mg/dL — ABNORMAL HIGH (ref 70–99)
Glucose-Capillary: 215 mg/dL — ABNORMAL HIGH (ref 70–99)
Glucose-Capillary: 133 mg/dL — ABNORMAL HIGH (ref 70–99)

## 2020-11-12 NOTE — Progress Notes (Incomplete)
[facility] Progress Note   Subjective:    Dialyzed yesterday net UF 1.1 L   Objective Vitals:   11/11/20 1139 11/11/20 1938 11/12/20 0419 11/12/20 0726  BP: (!) 116/47 (!) 119/44 (!) 104/48 (!) 113/41  Pulse: 76 75 76 72  Resp: 16 16 16 17   Temp:  98.5 F (36.9 C) 98.7 F (37.1 C) 98.5 F (36.9 C)  TempSrc:  Oral  Oral  SpO2: 96% 100% 93% 97%  Weight:       Physical Exam General:WDWN, pleasantly confused, alert male in NAD Heart:RRR, no mrg Lungs:CTAB, nml WOB Abdomen:soft, NTND Extremities:no LE edema Dialysis Access: RU AVF +b   Filed Weights   11/09/20 1528 11/11/20 0738 11/11/20 1047  Weight: 74.5 kg 75.1 kg 73.6 kg    Intake/Output Summary (Last 24 hours) at 11/12/2020 0934 Last data filed at 11/11/2020 1047 Gross per 24 hour  Intake -  Output 1140 ml  Net -1140 ml    Additional Objective Labs: Basic Metabolic Panel: Recent Labs  Lab 11/08/20 0319 11/09/20 1235 11/11/20 0826  NA 139 137 139  K 5.0 4.5 3.8  CL 102 98 97*  CO2 23 26 28   GLUCOSE 103* 98 147*  BUN 59* 39* 44*  CREATININE 10.97* 7.83* 8.21*  CALCIUM 9.5 9.2 9.4  PHOS 4.5 5.4* 3.5   Liver Function Tests: Recent Labs  Lab 11/07/20 1200 11/09/20 1235 11/11/20 0826  AST 21  --   --   ALT 12  --   --   ALKPHOS 83  --   --   BILITOT 0.6  --   --   PROT 6.0*  --   --   ALBUMIN 2.9* 2.2* 2.1*   CBC: Recent Labs  Lab 11/07/20 1200 11/08/20 0319 11/09/20 0447 11/10/20 0119 11/11/20 0125  WBC 10.0 8.9 9.6 11.1* 10.1  NEUTROABS 6.9  --   --   --   --   HGB 10.3* 9.1* 8.1* 8.2* 7.8*  HCT 30.8* 26.5* 24.7* 23.8* 23.9*  MCV 105.5* 103.9* 101.2* 101.3* 101.7*  PLT 163 157 168 191 199   CBG: Recent Labs  Lab 11/11/20 0651 11/11/20 1217 11/11/20 1636 11/11/20 2143 11/12/20 0631  GLUCAP 135* 158* 164* 178* 133*   Studies/Results: No results found.  Medications:  . allopurinol  100 mg Oral Daily  . Chlorhexidine Gluconate Cloth  6 each Topical Q0600   . Chlorhexidine Gluconate Cloth  6 each Topical Q0600  . darbepoetin (ARANESP) injection - DIALYSIS  100 mcg Intravenous Q Fri-HD  . docusate sodium  100 mg Oral BID  . donepezil  10 mg Oral QHS  . feeding supplement (NEPRO CARB STEADY)  237 mL Oral TID WC  . fluticasone  1 spray Each Nare Daily  . gabapentin  100 mg Oral QHS  . heparin injection (subcutaneous)  5,000 Units Subcutaneous Q8H  . insulin aspart  0-6 Units Subcutaneous TID WC  . levothyroxine  50 mcg Oral QAC breakfast  . linagliptin  5 mg Oral Daily  . loratadine  10 mg Oral Daily  . memantine  10 mg Oral BID  . midodrine  10 mg Oral Q M,W,F-HD  . multivitamin  1 tablet Oral QHS  . pantoprazole  40 mg Oral Daily  . sevelamer carbonate  2,400 mg Oral 3 times per day on Sun Tue Thu Sat  . sevelamer carbonate  2,400 mg Oral 2 times per day on Mon Wed Fri  . simvastatin  20 mg Oral  QHS    Dialysis Orders: 4hrs, BFR400, [id number], EDW 74kg,2K/2Ca Access:RU AVF Heparinnone Mircera45mcg q2wks - last 150mcg on 10/28/20  Assessment/Plan: 1. R femur Frx -s/p cephalomedullary nailing of R intertrochanteric hip Frx on 2/15 by Dr. Lucia Gaskins. Pain well controlled.  2. ESRD- On HD MWF. Next HD 2/21 3. Hypertension/volume- Blood pressure in goal. Continue home meds.On midodrine with HD.Does not appear volume overloaded. UF as tolerated.  4. Anemiaof CKD- Hgb7.8 post surgery. Received Aranesp 100 on 2/18. Follow  5. Secondary Hyperparathyroidism -Corrected Ca high. Not on VDRA, use low Ca bath.   Phos in goal, continue Renvela.  6. Nutrition- Currently on carb modified diet w/fluid restrictions.Protein supplements. Renal vitamin.  7. DM - per primary 8. Hypothyroidism- on synthroid.  9. DOE - eval by cardiology. Echo with EF 50-55%, G1DD. Plan for OP follow up. 10. Dispo: SNF vs. CIR    Lynnda Child PA-C [facility] 11/12/2020,9:34 AM

## 2020-11-12 NOTE — Progress Notes (Signed)
02/19 1952 Pt heart rate is 150 sustained, running Afib/A flutter, X, [place],Np notified. Pt denies symptoms. Pt stable. RN will continue to monitor.  02/19 2021 Pt HR is 89, converted to NSR. Pt stable.RN will continue to monitor pt.

## 2020-11-12 NOTE — Progress Notes (Signed)
[name] Progress Note   Subjective:    Dialyzed yesterday net UF 1.1 L  Not in a good mood today. Told me to leave him alone   Objective Vitals:   11/11/20 1139 11/11/20 1938 11/12/20 0419 11/12/20 0726  BP: (!) 116/47 (!) 119/44 (!) 104/48 (!) 113/41  Pulse: 76 75 76 72  Resp: 16 16 16 17   Temp:  98.5 F (36.9 C) 98.7 F (37.1 C) 98.5 F (36.9 C)  TempSrc:  Oral  Oral  SpO2: 96% 100% 93% 97%  Weight:       Physical Exam General: Alert, lying in bed, nad  Heart:RRR, no mrg Lungs: nml WOB Abdomen:soft, NTND Extremities:no LE edema Dialysis Access: RU AVF +b   Filed Weights   11/09/20 1528 11/11/20 0738 11/11/20 1047  Weight: 74.5 kg 75.1 kg 73.6 kg    Intake/Output Summary (Last 24 hours) at 11/12/2020 0952 Last data filed at 11/11/2020 1047 Gross per 24 hour  Intake -  Output 1140 ml  Net -1140 ml    Additional Objective Labs: Basic Metabolic Panel: Recent Labs  Lab 11/08/20 0319 11/09/20 1235 11/11/20 0826  NA 139 137 139  K 5.0 4.5 3.8  CL 102 98 97*  CO2 23 26 28   GLUCOSE 103* 98 147*  BUN 59* 39* 44*  CREATININE 10.97* 7.83* 8.21*  CALCIUM 9.5 9.2 9.4  PHOS 4.5 5.4* 3.5   Liver Function Tests: Recent Labs  Lab 11/07/20 1200 11/09/20 1235 11/11/20 0826  AST 21  --   --   ALT 12  --   --   ALKPHOS 83  --   --   BILITOT 0.6  --   --   PROT 6.0*  --   --   ALBUMIN 2.9* 2.2* 2.1*   CBC: Recent Labs  Lab 11/07/20 1200 11/08/20 0319 11/09/20 0447 11/10/20 0119 11/11/20 0125  WBC 10.0 8.9 9.6 11.1* 10.1  NEUTROABS 6.9  --   --   --   --   HGB 10.3* 9.1* 8.1* 8.2* 7.8*  HCT 30.8* 26.5* 24.7* 23.8* 23.9*  MCV 105.5* 103.9* 101.2* 101.3* 101.7*  PLT 163 157 168 191 199   CBG: Recent Labs  Lab 11/11/20 0651 11/11/20 1217 11/11/20 1636 11/11/20 2143 11/12/20 0631  GLUCAP 135* 158* 164* 178* 133*   Studies/Results: No results found.  Medications:  . allopurinol  100 mg Oral Daily  . Chlorhexidine  Gluconate Cloth  6 each Topical Q0600  . Chlorhexidine Gluconate Cloth  6 each Topical Q0600  . darbepoetin (ARANESP) injection - DIALYSIS  100 mcg Intravenous Q Fri-HD  . docusate sodium  100 mg Oral BID  . donepezil  10 mg Oral QHS  . feeding supplement (NEPRO CARB STEADY)  237 mL Oral TID WC  . fluticasone  1 spray Each Nare Daily  . gabapentin  100 mg Oral QHS  . heparin injection (subcutaneous)  5,000 Units Subcutaneous Q8H  . insulin aspart  0-6 Units Subcutaneous TID WC  . levothyroxine  50 mcg Oral QAC breakfast  . linagliptin  5 mg Oral Daily  . loratadine  10 mg Oral Daily  . memantine  10 mg Oral BID  . midodrine  10 mg Oral Q M,W,F-HD  . multivitamin  1 tablet Oral QHS  . pantoprazole  40 mg Oral Daily  . sevelamer carbonate  2,400 mg Oral 3 times per day on Sun Tue Thu Sat  . sevelamer carbonate  2,400 mg Oral 2 times  per day on Mon Wed Fri  . simvastatin  20 mg Oral QHS    Dialysis Orders: 4hrs, BFR400, DFR500, EDW 74kg,2K/2Ca Access:RU AVF Heparinnone Mircera19mcg q2wks - last 168mcg on 10/28/20  Assessment/Plan: 1. R femur Frx -s/p cephalomedullary nailing of R intertrochanteric hip Frx on 2/15 by Dr. Lucia Gaskins. Pain well controlled.  2. ESRD- On HD MWF. Next HD 2/21 3. Hypertension/volume- Blood pressure in goal. Continue home meds.On midodrine with HD.Does not appear volume overloaded. UF as tolerated.  4. Anemiaof CKD- Hgb7.8 post surgery. Received Aranesp 100 on 2/18. Follow  5. Secondary Hyperparathyroidism -Corrected Ca high. Not on VDRA, use low Ca bath.   Phos in goal, continue Renvela.  6. Nutrition- Currently on carb modified diet w/fluid restrictions.Protein supplements. Renal vitamin.  7. DM - per primary 8. Hypothyroidism- on synthroid.  9. DOE - eval by cardiology. Echo with EF 50-55%, G1DD. Plan for OP follow up. 10. Dispo: SNF vs. CIR    Lynnda Child PA-C [name] 11/12/2020,9:52  AM

## 2020-11-12 NOTE — Plan of Care (Signed)
  Problem: Clinical Measurements: Goal: Will remain free from infection Outcome: Progressing Goal: Diagnostic test results will improve Outcome: Progressing Goal: Cardiovascular complication will be avoided Outcome: Progressing   

## 2020-11-12 NOTE — TOC Progression Note (Signed)
Transition of Care [facility]) - Progression Note    Patient Details  Name: [name] MRN: [id number] Date of Birth: [birth year]  Transition of Care [facility]) CM/SW [place], LCSW Phone Number: 11/12/2020, 4:35 PM  Clinical Narrative:  EDCSW received phone call routed through ED from patient's wife. Per wife requested mobile phone to be primary contact. EDCSW noted request for [facility]. EDCSW re-sent referral and reiterated current bed offers. EDCSW noted family had concerns with distance. EDCSW will let covering CSW on unit know of request for follow-up    Expected Discharge Plan: [place] (vs SNF) Barriers to Discharge: Continued Medical Work up  Expected Discharge Plan and Services Expected Discharge Plan: [place] (vs SNF)   Discharge Planning Services: CM Consult                                           Social Determinants of Health (SDOH) Interventions    Readmission Risk Interventions No flowsheet data found.

## 2020-11-12 NOTE — Plan of Care (Signed)
  Problem: Education: Goal: Knowledge of General Education information will improve Description Including pain rating scale, medication(s)/side effects and non-pharmacologic comfort measures Outcome: Progressing   Problem: Health Behavior/Discharge Planning: Goal: Ability to manage health-related needs will improve Outcome: Progressing   

## 2020-11-12 NOTE — TOC Progression Note (Signed)
Transition of Care [facility]) - Progression Note    Patient Details  Name: [name] MRN: [id number] Date of Birth: 1940-10-03  Transition of Care [facility]) CM/SW [place], [place] Phone Number: 11/12/2020, 3:53 PM  Clinical Narrative:     CSW called [facility] to ask when bed would be available for pt. Admissions coordinator did not answer and CSW requested call back.   CSW called pts wife [name] and did not get answer.   Expected Discharge Plan: [place] (vs SNF) Barriers to Discharge: Continued Medical Work up  Expected Discharge Plan and Services Expected Discharge Plan: [place] (vs SNF)   Discharge Planning Services: CM Consult                                           Social Determinants of Health (SDOH) Interventions    Readmission Risk Interventions No flowsheet data found.  Emeterio Reeve, Latanya Presser, [place] Social Worker [phone]

## 2020-11-12 NOTE — Progress Notes (Signed)
PROGRESS NOTE    [name]  LZJ:[id number] DOB: 09-Oct-1940 DOA: 11/07/2020 PCP: Elby Showers, MD    Brief Narrative:  80 year old gentleman with history of ESRD on hemodialysis Monday Wednesday Friday, dementia, hypertension, insulin-dependent diabetes, diabetic neuropathy, hypothyroidism presented to the ER with fall at home 2 days ago, unable to get up by himself.  Found to have right intertrochanteric femur fracture.  Assessment & Plan:   Principal Problem:   Closed nondisplaced intertrochanteric fracture of right femur (HCC) Active Problems:   Hyperlipidemia   Essential hypertension   Hypothyroidism   BPH (benign prostatic hyperplasia)   End stage renal disease (HCC)   OSA (obstructive sleep apnea)   Diabetes mellitus type 2 in nonobese (HCC)   Anemia   Pre-operative clearance  Closed nondisplaced intertrochanteric fracture of the right femur, traumatic: Status post ORIF 2/15 Weightbearing as tolerated DVT prophylaxis with heparin subcu, aspirin on discharge Pain control with Vicodin along with laxatives. Refer to skilled nursing facility rehab as per PT OT recommendation.  ESRD on hemodialysis: Getting dialysis on his schedule.  Anemia of chronic disease: Hemoglobin is stable.  Replaced with ESA with hemodialysis.  Hyperlipidemia: Continue Zocor.  Essential hypertension: As needed hydralazine.  Sinus tachycardia: Asymptomatic episodic sinus tachycardia, no indication to treat.  Type 2 diabetes: Fairly stable.  On sliding scale.  A1c 4.9.   DVT prophylaxis: heparin injection 5,000 Units Start: 11/09/20 0600 SCDs Start: 11/08/20 1229 SCDs Start: 11/07/20 1442   Code Status: Partial Family Communication: Patient's wife on the phone 2/17. Disposition Plan: Status is: Inpatient  Remains inpatient appropriate because:Unsafe d/c plan   Dispo: The patient is from: Home              Anticipated d/c is to: SNF              Anticipated d/c date is: 1 day               Patient is currently stable to discharge to skilled level of care.   Difficult to place patient No   Patient is medically stable to transfer to a skilled level of care.      Consultants:   Orthopedics  Nephrology  Procedures:   Right hip ORIF 2/15  Antimicrobials:   None, received perioperative antibiotics   Subjective: Patient seen and examined. He is not in mood today. No overnight events noted.  Objective: Vitals:   11/11/20 1139 11/11/20 1938 11/12/20 0419 11/12/20 0726  BP: (!) 116/47 (!) 119/44 (!) 104/48 (!) 113/41  Pulse: 76 75 76 72  Resp: 16 16 16 17   Temp:  98.5 F (36.9 C) 98.7 F (37.1 C) 98.5 F (36.9 C)  TempSrc:  Oral  Oral  SpO2: 96% 100% 93% 97%  Weight:       No intake or output data in the 24 hours ending 11/12/20 1205 Filed Weights   11/09/20 1528 11/11/20 0738 11/11/20 1047  Weight: 74.5 kg 75.1 kg 73.6 kg    Examination:  General exam: Appears comfortable. Confused at times. He has dementia and forgetful.  Respiratory system: Bilateral clear. Cardiovascular system: S1 & S2 heard, RRR.  Gastrointestinal system: Soft and nontender. Central nervous system: Alert and oriented. No focal neurological deficits. Extremities: Symmetric 5 x 5 power. Psychiatry: Judgement and insight appear normal. Mood & affect flat. Right upper extremity AV fistula present. Lateral thigh incision clean and dry.    Data Reviewed: I have personally reviewed following labs and imaging studies  CBC: Recent Labs  Lab 11/07/20 1200 11/08/20 0319 11/09/20 0447 11/10/20 0119 11/11/20 0125  WBC 10.0 8.9 9.6 11.1* 10.1  NEUTROABS 6.9  --   --   --   --   HGB 10.3* 9.1* 8.1* 8.2* 7.8*  HCT 30.8* 26.5* 24.7* 23.8* 23.9*  MCV 105.5* 103.9* 101.2* 101.3* 101.7*  PLT 163 157 168 191 892   Basic Metabolic Panel: Recent Labs  Lab 11/07/20 1200 11/08/20 0319 11/09/20 1235 11/11/20 0826  NA 140 139 137 139  K 4.2 5.0 4.5 3.8  CL 101 102 98  97*  CO2 26 23 26 28   GLUCOSE 134* 103* 98 147*  BUN 49* 59* 39* 44*  CREATININE 10.06* 10.97* 7.83* 8.21*  CALCIUM 9.8 9.5 9.2 9.4  PHOS  --  4.5 5.4* 3.5   GFR: Estimated Creatinine Clearance: 7.6 mL/min (A) (by C-G formula based on SCr of 8.21 mg/dL (H)). Liver Function Tests: Recent Labs  Lab 11/07/20 1200 11/09/20 1235 11/11/20 0826  AST 21  --   --   ALT 12  --   --   ALKPHOS 83  --   --   BILITOT 0.6  --   --   PROT 6.0*  --   --   ALBUMIN 2.9* 2.2* 2.1*   No results for input(s): LIPASE, AMYLASE in the last 168 hours. No results for input(s): AMMONIA in the last 168 hours. Coagulation Profile: No results for input(s): INR, PROTIME in the last 168 hours. Cardiac Enzymes: No results for input(s): CKTOTAL, CKMB, CKMBINDEX, TROPONINI in the last 168 hours. BNP (last 3 results) No results for input(s): PROBNP in the last 8760 hours. HbA1C: No results for input(s): HGBA1C in the last 72 hours. CBG: Recent Labs  Lab 11/11/20 1217 11/11/20 1636 11/11/20 2143 11/12/20 0631 11/12/20 1117  GLUCAP 158* 164* 178* 133* 143*   Lipid Profile: No results for input(s): CHOL, HDL, LDLCALC, TRIG, CHOLHDL, LDLDIRECT in the last 72 hours. Thyroid Function Tests: No results for input(s): TSH, T4TOTAL, FREET4, T3FREE, THYROIDAB in the last 72 hours. Anemia Panel: No results for input(s): VITAMINB12, FOLATE, FERRITIN, TIBC, IRON, RETICCTPCT in the last 72 hours. Sepsis Labs: No results for input(s): PROCALCITON, LATICACIDVEN in the last 168 hours.  Recent Results (from the past 240 hour(s))  Resp Panel by RT-PCR (Flu A&B, Covid) Nasopharyngeal Swab     Status: None   Collection Time: 11/07/20  1:38 PM   Specimen: Nasopharyngeal Swab; Nasopharyngeal(NP) swabs in vial transport medium  Result Value Ref Range Status   SARS Coronavirus 2 by RT PCR NEGATIVE NEGATIVE Final    Comment: (NOTE) SARS-CoV-2 target nucleic acids are NOT DETECTED.  The SARS-CoV-2 RNA is generally  detectable in upper respiratory specimens during the acute phase of infection. The lowest concentration of SARS-CoV-2 viral copies this assay can detect is 138 copies/mL. A negative result does not preclude SARS-Cov-2 infection and should not be used as the sole basis for treatment or other patient management decisions. A negative result may occur with  improper specimen collection/handling, submission of specimen other than nasopharyngeal swab, presence of viral mutation(s) within the areas targeted by this assay, and inadequate number of viral copies(<138 copies/mL). A negative result must be combined with clinical observations, patient history, and epidemiological information. The expected result is Negative.  Fact Sheet for Patients:  EntrepreneurPulse.com.au  Fact Sheet for Healthcare Providers:  IncredibleEmployment.be  This test is no t yet approved or cleared by the [place] FDA and  has  been authorized for detection and/or diagnosis of SARS-CoV-2 by FDA under an Emergency Use Authorization (EUA). This EUA will remain  in effect (meaning this test can be used) for the duration of the COVID-19 declaration under Section 564(b)(1) of the Act, 21 U.S.C.section 360bbb-3(b)(1), unless the authorization is terminated  or revoked sooner.       Influenza A by PCR NEGATIVE NEGATIVE Final   Influenza B by PCR NEGATIVE NEGATIVE Final    Comment: (NOTE) The Xpert Xpress SARS-CoV-2/FLU/RSV plus assay is intended as an aid in the diagnosis of influenza from Nasopharyngeal swab specimens and should not be used as a sole basis for treatment. Nasal washings and aspirates are unacceptable for Xpert Xpress SARS-CoV-2/FLU/RSV testing.  Fact Sheet for Patients: EntrepreneurPulse.com.au  Fact Sheet for Healthcare Providers: IncredibleEmployment.be  This test is not yet approved or cleared by the [place] FDA  and has been authorized for detection and/or diagnosis of SARS-CoV-2 by FDA under an Emergency Use Authorization (EUA). This EUA will remain in effect (meaning this test can be used) for the duration of the COVID-19 declaration under Section 564(b)(1) of the Act, 21 U.S.C. section 360bbb-3(b)(1), unless the authorization is terminated or revoked.  Performed at [facility], [place] [address], [place], [place] [address]   Surgical pcr screen     Status: None   Collection Time: 11/08/20 12:13 AM   Specimen: Nasal Mucosa; Nasal Swab  Result Value Ref Range Status   MRSA, PCR NEGATIVE NEGATIVE Final   Staphylococcus aureus NEGATIVE NEGATIVE Final    Comment: (NOTE) The Xpert SA Assay (FDA approved for NASAL specimens in patients 66 years of age and older), is one component of a comprehensive surveillance program. It is not intended to diagnose infection nor to guide or monitor treatment. Performed at [facility], [place] [address], [place], [place] [address]          Radiology Studies: No results found.      Scheduled Meds: . allopurinol  100 mg Oral Daily  . Chlorhexidine Gluconate Cloth  6 each Topical Q0600  . Chlorhexidine Gluconate Cloth  6 each Topical Q0600  . darbepoetin (ARANESP) injection - DIALYSIS  100 mcg Intravenous Q Fri-HD  . docusate sodium  100 mg Oral BID  . donepezil  10 mg Oral QHS  . feeding supplement (NEPRO CARB STEADY)  237 mL Oral TID WC  . fluticasone  1 spray Each Nare Daily  . gabapentin  100 mg Oral QHS  . heparin injection (subcutaneous)  5,000 Units Subcutaneous Q8H  . insulin aspart  0-6 Units Subcutaneous TID WC  . levothyroxine  50 mcg Oral QAC breakfast  . linagliptin  5 mg Oral Daily  . loratadine  10 mg Oral Daily  . memantine  10 mg Oral BID  . midodrine  10 mg Oral Q M,W,F-HD  . multivitamin  1 tablet Oral QHS  . pantoprazole  40 mg Oral Daily  . sevelamer carbonate  2,400 mg Oral 3 times per day on Sun Tue Thu Sat   . sevelamer carbonate  2,400 mg Oral 2 times per day on Mon Wed Fri  . simvastatin  20 mg Oral QHS   Continuous Infusions:   LOS: 5 days    Time spent: 25 minutes    Barb Merino, MD [facility] Pager [phone]

## 2020-11-13 DIAGNOSIS — I10 Essential (primary) hypertension: Secondary | ICD-10-CM

## 2020-11-13 DIAGNOSIS — S72144A Nondisplaced intertrochanteric fracture of right femur, initial encounter for closed fracture: Secondary | ICD-10-CM | POA: Diagnosis not present

## 2020-11-13 DIAGNOSIS — E039 Hypothyroidism, unspecified: Secondary | ICD-10-CM

## 2020-11-13 DIAGNOSIS — N186 End stage renal disease: Secondary | ICD-10-CM | POA: Diagnosis not present

## 2020-11-13 DIAGNOSIS — N400 Enlarged prostate without lower urinary tract symptoms: Secondary | ICD-10-CM | POA: Diagnosis not present

## 2020-11-13 LAB — GLUCOSE, CAPILLARY
Glucose-Capillary: 121 mg/dL — ABNORMAL HIGH (ref 70–99)
Glucose-Capillary: 207 mg/dL — ABNORMAL HIGH (ref 70–99)
Glucose-Capillary: 114 mg/dL — ABNORMAL HIGH (ref 70–99)
Glucose-Capillary: 139 mg/dL — ABNORMAL HIGH (ref 70–99)

## 2020-11-13 LAB — SARS CORONAVIRUS 2 (TAT 6-24 HRS): SARS Coronavirus 2: NEGATIVE

## 2020-11-13 MED ORDER — METOPROLOL TARTRATE 25 MG PO TABS: 12.5000 mg | ORAL_TABLET | Freq: Two times a day (BID) | ORAL | Status: DC

## 2020-11-13 MED ORDER — METOPROLOL TARTRATE 12.5 MG HALF TABLET
12.5000 mg | ORAL_TABLET | Freq: Two times a day (BID) | ORAL | Status: DC
  Administered 2020-11-13 – 2020-11-16 (×7): 12.5 mg via ORAL
  Filled 2020-11-13 (×7): qty 1

## 2020-11-13 NOTE — TOC Progression Note (Addendum)
Transition of Care [facility]) - Progression Note    Patient Details  Name: [name] MRN: [id number] Date of Birth: 1940-11-12  Transition of Care [facility]) CM/SW [place], [place] Phone Number: 11/13/2020, 9:46 AM  Clinical Narrative:       CSW spoke with patients spouse [name]. CSW let patients spouse know that CSW was unable to speak with [facility] admissions that they will be back in tomorrow morning. CSW also went over other SNF bed offers with patients spouse. Patients spouse number one choice is [facility]. It is close to where she and patient live. Patients spouse wants to go over other SNF bed offers with family to have second choice in case [facility] cannot offer. Patients spouse will have second SNF choice for CSW tomorrow. Patients spouse does not want to accept [facility] SNF bed offer due to it being to far from where patient and spouse live. Patients spouse is going to look over other three SNF bed offers given. . CSW will follow up with [facility] in the morning to see if they can make SNF bed offer.Other pending facility's that offered CSW checked in with them to see if they can accept patient today. All pending SNF offers confirmed cannot accept patient until tomorrow. CSW will follow up with patients spouse in the morning for SNF choice. Patient is HD. His dialysis days are Monday,Wednesday, and Friday. At [facility]. Seat time is at 11:50am.  TOC team will continue to follow.    Expected Discharge Plan: [facility] (vs SNF) Barriers to Discharge: Continued Medical Work up  Expected Discharge Plan and Services Expected Discharge Plan: [place] (vs SNF)   Discharge Planning Services: CM Consult                                           Social Determinants of Health (SDOH) Interventions    Readmission Risk Interventions No flowsheet data found.

## 2020-11-13 NOTE — Progress Notes (Signed)
[facility] Progress Note   Subjective:    Uncomfortable in bed d/t hip pain.  Has customer service complaints, wants to go home.   Objective Vitals:   11/12/20 1934 11/13/20 0300 11/13/20 0732 11/13/20 0946  BP: 114/60 (!) 115/58 (!) 136/43 (!) 136/43  Pulse: 74 70 72 72  Resp: 16 16 18    Temp: 98 F (36.7 C) 98.3 F (36.8 C) 98.6 F (37 C)   TempSrc:  Axillary    SpO2: 97% 97% 100%   Weight:       Physical Exam General: Alert, lying in bed, nad  Heart:RRR, no mrg Lungs: nml WOB Abdomen:soft, NTND Extremities:no LE edema Dialysis Access: RU AVF +b   Filed Weights   11/09/20 1528 11/11/20 0738 11/11/20 1047  Weight: 74.5 kg 75.1 kg 73.6 kg   No intake or output data in the 24 hours ending 11/13/20 1015  Additional Objective Labs: Basic Metabolic Panel: Recent Labs  Lab 11/08/20 0319 11/09/20 1235 11/11/20 0826  NA 139 137 139  K 5.0 4.5 3.8  CL 102 98 97*  CO2 23 26 28   GLUCOSE 103* 98 147*  BUN 59* 39* 44*  CREATININE 10.97* 7.83* 8.21*  CALCIUM 9.5 9.2 9.4  PHOS 4.5 5.4* 3.5   Liver Function Tests: Recent Labs  Lab 11/07/20 1200 11/09/20 1235 11/11/20 0826  AST 21  --   --   ALT 12  --   --   ALKPHOS 83  --   --   BILITOT 0.6  --   --   PROT 6.0*  --   --   ALBUMIN 2.9* 2.2* 2.1*   CBC: Recent Labs  Lab 11/07/20 1200 11/08/20 0319 11/09/20 0447 11/10/20 0119 11/11/20 0125  WBC 10.0 8.9 9.6 11.1* 10.1  NEUTROABS 6.9  --   --   --   --   HGB 10.3* 9.1* 8.1* 8.2* 7.8*  HCT 30.8* 26.5* 24.7* 23.8* 23.9*  MCV 105.5* 103.9* 101.2* 101.3* 101.7*  PLT 163 157 168 191 199   CBG: Recent Labs  Lab 11/12/20 0631 11/12/20 1117 11/12/20 1629 11/12/20 1936 11/13/20 0645  GLUCAP 133* 143* 215* 133* 121*   Studies/Results: No results found.  Medications:  . allopurinol  100 mg Oral Daily  . Chlorhexidine Gluconate Cloth  6 each Topical Q0600  . Chlorhexidine Gluconate Cloth  6 each Topical Q0600  . darbepoetin  (ARANESP) injection - DIALYSIS  100 mcg Intravenous Q Fri-HD  . docusate sodium  100 mg Oral BID  . donepezil  10 mg Oral QHS  . feeding supplement (NEPRO CARB STEADY)  237 mL Oral TID WC  . fluticasone  1 spray Each Nare Daily  . gabapentin  100 mg Oral QHS  . heparin injection (subcutaneous)  5,000 Units Subcutaneous Q8H  . insulin aspart  0-6 Units Subcutaneous TID WC  . levothyroxine  50 mcg Oral QAC breakfast  . linagliptin  5 mg Oral Daily  . loratadine  10 mg Oral Daily  . memantine  10 mg Oral BID  . metoprolol tartrate  12.5 mg Oral BID  . midodrine  10 mg Oral Q M,W,F-HD  . multivitamin  1 tablet Oral QHS  . pantoprazole  40 mg Oral Daily  . sevelamer carbonate  2,400 mg Oral 3 times per day on Sun Tue Thu Sat  . sevelamer carbonate  2,400 mg Oral 2 times per day on Mon Wed Fri  . simvastatin  20 mg Oral QHS  Dialysis Orders: 4hrs, BFR400, [id number], EDW 74kg,2K/2Ca Access:RU AVF Heparinnone Mircera69mcg q2wks - last 160mcg on 10/28/20  Assessment/Plan: 1. R femur Frx -s/p cephalomedullary nailing of R intertrochanteric hip Frx on 2/15 by Dr. Lucia Gaskins.  2. ESRD- On HD MWF. Next HD 2/21 3. Hypertension/volume- Blood pressure in goal. Continue home meds.On midodrine with HD.Does not appear volume overloaded. UF as tolerated.  4. Anemiaof CKD- Hgb7.8 post surgery. Received Aranesp 100 on 2/18. Follow  5. Secondary Hyperparathyroidism -Corrected Ca high. Not on VDRA, use low Ca bath.   Phos in goal, continue Renvela.  6. Nutrition- Currently on carb modified diet w/fluid restrictions.Protein supplements. Renal vitamin.  7. DM - per primary 8. Hypothyroidism- on synthroid.  9. DOE - eval by cardiology. Echo with EF 50-55%, G1DD. Plan for OP follow up. 10. Dispo: SNF vs. CIR    Arzell Mcgeehan Larina Earthly PA-C [facility] 11/13/2020,10:15 AM

## 2020-11-13 NOTE — Discharge Summary (Signed)
Physician Discharge Summary  [name] VQM:[id number] DOB: 06-25-1941 DOA: 11/07/2020  PCP: Elby Showers, MD  Admit date: 11/07/2020 Discharge date: 11/13/2020  Admitted From: Home Disposition: Skilled nursing facility  Recommendations for Outpatient Follow-up:  1. Follow up with PCP in 1-2 weeks after discharge. 2. Please obtain BMP/CBC in one week 3. Continue hemodialysis as a scheduled. 4. Orthopedics to schedule follow-up.  Discharge Condition: Stable CODE STATUS: Partial code, he will be full code for CPR purpose. 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  Found to have right intertrochanteric femur fracture.  Assessment & Plan of care:   1. Closed nondisplaced intertrochanteric fracture of the right femur, traumatic: Status post ORIF 2/15 Weightbearing as tolerated DVT prophylaxis with heparin subcu, aspirin on discharge Pain control with Vicodin along with laxatives. Refer to skilled nursing facility rehab as per PT OT recommendation.  2. ESRD on hemodialysis: Getting dialysis on his schedule.  3. Anemia of chronic disease: Hemoglobin is stable.  Replaced with ESA with hemodialysis.  4. Hyperlipidemia: Continue Zocor.  5. Essential hypertension: As needed hydralazine.  6. Sinus tachycardia: Asymptomatic episodic atrial tachycardia.  Will start patient on low-dose beta-blockers.  7. Type 2 diabetes: Fairly stable.  On sliding scale with carb count and Tradjenta. A1c 4.9.  Patient is medically stable.  Please transfer to skilled nursing facility whenever bed is available.    Discharge Diagnoses:  Principal Problem:   Closed nondisplaced intertrochanteric fracture of right femur ([place]) Active  Problems:   Hyperlipidemia   Essential hypertension   Hypothyroidism   BPH (benign prostatic hyperplasia)   End stage renal disease (HCC)   OSA (obstructive sleep apnea)   Diabetes mellitus type 2 in nonobese (HCC)   Anemia   Pre-operative clearance    Discharge Instructions  Discharge Instructions    Call MD for:  redness, tenderness, or signs of infection (pain, swelling, redness, odor or green/yellow discharge around incision site)   Complete by: As directed    Call MD for:  severe uncontrolled pain   Complete by: As directed    Call MD for:  temperature >100.4   Complete by: As directed    Diet - low sodium heart healthy   Complete by: As directed    Increase activity slowly   Complete by: As directed    Increase activity slowly   Complete by: As directed    No dressing needed   Complete by: As directed    No dressing needed   Complete by: As directed      Allergies as of 11/13/2020      Reactions   Ambien [zolpidem Tartrate] Other (See Comments)   Hallucinations and "felt crazy"    Penicillins Rash, Hives   Has patient had a PCN reaction causing immediate rash, facial/tongue/throat swelling, SOB or lightheadedness with hypotension: Yes Has patient had a PCN reaction causing severe rash involving mucus membranes or skin necrosis: Yes Has patient had a PCN reaction that required hospitalization: No Has patient had a PCN reaction occurring within the last 10 years: No If all of the above answers are "NO", then may proceed with Cephalosporin use. Other reaction(s): HIVES      Medication List    STOP taking these medications   dicyclomine 20 MG tablet Commonly known as:  BENTYL     TAKE these medications   acetaminophen 325 MG tablet Commonly known as: TYLENOL Take 1-2 tablets (325-650 mg total) by mouth every 4 (four) hours as needed for mild pain. What changed:   how much to take  when to take this   albuterol 108 (90 Base) MCG/ACT inhaler Commonly  known as: VENTOLIN HFA Inhale 1 puff into the lungs daily as needed for wheezing or shortness of breath.   allopurinol 100 MG tablet Commonly known as: ZYLOPRIM Take 100 mg by mouth daily.   aspirin EC 325 MG tablet Take 1 tablet (325 mg total) by mouth daily.   camphor-menthol lotion Commonly known as: SARNA Apply 1 application topically 4 (four) times daily as needed for itching.   cetirizine 10 MG chewable tablet Commonly known as: ZYRTEC Chew 10 mg by mouth every Monday, Wednesday, and Friday.   clobetasol 0.05 % external solution Commonly known as: TEMOVATE Apply 1 application topically 2 (two) times daily as needed (scalp irritation.).   denosumab 60 MG/ML Sosy injection Commonly known as: PROLIA Inject 60 mg into the skin every 6 (six) months.   docusate sodium 100 MG capsule Commonly known as: COLACE Take 1 capsule (100 mg total) by mouth 2 (two) times daily.   donepezil 10 MG tablet Commonly known as: ARICEPT Take 1 tablet (10 mg total) by mouth at bedtime.   feeding supplement (NEPRO CARB STEADY) Liqd Take 237 mLs by mouth in the morning and at bedtime.   Fluocinolone Acetonide Scalp 0.01 % Oil Apply 1 application topically daily as needed (scalp irritation.).   fluticasone 50 MCG/ACT nasal spray Commonly known as: FLONASE Place 1 spray into both nostrils daily. What changed:   when to take this  reasons to take this   FreeStyle Freedom Lite w/Device Kit Use to check blood sugar 2 times per day dx code E11.65   freestyle lancets Use as instructed to check blood sugar 2 times per day dx code E11.65   FREESTYLE LITE test strip Generic drug: glucose blood USE THREE TIMES DAILY   gabapentin 100 MG capsule Commonly known as: NEURONTIN Take 100 mg by mouth at bedtime.   HYDROcodone-acetaminophen 5-325 MG tablet Commonly known as: NORCO/VICODIN Take 1-2 tablets by mouth every 6 (six) hours as needed for up to 5 days for moderate pain.    hydrocortisone 2.5 % cream Apply 1 application topically 3 (three) times daily as needed (skin irritation (legs & arms)).   insulin aspart 100 UNIT/ML FlexPen Commonly known as: NovoLOG FlexPen INJECT 6-8 UNITS UNDER THE SKIN THREE TIMES DAILY BEFORE MEALS. DX:E11.65 What changed:   how much to take  how to take this  when to take this  additional instructions   ketoconazole 2 % shampoo Commonly known as: Nizoral Apply 1 application topically 2 (two) times a week. What changed:   when to take this  reasons to take this   levothyroxine 50 MCG tablet Commonly known as: SYNTHROID Take 1 tablet (50 mcg total) by mouth daily. What changed: when to take this   lidocaine 5 % Commonly known as: LIDODERM Place 1 patch onto the skin daily as needed (back pain).   memantine 10 MG tablet Commonly known as: NAMENDA Take 1 tablet (10 mg total) by mouth 2 (two) times daily.   metoprolol tartrate 25 MG tablet Commonly known as: LOPRESSOR Take 0.5 tablets (12.5 mg total) by mouth 2 (two) times daily.   midodrine 10 MG tablet Commonly known as: PROAMATINE  Take one pill prior to hemodialysis on MWF What changed:   how much to take  how to take this  when to take this  additional instructions   omeprazole 40 MG capsule Commonly known as: PRILOSEC Take 1 capsule (40 mg total) by mouth daily. What changed: when to take this   Pen Needles 32G X 4 MM Misc 1 each by Does not apply route 3 (three) times daily. USE PEN NEEDLES TO INJECT INSULIN THREE TIMES DAILY.   sevelamer carbonate 800 MG tablet Commonly known as: RENVELA Take 1,600-2,400 mg by mouth See admin instructions. Take 2,400 mg by mouth three times a day with meals and 1,600 mg with snacks on Sun/Tues/Thurs/Sat and 2,400 mg with breakfast and dinner and nothing with snacks on Mon/Wed/Fri   simvastatin 20 MG tablet Commonly known as: ZOCOR Take 20 mg by mouth at bedtime.   Theratears 0.25 % Soln Generic  drug: Carboxymethylcellulose Sodium Place 1 drop into both eyes as needed (for dryness).   Tradjenta 5 MG Tabs tablet Generic drug: linagliptin TAKE 1 TABLET DAILY What changed: how much to take            Discharge Care Instructions  (From admission, onward)         Start     Ordered   11/13/20 0000  No dressing needed        11/13/20 1343   11/10/20 0000  No dressing needed        11/10/20 1524          Allergies  Allergen Reactions  . Ambien [Zolpidem Tartrate] Other (See Comments)    Hallucinations and "felt crazy"   . Penicillins Rash and Hives    Has patient had a PCN reaction causing immediate rash, facial/tongue/throat swelling, SOB or lightheadedness with hypotension: Yes Has patient had a PCN reaction causing severe rash involving mucus membranes or skin necrosis: Yes Has patient had a PCN reaction that required hospitalization: No Has patient had a PCN reaction occurring within the last 10 years: No If all of the above answers are "NO", then may proceed with Cephalosporin use.  Other reaction(s): HIVES    Consultations:  Orthopedics  Nephrology   Procedures/Studies: DG Chest 1 View  Result Date: 11/07/2020 CLINICAL DATA:  Pain following fall EXAM: CHEST  1 VIEW COMPARISON:  May 26, 2020 FINDINGS: There is slight left base atelectasis. Lungs elsewhere clear. Heart size and pulmonary vascularity are within normal limits. There is aortic atherosclerosis. No evident adenopathy. No bone lesions. IMPRESSION: Slight left base atelectasis. Lungs otherwise clear. Heart size normal. Aortic Atherosclerosis (ICD10-I70.0). Electronically Signed   By: Lowella Grip III M.D.   On: 11/07/2020 11:51   CT Head Wo Contrast  Result Date: 11/07/2020 CLINICAL DATA:  Fall. EXAM: CT HEAD WITHOUT CONTRAST CT CERVICAL SPINE WITHOUT CONTRAST TECHNIQUE: Multidetector CT imaging of the head and cervical spine was performed following the standard protocol without  intravenous contrast. Multiplanar CT image reconstructions of the cervical spine were also generated. COMPARISON:  May 26, 2020. FINDINGS: CT HEAD FINDINGS Brain: Stable bilateral frontal chronic hygromas are noted, left greater than right. Mild diffuse cortical atrophy is noted. Ventricular size is within normal limits. No evidence of acute hemorrhage, infarction or intracranial mass lesion is noted. Vascular: No hyperdense vessel or unexpected calcification. Skull: Status post bilateral frontal craniotomies. No acute osseous abnormality is noted. Sinuses/Orbits: No acute finding. Other: None. CT CERVICAL SPINE FINDINGS Alignment: Normal. Skull base and vertebrae: Old C7 fracture  is noted. No acute fracture is noted. Soft tissues and spinal canal: No prevertebral fluid or swelling. No visible canal hematoma. Disc levels: Moderate degenerative disc disease is noted at C3-4, C4-5, C5-6, C6-7 and C7-T1. Upper chest: Negative. Other: None. IMPRESSION: 1. Stable bilateral frontal chronic hygromas are noted, left greater than right. Mild diffuse cortical atrophy. No acute intracranial abnormality seen. 2. Multilevel degenerative disc disease. No acute abnormality seen in the cervical spine. Electronically Signed   By: Marijo Conception M.D.   On: 11/07/2020 12:00   CT Cervical Spine Wo Contrast  Result Date: 11/07/2020 CLINICAL DATA:  Fall. EXAM: CT HEAD WITHOUT CONTRAST CT CERVICAL SPINE WITHOUT CONTRAST TECHNIQUE: Multidetector CT imaging of the head and cervical spine was performed following the standard protocol without intravenous contrast. Multiplanar CT image reconstructions of the cervical spine were also generated. COMPARISON:  May 26, 2020. FINDINGS: CT HEAD FINDINGS Brain: Stable bilateral frontal chronic hygromas are noted, left greater than right. Mild diffuse cortical atrophy is noted. Ventricular size is within normal limits. No evidence of acute hemorrhage, infarction or intracranial mass  lesion is noted. Vascular: No hyperdense vessel or unexpected calcification. Skull: Status post bilateral frontal craniotomies. No acute osseous abnormality is noted. Sinuses/Orbits: No acute finding. Other: None. CT CERVICAL SPINE FINDINGS Alignment: Normal. Skull base and vertebrae: Old C7 fracture is noted. No acute fracture is noted. Soft tissues and spinal canal: No prevertebral fluid or swelling. No visible canal hematoma. Disc levels: Moderate degenerative disc disease is noted at C3-4, C4-5, C5-6, C6-7 and C7-T1. Upper chest: Negative. Other: None. IMPRESSION: 1. Stable bilateral frontal chronic hygromas are noted, left greater than right. Mild diffuse cortical atrophy. No acute intracranial abnormality seen. 2. Multilevel degenerative disc disease. No acute abnormality seen in the cervical spine. Electronically Signed   By: Marijo Conception M.D.   On: 11/07/2020 12:00   DG C-Arm 1-60 Min  Result Date: 11/08/2020 CLINICAL DATA:  Surgery, elective. Additional history provided: Right troch nail. Provided fluoroscopy time 59 seconds (15.02 mGy). EXAM: OPERATIVE right HIP (WITH PELVIS IF PERFORMED) 6 VIEWS TECHNIQUE: Fluoroscopic spot image(s) were submitted for interpretation post-operatively. COMPARISON:  Radiographs of the right hip 11/07/2020. FINDINGS: Six intraoperative fluoroscopic images of the right hip/femur are submitted. The images demonstrate a femoral intramedullary nail with proximal and distal interlocking screws, traversing a known intertrochanteric right hip fracture. Alignment appears near anatomic. IMPRESSION: Six intraoperative fluoroscopic images from right femoral ORIF for an intratrochanteric right hip fracture, as described. Electronically Signed   By: Kellie Simmering DO   On: 11/08/2020 12:51   ECHOCARDIOGRAM COMPLETE  Result Date: 11/07/2020    ECHOCARDIOGRAM REPORT   Patient Name:   CRISTOVAL TEALL Sanzo Date of Exam: 11/07/2020 Medical Rec #:  [id number]      Height:       71.0 in  Accession #:    [id number]     Weight:       164.2 lb Date of Birth:  September 13, 1941      BSA:          1.939 m Patient Age:    15 years       BP:           156/59 mmHg Patient Gender: M              HR:           73 bpm. Exam Location:  Inpatient Procedure: 2D Echo, Cardiac Doppler and Color Doppler Indications:  CHF-Acute Diastolic L84.53  History:        Patient has prior history of Echocardiogram examinations, most                 recent 04/29/2018. CAD, PAD, Signs/Symptoms:Dyspnea; Risk                 Factors:Diabetes, Hypertension, Dyslipidemia and Sleep Apnea.                 Thyroid disease. Renal insufficiency. End stage renal disease.                 Mild dementia. Hip fracture.  Sonographer:    Darlina Sicilian RDCS Referring Phys: [id number] [place]  1. Left ventricular ejection fraction, by estimation, is 55 to 60%. The left ventricle has normal function. The left ventricle has no regional wall motion abnormalities. There is mild left ventricular hypertrophy. Left ventricular diastolic parameters are consistent with Grade I diastolic dysfunction (impaired relaxation).  2. Right ventricular systolic function is normal. The right ventricular size is normal. There is mildly elevated pulmonary artery systolic pressure. The estimated right ventricular systolic pressure is 12.2 mmHg.  3. The mitral valve is normal in structure. Trivial mitral valve regurgitation. No evidence of mitral stenosis.  4. The aortic valve is tricuspid. Aortic valve regurgitation is not visualized. Mild aortic valve sclerosis is present, with no evidence of aortic valve stenosis.  5. The inferior vena cava is normal in size with greater than 50% respiratory variability, suggesting right atrial pressure of 3 mmHg. FINDINGS  Left Ventricle: Left ventricular ejection fraction, by estimation, is 55 to 60%. The left ventricle has normal function. The left ventricle has no regional wall motion abnormalities. The left ventricular  internal cavity size was normal in size. There is  mild left ventricular hypertrophy. Left ventricular diastolic parameters are consistent with Grade I diastolic dysfunction (impaired relaxation). Right Ventricle: The right ventricular size is normal. No increase in right ventricular wall thickness. Right ventricular systolic function is normal. There is mildly elevated pulmonary artery systolic pressure. The tricuspid regurgitant velocity is 3.05  m/s, and with an assumed right atrial pressure of 3 mmHg, the estimated right ventricular systolic pressure is 48.2 mmHg. Left Atrium: Left atrial size was normal in size. Right Atrium: Right atrial size was normal in size. Pericardium: There is no evidence of pericardial effusion. Mitral Valve: The mitral valve is normal in structure. Trivial mitral valve regurgitation. No evidence of mitral valve stenosis. Tricuspid Valve: The tricuspid valve is normal in structure. Tricuspid valve regurgitation is mild. Aortic Valve: The aortic valve is tricuspid. Aortic valve regurgitation is not visualized. Mild aortic valve sclerosis is present, with no evidence of aortic valve stenosis. Pulmonic Valve: The pulmonic valve was normal in structure. Pulmonic valve regurgitation is not visualized. Aorta: The aortic root is normal in size and structure. Venous: The inferior vena cava is normal in size with greater than 50% respiratory variability, suggesting right atrial pressure of 3 mmHg. IAS/Shunts: No atrial level shunt detected by color flow Doppler.  LEFT VENTRICLE PLAX 2D LVIDd:         3.40 cm  Diastology LVIDs:         2.40 cm  LV e' medial:    7.07 cm/s LV PW:         1.20 cm  LV E/e' medial:  13.0 LV IVS:        1.50 cm  LV e' lateral:   8.59 cm/s  LVOT diam:     1.90 cm  LV E/e' lateral: 10.7 LV SV:         83 LV SV Index:   43 LVOT Area:     2.84 cm  RIGHT VENTRICLE RV S prime:     17.90 cm/s TAPSE (M-mode): 2.1 cm LEFT ATRIUM             Index       RIGHT ATRIUM           Index LA diam:        2.75 cm 1.42 cm/m  RA Area:     9.54 cm LA Vol (A2C):   33.0 ml 17.02 ml/m RA Volume:   16.20 ml 8.35 ml/m LA Vol (A4C):   46.8 ml 24.13 ml/m LA Biplane Vol: 41.1 ml 21.19 ml/m  AORTIC VALVE LVOT Vmax:   127.00 cm/s LVOT Vmean:  86.800 cm/s LVOT VTI:    0.291 m  AORTA Ao Root diam: 3.60 cm Ao Asc diam:  3.40 cm MITRAL VALVE                TRICUSPID VALVE MV Area (PHT): 4.41 cm     TR Peak grad:   37.2 mmHg MV Decel Time: 172 msec     TR Vmax:        305.00 cm/s MV E velocity: 91.80 cm/s MV A velocity: 122.00 cm/s  SHUNTS MV E/A ratio:  0.75         Systemic VTI:  0.29 m                             Systemic Diam: 1.90 cm Loralie Champagne MD Electronically signed by Loralie Champagne MD Signature Date/Time: 11/07/2020/5:40:54 PM    Final    DG HIP OPERATIVE UNILAT W OR W/O PELVIS RIGHT  Result Date: 11/08/2020 CLINICAL DATA:  Surgery, elective. Additional history provided: Right troch nail. Provided fluoroscopy time 59 seconds (15.02 mGy). EXAM: OPERATIVE right HIP (WITH PELVIS IF PERFORMED) 6 VIEWS TECHNIQUE: Fluoroscopic spot image(s) were submitted for interpretation post-operatively. COMPARISON:  Radiographs of the right hip 11/07/2020. FINDINGS: Six intraoperative fluoroscopic images of the right hip/femur are submitted. The images demonstrate a femoral intramedullary nail with proximal and distal interlocking screws, traversing a known intertrochanteric right hip fracture. Alignment appears near anatomic. IMPRESSION: Six intraoperative fluoroscopic images from right femoral ORIF for an intratrochanteric right hip fracture, as described. Electronically Signed   By: Kellie Simmering DO   On: 11/08/2020 12:51   DG Hip Unilat W or Wo Pelvis 2-3 Views Right  Result Date: 11/07/2020 CLINICAL DATA:  Pain following fall EXAM: DG HIP (WITH OR WITHOUT PELVIS) 2-3V RIGHT COMPARISON:  None. FINDINGS: Frontal pelvis as well as frontal and lateral right hip images were obtained. There is an  intertrochanteric femur fracture on the right with slight impaction at the fracture site. No other fracture. No dislocation. There is severe narrowing of the right hip joint with moderate narrowing of the left hip joint. No erosion. There is extensive distal aortic and iliac artery atherosclerotic calcification. There is also common femoral and superficial femoral artery atherosclerotic calcification bilaterally. There are surgical clips in each inguinal region. IMPRESSION: Intertrochanteric femur fracture on the right with mild impaction at the fracture site. No other acute fracture. No dislocation. 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 There is multifocal atherosclerotic arterial vascular  calcification. Electronically Signed   By: Lowella Grip III M.D.   On: 11/07/2020 11:47    (Echo, Carotid, EGD, Colonoscopy, ERCP)    Subjective: Patient seen and examined.  No overnight events.  Pleasant.  Poor historian.  Does complain of some pain on his right leg and was asking why he is not able to walk well.   Discharge Exam: Vitals:   11/13/20 0946 11/13/20 1208  BP: (!) 136/43 (!) 101/40  Pulse: 72 69  Resp:  18  Temp:  98.7 F (37.1 C)  SpO2:  99%   Vitals:   11/13/20 0732 11/13/20 0946 11/13/20 1126 11/13/20 1208  BP: (!) 136/43 (!) 136/43  (!) 101/40  Pulse: 72 72  69  Resp: 18   18  Temp: 98.6 F (37 C)   98.7 F (37.1 C)  TempSrc:      SpO2: 100%   99%  Weight:   73.6 kg   Height:   5' 10"  (1.778 m)     General: Pt is alert, awake, not in acute distress Patient is alert awake and oriented x1.  Is pleasantly confused due to his dementia. Cardiovascular: RRR, S1/S2 +, no rubs, no gallops Respiratory: CTA bilaterally, no wheezing, no rhonchi Abdominal: Soft, NT, ND, bowel sounds + Extremities:  Right upper extremity AV fistula present with thrill. Right thigh incision clean and dry.    The results of significant  diagnostics from this hospitalization (including imaging, microbiology, ancillary and laboratory) are listed below for reference.     Microbiology: Recent Results (from the past 240 hour(s))  Resp Panel by RT-PCR (Flu A&B, Covid) Nasopharyngeal Swab     Status: None   Collection Time: 11/07/20  1:38 PM   Specimen: Nasopharyngeal Swab; Nasopharyngeal(NP) swabs in vial transport medium  Result Value Ref Range Status   SARS Coronavirus 2 by RT PCR NEGATIVE NEGATIVE Final    Comment: (NOTE) SARS-CoV-2 target nucleic acids are NOT DETECTED.  The SARS-CoV-2 RNA is generally detectable in upper respiratory specimens during the acute phase of infection. The lowest concentration of SARS-CoV-2 viral copies this assay can detect is 138 copies/mL. A negative result does not preclude SARS-Cov-2 infection and should not be used as the sole basis for treatment or other patient management decisions. A negative result may occur with  improper specimen collection/handling, submission of specimen other than nasopharyngeal swab, presence of viral mutation(s) within the areas targeted by this assay, and inadequate number of viral copies(<138 copies/mL). A negative result must be combined with clinical observations, patient history, and epidemiological information. The expected result is Negative.  Fact Sheet for Patients:  EntrepreneurPulse.com.au  Fact Sheet for Healthcare Providers:  IncredibleEmployment.be  This test is no t yet approved or cleared by the [place] FDA and  has been authorized for detection and/or diagnosis of SARS-CoV-2 by FDA under an Emergency Use Authorization (EUA). This EUA will remain  in effect (meaning this test can be used) for the duration of the COVID-19 declaration under Section 564(b)(1) of the Act, 21 U.S.C.section 360bbb-3(b)(1), unless the authorization is terminated  or revoked sooner.       Influenza A by PCR NEGATIVE  NEGATIVE Final   Influenza B by PCR NEGATIVE NEGATIVE Final    Comment: (NOTE) The Xpert Xpress SARS-CoV-2/FLU/RSV plus assay is intended as an aid in the diagnosis of influenza from Nasopharyngeal swab specimens and should not be used as a sole basis for treatment. Nasal washings and aspirates are unacceptable for Xpert Xpress  SARS-CoV-2/FLU/RSV testing.  Fact Sheet for Patients: EntrepreneurPulse.com.au  Fact Sheet for Healthcare Providers: IncredibleEmployment.be  This test is not yet approved or cleared by the [place] FDA and has been authorized for detection and/or diagnosis of SARS-CoV-2 by FDA under an Emergency Use Authorization (EUA). This EUA will remain in effect (meaning this test can be used) for the duration of the COVID-19 declaration under Section 564(b)(1) of the Act, 21 U.S.C. section 360bbb-3(b)(1), unless the authorization is terminated or revoked.  Performed at [facility], [place] [address] [place], [place] [address]   Surgical pcr screen     Status: None   Collection Time: 11/08/20 12:13 AM   Specimen: Nasal Mucosa; Nasal Swab  Result Value Ref Range Status   MRSA, PCR NEGATIVE NEGATIVE Final   Staphylococcus aureus NEGATIVE NEGATIVE Final    Comment: (NOTE) The Xpert SA Assay (FDA approved for NASAL specimens in patients 75 years of age and older), is one component of a comprehensive surveillance program. It is not intended to diagnose infection nor to guide or monitor treatment. Performed at [facility], [place] [address] [place], [place] [address]      Labs: BNP (last 3 results) Recent Labs    11/07/20 1600  BNP 962.9*   Basic Metabolic Panel: Recent Labs  Lab 11/07/20 1200 11/08/20 0319 11/09/20 1235 11/11/20 0826  NA 140 139 137 139  K 4.2 5.0 4.5 3.8  CL 101 102 98 97*  CO2 26 23 26 28   GLUCOSE 134* 103* 98 147*  BUN 49* 59* 39* 44*  CREATININE 10.06* 10.97* 7.83* 8.21*   CALCIUM 9.8 9.5 9.2 9.4  PHOS  --  4.5 5.4* 3.5   Liver Function Tests: Recent Labs  Lab 11/07/20 1200 11/09/20 1235 11/11/20 0826  AST 21  --   --   ALT 12  --   --   ALKPHOS 83  --   --   BILITOT 0.6  --   --   PROT 6.0*  --   --   ALBUMIN 2.9* 2.2* 2.1*   No results for input(s): LIPASE, AMYLASE in the last 168 hours. No results for input(s): AMMONIA in the last 168 hours. CBC: Recent Labs  Lab 11/07/20 1200 11/08/20 0319 11/09/20 0447 11/10/20 0119 11/11/20 0125  WBC 10.0 8.9 9.6 11.1* 10.1  NEUTROABS 6.9  --   --   --   --   HGB 10.3* 9.1* 8.1* 8.2* 7.8*  HCT 30.8* 26.5* 24.7* 23.8* 23.9*  MCV 105.5* 103.9* 101.2* 101.3* 101.7*  PLT 163 157 168 191 199   Cardiac Enzymes: No results for input(s): CKTOTAL, CKMB, CKMBINDEX, TROPONINI in the last 168 hours. BNP: Invalid input(s): POCBNP CBG: Recent Labs  Lab 11/12/20 1117 11/12/20 1629 11/12/20 1936 11/13/20 0645 11/13/20 1205  GLUCAP 143* 215* 133* 121* 207*   D-Dimer No results for input(s): DDIMER in the last 72 hours. Hgb A1c No results for input(s): HGBA1C in the last 72 hours. Lipid Profile No results for input(s): CHOL, HDL, LDLCALC, TRIG, CHOLHDL, LDLDIRECT in the last 72 hours. Thyroid function studies No results for input(s): TSH, T4TOTAL, T3FREE, THYROIDAB in the last 72 hours.  Invalid input(s): FREET3 Anemia work up No results for input(s): VITAMINB12, FOLATE, FERRITIN, TIBC, IRON, RETICCTPCT in the last 72 hours. Urinalysis    Component Value Date/Time   COLORURINE YELLOW 07/21/2018 1044   APPEARANCEUR CLOUDY (A) 07/21/2018 1044   LABSPEC 1.009 07/21/2018 1044   PHURINE 9.0 (H) 07/21/2018  [place] 07/21/2018 [place] 07/21/2018 1044   BILIRUBINUR NEGATIVE 07/21/2018 1044   BILIRUBINUR neg 02/14/2015 [place] 07/21/2018 1044   PROTEINUR 100 (A) 07/21/2018 1044   UROBILINOGEN 0.2 02/20/2015 0944   NITRITE NEGATIVE 07/21/2018 1044    LEUKOCYTESUR SMALL (A) 07/21/2018 1044   Sepsis Labs Invalid input(s): PROCALCITONIN,  WBC,  LACTICIDVEN Microbiology Recent Results (from the past 240 hour(s))  Resp Panel by RT-PCR (Flu A&B, Covid) Nasopharyngeal Swab     Status: None   Collection Time: 11/07/20  1:38 PM   Specimen: Nasopharyngeal Swab; Nasopharyngeal(NP) swabs in vial transport medium  Result Value Ref Range Status   SARS Coronavirus 2 by RT PCR NEGATIVE NEGATIVE Final    Comment: (NOTE) SARS-CoV-2 target nucleic acids are NOT DETECTED.  The SARS-CoV-2 RNA is generally detectable in upper respiratory specimens during the acute phase of infection. The lowest concentration of SARS-CoV-2 viral copies this assay can detect is 138 copies/mL. A negative result does not preclude SARS-Cov-2 infection and should not be used as the sole basis for treatment or other patient management decisions. A negative result may occur with  improper specimen collection/handling, submission of specimen other than nasopharyngeal swab, presence of viral mutation(s) within the areas targeted by this assay, and inadequate number of viral copies(<138 copies/mL). A negative result must be combined with clinical observations, patient history, and epidemiological information. The expected result is Negative.  Fact Sheet for Patients:  EntrepreneurPulse.com.au  Fact Sheet for Healthcare Providers:  IncredibleEmployment.be  This test is no t yet approved or cleared by the [place] FDA and  has been authorized for detection and/or diagnosis of SARS-CoV-2 by FDA under an Emergency Use Authorization (EUA). This EUA will remain  in effect (meaning this test can be used) for the duration of the COVID-19 declaration under Section 564(b)(1) of the Act, 21 U.S.C.section 360bbb-3(b)(1), unless the authorization is terminated  or revoked sooner.       Influenza A by PCR NEGATIVE NEGATIVE Final   Influenza B  by PCR NEGATIVE NEGATIVE Final    Comment: (NOTE) The Xpert Xpress SARS-CoV-2/FLU/RSV plus assay is intended as an aid in the diagnosis of influenza from Nasopharyngeal swab specimens and should not be used as a sole basis for treatment. Nasal washings and aspirates are unacceptable for Xpert Xpress SARS-CoV-2/FLU/RSV testing.  Fact Sheet for Patients: EntrepreneurPulse.com.au  Fact Sheet for Healthcare Providers: IncredibleEmployment.be  This test is not yet approved or cleared by the [place] FDA and has been authorized for detection and/or diagnosis of SARS-CoV-2 by FDA under an Emergency Use Authorization (EUA). This EUA will remain in effect (meaning this test can be used) for the duration of the COVID-19 declaration under Section 564(b)(1) of the Act, 21 U.S.C. section 360bbb-3(b)(1), unless the authorization is terminated or revoked.  Performed at [facility], [place] [address] [place], [place] [address]   Surgical pcr screen     Status: None   Collection Time: 11/08/20 12:13 AM   Specimen: Nasal Mucosa; Nasal Swab  Result Value Ref Range Status   MRSA, PCR NEGATIVE NEGATIVE Final   Staphylococcus aureus NEGATIVE NEGATIVE Final    Comment: (NOTE) The Xpert SA Assay (FDA approved for NASAL specimens in patients 21 years of age and older), is one component of a comprehensive surveillance program. It is not intended to diagnose infection nor to guide or monitor treatment. Performed at [facility], [place] [address],  [place], [place] [address]      Time coordinating discharge:  35 minutes  SIGNED:   Barb Merino, MD  [facility] 11/13/2020, 1:43 PM

## 2020-11-13 NOTE — Plan of Care (Signed)

## 2020-11-14 DIAGNOSIS — S72144A Nondisplaced intertrochanteric fracture of right femur, initial encounter for closed fracture: Secondary | ICD-10-CM | POA: Diagnosis not present

## 2020-11-14 LAB — GLUCOSE, CAPILLARY
Glucose-Capillary: 115 mg/dL — ABNORMAL HIGH (ref 70–99)
Glucose-Capillary: 167 mg/dL — ABNORMAL HIGH (ref 70–99)
Glucose-Capillary: 97 mg/dL (ref 70–99)
Glucose-Capillary: 139 mg/dL — ABNORMAL HIGH (ref 70–99)

## 2020-11-14 NOTE — Progress Notes (Signed)
PROGRESS NOTE    [name]  OIZ:[id number] DOB: [birth year] DOA: 11/07/2020 PCP: Elby Showers, MD    Brief Narrative:  80 year old gentleman with history of ESRD on hemodialysis Monday Wednesday Friday, dementia, hypertension, insulin-dependent diabetes, diabetic neuropathy, hypothyroidism presented to the ER with fall at home 2 days ago, unable to get up by himself.  Found to have right intertrochanteric femur fracture.  Assessment & Plan:   Principal Problem:   Closed nondisplaced intertrochanteric fracture of right femur (HCC) Active Problems:   Hyperlipidemia   Essential hypertension   Hypothyroidism   BPH (benign prostatic hyperplasia)   End stage renal disease (HCC)   OSA (obstructive sleep apnea)   Diabetes mellitus type 2 in nonobese (HCC)   Anemia   Pre-operative clearance  Closed nondisplaced intertrochanteric fracture of the right femur, traumatic: Status post ORIF 2/15 Weightbearing as tolerated DVT prophylaxis with heparin subcu, aspirin on discharge Pain control with Vicodin along with laxatives. Refer to skilled nursing facility rehab as per PT OT recommendation.  ESRD on hemodialysis: Getting dialysis on his schedule.  Anemia of chronic disease: Hemoglobin is stable.  Replaced with ESA with hemodialysis. We will recheck hemoglobin and renal functions tomorrow morning.  Hyperlipidemia: Continue Zocor.  Essential hypertension: As needed hydralazine.  Sinus tachycardia: Asymptomatic episodic sinus tachycardia, started on low-dose metoprolol with improvement.  Type 2 diabetes: Fairly stable.  On sliding scale.  A1c 4.9.   DVT prophylaxis: heparin injection 5,000 Units Start: 11/09/20 0600 SCDs Start: 11/08/20 1229 SCDs Start: 11/07/20 1442   Code Status: Partial Family Communication: Patient's wife on the phone 2/17. Disposition Plan: Status is: Inpatient  Remains inpatient appropriate because:Unsafe d/c plan   Dispo: The patient is from:  Home              Anticipated d/c is to: SNF              Anticipated d/c date is: 1 day              Patient is currently stable to discharge to skilled level of care.   Difficult to place patient No   Patient is medically stable to transfer to a skilled level of care.      Consultants:   Orthopedics  Nephrology  Procedures:   Right hip ORIF 2/15  Antimicrobials:   None, received perioperative antibiotics   Subjective: Seen and examined.  No overnight events.  Denies any complaints.  Objective: Vitals:   11/13/20 1126 11/13/20 1208 11/13/20 2000 11/14/20 0814  BP:  (!) 101/40 (!) 147/53 (!) 130/49  Pulse:  69 77 68  Resp:  18    Temp:  98.7 F (37.1 C) 97.9 F (36.6 C) 98.4 F (36.9 C)  TempSrc:   Oral Oral  SpO2:  99% 100% 96%  Weight: 73.6 kg     Height: 5\' 10"  (1.778 m)       Intake/Output Summary (Last 24 hours) at 11/14/2020 1132 Last data filed at 11/13/2020 1900 Gross per 24 hour  Intake 120 ml  Output --  Net 120 ml   Filed Weights   11/11/20 0738 11/11/20 1047 11/13/20 1126  Weight: 75.1 kg 73.6 kg 73.6 kg    Examination:  General: Pleasantly confused gentleman.  Not in any distress.  Looks comfortable and able to have conversation. Cardiovascular: S1-S2 normal. Respiratory: Bilateral clear. Gastrointestinal: Soft and nontender.  Bowel sounds present. Ext: Right thigh incisions clean and dry.  AV fistula is nontender with thrill. Neuro:  Alert oriented x1.  Mood is normal.     Data Reviewed: I have personally reviewed following labs and imaging studies  CBC: Recent Labs  Lab 11/07/20 1200 11/08/20 0319 11/09/20 0447 11/10/20 0119 11/11/20 0125  WBC 10.0 8.9 9.6 11.1* 10.1  NEUTROABS 6.9  --   --   --   --   HGB 10.3* 9.1* 8.1* 8.2* 7.8*  HCT 30.8* 26.5* 24.7* 23.8* 23.9*  MCV 105.5* 103.9* 101.2* 101.3* 101.7*  PLT 163 157 168 191 540   Basic Metabolic Panel: Recent Labs  Lab 11/07/20 1200 11/08/20 0319 11/09/20 1235  11/11/20 0826  NA 140 139 137 139  K 4.2 5.0 4.5 3.8  CL 101 102 98 97*  CO2 26 23 26 28   GLUCOSE 134* 103* 98 147*  BUN 49* 59* 39* 44*  CREATININE 10.06* 10.97* 7.83* 8.21*  CALCIUM 9.8 9.5 9.2 9.4  PHOS  --  4.5 5.4* 3.5   GFR: Estimated Creatinine Clearance: 7.5 mL/min (A) (by C-G formula based on SCr of 8.21 mg/dL (H)). Liver Function Tests: Recent Labs  Lab 11/07/20 1200 11/09/20 1235 11/11/20 0826  AST 21  --   --   ALT 12  --   --   ALKPHOS 83  --   --   BILITOT 0.6  --   --   PROT 6.0*  --   --   ALBUMIN 2.9* 2.2* 2.1*   No results for input(s): LIPASE, AMYLASE in the last 168 hours. No results for input(s): AMMONIA in the last 168 hours. Coagulation Profile: No results for input(s): INR, PROTIME in the last 168 hours. Cardiac Enzymes: No results for input(s): CKTOTAL, CKMB, CKMBINDEX, TROPONINI in the last 168 hours. BNP (last 3 results) No results for input(s): PROBNP in the last 8760 hours. HbA1C: No results for input(s): HGBA1C in the last 72 hours. CBG: Recent Labs  Lab 11/13/20 0645 11/13/20 1205 11/13/20 1559 11/13/20 2056 11/14/20 0948  GLUCAP 121* 207* 114* 139* 115*   Lipid Profile: No results for input(s): CHOL, HDL, LDLCALC, TRIG, CHOLHDL, LDLDIRECT in the last 72 hours. Thyroid Function Tests: No results for input(s): TSH, T4TOTAL, FREET4, T3FREE, THYROIDAB in the last 72 hours. Anemia Panel: No results for input(s): VITAMINB12, FOLATE, FERRITIN, TIBC, IRON, RETICCTPCT in the last 72 hours. Sepsis Labs: No results for input(s): PROCALCITON, LATICACIDVEN in the last 168 hours.  Recent Results (from the past 240 hour(s))  Resp Panel by RT-PCR (Flu A&B, Covid) Nasopharyngeal Swab     Status: None   Collection Time: 11/07/20  1:38 PM   Specimen: Nasopharyngeal Swab; Nasopharyngeal(NP) swabs in vial transport medium  Result Value Ref Range Status   SARS Coronavirus 2 by RT PCR NEGATIVE NEGATIVE Final    Comment: (NOTE) SARS-CoV-2 target  nucleic acids are NOT DETECTED.  The SARS-CoV-2 RNA is generally detectable in upper respiratory specimens during the acute phase of infection. The lowest concentration of SARS-CoV-2 viral copies this assay can detect is 138 copies/mL. A negative result does not preclude SARS-Cov-2 infection and should not be used as the sole basis for treatment or other patient management decisions. A negative result may occur with  improper specimen collection/handling, submission of specimen other than nasopharyngeal swab, presence of viral mutation(s) within the areas targeted by this assay, and inadequate number of viral copies(<138 copies/mL). A negative result must be combined with clinical observations, patient history, and epidemiological information. The expected result is Negative.  Fact Sheet for Patients:  EntrepreneurPulse.com.au  Fact Sheet  for Healthcare Providers:  IncredibleEmployment.be  This test is no t yet approved or cleared by the [place] and  has been authorized for detection and/or diagnosis of SARS-CoV-2 by FDA under an Emergency Use Authorization (EUA). This EUA will remain  in effect (meaning this test can be used) for the duration of the COVID-19 declaration under Section 564(b)(1) of the Act, 21 U.S.C.section 360bbb-3(b)(1), unless the authorization is terminated  or revoked sooner.       Influenza A by PCR NEGATIVE NEGATIVE Final   Influenza B by PCR NEGATIVE NEGATIVE Final    Comment: (NOTE) The Xpert Xpress SARS-CoV-2/FLU/RSV plus assay is intended as an aid in the diagnosis of influenza from Nasopharyngeal swab specimens and should not be used as a sole basis for treatment. Nasal washings and aspirates are unacceptable for Xpert Xpress SARS-CoV-2/FLU/RSV testing.  Fact Sheet for Patients: EntrepreneurPulse.com.au  Fact Sheet for Healthcare  Providers: IncredibleEmployment.be  This test is not yet approved or cleared by the [place] FDA and has been authorized for detection and/or diagnosis of SARS-CoV-2 by FDA under an Emergency Use Authorization (EUA). This EUA will remain in effect (meaning this test can be used) for the duration of the COVID-19 declaration under Section 564(b)(1) of the Act, 21 U.S.C. section 360bbb-3(b)(1), unless the authorization is terminated or revoked.  Performed at [facility], [place] [address], [place], [place] [address]   Surgical pcr screen     Status: None   Collection Time: 11/08/20 12:13 AM   Specimen: Nasal Mucosa; Nasal Swab  Result Value Ref Range Status   MRSA, PCR NEGATIVE NEGATIVE Final   Staphylococcus aureus NEGATIVE NEGATIVE Final    Comment: (NOTE) The Xpert SA Assay (FDA approved for NASAL specimens in patients 27 years of age and older), is one component of a comprehensive surveillance program. It is not intended to diagnose infection nor to guide or monitor treatment. Performed at [facility], [place] [address], [place], [place] [address]   SARS CORONAVIRUS 2 (TAT 6-24 HRS) Nasopharyngeal Nasopharyngeal Swab     Status: None   Collection Time: 11/13/20 10:53 AM   Specimen: Nasopharyngeal Swab  Result Value Ref Range Status   SARS Coronavirus 2 NEGATIVE NEGATIVE Final    Comment: (NOTE) SARS-CoV-2 target nucleic acids are NOT DETECTED.  The SARS-CoV-2 RNA is generally detectable in upper and lower respiratory specimens during the acute phase of infection. Negative results do not preclude SARS-CoV-2 infection, do not rule out co-infections with other pathogens, and should not be used as the sole basis for treatment or other patient management decisions. Negative results must be combined with clinical observations, patient history, and epidemiological information. The expected result is Negative.  Fact Sheet for  Patients: SugarRoll.be  Fact Sheet for Healthcare Providers: https://www.woods-mathews.com/  This test is not yet approved or cleared by the [place] FDA and  has been authorized for detection and/or diagnosis of SARS-CoV-2 by FDA under an Emergency Use Authorization (EUA). This EUA will remain  in effect (meaning this test can be used) for the duration of the COVID-19 declaration under Se ction 564(b)(1) of the Act, 21 U.S.C. section 360bbb-3(b)(1), unless the authorization is terminated or revoked sooner.  Performed at [facility], [place] [address], [place], [place] [address]          Radiology Studies: No results found.      Scheduled Meds: . allopurinol  100 mg Oral Daily  . Chlorhexidine Gluconate Cloth  6 each Topical Q0600  . Chlorhexidine Gluconate Cloth  6 each Topical Q0600  . darbepoetin (ARANESP) injection - DIALYSIS  100 mcg Intravenous Q Fri-HD  . docusate sodium  100 mg Oral BID  . donepezil  10 mg Oral QHS  . feeding supplement (NEPRO CARB STEADY)  237 mL Oral TID WC  . fluticasone  1 spray Each Nare Daily  . gabapentin  100 mg Oral QHS  . heparin injection (subcutaneous)  5,000 Units Subcutaneous Q8H  . insulin aspart  0-6 Units Subcutaneous TID WC  . levothyroxine  50 mcg Oral QAC breakfast  . linagliptin  5 mg Oral Daily  . loratadine  10 mg Oral Daily  . memantine  10 mg Oral BID  . metoprolol tartrate  12.5 mg Oral BID  . midodrine  10 mg Oral Q M,W,F-HD  . multivitamin  1 tablet Oral QHS  . pantoprazole  40 mg Oral Daily  . sevelamer carbonate  2,400 mg Oral 3 times per day on Sun Tue Thu Sat  . sevelamer carbonate  2,400 mg Oral 2 times per day on Mon Wed Fri  . simvastatin  20 mg Oral QHS   Continuous Infusions:   LOS: 7 days    Time spent: 25 minutes    Barb Merino, MD [facility] Pager [phone]

## 2020-11-14 NOTE — Plan of Care (Signed)

## 2020-11-14 NOTE — TOC Progression Note (Signed)
Transition of Care [facility]) - Progression Note    Patient Details  Name: [name] MRN: [id number] Date of Birth: Aug 02, 1941  Transition [facility]) CM/SW [place], [place] Phone Number: 11/14/2020, 11:25 AM  Clinical Narrative:    CSW spoke with [facility] Admission.  They will not be able to accept people due to [facility] being at their cap for dialysis pt.  CSW will continue to search for SNF placement. TOC Team will continue to assist with SNF placement.   Expected Discharge Plan: [place] (vs SNF) Barriers to Discharge: Continued Medical Work up  Expected Discharge Plan and Services Expected Discharge Plan: [place] (vs SNF)   Discharge Planning Services: CM Consult     Expected Discharge Date: 11/14/20                                     Social Determinants of Health (SDOH) Interventions    Readmission Risk Interventions No flowsheet data found.

## 2020-11-14 NOTE — TOC Progression Note (Signed)
Transition of Care [facility]) - Progression Note    Patient Details  Name: [name] MRN: [id number] Date of Birth: 29-Nov-1940  Transition of Care [facility]) CM/SW [place], [place] Phone Number: 11/14/2020, 4:41 PM  Clinical Narrative:    CSW spoke with pt's spouse by phone.  Pt's spouse stated that she is not satisfied with pt's care and the SNF choices given to her.  Pt's spouse would like to seek possible SNF placement at the [place].  CSW explained that the pt's primary insurance is Medicare and T[employer]are is secondary.  CSW followed update NCM [facility].  NCM Levada Dy will follow up with pt's spouse. TOC will continue to assist with disposition planning.    Expected Discharge Plan: [place] (vs SNF) Barriers to Discharge: Continued Medical Work up  Expected Discharge Plan and Services Expected Discharge Plan: [place] (vs SNF)   Discharge Planning Services: CM Consult     Expected Discharge Date: 11/14/20                                     Social Determinants of Health (SDOH) Interventions    Readmission Risk Interventions No flowsheet data found.

## 2020-11-14 NOTE — Progress Notes (Signed)
[facility] Progress Note   Subjective:     [name] was seen and examined today at bedside. Patient is s/p R intertrochanteric femur fracture. He reports mild R hip discomfort, but otherwise, has no issues or complaints. He is awaiting SNF placement.   Objective Vitals:   11/13/20 1126 11/13/20 1208 11/13/20 2000 11/14/20 0814  BP:  (!) 101/40 (!) 147/53 (!) 130/49  Pulse:  69 77 68  Resp:  18    Temp:  98.7 F (37.1 C) 97.9 F (36.6 C) 98.4 F (36.9 C)  TempSrc:   Oral Oral  SpO2:  99% 100% 96%  Weight: 73.6 kg     Height: 5\' 10"  (1.778 m)      Physical Exam General: Appears comfortable; No evidence of acute respiratory distress Heart: Normal S1 and S2; No murmurs, gallops, friction rub Lungs: Clear throughout w/o evidence of rales, wheezing, and rhonchi Abdomen: Large, soft, non-tender, non-distended Extremities: No edema of bilateral upper extremities, hips, and lower extremities. Dialysis Access: R AVF (+) Bruit/Thrill  Filed Weights   11/11/20 0738 11/11/20 1047 11/13/20 1126  Weight: 75.1 kg 73.6 kg 73.6 kg    Intake/Output Summary (Last 24 hours) at 11/14/2020 1222 Last data filed at 11/13/2020 1900 Gross per 24 hour  Intake 120 ml  Output -  Net 120 ml    Additional Objective Labs: Basic Metabolic Panel: Recent Labs  Lab 11/08/20 0319 11/09/20 1235 11/11/20 0826  NA 139 137 139  K 5.0 4.5 3.8  CL 102 98 97*  CO2 23 26 28   GLUCOSE 103* 98 147*  BUN 59* 39* 44*  CREATININE 10.97* 7.83* 8.21*  CALCIUM 9.5 9.2 9.4  PHOS 4.5 5.4* 3.5   Liver Function Tests: Recent Labs  Lab 11/09/20 1235 11/11/20 0826  ALBUMIN 2.2* 2.1*   CBC: Recent Labs  Lab 11/08/20 0319 11/09/20 0447 11/10/20 0119 11/11/20 0125  WBC 8.9 9.6 11.1* 10.1  HGB 9.1* 8.1* 8.2* 7.8*  HCT 26.5* 24.7* 23.8* 23.9*  MCV 103.9* 101.2* 101.3* 101.7*  PLT 157 168 191 199   Blood Culture    Component Value Date/Time   SDES WOUND RIGHT ARTERIAL FISTULA  09/01/2019 1305   SPECREQUEST NONE 09/01/2019 1305   CULT  09/01/2019 1305    FEW STAPHYLOCOCCUS AUREUS NO ANAEROBES ISOLATED Performed at [facility], [place] [address], [place], [place] [address]    REPTSTATUS 09/06/2019 FINAL 09/01/2019 1305    CBG: Recent Labs  Lab 11/13/20 0645 11/13/20 1205 11/13/20 1559 11/13/20 2056 11/14/20 0948  GLUCAP 121* 207* 114* 139* 115*   Iron Studies: No results for input(s): IRON, TIBC, TRANSFERRIN, FERRITIN in the last 72 hours. Lab Results  Component Value Date   INR 0.97 07/22/2018   INR 1.01 06/05/2016    Medications:  . allopurinol  100 mg Oral Daily  . Chlorhexidine Gluconate Cloth  6 each Topical Q0600  . Chlorhexidine Gluconate Cloth  6 each Topical Q0600  . darbepoetin (ARANESP) injection - DIALYSIS  100 mcg Intravenous Q Fri-HD  . docusate sodium  100 mg Oral BID  . donepezil  10 mg Oral QHS  . feeding supplement (NEPRO CARB STEADY)  237 mL Oral TID WC  . fluticasone  1 spray Each Nare Daily  . gabapentin  100 mg Oral QHS  . heparin injection (subcutaneous)  5,000 Units Subcutaneous Q8H  . insulin aspart  0-6 Units Subcutaneous TID WC  . levothyroxine  50 mcg Oral QAC breakfast  . linagliptin  5 mg Oral Daily  . loratadine  10 mg Oral Daily  . memantine  10 mg Oral BID  . metoprolol tartrate  12.5 mg Oral BID  . midodrine  10 mg Oral Q M,W,F-HD  . multivitamin  1 tablet Oral QHS  . pantoprazole  40 mg Oral Daily  . sevelamer carbonate  2,400 mg Oral 3 times per day on Sun Tue Thu Sat  . sevelamer carbonate  2,400 mg Oral 2 times per day on Mon Wed Fri  . simvastatin  20 mg Oral QHS    Dialysis Orders: 3hrs, BFR400, DFR500, EDW 74kg,3K/2.25Ca Access:RU AVF Heparinnone Mircera70mcg q2wks - last 170mcg on 10/28/20  Assessment/Plan: 1. R femur Frx -s/p cephalomedullary nailing of R intertrochanteric hip Frx on 2/15 by Dr. Lucia Gaskins.  2. ESRD- On HD MWF. Will receive HD  today. 3. Hypertension/volume- Blood pressurein goal.Continue home meds.On midodrine with HD.Does not appear volume overloaded. UF as tolerated.  4. Anemiaof CKD- Hgb7.8 post surgery. Received Aranesp 100 on 2/18. Follow  5. Secondary Hyperparathyroidism -Corrected Ca high. Not on VDRA, use low Ca bath.   Phos in goal, continue Renvela.  6. Nutrition- Currently on carb modified diet w/fluid restrictions.Protein supplements. Renal vitamin.  7. DM - per primary 8. Hypothyroidism- on synthroid.  9. DOE - eval by cardiology. Echo with EF 50-55%, G1DD. Plan for OP follow up. 10. Dispo: SNF vs. CIR   Tobie Poet, NP [facility] 11/14/2020,12:22 PM  LOS: 7 days

## 2020-11-14 NOTE — Plan of Care (Signed)
  Problem: Clinical Measurements: Goal: Will remain free from infection Outcome: Progressing Goal: Cardiovascular complication will be avoided Outcome: Progressing   Problem: Activity: Goal: Risk for activity intolerance will decrease Outcome: Progressing   

## 2020-11-15 ENCOUNTER — Other Ambulatory Visit: Payer: Medicare Other

## 2020-11-15 DIAGNOSIS — S72144A Nondisplaced intertrochanteric fracture of right femur, initial encounter for closed fracture: Secondary | ICD-10-CM | POA: Diagnosis not present

## 2020-11-15 LAB — GLUCOSE, CAPILLARY
Glucose-Capillary: 120 mg/dL — ABNORMAL HIGH (ref 70–99)
Glucose-Capillary: 120 mg/dL — ABNORMAL HIGH (ref 70–99)
Glucose-Capillary: 114 mg/dL — ABNORMAL HIGH (ref 70–99)
Glucose-Capillary: 139 mg/dL — ABNORMAL HIGH (ref 70–99)
Glucose-Capillary: 127 mg/dL — ABNORMAL HIGH (ref 70–99)

## 2020-11-15 LAB — CREATININE, SERUM
Creatinine, Ser: 6.49 mg/dL — ABNORMAL HIGH (ref 0.61–1.24)
GFR, Estimated: 8 mL/min — ABNORMAL LOW (ref >60)

## 2020-11-15 MED ORDER — PENTAFLUOROPROP-TETRAFLUOROETH EX AERO: 1.0000 "application " | INHALATION_SPRAY | CUTANEOUS | Status: DC | PRN

## 2020-11-15 MED ORDER — ALTEPLASE 2 MG IJ SOLR
2.0000 mg | Freq: Once | INTRAMUSCULAR | Status: DC | PRN
  Filled 2020-11-15: qty 2

## 2020-11-15 MED ORDER — HEPARIN SODIUM (PORCINE) 1000 UNIT/ML DIALYSIS
1000.0000 [IU] | INTRAMUSCULAR | Status: DC | PRN
  Filled 2020-11-15: qty 1

## 2020-11-15 MED ORDER — LIDOCAINE HCL (PF) 1 % IJ SOLN
5.0000 mL | INTRAMUSCULAR | Status: DC | PRN
  Filled 2020-11-15 (×2): qty 5

## 2020-11-15 MED ORDER — SODIUM CHLORIDE 0.9 % IV SOLN: 100.0000 mL | INTRAVENOUS | Status: DC | PRN

## 2020-11-15 NOTE — Progress Notes (Signed)
Physical Therapy Treatment Patient Details Name: [name] MRN: [id number] DOB: [birth year] Today's Date: 11/15/2020    History of Present Illness [name] is a 80 y.o. male with medical history significant of ESRD on HD, HTN, IDDM, diabetic neuropathy, hypothyroidism, gout, chronic ambulation dysfunction, presented with mechanical fall sustaining a R intertrochanteric  fx who underwent and R IM Nail on 2/15.    PT Comments    Pt received in supine, agreeable to therapy session with moderate encouragement and with fair participation and tolerance for mobility. Pt continues to need greatly increased time/encouragement to initiate all mobility tasks and initially agreeable to sit EOB, however once pt rolled to R side, pt refusing further mobility and returned to supine. Pt rolled to L/R sides for repositioning/pressure offloading and RN in room to change sacral dressing during session. Pt performed BLE A/AAROM therapeutic exercises with good tolerance but needs multimodal cues and needs supine/seated HEP handout brought to room next session for reinforcement. Pt continues to benefit from PT services to progress toward functional mobility goals. Continue to recommend SNF.  Follow Up Recommendations  SNF;Supervision/Assistance - 24 hour     Equipment Recommendations  Rolling walker with 5" wheels    Recommendations for Other Services       Precautions / Restrictions Precautions Precautions: Fall Precaution Comments: watch HR Restrictions Weight Bearing Restrictions: Yes RLE Weight Bearing: Weight bearing as tolerated    Mobility  Bed Mobility Overal bed mobility: Needs Assistance Bed Mobility: Rolling Rolling: Mod assist;Max assist;+2 for safety/equipment   Supine to sit: Total assist;+2 for physical assistance     General bed mobility comments: max encouragement to sit EOB, increased time and effort to complete, cues to use rails. pt performed partial transfer to EOB to  R side however once RLE over EOB pt pulling trunk back to supine and refusing to raise trunk fully, verbalized fear of falling and unwilling to try more; pt rolled to L/R sides needing increased assist to roll to R and multimodal cues    Transfers                 General transfer comment: pt declined at this time  Ambulation/Gait                 Stairs             Wheelchair Mobility    Modified Rankin (Stroke Patients Only)       Balance Overall balance assessment: Needs assistance   Sitting balance-Leahy Scale: Zero Sitting balance - Comments: unable to reach full upright, pt too anxious to attempt and refusing                                    Cognition Arousal/Alertness: Awake/alert Behavior During Therapy: WFL for tasks assessed/performed Overall Cognitive Status: Impaired/Different from baseline Area of Impairment: Attention;Memory;Safety/judgement;Awareness;Problem solving                 Orientation Level: Time;Disoriented to   Memory: Decreased short-term memory Following Commands: Follows one step commands inconsistently;Follows one step commands with increased time Safety/Judgement: Decreased awareness of safety;Decreased awareness of deficits   Problem Solving: Slow processing;Decreased initiation;Difficulty sequencing;Requires verbal cues;Requires tactile cues General Comments: Pt verbalized understanding of importance of mobility, but declining multiple attempts, internally distracted by pain. Agreeable to EOB but then unable to tolerate      Exercises General Exercises - Lower  Extremity Ankle Circles/Pumps: AROM;Both;20 reps;Supine Short Arc Quad: AROM;Right;10 reps;Supine Heel Slides: AAROM;AROM;Both;10 reps;Supine (AA on RLE, AROM on LLE) Hip ABduction/ADduction: AAROM;AROM;Both;10 reps;Supine (AROM on LLE, AA on RLE)    General Comments General comments (skin integrity, edema, etc.): RN changed sacral  dressing during session and applied barrier cream to perineal/area, pillow behind R back/hip to offload and R heel floated      Pertinent Vitals/Pain Pain Assessment: Faces Faces Pain Scale: Hurts even more Pain Location: R hip with mobility Pain Descriptors / Indicators: Grimacing;Guarding;Moaning Pain Intervention(s): Monitored during session;Premedicated before session;Repositioned;Limited activity within patient's tolerance    Home Living                      Prior Function            PT Goals (current goals can now be found in the care plan section) Acute Rehab PT Goals Patient Stated Goal: less pain PT Goal Formulation: With patient Time For Goal Achievement: 11/23/20 Potential to Achieve Goals: Good Progress towards PT goals: Not progressing toward goals - comment (pt declining OOB "maybe tomorrow")    Frequency    Min 3X/week      PT Plan Current plan remains appropriate    Co-evaluation              AM-PAC PT "6 Clicks" Mobility   Outcome Measure  Help needed turning from your back to your side while in a flat bed without using bedrails?: A Lot Help needed moving from lying on your back to sitting on the side of a flat bed without using bedrails?: A Lot Help needed moving to and from a bed to a chair (including a wheelchair)?: Total Help needed standing up from a chair using your arms (e.g., wheelchair or bedside chair)?: Total Help needed to walk in hospital room?: Total Help needed climbing 3-5 steps with a railing? : Total 6 Click Score: 8    End of Session   Activity Tolerance: Patient limited by pain Patient left: in bed;with call bell/phone within reach;with nursing/sitter in room (RN in room to give meds) Nurse Communication: Mobility status PT Visit Diagnosis: Unsteadiness on feet (R26.81);Other abnormalities of gait and mobility (R26.89);Muscle weakness (generalized) (M62.81);Difficulty in walking, not elsewhere classified (R26.2)      Time: 1716-1750 PT Time Calculation (min) (ACUTE ONLY): 34 min  Charges:  $Therapeutic Exercise: 8-22 mins $Therapeutic Activity: 8-22 mins                     Anyelo Mccue P., PTA Acute Rehabilitation Services Pager: [phone] Office: [place] 11/15/2020, 6:04 PM

## 2020-11-15 NOTE — Progress Notes (Signed)
02/21 1930 Pt refused SCDs. Pt educated. RN will continue to assess pt.

## 2020-11-15 NOTE — TOC Progression Note (Addendum)
Transition of Care [facility]) - Progression Note    Patient Details  Name: MANNIE OHLIN MRN: [id number] Date of Birth: 1940-11-19  Transition of Care [facility]) CM/SW [place], [place] Phone Number: 11/15/2020, 2:05 PM  Clinical Narrative:    CSW spoke with pt's spouse.  Pt's spouse would like for CSW to reach out to the following SNF's for possible placement: [facility], [facility], [facility] and [facility].  CSW has left vm messages for all SNF's mention in note.  CSW waiting on return phone calls from the facility.  CSW will follow up with pt's spouse.  TOC will continue to assist with disposition planning.  Update:  [facility]: can take pt tomorrow checking on transportation for dialysis.  Information was given to pt's spouse. [facility]: spoke with Alyse Low [phone].  Alyse Low will review clinicals and return call.  Expected Discharge Plan: [place] (vs SNF) Barriers to Discharge: Continued Medical Work up  Expected Discharge Plan and Services Expected Discharge Plan: [place] (vs SNF)   Discharge Planning Services: CM Consult     Expected Discharge Date: 11/14/20                                     Social Determinants of Health (SDOH) Interventions    Readmission Risk Interventions No flowsheet data found.

## 2020-11-15 NOTE — Progress Notes (Signed)
02/21 2013 Handoff given to dialysis nurse Obas Henorck,RN.  02/21 2035 Pt transported to dialysis. Pt denies pain. Pt stable.

## 2020-11-15 NOTE — Progress Notes (Signed)
PROGRESS NOTE    [name]  WVP:[id number] DOB: October 01, 1940 DOA: 11/07/2020 PCP: Elby Showers, MD    Brief Narrative:  80 year old gentleman with history of ESRD on hemodialysis Monday, Wednesday, Friday, dementia, hypertension, insulin-dependent diabetes, diabetic neuropathy, hypothyroidism presented to the ER with fall at home 2 days ago, unable to get up by himself.  Found to have right intertrochanteric femur fracture.  Assessment & Plan:   Principal Problem:   Closed nondisplaced intertrochanteric fracture of right femur (HCC) Active Problems:   Hyperlipidemia   Essential hypertension   Hypothyroidism   BPH (benign prostatic hyperplasia)   End stage renal disease (HCC)   OSA (obstructive sleep apnea)   Diabetes mellitus type 2 in nonobese (HCC)   Anemia   Pre-operative clearance  Closed nondisplaced intertrochanteric fracture of the right femur, traumatic: Status post ORIF 2/15 Weightbearing as tolerated DVT prophylaxis with heparin subcu, aspirin on discharge. Pain control with Vicodin along with laxatives. Refer to skilled nursing facility rehab as per PT OT recommendation. Waiting for bed availability.  ESRD on hemodialysis: Getting dialysis on his schedule.  Anemia of chronic disease: Hemoglobin is stable.  Replaced with ESA with hemodialysis.  Hyperlipidemia: Continue Zocor.  Essential hypertension: As needed hydralazine.  Sinus tachycardia: Asymptomatic episodic sinus tachycardia, started on low-dose metoprolol with improvement.  Type 2 diabetes: Fairly stable.  On sliding scale.  A1c 4.9.   DVT prophylaxis: heparin injection 5,000 Units Start: 11/09/20 0600 SCDs Start: 11/08/20 1229 SCDs Start: 11/07/20 1442   Code Status: Partial Family Communication: Patient's wife on the phone 2/17. Disposition Plan: Status is: Inpatient  Remains inpatient appropriate because:Unsafe d/c plan   Dispo: The patient is from: Home              Anticipated d/c  is to: SNF              Anticipated d/c date is: 1 day              Patient is currently stable to discharge to skilled level of care.   Difficult to place patient No   Patient is medically stable to transfer to a skilled level of care.      Consultants:   Orthopedics  Nephrology  Procedures:   Right hip ORIF 2/15  Antimicrobials:   None, received perioperative antibiotics   Subjective: Patient seen and examined.  No overnight events.  He complains of pain on the right thigh.  Unfortunately he has not asked for any pain medicine and not received any last 24 hours.  No other overnight events.  Objective: Vitals:   11/15/20 0054 11/15/20 0548 11/15/20 0729 11/15/20 1159  BP: (!) 121/45 (!) 122/33 (!) 109/35 (!) 108/35  Pulse: 80 70 66 61  Resp: 15  15 18   Temp: 99 F (37.2 C) 98.4 F (36.9 C) 98.1 F (36.7 C) 98.2 F (36.8 C)  TempSrc: Oral Oral Oral   SpO2: 100% 100% 100% 96%  Weight:      Height:        Intake/Output Summary (Last 24 hours) at 11/15/2020 1431 Last data filed at 11/15/2020 0019 Gross per 24 hour  Intake -  Output 1000 ml  Net -1000 ml   Filed Weights   11/11/20 0738 11/11/20 1047 11/13/20 1126  Weight: 75.1 kg 73.6 kg 73.6 kg    Examination:  General: Comfortable.  Pleasantly confused.  Not in any distress. Cardiovascular: S1-S2 normal. Respiratory: Bilateral clear. Gastrointestinal: Soft and nontender.  Bowel sounds  present. Ext: Motor power equal. Neuro: Alert oriented x1.  Pleasant.  All extremities equally strength. Musculoskeletal: Right lateral thigh incision clean and dry. Skin: Clear. Psych: Pleasant. IV lines: None.  Has dialysis access AV fistula. Indwelling catheters: None.      Data Reviewed: I have personally reviewed following labs and imaging studies  CBC: Recent Labs  Lab 11/09/20 0447 11/10/20 0119 11/11/20 0125  WBC 9.6 11.1* 10.1  HGB 8.1* 8.2* 7.8*  HCT 24.7* 23.8* 23.9*  MCV 101.2* 101.3* 101.7*   PLT 168 191 094   Basic Metabolic Panel: Recent Labs  Lab 11/09/20 1235 11/11/20 0826 11/15/20 0411  NA 137 139  --   K 4.5 3.8  --   CL 98 97*  --   CO2 26 28  --   GLUCOSE 98 147*  --   BUN 39* 44*  --   CREATININE 7.83* 8.21* 6.49*  CALCIUM 9.2 9.4  --   PHOS 5.4* 3.5  --    GFR: Estimated Creatinine Clearance: 9.5 mL/min (A) (by C-G formula based on SCr of 6.49 mg/dL (H)). Liver Function Tests: Recent Labs  Lab 11/09/20 1235 11/11/20 0826  ALBUMIN 2.2* 2.1*   No results for input(s): LIPASE, AMYLASE in the last 168 hours. No results for input(s): AMMONIA in the last 168 hours. Coagulation Profile: No results for input(s): INR, PROTIME in the last 168 hours. Cardiac Enzymes: No results for input(s): CKTOTAL, CKMB, CKMBINDEX, TROPONINI in the last 168 hours. BNP (last 3 results) No results for input(s): PROBNP in the last 8760 hours. HbA1C: No results for input(s): HGBA1C in the last 72 hours. CBG: Recent Labs  Lab 11/14/20 1657 11/14/20 2025 11/15/20 0129 11/15/20 0727 11/15/20 1156  GLUCAP 97 139* 120* 120* 114*   Lipid Profile: No results for input(s): CHOL, HDL, LDLCALC, TRIG, CHOLHDL, LDLDIRECT in the last 72 hours. Thyroid Function Tests: No results for input(s): TSH, T4TOTAL, FREET4, T3FREE, THYROIDAB in the last 72 hours. Anemia Panel: No results for input(s): VITAMINB12, FOLATE, FERRITIN, TIBC, IRON, RETICCTPCT in the last 72 hours. Sepsis Labs: No results for input(s): PROCALCITON, LATICACIDVEN in the last 168 hours.  Recent Results (from the past 240 hour(s))  Resp Panel by RT-PCR (Flu A&B, Covid) Nasopharyngeal Swab     Status: None   Collection Time: 11/07/20  1:38 PM   Specimen: Nasopharyngeal Swab; Nasopharyngeal(NP) swabs in vial transport medium  Result Value Ref Range Status   SARS Coronavirus 2 by RT PCR NEGATIVE NEGATIVE Final    Comment: (NOTE) SARS-CoV-2 target nucleic acids are NOT DETECTED.  The SARS-CoV-2 RNA is generally  detectable in upper respiratory specimens during the acute phase of infection. The lowest concentration of SARS-CoV-2 viral copies this assay can detect is 138 copies/mL. A negative result does not preclude SARS-Cov-2 infection and should not be used as the sole basis for treatment or other patient management decisions. A negative result may occur with  improper specimen collection/handling, submission of specimen other than nasopharyngeal swab, presence of viral mutation(s) within the areas targeted by this assay, and inadequate number of viral copies(<138 copies/mL). A negative result must be combined with clinical observations, patient history, and epidemiological information. The expected result is Negative.  Fact Sheet for Patients:  EntrepreneurPulse.com.au  Fact Sheet for Healthcare Providers:  IncredibleEmployment.be  This test is no t yet approved or cleared by the [place] FDA and  has been authorized for detection and/or diagnosis of SARS-CoV-2 by FDA under an Emergency Use Authorization (EUA). This  EUA will remain  in effect (meaning this test can be used) for the duration of the COVID-19 declaration under Section 564(b)(1) of the Act, 21 U.S.C.section 360bbb-3(b)(1), unless the authorization is terminated  or revoked sooner.       Influenza A by PCR NEGATIVE NEGATIVE Final   Influenza B by PCR NEGATIVE NEGATIVE Final    Comment: (NOTE) The Xpert Xpress SARS-CoV-2/FLU/RSV plus assay is intended as an aid in the diagnosis of influenza from Nasopharyngeal swab specimens and should not be used as a sole basis for treatment. Nasal washings and aspirates are unacceptable for Xpert Xpress SARS-CoV-2/FLU/RSV testing.  Fact Sheet for Patients: EntrepreneurPulse.com.au  Fact Sheet for Healthcare Providers: IncredibleEmployment.be  This test is not yet approved or cleared by the [place] FDA  and has been authorized for detection and/or diagnosis of SARS-CoV-2 by FDA under an Emergency Use Authorization (EUA). This EUA will remain in effect (meaning this test can be used) for the duration of the COVID-19 declaration under Section 564(b)(1) of the Act, 21 U.S.C. section 360bbb-3(b)(1), unless the authorization is terminated or revoked.  Performed at [facility], [place] [address] [place], [place] [address]   Surgical pcr screen     Status: None   Collection Time: 11/08/20 12:13 AM   Specimen: Nasal Mucosa; Nasal Swab  Result Value Ref Range Status   MRSA, PCR NEGATIVE NEGATIVE Final   Staphylococcus aureus NEGATIVE NEGATIVE Final    Comment: (NOTE) The Xpert SA Assay (FDA approved for NASAL specimens in patients 10 years of age and older), is one component of a comprehensive surveillance program. It is not intended to diagnose infection nor to guide or monitor treatment. Performed at [facility], [place] [address], [place], [place] [address]   SARS CORONAVIRUS 2 (TAT 6-24 HRS) Nasopharyngeal Nasopharyngeal Swab     Status: None   Collection Time: 11/13/20 10:53 AM   Specimen: Nasopharyngeal Swab  Result Value Ref Range Status   SARS Coronavirus 2 NEGATIVE NEGATIVE Final    Comment: (NOTE) SARS-CoV-2 target nucleic acids are NOT DETECTED.  The SARS-CoV-2 RNA is generally detectable in upper and lower respiratory specimens during the acute phase of infection. Negative results do not preclude SARS-CoV-2 infection, do not rule out co-infections with other pathogens, and should not be used as the sole basis for treatment or other patient management decisions. Negative results must be combined with clinical observations, patient history, and epidemiological information. The expected result is Negative.  Fact Sheet for Patients: SugarRoll.be  Fact Sheet for Healthcare Providers: https://www.woods-mathews.com/  This  test is not yet approved or cleared by the [place] FDA and  has been authorized for detection and/or diagnosis of SARS-CoV-2 by FDA under an Emergency Use Authorization (EUA). This EUA will remain  in effect (meaning this test can be used) for the duration of the COVID-19 declaration under Se ction 564(b)(1) of the Act, 21 U.S.C. section 360bbb-3(b)(1), unless the authorization is terminated or revoked sooner.  Performed at [facility], [place] [address], [place], [place] [address]          Radiology Studies: No results found.      Scheduled Meds: . allopurinol  100 mg Oral Daily  . Chlorhexidine Gluconate Cloth  6 each Topical Q0600  . Chlorhexidine Gluconate Cloth  6 each Topical Q0600  . darbepoetin (ARANESP) injection - DIALYSIS  100 mcg Intravenous Q Fri-HD  . docusate sodium  100 mg Oral BID  . donepezil  10 mg Oral QHS  . feeding supplement (NEPRO CARB STEADY)  237 mL Oral TID WC  . fluticasone  1 spray Each Nare Daily  . gabapentin  100 mg Oral QHS  . heparin injection (subcutaneous)  5,000 Units Subcutaneous Q8H  . insulin aspart  0-6 Units Subcutaneous TID WC  . levothyroxine  50 mcg Oral QAC breakfast  . linagliptin  5 mg Oral Daily  . loratadine  10 mg Oral Daily  . memantine  10 mg Oral BID  . metoprolol tartrate  12.5 mg Oral BID  . midodrine  10 mg Oral Q M,W,F-HD  . multivitamin  1 tablet Oral QHS  . pantoprazole  40 mg Oral Daily  . sevelamer carbonate  2,400 mg Oral 3 times per day on Sun Tue Thu Sat  . sevelamer carbonate  2,400 mg Oral 2 times per day on Mon Wed Fri  . simvastatin  20 mg Oral QHS   Continuous Infusions: . sodium chloride    . sodium chloride       LOS: 8 days    Time spent: 25 minutes    Barb Merino, MD [facility] Pager [phone]

## 2020-11-15 NOTE — Progress Notes (Signed)
[facility] Progress Note   Subjective:     [name] was seen and examined today at bedside. Patient is s/p R intertrochanteric femur fracture. He reports R hip discomfort, but otherwise, has no issues or complaints. He is awaiting SNF placement.   Objective Vitals:   11/15/20 0054 11/15/20 0548 11/15/20 0729 11/15/20 1159  BP: (!) 121/45 (!) 122/33 (!) 109/35 (!) 108/35  Pulse: 80 70 66 61  Resp: 15  15 18   Temp: 99 F (37.2 C) 98.4 F (36.9 C) 98.1 F (36.7 C) 98.2 F (36.8 C)  TempSrc: Oral Oral Oral   SpO2: 100% 100% 100% 96%  Weight:      Height:       Physical Exam General: Appears comfortable; No evidence of acute respiratory distress Heart: Normal S1 and S2; No murmurs, gallops, friction rub Lungs: Clear throughout w/o evidence of rales, wheezing, and rhonchi Abdomen: Large, soft, non-tender, non-distended Extremities: No edema of bilateral upper extremities, hips, and lower extremities. Skin: 2X2 dressing intact to R hip; skin warm and dry Dialysis Access: R AVF (+) Bruit/Thrill  Filed Weights   11/11/20 0738 11/11/20 1047 11/13/20 1126  Weight: 75.1 kg 73.6 kg 73.6 kg    Intake/Output Summary (Last 24 hours) at 11/15/2020 1304 Last data filed at 11/15/2020 0019 Gross per 24 hour  Intake -  Output 1000 ml  Net -1000 ml    Additional Objective Labs: Basic Metabolic Panel: Recent Labs  Lab 11/09/20 1235 11/11/20 0826 11/15/20 0411  NA 137 139  --   K 4.5 3.8  --   CL 98 97*  --   CO2 26 28  --   GLUCOSE 98 147*  --   BUN 39* 44*  --   CREATININE 7.83* 8.21* 6.49*  CALCIUM 9.2 9.4  --   PHOS 5.4* 3.5  --    Liver Function Tests: Recent Labs  Lab 11/09/20 1235 11/11/20 0826  ALBUMIN 2.2* 2.1*   CBC: Recent Labs  Lab 11/09/20 0447 11/10/20 0119 11/11/20 0125  WBC 9.6 11.1* 10.1  HGB 8.1* 8.2* 7.8*  HCT 24.7* 23.8* 23.9*  MCV 101.2* 101.3* 101.7*  PLT 168 191 199   Blood Culture    Component Value Date/Time   SDES  WOUND RIGHT ARTERIAL FISTULA 09/01/2019 1305   SPECREQUEST NONE 09/01/2019 1305   CULT  09/01/2019 1305    FEW STAPHYLOCOCCUS AUREUS NO ANAEROBES ISOLATED Performed at [facility], [place] [address] [place], [place] [address]    REPTSTATUS 09/06/2019 FINAL 09/01/2019 1305    CBG: Recent Labs  Lab 11/14/20 1657 11/14/20 2025 11/15/20 0129 11/15/20 0727 11/15/20 1156  GLUCAP 97 139* 120* 120* 114*    Lab Results  Component Value Date   INR 0.97 07/22/2018   INR 1.01 06/05/2016   Medications:  . allopurinol  100 mg Oral Daily  . Chlorhexidine Gluconate Cloth  6 each Topical Q0600  . Chlorhexidine Gluconate Cloth  6 each Topical Q0600  . darbepoetin (ARANESP) injection - DIALYSIS  100 mcg Intravenous Q Fri-HD  . docusate sodium  100 mg Oral BID  . donepezil  10 mg Oral QHS  . feeding supplement (NEPRO CARB STEADY)  237 mL Oral TID WC  . fluticasone  1 spray Each Nare Daily  . gabapentin  100 mg Oral QHS  . heparin injection (subcutaneous)  5,000 Units Subcutaneous Q8H  . insulin aspart  0-6 Units Subcutaneous TID WC  . levothyroxine  50 mcg Oral QAC  breakfast  . linagliptin  5 mg Oral Daily  . loratadine  10 mg Oral Daily  . memantine  10 mg Oral BID  . metoprolol tartrate  12.5 mg Oral BID  . midodrine  10 mg Oral Q M,W,F-HD  . multivitamin  1 tablet Oral QHS  . pantoprazole  40 mg Oral Daily  . sevelamer carbonate  2,400 mg Oral 3 times per day on Sun Tue Thu Sat  . sevelamer carbonate  2,400 mg Oral 2 times per day on Mon Wed Fri  . simvastatin  20 mg Oral QHS    Dialysis Orders: 3hrs, BFR400, DFR500, EDW 74kg,3K/2.25Ca Access:RU AVF Heparinnone Mircera45mcg q2wks - last 126mcg on 10/28/20  Assessment/Plan:  1. R femur Frx -s/p cephalomedullary nailing of R intertrochanteric hip Frx on 2/15 by Dr. Lucia Gaskins.  2. ESRD- On HD MWF. Scheduled for HD tomorrow 11/16/20. 3. Hypertension/volume- Blood pressurein goal.Continue home meds.On  midodrine with HD.Does not appear volume overloaded. UF as tolerated.  4. Anemiaof CKD- Hgb7.8 post surgery. Will order CBC to be drawn pre-HD session tomorrow 11/16/20. Scheduled to receive Aranesp 187mcg on 11/18/20. Will continue to monitor Hgb trends. 5. Secondary Hyperparathyroidism -Corrected Ca high. Not on VDRA, use low Ca bath. Phos in goal, continue Renvela.  6. Nutrition- Currently on carb modified diet w/fluid restrictions.Protein supplements. Renal vitamin.  7. DM - per primary 8. Hypothyroidism- on synthroid.  9. DOE - eval by cardiology. Echo with EF 50-55%, G1DD. Plan for OP follow up. 10. Dispo: SNF vs. CIR   Johnny Poet, NP [facility] 11/15/2020,1:04 PM  LOS: 8 days

## 2020-11-15 NOTE — Progress Notes (Signed)
02/22 0053 Pt arrived on unit. Pt stable. Pt denies pain. Handoff received from dialysis RN. Dialysis RN stated pt tolerated dialysis well.

## 2020-11-15 NOTE — Progress Notes (Signed)
Nutrition Follow-up  DOCUMENTATION CODES:   Not applicable  INTERVENTION:   -Continue Nepro Shake po TID, each supplement provides 425 kcal and 19 grams protein -Continue renal MVI daily  NUTRITION DIAGNOSIS:   Increased nutrient needs related to post-op healing as evidenced by estimated needs.  Ongoing  GOAL:   Patient will meet greater than or equal to 90% of their needs  Progressing  MONITOR:   PO intake,Supplement acceptance,Diet advancement,Labs,Weight trends,Skin,I & O's  REASON FOR ASSESSMENT:   Consult Assessment of nutrition requirement/status,Hip fracture protocol  ASSESSMENT:   [name] is a 80 y.o. male with medical history significant of ESRD on HD, HTN, IDDM, diabetic neuropathy, hypothyroidism, gout, chronic ambulation dysfunction, presented with mechanical fall and fracture.  2/15- s/p PROCEDURE: Cephalomedullary nailing of right intertrochanteric hip fracture Intraoperative use of fluoroscopy  Reviewed I/O's: -1 L x 24 hours and -3.6 L since admission  Pt unavailable at time of visit.   Pt remains with good appetite. Noted meal completions 50-100%. Pt is consuming Nepro supplements.   Reviewed wt hx; pt has experienced a 3.4% wt loss over the past 6 months, which is not significant for time frame. Per nephrology notes, EDW 74 kg.   Medications reviewed and include colace and renvela.   Per MD notes, pt is medically stable for discharge; awaiting SNF placement.   Labs reviewed: CBGS: 97-139 (inpatient orders for glycemic control are 0-6 units insulin aspart TID with meals and 5 mg linagliptin daily).   Diet Order:   Diet Order            Diet - low sodium heart healthy           Diet Carb Modified Fluid consistency: Thin; Room service appropriate? Yes  Diet effective now                 EDUCATION NEEDS:   No education needs have been identified at this time  Skin:  Skin Assessment: Skin Integrity Issues: Skin Integrity  Issues:: Other (Comment) Incisions: closed rt hip Other: MASD to buttocks  Last BM:  11/14/20  Height:   Ht Readings from Last 1 Encounters:  11/13/20 5\' 10"  (1.778 m)    Weight:   Wt Readings from Last 1 Encounters:  11/13/20 73.6 kg    Ideal Body Weight:  78.2 kg  BMI:  Body mass index is 23.28 kg/m.  Estimated Nutritional Needs:   Kcal:  2200-2400  Protein:  110-125 grams  Fluid:  1000 ml + UOP    Loistine Chance, RD, LDN, [place] Registered Dietitian II Certified Diabetes Care and Education Specialist Please refer to [facility] for RD and/or RD on-call/weekend/after hours pager

## 2020-11-16 DIAGNOSIS — Z992 Dependence on renal dialysis: Secondary | ICD-10-CM | POA: Diagnosis not present

## 2020-11-16 DIAGNOSIS — M6281 Muscle weakness (generalized): Secondary | ICD-10-CM | POA: Diagnosis not present

## 2020-11-16 DIAGNOSIS — Z87820 Personal history of traumatic brain injury: Secondary | ICD-10-CM | POA: Diagnosis not present

## 2020-11-16 DIAGNOSIS — R5383 Other fatigue: Secondary | ICD-10-CM | POA: Diagnosis not present

## 2020-11-16 DIAGNOSIS — S7291XA Unspecified fracture of right femur, initial encounter for closed fracture: Secondary | ICD-10-CM | POA: Diagnosis not present

## 2020-11-16 DIAGNOSIS — S72144S Nondisplaced intertrochanteric fracture of right femur, sequela: Secondary | ICD-10-CM | POA: Diagnosis not present

## 2020-11-16 DIAGNOSIS — I739 Peripheral vascular disease, unspecified: Secondary | ICD-10-CM | POA: Diagnosis not present

## 2020-11-16 DIAGNOSIS — N2581 Secondary hyperparathyroidism of renal origin: Secondary | ICD-10-CM | POA: Diagnosis not present

## 2020-11-16 DIAGNOSIS — R413 Other amnesia: Secondary | ICD-10-CM | POA: Diagnosis not present

## 2020-11-16 DIAGNOSIS — R269 Unspecified abnormalities of gait and mobility: Secondary | ICD-10-CM | POA: Diagnosis not present

## 2020-11-16 DIAGNOSIS — R1312 Dysphagia, oropharyngeal phase: Secondary | ICD-10-CM | POA: Diagnosis not present

## 2020-11-16 DIAGNOSIS — E876 Hypokalemia: Secondary | ICD-10-CM | POA: Diagnosis not present

## 2020-11-16 DIAGNOSIS — E119 Type 2 diabetes mellitus without complications: Secondary | ICD-10-CM | POA: Diagnosis not present

## 2020-11-16 DIAGNOSIS — I7389 Other specified peripheral vascular diseases: Secondary | ICD-10-CM | POA: Diagnosis not present

## 2020-11-16 DIAGNOSIS — S72144A Nondisplaced intertrochanteric fracture of right femur, initial encounter for closed fracture: Secondary | ICD-10-CM | POA: Diagnosis not present

## 2020-11-16 DIAGNOSIS — W19XXXA Unspecified fall, initial encounter: Secondary | ICD-10-CM | POA: Diagnosis not present

## 2020-11-16 DIAGNOSIS — R41841 Cognitive communication deficit: Secondary | ICD-10-CM | POA: Diagnosis not present

## 2020-11-16 DIAGNOSIS — L89152 Pressure ulcer of sacral region, stage 2: Secondary | ICD-10-CM | POA: Diagnosis not present

## 2020-11-16 DIAGNOSIS — I120 Hypertensive chronic kidney disease with stage 5 chronic kidney disease or end stage renal disease: Secondary | ICD-10-CM | POA: Diagnosis not present

## 2020-11-16 DIAGNOSIS — Z87898 Personal history of other specified conditions: Secondary | ICD-10-CM | POA: Diagnosis not present

## 2020-11-16 DIAGNOSIS — R262 Difficulty in walking, not elsewhere classified: Secondary | ICD-10-CM | POA: Diagnosis not present

## 2020-11-16 DIAGNOSIS — F0280 Dementia in other diseases classified elsewhere without behavioral disturbance: Secondary | ICD-10-CM | POA: Diagnosis not present

## 2020-11-16 DIAGNOSIS — S72001D Fracture of unspecified part of neck of right femur, subsequent encounter for closed fracture with routine healing: Secondary | ICD-10-CM | POA: Diagnosis not present

## 2020-11-16 DIAGNOSIS — Z4789 Encounter for other orthopedic aftercare: Secondary | ICD-10-CM | POA: Diagnosis not present

## 2020-11-16 DIAGNOSIS — E785 Hyperlipidemia, unspecified: Secondary | ICD-10-CM | POA: Diagnosis not present

## 2020-11-16 DIAGNOSIS — M25551 Pain in right hip: Secondary | ICD-10-CM | POA: Diagnosis not present

## 2020-11-16 DIAGNOSIS — N186 End stage renal disease: Secondary | ICD-10-CM | POA: Diagnosis not present

## 2020-11-16 DIAGNOSIS — E1149 Type 2 diabetes mellitus with other diabetic neurological complication: Secondary | ICD-10-CM | POA: Diagnosis not present

## 2020-11-16 DIAGNOSIS — I959 Hypotension, unspecified: Secondary | ICD-10-CM | POA: Diagnosis not present

## 2020-11-16 DIAGNOSIS — I150 Renovascular hypertension: Secondary | ICD-10-CM | POA: Diagnosis not present

## 2020-11-16 DIAGNOSIS — Z9889 Other specified postprocedural states: Secondary | ICD-10-CM | POA: Diagnosis not present

## 2020-11-16 DIAGNOSIS — E7849 Other hyperlipidemia: Secondary | ICD-10-CM | POA: Diagnosis not present

## 2020-11-16 DIAGNOSIS — S79929A Unspecified injury of unspecified thigh, initial encounter: Secondary | ICD-10-CM | POA: Diagnosis not present

## 2020-11-16 DIAGNOSIS — S72141A Displaced intertrochanteric fracture of right femur, initial encounter for closed fracture: Secondary | ICD-10-CM | POA: Diagnosis not present

## 2020-11-16 DIAGNOSIS — Z7401 Bed confinement status: Secondary | ICD-10-CM | POA: Diagnosis not present

## 2020-11-16 DIAGNOSIS — E1129 Type 2 diabetes mellitus with other diabetic kidney complication: Secondary | ICD-10-CM | POA: Diagnosis not present

## 2020-11-16 DIAGNOSIS — D638 Anemia in other chronic diseases classified elsewhere: Secondary | ICD-10-CM | POA: Diagnosis not present

## 2020-11-16 DIAGNOSIS — E1140 Type 2 diabetes mellitus with diabetic neuropathy, unspecified: Secondary | ICD-10-CM | POA: Diagnosis not present

## 2020-11-16 DIAGNOSIS — D509 Iron deficiency anemia, unspecified: Secondary | ICD-10-CM | POA: Diagnosis not present

## 2020-11-16 DIAGNOSIS — D538 Other specified nutritional anemias: Secondary | ICD-10-CM | POA: Diagnosis not present

## 2020-11-16 DIAGNOSIS — D631 Anemia in chronic kidney disease: Secondary | ICD-10-CM | POA: Diagnosis not present

## 2020-11-16 DIAGNOSIS — D539 Nutritional anemia, unspecified: Secondary | ICD-10-CM | POA: Diagnosis not present

## 2020-11-16 DIAGNOSIS — F0390 Unspecified dementia without behavioral disturbance: Secondary | ICD-10-CM | POA: Diagnosis not present

## 2020-11-16 DIAGNOSIS — I471 Supraventricular tachycardia: Secondary | ICD-10-CM | POA: Diagnosis not present

## 2020-11-16 DIAGNOSIS — M2550 Pain in unspecified joint: Secondary | ICD-10-CM | POA: Diagnosis not present

## 2020-11-16 DIAGNOSIS — R2681 Unsteadiness on feet: Secondary | ICD-10-CM | POA: Diagnosis not present

## 2020-11-16 DIAGNOSIS — E1122 Type 2 diabetes mellitus with diabetic chronic kidney disease: Secondary | ICD-10-CM | POA: Diagnosis not present

## 2020-11-16 LAB — CBC
HCT: 22 % — ABNORMAL LOW (ref 39.0–52.0)
Hemoglobin: 7 g/dL — ABNORMAL LOW (ref 13.0–17.0)
MCH: 33.3 pg (ref 26.0–34.0)
MCHC: 31.8 g/dL (ref 30.0–36.0)
MCV: 104.8 fL — ABNORMAL HIGH (ref 80.0–100.0)
Platelets: 301 10*3/uL (ref 150–400)
RBC: 2.1 MIL/uL — ABNORMAL LOW (ref 4.22–5.81)
RDW: 16.4 % — ABNORMAL HIGH (ref 11.5–15.5)
WBC: 9.8 10*3/uL (ref 4.0–10.5)
nRBC: 0.2 % (ref 0.0–0.2)

## 2020-11-16 LAB — GLUCOSE, CAPILLARY
Glucose-Capillary: 103 mg/dL — ABNORMAL HIGH (ref 70–99)
Glucose-Capillary: 115 mg/dL — ABNORMAL HIGH (ref 70–99)

## 2020-11-16 LAB — RENAL FUNCTION PANEL
Albumin: 2.1 g/dL — ABNORMAL LOW (ref 3.5–5.0)
Anion gap: 12 (ref 5–15)
BUN: 52 mg/dL — ABNORMAL HIGH (ref 8–23)
CO2: 28 mmol/L (ref 22–32)
Calcium: 10 mg/dL (ref 8.9–10.3)
Chloride: 96 mmol/L — ABNORMAL LOW (ref 98–111)
Creatinine, Ser: 8.95 mg/dL — ABNORMAL HIGH (ref 0.61–1.24)
GFR, Estimated: 6 mL/min — ABNORMAL LOW (ref >60)
Glucose, Bld: 110 mg/dL — ABNORMAL HIGH (ref 70–99)
Phosphorus: 4.1 mg/dL (ref 2.5–4.6)
Potassium: 3.9 mmol/L (ref 3.5–5.1)
Sodium: 136 mmol/L (ref 135–145)

## 2020-11-16 MED ORDER — SODIUM CHLORIDE 0.9 % IV SOLN: 100.0000 mL | INTRAVENOUS | Status: DC | PRN

## 2020-11-16 MED ORDER — LIDOCAINE-PRILOCAINE 2.5-2.5 % EX CREA: 1.0000 "application " | TOPICAL_CREAM | CUTANEOUS | Status: DC | PRN

## 2020-11-16 MED ORDER — ALTEPLASE 2 MG IJ SOLR: 2.0000 mg | Freq: Once | INTRAMUSCULAR | Status: DC | PRN

## 2020-11-16 MED ORDER — PENTAFLUOROPROP-TETRAFLUOROETH EX AERO: 1.0000 "application " | INHALATION_SPRAY | CUTANEOUS | Status: DC | PRN

## 2020-11-16 MED ORDER — HEPARIN SODIUM (PORCINE) 1000 UNIT/ML DIALYSIS: 1000.0000 [IU] | INTRAMUSCULAR | Status: DC | PRN

## 2020-11-16 MED ORDER — LIDOCAINE HCL (PF) 1 % IJ SOLN: 5.0000 mL | INTRAMUSCULAR | Status: DC | PRN

## 2020-11-16 NOTE — Progress Notes (Signed)
PROGRESS NOTE    [name]  QJJ:[id number] DOB: [birth year] DOA: 11/07/2020 PCP: Elby Showers, MD    Brief Narrative:  80 year old gentleman with history of ESRD on hemodialysis Monday, Wednesday, Friday, dementia, hypertension, insulin-dependent diabetes, diabetic neuropathy, hypothyroidism presented to the ER with fall at home 2 days ago, unable to get up by himself.  Found to have right intertrochanteric femur fracture. 2/23.  Clinically stable for discharge.  Discharge summary updated.  Assessment & Plan:   Principal Problem:   Closed nondisplaced intertrochanteric fracture of right femur (HCC) Active Problems:   Hyperlipidemia   Essential hypertension   Hypothyroidism   BPH (benign prostatic hyperplasia)   End stage renal disease (HCC)   OSA (obstructive sleep apnea)   Diabetes mellitus type 2 in nonobese (HCC)   Anemia   Pre-operative clearance  Closed nondisplaced intertrochanteric fracture of the right femur, traumatic: Status post ORIF 2/15 Weightbearing as tolerated DVT prophylaxis with heparin subcu, aspirin on discharge. Pain control with Vicodin along with laxatives. Refer to skilled nursing facility rehab as per PT OT recommendation. Waiting for bed availability.  ESRD on hemodialysis: Getting dialysis on his schedule.  Anemia of chronic disease: Hemoglobin is stable.  Replaced with ESA with hemodialysis.  Hyperlipidemia: Continue Zocor.  Essential hypertension: As needed hydralazine.  Sinus tachycardia: Asymptomatic episodic sinus tachycardia, started on low-dose metoprolol with improvement.  Type 2 diabetes: Fairly stable.  On sliding scale.  A1c 4.9.  Patient is medically stable.  Transfer to skilled nursing facility today.   DVT prophylaxis: heparin injection 5,000 Units Start: 11/09/20 0600 SCDs Start: 11/08/20 1229 SCDs Start: 11/07/20 1442   Code Status: Partial Family Communication: Patient's wife on the phone by Research officer, political party. Disposition Plan: Status is: Inpatient  Remains inpatient appropriate because:Unsafe d/c plan   Dispo: The patient is from: Home              Anticipated d/c is to: SNF              Anticipated d/c date is: Today.              Patient is currently stable to discharge to skilled level of care.   Difficult to place patient No   Patient is medically stable to transfer to a skilled level of care.      Consultants:   Orthopedics  Nephrology  Procedures:   Right hip ORIF 2/15  Antimicrobials:   None, received perioperative antibiotics   Subjective: No overnight events.  Came back from dialysis.  Pleasant.  Does have some pain on his right thigh. Objective: Vitals:   11/16/20 0930 11/16/20 1000 11/16/20 1025 11/16/20 1113  BP: (!) 129/47 (!) 120/45 (!) 131/51 (!) 137/50  Pulse: 71 72 74 78  Resp: 13 13 15    Temp:   98.2 F (36.8 C) 97.8 F (36.6 C)  TempSrc:   Oral Oral  SpO2:   100% 100%  Weight:      Height:        Intake/Output Summary (Last 24 hours) at 11/16/2020 1116 Last data filed at 11/16/2020 1025 Gross per 24 hour  Intake --  Output 1001 ml  Net -1001 ml   Filed Weights   11/11/20 1047 11/13/20 1126 11/16/20 0710  Weight: 73.6 kg 73.6 kg 74.6 kg    Examination:  General: Looks comfortable.  Pleasant.  Has cognitive deficits and poor historian. Cardiovascular: S1-S2 normal. Respiratory: Bilateral clear. Gastrointestinal: Soft and nontender. Ext: Equal motor power.  Right lateral thigh incision clean and dry. Right upper extremity AV fistula intact.     Data Reviewed: I have personally reviewed following labs and imaging studies  CBC: Recent Labs  Lab 11/10/20 0119 11/11/20 0125 11/16/20 0627  WBC 11.1* 10.1 9.8  HGB 8.2* 7.8* 7.0*  HCT 23.8* 23.9* 22.0*  MCV 101.3* 101.7* 104.8*  PLT 191 199 161   Basic Metabolic Panel: Recent Labs  Lab 11/09/20 1235 11/11/20 0826 11/15/20 0411 11/16/20 0700  NA 137 139  --  136   K 4.5 3.8  --  3.9  CL 98 97*  --  96*  CO2 26 28  --  28  GLUCOSE 98 147*  --  110*  BUN 39* 44*  --  52*  CREATININE 7.83* 8.21* 6.49* 8.95*  CALCIUM 9.2 9.4  --  10.0  PHOS 5.4* 3.5  --  4.1   GFR: Estimated Creatinine Clearance: 6.9 mL/min (A) (by C-G formula based on SCr of 8.95 mg/dL (H)). Liver Function Tests: Recent Labs  Lab 11/09/20 1235 11/11/20 0826 11/16/20 0700  ALBUMIN 2.2* 2.1* 2.1*   No results for input(s): LIPASE, AMYLASE in the last 168 hours. No results for input(s): AMMONIA in the last 168 hours. Coagulation Profile: No results for input(s): INR, PROTIME in the last 168 hours. Cardiac Enzymes: No results for input(s): CKTOTAL, CKMB, CKMBINDEX, TROPONINI in the last 168 hours. BNP (last 3 results) No results for input(s): PROBNP in the last 8760 hours. HbA1C: No results for input(s): HGBA1C in the last 72 hours. CBG: Recent Labs  Lab 11/15/20 0727 11/15/20 1156 11/15/20 1610 11/15/20 2045 11/16/20 0641  GLUCAP 120* 114* 139* 127* 103*   Lipid Profile: No results for input(s): CHOL, HDL, LDLCALC, TRIG, CHOLHDL, LDLDIRECT in the last 72 hours. Thyroid Function Tests: No results for input(s): TSH, T4TOTAL, FREET4, T3FREE, THYROIDAB in the last 72 hours. Anemia Panel: No results for input(s): VITAMINB12, FOLATE, FERRITIN, TIBC, IRON, RETICCTPCT in the last 72 hours. Sepsis Labs: No results for input(s): PROCALCITON, LATICACIDVEN in the last 168 hours.  Recent Results (from the past 240 hour(s))  Resp Panel by RT-PCR (Flu A&B, Covid) Nasopharyngeal Swab     Status: None   Collection Time: 11/07/20  1:38 PM   Specimen: Nasopharyngeal Swab; Nasopharyngeal(NP) swabs in vial transport medium  Result Value Ref Range Status   SARS Coronavirus 2 by RT PCR NEGATIVE NEGATIVE Final    Comment: (NOTE) SARS-CoV-2 target nucleic acids are NOT DETECTED.  The SARS-CoV-2 RNA is generally detectable in upper respiratory specimens during the acute phase of  infection. The lowest concentration of SARS-CoV-2 viral copies this assay can detect is 138 copies/mL. A negative result does not preclude SARS-Cov-2 infection and should not be used as the sole basis for treatment or other patient management decisions. A negative result may occur with  improper specimen collection/handling, submission of specimen other than nasopharyngeal swab, presence of viral mutation(s) within the areas targeted by this assay, and inadequate number of viral copies(<138 copies/mL). A negative result must be combined with clinical observations, patient history, and epidemiological information. The expected result is Negative.  Fact Sheet for Patients:  EntrepreneurPulse.com.au  Fact Sheet for Healthcare Providers:  IncredibleEmployment.be  This test is no t yet approved or cleared by the [place] FDA and  has been authorized for detection and/or diagnosis of SARS-CoV-2 by FDA under an Emergency Use Authorization (EUA). This EUA will remain  in effect (meaning this test can be used) for the  duration of the COVID-19 declaration under Section 564(b)(1) of the Act, 21 U.S.C.section 360bbb-3(b)(1), unless the authorization is terminated  or revoked sooner.       Influenza A by PCR NEGATIVE NEGATIVE Final   Influenza B by PCR NEGATIVE NEGATIVE Final    Comment: (NOTE) The Xpert Xpress SARS-CoV-2/FLU/RSV plus assay is intended as an aid in the diagnosis of influenza from Nasopharyngeal swab specimens and should not be used as a sole basis for treatment. Nasal washings and aspirates are unacceptable for Xpert Xpress SARS-CoV-2/FLU/RSV testing.  Fact Sheet for Patients: EntrepreneurPulse.com.au  Fact Sheet for Healthcare Providers: IncredibleEmployment.be  This test is not yet approved or cleared by the [place] FDA and has been authorized for detection and/or diagnosis of SARS-CoV-2  by FDA under an Emergency Use Authorization (EUA). This EUA will remain in effect (meaning this test can be used) for the duration of the COVID-19 declaration under Section 564(b)(1) of the Act, 21 U.S.C. section 360bbb-3(b)(1), unless the authorization is terminated or revoked.  Performed at [facility], [place] [address] [place], [place] [address]   Surgical pcr screen     Status: None   Collection Time: 11/08/20 12:13 AM   Specimen: Nasal Mucosa; Nasal Swab  Result Value Ref Range Status   MRSA, PCR NEGATIVE NEGATIVE Final   Staphylococcus aureus NEGATIVE NEGATIVE Final    Comment: (NOTE) The Xpert SA Assay (FDA approved for NASAL specimens in patients 34 years of age and older), is one component of a comprehensive surveillance program. It is not intended to diagnose infection nor to guide or monitor treatment. Performed at [facility], [place] [address], [place], [place] [address]   SARS CORONAVIRUS 2 (TAT 6-24 HRS) Nasopharyngeal Nasopharyngeal Swab     Status: None   Collection Time: 11/13/20 10:53 AM   Specimen: Nasopharyngeal Swab  Result Value Ref Range Status   SARS Coronavirus 2 NEGATIVE NEGATIVE Final    Comment: (NOTE) SARS-CoV-2 target nucleic acids are NOT DETECTED.  The SARS-CoV-2 RNA is generally detectable in upper and lower respiratory specimens during the acute phase of infection. Negative results do not preclude SARS-CoV-2 infection, do not rule out co-infections with other pathogens, and should not be used as the sole basis for treatment or other patient management decisions. Negative results must be combined with clinical observations, patient history, and epidemiological information. The expected result is Negative.  Fact Sheet for Patients: SugarRoll.be  Fact Sheet for Healthcare Providers: https://www.woods-mathews.com/  This test is not yet approved or cleared by the [place] FDA and  has  been authorized for detection and/or diagnosis of SARS-CoV-2 by FDA under an Emergency Use Authorization (EUA). This EUA will remain  in effect (meaning this test can be used) for the duration of the COVID-19 declaration under Se ction 564(b)(1) of the Act, 21 U.S.C. section 360bbb-3(b)(1), unless the authorization is terminated or revoked sooner.  Performed at [facility], [place] [address] [place], [place] [address]          Radiology Studies: No results found.      Scheduled Meds: . allopurinol  100 mg Oral Daily  . Chlorhexidine Gluconate Cloth  6 each Topical Q0600  . Chlorhexidine Gluconate Cloth  6 each Topical Q0600  . darbepoetin (ARANESP) injection - DIALYSIS  100 mcg Intravenous Q Fri-HD  . docusate sodium  100 mg Oral BID  . donepezil  10 mg Oral QHS  . feeding supplement (NEPRO CARB STEADY)  237 mL  Oral TID WC  . fluticasone  1 spray Each Nare Daily  . gabapentin  100 mg Oral QHS  . heparin injection (subcutaneous)  5,000 Units Subcutaneous Q8H  . insulin aspart  0-6 Units Subcutaneous TID WC  . levothyroxine  50 mcg Oral QAC breakfast  . linagliptin  5 mg Oral Daily  . loratadine  10 mg Oral Daily  . memantine  10 mg Oral BID  . metoprolol tartrate  12.5 mg Oral BID  . midodrine  10 mg Oral Q M,W,F-HD  . multivitamin  1 tablet Oral QHS  . pantoprazole  40 mg Oral Daily  . sevelamer carbonate  2,400 mg Oral 3 times per day on Sun Tue Thu Sat  . sevelamer carbonate  2,400 mg Oral 2 times per day on Mon Wed Fri  . simvastatin  20 mg Oral QHS   Continuous Infusions:    LOS: 9 days    Time spent: 25 minutes    Barb Merino, MD [facility] Pager [phone]

## 2020-11-16 NOTE — Progress Notes (Signed)
Occupational Therapy Treatment Patient Details Name: [name] MRN: [id number] DOB: 1941/01/24 Today's Date: 11/16/2020    History of present illness [name] is a 80 y.o. male with medical history significant of ESRD on HD, HTN, IDDM, diabetic neuropathy, hypothyroidism, gout, chronic ambulation dysfunction, presented with mechanical fall sustaining a R intertrochanteric  fx who underwent and R IM Nail on 2/15.   OT comments  Pt in bed upon arrival with max encouragement to attempt sitting EOB. Pt continues to need significantly increased time/encouragement to initiate mobility tasks and initially agreeable to sit EOB, however once pt rolled to R side, pt refusing further mobility and pushing back to lay back down and returned to supine. Pt participated in grooming and UB ADL tasks at bed level.   Follow Up Recommendations  SNF;Supervision/Assistance - 24 hour    Equipment Recommendations  Other (comment);3 in 1 bedside commode (TBD at SNF)    Recommendations for Other Services      Precautions / Restrictions Precautions Precautions: Fall Precaution Comments: watch HR Restrictions Weight Bearing Restrictions: Yes RLE Weight Bearing: Weight bearing as tolerated       Mobility Bed Mobility Overal bed mobility: Needs Assistance Bed Mobility: Supine to Sit;Sit to Supine     Supine to sit: Total assist Sit to supine: Total assist   General bed mobility comments: max encouragement to sit EOB, increased time and effort , cues to use rails. pt performed partial transfer to EOB to R side however once RLE over EOB pt pushing  trunk back to return tosupine and refusing to raise trunk fully, verbalized fear of falling, yelling out "help, help"    Transfers                 General transfer comment: pt declined at this time    Balance Overall balance assessment: Needs assistance Sitting-balance support: Feet supported;Bilateral upper extremity supported Sitting  balance-Leahy Scale: Zero Sitting balance - Comments: unable to complete sitting EOB due to  pt too anxiousrefusing                                   ADL either performed or assessed with clinical judgement   ADL Overall ADL's : Needs assistance/impaired     Grooming: Wash/dry hands;Wash/dry face;Minimal assistance;Set up;Supervision/safety;Bed level   Upper Body Bathing: Minimal assistance;Bed level Upper Body Bathing Details (indicate cue type and reason): simulated     Upper Body Dressing : Minimal assistance;Bed level Upper Body Dressing Details (indicate cue type and reason): donned clean gown       Toilet Transfer Details (indicate cue type and reason): pt declined Toileting- Clothing Manipulation and Hygiene: Total assistance;Bed level         General ADL Comments: pt declined completing bed mobility to sit EOB for ADL tasks     Vision Baseline Vision/History: Wears glasses Wears Glasses: Reading only Patient Visual Report: No change from baseline     Perception     Praxis      Cognition Arousal/Alertness: Awake/alert Behavior During Therapy: WFL for tasks assessed/performed Overall Cognitive Status: Impaired/Different from baseline Area of Impairment: Attention;Memory;Safety/judgement;Awareness;Problem solving                 Orientation Level: Time;Disoriented to;Place;Situation   Memory: Decreased short-term memory Following Commands: Follows one step commands inconsistently;Follows one step commands with increased time Safety/Judgement: Decreased awareness of safety;Decreased awareness of deficits  Problem Solving: Slow processing;Decreased initiation;Difficulty sequencing;Requires verbal cues;Requires tactile cues General Comments: Pt verbalized understanding of importance of mobility, but declining multiple attempts, internally distracted by pain. Agreeable to EOB but then unable to tolerate        Exercises     Shoulder  Instructions       General Comments      Pertinent Vitals/ Pain       Pain Assessment: Faces Faces Pain Scale: Hurts even more Pain Location: R hip with mobility Pain Descriptors / Indicators: Grimacing;Guarding;Moaning Pain Intervention(s): Limited activity within patient's tolerance;Monitored during session;Repositioned  Home Living                                          Prior Functioning/Environment              Frequency  Min 2X/week        Progress Toward Goals  OT Goals(current goals can now be found in the care plan section)  Progress towards OT goals: Not progressing toward goals - comment (pt limited by pain and anxiety)  Acute Rehab OT Goals Patient Stated Goal: none stated  Plan Discharge plan remains appropriate    Co-evaluation                 AM-PAC OT "6 Clicks" Daily Activity     Outcome Measure   Help from another person eating meals?: None Help from another person taking care of personal grooming?: A Little Help from another person toileting, which includes using toliet, bedpan, or urinal?: Total Help from another person bathing (including washing, rinsing, drying)?: A Lot Help from another person to put on and taking off regular upper body clothing?: A Little Help from another person to put on and taking off regular lower body clothing?: Total 6 Click Score: 14    End of Session    OT Visit Diagnosis: Other abnormalities of gait and mobility (R26.89);History of falling (Z91.81);Muscle weakness (generalized) (M62.81);Pain;Other symptoms and signs involving cognitive function Pain - Right/Left: Right Pain - part of body: Leg;Hip   Activity Tolerance Patient limited by fatigue;Patient limited by pain   Patient Left in bed;with call bell/phone within reach;with bed alarm set   Nurse Communication          Time: 3532-9924 OT Time Calculation (min): 14 min  Charges: OT General Charges $OT Visit: 1  Visit OT Treatments $Therapeutic Activity: 8-22 mins     Britt Bottom 11/16/2020, 4:12 PM

## 2020-11-16 NOTE — Progress Notes (Signed)
Report given to Telela at [facility]. All questions answered. Pt belongings gathered to be sent home. Waiting on [facility] for transport.

## 2020-11-16 NOTE — Plan of Care (Signed)
  Problem: Pain Managment: Goal: General experience of comfort will improve Outcome: Adequate for Discharge   Problem: Safety: Goal: Ability to remain free from injury will improve Outcome: Adequate for Discharge   

## 2020-11-16 NOTE — Plan of Care (Signed)
  Problem: Education: Goal: Knowledge of General Education information will improve Description: Including pain rating scale, medication(s)/side effects and non-pharmacologic comfort measures Outcome: Progressing   Problem: Health Behavior/Discharge Planning: Goal: Ability to manage health-related needs will improve Outcome: Progressing   Problem: Activity: Goal: Risk for activity intolerance will decrease Outcome: Progressing   Problem: Nutrition: Goal: Adequate nutrition will be maintained Outcome: Progressing   Problem: Coping: Goal: Level of anxiety will decrease Outcome: Progressing  No acute events took place overnight. Will continue to monitor

## 2020-11-16 NOTE — Discharge Summary (Signed)
Physician Discharge Summary  [name] EBR:[id number] DOB: 04/01/1941 DOA: 11/07/2020  PCP: Elby Showers, MD  Admit date: 11/07/2020 Discharge date: 11/16/2020  Admitted From: Home Disposition: Skilled nursing facility  Recommendations for Outpatient Follow-up:  1. Follow up with PCP in 1-2 weeks after discharge. 2. Please obtain BMP/CBC in one week 3. Continue hemodialysis as a scheduled. 4. Orthopedics to schedule follow-up.  Discharge Condition: Stable CODE STATUS: Partial code, he will be full code for CPR purpose. 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  Found to have right intertrochanteric femur fracture.  Assessment & Plan of care:   1. Closed nondisplaced intertrochanteric fracture of the right femur, traumatic: Status post ORIF 2/15 Weightbearing as tolerated DVT prophylaxis with heparin subcu, aspirin on discharge Pain control with Vicodin along with laxatives. Refer to skilled nursing facility rehab as per PT OT recommendation.  2. ESRD on hemodialysis: Getting dialysis on his schedule.  3. Anemia of chronic disease: Hemoglobin is stable.  Replaced with ESA with hemodialysis.  4. Hyperlipidemia: Continue Zocor.  5. Essential hypertension: As needed hydralazine.  6. Sinus tachycardia: Asymptomatic episodic atrial tachycardia.  Will start patient on low-dose beta-blockers.  7. Type 2 diabetes: Fairly stable.  On sliding scale with carb count and Tradjenta. A1c 4.9.  2/23.  Discharge summary is updated.  Stable to transfer.    Discharge Diagnoses:  Principal Problem:   Closed nondisplaced intertrochanteric fracture of right femur ([place]) Active Problems:   Hyperlipidemia   Essential  hypertension   Hypothyroidism   BPH (benign prostatic hyperplasia)   End stage renal disease (HCC)   OSA (obstructive sleep apnea)   Diabetes mellitus type 2 in nonobese (HCC)   Anemia   Pre-operative clearance    Discharge Instructions  Discharge Instructions    Call MD for:  redness, tenderness, or signs of infection (pain, swelling, redness, odor or green/yellow discharge around incision site)   Complete by: As directed    Call MD for:  severe uncontrolled pain   Complete by: As directed    Call MD for:  temperature >100.4   Complete by: As directed    Diet - low sodium heart healthy   Complete by: As directed    Increase activity slowly   Complete by: As directed    Increase activity slowly   Complete by: As directed    No dressing needed   Complete by: As directed    No dressing needed   Complete by: As directed      Allergies as of 11/16/2020      Reactions   Ambien [zolpidem Tartrate] Other (See Comments)   Hallucinations and "felt crazy"    Penicillins Rash, Hives   Has patient had a PCN reaction causing immediate rash, facial/tongue/throat swelling, SOB or lightheadedness with hypotension: Yes Has patient had a PCN reaction causing severe rash involving mucus membranes or skin necrosis: Yes Has patient had a PCN reaction that required hospitalization: No Has patient had a PCN reaction occurring within the last 10 years: No If all of the above answers are "NO", then may proceed with Cephalosporin use. Other reaction(s): HIVES      Medication List    STOP taking these medications   dicyclomine 20 MG tablet Commonly known as: BENTYL  TAKE these medications   acetaminophen 325 MG tablet Commonly known as: TYLENOL Take 1-2 tablets (325-650 mg total) by mouth every 4 (four) hours as needed for mild pain. What changed:   how much to take  when to take this   albuterol 108 (90 Base) MCG/ACT inhaler Commonly known as: VENTOLIN HFA Inhale 1 puff into  the lungs daily as needed for wheezing or shortness of breath.   allopurinol 100 MG tablet Commonly known as: ZYLOPRIM Take 100 mg by mouth daily.   aspirin EC 325 MG tablet Take 1 tablet (325 mg total) by mouth daily.   camphor-menthol lotion Commonly known as: SARNA Apply 1 application topically 4 (four) times daily as needed for itching.   cetirizine 10 MG chewable tablet Commonly known as: ZYRTEC Chew 10 mg by mouth every Monday, Wednesday, and Friday.   clobetasol 0.05 % external solution Commonly known as: TEMOVATE Apply 1 application topically 2 (two) times daily as needed (scalp irritation.).   denosumab 60 MG/ML Sosy injection Commonly known as: PROLIA Inject 60 mg into the skin every 6 (six) months.   docusate sodium 100 MG capsule Commonly known as: COLACE Take 1 capsule (100 mg total) by mouth 2 (two) times daily.   donepezil 10 MG tablet Commonly known as: ARICEPT Take 1 tablet (10 mg total) by mouth at bedtime.   feeding supplement (NEPRO CARB STEADY) Liqd Take 237 mLs by mouth in the morning and at bedtime.   Fluocinolone Acetonide Scalp 0.01 % Oil Apply 1 application topically daily as needed (scalp irritation.).   fluticasone 50 MCG/ACT nasal spray Commonly known as: FLONASE Place 1 spray into both nostrils daily. What changed:   when to take this  reasons to take this   FreeStyle Freedom Lite w/Device Kit Use to check blood sugar 2 times per day dx code E11.65   freestyle lancets Use as instructed to check blood sugar 2 times per day dx code E11.65   FREESTYLE LITE test strip Generic drug: glucose blood USE THREE TIMES DAILY   gabapentin 100 MG capsule Commonly known as: NEURONTIN Take 100 mg by mouth at bedtime.   hydrocortisone 2.5 % cream Apply 1 application topically 3 (three) times daily as needed (skin irritation (legs & arms)).   insulin aspart 100 UNIT/ML FlexPen Commonly known as: NovoLOG FlexPen INJECT 6-8 UNITS UNDER THE  SKIN THREE TIMES DAILY BEFORE MEALS. DX:E11.65 What changed:   how much to take  how to take this  when to take this  additional instructions   ketoconazole 2 % shampoo Commonly known as: Nizoral Apply 1 application topically 2 (two) times a week. What changed:   when to take this  reasons to take this   levothyroxine 50 MCG tablet Commonly known as: SYNTHROID Take 1 tablet (50 mcg total) by mouth daily. What changed: when to take this   lidocaine 5 % Commonly known as: LIDODERM Place 1 patch onto the skin daily as needed (back pain).   memantine 10 MG tablet Commonly known as: NAMENDA Take 1 tablet (10 mg total) by mouth 2 (two) times daily.   metoprolol tartrate 25 MG tablet Commonly known as: LOPRESSOR Take 0.5 tablets (12.5 mg total) by mouth 2 (two) times daily.   midodrine 10 MG tablet Commonly known as: PROAMATINE Take one pill prior to hemodialysis on MWF What changed:   how much to take  how to take this  when to take this  additional instructions   omeprazole 40 MG  capsule Commonly known as: PRILOSEC Take 1 capsule (40 mg total) by mouth daily. What changed: when to take this   Pen Needles 32G X 4 MM Misc 1 each by Does not apply route 3 (three) times daily. USE PEN NEEDLES TO INJECT INSULIN THREE TIMES DAILY.   sevelamer carbonate 800 MG tablet Commonly known as: RENVELA Take 1,600-2,400 mg by mouth See admin instructions. Take 2,400 mg by mouth three times a day with meals and 1,600 mg with snacks on Sun/Tues/Thurs/Sat and 2,400 mg with breakfast and dinner and nothing with snacks on Mon/Wed/Fri   simvastatin 20 MG tablet Commonly known as: ZOCOR Take 20 mg by mouth at bedtime.   Theratears 0.25 % Soln Generic drug: Carboxymethylcellulose Sodium Place 1 drop into both eyes as needed (for dryness).   Tradjenta 5 MG Tabs tablet Generic drug: linagliptin TAKE 1 TABLET DAILY What changed: how much to take     ASK your doctor about  these medications   HYDROcodone-acetaminophen 5-325 MG tablet Commonly known as: NORCO/VICODIN Take 1-2 tablets by mouth every 6 (six) hours as needed for up to 5 days for moderate pain. Ask about: Should I take this medication?            Discharge Care Instructions  (From admission, onward)         Start     Ordered   11/13/20 0000  No dressing needed        11/13/20 1343   11/10/20 0000  No dressing needed        11/10/20 1524          Allergies  Allergen Reactions  . Ambien [Zolpidem Tartrate] Other (See Comments)    Hallucinations and "felt crazy"   . Penicillins Rash and Hives    Has patient had a PCN reaction causing immediate rash, facial/tongue/throat swelling, SOB or lightheadedness with hypotension: Yes Has patient had a PCN reaction causing severe rash involving mucus membranes or skin necrosis: Yes Has patient had a PCN reaction that required hospitalization: No Has patient had a PCN reaction occurring within the last 10 years: No If all of the above answers are "NO", then may proceed with Cephalosporin use.  Other reaction(s): HIVES    Consultations:  Orthopedics  Nephrology   Procedures/Studies: DG Chest 1 View  Result Date: 11/07/2020 CLINICAL DATA:  Pain following fall EXAM: CHEST  1 VIEW COMPARISON:  May 26, 2020 FINDINGS: There is slight left base atelectasis. Lungs elsewhere clear. Heart size and pulmonary vascularity are within normal limits. There is aortic atherosclerosis. No evident adenopathy. No bone lesions. IMPRESSION: Slight left base atelectasis. Lungs otherwise clear. Heart size normal. Aortic Atherosclerosis (ICD10-I70.0). Electronically Signed   By: Lowella Grip III M.D.   On: 11/07/2020 11:51   CT Head Wo Contrast  Result Date: 11/07/2020 CLINICAL DATA:  Fall. EXAM: CT HEAD WITHOUT CONTRAST CT CERVICAL SPINE WITHOUT CONTRAST TECHNIQUE: Multidetector CT imaging of the head and cervical spine was performed following the  standard protocol without intravenous contrast. Multiplanar CT image reconstructions of the cervical spine were also generated. COMPARISON:  May 26, 2020. FINDINGS: CT HEAD FINDINGS Brain: Stable bilateral frontal chronic hygromas are noted, left greater than right. Mild diffuse cortical atrophy is noted. Ventricular size is within normal limits. No evidence of acute hemorrhage, infarction or intracranial mass lesion is noted. Vascular: No hyperdense vessel or unexpected calcification. Skull: Status post bilateral frontal craniotomies. No acute osseous abnormality is noted. Sinuses/Orbits: No acute finding. Other: None. CT  CERVICAL SPINE FINDINGS Alignment: Normal. Skull base and vertebrae: Old C7 fracture is noted. No acute fracture is noted. Soft tissues and spinal canal: No prevertebral fluid or swelling. No visible canal hematoma. Disc levels: Moderate degenerative disc disease is noted at C3-4, C4-5, C5-6, C6-7 and C7-T1. Upper chest: Negative. Other: None. IMPRESSION: 1. Stable bilateral frontal chronic hygromas are noted, left greater than right. Mild diffuse cortical atrophy. No acute intracranial abnormality seen. 2. Multilevel degenerative disc disease. No acute abnormality seen in the cervical spine. Electronically Signed   By: Marijo Conception M.D.   On: 11/07/2020 12:00   CT Cervical Spine Wo Contrast  Result Date: 11/07/2020 CLINICAL DATA:  Fall. EXAM: CT HEAD WITHOUT CONTRAST CT CERVICAL SPINE WITHOUT CONTRAST TECHNIQUE: Multidetector CT imaging of the head and cervical spine was performed following the standard protocol without intravenous contrast. Multiplanar CT image reconstructions of the cervical spine were also generated. COMPARISON:  May 26, 2020. FINDINGS: CT HEAD FINDINGS Brain: Stable bilateral frontal chronic hygromas are noted, left greater than right. Mild diffuse cortical atrophy is noted. Ventricular size is within normal limits. No evidence of acute hemorrhage, infarction  or intracranial mass lesion is noted. Vascular: No hyperdense vessel or unexpected calcification. Skull: Status post bilateral frontal craniotomies. No acute osseous abnormality is noted. Sinuses/Orbits: No acute finding. Other: None. CT CERVICAL SPINE FINDINGS Alignment: Normal. Skull base and vertebrae: Old C7 fracture is noted. No acute fracture is noted. Soft tissues and spinal canal: No prevertebral fluid or swelling. No visible canal hematoma. Disc levels: Moderate degenerative disc disease is noted at C3-4, C4-5, C5-6, C6-7 and C7-T1. Upper chest: Negative. Other: None. IMPRESSION: 1. Stable bilateral frontal chronic hygromas are noted, left greater than right. Mild diffuse cortical atrophy. No acute intracranial abnormality seen. 2. Multilevel degenerative disc disease. No acute abnormality seen in the cervical spine. Electronically Signed   By: Marijo Conception M.D.   On: 11/07/2020 12:00   DG C-Arm 1-60 Min  Result Date: 11/08/2020 CLINICAL DATA:  Surgery, elective. Additional history provided: Right troch nail. Provided fluoroscopy time 59 seconds (15.02 mGy). EXAM: OPERATIVE right HIP (WITH PELVIS IF PERFORMED) 6 VIEWS TECHNIQUE: Fluoroscopic spot image(s) were submitted for interpretation post-operatively. COMPARISON:  Radiographs of the right hip 11/07/2020. FINDINGS: Six intraoperative fluoroscopic images of the right hip/femur are submitted. The images demonstrate a femoral intramedullary nail with proximal and distal interlocking screws, traversing a known intertrochanteric right hip fracture. Alignment appears near anatomic. IMPRESSION: Six intraoperative fluoroscopic images from right femoral ORIF for an intratrochanteric right hip fracture, as described. Electronically Signed   By: Kellie Simmering DO   On: 11/08/2020 12:51   ECHOCARDIOGRAM COMPLETE  Result Date: 11/07/2020    ECHOCARDIOGRAM REPORT   Patient Name:   YGNACIO FECTEAU Heist Date of Exam: 11/07/2020 Medical Rec #:  [id number]      Height:        71.0 in Accession #:    [id number]     Weight:       164.2 lb Date of Birth:  1941-01-07      BSA:          1.939 m Patient Age:    24 years       BP:           156/59 mmHg Patient Gender: M              HR:           73 bpm. Exam Location:  Inpatient  Procedure: 2D Echo, Cardiac Doppler and Color Doppler Indications:    CHF-Acute Diastolic G95.62  History:        Patient has prior history of Echocardiogram examinations, most                 recent 04/29/2018. CAD, PAD, Signs/Symptoms:Dyspnea; Risk                 Factors:Diabetes, Hypertension, Dyslipidemia and Sleep Apnea.                 Thyroid disease. Renal insufficiency. End stage renal disease.                 Mild dementia. Hip fracture.  Sonographer:    Darlina Sicilian RDCS Referring Phys: [id number] [place]  1. Left ventricular ejection fraction, by estimation, is 55 to 60%. The left ventricle has normal function. The left ventricle has no regional wall motion abnormalities. There is mild left ventricular hypertrophy. Left ventricular diastolic parameters are consistent with Grade I diastolic dysfunction (impaired relaxation).  2. Right ventricular systolic function is normal. The right ventricular size is normal. There is mildly elevated pulmonary artery systolic pressure. The estimated right ventricular systolic pressure is 84.6 mmHg.  3. The mitral valve is normal in structure. Trivial mitral valve regurgitation. No evidence of mitral stenosis.  4. The aortic valve is tricuspid. Aortic valve regurgitation is not visualized. Mild aortic valve sclerosis is present, with no evidence of aortic valve stenosis.  5. The inferior vena cava is normal in size with greater than 50% respiratory variability, suggesting right atrial pressure of 3 mmHg. FINDINGS  Left Ventricle: Left ventricular ejection fraction, by estimation, is 55 to 60%. The left ventricle has normal function. The left ventricle has no regional wall motion abnormalities. The left  ventricular internal cavity size was normal in size. There is  mild left ventricular hypertrophy. Left ventricular diastolic parameters are consistent with Grade I diastolic dysfunction (impaired relaxation). Right Ventricle: The right ventricular size is normal. No increase in right ventricular wall thickness. Right ventricular systolic function is normal. There is mildly elevated pulmonary artery systolic pressure. The tricuspid regurgitant velocity is 3.05  m/s, and with an assumed right atrial pressure of 3 mmHg, the estimated right ventricular systolic pressure is 96.2 mmHg. Left Atrium: Left atrial size was normal in size. Right Atrium: Right atrial size was normal in size. Pericardium: There is no evidence of pericardial effusion. Mitral Valve: The mitral valve is normal in structure. Trivial mitral valve regurgitation. No evidence of mitral valve stenosis. Tricuspid Valve: The tricuspid valve is normal in structure. Tricuspid valve regurgitation is mild. Aortic Valve: The aortic valve is tricuspid. Aortic valve regurgitation is not visualized. Mild aortic valve sclerosis is present, with no evidence of aortic valve stenosis. Pulmonic Valve: The pulmonic valve was normal in structure. Pulmonic valve regurgitation is not visualized. Aorta: The aortic root is normal in size and structure. Venous: The inferior vena cava is normal in size with greater than 50% respiratory variability, suggesting right atrial pressure of 3 mmHg. IAS/Shunts: No atrial level shunt detected by color flow Doppler.  LEFT VENTRICLE PLAX 2D LVIDd:         3.40 cm  Diastology LVIDs:         2.40 cm  LV e' medial:    7.07 cm/s LV PW:         1.20 cm  LV E/e' medial:  13.0 LV IVS:  1.50 cm  LV e' lateral:   8.59 cm/s LVOT diam:     1.90 cm  LV E/e' lateral: 10.7 LV SV:         83 LV SV Index:   43 LVOT Area:     2.84 cm  RIGHT VENTRICLE RV S prime:     17.90 cm/s TAPSE (M-mode): 2.1 cm LEFT ATRIUM             Index       RIGHT ATRIUM           Index LA diam:        2.75 cm 1.42 cm/m  RA Area:     9.54 cm LA Vol (A2C):   33.0 ml 17.02 ml/m RA Volume:   16.20 ml 8.35 ml/m LA Vol (A4C):   46.8 ml 24.13 ml/m LA Biplane Vol: 41.1 ml 21.19 ml/m  AORTIC VALVE LVOT Vmax:   127.00 cm/s LVOT Vmean:  86.800 cm/s LVOT VTI:    0.291 m  AORTA Ao Root diam: 3.60 cm Ao Asc diam:  3.40 cm MITRAL VALVE                TRICUSPID VALVE MV Area (PHT): 4.41 cm     TR Peak grad:   37.2 mmHg MV Decel Time: 172 msec     TR Vmax:        305.00 cm/s MV E velocity: 91.80 cm/s MV A velocity: 122.00 cm/s  SHUNTS MV E/A ratio:  0.75         Systemic VTI:  0.29 m                             Systemic Diam: 1.90 cm Loralie Champagne MD Electronically signed by Loralie Champagne MD Signature Date/Time: 11/07/2020/5:40:54 PM    Final    DG HIP OPERATIVE UNILAT W OR W/O PELVIS RIGHT  Result Date: 11/08/2020 CLINICAL DATA:  Surgery, elective. Additional history provided: Right troch nail. Provided fluoroscopy time 59 seconds (15.02 mGy). EXAM: OPERATIVE right HIP (WITH PELVIS IF PERFORMED) 6 VIEWS TECHNIQUE: Fluoroscopic spot image(s) were submitted for interpretation post-operatively. COMPARISON:  Radiographs of the right hip 11/07/2020. FINDINGS: Six intraoperative fluoroscopic images of the right hip/femur are submitted. The images demonstrate a femoral intramedullary nail with proximal and distal interlocking screws, traversing a known intertrochanteric right hip fracture. Alignment appears near anatomic. IMPRESSION: Six intraoperative fluoroscopic images from right femoral ORIF for an intratrochanteric right hip fracture, as described. Electronically Signed   By: Kellie Simmering DO   On: 11/08/2020 12:51   DG Hip Unilat W or Wo Pelvis 2-3 Views Right  Result Date: 11/07/2020 CLINICAL DATA:  Pain following fall EXAM: DG HIP (WITH OR WITHOUT PELVIS) 2-3V RIGHT COMPARISON:  None. FINDINGS: Frontal pelvis as well as frontal and lateral right hip images were obtained. There is an  intertrochanteric femur fracture on the right with slight impaction at the fracture site. No other fracture. No dislocation. There is severe narrowing of the right hip joint with moderate narrowing of the left hip joint. No erosion. There is extensive distal aortic and iliac artery atherosclerotic calcification. There is also common femoral and superficial femoral artery atherosclerotic calcification bilaterally. There are surgical clips in each inguinal region. IMPRESSION: Intertrochanteric femur fracture on the right with mild impaction at the fracture site. No other acute fracture. No dislocation. 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 There is multifocal atherosclerotic arterial vascular calcification. Electronically Signed   By: Lowella Grip III M.D.   On: 11/07/2020 11:47   (Echo, Carotid, EGD, Colonoscopy, ERCP)    Subjective: Patient seen and examined.  No overnight events.  Pleasant.  Poor historian.  Does complain of some pain on his right leg and was asking why he is not able to walk well.   Discharge Exam: Vitals:   11/16/20 1025 11/16/20 1113  BP: (!) 131/51 (!) 137/50  Pulse: 74 78  Resp: 15   Temp: 98.2 F (36.8 C) 97.8 F (36.6 C)  SpO2: 100% 100%   Vitals:   11/16/20 0930 11/16/20 1000 11/16/20 1025 11/16/20 1113  BP: (!) 129/47 (!) 120/45 (!) 131/51 (!) 137/50  Pulse: 71 72 74 78  Resp: 13 13 15    Temp:   98.2 F (36.8 C) 97.8 F (36.6 C)  TempSrc:   Oral Oral  SpO2:   100% 100%  Weight:      Height:        General: Pt is alert, awake, not in acute distress Patient is alert awake and oriented x1.  Is pleasantly confused due to his dementia. Cardiovascular: RRR, S1/S2 +, no rubs, no gallops Respiratory: CTA bilaterally, no wheezing, no rhonchi Abdominal: Soft, NT, ND, bowel sounds + Extremities:  Right upper extremity AV fistula present with thrill. Right thigh incision clean and dry.    The  results of significant diagnostics from this hospitalization (including imaging, microbiology, ancillary and laboratory) are listed below for reference.     Microbiology: Recent Results (from the past 240 hour(s))  Resp Panel by RT-PCR (Flu A&B, Covid) Nasopharyngeal Swab     Status: None   Collection Time: 11/07/20  1:38 PM   Specimen: Nasopharyngeal Swab; Nasopharyngeal(NP) swabs in vial transport medium  Result Value Ref Range Status   SARS Coronavirus 2 by RT PCR NEGATIVE NEGATIVE Final    Comment: (NOTE) SARS-CoV-2 target nucleic acids are NOT DETECTED.  The SARS-CoV-2 RNA is generally detectable in upper respiratory specimens during the acute phase of infection. The lowest concentration of SARS-CoV-2 viral copies this assay can detect is 138 copies/mL. A negative result does not preclude SARS-Cov-2 infection and should not be used as the sole basis for treatment or other patient management decisions. A negative result may occur with  improper specimen collection/handling, submission of specimen other than nasopharyngeal swab, presence of viral mutation(s) within the areas targeted by this assay, and inadequate number of viral copies(<138 copies/mL). A negative result must be combined with clinical observations, patient history, and epidemiological information. The expected result is Negative.  Fact Sheet for Patients:  EntrepreneurPulse.com.au  Fact Sheet for Healthcare Providers:  IncredibleEmployment.be  This test is no t yet approved or cleared by the [place] FDA and  has been authorized for detection and/or diagnosis of SARS-CoV-2 by FDA under an Emergency Use Authorization (EUA). This EUA will remain  in effect (meaning this test can be used) for the duration of the COVID-19 declaration under Section 564(b)(1) of the Act, 21 U.S.C.section 360bbb-3(b)(1), unless the authorization is terminated  or revoked sooner.        Influenza A by PCR NEGATIVE NEGATIVE Final   Influenza B by PCR NEGATIVE NEGATIVE Final    Comment: (NOTE) The Xpert Xpress SARS-CoV-2/FLU/RSV plus assay is intended as an aid in the diagnosis of influenza from Nasopharyngeal swab specimens and should not be used as a sole basis for treatment.  Nasal washings and aspirates are unacceptable for Xpert Xpress SARS-CoV-2/FLU/RSV testing.  Fact Sheet for Patients: EntrepreneurPulse.com.au  Fact Sheet for Healthcare Providers: IncredibleEmployment.be  This test is not yet approved or cleared by the [place] FDA and has been authorized for detection and/or diagnosis of SARS-CoV-2 by FDA under an Emergency Use Authorization (EUA). This EUA will remain in effect (meaning this test can be used) for the duration of the COVID-19 declaration under Section 564(b)(1) of the Act, 21 U.S.C. section 360bbb-3(b)(1), unless the authorization is terminated or revoked.  Performed at [facility], [place] [address] [place], [place] [address]   Surgical pcr screen     Status: None   Collection Time: 11/08/20 12:13 AM   Specimen: Nasal Mucosa; Nasal Swab  Result Value Ref Range Status   MRSA, PCR NEGATIVE NEGATIVE Final   Staphylococcus aureus NEGATIVE NEGATIVE Final    Comment: (NOTE) The Xpert SA Assay (FDA approved for NASAL specimens in patients 83 years of age and older), is one component of a comprehensive surveillance program. It is not intended to diagnose infection nor to guide or monitor treatment. Performed at [facility], [place] [address] [place], [place] [address]   SARS CORONAVIRUS 2 (TAT 6-24 HRS) Nasopharyngeal Nasopharyngeal Swab     Status: None   Collection Time: 11/13/20 10:53 AM   Specimen: Nasopharyngeal Swab  Result Value Ref Range Status   SARS Coronavirus 2 NEGATIVE NEGATIVE Final    Comment: (NOTE) SARS-CoV-2 target nucleic acids are NOT DETECTED.  The SARS-CoV-2 RNA is  generally detectable in upper and lower respiratory specimens during the acute phase of infection. Negative results do not preclude SARS-CoV-2 infection, do not rule out co-infections with other pathogens, and should not be used as the sole basis for treatment or other patient management decisions. Negative results must be combined with clinical observations, patient history, and epidemiological information. The expected result is Negative.  Fact Sheet for Patients: SugarRoll.be  Fact Sheet for Healthcare Providers: https://www.woods-mathews.com/  This test is not yet approved or cleared by the [place] FDA and  has been authorized for detection and/or diagnosis of SARS-CoV-2 by FDA under an Emergency Use Authorization (EUA). This EUA will remain  in effect (meaning this test can be used) for the duration of the COVID-19 declaration under Se ction 564(b)(1) of the Act, 21 U.S.C. section 360bbb-3(b)(1), unless the authorization is terminated or revoked sooner.  Performed at [facility], [place] [address], [place], [place] [address]      Labs: BNP (last 3 results) Recent Labs    11/07/20 1600  BNP 053.9*   Basic Metabolic Panel: Recent Labs  Lab 11/09/20 1235 11/11/20 0826 11/15/20 0411 11/16/20 0700  NA 137 139  --  136  K 4.5 3.8  --  3.9  CL 98 97*  --  96*  CO2 26 28  --  28  GLUCOSE 98 147*  --  110*  BUN 39* 44*  --  52*  CREATININE 7.83* 8.21* 6.49* 8.95*  CALCIUM 9.2 9.4  --  10.0  PHOS 5.4* 3.5  --  4.1   Liver Function Tests: Recent Labs  Lab 11/09/20 1235 11/11/20 0826 11/16/20 0700  ALBUMIN 2.2* 2.1* 2.1*   No results for input(s): LIPASE, AMYLASE in the last 168 hours. No results for input(s): AMMONIA in the last 168 hours. CBC: Recent Labs  Lab 11/10/20 0119 11/11/20 0125 11/16/20 0627  WBC 11.1* 10.1 9.8  HGB 8.2* 7.8*  7.0*  HCT 23.8* 23.9* 22.0*  MCV 101.3* 101.7* 104.8*  PLT 191 199  301   Cardiac Enzymes: No results for input(s): CKTOTAL, CKMB, CKMBINDEX, TROPONINI in the last 168 hours. BNP: Invalid input(s): POCBNP CBG: Recent Labs  Lab 11/15/20 0727 11/15/20 1156 11/15/20 1610 11/15/20 2045 11/16/20 0641  GLUCAP 120* 114* 139* 127* 103*   D-Dimer No results for input(s): DDIMER in the last 72 hours. Hgb A1c No results for input(s): HGBA1C in the last 72 hours. Lipid Profile No results for input(s): CHOL, HDL, LDLCALC, TRIG, CHOLHDL, LDLDIRECT in the last 72 hours. Thyroid function studies No results for input(s): TSH, T4TOTAL, T3FREE, THYROIDAB in the last 72 hours.  Invalid input(s): FREET3 Anemia work up No results for input(s): VITAMINB12, FOLATE, FERRITIN, TIBC, IRON, RETICCTPCT in the last 72 hours. Urinalysis    Component Value Date/Time   COLORURINE YELLOW 07/21/2018 1044   APPEARANCEUR CLOUDY (A) 07/21/2018 1044   LABSPEC 1.009 07/21/2018 1044   PHURINE 9.0 (H) 07/21/2018 1044   GLUCOSEU NEGATIVE 07/21/2018 1044   HGBUR NEGATIVE 07/21/2018 1044   BILIRUBINUR NEGATIVE 07/21/2018 1044   BILIRUBINUR neg 02/14/2015 1115   KETONESUR NEGATIVE 07/21/2018 1044   PROTEINUR 100 (A) 07/21/2018 1044   UROBILINOGEN 0.2 02/20/2015 0944   NITRITE NEGATIVE 07/21/2018 1044   LEUKOCYTESUR SMALL (A) 07/21/2018 1044   Sepsis Labs Invalid input(s): PROCALCITONIN,  WBC,  LACTICIDVEN Microbiology Recent Results (from the past 240 hour(s))  Resp Panel by RT-PCR (Flu A&B, Covid) Nasopharyngeal Swab     Status: None   Collection Time: 11/07/20  1:38 PM   Specimen: Nasopharyngeal Swab; Nasopharyngeal(NP) swabs in vial transport medium  Result Value Ref Range Status   SARS Coronavirus 2 by RT PCR NEGATIVE NEGATIVE Final    Comment: (NOTE) SARS-CoV-2 target nucleic acids are NOT DETECTED.  The SARS-CoV-2 RNA is generally detectable in upper respiratory specimens during the acute phase of infection. The lowest concentration of SARS-CoV-2 viral copies this  assay can detect is 138 copies/mL. A negative result does not preclude SARS-Cov-2 infection and should not be used as the sole basis for treatment or other patient management decisions. A negative result may occur with  improper specimen collection/handling, submission of specimen other than nasopharyngeal swab, presence of viral mutation(s) within the areas targeted by this assay, and inadequate number of viral copies(<138 copies/mL). A negative result must be combined with clinical observations, patient history, and epidemiological information. The expected result is Negative.  Fact Sheet for Patients:  EntrepreneurPulse.com.au  Fact Sheet for Healthcare Providers:  IncredibleEmployment.be  This test is no t yet approved or cleared by the [place] FDA and  has been authorized for detection and/or diagnosis of SARS-CoV-2 by FDA under an Emergency Use Authorization (EUA). This EUA will remain  in effect (meaning this test can be used) for the duration of the COVID-19 declaration under Section 564(b)(1) of the Act, 21 U.S.C.section 360bbb-3(b)(1), unless the authorization is terminated  or revoked sooner.       Influenza A by PCR NEGATIVE NEGATIVE Final   Influenza B by PCR NEGATIVE NEGATIVE Final    Comment: (NOTE) The Xpert Xpress SARS-CoV-2/FLU/RSV plus assay is intended as an aid in the diagnosis of influenza from Nasopharyngeal swab specimens and should not be used as a sole basis for treatment. Nasal washings and aspirates are unacceptable for Xpert Xpress SARS-CoV-2/FLU/RSV testing.  Fact Sheet for Patients: EntrepreneurPulse.com.au  Fact Sheet for Healthcare Providers: IncredibleEmployment.be  This test is not yet approved or cleared  by the [place] and has been authorized for detection and/or diagnosis of SARS-CoV-2 by FDA under an Emergency Use Authorization (EUA). This EUA will  remain in effect (meaning this test can be used) for the duration of the COVID-19 declaration under Section 564(b)(1) of the Act, 21 U.S.C. section 360bbb-3(b)(1), unless the authorization is terminated or revoked.  Performed at [facility], [place] [address] [place], [place] [address]   Surgical pcr screen     Status: None   Collection Time: 11/08/20 12:13 AM   Specimen: Nasal Mucosa; Nasal Swab  Result Value Ref Range Status   MRSA, PCR NEGATIVE NEGATIVE Final   Staphylococcus aureus NEGATIVE NEGATIVE Final    Comment: (NOTE) The Xpert SA Assay (FDA approved for NASAL specimens in patients 4 years of age and older), is one component of a comprehensive surveillance program. It is not intended to diagnose infection nor to guide or monitor treatment. Performed at [facility], [place] [address], [place], [place] [address]   SARS CORONAVIRUS 2 (TAT 6-24 HRS) Nasopharyngeal Nasopharyngeal Swab     Status: None   Collection Time: 11/13/20 10:53 AM   Specimen: Nasopharyngeal Swab  Result Value Ref Range Status   SARS Coronavirus 2 NEGATIVE NEGATIVE Final    Comment: (NOTE) SARS-CoV-2 target nucleic acids are NOT DETECTED.  The SARS-CoV-2 RNA is generally detectable in upper and lower respiratory specimens during the acute phase of infection. Negative results do not preclude SARS-CoV-2 infection, do not rule out co-infections with other pathogens, and should not be used as the sole basis for treatment or other patient management decisions. Negative results must be combined with clinical observations, patient history, and epidemiological information. The expected result is Negative.  Fact Sheet for Patients: SugarRoll.be  Fact Sheet for Healthcare Providers: https://www.woods-mathews.com/  This test is not yet approved or cleared by the [place] FDA and  has been authorized for detection and/or diagnosis of SARS-CoV-2  by FDA under an Emergency Use Authorization (EUA). This EUA will remain  in effect (meaning this test can be used) for the duration of the COVID-19 declaration under Se ction 564(b)(1) of the Act, 21 U.S.C. section 360bbb-3(b)(1), unless the authorization is terminated or revoked sooner.  Performed at [facility], [place] [address], [place],  [address]      Time coordinating discharge:  35 minutes  SIGNED:   Barb Merino, MD  [facility] 11/16/2020, 11:19 AM

## 2020-11-16 NOTE — Progress Notes (Signed)
[facility] Progress Note   Subjective:     [name] was seen and examined today at bedside. Patient is s/p R intertrochanteric femur fracture. No issues/concerns. Denies pain at this time. He is scheduled to be discharged today.  Objective Vitals:   11/16/20 1000 11/16/20 1025 11/16/20 1113 11/16/20 1553  BP: (!) 120/45 (!) 131/51 (!) 137/50 (!) 121/39  Pulse: 72 74 78 76  Resp: 13 15    Temp:  98.2 F (36.8 C) 97.8 F (36.6 C) (!) 97.5 F (36.4 C)  TempSrc:  Oral Oral Oral  SpO2:  100% 100% 100%  Weight:      Height:       Physical Exam General:Appears comfortable; No evidence of acute respiratory distress Heart:Normal S1 and S2; No murmurs, gallops, friction rub Lungs:Clear throughout w/o evidence of rales, wheezing, and rhonchi Abdomen:Large, soft, non-tender, non-distended Extremities:No edema of bilateral upper extremities, hips, and lower extremities. Skin: 2X2 dressing intact to R hip; skin warm and dry Dialysis Access:R AVF (+) Bruit/Thrill  Filed Weights   11/11/20 1047 11/13/20 1126 11/16/20 0710  Weight: 73.6 kg 73.6 kg 74.6 kg    Intake/Output Summary (Last 24 hours) at 11/16/2020 1558 Last data filed at 11/16/2020 1025 Gross per 24 hour  Intake -  Output 1001 ml  Net -1001 ml    Additional Objective Labs: Basic Metabolic Panel: Recent Labs  Lab 11/11/20 0826 11/15/20 0411 11/16/20 0700  NA 139  --  136  K 3.8  --  3.9  CL 97*  --  96*  CO2 28  --  28  GLUCOSE 147*  --  110*  BUN 44*  --  52*  CREATININE 8.21* 6.49* 8.95*  CALCIUM 9.4  --  10.0  PHOS 3.5  --  4.1   Liver Function Tests: Recent Labs  Lab 11/11/20 0826 11/16/20 0700  ALBUMIN 2.1* 2.1*   CBC: Recent Labs  Lab 11/10/20 0119 11/11/20 0125 11/16/20 0627  WBC 11.1* 10.1 9.8  HGB 8.2* 7.8* 7.0*  HCT 23.8* 23.9* 22.0*  MCV 101.3* 101.7* 104.8*  PLT 191 199 301   Blood Culture    Component Value Date/Time   SDES WOUND RIGHT ARTERIAL FISTULA  09/01/2019 1305   SPECREQUEST NONE 09/01/2019 1305   CULT  09/01/2019 1305    FEW STAPHYLOCOCCUS AUREUS NO ANAEROBES ISOLATED Performed at [facility], [place] [address] [place], [place] [address]    REPTSTATUS 09/06/2019 FINAL 09/01/2019 1305    CBG: Recent Labs  Lab 11/15/20 1156 11/15/20 1610 11/15/20 2045 11/16/20 0641 11/16/20 1114  GLUCAP 114* 139* 127* 103* 115*   Iron Studies: No results for input(s): IRON, TIBC, TRANSFERRIN, FERRITIN in the last 72 hours. Lab Results  Component Value Date   INR 0.97 07/22/2018   INR 1.01 06/05/2016    Medications:  . allopurinol  100 mg Oral Daily  . Chlorhexidine Gluconate Cloth  6 each Topical Q0600  . Chlorhexidine Gluconate Cloth  6 each Topical Q0600  . darbepoetin (ARANESP) injection - DIALYSIS  100 mcg Intravenous Q Fri-HD  . docusate sodium  100 mg Oral BID  . donepezil  10 mg Oral QHS  . feeding supplement (NEPRO CARB STEADY)  237 mL Oral TID WC  . fluticasone  1 spray Each Nare Daily  . gabapentin  100 mg Oral QHS  . heparin injection (subcutaneous)  5,000 Units Subcutaneous Q8H  . insulin aspart  0-6 Units Subcutaneous TID WC  . levothyroxine  50  mcg Oral QAC breakfast  . linagliptin  5 mg Oral Daily  . loratadine  10 mg Oral Daily  . memantine  10 mg Oral BID  . metoprolol tartrate  12.5 mg Oral BID  . midodrine  10 mg Oral Q M,W,F-HD  . multivitamin  1 tablet Oral QHS  . pantoprazole  40 mg Oral Daily  . sevelamer carbonate  2,400 mg Oral 3 times per day on Sun Tue Thu Sat  . sevelamer carbonate  2,400 mg Oral 2 times per day on Mon Wed Fri  . simvastatin  20 mg Oral QHS    Dialysis Orders: 3hrs, BFR400, DFR500, EDW 74kg,3K/2.25Ca Access:RU AVF Heparinnone Mircera48mcg q2wks - last 156mcg on 10/28/20  Assessment/Plan: 1. R femur Frx -s/p cephalomedullary nailing of R intertrochanteric hip Frx on 2/15 by Dr. Lucia Gaskins.  2. ESRD- On HD MWF. Received HD today. UF IL  today. 3. Hypertension/volume- Blood pressurein goal.Continue home meds.On midodrine with HD.Does not appear volume overloaded. UF as tolerated.  4. 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 Will continue to monitor Hgb trends in outpatient HD. 5. Secondary Hyperparathyroidism -Corrected Ca high. Not on VDRA, use low Ca bath. Phos in goal, continue Renvela.  6. Nutrition- Currently on carb modified diet w/fluid restrictions.Protein supplements. Renal vitamin.  7. DM - per primary 8. Hypothyroidism- on synthroid.  9. DOE - eval by cardiology. Echo with EF 50-55%, G1DD. Plan for OP follow up. 10. Dispo: Scheduled for discharge to SNF today  Tobie Poet, NP [place] 11/16/2020,3:58 PM  LOS: 9 days

## 2020-11-16 NOTE — Care Management Important Message (Signed)
Important Message  Patient Details  Name: [name] MRN: [id number] Date of Birth: 1941/01/26   Medicare Important Message Given:  Yes - Important Message mailed due to current National Emergency   Verbal consent obtained due to current National Emergency  Relationship to patient: Self Contact Name: [name] Call Date: 11/16/20  Time: 1437 Phone: [phone] Outcome: No Answer/Busy Important Message mailed to: Patient address on file    [name] 11/16/2020, 2:37 PM

## 2020-11-16 NOTE — TOC Transition Note (Signed)
Transition of Care [facility]) - CM/SW Discharge Note   Patient Details  Name: [name] MRN: [id number] Date of Birth: 04/01/1941  Transition of Care [facility] CM/SW Contact:  Sharin Mons, RN Phone Number: 11/16/2020, 11:49 AM   Clinical Narrative:    Patient will DC to: [place] date: 11/16/2020 Family notified: yes, wife Transport by: [employer]  SNF bed offer extended from [facility] and wife accepted.  Per MD patient ready for DC today. RN, patient, patient's family, and facility notified of DC. Pt with HD sessions MWF, [facility] 11:50 am. Discharge Summary and FL2 sent to facility. RN to call report prior to discharge [phone]). Rm#   DC packet on chart. Ambulance transport/ [facility] [phone]) requested for patient.     Pellegrino Kennard (wife)  [phone]  RNCM will sign off for now as intervention is no longer needed. Please consult [place] again if new needs arise.    Final next level of care: [place] Barriers to Discharge: No Barriers Identified   Patient Goals and CMS Choice Patient states their goals for this hospitalization and ongoing recovery are:: to get better CMS Medicare.gov Compare Post Acute Care list provided to:: Patient Represenative (must comment)    Discharge Placement                       Discharge Plan and Services   Discharge Planning Services: CM Consult                                 Social Determinants of Health (SDOH) Interventions     Readmission Risk Interventions No flowsheet data found.

## 2020-11-17 ENCOUNTER — Ambulatory Visit: Payer: Medicare Other | Admitting: Endocrinology

## 2020-11-17 DIAGNOSIS — D638 Anemia in other chronic diseases classified elsewhere: Secondary | ICD-10-CM | POA: Diagnosis not present

## 2020-11-17 DIAGNOSIS — I150 Renovascular hypertension: Secondary | ICD-10-CM | POA: Diagnosis not present

## 2020-11-17 DIAGNOSIS — N186 End stage renal disease: Secondary | ICD-10-CM | POA: Diagnosis not present

## 2020-11-17 DIAGNOSIS — F0280 Dementia in other diseases classified elsewhere without behavioral disturbance: Secondary | ICD-10-CM | POA: Diagnosis not present

## 2020-11-17 DIAGNOSIS — E7849 Other hyperlipidemia: Secondary | ICD-10-CM | POA: Diagnosis not present

## 2020-11-17 DIAGNOSIS — S7291XA Unspecified fracture of right femur, initial encounter for closed fracture: Secondary | ICD-10-CM | POA: Diagnosis not present

## 2020-11-17 DIAGNOSIS — I471 Supraventricular tachycardia: Secondary | ICD-10-CM | POA: Diagnosis not present

## 2020-11-17 DIAGNOSIS — Z9889 Other specified postprocedural states: Secondary | ICD-10-CM | POA: Diagnosis not present

## 2020-11-17 DIAGNOSIS — R5383 Other fatigue: Secondary | ICD-10-CM | POA: Diagnosis not present

## 2020-11-17 DIAGNOSIS — D539 Nutritional anemia, unspecified: Secondary | ICD-10-CM | POA: Diagnosis not present

## 2020-11-17 DIAGNOSIS — E1149 Type 2 diabetes mellitus with other diabetic neurological complication: Secondary | ICD-10-CM | POA: Diagnosis not present

## 2020-11-17 DIAGNOSIS — Z992 Dependence on renal dialysis: Secondary | ICD-10-CM | POA: Diagnosis not present

## 2020-11-18 DIAGNOSIS — Z992 Dependence on renal dialysis: Secondary | ICD-10-CM | POA: Diagnosis not present

## 2020-11-18 DIAGNOSIS — D631 Anemia in chronic kidney disease: Secondary | ICD-10-CM | POA: Diagnosis not present

## 2020-11-18 DIAGNOSIS — E119 Type 2 diabetes mellitus without complications: Secondary | ICD-10-CM | POA: Diagnosis not present

## 2020-11-18 DIAGNOSIS — E876 Hypokalemia: Secondary | ICD-10-CM | POA: Diagnosis not present

## 2020-11-18 DIAGNOSIS — N186 End stage renal disease: Secondary | ICD-10-CM | POA: Diagnosis not present

## 2020-11-21 ENCOUNTER — Telehealth: Payer: Self-pay | Admitting: Neurology

## 2020-11-21 DIAGNOSIS — D631 Anemia in chronic kidney disease: Secondary | ICD-10-CM | POA: Diagnosis not present

## 2020-11-21 DIAGNOSIS — N186 End stage renal disease: Secondary | ICD-10-CM | POA: Diagnosis not present

## 2020-11-21 DIAGNOSIS — E119 Type 2 diabetes mellitus without complications: Secondary | ICD-10-CM | POA: Diagnosis not present

## 2020-11-21 DIAGNOSIS — E876 Hypokalemia: Secondary | ICD-10-CM | POA: Diagnosis not present

## 2020-11-21 DIAGNOSIS — Z992 Dependence on renal dialysis: Secondary | ICD-10-CM | POA: Diagnosis not present

## 2020-11-21 NOTE — Telephone Encounter (Signed)
Patient won't have formal memory testing feedback until early April. Dr Jaynee Eagles would have him wait until that is completed to have this follow up appointment as it was scheduled to be as a review of pet scan results and the appts that pt had with Dr Sima Matas.

## 2020-11-21 NOTE — Telephone Encounter (Signed)
Looks like he was discharged from the hospital on 11/16/2020.

## 2020-11-21 NOTE — Telephone Encounter (Signed)
Spoke with pt's wife, [name], and was able to schedule the patient for a follow-up on Thursday April 28th at 3:30 pm arrival 3:00. I gave her our sympathies for pt's difficult time he's going through. She verbalized appreciation.

## 2020-11-21 NOTE — Telephone Encounter (Signed)
[name], Chrisean Kloth (on [place]) called, he has fractured his femur and in rehab for 20 days. He is experiencing sundowning, doing the day he's disorientated and do not know where he is. Do [name] want reschedule under these conditions or have rehab bring him there and I will meet him there? Would like a call from the nurse.

## 2020-11-22 ENCOUNTER — Other Ambulatory Visit: Payer: Self-pay

## 2020-11-22 DIAGNOSIS — Z992 Dependence on renal dialysis: Secondary | ICD-10-CM | POA: Diagnosis not present

## 2020-11-22 DIAGNOSIS — D7589 Other specified diseases of blood and blood-forming organs: Secondary | ICD-10-CM | POA: Insufficient documentation

## 2020-11-22 DIAGNOSIS — I739 Peripheral vascular disease, unspecified: Secondary | ICD-10-CM

## 2020-11-22 DIAGNOSIS — E669 Obesity, unspecified: Secondary | ICD-10-CM | POA: Insufficient documentation

## 2020-11-22 DIAGNOSIS — L538 Other specified erythematous conditions: Secondary | ICD-10-CM | POA: Insufficient documentation

## 2020-11-22 DIAGNOSIS — Z87898 Personal history of other specified conditions: Secondary | ICD-10-CM | POA: Diagnosis not present

## 2020-11-22 DIAGNOSIS — L603 Nail dystrophy: Secondary | ICD-10-CM | POA: Insufficient documentation

## 2020-11-22 DIAGNOSIS — E1122 Type 2 diabetes mellitus with diabetic chronic kidney disease: Secondary | ICD-10-CM | POA: Diagnosis not present

## 2020-11-22 DIAGNOSIS — L89152 Pressure ulcer of sacral region, stage 2: Secondary | ICD-10-CM | POA: Diagnosis not present

## 2020-11-22 DIAGNOSIS — N186 End stage renal disease: Secondary | ICD-10-CM | POA: Diagnosis not present

## 2020-11-22 DIAGNOSIS — B353 Tinea pedis: Secondary | ICD-10-CM | POA: Insufficient documentation

## 2020-11-22 DIAGNOSIS — H2510 Age-related nuclear cataract, unspecified eye: Secondary | ICD-10-CM | POA: Insufficient documentation

## 2020-11-22 DIAGNOSIS — I150 Renovascular hypertension: Secondary | ICD-10-CM | POA: Diagnosis not present

## 2020-11-22 DIAGNOSIS — D638 Anemia in other chronic diseases classified elsewhere: Secondary | ICD-10-CM | POA: Diagnosis not present

## 2020-11-22 DIAGNOSIS — E1149 Type 2 diabetes mellitus with other diabetic neurological complication: Secondary | ICD-10-CM | POA: Diagnosis not present

## 2020-11-22 DIAGNOSIS — Z9889 Other specified postprocedural states: Secondary | ICD-10-CM | POA: Diagnosis not present

## 2020-11-22 DIAGNOSIS — L738 Other specified follicular disorders: Secondary | ICD-10-CM | POA: Insufficient documentation

## 2020-11-22 DIAGNOSIS — M810 Age-related osteoporosis without current pathological fracture: Secondary | ICD-10-CM | POA: Insufficient documentation

## 2020-11-23 DIAGNOSIS — N186 End stage renal disease: Secondary | ICD-10-CM | POA: Diagnosis not present

## 2020-11-23 DIAGNOSIS — E119 Type 2 diabetes mellitus without complications: Secondary | ICD-10-CM | POA: Diagnosis not present

## 2020-11-23 DIAGNOSIS — E876 Hypokalemia: Secondary | ICD-10-CM | POA: Diagnosis not present

## 2020-11-23 DIAGNOSIS — Z992 Dependence on renal dialysis: Secondary | ICD-10-CM | POA: Diagnosis not present

## 2020-11-23 DIAGNOSIS — N2581 Secondary hyperparathyroidism of renal origin: Secondary | ICD-10-CM | POA: Diagnosis not present

## 2020-11-24 ENCOUNTER — Other Ambulatory Visit: Payer: Self-pay

## 2020-11-24 ENCOUNTER — Encounter: Payer: No Typology Code available for payment source | Attending: Psychology | Admitting: Psychology

## 2020-11-24 ENCOUNTER — Ambulatory Visit: Payer: Medicare Other | Admitting: Neurology

## 2020-11-24 ENCOUNTER — Encounter: Payer: Self-pay | Admitting: Psychology

## 2020-11-24 DIAGNOSIS — F0390 Unspecified dementia without behavioral disturbance: Secondary | ICD-10-CM | POA: Diagnosis not present

## 2020-11-24 NOTE — Progress Notes (Signed)
   Neuropsychology Note [name] completed 120 minutes of neuropsychological testing with this provider Health and safety inspector). The patient did not appear overtly distressed by the testing session, per behavioral observation or via self-report. Rest breaks were offered.   Tests Administered: Repeatable Battery for the Assessment of Neuropsychological Status, Form A (RBANS-A)  Results:  To be included once scoring is complete.  Feedback to Patient:  [name] will return on 12/27/20 for an interactive feedback session with Dr. Sima Matas at which time his test performances, clinical impressions and treatment recommendations will be reviewed in detail. The patient understands he can contact our office should he require our assistance before this time.  Full report to follow.

## 2020-11-25 DIAGNOSIS — E876 Hypokalemia: Secondary | ICD-10-CM | POA: Diagnosis not present

## 2020-11-25 DIAGNOSIS — Z992 Dependence on renal dialysis: Secondary | ICD-10-CM | POA: Diagnosis not present

## 2020-11-25 DIAGNOSIS — N2581 Secondary hyperparathyroidism of renal origin: Secondary | ICD-10-CM | POA: Diagnosis not present

## 2020-11-25 DIAGNOSIS — E119 Type 2 diabetes mellitus without complications: Secondary | ICD-10-CM | POA: Diagnosis not present

## 2020-11-25 DIAGNOSIS — N186 End stage renal disease: Secondary | ICD-10-CM | POA: Diagnosis not present

## 2020-11-28 DIAGNOSIS — Z992 Dependence on renal dialysis: Secondary | ICD-10-CM | POA: Diagnosis not present

## 2020-11-28 DIAGNOSIS — E119 Type 2 diabetes mellitus without complications: Secondary | ICD-10-CM | POA: Diagnosis not present

## 2020-11-28 DIAGNOSIS — N186 End stage renal disease: Secondary | ICD-10-CM | POA: Diagnosis not present

## 2020-11-28 DIAGNOSIS — N2581 Secondary hyperparathyroidism of renal origin: Secondary | ICD-10-CM | POA: Diagnosis not present

## 2020-11-28 DIAGNOSIS — E876 Hypokalemia: Secondary | ICD-10-CM | POA: Diagnosis not present

## 2020-11-29 ENCOUNTER — Ambulatory Visit: Payer: Medicare Other | Admitting: Orthopaedic Surgery

## 2020-11-30 DIAGNOSIS — E119 Type 2 diabetes mellitus without complications: Secondary | ICD-10-CM | POA: Diagnosis not present

## 2020-11-30 DIAGNOSIS — E876 Hypokalemia: Secondary | ICD-10-CM | POA: Diagnosis not present

## 2020-11-30 DIAGNOSIS — N186 End stage renal disease: Secondary | ICD-10-CM | POA: Diagnosis not present

## 2020-11-30 DIAGNOSIS — Z992 Dependence on renal dialysis: Secondary | ICD-10-CM | POA: Diagnosis not present

## 2020-11-30 DIAGNOSIS — N2581 Secondary hyperparathyroidism of renal origin: Secondary | ICD-10-CM | POA: Diagnosis not present

## 2020-12-01 ENCOUNTER — Other Ambulatory Visit: Payer: Self-pay

## 2020-12-01 ENCOUNTER — Ambulatory Visit (INDEPENDENT_AMBULATORY_CARE_PROVIDER_SITE_OTHER): Payer: Medicare Other | Admitting: Physician Assistant

## 2020-12-01 ENCOUNTER — Ambulatory Visit (HOSPITAL_COMMUNITY)
Admission: RE | Admit: 2020-12-01 | Discharge: 2020-12-01 | Disposition: A | Discharge status: Home / Self Care | Payer: No Typology Code available for payment source | Source: Ambulatory Visit | Attending: Vascular Surgery | Admitting: Vascular Surgery

## 2020-12-01 VITALS — BP 125/50 | HR 58 | Temp 98.2°F | Resp 20 | Ht 70.0 in

## 2020-12-01 DIAGNOSIS — I739 Peripheral vascular disease, unspecified: Secondary | ICD-10-CM | POA: Diagnosis not present

## 2020-12-01 NOTE — Progress Notes (Signed)
Office Note     CC:  follow up Requesting Provider:  Elby Showers, MD  HPI: [name] is a 80 y.o. ([birth year]) male presents for routine follow-up. He had an aortobifemoral bypass in the remote past by Dr. Amedeo Plenty.  We have followed him intermittently for his peripheral arterial disease.  He does not currently have any claudication symptoms; however, he is currently in rehab after femur fracture and operative repair and using a wheelchair. No rest pain. His wife accompanies him today.  Compliant wth statin. No aspirin due to past history of GIB. 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 DM-insulin requiring Quit smoking in 1996  Past Medical History:  Diagnosis Date  . Allergy   . Anemia   . Arthritis   . Cataract    bil cateracts removed  . Coronary artery disease   . Dementia arising in the senium and presenium ([place])   . Diabetes mellitus    Type 2  . Diverticulitis   . ED (erectile dysfunction)   . Elevated homocysteine   . ESRD (end stage renal disease) on dialysis ([place]) 03/2015  . GERD (gastroesophageal reflux disease)    pepto   . Gout   . Headache, unspecified 02/25/2015  . Hiatal hernia   . Hyperlipidemia   . Hypertension   . Hypothyroidism   . Pneumonia   . PVD (peripheral vascular disease) ([place])    has plastic aorta  . Renal insufficiency   . Seasonal allergies   . Sleep apnea    does not wear c-pap    Past Surgical History:  Procedure Laterality Date  . aortobifemoral bypass    . AV FISTULA PLACEMENT Left 12/01/2013   Procedure: ARTERIOVENOUS (AV) FISTULA CREATION- LEFT BRACHIOCEPHALIC;  Surgeon: Angelia Mould, MD;  Location: [place];  Service: Vascular;  Laterality: Left;  . [place] TRANSPOSITION Right 07/27/2014   Procedure: BASCILIC VEIN TRANSPOSITION;  Surgeon: Angelia Mould, MD;  Location: [place];  Service: Vascular;  Laterality: Right;  . BRAIN SURGERY  07/22/2018  . BREAST SURGERY     left - granulomatous mastitis  . BURR HOLE  Bilateral 07/22/2018   Procedure: BILATERAL BURR HOLES;  Surgeon: Kristeen Miss, MD;  Location: [place];  Service: Neurosurgery;  Laterality: Bilateral;  . COLONOSCOPY    . 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 ENTEROSCOPY N/A 06/03/2018   Procedure: ENTEROSCOPY;  Surgeon: Lavena Bullion, DO;  Location: [place];  Service: Gastroenterology;  Laterality: N/A;  . ENTEROSCOPY N/A 11/20/2018   Procedure: ENTEROSCOPY;  Surgeon: Rush Landmark Telford Nab., MD;  Location[facility] END[facility]COPY;  Service: Gastroenterology;  Laterality: N/A;  . ENTEROSCOPY N/A 12/03/2019   Procedure: ENTEROSCOPY;  Surgeon: Lavena Bullion, DO;  Location: [facility] ENDOSCOPY;  Service: Gastroenterology;  Laterality: N/A;  push enteroscopy  . EYE SURGERY Bilateral    cataracts  . HEMODIALYSIS INPATIENT  01/17/2018      . HOT HEMOSTASIS N/A 06/03/2018   Procedure: HOT HEMOSTASIS (ARGON PLASMA COAGULATION/BICAP);  Surgeon: Lavena Bullion, DO;  Location: [facility] ENDOSCOPY;  Service: Gastroenterology;  Laterality: N/A;  . HOT HEMOSTASIS N/A 11/20/2018   Procedure: HOT HEMOSTASIS (ARGON PLASMA COAGULATION/BICAP);  Surgeon: Irving Copas., MD;  Location: [place];  Service: Gastroenterology;  Laterality: N/A;  . HOT HEMOSTASIS N/A 12/03/2019   Procedure: HOT HEMOSTASIS (ARGON PLASMA COAGULATION/BICAP);  Surgeon: Lavena Bullion, DO;  Location: [facility];  Service: Gastroenterology;  Laterality: N/A;  . INTRAMEDULLARY (IM)  NAIL INTERTROCHANTERIC Right 11/08/2020   Procedure: INTRAMEDULLARY (IM) NAIL INTERTROCHANTRIC;  Surgeon: Erle Crocker, MD;  Location: [place];  Service: Orthopedics;  Laterality: Right;  . REVISION OF ARTERIOVENOUS GORETEX GRAFT Right 09/01/2019   Procedure: REVISION OF ARTERIOVENOUS FISTULA RIGHT ARM;  Surgeon: Serafina Mitchell, MD;  [facility];  Service: Vascular;  Laterality: Right;  . REVISON OF ARTERIOVENOUS FISTULA Left 02/09/2014   Procedure: REVISON OF LEFT ARTERIOVENOUS  FISTULA - RESECTION OF RENDUNDANT VEIN;  Surgeon: Angelia Mould, MD;  Location: [place];  Service: Vascular;  Laterality: Left;  . SBO with lysis adhesions    . SHUNTOGRAM Left 04/19/2014   Procedure: FISTULOGRAM;  Surgeon: Angelia Mould, MD;  Location: [facility] CATH LAB;  Service: Cardiovascular;  Laterality: Left;  . UNILATERAL UPPER EXTREMEITY ANGIOGRAM N/A 07/12/2014   Procedure: UNILATERAL UPPER Anselmo Rod;  Surgeon: Angelia Mould, MD;  Location: [facility] CATH LAB;  Service: Cardiovascular;  Laterality: N/A;    Social History   Socioeconomic History  . Marital status: Married    Spouse name: [name] Mood  . Number of children: 1  . Years of education: 6  . Highest education level: Not on file  Occupational History  . Occupation: Retired  Tobacco Use  . Smoking status: Former Smoker    Quit date: 09/24/1994    Years since quitting: 26.2  . Smokeless tobacco: Never Used  Vaping Use  . Vaping Use: Never used  Substance and Sexual Activity  . Alcohol use: No    Alcohol/week: 0.0 standard drinks    Comment: Quit Oct. 1977 ("somewhat heavy")  . Drug use: No  . Sexual activity: Not on file  Other Topics Concern  . Not on file  Social History Narrative   Lives at home with wife.   Caffeine use: Drinks no tea, "a little soda"   Drinks 1 cup coffee/week   Right handed   Social Determinants of Health   Financial Resource Strain: Not on file  Food Insecurity: Not on file  Transportation Needs: Not on file  Physical Activity: Not on file  Stress: Not on file  Social Connections: Not on file  Intimate Partner Violence: Not on file   Family History  Problem Relation Age of Onset  . Aneurysm Mother        brain  . Heart disease Father   . Stroke Father   . Hypertension Father   . Diabetes Father   . Dementia Neg Hx   . Colon cancer Neg Hx   . Esophageal cancer Neg Hx   . Pancreatic cancer Neg Hx   . Prostate cancer Neg Hx   . Rectal cancer Neg Hx   .  Stomach cancer Neg Hx     Current Outpatient Medications  Medication Sig Dispense Refill  . acetaminophen (TYLENOL) 325 MG tablet Take 1-2 tablets (325-650 mg total) by mouth every 4 (four) hours as needed for mild pain. (Patient taking differently: Take 650 mg by mouth in the morning and at bedtime.)    . albuterol (VENTOLIN HFA) 108 (90 Base) MCG/ACT inhaler Inhale 1 puff into the lungs daily as needed for wheezing or shortness of breath.    . allopurinol (ZYLOPRIM) 100 MG tablet Take 100 mg by mouth daily.    Marland Kitchen aspirin EC 325 MG tablet Take 1 tablet (325 mg total) by mouth daily. 30 tablet 11  . Blood Glucose Monitoring Suppl (FREESTYLE FREEDOM LITE) w/Device KIT Use to check blood sugar 2 times per  day dx code E11.65 1 each 0  . camphor-menthol (SARNA) lotion Apply 1 application topically 4 (four) times daily as needed for itching.     . Carboxymethylcellulose Sodium (THERATEARS) 0.25 % SOLN Place 1 drop into both eyes as needed (for dryness).    . cetirizine (ZYRTEC) 10 MG chewable tablet Chew 10 mg by mouth every Monday, Wednesday, and Friday.    . clobetasol (TEMOVATE) 0.05 % external solution Apply 1 application topically 2 (two) times daily as needed (scalp irritation.).   0  . denosumab (PROLIA) 60 MG/ML SOSY injection Inject 60 mg into the skin every 6 (six) months.    . docusate sodium (COLACE) 100 MG capsule Take 1 capsule (100 mg total) by mouth 2 (two) times daily. 10 capsule 0  . donepezil (ARICEPT) 10 MG tablet Take 1 tablet (10 mg total) by mouth at bedtime. 90 tablet 3  . FLUOCINOLONE ACETONIDE SCALP 0.01 % OIL Apply 1 application topically daily as needed (scalp irritation.).   0  . fluticasone (FLONASE) 50 MCG/ACT nasal spray Place 1 spray into both nostrils daily. (Patient taking differently: Place 1 spray into both nostrils daily as needed for allergies.) 16 g 2  . gabapentin (NEURONTIN) 100 MG capsule Take 100 mg by mouth at bedtime.    Marland Kitchen glucose blood (FREESTYLE LITE)  test strip USE THREE TIMES DAILY 300 strip 2  . hydrocortisone 2.5 % cream Apply 1 application topically 3 (three) times daily as needed (skin irritation (legs & arms)).     Marland Kitchen insulin aspart (NOVOLOG FLEXPEN) 100 UNIT/ML FlexPen INJECT 6-8 UNITS UNDER THE SKIN THREE TIMES DAILY BEFORE MEALS. DX:E11.65 (Patient taking differently: Inject 6-8 Units into the skin See admin instructions. Inject 6-8 units into the skin three times a day with meals, PER SLIDING SCALE) 15 mL 12  . Insulin Pen Needle (PEN NEEDLES) 32G X 4 MM MISC 1 each by Does not apply route 3 (three) times daily. USE PEN NEEDLES TO INJECT INSULIN THREE TIMES DAILY. 100 each 12  . ketoconazole (NIZORAL) 2 % shampoo Apply 1 application topically 2 (two) times a week. (Patient taking differently: Apply 1 application topically daily as needed for irritation (SHAMPOO).) 120 mL 0  . Lancets (FREESTYLE) lancets Use as instructed to check blood sugar 2 times per day dx code E11.65 100 each 3  . levothyroxine (SYNTHROID, LEVOTHROID) 50 MCG tablet Take 1 tablet (50 mcg total) by mouth daily. (Patient taking differently: Take 50 mcg by mouth daily before breakfast.) 90 tablet 1  . lidocaine (LIDODERM) 5 % Place 1 patch onto the skin daily as needed (back pain).     . memantine (NAMENDA) 10 MG tablet Take 1 tablet (10 mg total) by mouth 2 (two) times daily. 180 tablet 3  . metoprolol tartrate (LOPRESSOR) 25 MG tablet Take 0.5 tablets (12.5 mg total) by mouth 2 (two) times daily.    . midodrine (PROAMATINE) 10 MG tablet Take one pill prior to hemodialysis on MWF (Patient taking differently: Take 10 mg by mouth See admin instructions. Take 10 mg by mouth prior to dialysis on Mon/Wed/Fri)    . Nutritional Supplements (FEEDING SUPPLEMENT, NEPRO CARB STEADY,) LIQD Take 237 mLs by mouth in the morning and at bedtime.     Marland Kitchen omeprazole (PRILOSEC) 40 MG capsule Take 1 capsule (40 mg total) by mouth daily. (Patient taking differently: Take 40 mg by mouth daily  before breakfast.) 30 capsule 0  . sevelamer carbonate (RENVELA) 800 MG tablet Take 1,600-2,400 mg  by mouth See admin instructions. Take 2,400 mg by mouth three times a day with meals and 1,600 mg with snacks on Sun/Tues/Thurs/Sat and 2,400 mg with breakfast and dinner and nothing with snacks on Mon/Wed/Fri    . simvastatin (ZOCOR) 20 MG tablet Take 20 mg by mouth at bedtime.    . TRADJENTA 5 MG TABS tablet TAKE 1 TABLET DAILY (Patient taking differently: Take 5 mg by mouth daily.) 90 tablet 3   No current facility-administered medications for this visit.    Allergies  Allergen Reactions  . Ambien [Zolpidem Tartrate] Other (See Comments)    Hallucinations and "felt crazy"   . Penicillins Rash and Hives    Has patient had a PCN reaction causing immediate rash, facial/tongue/throat swelling, SOB or lightheadedness with hypotension: Yes Has patient had a PCN reaction causing severe rash involving mucus membranes or skin necrosis: Yes Has patient had a PCN reaction that required hospitalization: No Has patient had a PCN reaction occurring within the last 10 years: No If all of the above answers are "NO", then may proceed with Cephalosporin use.  Other reaction(s): HIVES     REVIEW OF SYSTEMS:   _0  denotes positive finding, _1  denotes negative finding Cardiac  Comments:  Chest pain or chest pressure:    Shortness of breath upon exertion:    Short of breath when lying flat:    Irregular heart rhythm:        Vascular    Pain in calf, thigh, or hip brought on by ambulation:    Pain in feet at night that wakes you up from your sleep:     Blood clot in your veins:    Leg swelling:         Pulmonary    Oxygen at home:    Productive cough:     Wheezing:         Neurologic    Sudden weakness in arms or legs:     Sudden numbness in arms or legs:     Sudden onset of difficulty speaking or slurred speech:    Temporary loss of vision in one eye:     Problems with dizziness:          Gastrointestinal    Blood in stool:     Vomited blood:         Genitourinary    Burning when urinating:     Blood in urine:        Psychiatric    Major depression:         Hematologic    Bleeding problems:    Problems with blood clotting too easily:        Skin    Rashes or ulcers:        Constitutional    Fever or chills:      PHYSICAL EXAMINATION:   General:  WDWN in NAD; vital signs documented above Gait: Uses WC HENT: WNL, normocephalic Pulmonary: normal non-labored breathing , without Rales, rhonchi,  wheezing Cardiac: regular HR Abdomen: soft, NT, no masses Skin: without rashes Vascular Exam/Pulses: 2+ radial, femoral pulses bilaterally. 2+ right PT pulse, 2+ left DP pulse Extremities: without ischemic changes, without Gangrene , without cellulitis; without open wounds;  Musculoskeletal: no muscle wasting or atrophy  Neurologic: A&O X 3;  No focal weakness or paresthesias are detected Psychiatric:  The pt has Normal affect.   Non-Invasive Vascular Imaging:   12/01/2020 ABI/TBIToday's ABIToday's TBIPrevious ABIPrevious TBI  +-------+-----------+-----------+------------+------------+  Right 1.12  0.83    0.64    0.56      +-------+-----------+-----------+------------+------------+  Left  1.31    0.62    0.92    0.64      +-------+-----------+-----------+------------+------------+ The right great toe pressure is 114.  Waveforms are monophasic and biphasic. Left great toe pressure is 85.  Waveforms are biphasic and monophasic   ASSESSMENT/PLAN:: 80 y.o. male here for follow up for PAD. S.p aorto-tbifemoral bypass in the remote past.  Recent fall resulting in right femur fracture currently in rehab center.  He does not walk enough to determine claudication symptoms.  No rest pain.  Right ABI within normal limits left may be falsely elevated.  No tissue loss.  Recheck in 1 year with ABIs.  I advised his wife to call  our office should he develop rest pain or issues with skin ulcers of his feet.  Barbie Banner, PA-C [facility] [phone]  Clinic MD:   Oneida Alar

## 2020-12-02 DIAGNOSIS — F0280 Dementia in other diseases classified elsewhere without behavioral disturbance: Secondary | ICD-10-CM | POA: Diagnosis not present

## 2020-12-02 DIAGNOSIS — D538 Other specified nutritional anemias: Secondary | ICD-10-CM | POA: Diagnosis not present

## 2020-12-02 DIAGNOSIS — S72001D Fracture of unspecified part of neck of right femur, subsequent encounter for closed fracture with routine healing: Secondary | ICD-10-CM | POA: Diagnosis not present

## 2020-12-02 DIAGNOSIS — Z9889 Other specified postprocedural states: Secondary | ICD-10-CM | POA: Diagnosis not present

## 2020-12-02 DIAGNOSIS — I7389 Other specified peripheral vascular diseases: Secondary | ICD-10-CM | POA: Diagnosis not present

## 2020-12-02 DIAGNOSIS — E1140 Type 2 diabetes mellitus with diabetic neuropathy, unspecified: Secondary | ICD-10-CM | POA: Diagnosis not present

## 2020-12-02 DIAGNOSIS — E119 Type 2 diabetes mellitus without complications: Secondary | ICD-10-CM | POA: Diagnosis not present

## 2020-12-02 DIAGNOSIS — E876 Hypokalemia: Secondary | ICD-10-CM | POA: Diagnosis not present

## 2020-12-02 DIAGNOSIS — N2581 Secondary hyperparathyroidism of renal origin: Secondary | ICD-10-CM | POA: Diagnosis not present

## 2020-12-02 DIAGNOSIS — I471 Supraventricular tachycardia: Secondary | ICD-10-CM | POA: Diagnosis not present

## 2020-12-02 DIAGNOSIS — Z992 Dependence on renal dialysis: Secondary | ICD-10-CM | POA: Diagnosis not present

## 2020-12-02 DIAGNOSIS — N186 End stage renal disease: Secondary | ICD-10-CM | POA: Diagnosis not present

## 2020-12-05 DIAGNOSIS — E119 Type 2 diabetes mellitus without complications: Secondary | ICD-10-CM | POA: Diagnosis not present

## 2020-12-05 DIAGNOSIS — E876 Hypokalemia: Secondary | ICD-10-CM | POA: Diagnosis not present

## 2020-12-05 DIAGNOSIS — N2581 Secondary hyperparathyroidism of renal origin: Secondary | ICD-10-CM | POA: Diagnosis not present

## 2020-12-05 DIAGNOSIS — N186 End stage renal disease: Secondary | ICD-10-CM | POA: Diagnosis not present

## 2020-12-05 DIAGNOSIS — Z992 Dependence on renal dialysis: Secondary | ICD-10-CM | POA: Diagnosis not present

## 2020-12-07 DIAGNOSIS — E119 Type 2 diabetes mellitus without complications: Secondary | ICD-10-CM | POA: Diagnosis not present

## 2020-12-07 DIAGNOSIS — N186 End stage renal disease: Secondary | ICD-10-CM | POA: Diagnosis not present

## 2020-12-07 DIAGNOSIS — N2581 Secondary hyperparathyroidism of renal origin: Secondary | ICD-10-CM | POA: Diagnosis not present

## 2020-12-07 DIAGNOSIS — E876 Hypokalemia: Secondary | ICD-10-CM | POA: Diagnosis not present

## 2020-12-07 DIAGNOSIS — Z992 Dependence on renal dialysis: Secondary | ICD-10-CM | POA: Diagnosis not present

## 2020-12-09 DIAGNOSIS — E876 Hypokalemia: Secondary | ICD-10-CM | POA: Diagnosis not present

## 2020-12-09 DIAGNOSIS — N186 End stage renal disease: Secondary | ICD-10-CM | POA: Diagnosis not present

## 2020-12-09 DIAGNOSIS — Z992 Dependence on renal dialysis: Secondary | ICD-10-CM | POA: Diagnosis not present

## 2020-12-09 DIAGNOSIS — S72141A Displaced intertrochanteric fracture of right femur, initial encounter for closed fracture: Secondary | ICD-10-CM | POA: Diagnosis not present

## 2020-12-09 DIAGNOSIS — E119 Type 2 diabetes mellitus without complications: Secondary | ICD-10-CM | POA: Diagnosis not present

## 2020-12-09 DIAGNOSIS — N2581 Secondary hyperparathyroidism of renal origin: Secondary | ICD-10-CM | POA: Diagnosis not present

## 2020-12-12 ENCOUNTER — Other Ambulatory Visit: Payer: Self-pay | Admitting: *Deleted

## 2020-12-12 DIAGNOSIS — N186 End stage renal disease: Secondary | ICD-10-CM | POA: Diagnosis not present

## 2020-12-12 DIAGNOSIS — N2581 Secondary hyperparathyroidism of renal origin: Secondary | ICD-10-CM | POA: Diagnosis not present

## 2020-12-12 DIAGNOSIS — E876 Hypokalemia: Secondary | ICD-10-CM | POA: Diagnosis not present

## 2020-12-12 DIAGNOSIS — Z992 Dependence on renal dialysis: Secondary | ICD-10-CM | POA: Diagnosis not present

## 2020-12-12 DIAGNOSIS — E119 Type 2 diabetes mellitus without complications: Secondary | ICD-10-CM | POA: Diagnosis not present

## 2020-12-12 NOTE — Patient Outreach (Signed)
[facility] Post-Acute Care Coordinator follow up. Member screened for potential [facility] Care Management needs.  Communication sent to [facility] SW to inquire about transition plans.    Kristene Liberati, MSN, RN,BSN [facility] Post Acute Care Coordinator [phone] ( Business Mobile) [phone]  (Toll free office)  

## 2020-12-13 ENCOUNTER — Other Ambulatory Visit: Payer: Self-pay | Admitting: *Deleted

## 2020-12-13 DIAGNOSIS — E1149 Type 2 diabetes mellitus with other diabetic neurological complication: Secondary | ICD-10-CM | POA: Diagnosis not present

## 2020-12-13 DIAGNOSIS — I7389 Other specified peripheral vascular diseases: Secondary | ICD-10-CM | POA: Diagnosis not present

## 2020-12-13 DIAGNOSIS — I150 Renovascular hypertension: Secondary | ICD-10-CM | POA: Diagnosis not present

## 2020-12-13 DIAGNOSIS — Z992 Dependence on renal dialysis: Secondary | ICD-10-CM | POA: Diagnosis not present

## 2020-12-13 DIAGNOSIS — D538 Other specified nutritional anemias: Secondary | ICD-10-CM | POA: Diagnosis not present

## 2020-12-13 DIAGNOSIS — F0280 Dementia in other diseases classified elsewhere without behavioral disturbance: Secondary | ICD-10-CM | POA: Diagnosis not present

## 2020-12-13 DIAGNOSIS — Z9889 Other specified postprocedural states: Secondary | ICD-10-CM | POA: Diagnosis not present

## 2020-12-13 DIAGNOSIS — D638 Anemia in other chronic diseases classified elsewhere: Secondary | ICD-10-CM | POA: Diagnosis not present

## 2020-12-13 DIAGNOSIS — N186 End stage renal disease: Secondary | ICD-10-CM | POA: Diagnosis not present

## 2020-12-13 NOTE — Patient Outreach (Signed)
[facility] Post-Care Coordinator follow up. Member screened for potential [facility] Care Management needs.  Update received from [facility] indicating [name]'s transition plan is to return home. He has [facility] services that he will continue to use post SNF.   Will plan outreach to member's wife/DPR [name] to discuss [place] Management services.   Marthenia Rolling, MSN, RN,BSN [place] Acute Care Coordinator [phone] [facility]) [phone]  (Toll free office)

## 2020-12-14 DIAGNOSIS — N2581 Secondary hyperparathyroidism of renal origin: Secondary | ICD-10-CM | POA: Diagnosis not present

## 2020-12-14 DIAGNOSIS — E876 Hypokalemia: Secondary | ICD-10-CM | POA: Diagnosis not present

## 2020-12-14 DIAGNOSIS — Z992 Dependence on renal dialysis: Secondary | ICD-10-CM | POA: Diagnosis not present

## 2020-12-14 DIAGNOSIS — E119 Type 2 diabetes mellitus without complications: Secondary | ICD-10-CM | POA: Diagnosis not present

## 2020-12-14 DIAGNOSIS — N186 End stage renal disease: Secondary | ICD-10-CM | POA: Diagnosis not present

## 2020-12-16 DIAGNOSIS — E876 Hypokalemia: Secondary | ICD-10-CM | POA: Diagnosis not present

## 2020-12-16 DIAGNOSIS — E119 Type 2 diabetes mellitus without complications: Secondary | ICD-10-CM | POA: Diagnosis not present

## 2020-12-16 DIAGNOSIS — N2581 Secondary hyperparathyroidism of renal origin: Secondary | ICD-10-CM | POA: Diagnosis not present

## 2020-12-16 DIAGNOSIS — N186 End stage renal disease: Secondary | ICD-10-CM | POA: Diagnosis not present

## 2020-12-16 DIAGNOSIS — Z992 Dependence on renal dialysis: Secondary | ICD-10-CM | POA: Diagnosis not present

## 2020-12-19 DIAGNOSIS — N186 End stage renal disease: Secondary | ICD-10-CM | POA: Diagnosis not present

## 2020-12-19 DIAGNOSIS — Z992 Dependence on renal dialysis: Secondary | ICD-10-CM | POA: Diagnosis not present

## 2020-12-19 DIAGNOSIS — N2581 Secondary hyperparathyroidism of renal origin: Secondary | ICD-10-CM | POA: Diagnosis not present

## 2020-12-19 DIAGNOSIS — E876 Hypokalemia: Secondary | ICD-10-CM | POA: Diagnosis not present

## 2020-12-19 DIAGNOSIS — E119 Type 2 diabetes mellitus without complications: Secondary | ICD-10-CM | POA: Diagnosis not present

## 2020-12-20 ENCOUNTER — Other Ambulatory Visit: Payer: Self-pay

## 2020-12-20 ENCOUNTER — Other Ambulatory Visit: Payer: Self-pay | Admitting: *Deleted

## 2020-12-20 ENCOUNTER — Encounter (HOSPITAL_BASED_OUTPATIENT_CLINIC_OR_DEPARTMENT_OTHER): Payer: No Typology Code available for payment source | Admitting: Psychology

## 2020-12-20 DIAGNOSIS — Z87820 Personal history of traumatic brain injury: Secondary | ICD-10-CM

## 2020-12-20 DIAGNOSIS — R413 Other amnesia: Secondary | ICD-10-CM | POA: Diagnosis not present

## 2020-12-20 DIAGNOSIS — F0390 Unspecified dementia without behavioral disturbance: Secondary | ICD-10-CM | POA: Diagnosis not present

## 2020-12-20 NOTE — Patient Outreach (Signed)
[place] Coordinator follow up. Member screened for potential [facility] Care Management needs.  Mr. Robinson resides at [facility]. Telephone call made to spouse/DPR Rodgerick Gilliand [phone] to discuss [place] Management services.   Mrs. Vanosdol was visiting Mr. Gisler in SNF at the time of call. Asked Probation officer to contact her at later time to discuss [place] Management program.  Will plan outreach at later time. Will also plan to collaborate with SNF SW regarding anticipated transition date and plans.    Marthenia Rolling, MSN, RN,BSN [place] Acute Care Coordinator [phone] [facility]) [phone]  (Toll free office)

## 2020-12-21 ENCOUNTER — Other Ambulatory Visit: Payer: Self-pay | Admitting: *Deleted

## 2020-12-21 DIAGNOSIS — E876 Hypokalemia: Secondary | ICD-10-CM | POA: Diagnosis not present

## 2020-12-21 DIAGNOSIS — N2581 Secondary hyperparathyroidism of renal origin: Secondary | ICD-10-CM | POA: Diagnosis not present

## 2020-12-21 DIAGNOSIS — N186 End stage renal disease: Secondary | ICD-10-CM | POA: Diagnosis not present

## 2020-12-21 DIAGNOSIS — Z992 Dependence on renal dialysis: Secondary | ICD-10-CM | POA: Diagnosis not present

## 2020-12-21 DIAGNOSIS — E119 Type 2 diabetes mellitus without complications: Secondary | ICD-10-CM | POA: Diagnosis not present

## 2020-12-21 NOTE — Patient Outreach (Signed)
[facility] Care Management follow up. Member screened for potential [facility] Care Management needs.  Communication sent to [facility] SW to inquire about transition plans and date.   Will plan outreach again to wife as appropriate.    Marthenia Rolling, MSN, RN,BSN [place] Acute Care Coordinator [phone] [facility]) [phone]  (Toll free office)

## 2020-12-22 ENCOUNTER — Other Ambulatory Visit: Payer: Self-pay | Admitting: *Deleted

## 2020-12-22 DIAGNOSIS — E7849 Other hyperlipidemia: Secondary | ICD-10-CM | POA: Diagnosis not present

## 2020-12-22 DIAGNOSIS — E1140 Type 2 diabetes mellitus with diabetic neuropathy, unspecified: Secondary | ICD-10-CM | POA: Diagnosis not present

## 2020-12-22 DIAGNOSIS — I7389 Other specified peripheral vascular diseases: Secondary | ICD-10-CM | POA: Diagnosis not present

## 2020-12-22 DIAGNOSIS — N186 End stage renal disease: Secondary | ICD-10-CM | POA: Diagnosis not present

## 2020-12-22 DIAGNOSIS — Z9889 Other specified postprocedural states: Secondary | ICD-10-CM | POA: Diagnosis not present

## 2020-12-22 DIAGNOSIS — I150 Renovascular hypertension: Secondary | ICD-10-CM | POA: Diagnosis not present

## 2020-12-22 DIAGNOSIS — S72001D Fracture of unspecified part of neck of right femur, subsequent encounter for closed fracture with routine healing: Secondary | ICD-10-CM | POA: Diagnosis not present

## 2020-12-22 DIAGNOSIS — I471 Supraventricular tachycardia: Secondary | ICD-10-CM | POA: Diagnosis not present

## 2020-12-22 DIAGNOSIS — F0280 Dementia in other diseases classified elsewhere without behavioral disturbance: Secondary | ICD-10-CM | POA: Diagnosis not present

## 2020-12-22 DIAGNOSIS — D638 Anemia in other chronic diseases classified elsewhere: Secondary | ICD-10-CM | POA: Diagnosis not present

## 2020-12-22 DIAGNOSIS — Z992 Dependence on renal dialysis: Secondary | ICD-10-CM | POA: Diagnosis not present

## 2020-12-22 NOTE — Patient Outreach (Signed)
[place] Coordinator follow up. Member screened for potential [facility] Care Management needs.  Update received from [facility] indicating member will transition home with spouse on Saturday, April 2nd.   Writer will plan outreach again to Mrs.Debold to discuss [place] Management follow up.    Marthenia Rolling, MSN, RN,BSN [place] Acute Care Coordinator [phone] [facility]) [phone]  (Toll free office)

## 2020-12-23 ENCOUNTER — Other Ambulatory Visit: Payer: Self-pay | Admitting: *Deleted

## 2020-12-23 DIAGNOSIS — R531 Weakness: Secondary | ICD-10-CM

## 2020-12-23 DIAGNOSIS — E785 Hyperlipidemia, unspecified: Secondary | ICD-10-CM | POA: Diagnosis not present

## 2020-12-23 DIAGNOSIS — N186 End stage renal disease: Secondary | ICD-10-CM | POA: Diagnosis not present

## 2020-12-23 DIAGNOSIS — D509 Iron deficiency anemia, unspecified: Secondary | ICD-10-CM | POA: Diagnosis not present

## 2020-12-23 DIAGNOSIS — Z992 Dependence on renal dialysis: Secondary | ICD-10-CM | POA: Diagnosis not present

## 2020-12-23 DIAGNOSIS — N2581 Secondary hyperparathyroidism of renal origin: Secondary | ICD-10-CM | POA: Diagnosis not present

## 2020-12-23 DIAGNOSIS — E876 Hypokalemia: Secondary | ICD-10-CM | POA: Diagnosis not present

## 2020-12-23 DIAGNOSIS — E1122 Type 2 diabetes mellitus with diabetic chronic kidney disease: Secondary | ICD-10-CM | POA: Diagnosis not present

## 2020-12-23 NOTE — Patient Outreach (Signed)
[place] Coordinator follow up. Member screened for potential [facility] Care Management needs.  Telephone call made to Locklan Canoy (spouse/DPR) [phone]. Patient identifiers confirmed. Mrs. Heslin confirms member will return home on tomorrow 12/24/20 from [facility].  Member will have [facility] home health. He also has caregiver assistance on Monday, Wednesday, Fridays (HD days) from 0830 am until 11 am; Tuesdays and Thursdays caregiver assist from 0900 am until 12 pm.  Mrs. Gieske endorses that she will need assist with transportation to MD appointments. States transportation will be an issue for her due to member's recent femur fracture. Discussed referral to [place] for transportation assistance. Mrs. Herta Hink Busing agreeable.   Discussed [facility] Care Management RNCM follow up for care coordination. Mrs. Geister also agreeable.   Will place referral for [place] for transportation assist to MD appointments.  Will place referral to [place] for care coordination.  Mr. Lenderman will transition home from [facility] on tomorrow 12/24/20.  Email sent to Mrs. Cove Haydon Busing with [place] Management and writer's contact information.  Mr. Deavers has medical history of ESRD (HD on M,W,F), DM, dementia, neuropathy, femur fracture, diabetic neuropathy.    Marthenia Rolling, MSN, RN,BSN [place] Acute Care Coordinator [phone] [facility]) [phone]  (Toll free office)

## 2020-12-26 ENCOUNTER — Telehealth: Payer: Self-pay | Admitting: Internal Medicine

## 2020-12-26 ENCOUNTER — Other Ambulatory Visit: Payer: Self-pay | Admitting: *Deleted

## 2020-12-26 ENCOUNTER — Encounter: Payer: Self-pay | Admitting: *Deleted

## 2020-12-26 DIAGNOSIS — Z992 Dependence on renal dialysis: Secondary | ICD-10-CM | POA: Diagnosis not present

## 2020-12-26 DIAGNOSIS — D509 Iron deficiency anemia, unspecified: Secondary | ICD-10-CM | POA: Diagnosis not present

## 2020-12-26 DIAGNOSIS — E876 Hypokalemia: Secondary | ICD-10-CM | POA: Diagnosis not present

## 2020-12-26 DIAGNOSIS — N186 End stage renal disease: Secondary | ICD-10-CM | POA: Diagnosis not present

## 2020-12-26 DIAGNOSIS — N2581 Secondary hyperparathyroidism of renal origin: Secondary | ICD-10-CM | POA: Diagnosis not present

## 2020-12-26 DIAGNOSIS — E785 Hyperlipidemia, unspecified: Secondary | ICD-10-CM | POA: Diagnosis not present

## 2020-12-26 NOTE — Telephone Encounter (Signed)
We need records from [facility]. He was admitted to the hospital after a fall at home and had a fractured femur.

## 2020-12-26 NOTE — Telephone Encounter (Signed)
Called [facility] and they are going to fax over records

## 2020-12-26 NOTE — Patient Outreach (Signed)
[facility]) Care Management  12/26/2020  [name] 10-06-1940 [id number]  New patient referral. Pt sustained R hip fx in February and has spent 6 weeks in rehab, just being discharged on Saturday back home. He resides there with his wife they have assistance from the [place] 5 days a week for 4 hours a day.   Pt has medical hx of ESRD on hemodialysis Mondays, Wednesdays and Fridays. 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  [name] says she cannot assist him as well as before this fall and fx. He is too big and it is unsafe for her to do a lot by herself. She has previously requested transportation via a [place] for transportation. He needs to be accompanied since he has demential also.  [name] reports a general decline in her husband. He is not eating well, has lost a lot of weight, current 158. She says sometimes he just looks like he has forgotten what he is supposed to do.  Patient was recently discharged from hospital and all medications have been reviewed. Outpatient Encounter Medications as of 12/26/2020  Medication Sig  . acetaminophen (TYLENOL) 325 MG tablet Take 1-2 tablets (325-650 mg total) by mouth every 4 (four) hours as needed for mild pain. (Patient taking differently: Take 650 mg by mouth in the morning and at bedtime.)  . albuterol (VENTOLIN HFA) 108 (90 Base) MCG/ACT inhaler Inhale 1 puff into the lungs daily as needed for wheezing or shortness of breath.  . allopurinol (ZYLOPRIM) 100 MG tablet Take 100 mg by mouth daily.  Marland Kitchen aspirin EC 325 MG tablet Take 1 tablet (325 mg total) by mouth daily.  . Blood Glucose Monitoring Suppl (FREESTYLE FREEDOM LITE) w/Device KIT Use to check blood sugar 2 times per day dx code E11.65  . camphor-menthol (SARNA) lotion Apply 1 application topically 4 (four) times daily  as needed for itching.   . Carboxymethylcellulose Sodium (THERATEARS) 0.25 % SOLN Place 1 drop into both eyes as needed (for dryness).  . cetirizine (ZYRTEC) 10 MG chewable tablet Chew 10 mg by mouth every Monday, Wednesday, and Friday.  . clobetasol (TEMOVATE) 0.05 % external solution Apply 1 application topically 2 (two) times daily as needed (scalp irritation.).   Marland Kitchen denosumab (PROLIA) 60 MG/ML SOSY injection Inject 60 mg into the skin every 6 (six) months.  . docusate sodium (COLACE) 100 MG capsule Take 1 capsule (100 mg total) by mouth 2 (two) times daily.  Marland Kitchen donepezil (ARICEPT) 10 MG tablet Take 1 tablet (10 mg total) by mouth at bedtime.  Marland Kitchen FLUOCINOLONE ACETONIDE SCALP 0.01 % OIL Apply 1 application topically daily as needed (scalp irritation.).   Marland Kitchen fluticasone (FLONASE) 50 MCG/ACT nasal spray Place 1 spray into both nostrils daily. (Patient taking differently: Place 1 spray into both nostrils daily as needed for allergies.)  . gabapentin (NEURONTIN) 100 MG capsule Take 100 mg by mouth at bedtime.  Marland Kitchen glucose blood (FREESTYLE LITE) test strip USE THREE TIMES DAILY  . hydrocortisone 2.5 % cream Apply 1 application topically 3 (three) times daily as needed (skin irritation (legs & arms)).   Marland Kitchen insulin aspart (NOVOLOG FLEXPEN) 100 UNIT/ML FlexPen INJECT 6-8 UNITS UNDER THE SKIN THREE TIMES DAILY BEFORE MEALS. DX:E11.65 (Patient taking differently: Inject 6-8 Units into the skin See admin instructions. Inject 6-8 units into the skin three times a day with meals, PER SLIDING  SCALE)  . Insulin Pen Needle (PEN NEEDLES) 32G X 4 MM MISC 1 each by Does not apply route 3 (three) times daily. USE PEN NEEDLES TO INJECT INSULIN THREE TIMES DAILY.  Marland Kitchen ketoconazole (NIZORAL) 2 % shampoo Apply 1 application topically 2 (two) times a week. (Patient taking differently: Apply 1 application topically daily as needed for irritation (SHAMPOO).)  . Lancets (FREESTYLE) lancets Use as instructed to check blood sugar 2 times  per day dx code E11.65  . levothyroxine (SYNTHROID, LEVOTHROID) 50 MCG tablet Take 1 tablet (50 mcg total) by mouth daily. (Patient taking differently: Take 50 mcg by mouth daily before breakfast.)  . lidocaine (LIDODERM) 5 % Place 1 patch onto the skin daily as needed (back pain).   . memantine (NAMENDA) 10 MG tablet Take 1 tablet (10 mg total) by mouth 2 (two) times daily.  . metoprolol tartrate (LOPRESSOR) 25 MG tablet Take 0.5 tablets (12.5 mg total) by mouth 2 (two) times daily.  . midodrine (PROAMATINE) 10 MG tablet Take one pill prior to hemodialysis on MWF (Patient taking differently: Take 10 mg by mouth See admin instructions. Take 10 mg by mouth prior to dialysis on Mon/Wed/Fri)  . Nutritional Supplements (FEEDING SUPPLEMENT, NEPRO CARB STEADY,) LIQD Take 237 mLs by mouth in the morning and at bedtime.   Marland Kitchen omeprazole (PRILOSEC) 40 MG capsule Take 1 capsule (40 mg total) by mouth daily. (Patient taking differently: Take 40 mg by mouth daily before breakfast.)  . sevelamer carbonate (RENVELA) 800 MG tablet Take 1,600-2,400 mg by mouth See admin instructions. Take 2,400 mg by mouth three times a day with meals and 1,600 mg with snacks on Sun/Tues/Thurs/Sat and 2,400 mg with breakfast and dinner and nothing with snacks on Mon/Wed/Fri  . simvastatin (ZOCOR) 20 MG tablet Take 20 mg by mouth at bedtime.  . TRADJENTA 5 MG TABS tablet TAKE 1 TABLET DAILY (Patient taking differently: Take 5 mg by mouth daily.)   No facility-administered encounter medications on file as of 12/26/2020.   We agreed we will talk again in a week to resume his initial assessment intake.  Eulah Pont. Myrtie Neither, MSN, [facility] Gerontological Nurse Practitioner [facility] Care Management [phone]

## 2020-12-26 NOTE — Telephone Encounter (Signed)
[name]  [phone]  [name] called to say that [name] was discharged from [facility] on 11/23/2020, from his fall and surgery on Super Bowl Sunday. She did say that [facility] was going to be providing Home Health services and would be needing [place] to sign off on their orders.   I did ask Mrs.Leitzel if she would have the facility to fax [place] some discharge paperwork so we would know what went on while he was there.

## 2020-12-27 ENCOUNTER — Encounter: Payer: Medicare Other | Attending: Psychology | Admitting: Psychology

## 2020-12-27 ENCOUNTER — Other Ambulatory Visit: Payer: Self-pay

## 2020-12-27 ENCOUNTER — Telehealth: Payer: Self-pay

## 2020-12-27 ENCOUNTER — Telehealth: Payer: Self-pay | Admitting: Internal Medicine

## 2020-12-27 DIAGNOSIS — I739 Peripheral vascular disease, unspecified: Secondary | ICD-10-CM | POA: Diagnosis not present

## 2020-12-27 DIAGNOSIS — R413 Other amnesia: Secondary | ICD-10-CM | POA: Insufficient documentation

## 2020-12-27 DIAGNOSIS — Z794 Long term (current) use of insulin: Secondary | ICD-10-CM | POA: Diagnosis not present

## 2020-12-27 DIAGNOSIS — S72144D Nondisplaced intertrochanteric fracture of right femur, subsequent encounter for closed fracture with routine healing: Secondary | ICD-10-CM | POA: Diagnosis not present

## 2020-12-27 DIAGNOSIS — Z7984 Long term (current) use of oral hypoglycemic drugs: Secondary | ICD-10-CM | POA: Diagnosis not present

## 2020-12-27 DIAGNOSIS — I120 Hypertensive chronic kidney disease with stage 5 chronic kidney disease or end stage renal disease: Secondary | ICD-10-CM | POA: Diagnosis not present

## 2020-12-27 DIAGNOSIS — D631 Anemia in chronic kidney disease: Secondary | ICD-10-CM | POA: Diagnosis not present

## 2020-12-27 DIAGNOSIS — E1122 Type 2 diabetes mellitus with diabetic chronic kidney disease: Secondary | ICD-10-CM | POA: Diagnosis not present

## 2020-12-27 DIAGNOSIS — Z87820 Personal history of traumatic brain injury: Secondary | ICD-10-CM | POA: Diagnosis not present

## 2020-12-27 DIAGNOSIS — Z992 Dependence on renal dialysis: Secondary | ICD-10-CM | POA: Diagnosis not present

## 2020-12-27 DIAGNOSIS — N186 End stage renal disease: Secondary | ICD-10-CM | POA: Diagnosis not present

## 2020-12-27 DIAGNOSIS — F0390 Unspecified dementia without behavioral disturbance: Secondary | ICD-10-CM | POA: Insufficient documentation

## 2020-12-27 DIAGNOSIS — Z7982 Long term (current) use of aspirin: Secondary | ICD-10-CM | POA: Diagnosis not present

## 2020-12-27 DIAGNOSIS — R1312 Dysphagia, oropharyngeal phase: Secondary | ICD-10-CM | POA: Diagnosis not present

## 2020-12-27 NOTE — Telephone Encounter (Signed)
Marzetta Board - Physical Therapist [facility] called she had been out to home for evalaluation she would like to get verbal orders for  PT twice a week for 4 weeks and once a week for 4 weeks  I let her know we do not do verbal orders and ask if they could have orders fax to office, she stated this could delay patient care and he really needed as soon as possible.

## 2020-12-27 NOTE — Telephone Encounter (Signed)
Transition Care Management Follow-up Telephone Call  Date of discharge and from where: 12/24/20 [facility]   How have you been since you were released from the hospital? Not good, difficulty getting up from sitting position, walking and lifting his legs. Not continent. Weak. Cognition is "really bad". Appetite not good.     Any questions or concerns? Wife was told that he will need a referral to cardiology bc they heard a murmur before surgery but cardiologist okayed surgery.   Items Reviewed:  Did the pt receive and understand the discharge instructions provided? Yes  Medications obtained and verified? Yes  Other? Metoprolol 12.5mg  BID  Any new allergies since your discharge? NO    Dietary orders reviewed? Yes   Do you have support at home? Yes, wife. Has someone come to the house 5 days a week to help bathe him and dress him.   Home Care and Equipment/Supplies: Were home health services ordered?  If so, what is the name of the agency?  [facility] and PT.  Has the agency set up a time to come to the patient's home? PT was there today, OC scheduled on Thursday 4/7 Were any new equipment or medical supplies ordered? Wheelchair.  What is the name of the medical supply agency? Medicare?   Were you able to get the supplies/equipment? Yes Do you have any questions related to the use of the equipment or supplies? No  Functional Questionnaire: (I = Independent and D = Dependent) ADLs: D  Bathing/Dressing- D  Meal Prep- D  Eating- I  Maintaining continence- D   Transferring/Ambulation- D  Managing Meds- D  Follow up appointments reviewed:   PCP Hospital f/u appt confirmed? Yes, 01/03/21 at 12:00pm  Specialist Hospital f/u appt confirmed? Yes, ortho 04/29  Are transportation arrangements needed? Yes, [facility] is working on arranging transportation.   If their condition worsens, is the pt aware to call PCP or go to the Emergency Dept.? yes  Was the  patient provided with contact information for the PCP's office or ED? Yes  Was to pt encouraged to call back with questions or concerns? Yes.

## 2020-12-27 NOTE — Telephone Encounter (Signed)
Received records from [facility]

## 2020-12-28 DIAGNOSIS — E785 Hyperlipidemia, unspecified: Secondary | ICD-10-CM | POA: Diagnosis not present

## 2020-12-28 DIAGNOSIS — Z992 Dependence on renal dialysis: Secondary | ICD-10-CM | POA: Diagnosis not present

## 2020-12-28 DIAGNOSIS — N2581 Secondary hyperparathyroidism of renal origin: Secondary | ICD-10-CM | POA: Diagnosis not present

## 2020-12-28 DIAGNOSIS — N186 End stage renal disease: Secondary | ICD-10-CM | POA: Diagnosis not present

## 2020-12-28 DIAGNOSIS — E876 Hypokalemia: Secondary | ICD-10-CM | POA: Diagnosis not present

## 2020-12-28 DIAGNOSIS — D509 Iron deficiency anemia, unspecified: Secondary | ICD-10-CM | POA: Diagnosis not present

## 2020-12-29 ENCOUNTER — Telehealth: Payer: Self-pay | Admitting: Internal Medicine

## 2020-12-29 DIAGNOSIS — S72144D Nondisplaced intertrochanteric fracture of right femur, subsequent encounter for closed fracture with routine healing: Secondary | ICD-10-CM | POA: Diagnosis not present

## 2020-12-29 DIAGNOSIS — D631 Anemia in chronic kidney disease: Secondary | ICD-10-CM | POA: Diagnosis not present

## 2020-12-29 DIAGNOSIS — N186 End stage renal disease: Secondary | ICD-10-CM | POA: Diagnosis not present

## 2020-12-29 DIAGNOSIS — F0390 Unspecified dementia without behavioral disturbance: Secondary | ICD-10-CM | POA: Diagnosis not present

## 2020-12-29 DIAGNOSIS — I120 Hypertensive chronic kidney disease with stage 5 chronic kidney disease or end stage renal disease: Secondary | ICD-10-CM | POA: Diagnosis not present

## 2020-12-29 DIAGNOSIS — E1122 Type 2 diabetes mellitus with diabetic chronic kidney disease: Secondary | ICD-10-CM | POA: Diagnosis not present

## 2020-12-29 NOTE — Telephone Encounter (Signed)
   Telephone encounter was:  Unsuccessful.  12/29/2020 Name: ALEX LEAHY MRN: [id number] DOB: June 13, 1941  Unsuccessful outbound call made today to assist with:  Transportation Needs   Outreach Attempt:  1st Attempt  A HIPAA compliant voice message was left requesting a return call.  Instructed patient to call back at [phone]. Pt is a [place], [place], Care Management Phone: [phone] Email: [email or web address]

## 2020-12-30 ENCOUNTER — Telehealth: Payer: Self-pay | Admitting: Internal Medicine

## 2020-12-30 DIAGNOSIS — E785 Hyperlipidemia, unspecified: Secondary | ICD-10-CM | POA: Diagnosis not present

## 2020-12-30 DIAGNOSIS — N2581 Secondary hyperparathyroidism of renal origin: Secondary | ICD-10-CM | POA: Diagnosis not present

## 2020-12-30 DIAGNOSIS — D509 Iron deficiency anemia, unspecified: Secondary | ICD-10-CM | POA: Diagnosis not present

## 2020-12-30 DIAGNOSIS — N186 End stage renal disease: Secondary | ICD-10-CM | POA: Diagnosis not present

## 2020-12-30 DIAGNOSIS — E876 Hypokalemia: Secondary | ICD-10-CM | POA: Diagnosis not present

## 2020-12-30 DIAGNOSIS — Z992 Dependence on renal dialysis: Secondary | ICD-10-CM | POA: Diagnosis not present

## 2020-12-30 NOTE — Telephone Encounter (Signed)
   Telephone encounter was:  Successful.  12/30/2020 Name: [name] MRN: [id number] DOB: [birth year]  [name] is a 80 y.o. year old male who is a primary care patient of Baxley, Cresenciano Lick, MD . The community resource team was consulted for assistance with Transportation Needs  and Caregiver Stress  Care guide performed the following interventions: Patient provided with information about care guide support team and interviewed to confirm resource needs Discussed resources to assist with transportation. I sent pt's wife [name] the application for [employer]. In the meantime I set up [facility] transportation for his upcoming drs appoinment on the 12th.  Obtained verbal consent to place patient referral to [facility] to help her find more resources that will give her help when pt comes home from dialysis. [name] cannot lift him or clean him up if he has an accident with his bowels. She does get about 13 hours of help now through the [place] but could really use more help when he comes home from dialysis.  Placed referral to [facility] via email. .  Follow Up Plan:  Care guide will follow up with patient by phone over the next week to see if she has had time to fill out the [employer] application.   [place], Care Management Phone: [phone] Email: [email or web address]

## 2020-12-31 DIAGNOSIS — D631 Anemia in chronic kidney disease: Secondary | ICD-10-CM | POA: Diagnosis not present

## 2020-12-31 DIAGNOSIS — S72144D Nondisplaced intertrochanteric fracture of right femur, subsequent encounter for closed fracture with routine healing: Secondary | ICD-10-CM | POA: Diagnosis not present

## 2020-12-31 DIAGNOSIS — N186 End stage renal disease: Secondary | ICD-10-CM | POA: Diagnosis not present

## 2020-12-31 DIAGNOSIS — E1122 Type 2 diabetes mellitus with diabetic chronic kidney disease: Secondary | ICD-10-CM | POA: Diagnosis not present

## 2020-12-31 DIAGNOSIS — F0390 Unspecified dementia without behavioral disturbance: Secondary | ICD-10-CM | POA: Diagnosis not present

## 2020-12-31 DIAGNOSIS — I120 Hypertensive chronic kidney disease with stage 5 chronic kidney disease or end stage renal disease: Secondary | ICD-10-CM | POA: Diagnosis not present

## 2021-01-02 ENCOUNTER — Telehealth: Payer: Self-pay | Admitting: Internal Medicine

## 2021-01-02 DIAGNOSIS — E876 Hypokalemia: Secondary | ICD-10-CM | POA: Diagnosis not present

## 2021-01-02 DIAGNOSIS — N186 End stage renal disease: Secondary | ICD-10-CM | POA: Diagnosis not present

## 2021-01-02 DIAGNOSIS — N2581 Secondary hyperparathyroidism of renal origin: Secondary | ICD-10-CM | POA: Diagnosis not present

## 2021-01-02 DIAGNOSIS — D509 Iron deficiency anemia, unspecified: Secondary | ICD-10-CM | POA: Diagnosis not present

## 2021-01-02 DIAGNOSIS — Z992 Dependence on renal dialysis: Secondary | ICD-10-CM | POA: Diagnosis not present

## 2021-01-02 DIAGNOSIS — E785 Hyperlipidemia, unspecified: Secondary | ICD-10-CM | POA: Diagnosis not present

## 2021-01-02 NOTE — Telephone Encounter (Signed)
   ABDELRAHMAN NAIR DOB: 1941-09-03 MRN: [id number]   RIDER WAIVER AND RELEASE OF LIABILITY  For purposes of improving physical access to our facilities, [facility] is pleased to partner with third parties to provide [place] patients or other authorized individuals the option of convenient, on-demand ground transportation services (the Technical brewer") through use of the technology service that enables users to request on-demand ground transportation from independent third-party providers.  By opting to use and accept these [employer], I, the undersigned, hereby agree on behalf of myself, and on behalf of any minor child using the [employer] for whom I am the parent or legal guardian, as follows:  1. Government social research officer provided to me are provided by independent third-party transportation providers who are not [employer] or employees and who are unaffiliated with [employer]. 2. [place] is neither a transportation carrier nor a common or public carrier. 3. [place] has no control over the quality or safety of the transportation that occurs as a result of the [employer]. 4. [place] cannot guarantee that any third-party transportation provider will complete any arranged transportation service. 5. [place] makes no representation, warranty, or guarantee regarding the reliability, timeliness, quality, safety, suitability, or availability of any of the Transport Services or that they will be error free. 6. I fully understand that traveling by vehicle involves risks and dangers of serious bodily injury, including permanent disability, paralysis, and death. I agree, on behalf of myself and on behalf of any minor child using the Transport Services for whom I am the parent or legal guardian, that the entire risk arising out of my use of the [employer] remains solely with me, to the maximum extent permitted under applicable law. 7. The [employer] are provided "as is" and "as available." [place] disclaims all representations and warranties, express, implied or statutory, not expressly set out in these terms, including the implied warranties of merchantability and fitness for a particular purpose. 8. I hereby waive and release [place], its agents, employees, officers, directors, representatives, insurers, attorneys, assigns, successors, subsidiaries, and affiliates from any and all past, present, or future claims, demands, liabilities, actions, causes of action, or suits of any kind directly or indirectly arising from acceptance and use of the [employer]. 9. I further waive and release [place] and its affiliates from all present and future liability and responsibility for any injury or death to persons or damages to property caused by or related to the use of the [employer]. 10. I have read this Waiver and Release of Liability, and I understand the terms used in it and their legal significance. This Waiver is freely and voluntarily given with the understanding that my right (as well as the right of any minor child for whom I am the parent or legal guardian using the [employer]) to legal recourse against [place] in connection with the [employer] is knowingly surrendered in return for use of these services.   I attest that I read the consent document to [name], gave Mr. Westrup the opportunity to ask questions and answered the questions asked (if any). I affirm that [name] then provided consent for he's participation in this program.     Legrand Pitts

## 2021-01-03 ENCOUNTER — Other Ambulatory Visit: Payer: Self-pay

## 2021-01-03 ENCOUNTER — Ambulatory Visit (INDEPENDENT_AMBULATORY_CARE_PROVIDER_SITE_OTHER): Payer: Medicare Other | Admitting: Internal Medicine

## 2021-01-03 ENCOUNTER — Telehealth: Payer: Self-pay

## 2021-01-03 ENCOUNTER — Encounter: Payer: Self-pay | Admitting: Internal Medicine

## 2021-01-03 VITALS — BP 100/60 | HR 74

## 2021-01-03 DIAGNOSIS — N186 End stage renal disease: Secondary | ICD-10-CM | POA: Diagnosis not present

## 2021-01-03 DIAGNOSIS — Z992 Dependence on renal dialysis: Secondary | ICD-10-CM | POA: Diagnosis not present

## 2021-01-03 DIAGNOSIS — R7989 Other specified abnormal findings of blood chemistry: Secondary | ICD-10-CM

## 2021-01-03 DIAGNOSIS — F0280 Dementia in other diseases classified elsewhere without behavioral disturbance: Secondary | ICD-10-CM | POA: Diagnosis not present

## 2021-01-03 DIAGNOSIS — S72144A Nondisplaced intertrochanteric fracture of right femur, initial encounter for closed fracture: Secondary | ICD-10-CM | POA: Diagnosis not present

## 2021-01-03 DIAGNOSIS — I4891 Unspecified atrial fibrillation: Secondary | ICD-10-CM | POA: Diagnosis not present

## 2021-01-03 DIAGNOSIS — D631 Anemia in chronic kidney disease: Secondary | ICD-10-CM | POA: Diagnosis not present

## 2021-01-03 DIAGNOSIS — Z87820 Personal history of traumatic brain injury: Secondary | ICD-10-CM | POA: Diagnosis not present

## 2021-01-03 DIAGNOSIS — S72144D Nondisplaced intertrochanteric fracture of right femur, subsequent encounter for closed fracture with routine healing: Secondary | ICD-10-CM | POA: Diagnosis not present

## 2021-01-03 DIAGNOSIS — E1122 Type 2 diabetes mellitus with diabetic chronic kidney disease: Secondary | ICD-10-CM | POA: Diagnosis not present

## 2021-01-03 DIAGNOSIS — F0390 Unspecified dementia without behavioral disturbance: Secondary | ICD-10-CM | POA: Diagnosis not present

## 2021-01-03 DIAGNOSIS — I120 Hypertensive chronic kidney disease with stage 5 chronic kidney disease or end stage renal disease: Secondary | ICD-10-CM | POA: Diagnosis not present

## 2021-01-03 NOTE — Telephone Encounter (Signed)
Spoke with therapist. I think he was worn out from bus trip and OV here. She did not do therapy as his BP was low. Advised to let him rest remainder of the day.

## 2021-01-03 NOTE — Patient Instructions (Addendum)
Labs drawn and pending. Continue dialysis 3 days a week and home health services including PT. Has follow up soon with Neurologist. Needs to reschedule Neuropsychological testing when improved from femur fracture. CPE scheduled here for August. Suggest donut cushion when sitting and barrier cream for buttock area.

## 2021-01-03 NOTE — Telephone Encounter (Signed)
Mary from [facility] called said patient's BP is 90/42 HR is 60 and his O2 was nto reading. Patient is feeling very fatigued and dizzy.  [phone]

## 2021-01-03 NOTE — Progress Notes (Signed)
Subjective:    Patient ID: [name], male    DOB: 01-04-1941, 80 y.o.   MRN: [id number]  HPI 80 year old Male with ESRD on hemodialysis was admitted to [facility] and rehab facility in February after being admitted to [facility] February 14 through February 23 due to right intertrochanteric nondisplaced femur fracture status post fall.  He underwent right ORIF on February 15.  He is now at home receiving physical therapy services.  He received physical therapy while in the rehab facility.  He has a history of dementia and saw Dr. Sima Matas, neuropsychologist, on February 3.  He has history of memory issues for at least 6 years.  He had a subdural hematoma secondary to a fall in November 2019 which required evacuation.  Wife reported to Dr. Sima Matas that he sometimes has visual hallucinations.  It was plan for him to have neuropsychological testing but I think his fall has put this on hold for the present time.  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  He had a PET scan in November 2021 thought to be suggestive of Alzheimer's type pathology.  Other medical problems include BPH, peripheral vascular disease, hypothyroidism, hyperlipidemia, hypertension diabetes mellitus and chronic anemia.  Social history: He is married.  Wife is supportive..  Now receiving physical therapy services in the home.  Attends dialysis 3 days a week.  1 adopted daughter.   Review of Systems complaining of some irritation in his rectal area     Objective:   Physical Exam He arrived by handicap bus in a wheelchair with his wife.  Blood pressure 100/60 pulse 74 pulse oximetry 99%  Skin is warm and dry.  Chest clear to auscultation.  Cardiac exam regular rate and rhythm.  He has some mild erythema in the perirectal area but there is no  skin breakdown.  He is not alert to day of the week or year or month.  He is cooperative but sometimes irritable.       Assessment & Plan:  Status post nondisplaced femur fracture after a fall at home February 14.  Has been at [facility] and rehab and is now at home receiving home health services including physical therapy  End-stage renal disease requiring dialysis 3 times a week.  His baseline creatinine currently is around 8  Hypothyroidism treated with thyroid replacement  Hyperlipidemia treated with statin medication  Hypertension  BPH  ED reflux treated with omeprazole  Peripheral vascular disease  Albumin was low during his hospitalization at 2.1  Type 2 diabetes mellitus-seen by Dr. Dwyane Dee.  His A1c February 15 was excellent at 4.9%.  Currently on Tradjenta and sliding scale Ella  Dementia suspected Alzheimer's is on Namenda and Aricept  Perirectal irritation-wife has barrier cream to use on that area and also I recommended he sit on a doughnut type inflatable  pillow  During his hospitalization he had sinus tachycardia that was asymptomatic.  He was started on low-dose beta-blocker.  If blood pressure continues to be low that will need to be discontinued.  Plan: He will continue his rehab at home with home health services including physical therapy.  There is [facility] several days a week and he has transportation to dialysis via the handicapped bus.  Labs drawn today include CBC with differential c-Met and BNP.  His physical exam and Medicare wellness visit are scheduled  here for August.  He has an appointment with [name], Neurologist in April and apparently needs to reschedule neuropsychological testing with Dr. Sima Matas.  Further recommendations may depend on results of his labs drawn today.

## 2021-01-04 ENCOUNTER — Telehealth: Payer: Self-pay | Admitting: Internal Medicine

## 2021-01-04 DIAGNOSIS — E785 Hyperlipidemia, unspecified: Secondary | ICD-10-CM | POA: Diagnosis not present

## 2021-01-04 DIAGNOSIS — F0390 Unspecified dementia without behavioral disturbance: Secondary | ICD-10-CM | POA: Diagnosis not present

## 2021-01-04 DIAGNOSIS — N186 End stage renal disease: Secondary | ICD-10-CM | POA: Diagnosis not present

## 2021-01-04 DIAGNOSIS — S72144D Nondisplaced intertrochanteric fracture of right femur, subsequent encounter for closed fracture with routine healing: Secondary | ICD-10-CM | POA: Diagnosis not present

## 2021-01-04 DIAGNOSIS — E1122 Type 2 diabetes mellitus with diabetic chronic kidney disease: Secondary | ICD-10-CM | POA: Diagnosis not present

## 2021-01-04 DIAGNOSIS — I120 Hypertensive chronic kidney disease with stage 5 chronic kidney disease or end stage renal disease: Secondary | ICD-10-CM | POA: Diagnosis not present

## 2021-01-04 DIAGNOSIS — D509 Iron deficiency anemia, unspecified: Secondary | ICD-10-CM | POA: Diagnosis not present

## 2021-01-04 DIAGNOSIS — Z992 Dependence on renal dialysis: Secondary | ICD-10-CM | POA: Diagnosis not present

## 2021-01-04 DIAGNOSIS — E876 Hypokalemia: Secondary | ICD-10-CM | POA: Diagnosis not present

## 2021-01-04 DIAGNOSIS — N2581 Secondary hyperparathyroidism of renal origin: Secondary | ICD-10-CM | POA: Diagnosis not present

## 2021-01-04 NOTE — Telephone Encounter (Signed)
Marzetta Board - Physical Therapist - [facility] [phone] - Secure Cell okay to leave messages  Marzetta Board called to see if there was anything else they could do for Mr Dutch's bottom where it is red, no open wounds. They are putting the Desitin cream on it and wife picked up a donut pillow.

## 2021-01-04 NOTE — Telephone Encounter (Signed)
Called and let Marzetta Board know, she verbalized understanding.

## 2021-01-04 NOTE — Telephone Encounter (Signed)
[facility] Orders to [facility] [phone], phone [phone].  Order # [id number]  4/52022 to 02/24/2021

## 2021-01-04 NOTE — Telephone Encounter (Signed)
Please call [name]. I do not know of anything else except to try the donut and make sure the cream is applied frequently. I told [name] this yesterday when I saw him and I recommended the donut. If they have a wound care nurse, that would be fine to have them see him. Tell her his glucose was 48 yesterday (we drew blood and got results this morning) so that is probably why his BP was low yesterday afternoon. He had to wait here 40 minutes for the bus to pick him up according to [name].

## 2021-01-05 ENCOUNTER — Encounter: Payer: Self-pay | Admitting: *Deleted

## 2021-01-05 ENCOUNTER — Telehealth: Payer: Self-pay | Admitting: Internal Medicine

## 2021-01-05 ENCOUNTER — Other Ambulatory Visit: Payer: Self-pay | Admitting: *Deleted

## 2021-01-05 ENCOUNTER — Other Ambulatory Visit: Payer: Self-pay

## 2021-01-05 DIAGNOSIS — N186 End stage renal disease: Secondary | ICD-10-CM | POA: Diagnosis not present

## 2021-01-05 DIAGNOSIS — F0390 Unspecified dementia without behavioral disturbance: Secondary | ICD-10-CM | POA: Diagnosis not present

## 2021-01-05 DIAGNOSIS — E1122 Type 2 diabetes mellitus with diabetic chronic kidney disease: Secondary | ICD-10-CM | POA: Diagnosis not present

## 2021-01-05 DIAGNOSIS — D631 Anemia in chronic kidney disease: Secondary | ICD-10-CM | POA: Diagnosis not present

## 2021-01-05 DIAGNOSIS — S72144D Nondisplaced intertrochanteric fracture of right femur, subsequent encounter for closed fracture with routine healing: Secondary | ICD-10-CM | POA: Diagnosis not present

## 2021-01-05 DIAGNOSIS — I120 Hypertensive chronic kidney disease with stage 5 chronic kidney disease or end stage renal disease: Secondary | ICD-10-CM | POA: Diagnosis not present

## 2021-01-05 LAB — COMPLETE METABOLIC PANEL WITH GFR
AG Ratio: 1.2 (calc) (ref 1.0–2.5)
ALT: 11 U/L (ref 9–46)
AST: 24 U/L (ref 10–35)
Albumin: 2.8 g/dL — ABNORMAL LOW (ref 3.6–5.1)
Alkaline phosphatase (APISO): 151 U/L — ABNORMAL HIGH (ref 35–144)
BUN/Creatinine Ratio: 4 (calc) — ABNORMAL LOW (ref 6–22)
BUN: 21 mg/dL (ref 7–25)
CO2: 28 mmol/L (ref 20–32)
Calcium: 9.3 mg/dL (ref 8.6–10.3)
Chloride: 100 mmol/L (ref 98–110)
Creat: 5.05 mg/dL — ABNORMAL HIGH (ref 0.70–1.18)
GFR, Est African American: 12 mL/min/{1.73_m2} — ABNORMAL LOW (ref >=60)
GFR, Est Non African American: 10 mL/min/{1.73_m2} — ABNORMAL LOW (ref >=60)
Globulin: 2.4 g/dL (calc) (ref 1.9–3.7)
Glucose, Bld: 48 mg/dL — ABNORMAL LOW (ref 65–99)
Potassium: 3.3 mmol/L — ABNORMAL LOW (ref 3.5–5.3)
Sodium: 143 mmol/L (ref 135–146)
Total Bilirubin: 0.4 mg/dL (ref 0.2–1.2)
Total Protein: 5.2 g/dL — ABNORMAL LOW (ref 6.1–8.1)

## 2021-01-05 LAB — CBC WITH DIFFERENTIAL/PLATELET

## 2021-01-05 LAB — BRAIN NATRIURETIC PEPTIDE: Brain Natriuretic Peptide: 225 pg/mL — ABNORMAL HIGH (ref <100)

## 2021-01-05 NOTE — Telephone Encounter (Signed)
   Telephone encounter was:  Successful.  01/05/2021 Name: [name] MRN: [id number] DOB: April 03, 1941  [name] is a 80 y.o. year old male who is a primary care patient of Baxley, Cresenciano Lick, MD . The community resource team was consulted for assistance with Transportation Needs   Care guide performed the following interventions: spoke to pt's wife after hearing form the [employer] folks and they are set up for transportation moving forward. If they need transportation that [place] will not take them I advised pt to call the doctor for a new referral or call me and we can set up transportation through [facility]. .  Follow Up Plan:  No further follow up planned at this time. The patient has been provided with needed resources.  [place], Care Management Phone: [phone] Email: [email or web address]

## 2021-01-05 NOTE — Telephone Encounter (Signed)
Will Schala - Occupational Therapist [facility]  Will called to report Bill vitals   102/42

## 2021-01-05 NOTE — Telephone Encounter (Signed)
Tried to call home phone did not get an answer, so I called Shirley;s cell phone and let VM about metoprolol and ask her to call back to confirm she got the message.

## 2021-01-05 NOTE — Patient Outreach (Signed)
[facility]) Care Management  [name]  01/05/2021   [name] 06-Nov-1940 [id number]  Subjective: Telephone outreach to follow up on fall recovery and chronic disease.  Objective: FBS today 89                    Wt: 158#  Encounter Medications:  Outpatient Encounter Medications as of 01/05/2021  Medication Sig  . acetaminophen (TYLENOL) 325 MG tablet Take 1-2 tablets (325-650 mg total) by mouth every 4 (four) hours as needed for mild pain. (Patient taking differently: Take 650 mg by mouth in the morning and at bedtime.)  . albuterol (VENTOLIN HFA) 108 (90 Base) MCG/ACT inhaler Inhale 1 puff into the lungs daily as needed for wheezing or shortness of breath.  . allopurinol (ZYLOPRIM) 100 MG tablet Take 100 mg by mouth daily.  Marland Kitchen aspirin EC 325 MG tablet Take 1 tablet (325 mg total) by mouth daily.  . Blood Glucose Monitoring Suppl (FREESTYLE FREEDOM LITE) w/Device KIT Use to check blood sugar 2 times per day dx code E11.65  . camphor-menthol (SARNA) lotion Apply 1 application topically 4 (four) times daily as needed for itching.   . Carboxymethylcellulose Sodium (THERATEARS) 0.25 % SOLN Place 1 drop into both eyes as needed (for dryness).  . cetirizine (ZYRTEC) 10 MG chewable tablet Chew 10 mg by mouth every Monday, Wednesday, and Friday.  . clobetasol (TEMOVATE) 0.05 % external solution Apply 1 application topically 2 (two) times daily as needed (scalp irritation.).   Marland Kitchen denosumab (PROLIA) 60 MG/ML SOSY injection Inject 60 mg into the skin every 6 (six) months.  . docusate sodium (COLACE) 100 MG capsule Take 1 capsule (100 mg total) by mouth 2 (two) times daily.  Marland Kitchen donepezil (ARICEPT) 10 MG tablet Take 1 tablet (10 mg total) by mouth at bedtime.  Marland Kitchen FLUOCINOLONE ACETONIDE SCALP 0.01 % OIL Apply 1 application topically daily as needed (scalp irritation.).   Marland Kitchen fluticasone (FLONASE) 50 MCG/ACT nasal spray Place 1 spray into both nostrils daily. (Patient taking  differently: Place 1 spray into both nostrils daily as needed for allergies.)  . glucose blood (FREESTYLE LITE) test strip USE THREE TIMES DAILY  . hydrocortisone 2.5 % cream Apply 1 application topically 3 (three) times daily as needed (skin irritation (legs & arms)).   Marland Kitchen insulin aspart (NOVOLOG FLEXPEN) 100 UNIT/ML FlexPen INJECT 6-8 UNITS UNDER THE SKIN THREE TIMES DAILY BEFORE MEALS. DX:E11.65 (Patient taking differently: Inject 6-8 Units into the skin See admin instructions. Inject 6-8 units into the skin three times a day with meals, PER SLIDING SCALE)  . Insulin Pen Needle (PEN NEEDLES) 32G X 4 MM MISC 1 each by Does not apply route 3 (three) times daily. USE PEN NEEDLES TO INJECT INSULIN THREE TIMES DAILY.  Marland Kitchen ketoconazole (NIZORAL) 2 % shampoo Apply 1 application topically 2 (two) times a week. (Patient taking differently: Apply 1 application topically daily as needed for irritation (SHAMPOO).)  . Lancets (FREESTYLE) lancets Use as instructed to check blood sugar 2 times per day dx code E11.65  . levothyroxine (SYNTHROID, LEVOTHROID) 50 MCG tablet Take 1 tablet (50 mcg total) by mouth daily. (Patient taking differently: Take 50 mcg by mouth daily before breakfast.)  . lidocaine (LIDODERM) 5 % Place 1 patch onto the skin daily as needed (back pain).   . memantine (NAMENDA) 10 MG tablet Take 1 tablet (10 mg total) by mouth 2 (two) times daily.  . metoprolol tartrate (LOPRESSOR) 25 MG tablet Take  0.5 tablets (12.5 mg total) by mouth 2 (two) times daily.  . midodrine (PROAMATINE) 10 MG tablet Take one pill prior to hemodialysis on MWF (Patient taking differently: Take 10 mg by mouth See admin instructions. Take 10 mg by mouth prior to dialysis on Mon/Wed/Fri)  . Nutritional Supplements (FEEDING SUPPLEMENT, NEPRO CARB STEADY,) LIQD Take 237 mLs by mouth in the morning and at bedtime.   Marland Kitchen omeprazole (PRILOSEC) 40 MG capsule Take 1 capsule (40 mg total) by mouth daily. (Patient taking differently: Take  40 mg by mouth daily before breakfast.)  . sevelamer carbonate (RENVELA) 800 MG tablet Take 1,600-2,400 mg by mouth See admin instructions. Take 2,400 mg by mouth three times a day with meals and 1,600 mg with snacks on Sun/Tues/Thurs/Sat and 2,400 mg with breakfast and dinner and nothing with snacks on Mon/Wed/Fri  . simvastatin (ZOCOR) 20 MG tablet Take 20 mg by mouth at bedtime.  . TRADJENTA 5 MG TABS tablet TAKE 1 TABLET DAILY (Patient taking differently: Take 5 mg by mouth daily.)   No facility-administered encounter medications on file as of 01/05/2021.    Functional Status:  In your present state of health, do you have any difficulty performing the following activities: 01/05/2021 01/03/2021  Hearing? - N  Vision? N N  Difficulty concentrating or making decisions? [name]  Walking or climbing stairs? Y Y  Dressing or bathing? Y Y  Doing errands, shopping? [name]  Preparing Food and eating ? Y -  Using the Toilet? Y -  In the past six months, have you accidently leaked urine? Y -  Do you have problems with loss of bowel control? Y -  Managing your Medications? Y -  Managing your Finances? Y -  Housekeeping or managing your Housekeeping? Y -  Some recent data might be hidden    Fall/Depression Screening: Fall Risk  01/05/2021 01/03/2021 01/20/2019  Falls in the past year? _0 Comment - - -  Number falls in past yr: 1 0 0  Injury with Fall? 1 1 0  Risk for fall due to : History of fall(s);Impaired balance/gait;Impaired mobility;Medication side effect;Mental status change;Orthopedic patient - -  Follow up Falls evaluation completed;Education provided;Falls prevention discussed Falls evaluation completed Falls evaluation completed;Education provided   [facility] 2/9 Scores 01/05/2021 01/20/2019 10/09/2018 08/26/2018 08/29/2017 02/14/2015 02/14/2015  PHQ - 2 Score 6 0 2 2 0 0 0  PHQ- 9 Score 18 - - 14 - - -    Assessment: ESRD                        Dementia                        HIGH RISK for  falls, post fx                 NEW DX DEPRESSION - PHQ 18  Goals Addressed            This Visit's Progress   . Cope with Pain as evidenced by improved ability to move around by wife's report over the next 3 months.       Timeframe:  Long-Range Goal Priority:  High Start Date:      01/05/21                       Expected End Date:     04/21/21  Follow Up Date 02/20/21   - practice acceptance of back pain - practice relaxation or meditation daily - think of new ways to do favorite things - use distraction techniques    Why is this important?    Living with back pain and enjoying your life may be hard.   Feelings, such as depression or anger, can make your pain worse.   Learning ways to cope may help you find some relief from the pain.    Notes: 01/05/21 Wife reports pt back pain keeps him from doing everything in his PT sessions. He takes 1000 mg APAP am and PM. Recommended increasing to tid max dose. Also use topical diclofenac on a regular basis. Exercise on days that he does not have PT.    . Improve My Nutrition with the Foods I Eat and Drink as evidenced by minimal wt loss over the next 30 days.       Timeframe:  Short range Priority:  Medium Start Date:   01/05/21                          Expected End Date:    02/05/21           Follow Up Date 02/05/21   - choose small, frequent meals - drink protein shakes (sipping is ok) - fill half of plate with vegetables    Why is this important?    Eating 3 healthy meals, or 5 or 6 small meals, a day is very important.   A healthy diet is one that has fruit, vegetables, protein, good fats, whole grains and low-fat dairy products.   Be sure to have plenty of protein that builds strong muscles. Many older people do not get quite enough protein.     Notes: 01/05/21 Pt is ESRD on dialysis, protein must be limited but needs to have max allowed 60 Gms per day. Has had dietary counseling. Current wt per wife is 158.     . Monitor and Manage My Blood Sugar-Diabetes Type 2 by wife's report of FBS range over the next 3 months.       Timeframe:  Long-Range Goal Priority:  Medium Start Date:    01/05/21                         Expected End Date:   04/21/29                 Follow Up Date 02/05/21   - check blood sugar at prescribed times - take the blood sugar meter to all doctor visits    Why is this important?    Checking your blood sugar at home helps to keep it from getting very high or very low.   Writing the results in a diary or log helps the doctor know how to care for you.   Your blood sugar log should have the time, date and the results.   Also, write down the amount of insulin or other medicine that you take.   Other information, like what you ate, exercise done and how you were feeling, will also be helpful.     Notes: 01/05/21 Wife reports FBS today was 89. Usually between 70-90. Last HgbA1C was 5.3. He is on trajenta and lantus. Wife to make endocrinology appt to discuss discontinuation of one of these.    . Obtain Eye Exam-Diabetes Type 2 within the next 3 months per  report.       Follow Up Date 02/05/21   - schedule appointment with eye doctor    Why is this important?    Eye check-ups are important when you have diabetes.   Vision loss can be prevented.    Notes: 01/05/21 Had an appt scheduled but then had fall and fx. Wife to reschedule.       Plan:  Discussed end of life planning with Mr. And Mrs. with focus on quality of life vs. quantity, and likelihood of poor CPR success on older adults. Will verify HCPOA  And send MOST forms. Discussed possibility of palliative care in the near future.  Agreed to talk again in 2 weeks.    Eulah Pont. Myrtie Neither, MSN, [facility] Gerontological Nurse Practitioner [facility] Care Management [phone]

## 2021-01-05 NOTE — Telephone Encounter (Signed)
[name] called back, I let her know to stop giving Bill his Metoprolol since his blood pressure is low. She verbalized understanding.

## 2021-01-05 NOTE — Telephone Encounter (Signed)
   Telephone encounter was:  Successful.  01/05/2021 Name: [name] MRN: [id number] DOB: [birth year]  [name] is a 80 y.o. year old male who is a primary care patient of Baxley, Cresenciano Lick, MD . The community resource team was consulted for assistance with Transportation Needs   Care guide performed the following interventions: Follow up call placed to community resources to determine status of patients referral Follow up call placed to the patient to discuss status of referral spoke to pt's wife and she has heard from [facility] and I let her know that I have placed the applicaiton for the TAMS program to their department. I will follow up with her once I hear back for the [facility] director Callie Fielding.  Follow Up Plan:  Care guide will follow up with patient by phone over the next week   Lamar, Care Management Phone: [phone] Email: [email or web address]

## 2021-01-05 NOTE — Telephone Encounter (Signed)
Please call wife and ask her to stop metoprolol due to low BP.

## 2021-01-06 DIAGNOSIS — N2581 Secondary hyperparathyroidism of renal origin: Secondary | ICD-10-CM | POA: Diagnosis not present

## 2021-01-06 DIAGNOSIS — E876 Hypokalemia: Secondary | ICD-10-CM | POA: Diagnosis not present

## 2021-01-06 DIAGNOSIS — Z992 Dependence on renal dialysis: Secondary | ICD-10-CM | POA: Diagnosis not present

## 2021-01-06 DIAGNOSIS — N186 End stage renal disease: Secondary | ICD-10-CM | POA: Diagnosis not present

## 2021-01-06 DIAGNOSIS — D509 Iron deficiency anemia, unspecified: Secondary | ICD-10-CM | POA: Diagnosis not present

## 2021-01-06 DIAGNOSIS — E785 Hyperlipidemia, unspecified: Secondary | ICD-10-CM | POA: Diagnosis not present

## 2021-01-08 ENCOUNTER — Encounter: Payer: Self-pay | Admitting: Psychology

## 2021-01-08 NOTE — Progress Notes (Signed)
12/27/2020:  Today I provided feedback regarding the results of the recent neuropsychological evaluation.  I sat down with the patient and his family and reviewed the results.  The patient had severe cognitive deficits throughout the testing and identifying the particular etiological factor or factors will be difficult.  Clearly, the patient has had significant cerebrovascular events with frontal subdural hematomas of traumatic origin.  However, some of the patient's cognitive difficulties predated this most recent event.  He is now having visual and and tactile hallucinations but is unclear how much of this is due to other factors such as his occipital lobe changes, severe diabetes and end-stage renal disease producing significant neuropathy.  This pattern could also be consistent with a Lewy body dementia and/or combination of Alzheimer's with cerebrovascular involvement.  In any event, the patient is significantly and severely impaired and will need continued external support as the patient is currently unable to manage independently.

## 2021-01-08 NOTE — Progress Notes (Signed)
Neuropsychological Evaluation   Patient:  [name]   DOB: 09/22/1941  MR Number: [id number]  Location: [facility] PHYSICAL MEDICINE AND REHABILITATION [place], [place] [id number] [address] [place] [address] Dept: [phone]  Start: 8 AM End: 9 AM  Provider/Observer:     Johnny Roys PsyD  Chief Complaint:      Chief Complaint  Patient presents with  . Memory Loss  . Cerebrovascular Accident  . 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 Baxley, MD. Reports of significant cognitive changes and progressive memory loss triggered these referrals. Memory difficulties that were progressing date back sometime and the patient has a past medical history of traumatic bilateral subdural hematoma status post hematoma evacuation with chronic bilateral hematomas noted. The patient is described as having memory difficulties predating these traumatic subdural hematoma. The patient also has end-stage renal disease on hemodialysis, anemia, secondary hyperparathyroidism, hypertension with intermittent hypotension, dementia, multiple rib fractures, diabetes, hypothyroidism, B12 deficiency and sleep apnea.  The patient has an extensive and complicated past medical history. The patient has end-stage renal disease and is on hemodialysis. The patient also is insulin-dependent diabetic along with hyperlipidemia and essential hypertension. The patient has peripheral vascular disease has been diagnosed with obstructive sleep apnea and acute on chronic intracranial subdural hematoma. The patient has been diagnosed with an AVM of small small bowel and has a history of GI bleeding.  The  patient had a subdural hematoma on 07/29/2018 with subdural hematoma evacuation. The patient does have a history of depression and when he was first started to be seen by neurology 6 years ago because of memory changes there was report of increased anxiety and depression around his numerous medical issues. He was still active and engaging in life but frustrated by loss of functioning. Over the next several years he continued to have a history of mild cognitive difficulties and reports of memory problems. The patient was started on Aricept sometime around 2018 through the [place] without noted side effects. The patient was recommended to have neuropsychological testing conducted back in 2018 but I was not able to find any records related to previous testing.  During the clinical interview the patient minimized any cognitive difficulties and reported that he felt like "he was doing much better and improving." He minimized memory difficulties. However, the patient's wife reports that she is seeing increasing and significant memory issues. She reports that these memory difficulties particular around short-term memory and learning new information predated the traumatic subdural hematoma that he suffered. Patient is described as having a headache every day and that he takes [name] both in the morning and afternoon for his headaches and that these headaches are worsening. It is unclear whether these are related directly to his chronic subdural hematoma status but also could be related to rebound headaches from his persistent and regular use of [name]. Patient has been instructed to avoid aspirin due to previous GI bleeds and subdural hematoma and also to avoid ibuprofen type medications for both issues related to history of GI bleeds and also his end-stage renal disease.  The patient and his wife both deny any tremors. However, the patient is wife report  some visual hallucinations where he "sees bugs flying around both  during the day and at night." The patient's wife reported that these visual hallucinations started about 1 year ago and the patient is insistent on their validity. The patient also has developed concerns about bugs biting him as he has significant issues with itching. The patient has very poor sleep patterns and sometimes will not sleep at all at night. He will usually stay up till 3 or 4 AM due to itching and other difficulties. The patient's significant itching and other sensory changes could have a number of etiological factors including his end-stage renal disease and hemodialysis. The patient also has some fixation over other somatic complaints such as an oily substance developing on his skin and substances in his ears.  The patient has had a number of previous neuro imaging studies conducted. Most recent MRI was conducted on 08/06/2020 and interpretations made by Arlice Colt, MD. Impressions of this MRI included chronic subdural hematomas over the frontal convexities left greater than right that was seen is stable or mildly improving compared to a CT scan on 05/2020. There was also moderate generalized cortical atrophy noted and mild corpus callosum atrophy. Also noted was scattered T2 hyperintensities foci in the hemispheres and cerebellum consistent with mild chronic microvascular ischemic change.  Patient also had PET scan conducted on 08/23/2020 and interpreted by Johnny Bouchard, MD. Mild decreased relative cortical metabolism within the parietal lobes (left greater than right) was felt to be suggestive of Alzheimer's type pathology. There is also decreased relative cortical metabolism within the left occipital lobe of unclear etiology.  [name] completed 120 minutes of neuropsychological testing with this provider Health and safety inspector). The patient did not appear overtly distressed by the testing session, per behavioral observation or via self-report. Rest breaks were offered.    Tests Administered: Repeatable Battery for the Assessment of Neuropsychological Status, Form A (RBANS-A)  Test Results:   The patient was administered the RBANS neuropsychological test battery which is a repeatable battery that allows for following patients over time.  RBANS Update Form A Immediate Memory 40 RBANS Update Form A Visuospatial/ Constructional 50 RBANS Update Form A Language 54 RBANS Update Form A Attention 60 RBANS Update Form A Delayed Memory 40 RBANS Update Form A Total Scale 45   List Learning  1   Open Story Memory  1   Open Figure Copy  1   Open Semantic Fluency  2    Open Digit Span  7         Open Coding  1   Open Story Recall  2    Open Figure Recall  2   Summary of Results:   Overall, the results of the current neuropsychological evaluation are consistent with severe deficits.  There was no measure that was not significantly impaired although we did show his best performance with regard to auditory encoding ability and working memory.  However, the patient showed significant visual-spatial deficits, significant memory and learning deficits, significant verbal fluency deficits and significant information processing speed deficits.  Impression/Diagnosis:   The results of the current neuropsychological evaluation showed consistent and profound cognitive deficits on objective neuropsychological measures.  The patient shows only mild difficulties with auditory encoding which likely allow him to engage in conversational activities.  However, the patient shows severe deficits in his ability to organize his story information or have it available for later recall.  There are severe deficits with regard to visual-spatial abilities and visual constructional  abilities, information processing speed measures expressive language abilities.  The most profound deficits had to do with both immediate and delayed memory and learning.  As far as diagnostic considerations it is hard  to identify a clear in particular etiological factor.  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  PET scan indicates some mild relative cortical metabolism within bilateral parietal regions as well as left occipital lobe.  The patient also shows a number of abnormalities on his most recent MRI/CT scans showing subdural hematomas over the frontal convexities with left greater than right as well as moderate generalized cortical atrophy and mild corpus callosum atrophy.  The patient also showed patterns consistent with mild chronic microvascular ischemic changes.  The patient is having some types of visual and tactile hallucinations that would be consistent with Lewy body type dementia versus underlying Alzheimer's type dementia with superimposed vascular changes from previous cerebrovascular events in frontal regions and something going on in his occipital region and parietal regions.  The patient also end-stage renal disease and significant diabetic condition which may be playing a role peripheral neuropathy and changes in tactile sensation.  Again, it is going to be very difficult to identify a particular etiological factor in whether these are all related to his cerebrovascular events and progressive changes or an underlying Alzheimer/Lewy body type condition.  There does not appear to be significant subcortical involvement beyond mild microvascular ischemic changes in white matter and corpus callosum regions.  Diagnosis:    Dementia without behavioral disturbance, unspecified dementia type ([place])  Memory loss  History of traumatic brain injury   _____________________ Ilean Skill, Psy.D. Clinical Neuropsychologist

## 2021-01-09 DIAGNOSIS — N2581 Secondary hyperparathyroidism of renal origin: Secondary | ICD-10-CM | POA: Diagnosis not present

## 2021-01-09 DIAGNOSIS — E785 Hyperlipidemia, unspecified: Secondary | ICD-10-CM | POA: Diagnosis not present

## 2021-01-09 DIAGNOSIS — E876 Hypokalemia: Secondary | ICD-10-CM | POA: Diagnosis not present

## 2021-01-09 DIAGNOSIS — N186 End stage renal disease: Secondary | ICD-10-CM | POA: Diagnosis not present

## 2021-01-09 DIAGNOSIS — Z992 Dependence on renal dialysis: Secondary | ICD-10-CM | POA: Diagnosis not present

## 2021-01-09 DIAGNOSIS — D509 Iron deficiency anemia, unspecified: Secondary | ICD-10-CM | POA: Diagnosis not present

## 2021-01-09 NOTE — Progress Notes (Signed)
Many thanks. 

## 2021-01-10 ENCOUNTER — Telehealth: Payer: Self-pay | Admitting: Internal Medicine

## 2021-01-10 DIAGNOSIS — E1122 Type 2 diabetes mellitus with diabetic chronic kidney disease: Secondary | ICD-10-CM | POA: Diagnosis not present

## 2021-01-10 DIAGNOSIS — F0390 Unspecified dementia without behavioral disturbance: Secondary | ICD-10-CM | POA: Diagnosis not present

## 2021-01-10 DIAGNOSIS — S72144D Nondisplaced intertrochanteric fracture of right femur, subsequent encounter for closed fracture with routine healing: Secondary | ICD-10-CM | POA: Diagnosis not present

## 2021-01-10 DIAGNOSIS — N186 End stage renal disease: Secondary | ICD-10-CM | POA: Diagnosis not present

## 2021-01-10 DIAGNOSIS — D631 Anemia in chronic kidney disease: Secondary | ICD-10-CM | POA: Diagnosis not present

## 2021-01-10 DIAGNOSIS — I120 Hypertensive chronic kidney disease with stage 5 chronic kidney disease or end stage renal disease: Secondary | ICD-10-CM | POA: Diagnosis not present

## 2021-01-10 NOTE — Telephone Encounter (Signed)
Spoke with [name]. He has only had 2 sessions of OT. I am not sure if OT will do much good with his severe dementia. I just reviewed Dr. [name] note regarding his dementia. He will likely need skilled care in the future. I am not concerned about low diastolic BP. I am mostly concerned about  what systolic BP is doing and it seems fine to me with the readings she is giving. She tells me she is an Occupational psychologist and will speak with her supervisor who is an OT. Told her this small office is not staffed to handle lots of calls about this gentleman and he may need another medical provider that does in- home services. We discontinued metoprolol last week because of concerns she had  about BP then.

## 2021-01-10 NOTE — Telephone Encounter (Signed)
[name] with [facility] calling with concern. She said his bp medicine was recently discontinued and that they checked his bp at 10:42am 130/42 and they had him drink a little bit of juice and checked again and it was 126/34, He was saying he didn't feel right and had a little tremor, she said  He has eaten and is feeling better at the moment. His bllod sugar was 139 before checking his bp second time. She said that his wife said its not abnormal to gone this long before breakfest. Tammys callback if needed is [phone], She said she will be leaving there shortly

## 2021-01-11 DIAGNOSIS — D509 Iron deficiency anemia, unspecified: Secondary | ICD-10-CM | POA: Diagnosis not present

## 2021-01-11 DIAGNOSIS — N186 End stage renal disease: Secondary | ICD-10-CM | POA: Diagnosis not present

## 2021-01-11 DIAGNOSIS — E785 Hyperlipidemia, unspecified: Secondary | ICD-10-CM | POA: Diagnosis not present

## 2021-01-11 DIAGNOSIS — N2581 Secondary hyperparathyroidism of renal origin: Secondary | ICD-10-CM | POA: Diagnosis not present

## 2021-01-11 DIAGNOSIS — E876 Hypokalemia: Secondary | ICD-10-CM | POA: Diagnosis not present

## 2021-01-11 DIAGNOSIS — Z992 Dependence on renal dialysis: Secondary | ICD-10-CM | POA: Diagnosis not present

## 2021-01-11 DIAGNOSIS — E1129 Type 2 diabetes mellitus with other diabetic kidney complication: Secondary | ICD-10-CM | POA: Diagnosis not present

## 2021-01-12 ENCOUNTER — Emergency Department (HOSPITAL_COMMUNITY): Payer: Medicare Other

## 2021-01-12 ENCOUNTER — Emergency Department (HOSPITAL_COMMUNITY)
Admission: EM | Admit: 2021-01-12 | Discharge: 2021-01-12 | Disposition: A | Discharge status: Home / Self Care | Payer: Medicare Other | Attending: Emergency Medicine | Admitting: Emergency Medicine

## 2021-01-12 ENCOUNTER — Telehealth: Payer: Self-pay

## 2021-01-12 ENCOUNTER — Encounter (HOSPITAL_COMMUNITY): Payer: Self-pay

## 2021-01-12 DIAGNOSIS — E11649 Type 2 diabetes mellitus with hypoglycemia without coma: Secondary | ICD-10-CM | POA: Diagnosis not present

## 2021-01-12 DIAGNOSIS — Z794 Long term (current) use of insulin: Secondary | ICD-10-CM | POA: Diagnosis not present

## 2021-01-12 DIAGNOSIS — S72144D Nondisplaced intertrochanteric fracture of right femur, subsequent encounter for closed fracture with routine healing: Secondary | ICD-10-CM | POA: Diagnosis not present

## 2021-01-12 DIAGNOSIS — E039 Hypothyroidism, unspecified: Secondary | ICD-10-CM | POA: Insufficient documentation

## 2021-01-12 DIAGNOSIS — F0390 Unspecified dementia without behavioral disturbance: Secondary | ICD-10-CM | POA: Insufficient documentation

## 2021-01-12 DIAGNOSIS — R0602 Shortness of breath: Secondary | ICD-10-CM | POA: Insufficient documentation

## 2021-01-12 DIAGNOSIS — Z7982 Long term (current) use of aspirin: Secondary | ICD-10-CM | POA: Insufficient documentation

## 2021-01-12 DIAGNOSIS — Z7901 Long term (current) use of anticoagulants: Secondary | ICD-10-CM | POA: Diagnosis not present

## 2021-01-12 DIAGNOSIS — I480 Paroxysmal atrial fibrillation: Secondary | ICD-10-CM | POA: Diagnosis not present

## 2021-01-12 DIAGNOSIS — I120 Hypertensive chronic kidney disease with stage 5 chronic kidney disease or end stage renal disease: Secondary | ICD-10-CM | POA: Insufficient documentation

## 2021-01-12 DIAGNOSIS — N186 End stage renal disease: Secondary | ICD-10-CM | POA: Insufficient documentation

## 2021-01-12 DIAGNOSIS — R404 Transient alteration of awareness: Secondary | ICD-10-CM | POA: Diagnosis not present

## 2021-01-12 DIAGNOSIS — Z87891 Personal history of nicotine dependence: Secondary | ICD-10-CM | POA: Diagnosis not present

## 2021-01-12 DIAGNOSIS — I959 Hypotension, unspecified: Secondary | ICD-10-CM | POA: Diagnosis not present

## 2021-01-12 DIAGNOSIS — I700 Atherosclerosis of aorta: Secondary | ICD-10-CM | POA: Diagnosis not present

## 2021-01-12 DIAGNOSIS — Z79899 Other long term (current) drug therapy: Secondary | ICD-10-CM | POA: Insufficient documentation

## 2021-01-12 DIAGNOSIS — D631 Anemia in chronic kidney disease: Secondary | ICD-10-CM | POA: Diagnosis not present

## 2021-01-12 DIAGNOSIS — I2510 Atherosclerotic heart disease of native coronary artery without angina pectoris: Secondary | ICD-10-CM | POA: Diagnosis not present

## 2021-01-12 DIAGNOSIS — E1122 Type 2 diabetes mellitus with diabetic chronic kidney disease: Secondary | ICD-10-CM | POA: Diagnosis not present

## 2021-01-12 DIAGNOSIS — E162 Hypoglycemia, unspecified: Secondary | ICD-10-CM

## 2021-01-12 DIAGNOSIS — J9811 Atelectasis: Secondary | ICD-10-CM | POA: Diagnosis not present

## 2021-01-12 DIAGNOSIS — R000 Tachycardia, unspecified: Secondary | ICD-10-CM | POA: Diagnosis not present

## 2021-01-12 DIAGNOSIS — Z992 Dependence on renal dialysis: Secondary | ICD-10-CM | POA: Insufficient documentation

## 2021-01-12 LAB — PROTIME-INR
INR: 1 (ref 0.8–1.2)
Prothrombin Time: 13.3 seconds (ref 11.4–15.2)

## 2021-01-12 LAB — CBC WITH DIFFERENTIAL/PLATELET
Abs Immature Granulocytes: 0.04 10*3/uL (ref 0.00–0.07)
Basophils Absolute: 0.1 10*3/uL (ref 0.0–0.1)
Basophils Relative: 1 %
Eosinophils Absolute: 0.2 10*3/uL (ref 0.0–0.5)
Eosinophils Relative: 2 %
HCT: 32.6 % — ABNORMAL LOW (ref 39.0–52.0)
Hemoglobin: 10.2 g/dL — ABNORMAL LOW (ref 13.0–17.0)
Immature Granulocytes: 1 %
Lymphocytes Relative: 21 %
Lymphs Abs: 1.9 10*3/uL (ref 0.7–4.0)
MCH: 31.1 pg (ref 26.0–34.0)
MCHC: 31.3 g/dL (ref 30.0–36.0)
MCV: 99.4 fL (ref 80.0–100.0)
Monocytes Absolute: 0.7 10*3/uL (ref 0.1–1.0)
Monocytes Relative: 8 %
Neutro Abs: 5.8 10*3/uL (ref 1.7–7.7)
Neutrophils Relative %: 67 %
Platelets: 197 10*3/uL (ref 150–400)
RBC: 3.28 MIL/uL — ABNORMAL LOW (ref 4.22–5.81)
RDW: 18.2 % — ABNORMAL HIGH (ref 11.5–15.5)
WBC: 8.7 10*3/uL (ref 4.0–10.5)
nRBC: 0 % (ref 0.0–0.2)

## 2021-01-12 LAB — COMPREHENSIVE METABOLIC PANEL
ALT: 11 U/L (ref 0–44)
AST: 26 U/L (ref 15–41)
Albumin: 2.3 g/dL — ABNORMAL LOW (ref 3.5–5.0)
Alkaline Phosphatase: 143 U/L — ABNORMAL HIGH (ref 38–126)
Anion gap: 8 (ref 5–15)
BUN: 21 mg/dL (ref 8–23)
CO2: 34 mmol/L — ABNORMAL HIGH (ref 22–32)
Calcium: 8.8 mg/dL — ABNORMAL LOW (ref 8.9–10.3)
Chloride: 100 mmol/L (ref 98–111)
Creatinine, Ser: 5.45 mg/dL — ABNORMAL HIGH (ref 0.61–1.24)
GFR, Estimated: 10 mL/min — ABNORMAL LOW (ref >60)
Glucose, Bld: 58 mg/dL — ABNORMAL LOW (ref 70–99)
Potassium: 3.3 mmol/L — ABNORMAL LOW (ref 3.5–5.1)
Sodium: 142 mmol/L (ref 135–145)
Total Bilirubin: 0.4 mg/dL (ref 0.3–1.2)
Total Protein: 5.1 g/dL — ABNORMAL LOW (ref 6.5–8.1)

## 2021-01-12 LAB — CBG MONITORING, ED
Glucose-Capillary: 51 mg/dL — ABNORMAL LOW (ref 70–99)
Glucose-Capillary: 61 mg/dL — ABNORMAL LOW (ref 70–99)
Glucose-Capillary: 110 mg/dL — ABNORMAL HIGH (ref 70–99)

## 2021-01-12 LAB — BRAIN NATRIURETIC PEPTIDE: B Natriuretic Peptide: 329.7 pg/mL — ABNORMAL HIGH (ref 0.0–100.0)

## 2021-01-12 LAB — TROPONIN I (HIGH SENSITIVITY): Troponin I (High Sensitivity): 12 ng/L (ref <18)

## 2021-01-12 MED ORDER — DEXTROSE 50 % IV SOLN
25.0000 mL | Freq: Once | INTRAVENOUS | Status: AC
  Administered 2021-01-12: 25 mL via INTRAVENOUS
  Filled 2021-01-12: qty 50

## 2021-01-12 MED ORDER — APIXABAN 5 MG PO TABS
5.0000 mg | ORAL_TABLET | Freq: Once | ORAL | Status: AC
  Administered 2021-01-12: 5 mg via ORAL
  Filled 2021-01-12: qty 1

## 2021-01-12 MED ORDER — IOHEXOL 350 MG/ML SOLN
50.0000 mL | Freq: Once | INTRAVENOUS | Status: AC | PRN
  Administered 2021-01-12: 50 mL via INTRAVENOUS

## 2021-01-12 MED ORDER — APIXABAN 5 MG PO TABS: 5.0000 mg | ORAL_TABLET | Freq: Two times a day (BID) | ORAL | 0 refills | Status: DC

## 2021-01-12 MED ORDER — CHLORHEXIDINE GLUCONATE CLOTH 2 % EX PADS: 6.0000 | MEDICATED_PAD | Freq: Every day | CUTANEOUS | Status: DC

## 2021-01-12 NOTE — ED Notes (Addendum)
Have attempted to call wife and daughter x 2 for transportation home post discharge. Left VM message on daughter's phone, but spouse's phone does not have VM. Also no ring tone for Emergency contact.

## 2021-01-12 NOTE — ED Notes (Signed)
Spouse on her way to the hospital.

## 2021-01-12 NOTE — ED Triage Notes (Signed)
From home, dialysis patient, reports intermittent SOB started last night. Medic noted trigeminy and short run a-fib on monitor, both resolved. Dialysis yesterday

## 2021-01-12 NOTE — ED Provider Notes (Addendum)
  Physical Exam  BP (!) 129/41   Pulse 70   Temp 97.9 F (36.6 C) (Oral)   Resp (!) 22   SpO2 100%   Physical Exam  ED Course/Procedures     Procedures  MDM  Patient care assumed at 3 PM.  Patient is here with new onset A. fib.  Patient's heart rate is in the 70s and back in sinus rhythm.  He had a recent hip surgery and not anticoagulated so CTA is pending.  Patient's blood sugar slightly low so was given food and signout pending CTA result as well as repeat blood sugar  5:00 PM Patient is awake and alert and glucose is up to 110.  CTA did not show any PE. CHADVASC score is 4.  I discussed with the patient that he should get admitted to get an echo and observed.  Patient states that he does not want to stay in the hospital and wants to go home.  He states that he can get dialysis tomorrow.  Since his Chadvasc score is 4 he will need anticoagulation and I discussed with the pharmacist and he will qualify for Eliquis 5 mg twice daily.  We will have him follow-up with the afib clinic      Drenda Freeze, MD 01/12/21 1702    Drenda Freeze, MD 01/12/21 [phone]

## 2021-01-12 NOTE — ED Triage Notes (Signed)
CBG 84, BP 100/40, MAP 55-60, 96% RA.

## 2021-01-12 NOTE — Telephone Encounter (Signed)
I have called his wife. His BP is quite low, having difficulty breathing. I am concerned about sepsis or pneumonia. Blodd sugar has been up and down. Have advised transport to ED. She is going to call paramedics.

## 2021-01-12 NOTE — Telephone Encounter (Signed)
Patient's wife called patient continues to have low diastolic BP readings below 40 yesterday it was 90/40, today is 98/30. Patient is lethargic, and has difficulty reading. She wants to know if he can be referred to a cardiologist?

## 2021-01-12 NOTE — ED Provider Notes (Signed)
[facility] EMERGENCY DEPARTMENT Provider Note   CSN: [id number] Arrival date & time: 01/12/21  1347     History Chief Complaint  Patient presents with  . Shortness of Breath    [name] is a 80 y.o. male.  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  He did have dialysis yesterday.  According to the paramedics the patient had had several runs of rapid irregular arrhythmia in route to the hospital today, he did not have any idea that he had any kind of baseline arrhythmia, because of the discomfort that he has had with the shortness of breath over the last couple of days the paramedics were called but he did not initially want to come to the hospital as the SOB had gone away, ultimately because of these rapid tachycardic episodes he agreed to come.  He is currently living at home, he does not take any anticoagulants.  I have personally viewed the prehospital cardiac tracings and find there to be 1 tracing with sinus rhythm and the other with what appears to be atrial fibrillation with a rapid ventricular rate  Past Medical History:  Diagnosis Date  . Allergy   . Anemia   . Arthritis   . Cataract    bil cateracts removed  . Coronary artery disease   . Dementia arising in the senium and presenium ([place])   . Diabetes mellitus    Type 2  . Diverticulitis   . ED (erectile dysfunction)   . Elevated homocysteine   . ESRD (end stage renal disease) on dialysis ([place]) 03/2015  . GERD (gastroesophageal reflux disease)    pepto   . Gout   . Headache, unspecified 02/25/2015  . Hiatal hernia   . Hyperlipidemia   . Hypertension   . Hypothyroidism   . Pneumonia   . PVD (peripheral vascular disease) ([place])    has plastic aorta  . Renal insufficiency   . Seasonal allergies   . Sleep apnea    does not wear  c-pap    Patient Active Problem List   Diagnosis Date Noted  . Senile nuclear sclerosis 11/22/2020  . Other specified erythematous condition 11/22/2020  . Other specified disease of sebaceous glands 11/22/2020  . Osteoporosis 11/22/2020  . Obesity 11/22/2020  . Macrocytosis 11/22/2020  . Dystrophia unguium 11/22/2020  . Tinea pedis 11/22/2020  . Pre-operative clearance   . Closed nondisplaced intertrochanteric fracture of right femur ([place]) 11/07/2020  . Hip fracture ([place]) 11/07/2020  . Fall   . Rectal bleeding 04/22/2020  . AVM (arteriovenous malformation) of small bowel, acquired   . Hiatal hernia   . Schatzki's ring of distal esophagus   . Other bacterial infections of unspecified site 08/31/2019  . Hypocalcemia 04/06/2019  . Melena 11/19/2018  . Leukocytosis   . Anemia   . TBI (traumatic brain injury) ([place]) 07/29/2018  . 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 Loose stools   . Dementia arising in the senium and presenium ([place])   . 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 Multiple fractures of ribs, left side, initial encounter for closed fracture 07/21/2018  . Subdural hematoma ([place]) 07/21/2018  . Gastritis, unspecified, with bleeding 06/04/2018  . Angiodysplasia of stomach   . Occult GI bleeding   .  GIB (gastrointestinal bleeding) 06/01/2018  . Low back pain 01/17/2018  . Anorexia   . Diverticulitis of intestine, part unspecified, without perforation or abscess without bleeding 08/07/2017  . Dyspnea   . Macrocytic anemia 06/04/2016  . Thrombocytopenia ([place]) 06/04/2016  . Exertional dyspnea 06/04/2016  . Arm paresthesia, left 06/04/2016  . Dyspnea on exertion 06/04/2016  . Tenderness of right calf 06/04/2016  . Mild cognitive impairment with memory loss 01/12/2016  . ESRD on dialysis ([place]) 05/17/2015  . Hyperlipidemia, unspecified 02/25/2015  . Pruritus, unspecified 02/25/2015  . Pain, unspecified 02/25/2015  . Other specified  coagulation defects ([place]) 02/25/2015  . Iron deficiency anemia, unspecified 02/25/2015  . Headache, unspecified 02/25/2015  . Gout, unspecified 02/25/2015  . Fever, unspecified 02/25/2015  . Diarrhea, unspecified 02/25/2015  . Anemia in chronic kidney disease 02/25/2015  . Acute embolism and thrombosis of left subclavian vein ([place]) 02/25/2015  . Hypoglycemia   . Weakness 02/18/2015  . OSA (obstructive sleep apnea) 02/02/2015  . Hyperkalemia 10/05/2014  . End stage renal disease ([place]) 11/18/2013  . BPH (benign prostatic hyperplasia) 11/22/2012  . Peripheral vascular disease ([place]) 12/24/2011  . Hyperparathyroidism ([place]) 12/24/2011  . Hypothyroidism 12/24/2011  . Allergic rhinitis 12/24/2011  . Erectile dysfunction 12/24/2011  . Insulin dependent diabetes mellitus 09/03/2008  . Essential hypertension 08/30/2008  . PANCREATITIS, HX OF 08/30/2008  . RENAL FAILURE, ACUTE, HX OF 08/30/2008  . DIVERTICULOSIS, COLON 06/08/2003    Past Surgical History:  Procedure Laterality Date  . aortobifemoral bypass    . AV FISTULA PLACEMENT Left 12/01/2013   Procedure: ARTERIOVENOUS (AV) FISTULA CREATION- LEFT BRACHIOCEPHALIC;  Surgeon: Angelia Mould, MD;  Location: [place];  Service: Vascular;  Laterality: Left;  . [place] TRANSPOSITION Right 07/27/2014   Procedure: BASCILIC VEIN TRANSPOSITION;  Surgeon: Angelia Mould, MD;  Location: [place];  Service: Vascular;  Laterality: Right;  . BRAIN SURGERY  07/22/2018  . BREAST SURGERY     left - granulomatous mastitis  . BURR HOLE Bilateral 07/22/2018   Procedure: BILATERAL BURR HOLES;  Surgeon: Kristeen Miss, MD;  Location: [place];  Service: Neurosurgery;  Laterality: Bilateral;  . COLONOSCOPY    . 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 ENTEROSCOPY N/A 06/03/2018   Procedure: ENTEROSCOPY;  Surgeon: Lavena Bullion, DO;  Location: [place];  Service: Gastroenterology;  Laterality: N/A;  . ENTEROSCOPY N/A 11/20/2018    Procedure: ENTEROSCOPY;  Surgeon: Rush Landmark Telford Nab., MD;  Location: [facility] ENDOSCOPY;  Service: Gastroenterology;  Laterality: N/A;  . ENTEROSCOPY N/A 12/03/2019   Procedure: ENTEROSCOPY;  Surgeon: Lavena Bullion, DO;  Location: [facility] ENDOSCOPY;  Service: Gastroenterology;  Laterality: N/A;  push enteroscopy  . EYE SURGERY Bilateral    cataracts  . HEMODIALYSIS INPATIENT  01/17/2018      . HOT HEMOSTASIS N/A 06/03/2018   Procedure: HOT HEMOSTASIS (ARGON PLASMA COAGULATION/BICAP);  Surgeon: Lavena Bullion, DO;  Location[facility] ENDOSCOPY;  Service: Gastroenterology;  Laterality: N/A;  . HOT HEMOSTASIS N/A 11/20/2018   Procedure: HOT HEMOSTASIS (ARGON PLASMA COAGULATION/BICAP);  Surgeon: Irving Copas., MD;  Location: [place];  Service: Gastroenterology;  Laterality: N/A;  . HOT HEMOSTASIS N/A 12/03/2019   Procedure: HOT HEMOSTASIS (ARGON PLASMA COAGULATION/BICAP);  Surgeon: Lavena Bullion, DO;  Location: [facility] ENDOSCOPY;  Service: Gastroenterology;  Laterality: N/A;  . INTRAMEDULLARY (IM) NAIL INTERTROCHANTERIC Right 11/08/2020   Procedure: INTRAMEDULLARY (IM) NAIL INTERTROCHANTRIC;  Surgeon: Erle Crocker, MD;  Location: [place];  Service: Orthopedics;  Laterality: Right;  . REVISION OF ARTERIOVENOUS GORETEX GRAFT Right 09/01/2019   Procedure: REVISION OF ARTERIOVENOUS FISTULA RIGHT ARM;  Surgeon: Serafina Mitchell, MD;  Location: [facility] OR;  Service: Vascular;  Laterality: Right;  . REVISON OF ARTERIOVENOUS FISTULA Left 02/09/2014   Procedure: REVISON OF LEFT ARTERIOVENOUS FISTULA - RESECTION OF RENDUNDANT VEIN;  Surgeon: Angelia Mould, MD;  Location: [place];  Service: Vascular;  Laterality: Left;  . SBO with lysis adhesions    . SHUNTOGRAM Left 04/19/2014   Procedure: FISTULOGRAM;  Surgeon: Angelia Mould, MD;  Location: [facility] CATH LAB;  Service: Cardiovascular;  Laterality: Left;  . UNILATERAL UPPER EXTREMEITY ANGIOGRAM N/A 07/12/2014   Procedure: UNILATERAL UPPER  Anselmo Rod;  Surgeon: Angelia Mould, MD;  Location: [facility] CATH LAB;  Service: Cardiovascular;  Laterality: N/A;       Family History  Problem Relation Age of Onset  . Aneurysm Mother        brain  . Heart disease Father   . Stroke Father   . Hypertension Father   . Diabetes Father   . Dementia Neg Hx   . Colon cancer Neg Hx   . Esophageal cancer Neg Hx   . Pancreatic cancer Neg Hx   . Prostate cancer Neg Hx   . Rectal cancer Neg Hx   . Stomach cancer Neg Hx     Social History   Tobacco Use  . Smoking status: Former Smoker    Quit date: 09/24/1994    Years since quitting: 26.3  . Smokeless tobacco: Never Used  Vaping Use  . Vaping Use: Never used  Substance Use Topics  . Alcohol use: No    Alcohol/week: 0.0 standard drinks    Comment: Quit Oct. 1977 ("somewhat heavy")  . Drug use: No    Home Medications Prior to Admission medications   Medication Sig Start Date End Date Taking? Authorizing Provider  apixaban (ELIQUIS) 5 MG TABS tablet Take 1 tablet (5 mg total) by mouth 2 (two) times daily. 01/12/21 02/11/21 Yes Drenda Freeze, MD  acetaminophen (TYLENOL) 325 MG tablet Take 1-2 tablets (325-650 mg total) by mouth every 4 (four) hours as needed for mild pain. Patient taking differently: Take 650 mg by mouth in the morning and at bedtime. 08/01/18   Love, Ivan Anchors, PA-C  albuterol (VENTOLIN HFA) 108 (90 Base) MCG/ACT inhaler Inhale 1 puff into the lungs daily as needed for wheezing or shortness of breath.    [provider]  allopurinol (ZYLOPRIM) 100 MG tablet Take 100 mg by mouth daily.    [provider]  aspirin EC 325 MG tablet Take 1 tablet (325 mg total) by mouth daily. 11/10/20 11/10/21  Barb Merino, MD  Blood Glucose Monitoring Suppl (FREESTYLE FREEDOM LITE) w/Device KIT Use to check blood sugar 2 times per day dx code E11.65 10/21/18   Elayne Snare, MD  camphor-menthol [facility]) lotion Apply 1 application topically 4 (four) times  daily as needed for itching.     [provider]  Carboxymethylcellulose Sodium (THERATEARS) 0.25 % SOLN Place 1 drop into both eyes as needed (for dryness).    [provider]  cetirizine (ZYRTEC) 10 MG chewable tablet Chew 10 mg by mouth every Monday, Wednesday, and Friday.    [provider]  clobetasol (TEMOVATE) 0.05 % external solution Apply 1 application topically 2 (two) times daily as needed (scalp irritation.).  04/19/15   [provider]  denosumab (PROLIA) 60 MG/ML SOSY injection Inject 60  mg into the skin every 6 (six) months.    [provider]  docusate sodium (COLACE) 100 MG capsule Take 1 capsule (100 mg total) by mouth 2 (two) times daily. 11/10/20   Barb Merino, MD  donepezil (ARICEPT) 10 MG tablet Take 1 tablet (10 mg total) by mouth at bedtime. 07/28/20   Melvenia Beam, MD  FLUOCINOLONE ACETONIDE SCALP 0.01 % OIL Apply 1 application topically daily as needed (scalp irritation.).  04/28/15   [provider]  fluticasone (FLONASE) 50 MCG/ACT nasal spray Place 1 spray into both nostrils daily. Patient taking differently: Place 1 spray into both nostrils daily as needed for allergies. 10/08/14   Mikhail, Velta Addison, DO  glucose blood (FREESTYLE LITE) test strip USE THREE TIMES DAILY 01/25/20   Elayne Snare, MD  hydrocortisone 2.5 % cream Apply 1 application topically 3 (three) times daily as needed (skin irritation (legs & arms)).  01/13/19   [provider]  insulin aspart (NOVOLOG FLEXPEN) 100 UNIT/ML FlexPen INJECT 6-8 UNITS UNDER THE SKIN THREE TIMES DAILY BEFORE MEALS. DX:E11.65 Patient taking differently: Inject 6-8 Units into the skin See admin instructions. Inject 6-8 units into the skin three times a day with meals, PER SLIDING SCALE 11/25/18   Elayne Snare, MD  Insulin Pen Needle (PEN NEEDLES) 32G X 4 MM MISC 1 each by Does not apply route 3 (three) times daily. USE PEN NEEDLES TO INJECT INSULIN THREE TIMES DAILY. 11/25/18    Elayne Snare, MD  ketoconazole (NIZORAL) 2 % shampoo Apply 1 application topically 2 (two) times a week. Patient taking differently: Apply 1 application topically daily as needed for irritation (SHAMPOO). 03/31/15   Elby Showers, MD  Lancets (FREESTYLE) lancets Use as instructed to check blood sugar 2 times per day dx code E11.65 02/22/16   Elayne Snare, MD  levothyroxine (SYNTHROID, LEVOTHROID) 50 MCG tablet Take 1 tablet (50 mcg total) by mouth daily. Patient taking differently: Take 50 mcg by mouth daily before breakfast. 08/06/14   Baxley, Cresenciano Lick, MD  lidocaine (LIDODERM) 5 % Place 1 patch onto the skin daily as needed (back pain).  11/12/19   [provider]  memantine (NAMENDA) 10 MG tablet Take 1 tablet (10 mg total) by mouth 2 (two) times daily. 07/28/20   Melvenia Beam, MD  midodrine (PROAMATINE) 10 MG tablet Take one pill prior to hemodialysis on MWF Patient taking differently: Take 10 mg by mouth See admin instructions. Take 10 mg by mouth prior to dialysis on Mon/Wed/Fri 08/13/18   Love, Ivan Anchors, PA-C  Nutritional Supplements (FEEDING SUPPLEMENT, NEPRO CARB STEADY,) LIQD Take 237 mLs by mouth in the morning and at bedtime.     [provider]  omeprazole (PRILOSEC) 40 MG capsule Take 1 capsule (40 mg total) by mouth daily. Patient taking differently: Take 40 mg by mouth daily before breakfast. 03/13/18   Esterwood, Amy S, PA-C  sevelamer carbonate (RENVELA) 800 MG tablet Take 1,600-2,400 mg by mouth See admin instructions. Take 2,400 mg by mouth three times a day with meals and 1,600 mg with snacks on Sun/Tues/Thurs/Sat and 2,400 mg with breakfast and dinner and nothing with snacks on Mon/Wed/Fri    [provider]  simvastatin (ZOCOR) 20 MG tablet Take 20 mg by mouth at bedtime.    [provider]  TRADJENTA 5 MG TABS tablet TAKE 1 TABLET DAILY Patient taking differently: Take 5 mg by mouth daily. 03/01/20   Elayne Snare, MD    Allergies  Ambien  [zolpidem tartrate] and Penicillins  Review of Systems   Review of Systems  All other systems reviewed and are negative.   Physical Exam Updated Vital Signs BP (!) 150/30 (BP Location: Left Arm)   Pulse 65   Temp 97.9 F (36.6 C) (Oral)   Resp 20   SpO2 100%   Physical Exam Vitals and nursing note reviewed.  Constitutional:      General: He is not in acute distress.    Appearance: He is well-developed.  HENT:     Head: Normocephalic and atraumatic.     Mouth/Throat:     Pharynx: No oropharyngeal exudate.  Eyes:     General: No scleral icterus.       Right eye: No discharge.        Left eye: No discharge.     Conjunctiva/sclera: Conjunctivae normal.     Pupils: Pupils are equal, round, and reactive to light.  Neck:     Thyroid: No thyromegaly.     Vascular: No JVD.  Cardiovascular:     Rate and Rhythm: Normal rate and regular rhythm.     Heart sounds: Normal heart sounds. No murmur heard. No friction rub. No gallop.   Pulmonary:     Effort: Pulmonary effort is normal. No respiratory distress.     Breath sounds: Normal breath sounds. No wheezing or rales.  Abdominal:     General: Bowel sounds are normal. There is no distension.     Palpations: Abdomen is soft. There is no mass.     Tenderness: There is no abdominal tenderness.  Musculoskeletal:        General: No tenderness. Normal range of motion.     Cervical back: Normal range of motion and neck supple.  Lymphadenopathy:     Cervical: No cervical adenopathy.  Skin:    General: Skin is warm and dry.     Findings: No erythema or rash.  Neurological:     Mental Status: He is alert.     Coordination: Coordination normal.  Psychiatric:        Behavior: Behavior normal.     ED Results / Procedures / Treatments   Labs (all labs ordered are listed, but only abnormal results are displayed) Labs Reviewed  CBC WITH DIFFERENTIAL/PLATELET - Abnormal; Notable for the following components:      Result Value   RBC  3.28 (*)    Hemoglobin 10.2 (*)    HCT 32.6 (*)    RDW 18.2 (*)    All other components within normal limits  COMPREHENSIVE METABOLIC PANEL - Abnormal; Notable for the following components:   Potassium 3.3 (*)    CO2 34 (*)    Glucose, Bld 58 (*)    Creatinine, Ser 5.45 (*)    Calcium 8.8 (*)    Total Protein 5.1 (*)    Albumin 2.3 (*)    Alkaline Phosphatase 143 (*)    GFR, Estimated 10 (*)    All other components within normal limits  BRAIN NATRIURETIC PEPTIDE - Abnormal; Notable for the following components:   B Natriuretic Peptide 329.7 (*)    All other components within normal limits  CBG MONITORING, ED - Abnormal; Notable for the following components:   Glucose-Capillary 51 (*)    All other components within normal limits  CBG MONITORING, ED - Abnormal; Notable for the following components:   Glucose-Capillary 61 (*)    All other components within normal limits  CBG MONITORING, ED - Abnormal;  Notable for the following components:   Glucose-Capillary 110 (*)    All other components within normal limits  PROTIME-INR  TROPONIN I (HIGH SENSITIVITY)    EKG EKG Interpretation  Date/Time:  Thursday January 12 2021 14:01:32 EDT Ventricular Rate:  73 PR Interval:  168 QRS Duration: 91 QT Interval:  421 QTC Calculation: 464 R Axis:   -26 Text Interpretation: Ectopic atrial rhythm Borderline left axis deviation Low voltage, precordial leads Probable anteroseptal infarct, old since last tracing no significant change Confirmed by Noemi Chapel [phone]) on 01/12/2021 2:07:54 PM   Radiology CT Angio Chest PE W and/or Wo Contrast  Result Date: 01/12/2021 CLINICAL DATA:  80 year old male with concern for pulmonary embolism. EXAM: CT ANGIOGRAPHY CHEST WITH CONTRAST TECHNIQUE: Multidetector CT imaging of the chest was performed using the standard protocol during bolus administration of intravenous contrast. Multiplanar CT image reconstructions and MIPs were obtained to evaluate the  vascular anatomy. CONTRAST:  88m OMNIPAQUE IOHEXOL 350 MG/ML SOLN COMPARISON:  Chest radiograph dated 01/12/2021 FINDINGS: Cardiovascular: There is no cardiomegaly or pericardial effusion. Advanced 3 vessel coronary vascular calcification. There is advanced atherosclerotic calcification of the thoracic aorta. No aneurysmal dilatation. No pulmonary artery embolus identified. Mediastinum/Nodes: No hilar or mediastinal adenopathy. Right hilar and mediastinal calcified granuloma. The esophagus is grossly unremarkable. No mediastinal fluid collection. Lungs/Pleura: Bibasilar linear atelectasis. Mild centrilobular emphysema. No focal consolidation, pleural effusion, or pneumothorax. Stable 7 mm ground-glass nodular density in the lingula (78/6) similar to the study of 2019, likely an area of scarring. The central airways are patent. Upper Abdomen: Atrophic kidneys. Several bilateral renal hypodense lesions are not characterized on this CT. Musculoskeletal: Osteopenia with degenerative changes of the spine. Mild compression fracture of superior endplate of T3, age indeterminate but new compared to the study of 2019. Correlation with point tenderness recommended. No retropulsion. Partially visualized age indeterminate compression fracture of superior endplate of L1 also new since the prior CT of 2019. There is a 3 mm retropulsion of the posterosuperior endplate. Review of the MIP images confirms the above findings. IMPRESSION: 1. No acute intrathoracic pathology. No CT evidence of pulmonary artery embolus. 2. Mild compression fractures of the superior endplates of T3 and L1, age indeterminate but new compared to the study of 2019. Correlation with point tenderness recommended. No retropulsion. 3. Aortic Atherosclerosis (ICD10-I70.0) and Emphysema (ICD10-J43.9). Electronically Signed   By: AAnner CreteM.D.   On: 01/12/2021 15:19   DG Chest Port 1 View  Result Date: 01/12/2021 CLINICAL DATA:  Shortness of breath.  EXAM: PORTABLE CHEST 1 VIEW COMPARISON:  11/07/2020 FINDINGS: The cardiac silhouette, mediastinal and hilar contours are within normal limits. There is tortuosity and calcification of the thoracic aorta. No acute pulmonary findings. No infiltrates, edema or effusions. No pulmonary lesions. The bony thorax is intact. IMPRESSION: No acute cardiopulmonary findings. Electronically Signed   By: PMarijo SanesM.D.   On: 01/12/2021 14:45    Procedures Procedures   Medications Ordered in ED Medications  dextrose 50 % solution 25 mL (25 mLs Intravenous Given 01/12/21 1523)  iohexol (OMNIPAQUE) 350 MG/ML injection 50 mL (50 mLs Intravenous Contrast Given 01/12/21 1455)  apixaban (ELIQUIS) tablet 5 mg (5 mg Oral Given 01/12/21 1751)    ED Course  I have reviewed the triage vital signs and the nursing notes.  Pertinent labs & imaging results that were available during my care of the patient were reviewed by me and considered in my medical decision making (see chart for details).  MDM Rules/Calculators/A&P                          This patient appears to have no abnormal lung findings, he has not been febrile or coughing, his EKG shows normal sinus rhythm without any signs of arrhythmia or ischemia.  He does have poor R wave progression.  Thankfully we do have the prehospital tracings which show that he is having runs of atrial fibrillation which makes me wonder if this is why he is getting short of breath.  Given his age and his prior peripheral vascular disease history, including aortic repair he will likely need observation, he may need anticoagulation.  Of note the patient recently had his hip repaired after suffering a fracture with a fall, I would also consider pulmonary embolism to be a potential cause of the patient's shortness of breath and arrhythmia, CT angio will be ordered.  At change of shift - care signed out to Dr. Darl Householder to f/u results and dispostion accordingly  Final Clinical  Impression(s) / ED Diagnoses Final diagnoses:  Hypoglycemia  Paroxysmal atrial fibrillation (HCC)  SOB (shortness of breath)    Rx / DC Orders ED Discharge Orders         Ordered    apixaban (ELIQUIS) 5 MG TABS tablet  2 times daily        01/12/21 1703           Noemi Chapel, MD 01/13/21 1609

## 2021-01-12 NOTE — Discharge Instructions (Addendum)
You have atrial fibrillation.  You need to take Eliquis 5 mg twice daily  Please call cardiology tomorrow to get an appointment in a week  Continue your other meds and please go to dialysis tomorrow.  Return to ER if you have worsening chest pain or shortness of breath or palpitations or if your blood sugar is less than 60.

## 2021-01-13 ENCOUNTER — Ambulatory Visit (INDEPENDENT_AMBULATORY_CARE_PROVIDER_SITE_OTHER): Payer: Medicare Other | Admitting: Internal Medicine

## 2021-01-13 ENCOUNTER — Other Ambulatory Visit: Payer: Self-pay

## 2021-01-13 DIAGNOSIS — E876 Hypokalemia: Secondary | ICD-10-CM | POA: Diagnosis not present

## 2021-01-13 DIAGNOSIS — E785 Hyperlipidemia, unspecified: Secondary | ICD-10-CM | POA: Diagnosis not present

## 2021-01-13 DIAGNOSIS — I4891 Unspecified atrial fibrillation: Secondary | ICD-10-CM

## 2021-01-13 DIAGNOSIS — D509 Iron deficiency anemia, unspecified: Secondary | ICD-10-CM | POA: Diagnosis not present

## 2021-01-13 DIAGNOSIS — I959 Hypotension, unspecified: Secondary | ICD-10-CM

## 2021-01-13 DIAGNOSIS — R7989 Other specified abnormal findings of blood chemistry: Secondary | ICD-10-CM

## 2021-01-13 DIAGNOSIS — N186 End stage renal disease: Secondary | ICD-10-CM | POA: Diagnosis not present

## 2021-01-13 DIAGNOSIS — N2581 Secondary hyperparathyroidism of renal origin: Secondary | ICD-10-CM | POA: Diagnosis not present

## 2021-01-13 DIAGNOSIS — F0280 Dementia in other diseases classified elsewhere without behavioral disturbance: Secondary | ICD-10-CM

## 2021-01-13 DIAGNOSIS — Z87820 Personal history of traumatic brain injury: Secondary | ICD-10-CM

## 2021-01-13 DIAGNOSIS — Z992 Dependence on renal dialysis: Secondary | ICD-10-CM

## 2021-01-13 NOTE — Patient Instructions (Signed)
Telephone call to patient's wife advising her of his dx of severe dementia.  Needs family present when sent to ED to discuss situation with ED physician as patient has dementia and cannot make decisions regarding admission for himself. Wife has POA.

## 2021-01-13 NOTE — Progress Notes (Addendum)
Subjective:    Patient ID: [name], male    DOB: 11-15-1940, 80 y.o.   MRN: [id number]  HPI 80 year old Male was seen in ED last evening and discharged home on Eliquis. Admission was offered by ED physician but patient declined.  He currently is at dialysis.  I requested to speak to his wife today regarding his medical condition.This is a telephone visit regarding [name]'s health and expectations going forward. [name] has dementia and is not able to comprehend the nature of this call. His wife has Medical laboratory scientific officer.   His wife is identified as [name] who is also a patient in this practice.  She is at her home and I am at my office.  She is agreeable to visit in this format today.    [name] was not feeling well yesterday she says when husband went to the emergency department.  She felt she was coming down with a cold and did not want to expose anyone so she stayed at home.  She was not aware of his evaluation going on in the emergency department until nurse called her and said that he was being discharged home and would need to see cardiologist regarding atrial fibrillation.  She did not realize that admission had been offered and patient had declined.  She reports that she has patient's Power of Attorney due to his severe dementia.  I reviewed with her Dr. Ferne [name] recent evaluation and the fact that patient does have severe dementia and has history of head trauma resulting in bilateral subdural hematomas and had hematoma evacuation.  He also has end-stage renal disease requiring hemodialysis, anemia, secondary hyperparathyroidism, hypertension with intermittent hypotension history of multiple rib fractures, diabetes, hypothyroidism, B12 deficiency and sleep apnea.  He has had a neurology evaluation by Dr. Jaynee [name].  Recently seen here in April 12 for follow-up from rehab facility [facility] (due to right intertrochanteric nondisplaced femur fracture after a fall at home.  He  was hospitalized February 14 through February 23 and underwent right ORIF on February 15.  Currently receiving physical therapy services but there have been some issues recently with his blood pressure and we have gotten a couple of phone calls about this.  Patient has denied chest pain or shortness of breath during physical therapy.  Has had not had any complaints at home according to his wife of chest pain or shortness of breath.  He has had no syncope.  However due to his severe dementia, he is not able to make decisions regarding his healthcare.  Discussed this with his wife today.  He should not be discharged from the emergency department in the future before his wife is consulted.  She has a good understanding of his medical issues.  She is willing to try to take care of him at home but I am not optimistic that this is going to work out very well due to his dementia and decreased mobility from his femur fracture.  I am concerned he may need to be admitted to a long-term healthcare facility in the near future.  I explained this to his wife today on the phone.    Review of Systems see above     Objective:   Physical Exam  Patient was not examined today.  This was a telephone call between myself as [name]'s primary care provider and [name]'s wife, [name].  I wanted to clarify that expectations regarding his recovery from his femur  fracture.  I wanted to review with her his medical issues including severe dementia and likelihood that that is not going to improve.  I wanted to clarify that she had his power of attorney which she says she does.  If he has transported to the emergency department in the future, hospital personnel needs to communicate with Mrs. [name] regarding his condition prior to any consideration of discharge as patient is not able to make decisions for himself due to severe dementia.  He was thought to have a short run of atrial fibs in the emergency department.  He is being  referred to the A. fib clinic upon recommendation from the emergency department.  He was placed on Eliquis.  I am concerned that he might not do well on Eliquis as he has a high risk fall.  He explained this to his wife as well.  Cardiologist can make final decision regarding his chronic anticoagulation.  There is some risk to having him on chronic anticoagulation as his ambulation is not good at this point he is requiring physical therapy.  He is unsteady on his feet.  He also continues to have episodic hypotension which is concerning and could lead to another fall.      Assessment & Plan:  High risk fall  Chronic anticoagulation due to recent onset of intermittent atrial fibrillation.  He is on Eliquis.  History of femur fracture  End-stage renal disease currently on hemodialysis  Severe dementia with history of bilateral subdural hematomas requiring evacuation  History of BPH  History of peripheral vascular disease  History of hyperlipidemia  History of hypertension  History of type 2 diabetes mellitus  History of chronic anemia  Hypothyroidism  Plan: We will continue with home health services and see how he does over the next week or 2.  I have spoken with his wife about possibly needing to place him in a long-term care facility.  He will remain on chronic anticoagulation for now but is a high risk fall.  He will see cardiologist in the near future regarding paroxysmal atrial fibrillation.  We have clarified that his wife has his power of attorney and needs to be making decisions for the patient.

## 2021-01-14 ENCOUNTER — Other Ambulatory Visit: Payer: Self-pay | Admitting: Endocrinology

## 2021-01-14 ENCOUNTER — Encounter: Payer: Self-pay | Admitting: Internal Medicine

## 2021-01-16 DIAGNOSIS — N186 End stage renal disease: Secondary | ICD-10-CM | POA: Diagnosis not present

## 2021-01-16 DIAGNOSIS — E785 Hyperlipidemia, unspecified: Secondary | ICD-10-CM | POA: Diagnosis not present

## 2021-01-16 DIAGNOSIS — N2581 Secondary hyperparathyroidism of renal origin: Secondary | ICD-10-CM | POA: Diagnosis not present

## 2021-01-16 DIAGNOSIS — D509 Iron deficiency anemia, unspecified: Secondary | ICD-10-CM | POA: Diagnosis not present

## 2021-01-16 DIAGNOSIS — E876 Hypokalemia: Secondary | ICD-10-CM | POA: Diagnosis not present

## 2021-01-16 DIAGNOSIS — Z992 Dependence on renal dialysis: Secondary | ICD-10-CM | POA: Diagnosis not present

## 2021-01-17 ENCOUNTER — Ambulatory Visit (HOSPITAL_COMMUNITY)
Admission: RE | Admit: 2021-01-17 | Discharge: 2021-01-17 | Disposition: A | Discharge status: Home / Self Care | Payer: Medicare Other | Source: Ambulatory Visit | Attending: Physician Assistant | Admitting: Physician Assistant

## 2021-01-17 ENCOUNTER — Encounter (HOSPITAL_COMMUNITY): Payer: Self-pay | Admitting: Physician Assistant

## 2021-01-17 ENCOUNTER — Other Ambulatory Visit: Payer: Self-pay

## 2021-01-17 VITALS — BP 124/50 | HR 82 | Ht 70.0 in | Wt 147.4 lb

## 2021-01-17 DIAGNOSIS — N186 End stage renal disease: Secondary | ICD-10-CM | POA: Insufficient documentation

## 2021-01-17 DIAGNOSIS — R002 Palpitations: Secondary | ICD-10-CM | POA: Diagnosis not present

## 2021-01-17 DIAGNOSIS — D631 Anemia in chronic kidney disease: Secondary | ICD-10-CM | POA: Diagnosis not present

## 2021-01-17 DIAGNOSIS — E1122 Type 2 diabetes mellitus with diabetic chronic kidney disease: Secondary | ICD-10-CM | POA: Insufficient documentation

## 2021-01-17 DIAGNOSIS — Z79899 Other long term (current) drug therapy: Secondary | ICD-10-CM | POA: Insufficient documentation

## 2021-01-17 DIAGNOSIS — Z880 Allergy status to penicillin: Secondary | ICD-10-CM | POA: Diagnosis not present

## 2021-01-17 DIAGNOSIS — Z794 Long term (current) use of insulin: Secondary | ICD-10-CM | POA: Insufficient documentation

## 2021-01-17 DIAGNOSIS — Z8249 Family history of ischemic heart disease and other diseases of the circulatory system: Secondary | ICD-10-CM | POA: Diagnosis not present

## 2021-01-17 DIAGNOSIS — Z888 Allergy status to other drugs, medicaments and biological substances status: Secondary | ICD-10-CM | POA: Insufficient documentation

## 2021-01-17 DIAGNOSIS — Z992 Dependence on renal dialysis: Secondary | ICD-10-CM | POA: Insufficient documentation

## 2021-01-17 DIAGNOSIS — Z79890 Hormone replacement therapy (postmenopausal): Secondary | ICD-10-CM | POA: Diagnosis not present

## 2021-01-17 DIAGNOSIS — I120 Hypertensive chronic kidney disease with stage 5 chronic kidney disease or end stage renal disease: Secondary | ICD-10-CM | POA: Insufficient documentation

## 2021-01-17 DIAGNOSIS — Z87891 Personal history of nicotine dependence: Secondary | ICD-10-CM | POA: Insufficient documentation

## 2021-01-17 DIAGNOSIS — I491 Atrial premature depolarization: Secondary | ICD-10-CM | POA: Diagnosis not present

## 2021-01-17 DIAGNOSIS — F0390 Unspecified dementia without behavioral disturbance: Secondary | ICD-10-CM | POA: Diagnosis not present

## 2021-01-17 DIAGNOSIS — E1140 Type 2 diabetes mellitus with diabetic neuropathy, unspecified: Secondary | ICD-10-CM | POA: Diagnosis not present

## 2021-01-17 DIAGNOSIS — Z833 Family history of diabetes mellitus: Secondary | ICD-10-CM | POA: Diagnosis not present

## 2021-01-17 DIAGNOSIS — E039 Hypothyroidism, unspecified: Secondary | ICD-10-CM | POA: Insufficient documentation

## 2021-01-17 DIAGNOSIS — S72144D Nondisplaced intertrochanteric fracture of right femur, subsequent encounter for closed fracture with routine healing: Secondary | ICD-10-CM | POA: Diagnosis not present

## 2021-01-17 NOTE — Patient Instructions (Signed)
Stop eliquis  Wear heart monitor for 2 weeks - take off May 10th

## 2021-01-17 NOTE — Progress Notes (Signed)
Thank you! This seems like a very reasonable plan to me.

## 2021-01-17 NOTE — Progress Notes (Signed)
Primary Care Physician: Elby Showers, MD Primary Cardiologist: Dr Gwenlyn Found Primary Electrophysiologist: none Referring Physician: [facility]   [name] is a 80 y.o. male with a history of severe dementia, ESRD-HD on MWF,  HTN, IDDM, diabetic neuropathy, hypothyroidism, gout, bilateral subdural hematomas requiring evacuation who presents for consultation in the [facility]. Patient has a CHADS2VASC score of 4. He was seen at the ED 01/12/21 with SOB. Per EMS report, he had several seconds of an irregular rhythm which were felt to be afib. On personal review, it appears more consistent with SR with PACs. ECG and telemetry at ED did not show any afib. Admission was offered but patient declined.   Today, patient is accompanied by wife who provides the history. Patient has done well since leaving the hospital with no further episodes of heart racing or SOB. He denies any bleeding issues on anticoagulation.   Today, he denies symptoms of chest pain, shortness of breath, orthopnea, PND, lower extremity edema, dizziness, presyncope, syncope, snoring, daytime somnolence, bleeding, or neurologic sequela. The patient is tolerating medications without difficulties and is otherwise without complaint today.    Atrial Fibrillation Risk Factors:  he does not have symptoms or diagnosis of sleep apnea. he does not have a history of rheumatic fever.   he has a BMI of Body mass index is 21.15 kg/m.Marland Kitchen Filed Weights   01/17/21 1337  Weight: 66.9 kg    Family History  Problem Relation Age of Onset  . Aneurysm Mother        brain  . Heart disease Father   . Stroke Father   . Hypertension Father   . Diabetes Father   . Dementia Neg Hx   . Colon cancer Neg Hx   . Esophageal cancer Neg Hx   . Pancreatic cancer Neg Hx   . Prostate cancer Neg Hx   . Rectal cancer Neg Hx   . Stomach cancer Neg Hx      Atrial Fibrillation Management history:  Previous  antiarrhythmic drugs: none Previous cardioversions: none Previous ablations: none CHADS2VASC score: 4 Anticoagulation history: Eliquis   Past Medical History:  Diagnosis Date  . Allergy   . Anemia   . Arthritis   . Cataract    bil cateracts removed  . Coronary artery disease   . Dementia arising in the senium and presenium ([place])   . Diabetes mellitus    Type 2  . Diverticulitis   . ED (erectile dysfunction)   . Elevated homocysteine   . ESRD (end stage renal disease) on dialysis ([place]) 03/2015  . GERD (gastroesophageal reflux disease)    pepto   . Gout   . Headache, unspecified 02/25/2015  . Hiatal hernia   . Hyperlipidemia   . Hypertension   . Hypothyroidism   . Pneumonia   . PVD (peripheral vascular disease) ([place])    has plastic aorta  . Renal insufficiency   . Seasonal allergies   . Sleep apnea    does not wear c-pap   Past Surgical History:  Procedure Laterality Date  . aortobifemoral bypass    . AV FISTULA PLACEMENT Left 12/01/2013   Procedure: ARTERIOVENOUS (AV) FISTULA CREATION- LEFT BRACHIOCEPHALIC;  Surgeon: Angelia Mould, MD;  Location: [place];  Service: Vascular;  Laterality: Left;  . [place] TRANSPOSITION Right 07/27/2014   Procedure: BASCILIC VEIN TRANSPOSITION;  Surgeon: Angelia Mould, MD;  Location: [place];  Service: Vascular;  Laterality: Right;  .  BRAIN SURGERY  07/22/2018  . BREAST SURGERY     left - granulomatous mastitis  . BURR HOLE Bilateral 07/22/2018   Procedure: BILATERAL BURR HOLES;  Surgeon: Kristeen Miss, MD;  Location: [place];  Service: Neurosurgery;  Laterality: Bilateral;  . COLONOSCOPY    . 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 ENTEROSCOPY N/A 06/03/2018   Procedure: ENTEROSCOPY;  Surgeon: Lavena Bullion, DO;  Location: [place];  Service: Gastroenterology;  Laterality: N/A;  . ENTEROSCOPY N/A 11/20/2018   Procedure: ENTEROSCOPY;  Surgeon: Rush Landmark Telford Nab., MD;  Location: [facility] ENDOSCOPY;   Service: Gastroenterology;  Laterality: N/A;  . ENTEROSCOPY N/A 12/03/2019   Procedure: ENTEROSCOPY;  Surgeon: Lavena Bullion, DO;  Location: [facility] ENDOSCOPY;  Service: Gastroenterology;  Laterality: N/A;  push enteroscopy  . EYE SURGERY Bilateral    cataracts  . HEMODIALYSIS INPATIENT  01/17/2018      . HOT HEMOSTASIS N/A 06/03/2018   Procedure: HOT HEMOSTASIS (ARGON PLASMA COAGULATION/BICAP);  Surgeon: Lavena Bullion, DO;  Location: [facility] ENDOSCOPY;  Service: Gastroenterology;  Laterality: N/A;  . HOT HEMOSTASIS N/A 11/20/2018   Procedure: HOT HEMOSTASIS (ARGON PLASMA COAGULATION/BICAP);  Surgeon: Irving Copas., MD;  Location: [place];  Service: Gastroenterology;  Laterality: N/A;  . HOT HEMOSTASIS N/A 12/03/2019   Procedure: HOT HEMOSTASIS (ARGON PLASMA COAGULATION/BICAP);  Surgeon: Lavena Bullion, DO;  Location: [facility]OS[facility];  Service: Gastroenterology;  Laterality: N/A;  . INTRAMEDULLARY (IM) NAIL INTERTROCHANTERIC Right 11/08/2020   Procedure: INTRAMEDULLARY (IM) NAIL INTERTROCHANTRIC;  Surgeon: Erle Crocker, MD;  Location: [place];  Service: Orthopedics;  Laterality: Right;  . REVISION OF ARTERIOVENOUS GORETEX GRAFT Right 09/01/2019   Procedure: REVISION OF ARTERIOVENOUS FISTULA RIGHT ARM;  Surgeon: Serafina Mitchell, MD;  Location: [facility];  Service: Vascular;  Laterality: Right;  . REVISON OF ARTERIOVENOUS FISTULA Left 02/09/2014   Procedure: REVISON OF LEFT ARTERIOVENOUS FISTULA - RESECTION OF RENDUNDANT VEIN;  Surgeon: Angelia Mould, MD;  Location: [place];  Service: Vascular;  Laterality: Left;  . SBO with lysis adhesions    . SHUNTOGRAM Left 04/19/2014   Procedure: FISTULOGRAM;  Surgeon: Angelia Mould, MD;  Location: [facility] CATH LAB;  Service: Cardiovascular;  Laterality: Left;  . UNILATERAL UPPER EXTREMEITY ANGIOGRAM N/A 07/12/2014   Procedure: UNILATERAL UPPER Anselmo Rod;  Surgeon: Angelia Mould, MD;  Location: [facility] CATH LAB;  Service:  Cardiovascular;  Laterality: N/A;    Current Outpatient Medications  Medication Sig Dispense Refill  . acetaminophen (TYLENOL) 325 MG tablet Take 1-2 tablets (325-650 mg total) by mouth every 4 (four) hours as needed for mild pain.    Marland Kitchen albuterol (VENTOLIN HFA) 108 (90 Base) MCG/ACT inhaler Inhale 1 puff into the lungs daily as needed for wheezing or shortness of breath.    . allopurinol (ZYLOPRIM) 100 MG tablet Take 100 mg by mouth daily.    Marland Kitchen apixaban (ELIQUIS) 5 MG TABS tablet Take 1 tablet (5 mg total) by mouth 2 (two) times daily. 60 tablet 0  . Blood Glucose Monitoring Suppl (FREESTYLE FREEDOM LITE) w/Device KIT Use to check blood sugar 2 times per day dx code E11.65 1 each 0  . camphor-menthol (SARNA) lotion Apply 1 application topically 4 (four) times daily as needed for itching.     . Carboxymethylcellulose Sodium (THERATEARS) 0.25 % SOLN Place 1 drop into both eyes as needed (for dryness).    . cetirizine (ZYRTEC) 10 MG chewable tablet Chew 10 mg by mouth every  Monday, Wednesday, and Friday.    . clobetasol (TEMOVATE) 0.05 % external solution Apply 1 application topically 2 (two) times daily as needed (scalp irritation.).   0  . denosumab (PROLIA) 60 MG/ML SOSY injection Inject 60 mg into the skin every 6 (six) months.    . donepezil (ARICEPT) 10 MG tablet Take 1 tablet (10 mg total) by mouth at bedtime. 90 tablet 3  . FLUOCINOLONE ACETONIDE SCALP 0.01 % OIL Apply 1 application topically daily as needed (scalp irritation.).   0  . fluticasone (FLONASE) 50 MCG/ACT nasal spray Place 1 spray into both nostrils daily. 16 g 2  . glucose blood (FREESTYLE LITE) test strip USE AS DIRECTED THREE TIMES DAILY 300 strip 2  . hydrocortisone 2.5 % cream Apply 1 application topically 3 (three) times daily as needed (skin irritation (legs & arms)).     Marland Kitchen insulin aspart (NOVOLOG FLEXPEN) 100 UNIT/ML FlexPen INJECT 6-8 UNITS UNDER THE SKIN THREE TIMES DAILY BEFORE MEALS. DX:E11.65 15 mL 12  . Insulin  Pen Needle (PEN NEEDLES) 32G X 4 MM MISC 1 each by Does not apply route 3 (three) times daily. USE PEN NEEDLES TO INJECT INSULIN THREE TIMES DAILY. 100 each 12  . ketoconazole (NIZORAL) 2 % shampoo Apply 1 application topically 2 (two) times a week. 120 mL 0  . Lancets (FREESTYLE) lancets Use as instructed to check blood sugar 2 times per day dx code E11.65 100 each 3  . levothyroxine (SYNTHROID, LEVOTHROID) 50 MCG tablet Take 1 tablet (50 mcg total) by mouth daily. 90 tablet 1  . lidocaine (LIDODERM) 5 % Place 1 patch onto the skin daily as needed (back pain).     . memantine (NAMENDA) 10 MG tablet Take 1 tablet (10 mg total) by mouth 2 (two) times daily. 180 tablet 3  . midodrine (PROAMATINE) 10 MG tablet Take one pill prior to hemodialysis on MWF    . Nutritional Supplements (FEEDING SUPPLEMENT, NEPRO CARB STEADY,) LIQD Take 237 mLs by mouth in the morning and at bedtime.     Marland Kitchen omeprazole (PRILOSEC) 40 MG capsule Take 1 capsule (40 mg total) by mouth daily. 30 capsule 0  . sevelamer carbonate (RENVELA) 800 MG tablet Take 1,600-2,400 mg by mouth See admin instructions. Take 2,400 mg by mouth three times a day with meals and 1,600 mg with snacks on Sun/Tues/Thurs/Sat and 2,400 mg with breakfast and dinner and nothing with snacks on Mon/Wed/Fri    . simvastatin (ZOCOR) 20 MG tablet Take 20 mg by mouth at bedtime.    . TRADJENTA 5 MG TABS tablet TAKE 1 TABLET DAILY 90 tablet 3   No current facility-administered medications for this encounter.    Allergies  Allergen Reactions  . Ambien [Zolpidem Tartrate] Other (See Comments)    Hallucinations and "felt crazy"   . Penicillins Rash and Hives    Has patient had a PCN reaction causing immediate rash, facial/tongue/throat swelling, SOB or lightheadedness with hypotension: Yes Has patient had a PCN reaction causing severe rash involving mucus membranes or skin necrosis: Yes Has patient had a PCN reaction that required hospitalization: No Has patient  had a PCN reaction occurring within the last 10 years: No If all of the above answers are "NO", then may proceed with Cephalosporin use.  Other reaction(s): HIVES    Social History   Socioeconomic History  . Marital status: Married    Spouse name: [name] Mood  . Number of children: 1  . Years of education: 65  .  Highest education level: Not on file  Occupational History  . Occupation: Retired  Tobacco Use  . Smoking status: Former Smoker    Quit date: 09/24/1994    Years since quitting: 26.3  . Smokeless tobacco: Never Used  Vaping Use  . Vaping Use: Never used  Substance and Sexual Activity  . Alcohol use: No    Alcohol/week: 0.0 standard drinks    Comment: Quit Oct. 1977 ("somewhat heavy")  . Drug use: No  . Sexual activity: Not on file  Other Topics Concern  . Not on file  Social History Narrative   Lives at home with wife.   Caffeine use: Drinks no tea, "a little soda"   Drinks 1 cup coffee/week   Right handed   V[employer]   Social Determinants of Health   Financial Resource Strain: Not on file  Food Insecurity: No Food Insecurity  . Worried About Charity fundraiser in the Last Year: Never true  . Ran Out of Food in the Last Year: Never true  Transportation Needs: No Transportation Needs  . Lack of Transportation (Medical): No  . Lack of Transportation (Non-Medical): No  Physical Activity: Not on file  Stress: Not on file  Social Connections: Not on file  Intimate Partner Violence: Not on file     ROS- All systems are reviewed and negative except as per the HPI above.  Physical Exam: Vitals:   01/17/21 1337  BP: (!) 124/50  Pulse: 82  Weight: 66.9 kg  Height: 5' 10"  (1.778 m)    GEN- The patient is a well appearing elderly male, alert but not oriented.   Head- normocephalic, atraumatic Eyes-  Sclera clear, conjunctiva pink Ears- hearing intact Oropharynx- clear Neck- supple  Lungs- Clear to ausculation bilaterally, normal work of breathing Heart-  Regular rate and rhythm, no murmurs, rubs or gallops  GI- soft, NT, ND, + BS Extremities- no clubbing, cyanosis, or edema MS- no significant deformity or atrophy Skin- no rash or lesion Psych- euthymic mood, full affect Neuro- strength and sensation are intact  Wt Readings from Last 3 Encounters:  01/17/21 66.9 kg  11/16/20 74.6 kg  08/11/20 74.5 kg    EKG today demonstrates  SR Vent. rate 82 BPM PR interval 150 ms QRS duration 76 ms QT/QTcB 382/446 ms  Echo 11/07/20 demonstrated  1. Left ventricular ejection fraction, by estimation, is 55 to 60%. The  left ventricle has normal function. The left ventricle has no regional  wall motion abnormalities. There is mild left ventricular hypertrophy.  Left ventricular diastolic parameters  are consistent with Grade I diastolic dysfunction (impaired relaxation).  2. Right ventricular systolic function is normal. The right ventricular  size is normal. There is mildly elevated pulmonary artery systolic  pressure. The estimated right ventricular systolic pressure is 54.0 mmHg.  3. The mitral valve is normal in structure. Trivial mitral valve  regurgitation. No evidence of mitral stenosis.  4. The aortic valve is tricuspid. Aortic valve regurgitation is not  visualized. Mild aortic valve sclerosis is present, with no evidence of  aortic valve stenosis.  5. The inferior vena cava is normal in size with greater than 50%  respiratory variability, suggesting right atrial pressure of 3 mmHg.   Epic records are reviewed at length today  CHA2DS2-VASc Score = 4  The patient's score is based upon: CHF History: No HTN History: Yes Diabetes History: Yes Stroke History: No Vascular Disease History: No Age Score: 2 Gender Score: 0  ASSESSMENT AND PLAN: 1. PACs/disorganized atrial activity  The patient's CHA2DS2-VASc score is 4, indicating a 4.8% annual risk of stroke. Reviewed ECG and rhythm strips from ED visit, no clear  afib. Will place Zio monitor for two weeks to evaluate for possible arrhythmias.  Given his h/o severe dementia, bilateral subdural hematomas, and no definitive diagnosis of afib, would not recommend anticoagulation at this point.   2. ESRD HD MWF  3. HTN Stable, no changes today.   Follow up pending results of heart monitor.    [facility] [address] [place], [place] [address] [phone] 01/17/2021 1:48 PM

## 2021-01-18 DIAGNOSIS — E785 Hyperlipidemia, unspecified: Secondary | ICD-10-CM | POA: Diagnosis not present

## 2021-01-18 DIAGNOSIS — N2581 Secondary hyperparathyroidism of renal origin: Secondary | ICD-10-CM | POA: Diagnosis not present

## 2021-01-18 DIAGNOSIS — N186 End stage renal disease: Secondary | ICD-10-CM | POA: Diagnosis not present

## 2021-01-18 DIAGNOSIS — D509 Iron deficiency anemia, unspecified: Secondary | ICD-10-CM | POA: Diagnosis not present

## 2021-01-18 DIAGNOSIS — E876 Hypokalemia: Secondary | ICD-10-CM | POA: Diagnosis not present

## 2021-01-18 DIAGNOSIS — Z992 Dependence on renal dialysis: Secondary | ICD-10-CM | POA: Diagnosis not present

## 2021-01-19 ENCOUNTER — Telehealth: Payer: Self-pay | Admitting: Internal Medicine

## 2021-01-19 ENCOUNTER — Other Ambulatory Visit: Payer: Self-pay

## 2021-01-19 ENCOUNTER — Encounter: Payer: Self-pay | Admitting: Neurology

## 2021-01-19 ENCOUNTER — Encounter: Payer: Self-pay | Admitting: Internal Medicine

## 2021-01-19 ENCOUNTER — Other Ambulatory Visit: Payer: Self-pay | Admitting: *Deleted

## 2021-01-19 ENCOUNTER — Ambulatory Visit (INDEPENDENT_AMBULATORY_CARE_PROVIDER_SITE_OTHER): Payer: Medicare Other | Admitting: Neurology

## 2021-01-19 VITALS — BP 93/49 | HR 77 | Ht 71.0 in | Wt 147.0 lb

## 2021-01-19 DIAGNOSIS — N186 End stage renal disease: Secondary | ICD-10-CM | POA: Diagnosis not present

## 2021-01-19 DIAGNOSIS — S72144D Nondisplaced intertrochanteric fracture of right femur, subsequent encounter for closed fracture with routine healing: Secondary | ICD-10-CM | POA: Diagnosis not present

## 2021-01-19 DIAGNOSIS — G309 Alzheimer's disease, unspecified: Secondary | ICD-10-CM | POA: Diagnosis not present

## 2021-01-19 DIAGNOSIS — I120 Hypertensive chronic kidney disease with stage 5 chronic kidney disease or end stage renal disease: Secondary | ICD-10-CM | POA: Diagnosis not present

## 2021-01-19 DIAGNOSIS — D631 Anemia in chronic kidney disease: Secondary | ICD-10-CM | POA: Diagnosis not present

## 2021-01-19 DIAGNOSIS — E1122 Type 2 diabetes mellitus with diabetic chronic kidney disease: Secondary | ICD-10-CM | POA: Diagnosis not present

## 2021-01-19 DIAGNOSIS — F0390 Unspecified dementia without behavioral disturbance: Secondary | ICD-10-CM | POA: Diagnosis not present

## 2021-01-19 DIAGNOSIS — F0280 Dementia in other diseases classified elsewhere without behavioral disturbance: Secondary | ICD-10-CM

## 2021-01-19 HISTORY — DX: Dementia in other diseases classified elsewhere, unspecified severity, without behavioral disturbance, psychotic disturbance, mood disturbance, and anxiety: F02.80

## 2021-01-19 NOTE — Progress Notes (Signed)
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 We reviewed results, answered all questions, provided information and local groups/resources.   HPI 07/28/2020: [name] is a 80 y.o. male here as requested by Elby Showers, MD for cognitive changes.  Past medical history bilateral subdural hematoma status post hematoma evacuation, end-stage renal disease on hemodialysis, anemia, secondary hyperparathyroidism, hypertension with intermittent hypotension, dementia, multiple rib fractures, diabetes mellitus and hypothyroidism, diabetes, b-12 deficiency,sleep apnea.   Patient has a history of cognitive problems, he has been on Aricept prescribed from the [place] since 2017. This isnot new and we have been following him since 2017. MMSE has slowly declined. He is currently on Aricept and Memantine. Last CT of the head showed bilateral stable chronic subdural hematomas, remote lacunar infarcts, chronic small-vessel disease consistent with prior imaging and MRI from 03/2015. From history patient in 2017 reported(with his wife) memory loss in conjunction with worsening medical problems, losing things like his cell phone, forgetting to close cabinets, forgetting people's names and appointments, needing help with medication, misplacing items like checks, no delusions or hallucinations. He has has uncontrolled medical problems in the past such as uncontrolled diabetes, he is on dialysis, he has had strokes as well as chronic subdural hematomas. His last hgba1c was 5.3(previously uncontrolled), TSH 03/2020 was normal, last B12 10/2018 was 445.  Wife is concerned about his short-term  memory, he will forget what day and year it is, worse after the fall in 2019 after surgery but ongoing for many years prior to 2017. He can remember the things from the past, mom dies early at 21, father lived until the age of 39 and started having some memory problems. He has lost weight and doesn't snore. We discussed dementia, options for evaluation, treatments. Answered all questions. Marland Kitchen   HPI 07/30/2019:  [name] is a 80 y.o. male here as requested by Pearson Grippe, MD  for subdural hematoma.  Past medical history bilateral subdural hematoma status post hematoma evacuation, end-stage renal disease on hemodialysis, anemia, secondary hyperparathyroidism, hypertension with intermittent hypotension, dementia, multiple rib fractures, diabetes mellitus and hypothyroidism.  Patient is status post subdural evacuation July 22, 2018.  Here with his wife, he has headaches in the morning, improving, across the forehead, after dialysis.wakes with headaches every morning and then it gets better. He sleeps in a hospital bed and raises the head of the bed. He is very active overnight, he fights with his sheets. Tylenol helps. Cognitive decline.  Reviewed notes, labs and imaging from outside physicians, which showed:  I reviewed the referring providers notes from [facility], patient had a subdural hematoma on 07/29/2018, on hemodialysis Monday Wednesday Friday, no heparin is currently being used, patient keeps clotting even with other interventions, can patient have a light dose of heparin during treatment.  Patient had bilateral subdural hematoma evacuation.  07/30/2017: Patient is stable, he sleeps ok, he was having a lot of stress in the past when he saw [place] with medical issues. He is going through dialysis, heels his memory issues are stable. Discussed his memory, MMSE 28/30,  exam is stable today. If memory loss is progressive refer him to neurocognitive testing at  this point we'll monitor  clinically.  UPDATE 10/18/2017CM [name], 80 year old male returns for follow-up. He has history of mild cognitive. MOCA is stable. He is currently on Aricept denies side effects to the medication. He gets his medications through the [place]. He goes to dialysis 3 times a week. He continues to be fairly active. He is planning a vacation to [place] next month. He feels his memory is stable He continues to drive without difficulty. He says he has not gotten lost. He returns for reevaluation    HISTORY OF PRESENT ILLNESS:UPDATE 5/ 3/ 2018CM [name], 80 year old male returns for follow-up with history of mild cognitive impairment. He is currently on Aricept 10 mg daily through the [place] without side effects. He continues to go to dialysis 3 times a week. He continues to be active. He is going to [place] 2 weeks to see his granddaughter get married. He has had no issues with driving. His  MOCA score is stable. He returns for reevaluation. Memory continues to decline.   UPDATE 10/18/2017CM [name], 80 year old male returns for follow-up. He has history of mild cognitive. MOCA is stable. He is currently on Aricept denies side effects to the medication. He gets his medications through the [place]. He goes to dialysis 3 times a week. He continues to be fairly active. He is planning a vacation to [place] next month. He feels his memory is stable He continues to drive without difficulty. He says he has not gotten lost. He returns for reevaluation   Interval history: 01/12/16 AAPatient no-showed for multiple (3) appointments and was discharged from our practice. I spoke to him and agreed to see him again wit the understanding that if he misses another appointment he will need to find another neurology group. He has been going through a lot recently, trying to get disability and his health has declined. He says he has too many appointments. He goes to Dr. Renold Genta and also goes to the [place]. He can follow with Dr. Renold Genta,  doesn't need to follow with [place] unless he needs [place] if he feels he has too many doctors. He takes 627m of asa a day, not sure why, advised him this is not indicated for stroke prevention. 847mdaily. 65048maily may increase his risk of bleeding. He is on Aricept 32m51mily and we will refill this today. Last hgba1c 7.2. LDL 33. TSH wnl. B12 in July 2016 was 1068 (Hx of b12 deficiency). Memory is stable, in fact he is feeling a little better.   HPI: [name] 73 y31. male here as a referral from Dr. BaxlRenold Genta memory problems. PMHx peripheral vascular disease, hypertension, hypothyroidism, hyperparathyroidism, diabetes(uncontrolled since starting dialysis), end-stage renal disease on dialysis, hyperlipidemia, b-12 deficiency on injections (B12 237 at that time). Recently he has noticed he can't catch onto things as quickly as he used to. The latter part of may her noticed it, when he was having more medical issues and he was gaining fluid and had a bout of pneumonia. His memory got really bad before he went into the hospital and was started on dialysis. Things are better since then. He is more alert and catching things better. Wife is here and provides much information, she says he can't keep up with his cell phone and keys and he loses them. He goes into the kitchen to make a snack and he forgets to close the cabinets. He is forgetting people's names, appointments. He used to always be  able to monitor medications and he doesn't remember what he is taking his meds for now. He used to be very sharp with this meds but there is a big change. He misplaces checks. He does pay the bills. No accidents with the car or at home. No delusions or hallucinations. No changes in personality. He is more anxious because of the medical problems. Wife endorses depression. He still gets out and likes to do things, but the side effects of his medical conditions are hard for him to handle. He is frustrated. No loss of  consciousness, no episodes of confusion, no staring spells. No other focal neurologic deficits. He worries a lot. Endorses snoring, excessive daytime fatigue and witnessed apneic events.       Review of Systems: Patient complains of symptoms per HPI as well as the following symptoms: fatigue, memory loss, fall . Pertinent negatives and positives per HPI. All others negative    Social History   Socioeconomic History  . Marital status: Married    Spouse name: [name] Mood  . Number of children: 1  . Years of education: 43  . Highest education level: Not on file  Occupational History  . Occupation: Retired  Tobacco Use  . Smoking status: Former Smoker    Quit date: 09/24/1994    Years since quitting: 26.3  . Smokeless tobacco: Never Used  Vaping Use  . Vaping Use: Never used  Substance and Sexual Activity  . Alcohol use: No    Alcohol/week: 0.0 standard drinks    Comment: Quit Oct. 1977 ("somewhat heavy")  . Drug use: No  . Sexual activity: Not on file  Other Topics Concern  . Not on file  Social History Narrative   Lives at home with wife.   Caffeine use: Drinks no tea, "a little soda"   Drinks 1 cup coffee/week   Right handed   VETERAN   Social Determinants of Health   Financial Resource Strain: Not on file  Food Insecurity: No Food Insecurity  . Worried About Charity fundraiser in the Last Year: Never true  . Ran Out of Food in the Last Year: Never true  Transportation Needs: No Transportation Needs  . Lack of Transportation (Medical): No  . Lack of Transportation (Non-Medical): No  Physical Activity: Not on file  Stress: Not on file  Social Connections: Not on file  Intimate Partner Violence: Not on file    Family History  Problem Relation Age of Onset  . Aneurysm Mother        brain  . Heart disease Father   . Stroke Father   . Hypertension Father   . Diabetes Father   . Dementia Neg Hx   . Colon cancer Neg Hx   . Esophageal cancer Neg Hx   .  Pancreatic cancer Neg Hx   . Prostate cancer Neg Hx   . Rectal cancer Neg Hx   . Stomach cancer Neg Hx     Past Medical History:  Diagnosis Date  . Allergy   . Anemia   . Arthritis   . Cataract    bil cateracts removed  . Coronary artery disease   . Dementia arising in the senium and presenium ([place])   . Diabetes mellitus    Type 2  . Diverticulitis   . ED (erectile dysfunction)   . Elevated homocysteine   . ESRD (end stage renal disease) on dialysis ([place]) 03/2015  . GERD (gastroesophageal reflux disease)    pepto   .  Gout   . Headache, unspecified 02/25/2015  . Hiatal hernia   . Hyperlipidemia   . Hypertension   . Hypothyroidism   . Pneumonia   . PVD (peripheral vascular disease) ([place])    has plastic aorta  . Renal insufficiency   . Seasonal allergies   . Sleep apnea    does not wear c-pap    Past Surgical History:  Procedure Laterality Date  . aortobifemoral bypass    . AV FISTULA PLACEMENT Left 12/01/2013   Procedure: ARTERIOVENOUS (AV) FISTULA CREATION- LEFT BRACHIOCEPHALIC;  Surgeon: Angelia Mould, MD;  Location: [place];  Service: Vascular;  Laterality: Left;  . [place] TRANSPOSITION Right 07/27/2014   Procedure: BASCILIC VEIN TRANSPOSITION;  Surgeon: Angelia Mould, MD;  Location: [place];  Service: Vascular;  Laterality: Right;  . BRAIN SURGERY  07/22/2018  . BREAST SURGERY     left - granulomatous mastitis  . BURR HOLE Bilateral 07/22/2018   Procedure: BILATERAL BURR HOLES;  Surgeon: Kristeen Miss, MD;  Location: [facility];  Service: Neurosurgery;  Laterality: Bilateral;  . COLONOSCOPY    . 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 ENTEROSCOPY N/A 06/03/2018   Procedure: ENTEROSCOPY;  Surgeon: Lavena Bullion, DO;  Location: [facility];  Service: Gastroenterology;  Laterality: N/A;  . ENTEROSCOPY N/A 11/20/2018   Procedure: ENTEROSCOPY;  Surgeon: Rush Landmark Telford Nab., MD;  Location: [facility] ENDOSCOPY;  Service: Gastroenterology;   Laterality: N/A;  . ENTEROSCOPY N/A 12/03/2019   Procedure: ENTEROSCOPY;  Surgeon: Lavena Bullion, DO;  Location: [facility] ENDOSCOPY;  Service: Gastroenterology;  Laterality: N/A;  push enteroscopy  . EYE SURGERY Bilateral    cataracts  . HEMODIALYSIS INPATIENT  01/17/2018      . HOT HEMOSTASIS N/A 06/03/2018   Procedure: HOT HEMOSTASIS (ARGON PLASMA COAGULATION/BICAP);  Surgeon: Lavena Bullion, DO;  Location: [facility] ENDOSCOPY;  Service: Gastroenterology;  Laterality: N/A;  . HOT HEMOSTASIS N/A 11/20/2018   Procedure: HOT HEMOSTASIS (ARGON PLASMA COAGULATION/BICAP);  Surgeon: Irving Copas., MD;  Location: [place];  Service: Gastroenterology;  Laterality: N/A;  . HOT HEMOSTASIS N/A 12/03/2019   Procedure: HOT HEMOSTASIS (ARGON PLASMA COAGULATION/BICAP);  Surgeon: Lavena Bullion, DO;  Location: [facility] ENDOSCOPY;  Service: Gastroenterology;  Laterality: N/A;  . INTRAMEDULLARY (IM) NAIL INTERTROCHANTERIC Right 11/08/2020   Procedure: INTRAMEDULLARY (IM) NAIL INTERTROCHANTRIC;  Surgeon: Erle Crocker, MD;  Location: [facility];  Service: Orthopedics;  Laterality: Right;  . REVISION OF ARTERIOVENOUS GORETEX GRAFT Right 09/01/2019   Procedure: REVISION OF ARTERIOVENOUS FISTULA RIGHT ARM;  Surgeon: Serafina Mitchell, MD;  Location: [facility];  Service: Vascular;  Laterality: Right;  . REVISON OF ARTERIOVENOUS FISTULA Left 02/09/2014   Procedure: REVISON OF LEFT ARTERIOVENOUS FISTULA - RESECTION OF RENDUNDANT VEIN;  Surgeon: Angelia Mould, MD;  Location: [place];  Service: Vascular;  Laterality: Left;  . SBO with lysis adhesions    . SHUNTOGRAM Left 04/19/2014   Procedure: FISTULOGRAM;  Surgeon: Angelia Mould, MD;  Location: [facility] CATH LAB;  Service: Cardiovascular;  Laterality: Left;  . UNILATERAL UPPER EXTREMEITY ANGIOGRAM N/A 07/12/2014   Procedure: UNILATERAL UPPER Anselmo Rod;  Surgeon: Angelia Mould, MD;  Location: [facility] CATH LAB;  Service: Cardiovascular;  Laterality:  N/A;    Current Outpatient Medications  Medication Sig Dispense Refill  . acetaminophen (TYLENOL) 325 MG tablet Take 1-2 tablets (325-650 mg total) by mouth every 4 (four) hours as needed for mild pain.    Marland Kitchen albuterol (VENTOLIN  HFA) 108 (90 Base) MCG/ACT inhaler Inhale 1 puff into the lungs daily as needed for wheezing or shortness of breath.    . allopurinol (ZYLOPRIM) 100 MG tablet Take 100 mg by mouth daily.    . Blood Glucose Monitoring Suppl (FREESTYLE FREEDOM LITE) w/Device KIT Use to check blood sugar 2 times per day dx code E11.65 1 each 0  . camphor-menthol (SARNA) lotion Apply 1 application topically 4 (four) times daily as needed for itching.     . Carboxymethylcellulose Sodium (THERATEARS) 0.25 % SOLN Place 1 drop into both eyes as needed (for dryness).    . cetirizine (ZYRTEC) 10 MG chewable tablet Chew 10 mg by mouth every Monday, Wednesday, and Friday.    . clobetasol (TEMOVATE) 0.05 % external solution Apply 1 application topically 2 (two) times daily as needed (scalp irritation.).   0  . denosumab (PROLIA) 60 MG/ML SOSY injection Inject 60 mg into the skin every 6 (six) months.    . donepezil (ARICEPT) 10 MG tablet Take 1 tablet (10 mg total) by mouth at bedtime. 90 tablet 3  . FLUOCINOLONE ACETONIDE SCALP 0.01 % OIL Apply 1 application topically daily as needed (scalp irritation.).   0  . fluticasone (FLONASE) 50 MCG/ACT nasal spray Place 1 spray into both nostrils daily. 16 g 2  . glucose blood (FREESTYLE LITE) test strip USE AS DIRECTED THREE TIMES DAILY 300 strip 2  . hydrocortisone 2.5 % cream Apply 1 application topically 3 (three) times daily as needed (skin irritation (legs & arms)).     Marland Kitchen insulin aspart (NOVOLOG FLEXPEN) 100 UNIT/ML FlexPen INJECT 6-8 UNITS UNDER THE SKIN THREE TIMES DAILY BEFORE MEALS. DX:E11.65 (Patient taking differently: Inject 6-8 Units into the skin 3 (three) times daily with meals. INJECT 6-8 UNITS UNDER THE SKIN THREE TIMES DAILY BEFORE MEALS.  DX:E11.65) 15 mL 12  . Insulin Pen Needle (PEN NEEDLES) 32G X 4 MM MISC 1 each by Does not apply route 3 (three) times daily. USE PEN NEEDLES TO INJECT INSULIN THREE TIMES DAILY. 100 each 12  . ketoconazole (NIZORAL) 2 % shampoo Apply 1 application topically 2 (two) times a week. 120 mL 0  . Lancets (FREESTYLE) lancets Use as instructed to check blood sugar 2 times per day dx code E11.65 100 each 3  . levothyroxine (SYNTHROID, LEVOTHROID) 50 MCG tablet Take 1 tablet (50 mcg total) by mouth daily. (Patient taking differently: Take 50 mcg by mouth daily before breakfast.) 90 tablet 1  . lidocaine (LIDODERM) 5 % Place 1 patch onto the skin daily as needed (back pain).     . memantine (NAMENDA) 10 MG tablet Take 1 tablet (10 mg total) by mouth 2 (two) times daily. 180 tablet 3  . midodrine (PROAMATINE) 10 MG tablet Take one pill prior to hemodialysis on MWF (Patient taking differently: Take 5 mg by mouth every Monday, Wednesday, and Friday. Take one pill prior to hemodialysis on MWF)    . Nutritional Supplements (FEEDING SUPPLEMENT, NEPRO CARB STEADY,) LIQD Take 237 mLs by mouth in the morning and at bedtime.     Marland Kitchen omeprazole (PRILOSEC) 40 MG capsule Take 1 capsule (40 mg total) by mouth daily. 30 capsule 0  . sevelamer carbonate (RENVELA) 800 MG tablet Take 1,600-2,400 mg by mouth See admin instructions. Take 2,400 mg by mouth three times a day with meals and 1,600 mg with snacks on Sun/Tues/Thurs/Sat and 2,400 mg with breakfast and dinner and nothing with snacks on Mon/Wed/Fri    . simvastatin (  ZOCOR) 20 MG tablet Take 20 mg by mouth at bedtime.    . TRADJENTA 5 MG TABS tablet TAKE 1 TABLET DAILY (Patient taking differently: Take 5 mg by mouth daily.) 90 tablet 3   No current facility-administered medications for this visit.    Allergies as of 01/19/2021 - Review Complete 01/19/2021  Allergen Reaction Noted  . Ambien [zolpidem tartrate] Other (See Comments) 11/21/2018  . Penicillins Rash and Hives  12/27/2000    Vitals: BP (!) 93/49 (BP Location: Left Arm, Patient Position: Sitting)   Pulse 77   Ht 5' 11"  (1.803 m)   Wt 147 lb (66.7 kg)   BMI 20.50 kg/m  Last Weight:  Wt Readings from Last 1 Encounters:  01/19/21 147 lb (66.7 kg)   Last Height:   Ht Readings from Last 1 Encounters:  01/19/21 5' 11"  (1.803 m)    MMSE - Mini Mental State Exam 07/28/2020 07/30/2019 05/06/2018  Orientation to time 4 2 4   Orientation to Place 4 5 4   Registration 3 3 3   Attention/ Calculation 0 3 4  Recall 0 2 1  Language- name 2 objects 2 2 2   Language- repeat 0 0 1  Language- follow 3 step command 3 3 2   Language- read & follow direction 1 1 1   Write a sentence 0 1 1  Copy design 0 0 1  Total score 17 22 24       Montreal Cognitive Assessment  01/24/2017 07/11/2016 05/20/2015  Visuospatial/ Executive (0/5) 5 4 5   Naming (0/3) 2 3 2   Attention: Read list of digits (0/2) 2 1 1   Attention: Read list of letters (0/1) 1 1 1   Attention: Serial 7 subtraction starting at 100 (0/3) 1 3 1   Language: Repeat phrase (0/2) 1 2 2   Language : Fluency (0/1) 1 0 0  Abstraction (0/2) 2 1 1   Delayed Recall (0/5) 3 3 3   Orientation (0/6) 5 6 6   Total 23 24 22   Adjusted Score (based on education) - - 22   Exam: NAD, pleasant                  Speech:    Speech is normal; fluent and spontaneous Cognition:    See MMSE    Cranial Nerves:    The pupils are equal, round, and reactive to light.Trigeminal sensation is intact. The face is symmetric.  Hearing intact. Voice is normal. Shoulder shrug is normal. The tongue has normal motion .   Coordination:  No dysmetria or ataxia noted  Gait: Wide based and shuffling  Motor Observation:    No asymmetry, no atrophy, and no involuntary movements noted.     Strength:    Strength is symmetrical in the upper and lower limbs. No focal deficits appreciated     Sensation: intact to LT     Assessment/Plan:  80 y.o. male here as requested by Elby Showers, MD for cognitive changes.  Past medical history bilateral subdural hematoma status post hematoma evacuation, end-stage renal disease on hemodialysis, anemia, secondary hyperparathyroidism, hypertension with intermittent hypotension, dementia, multiple rib fractures, diabetes mellitus and hypothyroidism, diabetes, b-12 deficiency,sleep apnea.   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 We reviewed results, answered all questions, provided information and local groups/resources  Prefers to follow with Dr. Renold Genta and see [place] as needed.   Patient has a history of cognitive problems,  he has been on Aricept prescribed from the [place] since 2017.  MMSE has slowly declined now 17/30. He is currently on Aricept and Namenda.   No orders of the defined types were placed in this encounter.  No orders of the defined types were placed in this encounter.   Sarina Ill, MD  [facility] [address] [place], [place] [address]  Phone [phone] Fax [phone]  Cc: Emeline General MD, Pearson Grippe, MD  I spent over 20 minutes of face-to-face and non-face-to-face time with patient on the  1. Alzheimer's disease, unspecified (CODE) ([place])    diagnosis.  This included previsit chart review, lab review, study review, order entry, electronic health record documentation, patient education on the different diagnostic and therapeutic options, counseling and coordination of care, risks and benefits of management, compliance, or risk factor reduction

## 2021-01-19 NOTE — Telephone Encounter (Signed)
I have spoken with Mrs. [name] about Palliative Care consult. She is willing to have a meeting to see what services they can offer in the care of her husband with complex medical issues. MJB, MD

## 2021-01-19 NOTE — Patient Outreach (Signed)
[facility]) Care Management  [place]  01/19/2021   [name] [birth year] [id number]  Subjective: Telephone outreach to follow up on ED visit: SOB, hyperglycemia, new dx: AFIB  Encounter Medications:  Outpatient Encounter Medications as of 01/19/2021  Medication Sig  . acetaminophen (TYLENOL) 325 MG tablet Take 1-2 tablets (325-650 mg total) by mouth every 4 (four) hours as needed for mild pain.  Marland Kitchen albuterol (VENTOLIN HFA) 108 (90 Base) MCG/ACT inhaler Inhale 1 puff into the lungs daily as needed for wheezing or shortness of breath.  . allopurinol (ZYLOPRIM) 100 MG tablet Take 100 mg by mouth daily.  . Blood Glucose Monitoring Suppl (FREESTYLE FREEDOM LITE) w/Device KIT Use to check blood sugar 2 times per day dx code E11.65  . camphor-menthol (SARNA) lotion Apply 1 application topically 4 (four) times daily as needed for itching.   . Carboxymethylcellulose Sodium (THERATEARS) 0.25 % SOLN Place 1 drop into both eyes as needed (for dryness).  . cetirizine (ZYRTEC) 10 MG chewable tablet Chew 10 mg by mouth every Monday, Wednesday, and Friday.  . clobetasol (TEMOVATE) 0.05 % external solution Apply 1 application topically 2 (two) times daily as needed (scalp irritation.).   Marland Kitchen denosumab (PROLIA) 60 MG/ML SOSY injection Inject 60 mg into the skin every 6 (six) months.  . donepezil (ARICEPT) 10 MG tablet Take 1 tablet (10 mg total) by mouth at bedtime.  Marland Kitchen FLUOCINOLONE ACETONIDE SCALP 0.01 % OIL Apply 1 application topically daily as needed (scalp irritation.).   Marland Kitchen fluticasone (FLONASE) 50 MCG/ACT nasal spray Place 1 spray into both nostrils daily.  Marland Kitchen glucose blood (FREESTYLE LITE) test strip USE AS DIRECTED THREE TIMES DAILY  . hydrocortisone 2.5 % cream Apply 1 application topically 3 (three) times daily as needed (skin irritation (legs & arms)).   Marland Kitchen insulin aspart (NOVOLOG FLEXPEN) 100 UNIT/ML FlexPen INJECT 6-8 UNITS UNDER THE SKIN THREE TIMES DAILY BEFORE MEALS.  DX:E11.65  . Insulin Pen Needle (PEN NEEDLES) 32G X 4 MM MISC 1 each by Does not apply route 3 (three) times daily. USE PEN NEEDLES TO INJECT INSULIN THREE TIMES DAILY.  Marland Kitchen ketoconazole (NIZORAL) 2 % shampoo Apply 1 application topically 2 (two) times a week.  . Lancets (FREESTYLE) lancets Use as instructed to check blood sugar 2 times per day dx code E11.65  . levothyroxine (SYNTHROID, LEVOTHROID) 50 MCG tablet Take 1 tablet (50 mcg total) by mouth daily.  Marland Kitchen lidocaine (LIDODERM) 5 % Place 1 patch onto the skin daily as needed (back pain).   . memantine (NAMENDA) 10 MG tablet Take 1 tablet (10 mg total) by mouth 2 (two) times daily.  . midodrine (PROAMATINE) 10 MG tablet Take one pill prior to hemodialysis on MWF  . Nutritional Supplements (FEEDING SUPPLEMENT, NEPRO CARB STEADY,) LIQD Take 237 mLs by mouth in the morning and at bedtime.   Marland Kitchen omeprazole (PRILOSEC) 40 MG capsule Take 1 capsule (40 mg total) by mouth daily.  . sevelamer carbonate (RENVELA) 800 MG tablet Take 1,600-2,400 mg by mouth See admin instructions. Take 2,400 mg by mouth three times a day with meals and 1,600 mg with snacks on Sun/Tues/Thurs/Sat and 2,400 mg with breakfast and dinner and nothing with snacks on Mon/Wed/Fri  . simvastatin (ZOCOR) 20 MG tablet Take 20 mg by mouth at bedtime.  . TRADJENTA 5 MG TABS tablet TAKE 1 TABLET DAILY   No facility-administered encounter medications on file as of 01/19/2021.   Functional Status:  In your present state of  health, do you have any difficulty performing the following activities: 01/05/2021 01/03/2021  Hearing? - N  Vision? N N  Difficulty concentrating or making decisions? [name]  Walking or climbing stairs? Y Y  Dressing or bathing? Y Y  Doing errands, shopping? [name]  Preparing Food and eating ? Y -  Using the Toilet? Y -  In the past six months, have you accidently leaked urine? Y -  Do you have problems with loss of bowel control? Y -  Managing your Medications? Y -   Managing your Finances? Y -  Housekeeping or managing your Housekeeping? Y -  Some recent data might be hidden    Fall/Depression Screening: Fall Risk  01/05/2021 01/03/2021 01/20/2019  Falls in the past year? _0 Comment - - -  Number falls in past yr: 1 0 0  Injury with Fall? 1 1 0  Risk for fall due to : History of fall(s);Impaired balance/gait;Impaired mobility;Medication side effect;Mental status change;Orthopedic patient - -  Follow up Falls evaluation completed;Education provided;Falls prevention discussed Falls evaluation completed Falls evaluation completed;Education provided   [facility] 2/9 Scores 01/05/2021 01/20/2019 10/09/2018 08/26/2018 08/29/2017 02/14/2015 02/14/2015  PHQ - 2 Score 6 0 2 2 0 0 0  PHQ- 9 Score 18 - - 14 - - -   Assessment: Dwindling                        Wt loss 11# in 2 weeks                        DM with some hypoglycemia                        AFIB                        APPROPRIATE CANDIDATE FOR PALLIATIVE CARE CONSULT  Goals Addressed            This Visit's Progress   . Cope with Pain as evidenced by improved ability to move around by wife's report over the next 3 months.   On track    Timeframe:  Long-Range Goal Priority:  High Start Date:      01/05/21                       Expected End Date:     04/21/21                  Follow Up Date 02/20/21   Notes: 01/05/21 Wife reports pt back pain keeps him from doing everything in his PT sessions. He takes 1000 mg APAP am and PM. Recommended increasing to tid max dose. Also use topical diclofenac on a regular basis. Exercise on days that he does not have PT. 01/19/21 [name] had an ER visit for SOB, hypglycemia, found new dx of AFIB. No PE. Started Eliquis. Applied a heart monitor to be worn for 2 weeks. Pt is still getting PT and his mobiilty is slowly improving. He takes APAP am and pm and this helps him get up and get moving.    . Improve My Nutrition with the Foods I Eat and Drink as evidenced by minimal  wt loss over the next 30 days.   Not on track    Timeframe:  Short range Priority:  Medium Start Date:   01/05/21  Expected End Date:    02/05/21           Follow Up Date 02/05/21   - choose small, frequent meals - drink protein shakes (sipping is ok) - fill half of plate with vegetables    Why is this important?    Eating 3 healthy meals, or 5 or 6 small meals, a day is very important.   A healthy diet is one that has fruit, vegetables, protein, good fats, whole grains and low-fat dairy products.   Be sure to have plenty of protein that builds strong muscles. Many older people do not get quite enough protein.     Notes: 01/05/21 Pt is ESRD on dialysis, protein must be limited but needs to have max allowed 60 Gms per day. Has had dietary counseling. Current wt per wife is 158. 01/19/21 Pt wt today is 147 an 11# wt loss in 2 weeks. WIfe reports poor appetite, poor intake. Discussed appropriate snack for hypoglycemia. Suggested 1/2 PBJ sandwich with glass of milk. Encourage food often, small amounts.    . Monitor and Manage My Blood Sugar-Diabetes Type 2 by wife's report of FBS range over the next 3 months.   On track    Timeframe:  Long-Range Goal Priority:  Medium Start Date:    01/05/21                         Expected End Date:   04/21/29                 Follow Up Date 02/05/21   - check blood sugar at prescribed times - take the blood sugar meter to all doctor visits    Why is this important?    Checking your blood sugar at home helps to keep it from getting very high or very low.   Writing the results in a diary or log helps the doctor know how to care for you.   Your blood sugar log should have the time, date and the results.   Also, write down the amount of insulin or other medicine that you take.   Other information, like what you ate, exercise done and how you were feeling, will also be helpful.     Notes: 01/05/21 Wife reports FBS today was 89.  Usually between 70-90. Last HgbA1C was 5.3. He is on trajenta and lantus. Wife to make endocrinology appt to discuss discontinuation of one of these. 01/19/21 Pt has ED visit with hypoglycemia, SOB, new AFIB. Glucose was 51 first thing in the am. He is getting units regular insulin with regular glucose readings before bed. Today's FBS was 72, before supper reading was 124. Requested that wife try no hs insulin at hs and see how FBS are. Report this experiment to his endocrinologist scheduled for next week. Pt condition is definitely dwindling. Have introduced the idea of Palliative Care    . Obtain Eye Exam-Diabetes Type 2 within the next 3 months per report.       Follow Up Date 02/05/21   - schedule appointment with eye doctor    Why is this important?    Eye check-ups are important when you have diabetes.   Vision loss can be prevented.    Notes: 01/05/21 Had an appt scheduled but then had fall and fx. Wife to reschedule. 4/28./22 Too much going on to think about eye exam. Will play it by ear. Pt condition in dwindling.  Plan: We agreed to talk again in 2 weeks. Will send my report to primary care and endocrinologist Follow-up:  Patient agrees to Care Plan and Follow-up.   Eulah Pont. Myrtie Neither, MSN, [facility] Gerontological Nurse Practitioner [facility] Care Management [phone]

## 2021-01-20 ENCOUNTER — Encounter (HOSPITAL_COMMUNITY): Payer: Self-pay

## 2021-01-20 ENCOUNTER — Other Ambulatory Visit: Payer: Self-pay

## 2021-01-20 ENCOUNTER — Ambulatory Visit: Payer: Medicare Other | Admitting: Internal Medicine

## 2021-01-20 ENCOUNTER — Emergency Department (HOSPITAL_COMMUNITY)
Admission: EM | Admit: 2021-01-20 | Discharge: 2021-01-20 | Disposition: A | Discharge status: Home / Self Care | Payer: Medicare Other | Attending: Emergency Medicine | Admitting: Emergency Medicine

## 2021-01-20 DIAGNOSIS — E1122 Type 2 diabetes mellitus with diabetic chronic kidney disease: Secondary | ICD-10-CM | POA: Insufficient documentation

## 2021-01-20 DIAGNOSIS — Z992 Dependence on renal dialysis: Secondary | ICD-10-CM | POA: Insufficient documentation

## 2021-01-20 DIAGNOSIS — N186 End stage renal disease: Secondary | ICD-10-CM | POA: Insufficient documentation

## 2021-01-20 DIAGNOSIS — I10 Essential (primary) hypertension: Secondary | ICD-10-CM | POA: Diagnosis not present

## 2021-01-20 DIAGNOSIS — E039 Hypothyroidism, unspecified: Secondary | ICD-10-CM | POA: Diagnosis not present

## 2021-01-20 DIAGNOSIS — L98491 Non-pressure chronic ulcer of skin of other sites limited to breakdown of skin: Secondary | ICD-10-CM | POA: Insufficient documentation

## 2021-01-20 DIAGNOSIS — N2581 Secondary hyperparathyroidism of renal origin: Secondary | ICD-10-CM | POA: Diagnosis not present

## 2021-01-20 DIAGNOSIS — S72141A Displaced intertrochanteric fracture of right femur, initial encounter for closed fracture: Secondary | ICD-10-CM | POA: Diagnosis not present

## 2021-01-20 DIAGNOSIS — I2510 Atherosclerotic heart disease of native coronary artery without angina pectoris: Secondary | ICD-10-CM | POA: Insufficient documentation

## 2021-01-20 DIAGNOSIS — E785 Hyperlipidemia, unspecified: Secondary | ICD-10-CM | POA: Diagnosis not present

## 2021-01-20 DIAGNOSIS — I120 Hypertensive chronic kidney disease with stage 5 chronic kidney disease or end stage renal disease: Secondary | ICD-10-CM | POA: Diagnosis not present

## 2021-01-20 DIAGNOSIS — D509 Iron deficiency anemia, unspecified: Secondary | ICD-10-CM | POA: Diagnosis not present

## 2021-01-20 DIAGNOSIS — M2518 Fistula, other specified site: Secondary | ICD-10-CM | POA: Diagnosis present

## 2021-01-20 DIAGNOSIS — E876 Hypokalemia: Secondary | ICD-10-CM | POA: Diagnosis not present

## 2021-01-20 DIAGNOSIS — T82898A Other specified complication of vascular prosthetic devices, implants and grafts, initial encounter: Secondary | ICD-10-CM

## 2021-01-20 DIAGNOSIS — Z794 Long term (current) use of insulin: Secondary | ICD-10-CM | POA: Insufficient documentation

## 2021-01-20 DIAGNOSIS — Z9889 Other specified postprocedural states: Secondary | ICD-10-CM | POA: Diagnosis not present

## 2021-01-20 LAB — CBC WITH DIFFERENTIAL/PLATELET
Abs Immature Granulocytes: 0.02 10*3/uL (ref 0.00–0.07)
Basophils Absolute: 0.1 10*3/uL (ref 0.0–0.1)
Basophils Relative: 1 %
Eosinophils Absolute: 0.2 10*3/uL (ref 0.0–0.5)
Eosinophils Relative: 3 %
HCT: 31.6 % — ABNORMAL LOW (ref 39.0–52.0)
Hemoglobin: 10.2 g/dL — ABNORMAL LOW (ref 13.0–17.0)
Immature Granulocytes: 0 %
Lymphocytes Relative: 25 %
Lymphs Abs: 1.5 10*3/uL (ref 0.7–4.0)
MCH: 31.6 pg (ref 26.0–34.0)
MCHC: 32.3 g/dL (ref 30.0–36.0)
MCV: 97.8 fL (ref 80.0–100.0)
Monocytes Absolute: 0.6 10*3/uL (ref 0.1–1.0)
Monocytes Relative: 11 %
Neutro Abs: 3.5 10*3/uL (ref 1.7–7.7)
Neutrophils Relative %: 60 %
Platelets: 202 10*3/uL (ref 150–400)
RBC: 3.23 MIL/uL — ABNORMAL LOW (ref 4.22–5.81)
RDW: 19.1 % — ABNORMAL HIGH (ref 11.5–15.5)
WBC: 5.9 10*3/uL (ref 4.0–10.5)
nRBC: 1.4 % — ABNORMAL HIGH (ref 0.0–0.2)

## 2021-01-20 LAB — BASIC METABOLIC PANEL
Anion gap: 9 (ref 5–15)
BUN: 12 mg/dL (ref 8–23)
CO2: 30 mmol/L (ref 22–32)
Calcium: 8.6 mg/dL — ABNORMAL LOW (ref 8.9–10.3)
Chloride: 97 mmol/L — ABNORMAL LOW (ref 98–111)
Creatinine, Ser: 3.45 mg/dL — ABNORMAL HIGH (ref 0.61–1.24)
GFR, Estimated: 17 mL/min — ABNORMAL LOW (ref >60)
Glucose, Bld: 75 mg/dL (ref 70–99)
Potassium: 3.1 mmol/L — ABNORMAL LOW (ref 3.5–5.1)
Sodium: 136 mmol/L (ref 135–145)

## 2021-01-20 MED ORDER — ACETAMINOPHEN 325 MG PO TABS
650.0000 mg | ORAL_TABLET | Freq: Once | ORAL | Status: AC
  Administered 2021-01-20: 650 mg via ORAL
  Filled 2021-01-20: qty 2

## 2021-01-20 NOTE — ED Notes (Signed)
Pt updated on current status.

## 2021-01-20 NOTE — ED Notes (Signed)
Pt reports an open area with some scabbing present to right AV fistula area in right upper extremity. Noticed it 1 week ago.

## 2021-01-20 NOTE — ED Notes (Signed)
IV team ordered for IV placement and blood draw.

## 2021-01-20 NOTE — ED Notes (Signed)
IV team RN at bedside.  

## 2021-01-20 NOTE — H&P (View-Only) (Signed)
 [facility] [place]  Patient name: [name] MRN: [id number] DOB: 05/22/1941 Sex: male   REQUESTING PROVIDER:    ER   REASON FOR CONSULT:    Scab over fistula  HISTORY OF PRESENT ILLNESS:   [name] is a 79 y.o. male, who was sent to the ER today after completing dialysis for a scab on his fistula.  The patient states that this has been present for several weeks.  He denies any episodes of bleeding.  He does not have swelling in the arm.  He has not had any fevers.  He is on dialysis Monday Wednesday Friday.  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  The patient is here with family.  He has a history of coronary artery disease.  He has mild dementia.  He is a diabetic.  PAST MEDICAL HISTORY    Past Medical History:  Diagnosis Date  . Allergy   . Anemia   . Arthritis   . Cataract    bil cateracts removed  . Coronary artery disease   . Dementia arising in the senium and presenium (HCC)   . Diabetes mellitus    Type 2  . Diverticulitis   . ED (erectile dysfunction)   . Elevated homocysteine   . 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 GERD (gastroesophageal reflux disease)    pepto   . Gout   . Headache, unspecified 02/25/2015  . Hiatal hernia   . Hyperlipidemia   . Hypertension   . Hypothyroidism   . Pneumonia   . PVD (peripheral vascular disease) (HCC)    has plastic aorta  . Renal insufficiency   . Seasonal allergies   . Sleep apnea    does not wear c-pap     FAMILY HISTORY   Family History  Problem Relation Age of Onset  . Aneurysm Mother        brain  . Heart disease Father   . Stroke Father   . Hypertension Father   . Diabetes Father   . Dementia Neg Hx   . Colon cancer Neg Hx   . Esophageal cancer Neg Hx   . Pancreatic cancer Neg Hx   . Prostate cancer Neg Hx   . Rectal cancer Neg Hx   . Stomach cancer Neg Hx     SOCIAL  HISTORY:   Social History   Socioeconomic History  . Marital status: Married    Spouse name: Cheryl  . Number of children: 1  . Years of education: 16  . Highest education level: Not on file  Occupational History  . Occupation: Retired  Tobacco Use  . Smoking status: Former Smoker    Quit date: 09/24/1994    Years since quitting: 26.3  . Smokeless tobacco: Never Used  Vaping Use  . Vaping Use: Never used  Substance and Sexual Activity  . Alcohol use: No    Alcohol/week: 0.0 standard drinks    Comment: Quit Oct. 1977 ("somewhat heavy")  . Drug use: No  . Sexual activity: Not on file  Other Topics Concern  . Not on file  Social History Narrative   Lives at home with wife.   Caffeine use: Drinks no tea, "a little soda"   Drinks 1 cup coffee/week   Right handed   [employer]   Social Determinants of Health   Financial Resource Strain: Not on file  Food Insecurity: No Food Insecurity  .   Worried About Running Out of Food in the Last Year: Never true  . Ran Out of Food in the Last Year: Never true  Transportation Needs: No Transportation Needs  . Lack of Transportation (Medical): No  . Lack of Transportation (Non-Medical): No  Physical Activity: Not on file  Stress: Not on file  Social Connections: Not on file  Intimate Partner Violence: Not on file    ALLERGIES:    Allergies  Allergen Reactions  . Ambien [Zolpidem Tartrate] Other (See Comments)    Hallucinations and "felt crazy"   . Penicillins Rash and Hives    Has patient had a PCN reaction causing immediate rash, facial/tongue/throat swelling, SOB or lightheadedness with hypotension: Yes Has patient had a PCN reaction causing severe rash involving mucus membranes or skin necrosis: Yes Has patient had a PCN reaction that required hospitalization: No Has patient had a PCN reaction occurring within the last 10 years: No If all of the above answers are "NO", then may proceed with Cephalosporin use.  Other  reaction(s): HIVES    CURRENT MEDICATIONS:    No current facility-administered medications for this encounter.   Current Outpatient Medications  Medication Sig Dispense Refill  . acetaminophen (TYLENOL) 325 MG tablet Take 1-2 tablets (325-650 mg total) by mouth every 4 (four) hours as needed for mild pain.    . albuterol (VENTOLIN HFA) 108 (90 Base) MCG/ACT inhaler Inhale 1 puff into the lungs daily as needed for wheezing or shortness of breath.    . allopurinol (ZYLOPRIM) 100 MG tablet Take 100 mg by mouth daily.    . Blood Glucose Monitoring Suppl (FREESTYLE FREEDOM LITE) w/Device KIT Use to check blood sugar 2 times per day dx code E11.65 1 each 0  . camphor-menthol (SARNA) lotion Apply 1 application topically 4 (four) times daily as needed for itching.     . Carboxymethylcellulose Sodium (THERATEARS) 0.25 % SOLN Place 1 drop into both eyes as needed (for dryness).    . cetirizine (ZYRTEC) 10 MG chewable tablet Chew 10 mg by mouth every Monday, Wednesday, and Friday.    . clobetasol (TEMOVATE) 0.05 % external solution Apply 1 application topically 2 (two) times daily as needed (scalp irritation.).   0  . denosumab (PROLIA) 60 MG/ML SOSY injection Inject 60 mg into the skin every 6 (six) months.    . donepezil (ARICEPT) 10 MG tablet Take 1 tablet (10 mg total) by mouth at bedtime. 90 tablet 3  . FLUOCINOLONE ACETONIDE SCALP 0.01 % OIL Apply 1 application topically daily as needed (scalp irritation.).   0  . fluticasone (FLONASE) 50 MCG/ACT nasal spray Place 1 spray into both nostrils daily. 16 g 2  . glucose blood (FREESTYLE LITE) test strip USE AS DIRECTED THREE TIMES DAILY 300 strip 2  . hydrocortisone 2.5 % cream Apply 1 application topically 3 (three) times daily as needed (skin irritation (legs & arms)).     . insulin aspart (NOVOLOG FLEXPEN) 100 UNIT/ML FlexPen INJECT 6-8 UNITS UNDER THE SKIN THREE TIMES DAILY BEFORE MEALS. DX:E11.65 15 mL 12  . Insulin Pen Needle (PEN NEEDLES) 32G X  4 MM MISC 1 each by Does not apply route 3 (three) times daily. USE PEN NEEDLES TO INJECT INSULIN THREE TIMES DAILY. 100 each 12  . ketoconazole (NIZORAL) 2 % shampoo Apply 1 application topically 2 (two) times a week. 120 mL 0  . Lancets (FREESTYLE) lancets Use as instructed to check blood sugar 2 times per day dx code E11.65 100 each   3  . levothyroxine (SYNTHROID, LEVOTHROID) 50 MCG tablet Take 1 tablet (50 mcg total) by mouth daily. 90 tablet 1  . lidocaine (LIDODERM) 5 % Place 1 patch onto the skin daily as needed (back pain).     . memantine (NAMENDA) 10 MG tablet Take 1 tablet (10 mg total) by mouth 2 (two) times daily. 180 tablet 3  . midodrine (PROAMATINE) 10 MG tablet Take one pill prior to hemodialysis on MWF    . Nutritional Supplements (FEEDING SUPPLEMENT, NEPRO CARB STEADY,) LIQD Take 237 mLs by mouth in the morning and at bedtime.     . omeprazole (PRILOSEC) 40 MG capsule Take 1 capsule (40 mg total) by mouth daily. 30 capsule 0  . sevelamer carbonate (RENVELA) 800 MG tablet Take 1,600-2,400 mg by mouth See admin instructions. Take 2,400 mg by mouth three times a day with meals and 1,600 mg with snacks on Sun/Tues/Thurs/Sat and 2,400 mg with breakfast and dinner and nothing with snacks on Mon/Wed/Fri    . simvastatin (ZOCOR) 20 MG tablet Take 20 mg by mouth at bedtime.    . TRADJENTA 5 MG TABS tablet TAKE 1 TABLET DAILY 90 tablet 3    REVIEW OF SYSTEMS:   [X] denotes positive finding, [ ] denotes negative finding Cardiac  Comments:  Chest pain or chest pressure:    Shortness of breath upon exertion:    Short of breath when lying flat:    Irregular heart rhythm:        Vascular    Pain in calf, thigh, or hip brought on by ambulation:    Pain in feet at night that wakes you up from your sleep:     Blood clot in your veins:    Leg swelling:         Pulmonary    Oxygen at home:    Productive cough:     Wheezing:         Neurologic    Sudden weakness in arms or legs:      Sudden numbness in arms or legs:     Sudden onset of difficulty speaking or slurred speech:    Temporary loss of vision in one eye:     Problems with dizziness:         Gastrointestinal    Blood in stool:      Vomited blood:         Genitourinary    Burning when urinating:     Blood in urine:        Psychiatric    Major depression:         Hematologic    Bleeding problems:    Problems with blood clotting too easily:        Skin    Rashes or ulcers:        Constitutional    Fever or chills:     PHYSICAL EXAM:   Vitals:   01/20/21 1923 01/20/21 1930 01/20/21 2030 01/20/21 2231  BP: (!) 134/49 (!) 146/50 (!) 110/45 (!) 121/41  Pulse: 70 72 70 66  Resp: 17 18 18 20  Temp:    97.6 F (36.4 C)  TempSrc:    Oral  SpO2: 100% 100% 100% 99%    GENERAL: The patient is a well-nourished male, in no acute distress. The vital signs are documented above. CARDIAC: There is a regular rate and rhythm.  VASCULAR: Pulsatile right upper arm fistula with ulcerated area but no open wound.  There is no   erythema or drainage. PULMONARY: Nonlabored respirations ABDOMEN: Soft and non-tender with normal pitched bowel sounds.  MUSCULOSKELETAL: There are no major deformities or cyanosis. NEUROLOGIC: No focal weakness or paresthesias are detected. SKIN: See photo  In chart PSYCHIATRIC: The patient has a normal affect.  STUDIES:   None  ASSESSMENT and PLAN   End-stage renal disease: The patient has a scab over his fistula.  It is not bleeding or infected.  He does have a pulsatile feeling to his fistula.  I discussed proceeding with resection of the ulcerated area and performing a fistulogram to evaluate for central stenosis and intervening if necessary.  I will schedule this electively next week on Thursday.   Wells Colvin Blatt, IV, MD, FACS [facility] [place] Tel [phone] Pager [phone] 

## 2021-01-20 NOTE — ED Triage Notes (Signed)
Emergency Medicine Provider Triage Evaluation Note  HUTTON PELLICANE , a 80 y.o. male  was evaluated in triage.  Pt complains of vascular access problem. Pt was at dialysis pta and they were concerned for infection so sent him here. He finished dialysis pta.   Review of Systems  Positive: Possible fistula infection Negative: fever  Physical Exam  BP 123/67 (BP Location: Left Arm)   Pulse 75   Temp 98.3 F (36.8 C) (Oral)   Resp 15   SpO2 100%  Gen:   Awake, no distress   HEENT:  Atraumatic  Resp:  Normal effort  Cardiac:  Normal rate  Abd:   Nondistended, nontender  MSK:   Moves extremities without difficulty, fistula has chronic wound with scab, no surrounding erythema, warmth or ttp. No active bleeding Neuro:  Speech clear   Medical Decision Making  Medically screening exam initiated at 5:42 PM.  Appropriate orders placed.  [name] was informed that the remainder of the evaluation will be completed by another provider, this initial triage assessment does not replace that evaluation, and the importance of remaining in the ED until their evaluation is complete.  Clinical Impression   MSE was initiated and I personally evaluated the patient and placed orders (if any) at  5:42 PM on January 20, 2021.  The patient appears stable so that the remainder of the MSE may be completed by another provider.    Rodney Booze, [place] 01/20/21 1742

## 2021-01-20 NOTE — Consult Note (Signed)
 [facility] [place]  Patient name: [name] MRN: [id number] DOB: 01/31/1941 Sex: male   REQUESTING PROVIDER:    ER   REASON FOR CONSULT:    Scab over fistula  HISTORY OF PRESENT ILLNESS:   [name] is a 79 y.o. male, who was sent to the ER today after completing dialysis for a scab on his fistula.  The patient states that this has been present for several weeks.  He denies any episodes of bleeding.  He does not have swelling in the arm.  He has not had any fevers.  He is on dialysis Monday Wednesday Friday.  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  The patient is here with family.  He has a history of coronary artery disease.  He has mild dementia.  He is a diabetic.  PAST MEDICAL HISTORY    Past Medical History:  Diagnosis Date  . Allergy   . Anemia   . Arthritis   . Cataract    bil cateracts removed  . Coronary artery disease   . Dementia arising in the senium and presenium (HCC)   . Diabetes mellitus    Type 2  . Diverticulitis   . ED (erectile dysfunction)   . Elevated homocysteine   . 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 GERD (gastroesophageal reflux disease)    pepto   . Gout   . Headache, unspecified 02/25/2015  . Hiatal hernia   . Hyperlipidemia   . Hypertension   . Hypothyroidism   . Pneumonia   . PVD (peripheral vascular disease) (HCC)    has plastic aorta  . Renal insufficiency   . Seasonal allergies   . Sleep apnea    does not wear c-pap     FAMILY HISTORY   Family History  Problem Relation Age of Onset  . Aneurysm Mother        brain  . Heart disease Father   . Stroke Father   . Hypertension Father   . Diabetes Father   . Dementia Neg Hx   . Colon cancer Neg Hx   . Esophageal cancer Neg Hx   . Pancreatic cancer Neg Hx   . Prostate cancer Neg Hx   . Rectal cancer Neg Hx   . Stomach cancer Neg Hx     SOCIAL  HISTORY:   Social History   Socioeconomic History  . Marital status: Married    Spouse name: Cheryl  . Number of children: 1  . Years of education: 16  . Highest education level: Not on file  Occupational History  . Occupation: Retired  Tobacco Use  . Smoking status: Former Smoker    Quit date: 09/24/1994    Years since quitting: 26.3  . Smokeless tobacco: Never Used  Vaping Use  . Vaping Use: Never used  Substance and Sexual Activity  . Alcohol use: No    Alcohol/week: 0.0 standard drinks    Comment: Quit Oct. 1977 ("somewhat heavy")  . Drug use: No  . Sexual activity: Not on file  Other Topics Concern  . Not on file  Social History Narrative   Lives at home with wife.   Caffeine use: Drinks no tea, "a little soda"   Drinks 1 cup coffee/week   Right handed   [employer]   Social Determinants of Health   Financial Resource Strain: Not on file  Food Insecurity: No Food Insecurity  .   Worried About Running Out of Food in the Last Year: Never true  . Ran Out of Food in the Last Year: Never true  Transportation Needs: No Transportation Needs  . Lack of Transportation (Medical): No  . Lack of Transportation (Non-Medical): No  Physical Activity: Not on file  Stress: Not on file  Social Connections: Not on file  Intimate Partner Violence: Not on file    ALLERGIES:    Allergies  Allergen Reactions  . Ambien [Zolpidem Tartrate] Other (See Comments)    Hallucinations and "felt crazy"   . Penicillins Rash and Hives    Has patient had a PCN reaction causing immediate rash, facial/tongue/throat swelling, SOB or lightheadedness with hypotension: Yes Has patient had a PCN reaction causing severe rash involving mucus membranes or skin necrosis: Yes Has patient had a PCN reaction that required hospitalization: No Has patient had a PCN reaction occurring within the last 10 years: No If all of the above answers are "NO", then may proceed with Cephalosporin use.  Other  reaction(s): HIVES    CURRENT MEDICATIONS:    No current facility-administered medications for this encounter.   Current Outpatient Medications  Medication Sig Dispense Refill  . acetaminophen (TYLENOL) 325 MG tablet Take 1-2 tablets (325-650 mg total) by mouth every 4 (four) hours as needed for mild pain.    . albuterol (VENTOLIN HFA) 108 (90 Base) MCG/ACT inhaler Inhale 1 puff into the lungs daily as needed for wheezing or shortness of breath.    . allopurinol (ZYLOPRIM) 100 MG tablet Take 100 mg by mouth daily.    . Blood Glucose Monitoring Suppl (FREESTYLE FREEDOM LITE) w/Device KIT Use to check blood sugar 2 times per day dx code E11.65 1 each 0  . camphor-menthol (SARNA) lotion Apply 1 application topically 4 (four) times daily as needed for itching.     . Carboxymethylcellulose Sodium (THERATEARS) 0.25 % SOLN Place 1 drop into both eyes as needed (for dryness).    . cetirizine (ZYRTEC) 10 MG chewable tablet Chew 10 mg by mouth every Monday, Wednesday, and Friday.    . clobetasol (TEMOVATE) 0.05 % external solution Apply 1 application topically 2 (two) times daily as needed (scalp irritation.).   0  . denosumab (PROLIA) 60 MG/ML SOSY injection Inject 60 mg into the skin every 6 (six) months.    . donepezil (ARICEPT) 10 MG tablet Take 1 tablet (10 mg total) by mouth at bedtime. 90 tablet 3  . FLUOCINOLONE ACETONIDE SCALP 0.01 % OIL Apply 1 application topically daily as needed (scalp irritation.).   0  . fluticasone (FLONASE) 50 MCG/ACT nasal spray Place 1 spray into both nostrils daily. 16 g 2  . glucose blood (FREESTYLE LITE) test strip USE AS DIRECTED THREE TIMES DAILY 300 strip 2  . hydrocortisone 2.5 % cream Apply 1 application topically 3 (three) times daily as needed (skin irritation (legs & arms)).     . insulin aspart (NOVOLOG FLEXPEN) 100 UNIT/ML FlexPen INJECT 6-8 UNITS UNDER THE SKIN THREE TIMES DAILY BEFORE MEALS. DX:E11.65 15 mL 12  . Insulin Pen Needle (PEN NEEDLES) 32G X  4 MM MISC 1 each by Does not apply route 3 (three) times daily. USE PEN NEEDLES TO INJECT INSULIN THREE TIMES DAILY. 100 each 12  . ketoconazole (NIZORAL) 2 % shampoo Apply 1 application topically 2 (two) times a week. 120 mL 0  . Lancets (FREESTYLE) lancets Use as instructed to check blood sugar 2 times per day dx code E11.65 100 each   3  . levothyroxine (SYNTHROID, LEVOTHROID) 50 MCG tablet Take 1 tablet (50 mcg total) by mouth daily. 90 tablet 1  . lidocaine (LIDODERM) 5 % Place 1 patch onto the skin daily as needed (back pain).     . memantine (NAMENDA) 10 MG tablet Take 1 tablet (10 mg total) by mouth 2 (two) times daily. 180 tablet 3  . midodrine (PROAMATINE) 10 MG tablet Take one pill prior to hemodialysis on MWF    . Nutritional Supplements (FEEDING SUPPLEMENT, NEPRO CARB STEADY,) LIQD Take 237 mLs by mouth in the morning and at bedtime.     . omeprazole (PRILOSEC) 40 MG capsule Take 1 capsule (40 mg total) by mouth daily. 30 capsule 0  . sevelamer carbonate (RENVELA) 800 MG tablet Take 1,600-2,400 mg by mouth See admin instructions. Take 2,400 mg by mouth three times a day with meals and 1,600 mg with snacks on Sun/Tues/Thurs/Sat and 2,400 mg with breakfast and dinner and nothing with snacks on Mon/Wed/Fri    . simvastatin (ZOCOR) 20 MG tablet Take 20 mg by mouth at bedtime.    . TRADJENTA 5 MG TABS tablet TAKE 1 TABLET DAILY 90 tablet 3    REVIEW OF SYSTEMS:   [X] denotes positive finding, [ ] denotes negative finding Cardiac  Comments:  Chest pain or chest pressure:    Shortness of breath upon exertion:    Short of breath when lying flat:    Irregular heart rhythm:        Vascular    Pain in calf, thigh, or hip brought on by ambulation:    Pain in feet at night that wakes you up from your sleep:     Blood clot in your veins:    Leg swelling:         Pulmonary    Oxygen at home:    Productive cough:     Wheezing:         Neurologic    Sudden weakness in arms or legs:      Sudden numbness in arms or legs:     Sudden onset of difficulty speaking or slurred speech:    Temporary loss of vision in one eye:     Problems with dizziness:         Gastrointestinal    Blood in stool:      Vomited blood:         Genitourinary    Burning when urinating:     Blood in urine:        Psychiatric    Major depression:         Hematologic    Bleeding problems:    Problems with blood clotting too easily:        Skin    Rashes or ulcers:        Constitutional    Fever or chills:     PHYSICAL EXAM:   Vitals:   01/20/21 1923 01/20/21 1930 01/20/21 2030 01/20/21 2231  BP: (!) 134/49 (!) 146/50 (!) 110/45 (!) 121/41  Pulse: 70 72 70 66  Resp: 17 18 18 20  Temp:    97.6 F (36.4 C)  TempSrc:    Oral  SpO2: 100% 100% 100% 99%    GENERAL: The patient is a well-nourished male, in no acute distress. The vital signs are documented above. CARDIAC: There is a regular rate and rhythm.  VASCULAR: Pulsatile right upper arm fistula with ulcerated area but no open wound.  There is no   erythema or drainage. PULMONARY: Nonlabored respirations ABDOMEN: Soft and non-tender with normal pitched bowel sounds.  MUSCULOSKELETAL: There are no major deformities or cyanosis. NEUROLOGIC: No focal weakness or paresthesias are detected. SKIN: See photo  In chart PSYCHIATRIC: The patient has a normal affect.  STUDIES:   None  ASSESSMENT and PLAN   End-stage renal disease: The patient has a scab over his fistula.  It is not bleeding or infected.  He does have a pulsatile feeling to his fistula.  I discussed proceeding with resection of the ulcerated area and performing a fistulogram to evaluate for central stenosis and intervening if necessary.  I will schedule this electively next week on Thursday.   Wells , IV, MD, FACS [facility] [place] Tel [phone] Pager [phone] 

## 2021-01-20 NOTE — ED Triage Notes (Signed)
Pt bib [facility]. Pt was at dialysis and dialysis staff noted that pt's fistula has an ulceration on it and are concerned for infection. Pt completed dialysis today. Dialysis clamps are still on upon arrival. Pt goes to dialysis center on [facility]. Pt c/o pain in right arm where clamps are.

## 2021-01-20 NOTE — ED Provider Notes (Signed)
[facility] EMERGENCY DEPARTMENT Provider Note   CSN: [id number] Arrival date & time: 01/20/21  1704     History Chief Complaint  Patient presents with  . fistula problem    [name] is a 80 y.o. male.  HPI 80 year old male presents with an abnormal fistula.  Patient states there has been a lesion/scab on it for months.  However today after dialysis the nurses for what ever reason convinced him he had to go to the ER because they were concerned it was infected.  He has not noticed drainage or bleeding.  Fistula seems to be working fine.  No fevers.  It is not painful.  Past Medical History:  Diagnosis Date  . Allergy   . Anemia   . Arthritis   . Cataract    bil cateracts removed  . Coronary artery disease   . Dementia arising in the senium and presenium ([place])   . Diabetes mellitus    Type 2  . Diverticulitis   . ED (erectile dysfunction)   . Elevated homocysteine   . ESRD (end stage renal disease) on dialysis ([place]) 03/2015  . GERD (gastroesophageal reflux disease)    pepto   . Gout   . Headache, unspecified 02/25/2015  . Hiatal hernia   . Hyperlipidemia   . Hypertension   . Hypothyroidism   . Pneumonia   . PVD (peripheral vascular disease) ([place])    has plastic aorta  . Renal insufficiency   . Seasonal allergies   . Sleep apnea    does not wear c-pap    Patient Active Problem List   Diagnosis Date Noted  . Senile nuclear sclerosis 11/22/2020  . Other specified erythematous condition 11/22/2020  . Other specified disease of sebaceous glands 11/22/2020  . Osteoporosis 11/22/2020  . Obesity 11/22/2020  . Macrocytosis 11/22/2020  . Dystrophia unguium 11/22/2020  . Tinea pedis 11/22/2020  . Pre-operative clearance   . Closed nondisplaced intertrochanteric fracture of right femur ([place]) 11/07/2020  . Hip fracture ([place]) 11/07/2020  . Fall   . Rectal bleeding 04/22/2020  . AVM (arteriovenous malformation) of small bowel, acquired   .  Hiatal hernia   . Schatzki's ring of distal esophagus   . Other bacterial infections of unspecified site 08/31/2019  . Hypocalcemia 04/06/2019  . Melena 11/19/2018  . Leukocytosis   . Anemia   . TBI (traumatic brain injury) ([place]) 07/29/2018  . 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 Loose stools   . Dementia arising in the senium and presenium ([place])   . 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 Multiple fractures of ribs, left side, initial encounter for closed fracture 07/21/2018  . Subdural hematoma ([place]) 07/21/2018  . Gastritis, unspecified, with bleeding 06/04/2018  . Angiodysplasia of stomach   . Occult GI bleeding   . GIB (gastrointestinal bleeding) 06/01/2018  . Low back pain 01/17/2018  . Anorexia   . Diverticulitis of intestine, part unspecified, without perforation or abscess without bleeding 08/07/2017  . Dyspnea   . Macrocytic anemia 06/04/2016  . Thrombocytopenia ([place] [facility]) 06/04/2016  . Exertional dyspnea 06/04/2016  . Arm paresthesia, left 06/04/2016  . Dyspnea on exertion 06/04/2016  . Tenderness of right calf 06/04/2016  . Mild cognitive impairment with memory loss 01/12/2016  . ESRD on dialysis ([place]) 05/17/2015  . Hyperlipidemia, unspecified 02/25/2015  . Pruritus, unspecified 02/25/2015  . Pain, unspecified 02/25/2015  . Other specified coagulation defects ([place]) 02/25/2015  .  Iron deficiency anemia, unspecified 02/25/2015  . Headache, unspecified 02/25/2015  . Gout, unspecified 02/25/2015  . Fever, unspecified 02/25/2015  . Diarrhea, unspecified 02/25/2015  . Anemia in chronic kidney disease 02/25/2015  . Acute embolism and thrombosis of left subclavian vein ([place]) 02/25/2015  . Hypoglycemia   . Weakness 02/18/2015  . OSA (obstructive sleep apnea) 02/02/2015  . Hyperkalemia 10/05/2014  . End stage renal disease ([place]) 11/18/2013  . BPH (benign prostatic hyperplasia) 11/22/2012  . Peripheral vascular disease ([place]) 12/24/2011   . Hyperparathyroidism ([place]) 12/24/2011  . Hypothyroidism 12/24/2011  . Allergic rhinitis 12/24/2011  . Erectile dysfunction 12/24/2011  . Insulin dependent diabetes mellitus 09/03/2008  . Essential hypertension 08/30/2008  . PANCREATITIS, HX OF 08/30/2008  . RENAL FAILURE, ACUTE, HX OF 08/30/2008  . DIVERTICULOSIS, COLON 06/08/2003    Past Surgical History:  Procedure Laterality Date  . aortobifemoral bypass    . AV FISTULA PLACEMENT Left 12/01/2013   Procedure: ARTERIOVENOUS (AV) FISTULA CREATION- LEFT BRACHIOCEPHALIC;  Surgeon: Angelia Mould, MD;  Location: [facility];  Service: Vascular;  Laterality: Left;  . [place] TRANSPOSITION Right 07/27/2014   Procedure: BASCILIC VEIN TRANSPOSITION;  Surgeon: Angelia Mould, MD;  Location: [facility];  Service: Vascular;  Laterality: Right;  . BRAIN SURGERY  07/22/2018  . BREAST SURGERY     left - granulomatous mastitis  . BURR HOLE Bilateral 07/22/2018   Procedure: BILATERAL BURR HOLES;  Surgeon: Kristeen Miss, MD;  Location: [facility];  Service: Neurosurgery;  Laterality: Bilateral;  . COLONOSCOPY    . 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 ENTEROSCOPY N/A 06/03/2018   Procedure: ENTEROSCOPY;  Surgeon: Lavena Bullion, DO;  Location: [facility];  Service: Gastroenterology;  Laterality: N/A;  . ENTEROSCOPY N/A 11/20/2018   Procedure: ENTEROSCOPY;  Surgeon: Rush Landmark Telford Nab., MD;  Location[facility] ENDOSCOPY;  Service: Gastroenterology;  Laterality: N/A;  . ENTEROSCOPY N/A 12/03/2019   Procedure: ENTEROSCOPY;  Surgeon: Lavena Bullion, DO;  Location: [facility] ENDOSCOPY;  Service: Gastroenterology;  Laterality: N/A;  push enteroscopy  . EYE SURGERY Bilateral    cataracts  . HEMODIALYSIS INPATIENT  01/17/2018      . HOT HEMOSTASIS N/A 06/03/2018   Procedure: HOT HEMOSTASIS (ARGON PLASMA COAGULATION/BICAP);  Surgeon: Lavena Bullion, DO;  Location: [facility] ENDOSCOPY;  Service: Gastroenterology;  Laterality: N/A;  . HOT  HEMOSTASIS N/A 11/20/2018   Procedure: HOT HEMOSTASIS (ARGON PLASMA COAGULATION/BICAP);  Surgeon: Irving Copas., MD;  Location: [facility];  Service: Gastroenterology;  Laterality: N/A;  . HOT HEMOSTASIS N/A 12/03/2019   Procedure: HOT HEMOSTASIS (ARGON PLASMA COAGULATION/BICAP);  Surgeon: Lavena Bullion, DO;  Location: [facility] ENDOSCOPY;  Service: Gastroenterology;  Laterality: N/A;  . INTRAMEDULLARY (IM) NAIL INTERTROCHANTERIC Right 11/08/2020   Procedure: INTRAMEDULLARY (IM) NAIL INTERTROCHANTRIC;  Surgeon: Erle Crocker, MD;  Location: [facility];  Service: Orthopedics;  Laterality: Right;  . REVISION OF ARTERIOVENOUS GORETEX GRAFT Right 09/01/2019   Procedure: REVISION OF ARTERIOVENOUS FISTULA RIGHT ARM;  Surgeon: Serafina Mitchell, MD;  Location: [facility] OR;  Service: Vascular;  Laterality: Right;  . REVISON OF ARTERIOVENOUS FISTULA Left 02/09/2014   Procedure: REVISON OF LEFT ARTERIOVENOUS FISTULA - RESECTION OF RENDUNDANT VEIN;  Surgeon: Angelia Mould, MD;  Location: [facility];  Service: Vascular;  Laterality: Left;  . SBO with lysis adhesions    . SHUNTOGRAM Left 04/19/2014   Procedure: FISTULOGRAM;  Surgeon: Angelia Mould, MD;  Location: [facility] CATH LAB;  Service: Cardiovascular;  Laterality:  Left;  . UNILATERAL UPPER EXTREMEITY ANGIOGRAM N/A 07/12/2014   Procedure: UNILATERAL UPPER Anselmo Rod;  Surgeon: Angelia Mould, MD;  Location: [facility] CATH LAB;  Service: Cardiovascular;  Laterality: N/A;       Family History  Problem Relation Age of Onset  . Aneurysm Mother        brain  . Heart disease Father   . Stroke Father   . Hypertension Father   . Diabetes Father   . Dementia Neg Hx   . Colon cancer Neg Hx   . Esophageal cancer Neg Hx   . Pancreatic cancer Neg Hx   . Prostate cancer Neg Hx   . Rectal cancer Neg Hx   . Stomach cancer Neg Hx     Social History   Tobacco Use  . Smoking status: Former Smoker    Quit date: 09/24/1994    Years since  quitting: 26.3  . Smokeless tobacco: Never Used  Vaping Use  . Vaping Use: Never used  Substance Use Topics  . Alcohol use: No    Alcohol/week: 0.0 standard drinks    Comment: Quit Oct. 1977 ("somewhat heavy")  . Drug use: No    Home Medications Prior to Admission medications   Medication Sig Start Date End Date Taking? Authorizing Provider  acetaminophen (TYLENOL) 325 MG tablet Take 1-2 tablets (325-650 mg total) by mouth every 4 (four) hours as needed for mild pain. 08/01/18   Love, Ivan Anchors, PA-C  albuterol (VENTOLIN HFA) 108 (90 Base) MCG/ACT inhaler Inhale 1 puff into the lungs daily as needed for wheezing or shortness of breath.    [provider]  allopurinol (ZYLOPRIM) 100 MG tablet Take 100 mg by mouth daily.    [provider]  Blood Glucose Monitoring Suppl (FREESTYLE FREEDOM LITE) w/Device KIT Use to check blood sugar 2 times per day dx code E11.65 10/21/18   Elayne Snare, MD  camphor-menthol [facility]) lotion Apply 1 application topically 4 (four) times daily as needed for itching.     [provider]  Carboxymethylcellulose Sodium (THERATEARS) 0.25 % SOLN Place 1 drop into both eyes as needed (for dryness).    [provider]  cetirizine (ZYRTEC) 10 MG chewable tablet Chew 10 mg by mouth every Monday, Wednesday, and Friday.    [provider]  clobetasol (TEMOVATE) 0.05 % external solution Apply 1 application topically 2 (two) times daily as needed (scalp irritation.).  04/19/15   [provider]  denosumab (PROLIA) 60 MG/ML SOSY injection Inject 60 mg into the skin every 6 (six) months.    [provider]  donepezil (ARICEPT) 10 MG tablet Take 1 tablet (10 mg total) by mouth at bedtime. 07/28/20   Melvenia Beam, MD  FLUOCINOLONE ACETONIDE SCALP 0.01 % OIL Apply 1 application topically daily as needed (scalp irritation.).  04/28/15   [provider]  fluticasone (FLONASE) 50 MCG/ACT nasal spray Place 1 spray into  both nostrils daily. 10/08/14   Mikhail, Velta Addison, DO  glucose blood (FREESTYLE LITE) test strip USE AS DIRECTED THREE TIMES DAILY 01/16/21   Elayne Snare, MD  hydrocortisone 2.5 % cream Apply 1 application topically 3 (three) times daily as needed (skin irritation (legs & arms)).  01/13/19   [provider]  insulin aspart (NOVOLOG FLEXPEN) 100 UNIT/ML FlexPen INJECT 6-8 UNITS UNDER THE SKIN THREE TIMES DAILY BEFORE MEALS. DX:E11.65 11/25/18   Elayne Snare, MD  Insulin Pen Needle (PEN NEEDLES) 32G X 4 MM MISC 1 each by Does  not apply route 3 (three) times daily. USE PEN NEEDLES TO INJECT INSULIN THREE TIMES DAILY. 11/25/18   Elayne Snare, MD  ketoconazole (NIZORAL) 2 % shampoo Apply 1 application topically 2 (two) times a week. 03/31/15   Elby Showers, MD  Lancets (FREESTYLE) lancets Use as instructed to check blood sugar 2 times per day dx code E11.65 02/22/16   Elayne Snare, MD  levothyroxine (SYNTHROID, LEVOTHROID) 50 MCG tablet Take 1 tablet (50 mcg total) by mouth daily. 08/06/14   Elby Showers, MD  lidocaine (LIDODERM) 5 % Place 1 patch onto the skin daily as needed (back pain).  11/12/19   [provider]  memantine (NAMENDA) 10 MG tablet Take 1 tablet (10 mg total) by mouth 2 (two) times daily. 07/28/20   Melvenia Beam, MD  midodrine (PROAMATINE) 10 MG tablet Take one pill prior to hemodialysis on MWF 08/13/18   Love, Ivan Anchors, PA-C  Nutritional Supplements (FEEDING SUPPLEMENT, NEPRO CARB STEADY,) LIQD Take 237 mLs by mouth in the morning and at bedtime.     [provider]  omeprazole (PRILOSEC) 40 MG capsule Take 1 capsule (40 mg total) by mouth daily. 03/13/18   Esterwood, Amy S, PA-C  sevelamer carbonate (RENVELA) 800 MG tablet Take 1,600-2,400 mg by mouth See admin instructions. Take 2,400 mg by mouth three times a day with meals and 1,600 mg with snacks on Sun/Tues/Thurs/Sat and 2,400 mg with breakfast and dinner and nothing with snacks on Mon/Wed/Fri    [provider]  simvastatin (ZOCOR) 20 MG tablet Take 20 mg by mouth at bedtime.    [provider]  TRADJENTA 5 MG TABS tablet TAKE 1 TABLET DAILY 03/01/20   Elayne Snare, MD    Allergies    Ambien [zolpidem tartrate] and Penicillins  Review of Systems   Review of Systems  Constitutional: Negative for fever.  Musculoskeletal: Negative for arthralgias and myalgias.  Skin: Positive for wound.  All other systems reviewed and are negative.   Physical Exam Updated Vital Signs BP 123/67 (BP Location: Left Arm)   Pulse 75   Temp 98.3 F (36.8 C) (Oral)   Resp 15   SpO2 100%   Physical Exam Vitals and nursing note reviewed.  Constitutional:      Appearance: He is well-developed.  HENT:     Head: Normocephalic and atraumatic.     Right Ear: External ear normal.     Left Ear: External ear normal.     Nose: Nose normal.  Eyes:     General:        Right eye: No discharge.        Left eye: No discharge.  Cardiovascular:     Rate and Rhythm: Normal rate and regular rhythm.     Heart sounds: Normal heart sounds.  Pulmonary:     Effort: Pulmonary effort is normal.     Breath sounds: Normal breath sounds.  Abdominal:     General: There is no distension.     Palpations: Abdomen is soft.     Tenderness: There is no abdominal tenderness.  Musculoskeletal:     Cervical back: Neck supple.     Comments: See picture. No fluctuance, increased warmth or tenderness  Skin:    General: Skin is warm and dry.  Neurological:     Mental Status: He is alert.  Psychiatric:        Mood and Affect: Mood is not anxious.       ED  Results / Procedures / Treatments   Labs (all labs ordered are listed, but only abnormal results are displayed) Labs Reviewed  CBC WITH DIFFERENTIAL/PLATELET - Abnormal; Notable for the following components:      Result Value   RBC 3.23 (*)    Hemoglobin 10.2 (*)    HCT 31.6 (*)    RDW 19.1 (*)    nRBC 1.4 (*)    All other components within normal  limits  BASIC METABOLIC PANEL - Abnormal; Notable for the following components:   Potassium 3.1 (*)    Chloride 97 (*)    Creatinine, Ser 3.45 (*)    Calcium 8.6 (*)    GFR, Estimated 17 (*)    All other components within normal limits    EKG None  Radiology No results found.  Procedures Procedures   Medications Ordered in ED Medications - No data to display  ED Course  I have reviewed the triage vital signs and the nursing notes.  Pertinent labs & imaging results that were available during my care of the patient were reviewed by me and considered in my medical decision making (see chart for details).    MDM Rules/Calculators/A&P                          Patient's vital signs are stable.  I discussed with Dr. Trula Slade, who has seen patient and it appears this will need outpatient surgery.  No antibiotics warranted, had a similar thing happened in 2020.  Labs are unremarkable compared to baseline.  At this point he is stable for discharge home to follow-up with vascular surgery next week for OR. Final Clinical Impression(s) / ED Diagnoses Final diagnoses:  Skin ulcer, limited to breakdown of skin [facility])    Rx / DC Orders ED Discharge Orders    None       Sherwood Gambler, MD 01/20/21 2231

## 2021-01-20 NOTE — ED Notes (Signed)
ED Provider at bedside. 

## 2021-01-22 DIAGNOSIS — N186 End stage renal disease: Secondary | ICD-10-CM | POA: Diagnosis not present

## 2021-01-22 DIAGNOSIS — E1122 Type 2 diabetes mellitus with diabetic chronic kidney disease: Secondary | ICD-10-CM | POA: Diagnosis not present

## 2021-01-22 DIAGNOSIS — Z992 Dependence on renal dialysis: Secondary | ICD-10-CM | POA: Diagnosis not present

## 2021-01-23 ENCOUNTER — Other Ambulatory Visit: Payer: Self-pay | Admitting: *Deleted

## 2021-01-23 DIAGNOSIS — N2581 Secondary hyperparathyroidism of renal origin: Secondary | ICD-10-CM | POA: Diagnosis not present

## 2021-01-23 DIAGNOSIS — Z992 Dependence on renal dialysis: Secondary | ICD-10-CM | POA: Diagnosis not present

## 2021-01-23 DIAGNOSIS — N186 End stage renal disease: Secondary | ICD-10-CM | POA: Diagnosis not present

## 2021-01-23 DIAGNOSIS — E785 Hyperlipidemia, unspecified: Secondary | ICD-10-CM | POA: Diagnosis not present

## 2021-01-23 DIAGNOSIS — E1129 Type 2 diabetes mellitus with other diabetic kidney complication: Secondary | ICD-10-CM | POA: Diagnosis not present

## 2021-01-23 DIAGNOSIS — D631 Anemia in chronic kidney disease: Secondary | ICD-10-CM | POA: Diagnosis not present

## 2021-01-23 DIAGNOSIS — E876 Hypokalemia: Secondary | ICD-10-CM | POA: Diagnosis not present

## 2021-01-23 DIAGNOSIS — D509 Iron deficiency anemia, unspecified: Secondary | ICD-10-CM | POA: Diagnosis not present

## 2021-01-24 ENCOUNTER — Other Ambulatory Visit (HOSPITAL_COMMUNITY)
Admission: RE | Admit: 2021-01-24 | Discharge: 2021-01-24 | Disposition: A | Discharge status: Home / Self Care | Payer: Medicare Other | Source: Ambulatory Visit | Attending: Surgery | Admitting: Surgery

## 2021-01-24 ENCOUNTER — Telehealth: Payer: Self-pay | Admitting: Nurse Practitioner

## 2021-01-24 ENCOUNTER — Telehealth: Payer: Self-pay | Admitting: Internal Medicine

## 2021-01-24 DIAGNOSIS — Z20822 Contact with and (suspected) exposure to covid-19: Secondary | ICD-10-CM | POA: Diagnosis not present

## 2021-01-24 DIAGNOSIS — E1122 Type 2 diabetes mellitus with diabetic chronic kidney disease: Secondary | ICD-10-CM | POA: Diagnosis not present

## 2021-01-24 DIAGNOSIS — S72144D Nondisplaced intertrochanteric fracture of right femur, subsequent encounter for closed fracture with routine healing: Secondary | ICD-10-CM | POA: Diagnosis not present

## 2021-01-24 DIAGNOSIS — G309 Alzheimer's disease, unspecified: Secondary | ICD-10-CM | POA: Insufficient documentation

## 2021-01-24 DIAGNOSIS — F0390 Unspecified dementia without behavioral disturbance: Secondary | ICD-10-CM | POA: Diagnosis not present

## 2021-01-24 DIAGNOSIS — Z01812 Encounter for preprocedural laboratory examination: Secondary | ICD-10-CM | POA: Insufficient documentation

## 2021-01-24 DIAGNOSIS — N186 End stage renal disease: Secondary | ICD-10-CM | POA: Diagnosis not present

## 2021-01-24 DIAGNOSIS — I120 Hypertensive chronic kidney disease with stage 5 chronic kidney disease or end stage renal disease: Secondary | ICD-10-CM | POA: Diagnosis not present

## 2021-01-24 DIAGNOSIS — D631 Anemia in chronic kidney disease: Secondary | ICD-10-CM | POA: Diagnosis not present

## 2021-01-24 NOTE — Telephone Encounter (Signed)
[facility]  Marzetta Board called to see if it would be okay to increase patient back to 2 times a week, he is starting to show improvement.

## 2021-01-24 NOTE — Telephone Encounter (Signed)
Called patient's home number to offer to schedule a Palliative Consult, no answer but rec'd a message stating:  "Enter System Code", unable to leave a message.  I then called wife's cell number with no answer - left message with reason for call along with my name and call back number.

## 2021-01-25 ENCOUNTER — Other Ambulatory Visit: Payer: Self-pay

## 2021-01-25 ENCOUNTER — Encounter (HOSPITAL_COMMUNITY): Payer: Self-pay | Admitting: Surgery

## 2021-01-25 ENCOUNTER — Telehealth: Payer: Self-pay | Admitting: Nurse Practitioner

## 2021-01-25 DIAGNOSIS — Z992 Dependence on renal dialysis: Secondary | ICD-10-CM | POA: Diagnosis not present

## 2021-01-25 DIAGNOSIS — D509 Iron deficiency anemia, unspecified: Secondary | ICD-10-CM | POA: Diagnosis not present

## 2021-01-25 DIAGNOSIS — E785 Hyperlipidemia, unspecified: Secondary | ICD-10-CM | POA: Diagnosis not present

## 2021-01-25 DIAGNOSIS — N186 End stage renal disease: Secondary | ICD-10-CM | POA: Diagnosis not present

## 2021-01-25 DIAGNOSIS — N2581 Secondary hyperparathyroidism of renal origin: Secondary | ICD-10-CM | POA: Diagnosis not present

## 2021-01-25 LAB — SARS CORONAVIRUS 2 (TAT 6-24 HRS): SARS Coronavirus 2: NEGATIVE

## 2021-01-25 NOTE — Anesthesia Preprocedure Evaluation (Addendum)
Anesthesia Evaluation  Patient identified by MRN, date of birth, ID band Patient awake    Reviewed: Allergy & Precautions, H&P , NPO status , Patient's Chart, lab work & pertinent test results, reviewed documented beta blocker date and time   Airway Mallampati: II  TM Distance: >3 FB Neck ROM: full    Dental no notable dental hx. (+) Edentulous Upper, Poor Dentition, Missing, Dental Advisory Given,    Pulmonary sleep apnea , former smoker,    Pulmonary exam normal breath sounds clear to auscultation       Cardiovascular Exercise Tolerance: Good hypertension, Pt. on medications + CAD and + Peripheral Vascular Disease   Rhythm:regular Rate:Normal     Neuro/Psych  Headaches, PSYCHIATRIC DISORDERS Dementia    GI/Hepatic Neg liver ROS, hiatal hernia, GERD  Medicated,  Endo/Other  diabetes, Insulin DependentHypothyroidism   Renal/GU ESRFRenal disease  negative genitourinary   Musculoskeletal  (+) Arthritis , Osteoarthritis,    Abdominal   Peds negative pediatric ROS (+)  Hematology  (+) Blood dyscrasia, anemia ,   Anesthesia Other Findings   Reproductive/Obstetrics negative OB ROS                            Anesthesia Physical Anesthesia Plan  ASA: IV  Anesthesia Plan: General   Post-op Pain Management:    Induction: Intravenous  PONV Risk Score and Plan: 2 and Ondansetron and Treatment may vary due to age or medical condition  Airway Management Planned: LMA and Oral ETT  Additional Equipment: None  Intra-op Plan:   Post-operative Plan:   Informed Consent: I have reviewed the patients History and Physical, chart, labs and discussed the procedure including the risks, benefits and alternatives for the proposed anesthesia with the patient or authorized representative who has indicated his/her understanding and acceptance.     Dental Advisory Given  Plan Discussed with: CRNA and  Anesthesiologist  Anesthesia Plan Comments: (PAT note by Karoline Caldwell, PA-C: Pertinent history includes severe dementia, ESRD-HD on MWF, HTN, IDDM, diabetic neuropathy, hypothyroidism, bilateral subdural hematomas requiring evacuation October 2019.  He was recently seen in the [facility] fib clinic for evaluation of irregular rhythm.  Per note by Malka So, PA-C 01/17/2021, "He was seen at the ED 01/12/21 with SOB. Per EMS report, he had several seconds of an irregular rhythm which were felt to be afib. On personal review, it appears more consistent with SR with PACs. ECG and telemetry at ED did not show any afib. Admission was offered but patient declined."  Given history of severe dementia, bilateral subdural hematomas, and no definitive diagnosis of A. fib, he was not recommended to start anticoagulation at that time.  Recommended patient wear ZIO monitor for 2 weeks to evaluate for possible arrhythmias.  Patient is currently wearing this.  PAD s/p remote history of aortobifemoral bypass grafting.  History of OSA, not on CPAP.  IDDM 2, last A1c 4.9 on 11/08/2020.  BMP and CBC from 01/20/2021 reviewed, creatinine 3.45 consistent with ESRD, potassium mildly low at 3.1, mild anemia with hemoglobin 10.2, otherwise unremarkable.  Will need day of surgery labs and evaluation.  EKG 01/17/2021: Ectopic atrial rhythm.  Rate 82. Low voltage QRS. Cannot rule out Anterior infarct , age undetermined. No significant change since last tracing  TTE 11/07/2020: 1. Left ventricular ejection fraction, by estimation, is 55 to 60%. The  left ventricle has normal function. The left ventricle has no regional  wall motion abnormalities. There  is mild left ventricular hypertrophy.  Left ventricular diastolic parameters  are consistent with Grade I diastolic dysfunction (impaired relaxation).  2. Right ventricular systolic function is normal. The right ventricular  size is normal. There is mildly elevated pulmonary  artery systolic  pressure. The estimated right ventricular systolic pressure is 40.1 mmHg.  3. The mitral valve is normal in structure. Trivial mitral valve  regurgitation. No evidence of mitral stenosis.  4. The aortic valve is tricuspid. Aortic valve regurgitation is not  visualized. Mild aortic valve sclerosis is present, with no evidence of  aortic valve stenosis.  5. The inferior vena cava is normal in size with greater than 50%  respiratory variability, suggesting right atrial pressure of 3 mmHg.   )       Anesthesia Quick Evaluation

## 2021-01-25 NOTE — Progress Notes (Signed)
Anesthesia Chart Review: Same day workup  Pertinent history includes severe dementia, ESRD-HD on MWF, HTN, IDDM, diabetic neuropathy, hypothyroidism, bilateral subdural hematomas requiring evacuation October 2019.  He was recently seen in the [facility] fib clinic for evaluation of irregular rhythm.  Per note by Malka So, PA-C 01/17/2021, "He was seen at the ED 01/12/21 with SOB. Per EMS report, he had several seconds of an irregular rhythm which were felt to be afib. On personal review, it appears more consistent with SR with PACs. ECG and telemetry at ED did not show any afib. Admission was offered but patient declined."  Given history of severe dementia, bilateral subdural hematomas, and no definitive diagnosis of A. fib, he was not recommended to start anticoagulation at that time.  Recommended patient wear ZIO monitor for 2 weeks to evaluate for possible arrhythmias.  Patient is currently wearing this.  PAD s/p remote history of aortobifemoral bypass grafting.  History of OSA, not on CPAP.  IDDM 2, last A1c 4.9 on 11/08/2020.  BMP and CBC from 01/20/2021 reviewed, creatinine 3.45 consistent with ESRD, potassium mildly low at 3.1, mild anemia with hemoglobin 10.2, otherwise unremarkable.  Will need day of surgery labs and evaluation.  EKG 01/17/2021: Ectopic atrial rhythm.  Rate 82. Low voltage QRS. Cannot rule out Anterior infarct , age undetermined. No significant change since last tracing  TTE 11/07/2020: 1. Left ventricular ejection fraction, by estimation, is 55 to 60%. The  left ventricle has normal function. The left ventricle has no regional  wall motion abnormalities. There is mild left ventricular hypertrophy.  Left ventricular diastolic parameters  are consistent with Grade I diastolic dysfunction (impaired relaxation).  2. Right ventricular systolic function is normal. The right ventricular  size is normal. There is mildly elevated pulmonary artery systolic  pressure. The  estimated right ventricular systolic pressure is 70.0 mmHg.  3. The mitral valve is normal in structure. Trivial mitral valve  regurgitation. No evidence of mitral stenosis.  4. The aortic valve is tricuspid. Aortic valve regurgitation is not  visualized. Mild aortic valve sclerosis is present, with no evidence of  aortic valve stenosis.  5. The inferior vena cava is normal in size with greater than 50%  respiratory variability, suggesting right atrial pressure of 3 mmHg.    Wynonia Musty [facility] Short Stay Center/Anesthesiology Phone [phone] 01/25/2021 3:50 PM

## 2021-01-25 NOTE — Telephone Encounter (Signed)
Returned call to wife, Om [place], and spoke with her about the Palliative referral/services and all questions were answered and she was in agreement with scheduling a visit.  She also requested a brochure regarding Palliative so that she could look over this, I have emailed her a copy of the Palliative brochure.  I have scheduled an In-home Consult for 02/21/21 @ 1:30 PM

## 2021-01-25 NOTE — Progress Notes (Signed)
Spoke with wife [name] for [employer].  [name] states patient does not have any shortness of breath, fever, cough or chest pain.  PCP - Dr Tedra Senegal Cardiologist - Malka So, [place] Neu[facility] - Dr Sarina Ill Endocrinology - Dr Dwyane Dee   Chest x-ray - 01/12/21 EKG - 01/17/21 Stress Test - Yes but unsure of date ECHO - 11/07/20 Cardiac Cath - n/a  Sleep Study -  Yes CPAP - does not use cpap  Fasting Blood Sugar - 75-95s Checks Blood Sugar 2-3 times a day  . Do not take Tradjenta on the morning of surgery.     . THE MORNING OF SURGERY,do not take Novolog Insulin unless your CBG is greater than 220 mg/dL.  If greater than 220, you may take  of your sliding scale (correction) dose of insulin.  . If your blood sugar is less than 70 mg/dL, you will need to treat for low blood sugar: o Treat a low blood sugar (less than 70 mg/dL) with  cup of clear juice (cranberry or apple), 4 glucose tablets, OR glucose gel. o Recheck blood sugar in 15 minutes after treatment (to make sure it is greater than 70 mg/dL). If your blood sugar is not greater than 70 mg/dL on recheck, call [phone] for further instructions.  Anesthesia review: Yes.  Patient is wearing a heart monitor x 2 weeks until 01/31/21 (checking a-fib).  Informed Ebony Hail, PA of heart monitor.  STOP now taking any Aspirin (unless otherwise instructed by your surgeon), Aleve, Naproxen, Ibuprofen, Motrin, Advil, Goody's, BC's, all herbal medications, fish oil, and all vitamins.   Coronavirus Screening Covid test on 01/24/21 was negative.  Patient verbalized understanding of instructions that were given via phone.

## 2021-01-25 NOTE — Telephone Encounter (Signed)
Faxed signed orders to [facility] (new names [facility]) [phone] phone [phone]  Order [phone]  To increase PT 01/29/2021 2wk3, 1wk1

## 2021-01-26 ENCOUNTER — Ambulatory Visit (HOSPITAL_COMMUNITY): Payer: Medicare Other | Admitting: Physician Assistant

## 2021-01-26 ENCOUNTER — Encounter (HOSPITAL_COMMUNITY): Payer: Self-pay | Admitting: Surgery

## 2021-01-26 ENCOUNTER — Ambulatory Visit (HOSPITAL_COMMUNITY)
Admission: RE | Admit: 2021-01-26 | Discharge: 2021-01-26 | Disposition: A | Discharge status: Home / Self Care | Payer: Medicare Other | Attending: Surgery | Admitting: Surgery

## 2021-01-26 ENCOUNTER — Encounter (HOSPITAL_COMMUNITY)
Admission: RE | Disposition: A | Discharge status: Home / Self Care | Payer: Self-pay | Source: Home / Self Care | Attending: Surgery

## 2021-01-26 ENCOUNTER — Ambulatory Visit (HOSPITAL_COMMUNITY): Payer: Medicare Other

## 2021-01-26 DIAGNOSIS — F0390 Unspecified dementia without behavioral disturbance: Secondary | ICD-10-CM | POA: Diagnosis not present

## 2021-01-26 DIAGNOSIS — Z794 Long term (current) use of insulin: Secondary | ICD-10-CM | POA: Diagnosis not present

## 2021-01-26 DIAGNOSIS — Z79899 Other long term (current) drug therapy: Secondary | ICD-10-CM | POA: Diagnosis not present

## 2021-01-26 DIAGNOSIS — D631 Anemia in chronic kidney disease: Secondary | ICD-10-CM | POA: Diagnosis not present

## 2021-01-26 DIAGNOSIS — G4733 Obstructive sleep apnea (adult) (pediatric): Secondary | ICD-10-CM | POA: Diagnosis not present

## 2021-01-26 DIAGNOSIS — I120 Hypertensive chronic kidney disease with stage 5 chronic kidney disease or end stage renal disease: Secondary | ICD-10-CM | POA: Diagnosis not present

## 2021-01-26 DIAGNOSIS — E039 Hypothyroidism, unspecified: Secondary | ICD-10-CM | POA: Diagnosis not present

## 2021-01-26 DIAGNOSIS — Z888 Allergy status to other drugs, medicaments and biological substances status: Secondary | ICD-10-CM | POA: Diagnosis not present

## 2021-01-26 DIAGNOSIS — I2510 Atherosclerotic heart disease of native coronary artery without angina pectoris: Secondary | ICD-10-CM | POA: Insufficient documentation

## 2021-01-26 DIAGNOSIS — Z880 Allergy status to penicillin: Secondary | ICD-10-CM | POA: Insufficient documentation

## 2021-01-26 DIAGNOSIS — N186 End stage renal disease: Secondary | ICD-10-CM | POA: Insufficient documentation

## 2021-01-26 DIAGNOSIS — Z79890 Hormone replacement therapy (postmenopausal): Secondary | ICD-10-CM | POA: Diagnosis not present

## 2021-01-26 DIAGNOSIS — Z87891 Personal history of nicotine dependence: Secondary | ICD-10-CM | POA: Diagnosis not present

## 2021-01-26 DIAGNOSIS — D696 Thrombocytopenia, unspecified: Secondary | ICD-10-CM | POA: Diagnosis not present

## 2021-01-26 DIAGNOSIS — Z7982 Long term (current) use of aspirin: Secondary | ICD-10-CM | POA: Diagnosis not present

## 2021-01-26 DIAGNOSIS — Z7984 Long term (current) use of oral hypoglycemic drugs: Secondary | ICD-10-CM | POA: Diagnosis not present

## 2021-01-26 DIAGNOSIS — D509 Iron deficiency anemia, unspecified: Secondary | ICD-10-CM | POA: Diagnosis not present

## 2021-01-26 DIAGNOSIS — Z992 Dependence on renal dialysis: Secondary | ICD-10-CM | POA: Diagnosis not present

## 2021-01-26 DIAGNOSIS — R1312 Dysphagia, oropharyngeal phase: Secondary | ICD-10-CM | POA: Diagnosis not present

## 2021-01-26 DIAGNOSIS — S72144D Nondisplaced intertrochanteric fracture of right femur, subsequent encounter for closed fracture with routine healing: Secondary | ICD-10-CM | POA: Diagnosis not present

## 2021-01-26 DIAGNOSIS — T82898A Other specified complication of vascular prosthetic devices, implants and grafts, initial encounter: Secondary | ICD-10-CM | POA: Diagnosis not present

## 2021-01-26 DIAGNOSIS — I739 Peripheral vascular disease, unspecified: Secondary | ICD-10-CM | POA: Diagnosis not present

## 2021-01-26 DIAGNOSIS — E1122 Type 2 diabetes mellitus with diabetic chronic kidney disease: Secondary | ICD-10-CM | POA: Insufficient documentation

## 2021-01-26 HISTORY — PX: REVISON OF ARTERIOVENOUS FISTULA: SHX6074

## 2021-01-26 HISTORY — PX: FISTULOGRAM: SHX5832

## 2021-01-26 HISTORY — PX: ANGIOPLASTY: SHX39

## 2021-01-26 LAB — GLUCOSE, CAPILLARY
Glucose-Capillary: 84 mg/dL (ref 70–99)
Glucose-Capillary: 87 mg/dL (ref 70–99)
Glucose-Capillary: 86 mg/dL (ref 70–99)

## 2021-01-26 LAB — POCT I-STAT, CHEM 8
BUN: 22 mg/dL (ref 8–23)
Calcium, Ion: 1.11 mmol/L — ABNORMAL LOW (ref 1.15–1.40)
Chloride: 99 mmol/L (ref 98–111)
Creatinine, Ser: 4.9 mg/dL — ABNORMAL HIGH (ref 0.61–1.24)
Glucose, Bld: 85 mg/dL (ref 70–99)
HCT: 31 % — ABNORMAL LOW (ref 39.0–52.0)
Hemoglobin: 10.5 g/dL — ABNORMAL LOW (ref 13.0–17.0)
Potassium: 3.9 mmol/L (ref 3.5–5.1)
Sodium: 137 mmol/L (ref 135–145)
TCO2: 30 mmol/L (ref 22–32)

## 2021-01-26 SURGERY — REVISON OF ARTERIOVENOUS FISTULA
Anesthesia: General | Site: Arm Upper | Laterality: Right

## 2021-01-26 MED ORDER — IODIXANOL 320 MG/ML IV SOLN
INTRAVENOUS | Status: DC | PRN
  Administered 2021-01-26: 11 mL via INTRAVENOUS

## 2021-01-26 MED ORDER — ACETAMINOPHEN 160 MG/5ML PO SOLN: 325.0000 mg | ORAL | Status: DC | PRN

## 2021-01-26 MED ORDER — HEPARIN SODIUM (PORCINE) 1000 UNIT/ML IJ SOLN
INTRAMUSCULAR | Status: AC
  Filled 2021-01-26: qty 1

## 2021-01-26 MED ORDER — LIDOCAINE 2% (20 MG/ML) 5 ML SYRINGE
INTRAMUSCULAR | Status: DC | PRN
  Administered 2021-01-26: 100 mg via INTRAVENOUS

## 2021-01-26 MED ORDER — HYDROCODONE-ACETAMINOPHEN 5-325 MG PO TABS: 1.0000 | ORAL_TABLET | ORAL | 0 refills | Status: DC | PRN

## 2021-01-26 MED ORDER — OXYCODONE HCL 5 MG/5ML PO SOLN: 5.0000 mg | Freq: Once | ORAL | Status: AC | PRN

## 2021-01-26 MED ORDER — PHENYLEPHRINE HCL-NACL 10-0.9 MG/250ML-% IV SOLN
INTRAVENOUS | Status: DC | PRN
  Administered 2021-01-26: 60 ug/min via INTRAVENOUS

## 2021-01-26 MED ORDER — MEPERIDINE HCL 25 MG/ML IJ SOLN: 6.2500 mg | INTRAMUSCULAR | Status: DC | PRN

## 2021-01-26 MED ORDER — ONDANSETRON HCL 4 MG/2ML IJ SOLN: 4.0000 mg | Freq: Once | INTRAMUSCULAR | Status: DC | PRN

## 2021-01-26 MED ORDER — PROPOFOL 10 MG/ML IV BOLUS
INTRAVENOUS | Status: DC | PRN
  Administered 2021-01-26: 100 mg via INTRAVENOUS

## 2021-01-26 MED ORDER — CHLORHEXIDINE GLUCONATE 4 % EX LIQD: 60.0000 mL | Freq: Once | CUTANEOUS | Status: DC

## 2021-01-26 MED ORDER — PHENYLEPHRINE 40 MCG/ML (10ML) SYRINGE FOR IV PUSH (FOR BLOOD PRESSURE SUPPORT)
PREFILLED_SYRINGE | INTRAVENOUS | Status: AC
  Filled 2021-01-26: qty 10

## 2021-01-26 MED ORDER — PROTAMINE SULFATE 10 MG/ML IV SOLN
INTRAVENOUS | Status: DC | PRN
  Administered 2021-01-26: 25 mg via INTRAVENOUS

## 2021-01-26 MED ORDER — HEPARIN SODIUM (PORCINE) 1000 UNIT/ML IJ SOLN
INTRAMUSCULAR | Status: DC | PRN
  Administered 2021-01-26: 3000 [IU] via INTRAVENOUS

## 2021-01-26 MED ORDER — SODIUM CHLORIDE 0.9 % IV SOLN
INTRAVENOUS | Status: DC | PRN
  Administered 2021-01-26: 500 mL

## 2021-01-26 MED ORDER — PHENYLEPHRINE 40 MCG/ML (10ML) SYRINGE FOR IV PUSH (FOR BLOOD PRESSURE SUPPORT)
PREFILLED_SYRINGE | INTRAVENOUS | Status: DC | PRN
  Administered 2021-01-26 (×2): 80 ug via INTRAVENOUS

## 2021-01-26 MED ORDER — PROPOFOL 10 MG/ML IV BOLUS
INTRAVENOUS | Status: AC
  Filled 2021-01-26: qty 20

## 2021-01-26 MED ORDER — OXYCODONE HCL 5 MG PO TABS
5.0000 mg | ORAL_TABLET | Freq: Once | ORAL | Status: AC | PRN
  Administered 2021-01-26: 5 mg via ORAL

## 2021-01-26 MED ORDER — ONDANSETRON HCL 4 MG/2ML IJ SOLN
INTRAMUSCULAR | Status: AC
  Filled 2021-01-26: qty 2

## 2021-01-26 MED ORDER — LIDOCAINE 2% (20 MG/ML) 5 ML SYRINGE
INTRAMUSCULAR | Status: AC
  Filled 2021-01-26: qty 5

## 2021-01-26 MED ORDER — 0.9 % SODIUM CHLORIDE (POUR BTL) OPTIME
TOPICAL | Status: DC | PRN
  Administered 2021-01-26: 1000 mL

## 2021-01-26 MED ORDER — FENTANYL CITRATE (PF) 250 MCG/5ML IJ SOLN
INTRAMUSCULAR | Status: AC
  Filled 2021-01-26: qty 5

## 2021-01-26 MED ORDER — FENTANYL CITRATE (PF) 100 MCG/2ML IJ SOLN: 25.0000 ug | INTRAMUSCULAR | Status: DC | PRN

## 2021-01-26 MED ORDER — EPHEDRINE SULFATE-NACL 50-0.9 MG/10ML-% IV SOSY
PREFILLED_SYRINGE | INTRAVENOUS | Status: DC | PRN
  Administered 2021-01-26 (×2): 10 mg via INTRAVENOUS

## 2021-01-26 MED ORDER — SODIUM CHLORIDE 0.9 % IV SOLN: INTRAVENOUS | Status: DC

## 2021-01-26 MED ORDER — EPHEDRINE 5 MG/ML INJ
INTRAVENOUS | Status: AC
  Filled 2021-01-26: qty 10

## 2021-01-26 MED ORDER — DEXAMETHASONE SODIUM PHOSPHATE 10 MG/ML IJ SOLN
INTRAMUSCULAR | Status: DC | PRN
  Administered 2021-01-26: 5 mg via INTRAVENOUS

## 2021-01-26 MED ORDER — FENTANYL CITRATE (PF) 250 MCG/5ML IJ SOLN
INTRAMUSCULAR | Status: DC | PRN
  Administered 2021-01-26 (×3): 50 ug via INTRAVENOUS

## 2021-01-26 MED ORDER — OXYCODONE HCL 5 MG PO TABS
ORAL_TABLET | ORAL | Status: AC
  Filled 2021-01-26: qty 1

## 2021-01-26 MED ORDER — ONDANSETRON HCL 4 MG/2ML IJ SOLN
INTRAMUSCULAR | Status: DC | PRN
  Administered 2021-01-26: 4 mg via INTRAVENOUS

## 2021-01-26 MED ORDER — CHLORHEXIDINE GLUCONATE 0.12 % MT SOLN
15.0000 mL | Freq: Once | OROMUCOSAL | Status: AC
  Administered 2021-01-26: 15 mL via OROMUCOSAL
  Filled 2021-01-26: qty 15

## 2021-01-26 MED ORDER — ACETAMINOPHEN 325 MG PO TABS: 325.0000 mg | ORAL_TABLET | ORAL | Status: DC | PRN

## 2021-01-26 MED ORDER — SODIUM CHLORIDE 0.9 % IV SOLN
INTRAVENOUS | Status: AC
  Filled 2021-01-26: qty 1.2

## 2021-01-26 MED ORDER — ORAL CARE MOUTH RINSE: 15.0000 mL | Freq: Once | OROMUCOSAL | Status: AC

## 2021-01-26 MED ORDER — VANCOMYCIN HCL IN DEXTROSE 1-5 GM/200ML-% IV SOLN
1000.0000 mg | INTRAVENOUS | Status: AC
  Administered 2021-01-26: 1000 mg via INTRAVENOUS
  Filled 2021-01-26: qty 200

## 2021-01-26 MED ORDER — LIDOCAINE-EPINEPHRINE 1 %-1:100000 IJ SOLN
INTRAMUSCULAR | Status: AC
  Filled 2021-01-26: qty 2

## 2021-01-26 SURGICAL SUPPLY — 63 items
ADH SKN CLS APL DERMABOND .7 (GAUZE/BANDAGES/DRESSINGS) ×1
ARMBAND PINK RESTRICT EXTREMIT (MISCELLANEOUS) ×2 IMPLANT
BAG BANDED W/RUBBER/TAPE 36X54 (MISCELLANEOUS) ×2 IMPLANT
BAG EQP BAND 135X91 W/RBR TAPE (MISCELLANEOUS) ×1
BALLN MUSTANG 12X60X75 (BALLOONS) ×2
BALLOON MUSTANG 12X60X75 (BALLOONS) IMPLANT
BNDG ELASTIC 4X5.8 VLCR STR LF (GAUZE/BANDAGES/DRESSINGS) ×1 IMPLANT
BNDG GAUZE ELAST 4 BULKY (GAUZE/BANDAGES/DRESSINGS) ×1 IMPLANT
CANISTER SUCT 3000ML PPV (MISCELLANEOUS) ×2 IMPLANT
CATH ANGIO 5F BER2 65CM (CATHETERS) ×1 IMPLANT
CHLORAPREP W/TINT 10.5 ML (MISCELLANEOUS) ×2 IMPLANT
CLIP VESOCCLUDE MED 6/CT (CLIP) ×2 IMPLANT
CLIP VESOCCLUDE SM WIDE 6/CT (CLIP) ×2 IMPLANT
COVER DOME SNAP 22 D (MISCELLANEOUS) ×2 IMPLANT
COVER PROBE W GEL 5X96 (DRAPES) IMPLANT
COVER WAND RF STERILE (DRAPES) ×2 IMPLANT
DERMABOND ADVANCED (GAUZE/BANDAGES/DRESSINGS) ×1
DERMABOND ADVANCED .7 DNX12 (GAUZE/BANDAGES/DRESSINGS) ×1 IMPLANT
DRAPE BRACHIAL (DRAPES) ×2 IMPLANT
DRSG TEGADERM 4X4.75 (GAUZE/BANDAGES/DRESSINGS) ×2 IMPLANT
ELECT REM PT RETURN 9FT ADLT (ELECTROSURGICAL) ×2
ELECTRODE REM PT RTRN 9FT ADLT (ELECTROSURGICAL) ×1 IMPLANT
GAUZE SPONGE 4X4 12PLY STRL (GAUZE/BANDAGES/DRESSINGS) ×1 IMPLANT
GEL ULTRASOUND 8.5O AQUASONIC (MISCELLANEOUS) ×2 IMPLANT
GLOVE BIOGEL PI IND STRL 7.5 (GLOVE) ×1 IMPLANT
GLOVE BIOGEL PI INDICATOR 7.5 (GLOVE) ×1
GLOVE SRG 8 PF TXTR STRL LF DI (GLOVE) ×1 IMPLANT
GLOVE SURG POLYISO LF SZ7.5 (GLOVE) ×1 IMPLANT
GLOVE SURG SS PI 7.5 STRL IVOR (GLOVE) ×2 IMPLANT
GLOVE SURG UNDER POLY LF SZ8 (GLOVE) ×2
GOWN STRL REUS W/ TWL LRG LVL3 (GOWN DISPOSABLE) ×3 IMPLANT
GOWN STRL REUS W/ TWL XL LVL3 (GOWN DISPOSABLE) ×1 IMPLANT
GOWN STRL REUS W/TWL LRG LVL3 (GOWN DISPOSABLE) ×6
GOWN STRL REUS W/TWL XL LVL3 (GOWN DISPOSABLE) ×2
GUIDEWIRE BENTSON (WIRE) ×1 IMPLANT
HEMOSTAT SNOW SURGICEL 2X4 (HEMOSTASIS) IMPLANT
KIT BASIN OR (CUSTOM PROCEDURE TRAY) ×2 IMPLANT
KIT ENCORE 26 ADVANTAGE (KITS) ×1 IMPLANT
KIT TURNOVER KIT B (KITS) ×2 IMPLANT
NDL PERC 18GX7CM (NEEDLE) IMPLANT
NEEDLE PERC 18GX7CM (NEEDLE) IMPLANT
NS IRRIG 1000ML POUR BTL (IV SOLUTION) ×2 IMPLANT
PACK CV ACCESS (CUSTOM PROCEDURE TRAY) ×2 IMPLANT
PACK ENDO MINOR (CUSTOM PROCEDURE TRAY) IMPLANT
PAD ARMBOARD 7.5X6 YLW CONV (MISCELLANEOUS) ×4 IMPLANT
SET MICROPUNCTURE 5F STIFF (MISCELLANEOUS) ×2 IMPLANT
SHEATH BRITE TIP 7FRX11 (SHEATH) ×1 IMPLANT
STOPCOCK 3 WAY HIGH PRESSURE (MISCELLANEOUS) ×2
STOPCOCK 3WAY HIGH PRESSURE (MISCELLANEOUS) IMPLANT
STOPCOCK MORSE 400PSI 3WAY (MISCELLANEOUS) ×2 IMPLANT
SUT PROLENE 5 0 C 1 24 (SUTURE) ×2 IMPLANT
SUT PROLENE 6 0 CC (SUTURE) ×2 IMPLANT
SUT VIC AB 3-0 SH 27 (SUTURE) ×4
SUT VIC AB 3-0 SH 27X BRD (SUTURE) ×1 IMPLANT
SUT VICRYL 4-0 PS2 18IN ABS (SUTURE) IMPLANT
SYR 10ML LL (SYRINGE) ×6 IMPLANT
SYR 20ML LL LF (SYRINGE) ×4 IMPLANT
SYR CONTROL 10ML LL (SYRINGE) ×2 IMPLANT
TOWEL GREEN STERILE (TOWEL DISPOSABLE) ×2 IMPLANT
TUBING CIL FLEX 10 FLL-RA (TUBING) ×3 IMPLANT
UNDERPAD 30X36 HEAVY ABSORB (UNDERPADS AND DIAPERS) ×2 IMPLANT
WATER STERILE IRR 1000ML POUR (IV SOLUTION) ×2 IMPLANT
WIRE BENTSON .035X145CM (WIRE) IMPLANT

## 2021-01-26 NOTE — Transfer of Care (Signed)
Immediate Anesthesia Transfer of Care Note  Patient: [name]  Procedure(s) Performed: REVISON OF ARTERIOVENOUS FISTULA RIGHT (Right Arm Upper) FISTULOGRAM RIGHT (Right Arm Upper) ANGIOPLASTY RIGHT SUBCLAVIAN VEIN (Right Arm Upper)  Patient Location: PACU  Anesthesia Type:General  Level of Consciousness: drowsy  Airway & Oxygen Therapy: Patient Spontanous Breathing and Patient connected to face mask oxygen  Post-op Assessment: Report given to RN and Post -op Vital signs reviewed and stable  Post vital signs: Reviewed and stable  Last Vitals:  Vitals Value Taken Time  BP 134/53 01/26/21 1148  Temp    Pulse 73 01/26/21 1148  Resp 21 01/26/21 1148  SpO2 100 % 01/26/21 1148  Vitals shown include unvalidated device data.  Last Pain:  Vitals:   01/26/21 0859  TempSrc:   PainSc: 5       Patients Stated Pain Goal: 3 (50/72/25 7505)  Complications: No complications documented.

## 2021-01-26 NOTE — Op Note (Signed)
    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  Through an elliptical incision, I excised the ulcerated area.  This incorporated the anterior wall of the vein which was closed primarily with a 5-0 Prolene.  Indications: The patient presented to the emergency department several days ago with an ulcer on his fistula.  He comes in today for repair  Procedure:  The patient was identified in the holding area and taken to [facility]  The patient was then placed supine on the table. general anesthesia was administered.  The patient was prepped and draped in the usual sterile fashion.  A time out was called and antibiotics were administered.  A PA was necessary to expedite the procedure and assist with technical details.  I began by making an elliptical incision around the ulcerated portion of the fistula.  Cautery was used to divide the subcutaneous tissue and get circumferential control of the fistula, proximal and distal to the ulcerated area.  Next the vein was cannulated with a micropuncture needle.  A 018 wire was inserted followed by micropuncture sheath.  A fistulogram was performed that showed a 80% stenosis at the right subclavian, right innominate vein junction.  Over a Bentson wire, I upsized to a 7 [place] sheath.  A 12 x 60 Mustang balloon was used to perform central venoplasty at 15 atm.  Follow-up venogram showed no residual  stenosis.  Next, the fistula was occluded with vascular clamps and the sheath was removed.  Using a 15 blade, I excised the ulcerated skin, which incorporated the anterior wall of the fistula.  I then closed the fistula primarily with a running 5-0 Prolene.  The clamps were released.  There was an excellent flow within the fistula.  Hemostasis was achieved.  Next, the subcutaneous tissue was closed with interrupted 3-0 Vicryl over top of the fistula and the skin was closed with a running 4-0 Monocryl.  Dermabond was applied.  A Ace bandage was used to wrap the arm.  There were no immediate complications.  The patient was successfully extubated and taken to recovery in stable condition.   Disposition: To PACU stable.   Theotis Burrow, M.D., [facility] [place] Office: [phone] Pager:  [phone]

## 2021-01-26 NOTE — Anesthesia Procedure Notes (Signed)
Procedure Name: LMA Insertion Date/Time: 01/26/2021 10:15 AM Performed by: Reece Agar, CRNA Pre-anesthesia Checklist: Patient identified, Emergency Drugs available, Suction available and Patient being monitored Patient Re-evaluated:Patient Re-evaluated prior to induction Oxygen Delivery Method: Circle System Utilized Preoxygenation: Pre-oxygenation with 100% oxygen Induction Type: IV induction Ventilation: Mask ventilation without difficulty LMA: LMA inserted LMA Size: 4.0 Number of attempts: 1 Airway Equipment and Method: Bite block Placement Confirmation: positive ETCO2 Tube secured with: Tape Dental Injury: Teeth and Oropharynx as per pre-operative assessment

## 2021-01-26 NOTE — Interval H&P Note (Signed)
History and Physical Interval Note:  01/26/2021 9:25 AM  [name]  has presented today for surgery, with the diagnosis of ESRD.  The various methods of treatment have been discussed with the patient and family. After consideration of risks, benefits and other options for treatment, the patient has consented to  Procedure(s): REVISON OF ARTERIOVENOUS FISTULA RIGHT (Right) FISTULOGRAM RIGHT (Right) as a surgical intervention.  The patient's history has been reviewed, patient examined, no change in status, stable for surgery.  I have reviewed the patient's chart and labs.  Questions were answered to the patient's satisfaction.     Annamarie Major

## 2021-01-26 NOTE — Anesthesia Postprocedure Evaluation (Signed)
Anesthesia Post Note  Patient: [name]  Procedure(s) Performed: REVISON OF ARTERIOVENOUS FISTULA RIGHT (Right Arm Upper) FISTULOGRAM RIGHT (Right Arm Upper) ANGIOPLASTY RIGHT SUBCLAVIAN VEIN (Right Arm Upper)     Anesthesia Post Evaluation No complications documented.  Last Vitals:  Vitals:   01/26/21 1218 01/26/21 1233  BP: (!) 125/52 137/71  Pulse: 76 67  Resp: 17 15  Temp:  36.6 C  SpO2: 100% 99%    Last Pain:  Vitals:   01/26/21 1233  TempSrc:   PainSc: 3                  Gianpaolo Mindel

## 2021-01-27 ENCOUNTER — Telehealth: Payer: Self-pay | Admitting: Internal Medicine

## 2021-01-27 ENCOUNTER — Encounter (HOSPITAL_COMMUNITY): Payer: Self-pay | Admitting: Surgery

## 2021-01-27 DIAGNOSIS — N2581 Secondary hyperparathyroidism of renal origin: Secondary | ICD-10-CM | POA: Diagnosis not present

## 2021-01-27 DIAGNOSIS — N186 End stage renal disease: Secondary | ICD-10-CM | POA: Diagnosis not present

## 2021-01-27 DIAGNOSIS — E785 Hyperlipidemia, unspecified: Secondary | ICD-10-CM | POA: Diagnosis not present

## 2021-01-27 DIAGNOSIS — D509 Iron deficiency anemia, unspecified: Secondary | ICD-10-CM | POA: Diagnosis not present

## 2021-01-27 DIAGNOSIS — Z992 Dependence on renal dialysis: Secondary | ICD-10-CM | POA: Diagnosis not present

## 2021-01-27 NOTE — Telephone Encounter (Signed)
Faxed signed orders to [facility] / Latricia Heft [phone], phone [phone]   Order # [phone]

## 2021-01-31 DIAGNOSIS — T82898A Other specified complication of vascular prosthetic devices, implants and grafts, initial encounter: Secondary | ICD-10-CM | POA: Diagnosis not present

## 2021-01-31 DIAGNOSIS — N186 End stage renal disease: Secondary | ICD-10-CM | POA: Diagnosis not present

## 2021-01-31 DIAGNOSIS — Z992 Dependence on renal dialysis: Secondary | ICD-10-CM | POA: Diagnosis not present

## 2021-02-01 DIAGNOSIS — E785 Hyperlipidemia, unspecified: Secondary | ICD-10-CM | POA: Diagnosis not present

## 2021-02-01 DIAGNOSIS — N2581 Secondary hyperparathyroidism of renal origin: Secondary | ICD-10-CM | POA: Diagnosis not present

## 2021-02-01 DIAGNOSIS — N186 End stage renal disease: Secondary | ICD-10-CM | POA: Diagnosis not present

## 2021-02-01 DIAGNOSIS — Z992 Dependence on renal dialysis: Secondary | ICD-10-CM | POA: Diagnosis not present

## 2021-02-01 DIAGNOSIS — D509 Iron deficiency anemia, unspecified: Secondary | ICD-10-CM | POA: Diagnosis not present

## 2021-02-02 ENCOUNTER — Other Ambulatory Visit: Payer: Self-pay

## 2021-02-02 ENCOUNTER — Other Ambulatory Visit (INDEPENDENT_AMBULATORY_CARE_PROVIDER_SITE_OTHER): Payer: Medicare Other

## 2021-02-02 ENCOUNTER — Other Ambulatory Visit: Payer: Self-pay | Admitting: *Deleted

## 2021-02-02 DIAGNOSIS — I120 Hypertensive chronic kidney disease with stage 5 chronic kidney disease or end stage renal disease: Secondary | ICD-10-CM | POA: Diagnosis not present

## 2021-02-02 DIAGNOSIS — D631 Anemia in chronic kidney disease: Secondary | ICD-10-CM | POA: Diagnosis not present

## 2021-02-02 DIAGNOSIS — E1165 Type 2 diabetes mellitus with hyperglycemia: Secondary | ICD-10-CM

## 2021-02-02 DIAGNOSIS — F0390 Unspecified dementia without behavioral disturbance: Secondary | ICD-10-CM | POA: Diagnosis not present

## 2021-02-02 DIAGNOSIS — N186 End stage renal disease: Secondary | ICD-10-CM | POA: Diagnosis not present

## 2021-02-02 DIAGNOSIS — E1122 Type 2 diabetes mellitus with diabetic chronic kidney disease: Secondary | ICD-10-CM | POA: Diagnosis not present

## 2021-02-02 DIAGNOSIS — S72144D Nondisplaced intertrochanteric fracture of right femur, subsequent encounter for closed fracture with routine healing: Secondary | ICD-10-CM | POA: Diagnosis not present

## 2021-02-02 LAB — HEMOGLOBIN A1C: Hgb A1c MFr Bld: 4.3 % — ABNORMAL LOW (ref 4.6–6.5)

## 2021-02-02 LAB — CALCIUM: Calcium: 9.4 mg/dL (ref 8.4–10.5)

## 2021-02-02 LAB — ALBUMIN: Albumin: 2.8 g/dL — ABNORMAL LOW (ref 3.5–5.2)

## 2021-02-02 LAB — GLUCOSE, RANDOM: Glucose, Bld: 72 mg/dL (ref 70–99)

## 2021-02-02 NOTE — Patient Outreach (Signed)
[facility]) Care Management  [place]  02/02/2021   [name] 18-Oct-1940 [id number]  Subjective: Telephone outreach for DM, ESRD  Encounter Medications:  Outpatient Encounter Medications as of 02/02/2021  Medication Sig  . acetaminophen (TYLENOL) 325 MG tablet Take 1-2 tablets (325-650 mg total) by mouth every 4 (four) hours as needed for mild pain.  Marland Kitchen albuterol (VENTOLIN HFA) 108 (90 Base) MCG/ACT inhaler Inhale 1 puff into the lungs daily as needed for wheezing or shortness of breath.  . allopurinol (ZYLOPRIM) 100 MG tablet Take 100 mg by mouth daily.  . Blood Glucose Monitoring Suppl (FREESTYLE FREEDOM LITE) w/Device KIT Use to check blood sugar 2 times per day dx code E11.65  . camphor-menthol (SARNA) lotion Apply 1 application topically 4 (four) times daily as needed for itching.   . Carboxymethylcellulose Sodium (THERATEARS) 0.25 % SOLN Place 1 drop into both eyes as needed (for dryness).  . cetirizine (ZYRTEC) 10 MG chewable tablet Chew 10 mg by mouth every Monday, Wednesday, and Friday.  . clobetasol (TEMOVATE) 0.05 % external solution Apply 1 application topically 2 (two) times daily as needed (scalp irritation.).   Marland Kitchen denosumab (PROLIA) 60 MG/ML SOSY injection Inject 60 mg into the skin every 6 (six) months.  . donepezil (ARICEPT) 10 MG tablet Take 1 tablet (10 mg total) by mouth at bedtime.  Marland Kitchen FLUOCINOLONE ACETONIDE SCALP 0.01 % OIL Apply 1 application topically daily as needed (scalp irritation.).   Marland Kitchen fluticasone (FLONASE) 50 MCG/ACT nasal spray Place 1 spray into both nostrils daily.  Marland Kitchen glucose blood (FREESTYLE LITE) test strip USE AS DIRECTED THREE TIMES DAILY  . HYDROcodone-acetaminophen (NORCO/VICODIN) 5-325 MG tablet Take 1 tablet by mouth every 4 (four) hours as needed for moderate pain.  . hydrocortisone 2.5 % cream Apply 1 application topically 3 (three) times daily as needed (skin irritation (legs & arms)).   Marland Kitchen insulin aspart (NOVOLOG  FLEXPEN) 100 UNIT/ML FlexPen INJECT 6-8 UNITS UNDER THE SKIN THREE TIMES DAILY BEFORE MEALS. DX:E11.65 (Patient taking differently: Inject 6-8 Units into the skin 3 (three) times daily with meals. INJECT 6-8 UNITS UNDER THE SKIN THREE TIMES DAILY BEFORE MEALS. DX:E11.65)  . Insulin Pen Needle (PEN NEEDLES) 32G X 4 MM MISC 1 each by Does not apply route 3 (three) times daily. USE PEN NEEDLES TO INJECT INSULIN THREE TIMES DAILY.  Marland Kitchen ketoconazole (NIZORAL) 2 % shampoo Apply 1 application topically 2 (two) times a week.  . Lancets (FREESTYLE) lancets Use as instructed to check blood sugar 2 times per day dx code E11.65  . levothyroxine (SYNTHROID, LEVOTHROID) 50 MCG tablet Take 1 tablet (50 mcg total) by mouth daily. (Patient taking differently: Take 50 mcg by mouth daily before breakfast.)  . lidocaine (LIDODERM) 5 % Place 1 patch onto the skin daily as needed (back pain).   . memantine (NAMENDA) 10 MG tablet Take 1 tablet (10 mg total) by mouth 2 (two) times daily.  . midodrine (PROAMATINE) 10 MG tablet Take one pill prior to hemodialysis on MWF (Patient taking differently: Take 5 mg by mouth every Monday, Wednesday, and Friday. Take one pill prior to hemodialysis on MWF)  . Nutritional Supplements (FEEDING SUPPLEMENT, NEPRO CARB STEADY,) LIQD Take 237 mLs by mouth in the morning and at bedtime.   Marland Kitchen omeprazole (PRILOSEC) 40 MG capsule Take 1 capsule (40 mg total) by mouth daily.  . sevelamer carbonate (RENVELA) 800 MG tablet Take 1,600-2,400 mg by mouth See admin instructions. Take 2,400 mg by mouth  three times a day with meals and 1,600 mg with snacks on Sun/Tues/Thurs/Sat and 2,400 mg with breakfast and dinner and nothing with snacks on Mon/Wed/Fri  . simvastatin (ZOCOR) 20 MG tablet Take 20 mg by mouth at bedtime.  . TRADJENTA 5 MG TABS tablet TAKE 1 TABLET DAILY (Patient taking differently: Take 5 mg by mouth daily.)   No facility-administered encounter medications on file as of 02/02/2021.     Functional Status:  In your present state of health, do you have any difficulty performing the following activities: 01/26/2021 01/05/2021  Hearing? N -  Vision? N N  Difficulty concentrating or making decisions? [name]  Walking or climbing stairs? Y Y  Dressing or bathing? Y Y  Doing errands, shopping? - Y  Preparing Food and eating ? - Y  Using the Toilet? - Y  In the past six months, have you accidently leaked urine? - Y  Do you have problems with loss of bowel control? - Y  Managing your Medications? - Y  Managing your Finances? - Y  Housekeeping or managing your Housekeeping? - Y  Some recent data might be hidden    Fall/Depression Screening: Fall Risk  01/05/2021 01/03/2021 01/20/2019  Falls in the past year? _0 Comment - - -  Number falls in past yr: 1 0 0  Injury with Fall? 1 1 0  Risk for fall due to : History of fall(s);Impaired balance/gait;Impaired mobility;Medication side effect;Mental status change;Orthopedic patient - -  Follow up Falls evaluation completed;Education provided;Falls prevention discussed Falls evaluation completed Falls evaluation completed;Education provided   [facility] 2/9 Scores 01/05/2021 01/20/2019 10/09/2018 08/26/2018 08/29/2017 02/14/2015 02/14/2015  PHQ - 2 Score 6 0 2 2 0 0 0  PHQ- 9 Score 18 - - 14 - - -    Assessment: ESRD                         DM                        Dementia                        Chronic pain                        General health decline approaching end of life ( will have palliative                                  care consult 02/21/21)  Goals Addressed            This Visit's Progress   . Cope with Pain as evidenced by improved ability to move around by wife's report over the next 3 months.   On track    Timeframe:  Long-Range Goal Priority:  High Start Date:      01/05/21                       Expected End Date:     04/21/21    Barriers: Knowledge                Follow Up Date 02/20/21   Notes: 01/05/21 Wife  reports pt back pain keeps him from doing everything in his PT sessions. He takes 1000 mg APAP am and PM. Recommended increasing  to tid max dose. Also use topical diclofenac on a regular basis. Exercise on days that he does not have PT. 01/19/21 Mr. Grimaldo had an ER visit for SOB, hypglycemia, found new dx of AFIB. No PE. Started Eliquis. Applied a heart monitor to be worn for 2 weeks. Pt is still getting PT and his mobiilty is slowly improving. He takes APAP am and pm and this helps him get up and get moving. 02/02/21 Pt is coping with pain usually with APAP. He did have revision of his fistula and that is not causing him too much discomfort. They are using an alternate temporary subclavian access until the fistula is ready to use again. He is moving around but slowly. She encouraged safety and he has not had any recent falls.    . COMPLETED: Improve My Nutrition with the Foods I Eat and Drink as evidenced by minimal wt loss over the next 30 days.       Timeframe:  Short range Priority:  Medium Start Date:   01/05/21                          Expected End Date:    02/05/21           Follow Up Date 02/05/21   - choose small, frequent meals - drink protein shakes (sipping is ok) - fill half of plate with vegetables    Why is this important?    Eating 3 healthy meals, or 5 or 6 small meals, a day is very important.   A healthy diet is one that has fruit, vegetables, protein, good fats, whole grains and low-fat dairy products.   Be sure to have plenty of protein that builds strong muscles. Many older people do not get quite enough protein.     Notes: 01/05/21 Pt is ESRD on dialysis, protein must be limited but needs to have max allowed 60 Gms per day. Has had dietary counseling. Current wt per wife is 158. 01/19/21 Pt wt today is 147 an 11# wt loss in 2 weeks. WIfe reports poor appetite, poor intake. Discussed appropriate snack for hypoglycemia. Suggested 1/2 PBJ sandwich with glass of milk. Encourage food  often, small amounts. 02/02/21 Wife reports today's weight was 150# but she feels like he eats less and less, just not interested at all. I does appear like he did gain 3# so will will call this goal met today. Educated about advancing dementia and how loss of interest and hunger do dwindle as part of the disease process. Recognized wife's frustration and saddness and all her efforts to encourage him to eat.     . Monitor and Manage My Blood Sugar-Diabetes Type 2 by wife's report of FBS range over the next 3 months.       Timeframe:  Long-Range Goal Priority:  Medium Start Date:    01/05/21                         Expected End Date:   04/21/29                 Follow Up Date 03/08/21   - check blood sugar at prescribed times - take the blood sugar meter to all doctor visits    Why is this important?    Checking your blood sugar at home helps to keep it from getting very high or very low.   Writing the results in  a diary or log helps the doctor know how to care for you.   Your blood sugar log should have the time, date and the results.   Also, write down the amount of insulin or other medicine that you take.   Other information, like what you ate, exercise done and how you were feeling, will also be helpful.     Notes: 01/05/21 Wife reports FBS today was 89. Usually between 70-90. Last HgbA1C was 5.3. He is on trajenta and lantus. Wife to make endocrinology appt to discuss discontinuation of one of these. 01/19/21 Pt has ED visit with hypoglycemia, SOB, new AFIB. Glucose was 51 first thing in the am. He is getting units regular insulin with regular glucose readings before bed. Today's FBS was 72, before supper reading was 124. Requested that wife try no hs insulin at hs and see how FBS are. Report this experiment to his endocrinologist scheduled for next week. Pt condition is definitely dwindling. Have introduced the idea of Palliative Care. 02/02/21 Pt glucose readings are allnormal without insulin  and his FBS are running 60-79, high over last week was 105. Advised wife she can stop the frequent glucose checks except for his fasting, since he is not having any highs and we'd rather him be a bit high than too low. Advised that I will message Dr. Dwyane Dee of my given advice.    . Obtain Eye Exam-Diabetes Type 2 within the next 3 months per report.       Follow Up Date 03/08/21  - schedule appointment with eye doctor    Why is this important?    Eye check-ups are important when you have diabetes.   Vision loss can be prevented.    Notes: 01/05/21 Had an appt scheduled but then had fall and fx. Wife to reschedule. 4/28./22 Too much going on to think about eye exam. Will play it by ear. Pt condition in dwindling. 02/02/21 Advised that wife does not need to burden herself with these appts since her husband is edging towards the end of life. She says she does want to follow through at make an eye and foot appt and dentist appt. She plans on making these for June.       Plan: Will advise primary care and endocrinologist that NP has suggested only checking FBS, take trajenta, no need for sliding scale any longer. This will simplify their already complicated daily routine.  Follow-up:  Patient agrees to Care Plan and Follow-up. We will talk again on June 10th.  Eulah Pont. Myrtie Neither, MSN, [facility] Gerontological Nurse Practitioner [facility] Care Management [phone]

## 2021-02-03 DIAGNOSIS — S72144D Nondisplaced intertrochanteric fracture of right femur, subsequent encounter for closed fracture with routine healing: Secondary | ICD-10-CM | POA: Diagnosis not present

## 2021-02-03 DIAGNOSIS — N2581 Secondary hyperparathyroidism of renal origin: Secondary | ICD-10-CM | POA: Diagnosis not present

## 2021-02-03 DIAGNOSIS — R002 Palpitations: Secondary | ICD-10-CM | POA: Diagnosis not present

## 2021-02-03 DIAGNOSIS — E1122 Type 2 diabetes mellitus with diabetic chronic kidney disease: Secondary | ICD-10-CM | POA: Diagnosis not present

## 2021-02-03 DIAGNOSIS — I120 Hypertensive chronic kidney disease with stage 5 chronic kidney disease or end stage renal disease: Secondary | ICD-10-CM | POA: Diagnosis not present

## 2021-02-03 DIAGNOSIS — D509 Iron deficiency anemia, unspecified: Secondary | ICD-10-CM | POA: Diagnosis not present

## 2021-02-03 DIAGNOSIS — E785 Hyperlipidemia, unspecified: Secondary | ICD-10-CM | POA: Diagnosis not present

## 2021-02-03 DIAGNOSIS — N186 End stage renal disease: Secondary | ICD-10-CM | POA: Diagnosis not present

## 2021-02-03 DIAGNOSIS — D631 Anemia in chronic kidney disease: Secondary | ICD-10-CM | POA: Diagnosis not present

## 2021-02-03 DIAGNOSIS — F0390 Unspecified dementia without behavioral disturbance: Secondary | ICD-10-CM | POA: Diagnosis not present

## 2021-02-03 DIAGNOSIS — Z992 Dependence on renal dialysis: Secondary | ICD-10-CM | POA: Diagnosis not present

## 2021-02-06 DIAGNOSIS — N186 End stage renal disease: Secondary | ICD-10-CM | POA: Diagnosis not present

## 2021-02-06 DIAGNOSIS — D509 Iron deficiency anemia, unspecified: Secondary | ICD-10-CM | POA: Diagnosis not present

## 2021-02-06 DIAGNOSIS — E785 Hyperlipidemia, unspecified: Secondary | ICD-10-CM | POA: Diagnosis not present

## 2021-02-06 DIAGNOSIS — Z992 Dependence on renal dialysis: Secondary | ICD-10-CM | POA: Diagnosis not present

## 2021-02-06 DIAGNOSIS — N2581 Secondary hyperparathyroidism of renal origin: Secondary | ICD-10-CM | POA: Diagnosis not present

## 2021-02-06 LAB — FRUCTOSAMINE: Fructosamine: 277 umol/L (ref 205–285)

## 2021-02-06 NOTE — Addendum Note (Signed)
Encounter addended by: Juluis Mire, RN on: 02/06/2021 9:41 AM  Actions taken: Imaging Exam ended

## 2021-02-07 ENCOUNTER — Encounter: Payer: Self-pay | Admitting: Endocrinology

## 2021-02-07 ENCOUNTER — Ambulatory Visit (INDEPENDENT_AMBULATORY_CARE_PROVIDER_SITE_OTHER): Payer: Medicare Other | Admitting: Endocrinology

## 2021-02-07 ENCOUNTER — Other Ambulatory Visit: Payer: Self-pay

## 2021-02-07 VITALS — BP 138/68 | HR 61 | Ht 71.0 in | Wt 147.4 lb

## 2021-02-07 DIAGNOSIS — S72144D Nondisplaced intertrochanteric fracture of right femur, subsequent encounter for closed fracture with routine healing: Secondary | ICD-10-CM | POA: Diagnosis not present

## 2021-02-07 DIAGNOSIS — M8000XD Age-related osteoporosis with current pathological fracture, unspecified site, subsequent encounter for fracture with routine healing: Secondary | ICD-10-CM

## 2021-02-07 DIAGNOSIS — Z794 Long term (current) use of insulin: Secondary | ICD-10-CM | POA: Diagnosis not present

## 2021-02-07 DIAGNOSIS — E1122 Type 2 diabetes mellitus with diabetic chronic kidney disease: Secondary | ICD-10-CM | POA: Diagnosis not present

## 2021-02-07 DIAGNOSIS — D631 Anemia in chronic kidney disease: Secondary | ICD-10-CM | POA: Diagnosis not present

## 2021-02-07 DIAGNOSIS — I120 Hypertensive chronic kidney disease with stage 5 chronic kidney disease or end stage renal disease: Secondary | ICD-10-CM | POA: Diagnosis not present

## 2021-02-07 DIAGNOSIS — N186 End stage renal disease: Secondary | ICD-10-CM | POA: Diagnosis not present

## 2021-02-07 DIAGNOSIS — E1165 Type 2 diabetes mellitus with hyperglycemia: Secondary | ICD-10-CM | POA: Diagnosis not present

## 2021-02-07 DIAGNOSIS — F0390 Unspecified dementia without behavioral disturbance: Secondary | ICD-10-CM | POA: Diagnosis not present

## 2021-02-07 NOTE — Progress Notes (Signed)
Patient ID: [name], male   DOB: Jan 26, 1941, 80 y.o.   MRN: [id number]           Reason for Appointment:  Follow-up for endocrinology problems   History of Present Illness:          Date of diagnosis of type 2 diabetes mellitus:  1996      Background history:  He has had long-standing diabetes probably treated with metformin initially and subsequently with sulfonylurea drugs At some point he was also given Actos in addition to his glipizide which he is still taking Records of his control are only available for the last 4 years not on Over the last few years his A1c has been generally in the 6-7% range His A1c was 6.2 in May 2016 and apparently was starting to get low normal blood sugars at times He was admitted to the hospital for fluid overload and renal failure and at that time his Actos was stopped Subsequently his blood sugars had been markedly increased He was switched from regular insulin to NovoLog on his initial consultation  Recent history:   INSULIN regimen:   Previously on NovoLog 6 units with breakfast and usually 6 units before dinner  Oral hypoglycemic drugs: Previously on Tradjenta 5 mg daily  His A1c tends to be falsely low, now 4.3 only  Fructosamine in relatively better at 277   Current blood sugar patterns and problems identified:  His appetite has been decreased significantly since after his hip fracture earlier this year  He is losing weight again in the last few weeks although recently stable  About 10 days ago he started getting low blood sugars with symptoms of drowsiness and blood sugar reading about 20 on his meter  Since then his insulin has been stopped and his wife is also holding off his Tradjenta  Surprisingly since then his blood sugars have been mostly  Normal with highest reading only 154 late afternoon  Again not able to do much physical activity because of weakness   Dialysis is done at 12 pm, doing it on  Monday/Wednesday/Fridays     Side effects from medications have been: None  Compliance with the medical regimen: Fair  Glucose monitoring:  done up to 3  times a day         Glucometer:  Freestyle  Blood Glucose readings by review of monitor Download and averages:   PRE-MEAL Fasting Lunch Dinner Bedtime Overall  Glucose range:  62-94  78-110  20-154    Mean/median:     86   POST-MEAL PC Breakfast PC Lunch PC Dinner  Glucose range:    80-124  Mean/median:      Previously:  PRE-MEAL Fasting Lunch Dinner Bedtime Overall  Glucose range:  79-94   74-198    Mean/median: ?  90  ?  120   117   POST-MEAL PC Breakfast PC Lunch PC Dinner  Glucose range:  115-184   137-198  Mean/median:          Self-care: The diet that the patient has been following is: tries to limit sweets, portions. eating out only about once a week at a steak house        Typical meal intake: Breakfast is variable, ham biscuit; supper 7-8 pm            Dietician visit, most recent: 08/2017   Weight history:   Wt Readings from Last 3 Encounters:  02/07/21 147 lb 6.4 oz (66.9 kg)  01/26/21 147 lb 0.8 oz (66.7 kg)  01/19/21 147 lb (66.7 kg)     Glycemic control:    Lab Results  Component Value Date   HGBA1C 4.3 (L) 02/02/2021   HGBA1C 4.9 11/08/2020   HGBA1C 5.3 03/31/2020   Lab Results  Component Value Date   MICROALBUR 37.2 09/03/2016   LDLCALC 22 03/29/2020   CREATININE 4.90 (H) 01/26/2021    Lab Results  Component Value Date   FRUCTOSAMINE 277 02/02/2021   FRUCTOSAMINE 295 (H) 08/02/2020   FRUCTOSAMINE 322 (H) 03/29/2020    PROBLEM 2 Osteoporosis: See review of systems     Allergies as of 02/07/2021      Reactions   Ambien [zolpidem Tartrate] Other (See Comments)   Hallucinations and "felt crazy"    Penicillins Rash, Hives   Has patient had a PCN reaction causing immediate rash, facial/tongue/throat swelling, SOB or lightheadedness with hypotension: Yes Has patient had a PCN  reaction causing severe rash involving mucus membranes or skin necrosis: Yes Has patient had a PCN reaction that required hospitalization: No Has patient had a PCN reaction occurring within the last 10 years: No If all of the above answers are "NO", then may proceed with Cephalosporin use. Other reaction(s): HIVES      Medication List       Accurate as of Feb 07, 2021  3:34 PM. If you have any questions, ask your nurse or doctor.        acetaminophen 325 MG tablet Commonly known as: TYLENOL Take 1-2 tablets (325-650 mg total) by mouth every 4 (four) hours as needed for mild pain.   albuterol 108 (90 Base) MCG/ACT inhaler Commonly known as: VENTOLIN HFA Inhale 1 puff into the lungs daily as needed for wheezing or shortness of breath.   allopurinol 100 MG tablet Commonly known as: ZYLOPRIM Take 100 mg by mouth daily.   camphor-menthol lotion Commonly known as: SARNA Apply 1 application topically 4 (four) times daily as needed for itching.   cetirizine 10 MG chewable tablet Commonly known as: ZYRTEC Chew 10 mg by mouth every Monday, Wednesday, and Friday.   clobetasol 0.05 % external solution Commonly known as: TEMOVATE Apply 1 application topically 2 (two) times daily as needed (scalp irritation.).   denosumab 60 MG/ML Sosy injection Commonly known as: PROLIA Inject 60 mg into the skin every 6 (six) months.   donepezil 10 MG tablet Commonly known as: ARICEPT Take 1 tablet (10 mg total) by mouth at bedtime.   feeding supplement (NEPRO CARB STEADY) Liqd Take 237 mLs by mouth in the morning and at bedtime.   Fluocinolone Acetonide Scalp 0.01 % Oil Apply 1 application topically daily as needed (scalp irritation.).   fluticasone 50 MCG/ACT nasal spray Commonly known as: FLONASE Place 1 spray into both nostrils daily.   FreeStyle Freedom Lite w/Device Kit Use to check blood sugar 2 times per day dx code E11.65   freestyle lancets Use as instructed to check blood  sugar 2 times per day dx code E11.65   FREESTYLE LITE test strip Generic drug: glucose blood USE AS DIRECTED THREE TIMES DAILY   HYDROcodone-acetaminophen 5-325 MG tablet Commonly known as: NORCO/VICODIN Take 1 tablet by mouth every 4 (four) hours as needed for moderate pain.   hydrocortisone 2.5 % cream Apply 1 application topically 3 (three) times daily as needed (skin irritation (legs & arms)).   insulin aspart 100 UNIT/ML FlexPen Commonly known as: NovoLOG FlexPen INJECT 6-8 UNITS UNDER THE SKIN THREE TIMES  DAILY BEFORE MEALS. DX:E11.65 What changed:   how much to take  how to take this  when to take this   ketoconazole 2 % shampoo Commonly known as: Nizoral Apply 1 application topically 2 (two) times a week.   levothyroxine 50 MCG tablet Commonly known as: SYNTHROID Take 1 tablet (50 mcg total) by mouth daily. What changed: when to take this   lidocaine 5 % Commonly known as: LIDODERM Place 1 patch onto the skin daily as needed (back pain).   memantine 10 MG tablet Commonly known as: NAMENDA Take 1 tablet (10 mg total) by mouth 2 (two) times daily.   midodrine 10 MG tablet Commonly known as: PROAMATINE Take one pill prior to hemodialysis on MWF What changed:   how much to take  how to take this  when to take this   omeprazole 40 MG capsule Commonly known as: PRILOSEC Take 1 capsule (40 mg total) by mouth daily.   Pen Needles 32G X 4 MM Misc 1 each by Does not apply route 3 (three) times daily. USE PEN NEEDLES TO INJECT INSULIN THREE TIMES DAILY.   sevelamer carbonate 800 MG tablet Commonly known as: RENVELA Take 1,600-2,400 mg by mouth See admin instructions. Take 2,400 mg by mouth three times a day with meals and 1,600 mg with snacks on Sun/Tues/Thurs/Sat and 2,400 mg with breakfast and dinner and nothing with snacks on Mon/Wed/Fri   simvastatin 20 MG tablet Commonly known as: ZOCOR Take 20 mg by mouth at bedtime.   Theratears 0.25 %  Soln Generic drug: Carboxymethylcellulose Sodium Place 1 drop into both eyes as needed (for dryness).   Tradjenta 5 MG Tabs tablet Generic drug: linagliptin TAKE 1 TABLET DAILY What changed: how much to take       Allergies:  Allergies  Allergen Reactions  . Ambien [Zolpidem Tartrate] Other (See Comments)    Hallucinations and "felt crazy"   . Penicillins Rash and Hives    Has patient had a PCN reaction causing immediate rash, facial/tongue/throat swelling, SOB or lightheadedness with hypotension: Yes Has patient had a PCN reaction causing severe rash involving mucus membranes or skin necrosis: Yes Has patient had a PCN reaction that required hospitalization: No Has patient had a PCN reaction occurring within the last 10 years: No If all of the above answers are "NO", then may proceed with Cephalosporin use.  Other reaction(s): HIVES    Past Medical History:  Diagnosis Date  . Allergy   . Alzheimer's disease ([place]) 01/19/2021  . Anemia   . Arthritis   . Cataract    bil cateracts removed  . Coronary artery disease   . Dementia arising in the senium and presenium ([place])   . Diabetes mellitus    Type 2  . Diverticulitis   . ED (erectile dysfunction)   . Elevated homocysteine   . ESRD (end stage renal disease) on dialysis ([place]) 03/2015   M-W-F dialysis  . GERD (gastroesophageal reflux disease)    pepto   . Gout   . Headache, unspecified 02/25/2015  . Hiatal hernia   . Hyperlipidemia   . Hypertension   . Hypothyroidism   . Pneumonia   . PVD (peripheral vascular disease) ([place])    has plastic aorta  . Renal insufficiency   . Seasonal allergies   . Sleep apnea    does not wear c-pap    Past Surgical History:  Procedure Laterality Date  . ANGIOPLASTY Right 01/26/2021   Procedure: ANGIOPLASTY RIGHT SUBCLAVIAN VEIN;  Surgeon: Serafina Mitchell, MD;  [facility] OR;  Service: Vascular;  Laterality: Right;  . aortobifemoral bypass    . AV FISTULA PLACEMENT Left  12/01/2013   Procedure: ARTERIOVENOUS (AV) FISTULA CREATION- LEFT BRACHIOCEPHALIC;  Surgeon: Angelia Mould, MD;  Location: [place];  Service: Vascular;  Laterality: Left;  . [place] TRANSPOSITION Right 07/27/2014   Procedure: BASCILIC VEIN TRANSPOSITION;  Surgeon: Angelia Mould, MD;  Location: [place];  Service: Vascular;  Laterality: Right;  . BRAIN SURGERY  07/22/2018  . BREAST SURGERY     left - granulomatous mastitis  . BURR HOLE Bilateral 07/22/2018   Procedure: BILATERAL BURR HOLES;  Surgeon: Kristeen Miss, MD;  Location: [place];  Service: Neurosurgery;  Laterality: Bilateral;  . COLONOSCOPY    . 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 ENTEROSCOPY N/A 06/03/2018   Procedure: ENTEROSCOPY;  Surgeon: Lavena Bullion, DO;  Location: [place];  Service: Gastroenterology;  Laterality: N/A;  . ENTEROSCOPY N/A 11/20/2018   Procedure: ENTEROSCOPY;  Surgeon: Rush Landmark Telford Nab., MD;  Location: [facility] ENDOSCOPY;  Service: Gastroenterology;  Laterality: N/A;  . ENTEROSCOPY N/A 12/03/2019   Procedure: ENTEROSCOPY;  Surgeon: Lavena Bullion, DO;  Location: [facility] ENDOSCOPY;  Service: Gastroenterology;  Laterality: N/A;  push enteroscopy  . EYE SURGERY Bilateral    cataracts  . FISTULOGRAM Right 01/26/2021   Procedure: FISTULOGRAM RIGHT;  Surgeon: Serafina Mitchell, MD;  Location: [facility] OR;  Service: Vascular;  Laterality: Right;  . HEMODIALYSIS INPATIENT  01/17/2018      . HOT HEMOSTASIS N/A 06/03/2018   Procedure: HOT HEMOSTASIS (ARGON PLASMA COAGULATION/BICAP);  Surgeon: Lavena Bullion, DO;  Location: [facility] ENDOSCOPY;  Service: Gastroenterology;  Laterality: N/A;  . HOT HEMOSTASIS N/A 11/20/2018   Procedure: HOT HEMOSTASIS (ARGON PLASMA COAGULATION/BICAP);  Surgeon: Irving Copas., MD;  Location: [place];  Service: Gastroenterology;  Laterality: N/A;  . HOT HEMOSTASIS N/A 12/03/2019   Procedure: HOT HEMOSTASIS (ARGON PLASMA COAGULATION/BICAP);  Surgeon:  Lavena Bullion, DO;  Location: [facility] ENDOSCOPY;  Service: Gastroenterology;  Laterality: N/A;  . INTRAMEDULLARY (IM) NAIL INTERTROCHANTERIC Right 11/08/2020   Procedure: INTRAMEDULLARY (IM) NAIL INTERTROCHANTRIC;  Surgeon: Erle Crocker, MD;  Location: [place];  Service: Orthopedics;  Laterality: Right;  . REVISION OF ARTERIOVENOUS GORETEX GRAFT Right 09/01/2019   Procedure: REVISION OF ARTERIOVENOUS FISTULA RIGHT ARM;  Surgeon: Serafina Mitchell, MD;  Location: [facility] OR;  Service: Vascular;  Laterality: Right;  . REVISON OF ARTERIOVENOUS FISTULA Left 02/09/2014   Procedure: REVISON OF LEFT ARTERIOVENOUS FISTULA - RESECTION OF RENDUNDANT VEIN;  Surgeon: Angelia Mould, MD;  Location: [place];  Service: Vascular;  Laterality: Left;  . REVISON OF ARTERIOVENOUS FISTULA Right 01/26/2021   Procedure: REVISON OF ARTERIOVENOUS FISTULA RIGHT;  Surgeon: Serafina Mitchell, MD;  Location: [place];  Service: Vascular;  Laterality: Right;  . SBO with lysis adhesions    . SHUNTOGRAM Left 04/19/2014   Procedure: FISTULOGRAM;  Surgeon: Angelia Mould, MD;  Location: [facility] CATH LAB;  Service: Cardiovascular;  Laterality: Left;  . UNILATERAL UPPER EXTREMEITY ANGIOGRAM N/A 07/12/2014   Procedure: UNILATERAL UPPER Anselmo Rod;  Surgeon: Angelia Mould, MD;  Location: [facility] C[facility] LAB;  Service: Cardiovascular;  Laterality: N/A;    Family History  Problem Relation Age of Onset  . Aneurysm Mother        brain  . Heart disease Father   . Stroke Father   . Hypertension Father   .  Diabetes Father   . Dementia Neg Hx   . Colon cancer Neg Hx   . Esophageal cancer Neg Hx   . Pancreatic cancer Neg Hx   . Prostate cancer Neg Hx   . Rectal cancer Neg Hx   . Stomach cancer Neg Hx     Social History:  reports that he quit smoking about 26 years ago. His smoking use included cigarettes. He has never used smokeless tobacco. He reports that he does not drink alcohol and does not use drugs.    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 he was not given Forteo  He had been on Prolia since 5/19 and last injection was in 05/19/20  Calcium has been variable, also followed by nephrologist After his last Prolia injection it has been normal to low  After correction for albumin  his calcium is quite normal  PTH values not available   Lab Results  Component Value Date   PTH 154 (H) 10/06/2014   CALCIUM 9.4 02/02/2021   CAION 1.11 (L) 01/26/2021   PHOS 4.1 11/16/2020     Lipid history: He has been treated with Simvastatin 20 mg  by PCP, results as follows    Lab Results  Component Value Date   CHOL 81 03/29/2020   HDL 39.50 03/29/2020   [place] 22 03/29/2020   TRIG 101.0 03/29/2020   CHOLHDL 2 03/29/2020          He has ESRD on dialysis    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  Is on 50 mcg levothyroxine with normal TSH levels, this has been prescribed by PCP  Lab Results  Component Value Date   TSH 1.90 03/29/2020      Physical Examination:  BP 138/68   Pulse 61   Ht 5' 11" (1.803 m)   Wt 147 lb 6.4 oz (66.9 kg)   SpO2 99%   BMI 20.56 kg/m      ASSESSMENT:  Diabetes type 2  See history of present illness for discussion of his current management, blood sugar patterns and problems identified  Previously treated with mealtime NovoLog and Tradjenta These have been stopped because of episode of significant hypoglycemia earlier this month This is likely to be from his significantly decreased appetite of unclear etiology  His fructosamine is 277, A1c again falsely low Home blood sugars recently have been normal except for slightly higher reading of 154 late  afternoon even with no treatment   OSTEOPOROSIS with history of fracture: Likely cannot continue Prolia now because of tendency to   Manual to discuss use of Forteo with nephrologist but not clear what his PTH level has been  HYPERCALCEMIA: Resolved with corrected calcium 8.7, he can continue Tums for this   PLAN:   No treatment for diabetes  Osteoporosis and calcium issues: will consider Forteo unless he still has significant secondary hyperparathyroidism, will discuss with nephrologist including possibly adding Cinacalcet if needed   There are no Patient Instructions on file for this visit.     Elayne Snare 02/07/2021, 3:34 PM   Note: This office note was prepared with Dragon voice recognition system technology. Any transcriptional errors that  result from this process are unintentional.

## 2021-02-08 ENCOUNTER — Telehealth: Payer: Self-pay | Admitting: Endocrinology

## 2021-02-08 DIAGNOSIS — Z992 Dependence on renal dialysis: Secondary | ICD-10-CM | POA: Diagnosis not present

## 2021-02-08 DIAGNOSIS — N2581 Secondary hyperparathyroidism of renal origin: Secondary | ICD-10-CM | POA: Diagnosis not present

## 2021-02-08 DIAGNOSIS — E785 Hyperlipidemia, unspecified: Secondary | ICD-10-CM | POA: Diagnosis not present

## 2021-02-08 DIAGNOSIS — D509 Iron deficiency anemia, unspecified: Secondary | ICD-10-CM | POA: Diagnosis not present

## 2021-02-08 DIAGNOSIS — N186 End stage renal disease: Secondary | ICD-10-CM | POA: Diagnosis not present

## 2021-02-08 MED ORDER — FORTEO 600 MCG/2.4ML ~~LOC~~ SOPN: PEN_INJECTOR | SUBCUTANEOUS | 3 refills | Status: DC

## 2021-02-08 NOTE — Telephone Encounter (Signed)
Please tell patient and his wife that after discussion with his nephrologist he is going to start Forteo daily injections for his osteoporosis.  This will be a pen injector and we can show him how to use this if needed.  This may need PA done

## 2021-02-09 ENCOUNTER — Telehealth (HOSPITAL_COMMUNITY): Payer: Self-pay | Admitting: *Deleted

## 2021-02-09 DIAGNOSIS — F0390 Unspecified dementia without behavioral disturbance: Secondary | ICD-10-CM | POA: Diagnosis not present

## 2021-02-09 DIAGNOSIS — N186 End stage renal disease: Secondary | ICD-10-CM | POA: Diagnosis not present

## 2021-02-09 DIAGNOSIS — I120 Hypertensive chronic kidney disease with stage 5 chronic kidney disease or end stage renal disease: Secondary | ICD-10-CM | POA: Diagnosis not present

## 2021-02-09 DIAGNOSIS — D631 Anemia in chronic kidney disease: Secondary | ICD-10-CM | POA: Diagnosis not present

## 2021-02-09 DIAGNOSIS — E1122 Type 2 diabetes mellitus with diabetic chronic kidney disease: Secondary | ICD-10-CM | POA: Diagnosis not present

## 2021-02-09 DIAGNOSIS — S72144D Nondisplaced intertrochanteric fracture of right femur, subsequent encounter for closed fracture with routine healing: Secondary | ICD-10-CM | POA: Diagnosis not present

## 2021-02-09 NOTE — Telephone Encounter (Signed)
Pt wife notified of heart monitor results. No afib no anticoagulation indicated per Adline Peals PA

## 2021-02-09 NOTE — Telephone Encounter (Signed)
Please check on [email or web address] for patient assistance thanks

## 2021-02-09 NOTE — Telephone Encounter (Signed)
I spoke with his wife who said the cost of Forteo was $4100. She states it did come with a coupon but she's not sure how much it will take the price down to., I told her if it's not affordable to call back and let me know and we could try Patient assistance.  She agreed.

## 2021-02-10 DIAGNOSIS — Z992 Dependence on renal dialysis: Secondary | ICD-10-CM | POA: Diagnosis not present

## 2021-02-10 DIAGNOSIS — D509 Iron deficiency anemia, unspecified: Secondary | ICD-10-CM | POA: Diagnosis not present

## 2021-02-10 DIAGNOSIS — E785 Hyperlipidemia, unspecified: Secondary | ICD-10-CM | POA: Diagnosis not present

## 2021-02-10 DIAGNOSIS — N186 End stage renal disease: Secondary | ICD-10-CM | POA: Diagnosis not present

## 2021-02-10 DIAGNOSIS — N2581 Secondary hyperparathyroidism of renal origin: Secondary | ICD-10-CM | POA: Diagnosis not present

## 2021-02-10 NOTE — Telephone Encounter (Signed)
Patients wife called regarding Forteo, pricing, & Patient Assistance

## 2021-02-13 ENCOUNTER — Telehealth: Payer: Self-pay | Admitting: Internal Medicine

## 2021-02-13 DIAGNOSIS — N186 End stage renal disease: Secondary | ICD-10-CM | POA: Diagnosis not present

## 2021-02-13 DIAGNOSIS — N2581 Secondary hyperparathyroidism of renal origin: Secondary | ICD-10-CM | POA: Diagnosis not present

## 2021-02-13 DIAGNOSIS — E785 Hyperlipidemia, unspecified: Secondary | ICD-10-CM | POA: Diagnosis not present

## 2021-02-13 DIAGNOSIS — D509 Iron deficiency anemia, unspecified: Secondary | ICD-10-CM | POA: Diagnosis not present

## 2021-02-13 DIAGNOSIS — Z992 Dependence on renal dialysis: Secondary | ICD-10-CM | POA: Diagnosis not present

## 2021-02-13 NOTE — Telephone Encounter (Signed)
Please ask her to call the Nephrologist. He needs to be seen there right away to see if he has some issues related to kidney disease.

## 2021-02-13 NOTE — Telephone Encounter (Signed)
Called [name], she is going to call Kidney doctor and she has already called Gastrologist, but they could not see him till the end of June. She said if he got worst she would have him taken to ED by EMS.

## 2021-02-13 NOTE — Telephone Encounter (Signed)
Bed[facility] Winsor [phone]  [name] called to say she is concerned about [name], he is not eating and he has lost about 10 lbs in the last month. She stated he is eating only on Nepro, he says he stomach can not tolerate food and he does not go to bathroom very much. She would like to bring him in and talk with you about what to do.

## 2021-02-14 ENCOUNTER — Telehealth: Payer: Self-pay | Admitting: Internal Medicine

## 2021-02-14 DIAGNOSIS — D631 Anemia in chronic kidney disease: Secondary | ICD-10-CM | POA: Diagnosis not present

## 2021-02-14 DIAGNOSIS — S72144D Nondisplaced intertrochanteric fracture of right femur, subsequent encounter for closed fracture with routine healing: Secondary | ICD-10-CM | POA: Diagnosis not present

## 2021-02-14 DIAGNOSIS — N186 End stage renal disease: Secondary | ICD-10-CM | POA: Diagnosis not present

## 2021-02-14 DIAGNOSIS — I120 Hypertensive chronic kidney disease with stage 5 chronic kidney disease or end stage renal disease: Secondary | ICD-10-CM | POA: Diagnosis not present

## 2021-02-14 DIAGNOSIS — E1122 Type 2 diabetes mellitus with diabetic chronic kidney disease: Secondary | ICD-10-CM | POA: Diagnosis not present

## 2021-02-14 DIAGNOSIS — F0390 Unspecified dementia without behavioral disturbance: Secondary | ICD-10-CM | POA: Diagnosis not present

## 2021-02-14 NOTE — Telephone Encounter (Signed)
Faxed signed orders to [facility] [phone], phone [phone]  Order # [id number]

## 2021-02-14 NOTE — Telephone Encounter (Signed)
Please start the patient assistance form from [employer] for [employer]

## 2021-02-15 ENCOUNTER — Telehealth: Payer: Self-pay | Admitting: Endocrinology

## 2021-02-15 DIAGNOSIS — D509 Iron deficiency anemia, unspecified: Secondary | ICD-10-CM | POA: Diagnosis not present

## 2021-02-15 DIAGNOSIS — N2581 Secondary hyperparathyroidism of renal origin: Secondary | ICD-10-CM | POA: Diagnosis not present

## 2021-02-15 DIAGNOSIS — E785 Hyperlipidemia, unspecified: Secondary | ICD-10-CM | POA: Diagnosis not present

## 2021-02-15 DIAGNOSIS — N186 End stage renal disease: Secondary | ICD-10-CM | POA: Diagnosis not present

## 2021-02-15 DIAGNOSIS — Z992 Dependence on renal dialysis: Secondary | ICD-10-CM | POA: Diagnosis not present

## 2021-02-15 NOTE — Telephone Encounter (Signed)
[name]- [facility] is calling on behalf of patient. [facility] is needing a PA for  Forteo [id number]    [facility] [phone] - [place], [place]

## 2021-02-16 DIAGNOSIS — I120 Hypertensive chronic kidney disease with stage 5 chronic kidney disease or end stage renal disease: Secondary | ICD-10-CM | POA: Diagnosis not present

## 2021-02-16 DIAGNOSIS — S72144D Nondisplaced intertrochanteric fracture of right femur, subsequent encounter for closed fracture with routine healing: Secondary | ICD-10-CM | POA: Diagnosis not present

## 2021-02-16 DIAGNOSIS — E1122 Type 2 diabetes mellitus with diabetic chronic kidney disease: Secondary | ICD-10-CM | POA: Diagnosis not present

## 2021-02-16 DIAGNOSIS — N186 End stage renal disease: Secondary | ICD-10-CM | POA: Diagnosis not present

## 2021-02-16 DIAGNOSIS — F0390 Unspecified dementia without behavioral disturbance: Secondary | ICD-10-CM | POA: Diagnosis not present

## 2021-02-16 DIAGNOSIS — D631 Anemia in chronic kidney disease: Secondary | ICD-10-CM | POA: Diagnosis not present

## 2021-02-16 NOTE — Telephone Encounter (Signed)
Patient's wife [name] called re: RX for Forteo requires a PA (without PA cost is $4,100.00). [name] states [facility] told her they sent a PA form to Dr. Dwyane Dee earlier this week with no response.

## 2021-02-17 DIAGNOSIS — D509 Iron deficiency anemia, unspecified: Secondary | ICD-10-CM | POA: Diagnosis not present

## 2021-02-17 DIAGNOSIS — N186 End stage renal disease: Secondary | ICD-10-CM | POA: Diagnosis not present

## 2021-02-17 DIAGNOSIS — Z992 Dependence on renal dialysis: Secondary | ICD-10-CM | POA: Diagnosis not present

## 2021-02-17 DIAGNOSIS — N2581 Secondary hyperparathyroidism of renal origin: Secondary | ICD-10-CM | POA: Diagnosis not present

## 2021-02-17 DIAGNOSIS — E785 Hyperlipidemia, unspecified: Secondary | ICD-10-CM | POA: Diagnosis not present

## 2021-02-18 DIAGNOSIS — F0390 Unspecified dementia without behavioral disturbance: Secondary | ICD-10-CM | POA: Diagnosis not present

## 2021-02-18 DIAGNOSIS — N186 End stage renal disease: Secondary | ICD-10-CM | POA: Diagnosis not present

## 2021-02-18 DIAGNOSIS — I120 Hypertensive chronic kidney disease with stage 5 chronic kidney disease or end stage renal disease: Secondary | ICD-10-CM | POA: Diagnosis not present

## 2021-02-18 DIAGNOSIS — E1122 Type 2 diabetes mellitus with diabetic chronic kidney disease: Secondary | ICD-10-CM | POA: Diagnosis not present

## 2021-02-18 DIAGNOSIS — D631 Anemia in chronic kidney disease: Secondary | ICD-10-CM | POA: Diagnosis not present

## 2021-02-18 DIAGNOSIS — S72144D Nondisplaced intertrochanteric fracture of right femur, subsequent encounter for closed fracture with routine healing: Secondary | ICD-10-CM | POA: Diagnosis not present

## 2021-02-20 ENCOUNTER — Encounter (HOSPITAL_COMMUNITY): Payer: Self-pay

## 2021-02-21 ENCOUNTER — Telehealth: Payer: Self-pay | Admitting: Endocrinology

## 2021-02-21 ENCOUNTER — Other Ambulatory Visit: Payer: Self-pay

## 2021-02-21 ENCOUNTER — Other Ambulatory Visit: Payer: Medicare Other | Admitting: Nurse Practitioner

## 2021-02-21 ENCOUNTER — Encounter: Payer: Self-pay | Admitting: Nurse Practitioner

## 2021-02-21 DIAGNOSIS — S72144D Nondisplaced intertrochanteric fracture of right femur, subsequent encounter for closed fracture with routine healing: Secondary | ICD-10-CM | POA: Diagnosis not present

## 2021-02-21 DIAGNOSIS — N186 End stage renal disease: Secondary | ICD-10-CM | POA: Diagnosis not present

## 2021-02-21 DIAGNOSIS — Z515 Encounter for palliative care: Secondary | ICD-10-CM

## 2021-02-21 DIAGNOSIS — R630 Anorexia: Secondary | ICD-10-CM | POA: Diagnosis not present

## 2021-02-21 DIAGNOSIS — D631 Anemia in chronic kidney disease: Secondary | ICD-10-CM | POA: Diagnosis not present

## 2021-02-21 DIAGNOSIS — F0390 Unspecified dementia without behavioral disturbance: Secondary | ICD-10-CM | POA: Diagnosis not present

## 2021-02-21 DIAGNOSIS — E1122 Type 2 diabetes mellitus with diabetic chronic kidney disease: Secondary | ICD-10-CM | POA: Diagnosis not present

## 2021-02-21 DIAGNOSIS — I120 Hypertensive chronic kidney disease with stage 5 chronic kidney disease or end stage renal disease: Secondary | ICD-10-CM | POA: Diagnosis not present

## 2021-02-21 NOTE — Progress Notes (Signed)
[facility] Consult Note Telephone: [phone]  Fax: [phone]    Date of encounter: 02/21/21 PATIENT NAME: [name] [address] [place] [address]   [phone] (home)  DOB: [birth year] MRN: [id number] PRIMARY CARE PROVIDER:    Elby Showers, MD,  [address] [address] [id number] RESPONSIBLE PARTY:    Contact Information    Name Relation Home Work Mobile   Torrisi,Shirley Spouse [phone]  [phone]   Hottinger,Crystal Daughter [phone]  [phone]   Summers,Tammy Niece [phone]  [phone]     I met face to face with patient and wife in home. Palliative Care was asked to follow this patient by consultation request of  Baxley, Cresenciano Lick, MD to address advance care planning and complex medical decision making. This is the initial visit.   ASSESSMENT AND PLAN / RECOMMENDATIONS:   Advance Care Planning/Goals of Care: Goals include to maximize quality of life and symptom management. Our advance care planning conversation included a discussion about:     The value and importance of advance care planning   Experiences with loved ones who have been seriously ill or have died   Exploration of personal, cultural or spiritual beliefs that might influence medical decisions   Exploration of goals of care in the event of a sudden injury or illness   Identification and preparation of a healthcare agent   Review and updating or creation of an  advance directive document .  Decision not to resuscitate or to de-escalate disease focused treatments due to poor prognosis  Symptom Management/Plan: 1. ACP; discussed at length about code status with Mr and Mrs. [name]. Mrs. Dull she does not think Mr Noren would want to have CPR. Mrs. Lowrimore endorses they would not want to be kept alive by respirators. We reviewed MOST form, Blank MOST form given and Hard Choice book. Mrs. Debow was going to review  living will they had drawn up and further discuss with daughter Crystal.  2. Anorexia; irritability; secondary to dementia; we talked at length about nutrition, dietician consult recommended at dialysis center, Mrs Yusuf endorses she will call and request an appointment with dietician. We talked about weights, supplements. We talked about muscle wasting; We talked about cognitive and functional decline overall. We talked about behaviors, irritability, argumentative at times. We talked about medications. We talked about poor sleep patterns, up most of the night. Mr. Sheek becomes very irritable when awoke to go to bed after falling asleep in the recliner. We talked about starting remeron, may help with depression/mood; sleep pattern as well as appetite stimulant. Rx sent. Discussed will also ask about enrolling in RN/SW PC program for monthly visits, monitoring; Mrs Currier in agreement.  Rx: remeron 74m qhs; 1 RF; #30 escribed  3. Goals of Care: Goals include to maximize quality of life and symptom management. Our advance care planning conversation included a discussion about:     The value and importance of advance care planning   Exploration of personal, cultural or spiritual beliefs that might influence medical decisions   Exploration of goals of care in the event of a sudden injury or illness   Identification and preparation of a healthcare agent   Review and updating or creation of an advance directive document.  4. Palliative care encounter; Palliative care encounter; Palliative medicine team will continue to support patient, patient's family, and medical team. Visit consisted of counseling and education dealing with the complex and emotionally intense issues  of symptom management and palliative care in the setting of serious and potentially life-threatening illness  Follow up Palliative Care Visit: Palliative care will continue to follow for complex medical decision making, advance care  planning, and clarification of goals. Return 6 weeks or prn.  I spent 75 minutes providing this consultation. More than 50% of the time in this consultation was spent in counseling and care coordination.  PPS:40%  HOSPICE ELIGIBILITY/DIAGNOSIS: TBD  Chief Complaint: Initial palliative consult for complex medical decision making  HISTORY OF PRESENT ILLNESS:  [name] is a 80 y.o. year old male  with multiple medical problems including ESRD requiring HD, Alzheimer's disease, anemia, CAD, PVD, DM, HTN, HLD, sleep apnea, hypothyroidism, gerd, gout, arthritis, h/o hiatal hernia, h/o diverticulitis, ED, seasonal allergies, cataract, h/o burr holes, AV fistula placement, breast sgy left - granulomatous mastitis, aortobifemoral bypass, angioplasty. Initial Palliative consult for complex medical decision making, called Mrs. Villagran to confirm [facility] appointment and covid screening negative. I visited Mr and Mrs Somera in their home. Mr Angerer was sitting in the chair in his room. Mr. Pascucci appears thin, mildly cognitively impaired. Mr Melroy had difficulty with memory, processing questions. We talked about the last time Mr Stoiber was independent. Mr. Hoehn retired from [employer] and continues to be followed by [place]. Mr. and Mrs. Steel were high school sweethearts, married early in life, having 1 daughter, Crystal. We talked about life review. We talked about PMH, chronic diseases including ESRD requiring HD on M/W/F. We talked about how he feels about dialysis though vague with answers. Mr. Schaller endorses he has to go to dialysis. Mr. Laduca endorses he talked about getting a transplant but once evaluation was completed was told he was not a candidate. Mr. Hilleary endorses he does not understand why. We talked about co-morbidities, age, Mrs. Ferron also talked about reasons why Mr. Nesheiwat was declined. We talked about Dementia with progression. We talked about Mr. Renz functional ability, he is able to walk with walker. He requires to have  moderate assistance with bathing, dressing. Mrs. Chait endorses she does have a caregiver who comes in several days a week to hep Mr. [name] but Mrs. Coutts endorses it would be helpful having more help on the weekend. Mrs. Tippy endorses the CNA help comes from his veterans benefits. She is hoping more resources will be available through the [place]. We talked about symptoms of pain in right lower leg worse with movement. We talked about recent hospitalization with time in short term rehab, now progressed home with physical therapy in home. Mrs. Reaver endorses nursing, occupational have completed and physical therapy have a few more weeks. We talked about behaviors, Mrs. Scheck endorses Mr. Bohorquez becomes irritable at times, difficult with getting him to do things. We talked about dementia with depression, frustration with cognitive impairment. We talked about sleep, sleep pattern and sleep hygiene. We talked about Mr. Sheerin staying awake into 2 or 3 in the am and sleeping late. Mrs. Whidbee endorses Mr. Willemsen will fall asleep in the chair. When Mrs. Campanaro tries to wake him up to help him to bed, he becomes irritable. We talked about behaviors, irritability, argumentative at times. We talked about medications. We talked about poor sleep patterns, up most of the night. We talked about Mr. Leven appetite, declined. We talked about nutrition. We talked about recent weight loss >30lbs but appetite has slightly improved since home. Mrs. [name] and I talked about small portions frequently with nephro supplements. We talked  about talking more with dietician at dialysis center. Mrs. Schweikert endorses she will contact for appointment further discussion. We talked about starting remeron, may help with depression/mood; sleep pattern as well as appetite stimulant. Rx sent. We talked about medical goals of care including aggressive, conservative, comfort care. We talked about code status. MOST form reviewed; blank MOST form left with Hard Choice book for  further review with daughter, Crystal and discussions. We talked about living will. We talked about role PC in POC. We talked about RN/SW program for f/u visit will see if he would benefit. We talked about PC SW consult for further exportation of resources available through [place]. We talked about f/u PC visit, scheduled. Therapeutic listening, emotional support provided. Questions answered to satisfaction  History obtained from review of EMR, discussion with Mrs. and  Mr. [name].  I reviewed available labs, medications, imaging, studies and related documents from the EMR.  Records reviewed and summarized above.   ROS Full 14 system review of systems performed and negative with exception of: as per HPI.  Physical Exam: Constitutional: NAD General: frail appearing, thin, chronically ill male EYES:  lids intact ENMT: oral mucous membranes moist CV: S1S2, RRR, no LE edema Pulmonary: LCTA, no increased work of breathing, no cough, room air Abdomen: normo-active BS + 4 quadrants, soft and non tender GU: deferred MSK: ambulatory with walker; muscle wasting Skin: warm and dry, no rashes or wounds on visible skin Neuro:  + generalized weakness,  +cognitive impairment Psych: non-anxious affect, A and O x 2  CURRENT PROBLEM LIST:  Patient Active Problem List   Diagnosis Date Noted  . Alzheimer's disease, unspecified (CODE) ([place]) 01/24/2021  . Senile nuclear sclerosis 11/22/2020  . Other specified erythematous condition 11/22/2020  . Other specified disease of sebaceous glands 11/22/2020  . Osteoporosis 11/22/2020  . Obesity 11/22/2020  . Macrocytosis 11/22/2020  . Dystrophia unguium 11/22/2020  . Tinea pedis 11/22/2020  . Pre-operative clearance   . Closed nondisplaced intertrochanteric fracture of right femur ([place]) 11/07/2020  . Hip fracture ([place]) 11/07/2020  . Fall   . Rectal bleeding 04/22/2020  . AVM (arteriovenous malformation) of small bowel, acquired   . Hiatal hernia   . Schatzki's  ring of distal esophagus   . Other bacterial infections of unspecified site 08/31/2019  . Hypocalcemia 04/06/2019  . Melena 11/19/2018  . Leukocytosis   . Anemia   . TBI (traumatic brain injury) ([place]) 07/29/2018  . 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 Loose stools   . Dementia arising in the senium and presenium ([place])   . 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 Multiple fractures of ribs, left side, initial encounter for closed fracture 07/21/2018  . Subdural hematoma ([place]) 07/21/2018  . Gastritis, unspecified, with bleeding 06/04/2018  . Angiodysplasia of stomach   . Occult GI bleeding   . GIB (gastrointestinal bleeding) 06/01/2018  . Low back pain 01/17/2018  . Anorexia   . Diverticulitis of intestine, part unspecified, without perforation or abscess without bleeding 08/07/2017  . Dyspnea   . Macrocytic anemia 06/04/2016  . Thrombocytopenia ([place]) 06/04/2016  . Exertional dyspnea 06/04/2016  . Arm paresthesia, left 06/04/2016  . Dyspnea on exertion 06/04/2016  . Tenderness of right calf 06/04/2016  . Mild cognitive impairment with memory loss 01/12/2016  . ESRD on dialysis ([place]) 05/17/2015  . Hyperlipidemia, unspecified 02/25/2015  . Pruritus, unspecified 02/25/2015  . Pain, unspecified 02/25/2015  . Other specified coagulation defects ([place]) 02/25/2015  .  Iron deficiency anemia, unspecified 02/25/2015  . Headache, unspecified 02/25/2015  . Gout, unspecified 02/25/2015  . Fever, unspecified 02/25/2015  . Diarrhea, unspecified 02/25/2015  . Anemia in chronic kidney disease 02/25/2015  . Acute embolism and thrombosis of left subclavian vein ([place]) 02/25/2015  . Hypoglycemia   . Weakness 02/18/2015  . OSA (obstructive sleep apnea) 02/02/2015  . Hyperkalemia 10/05/2014  . End stage renal disease ([place]) 11/18/2013  . BPH (benign prostatic hyperplasia) 11/22/2012  . Peripheral vascular disease ([place]) 12/24/2011  . Hyperparathyroidism ([place])  12/24/2011  . Hypothyroidism 12/24/2011  . Allergic rhinitis 12/24/2011  . Erectile dysfunction 12/24/2011  . Insulin dependent diabetes mellitus 09/03/2008  . Essential hypertension 08/30/2008  . PANCREATITIS, HX OF 08/30/2008  . RENAL FAILURE, ACUTE, HX OF 08/30/2008  . DIVERTICULOSIS, COLON 06/08/2003   PAST MEDICAL HISTORY:  Active Ambulatory Problems    Diagnosis Date Noted  . Insulin dependent diabetes mellitus 09/03/2008  . Essential hypertension 08/30/2008  . DIVERTICULOSIS, COLON 06/08/2003  . PANCREATITIS, HX OF 08/30/2008  . RENAL FAILURE, ACUTE, HX OF 08/30/2008  . Peripheral vascular disease ([place]) 12/24/2011  . Hyperparathyroidism ([place]) 12/24/2011  . Hypothyroidism 12/24/2011  . Allergic rhinitis 12/24/2011  . Erectile dysfunction 12/24/2011  . BPH (benign prostatic hyperplasia) 11/22/2012  . End stage renal disease ([place]) 11/18/2013  . Hyperkalemia 10/05/2014  . OSA (obstructive sleep apnea) 02/02/2015  . Weakness 02/18/2015  . Hypoglycemia   . ESRD on dialysis ([place]) 05/17/2015  . Mild cognitive impairment with memory loss 01/12/2016  . Macrocytic anemia 06/04/2016  . Thrombocytopenia ([place]) 06/04/2016  . Exertional dyspnea 06/04/2016  . Arm paresthesia, left 06/04/2016  . Dyspnea on exertion 06/04/2016  . Tenderness of right calf 06/04/2016  . Dyspnea   . Low back pain 01/17/2018  . Anorexia   . GIB (gastrointestinal bleeding) 06/01/2018  . Occult GI bleeding   . Angiodysplasia of stomach   . 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 Multiple fractures of ribs, left side, initial encounter for closed fracture 07/21/2018  . Subdural hematoma ([place]) 07/21/2018  . TBI (traumatic brain injury) ([place]) 07/29/2018  . 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 Loose stools   . Dementia arising in the senium and presenium ([place])   . Anemia   . Leukocytosis   . Melena 11/19/2018  . AVM (arteriovenous malformation) of small bowel,  acquired   . Hiatal hernia   . Schatzki's ring of distal esophagus   . Rectal bleeding 04/22/2020  . Closed nondisplaced intertrochanteric fracture of right femur ([place]) 11/07/2020  . Hip fracture ([place]) 11/07/2020  . Fall   . Pre-operative clearance   . Hyperlipidemia, unspecified 02/25/2015  . Senile nuclear sclerosis 11/22/2020  . Pruritus, unspecified 02/25/2015  . Pain, unspecified 02/25/2015  . Other specified erythematous condition 11/22/2020  . Other specified disease of sebaceous glands 11/22/2020  . Other specified coagulation defects ([place]) 02/25/2015  . Other bacterial infections of unspecified site 08/31/2019  . Osteoporosis 11/22/2020  . Obesity 11/22/2020  . Macrocytosis 11/22/2020  . Iron deficiency anemia, unspecified 02/25/2015  . Hypocalcemia 04/06/2019  . Headache, unspecified 02/25/2015  . Gout, unspecified 02/25/2015  . Gastritis, unspecified, with bleeding 06/04/2018  . Fever, unspecified 02/25/2015  . Dystrophia unguium 11/22/2020  . Diverticulitis of intestine, part unspecified, without perforation or abscess without bleeding 08/07/2017  . Diarrhea, unspecified 02/25/2015  . Tinea pedis 11/22/2020  . Anemia in chronic kidney disease 02/25/2015  . Acute embolism and thrombosis of left  subclavian vein ([place]) 02/25/2015  . Alzheimer's disease, unspecified (CODE) ([place]) 01/24/2021   Resolved Ambulatory Problems    Diagnosis Date Noted  . Hyperlipidemia 09/03/2008  . CAD 09/03/2008  . Chronic kidney disease (CKD), stage IV (severe) ([place]) 04/07/2014  . Chest pain at rest 06/04/2016  . Labile blood pressure   . Seizure prophylaxis   . Labile blood glucose   . Clotted dialysis access [facility])    Past Medical History:  Diagnosis Date  . Allergy   . Alzheimer's disease ([place]) 01/19/2021  . Arthritis   . Cataract   . Coronary artery disease   . Diabetes mellitus   . Diverticulitis   . ED (erectile dysfunction)   . Elevated homocysteine   . ESRD (end stage  renal disease) on dialysis ([place]) 03/2015  . GERD (gastroesophageal reflux disease)   . Gout   . Hypertension   . Pneumonia   . PVD (peripheral vascular disease) ([place])   . Renal insufficiency   . Seasonal allergies   . Sleep apnea    SOCIAL HX:  Social History   Tobacco Use  . Smoking status: Former Smoker    Types: Cigarettes    Quit date: 09/24/1994    Years since quitting: 26.4  . Smokeless tobacco: Never Used  Substance Use Topics  . Alcohol use: No    Alcohol/week: 0.0 standard drinks    Comment: Quit Oct. 1977 ("somewhat heavy")   FAMILY HX:  Family History  Problem Relation Age of Onset  . Aneurysm Mother        brain  . Heart disease Father   . Stroke Father   . Hypertension Father   . Diabetes Father   . Dementia Neg Hx   . Colon cancer Neg Hx   . Esophageal cancer Neg Hx   . Pancreatic cancer Neg Hx   . Prostate cancer Neg Hx   . Rectal cancer Neg Hx   . Stomach cancer Neg Hx   Reviewed  ALLERGIES:  Allergies  Allergen Reactions  . Ambien [Zolpidem Tartrate] Other (See Comments)    Hallucinations and "felt crazy"   . Penicillins Rash and Hives    Has patient had a PCN reaction causing immediate rash, facial/tongue/throat swelling, SOB or lightheadedness with hypotension: Yes Has patient had a PCN reaction causing severe rash involving mucus membranes or skin necrosis: Yes Has patient had a PCN reaction that required hospitalization: No Has patient had a PCN reaction occurring within the last 10 years: No If all of the above answers are "NO", then may proceed with Cephalosporin use.  Other reaction(s): HIVES     PERTINENT MEDICATIONS:  Outpatient Encounter Medications as of 02/21/2021  Medication Sig  . acetaminophen (TYLENOL) 325 MG tablet Take 1-2 tablets (325-650 mg total) by mouth every 4 (four) hours as needed for mild pain.  Marland Kitchen albuterol (VENTOLIN HFA) 108 (90 Base) MCG/ACT inhaler Inhale 1 puff into the lungs daily as needed for wheezing or  shortness of breath.  . allopurinol (ZYLOPRIM) 100 MG tablet Take 100 mg by mouth daily.  . Blood Glucose Monitoring Suppl (FREESTYLE FREEDOM LITE) w/Device KIT Use to check blood sugar 2 times per day dx code E11.65  . camphor-menthol (SARNA) lotion Apply 1 application topically 4 (four) times daily as needed for itching.   . Carboxymethylcellulose Sodium (THERATEARS) 0.25 % SOLN Place 1 drop into both eyes as needed (for dryness).  . cetirizine (ZYRTEC) 10 MG chewable tablet Chew 10 mg by mouth every Monday,  Wednesday, and Friday.  . clobetasol (TEMOVATE) 0.05 % external solution Apply 1 application topically 2 (two) times daily as needed (scalp irritation.).   Marland Kitchen donepezil (ARICEPT) 10 MG tablet Take 1 tablet (10 mg total) by mouth at bedtime.  Marland Kitchen FLUOCINOLONE ACETONIDE SCALP 0.01 % OIL Apply 1 application topically daily as needed (scalp irritation.).   Marland Kitchen fluticasone (FLONASE) 50 MCG/ACT nasal spray Place 1 spray into both nostrils daily.  Marland Kitchen glucose blood (FREESTYLE LITE) test strip USE AS DIRECTED THREE TIMES DAILY  . HYDROcodone-acetaminophen (NORCO/VICODIN) 5-325 MG tablet Take 1 tablet by mouth every 4 (four) hours as needed for moderate pain.  . hydrocortisone 2.5 % cream Apply 1 application topically 3 (three) times daily as needed (skin irritation (legs & arms)).   Marland Kitchen insulin aspart (NOVOLOG FLEXPEN) 100 UNIT/ML FlexPen INJECT 6-8 UNITS UNDER THE SKIN THREE TIMES DAILY BEFORE MEALS. DX:E11.65 (Patient taking differently: Inject 6-8 Units into the skin 3 (three) times daily with meals. INJECT 6-8 UNITS UNDER THE SKIN THREE TIMES DAILY BEFORE MEALS. DX:E11.65)  . Insulin Pen Needle (PEN NEEDLES) 32G X 4 MM MISC 1 each by Does not apply route 3 (three) times daily. USE PEN NEEDLES TO INJECT INSULIN THREE TIMES DAILY.  Marland Kitchen ketoconazole (NIZORAL) 2 % shampoo Apply 1 application topically 2 (two) times a week.  . Lancets (FREESTYLE) lancets Use as instructed to check blood sugar 2 times per day dx  code E11.65  . levothyroxine (SYNTHROID, LEVOTHROID) 50 MCG tablet Take 1 tablet (50 mcg total) by mouth daily. (Patient taking differently: Take 50 mcg by mouth daily before breakfast.)  . lidocaine (LIDODERM) 5 % Place 1 patch onto the skin daily as needed (back pain).   . memantine (NAMENDA) 10 MG tablet Take 1 tablet (10 mg total) by mouth 2 (two) times daily.  . midodrine (PROAMATINE) 10 MG tablet Take one pill prior to hemodialysis on MWF (Patient taking differently: Take 5 mg by mouth every Monday, Wednesday, and Friday. Take one pill prior to hemodialysis on MWF)  . Nutritional Supplements (FEEDING SUPPLEMENT, NEPRO CARB STEADY,) LIQD Take 237 mLs by mouth in the morning and at bedtime.   Marland Kitchen omeprazole (PRILOSEC) 40 MG capsule Take 1 capsule (40 mg total) by mouth daily.  . sevelamer carbonate (RENVELA) 800 MG tablet Take 1,600-2,400 mg by mouth See admin instructions. Take 2,400 mg by mouth three times a day with meals and 1,600 mg with snacks on Sun/Tues/Thurs/Sat and 2,400 mg with breakfast and dinner and nothing with snacks on Mon/Wed/Fri  . simvastatin (ZOCOR) 20 MG tablet Take 20 mg by mouth at bedtime.  . Teriparatide, Recombinant, (FORTEO) 600 MCG/2.4ML SOPN Inject 20 mcg subcutaneously daily  . TRADJENTA 5 MG TABS tablet TAKE 1 TABLET DAILY (Patient taking differently: Take 5 mg by mouth daily.)   No facility-administered encounter medications on file as of 02/21/2021.   Questions and concerns were addressed. The patient/family was encouraged to call with questions and/or concerns. My business card was provided. Provided general support and encouragement, no other unmet needs identified  Thank you for the opportunity to participate in the care of Mr. Bas.  The palliative care team will continue to follow. Please call our office at [phone] if we can be of additional assistance.   This chart was dictated using voice recognition software. Despite best efforts to proofread,  errors can occur which can change the documentation meaning.   Torsha Lemus Z Camella Seim, NP , MSN,  ACHPN  COVID-19 PATIENT SCREENING TOOL Asked  and negative response unless otherwise noted:   Have you had symptoms of covid, tested positive or been in contact with someone with symptoms/positive test in the past 5-10 days? NO

## 2021-02-21 NOTE — Telephone Encounter (Signed)
New message   Prior authorization   Teriparatide, Recombinant, (FORTEO) 600 MCG/2.4ML SOPN

## 2021-02-22 DIAGNOSIS — Z992 Dependence on renal dialysis: Secondary | ICD-10-CM | POA: Diagnosis not present

## 2021-02-22 DIAGNOSIS — E119 Type 2 diabetes mellitus without complications: Secondary | ICD-10-CM | POA: Diagnosis not present

## 2021-02-22 DIAGNOSIS — E1129 Type 2 diabetes mellitus with other diabetic kidney complication: Secondary | ICD-10-CM | POA: Diagnosis not present

## 2021-02-22 DIAGNOSIS — N2581 Secondary hyperparathyroidism of renal origin: Secondary | ICD-10-CM | POA: Diagnosis not present

## 2021-02-22 DIAGNOSIS — S72144D Nondisplaced intertrochanteric fracture of right femur, subsequent encounter for closed fracture with routine healing: Secondary | ICD-10-CM | POA: Diagnosis not present

## 2021-02-22 DIAGNOSIS — E876 Hypokalemia: Secondary | ICD-10-CM | POA: Diagnosis not present

## 2021-02-22 DIAGNOSIS — D631 Anemia in chronic kidney disease: Secondary | ICD-10-CM | POA: Diagnosis not present

## 2021-02-22 DIAGNOSIS — E1122 Type 2 diabetes mellitus with diabetic chronic kidney disease: Secondary | ICD-10-CM | POA: Diagnosis not present

## 2021-02-22 DIAGNOSIS — D509 Iron deficiency anemia, unspecified: Secondary | ICD-10-CM | POA: Diagnosis not present

## 2021-02-22 DIAGNOSIS — I120 Hypertensive chronic kidney disease with stage 5 chronic kidney disease or end stage renal disease: Secondary | ICD-10-CM | POA: Diagnosis not present

## 2021-02-22 DIAGNOSIS — F0390 Unspecified dementia without behavioral disturbance: Secondary | ICD-10-CM | POA: Diagnosis not present

## 2021-02-22 DIAGNOSIS — N186 End stage renal disease: Secondary | ICD-10-CM | POA: Diagnosis not present

## 2021-02-23 ENCOUNTER — Telehealth: Payer: Self-pay | Admitting: Internal Medicine

## 2021-02-23 ENCOUNTER — Telehealth: Payer: Self-pay | Admitting: Endocrinology

## 2021-02-23 DIAGNOSIS — D631 Anemia in chronic kidney disease: Secondary | ICD-10-CM | POA: Diagnosis not present

## 2021-02-23 DIAGNOSIS — S72144D Nondisplaced intertrochanteric fracture of right femur, subsequent encounter for closed fracture with routine healing: Secondary | ICD-10-CM | POA: Diagnosis not present

## 2021-02-23 DIAGNOSIS — F0390 Unspecified dementia without behavioral disturbance: Secondary | ICD-10-CM | POA: Diagnosis not present

## 2021-02-23 DIAGNOSIS — E1122 Type 2 diabetes mellitus with diabetic chronic kidney disease: Secondary | ICD-10-CM | POA: Diagnosis not present

## 2021-02-23 DIAGNOSIS — I120 Hypertensive chronic kidney disease with stage 5 chronic kidney disease or end stage renal disease: Secondary | ICD-10-CM | POA: Diagnosis not present

## 2021-02-23 DIAGNOSIS — N186 End stage renal disease: Secondary | ICD-10-CM | POA: Diagnosis not present

## 2021-02-23 NOTE — Telephone Encounter (Signed)
New message  Prior authorization on  Teriparatide, Recombinant, (FORTEO) 600 MCG/2.4ML SOPN

## 2021-02-23 NOTE — Telephone Encounter (Signed)
Stacy --  Physical Therapist Latricia Heft / [facility] called to say that patient is beginning to make really good progress and they would like to continue physical therapy. I ask her to fax orders over so they could be signed off on.

## 2021-02-24 ENCOUNTER — Encounter (HOSPITAL_COMMUNITY): Payer: Self-pay

## 2021-02-24 DIAGNOSIS — E876 Hypokalemia: Secondary | ICD-10-CM | POA: Diagnosis not present

## 2021-02-24 DIAGNOSIS — N186 End stage renal disease: Secondary | ICD-10-CM | POA: Diagnosis not present

## 2021-02-24 DIAGNOSIS — D509 Iron deficiency anemia, unspecified: Secondary | ICD-10-CM | POA: Diagnosis not present

## 2021-02-24 DIAGNOSIS — Z992 Dependence on renal dialysis: Secondary | ICD-10-CM | POA: Diagnosis not present

## 2021-02-24 DIAGNOSIS — N2581 Secondary hyperparathyroidism of renal origin: Secondary | ICD-10-CM | POA: Diagnosis not present

## 2021-02-24 NOTE — Telephone Encounter (Signed)
Attempted PA through CoverMyMeds, their response was "To initiate an authorization request for this medication, please contact [facility] at [phone]."  PA form to be filled out

## 2021-02-25 DIAGNOSIS — D631 Anemia in chronic kidney disease: Secondary | ICD-10-CM | POA: Diagnosis not present

## 2021-02-25 DIAGNOSIS — Z794 Long term (current) use of insulin: Secondary | ICD-10-CM | POA: Diagnosis not present

## 2021-02-25 DIAGNOSIS — N186 End stage renal disease: Secondary | ICD-10-CM | POA: Diagnosis not present

## 2021-02-25 DIAGNOSIS — I4891 Unspecified atrial fibrillation: Secondary | ICD-10-CM | POA: Diagnosis not present

## 2021-02-25 DIAGNOSIS — R1312 Dysphagia, oropharyngeal phase: Secondary | ICD-10-CM | POA: Diagnosis not present

## 2021-02-25 DIAGNOSIS — Z7982 Long term (current) use of aspirin: Secondary | ICD-10-CM | POA: Diagnosis not present

## 2021-02-25 DIAGNOSIS — I120 Hypertensive chronic kidney disease with stage 5 chronic kidney disease or end stage renal disease: Secondary | ICD-10-CM | POA: Diagnosis not present

## 2021-02-25 DIAGNOSIS — Z7984 Long term (current) use of oral hypoglycemic drugs: Secondary | ICD-10-CM | POA: Diagnosis not present

## 2021-02-25 DIAGNOSIS — I739 Peripheral vascular disease, unspecified: Secondary | ICD-10-CM | POA: Diagnosis not present

## 2021-02-25 DIAGNOSIS — Z992 Dependence on renal dialysis: Secondary | ICD-10-CM | POA: Diagnosis not present

## 2021-02-25 DIAGNOSIS — F0390 Unspecified dementia without behavioral disturbance: Secondary | ICD-10-CM | POA: Diagnosis not present

## 2021-02-25 DIAGNOSIS — S72144D Nondisplaced intertrochanteric fracture of right femur, subsequent encounter for closed fracture with routine healing: Secondary | ICD-10-CM | POA: Diagnosis not present

## 2021-02-25 DIAGNOSIS — E1122 Type 2 diabetes mellitus with diabetic chronic kidney disease: Secondary | ICD-10-CM | POA: Diagnosis not present

## 2021-02-27 ENCOUNTER — Telehealth: Payer: Self-pay | Admitting: Internal Medicine

## 2021-02-27 DIAGNOSIS — D509 Iron deficiency anemia, unspecified: Secondary | ICD-10-CM | POA: Diagnosis not present

## 2021-02-27 DIAGNOSIS — Z992 Dependence on renal dialysis: Secondary | ICD-10-CM | POA: Diagnosis not present

## 2021-02-27 DIAGNOSIS — N2581 Secondary hyperparathyroidism of renal origin: Secondary | ICD-10-CM | POA: Diagnosis not present

## 2021-02-27 DIAGNOSIS — N186 End stage renal disease: Secondary | ICD-10-CM | POA: Diagnosis not present

## 2021-02-27 DIAGNOSIS — E876 Hypokalemia: Secondary | ICD-10-CM | POA: Diagnosis not present

## 2021-02-27 DIAGNOSIS — S72144D Nondisplaced intertrochanteric fracture of right femur, subsequent encounter for closed fracture with routine healing: Secondary | ICD-10-CM | POA: Diagnosis not present

## 2021-02-27 NOTE — Telephone Encounter (Signed)
Faxed re certified orders to [facility] [phone], phone [phone]  Certification period 02/25/2021 to 04/25/2021 Order # [id number]

## 2021-02-28 ENCOUNTER — Telehealth: Payer: Self-pay

## 2021-02-28 DIAGNOSIS — E1122 Type 2 diabetes mellitus with diabetic chronic kidney disease: Secondary | ICD-10-CM | POA: Diagnosis not present

## 2021-02-28 DIAGNOSIS — F0390 Unspecified dementia without behavioral disturbance: Secondary | ICD-10-CM | POA: Diagnosis not present

## 2021-02-28 DIAGNOSIS — N186 End stage renal disease: Secondary | ICD-10-CM | POA: Diagnosis not present

## 2021-02-28 DIAGNOSIS — S72144D Nondisplaced intertrochanteric fracture of right femur, subsequent encounter for closed fracture with routine healing: Secondary | ICD-10-CM | POA: Diagnosis not present

## 2021-02-28 DIAGNOSIS — I120 Hypertensive chronic kidney disease with stage 5 chronic kidney disease or end stage renal disease: Secondary | ICD-10-CM | POA: Diagnosis not present

## 2021-02-28 DIAGNOSIS — D631 Anemia in chronic kidney disease: Secondary | ICD-10-CM | POA: Diagnosis not present

## 2021-02-28 NOTE — Telephone Encounter (Signed)
02/28/21 @215PM : Palliative care SW outreached patients spouse, per PC NP-C. Gusler request, to assess needs and provide insight around dementia support and VA benefits.  Call unsuccessful. SW LVM VM. Awaiting return call.   Resources to provide to spouse: [facility]  [address], [place], [place] [address]  In the [place]  [phone]   [facility] Dementia navigation program [phone] [email or web address]  Our professional staff can assist with: . Developing a better understanding of the disease . Discussing options for symptom management . Addressing the emotional impact of the disease . Connecting you with additional community resources . Assistance with planning and problem-solving . Local and personalized resources and referrals . Assistance funding including respite funds Caregiver Assistance Funds

## 2021-03-01 ENCOUNTER — Other Ambulatory Visit: Payer: Self-pay | Admitting: *Deleted

## 2021-03-01 DIAGNOSIS — E876 Hypokalemia: Secondary | ICD-10-CM | POA: Diagnosis not present

## 2021-03-01 DIAGNOSIS — N186 End stage renal disease: Secondary | ICD-10-CM | POA: Diagnosis not present

## 2021-03-01 DIAGNOSIS — N2581 Secondary hyperparathyroidism of renal origin: Secondary | ICD-10-CM | POA: Diagnosis not present

## 2021-03-01 DIAGNOSIS — D509 Iron deficiency anemia, unspecified: Secondary | ICD-10-CM | POA: Diagnosis not present

## 2021-03-01 DIAGNOSIS — Z992 Dependence on renal dialysis: Secondary | ICD-10-CM | POA: Diagnosis not present

## 2021-03-01 NOTE — Telephone Encounter (Signed)
Spoken to patient's wife regarding application. [place] patient assistance program applications for patient to pick up in the front office.

## 2021-03-01 NOTE — Patient Outreach (Signed)
[facility]) Care Management  [place]  03/01/2021   [name] Aug 03, 1941 [id number]  Subjective: Telephone outreach to follow up on pt general health. He now has Palliative Care services in place.  Encounter Medications:  Outpatient Encounter Medications as of 03/01/2021  Medication Sig  . acetaminophen (TYLENOL) 325 MG tablet Take 1-2 tablets (325-650 mg total) by mouth every 4 (four) hours as needed for mild pain.  Marland Kitchen albuterol (VENTOLIN HFA) 108 (90 Base) MCG/ACT inhaler Inhale 1 puff into the lungs daily as needed for wheezing or shortness of breath.  . allopurinol (ZYLOPRIM) 100 MG tablet Take 100 mg by mouth daily.  . Blood Glucose Monitoring Suppl (FREESTYLE FREEDOM LITE) w/Device KIT Use to check blood sugar 2 times per day dx code E11.65  . camphor-menthol (SARNA) lotion Apply 1 application topically 4 (four) times daily as needed for itching.   . Carboxymethylcellulose Sodium (THERATEARS) 0.25 % SOLN Place 1 drop into both eyes as needed (for dryness).  . cetirizine (ZYRTEC) 10 MG chewable tablet Chew 10 mg by mouth every Monday, Wednesday, and Friday.  . clobetasol (TEMOVATE) 0.05 % external solution Apply 1 application topically 2 (two) times daily as needed (scalp irritation.).   Marland Kitchen donepezil (ARICEPT) 10 MG tablet Take 1 tablet (10 mg total) by mouth at bedtime.  Marland Kitchen FLUOCINOLONE ACETONIDE SCALP 0.01 % OIL Apply 1 application topically daily as needed (scalp irritation.).   Marland Kitchen fluticasone (FLONASE) 50 MCG/ACT nasal spray Place 1 spray into both nostrils daily.  Marland Kitchen glucose blood (FREESTYLE LITE) test strip USE AS DIRECTED THREE TIMES DAILY  . HYDROcodone-acetaminophen (NORCO/VICODIN) 5-325 MG tablet Take 1 tablet by mouth every 4 (four) hours as needed for moderate pain.  . hydrocortisone 2.5 % cream Apply 1 application topically 3 (three) times daily as needed (skin irritation (legs & arms)).   Marland Kitchen insulin aspart (NOVOLOG FLEXPEN) 100 UNIT/ML FlexPen  INJECT 6-8 UNITS UNDER THE SKIN THREE TIMES DAILY BEFORE MEALS. DX:E11.65 (Patient taking differently: Inject 6-8 Units into the skin 3 (three) times daily with meals. INJECT 6-8 UNITS UNDER THE SKIN THREE TIMES DAILY BEFORE MEALS. DX:E11.65)  . Insulin Pen Needle (PEN NEEDLES) 32G X 4 MM MISC 1 each by Does not apply route 3 (three) times daily. USE PEN NEEDLES TO INJECT INSULIN THREE TIMES DAILY.  Marland Kitchen ketoconazole (NIZORAL) 2 % shampoo Apply 1 application topically 2 (two) times a week.  . Lancets (FREESTYLE) lancets Use as instructed to check blood sugar 2 times per day dx code E11.65  . levothyroxine (SYNTHROID, LEVOTHROID) 50 MCG tablet Take 1 tablet (50 mcg total) by mouth daily. (Patient taking differently: Take 50 mcg by mouth daily before breakfast.)  . lidocaine (LIDODERM) 5 % Place 1 patch onto the skin daily as needed (back pain).   . memantine (NAMENDA) 10 MG tablet Take 1 tablet (10 mg total) by mouth 2 (two) times daily.  . midodrine (PROAMATINE) 10 MG tablet Take one pill prior to hemodialysis on MWF (Patient taking differently: Take 5 mg by mouth every Monday, Wednesday, and Friday. Take one pill prior to hemodialysis on MWF)  . Nutritional Supplements (FEEDING SUPPLEMENT, NEPRO CARB STEADY,) LIQD Take 237 mLs by mouth in the morning and at bedtime.   Marland Kitchen omeprazole (PRILOSEC) 40 MG capsule Take 1 capsule (40 mg total) by mouth daily.  . sevelamer carbonate (RENVELA) 800 MG tablet Take 1,600-2,400 mg by mouth See admin instructions. Take 2,400 mg by mouth three times a day with meals  and 1,600 mg with snacks on Sun/Tues/Thurs/Sat and 2,400 mg with breakfast and dinner and nothing with snacks on Mon/Wed/Fri  . simvastatin (ZOCOR) 20 MG tablet Take 20 mg by mouth at bedtime.  . Teriparatide, Recombinant, (FORTEO) 600 MCG/2.4ML SOPN Inject 20 mcg subcutaneously daily  . TRADJENTA 5 MG TABS tablet TAKE 1 TABLET DAILY (Patient taking differently: Take 5 mg by mouth daily.)   No  facility-administered encounter medications on file as of 03/01/2021.    Functional Status:  In your present state of health, do you have any difficulty performing the following activities: 01/26/2021 01/05/2021  Hearing? N -  Vision? N N  Difficulty concentrating or making decisions? [name]  Walking or climbing stairs? Y Y  Dressing or bathing? Y Y  Doing errands, shopping? - Y  Preparing Food and eating ? - Y  Using the Toilet? - Y  In the past six months, have you accidently leaked urine? - Y  Do you have problems with loss of bowel control? - Y  Managing your Medications? - Y  Managing your Finances? - Y  Housekeeping or managing your Housekeeping? - Y  Some recent data might be hidden    Fall/Depression Screening: Fall Risk  01/05/2021 01/03/2021 01/20/2019  Falls in the past year? _0 Comment - - -  Number falls in past yr: 1 0 0  Injury with Fall? 1 1 0  Risk for fall due to : History of fall(s);Impaired balance/gait;Impaired mobility;Medication side effect;Mental status change;Orthopedic patient - -  Follow up Falls evaluation completed;Education provided;Falls prevention discussed Falls evaluation completed Falls evaluation completed;Education provided   [facility] 2/9 Scores 01/05/2021 01/20/2019 10/09/2018 08/26/2018 08/29/2017 02/14/2015 02/14/2015  PHQ - 2 Score 6 0 2 2 0 0 0  PHQ- 9 Score 18 - - 14 - - -    Assessment: Approaching end of life  Care Plan  Goals Addressed            This Visit's Progress   . COMPLETED: Cope with Pain as evidenced by improved ability to move around by wife's report over the next 3 months.       Timeframe:  Long-Range Goal Priority:  High Start Date:      01/05/21                       Expected End Date:     04/21/21    Barriers: Knowledge                Follow Up Date 02/20/21   Notes: 01/05/21 Wife reports pt back pain keeps him from doing everything in his PT sessions. He takes 1000 mg APAP am and PM. Recommended increasing to tid max dose.  Also use topical diclofenac on a regular basis. Exercise on days that he does not have PT. 01/19/21 Mr. Magwood had an ER visit for SOB, hypglycemia, found new dx of AFIB. No PE. Started Eliquis. Applied a heart monitor to be worn for 2 weeks. Pt is still getting PT and his mobiilty is slowly improving. He takes APAP am and pm and this helps him get up and get moving. 02/02/21 Pt is coping with pain usually with APAP. He did have revision of his fistula and that is not causing him too much discomfort. They are using an alternate temporary subclavian access until the fistula is ready to use again. He is moving around but slowly. She encouraged safety and he has not had  any recent falls. 03/01/21 Pt now has Palliative Care servcies and they are monitoring his pain. Wife reports his leg pain is much better, however, he does still have some back pain but it is manageable.    . COMPLETED: Monitor and Manage My Blood Sugar-Diabetes Type 2 by wife's report of FBS range over the next 3 months.       Timeframe:  Long-Range Goal Priority:  Medium Start Date:    01/05/21                         Expected End Date:   04/21/29                 Follow Up Date 03/08/21   - check blood sugar at prescribed times - take the blood sugar meter to all doctor visits    Why is this important?    Checking your blood sugar at home helps to keep it from getting very high or very low.   Writing the results in a diary or log helps the doctor know how to care for you.   Your blood sugar log should have the time, date and the results.   Also, write down the amount of insulin or other medicine that you take.   Other information, like what you ate, exercise done and how you were feeling, will also be helpful.     Notes: 01/05/21 Wife reports FBS today was 89. Usually between 70-90. Last HgbA1C was 5.3. He is on trajenta and lantus. Wife to make endocrinology appt to discuss discontinuation of one of these. 01/19/21 Pt has ED visit with  hypoglycemia, SOB, new AFIB. Glucose was 51 first thing in the am. He is getting units regular insulin with regular glucose readings before bed. Today's FBS was 72, before supper reading was 124. Requested that wife try no hs insulin at hs and see how FBS are. Report this experiment to his endocrinologist scheduled for next week. Pt condition is definitely dwindling. Have introduced the idea of Palliative Care. 02/02/21 Pt glucose readings are allnormal without insulin and his FBS are running 60-79, high over last week was 105. Advised wife she can stop the frequent glucose checks except for his fasting, since he is not having any highs and we'd rather him be a bit high than too low. Advised that I will message Dr. Dwyane Dee of my given advice. 03/01/21 Did not go into his glucose control since he is now transitioned to palliative care.    . COMPLETED: Obtain Eye Exam-Diabetes Type 2 within the next 3 months per report.       Follow Up Date 03/08/21  - schedule appointment with eye doctor    Why is this important?    Eye check-ups are important when you have diabetes.   Vision loss can be prevented.    Notes: 01/05/21 Had an appt scheduled but then had fall and fx. Wife to reschedule. 4/28./22 Too much going on to think about eye exam. Will play it by ear. Pt condition in dwindling. 02/02/21 Advised that wife does not need to burden herself with these appts since her husband is edging towards the end of life. She says she does want to follow through at make an eye and foot appt and dentist appt. She plans on making these for June. 03/01/21 Wife reports he does have an eye appt scheduled in August.       Plan: I am closing pt  case as he is now under the services of [place].  Follow-up: Patient requests no follow-up at this time.   Eulah Pont. Myrtie Neither, MSN, [facility] Gerontological Nurse Practitioner [facility] Care Management [phone]

## 2021-03-02 ENCOUNTER — Telehealth: Payer: Self-pay | Admitting: Pharmacy Technician

## 2021-03-02 DIAGNOSIS — I120 Hypertensive chronic kidney disease with stage 5 chronic kidney disease or end stage renal disease: Secondary | ICD-10-CM | POA: Diagnosis not present

## 2021-03-02 DIAGNOSIS — S72144D Nondisplaced intertrochanteric fracture of right femur, subsequent encounter for closed fracture with routine healing: Secondary | ICD-10-CM | POA: Diagnosis not present

## 2021-03-02 DIAGNOSIS — D631 Anemia in chronic kidney disease: Secondary | ICD-10-CM | POA: Diagnosis not present

## 2021-03-02 DIAGNOSIS — N186 End stage renal disease: Secondary | ICD-10-CM | POA: Diagnosis not present

## 2021-03-02 DIAGNOSIS — F0390 Unspecified dementia without behavioral disturbance: Secondary | ICD-10-CM | POA: Diagnosis not present

## 2021-03-02 DIAGNOSIS — E1122 Type 2 diabetes mellitus with diabetic chronic kidney disease: Secondary | ICD-10-CM | POA: Diagnosis not present

## 2021-03-02 NOTE — Telephone Encounter (Addendum)
Patient Advocate Encounter   Received notification from [place] [phone] that prior authorization for FORTEO is required.   PA submitted on 03/02/2021 FAXED as requested to [phone] Status is [place] 03/06/2023  [facility] will continue to follow.   Venida Jarvis. Nadara Mustard, CPhT Patient Advocate [facility] Phone: [phone] Fax:  [phone]

## 2021-03-03 ENCOUNTER — Telehealth: Payer: Self-pay

## 2021-03-03 DIAGNOSIS — N186 End stage renal disease: Secondary | ICD-10-CM | POA: Diagnosis not present

## 2021-03-03 DIAGNOSIS — N2581 Secondary hyperparathyroidism of renal origin: Secondary | ICD-10-CM | POA: Diagnosis not present

## 2021-03-03 DIAGNOSIS — Z992 Dependence on renal dialysis: Secondary | ICD-10-CM | POA: Diagnosis not present

## 2021-03-03 DIAGNOSIS — E876 Hypokalemia: Secondary | ICD-10-CM | POA: Diagnosis not present

## 2021-03-03 DIAGNOSIS — D509 Iron deficiency anemia, unspecified: Secondary | ICD-10-CM | POA: Diagnosis not present

## 2021-03-03 NOTE — Telephone Encounter (Signed)
03/03/21 @1 :35PM: Palliative care SW outreached patients spouse, [name], to address needs.  Spouse shared that her only/main concern at this time was obtaining caregiver assistance on the weekends to assist with bathing and dressing. SW advised spouse to outreach SW at the [place] to request a re-assessment of aid and attendance hours, as patient is already receiving these services, in hopes that they may be ab;e to offer and approve more hours of care. SW also advised spouse to share with [facility] SW the desire to possibly change home care agencies at current agency does to have staff available on the weekend.   Spouse shared that she would outreach [place] also provided spouse a private duty aide contact for her to outreach should the [place] be unable to apporive additional hours.

## 2021-03-06 DIAGNOSIS — N186 End stage renal disease: Secondary | ICD-10-CM | POA: Diagnosis not present

## 2021-03-06 DIAGNOSIS — D509 Iron deficiency anemia, unspecified: Secondary | ICD-10-CM | POA: Diagnosis not present

## 2021-03-06 DIAGNOSIS — Z992 Dependence on renal dialysis: Secondary | ICD-10-CM | POA: Diagnosis not present

## 2021-03-06 DIAGNOSIS — N2581 Secondary hyperparathyroidism of renal origin: Secondary | ICD-10-CM | POA: Diagnosis not present

## 2021-03-06 DIAGNOSIS — E876 Hypokalemia: Secondary | ICD-10-CM | POA: Diagnosis not present

## 2021-03-07 DIAGNOSIS — I120 Hypertensive chronic kidney disease with stage 5 chronic kidney disease or end stage renal disease: Secondary | ICD-10-CM | POA: Diagnosis not present

## 2021-03-07 DIAGNOSIS — D631 Anemia in chronic kidney disease: Secondary | ICD-10-CM | POA: Diagnosis not present

## 2021-03-07 DIAGNOSIS — N186 End stage renal disease: Secondary | ICD-10-CM | POA: Diagnosis not present

## 2021-03-07 DIAGNOSIS — F0390 Unspecified dementia without behavioral disturbance: Secondary | ICD-10-CM | POA: Diagnosis not present

## 2021-03-07 DIAGNOSIS — S72144D Nondisplaced intertrochanteric fracture of right femur, subsequent encounter for closed fracture with routine healing: Secondary | ICD-10-CM | POA: Diagnosis not present

## 2021-03-07 DIAGNOSIS — E1122 Type 2 diabetes mellitus with diabetic chronic kidney disease: Secondary | ICD-10-CM | POA: Diagnosis not present

## 2021-03-08 DIAGNOSIS — D509 Iron deficiency anemia, unspecified: Secondary | ICD-10-CM | POA: Diagnosis not present

## 2021-03-08 DIAGNOSIS — Z992 Dependence on renal dialysis: Secondary | ICD-10-CM | POA: Diagnosis not present

## 2021-03-08 DIAGNOSIS — E876 Hypokalemia: Secondary | ICD-10-CM | POA: Diagnosis not present

## 2021-03-08 DIAGNOSIS — N186 End stage renal disease: Secondary | ICD-10-CM | POA: Diagnosis not present

## 2021-03-08 DIAGNOSIS — N2581 Secondary hyperparathyroidism of renal origin: Secondary | ICD-10-CM | POA: Diagnosis not present

## 2021-03-09 ENCOUNTER — Telehealth: Payer: Self-pay | Admitting: Internal Medicine

## 2021-03-09 DIAGNOSIS — E1122 Type 2 diabetes mellitus with diabetic chronic kidney disease: Secondary | ICD-10-CM | POA: Diagnosis not present

## 2021-03-09 DIAGNOSIS — F0390 Unspecified dementia without behavioral disturbance: Secondary | ICD-10-CM | POA: Diagnosis not present

## 2021-03-09 DIAGNOSIS — N186 End stage renal disease: Secondary | ICD-10-CM | POA: Diagnosis not present

## 2021-03-09 DIAGNOSIS — L03115 Cellulitis of right lower limb: Secondary | ICD-10-CM | POA: Diagnosis not present

## 2021-03-09 DIAGNOSIS — D631 Anemia in chronic kidney disease: Secondary | ICD-10-CM | POA: Diagnosis not present

## 2021-03-09 DIAGNOSIS — S72144D Nondisplaced intertrochanteric fracture of right femur, subsequent encounter for closed fracture with routine healing: Secondary | ICD-10-CM | POA: Diagnosis not present

## 2021-03-09 DIAGNOSIS — I120 Hypertensive chronic kidney disease with stage 5 chronic kidney disease or end stage renal disease: Secondary | ICD-10-CM | POA: Diagnosis not present

## 2021-03-09 NOTE — Telephone Encounter (Signed)
Phone call from Physical Therapist that patient has red area on lower leg that is tender to touch and new from previous visit. He has dementia. Does not recall any injury. Advised visit at urgent care today or ED. MJB, MD

## 2021-03-10 DIAGNOSIS — D509 Iron deficiency anemia, unspecified: Secondary | ICD-10-CM | POA: Diagnosis not present

## 2021-03-10 DIAGNOSIS — Z992 Dependence on renal dialysis: Secondary | ICD-10-CM | POA: Diagnosis not present

## 2021-03-10 DIAGNOSIS — E876 Hypokalemia: Secondary | ICD-10-CM | POA: Diagnosis not present

## 2021-03-10 DIAGNOSIS — N186 End stage renal disease: Secondary | ICD-10-CM | POA: Diagnosis not present

## 2021-03-10 DIAGNOSIS — N2581 Secondary hyperparathyroidism of renal origin: Secondary | ICD-10-CM | POA: Diagnosis not present

## 2021-03-13 DIAGNOSIS — D509 Iron deficiency anemia, unspecified: Secondary | ICD-10-CM | POA: Diagnosis not present

## 2021-03-13 DIAGNOSIS — N186 End stage renal disease: Secondary | ICD-10-CM | POA: Diagnosis not present

## 2021-03-13 DIAGNOSIS — Z992 Dependence on renal dialysis: Secondary | ICD-10-CM | POA: Diagnosis not present

## 2021-03-13 DIAGNOSIS — E876 Hypokalemia: Secondary | ICD-10-CM | POA: Diagnosis not present

## 2021-03-13 DIAGNOSIS — N2581 Secondary hyperparathyroidism of renal origin: Secondary | ICD-10-CM | POA: Diagnosis not present

## 2021-03-14 ENCOUNTER — Telehealth: Payer: Self-pay | Admitting: Endocrinology

## 2021-03-14 DIAGNOSIS — F0390 Unspecified dementia without behavioral disturbance: Secondary | ICD-10-CM | POA: Diagnosis not present

## 2021-03-14 DIAGNOSIS — E1122 Type 2 diabetes mellitus with diabetic chronic kidney disease: Secondary | ICD-10-CM | POA: Diagnosis not present

## 2021-03-14 DIAGNOSIS — S72144D Nondisplaced intertrochanteric fracture of right femur, subsequent encounter for closed fracture with routine healing: Secondary | ICD-10-CM | POA: Diagnosis not present

## 2021-03-14 DIAGNOSIS — D631 Anemia in chronic kidney disease: Secondary | ICD-10-CM | POA: Diagnosis not present

## 2021-03-14 DIAGNOSIS — N186 End stage renal disease: Secondary | ICD-10-CM | POA: Diagnosis not present

## 2021-03-14 DIAGNOSIS — I120 Hypertensive chronic kidney disease with stage 5 chronic kidney disease or end stage renal disease: Secondary | ICD-10-CM | POA: Diagnosis not present

## 2021-03-14 NOTE — Telephone Encounter (Signed)
Patient's wife came in dropped off Patient Assistance Paperwork for [employer].  Placed in Dr Jodelle Green box at front desk

## 2021-03-15 DIAGNOSIS — N2581 Secondary hyperparathyroidism of renal origin: Secondary | ICD-10-CM | POA: Diagnosis not present

## 2021-03-15 DIAGNOSIS — D509 Iron deficiency anemia, unspecified: Secondary | ICD-10-CM | POA: Diagnosis not present

## 2021-03-15 DIAGNOSIS — E876 Hypokalemia: Secondary | ICD-10-CM | POA: Diagnosis not present

## 2021-03-15 DIAGNOSIS — Z992 Dependence on renal dialysis: Secondary | ICD-10-CM | POA: Diagnosis not present

## 2021-03-15 DIAGNOSIS — N186 End stage renal disease: Secondary | ICD-10-CM | POA: Diagnosis not present

## 2021-03-15 NOTE — Telephone Encounter (Signed)
Received paperwork. Provider with sign and I will fax paperwork

## 2021-03-15 NOTE — Telephone Encounter (Signed)
[name] states that, a medication refill needs to be sent in for the [facility]. Sent to [facility]

## 2021-03-17 DIAGNOSIS — Z992 Dependence on renal dialysis: Secondary | ICD-10-CM | POA: Diagnosis not present

## 2021-03-17 DIAGNOSIS — N2581 Secondary hyperparathyroidism of renal origin: Secondary | ICD-10-CM | POA: Diagnosis not present

## 2021-03-17 DIAGNOSIS — E876 Hypokalemia: Secondary | ICD-10-CM | POA: Diagnosis not present

## 2021-03-17 DIAGNOSIS — N186 End stage renal disease: Secondary | ICD-10-CM | POA: Diagnosis not present

## 2021-03-17 DIAGNOSIS — D509 Iron deficiency anemia, unspecified: Secondary | ICD-10-CM | POA: Diagnosis not present

## 2021-03-17 NOTE — Telephone Encounter (Signed)
Patient assistance form was faxed.

## 2021-03-20 DIAGNOSIS — D509 Iron deficiency anemia, unspecified: Secondary | ICD-10-CM | POA: Diagnosis not present

## 2021-03-20 DIAGNOSIS — N2581 Secondary hyperparathyroidism of renal origin: Secondary | ICD-10-CM | POA: Diagnosis not present

## 2021-03-20 DIAGNOSIS — Z992 Dependence on renal dialysis: Secondary | ICD-10-CM | POA: Diagnosis not present

## 2021-03-20 DIAGNOSIS — N186 End stage renal disease: Secondary | ICD-10-CM | POA: Diagnosis not present

## 2021-03-20 DIAGNOSIS — E876 Hypokalemia: Secondary | ICD-10-CM | POA: Diagnosis not present

## 2021-03-21 DIAGNOSIS — F0390 Unspecified dementia without behavioral disturbance: Secondary | ICD-10-CM | POA: Diagnosis not present

## 2021-03-21 DIAGNOSIS — S72144D Nondisplaced intertrochanteric fracture of right femur, subsequent encounter for closed fracture with routine healing: Secondary | ICD-10-CM | POA: Diagnosis not present

## 2021-03-21 DIAGNOSIS — N186 End stage renal disease: Secondary | ICD-10-CM | POA: Diagnosis not present

## 2021-03-21 DIAGNOSIS — D631 Anemia in chronic kidney disease: Secondary | ICD-10-CM | POA: Diagnosis not present

## 2021-03-21 DIAGNOSIS — I120 Hypertensive chronic kidney disease with stage 5 chronic kidney disease or end stage renal disease: Secondary | ICD-10-CM | POA: Diagnosis not present

## 2021-03-21 DIAGNOSIS — E1122 Type 2 diabetes mellitus with diabetic chronic kidney disease: Secondary | ICD-10-CM | POA: Diagnosis not present

## 2021-03-22 DIAGNOSIS — Z992 Dependence on renal dialysis: Secondary | ICD-10-CM | POA: Diagnosis not present

## 2021-03-22 DIAGNOSIS — N2581 Secondary hyperparathyroidism of renal origin: Secondary | ICD-10-CM | POA: Diagnosis not present

## 2021-03-22 DIAGNOSIS — E876 Hypokalemia: Secondary | ICD-10-CM | POA: Diagnosis not present

## 2021-03-22 DIAGNOSIS — N186 End stage renal disease: Secondary | ICD-10-CM | POA: Diagnosis not present

## 2021-03-22 DIAGNOSIS — S72141A Displaced intertrochanteric fracture of right femur, initial encounter for closed fracture: Secondary | ICD-10-CM | POA: Diagnosis not present

## 2021-03-22 DIAGNOSIS — D509 Iron deficiency anemia, unspecified: Secondary | ICD-10-CM | POA: Diagnosis not present

## 2021-03-23 ENCOUNTER — Ambulatory Visit: Payer: Medicare Other | Admitting: Gastroenterology

## 2021-03-23 NOTE — Telephone Encounter (Signed)
Received fax from [place] [facility] and patient assistance was denied.

## 2021-03-24 DIAGNOSIS — D631 Anemia in chronic kidney disease: Secondary | ICD-10-CM | POA: Diagnosis not present

## 2021-03-24 DIAGNOSIS — N186 End stage renal disease: Secondary | ICD-10-CM | POA: Diagnosis not present

## 2021-03-24 DIAGNOSIS — E1122 Type 2 diabetes mellitus with diabetic chronic kidney disease: Secondary | ICD-10-CM | POA: Diagnosis not present

## 2021-03-24 DIAGNOSIS — E876 Hypokalemia: Secondary | ICD-10-CM | POA: Diagnosis not present

## 2021-03-24 DIAGNOSIS — N2581 Secondary hyperparathyroidism of renal origin: Secondary | ICD-10-CM | POA: Diagnosis not present

## 2021-03-24 DIAGNOSIS — E1129 Type 2 diabetes mellitus with other diabetic kidney complication: Secondary | ICD-10-CM | POA: Diagnosis not present

## 2021-03-24 DIAGNOSIS — Z992 Dependence on renal dialysis: Secondary | ICD-10-CM | POA: Diagnosis not present

## 2021-03-24 NOTE — Telephone Encounter (Signed)
[name] called back stating they got a letter from [place] that they had approved the medication and she thinks its been taken care of. She asked if we could just call in the [place]

## 2021-03-25 MED ORDER — FORTEO 600 MCG/2.4ML ~~LOC~~ SOPN: PEN_INJECTOR | SUBCUTANEOUS | 3 refills | Status: DC

## 2021-03-25 NOTE — Addendum Note (Signed)
Addended by: Cinda Quest on: 03/25/2021 06:29 PM   Modules accepted: Orders

## 2021-03-27 DIAGNOSIS — N186 End stage renal disease: Secondary | ICD-10-CM | POA: Diagnosis not present

## 2021-03-27 DIAGNOSIS — N2581 Secondary hyperparathyroidism of renal origin: Secondary | ICD-10-CM | POA: Diagnosis not present

## 2021-03-27 DIAGNOSIS — R1312 Dysphagia, oropharyngeal phase: Secondary | ICD-10-CM | POA: Diagnosis not present

## 2021-03-27 DIAGNOSIS — Z794 Long term (current) use of insulin: Secondary | ICD-10-CM | POA: Diagnosis not present

## 2021-03-27 DIAGNOSIS — Z992 Dependence on renal dialysis: Secondary | ICD-10-CM | POA: Diagnosis not present

## 2021-03-27 DIAGNOSIS — Z7984 Long term (current) use of oral hypoglycemic drugs: Secondary | ICD-10-CM | POA: Diagnosis not present

## 2021-03-27 DIAGNOSIS — I739 Peripheral vascular disease, unspecified: Secondary | ICD-10-CM | POA: Diagnosis not present

## 2021-03-27 DIAGNOSIS — E1122 Type 2 diabetes mellitus with diabetic chronic kidney disease: Secondary | ICD-10-CM | POA: Diagnosis not present

## 2021-03-27 DIAGNOSIS — I4891 Unspecified atrial fibrillation: Secondary | ICD-10-CM | POA: Diagnosis not present

## 2021-03-27 DIAGNOSIS — D631 Anemia in chronic kidney disease: Secondary | ICD-10-CM | POA: Diagnosis not present

## 2021-03-27 DIAGNOSIS — F0390 Unspecified dementia without behavioral disturbance: Secondary | ICD-10-CM | POA: Diagnosis not present

## 2021-03-27 DIAGNOSIS — E876 Hypokalemia: Secondary | ICD-10-CM | POA: Diagnosis not present

## 2021-03-27 DIAGNOSIS — Z7982 Long term (current) use of aspirin: Secondary | ICD-10-CM | POA: Diagnosis not present

## 2021-03-27 DIAGNOSIS — S72144D Nondisplaced intertrochanteric fracture of right femur, subsequent encounter for closed fracture with routine healing: Secondary | ICD-10-CM | POA: Diagnosis not present

## 2021-03-27 DIAGNOSIS — I120 Hypertensive chronic kidney disease with stage 5 chronic kidney disease or end stage renal disease: Secondary | ICD-10-CM | POA: Diagnosis not present

## 2021-03-27 DIAGNOSIS — E1129 Type 2 diabetes mellitus with other diabetic kidney complication: Secondary | ICD-10-CM | POA: Diagnosis not present

## 2021-03-29 DIAGNOSIS — N186 End stage renal disease: Secondary | ICD-10-CM | POA: Diagnosis not present

## 2021-03-29 DIAGNOSIS — N2581 Secondary hyperparathyroidism of renal origin: Secondary | ICD-10-CM | POA: Diagnosis not present

## 2021-03-29 DIAGNOSIS — D631 Anemia in chronic kidney disease: Secondary | ICD-10-CM | POA: Diagnosis not present

## 2021-03-29 DIAGNOSIS — Z992 Dependence on renal dialysis: Secondary | ICD-10-CM | POA: Diagnosis not present

## 2021-03-29 DIAGNOSIS — E1129 Type 2 diabetes mellitus with other diabetic kidney complication: Secondary | ICD-10-CM | POA: Diagnosis not present

## 2021-03-29 DIAGNOSIS — E876 Hypokalemia: Secondary | ICD-10-CM | POA: Diagnosis not present

## 2021-03-29 DIAGNOSIS — Z20822 Contact with and (suspected) exposure to covid-19: Secondary | ICD-10-CM | POA: Diagnosis not present

## 2021-03-30 DIAGNOSIS — F0390 Unspecified dementia without behavioral disturbance: Secondary | ICD-10-CM | POA: Diagnosis not present

## 2021-03-30 DIAGNOSIS — D631 Anemia in chronic kidney disease: Secondary | ICD-10-CM | POA: Diagnosis not present

## 2021-03-30 DIAGNOSIS — I120 Hypertensive chronic kidney disease with stage 5 chronic kidney disease or end stage renal disease: Secondary | ICD-10-CM | POA: Diagnosis not present

## 2021-03-30 DIAGNOSIS — S72144D Nondisplaced intertrochanteric fracture of right femur, subsequent encounter for closed fracture with routine healing: Secondary | ICD-10-CM | POA: Diagnosis not present

## 2021-03-30 DIAGNOSIS — E1122 Type 2 diabetes mellitus with diabetic chronic kidney disease: Secondary | ICD-10-CM | POA: Diagnosis not present

## 2021-03-30 DIAGNOSIS — N186 End stage renal disease: Secondary | ICD-10-CM | POA: Diagnosis not present

## 2021-03-31 DIAGNOSIS — Z992 Dependence on renal dialysis: Secondary | ICD-10-CM | POA: Diagnosis not present

## 2021-03-31 DIAGNOSIS — D631 Anemia in chronic kidney disease: Secondary | ICD-10-CM | POA: Diagnosis not present

## 2021-03-31 DIAGNOSIS — N2581 Secondary hyperparathyroidism of renal origin: Secondary | ICD-10-CM | POA: Diagnosis not present

## 2021-03-31 DIAGNOSIS — E1129 Type 2 diabetes mellitus with other diabetic kidney complication: Secondary | ICD-10-CM | POA: Diagnosis not present

## 2021-03-31 DIAGNOSIS — E876 Hypokalemia: Secondary | ICD-10-CM | POA: Diagnosis not present

## 2021-03-31 DIAGNOSIS — N186 End stage renal disease: Secondary | ICD-10-CM | POA: Diagnosis not present

## 2021-04-03 DIAGNOSIS — Z992 Dependence on renal dialysis: Secondary | ICD-10-CM | POA: Diagnosis not present

## 2021-04-03 DIAGNOSIS — E1129 Type 2 diabetes mellitus with other diabetic kidney complication: Secondary | ICD-10-CM | POA: Diagnosis not present

## 2021-04-03 DIAGNOSIS — E876 Hypokalemia: Secondary | ICD-10-CM | POA: Diagnosis not present

## 2021-04-03 DIAGNOSIS — N2581 Secondary hyperparathyroidism of renal origin: Secondary | ICD-10-CM | POA: Diagnosis not present

## 2021-04-03 DIAGNOSIS — D631 Anemia in chronic kidney disease: Secondary | ICD-10-CM | POA: Diagnosis not present

## 2021-04-03 DIAGNOSIS — N186 End stage renal disease: Secondary | ICD-10-CM | POA: Diagnosis not present

## 2021-04-04 ENCOUNTER — Other Ambulatory Visit: Payer: Self-pay

## 2021-04-04 ENCOUNTER — Encounter: Payer: Self-pay | Admitting: Nurse Practitioner

## 2021-04-04 ENCOUNTER — Other Ambulatory Visit: Payer: Medicare Other | Admitting: Nurse Practitioner

## 2021-04-04 DIAGNOSIS — K5900 Constipation, unspecified: Secondary | ICD-10-CM | POA: Diagnosis not present

## 2021-04-04 DIAGNOSIS — Z515 Encounter for palliative care: Secondary | ICD-10-CM

## 2021-04-04 DIAGNOSIS — R630 Anorexia: Secondary | ICD-10-CM | POA: Diagnosis not present

## 2021-04-04 NOTE — Progress Notes (Signed)
[facility] Consult Note Telephone: [phone]  Fax: [phone]    Date of encounter: 04/04/21 PATIENT NAME: [name] [address] [place] [address]   [phone] (home)  DOB: 13-Jun-1941 MRN: [id number] PRIMARY CARE PROVIDER:    Elby Showers, MD,  [address] [address] [phone]  RESPONSIBLE PARTY:    Contact Information     Name Relation Home Work Mobile   Puig,Shirley Spouse [phone]  [phone]   Ignasiak,Crystal Daughter [phone]  [phone]   Summers,Tammy Niece [phone]  [phone]      I met face to face with patient and family in home. Palliative Care was asked to follow this patient by consultation request of  Baxley, Cresenciano Lick, MD to address advance care planning and complex medical decision making. This is a follow up visit.  ASSESSMENT AND PLAN / RECOMMENDATIONS:   Advance Care Planning/Goals of Care: Goals include to maximize quality of life and symptom management. Our advance care planning conversation included a discussion about:    The value and importance of advance care planning  Experiences with loved ones who have been seriously ill or have died  Exploration of personal, cultural or spiritual beliefs that might influence medical decisions  Exploration of goals of care in the event of a sudden injury or illness  Identification and preparation of a healthcare agent  Review and updating or creation of an  advance directive document . Decision not to resuscitate or to de-escalate disease focused treatments due to poor prognosis. CODE STATUS: DNR  Symptom Management/Plan: 1. ACP; Discussed at length, DNR, scenarios of CPR, Mr. Santilli endorses he would not want that happen to him, Mrs Daniello in agreement. DNR goldenrod form completed, placed in vynca; we reviewed MOST form, wishes are for limited interventions, IVF, antibiotics and feeding tube for defined  period of time. Mrs Obar endorses the feeding tube they will likely not do that but wants to have the option for further discussion should it present to be an option in the future. MOST form completed, placed in Vynca; Continue to treat what is treatable.   2. Anorexia; irritability; secondary to dementia; We talked about nutrition. Ms. Voiles endorses they tried remeron for a week, and Mr. Sear became very irritable so it was stopped with symptoms resolving. We talked about supplements. We talked about disease progression of dementia in the setting of ESRD. Discussed red areas buttock, with declined nutrition. Sent in [place] cream  Rx: Fanny Cream; 3RF called into [employer].  Called [facility] [place] per Mrs Egger request for [employer] cream but they were not able to order recommended Desitin in place. Called Mrs. [name] and updated.   3. Constipation. We talked about bowel patterns, nutrition, continue to use MOM, has been effective   4. Goals of Care: Goals include to maximize quality of life and symptom management. Our advance care planning conversation included a discussion about:    The value and importance of advance care planning  Exploration of personal, cultural or spiritual beliefs that might influence medical decisions  Exploration of goals of care in the event of a sudden injury or illness  Identification and preparation of a healthcare agent  Review and updating or creation of an advance directive document.   5. Palliative care encounter; Palliative care encounter; Palliative medicine team will continue to support patient, patient's family, and medical team. Visit consisted of counseling and education dealing with the complex and emotionally intense  issues of symptom management and palliative care in the setting of serious and potentially life-threatening illness  Follow up Palliative Care Visit: Palliative care will continue to follow for complex medical decision making,  advance care planning, and clarification of goals. Return 8 weeks or prn.  I spent 60 minutes providing this consultation. More than 50% of the time in this consultation was spent in counseling and care coordination.  PPS: 50%  Chief Complaint: Follow up Palliative consult for complex medical decision making  HISTORY OF PRESENT ILLNESS:  [name] is a 80 y.o. year old male  with multiple medical problems including ESRD requiring HD, Alzheimer's disease, anemia, CAD, PVD, DM, HTN, HLD, sleep apnea, hypothyroidism, gerd, gout, arthritis, h/o hiatal hernia, h/o diverticulitis, ED, seasonal allergies, cataract, h/o burr holes, AV fistula placement, breast sgy left - granulomatous mastitis, aortobifemoral bypass, angioplasty. I called Mrs Grieger to confirm [facility] f/u visit and covid screening negative. I visited Mr and Mrs Purdum. Mrs Parke opened the door allowing me to talk with her separately prior to visit with Mr and Mrs Lasch. We talked about how Mr Gapinski has been doing. Mrs Juhasz endorses some things better, some more difficult. We talked about Mr. Casasola continuing to go to HD by transportation allowing her time for self care. We talked about Mr. Cromley coming off the dialysis machine early sometimes 10 minutes sometimes 20 minutes. Mrs. Cline endorses could be overall tired of being on the machine, bored. Mrs. Pomplun endorses she was going to call HD center to further discuss with them about taking him off too early because then he develops symptoms of mild dyspnea. We talked about his functional level. Mr Delo is ambulatory, he is able to help with bathing, dressing. Mr Klaiber is able to feed himself with appetite continuing to decline. Mrs. Pilar endorses it has been so hard to get him to eat with some weight loss ongoing. Mrs Kruzel endorses the remeron prescribed at last [facility] visit caused Mr. Restivo to be more irritable. Mrs. Eichelberger continued for a week but symptoms did not resolve so discontinued. We talked about  supplements. Mrs. Dames endorses he continues to have red areas buttock, one place sacrum was open but has now healed. Mrs. [name] requested Fannie Cream and Gearhart Butt cream. Pharmacy information provided. We talked about Palliative RN making a visit in about 3 to 4 weeks for ongoing monitoring areas to see if cream improved, nutrition. Mrs Kataoka in agreement. I visited Mr. Bidinger, he was sitting in his recliner in his room with Mrs. [name]. We talked about how he has been feeling. Mr Spahr endorses he has been doing well. We talked about symptoms of pain. Mr Dorko endorses he was having some uncomfortable feeling in the left side of his abdomen but also has not had a BM in 2 days. Assessment completed. We talked about MOM and trying as it appears constipation. We talked at length about nutrition. We talked about Mr. Norwood not having an appetite, food does not taste the same since being on HD. Mr Cannedy talked about foods he likes. We talked about trying to eat more routine. Mr. Slocumb endorses he will try though with dementia, limited. We talked about mobility. Mr Rise endorses he is tired after dialysis. We talked about Mr. Mah taking transportation to HD independently. Praised Mr. Montellano for going by himself. Mr. Cozzolino talked about his career in the [employer], he travelled the world. Mr. Huaracha talked about concerns he has  to make sure Mrs Hoselton is taken care for. Mr Hyneman talked about their trips and how much they like to gamble. Mr Stillings endorses Mrs. Mittleman likes to play the slot machines. Mrs. Paver laughed. We talked about quality of life. We talked about how HD impacts his daily life with cognitive impairment. We talked about sleeping patterns. Mr Lapoint endorses he normally stays up much later than Mrs. [name]. Mrs Shimabukuro endorses she goes to bed with Mr Clouse in the recliner watching TV. Mr Wirtanen endorses he likes to watch crime shows, mysteries, science. Mr. Markos endorses he stays up late watching TV, then goes to sleep, wakes up  around 6am and not able to go back to sleep. Mrs. Harvill endorses when she wakes up and peaks in on Mr. Buscemi he is sleeping. Mr. Rieger endorses he has preferred to stay up late at night. We talked about medical goals of care. Discussed at length, DNR, scenarios of CPR, Mr. Rio endorses he would not want that happen to him, Mrs Monica in agreement. DNR goldenrod form completed, placed in vynca; we reviewed MOST form, wishes are for limited interventions, IVF, antibiotics and feeding tube for defined period of time. Mrs Slagter endorses the feeding tube they will likely not do that but wants to have the option for further discussion should it present to be an option in the future. MOST form completed, placed in Vynca; Continue to treat what is treatable. Mrs. Brune endorses they have a time share condo in [place] and are in the process of planning a vacation. Mrs. Hase endorses she tries to plan outings for them. Mrs. [name] endorses Mr. Mclees has been dialyses in [facility] previously and she is waiting to hear when they have availability. We talked about purpose of PC in poc. We talked about f/u PC visit scheduled. Therapeutic listening and emotional support provided. Questions answered. Will f/u 8 weeks and have PC RN see in 3 to 4 weeks. Mrs. [name] in agreement  History obtained from review of EMR, discussion with Mrs and Mr. [name].  I reviewed available labs, medications, imaging, studies and related documents from the EMR.  Records reviewed and summarized above.   ROS Full 14 system review of systems performed and negative with exception of: as per HPI.   Physical Exam: Constitutional: NAD General: frail appearing, thin, pleasant male ENMT: oral mucous membranes moist CV: S1S2, RRR, no LE edema Pulmonary: LCTA, no increased work of breathing, no cough, room air Abdomen: normo-active BS + 4 quadrants, soft and non tender MSK: ambulatory with walker; muscle wasting Skin: warm and dry Neuro:  +  generalized weakness,  +cognitive impairment Psych: non-anxious affect, A and O x 2 Questions and concerns were addressed. The patient/family was encouraged to call with questions and/or concerns. My business card was provided. Provided general support and encouragement, no other unmet needs identified   Thank you for the opportunity to participate in the care of Mr. Bunda.  The palliative care team will continue to follow. Please call our office at [phone] if we can be of additional assistance.   This chart was dictated using voice recognition software.  Despite best efforts to proofread,  errors can occur which can change the documentation meaning.   Belva Koziel Z Kendle Turbin, NP   COVID-19 PATIENT SCREENING TOOL Asked and negative response unless otherwise noted:   Have you had symptoms of covid, tested positive or been in contact with someone with symptoms/positive test in the past 5-10  days? NO

## 2021-04-05 ENCOUNTER — Telehealth: Payer: Self-pay

## 2021-04-05 DIAGNOSIS — N186 End stage renal disease: Secondary | ICD-10-CM | POA: Diagnosis not present

## 2021-04-05 DIAGNOSIS — E876 Hypokalemia: Secondary | ICD-10-CM | POA: Diagnosis not present

## 2021-04-05 DIAGNOSIS — D631 Anemia in chronic kidney disease: Secondary | ICD-10-CM | POA: Diagnosis not present

## 2021-04-05 DIAGNOSIS — N2581 Secondary hyperparathyroidism of renal origin: Secondary | ICD-10-CM | POA: Diagnosis not present

## 2021-04-05 DIAGNOSIS — Z992 Dependence on renal dialysis: Secondary | ICD-10-CM | POA: Diagnosis not present

## 2021-04-05 DIAGNOSIS — E1129 Type 2 diabetes mellitus with other diabetic kidney complication: Secondary | ICD-10-CM | POA: Diagnosis not present

## 2021-04-05 NOTE — Telephone Encounter (Signed)
1041 am.  Request received from [place], NP to schedule a home visit with patient in the next 3-4 weeks for follow up on symptom management.    Spoke with wife who is agreeable to a visit but this will need to be the 3 rd week of August.  Patient and spouse are planning a beach trip for August 2-9 th.  Visit is scheduled for August 16 th at 1 pm.

## 2021-04-06 DIAGNOSIS — N186 End stage renal disease: Secondary | ICD-10-CM | POA: Diagnosis not present

## 2021-04-06 DIAGNOSIS — S72144D Nondisplaced intertrochanteric fracture of right femur, subsequent encounter for closed fracture with routine healing: Secondary | ICD-10-CM | POA: Diagnosis not present

## 2021-04-06 DIAGNOSIS — E1122 Type 2 diabetes mellitus with diabetic chronic kidney disease: Secondary | ICD-10-CM | POA: Diagnosis not present

## 2021-04-06 DIAGNOSIS — D631 Anemia in chronic kidney disease: Secondary | ICD-10-CM | POA: Diagnosis not present

## 2021-04-06 DIAGNOSIS — F0390 Unspecified dementia without behavioral disturbance: Secondary | ICD-10-CM | POA: Diagnosis not present

## 2021-04-06 DIAGNOSIS — I120 Hypertensive chronic kidney disease with stage 5 chronic kidney disease or end stage renal disease: Secondary | ICD-10-CM | POA: Diagnosis not present

## 2021-04-07 DIAGNOSIS — E1129 Type 2 diabetes mellitus with other diabetic kidney complication: Secondary | ICD-10-CM | POA: Diagnosis not present

## 2021-04-07 DIAGNOSIS — E876 Hypokalemia: Secondary | ICD-10-CM | POA: Diagnosis not present

## 2021-04-07 DIAGNOSIS — N186 End stage renal disease: Secondary | ICD-10-CM | POA: Diagnosis not present

## 2021-04-07 DIAGNOSIS — D631 Anemia in chronic kidney disease: Secondary | ICD-10-CM | POA: Diagnosis not present

## 2021-04-07 DIAGNOSIS — N2581 Secondary hyperparathyroidism of renal origin: Secondary | ICD-10-CM | POA: Diagnosis not present

## 2021-04-07 DIAGNOSIS — Z992 Dependence on renal dialysis: Secondary | ICD-10-CM | POA: Diagnosis not present

## 2021-04-10 ENCOUNTER — Telehealth: Payer: Self-pay | Admitting: Internal Medicine

## 2021-04-10 DIAGNOSIS — E876 Hypokalemia: Secondary | ICD-10-CM | POA: Diagnosis not present

## 2021-04-10 DIAGNOSIS — N186 End stage renal disease: Secondary | ICD-10-CM | POA: Diagnosis not present

## 2021-04-10 DIAGNOSIS — N2581 Secondary hyperparathyroidism of renal origin: Secondary | ICD-10-CM | POA: Diagnosis not present

## 2021-04-10 DIAGNOSIS — Z992 Dependence on renal dialysis: Secondary | ICD-10-CM | POA: Diagnosis not present

## 2021-04-10 DIAGNOSIS — E1129 Type 2 diabetes mellitus with other diabetic kidney complication: Secondary | ICD-10-CM | POA: Diagnosis not present

## 2021-04-10 DIAGNOSIS — D631 Anemia in chronic kidney disease: Secondary | ICD-10-CM | POA: Diagnosis not present

## 2021-04-10 NOTE — Telephone Encounter (Signed)
Rush Landmark is at Dialysis right now, so she will get picture when he gets home and send it but it will probably be tomorrow.

## 2021-04-10 NOTE — Telephone Encounter (Addendum)
Devine Klingel [phone]  [name] called to say that Rush Landmark had gone to [facility] clinic 03/09/2021 and been diagnosed with Cellulitis on his right foot and lower leg. He has places that are hard and swollen, They gave him 10 days of Doxycycline 100 mg. No fever. She was asking should he go to podiatrist or come and see you?

## 2021-04-11 ENCOUNTER — Telehealth (INDEPENDENT_AMBULATORY_CARE_PROVIDER_SITE_OTHER): Payer: Medicare Other | Admitting: Internal Medicine

## 2021-04-11 ENCOUNTER — Emergency Department (HOSPITAL_COMMUNITY)
Admission: EM | Admit: 2021-04-11 | Discharge: 2021-04-11 | Disposition: A | Discharge status: Home / Self Care | Payer: Medicare Other | Attending: Emergency Medicine | Admitting: Emergency Medicine

## 2021-04-11 ENCOUNTER — Emergency Department (HOSPITAL_BASED_OUTPATIENT_CLINIC_OR_DEPARTMENT_OTHER): Payer: Medicare Other

## 2021-04-11 ENCOUNTER — Encounter: Payer: Self-pay | Admitting: Internal Medicine

## 2021-04-11 ENCOUNTER — Telehealth: Payer: Medicare Other | Admitting: Internal Medicine

## 2021-04-11 ENCOUNTER — Encounter (HOSPITAL_COMMUNITY): Payer: Self-pay | Admitting: Emergency Medicine

## 2021-04-11 ENCOUNTER — Other Ambulatory Visit: Payer: Self-pay

## 2021-04-11 ENCOUNTER — Encounter (HOSPITAL_COMMUNITY): Payer: Self-pay

## 2021-04-11 DIAGNOSIS — I959 Hypotension, unspecified: Secondary | ICD-10-CM | POA: Diagnosis not present

## 2021-04-11 DIAGNOSIS — I2510 Atherosclerotic heart disease of native coronary artery without angina pectoris: Secondary | ICD-10-CM | POA: Insufficient documentation

## 2021-04-11 DIAGNOSIS — Z87820 Personal history of traumatic brain injury: Secondary | ICD-10-CM

## 2021-04-11 DIAGNOSIS — M79661 Pain in right lower leg: Secondary | ICD-10-CM | POA: Diagnosis present

## 2021-04-11 DIAGNOSIS — L03115 Cellulitis of right lower limb: Secondary | ICD-10-CM

## 2021-04-11 DIAGNOSIS — F0280 Dementia in other diseases classified elsewhere without behavioral disturbance: Secondary | ICD-10-CM

## 2021-04-11 DIAGNOSIS — M79604 Pain in right leg: Secondary | ICD-10-CM

## 2021-04-11 DIAGNOSIS — N186 End stage renal disease: Secondary | ICD-10-CM

## 2021-04-11 DIAGNOSIS — E119 Type 2 diabetes mellitus without complications: Secondary | ICD-10-CM | POA: Diagnosis not present

## 2021-04-11 DIAGNOSIS — Z992 Dependence on renal dialysis: Secondary | ICD-10-CM | POA: Insufficient documentation

## 2021-04-11 DIAGNOSIS — Z794 Long term (current) use of insulin: Secondary | ICD-10-CM | POA: Insufficient documentation

## 2021-04-11 DIAGNOSIS — Z8739 Personal history of other diseases of the musculoskeletal system and connective tissue: Secondary | ICD-10-CM | POA: Diagnosis not present

## 2021-04-11 DIAGNOSIS — Z87891 Personal history of nicotine dependence: Secondary | ICD-10-CM | POA: Diagnosis not present

## 2021-04-11 DIAGNOSIS — M7989 Other specified soft tissue disorders: Secondary | ICD-10-CM

## 2021-04-11 DIAGNOSIS — Z79899 Other long term (current) drug therapy: Secondary | ICD-10-CM | POA: Diagnosis not present

## 2021-04-11 DIAGNOSIS — E1122 Type 2 diabetes mellitus with diabetic chronic kidney disease: Secondary | ICD-10-CM | POA: Diagnosis not present

## 2021-04-11 DIAGNOSIS — E538 Deficiency of other specified B group vitamins: Secondary | ICD-10-CM | POA: Diagnosis not present

## 2021-04-11 DIAGNOSIS — I10 Essential (primary) hypertension: Secondary | ICD-10-CM | POA: Diagnosis not present

## 2021-04-11 DIAGNOSIS — D631 Anemia in chronic kidney disease: Secondary | ICD-10-CM | POA: Insufficient documentation

## 2021-04-11 DIAGNOSIS — I120 Hypertensive chronic kidney disease with stage 5 chronic kidney disease or end stage renal disease: Secondary | ICD-10-CM | POA: Diagnosis not present

## 2021-04-11 DIAGNOSIS — G309 Alzheimer's disease, unspecified: Secondary | ICD-10-CM | POA: Insufficient documentation

## 2021-04-11 DIAGNOSIS — E039 Hypothyroidism, unspecified: Secondary | ICD-10-CM | POA: Insufficient documentation

## 2021-04-11 DIAGNOSIS — R609 Edema, unspecified: Secondary | ICD-10-CM | POA: Diagnosis not present

## 2021-04-11 LAB — CBC WITH DIFFERENTIAL/PLATELET
Abs Immature Granulocytes: 0.03 10*3/uL (ref 0.00–0.07)
Basophils Absolute: 0 10*3/uL (ref 0.0–0.1)
Basophils Relative: 1 %
Eosinophils Absolute: 0.3 10*3/uL (ref 0.0–0.5)
Eosinophils Relative: 4 %
HCT: 33.1 % — ABNORMAL LOW (ref 39.0–52.0)
Hemoglobin: 10.7 g/dL — ABNORMAL LOW (ref 13.0–17.0)
Immature Granulocytes: 1 %
Lymphocytes Relative: 32 %
Lymphs Abs: 2.1 10*3/uL (ref 0.7–4.0)
MCH: 32.2 pg (ref 26.0–34.0)
MCHC: 32.3 g/dL (ref 30.0–36.0)
MCV: 99.7 fL (ref 80.0–100.0)
Monocytes Absolute: 0.6 10*3/uL (ref 0.1–1.0)
Monocytes Relative: 9 %
Neutro Abs: 3.5 10*3/uL (ref 1.7–7.7)
Neutrophils Relative %: 53 %
Platelets: 173 10*3/uL (ref 150–400)
RBC: 3.32 MIL/uL — ABNORMAL LOW (ref 4.22–5.81)
RDW: 15.9 % — ABNORMAL HIGH (ref 11.5–15.5)
WBC: 6.4 10*3/uL (ref 4.0–10.5)
nRBC: 0 % (ref 0.0–0.2)

## 2021-04-11 LAB — BASIC METABOLIC PANEL
Anion gap: 11 (ref 5–15)
BUN: 26 mg/dL — ABNORMAL HIGH (ref 8–23)
CO2: 30 mmol/L (ref 22–32)
Calcium: 9.5 mg/dL (ref 8.9–10.3)
Chloride: 98 mmol/L (ref 98–111)
Creatinine, Ser: 5.96 mg/dL — ABNORMAL HIGH (ref 0.61–1.24)
GFR, Estimated: 9 mL/min — ABNORMAL LOW (ref >60)
Glucose, Bld: 109 mg/dL — ABNORMAL HIGH (ref 70–99)
Potassium: 3.7 mmol/L (ref 3.5–5.1)
Sodium: 139 mmol/L (ref 135–145)

## 2021-04-11 MED ORDER — DOXYCYCLINE HYCLATE 100 MG PO TABS
100.0000 mg | ORAL_TABLET | Freq: Once | ORAL | Status: AC
  Administered 2021-04-11: 100 mg via ORAL
  Filled 2021-04-11: qty 1

## 2021-04-11 MED ORDER — ACETAMINOPHEN 325 MG PO TABS
650.0000 mg | ORAL_TABLET | Freq: Once | ORAL | Status: AC
  Administered 2021-04-11: 650 mg via ORAL
  Filled 2021-04-11: qty 2

## 2021-04-11 MED ORDER — DOXYCYCLINE HYCLATE 100 MG PO CAPS: 100.0000 mg | ORAL_CAPSULE | Freq: Two times a day (BID) | ORAL | 0 refills | Status: DC

## 2021-04-11 NOTE — Telephone Encounter (Signed)
Scheduled virtual visit °

## 2021-04-11 NOTE — Progress Notes (Addendum)
Subjective:    Patient ID: [name], male    DOB: 1941-03-08, 80 y.o.   MRN: [id number]  HPI 80 year old Male dialysis patient seen today by interactive audio and video telecommunications due to the Coronavirus pandemic.  He is identified using 2 identifiers as [name]. [place], a patient in this practice.  His wife is present with him.  He is at his home and I am at my office.  He is agreeable to visit in this format today.  Patient went to an urgent care center near his home some 2 weeks ago on June 16 regarding redness and swelling right foot and lower leg.  Was diagnosed with cellulitis and was given 10 days of doxycycline.  He had some slight improvement but has not completely improved.  This was not addressed at his recent dialysis appointment.  Wife called for advice regarding this.  She indicated there are places on his foot that are hard and swollen.  They are planning a vacation in early August and are concerned about this.  He has a history of Diabetes mellitus followed by Dr. Dwyane Dee.  He has a history of dementia followed by [name] and Dr. Sima Matas, Neuropsychologist.  History of right intertrochanteric nondisplaced femur fracture after a fall at home hospitalized February 2022.  Underwent right ORIF November 08, 2020.  He has been treated with Prolia in the past for osteoporosis.  Has some chronic dermatitis treated with Temovate and sometimes oral Vistaril.  History of hypothyroidism treated with thyroid replacement medication.  Dr. Joelyn Oms is his Nephrologist at [facility].  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  History of gout, hyperlipidemia, allergic rhinitis.  History of peripheral vascular disease.  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 Ellene Route.  He went to rehab for period of time and recovered.  He also had fractured  ribs.  All of this was related to a fall.  History of GI bleed October 2019.  History of vitamin D deficiency and is supposed to get B12 injections monthly.  History of small bowel obstruction treated with surgical lysis of adhesions in 1996.  Sometimes gets diverticulitis but not recently.  Social history: He is married.  Wife is supportive.  1 adopted daughter.  Non-smoker.  Social alcohol consumption.  He is retired from [employer].  Family history: Father died of congestive heart failure at age 20.  Mother died at age 18 of a brain aneurysm.  He is an only child.  Palliative care is also involved in his home care.  Review of Systems He has had no nausea, vomiting, documented fever or shaking chills.  He is alert and recognizes me.  He has a history of dementia.     Objective:   Physical Exam He appears to recognize me.  Vital signs were not obtained.  There appears to be erythema of the right lateral leg and right upper foot area.  I am concerned this could be cellulitis or ischemia.  It is not clear to me and I cannot see that well on video.      Assessment & Plan:  Possible cellulitis right foot and lower leg  He does have a remote history of gout  Rule out DVT  History of dementia  Diabetes mellitus  History of end-stage renal disease requiring dialysis  History of right femur fracture with ORIF  Plan: I have advised his wife to take him to the Emergency department for further evaluation.  He needs a CBC and perhaps other studies.  He does have a history of peripheral vascular disease status post bifemoral bypass graft 1992.

## 2021-04-11 NOTE — ED Triage Notes (Signed)
Pt arrives via EMS from home with right leg redness and swelling. States pain is when he is moving. Last HD yesterday. Also states he completed abx for cellulitis.

## 2021-04-11 NOTE — Progress Notes (Signed)
RLE venous duplex has been completed.  Preliminary results given to Burman Nieves, RN.  Results can be found under chart review under CV PROC. 04/11/2021 6:32 PM Dechelle Attaway RVT, RDMS

## 2021-04-11 NOTE — ED Notes (Signed)
Paged vascular tech. 

## 2021-04-11 NOTE — ED Notes (Signed)
RN called lab to check on lab status, stated they were about to process them

## 2021-04-11 NOTE — Patient Instructions (Signed)
Patient appears to have a cellulitis but cannot rule out an acute gout attack or DVT.  Needs to be seen and obtain some stat lab studies.  He was being referred to the emergency department.

## 2021-04-11 NOTE — Discharge Instructions (Addendum)
You were seen in the emergency department tonight for right lower extremity pain, redness, and swelling.  Your ultrasound did not show a blood clot.  Your labs appeared similar to prior blood work you have had done.  We are sending in doxycycline to treat you for cellulitis again, you were given your first dose in the emergency department, please take this as prescribed.  We have prescribed you new medication(s) today. Discuss the medications prescribed today with your pharmacist as they can have adverse effects and interactions with your other medicines including over the counter and prescribed medications. Seek medical evaluation if you start to experience new or abnormal symptoms after taking one of these medicines, seek care immediately if you start to experience difficulty breathing, feeling of your throat closing, facial swelling, or rash as these could be indications of a more serious allergic reaction  Please follow-up with your primary care provider for recheck within 3 to 5 days.  We could not feel your pulses but could not hear them with a Doppler machine, please follow-up with your vascular doctor as well.  Return to the emergency department for any new or worsening symptoms including but not limited to new or worsening pain, spreading redness, or foot peeling pale/blue, numbness, weakness, fever, or any other concerns.

## 2021-04-11 NOTE — ED Provider Notes (Signed)
[facility] EMERGENCY DEPARTMENT Provider Note   CSN: [id number] Arrival date & time: 04/11/21  1619     History Chief Complaint  Patient presents with   Leg Swelling    [name] is a 80 y.o. male with a hx of alzheimer's disease, ESRD on dialysis, CAD, dementia, DM, GERD, hypertension, hyperlipidemia, and hypothyroidism who presents to the ED with complaints of return of RLE discomfort, redness, and swelling for the past 1-2 weeks. Hx of same last month- resolved with doxycycline. Called PCP and had video visit and was directed to the ED. No other alleviating/aggravating factors.  Denies fever, chills, N/V, or new numbness/weakness.  Denies chest pain, dyspnea, hemoptysis, prior VTE, prior cancer, recent long travel/surgery/trauma.     HPI     Past Medical History:  Diagnosis Date   Allergy    Alzheimer's disease ([place]) 01/19/2021   Anemia    Arthritis    Cataract    bil cateracts removed   Coronary artery disease    Dementia arising in the senium and presenium ([place])    Diabetes mellitus    Type 2   Diverticulitis    ED (erectile dysfunction)    Elevated homocysteine    ESRD (end stage renal disease) on dialysis ([place]) 03/2015   M-W-F dialysis   GERD (gastroesophageal reflux disease)    pepto    Gout    Headache, unspecified 02/25/2015   Hiatal hernia    Hyperlipidemia    Hypertension    Hypothyroidism    Pneumonia    PVD (peripheral vascular disease) ([place])    has plastic aorta   Renal insufficiency    Seasonal allergies    Sleep apnea    does not wear c-pap    Patient Active Problem List   Diagnosis Date Noted   Alzheimer's disease, unspecified (CODE) ([place]) 01/24/2021   Senile nuclear sclerosis 11/22/2020   Other specified erythematous condition 11/22/2020   Other specified disease of sebaceous glands 11/22/2020   Osteoporosis 11/22/2020   Obesity 11/22/2020   Macrocytosis 11/22/2020   Dystrophia unguium 11/22/2020   Tinea pedis  11/22/2020   Pre-operative clearance    Closed nondisplaced intertrochanteric fracture of right femur ([place]) 11/07/2020   Hip fracture ([place]) 11/07/2020   Fall    Rectal bleeding 04/22/2020   AVM (arteriovenous malformation) of small bowel, acquired    Hiatal hernia    Schatzki's ring of distal esophagus    Other bacterial infections of unspecified site 08/31/2019   Hypocalcemia 04/06/2019   Melena 11/19/2018   Leukocytosis    Anemia    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/29/2018   Diabetes mellitus type 2 in nonobese (HCC)    Loose stools    Dementia arising in the senium and presenium ([place])    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[place]) 07/21/2018   Multiple fractures of ribs, left side, initial encounter for closed fracture 07/21/2018   Subdural hematoma ([place]) 07/21/2018   Gastritis, unspecified, with bleeding 06/04/2018   Angiodysplasia of stomach    Occult GI bleeding    GIB (gastrointestinal bleeding) 06/01/2018   Low back pain 01/17/2018   Anorexia    Diverticulitis of intestine, part unspecified, without perforation or abscess without bleeding 08/07/2017   Dyspnea    Macrocytic anemia 06/04/2016   Thrombocytopenia ([place]) 06/04/2016   Exertional dyspnea 06/04/2016   Arm paresthesia, left 06/04/2016   Dyspnea on exertion 06/04/2016   Tenderness of right calf 06/04/2016   Mild cognitive impairment  with memory loss 01/12/2016   ESRD on dialysis ([place]) 05/17/2015   Hyperlipidemia, unspecified 02/25/2015   Pruritus, unspecified 02/25/2015   Pain, unspecified 02/25/2015   Other specified coagulation defects ([place]) 02/25/2015   Iron deficiency anemia, unspecified 02/25/2015   Headache, unspecified 02/25/2015   Gout, unspecified 02/25/2015   Fever, unspecified 02/25/2015   Diarrhea, unspecified 02/25/2015   Anemia in chronic kidney disease 02/25/2015   Acute embolism and thrombosis of left subclavian vein ([place]) 02/25/2015   Hypoglycemia    Weakness 02/18/2015    OSA (obstructive sleep apnea) 02/02/2015   Hyperkalemia 10/05/2014   End stage renal disease ([place]) 11/18/2013   BPH (benign prostatic hyperplasia) 11/22/2012   Peripheral vascular disease ([place]) 12/24/2011   Hyperparathyroidism ([place]) 12/24/2011   Hypothyroidism 12/24/2011   Allergic rhinitis 12/24/2011   Erectile dysfunction 12/24/2011   Insulin dependent diabetes mellitus 09/03/2008   Essential hypertension 08/30/2008   PANCREATITIS, HX OF 08/30/2008   RENAL FAILURE, ACUTE, HX OF 08/30/2008   DIVERTICULOSIS, COLON 06/08/2003    Past Surgical History:  Procedure Laterality Date   ANGIOPLASTY Right 01/26/2021   Procedure: ANGIOPLASTY RIGHT SUBCLAVIAN VEIN;  Surgeon: Serafina Mitchell, MD;  Location: [facility];  Service: Vascular;  Laterality: Right;   aortobifemoral bypass     AV FISTULA PLACEMENT Left 12/01/2013   Procedure: ARTERIOVENOUS (AV) FISTULA CREATION- LEFT BRACHIOCEPHALIC;  Surgeon: Angelia Mould, MD;  Location: [facility];  Service: Vascular;  Laterality: Left;   BASCILIC VEIN TRANSPOSITION Right 07/27/2014   Procedure: BASCILIC VEIN TRANSPOSITION;  Surgeon: Angelia Mould, MD;  Location: [facility];  Service: Vascular;  Laterality: Right;   BRAIN SURGERY  07/22/2018   BREAST SURGERY     left - granulomatous mastitis   BURR HOLE Bilateral 07/22/2018   Procedure: BILATERAL BURR HOLES;  Surgeon: Kristeen Miss, MD;  Location: [facility];  Service: Neurosurgery;  Laterality: Bilateral;   COLONOSCOPY     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 ([place] HX)  1992   ENTEROSCOPY N/A 06/03/2018   Procedure: ENTEROSCOPY;  Surgeon: Lavena Bullion, DO;  [facility];  Service: Gastroenterology;  Laterality: N/A;   ENTEROSCOPY N/A 11/20/2018   Procedure: ENTEROSCOPY;  Surgeon: Rush Landmark Telford Nab., MD;  Location: [facility];  Service: Gastroenterology;  Laterality: N/A;   ENTEROSCOPY N/A 12/03/2019   Procedure: ENTEROSCOPY;  Surgeon: Lavena Bullion, DO;  [facility];   Service: Gastroenterology;  Laterality: N/A;  push enteroscopy   EYE SURGERY Bilateral    cataracts   FISTULOGRAM Right 01/26/2021   Procedure: FISTULOGRAM RIGHT;  Surgeon: Serafina Mitchell, MD;  Location: [facility] OR;  Service: Vascular;  Laterality: Right;   HEMODIALYSIS INPATIENT  01/17/2018       HOT HEMOSTASIS N/A 06/03/2018   Procedure: HOT HEMOSTASIS (ARGON PLASMA COAGULATION/BICAP);  Surgeon: Lavena Bullion, DO;  Location: [facility] ENDOSCOPY;  Service: Gastroenterology;  Laterality: N/A;   HOT HEMOSTASIS N/A 11/20/2018   Procedure: HOT HEMOSTASIS (ARGON PLASMA COAGULATION/BICAP);  Surgeon: Irving Copas., MD;  Location: [facility];  Service: Gastroenterology;  Laterality: N/A;   HOT HEMOSTASIS N/A 12/03/2019   Procedure: HOT HEMOSTASIS (ARGON PLASMA COAGULATION/BICAP);  Surgeon: Lavena Bullion, DO;  [facility];  Service: Gastroenterology;  Laterality: N/A;   INTRAMEDULLARY (IM) NAIL INTERTROCHANTERIC Right 11/08/2020   Procedure: INTRAMEDULLARY (IM) NAIL INTERTROCHANTRIC;  Surgeon: Erle Crocker, MD;  Location: [facility];  Service: Orthopedics;  Laterality: Right;   REVISION OF ARTERIOVENOUS GORETEX GRAFT Right 09/01/2019   Procedure: REVISION OF  ARTERIOVENOUS FISTULA RIGHT ARM;  Surgeon: Serafina Mitchell, MD;  Location: [facility] OR;  Service: Vascular;  Laterality: Right;   REVISON OF ARTERIOVENOUS FISTULA Left 02/09/2014   Procedure: REVISON OF LEFT ARTERIOVENOUS FISTULA - RESECTION OF RENDUNDANT VEIN;  Surgeon: Angelia Mould, MD;  Location: [facility];  Service: Vascular;  Laterality: Left;   REVISON OF ARTERIOVENOUS FISTULA Right 01/26/2021   Procedure: REVISON OF ARTERIOVENOUS FISTULA RIGHT;  Surgeon: Serafina Mitchell, MD;  Location: [facility];  Service: Vascular;  Laterality: Right;   SBO with lysis adhesions     SHUNTOGRAM Left 04/19/2014   Procedure: FISTULOGRAM;  Surgeon: Angelia Mould, MD;  Location: [facility] C[facility] LAB;  Service: Cardiovascular;  Laterality: Left;    UNILATERAL UPPER EXTREMEITY ANGIOGRAM N/A 07/12/2014   Procedure: UNILATERAL UPPER Anselmo Rod;  Surgeon: Angelia Mould, MD;  Location: [facility] CATH LAB;  Service: Cardiovascular;  Laterality: N/A;       Family History  Problem Relation Age of Onset   Aneurysm Mother        brain   Heart disease Father    Stroke Father    Hypertension Father    Diabetes Father    Dementia Neg Hx    Colon cancer Neg Hx    Esophageal cancer Neg Hx    Pancreatic cancer Neg Hx    Prostate cancer Neg Hx    Rectal cancer Neg Hx    Stomach cancer Neg Hx     Social History   Tobacco Use   Smoking status: Former    Types: Cigarettes    Quit date: 09/24/1994    Years since quitting: 26.5   Smokeless tobacco: Never  Vaping Use   Vaping Use: Never used  Substance Use Topics   Alcohol use: No    Alcohol/week: 0.0 standard drinks    Comment: Quit Oct. 1977 ("somewhat heavy")   Drug use: No    Home Medications Prior to Admission medications   Medication Sig Start Date End Date Taking? Authorizing Provider  acetaminophen (TYLENOL) 325 MG tablet Take 1-2 tablets (325-650 mg total) by mouth every 4 (four) hours as needed for mild pain. 08/01/18   Love, Ivan Anchors, PA-C  albuterol (VENTOLIN HFA) 108 (90 Base) MCG/ACT inhaler Inhale 1 puff into the lungs daily as needed for wheezing or shortness of breath.    [provider]  allopurinol (ZYLOPRIM) 100 MG tablet Take 100 mg by mouth daily.    [provider]  Blood Glucose Monitoring Suppl (FREESTYLE FREEDOM LITE) w/Device KIT Use to check blood sugar 2 times per day dx code E11.65 10/21/18   Elayne Snare, MD  camphor-menthol [facility]) lotion Apply 1 application topically 4 (four) times daily as needed for itching.     [provider]  Carboxymethylcellulose Sodium (THERATEARS) 0.25 % SOLN Place 1 drop into both eyes as needed (for dryness).    [provider]  cetirizine (ZYRTEC) 10 MG chewable tablet Chew 10 mg by  mouth every Monday, Wednesday, and Friday.    [provider]  clobetasol (TEMOVATE) 0.05 % external solution Apply 1 application topically 2 (two) times daily as needed (scalp irritation.).  04/19/15   [provider]  donepezil (ARICEPT) 10 MG tablet Take 1 tablet (10 mg total) by mouth at bedtime. 07/28/20   Melvenia Beam, MD  FLUOCINOLONE ACETONIDE SCALP 0.01 % OIL Apply 1 application topically daily as needed (scalp irritation.).  04/28/15   [provider]  fluticasone (FLONASE) 50 MCG/ACT  nasal spray Place 1 spray into both nostrils daily. 10/08/14   Mikhail, Velta Addison, DO  glucose blood (FREESTYLE LITE) test strip USE AS DIRECTED THREE TIMES DAILY 01/16/21   Elayne Snare, MD  HYDROcodone-acetaminophen (NORCO/VICODIN) 5-325 MG tablet Take 1 tablet by mouth every 4 (four) hours as needed for moderate pain. 01/26/21 01/26/22  Setzer, Edman Circle, PA-C  hydrocortisone 2.5 % cream Apply 1 application topically 3 (three) times daily as needed (skin irritation (legs & arms)).  01/13/19   [provider]  insulin aspart (NOVOLOG FLEXPEN) 100 UNIT/ML FlexPen INJECT 6-8 UNITS UNDER THE SKIN THREE TIMES DAILY BEFORE MEALS. DX:E11.65 Patient taking differently: Inject 6-8 Units into the skin 3 (three) times daily with meals. INJECT 6-8 UNITS UNDER THE SKIN THREE TIMES DAILY BEFORE MEALS. DX:E11.65 11/25/18   Elayne Snare, MD  Insulin Pen Needle (PEN NEEDLES) 32G X 4 MM MISC 1 each by Does not apply route 3 (three) times daily. USE PEN NEEDLES TO INJECT INSULIN THREE TIMES DAILY. 11/25/18   Elayne Snare, MD  ketoconazole (NIZORAL) 2 % shampoo Apply 1 application topically 2 (two) times a week. 03/31/15   Elby Showers, MD  Lancets (FREESTYLE) lancets Use as instructed to check blood sugar 2 times per day dx code E11.65 02/22/16   Elayne Snare, MD  levothyroxine (SYNTHROID, LEVOTHROID) 50 MCG tablet Take 1 tablet (50 mcg total) by mouth daily. Patient taking differently: Take 50 mcg by mouth  daily before breakfast. 08/06/14   Baxley, Cresenciano Lick, MD  lidocaine (LIDODERM) 5 % Place 1 patch onto the skin daily as needed (back pain).  11/12/19   [provider]  memantine (NAMENDA) 10 MG tablet Take 1 tablet (10 mg total) by mouth 2 (two) times daily. 07/28/20   Melvenia Beam, MD  midodrine (PROAMATINE) 10 MG tablet Take one pill prior to hemodialysis on MWF Patient taking differently: Take 5 mg by mouth every Monday, Wednesday, and Friday. Take one pill prior to hemodialysis on MWF 08/13/18   Love, Ivan Anchors, PA-C  Nutritional Supplements (FEEDING SUPPLEMENT, NEPRO CARB STEADY,) LIQD Take 237 mLs by mouth in the morning and at bedtime.     [provider]  omeprazole (PRILOSEC) 40 MG capsule Take 1 capsule (40 mg total) by mouth daily. 03/13/18   Esterwood, Amy S, PA-C  sevelamer carbonate (RENVELA) 800 MG tablet Take 1,600-2,400 mg by mouth See admin instructions. Take 2,400 mg by mouth three times a day with meals and 1,600 mg with snacks on Sun/Tues/Thurs/Sat and 2,400 mg with breakfast and dinner and nothing with snacks on Mon/Wed/Fri    [provider]  simvastatin (ZOCOR) 20 MG tablet Take 20 mg by mouth at bedtime.    [provider]  Teriparatide, Recombinant, (FORTEO) 600 MCG/2.4ML SOPN Inject 20 mcg subcutaneously daily 03/25/21   Elayne Snare, MD  TRADJENTA 5 MG TABS tablet TAKE 1 TABLET DAILY Patient taking differently: Take 5 mg by mouth daily. 03/01/20   Elayne Snare, MD    Allergies    Ambien [zolpidem tartrate] and Penicillins  Review of Systems   Review of Systems  Constitutional:  Negative for chills and fever.  Respiratory:  Negative for shortness of breath.   Cardiovascular:  Positive for leg swelling. Negative for chest pain.  Musculoskeletal:  Positive for arthralgias and myalgias.  Skin:  Positive for color change.  Neurological:  Negative for weakness and numbness.  All other systems reviewed and are negative.  Physical  Exam Updated Vital Signs BP Marland Kitchen)  128/48   Pulse 65   Temp 98.3 F (36.8 C) (Oral)   Resp 18   Ht 5' 11" (1.803 m)   Wt 65.8 kg   SpO2 100%   BMI 20.22 kg/m   Physical Exam Vitals and nursing note reviewed.  Constitutional:      General: He is not in acute distress.    Appearance: He is well-developed. He is not toxic-appearing.  HENT:     Head: Normocephalic and atraumatic.  Eyes:     General:        Right eye: No discharge.        Left eye: No discharge.     Conjunctiva/sclera: Conjunctivae normal.  Cardiovascular:     Rate and Rhythm: Normal rate and regular rhythm.     Comments: Unable to palpate DP/PT pulses, able to Doppler bilateral DP/PT pulses Pulmonary:     Effort: Pulmonary effort is normal. No respiratory distress.     Breath sounds: Normal breath sounds. No wheezing, rhonchi or rales.  Abdominal:     General: There is no distension.     Palpations: Abdomen is soft.     Tenderness: There is no abdominal tenderness.  Musculoskeletal:     Cervical back: Neck supple.     Comments: Patient has erythema with some mild warmth to the right anterior lower leg/ankle.    He also has trace edema to the right lower leg.  Patient has intact active range of motion to the knees, ankles, and all digits.  Mild right anterior lower leg and calf tenderness distally.  Otherwise nontender.  Compartments are soft.  Skin:    General: Skin is warm and dry.     Capillary Refill: Capillary refill takes less than 2 seconds.     Findings: No rash.  Neurological:     Mental Status: He is alert.     Comments: Sensation grossly intact bilateral lower extremities.  5-5 symmetric strength with plantar dorsiflexion bilaterally.  Clear speech.   Psychiatric:        Behavior: Behavior normal.     ED Results / Procedures / Treatments   Labs (all labs ordered are listed, but only abnormal results are displayed) Labs Reviewed  CBC WITH DIFFERENTIAL/PLATELET  BASIC METABOLIC PANEL     EKG None  Radiology VAS [place] LOWER EXTREMITY VENOUS (DVT) (ONLY [facility] & [facility] 7a-7p)  Result Date: 04/11/2021  Lower Venous DVT Study Patient Name:  [name] [place]  Date of Exam:   04/11/2021 Medical Rec #: [id number]       Accession #:    [id number] Date of Birth: 04/05/1941       Patient Gender: M Patient Age:   79Y Exam [facility]  [facility] Procedure:      VAS [place] LOWER EXTREMITY VENOUS (DVT) Referring Phys: [id number] [facility] R  --------------------------------------------------------------------------------  Indications: Pain, and Swelling.  Comparison Study: Previous exam 06/05/2016 - negative Performing Technologist: Rogelia Rohrer RVT, RDMS  Examination Guidelines: A complete evaluation includes B-mode imaging, spectral Doppler, color Doppler, and power Doppler as needed of all accessible portions of each vessel. Bilateral testing is considered an integral part of a complete examination. Limited examinations for reoccurring indications may be performed as noted. The reflux portion of the exam is performed with the patient in reverse Trendelenburg.  +---------+---------------+---------+-----------+----------+-------------------+ RIGHT    CompressibilityPhasicitySpontaneityPropertiesThrombus Aging      +---------+---------------+---------+-----------+----------+-------------------+ CFV      Full           Yes  Yes                                      +---------+---------------+---------+-----------+----------+-------------------+ SFJ      Full                                                             +---------+---------------+---------+-----------+----------+-------------------+ FV Prox  Full           Yes      Yes                                      +---------+---------------+---------+-----------+----------+-------------------+ FV Mid   Full           Yes      Yes                                       +---------+---------------+---------+-----------+----------+-------------------+ FV DistalFull           Yes      Yes                                      +---------+---------------+---------+-----------+----------+-------------------+ PFV      Full           Yes      Yes                                      +---------+---------------+---------+-----------+----------+-------------------+ POP      Full           Yes      Yes                                      +---------+---------------+---------+-----------+----------+-------------------+ PTV      Full                                                             +---------+---------------+---------+-----------+----------+-------------------+ PERO     Full                                         Not well visualized +---------+---------------+---------+-----------+----------+-------------------+   +----+---------------+---------+-----------+----------+--------------+ LEFTCompressibilityPhasicitySpontaneityPropertiesThrombus Aging +----+---------------+---------+-----------+----------+--------------+ CFV Full           Yes      Yes                                 +----+---------------+---------+-----------+----------+--------------+     Summary: RIGHT: - There is no evidence of deep vein thrombosis in  the lower extremity. - There is no evidence of superficial venous thrombosis.  - No cystic structure found in the popliteal fossa.  LEFT: - No evidence of common femoral vein obstruction.  *See table(s) above for measurements and observations. Electronically signed by Deitra Mayo MD on 04/11/2021 at 7:11:37 PM.    Final     Procedures Procedures   Medications Ordered in ED Medications  acetaminophen (TYLENOL) tablet 650 mg (650 mg Oral Given 04/11/21 1655)    ED Course  I have reviewed the triage vital signs and the nursing notes.  Pertinent labs & imaging results that were available during my care of the  patient were reviewed by me and considered in my medical decision making (see chart for details).    MDM Rules/Calculators/A&P                           Patient presents to the ED with complaints of Right lower extremity pain/swelling/redness over the past 1 to 2 weeks.  Nontoxic, resting comfortably, vitals without significant abnormality.  Additional history obtained:  Additional history obtained from chart review & nursing note review.   Lab Tests:  I Ordered, reviewed, and interpreted labs, which included:  CBC: Mild anemia similar to prior. BMP: Renal function similar to prior, consistent with ESRD.  Imaging Studies ordered:  I ordered imaging studies which included Right lower extremity venous duplex, I independently reviewed, formal radiology impression shows:  RIGHT: - There is no evidence of deep vein thrombosis in the lower extremity. - There is no evidence of superficial venous thrombosis.  - No cystic structure found in the popliteal fossa.  LEFT: - No evidence of common femoral vein obstruction.    ED Course:  Labs overall reassuring and similar to prior.  Right lower extremity venous duplex negative for DVT or superficial venous thrombosis.  No cystic structures present. Symmetric dopplerable PT/DP pulses bilaterally, < 2 second capillary refill to digits, not cool to the touch- does not seem consistent w/ acute ischemia.  Patient has good range of motion at the ankle seems less likely to be a septic joint.  No point/focal bony tenderness or recent trauma to suggest fracture.Given similar sxs which resolved with abx in the past will re-treat for cellulitis and have patient follow up with PCP, no fever/leukocytosis, clinically no significant concern for sepsis at this time. I discussed results, treatment plan, need for follow-up, and return precautions with the patient and his wife. Provided opportunity for questions, patient & his wife confirmed understanding and are in agreement  with plan.   Findings and plan of care discussed with supervising physician Dr. Ralene Bathe who is in agreement.    Portions of this note were generated with [employer]. Dictation errors may occur despite best attempts at proofreading.  Final Clinical Impression(s) / ED Diagnoses Final diagnoses:  Cellulitis of right lower extremity    Rx / DC Orders ED Discharge Orders          Ordered    doxycycline (VIBRAMYCIN) 100 MG capsule  2 times daily        04/11/21 2101             Lyna Laningham, Glynda Jaeger, PA-C 04/11/21 2102    Quintella Reichert, MD 04/12/21 [phone]

## 2021-04-12 DIAGNOSIS — E1129 Type 2 diabetes mellitus with other diabetic kidney complication: Secondary | ICD-10-CM | POA: Diagnosis not present

## 2021-04-12 DIAGNOSIS — D631 Anemia in chronic kidney disease: Secondary | ICD-10-CM | POA: Diagnosis not present

## 2021-04-12 DIAGNOSIS — N186 End stage renal disease: Secondary | ICD-10-CM | POA: Diagnosis not present

## 2021-04-12 DIAGNOSIS — Z992 Dependence on renal dialysis: Secondary | ICD-10-CM | POA: Diagnosis not present

## 2021-04-12 DIAGNOSIS — N2581 Secondary hyperparathyroidism of renal origin: Secondary | ICD-10-CM | POA: Diagnosis not present

## 2021-04-12 DIAGNOSIS — E876 Hypokalemia: Secondary | ICD-10-CM | POA: Diagnosis not present

## 2021-04-13 DIAGNOSIS — N186 End stage renal disease: Secondary | ICD-10-CM | POA: Diagnosis not present

## 2021-04-13 DIAGNOSIS — D631 Anemia in chronic kidney disease: Secondary | ICD-10-CM | POA: Diagnosis not present

## 2021-04-13 DIAGNOSIS — E1122 Type 2 diabetes mellitus with diabetic chronic kidney disease: Secondary | ICD-10-CM | POA: Diagnosis not present

## 2021-04-13 DIAGNOSIS — F0390 Unspecified dementia without behavioral disturbance: Secondary | ICD-10-CM | POA: Diagnosis not present

## 2021-04-13 DIAGNOSIS — S72144D Nondisplaced intertrochanteric fracture of right femur, subsequent encounter for closed fracture with routine healing: Secondary | ICD-10-CM | POA: Diagnosis not present

## 2021-04-13 DIAGNOSIS — I120 Hypertensive chronic kidney disease with stage 5 chronic kidney disease or end stage renal disease: Secondary | ICD-10-CM | POA: Diagnosis not present

## 2021-04-14 DIAGNOSIS — Z992 Dependence on renal dialysis: Secondary | ICD-10-CM | POA: Diagnosis not present

## 2021-04-14 DIAGNOSIS — E1129 Type 2 diabetes mellitus with other diabetic kidney complication: Secondary | ICD-10-CM | POA: Diagnosis not present

## 2021-04-14 DIAGNOSIS — N186 End stage renal disease: Secondary | ICD-10-CM | POA: Diagnosis not present

## 2021-04-14 DIAGNOSIS — N2581 Secondary hyperparathyroidism of renal origin: Secondary | ICD-10-CM | POA: Diagnosis not present

## 2021-04-14 DIAGNOSIS — D631 Anemia in chronic kidney disease: Secondary | ICD-10-CM | POA: Diagnosis not present

## 2021-04-14 DIAGNOSIS — E876 Hypokalemia: Secondary | ICD-10-CM | POA: Diagnosis not present

## 2021-04-17 DIAGNOSIS — D631 Anemia in chronic kidney disease: Secondary | ICD-10-CM | POA: Diagnosis not present

## 2021-04-17 DIAGNOSIS — E1129 Type 2 diabetes mellitus with other diabetic kidney complication: Secondary | ICD-10-CM | POA: Diagnosis not present

## 2021-04-17 DIAGNOSIS — E876 Hypokalemia: Secondary | ICD-10-CM | POA: Diagnosis not present

## 2021-04-17 DIAGNOSIS — N2581 Secondary hyperparathyroidism of renal origin: Secondary | ICD-10-CM | POA: Diagnosis not present

## 2021-04-17 DIAGNOSIS — N186 End stage renal disease: Secondary | ICD-10-CM | POA: Diagnosis not present

## 2021-04-17 DIAGNOSIS — Z992 Dependence on renal dialysis: Secondary | ICD-10-CM | POA: Diagnosis not present

## 2021-04-19 DIAGNOSIS — E1129 Type 2 diabetes mellitus with other diabetic kidney complication: Secondary | ICD-10-CM | POA: Diagnosis not present

## 2021-04-19 DIAGNOSIS — N2581 Secondary hyperparathyroidism of renal origin: Secondary | ICD-10-CM | POA: Diagnosis not present

## 2021-04-19 DIAGNOSIS — D631 Anemia in chronic kidney disease: Secondary | ICD-10-CM | POA: Diagnosis not present

## 2021-04-19 DIAGNOSIS — E876 Hypokalemia: Secondary | ICD-10-CM | POA: Diagnosis not present

## 2021-04-19 DIAGNOSIS — Z992 Dependence on renal dialysis: Secondary | ICD-10-CM | POA: Diagnosis not present

## 2021-04-19 DIAGNOSIS — N186 End stage renal disease: Secondary | ICD-10-CM | POA: Diagnosis not present

## 2021-04-21 DIAGNOSIS — S72144D Nondisplaced intertrochanteric fracture of right femur, subsequent encounter for closed fracture with routine healing: Secondary | ICD-10-CM | POA: Diagnosis not present

## 2021-04-21 DIAGNOSIS — E1122 Type 2 diabetes mellitus with diabetic chronic kidney disease: Secondary | ICD-10-CM | POA: Diagnosis not present

## 2021-04-21 DIAGNOSIS — E876 Hypokalemia: Secondary | ICD-10-CM | POA: Diagnosis not present

## 2021-04-21 DIAGNOSIS — N2581 Secondary hyperparathyroidism of renal origin: Secondary | ICD-10-CM | POA: Diagnosis not present

## 2021-04-21 DIAGNOSIS — D631 Anemia in chronic kidney disease: Secondary | ICD-10-CM | POA: Diagnosis not present

## 2021-04-21 DIAGNOSIS — N186 End stage renal disease: Secondary | ICD-10-CM | POA: Diagnosis not present

## 2021-04-21 DIAGNOSIS — I120 Hypertensive chronic kidney disease with stage 5 chronic kidney disease or end stage renal disease: Secondary | ICD-10-CM | POA: Diagnosis not present

## 2021-04-21 DIAGNOSIS — E1129 Type 2 diabetes mellitus with other diabetic kidney complication: Secondary | ICD-10-CM | POA: Diagnosis not present

## 2021-04-21 DIAGNOSIS — Z992 Dependence on renal dialysis: Secondary | ICD-10-CM | POA: Diagnosis not present

## 2021-04-21 DIAGNOSIS — F0390 Unspecified dementia without behavioral disturbance: Secondary | ICD-10-CM | POA: Diagnosis not present

## 2021-04-24 DIAGNOSIS — E1129 Type 2 diabetes mellitus with other diabetic kidney complication: Secondary | ICD-10-CM | POA: Diagnosis not present

## 2021-04-24 DIAGNOSIS — N186 End stage renal disease: Secondary | ICD-10-CM | POA: Diagnosis not present

## 2021-04-24 DIAGNOSIS — Z992 Dependence on renal dialysis: Secondary | ICD-10-CM | POA: Diagnosis not present

## 2021-04-24 DIAGNOSIS — N2581 Secondary hyperparathyroidism of renal origin: Secondary | ICD-10-CM | POA: Diagnosis not present

## 2021-04-24 DIAGNOSIS — D631 Anemia in chronic kidney disease: Secondary | ICD-10-CM | POA: Diagnosis not present

## 2021-04-24 DIAGNOSIS — E1122 Type 2 diabetes mellitus with diabetic chronic kidney disease: Secondary | ICD-10-CM | POA: Diagnosis not present

## 2021-04-24 DIAGNOSIS — D509 Iron deficiency anemia, unspecified: Secondary | ICD-10-CM | POA: Diagnosis not present

## 2021-04-24 DIAGNOSIS — E876 Hypokalemia: Secondary | ICD-10-CM | POA: Diagnosis not present

## 2021-04-27 DIAGNOSIS — N186 End stage renal disease: Secondary | ICD-10-CM | POA: Diagnosis not present

## 2021-04-27 DIAGNOSIS — Z992 Dependence on renal dialysis: Secondary | ICD-10-CM | POA: Diagnosis not present

## 2021-04-29 DIAGNOSIS — Z992 Dependence on renal dialysis: Secondary | ICD-10-CM | POA: Diagnosis not present

## 2021-04-29 DIAGNOSIS — N186 End stage renal disease: Secondary | ICD-10-CM | POA: Diagnosis not present

## 2021-05-03 DIAGNOSIS — N2581 Secondary hyperparathyroidism of renal origin: Secondary | ICD-10-CM | POA: Diagnosis not present

## 2021-05-03 DIAGNOSIS — D509 Iron deficiency anemia, unspecified: Secondary | ICD-10-CM | POA: Diagnosis not present

## 2021-05-03 DIAGNOSIS — E876 Hypokalemia: Secondary | ICD-10-CM | POA: Diagnosis not present

## 2021-05-03 DIAGNOSIS — Z992 Dependence on renal dialysis: Secondary | ICD-10-CM | POA: Diagnosis not present

## 2021-05-03 DIAGNOSIS — N186 End stage renal disease: Secondary | ICD-10-CM | POA: Diagnosis not present

## 2021-05-05 DIAGNOSIS — E876 Hypokalemia: Secondary | ICD-10-CM | POA: Diagnosis not present

## 2021-05-05 DIAGNOSIS — N2581 Secondary hyperparathyroidism of renal origin: Secondary | ICD-10-CM | POA: Diagnosis not present

## 2021-05-05 DIAGNOSIS — D509 Iron deficiency anemia, unspecified: Secondary | ICD-10-CM | POA: Diagnosis not present

## 2021-05-05 DIAGNOSIS — N186 End stage renal disease: Secondary | ICD-10-CM | POA: Diagnosis not present

## 2021-05-05 DIAGNOSIS — Z992 Dependence on renal dialysis: Secondary | ICD-10-CM | POA: Diagnosis not present

## 2021-05-08 DIAGNOSIS — N186 End stage renal disease: Secondary | ICD-10-CM | POA: Diagnosis not present

## 2021-05-08 DIAGNOSIS — E876 Hypokalemia: Secondary | ICD-10-CM | POA: Diagnosis not present

## 2021-05-08 DIAGNOSIS — D509 Iron deficiency anemia, unspecified: Secondary | ICD-10-CM | POA: Diagnosis not present

## 2021-05-08 DIAGNOSIS — Z992 Dependence on renal dialysis: Secondary | ICD-10-CM | POA: Diagnosis not present

## 2021-05-08 DIAGNOSIS — N2581 Secondary hyperparathyroidism of renal origin: Secondary | ICD-10-CM | POA: Diagnosis not present

## 2021-05-09 ENCOUNTER — Other Ambulatory Visit: Payer: Medicare Other

## 2021-05-09 ENCOUNTER — Other Ambulatory Visit: Payer: Self-pay | Admitting: Endocrinology

## 2021-05-09 ENCOUNTER — Other Ambulatory Visit: Payer: Self-pay

## 2021-05-09 ENCOUNTER — Other Ambulatory Visit (INDEPENDENT_AMBULATORY_CARE_PROVIDER_SITE_OTHER): Payer: Medicare Other

## 2021-05-09 VITALS — BP 142/58 | HR 63 | Temp 97.9°F

## 2021-05-09 DIAGNOSIS — Z794 Long term (current) use of insulin: Secondary | ICD-10-CM

## 2021-05-09 DIAGNOSIS — E1165 Type 2 diabetes mellitus with hyperglycemia: Secondary | ICD-10-CM

## 2021-05-09 DIAGNOSIS — Z515 Encounter for palliative care: Secondary | ICD-10-CM

## 2021-05-09 LAB — LIPID PANEL
Cholesterol: 81 mg/dL (ref 0–200)
HDL: 49.1 mg/dL (ref >39.00)
LDL Cholesterol: 15 mg/dL (ref 0–99)
NonHDL: 31.54
Total CHOL/HDL Ratio: 2
Triglycerides: 85 mg/dL (ref 0.0–149.0)
VLDL: 17 mg/dL (ref 0.0–40.0)

## 2021-05-09 LAB — CALCIUM: Calcium: 9.2 mg/dL (ref 8.4–10.5)

## 2021-05-09 LAB — GLUCOSE, RANDOM: Glucose, Bld: 67 mg/dL — ABNORMAL LOW (ref 70–99)

## 2021-05-09 NOTE — Progress Notes (Signed)
PATIENT NAME: NAMAN SPYCHALSKI DOB: 1941-02-01 MRN: [id number]  PRIMARY CARE PROVIDER: Elby Showers, MD  RESPONSIBLE PARTY:  Acct ID - Guarantor Home Phone Work Phone Relationship Acct Type  [id number] - Arcidiacono,[id number]  Self P/F     [address], [address], [place] [id number]    PLAN OF CARE and INTERVENTIONS:               1.  GOALS OF CARE/ ADVANCE CARE PLANNING:  Remain home under the care of his wife.               2.  PATIENT/CAREGIVER EDUCATION:  Private agencies for aide services and colace.               4. PERSONAL EMERGENCY PLAN:  Activate 911 for emergencies.               5.  DISEASE STATUS:  Joint visit completed with patient and wife.  Patient returning home from having blood work completed prior to my visit.  Patient is engaging in conversation.  Enjoys talking about his [employer] and travels.  Memory deficits notes as patient repeats himself or will look to wife for assistance with memory recall.  Patient was seen in the ED last month for cellulitis of there right lower leg.  He has completed antibiotics and area has healed.  Patient reports occasional tenderness to the area.  Fanny cream was ordered by [facility] NP on last visit.  Wife reports this worked well and areas have healed.  No current skin breakdown reported.   Patient is ambulatory with a rolling walker.  Has a lift gate in the garage that he manages well.  No falls are reported by wife.  Patient continues to have issues with constipation.  Previously tried MOM but is now on Colace that started this week.  Advised wife to contact PCP if daily dose of Colace is not effective.  Last bm was this am.   Appetite remains fair.  Patient is taking Nepro supplement drinks daily and depending on how he eats he make take a 2nd drink in the evening.  Wife states patient will eat a sausage or ham biscuit in the mornings.  Depending on the day he may have a 1/2 sandwich for a late lunch and then a regular meal for  dinner.  Continues with dialysis routinely and no issues are noted at this time.  Wife is hopeful to have additional assistance in the home for weekend coverage.  Currently has [place] assistance Monday-Friday.  Wife noted they have not maxed out on time offered for in-home assistance with the [place].  I have encouraged her to speak with patient's SW at the [place] regarding increasing hours.  If this is not an option, I have advised of local agencies that offer private duty aides.  Overall, wife and patient feel he is doing well.  Wife has no concerns managing patient's care.   HISTORY OF PRESENT ILLNESS:  80 year old male with Dementia, DM, HTN and ESRD.  Patient is being followed by Palliative Care every 4-8 weeks and PRN.  CODE STATUS: DNR ADVANCED DIRECTIVES: No MOST FORM: Yes PPS: 50%   PHYSICAL EXAM:   VITALS: Today's Vitals   05/09/21 1326  BP: (!) 142/58  Pulse: 63  Temp: 97.9 F (36.6 C)  SpO2: 99%  PainSc: 0-No pain    LUNGS: clear to auscultation  CARDIAC: Cor RRR}  EXTREMITIES: - for edema SKIN: Skin color,  texture, turgor normal. No rashes or lesions or normal  NEURO: positive for gait problems and memory problems       Lorenza Burton, RN

## 2021-05-10 DIAGNOSIS — D509 Iron deficiency anemia, unspecified: Secondary | ICD-10-CM | POA: Diagnosis not present

## 2021-05-10 DIAGNOSIS — N2581 Secondary hyperparathyroidism of renal origin: Secondary | ICD-10-CM | POA: Diagnosis not present

## 2021-05-10 DIAGNOSIS — E876 Hypokalemia: Secondary | ICD-10-CM | POA: Diagnosis not present

## 2021-05-10 DIAGNOSIS — N186 End stage renal disease: Secondary | ICD-10-CM | POA: Diagnosis not present

## 2021-05-10 DIAGNOSIS — Z992 Dependence on renal dialysis: Secondary | ICD-10-CM | POA: Diagnosis not present

## 2021-05-10 LAB — FRUCTOSAMINE: Fructosamine: 306 umol/L — ABNORMAL HIGH (ref 0–285)

## 2021-05-11 ENCOUNTER — Other Ambulatory Visit: Payer: Self-pay

## 2021-05-11 ENCOUNTER — Encounter: Payer: Self-pay | Admitting: Endocrinology

## 2021-05-11 ENCOUNTER — Ambulatory Visit (INDEPENDENT_AMBULATORY_CARE_PROVIDER_SITE_OTHER): Payer: Medicare Other | Admitting: Endocrinology

## 2021-05-11 VITALS — BP 138/62 | HR 66 | Ht 71.0 in | Wt 145.6 lb

## 2021-05-11 DIAGNOSIS — M8000XD Age-related osteoporosis with current pathological fracture, unspecified site, subsequent encounter for fracture with routine healing: Secondary | ICD-10-CM | POA: Diagnosis not present

## 2021-05-11 DIAGNOSIS — E1165 Type 2 diabetes mellitus with hyperglycemia: Secondary | ICD-10-CM

## 2021-05-11 LAB — POCT GLYCOSYLATED HEMOGLOBIN (HGB A1C): Hemoglobin A1C: 5.4 % (ref 4.0–5.6)

## 2021-05-11 NOTE — Progress Notes (Signed)
Patient ID: [name], male   DOB: 12-24-1940, 80 y.o.   MRN: [id number]           Reason for Appointment:  Follow-up for endocrinology problems   History of Present Illness:          Date of diagnosis of type 2 diabetes mellitus:  1996      Background history:  He has had long-standing diabetes probably treated with metformin initially and subsequently with sulfonylurea drugs At some point he was also given Actos in addition to his glipizide which he is still taking Records of his control are only available for the last 4 years not on Over the last few years his A1c has been generally in the 6-7% range His A1c was 6.2 in May 2016 and apparently was starting to get low normal blood sugars at times He was admitted to the hospital for fluid overload and renal failure and at that time his Actos was stopped Subsequently his blood sugars had been markedly increased He was switched from regular insulin to NovoLog on his initial consultation  Recent history:   INSULIN regimen: Previously on NovoLog 6 units with breakfast and usually 6 units before dinner  Oral hypoglycemic drugs: None, previously on Tradjenta 5 mg daily  His A1c tends to be falsely low, now 5.4  Fructosamine in relatively higher at 306   Current blood sugar patterns and problems identified: His fructosamine may be higher since on his previous assessment he had been having some hypoglycemia when his medications were stopped In the last couple of weeks his appetite has been improving although his weight is about the same  He continues to check blood sugars 2-3 times a day  As before his fasting readings are low normal  After meals he has only a couple of readings around 160-170 otherwise blood sugars are excellent in the evenings also  His wife is still trying to make sure he is eating relatively healthy meals, may have some snacks during the day at dialysis  Lab glucose done late morning fasting was 67 Again not  able to do much physical activity because of weakness   Dialysis is done at 12 pm, doing it on Monday/Wednesday/Fridays     Side effects from medications have been: None  Compliance with the medical regimen: Fair  Glucose monitoring:  done up to 3  times a day         Glucometer:  Freestyle  Blood Glucose readings by review of monitor Download and averages:  FASTING range 70-91 and nonfasting 74-177 AVERAGE overall 102  PRE-MEAL Fasting Lunch Dinner Bedtime Overall  Glucose range:  62-94  78-110  20-154    Mean/median:     86   POST-MEAL PC Breakfast PC Lunch PC Dinner  Glucose range:    80-124  Mean/median:            Self-care: The diet that the patient has been following is: tries to limit sweets, portions. eating out only about once a week at a steak house        Typical meal intake: Breakfast is variable, ham biscuit; supper 7-8 pm            Dietician visit, most recent: 08/2017   Weight history:   Wt Readings from Last 3 Encounters:  05/11/21 145 lb 9.6 oz (66 kg)  04/11/21 145 lb (65.8 kg)  02/07/21 147 lb 6.4 oz (66.9 kg)     Glycemic control:  Lab Results  Component Value Date   HGBA1C 5.4 05/11/2021   HGBA1C 4.3 (L) 02/02/2021   HGBA1C 4.9 11/08/2020   Lab Results  Component Value Date   MICROALBUR 37.2 09/03/2016   LDLCALC 15 05/09/2021   CREATININE 5.96 (H) 04/11/2021    Lab Results  Component Value Date   FRUCTOSAMINE 306 (H) 05/09/2021   FRUCTOSAMINE 277 02/02/2021   FRUCTOSAMINE 295 (H) 08/02/2020    PROBLEM 2 Osteoporosis: See review of systems     Allergies as of 05/11/2021       Reactions   Ambien [zolpidem Tartrate] Other (See Comments)   Hallucinations and "felt crazy"    Penicillins Rash, Hives   Has patient had a PCN reaction causing immediate rash, facial/tongue/throat swelling, SOB or lightheadedness with hypotension: Yes Has patient had a PCN reaction causing severe rash involving mucus membranes or skin  necrosis: Yes Has patient had a PCN reaction that required hospitalization: No Has patient had a PCN reaction occurring within the last 10 years: No If all of the above answers are "NO", then may proceed with Cephalosporin use. Other reaction(s): HIVES        Medication List        Accurate as of May 11, 2021  3:45 PM. If you have any questions, ask your nurse or doctor.          acetaminophen 325 MG tablet Commonly known as: TYLENOL Take 1-2 tablets (325-650 mg total) by mouth every 4 (four) hours as needed for mild pain.   albuterol 108 (90 Base) MCG/ACT inhaler Commonly known as: VENTOLIN HFA Inhale 1 puff into the lungs daily as needed for wheezing or shortness of breath.   allopurinol 100 MG tablet Commonly known as: ZYLOPRIM Take 100 mg by mouth daily.   camphor-menthol lotion Commonly known as: SARNA Apply 1 application topically 4 (four) times daily as needed for itching.   cetirizine 10 MG chewable tablet Commonly known as: ZYRTEC Chew 10 mg by mouth every Monday, Wednesday, and Friday.   clobetasol 0.05 % external solution Commonly known as: TEMOVATE Apply 1 application topically 2 (two) times daily as needed (scalp irritation.).   donepezil 10 MG tablet Commonly known as: ARICEPT Take 1 tablet (10 mg total) by mouth at bedtime.   doxycycline 100 MG capsule Commonly known as: VIBRAMYCIN Take 1 capsule (100 mg total) by mouth 2 (two) times daily.   feeding supplement (NEPRO CARB STEADY) Liqd Take 237 mLs by mouth in the morning and at bedtime.   Fluocinolone Acetonide Scalp 0.01 % Oil Apply 1 application topically daily as needed (scalp irritation.).   fluticasone 50 MCG/ACT nasal spray Commonly known as: FLONASE Place 1 spray into both nostrils daily.   Forteo 600 MCG/2.4ML Sopn Generic drug: Teriparatide (Recombinant) Inject 20 mcg subcutaneously daily   FreeStyle Freedom Lite w/Device Kit Use to check blood sugar 2 times per day dx code  E11.65   freestyle lancets Use as instructed to check blood sugar 2 times per day dx code E11.65   FREESTYLE LITE test strip Generic drug: glucose blood USE AS DIRECTED THREE TIMES DAILY   HYDROcodone-acetaminophen 5-325 MG tablet Commonly known as: NORCO/VICODIN Take 1 tablet by mouth every 4 (four) hours as needed for moderate pain.   hydrocortisone 2.5 % cream Apply 1 application topically 3 (three) times daily as needed (skin irritation (legs & arms)).   insulin aspart 100 UNIT/ML FlexPen Commonly known as: NovoLOG FlexPen INJECT 6-8 UNITS UNDER THE SKIN THREE TIMES  DAILY BEFORE MEALS. DX:E11.65   ketoconazole 2 % shampoo Commonly known as: Nizoral Apply 1 application topically 2 (two) times a week.   levothyroxine 50 MCG tablet Commonly known as: SYNTHROID Take 1 tablet (50 mcg total) by mouth daily. What changed: when to take this   lidocaine 5 % Commonly known as: LIDODERM Place 1 patch onto the skin daily as needed (back pain).   memantine 10 MG tablet Commonly known as: NAMENDA Take 1 tablet (10 mg total) by mouth 2 (two) times daily.   midodrine 10 MG tablet Commonly known as: PROAMATINE Take one pill prior to hemodialysis on MWF What changed:  how much to take how to take this when to take this   omeprazole 40 MG capsule Commonly known as: PRILOSEC Take 1 capsule (40 mg total) by mouth daily.   Pen Needles 32G X 4 MM Misc 1 each by Does not apply route 3 (three) times daily. USE PEN NEEDLES TO INJECT INSULIN THREE TIMES DAILY.   sevelamer carbonate 800 MG tablet Commonly known as: RENVELA Take 1,600-2,400 mg by mouth See admin instructions. Take 2,400 mg by mouth three times a day with meals and 1,600 mg with snacks on Sun/Tues/Thurs/Sat and 2,400 mg with breakfast and dinner and nothing with snacks on Mon/Wed/Fri   simvastatin 20 MG tablet Commonly known as: ZOCOR Take 20 mg by mouth at bedtime.   Theratears 0.25 % Soln Generic drug:  Carboxymethylcellulose Sodium Place 1 drop into both eyes as needed (for dryness).   Tradjenta 5 MG Tabs tablet Generic drug: linagliptin TAKE 1 TABLET DAILY What changed: how much to take        Allergies:  Allergies  Allergen Reactions   Ambien [Zolpidem Tartrate] Other (See Comments)    Hallucinations and "felt crazy"    Penicillins Rash and Hives    Has patient had a PCN reaction causing immediate rash, facial/tongue/throat swelling, SOB or lightheadedness with hypotension: Yes Has patient had a PCN reaction causing severe rash involving mucus membranes or skin necrosis: Yes Has patient had a PCN reaction that required hospitalization: No Has patient had a PCN reaction occurring within the last 10 years: No If all of the above answers are "NO", then may proceed with Cephalosporin use.  Other reaction(s): HIVES    Past Medical History:  Diagnosis Date   Allergy    Alzheimer's disease ([place]) 01/19/2021   Anemia    Arthritis    Cataract    bil cateracts removed   Coronary artery disease    Dementia arising in the senium and presenium ([place])    Diabetes mellitus    Type 2   Diverticulitis    ED (erectile dysfunction)    Elevated homocysteine    ESRD (end stage renal disease) on dialysis ([place]) 03/2015   M-W-F dialysis   GERD (gastroesophageal reflux disease)    pepto    Gout    Headache, unspecified 02/25/2015   Hiatal hernia    Hyperlipidemia    Hypertension    Hypothyroidism    Pneumonia    PVD (peripheral vascular disease) ([place])    has plastic aorta   Renal insufficiency    Seasonal allergies    Sleep apnea    does not wear c-pap    Past Surgical History:  Procedure Laterality Date   ANGIOPLASTY Right 01/26/2021   Procedure: ANGIOPLASTY RIGHT SUBCLAVIAN VEIN;  Surgeon: Serafina Mitchell, MD;  Location: [facility] OR;  Service: Vascular;  Laterality: Right;   aortobifemoral bypass  AV FISTULA PLACEMENT Left 12/01/2013   Procedure: ARTERIOVENOUS (AV) FISTULA  CREATION- LEFT BRACHIOCEPHALIC;  Surgeon: Angelia Mould, MD;  Location: [facility];  Service: Vascular;  Laterality: Left;   [place] Right 07/27/2014   Procedure: [place];  Surgeon: Angelia Mould, MD;  Location: [facility];  Service: Vascular;  Laterality: Right;   BRAIN SURGERY  07/22/2018   BREAST SURGERY     left - granulomatous mastitis   BURR HOLE Bilateral 07/22/2018   Procedure: BILATERAL BURR HOLES;  Surgeon: Kristeen Miss, MD;  Location: [facility];  Service: Neurosurgery;  Laterality: Bilateral;   COLONOSCOPY     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 ([place] HX)  1992   ENTEROSCOPY N/A 06/03/2018   Procedure: ENTEROSCOPY;  Surgeon: Lavena Bullion, DO;  Location: [facility] ENDOSCOPY;  Service: Gastroenterology;  Laterality: N/A;   ENTEROSCOPY N/A 11/20/2018   Procedure: ENTEROSCOPY;  Surgeon: Rush Landmark Telford Nab., MD;  Location: [facility];  Service: Gastroenterology;  Laterality: N/A;   ENTEROSCOPY N/A 12/03/2019   Procedure: ENTEROSCOPY;  Surgeon: Lavena Bullion, DO;  Location: [facility] ENDOSCOPY;  Service: Gastroenterology;  Laterality: N/A;  push enteroscopy   EYE SURGERY Bilateral    cataracts   FISTULOGRAM Right 01/26/2021   Procedure: FISTULOGRAM RIGHT;  Surgeon: Serafina Mitchell, MD;  [facility];  Service: Vascular;  Laterality: Right;   HEMODIALYSIS INPATIENT  01/17/2018       HOT HEMOSTASIS N/A 06/03/2018   Procedure: HOT HEMOSTASIS (ARGON PLASMA COAGULATION/BICAP);  Surgeon: Lavena Bullion, DO;  Location: [facility] ENDOSCOPY;  Service: Gastroenterology;  Laterality: N/A;   HOT HEMOSTASIS N/A 11/20/2018   Procedure: HOT HEMOSTASIS (ARGON PLASMA COAGULATION/BICAP);  Surgeon: Irving Copas., MD;  Location: [facility];  Service: Gastroenterology;  Laterality: N/A;   HOT HEMOSTASIS N/A 12/03/2019   Procedure: HOT HEMOSTASIS (ARGON PLASMA COAGULATION/BICAP);  Surgeon: Lavena Bullion, DO;  Location: [facility] ENDOS[facility];  Service:  Gastroenterology;  Laterality: N/A;   INTRAMEDULLARY (IM) NAIL INTERTROCHANTERIC Right 11/08/2020   Procedure: INTRAMEDULLARY (IM) NAIL INTERTROCHANTRIC;  Surgeon: Erle Crocker, MD;  Location: [facility];  Service: Orthopedics;  Laterality: Right;   REVISION OF ARTERIOVENOUS GORETEX GRAFT Right 09/01/2019   Procedure: REVISION OF ARTERIOVENOUS FISTULA RIGHT ARM;  Surgeon: Serafina Mitchell, MD;  Location: [facility];  Service: Vascular;  Laterality: Right;   REVISON OF ARTERIOVENOUS FISTULA Left 02/09/2014   Procedure: REVISON OF LEFT ARTERIOVENOUS FISTULA - RESECTION OF RENDUNDANT VEIN;  Surgeon: Angelia Mould, MD;  Location: [facility];  Service: Vascular;  Laterality: Left;   REVISON OF ARTERIOVENOUS FISTULA Right 01/26/2021   Procedure: REVISON OF ARTERIOVENOUS FISTULA RIGHT;  Surgeon: Serafina Mitchell, MD;  Location: [facility];  Service: Vascular;  Laterality: Right;   SBO with lysis adhesions     SHUNTOGRAM Left 04/19/2014   Procedure: FISTULOGRAM;  Surgeon: Angelia Mould, MD;  Location[facility] C[facility] LAB;  Service: Cardiovascular;  Laterality: Left;   UNILATERAL UPPER EXTREMEITY ANGIOGRAM N/A 07/12/2014   Procedure: UNILATERAL UPPER Anselmo Rod;  Surgeon: Angelia Mould, MD;  Location: [facility] CATH LAB;  Service: Cardiovascular;  Laterality: N/A;    Family History  Problem Relation Age of Onset   Aneurysm Mother        brain   Heart disease Father    Stroke Father    Hypertension Father    Diabetes Father    Dementia Neg Hx    Colon cancer Neg Hx    Esophageal cancer Neg Hx  Pancreatic cancer Neg Hx    Prostate cancer Neg Hx    Rectal cancer Neg Hx    Stomach cancer Neg Hx     Social History:  reports that he quit smoking about 26 years ago. His smoking use included cigarettes. He has never used smokeless tobacco. He reports that he does not drink alcohol and does not use drugs.    Review of Systems      OSTEOPOROSIS:  He has had a history of 2 vertebral  fractures He had rib fractures after his fall in October 2019 His T score at the radius was -4.6 and unable to read his hips and spine on the bone densitometry  He had been on Prolia since 5/19 and last injection was in 05/19/20 After discussion with nephrologist even though he has mild secondary hyperparathyroidism he has been on FORTEO since 7/22 He is taking it daily without side effects  Calcium has been variable, also followed by nephrologist Now normal consistently  PTH values not available recently  Lab Results  Component Value Date   PTH 154 (H) 10/06/2014   CALCIUM 9.2 05/09/2021   CAION 1.11 (L) 01/26/2021   PHOS 4.1 11/16/2020     Lipid history: He has been treated with Simvastatin 20 mg  by PCP, results as follows    Lab Results  Component Value Date   CHOL 81 05/09/2021   HDL 49.10 05/09/2021   [place] 15 05/09/2021   TRIG 85.0 05/09/2021   CHOLHDL 2 05/09/2021          He has ESRD on dialysis    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  Is on 50 mcg levothyroxine with normal TSH levels, this has been prescribed by PCP  Lab Results  Component Value Date   TSH 1.90 03/29/2020      Physical Examination:  BP 138/62   Pulse 66   Ht 5' 11"  (1.803 m)   Wt 145 lb 9.6 oz (66 kg)   SpO2 98%   BMI 20.31 kg/m      ASSESSMENT:  Diabetes type 2  See history of present illness for discussion of his current management, blood sugar patterns and problems identified  Currently not on any treatment   His fructosamine is 306 compared to 277, A1c again falsely low However since his home blood sugars are quite normal even after meals he likely does not need any pharmacological treatment at this time  Home blood sugars recently have been normal except for slightly higher reading of 154 late afternoon even with no treatment   OSTEOPOROSIS with history of fracture: Taking Forteo and he will  continue this for total of 18 months  PLAN:   Diet management only for his diabetes Will continue to follow fructosamine levels   There are no Patient Instructions on file for this visit.     Elayne Snare 05/11/2021, 3:45 PM   Note: This office note was prepared with Dragon voice recognition system technology. Any transcriptional errors that result from this process are unintentional.

## 2021-05-12 DIAGNOSIS — E876 Hypokalemia: Secondary | ICD-10-CM | POA: Diagnosis not present

## 2021-05-12 DIAGNOSIS — D509 Iron deficiency anemia, unspecified: Secondary | ICD-10-CM | POA: Diagnosis not present

## 2021-05-12 DIAGNOSIS — N186 End stage renal disease: Secondary | ICD-10-CM | POA: Diagnosis not present

## 2021-05-12 DIAGNOSIS — N2581 Secondary hyperparathyroidism of renal origin: Secondary | ICD-10-CM | POA: Diagnosis not present

## 2021-05-12 DIAGNOSIS — Z992 Dependence on renal dialysis: Secondary | ICD-10-CM | POA: Diagnosis not present

## 2021-05-15 ENCOUNTER — Ambulatory Visit (INDEPENDENT_AMBULATORY_CARE_PROVIDER_SITE_OTHER): Payer: Medicare Other | Admitting: Surgery

## 2021-05-15 ENCOUNTER — Encounter: Payer: Self-pay | Admitting: Surgery

## 2021-05-15 VITALS — BP 113/72 | HR 72 | Temp 98.1°F | Resp 20 | Ht 71.0 in | Wt 147.0 lb

## 2021-05-15 DIAGNOSIS — Z992 Dependence on renal dialysis: Secondary | ICD-10-CM

## 2021-05-15 DIAGNOSIS — E876 Hypokalemia: Secondary | ICD-10-CM | POA: Diagnosis not present

## 2021-05-15 DIAGNOSIS — N186 End stage renal disease: Secondary | ICD-10-CM | POA: Diagnosis not present

## 2021-05-15 DIAGNOSIS — D509 Iron deficiency anemia, unspecified: Secondary | ICD-10-CM | POA: Diagnosis not present

## 2021-05-15 DIAGNOSIS — N2581 Secondary hyperparathyroidism of renal origin: Secondary | ICD-10-CM | POA: Diagnosis not present

## 2021-05-15 NOTE — Progress Notes (Signed)
[facility] [place]  Patient name: [name] MRN: [id number] DOB: [birth year] Sex: male   REASON FOR VISIT:    Follow up  HISOTRY OF PRESENT ILLNESS:    [name] is a 80 y.o. male who on 01/26/2021 underwent revision of his right brachiocephalic fistula and central venoplasty for significant ulceration.  Since I have last seen him, he has been treated for cellulitis of the right leg.  This has completely resolved.  He is currently using his catheter for dialysis.   PAST MEDICAL HISTORY:   Past Medical History:  Diagnosis Date   Allergy    Alzheimer's disease ([place]) 01/19/2021   Anemia    Arthritis    Cataract    bil cateracts removed   Coronary artery disease    Dementia arising in the senium and presenium ([place])    Diabetes mellitus    Type 2   Diverticulitis    ED (erectile dysfunction)    Elevated homocysteine    ESRD (end stage renal disease) on dialysis ([place]) 03/2015   M-W-F dialysis   GERD (gastroesophageal reflux disease)    pepto    Gout    Headache, unspecified 02/25/2015   Hiatal hernia    Hyperlipidemia    Hypertension    Hypothyroidism    Pneumonia    PVD (peripheral vascular disease) ([facility])    has plastic aorta   Renal insufficiency    Seasonal allergies    Sleep apnea    does not wear c-pap     FAMILY HISTORY:   Family History  Problem Relation Age of Onset   Aneurysm Mother        brain   Heart disease Father    Stroke Father    Hypertension Father    Diabetes Father    Dementia Neg Hx    Colon cancer Neg Hx    Esophageal cancer Neg Hx    Pancreatic cancer Neg Hx    Prostate cancer Neg Hx    Rectal cancer Neg Hx    Stomach cancer Neg Hx     SOCIAL HISTORY:   Social History   Tobacco Use   Smoking status: Former    Types: Cigarettes    Quit date: 09/24/1994    Years since quitting: 26.6   Smokeless tobacco: Never  Substance Use Topics   Alcohol use: No     Alcohol/week: 0.0 standard drinks    Comment: Quit Oct. 1977 ("somewhat heavy")     ALLERGIES:   Allergies  Allergen Reactions   Ambien [Zolpidem Tartrate] Other (See Comments)    Hallucinations and "felt crazy"    Penicillins Rash and Hives    Has patient had a PCN reaction causing immediate rash, facial/tongue/throat swelling, SOB or lightheadedness with hypotension: Yes Has patient had a PCN reaction causing severe rash involving mucus membranes or skin necrosis: Yes Has patient had a PCN reaction that required hospitalization: No Has patient had a PCN reaction occurring within the last 10 years: No If all of the above answers are "NO", then may proceed with Cephalosporin use.  Other reaction(s): HIVES     CURRENT MEDICATIONS:   Current Outpatient Medications  Medication Sig Dispense Refill   acetaminophen (TYLENOL) 325 MG tablet Take 1-2 tablets (325-650 mg total) by mouth every 4 (four) hours as needed for mild pain.     albuterol (VENTOLIN HFA) 108 (90 Base) MCG/ACT inhaler Inhale 1 puff into the lungs daily as needed for wheezing  or shortness of breath.     allopurinol (ZYLOPRIM) 100 MG tablet Take 100 mg by mouth daily.     Blood Glucose Monitoring Suppl (FREESTYLE FREEDOM LITE) w/Device KIT Use to check blood sugar 2 times per day dx code E11.65 1 each 0   camphor-menthol (SARNA) lotion Apply 1 application topically 4 (four) times daily as needed for itching.      Carboxymethylcellulose Sodium (THERATEARS) 0.25 % SOLN Place 1 drop into both eyes as needed (for dryness).     cetirizine (ZYRTEC) 10 MG chewable tablet Chew 10 mg by mouth every Monday, Wednesday, and Friday.     clobetasol (TEMOVATE) 0.05 % external solution Apply 1 application topically 2 (two) times daily as needed (scalp irritation.).   0   donepezil (ARICEPT) 10 MG tablet Take 1 tablet (10 mg total) by mouth at bedtime. 90 tablet 3   FLUOCINOLONE ACETONIDE SCALP 0.01 % OIL Apply 1 application topically  daily as needed (scalp irritation.).   0   fluticasone (FLONASE) 50 MCG/ACT nasal spray Place 1 spray into both nostrils daily. 16 g 2   glucose blood (FREESTYLE LITE) test strip USE AS DIRECTED THREE TIMES DAILY 300 strip 2   HYDROcodone-acetaminophen (NORCO/VICODIN) 5-325 MG tablet Take 1 tablet by mouth every 4 (four) hours as needed for moderate pain. 20 tablet 0   hydrocortisone 2.5 % cream Apply 1 application topically 3 (three) times daily as needed (skin irritation (legs & arms)).      insulin aspart (NOVOLOG FLEXPEN) 100 UNIT/ML FlexPen INJECT 6-8 UNITS UNDER THE SKIN THREE TIMES DAILY BEFORE MEALS. DX:E11.65 15 mL 12   Insulin Pen Needle (PEN NEEDLES) 32G X 4 MM MISC 1 each by Does not apply route 3 (three) times daily. USE PEN NEEDLES TO INJECT INSULIN THREE TIMES DAILY. 100 each 12   ketoconazole (NIZORAL) 2 % shampoo Apply 1 application topically 2 (two) times a week. 120 mL 0   Lancets (FREESTYLE) lancets Use as instructed to check blood sugar 2 times per day dx code E11.65 100 each 3   levothyroxine (SYNTHROID, LEVOTHROID) 50 MCG tablet Take 1 tablet (50 mcg total) by mouth daily. (Patient taking differently: Take 50 mcg by mouth daily before breakfast.) 90 tablet 1   lidocaine (LIDODERM) 5 % Place 1 patch onto the skin daily as needed (back pain).      memantine (NAMENDA) 10 MG tablet Take 1 tablet (10 mg total) by mouth 2 (two) times daily. 180 tablet 3   midodrine (PROAMATINE) 10 MG tablet Take one pill prior to hemodialysis on MWF (Patient taking differently: Take 5 mg by mouth every Monday, Wednesday, and Friday. Take one pill prior to hemodialysis on MWF)     Nutritional Supplements (FEEDING SUPPLEMENT, NEPRO CARB STEADY,) LIQD Take 237 mLs by mouth in the morning and at bedtime.      omeprazole (PRILOSEC) 40 MG capsule Take 1 capsule (40 mg total) by mouth daily. 30 capsule 0   sevelamer carbonate (RENVELA) 800 MG tablet Take 1,600-2,400 mg by mouth See admin instructions. Take  2,400 mg by mouth three times a day with meals and 1,600 mg with snacks on Sun/Tues/Thurs/Sat and 2,400 mg with breakfast and dinner and nothing with snacks on Mon/Wed/Fri     simvastatin (ZOCOR) 20 MG tablet Take 20 mg by mouth at bedtime.     Teriparatide, Recombinant, (FORTEO) 600 MCG/2.4ML SOPN Inject 20 mcg subcutaneously daily 2.4 mL 3   TRADJENTA 5 MG TABS tablet TAKE 1 TABLET  DAILY (Patient taking differently: Take 5 mg by mouth daily.) 90 tablet 3   doxycycline (VIBRAMYCIN) 100 MG capsule Take 1 capsule (100 mg total) by mouth 2 (two) times daily. (Patient not taking: Reported on 05/16/2021) 20 capsule 0   No current facility-administered medications for this visit.    REVIEW OF SYSTEMS:   [X]  denotes positive finding, [ ]  denotes negative finding Cardiac  Comments:  Chest pain or chest pressure:    Shortness of breath upon exertion:    Short of breath when lying flat:    Irregular heart rhythm:        Vascular    Pain in calf, thigh, or hip brought on by ambulation:    Pain in feet at night that wakes you up from your sleep:     Blood clot in your veins:    Leg swelling:         Pulmonary    Oxygen at home:    Productive cough:     Wheezing:         Neurologic    Sudden weakness in arms or legs:     Sudden numbness in arms or legs:     Sudden onset of difficulty speaking or slurred speech:    Temporary loss of vision in one eye:     Problems with dizziness:         Gastrointestinal    Blood in stool:     Vomited blood:         Genitourinary    Burning when urinating:     Blood in urine:        Psychiatric    Major depression:         Hematologic    Bleeding problems:    Problems with blood clotting too easily:        Skin    Rashes or ulcers:        Constitutional    Fever or chills:      PHYSICAL EXAM:   Vitals:   05/16/21 0855  BP: 113/72  Pulse: 72  Resp: 20  Temp: 98.1 F (36.7 C)  SpO2: 98%  Weight: 147 lb (66.7 kg)  Height: 5' 11"   (1.803 m)    GENERAL: The patient is a well-nourished male, in no acute distress. The vital signs are documented above. CARDIAC: There is a regular rate and rhythm.  VASCULAR: Excellent thrill within right arm fistula.  No ulcerations. PULMONARY: Non-labored respirations MUSCULOSKELETAL: There are no major deformities or cyanosis. NEUROLOGIC: No focal weakness or paresthesias are detected. SKIN: There are no ulcers or rashes noted. PSYCHIATRIC: The patient has a normal affect.  STUDIES:   None  MEDICAL ISSUES:   End-stage renal disease: The right brachiocephalic fistula can be used for dialysis.  Hopefully we can get his catheter out shortly after using his fistula.  Lower extremity: The patient was treated for cellulitis in the right leg which has resolved.  With his underlying disease, I have encouraged him to perform daily examinations of his legs and to contact me if he has any significant issues    Annamarie Major, IV, MD, FACS [facility] [phone] Pager [phone]

## 2021-05-16 ENCOUNTER — Other Ambulatory Visit: Payer: Medicare Other | Admitting: Internal Medicine

## 2021-05-16 ENCOUNTER — Other Ambulatory Visit: Payer: Self-pay

## 2021-05-16 DIAGNOSIS — M8000XD Age-related osteoporosis with current pathological fracture, unspecified site, subsequent encounter for fracture with routine healing: Secondary | ICD-10-CM

## 2021-05-16 DIAGNOSIS — N186 End stage renal disease: Secondary | ICD-10-CM | POA: Diagnosis not present

## 2021-05-16 DIAGNOSIS — E119 Type 2 diabetes mellitus without complications: Secondary | ICD-10-CM

## 2021-05-16 DIAGNOSIS — I4891 Unspecified atrial fibrillation: Secondary | ICD-10-CM

## 2021-05-16 DIAGNOSIS — Z0000 Encounter for general adult medical examination without abnormal findings: Secondary | ICD-10-CM

## 2021-05-16 DIAGNOSIS — I959 Hypotension, unspecified: Secondary | ICD-10-CM | POA: Diagnosis not present

## 2021-05-16 DIAGNOSIS — Z992 Dependence on renal dialysis: Secondary | ICD-10-CM | POA: Diagnosis not present

## 2021-05-16 DIAGNOSIS — E538 Deficiency of other specified B group vitamins: Secondary | ICD-10-CM | POA: Diagnosis not present

## 2021-05-16 DIAGNOSIS — Z125 Encounter for screening for malignant neoplasm of prostate: Secondary | ICD-10-CM

## 2021-05-16 DIAGNOSIS — E1165 Type 2 diabetes mellitus with hyperglycemia: Secondary | ICD-10-CM

## 2021-05-16 DIAGNOSIS — E039 Hypothyroidism, unspecified: Secondary | ICD-10-CM

## 2021-05-16 DIAGNOSIS — I10 Essential (primary) hypertension: Secondary | ICD-10-CM

## 2021-05-17 DIAGNOSIS — E876 Hypokalemia: Secondary | ICD-10-CM | POA: Diagnosis not present

## 2021-05-17 DIAGNOSIS — D509 Iron deficiency anemia, unspecified: Secondary | ICD-10-CM | POA: Diagnosis not present

## 2021-05-17 DIAGNOSIS — N2581 Secondary hyperparathyroidism of renal origin: Secondary | ICD-10-CM | POA: Diagnosis not present

## 2021-05-17 DIAGNOSIS — Z992 Dependence on renal dialysis: Secondary | ICD-10-CM | POA: Diagnosis not present

## 2021-05-17 DIAGNOSIS — N186 End stage renal disease: Secondary | ICD-10-CM | POA: Diagnosis not present

## 2021-05-18 ENCOUNTER — Other Ambulatory Visit: Payer: Self-pay

## 2021-05-18 ENCOUNTER — Ambulatory Visit (INDEPENDENT_AMBULATORY_CARE_PROVIDER_SITE_OTHER): Payer: Medicare Other | Admitting: Internal Medicine

## 2021-05-18 VITALS — BP 130/60 | HR 83 | Ht 71.0 in | Wt 144.0 lb

## 2021-05-18 DIAGNOSIS — D7589 Other specified diseases of blood and blood-forming organs: Secondary | ICD-10-CM

## 2021-05-18 DIAGNOSIS — Z8739 Personal history of other diseases of the musculoskeletal system and connective tissue: Secondary | ICD-10-CM

## 2021-05-18 DIAGNOSIS — N186 End stage renal disease: Secondary | ICD-10-CM

## 2021-05-18 DIAGNOSIS — E118 Type 2 diabetes mellitus with unspecified complications: Secondary | ICD-10-CM

## 2021-05-18 DIAGNOSIS — E119 Type 2 diabetes mellitus without complications: Secondary | ICD-10-CM

## 2021-05-18 DIAGNOSIS — Z0000 Encounter for general adult medical examination without abnormal findings: Secondary | ICD-10-CM

## 2021-05-18 DIAGNOSIS — I10 Essential (primary) hypertension: Secondary | ICD-10-CM | POA: Diagnosis not present

## 2021-05-18 DIAGNOSIS — R413 Other amnesia: Secondary | ICD-10-CM | POA: Diagnosis not present

## 2021-05-18 DIAGNOSIS — Z992 Dependence on renal dialysis: Secondary | ICD-10-CM | POA: Diagnosis not present

## 2021-05-18 DIAGNOSIS — Z87820 Personal history of traumatic brain injury: Secondary | ICD-10-CM | POA: Diagnosis not present

## 2021-05-18 DIAGNOSIS — E538 Deficiency of other specified B group vitamins: Secondary | ICD-10-CM | POA: Diagnosis not present

## 2021-05-18 DIAGNOSIS — E039 Hypothyroidism, unspecified: Secondary | ICD-10-CM

## 2021-05-18 DIAGNOSIS — R54 Age-related physical debility: Secondary | ICD-10-CM | POA: Diagnosis not present

## 2021-05-18 MED ORDER — CYANOCOBALAMIN 1000 MCG/ML IJ SOLN: 1000.0000 ug | INTRAMUSCULAR | 0 refills | Status: DC

## 2021-05-18 MED ORDER — CYANOCOBALAMIN 1000 MCG/ML IJ SOLN
1000.0000 ug | Freq: Once | INTRAMUSCULAR | Status: AC
  Administered 2021-05-18: 1000 ug via INTRAMUSCULAR

## 2021-05-18 NOTE — Progress Notes (Signed)
Subjective:    Patient ID: [name], male    DOB: [birth year], 80 y.o.   MRN: [id number]  HPI 80 year old Male for health maintenance exam, [employer] wellness and evaluation of medical issues.  He has a history of chronic kidney disease and is a dialysis patient.  He has insulin-dependent diabetes mellitus and is followed by Dr. Dwyane Dee, endocrinologist.  He has mild memory loss treated with Aricept.  He receives Prolia for osteoporosis.  Has chronic dermatitis treated with Temovate and sometimes takes Vistaril orally for chronic itching.  History of hypothyroidism treated with thyroid replacement medication.  He is followed closely at [facility].  History of kyphoplasty L3 and L5 in the remote past secondary to a fall.  He had endplate disruption of L1 and L2 without frank compression in April 2019 due to a fall  He has issues ambulating.  Has received home PT.  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  History of gout, hyperlipidemia, hypothyroidism, allergic rhinitis.  History of peripheral vascular disease.  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  He had bilateral bur  holes placed by Dr. Ellene Route and went to rehab for period of time.  He also had fractured ribs.  All of this was related to a fall.  Issue with GI bleeding early October 2019.  History of B12 deficiency.  For a while was receiving B12 injections monthly but apparently that inadvertently got discontinued and I am restarting that.  History of Herpes zoster 2007.  Cardiolite study done by Dr. Pernell Dupre in 2003 was negative.  History of small bowel obstruction treated with surgical lysis of adhesions in 1996.  Sometimes gets diverticulitis and responds to Cipro and Flagyl. Admitted to the hospital February 14-23, 2022 with right intertrochanteric femur fracture. Subsequently went to rehab facility before getting back  home.  Social history: He is retired from [employer].  He used to work part-time at an [employer] on [address].  He is married.  Wife works for the [employer].  1 adopted daughter.  Non-smoker.  Social alcohol consumption.  Family history: Father died of congestive heart failure at age 57.  Mother died at age 58 of a brain aneurysm.  He is an only child.  He is receiving Palliative care services.  Review of Systems see above-his health continues to be fragile.  He had apparent cellulitis of his lower leg recently in July treated with antibiotics     Objective:   Physical Exam Blood pressure 130/60 pulse 83 pulse oximetry 98% weight 144 pounds BMI 20.08.  Last year he weighed 168 pounds.  Therefore there has been a 24 pound weight loss in the past year.  Skin: Warm and dry.  Chest clear.  Cardiac exam regular rate and rhythm.  Abdomen no appreciable hepatosplenomegaly or masses.  He is alert but does not know date of week month or year.  His wife is supportive and accompanies him today.  Prostate exam reveals no masses.  No significant lower extremity edema.       Assessment & Plan:  Dementia-restarting B12 injections with history of B12 deficiency.  This inadvertently got stopped over the past year or so.  I do not expect it to help much with this dementia which I think stems back to his head injury which necessitated neurosurgery by Dr. Ellene Route.  Chronic kidney disease-end-stage requires dialysis  GE reflux treated with Prilosec  Hyperlipidemia treated with Zocor  Hypothyroidism treated with thyroid replacement medication  Osteoporosis that has been treated with Prolia  History of head injury secondary to a fall  History of gout treated with allopurinol  Type 2 diabetes mellitus followed closely by Dr. Dwyane Dee  Secondary hyperparathyroidism  Essential hypertension  History of lumbar compression fracture secondary to a fall  Plan: He is to have  annual flu vaccine.  Recommend COVID booster this Fall.  Continue palliative care services and home health services.   Subjective:   Patient presents for Medicare Annual/Subsequent preventive examination.  Review Past Medical/Family/Social:   Risk Factors  Current exercise habits: just PT Dietary issues discussed: yes with wife  Cardiac risk factors:Hyperlipidemia and DM  Depression Screen  (Note: if answer to either of the following is "Yes", a more complete depression screening is indicated)   Over the past two weeks, have you felt down, depressed or hopeless? occasionally Over the past two weeks, have you felt little interest or pleasure in doing things? No Have you lost interest or pleasure in daily life? A little  Do you often feel hopeless? No Do you cry easily over simple problems? No   Activities of Daily Living  In your present state of health, do you have any difficulty performing the following activities?:   Driving? No longer drives Managing money? No longer does this Feeding yourself? No  Getting from bed to chair? yes Climbing a flight of stairs? Not able Preparing food and eating?: meals prepared for him Bathing or showering? Needs help Getting dressed: needs help Getting to the toilet? Needs help Using the toilet: needs help Moving around from place to place: needs help In the past year have you fallen or had a near fall?:yes Are you sexually active? No  Do you have more than one partner? No   Hearing Difficulties: Do you often ask people to speak up or repeat themselves? yes Do you experience ringing or noises in your ears? No  Do you have difficulty understanding soft or whispered voices? yes Do you feel that you have a problem with memory? yes Do you often misplace items? yes   Home Safety:  Do you have a smoke alarm at your residence? Yes Do you have grab bars in the bathroom? Do you have throw rugs in your house?   Cognitive Testing   Alert? Yes Normal Appearance?Yes  Oriented to person? Yes Place? Yes  Time? no Recall of three objects? no Can perform simple calculations? no Displays appropriate judgment? mostly Can read the correct time from a watch face?Yes   List the Names of Other Physician/Practitioners you currently use:  See referral list for the physicians patient is currently seeing.  [facility]  Dr. Dwyane Dee   Review of Systems: See above   Objective:     General appearance: Appears stated age and frail Head: Normocephalic, without obvious abnormality, atraumatic  Eyes: conj clear, EOMi PEERLA  Ears: normal TM's and external ear canals both ears  Nose: Nares normal. Septum midline. Mucosa normal. No drainage or sinus tenderness.  Throat: lips, mucosa, and tongue normal; teeth and gums normal  Neck: no adenopathy, no carotid bruit, no JVD, supple, symmetrical, trachea midline and thyroid not enlarged, symmetric, no tenderness/mass/nodules  No CVA tenderness.  Lungs: clear to auscultation bilaterally   Heart: regular rate and rhythm, S1, S2 normal, no murmur, click, rub or gallop  Abdomen: soft, nontender without obvious masses. No pitting  edema of the legs Skin: Skin color, texture, turgor normal. No rashes or lesions  Lymph nodes: Cervical, supraclavicular, and axillary nodes normal.  Neurologic: CN 2 -12 Normal, Normal symmetric reflexes.  Not able to ambulate without assistance Psych: Alert but not oriented to day of week month and year, Mood appears stable.    Assessment:    Annual wellness medicare exam   Plan:    During the course of the visit the patient was educated and counseled about appropriate screening and preventive services including:   Recommend flu vaccine and COVID booster this fall     Patient Instructions (the written plan) was given to the patient.  Medicare Attestation  I have personally reviewed:  The patient's medical and social history  Their  use of alcohol, tobacco or illicit drugs  Their current medications and supplements  The patient's functional ability including ADLs,fall risks, home safety risks, cognitive, and hearing and visual impairment  Diet and physical activities  Evidence for depression or mood disorders  The patient's weight, height, BMI, and visual acuity have been recorded in the chart. I have made referrals, counseling, and provided education to the patient based on review of the above and I have provided the patient with a written personalized care plan for preventive services.

## 2021-05-19 DIAGNOSIS — E876 Hypokalemia: Secondary | ICD-10-CM | POA: Diagnosis not present

## 2021-05-19 DIAGNOSIS — N2581 Secondary hyperparathyroidism of renal origin: Secondary | ICD-10-CM | POA: Diagnosis not present

## 2021-05-19 DIAGNOSIS — Z992 Dependence on renal dialysis: Secondary | ICD-10-CM | POA: Diagnosis not present

## 2021-05-19 DIAGNOSIS — D509 Iron deficiency anemia, unspecified: Secondary | ICD-10-CM | POA: Diagnosis not present

## 2021-05-19 DIAGNOSIS — N186 End stage renal disease: Secondary | ICD-10-CM | POA: Diagnosis not present

## 2021-05-19 LAB — CBC WITH DIFFERENTIAL/PLATELET
Absolute Monocytes: 607 cells/uL (ref 200–950)
Basophils Absolute: 59 cells/uL (ref 0–200)
Basophils Relative: 0.8 %
Eosinophils Absolute: 318 cells/uL (ref 15–500)
Eosinophils Relative: 4.3 %
HCT: 33.3 % — ABNORMAL LOW (ref 38.5–50.0)
Hemoglobin: 10.5 g/dL — ABNORMAL LOW (ref 13.2–17.1)
Lymphs Abs: 2649 cells/uL (ref 850–3900)
MCH: 32.7 pg (ref 27.0–33.0)
MCHC: 31.5 g/dL — ABNORMAL LOW (ref 32.0–36.0)
MCV: 103.7 fL — ABNORMAL HIGH (ref 80.0–100.0)
MPV: 10.5 fL (ref 7.5–12.5)
Monocytes Relative: 8.2 %
Neutro Abs: 3767 cells/uL (ref 1500–7800)
Neutrophils Relative %: 50.9 %
Platelets: 160 10*3/uL (ref 140–400)
RBC: 3.21 10*6/uL — ABNORMAL LOW (ref 4.20–5.80)
RDW: 15.9 % — ABNORMAL HIGH (ref 11.0–15.0)
Total Lymphocyte: 35.8 %
WBC: 7.4 10*3/uL (ref 3.8–10.8)

## 2021-05-19 LAB — COMPLETE METABOLIC PANEL WITH GFR
AG Ratio: 1.4 (calc) (ref 1.0–2.5)
ALT: 9 U/L (ref 9–46)
AST: 21 U/L (ref 10–35)
Albumin: 3.6 g/dL (ref 3.6–5.1)
Alkaline phosphatase (APISO): 202 U/L — ABNORMAL HIGH (ref 35–144)
BUN/Creatinine Ratio: 4 (calc) — ABNORMAL LOW (ref 6–22)
BUN: 22 mg/dL (ref 7–25)
CO2: 29 mmol/L (ref 20–32)
Calcium: 9.7 mg/dL (ref 8.6–10.3)
Chloride: 102 mmol/L (ref 98–110)
Creat: 5.46 mg/dL — ABNORMAL HIGH (ref 0.70–1.28)
Globulin: 2.5 g/dL (calc) (ref 1.9–3.7)
Glucose, Bld: 80 mg/dL (ref 65–99)
Potassium: 4.2 mmol/L (ref 3.5–5.3)
Sodium: 144 mmol/L (ref 135–146)
Total Bilirubin: 0.4 mg/dL (ref 0.2–1.2)
Total Protein: 6.1 g/dL (ref 6.1–8.1)
eGFR: 10 mL/min/{1.73_m2} — ABNORMAL LOW (ref >=60)

## 2021-05-19 LAB — PSA: PSA: 0.06 ng/mL (ref <=4.00)

## 2021-05-19 LAB — TSH: TSH: 2.94 mIU/L (ref 0.40–4.50)

## 2021-05-19 LAB — TEST AUTHORIZATION

## 2021-05-19 LAB — VITAMIN B12: Vitamin B-12: 529 pg/mL (ref 200–1100)

## 2021-05-22 DIAGNOSIS — N2581 Secondary hyperparathyroidism of renal origin: Secondary | ICD-10-CM | POA: Diagnosis not present

## 2021-05-22 DIAGNOSIS — E876 Hypokalemia: Secondary | ICD-10-CM | POA: Diagnosis not present

## 2021-05-22 DIAGNOSIS — Z992 Dependence on renal dialysis: Secondary | ICD-10-CM | POA: Diagnosis not present

## 2021-05-22 DIAGNOSIS — N186 End stage renal disease: Secondary | ICD-10-CM | POA: Diagnosis not present

## 2021-05-22 DIAGNOSIS — D509 Iron deficiency anemia, unspecified: Secondary | ICD-10-CM | POA: Diagnosis not present

## 2021-05-24 DIAGNOSIS — D509 Iron deficiency anemia, unspecified: Secondary | ICD-10-CM | POA: Diagnosis not present

## 2021-05-24 DIAGNOSIS — Z992 Dependence on renal dialysis: Secondary | ICD-10-CM | POA: Diagnosis not present

## 2021-05-24 DIAGNOSIS — N2581 Secondary hyperparathyroidism of renal origin: Secondary | ICD-10-CM | POA: Diagnosis not present

## 2021-05-24 DIAGNOSIS — E876 Hypokalemia: Secondary | ICD-10-CM | POA: Diagnosis not present

## 2021-05-24 DIAGNOSIS — N186 End stage renal disease: Secondary | ICD-10-CM | POA: Diagnosis not present

## 2021-05-25 DIAGNOSIS — N186 End stage renal disease: Secondary | ICD-10-CM | POA: Diagnosis not present

## 2021-05-25 DIAGNOSIS — E1129 Type 2 diabetes mellitus with other diabetic kidney complication: Secondary | ICD-10-CM | POA: Diagnosis not present

## 2021-05-25 DIAGNOSIS — Z992 Dependence on renal dialysis: Secondary | ICD-10-CM | POA: Diagnosis not present

## 2021-05-26 DIAGNOSIS — E876 Hypokalemia: Secondary | ICD-10-CM | POA: Diagnosis not present

## 2021-05-26 DIAGNOSIS — Z992 Dependence on renal dialysis: Secondary | ICD-10-CM | POA: Diagnosis not present

## 2021-05-26 DIAGNOSIS — N2581 Secondary hyperparathyroidism of renal origin: Secondary | ICD-10-CM | POA: Diagnosis not present

## 2021-05-26 DIAGNOSIS — D509 Iron deficiency anemia, unspecified: Secondary | ICD-10-CM | POA: Diagnosis not present

## 2021-05-26 DIAGNOSIS — N186 End stage renal disease: Secondary | ICD-10-CM | POA: Diagnosis not present

## 2021-05-26 DIAGNOSIS — E1129 Type 2 diabetes mellitus with other diabetic kidney complication: Secondary | ICD-10-CM | POA: Diagnosis not present

## 2021-05-26 DIAGNOSIS — D631 Anemia in chronic kidney disease: Secondary | ICD-10-CM | POA: Diagnosis not present

## 2021-05-29 DIAGNOSIS — Z992 Dependence on renal dialysis: Secondary | ICD-10-CM | POA: Diagnosis not present

## 2021-05-29 DIAGNOSIS — D509 Iron deficiency anemia, unspecified: Secondary | ICD-10-CM | POA: Diagnosis not present

## 2021-05-29 DIAGNOSIS — E1129 Type 2 diabetes mellitus with other diabetic kidney complication: Secondary | ICD-10-CM | POA: Diagnosis not present

## 2021-05-29 DIAGNOSIS — N186 End stage renal disease: Secondary | ICD-10-CM | POA: Diagnosis not present

## 2021-05-29 DIAGNOSIS — N2581 Secondary hyperparathyroidism of renal origin: Secondary | ICD-10-CM | POA: Diagnosis not present

## 2021-05-31 DIAGNOSIS — Z992 Dependence on renal dialysis: Secondary | ICD-10-CM | POA: Diagnosis not present

## 2021-05-31 DIAGNOSIS — D509 Iron deficiency anemia, unspecified: Secondary | ICD-10-CM | POA: Diagnosis not present

## 2021-05-31 DIAGNOSIS — N2581 Secondary hyperparathyroidism of renal origin: Secondary | ICD-10-CM | POA: Diagnosis not present

## 2021-05-31 DIAGNOSIS — N186 End stage renal disease: Secondary | ICD-10-CM | POA: Diagnosis not present

## 2021-05-31 DIAGNOSIS — E1129 Type 2 diabetes mellitus with other diabetic kidney complication: Secondary | ICD-10-CM | POA: Diagnosis not present

## 2021-06-02 DIAGNOSIS — N186 End stage renal disease: Secondary | ICD-10-CM | POA: Diagnosis not present

## 2021-06-02 DIAGNOSIS — N2581 Secondary hyperparathyroidism of renal origin: Secondary | ICD-10-CM | POA: Diagnosis not present

## 2021-06-02 DIAGNOSIS — E1129 Type 2 diabetes mellitus with other diabetic kidney complication: Secondary | ICD-10-CM | POA: Diagnosis not present

## 2021-06-02 DIAGNOSIS — D509 Iron deficiency anemia, unspecified: Secondary | ICD-10-CM | POA: Diagnosis not present

## 2021-06-02 DIAGNOSIS — Z992 Dependence on renal dialysis: Secondary | ICD-10-CM | POA: Diagnosis not present

## 2021-06-05 DIAGNOSIS — Z992 Dependence on renal dialysis: Secondary | ICD-10-CM | POA: Diagnosis not present

## 2021-06-05 DIAGNOSIS — N2581 Secondary hyperparathyroidism of renal origin: Secondary | ICD-10-CM | POA: Diagnosis not present

## 2021-06-05 DIAGNOSIS — D509 Iron deficiency anemia, unspecified: Secondary | ICD-10-CM | POA: Diagnosis not present

## 2021-06-05 DIAGNOSIS — N186 End stage renal disease: Secondary | ICD-10-CM | POA: Diagnosis not present

## 2021-06-05 DIAGNOSIS — E1129 Type 2 diabetes mellitus with other diabetic kidney complication: Secondary | ICD-10-CM | POA: Diagnosis not present

## 2021-06-07 DIAGNOSIS — N186 End stage renal disease: Secondary | ICD-10-CM | POA: Diagnosis not present

## 2021-06-07 DIAGNOSIS — E1129 Type 2 diabetes mellitus with other diabetic kidney complication: Secondary | ICD-10-CM | POA: Diagnosis not present

## 2021-06-07 DIAGNOSIS — Z992 Dependence on renal dialysis: Secondary | ICD-10-CM | POA: Diagnosis not present

## 2021-06-07 DIAGNOSIS — N2581 Secondary hyperparathyroidism of renal origin: Secondary | ICD-10-CM | POA: Diagnosis not present

## 2021-06-07 DIAGNOSIS — D509 Iron deficiency anemia, unspecified: Secondary | ICD-10-CM | POA: Diagnosis not present

## 2021-06-09 DIAGNOSIS — D509 Iron deficiency anemia, unspecified: Secondary | ICD-10-CM | POA: Diagnosis not present

## 2021-06-09 DIAGNOSIS — Z992 Dependence on renal dialysis: Secondary | ICD-10-CM | POA: Diagnosis not present

## 2021-06-09 DIAGNOSIS — N2581 Secondary hyperparathyroidism of renal origin: Secondary | ICD-10-CM | POA: Diagnosis not present

## 2021-06-09 DIAGNOSIS — E1129 Type 2 diabetes mellitus with other diabetic kidney complication: Secondary | ICD-10-CM | POA: Diagnosis not present

## 2021-06-09 DIAGNOSIS — N186 End stage renal disease: Secondary | ICD-10-CM | POA: Diagnosis not present

## 2021-06-12 DIAGNOSIS — N2581 Secondary hyperparathyroidism of renal origin: Secondary | ICD-10-CM | POA: Diagnosis not present

## 2021-06-12 DIAGNOSIS — D509 Iron deficiency anemia, unspecified: Secondary | ICD-10-CM | POA: Diagnosis not present

## 2021-06-12 DIAGNOSIS — N186 End stage renal disease: Secondary | ICD-10-CM | POA: Diagnosis not present

## 2021-06-12 DIAGNOSIS — Z992 Dependence on renal dialysis: Secondary | ICD-10-CM | POA: Diagnosis not present

## 2021-06-12 DIAGNOSIS — E1129 Type 2 diabetes mellitus with other diabetic kidney complication: Secondary | ICD-10-CM | POA: Diagnosis not present

## 2021-06-14 ENCOUNTER — Telehealth: Payer: Self-pay

## 2021-06-14 DIAGNOSIS — N2581 Secondary hyperparathyroidism of renal origin: Secondary | ICD-10-CM | POA: Diagnosis not present

## 2021-06-14 DIAGNOSIS — N186 End stage renal disease: Secondary | ICD-10-CM | POA: Diagnosis not present

## 2021-06-14 DIAGNOSIS — D509 Iron deficiency anemia, unspecified: Secondary | ICD-10-CM | POA: Diagnosis not present

## 2021-06-14 DIAGNOSIS — Z992 Dependence on renal dialysis: Secondary | ICD-10-CM | POA: Diagnosis not present

## 2021-06-14 DIAGNOSIS — E1129 Type 2 diabetes mellitus with other diabetic kidney complication: Secondary | ICD-10-CM | POA: Diagnosis not present

## 2021-06-14 NOTE — Telephone Encounter (Signed)
Telephone message from [facility] requesting appointment for [facility] removal.   Spoke with Crystal and advised no record of [facility] inserting catheter. Crystal stated she realized catheter was placed by [facility] and to disregard request.

## 2021-06-16 DIAGNOSIS — N2581 Secondary hyperparathyroidism of renal origin: Secondary | ICD-10-CM | POA: Diagnosis not present

## 2021-06-16 DIAGNOSIS — Z992 Dependence on renal dialysis: Secondary | ICD-10-CM | POA: Diagnosis not present

## 2021-06-16 DIAGNOSIS — E1129 Type 2 diabetes mellitus with other diabetic kidney complication: Secondary | ICD-10-CM | POA: Diagnosis not present

## 2021-06-16 DIAGNOSIS — N186 End stage renal disease: Secondary | ICD-10-CM | POA: Diagnosis not present

## 2021-06-16 DIAGNOSIS — D509 Iron deficiency anemia, unspecified: Secondary | ICD-10-CM | POA: Diagnosis not present

## 2021-06-18 ENCOUNTER — Encounter: Payer: Self-pay | Admitting: Internal Medicine

## 2021-06-18 NOTE — Patient Instructions (Addendum)
Restart B12 injections with directions: 1 cc IM to be given at home monthly.  Continue current medications.  PSA and hemoglobin A1c stable.  TSH is normal.  B12 level is normal.  Follow-up here in a year or as needed.  We will continue to communicate as needed by telephone.  Palliative care is involved.  He will continue with dialysis services.  Needs to consider COVID booster and flu vaccine in the near future.

## 2021-06-19 DIAGNOSIS — E1129 Type 2 diabetes mellitus with other diabetic kidney complication: Secondary | ICD-10-CM | POA: Diagnosis not present

## 2021-06-19 DIAGNOSIS — N186 End stage renal disease: Secondary | ICD-10-CM | POA: Diagnosis not present

## 2021-06-19 DIAGNOSIS — Z992 Dependence on renal dialysis: Secondary | ICD-10-CM | POA: Diagnosis not present

## 2021-06-19 DIAGNOSIS — D509 Iron deficiency anemia, unspecified: Secondary | ICD-10-CM | POA: Diagnosis not present

## 2021-06-19 DIAGNOSIS — N2581 Secondary hyperparathyroidism of renal origin: Secondary | ICD-10-CM | POA: Diagnosis not present

## 2021-06-20 DIAGNOSIS — Z992 Dependence on renal dialysis: Secondary | ICD-10-CM | POA: Diagnosis not present

## 2021-06-20 DIAGNOSIS — N186 End stage renal disease: Secondary | ICD-10-CM | POA: Diagnosis not present

## 2021-06-20 DIAGNOSIS — Z452 Encounter for adjustment and management of vascular access device: Secondary | ICD-10-CM | POA: Diagnosis not present

## 2021-06-21 DIAGNOSIS — D509 Iron deficiency anemia, unspecified: Secondary | ICD-10-CM | POA: Diagnosis not present

## 2021-06-21 DIAGNOSIS — N2581 Secondary hyperparathyroidism of renal origin: Secondary | ICD-10-CM | POA: Diagnosis not present

## 2021-06-21 DIAGNOSIS — N186 End stage renal disease: Secondary | ICD-10-CM | POA: Diagnosis not present

## 2021-06-21 DIAGNOSIS — Z992 Dependence on renal dialysis: Secondary | ICD-10-CM | POA: Diagnosis not present

## 2021-06-21 DIAGNOSIS — E1129 Type 2 diabetes mellitus with other diabetic kidney complication: Secondary | ICD-10-CM | POA: Diagnosis not present

## 2021-06-23 DIAGNOSIS — N2581 Secondary hyperparathyroidism of renal origin: Secondary | ICD-10-CM | POA: Diagnosis not present

## 2021-06-23 DIAGNOSIS — N186 End stage renal disease: Secondary | ICD-10-CM | POA: Diagnosis not present

## 2021-06-23 DIAGNOSIS — Z992 Dependence on renal dialysis: Secondary | ICD-10-CM | POA: Diagnosis not present

## 2021-06-23 DIAGNOSIS — D509 Iron deficiency anemia, unspecified: Secondary | ICD-10-CM | POA: Diagnosis not present

## 2021-06-23 DIAGNOSIS — E1129 Type 2 diabetes mellitus with other diabetic kidney complication: Secondary | ICD-10-CM | POA: Diagnosis not present

## 2021-06-24 DIAGNOSIS — E1122 Type 2 diabetes mellitus with diabetic chronic kidney disease: Secondary | ICD-10-CM | POA: Diagnosis not present

## 2021-06-24 DIAGNOSIS — N186 End stage renal disease: Secondary | ICD-10-CM | POA: Diagnosis not present

## 2021-06-24 DIAGNOSIS — Z992 Dependence on renal dialysis: Secondary | ICD-10-CM | POA: Diagnosis not present

## 2021-06-26 DIAGNOSIS — N186 End stage renal disease: Secondary | ICD-10-CM | POA: Diagnosis not present

## 2021-06-26 DIAGNOSIS — T82898A Other specified complication of vascular prosthetic devices, implants and grafts, initial encounter: Secondary | ICD-10-CM | POA: Diagnosis not present

## 2021-06-26 DIAGNOSIS — Z992 Dependence on renal dialysis: Secondary | ICD-10-CM | POA: Diagnosis not present

## 2021-06-28 DIAGNOSIS — D631 Anemia in chronic kidney disease: Secondary | ICD-10-CM | POA: Diagnosis not present

## 2021-06-28 DIAGNOSIS — E1129 Type 2 diabetes mellitus with other diabetic kidney complication: Secondary | ICD-10-CM | POA: Diagnosis not present

## 2021-06-28 DIAGNOSIS — D509 Iron deficiency anemia, unspecified: Secondary | ICD-10-CM | POA: Diagnosis not present

## 2021-06-28 DIAGNOSIS — N186 End stage renal disease: Secondary | ICD-10-CM | POA: Diagnosis not present

## 2021-06-28 DIAGNOSIS — Z992 Dependence on renal dialysis: Secondary | ICD-10-CM | POA: Diagnosis not present

## 2021-06-28 DIAGNOSIS — N2581 Secondary hyperparathyroidism of renal origin: Secondary | ICD-10-CM | POA: Diagnosis not present

## 2021-06-28 DIAGNOSIS — E876 Hypokalemia: Secondary | ICD-10-CM | POA: Diagnosis not present

## 2021-06-30 DIAGNOSIS — D509 Iron deficiency anemia, unspecified: Secondary | ICD-10-CM | POA: Diagnosis not present

## 2021-06-30 DIAGNOSIS — N186 End stage renal disease: Secondary | ICD-10-CM | POA: Diagnosis not present

## 2021-06-30 DIAGNOSIS — Z992 Dependence on renal dialysis: Secondary | ICD-10-CM | POA: Diagnosis not present

## 2021-06-30 DIAGNOSIS — E876 Hypokalemia: Secondary | ICD-10-CM | POA: Diagnosis not present

## 2021-06-30 DIAGNOSIS — N2581 Secondary hyperparathyroidism of renal origin: Secondary | ICD-10-CM | POA: Diagnosis not present

## 2021-07-03 ENCOUNTER — Inpatient Hospital Stay (HOSPITAL_COMMUNITY)
Admission: EM | Admit: 2021-07-03 | Discharge: 2021-07-06 | DRG: 377 | Disposition: A | Discharge status: Home / Self Care | Payer: Medicare Other | Attending: Student in an Organized Health Care Education/Training Program | Admitting: Student in an Organized Health Care Education/Training Program

## 2021-07-03 ENCOUNTER — Emergency Department (HOSPITAL_COMMUNITY): Payer: Medicare Other

## 2021-07-03 ENCOUNTER — Other Ambulatory Visit: Payer: Self-pay

## 2021-07-03 DIAGNOSIS — E785 Hyperlipidemia, unspecified: Secondary | ICD-10-CM | POA: Diagnosis present

## 2021-07-03 DIAGNOSIS — F0280 Dementia in other diseases classified elsewhere without behavioral disturbance: Secondary | ICD-10-CM | POA: Diagnosis present

## 2021-07-03 DIAGNOSIS — K921 Melena: Secondary | ICD-10-CM | POA: Diagnosis present

## 2021-07-03 DIAGNOSIS — R1084 Generalized abdominal pain: Secondary | ICD-10-CM | POA: Diagnosis not present

## 2021-07-03 DIAGNOSIS — Z20822 Contact with and (suspected) exposure to covid-19: Secondary | ICD-10-CM | POA: Diagnosis present

## 2021-07-03 DIAGNOSIS — N186 End stage renal disease: Secondary | ICD-10-CM | POA: Diagnosis not present

## 2021-07-03 DIAGNOSIS — Z79899 Other long term (current) drug therapy: Secondary | ICD-10-CM

## 2021-07-03 DIAGNOSIS — K922 Gastrointestinal hemorrhage, unspecified: Secondary | ICD-10-CM | POA: Diagnosis not present

## 2021-07-03 DIAGNOSIS — K219 Gastro-esophageal reflux disease without esophagitis: Secondary | ICD-10-CM | POA: Diagnosis present

## 2021-07-03 DIAGNOSIS — H9190 Unspecified hearing loss, unspecified ear: Secondary | ICD-10-CM | POA: Diagnosis present

## 2021-07-03 DIAGNOSIS — Z8679 Personal history of other diseases of the circulatory system: Secondary | ICD-10-CM

## 2021-07-03 DIAGNOSIS — N261 Atrophy of kidney (terminal): Secondary | ICD-10-CM | POA: Diagnosis not present

## 2021-07-03 DIAGNOSIS — D539 Nutritional anemia, unspecified: Secondary | ICD-10-CM | POA: Diagnosis present

## 2021-07-03 DIAGNOSIS — I120 Hypertensive chronic kidney disease with stage 5 chronic kidney disease or end stage renal disease: Secondary | ICD-10-CM | POA: Diagnosis not present

## 2021-07-03 DIAGNOSIS — Z66 Do not resuscitate: Secondary | ICD-10-CM | POA: Diagnosis present

## 2021-07-03 DIAGNOSIS — N281 Cyst of kidney, acquired: Secondary | ICD-10-CM | POA: Diagnosis not present

## 2021-07-03 DIAGNOSIS — I700 Atherosclerosis of aorta: Secondary | ICD-10-CM | POA: Diagnosis not present

## 2021-07-03 DIAGNOSIS — Z833 Family history of diabetes mellitus: Secondary | ICD-10-CM

## 2021-07-03 DIAGNOSIS — D500 Iron deficiency anemia secondary to blood loss (chronic): Secondary | ICD-10-CM | POA: Diagnosis present

## 2021-07-03 DIAGNOSIS — I10 Essential (primary) hypertension: Secondary | ICD-10-CM | POA: Diagnosis not present

## 2021-07-03 DIAGNOSIS — K31811 Angiodysplasia of stomach and duodenum with bleeding: Principal | ICD-10-CM | POA: Diagnosis present

## 2021-07-03 DIAGNOSIS — E8889 Other specified metabolic disorders: Secondary | ICD-10-CM | POA: Diagnosis present

## 2021-07-03 DIAGNOSIS — M109 Gout, unspecified: Secondary | ICD-10-CM | POA: Diagnosis present

## 2021-07-03 DIAGNOSIS — R1030 Lower abdominal pain, unspecified: Secondary | ICD-10-CM | POA: Diagnosis not present

## 2021-07-03 DIAGNOSIS — Z794 Long term (current) use of insulin: Secondary | ICD-10-CM

## 2021-07-03 DIAGNOSIS — J90 Pleural effusion, not elsewhere classified: Secondary | ICD-10-CM | POA: Diagnosis not present

## 2021-07-03 DIAGNOSIS — Z7984 Long term (current) use of oral hypoglycemic drugs: Secondary | ICD-10-CM

## 2021-07-03 DIAGNOSIS — Z880 Allergy status to penicillin: Secondary | ICD-10-CM

## 2021-07-03 DIAGNOSIS — G4733 Obstructive sleep apnea (adult) (pediatric): Secondary | ICD-10-CM | POA: Diagnosis present

## 2021-07-03 DIAGNOSIS — Z888 Allergy status to other drugs, medicaments and biological substances status: Secondary | ICD-10-CM

## 2021-07-03 DIAGNOSIS — Z87891 Personal history of nicotine dependence: Secondary | ICD-10-CM

## 2021-07-03 DIAGNOSIS — E039 Hypothyroidism, unspecified: Secondary | ICD-10-CM | POA: Diagnosis present

## 2021-07-03 DIAGNOSIS — Z823 Family history of stroke: Secondary | ICD-10-CM

## 2021-07-03 DIAGNOSIS — N2581 Secondary hyperparathyroidism of renal origin: Secondary | ICD-10-CM | POA: Diagnosis present

## 2021-07-03 DIAGNOSIS — I444 Left anterior fascicular block: Secondary | ICD-10-CM | POA: Diagnosis present

## 2021-07-03 DIAGNOSIS — Z8719 Personal history of other diseases of the digestive system: Secondary | ICD-10-CM

## 2021-07-03 DIAGNOSIS — Z8249 Family history of ischemic heart disease and other diseases of the circulatory system: Secondary | ICD-10-CM

## 2021-07-03 DIAGNOSIS — R197 Diarrhea, unspecified: Secondary | ICD-10-CM

## 2021-07-03 DIAGNOSIS — G309 Alzheimer's disease, unspecified: Secondary | ICD-10-CM | POA: Diagnosis present

## 2021-07-03 DIAGNOSIS — K5521 Angiodysplasia of colon with hemorrhage: Secondary | ICD-10-CM | POA: Diagnosis present

## 2021-07-03 DIAGNOSIS — I2510 Atherosclerotic heart disease of native coronary artery without angina pectoris: Secondary | ICD-10-CM | POA: Diagnosis present

## 2021-07-03 DIAGNOSIS — Z992 Dependence on renal dialysis: Secondary | ICD-10-CM

## 2021-07-03 DIAGNOSIS — R1032 Left lower quadrant pain: Secondary | ICD-10-CM | POA: Diagnosis not present

## 2021-07-03 DIAGNOSIS — I4891 Unspecified atrial fibrillation: Secondary | ICD-10-CM | POA: Diagnosis present

## 2021-07-03 DIAGNOSIS — D631 Anemia in chronic kidney disease: Secondary | ICD-10-CM | POA: Diagnosis present

## 2021-07-03 DIAGNOSIS — R7989 Other specified abnormal findings of blood chemistry: Secondary | ICD-10-CM | POA: Diagnosis present

## 2021-07-03 DIAGNOSIS — R079 Chest pain, unspecified: Secondary | ICD-10-CM | POA: Diagnosis not present

## 2021-07-03 DIAGNOSIS — E1122 Type 2 diabetes mellitus with diabetic chronic kidney disease: Secondary | ICD-10-CM | POA: Diagnosis present

## 2021-07-03 DIAGNOSIS — E1169 Type 2 diabetes mellitus with other specified complication: Secondary | ICD-10-CM | POA: Diagnosis present

## 2021-07-03 DIAGNOSIS — N400 Enlarged prostate without lower urinary tract symptoms: Secondary | ICD-10-CM | POA: Diagnosis present

## 2021-07-03 DIAGNOSIS — R778 Other specified abnormalities of plasma proteins: Secondary | ICD-10-CM | POA: Diagnosis present

## 2021-07-03 DIAGNOSIS — K5730 Diverticulosis of large intestine without perforation or abscess without bleeding: Secondary | ICD-10-CM | POA: Diagnosis not present

## 2021-07-03 DIAGNOSIS — F0390 Unspecified dementia without behavioral disturbance: Secondary | ICD-10-CM | POA: Diagnosis present

## 2021-07-03 LAB — CBC WITH DIFFERENTIAL/PLATELET
Abs Immature Granulocytes: 0.05 10*3/uL (ref 0.00–0.07)
Abs Immature Granulocytes: 0.03 10*3/uL (ref 0.00–0.07)
Basophils Absolute: 0.1 10*3/uL (ref 0.0–0.1)
Basophils Absolute: 0.1 10*3/uL (ref 0.0–0.1)
Basophils Relative: 1 %
Basophils Relative: 1 %
Eosinophils Absolute: 0.2 10*3/uL (ref 0.0–0.5)
Eosinophils Absolute: 0.3 10*3/uL (ref 0.0–0.5)
Eosinophils Relative: 2 %
Eosinophils Relative: 3 %
HCT: 29.6 % — ABNORMAL LOW (ref 39.0–52.0)
HCT: 29.3 % — ABNORMAL LOW (ref 39.0–52.0)
Hemoglobin: 9.1 g/dL — ABNORMAL LOW (ref 13.0–17.0)
Hemoglobin: 9.3 g/dL — ABNORMAL LOW (ref 13.0–17.0)
Immature Granulocytes: 1 %
Immature Granulocytes: 0 %
Lymphocytes Relative: 16 %
Lymphocytes Relative: 27 %
Lymphs Abs: 1.4 10*3/uL (ref 0.7–4.0)
Lymphs Abs: 2.2 10*3/uL (ref 0.7–4.0)
MCH: 33.7 pg (ref 26.0–34.0)
MCH: 34.3 pg — ABNORMAL HIGH (ref 26.0–34.0)
MCHC: 30.7 g/dL (ref 30.0–36.0)
MCHC: 31.7 g/dL (ref 30.0–36.0)
MCV: 109.6 fL — ABNORMAL HIGH (ref 80.0–100.0)
MCV: 108.1 fL — ABNORMAL HIGH (ref 80.0–100.0)
Monocytes Absolute: 0.6 10*3/uL (ref 0.1–1.0)
Monocytes Absolute: 0.7 10*3/uL (ref 0.1–1.0)
Monocytes Relative: 7 %
Monocytes Relative: 9 %
Neutro Abs: 6.4 10*3/uL (ref 1.7–7.7)
Neutro Abs: 5.1 10*3/uL (ref 1.7–7.7)
Neutrophils Relative %: 73 %
Neutrophils Relative %: 60 %
Platelets: 203 10*3/uL (ref 150–400)
Platelets: 215 10*3/uL (ref 150–400)
RBC: 2.7 MIL/uL — ABNORMAL LOW (ref 4.22–5.81)
RBC: 2.71 MIL/uL — ABNORMAL LOW (ref 4.22–5.81)
RDW: 18.2 % — ABNORMAL HIGH (ref 11.5–15.5)
RDW: 17.9 % — ABNORMAL HIGH (ref 11.5–15.5)
WBC: 8.7 10*3/uL (ref 4.0–10.5)
WBC: 8.4 10*3/uL (ref 4.0–10.5)
nRBC: 0.2 % (ref 0.0–0.2)
nRBC: 0.2 % (ref 0.0–0.2)

## 2021-07-03 LAB — COMPREHENSIVE METABOLIC PANEL
ALT: 6 U/L (ref 0–44)
AST: 27 U/L (ref 15–41)
Albumin: 2.9 g/dL — ABNORMAL LOW (ref 3.5–5.0)
Alkaline Phosphatase: 218 U/L — ABNORMAL HIGH (ref 38–126)
Anion gap: 13 (ref 5–15)
BUN: 40 mg/dL — ABNORMAL HIGH (ref 8–23)
CO2: 25 mmol/L (ref 22–32)
Calcium: 9.1 mg/dL (ref 8.9–10.3)
Chloride: 102 mmol/L (ref 98–111)
Creatinine, Ser: 8.66 mg/dL — ABNORMAL HIGH (ref 0.61–1.24)
GFR, Estimated: 6 mL/min — ABNORMAL LOW (ref >60)
Glucose, Bld: 110 mg/dL — ABNORMAL HIGH (ref 70–99)
Potassium: 4.4 mmol/L (ref 3.5–5.1)
Sodium: 140 mmol/L (ref 135–145)
Total Bilirubin: 0.4 mg/dL (ref 0.3–1.2)
Total Protein: 5.8 g/dL — ABNORMAL LOW (ref 6.5–8.1)

## 2021-07-03 LAB — TROPONIN I (HIGH SENSITIVITY)
Troponin I (High Sensitivity): 36 ng/L — ABNORMAL HIGH (ref <18)
Troponin I (High Sensitivity): 37 ng/L — ABNORMAL HIGH (ref <18)

## 2021-07-03 LAB — POC OCCULT BLOOD, ED: Fecal Occult Bld: POSITIVE — AB

## 2021-07-03 LAB — RESP PANEL BY RT-PCR (FLU A&B, COVID) ARPGX2
Influenza A by PCR: NEGATIVE
Influenza B by PCR: NEGATIVE
SARS Coronavirus 2 by RT PCR: NEGATIVE

## 2021-07-03 LAB — LIPASE, BLOOD: Lipase: 37 U/L (ref 11–51)

## 2021-07-03 MED ORDER — IOHEXOL 9 MG/ML PO SOLN
ORAL | Status: AC
  Filled 2021-07-03: qty 1000

## 2021-07-03 MED ORDER — PANTOPRAZOLE SODIUM 40 MG IV SOLR
40.0000 mg | Freq: Once | INTRAVENOUS | Status: AC
  Administered 2021-07-03: 40 mg via INTRAVENOUS
  Filled 2021-07-03: qty 40

## 2021-07-03 MED ORDER — CHLORHEXIDINE GLUCONATE CLOTH 2 % EX PADS
6.0000 | MEDICATED_PAD | Freq: Every day | CUTANEOUS | Status: DC
  Administered 2021-07-05 – 2021-07-06 (×2): 6 via TOPICAL

## 2021-07-03 NOTE — ED Provider Notes (Signed)
Emergency Medicine Provider Triage Evaluation Note  [name] , a 80 y.o. male  was evaluated in triage.  Pt complains of achy crampy abdominal pain with diarrhea and nausea for 2 or 3 weeks.  He states it was worse yesterday in terms of pain and diarrhea.  Denies any blood in stool.  States he does have a history of diverticulitis is uncertain when his last flare was.  Also endorses some chest pain which she says is minimal sternal he does endorse some associated shortness of breath.  Is uncertain whether his symptoms are exertional or not.  Monday Wednesday Friday dialysis patient.  Last dialysis session was Friday.  He states full session.  Review of Systems  Positive: Chest pain, abdominal pain, nausea, diarrhea Negative: Fever  Physical Exam  BP (!) 158/49 (BP Location: Left Arm)   Pulse 72   Temp 98.2 F (36.8 C)   Resp 17   SpO2 99%  Gen:   Awake, no distress   Resp:  Normal effort  MSK:   Moves extremities without difficulty  Other:  Terms of elevation of the anterior sternal chest wall tenderness to palpation of the abdomen with no focal abdominal tenderness.  No guarding or rebound.  Medical Decision Making  Medically screening exam initiated at 12:13 PM.  Appropriate orders placed.  [name] was informed that the remainder of the evaluation will be completed by another provider, this initial triage assessment does not replace that evaluation, and the importance of remaining in the ED until their evaluation is complete.  Elderly 80 year old dialysis patient here with abdominal pain chest pain shortness of breath also diarrhea nausea  Will obtain cardiac work-up as well as abdominal labs and CT abdomen pelvis.   Tedd Sias, [place] 07/03/21 [place], [place], DO 07/03/21 1254

## 2021-07-03 NOTE — ED Provider Notes (Signed)
[facility] EMERGENCY DEPARTMENT Provider Note   CSN: [id number] Arrival date & time: 07/03/21  1125     History Chief Complaint  Patient presents with   Abdominal Pain   Diarrhea   Chest Pain    [name] is a 80 y.o. male.  [name] is a 80 y.o. 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  Patient reports that he initially started having the pain a few weeks ago, reports it was intermittent and located in his right and left lower quadrants.  He reports occasional diarrhea with this but over the weekend started having more persistent pain across his lower abdomen and with it started having several episodes of diarrhea daily.  He reports that since Saturday his stools have been black.  He has not noted any bright red blood.  Prior to this he had not noted dark stools.  No associated nausea, vomiting or hematemesis.  Patient has difficulty providing additional history, he denies fevers.  Wife reports history of GI bleed a few years ago with very similar symptoms.  Reports he has been weaker since symptoms worsened on Saturday.  She denies any known fevers.  Reports no recent antibiotic use.  Not on any blood thinners but does receive heparin during dialysis.  No other aggravating or alleviating factors.  In triage patient had also reported some chest pain but currently he denies this and reports that he has only had some pain and numbness in his left arm which she is experienced previously.  Denies shortness of breath.  The history is provided by the patient, the spouse and medical records.      Past Medical History:  Diagnosis Date   Allergy    Alzheimer's disease ([place]) 01/19/2021   Anemia    Arthritis    Cataract    bil cateracts removed   Coronary artery disease    Dementia  arising in the senium and presenium ([place])    Diabetes mellitus    Type 2   Diverticulitis    ED (erectile dysfunction)    Elevated homocysteine    ESRD (end stage renal disease) on dialysis ([place]) 03/2015   M-W-F dialysis   GERD (gastroesophageal reflux disease)    pepto    Gout    Headache, unspecified 02/25/2015   Hiatal hernia    Hyperlipidemia    Hypertension    Hypothyroidism    Pneumonia    PVD (peripheral vascular disease) ([place])    has plastic aorta   Renal insufficiency    Seasonal allergies    Sleep apnea    does not wear c-pap    Patient Active Problem List   Diagnosis Date Noted   Alzheimer's disease, unspecified (CODE) ([place]) 01/24/2021   Senile nuclear sclerosis 11/22/2020   Other specified erythematous condition 11/22/2020   Other specified disease of sebaceous glands 11/22/2020   Osteoporosis 11/22/2020   Obesity 11/22/2020   Macrocytosis 11/22/2020   Dystrophia unguium 11/22/2020   Tinea pedis 11/22/2020   Pre-operative clearance    Closed nondisplaced intertrochanteric fracture of right femur ([place]) 11/07/2020   Hip fracture ([place]) 11/07/2020   Fall    Rectal bleeding 04/22/2020   AVM (arteriovenous malformation) of small bowel, acquired    Hiatal hernia    Schatzki's ring of distal esophagus    Other bacterial infections of unspecified  site 08/31/2019   Hypocalcemia 04/06/2019   Melena 11/19/2018   Leukocytosis    Anemia    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   Diabetes mellitus type 2 in nonobese (HCC)    Loose stools    Dementia arising in the senium and presenium ([place])    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[place]) 07/21/2018   Multiple fractures of ribs, left side, initial encounter for closed fracture 07/21/2018   Subdural hematoma 07/21/2018   Gastritis, unspecified, with bleeding 06/04/2018   Angiodysplasia of stomach    Occult GI bleeding    GIB (gastrointestinal bleeding) 06/01/2018   Low back pain 01/17/2018   Anorexia     Diverticulitis of intestine, part unspecified, without perforation or abscess without bleeding 08/07/2017   Dyspnea    Macrocytic anemia 06/04/2016   Thrombocytopenia ([place]) 06/04/2016   Exertional dyspnea 06/04/2016   Arm paresthesia, left 06/04/2016   Dyspnea on exertion 06/04/2016   Tenderness of right calf 06/04/2016   Mild cognitive impairment with memory loss 01/12/2016   ESRD on dialysis ([place]) 05/17/2015   Hyperlipidemia, unspecified 02/25/2015   Pruritus, unspecified 02/25/2015   Pain, unspecified 02/25/2015   Other specified coagulation defects ([place]) 02/25/2015   Iron deficiency anemia, unspecified 02/25/2015   Headache, unspecified 02/25/2015   Gout, unspecified 02/25/2015   Fever, unspecified 02/25/2015   Diarrhea, unspecified 02/25/2015   Anemia in chronic kidney disease 02/25/2015   Acute embolism and thrombosis of left subclavian vein ([place]) 02/25/2015   Hypoglycemia    Weakness 02/18/2015   OSA (obstructive sleep apnea) 02/02/2015   Hyperkalemia 10/05/2014   End stage renal disease ([place]) 11/18/2013   BPH (benign prostatic hyperplasia) 11/22/2012   Peripheral vascular disease ([place]) 12/24/2011   Hyperparathyroidism ([place]) 12/24/2011   Hypothyroidism 12/24/2011   Allergic rhinitis 12/24/2011   Erectile dysfunction 12/24/2011   Insulin dependent diabetes mellitus 09/03/2008   Essential hypertension 08/30/2008   PANCREATITIS, HX OF 08/30/2008   RENAL FAILURE, ACUTE, HX OF 08/30/2008   DIVERTICULOSIS, COLON 06/08/2003    Past Surgical History:  Procedure Laterality Date   ANGIOPLASTY Right 01/26/2021   Procedure: ANGIOPLASTY RIGHT SUBCLAVIAN VEIN;  Surgeon: Serafina Mitchell, MD;  Location: [place];  Service: Vascular;  Laterality: Right;   aortobifemoral bypass     AV FISTULA PLACEMENT Left 12/01/2013   Procedure: ARTERIOVENOUS (AV) FISTULA CREATION- LEFT BRACHIOCEPHALIC;  Surgeon: Angelia Mould, MD;  Location: [place];  Service: Vascular;  Laterality: Left;    BASCILIC VEIN TRANSPOSITION Right 07/27/2014   Procedure: BASCILIC VEIN TRANSPOSITION;  Surgeon: Angelia Mould, MD;  Location: [name];  Service: Vascular;  Laterality: Right;   BRAIN SURGERY  07/22/2018   BREAST SURGERY     left - granulomatous mastitis   BURR HOLE Bilateral 07/22/2018   Procedure: BILATERAL BURR HOLES;  Surgeon: Kristeen Miss, MD;  Location: [place];  Service: Neurosurgery;  Laterality: Bilateral;   COLONOSCOPY     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 ([place] HX)  1992   ENTEROSCOPY N/A 06/03/2018   Procedure: ENTEROSCOPY;  Surgeon: Lavena Bullion, DO;  Location: [facility];  Service: Gastroenterology;  Laterality: N/A;   ENTEROSCOPY N/A 11/20/2018   Procedure: ENTEROSCOPY;  Surgeon: Rush Landmark Telford Nab., MD;  Location: [place];  Service: Gastroenterology;  Laterality: N/A;   ENTEROSCOPY N/A 12/03/2019   Procedure: ENTEROSCOPY;  Surgeon: Lavena Bullion, DO;  Location: [facility];  Service: Gastroenterology;  Laterality: N/A;  push enteroscopy   EYE SURGERY Bilateral  cataracts   FISTULOGRAM Right 01/26/2021   Procedure: FISTULOGRAM RIGHT;  Surgeon: Serafina Mitchell, MD;  Location: [facility] OR;  Service: Vascular;  Laterality: Right;   HEMODIALYSIS INPATIENT  01/17/2018       HOT HEMOSTASIS N/A 06/03/2018   Procedure: HOT HEMOSTASIS (ARGON PLASMA COAGULATION/BICAP);  Surgeon: Lavena Bullion, DO;  Location: [facility] ENDOSCOPY;  Service: Gastroenterology;  Laterality: N/A;   HOT HEMOSTASIS N/A 11/20/2018   Procedure: HOT HEMOSTASIS (ARGON PLASMA COAGULATION/BICAP);  Surgeon: Irving Copas., MD;  Location: [place];  Service: Gastroenterology;  Laterality: N/A;   HOT HEMOSTASIS N/A 12/03/2019   Procedure: HOT HEMOSTASIS (ARGON PLASMA COAGULATION/BICAP);  Surgeon: Lavena Bullion, DO;  Location: [facility];  Service: Gastroenterology;  Laterality: N/A;   INTRAMEDULLARY (IM) NAIL INTERTROCHANTERIC Right 11/08/2020   Procedure: INTRAMEDULLARY (IM) NAIL  INTERTROCHANTRIC;  Surgeon: Erle Crocker, MD;  Location: [place];  Service: Orthopedics;  Laterality: Right;   REVISION OF ARTERIOVENOUS GORETEX GRAFT Right 09/01/2019   Procedure: REVISION OF ARTERIOVENOUS FISTULA RIGHT ARM;  Surgeon: Serafina Mitchell, MD;  Location: [place];  Service: Vascular;  Laterality: Right;   REVISON OF ARTERIOVENOUS FISTULA Left 02/09/2014   Procedure: REVISON OF LEFT ARTERIOVENOUS FISTULA - RESECTION OF RENDUNDANT VEIN;  Surgeon: Angelia Mould, MD;  Location: [place];  Service: Vascular;  Laterality: Left;   REVISON OF ARTERIOVENOUS FISTULA Right 01/26/2021   Procedure: REVISON OF ARTERIOVENOUS FISTULA RIGHT;  Surgeon: Serafina Mitchell, MD;  Location: [place];  Service: Vascular;  Laterality: Right;   SBO with lysis adhesions     SHUNTOGRAM Left 04/19/2014   Procedure: FISTULOGRAM;  Surgeon: Angelia Mould, MD;  Location: [facility] CATH LAB;  Service: Cardiovascular;  Laterality: Left;   UNILATERAL UPPER EXTREMEITY ANGIOGRAM N/A 07/12/2014   Procedure: UNILATERAL UPPER Anselmo Rod;  Surgeon: Angelia Mould, MD;  Location: [facility] CATH LAB;  Service: Cardiovascular;  Laterality: N/A;       Family History  Problem Relation Age of Onset   Aneurysm Mother        brain   Heart disease Father    Stroke Father    Hypertension Father    Diabetes Father    Dementia Neg Hx    Colon cancer Neg Hx    Esophageal cancer Neg Hx    Pancreatic cancer Neg Hx    Prostate cancer Neg Hx    Rectal cancer Neg Hx    Stomach cancer Neg Hx     Social History   Tobacco Use   Smoking status: Former    Types: Cigarettes    Quit date: 09/24/1994    Years since quitting: 26.7   Smokeless tobacco: Never  Vaping Use   Vaping Use: Never used  Substance Use Topics   Alcohol use: No    Alcohol/week: 0.0 standard drinks    Comment: Quit Oct. 1977 ("somewhat heavy")   Drug use: No    Home Medications Prior to Admission medications   Medication Sig Start Date End  Date Taking? Authorizing Provider  acetaminophen (TYLENOL) 325 MG tablet Take 1-2 tablets (325-650 mg total) by mouth every 4 (four) hours as needed for mild pain. 08/01/18   Love, Ivan Anchors, PA-C  albuterol (VENTOLIN HFA) 108 (90 Base) MCG/ACT inhaler Inhale 1 puff into the lungs daily as needed for wheezing or shortness of breath.    [provider]  allopurinol (ZYLOPRIM) 100 MG tablet Take 100 mg by mouth daily.    [provider]  Blood  Glucose Monitoring Suppl (FREESTYLE FREEDOM LITE) w/Device KIT Use to check blood sugar 2 times per day dx code E11.65 10/21/18   Elayne Snare, MD  camphor-menthol [facility]) lotion Apply 1 application topically 4 (four) times daily as needed for itching.     [provider]  Carboxymethylcellulose Sodium (THERATEARS) 0.25 % SOLN Place 1 drop into both eyes as needed (for dryness).    [provider]  cetirizine (ZYRTEC) 10 MG chewable tablet Chew 10 mg by mouth every Monday, Wednesday, and Friday.    [provider]  clobetasol (TEMOVATE) 0.05 % external solution Apply 1 application topically 2 (two) times daily as needed (scalp irritation.).  04/19/15   [provider]  cyanocobalamin (,VITAMIN B-12,) 1000 MCG/ML injection Inject 1 mL (1,000 mcg total) into the muscle every 30 (thirty) days. 05/18/21   Elby Showers, MD  donepezil (ARICEPT) 10 MG tablet Take 1 tablet (10 mg total) by mouth at bedtime. 07/28/20   Melvenia Beam, MD  doxycycline (VIBRAMYCIN) 100 MG capsule Take 1 capsule (100 mg total) by mouth 2 (two) times daily. 04/11/21   Petrucelli, Samantha R, PA-C  FLUOCINOLONE ACETONIDE SCALP 0.01 % OIL Apply 1 application topically daily as needed (scalp irritation.).  04/28/15   [provider]  fluticasone (FLONASE) 50 MCG/ACT nasal spray Place 1 spray into both nostrils daily. 10/08/14   Mikhail, Velta Addison, DO  glucose blood (FREESTYLE LITE) test strip USE AS DIRECTED THREE TIMES DAILY 01/16/21   Elayne Snare, MD  HYDROcodone-acetaminophen (NORCO/VICODIN) 5-325 MG tablet Take 1 tablet by mouth every 4 (four) hours as needed for moderate pain. 01/26/21 01/26/22  Setzer, Edman Circle, PA-C  hydrocortisone 2.5 % cream Apply 1 application topically 3 (three) times daily as needed (skin irritation (legs & arms)).  01/13/19   [provider]  insulin aspart (NOVOLOG FLEXPEN) 100 UNIT/ML FlexPen INJECT 6-8 UNITS UNDER THE SKIN THREE TIMES DAILY BEFORE MEALS. DX:E11.65 11/25/18   Elayne Snare, MD  Insulin Pen Needle (PEN NEEDLES) 32G X 4 MM MISC 1 each by Does not apply route 3 (three) times daily. USE PEN NEEDLES TO INJECT INSULIN THREE TIMES DAILY. 11/25/18   Elayne Snare, MD  ketoconazole (NIZORAL) 2 % shampoo Apply 1 application topically 2 (two) times a week. 03/31/15   Elby Showers, MD  Lancets (FREESTYLE) lancets Use as instructed to check blood sugar 2 times per day dx code E11.65 02/22/16   Elayne Snare, MD  levothyroxine (SYNTHROID, LEVOTHROID) 50 MCG tablet Take 1 tablet (50 mcg total) by mouth daily. Patient taking differently: Take 50 mcg by mouth daily before breakfast. 08/06/14   Baxley, Cresenciano Lick, MD  lidocaine (LIDODERM) 5 % Place 1 patch onto the skin daily as needed (back pain).  11/12/19   [provider]  memantine (NAMENDA) 10 MG tablet Take 1 tablet (10 mg total) by mouth 2 (two) times daily. 07/28/20   Melvenia Beam, MD  midodrine (PROAMATINE) 10 MG tablet Take one pill prior to hemodialysis on MWF Patient taking differently: Take 5 mg by mouth every Monday, Wednesday, and Friday. Take one pill prior to hemodialysis on MWF 08/13/18   Love, Ivan Anchors, PA-C  Nutritional Supplements (FEEDING SUPPLEMENT, NEPRO CARB STEADY,) LIQD Take 237 mLs by mouth in the morning and at bedtime.     [provider]  omeprazole (PRILOSEC) 40 MG capsule Take 1 capsule (40 mg total) by mouth daily. 03/13/18   Esterwood, Amy S, PA-C  sevelamer carbonate (RENVELA) 800  MG tablet Take 1,600-2,400 mg by  mouth See admin instructions. Take 2,400 mg by mouth three times a day with meals and 1,600 mg with snacks on Sun/Tues/Thurs/Sat and 2,400 mg with breakfast and dinner and nothing with snacks on Mon/Wed/Fri    [provider]  simvastatin (ZOCOR) 20 MG tablet Take 20 mg by mouth at bedtime.    [provider]  Teriparatide, Recombinant, (FORTEO) 600 MCG/2.4ML SOPN Inject 20 mcg subcutaneously daily 03/25/21   Elayne Snare, MD  TRADJENTA 5 MG TABS tablet TAKE 1 TABLET DAILY Patient taking differently: Take 5 mg by mouth daily. 03/01/20   Elayne Snare, MD    Allergies    Ambien [zolpidem tartrate] and Penicillins  Review of Systems   Review of Systems  Constitutional:  Positive for fatigue. Negative for chills and fever.  HENT: Negative.    Respiratory:  Negative for cough and shortness of breath.   Cardiovascular:  Negative for chest pain, palpitations and leg swelling.  Gastrointestinal:  Positive for abdominal pain and diarrhea. Negative for blood in stool, nausea and vomiting.  Genitourinary:  Negative for dysuria and frequency.  Musculoskeletal:  Negative for arthralgias and myalgias.  Skin:  Negative for color change and rash.  Neurological:  Negative for dizziness, syncope and light-headedness.  All other systems reviewed and are negative.  Physical Exam Updated Vital Signs BP (!) 173/68 (BP Location: Left Arm)   Pulse 77   Temp 98.2 F (36.8 C)   Resp 18   SpO2 100%   Physical Exam Vitals and nursing note reviewed.  Constitutional:      General: He is not in acute distress.    Appearance: Normal appearance. He is well-developed. He is not diaphoretic.     Comments: Alert, chronically ill-appearing but in no acute distress, pleasantly demented  HENT:     Head: Normocephalic and atraumatic.     Mouth/Throat:     Mouth: Mucous membranes are moist.     Pharynx: Oropharynx is clear.  Eyes:     General:        Right eye: No discharge.        Left eye: No  discharge.     Pupils: Pupils are equal, round, and reactive to light.  Cardiovascular:     Rate and Rhythm: Normal rate and regular rhythm.     Pulses: Normal pulses.     Heart sounds: Normal heart sounds.  Pulmonary:     Effort: Pulmonary effort is normal. No respiratory distress.     Breath sounds: Normal breath sounds. No wheezing or rales.     Comments: Respirations equal and unlabored, patient able to speak in full sentences, lungs clear to auscultation bilaterally  Abdominal:     General: Bowel sounds are normal. There is no distension.     Palpations: Abdomen is soft. There is no mass.     Tenderness: There is abdominal tenderness. There is no guarding.     Comments: Abdomen soft, nondistended, mild tenderness across the lower abdomen without guarding or peritoneal signs, no upper abdominal tenderness.  Genitourinary:    Comments: Chaperone present during rectal exam. Normal rectal tone, dark brown stool noted, no bright red blood. Musculoskeletal:        General: No deformity.     Cervical back: Neck supple.  Skin:    General: Skin is warm and dry.     Capillary Refill: Capillary refill takes less than 2 seconds.  Neurological:     Mental Status:  He is alert and oriented to person, place, and time.     Coordination: Coordination normal.     Comments: Speech is clear, able to follow commands Moves extremities without ataxia, coordination intact  Psychiatric:        Mood and Affect: Mood normal.        Behavior: Behavior normal.    ED Results / Procedures / Treatments   Labs (all labs ordered are listed, but only abnormal results are displayed) Labs Reviewed  COMPREHENSIVE METABOLIC PANEL - Abnormal; Notable for the following components:      Result Value   Glucose, Bld 110 (*)    BUN 40 (*)    Creatinine, Ser 8.66 (*)    Total Protein 5.8 (*)    Albumin 2.9 (*)    Alkaline Phosphatase 218 (*)    GFR, Estimated 6 (*)    All other components within normal limits   CBC WITH DIFFERENTIAL/PLATELET - Abnormal; Notable for the following components:   RBC 2.70 (*)    Hemoglobin 9.1 (*)    HCT 29.6 (*)    MCV 109.6 (*)    RDW 18.2 (*)    All other components within normal limits  TROPONIN I (HIGH SENSITIVITY) - Abnormal; Notable for the following components:   Troponin I (High Sensitivity) 36 (*)    All other components within normal limits  RESP PANEL BY RT-PCR (FLU A&B, COVID) ARPGX2  LIPASE, BLOOD  TROPONIN I (HIGH SENSITIVITY)    EKG None  Radiology CT ABDOMEN PELVIS WO CONTRAST  Result Date: 07/03/2021 CLINICAL DATA:  Abdominal pain, left lower quadrant pain. EXAM: CT ABDOMEN AND PELVIS WITHOUT CONTRAST TECHNIQUE: Multidetector CT imaging of the abdomen and pelvis was performed following the standard protocol without IV contrast. COMPARISON:  07/17/2020 FINDINGS: Lower chest: New small bilateral pleural effusions. No confluent opacities. Hepatobiliary: No focal hepatic abnormality. Gallbladder unremarkable. Pancreas: No focal abnormality or ductal dilatation. Spleen: No focal abnormality.  Normal size. Adrenals/Urinary Tract: Kidneys are atrophic with cortical thinning and numerous bilateral renal cysts. No hydronephrosis. No renal or ureteral stones. Adrenal glands and urinary bladder unremarkable. Stomach/Bowel: Sigmoid diverticulosis. No active diverticulitis. Stomach and small bowel decompressed, unremarkable. Vascular/Lymphatic: Heavily calcified aorta. Status post aortobifemoral bypass. No evidence of aneurysm or adenopathy. Reproductive: Central calcifications within the prostate. Other: No free fluid or free air. Musculoskeletal: No acute bony abnormality. IMPRESSION: Small bilateral pleural effusions. Few scattered sigmoid diverticula.  No active diverticulitis. Renal cortical thinning and atrophy with numerous renal cysts. No hydronephrosis. Prior aorto bi femoral bypass. No acute findings. Electronically Signed   By: Rolm Baptise M.D.   On:  07/03/2021 18:15   DG Chest 2 View  Result Date: 07/03/2021 CLINICAL DATA:  Chest pain. EXAM: CHEST - 2 VIEW COMPARISON:  January 12, 2021. FINDINGS: Mild enlargement the cardiac silhouette. Right IJ dual lumen dialysis catheter with the tip projecting at the right atrium. Calcific atherosclerosis of the aorta. No consolidation. Small bilateral pleural effusions. Mild diffuse interstitial prominence. IMPRESSION: Small bilateral effusions. Mild diffuse interstitial prominence, which may represent mild interstitial edema versus the sequela of recurrent bouts of congestive heart failure. Electronically Signed   By: Margaretha Sheffield M.D.   On: 07/03/2021 13:07    Procedures Procedures   Medications Ordered in ED Medications  iohexol (OMNIPAQUE) 9 MG/ML oral solution (  Contrast Given 07/03/21 1236)    ED Course  I have reviewed the triage vital signs and the nursing notes.  Pertinent labs & imaging  results that were available during my care of the patient were reviewed by me and considered in my medical decision making (see chart for details).    MDM Rules/Calculators/A&P                           80 year old male presents with lower abdominal pain associated with increasing diarrhea over the past 3 days and patient reports persistent melena since Saturday.  No bright red blood in stools.  Prior history of GI bleeding few years ago.  Not on chronic anticoagulation but does receive heparin with dialysis.  Missed dialysis today due to the symptoms.  Was last dialyzed on Friday.  On exam patient appears chronically ill but in no acute distress.  He is patient initiated with abdominal labs as well as troponin given initial reported chest pain although patient currently denies this.  Chest x-ray and CT of the pelvis ordered as well.  I have independently ordered, reviewed and interpreted all labs and imaging: CBC: No leukocytosis, hemoglobin of 9.1, on 1 g drop from patient's typical baseline  around 10 CMP: Despite missing dialysis today potassium of 4.4, creatinine of 8.66 and BUN of 40, alk phos is mildly elevated but otherwise LFTs are unremarkable.  No other significant electrolyte derangements Lipase: WNL Troponin: Initially mildly elevated at 36, delta without significant increase at 37, likely in the setting of ESRD Hemoccult: Positive  CXR: Small bilateral pleural effusions, mild diffuse interstitial prominence, likely in the setting of missed dialysis today but patient is satting at 100% on room air with no increased work of breathing  CT abdomen pelvis: Small bilateral pleural effusions again noted, few scattered sigmoid diverticula but no evidence of diverticulitis, numerous renal cyst noted, no acute abnormalities.  Patient with heme positive stools, history of prior GI bleeding and reported persistent and worsening melena over the past 3 days.  Wife is very concerned giving similar presentation to last GI bleed where patient required hospitalization and is concerned about going home and symptoms worsening.  Delta hemoglobin today does not show significant decrease over 7 hours which is reassuring but will consult GI and plan to admit patient to medicine.  Patient started on IV Protonix  Dr. Rush Landmark with [facility] consulted and will see and evaluate patient tomorrow morning.  Case discussed with Dr. Cyd Silence with [facility] hospitalist who will see and admit the patient.  Final Clinical Impression(s) / ED Diagnoses Final diagnoses:  Gastrointestinal hemorrhage, unspecified gastrointestinal hemorrhage type  Diarrhea, unspecified type  Lower abdominal pain    Rx / DC Orders ED Discharge Orders     None        Jacqlyn Larsen, PA-C 07/04/21 0000    Drenda Freeze, MD 07/06/21 [phone]

## 2021-07-03 NOTE — ED Triage Notes (Signed)
Pt. Stated, ive had stomach pain with diarrhea all the time. Its been going on for 2-3 weeks but really got bad yesterday.

## 2021-07-03 NOTE — ED Triage Notes (Signed)
Pt arrived by [facility] from home. Reports left side abd pain since yesterday with dark stools. Pt is dialysis patient, due for treatment today.

## 2021-07-04 ENCOUNTER — Encounter (HOSPITAL_COMMUNITY): Payer: Self-pay | Admitting: Internal Medicine

## 2021-07-04 ENCOUNTER — Observation Stay (HOSPITAL_COMMUNITY): Payer: Medicare Other | Admitting: Certified Registered"

## 2021-07-04 ENCOUNTER — Encounter (HOSPITAL_COMMUNITY)
Admission: EM | Disposition: A | Discharge status: Home / Self Care | Payer: Self-pay | Source: Home / Self Care | Attending: Student in an Organized Health Care Education/Training Program

## 2021-07-04 DIAGNOSIS — Z66 Do not resuscitate: Secondary | ICD-10-CM | POA: Diagnosis not present

## 2021-07-04 DIAGNOSIS — E1169 Type 2 diabetes mellitus with other specified complication: Secondary | ICD-10-CM | POA: Diagnosis not present

## 2021-07-04 DIAGNOSIS — E782 Mixed hyperlipidemia: Secondary | ICD-10-CM | POA: Diagnosis not present

## 2021-07-04 DIAGNOSIS — E038 Other specified hypothyroidism: Secondary | ICD-10-CM

## 2021-07-04 DIAGNOSIS — E039 Hypothyroidism, unspecified: Secondary | ICD-10-CM | POA: Diagnosis not present

## 2021-07-04 DIAGNOSIS — I10 Essential (primary) hypertension: Secondary | ICD-10-CM | POA: Diagnosis not present

## 2021-07-04 DIAGNOSIS — N186 End stage renal disease: Secondary | ICD-10-CM | POA: Diagnosis not present

## 2021-07-04 DIAGNOSIS — Z992 Dependence on renal dialysis: Secondary | ICD-10-CM

## 2021-07-04 DIAGNOSIS — Z20822 Contact with and (suspected) exposure to covid-19: Secondary | ICD-10-CM | POA: Diagnosis not present

## 2021-07-04 DIAGNOSIS — D696 Thrombocytopenia, unspecified: Secondary | ICD-10-CM | POA: Diagnosis not present

## 2021-07-04 DIAGNOSIS — K921 Melena: Secondary | ICD-10-CM | POA: Diagnosis not present

## 2021-07-04 DIAGNOSIS — E1122 Type 2 diabetes mellitus with diabetic chronic kidney disease: Secondary | ICD-10-CM | POA: Diagnosis not present

## 2021-07-04 DIAGNOSIS — E8889 Other specified metabolic disorders: Secondary | ICD-10-CM | POA: Diagnosis not present

## 2021-07-04 DIAGNOSIS — K5520 Angiodysplasia of colon without hemorrhage: Secondary | ICD-10-CM

## 2021-07-04 DIAGNOSIS — K5521 Angiodysplasia of colon with hemorrhage: Secondary | ICD-10-CM | POA: Diagnosis not present

## 2021-07-04 DIAGNOSIS — R778 Other specified abnormalities of plasma proteins: Secondary | ICD-10-CM | POA: Diagnosis not present

## 2021-07-04 DIAGNOSIS — I120 Hypertensive chronic kidney disease with stage 5 chronic kidney disease or end stage renal disease: Secondary | ICD-10-CM | POA: Diagnosis not present

## 2021-07-04 DIAGNOSIS — K31811 Angiodysplasia of stomach and duodenum with bleeding: Secondary | ICD-10-CM | POA: Diagnosis not present

## 2021-07-04 DIAGNOSIS — F0390 Unspecified dementia without behavioral disturbance: Secondary | ICD-10-CM

## 2021-07-04 DIAGNOSIS — K31819 Angiodysplasia of stomach and duodenum without bleeding: Secondary | ICD-10-CM

## 2021-07-04 DIAGNOSIS — D631 Anemia in chronic kidney disease: Secondary | ICD-10-CM | POA: Diagnosis not present

## 2021-07-04 DIAGNOSIS — E875 Hyperkalemia: Secondary | ICD-10-CM | POA: Diagnosis not present

## 2021-07-04 HISTORY — PX: ENTEROSCOPY: SHX5533

## 2021-07-04 LAB — GLUCOSE, CAPILLARY
Glucose-Capillary: 121 mg/dL — ABNORMAL HIGH (ref 70–99)
Glucose-Capillary: 105 mg/dL — ABNORMAL HIGH (ref 70–99)

## 2021-07-04 LAB — MAGNESIUM: Magnesium: 2 mg/dL (ref 1.7–2.4)

## 2021-07-04 LAB — COMPREHENSIVE METABOLIC PANEL
ALT: 8 U/L (ref 0–44)
AST: 27 U/L (ref 15–41)
Albumin: 2.5 g/dL — ABNORMAL LOW (ref 3.5–5.0)
Alkaline Phosphatase: 210 U/L — ABNORMAL HIGH (ref 38–126)
Anion gap: 16 — ABNORMAL HIGH (ref 5–15)
BUN: 46 mg/dL — ABNORMAL HIGH (ref 8–23)
CO2: 20 mmol/L — ABNORMAL LOW (ref 22–32)
Calcium: 8.7 mg/dL — ABNORMAL LOW (ref 8.9–10.3)
Chloride: 100 mmol/L (ref 98–111)
Creatinine, Ser: 9.24 mg/dL — ABNORMAL HIGH (ref 0.61–1.24)
GFR, Estimated: 5 mL/min — ABNORMAL LOW (ref >60)
Glucose, Bld: 70 mg/dL (ref 70–99)
Potassium: 4.7 mmol/L (ref 3.5–5.1)
Sodium: 136 mmol/L (ref 135–145)
Total Bilirubin: 0.8 mg/dL (ref 0.3–1.2)
Total Protein: 5.4 g/dL — ABNORMAL LOW (ref 6.5–8.1)

## 2021-07-04 LAB — CBC
HCT: 27.3 % — ABNORMAL LOW (ref 39.0–52.0)
Hemoglobin: 8.2 g/dL — ABNORMAL LOW (ref 13.0–17.0)
MCH: 34.6 pg — ABNORMAL HIGH (ref 26.0–34.0)
MCHC: 30 g/dL (ref 30.0–36.0)
MCV: 115.2 fL — ABNORMAL HIGH (ref 80.0–100.0)
Platelets: 182 10*3/uL (ref 150–400)
RBC: 2.37 MIL/uL — ABNORMAL LOW (ref 4.22–5.81)
RDW: 18.3 % — ABNORMAL HIGH (ref 11.5–15.5)
WBC: 6.1 10*3/uL (ref 4.0–10.5)
nRBC: 0.5 % — ABNORMAL HIGH (ref 0.0–0.2)

## 2021-07-04 LAB — HEMOGLOBIN A1C
Hgb A1c MFr Bld: 4.6 % — ABNORMAL LOW (ref 4.8–5.6)
Mean Plasma Glucose: 85.32 mg/dL

## 2021-07-04 LAB — CBG MONITORING, ED: Glucose-Capillary: 72 mg/dL (ref 70–99)

## 2021-07-04 SURGERY — ENTEROSCOPY
Anesthesia: Monitor Anesthesia Care

## 2021-07-04 MED ORDER — DARBEPOETIN ALFA 150 MCG/0.3ML IJ SOSY
150.0000 ug | PREFILLED_SYRINGE | INTRAMUSCULAR | Status: DC
  Administered 2021-07-05: 150 ug via INTRAVENOUS
  Filled 2021-07-04 (×2): qty 0.3

## 2021-07-04 MED ORDER — SODIUM CHLORIDE 0.9 % IV SOLN: 100.0000 mL | INTRAVENOUS | Status: DC | PRN

## 2021-07-04 MED ORDER — DONEPEZIL HCL 10 MG PO TABS
10.0000 mg | ORAL_TABLET | Freq: Every day | ORAL | Status: DC
  Administered 2021-07-04 – 2021-07-05 (×2): 10 mg via ORAL
  Filled 2021-07-04 (×3): qty 1

## 2021-07-04 MED ORDER — PROPOFOL 10 MG/ML IV BOLUS
INTRAVENOUS | Status: DC | PRN
  Administered 2021-07-04: 10 mg via INTRAVENOUS

## 2021-07-04 MED ORDER — HYDRALAZINE HCL 20 MG/ML IJ SOLN: 10.0000 mg | Freq: Four times a day (QID) | INTRAMUSCULAR | Status: DC | PRN

## 2021-07-04 MED ORDER — GLUCAGON HCL RDNA (DIAGNOSTIC) 1 MG IJ SOLR
INTRAMUSCULAR | Status: AC
  Filled 2021-07-04: qty 1

## 2021-07-04 MED ORDER — NEPRO/CARBSTEADY PO LIQD
237.0000 mL | Freq: Three times a day (TID) | ORAL | Status: DC
  Administered 2021-07-04 – 2021-07-06 (×4): 237 mL via ORAL
  Filled 2021-07-04: qty 237

## 2021-07-04 MED ORDER — SODIUM CHLORIDE 0.9 % IV SOLN
125.0000 mg | INTRAVENOUS | Status: DC
  Filled 2021-07-04: qty 10

## 2021-07-04 MED ORDER — LIDOCAINE-PRILOCAINE 2.5-2.5 % EX CREA
1.0000 "application " | TOPICAL_CREAM | CUTANEOUS | Status: DC | PRN
  Filled 2021-07-04: qty 5

## 2021-07-04 MED ORDER — LIDOCAINE HCL (PF) 1 % IJ SOLN: 5.0000 mL | INTRAMUSCULAR | Status: DC | PRN

## 2021-07-04 MED ORDER — ACETAMINOPHEN 650 MG RE SUPP: 650.0000 mg | Freq: Four times a day (QID) | RECTAL | Status: DC | PRN

## 2021-07-04 MED ORDER — SEVELAMER CARBONATE 800 MG PO TABS
2400.0000 mg | ORAL_TABLET | Freq: Three times a day (TID) | ORAL | Status: DC
  Administered 2021-07-04 – 2021-07-06 (×3): 2400 mg via ORAL
  Filled 2021-07-04 (×3): qty 3

## 2021-07-04 MED ORDER — SIMVASTATIN 20 MG PO TABS
20.0000 mg | ORAL_TABLET | Freq: Every day | ORAL | Status: DC
  Administered 2021-07-04 – 2021-07-05 (×2): 20 mg via ORAL
  Filled 2021-07-04 (×2): qty 1

## 2021-07-04 MED ORDER — SODIUM CHLORIDE 0.9 % IV SOLN: INTRAVENOUS | Status: DC

## 2021-07-04 MED ORDER — PENTAFLUOROPROP-TETRAFLUOROETH EX AERO: 1.0000 "application " | INHALATION_SPRAY | CUTANEOUS | Status: DC | PRN

## 2021-07-04 MED ORDER — PANTOPRAZOLE SODIUM 40 MG IV SOLR
40.0000 mg | Freq: Two times a day (BID) | INTRAVENOUS | Status: DC
  Administered 2021-07-04 – 2021-07-05 (×3): 40 mg via INTRAVENOUS
  Filled 2021-07-04 (×4): qty 40

## 2021-07-04 MED ORDER — LEVOTHYROXINE SODIUM 50 MCG PO TABS
50.0000 ug | ORAL_TABLET | Freq: Every day | ORAL | Status: DC
  Administered 2021-07-04 – 2021-07-06 (×3): 50 ug via ORAL
  Filled 2021-07-04 (×4): qty 1

## 2021-07-04 MED ORDER — FLUTICASONE PROPIONATE 50 MCG/ACT NA SUSP
1.0000 | Freq: Every day | NASAL | Status: DC
  Administered 2021-07-04 – 2021-07-06 (×2): 1 via NASAL
  Filled 2021-07-04: qty 16

## 2021-07-04 MED ORDER — OCTREOTIDE ACETATE 50 MCG/ML IJ SOLN
50.0000 ug | Freq: Two times a day (BID) | INTRAMUSCULAR | Status: DC
  Administered 2021-07-05 – 2021-07-06 (×3): 50 ug via SUBCUTANEOUS
  Filled 2021-07-04 (×4): qty 1

## 2021-07-04 MED ORDER — SODIUM CHLORIDE 0.9 % IV SOLN: INTRAVENOUS | Status: DC | PRN

## 2021-07-04 MED ORDER — HEPARIN SODIUM (PORCINE) 1000 UNIT/ML DIALYSIS
1000.0000 [IU] | INTRAMUSCULAR | Status: DC | PRN
Start: 2021-07-05 — End: 2021-07-05
  Filled 2021-07-04: qty 1

## 2021-07-04 MED ORDER — ALBUTEROL SULFATE (2.5 MG/3ML) 0.083% IN NEBU: 3.0000 mL | INHALATION_SOLUTION | Freq: Four times a day (QID) | RESPIRATORY_TRACT | Status: DC | PRN

## 2021-07-04 MED ORDER — GLUCAGON HCL RDNA (DIAGNOSTIC) 1 MG IJ SOLR
INTRAMUSCULAR | Status: DC | PRN
  Administered 2021-07-04: .2 mg via INTRAVENOUS

## 2021-07-04 MED ORDER — MIDODRINE HCL 5 MG PO TABS: 5.0000 mg | ORAL_TABLET | ORAL | Status: DC

## 2021-07-04 MED ORDER — MEMANTINE HCL 5 MG PO TABS
10.0000 mg | ORAL_TABLET | Freq: Two times a day (BID) | ORAL | Status: DC
  Administered 2021-07-04 – 2021-07-06 (×4): 10 mg via ORAL
  Filled 2021-07-04: qty 1
  Filled 2021-07-04: qty 2
  Filled 2021-07-04: qty 1
  Filled 2021-07-04 (×2): qty 2

## 2021-07-04 MED ORDER — ALTEPLASE 2 MG IJ SOLR: 2.0000 mg | Freq: Once | INTRAMUSCULAR | Status: DC | PRN

## 2021-07-04 MED ORDER — ACETAMINOPHEN 325 MG PO TABS
650.0000 mg | ORAL_TABLET | Freq: Four times a day (QID) | ORAL | Status: DC | PRN
  Administered 2021-07-05 – 2021-07-06 (×2): 650 mg via ORAL
  Filled 2021-07-04 (×2): qty 2

## 2021-07-04 MED ORDER — SEVELAMER CARBONATE 800 MG PO TABS: 1600.0000 mg | ORAL_TABLET | Freq: Two times a day (BID) | ORAL | Status: DC | PRN

## 2021-07-04 MED ORDER — ONDANSETRON HCL 4 MG/2ML IJ SOLN: 4.0000 mg | Freq: Four times a day (QID) | INTRAMUSCULAR | Status: DC | PRN

## 2021-07-04 MED ORDER — SEVELAMER CARBONATE 800 MG PO TABS: 1600.0000 mg | ORAL_TABLET | ORAL | Status: DC

## 2021-07-04 MED ORDER — INSULIN ASPART 100 UNIT/ML IJ SOLN: 0.0000 [IU] | Freq: Three times a day (TID) | INTRAMUSCULAR | Status: DC

## 2021-07-04 MED ORDER — PROPOFOL 500 MG/50ML IV EMUL
INTRAVENOUS | Status: DC | PRN
  Administered 2021-07-04: 100 ug/kg/min via INTRAVENOUS

## 2021-07-04 MED ORDER — ONDANSETRON HCL 4 MG PO TABS: 4.0000 mg | ORAL_TABLET | Freq: Four times a day (QID) | ORAL | Status: DC | PRN

## 2021-07-04 MED ORDER — ALLOPURINOL 100 MG PO TABS
100.0000 mg | ORAL_TABLET | Freq: Every day | ORAL | Status: DC
  Administered 2021-07-04 – 2021-07-06 (×2): 100 mg via ORAL
  Filled 2021-07-04 (×2): qty 1

## 2021-07-04 NOTE — Assessment & Plan Note (Signed)
   As needed intravenous hydralazine for markedly elevated blood pressure.

## 2021-07-04 NOTE — Assessment & Plan Note (Addendum)
   Patient presenting with several days of abdominal pain and melena  Notable drop in hemoglobin compared to previous CBC 1 month ago.  Melena typically does suggest that a source of bleeding could be upper in location, this is not entirely clear at this time.  Patient's been placed on Protonix IV twice daily  Performing serial CBCs to monitor hemoglobin hematocrit  Monitoring clinically for further bouts of bleeding  ER provider has already reached out to gastroenterology via epic secure chat who graciously has agreed to be evaluated the patient and consultation in the morning for possible endoscopic evaluation.  We will transfuse with packed red blood cells if hemoglobin drops below 7.

## 2021-07-04 NOTE — Anesthesia Preprocedure Evaluation (Signed)
Anesthesia Evaluation  Patient identified by MRN, date of birth, ID band Patient awake    Reviewed: Allergy & Precautions, H&P , NPO status , Patient's Chart, lab work & pertinent test results  Airway Mallampati: III   Neck ROM: full    Dental   Pulmonary shortness of breath, sleep apnea , former smoker,    breath sounds clear to auscultation       Cardiovascular hypertension, + CAD   Rhythm:regular Rate:Normal     Neuro/Psych  Headaches, PSYCHIATRIC DISORDERS Dementia    GI/Hepatic hiatal hernia, GERD  ,  Endo/Other  diabetes, Type 2Hypothyroidism   Renal/GU Renal InsufficiencyRenal disease     Musculoskeletal  (+) Arthritis ,   Abdominal   Peds  Hematology   Anesthesia Other Findings   Reproductive/Obstetrics                             Anesthesia Physical Anesthesia Plan  ASA: 3  Anesthesia Plan: MAC   Post-op Pain Management:    Induction: Intravenous  PONV Risk Score and Plan: 1 and Ondansetron, Propofol infusion and Treatment may vary due to age or medical condition  Airway Management Planned: Nasal Cannula  Additional Equipment:   Intra-op Plan:   Post-operative Plan:   Informed Consent: I have reviewed the patients History and Physical, chart, labs and discussed the procedure including the risks, benefits and alternatives for the proposed anesthesia with the patient or authorized representative who has indicated his/her understanding and acceptance.     Dental advisory given  Plan Discussed with: CRNA, Anesthesiologist and Surgeon  Anesthesia Plan Comments:         Anesthesia Quick Evaluation

## 2021-07-04 NOTE — Op Note (Signed)
[facility] Patient Name: [name] Procedure Date : 07/04/2021 MRN: [id number] Attending MD: Georgian Co ,  Date of Birth: September 27, 1940 CSN: [id number] Age: 80 Admit Type: Inpatient Procedure:                Small bowel enteroscopy Indications:              Anemia, Melena Providers:                Adline Mango" Pollyann Glen Person, Janee Morn, Technician Referring MD:             Hospitalist team Medicines:                Monitored Anesthesia Care Complications:            No immediate complications. Estimated Blood Loss:     Estimated blood loss was minimal. Procedure:                Pre-Anesthesia Assessment:                           - Prior to the procedure, a History and Physical                            was performed, and patient medications and                            allergies were reviewed. The patient's tolerance of                            previous anesthesia was also reviewed. The risks                            and benefits of the procedure and the sedation                            options and risks were discussed with the patient.                            All questions were answered, and informed consent                            was obtained. Prior Anticoagulants: The patient has                            taken no previous anticoagulant or antiplatelet                            agents. ASA Grade Assessment: III - A patient with                            severe systemic disease. After reviewing the risks  and benefits, the patient was deemed in                            satisfactory condition to undergo the procedure.                           After obtaining informed consent, the endoscope was                            passed under direct vision. Throughout the                            procedure, the patient's blood pressure, pulse, and                             oxygen saturations were monitored continuously. The                            [id number]-HQ190TL ([id number] Olympus peds colonoscope was                            introduced through the mouth and advanced to the                            proximal jejunum. The small bowel enteroscopy was                            accomplished without difficulty. The patient                            tolerated the procedure well. Scope In: Scope Out: Findings:      The examined esophagus was normal.      A single 2 mm angioectasia with no bleeding was found in the gastric       body. Coagulation for bleeding prevention using argon plasma at 0.8       liters/minute and 20 watts was successful.      A single angioectasia with no bleeding was found in the fourth portion       of the duodenum. Coagulation for bleeding prevention using argon plasma       at 0.8 liters/minute and 20 watts was successful.      Two angioectasias with no bleeding were found in the proximal jejunum.       Coagulation for bleeding prevention using argon plasma at 0.8       liters/minute and 20 watts was successful. Impression:               - Normal esophagus.                           - A single non-bleeding angioectasia in the                            stomach. Treated with argon plasma coagulation                            (  APC).                           - A single non-bleeding angioectasia in the                            duodenum. Treated with argon plasma coagulation                            (APC).                           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                            Treated with argon plasma coagulation (APC).                           - No specimens collected. Recommendation:           - Return patient to hospital ward for ongoing care.                           - Use a proton pump inhibitor PO daily.                           - Start SQ octreotide 50 mcg BID to prevent                             bleeding due to angioectasias.                           - The findings and recommendations were discussed                            with the patient and/or primary team Procedure Code(s):        --- Professional ---                           [phone], Small intestinal endoscopy, enteroscopy                            beyond second portion of duodenum, not including                            ileum; with control of bleeding (eg, injection,                            bipolar cautery, unipolar cautery, laser, heater                            probe, stapler, plasma coagulator) Diagnosis Code(s):        --- Professional ---                           I09.735, Angiodysplasia of stomach and duodenum  without bleeding                           K55.20, Angiodysplasia of colon without hemorrhage                           D64.9, Anemia, unspecified                           K92.1, Melena (includes Hematochezia) CPT copyright 2019 [employer]. All rights reserved. The codes documented in this report are preliminary and upon coder review may  be revised to meet current compliance requirements. [address]Christia Reading,  07/04/2021 12:22:07 PM Number of Addenda: 0

## 2021-07-04 NOTE — Assessment & Plan Note (Signed)
   Patient typically receives hemodialysis on Monday Wednesday and Friday  According to the wife the patient missed his hemodialysis session today  Consulting nephrology in the morning for resumption of hemodialysis while hospitalized  Monitoring renal function and electrolytes with serial chemistries  Fluid restricted renal diet

## 2021-07-04 NOTE — Interval H&P Note (Signed)
History and Physical Interval Note:  07/04/2021 11:13 AM  [name]  has presented today for surgery, with the diagnosis of Melena.  The various methods of treatment have been discussed with the patient and family. After consideration of risks, benefits and other options for treatment, the patient has consented to  Procedure(s): ENTEROSCOPY (N/A) as a surgical intervention.  The patient's history has been reviewed, patient examined, no change in status, stable for surgery.  I have reviewed the patient's chart and labs.  Questions were answered to the patient's satisfaction.     Sharyn Creamer

## 2021-07-04 NOTE — Assessment & Plan Note (Signed)
.   Continuing home regimen of lipid lowering therapy.  

## 2021-07-04 NOTE — Transfer of Care (Signed)
Immediate Anesthesia Transfer of Care Note  Patient: [name]  Procedure(s) Performed: ENTEROSCOPY  Patient Location: PACU  Anesthesia Type:MAC  Level of Consciousness: drowsy and patient cooperative  Airway & Oxygen Therapy: Patient Spontanous Breathing and Patient connected to nasal cannula oxygen  Post-op Assessment: Report given to RN, Post -op Vital signs reviewed and stable and Patient moving all extremities X 4  Post vital signs: Reviewed and stable  Last Vitals:  Vitals Value Taken Time  BP 98/60 07/04/21 1214  Temp    Pulse 66 07/04/21 1216  Resp 13 07/04/21 1216  SpO2 100 % 07/04/21 1216  Vitals shown include unvalidated device data.  Last Pain:  Vitals:   07/04/21 1110  TempSrc: Oral  PainSc: 0-No pain         Complications: No notable events documented.

## 2021-07-04 NOTE — Assessment & Plan Note (Signed)
   Slightly elevated troponin   Serial troponins revealed a flat trajectory of elevation making plaque rupture unlikely  Patient currently chest pain-free  Monitoring patient on telemetry

## 2021-07-04 NOTE — Assessment & Plan Note (Signed)
   Mild disease, patient is still able to provide a history and make his own medical decision  Minimizing uncomfortable stimuli  Minimizing use of sedating agent  Fall precautions  Nursing to provide frequent redirection for the patient  Encouraging family to remain at bedside is much as possible

## 2021-07-04 NOTE — Consult Note (Addendum)
[facility] Renal Consultation Note    Indication for Consultation:  Management of ESRD/hemodialysis; anemia, hypertension/volume and secondary hyperparathyroidism  HPI: [name] is a 80 y.o. male with ESRD on HD, HTN, DM, PAD, dementia, h/o AVMs, recurrent GI bleeding. He is admitted under observation status for evaluation of abdominal pain with melena. Notable drop in outpatient hemoglobin from 10.0 on 9/28 to 8.8 on 10/5. Hgb 8.2 on admission. GI consulted and underwent small bowel enteroscopy this am.   Seen and examined in PACU. He is alert and oriented. He has no specific complaints. He can't recall which access is used for dialysis.   Dialysis MWF at [facility]. Compliant with dialysis treatments. He has RUE AVF and R chest TDC in place.   Past Medical History:  Diagnosis Date   Allergy    Alzheimer's disease ([place]) 01/19/2021   Anemia    Arthritis    Cataract    bil cateracts removed   Coronary artery disease    Dementia arising in the senium and presenium ([place])    Diabetes mellitus    Type 2   Diverticulitis    ED (erectile dysfunction)    Elevated homocysteine    ESRD (end stage renal disease) on dialysis ([place]) 03/2015   M-W-F dialysis   GERD (gastroesophageal reflux disease)    pepto    Gout    Headache, unspecified 02/25/2015   Hiatal hernia    Hyperlipidemia    Hypertension    Hypothyroidism    Pneumonia    PVD (peripheral vascular disease) ([place])    has plastic aorta   Renal insufficiency    Seasonal allergies    Sleep apnea    does not wear c-pap   Past Surgical History:  Procedure Laterality Date   ANGIOPLASTY Right 01/26/2021   Procedure: ANGIOPLASTY RIGHT SUBCLAVIAN VEIN;  Surgeon: Serafina Mitchell, MD;  Location: [place];  Service: Vascular;  Laterality: Right;   aortobifemoral bypass     AV FISTULA PLACEMENT Left 12/01/2013   Procedure: ARTERIOVENOUS (AV) FISTULA CREATION- LEFT BRACHIOCEPHALIC;  Surgeon:  Angelia Mould, MD;  Location: [place];  Service: Vascular;  Laterality: Left;   [place] Right 07/27/2014   Procedure: BASCILIC VEIN TRANSPOSITION;  Surgeon: Angelia Mould, MD;  Location: [place];  Service: Vascular;  Laterality: Right;   BRAIN SURGERY  07/22/2018   BREAST SURGERY     left - granulomatous mastitis   BURR HOLE Bilateral 07/22/2018   Procedure: BILATERAL BURR HOLES;  Surgeon: Kristeen Miss, MD;  Location: [place];  Service: Neurosurgery;  Laterality: Bilateral;   COLONOSCOPY     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 ([place] [facility])  1992   ENTEROSCOPY N/A 06/03/2018   Procedure: ENTEROSCOPY;  Surgeon: Lavena Bullion, DO;  [facility];  Service: Gastroenterology;  Laterality: N/A;   ENTEROSCOPY N/A 11/20/2018   Procedure: ENTEROSCOPY;  Surgeon: Rush Landmark Telford Nab., MD;  Location: [place];  Service: Gastroenterology;  Laterality: N/A;   ENTEROSCOPY N/A 12/03/2019   Procedure: ENTEROSCOPY;  Surgeon: Lavena Bullion, DO;  Location: [facility];  Service: Gastroenterology;  Laterality: N/A;  push enteroscopy   EYE SURGERY Bilateral    cataracts   FISTULOGRAM Right 01/26/2021   Procedure: FISTULOGRAM RIGHT;  Surgeon: Serafina Mitchell, MD;  Location: [place];  Service: Vascular;  Laterality: Right;   HEMODIALYSIS INPATIENT  01/17/2018       HOT HEMOSTASIS N/A 06/03/2018   Procedure: HOT HEMOSTASIS (  ARGON PLASMA COAGULATION/BICAP);  Surgeon: Lavena Bullion, DO;  Location: [facility] ENDOSCOPY;  Service: Gastroenterology;  Laterality: N/A;   HOT HEMOSTASIS N/A 11/20/2018   Procedure: HOT HEMOSTASIS (ARGON PLASMA COAGULATION/BICAP);  Surgeon: Irving Copas., MD;  Location: [place];  Service: Gastroenterology;  Laterality: N/A;   HOT HEMOSTASIS N/A 12/03/2019   Procedure: HOT HEMOSTASIS (ARGON PLASMA COAGULATION/BICAP);  Surgeon: Lavena Bullion, DO;  Location: [facility];  Service: Gastroenterology;  Laterality: N/A;   INTRAMEDULLARY  (IM) NAIL INTERTROCHANTERIC Right 11/08/2020   Procedure: INTRAMEDULLARY (IM) NAIL INTERTROCHANTRIC;  Surgeon: Erle Crocker, MD;  Location: [place];  Service: Orthopedics;  Laterality: Right;   REVISION OF ARTERIOVENOUS GORETEX GRAFT Right 09/01/2019   Procedure: REVISION OF ARTERIOVENOUS FISTULA RIGHT ARM;  Surgeon: Serafina Mitchell, MD;  Location: [place];  Service: Vascular;  Laterality: Right;   REVISON OF ARTERIOVENOUS FISTULA Left 02/09/2014   Procedure: REVISON OF LEFT ARTERIOVENOUS FISTULA - RESECTION OF RENDUNDANT VEIN;  Surgeon: Angelia Mould, MD;  Location: [place];  Service: Vascular;  Laterality: Left;   REVISON OF ARTERIOVENOUS FISTULA Right 01/26/2021   Procedure: REVISON OF ARTERIOVENOUS FISTULA RIGHT;  Surgeon: Serafina Mitchell, MD;  Location: [place];  Service: Vascular;  Laterality: Right;   SBO with lysis adhesions     SHUNTOGRAM Left 04/19/2014   Procedure: FISTULOGRAM;  Surgeon: Angelia Mould, MD;  Location: [facility] CATH LAB;  Service: Cardiovascular;  Laterality: Left;   UNILATERAL UPPER EXTREMEITY ANGIOGRAM N/A 07/12/2014   Procedure: UNILATERAL UPPER Anselmo Rod;  Surgeon: Angelia Mould, MD;  Location: [facility] CATH LAB;  Service: Cardiovascular;  Laterality: N/A;   Family History  Problem Relation Age of Onset   Aneurysm Mother        brain   Heart disease Father    Stroke Father    Hypertension Father    Diabetes Father    Dementia Neg Hx    Colon cancer Neg Hx    Esophageal cancer Neg Hx    Pancreatic cancer Neg Hx    Prostate cancer Neg Hx    Rectal cancer Neg Hx    Stomach cancer Neg Hx    Social History:  reports that he quit smoking about 26 years ago. His smoking use included cigarettes. He has never used smokeless tobacco. He reports that he does not drink alcohol and does not use drugs. Allergies  Allergen Reactions   Ambien [Zolpidem Tartrate] Other (See Comments)    Hallucinations and "felt crazy"    Penicillins Rash and Hives     Has patient had a PCN reaction causing immediate rash, facial/tongue/throat swelling, SOB or lightheadedness with hypotension: Yes Has patient had a PCN reaction causing severe rash involving mucus membranes or skin necrosis: Yes Has patient had a PCN reaction that required hospitalization: No Has patient had a PCN reaction occurring within the last 10 years: No If all of the above answers are "NO", then may proceed with Cephalosporin use.  Other reaction(s): HIVES   Prior to Admission medications   Medication Sig Start Date End Date Taking? Authorizing Provider  acetaminophen (TYLENOL) 325 MG tablet Take 1-2 tablets (325-650 mg total) by mouth every 4 (four) hours as needed for mild pain. 08/01/18  Yes Love, Ivan Anchors, PA-C  albuterol (VENTOLIN HFA) 108 (90 Base) MCG/ACT inhaler Inhale 1 puff into the lungs daily as needed for wheezing or shortness of breath.   Yes [provider]  allopurinol (ZYLOPRIM) 100 MG tablet Take 100 mg by  mouth daily.   Yes [provider]  camphor-menthol Timoteo Ace) lotion Apply 1 application topically 4 (four) times daily as needed for itching.    Yes [provider]  Carboxymethylcellulose Sodium (THERATEARS) 0.25 % SOLN Place 1 drop into both eyes as needed (for dryness).   Yes [provider]  cetirizine (ZYRTEC) 10 MG chewable tablet Chew 10 mg by mouth every Monday, Wednesday, and Friday.   Yes [provider]  clobetasol (TEMOVATE) 0.05 % external solution Apply 1 application topically 2 (two) times daily as needed (scalp irritation.).  04/19/15  Yes [provider]  donepezil (ARICEPT) 10 MG tablet Take 1 tablet (10 mg total) by mouth at bedtime. 07/28/20  Yes Melvenia Beam, MD  FLUOCINOLONE ACETONIDE SCALP 0.01 % OIL Apply 1 application topically daily as needed (scalp irritation.).  04/28/15  Yes [provider]  fluticasone (FLONASE) 50 MCG/ACT nasal spray Place 1 spray into both nostrils daily.  10/08/14  Yes Mikhail, Velta Addison, DO  hydrocortisone 2.5 % cream Apply 1 application topically 3 (three) times daily as needed (skin irritation (legs & arms)).  01/13/19  Yes [provider]  insulin aspart (NOVOLOG FLEXPEN) 100 UNIT/ML FlexPen INJECT 6-8 UNITS UNDER THE SKIN THREE TIMES DAILY BEFORE MEALS. DX:E11.65 Patient taking differently: Inject 6-8 Units into the skin See admin instructions. Inject 6-8 units under the skin three times daily before meals as needed for diabetes. 11/25/18  Yes Elayne Snare, MD  ketoconazole (NIZORAL) 2 % shampoo Apply 1 application topically 2 (two) times a week. Patient taking differently: Apply 1 application topically 2 (two) times a week. Sundays, and any other day 03/31/15  Yes Baxley, Cresenciano Lick, MD  levothyroxine (SYNTHROID, LEVOTHROID) 50 MCG tablet Take 1 tablet (50 mcg total) by mouth daily. Patient taking differently: Take 50 mcg by mouth daily before breakfast. 08/06/14  Yes Baxley, Cresenciano Lick, MD  lidocaine (LIDODERM) 5 % Place 1 patch onto the skin daily as needed (back pain).  11/12/19  Yes [provider]  memantine (NAMENDA) 10 MG tablet Take 1 tablet (10 mg total) by mouth 2 (two) times daily. 07/28/20  Yes Melvenia Beam, MD  midodrine (PROAMATINE) 10 MG tablet Take one pill prior to hemodialysis on MWF Patient taking differently: Take 5 mg by mouth See admin instructions. Take one tablet by mouth prior to hemodialysis on MWF 08/13/18  Yes Love, Ivan Anchors, PA-C  Nutritional Supplements (FEEDING SUPPLEMENT, NEPRO CARB STEADY,) LIQD Take 237 mLs by mouth in the morning and at bedtime.    Yes [provider]  omeprazole (PRILOSEC) 40 MG capsule Take 1 capsule (40 mg total) by mouth daily. 03/13/18  Yes Esterwood, Amy S, PA-C  sevelamer carbonate (RENVELA) 800 MG tablet Take 1,600-2,400 mg by mouth See admin instructions. Take 2,400 mg by mouth three times a day with meals and 1,600 mg with snacks on Sun/Tues/Thurs/Sat and 2,400 mg with  breakfast and dinner and nothing with snacks on Mon/Wed/Fri   Yes [provider]  simvastatin (ZOCOR) 20 MG tablet Take 20 mg by mouth at bedtime.   Yes [provider]  Teriparatide, Recombinant, (FORTEO) 600 MCG/2.4ML SOPN Inject 20 mcg subcutaneously daily 03/25/21  Yes Elayne Snare, MD  TRADJENTA 5 MG TABS tablet TAKE 1 TABLET DAILY Patient taking differently: Take 5 mg by mouth daily. 03/01/20  Yes Elayne Snare, MD  Blood Glucose Monitoring Suppl (FREESTYLE FREEDOM LITE) w/Device KIT Use to check blood sugar 2 times per day dx code E11.65 10/21/18  Elayne Snare, MD  cyanocobalamin (,VITAMIN B-12,) 1000 MCG/ML injection Inject 1 mL (1,000 mcg total) into the muscle every 30 (thirty) days. 05/18/21   Elby Showers, MD  doxycycline (VIBRAMYCIN) 100 MG capsule Take 1 capsule (100 mg total) by mouth 2 (two) times daily. Patient not taking: No sig reported 04/11/21   Petrucelli, Samantha R, PA-C  glucose blood (FREESTYLE LITE) test strip USE AS DIRECTED THREE TIMES DAILY 01/16/21   Elayne Snare, MD  HYDROcodone-acetaminophen (NORCO/VICODIN) 5-325 MG tablet Take 1 tablet by mouth every 4 (four) hours as needed for moderate pain. Patient not taking: No sig reported 01/26/21 01/26/22  Setzer, Edman Circle, PA-C  Insulin Pen Needle (PEN NEEDLES) 32G X 4 MM MISC 1 each by Does not apply route 3 (three) times daily. USE PEN NEEDLES TO INJECT INSULIN THREE TIMES DAILY. 11/25/18   Elayne Snare, MD  Lancets (FREESTYLE) lancets Use as instructed to check blood sugar 2 times per day dx code E11.65 02/22/16   Elayne Snare, MD   Current Facility-Administered Medications  Medication Dose Route Frequency Provider Last Rate Last Admin   0.9 %  sodium chloride infusion   Intravenous Continuous Sharyn Creamer, MD       0.9 %  sodium chloride infusion   Intravenous Continuous Sharyn Creamer, MD 10 mL/hr at 07/04/21 1114 New Bag at 07/04/21 1114   [MAR Hold] acetaminophen (TYLENOL) tablet 650 mg  650 mg Oral Q6H PRN  Shalhoub, Sherryll Burger, MD       Or   Doug Sou Hold] acetaminophen (TYLENOL) suppository 650 mg  650 mg Rectal Q6H PRN Shalhoub, Sherryll Burger, MD       [MAR Hold] albuterol (PROVENTIL) (2.5 MG/3ML) 0.083% nebulizer solution 3 mL  3 mL Inhalation Q6H PRN Shalhoub, Sherryll Burger, MD       [MAR Hold] allopurinol (ZYLOPRIM) tablet 100 mg  100 mg Oral Daily Shalhoub, Sherryll Burger, MD   100 mg at 07/04/21 1009   [MAR Hold] Chlorhexidine Gluconate Cloth 2 % PADS 6 each  6 each Topical Daily Shalhoub, Sherryll Burger, MD       [MAR Hold] donepezil (ARICEPT) tablet 10 mg  10 mg Oral QHS Shalhoub, Sherryll Burger, MD       [MAR Hold] feeding supplement (NEPRO CARB STEADY) liquid 237 mL  237 mL Oral TID BM Shalhoub, Sherryll Burger, MD   237 mL at 07/04/21 1011   [MAR Hold] fluticasone (FLONASE) 50 MCG/ACT nasal spray 1 spray  1 spray Each Nare Daily Shalhoub, Sherryll Burger, MD   1 spray at 07/04/21 1010   [MAR Hold] levothyroxine (SYNTHROID) tablet 50 mcg  50 mcg Oral Q0600 Vernelle Emerald, MD   50 mcg at 07/04/21 0844   [MAR Hold] memantine (NAMENDA) tablet 10 mg  10 mg Oral BID Vernelle Emerald, MD   10 mg at 07/04/21 1009   [MAR Hold] midodrine (PROAMATINE) tablet 5 mg  5 mg Oral Q M,W,F-HD Shalhoub, Sherryll Burger, MD       [MAR Hold] ondansetron [facility]) tablet 4 mg  4 mg Oral Q6H PRN Shalhoub, Sherryll Burger, MD       Or   Doug Sou Hold] ondansetron [facility]) injection 4 mg  4 mg Intravenous Q6H PRN Shalhoub, Sherryll Burger, MD       [MAR Hold] pantoprazole (PROTONIX) injection 40 mg  40 mg Intravenous Q12H Vernelle Emerald, MD   40 mg at 07/04/21 1009   [MAR Hold] sevelamer carbonate (RENVELA) tablet 1,600 mg  1,600 mg Oral BID BM PRN Shalhoub, Sherryll Burger, MD       [MAR Hold] sevelamer carbonate (RENVELA) tablet 2,400 mg  2,400 mg Oral TID WC Shalhoub, Sherryll Burger, MD   2,400 mg at 07/04/21 0842   [MAR Hold] simvastatin (ZOCOR) tablet 20 mg  20 mg Oral QHS Shalhoub, Sherryll Burger, MD         ROS: As per HPI otherwise negative.  Physical Exam: Vitals:   07/04/21  1245 07/04/21 1300 07/04/21 1315 07/04/21 1330  BP: (!) 141/89 134/69  128/71  Pulse: 67 68 66 66  Resp: 12 13 11 14   Temp:  97.7 F (36.5 C)    TempSrc:      SpO2: 100% 100% 100% 100%  Height:         General: Well appearing, elderly man, nad  Head: NCAT sclera not icteric MMM Neck: Supple. No JVD No masses Lungs: CTA bilaterally without wheezes, rales, or rhonchi. Breathing is unlabored. Heart: RRR with S1 S2 Abdomen: soft non-tender  Lower extremities:without edema or ischemic changes, no open wounds  Neuro: A & O  X 3. Moves all extremities spontaneously. Psych:  Responds to questions appropriately with a normal affect. Dialysis Access: RUE AVF +bruit; R IJ TDC in place   Labs: Basic Metabolic Panel: Recent Labs  Lab 07/03/21 1228 07/04/21 0432  NA 140 136  K 4.4 4.7  CL 102 100  CO2 25 20*  GLUCOSE 110* 70  BUN 40* 46*  CREATININE 8.66* 9.24*  CALCIUM 9.1 8.7*   Liver Function Tests: Recent Labs  Lab 07/03/21 1228 07/04/21 0432  AST 27 27  ALT 6 8  ALKPHOS 218* 210*  BILITOT 0.4 0.8  PROT 5.8* 5.4*  ALBUMIN 2.9* 2.5*   Recent Labs  Lab 07/03/21 1228  LIPASE 37   No results for input(s): AMMONIA in the last 168 hours. CBC: Recent Labs  Lab 07/03/21 1228 07/03/21 1835 07/04/21 0432  WBC 8.7 8.4 6.1  NEUTROABS 6.4 5.1  --   HGB 9.1* 9.3* 8.2*  HCT 29.6* 29.3* 27.3*  MCV 109.6* 108.1* 115.2*  PLT 203 215 182   Cardiac Enzymes: No results for input(s): CKTOTAL, CKMB, CKMBINDEX, TROPONINI in the last 168 hours. CBG: Recent Labs  Lab 07/04/21 0753 07/04/21 1216  GLUCAP 72 121*   Iron Studies: No results for input(s): IRON, TIBC, TRANSFERRIN, FERRITIN in the last 72 hours. Studies/Results: CT ABDOMEN PELVIS WO CONTRAST  Result Date: 07/03/2021 CLINICAL DATA:  Abdominal pain, left lower quadrant pain. EXAM: CT ABDOMEN AND PELVIS WITHOUT CONTRAST TECHNIQUE: Multidetector CT imaging of the abdomen and pelvis was performed following the  standard protocol without IV contrast. COMPARISON:  07/17/2020 FINDINGS: Lower chest: New small bilateral pleural effusions. No confluent opacities. Hepatobiliary: No focal hepatic abnormality. Gallbladder unremarkable. Pancreas: No focal abnormality or ductal dilatation. Spleen: No focal abnormality.  Normal size. Adrenals/Urinary Tract: Kidneys are atrophic with cortical thinning and numerous bilateral renal cysts. No hydronephrosis. No renal or ureteral stones. Adrenal glands and urinary bladder unremarkable. Stomach/Bowel: Sigmoid diverticulosis. No active diverticulitis. Stomach and small bowel decompressed, unremarkable. Vascular/Lymphatic: Heavily calcified aorta. Status post aortobifemoral bypass. No evidence of aneurysm or adenopathy. Reproductive: Central calcifications within the prostate. Other: No free fluid or free air. Musculoskeletal: No acute bony abnormality. IMPRESSION: Small bilateral pleural effusions. Few scattered sigmoid diverticula.  No active diverticulitis. Renal cortical thinning and atrophy with numerous renal cysts. No hydronephrosis. Prior aorto bi femoral bypass. No acute findings. Electronically Signed  By: Rolm Baptise M.D.   On: 07/03/2021 18:15   DG Chest 2 View  Result Date: 07/03/2021 CLINICAL DATA:  Chest pain. EXAM: CHEST - 2 VIEW COMPARISON:  January 12, 2021. FINDINGS: Mild enlargement the cardiac silhouette. Right IJ dual lumen dialysis catheter with the tip projecting at the right atrium. Calcific atherosclerosis of the aorta. No consolidation. Small bilateral pleural effusions. Mild diffuse interstitial prominence. IMPRESSION: Small bilateral effusions. Mild diffuse interstitial prominence, which may represent mild interstitial edema versus the sequela of recurrent bouts of congestive heart failure. Electronically Signed   By: Margaretha Sheffield M.D.   On: 07/03/2021 13:07    Dialysis Orders:   [facility] MWF 4h 400/A 1.5x EDW 65kg 3K/2.5Ca UFP 2  Hectrol 1 TIW Venofer  100 mg x10 (until 10/17) Mircera 150 q 2 wks (last 9/28)   Assessment/Plan:  GIB/Melena - GI following s/p enteroscopy today --nonbleeding AVMs  treated with APC.   ESRD -  HD MWF. No urgent indications today. Continue on schedule. Next HD 10/12.   Hypertension/volume  - BP/volume stable.   Anemia  - Hgb 8.2. Next ESA due 10/12 --will order with HD tomorrow. Continue IV Fe load.   Metabolic bone disease -  Ca ok. Continue home binders.   Nutrition - Renal diet/fluid restriction.   Lynnda Child PA-C [facility] 07/04/2021, 2:00 PM

## 2021-07-04 NOTE — Assessment & Plan Note (Signed)
.   Resume home regimen of Synthroid 

## 2021-07-04 NOTE — Progress Notes (Signed)
[facility] ED039[facility]) Hospital Liaison note:  This patient is currently enrolled in [facility] outpatient-based Palliative Care. Will continue to follow for disposition.  Please call with any outpatient palliative questions or concerns.  Thank you, Lorelee Market, LPN [facility] Liaison [phone]

## 2021-07-04 NOTE — Consult Note (Signed)
                                                                           [facility] Consult: 9:31 AM 07/04/2021  LOS: 0 days    Referring Provider: Dr Anderson MD  Primary Care Physician:  Baxley, Mary J, MD Primary Gastroenterologist:  Dr. Cirigliano    Reason for Consultation: GI bleed with melena, anemia.   HPI: [name] is a 79 y.o. male.  Hx ESRD on hemodialysis.  CAD.  DM 2.  Hypothyroidism.  Hypertension.  Diverticulitis.   AAA repair 1992.  Subdural hematoma evacuation 2019.  Anemia of chronic dz and of GI blood loss, PRBCs in 05/2018, 10/2018.   History of GI bleeds manifesting as melena and acute on chronic anemia dating back to 2019 with findings of upper intestinal AVMs.  10/2017 EGD.  Nonobstructing, mild distal esophageal stricture was dilated.  Otherwise normal study. 10/2017 colonoscopy.  Tubular adenomatous polyp removed from transverse colon, hyperplastic polyp removed from rectosigmoid.  Solitary, nonbleeding erosion at transverse colon with no stigmata of or active bleeding.  Scattered small and large diverticula throughout the colon.  Nonbleeding, small internal hemorrhoids 05/2018 SBE: AVM with stigmata of recent bleeding at gastric fundis treated with APC. 10/2018 SBE. For anemia, obscure GI bleeding, history of gastric AVM.  Findings of a single, nonbleeding gastric AVM treated with APC.  Gastric erythema.  2 spots of oozing in the duodenum treated with APC.  3, nonbleeding AVMs in jejunum treated with APC.  Tattoo placed at distal extent of exam in jejunum 02/2019 capsule endoscopy.  No active bleeding or oozing.  There were few tiny red spots, possible AVMs in mid small bowel at 2 hours 30 minutes.  Distal, nonbleeding, small bowel AVMs at 7 hours 13 minutes 11/2019 SBE.  Sliding hiatal hernia.  Nonobstructing Schatzki's ring.  2, nonbleeding, AVMs in third and  fourth duodenum treated with APC.  3, nonbleeding, AVMs in proximal jejunum treated with APC.  Previously placed tattoos visualized.  Brought to ED yesterday.  Patient says he has moderate to severe diffuse pain, though most prominent in lower abdomen, loose stools chronically for 2 weeks but it worsened yesterday.  Melena present for 2 to 3 days.  Does not use NSAIDs.  Does not drink alcohol.  No iron supplementation. On questioning the patient today he has difficulty recalling any recent events though he does recall soft to loose stools but not sure about the color.  Currently denies abdominal pain and nausea. Did not go to dialysis yesterday.  No shortness of breath or extremity edema.  No hypotension, fever, tachycardia, hypoxia. Hgb was 10.5 in July and August.  Since arrival in ED yesterday Hgb 9.1 >> 8.2.  MCV 115.  Platelets, INR, WBCs normal.  Alkaline phosphatase 218, not new.  Other LFTs and lipase normal.  FOBT positive.    Noncontrast CTAP: Scattered, uncomplicated sigmoid diverticula.  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  Previous aortobifemoral bypass.  Small bilateral pleural effusions.  Lives with his wife in [place].  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)    Diabetes mellitus    Type 2   Diverticulitis    ED (erectile dysfunction)    Elevated homocysteine    ESRD (end stage renal disease) on dialysis (HCC) 03/2015   M-W-F dialysis   GERD (gastroesophageal reflux disease)    pepto    Gout    Headache, unspecified 02/25/2015   Hiatal hernia    Hyperlipidemia    Hypertension    Hypothyroidism    Pneumonia    PVD (peripheral vascular disease) (HCC)    has plastic aorta   Renal insufficiency    Seasonal allergies    Sleep apnea    does not wear c-pap    Past Surgical History:   Procedure Laterality Date   ANGIOPLASTY Right 01/26/2021   Procedure: ANGIOPLASTY RIGHT SUBCLAVIAN VEIN;  Surgeon: Brabham, Vance W, MD;  Location: [facility];  Service: Vascular;  Laterality: Right;   aortobifemoral bypass     AV FISTULA PLACEMENT Left 12/01/2013   Procedure: ARTERIOVENOUS (AV) FISTULA CREATION- LEFT BRACHIOCEPHALIC;  Surgeon: Christopher S Dickson, MD;  Location: [facility];  Service: Vascular;  Laterality: Left;   BASCILIC VEIN TRANSPOSITION Right 07/27/2014   Procedure: BASCILIC VEIN TRANSPOSITION;  Surgeon: Christopher S Dickson, MD;  Location: [facility];  Service: Vascular;  Laterality: Right;   BRAIN SURGERY  07/22/2018   BREAST SURGERY     left - granulomatous mastitis   BURR HOLE Bilateral 07/22/2018   Procedure: BILATERAL BURR HOLES;  Surgeon: Elsner, Henry, MD;  Location: [facility];  Service: Neurosurgery;  Laterality: Bilateral;   COLONOSCOPY     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 ([facility] HX)  1992   ENTEROSCOPY N/A 06/03/2018   Procedure: ENTEROSCOPY;  Surgeon: Cirigliano, Vito V, DO;  [facility]: [facility];  Service: Gastroenterology;  Laterality: N/A;   ENTEROSCOPY N/A 11/20/2018   Procedure: ENTEROSCOPY;  Surgeon: Mansouraty, Gabriel Jr., MD;  Location: [facility];  Service: Gastroenterology;  Laterality: N/A;   ENTEROSCOPY N/A 12/03/2019   Procedure: ENTEROSCOPY;  Surgeon: Cirigliano, Vito V, DO;  Location: [facility];  Service: Gastroenterology;  Laterality: N/A;  push enteroscopy   EYE SURGERY Bilateral    cataracts   FISTULOGRAM Right 01/26/2021   Procedure: FISTULOGRAM RIGHT;  Surgeon: Brabham, Vance W, MD;  [facility]: [facility];  Service: Vascular;  Laterality: Right;   HEMODIALYSIS INPATIENT  01/17/2018       HOT HEMOSTASIS N/A 06/03/2018   Procedure: HOT HEMOSTASIS (ARGON PLASMA COAGULATION/BICAP);  Surgeon: Cirigliano, Vito V, DO;  [facility]: [facility];  Service: Gastroenterology;  Laterality: N/A;   HOT HEMOSTASIS N/A 11/20/2018   Procedure: HOT HEMOSTASIS (ARGON PLASMA  COAGULATION/BICAP);  Surgeon: Mansouraty, Gabriel Jr., MD;  [facility];  Service: Gastroenterology;  Laterality: N/A;   HOT HEMOSTASIS N/A 12/03/2019   Procedure: HOT HEMOSTASIS (ARGON PLASMA COAGULATION/BICAP);  Surgeon: Cirigliano, Vito V, DO;  Location: [facility];  Service: Gastroenterology;  Laterality: N/A;   INTRAMEDULLARY (IM) NAIL INTERTROCHANTERIC Right 11/08/2020   Procedure: INTRAMEDULLARY (IM) NAIL INTERTROCHANTRIC;  Surgeon: Adair, Christopher R, MD;  [facility]: [facility];  Service: Orthopedics;  Laterality: Right;   REVISION OF ARTERIOVENOUS GORETEX GRAFT Right 09/01/2019   Procedure: REVISION OF ARTERIOVENOUS FISTULA RIGHT ARM;  Surgeon: Brabham, Vance W, MD;  [facility]: [facility];  Service: Vascular;  Laterality: Right;   REVISON OF ARTERIOVENOUS FISTULA Left 02/09/2014   Procedure: REVISON OF LEFT   ARTERIOVENOUS FISTULA - RESECTION OF RENDUNDANT VEIN;  Surgeon: Christopher S Dickson, MD;  [facility]: [facility];  Service: Vascular;  Laterality: Left;   REVISON OF ARTERIOVENOUS FISTULA Right 01/26/2021   Procedure: REVISON OF ARTERIOVENOUS FISTULA RIGHT;  Surgeon: Brabham, Vance W, MD;  [facility];  Service: Vascular;  Laterality: Right;   SBO with lysis adhesions     SHUNTOGRAM Left 04/19/2014   Procedure: FISTULOGRAM;  Surgeon: Christopher S Dickson, MD;  [facility]: [facility];  Service: Cardiovascular;  Laterality: Left;   UNILATERAL UPPER EXTREMEITY ANGIOGRAM N/A 07/12/2014   Procedure: UNILATERAL UPPER EXTREMEITY ANGIOGRAM;  Surgeon: Christopher S Dickson, MD;  [facility]: [facility];  Service: Cardiovascular;  Laterality: N/A;    Prior to Admission medications   Medication Sig Start Date End Date Taking? Authorizing Provider  acetaminophen (TYLENOL) 325 MG tablet Take 1-2 tablets (325-650 mg total) by mouth every 4 (four) hours as needed for mild pain. 08/01/18  Yes Love, Pamela S, PA-C  albuterol (VENTOLIN HFA) 108 (90 Base) MCG/ACT inhaler Inhale 1 puff into the lungs daily as  needed for wheezing or shortness of breath.   Yes [provider]  allopurinol (ZYLOPRIM) 100 MG tablet Take 100 mg by mouth daily.   Yes [provider]  camphor-menthol (SARNA) lotion Apply 1 application topically 4 (four) times daily as needed for itching.    Yes [provider]  Carboxymethylcellulose Sodium (THERATEARS) 0.25 % SOLN Place 1 drop into both eyes as needed (for dryness).   Yes [provider]  cetirizine (ZYRTEC) 10 MG chewable tablet Chew 10 mg by mouth every Monday, Wednesday, and Friday.   Yes [provider]  clobetasol (TEMOVATE) 0.05 % external solution Apply 1 application topically 2 (two) times daily as needed (scalp irritation.).  04/19/15  Yes [provider]  donepezil (ARICEPT) 10 MG tablet Take 1 tablet (10 mg total) by mouth at bedtime. 07/28/20  Yes Ahern, Antonia B, MD  FLUOCINOLONE ACETONIDE SCALP 0.01 % OIL Apply 1 application topically daily as needed (scalp irritation.).  04/28/15  Yes [provider]  fluticasone (FLONASE) 50 MCG/ACT nasal spray Place 1 spray into both nostrils daily. 10/08/14  Yes Mikhail, Maryann, DO  hydrocortisone 2.5 % cream Apply 1 application topically 3 (three) times daily as needed (skin irritation (legs & arms)).  01/13/19  Yes [provider]  insulin aspart (NOVOLOG FLEXPEN) 100 UNIT/ML FlexPen INJECT 6-8 UNITS UNDER THE SKIN THREE TIMES DAILY BEFORE MEALS. DX:E11.65 Patient taking differently: Inject 6-8 Units into the skin See admin instructions. Inject 6-8 units under the skin three times daily before meals as needed for diabetes. 11/25/18  Yes Kumar, Ajay, MD  ketoconazole (NIZORAL) 2 % shampoo Apply 1 application topically 2 (two) times a week. Patient taking differently: Apply 1 application topically 2 (two) times a week. Sundays, and any other day 03/31/15  Yes Baxley, Mary J, MD  levothyroxine (SYNTHROID, LEVOTHROID) 50 MCG tablet Take 1 tablet (50 mcg total) by  mouth daily. Patient taking differently: Take 50 mcg by mouth daily before breakfast. 08/06/14  Yes Baxley, Mary J, MD  lidocaine (LIDODERM) 5 % Place 1 patch onto the skin daily as needed (back pain).  11/12/19  Yes [provider]  memantine (NAMENDA) 10 MG tablet Take 1 tablet (10 mg total) by mouth 2 (two) times daily. 07/28/20  Yes Ahern, Antonia B, MD  midodrine (PROAMATINE) 10 MG tablet Take one pill prior to hemodialysis on MWF Patient taking differently: Take 5   mg by mouth See admin instructions. Take one tablet by mouth prior to hemodialysis on MWF 08/13/18  Yes Love, Ivan Anchors, PA-C  Nutritional Supplements (FEEDING SUPPLEMENT, NEPRO CARB STEADY,) LIQD Take 237 mLs by mouth in the morning and at bedtime.    Yes [provider]  omeprazole (PRILOSEC) 40 MG capsule Take 1 capsule (40 mg total) by mouth daily. 03/13/18  Yes Esterwood, Amy S, PA-C  sevelamer carbonate (RENVELA) 800 MG tablet Take 1,600-2,400 mg by mouth See admin instructions. Take 2,400 mg by mouth three times a day with meals and 1,600 mg with snacks on Sun/Tues/Thurs/Sat and 2,400 mg with breakfast and dinner and nothing with snacks on Mon/Wed/Fri   Yes [provider]  simvastatin (ZOCOR) 20 MG tablet Take 20 mg by mouth at bedtime.   Yes [provider]  Teriparatide, Recombinant, (FORTEO) 600 MCG/2.4ML SOPN Inject 20 mcg subcutaneously daily 03/25/21  Yes Elayne Snare, MD  TRADJENTA 5 MG TABS tablet TAKE 1 TABLET DAILY Patient taking differently: Take 5 mg by mouth daily. 03/01/20  Yes Elayne Snare, MD  Blood Glucose Monitoring Suppl (FREESTYLE FREEDOM LITE) w/Device KIT Use to check blood sugar 2 times per day dx code E11.65 10/21/18   Elayne Snare, MD  cyanocobalamin (,VITAMIN B-12,) 1000 MCG/ML injection Inject 1 mL (1,000 mcg total) into the muscle every 30 (thirty) days. 05/18/21   Elby Showers, MD  doxycycline (VIBRAMYCIN) 100 MG capsule Take 1 capsule (100 mg total) by mouth 2 (two) times  daily. Patient not taking: No sig reported 04/11/21   Petrucelli, Samantha R, PA-C  glucose blood (FREESTYLE LITE) test strip USE AS DIRECTED THREE TIMES DAILY 01/16/21   Elayne Snare, MD  HYDROcodone-acetaminophen (NORCO/VICODIN) 5-325 MG tablet Take 1 tablet by mouth every 4 (four) hours as needed for moderate pain. Patient not taking: No sig reported 01/26/21 01/26/22  Setzer, Edman Circle, PA-C  Insulin Pen Needle (PEN NEEDLES) 32G X 4 MM MISC 1 each by Does not apply route 3 (three) times daily. USE PEN NEEDLES TO INJECT INSULIN THREE TIMES DAILY. 11/25/18   Elayne Snare, MD  Lancets (FREESTYLE) lancets Use as instructed to check blood sugar 2 times per day dx code E11.65 02/22/16   Elayne Snare, MD    Scheduled Meds:  allopurinol  100 mg Oral Daily   Chlorhexidine Gluconate Cloth  6 each Topical Daily   donepezil  10 mg Oral QHS   feeding supplement (NEPRO CARB STEADY)  237 mL Oral TID BM   fluticasone  1 spray Each Nare Daily   levothyroxine  50 mcg Oral Q0600   memantine  10 mg Oral BID   [START ON 07/05/2021] midodrine  5 mg Oral Q M,W,F-HD   pantoprazole (PROTONIX) IV  40 mg Intravenous Q12H   sevelamer carbonate  2,400 mg Oral TID WC   simvastatin  20 mg Oral QHS   Infusions:  PRN Meds: acetaminophen **OR** acetaminophen, albuterol, ondansetron **OR** ondansetron (ZOFRAN) IV, sevelamer carbonate   Allergies as of 07/03/2021 - Review Complete 07/03/2021  Allergen Reaction Noted   Ambien [zolpidem tartrate] Other (See Comments) 11/21/2018   Penicillins Rash and Hives 12/27/2000    Family History  Problem Relation Age of Onset   Aneurysm Mother        brain   Heart disease Father    Stroke Father    Hypertension Father    Diabetes Father    Dementia Neg Hx    Colon cancer Neg Hx  Esophageal cancer Neg Hx    Pancreatic cancer Neg Hx    Prostate cancer Neg Hx    Rectal cancer Neg Hx    Stomach cancer Neg Hx     Social History   Socioeconomic History   Marital status:  Married    Spouse name: [name] Mood   Number of children: 1   Years of education: 16   Highest education level: Not on file  Occupational History   Occupation: Retired  Tobacco Use   Smoking status: Former    Types: Cigarettes    Quit date: 09/24/1994    Years since quitting: 26.7   Smokeless tobacco: Never  Vaping Use   Vaping Use: Never used  Substance and Sexual Activity   Alcohol use: No    Alcohol/week: 0.0 standard drinks    Comment: Quit Oct. 1977 ("somewhat heavy")   Drug use: No   Sexual activity: Not on file  Other Topics Concern   Not on file  Social History Narrative   Lives at home with wife.   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 ENT:  No nose bleeds Pulm: Denies shortness of breath, denies cough. CV:  No palpitations, no LE edema.  Denies chest pressure/pain. GU:  No hematuria, no frequency GI: No difficulty swallowing.  No nausea. Heme: No excessive bleeding but there is bruising at the right upper extremity where he has recently undergone some sort of dialysis access procedure. Transfusions: Per HPI Neuro:  No headaches, no peripheral tingling or numbness Derm:  No itching, no rash or sores.  Endocrine:  No sweats or chills.  No polyuria or dysuria Immunization: Reviewed. Travel:  None    PHYSICAL EXAM: Vital signs in last 24 hours: Vitals:   07/04/21  0400 07/04/21 0700  BP: (!) 163/71 (!) 164/47  Pulse: 69 74  Resp: 13 12  Temp:    SpO2: 99% 100%   Wt Readings from Last 3 Encounters:  05/18/21 65.3 kg  05/16/21 66.7 kg  05/11/21 66 kg    General: Morbidly obese, looks chronically ill but comfortable on stretcher in ED. Head: No facial asymmetry or swelling.  No signs of head trauma. Eyes: Conjunctiva pink.  No scleral icterus. Ears: Slightly hard of hearing Nose: No congestion or discharge Mouth: Upper denture in place.  Mucosa is moist, pink, clear.  Tongue midline. Neck: No mass, no JVD Lungs: Clear bilaterally though diminished at the bases.  No labored breathing or cough. Heart: RRR.  No MRG.  S1, S2 present. Abdomen: Soft, obese.  Not tender.  No HSM, masses, bruits, hernias.  Midline surgical scar is well-healed but significant..   Rectal: Deferred Musc/Skeltl: No joint redness, swelling or gross deformity. Extremities: No CCE.  Feet are warm. Neurologic: Alert.  Follows commands.  Appropriate.  Moves all 4 limbs without tremor or  gross weakness.  Not oriented to month, year.  Cannot tell me his address but knows he lives in [place].  Knows he is at the hospital. Skin: No rash, no sores, no telangiectasia.  Bruising evident at right upper extremity where there is an intact surgical scar. Nodes: No cervical adenopathy Psych: Cooperative, calm, pleasant.  Follows commands.  Intake/Output from previous day: No intake/output data recorded. Intake/Output this shift: No intake/output data recorded.  LAB RESULTS: Recent Labs    07/03/21 1228 07/03/21 1835 07/04/21 0432  WBC 8.7 8.4 6.1  HGB 9.1* 9.3* 8.2*  HCT 29.6* 29.3* 27.3*  PLT 203 215 182   BMET Lab Results  Component Value Date   NA 136 07/04/2021   NA 140 07/03/2021   NA 144 05/16/2021   K 4.7 07/04/2021   K 4.4 07/03/2021   K 4.2 05/16/2021   CL 100 07/04/2021   CL 102 07/03/2021   CL 102 05/16/2021   CO2 20 (L) 07/04/2021   CO2 25  07/03/2021   CO2 29 05/16/2021   GLUCOSE 70 07/04/2021   GLUCOSE 110 (H) 07/03/2021   GLUCOSE 80 05/16/2021   BUN 46 (H) 07/04/2021   BUN 40 (H) 07/03/2021   BUN 22 05/16/2021   CREATININE 9.24 (H) 07/04/2021   CREATININE 8.66 (H) 07/03/2021   CREATININE 5.46 (H) 05/16/2021   CALCIUM 8.7 (L) 07/04/2021   CALCIUM 9.1 07/03/2021   CALCIUM 9.7 05/16/2021   LFT Recent Labs    07/03/21 1228 07/04/21 0432  PROT 5.8* 5.4*  ALBUMIN 2.9* 2.5*  AST 27 27  ALT 6 8  ALKPHOS 218* 210*  BILITOT 0.4 0.8   PT/INR Lab Results  Component Value Date   INR 1.0 01/12/2021   INR 0.97 07/22/2018   INR 1.01 06/05/2016   Hepatitis Panel No results for input(s): HEPBSAG, HCVAB, HEPAIGM, HEPBIGM in the last 72 hours. C-Diff No components found for: CDIFF Lipase     Component Value Date/Time   LIPASE 37 07/03/2021 1228    Drugs of Abuse  No results found for: LABOPIA, COCAINSCRNUR, LABBENZ, AMPHETMU, THCU, LABBARB   RADIOLOGY STUDIES: CT ABDOMEN PELVIS WO CONTRAST  Result Date: 07/03/2021 CLINICAL DATA:  Abdominal pain, left lower quadrant pain. EXAM: CT ABDOMEN AND PELVIS WITHOUT CONTRAST TECHNIQUE: Multidetector CT imaging of the abdomen and pelvis was performed following the standard protocol without IV contrast. COMPARISON:  07/17/2020 FINDINGS: Lower chest: New small bilateral pleural effusions. No confluent opacities. Hepatobiliary: No focal hepatic abnormality. Gallbladder unremarkable. Pancreas: No focal abnormality or ductal dilatation. Spleen: No focal abnormality.  Normal size. Adrenals/Urinary Tract: Kidneys are atrophic with cortical thinning and numerous bilateral renal cysts. No hydronephrosis. No renal or ureteral stones. Adrenal glands and urinary bladder unremarkable. Stomach/Bowel: Sigmoid diverticulosis. No active diverticulitis. Stomach and small bowel decompressed, unremarkable. Vascular/Lymphatic: Heavily calcified aorta. Status post aortobifemoral bypass. No evidence of  aneurysm or adenopathy. Reproductive: Central calcifications within the prostate. Other: No free fluid or free air. Musculoskeletal: No acute bony abnormality. IMPRESSION: Small bilateral pleural effusions. Few scattered sigmoid diverticula.  No active diverticulitis. Renal cortical thinning and atrophy with numerous renal cysts. No hydronephrosis. Prior aorto bi femoral bypass. No acute findings. Electronically Signed   By: Kevin  Dover M.D.   On: 07/03/2021 18:15   DG Chest 2 View  Result Date: 07/03/2021 CLINICAL DATA:  Chest pain. EXAM: CHEST - 2 VIEW COMPARISON:  January 12, 2021. FINDINGS: Mild enlargement the cardiac silhouette. Right IJ dual lumen dialysis catheter with the tip   projecting at the right atrium. Calcific atherosclerosis of the aorta. No consolidation. Small bilateral pleural effusions. Mild diffuse interstitial prominence. IMPRESSION: Small bilateral effusions. Mild diffuse interstitial prominence, which may represent mild interstitial edema versus the sequela of recurrent bouts of congestive heart failure. Electronically Signed   By: Margaretha Sheffield M.D.   On: 07/03/2021 13:07      IMPRESSION:      Recurrent GI bleeding manifesting as melena with associated drop in Hb.  Suspect AVMs are the source as he has had several AVMs, primarily in the upper GI tract, treated with APC in the past.  ESRD.  Did not undergo dialysis yesterday.  I called dialysis unit and he is not yet on the schedule for dialysis today.  Does not display symptoms or physical findings of volume overload.  Macrocytic anemia.  Folate level normal.  B12 level normal in 04/2021    PLAN:        SBE today.     Note pt is enrolled w outpt palliative care service and is DNR.     Azucena Freed  07/04/2021, 9:31 AM Phone [phone]

## 2021-07-04 NOTE — Progress Notes (Signed)
Pt arrived to [id number] from PACU. Report received from [place], [place]. Pt vitals WNL, alert & orientedx4, skin intact. [name], [name], was called and updated on his arrival.   Justice Rocher, RN

## 2021-07-04 NOTE — Progress Notes (Signed)
PROGRESS NOTE  [name]    DOB: 1941-01-29, 80 y.o.  WCH:[id number]  PCP: Elby Showers, MD   Code Status: DNR   DOA: 07/03/2021   LOS: 0  Brief Narrative of Current Hospitalization  [name] is a 80 y.o. 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 They presented from home to the ED on 07/03/2021 with abdominal pain and melena x 2-3 days. In the ED, it was found that they had GI bleed with decreasing hemoglobin. They were treated with IV fluids and Protonix.  GI was consulted for evaluation.  Patient was admitted to medicine service for further workup and management of GI bleed as outlined in detail below.  07/04/21 -continues to have melena, EGD today  Assessment & Plan  Principal Problem:   Melena Active Problems:   Essential hypertension   Hypothyroidism   ESRD on dialysis ([place])   Dementia without behavioral disturbance (HCC)   Elevated troponin level not due myocardial infarction   Mixed diabetic hyperlipidemia associated with type 2 diabetes mellitus (HCC)  GI bleed, unclear source, consider mesenteric ischemia given history of pain. Hemoglobin has continued to decrease 9.1>8.2 - GI was consulted in ED  -EGD was performed today which did not show active signs of bleeding but several potential bleeding sources were identified and treated endoscopically.  -Continue Protonix as well as octreotide -CBC a.m. -Continue IV maintenance fluid support -Advance diet as tolerated, per GI  ESRD on HD MWF. Labs appear to be stable despite missing Monday dialysis.  - consulting nephrology for routine dialysis tomorrow -Continue home Renvela  Mixed diabetic hyperlipidemia with T2DM. Hgb A1c 4.6 on admission which is well below goal for age. Fasting glucoses are in 70s. - discontinue insulin at this time. Glucose monitoring with other labs only -Continue home  simvastatin  Dementia without behavioral disturbance -Continue home memantine  Hypothyroidism -Continue home Synthroid  Blood pressure abnormalities- -Continue home midodrine  History of gout -Continue home allopurinol  DVT prophylaxis: SCDs Start: 07/04/21 0041   Diet:  Diet Orders (From admission, onward)     Start     Ordered   07/05/21 0001  Diet NPO time specified  Diet effective midnight        07/04/21 0043   07/04/21 0040  Diet renal with fluid restriction Fluid restriction: 1200 mL Fluid; Room service appropriate? Yes; Fluid consistency: Thin  Diet effective now       Question Answer Comment  Fluid restriction: 1200 mL Fluid   Room service appropriate? Yes   Fluid consistency: Thin      07/04/21 0043            Subjective 07/04/21    Pt reports a repeat BM while in room today and unsure what it looks like yet. Denies any pain, N/V/  Disposition Plan & Communication  Status is: Observation  The patient will require care spanning > 2 midnights and should be moved to inpatient because: Inpatient level of care appropriate due to severity of illness  Dispo: The patient is from: Home              Anticipated d/c is to: Home              Patient currently is not medically stable to d/c.    Family Communication: none   Consults, Procedures, Significant Events  Consultants:  Nephrology GI  Procedures/significant events:  EGD HD MWF  Antimicrobials:  Anti-infectives (From admission, onward)    None        Objective   Vitals:   07/04/21 0230 07/04/21 0300 07/04/21 0400 07/04/21 0700  BP: (!) 165/72 (!) 158/69 (!) 163/71 (!) 164/47  Pulse: 79 73 69 74  Resp: (!) 21 14 13 12   Temp:      SpO2: 99% 98% 99% 100%   No intake or output data in the 24 hours ending 07/04/21 0813 There were no vitals filed for this visit.  Patient BMI: There is no height or weight on file to calculate BMI.   Physical Exam: General: awake, alert, NAD HEENT:  atraumatic, clear conjunctiva, anicteric sclera, moist mucus membranes, hearing grossly normal Respiratory:normal respiratory effort. Cardiovascular:quick capillary refill  Gastrointestinal: soft, NT, ND, no HSM felt Nervous: A&O x3. no gross focal neurologic deficits, normal speech Extremities: moves all equally, no edema, normal tone Skin: dry, intact, normal temperature, normal color, No rashes, lesions or ulcers Psychiatry: normal mood, congruent affect  Labs   I have personally reviewed following labs and imaging studies Admission on 07/03/2021  Component Date Value Ref Range Status   Troponin I (High Sensitivity) 07/03/2021 36 (A) <18 ng/L Final   Sodium 07/03/2021 140  135 - 145 mmol/L Final   Potassium 07/03/2021 4.4  3.5 - 5.1 mmol/L Final   Chloride 07/03/2021 102  98 - 111 mmol/L Final   CO2 07/03/2021 25  22 - 32 mmol/L Final   Glucose, Bld 07/03/2021 110 (A) 70 - 99 mg/dL Final   BUN 07/03/2021 40 (A) 8 - 23 mg/dL Final   Creatinine, Ser 07/03/2021 8.66 (A) 0.61 - 1.24 mg/dL Final   Calcium 07/03/2021 9.1  8.9 - 10.3 mg/dL Final   Total Protein 07/03/2021 5.8 (A) 6.5 - 8.1 g/dL Final   Albumin 07/03/2021 2.9 (A) 3.5 - 5.0 g/dL Final   AST 07/03/2021 27  15 - 41 U/L Final   ALT 07/03/2021 6  0 - 44 U/L Final   Alkaline Phosphatase 07/03/2021 218 (A) 38 - 126 U/L Final   Total Bilirubin 07/03/2021 0.4  0.3 - 1.2 mg/dL Final   GFR, Estimated 07/03/2021 6 (A) >60 mL/min Final   Anion gap 07/03/2021 13  5 - 15 Final   WBC 07/03/2021 8.7  4.0 - 10.5 K/uL Final   RBC 07/03/2021 2.70 (A) 4.22 - 5.81 MIL/uL Final   Hemoglobin 07/03/2021 9.1 (A) 13.0 - 17.0 g/dL Final   HCT 07/03/2021 29.6 (A) 39.0 - 52.0 % Final   MCV 07/03/2021 109.6 (A) 80.0 - 100.0 fL Final   MCH 07/03/2021 33.7  26.0 - 34.0 pg Final   MCHC 07/03/2021 30.7  30.0 - 36.0 g/dL Final   RDW 07/03/2021 18.2 (A) 11.5 - 15.5 % Final   Platelets 07/03/2021 203  150 - 400 K/uL Final   nRBC 07/03/2021 0.2  0.0 -  0.2 % Final   Neutrophils Relative % 07/03/2021 73  % Final   Neutro Abs 07/03/2021 6.4  1.7 - 7.7 K/uL Final   Lymphocytes Relative 07/03/2021 16  % Final   Lymphs Abs 07/03/2021 1.4  0.7 - 4.0 K/uL Final   Monocytes Relative 07/03/2021 7  % Final   Monocytes Absolute 07/03/2021 0.6  0.1 - 1.0 K/uL Final   Eosinophils Relative 07/03/2021 2  % Final   Eosinophils Absolute 07/03/2021 0.2  0.0 - 0.5 K/uL Final   Basophils Relative 07/03/2021 1  % Final  Basophils Absolute 07/03/2021 0.1  0.0 - 0.1 K/uL Final   Immature Granulocytes 07/03/2021 1  % Final   Abs Immature Granulocytes 07/03/2021 0.05  0.00 - 0.07 K/uL Final   Lipase 07/03/2021 37  11 - 51 U/L Final   SARS Coronavirus 2 by RT PCR 07/03/2021 NEGATIVE  NEGATIVE Final   Influenza A by PCR 07/03/2021 NEGATIVE  NEGATIVE Final   Influenza B by PCR 07/03/2021 NEGATIVE  NEGATIVE Final   Troponin I (High Sensitivity) 07/03/2021 37 (A) <18 ng/L Final   Fecal Occult Bld 07/03/2021 POSITIVE (A) NEGATIVE Final   WBC 07/03/2021 8.4  4.0 - 10.5 K/uL Final   RBC 07/03/2021 2.71 (A) 4.22 - 5.81 MIL/uL Final   Hemoglobin 07/03/2021 9.3 (A) 13.0 - 17.0 g/dL Final   HCT 07/03/2021 29.3 (A) 39.0 - 52.0 % Final   MCV 07/03/2021 108.1 (A) 80.0 - 100.0 fL Final   MCH 07/03/2021 34.3 (A) 26.0 - 34.0 pg Final   MCHC 07/03/2021 31.7  30.0 - 36.0 g/dL Final   RDW 07/03/2021 17.9 (A) 11.5 - 15.5 % Final   Platelets 07/03/2021 215  150 - 400 K/uL Final   nRBC 07/03/2021 0.2  0.0 - 0.2 % Final   Neutrophils Relative % 07/03/2021 60  % Final   Neutro Abs 07/03/2021 5.1  1.7 - 7.7 K/uL Final   Lymphocytes Relative 07/03/2021 27  % Final   Lymphs Abs 07/03/2021 2.2  0.7 - 4.0 K/uL Final   Monocytes Relative 07/03/2021 9  % Final   Monocytes Absolute 07/03/2021 0.7  0.1 - 1.0 K/uL Final   Eosinophils Relative 07/03/2021 3  % Final   Eosinophils Absolute 07/03/2021 0.3  0.0 - 0.5 K/uL Final   Basophils Relative 07/03/2021 1  % Final   Basophils Absolute  07/03/2021 0.1  0.0 - 0.1 K/uL Final   Immature Granulocytes 07/03/2021 0  % Final   Abs Immature Granulocytes 07/03/2021 0.03  0.00 - 0.07 K/uL Final   Hgb A1c MFr Bld 07/04/2021 4.6 (A) 4.8 - 5.6 % Final   Mean Plasma Glucose 07/04/2021 85.32  mg/dL Final   Sodium 07/04/2021 136  135 - 145 mmol/L Final   Potassium 07/04/2021 4.7  3.5 - 5.1 mmol/L Final   Chloride 07/04/2021 100  98 - 111 mmol/L Final   CO2 07/04/2021 20 (A) 22 - 32 mmol/L Final   Glucose, Bld 07/04/2021 70  70 - 99 mg/dL Final   BUN 07/04/2021 46 (A) 8 - 23 mg/dL Final   Creatinine, Ser 07/04/2021 9.24 (A) 0.61 - 1.24 mg/dL Final   Calcium 07/04/2021 8.7 (A) 8.9 - 10.3 mg/dL Final   Total Protein 07/04/2021 5.4 (A) 6.5 - 8.1 g/dL Final   Albumin 07/04/2021 2.5 (A) 3.5 - 5.0 g/dL Final   AST 07/04/2021 27  15 - 41 U/L Final   ALT 07/04/2021 8  0 - 44 U/L Final   Alkaline Phosphatase 07/04/2021 210 (A) 38 - 126 U/L Final   Total Bilirubin 07/04/2021 0.8  0.3 - 1.2 mg/dL Final   GFR, Estimated 07/04/2021 5 (A) >60 mL/min Final   Anion gap 07/04/2021 16 (A) 5 - 15 Final   Magnesium 07/04/2021 2.0  1.7 - 2.4 mg/dL Final   WBC 07/04/2021 6.1  4.0 - 10.5 K/uL Final   RBC 07/04/2021 2.37 (A) 4.22 - 5.81 MIL/uL Final   Hemoglobin 07/04/2021 8.2 (A) 13.0 - 17.0 g/dL Final   HCT 07/04/2021 27.3 (A) 39.0 - 52.0 % Final  MCV 07/04/2021 115.2 (A) 80.0 - 100.0 fL Final   MCH 07/04/2021 34.6 (A) 26.0 - 34.0 pg Final   MCHC 07/04/2021 30.0  30.0 - 36.0 g/dL Final   RDW 07/04/2021 18.3 (A) 11.5 - 15.5 % Final   Platelets 07/04/2021 182  150 - 400 K/uL Final   nRBC 07/04/2021 0.5 (A) 0.0 - 0.2 % Final   Glucose-Capillary 07/04/2021 72  70 - 99 mg/dL Final    Imaging Studies  CT ABDOMEN PELVIS WO CONTRAST  Result Date: 07/03/2021 CLINICAL DATA:  Abdominal pain, left lower quadrant pain. EXAM: CT ABDOMEN AND PELVIS WITHOUT CONTRAST TECHNIQUE: Multidetector CT imaging of the abdomen and pelvis was performed following the standard  protocol without IV contrast. COMPARISON:  07/17/2020 FINDINGS: Lower chest: New small bilateral pleural effusions. No confluent opacities. Hepatobiliary: No focal hepatic abnormality. Gallbladder unremarkable. Pancreas: No focal abnormality or ductal dilatation. Spleen: No focal abnormality.  Normal size. Adrenals/Urinary Tract: Kidneys are atrophic with cortical thinning and numerous bilateral renal cysts. No hydronephrosis. No renal or ureteral stones. Adrenal glands and urinary bladder unremarkable. Stomach/Bowel: Sigmoid diverticulosis. No active diverticulitis. Stomach and small bowel decompressed, unremarkable. Vascular/Lymphatic: Heavily calcified aorta. Status post aortobifemoral bypass. No evidence of aneurysm or adenopathy. Reproductive: Central calcifications within the prostate. Other: No free fluid or free air. Musculoskeletal: No acute bony abnormality. IMPRESSION: Small bilateral pleural effusions. Few scattered sigmoid diverticula.  No active diverticulitis. Renal cortical thinning and atrophy with numerous renal cysts. No hydronephrosis. Prior aorto bi femoral bypass. No acute findings. Electronically Signed   By: Rolm Baptise M.D.   On: 07/03/2021 18:15   DG Chest 2 View  Result Date: 07/03/2021 CLINICAL DATA:  Chest pain. EXAM: CHEST - 2 VIEW COMPARISON:  January 12, 2021. FINDINGS: Mild enlargement the cardiac silhouette. Right IJ dual lumen dialysis catheter with the tip projecting at the right atrium. Calcific atherosclerosis of the aorta. No consolidation. Small bilateral pleural effusions. Mild diffuse interstitial prominence. IMPRESSION: Small bilateral effusions. Mild diffuse interstitial prominence, which may represent mild interstitial edema versus the sequela of recurrent bouts of congestive heart failure. Electronically Signed   By: Margaretha Sheffield M.D.   On: 07/03/2021 13:07   Medications   Scheduled Meds:  allopurinol  100 mg Oral Daily   Chlorhexidine Gluconate Cloth  6  each Topical Daily   donepezil  10 mg Oral QHS   feeding supplement (NEPRO CARB STEADY)  237 mL Oral TID BM   fluticasone  1 spray Each Nare Daily   insulin aspart  0-15 Units Subcutaneous TID WC   levothyroxine  50 mcg Oral Q0600   memantine  10 mg Oral BID   [START ON 07/05/2021] midodrine  5 mg Oral Q M,W,F-HD   pantoprazole (PROTONIX) IV  40 mg Intravenous Q12H   sevelamer carbonate  2,400 mg Oral TID WC   simvastatin  20 mg Oral QHS     LOS: 0 days   Time spent: >42min  Richarda Osmond, DO [facility] 07/04/2021, 8:13 AM   To contact the [facility] Attending or Consulting provider for this patient: Check the care team in [facility] for a) attending/consulting [place] provider listed and b) the [facility] team listed Log into [email or web address] and use [facility] universal password to access. If you do not have the password, please contact the hospital operator. Locate the [facility] provider you are looking for under [facility] and page to a number that you can be directly reached. If you still have difficulty reaching the  provider, please page the [facility] (Director on Call) for the Hospitalists listed on amion for assistance.

## 2021-07-04 NOTE — H&P (View-Only) (Signed)
[facility] Consult: 9:31 AM 07/04/2021  LOS: 0 days    Referring Provider: Dr Ouida Sills MD  Primary Care Physician:  Elby Showers, MD Primary Gastroenterologist:  Dr. Bryan Lemma    Reason for Consultation: GI bleed with melena, anemia.   HPI: [name] is a 80 y.o. male.  Hx ESRD on hemodialysis.  CAD.  DM 2.  Hypothyroidism.  Hypertension.  Diverticulitis.   AAA repair 1992.  Subdural hematoma evacuation 2019.  Anemia of chronic dz and of GI blood loss, PRBCs in 05/2018, 10/2018.   History of GI bleeds manifesting as melena and acute on chronic anemia dating back to 2019 with findings of upper intestinal AVMs.  10/2017 EGD.  Nonobstructing, mild distal esophageal stricture was dilated.  Otherwise normal study. 10/2017 colonoscopy.  Tubular adenomatous polyp removed from transverse colon, hyperplastic polyp removed from rectosigmoid.  Solitary, nonbleeding erosion at transverse colon with no stigmata of or active bleeding.  Scattered small and large diverticula throughout the colon.  Nonbleeding, small internal hemorrhoids 05/2018 SBE: AVM with stigmata of recent bleeding at gastric fundis treated with APC. 10/2018 SBE. For anemia, obscure GI bleeding, history of gastric AVM.  Findings of a single, nonbleeding gastric AVM treated with APC.  Gastric erythema.  2 spots of oozing in the duodenum treated with APC.  3, nonbleeding AVMs in jejunum treated with APC.  Tattoo placed at distal extent of exam in jejunum 02/2019 capsule endoscopy.  No active bleeding or oozing.  There were few tiny red spots, possible AVMs in mid small bowel at 2 hours 30 minutes.  Distal, nonbleeding, small bowel AVMs at 7 hours 13 minutes 11/2019 SBE.  Sliding hiatal hernia.  Nonobstructing Schatzki's ring.  2, nonbleeding, AVMs in third and  fourth duodenum treated with APC.  3, nonbleeding, AVMs in proximal jejunum treated with APC.  Previously placed tattoos visualized.  Brought to ED yesterday.  Patient says he has moderate to severe diffuse pain, though most prominent in lower abdomen, loose stools chronically for 2 weeks but it worsened yesterday.  Melena present for 2 to 3 days.  Does not use NSAIDs.  Does not drink alcohol.  No iron supplementation. On questioning the patient today he has difficulty recalling any recent events though he does recall soft to loose stools but not sure about the color.  Currently denies abdominal pain and nausea. Did not go to dialysis yesterday.  No shortness of breath or extremity edema.  No hypotension, fever, tachycardia, hypoxia. Hgb was 10.5 in July and August.  Since arrival in ED yesterday Hgb 9.1 >> 8.2.  MCV 115.  Platelets, INR, WBCs normal.  Alkaline phosphatase 218, not new.  Other LFTs and lipase normal.  FOBT positive.    Noncontrast CTAP: Scattered, uncomplicated sigmoid diverticula.  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  Previous aortobifemoral bypass.  Small bilateral pleural effusions.  Lives with his wife in [place].  Past Medical History:  Diagnosis Date   Allergy    Alzheimer's disease ([place]) 01/19/2021   Anemia    Arthritis  Cataract    bil cateracts removed   Coronary artery disease    Dementia arising in the senium and presenium ([place])    Diabetes mellitus    Type 2   Diverticulitis    ED (erectile dysfunction)    Elevated homocysteine    ESRD (end stage renal disease) on dialysis ([place]) 03/2015   M-W-F dialysis   GERD (gastroesophageal reflux disease)    pepto    Gout    Headache, unspecified 02/25/2015   Hiatal hernia    Hyperlipidemia    Hypertension    Hypothyroidism    Pneumonia    PVD (peripheral vascular disease) ([place])    has plastic aorta   Renal insufficiency    Seasonal allergies    Sleep apnea    does not wear c-pap    Past Surgical History:   Procedure Laterality Date   ANGIOPLASTY Right 01/26/2021   Procedure: ANGIOPLASTY RIGHT SUBCLAVIAN VEIN;  Surgeon: Serafina Mitchell, MD;  Location: [place];  Service: Vascular;  Laterality: Right;   aortobifemoral bypass     AV FISTULA PLACEMENT Left 12/01/2013   Procedure: ARTERIOVENOUS (AV) FISTULA CREATION- LEFT BRACHIOCEPHALIC;  Surgeon: Angelia Mould, MD;  Location: [place];  Service: Vascular;  Laterality: Left;   [place] Right 07/27/2014   Procedure: BASCILIC VEIN TRANSPOSITION;  Surgeon: Angelia Mould, MD;  Location: [place];  Service: Vascular;  Laterality: Right;   BRAIN SURGERY  07/22/2018   BREAST SURGERY     left - granulomatous mastitis   BURR HOLE Bilateral 07/22/2018   Procedure: BILATERAL BURR HOLES;  Surgeon: Kristeen Miss, MD;  Location: [place];  Service: Neurosurgery;  Laterality: Bilateral;   COLONOSCOPY     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 ([place] [facility]X)  1992   ENTEROSCOPY N/A 06/03/2018   Procedure: ENTEROSCOPY;  Surgeon: Lavena Bullion, DO;  Location: [facility];  Service: Gastroenterology;  Laterality: N/A;   ENTEROSCOPY N/A 11/20/2018   Procedure: ENTEROSCOPY;  Surgeon: Rush Landmark Telford Nab., MD;  Location: [place];  Service: Gastroenterology;  Laterality: N/A;   ENTEROSCOPY N/A 12/03/2019   Procedure: ENTEROSCOPY;  Surgeon: Lavena Bullion, DO;  Location: [facility];  Service: Gastroenterology;  Laterality: N/A;  push enteroscopy   EYE SURGERY Bilateral    cataracts   FISTULOGRAM Right 01/26/2021   Procedure: FISTULOGRAM RIGHT;  Surgeon: Serafina Mitchell, MD;  [facility];  Service: Vascular;  Laterality: Right;   HEMODIALYSIS INPATIENT  01/17/2018       HOT HEMOSTASIS N/A 06/03/2018   Procedure: HOT HEMOSTASIS (ARGON PLASMA COAGULATION/BICAP);  Surgeon: Lavena Bullion, DO;  Location[facility] ENDOSCOPY;  Service: Gastroenterology;  Laterality: N/A;   HOT HEMOSTASIS N/A 11/20/2018   Procedure: HOT HEMOSTASIS (ARGON PLASMA  COAGULATION/BICAP);  Surgeon: Irving Copas., MD;  Location: [place];  Service: Gastroenterology;  Laterality: N/A;   HOT HEMOSTASIS N/A 12/03/2019   Procedure: HOT HEMOSTASIS (ARGON PLASMA COAGULATION/BICAP);  Surgeon: Lavena Bullion, DO;  Location: [facility];  Service: Gastroenterology;  Laterality: N/A;   INTRAMEDULLARY (IM) NAIL INTERTROCHANTERIC Right 11/08/2020   Procedure: INTRAMEDULLARY (IM) NAIL INTERTROCHANTRIC;  Surgeon: Erle Crocker, MD;  Location: [place];  Service: Orthopedics;  Laterality: Right;   REVISION OF ARTERIOVENOUS GORETEX GRAFT Right 09/01/2019   Procedure: REVISION OF ARTERIOVENOUS FISTULA RIGHT ARM;  Surgeon: Serafina Mitchell, MD;  Location[facility];  Service: Vascular;  Laterality: Right;   REVISON OF ARTERIOVENOUS FISTULA Left 02/09/2014   Procedure: REVISON OF LEFT  ARTERIOVENOUS FISTULA - RESECTION OF RENDUNDANT VEIN;  Surgeon: Angelia Mould, MD;  Location: [place];  Service: Vascular;  Laterality: Left;   REVISON OF ARTERIOVENOUS FISTULA Right 01/26/2021   Procedure: REVISON OF ARTERIOVENOUS FISTULA RIGHT;  Surgeon: Serafina Mitchell, MD;  Location: [place] OR;  Service: Vascular;  Laterality: Right;   SBO with lysis adhesions     SHUNTOGRAM Left 04/19/2014   Procedure: FISTULOGRAM;  Surgeon: Angelia Mould, MD;  Location: [facility] CATH LAB;  Service: Cardiovascular;  Laterality: Left;   UNILATERAL UPPER EXTREMEITY ANGIOGRAM N/A 07/12/2014   Procedure: UNILATERAL UPPER Anselmo Rod;  Surgeon: Angelia Mould, MD;  Location: [facility] CATH LAB;  Service: Cardiovascular;  Laterality: N/A;    Prior to Admission medications   Medication Sig Start Date End Date Taking? Authorizing Provider  acetaminophen (TYLENOL) 325 MG tablet Take 1-2 tablets (325-650 mg total) by mouth every 4 (four) hours as needed for mild pain. 08/01/18  Yes Love, Ivan Anchors, PA-C  albuterol (VENTOLIN HFA) 108 (90 Base) MCG/ACT inhaler Inhale 1 puff into the lungs daily as  needed for wheezing or shortness of breath.   Yes [provider]  allopurinol (ZYLOPRIM) 100 MG tablet Take 100 mg by mouth daily.   Yes [provider]  camphor-menthol Timoteo Ace) lotion Apply 1 application topically 4 (four) times daily as needed for itching.    Yes [provider]  Carboxymethylcellulose Sodium (THERATEARS) 0.25 % SOLN Place 1 drop into both eyes as needed (for dryness).   Yes [provider]  cetirizine (ZYRTEC) 10 MG chewable tablet Chew 10 mg by mouth every Monday, Wednesday, and Friday.   Yes [provider]  clobetasol (TEMOVATE) 0.05 % external solution Apply 1 application topically 2 (two) times daily as needed (scalp irritation.).  04/19/15  Yes [provider]  donepezil (ARICEPT) 10 MG tablet Take 1 tablet (10 mg total) by mouth at bedtime. 07/28/20  Yes Melvenia Beam, MD  FLUOCINOLONE ACETONIDE SCALP 0.01 % OIL Apply 1 application topically daily as needed (scalp irritation.).  04/28/15  Yes [provider]  fluticasone (FLONASE) 50 MCG/ACT nasal spray Place 1 spray into both nostrils daily. 10/08/14  Yes Mikhail, Velta Addison, DO  hydrocortisone 2.5 % cream Apply 1 application topically 3 (three) times daily as needed (skin irritation (legs & arms)).  01/13/19  Yes [provider]  insulin aspart (NOVOLOG FLEXPEN) 100 UNIT/ML FlexPen INJECT 6-8 UNITS UNDER THE SKIN THREE TIMES DAILY BEFORE MEALS. DX:E11.65 Patient taking differently: Inject 6-8 Units into the skin See admin instructions. Inject 6-8 units under the skin three times daily before meals as needed for diabetes. 11/25/18  Yes Elayne Snare, MD  ketoconazole (NIZORAL) 2 % shampoo Apply 1 application topically 2 (two) times a week. Patient taking differently: Apply 1 application topically 2 (two) times a week. Sundays, and any other day 03/31/15  Yes Baxley, Cresenciano Lick, MD  levothyroxine (SYNTHROID, LEVOTHROID) 50 MCG tablet Take 1 tablet (50 mcg total) by  mouth daily. Patient taking differently: Take 50 mcg by mouth daily before breakfast. 08/06/14  Yes Baxley, Cresenciano Lick, MD  lidocaine (LIDODERM) 5 % Place 1 patch onto the skin daily as needed (back pain).  11/12/19  Yes [provider]  memantine (NAMENDA) 10 MG tablet Take 1 tablet (10 mg total) by mouth 2 (two) times daily. 07/28/20  Yes Melvenia Beam, MD  midodrine (PROAMATINE) 10 MG tablet Take one pill prior to hemodialysis on MWF Patient taking differently: Take 5  mg by mouth See admin instructions. Take one tablet by mouth prior to hemodialysis on MWF 08/13/18  Yes Love, Ivan Anchors, PA-C  Nutritional Supplements (FEEDING SUPPLEMENT, NEPRO CARB STEADY,) LIQD Take 237 mLs by mouth in the morning and at bedtime.    Yes [provider]  omeprazole (PRILOSEC) 40 MG capsule Take 1 capsule (40 mg total) by mouth daily. 03/13/18  Yes Esterwood, Amy S, PA-C  sevelamer carbonate (RENVELA) 800 MG tablet Take 1,600-2,400 mg by mouth See admin instructions. Take 2,400 mg by mouth three times a day with meals and 1,600 mg with snacks on Sun/Tues/Thurs/Sat and 2,400 mg with breakfast and dinner and nothing with snacks on Mon/Wed/Fri   Yes [provider]  simvastatin (ZOCOR) 20 MG tablet Take 20 mg by mouth at bedtime.   Yes [provider]  Teriparatide, Recombinant, (FORTEO) 600 MCG/2.4ML SOPN Inject 20 mcg subcutaneously daily 03/25/21  Yes Elayne Snare, MD  TRADJENTA 5 MG TABS tablet TAKE 1 TABLET DAILY Patient taking differently: Take 5 mg by mouth daily. 03/01/20  Yes Elayne Snare, MD  Blood Glucose Monitoring Suppl (FREESTYLE FREEDOM LITE) w/Device KIT Use to check blood sugar 2 times per day dx code E11.65 10/21/18   Elayne Snare, MD  cyanocobalamin (,VITAMIN B-12,) 1000 MCG/ML injection Inject 1 mL (1,000 mcg total) into the muscle every 30 (thirty) days. 05/18/21   Elby Showers, MD  doxycycline (VIBRAMYCIN) 100 MG capsule Take 1 capsule (100 mg total) by mouth 2 (two) times  daily. Patient not taking: No sig reported 04/11/21   Petrucelli, Samantha R, PA-C  glucose blood (FREESTYLE LITE) test strip USE AS DIRECTED THREE TIMES DAILY 01/16/21   Elayne Snare, MD  HYDROcodone-acetaminophen (NORCO/VICODIN) 5-325 MG tablet Take 1 tablet by mouth every 4 (four) hours as needed for moderate pain. Patient not taking: No sig reported 01/26/21 01/26/22  Setzer, Edman Circle, PA-C  Insulin Pen Needle (PEN NEEDLES) 32G X 4 MM MISC 1 each by Does not apply route 3 (three) times daily. USE PEN NEEDLES TO INJECT INSULIN THREE TIMES DAILY. 11/25/18   Elayne Snare, MD  Lancets (FREESTYLE) lancets Use as instructed to check blood sugar 2 times per day dx code E11.65 02/22/16   Elayne Snare, MD    Scheduled Meds:  allopurinol  100 mg Oral Daily   Chlorhexidine Gluconate Cloth  6 each Topical Daily   donepezil  10 mg Oral QHS   feeding supplement (NEPRO CARB STEADY)  237 mL Oral TID BM   fluticasone  1 spray Each Nare Daily   levothyroxine  50 mcg Oral Q0600   memantine  10 mg Oral BID   [START ON 07/05/2021] midodrine  5 mg Oral Q M,W,F-HD   pantoprazole (PROTONIX) IV  40 mg Intravenous Q12H   sevelamer carbonate  2,400 mg Oral TID WC   simvastatin  20 mg Oral QHS   Infusions:  PRN Meds: acetaminophen **OR** acetaminophen, albuterol, ondansetron **OR** ondansetron (ZOFRAN) IV, sevelamer carbonate   Allergies as of 07/03/2021 - Review Complete 07/03/2021  Allergen Reaction Noted   Ambien [zolpidem tartrate] Other (See Comments) 11/21/2018   Penicillins Rash and Hives 12/27/2000    Family History  Problem Relation Age of Onset   Aneurysm Mother        brain   Heart disease Father    Stroke Father    Hypertension Father    Diabetes Father    Dementia Neg Hx    Colon cancer Neg Hx  Esophageal cancer Neg Hx    Pancreatic cancer Neg Hx    Prostate cancer Neg Hx    Rectal cancer Neg Hx    Stomach cancer Neg Hx     Social History   Socioeconomic History   Marital status:  Married    Spouse name: [name] Mood   Number of children: 1   Years of education: 16   Highest education level: Not on file  Occupational History   Occupation: Retired  Tobacco Use   Smoking status: Former    Types: Cigarettes    Quit date: 09/24/1994    Years since quitting: 26.7   Smokeless tobacco: Never  Vaping Use   Vaping Use: Never used  Substance and Sexual Activity   Alcohol use: No    Alcohol/week: 0.0 standard drinks    Comment: Quit Oct. 1977 ("somewhat heavy")   Drug use: No   Sexual activity: Not on file  Other Topics Concern   Not on file  Social History Narrative   Lives at home with wife.   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 ENT:  No nose bleeds Pulm: Denies shortness of breath, denies cough. CV:  No palpitations, no LE edema.  Denies chest pressure/pain. GU:  No hematuria, no frequency GI: No difficulty swallowing.  No nausea. Heme: No excessive bleeding but there is bruising at the right upper extremity where he has recently undergone some sort of dialysis access procedure. Transfusions: Per HPI Neuro:  No headaches, no peripheral tingling or numbness Derm:  No itching, no rash or sores.  Endocrine:  No sweats or chills.  No polyuria or dysuria Immunization: Reviewed. Travel:  None    PHYSICAL EXAM: Vital signs in last 24 hours: Vitals:   07/04/21  0400 07/04/21 0700  BP: (!) 163/71 (!) 164/47  Pulse: 69 74  Resp: 13 12  Temp:    SpO2: 99% 100%   Wt Readings from Last 3 Encounters:  05/18/21 65.3 kg  05/16/21 66.7 kg  05/11/21 66 kg    General: Morbidly obese, looks chronically ill but comfortable on stretcher in ED. Head: No facial asymmetry or swelling.  No signs of head trauma. Eyes: Conjunctiva pink.  No scleral icterus. Ears: Slightly hard of hearing Nose: No congestion or discharge Mouth: Upper denture in place.  Mucosa is moist, pink, clear.  Tongue midline. Neck: No mass, no JVD Lungs: Clear bilaterally though diminished at the bases.  No labored breathing or cough. Heart: RRR.  No MRG.  S1, S2 present. Abdomen: Soft, obese.  Not tender.  No HSM, masses, bruits, hernias.  Midline surgical scar is well-healed but significant..   Rectal: Deferred Musc/Skeltl: No joint redness, swelling or gross deformity. Extremities: No CCE.  Feet are warm. Neurologic: Alert.  Follows commands.  Appropriate.  Moves all 4 limbs without tremor or  gross weakness.  Not oriented to month, year.  Cannot tell me his address but knows he lives in [place].  Knows he is at the hospital. Skin: No rash, no sores, no telangiectasia.  Bruising evident at right upper extremity where there is an intact surgical scar. Nodes: No cervical adenopathy Psych: Cooperative, calm, pleasant.  Follows commands.  Intake/Output from previous day: No intake/output data recorded. Intake/Output this shift: No intake/output data recorded.  LAB RESULTS: Recent Labs    07/03/21 1228 07/03/21 1835 07/04/21 0432  WBC 8.7 8.4 6.1  HGB 9.1* 9.3* 8.2*  HCT 29.6* 29.3* 27.3*  PLT 203 215 182   BMET Lab Results  Component Value Date   NA 136 07/04/2021   NA 140 07/03/2021   NA 144 05/16/2021   K 4.7 07/04/2021   K 4.4 07/03/2021   K 4.2 05/16/2021   CL 100 07/04/2021   CL 102 07/03/2021   CL 102 05/16/2021   CO2 20 (L) 07/04/2021   CO2 25  07/03/2021   CO2 29 05/16/2021   GLUCOSE 70 07/04/2021   GLUCOSE 110 (H) 07/03/2021   GLUCOSE 80 05/16/2021   BUN 46 (H) 07/04/2021   BUN 40 (H) 07/03/2021   BUN 22 05/16/2021   CREATININE 9.24 (H) 07/04/2021   CREATININE 8.66 (H) 07/03/2021   CREATININE 5.46 (H) 05/16/2021   CALCIUM 8.7 (L) 07/04/2021   CALCIUM 9.1 07/03/2021   CALCIUM 9.7 05/16/2021   LFT Recent Labs    07/03/21 1228 07/04/21 0432  PROT 5.8* 5.4*  ALBUMIN 2.9* 2.5*  AST 27 27  ALT 6 8  ALKPHOS 218* 210*  BILITOT 0.4 0.8   PT/INR Lab Results  Component Value Date   INR 1.0 01/12/2021   INR 0.97 07/22/2018   INR 1.01 06/05/2016   Hepatitis Panel No results for input(s): HEPBSAG, HCVAB, HEPAIGM, HEPBIGM in the last 72 hours. C-Diff No components found for: CDIFF Lipase     Component Value Date/Time   LIPASE 37 07/03/2021 1228    Drugs of Abuse  No results found for: LABOPIA, COCAINSCRNUR, LABBENZ, AMPHETMU, THCU, LABBARB   RADIOLOGY STUDIES: CT ABDOMEN PELVIS WO CONTRAST  Result Date: 07/03/2021 CLINICAL DATA:  Abdominal pain, left lower quadrant pain. EXAM: CT ABDOMEN AND PELVIS WITHOUT CONTRAST TECHNIQUE: Multidetector CT imaging of the abdomen and pelvis was performed following the standard protocol without IV contrast. COMPARISON:  07/17/2020 FINDINGS: Lower chest: New small bilateral pleural effusions. No confluent opacities. Hepatobiliary: No focal hepatic abnormality. Gallbladder unremarkable. Pancreas: No focal abnormality or ductal dilatation. Spleen: No focal abnormality.  Normal size. Adrenals/Urinary Tract: Kidneys are atrophic with cortical thinning and numerous bilateral renal cysts. No hydronephrosis. No renal or ureteral stones. Adrenal glands and urinary bladder unremarkable. Stomach/Bowel: Sigmoid diverticulosis. No active diverticulitis. Stomach and small bowel decompressed, unremarkable. Vascular/Lymphatic: Heavily calcified aorta. Status post aortobifemoral bypass. No evidence of  aneurysm or adenopathy. Reproductive: Central calcifications within the prostate. Other: No free fluid or free air. Musculoskeletal: No acute bony abnormality. IMPRESSION: Small bilateral pleural effusions. Few scattered sigmoid diverticula.  No active diverticulitis. Renal cortical thinning and atrophy with numerous renal cysts. No hydronephrosis. Prior aorto bi femoral bypass. No acute findings. Electronically Signed   By: Rolm Baptise M.D.   On: 07/03/2021 18:15   DG Chest 2 View  Result Date: 07/03/2021 CLINICAL DATA:  Chest pain. EXAM: CHEST - 2 VIEW COMPARISON:  January 12, 2021. FINDINGS: Mild enlargement the cardiac silhouette. Right IJ dual lumen dialysis catheter with the tip  projecting at the right atrium. Calcific atherosclerosis of the aorta. No consolidation. Small bilateral pleural effusions. Mild diffuse interstitial prominence. IMPRESSION: Small bilateral effusions. Mild diffuse interstitial prominence, which may represent mild interstitial edema versus the sequela of recurrent bouts of congestive heart failure. Electronically Signed   By: Margaretha Sheffield M.D.   On: 07/03/2021 13:07      IMPRESSION:      Recurrent GI bleeding manifesting as melena with associated drop in Hb.  Suspect AVMs are the source as he has had several AVMs, primarily in the upper GI tract, treated with APC in the past.  ESRD.  Did not undergo dialysis yesterday.  I called dialysis unit and he is not yet on the schedule for dialysis today.  Does not display symptoms or physical findings of volume overload.  Macrocytic anemia.  Folate level normal.  B12 level normal in 04/2021    PLAN:        SBE today.     Note pt is enrolled w outpt palliative care service and is DNR.     Azucena Freed  07/04/2021, 9:31 AM Phone [phone]

## 2021-07-04 NOTE — H&P (Addendum)
History and Physical    TREMAR WICKENS FAO:[id number] DOB: Mar 01, 1941 DOA: 07/03/2021  PCP: Elby Showers, MD  Patient coming from: Home   Chief Complaint:  Chief Complaint  Patient presents with   Abdominal Pain   Diarrhea   Chest Pain     HPI:    80 year old male with past medical history of end-stage renal disease (HD MWF), diabetes mellitus type 2, anemia of chronic disease, hyperlipidemia, hypertension, benign prostatic hyperplasia, obstructive sleep apnea, gastroesophageal reflux disease, dementia, B12 deficiency who presents to [facility] emergency department with complaints of abdominal pain and melena.  History is been obtained both from the patient as well as the wife via phone conversation.  Patient explains that he began to experience abdominal pain proxy 1 or 2 weeks ago.  The abdominal pain as moderate to severe in intensity, located in the lower abdomen.  Pain wax and wanes and radiates diffusely.  In the past 2 to 3 days in particular patient has begun to exhibit bouts of melena, several times daily.  Upon further questioning, patient and spouse both deny that the patient takes any NSAIDs or other blood thinners.  Patient does not drink alcohol.  Patient does not take iron supplements.  Patient's symptoms continue to persist until he eventually presented to [facility] emergency department for evaluation.  Upon evaluation in the emergency department patient underwent CT imaging of the abdomen and pelvis hemoglobin has been identified to be 9.3, down from 10.5 one month ago.  CT imaging the abdomen pelvis was performed revealing no significant acute abnormality.  Due to ongoing complaints of melena and abdominal tenderness, ER provider discussed case with gastroenterology via secure chat who agreed to see the patient in consultation the following morning.  Patient was initiated on intravenous Protonix and the hospitalist group was then called to assess the  patient for admission to the hospital.  Hypothyroidism,  Review of Systems:   Review of Systems  Gastrointestinal:  Positive for abdominal pain, melena and nausea.  All other systems reviewed and are negative.  Past Medical History:  Diagnosis Date   Allergy    Alzheimer's disease ([place]) 01/19/2021   Anemia    Arthritis    Cataract    bil cateracts removed   Coronary artery disease    Dementia arising in the senium and presenium ([place] )    Diabetes mellitus    Type 2   Diverticulitis    ED (erectile dysfunction)    Elevated homocysteine    ESRD (end stage renal disease) on dialysis ([place]) 03/2015   M-W-F dialysis   GERD (gastroesophageal reflux disease)    pepto    Gout    Headache, unspecified 02/25/2015   Hiatal hernia    Hyperlipidemia    Hypertension    Hypothyroidism    Pneumonia    PVD (peripheral vascular disease) ([place])    has plastic aorta   Renal insufficiency    Seasonal allergies    Sleep apnea    does not wear c-pap    Past Surgical History:  Procedure Laterality Date   ANGIOPLASTY Right 01/26/2021   Procedure: ANGIOPLASTY RIGHT SUBCLAVIAN VEIN;  Surgeon: Serafina Mitchell, MD;  Location: [facility] OR;  Service: Vascular;  Laterality: Right;   aortobifemoral bypass     AV FISTULA PLACEMENT Left 12/01/2013   Procedure: ARTERIOVENOUS (AV) FISTULA CREATION- LEFT BRACHIOCEPHALIC;  Surgeon: Angelia Mould, MD;  Location: [place];  Service: Vascular;  Laterality: Left;   BASCILIC  VEIN TRANSPOSITION Right 07/27/2014   Procedure: BASCILIC VEIN TRANSPOSITION;  Surgeon: Angelia Mould, MD;  Location: [place];  Service: Vascular;  Laterality: Right;   BRAIN SURGERY  07/22/2018   BREAST SURGERY     left - granulomatous mastitis   BURR HOLE Bilateral 07/22/2018   Procedure: BILATERAL BURR HOLES;  Surgeon: Kristeen Miss, MD;  Location: [place];  Service: Neurosurgery;  Laterality: Bilateral;   COLONOSCOPY     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 ([place] HX)  1992   ENTEROSCOPY  N/A 06/03/2018   Procedure: ENTEROSCOPY;  Surgeon: Lavena Bullion, DO;  Location: [facility] ENDOSCOPY;  Service: Gastroenterology;  Laterality: N/A;   ENTEROSCOPY N/A 11/20/2018   Procedure: ENTEROSCOPY;  Surgeon: Rush Landmark Telford Nab., MD;  Location: [place];  Service: Gastroenterology;  Laterality: N/A;   ENTEROSCOPY N/A 12/03/2019   Procedure: ENTEROSCOPY;  Surgeon: Lavena Bullion, DO;  Location: [facility] ENDOSCOPY;  Service: Gastroenterology;  Laterality: N/A;  push enteroscopy   EYE SURGERY Bilateral    cataracts   FISTULOGRAM Right 01/26/2021   Procedure: FISTULOGRAM RIGHT;  Surgeon: Serafina Mitchell, MD;  Location: [facility] OR;  Service: Vascular;  Laterality: Right;   HEMODIALYSIS INPATIENT  01/17/2018       HOT HEMOSTASIS N/A 06/03/2018   Procedure: HOT HEMOSTASIS (ARGON PLASMA COAGULATION/BICAP);  Surgeon: Lavena Bullion, DO;  Location: [facility] ENDOSCOPY;  Service: Gastroenterology;  Laterality: N/A;   HOT HEMOSTASIS N/A 11/20/2018   Procedure: HOT HEMOSTASIS (ARGON PLASMA COAGULATION/BICAP);  Surgeon: Irving Copas., MD;  Location: [place];  Service: Gastroenterology;  Laterality: N/A;   HOT HEMOSTASIS N/A 12/03/2019   Procedure: HOT HEMOSTASIS (ARGON PLASMA COAGULATION/BICAP);  Surgeon: Lavena Bullion, DO;  [facility] ENDOSCOPY;  Service: Gastroenterology;  Laterality: N/A;   INTRAMEDULLARY (IM) NAIL INTERTROCHANTERIC Right 11/08/2020   Procedure: INTRAMEDULLARY (IM) NAIL INTERTROCHANTRIC;  Surgeon: Erle Crocker, MD;  Location: [place];  Service: Orthopedics;  Laterality: Right;   REVISION OF ARTERIOVENOUS GORETEX GRAFT Right 09/01/2019   Procedure: REVISION OF ARTERIOVENOUS FISTULA RIGHT ARM;  Surgeon: Serafina Mitchell, MD;  Location: [place];  Service: Vascular;  Laterality: Right;   REVISON OF ARTERIOVENOUS FISTULA Left 02/09/2014   Procedure: REVISON OF LEFT ARTERIOVENOUS FISTULA - RESECTION OF RENDUNDANT VEIN;  Surgeon: Angelia Mould, MD;  Location: [place];   Service: Vascular;  Laterality: Left;   REVISON OF ARTERIOVENOUS FISTULA Right 01/26/2021   Procedure: REVISON OF ARTERIOVENOUS FISTULA RIGHT;  Surgeon: Serafina Mitchell, MD;  Location: [place];  Service: Vascular;  Laterality: Right;   SBO with lysis adhesions     SHUNTOGRAM Left 04/19/2014   Procedure: FISTULOGRAM;  Surgeon: Angelia Mould, MD;  Location: [facility] CATH LAB;  Service: Cardiovascular;  Laterality: Left;   UNILATERAL UPPER EXTREMEITY ANGIOGRAM N/A 07/12/2014   Procedure: UNILATERAL UPPER Anselmo Rod;  Surgeon: Angelia Mould, MD;  Location: [facility] CATH LAB;  Service: Cardiovascular;  Laterality: N/A;     reports that he quit smoking about 26 years ago. His smoking use included cigarettes. He has never used smokeless tobacco. He reports that he does not drink alcohol and does not use drugs.  Allergies  Allergen Reactions   Ambien [Zolpidem Tartrate] Other (See Comments)    Hallucinations and "felt crazy"    Penicillins Rash and Hives    Has patient had a PCN reaction causing immediate rash, facial/tongue/throat swelling, SOB or lightheadedness with hypotension: Yes Has patient had a PCN reaction causing severe rash involving mucus membranes  or skin necrosis: Yes Has patient had a PCN reaction that required hospitalization: No Has patient had a PCN reaction occurring within the last 10 years: No If all of the above answers are "NO", then may proceed with Cephalosporin use.  Other reaction(s): HIVES    Family History  Problem Relation Age of Onset   Aneurysm Mother        brain   Heart disease Father    Stroke Father    Hypertension Father    Diabetes Father    Dementia Neg Hx    Colon cancer Neg Hx    Esophageal cancer Neg Hx    Pancreatic cancer Neg Hx    Prostate cancer Neg Hx    Rectal cancer Neg Hx    Stomach cancer Neg Hx      Prior to Admission medications   Medication Sig Start Date End Date Taking? Authorizing Provider  acetaminophen  (TYLENOL) 325 MG tablet Take 1-2 tablets (325-650 mg total) by mouth every 4 (four) hours as needed for mild pain. 08/01/18  Yes Love, Ivan Anchors, PA-C  albuterol (VENTOLIN HFA) 108 (90 Base) MCG/ACT inhaler Inhale 1 puff into the lungs daily as needed for wheezing or shortness of breath.   Yes [provider]  allopurinol (ZYLOPRIM) 100 MG tablet Take 100 mg by mouth daily.   Yes [provider]  camphor-menthol Timoteo Ace) lotion Apply 1 application topically 4 (four) times daily as needed for itching.    Yes [provider]  Carboxymethylcellulose Sodium (THERATEARS) 0.25 % SOLN Place 1 drop into both eyes as needed (for dryness).   Yes [provider]  cetirizine (ZYRTEC) 10 MG chewable tablet Chew 10 mg by mouth every Monday, Wednesday, and Friday.   Yes [provider]  clobetasol (TEMOVATE) 0.05 % external solution Apply 1 application topically 2 (two) times daily as needed (scalp irritation.).  04/19/15  Yes [provider]  donepezil (ARICEPT) 10 MG tablet Take 1 tablet (10 mg total) by mouth at bedtime. 07/28/20  Yes Melvenia Beam, MD  FLUOCINOLONE ACETONIDE SCALP 0.01 % OIL Apply 1 application topically daily as needed (scalp irritation.).  04/28/15  Yes [provider]  fluticasone (FLONASE) 50 MCG/ACT nasal spray Place 1 spray into both nostrils daily. 10/08/14  Yes Mikhail, Velta Addison, DO  hydrocortisone 2.5 % cream Apply 1 application topically 3 (three) times daily as needed (skin irritation (legs & arms)).  01/13/19  Yes [provider]  insulin aspart (NOVOLOG FLEXPEN) 100 UNIT/ML FlexPen INJECT 6-8 UNITS UNDER THE SKIN THREE TIMES DAILY BEFORE MEALS. DX:E11.65 Patient taking differently: Inject 6-8 Units into the skin See admin instructions. Inject 6-8 units under the skin three times daily before meals as needed for diabetes. 11/25/18  Yes Elayne Snare, MD  ketoconazole (NIZORAL) 2 % shampoo Apply 1 application topically 2 (two)  times a week. Patient taking differently: Apply 1 application topically 2 (two) times a week. Sundays, and any other day 03/31/15  Yes Baxley, Cresenciano Lick, MD  levothyroxine (SYNTHROID, LEVOTHROID) 50 MCG tablet Take 1 tablet (50 mcg total) by mouth daily. Patient taking differently: Take 50 mcg by mouth daily before breakfast. 08/06/14  Yes Baxley, Cresenciano Lick, MD  lidocaine (LIDODERM) 5 % Place 1 patch onto the skin daily as needed (back pain).  11/12/19  Yes [provider]  memantine (NAMENDA) 10 MG tablet Take 1 tablet (10 mg total) by mouth 2 (two) times daily. 07/28/20  Yes Melvenia Beam, MD  midodrine (  PROAMATINE) 10 MG tablet Take one pill prior to hemodialysis on MWF Patient taking differently: Take 5 mg by mouth See admin instructions. Take one tablet by mouth prior to hemodialysis on MWF 08/13/18  Yes Love, Ivan Anchors, PA-C  Nutritional Supplements (FEEDING SUPPLEMENT, NEPRO CARB STEADY,) LIQD Take 237 mLs by mouth in the morning and at bedtime.    Yes [provider]  omeprazole (PRILOSEC) 40 MG capsule Take 1 capsule (40 mg total) by mouth daily. 03/13/18  Yes Esterwood, Amy S, PA-C  sevelamer carbonate (RENVELA) 800 MG tablet Take 1,600-2,400 mg by mouth See admin instructions. Take 2,400 mg by mouth three times a day with meals and 1,600 mg with snacks on Sun/Tues/Thurs/Sat and 2,400 mg with breakfast and dinner and nothing with snacks on Mon/Wed/Fri   Yes [provider]  simvastatin (ZOCOR) 20 MG tablet Take 20 mg by mouth at bedtime.   Yes [provider]  Teriparatide, Recombinant, (FORTEO) 600 MCG/2.4ML SOPN Inject 20 mcg subcutaneously daily 03/25/21  Yes Elayne Snare, MD  TRADJENTA 5 MG TABS tablet TAKE 1 TABLET DAILY Patient taking differently: Take 5 mg by mouth daily. 03/01/20  Yes Elayne Snare, MD  Blood Glucose Monitoring Suppl (FREESTYLE FREEDOM LITE) w/Device KIT Use to check blood sugar 2 times per day dx code E11.65 10/21/18   Elayne Snare, MD   cyanocobalamin (,VITAMIN B-12,) 1000 MCG/ML injection Inject 1 mL (1,000 mcg total) into the muscle every 30 (thirty) days. 05/18/21   Elby Showers, MD  doxycycline (VIBRAMYCIN) 100 MG capsule Take 1 capsule (100 mg total) by mouth 2 (two) times daily. Patient not taking: No sig reported 04/11/21   Petrucelli, Samantha R, PA-C  glucose blood (FREESTYLE LITE) test strip USE AS DIRECTED THREE TIMES DAILY 01/16/21   Elayne Snare, MD  HYDROcodone-acetaminophen (NORCO/VICODIN) 5-325 MG tablet Take 1 tablet by mouth every 4 (four) hours as needed for moderate pain. Patient not taking: No sig reported 01/26/21 01/26/22  Setzer, Edman Circle, PA-C  Insulin Pen Needle (PEN NEEDLES) 32G X 4 MM MISC 1 each by Does not apply route 3 (three) times daily. USE PEN NEEDLES TO INJECT INSULIN THREE TIMES DAILY. 11/25/18   Elayne Snare, MD  Lancets (FREESTYLE) lancets Use as instructed to check blood sugar 2 times per day dx code E11.65 02/22/16   Elayne Snare, MD    Physical Exam: Vitals:   07/03/21 1135 07/03/21 1209 07/03/21 1512 07/04/21 0023  BP:  (!) 158/49 (!) 173/68 (!) 162/73  Pulse:  72 77 73  Resp:  17 18 17   Temp:  98.2 F (36.8 C)    SpO2: 99% 99% 100% 98%    Constitutional: Awake alert and oriented x3, no associated distress.   Skin: no rashes, no lesions, poor skin turgor noted. Eyes: Pupils are equally reactive to light.  No evidence of scleral icterus or conjunctival pallor.  ENMT: Dry mucous membranes noted.  Posterior pharynx clear of any exudate or lesions.   Neck: normal, supple, no masses, no thyromegaly.  No evidence of jugular venous distension.   Respiratory: clear to auscultation bilaterally, no wheezing, no crackles. Normal respiratory effort. No accessory muscle use.  Cardiovascular: Regular rate and rhythm, no murmurs / rubs / gallops. No extremity edema. 2+ pedal pulses. No carotid bruits.  Chest:   Nontender without crepitus or deformity.   Back:   Nontender without crepitus or  deformity. Abdomen: Diffuse abdominal tenderness noted, worse in the bilateral lower quadrants.  Abdomen is soft however.  Abdomen is soft and nontender.  No evidence of intra-abdominal masses.  Positive bowel sounds noted in all quadrants.   Musculoskeletal: No joint deformity upper and lower extremities. Good ROM, no contractures. Normal muscle tone.  Neurologic: CN 2-12 grossly intact. Sensation intact.  Patient moving all 4 extremities spontaneously.  Patient is following all commands.  Patient is responsive to verbal stimuli.   Psychiatric: Patient exhibits normal mood with appropriate affect.  Patient seems to possess insight as to their current situation.     Labs on Admission: I have personally reviewed following labs and imaging studies -   CBC: Recent Labs  Lab 07/03/21 1228 07/03/21 1835  WBC 8.7 8.4  NEUTROABS 6.4 5.1  HGB 9.1* 9.3*  HCT 29.6* 29.3*  MCV 109.6* 108.1*  PLT 203 518   Basic Metabolic Panel: Recent Labs  Lab 07/03/21 1228  NA 140  K 4.4  CL 102  CO2 25  GLUCOSE 110*  BUN 40*  CREATININE 8.66*  CALCIUM 9.1   GFR: CrCl cannot be calculated (Unknown ideal weight.). Liver Function Tests: Recent Labs  Lab 07/03/21 1228  AST 27  ALT 6  ALKPHOS 218*  BILITOT 0.4  PROT 5.8*  ALBUMIN 2.9*   Recent Labs  Lab 07/03/21 1228  LIPASE 37   No results for input(s): AMMONIA in the last 168 hours. Coagulation Profile: No results for input(s): INR, PROTIME in the last 168 hours. Cardiac Enzymes: No results for input(s): CKTOTAL, CKMB, CKMBINDEX, TROPONINI in the last 168 hours. BNP (last 3 results) No results for input(s): PROBNP in the last 8760 hours. HbA1C: No results for input(s): HGBA1C in the last 72 hours. CBG: No results for input(s): GLUCAP in the last 168 hours. Lipid Profile: No results for input(s): CHOL, HDL, LDLCALC, TRIG, CHOLHDL, LDLDIRECT in the last 72 hours. Thyroid Function Tests: No results for input(s): TSH, T4TOTAL,  FREET4, T3FREE, THYROIDAB in the last 72 hours. Anemia Panel: No results for input(s): VITAMINB12, FOLATE, FERRITIN, TIBC, IRON, RETICCTPCT in the last 72 hours. Urine analysis:    Component Value Date/Time   COLORURINE YELLOW 07/21/2018 1044   APPEARANCEUR CLOUDY (A) 07/21/2018 1044   LABSPEC 1.009 07/21/2018 1044   PHURINE 9.0 (H) 07/21/2018 1044   GLUCOSEU NEGATIVE 07/21/2018 1044   HGBUR NEGATIVE 07/21/2018 1044   BILIRUBINUR NEGATIVE 07/21/2018 1044   BILIRUBINUR neg 02/14/2015 1115   KETONESUR NEGATIVE 07/21/2018 1044   PROTEINUR 100 (A) 07/21/2018 1044   UROBILINOGEN 0.2 02/20/2015 0944   NITRITE NEGATIVE 07/21/2018 1044   LEUKOCYTESUR SMALL (A) 07/21/2018 1044    Radiological Exams on Admission - Personally Reviewed: CT ABDOMEN PELVIS WO CONTRAST  Result Date: 07/03/2021 CLINICAL DATA:  Abdominal pain, left lower quadrant pain. EXAM: CT ABDOMEN AND PELVIS WITHOUT CONTRAST TECHNIQUE: Multidetector CT imaging of the abdomen and pelvis was performed following the standard protocol without IV contrast. COMPARISON:  07/17/2020 FINDINGS: Lower chest: New small bilateral pleural effusions. No confluent opacities. Hepatobiliary: No focal hepatic abnormality. Gallbladder unremarkable. Pancreas: No focal abnormality or ductal dilatation. Spleen: No focal abnormality.  Normal size. Adrenals/Urinary Tract: Kidneys are atrophic with cortical thinning and numerous bilateral renal cysts. No hydronephrosis. No renal or ureteral stones. Adrenal glands and urinary bladder unremarkable. Stomach/Bowel: Sigmoid diverticulosis. No active diverticulitis. Stomach and small bowel decompressed, unremarkable. Vascular/Lymphatic: Heavily calcified aorta. Status post aortobifemoral bypass. No evidence of aneurysm or adenopathy. Reproductive: Central calcifications within the prostate. Other: No free fluid or free air. Musculoskeletal: No acute bony abnormality. IMPRESSION: Small bilateral pleural  effusions. Few  scattered sigmoid diverticula.  No active diverticulitis. Renal cortical thinning and atrophy with numerous renal cysts. No hydronephrosis. Prior aorto bi femoral bypass. No acute findings. Electronically Signed   By: Rolm Baptise M.D.   On: 07/03/2021 18:15   DG Chest 2 View  Result Date: 07/03/2021 CLINICAL DATA:  Chest pain. EXAM: CHEST - 2 VIEW COMPARISON:  January 12, 2021. FINDINGS: Mild enlargement the cardiac silhouette. Right IJ dual lumen dialysis catheter with the tip projecting at the right atrium. Calcific atherosclerosis of the aorta. No consolidation. Small bilateral pleural effusions. Mild diffuse interstitial prominence. IMPRESSION: Small bilateral effusions. Mild diffuse interstitial prominence, which may represent mild interstitial edema versus the sequela of recurrent bouts of congestive heart failure. Electronically Signed   By: Margaretha Sheffield M.D.   On: 07/03/2021 13:07    EKG: Personally reviewed.  Rhythm is normal sinus rhythm with heart rate of 69 bpm.  First-degree AV block.  No dynamic ST segment changes appreciated.  Assessment/Plan  * Melena Patient presenting with several days of abdominal pain and melena Notable drop in hemoglobin compared to previous CBC 1 month ago. Melena typically does suggest that a source of bleeding could be upper in location, this is not entirely clear at this time. Patient's been placed on Protonix IV twice daily Performing serial CBCs to monitor hemoglobin hematocrit Monitoring clinically for further bouts of bleeding ER provider has already reached out to gastroenterology via epic secure chat who graciously has agreed to be evaluated the patient and consultation in the morning for possible endoscopic evaluation. We will transfuse with packed red blood cells if hemoglobin drops below 7.   Elevated troponin level not due myocardial infarction Slightly elevated troponin  Serial troponins revealed a flat trajectory of elevation making  plaque rupture unlikely Patient currently chest pain-free Monitoring patient on telemetry  ESRD on dialysis [facility]) Patient typically receives hemodialysis on Monday Wednesday and Friday According to the wife the patient missed his hemodialysis session today Consulting nephrology in the morning for resumption of hemodialysis while hospitalized Monitoring renal function and electrolytes with serial chemistries Fluid restricted renal diet    Mixed diabetic hyperlipidemia associated with type 2 diabetes mellitus ([place]) Continuing home regimen of lipid lowering therapy.   Dementia without behavioral disturbance (HCC) Mild disease, patient is still able to provide a history and make his own medical decision Minimizing uncomfortable stimuli Minimizing use of sedating agent Fall precautions Nursing to provide frequent redirection for the patient Encouraging family to remain at bedside is much as possible   Hypothyroidism Resume home regimen of Synthroid    Essential hypertension As needed intravenous hydralazine for markedly elevated blood pressure.     Code Status:  DNR  code status decision has been confirmed with: patient and spouse Family Communication: Discussed plan of care with wife via phone conversation.  Status is: Observation  The patient remains OBS appropriate and will d/c before 2 midnights.  Dispo: The patient is from: Home              Anticipated d/c is to: Home              Patient currently is not medically stable to d/c.   Difficult to place patient No        Vernelle Emerald MD [facility] Pager [phone]  If 7PM-7AM, please contact night-coverage [email or web address] Use universal [place] password for that web site. If you do not have the password, please call the hospital  operator.  07/04/2021, 1:23 AM

## 2021-07-05 ENCOUNTER — Encounter (HOSPITAL_COMMUNITY): Payer: Self-pay | Admitting: Internal Medicine

## 2021-07-05 DIAGNOSIS — N186 End stage renal disease: Secondary | ICD-10-CM | POA: Diagnosis not present

## 2021-07-05 DIAGNOSIS — I499 Cardiac arrhythmia, unspecified: Secondary | ICD-10-CM | POA: Diagnosis not present

## 2021-07-05 DIAGNOSIS — Z992 Dependence on renal dialysis: Secondary | ICD-10-CM | POA: Diagnosis not present

## 2021-07-05 DIAGNOSIS — R778 Other specified abnormalities of plasma proteins: Secondary | ICD-10-CM | POA: Diagnosis not present

## 2021-07-05 DIAGNOSIS — E039 Hypothyroidism, unspecified: Secondary | ICD-10-CM | POA: Diagnosis not present

## 2021-07-05 DIAGNOSIS — D631 Anemia in chronic kidney disease: Secondary | ICD-10-CM | POA: Diagnosis not present

## 2021-07-05 DIAGNOSIS — K5521 Angiodysplasia of colon with hemorrhage: Secondary | ICD-10-CM | POA: Diagnosis not present

## 2021-07-05 DIAGNOSIS — K922 Gastrointestinal hemorrhage, unspecified: Secondary | ICD-10-CM | POA: Diagnosis not present

## 2021-07-05 DIAGNOSIS — I10 Essential (primary) hypertension: Secondary | ICD-10-CM | POA: Diagnosis not present

## 2021-07-05 DIAGNOSIS — I120 Hypertensive chronic kidney disease with stage 5 chronic kidney disease or end stage renal disease: Secondary | ICD-10-CM | POA: Diagnosis not present

## 2021-07-05 DIAGNOSIS — E8889 Other specified metabolic disorders: Secondary | ICD-10-CM | POA: Diagnosis not present

## 2021-07-05 DIAGNOSIS — E1169 Type 2 diabetes mellitus with other specified complication: Secondary | ICD-10-CM | POA: Diagnosis not present

## 2021-07-05 DIAGNOSIS — E1122 Type 2 diabetes mellitus with diabetic chronic kidney disease: Secondary | ICD-10-CM | POA: Diagnosis not present

## 2021-07-05 DIAGNOSIS — K31811 Angiodysplasia of stomach and duodenum with bleeding: Secondary | ICD-10-CM | POA: Diagnosis not present

## 2021-07-05 DIAGNOSIS — Z20822 Contact with and (suspected) exposure to covid-19: Secondary | ICD-10-CM | POA: Diagnosis not present

## 2021-07-05 DIAGNOSIS — K921 Melena: Secondary | ICD-10-CM | POA: Diagnosis not present

## 2021-07-05 DIAGNOSIS — F0390 Unspecified dementia without behavioral disturbance: Secondary | ICD-10-CM | POA: Diagnosis not present

## 2021-07-05 DIAGNOSIS — E782 Mixed hyperlipidemia: Secondary | ICD-10-CM | POA: Diagnosis not present

## 2021-07-05 DIAGNOSIS — E038 Other specified hypothyroidism: Secondary | ICD-10-CM | POA: Diagnosis not present

## 2021-07-05 DIAGNOSIS — Z66 Do not resuscitate: Secondary | ICD-10-CM | POA: Diagnosis not present

## 2021-07-05 LAB — CBC
HCT: 24 % — ABNORMAL LOW (ref 39.0–52.0)
HCT: 30.8 % — ABNORMAL LOW (ref 39.0–52.0)
Hemoglobin: 7.9 g/dL — ABNORMAL LOW (ref 13.0–17.0)
Hemoglobin: 10.2 g/dL — ABNORMAL LOW (ref 13.0–17.0)
MCH: 34.2 pg — ABNORMAL HIGH (ref 26.0–34.0)
MCH: 33.4 pg (ref 26.0–34.0)
MCHC: 32.9 g/dL (ref 30.0–36.0)
MCHC: 33.1 g/dL (ref 30.0–36.0)
MCV: 103.9 fL — ABNORMAL HIGH (ref 80.0–100.0)
MCV: 101 fL — ABNORMAL HIGH (ref 80.0–100.0)
Platelets: 186 10*3/uL (ref 150–400)
Platelets: 193 10*3/uL (ref 150–400)
RBC: 2.31 MIL/uL — ABNORMAL LOW (ref 4.22–5.81)
RBC: 3.05 MIL/uL — ABNORMAL LOW (ref 4.22–5.81)
RDW: 17.2 % — ABNORMAL HIGH (ref 11.5–15.5)
RDW: 18.1 % — ABNORMAL HIGH (ref 11.5–15.5)
WBC: 6.2 10*3/uL (ref 4.0–10.5)
WBC: 5.5 10*3/uL (ref 4.0–10.5)
nRBC: 0.3 % — ABNORMAL HIGH (ref 0.0–0.2)
nRBC: 0 % (ref 0.0–0.2)

## 2021-07-05 LAB — GLUCOSE, CAPILLARY
Glucose-Capillary: 67 mg/dL — ABNORMAL LOW (ref 70–99)
Glucose-Capillary: 88 mg/dL (ref 70–99)
Glucose-Capillary: 74 mg/dL (ref 70–99)
Glucose-Capillary: 198 mg/dL — ABNORMAL HIGH (ref 70–99)

## 2021-07-05 LAB — PREPARE RBC (CROSSMATCH)

## 2021-07-05 LAB — HEPATITIS B SURFACE ANTIBODY,QUALITATIVE: Hep B S Ab: REACTIVE — AB

## 2021-07-05 LAB — HEPATITIS B SURFACE ANTIGEN: Hepatitis B Surface Ag: NONREACTIVE

## 2021-07-05 MED ORDER — SODIUM CHLORIDE 0.9% IV SOLUTION
Freq: Once | INTRAVENOUS | Status: DC
Start: 2021-07-05 — End: 2021-07-06

## 2021-07-05 MED ORDER — DILTIAZEM LOAD VIA INFUSION: 10.0000 mg | Freq: Once | INTRAVENOUS | Status: DC

## 2021-07-05 MED ORDER — DILTIAZEM HCL 25 MG/5ML IV SOLN
10.0000 mg | INTRAVENOUS | Status: DC
  Filled 2021-07-05: qty 5

## 2021-07-05 NOTE — Progress Notes (Signed)
Pt off unit to dialysis.   Justice Rocher, RN

## 2021-07-05 NOTE — Progress Notes (Signed)
Pt receives out-pt HD at [facility] on MWF. Pt needs to arrive by 11:30 for 11:45 chair time. Pt uses transportation services. Will assist if needed.  Melven Sartorius Renal Navigator [phone]

## 2021-07-05 NOTE — Progress Notes (Addendum)
PROGRESS NOTE  [name]    DOB: Dec 03, 1940, 80 y.o.  VEL:[id number]  PCP: Johnny Showers, MD   Code Status: DNR   DOA: 07/03/2021   LOS: 0  Brief Narrative of Current Hospitalization  [name] is a 80 y.o. 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 They presented from home to the ED on 07/03/2021 with abdominal pain and melena x 2-3 days. In the ED, it was found that they had GI bleed with decreasing hemoglobin. They were treated with IV fluids and Protonix.  GI was consulted for evaluation.  Patient was admitted to medicine service for further workup and management of GI bleed as outlined in detail below.  07/05/21 -denies  Assessment & Plan  Principal Problem:   Melena Active Problems:   Essential hypertension   Hypothyroidism   ESRD on dialysis ([place])   Dementia without behavioral disturbance (HCC)   Elevated troponin level not due myocardial infarction   Mixed diabetic hyperlipidemia associated with type 2 diabetes mellitus (HCC)  GI bleed-  Hemoglobin has continued to decrease 9.1>8.2>7.9 - GI was consulted in ED and now signed off  -EGD was performed 10/11 which did not show active signs of bleeding but several potential bleeding sources were identified and treated endoscopically.  -Continue Protonix as well as octreotide -Transfusion threshold 8 -Continue IV maintenance fluid support -Advance diet as tolerated, per GI  Possible new onset A. fib RVR-HR 120s to 130s while in dialysis . began during dialysis today.  Patient hemodynamically stable without symptoms.  Possibly related to  acute anemia -Ordering stat EKG and will discuss with cards -Cardizem x1 to avoid fluid overload -Continue telemetry -Order repeat TSH was was normal last time checked  ESRD on HD MWF.  Dialysis today - consulted nephrology for routine dialysis -Continue home Renvela  Mixed  diabetic hyperlipidemia with T2DM. Hgb A1c 4.6 on admission which is well below goal for age. Fasting glucoses are in 70s. - discontinue insulin at this time. Glucose monitoring with other labs only -Continue home simvastatin  Dementia without behavioral disturbance -Continue home memantine  Hypothyroidism -Continue home Synthroid  Blood pressure abnormalities- -Continue home midodrine  History of gout -Continue home allopurinol  DVT prophylaxis: SCDs Start: 07/04/21 0041   Diet:  Diet Orders (From admission, onward)     Start     Ordered   07/04/21 1854  Diet Heart Room service appropriate? Yes; Fluid consistency: Thin  Diet effective now       Question Answer Comment  Room service appropriate? Yes   Fluid consistency: Thin      07/04/21 1853            Subjective 07/05/21    Pt reports no abdominal pain, nausea, vomiting, diarrhea or melena.  He does not have any chest pain, shortness of breath, heart flutter.  He very much wants to go home today.  Disposition Plan & Communication  Status is: Observation  The patient will require care spanning > 2 midnights and should be moved to inpatient because: Inpatient level of care appropriate due to severity of illness  Dispo: The patient is from: Home              Anticipated d/c is to: Home              Patient currently is not medically stable to d/c.  Family Communication: none   Consults, Procedures, Significant Events  Consultants:  Nephrology GI Cardiology  Procedures/significant events:  EGD HD MWF  Antimicrobials:  Anti-infectives (From admission, onward)    None        Objective   Vitals:   07/05/21 0930 07/05/21 1000 07/05/21 1024 07/05/21 1117  BP: (!) 166/74 (!) 168/101 98/78 96/61  Pulse: (!) 118 (!) 120 (!) 113 (!) 120  Resp:   14 14  Temp:    98 F (36.7 C)  TempSrc:    Oral  SpO2:    100%  Weight:      Height:        Intake/Output Summary (Last 24 hours) at 07/05/2021  1126 Last data filed at 07/05/2021 1026 Gross per 24 hour  Intake 417.67 ml  Output 829 ml  Net -411.33 ml   Filed Weights   07/05/21 0712  Weight: 71.5 kg    Patient BMI: Body mass index is 21.98 kg/m.   Physical Exam: General: awake, alert, NAD HEENT: atraumatic, clear conjunctiva, anicteric sclera, moist mucus membranes, hearing grossly normal Respiratory:normal respiratory effort. Cardiovascular:quick capillary refill  Gastrointestinal: soft, NT, ND, no HSM felt Nervous: A&O x3. no gross focal neurologic deficits, normal speech Extremities: moves all equally, no edema, normal tone Skin: dry, intact, normal temperature, normal color, No rashes, lesions or ulcers Psychiatry: normal mood, congruent affect  Labs   I have personally reviewed following labs and imaging studies Admission on 07/03/2021  Component Date Value Ref Range Status   Troponin I (High Sensitivity) 07/03/2021 36 (A) <18 ng/L Final   Sodium 07/03/2021 140  135 - 145 mmol/L Final   Potassium 07/03/2021 4.4  3.5 - 5.1 mmol/L Final   Chloride 07/03/2021 102  98 - 111 mmol/L Final   CO2 07/03/2021 25  22 - 32 mmol/L Final   Glucose, Bld 07/03/2021 110 (A) 70 - 99 mg/dL Final   BUN 07/03/2021 40 (A) 8 - 23 mg/dL Final   Creatinine, Ser 07/03/2021 8.66 (A) 0.61 - 1.24 mg/dL Final   Calcium 07/03/2021 9.1  8.9 - 10.3 mg/dL Final   Total Protein 07/03/2021 5.8 (A) 6.5 - 8.1 g/dL Final   Albumin 07/03/2021 2.9 (A) 3.5 - 5.0 g/dL Final   AST 07/03/2021 27  15 - 41 U/L Final   ALT 07/03/2021 6  0 - 44 U/L Final   Alkaline Phosphatase 07/03/2021 218 (A) 38 - 126 U/L Final   Total Bilirubin 07/03/2021 0.4  0.3 - 1.2 mg/dL Final   GFR, Estimated 07/03/2021 6 (A) >60 mL/min Final   Anion gap 07/03/2021 13  5 - 15 Final   WBC 07/03/2021 8.7  4.0 - 10.5 K/uL Final   RBC 07/03/2021 2.70 (A) 4.22 - 5.81 MIL/uL Final   Hemoglobin 07/03/2021 9.1 (A) 13.0 - 17.0 g/dL Final   HCT 07/03/2021 29.6 (A) 39.0 - 52.0 % Final    MCV 07/03/2021 109.6 (A) 80.0 - 100.0 fL Final   MCH 07/03/2021 33.7  26.0 - 34.0 pg Final   MCHC 07/03/2021 30.7  30.0 - 36.0 g/dL Final   RDW 07/03/2021 18.2 (A) 11.5 - 15.5 % Final   Platelets 07/03/2021 203  150 - 400 K/uL Final   nRBC 07/03/2021 0.2  0.0 - 0.2 % Final   Neutrophils Relative % 07/03/2021 73  % Final   Neutro Abs 07/03/2021 6.4  1.7 - 7.7 K/uL Final   Lymphocytes Relative 07/03/2021 16  % Final   Lymphs Abs 07/03/2021 1.4  0.7 - 4.0 K/uL Final   Monocytes Relative 07/03/2021 7  % Final   Monocytes Absolute 07/03/2021 0.6  0.1 - 1.0 K/uL Final   Eosinophils Relative 07/03/2021 2  % Final   Eosinophils Absolute 07/03/2021 0.2  0.0 - 0.5 K/uL Final   Basophils Relative 07/03/2021 1  % Final   Basophils Absolute 07/03/2021 0.1  0.0 - 0.1 K/uL Final   Immature Granulocytes 07/03/2021 1  % Final   Abs Immature Granulocytes 07/03/2021 0.05  0.00 - 0.07 K/uL Final   Lipase 07/03/2021 37  11 - 51 U/L Final   SARS Coronavirus 2 by RT PCR 07/03/2021 NEGATIVE  NEGATIVE Final   Influenza A by PCR 07/03/2021 NEGATIVE  NEGATIVE Final   Influenza B by PCR 07/03/2021 NEGATIVE  NEGATIVE Final   Troponin I (High Sensitivity) 07/03/2021 37 (A) <18 ng/L Final   Fecal Occult Bld 07/03/2021 POSITIVE (A) NEGATIVE Final   WBC 07/03/2021 8.4  4.0 - 10.5 K/uL Final   RBC 07/03/2021 2.71 (A) 4.22 - 5.81 MIL/uL Final   Hemoglobin 07/03/2021 9.3 (A) 13.0 - 17.0 g/dL Final   HCT 07/03/2021 29.3 (A) 39.0 - 52.0 % Final   MCV 07/03/2021 108.1 (A) 80.0 - 100.0 fL Final   MCH 07/03/2021 34.3 (A) 26.0 - 34.0 pg Final   MCHC 07/03/2021 31.7  30.0 - 36.0 g/dL Final   RDW 07/03/2021 17.9 (A) 11.5 - 15.5 % Final   Platelets 07/03/2021 215  150 - 400 K/uL Final   nRBC 07/03/2021 0.2  0.0 - 0.2 % Final   Neutrophils Relative % 07/03/2021 60  % Final   Neutro Abs 07/03/2021 5.1  1.7 - 7.7 K/uL Final   Lymphocytes Relative 07/03/2021 27  % Final   Lymphs Abs 07/03/2021 2.2  0.7 - 4.0 K/uL Final    Monocytes Relative 07/03/2021 9  % Final   Monocytes Absolute 07/03/2021 0.7  0.1 - 1.0 K/uL Final   Eosinophils Relative 07/03/2021 3  % Final   Eosinophils Absolute 07/03/2021 0.3  0.0 - 0.5 K/uL Final   Basophils Relative 07/03/2021 1  % Final   Basophils Absolute 07/03/2021 0.1  0.0 - 0.1 K/uL Final   Immature Granulocytes 07/03/2021 0  % Final   Abs Immature Granulocytes 07/03/2021 0.03  0.00 - 0.07 K/uL Final   Hgb A1c MFr Bld 07/04/2021 4.6 (A) 4.8 - 5.6 % Final   Mean Plasma Glucose 07/04/2021 85.32  mg/dL Final   Sodium 07/04/2021 136  135 - 145 mmol/L Final   Potassium 07/04/2021 4.7  3.5 - 5.1 mmol/L Final   Chloride 07/04/2021 100  98 - 111 mmol/L Final   CO2 07/04/2021 20 (A) 22 - 32 mmol/L Final   Glucose, Bld 07/04/2021 70  70 - 99 mg/dL Final   BUN 07/04/2021 46 (A) 8 - 23 mg/dL Final   Creatinine, Ser 07/04/2021 9.24 (A) 0.61 - 1.24 mg/dL Final   Calcium 07/04/2021 8.7 (A) 8.9 - 10.3 mg/dL Final   Total Protein 07/04/2021 5.4 (A) 6.5 - 8.1 g/dL Final   Albumin 07/04/2021 2.5 (A) 3.5 - 5.0 g/dL Final   AST 07/04/2021 27  15 - 41 U/L Final   ALT 07/04/2021 8  0 - 44 U/L Final   Alkaline Phosphatase 07/04/2021 210 (A) 38 - 126 U/L Final   Total Bilirubin 07/04/2021 0.8  0.3 - 1.2 mg/dL Final   GFR, Estimated 07/04/2021 5 (A) >60 mL/min Final   Anion gap 07/04/2021 16 (A) 5 - 15  Final   Magnesium 07/04/2021 2.0  1.7 - 2.4 mg/dL Final   WBC 07/04/2021 6.1  4.0 - 10.5 K/uL Final   RBC 07/04/2021 2.37 (A) 4.22 - 5.81 MIL/uL Final   Hemoglobin 07/04/2021 8.2 (A) 13.0 - 17.0 g/dL Final   HCT 07/04/2021 27.3 (A) 39.0 - 52.0 % Final   MCV 07/04/2021 115.2 (A) 80.0 - 100.0 fL Final   MCH 07/04/2021 34.6 (A) 26.0 - 34.0 pg Final   MCHC 07/04/2021 30.0  30.0 - 36.0 g/dL Final   RDW 07/04/2021 18.3 (A) 11.5 - 15.5 % Final   Platelets 07/04/2021 182  150 - 400 K/uL Final   nRBC 07/04/2021 0.5 (A) 0.0 - 0.2 % Final   Glucose-Capillary 07/04/2021 72  70 - 99 mg/dL Final    Glucose-Capillary 07/04/2021 121 (A) 70 - 99 mg/dL Final   Comment 1 07/04/2021 Notify RN   Final   Comment 2 07/04/2021 Document in Chart   Final   Hepatitis B Surface Ag 07/05/2021 NON REACTIVE  NON REACTIVE Final   Hep B S Ab 07/05/2021 Reactive (A) NON REACTIVE Final   Glucose-Capillary 07/04/2021 105 (A) 70 - 99 mg/dL Final   WBC 07/05/2021 6.2  4.0 - 10.5 K/uL Final   RBC 07/05/2021 2.31 (A) 4.22 - 5.81 MIL/uL Final   Hemoglobin 07/05/2021 7.9 (A) 13.0 - 17.0 g/dL Final   HCT 07/05/2021 24.0 (A) 39.0 - 52.0 % Final   MCV 07/05/2021 103.9 (A) 80.0 - 100.0 fL Final   MCH 07/05/2021 34.2 (A) 26.0 - 34.0 pg Final   MCHC 07/05/2021 32.9  30.0 - 36.0 g/dL Final   RDW 07/05/2021 17.2 (A) 11.5 - 15.5 % Final   Platelets 07/05/2021 186  150 - 400 K/uL Final   nRBC 07/05/2021 0.3 (A) 0.0 - 0.2 % Final   ABO/RH(D) 07/05/2021 O POS   Final   Antibody Screen 07/05/2021 NEG   Final   Sample Expiration 07/05/2021 07/08/2021,2359   Final   Unit Number 07/05/2021 [id number]   Final   Blood Component Type 07/05/2021 RED CELLS,LR   Final   Unit division 07/05/2021 00   Final   Status of Unit 07/05/2021 ISSUED   Final   Transfusion Status 07/05/2021 OK TO TRANSFUSE   Final   Crossmatch Result 07/05/2021    Final                   Value:Compatible Performed at [facility], [place] [address] [place], [place] [address]    Order Confirmation 07/05/2021    Final                   Value:ORDER PROCESSED BY BLOOD BANK Performed at [facility], [place] [address] [place], [place] [address]    [place] / TIME 07/05/2021 [id number]   Final   Blood Product Unit Number 07/05/2021 [id number]   Final   PRODUCT CODE 07/05/2021 [id number]   Final   Unit Type and Rh 07/05/2021 5100   Final   Blood Product Expiration Date 07/05/2021 [id number]   Final   Glucose-Capillary 07/05/2021 67 (A) 70 - 99 mg/dL Final    Imaging Studies  No results found. Medications   Scheduled Meds:  sodium  chloride   Intravenous Once   allopurinol  100 mg Oral Daily   Chlorhexidine Gluconate Cloth  6 each Topical Daily   darbepoetin (ARANESP) injection - DIALYSIS  150 mcg Intravenous Q Wed-HD   donepezil  10 mg  Oral QHS   feeding supplement (NEPRO CARB STEADY)  237 mL Oral TID BM   fluticasone  1 spray Each Nare Daily   levothyroxine  50 mcg Oral Q0600   memantine  10 mg Oral BID   midodrine  5 mg Oral Q M,W,F-HD   octreotide  50 mcg Subcutaneous Q12H   pantoprazole (PROTONIX) IV  40 mg Intravenous Q12H   sevelamer carbonate  2,400 mg Oral TID WC   simvastatin  20 mg Oral QHS     LOS: 0 days   Time spent: >68min  Richarda Osmond, DO [facility] 07/05/2021, 11:26 AM   To contact the [facility] Attending or Consulting provider for this patient: Check the care team in [facility] for a) attending/consulting [place] provider listed and b) the [facility] team listed Log into [email or web address] and use [place]'s universal password to access. If you do not have the password, please contact the hospital operator. Locate the [facility] provider you are looking for under [facility] and page to a number that you can be directly reached. If you still have difficulty reaching the provider, please page the [facility] (Director on Call) for the Hospitalists listed on amion for assistance.

## 2021-07-05 NOTE — Progress Notes (Signed)
[facility] Progress Note    Assessment/ Plan:    GIB/Melena - GI following s/p enteroscopy today --nonbleeding AVMs  treated with APC.   ESRD -  HD MWF. No urgent indications today. Continue on schedule. Next HD 10/12.   Hypertension/volume  - BP/volume stable.   Anemia  - Hgb 7.9. Next ESA ordered  10/12. Continue IV Fe load. 1u prbc per primary service.  Metabolic bone disease -  Ca ok. Continue home binders.   Nutrition - Renal diet/fluid restriction.   Dialysis Orders:   [facility] MWF 4h 400/A 1.5x EDW 65kg 3K/2.5Ca UFP 2  Hectrol 1 TIW Venofer 100 mg x10 (until 10/17) Mircera 150 q 2 wks (last 9/28)  Gean Quint, MD [facility]  Subjective:   Seen in room. No acute events. To receive 1u prbc. Possible d/c later today. Tolerated HD earlier.   Objective:   BP 96/61 (BP Location: Left Arm)   Pulse (!) 120   Temp 98 F (36.7 C) (Oral)   Resp 14   Ht 5\' 11"  (1.803 m)   Wt 71.5 kg   SpO2 100%   BMI 21.98 kg/m   Intake/Output Summary (Last 24 hours) at 07/05/2021 1131 Last data filed at 07/05/2021 1026 Gross per 24 hour  Intake 417.67 ml  Output 829 ml  Net -411.33 ml   Weight change:   Physical Exam: Gen:nad CVS:rrr Resp:no iwob TLX:BWIO Ext:no edema Neuro: awake, alert Dialysis Access: rue avf, rij tdc in place  Imaging: CT ABDOMEN PELVIS WO CONTRAST  Result Date: 07/03/2021 CLINICAL DATA:  Abdominal pain, left lower quadrant pain. EXAM: CT ABDOMEN AND PELVIS WITHOUT CONTRAST TECHNIQUE: Multidetector CT imaging of the abdomen and pelvis was performed following the standard protocol without IV contrast. COMPARISON:  07/17/2020 FINDINGS: Lower chest: New small bilateral pleural effusions. No confluent opacities. Hepatobiliary: No focal hepatic abnormality. Gallbladder unremarkable. Pancreas: No focal abnormality or ductal dilatation. Spleen: No focal abnormality.  Normal size. Adrenals/Urinary Tract: Kidneys are atrophic with  cortical thinning and numerous bilateral renal cysts. No hydronephrosis. No renal or ureteral stones. Adrenal glands and urinary bladder unremarkable. Stomach/Bowel: Sigmoid diverticulosis. No active diverticulitis. Stomach and small bowel decompressed, unremarkable. Vascular/Lymphatic: Heavily calcified aorta. Status post aortobifemoral bypass. No evidence of aneurysm or adenopathy. Reproductive: Central calcifications within the prostate. Other: No free fluid or free air. Musculoskeletal: No acute bony abnormality. IMPRESSION: Small bilateral pleural effusions. Few scattered sigmoid diverticula.  No active diverticulitis. Renal cortical thinning and atrophy with numerous renal cysts. No hydronephrosis. Prior aorto bi femoral bypass. No acute findings. Electronically Signed   By: Rolm Baptise M.D.   On: 07/03/2021 18:15   DG Chest 2 View  Result Date: 07/03/2021 CLINICAL DATA:  Chest pain. EXAM: CHEST - 2 VIEW COMPARISON:  January 12, 2021. FINDINGS: Mild enlargement the cardiac silhouette. Right IJ dual lumen dialysis catheter with the tip projecting at the right atrium. Calcific atherosclerosis of the aorta. No consolidation. Small bilateral pleural effusions. Mild diffuse interstitial prominence. IMPRESSION: Small bilateral effusions. Mild diffuse interstitial prominence, which may represent mild interstitial edema versus the sequela of recurrent bouts of congestive heart failure. Electronically Signed   By: Margaretha Sheffield M.D.   On: 07/03/2021 13:07    Labs: BMET Recent Labs  Lab 07/03/21 1228 07/04/21 0432  NA 140 136  K 4.4 4.7  CL 102 100  CO2 25 20*  GLUCOSE 110* 70  BUN 40* 46*  CREATININE 8.66* 9.24*  CALCIUM 9.1 8.7*   CBC  Recent Labs  Lab 07/03/21 1228 07/03/21 1835 07/04/21 0432 07/05/21 0638  WBC 8.7 8.4 6.1 6.2  NEUTROABS 6.4 5.1  --   --   HGB 9.1* 9.3* 8.2* 7.9*  HCT 29.6* 29.3* 27.3* 24.0*  MCV 109.6* 108.1* 115.2* 103.9*  PLT 203 215 182 186    Medications:      sodium chloride   Intravenous Once   allopurinol  100 mg Oral Daily   Chlorhexidine Gluconate Cloth  6 each Topical Daily   darbepoetin (ARANESP) injection - DIALYSIS  150 mcg Intravenous Q Wed-HD   donepezil  10 mg Oral QHS   feeding supplement (NEPRO CARB STEADY)  237 mL Oral TID BM   fluticasone  1 spray Each Nare Daily   levothyroxine  50 mcg Oral Q0600   memantine  10 mg Oral BID   midodrine  5 mg Oral Q M,W,F-HD   octreotide  50 mcg Subcutaneous Q12H   pantoprazole (PROTONIX) IV  40 mg Intravenous Q12H   sevelamer carbonate  2,400 mg Oral TID WC   simvastatin  20 mg Oral QHS      Gean Quint, MD [facility] 07/05/2021, 11:31 AM

## 2021-07-05 NOTE — Anesthesia Postprocedure Evaluation (Signed)
Anesthesia Post Note  Patient: LEDON [name]  Procedure(s) Performed: ENTEROSCOPY     Patient location during evaluation: PACU Anesthesia Type: MAC Level of consciousness: awake and alert Pain management: pain level controlled Vital Signs Assessment: post-procedure vital signs reviewed and stable Respiratory status: spontaneous breathing, nonlabored ventilation, respiratory function stable and patient connected to nasal cannula oxygen Cardiovascular status: stable and blood pressure returned to baseline Postop Assessment: no apparent nausea or vomiting Anesthetic complications: no   No notable events documented.  Last Vitals:  Vitals:   07/05/21 1335 07/05/21 1519  BP: (!) 143/57 (!) 166/65  Pulse: 79 77  Resp: 16 16  Temp: 36.6 C 37 C  SpO2: 99% 99%    Last Pain:  Vitals:   07/05/21 1553  TempSrc:   PainSc: Asleep                 Yesenia Fontenette

## 2021-07-05 NOTE — Plan of Care (Signed)

## 2021-07-05 NOTE — Progress Notes (Signed)
1115 - Pt back from dialysis and found to be in a-fib in 120's, which is a new finding. I sent Dr. Ouida Sills a secure chat to make her aware. New orders placed. EKG confirmed a-fib rhythm.  1240 - Pt back in NSR in 80's. Dr. Ouida Sills gave orders to cancel IV Cardizem.   Justice Rocher, RN

## 2021-07-05 NOTE — Consult Note (Signed)
Cardiology Consultation:   Patient ID: [name]; [id number]; 07/12/1941   Admit date: 07/03/2021 Date of Consult: 07/05/2021 Primary Care Provider: Elby Showers, MD Primary Cardiologist: [facility] Primary Electrophysiologist:  None   Patient Profile:   [name] is a 80 y.o. male with a hx of arrhythmia with multifocal atrial tachycardia who is being seen today for the evaluation of atrial fib at the request of Dr. Ouida Sills.  History of Present Illness:   [name] patient has a history of arrhythmia.  He is followed in our [facility].  He also has a history of hypertension, diabetes and end-stage renal disease.  He has had bilateral subdural hematomas requiring evacuation.  When he was seen in the clinic above it was for evaluation of possible atrial fibrillation but he was not thought to have A. fib and was found to have multifocal atrial tachycardia.  He wore a monitor in May with atrial tachycardia but again no evidence of atrial fibrillation.  He wore the monitor for 9 days.  He was admitted 2 days ago with abdominal pain and melena.  He had no acute findings on CT of his abdomen.  He was anemic slightly more than his baseline.  EGD was performed with no active signs of bleeding.  We are called because he was thought to be in atrial fibrillation when he returned from dialysis.  EKG did suggest atrial fibrillation with a slightly irregularly irregular rhythm with no discernible P waves.    The patient has dementia. He likely does have some recall issues.  However, he appears to be answering questions clearly.  The patient denies any new symptoms such as chest discomfort, neck or arm discomfort. There has been no new shortness of breath, PND or orthopnea. There have been no reported palpitations, presyncope or syncope.  He gets around he says with a walker.   He does report breaking his leg with a fall.  I see notes from home palliative care  apparently secondary to anorexia and failure to thrive.    Past Medical History:  Diagnosis Date   Allergy    Alzheimer's disease ([place]) 01/19/2021   Anemia    Arthritis    Cataract    bil cateracts removed   Coronary artery disease    Dementia arising in the senium and presenium ([place])    Diabetes mellitus    Type 2   Diverticulitis    ED (erectile dysfunction)    Elevated homocysteine    ESRD (end stage renal disease) on dialysis ([place]) 03/2015   M-W-F dialysis   GERD (gastroesophageal reflux disease)    pepto    Gout    Headache, unspecified 02/25/2015   Hiatal hernia    Hyperlipidemia    Hypertension    Hypothyroidism    Pneumonia    PVD (peripheral vascular disease) ([place])    has plastic aorta   Renal insufficiency    Seasonal allergies    Sleep apnea    does not wear c-pap    Past Surgical History:  Procedure Laterality Date   ANGIOPLASTY Right 01/26/2021   Procedure: ANGIOPLASTY RIGHT SUBCLAVIAN VEIN;  Surgeon: Serafina Mitchell, MD;  Location: [facility] OR;  Service: Vascular;  Laterality: Right;   aortobifemoral bypass     AV FISTULA PLACEMENT Left 12/01/2013   Procedure: ARTERIOVENOUS (AV) FISTULA CREATION- LEFT BRACHIOCEPHALIC;  Surgeon: Angelia Mould, MD;  Location: [place];  Service: Vascular;  Laterality: Left;  BASCILIC VEIN TRANSPOSITION Right 07/27/2014   Procedure: BASCILIC VEIN TRANSPOSITION;  Surgeon: Angelia Mould, MD;  Location: [place];  Service: Vascular;  Laterality: Right;   BRAIN SURGERY  07/22/2018   BREAST SURGERY     left - granulomatous mastitis   BURR HOLE Bilateral 07/22/2018   Procedure: BILATERAL BURR HOLES;  Surgeon: Kristeen Miss, MD;  Location: [place];  Service: Neurosurgery;  Laterality: Bilateral;   COLONOSCOPY     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 ([place] [facility])  1992   ENTEROSCOPY N/A 06/03/2018   Procedure: ENTEROSCOPY;  Surgeon: Lavena Bullion, DO;  Location: [facility] ENDOSCOPY;  Service: Gastroenterology;  Laterality: N/A;    ENTEROSCOPY N/A 11/20/2018   Procedure: ENTEROSCOPY;  Surgeon: Rush Landmark Telford Nab., MD;  Location: [place];  Service: Gastroenterology;  Laterality: N/A;   ENTEROSCOPY N/A 12/03/2019   Procedure: ENTEROSCOPY;  Surgeon: Lavena Bullion, DO;  Location: [facility] ENDOSCOPY;  Service: Gastroenterology;  Laterality: N/A;  push enteroscopy   ENTEROSCOPY N/A 07/04/2021   Procedure: ENTEROSCOPY;  Surgeon: Sharyn Creamer, MD;  Location: [facility] ENDOSCOPY;  Service: Gastroenterology;  Laterality: N/A;   EYE SURGERY Bilateral    cataracts   FISTULOGRAM Right 01/26/2021   Procedure: FISTULOGRAM RIGHT;  Surgeon: Serafina Mitchell, MD;  Location: [facility] OR;  Service: Vascular;  Laterality: Right;   HEMODIALYSIS INPATIENT  01/17/2018       HOT HEMOSTASIS N/A 06/03/2018   Procedure: HOT HEMOSTASIS (ARGON PLASMA COAGULATION/BICAP);  Surgeon: Lavena Bullion, DO;  Location: [facility] ENDOSCOPY;  Service: Gastroenterology;  Laterality: N/A;   HOT HEMOSTASIS N/A 11/20/2018   Procedure: HOT HEMOSTASIS (ARGON PLASMA COAGULATION/BICAP);  Surgeon: Irving Copas., MD;  Location: [place];  Service: Gastroenterology;  Laterality: N/A;   HOT HEMOSTASIS N/A 12/03/2019   Procedure: HOT HEMOSTASIS (ARGON PLASMA COAGULATION/BICAP);  Surgeon: Lavena Bullion, DO;  Location: [facility] ENDOSCOPY;  Service: Gastroenterology;  Laterality: N/A;   INTRAMEDULLARY (IM) NAIL INTERTROCHANTERIC Right 11/08/2020   Procedure: INTRAMEDULLARY (IM) NAIL INTERTROCHANTRIC;  Surgeon: Erle Crocker, MD;  Location: [place];  Service: Orthopedics;  Laterality: Right;   REVISION OF ARTERIOVENOUS GORETEX GRAFT Right 09/01/2019   Procedure: REVISION OF ARTERIOVENOUS FISTULA RIGHT ARM;  Surgeon: Serafina Mitchell, MD;  Location: [place];  Service: Vascular;  Laterality: Right;   REVISON OF ARTERIOVENOUS FISTULA Left 02/09/2014   Procedure: REVISON OF LEFT ARTERIOVENOUS FISTULA - RESECTION OF RENDUNDANT VEIN;  Surgeon: Angelia Mould, MD;  Location: [place];  Service: Vascular;  Laterality: Left;   REVISON OF ARTERIOVENOUS FISTULA Right 01/26/2021   Procedure: REVISON OF ARTERIOVENOUS FISTULA RIGHT;  Surgeon: Serafina Mitchell, MD;  Location: [place];  Service: Vascular;  Laterality: Right;   SBO with lysis adhesions     SHUNTOGRAM Left 04/19/2014   Procedure: FISTULOGRAM;  Surgeon: Angelia Mould, MD;  Location: [facility] CATH LAB;  Service: Cardiovascular;  Laterality: Left;   UNILATERAL UPPER EXTREMEITY ANGIOGRAM N/A 07/12/2014   Procedure: UNILATERAL UPPER Anselmo Rod;  Surgeon: Angelia Mould, MD;  Location: [facility] C[facility] LAB;  Service: Cardiovascular;  Laterality: N/A;     Home Medications:  Prior to Admission medications   Medication Sig Start Date End Date Taking? Authorizing Provider  acetaminophen (TYLENOL) 325 MG tablet Take 1-2 tablets (325-650 mg total) by mouth every 4 (four) hours as needed for mild pain. 08/01/18  Yes Love, Ivan Anchors, PA-C  albuterol (VENTOLIN HFA) 108 (90 Base) MCG/ACT inhaler Inhale 1 puff into the lungs daily as needed  for wheezing or shortness of breath.   Yes [provider]  allopurinol (ZYLOPRIM) 100 MG tablet Take 100 mg by mouth daily.   Yes [provider]  camphor-menthol Timoteo Ace) lotion Apply 1 application topically 4 (four) times daily as needed for itching.    Yes [provider]  Carboxymethylcellulose Sodium (THERATEARS) 0.25 % SOLN Place 1 drop into both eyes as needed (for dryness).   Yes [provider]  cetirizine (ZYRTEC) 10 MG chewable tablet Chew 10 mg by mouth every Monday, Wednesday, and Friday.   Yes [provider]  clobetasol (TEMOVATE) 0.05 % external solution Apply 1 application topically 2 (two) times daily as needed (scalp irritation.).  04/19/15  Yes [provider]  donepezil (ARICEPT) 10 MG tablet Take 1 tablet (10 mg total) by mouth at bedtime. 07/28/20  Yes Melvenia Beam, MD  FLUOCINOLONE ACETONIDE SCALP 0.01 % OIL Apply  1 application topically daily as needed (scalp irritation.).  04/28/15  Yes [provider]  fluticasone (FLONASE) 50 MCG/ACT nasal spray Place 1 spray into both nostrils daily. 10/08/14  Yes Mikhail, Velta Addison, DO  hydrocortisone 2.5 % cream Apply 1 application topically 3 (three) times daily as needed (skin irritation (legs & arms)).  01/13/19  Yes [provider]  insulin aspart (NOVOLOG FLEXPEN) 100 UNIT/ML FlexPen INJECT 6-8 UNITS UNDER THE SKIN THREE TIMES DAILY BEFORE MEALS. DX:E11.65 Patient taking differently: Inject 6-8 Units into the skin See admin instructions. Inject 6-8 units under the skin three times daily before meals as needed for diabetes. 11/25/18  Yes Elayne Snare, MD  ketoconazole (NIZORAL) 2 % shampoo Apply 1 application topically 2 (two) times a week. Patient taking differently: Apply 1 application topically 2 (two) times a week. Sundays, and any other day 03/31/15  Yes Baxley, Cresenciano Lick, MD  levothyroxine (SYNTHROID, LEVOTHROID) 50 MCG tablet Take 1 tablet (50 mcg total) by mouth daily. Patient taking differently: Take 50 mcg by mouth daily before breakfast. 08/06/14  Yes Baxley, Cresenciano Lick, MD  lidocaine (LIDODERM) 5 % Place 1 patch onto the skin daily as needed (back pain).  11/12/19  Yes [provider]  memantine (NAMENDA) 10 MG tablet Take 1 tablet (10 mg total) by mouth 2 (two) times daily. 07/28/20  Yes Melvenia Beam, MD  midodrine (PROAMATINE) 10 MG tablet Take one pill prior to hemodialysis on MWF Patient taking differently: Take 5 mg by mouth See admin instructions. Take one tablet by mouth prior to hemodialysis on MWF 08/13/18  Yes Love, Ivan Anchors, PA-C  Nutritional Supplements (FEEDING SUPPLEMENT, NEPRO CARB STEADY,) LIQD Take 237 mLs by mouth in the morning and at bedtime.    Yes [provider]  omeprazole (PRILOSEC) 40 MG capsule Take 1 capsule (40 mg total) by mouth daily. 03/13/18  Yes Esterwood, Amy S, PA-C  sevelamer carbonate (RENVELA) 800  MG tablet Take 1,600-2,400 mg by mouth See admin instructions. Take 2,400 mg by mouth three times a day with meals and 1,600 mg with snacks on Sun/Tues/Thurs/Sat and 2,400 mg with breakfast and dinner and nothing with snacks on Mon/Wed/Fri   Yes [provider]  simvastatin (ZOCOR) 20 MG tablet Take 20 mg by mouth at bedtime.   Yes [provider]  Teriparatide, Recombinant, (FORTEO) 600 MCG/2.4ML SOPN Inject 20 mcg subcutaneously daily 03/25/21  Yes Elayne Snare, MD  TRADJENTA 5 MG TABS tablet TAKE 1 TABLET DAILY Patient taking differently: Take 5 mg by mouth daily. 03/01/20  Yes Elayne Snare, MD  Blood Glucose Monitoring Suppl (FREESTYLE FREEDOM LITE) w/Device KIT Use to check blood sugar 2 times per day dx code E11.65 10/21/18   Elayne Snare, MD  cyanocobalamin (,VITAMIN B-12,) 1000 MCG/ML injection Inject 1 mL (1,000 mcg total) into the muscle every 30 (thirty) days. 05/18/21   Elby Showers, MD  doxycycline (VIBRAMYCIN) 100 MG capsule Take 1 capsule (100 mg total) by mouth 2 (two) times daily. Patient not taking: No sig reported 04/11/21   Petrucelli, Samantha R, PA-C  glucose blood (FREESTYLE LITE) test strip USE AS DIRECTED THREE TIMES DAILY 01/16/21   Elayne Snare, MD  HYDROcodone-acetaminophen (NORCO/VICODIN) 5-325 MG tablet Take 1 tablet by mouth every 4 (four) hours as needed for moderate pain. Patient not taking: No sig reported 01/26/21 01/26/22  Setzer, Edman Circle, PA-C  Insulin Pen Needle (PEN NEEDLES) 32G X 4 MM MISC 1 each by Does not apply route 3 (three) times daily. USE PEN NEEDLES TO INJECT INSULIN THREE TIMES DAILY. 11/25/18   Elayne Snare, MD  Lancets (FREESTYLE) lancets Use as instructed to check blood sugar 2 times per day dx code E11.65 02/22/16   Elayne Snare, MD    Inpatient Medications: Scheduled Meds:  sodium chloride   Intravenous Once   allopurinol  100 mg Oral Daily   Chlorhexidine Gluconate Cloth  6 each Topical Daily   darbepoetin (ARANESP) injection - DIALYSIS   150 mcg Intravenous Q Wed-HD   donepezil  10 mg Oral QHS   feeding supplement (NEPRO CARB STEADY)  237 mL Oral TID BM   fluticasone  1 spray Each Nare Daily   levothyroxine  50 mcg Oral Q0600   memantine  10 mg Oral BID   midodrine  5 mg Oral Q M,W,F-HD   octreotide  50 mcg Subcutaneous Q12H   pantoprazole (PROTONIX) IV  40 mg Intravenous Q12H   sevelamer carbonate  2,400 mg Oral TID WC   simvastatin  20 mg Oral QHS   Continuous Infusions:  sodium chloride 10 mL/hr at 07/04/21 1114   ferric gluconate (FERRLECIT) IVPB     PRN Meds: acetaminophen **OR** acetaminophen, albuterol, ondansetron **OR** ondansetron (ZOFRAN) IV, sevelamer carbonate  Allergies:    Allergies  Allergen Reactions   Ambien [Zolpidem Tartrate] Other (See Comments)    Hallucinations and "felt crazy"    Penicillins Rash and Hives    Has patient had a PCN reaction causing immediate rash, facial/tongue/throat swelling, SOB or lightheadedness with hypotension: Yes Has patient had a PCN reaction causing severe rash involving mucus membranes or skin necrosis: Yes Has patient had a PCN reaction that required hospitalization: No Has patient had a PCN reaction occurring within the last 10 years: No If all of the above answers are "NO", then may proceed with Cephalosporin use.  Other reaction(s): HIVES    Social History:   Social History   Socioeconomic History   Marital status: Married    Spouse name: [name] Mood   Number of children: 1   Years of education: 16   Highest education level: Not on file  Occupational History   Occupation: Retired  Tobacco Use   Smoking status: Former    Types: Cigarettes    Quit date: 09/24/1994    Years since quitting: 26.7   Smokeless tobacco: Never  Vaping Use   Vaping Use: Never used  Substance and Sexual Activity   Alcohol use: No    Alcohol/week: 0.0 standard drinks    Comment: Quit Oct. 1977 ("somewhat heavy")   Drug use: No  Sexual activity: Not on file  Other Topics  Concern   Not on file  Social History Narrative   Lives at home with wife.   Caffeine use: Drinks no tea, "a little soda"   Drinks 1 cup coffee/week   Right handed   VETERAN   Social Determinants of Health   Financial Resource Strain: Not on file  Food Insecurity: No Food Insecurity   Worried About Charity fundraiser in the Last Year: Never true   Ran Out of Food in the Last Year: Never true  Transportation Needs: No Transportation Needs   Lack of Transportation (Medical): No   Lack of Transportation (Non-Medical): No  Physical Activity: Not on file  Stress: Not on file  Social Connections: Not on file  Intimate Partner Violence: Not on file    Family History:    Family History  Problem Relation Age of Onset   Aneurysm Mother        brain   Heart disease Father    Stroke Father    Hypertension Father    Diabetes Father    Dementia Neg Hx    Colon cancer Neg Hx    Esophageal cancer Neg Hx    Pancreatic cancer Neg Hx    Prostate cancer Neg Hx    Rectal cancer Neg Hx    Stomach cancer Neg Hx      ROS:  Please see the history of present illness.  Likely not reliable with some dementia All other ROS reviewed and negative.     Physical Exam/Data:   Vitals:   07/05/21 1117 07/05/21 1148 07/05/21 1244 07/05/21 1335  BP: 96/61 (!) 142/75 (!) 148/63 (!) 143/57  Pulse: (!) 120 (!) 126 82 79  Resp: 14 16 17 16   Temp: 98 F (36.7 C) 98 F (36.7 C)  97.8 F (36.6 C)  TempSrc: Oral Oral  Oral  SpO2: 100% 95% 97% 99%  Weight:      Height:        Intake/Output Summary (Last 24 hours) at 07/05/2021 1508 Last data filed at 07/05/2021 1026 Gross per 24 hour  Intake 167.67 ml  Output 829 ml  Net -661.33 ml   Filed Weights   07/05/21 0712  Weight: 71.5 kg   Body mass index is 21.98 kg/m.  GENERAL:  Chronically ill appearing HEENT:   Pupils equal round and reactive, fundi not visualized, oral mucosa unremarkable NECK:  No  jugular venous distention, waveform  within normal limits, carotid upstroke brisk and symmetric, no bruits, no thyromegaly LYMPHATICS:  No cervical, inguinal adenopathy LUNGS:   Clear to auscultation bilaterally BACK:  No CVA tenderness CHEST:   Unremarkable HEART:  PMI not displaced or sustained,S1 and S2 within normal limits, no S3, no S4, no clicks, no rubs, 3/6 apical systolic murmur, no diastolic  murmurs ABD:  Flat, positive bowel sounds normal in frequency in pitch, no bruits, no rebound, no guarding, no midline pulsatile mass, no hepatomegaly, no splenomegaly EXT:  2 plus pulses throughout, no  edema, no cyanosis no clubbing SKIN:  No rashes no nodules NEURO:   Cranial nerves II through XII grossly intact, motor grossly intact throughout PSYCH:    Cognitively intact, oriented to person place and time   EKG:  The EKG was personally reviewed and demonstrates:  Atrial fib with rate 132 Telemetry:  Telemetry was personally reviewed and demonstrates:  NSR, PAT  Relevant CV Studies:  ECHO 10/2020   1. Left ventricular ejection fraction, by  estimation, is 55 to 60%. The  left ventricle has normal function. The left ventricle has no regional  wall motion abnormalities. There is mild left ventricular hypertrophy.  Left ventricular diastolic parameters  are consistent with Grade I diastolic dysfunction (impaired relaxation).   2. Right ventricular systolic function is normal. The right ventricular  size is normal. There is mildly elevated pulmonary artery systolic  pressure. The estimated right ventricular systolic pressure is 24.5 mmHg.   3. The mitral valve is normal in structure. Trivial mitral valve  regurgitation. No evidence of mitral stenosis.   4. The aortic valve is tricuspid. Aortic valve regurgitation is not  visualized. Mild aortic valve sclerosis is present, with no evidence of  aortic valve stenosis.   5. The inferior vena cava is normal in size with greater than 50%  respiratory variability, suggesting  right atrial pressure of 3 mmHg.  Laboratory Data:  Chemistry Recent Labs  Lab 07/03/21 1228 07/04/21 0432  NA 140 136  K 4.4 4.7  CL 102 100  CO2 25 20*  GLUCOSE 110* 70  BUN 40* 46*  CREATININE 8.66* 9.24*  CALCIUM 9.1 8.7*  GFRNONAA 6* 5*  ANIONGAP 13 16*    Recent Labs  Lab 07/03/21 1228 07/04/21 0432  PROT 5.8* 5.4*  ALBUMIN 2.9* 2.5*  AST 27 27  ALT 6 8  ALKPHOS 218* 210*  BILITOT 0.4 0.8   Hematology Recent Labs  Lab 07/03/21 1835 07/04/21 0432 07/05/21 0638  WBC 8.4 6.1 6.2  RBC 2.71* 2.37* 2.31*  HGB 9.3* 8.2* 7.9*  HCT 29.3* 27.3* 24.0*  MCV 108.1* 115.2* 103.9*  MCH 34.3* 34.6* 34.2*  MCHC 31.7 30.0 32.9  RDW 17.9* 18.3* 17.2*  PLT 215 182 186   Cardiac EnzymesNo results for input(s): TROPONINI in the last 168 hours. No results for input(s): TROPIPOC in the last 168 hours.  BNPNo results for input(s): BNP, PROBNP in the last 168 hours.  DDimer No results for input(s): DDIMER in the last 168 hours.  Radiology/Studies:  CT ABDOMEN PELVIS WO CONTRAST  Result Date: 07/03/2021 CLINICAL DATA:  Abdominal pain, left lower quadrant pain. EXAM: CT ABDOMEN AND PELVIS WITHOUT CONTRAST TECHNIQUE: Multidetector CT imaging of the abdomen and pelvis was performed following the standard protocol without IV contrast. COMPARISON:  07/17/2020 FINDINGS: Lower chest: New small bilateral pleural effusions. No confluent opacities. Hepatobiliary: No focal hepatic abnormality. Gallbladder unremarkable. Pancreas: No focal abnormality or ductal dilatation. Spleen: No focal abnormality.  Normal size. Adrenals/Urinary Tract: Kidneys are atrophic with cortical thinning and numerous bilateral renal cysts. No hydronephrosis. No renal or ureteral stones. Adrenal glands and urinary bladder unremarkable. Stomach/Bowel: Sigmoid diverticulosis. No active diverticulitis. Stomach and small bowel decompressed, unremarkable. Vascular/Lymphatic: Heavily calcified aorta. Status post  aortobifemoral bypass. No evidence of aneurysm or adenopathy. Reproductive: Central calcifications within the prostate. Other: No free fluid or free air. Musculoskeletal: No acute bony abnormality. IMPRESSION: Small bilateral pleural effusions. Few scattered sigmoid diverticula.  No active diverticulitis. Renal cortical thinning and atrophy with numerous renal cysts. No hydronephrosis. Prior aorto bi femoral bypass. No acute findings. Electronically Signed   By: Rolm Baptise M.D.   On: 07/03/2021 18:15   DG Chest 2 View  Result Date: 07/03/2021 CLINICAL DATA:  Chest pain. EXAM: CHEST - 2 VIEW COMPARISON:  January 12, 2021. FINDINGS: Mild enlargement the cardiac silhouette. Right IJ dual lumen dialysis catheter with the tip projecting at the right atrium. Calcific atherosclerosis of the aorta. No consolidation. Small bilateral pleural effusions. Mild  diffuse interstitial prominence. IMPRESSION: Small bilateral effusions. Mild diffuse interstitial prominence, which may represent mild interstitial edema versus the sequela of recurrent bouts of congestive heart failure. Electronically Signed   By: Margaretha Sheffield M.D.   On: 07/03/2021 13:07    Assessment and Plan:   Arrhythmia: The patient has likely some brief episodes of atrial fib.  However, these are short and likely some of this is more organized MAT.  Given the brief nature and the high risk for anticoagulation, I would not suggest DOAC.    Hypertension: His BP is elevated here but he is on midodrine on dialysis days. I would not pursue more aggressive BP control given this.  No change in therapy.     For questions or updates, please contact [place] Please consult [email or web address] for contact info under Cardiology/STEMI.   Signed, Minus Breeding, MD  07/05/2021 3:08 PM

## 2021-07-05 NOTE — Discharge Summary (Signed)
Physician Discharge Summary  [name] YIA:[id number] DOB: [birth year] DOA: 07/03/2021  PCP: Elby Showers, MD  Admit date: 07/03/2021 Discharge date: 07/06/2021  Admitted From: home Disposition:  home  Recommendations for Outpatient Follow-up:  Follow up with PCP within 1-2 weeks for evaluation of anemia Follow up with cardiology 11/1 for new onset pAfib Follow up with nephrology for HD Follow up with Gi for GI bleed  Discharge Condition:stable CODE STATUS:DNR Diet recommendation: renal  Brief/Interim Summary: Pt presented with symptomatic anemia 2/2 GI bleed in form of melena. GI evaluation revealed potential sources of bleed that were not actively bleeding and were treated interoperatively. He received 1 unit pRBCs while inpatient and remained stable prior to dc. He had one episode of self-resolving Afib RVR and cardiology was consulted. No interventions were given and he was scheduled for OP follow up. He received routine HD while inpatient without complications.   Discharge Diagnoses:  Principal Problem:   Melena Active Problems:   Essential hypertension   Hypothyroidism   ESRD on dialysis ([place])   Dementia without behavioral disturbance (HCC)   Elevated troponin level not due myocardial infarction   Mixed diabetic hyperlipidemia associated with type 2 diabetes mellitus (HCC)    Allergies as of 07/06/2021       Reactions   Ambien [zolpidem Tartrate] Other (See Comments)   Hallucinations and "felt crazy"    Penicillins Rash, Hives   Has patient had a PCN reaction causing immediate rash, facial/tongue/throat swelling, SOB or lightheadedness with hypotension: Yes Has patient had a PCN reaction causing severe rash involving mucus membranes or skin necrosis: Yes Has patient had a PCN reaction that required hospitalization: No Has patient had a PCN reaction occurring within the last 10 years: No If all of the above answers are "NO", then may proceed with Cephalosporin  use. Other reaction(s): HIVES        Medication List     STOP taking these medications    cyanocobalamin 1000 MCG/ML injection Commonly known as: (VITAMIN B-12)   doxycycline 100 MG capsule Commonly known as: VIBRAMYCIN   HYDROcodone-acetaminophen 5-325 MG tablet Commonly known as: NORCO/VICODIN   insulin aspart 100 UNIT/ML FlexPen Commonly known as: NovoLOG FlexPen       TAKE these medications    acetaminophen 325 MG tablet Commonly known as: TYLENOL Take 1-2 tablets (325-650 mg total) by mouth every 4 (four) hours as needed for mild pain.   albuterol 108 (90 Base) MCG/ACT inhaler Commonly known as: VENTOLIN HFA Inhale 1 puff into the lungs daily as needed for wheezing or shortness of breath.   allopurinol 100 MG tablet Commonly known as: ZYLOPRIM Take 100 mg by mouth daily.   camphor-menthol lotion Commonly known as: SARNA Apply 1 application topically 4 (four) times daily as needed for itching.   cetirizine 10 MG chewable tablet Commonly known as: ZYRTEC Chew 10 mg by mouth every Monday, Wednesday, and Friday.   clobetasol 0.05 % external solution Commonly known as: TEMOVATE Apply 1 application topically 2 (two) times daily as needed (scalp irritation.).   donepezil 10 MG tablet Commonly known as: ARICEPT Take 1 tablet (10 mg total) by mouth at bedtime.   feeding supplement (NEPRO CARB STEADY) Liqd Take 237 mLs by mouth in the morning and at bedtime.   Fluocinolone Acetonide Scalp 0.01 % Oil Apply 1 application topically daily as needed (scalp irritation.).   fluticasone 50 MCG/ACT nasal spray Commonly known as: FLONASE Place 1 spray into both nostrils daily.  Forteo 600 MCG/2.4ML Sopn Generic drug: Teriparatide (Recombinant) Inject 20 mcg subcutaneously daily   FreeStyle Freedom Lite w/Device Kit Use to check blood sugar 2 times per day dx code E11.65   freestyle lancets Use as instructed to check blood sugar 2 times per day dx code  E11.65   FREESTYLE LITE test strip Generic drug: glucose blood USE AS DIRECTED THREE TIMES DAILY   hydrocortisone 2.5 % cream Apply 1 application topically 3 (three) times daily as needed (skin irritation (legs & arms)).   ketoconazole 2 % shampoo Commonly known as: Nizoral Apply 1 application topically 2 (two) times a week. What changed: additional instructions   levothyroxine 50 MCG tablet Commonly known as: SYNTHROID Take 1 tablet (50 mcg total) by mouth daily. What changed: when to take this   lidocaine 5 % Commonly known as: LIDODERM Place 1 patch onto the skin daily as needed (back pain).   memantine 10 MG tablet Commonly known as: NAMENDA Take 1 tablet (10 mg total) by mouth 2 (two) times daily.   midodrine 10 MG tablet Commonly known as: PROAMATINE Take one pill prior to hemodialysis on MWF What changed:  how much to take how to take this when to take this additional instructions   omeprazole 40 MG capsule Commonly known as: PRILOSEC Take 1 capsule (40 mg total) by mouth daily.   Pen Needles 32G X 4 MM Misc 1 each by Does not apply route 3 (three) times daily. USE PEN NEEDLES TO INJECT INSULIN THREE TIMES DAILY.   sevelamer carbonate 800 MG tablet Commonly known as: RENVELA Take 1,600-2,400 mg by mouth See admin instructions. Take 2,400 mg by mouth three times a day with meals and 1,600 mg with snacks on Sun/Tues/Thurs/Sat and 2,400 mg with breakfast and dinner and nothing with snacks on Mon/Wed/Fri   simvastatin 20 MG tablet Commonly known as: ZOCOR Take 20 mg by mouth at bedtime.   Theratears 0.25 % Soln Generic drug: Carboxymethylcellulose Sodium Place 1 drop into both eyes as needed (for dryness).   Tradjenta 5 MG Tabs tablet Generic drug: linagliptin TAKE 1 TABLET DAILY What changed: how much to take        Allergies  Allergen Reactions   Ambien [Zolpidem Tartrate] Other (See Comments)    Hallucinations and "felt crazy"    Penicillins  Rash and Hives    Has patient had a PCN reaction causing immediate rash, facial/tongue/throat swelling, SOB or lightheadedness with hypotension: Yes Has patient had a PCN reaction causing severe rash involving mucus membranes or skin necrosis: Yes Has patient had a PCN reaction that required hospitalization: No Has patient had a PCN reaction occurring within the last 10 years: No If all of the above answers are "NO", then may proceed with Cephalosporin use.  Other reaction(s): HIVES   Consultations: Cardiology Nephrology GI  Procedures/Studies: CT ABDOMEN PELVIS WO CONTRAST  Result Date: 07/03/2021 CLINICAL DATA:  Abdominal pain, left lower quadrant pain. EXAM: CT ABDOMEN AND PELVIS WITHOUT CONTRAST TECHNIQUE: Multidetector CT imaging of the abdomen and pelvis was performed following the standard protocol without IV contrast. COMPARISON:  07/17/2020 FINDINGS: Lower chest: New small bilateral pleural effusions. No confluent opacities. Hepatobiliary: No focal hepatic abnormality. Gallbladder unremarkable. Pancreas: No focal abnormality or ductal dilatation. Spleen: No focal abnormality.  Normal size. Adrenals/Urinary Tract: Kidneys are atrophic with cortical thinning and numerous bilateral renal cysts. No hydronephrosis. No renal or ureteral stones. Adrenal glands and urinary bladder unremarkable. Stomach/Bowel: Sigmoid diverticulosis. No active diverticulitis. Stomach and small  bowel decompressed, unremarkable. Vascular/Lymphatic: Heavily calcified aorta. Status post aortobifemoral bypass. No evidence of aneurysm or adenopathy. Reproductive: Central calcifications within the prostate. Other: No free fluid or free air. Musculoskeletal: No acute bony abnormality. IMPRESSION: Small bilateral pleural effusions. Few scattered sigmoid diverticula.  No active diverticulitis. Renal cortical thinning and atrophy with numerous renal cysts. No hydronephrosis. Prior aorto bi femoral bypass. No acute findings.  Electronically Signed   By: Rolm Baptise M.D.   On: 07/03/2021 18:15   DG Chest 2 View  Result Date: 07/03/2021 CLINICAL DATA:  Chest pain. EXAM: CHEST - 2 VIEW COMPARISON:  January 12, 2021. FINDINGS: Mild enlargement the cardiac silhouette. Right IJ dual lumen dialysis catheter with the tip projecting at the right atrium. Calcific atherosclerosis of the aorta. No consolidation. Small bilateral pleural effusions. Mild diffuse interstitial prominence. IMPRESSION: Small bilateral effusions. Mild diffuse interstitial prominence, which may represent mild interstitial edema versus the sequela of recurrent bouts of congestive heart failure. Electronically Signed   By: Margaretha Sheffield M.D.   On: 07/03/2021 13:07    Subjective: Patient feels well today. He wanted to go home yesterday. No complaints or concerns at this time.   Discharge Exam: Vitals:   07/06/21 0358 07/06/21 0758  BP: (!) 173/66 (!) 151/60  Pulse: 75 70  Resp: 15 15  Temp: 98.3 F (36.8 C) 98.1 F (36.7 C)  SpO2: 97% 97%    General: Pt is alert, awake, not in acute distress Cardiovascular: RRR, S1/S2 +, no rubs, no gallops Respiratory: normal respiratory effort Abdominal: Soft, NT, ND, bowel sounds + Extremities: no edema, no cyanosis  Labs: Basic Metabolic Panel: Recent Labs  Lab 07/03/21 1228 07/04/21 0432 07/06/21 0121  NA 140 136 138  K 4.4 4.7 4.4  CL 102 100 102  CO2 25 20* 26  GLUCOSE 110* 70 100*  BUN 40* 46* 22  CREATININE 8.66* 9.24* 6.55*  CALCIUM 9.1 8.7* 8.7*  MG  --  2.0  --   PHOS  --   --  4.1   CBC: Recent Labs  Lab 07/03/21 1228 07/03/21 1835 07/04/21 0432 07/05/21 0638 07/05/21 1535 07/06/21 0121  WBC 8.7 8.4 6.1 6.2 5.5 6.4  NEUTROABS 6.4 5.1  --   --   --   --   HGB 9.1* 9.3* 8.2* 7.9* 10.2* 9.8*  HCT 29.6* 29.3* 27.3* 24.0* 30.8* 30.7*  MCV 109.6* 108.1* 115.2* 103.9* 101.0* 103.0*  PLT 203 215 182 186 193 201    Microbiology Recent Results (from the past 240 hour(s))   Resp Panel by RT-PCR (Flu A&B, Covid) Nasopharyngeal Swab     Status: None   Collection Time: 07/03/21 12:17 PM   Specimen: Nasopharyngeal Swab; Nasopharyngeal(NP) swabs in vial transport medium  Result Value Ref Range Status   SARS Coronavirus 2 by RT PCR NEGATIVE NEGATIVE Final    Comment: (NOTE) SARS-CoV-2 target nucleic acids are NOT DETECTED.  The SARS-CoV-2 RNA is generally detectable in upper respiratory specimens during the acute phase of infection. The lowest concentration of SARS-CoV-2 viral copies this assay can detect is 138 copies/mL. A negative result does not preclude SARS-Cov-2 infection and should not be used as the sole basis for treatment or other patient management decisions. A negative result may occur with  improper specimen collection/handling, submission of specimen other than nasopharyngeal swab, presence of viral mutation(s) within the areas targeted by this assay, and inadequate number of viral copies(<138 copies/mL). A negative result must be combined with clinical observations, patient history,  and epidemiological information. The expected result is Negative.  Fact Sheet for Patients:  EntrepreneurPulse.com.au  Fact Sheet for Healthcare Providers:  IncredibleEmployment.be  This test is no t yet approved or cleared by the [place] FDA and  has been authorized for detection and/or diagnosis of SARS-CoV-2 by FDA under an Emergency Use Authorization (EUA). This EUA will remain  in effect (meaning this test can be used) for the duration of the COVID-19 declaration under Section 564(b)(1) of the Act, 21 U.S.C.section 360bbb-3(b)(1), unless the authorization is terminated  or revoked sooner.       Influenza A by PCR NEGATIVE NEGATIVE Final   Influenza B by PCR NEGATIVE NEGATIVE Final    Comment: (NOTE) The Xpert Xpress SARS-CoV-2/FLU/RSV plus assay is intended as an aid in the diagnosis of influenza from  Nasopharyngeal swab specimens and should not be used as a sole basis for treatment. Nasal washings and aspirates are unacceptable for Xpert Xpress SARS-CoV-2/FLU/RSV testing.  Fact Sheet for Patients: EntrepreneurPulse.com.au  Fact Sheet for Healthcare Providers: IncredibleEmployment.be  This test is not yet approved or cleared by the [place] FDA and has been authorized for detection and/or diagnosis of SARS-CoV-2 by FDA under an Emergency Use Authorization (EUA). This EUA will remain in effect (meaning this test can be used) for the duration of the COVID-19 declaration under Section 564(b)(1) of the Act, 21 U.S.C. section 360bbb-3(b)(1), unless the authorization is terminated or revoked.  Performed at [facility], [place] [address], [place], [place] [address]     Time coordinating discharge: Over 30 minutes  Richarda Osmond, MD  [facility] 07/06/2021, 12:23 PM Pager   If 7PM-7AM, please contact night-coverage [email or web address] Password [email or web address]

## 2021-07-06 ENCOUNTER — Telehealth: Payer: Self-pay | Admitting: Internal Medicine

## 2021-07-06 DIAGNOSIS — R778 Other specified abnormalities of plasma proteins: Secondary | ICD-10-CM | POA: Diagnosis not present

## 2021-07-06 DIAGNOSIS — K922 Gastrointestinal hemorrhage, unspecified: Secondary | ICD-10-CM | POA: Diagnosis not present

## 2021-07-06 DIAGNOSIS — D539 Nutritional anemia, unspecified: Secondary | ICD-10-CM | POA: Diagnosis present

## 2021-07-06 DIAGNOSIS — D631 Anemia in chronic kidney disease: Secondary | ICD-10-CM | POA: Diagnosis not present

## 2021-07-06 DIAGNOSIS — I120 Hypertensive chronic kidney disease with stage 5 chronic kidney disease or end stage renal disease: Secondary | ICD-10-CM | POA: Diagnosis not present

## 2021-07-06 DIAGNOSIS — D500 Iron deficiency anemia secondary to blood loss (chronic): Secondary | ICD-10-CM | POA: Diagnosis present

## 2021-07-06 DIAGNOSIS — K219 Gastro-esophageal reflux disease without esophagitis: Secondary | ICD-10-CM | POA: Diagnosis present

## 2021-07-06 DIAGNOSIS — E1169 Type 2 diabetes mellitus with other specified complication: Secondary | ICD-10-CM | POA: Diagnosis not present

## 2021-07-06 DIAGNOSIS — F0390 Unspecified dementia without behavioral disturbance: Secondary | ICD-10-CM | POA: Diagnosis not present

## 2021-07-06 DIAGNOSIS — E8889 Other specified metabolic disorders: Secondary | ICD-10-CM | POA: Diagnosis not present

## 2021-07-06 DIAGNOSIS — K5521 Angiodysplasia of colon with hemorrhage: Secondary | ICD-10-CM | POA: Diagnosis not present

## 2021-07-06 DIAGNOSIS — N400 Enlarged prostate without lower urinary tract symptoms: Secondary | ICD-10-CM | POA: Diagnosis present

## 2021-07-06 DIAGNOSIS — K921 Melena: Secondary | ICD-10-CM | POA: Diagnosis not present

## 2021-07-06 DIAGNOSIS — F0280 Dementia in other diseases classified elsewhere without behavioral disturbance: Secondary | ICD-10-CM | POA: Diagnosis present

## 2021-07-06 DIAGNOSIS — G309 Alzheimer's disease, unspecified: Secondary | ICD-10-CM | POA: Diagnosis present

## 2021-07-06 DIAGNOSIS — I2510 Atherosclerotic heart disease of native coronary artery without angina pectoris: Secondary | ICD-10-CM | POA: Diagnosis present

## 2021-07-06 DIAGNOSIS — G4733 Obstructive sleep apnea (adult) (pediatric): Secondary | ICD-10-CM | POA: Diagnosis present

## 2021-07-06 DIAGNOSIS — N186 End stage renal disease: Secondary | ICD-10-CM | POA: Diagnosis not present

## 2021-07-06 DIAGNOSIS — I4891 Unspecified atrial fibrillation: Secondary | ICD-10-CM | POA: Diagnosis present

## 2021-07-06 DIAGNOSIS — Z880 Allergy status to penicillin: Secondary | ICD-10-CM | POA: Diagnosis not present

## 2021-07-06 DIAGNOSIS — K31811 Angiodysplasia of stomach and duodenum with bleeding: Secondary | ICD-10-CM | POA: Diagnosis not present

## 2021-07-06 DIAGNOSIS — E039 Hypothyroidism, unspecified: Secondary | ICD-10-CM | POA: Diagnosis not present

## 2021-07-06 DIAGNOSIS — E782 Mixed hyperlipidemia: Secondary | ICD-10-CM | POA: Diagnosis not present

## 2021-07-06 DIAGNOSIS — E038 Other specified hypothyroidism: Secondary | ICD-10-CM | POA: Diagnosis not present

## 2021-07-06 DIAGNOSIS — Z66 Do not resuscitate: Secondary | ICD-10-CM | POA: Diagnosis not present

## 2021-07-06 DIAGNOSIS — N2581 Secondary hyperparathyroidism of renal origin: Secondary | ICD-10-CM | POA: Diagnosis present

## 2021-07-06 DIAGNOSIS — Z20822 Contact with and (suspected) exposure to covid-19: Secondary | ICD-10-CM | POA: Diagnosis not present

## 2021-07-06 DIAGNOSIS — E1122 Type 2 diabetes mellitus with diabetic chronic kidney disease: Secondary | ICD-10-CM | POA: Diagnosis not present

## 2021-07-06 DIAGNOSIS — Z992 Dependence on renal dialysis: Secondary | ICD-10-CM | POA: Diagnosis not present

## 2021-07-06 DIAGNOSIS — Z888 Allergy status to other drugs, medicaments and biological substances status: Secondary | ICD-10-CM | POA: Diagnosis not present

## 2021-07-06 LAB — TYPE AND SCREEN
ABO/RH(D): O POS
Antibody Screen: NEGATIVE
Unit division: 0

## 2021-07-06 LAB — GLUCOSE, CAPILLARY: Glucose-Capillary: 94 mg/dL (ref 70–99)

## 2021-07-06 LAB — RENAL FUNCTION PANEL
Albumin: 2.7 g/dL — ABNORMAL LOW (ref 3.5–5.0)
Anion gap: 10 (ref 5–15)
BUN: 22 mg/dL (ref 8–23)
CO2: 26 mmol/L (ref 22–32)
Calcium: 8.7 mg/dL — ABNORMAL LOW (ref 8.9–10.3)
Chloride: 102 mmol/L (ref 98–111)
Creatinine, Ser: 6.55 mg/dL — ABNORMAL HIGH (ref 0.61–1.24)
GFR, Estimated: 9 mL/min — ABNORMAL LOW (ref >60)
Glucose, Bld: 100 mg/dL — ABNORMAL HIGH (ref 70–99)
Phosphorus: 4.1 mg/dL (ref 2.5–4.6)
Potassium: 4.4 mmol/L (ref 3.5–5.1)
Sodium: 138 mmol/L (ref 135–145)

## 2021-07-06 LAB — BPAM RBC
Blood Product Expiration Date: 202211032359
ISSUE DATE / TIME: 202210121117
Unit Type and Rh: 5100

## 2021-07-06 LAB — CBC
HCT: 30.7 % — ABNORMAL LOW (ref 39.0–52.0)
Hemoglobin: 9.8 g/dL — ABNORMAL LOW (ref 13.0–17.0)
MCH: 32.9 pg (ref 26.0–34.0)
MCHC: 31.9 g/dL (ref 30.0–36.0)
MCV: 103 fL — ABNORMAL HIGH (ref 80.0–100.0)
Platelets: 201 10*3/uL (ref 150–400)
RBC: 2.98 MIL/uL — ABNORMAL LOW (ref 4.22–5.81)
RDW: 18.5 % — ABNORMAL HIGH (ref 11.5–15.5)
WBC: 6.4 10*3/uL (ref 4.0–10.5)
nRBC: 0 % (ref 0.0–0.2)

## 2021-07-06 LAB — HEPATITIS B SURFACE ANTIBODY, QUANTITATIVE: Hep B S AB Quant (Post): 247.6 m[IU]/mL (ref >9.9)

## 2021-07-06 LAB — TSH: TSH: 4.198 u[IU]/mL (ref 0.350–4.500)

## 2021-07-06 NOTE — Progress Notes (Addendum)
Plan for dc noted. Telemetry has only shown NSR overnight. Have arranged f/u 07/25/21 at 10:15am with Coletta Memos NP. I spoke with nurse - patient is already gone so I called wife [name] (on [place]) and relayed follow-up appt info. Per our discussion she also has access to MyChart to view the appt info further.

## 2021-07-06 NOTE — Telephone Encounter (Signed)
Scheduled Hospital Follow Up

## 2021-07-06 NOTE — Telephone Encounter (Signed)
Nikki patient has been released today from hospital will you do follow up phone call. Best to speak with wife [name].  Discharge summary says to do a follow up evaluation in 1-2 weeks for anemia. I am looking at 07/14/2021 at 12:00  [facility] called to say that Rush Landmark had come home today from hospital and that his wife had contacted them about Home Health needs. She wanted verbal orders, let her know we do not do verbal orders. If she will fax orders that Dr Renold Genta will look them over and then sign them.

## 2021-07-06 NOTE — Progress Notes (Signed)
Pt and wife told about discharge orders, wife is elderly and requested that we wait to discharge him until this morning since she doesn't feel safe driving at night or getting him in the house after dark and it was already late.

## 2021-07-06 NOTE — Telephone Encounter (Signed)
Transition Care Management Follow-up Telephone Call Date of discharge and from where: 07/06/21 at [facility] How have you been since you were released from the hospital? He doesn't feel any better but he doesn't do well in hospital  Any questions or concerns? No  Items Reviewed: Did the pt receive and understand the discharge instructions provided? Yes  Medications obtained and verified? Yes  Other? Yes  Any new allergies since your discharge? No  Dietary orders reviewed? Yes Do you have support at home? Yes   Home Care and Equipment/Supplies: Were home health services ordered? yes If so, what is the name of the agency? [facility]  Has the agency set up a time to come to the patient's home? yes Were any new equipment or medical supplies ordered?  No What is the name of the medical supply agency? N/a Were you able to get the supplies/equipment? not applicable Do you have any questions related to the use of the equipment or supplies? No  Functional Questionnaire: (I = Independent and D = Dependent) ADLs: i  Bathing/Dressing- i  Meal Prep- i  Eating- i  Maintaining continence- i  Transferring/Ambulation- i  Managing Meds- i  Follow up appointments reviewed:  PCP Hospital f/u appt confirmed? Yes  Patient has appt with PCP 10/20 at 12. [facility] f/u appt confirmed? Yes  Cardio FU 11/1 @ 12 Are transportation arrangements needed? No  If their condition worsens, is the pt aware to call PCP or go to the Emergency Dept.? Yes Was the patient provided with contact information for the PCP's office or ED? Yes Was to pt encouraged to call back with questions or concerns? Yes

## 2021-07-06 NOTE — Telephone Encounter (Signed)
Faxed signed order back to [facility] [phone], phone [phone]  Nursing for Disease symptom Management

## 2021-07-07 DIAGNOSIS — N2581 Secondary hyperparathyroidism of renal origin: Secondary | ICD-10-CM | POA: Diagnosis not present

## 2021-07-07 DIAGNOSIS — E876 Hypokalemia: Secondary | ICD-10-CM | POA: Diagnosis not present

## 2021-07-07 DIAGNOSIS — D509 Iron deficiency anemia, unspecified: Secondary | ICD-10-CM | POA: Diagnosis not present

## 2021-07-07 DIAGNOSIS — Z992 Dependence on renal dialysis: Secondary | ICD-10-CM | POA: Diagnosis not present

## 2021-07-07 DIAGNOSIS — N186 End stage renal disease: Secondary | ICD-10-CM | POA: Diagnosis not present

## 2021-07-10 DIAGNOSIS — E876 Hypokalemia: Secondary | ICD-10-CM | POA: Diagnosis not present

## 2021-07-10 DIAGNOSIS — N2581 Secondary hyperparathyroidism of renal origin: Secondary | ICD-10-CM | POA: Diagnosis not present

## 2021-07-10 DIAGNOSIS — Z992 Dependence on renal dialysis: Secondary | ICD-10-CM | POA: Diagnosis not present

## 2021-07-10 DIAGNOSIS — D509 Iron deficiency anemia, unspecified: Secondary | ICD-10-CM | POA: Diagnosis not present

## 2021-07-10 DIAGNOSIS — N186 End stage renal disease: Secondary | ICD-10-CM | POA: Diagnosis not present

## 2021-07-11 ENCOUNTER — Encounter: Payer: Self-pay | Admitting: Nurse Practitioner

## 2021-07-11 ENCOUNTER — Other Ambulatory Visit: Payer: Medicare Other | Admitting: Nurse Practitioner

## 2021-07-11 ENCOUNTER — Other Ambulatory Visit: Payer: Self-pay

## 2021-07-11 DIAGNOSIS — E1122 Type 2 diabetes mellitus with diabetic chronic kidney disease: Secondary | ICD-10-CM | POA: Diagnosis not present

## 2021-07-11 DIAGNOSIS — M109 Gout, unspecified: Secondary | ICD-10-CM | POA: Diagnosis not present

## 2021-07-11 DIAGNOSIS — E782 Mixed hyperlipidemia: Secondary | ICD-10-CM | POA: Diagnosis not present

## 2021-07-11 DIAGNOSIS — E039 Hypothyroidism, unspecified: Secondary | ICD-10-CM | POA: Diagnosis not present

## 2021-07-11 DIAGNOSIS — Z992 Dependence on renal dialysis: Secondary | ICD-10-CM | POA: Diagnosis not present

## 2021-07-11 DIAGNOSIS — K219 Gastro-esophageal reflux disease without esophagitis: Secondary | ICD-10-CM | POA: Diagnosis not present

## 2021-07-11 DIAGNOSIS — K921 Melena: Secondary | ICD-10-CM | POA: Diagnosis not present

## 2021-07-11 DIAGNOSIS — I480 Paroxysmal atrial fibrillation: Secondary | ICD-10-CM | POA: Diagnosis not present

## 2021-07-11 DIAGNOSIS — Z7982 Long term (current) use of aspirin: Secondary | ICD-10-CM | POA: Diagnosis not present

## 2021-07-11 DIAGNOSIS — Z515 Encounter for palliative care: Secondary | ICD-10-CM

## 2021-07-11 DIAGNOSIS — D500 Iron deficiency anemia secondary to blood loss (chronic): Secondary | ICD-10-CM | POA: Diagnosis not present

## 2021-07-11 DIAGNOSIS — R630 Anorexia: Secondary | ICD-10-CM

## 2021-07-11 DIAGNOSIS — F0390 Unspecified dementia without behavioral disturbance: Secondary | ICD-10-CM | POA: Diagnosis not present

## 2021-07-11 DIAGNOSIS — I120 Hypertensive chronic kidney disease with stage 5 chronic kidney disease or end stage renal disease: Secondary | ICD-10-CM | POA: Diagnosis not present

## 2021-07-11 DIAGNOSIS — N186 End stage renal disease: Secondary | ICD-10-CM | POA: Diagnosis not present

## 2021-07-11 NOTE — Progress Notes (Signed)
[facility] Palliative Care Consult Note Telephone: [phone]  Fax: [phone]    Date of encounter: 07/11/21 7:52 PM PATIENT NAME: [name] [address] [place] [address]   [phone] (home)  DOB: [birth year] MRN: [id number] PRIMARY CARE PROVIDER:    Elby Showers, MD,  [address] [place] [address] [phone]  RESPONSIBLE PARTY:    Contact Information     Name Relation Home Work Mobile   [name] Spouse [phone]  [phone]   [name] Daughter [phone]  [phone]   [name] Niece [phone]  [phone]      I met face to face with patient and family in home. Palliative Care was asked to follow this patient by consultation request of  [name], Cresenciano Lick, MD to address advance care planning and complex medical decision making. This is a follow up visit.                                   ASSESSMENT AND PLAN / RECOMMENDATIONS:  Symptom Management/Plan: 1. ACP; Discussed at length, DNR, scenarios of CPR, [name] endorses he would not want that happen to him, [name] in agreement. DNR goldenrod form completed, placed in vynca; we reviewed MOST form, wishes are for limited interventions, IVF, antibiotics and feeding tube for defined period of time. [name] endorses the feeding tube they will likely not do that but wants to have the option for further discussion should it present to be an option in the future. MOST form completed, placed in Vynca; Continue to treat what is treatable.    2. Anorexia; irritability; secondary to dementia; We talked about nutrition. We talked about supplements. We talked about disease progression of dementia in the setting of ESRD.   [name]; will send when needed to [facility].    3. Palliative care encounter; Palliative care encounter; Palliative medicine team will continue to support patient, patient's family, and medical team. Visit  consisted of counseling and education dealing with the complex and emotionally intense issues of symptom management and palliative care in the setting of serious and potentially life-threatening illness  Follow up Palliative Care Visit: Palliative care will continue to follow for complex medical decision making, advance care planning, and clarification of goals. Return 8 weeks or prn.  I spent 62 minutes providing this consultation. More than 50% of the time in this consultation was spent in counseling and care coordination.  PPS: 50%  Chief Complaint: Follow up palliative consult for complex medical decision making  HISTORY OF PRESENT ILLNESS:  YOSSEF [name] is a 80 y.o. year old male  with multiple medical problems including ESRD requiring HD, Alzheimer's disease, anemia, CAD, PVD, DM, HTN, HLD, sleep apnea, hypothyroidism, gerd, gout, arthritis, h/o hiatal hernia, h/o diverticulitis, ED, seasonal allergies, cataract, h/o burr holes, AV fistula placement, breast sgy left - granulomatous mastitis, aortobifemoral bypass, angioplasty. I called [name] to confirm [name] f/u visit and covid screening negative. I visited [name]. I called [name] to confirm [name] f/u visit and covid screening negative. I visited and observed [name] in his home. Johnny. [name] was lying in his recliner in his room. Johnny. [name] appears [name]. We talked about how Johnny. [name] has been feeling. [name] endorses he continues to have some right lower leg pain after 2 courses of abx; also lower abdomen. We talked about recent hospitalization for GI  bleed requiring 1unit pRBC's tranfused. We talked about f/u hospital visit with [name] in 2 days they will further discussion about right lower leg/lower abdomen. Johnny. [name] was cooperative with assessment. We talked about discomfort continued from d/c hospital, no changes in severity. We talked about appetite which continues to be declined. We talked about nutrition. We talked about no  recent falls, wounds. We talked about sleep, patterns. [name] does stay up late at night consistently. We talked about hemodialysis, [name] continues to take transportation to [name]. We talked about caregiver self care. Medical goals reviewed. No acute needs discussed today and will readdress with primary [name] in 2 days. [name] in agreement. We talked about role pc in poc. Therapeutic listening, emotional support provided. Questions answered.   History obtained from review of EMR, discussion with Johnny and  Johnny. [name].  I reviewed available labs, medications, imaging, studies and related documents from the EMR.  Records reviewed and summarized above.   ROS Full 10 system review of systems performed and negative with exception of: as per HPI.   Physical Exam: Constitutional: NAD General: frail appearing, thin, chronically ill pleasant male EYES: lids intact ENMT:oral mucous membranes moist CV: S1S2, RRR, no LE edema Pulmonary: LCTA, no increased work of breathing, no cough, room air Abdomen: normo-active BS + 4 quadrants, soft and non tender MSK: ambulatory Skin: warm and dry Neuro:  + generalized weakness,  + cognitive impairment Psych: non-anxious affect, A and O x 2 Questions and concerns were addressed. The patient/family was encouraged to call with questions and/or concerns. My contact information was provided. Provided general support and encouragement, no other unmet needs identified   Thank you for the opportunity to participate in the care of [name].  The palliative care team will continue to follow. Please call our office at [phone] if we can be of additional assistance.   This chart was dictated using voice recognition software.  Despite best efforts to proofread,  errors can occur which can change the documentation meaning.   Johnny Slagel Z Eldred Sooy, NP   COVID-19 PATIENT SCREENING TOOL Asked and negative response unless otherwise noted:   Have you had symptoms  of covid, tested positive or been in contact with someone with symptoms/positive test in the past 5-10 days?  NO

## 2021-07-12 DIAGNOSIS — N2581 Secondary hyperparathyroidism of renal origin: Secondary | ICD-10-CM | POA: Diagnosis not present

## 2021-07-12 DIAGNOSIS — E876 Hypokalemia: Secondary | ICD-10-CM | POA: Diagnosis not present

## 2021-07-12 DIAGNOSIS — E1129 Type 2 diabetes mellitus with other diabetic kidney complication: Secondary | ICD-10-CM | POA: Diagnosis not present

## 2021-07-12 DIAGNOSIS — D509 Iron deficiency anemia, unspecified: Secondary | ICD-10-CM | POA: Diagnosis not present

## 2021-07-12 DIAGNOSIS — Z992 Dependence on renal dialysis: Secondary | ICD-10-CM | POA: Diagnosis not present

## 2021-07-12 DIAGNOSIS — N186 End stage renal disease: Secondary | ICD-10-CM | POA: Diagnosis not present

## 2021-07-13 ENCOUNTER — Encounter: Payer: Self-pay | Admitting: Internal Medicine

## 2021-07-13 ENCOUNTER — Ambulatory Visit (INDEPENDENT_AMBULATORY_CARE_PROVIDER_SITE_OTHER): Payer: Medicare Other | Admitting: Internal Medicine

## 2021-07-13 ENCOUNTER — Other Ambulatory Visit: Payer: Self-pay

## 2021-07-13 VITALS — BP 138/54 | HR 64 | Temp 98.3°F

## 2021-07-13 DIAGNOSIS — K922 Gastrointestinal hemorrhage, unspecified: Secondary | ICD-10-CM

## 2021-07-13 DIAGNOSIS — I10 Essential (primary) hypertension: Secondary | ICD-10-CM

## 2021-07-13 DIAGNOSIS — K2991 Gastroduodenitis, unspecified, with bleeding: Secondary | ICD-10-CM

## 2021-07-13 DIAGNOSIS — M810 Age-related osteoporosis without current pathological fracture: Secondary | ICD-10-CM

## 2021-07-13 DIAGNOSIS — E538 Deficiency of other specified B group vitamins: Secondary | ICD-10-CM

## 2021-07-13 DIAGNOSIS — E039 Hypothyroidism, unspecified: Secondary | ICD-10-CM | POA: Diagnosis not present

## 2021-07-13 DIAGNOSIS — R718 Other abnormality of red blood cells: Secondary | ICD-10-CM | POA: Diagnosis not present

## 2021-07-13 DIAGNOSIS — E118 Type 2 diabetes mellitus with unspecified complications: Secondary | ICD-10-CM

## 2021-07-13 DIAGNOSIS — Z23 Encounter for immunization: Secondary | ICD-10-CM | POA: Diagnosis not present

## 2021-07-13 DIAGNOSIS — R6889 Other general symptoms and signs: Secondary | ICD-10-CM

## 2021-07-13 DIAGNOSIS — N186 End stage renal disease: Secondary | ICD-10-CM | POA: Diagnosis not present

## 2021-07-13 DIAGNOSIS — Z87820 Personal history of traumatic brain injury: Secondary | ICD-10-CM

## 2021-07-13 DIAGNOSIS — R413 Other amnesia: Secondary | ICD-10-CM

## 2021-07-13 DIAGNOSIS — Z992 Dependence on renal dialysis: Secondary | ICD-10-CM

## 2021-07-13 DIAGNOSIS — R54 Age-related physical debility: Secondary | ICD-10-CM

## 2021-07-13 DIAGNOSIS — I4891 Unspecified atrial fibrillation: Secondary | ICD-10-CM

## 2021-07-13 DIAGNOSIS — Z8739 Personal history of other diseases of the musculoskeletal system and connective tissue: Secondary | ICD-10-CM

## 2021-07-13 DIAGNOSIS — Z8719 Personal history of other diseases of the digestive system: Secondary | ICD-10-CM

## 2021-07-13 LAB — CBC WITH DIFFERENTIAL/PLATELET
Absolute Monocytes: 672 cells/uL (ref 200–950)
Basophils Absolute: 58 cells/uL (ref 0–200)
Basophils Relative: 0.8 %
Eosinophils Absolute: 292 cells/uL (ref 15–500)
Eosinophils Relative: 4 %
HCT: 36.8 % — ABNORMAL LOW (ref 38.5–50.0)
Hemoglobin: 11.7 g/dL — ABNORMAL LOW (ref 13.2–17.1)
Lymphs Abs: 1292 cells/uL (ref 850–3900)
MCH: 33.3 pg — ABNORMAL HIGH (ref 27.0–33.0)
MCHC: 31.8 g/dL — ABNORMAL LOW (ref 32.0–36.0)
MCV: 104.8 fL — ABNORMAL HIGH (ref 80.0–100.0)
MPV: 11.1 fL (ref 7.5–12.5)
Monocytes Relative: 9.2 %
Neutro Abs: 4986 cells/uL (ref 1500–7800)
Neutrophils Relative %: 68.3 %
Platelets: 147 10*3/uL (ref 140–400)
RBC: 3.51 10*6/uL — ABNORMAL LOW (ref 4.20–5.80)
RDW: 15.4 % — ABNORMAL HIGH (ref 11.0–15.0)
Total Lymphocyte: 17.7 %
WBC: 7.3 10*3/uL (ref 3.8–10.8)

## 2021-07-13 NOTE — Progress Notes (Signed)
Subjective:    Patient ID: [name], male    DOB: 04-Jun-1941, 80 y.o.   MRN: [id number]  HPI 80 year old Male seen for hospitalization follow up. Admitted recently October 10 - 13 with melena. Had GI bleed. Hgb was 7.9 grams on October 12.  He received 1 unit of packed red blood cells.  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  One was in the stomach, one was in the duodenum and tumor in the proximal jejunum.  He is now back home with home health care and feeling well.  His hemoglobin dropped to a low of 7.9 g on October 12 but improved to 9.8 g on October 13.  Today hemoglobin is 11.7 g and stable.  MCV is 104.8.  He has not been getting B12 immunizations at home.  He says he forgot about.  We will restart these.  He has a history of B12 deficiency.  He also has a history of memory loss which may mostly be due to dementia but B12 deficiency could be contributing to this as well.  He was seen for Medicare wellness visit in August 2022.  He has longstanding history of chronic kidney disease and is a dialysis patient.  He has insulin-dependent diabetes mellitus followed by Dr. Dwyane Dee, endocrinologist.  Mild memory loss is treated with Aricept.  He receives Prolia for osteoporosis.  History of hypothyroidism treated with thyroid replacement medication.  History of kyphoplasty L3 and L5 in the remote past secondary to a fall.  He had endplate disruption of L1 and L2 without frank compression in April 2019 due to a fall.  He receives home PT and has issues ambulating.  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  History of gout, hyperlipidemia, hypothyroidism, allergic rhinitis and peripheral vascular disease.  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  He had bilateral bur holes placed by Dr. Ellene Route and went to rehab for period of time.  This was related to a fall.  He had  fractured ribs.  He had issues with GI bleeding early October 2019.  History of herpes zoster 2007.  History of small bowel obstruction treated with surgical lysis of adhesions in 1996.  Admitted to the hospital February 2022 with right intertrochanteric femur fracture and went to rehab before getting back home.  Social history: He is married.  He used to work part-time at an [employer] on [address].  He is retired.  Also served in the [employer].  Wife works for the [employer], [employer].  1 adopted daughter.  Non-smoker.  Social alcohol consumption.  He is receiving palliative care services.  Family history: Father died of congestive heart failure at age 20.  Mother died at age 84 of a brain aneurysm.  He is an only child.  Apparently has had some episodes possibly of atrial fibrillation.  It was noticed in the recent hospitalization of October 2022.  He was sent to Cardiology but patient is not thought to be a candidate for anticoagulation due to history of GI bleeding.  It was felt that the episodes were more likely to be organized MAT rather than atrial fibrillation.  Review of Systems no new complaints.  He knows me.  He has some issues with memory loss and does not really know the day of the week except for.  He is cooperative.  Objective:   Physical Exam Neck is supple.  No carotid bruits.  Chest clear to auscultation.  Cardiac exam: Regular rate and rhythm without ectopy.  No lower extremity pitting edema.  Prostate is normal.  Stool is guaiac negative.       Assessment & Plan:  Anemia-hemoglobin  improved to 11.7 grams. MCV is 104.8 needs to be taking IM B12 monthly. B12 level and iron level not checked today due to miscommunication. Can be checked at dialysis or by Home Health.  Flu vaccine given today. He is perky today. Return as needed. Receives services from [place] and Palliative Care.  Status post recent GI bleed extensively evaluated in hospital with upper  endoscopy and small bowel endoscopy.  Stool was Hemoccult negative today.  Hemoglobin is stable.  B12 deficiency-starting B12 injections monthly once again.  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 Dwyane Dee.  Patient is on Hettinger hypertension-stable  Memory loss treated with Namenda and Aricept  Osteoporosis treated with Forteo by Dr. Dwyane Dee  Hypothyroidism treated with thyroid replacement medication  Hyperlipidemia treated with statin

## 2021-07-14 ENCOUNTER — Telehealth: Payer: Self-pay | Admitting: Internal Medicine

## 2021-07-14 DIAGNOSIS — K219 Gastro-esophageal reflux disease without esophagitis: Secondary | ICD-10-CM

## 2021-07-14 DIAGNOSIS — E039 Hypothyroidism, unspecified: Secondary | ICD-10-CM

## 2021-07-14 DIAGNOSIS — N2581 Secondary hyperparathyroidism of renal origin: Secondary | ICD-10-CM | POA: Diagnosis not present

## 2021-07-14 DIAGNOSIS — E876 Hypokalemia: Secondary | ICD-10-CM | POA: Diagnosis not present

## 2021-07-14 DIAGNOSIS — F0390 Unspecified dementia without behavioral disturbance: Secondary | ICD-10-CM

## 2021-07-14 DIAGNOSIS — D500 Iron deficiency anemia secondary to blood loss (chronic): Secondary | ICD-10-CM

## 2021-07-14 DIAGNOSIS — M109 Gout, unspecified: Secondary | ICD-10-CM

## 2021-07-14 DIAGNOSIS — I480 Paroxysmal atrial fibrillation: Secondary | ICD-10-CM

## 2021-07-14 DIAGNOSIS — D509 Iron deficiency anemia, unspecified: Secondary | ICD-10-CM | POA: Diagnosis not present

## 2021-07-14 DIAGNOSIS — N186 End stage renal disease: Secondary | ICD-10-CM

## 2021-07-14 DIAGNOSIS — I120 Hypertensive chronic kidney disease with stage 5 chronic kidney disease or end stage renal disease: Secondary | ICD-10-CM

## 2021-07-14 DIAGNOSIS — Z992 Dependence on renal dialysis: Secondary | ICD-10-CM

## 2021-07-14 DIAGNOSIS — E1122 Type 2 diabetes mellitus with diabetic chronic kidney disease: Secondary | ICD-10-CM

## 2021-07-14 DIAGNOSIS — E782 Mixed hyperlipidemia: Secondary | ICD-10-CM

## 2021-07-14 DIAGNOSIS — K921 Melena: Secondary | ICD-10-CM

## 2021-07-14 NOTE — Telephone Encounter (Signed)
Faxed signed certified orders to [facility] [phone], phone [phone]  Order # [id number]  Good thru 07/11/2021 to 09/08/2021

## 2021-07-17 DIAGNOSIS — N186 End stage renal disease: Secondary | ICD-10-CM | POA: Diagnosis not present

## 2021-07-17 DIAGNOSIS — N2581 Secondary hyperparathyroidism of renal origin: Secondary | ICD-10-CM | POA: Diagnosis not present

## 2021-07-17 DIAGNOSIS — Z992 Dependence on renal dialysis: Secondary | ICD-10-CM | POA: Diagnosis not present

## 2021-07-17 DIAGNOSIS — D509 Iron deficiency anemia, unspecified: Secondary | ICD-10-CM | POA: Diagnosis not present

## 2021-07-17 DIAGNOSIS — E876 Hypokalemia: Secondary | ICD-10-CM | POA: Diagnosis not present

## 2021-07-19 DIAGNOSIS — N2581 Secondary hyperparathyroidism of renal origin: Secondary | ICD-10-CM | POA: Diagnosis not present

## 2021-07-19 DIAGNOSIS — Z992 Dependence on renal dialysis: Secondary | ICD-10-CM | POA: Diagnosis not present

## 2021-07-19 DIAGNOSIS — N186 End stage renal disease: Secondary | ICD-10-CM | POA: Diagnosis not present

## 2021-07-19 DIAGNOSIS — D509 Iron deficiency anemia, unspecified: Secondary | ICD-10-CM | POA: Diagnosis not present

## 2021-07-19 DIAGNOSIS — E876 Hypokalemia: Secondary | ICD-10-CM | POA: Diagnosis not present

## 2021-07-20 DIAGNOSIS — E1122 Type 2 diabetes mellitus with diabetic chronic kidney disease: Secondary | ICD-10-CM | POA: Diagnosis not present

## 2021-07-20 DIAGNOSIS — K921 Melena: Secondary | ICD-10-CM | POA: Diagnosis not present

## 2021-07-20 DIAGNOSIS — I871 Compression of vein: Secondary | ICD-10-CM | POA: Diagnosis not present

## 2021-07-20 DIAGNOSIS — D500 Iron deficiency anemia secondary to blood loss (chronic): Secondary | ICD-10-CM | POA: Diagnosis not present

## 2021-07-20 DIAGNOSIS — Z992 Dependence on renal dialysis: Secondary | ICD-10-CM | POA: Diagnosis not present

## 2021-07-20 DIAGNOSIS — I120 Hypertensive chronic kidney disease with stage 5 chronic kidney disease or end stage renal disease: Secondary | ICD-10-CM | POA: Diagnosis not present

## 2021-07-20 DIAGNOSIS — F0390 Unspecified dementia without behavioral disturbance: Secondary | ICD-10-CM | POA: Diagnosis not present

## 2021-07-20 DIAGNOSIS — N186 End stage renal disease: Secondary | ICD-10-CM | POA: Diagnosis not present

## 2021-07-20 DIAGNOSIS — I480 Paroxysmal atrial fibrillation: Secondary | ICD-10-CM | POA: Diagnosis not present

## 2021-07-21 ENCOUNTER — Telehealth: Payer: Self-pay | Admitting: Internal Medicine

## 2021-07-21 DIAGNOSIS — E876 Hypokalemia: Secondary | ICD-10-CM | POA: Diagnosis not present

## 2021-07-21 DIAGNOSIS — N2581 Secondary hyperparathyroidism of renal origin: Secondary | ICD-10-CM | POA: Diagnosis not present

## 2021-07-21 DIAGNOSIS — Z992 Dependence on renal dialysis: Secondary | ICD-10-CM | POA: Diagnosis not present

## 2021-07-21 DIAGNOSIS — N186 End stage renal disease: Secondary | ICD-10-CM | POA: Diagnosis not present

## 2021-07-21 DIAGNOSIS — D509 Iron deficiency anemia, unspecified: Secondary | ICD-10-CM | POA: Diagnosis not present

## 2021-07-21 NOTE — Telephone Encounter (Signed)
Faxed signed clarification orders to [facility] [phone], phone [phone]  Date clarification 07/11/2021 to 09/08/2021

## 2021-07-23 NOTE — Progress Notes (Signed)
Cardiology Clinic Note   Patient Name: [name] Date of Encounter: 07/25/2021  Primary Care Provider:  Elby Showers, MD Primary Cardiologist:  Johnny Breeding, MD  Patient Profile    [name] 80 year old male presents to clinic today for follow-up evaluation of his atrial tachycardia  Past Medical History    Past Medical History:  Diagnosis Date   Allergy    Alzheimer's disease ([place]) 01/19/2021   Anemia    Arthritis    Cataract    bil cateracts removed   Coronary artery disease    Dementia arising in the senium and presenium ([place])    Diabetes mellitus    Type 2   Diverticulitis    ED (erectile dysfunction)    Elevated homocysteine    ESRD (end stage renal disease) on dialysis ([place]) 03/2015   M-W-F dialysis   GERD (gastroesophageal reflux disease)    pepto    Gout    Hiatal hernia    Hyperlipidemia    Hypertension    Hypothyroidism    PVD (peripheral vascular disease) ([place])    has plastic aorta   Renal insufficiency    Sleep apnea    does not wear c-pap   Past Surgical History:  Procedure Laterality Date   ANGIOPLASTY Right 01/26/2021   Procedure: ANGIOPLASTY RIGHT SUBCLAVIAN VEIN;  Surgeon: Johnny Mitchell, MD;  Location: [place];  Service: Vascular;  Laterality: Right;   aortobifemoral bypass     AV FISTULA PLACEMENT Left 12/01/2013   Procedure: ARTERIOVENOUS (AV) FISTULA CREATION- LEFT BRACHIOCEPHALIC;  Surgeon: Angelia Mould, MD;  Location: [place];  Service: Vascular;  Laterality: Left;   [place] Right 07/27/2014   Procedure: BASCILIC VEIN TRANSPOSITION;  Surgeon: Angelia Mould, MD;  Location: [place];  Service: Vascular;  Laterality: Right;   BRAIN SURGERY  07/22/2018   BREAST SURGERY     left - granulomatous mastitis   BURR HOLE Bilateral 07/22/2018   Procedure: BILATERAL BURR HOLES;  Surgeon: Johnny Miss, MD;  Location: [place];  Service: Neurosurgery;  Laterality: Bilateral;   COLONOSCOPY     ENDOV AAA REPR  W MDLR BIF PROSTH ([place] [facility])  1992   ENTEROSCOPY N/A 06/03/2018   Procedure: ENTEROSCOPY;  Surgeon: Johnny Bullion, DO;  Location: [facility];  Service: Gastroenterology;  Laterality: N/A;   ENTEROSCOPY N/A 11/20/2018   Procedure: ENTEROSCOPY;  Surgeon: Johnny Landmark Telford Nab., MD;  Location: [place];  Service: Gastroenterology;  Laterality: N/A;   ENTEROSCOPY N/A 12/03/2019   Procedure: ENTEROSCOPY;  Surgeon: Johnny Bullion, DO;  Location: [facility] ENDOSCOPY;  Service: Gastroenterology;  Laterality: N/A;  push enteroscopy   ENTEROSCOPY N/A 07/04/2021   Procedure: ENTEROSCOPY;  Surgeon: Johnny Creamer, MD;  Location: [facility] ENDOSCOPY;  Service: Gastroenterology;  Laterality: N/A;   EYE SURGERY Bilateral    cataracts   FISTULOGRAM Right 01/26/2021   Procedure: FISTULOGRAM RIGHT;  Surgeon: Johnny Mitchell, MD;  [facility];  Service: Vascular;  Laterality: Right;   HEMODIALYSIS INPATIENT  01/17/2018       HOT HEMOSTASIS N/A 06/03/2018   Procedure: HOT HEMOSTASIS (ARGON PLASMA COAGULATION/BICAP);  Surgeon: Johnny Bullion, DO;  Location: [facility] ENDOSCOPY;  Service: Gastroenterology;  Laterality: N/A;   HOT HEMOSTASIS N/A 11/20/2018   Procedure: HOT HEMOSTASIS (ARGON PLASMA COAGULATION/BICAP);  Surgeon: Johnny Copas., MD;  Location: [place];  Service: Gastroenterology;  Laterality: N/A;   HOT HEMOSTASIS N/A 12/03/2019   Procedure: HOT HEMOSTASIS (ARGON PLASMA COAGULATION/BICAP);  Surgeon: Johnny Bullion, DO;  [facility]OS[facility];  Service: Gastroenterology;  Laterality: N/A;   INTRAMEDULLARY (IM) NAIL INTERTROCHANTERIC Right 11/08/2020   Procedure: INTRAMEDULLARY (IM) NAIL INTERTROCHANTRIC;  Surgeon: Johnny Crocker, MD;  Location: [place];  Service: Orthopedics;  Laterality: Right;   REVISION OF ARTERIOVENOUS GORETEX GRAFT Right 09/01/2019   Procedure: REVISION OF ARTERIOVENOUS FISTULA RIGHT ARM;  Surgeon: Johnny Mitchell, MD;  Location: [place];  Service: Vascular;   Laterality: Right;   REVISON OF ARTERIOVENOUS FISTULA Left 02/09/2014   Procedure: REVISON OF LEFT ARTERIOVENOUS FISTULA - RESECTION OF RENDUNDANT VEIN;  Surgeon: Angelia Mould, MD;  Location: [place];  Service: Vascular;  Laterality: Left;   REVISON OF ARTERIOVENOUS FISTULA Right 01/26/2021   Procedure: REVISON OF ARTERIOVENOUS FISTULA RIGHT;  Surgeon: Johnny Mitchell, MD;  Location: [place];  Service: Vascular;  Laterality: Right;   SBO with lysis adhesions     SHUNTOGRAM Left 04/19/2014   Procedure: FISTULOGRAM;  Surgeon: Angelia Mould, MD;  Location: [facility] CATH LAB;  Service: Cardiovascular;  Laterality: Left;   UNILATERAL UPPER EXTREMEITY ANGIOGRAM N/A 07/12/2014   Procedure: UNILATERAL UPPER Anselmo Rod;  Surgeon: Angelia Mould, MD;  Location: [facility] CATH LAB;  Service: Cardiovascular;  Laterality: N/A;    Allergies  Allergies  Allergen Reactions   Ambien [Zolpidem Tartrate] Other (See Comments)    Hallucinations and "felt crazy"    Penicillins Rash and Hives    Has patient had a PCN reaction causing immediate rash, facial/tongue/throat swelling, SOB or lightheadedness with hypotension: Yes Has patient had a PCN reaction causing severe rash involving mucus membranes or skin necrosis: Yes Has patient had a PCN reaction that required hospitalization: No Has patient had a PCN reaction occurring within the last 10 years: No If all of the above answers are "NO", then may proceed with Cephalosporin use.  Other reaction(s): HIVES    History of Present Illness    [name] has a past medical history of atrial tachycardia and atrial fibrillation.  His PMH also includes essential hypertension, diabetes, end-stage renal disease.  He previously had bilateral subdural hematomas which required evacuation.  He is followed in the atrial fibrillation clinic.  He wore a cardiac event monitor in May which showed atrial tachycardia and showed no evidence of atrial  fibrillation.  He was admitted to the hospital 10/22 with abdominal pain and melena.  No acute findings were noted on CT abdomen.  He was slightly anemic.  His EGD showed no active bleeding.  He was felt to be in atrial fibrillation and he was referred to cardiology for evaluation.  His EKG showed atrial fibrillation with slightly irregular rhythm with no discernible P waves.  He was seen and evaluated by Dr. Percival Spanish on 07/05/2021.  He was noted to have dementia.  He was felt to possibly have some recall issues.  However, he was answering questions appropriately.  He denied chest discomfort, arm neck and back just comfort.  He denied shortness of breath, PND, and orthopnea.  He denied episodes of palpitations presyncope or syncope.  He was able to ambulate with his walker.  He reported a leg fracture with a previous fall.  Previous notes indicated he was on palliative care for anorexia and failure to thrive.  He was felt to have some brief episodes of atrial fibrillation.  It was felt that they were short and more likely to be organized MAT.  Due to his brief nature of the arrhythmia and  high risk for anticoagulation it was not recommended that he start [place].  He was also noted to have slightly elevated blood pressure.  He was on midodrine on dialysis days.  Dr. Percival Spanish recommended more aggressive BP control.  No changes in his therapy were made at that time.  He presents to the clinic today for follow-up evaluation states he feels well.  He presents with his daughter.  He reports well-controlled blood pressure and heart rate at home.  He denies further episodes of accelerated heartbeat.  We reviewed his recent hospitalization.  They expressed understanding.  We will continue with his current medication regimen.  We will plan as needed follow-up.  I will give him the salty 6 diet sheet information.  Today he denies chest pain, shortness of breath, lower extremity edema, fatigue, palpitations, melena,  hematuria, hemoptysis, diaphoresis, weakness, presyncope, syncope, orthopnea, and PND.  Home Medications    Prior to Admission medications   Medication Sig Start Date End Date Taking? Authorizing Provider  acetaminophen (TYLENOL) 325 MG tablet Take 1-2 tablets (325-650 mg total) by mouth every 4 (four) hours as needed for mild pain. 08/01/18   Love, Ivan Anchors, PA-C  albuterol (VENTOLIN HFA) 108 (90 Base) MCG/ACT inhaler Inhale 1 puff into the lungs daily as needed for wheezing or shortness of breath.    [provider]  allopurinol (ZYLOPRIM) 100 MG tablet Take 100 mg by mouth daily.    [provider]  Blood Glucose Monitoring Suppl (FREESTYLE FREEDOM LITE) w/Device KIT Use to check blood sugar 2 times per day dx code E11.65 10/21/18   Elayne Snare, MD  camphor-menthol [facility]) lotion Apply 1 application topically 4 (four) times daily as needed for itching.     [provider]  Carboxymethylcellulose Sodium (THERATEARS) 0.25 % SOLN Place 1 drop into both eyes as needed (for dryness).    [provider]  cetirizine (ZYRTEC) 10 MG chewable tablet Chew 10 mg by mouth every Monday, Wednesday, and Friday.    [provider]  clobetasol (TEMOVATE) 0.05 % external solution Apply 1 application topically 2 (two) times daily as needed (scalp irritation.).  04/19/15   [provider]  donepezil (ARICEPT) 10 MG tablet Take 1 tablet (10 mg total) by mouth at bedtime. 07/28/20   Melvenia Beam, MD  FLUOCINOLONE ACETONIDE SCALP 0.01 % OIL Apply 1 application topically daily as needed (scalp irritation.).  04/28/15   [provider]  fluticasone (FLONASE) 50 MCG/ACT nasal spray Place 1 spray into both nostrils daily. 10/08/14   Mikhail, Velta Addison, DO  glucose blood (FREESTYLE LITE) test strip USE AS DIRECTED THREE TIMES DAILY 01/16/21   Elayne Snare, MD  hydrocortisone 2.5 % cream Apply 1 application topically 3 (three) times daily as needed (skin irritation  (legs & arms)).  01/13/19   [provider]  Insulin Pen Needle (PEN NEEDLES) 32G X 4 MM MISC 1 each by Does not apply route 3 (three) times daily. USE PEN NEEDLES TO INJECT INSULIN THREE TIMES DAILY. 11/25/18   Elayne Snare, MD  ketoconazole (NIZORAL) 2 % shampoo Apply 1 application topically 2 (two) times a week. Patient taking differently: Apply 1 application topically 2 (two) times a week. Sundays, and any other day 03/31/15   Johnny Showers, MD  Lancets (FREESTYLE) lancets Use as instructed to check blood sugar 2 times per day dx code E11.65 02/22/16   Elayne Snare, MD  levothyroxine (SYNTHROID, LEVOTHROID) 50 MCG tablet Take 1 tablet (50 mcg total) by  mouth daily. Patient taking differently: Take 50 mcg by mouth daily before breakfast. 08/06/14   Baxley, Cresenciano Lick, MD  lidocaine (LIDODERM) 5 % Place 1 patch onto the skin daily as needed (back pain).  11/12/19   [provider]  memantine (NAMENDA) 10 MG tablet Take 1 tablet (10 mg total) by mouth 2 (two) times daily. 07/28/20   Melvenia Beam, MD  midodrine (PROAMATINE) 10 MG tablet Take one pill prior to hemodialysis on MWF Patient taking differently: Take 5 mg by mouth See admin instructions. Take one tablet by mouth prior to hemodialysis on MWF 08/13/18   Love, Ivan Anchors, PA-C  Nutritional Supplements (FEEDING SUPPLEMENT, NEPRO CARB STEADY,) LIQD Take 237 mLs by mouth in the morning and at bedtime.     [provider]  omeprazole (PRILOSEC) 40 MG capsule Take 1 capsule (40 mg total) by mouth daily. 03/13/18   Esterwood, Amy S, PA-C  sevelamer carbonate (RENVELA) 800 MG tablet Take 1,600-2,400 mg by mouth See admin instructions. Take 2,400 mg by mouth three times a day with meals and 1,600 mg with snacks on Sun/Tues/Thurs/Sat and 2,400 mg with breakfast and dinner and nothing with snacks on Mon/Wed/Fri    [provider]  simvastatin (ZOCOR) 20 MG tablet Take 20 mg by mouth at bedtime.    [provider]   Teriparatide, Recombinant, (FORTEO) 600 MCG/2.4ML SOPN Inject 20 mcg subcutaneously daily 03/25/21   Elayne Snare, MD  TRADJENTA 5 MG TABS tablet TAKE 1 TABLET DAILY Patient taking differently: Take 5 mg by mouth daily. 03/01/20   Elayne Snare, MD    Family History    Family History  Problem Relation Age of Onset   Aneurysm Mother        brain   Heart disease Father    Stroke Father    Hypertension Father    Diabetes Father    Dementia Neg Hx    Colon cancer Neg Hx    Esophageal cancer Neg Hx    Pancreatic cancer Neg Hx    Prostate cancer Neg Hx    Rectal cancer Neg Hx    Stomach cancer Neg Hx    He indicated that his mother is deceased. He indicated that his father is deceased. He indicated that his daughter is alive. He indicated that the status of his neg hx is unknown.  Social History    Social History   Socioeconomic History   Marital status: Married    Spouse name: [name] Mood   Number of children: 1   Years of education: 16   Highest education level: Not on file  Occupational History   Occupation: Retired  Tobacco Use   Smoking status: Former    Types: Cigarettes    Quit date: 09/24/1994    Years since quitting: 26.8   Smokeless tobacco: Never  Vaping Use   Vaping Use: Never used  Substance and Sexual Activity   Alcohol use: No    Alcohol/week: 0.0 standard drinks    Comment: Quit Oct. 1977 ("somewhat heavy")   Drug use: No   Sexual activity: Not on file  Other Topics Concern   Not on file  Social History Narrative   Lives at home with wife.   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  Cardiovascular:  No chest pain, dyspnea on exertion, edema, orthopnea, palpitations, paroxysmal nocturnal dyspnea. Dermatological: No rash, lesions/masses Respiratory: No cough, dyspnea Urologic: No hematuria, dysuria Abdominal:   No nausea, vomiting, diarrhea, bright red blood per rectum, melena, or hematemesis Neurologic:  No visual changes, wkns, changes in mental status. All other systems reviewed and are otherwise negative except as noted above.  Physical Exam    VS:  BP (!) 126/40 (BP Location: Left Arm)   Pulse (!) 58   Ht 5' 11"  (1.803 m)   Wt 143 lb (64.9 kg)   BMI 19.94 kg/m  , BMI Body mass index is 19.94 kg/m. GEN: Well nourished, well developed, in no acute distress. HEENT: normal. Neck: Supple, no JVD, carotid bruits, or masses. Cardiac: RRR, no murmurs, rubs, or gallops. No clubbing, cyanosis, edema.  Radials/DP/PT 2+ and equal bilaterally.  Respiratory:  Respirations regular and unlabored, clear to auscultation bilaterally. GI: Soft, nontender, nondistended, BS + x 4. MS: no deformity or atrophy. Skin: warm and dry, no rash. Neuro:  Strength and sensation are intact. Psych: Normal affect.  Accessory Clinical Findings    Recent Labs: 01/12/2021: B Natriuretic Peptide 329.7 07/04/2021: ALT 8; Magnesium 2.0 07/06/2021: BUN 22; Creatinine, Ser 6.55; Potassium 4.4; Sodium 138; TSH 4.198 07/13/2021: Hemoglobin 11.7; Platelets 147   Recent Lipid Panel    Component Value Date/Time   CHOL 81 05/09/2021 1147   TRIG 85.0 05/09/2021 1147   HDL 49.10 05/09/2021 1147   CHOLHDL 2 05/09/2021 1147   VLDL 17.0 05/09/2021 1147   LDLCALC 15 05/09/2021 1147   LDLCALC 44 04/02/2019 1148    ECG personally reviewed by me  today-none today.  Echocardiogram 11/07/2020  IMPRESSIONS     1. Left ventricular ejection fraction, by estimation, is 55 to 60%. The  left ventricle has normal function. The left ventricle has no regional  wall motion abnormalities. There is mild left ventricular hypertrophy.  Left ventricular diastolic parameters  are consistent with Grade I diastolic dysfunction (impaired relaxation).   2. Right ventricular systolic function is normal. The right ventricular  size is normal. There is mildly elevated pulmonary artery systolic  pressure. The estimated right ventricular systolic pressure is 88.2 mmHg.   3. The mitral valve is normal in structure. Trivial mitral valve  regurgitation. No evidence of mitral stenosis.   4. The aortic valve is tricuspid. Aortic valve regurgitation is not  visualized. Mild aortic valve sclerosis is present, with no evidence of  aortic valve stenosis.   5. The inferior vena cava is normal in size with greater than 50%  respiratory variability, suggesting right atrial pressure of 3 mmHg.   Assessment & Plan   1.  Cardiac arrhythmia-heart rate today 58 bpm.  Denies recent episodes of irregular or accelerated heartbeat.  Noted to have atrial tachycardia and short episodes of atrial fibrillation during recent admission.  Not a candidate for anticoagulation due to high risk and brief nature of episodes. Continue to monitor Heart healthy  low-sodium diet-salty 6 given Increase physical activity as tolerated  Essential hypertension-BP today 126/40.  Was noted to have elevated blood pressure while admitted/during hospitalization.  Patient requires midodrine for dialysis days.  ESRD. Maintain blood pressure log Recommend aggressive blood pressure control. Follows with PCP  End-stage renal disease-compliant with HD. Continue midodrine on dialysis days Follows with nephrology  Disposition: Follow-up with Dr. Percival Spanish or me as needed  Jossie Ng. Crytal Pensinger NP-C     07/25/2021, 10:40 AM [place] [address] Office [phone] Fax [phone]  Notice: This dictation was prepared with Dragon dictation along with smaller phrase technology. Any transcriptional errors that result from this process are unintentional and may not be corrected upon review.  I spent 14 minutes examining this patient, reviewing medications, and using patient centered shared decision making involving her cardiac care.  Prior to her visit I spent greater than 20 minutes reviewing her past medical history,  medications, and prior cardiac tests.

## 2021-07-24 DIAGNOSIS — N186 End stage renal disease: Secondary | ICD-10-CM | POA: Diagnosis not present

## 2021-07-24 DIAGNOSIS — Z992 Dependence on renal dialysis: Secondary | ICD-10-CM | POA: Diagnosis not present

## 2021-07-24 DIAGNOSIS — D509 Iron deficiency anemia, unspecified: Secondary | ICD-10-CM | POA: Diagnosis not present

## 2021-07-24 DIAGNOSIS — N2581 Secondary hyperparathyroidism of renal origin: Secondary | ICD-10-CM | POA: Diagnosis not present

## 2021-07-24 DIAGNOSIS — E876 Hypokalemia: Secondary | ICD-10-CM | POA: Diagnosis not present

## 2021-07-25 ENCOUNTER — Ambulatory Visit (INDEPENDENT_AMBULATORY_CARE_PROVIDER_SITE_OTHER): Payer: Medicare Other | Admitting: General Practice

## 2021-07-25 ENCOUNTER — Encounter: Payer: Self-pay | Admitting: General Practice

## 2021-07-25 ENCOUNTER — Other Ambulatory Visit: Payer: Self-pay

## 2021-07-25 VITALS — BP 126/40 | HR 58 | Ht 71.0 in | Wt 143.0 lb

## 2021-07-25 DIAGNOSIS — N2581 Secondary hyperparathyroidism of renal origin: Secondary | ICD-10-CM | POA: Diagnosis not present

## 2021-07-25 DIAGNOSIS — I120 Hypertensive chronic kidney disease with stage 5 chronic kidney disease or end stage renal disease: Secondary | ICD-10-CM | POA: Diagnosis not present

## 2021-07-25 DIAGNOSIS — I10 Essential (primary) hypertension: Secondary | ICD-10-CM | POA: Diagnosis not present

## 2021-07-25 DIAGNOSIS — T8249XD Other complication of vascular dialysis catheter, subsequent encounter: Secondary | ICD-10-CM | POA: Diagnosis not present

## 2021-07-25 DIAGNOSIS — N186 End stage renal disease: Secondary | ICD-10-CM | POA: Diagnosis not present

## 2021-07-25 DIAGNOSIS — I498 Other specified cardiac arrhythmias: Secondary | ICD-10-CM

## 2021-07-25 DIAGNOSIS — I480 Paroxysmal atrial fibrillation: Secondary | ICD-10-CM | POA: Diagnosis not present

## 2021-07-25 DIAGNOSIS — D631 Anemia in chronic kidney disease: Secondary | ICD-10-CM | POA: Diagnosis not present

## 2021-07-25 DIAGNOSIS — K921 Melena: Secondary | ICD-10-CM | POA: Diagnosis not present

## 2021-07-25 DIAGNOSIS — D500 Iron deficiency anemia secondary to blood loss (chronic): Secondary | ICD-10-CM | POA: Diagnosis not present

## 2021-07-25 DIAGNOSIS — F0390 Unspecified dementia without behavioral disturbance: Secondary | ICD-10-CM | POA: Diagnosis not present

## 2021-07-25 DIAGNOSIS — Z992 Dependence on renal dialysis: Secondary | ICD-10-CM | POA: Diagnosis not present

## 2021-07-25 DIAGNOSIS — E1122 Type 2 diabetes mellitus with diabetic chronic kidney disease: Secondary | ICD-10-CM | POA: Diagnosis not present

## 2021-07-25 NOTE — Patient Instructions (Signed)
Medication Instructions:  The current medical regimen is effective;  continue present plan and medications as directed. Please refer to the Current Medication list given to you today.   *If you need a refill on your cardiac medications before your next appointment, please call your pharmacy*  Lab Work:   Testing/Procedures:  NONE    NONE  Special Instructions PLEASE READ AND FOLLOW SALTY 6-ATTACHED-1,800mg  daily  PLEASE TAKE AND LOG YOUR BLOOD PRESSURE/HEART-RATE  PLEASE CALL IF YOU HAVE CHEST PAIN OR PALPITATIONS  Follow-Up: Your next appointment:  AS NEEDED  In Person with You may see Minus Breeding, Glade Stanford, FNP or  Sande Rives, PA-C  At [facility], you and your health needs are our priority.  As part of our continuing mission to provide you with exceptional heart care, we have created designated Provider Care Teams.  These Care Teams include your primary Cardiologist (physician) and Advanced Practice Providers (APPs -  Physician Assistants and Nurse Practitioners) who all work together to provide you with the care you need, when you need it.            6 SALTY THINGS TO AVOID     1,800MG  DAILY

## 2021-07-26 ENCOUNTER — Telehealth: Payer: Self-pay | Admitting: Internal Medicine

## 2021-07-26 DIAGNOSIS — D631 Anemia in chronic kidney disease: Secondary | ICD-10-CM | POA: Diagnosis not present

## 2021-07-26 DIAGNOSIS — N186 End stage renal disease: Secondary | ICD-10-CM | POA: Diagnosis not present

## 2021-07-26 DIAGNOSIS — T8249XD Other complication of vascular dialysis catheter, subsequent encounter: Secondary | ICD-10-CM | POA: Diagnosis not present

## 2021-07-26 DIAGNOSIS — N2581 Secondary hyperparathyroidism of renal origin: Secondary | ICD-10-CM | POA: Diagnosis not present

## 2021-07-26 DIAGNOSIS — E876 Hypokalemia: Secondary | ICD-10-CM | POA: Diagnosis not present

## 2021-07-26 DIAGNOSIS — D509 Iron deficiency anemia, unspecified: Secondary | ICD-10-CM | POA: Diagnosis not present

## 2021-07-26 DIAGNOSIS — E1129 Type 2 diabetes mellitus with other diabetic kidney complication: Secondary | ICD-10-CM | POA: Diagnosis not present

## 2021-07-26 DIAGNOSIS — Z992 Dependence on renal dialysis: Secondary | ICD-10-CM | POA: Diagnosis not present

## 2021-07-26 NOTE — Telephone Encounter (Addendum)
Received fax stating that patient missed home health visit .  Date missed 07/21/2021  [id number]

## 2021-07-27 DIAGNOSIS — D500 Iron deficiency anemia secondary to blood loss (chronic): Secondary | ICD-10-CM | POA: Diagnosis not present

## 2021-07-27 DIAGNOSIS — F0390 Unspecified dementia without behavioral disturbance: Secondary | ICD-10-CM | POA: Diagnosis not present

## 2021-07-27 DIAGNOSIS — I120 Hypertensive chronic kidney disease with stage 5 chronic kidney disease or end stage renal disease: Secondary | ICD-10-CM | POA: Diagnosis not present

## 2021-07-27 DIAGNOSIS — K921 Melena: Secondary | ICD-10-CM | POA: Diagnosis not present

## 2021-07-27 DIAGNOSIS — E1122 Type 2 diabetes mellitus with diabetic chronic kidney disease: Secondary | ICD-10-CM | POA: Diagnosis not present

## 2021-07-27 DIAGNOSIS — I480 Paroxysmal atrial fibrillation: Secondary | ICD-10-CM | POA: Diagnosis not present

## 2021-07-28 DIAGNOSIS — N2581 Secondary hyperparathyroidism of renal origin: Secondary | ICD-10-CM | POA: Diagnosis not present

## 2021-07-28 DIAGNOSIS — Z992 Dependence on renal dialysis: Secondary | ICD-10-CM | POA: Diagnosis not present

## 2021-07-28 DIAGNOSIS — D631 Anemia in chronic kidney disease: Secondary | ICD-10-CM | POA: Diagnosis not present

## 2021-07-28 DIAGNOSIS — T8249XD Other complication of vascular dialysis catheter, subsequent encounter: Secondary | ICD-10-CM | POA: Diagnosis not present

## 2021-07-28 DIAGNOSIS — N186 End stage renal disease: Secondary | ICD-10-CM | POA: Diagnosis not present

## 2021-07-31 DIAGNOSIS — N2581 Secondary hyperparathyroidism of renal origin: Secondary | ICD-10-CM | POA: Diagnosis not present

## 2021-07-31 DIAGNOSIS — N186 End stage renal disease: Secondary | ICD-10-CM | POA: Diagnosis not present

## 2021-07-31 DIAGNOSIS — Z992 Dependence on renal dialysis: Secondary | ICD-10-CM | POA: Diagnosis not present

## 2021-07-31 DIAGNOSIS — T8249XD Other complication of vascular dialysis catheter, subsequent encounter: Secondary | ICD-10-CM | POA: Diagnosis not present

## 2021-07-31 DIAGNOSIS — D631 Anemia in chronic kidney disease: Secondary | ICD-10-CM | POA: Diagnosis not present

## 2021-08-01 DIAGNOSIS — I120 Hypertensive chronic kidney disease with stage 5 chronic kidney disease or end stage renal disease: Secondary | ICD-10-CM | POA: Diagnosis not present

## 2021-08-01 DIAGNOSIS — E1122 Type 2 diabetes mellitus with diabetic chronic kidney disease: Secondary | ICD-10-CM | POA: Diagnosis not present

## 2021-08-01 DIAGNOSIS — D500 Iron deficiency anemia secondary to blood loss (chronic): Secondary | ICD-10-CM | POA: Diagnosis not present

## 2021-08-01 DIAGNOSIS — F0390 Unspecified dementia without behavioral disturbance: Secondary | ICD-10-CM | POA: Diagnosis not present

## 2021-08-01 DIAGNOSIS — K921 Melena: Secondary | ICD-10-CM | POA: Diagnosis not present

## 2021-08-01 DIAGNOSIS — I480 Paroxysmal atrial fibrillation: Secondary | ICD-10-CM | POA: Diagnosis not present

## 2021-08-02 DIAGNOSIS — T8249XD Other complication of vascular dialysis catheter, subsequent encounter: Secondary | ICD-10-CM | POA: Diagnosis not present

## 2021-08-02 DIAGNOSIS — N2581 Secondary hyperparathyroidism of renal origin: Secondary | ICD-10-CM | POA: Diagnosis not present

## 2021-08-02 DIAGNOSIS — N186 End stage renal disease: Secondary | ICD-10-CM | POA: Diagnosis not present

## 2021-08-02 DIAGNOSIS — D631 Anemia in chronic kidney disease: Secondary | ICD-10-CM | POA: Diagnosis not present

## 2021-08-02 DIAGNOSIS — Z992 Dependence on renal dialysis: Secondary | ICD-10-CM | POA: Diagnosis not present

## 2021-08-03 DIAGNOSIS — D500 Iron deficiency anemia secondary to blood loss (chronic): Secondary | ICD-10-CM | POA: Diagnosis not present

## 2021-08-03 DIAGNOSIS — K921 Melena: Secondary | ICD-10-CM | POA: Diagnosis not present

## 2021-08-03 DIAGNOSIS — I480 Paroxysmal atrial fibrillation: Secondary | ICD-10-CM | POA: Diagnosis not present

## 2021-08-03 DIAGNOSIS — E1122 Type 2 diabetes mellitus with diabetic chronic kidney disease: Secondary | ICD-10-CM | POA: Diagnosis not present

## 2021-08-03 DIAGNOSIS — F0390 Unspecified dementia without behavioral disturbance: Secondary | ICD-10-CM | POA: Diagnosis not present

## 2021-08-03 DIAGNOSIS — I120 Hypertensive chronic kidney disease with stage 5 chronic kidney disease or end stage renal disease: Secondary | ICD-10-CM | POA: Diagnosis not present

## 2021-08-04 DIAGNOSIS — N186 End stage renal disease: Secondary | ICD-10-CM | POA: Diagnosis not present

## 2021-08-04 DIAGNOSIS — Z992 Dependence on renal dialysis: Secondary | ICD-10-CM | POA: Diagnosis not present

## 2021-08-04 DIAGNOSIS — N2581 Secondary hyperparathyroidism of renal origin: Secondary | ICD-10-CM | POA: Diagnosis not present

## 2021-08-04 DIAGNOSIS — D631 Anemia in chronic kidney disease: Secondary | ICD-10-CM | POA: Diagnosis not present

## 2021-08-04 DIAGNOSIS — T8249XD Other complication of vascular dialysis catheter, subsequent encounter: Secondary | ICD-10-CM | POA: Diagnosis not present

## 2021-08-07 DIAGNOSIS — N2581 Secondary hyperparathyroidism of renal origin: Secondary | ICD-10-CM | POA: Diagnosis not present

## 2021-08-07 DIAGNOSIS — T8249XD Other complication of vascular dialysis catheter, subsequent encounter: Secondary | ICD-10-CM | POA: Diagnosis not present

## 2021-08-07 DIAGNOSIS — Z992 Dependence on renal dialysis: Secondary | ICD-10-CM | POA: Diagnosis not present

## 2021-08-07 DIAGNOSIS — D631 Anemia in chronic kidney disease: Secondary | ICD-10-CM | POA: Diagnosis not present

## 2021-08-07 DIAGNOSIS — N186 End stage renal disease: Secondary | ICD-10-CM | POA: Diagnosis not present

## 2021-08-09 DIAGNOSIS — Z992 Dependence on renal dialysis: Secondary | ICD-10-CM | POA: Diagnosis not present

## 2021-08-09 DIAGNOSIS — N186 End stage renal disease: Secondary | ICD-10-CM | POA: Diagnosis not present

## 2021-08-09 DIAGNOSIS — D631 Anemia in chronic kidney disease: Secondary | ICD-10-CM | POA: Diagnosis not present

## 2021-08-09 DIAGNOSIS — N2581 Secondary hyperparathyroidism of renal origin: Secondary | ICD-10-CM | POA: Diagnosis not present

## 2021-08-09 DIAGNOSIS — T8249XD Other complication of vascular dialysis catheter, subsequent encounter: Secondary | ICD-10-CM | POA: Diagnosis not present

## 2021-08-11 DIAGNOSIS — N186 End stage renal disease: Secondary | ICD-10-CM | POA: Diagnosis not present

## 2021-08-11 DIAGNOSIS — D631 Anemia in chronic kidney disease: Secondary | ICD-10-CM | POA: Diagnosis not present

## 2021-08-11 DIAGNOSIS — N2581 Secondary hyperparathyroidism of renal origin: Secondary | ICD-10-CM | POA: Diagnosis not present

## 2021-08-11 DIAGNOSIS — Z992 Dependence on renal dialysis: Secondary | ICD-10-CM | POA: Diagnosis not present

## 2021-08-11 DIAGNOSIS — T8249XD Other complication of vascular dialysis catheter, subsequent encounter: Secondary | ICD-10-CM | POA: Diagnosis not present

## 2021-08-14 DIAGNOSIS — D631 Anemia in chronic kidney disease: Secondary | ICD-10-CM | POA: Diagnosis not present

## 2021-08-14 DIAGNOSIS — Z992 Dependence on renal dialysis: Secondary | ICD-10-CM | POA: Diagnosis not present

## 2021-08-14 DIAGNOSIS — T8249XD Other complication of vascular dialysis catheter, subsequent encounter: Secondary | ICD-10-CM | POA: Diagnosis not present

## 2021-08-14 DIAGNOSIS — N2581 Secondary hyperparathyroidism of renal origin: Secondary | ICD-10-CM | POA: Diagnosis not present

## 2021-08-14 DIAGNOSIS — N186 End stage renal disease: Secondary | ICD-10-CM | POA: Diagnosis not present

## 2021-08-16 DIAGNOSIS — N186 End stage renal disease: Secondary | ICD-10-CM | POA: Diagnosis not present

## 2021-08-16 DIAGNOSIS — Z992 Dependence on renal dialysis: Secondary | ICD-10-CM | POA: Diagnosis not present

## 2021-08-16 DIAGNOSIS — T8249XD Other complication of vascular dialysis catheter, subsequent encounter: Secondary | ICD-10-CM | POA: Diagnosis not present

## 2021-08-16 DIAGNOSIS — D631 Anemia in chronic kidney disease: Secondary | ICD-10-CM | POA: Diagnosis not present

## 2021-08-16 DIAGNOSIS — N2581 Secondary hyperparathyroidism of renal origin: Secondary | ICD-10-CM | POA: Diagnosis not present

## 2021-08-19 DIAGNOSIS — Z992 Dependence on renal dialysis: Secondary | ICD-10-CM | POA: Diagnosis not present

## 2021-08-19 DIAGNOSIS — N186 End stage renal disease: Secondary | ICD-10-CM | POA: Diagnosis not present

## 2021-08-19 DIAGNOSIS — N2581 Secondary hyperparathyroidism of renal origin: Secondary | ICD-10-CM | POA: Diagnosis not present

## 2021-08-19 DIAGNOSIS — T8249XD Other complication of vascular dialysis catheter, subsequent encounter: Secondary | ICD-10-CM | POA: Diagnosis not present

## 2021-08-19 DIAGNOSIS — D631 Anemia in chronic kidney disease: Secondary | ICD-10-CM | POA: Diagnosis not present

## 2021-08-20 NOTE — Patient Instructions (Addendum)
Hemoglobin is stable status posthospitalization for GI bleed with transfusion.  Start B12 injections for elevated MCV and history of B12 deficiency.  Continue current medications.  Follow-up as needed.  Continue home health and palliative care services.  Flu vaccine given

## 2021-08-21 DIAGNOSIS — T8249XD Other complication of vascular dialysis catheter, subsequent encounter: Secondary | ICD-10-CM | POA: Diagnosis not present

## 2021-08-21 DIAGNOSIS — N186 End stage renal disease: Secondary | ICD-10-CM | POA: Diagnosis not present

## 2021-08-21 DIAGNOSIS — N2581 Secondary hyperparathyroidism of renal origin: Secondary | ICD-10-CM | POA: Diagnosis not present

## 2021-08-21 DIAGNOSIS — Z992 Dependence on renal dialysis: Secondary | ICD-10-CM | POA: Diagnosis not present

## 2021-08-21 DIAGNOSIS — D631 Anemia in chronic kidney disease: Secondary | ICD-10-CM | POA: Diagnosis not present

## 2021-08-23 DIAGNOSIS — N186 End stage renal disease: Secondary | ICD-10-CM | POA: Diagnosis not present

## 2021-08-23 DIAGNOSIS — N2581 Secondary hyperparathyroidism of renal origin: Secondary | ICD-10-CM | POA: Diagnosis not present

## 2021-08-23 DIAGNOSIS — Z992 Dependence on renal dialysis: Secondary | ICD-10-CM | POA: Diagnosis not present

## 2021-08-23 DIAGNOSIS — D631 Anemia in chronic kidney disease: Secondary | ICD-10-CM | POA: Diagnosis not present

## 2021-08-23 DIAGNOSIS — T8249XD Other complication of vascular dialysis catheter, subsequent encounter: Secondary | ICD-10-CM | POA: Diagnosis not present

## 2021-08-24 DIAGNOSIS — N186 End stage renal disease: Secondary | ICD-10-CM | POA: Diagnosis not present

## 2021-08-24 DIAGNOSIS — Z992 Dependence on renal dialysis: Secondary | ICD-10-CM | POA: Diagnosis not present

## 2021-08-24 DIAGNOSIS — E1122 Type 2 diabetes mellitus with diabetic chronic kidney disease: Secondary | ICD-10-CM | POA: Diagnosis not present

## 2021-08-25 DIAGNOSIS — N2581 Secondary hyperparathyroidism of renal origin: Secondary | ICD-10-CM | POA: Diagnosis not present

## 2021-08-25 DIAGNOSIS — Z992 Dependence on renal dialysis: Secondary | ICD-10-CM | POA: Diagnosis not present

## 2021-08-25 DIAGNOSIS — E1129 Type 2 diabetes mellitus with other diabetic kidney complication: Secondary | ICD-10-CM | POA: Diagnosis not present

## 2021-08-25 DIAGNOSIS — D631 Anemia in chronic kidney disease: Secondary | ICD-10-CM | POA: Diagnosis not present

## 2021-08-25 DIAGNOSIS — E876 Hypokalemia: Secondary | ICD-10-CM | POA: Diagnosis not present

## 2021-08-25 DIAGNOSIS — N186 End stage renal disease: Secondary | ICD-10-CM | POA: Diagnosis not present

## 2021-08-28 DIAGNOSIS — E876 Hypokalemia: Secondary | ICD-10-CM | POA: Diagnosis not present

## 2021-08-28 DIAGNOSIS — Z992 Dependence on renal dialysis: Secondary | ICD-10-CM | POA: Diagnosis not present

## 2021-08-28 DIAGNOSIS — N2581 Secondary hyperparathyroidism of renal origin: Secondary | ICD-10-CM | POA: Diagnosis not present

## 2021-08-28 DIAGNOSIS — D631 Anemia in chronic kidney disease: Secondary | ICD-10-CM | POA: Diagnosis not present

## 2021-08-28 DIAGNOSIS — N186 End stage renal disease: Secondary | ICD-10-CM | POA: Diagnosis not present

## 2021-08-30 DIAGNOSIS — D631 Anemia in chronic kidney disease: Secondary | ICD-10-CM | POA: Diagnosis not present

## 2021-08-30 DIAGNOSIS — Z992 Dependence on renal dialysis: Secondary | ICD-10-CM | POA: Diagnosis not present

## 2021-08-30 DIAGNOSIS — E876 Hypokalemia: Secondary | ICD-10-CM | POA: Diagnosis not present

## 2021-08-30 DIAGNOSIS — N2581 Secondary hyperparathyroidism of renal origin: Secondary | ICD-10-CM | POA: Diagnosis not present

## 2021-08-30 DIAGNOSIS — N186 End stage renal disease: Secondary | ICD-10-CM | POA: Diagnosis not present

## 2021-08-31 DIAGNOSIS — Z20822 Contact with and (suspected) exposure to covid-19: Secondary | ICD-10-CM | POA: Diagnosis not present

## 2021-09-01 DIAGNOSIS — Z992 Dependence on renal dialysis: Secondary | ICD-10-CM | POA: Diagnosis not present

## 2021-09-01 DIAGNOSIS — E876 Hypokalemia: Secondary | ICD-10-CM | POA: Diagnosis not present

## 2021-09-01 DIAGNOSIS — N2581 Secondary hyperparathyroidism of renal origin: Secondary | ICD-10-CM | POA: Diagnosis not present

## 2021-09-01 DIAGNOSIS — N186 End stage renal disease: Secondary | ICD-10-CM | POA: Diagnosis not present

## 2021-09-01 DIAGNOSIS — D631 Anemia in chronic kidney disease: Secondary | ICD-10-CM | POA: Diagnosis not present

## 2021-09-04 DIAGNOSIS — E876 Hypokalemia: Secondary | ICD-10-CM | POA: Diagnosis not present

## 2021-09-04 DIAGNOSIS — Z992 Dependence on renal dialysis: Secondary | ICD-10-CM | POA: Diagnosis not present

## 2021-09-04 DIAGNOSIS — N186 End stage renal disease: Secondary | ICD-10-CM | POA: Diagnosis not present

## 2021-09-04 DIAGNOSIS — D631 Anemia in chronic kidney disease: Secondary | ICD-10-CM | POA: Diagnosis not present

## 2021-09-04 DIAGNOSIS — N2581 Secondary hyperparathyroidism of renal origin: Secondary | ICD-10-CM | POA: Diagnosis not present

## 2021-09-05 ENCOUNTER — Other Ambulatory Visit: Payer: Self-pay

## 2021-09-05 ENCOUNTER — Other Ambulatory Visit (INDEPENDENT_AMBULATORY_CARE_PROVIDER_SITE_OTHER): Payer: Medicare Other

## 2021-09-05 DIAGNOSIS — M8000XD Age-related osteoporosis with current pathological fracture, unspecified site, subsequent encounter for fracture with routine healing: Secondary | ICD-10-CM | POA: Diagnosis not present

## 2021-09-05 DIAGNOSIS — E1165 Type 2 diabetes mellitus with hyperglycemia: Secondary | ICD-10-CM

## 2021-09-05 LAB — GLUCOSE, RANDOM: Glucose, Bld: 67 mg/dL — ABNORMAL LOW (ref 70–99)

## 2021-09-05 LAB — CALCIUM: Calcium: 9.4 mg/dL (ref 8.4–10.5)

## 2021-09-06 ENCOUNTER — Telehealth: Payer: Self-pay | Admitting: *Deleted

## 2021-09-06 DIAGNOSIS — N2581 Secondary hyperparathyroidism of renal origin: Secondary | ICD-10-CM | POA: Diagnosis not present

## 2021-09-06 DIAGNOSIS — D631 Anemia in chronic kidney disease: Secondary | ICD-10-CM | POA: Diagnosis not present

## 2021-09-06 DIAGNOSIS — Z992 Dependence on renal dialysis: Secondary | ICD-10-CM | POA: Diagnosis not present

## 2021-09-06 DIAGNOSIS — N186 End stage renal disease: Secondary | ICD-10-CM | POA: Diagnosis not present

## 2021-09-06 DIAGNOSIS — E876 Hypokalemia: Secondary | ICD-10-CM | POA: Diagnosis not present

## 2021-09-06 LAB — FRUCTOSAMINE: Fructosamine: 329 umol/L — ABNORMAL HIGH (ref 0–285)

## 2021-09-06 NOTE — Telephone Encounter (Signed)
Received request for notes relating to donepezil and memantine.  I faxed to [phone] with confirmation.

## 2021-09-07 ENCOUNTER — Ambulatory Visit (INDEPENDENT_AMBULATORY_CARE_PROVIDER_SITE_OTHER): Payer: Medicare Other | Admitting: Endocrinology

## 2021-09-07 ENCOUNTER — Other Ambulatory Visit: Payer: Self-pay

## 2021-09-07 ENCOUNTER — Encounter: Payer: Self-pay | Admitting: Endocrinology

## 2021-09-07 VITALS — BP 156/60 | HR 62 | Ht 71.0 in | Wt 147.0 lb

## 2021-09-07 DIAGNOSIS — E1165 Type 2 diabetes mellitus with hyperglycemia: Secondary | ICD-10-CM | POA: Diagnosis not present

## 2021-09-07 DIAGNOSIS — M8000XD Age-related osteoporosis with current pathological fracture, unspecified site, subsequent encounter for fracture with routine healing: Secondary | ICD-10-CM | POA: Diagnosis not present

## 2021-09-07 NOTE — Patient Instructions (Addendum)
Check blood sugars on waking up 1-2 days a week  Also check blood sugars about 2 hours after meals and do this after different meals by rotation  Recommended blood sugar levels on waking up are 90-130 and about 2 hours after meal is 130-160  Please bring your blood sugar monitor to each visit, thank you  Sugar free candy or sweets

## 2021-09-07 NOTE — Progress Notes (Signed)
Patient ID: [name], male   DOB: 22-Jan-1941, 80 y.o.   MRN: [id number]           Reason for Appointment:  Follow-up for endocrinology problems   History of Present Illness:          Date of diagnosis of type 2 diabetes mellitus:  1996      Background history:  He has had long-standing diabetes probably treated with metformin initially and subsequently with sulfonylurea drugs At some point he was also given Actos in addition to his glipizide which he is still taking Records of his control are only available for the last 4 years not on Over the last few years his A1c has been generally in the 6-7% range His A1c was 6.2 in May 2016 and apparently was starting to get low normal blood sugars at times He was admitted to the hospital for fluid overload and renal failure and at that time his Actos was stopped Subsequently his blood sugars had been markedly increased He was switched from regular insulin to NovoLog on his initial consultation  Recent history:   INSULIN regimen: None, previously on NovoLog 6 units with breakfast and usually 6 units before dinner  Oral hypoglycemic drugs:  Tradjenta 2.5 mg as needed  His A1c tends to be falsely low, last 4.6  Fructosamine in relatively higher at 329 compared to 306   Current blood sugar patterns and problems identified: His blood sugars appear to be significantly higher compared to his last visit in August  However his wife is having him check his blood sugars only before dinner  He thinks that his blood sugars are higher on the days of his dialysis because he is eating snacks and candy like lollipops while at dialysis  Also tends to eat more sweets  Since his diet is variable blood sugars may be occasionally over 200 lunch or before dinner His weight is about the same  His wife gives him 1/2 tablet Tradjenta when his blood sugars are over 200 but not regularly Not on insulin  Fructosamine is relatively higher Again not able to do  much physical activity because of weakness   Dialysis is done at 12 pm, doing it on Monday/Wednesday/Fridays     Side effects from medications have been: None  Compliance with the medical regimen: Fair  Glucose monitoring:  done up to 3  times a day         Glucometer:  Freestyle  Blood Glucose readings by review of monitor Download and averages:   PRE-MEAL Fasting Lunch Dinner Bedtime Overall  Glucose range:  107 74-273    Mean/median:   177  174   POST-MEAL PC Breakfast PC Lunch PC Dinner  Glucose range:   ?  Mean/median:       PREVIOUS FASTING range 70-91 and nonfasting 74-177 AVERAGE overall 102   Self-care: The diet that the patient has been following is: tries to limit sweets, portions. eating out only about once a week at a steak house        Typical meal intake: Breakfast is variable, ham biscuit; supper 7-8 pm            Dietician visit, most recent: 08/2017   Weight history:   Wt Readings from Last 3 Encounters:  09/07/21 147 lb (66.7 kg)  07/25/21 143 lb (64.9 kg)  07/05/21 157 lb 10.1 oz (71.5 kg)     Glycemic control:    Lab Results  Component Value Date  HGBA1C 4.6 (L) 07/04/2021   HGBA1C 5.4 05/11/2021   HGBA1C 4.3 (L) 02/02/2021   Lab Results  Component Value Date   MICROALBUR 37.2 09/03/2016   LDLCALC 15 05/09/2021   CREATININE 6.55 (H) 07/06/2021    Lab Results  Component Value Date   FRUCTOSAMINE 329 (H) 09/05/2021   FRUCTOSAMINE 306 (H) 05/09/2021   FRUCTOSAMINE 277 02/02/2021    PROBLEM 2 Osteoporosis: See review of systems     Allergies as of 09/07/2021       Reactions   Ambien [zolpidem Tartrate] Other (See Comments)   Hallucinations and "felt crazy"    Penicillins Rash, Hives   Has patient had a PCN reaction causing immediate rash, facial/tongue/throat swelling, SOB or lightheadedness with hypotension: Yes Has patient had a PCN reaction causing severe rash involving mucus membranes or skin necrosis: Yes Has patient  had a PCN reaction that required hospitalization: No Has patient had a PCN reaction occurring within the last 10 years: No If all of the above answers are "NO", then may proceed with Cephalosporin use. Other reaction(s): HIVES        Medication List        Accurate as of September 07, 2021  4:26 PM. If you have any questions, ask your nurse or doctor.          acetaminophen 325 MG tablet Commonly known as: TYLENOL Take 1-2 tablets (325-650 mg total) by mouth every 4 (four) hours as needed for mild pain.   albuterol 108 (90 Base) MCG/ACT inhaler Commonly known as: VENTOLIN HFA Inhale 1 puff into the lungs daily as needed for wheezing or shortness of breath.   allopurinol 100 MG tablet Commonly known as: ZYLOPRIM Take 100 mg by mouth daily.   camphor-menthol lotion Commonly known as: SARNA Apply 1 application topically 4 (four) times daily as needed for itching.   cetirizine 10 MG chewable tablet Commonly known as: ZYRTEC Chew 10 mg by mouth every Monday, Wednesday, and Friday.   clobetasol 0.05 % external solution Commonly known as: TEMOVATE Apply 1 application topically 2 (two) times daily as needed (scalp irritation.).   donepezil 10 MG tablet Commonly known as: ARICEPT Take 1 tablet (10 mg total) by mouth at bedtime.   feeding supplement (NEPRO CARB STEADY) Liqd Take 237 mLs by mouth in the morning and at bedtime.   Fluocinolone Acetonide Scalp 0.01 % Oil Apply 1 application topically daily as needed (scalp irritation.).   fluticasone 50 MCG/ACT nasal spray Commonly known as: FLONASE Place 1 spray into both nostrils daily.   Forteo 600 MCG/2.4ML Sopn Generic drug: Teriparatide (Recombinant) Inject 20 mcg subcutaneously daily   FreeStyle Freedom Lite w/Device Kit Use to check blood sugar 2 times per day dx code E11.65   freestyle lancets Use as instructed to check blood sugar 2 times per day dx code E11.65   FREESTYLE LITE test strip Generic drug:  glucose blood USE AS DIRECTED THREE TIMES DAILY   hydrocortisone 2.5 % cream Apply 1 application topically 3 (three) times daily as needed (skin irritation (legs & arms)).   ketoconazole 2 % shampoo Commonly known as: Nizoral Apply 1 application topically 2 (two) times a week. What changed: additional instructions   levothyroxine 50 MCG tablet Commonly known as: SYNTHROID Take 1 tablet (50 mcg total) by mouth daily. What changed: when to take this   lidocaine 5 % Commonly known as: LIDODERM Place 1 patch onto the skin daily as needed (back pain).   memantine 10 MG  tablet Commonly known as: NAMENDA Take 1 tablet (10 mg total) by mouth 2 (two) times daily.   midodrine 10 MG tablet Commonly known as: PROAMATINE Take one pill prior to hemodialysis on MWF What changed:  how much to take how to take this when to take this additional instructions   omeprazole 40 MG capsule Commonly known as: PRILOSEC Take 1 capsule (40 mg total) by mouth daily.   Pen Needles 32G X 4 MM Misc 1 each by Does not apply route 3 (three) times daily. USE PEN NEEDLES TO INJECT INSULIN THREE TIMES DAILY.   sevelamer carbonate 800 MG tablet Commonly known as: RENVELA Take 1,600-2,400 mg by mouth See admin instructions. Take 2,400 mg by mouth three times a day with meals and 1,600 mg with snacks on Sun/Tues/Thurs/Sat and 2,400 mg with breakfast and dinner and nothing with snacks on Mon/Wed/Fri   simvastatin 20 MG tablet Commonly known as: ZOCOR Take 20 mg by mouth at bedtime.   Theratears 0.25 % Soln Generic drug: Carboxymethylcellulose Sodium Place 1 drop into both eyes as needed (for dryness).   Tradjenta 5 MG Tabs tablet Generic drug: linagliptin TAKE 1 TABLET DAILY        Allergies:  Allergies  Allergen Reactions   Ambien [Zolpidem Tartrate] Other (See Comments)    Hallucinations and "felt crazy"    Penicillins Rash and Hives    Has patient had a PCN reaction causing immediate  rash, facial/tongue/throat swelling, SOB or lightheadedness with hypotension: Yes Has patient had a PCN reaction causing severe rash involving mucus membranes or skin necrosis: Yes Has patient had a PCN reaction that required hospitalization: No Has patient had a PCN reaction occurring within the last 10 years: No If all of the above answers are "NO", then may proceed with Cephalosporin use.  Other reaction(s): HIVES    Past Medical History:  Diagnosis Date   Allergy    Alzheimer's disease ([place]) 01/19/2021   Anemia    Arthritis    Cataract    bil cateracts removed   Coronary artery disease    Dementia arising in the senium and presenium ([place])    Diabetes mellitus    Type 2   Diverticulitis    ED (erectile dysfunction)    Elevated homocysteine    ESRD (end stage renal disease) on dialysis ([place]) 03/2015   M-W-F dialysis   GERD (gastroesophageal reflux disease)    pepto    Gout    Hiatal hernia    Hyperlipidemia    Hypertension    Hypothyroidism    PVD (peripheral vascular disease) ([place])    has plastic aorta   Renal insufficiency    Sleep apnea    does not wear c-pap    Past Surgical History:  Procedure Laterality Date   ANGIOPLASTY Right 01/26/2021   Procedure: ANGIOPLASTY RIGHT SUBCLAVIAN VEIN;  Surgeon: Serafina Mitchell, MD;  Location: [facility];  Service: Vascular;  Laterality: Right;   aortobifemoral bypass     AV FISTULA PLACEMENT Left 12/01/2013   Procedure: ARTERIOVENOUS (AV) FISTULA CREATION- LEFT BRACHIOCEPHALIC;  Surgeon: Angelia Mould, MD;  Location: [facility];  Service: Vascular;  Laterality: Left;   [place] Right 07/27/2014   Procedure: BASCILIC VEIN TRANSPOSITION;  Surgeon: Angelia Mould, MD;  Location: [facility];  Service: Vascular;  Laterality: Right;   BRAIN SURGERY  07/22/2018   BREAST SURGERY     left - granulomatous mastitis   BURR HOLE Bilateral 07/22/2018   Procedure: BILATERAL  Haskell Flirt;  Surgeon: Kristeen Miss, MD;   Location: [facility];  Service: Neurosurgery;  Laterality: Bilateral;   COLONOSCOPY     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 ([place] [facility])  1992   ENTEROSCOPY N/A 06/03/2018   Procedure: ENTEROSCOPY;  Surgeon: Lavena Bullion, DO;  Location: [facility];  Service: Gastroenterology;  Laterality: N/A;   ENTEROSCOPY N/A 11/20/2018   Procedure: ENTEROSCOPY;  Surgeon: Rush Landmark Telford Nab., MD;  Location: [facility];  Service: Gastroenterology;  Laterality: N/A;   ENTEROSCOPY N/A 12/03/2019   Procedure: ENTEROSCOPY;  Surgeon: Lavena Bullion, DO;  Location: [facility];  Service: Gastroenterology;  Laterality: N/A;  push enteroscopy   ENTEROSCOPY N/A 07/04/2021   Procedure: ENTEROSCOPY;  Surgeon: Sharyn Creamer, MD;  Location: [facility];  Service: Gastroenterology;  Laterality: N/A;   EYE SURGERY Bilateral    cataracts   FISTULOGRAM Right 01/26/2021   Procedure: FISTULOGRAM RIGHT;  Surgeon: Serafina Mitchell, MD;  Location: [facility] OR;  Service: Vascular;  Laterality: Right;   HEMODIALYSIS INPATIENT  01/17/2018       HOT HEMOSTASIS N/A 06/03/2018   Procedure: HOT HEMOSTASIS (ARGON PLASMA COAGULATION/BICAP);  Surgeon: Lavena Bullion, DO;  Location: [facility] ENDOSCOPY;  Service: Gastroenterology;  Laterality: N/A;   HOT HEMOSTASIS N/A 11/20/2018   Procedure: HOT HEMOSTASIS (ARGON PLASMA COAGULATION/BICAP);  Surgeon: Irving Copas., MD;  Location: [facility];  Service: Gastroenterology;  Laterality: N/A;   HOT HEMOSTASIS N/A 12/03/2019   Procedure: HOT HEMOSTASIS (ARGON PLASMA COAGULATION/BICAP);  Surgeon: Lavena Bullion, DO;  Location: [facility];  Service: Gastroenterology;  Laterality: N/A;   INTRAMEDULLARY (IM) NAIL INTERTROCHANTERIC Right 11/08/2020   Procedure: INTRAMEDULLARY (IM) NAIL INTERTROCHANTRIC;  Surgeon: Erle Crocker, MD;  Location: [facility];  Service: Orthopedics;  Laterality: Right;   REVISION OF ARTERIOVENOUS GORETEX GRAFT Right 09/01/2019   Procedure: REVISION OF  ARTERIOVENOUS FISTULA RIGHT ARM;  Surgeon: Serafina Mitchell, MD;  Location: [facility];  Service: Vascular;  Laterality: Right;   REVISON OF ARTERIOVENOUS FISTULA Left 02/09/2014   Procedure: REVISON OF LEFT ARTERIOVENOUS FISTULA - RESECTION OF RENDUNDANT VEIN;  Surgeon: Angelia Mould, MD;  Location: [facility];  Service: Vascular;  Laterality: Left;   REVISON OF ARTERIOVENOUS FISTULA Right 01/26/2021   Procedure: REVISON OF ARTERIOVENOUS FISTULA RIGHT;  Surgeon: Serafina Mitchell, MD;  Location: [facility];  Service: Vascular;  Laterality: Right;   SBO with lysis adhesions     SHUNTOGRAM Left 04/19/2014   Procedure: FISTULOGRAM;  Surgeon: Angelia Mould, MD;  Location: [facility] CATH LAB;  Service: Cardiovascular;  Laterality: Left;   UNILATERAL UPPER EXTREMEITY ANGIOGRAM N/A 07/12/2014   Procedure: UNILATERAL UPPER Anselmo Rod;  Surgeon: Angelia Mould, MD;  Location: [facility] CATH LAB;  Service: Cardiovascular;  Laterality: N/A;    Family History  Problem Relation Age of Onset   Aneurysm Mother        brain   Heart disease Father    Stroke Father    Hypertension Father    Diabetes Father    Dementia Neg Hx    Colon cancer Neg Hx    Esophageal cancer Neg Hx    Pancreatic cancer Neg Hx    Prostate cancer Neg Hx    Rectal cancer Neg Hx    Stomach cancer Neg Hx     Social History:  reports that he quit smoking about 26 years ago. His smoking use included cigarettes. He has never used smokeless tobacco. He reports that he does not drink  alcohol and does not use drugs.    Review of Systems      OSTEOPOROSIS:  He has had a history of 2 vertebral fractures He had rib fractures after his fall in October 2019 His T score at the radius was -4.6 and unable to read his hips and spine on the bone densitometry  He had been on Prolia since 5/19 and last injection was in 05/19/20  After discussion with nephrologist even though he has mild secondary hyperparathyroidism he has been on [place]  since 7/22 He is injecting it daily without side effects  Calcium has been variable, also followed by nephrologist Recently normal consistently  PTH values not available from nephrology office  Lab Results  Component Value Date   PTH 154 (H) 10/06/2014   CALCIUM 9.4 09/05/2021   CAION 1.11 (L) 01/26/2021   PHOS 4.1 07/06/2021     Lipid history: He has been treated with Simvastatin 20 mg  by PCP, results as follows    Lab Results  Component Value Date   CHOL 81 05/09/2021   HDL 49.10 05/09/2021   [place] 15 05/09/2021   TRIG 85.0 05/09/2021   CHOLHDL 2 05/09/2021          He has ESRD on dialysis    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  Is on 50 mcg levothyroxine with normal TSH levels, this has been prescribed by PCP  Lab Results  Component Value Date   TSH 4.198 07/06/2021      Physical Examination:  BP (!) 156/60 (BP Location: Left Arm, Patient Position: Sitting, Cuff Size: Normal)    Pulse 62    Ht 5' 11" (1.803 m)    Wt 147 lb (66.7 kg)    SpO2 99%    BMI 20.50 kg/m      ASSESSMENT:  Diabetes type 2  See history of present illness for discussion of his current management, blood sugar patterns and problems identified   His fructosamine is 306 compared to 277, A1c has been falsely low probably from anemia or renal insufficiency  His blood sugars are significantly higher averaging about 175 before dinnertime although do not appear to be high fasting This is partly related to his inconsistent diet as discussed above Not clear if he is insulin deficient at this time   OSTEOPOROSIS with history of fracture: Taking Forteo daily without side effects since 7/22 and he will continue this for total of 18 months Calcium has been stable with this  PLAN:   Diet needs to be modified with cutting back on carbohydrates, high sugar content foods and balanced meals He will switch to mostly  sugar-free snacks and desserts Recommended seeing the dietitian but his wife is not sure if she wants to do this  To modify blood sugar monitoring pattern and check some readings 2 hours after meals at different times For now we will start Tradjenta 5 mg daily but if he has consistently high readings after lunch or dinner will consider adding NovoLog again Discussed postprandial targets of 180 or less Okay to continue Glucerna but will also need to check blood sugars after breakfast  Will continue to follow fructosamine levels and A1c is not accurate   There are no Patient Instructions on file for this visit.     Elayne Snare 09/07/2021, 4:26 PM   Note: This office note was prepared with Dragon voice recognition system technology. Any transcriptional errors that result from this  process are unintentional.

## 2021-09-08 DIAGNOSIS — E876 Hypokalemia: Secondary | ICD-10-CM | POA: Diagnosis not present

## 2021-09-08 DIAGNOSIS — N2581 Secondary hyperparathyroidism of renal origin: Secondary | ICD-10-CM | POA: Diagnosis not present

## 2021-09-08 DIAGNOSIS — N186 End stage renal disease: Secondary | ICD-10-CM | POA: Diagnosis not present

## 2021-09-08 DIAGNOSIS — Z992 Dependence on renal dialysis: Secondary | ICD-10-CM | POA: Diagnosis not present

## 2021-09-08 DIAGNOSIS — D631 Anemia in chronic kidney disease: Secondary | ICD-10-CM | POA: Diagnosis not present

## 2021-09-08 MED ORDER — FORTEO 600 MCG/2.4ML ~~LOC~~ SOPN: PEN_INJECTOR | SUBCUTANEOUS | 1 refills | Status: DC

## 2021-09-11 DIAGNOSIS — Z992 Dependence on renal dialysis: Secondary | ICD-10-CM | POA: Diagnosis not present

## 2021-09-11 DIAGNOSIS — N186 End stage renal disease: Secondary | ICD-10-CM | POA: Diagnosis not present

## 2021-09-11 DIAGNOSIS — E876 Hypokalemia: Secondary | ICD-10-CM | POA: Diagnosis not present

## 2021-09-11 DIAGNOSIS — N2581 Secondary hyperparathyroidism of renal origin: Secondary | ICD-10-CM | POA: Diagnosis not present

## 2021-09-11 DIAGNOSIS — D631 Anemia in chronic kidney disease: Secondary | ICD-10-CM | POA: Diagnosis not present

## 2021-09-12 ENCOUNTER — Ambulatory Visit: Payer: Medicare Other | Admitting: Endocrinology

## 2021-09-13 DIAGNOSIS — E876 Hypokalemia: Secondary | ICD-10-CM | POA: Diagnosis not present

## 2021-09-13 DIAGNOSIS — D631 Anemia in chronic kidney disease: Secondary | ICD-10-CM | POA: Diagnosis not present

## 2021-09-13 DIAGNOSIS — N2581 Secondary hyperparathyroidism of renal origin: Secondary | ICD-10-CM | POA: Diagnosis not present

## 2021-09-13 DIAGNOSIS — N186 End stage renal disease: Secondary | ICD-10-CM | POA: Diagnosis not present

## 2021-09-13 DIAGNOSIS — Z992 Dependence on renal dialysis: Secondary | ICD-10-CM | POA: Diagnosis not present

## 2021-09-15 DIAGNOSIS — D631 Anemia in chronic kidney disease: Secondary | ICD-10-CM | POA: Diagnosis not present

## 2021-09-15 DIAGNOSIS — Z992 Dependence on renal dialysis: Secondary | ICD-10-CM | POA: Diagnosis not present

## 2021-09-15 DIAGNOSIS — E876 Hypokalemia: Secondary | ICD-10-CM | POA: Diagnosis not present

## 2021-09-15 DIAGNOSIS — N186 End stage renal disease: Secondary | ICD-10-CM | POA: Diagnosis not present

## 2021-09-15 DIAGNOSIS — N2581 Secondary hyperparathyroidism of renal origin: Secondary | ICD-10-CM | POA: Diagnosis not present

## 2021-09-18 DIAGNOSIS — D631 Anemia in chronic kidney disease: Secondary | ICD-10-CM | POA: Diagnosis not present

## 2021-09-18 DIAGNOSIS — N2581 Secondary hyperparathyroidism of renal origin: Secondary | ICD-10-CM | POA: Diagnosis not present

## 2021-09-18 DIAGNOSIS — N186 End stage renal disease: Secondary | ICD-10-CM | POA: Diagnosis not present

## 2021-09-18 DIAGNOSIS — Z992 Dependence on renal dialysis: Secondary | ICD-10-CM | POA: Diagnosis not present

## 2021-09-18 DIAGNOSIS — E876 Hypokalemia: Secondary | ICD-10-CM | POA: Diagnosis not present

## 2021-09-20 DIAGNOSIS — D631 Anemia in chronic kidney disease: Secondary | ICD-10-CM | POA: Diagnosis not present

## 2021-09-20 DIAGNOSIS — E876 Hypokalemia: Secondary | ICD-10-CM | POA: Diagnosis not present

## 2021-09-20 DIAGNOSIS — N186 End stage renal disease: Secondary | ICD-10-CM | POA: Diagnosis not present

## 2021-09-20 DIAGNOSIS — N2581 Secondary hyperparathyroidism of renal origin: Secondary | ICD-10-CM | POA: Diagnosis not present

## 2021-09-20 DIAGNOSIS — Z992 Dependence on renal dialysis: Secondary | ICD-10-CM | POA: Diagnosis not present

## 2021-09-22 ENCOUNTER — Telehealth: Payer: Self-pay | Admitting: Neurology

## 2021-09-22 DIAGNOSIS — N2581 Secondary hyperparathyroidism of renal origin: Secondary | ICD-10-CM | POA: Diagnosis not present

## 2021-09-22 DIAGNOSIS — E876 Hypokalemia: Secondary | ICD-10-CM | POA: Diagnosis not present

## 2021-09-22 DIAGNOSIS — Z992 Dependence on renal dialysis: Secondary | ICD-10-CM | POA: Diagnosis not present

## 2021-09-22 DIAGNOSIS — D631 Anemia in chronic kidney disease: Secondary | ICD-10-CM | POA: Diagnosis not present

## 2021-09-22 DIAGNOSIS — N186 End stage renal disease: Secondary | ICD-10-CM | POA: Diagnosis not present

## 2021-09-22 NOTE — Telephone Encounter (Signed)
[name] called stating the pt is needing a refill request for his donepezil (ARICEPT) 10 MG tablet and his memantine (NAMENDA) 10 MG tablet sent to the [facility]

## 2021-09-24 DIAGNOSIS — N186 End stage renal disease: Secondary | ICD-10-CM | POA: Diagnosis not present

## 2021-09-24 DIAGNOSIS — E1122 Type 2 diabetes mellitus with diabetic chronic kidney disease: Secondary | ICD-10-CM | POA: Diagnosis not present

## 2021-09-24 DIAGNOSIS — Z992 Dependence on renal dialysis: Secondary | ICD-10-CM | POA: Diagnosis not present

## 2021-09-25 DIAGNOSIS — E119 Type 2 diabetes mellitus without complications: Secondary | ICD-10-CM | POA: Diagnosis not present

## 2021-09-25 DIAGNOSIS — L299 Pruritus, unspecified: Secondary | ICD-10-CM | POA: Diagnosis not present

## 2021-09-25 DIAGNOSIS — N186 End stage renal disease: Secondary | ICD-10-CM | POA: Diagnosis not present

## 2021-09-25 DIAGNOSIS — E876 Hypokalemia: Secondary | ICD-10-CM | POA: Diagnosis not present

## 2021-09-25 DIAGNOSIS — Z992 Dependence on renal dialysis: Secondary | ICD-10-CM | POA: Diagnosis not present

## 2021-09-25 DIAGNOSIS — E1129 Type 2 diabetes mellitus with other diabetic kidney complication: Secondary | ICD-10-CM | POA: Diagnosis not present

## 2021-09-25 DIAGNOSIS — N2581 Secondary hyperparathyroidism of renal origin: Secondary | ICD-10-CM | POA: Diagnosis not present

## 2021-09-26 MED ORDER — MEMANTINE HCL 10 MG PO TABS: 10.0000 mg | ORAL_TABLET | Freq: Two times a day (BID) | ORAL | 0 refills | Status: AC

## 2021-09-26 MED ORDER — DONEPEZIL HCL 10 MG PO TABS: 10.0000 mg | ORAL_TABLET | Freq: Every day | ORAL | 0 refills | Status: AC

## 2021-09-26 NOTE — Telephone Encounter (Signed)
Refills of each x 1 sent. Pt last seen 12/2020.

## 2021-09-27 DIAGNOSIS — N186 End stage renal disease: Secondary | ICD-10-CM | POA: Diagnosis not present

## 2021-09-27 DIAGNOSIS — Z992 Dependence on renal dialysis: Secondary | ICD-10-CM | POA: Diagnosis not present

## 2021-09-27 DIAGNOSIS — E119 Type 2 diabetes mellitus without complications: Secondary | ICD-10-CM | POA: Diagnosis not present

## 2021-09-27 DIAGNOSIS — L299 Pruritus, unspecified: Secondary | ICD-10-CM | POA: Diagnosis not present

## 2021-09-27 DIAGNOSIS — N2581 Secondary hyperparathyroidism of renal origin: Secondary | ICD-10-CM | POA: Diagnosis not present

## 2021-09-28 ENCOUNTER — Encounter (HOSPITAL_COMMUNITY): Payer: Self-pay

## 2021-09-29 DIAGNOSIS — L299 Pruritus, unspecified: Secondary | ICD-10-CM | POA: Diagnosis not present

## 2021-09-29 DIAGNOSIS — N2581 Secondary hyperparathyroidism of renal origin: Secondary | ICD-10-CM | POA: Diagnosis not present

## 2021-09-29 DIAGNOSIS — N186 End stage renal disease: Secondary | ICD-10-CM | POA: Diagnosis not present

## 2021-09-29 DIAGNOSIS — Z992 Dependence on renal dialysis: Secondary | ICD-10-CM | POA: Diagnosis not present

## 2021-09-29 DIAGNOSIS — E119 Type 2 diabetes mellitus without complications: Secondary | ICD-10-CM | POA: Diagnosis not present

## 2021-10-01 ENCOUNTER — Encounter: Payer: Self-pay | Admitting: Internal Medicine

## 2021-10-01 ENCOUNTER — Emergency Department (HOSPITAL_COMMUNITY): Payer: Medicare Other

## 2021-10-01 ENCOUNTER — Encounter (HOSPITAL_COMMUNITY): Payer: Self-pay

## 2021-10-01 ENCOUNTER — Inpatient Hospital Stay (HOSPITAL_COMMUNITY)
Admission: EM | Admit: 2021-10-01 | Discharge: 2021-10-05 | DRG: 070 | Disposition: A | Discharge status: Home Health | Payer: Medicare Other | Attending: Internal Medicine | Admitting: Internal Medicine

## 2021-10-01 ENCOUNTER — Telehealth (INDEPENDENT_AMBULATORY_CARE_PROVIDER_SITE_OTHER): Payer: Medicare Other | Admitting: Internal Medicine

## 2021-10-01 DIAGNOSIS — Z79890 Hormone replacement therapy (postmenopausal): Secondary | ICD-10-CM

## 2021-10-01 DIAGNOSIS — E0781 Sick-euthyroid syndrome: Secondary | ICD-10-CM | POA: Diagnosis present

## 2021-10-01 DIAGNOSIS — R911 Solitary pulmonary nodule: Secondary | ICD-10-CM

## 2021-10-01 DIAGNOSIS — G9340 Encephalopathy, unspecified: Secondary | ICD-10-CM | POA: Diagnosis not present

## 2021-10-01 DIAGNOSIS — E039 Hypothyroidism, unspecified: Secondary | ICD-10-CM | POA: Diagnosis present

## 2021-10-01 DIAGNOSIS — G9341 Metabolic encephalopathy: Secondary | ICD-10-CM | POA: Diagnosis present

## 2021-10-01 DIAGNOSIS — E870 Hyperosmolality and hypernatremia: Secondary | ICD-10-CM | POA: Diagnosis not present

## 2021-10-01 DIAGNOSIS — F05 Delirium due to known physiological condition: Secondary | ICD-10-CM | POA: Diagnosis not present

## 2021-10-01 DIAGNOSIS — Z66 Do not resuscitate: Secondary | ICD-10-CM | POA: Diagnosis present

## 2021-10-01 DIAGNOSIS — R1084 Generalized abdominal pain: Secondary | ICD-10-CM

## 2021-10-01 DIAGNOSIS — Z992 Dependence on renal dialysis: Secondary | ICD-10-CM | POA: Diagnosis not present

## 2021-10-01 DIAGNOSIS — I120 Hypertensive chronic kidney disease with stage 5 chronic kidney disease or end stage renal disease: Secondary | ICD-10-CM | POA: Diagnosis present

## 2021-10-01 DIAGNOSIS — I10 Essential (primary) hypertension: Secondary | ICD-10-CM | POA: Diagnosis present

## 2021-10-01 DIAGNOSIS — Z833 Family history of diabetes mellitus: Secondary | ICD-10-CM

## 2021-10-01 DIAGNOSIS — Z888 Allergy status to other drugs, medicaments and biological substances status: Secondary | ICD-10-CM

## 2021-10-01 DIAGNOSIS — D631 Anemia in chronic kidney disease: Secondary | ICD-10-CM | POA: Diagnosis present

## 2021-10-01 DIAGNOSIS — G309 Alzheimer's disease, unspecified: Secondary | ICD-10-CM | POA: Diagnosis not present

## 2021-10-01 DIAGNOSIS — Z743 Need for continuous supervision: Secondary | ICD-10-CM | POA: Diagnosis not present

## 2021-10-01 DIAGNOSIS — Z86718 Personal history of other venous thrombosis and embolism: Secondary | ICD-10-CM

## 2021-10-01 DIAGNOSIS — N390 Urinary tract infection, site not specified: Secondary | ICD-10-CM | POA: Diagnosis present

## 2021-10-01 DIAGNOSIS — R1030 Lower abdominal pain, unspecified: Secondary | ICD-10-CM | POA: Diagnosis not present

## 2021-10-01 DIAGNOSIS — E875 Hyperkalemia: Secondary | ICD-10-CM | POA: Diagnosis present

## 2021-10-01 DIAGNOSIS — Z794 Long term (current) use of insulin: Secondary | ICD-10-CM

## 2021-10-01 DIAGNOSIS — I517 Cardiomegaly: Secondary | ICD-10-CM | POA: Diagnosis not present

## 2021-10-01 DIAGNOSIS — E785 Hyperlipidemia, unspecified: Secondary | ICD-10-CM | POA: Diagnosis present

## 2021-10-01 DIAGNOSIS — Z880 Allergy status to penicillin: Secondary | ICD-10-CM

## 2021-10-01 DIAGNOSIS — E1122 Type 2 diabetes mellitus with diabetic chronic kidney disease: Secondary | ICD-10-CM | POA: Diagnosis present

## 2021-10-01 DIAGNOSIS — K219 Gastro-esophageal reflux disease without esophagitis: Secondary | ICD-10-CM | POA: Diagnosis present

## 2021-10-01 DIAGNOSIS — D181 Lymphangioma, any site: Secondary | ICD-10-CM | POA: Diagnosis present

## 2021-10-01 DIAGNOSIS — R109 Unspecified abdominal pain: Secondary | ICD-10-CM | POA: Diagnosis not present

## 2021-10-01 DIAGNOSIS — N250 Renal osteodystrophy: Secondary | ICD-10-CM | POA: Diagnosis not present

## 2021-10-01 DIAGNOSIS — Z20822 Contact with and (suspected) exposure to covid-19: Secondary | ICD-10-CM | POA: Diagnosis present

## 2021-10-01 DIAGNOSIS — Z87891 Personal history of nicotine dependence: Secondary | ICD-10-CM

## 2021-10-01 DIAGNOSIS — Z79899 Other long term (current) drug therapy: Secondary | ICD-10-CM

## 2021-10-01 DIAGNOSIS — G4730 Sleep apnea, unspecified: Secondary | ICD-10-CM | POA: Diagnosis present

## 2021-10-01 DIAGNOSIS — E1151 Type 2 diabetes mellitus with diabetic peripheral angiopathy without gangrene: Secondary | ICD-10-CM | POA: Diagnosis present

## 2021-10-01 DIAGNOSIS — F0280 Dementia in other diseases classified elsewhere without behavioral disturbance: Secondary | ICD-10-CM | POA: Diagnosis present

## 2021-10-01 DIAGNOSIS — D696 Thrombocytopenia, unspecified: Secondary | ICD-10-CM | POA: Diagnosis present

## 2021-10-01 DIAGNOSIS — N186 End stage renal disease: Secondary | ICD-10-CM | POA: Diagnosis present

## 2021-10-01 DIAGNOSIS — Z8249 Family history of ischemic heart disease and other diseases of the circulatory system: Secondary | ICD-10-CM

## 2021-10-01 DIAGNOSIS — M898X9 Other specified disorders of bone, unspecified site: Secondary | ICD-10-CM | POA: Diagnosis present

## 2021-10-01 DIAGNOSIS — F02818 Dementia in other diseases classified elsewhere, unspecified severity, with other behavioral disturbance: Secondary | ICD-10-CM | POA: Diagnosis present

## 2021-10-01 DIAGNOSIS — I2510 Atherosclerotic heart disease of native coronary artery without angina pectoris: Secondary | ICD-10-CM | POA: Diagnosis present

## 2021-10-01 DIAGNOSIS — I700 Atherosclerosis of aorta: Secondary | ICD-10-CM | POA: Diagnosis not present

## 2021-10-01 DIAGNOSIS — G9389 Other specified disorders of brain: Secondary | ICD-10-CM | POA: Diagnosis not present

## 2021-10-01 DIAGNOSIS — R410 Disorientation, unspecified: Secondary | ICD-10-CM | POA: Diagnosis not present

## 2021-10-01 DIAGNOSIS — R4182 Altered mental status, unspecified: Secondary | ICD-10-CM | POA: Diagnosis not present

## 2021-10-01 LAB — CBC
HCT: 34.5 % — ABNORMAL LOW (ref 39.0–52.0)
Hemoglobin: 10.4 g/dL — ABNORMAL LOW (ref 13.0–17.0)
MCH: 32.6 pg (ref 26.0–34.0)
MCHC: 30.1 g/dL (ref 30.0–36.0)
MCV: 108.2 fL — ABNORMAL HIGH (ref 80.0–100.0)
Platelets: 121 10*3/uL — ABNORMAL LOW (ref 150–400)
RBC: 3.19 MIL/uL — ABNORMAL LOW (ref 4.22–5.81)
RDW: 15.6 % — ABNORMAL HIGH (ref 11.5–15.5)
WBC: 7.2 10*3/uL (ref 4.0–10.5)
nRBC: 0 % (ref 0.0–0.2)

## 2021-10-01 LAB — COMPREHENSIVE METABOLIC PANEL
ALT: 10 U/L (ref 0–44)
AST: 17 U/L (ref 15–41)
Albumin: 2.7 g/dL — ABNORMAL LOW (ref 3.5–5.0)
Alkaline Phosphatase: 199 U/L — ABNORMAL HIGH (ref 38–126)
Anion gap: 9 (ref 5–15)
BUN: 51 mg/dL — ABNORMAL HIGH (ref 8–23)
CO2: 22 mmol/L (ref 22–32)
Calcium: 7.8 mg/dL — ABNORMAL LOW (ref 8.9–10.3)
Chloride: 117 mmol/L — ABNORMAL HIGH (ref 98–111)
Creatinine, Ser: 7.28 mg/dL — ABNORMAL HIGH (ref 0.61–1.24)
GFR, Estimated: 7 mL/min — ABNORMAL LOW (ref >60)
Glucose, Bld: 87 mg/dL (ref 70–99)
Potassium: 4.5 mmol/L (ref 3.5–5.1)
Sodium: 148 mmol/L — ABNORMAL HIGH (ref 135–145)
Total Bilirubin: 0.6 mg/dL (ref 0.3–1.2)
Total Protein: 5.1 g/dL — ABNORMAL LOW (ref 6.5–8.1)

## 2021-10-01 LAB — RESP PANEL BY RT-PCR (FLU A&B, COVID) ARPGX2
Influenza A by PCR: NEGATIVE
Influenza B by PCR: NEGATIVE
SARS Coronavirus 2 by RT PCR: NEGATIVE

## 2021-10-01 LAB — AMMONIA: Ammonia: 37 umol/L — ABNORMAL HIGH (ref 9–35)

## 2021-10-01 MED ORDER — INSULIN ASPART 100 UNIT/ML IJ SOLN: 0.0000 [IU] | INTRAMUSCULAR | Status: DC

## 2021-10-01 MED ORDER — LORAZEPAM 2 MG/ML IJ SOLN
2.0000 mg | Freq: Once | INTRAMUSCULAR | Status: AC
  Administered 2021-10-01: 2 mg via INTRAVENOUS
  Filled 2021-10-01: qty 1

## 2021-10-01 NOTE — ED Notes (Signed)
Pt return from CT.

## 2021-10-01 NOTE — ED Notes (Signed)
Pt keeps calling out and stating he needs to leave. Pt has been redirected multiple times that the MD is waiting for results before he can leave.

## 2021-10-01 NOTE — ED Triage Notes (Signed)
Pr BIBA for LLQ ABD pain x 1 week and new onset increased confusion. Per EMS, wife states he has dementia but this confusion is worse than usual and that he has had this level of confusion with previous UTIs. Pt does not produce urine.

## 2021-10-01 NOTE — ED Notes (Signed)
Pt becoming beligerant, stating he is going home and attempts to get out of bed. PA [place] made aware, awaiting orders

## 2021-10-01 NOTE — ED Provider Notes (Signed)
[facility] EMERGENCY DEPARTMENT Provider Note   CSN: [id number] Arrival date & time: 10/01/21  1814     History  Chief Complaint  Patient presents with   Abdominal Pain    confusion    [name] is a 81 y.o. male with a past medical history of ESRD on dialysis MWF, diverticulosis, hypertension, DVT and dementia presenting today with increased confusion.  Patient is a poor historian and states "I do not know why I am here.  I should not be here," when asked why he is in the ED today.   Collateral obtained from patient's wife who reports that for the past week he has been declining.  Has noticed that he has been complaining about pain in his groin as well as an increase in somnolence.  She reports he is having worsening confusion, and is often unable to state where he is.    Home Medications Prior to Admission medications   Medication Sig Start Date End Date Taking? Authorizing Provider  acetaminophen (TYLENOL) 325 MG tablet Take 1-2 tablets (325-650 mg total) by mouth every 4 (four) hours as needed for mild pain. 08/01/18   Love, Ivan Anchors, PA-C  albuterol (VENTOLIN HFA) 108 (90 Base) MCG/ACT inhaler Inhale 1 puff into the lungs daily as needed for wheezing or shortness of breath.    [provider]  allopurinol (ZYLOPRIM) 100 MG tablet Take 100 mg by mouth daily.    [provider]  Blood Glucose Monitoring Suppl (FREESTYLE FREEDOM LITE) w/Device KIT Use to check blood sugar 2 times per day dx code E11.65 10/21/18   Elayne Snare, MD  camphor-menthol [facility]) lotion Apply 1 application topically 4 (four) times daily as needed for itching.     [provider]  Carboxymethylcellulose Sodium (THERATEARS) 0.25 % SOLN Place 1 drop into both eyes as needed (for dryness).    [provider]  cetirizine (ZYRTEC) 10 MG chewable tablet Chew 10 mg by mouth every Monday, Wednesday, and Friday.    [provider]  clobetasol (TEMOVATE)  0.05 % external solution Apply 1 application topically 2 (two) times daily as needed (scalp irritation.).  04/19/15   [provider]  donepezil (ARICEPT) 10 MG tablet Take 1 tablet (10 mg total) by mouth at bedtime. 09/26/21   Melvenia Beam, MD  FLUOCINOLONE ACETONIDE SCALP 0.01 % OIL Apply 1 application topically daily as needed (scalp irritation.).  04/28/15   [provider]  fluticasone (FLONASE) 50 MCG/ACT nasal spray Place 1 spray into both nostrils daily. 10/08/14   Mikhail, Velta Addison, DO  glucose blood (FREESTYLE LITE) test strip USE AS DIRECTED THREE TIMES DAILY 01/16/21   Elayne Snare, MD  hydrocortisone 2.5 % cream Apply 1 application topically 3 (three) times daily as needed (skin irritation (legs & arms)).  01/13/19   [provider]  Insulin Pen Needle (PEN NEEDLES) 32G X 4 MM MISC 1 each by Does not apply route 3 (three) times daily. USE PEN NEEDLES TO INJECT INSULIN THREE TIMES DAILY. 11/25/18   Elayne Snare, MD  ketoconazole (NIZORAL) 2 % shampoo Apply 1 application topically 2 (two) times a week. Patient taking differently: Apply 1 application topically 2 (two) times a week. Sundays, and any other day 03/31/15   Elby Showers, MD  Lancets (FREESTYLE) lancets Use as instructed to check blood sugar 2 times per day dx code E11.65 02/22/16   Elayne Snare, MD  levothyroxine (SYNTHROID, LEVOTHROID) 50 MCG tablet Take 1 tablet (  50 mcg total) by mouth daily. Patient taking differently: Take 50 mcg by mouth daily before breakfast. 08/06/14   Baxley, Cresenciano Lick, MD  lidocaine (LIDODERM) 5 % Place 1 patch onto the skin daily as needed (back pain).  11/12/19   [provider]  memantine (NAMENDA) 10 MG tablet Take 1 tablet (10 mg total) by mouth 2 (two) times daily. 09/26/21   Melvenia Beam, MD  midodrine (PROAMATINE) 10 MG tablet Take one pill prior to hemodialysis on MWF Patient taking differently: Take 5 mg by mouth See admin instructions. Take one tablet by mouth prior to  hemodialysis on MWF 08/13/18   Love, Ivan Anchors, PA-C  Nutritional Supplements (FEEDING SUPPLEMENT, NEPRO CARB STEADY,) LIQD Take 237 mLs by mouth in the morning and at bedtime.     [provider]  omeprazole (PRILOSEC) 40 MG capsule Take 1 capsule (40 mg total) by mouth daily. 03/13/18   Esterwood, Amy S, PA-C  sevelamer carbonate (RENVELA) 800 MG tablet Take 1,600-2,400 mg by mouth See admin instructions. Take 2,400 mg by mouth three times a day with meals and 1,600 mg with snacks on Sun/Tues/Thurs/Sat and 2,400 mg with breakfast and dinner and nothing with snacks on Mon/Wed/Fri    [provider]  simvastatin (ZOCOR) 20 MG tablet Take 20 mg by mouth at bedtime.    [provider]  Teriparatide, Recombinant, (FORTEO) 600 MCG/2.4ML SOPN Inject 20 mcg subcutaneously daily 09/08/21   Elayne Snare, MD  TRADJENTA 5 MG TABS tablet TAKE 1 TABLET DAILY 03/01/20   Elayne Snare, MD      Allergies    Ambien [zolpidem tartrate] and Penicillins    Review of Systems   Review of Systems  Reason unable to perform ROS: AMS, dementia.  Gastrointestinal:  Positive for abdominal pain.   Physical Exam Updated Vital Signs BP (!) 123/111 (BP Location: Left Arm)    Pulse 74    Temp 98.2 F (36.8 C) (Oral)    Resp 16    Ht _0  (1.803 m)    Wt 68.5 kg    SpO2 98%    BMI 21.06 kg/m  Physical Exam Vitals and nursing note reviewed.  Constitutional:      General: He is not in acute distress.    Appearance: Normal appearance. He is not ill-appearing.  HENT:     Head: Normocephalic and atraumatic.  Eyes:     General: No scleral icterus.    Conjunctiva/sclera: Conjunctivae normal.  Cardiovascular:     Rate and Rhythm: Normal rate and regular rhythm.  Pulmonary:     Effort: Pulmonary effort is normal. No respiratory distress.     Breath sounds: No wheezing or rales.  Abdominal:     General: Abdomen is flat.     Palpations: Abdomen is soft.     Tenderness: There is abdominal tenderness  in the suprapubic area.     Hernia: No hernia is present.  Skin:    General: Skin is warm and dry.     Findings: No rash.  Neurological:     Mental Status: He is alert. He is disoriented.     Comments: Oriented to self only  Psychiatric:        Mood and Affect: Mood normal.    ED Results / Procedures / Treatments   Labs (all labs ordered are listed, but only abnormal results are displayed) Labs Reviewed  COMPREHENSIVE METABOLIC PANEL - Abnormal; Notable for the following components:  Result Value   Sodium 148 (*)    Chloride 117 (*)    BUN 51 (*)    Creatinine, Ser 7.28 (*)    Calcium 7.8 (*)    Total Protein 5.1 (*)    Albumin 2.7 (*)    Alkaline Phosphatase 199 (*)    GFR, Estimated 7 (*)    All other components within normal limits  CBC - Abnormal; Notable for the following components:   RBC 3.19 (*)    Hemoglobin 10.4 (*)    HCT 34.5 (*)    MCV 108.2 (*)    RDW 15.6 (*)    Platelets 121 (*)    All other components within normal limits  RESP PANEL BY RT-PCR (FLU A&B, COVID) ARPGX2  URINALYSIS, ROUTINE W REFLEX MICROSCOPIC  AMMONIA  CBG MONITORING, ED    EKG None  Radiology CT Head Wo Contrast  Result Date: 10/01/2021 CLINICAL DATA:  Mental status change, unknown cause Worsening confusion. EXAM: CT HEAD WITHOUT CONTRAST TECHNIQUE: Contiguous axial images were obtained from the base of the skull through the vertex without intravenous contrast. COMPARISON:  Head CT 11/07/2020 FINDINGS: Brain: Chronic bifrontal hygromas, left greater than right, not significantly changed from prior exam. This measures up to 10 mm in the left in the frontal region, measured on series 4, image 10. Left hygroma is minimally increased in density compared to CSF, but stable from prior exam. No hyperdense component to suggest acute hemorrhage. Generalized atrophy and chronic small vessel ischemia which is stable. No evidence of acute infarct. No midline shift or mass effect. Vascular:  Atherosclerosis of skullbase vasculature without hyperdense vessel or abnormal calcification. Skull: Bilateral frontal burr holes.  No fracture or focal lesion. Sinuses/Orbits: Paranasal sinuses and mastoid air cells are clear. The visualized orbits are unremarkable. Bilateral cataract resection. Other: None. IMPRESSION: 1. No acute intracranial abnormality. 2. Chronic bifrontal hygromas, left greater than right, not significantly changed from prior exam. No hyperdense component to suggest acute hemorrhage. 3. Stable atrophy and chronic small vessel ischemia. Electronically Signed   By: Keith Rake M.D.   On: 10/01/2021 20:17   DG Chest Portable 1 View  Result Date: 10/01/2021 CLINICAL DATA:  Altered mental status. EXAM: PORTABLE CHEST 1 VIEW COMPARISON:  Radiograph 07/03/2021 FINDINGS: Right-sided dialysis catheter in place. Chronic cardiomegaly with equivocal increase. Aortic atherosclerosis and tortuosity, stable. Slight increase in interstitial thickening from prior exam. Slight blunting of both costophrenic angle suggests small effusions. No confluent consolidation. No pneumothorax. No acute osseous abnormalities are seen. IMPRESSION: Chronic cardiomegaly with equivocal increase. Slight progression in interstitial thickening from prior exam, pulmonary edema versus atypical infection. Possible small pleural effusions. Electronically Signed   By: Keith Rake M.D.   On: 10/01/2021 19:29    Procedures Procedures    Medications Ordered in ED Medications  insulin aspart (novoLOG) injection 0-9 Units (has no administration in time range)  LORazepam (ATIVAN) injection 2 mg (2 mg Intravenous Given 10/01/21 2314)    ED Course/ Medical Decision Making/ A&P                           Medical Decision Making  81 year old male presenting today due to increased confusion and abdominal pain that he complained of to his wife and primary care provider.   During my history taking, patient was only  oriented to self.  I obtained via collateral from patient's wife who says that he has been complaining of abdominal and  suprapubic pain over the past week.  He has also been increasingly confused.  She denies any falls, changes in his ambulation or oral intake.  Patient was also seen by Dr. Darl Householder who did a bladder scanner that was revealing of no urine.  In and out UA discontinued.  Chest x-ray shows pulmonary edema versus atypical pneumonia.  Patient is afebrile, no leukocytosis or history of URI symptoms per wife.  Low suspicion for pneumonia, more likely pulmonary edema especially with history of ESRD.  Patient is to have dialysis on Monday Wednesday Fridays, scheduled for tomorrow morning.  CT abdomen without contrast was performed due to patient's kidney disease.  This was negative.  Due to his disorientation and abdominal tenderness, patient will be admitted to the hospital for further evaluation and assistance.  Nephrology was consulted and [facility] is to place the patient on the list of patients to be dialyzed tomorrow morning.  Specialist paged for admission.  I spoke with hospitalist MD Alcario Drought who suggested I get an i-STAT ABG.  This has been ordered at this time, nursing notified.   Final Clinical Impression(s) / ED Diagnoses Final diagnoses:  Encephalopathy  Generalized abdominal pain    Rx / DC Orders Admit to MD Alcario Drought, hospitalist.   Rhae Hammock, PA-C 10/01/21 2359    Drenda Freeze, MD 10/04/21 [phone]

## 2021-10-01 NOTE — ED Notes (Signed)
ED Provider at bedside. 

## 2021-10-01 NOTE — Telephone Encounter (Signed)
Patient's wife contacted answering service this afternoon. He has been confused yesterday and today. Does not have fever. Says he had normal BM this morning. Has some pain over mid lower abdomen. He is seen by Palliative Care also. 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  Patient and his wife are at home and I am at my office. Wife called answering service around 3 pm today and I returned the call.  Had enteroscopy by Dr Lorenso Courier in October.Had normal esophagus with single non-bleeding angioectasia in the stomach; single non-bleeding angioectasia in the duodenum treated with plasma coagulation. Had 2 in the proximal jejunum also treated with same method. Is on PPI.Denies BRBPR or melena.  His wife and I discussed calling paramedics to evaluate. He seems to know me but disoriented to month and day of week which is not unusual for him. He carries on a clear conversation. Denies pain in abdomen at present.  We have decided to observe for now. Consider evaluation in ED if not improving in am- will need EMS transport.  Time spent reviewing record, medical decision making speaking with patient and wife is 20 minutes.  MJB, MD

## 2021-10-01 NOTE — ED Notes (Signed)
Lab called to add on orders

## 2021-10-01 NOTE — ED Notes (Signed)
Patient transported to CT 

## 2021-10-02 DIAGNOSIS — E870 Hyperosmolality and hypernatremia: Secondary | ICD-10-CM | POA: Diagnosis not present

## 2021-10-02 DIAGNOSIS — G9340 Encephalopathy, unspecified: Secondary | ICD-10-CM

## 2021-10-02 DIAGNOSIS — G309 Alzheimer's disease, unspecified: Secondary | ICD-10-CM | POA: Diagnosis not present

## 2021-10-02 DIAGNOSIS — N186 End stage renal disease: Secondary | ICD-10-CM

## 2021-10-02 DIAGNOSIS — R1084 Generalized abdominal pain: Secondary | ICD-10-CM

## 2021-10-02 LAB — URINALYSIS, ROUTINE W REFLEX MICROSCOPIC
Bilirubin Urine: NEGATIVE
Glucose, UA: NEGATIVE mg/dL
Ketones, ur: NEGATIVE mg/dL
Nitrite: NEGATIVE
Protein, ur: 100 mg/dL — AB
Specific Gravity, Urine: 1.015 (ref 1.005–1.030)
pH: 8.5 — ABNORMAL HIGH (ref 5.0–8.0)

## 2021-10-02 LAB — BASIC METABOLIC PANEL
Anion gap: 15 (ref 5–15)
BUN: 63 mg/dL — ABNORMAL HIGH (ref 8–23)
CO2: 26 mmol/L (ref 22–32)
Calcium: 9.8 mg/dL (ref 8.9–10.3)
Chloride: 109 mmol/L (ref 98–111)
Creatinine, Ser: 9.3 mg/dL — ABNORMAL HIGH (ref 0.61–1.24)
GFR, Estimated: 5 mL/min — ABNORMAL LOW (ref >60)
Glucose, Bld: 93 mg/dL (ref 70–99)
Potassium: 5.6 mmol/L — ABNORMAL HIGH (ref 3.5–5.1)
Sodium: 150 mmol/L — ABNORMAL HIGH (ref 135–145)

## 2021-10-02 LAB — CBC
HCT: 38.8 % — ABNORMAL LOW (ref 39.0–52.0)
Hemoglobin: 12.2 g/dL — ABNORMAL LOW (ref 13.0–17.0)
MCH: 33.2 pg (ref 26.0–34.0)
MCHC: 31.4 g/dL (ref 30.0–36.0)
MCV: 105.7 fL — ABNORMAL HIGH (ref 80.0–100.0)
Platelets: 140 10*3/uL — ABNORMAL LOW (ref 150–400)
RBC: 3.67 MIL/uL — ABNORMAL LOW (ref 4.22–5.81)
RDW: 15.6 % — ABNORMAL HIGH (ref 11.5–15.5)
WBC: 9.6 10*3/uL (ref 4.0–10.5)
nRBC: 0 % (ref 0.0–0.2)

## 2021-10-02 LAB — CBG MONITORING, ED
Glucose-Capillary: 69 mg/dL — ABNORMAL LOW (ref 70–99)
Glucose-Capillary: 75 mg/dL (ref 70–99)
Glucose-Capillary: 75 mg/dL (ref 70–99)
Glucose-Capillary: 77 mg/dL (ref 70–99)
Glucose-Capillary: 84 mg/dL (ref 70–99)
Glucose-Capillary: 88 mg/dL (ref 70–99)

## 2021-10-02 LAB — URINALYSIS, MICROSCOPIC (REFLEX)
RBC / HPF: >50 RBC/hpf (ref 0–5)
WBC, UA: >50 WBC/hpf (ref 0–5)

## 2021-10-02 LAB — I-STAT ARTERIAL BLOOD GAS, ED
Acid-Base Excess: 0 mmol/L (ref 0.0–2.0)
Bicarbonate: 24.7 mmol/L (ref 20.0–28.0)
Calcium, Ion: 1.25 mmol/L (ref 1.15–1.40)
HCT: 34 % — ABNORMAL LOW (ref 39.0–52.0)
Hemoglobin: 11.6 g/dL — ABNORMAL LOW (ref 13.0–17.0)
O2 Saturation: 95 %
Patient temperature: 98.2
Potassium: 5.1 mmol/L (ref 3.5–5.1)
Sodium: 147 mmol/L — ABNORMAL HIGH (ref 135–145)
TCO2: 26 mmol/L (ref 22–32)
pCO2 arterial: 37.7 mmHg (ref 32.0–48.0)
pH, Arterial: 7.424 (ref 7.350–7.450)
pO2, Arterial: 73 mmHg — ABNORMAL LOW (ref 83.0–108.0)

## 2021-10-02 LAB — VITAMIN B12: Vitamin B-12: 463 pg/mL (ref 180–914)

## 2021-10-02 LAB — T4, FREE: Free T4: 0.83 ng/dL (ref 0.61–1.12)

## 2021-10-02 LAB — TSH: TSH: 0.241 u[IU]/mL — ABNORMAL LOW (ref 0.350–4.500)

## 2021-10-02 LAB — HEPATITIS B SURFACE ANTIGEN: Hepatitis B Surface Ag: NONREACTIVE

## 2021-10-02 LAB — HEMOGLOBIN A1C
Hgb A1c MFr Bld: 5.8 % — ABNORMAL HIGH (ref 4.8–5.6)
Mean Plasma Glucose: 119.76 mg/dL

## 2021-10-02 MED ORDER — ACETAMINOPHEN 650 MG RE SUPP: 650.0000 mg | Freq: Four times a day (QID) | RECTAL | Status: DC | PRN

## 2021-10-02 MED ORDER — HYDRALAZINE HCL 10 MG PO TABS: 10.0000 mg | ORAL_TABLET | Freq: Three times a day (TID) | ORAL | Status: DC | PRN

## 2021-10-02 MED ORDER — HEPARIN SODIUM (PORCINE) 5000 UNIT/ML IJ SOLN: 5000.0000 [IU] | Freq: Three times a day (TID) | INTRAMUSCULAR | Status: DC

## 2021-10-02 MED ORDER — HALOPERIDOL LACTATE 5 MG/ML IJ SOLN
2.0000 mg | Freq: Four times a day (QID) | INTRAMUSCULAR | Status: DC | PRN
  Administered 2021-10-03: 3 mg via INTRAVENOUS
  Filled 2021-10-02: qty 1

## 2021-10-02 MED ORDER — LIDOCAINE HCL (PF) 1 % IJ SOLN: 5.0000 mL | INTRAMUSCULAR | Status: DC | PRN

## 2021-10-02 MED ORDER — CINACALCET HCL 30 MG PO TABS
30.0000 mg | ORAL_TABLET | Freq: Every day | ORAL | Status: DC
  Administered 2021-10-02 – 2021-10-04 (×3): 30 mg via ORAL
  Filled 2021-10-02 (×3): qty 1

## 2021-10-02 MED ORDER — PROSOURCE PLUS PO LIQD
30.0000 mL | Freq: Two times a day (BID) | ORAL | Status: DC
  Administered 2021-10-03 – 2021-10-05 (×4): 30 mL via ORAL
  Filled 2021-10-02 (×4): qty 30

## 2021-10-02 MED ORDER — ONDANSETRON HCL 4 MG/2ML IJ SOLN
4.0000 mg | Freq: Four times a day (QID) | INTRAMUSCULAR | Status: DC | PRN
  Administered 2021-10-04: 4 mg via INTRAVENOUS
  Filled 2021-10-02: qty 2

## 2021-10-02 MED ORDER — SODIUM CHLORIDE 0.9 % IV SOLN: 100.0000 mL | INTRAVENOUS | Status: DC | PRN

## 2021-10-02 MED ORDER — PENTAFLUOROPROP-TETRAFLUOROETH EX AERO: 1.0000 "application " | INHALATION_SPRAY | CUTANEOUS | Status: DC | PRN

## 2021-10-02 MED ORDER — HEPARIN SODIUM (PORCINE) 1000 UNIT/ML DIALYSIS
1000.0000 [IU] | INTRAMUSCULAR | Status: DC | PRN
  Filled 2021-10-02: qty 1

## 2021-10-02 MED ORDER — PANTOPRAZOLE SODIUM 40 MG IV SOLR
40.0000 mg | Freq: Two times a day (BID) | INTRAVENOUS | Status: DC
  Administered 2021-10-02 – 2021-10-04 (×4): 40 mg via INTRAVENOUS
  Filled 2021-10-02 (×4): qty 40

## 2021-10-02 MED ORDER — ALTEPLASE 2 MG IJ SOLR: 2.0000 mg | Freq: Once | INTRAMUSCULAR | Status: DC | PRN

## 2021-10-02 MED ORDER — ONDANSETRON HCL 4 MG PO TABS: 4.0000 mg | ORAL_TABLET | Freq: Four times a day (QID) | ORAL | Status: DC | PRN

## 2021-10-02 MED ORDER — DOXERCALCIFEROL 4 MCG/2ML IV SOLN
3.0000 ug | INTRAVENOUS | Status: DC
  Administered 2021-10-04: 3 ug via INTRAVENOUS
  Filled 2021-10-02 (×2): qty 2

## 2021-10-02 MED ORDER — LIDOCAINE-PRILOCAINE 2.5-2.5 % EX CREA: 1.0000 "application " | TOPICAL_CREAM | CUTANEOUS | Status: DC | PRN

## 2021-10-02 MED ORDER — ACETAMINOPHEN 325 MG PO TABS
650.0000 mg | ORAL_TABLET | Freq: Four times a day (QID) | ORAL | Status: DC | PRN
  Administered 2021-10-02 – 2021-10-05 (×2): 650 mg via ORAL
  Filled 2021-10-02 (×2): qty 2

## 2021-10-02 MED ORDER — RENA-VITE PO TABS
1.0000 | ORAL_TABLET | Freq: Every day | ORAL | Status: DC
  Administered 2021-10-03 – 2021-10-04 (×2): 1 via ORAL
  Filled 2021-10-02 (×3): qty 1

## 2021-10-02 MED ORDER — HALOPERIDOL LACTATE 5 MG/ML IJ SOLN
2.0000 mg | Freq: Four times a day (QID) | INTRAMUSCULAR | Status: DC | PRN
  Administered 2021-10-02: 2 mg via INTRAVENOUS
  Filled 2021-10-02: qty 1

## 2021-10-02 MED ORDER — CHLORHEXIDINE GLUCONATE CLOTH 2 % EX PADS
6.0000 | MEDICATED_PAD | Freq: Every day | CUTANEOUS | Status: DC
  Administered 2021-10-03 – 2021-10-05 (×3): 6 via TOPICAL

## 2021-10-02 MED ORDER — NEPRO/CARBSTEADY PO LIQD
237.0000 mL | Freq: Three times a day (TID) | ORAL | Status: DC
  Administered 2021-10-02 – 2021-10-05 (×6): 237 mL via ORAL
  Filled 2021-10-02: qty 237

## 2021-10-02 NOTE — ED Notes (Signed)
Pt again able to remove leads and SPO2. Restraints readjusted, equipment reapplied

## 2021-10-02 NOTE — ED Notes (Signed)
Pt placed on bedpan

## 2021-10-02 NOTE — Progress Notes (Signed)
ABG collected  

## 2021-10-02 NOTE — ED Notes (Signed)
Pt has pulled out IV and removed most of his monitoring equipment. MD Alcario Drought sent message and made aware

## 2021-10-02 NOTE — Consult Note (Signed)
[facility] Renal Consultation Note    Indication for Consultation:  Management of ESRD/hemodialysis; anemia, hypertension/volume and secondary hyperparathyroidism PCP: Dr. Tommie Ard Baxley  HPI: [name] is a 81 y.o. male with ESRD on hemodialysis MWF at [facility]. PHM: DMT2, HTN, PVD S/P aortobifemoral bypass, melena H/O AVMS, Dementia, hypothyroidism, SHPT, Anemia of ESRD. Last HD 09/29/2021 signed off 14 minutes early, left 0.7 kg above OP EDW.   He presented to ED early AM with C/O worsening confusion. Na 148 K+ 4.5 SCr 7.28 BUN 51 CO2 22 CXR Slight progression in interstitial thickening from prior exam, pulmonary edema versus atypical infection. Possible small pleural effusions. CT of ab/pelvis, head without acute abnormalities. He has been admitted as observation patient for acute encephalopathy on pre-existing dementia.   He was recently started on Korsuva as OP which is a Kappa Opioid Receptor Agonist  with adverse reaction of mental status changes in 3% of population. Will hold drug. BP is high, few basilar crackles. Wife reports he has been eating and drinking as usual. Will have HD today on schedule. HD should correct hypernatremia. Has BP cuff on new R AVF.   Past Medical History:  Diagnosis Date   Allergy    Alzheimer's disease ([place]) 01/19/2021   Anemia    Arthritis    Cataract    bil cateracts removed   Coronary artery disease    Dementia arising in the senium and presenium ([place])    Diabetes mellitus    Type 2   Diverticulitis    ED (erectile dysfunction)    Elevated homocysteine    ESRD (end stage renal disease) on dialysis ([place]) 03/2015   M-W-F dialysis   GERD (gastroesophageal reflux disease)    pepto    Gout    Hiatal hernia    Hyperlipidemia    Hypertension    Hypothyroidism    PVD (peripheral vascular disease) ([place])    has plastic aorta   Renal insufficiency    Sleep apnea    does not wear c-pap   Past Surgical  History:  Procedure Laterality Date   ANGIOPLASTY Right 01/26/2021   Procedure: ANGIOPLASTY RIGHT SUBCLAVIAN VEIN;  Surgeon: Serafina Mitchell, MD;  Location: [place];  Service: Vascular;  Laterality: Right;   aortobifemoral bypass     AV FISTULA PLACEMENT Left 12/01/2013   Procedure: ARTERIOVENOUS (AV) FISTULA CREATION- LEFT BRACHIOCEPHALIC;  Surgeon: Angelia Mould, MD;  Location: [place];  Service: Vascular;  Laterality: Left;   [place] Right 07/27/2014   Procedure: BASCILIC VEIN TRANSPOSITION;  Surgeon: Angelia Mould, MD;  Location: [place];  Service: Vascular;  Laterality: Right;   BRAIN SURGERY  07/22/2018   BREAST SURGERY     left - granulomatous mastitis   BURR HOLE Bilateral 07/22/2018   Procedure: BILATERAL BURR HOLES;  Surgeon: Kristeen Miss, MD;  Location: [place];  Service: Neurosurgery;  Laterality: Bilateral;   COLONOSCOPY     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 ([place] [facility])  1992   ENTEROSCOPY N/A 06/03/2018   Procedure: ENTEROSCOPY;  Surgeon: Lavena Bullion, DO;  Location: [facility] ENDOSCOPY;  Service: Gastroenterology;  Laterality: N/A;   ENTEROSCOPY N/A 11/20/2018   Procedure: ENTEROSCOPY;  Surgeon: Rush Landmark Telford Nab., MD;  Location: [place];  Service: Gastroenterology;  Laterality: N/A;   ENTEROSCOPY N/A 12/03/2019   Procedure: ENTEROSCOPY;  Surgeon: Lavena Bullion, DO;  Location: [facility] ENDOSCOPY;  Service: Gastroenterology;  Laterality: N/A;  push enteroscopy  ENTEROSCOPY N/A 07/04/2021   Procedure: ENTEROSCOPY;  Surgeon: Sharyn Creamer, MD;  Location: [facility] ENDOSCOPY;  Service: Gastroenterology;  Laterality: N/A;   EYE SURGERY Bilateral    cataracts   FISTULOGRAM Right 01/26/2021   Procedure: FISTULOGRAM RIGHT;  Surgeon: Serafina Mitchell, MD;  Location: [facility] OR;  Service: Vascular;  Laterality: Right;   HEMODIALYSIS INPATIENT  01/17/2018       HOT HEMOSTASIS N/A 06/03/2018   Procedure: HOT HEMOSTASIS (ARGON PLASMA COAGULATION/BICAP);  Surgeon:  Lavena Bullion, DO;  Location: [facility] ENDOSCOPY;  Service: Gastroenterology;  Laterality: N/A;   HOT HEMOSTASIS N/A 11/20/2018   Procedure: HOT HEMOSTASIS (ARGON PLASMA COAGULATION/BICAP);  Surgeon: Irving Copas., MD;  Location: [place];  Service: Gastroenterology;  Laterality: N/A;   HOT HEMOSTASIS N/A 12/03/2019   Procedure: HOT HEMOSTASIS (ARGON PLASMA COAGULATION/BICAP);  Surgeon: Lavena Bullion, DO;  Location: [facility] ENDOSCOPY;  Service: Gastroenterology;  Laterality: N/A;   INTRAMEDULLARY (IM) NAIL INTERTROCHANTERIC Right 11/08/2020   Procedure: INTRAMEDULLARY (IM) NAIL INTERTROCHANTRIC;  Surgeon: Erle Crocker, MD;  Location: [place];  Service: Orthopedics;  Laterality: Right;   REVISION OF ARTERIOVENOUS GORETEX GRAFT Right 09/01/2019   Procedure: REVISION OF ARTERIOVENOUS FISTULA RIGHT ARM;  Surgeon: Serafina Mitchell, MD;  Location: [place];  Service: Vascular;  Laterality: Right;   REVISON OF ARTERIOVENOUS FISTULA Left 02/09/2014   Procedure: REVISON OF LEFT ARTERIOVENOUS FISTULA - RESECTION OF RENDUNDANT VEIN;  Surgeon: Angelia Mould, MD;  Location: [place];  Service: Vascular;  Laterality: Left;   REVISON OF ARTERIOVENOUS FISTULA Right 01/26/2021   Procedure: REVISON OF ARTERIOVENOUS FISTULA RIGHT;  Surgeon: Serafina Mitchell, MD;  Location: [place];  Service: Vascular;  Laterality: Right;   SBO with lysis adhesions     SHUNTOGRAM Left 04/19/2014   Procedure: FISTULOGRAM;  Surgeon: Angelia Mould, MD;  Location: [facility] CATH LAB;  Service: Cardiovascular;  Laterality: Left;   UNILATERAL UPPER EXTREMEITY ANGIOGRAM N/A 07/12/2014   Procedure: UNILATERAL UPPER Anselmo Rod;  Surgeon: Angelia Mould, MD;  Location: [facility] CATH LAB;  Service: Cardiovascular;  Laterality: N/A;   Family History  Problem Relation Age of Onset   Aneurysm Mother        brain   Heart disease Father    Stroke Father    Hypertension Father    Diabetes Father    Dementia Neg Hx     Colon cancer Neg Hx    Esophageal cancer Neg Hx    Pancreatic cancer Neg Hx    Prostate cancer Neg Hx    Rectal cancer Neg Hx    Stomach cancer Neg Hx    Social History:  reports that he quit smoking about 27 years ago. His smoking use included cigarettes. He has never used smokeless tobacco. He reports that he does not drink alcohol and does not use drugs. Allergies  Allergen Reactions   Ambien [Zolpidem Tartrate] Other (See Comments)    Hallucinations and "felt crazy"    Penicillins Rash and Hives    Has patient had a PCN reaction causing immediate rash, facial/tongue/throat swelling, SOB or lightheadedness with hypotension: Yes Has patient had a PCN reaction causing severe rash involving mucus membranes or skin necrosis: Yes Has patient had a PCN reaction that required hospitalization: No Has patient had a PCN reaction occurring within the last 10 years: No If all of the above answers are "NO", then may proceed with Cephalosporin use.  Other reaction(s): HIVES   Prior to Admission medications  Medication Sig Start Date End Date Taking? Authorizing Provider  acetaminophen (TYLENOL) 325 MG tablet Take 1-2 tablets (325-650 mg total) by mouth every 4 (four) hours as needed for mild pain. 08/01/18  Yes Love, Ivan Anchors, PA-C  albuterol (VENTOLIN HFA) 108 (90 Base) MCG/ACT inhaler Inhale 1 puff into the lungs daily as needed for wheezing or shortness of breath.   Yes [provider]  allopurinol (ZYLOPRIM) 100 MG tablet Take 100 mg by mouth daily.   Yes [provider]  Blood Glucose Monitoring Suppl (FREESTYLE FREEDOM LITE) w/Device KIT Use to check blood sugar 2 times per day dx code E11.65 10/21/18  Yes Elayne Snare, MD  camphor-menthol [facility]) lotion Apply 1 application topically 4 (four) times daily as needed for itching.    Yes [provider]  Carboxymethylcellulose Sodium (THERATEARS) 0.25 % SOLN Place 1 drop into both eyes as needed (for dryness).   Yes  [provider]  cetirizine (ZYRTEC) 10 MG tablet Take 10 mg by mouth daily as needed for allergies.   Yes [provider]  clobetasol (TEMOVATE) 0.05 % external solution Apply 1 application topically 2 (two) times daily as needed (scalp irritation.).  04/19/15  Yes [provider]  donepezil (ARICEPT) 10 MG tablet Take 1 tablet (10 mg total) by mouth at bedtime. 09/26/21  Yes Melvenia Beam, MD  FLUOCINOLONE ACETONIDE SCALP 0.01 % OIL Apply 1 application topically daily as needed (scalp irritation.).  04/28/15  Yes [provider]  fluticasone (FLONASE) 50 MCG/ACT nasal spray Place 1 spray into both nostrils daily. Patient taking differently: Place 1 spray into both nostrils daily as needed for allergies. 10/08/14  Yes Mikhail, Garber, DO  glucose blood (FREESTYLE LITE) test strip USE AS DIRECTED THREE TIMES DAILY Patient taking differently: Check blood sugar bid 01/16/21  Yes Elayne Snare, MD  hydrocortisone 2.5 % cream Apply 1 application topically 3 (three) times daily as needed (skin irritation (legs & arms)).  01/13/19  Yes [provider]  Insulin Pen Needle (PEN NEEDLES) 32G X 4 MM MISC 1 each by Does not apply route 3 (three) times daily. USE PEN NEEDLES TO INJECT INSULIN THREE TIMES DAILY. 11/25/18  Yes Elayne Snare, MD  Lancets (FREESTYLE) lancets Use as instructed to check blood sugar 2 times per day dx code E11.65 02/22/16  Yes Elayne Snare, MD  levothyroxine (SYNTHROID, LEVOTHROID) 50 MCG tablet Take 1 tablet (50 mcg total) by mouth daily. Patient taking differently: Take 50 mcg by mouth daily before breakfast. 08/06/14  Yes Baxley, Cresenciano Lick, MD  memantine (NAMENDA) 10 MG tablet Take 1 tablet (10 mg total) by mouth 2 (two) times daily. 09/26/21  Yes Melvenia Beam, MD  midodrine (PROAMATINE) 10 MG tablet Take one pill prior to hemodialysis on MWF Patient taking differently: Take 5 mg by mouth See admin instructions. Take one tablet by mouth prior to  hemodialysis on MWF 08/13/18  Yes Love, Ivan Anchors, PA-C  Nutritional Supplements (FEEDING SUPPLEMENT, NEPRO CARB STEADY,) LIQD Take 237 mLs by mouth daily.   Yes [provider]  omeprazole (PRILOSEC) 40 MG capsule Take 1 capsule (40 mg total) by mouth daily. 03/13/18  Yes Esterwood, Amy S, PA-C  sevelamer carbonate (RENVELA) 800 MG tablet Take 1,600-2,400 mg by mouth See admin instructions. Take 2,400 mg by mouth three times a day with meals and 1,600 mg with snacks on Sun/Tues/Thurs/Sat and 2,400 mg with breakfast and dinner and nothing with snacks on Mon/Wed/Fri   Yes [provider]  TRADJENTA 5 MG TABS tablet TAKE 1 TABLET DAILY Patient taking differently: Take 5 mg by mouth daily. 03/01/20  Yes Elayne Snare, MD  ketoconazole (NIZORAL) 2 % shampoo Apply 1 application topically 2 (two) times a week. Patient not taking: Reported on 10/02/2021 03/31/15   Elby Showers, MD  lidocaine (LIDODERM) 5 % Place 1 patch onto the skin daily as needed (back pain).  Patient not taking: Reported on 10/02/2021 11/12/19   [provider]  simvastatin (ZOCOR) 20 MG tablet Take 20 mg by mouth at bedtime.    [provider]  Teriparatide, Recombinant, (FORTEO) 600 MCG/2.4ML SOPN Inject 20 mcg subcutaneously daily Patient taking differently: Inject 20 mcg into the skin at bedtime. 09/08/21   Elayne Snare, MD   Current Facility-Administered Medications  Medication Dose Route Frequency Provider Last Rate Last Admin   acetaminophen (TYLENOL) tablet 650 mg  650 mg Oral Q6H PRN Etta Quill, DO   650 mg at 10/02/21 1028   Or   acetaminophen (TYLENOL) suppository 650 mg  650 mg Rectal Q6H PRN Etta Quill, DO       haloperidol lactate (HALDOL) injection 2-4 mg  2-4 mg Intravenous Q6H PRN Etta Quill, DO       insulin aspart (novoLOG) injection 0-9 Units  0-9 Units Subcutaneous Q4H Jennette Kettle M, DO       ondansetron [facility]) tablet 4 mg  4 mg Oral Q6H PRN Etta Quill, DO        Or   ondansetron [facility]) injection 4 mg  4 mg Intravenous Q6H PRN Etta Quill, DO       pantoprazole (PROTONIX) injection 40 mg  40 mg Intravenous Q12H Regalado, Belkys A, MD   40 mg at 10/02/21 1026   Current Outpatient Medications  Medication Sig Dispense Refill   acetaminophen (TYLENOL) 325 MG tablet Take 1-2 tablets (325-650 mg total) by mouth every 4 (four) hours as needed for mild pain.     albuterol (VENTOLIN HFA) 108 (90 Base) MCG/ACT inhaler Inhale 1 puff into the lungs daily as needed for wheezing or shortness of breath.     allopurinol (ZYLOPRIM) 100 MG tablet Take 100 mg by mouth daily.     Blood Glucose Monitoring Suppl (FREESTYLE FREEDOM LITE) w/Device KIT Use to check blood sugar 2 times per day dx code E11.65 1 each 0   camphor-menthol (SARNA) lotion Apply 1 application topically 4 (four) times daily as needed for itching.      Carboxymethylcellulose Sodium (THERATEARS) 0.25 % SOLN Place 1 drop into both eyes as needed (for dryness).     cetirizine (ZYRTEC) 10 MG tablet Take 10 mg by mouth daily as needed for allergies.     clobetasol (TEMOVATE) 0.05 % external solution Apply 1 application topically 2 (two) times daily as needed (scalp irritation.).   0   donepezil (ARICEPT) 10 MG tablet Take 1 tablet (10 mg total) by mouth at bedtime. 90 tablet 0   FLUOCINOLONE ACETONIDE SCALP 0.01 % OIL Apply 1 application topically daily as needed (scalp irritation.).   0   fluticasone (FLONASE) 50 MCG/ACT nasal spray Place 1 spray into both nostrils daily. (Patient taking differently: Place 1 spray into both nostrils daily as needed for allergies.) 16 g 2   glucose blood (FREESTYLE LITE) test strip USE AS DIRECTED THREE TIMES DAILY (Patient taking differently: Check blood sugar bid) 300 strip 2   hydrocortisone 2.5 % cream Apply 1 application topically 3 (three)  times daily as needed (skin irritation (legs & arms)).      Insulin Pen Needle (PEN NEEDLES) 32G X 4 MM MISC 1 each by Does  not apply route 3 (three) times daily. USE PEN NEEDLES TO INJECT INSULIN THREE TIMES DAILY. 100 each 12   Lancets (FREESTYLE) lancets Use as instructed to check blood sugar 2 times per day dx code E11.65 100 each 3   levothyroxine (SYNTHROID, LEVOTHROID) 50 MCG tablet Take 1 tablet (50 mcg total) by mouth daily. (Patient taking differently: Take 50 mcg by mouth daily before breakfast.) 90 tablet 1   memantine (NAMENDA) 10 MG tablet Take 1 tablet (10 mg total) by mouth 2 (two) times daily. 180 tablet 0   midodrine (PROAMATINE) 10 MG tablet Take one pill prior to hemodialysis on MWF (Patient taking differently: Take 5 mg by mouth See admin instructions. Take one tablet by mouth prior to hemodialysis on MWF)     Nutritional Supplements (FEEDING SUPPLEMENT, NEPRO CARB STEADY,) LIQD Take 237 mLs by mouth daily.     omeprazole (PRILOSEC) 40 MG capsule Take 1 capsule (40 mg total) by mouth daily. 30 capsule 0   sevelamer carbonate (RENVELA) 800 MG tablet Take 1,600-2,400 mg by mouth See admin instructions. Take 2,400 mg by mouth three times a day with meals and 1,600 mg with snacks on Sun/Tues/Thurs/Sat and 2,400 mg with breakfast and dinner and nothing with snacks on Mon/Wed/Fri     TRADJENTA 5 MG TABS tablet TAKE 1 TABLET DAILY (Patient taking differently: Take 5 mg by mouth daily.) 90 tablet 3   ketoconazole (NIZORAL) 2 % shampoo Apply 1 application topically 2 (two) times a week. (Patient not taking: Reported on 10/02/2021) 120 mL 0   lidocaine (LIDODERM) 5 % Place 1 patch onto the skin daily as needed (back pain).  (Patient not taking: Reported on 10/02/2021)     simvastatin (ZOCOR) 20 MG tablet Take 20 mg by mouth at bedtime.     Teriparatide, Recombinant, (FORTEO) 600 MCG/2.4ML SOPN Inject 20 mcg subcutaneously daily (Patient taking differently: Inject 20 mcg into the skin at bedtime.) 7.2 mL 1   Labs: Basic Metabolic Panel: Recent Labs  Lab 10/01/21 1845 10/02/21 0002 10/02/21 0505  NA 148* 147*  150*  K 4.5 5.1 5.6*  CL 117*  --  109  CO2 22  --  26  GLUCOSE 87  --  93  BUN 51*  --  63*  CREATININE 7.28*  --  9.30*  CALCIUM 7.8*  --  9.8   Liver Function Tests: Recent Labs  Lab 10/01/21 1845  AST 17  ALT 10  ALKPHOS 199*  BILITOT 0.6  PROT 5.1*  ALBUMIN 2.7*   No results for input(s): LIPASE, AMYLASE in the last 168 hours. Recent Labs  Lab 10/01/21 2107  AMMONIA 37*   CBC: Recent Labs  Lab 10/01/21 1845 10/02/21 0002 10/02/21 0505  WBC 7.2  --  9.6  HGB 10.4* 11.6* 12.2*  HCT 34.5* 34.0* 38.8*  MCV 108.2*  --  105.7*  PLT 121*  --  140*   Cardiac Enzymes: No results for input(s): CKTOTAL, CKMB, CKMBINDEX, TROPONINI in the last 168 hours. CBG: Recent Labs  Lab 10/02/21 0008 10/02/21 0429 10/02/21 0715 10/02/21 1120  GLUCAP 75 88 84 75   Iron Studies: No results for input(s): IRON, TIBC, TRANSFERRIN, FERRITIN in the last 72 hours. Studies/Results: CT ABDOMEN PELVIS WO CONTRAST  Result Date: 10/01/2021 CLINICAL DATA:  Left lower quadrant abdominal pain for  the past week. Altered mental status. EXAM: CT ABDOMEN AND PELVIS WITHOUT CONTRAST TECHNIQUE: Multidetector CT imaging of the abdomen and pelvis was performed following the standard protocol without IV contrast. COMPARISON:  CT abdomen and pelvis dated July 03, 2021. FINDINGS: Lower chest: No acute abnormality. Left lower lobe atelectasis. Resolved pleural effusions. Hepatobiliary: No focal liver abnormality is seen. No gallstones, gallbladder wall thickening, or biliary dilatation. Pancreas: Unremarkable. No pancreatic ductal dilatation or surrounding inflammatory changes. Spleen: Normal in size without focal abnormality. Adrenals/Urinary Tract: The adrenal glands are unremarkable. Unchanged bilateral renal atrophy and cysts. No renal calculi or hydronephrosis. The bladder is decompressed. Stomach/Bowel: Stomach is within normal limits. Appendix appears normal. No evidence of bowel wall thickening,  distention, or inflammatory changes. Vascular/Lymphatic: Prior aortobifemoral bypass graft. Aortic atherosclerosis. No enlarged abdominal or pelvic lymph nodes. Reproductive: Prostate is unremarkable. Other: Unchanged small fat containing left inguinal hernia. No free fluid or pneumoperitoneum. Musculoskeletal: No acute or significant osseous findings. Unchanged chronic thoracolumbar compression deformities. Chronic partially united right intertrochanteric femur fracture status post ORIF. IMPRESSION: 1. No acute intra-abdominal process. Electronically Signed   By: Titus Dubin M.D.   On: 10/01/2021 21:42   CT Head Wo Contrast  Result Date: 10/01/2021 CLINICAL DATA:  Mental status change, unknown cause Worsening confusion. EXAM: CT HEAD WITHOUT CONTRAST TECHNIQUE: Contiguous axial images were obtained from the base of the skull through the vertex without intravenous contrast. COMPARISON:  Head CT 11/07/2020 FINDINGS: Brain: Chronic bifrontal hygromas, left greater than right, not significantly changed from prior exam. This measures up to 10 mm in the left in the frontal region, measured on series 4, image 10. Left hygroma is minimally increased in density compared to CSF, but stable from prior exam. No hyperdense component to suggest acute hemorrhage. Generalized atrophy and chronic small vessel ischemia which is stable. No evidence of acute infarct. No midline shift or mass effect. Vascular: Atherosclerosis of skullbase vasculature without hyperdense vessel or abnormal calcification. Skull: Bilateral frontal burr holes.  No fracture or focal lesion. Sinuses/Orbits: Paranasal sinuses and mastoid air cells are clear. The visualized orbits are unremarkable. Bilateral cataract resection. Other: None. IMPRESSION: 1. No acute intracranial abnormality. 2. Chronic bifrontal hygromas, left greater than right, not significantly changed from prior exam. No hyperdense component to suggest acute hemorrhage. 3. Stable  atrophy and chronic small vessel ischemia. Electronically Signed   By: Keith Rake M.D.   On: 10/01/2021 20:17   DG Chest Portable 1 View  Result Date: 10/01/2021 CLINICAL DATA:  Altered mental status. EXAM: PORTABLE CHEST 1 VIEW COMPARISON:  Radiograph 07/03/2021 FINDINGS: Right-sided dialysis catheter in place. Chronic cardiomegaly with equivocal increase. Aortic atherosclerosis and tortuosity, stable. Slight increase in interstitial thickening from prior exam. Slight blunting of both costophrenic angle suggests small effusions. No confluent consolidation. No pneumothorax. No acute osseous abnormalities are seen. IMPRESSION: Chronic cardiomegaly with equivocal increase. Slight progression in interstitial thickening from prior exam, pulmonary edema versus atypical infection. Possible small pleural effusions. Electronically Signed   By: Keith Rake M.D.   On: 10/01/2021 19:29    ROS: As per HPI otherwise negative.  Physical Exam: Vitals:   10/02/21 1233 10/02/21 1245 10/02/21 1315 10/02/21 1430  BP: (!) 113/100 130/82 (!) 172/86 (!) 148/81  Pulse: 67 66 73 73  Resp: _0 Temp:      TempSrc:      SpO2: 100% 100% 100% 100%  Weight:      Height:  General: Pleasantly confused elderly male in NAD Head: Normocephalic, atraumatic, sclera non-icteric, mucus membranes are moist Neck: Supple. JVD not elevated. Lungs: Bilateral breath sounds with few basilar crackles. No WOB.  Heart: RRR with S1 S2. No murmurs, rubs, or gallops appreciated. Abdomen: Soft, non-tender, non-distended with normoactive bowel sounds. No rebound/guarding. No obvious abdominal masses. M-S:  Strength and tone appear normal for age. Lower extremities:without edema or ischemic changes, no open wounds  Neuro: Alert and oriented X 3. Moves all extremities spontaneously. Psych:  Responds to questions appropriately with a normal affect. Dialysis Access: R AVF + T/B. RIJ TDC drsg intact.   Dialysis  Orders:[facility] M[facility]  4 hrs 180NRE 400/Autoflow 1.5 64.5 kg 3.0K/2.5 Ca UFP 2 AVF -No heparin  -No ESA/Venofer -Hectorol 3 mcg IV TIW   Assessment/Plan:   Acute Encephalopathy in setting of pre-existing dementia. Recently started on Korsuva. Will DC drug at OP center. Difficult to distinguish uremia from dementia but no Asterixis. SCr 9.3 BUN 63. No missed treatments but has truncated a few.   Mild hyperkalemia: Has been elevated at OP center on 3.0 K bath. Changed to 2.0 K bath. HD today on schedule.  Hypernatremia: Na 150 today. Initially thought to be due to poor PO intake but wife says he eats ice constantly. Should correct with HD.   ESRD -  MWF. HD later this PM. Next HD 10/04/2021  Hypertension/volume  - BP elevated in ED but being taken on extremity with active AVF. No BP meds on OP med list. Usually takes midodrine with HD. Hold midodrine for now. UF as tolerated.   Anemia  - HGB 12.2. No ESA as OP. Follow HGB.   Metabolic bone disease -  Last OP labs at goal 09/27/2021. Continue renvela binders, VDRA. Sensipar on OP med list, not on home med list. Will continue here.  Nutrition -Albumin low. Renal/Carb Mod diet. Start protein supps, renal vit. DMT2-per primary  Jimmye Norman. Owens Shark, NP-C 10/02/2021, 3:38 PM  [employer] [phone]

## 2021-10-02 NOTE — Progress Notes (Addendum)
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  Patient has had poor oral intake over the past couple of months due to dementia as described in palliative care notes.  Presented to the ED with worsening confusion that baseline for about 1 week.  Patient complaining of abdominal pain and suprapubic pain.  T abdomen and pelvis was negative for acute finding.   Assessment & Plan:   Principal Problem:   Acute encephalopathy Active Problems:   End stage renal disease (HCC)   Alzheimer's disease, unspecified (CODE) (HCC)   Hypernatremia  1-Acute Metabolic Encephalopathy: Per notes patient appears to be started on a  new anti itching medication , 1 week ago, with presume opioid properties. If no improvement with subsequent hemodialysis might need MRI -CT head: Acute intracranial abnormality.  Chronic bifrontal hygroma stable -Ammonia: 37, not significantly elevated -ABG very mild hypoxemia.  Patient will be placed on oxygen supplementation As needed Haldol TSH 0.2; check free T 3 and T 4.  -Check B 12.   2-Hypernatremia: Suspect related to poor oral intake Per Nephrologist, hold on IV fluids at the moment  3-ESRD: On hemodialysis.  Nephrology has been consulted  4-Alzheimer's disease 5-Abdominal pain: We will start IV PPI twice daily Abdomen and pelvis negative for acute abnormalities Bladder scan.  Discussed with wife, he has been complaing of supra-pubic pain. Plan to order In and ou and UA, urine culture.   Estimated body mass index is 21.06 kg/m as calculated from the following:   Height as of this encounter: 5\' 11"  (1.803 m).   Weight as of this encounter: 68.5 kg.   DVT prophylaxis: SCD Code Status: DNR Family Communication:Wife at bedside Disposition Plan:  Status is:  Observation  The patient remains OBS appropriate and will d/c before 2 midnights.       Consultants:  Nephrology   Procedures:  HD  Antimicrobials:    Subjective: Alert, confused, he is complaining of abdominal pain.  Objective: Vitals:   10/02/21 0549 10/02/21 0600 10/02/21 0715 10/02/21 0730  BP: 130/68 (!) 137/94 (!) 162/88 (!) 152/78  Pulse: 82 75    Resp: 14 18 14  (!) 22  Temp:      TempSrc:      SpO2: 100% 100%    Weight:      Height:       No intake or output data in the 24 hours ending 10/02/21 0823 Filed Weights   10/01/21 1822  Weight: 68.5 kg    Examination:  General exam: Appears calm and comfortable  Respiratory system: Clear to auscultation. Respiratory effort normal. Cardiovascular system: S1 & S2 heard, RRR.  Gastrointestinal system: Abdomen is nondistended, soft and nontender. No organomegaly or masses felt. Normal bowel sounds heard. Central nervous system: Alert confused, moving passively extremities Extremities: no edema Data Reviewed: I have personally reviewed following labs and imaging studies  CBC: Recent Labs  Lab 10/01/21 1845 10/02/21 0002 10/02/21 0505  WBC 7.2  --  9.6  HGB 10.4* 11.6* 12.2*  HCT 34.5* 34.0* 38.8*  MCV 108.2*  --  105.7*  PLT 121*  --  858*   Basic Metabolic Panel: Recent Labs  Lab 10/01/21 1845 10/02/21 0002 10/02/21 0505  NA 148* 147* 150*  K 4.5 5.1 5.6*  CL 117*  --  109  CO2  22  --  26  GLUCOSE 87  --  93  BUN 51*  --  63*  CREATININE 7.28*  --  9.30*  CALCIUM 7.8*  --  9.8   GFR: Estimated Creatinine Clearance: 6.1 mL/min (A) (by C-G formula based on SCr of 9.3 mg/dL (H)). Liver Function Tests: Recent Labs  Lab 10/01/21 1845  AST 17  ALT 10  ALKPHOS 199*  BILITOT 0.6  PROT 5.1*  ALBUMIN 2.7*   No results for input(s): LIPASE, AMYLASE in the last 168 hours. Recent Labs  Lab 10/01/21 2107  AMMONIA 37*   Coagulation Profile: No results for input(s): INR, PROTIME in the last  168 hours. Cardiac Enzymes: No results for input(s): CKTOTAL, CKMB, CKMBINDEX, TROPONINI in the last 168 hours. BNP (last 3 results) No results for input(s): PROBNP in the last 8760 hours. HbA1C: No results for input(s): HGBA1C in the last 72 hours. CBG: Recent Labs  Lab 10/02/21 0008 10/02/21 0429 10/02/21 0715  GLUCAP 75 88 84   Lipid Profile: No results for input(s): CHOL, HDL, LDLCALC, TRIG, CHOLHDL, LDLDIRECT in the last 72 hours. Thyroid Function Tests: Recent Labs    10/02/21 0505  TSH 0.241*   Anemia Panel: No results for input(s): VITAMINB12, FOLATE, FERRITIN, TIBC, IRON, RETICCTPCT in the last 72 hours. Sepsis Labs: No results for input(s): PROCALCITON, LATICACIDVEN in the last 168 hours.  Recent Results (from the past 240 hour(s))  Resp Panel by RT-PCR (Flu A&B, Covid) Nasopharyngeal Swab     Status: None   Collection Time: 10/01/21  6:45 PM   Specimen: Nasopharyngeal Swab; Nasopharyngeal(NP) swabs in vial transport medium  Result Value Ref Range Status   SARS Coronavirus 2 by RT PCR NEGATIVE NEGATIVE Final    Comment: (NOTE) SARS-CoV-2 target nucleic acids are NOT DETECTED.  The SARS-CoV-2 RNA is generally detectable in upper respiratory specimens during the acute phase of infection. The lowest concentration of SARS-CoV-2 viral copies this assay can detect is 138 copies/mL. A negative result does not preclude SARS-Cov-2 infection and should not be used as the sole basis for treatment or other patient management decisions. A negative result may occur with  improper specimen collection/handling, submission of specimen other than nasopharyngeal swab, presence of viral mutation(s) within the areas targeted by this assay, and inadequate number of viral copies(<138 copies/mL). A negative result must be combined with clinical observations, patient history, and epidemiological information. The expected result is Negative.  Fact Sheet for Patients:   EntrepreneurPulse.com.au  Fact Sheet for Healthcare Providers:  IncredibleEmployment.be  This test is no t yet approved or cleared by the [place] FDA and  has been authorized for detection and/or diagnosis of SARS-CoV-2 by FDA under an Emergency Use Authorization (EUA). This EUA will remain  in effect (meaning this test can be used) for the duration of the COVID-19 declaration under Section 564(b)(1) of the Act, 21 U.S.C.section 360bbb-3(b)(1), unless the authorization is terminated  or revoked sooner.       Influenza A by PCR NEGATIVE NEGATIVE Final   Influenza B by PCR NEGATIVE NEGATIVE Final    Comment: (NOTE) The Xpert Xpress SARS-CoV-2/FLU/RSV plus assay is intended as an aid in the diagnosis of influenza from Nasopharyngeal swab specimens and should not be used as a sole basis for treatment. Nasal washings and aspirates are unacceptable for Xpert Xpress SARS-CoV-2/FLU/RSV testing.  Fact Sheet for Patients: EntrepreneurPulse.com.au  Fact Sheet for Healthcare Providers: IncredibleEmployment.be  This test is not yet approved or cleared by the [place]  States FDA and has been authorized for detection and/or diagnosis of SARS-CoV-2 by FDA under an Emergency Use Authorization (EUA). This EUA will remain in effect (meaning this test can be used) for the duration of the COVID-19 declaration under Section 564(b)(1) of the Act, 21 U.S.C. section 360bbb-3(b)(1), unless the authorization is terminated or revoked.  Performed at [facility], [place] [address], [place], [place] [address]          Radiology Studies: CT ABDOMEN PELVIS WO CONTRAST  Result Date: 10/01/2021 CLINICAL DATA:  Left lower quadrant abdominal pain for the past week. Altered mental status. EXAM: CT ABDOMEN AND PELVIS WITHOUT CONTRAST TECHNIQUE: Multidetector CT imaging of the abdomen and pelvis was performed following the  standard protocol without IV contrast. COMPARISON:  CT abdomen and pelvis dated July 03, 2021. FINDINGS: Lower chest: No acute abnormality. Left lower lobe atelectasis. Resolved pleural effusions. Hepatobiliary: No focal liver abnormality is seen. No gallstones, gallbladder wall thickening, or biliary dilatation. Pancreas: Unremarkable. No pancreatic ductal dilatation or surrounding inflammatory changes. Spleen: Normal in size without focal abnormality. Adrenals/Urinary Tract: The adrenal glands are unremarkable. Unchanged bilateral renal atrophy and cysts. No renal calculi or hydronephrosis. The bladder is decompressed. Stomach/Bowel: Stomach is within normal limits. Appendix appears normal. No evidence of bowel wall thickening, distention, or inflammatory changes. Vascular/Lymphatic: Prior aortobifemoral bypass graft. Aortic atherosclerosis. No enlarged abdominal or pelvic lymph nodes. Reproductive: Prostate is unremarkable. Other: Unchanged small fat containing left inguinal hernia. No free fluid or pneumoperitoneum. Musculoskeletal: No acute or significant osseous findings. Unchanged chronic thoracolumbar compression deformities. Chronic partially united right intertrochanteric femur fracture status post ORIF. IMPRESSION: 1. No acute intra-abdominal process. Electronically Signed   By: Titus Dubin M.D.   On: 10/01/2021 21:42   CT Head Wo Contrast  Result Date: 10/01/2021 CLINICAL DATA:  Mental status change, unknown cause Worsening confusion. EXAM: CT HEAD WITHOUT CONTRAST TECHNIQUE: Contiguous axial images were obtained from the base of the skull through the vertex without intravenous contrast. COMPARISON:  Head CT 11/07/2020 FINDINGS: Brain: Chronic bifrontal hygromas, left greater than right, not significantly changed from prior exam. This measures up to 10 mm in the left in the frontal region, measured on series 4, image 10. Left hygroma is minimally increased in density compared to CSF, but stable  from prior exam. No hyperdense component to suggest acute hemorrhage. Generalized atrophy and chronic small vessel ischemia which is stable. No evidence of acute infarct. No midline shift or mass effect. Vascular: Atherosclerosis of skullbase vasculature without hyperdense vessel or abnormal calcification. Skull: Bilateral frontal burr holes.  No fracture or focal lesion. Sinuses/Orbits: Paranasal sinuses and mastoid air cells are clear. The visualized orbits are unremarkable. Bilateral cataract resection. Other: None. IMPRESSION: 1. No acute intracranial abnormality. 2. Chronic bifrontal hygromas, left greater than right, not significantly changed from prior exam. No hyperdense component to suggest acute hemorrhage. 3. Stable atrophy and chronic small vessel ischemia. Electronically Signed   By: Keith Rake M.D.   On: 10/01/2021 20:17   DG Chest Portable 1 View  Result Date: 10/01/2021 CLINICAL DATA:  Altered mental status. EXAM: PORTABLE CHEST 1 VIEW COMPARISON:  Radiograph 07/03/2021 FINDINGS: Right-sided dialysis catheter in place. Chronic cardiomegaly with equivocal increase. Aortic atherosclerosis and tortuosity, stable. Slight increase in interstitial thickening from prior exam. Slight blunting of both costophrenic angle suggests small effusions. No confluent consolidation. No pneumothorax. No acute osseous abnormalities are seen. IMPRESSION: Chronic cardiomegaly with equivocal increase. Slight progression in interstitial thickening from prior exam,  pulmonary edema versus atypical infection. Possible small pleural effusions. Electronically Signed   By: Keith Rake M.D.   On: 10/01/2021 19:29        Scheduled Meds:  insulin aspart  0-9 Units Subcutaneous Q4H   Continuous Infusions:   LOS: 0 days    Time spent: 35 minutes    Sherilee Smotherman A Marializ Ferrebee, MD [facility]   If 7PM-7AM, please contact night-coverage [email or web address]  10/02/2021, 8:23 AM

## 2021-10-02 NOTE — H&P (Signed)
History and Physical    [name] ZHG:[id number] DOB: Sep 05, 1941 DOA: 10/01/2021  PCP: Elby Showers, MD  Patient coming from: Home  I have personally briefly reviewed patient's old medical records in [place]  Chief Complaint: AMS  HPI: [name] is a 81 y.o. male with medical history significant of DM2, HTN, ESRD on MWF dialysis, dementia.  Pt with poor PO intake over past couple of months due to dementia as described in [facility] care Oct note.  Pt presents to ED with confusion worse than baseline onset about 1 week ago.  Persistent, nothing makes better or worse.  Some abd / suprapubic pain for past week.  Doesn't make urine anymore.  No falls per wife.  Pt denies CP, SOB, bed sores or back pain, foot ulcers.   ED Course: CT AP = neg.  Lab work remarkable mostly for sodium 148 and Cl 117.   Past Medical History:  Diagnosis Date   Allergy    Alzheimer's disease ([place]) 01/19/2021   Anemia    Arthritis    Cataract    bil cateracts removed   Coronary artery disease    Dementia arising in the senium and presenium ([place])    Diabetes mellitus    Type 2   Diverticulitis    ED (erectile dysfunction)    Elevated homocysteine    ESRD (end stage renal disease) on dialysis ([place]) 03/2015   M-W-F dialysis   GERD (gastroesophageal reflux disease)    pepto    Gout    Hiatal hernia    Hyperlipidemia    Hypertension    Hypothyroidism    PVD (peripheral vascular disease) ([place])    has plastic aorta   Renal insufficiency    Sleep apnea    does not wear c-pap    Past Surgical History:  Procedure Laterality Date   ANGIOPLASTY Right 01/26/2021   Procedure: ANGIOPLASTY RIGHT SUBCLAVIAN VEIN;  Surgeon: Serafina Mitchell, MD;  Location: [place];  Service: Vascular;  Laterality: Right;   aortobifemoral bypass     AV FISTULA PLACEMENT Left 12/01/2013   Procedure: ARTERIOVENOUS (AV) FISTULA CREATION- LEFT BRACHIOCEPHALIC;  Surgeon: Angelia Mould, MD;  Location: [place];  Service: Vascular;  Laterality: Left;   [place] Right 07/27/2014   Procedure: BASCILIC VEIN TRANSPOSITION;  Surgeon: Angelia Mould, MD;  Location: [place];  Service: Vascular;  Laterality: Right;   BRAIN SURGERY  07/22/2018   BREAST SURGERY     left - granulomatous mastitis   BURR HOLE Bilateral 07/22/2018   Procedure: BILATERAL BURR HOLES;  Surgeon: Kristeen Miss, MD;  Location: [place];  Service: Neurosurgery;  Laterality: Bilateral;   COLONOSCOPY     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 ([place] [facility])  1992   ENTEROSCOPY N/A 06/03/2018   Procedure: ENTEROSCOPY;  Surgeon: Lavena Bullion, DO;  [facility];  Service: Gastroenterology;  Laterality: N/A;   ENTEROSCOPY N/A 11/20/2018   Procedure: ENTEROSCOPY;  Surgeon: Rush Landmark Telford Nab., MD;  Location: [place];  Service: Gastroenterology;  Laterality: N/A;   ENTEROSCOPY N/A 12/03/2019   Procedure: ENTEROSCOPY;  Surgeon: Lavena Bullion, DO;  Location: [facility] ENDOSCOPY;  Service: Gastroenterology;  Laterality: N/A;  push enteroscopy   ENTEROSCOPY N/A 07/04/2021   Procedure: ENTEROSCOPY;  Surgeon: Sharyn Creamer, MD;  Location: [facility] ENDOSCOPY;  Service: Gastroenterology;  Laterality: N/A;   EYE SURGERY Bilateral    cataracts   FISTULOGRAM Right 01/26/2021   Procedure:  FISTULOGRAM RIGHT;  Surgeon: Serafina Mitchell, MD;  Location: [facility] OR;  Service: Vascular;  Laterality: Right;   HEMODIALYSIS INPATIENT  01/17/2018       HOT HEMOSTASIS N/A 06/03/2018   Procedure: HOT HEMOSTASIS (ARGON PLASMA COAGULATION/BICAP);  Surgeon: Lavena Bullion, DO;  [facility] ENDOSCOPY;  Service: Gastroenterology;  Laterality: N/A;   HOT HEMOSTASIS N/A 11/20/2018   Procedure: HOT HEMOSTASIS (ARGON PLASMA COAGULATION/BICAP);  Surgeon: Irving Copas., MD;  Location: [place];  Service: Gastroenterology;  Laterality: N/A;   HOT HEMOSTASIS N/A 12/03/2019   Procedure: HOT HEMOSTASIS (ARGON PLASMA COAGULATION/BICAP);   Surgeon: Lavena Bullion, DO;  Location: [facility] ENDOS[facility];  Service: Gastroenterology;  Laterality: N/A;   INTRAMEDULLARY (IM) NAIL INTERTROCHANTERIC Right 11/08/2020   Procedure: INTRAMEDULLARY (IM) NAIL INTERTROCHANTRIC;  Surgeon: Erle Crocker, MD;  Location: [place];  Service: Orthopedics;  Laterality: Right;   REVISION OF ARTERIOVENOUS GORETEX GRAFT Right 09/01/2019   Procedure: REVISION OF ARTERIOVENOUS FISTULA RIGHT ARM;  Surgeon: Serafina Mitchell, MD;  Location: [place];  Service: Vascular;  Laterality: Right;   REVISON OF ARTERIOVENOUS FISTULA Left 02/09/2014   Procedure: REVISON OF LEFT ARTERIOVENOUS FISTULA - RESECTION OF RENDUNDANT VEIN;  Surgeon: Angelia Mould, MD;  Location: [place];  Service: Vascular;  Laterality: Left;   REVISON OF ARTERIOVENOUS FISTULA Right 01/26/2021   Procedure: REVISON OF ARTERIOVENOUS FISTULA RIGHT;  Surgeon: Serafina Mitchell, MD;  Location: [place];  Service: Vascular;  Laterality: Right;   SBO with lysis adhesions     SHUNTOGRAM Left 04/19/2014   Procedure: FISTULOGRAM;  Surgeon: Angelia Mould, MD;  Location: [facility] C[facility] LAB;  Service: Cardiovascular;  Laterality: Left;   UNILATERAL UPPER EXTREMEITY ANGIOGRAM N/A 07/12/2014   Procedure: UNILATERAL UPPER Anselmo Rod;  Surgeon: Angelia Mould, MD;  Location: [facility] CATH LAB;  Service: Cardiovascular;  Laterality: N/A;     reports that he quit smoking about 27 years ago. His smoking use included cigarettes. He has never used smokeless tobacco. He reports that he does not drink alcohol and does not use drugs.  Allergies  Allergen Reactions   Ambien [Zolpidem Tartrate] Other (See Comments)    Hallucinations and "felt crazy"    Penicillins Rash and Hives    Has patient had a PCN reaction causing immediate rash, facial/tongue/throat swelling, SOB or lightheadedness with hypotension: Yes Has patient had a PCN reaction causing severe rash involving mucus membranes or skin necrosis: Yes Has  patient had a PCN reaction that required hospitalization: No Has patient had a PCN reaction occurring within the last 10 years: No If all of the above answers are "NO", then may proceed with Cephalosporin use.  Other reaction(s): HIVES    Family History  Problem Relation Age of Onset   Aneurysm Mother        brain   Heart disease Father    Stroke Father    Hypertension Father    Diabetes Father    Dementia Neg Hx    Colon cancer Neg Hx    Esophageal cancer Neg Hx    Pancreatic cancer Neg Hx    Prostate cancer Neg Hx    Rectal cancer Neg Hx    Stomach cancer Neg Hx     Prior to Admission medications   Medication Sig Start Date End Date Taking? Authorizing Provider  acetaminophen (TYLENOL) 325 MG tablet Take 1-2 tablets (325-650 mg total) by mouth every 4 (four) hours as needed for mild pain. 08/01/18   Love, Ivan Anchors,  PA-C  albuterol (VENTOLIN HFA) 108 (90 Base) MCG/ACT inhaler Inhale 1 puff into the lungs daily as needed for wheezing or shortness of breath.    [provider]  allopurinol (ZYLOPRIM) 100 MG tablet Take 100 mg by mouth daily.    [provider]  Blood Glucose Monitoring Suppl (FREESTYLE FREEDOM LITE) w/Device KIT Use to check blood sugar 2 times per day dx code E11.65 10/21/18   Elayne Snare, MD  camphor-menthol [facility]) lotion Apply 1 application topically 4 (four) times daily as needed for itching.     [provider]  Carboxymethylcellulose Sodium (THERATEARS) 0.25 % SOLN Place 1 drop into both eyes as needed (for dryness).    [provider]  cetirizine (ZYRTEC) 10 MG chewable tablet Chew 10 mg by mouth every Monday, Wednesday, and Friday.    [provider]  clobetasol (TEMOVATE) 0.05 % external solution Apply 1 application topically 2 (two) times daily as needed (scalp irritation.).  04/19/15   [provider]  donepezil (ARICEPT) 10 MG tablet Take 1 tablet (10 mg total) by mouth at bedtime. 09/26/21   Melvenia Beam, MD  FLUOCINOLONE ACETONIDE SCALP 0.01 % OIL Apply 1 application topically daily as needed (scalp irritation.).  04/28/15   [provider]  fluticasone (FLONASE) 50 MCG/ACT nasal spray Place 1 spray into both nostrils daily. 10/08/14   Mikhail, Velta Addison, DO  glucose blood (FREESTYLE LITE) test strip USE AS DIRECTED THREE TIMES DAILY 01/16/21   Elayne Snare, MD  hydrocortisone 2.5 % cream Apply 1 application topically 3 (three) times daily as needed (skin irritation (legs & arms)).  01/13/19   [provider]  Insulin Pen Needle (PEN NEEDLES) 32G X 4 MM MISC 1 each by Does not apply route 3 (three) times daily. USE PEN NEEDLES TO INJECT INSULIN THREE TIMES DAILY. 11/25/18   Elayne Snare, MD  ketoconazole (NIZORAL) 2 % shampoo Apply 1 application topically 2 (two) times a week. Patient taking differently: Apply 1 application topically 2 (two) times a week. Sundays, and any other day 03/31/15   Elby Showers, MD  Lancets (FREESTYLE) lancets Use as instructed to check blood sugar 2 times per day dx code E11.65 02/22/16   Elayne Snare, MD  levothyroxine (SYNTHROID, LEVOTHROID) 50 MCG tablet Take 1 tablet (50 mcg total) by mouth daily. Patient taking differently: Take 50 mcg by mouth daily before breakfast. 08/06/14   Baxley, Cresenciano Lick, MD  lidocaine (LIDODERM) 5 % Place 1 patch onto the skin daily as needed (back pain).  11/12/19   [provider]  memantine (NAMENDA) 10 MG tablet Take 1 tablet (10 mg total) by mouth 2 (two) times daily. 09/26/21   Melvenia Beam, MD  midodrine (PROAMATINE) 10 MG tablet Take one pill prior to hemodialysis on MWF Patient taking differently: Take 5 mg by mouth See admin instructions. Take one tablet by mouth prior to hemodialysis on MWF 08/13/18   Love, Ivan Anchors, PA-C  Nutritional Supplements (FEEDING SUPPLEMENT, NEPRO CARB STEADY,) LIQD Take 237 mLs by mouth in the morning and at bedtime.     [provider]  omeprazole (PRILOSEC) 40 MG  capsule Take 1 capsule (40 mg total) by mouth daily. 03/13/18   Esterwood, Amy S, PA-C  sevelamer carbonate (RENVELA) 800 MG tablet Take 1,600-2,400 mg by mouth See admin instructions. Take 2,400 mg by mouth three times a day with meals and 1,600 mg with snacks on Sun/Tues/Thurs/Sat and 2,400 mg with breakfast and dinner and  nothing with snacks on Mon/Wed/Fri    [provider]  simvastatin (ZOCOR) 20 MG tablet Take 20 mg by mouth at bedtime.    [provider]  Teriparatide, Recombinant, (FORTEO) 600 MCG/2.4ML SOPN Inject 20 mcg subcutaneously daily 09/08/21   Elayne Snare, MD  TRADJENTA 5 MG TABS tablet TAKE 1 TABLET DAILY 03/01/20   Elayne Snare, MD    Physical Exam: Vitals:   10/01/21 1815 10/01/21 1822 10/01/21 1824 10/01/21 2148  BP:   (!) 123/111 (!) 127/47  Pulse:   74 72  Resp:   16 14  Temp:   98.2 F (36.8 C)   TempSrc:   Oral   SpO2: 98%  98% 98%  Weight:  68.5 kg    Height:  5' 11" (1.803 m)      Constitutional: Awake, alert, confused Eyes: PERRL Respiratory: clear to auscultation bilaterally.  Normal respiratory effort. No accessory muscle use.  Cardiovascular: Regular rate and rhythm, no murmurs / rubs / gallops. 1+ BLE extremity edema. 2+ pedal pulses. No carotid bruits.  Abdomen: Mild suprapubic TTP Skin: No foot ulcers Psychiatric: Pt cooperative with me, but confused.    Labs on Admission: I have personally reviewed following labs and imaging studies  CBC: Recent Labs  Lab 10/01/21 1845 10/02/21 0002  WBC 7.2  --   HGB 10.4* 11.6*  HCT 34.5* 34.0*  MCV 108.2*  --   PLT 121*  --    Basic Metabolic Panel: Recent Labs  Lab 10/01/21 1845 10/02/21 0002  NA 148* 147*  K 4.5 5.1  CL 117*  --   CO2 22  --   GLUCOSE 87  --   BUN 51*  --   CREATININE 7.28*  --   CALCIUM 7.8*  --    GFR: Estimated Creatinine Clearance: 7.8 mL/min (A) (by C-G formula based on SCr of 7.28 mg/dL (H)). Liver Function Tests: Recent Labs  Lab 10/01/21 1845   AST 17  ALT 10  ALKPHOS 199*  BILITOT 0.6  PROT 5.1*  ALBUMIN 2.7*   No results for input(s): LIPASE, AMYLASE in the last 168 hours. Recent Labs  Lab 10/01/21 2107  AMMONIA 37*   Coagulation Profile: No results for input(s): INR, PROTIME in the last 168 hours. Cardiac Enzymes: No results for input(s): CKTOTAL, CKMB, CKMBINDEX, TROPONINI in the last 168 hours. BNP (last 3 results) No results for input(s): PROBNP in the last 8760 hours. HbA1C: No results for input(s): HGBA1C in the last 72 hours. CBG: Recent Labs  Lab 10/02/21 0008  GLUCAP 75   Lipid Profile: No results for input(s): CHOL, HDL, LDLCALC, TRIG, CHOLHDL, LDLDIRECT in the last 72 hours. Thyroid Function Tests: No results for input(s): TSH, T4TOTAL, FREET4, T3FREE, THYROIDAB in the last 72 hours. Anemia Panel: No results for input(s): VITAMINB12, FOLATE, FERRITIN, TIBC, IRON, RETICCTPCT in the last 72 hours. Urine analysis:    Component Value Date/Time   COLORURINE YELLOW 07/21/2018 1044   APPEARANCEUR CLOUDY (A) 07/21/2018 1044   LABSPEC 1.009 07/21/2018 1044   PHURINE 9.0 (H) 07/21/2018 1044   GLUCOSEU NEGATIVE 07/21/2018 1044   HGBUR NEGATIVE 07/21/2018 1044   BILIRUBINUR NEGATIVE 07/21/2018 1044   BILIRUBINUR neg 02/14/2015 1115   KETONESUR NEGATIVE 07/21/2018 1044   PROTEINUR 100 (A) 07/21/2018 1044   UROBILINOGEN 0.2 02/20/2015 0944   NITRITE NEGATIVE 07/21/2018 1044   LEUKOCYTESUR SMALL (A) 07/21/2018 1044    Radiological Exams on Admission: CT ABDOMEN PELVIS WO CONTRAST  Result Date: 10/01/2021 CLINICAL DATA:  Left lower quadrant abdominal pain for the past week. Altered mental status. EXAM: CT ABDOMEN AND PELVIS WITHOUT CONTRAST TECHNIQUE: Multidetector CT imaging of the abdomen and pelvis was performed following the standard protocol without IV contrast. COMPARISON:  CT abdomen and pelvis dated July 03, 2021. FINDINGS: Lower chest: No acute abnormality. Left lower lobe atelectasis.  Resolved pleural effusions. Hepatobiliary: No focal liver abnormality is seen. No gallstones, gallbladder wall thickening, or biliary dilatation. Pancreas: Unremarkable. No pancreatic ductal dilatation or surrounding inflammatory changes. Spleen: Normal in size without focal abnormality. Adrenals/Urinary Tract: The adrenal glands are unremarkable. Unchanged bilateral renal atrophy and cysts. No renal calculi or hydronephrosis. The bladder is decompressed. Stomach/Bowel: Stomach is within normal limits. Appendix appears normal. No evidence of bowel wall thickening, distention, or inflammatory changes. Vascular/Lymphatic: Prior aortobifemoral bypass graft. Aortic atherosclerosis. No enlarged abdominal or pelvic lymph nodes. Reproductive: Prostate is unremarkable. Other: Unchanged small fat containing left inguinal hernia. No free fluid or pneumoperitoneum. Musculoskeletal: No acute or significant osseous findings. Unchanged chronic thoracolumbar compression deformities. Chronic partially united right intertrochanteric femur fracture status post ORIF. IMPRESSION: 1. No acute intra-abdominal process. Electronically Signed   By: Titus Dubin M.D.   On: 10/01/2021 21:42   CT Head Wo Contrast  Result Date: 10/01/2021 CLINICAL DATA:  Mental status change, unknown cause Worsening confusion. EXAM: CT HEAD WITHOUT CONTRAST TECHNIQUE: Contiguous axial images were obtained from the base of the skull through the vertex without intravenous contrast. COMPARISON:  Head CT 11/07/2020 FINDINGS: Brain: Chronic bifrontal hygromas, left greater than right, not significantly changed from prior exam. This measures up to 10 mm in the left in the frontal region, measured on series 4, image 10. Left hygroma is minimally increased in density compared to CSF, but stable from prior exam. No hyperdense component to suggest acute hemorrhage. Generalized atrophy and chronic small vessel ischemia which is stable. No evidence of acute infarct.  No midline shift or mass effect. Vascular: Atherosclerosis of skullbase vasculature without hyperdense vessel or abnormal calcification. Skull: Bilateral frontal burr holes.  No fracture or focal lesion. Sinuses/Orbits: Paranasal sinuses and mastoid air cells are clear. The visualized orbits are unremarkable. Bilateral cataract resection. Other: None. IMPRESSION: 1. No acute intracranial abnormality. 2. Chronic bifrontal hygromas, left greater than right, not significantly changed from prior exam. No hyperdense component to suggest acute hemorrhage. 3. Stable atrophy and chronic small vessel ischemia. Electronically Signed   By: Keith Rake M.D.   On: 10/01/2021 20:17   DG Chest Portable 1 View  Result Date: 10/01/2021 CLINICAL DATA:  Altered mental status. EXAM: PORTABLE CHEST 1 VIEW COMPARISON:  Radiograph 07/03/2021 FINDINGS: Right-sided dialysis catheter in place. Chronic cardiomegaly with equivocal increase. Aortic atherosclerosis and tortuosity, stable. Slight increase in interstitial thickening from prior exam. Slight blunting of both costophrenic angle suggests small effusions. No confluent consolidation. No pneumothorax. No acute osseous abnormalities are seen. IMPRESSION: Chronic cardiomegaly with equivocal increase. Slight progression in interstitial thickening from prior exam, pulmonary edema versus atypical infection. Possible small pleural effusions. Electronically Signed   By: Keith Rake M.D.   On: 10/01/2021 19:29    EKG: Independently reviewed.  Assessment/Plan Principal Problem:   Acute encephalopathy Active Problems:   End stage renal disease (HCC)   Alzheimer's disease, unspecified (CODE) (HCC)   Hypernatremia    Acute encephalopathy - Was wondering if it might be dehydration, spoke with Dr. Joelyn Oms about this.  Dr. Joelyn Oms actually suspects it might be medication induced: Apparently pt started on a new anti itching  med that has opoid properties x1 week ago This  would line up with the timing of his confusion. Neg or unimpressive work up thus far - CBC (chronic anemia of CKD and chronic mild thrombocytopenia) CT head Ammonia CMP (other than hypernatremia) ABG COVID and flu Haldol PRN agitation Hold the new medication If not improving - consider MRI brain, though not sure hes going to be cooperative with this right now. Hypernatremia -  Suspect dehydration due to poor PO intake most likely Per Dr. Joelyn Oms: doubts sodium at this level causing the AMS Will hold off on any IVF for the moment, and just let them correct with dialysis. ESRD - Spoke with Dr. 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 Joelyn Oms Admission status: Place in obs   , [place] Hospitalists  How to contact the [facility] Attending or Consulting provider [place] or covering provider during after hours [place], for this patient?  Check the care team in [facility] and look for a) attending/consulting [facility] provider listed and b) the [facility] team listed Log into [email or web address]  Amion Physician Scheduling and messaging for groups and whole hospitals  On call and physician scheduling software for group practices, residents, hospitalists and other medical providers for call, clinic, rotation and shift schedules. On[employer] is a hospital-wide system for scheduling doctors and paging doctors on call. EasyPlot is for scientific plotting and data analysis.  [email or web address]  and use [place]'s universal password to access. If you do not have the password, please contact the hospital operator.  Locate the [facility] provider you are looking for under [facility] and page to a number that you can be directly reached. If you still have difficulty reaching the provider, please  page the [facility] (Director on Call) for the Hospitalists listed on amion for assistance.  10/02/2021, 12:10 AM

## 2021-10-02 NOTE — ED Notes (Signed)
Pt pulls off another pulse ox. MD gardner contacted again

## 2021-10-02 NOTE — ED Notes (Signed)
Pt continues to attempt getting out of bed, removing pulse ox.  Pt repositioned and placed in less advantageous position.

## 2021-10-02 NOTE — Care Management Obs Status (Signed)
[place] NOTIFICATION   Patient Details  Name: DAVAN NAWABI MRN: [id number] Date of Birth: 12-31-1940   Medicare Observation Status Notification Given:       Laurena Slimmer, RN 10/02/2021, 6:59 PM

## 2021-10-03 ENCOUNTER — Other Ambulatory Visit: Payer: Self-pay

## 2021-10-03 DIAGNOSIS — G309 Alzheimer's disease, unspecified: Secondary | ICD-10-CM | POA: Diagnosis present

## 2021-10-03 DIAGNOSIS — E039 Hypothyroidism, unspecified: Secondary | ICD-10-CM | POA: Diagnosis present

## 2021-10-03 DIAGNOSIS — G9341 Metabolic encephalopathy: Secondary | ICD-10-CM | POA: Diagnosis present

## 2021-10-03 DIAGNOSIS — D181 Lymphangioma, any site: Secondary | ICD-10-CM | POA: Diagnosis present

## 2021-10-03 DIAGNOSIS — Z992 Dependence on renal dialysis: Secondary | ICD-10-CM | POA: Diagnosis not present

## 2021-10-03 DIAGNOSIS — F0280 Dementia in other diseases classified elsewhere without behavioral disturbance: Secondary | ICD-10-CM | POA: Diagnosis present

## 2021-10-03 DIAGNOSIS — R4182 Altered mental status, unspecified: Secondary | ICD-10-CM | POA: Diagnosis not present

## 2021-10-03 DIAGNOSIS — F05 Delirium due to known physiological condition: Secondary | ICD-10-CM | POA: Diagnosis not present

## 2021-10-03 DIAGNOSIS — N186 End stage renal disease: Secondary | ICD-10-CM | POA: Diagnosis present

## 2021-10-03 DIAGNOSIS — M898X9 Other specified disorders of bone, unspecified site: Secondary | ICD-10-CM | POA: Diagnosis present

## 2021-10-03 DIAGNOSIS — I2510 Atherosclerotic heart disease of native coronary artery without angina pectoris: Secondary | ICD-10-CM | POA: Diagnosis present

## 2021-10-03 DIAGNOSIS — E870 Hyperosmolality and hypernatremia: Secondary | ICD-10-CM | POA: Diagnosis present

## 2021-10-03 DIAGNOSIS — D696 Thrombocytopenia, unspecified: Secondary | ICD-10-CM | POA: Diagnosis present

## 2021-10-03 DIAGNOSIS — E785 Hyperlipidemia, unspecified: Secondary | ICD-10-CM | POA: Diagnosis present

## 2021-10-03 DIAGNOSIS — I120 Hypertensive chronic kidney disease with stage 5 chronic kidney disease or end stage renal disease: Secondary | ICD-10-CM | POA: Diagnosis present

## 2021-10-03 DIAGNOSIS — R410 Disorientation, unspecified: Secondary | ICD-10-CM | POA: Diagnosis not present

## 2021-10-03 DIAGNOSIS — R1084 Generalized abdominal pain: Secondary | ICD-10-CM | POA: Diagnosis present

## 2021-10-03 DIAGNOSIS — G9340 Encephalopathy, unspecified: Secondary | ICD-10-CM | POA: Diagnosis not present

## 2021-10-03 DIAGNOSIS — E1151 Type 2 diabetes mellitus with diabetic peripheral angiopathy without gangrene: Secondary | ICD-10-CM | POA: Diagnosis present

## 2021-10-03 DIAGNOSIS — E0781 Sick-euthyroid syndrome: Secondary | ICD-10-CM | POA: Diagnosis present

## 2021-10-03 DIAGNOSIS — G4730 Sleep apnea, unspecified: Secondary | ICD-10-CM | POA: Diagnosis present

## 2021-10-03 DIAGNOSIS — K219 Gastro-esophageal reflux disease without esophagitis: Secondary | ICD-10-CM | POA: Diagnosis present

## 2021-10-03 DIAGNOSIS — Z20822 Contact with and (suspected) exposure to covid-19: Secondary | ICD-10-CM | POA: Diagnosis present

## 2021-10-03 DIAGNOSIS — I700 Atherosclerosis of aorta: Secondary | ICD-10-CM | POA: Diagnosis not present

## 2021-10-03 DIAGNOSIS — E875 Hyperkalemia: Secondary | ICD-10-CM | POA: Diagnosis present

## 2021-10-03 DIAGNOSIS — N390 Urinary tract infection, site not specified: Secondary | ICD-10-CM | POA: Diagnosis present

## 2021-10-03 DIAGNOSIS — E1122 Type 2 diabetes mellitus with diabetic chronic kidney disease: Secondary | ICD-10-CM | POA: Diagnosis present

## 2021-10-03 DIAGNOSIS — D631 Anemia in chronic kidney disease: Secondary | ICD-10-CM | POA: Diagnosis present

## 2021-10-03 DIAGNOSIS — Z66 Do not resuscitate: Secondary | ICD-10-CM | POA: Diagnosis present

## 2021-10-03 DIAGNOSIS — N250 Renal osteodystrophy: Secondary | ICD-10-CM | POA: Diagnosis not present

## 2021-10-03 DIAGNOSIS — G9389 Other specified disorders of brain: Secondary | ICD-10-CM | POA: Diagnosis not present

## 2021-10-03 LAB — GLUCOSE, CAPILLARY
Glucose-Capillary: 111 mg/dL — ABNORMAL HIGH (ref 70–99)
Glucose-Capillary: 87 mg/dL (ref 70–99)
Glucose-Capillary: 115 mg/dL — ABNORMAL HIGH (ref 70–99)
Glucose-Capillary: 147 mg/dL — ABNORMAL HIGH (ref 70–99)
Glucose-Capillary: 130 mg/dL — ABNORMAL HIGH (ref 70–99)

## 2021-10-03 LAB — T3, FREE: T3, Free: 1.8 pg/mL — ABNORMAL LOW (ref 2.0–4.4)

## 2021-10-03 LAB — CBC
HCT: 37.8 % — ABNORMAL LOW (ref 39.0–52.0)
Hemoglobin: 12.6 g/dL — ABNORMAL LOW (ref 13.0–17.0)
MCH: 33.7 pg (ref 26.0–34.0)
MCHC: 33.3 g/dL (ref 30.0–36.0)
MCV: 101.1 fL — ABNORMAL HIGH (ref 80.0–100.0)
Platelets: 119 10*3/uL — ABNORMAL LOW (ref 150–400)
RBC: 3.74 MIL/uL — ABNORMAL LOW (ref 4.22–5.81)
RDW: 15.2 % (ref 11.5–15.5)
WBC: 8.7 10*3/uL (ref 4.0–10.5)
nRBC: 0 % (ref 0.0–0.2)

## 2021-10-03 LAB — BASIC METABOLIC PANEL
Anion gap: 15 (ref 5–15)
BUN: 30 mg/dL — ABNORMAL HIGH (ref 8–23)
CO2: 23 mmol/L (ref 22–32)
Calcium: 9.1 mg/dL (ref 8.9–10.3)
Chloride: 102 mmol/L (ref 98–111)
Creatinine, Ser: 5.97 mg/dL — ABNORMAL HIGH (ref 0.61–1.24)
GFR, Estimated: 9 mL/min — ABNORMAL LOW (ref >60)
Glucose, Bld: 103 mg/dL — ABNORMAL HIGH (ref 70–99)
Potassium: 4.9 mmol/L (ref 3.5–5.1)
Sodium: 140 mmol/L (ref 135–145)

## 2021-10-03 LAB — HEPATITIS B SURFACE ANTIBODY,QUALITATIVE: Hep B S Ab: REACTIVE — AB

## 2021-10-03 LAB — URINE CULTURE

## 2021-10-03 MED ORDER — SODIUM CHLORIDE 0.9 % IV SOLN
1.0000 g | INTRAVENOUS | Status: DC
  Administered 2021-10-03 – 2021-10-05 (×3): 1 g via INTRAVENOUS
  Filled 2021-10-03 (×3): qty 10

## 2021-10-03 NOTE — Progress Notes (Addendum)
[facility] Progress Note   Subjective: Oriented to self only. Doesn't remember coming to hospital.     Objective Vitals:   10/03/21 0238 10/03/21 0350 10/03/21 0745 10/03/21 1236  BP: (!) 129/41 (!) 138/47 (!) 149/52 (!) 96/43  Pulse: 88 85 72 70  Resp: 16 16 17 14   Temp: 97.7 F (36.5 C) 97.7 F (36.5 C) 97.6 F (36.4 C) 97.7 F (36.5 C)  TempSrc: Oral Oral Oral Oral  SpO2: 99% 98% 96% 94%  Weight: 64.1 kg     Height: 5\' 11"  (1.803 m)      Physical Exam General: Pleasant elderly male in NAD Neuro: Oriented X 1. Cooperative.  Heart: S1 S2 RRR No M/R/G Lungs: CTAB Abdomen: NABS, NT Extremities:No LE edema.  Dialysis Access: R AVF+ T/B RIJ TDC drsg intact   Additional Objective Labs: Basic Metabolic Panel: Recent Labs  Lab 10/01/21 1845 10/02/21 0002 10/02/21 0505 10/03/21 0809  NA 148* 147* 150* 140  K 4.5 5.1 5.6* 4.9  CL 117*  --  109 102  CO2 22  --  26 23  GLUCOSE 87  --  93 103*  BUN 51*  --  63* 30*  CREATININE 7.28*  --  9.30* 5.97*  CALCIUM 7.8*  --  9.8 9.1   Liver Function Tests: Recent Labs  Lab 10/01/21 1845  AST 17  ALT 10  ALKPHOS 199*  BILITOT 0.6  PROT 5.1*  ALBUMIN 2.7*   No results for input(s): LIPASE, AMYLASE in the last 168 hours. CBC: Recent Labs  Lab 10/01/21 1845 10/02/21 0002 10/02/21 0505 10/03/21 0809  WBC 7.2  --  9.6 8.7  HGB 10.4* 11.6* 12.2* 12.6*  HCT 34.5* 34.0* 38.8* 37.8*  MCV 108.2*  --  105.7* 101.1*  PLT 121*  --  140* 119*   Blood Culture    Component Value Date/Time   SDES WOUND RIGHT ARTERIAL FISTULA 09/01/2019 1305   SPECREQUEST NONE 09/01/2019 1305   CULT  09/01/2019 1305    FEW STAPHYLOCOCCUS AUREUS NO ANAEROBES ISOLATED Performed at [facility] [address] [place], [place] [address]    REPTSTATUS 09/06/2019 FINAL 09/01/2019 1305    Cardiac Enzymes: No results for input(s): CKTOTAL, CKMB, CKMBINDEX, TROPONINI in the last 168 hours. CBG: Recent Labs  Lab  10/02/21 1551 10/02/21 1635 10/03/21 0353 10/03/21 0749 10/03/21 1234  GLUCAP 69* 77 111* 87 115*   Iron Studies: No results for input(s): IRON, TIBC, TRANSFERRIN, FERRITIN in the last 72 hours. @lablastinr3 @ Studies/Results: CT ABDOMEN PELVIS WO CONTRAST  Result Date: 10/01/2021 CLINICAL DATA:  Left lower quadrant abdominal pain for the past week. Altered mental status. EXAM: CT ABDOMEN AND PELVIS WITHOUT CONTRAST TECHNIQUE: Multidetector CT imaging of the abdomen and pelvis was performed following the standard protocol without IV contrast. COMPARISON:  CT abdomen and pelvis dated July 03, 2021. FINDINGS: Lower chest: No acute abnormality. Left lower lobe atelectasis. Resolved pleural effusions. Hepatobiliary: No focal liver abnormality is seen. No gallstones, gallbladder wall thickening, or biliary dilatation. Pancreas: Unremarkable. No pancreatic ductal dilatation or surrounding inflammatory changes. Spleen: Normal in size without focal abnormality. Adrenals/Urinary Tract: The adrenal glands are unremarkable. Unchanged bilateral renal atrophy and cysts. No renal calculi or hydronephrosis. The bladder is decompressed. Stomach/Bowel: Stomach is within normal limits. Appendix appears normal. No evidence of bowel wall thickening, distention, or inflammatory changes. Vascular/Lymphatic: Prior aortobifemoral bypass graft. Aortic atherosclerosis. No enlarged abdominal or pelvic lymph nodes. Reproductive: Prostate is unremarkable. Other: Unchanged small fat  containing left inguinal hernia. No free fluid or pneumoperitoneum. Musculoskeletal: No acute or significant osseous findings. Unchanged chronic thoracolumbar compression deformities. Chronic partially united right intertrochanteric femur fracture status post ORIF. IMPRESSION: 1. No acute intra-abdominal process. Electronically Signed   By: Titus Dubin M.D.   On: 10/01/2021 21:42   CT Head Wo Contrast  Result Date: 10/01/2021 CLINICAL DATA:   Mental status change, unknown cause Worsening confusion. EXAM: CT HEAD WITHOUT CONTRAST TECHNIQUE: Contiguous axial images were obtained from the base of the skull through the vertex without intravenous contrast. COMPARISON:  Head CT 11/07/2020 FINDINGS: Brain: Chronic bifrontal hygromas, left greater than right, not significantly changed from prior exam. This measures up to 10 mm in the left in the frontal region, measured on series 4, image 10. Left hygroma is minimally increased in density compared to CSF, but stable from prior exam. No hyperdense component to suggest acute hemorrhage. Generalized atrophy and chronic small vessel ischemia which is stable. No evidence of acute infarct. No midline shift or mass effect. Vascular: Atherosclerosis of skullbase vasculature without hyperdense vessel or abnormal calcification. Skull: Bilateral frontal burr holes.  No fracture or focal lesion. Sinuses/Orbits: Paranasal sinuses and mastoid air cells are clear. The visualized orbits are unremarkable. Bilateral cataract resection. Other: None. IMPRESSION: 1. No acute intracranial abnormality. 2. Chronic bifrontal hygromas, left greater than right, not significantly changed from prior exam. No hyperdense component to suggest acute hemorrhage. 3. Stable atrophy and chronic small vessel ischemia. Electronically Signed   By: Keith Rake M.D.   On: 10/01/2021 20:17   DG Chest Portable 1 View  Result Date: 10/01/2021 CLINICAL DATA:  Altered mental status. EXAM: PORTABLE CHEST 1 VIEW COMPARISON:  Radiograph 07/03/2021 FINDINGS: Right-sided dialysis catheter in place. Chronic cardiomegaly with equivocal increase. Aortic atherosclerosis and tortuosity, stable. Slight increase in interstitial thickening from prior exam. Slight blunting of both costophrenic angle suggests small effusions. No confluent consolidation. No pneumothorax. No acute osseous abnormalities are seen. IMPRESSION: Chronic cardiomegaly with equivocal  increase. Slight progression in interstitial thickening from prior exam, pulmonary edema versus atypical infection. Possible small pleural effusions. Electronically Signed   By: Keith Rake M.D.   On: 10/01/2021 19:29   Medications:  cefTRIAXone (ROCEPHIN)  IV 1 g (10/03/21 0847)    (feeding supplement) PROSource Plus  30 mL Oral BID BM   Chlorhexidine Gluconate Cloth  6 each Topical Q0600   cinacalcet  30 mg Oral Q supper   [START ON 10/04/2021] doxercalciferol  3 mcg Intravenous Q M,W,F-HD   feeding supplement (NEPRO CARB STEADY)  237 mL Oral TID WC   multivitamin  1 tablet Oral QHS   pantoprazole (PROTONIX) IV  40 mg Intravenous Q12H     Dialysis Orders:[place] MWF  [place] -No heparin  -No ESA/Venofer -Hectorol 3 mcg IV TIW     Assessment/Plan:   Acute Encephalopathy in setting of pre-existing dementia. Recently started on Korsuva. Will DC drug at OP center. Difficult to distinguish uremia from dementia but no Asterixis. SCr 9.3 BUN 63 on admission. No missed treatments but has truncated a few. Does  appear more confused than usual today than normally seen at OP HD unit.   Mild hyperkalemia: Has been elevated at OP center on 3.0 K bath. Changed to 2.0 K bath.  K+ 4.9 today. Next HD 10/04/2021 Hypernatremia: Na 150 today. Initially thought to be due to poor PO intake but wife says he eats ice constantly. Should correct with HD.   ESRD -  MWF. Next  HD 10/04/2021. Needs TDC removed.   Hypertension/volume  - BP better controlled today. No excess volume by exam.  No BP meds on OP med list. Usually takes midodrine with HD. Hold midodrine for now. HD 01/09 now slightly below OP EDW. UF as tolerated.   Anemia  - HGB 12.6. No ESA as OP. Follow HGB.   Metabolic bone disease -  Last OP labs at goal 09/27/2021. Continue renvela binders, VDRA. Sensipar on OP med list, not on home med list. Will continue here.  Nutrition -Albumin low. Renal/Carb Mod diet. Start protein supps, renal vit. DMT2-per  primary   Jimmye Norman. Isai Gottlieb NP-C 10/03/2021, 1:17 PM  [employer] [phone]

## 2021-10-03 NOTE — TOC Initial Note (Signed)
Transition of Care [facility]) - Initial/Assessment Note    Patient Details  Name: [name] MRN: [id number] Date of Birth: June 02, 1941  Transition of Care [facility]) CM/SW Contact:    Carles Collet, RN Phone Number: 10/03/2021, 3:18 PM  Clinical Narrative:          81 year old with past medical history significant for diabetes type 2, hypertension, ESRD on hemodialysis M WF, dementia.  Admitted with acute metabolic encephalopathy.  Attempted to speak w patient at bedside, but is poor histoprian as documented in prior notes. Called wife, LVM. Patient is active w [facility] outpatient palliative, and was most recently active w [facility] in November. TOC will continue to follow.           Expected Discharge Plan: [place] Barriers to Discharge: Continued Medical Work up   Patient Goals and CMS Choice        Expected Discharge Plan and Services Expected Discharge Plan: [place]   Discharge Planning Services: CM Consult   Living arrangements for the past 2 months: Single Family Home                                      Prior Living Arrangements/Services Living arrangements for the past 2 months: Single Family Home Lives with:: Spouse                   Activities of Daily Living Home Assistive Devices/Equipment: Environmental consultant (specify type) ADL Screening (condition at time of admission) Patient's cognitive ability adequate to safely complete daily activities?: Yes Is the patient deaf or have difficulty hearing?: No Does the patient have difficulty seeing, even when wearing glasses/contacts?: No Does the patient have difficulty concentrating, remembering, or making decisions?: Yes Patient able to express need for assistance with ADLs?: Yes Does the patient have difficulty dressing or bathing?: Yes Independently performs ADLs?: No Communication: Independent Dressing (OT): Needs assistance Is this a change from baseline?: Pre-admission  baseline Grooming: Needs assistance Is this a change from baseline?: Pre-admission baseline Feeding: Independent Bathing: Needs assistance Is this a change from baseline?: Pre-admission baseline Toileting: Needs assistance Is this a change from baseline?: Pre-admission baseline In/Out Bed: Needs assistance Is this a change from baseline?: Change from baseline, expected to last >3 days Walks in Home: Needs assistance Is this a change from baseline?: Change from baseline, expected to last >3 days Does the patient have difficulty walking or climbing stairs?: Yes Weakness of Legs: Both Weakness of Arms/Hands: Both  Permission Sought/Granted                  Emotional Assessment              Admission diagnosis:  Generalized abdominal pain [R10.84] Encephalopathy [G93.40] Acute encephalopathy [G93.40] Patient Active Problem List   Diagnosis Date Noted   Acute encephalopathy 10/02/2021   Hypernatremia 10/02/2021   Elevated troponin level not due myocardial infarction 07/04/2021   Mixed diabetic hyperlipidemia associated with type 2 diabetes mellitus ([place]) 07/04/2021   Alzheimer's disease, unspecified (CODE) ([place]) 01/24/2021   Senile nuclear sclerosis 11/22/2020   Other specified erythematous condition 11/22/2020   Other specified disease of sebaceous glands 11/22/2020   Osteoporosis 11/22/2020   Obesity 11/22/2020   Macrocytosis 11/22/2020   Dystrophia unguium 11/22/2020   Tinea pedis 11/22/2020   Pre-operative clearance    Closed nondisplaced intertrochanteric fracture of right femur (  [place]) 11/07/2020   Hip fracture ([place]) 11/07/2020   Fall    Rectal bleeding 04/22/2020   AVM (arteriovenous malformation) of small bowel, acquired    Hiatal hernia    Schatzki's ring of distal esophagus    Other bacterial infections of unspecified site 08/31/2019   Hypocalcemia 04/06/2019   Melena 11/19/2018   Leukocytosis    Anemia    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    Diabetes mellitus type 2 in nonobese [facility])    Loose stools    Dementia without behavioral disturbance (HCC)    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   Multiple fractures of ribs, left side, initial encounter for closed fracture 07/21/2018   Subdural hematoma 07/21/2018   Gastritis, unspecified, with bleeding 06/04/2018   Angiodysplasia of stomach    Occult GI bleeding    GIB (gastrointestinal bleeding) 06/01/2018   Low back pain 01/17/2018   Anorexia    Diverticulitis of intestine, part unspecified, without perforation or abscess without bleeding 08/07/2017   Dyspnea    Macrocytic anemia 06/04/2016   Thrombocytopenia ([place]) 06/04/2016   Exertional dyspnea 06/04/2016   Arm paresthesia, left 06/04/2016   Dyspnea on exertion 06/04/2016   Tenderness of right calf 06/04/2016   Mild cognitive impairment with memory loss 01/12/2016   ESRD on dialysis ([place]) 05/17/2015   Hyperlipidemia, unspecified 02/25/2015   Pruritus, unspecified 02/25/2015   Pain, unspecified 02/25/2015   Other specified coagulation defects ([place]) 02/25/2015   Iron deficiency anemia, unspecified 02/25/2015   Headache, unspecified 02/25/2015   Gout, unspecified 02/25/2015   Fever, unspecified 02/25/2015   Diarrhea, unspecified 02/25/2015   Anemia in chronic kidney disease 02/25/2015   Acute embolism and thrombosis of left subclavian vein ([place]) 02/25/2015   Hypoglycemia    Weakness 02/18/2015   OSA (obstructive sleep apnea) 02/02/2015   Hyperkalemia 10/05/2014   End stage renal disease ([place]) 11/18/2013   BPH (benign prostatic hyperplasia) 11/22/2012   Peripheral vascular disease ([place]) 12/24/2011   Hyperparathyroidism ([place]) 12/24/2011   Hypothyroidism 12/24/2011   Allergic rhinitis 12/24/2011   Erectile dysfunction 12/24/2011   Insulin dependent diabetes mellitus 09/03/2008   PANCREATITIS, HX OF 08/30/2008   RENAL FAILURE, ACUTE, HX OF 08/30/2008   DIVERTICULOSIS, COLON 06/08/2003    PCP:  Elby Showers, MD Pharmacy:   [facility] [place], [place] - [place] [address] Phone: [phone] Fax: [place], [place] - [place] [address] [place] [address] Phone: [phone] Fax: [phone]  [place], [place] [address] [place] [address] Phone: [phone] Fax: [phone]     Social Determinants of Health (SDOH) Interventions    Readmission Risk Interventions No flowsheet data found.

## 2021-10-03 NOTE — Evaluation (Signed)
Physical Therapy Evaluation Patient Details Name: [name] MRN: [id number] DOB: 1941/09/13 Today's Date: 10/03/2021  History of Present Illness  81 y.o. male presenting to ED 1/18 with LLQ abdominal and suprapubic pain x1 week and AMS on top of basline dementia. Patient admitted with hypernatremia and acute encephalopathy secondary to dehydration vs medication induced. Further work-up (-) for acute findings. PMHx significant for dementia, fall w/ R hip fx s/p IM nailing 10/2020, Hx of UTIs, ESRD on HD MWF, PVD, HTN and DVT.  Clinical Impression  Pt pleasantly confused and willing to participate in therapy session. He reports back and BUE soreness. Pt requiring moderate assist for transfers from a low surface and ambulating 60 feet with a walker and min assist. Requires consistent assist for steering walker and for environmental negotiation. Recommend follow up HHPT to address strengthening, balance, and functional mobility.      Recommendations for follow up therapy are one component of a multi-disciplinary discharge planning process, led by the attending physician.  Recommendations may be updated based on patient status, additional functional criteria and insurance authorization.  Follow Up Recommendations Home health PT    Assistance Recommended at Discharge Frequent or constant Supervision/Assistance  Patient can return home with the following  A little help with walking and/or transfers;Direct supervision/assist for medications management;Direct supervision/assist for financial management;Assist for transportation;Help with stairs or ramp for entrance    Equipment Recommendations None recommended by PT  Recommendations for Other Services       Functional Status Assessment Patient has had a recent decline in their functional status and demonstrates the ability to make significant improvements in function in a reasonable and predictable amount of time.     Precautions / Restrictions  Precautions Precautions: Fall Restrictions Weight Bearing Restrictions: No      Mobility  Bed Mobility Overal bed mobility: Needs Assistance Bed Mobility: Sit to Supine       Sit to supine: Min assist   General bed mobility comments: assist for initiation    Transfers Overall transfer level: Needs assistance Equipment used: Rolling walker (2 wheels) Transfers: Sit to/from Stand Sit to Stand: Mod assist           General transfer comment: ModA to boost up from lower recliner surface    Ambulation/Gait Ambulation/Gait assistance: Min assist Gait Distance (Feet): 60 Feet Assistive device: Rolling walker (2 wheels) Gait Pattern/deviations: Step-through pattern;Decreased stride length;Shuffle Gait velocity: decreased Gait velocity interpretation: <1.8 ft/sec, indicate of risk for recurrent falls   General Gait Details: Cues for increased foot clearance, walker use, intermittent assist for steering walker and environmental negotiation.  Stairs            Wheelchair Mobility    Modified Rankin (Stroke Patients Only)       Balance Overall balance assessment: Needs assistance Sitting-balance support: No upper extremity supported;Feet supported Sitting balance-Leahy Scale: Fair     Standing balance support: Bilateral upper extremity supported;During functional activity;Reliant on assistive device for balance Standing balance-Leahy Scale: Poor Standing balance comment: Reliant on at least unilateral UE support to maintain static standing balance.                             Pertinent Vitals/Pain Pain Assessment: Faces Faces Pain Scale: Hurts little more Pain Location: back, BUE's Pain Descriptors / Indicators: Aching;Sore Pain Intervention(s): Monitored during session    Home Living Family/patient expects to be discharged to:: Private residence Living Arrangements: Spouse/significant other  Available Help at Discharge: Family;Personal care  attendant;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 hours/day (P[facility] 5 days/wk MWF 8:30-11am and T,TH 9-12am.) Type of Home: House Home Access: Other (comment);Stairs to enter (Stair lift in garage)       Home Layout: Two level;Able to live on main level with bedroom/bathroom Home Equipment: Rolling Walker (2 wheels);Wheelchair - manual;Transport chair;Grab bars - toilet;Hospital bed      Prior Function Prior Level of Function : Needs assist  Cognitive Assist : ADLs (cognitive)   ADLs (Cognitive): Step by step cues Physical Assist : Mobility (physical);ADLs (physical) Mobility (physical): Transfers;Gait ADLs (physical): Grooming;Bathing;Dressing;Toileting;IADLs Mobility Comments: Mobility with Supervision A and RW in home and wheelchair vs transport chair in community dwellings. ADLs Comments: Assist with bathing/dressing and toileting. Able to feed himself. Wife reports increased need for external assist with ADLs.     Hand Dominance   Dominant Hand: Right    Extremity/Trunk Assessment   Upper Extremity Assessment Upper Extremity Assessment: Defer to OT evaluation    Lower Extremity Assessment Lower Extremity Assessment: Generalized weakness    Cervical / Trunk Assessment Cervical / Trunk Assessment: Kyphotic  Communication   Communication: No difficulties  Cognition Arousal/Alertness: Awake/alert Behavior During Therapy: Flat affect Overall Cognitive Status: History of cognitive impairments - at baseline                                 General Comments: Baseline dementia; A&O to person only. [place] 1-step verbal commands with increased time. Pleasantly confused        General Comments      Exercises     Assessment/Plan    PT Assessment Patient needs continued PT services  PT Problem List Decreased strength;Decreased activity tolerance;Decreased balance;Decreased mobility;Decreased cognition;Decreased safety awareness       PT Treatment Interventions DME  instruction;Gait training;Functional mobility training;Therapeutic activities;Balance training;Therapeutic exercise;Patient/family education    PT Goals (Current goals can be found in the Care Plan section)  Acute Rehab PT Goals Patient Stated Goal: to go home PT Goal Formulation: With patient Time For Goal Achievement: 10/17/21 Potential to Achieve Goals: Good    Frequency Min 3X/week     Co-evaluation               AM-PAC PT "6 Clicks" Mobility  Outcome Measure Help needed turning from your back to your side while in a flat bed without using bedrails?: A Little Help needed moving from lying on your back to sitting on the side of a flat bed without using bedrails?: A Little Help needed moving to and from a bed to a chair (including a wheelchair)?: A Little Help needed standing up from a chair using your arms (e.g., wheelchair or bedside chair)?: A Little Help needed to walk in hospital room?: A Little Help needed climbing 3-5 steps with a railing? : A Lot 6 Click Score: 17    End of Session Equipment Utilized During Treatment: Gait belt Activity Tolerance: Patient tolerated treatment well Patient left: in bed;with call bell/phone within reach;with bed alarm set Nurse Communication: Mobility status PT Visit Diagnosis: Unsteadiness on feet (R26.81);Other abnormalities of gait and mobility (R26.89);Muscle weakness (generalized) (M62.81);Difficulty in walking, not elsewhere classified (R26.2)    Time: 1152-1209 PT Time Calculation (min) (ACUTE ONLY): 17 min   Charges:   PT Evaluation $PT Eval Moderate Complexity: [place], PT, DPT Acute Rehabilitation Services Pager [phone] Office  [place] 10/03/2021, 1:17 PM

## 2021-10-03 NOTE — Progress Notes (Signed)
Patient arrive on the unit on a stretcher, A&O x2, denied pain or discomfort at the time. Assessment done. Educated on safety plan and POC. We continue to monitor.

## 2021-10-03 NOTE — Evaluation (Signed)
Occupational Therapy Evaluation Patient Details Name: [name] MRN: [id number] DOB: 15-Aug-1941 Today's Date: 10/03/2021   History of Present Illness 81 y.o. male presenting to ED 1/18 with LLQ abdominal and suprapubic pain x1 week and AMS on top of basline dementia. Patient admitted with hypernatremia and acute encephalopathy secondary to dehydration vs medication induced. Further work-up (-) for acute findings. PMHx significant for dementia, fall w/ R hip fx s/p IM nailing 10/2020, Hx of UTIs, ESRD on HD MWF, PVD, HTN and DVT.   Clinical Impression   PTA patient was living with his spouse in a private residence and was requiring assistance with ADLs/IADLs with use of AD at baseline. Wife reports PCA assists with ADLs 5 days/wk for 2.5-3hrs. Patient currently requiring Min to Mod A grossly for ADLs and ADL transfers with cues for sequencing/safety. Patient also limited by deficits listed below including generalized weakness and decreased activity tolerance and would benefit from continued acute OT services in prep for safe d/c home with spouse and continued assist from PCA. OT will continue to follow acutely.        Recommendations for follow up therapy are one component of a multi-disciplinary discharge planning process, led by the attending physician.  Recommendations may be updated based on patient status, additional functional criteria and insurance authorization.   Follow Up Recommendations  Home health OT    Assistance Recommended at Discharge Frequent or constant Supervision/Assistance  Patient can return home with the following      Functional Status Assessment  Patient has had a recent decline in their functional status and demonstrates the ability to make significant improvements in function in a reasonable and predictable amount of time.  Equipment Recommendations  None recommended by OT    Recommendations for Other Services       Precautions / Restrictions  Precautions Precautions: Fall Precaution Comments: High fall risk; pleasantly confused Restrictions Weight Bearing Restrictions: No      Mobility Bed Mobility Overal bed mobility: Needs Assistance Bed Mobility: Supine to Sit     Supine to sit: Supervision;HOB elevated     General bed mobility comments: Supervision A for safety. Increased coaxing/encouragement initially.    Transfers Overall transfer level: Needs assistance Equipment used: 1 person hand held assist Transfers: Sit to/from Stand;Bed to chair/wheelchair/BSC Sit to Stand: Min assist     Step pivot transfers: Min assist     General transfer comment: Min A for sit to stand from EOB and to take steps to recliner with HHA. Cues for hand placement and sequencing.      Balance Overall balance assessment: Needs assistance Sitting-balance support: No upper extremity supported;Feet supported Sitting balance-Leahy Scale: Fair Sitting balance - Comments: Did not challenge dynamic sitting balance.   Standing balance support: Bilateral upper extremity supported;During functional activity;Reliant on assistive device for balance Standing balance-Leahy Scale: Poor Standing balance comment: Reliant on at least unilateral UE support to maintain static standing balance.                           ADL either performed or assessed with clinical judgement   ADL Overall ADL's : Needs assistance/impaired Eating/Feeding: Set up;Sitting   Grooming: Supervision/safety;Sitting;Cueing for sequencing   Upper Body Bathing: Minimal assistance;Sitting;Cueing for sequencing   Lower Body Bathing: Moderate assistance;Sit to/from stand;Cueing for sequencing   Upper Body Dressing : Minimal assistance;Sitting;Cueing for sequencing   Lower Body Dressing: Maximal assistance;Sit to/from stand;Cueing for sequencing   Toilet Transfer:  Minimal assistance Toilet Transfer Details (indicate cue type and reason): Simulated with  transfer to recliner with HHA. Cues for hand placement.                 Vision Baseline Vision/History: 0 No visual deficits Ability to See in Adequate Light: 1 Impaired Patient Visual Report: No change from baseline Additional Comments: Patient with difficulty identifying time on wall clock. Reports floaters in vision (likely baseline).     Perception     Praxis      Pertinent Vitals/Pain Pain Assessment: Faces Faces Pain Scale: Hurts little more Pain Location: Abdomen Pain Descriptors / Indicators: Aching;Sore Pain Intervention(s): Limited activity within patient's tolerance;Monitored during session;Repositioned     Hand Dominance Right   Extremity/Trunk Assessment Upper Extremity Assessment Upper Extremity Assessment: Generalized weakness   Lower Extremity Assessment Lower Extremity Assessment: Defer to PT evaluation   Cervical / Trunk Assessment Cervical / Trunk Assessment: Kyphotic   Communication Communication Communication: No difficulties   Cognition Arousal/Alertness: Awake/alert Behavior During Therapy: Flat affect Overall Cognitive Status: History of cognitive impairments - at baseline                                 General Comments: Baseline dementia; A&O to person only. Folllows 1-step verbal commands with increased time. Participatory with OT evaluation.     General Comments       Exercises     Shoulder Instructions      Home Living Family/patient expects to be discharged to:: Private residence Living Arrangements: Spouse/significant other Available Help at Discharge: Family;Personal care attendant;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 hours/day (PCA 5 days/wk MWF 8:30-11am and T,TH 9-12am.) Type of Home: House Home Access: Other (comment);Stairs to enter (Stair lift in garage)     Home Layout: Two level;Able to live on main level with bedroom/bathroom     Bathroom Shower/Tub: Sponge bathes at baseline   Bathroom Toilet: Handicapped  height Bathroom Accessibility: Yes How Accessible: Accessible via walker Home Equipment: [place] (2 wheels);Wheelchair - manual;Transport chair;Grab bars - toilet;Hospital bed          Prior Functioning/Environment Prior Level of Function : Needs assist  Cognitive Assist : ADLs (cognitive)   ADLs (Cognitive): Step by step cues Physical Assist : Mobility (physical);ADLs (physical) Mobility (physical): Transfers;Gait ADLs (physical): Grooming;Bathing;Dressing;Toileting;IADLs Mobility Comments: Mobility with Supervision A and RW in home and wheelchair vs transport chair in community dwellings. ADLs Comments: Assist with bathing/dressing and toileting. Able to feed himself. Wife reports increased need for external assist with ADLs.        OT Problem List: Decreased strength;Decreased activity tolerance;Impaired balance (sitting and/or standing);Impaired vision/perception;Decreased cognition;Decreased coordination;Decreased safety awareness      OT Treatment/Interventions: Self-care/ADL training;Therapeutic exercise;Energy conservation;DME and/or AE instruction;Therapeutic activities;Cognitive remediation/compensation;Patient/family education;Balance training    OT Goals(Current goals can be found in the care plan section) Acute Rehab OT Goals Patient Stated Goal: For patient to return home (per wife via phone call). OT Goal Formulation: With family Time For Goal Achievement: 10/17/21 Potential to Achieve Goals: Good ADL Goals Pt Will Perform Grooming: with supervision;standing Pt Will Perform Upper Body Dressing: with supervision;sitting Pt Will Perform Lower Body Dressing: with min assist;sit to/from stand Pt Will Transfer to Toilet: with supervision;ambulating Pt Will Perform Toileting - Clothing Manipulation and hygiene: sit to/from stand;with min assist  OT Frequency: Min 2X/week    Co-evaluation  AM-PAC OT "6 Clicks" Daily Activity     Outcome  Measure Help from another person eating meals?: A Little Help from another person taking care of personal grooming?: A Little Help from another person toileting, which includes using toliet, bedpan, or urinal?: A Lot Help from another person bathing (including washing, rinsing, drying)?: A Lot Help from another person to put on and taking off regular upper body clothing?: A Little Help from another person to put on and taking off regular lower body clothing?: A Lot 6 Click Score: 15   End of Session Equipment Utilized During Treatment: Gait belt Nurse Communication: Mobility status  Activity Tolerance: Patient tolerated treatment well Patient left: in chair;with call bell/phone within reach;Other (comment) (Belt alarm activated)  OT Visit Diagnosis: Unsteadiness on feet (R26.81);Muscle weakness (generalized) (M62.81);History of falling (Z91.81);Other symptoms and signs involving cognitive function;Pain Pain - part of body:  (abdomen)                Time: 2952-8413 OT Time Calculation (min): 24 min Charges:  OT General Charges $OT Visit: 1 Visit OT Evaluation $OT Eval Moderate Complexity: 1 Mod OT Treatments $Therapeutic Activity: 8-22 mins  Tyris Eliot H. OTR/L Supplemental OT, Department of rehab services [phone]  Dalayla Aldredge R H. 10/03/2021, 9:36 AM

## 2021-10-03 NOTE — Progress Notes (Signed)
Inpatient Diabetes Program Recommendations  AACE/ADA: New Consensus Statement on Inpatient Glycemic Control (2015)  Target Ranges:  Prepandial:   less than 140 mg/dL      Peak postprandial:   less than 180 mg/dL (1-2 hours)      Critically ill patients:  140 - 180 mg/dL   Lab Results  Component Value Date   GLUCAP 115 (H) 10/03/2021   HGBA1C 5.8 (H) 10/02/2021    Review of Glycemic Control  Latest Reference Range & Units 10/02/21 15:51 10/02/21 16:35 10/03/21 03:53 10/03/21 07:49 10/03/21 12:34  Glucose-Capillary 70 - 99 mg/dL 69 (L) 77 111 (H) 87 115 (H)   Diabetes history: DM 2 Outpatient Diabetes medications: Tradjenta 5 mg daily Current orders for Inpatient glycemic control:  Novolog sensitive q 4 hours  Inpatient Diabetes Program Recommendations:    Consider holding Novolog for now and just monitor blood sugars tid prior to meals and HS.   Thanks,  Adah Perl, RN, BC-ADM Inpatient Diabetes Coordinator Pager [phone]  (8a-5p)

## 2021-10-03 NOTE — Progress Notes (Signed)
Pt receives out-pt HD at [facility] on MWF. Pt arrives at 11:25 for 11:45 chair time. Will assist as needed.  Melven Sartorius Renal Navigator [phone]

## 2021-10-03 NOTE — Care Management (Signed)
°  Transition of Care [facility]) Screening Note   Patient Details  Name: [name] Date of Birth: [birth year]   [facility] CM/SW Contact:    Carles Collet, RN Phone Number: 10/03/2021, 8:17 AM    Transition of Care Department [facility]) has reviewed patient and we will continue to monitor patient advancement through interdisciplinary progression rounds.  Per chart review patient is active w [facility] outpatient palliative, and recently active with [facility] (will need to verify if current). From home w wife.

## 2021-10-03 NOTE — Progress Notes (Signed)
PROGRESS NOTE    [name]  DQQ:[id number] DOB: December 25, 1940 DOA: 10/01/2021 PCP: Elby Showers, MD   Brief Narrative: 81 year old with past medical history significant for diabetes type 2, hypertension, ESRD on hemodialysis M WF, dementia.  Patient has had poor oral intake over the past couple of months due to dementia as described in palliative care notes.  Presented to the ED with worsening confusion that baseline for about 1 week.  Patient complaining of abdominal pain and suprapubic pain.  CT abdomen and pelvis was negative for acute finding.   Assessment & Plan:   Principal Problem:   Acute encephalopathy Active Problems:   End stage renal disease (HCC)   Alzheimer's disease, unspecified (CODE) (HCC)   Hypernatremia  1-Acute Metabolic Encephalopathy: Recently started on Korsuva as out patient,  1 week ago, with presume opioid properties. Probably explain patient presentation. Continue to hold that medication/ -If no improvement with subsequent hemodialysis might need MRI -CT head: Acute intracranial abnormality.  Chronic bifrontal hygroma stable -Ammonia: 37, not significantly elevated -ABG very mild hypoxemia.  Patient was placed on oxygen supplementation As needed Haldol TSH 0.2;  free T 3: 1.8  and T 4. 0.8. he will need repeat thyroid function test.  -B 12. 463. -He appears less confuse, more calm today.  2-Hypernatremia: Suspect related to poor oral intake Per Nephrologist, hold on IV fluids at the moment Corrected with HD.   3-ESRD: On hemodialysis.  Nephrology has been consulted  4-Alzheimer's disease 5-Abdominal pain: Started IV PPI twice daily. CT Abdomen and pelvis negative for acute abnormalities Discussed with wife, he has been complaing of supra-pubic pain. UA with more than 50 WBC. Follow urine culture. Will cover with IV ceftriaxone.   Estimated body mass index is 19.72 kg/m as calculated from the following:   Height as of this encounter: 5\' 11"   (1.803 m).   Weight as of this encounter: 64.1 kg.   DVT prophylaxis: SCD Code Status: DNR Family Communication: Wife at bedside 1/09 Disposition Plan:  Status is: Observation  The patient remains OBS appropriate and will d/c before 2 midnights.       Consultants:  Nephrology   Procedures:  HD  Antimicrobials:    Subjective: He is alert, follows command. He is asking for his wife.  He denies supra-pubic pain.   Objective: Vitals:   10/03/21 0238 10/03/21 0350 10/03/21 0745 10/03/21 1236  BP: (!) 129/41 (!) 138/47 (!) 149/52 (!) 96/43  Pulse: 88 85 72 70  Resp: 16 16 17 14   Temp: 97.7 F (36.5 C) 97.7 F (36.5 C) 97.6 F (36.4 C) 97.7 F (36.5 C)  TempSrc: Oral Oral Oral Oral  SpO2: 99% 98% 96% 94%  Weight: 64.1 kg     Height: 5\' 11"  (1.803 m)       Intake/Output Summary (Last 24 hours) at 10/03/2021 1409 Last data filed at 10/03/2021 1049 Gross per 24 hour  Intake 240 ml  Output --  Net 240 ml   Filed Weights   10/01/21 1822 10/03/21 0238  Weight: 68.5 kg 64.1 kg    Examination:  General exam: NAD Respiratory system: CTA Cardiovascular system: S,1  S 2 RRR Gastrointestinal system: BS present, soft, nt Central nervous system: Alert, moves all 4 extremities. More cooperative and calm.  Extremities: No edema  Data Reviewed: I have personally reviewed following labs and imaging studies  CBC: Recent Labs  Lab 10/01/21 1845 10/02/21 0002 10/02/21 0505 10/03/21 0809  WBC 7.2  --  9.6 8.7  HGB 10.4* 11.6* 12.2* 12.6*  HCT 34.5* 34.0* 38.8* 37.8*  MCV 108.2*  --  105.7* 101.1*  PLT 121*  --  140* 119*    Basic Metabolic Panel: Recent Labs  Lab 10/01/21 1845 10/02/21 0002 10/02/21 0505 10/03/21 0809  NA 148* 147* 150* 140  K 4.5 5.1 5.6* 4.9  CL 117*  --  109 102  CO2 22  --  26 23  GLUCOSE 87  --  93 103*  BUN 51*  --  63* 30*  CREATININE 7.28*  --  9.30* 5.97*  CALCIUM 7.8*  --  9.8 9.1    GFR: Estimated Creatinine Clearance:  8.9 mL/min (A) (by C-G formula based on SCr of 5.97 mg/dL (H)). Liver Function Tests: Recent Labs  Lab 10/01/21 1845  AST 17  ALT 10  ALKPHOS 199*  BILITOT 0.6  PROT 5.1*  ALBUMIN 2.7*    No results for input(s): LIPASE, AMYLASE in the last 168 hours. Recent Labs  Lab 10/01/21 2107  AMMONIA 37*    Coagulation Profile: No results for input(s): INR, PROTIME in the last 168 hours. Cardiac Enzymes: No results for input(s): CKTOTAL, CKMB, CKMBINDEX, TROPONINI in the last 168 hours. BNP (last 3 results) No results for input(s): PROBNP in the last 8760 hours. HbA1C: Recent Labs    10/02/21 0505  HGBA1C 5.8*   CBG: Recent Labs  Lab 10/02/21 1551 10/02/21 1635 10/03/21 0353 10/03/21 0749 10/03/21 1234  GLUCAP 69* 77 111* 87 115*    Lipid Profile: No results for input(s): CHOL, HDL, LDLCALC, TRIG, CHOLHDL, LDLDIRECT in the last 72 hours. Thyroid Function Tests: Recent Labs    10/02/21 0505 10/02/21 0506  TSH 0.241*  --   FREET4  --  0.83  T3FREE  --  1.8*    Anemia Panel: Recent Labs    10/02/21 0506  VITAMINB12 463   Sepsis Labs: No results for input(s): PROCALCITON, LATICACIDVEN in the last 168 hours.  Recent Results (from the past 240 hour(s))  Resp Panel by RT-PCR (Flu A&B, Covid) Nasopharyngeal Swab     Status: None   Collection Time: 10/01/21  6:45 PM   Specimen: Nasopharyngeal Swab; Nasopharyngeal(NP) swabs in vial transport medium  Result Value Ref Range Status   SARS Coronavirus 2 by RT PCR NEGATIVE NEGATIVE Final    Comment: (NOTE) SARS-CoV-2 target nucleic acids are NOT DETECTED.  The SARS-CoV-2 RNA is generally detectable in upper respiratory specimens during the acute phase of infection. The lowest concentration of SARS-CoV-2 viral copies this assay can detect is 138 copies/mL. A negative result does not preclude SARS-Cov-2 infection and should not be used as the sole basis for treatment or other patient management decisions. A negative  result may occur with  improper specimen collection/handling, submission of specimen other than nasopharyngeal swab, presence of viral mutation(s) within the areas targeted by this assay, and inadequate number of viral copies(<138 copies/mL). A negative result must be combined with clinical observations, patient history, and epidemiological information. The expected result is Negative.  Fact Sheet for Patients:  EntrepreneurPulse.com.au  Fact Sheet for Healthcare Providers:  IncredibleEmployment.be  This test is no t yet approved or cleared by the [place] FDA and  has been authorized for detection and/or diagnosis of SARS-CoV-2 by FDA under an Emergency Use Authorization (EUA). This EUA will remain  in effect (meaning this test can be used) for the duration of the COVID-19 declaration under Section 564(b)(1) of the Act, 21 U.S.C.section 360bbb-3(b)(1), unless  the authorization is terminated  or revoked sooner.       Influenza A by PCR NEGATIVE NEGATIVE Final   Influenza B by PCR NEGATIVE NEGATIVE Final    Comment: (NOTE) The Xpert Xpress SARS-CoV-2/FLU/RSV plus assay is intended as an aid in the diagnosis of influenza from Nasopharyngeal swab specimens and should not be used as a sole basis for treatment. Nasal washings and aspirates are unacceptable for Xpert Xpress SARS-CoV-2/FLU/RSV testing.  Fact Sheet for Patients: EntrepreneurPulse.com.au  Fact Sheet for Healthcare Providers: IncredibleEmployment.be  This test is not yet approved or cleared by the [place] FDA and has been authorized for detection and/or diagnosis of SARS-CoV-2 by FDA under an Emergency Use Authorization (EUA). This EUA will remain in effect (meaning this test can be used) for the duration of the COVID-19 declaration under Section 564(b)(1) of the Act, 21 U.S.C. section 360bbb-3(b)(1), unless the authorization is  terminated or revoked.  Performed at [facility], [place] [address] [place], [place] [address]           Radiology Studies: CT ABDOMEN PELVIS WO CONTRAST  Result Date: 10/01/2021 CLINICAL DATA:  Left lower quadrant abdominal pain for the past week. Altered mental status. EXAM: CT ABDOMEN AND PELVIS WITHOUT CONTRAST TECHNIQUE: Multidetector CT imaging of the abdomen and pelvis was performed following the standard protocol without IV contrast. COMPARISON:  CT abdomen and pelvis dated July 03, 2021. FINDINGS: Lower chest: No acute abnormality. Left lower lobe atelectasis. Resolved pleural effusions. Hepatobiliary: No focal liver abnormality is seen. No gallstones, gallbladder wall thickening, or biliary dilatation. Pancreas: Unremarkable. No pancreatic ductal dilatation or surrounding inflammatory changes. Spleen: Normal in size without focal abnormality. Adrenals/Urinary Tract: The adrenal glands are unremarkable. Unchanged bilateral renal atrophy and cysts. No renal calculi or hydronephrosis. The bladder is decompressed. Stomach/Bowel: Stomach is within normal limits. Appendix appears normal. No evidence of bowel wall thickening, distention, or inflammatory changes. Vascular/Lymphatic: Prior aortobifemoral bypass graft. Aortic atherosclerosis. No enlarged abdominal or pelvic lymph nodes. Reproductive: Prostate is unremarkable. Other: Unchanged small fat containing left inguinal hernia. No free fluid or pneumoperitoneum. Musculoskeletal: No acute or significant osseous findings. Unchanged chronic thoracolumbar compression deformities. Chronic partially united right intertrochanteric femur fracture status post ORIF. IMPRESSION: 1. No acute intra-abdominal process. Electronically Signed   By: Titus Dubin M.D.   On: 10/01/2021 21:42   CT Head Wo Contrast  Result Date: 10/01/2021 CLINICAL DATA:  Mental status change, unknown cause Worsening confusion. EXAM: CT HEAD WITHOUT CONTRAST TECHNIQUE:  Contiguous axial images were obtained from the base of the skull through the vertex without intravenous contrast. COMPARISON:  Head CT 11/07/2020 FINDINGS: Brain: Chronic bifrontal hygromas, left greater than right, not significantly changed from prior exam. This measures up to 10 mm in the left in the frontal region, measured on series 4, image 10. Left hygroma is minimally increased in density compared to CSF, but stable from prior exam. No hyperdense component to suggest acute hemorrhage. Generalized atrophy and chronic small vessel ischemia which is stable. No evidence of acute infarct. No midline shift or mass effect. Vascular: Atherosclerosis of skullbase vasculature without hyperdense vessel or abnormal calcification. Skull: Bilateral frontal burr holes.  No fracture or focal lesion. Sinuses/Orbits: Paranasal sinuses and mastoid air cells are clear. The visualized orbits are unremarkable. Bilateral cataract resection. Other: None. IMPRESSION: 1. No acute intracranial abnormality. 2. Chronic bifrontal hygromas, left greater than right, not significantly changed from prior exam. No hyperdense component to suggest acute hemorrhage. 3. Stable atrophy  and chronic small vessel ischemia. Electronically Signed   By: Keith Rake M.D.   On: 10/01/2021 20:17   DG Chest Portable 1 View  Result Date: 10/01/2021 CLINICAL DATA:  Altered mental status. EXAM: PORTABLE CHEST 1 VIEW COMPARISON:  Radiograph 07/03/2021 FINDINGS: Right-sided dialysis catheter in place. Chronic cardiomegaly with equivocal increase. Aortic atherosclerosis and tortuosity, stable. Slight increase in interstitial thickening from prior exam. Slight blunting of both costophrenic angle suggests small effusions. No confluent consolidation. No pneumothorax. No acute osseous abnormalities are seen. IMPRESSION: Chronic cardiomegaly with equivocal increase. Slight progression in interstitial thickening from prior exam, pulmonary edema versus atypical  infection. Possible small pleural effusions. Electronically Signed   By: Keith Rake M.D.   On: 10/01/2021 19:29        Scheduled Meds:  (feeding supplement) PROSource Plus  30 mL Oral BID BM   Chlorhexidine Gluconate Cloth  6 each Topical Q0600   cinacalcet  30 mg Oral Q supper   [START ON 10/04/2021] doxercalciferol  3 mcg Intravenous Q M,W,F-HD   feeding supplement (NEPRO CARB STEADY)  237 mL Oral TID WC   multivitamin  1 tablet Oral QHS   pantoprazole (PROTONIX) IV  40 mg Intravenous Q12H   Continuous Infusions:  cefTRIAXone (ROCEPHIN)  IV 1 g (10/03/21 0847)     LOS: 0 days    Time spent: 35 minutes    Marq Rebello A Amias Hutchinson, MD [facility]   If 7PM-7AM, please contact night-coverage [email or web address]  10/03/2021, 2:09 PM

## 2021-10-04 DIAGNOSIS — N186 End stage renal disease: Secondary | ICD-10-CM | POA: Diagnosis not present

## 2021-10-04 DIAGNOSIS — G309 Alzheimer's disease, unspecified: Secondary | ICD-10-CM | POA: Diagnosis not present

## 2021-10-04 DIAGNOSIS — G9340 Encephalopathy, unspecified: Secondary | ICD-10-CM | POA: Diagnosis not present

## 2021-10-04 LAB — GLUCOSE, CAPILLARY
Glucose-Capillary: 93 mg/dL (ref 70–99)
Glucose-Capillary: 91 mg/dL (ref 70–99)
Glucose-Capillary: 78 mg/dL (ref 70–99)

## 2021-10-04 LAB — HEMOGLOBIN A1C
Hgb A1c MFr Bld: 6.1 % — ABNORMAL HIGH (ref 4.8–5.6)
Mean Plasma Glucose: 128.37 mg/dL

## 2021-10-04 LAB — HEPATITIS B SURFACE ANTIBODY, QUANTITATIVE: Hep B S AB Quant (Post): 203.9 m[IU]/mL (ref >9.9)

## 2021-10-04 MED ORDER — LIDOCAINE-PRILOCAINE 2.5-2.5 % EX CREA: 1.0000 "application " | TOPICAL_CREAM | CUTANEOUS | Status: DC | PRN

## 2021-10-04 MED ORDER — SEVELAMER CARBONATE 800 MG PO TABS: 1600.0000 mg | ORAL_TABLET | ORAL | Status: DC | PRN

## 2021-10-04 MED ORDER — PENTAFLUOROPROP-TETRAFLUOROETH EX AERO: 1.0000 "application " | INHALATION_SPRAY | CUTANEOUS | Status: DC | PRN

## 2021-10-04 MED ORDER — SEVELAMER CARBONATE 800 MG PO TABS
2400.0000 mg | ORAL_TABLET | ORAL | Status: DC
  Administered 2021-10-05 (×2): 2400 mg via ORAL
  Filled 2021-10-04 (×2): qty 3

## 2021-10-04 MED ORDER — CARBOXYMETHYLCELLULOSE SODIUM 0.25 % OP SOLN: 1.0000 [drp] | OPHTHALMIC | Status: DC | PRN

## 2021-10-04 MED ORDER — HEPARIN SODIUM (PORCINE) 1000 UNIT/ML DIALYSIS
1000.0000 [IU] | INTRAMUSCULAR | Status: DC | PRN
  Administered 2021-10-04: 1000 [IU] via INTRAVENOUS_CENTRAL
  Filled 2021-10-04: qty 1

## 2021-10-04 MED ORDER — PANTOPRAZOLE SODIUM 40 MG PO TBEC
40.0000 mg | DELAYED_RELEASE_TABLET | Freq: Every day | ORAL | Status: DC
  Administered 2021-10-05: 40 mg via ORAL
  Filled 2021-10-04: qty 1

## 2021-10-04 MED ORDER — QUETIAPINE FUMARATE 50 MG PO TABS
50.0000 mg | ORAL_TABLET | Freq: Once | ORAL | Status: AC
  Administered 2021-10-04: 50 mg via ORAL
  Filled 2021-10-04: qty 1

## 2021-10-04 MED ORDER — SODIUM CHLORIDE 0.9 % IV SOLN: 100.0000 mL | INTRAVENOUS | Status: DC | PRN

## 2021-10-04 MED ORDER — INSULIN ASPART 100 UNIT/ML IJ SOLN: 0.0000 [IU] | Freq: Three times a day (TID) | INTRAMUSCULAR | Status: DC

## 2021-10-04 MED ORDER — DONEPEZIL HCL 10 MG PO TABS
10.0000 mg | ORAL_TABLET | Freq: Every day | ORAL | Status: DC
  Administered 2021-10-04: 10 mg via ORAL
  Filled 2021-10-04: qty 1

## 2021-10-04 MED ORDER — NEPRO/CARBSTEADY PO LIQD
237.0000 mL | Freq: Every day | ORAL | Status: DC
  Administered 2021-10-05: 237 mL via ORAL

## 2021-10-04 MED ORDER — MIDODRINE HCL 5 MG PO TABS: 5.0000 mg | ORAL_TABLET | ORAL | Status: DC

## 2021-10-04 MED ORDER — POLYVINYL ALCOHOL 1.4 % OP SOLN
1.0000 [drp] | OPHTHALMIC | Status: DC | PRN
  Filled 2021-10-04: qty 15

## 2021-10-04 MED ORDER — MEMANTINE HCL 10 MG PO TABS
10.0000 mg | ORAL_TABLET | Freq: Two times a day (BID) | ORAL | Status: DC
  Administered 2021-10-04 – 2021-10-05 (×2): 10 mg via ORAL
  Filled 2021-10-04 (×2): qty 1

## 2021-10-04 MED ORDER — ALLOPURINOL 100 MG PO TABS
100.0000 mg | ORAL_TABLET | Freq: Every day | ORAL | Status: DC
  Administered 2021-10-05: 100 mg via ORAL
  Filled 2021-10-04: qty 1

## 2021-10-04 MED ORDER — SEVELAMER CARBONATE 800 MG PO TABS: 2400.0000 mg | ORAL_TABLET | ORAL | Status: DC

## 2021-10-04 MED ORDER — FENTANYL CITRATE PF 50 MCG/ML IJ SOSY
12.5000 ug | PREFILLED_SYRINGE | Freq: Once | INTRAMUSCULAR | Status: AC
  Administered 2021-10-04: 12.5 ug via INTRAVENOUS

## 2021-10-04 MED ORDER — ALTEPLASE 2 MG IJ SOLR: 2.0000 mg | Freq: Once | INTRAMUSCULAR | Status: DC | PRN

## 2021-10-04 MED ORDER — SEVELAMER CARBONATE 800 MG PO TABS: 1600.0000 mg | ORAL_TABLET | ORAL | Status: DC

## 2021-10-04 MED ORDER — LEVOTHYROXINE SODIUM 50 MCG PO TABS
50.0000 ug | ORAL_TABLET | Freq: Every day | ORAL | Status: DC
  Administered 2021-10-05: 50 ug via ORAL
  Filled 2021-10-04: qty 1

## 2021-10-04 MED ORDER — FENTANYL CITRATE PF 50 MCG/ML IJ SOSY: 12.5000 ug | PREFILLED_SYRINGE | INTRAMUSCULAR | Status: DC | PRN

## 2021-10-04 MED ORDER — LIDOCAINE HCL (PF) 1 % IJ SOLN: 5.0000 mL | INTRAMUSCULAR | Status: DC | PRN

## 2021-10-04 MED ORDER — FENTANYL CITRATE PF 50 MCG/ML IJ SOSY
PREFILLED_SYRINGE | INTRAMUSCULAR | Status: AC
  Filled 2021-10-04: qty 1

## 2021-10-04 MED ORDER — HEPARIN SODIUM (PORCINE) 5000 UNIT/ML IJ SOLN
5000.0000 [IU] | Freq: Three times a day (TID) | INTRAMUSCULAR | Status: DC
  Administered 2021-10-04 – 2021-10-05 (×3): 5000 [IU] via SUBCUTANEOUS
  Filled 2021-10-04 (×3): qty 1

## 2021-10-04 MED ORDER — SIMVASTATIN 20 MG PO TABS
20.0000 mg | ORAL_TABLET | Freq: Every day | ORAL | Status: DC
  Administered 2021-10-04: 20 mg via ORAL
  Filled 2021-10-04: qty 1

## 2021-10-04 NOTE — Progress Notes (Signed)
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  Patient has had poor oral intake over the past couple of months due to dementia as described in palliative care notes.  Presented to the ED with worsening confusion that baseline for about 1 week.  Patient complaining of abdominal pain and suprapubic pain.  CT abdomen and pelvis was negative for acute finding.   Assessment & Plan:   Principal Problem:   Acute encephalopathy Active Problems:   End stage renal disease (HCC)   Alzheimer's disease, unspecified (CODE) (HCC)   Hypernatremia  Acute Metabolic Encephalopathy: -Possibly medication related as he is recently started on course Uva which may contribute to altered mental status, as well with underlying dementia, creatinine 9.3 on admission, BUN is 63, so uremia may be a factor as well.  . -If no improvement with subsequent hemodialysis might need MRI -CT head: Acute intracranial abnormality.  Chronic bifrontal hygroma stable -Ammonia: 37, not significantly elevated -ABG very mild hypoxemia.  Patient was placed on oxygen supplementation As needed Haldol -Sh is low at 0.2, but his free T4 is within normal limit at 0.8, so likely sick euthyroid syndrome, need to be repeated as an outpatient. -B 12. 463. -Patient is more sleepy today, as he did receive 1 dose of Seroquel yesterday due to restlessness and agitation.  Hypernatremia: Suspect related to poor oral intake significantly elevated at 150 at one-point this has improved with dialysis management, most recent is 140, Management with HD.  ESRD: On hemodialysis.  Nephrology has been consulted  Alzheimer's disease -Patient with known baseline dementia, continue with supportive care.  Abdominal pain: Started IV PPI twice daily. CT  Abdomen and pelvis negative for acute abnormalities Discussed with wife, he has been complaing of supra-pubic pain. UA with more than 50 WBC. Follow urine culture. Will cover with IV ceftriaxone.   Estimated body mass index is 19.72 kg/m as calculated from the following:   Height as of this encounter: 5\' 11"  (1.803 m).   Weight as of this encounter: 64.1 kg.   DVT prophylaxis: SCD Code Status: DNR Family Communication: Wife at bedside 1/09, none at bedside today Disposition Plan:  Status is: Inpatient         Consultants:  Nephrology   Procedures:  HD  Antimicrobials:    Subjective:  Patient was restless and agitated overnight where he was given one-time Seroquel, he is more sleepy and drowsy during the day today.  Objective: Vitals:   10/03/21 2331 10/04/21 0000 10/04/21 0805 10/04/21 1100  BP: (!) 131/50 (!) 130/51 (!) 106/51 (!) 134/58  Pulse: 70 69 65 64  Resp: 18 17 20 20   Temp: 98.3 F (36.8 C) 98.4 F (36.9 C) 97.6 F (36.4 C) (!) 97.4 F (36.3 C)  TempSrc: Oral Oral Oral Axillary  SpO2: 95% 98% 100% 91%  Weight:      Height:        Intake/Output Summary (Last 24 hours) at 10/04/2021 1302 Last data filed at 10/04/2021 0300 Gross per 24 hour  Intake 100 ml  Output --  Net 100 ml   Filed Weights   10/01/21 1822 10/03/21 0238  Weight: 68.5 kg 64.1 kg    Examination:  More sleepy today, arousable to verbal stimuli, but go back to sleep, demented, unable to answer any questions  appropriately Symmetrical Chest wall movement, Good air movement bilaterally, CTAB RRR,No Gallops,Rubs or new Murmurs, No Parasternal Heave +ve B.Sounds, Abd Soft, No tenderness, No rebound - guarding or rigidity. No Cyanosis, Clubbing or edema, No new Rash or bruise     Data Reviewed: I have personally reviewed following labs and imaging studies  CBC: Recent Labs  Lab 10/01/21 1845 10/02/21 0002 10/02/21 0505 10/03/21 0809  WBC 7.2  --  9.6 8.7  HGB 10.4*  11.6* 12.2* 12.6*  HCT 34.5* 34.0* 38.8* 37.8*  MCV 108.2*  --  105.7* 101.1*  PLT 121*  --  140* 132*   Basic Metabolic Panel: Recent Labs  Lab 10/01/21 1845 10/02/21 0002 10/02/21 0505 10/03/21 0809  NA 148* 147* 150* 140  K 4.5 5.1 5.6* 4.9  CL 117*  --  109 102  CO2 22  --  26 23  GLUCOSE 87  --  93 103*  BUN 51*  --  63* 30*  CREATININE 7.28*  --  9.30* 5.97*  CALCIUM 7.8*  --  9.8 9.1   GFR: Estimated Creatinine Clearance: 8.9 mL/min (A) (by C-G formula based on SCr of 5.97 mg/dL (H)). Liver Function Tests: Recent Labs  Lab 10/01/21 1845  AST 17  ALT 10  ALKPHOS 199*  BILITOT 0.6  PROT 5.1*  ALBUMIN 2.7*   No results for input(s): LIPASE, AMYLASE in the last 168 hours. Recent Labs  Lab 10/01/21 2107  AMMONIA 37*   Coagulation Profile: No results for input(s): INR, PROTIME in the last 168 hours. Cardiac Enzymes: No results for input(s): CKTOTAL, CKMB, CKMBINDEX, TROPONINI in the last 168 hours. BNP (last 3 results) No results for input(s): PROBNP in the last 8760 hours. HbA1C: Recent Labs    10/02/21 0505  HGBA1C 5.8*   CBG: Recent Labs  Lab 10/03/21 1234 10/03/21 1651 10/03/21 2043 10/04/21 0814 10/04/21 1201  GLUCAP 115* 147* 130* 93 91   Lipid Profile: No results for input(s): CHOL, HDL, LDLCALC, TRIG, CHOLHDL, LDLDIRECT in the last 72 hours. Thyroid Function Tests: Recent Labs    10/02/21 0505 10/02/21 0506  TSH 0.241*  --   FREET4  --  0.83  T3FREE  --  1.8*   Anemia Panel: Recent Labs    10/02/21 0506  VITAMINB12 463   Sepsis Labs: No results for input(s): PROCALCITON, LATICACIDVEN in the last 168 hours.  Recent Results (from the past 240 hour(s))  Resp Panel by RT-PCR (Flu A&B, Covid) Nasopharyngeal Swab     Status: None   Collection Time: 10/01/21  6:45 PM   Specimen: Nasopharyngeal Swab; Nasopharyngeal(NP) swabs in vial transport medium  Result Value Ref Range Status   SARS Coronavirus 2 by RT PCR NEGATIVE NEGATIVE  Final    Comment: (NOTE) SARS-CoV-2 target nucleic acids are NOT DETECTED.  The SARS-CoV-2 RNA is generally detectable in upper respiratory specimens during the acute phase of infection. The lowest concentration of SARS-CoV-2 viral copies this assay can detect is 138 copies/mL. A negative result does not preclude SARS-Cov-2 infection and should not be used as the sole basis for treatment or other patient management decisions. A negative result may occur with  improper specimen collection/handling, submission of specimen other than nasopharyngeal swab, presence of viral mutation(s) within the areas targeted by this assay, and inadequate number of viral copies(<138 copies/mL). A negative result must be combined with clinical observations, patient history, and epidemiological information. The expected result is Negative.  Fact Sheet for Patients:  EntrepreneurPulse.com.au  Fact Sheet  for Healthcare Providers:  IncredibleEmployment.be  This test is no t yet approved or cleared by the [place] and  has been authorized for detection and/or diagnosis of SARS-CoV-2 by FDA under an Emergency Use Authorization (EUA). This EUA will remain  in effect (meaning this test can be used) for the duration of the COVID-19 declaration under Section 564(b)(1) of the Act, 21 U.S.C.section 360bbb-3(b)(1), unless the authorization is terminated  or revoked sooner.       Influenza A by PCR NEGATIVE NEGATIVE Final   Influenza B by PCR NEGATIVE NEGATIVE Final    Comment: (NOTE) The Xpert Xpress SARS-CoV-2/FLU/RSV plus assay is intended as an aid in the diagnosis of influenza from Nasopharyngeal swab specimens and should not be used as a sole basis for treatment. Nasal washings and aspirates are unacceptable for Xpert Xpress SARS-CoV-2/FLU/RSV testing.  Fact Sheet for Patients: EntrepreneurPulse.com.au  Fact Sheet for Healthcare  Providers: IncredibleEmployment.be  This test is not yet approved or cleared by the [place] FDA and has been authorized for detection and/or diagnosis of SARS-CoV-2 by FDA under an Emergency Use Authorization (EUA). This EUA will remain in effect (meaning this test can be used) for the duration of the COVID-19 declaration under Section 564(b)(1) of the Act, 21 U.S.C. section 360bbb-3(b)(1), unless the authorization is terminated or revoked.  Performed at [facility], [place] [address], [place], [place] [address]   Urine Culture     Status: Abnormal   Collection Time: 10/02/21  6:13 PM   Specimen: Urine, Clean Catch  Result Value Ref Range Status   Specimen Description URINE, CLEAN CATCH  Final   Special Requests   Final    NONE Performed at [facility], [place] [address], [place], [place] [address]    Culture MULTIPLE SPECIES PRESENT, SUGGEST RECOLLECTION (A)  Final   Report Status 10/03/2021 FINAL  Final         Radiology Studies: No results found.      Scheduled Meds:  (feeding supplement) PROSource Plus  30 mL Oral BID BM   Chlorhexidine Gluconate Cloth  6 each Topical Q0600   cinacalcet  30 mg Oral Q supper   doxercalciferol  3 mcg Intravenous Q M,W,F-HD   feeding supplement (NEPRO CARB STEADY)  237 mL Oral TID WC   multivitamin  1 tablet Oral QHS   pantoprazole (PROTONIX) IV  40 mg Intravenous Q12H   Continuous Infusions:  sodium chloride     sodium chloride     cefTRIAXone (ROCEPHIN)  IV 1 g (10/04/21 0828)     LOS: 1 day     Phillips Climes, MD [facility]   If 7PM-7AM, please contact night-coverage [email or web address]  10/04/2021, 1:02 PM  Patient ID: [name], male   DOB: [birth year], 81 y.o.   MRN: [id number]

## 2021-10-04 NOTE — Progress Notes (Signed)
Pt very confused and angry unconsolable and unable to reoirent, call to wife and she spoke to pt and nurse . Wife refusing cat scan for her husband stated it can be done tomorrow because he has confusion at night . Wife was able to talk pt into taking his night time medication. But pt remains obstinant that he wants to put his pants on and go, states he cannot stand [place] trashing his house and he has appointment to go to and needs help to leave now. Bilateral soft wrist restraints on and will continue to monitor for confusion and safety. Bed alarm on.

## 2021-10-04 NOTE — TOC Progression Note (Signed)
Transition of Care [facility]) - Progression Note    Patient Details  Name: [name] MRN: [id number] Date of Birth: 25-Sep-1940  [facility]) CM/SW Contact  Cyndi Bender, RN Phone Number: 10/04/2021, 2:31 PM  Clinical Narrative:    Spoke to wife, [name], regarding transition needs. Wife states they have used [facility] in the past but would like to try a different agency. Wife defers to [facility] to find agency. Spoke to [place] with [facility] and referral accepted for HH-PT/OT. Wife states they have all the DME needed. Wife states she can transport patient home once discharged.    Expected Discharge Plan: [place] Barriers to Discharge: Continued Medical Work up  Expected Discharge Plan and Services Expected Discharge Plan: [place]   Discharge Planning Services: CM Consult Post Acute Care Choice: [place] arrangements for the past 2 months: Single Family Home                           HH Arranged: PT, OT HH Agency: [place] Date [place]: 10/04/21 Time HH Agency Contacted: 1430 Representative spoke with at [place]: [place] ([place]) Interventions    Readmission Risk Interventions No flowsheet data found.

## 2021-10-04 NOTE — Progress Notes (Signed)
SPOKE TO CHARGE NURSES REGARDING NEED FOR IV SITE AND WHERE IT IS OK TO PLACE AN IV DUE TO BILATERAL FISTUALA BOTH ARMS FOR DIALYSIS AS WELL AS RIGHT CHEST WALL HEMODIALYSIS CATH PRESENT. CALL TO DR Carolin Sicks ON CALL TONIGHT AND RECEIVED ORDER TO USE LEFT ARM ONLY DO NOT USE RIGHT ARM TO PLACE ANY IVS. ORDER FOR NURSING COMMUNICATION WRITTEN. CHARGE NURSE ALSO NOTIFIED OF MD DECISION AND NEW CONSULT FOR IV TEAM TO START IV FOR CT SCAN PROCEDURE NEEDED

## 2021-10-04 NOTE — Plan of Care (Signed)
°  Problem: Safety: Goal: Non-violent Restraint(s) Outcome: Progressing   Problem: Education: Goal: Knowledge of General Education information will improve Description: Including pain rating scale, medication(s)/side effects and non-pharmacologic comfort measures Outcome: Progressing   Problem: Health Behavior/Discharge Planning: Goal: Ability to manage health-related needs will improve Outcome: Progressing   Problem: Clinical Measurements: Goal: Ability to maintain clinical measurements within normal limits will improve Outcome: Progressing   Problem: Activity: Goal: Risk for activity intolerance will decrease Outcome: Progressing   Problem: Safety: Goal: Ability to remain free from injury will improve Outcome: Progressing

## 2021-10-04 NOTE — Progress Notes (Signed)
[facility] Progress Note   Subjective: Seen in room. NAD. HD today on schedule.    Objective Vitals:   10/03/21 2046 10/03/21 2331 10/04/21 0000 10/04/21 0805  BP: (!) 141/55 (!) 131/50 (!) 130/51 (!) 106/51  Pulse: 69 70 69 65  Resp: 20 18 17 20   Temp: 98 F (36.7 C) 98.3 F (36.8 C) 98.4 F (36.9 C) 97.6 F (36.4 C)  TempSrc: Oral Oral Oral Oral  SpO2:  95% 98% 100%  Weight:      Height:       Physical Exam General: Pleasant elderly male in NAD Neuro: Oriented X 1. Cooperative.  Heart: S1 S2 RRR No M/R/G Lungs: CTAB Abdomen: NABS, NT Extremities:No LE edema.  Dialysis Access: R AVF+ T/B RIJ TDC drsg intact   Additional Objective Labs: Basic Metabolic Panel: Recent Labs  Lab 10/01/21 1845 10/02/21 0002 10/02/21 0505 10/03/21 0809  NA 148* 147* 150* 140  K 4.5 5.1 5.6* 4.9  CL 117*  --  109 102  CO2 22  --  26 23  GLUCOSE 87  --  93 103*  BUN 51*  --  63* 30*  CREATININE 7.28*  --  9.30* 5.97*  CALCIUM 7.8*  --  9.8 9.1   Liver Function Tests: Recent Labs  Lab 10/01/21 1845  AST 17  ALT 10  ALKPHOS 199*  BILITOT 0.6  PROT 5.1*  ALBUMIN 2.7*   No results for input(s): LIPASE, AMYLASE in the last 168 hours. CBC: Recent Labs  Lab 10/01/21 1845 10/02/21 0002 10/02/21 0505 10/03/21 0809  WBC 7.2  --  9.6 8.7  HGB 10.4* 11.6* 12.2* 12.6*  HCT 34.5* 34.0* 38.8* 37.8*  MCV 108.2*  --  105.7* 101.1*  PLT 121*  --  140* 119*   Blood Culture    Component Value Date/Time   SDES URINE, CLEAN CATCH 10/02/2021 1813   SPECREQUEST  10/02/2021 1813    NONE Performed at [facility], [place] [address] [place], [place] [address]    CULT MULTIPLE SPECIES PRESENT, SUGGEST RECOLLECTION (A) 10/02/2021 1813   REPTSTATUS 10/03/2021 FINAL 10/02/2021 1813    Cardiac Enzymes: No results for input(s): CKTOTAL, CKMB, CKMBINDEX, TROPONINI in the last 168 hours. CBG: Recent Labs  Lab 10/03/21 0749 10/03/21 1234 10/03/21 1651  10/03/21 2043 10/04/21 0814  GLUCAP 87 115* 147* 130* 93   Iron Studies: No results for input(s): IRON, TIBC, TRANSFERRIN, FERRITIN in the last 72 hours. @lablastinr3 @ Studies/Results: No results found. Medications:  sodium chloride     sodium chloride     cefTRIAXone (ROCEPHIN)  IV 1 g (10/04/21 0828)    (feeding supplement) PROSource Plus  30 mL Oral BID BM   Chlorhexidine Gluconate Cloth  6 each Topical Q0600   cinacalcet  30 mg Oral Q supper   doxercalciferol  3 mcg Intravenous Q M,W,F-HD   feeding supplement (NEPRO CARB STEADY)  237 mL Oral TID WC   multivitamin  1 tablet Oral QHS   pantoprazole (PROTONIX) IV  40 mg Intravenous Q12H   Dialysis Orders:[facility] MWF  4 hrs 180NRE 400/Autoflow 1.5 64.5 kg 3.0K/2.5 Ca UFP 2 AVF -No heparin  -No ESA/Venofer -Hectorol 3 mcg IV TIW  Assessment/Plan:  Acute Encephalopathy in setting of pre-existing dementia. Recently started on Korsuva. Will DC drug at OP center. Difficult to distinguish uremia from dementia but no Asterixis. SCr 9.3 BUN 63 on admission. No missed treatments but has truncated a few. Does  appear more confused than  usual today than normally seen at OP HD unit.   Mild hyperkalemia: Has been elevated at OP center on 3.0 K bath. Changed to 2.0 K bath.  K+ 4.9 . Next HD 10/04/2021 Hypernatremia: Na 150 on admission. Resolved with HD.   ESRD -  MWF. Next HD 10/04/2021. Needs TDC removed.   Hypertension/volume  - BP better controlled today. No excess volume by exam.  No BP meds on OP med list. Usually takes midodrine with HD. Hold midodrine for now. HD 01/09 now slightly below OP EDW. UF as tolerated.   Anemia  - HGB 12.6. No ESA as OP. Follow HGB.   Metabolic bone disease -  Last OP labs at goal 09/27/2021. Continue renvela binders, VDRA. Sensipar on OP med list, not on home med list. Will continue here.  Nutrition -Albumin low. Renal/Carb Mod diet. Start protein supps, renal vit. DMT2-per primary     Jimmye Norman. Ainhoa Rallo  NP-C 10/04/2021, 11:53 AM  [employer] [phone]

## 2021-10-04 NOTE — Progress Notes (Signed)
Patient ID: [name], male   DOB: July 09, 1941, 81 y.o.   MRN: [id number] Patient reports some nausea, feeling he wants to vomit during end of dialysis, as well he has complaints of abdominal pain, he does have right upper quadrant and suprapubic tenderness on physical exam, vital signs are stable, will obtain CT abdomen pelvis with IV contrast, will receive Zofran and 1 dose of fentanyl. Phillips Climes MD

## 2021-10-05 ENCOUNTER — Inpatient Hospital Stay (HOSPITAL_COMMUNITY): Payer: Medicare Other

## 2021-10-05 ENCOUNTER — Telehealth: Payer: Self-pay | Admitting: Internal Medicine

## 2021-10-05 ENCOUNTER — Encounter: Payer: Medicare Other | Admitting: Psychology

## 2021-10-05 DIAGNOSIS — G309 Alzheimer's disease, unspecified: Secondary | ICD-10-CM | POA: Diagnosis not present

## 2021-10-05 DIAGNOSIS — G9340 Encephalopathy, unspecified: Secondary | ICD-10-CM | POA: Diagnosis not present

## 2021-10-05 DIAGNOSIS — E870 Hyperosmolality and hypernatremia: Secondary | ICD-10-CM | POA: Diagnosis not present

## 2021-10-05 DIAGNOSIS — R911 Solitary pulmonary nodule: Secondary | ICD-10-CM

## 2021-10-05 LAB — GLUCOSE, CAPILLARY
Glucose-Capillary: 100 mg/dL — ABNORMAL HIGH (ref 70–99)
Glucose-Capillary: 124 mg/dL — ABNORMAL HIGH (ref 70–99)
Glucose-Capillary: 134 mg/dL — ABNORMAL HIGH (ref 70–99)
Glucose-Capillary: 158 mg/dL — ABNORMAL HIGH (ref 70–99)
Glucose-Capillary: 216 mg/dL — ABNORMAL HIGH (ref 70–99)

## 2021-10-05 MED ORDER — INSULIN ASPART 100 UNIT/ML IJ SOLN: 0.0000 [IU] | Freq: Three times a day (TID) | INTRAMUSCULAR | Status: DC

## 2021-10-05 MED ORDER — IOHEXOL 300 MG/ML  SOLN
100.0000 mL | Freq: Once | INTRAMUSCULAR | Status: AC
  Administered 2021-10-05: 75 mL via INTRAVENOUS

## 2021-10-05 NOTE — Progress Notes (Signed)
[facility] Progress Note   Subjective:Still confused but says he is going home today which is accurate. Next HD tomorrow at OP center.   Objective Vitals:   10/05/21 0500 10/05/21 0600 10/05/21 0736 10/05/21 1209  BP:   (!) 122/53 (!) 139/49  Pulse: 71 70 64 66  Resp: 16 20 17 16   Temp:   97.7 F (36.5 C) (!) 97.5 F (36.4 C)  TempSrc:   Oral Oral  SpO2: 95%  96% 98%  Weight:      Height:       Physical Exam General: Pleasant elderly male in NAD Neuro: Oriented X 1. Cooperative.  Heart: S1 S2 RRR No M/R/G Lungs: CTAB Abdomen: NABS, NT Extremities:No LE edema.  Dialysis Access: R AVF+ T/B RIJ TDC drsg intact   Additional Objective Labs: Basic Metabolic Panel: Recent Labs  Lab 10/01/21 1845 10/02/21 0002 10/02/21 0505 10/03/21 0809  NA 148* 147* 150* 140  K 4.5 5.1 5.6* 4.9  CL 117*  --  109 102  CO2 22  --  26 23  GLUCOSE 87  --  93 103*  BUN 51*  --  63* 30*  CREATININE 7.28*  --  9.30* 5.97*  CALCIUM 7.8*  --  9.8 9.1   Liver Function Tests: Recent Labs  Lab 10/01/21 1845  AST 17  ALT 10  ALKPHOS 199*  BILITOT 0.6  PROT 5.1*  ALBUMIN 2.7*   No results for input(s): LIPASE, AMYLASE in the last 168 hours. CBC: Recent Labs  Lab 10/01/21 1845 10/02/21 0002 10/02/21 0505 10/03/21 0809  WBC 7.2  --  9.6 8.7  HGB 10.4* 11.6* 12.2* 12.6*  HCT 34.5* 34.0* 38.8* 37.8*  MCV 108.2*  --  105.7* 101.1*  PLT 121*  --  140* 119*   Blood Culture    Component Value Date/Time   SDES URINE, CLEAN CATCH 10/02/2021 1813   SPECREQUEST  10/02/2021 1813    NONE Performed at [facility], [address] [place], [place] [address]    CULT MULTIPLE SPECIES PRESENT, SUGGEST RECOLLECTION (A) 10/02/2021 1813   REPTSTATUS 10/03/2021 FINAL 10/02/2021 1813    Cardiac Enzymes: No results for input(s): CKTOTAL, CKMB, CKMBINDEX, TROPONINI in the last 168 hours. CBG: Recent Labs  Lab 10/05/21 0006 10/05/21 0354 10/05/21 2751  10/05/21 0737 10/05/21 1209  GLUCAP 158* 216* 124* 100* 134*   Iron Studies: No results for input(s): IRON, TIBC, TRANSFERRIN, FERRITIN in the last 72 hours. @lablastinr3 @ Studies/Results: MR BRAIN WO CONTRAST  Addendum Date: 10/05/2021   ADDENDUM REPORT: 10/05/2021 10:35 ADDENDUM: Omitted impression #4 of slow or absent flow in the right V4 segment, usually chronic in isolation. Electronically Signed   By: Jorje Guild M.D.   On: 10/05/2021 10:35   Result Date: 10/05/2021 CLINICAL DATA:  Delirium and altered mental status. Acute cephalopathy in setting of pre-existing dementia. EXAM: MRI HEAD WITHOUT CONTRAST TECHNIQUE: Multiplanar, multiecho pulse sequences of the brain and surrounding structures were obtained without intravenous contrast. COMPARISON:  Head CT 10/01/2021 FINDINGS: Brain: No acute infarction, hemorrhage, hydrocephalus, or mass. Mild chronic small vessel ischemia in the periventricular white matter. Chronic bilateral subdural collection along the hemispheric convexities, up to 7 mm in thickness in the left frontal region. No associated mass effect given underlying brain atrophy. Prior burr holes bilaterally, presumably for drainage. Small remote left cerebellar infarct. Vascular: Absent right vertebral flow void. Skull and upper cervical spine: Bilateral calvarial burr hole. Disc narrowing and T1 hypointensity leftward at C2-3  and also involving the left C2-3 facet, new from February 2022 cervical spine CT. This is likely degenerative given the partial involvement of the disc space. Involvement of both disc and facet argues against a bone lesion. Mild spinal stenosis at this level. Sinuses/Orbits: Bilateral cataract resection. Partial right mastoid opacification. IMPRESSION: 1. No acute intracranial finding.  No acute infarct. 2. Generalized brain atrophy. Chronic bilateral subdural collection without cerebral mass effect. 3. Suggested marrow edema/active inflammation at the C2-3  leftward disc space and facet, new from February 2022 CT. Electronically Signed: By: Jorje Guild M.D. On: 10/05/2021 10:22   CT ABDOMEN PELVIS W CONTRAST  Result Date: 10/05/2021 CLINICAL DATA:  81 year old male with history of right lower quadrant abdominal pain. Nausea. EXAM: CT ABDOMEN AND PELVIS WITH CONTRAST TECHNIQUE: Multidetector CT imaging of the abdomen and pelvis was performed using the standard protocol following bolus administration of intravenous contrast. CONTRAST:  62mL OMNIPAQUE IOHEXOL 300 MG/ML  SOLN COMPARISON:  CT the abdomen and pelvis 10/01/2021. FINDINGS: Lower chest: 7 mm ill-defined nodule in the inferior segment of the lingula (axial image 11 of series 5). Areas of cylindrical bronchiectasis and architectural distortion in the lower lungs bilaterally. Atherosclerotic calcifications in the descending thoracic aorta as well as the left anterior descending, left circumflex and right coronary arteries. Central venous catheter tip terminating in the right atrium. Hepatobiliary: Subcentimeter low-attenuation lesions in the liver, too small to characterize, but statistically likely to represent tiny cysts. No other larger more suspicious appearing hepatic lesions. The appearance of the gallbladder is normal. No intra or extrahepatic biliary ductal dilatation. Pancreas: No pancreatic mass. No pancreatic ductal dilatation. No pancreatic or peripancreatic fluid collections or inflammatory changes. Spleen: Unremarkable. Adrenals/Urinary Tract: Severe atrophy of the kidneys bilaterally with numerous low-attenuation lesions, largest of which are compatible with simple cysts, measuring up to 4.0 x 3.2 cm in the lateral aspect of the interpolar region of the left kidney. Subcentimeter low-attenuation lesions in both kidneys are too small to definitively characterize, but also likely to represent tiny cysts. No hydroureteronephrosis. Urinary bladder is nearly decompressed, but otherwise  unremarkable in appearance. Bilateral adrenal glands are normal in appearance. Stomach/Bowel: The appearance of the stomach is normal. No pathologic dilatation of small bowel or colon. Normal appendix. Vascular/Lymphatic: Aortic atherosclerosis. Status post aortobifemoral bypass graft, which appears patent. No lymphadenopathy noted in the abdomen or pelvis. Reproductive: Prostate gland and seminal vesicles are unremarkable in appearance. Other: No significant volume of ascites.  No pneumoperitoneum. Musculoskeletal: Status post mesh repair for ventral hernia. Status post ORIF in the right proximal femur. Chronic compression fractures of L1, L2, L3, L4 and L5, most severe at L1 where there is a vertebral plana appearance, similar to the prior study, with post vertebroplasty changes noted in the superior aspect of L3. There are no aggressive appearing lytic or blastic lesions noted in the visualized portions of the skeleton. IMPRESSION: 1. No acute findings are noted in the abdomen or pelvis to account for the patient's symptoms. 2. Severe atrophy of the kidneys bilaterally with multiple small cystic appearing lesions, similar to prior studies, as above. 3. Severe aortic atherosclerosis as well as three-vessel coronary artery disease. 4. 7 mm ill-defined nodule in the inferior segment of the lingula, only slightly more prominent when compared to prior studies dating back to 2019. Specifically, this nodule previously appeared more ground-glass attenuation in appearance and is now slightly more solid in appearance. The possibility of a slow growing indolent neoplasm such as a primary bronchogenic  adenocarcinoma should be considered. At this time, repeat noncontrast chest CT is recommended in 12 months to re-evaluate this finding. 5. Bronchiectasis and scarring noted in the lung bases bilaterally, increased compared to prior examinations. 6. Additional incidental findings, as above. Electronically Signed   By: Vinnie Langton M.D.   On: 10/05/2021 05:28   Medications:  cefTRIAXone (ROCEPHIN)  IV 1 g (10/05/21 0810)    (feeding supplement) PROSource Plus  30 mL Oral BID BM   allopurinol  100 mg Oral Daily   Chlorhexidine Gluconate Cloth  6 each Topical Q0600   cinacalcet  30 mg Oral Q supper   donepezil  10 mg Oral QHS   doxercalciferol  3 mcg Intravenous Q M,W,F-HD   feeding supplement (NEPRO CARB STEADY)  237 mL Oral TID WC   feeding supplement (NEPRO CARB STEADY)  237 mL Oral Daily   heparin injection (subcutaneous)  5,000 Units Subcutaneous Q8H   insulin aspart  0-6 Units Subcutaneous TID AC & HS   levothyroxine  50 mcg Oral Q0600   memantine  10 mg Oral BID   [START ON 10/06/2021] midodrine  5 mg Oral Q M,W,F-HD   multivitamin  1 tablet Oral QHS   pantoprazole  40 mg Oral Daily   sevelamer carbonate  2,400 mg Oral 3 times per day on Sun Tue Thu Sat   [START ON 10/06/2021] sevelamer carbonate  2,400 mg Oral 2 times per day on Mon Wed Fri   simvastatin  20 mg Oral QHS     Dialysis Orders:[facility] M[facility]  4 hrs 180NRE 400/Autoflow 1.5 64.5 kg 3.0K/2.5 Ca UFP 2 AVF -No heparin  -No ESA/Venofer -Hectorol 3 mcg IV TIW   Assessment/Plan:  Acute Encephalopathy in setting of pre-existing dementia. Recently started on Korsuva. Will DC drug at OP center. Difficult to distinguish uremia from dementia but no Asterixis. SCr 9.3 BUN 63 on admission. No missed treatments but has truncated a few. Does  appear more confused than usual today than normally seen at OP HD unit.   Mild hyperkalemia: Has been elevated at OP center on 3.0 K bath. Changed to 2.0 K bath.  K+ 4.9 . Next HD 10/04/2021 Hypernatremia: Na 150 on admission. Resolved with HD.   ESRD -  MWF. Next HD 10/06/2021 at [place].   Hypertension/volume  - BP better controlled today. No excess volume by exam.  No BP meds on OP med list. Usually takes midodrine with HD. Hold midodrine for now. HD 01/09 now slightly below OP EDW. UF as tolerated.   Anemia  -  HGB 12.6. No ESA as OP. Follow HGB.   Metabolic bone disease -  Last OP labs at goal 09/27/2021. Continue renvela binders, VDRA. Sensipar on OP med list, not on home med list. Will continue here.  Nutrition -Albumin low. Renal/Carb Mod diet. Start protein supps, renal vit. DMT2-per primary  Disposition: Home today with HH. Next HD 10/06/2021 at [place].   Otha Rickles H. Corie Allis NP-C 10/05/2021, 1:20 PM  [employer] [phone]

## 2021-10-05 NOTE — Consult Note (Signed)
° °  [facility] Inpatient Consult   10/05/2021  RAKESH DUTKO June 29, 1941 [id number]  [facility] [ACO] Patient: Medicare  Primary Care Provider:  Elby Showers, MD   Patient screened for hospitalization with noted extreme high risk score for unplanned readmission risk and to assess for potential [facility] service needs for post hospital transition.  Review of patient's medical record reveals patient has had [facility] services in the past and then became active with [facility] Palliative care program and reviewed in encounters for ongoing follow up noted.   Plan:  No post hospital follow up assessed as he continues with [facility].    For questions contact:   Natividad Brood, RN BSN [facility] Liaison  [phone] business mobile phone Toll free office [phone]  Fax number: [phone] [email or web address]

## 2021-10-05 NOTE — Discharge Summary (Signed)
Physician Discharge Summary  DYSHON PHILBIN OPF:[id number] DOB: August 27, 1941 DOA: 10/01/2021  PCP: Elby Showers, MD  Admit date: 10/01/2021 Discharge date: 10/05/2021  Admitted From: Home Disposition:  Home   Recommendations for Outpatient Follow-up:  Follow up with PCP in 1-2 weeks Please obtain BMP/CBC in one week Recheck TSH and free T4 in 4 to 6 weeks. Patient will need noncontrast CT chest in 12 months to reevaluate for finding of 7 mm ill-defined nodule in the inferior segment of the lingula.  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  Patient has had poor oral intake over the past couple of months due to dementia as described in palliative care notes.  Presented to the ED with worsening confusion that baseline for about 1 week.  Patient complaining of abdominal pain and suprapubic pain.  CT abdomen and pelvis was negative for acute finding.    Acute Metabolic Encephalopathy: -This is most likely medication related, as he is recently started on Korsuva , which may contribute to altered mental status, as well he is with some underlying dementia, other factors can be considered here, including uremia, as BUN was 63 on admission with a creatinine of 9.3, infectious etiology is possible given positive pyuria, but unfortunately Hauf cultures were unhelpful as growing multiple species. -His encephalopathy was complicated during hospital stay by severe delirium, mainly at nighttime, but this has improved, this morning he is pleasant, and mentation back to baseline. -Continue to hold Eden on discharge, and would recommend avoiding it in the future. -Was appropriately dialyzed during hospital stay, most recent BUN is 30, creatinine is 5.9 -Was treated total of 3 days of Rocephin for  presumed UTI. -CT head and MRI brain with no acute findings -Ammonia at 37, not significantly elevated - TSH  is low at 0.2, but his free T4 is within normal limit at 0.8, so likely sick euthyroid syndrome, need to be repeated as an outpatient. -B 12. 463. -Mentation back to baseline.  Possible UTI -Please see above discussion, treated with Rocephin x3 days  Hospital delirium -Please see above discussion, mentation back to baseline at time of discharge   Hypernatremia: Suspect related to poor oral intake significantly elevated at 150 at one-point this has improved with dialysis management, most recent is 140, Management with HD.   ESRD:  - On hemodialysis.  Nephrology has been consulted, patient was dialyzed 1/11, to resume hemodialysis tomorrow on Monday Wednesday Friday schedule   Alzheimer's disease -Patient with known baseline dementia, continue with supportive care.  Continue with home medications on discharge   Abdominal pain: -Was treated with PPI during hospital stay, abdomen and pelvis with admission with no acute findings, during HD session 1/11 yesterday he had complaint of another episode of right lower quadrant pain, repeat so I have obtained another CT abdomen and pelvis, but this time with IV contrast, still no findings to explain his abdominal pain.   -He denies any complaints of abdominal pain or nausea today.   7 mm ill-defined nodule in the inferior segment of the lingula, - only slightly more prominent when compared to prior studies dating back to 2019. Specifically, this nodule previously appeared more ground-glass attenuation in appearance and is now slightly more solid in appearance. The possibility of a slow growing indolent neoplasm such as a primary bronchogenic adenocarcinoma should  be considered. At this time, repeat noncontrast chest CT is recommended in 12 months to re-evaluate this finding.       Discharge Diagnoses:  Principal Problem:   Acute  encephalopathy Active Problems:   End stage renal disease (HCC)   Alzheimer's disease, unspecified (CODE) ([name])   Hypernatremia    Discharge Instructions  Discharge Instructions     Diet - low sodium heart healthy   Complete by: As directed    Discharge instructions   Complete by: As directed    Follow with Primary MD Elby Showers, MD in 10  days   Get CBC, CMP, checked  by Primary MD next visit.    Activity: As tolerated with Full fall precautions use walker/cane & assistance as needed   Disposition Home    Diet: Renal/Crab modified   On your next visit with your primary care physician please Get Medicines reviewed and adjusted.   Please request your Prim.MD to go over all Hospital Tests and Procedure/Radiological results at the follow up, please get all Hospital records sent to your Prim MD by signing hospital release before you go home.   If you experience worsening of your admission symptoms, develop shortness of breath, life threatening emergency, suicidal or homicidal thoughts you must seek medical attention immediately by calling 911 or calling your MD immediately  if symptoms less severe.  You Must read complete instructions/literature along with all the possible adverse reactions/side effects for all the Medicines you take and that have been prescribed to you. Take any new Medicines after you have completely understood and accpet all the possible adverse reactions/side effects.   Do not drive, operating heavy machinery, perform activities at heights, swimming or participation in water activities or provide baby sitting services if your were admitted for syncope or siezures until you have seen by Primary MD or a Neurologist and advised to do so again.  Do not drive when taking Pain medications.    Do not take more than prescribed Pain, Sleep and Anxiety Medications  Special Instructions: If you have smoked or chewed Tobacco  in the last 2 yrs please stop  smoking, stop any regular Alcohol  and or any Recreational drug use.  Wear Seat belts while driving.   Please note  You were cared for by a hospitalist during your hospital stay. If you have any questions about your discharge medications or the care you received while you were in the hospital after you are discharged, you can call the unit and asked to speak with the hospitalist on call if the hospitalist that took care of you is not available. Once you are discharged, your primary care physician will handle any further medical issues. Please note that NO REFILLS for any discharge medications will be authorized once you are discharged, as it is imperative that you return to your primary care physician (or establish a relationship with a primary care physician if you do not have one) for your aftercare needs so that they can reassess your need for medications and monitor your lab values.   Increase activity slowly   Complete by: As directed       Allergies as of 10/05/2021       Reactions   Ambien [zolpidem Tartrate] Other (See Comments)   Hallucinations and "felt crazy"    Penicillins Rash, Hives   Has patient had a PCN reaction causing immediate rash, facial/tongue/throat swelling, SOB or lightheadedness with hypotension: Yes Has patient had a PCN reaction causing severe  rash involving mucus membranes or skin necrosis: Yes Has patient had a PCN reaction that required hospitalization: No Has patient had a PCN reaction occurring within the last 10 years: No If all of the above answers are "NO", then may proceed with Cephalosporin use. Other reaction(s): HIVES        Medication List     TAKE these medications    acetaminophen 325 MG tablet Commonly known as: TYLENOL Take 1-2 tablets (325-650 mg total) by mouth every 4 (four) hours as needed for mild pain.   albuterol 108 (90 Base) MCG/ACT inhaler Commonly known as: VENTOLIN HFA Inhale 1 puff into the lungs daily as needed for  wheezing or shortness of breath.   allopurinol 100 MG tablet Commonly known as: ZYLOPRIM Take 100 mg by mouth daily.   camphor-menthol lotion Commonly known as: SARNA Apply 1 application topically 4 (four) times daily as needed for itching.   cetirizine 10 MG tablet Commonly known as: ZYRTEC Take 10 mg by mouth daily as needed for allergies.   clobetasol 0.05 % external solution Commonly known as: TEMOVATE Apply 1 application topically 2 (two) times daily as needed (scalp irritation.).   donepezil 10 MG tablet Commonly known as: ARICEPT Take 1 tablet (10 mg total) by mouth at bedtime.   feeding supplement (NEPRO CARB STEADY) Liqd Take 237 mLs by mouth daily.   Fluocinolone Acetonide Scalp 0.01 % Oil Apply 1 application topically daily as needed (scalp irritation.).   fluticasone 50 MCG/ACT nasal spray Commonly known as: FLONASE Place 1 spray into both nostrils daily. What changed:  when to take this reasons to take this   Forteo 600 MCG/2.4ML Sopn Generic drug: Teriparatide (Recombinant) Inject 20 mcg subcutaneously daily What changed:  how much to take how to take this when to take this additional instructions   FreeStyle Freedom Lite w/Device Kit Use to check blood sugar 2 times per day dx code E11.65   freestyle lancets Use as instructed to check blood sugar 2 times per day dx code E11.65   FREESTYLE LITE test strip Generic drug: glucose blood USE AS DIRECTED THREE TIMES DAILY What changed: additional instructions   hydrocortisone 2.5 % cream Apply 1 application topically 3 (three) times daily as needed (skin irritation (legs & arms)).   ketoconazole 2 % shampoo Commonly known as: Nizoral Apply 1 application topically 2 (two) times a week.   levothyroxine 50 MCG tablet Commonly known as: SYNTHROID Take 1 tablet (50 mcg total) by mouth daily. What changed: when to take this   lidocaine 5 % Commonly known as: LIDODERM Place 1 patch onto the skin  daily as needed (back pain).   memantine 10 MG tablet Commonly known as: NAMENDA Take 1 tablet (10 mg total) by mouth 2 (two) times daily.   midodrine 10 MG tablet Commonly known as: PROAMATINE Take one pill prior to hemodialysis on MWF What changed:  how much to take how to take this when to take this additional instructions   omeprazole 40 MG capsule Commonly known as: PRILOSEC Take 1 capsule (40 mg total) by mouth daily.   Pen Needles 32G X 4 MM Misc 1 each by Does not apply route 3 (three) times daily. USE PEN NEEDLES TO INJECT INSULIN THREE TIMES DAILY.   sevelamer carbonate 800 MG tablet Commonly known as: RENVELA Take 1,600-2,400 mg by mouth See admin instructions. Take 2,400 mg by mouth three times a day with meals and 1,600 mg with snacks on Sun/Tues/Thurs/Sat and 2,400 mg  with breakfast and dinner and nothing with snacks on Mon/Wed/Fri   simvastatin 20 MG tablet Commonly known as: ZOCOR Take 20 mg by mouth at bedtime.   Theratears 0.25 % Soln Generic drug: Carboxymethylcellulose Sodium Place 1 drop into both eyes as needed (for dryness).   Tradjenta 5 MG Tabs tablet Generic drug: linagliptin TAKE 1 TABLET DAILY What changed: how much to take        Follow-up Information     Baxley, Cresenciano Lick, MD Follow up.   Specialty: Internal Medicine Contact information: [address] [place] [address] [phone]                Allergies  Allergen Reactions   Ambien [Zolpidem Tartrate] Other (See Comments)    Hallucinations and "felt crazy"    Penicillins Rash and Hives    Has patient had a PCN reaction causing immediate rash, facial/tongue/throat swelling, SOB or lightheadedness with hypotension: Yes Has patient had a PCN reaction causing severe rash involving mucus membranes or skin necrosis: Yes Has patient had a PCN reaction that required hospitalization: No Has patient had a PCN reaction occurring within the last 10 years: No If all of  the above answers are "NO", then may proceed with Cephalosporin use.  Other reaction(s): HIVES    Consultations: Renal   Procedures/Studies: CT ABDOMEN PELVIS WO CONTRAST  Result Date: 10/01/2021 CLINICAL DATA:  Left lower quadrant abdominal pain for the past week. Altered mental status. EXAM: CT ABDOMEN AND PELVIS WITHOUT CONTRAST TECHNIQUE: Multidetector CT imaging of the abdomen and pelvis was performed following the standard protocol without IV contrast. COMPARISON:  CT abdomen and pelvis dated July 03, 2021. FINDINGS: Lower chest: No acute abnormality. Left lower lobe atelectasis. Resolved pleural effusions. Hepatobiliary: No focal liver abnormality is seen. No gallstones, gallbladder wall thickening, or biliary dilatation. Pancreas: Unremarkable. No pancreatic ductal dilatation or surrounding inflammatory changes. Spleen: Normal in size without focal abnormality. Adrenals/Urinary Tract: The adrenal glands are unremarkable. Unchanged bilateral renal atrophy and cysts. No renal calculi or hydronephrosis. The bladder is decompressed. Stomach/Bowel: Stomach is within normal limits. Appendix appears normal. No evidence of bowel wall thickening, distention, or inflammatory changes. Vascular/Lymphatic: Prior aortobifemoral bypass graft. Aortic atherosclerosis. No enlarged abdominal or pelvic lymph nodes. Reproductive: Prostate is unremarkable. Other: Unchanged small fat containing left inguinal hernia. No free fluid or pneumoperitoneum. Musculoskeletal: No acute or significant osseous findings. Unchanged chronic thoracolumbar compression deformities. Chronic partially united right intertrochanteric femur fracture status post ORIF. IMPRESSION: 1. No acute intra-abdominal process. Electronically Signed   By: Titus Dubin M.D.   On: 10/01/2021 21:42   CT Head Wo Contrast  Result Date: 10/01/2021 CLINICAL DATA:  Mental status change, unknown cause Worsening confusion. EXAM: CT HEAD WITHOUT CONTRAST  TECHNIQUE: Contiguous axial images were obtained from the base of the skull through the vertex without intravenous contrast. COMPARISON:  Head CT 11/07/2020 FINDINGS: Brain: Chronic bifrontal hygromas, left greater than right, not significantly changed from prior exam. This measures up to 10 mm in the left in the frontal region, measured on series 4, image 10. Left hygroma is minimally increased in density compared to CSF, but stable from prior exam. No hyperdense component to suggest acute hemorrhage. Generalized atrophy and chronic small vessel ischemia which is stable. No evidence of acute infarct. No midline shift or mass effect. Vascular: Atherosclerosis of skullbase vasculature without hyperdense vessel or abnormal calcification. Skull: Bilateral frontal burr holes.  No fracture or focal lesion. Sinuses/Orbits: Paranasal sinuses and mastoid air cells  are clear. The visualized orbits are unremarkable. Bilateral cataract resection. Other: None. IMPRESSION: 1. No acute intracranial abnormality. 2. Chronic bifrontal hygromas, left greater than right, not significantly changed from prior exam. No hyperdense component to suggest acute hemorrhage. 3. Stable atrophy and chronic small vessel ischemia. Electronically Signed   By: Keith Rake M.D.   On: 10/01/2021 20:17   MR BRAIN WO CONTRAST  Addendum Date: 10/05/2021   ADDENDUM REPORT: 10/05/2021 10:35 ADDENDUM: Omitted impression #4 of slow or absent flow in the right V4 segment, usually chronic in isolation. Electronically Signed   By: Jorje Guild M.D.   On: 10/05/2021 10:35   Result Date: 10/05/2021 CLINICAL DATA:  Delirium and altered mental status. Acute cephalopathy in setting of pre-existing dementia. EXAM: MRI HEAD WITHOUT CONTRAST TECHNIQUE: Multiplanar, multiecho pulse sequences of the brain and surrounding structures were obtained without intravenous contrast. COMPARISON:  Head CT 10/01/2021 FINDINGS: Brain: No acute infarction, hemorrhage,  hydrocephalus, or mass. Mild chronic small vessel ischemia in the periventricular white matter. Chronic bilateral subdural collection along the hemispheric convexities, up to 7 mm in thickness in the left frontal region. No associated mass effect given underlying brain atrophy. Prior burr holes bilaterally, presumably for drainage. Small remote left cerebellar infarct. Vascular: Absent right vertebral flow void. Skull and upper cervical spine: Bilateral calvarial burr hole. Disc narrowing and T1 hypointensity leftward at C2-3 and also involving the left C2-3 facet, new from February 2022 cervical spine CT. This is likely degenerative given the partial involvement of the disc space. Involvement of both disc and facet argues against a bone lesion. Mild spinal stenosis at this level. Sinuses/Orbits: Bilateral cataract resection. Partial right mastoid opacification. IMPRESSION: 1. No acute intracranial finding.  No acute infarct. 2. Generalized brain atrophy. Chronic bilateral subdural collection without cerebral mass effect. 3. Suggested marrow edema/active inflammation at the C2-3 leftward disc space and facet, new from February 2022 CT. Electronically Signed: By: Jorje Guild M.D. On: 10/05/2021 10:22   CT ABDOMEN PELVIS W CONTRAST  Result Date: 10/05/2021 CLINICAL DATA:  81 year old male with history of right lower quadrant abdominal pain. Nausea. EXAM: CT ABDOMEN AND PELVIS WITH CONTRAST TECHNIQUE: Multidetector CT imaging of the abdomen and pelvis was performed using the standard protocol following bolus administration of intravenous contrast. CONTRAST:  72m OMNIPAQUE IOHEXOL 300 MG/ML  SOLN COMPARISON:  CT the abdomen and pelvis 10/01/2021. FINDINGS: Lower chest: 7 mm ill-defined nodule in the inferior segment of the lingula (axial image 11 of series 5). Areas of cylindrical bronchiectasis and architectural distortion in the lower lungs bilaterally. Atherosclerotic calcifications in the descending  thoracic aorta as well as the left anterior descending, left circumflex and right coronary arteries. Central venous catheter tip terminating in the right atrium. Hepatobiliary: Subcentimeter low-attenuation lesions in the liver, too small to characterize, but statistically likely to represent tiny cysts. No other larger more suspicious appearing hepatic lesions. The appearance of the gallbladder is normal. No intra or extrahepatic biliary ductal dilatation. Pancreas: No pancreatic mass. No pancreatic ductal dilatation. No pancreatic or peripancreatic fluid collections or inflammatory changes. Spleen: Unremarkable. Adrenals/Urinary Tract: Severe atrophy of the kidneys bilaterally with numerous low-attenuation lesions, largest of which are compatible with simple cysts, measuring up to 4.0 x 3.2 cm in the lateral aspect of the interpolar region of the left kidney. Subcentimeter low-attenuation lesions in both kidneys are too small to definitively characterize, but also likely to represent tiny cysts. No hydroureteronephrosis. Urinary bladder is nearly decompressed, but otherwise unremarkable in appearance. Bilateral  adrenal glands are normal in appearance. Stomach/Bowel: The appearance of the stomach is normal. No pathologic dilatation of small bowel or colon. Normal appendix. Vascular/Lymphatic: Aortic atherosclerosis. Status post aortobifemoral bypass graft, which appears patent. No lymphadenopathy noted in the abdomen or pelvis. Reproductive: Prostate gland and seminal vesicles are unremarkable in appearance. Other: No significant volume of ascites.  No pneumoperitoneum. Musculoskeletal: Status post mesh repair for ventral hernia. Status post ORIF in the right proximal femur. Chronic compression fractures of L1, L2, L3, L4 and L5, most severe at L1 where there is a vertebral plana appearance, similar to the prior study, with post vertebroplasty changes noted in the superior aspect of L3. There are no aggressive  appearing lytic or blastic lesions noted in the visualized portions of the skeleton. IMPRESSION: 1. No acute findings are noted in the abdomen or pelvis to account for the patient's symptoms. 2. Severe atrophy of the kidneys bilaterally with multiple small cystic appearing lesions, similar to prior studies, as above. 3. Severe aortic atherosclerosis as well as three-vessel coronary artery disease. 4. 7 mm ill-defined nodule in the inferior segment of the lingula, only slightly more prominent when compared to prior studies dating back to 2019. Specifically, this nodule previously appeared more ground-glass attenuation in appearance and is now slightly more solid in appearance. The possibility of a slow growing indolent neoplasm such as a primary bronchogenic adenocarcinoma should be considered. At this time, repeat noncontrast chest CT is recommended in 12 months to re-evaluate this finding. 5. Bronchiectasis and scarring noted in the lung bases bilaterally, increased compared to prior examinations. 6. Additional incidental findings, as above. Electronically Signed   By: Vinnie Langton M.D.   On: 10/05/2021 05:28   DG Chest Portable 1 View  Result Date: 10/01/2021 CLINICAL DATA:  Altered mental status. EXAM: PORTABLE CHEST 1 VIEW COMPARISON:  Radiograph 07/03/2021 FINDINGS: Right-sided dialysis catheter in place. Chronic cardiomegaly with equivocal increase. Aortic atherosclerosis and tortuosity, stable. Slight increase in interstitial thickening from prior exam. Slight blunting of both costophrenic angle suggests small effusions. No confluent consolidation. No pneumothorax. No acute osseous abnormalities are seen. IMPRESSION: Chronic cardiomegaly with equivocal increase. Slight progression in interstitial thickening from prior exam, pulmonary edema versus atypical infection. Possible small pleural effusions. Electronically Signed   By: Keith Rake M.D.   On: 10/01/2021 19:29      Subjective:  Patient  denies any chest pain, shortness of breath this morning, he denies any complaints this morning, no nausea, no vomiting, no abdominal pain. -Patient had delirium overnight, this has resolved this morning. Discharge Exam: Vitals:   10/05/21 0600 10/05/21 0736  BP:  (!) 122/53  Pulse: 70 64  Resp: 20 17  Temp:  97.7 F (36.5 C)  SpO2:  96%   Vitals:   10/05/21 0400 10/05/21 0500 10/05/21 0600 10/05/21 0736  BP: (!) 108/36   (!) 122/53  Pulse: 71 71 70 64  Resp: 17 16 20 17   Temp:    97.7 F (36.5 C)  TempSrc:    Oral  SpO2: 97% 95%  96%  Weight:      Height:        General: Pt is alert, awake, is demented at baseline, pleasant, communicative Cardiovascular: RRR, S1/S2 +, no rubs, no gallops Respiratory: CTA bilaterally, no wheezing, no rhonchi Abdominal: Soft, NT, ND, bowel sounds + Extremities: no edema, no cyanosis    The results of significant diagnostics from this hospitalization (including imaging, microbiology, ancillary and laboratory) are listed below for  reference.     Microbiology: Recent Results (from the past 240 hour(s))  Resp Panel by RT-PCR (Flu A&B, Covid) Nasopharyngeal Swab     Status: None   Collection Time: 10/01/21  6:45 PM   Specimen: Nasopharyngeal Swab; Nasopharyngeal(NP) swabs in vial transport medium  Result Value Ref Range Status   SARS Coronavirus 2 by RT PCR NEGATIVE NEGATIVE Final    Comment: (NOTE) SARS-CoV-2 target nucleic acids are NOT DETECTED.  The SARS-CoV-2 RNA is generally detectable in upper respiratory specimens during the acute phase of infection. The lowest concentration of SARS-CoV-2 viral copies this assay can detect is 138 copies/mL. A negative result does not preclude SARS-Cov-2 infection and should not be used as the sole basis for treatment or other patient management decisions. A negative result may occur with  improper specimen collection/handling, submission of specimen other than nasopharyngeal swab, presence of  viral mutation(s) within the areas targeted by this assay, and inadequate number of viral copies(<138 copies/mL). A negative result must be combined with clinical observations, patient history, and epidemiological information. The expected result is Negative.  Fact Sheet for Patients:  EntrepreneurPulse.com.au  Fact Sheet for Healthcare Providers:  IncredibleEmployment.be  This test is no t yet approved or cleared by the [place] FDA and  has been authorized for detection and/or diagnosis of SARS-CoV-2 by FDA under an Emergency Use Authorization (EUA). This EUA will remain  in effect (meaning this test can be used) for the duration of the COVID-19 declaration under Section 564(b)(1) of the Act, 21 U.S.C.section 360bbb-3(b)(1), unless the authorization is terminated  or revoked sooner.       Influenza A by PCR NEGATIVE NEGATIVE Final   Influenza B by PCR NEGATIVE NEGATIVE Final    Comment: (NOTE) The Xpert Xpress SARS-CoV-2/FLU/RSV plus assay is intended as an aid in the diagnosis of influenza from Nasopharyngeal swab specimens and should not be used as a sole basis for treatment. Nasal washings and aspirates are unacceptable for Xpert Xpress SARS-CoV-2/FLU/RSV testing.  Fact Sheet for Patients: EntrepreneurPulse.com.au  Fact Sheet for Healthcare Providers: IncredibleEmployment.be  This test is not yet approved or cleared by the [place] FDA and has been authorized for detection and/or diagnosis of SARS-CoV-2 by FDA under an Emergency Use Authorization (EUA). This EUA will remain in effect (meaning this test can be used) for the duration of the COVID-19 declaration under Section 564(b)(1) of the Act, 21 U.S.C. section 360bbb-3(b)(1), unless the authorization is terminated or revoked.  Performed at [facility], [place] [address] [place], [place] [address]   Urine Culture     Status:  Abnormal   Collection Time: 10/02/21  6:13 PM   Specimen: Urine, Clean Catch  Result Value Ref Range Status   Specimen Description URINE, CLEAN CATCH  Final   Special Requests   Final    NONE Performed at [facility], [place] [address] [place], [place] [address]    Culture MULTIPLE SPECIES PRESENT, SUGGEST RECOLLECTION (A)  Final   Report Status 10/03/2021 FINAL  Final     Labs: BNP (last 3 results) Recent Labs    11/07/20 1600 01/03/21 1626 01/12/21 1408  BNP 484.7* 225* 027.7*   Basic Metabolic Panel: Recent Labs  Lab 10/01/21 1845 10/02/21 0002 10/02/21 0505 10/03/21 0809  NA 148* 147* 150* 140  K 4.5 5.1 5.6* 4.9  CL 117*  --  109 102  CO2 22  --  26 23  GLUCOSE 87  --  93 103*  BUN 51*  --  63* 30*  CREATININE 7.28*  --  9.30* 5.97*  CALCIUM 7.8*  --  9.8 9.1   Liver Function Tests: Recent Labs  Lab 10/01/21 1845  AST 17  ALT 10  ALKPHOS 199*  BILITOT 0.6  PROT 5.1*  ALBUMIN 2.7*   No results for input(s): LIPASE, AMYLASE in the last 168 hours. Recent Labs  Lab 10/01/21 2107  AMMONIA 37*   CBC: Recent Labs  Lab 10/01/21 1845 10/02/21 0002 10/02/21 0505 10/03/21 0809  WBC 7.2  --  9.6 8.7  HGB 10.4* 11.6* 12.2* 12.6*  HCT 34.5* 34.0* 38.8* 37.8*  MCV 108.2*  --  105.7* 101.1*  PLT 121*  --  140* 119*   Cardiac Enzymes: No results for input(s): CKTOTAL, CKMB, CKMBINDEX, TROPONINI in the last 168 hours. BNP: Invalid input(s): POCBNP CBG: Recent Labs  Lab 10/04/21 2107 10/05/21 0006 10/05/21 0354 10/05/21 0638 10/05/21 0737  GLUCAP 78 158* 216* 124* 100*   D-Dimer No results for input(s): DDIMER in the last 72 hours. Hgb A1c Recent Labs    10/04/21 1853  HGBA1C 6.1*   Lipid Profile No results for input(s): CHOL, HDL, LDLCALC, TRIG, CHOLHDL, LDLDIRECT in the last 72 hours. Thyroid function studies No results for input(s): TSH, T4TOTAL, T3FREE, THYROIDAB in the last 72 hours.  Invalid input(s): FREET3 Anemia work  up No results for input(s): VITAMINB12, FOLATE, FERRITIN, TIBC, IRON, RETICCTPCT in the last 72 hours. Urinalysis    Component Value Date/Time   COLORURINE YELLOW 10/02/2021 1813   APPEARANCEUR CLOUDY (A) 10/02/2021 1813   LABSPEC 1.015 10/02/2021 1813   PHURINE 8.5 (H) 10/02/2021 1813   GLUCOSEU NEGATIVE 10/02/2021 1813   HGBUR LARGE (A) 10/02/2021 1813   BILIRUBINUR NEGATIVE 10/02/2021 1813   BILIRUBINUR neg 02/14/2015 1115   KETONESUR NEGATIVE 10/02/2021 1813   PROTEINUR 100 (A) 10/02/2021 1813   UROBILINOGEN 0.2 02/20/2015 0944   NITRITE NEGATIVE 10/02/2021 1813   LEUKOCYTESUR MODERATE (A) 10/02/2021 1813   Sepsis Labs Invalid input(s): PROCALCITONIN,  WBC,  LACTICIDVEN Microbiology Recent Results (from the past 240 hour(s))  Resp Panel by RT-PCR (Flu A&B, Covid) Nasopharyngeal Swab     Status: None   Collection Time: 10/01/21  6:45 PM   Specimen: Nasopharyngeal Swab; Nasopharyngeal(NP) swabs in vial transport medium  Result Value Ref Range Status   SARS Coronavirus 2 by RT PCR NEGATIVE NEGATIVE Final    Comment: (NOTE) SARS-CoV-2 target nucleic acids are NOT DETECTED.  The SARS-CoV-2 RNA is generally detectable in upper respiratory specimens during the acute phase of infection. The lowest concentration of SARS-CoV-2 viral copies this assay can detect is 138 copies/mL. A negative result does not preclude SARS-Cov-2 infection and should not be used as the sole basis for treatment or other patient management decisions. A negative result may occur with  improper specimen collection/handling, submission of specimen other than nasopharyngeal swab, presence of viral mutation(s) within the areas targeted by this assay, and inadequate number of viral copies(<138 copies/mL). A negative result must be combined with clinical observations, patient history, and epidemiological information. The expected result is Negative.  Fact Sheet for Patients:   EntrepreneurPulse.com.au  Fact Sheet for Healthcare Providers:  IncredibleEmployment.be  This test is no t yet approved or cleared by the [place] FDA and  has been authorized for detection and/or diagnosis of SARS-CoV-2 by FDA under an Emergency Use Authorization (EUA). This EUA will remain  in effect (meaning this test can be used) for the duration of  the COVID-19 declaration under Section 564(b)(1) of the Act, 21 U.S.C.section 360bbb-3(b)(1), unless the authorization is terminated  or revoked sooner.       Influenza A by PCR NEGATIVE NEGATIVE Final   Influenza B by PCR NEGATIVE NEGATIVE Final    Comment: (NOTE) The Xpert Xpress SARS-CoV-2/FLU/RSV plus assay is intended as an aid in the diagnosis of influenza from Nasopharyngeal swab specimens and should not be used as a sole basis for treatment. Nasal washings and aspirates are unacceptable for Xpert Xpress SARS-CoV-2/FLU/RSV testing.  Fact Sheet for Patients: EntrepreneurPulse.com.au  Fact Sheet for Healthcare Providers: IncredibleEmployment.be  This test is not yet approved or cleared by the [place] FDA and has been authorized for detection and/or diagnosis of SARS-CoV-2 by FDA under an Emergency Use Authorization (EUA). This EUA will remain in effect (meaning this test can be used) for the duration of the COVID-19 declaration under Section 564(b)(1) of the Act, 21 U.S.C. section 360bbb-3(b)(1), unless the authorization is terminated or revoked.  Performed at [facility], [place] [address] [place], [place] [address]   Urine Culture     Status: Abnormal   Collection Time: 10/02/21  6:13 PM   Specimen: Urine, Clean Catch  Result Value Ref Range Status   Specimen Description URINE, CLEAN CATCH  Final   Special Requests   Final    NONE Performed at [facility], [place] [address] [place],  [address]    Culture MULTIPLE  SPECIES PRESENT, SUGGEST RECOLLECTION (A)  Final   Report Status 10/03/2021 FINAL  Final     Time coordinating discharge: Over 30 minutes  SIGNED:   Phillips Climes, MD  [facility] 10/05/2021, 10:52 AM Pager   If 7PM-7AM, please contact night-coverage [email or web address] Password [email or web address]

## 2021-10-05 NOTE — Progress Notes (Signed)
D/C order noted. Contacted [facility] to advise clinic of pt's d/c today and pt to resume care tomorrow.   Melven Sartorius Renal Navigator [phone]

## 2021-10-05 NOTE — Progress Notes (Signed)
Occupational Therapy Treatment Patient Details Name: [name] MRN: [id number] DOB: April 25, 1941 Today's Date: 10/05/2021   History of present illness 81 y.o. male presenting to ED 1/18 with LLQ abdominal and suprapubic pain x1 week and AMS on top of basline dementia. Patient admitted with hypernatremia and acute encephalopathy secondary to dehydration vs medication induced. Further work-up (-) for acute findings. PMHx significant for dementia, fall w/ R hip fx s/p IM nailing 10/2020, Hx of UTIs, ESRD on HD MWF, PVD, HTN and DVT.   OT comments  OT treatment session with focus on self-care re-education, household mobility and activity tolerance. Patient declined ADLs but in agreement with functional mobility in hallway walking over 150+ with RW and Min guard to close supervision A. Cues for proximity to RW. Patient reporting discomfort in BLE. Session concluded with patient seated in recliner with call bell within reach, belt alarm activated and all needs met. Plan for d/c home with spouse later today.    Recommendations for follow up therapy are one component of a multi-disciplinary discharge planning process, led by the attending physician.  Recommendations may be updated based on patient status, additional functional criteria and insurance authorization.    Follow Up Recommendations  Home health OT    Assistance Recommended at Discharge Frequent or constant Supervision/Assistance  Patient can return home with the following      Equipment Recommendations  None recommended by OT    Recommendations for Other Services      Precautions / Restrictions Precautions Precautions: Fall Restrictions Weight Bearing Restrictions: No       Mobility Bed Mobility Overal bed mobility: Needs Assistance Bed Mobility: Supine to Sit     Supine to sit: Supervision;HOB elevated     General bed mobility comments: Supervision A for safety.    Transfers Overall transfer level: Needs  assistance Equipment used: Rolling walker (2 wheels) Transfers: Sit to/from Stand Sit to Stand: Min guard           General transfer comment: Min guard for sit to stand from EOB positioned in lowest setting. Cues for hand placement.     Balance Overall balance assessment: Needs assistance Sitting-balance support: No upper extremity supported;Feet supported Sitting balance-Leahy Scale: Fair     Standing balance support: Bilateral upper extremity supported;During functional activity;Reliant on assistive device for balance Standing balance-Leahy Scale: Poor Standing balance comment: Reliant on at least unilateral UE support to maintain static standing balance. Reliant on BUE on RW with mobility.                           ADL either performed or assessed with clinical judgement   ADL Overall ADL's : Needs assistance/impaired                         Toilet Transfer: Min guard;Rolling walker (2 wheels) Toilet Transfer Details (indicate cue type and reason): Simualted with transfer to recliner. Cues for hand placement.                Extremity/Trunk Assessment              Vision       Perception     Praxis      Cognition Arousal/Alertness: Awake/alert Behavior During Therapy: Flat affect Overall Cognitive Status: History of cognitive impairments - at baseline  General Comments: Baseline dementia; folllows 1-step verbal commands with increased time. Pleasantly confused. Some perseveration noted.          Exercises     Shoulder Instructions       General Comments VSS on RA.    Pertinent Vitals/ Pain       Pain Assessment: Faces Faces Pain Scale: Hurts little more Pain Location: BLE with mobility Pain Descriptors / Indicators: Aching;Sore Pain Intervention(s): Limited activity within patient's tolerance;Monitored during session;Repositioned  Home Living                                           Prior Functioning/Environment              Frequency  Min 2X/week        Progress Toward Goals  OT Goals(current goals can now be found in the care plan section)  Progress towards OT goals: Progressing toward goals  Acute Rehab OT Goals Patient Stated Goal: For patient to return home (per wife) OT Goal Formulation: With family Time For Goal Achievement: 10/17/21 Potential to Achieve Goals: Good ADL Goals Pt Will Perform Grooming: with supervision;standing Pt Will Perform Upper Body Dressing: with supervision;sitting Pt Will Perform Lower Body Dressing: with min assist;sit to/from stand Pt Will Transfer to Toilet: with supervision;ambulating Pt Will Perform Toileting - Clothing Manipulation and hygiene: sit to/from stand;with min assist  Plan Discharge plan remains appropriate;Frequency remains appropriate    Co-evaluation                 AM-PAC OT "6 Clicks" Daily Activity     Outcome Measure   Help from another person eating meals?: A Little Help from another person taking care of personal grooming?: A Little Help from another person toileting, which includes using toliet, bedpan, or urinal?: A Lot Help from another person bathing (including washing, rinsing, drying)?: A Lot Help from another person to put on and taking off regular upper body clothing?: A Little Help from another person to put on and taking off regular lower body clothing?: A Lot 6 Click Score: 15    End of Session Equipment Utilized During Treatment: Rolling walker (2 wheels)  OT Visit Diagnosis: Unsteadiness on feet (R26.81);Muscle weakness (generalized) (M62.81);History of falling (Z91.81);Other symptoms and signs involving cognitive function;Pain Pain - Right/Left:  (Bilateral) Pain - part of body: Leg   Activity Tolerance Patient tolerated treatment well   Patient Left in chair;with call bell/phone within reach [facility] alarm activated.)   Nurse  Communication Mobility status        Time: 1610-9604 OT Time Calculation (min): 22 min  Charges: OT General Charges $OT Visit: 1 Visit OT Treatments $Therapeutic Activity: 8-22 mins  Chaya Dehaan H. OTR/L Supplemental OT, Department of rehab services [phone]  Tasfia Vasseur R H. 10/05/2021, 11:13 AM

## 2021-10-05 NOTE — Telephone Encounter (Signed)
[place] from [facility] called to say that they would be going out to see [name] for PT, OT and SW, that [name] no longer wants [employer].

## 2021-10-05 NOTE — Discharge Instructions (Signed)
Follow with Primary MD Elby Showers, MD in 7 days   Get CBC, CMP,  checked  by Primary MD next visit.    Activity: As tolerated with Full fall precautions use walker/cane & assistance as needed   Disposition Home    Diet: Renal/carb modified with 1200 cc fluid restrictions.   On your next visit with your primary care physician please Get Medicines reviewed and adjusted.   Please request your Prim.MD to go over all Hospital Tests and Procedure/Radiological results at the follow up, please get all Hospital records sent to your Prim MD by signing hospital release before you go home.   If you experience worsening of your admission symptoms, develop shortness of breath, life threatening emergency, suicidal or homicidal thoughts you must seek medical attention immediately by calling 911 or calling your MD immediately  if symptoms less severe.  You Must read complete instructions/literature along with all the possible adverse reactions/side effects for all the Medicines you take and that have been prescribed to you. Take any new Medicines after you have completely understood and accpet all the possible adverse reactions/side effects.   Do not drive when taking Pain medications.    Do not take more than prescribed Pain, Sleep and Anxiety Medications  Special Instructions: If you have smoked or chewed Tobacco  in the last 2 yrs please stop smoking, stop any regular Alcohol  and or any Recreational drug use.     Please note  You were cared for by a hospitalist during your hospital stay. If you have any questions about your discharge medications or the care you received while you were in the hospital after you are discharged, you can call the unit and asked to speak with the hospitalist on call if the hospitalist that took care of you is not available. Once you are discharged, your primary care physician will handle any further medical issues. Please note that NO REFILLS for any discharge  medications will be authorized once you are discharged, as it is imperative that you return to your primary care physician (or establish a relationship with a primary care physician if you do not have one) for your aftercare needs so that they can reassess your need for medications and monitor your lab values.

## 2021-10-05 NOTE — Progress Notes (Signed)
To cat scan via stretcher. Pt agrees to have test now and is in good spirits and cooperative but remains confused to place ,time and situation

## 2021-10-05 NOTE — Progress Notes (Signed)
PATIENT ATE HIS DINNER AND NOW IS CHEERFULLY CONFUSED AND COOPERATIVE WITH STAFF. AWAKE WATCHING TV AND EATING. NO OTHER COMPLAINTS .

## 2021-10-05 NOTE — TOC Transition Note (Signed)
Transition of Care [facility]) - CM/SW Discharge Note   Patient Details  Name: [name] MRN: [id number] Date of Birth: April 09, 1941  [facility]) CM/SW Contact:  Cyndi Bender, RN Phone Number: 10/05/2021, 12:01 PM   Clinical Narrative:    Patient stable for discharge.  HH-PT/OT/SW arranged with Meredeth Ide with [facility] notified of discharge. Wife can transport home      Barriers to Discharge: Continued Medical Work up   Patient Goals and CMS Choice   CMS Medicare.gov Compare Post Acute Care list provided to:: Patient Represenative (must comment) Choice offered to / list presented to : Spouse  Discharge Placement               Home with [facility]        Discharge Plan and Services   Discharge Planning Services: CM Consult Post Acute Care Choice: Home Health                    HH Arranged: PT, OT, Social Work [facility] Agency: [place] Date [place]: 10/04/21 Time HH Agency Contacted: 34 Representative spoke with at [place]: [place] ([place]) Interventions     Readmission Risk Interventions Readmission Risk Prevention Plan 10/05/2021  Transportation Screening Complete  Medication Review [employer]) Complete  PCP or Specialist appointment within 3-5 days of discharge Complete  HRI or [place] Complete  SW Recovery Care/Counseling Consult Complete  Palliative Care Screening Complete  [place] Not Applicable  Some recent data might be hidden

## 2021-10-05 NOTE — Progress Notes (Signed)
Nsg Discharge Note  Admit Date:  10/01/2021 Discharge date: 10/05/2021   [name] to be D/C'd Home per MD order.  AVS completed.     Discharge Medication: Allergies as of 10/05/2021       Reactions   Ambien [zolpidem Tartrate] Other (See Comments)   Hallucinations and "felt crazy"    Penicillins Rash, Hives   Has patient had a PCN reaction causing immediate rash, facial/tongue/throat swelling, SOB or lightheadedness with hypotension: Yes Has patient had a PCN reaction causing severe rash involving mucus membranes or skin necrosis: Yes Has patient had a PCN reaction that required hospitalization: No Has patient had a PCN reaction occurring within the last 10 years: No If all of the above answers are "NO", then may proceed with Cephalosporin use. Other reaction(s): HIVES        Medication List     TAKE these medications    acetaminophen 325 MG tablet Commonly known as: TYLENOL Take 1-2 tablets (325-650 mg total) by mouth every 4 (four) hours as needed for mild pain.   albuterol 108 (90 Base) MCG/ACT inhaler Commonly known as: VENTOLIN HFA Inhale 1 puff into the lungs daily as needed for wheezing or shortness of breath.   allopurinol 100 MG tablet Commonly known as: ZYLOPRIM Take 100 mg by mouth daily.   camphor-menthol lotion Commonly known as: SARNA Apply 1 application topically 4 (four) times daily as needed for itching.   cetirizine 10 MG tablet Commonly known as: ZYRTEC Take 10 mg by mouth daily as needed for allergies.   clobetasol 0.05 % external solution Commonly known as: TEMOVATE Apply 1 application topically 2 (two) times daily as needed (scalp irritation.).   donepezil 10 MG tablet Commonly known as: ARICEPT Take 1 tablet (10 mg total) by mouth at bedtime.   feeding supplement (NEPRO CARB STEADY) Liqd Take 237 mLs by mouth daily.   Fluocinolone Acetonide Scalp 0.01 % Oil Apply 1 application topically daily as needed (scalp irritation.).    fluticasone 50 MCG/ACT nasal spray Commonly known as: FLONASE Place 1 spray into both nostrils daily. What changed:  when to take this reasons to take this   Forteo 600 MCG/2.4ML Sopn Generic drug: Teriparatide (Recombinant) Inject 20 mcg subcutaneously daily What changed:  how much to take how to take this when to take this additional instructions   FreeStyle Freedom Lite w/Device Kit Use to check blood sugar 2 times per day dx code E11.65   freestyle lancets Use as instructed to check blood sugar 2 times per day dx code E11.65   FREESTYLE LITE test strip Generic drug: glucose blood USE AS DIRECTED THREE TIMES DAILY What changed: additional instructions   hydrocortisone 2.5 % cream Apply 1 application topically 3 (three) times daily as needed (skin irritation (legs & arms)).   ketoconazole 2 % shampoo Commonly known as: Nizoral Apply 1 application topically 2 (two) times a week.   levothyroxine 50 MCG tablet Commonly known as: SYNTHROID Take 1 tablet (50 mcg total) by mouth daily. What changed: when to take this   lidocaine 5 % Commonly known as: LIDODERM Place 1 patch onto the skin daily as needed (back pain).   memantine 10 MG tablet Commonly known as: NAMENDA Take 1 tablet (10 mg total) by mouth 2 (two) times daily.   midodrine 10 MG tablet Commonly known as: PROAMATINE Take one pill prior to hemodialysis on MWF What changed:  how much to take how to take this when to take this additional  instructions   omeprazole 40 MG capsule Commonly known as: PRILOSEC Take 1 capsule (40 mg total) by mouth daily.   Pen Needles 32G X 4 MM Misc 1 each by Does not apply route 3 (three) times daily. USE PEN NEEDLES TO INJECT INSULIN THREE TIMES DAILY.   sevelamer carbonate 800 MG tablet Commonly known as: RENVELA Take 1,600-2,400 mg by mouth See admin instructions. Take 2,400 mg by mouth three times a day with meals and 1,600 mg with snacks on Sun/Tues/Thurs/Sat  and 2,400 mg with breakfast and dinner and nothing with snacks on Mon/Wed/Fri   simvastatin 20 MG tablet Commonly known as: ZOCOR Take 20 mg by mouth at bedtime.   Theratears 0.25 % Soln Generic drug: Carboxymethylcellulose Sodium Place 1 drop into both eyes as needed (for dryness).   Tradjenta 5 MG Tabs tablet Generic drug: linagliptin TAKE 1 TABLET DAILY What changed: how much to take        Discharge Assessment: Vitals:   10/05/21 0736 10/05/21 1209  BP: (!) 122/53 (!) 139/49  Pulse: 64 66  Resp: 17 16  Temp: 97.7 F (36.5 C) (!) 97.5 F (36.4 C)  SpO2: 96% 98%   Skin clean, dry and intact without evidence of skin break down, no evidence of skin tears noted. IV catheter discontinued intact. Site without signs and symptoms of complications - no redness or edema noted at insertion site, patient denies c/o pain - only slight tenderness at site.  Dressing with slight pressure applied.  D/c Instructions-Education: Discharge instructions given to patient/family with verbalized understanding. D/c education completed with patient/family including follow up instructions, medication list, d/c activities limitations if indicated, with other d/c instructions as indicated by MD - patient able to verbalize understanding, all questions fully answered. Patient instructed to return to ED, call 911, or call MD for any changes in condition.  Patient escorted via [place], and D/C home via private auto.  Hiram Comber, RN 10/05/2021 3:43 PM

## 2021-10-06 ENCOUNTER — Telehealth: Payer: Self-pay

## 2021-10-06 DIAGNOSIS — G9340 Encephalopathy, unspecified: Secondary | ICD-10-CM | POA: Diagnosis not present

## 2021-10-06 DIAGNOSIS — N2581 Secondary hyperparathyroidism of renal origin: Secondary | ICD-10-CM | POA: Diagnosis not present

## 2021-10-06 DIAGNOSIS — E119 Type 2 diabetes mellitus without complications: Secondary | ICD-10-CM | POA: Diagnosis not present

## 2021-10-06 DIAGNOSIS — L299 Pruritus, unspecified: Secondary | ICD-10-CM | POA: Diagnosis not present

## 2021-10-06 DIAGNOSIS — E870 Hyperosmolality and hypernatremia: Secondary | ICD-10-CM | POA: Diagnosis not present

## 2021-10-06 DIAGNOSIS — N186 End stage renal disease: Secondary | ICD-10-CM | POA: Diagnosis not present

## 2021-10-06 DIAGNOSIS — Z992 Dependence on renal dialysis: Secondary | ICD-10-CM | POA: Diagnosis not present

## 2021-10-06 NOTE — Telephone Encounter (Signed)
Transition Care Management Follow-up Telephone Call Date of discharge and from where: 10/05/2021 How have you been since you were released from the hospital? Doing better. Was able to get up and go to dialysis today.  Any questions or concerns? No  Items Reviewed: Did the pt receive and understand the discharge instructions provided? Yes  Medications obtained and verified? Yes  Other?  N/a Any new allergies since your discharge? No  Dietary orders reviewed? Yes Do you have support at home? Yes   Home Care and Equipment/Supplies: Were home health services ordered? yes If so, what is the name of the agency? [facility]  Has the agency set up a time to come to the patient's home? no Were any new equipment or medical supplies ordered?  No What is the name of the medical supply agency? N/a Were you able to get the supplies/equipment? not applicable Do you have any questions related to the use of the equipment or supplies? No  Functional Questionnaire: (I = Independent and D = Dependent) ADLs: d  Bathing/Dressing- d  Meal Prep- d  Eating- d  Maintaining continence- d  Transferring/Ambulation- d  Managing Meds- d  Follow up appointments reviewed:  PCP Hospital f/u appt confirmed? Yes  Scheduled to see Dr Renold Genta on 10/10/2021 @ 12. [facility] f/u appt confirmed? No  Are transportation arrangements needed? No  If their condition worsens, is the pt aware to call PCP or go to the Emergency Dept.? Yes Was the patient provided with contact information for the PCP's office or ED? Yes Was to pt encouraged to call back with questions or concerns? Yes

## 2021-10-08 DIAGNOSIS — G9341 Metabolic encephalopathy: Secondary | ICD-10-CM | POA: Diagnosis not present

## 2021-10-08 DIAGNOSIS — Z7983 Long term (current) use of bisphosphonates: Secondary | ICD-10-CM | POA: Diagnosis not present

## 2021-10-08 DIAGNOSIS — Z86718 Personal history of other venous thrombosis and embolism: Secondary | ICD-10-CM | POA: Diagnosis not present

## 2021-10-08 DIAGNOSIS — E870 Hyperosmolality and hypernatremia: Secondary | ICD-10-CM | POA: Diagnosis not present

## 2021-10-08 DIAGNOSIS — I120 Hypertensive chronic kidney disease with stage 5 chronic kidney disease or end stage renal disease: Secondary | ICD-10-CM | POA: Diagnosis not present

## 2021-10-08 DIAGNOSIS — Z992 Dependence on renal dialysis: Secondary | ICD-10-CM | POA: Diagnosis not present

## 2021-10-08 DIAGNOSIS — R911 Solitary pulmonary nodule: Secondary | ICD-10-CM | POA: Diagnosis not present

## 2021-10-08 DIAGNOSIS — G309 Alzheimer's disease, unspecified: Secondary | ICD-10-CM | POA: Diagnosis not present

## 2021-10-08 DIAGNOSIS — E1122 Type 2 diabetes mellitus with diabetic chronic kidney disease: Secondary | ICD-10-CM | POA: Diagnosis not present

## 2021-10-08 DIAGNOSIS — N186 End stage renal disease: Secondary | ICD-10-CM | POA: Diagnosis not present

## 2021-10-08 DIAGNOSIS — Z7984 Long term (current) use of oral hypoglycemic drugs: Secondary | ICD-10-CM | POA: Diagnosis not present

## 2021-10-08 DIAGNOSIS — E1151 Type 2 diabetes mellitus with diabetic peripheral angiopathy without gangrene: Secondary | ICD-10-CM | POA: Diagnosis not present

## 2021-10-08 DIAGNOSIS — F0280 Dementia in other diseases classified elsewhere without behavioral disturbance: Secondary | ICD-10-CM | POA: Diagnosis not present

## 2021-10-08 DIAGNOSIS — Z9181 History of falling: Secondary | ICD-10-CM | POA: Diagnosis not present

## 2021-10-08 DIAGNOSIS — Z87440 Personal history of urinary (tract) infections: Secondary | ICD-10-CM | POA: Diagnosis not present

## 2021-10-09 ENCOUNTER — Encounter (HOSPITAL_COMMUNITY): Payer: Self-pay

## 2021-10-09 DIAGNOSIS — E119 Type 2 diabetes mellitus without complications: Secondary | ICD-10-CM | POA: Diagnosis not present

## 2021-10-09 DIAGNOSIS — L299 Pruritus, unspecified: Secondary | ICD-10-CM | POA: Diagnosis not present

## 2021-10-09 DIAGNOSIS — N2581 Secondary hyperparathyroidism of renal origin: Secondary | ICD-10-CM | POA: Diagnosis not present

## 2021-10-09 DIAGNOSIS — N186 End stage renal disease: Secondary | ICD-10-CM | POA: Diagnosis not present

## 2021-10-09 DIAGNOSIS — Z992 Dependence on renal dialysis: Secondary | ICD-10-CM | POA: Diagnosis not present

## 2021-10-10 ENCOUNTER — Telehealth (INDEPENDENT_AMBULATORY_CARE_PROVIDER_SITE_OTHER): Payer: Medicare Other | Admitting: Internal Medicine

## 2021-10-10 ENCOUNTER — Encounter: Payer: Self-pay | Admitting: Internal Medicine

## 2021-10-10 ENCOUNTER — Telehealth: Payer: Self-pay

## 2021-10-10 VITALS — BP 140/59 | HR 68 | Resp 16

## 2021-10-10 DIAGNOSIS — E869 Volume depletion, unspecified: Secondary | ICD-10-CM

## 2021-10-10 DIAGNOSIS — L298 Other pruritus: Secondary | ICD-10-CM | POA: Diagnosis not present

## 2021-10-10 DIAGNOSIS — R410 Disorientation, unspecified: Secondary | ICD-10-CM | POA: Diagnosis not present

## 2021-10-10 DIAGNOSIS — N186 End stage renal disease: Secondary | ICD-10-CM | POA: Diagnosis not present

## 2021-10-10 DIAGNOSIS — R413 Other amnesia: Secondary | ICD-10-CM

## 2021-10-10 DIAGNOSIS — E870 Hyperosmolality and hypernatremia: Secondary | ICD-10-CM

## 2021-10-10 DIAGNOSIS — G9341 Metabolic encephalopathy: Secondary | ICD-10-CM | POA: Diagnosis not present

## 2021-10-10 DIAGNOSIS — E538 Deficiency of other specified B group vitamins: Secondary | ICD-10-CM

## 2021-10-10 DIAGNOSIS — F0280 Dementia in other diseases classified elsewhere without behavioral disturbance: Secondary | ICD-10-CM | POA: Diagnosis not present

## 2021-10-10 DIAGNOSIS — I120 Hypertensive chronic kidney disease with stage 5 chronic kidney disease or end stage renal disease: Secondary | ICD-10-CM | POA: Diagnosis not present

## 2021-10-10 DIAGNOSIS — E1122 Type 2 diabetes mellitus with diabetic chronic kidney disease: Secondary | ICD-10-CM | POA: Diagnosis not present

## 2021-10-10 DIAGNOSIS — G309 Alzheimer's disease, unspecified: Secondary | ICD-10-CM | POA: Diagnosis not present

## 2021-10-10 DIAGNOSIS — L2989 Other pruritus: Secondary | ICD-10-CM

## 2021-10-10 NOTE — Telephone Encounter (Signed)
Eliezer Lofts with [facility] called asking for verbal orders to see the patient with OT and PT. 3 times a week for 1 week, 2 times a week for 2 weeks, 1 time a week for 4 weeks. This is to work on gait, balance, strength and transfers. [phone]

## 2021-10-10 NOTE — Progress Notes (Signed)
Subjective:    Patient ID: [name], male    DOB: [birth year], 81 y.o.   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  He is identified using 2 identifiers as [name]. [name], a patient in this practice.  His wife is present with him today.  He and his wife are at their home and I am at my office.  They are agreeable to visit in this format today.  He will be home PT due to issues ambulating.  He has a complicated history.  History of kyphoplasty L3 and L5 in the remote past secondary to a fall.  He had endplate disruption of L1 and L2 without frank compression in April 2019 due to a fall.  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  History of gout, hyperlipidemia, hypothyroidism and allergic rhinitis.  History of peripheral vascular disease.  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  He had bilateral bur holes placed by Dr. Ellene Route and went to rehab for period of time.  He also had fractured ribs.  All of this was related to a fall.  He recovered.  History of GI bleeding in early October 2019.  History of Herpes zoster.  History of small bowel obstruction treated with surgical lysis of adhesions in 1996.  Has had bouts of diverticulitis responding to Cipro and Flagyl.  History of right intertrochanteric femoral fracture February 2022.  He went to rehab facility before going back home.  Social history: He is married.  1 adopted daughter.  He is retired.  Non-smoker.  Wife works for the [employer].  Wife  called me on Sunday, January 8 saying her husband was confused.  He had not fallen.  She was not sure why he was confused but was concerned that he might have an infection or GI bleeding.  We discussed whether or not this could wait until the following morning or whether she needed to call an ambulance that evening.  The weather was extremely bad and raining.  We decided to perhaps wait unless symptoms worsened. However, He was admitted that evening. He is anuric but apparently had some suprapubic pain.Sodium was 148 and Chloride was 117. Creatinine was 7.28 and calcium was 7.28. ammonia was 37.H had some pyuria.Urine culture grew multiple species. CT of abdomen and pelvis were normal.Patient was seen by Nephrology and dialyzed on January 11th. Was treated with PPI. Patient complained of abdominal pain with dialysis so another abdomen and pelvic CT with contrast obtained which was negative. Has small lingular lung nodule and CT without contrast is to be performed in 12 months. Hypernatremia thought to be due to poor po intake. Juanell Fairly, R.N. saw him for [facility] on January 12. Noted patient recently started on Korsuva. This was discontinued.Discharged Home January 12th. Is to have labs drawn at dialysis. BMP/CBC in one week. TSH and free T4 to be checked in 4-6 weeks. TSH was low but free T4 was normal.Currently on Levothyroxine 50 mcgs daily.Is to continue monthly B12 injections at home. Wife has been shown how to do this  previously at my office.   Review of Systems see above     Objective:   Physical Exam  He is seen virtually.  He recognizes me.  No dysphasia noted.  He is alert.  He is seen sitting down at his home.  Wife is present.      Assessment & Plan:  Confusional episode-thought to be related to metabolic issues with hypernatremia and volume depletion versus medication reaction with [name] which is a drug used for uremic pruritus.  This has been discontinued.  Is to have CBC and  B-met at dialysis 1 week from discharge  End-stage renal disease treated with dialysis  Type 2 diabetes mellitus  Hypothyroidism  Possible UTI causing confusion  History of GI bleeding  B12 deficiency  Plan: Patient is receiving home health services through [place].  He will need follow-up lab studies as requested by hospitalist and perhaps this can be done at dialysis in about a week.  He is also supposed to have thyroid functions checked in about 4 to 6 weeks.  He will continue to receive Palliative Care services.

## 2021-10-10 NOTE — Telephone Encounter (Signed)
Verbal order given  

## 2021-10-10 NOTE — Patient Instructions (Addendum)
Patient receiving home health services and Palliative care services.  Needs to have thyroid functions checked in 4 to 6 weeks as requested by hospitalist and CBC and basic metabolic panel 1 week from discharge.  [name] Has been discontinued.  Continue monthly B12 injections.  Continue dialysis per Nephrology.  He has appointment with Dr. Dwyane Dee in February.  He is due for Medicare wellness visit and annual health maintenance exam here in August.

## 2021-10-11 DIAGNOSIS — N186 End stage renal disease: Secondary | ICD-10-CM | POA: Diagnosis not present

## 2021-10-11 DIAGNOSIS — E1129 Type 2 diabetes mellitus with other diabetic kidney complication: Secondary | ICD-10-CM | POA: Diagnosis not present

## 2021-10-11 DIAGNOSIS — Z992 Dependence on renal dialysis: Secondary | ICD-10-CM | POA: Diagnosis not present

## 2021-10-11 DIAGNOSIS — N2581 Secondary hyperparathyroidism of renal origin: Secondary | ICD-10-CM | POA: Diagnosis not present

## 2021-10-11 DIAGNOSIS — L299 Pruritus, unspecified: Secondary | ICD-10-CM | POA: Diagnosis not present

## 2021-10-11 DIAGNOSIS — E119 Type 2 diabetes mellitus without complications: Secondary | ICD-10-CM | POA: Diagnosis not present

## 2021-10-12 DIAGNOSIS — G9341 Metabolic encephalopathy: Secondary | ICD-10-CM | POA: Diagnosis not present

## 2021-10-12 DIAGNOSIS — I120 Hypertensive chronic kidney disease with stage 5 chronic kidney disease or end stage renal disease: Secondary | ICD-10-CM | POA: Diagnosis not present

## 2021-10-12 DIAGNOSIS — E1122 Type 2 diabetes mellitus with diabetic chronic kidney disease: Secondary | ICD-10-CM | POA: Diagnosis not present

## 2021-10-12 DIAGNOSIS — F0280 Dementia in other diseases classified elsewhere without behavioral disturbance: Secondary | ICD-10-CM | POA: Diagnosis not present

## 2021-10-12 DIAGNOSIS — G309 Alzheimer's disease, unspecified: Secondary | ICD-10-CM | POA: Diagnosis not present

## 2021-10-12 DIAGNOSIS — N186 End stage renal disease: Secondary | ICD-10-CM | POA: Diagnosis not present

## 2021-10-13 DIAGNOSIS — N2581 Secondary hyperparathyroidism of renal origin: Secondary | ICD-10-CM | POA: Diagnosis not present

## 2021-10-13 DIAGNOSIS — N186 End stage renal disease: Secondary | ICD-10-CM | POA: Diagnosis not present

## 2021-10-13 DIAGNOSIS — E119 Type 2 diabetes mellitus without complications: Secondary | ICD-10-CM | POA: Diagnosis not present

## 2021-10-13 DIAGNOSIS — L299 Pruritus, unspecified: Secondary | ICD-10-CM | POA: Diagnosis not present

## 2021-10-13 DIAGNOSIS — Z992 Dependence on renal dialysis: Secondary | ICD-10-CM | POA: Diagnosis not present

## 2021-10-16 DIAGNOSIS — E119 Type 2 diabetes mellitus without complications: Secondary | ICD-10-CM | POA: Diagnosis not present

## 2021-10-16 DIAGNOSIS — Z992 Dependence on renal dialysis: Secondary | ICD-10-CM | POA: Diagnosis not present

## 2021-10-16 DIAGNOSIS — N2581 Secondary hyperparathyroidism of renal origin: Secondary | ICD-10-CM | POA: Diagnosis not present

## 2021-10-16 DIAGNOSIS — L299 Pruritus, unspecified: Secondary | ICD-10-CM | POA: Diagnosis not present

## 2021-10-16 DIAGNOSIS — N186 End stage renal disease: Secondary | ICD-10-CM | POA: Diagnosis not present

## 2021-10-17 DIAGNOSIS — E869 Volume depletion, unspecified: Secondary | ICD-10-CM | POA: Diagnosis not present

## 2021-10-17 DIAGNOSIS — G9341 Metabolic encephalopathy: Secondary | ICD-10-CM | POA: Diagnosis not present

## 2021-10-17 DIAGNOSIS — N186 End stage renal disease: Secondary | ICD-10-CM | POA: Diagnosis not present

## 2021-10-17 DIAGNOSIS — F0280 Dementia in other diseases classified elsewhere without behavioral disturbance: Secondary | ICD-10-CM | POA: Diagnosis not present

## 2021-10-17 DIAGNOSIS — I120 Hypertensive chronic kidney disease with stage 5 chronic kidney disease or end stage renal disease: Secondary | ICD-10-CM | POA: Diagnosis not present

## 2021-10-17 DIAGNOSIS — G309 Alzheimer's disease, unspecified: Secondary | ICD-10-CM | POA: Diagnosis not present

## 2021-10-17 DIAGNOSIS — R410 Disorientation, unspecified: Secondary | ICD-10-CM | POA: Diagnosis not present

## 2021-10-17 DIAGNOSIS — E1122 Type 2 diabetes mellitus with diabetic chronic kidney disease: Secondary | ICD-10-CM | POA: Diagnosis not present

## 2021-10-17 NOTE — Telephone Encounter (Signed)
Faxed signed Home Health Certification orders to [facility] [phone], phone [phone]  Order  #[id number]  Certification 6/59/9357 to 12/06/2021

## 2021-10-18 DIAGNOSIS — N186 End stage renal disease: Secondary | ICD-10-CM | POA: Diagnosis not present

## 2021-10-18 DIAGNOSIS — N2581 Secondary hyperparathyroidism of renal origin: Secondary | ICD-10-CM | POA: Diagnosis not present

## 2021-10-18 DIAGNOSIS — E119 Type 2 diabetes mellitus without complications: Secondary | ICD-10-CM | POA: Diagnosis not present

## 2021-10-18 DIAGNOSIS — Z992 Dependence on renal dialysis: Secondary | ICD-10-CM | POA: Diagnosis not present

## 2021-10-18 DIAGNOSIS — L299 Pruritus, unspecified: Secondary | ICD-10-CM | POA: Diagnosis not present

## 2021-10-19 ENCOUNTER — Encounter: Payer: Self-pay | Admitting: Nurse Practitioner

## 2021-10-19 ENCOUNTER — Other Ambulatory Visit: Payer: Medicare Other | Admitting: Nurse Practitioner

## 2021-10-19 ENCOUNTER — Other Ambulatory Visit: Payer: Self-pay

## 2021-10-19 DIAGNOSIS — F0280 Dementia in other diseases classified elsewhere without behavioral disturbance: Secondary | ICD-10-CM | POA: Diagnosis not present

## 2021-10-19 DIAGNOSIS — G9341 Metabolic encephalopathy: Secondary | ICD-10-CM | POA: Diagnosis not present

## 2021-10-19 DIAGNOSIS — I120 Hypertensive chronic kidney disease with stage 5 chronic kidney disease or end stage renal disease: Secondary | ICD-10-CM | POA: Diagnosis not present

## 2021-10-19 DIAGNOSIS — E1122 Type 2 diabetes mellitus with diabetic chronic kidney disease: Secondary | ICD-10-CM | POA: Diagnosis not present

## 2021-10-19 DIAGNOSIS — N186 End stage renal disease: Secondary | ICD-10-CM | POA: Diagnosis not present

## 2021-10-19 DIAGNOSIS — Z515 Encounter for palliative care: Secondary | ICD-10-CM

## 2021-10-19 DIAGNOSIS — G309 Alzheimer's disease, unspecified: Secondary | ICD-10-CM | POA: Diagnosis not present

## 2021-10-19 DIAGNOSIS — R630 Anorexia: Secondary | ICD-10-CM

## 2021-10-19 NOTE — Progress Notes (Signed)
[facility] Palliative Care Consult Note Telephone: [phone]  Fax: [phone]    Date of encounter: 10/19/21 7:36 PM PATIENT NAME: [name] [address] [place] [address]   [phone] (home)  DOB: 07/07/1941 MRN: [id number] PRIMARY CARE PROVIDER:    Elby Showers, MD,  [address] [address] [phone]  RESPONSIBLE PARTY:    Contact Information     Name Relation Home Work Mobile   Woolverton,Shirley Spouse [phone]  [phone]   Howley,Crystal Daughter [phone]  [phone]   Summers,Tammy Niece [phone]  [phone]      I met face to face with patient and family in home. Palliative Care was asked to follow this patient by consultation request of  Baxley, Cresenciano Lick, MD to address advance care planning and complex medical decision making. This is a follow up visit.                                  ASSESSMENT AND PLAN / RECOMMENDATIONS:  Symptom Management/Plan: 1. ACP; DNR; MOST form in Vynca; Continue to treat what is treatable.    2. Anorexia; irritability; secondary to dementia; We talked about nutrition. We talked about supplements. We talked about disease progression of dementia in the setting of ESRD.   Rx: Fanny Cream; called in to [facility]. #6RF; 172m dispense 2 bottles at a time   3. Palliative care encounter; Palliative care encounter; Palliative medicine team will continue to support patient, patient's family, and medical team. Visit consisted of counseling and education dealing with the complex and emotionally intense issues of symptom management and palliative care in the setting of serious and potentially life-threatening illness  Follow up Palliative Care Visit: Palliative care will continue to follow for complex medical decision making, advance care planning, and clarification of goals. Return 8 weeks or prn.  I spent 62 minutes providing this consultation. More  than 50% of the time in this consultation was spent in counseling and care coordination. PPS: 50%  Chief Complaint: Follow up pallaitive consult for complex medical decision making  HISTORY OF PRESENT ILLNESS:  Johnny REPKAis a 81y.o. year old male  with multiple medical problems including ESRD requiring HD, Alzheimer's disease, anemia, CAD, PVD, DM, HTN, HLD, sleep apnea, hypothyroidism, gerd, gout, arthritis, h/o hiatal hernia, h/o diverticulitis, ED, seasonal allergies, cataract, h/o burr holes, AV fistula placement, breast sgy left - granulomatous mastitis, aortobifemoral bypass, angioplasty. I called [name] confirm [name] visit and covid screening negative. I visited [name] I called [name] confirm [name] visit and covid screening negative. I visited and observed [name] his home. We reviewed ros, symptoms, how [name] been feeling. Johnny TDuntonendorses he has continued to have some stomach problems, we talked about appetite, nutrition. [name] with h/o gi bleed stools have not been black. [name] they continue to monitor and have f//u MD appointment. We talked about right lower leg pain. We talked about physical therapy he has been receiving. We talked about HD, medical goals, quality of life. Johnny. [name] at length about his life. We talked about fanny cream, rx called into [name] talked about f/u visit, scheduled. Therapeutic listening, emotional support provided. Questions answered.   History obtained from review of EMR, discussion with Johnny and Johnny.  [name].  I reviewed available labs, medications, imaging, studies and related documents from the EMR.  Records reviewed and summarized above.   ROS 10 point system reviewed with Johnny and [name] all negative except HPI  Physical Exam: Constitutional: NAD General: frail appearing, thin, pleasant male EYES: lids intact ENMT: oral mucous membranes moist CV:  S1S2, RRR Pulmonary: LCTA, no increased work of breathing, no cough, room air Abdomen: normo-active BS + 4 quadrants, soft and non tender MSK: ambulatory Skin: warm and dry Neuro:  + generalized weakness,  + cognitive impairment Psych: non-anxious affect, A and O x 2 Thank you for the opportunity to participate in the care of [name].  The palliative care team will continue to follow. Please call our office at [phone] if we can be of additional assistance.   Questions and concerns were addressed. The patient/family was encouraged to call with questions and/or concerns. My contact information was provided. Provided general support and encouragement, no other unmet needs identified   This chart was dictated using voice recognition software.  Despite best efforts to proofread,  errors can occur which can change the documentation meaning.   [name] Z Johnny Greggs, NP   COVID-19 PATIENT SCREENING TOOL Asked and negative response unless otherwise noted:   Have you had symptoms of covid, tested positive or been in contact with someone with symptoms/positive test in the past 5-10 days?  No

## 2021-10-20 DIAGNOSIS — N186 End stage renal disease: Secondary | ICD-10-CM | POA: Diagnosis not present

## 2021-10-20 DIAGNOSIS — N2581 Secondary hyperparathyroidism of renal origin: Secondary | ICD-10-CM | POA: Diagnosis not present

## 2021-10-20 DIAGNOSIS — E119 Type 2 diabetes mellitus without complications: Secondary | ICD-10-CM | POA: Diagnosis not present

## 2021-10-20 DIAGNOSIS — Z992 Dependence on renal dialysis: Secondary | ICD-10-CM | POA: Diagnosis not present

## 2021-10-20 DIAGNOSIS — L299 Pruritus, unspecified: Secondary | ICD-10-CM | POA: Diagnosis not present

## 2021-10-23 DIAGNOSIS — Z992 Dependence on renal dialysis: Secondary | ICD-10-CM | POA: Diagnosis not present

## 2021-10-23 DIAGNOSIS — E119 Type 2 diabetes mellitus without complications: Secondary | ICD-10-CM | POA: Diagnosis not present

## 2021-10-23 DIAGNOSIS — L299 Pruritus, unspecified: Secondary | ICD-10-CM | POA: Diagnosis not present

## 2021-10-23 DIAGNOSIS — N186 End stage renal disease: Secondary | ICD-10-CM | POA: Diagnosis not present

## 2021-10-23 DIAGNOSIS — N2581 Secondary hyperparathyroidism of renal origin: Secondary | ICD-10-CM | POA: Diagnosis not present

## 2021-10-25 DIAGNOSIS — F0280 Dementia in other diseases classified elsewhere without behavioral disturbance: Secondary | ICD-10-CM | POA: Diagnosis not present

## 2021-10-25 DIAGNOSIS — D631 Anemia in chronic kidney disease: Secondary | ICD-10-CM | POA: Diagnosis not present

## 2021-10-25 DIAGNOSIS — A498 Other bacterial infections of unspecified site: Secondary | ICD-10-CM | POA: Diagnosis not present

## 2021-10-25 DIAGNOSIS — I120 Hypertensive chronic kidney disease with stage 5 chronic kidney disease or end stage renal disease: Secondary | ICD-10-CM | POA: Diagnosis not present

## 2021-10-25 DIAGNOSIS — G309 Alzheimer's disease, unspecified: Secondary | ICD-10-CM | POA: Diagnosis not present

## 2021-10-25 DIAGNOSIS — E1129 Type 2 diabetes mellitus with other diabetic kidney complication: Secondary | ICD-10-CM | POA: Diagnosis not present

## 2021-10-25 DIAGNOSIS — E876 Hypokalemia: Secondary | ICD-10-CM | POA: Diagnosis not present

## 2021-10-25 DIAGNOSIS — Z452 Encounter for adjustment and management of vascular access device: Secondary | ICD-10-CM | POA: Diagnosis not present

## 2021-10-25 DIAGNOSIS — G9341 Metabolic encephalopathy: Secondary | ICD-10-CM | POA: Diagnosis not present

## 2021-10-25 DIAGNOSIS — N2581 Secondary hyperparathyroidism of renal origin: Secondary | ICD-10-CM | POA: Diagnosis not present

## 2021-10-25 DIAGNOSIS — E1122 Type 2 diabetes mellitus with diabetic chronic kidney disease: Secondary | ICD-10-CM | POA: Diagnosis not present

## 2021-10-25 DIAGNOSIS — N186 End stage renal disease: Secondary | ICD-10-CM | POA: Diagnosis not present

## 2021-10-25 DIAGNOSIS — Z992 Dependence on renal dialysis: Secondary | ICD-10-CM | POA: Diagnosis not present

## 2021-10-26 DIAGNOSIS — G9341 Metabolic encephalopathy: Secondary | ICD-10-CM | POA: Diagnosis not present

## 2021-10-26 DIAGNOSIS — I120 Hypertensive chronic kidney disease with stage 5 chronic kidney disease or end stage renal disease: Secondary | ICD-10-CM | POA: Diagnosis not present

## 2021-10-26 DIAGNOSIS — E1122 Type 2 diabetes mellitus with diabetic chronic kidney disease: Secondary | ICD-10-CM | POA: Diagnosis not present

## 2021-10-26 DIAGNOSIS — Z452 Encounter for adjustment and management of vascular access device: Secondary | ICD-10-CM | POA: Diagnosis not present

## 2021-10-26 DIAGNOSIS — F0280 Dementia in other diseases classified elsewhere without behavioral disturbance: Secondary | ICD-10-CM | POA: Diagnosis not present

## 2021-10-26 DIAGNOSIS — G309 Alzheimer's disease, unspecified: Secondary | ICD-10-CM | POA: Diagnosis not present

## 2021-10-26 DIAGNOSIS — Z992 Dependence on renal dialysis: Secondary | ICD-10-CM | POA: Diagnosis not present

## 2021-10-26 DIAGNOSIS — N186 End stage renal disease: Secondary | ICD-10-CM | POA: Diagnosis not present

## 2021-10-27 DIAGNOSIS — E876 Hypokalemia: Secondary | ICD-10-CM | POA: Diagnosis not present

## 2021-10-27 DIAGNOSIS — A498 Other bacterial infections of unspecified site: Secondary | ICD-10-CM | POA: Diagnosis not present

## 2021-10-27 DIAGNOSIS — Z992 Dependence on renal dialysis: Secondary | ICD-10-CM | POA: Diagnosis not present

## 2021-10-27 DIAGNOSIS — N186 End stage renal disease: Secondary | ICD-10-CM | POA: Diagnosis not present

## 2021-10-27 DIAGNOSIS — N2581 Secondary hyperparathyroidism of renal origin: Secondary | ICD-10-CM | POA: Diagnosis not present

## 2021-10-30 DIAGNOSIS — Z992 Dependence on renal dialysis: Secondary | ICD-10-CM | POA: Diagnosis not present

## 2021-10-30 DIAGNOSIS — N186 End stage renal disease: Secondary | ICD-10-CM | POA: Diagnosis not present

## 2021-10-30 DIAGNOSIS — A498 Other bacterial infections of unspecified site: Secondary | ICD-10-CM | POA: Diagnosis not present

## 2021-10-30 DIAGNOSIS — N2581 Secondary hyperparathyroidism of renal origin: Secondary | ICD-10-CM | POA: Diagnosis not present

## 2021-10-30 DIAGNOSIS — E876 Hypokalemia: Secondary | ICD-10-CM | POA: Diagnosis not present

## 2021-10-31 DIAGNOSIS — E1122 Type 2 diabetes mellitus with diabetic chronic kidney disease: Secondary | ICD-10-CM | POA: Diagnosis not present

## 2021-10-31 DIAGNOSIS — F0280 Dementia in other diseases classified elsewhere without behavioral disturbance: Secondary | ICD-10-CM | POA: Diagnosis not present

## 2021-10-31 DIAGNOSIS — I120 Hypertensive chronic kidney disease with stage 5 chronic kidney disease or end stage renal disease: Secondary | ICD-10-CM | POA: Diagnosis not present

## 2021-10-31 DIAGNOSIS — N186 End stage renal disease: Secondary | ICD-10-CM | POA: Diagnosis not present

## 2021-10-31 DIAGNOSIS — G309 Alzheimer's disease, unspecified: Secondary | ICD-10-CM | POA: Diagnosis not present

## 2021-10-31 DIAGNOSIS — G9341 Metabolic encephalopathy: Secondary | ICD-10-CM | POA: Diagnosis not present

## 2021-11-01 DIAGNOSIS — A498 Other bacterial infections of unspecified site: Secondary | ICD-10-CM | POA: Diagnosis not present

## 2021-11-01 DIAGNOSIS — Z992 Dependence on renal dialysis: Secondary | ICD-10-CM | POA: Diagnosis not present

## 2021-11-01 DIAGNOSIS — N2581 Secondary hyperparathyroidism of renal origin: Secondary | ICD-10-CM | POA: Diagnosis not present

## 2021-11-01 DIAGNOSIS — E876 Hypokalemia: Secondary | ICD-10-CM | POA: Diagnosis not present

## 2021-11-01 DIAGNOSIS — N186 End stage renal disease: Secondary | ICD-10-CM | POA: Diagnosis not present

## 2021-11-02 ENCOUNTER — Encounter: Payer: Self-pay | Admitting: Dietician

## 2021-11-02 ENCOUNTER — Encounter: Payer: Medicare Other | Attending: Endocrinology | Admitting: Dietician

## 2021-11-02 ENCOUNTER — Other Ambulatory Visit: Payer: Self-pay

## 2021-11-02 DIAGNOSIS — E119 Type 2 diabetes mellitus without complications: Secondary | ICD-10-CM

## 2021-11-02 DIAGNOSIS — E1165 Type 2 diabetes mellitus with hyperglycemia: Secondary | ICD-10-CM | POA: Insufficient documentation

## 2021-11-02 DIAGNOSIS — Z713 Dietary counseling and surveillance: Secondary | ICD-10-CM | POA: Diagnosis not present

## 2021-11-02 NOTE — Progress Notes (Signed)
Diabetes Self-Management Education  Visit Type: First/Initial  Appt. Start Time: 1445 Appt. End Time: 9629  11/02/2021  [name], identified by name and date of birth, is a 81 y.o. male with a diagnosis of Diabetes: Type 2.   ASSESSMENT Pt wife [name] is present during appointment, states pt has slight dementia. Pt is taking Triujenta QD for their diabetes. Pt has been taking dialysis on M,W,F, since 2018. Pt has been eating PB crackers, and sugar free candies for snacks after dialysis. Wife reports BG can get as high as 180-200 after dialysis. Wife checks BG before dinner, usually runs between 140-160. Pt has lost about 30 pounds in 6 months. Pt is experiencing gastric bleeding occasionally that cause them to lose their appetite, reports LLQ pain. Pt has occasional black stools and incontinence. Pt will be seeing their gastroenterologist tomorrow. Wife reports stomach pain has gotten worse in the past 2 weeks. Pt will eat ham biscuits for breakfast, doesn't really care for much else. Pt reports burning sensation/numbness in their feet that started in the past 2 weeks.  Pt goes to a podiatrist at the [place] to get their toenails clipped every 3 months. Pt eats ice, and drinks diet soda for fluids.  There were no vitals taken for this visit. There is no height or weight on file to calculate BMI.   Diabetes Self-Management Education - 11/02/21 1529       Visit Information   Visit Type First/Initial      Initial Visit   Diabetes Type Type 2    Are you currently following a meal plan? No    Are you taking your medications as prescribed? Yes    Date Diagnosed 1990's      Health Coping   How would you rate your overall health? Poor      Psychosocial Assessment   Patient Belief/Attitude about Diabetes Defeat/Burnout    Self-care barriers Debilitated state [name] to current medical condition;Unsteady gait/risk for falls   ESRD, Early onset dementia   Self-management support Doctor's  office;Support group;Family    Other persons present Patient;Spouse/SO    Patient Concerns Nutrition/Meal planning;Weight Control   unintended weight loss   Special Needs Instruct caregiver    Preferred Learning Style No preference indicated    Learning Readiness Ready    How often do you need to have someone help you when you read instructions, pamphlets, or other written materials from your doctor or pharmacy? 1 - Never    What is the last grade level you completed in school? 4 years of college      Pre-Education Assessment   Patient understands the diabetes disease and treatment process. Needs Instruction    Patient understands incorporating nutritional management into lifestyle. Needs Instruction    Patient undertands incorporating physical activity into lifestyle. Needs Instruction    Patient understands using medications safely. Needs Instruction    Patient understands monitoring blood glucose, interpreting and using results Needs Instruction    Patient understands prevention, detection, and treatment of acute complications. Needs Instruction    Patient understands prevention, detection, and treatment of chronic complications. Needs Instruction    Patient understands how to develop strategies to address psychosocial issues. Needs Instruction    Patient understands how to develop strategies to promote health/change behavior. Needs Instruction      Complications   How often do you check your blood sugar? 1-2 times/day    Fasting Blood glucose range (mg/dL) 130-179    Have you had a  dilated eye exam in the past 12 months? No    Have you had a dental exam in the past 12 months? Yes    Are you checking your feet? Yes    How many days per week are you checking your feet? 7      Dietary Intake   Breakfast Ham biscuit    Lunch 3 PB and Saltine cracker sandwiches    Dinner Broccoli Soup, Sugar free Ice cream      Exercise   Exercise Type ADL's   Difficulty ambulating     Patient  Education   Previous Diabetes Education Yes (please comment)    Nutrition management  Role of diet in the treatment of diabetes and the relationship between the three main macronutrients and blood glucose level;Meal options for control of blood glucose level and chronic complications.    Medications Reviewed patients medication for diabetes, action, purpose, timing of dose and side effects.    Monitoring Identified appropriate SMBG and/or A1C goals.;Daily foot exams    Chronic complications Nephropathy, what it is, prevention of, the use of ACE, ARB's and early detection of through urine microalbumia.;Assessed and discussed foot care and prevention of foot problems;Relationship between chronic complications and blood glucose control      Individualized Goals (developed by patient)   Nutrition General guidelines for healthy choices and portions discussed    Physical Activity Not Applicable    Medications take my medication as prescribed    Monitoring  test my blood glucose as discussed      Post-Education Assessment   Patient understands the diabetes disease and treatment process. Needs Review    Patient understands incorporating nutritional management into lifestyle. Needs Review    Patient undertands incorporating physical activity into lifestyle. Needs Review    Patient understands using medications safely. Needs Review    Patient understands monitoring blood glucose, interpreting and using results Needs Review    Patient understands prevention, detection, and treatment of acute complications. Needs Review    Patient understands prevention, detection, and treatment of chronic complications. Needs Review    Patient understands how to develop strategies to address psychosocial issues. Needs Review    Patient understands how to develop strategies to promote health/change behavior. Needs Review      Outcomes   Expected Outcomes Demonstrated interest in learning. Expect positive outcomes     Future DMSE 4-6 wks    Program Status Not Completed             Individualized Plan for Diabetes Self-Management Training:   Learning Objective:  Patient will have a greater understanding of diabetes self-management. Patient education plan is to attend individual and/or group sessions per assessed needs and concerns.   Plan:   Patient Instructions  Look into adding a second Nepro supplement in the evening. Try to have 2 supplements every day. If missing a meal, have one in place of the meal.  Look into [place] brand high protein cereals for a high calorie, high protein option. Have it with Fairlife brand whole milk. Add a boiled egg to your breakfast for more protein.  Try to increase your fluid intake to 1.5 L per day. Add some water to your ice, try sugar free popsicles, or sugar free jello as well.  Check your blood sugar 2 hours after starting your afternoon snack. Blood glucose should always stay under 180.  Check your feet daily!! Use the "Foot Care" guide in your ADA book to prevent complications of neuropathy.  Expected Outcomes:  Demonstrated interest in learning. Expect positive outcomes  Education material provided: ADA - How to Thrive: A Guide for Your Journey with Diabetes  If problems or questions, patient to contact team via:  Phone and Email  Future DSME appointment: 4-6 wks

## 2021-11-02 NOTE — Patient Instructions (Addendum)
Look into adding a second Nepro supplement in the evening. Try to have 2 supplements every day. If missing a meal, have one in place of the meal.  Look into [place] brand high protein cereals for a high calorie, high protein option. Have it with Fairlife brand whole milk. Add a boiled egg to your breakfast for more protein.  Try to increase your fluid intake to 1.5 L per day. Add some water to your ice, try sugar free popsicles, or sugar free jello as well.  Check your blood sugar 2 hours after starting your afternoon snack. Blood glucose should always stay under 180.  Check your feet daily!! Use the "Foot Care" guide in your ADA book to prevent complications of neuropathy.

## 2021-11-03 ENCOUNTER — Other Ambulatory Visit (INDEPENDENT_AMBULATORY_CARE_PROVIDER_SITE_OTHER): Payer: Medicare Other

## 2021-11-03 ENCOUNTER — Encounter: Payer: Self-pay | Admitting: Gastroenterology

## 2021-11-03 ENCOUNTER — Ambulatory Visit (HOSPITAL_BASED_OUTPATIENT_CLINIC_OR_DEPARTMENT_OTHER)
Admission: RE | Admit: 2021-11-03 | Discharge: 2021-11-03 | Disposition: A | Discharge status: Home / Self Care | Payer: Medicare Other | Source: Ambulatory Visit | Attending: Gastroenterology | Admitting: Gastroenterology

## 2021-11-03 ENCOUNTER — Ambulatory Visit (INDEPENDENT_AMBULATORY_CARE_PROVIDER_SITE_OTHER): Payer: Medicare Other | Admitting: Gastroenterology

## 2021-11-03 VITALS — BP 160/20 | HR 63 | Ht 71.0 in | Wt 147.0 lb

## 2021-11-03 DIAGNOSIS — M25552 Pain in left hip: Secondary | ICD-10-CM | POA: Diagnosis not present

## 2021-11-03 DIAGNOSIS — K5520 Angiodysplasia of colon without hemorrhage: Secondary | ICD-10-CM

## 2021-11-03 DIAGNOSIS — N186 End stage renal disease: Secondary | ICD-10-CM | POA: Diagnosis not present

## 2021-11-03 DIAGNOSIS — I10 Essential (primary) hypertension: Secondary | ICD-10-CM | POA: Diagnosis not present

## 2021-11-03 DIAGNOSIS — K5521 Angiodysplasia of colon with hemorrhage: Secondary | ICD-10-CM | POA: Diagnosis present

## 2021-11-03 DIAGNOSIS — I959 Hypotension, unspecified: Secondary | ICD-10-CM | POA: Diagnosis not present

## 2021-11-03 DIAGNOSIS — A498 Other bacterial infections of unspecified site: Secondary | ICD-10-CM | POA: Diagnosis not present

## 2021-11-03 DIAGNOSIS — K31819 Angiodysplasia of stomach and duodenum without bleeding: Secondary | ICD-10-CM | POA: Diagnosis not present

## 2021-11-03 DIAGNOSIS — D500 Iron deficiency anemia secondary to blood loss (chronic): Secondary | ICD-10-CM | POA: Diagnosis not present

## 2021-11-03 DIAGNOSIS — I2510 Atherosclerotic heart disease of native coronary artery without angina pectoris: Secondary | ICD-10-CM | POA: Diagnosis not present

## 2021-11-03 DIAGNOSIS — D649 Anemia, unspecified: Secondary | ICD-10-CM | POA: Diagnosis not present

## 2021-11-03 DIAGNOSIS — R195 Other fecal abnormalities: Secondary | ICD-10-CM

## 2021-11-03 DIAGNOSIS — E039 Hypothyroidism, unspecified: Secondary | ICD-10-CM | POA: Diagnosis not present

## 2021-11-03 DIAGNOSIS — R9431 Abnormal electrocardiogram [ECG] [EKG]: Secondary | ICD-10-CM | POA: Diagnosis not present

## 2021-11-03 DIAGNOSIS — K449 Diaphragmatic hernia without obstruction or gangrene: Secondary | ICD-10-CM | POA: Diagnosis not present

## 2021-11-03 DIAGNOSIS — E875 Hyperkalemia: Secondary | ICD-10-CM | POA: Diagnosis present

## 2021-11-03 DIAGNOSIS — K219 Gastro-esophageal reflux disease without esophagitis: Secondary | ICD-10-CM | POA: Diagnosis present

## 2021-11-03 DIAGNOSIS — D638 Anemia in other chronic diseases classified elsewhere: Secondary | ICD-10-CM | POA: Diagnosis not present

## 2021-11-03 DIAGNOSIS — D62 Acute posthemorrhagic anemia: Secondary | ICD-10-CM | POA: Diagnosis not present

## 2021-11-03 DIAGNOSIS — R1032 Left lower quadrant pain: Secondary | ICD-10-CM

## 2021-11-03 DIAGNOSIS — E1122 Type 2 diabetes mellitus with diabetic chronic kidney disease: Secondary | ICD-10-CM | POA: Diagnosis present

## 2021-11-03 DIAGNOSIS — K3189 Other diseases of stomach and duodenum: Secondary | ICD-10-CM | POA: Diagnosis not present

## 2021-11-03 DIAGNOSIS — N250 Renal osteodystrophy: Secondary | ICD-10-CM | POA: Diagnosis not present

## 2021-11-03 DIAGNOSIS — K31811 Angiodysplasia of stomach and duodenum with bleeding: Secondary | ICD-10-CM | POA: Diagnosis not present

## 2021-11-03 DIAGNOSIS — R300 Dysuria: Secondary | ICD-10-CM | POA: Diagnosis not present

## 2021-11-03 DIAGNOSIS — K922 Gastrointestinal hemorrhage, unspecified: Secondary | ICD-10-CM | POA: Diagnosis not present

## 2021-11-03 DIAGNOSIS — K921 Melena: Secondary | ICD-10-CM | POA: Diagnosis not present

## 2021-11-03 DIAGNOSIS — Z8679 Personal history of other diseases of the circulatory system: Secondary | ICD-10-CM | POA: Diagnosis not present

## 2021-11-03 DIAGNOSIS — N390 Urinary tract infection, site not specified: Secondary | ICD-10-CM | POA: Diagnosis present

## 2021-11-03 DIAGNOSIS — D696 Thrombocytopenia, unspecified: Secondary | ICD-10-CM | POA: Diagnosis not present

## 2021-11-03 DIAGNOSIS — R109 Unspecified abdominal pain: Secondary | ICD-10-CM | POA: Diagnosis not present

## 2021-11-03 DIAGNOSIS — Z880 Allergy status to penicillin: Secondary | ICD-10-CM | POA: Diagnosis not present

## 2021-11-03 DIAGNOSIS — I120 Hypertensive chronic kidney disease with stage 5 chronic kidney disease or end stage renal disease: Secondary | ICD-10-CM | POA: Diagnosis not present

## 2021-11-03 DIAGNOSIS — B952 Enterococcus as the cause of diseases classified elsewhere: Secondary | ICD-10-CM | POA: Diagnosis present

## 2021-11-03 DIAGNOSIS — Z992 Dependence on renal dialysis: Secondary | ICD-10-CM | POA: Diagnosis not present

## 2021-11-03 DIAGNOSIS — I739 Peripheral vascular disease, unspecified: Secondary | ICD-10-CM | POA: Diagnosis present

## 2021-11-03 DIAGNOSIS — E785 Hyperlipidemia, unspecified: Secondary | ICD-10-CM | POA: Diagnosis present

## 2021-11-03 DIAGNOSIS — D631 Anemia in chronic kidney disease: Secondary | ICD-10-CM | POA: Diagnosis not present

## 2021-11-03 DIAGNOSIS — L89152 Pressure ulcer of sacral region, stage 2: Secondary | ICD-10-CM | POA: Diagnosis present

## 2021-11-03 DIAGNOSIS — N2581 Secondary hyperparathyroidism of renal origin: Secondary | ICD-10-CM | POA: Diagnosis not present

## 2021-11-03 DIAGNOSIS — G4730 Sleep apnea, unspecified: Secondary | ICD-10-CM | POA: Diagnosis present

## 2021-11-03 DIAGNOSIS — B9620 Unspecified Escherichia coli [E. coli] as the cause of diseases classified elsewhere: Secondary | ICD-10-CM | POA: Diagnosis present

## 2021-11-03 DIAGNOSIS — R1084 Generalized abdominal pain: Secondary | ICD-10-CM | POA: Diagnosis not present

## 2021-11-03 DIAGNOSIS — R1030 Lower abdominal pain, unspecified: Secondary | ICD-10-CM | POA: Diagnosis not present

## 2021-11-03 DIAGNOSIS — E876 Hypokalemia: Secondary | ICD-10-CM | POA: Diagnosis not present

## 2021-11-03 DIAGNOSIS — F0280 Dementia in other diseases classified elsewhere without behavioral disturbance: Secondary | ICD-10-CM | POA: Diagnosis present

## 2021-11-03 DIAGNOSIS — G309 Alzheimer's disease, unspecified: Secondary | ICD-10-CM | POA: Diagnosis not present

## 2021-11-03 DIAGNOSIS — N3000 Acute cystitis without hematuria: Secondary | ICD-10-CM | POA: Diagnosis not present

## 2021-11-03 DIAGNOSIS — S72001A Fracture of unspecified part of neck of right femur, initial encounter for closed fracture: Secondary | ICD-10-CM | POA: Diagnosis not present

## 2021-11-03 LAB — CBC
HCT: 32.2 % — ABNORMAL LOW (ref 39.0–52.0)
Hemoglobin: 10.6 g/dL — ABNORMAL LOW (ref 13.0–17.0)
MCHC: 32.9 g/dL (ref 30.0–36.0)
MCV: 102.7 fl — ABNORMAL HIGH (ref 78.0–100.0)
Platelets: 169 10*3/uL (ref 150.0–400.0)
RBC: 3.13 Mil/uL — ABNORMAL LOW (ref 4.22–5.81)
RDW: 15.7 % — ABNORMAL HIGH (ref 11.5–15.5)
WBC: 9.2 10*3/uL (ref 4.0–10.5)

## 2021-11-03 NOTE — Progress Notes (Signed)
Chief Complaint:    Abdominal pain, dark stools  GI History: [name] is an 81 y.o. male.  Hx ESRD on hemodialysis.  CAD.  DM 2.  Hypothyroidism.  Dementia, hypertension.  Diverticulitis.   AAA repair 1992.  Subdural hematoma evacuation 2019.  Anemia of chronic dz and of GI blood loss, PRBCs in 05/2018, 10/2018.   History of GI bleeds manifesting as melena and acute on chronic anemia dating back to 2019 with findings of upper intestinal AVMs.   10/2017 EGD.  Nonobstructing, mild distal esophageal stricture was dilated.  Otherwise normal study. 10/2017 colonoscopy.  Tubular adenomatous polyp removed from transverse colon, hyperplastic polyp removed from rectosigmoid.  Solitary, nonbleeding erosion at transverse colon with no stigmata of or active bleeding.  Scattered small and large diverticula throughout the colon.  Nonbleeding, small internal hemorrhoids 05/2018 SBE: AVM with stigmata of recent bleeding at gastric fundis treated with APC. 10/2018 SBE. For anemia, obscure GI bleeding, history of gastric AVM.  Findings of a single, nonbleeding gastric AVM treated with APC.  Gastric erythema.  2 spots of oozing in the duodenum treated with APC.  3, nonbleeding AVMs in jejunum treated with APC.  Tattoo placed at distal extent of exam in jejunum 02/2019 capsule endoscopy.  No active bleeding or oozing.  There were few tiny red spots, possible AVMs in mid small bowel at 2 hours 30 minutes.  Distal, nonbleeding, small bowel AVMs at 7 hours 13 minutes 11/2019 SBE.  Sliding hiatal hernia.  Nonobstructing Schatzki's ring.  2, nonbleeding, AVMs in third and fourth duodenum treated with APC.  3, nonbleeding, AVMs in proximal jejunum treated with APC.  Previously placed tattoos visualized. 06/2021 push enteroscopy Nonbleeding gastric AVM, AVM in D4, 2 AVMs in proximal jejunum all treated with APC.  Started on octreotide 50 mcg bid  HPI:     Patient is a 81 y.o. male presenting to the [facility]  for evaluation of abdominal pain.  He was hospitalized in 06/2021 with acute on chronic anemia in the setting of abdominal pain and dark stools.  Small bowel enteroscopy with gastric, duodenal, and jejunal x2 AVMs, all nonbleeding, all treated with APC.  He was started on octreotide 50 mcg twice daily.  Hospitalized January 8-12, 2023 with acute metabolic encephalopathy, thought 2/2 recently starting Korsuva, also complicated by underlying dementia, uremia (BUN 63), possible UTI.  Ammonia 37.  Was dialyzed, treated with Rocephin for presumed UTI with improvement in mentation back to baseline.  Hypernatremia improved with HD.  Did have abdominal pain during hospitalization with unremarkable CT abdomen/pelvis x2 on that admission.  Was seen in follow-up by Palliative Care (home care) on 10/19/2021.  Was seen in the [facility] yesterday.  Today, he states stools have darkened again, but not melena. Stool cards were obtained yesterday and will send in to lab today. Also with L>R lower abdominal pain. Same location, quality as when he was hspitalized last month. Occasional constipation. Will use Miralax prn.   Very rarely produces urine.     CBC Latest Ref Rng & Units 10/03/2021 10/02/2021 10/02/2021  WBC 4.0 - 10.5 K/uL 8.7 9.6 -  Hemoglobin 13.0 - 17.0 g/dL 12.6(L) 12.2(L) 11.6(L)  Hematocrit 39.0 - 52.0 % 37.8(L) 38.8(L) 34.0(L)  Platelets 150 - 400 K/uL 119(L) 140(L) -     Review of systems:     No chest pain, no SOB, no fevers, no urinary sx   Past Medical History:  Diagnosis Date   Allergy  Alzheimer's disease ([place]) 01/19/2021   Anemia    Arthritis    Cataract    bil cateracts removed   Coronary artery disease    Dementia arising in the senium and presenium ([place])    Diabetes mellitus    Type 2   Diverticulitis    ED (erectile dysfunction)    Elevated homocysteine    ESRD (end stage renal disease) on dialysis ([place]) 03/2015   M-W-F dialysis   GERD (gastroesophageal reflux  disease)    pepto    Gout    Hiatal hernia    Hyperlipidemia    Hypertension    Hypothyroidism    PVD (peripheral vascular disease) ([place])    has plastic aorta   Renal insufficiency    Sleep apnea    does not wear c-pap    Patient's surgical history, family medical history, social history, medications and allergies were all reviewed in Epic    Current Outpatient Medications  Medication Sig Dispense Refill   acetaminophen (TYLENOL) 325 MG tablet Take 1-2 tablets (325-650 mg total) by mouth every 4 (four) hours as needed for mild pain.     albuterol (VENTOLIN HFA) 108 (90 Base) MCG/ACT inhaler Inhale 1 puff into the lungs daily as needed for wheezing or shortness of breath.     allopurinol (ZYLOPRIM) 100 MG tablet Take 100 mg by mouth daily.     Blood Glucose Monitoring Suppl (FREESTYLE FREEDOM LITE) w/Device KIT Use to check blood sugar 2 times per day dx code E11.65 1 each 0   camphor-menthol (SARNA) lotion Apply 1 application topically 4 (four) times daily as needed for itching.      Carboxymethylcellulose Sodium (THERATEARS) 0.25 % SOLN Place 1 drop into both eyes as needed (for dryness).     cetirizine (ZYRTEC) 10 MG tablet Take 10 mg by mouth daily as needed for allergies.     clobetasol (TEMOVATE) 0.05 % external solution Apply 1 application topically 2 (two) times daily as needed (scalp irritation.).   0   donepezil (ARICEPT) 10 MG tablet Take 1 tablet (10 mg total) by mouth at bedtime. 90 tablet 0   FLUOCINOLONE ACETONIDE SCALP 0.01 % OIL Apply 1 application topically daily as needed (scalp irritation.).   0   fluticasone (FLONASE) 50 MCG/ACT nasal spray Place 1 spray into both nostrils daily. (Patient taking differently: Place 1 spray into both nostrils daily as needed for allergies.) 16 g 2   glucose blood (FREESTYLE LITE) test strip USE AS DIRECTED THREE TIMES DAILY (Patient taking differently: Check blood sugar bid) 300 strip 2   hydrocortisone 2.5 % cream Apply 1 application  topically 3 (three) times daily as needed (skin irritation (legs & arms)).      Insulin Pen Needle (PEN NEEDLES) 32G X 4 MM MISC 1 each by Does not apply route 3 (three) times daily. USE PEN NEEDLES TO INJECT INSULIN THREE TIMES DAILY. 100 each 12   ketoconazole (NIZORAL) 2 % shampoo Apply 1 application topically 2 (two) times a week. 120 mL 0   Lancets (FREESTYLE) lancets Use as instructed to check blood sugar 2 times per day dx code E11.65 100 each 3   levothyroxine (SYNTHROID, LEVOTHROID) 50 MCG tablet Take 1 tablet (50 mcg total) by mouth daily. (Patient taking differently: Take 50 mcg by mouth daily before breakfast.) 90 tablet 1   lidocaine (LIDODERM) 5 % Place 1 patch onto the skin daily as needed (back pain).     memantine (NAMENDA) 10 MG tablet Take  1 tablet (10 mg total) by mouth 2 (two) times daily. 180 tablet 0   midodrine (PROAMATINE) 10 MG tablet Take one pill prior to hemodialysis on MWF (Patient taking differently: Take 5 mg by mouth See admin instructions. Take one tablet by mouth prior to hemodialysis on MWF)     Nutritional Supplements (FEEDING SUPPLEMENT, NEPRO CARB STEADY,) LIQD Take 237 mLs by mouth daily.     omeprazole (PRILOSEC) 40 MG capsule Take 1 capsule (40 mg total) by mouth daily. 30 capsule 0   sevelamer carbonate (RENVELA) 800 MG tablet Take 1,600-2,400 mg by mouth See admin instructions. Take 2,400 mg by mouth three times a day with meals and 1,600 mg with snacks on Sun/Tues/Thurs/Sat and 2,400 mg with breakfast and dinner and nothing with snacks on Mon/Wed/Fri     simvastatin (ZOCOR) 20 MG tablet Take 20 mg by mouth at bedtime.     Teriparatide, Recombinant, (FORTEO) 600 MCG/2.4ML SOPN Inject 20 mcg subcutaneously daily (Patient taking differently: Inject 20 mcg into the skin at bedtime.) 7.2 mL 1   TRADJENTA 5 MG TABS tablet TAKE 1 TABLET DAILY (Patient taking differently: Take 5 mg by mouth daily.) 90 tablet 3   No current facility-administered medications for this  visit.    Physical Exam:     BP (!) 160/20    Pulse 63    Ht 5' 11"  (1.803 m)    Wt 147 lb (66.7 kg)    SpO2 99%    BMI 20.50 kg/m   GENERAL:  Pleasant male in NAD, sitting in chair of his walker PSYCH: : Cooperative, normal affect CARDIAC:  RRR, no murmur heard, no peripheral edema PULM: Normal respiratory effort, lungs CTA bilaterally, no wheezing ABDOMEN:  Nondistended, soft, nontender. No obvious masses, no hepatomegaly,  normal bowel sounds SKIN:  turgor, no lesions seen Musculoskeletal: Pain along top of left sacral crest and around to left pelvis.  No overlying ecchymosis.    IMPRESSION and PLAN:    1) History of gastric and small bowel AVMs 2) Dark stools Given his history of AVMs and prior bleed in the past, plan to evaluate his recent onset dark stools as follows: - Check CBC today - Restart octreotide.  Not sure when this medication was stopped, but was previously on 50 mcg bid dosing.  Will submit paperwork today to start octreotide LAR 20 mg monthly - Discussed ER return precautions if increasing concern for bleeding or concern for symptomatic anemia  3) Left hip pain/LLQ pain - Pain he is describing seems more related to MSK and possibly left hip - Recent CT abdomen/pelvis x2 otherwise unrevealing for intra-abdominal pathology - Left hip x-ray - Follow-up with PCM - If x-ray unrevealing and symptoms persist, can discuss with his Nephrologist about how to evaluate/treat possible UTI given recent admission but otherwise anuric   I spent 35 minutes of time, including in depth chart review, independent review of results as outlined above, communicating results with the patient directly, face-to-face time with the patient, coordinating care, and ordering studies and medications as appropriate, and documentation.         [place] ,DO, FACG 11/03/2021, 11:19 AM

## 2021-11-03 NOTE — Patient Instructions (Signed)
If you are age 81 or older, your body mass index should be between 23-30. Your Body mass index is 20.5 kg/m. If this is out of the aforementioned range listed, please consider follow up with your Primary Care Provider.  If you are age 29 or younger, your body mass index should be between 19-25. Your Body mass index is 20.5 kg/m. If this is out of the aformentioned range listed, please consider follow up with your Primary Care Provider.   __________________________________________________________  The [facility] providers would like to encourage you to use [facility] to communicate with providers for non-urgent requests or questions.  Due to long hold times on the telephone, sending your provider a message by [facility] may be a faster and more efficient way to get a response.  Please allow 48 business hours for a response.  Please remember that this is for non-urgent requests.   Please go to the lab on the 2nd floor [address] before you leave the office today.   Please go to the first floor radiology for your hip xray before you leave today.  Follow up with you family Doctor.  Thank you for choosing me and [facility].  Vito Cirigliano, D.O.

## 2021-11-04 ENCOUNTER — Encounter (HOSPITAL_COMMUNITY): Payer: Self-pay

## 2021-11-04 ENCOUNTER — Telehealth: Payer: Self-pay | Admitting: Nurse Practitioner

## 2021-11-04 ENCOUNTER — Inpatient Hospital Stay (HOSPITAL_COMMUNITY)
Admission: EM | Admit: 2021-11-04 | Discharge: 2021-11-06 | DRG: 377 | Disposition: A | Discharge status: Home Health | Payer: Medicare Other | Attending: Internal Medicine | Admitting: Internal Medicine

## 2021-11-04 DIAGNOSIS — Z8679 Personal history of other diseases of the circulatory system: Secondary | ICD-10-CM

## 2021-11-04 DIAGNOSIS — K31819 Angiodysplasia of stomach and duodenum without bleeding: Secondary | ICD-10-CM | POA: Diagnosis not present

## 2021-11-04 DIAGNOSIS — I2510 Atherosclerotic heart disease of native coronary artery without angina pectoris: Secondary | ICD-10-CM | POA: Diagnosis present

## 2021-11-04 DIAGNOSIS — E785 Hyperlipidemia, unspecified: Secondary | ICD-10-CM | POA: Diagnosis present

## 2021-11-04 DIAGNOSIS — E1122 Type 2 diabetes mellitus with diabetic chronic kidney disease: Secondary | ICD-10-CM | POA: Diagnosis present

## 2021-11-04 DIAGNOSIS — E039 Hypothyroidism, unspecified: Secondary | ICD-10-CM | POA: Diagnosis present

## 2021-11-04 DIAGNOSIS — Z833 Family history of diabetes mellitus: Secondary | ICD-10-CM

## 2021-11-04 DIAGNOSIS — I120 Hypertensive chronic kidney disease with stage 5 chronic kidney disease or end stage renal disease: Secondary | ICD-10-CM | POA: Diagnosis present

## 2021-11-04 DIAGNOSIS — K3189 Other diseases of stomach and duodenum: Secondary | ICD-10-CM | POA: Diagnosis not present

## 2021-11-04 DIAGNOSIS — L8990 Pressure ulcer of unspecified site, unspecified stage: Secondary | ICD-10-CM | POA: Insufficient documentation

## 2021-11-04 DIAGNOSIS — G309 Alzheimer's disease, unspecified: Secondary | ICD-10-CM | POA: Diagnosis present

## 2021-11-04 DIAGNOSIS — M545 Low back pain, unspecified: Secondary | ICD-10-CM | POA: Diagnosis present

## 2021-11-04 DIAGNOSIS — N250 Renal osteodystrophy: Secondary | ICD-10-CM | POA: Diagnosis not present

## 2021-11-04 DIAGNOSIS — R1032 Left lower quadrant pain: Secondary | ICD-10-CM | POA: Diagnosis not present

## 2021-11-04 DIAGNOSIS — N3000 Acute cystitis without hematuria: Secondary | ICD-10-CM | POA: Diagnosis not present

## 2021-11-04 DIAGNOSIS — N186 End stage renal disease: Secondary | ICD-10-CM | POA: Diagnosis present

## 2021-11-04 DIAGNOSIS — Z8249 Family history of ischemic heart disease and other diseases of the circulatory system: Secondary | ICD-10-CM

## 2021-11-04 DIAGNOSIS — L89152 Pressure ulcer of sacral region, stage 2: Secondary | ICD-10-CM | POA: Diagnosis present

## 2021-11-04 DIAGNOSIS — D696 Thrombocytopenia, unspecified: Secondary | ICD-10-CM | POA: Diagnosis present

## 2021-11-04 DIAGNOSIS — I959 Hypotension, unspecified: Secondary | ICD-10-CM | POA: Diagnosis not present

## 2021-11-04 DIAGNOSIS — Z992 Dependence on renal dialysis: Secondary | ICD-10-CM | POA: Diagnosis not present

## 2021-11-04 DIAGNOSIS — D62 Acute posthemorrhagic anemia: Secondary | ICD-10-CM | POA: Diagnosis present

## 2021-11-04 DIAGNOSIS — F0280 Dementia in other diseases classified elsewhere without behavioral disturbance: Secondary | ICD-10-CM | POA: Diagnosis present

## 2021-11-04 DIAGNOSIS — Z880 Allergy status to penicillin: Secondary | ICD-10-CM

## 2021-11-04 DIAGNOSIS — K5521 Angiodysplasia of colon with hemorrhage: Secondary | ICD-10-CM | POA: Diagnosis present

## 2021-11-04 DIAGNOSIS — G4730 Sleep apnea, unspecified: Secondary | ICD-10-CM | POA: Diagnosis present

## 2021-11-04 DIAGNOSIS — N390 Urinary tract infection, site not specified: Secondary | ICD-10-CM | POA: Diagnosis present

## 2021-11-04 DIAGNOSIS — R1030 Lower abdominal pain, unspecified: Secondary | ICD-10-CM | POA: Diagnosis not present

## 2021-11-04 DIAGNOSIS — D649 Anemia, unspecified: Secondary | ICD-10-CM | POA: Diagnosis not present

## 2021-11-04 DIAGNOSIS — N2581 Secondary hyperparathyroidism of renal origin: Secondary | ICD-10-CM | POA: Diagnosis present

## 2021-11-04 DIAGNOSIS — K449 Diaphragmatic hernia without obstruction or gangrene: Secondary | ICD-10-CM | POA: Diagnosis present

## 2021-11-04 DIAGNOSIS — E875 Hyperkalemia: Secondary | ICD-10-CM | POA: Diagnosis present

## 2021-11-04 DIAGNOSIS — R109 Unspecified abdominal pain: Secondary | ICD-10-CM | POA: Diagnosis not present

## 2021-11-04 DIAGNOSIS — K219 Gastro-esophageal reflux disease without esophagitis: Secondary | ICD-10-CM | POA: Diagnosis present

## 2021-11-04 DIAGNOSIS — Z5329 Procedure and treatment not carried out because of patient's decision for other reasons: Secondary | ICD-10-CM | POA: Diagnosis not present

## 2021-11-04 DIAGNOSIS — Z87891 Personal history of nicotine dependence: Secondary | ICD-10-CM

## 2021-11-04 DIAGNOSIS — K922 Gastrointestinal hemorrhage, unspecified: Secondary | ICD-10-CM

## 2021-11-04 DIAGNOSIS — R300 Dysuria: Secondary | ICD-10-CM | POA: Diagnosis not present

## 2021-11-04 DIAGNOSIS — R9431 Abnormal electrocardiogram [ECG] [EKG]: Secondary | ICD-10-CM | POA: Diagnosis not present

## 2021-11-04 DIAGNOSIS — B952 Enterococcus as the cause of diseases classified elsewhere: Secondary | ICD-10-CM | POA: Diagnosis present

## 2021-11-04 DIAGNOSIS — B9620 Unspecified Escherichia coli [E. coli] as the cause of diseases classified elsewhere: Secondary | ICD-10-CM | POA: Diagnosis present

## 2021-11-04 DIAGNOSIS — D638 Anemia in other chronic diseases classified elsewhere: Secondary | ICD-10-CM | POA: Diagnosis not present

## 2021-11-04 DIAGNOSIS — I10 Essential (primary) hypertension: Secondary | ICD-10-CM | POA: Diagnosis not present

## 2021-11-04 DIAGNOSIS — R1084 Generalized abdominal pain: Secondary | ICD-10-CM | POA: Diagnosis not present

## 2021-11-04 DIAGNOSIS — Z823 Family history of stroke: Secondary | ICD-10-CM

## 2021-11-04 DIAGNOSIS — I739 Peripheral vascular disease, unspecified: Secondary | ICD-10-CM | POA: Diagnosis present

## 2021-11-04 DIAGNOSIS — K31811 Angiodysplasia of stomach and duodenum with bleeding: Secondary | ICD-10-CM | POA: Diagnosis not present

## 2021-11-04 DIAGNOSIS — M898X9 Other specified disorders of bone, unspecified site: Secondary | ICD-10-CM | POA: Diagnosis present

## 2021-11-04 DIAGNOSIS — Z9861 Coronary angioplasty status: Secondary | ICD-10-CM

## 2021-11-04 DIAGNOSIS — D631 Anemia in chronic kidney disease: Secondary | ICD-10-CM | POA: Diagnosis not present

## 2021-11-04 DIAGNOSIS — D500 Iron deficiency anemia secondary to blood loss (chronic): Secondary | ICD-10-CM | POA: Diagnosis not present

## 2021-11-04 LAB — COMPREHENSIVE METABOLIC PANEL
ALT: 11 U/L (ref 0–44)
AST: 19 U/L (ref 15–41)
Albumin: 3.1 g/dL — ABNORMAL LOW (ref 3.5–5.0)
Alkaline Phosphatase: 229 U/L — ABNORMAL HIGH (ref 38–126)
Anion gap: 13 (ref 5–15)
BUN: 50 mg/dL — ABNORMAL HIGH (ref 8–23)
CO2: 30 mmol/L (ref 22–32)
Calcium: 9.1 mg/dL (ref 8.9–10.3)
Chloride: 99 mmol/L (ref 98–111)
Creatinine, Ser: 7.6 mg/dL — ABNORMAL HIGH (ref 0.61–1.24)
GFR, Estimated: 7 mL/min — ABNORMAL LOW (ref >60)
Glucose, Bld: 125 mg/dL — ABNORMAL HIGH (ref 70–99)
Potassium: 5.7 mmol/L — ABNORMAL HIGH (ref 3.5–5.1)
Sodium: 142 mmol/L (ref 135–145)
Total Bilirubin: 0.1 mg/dL — ABNORMAL LOW (ref 0.3–1.2)
Total Protein: 5.9 g/dL — ABNORMAL LOW (ref 6.5–8.1)

## 2021-11-04 LAB — CBC WITH DIFFERENTIAL/PLATELET
Abs Immature Granulocytes: 0.03 10*3/uL (ref 0.00–0.07)
Basophils Absolute: 0.1 10*3/uL (ref 0.0–0.1)
Basophils Relative: 1 %
Eosinophils Absolute: 0.3 10*3/uL (ref 0.0–0.5)
Eosinophils Relative: 4 %
HCT: 30.8 % — ABNORMAL LOW (ref 39.0–52.0)
Hemoglobin: 9.6 g/dL — ABNORMAL LOW (ref 13.0–17.0)
Immature Granulocytes: 0 %
Lymphocytes Relative: 21 %
Lymphs Abs: 1.8 10*3/uL (ref 0.7–4.0)
MCH: 33.6 pg (ref 26.0–34.0)
MCHC: 31.2 g/dL (ref 30.0–36.0)
MCV: 107.7 fL — ABNORMAL HIGH (ref 80.0–100.0)
Monocytes Absolute: 1 10*3/uL (ref 0.1–1.0)
Monocytes Relative: 11 %
Neutro Abs: 5.7 10*3/uL (ref 1.7–7.7)
Neutrophils Relative %: 63 %
Platelets: 138 10*3/uL — ABNORMAL LOW (ref 150–400)
RBC: 2.86 MIL/uL — ABNORMAL LOW (ref 4.22–5.81)
RDW: 15.1 % (ref 11.5–15.5)
WBC: 8.9 10*3/uL (ref 4.0–10.5)
nRBC: 0 % (ref 0.0–0.2)

## 2021-11-04 LAB — LIPASE, BLOOD: Lipase: 53 U/L — ABNORMAL HIGH (ref 11–51)

## 2021-11-04 LAB — POC OCCULT BLOOD, ED: Fecal Occult Bld: NEGATIVE

## 2021-11-04 MED ORDER — MELATONIN 3 MG PO TABS
3.0000 mg | ORAL_TABLET | Freq: Every evening | ORAL | Status: DC | PRN
  Administered 2021-11-04 – 2021-11-05 (×2): 3 mg via ORAL
  Filled 2021-11-04 (×2): qty 1

## 2021-11-04 MED ORDER — MORPHINE SULFATE (PF) 4 MG/ML IV SOLN: 2.0000 mg | Freq: Once | INTRAVENOUS | Status: DC

## 2021-11-04 MED ORDER — ASPIRIN EC 81 MG PO TBEC: 81.0000 mg | DELAYED_RELEASE_TABLET | Freq: Every day | ORAL | Status: DC

## 2021-11-04 MED ORDER — SODIUM CHLORIDE 0.9 % IV SOLN
1.0000 g | INTRAVENOUS | Status: DC
  Administered 2021-11-04 – 2021-11-05 (×2): 1 g via INTRAVENOUS
  Filled 2021-11-04 (×3): qty 10

## 2021-11-04 NOTE — ED Notes (Signed)
ED Provider at bedside. 

## 2021-11-04 NOTE — Telephone Encounter (Signed)
Patient's wife called the on-call GI service at this time.  Patient was seen by Dr. Bryan Lemma in the office yesterday but awakened today with worsening severe left lower abdominal pain which radiates to the groin and across to the right lower abdomen.  He has had 2 recent CTAP's which were unrevealing.  However, since the patient's pain is severe at this point I advised the patient to go to the ED for repeat lab results and possible repeat CTAP.  His hemoglobin yesterday  10.6 and 1 month ago Hg was 12.6.

## 2021-11-04 NOTE — ED Triage Notes (Signed)
Woke up today with abdominal pain, left lower abdomen and rectum/buttock and dark stool, dialysis yesterday, right arm restricted, hx dementia.

## 2021-11-04 NOTE — ED Notes (Signed)
Admitting MD at bedside for admission, delaying moving upstairs

## 2021-11-04 NOTE — H&P (Signed)
History and Physical    Patient: [name] YIR:[id number] DOB: [birth year] DOA: 11/04/2021 DOS: the patient was seen and examined on 11/04/2021 PCP: Elby Showers, MD  Patient coming from: Home  Chief Complaint:  Chief Complaint  Patient presents with   Abdominal Pain    HPI: [name] is a 81 y.o. male with medical history significant of ESRD on HD MWF, Alzheimer's disease, hypothyroidism, type 2 diabetes, history of AAA repair, subdural hematoma evacuation, history of GI bleed with AVMs who presents with concerns of severe left-sided abdominal pain.  Patient provides limited history.  States for the past several days he has been noting left-sided abdominal pain radiating to the right.  Feels pain is constant and worse with food.  He has ESRD and makes minimal urine with incontinent.  Has been noting dysuria for the past few days.  Denies nausea, vomiting or diarrhea.  Thinks stool might have been dark.  He was hospitalized in 06/2021 with acute on chronic anemia in the setting of abdominal pain and dark stool.  Had small bowel enteroscopy with gastric, duodenal and jejunal x2 AVMs, all nonbleeding and all treated with APC.  He was started on octreotide 50 mcg twice daily but appears in recent GI follow-up on 2/10 he has not been taking the octreotide.  Hospitalized again from 1/8 to 1/12 with acute metabolic encephalopathy thought due to [place], underlying dementia and uremia.  Also treated for presumed UTI with Rocephin.  He also had abdominal pain with CT abdomen and pelvis x2 that was unremarkable.  In the ED, he was afebrile and normotensive on room air.  Hemoglobin of 9.6 which has dropped from 10.6 on 2/10.  A month ago this was at 12.6. Platelet also decreased to 138.  Sodium of 142, K of 5.7, creatinine of 7.60, BUN of 50, CBG of 125, lipase mildly elevated 53, AST of 19, ALT of 11.  Fecal occult blood was negative but ED noted grayish-black stool.  ED physician  discussed with GI Dr. Candis Schatz who recommends admission and will see in consultation in the morning.   review of Systems: As mentioned in the history of present illness. All other systems reviewed and are negative. Past Medical History:  Diagnosis Date   Allergy    Alzheimer's disease ([place]) 01/19/2021   Anemia    Arthritis    Cataract    bil cateracts removed   Coronary artery disease    Dementia arising in the senium and presenium ([place])    Diabetes mellitus    Type 2   Diverticulitis    ED (erectile dysfunction)    Elevated homocysteine    ESRD (end stage renal disease) on dialysis ([place]) 03/2015   M-W-F dialysis   GERD (gastroesophageal reflux disease)    pepto    Gout    Hiatal hernia    Hyperlipidemia    Hypertension    Hypothyroidism    PVD (peripheral vascular disease) ([place])    has plastic aorta   Renal insufficiency    Sleep apnea    does not wear c-pap   Past Surgical History:  Procedure Laterality Date   ANGIOPLASTY Right 01/26/2021   Procedure: ANGIOPLASTY RIGHT SUBCLAVIAN VEIN;  Surgeon: Serafina Mitchell, MD;  Location: [facility] OR;  Service: Vascular;  Laterality: Right;   aortobifemoral bypass     AV FISTULA PLACEMENT Left 12/01/2013   Procedure: ARTERIOVENOUS (AV) FISTULA CREATION- LEFT BRACHIOCEPHALIC;  Surgeon: Angelia Mould, MD;  Location: [place];  Service: Vascular;  Laterality: Left;   BASCILIC VEIN TRANSPOSITION Right 07/27/2014   Procedure: BASCILIC VEIN TRANSPOSITION;  Surgeon: Angelia Mould, MD;  Location: [place];  Service: Vascular;  Laterality: Right;   BRAIN SURGERY  07/22/2018   BREAST SURGERY     left - granulomatous mastitis   BURR HOLE Bilateral 07/22/2018   Procedure: BILATERAL BURR HOLES;  Surgeon: Kristeen Miss, MD;  Location: [place];  Service: Neurosurgery;  Laterality: Bilateral;   COLONOSCOPY     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 ([place] HX)  1992   ENTEROSCOPY N/A 06/03/2018   Procedure: ENTEROSCOPY;  Surgeon: Lavena Bullion,  DO;  Location: [facility] ENDOSCOPY;  Service: Gastroenterology;  Laterality: N/A;   ENTEROSCOPY N/A 11/20/2018   Procedure: ENTEROSCOPY;  Surgeon: Rush Landmark Telford Nab., MD;  Location: [place];  Service: Gastroenterology;  Laterality: N/A;   ENTEROSCOPY N/A 12/03/2019   Procedure: ENTEROSCOPY;  Surgeon: Lavena Bullion, DO;  Location: [facility] ENDOSCOPY;  Service: Gastroenterology;  Laterality: N/A;  push enteroscopy   ENTEROSCOPY N/A 07/04/2021   Procedure: ENTEROSCOPY;  Surgeon: Sharyn Creamer, MD;  Location: [facility] ENDOSCOPY;  Service: Gastroenterology;  Laterality: N/A;   EYE SURGERY Bilateral    cataracts   FISTULOGRAM Right 01/26/2021   Procedure: FISTULOGRAM RIGHT;  Surgeon: Serafina Mitchell, MD;  Location: [facility] OR;  Service: Vascular;  Laterality: Right;   HEMODIALYSIS INPATIENT  01/17/2018       HOT HEMOSTASIS N/A 06/03/2018   Procedure: HOT HEMOSTASIS (ARGON PLASMA COAGULATION/BICAP);  Surgeon: Lavena Bullion, DO;  [facility] ENDOSCOPY;  Service: Gastroenterology;  Laterality: N/A;   HOT HEMOSTASIS N/A 11/20/2018   Procedure: HOT HEMOSTASIS (ARGON PLASMA COAGULATION/BICAP);  Surgeon: Irving Copas., MD;  Location: [place];  Service: Gastroenterology;  Laterality: N/A;   HOT HEMOSTASIS N/A 12/03/2019   Procedure: HOT HEMOSTASIS (ARGON PLASMA COAGULATION/BICAP);  Surgeon: Lavena Bullion, DO;  Location: [facility] ENDOSCOPY;  Service: Gastroenterology;  Laterality: N/A;   INTRAMEDULLARY (IM) NAIL INTERTROCHANTERIC Right 11/08/2020   Procedure: INTRAMEDULLARY (IM) NAIL INTERTROCHANTRIC;  Surgeon: Erle Crocker, MD;  Location: [place];  Service: Orthopedics;  Laterality: Right;   REVISION OF ARTERIOVENOUS GORETEX GRAFT Right 09/01/2019   Procedure: REVISION OF ARTERIOVENOUS FISTULA RIGHT ARM;  Surgeon: Serafina Mitchell, MD;  Location: [place];  Service: Vascular;  Laterality: Right;   REVISON OF ARTERIOVENOUS FISTULA Left 02/09/2014   Procedure: REVISON OF LEFT ARTERIOVENOUS FISTULA -  RESECTION OF RENDUNDANT VEIN;  Surgeon: Angelia Mould, MD;  Location: [place];  Service: Vascular;  Laterality: Left;   REVISON OF ARTERIOVENOUS FISTULA Right 01/26/2021   Procedure: REVISON OF ARTERIOVENOUS FISTULA RIGHT;  Surgeon: Serafina Mitchell, MD;  Location: [place];  Service: Vascular;  Laterality: Right;   SBO with lysis adhesions     SHUNTOGRAM Left 04/19/2014   Procedure: FISTULOGRAM;  Surgeon: Angelia Mould, MD;  Location: [facility] CATH LAB;  Service: Cardiovascular;  Laterality: Left;   UNILATERAL UPPER EXTREMEITY ANGIOGRAM N/A 07/12/2014   Procedure: UNILATERAL UPPER Anselmo Rod;  Surgeon: Angelia Mould, MD;  Location: [facility] CATH LAB;  Service: Cardiovascular;  Laterality: N/A;   Social History:  reports that he quit smoking about 27 years ago. His smoking use included cigarettes. He has never used smokeless tobacco. He reports that he does not drink alcohol and does not use drugs.  Allergies  Allergen Reactions   Ambien [Zolpidem Tartrate] Other (See Comments)    Hallucinations and "felt crazy"    Penicillins Rash  and Hives    Has patient had a PCN reaction causing immediate rash, facial/tongue/throat swelling, SOB or lightheadedness with hypotension: Yes Has patient had a PCN reaction causing severe rash involving mucus membranes or skin necrosis: Yes Has patient had a PCN reaction that required hospitalization: No Has patient had a PCN reaction occurring within the last 10 years: No If all of the above answers are "NO", then may proceed with Cephalosporin use.  Other reaction(s): HIVES    Family History  Problem Relation Age of Onset   Aneurysm Mother        brain   Heart disease Father    Stroke Father    Hypertension Father    Diabetes Father    Dementia Neg Hx    Colon cancer Neg Hx    Esophageal cancer Neg Hx    Pancreatic cancer Neg Hx    Prostate cancer Neg Hx    Rectal cancer Neg Hx    Stomach cancer Neg Hx     Prior to Admission  medications   Medication Sig Start Date End Date Taking? Authorizing Provider  acetaminophen (TYLENOL) 325 MG tablet Take 1-2 tablets (325-650 mg total) by mouth every 4 (four) hours as needed for mild pain. 08/01/18  Yes Love, Ivan Anchors, PA-C  albuterol (VENTOLIN HFA) 108 (90 Base) MCG/ACT inhaler Inhale 1 puff into the lungs daily as needed for wheezing or shortness of breath.   Yes [provider]  allopurinol (ZYLOPRIM) 100 MG tablet Take 100 mg by mouth daily.   Yes [provider]  camphor-menthol Timoteo Ace) lotion Apply 1 application topically 4 (four) times daily as needed for itching.    Yes [provider]  Carboxymethylcellulose Sodium (THERATEARS) 0.25 % SOLN Place 1 drop into both eyes daily as needed (for dryness).   Yes [provider]  cetirizine (ZYRTEC) 10 MG tablet Take 10 mg by mouth daily as needed for allergies.   Yes [provider]  clobetasol (TEMOVATE) 0.05 % external solution Apply 1 application topically 2 (two) times daily as needed (scalp irritation.).  04/19/15  Yes [provider]  donepezil (ARICEPT) 10 MG tablet Take 1 tablet (10 mg total) by mouth at bedtime. 09/26/21  Yes Melvenia Beam, MD  FLUOCINOLONE ACETONIDE SCALP 0.01 % OIL Apply 1 application topically daily as needed (scalp irritation.).  04/28/15  Yes [provider]  fluticasone (FLONASE) 50 MCG/ACT nasal spray Place 1 spray into both nostrils daily. Patient taking differently: Place 1 spray into both nostrils daily as needed for allergies. 10/08/14  Yes Mikhail, Velta Addison, DO  hydrocortisone 2.5 % cream Apply 1 application topically 3 (three) times daily as needed (skin irritation (legs & arms)).  01/13/19  Yes [provider]  ketoconazole (NIZORAL) 2 % shampoo Apply 1 application topically 2 (two) times a week. 03/31/15  Yes Baxley, Cresenciano Lick, MD  levothyroxine (SYNTHROID, LEVOTHROID) 50 MCG tablet Take 1 tablet (50 mcg total) by mouth  daily. Patient taking differently: Take 50 mcg by mouth daily before breakfast. 08/06/14  Yes Baxley, Cresenciano Lick, MD  lidocaine (LIDODERM) 5 % Place 1 patch onto the skin daily as needed (back pain). 11/12/19  Yes [provider]  memantine (NAMENDA) 10 MG tablet Take 1 tablet (10 mg total) by mouth 2 (two) times daily. 09/26/21  Yes Melvenia Beam, MD  midodrine (PROAMATINE) 10 MG tablet Take one pill prior to hemodialysis on MWF Patient taking differently: Take 5 mg by mouth See admin instructions. Take  one tablet by mouth prior to hemodialysis on MWF 08/13/18  Yes Love, Ivan Anchors, PA-C  Nutritional Supplements (FEEDING SUPPLEMENT, NEPRO CARB STEADY,) LIQD Take 237 mLs by mouth daily.   Yes [provider]  omeprazole (PRILOSEC) 40 MG capsule Take 1 capsule (40 mg total) by mouth daily. 03/13/18  Yes Esterwood, Amy S, PA-C  sevelamer carbonate (RENVELA) 800 MG tablet Take 1,600-2,400 mg by mouth See admin instructions. Take 2,400 mg by mouth three times a day with meals and 1,600 mg with snacks per spouse   Yes [provider]  simvastatin (ZOCOR) 20 MG tablet Take 20 mg by mouth at bedtime.   Yes [provider]  Teriparatide, Recombinant, (FORTEO) 600 MCG/2.4ML SOPN Inject 20 mcg subcutaneously daily Patient taking differently: Inject 20 mcg into the skin at bedtime. 09/08/21  Yes Elayne Snare, MD  TRADJENTA 5 MG TABS tablet TAKE 1 TABLET DAILY Patient taking differently: Take 5 mg by mouth daily. 03/01/20  Yes Elayne Snare, MD  Blood Glucose Monitoring Suppl (FREESTYLE FREEDOM LITE) w/Device KIT Use to check blood sugar 2 times per day dx code E11.65 10/21/18   Elayne Snare, MD  glucose blood (FREESTYLE LITE) test strip USE AS DIRECTED THREE TIMES DAILY Patient taking differently: Check blood sugar bid 01/16/21   Elayne Snare, MD  Insulin Pen Needle (PEN NEEDLES) 32G X 4 MM MISC 1 each by Does not apply route 3 (three) times daily. USE PEN NEEDLES TO INJECT INSULIN THREE  TIMES DAILY. 11/25/18   Elayne Snare, MD  Lancets (FREESTYLE) lancets Use as instructed to check blood sugar 2 times per day dx code E11.65 02/22/16   Elayne Snare, MD    Physical Exam: Vitals:   11/04/21 1930 11/04/21 2015 11/04/21 2030 11/04/21 2103  BP: 124/83  116/68   Pulse: 67 67 70 73  Resp: 10 11 14 12   Temp:    98.1 F (36.7 C)  TempSrc:    Oral  SpO2: 100% 100% 99% 100%  Weight:    66 kg  Height:    5' 11"  (1.803 m)   Constitutional: NAD, calm, comfortable, elderly male laying flat in bed Eyes: PERRL, lids and conjunctivae normal ENMT: Mucous membranes are moist. Neck: normal, supple Respiratory: clear to auscultation bilaterally, no wheezing, no crackles. Normal respiratory effort. No accessory muscle use.  Cardiovascular: Regular rate and rhythm, no murmurs / rubs / gallops. No extremity edema.  Abdomen: no tenderness, no masses palpated. Bowel sounds positive.  Musculoskeletal: no clubbing / cyanosis. No joint deformity upper and lower extremities.  Skin: no rashes, lesions, ulcers. No induration Neurologic: CN 2-12 grossly intact. Strength 5/5 in all 4.  Psychiatric: Alert and oriented only to self.  Patient agitated and angry at times.  Other times is apologetic. Data Reviewed:  See review in HPI  Assessment and Plan:  Acute on chronic anemia concerning for acute GI bleed Hgb has been downward trending over past month from 12.6 -->10.6(2/10) --> 9.6 today. However FOBT negative - GI Dr. Candis Schatz consulted and will tomorrow for further recommends regarding chronic GI loss -continue PPI  -NPO at midnight   UTI -reports dysuria in the past few days. Start IV Rocephin empiricially -makes minimal urine. Will attempt to obtain UA and urine culture with in and out cath   Abdominal pain  Could be due to new AVM bleed and UTI -Tx as above  Hyperkalemia K of 5.7. Give Lokelma x 1.   Thrombocytopenia Platelet of 138.  Likely secondary  to chronic GI bleed.  ESRD  on HD MWF -creatinine stable at 7.6. No missed HD. -needs nephrology consult for dialysis monday  Alzheimer's dementia Continue memantine and donezepil    Advance Care Planning:   Code Status: Full Code   Consults: GI  Family Communication: No family at bedside   Severity of Illness: The appropriate patient status for this patient is INPATIENT. Inpatient status is judged to be reasonable and necessary in order to provide the required intensity of service to ensure the patient's safety. The patient's presenting symptoms, physical exam findings, and initial radiographic and laboratory data in the context of their chronic comorbidities is felt to place them at high risk for further clinical deterioration. Furthermore, it is not anticipated that the patient will be medically stable for discharge from the hospital within 2 midnights of admission.   * I certify that at the point of admission it is my clinical judgment that the patient will require inpatient hospital care spanning beyond 2 midnights from the point of admission due to high intensity of service, high risk for further deterioration and high frequency of surveillance required.*  Author: Orene Desanctis, DO 11/04/2021 9:14 PM  For on call review [email or web address].

## 2021-11-04 NOTE — ED Provider Notes (Signed)
[facility] EMERGENCY DEPARTMENT Provider Note   CSN: [id number] Arrival date & time: 11/04/21  1505     History  Chief Complaint  Patient presents with   Abdominal Pain    [name] is a 81 y.o. male.   Abdominal Pain  This patient is an 81 year old male, he is on dialysis, currently using his right upper extremity fistula.  He also has a history of gout, dementia, diabetes, hypothyroidism and low blood pressure for which she takes midodrine.  He does take Tradjenta.  The patient presents to the hospital with a complaint of some abdominal discomfort which seems to be in the lower left abdomen.  There is also a complaint of possibly having rectal or buttock pain in the triage notes though the patient reports to me that the pain is in the left lower left mid and left flank.  He does not have any pain in the rectum.  There is no fevers no chills no vomiting, he denies diarrhea, denies swelling of the legs, he does endorse having some dark-colored stools overnight.  The patient did get dialysis yesterday, he dialyzes Monday Wednesday and Friday.  Additional history from wife - "having severe pain in the back to the groin" - Left side - she feels he looks swollen.  She has been checking his stools which seem to be getting darker - was in the hospital  last month, - pain has been going on for a bout a month  waking up with pain - his PCP told him to come in to the ED today.  Was told that his Hgb had drifted downwards.  When checked in the office yesterday.    Dialysis: M,W,F - goes to [facility] on [address] - Nephrologist - Dr. Joelyn Oms.    GI doctor - is  GI - Dr. Bryan Lemma -notes from the clinic from yesterday report that they wanted to restart octreotide 20 mg monthly and they were unclear why this had been stopped in the past given his chronic gastrointestinal losses  Home Medications Prior to Admission medications   Medication Sig Start Date End Date  Taking? Authorizing Provider  acetaminophen (TYLENOL) 325 MG tablet Take 1-2 tablets (325-650 mg total) by mouth every 4 (four) hours as needed for mild pain. 08/01/18   Love, Ivan Anchors, PA-C  albuterol (VENTOLIN HFA) 108 (90 Base) MCG/ACT inhaler Inhale 1 puff into the lungs daily as needed for wheezing or shortness of breath.    [provider]  allopurinol (ZYLOPRIM) 100 MG tablet Take 100 mg by mouth daily.    [provider]  Blood Glucose Monitoring Suppl (FREESTYLE FREEDOM LITE) w/Device KIT Use to check blood sugar 2 times per day dx code E11.65 10/21/18   Elayne Snare, MD  camphor-menthol [facility]) lotion Apply 1 application topically 4 (four) times daily as needed for itching.     [provider]  Carboxymethylcellulose Sodium (THERATEARS) 0.25 % SOLN Place 1 drop into both eyes as needed (for dryness).    [provider]  cetirizine (ZYRTEC) 10 MG tablet Take 10 mg by mouth daily as needed for allergies.    [provider]  clobetasol (TEMOVATE) 0.05 % external solution Apply 1 application topically 2 (two) times daily as needed (scalp irritation.).  04/19/15   [provider]  donepezil (ARICEPT) 10 MG tablet Take 1 tablet (10 mg total) by mouth at bedtime. 09/26/21   Melvenia Beam, MD  FLUOCINOLONE ACETONIDE SCALP 0.01 % OIL  Apply 1 application topically daily as needed (scalp irritation.).  04/28/15   [provider]  fluticasone (FLONASE) 50 MCG/ACT nasal spray Place 1 spray into both nostrils daily. Patient taking differently: Place 1 spray into both nostrils daily as needed for allergies. 10/08/14   Mikhail, Velta Addison, DO  glucose blood (FREESTYLE LITE) test strip USE AS DIRECTED THREE TIMES DAILY Patient taking differently: Check blood sugar bid 01/16/21   Elayne Snare, MD  hydrocortisone 2.5 % cream Apply 1 application topically 3 (three) times daily as needed (skin irritation (legs & arms)).  01/13/19   [provider]   Insulin Pen Needle (PEN NEEDLES) 32G X 4 MM MISC 1 each by Does not apply route 3 (three) times daily. USE PEN NEEDLES TO INJECT INSULIN THREE TIMES DAILY. 11/25/18   Elayne Snare, MD  ketoconazole (NIZORAL) 2 % shampoo Apply 1 application topically 2 (two) times a week. 03/31/15   Elby Showers, MD  Lancets (FREESTYLE) lancets Use as instructed to check blood sugar 2 times per day dx code E11.65 02/22/16   Elayne Snare, MD  levothyroxine (SYNTHROID, LEVOTHROID) 50 MCG tablet Take 1 tablet (50 mcg total) by mouth daily. Patient taking differently: Take 50 mcg by mouth daily before breakfast. 08/06/14   Baxley, Cresenciano Lick, MD  lidocaine (LIDODERM) 5 % Place 1 patch onto the skin daily as needed (back pain). 11/12/19   [provider]  memantine (NAMENDA) 10 MG tablet Take 1 tablet (10 mg total) by mouth 2 (two) times daily. 09/26/21   Melvenia Beam, MD  midodrine (PROAMATINE) 10 MG tablet Take one pill prior to hemodialysis on MWF Patient taking differently: Take 5 mg by mouth See admin instructions. Take one tablet by mouth prior to hemodialysis on MWF 08/13/18   Love, Ivan Anchors, PA-C  Nutritional Supplements (FEEDING SUPPLEMENT, NEPRO CARB STEADY,) LIQD Take 237 mLs by mouth daily.    [provider]  omeprazole (PRILOSEC) 40 MG capsule Take 1 capsule (40 mg total) by mouth daily. 03/13/18   Esterwood, Amy S, PA-C  sevelamer carbonate (RENVELA) 800 MG tablet Take 1,600-2,400 mg by mouth See admin instructions. Take 2,400 mg by mouth three times a day with meals and 1,600 mg with snacks on Sun/Tues/Thurs/Sat and 2,400 mg with breakfast and dinner and nothing with snacks on Mon/Wed/Fri    [provider]  simvastatin (ZOCOR) 20 MG tablet Take 20 mg by mouth at bedtime.    [provider]  Teriparatide, Recombinant, (FORTEO) 600 MCG/2.4ML SOPN Inject 20 mcg subcutaneously daily Patient taking differently: Inject 20 mcg into the skin at bedtime. 09/08/21   Elayne Snare, MD   TRADJENTA 5 MG TABS tablet TAKE 1 TABLET DAILY Patient taking differently: Take 5 mg by mouth daily. 03/01/20   Elayne Snare, MD      Allergies    Ambien [zolpidem tartrate] and Penicillins    Review of Systems   Review of Systems  Unable to perform ROS: Dementia  Gastrointestinal:  Positive for abdominal pain.   Physical Exam Updated Vital Signs BP (!) 113/51    Pulse 69    Temp 98.1 F (36.7 C) (Oral)    Resp 13    SpO2 97%  Physical Exam Vitals and nursing note reviewed.  Constitutional:      General: He is not in acute distress.    Appearance: He is well-developed. He is not ill-appearing or diaphoretic.  HENT:     Head: Normocephalic and atraumatic.  Nose: Nose normal. No congestion or rhinorrhea.     Mouth/Throat:     Mouth: Mucous membranes are moist.     Pharynx: No oropharyngeal exudate.  Eyes:     General: No scleral icterus.       Right eye: No discharge.        Left eye: No discharge.     Conjunctiva/sclera: Conjunctivae normal.     Pupils: Pupils are equal, round, and reactive to light.  Neck:     Thyroid: No thyromegaly.     Vascular: No JVD.  Cardiovascular:     Rate and Rhythm: Normal rate and regular rhythm.     Heart sounds: Normal heart sounds. No murmur heard.   No friction rub. No gallop.     Comments: There is a good thrill in both the right and left upper extremity fistulas Pulmonary:     Effort: Pulmonary effort is normal. No respiratory distress.     Breath sounds: Normal breath sounds. No wheezing or rales.  Abdominal:     General: Bowel sounds are normal. There is no distension.     Palpations: Abdomen is soft. There is no mass.     Tenderness: There is abdominal tenderness.     Comments: The patient does have some tenderness in the left abdomen but frankly no matter where I touch him he states that it hurts, he is extremely soft without any focal areas that are more tender than others and there is absolutely no guarding.  There is no  hepatosplenomegaly.  There is no pulsating mass in the abdomen  Genitourinary:    Comments: There is mild left-sided CVA tenderness Musculoskeletal:        General: No tenderness. Normal range of motion.     Cervical back: Normal range of motion and neck supple.     Right lower leg: No edema.     Left lower leg: No edema.  Lymphadenopathy:     Cervical: No cervical adenopathy.  Skin:    General: Skin is warm and dry.     Findings: No erythema or rash.     Comments: There is no rash on the skin of the left side of his abdomen or flank  Neurological:     Mental Status: He is alert.     Coordination: Coordination normal.     Comments: The patient is awake alert and able to move all 4 extremities, he answers questions, he seems to be oriented to date and circumstances as well as name and birthdate  Psychiatric:        Behavior: Behavior normal.    ED Results / Procedures / Treatments   Labs (all labs ordered are listed, but only abnormal results are displayed) Labs Reviewed  CBC WITH DIFFERENTIAL/PLATELET - Abnormal; Notable for the following components:      Result Value   RBC 2.86 (*)    Hemoglobin 9.6 (*)    HCT 30.8 (*)    MCV 107.7 (*)    Platelets 138 (*)    All other components within normal limits  COMPREHENSIVE METABOLIC PANEL - Abnormal; Notable for the following components:   Potassium 5.7 (*)    Glucose, Bld 125 (*)    BUN 50 (*)    Creatinine, Ser 7.60 (*)    Total Protein 5.9 (*)    Albumin 3.1 (*)    Alkaline Phosphatase 229 (*)    Total Bilirubin 0.1 (*)    GFR, Estimated 7 (*)  All other components within normal limits  LIPASE, BLOOD - Abnormal; Notable for the following components:   Lipase 53 (*)    All other components within normal limits  POC OCCULT BLOOD, ED    EKG EKG Interpretation  Date/Time:  Saturday November 04 2021 15:12:56 EST Ventricular Rate:  65 PR Interval:  221 QRS Duration: 88 QT Interval:  418 QTC Calculation: 435 R  Axis:   -24 Text Interpretation: Sinus or ectopic atrial rhythm Ventricular premature complex Prolonged PR interval Borderline left axis deviation Low voltage, precordial leads Probable anteroseptal infarct, old Confirmed by Noemi Chapel [phone]) on 11/04/2021 3:22:06 PM  Radiology No results found.  Procedures Procedures    Medications Ordered in ED Medications  morphine (PF) 4 MG/ML injection 2 mg (has no administration in time range)    ED Course/ Medical Decision Making/ A&P                           Medical Decision Making Amount and/or Complexity of Data Reviewed Labs: ordered.   This patient presents to the ED for concern of abodminal pain, this involves an extensive number of treatment options, and is a complaint that carries with it a high risk of complications and morbidity.  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  There were some nonbleeding internal hemorrhoids as well   Lab Tests:  I Ordered, and personally interpreted labs.  The pertinent results include: Worsening hemoglobin, dropped  another gram, Hemoccult negative, metabolic panel somewhat reassuring    Cardiac Monitoring:  The patient was maintained on a cardiac monitor.  I personally viewed and interpreted the cardiac monitored which showed an underlying rhythm of: Normal sinus rhythm   Medicines ordered and prescription drug management:  I ordered medication including some pain medication, for abdominal pain Reevaluation of the patient after these medicines showed that the patient improved I have reviewed the patients home medicines and have made adjustments as needed   Test Considered:  Repeat CT scan of the abdomen and pelvis but this seems unnecessary given recent work-ups   Critical Interventions:  Discussion with gastroenterology, Dr. Candis Schatz who agrees to consult regarding what to do next with chronic GI losses, discussed with hospitalist as well, patient will need to be admitted   Consultations Obtained:  I requested consultation with the gastroenterology and hospitalist,  and discussed lab and imaging findings as well as pertinent plan - they recommend: Admission to the hospital   Problem List / ED Course:  GI bleeding, source is not exactly clear, Hemoccult is negative but with ongoing losses will need to be admitted   Reevaluation:  After the interventions noted above, I reevaluated the patient and found that they have : Improved  Social Determinants of Health:  End-stage renal disease on dialysis   Dispostion:  After consideration of the diagnostic results and the patients response to treatment, I feel that the patent would benefit from admission to the hospital.          Final Clinical Impression(s) / ED Diagnoses Final diagnoses:  Left lower quadrant abdominal pain  Gastrointestinal hemorrhage, unspecified gastrointestinal hemorrhage type      Noemi Chapel, MD 11/04/21 [phone]

## 2021-11-05 ENCOUNTER — Inpatient Hospital Stay (HOSPITAL_COMMUNITY): Payer: Medicare Other | Admitting: Certified Registered"

## 2021-11-05 ENCOUNTER — Encounter (HOSPITAL_COMMUNITY): Payer: Self-pay | Admitting: Family Medicine

## 2021-11-05 ENCOUNTER — Encounter (HOSPITAL_COMMUNITY)
Admission: EM | Disposition: A | Discharge status: Home Health | Payer: Self-pay | Source: Home / Self Care | Attending: Internal Medicine

## 2021-11-05 DIAGNOSIS — K31819 Angiodysplasia of stomach and duodenum without bleeding: Secondary | ICD-10-CM

## 2021-11-05 DIAGNOSIS — N390 Urinary tract infection, site not specified: Secondary | ICD-10-CM

## 2021-11-05 DIAGNOSIS — D500 Iron deficiency anemia secondary to blood loss (chronic): Secondary | ICD-10-CM

## 2021-11-05 DIAGNOSIS — I2510 Atherosclerotic heart disease of native coronary artery without angina pectoris: Secondary | ICD-10-CM

## 2021-11-05 DIAGNOSIS — K3189 Other diseases of stomach and duodenum: Secondary | ICD-10-CM

## 2021-11-05 DIAGNOSIS — L8990 Pressure ulcer of unspecified site, unspecified stage: Secondary | ICD-10-CM | POA: Insufficient documentation

## 2021-11-05 DIAGNOSIS — I10 Essential (primary) hypertension: Secondary | ICD-10-CM

## 2021-11-05 DIAGNOSIS — K922 Gastrointestinal hemorrhage, unspecified: Secondary | ICD-10-CM | POA: Diagnosis not present

## 2021-11-05 DIAGNOSIS — D62 Acute posthemorrhagic anemia: Secondary | ICD-10-CM

## 2021-11-05 DIAGNOSIS — K449 Diaphragmatic hernia without obstruction or gangrene: Secondary | ICD-10-CM

## 2021-11-05 HISTORY — PX: ENTEROSCOPY: SHX5533

## 2021-11-05 LAB — CBC
HCT: 28.4 % — ABNORMAL LOW (ref 39.0–52.0)
Hemoglobin: 9.1 g/dL — ABNORMAL LOW (ref 13.0–17.0)
MCH: 33.2 pg (ref 26.0–34.0)
MCHC: 32 g/dL (ref 30.0–36.0)
MCV: 103.6 fL — ABNORMAL HIGH (ref 80.0–100.0)
Platelets: 147 10*3/uL — ABNORMAL LOW (ref 150–400)
RBC: 2.74 MIL/uL — ABNORMAL LOW (ref 4.22–5.81)
RDW: 15 % (ref 11.5–15.5)
WBC: 7.7 10*3/uL (ref 4.0–10.5)
nRBC: 0 % (ref 0.0–0.2)

## 2021-11-05 LAB — URINALYSIS, ROUTINE W REFLEX MICROSCOPIC
Bilirubin Urine: NEGATIVE
Glucose, UA: NEGATIVE mg/dL
Ketones, ur: NEGATIVE mg/dL
Nitrite: NEGATIVE
Protein, ur: 100 mg/dL — AB
Specific Gravity, Urine: 1.011 (ref 1.005–1.030)
WBC, UA: >50 WBC/hpf — ABNORMAL HIGH (ref 0–5)
pH: 9 — ABNORMAL HIGH (ref 5.0–8.0)

## 2021-11-05 LAB — PROTIME-INR
INR: 1.1 (ref 0.8–1.2)
Prothrombin Time: 13.8 seconds (ref 11.4–15.2)

## 2021-11-05 LAB — BASIC METABOLIC PANEL
Anion gap: 14 (ref 5–15)
BUN: 55 mg/dL — ABNORMAL HIGH (ref 8–23)
CO2: 26 mmol/L (ref 22–32)
Calcium: 9.3 mg/dL (ref 8.9–10.3)
Chloride: 99 mmol/L (ref 98–111)
Creatinine, Ser: 8.53 mg/dL — ABNORMAL HIGH (ref 0.61–1.24)
GFR, Estimated: 6 mL/min — ABNORMAL LOW (ref >60)
Glucose, Bld: 87 mg/dL (ref 70–99)
Potassium: 5.2 mmol/L — ABNORMAL HIGH (ref 3.5–5.1)
Sodium: 139 mmol/L (ref 135–145)

## 2021-11-05 LAB — MRSA NEXT GEN BY PCR, NASAL: MRSA by PCR Next Gen: NOT DETECTED

## 2021-11-05 LAB — IRON AND TIBC
Iron: 53 ug/dL (ref 45–182)
Saturation Ratios: 21 % (ref 17.9–39.5)
TIBC: 253 ug/dL (ref 250–450)
UIBC: 200 ug/dL

## 2021-11-05 LAB — FERRITIN: Ferritin: 482 ng/mL — ABNORMAL HIGH (ref 24–336)

## 2021-11-05 SURGERY — ENTEROSCOPY
Anesthesia: Monitor Anesthesia Care

## 2021-11-05 MED ORDER — SEVELAMER CARBONATE 800 MG PO TABS: 1600.0000 mg | ORAL_TABLET | ORAL | Status: DC

## 2021-11-05 MED ORDER — TERIPARATIDE 600 MCG/2.4ML ~~LOC~~ SOPN: 20.0000 ug | PEN_INJECTOR | Freq: Every day | SUBCUTANEOUS | Status: DC

## 2021-11-05 MED ORDER — ACETAMINOPHEN 325 MG PO TABS
325.0000 mg | ORAL_TABLET | ORAL | Status: DC | PRN
  Filled 2021-11-05: qty 2

## 2021-11-05 MED ORDER — MEMANTINE HCL 10 MG PO TABS
10.0000 mg | ORAL_TABLET | Freq: Two times a day (BID) | ORAL | Status: DC
  Administered 2021-11-05 – 2021-11-06 (×4): 10 mg via ORAL
  Filled 2021-11-05 (×4): qty 1

## 2021-11-05 MED ORDER — SODIUM CHLORIDE 0.9 % IV SOLN: INTRAVENOUS | Status: DC

## 2021-11-05 MED ORDER — SIMVASTATIN 20 MG PO TABS
20.0000 mg | ORAL_TABLET | Freq: Every day | ORAL | Status: DC
  Administered 2021-11-05: 20 mg via ORAL
  Filled 2021-11-05: qty 1

## 2021-11-05 MED ORDER — ALTEPLASE 2 MG IJ SOLR: 2.0000 mg | Freq: Once | INTRAMUSCULAR | Status: DC | PRN

## 2021-11-05 MED ORDER — SODIUM CHLORIDE 0.9 % IV SOLN: 100.0000 mL | INTRAVENOUS | Status: DC | PRN

## 2021-11-05 MED ORDER — MIDODRINE HCL 5 MG PO TABS
5.0000 mg | ORAL_TABLET | ORAL | Status: DC
  Administered 2021-11-06: 5 mg via ORAL
  Filled 2021-11-05: qty 1

## 2021-11-05 MED ORDER — SODIUM ZIRCONIUM CYCLOSILICATE 5 G PO PACK
5.0000 g | PACK | Freq: Once | ORAL | Status: AC
  Administered 2021-11-05: 5 g via ORAL
  Filled 2021-11-05 (×3): qty 1

## 2021-11-05 MED ORDER — PENTAFLUOROPROP-TETRAFLUOROETH EX AERO: 1.0000 "application " | INHALATION_SPRAY | CUTANEOUS | Status: DC | PRN

## 2021-11-05 MED ORDER — PROPOFOL 500 MG/50ML IV EMUL
INTRAVENOUS | Status: DC | PRN
Start: 2021-11-05 — End: 2021-11-05
  Administered 2021-11-05: 100 ug/kg/min via INTRAVENOUS

## 2021-11-05 MED ORDER — DARBEPOETIN ALFA 40 MCG/0.4ML IJ SOSY
40.0000 ug | PREFILLED_SYRINGE | Freq: Once | INTRAMUSCULAR | Status: AC
  Administered 2021-11-05: 40 ug via INTRAVENOUS
  Filled 2021-11-05: qty 0.4

## 2021-11-05 MED ORDER — SEVELAMER CARBONATE 800 MG PO TABS: 1600.0000 mg | ORAL_TABLET | Freq: Two times a day (BID) | ORAL | Status: DC | PRN

## 2021-11-05 MED ORDER — LEVOTHYROXINE SODIUM 50 MCG PO TABS
50.0000 ug | ORAL_TABLET | Freq: Every day | ORAL | Status: DC
  Administered 2021-11-05 – 2021-11-06 (×2): 50 ug via ORAL
  Filled 2021-11-05 (×3): qty 1

## 2021-11-05 MED ORDER — SODIUM CHLORIDE 0.9 % IV SOLN
125.0000 mg | INTRAVENOUS | Status: DC
  Filled 2021-11-05 (×2): qty 10

## 2021-11-05 MED ORDER — DONEPEZIL HCL 5 MG PO TABS
10.0000 mg | ORAL_TABLET | Freq: Every day | ORAL | Status: DC
  Administered 2021-11-05: 10 mg via ORAL
  Filled 2021-11-05: qty 2

## 2021-11-05 MED ORDER — HALOPERIDOL LACTATE 5 MG/ML IJ SOLN
2.0000 mg | Freq: Once | INTRAMUSCULAR | Status: AC | PRN
  Administered 2021-11-05: 2 mg via INTRAVENOUS
  Filled 2021-11-05: qty 1

## 2021-11-05 MED ORDER — LIDOCAINE HCL (PF) 1 % IJ SOLN: 5.0000 mL | INTRAMUSCULAR | Status: DC | PRN

## 2021-11-05 MED ORDER — LIDOCAINE-PRILOCAINE 2.5-2.5 % EX CREA
1.0000 "application " | TOPICAL_CREAM | CUTANEOUS | Status: DC | PRN
  Filled 2021-11-05: qty 5

## 2021-11-05 MED ORDER — ALLOPURINOL 100 MG PO TABS
100.0000 mg | ORAL_TABLET | Freq: Every day | ORAL | Status: DC
  Administered 2021-11-05 – 2021-11-06 (×2): 100 mg via ORAL
  Filled 2021-11-05 (×2): qty 1

## 2021-11-05 MED ORDER — PROPOFOL 10 MG/ML IV BOLUS
INTRAVENOUS | Status: DC | PRN
Start: 2021-11-05 — End: 2021-11-05
  Administered 2021-11-05 (×2): 20 mg via INTRAVENOUS
  Administered 2021-11-05: 10 mg via INTRAVENOUS

## 2021-11-05 MED ORDER — HEPARIN SODIUM (PORCINE) 1000 UNIT/ML DIALYSIS
1000.0000 [IU] | INTRAMUSCULAR | Status: DC | PRN
  Filled 2021-11-05: qty 1

## 2021-11-05 MED ORDER — SEVELAMER CARBONATE 800 MG PO TABS
2400.0000 mg | ORAL_TABLET | Freq: Three times a day (TID) | ORAL | Status: DC
  Administered 2021-11-05: 2400 mg via ORAL
  Filled 2021-11-05: qty 3

## 2021-11-05 MED ORDER — SODIUM ZIRCONIUM CYCLOSILICATE 5 G PO PACK
5.0000 g | PACK | Freq: Once | ORAL | Status: AC
  Administered 2021-11-05: 5 g via ORAL
  Filled 2021-11-05: qty 1

## 2021-11-05 NOTE — Progress Notes (Signed)
Patient received from ED. AO x4, forgetful. Vitals stable at the time of arrival. Chg bath completed, connected to tele and CCMD notified. Oriented pt to room and call bell system. [name] was called and notified of pt's location. Call bell within reach. Bed alarm on. Will continue to monitor.

## 2021-11-05 NOTE — Progress Notes (Signed)
Bladder scanned showed 5cc urine. Unable to I/o cath at the moment for urine culture. Will continue to monitor.

## 2021-11-05 NOTE — Plan of Care (Signed)

## 2021-11-05 NOTE — Anesthesia Preprocedure Evaluation (Addendum)
Anesthesia Evaluation  Patient identified by MRN, date of birth, ID band Patient awake    Reviewed: Allergy & Precautions, H&P , NPO status , Patient's Chart, lab work & pertinent test results  Airway Mallampati: II  TM Distance: >3 FB Neck ROM: Full    Dental  (+) Edentulous Upper, Missing, Poor Dentition, Dental Advisory Given   Pulmonary neg pulmonary ROS, shortness of breath, sleep apnea , former smoker,    Pulmonary exam normal breath sounds clear to auscultation       Cardiovascular hypertension, + CAD and + Peripheral Vascular Disease   Rhythm:Regular Rate:Normal + Systolic murmurs Echo 02/3148 1. Left ventricular ejection fraction, by estimation, is 55 to 60%. The left ventricle has normal function. The left ventricle has no regional wall motion abnormalities. There is mild left ventricular hypertrophy. Left ventricular diastolic parameters are consistent with Grade I diastolic dysfunction (impaired relaxation).  2. Right ventricular systolic function is normal. The right ventricular size is normal. There is mildly elevated pulmonary artery systolic pressure. The estimated right ventricular systolic pressure is 70.2 mmHg.  3. The mitral valve is normal in structure. Trivial mitral valve regurgitation. No evidence of mitral stenosis.  4. The aortic valve is tricuspid. Aortic valve regurgitation is not visualized. Mild aortic valve sclerosis is present, with no evidence of aortic valve stenosis.  5. The inferior vena cava is normal in size with greater than 50% respiratory variability, suggesting right atrial pressure of 3 mmHg.    Neuro/Psych  Headaches, PSYCHIATRIC DISORDERS Dementia negative neurological ROS     GI/Hepatic negative GI ROS, Neg liver ROS, hiatal hernia, GERD  ,  Endo/Other  negative endocrine ROSdiabetes, Type 2Hypothyroidism   Renal/GU Renal InsufficiencyRenal diseasenegative Renal ROS      Musculoskeletal negative musculoskeletal ROS (+) Arthritis ,   Abdominal   Peds  Hematology  (+) Blood dyscrasia, anemia ,   Anesthesia Other Findings   Reproductive/Obstetrics                            Anesthesia Physical  Anesthesia Plan  ASA: 3  Anesthesia Plan: MAC   Post-op Pain Management:    Induction: Intravenous  PONV Risk Score and Plan: 1 and Ondansetron, Propofol infusion and Treatment may vary due to age or medical condition  Airway Management Planned: Nasal Cannula  Additional Equipment:   Intra-op Plan:   Post-operative Plan:   Informed Consent: I have reviewed the patients History and Physical, chart, labs and discussed the procedure including the risks, benefits and alternatives for the proposed anesthesia with the patient or authorized representative who has indicated his/her understanding and acceptance.     Dental advisory given  Plan Discussed with: CRNA  Anesthesia Plan Comments:        Anesthesia Quick Evaluation

## 2021-11-05 NOTE — Progress Notes (Signed)
Progress Note    [name]  ERX:[id number] DOB: 1941-03-03  DOA: 11/04/2021 PCP: Elby Showers, MD    Brief Narrative:     Medical records reviewed and are as summarized below:  [name] is an 81 y.o. male with medical history significant of ESRD on HD MWF, Alzheimer's disease, hypothyroidism, type 2 diabetes, history of AAA repair, subdural hematoma evacuation, history of GI bleed with AVMs who presents with concerns of severe left-sided abdominal pain.    Assessment/Plan:   Principal Problem:   Acute GI bleeding Active Problems:   End stage renal disease (HCC)   Thrombocytopenia (HCC)   GIB (gastrointestinal bleeding)   Anemia   Alzheimer's disease, unspecified (CODE) (HCC)   UTI (urinary tract infection)   Pressure injury of skin   Acute blood loss anemia  Acute on chronic anemia concerning for acute GI bleed Hgb has been downward trending over past month from 12.6 -->10.6(2/10) --> 9.6 today. However FOBT negative - s/p EGD: enteroscopy was unremarkable.   No active bleeding or evidence of recent bleeding.  No AVMs encountered, surprisingly.    - recommend PO diet and observation overnight.   -If stable hgb, then should be able to go home.   -would benefit from octreotide injections (being set up outpatient)   UTI -reports dysuria in the past few days. Start IV Rocephin empiricially -IV abx while in hospital -last culture showed multiple species   Abdominal pain  -appears resolved?  Unable to reproduce today   Hyperkalemia -labs in AM -lokelma again today   Thrombocytopenia Recheck labs in AM   ESRD on HD MWF -creatinine stable at 7.6. No missed HD. -nephrology consult   Alzheimer's dementia Continue memantine and donezepil -delirium precautions   Family Communication/Anticipated D/C date and plan/Code Status    Code Status: Full Code.  Family Communication: noone at bedside Disposition Plan: Status is: Inpatient Remains inpatient  appropriate because: ? Home in AM    Medical Consultants:   GI renal    Subjective:   Says he is going home as no one has seen him  Objective:    Vitals:   11/05/21 1330 11/05/21 1340 11/05/21 1350 11/05/21 1412  BP: (!) 97/33 (!) 101/40 (!) 137/39 111/79  Pulse: 72 64 63 64  Resp: 16 17 16 13   Temp: (!) 97.5 F (36.4 C)   97.7 F (36.5 C)  TempSrc: Temporal   Oral  SpO2: 96% 99% 96% 99%  Weight:      Height:        Intake/Output Summary (Last 24 hours) at 11/05/2021 1455 Last data filed at 11/05/2021 1325 Gross per 24 hour  Intake 800 ml  Output 0 ml  Net 800 ml   Filed Weights   11/04/21 2103  Weight: 66 kg    Exam:  General: Appearance:    Elderly male     Lungs:     Clear to auscultation bilaterally, respirations unlabored  Heart:    Normal heart rate.   MS:   All extremities are intact.    Neurologic:   Awake, confused, irritable     Data Reviewed:   I have personally reviewed following labs and imaging studies:  Labs: Labs show the following:   Basic Metabolic Panel: Recent Labs  Lab 11/04/21 1519 11/05/21 0637  NA 142 139  K 5.7* 5.2*  CL 99 99  CO2 30 26  GLUCOSE 125* 87  BUN 50* 55*  CREATININE 7.60* 8.53*  CALCIUM 9.1 9.3   GFR Estimated Creatinine Clearance: 6.4 mL/min (A) (by C-G formula based on SCr of 8.53 mg/dL (H)). Liver Function Tests: Recent Labs  Lab 11/04/21 1519  AST 19  ALT 11  ALKPHOS 229*  BILITOT 0.1*  PROT 5.9*  ALBUMIN 3.1*   Recent Labs  Lab 11/04/21 1519  LIPASE 53*   No results for input(s): AMMONIA in the last 168 hours. Coagulation profile Recent Labs  Lab 11/05/21 0637  INR 1.1    CBC: Recent Labs  Lab 11/03/21 1211 11/04/21 1519 11/05/21 0637  WBC 9.2 8.9 7.7  NEUTROABS  --  5.7  --   HGB 10.6* 9.6* 9.1*  HCT 32.2* 30.8* 28.4*  MCV 102.7* 107.7* 103.6*  PLT 169.0 138* 147*   Cardiac Enzymes: No results for input(s): CKTOTAL, CKMB, CKMBINDEX, TROPONINI in the last 168  hours. BNP (last 3 results) No results for input(s): PROBNP in the last 8760 hours. CBG: No results for input(s): GLUCAP in the last 168 hours. D-Dimer: No results for input(s): DDIMER in the last 72 hours. Hgb A1c: No results for input(s): HGBA1C in the last 72 hours. Lipid Profile: No results for input(s): CHOL, HDL, LDLCALC, TRIG, CHOLHDL, LDLDIRECT in the last 72 hours. Thyroid function studies: No results for input(s): TSH, T4TOTAL, T3FREE, THYROIDAB in the last 72 hours.  Invalid input(s): FREET3 Anemia work up: Recent Labs    11/05/21 0637  FERRITIN 482*  TIBC 253  IRON 53   Sepsis Labs: Recent Labs  Lab 11/03/21 1211 11/04/21 1519 11/05/21 0637  WBC 9.2 8.9 7.7    Microbiology Recent Results (from the past 240 hour(s))  MRSA Next Gen by PCR, Nasal     Status: None   Collection Time: 11/05/21  2:06 AM   Specimen: Nasal Mucosa; Nasal Swab  Result Value Ref Range Status   MRSA by PCR Next Gen NOT DETECTED NOT DETECTED Final    Comment: (NOTE) The GeneXpert MRSA Assay (FDA approved for NASAL specimens only), is one component of a comprehensive MRSA colonization surveillance program. It is not intended to diagnose MRSA infection nor to guide or monitor treatment for MRSA infections. Test performance is not FDA approved in patients less than 52 years old. Performed at [facility], [place] [address], [place], [place] [address]     Procedures and diagnostic studies:  No results found.  Medications:    allopurinol  100 mg Oral Daily   donepezil  10 mg Oral QHS   levothyroxine  50 mcg Oral QAC breakfast   memantine  10 mg Oral BID   [START ON 11/06/2021] midodrine  5 mg Oral Q M,W,F   morphine (PF)  2 mg Intravenous Once   sevelamer carbonate  2,400 mg Oral TID WC   simvastatin  20 mg Oral QHS   Teriparatide (Recombinant)  20 mcg Subcutaneous QHS   Continuous Infusions:  cefTRIAXone (ROCEPHIN)  IV 1 g (11/04/21 2346)     LOS: 1 day   Geradine Girt  [facility]   How to contact the [facility] Attending or Consulting provider [place] or covering provider during after hours [place], for this patient?  Check the care team in [facility] and look for a) attending/consulting [facility] provider listed and b) the [facility] team listed Log into [email or web address] and use [place]'s universal password to access. If you do not have the password, please contact the hospital operator. Locate the [facility] provider you are looking for under  [facility] and page to a number that you can be directly reached. If you still have difficulty reaching the provider, please page the [facility] (Director on Call) for the Hospitalists listed on amion for assistance.  11/05/2021, 2:55 PM

## 2021-11-05 NOTE — Anesthesia Postprocedure Evaluation (Signed)
Anesthesia Post Note  Patient: [name]  Procedure(s) Performed: ENTEROSCOPY     Patient location during evaluation: PACU Anesthesia Type: MAC Level of consciousness: awake and alert Pain management: pain level controlled Vital Signs Assessment: post-procedure vital signs reviewed and stable Respiratory status: spontaneous breathing Cardiovascular status: stable Anesthetic complications: no   No notable events documented.  Last Vitals:  Vitals:   11/05/21 1350 11/05/21 1412  BP: (!) 137/39 111/79  Pulse: 63 64  Resp: 16 13  Temp:  36.5 C  SpO2: 96% 99%    Last Pain:  Vitals:   11/05/21 1412  TempSrc: Oral  PainSc:                  [name]

## 2021-11-05 NOTE — Op Note (Signed)
[facility] Patient Name: [name] Procedure Date : 11/05/2021 MRN: [id number] Attending MD: Gladstone Pih. 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 Candis Schatz, MD, Grace Isaac, RN, Cherylynn Ridges, Technician, Silas Flood, CRNA Referring MD:              Medicines:                Monitored Anesthesia Care Complications:            No immediate complications. Estimated Blood Loss:     Estimated blood loss: none. Procedure:                Pre-Anesthesia Assessment:                           - Prior to the procedure, a History and Physical                            was performed, and patient medications and                            allergies were reviewed. The patient's tolerance of                            previous anesthesia was also reviewed. The risks                            and benefits of the procedure and the sedation                            options and risks were discussed with the patient.                            All questions were answered, and informed consent                            was obtained. Prior Anticoagulants: The patient has                            taken no previous anticoagulant or antiplatelet                            agents. ASA Grade Assessment: III - A patient with  severe systemic disease. After reviewing the risks                            and benefits, the patient was deemed in                            satisfactory condition to undergo the procedure.                           After obtaining informed consent, the endoscope was                            passed under direct vision. Throughout the                             procedure, the patient's blood pressure, pulse, and                            oxygen saturations were monitored continuously. The                            [id number]-HQ190L ([id number]) Olympus colonoscope was                            introduced through the mouth and advanced to the                            mid-jejunum. The small bowel enteroscopy was                            accomplished without difficulty. The patient                            tolerated the procedure well. Scope In: Scope Out: Findings:      The examined esophagus was normal.      A small hiatal hernia was present.      The exam of the stomach was otherwise normal.      There was no evidence of significant pathology in the entire examined       duodenum.      There was no evidence of significant pathology in the entire examined       portion of jejunum. Impression:               - Normal esophagus.                           - Small hiatal hernia.                           - Normal examined duodenum.                           - The examined portion of the jejunum was normal.                           - No specimens  collected.                           - No AVMs noted, no evidence of bleeding or recent                            bleeding Recommendation:           - Return patient to hospital ward for ongoing care.                           - Resume previous diet.                           - Bleeding may have been secondary to AVMs deep in                            the small intestine.                           - Given the patient's advanced age and relatively                            mild anemia, would recommend against more                            aggressive interventions such as balloon                            enteroscopy at this time. Recommend medical therapy                            with octreotide, iron supplementation and                            transfusions as needed.                            - Reasonable to observe the patient overnight to                            demonstrate stable hgb.                           - Dr. Marlynn Perking will be assuming inpatient GI                            responsibilities tomorrow. Procedure Code(s):        --- Professional ---                           [phone], Small intestinal endoscopy, enteroscopy                            beyond second portion of duodenum, not including  ileum; diagnostic, including collection of                            specimen(s) by brushing or washing, when performed                            (separate procedure) Diagnosis Code(s):        --- Professional ---                           K44.9, Diaphragmatic hernia without obstruction or                            gangrene                           K31.819, Angiodysplasia of stomach and duodenum                            without bleeding CPT copyright 2019 American [employer] Association. All rights reserved. The codes documented in this report are preliminary and upon coder review may  be revised to meet current compliance requirements. Man Bonneau E. Candis Schatz, MD 11/05/2021 1:36:53 PM This report has been signed electronically. Number of Addenda: 0

## 2021-11-05 NOTE — Consult Note (Signed)
[facility] Renal Consultation Note    Indication for Consultation:  Management of ESRD/hemodialysis; anemia, hypertension/volume and secondary hyperparathyroidism   HPI: [name] is a 81 y.o. male with ESRD on HD MWF, HTN, DM, PAD, dementia, small bowel AVMs, recurrent GI bleeding who is admitted for evaluation of abdominal pain. Family called his GI doctor who recommended admission. Hgb 9.6 >9.1 today, down trending. Underwent small bowel endoscopy per Dr. Candis Schatz. Labs this am Na 139, K 5.2 BUN 55 Cr 8.5 WBC 7.7.  Dialyzes MWF at [facility] via AVF. Last HD on Friday 2/10.  Seen and examined at bedside. Alert, oriented. Trying to eat lunch, but doesn't like the hospital food. Not endorsing abdominal pain currently. Denies cp, sob, n/v.   Past Medical History:  Diagnosis Date   Allergy    Alzheimer's disease ([place]) 01/19/2021   Anemia    Arthritis    Cataract    bil cateracts removed   Coronary artery disease    Dementia arising in the senium and presenium ([place])    Diabetes mellitus    Type 2   Diverticulitis    ED (erectile dysfunction)    Elevated homocysteine    ESRD (end stage renal disease) on dialysis ([place]) 03/2015   M-W-F dialysis   GERD (gastroesophageal reflux disease)    pepto    Gout    Hiatal hernia    Hyperlipidemia    Hypertension    Hypothyroidism    PVD (peripheral vascular disease) ([place])    has plastic aorta   Renal insufficiency    Sleep apnea    does not wear c-pap   Past Surgical History:  Procedure Laterality Date   ANGIOPLASTY Right 01/26/2021   Procedure: ANGIOPLASTY RIGHT SUBCLAVIAN VEIN;  Surgeon: Serafina Mitchell, MD;  Location: [place];  Service: Vascular;  Laterality: Right;   aortobifemoral bypass     AV FISTULA PLACEMENT Left 12/01/2013   Procedure: ARTERIOVENOUS (AV) FISTULA CREATION- LEFT BRACHIOCEPHALIC;  Surgeon: Angelia Mould, MD;  Location: [place];  Service: Vascular;  Laterality: Left;    [place] Right 07/27/2014   Procedure: BASCILIC VEIN TRANSPOSITION;  Surgeon: Angelia Mould, MD;  Location: [place];  Service: Vascular;  Laterality: Right;   BRAIN SURGERY  07/22/2018   BREAST SURGERY     left - granulomatous mastitis   BURR HOLE Bilateral 07/22/2018   Procedure: BILATERAL BURR HOLES;  Surgeon: Kristeen Miss, MD;  Location: [place];  Service: Neurosurgery;  Laterality: Bilateral;   COLONOSCOPY     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 ([place] HX)  1992   ENTEROSCOPY N/A 06/03/2018   Procedure: ENTEROSCOPY;  Surgeon: Lavena Bullion, DO;  Location: [facility] ENDOSCOPY;  Service: Gastroenterology;  Laterality: N/A;   ENTEROSCOPY N/A 11/20/2018   Procedure: ENTEROSCOPY;  Surgeon: Rush Landmark Telford Nab., MD;  Location: [place];  Service: Gastroenterology;  Laterality: N/A;   ENTEROSCOPY N/A 12/03/2019   Procedure: ENTEROSCOPY;  Surgeon: Lavena Bullion, DO;  Location: [facility] ENDOSCOPY;  Service: Gastroenterology;  Laterality: N/A;  push enteroscopy   ENTEROSCOPY N/A 07/04/2021   Procedure: ENTEROSCOPY;  Surgeon: Sharyn Creamer, MD;  Location: [facility] ENDOSCOPY;  Service: Gastroenterology;  Laterality: N/A;   EYE SURGERY Bilateral    cataracts   FISTULOGRAM Right 01/26/2021   Procedure: FISTULOGRAM RIGHT;  Surgeon: Serafina Mitchell, MD;  Location: [place];  Service: Vascular;  Laterality: Right;   HEMODIALYSIS INPATIENT  01/17/2018  HOT HEMOSTASIS N/A 06/03/2018   Procedure: HOT HEMOSTASIS (ARGON PLASMA COAGULATION/BICAP);  Surgeon: Shellia Cleverly, DO;  Location: [facility] ENDOSCOPY;  Service: Gastroenterology;  Laterality: N/A;   HOT HEMOSTASIS N/A 11/20/2018   Procedure: HOT HEMOSTASIS (ARGON PLASMA COAGULATION/BICAP);  Surgeon: Lemar Lofty., MD;  Location: [facility] ENDOSCOPY;  Service: Gastroenterology;  Laterality: N/A;   HOT HEMOSTASIS N/A 12/03/2019   Procedure: HOT HEMOSTASIS (ARGON PLASMA COAGULATION/BICAP);  Surgeon: Shellia Cleverly, DO;  Location: [facility]  ENDOSCOPY;  Service: Gastroenterology;  Laterality: N/A;   INTRAMEDULLARY (IM) NAIL INTERTROCHANTERIC Right 11/08/2020   Procedure: INTRAMEDULLARY (IM) NAIL INTERTROCHANTRIC;  Surgeon: Terance Hart, MD;  Location: [facility] OR;  Service: Orthopedics;  Laterality: Right;   REVISION OF ARTERIOVENOUS GORETEX GRAFT Right 09/01/2019   Procedure: REVISION OF ARTERIOVENOUS FISTULA RIGHT ARM;  Surgeon: Nada Libman, MD;  [facility]: [facility] OR;  Service: Vascular;  Laterality: Right;   REVISON OF ARTERIOVENOUS FISTULA Left 02/09/2014   Procedure: REVISON OF LEFT ARTERIOVENOUS FISTULA - RESECTION OF RENDUNDANT VEIN;  Surgeon: Chuck Hint, MD;  Location: [facility] OR;  Service: Vascular;  Laterality: Left;   REVISON OF ARTERIOVENOUS FISTULA Right 01/26/2021   Procedure: REVISON OF ARTERIOVENOUS FISTULA RIGHT;  Surgeon: Nada Libman, MD;  Location: [facility] OR;  Service: Vascular;  Laterality: Right;   SBO with lysis adhesions     SHUNTOGRAM Left 04/19/2014   Procedure: FISTULOGRAM;  Surgeon: Chuck Hint, MD;  Location: [facility] CATH LAB;  Service: Cardiovascular;  Laterality: Left;   UNILATERAL UPPER EXTREMEITY ANGIOGRAM N/A 07/12/2014   Procedure: UNILATERAL UPPER Salvadore [place];  Surgeon: Chuck Hint, MD;  Location: [facility] CATH LAB;  Service: Cardiovascular;  Laterality: N/A;   Family History  Problem Relation Age of Onset   Aneurysm Mother        brain   Heart disease Father    Stroke Father    Hypertension Father    Diabetes Father    Dementia Neg Hx    Colon cancer Neg Hx    Esophageal cancer Neg Hx    Pancreatic cancer Neg Hx    Prostate cancer Neg Hx    Rectal cancer Neg Hx    Stomach cancer Neg Hx    Social History:  reports that he quit smoking about 27 years ago. His smoking use included cigarettes. He has never used smokeless tobacco. He reports that he does not drink alcohol and does not use drugs. Allergies  Allergen Reactions   Ambien [Zolpidem Tartrate] Other (See  Comments)    Hallucinations and "felt crazy"    Penicillins Rash and Hives    Has patient had a PCN reaction causing immediate rash, facial/tongue/throat swelling, SOB or lightheadedness with hypotension: Yes Has patient had a PCN reaction causing severe rash involving mucus membranes or skin necrosis: Yes Has patient had a PCN reaction that required hospitalization: No Has patient had a PCN reaction occurring within the last 10 years: No If all of the above answers are "NO", then may proceed with Cephalosporin use.  Other reaction(s): HIVES   Prior to Admission medications   Medication Sig Start Date End Date Taking? Authorizing Provider  acetaminophen (TYLENOL) 325 MG tablet Take 1-2 tablets (325-650 mg total) by mouth every 4 (four) hours as needed for mild pain. 08/01/18  Yes Love, Evlyn Kanner, PA-C  albuterol (VENTOLIN HFA) 108 (90 Base) MCG/ACT inhaler Inhale 1 puff into the lungs daily as needed for wheezing or shortness of breath.   Yes [provider]  allopurinol (ZYLOPRIM) 100 MG tablet Take 100 mg by mouth daily.   Yes [provider]  camphor-menthol Timoteo Ace) lotion Apply 1 application topically 4 (four) times daily as needed for itching.    Yes [provider]  Carboxymethylcellulose Sodium (THERATEARS) 0.25 % SOLN Place 1 drop into both eyes daily as needed (for dryness).   Yes [provider]  cetirizine (ZYRTEC) 10 MG tablet Take 10 mg by mouth daily as needed for allergies.   Yes [provider]  clobetasol (TEMOVATE) 0.05 % external solution Apply 1 application topically 2 (two) times daily as needed (scalp irritation.).  04/19/15  Yes [provider]  donepezil (ARICEPT) 10 MG tablet Take 1 tablet (10 mg total) by mouth at bedtime. 09/26/21  Yes Melvenia Beam, MD  FLUOCINOLONE ACETONIDE SCALP 0.01 % OIL Apply 1 application topically daily as needed (scalp irritation.).  04/28/15  Yes [provider]  fluticasone  (FLONASE) 50 MCG/ACT nasal spray Place 1 spray into both nostrils daily. Patient taking differently: Place 1 spray into both nostrils daily as needed for allergies. 10/08/14  Yes Mikhail, Velta Addison, DO  hydrocortisone 2.5 % cream Apply 1 application topically 3 (three) times daily as needed (skin irritation (legs & arms)).  01/13/19  Yes [provider]  ketoconazole (NIZORAL) 2 % shampoo Apply 1 application topically 2 (two) times a week. 03/31/15  Yes Baxley, Cresenciano Lick, MD  levothyroxine (SYNTHROID, LEVOTHROID) 50 MCG tablet Take 1 tablet (50 mcg total) by mouth daily. Patient taking differently: Take 50 mcg by mouth daily before breakfast. 08/06/14  Yes Baxley, Cresenciano Lick, MD  lidocaine (LIDODERM) 5 % Place 1 patch onto the skin daily as needed (back pain). 11/12/19  Yes [provider]  memantine (NAMENDA) 10 MG tablet Take 1 tablet (10 mg total) by mouth 2 (two) times daily. 09/26/21  Yes Melvenia Beam, MD  midodrine (PROAMATINE) 10 MG tablet Take one pill prior to hemodialysis on MWF Patient taking differently: Take 5 mg by mouth See admin instructions. Take one tablet by mouth prior to hemodialysis on MWF 08/13/18  Yes Love, Ivan Anchors, PA-C  Nutritional Supplements (FEEDING SUPPLEMENT, NEPRO CARB STEADY,) LIQD Take 237 mLs by mouth daily.   Yes [provider]  omeprazole (PRILOSEC) 40 MG capsule Take 1 capsule (40 mg total) by mouth daily. 03/13/18  Yes Esterwood, Amy S, PA-C  sevelamer carbonate (RENVELA) 800 MG tablet Take 1,600-2,400 mg by mouth See admin instructions. Take 2,400 mg by mouth three times a day with meals and 1,600 mg with snacks per spouse   Yes [provider]  simvastatin (ZOCOR) 20 MG tablet Take 20 mg by mouth at bedtime.   Yes [provider]  Teriparatide, Recombinant, (FORTEO) 600 MCG/2.4ML SOPN Inject 20 mcg subcutaneously daily Patient taking differently: Inject 20 mcg into the skin at bedtime. 09/08/21  Yes Elayne Snare, MD  TRADJENTA  5 MG TABS tablet TAKE 1 TABLET DAILY Patient taking differently: Take 5 mg by mouth daily. 03/01/20  Yes Elayne Snare, MD  Blood Glucose Monitoring Suppl (FREESTYLE FREEDOM LITE) w/Device KIT Use to check blood sugar 2 times per day dx code E11.65 10/21/18   Elayne Snare, MD  glucose blood (FREESTYLE LITE) test strip USE AS DIRECTED THREE TIMES DAILY Patient taking differently: Check blood sugar bid 01/16/21   Elayne Snare, MD  Insulin Pen Needle (PEN NEEDLES) 32G X 4 MM MISC 1 each by Does not apply route 3 (three) times daily. USE PEN NEEDLES  TO INJECT INSULIN THREE TIMES DAILY. 11/25/18   Elayne Snare, MD  Lancets (FREESTYLE) lancets Use as instructed to check blood sugar 2 times per day dx code E11.65 02/22/16   Elayne Snare, MD   Current Facility-Administered Medications  Medication Dose Route Frequency Provider Last Rate Last Admin   0.9 %  sodium chloride infusion   Intravenous Continuous Daryel November, MD 20 mL/hr at 11/05/21 1238 Restarted at 11/05/21 1256   [MAR Hold] acetaminophen (TYLENOL) tablet 325-650 mg  325-650 mg Oral Q4H PRN Tu, Ching T, DO       [MAR Hold] allopurinol (ZYLOPRIM) tablet 100 mg  100 mg Oral Daily Tu, Ching T, DO   100 mg at 11/05/21 0824   [MAR Hold] cefTRIAXone (ROCEPHIN) 1 g in sodium chloride 0.9 % 100 mL IVPB  1 g Intravenous Q24H Tu, Ching T, DO 200 mL/hr at 11/04/21 2346 1 g at 11/04/21 2346   [MAR Hold] donepezil (ARICEPT) tablet 10 mg  10 mg Oral QHS Tu, Ching T, DO       [MAR Hold] levothyroxine (SYNTHROID) tablet 50 mcg  50 mcg Oral QAC breakfast Tu, Ching T, DO   50 mcg at 11/05/21 1610   [facility] Hold] melatonin tablet 3 mg  3 mg Oral QHS PRN Opyd, Ilene Qua, MD   3 mg at 11/04/21 2338   [MAR Hold] memantine (NAMENDA) tablet 10 mg  10 mg Oral BID Tu, Ching T, DO   10 mg at 11/05/21 0824   [MAR Hold] midodrine (PROAMATINE) tablet 5 mg  5 mg Oral Q M,W,F Tu, Ching T, DO       [MAR Hold] morphine (PF) 4 MG/ML injection 2 mg  2 mg Intravenous Once Noemi Chapel, MD        [facility] Hold] sevelamer carbonate (RENVELA) tablet 1,600 mg  1,600 mg Oral BID BM PRN Tu, Ching T, DO       [MAR Hold] sevelamer carbonate (RENVELA) tablet 2,400 mg  2,400 mg Oral TID WC Tu, Ching T, DO       [MAR Hold] simvastatin (ZOCOR) tablet 20 mg  20 mg Oral QHS Tu, Ching T, DO       [MAR Hold] Teriparatide (Recombinant) SOPN 20 mcg  20 mcg Subcutaneous QHS Tu, Ching T, DO         ROS: As per HPI otherwise negative.  Physical Exam: Vitals:   11/05/21 1227 11/05/21 1330 11/05/21 1340 11/05/21 1350  BP: (!) 173/67 (!) 97/33 (!) 101/40 (!) 137/39  Pulse: 66 72 64 63  Resp: _0 Temp: (!) 97.4 F (36.3 C) (!) 97.5 F (36.4 C)    TempSrc: Temporal Temporal    SpO2: 100% 96% 99% 96%  Weight:      Height:         General: Appears comfortable, in no distress  Head: NCAT sclera not icteric MMM Neck: Supple. No JVD appreciated  Lungs: Clear bilaterally without wheezes, rales, or rhonchi. Normal WOB  Heart: RRR, no murmur, rub, or gallop  Abdomen: soft non-tender, bowel sounds normal, no masses  Lower extremities:without edema or ischemic changes, no open wounds  Neuro: A & O X 3. Moves all extremities spontaneously. Psych:  Responds to questions appropriately with a normal affect. Dialysis Access: RUE AVF +bruit   Labs: Basic Metabolic Panel: Recent Labs  Lab 11/04/21 1519 11/05/21 0637  NA 142 139  K 5.7* 5.2*  CL 99 99  CO2 30 26  GLUCOSE 125*  87  BUN 50* 55*  CREATININE 7.60* 8.53*  CALCIUM 9.1 9.3   Liver Function Tests: Recent Labs  Lab 11/04/21 1519  AST 19  ALT 11  ALKPHOS 229*  BILITOT 0.1*  PROT 5.9*  ALBUMIN 3.1*   Recent Labs  Lab 11/04/21 1519  LIPASE 53*   No results for input(s): AMMONIA in the last 168 hours. CBC: Recent Labs  Lab 11/03/21 1211 11/04/21 1519 11/05/21 0637  WBC 9.2 8.9 7.7  NEUTROABS  --  5.7  --   HGB 10.6* 9.6* 9.1*  HCT 32.2* 30.8* 28.4*  MCV 102.7* 107.7* 103.6*  PLT 169.0 138* 147*   Cardiac  Enzymes: No results for input(s): CKTOTAL, CKMB, CKMBINDEX, TROPONINI in the last 168 hours. CBG: No results for input(s): GLUCAP in the last 168 hours. Iron Studies:  Recent Labs    11/05/21 0637  IRON 53  TIBC 253  FERRITIN 482*   Studies/Results: No results found.  Dialysis Orders:  Unit: [place] MWF  Time: 4:00  EDW:  64.5 kg  Flows: 400/ A1.5x  [place]: 2K/2Ca  Access: AVF  Heparin: None ESA: None  VDRA: Hectorol 3 TIW   Assessment/Plan: ESRD - HD MWF. Continue on schedule. Next HD Monday. 1st shift.  Abdominal pain/Recurrent GIB. Hgb trending down. GI following -for small bowel endoscopy today.  Hypertension/volume  - Blood pressure acceptable. No gross volume on exam. UF 2L with HD.  Anemia  - Hgb 9.1 Tsat 21. Start IV Fe bolus here + ESA  Metabolic bone disease -  Calcium in range. Continue Hectorol/Renvela binder Nutrition - Renal diet/fluid restriction Dementia - on Aricept.   Lynnda Child PA-C [facility] 11/05/2021, 2:01 PM

## 2021-11-05 NOTE — Anesthesia Procedure Notes (Signed)
Procedure Name: MAC Date/Time: 11/05/2021 1:07 PM Performed by: Amadeo Garnet, CRNA Pre-anesthesia Checklist: Patient identified, Emergency Drugs available, Suction available and Patient being monitored Patient Re-evaluated:Patient Re-evaluated prior to induction Oxygen Delivery Method: Nasal cannula Preoxygenation: Pre-oxygenation with 100% oxygen Induction Type: IV induction Placement Confirmation: positive ETCO2 Dental Injury: Teeth and Oropharynx as per pre-operative assessment

## 2021-11-05 NOTE — Transfer of Care (Signed)
Immediate Anesthesia Transfer of Care Note  Patient: [name]  Procedure(s) Performed: ENTEROSCOPY  Patient Location: Endoscopy Unit  Anesthesia Type:MAC  Level of Consciousness: drowsy and patient cooperative  Airway & Oxygen Therapy: Patient Spontanous Breathing and Patient connected to nasal cannula oxygen  Post-op Assessment: Report given to RN, Post -op Vital signs reviewed and stable and Patient moving all extremities  Post vital signs: Reviewed and stable  Last Vitals:  Vitals Value Taken Time  BP 97/33 11/05/21 1330  Temp 36.4 C 11/05/21 1330  Pulse 72 11/05/21 1330  Resp 16 11/05/21 1330  SpO2 96 % 11/05/21 1330    Last Pain:  Vitals:   11/05/21 1330  TempSrc: Temporal  PainSc: Asleep      Patients Stated Pain Goal: 0 (77/82/42 [id number])  Complications: No notable events documented.

## 2021-11-05 NOTE — Progress Notes (Signed)
Pt came back to rm 4 from endo. Reinitiated tele. VSS. Bed alarm on . Call bell within reach.   Lavenia Atlas, RN

## 2021-11-05 NOTE — Consult Note (Addendum)
Referring Provider: Dr. Ileene Musa Primary Care Physician:  Elby Showers, MD Primary Gastroenterologist:  Dr.Cirigliano  Reason for Consultation:  Abdominal pain, anemia   HPI: [name] is a 81 y.o. 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 S/P PTCA right subclavian vein 01/2021. Subdural hematoma s/p evacuation 2019.   He is followed by Dr. Bryan Lemma since 2019 with history of chronic GI blood loss with anemia from gastric and small bowel AVMs. He was hospitalized in 06/2021 with acute on chronic anemia in the setting of abdominal pain and dark stools.  Small bowel enteroscopy with gastric, duodenal, and jejunal x 2 AVMs, all nonbleeding, all treated with APC. He was started on Octreotide 50 mcg twice daily at that time.   He was admitted to the hospital 1/8 - 10/05/2021 with acute metabolic encephalopathy, thought to be due to recently starting Korsuva, also complicated by underlying dementia, uremia (BUN 63), possible UTI. Ammonia 37.  He was dialyzed, treated with Rocephin for presumed UTI with improvement in mentation back to baseline. Hypernatremia improved with HD.  He had abdominal pain during this hospitalization with unremarkable CTAP wo contrast 1/8 and CTAP with contrast 10/05/2021.   He is followed by Palliative care and recently seen by the [facility].   He is wife contacted our on call service yesterday reported the patient was having severe LLQ pain which radiated to his left groin and across to his RLQ. Labs done during his office visit with Dr. Bryan Lemma 11/03/2021 showed a Hg level of 10.6 down from a Hg level of 12.6 on 10/03/2021). He was sent to the ED for further evaluation.  Labs in the ED showed a Hg level of 9.6. HCT 30.8. MCV 107. PLT 138.K+  5.7. BUN 50 (BUN 30 on 1/10 and BUN 63 on 10/02/2021). Alk phos 299. T. Bili 0.1. AST 19. ALT 11. FOBT was negative but ED noted grayish-black stool.  He reports passing normal brown stools most days.  He denies seeing any bright red blood or black stools.  He complains of mostly left lower abdominal pain and left lower back pain.  His wife is not at the bedside at this time.  He denies having any chest pain or shortness of breath.  Patient currently in route to Endo for small bowel enteroscopy with Dr. Candis Schatz.   PAST GI PROCEDURES: 10/2017 EGD.  Nonobstructing, mild distal esophageal stricture was dilated.  Otherwise normal study. 10/2017 colonoscopy.  Tubular adenomatous polyp removed from transverse colon, hyperplastic polyp removed from rectosigmoid.  Solitary, nonbleeding erosion at transverse colon with no stigmata of or active bleeding.  Scattered small and large diverticula throughout the colon.  Nonbleeding, small internal hemorrhoids 05/2018 SBE: AVM with stigmata of recent bleeding at gastric fundis treated with APC. 10/2018 SBE. For anemia, obscure GI bleeding, history of gastric AVM.  Findings of a single, nonbleeding gastric AVM treated with APC.  Gastric erythema.  2 spots of oozing in the duodenum treated with APC.  3, nonbleeding AVMs in jejunum treated with APC.  Tattoo placed at distal extent of exam in jejunum 02/2019 capsule endoscopy.  No active bleeding or oozing.  There were few tiny red spots, possible AVMs in mid small bowel at 2 hours 30 minutes.  Distal, nonbleeding, small bowel AVMs at  7 hours 13 minutes 11/2019 SBE.  Sliding hiatal hernia.  Nonobstructing Schatzki's ring.  2, nonbleeding, AVMs in third and fourth duodenum treated with APC.  3, nonbleeding, AVMs in proximal jejunum treated with APC.  Previously placed tattoos visualized. 06/2021 push enteroscopy Nonbleeding gastric AVM, AVM in D4, 2 AVMs in proximal jejunum all treated with APC.  Started on octreotide 50 mcg  bid   Past Medical History:  Diagnosis Date   Allergy    Alzheimer's disease ([place]) 01/19/2021   Anemia    Arthritis    Cataract    bil cateracts removed   Coronary artery disease    Dementia arising in the senium and presenium ([place])    Diabetes mellitus    Type 2   Diverticulitis    ED (erectile dysfunction)    Elevated homocysteine    ESRD (end stage renal disease) on dialysis ([place]) 03/2015   M-W-F dialysis   GERD (gastroesophageal reflux disease)    pepto    Gout    Hiatal hernia    Hyperlipidemia    Hypertension    Hypothyroidism    PVD (peripheral vascular disease) ([place])    has plastic aorta   Renal insufficiency    Sleep apnea    does not wear c-pap    Past Surgical History:  Procedure Laterality Date   ANGIOPLASTY Right 01/26/2021   Procedure: ANGIOPLASTY RIGHT SUBCLAVIAN VEIN;  Surgeon: Serafina Mitchell, MD;  Location: [place];  Service: Vascular;  Laterality: Right;   aortobifemoral bypass     AV FISTULA PLACEMENT Left 12/01/2013   Procedure: ARTERIOVENOUS (AV) FISTULA CREATION- LEFT BRACHIOCEPHALIC;  Surgeon: Angelia Mould, MD;  Location: [place];  Service: Vascular;  Laterality: Left;   [place] Right 07/27/2014   Procedure: BASCILIC VEIN TRANSPOSITION;  Surgeon: Angelia Mould, MD;  Location: [place];  Service: Vascular;  Laterality: Right;   BRAIN SURGERY  07/22/2018   BREAST SURGERY     left - granulomatous mastitis   BURR HOLE Bilateral 07/22/2018   Procedure: BILATERAL BURR HOLES;  Surgeon: Kristeen Miss, MD;  Location: [place];  Service: Neurosurgery;  Laterality: Bilateral;   COLONOSCOPY     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 ([place] [facility])  1992   ENTEROSCOPY N/A 06/03/2018   Procedure: ENTEROSCOPY;  Surgeon: Lavena Bullion, DO;  Location: [facility];  Service: Gastroenterology;  Laterality: N/A;   ENTEROSCOPY N/A 11/20/2018   Procedure: ENTEROSCOPY;  Surgeon: Rush Landmark Telford Nab., MD;  Location: [place];  Service:  Gastroenterology;  Laterality: N/A;   ENTEROSCOPY N/A 12/03/2019   Procedure: ENTEROSCOPY;  Surgeon: Lavena Bullion, DO;  Location: [facility] ENDOS[facility];  Service: Gastroenterology;  Laterality: N/A;  push enteroscopy   ENTEROSCOPY N/A 07/04/2021   Procedure: ENTEROSCOPY;  Surgeon: Sharyn Creamer, MD;  Location: [facility] ENDOSCOPY;  Service: Gastroenterology;  Laterality: N/A;   EYE SURGERY Bilateral    cataracts   FISTULOGRAM Right 01/26/2021   Procedure: FISTULOGRAM RIGHT;  Surgeon: Serafina Mitchell, MD;  Location[facility] OR;  Service: Vascular;  Laterality: Right;   HEMODIALYSIS INPATIENT  01/17/2018       HOT HEMOSTASIS N/A 06/03/2018   Procedure: HOT HEMOSTASIS (ARGON PLASMA COAGULATION/BICAP);  Surgeon: Lavena Bullion, DO;  Location: [facility] ENDOSCOPY;  Service: Gastroenterology;  Laterality: N/A;   HOT HEMOSTASIS N/A 11/20/2018   Procedure: HOT HEMOSTASIS (ARGON PLASMA COAGULATION/BICAP);  Surgeon: Irving Copas., MD;  Location: [place];  Service: Gastroenterology;  Laterality: N/A;   HOT HEMOSTASIS  N/A 12/03/2019   Procedure: HOT HEMOSTASIS (ARGON PLASMA COAGULATION/BICAP);  Surgeon: Lavena Bullion, DO;  Location: [facility] ENDOSCOPY;  Service: Gastroenterology;  Laterality: N/A;   INTRAMEDULLARY (IM) NAIL INTERTROCHANTERIC Right 11/08/2020   Procedure: INTRAMEDULLARY (IM) NAIL INTERTROCHANTRIC;  Surgeon: Erle Crocker, MD;  Location: [place];  Service: Orthopedics;  Laterality: Right;   REVISION OF ARTERIOVENOUS GORETEX GRAFT Right 09/01/2019   Procedure: REVISION OF ARTERIOVENOUS FISTULA RIGHT ARM;  Surgeon: Serafina Mitchell, MD;  Location: [place];  Service: Vascular;  Laterality: Right;   REVISON OF ARTERIOVENOUS FISTULA Left 02/09/2014   Procedure: REVISON OF LEFT ARTERIOVENOUS FISTULA - RESECTION OF RENDUNDANT VEIN;  Surgeon: Angelia Mould, MD;  Location: [place];  Service: Vascular;  Laterality: Left;   REVISON OF ARTERIOVENOUS FISTULA Right 01/26/2021   Procedure: REVISON OF  ARTERIOVENOUS FISTULA RIGHT;  Surgeon: Serafina Mitchell, MD;  Location: [place];  Service: Vascular;  Laterality: Right;   SBO with lysis adhesions     SHUNTOGRAM Left 04/19/2014   Procedure: FISTULOGRAM;  Surgeon: Angelia Mould, MD;  Location: [facility] CATH LAB;  Service: Cardiovascular;  Laterality: Left;   UNILATERAL UPPER EXTREMEITY ANGIOGRAM N/A 07/12/2014   Procedure: UNILATERAL UPPER Anselmo Rod;  Surgeon: Angelia Mould, MD;  Location: [facility] CATH LAB;  Service: Cardiovascular;  Laterality: N/A;    Prior to Admission medications   Medication Sig Start Date End Date Taking? Authorizing Provider  acetaminophen (TYLENOL) 325 MG tablet Take 1-2 tablets (325-650 mg total) by mouth every 4 (four) hours as needed for mild pain. 08/01/18  Yes Love, Ivan Anchors, PA-C  albuterol (VENTOLIN HFA) 108 (90 Base) MCG/ACT inhaler Inhale 1 puff into the lungs daily as needed for wheezing or shortness of breath.   Yes [provider]  allopurinol (ZYLOPRIM) 100 MG tablet Take 100 mg by mouth daily.   Yes [provider]  camphor-menthol Timoteo Ace) lotion Apply 1 application topically 4 (four) times daily as needed for itching.    Yes [provider]  Carboxymethylcellulose Sodium (THERATEARS) 0.25 % SOLN Place 1 drop into both eyes daily as needed (for dryness).   Yes [provider]  cetirizine (ZYRTEC) 10 MG tablet Take 10 mg by mouth daily as needed for allergies.   Yes [provider]  clobetasol (TEMOVATE) 0.05 % external solution Apply 1 application topically 2 (two) times daily as needed (scalp irritation.).  04/19/15  Yes [provider]  donepezil (ARICEPT) 10 MG tablet Take 1 tablet (10 mg total) by mouth at bedtime. 09/26/21  Yes Melvenia Beam, MD  FLUOCINOLONE ACETONIDE SCALP 0.01 % OIL Apply 1 application topically daily as needed (scalp irritation.).  04/28/15  Yes [provider]  fluticasone (FLONASE) 50 MCG/ACT nasal spray  Place 1 spray into both nostrils daily. Patient taking differently: Place 1 spray into both nostrils daily as needed for allergies. 10/08/14  Yes Mikhail, Velta Addison, DO  hydrocortisone 2.5 % cream Apply 1 application topically 3 (three) times daily as needed (skin irritation (legs & arms)).  01/13/19  Yes [provider]  ketoconazole (NIZORAL) 2 % shampoo Apply 1 application topically 2 (two) times a week. 03/31/15  Yes Baxley, Cresenciano Lick, MD  levothyroxine (SYNTHROID, LEVOTHROID) 50 MCG tablet Take 1 tablet (50 mcg total) by mouth daily. Patient taking differently: Take 50 mcg by mouth daily before breakfast. 08/06/14  Yes Baxley, Cresenciano Lick, MD  lidocaine (LIDODERM) 5 % Place 1 patch onto the skin daily as needed (back pain). 11/12/19  Yes [provider]  memantine (NAMENDA) 10 MG tablet Take 1 tablet (10 mg total) by mouth 2 (two) times daily. 09/26/21  Yes Melvenia Beam, MD  midodrine (PROAMATINE) 10 MG tablet Take one pill prior to hemodialysis on MWF Patient taking differently: Take 5 mg by mouth See admin instructions. Take one tablet by mouth prior to hemodialysis on MWF 08/13/18  Yes Love, Ivan Anchors, PA-C  Nutritional Supplements (FEEDING SUPPLEMENT, NEPRO CARB STEADY,) LIQD Take 237 mLs by mouth daily.   Yes [provider]  omeprazole (PRILOSEC) 40 MG capsule Take 1 capsule (40 mg total) by mouth daily. 03/13/18  Yes Esterwood, Amy S, PA-C  sevelamer carbonate (RENVELA) 800 MG tablet Take 1,600-2,400 mg by mouth See admin instructions. Take 2,400 mg by mouth three times a day with meals and 1,600 mg with snacks per spouse   Yes [provider]  simvastatin (ZOCOR) 20 MG tablet Take 20 mg by mouth at bedtime.   Yes [provider]  Teriparatide, Recombinant, (FORTEO) 600 MCG/2.4ML SOPN Inject 20 mcg subcutaneously daily Patient taking differently: Inject 20 mcg into the skin at bedtime. 09/08/21  Yes Elayne Snare, MD  TRADJENTA 5 MG TABS tablet TAKE 1 TABLET  DAILY Patient taking differently: Take 5 mg by mouth daily. 03/01/20  Yes Elayne Snare, MD  Blood Glucose Monitoring Suppl (FREESTYLE FREEDOM LITE) w/Device KIT Use to check blood sugar 2 times per day dx code E11.65 10/21/18   Elayne Snare, MD  glucose blood (FREESTYLE LITE) test strip USE AS DIRECTED THREE TIMES DAILY Patient taking differently: Check blood sugar bid 01/16/21   Elayne Snare, MD  Insulin Pen Needle (PEN NEEDLES) 32G X 4 MM MISC 1 each by Does not apply route 3 (three) times daily. USE PEN NEEDLES TO INJECT INSULIN THREE TIMES DAILY. 11/25/18   Elayne Snare, MD  Lancets (FREESTYLE) lancets Use as instructed to check blood sugar 2 times per day dx code E11.65 02/22/16   Elayne Snare, MD    Current Facility-Administered Medications  Medication Dose Route Frequency Provider Last Rate Last Admin   acetaminophen (TYLENOL) tablet 325-650 mg  325-650 mg Oral Q4H PRN Tu, Ching T, DO       allopurinol (ZYLOPRIM) tablet 100 mg  100 mg Oral Daily Tu, Ching T, DO       cefTRIAXone (ROCEPHIN) 1 g in sodium chloride 0.9 % 100 mL IVPB  1 g Intravenous Q24H Tu, Ching T, DO 200 mL/hr at 11/04/21 2346 1 g at 11/04/21 2346   donepezil (ARICEPT) tablet 10 mg  10 mg Oral QHS Tu, Ching T, DO       levothyroxine (SYNTHROID) tablet 50 mcg  50 mcg Oral QAC breakfast Tu, Ching T, DO       melatonin tablet 3 mg  3 mg Oral QHS PRN Opyd, Ilene Qua, MD   3 mg at 11/04/21 2338   memantine (NAMENDA) tablet 10 mg  10 mg Oral BID Tu, Ching T, DO   10 mg at 11/05/21 0233   [START ON 11/06/2021] midodrine (PROAMATINE) tablet 5 mg  5 mg Oral Q M,W,F Tu, Ching T, DO       morphine (PF) 4 MG/ML injection 2 mg  2 mg Intravenous Once Noemi Chapel, MD       sevelamer carbonate (RENVELA) tablet 1,600 mg  1,600 mg Oral BID BM PRN Tu, Ching T, DO       sevelamer carbonate (RENVELA) tablet 2,400 mg  2,400 mg Oral TID  WC Tu, Ching T, DO       simvastatin (ZOCOR) tablet 20 mg  20 mg Oral QHS Tu, Ching T, DO       Teriparatide  (Recombinant) SOPN 20 mcg  20 mcg Subcutaneous QHS Tu, Ching T, DO        Allergies as of 11/04/2021 - Review Complete 11/04/2021  Allergen Reaction Noted   Ambien [zolpidem tartrate] Other (See Comments) 11/21/2018   Penicillins Rash and Hives 12/27/2000    Family History  Problem Relation Age of Onset   Aneurysm Mother        brain   Heart disease Father    Stroke Father    Hypertension Father    Diabetes Father    Dementia Neg Hx    Colon cancer Neg Hx    Esophageal cancer Neg Hx    Pancreatic cancer Neg Hx    Prostate cancer Neg Hx    Rectal cancer Neg Hx    Stomach cancer Neg Hx     Social History   Socioeconomic History   Marital status: Married    Spouse name: [name] Mood   Number of children: 1   Years of education: 16   Highest education level: Not on file  Occupational History   Occupation: Retired  Tobacco Use   Smoking status: Former    Types: Cigarettes    Quit date: 09/24/1994    Years since quitting: 27.1   Smokeless tobacco: Never  Vaping Use   Vaping Use: Never used  Substance and Sexual Activity   Alcohol use: No    Alcohol/week: 0.0 standard drinks    Comment: Quit Oct. 1977 ("somewhat heavy")   Drug use: No   Sexual activity: Not on file  Other Topics Concern   Not on file  Social History Narrative   Lives at home with wife.   Caffeine use: Drinks no tea, "a little soda"   Drinks 1 cup coffee/week   Right handed   VET[employer]AN   Social Determinants of Health   Financial Resource Strain: Not on file  Food Insecurity: No Food Insecurity   Worried About Charity fundraiser in the Last Year: Never true   Ran Out of Food in the Last Year: Never true  Transportation Needs: No Transportation Needs   Lack of Transportation (Medical): No   Lack of Transportation (Non-Medical): No  Physical Activity: Not on file  Stress: Not on file  Social Connections: Not on file  Intimate Partner Violence: Not on file    Review of Systems: See HPI, other  systems reviewed and are negative.  Physical Exam: Vital signs in last 24 hours: Temp:  [97.8 F (36.6 C)-98.1 F (36.7 C)] 97.9 F (36.6 C) (02/12 0441) Pulse Rate:  [64-73] 66 (02/12 0441) Resp:  [9-17] 17 (02/12 0441) BP: (113-153)/(44-83) 132/64 (02/12 0441) SpO2:  [96 %-100 %] 100 % (02/12 0441) Weight:  [66 kg] 66 kg (02/11 2103) Last BM Date: 11/04/21 General:  Alert 81 year old male in no acute distress. Head:  Normocephalic and atraumatic. Eyes:  No scleral icterus. Conjunctiva pink. Ears:  Normal auditory acuity. Nose:  No deformity, discharge or lesions. Mouth: Upper dentures.  No ulcers or lesions.  Neck:  Supple. No lymphadenopathy or thyromegaly.  Lungs: Breath sounds clear, slightly diminished in bases bilaterally. Heart: Regular rate and rhythm, very soft murmur. Abdomen: Soft, nondistended.  Mild LLQ tenderness without rebound or guarding. Lower abdominal scars intact.  Soft bruit auscultated. Rectal: Deferred. Musculoskeletal:  Symmetrical  without gross deformities.  Pulses:  Normal pulses noted. Extremities:  Without clubbing or edema. RUE with positive bruit and thrill. Neurologic:  Alert and  oriented x 2. No focal deficits.  Skin:  Intact without significant lesions or rashes. Psych:  Alert and cooperative. Normal mood and affect.  Intake/Output from previous day: 02/11 0701 - 02/12 0700 In: 600 [P.O.:500; IV Piggyback:100] Out: 0  Intake/Output this shift: Total I/O In: 600 [P.O.:500; IV Piggyback:100] Out: 0   Lab Results: Recent Labs    11/03/21 1211 11/04/21 1519  WBC 9.2 8.9  HGB 10.6* 9.6*  HCT 32.2* 30.8*  PLT 169.0 138*   BMET Recent Labs    11/04/21 1519  NA 142  K 5.7*  CL 99  CO2 30  GLUCOSE 125*  BUN 50*  CREATININE 7.60*  CALCIUM 9.1   LFT Recent Labs    11/04/21 1519  PROT 5.9*  ALBUMIN 3.1*  AST 19  ALT 11  ALKPHOS 229*  BILITOT 0.1*   PT/INR No results for input(s): LABPROT, INR in the last 72  hours. Hepatitis Panel No results for input(s): HEPBSAG, HCVAB, HEPAIGM, HEPBIGM in the last 72 hours.    Studies/Results: No results found.  IMPRESSION/PLAN:  67) 81 year old male with acute on chronic anemia secondary to chronic GI blood loss and ESRD.  History of gastric and small bowel AVMs.  FOBT negative. -NPO -Small bowel enteroscopy with Dr. Candis Schatz today -Further recommendations to be determined after enteroscopy results reviewed  2) Left lower quadrant abdominal pain.  I suspect his left lower quadrant abdominal pain is ischemic in nature in the setting of a known significant vascular disease. -Consider mesenteric and iliac Doppler study during this hospitalization or consider abdominal/pelvic CT angiogram with contrast with dialysis to follow  3) ESRD on HD  4) Dementia   Noralyn Pick  11/05/2021, 12:22PM   I have taken a history, reviewed the chart and examined the patient. I performed a substantive portion of this encounter, including complete performance of at least one of the key components, in conjunction with the APP. I agree with the APP's note, impression and recommendations  81 year old male with history of Alzheimer's dementia, coronary disease, end-stage renal disease and history of recurrent GI bleeding from gastrointestinal AVMs, admitted overnight with reported persistent black stools and decline in hemoglobin.  Patient is also been having vague abdominal pain which has been evaluated multiple times with CT scan.  The patient's hemoglobin has dropped from 12.6 a month ago to 9.6 today.  Fecal occult blood test was negative, but patient's wife for reports that his stools have been black in the last few weeks.  This seems very consistent for recurrent bleeding from gastrointestinal AVMs.  Although it is very likely he has AVMs throughout his small bowel, it seems reasonable to attempt to treat the AVMs that are currently accessible via an enteroscopy.   We will therefore plan for enteroscopy today with ablation of AVMs.  We will try to get the patient back on the octreotide that he had been on previously. With regards to his abdominal pain, his exam is very reassuring, without any rigidity or distention.  When I asked him where exactly the pain is located, he points to an area in the inguinal crease.  He is intermittently and inconsistently tender to palpation throughout his abdomen.  Etiology of this remains unclear, but musculoskeletal etiology definitely possible.  He has risk factors for chronic mesenteric ischemia, but he does not have a  strong postprandial component to his pain.  We will continue to reassess this pain, but it does not appear to be an acute issue.  Acute on chronic anemia - Push enteroscopy today for presumed bleeding AVMs   Stina Gane E. Candis Schatz, MD [facility] Gastroenterology

## 2021-11-05 NOTE — Progress Notes (Signed)
Pt wife Coyle Stordahl called for verbal consent. Phone consent witnessed by I and  Field seismologist. Will place in chart   Phoebe Sharps, RN

## 2021-11-05 NOTE — Progress Notes (Signed)
°  Transition of Care [facility]) Screening Note   Patient Details  Name: [name] Date of Birth: 08/23/1941   Transition of Care [facility]) CM/SW Contact:    Vinie Sill, LCSW Phone Number: 11/05/2021, 9:44 AM    Transition of Care Department [facility]) has reviewed patient and no TOC needs have been identified at this time. We will continue to monitor patient advancement through interdisciplinary progression rounds. If new patient transition needs arise, please place a TOC consult.

## 2021-11-06 DIAGNOSIS — D62 Acute posthemorrhagic anemia: Secondary | ICD-10-CM

## 2021-11-06 DIAGNOSIS — K922 Gastrointestinal hemorrhage, unspecified: Secondary | ICD-10-CM | POA: Diagnosis not present

## 2021-11-06 LAB — BASIC METABOLIC PANEL
Anion gap: 14 (ref 5–15)
BUN: 66 mg/dL — ABNORMAL HIGH (ref 8–23)
CO2: 25 mmol/L (ref 22–32)
Calcium: 9 mg/dL (ref 8.9–10.3)
Chloride: 98 mmol/L (ref 98–111)
Creatinine, Ser: 10.01 mg/dL — ABNORMAL HIGH (ref 0.61–1.24)
GFR, Estimated: 5 mL/min — ABNORMAL LOW (ref >60)
Glucose, Bld: 150 mg/dL — ABNORMAL HIGH (ref 70–99)
Potassium: 5.2 mmol/L — ABNORMAL HIGH (ref 3.5–5.1)
Sodium: 137 mmol/L (ref 135–145)

## 2021-11-06 LAB — CBC
HCT: 27.8 % — ABNORMAL LOW (ref 39.0–52.0)
Hemoglobin: 9.3 g/dL — ABNORMAL LOW (ref 13.0–17.0)
MCH: 33.8 pg (ref 26.0–34.0)
MCHC: 33.5 g/dL (ref 30.0–36.0)
MCV: 101.1 fL — ABNORMAL HIGH (ref 80.0–100.0)
Platelets: 146 10*3/uL — ABNORMAL LOW (ref 150–400)
RBC: 2.75 MIL/uL — ABNORMAL LOW (ref 4.22–5.81)
RDW: 14.8 % (ref 11.5–15.5)
WBC: 13 10*3/uL — ABNORMAL HIGH (ref 4.0–10.5)
nRBC: 0 % (ref 0.0–0.2)

## 2021-11-06 LAB — GLUCOSE, CAPILLARY: Glucose-Capillary: 71 mg/dL (ref 70–99)

## 2021-11-06 LAB — HEPATITIS B SURFACE ANTIBODY, QUANTITATIVE: Hep B S AB Quant (Post): 177.8 m[IU]/mL (ref >9.9)

## 2021-11-06 MED ORDER — FOSFOMYCIN TROMETHAMINE 3 G PO PACK
3.0000 g | PACK | Freq: Once | ORAL | Status: AC
  Administered 2021-11-06: 3 g via ORAL
  Filled 2021-11-06 (×2): qty 3

## 2021-11-06 MED ORDER — OCTREOTIDE ACETATE 20 MG IM KIT
20.0000 mg | PACK | Freq: Once | INTRAMUSCULAR | Status: AC
  Administered 2021-11-06: 20 mg via INTRAMUSCULAR
  Filled 2021-11-06: qty 1

## 2021-11-06 MED ORDER — LIDOCAINE 5 % EX PTCH
1.0000 | MEDICATED_PATCH | CUTANEOUS | Status: DC
  Administered 2021-11-06: 1 via TRANSDERMAL
  Filled 2021-11-06: qty 1

## 2021-11-06 NOTE — Discharge Summary (Signed)
Physician Discharge Summary  COLDEN SAMARAS SEG:[id number] DOB: [birth year] DOA: 11/04/2021  PCP: Elby Showers, MD  Admit date: 11/04/2021 Discharge date: 11/06/2021  Admitted From: home Discharge disposition: home   Recommendations for Outpatient Follow-Up:   Would recommend palliative care consult for [place] or through PCP Cbc 1 week Resume home health Follow urine culture to final (see below re: abx)   Discharge Diagnosis:   Principal Problem:   Acute GI bleeding Active Problems:   End stage renal disease (HCC)   Thrombocytopenia (HCC)   GIB (gastrointestinal bleeding)   Anemia   Alzheimer's disease, unspecified (CODE) ([place])   UTI (urinary tract infection)   Pressure injury of skin   Acute blood loss anemia    Discharge Condition: Improved.  Diet recommendation: renal diet  Wound care: None.  Code status: Full.   History of Present Illness:   ABDISHAKUR [name] is a 81 y.o. male with medical history significant of ESRD on HD MWF, Alzheimer's disease, hypothyroidism, type 2 diabetes, history of AAA repair, subdural hematoma evacuation, history of GI bleed with AVMs who presents with concerns of severe left-sided abdominal pain.   Patient provides limited history.  States for the past several days he has been noting left-sided abdominal pain radiating to the right.  Feels pain is constant and worse with food.  He has ESRD and makes minimal urine with incontinent.  Has been noting dysuria for the past few days.  Denies nausea, vomiting or diarrhea.  Thinks stool might have been dark.   He was hospitalized in 06/2021 with acute on chronic anemia in the setting of abdominal pain and dark stool.  Had small bowel enteroscopy with gastric, duodenal and jejunal x2 AVMs, all nonbleeding and all treated with APC.  He was started on octreotide 50 mcg twice daily but appears in recent GI follow-up on 2/10 he has not been taking the octreotide.   Hospitalized again from 1/8  to 1/12 with acute metabolic encephalopathy thought due to [place], underlying dementia and uremia.  Also treated for presumed UTI with Rocephin.  He also had abdominal pain with CT abdomen and pelvis x2 that was unremarkable.   In the ED, he was afebrile and normotensive on room air.  Hemoglobin of 9.6 which has dropped from 10.6 on 2/10.  A month ago this was at 12.6. Platelet also decreased to 138.   Sodium of 142, K of 5.7, creatinine of 7.60, BUN of 50, CBG of 125, lipase mildly elevated 53, AST of 19, ALT of 11.  Fecal occult blood was negative but ED noted grayish-black stool.   ED physician discussed with GI Dr. Candis Schatz who recommends admission and will see in consultation in the morning.   Hospital Course by Problem:   Acute on chronic anemia concerning for acute GI bleed Hgb has been downward trending over past month from 12.6 -->10.6(2/10) --> 9.6 today. However FOBT negative - s/p EGD: enteroscopy was unremarkable.   No active bleeding or evidence of recent bleeding.  No AVMs encountered, surprisingly.     -octreotide injections x1 in the hospital (being set up outpatient through GI)   UTI? -reports dysuria in the past few days?. -resolved quickly - Started IV Rocephin empirically- changed to fosfomycin x 1 after discussion with pharmacy-- could use a flouroquinolone but would avoid in this elderly HD pt. If able, no Augmentin due to PCN allergy -last culture showed multiple species This culture with 50,000 ecoli/enterococcus  Abdominal pain  -appears resolved?  Unable to reproduce today   Hyperkalemia -getting HD   Thrombocytopenia -stable  -outpatient follow up   ESRD on HD MWF -creatinine stable at 7.6. HD done 2/13 -nephrology consult   Alzheimer's dementia Continue memantine and donezepil -delirium precautions      Medical Consultants:    Renal GI  Discharge Exam:   Vitals:   11/06/21 1530 11/06/21 1600  BP: (!) 149/94 113/82  Pulse:     Resp: 16 18  Temp:    SpO2:     Vitals:   11/06/21 1430 11/06/21 1500 11/06/21 1530 11/06/21 1600  BP: (!) 155/94 (!) 111/96 (!) 149/94 113/82  Pulse:  69    Resp: 15 15 16 18   Temp:      TempSrc:      SpO2:      Weight:      Height:        General exam: Appears calm and comfortable.  The results of significant diagnostics from this hospitalization (including imaging, microbiology, ancillary and laboratory) are listed below for reference.     Procedures and Diagnostic Studies:   No results found.   Labs:   Basic Metabolic Panel: Recent Labs  Lab 11/04/21 1519 11/05/21 0637 11/06/21 0825  NA 142 139 137  K 5.7* 5.2* 5.2*  CL 99 99 98  CO2 30 26 25   GLUCOSE 125* 87 150*  BUN 50* 55* 66*  CREATININE 7.60* 8.53* 10.01*  CALCIUM 9.1 9.3 9.0   GFR Estimated Creatinine Clearance: 5.5 mL/min (A) (by C-G formula based on SCr of 10.01 mg/dL (H)). Liver Function Tests: Recent Labs  Lab 11/04/21 1519  AST 19  ALT 11  ALKPHOS 229*  BILITOT 0.1*  PROT 5.9*  ALBUMIN 3.1*   Recent Labs  Lab 11/04/21 1519  LIPASE 53*   No results for input(s): AMMONIA in the last 168 hours. Coagulation profile Recent Labs  Lab 11/05/21 0637  INR 1.1    CBC: Recent Labs  Lab 11/03/21 1211 11/04/21 1519 11/05/21 0637 11/06/21 0825  WBC 9.2 8.9 7.7 13.0*  NEUTROABS  --  5.7  --   --   HGB 10.6* 9.6* 9.1* 9.3*  HCT 32.2* 30.8* 28.4* 27.8*  MCV 102.7* 107.7* 103.6* 101.1*  PLT 169.0 138* 147* 146*   Cardiac Enzymes: No results for input(s): CKTOTAL, CKMB, CKMBINDEX, TROPONINI in the last 168 hours. BNP: Invalid input(s): POCBNP CBG: No results for input(s): GLUCAP in the last 168 hours. D-Dimer No results for input(s): DDIMER in the last 72 hours. Hgb A1c No results for input(s): HGBA1C in the last 72 hours. Lipid Profile No results for input(s): CHOL, HDL, LDLCALC, TRIG, CHOLHDL, LDLDIRECT in the last 72 hours. Thyroid function studies No results for  input(s): TSH, T4TOTAL, T3FREE, THYROIDAB in the last 72 hours.  Invalid input(s): FREET3 Anemia work up [employer]    11/05/21 0637  FERRITIN 482*  TIBC 253  IRON 53   Microbiology Recent Results (from the past 240 hour(s))  MRSA Next Gen by PCR, Nasal     Status: None   Collection Time: 11/05/21  2:06 AM   Specimen: Nasal Mucosa; Nasal Swab  Result Value Ref Range Status   MRSA by PCR Next Gen NOT DETECTED NOT DETECTED Final    Comment: (NOTE) The GeneXpert MRSA Assay (FDA approved for NASAL specimens only), is one component of a comprehensive MRSA colonization surveillance program. It is not intended to diagnose MRSA infection nor to guide  or monitor treatment for MRSA infections. Test performance is not FDA approved in patients less than 27 years old. Performed at [facility], [place] [address], [place], [place] [address]   Urine Culture     Status: Abnormal (Preliminary result)   Collection Time: 11/05/21  7:00 AM   Specimen: In/Out Cath Urine  Result Value Ref Range Status   Specimen Description IN/OUT CATH URINE  Final   Special Requests NONE  Final   Culture (A)  Final    50,000 COLONIES/mL ENTEROCOCCUS FAECALIS 50,000 COLONIES/mL ESCHERICHIA COLI CULTURE REINCUBATED FOR BETTER GROWTH Performed at [facility], [place] [address], [place],  [address]    Report Status PENDING  Incomplete     Discharge Instructions:   Discharge Instructions     Discharge instructions   Complete by: As directed    Renal/carb mod diet   Increase activity slowly   Complete by: As directed    No wound care   Complete by: As directed       Allergies as of 11/06/2021       Reactions   Ambien [zolpidem Tartrate] Other (See Comments)   Hallucinations and "felt crazy"    Penicillins Rash, Hives   Has patient had a PCN reaction causing immediate rash, facial/tongue/throat swelling, SOB or lightheadedness with hypotension: Yes Has patient had a PCN reaction  causing severe rash involving mucus membranes or skin necrosis: Yes Has patient had a PCN reaction that required hospitalization: No Has patient had a PCN reaction occurring within the last 10 years: No If all of the above answers are "NO", then may proceed with Cephalosporin use. Other reaction(s): HIVES        Medication List     TAKE these medications    acetaminophen 325 MG tablet Commonly known as: TYLENOL Take 1-2 tablets (325-650 mg total) by mouth every 4 (four) hours as needed for mild pain.   albuterol 108 (90 Base) MCG/ACT inhaler Commonly known as: VENTOLIN HFA Inhale 1 puff into the lungs daily as needed for wheezing or shortness of breath.   allopurinol 100 MG tablet Commonly known as: ZYLOPRIM Take 100 mg by mouth daily.   camphor-menthol lotion Commonly known as: SARNA Apply 1 application topically 4 (four) times daily as needed for itching.   cetirizine 10 MG tablet Commonly known as: ZYRTEC Take 10 mg by mouth daily as needed for allergies.   clobetasol 0.05 % external solution Commonly known as: TEMOVATE Apply 1 application topically 2 (two) times daily as needed (scalp irritation.).   donepezil 10 MG tablet Commonly known as: ARICEPT Take 1 tablet (10 mg total) by mouth at bedtime.   feeding supplement (NEPRO CARB STEADY) Liqd Take 237 mLs by mouth daily.   Fluocinolone Acetonide Scalp 0.01 % Oil Apply 1 application topically daily as needed (scalp irritation.).   fluticasone 50 MCG/ACT nasal spray Commonly known as: FLONASE Place 1 spray into both nostrils daily. What changed:  when to take this reasons to take this   Forteo 600 MCG/2.4ML Sopn Generic drug: Teriparatide (Recombinant) Inject 20 mcg subcutaneously daily What changed:  how much to take how to take this when to take this additional instructions   FreeStyle Freedom Lite w/Device Kit Use to check blood sugar 2 times per day dx code E11.65   freestyle lancets Use as  instructed to check blood sugar 2 times per day dx code E11.65   FREESTYLE LITE test strip Generic drug: glucose blood USE AS  DIRECTED THREE TIMES DAILY What changed: additional instructions   hydrocortisone 2.5 % cream Apply 1 application topically 3 (three) times daily as needed (skin irritation (legs & arms)).   ketoconazole 2 % shampoo Commonly known as: Nizoral Apply 1 application topically 2 (two) times a week.   levothyroxine 50 MCG tablet Commonly known as: SYNTHROID Take 1 tablet (50 mcg total) by mouth daily. What changed: when to take this   lidocaine 5 % Commonly known as: LIDODERM Place 1 patch onto the skin daily as needed (back pain).   memantine 10 MG tablet Commonly known as: NAMENDA Take 1 tablet (10 mg total) by mouth 2 (two) times daily.   midodrine 10 MG tablet Commonly known as: PROAMATINE Take one pill prior to hemodialysis on MWF What changed:  how much to take how to take this when to take this additional instructions   omeprazole 40 MG capsule Commonly known as: PRILOSEC Take 1 capsule (40 mg total) by mouth daily.   Pen Needles 32G X 4 MM Misc 1 each by Does not apply route 3 (three) times daily. USE PEN NEEDLES TO INJECT INSULIN THREE TIMES DAILY.   sevelamer carbonate 800 MG tablet Commonly known as: RENVELA Take 1,600-2,400 mg by mouth See admin instructions. Take 2,400 mg by mouth three times a day with meals and 1,600 mg with snacks per spouse   simvastatin 20 MG tablet Commonly known as: ZOCOR Take 20 mg by mouth at bedtime.   Theratears 0.25 % Soln Generic drug: Carboxymethylcellulose Sodium Place 1 drop into both eyes daily as needed (for dryness).   Tradjenta 5 MG Tabs tablet Generic drug: linagliptin TAKE 1 TABLET DAILY What changed: how much to take        Follow-up Information     Care, [facility] Follow up.   Specialty: [place] Why: HHPT - services to resume post discharge Contact  information: [address] [place] [address] [phone]                  Time coordinating discharge: 35 min  Signed:  Geradine Girt DO  [facility] 11/06/2021, 4:34 PM

## 2021-11-06 NOTE — Progress Notes (Signed)
°  [facility] Progress Note    Assessment/ Plan:   Assessment/Plan: ESRD - HD MWF. Continue on schedule. Next HD todat-1st shift. Abdominal pain/Recurrent GIB. Hgb trending down. GI following -s/p EGD/enteroscopy: unremarkable. Hypertension/volume  - Blood pressure acceptable. No gross volume on exam. UF 2L with HD.  Anemia  - Hgb 9.3 this am Tsat 21. Start IV Fe bolus here + ESA  Metabolic bone disease -  Calcium in range. Continue Hectorol/Renvela binder Nutrition - Renal diet/fluid restriction Dementia - on Aricept. per primary  Outpatient Dialysis Orders:  Unit: [place] MWF  Time: 4:00  EDW:  64.5 kg  Flows: 400/ A1.5x  [place]ath: 2K/2Ca  Access: AVF  Heparin: None ESA: None  VDRA: Hectorol 3 TIW   Gean Quint, MD [facility]   Subjective:   No acute events   Objective:   BP (!) 152/55 (BP Location: Left Arm)    Pulse 70    Temp 97.8 F (36.6 C) (Oral)    Resp 16    Ht 5\' 11"  (1.803 m)    Wt 66 kg    SpO2 100%    BMI 20.31 kg/m   Intake/Output Summary (Last 24 hours) at 11/06/2021 0948 Last data filed at 11/05/2021 1851 Gross per 24 hour  Intake 440 ml  Output --  Net 440 ml   Weight change:   Physical Exam: Gen:NAD CVS:rrr, s1s2 Resp:cta bl, no iwob WCH:ENID, nt Ext:no edema Neuro: intermittently confused, responds to questions, following commands Dialysis access: RUE AVF +b/t  Imaging: No results found.  Labs: BMET Recent Labs  Lab 11/04/21 1519 11/05/21 0637 11/06/21 0825  NA 142 139 137  K 5.7* 5.2* 5.2*  CL 99 99 98  CO2 30 26 25   GLUCOSE 125* 87 150*  BUN 50* 55* 66*  CREATININE 7.60* 8.53* 10.01*  CALCIUM 9.1 9.3 9.0   CBC Recent Labs  Lab 11/03/21 1211 11/04/21 1519 11/05/21 0637 11/06/21 0825  WBC 9.2 8.9 7.7 13.0*  NEUTROABS  --  5.7  --   --   HGB 10.6* 9.6* 9.1* 9.3*  HCT 32.2* 30.8* 28.4* 27.8*  MCV 102.7* 107.7* 103.6* 101.1*  PLT 169.0 138* 147* 146*    Medications:     allopurinol  100  mg Oral Daily   donepezil  10 mg Oral QHS   levothyroxine  50 mcg Oral QAC breakfast   lidocaine  1 patch Transdermal Q24H   memantine  10 mg Oral BID   midodrine  5 mg Oral Q M,W,F   morphine (PF)  2 mg Intravenous Once   sevelamer carbonate  2,400 mg Oral TID WC   simvastatin  20 mg Oral QHS   Teriparatide (Recombinant)  20 mcg Subcutaneous QHS      Gean Quint, MD [facility] 11/06/2021, 9:48 AM

## 2021-11-06 NOTE — Progress Notes (Signed)
Patient is confused and has routinely refused labs and this morning's medication. Attempts at reorienting patient have been unsuccessful. Will continue to monitor.

## 2021-11-06 NOTE — Progress Notes (Addendum)
Attending physician's note   I have taken an interval history, reviewed the chart and examined the patient. I agree with the Advanced Practitioner's note, impression, and recommendations as outlined. I spent > 50% of the total time with the patient myself.   Did have some confusion overnight and this morning, and refused a.m. labs.  On my exam, intermittent awareness c/w known history of Alzheimer's dementia.  1) Acute on chronic anemia 2) History of gastric and small bowel AVMs 3) ESRD on HD - History of small bowel AVMs, but none were noted on push enteroscopy on 11/05/2021.  Suspect more distal AVMs, but given underlying comorbidities, current stability, do not think pursuing additional endoscopic evaluation needed at this time.  Instead, plan to treat with octreotide.  He was previously on this in October, but was not taking as an outpatient. - Start octreotide. Will give 20 mg IM LAR now to cover for the next month while we continue to work on outpatient approval for continued use. Please have this on his discharge orders as well - As above, we will continue to work on octreotide LAR approval as an outpatient - Scheduled for HD today - Continue advancing diet - IV iron + ESA can be given as needed with HD - Agree with plan for further endoscopic procedures only for emergent need.  Otherwise, medical management appropriate  4) Dementia - Mental status at baseline - Continue care per primary service  GI service will sign off at this time with anticipated discharge later today after HD.  Hyun Reali, DO, FACG [phone] office          Progress Note   Subjective  Chief Complaint: Abdominal pain and anemia Status post enteroscopy 11/05/2021 with no acute findings.  Today, patient tells me he is doing well, he continues with abdominal pain on the left side of his abdomen.  He is very concerned about his dialysis and is not sure how he is going to get to the bus that  normally picks him up.  Denies any other acute complaints or concerns.    Objective   Vital signs in last 24 hours: Temp:  [97.4 F (36.3 C)-98 F (36.7 C)] 97.8 F (36.6 C) (02/13 0851) Pulse Rate:  [63-72] 70 (02/13 0851) Resp:  [13-17] 16 (02/13 0851) BP: (97-173)/(33-79) 152/55 (02/13 0851) SpO2:  [96 %-100 %] 100 % (02/13 0851) Last BM Date: 11/05/21 General:   male in NAD Heart:  Regular rate and rhythm; no murmurs Lungs: Respirations even and unlabored, lungs CTA bilaterally Abdomen:  Soft mild left-sided TTP and nondistended. Normal bowel sounds. Psych:  Cooperative. Normal mood and affect.  Intake/Output from previous day: 02/12 0701 - 02/13 0700 In: 440 [P.O.:240; I.V.:200] Out: -    Lab Results: Recent Labs    11/04/21 1519 11/05/21 0637 11/06/21 0825  WBC 8.9 7.7 13.0*  HGB 9.6* 9.1* 9.3*  HCT 30.8* 28.4* 27.8*  PLT 138* 147* 146*   BMET Recent Labs    11/04/21 1519 11/05/21 0637 11/06/21 0825  NA 142 139 137  K 5.7* 5.2* 5.2*  CL 99 99 98  CO2 30 26 25   GLUCOSE 125* 87 150*  BUN 50* 55* 66*  CREATININE 7.60* 8.53* 10.01*  CALCIUM 9.1 9.3 9.0   LFT Recent Labs    11/04/21 1519  PROT 5.9*  ALBUMIN 3.1*  AST 19  ALT 11  ALKPHOS 229*  BILITOT 0.1*   PT/INR Recent Labs  11/05/21 0637  LABPROT 13.8  INR 1.1    Studies/Results: Enteroscopy 11/05/2021 Impression:               - Normal esophagus.                           - Small hiatal hernia.                           - Normal examined duodenum.                           - The examined portion of the jejunum was normal.                           - No specimens collected.                           - No AVMs noted, no evidence of bleeding or recent                            bleeding Recommendation:           - Return patient to hospital ward for ongoing care.                           - Resume previous diet.                           - Bleeding may have been secondary to AVMs  deep in                            the small intestine.                           - Given the patient's advanced age and relatively                            mild anemia, would recommend against more                            aggressive interventions such as balloon                            enteroscopy at this time. Recommend medical therapy                            with octreotide, iron supplementation and                            transfusions as needed.                           - Reasonable to observe the patient overnight to                            demonstrate stable hgb.                           -  Dr. Marlynn Perking will be assuming inpatient GI                            responsibilities tomorrow.   Assessment / Plan:   Assessment: 1.  Acute on chronic anemia secondary to chronic GI blood loss and ESRD: Small bowel enteroscopy 11/05/2021 with no acute findings, hemoglobin stable overnight, recommendations for octreotide and iron supplementation and transfusions as needed 2.  ESRD on HD 3.  Dementia 4.  Left-sided abdominal pain: Appears chronic in nature, no change recently  Plan: 1.  Again recommend no further aggressive interventions.  Should treat anemia with medical therapy, Octreotide he was on previously as well as Iron supplementation and transfusion as needed.  Thank you for your kind consultation, we will sign off.   LOS: 2 days   [name]  11/06/2021, 11:05 AM

## 2021-11-06 NOTE — TOC Transition Note (Signed)
Transition of Care (TOC) - CM/SW Discharge Note [name] RN, BSN Transitions of Care Unit 4E- RN Case Manager See Treatment Team for direct phone #    Patient Details  Name: [name] MRN: [id number] Date of Birth: 04-Nov-1940  Transition of Care [facility]) CM/SW Contact:  Dawayne Patricia, RN Phone Number: 11/06/2021, 12:10 PM   Clinical Narrative:    Notified by MD pt will be stable to transition home later today after HD. Pt is active with [facility]mings for [facility] services, has needed DME at home. Wife will transport home after HD- bedside RN will need to call wife when pt ready to transport home.   Order placed for resumption of HHPT- CM has notified Tommi Rumps with [facility] for resumption of [place] services.    Final next level of care: Home w Home Health Services Barriers to Discharge: No Barriers Identified   Patient Goals and CMS Choice Patient states their goals for this hospitalization and ongoing recovery are:: per wife plan to return home CMS Medicare.gov Compare Post Acute Care list provided to:: Patient Represenative (must comment) (spouse) Choice offered to / list presented to : Spouse  Discharge Placement               Home w/ [facility]        Discharge Plan and Services   Discharge Planning Services: CM Consult Post Acute Care Choice: Home Health, Resumption of Svcs/PTA Provider          DME Arranged: N/A DME Agency: NA       HH Arranged: PT HH Agency: [place] Date [place]: 11/06/21 Time [place]: 1208 Representative spoke with at [place]: [place] ([place]) Interventions     Readmission Risk Interventions Readmission Risk Prevention Plan 11/06/2021 10/05/2021  Transportation Screening Complete Complete  PCP or Specialist Appt within 3-5 Days Complete -  [place] or Home Care Consult Complete -  Social Work Consult for [place] Planning/Counseling Complete -  Palliative Care Screening Not  Applicable -  Medication Review Press photographer) Complete Complete  PCP or Specialist appointment within 3-5 days of discharge - Complete  HRI or [place] - Complete  SW Recovery Care/Counseling Consult - Complete  [place] - Not Applicable  Some recent data might be hidden

## 2021-11-06 NOTE — Evaluation (Signed)
Physical Therapy Evaluation Patient Details Name: [name] MRN: [id number] DOB: 1941/09/22 Today's Date: 11/06/2021  History of Present Illness  Patient is a 81 y/o male who presents on 11/05/21 due to abdominal pain and anemia. Found to have Acute on chronic anemia concerning for acute GI bleed and UTI. s/p enteroscopy 11/05/21. PMH includes dementia, fall with Rt hip fx s/p IM nailing 10/2020, ESRD on HD MWF, PVD, HTN, DVT, hx of AAA repair, SDH evacuation, hx of GI bleed with AVMs.  Clinical Impression  Patient presents with baseline cognitive deficits, generalized weakness, decreased activity tolerance, impaired balance and impaired mobility s/p above. Pt not the best historian but per chart review, pt lives at home with wife and has a PCA come in to assist with ADLs/IADLs at baseline; uses rollator for household ambulation and w/c for community ambulation. Upon arrival today, pt laying with head at foot of the bed, requiring Min A for bed mobility/ standing and close Min guard for ambulation with use of rollator. Limited activity tolerance and cardiovascular endurance. Due to above problems as well as cognitive deficits, pt is a fall risk. Will follow acutely to maximize independence and mobility prior to return home.     Recommendations for follow up therapy are one component of a multi-disciplinary discharge planning process, led by the attending physician.  Recommendations may be updated based on patient status, additional functional criteria and insurance authorization.  Follow Up Recommendations Home health PT    Assistance Recommended at Discharge Frequent or constant Supervision/Assistance  Patient can return home with the following  A little help with walking and/or transfers;A little help with bathing/dressing/bathroom;Assistance with cooking/housework;Direct supervision/assist for financial management;Assist for transportation;Help with stairs or ramp for entrance;Direct  supervision/assist for medications management    Equipment Recommendations None recommended by PT  Recommendations for Other Services       Functional Status Assessment Patient has had a recent decline in their functional status and demonstrates the ability to make significant improvements in function in a reasonable and predictable amount of time.     Precautions / Restrictions Precautions Precautions: Fall Restrictions Weight Bearing Restrictions: No      Mobility  Bed Mobility Overal bed mobility: Needs Assistance Bed Mobility: Supine to Sit, Sit to Supine     Supine to sit: Min assist Sit to supine: Min guard, HOB elevated   General bed mobility comments: Assist with trunk to get to EOB, reporting back pain. HOB flat laying with head where feet are supposed to be.    Transfers Overall transfer level: Needs assistance Equipment used: Rollator (4 wheels) Transfers: Sit to/from Stand Sit to Stand: Min assist           General transfer comment: Min A to stand from EOB, attempted to stand without help but unable,. Cues for hand placement.    Ambulation/Gait Ambulation/Gait assistance: Min guard Gait Distance (Feet): 70 Feet Assistive device: Rollator (4 wheels) Gait Pattern/deviations: Step-through pattern, Decreased stride length, Trunk flexed Gait velocity: decreased Gait velocity interpretation: <1.31 ft/sec, indicative of household ambulator   General Gait Details: Slow, mildly unsteady gait with rollator, cues for upright. 2/4 DOE. No LOB.  Stairs            Wheelchair Mobility    Modified Rankin (Stroke Patients Only)       Balance Overall balance assessment: Needs assistance Sitting-balance support: Feet supported, No upper extremity supported Sitting balance-Leahy Scale: Fair     Standing balance support: During functional activity, Reliant  on assistive device for balance Standing balance-Leahy Scale: Poor                                Pertinent Vitals/Pain Pain Assessment Pain Assessment: Faces Faces Pain Scale: Hurts even more Pain Location: back Pain Descriptors / Indicators: Grimacing, Guarding, Sore Pain Intervention(s): Monitored during session, Repositioned, Limited activity within patient's tolerance, Patient requesting pain meds-RN notified    Home Living Family/patient expects to be discharged to:: Private residence Living Arrangements: Spouse/significant other Available Help at Discharge: Family;Personal care attendant;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 hours/day Type of Home: House Home Access: Other (comment);Stairs to enter   [employer] of Steps: stair lift in garage   Home Layout: Two level;Able to live on main level with bedroom/bathroom Home Equipment: Rolling Walker (2 wheels);Wheelchair - manual;Transport chair;Grab bars - toilet;Hospital bed;Rollator (4 wheels) Additional Comments: Info taken from admission 1 month ago and some from today due to hx of dementia.    Prior Function Prior Level of Function : Patient poor historian/Family not available  Cognitive Assist : ADLs (cognitive)   ADLs (Cognitive): Step by step cues Physical Assist : Mobility (physical);ADLs (physical) Mobility (physical): Transfers;Gait ADLs (physical): Grooming;Bathing;Dressing;Toileting;IADLs Mobility Comments: Mobility with Supervision A and RW in home and wheelchair vs transport chair in community dwellings. ADLs Comments: Assist with bathing/dressing and toileting. Able to feed himself. Wife reports increased need for external assist with ADLs.     Hand Dominance   Dominant Hand: Right    Extremity/Trunk Assessment   Upper Extremity Assessment Upper Extremity Assessment: Defer to OT evaluation    Lower Extremity Assessment Lower Extremity Assessment: Generalized weakness    Cervical / Trunk Assessment Cervical / Trunk Assessment: Other exceptions Cervical / Trunk Exceptions: chronic baclk pain   Communication   Communication: No difficulties  Cognition Arousal/Alertness: Awake/alert Behavior During Therapy: WFL for tasks assessed/performed Overall Cognitive Status: History of cognitive impairments - at baseline                                 General Comments: Hx of dementia; follows commands well. Upon arrival, pt laying in bed with head where feet are supposed to be reporting back pain and no one has come in to see him.        General Comments General comments (skin integrity, edema, etc.): VSS on RA.    Exercises     Assessment/Plan    PT Assessment Patient needs continued PT services  PT Problem List Decreased strength;Decreased mobility;Decreased safety awareness;Pain;Decreased balance;Decreased cognition;Decreased activity tolerance;Cardiopulmonary status limiting activity       PT Treatment Interventions Therapeutic exercise;DME instruction;Gait training;Balance training;Patient/family education;Therapeutic activities;Functional mobility training    PT Goals (Current goals can be found in the Care Plan section)  Acute Rehab PT Goals Patient Stated Goal: to go home PT Goal Formulation: Patient unable to participate in goal setting Time For Goal Achievement: 11/20/21 Potential to Achieve Goals: Fair    Frequency Min 3X/week     Co-evaluation               AM-PAC PT "6 Clicks" Mobility  Outcome Measure Help needed turning from your back to your side while in a flat bed without using bedrails?: A Little Help needed moving from lying on your back to sitting on the side of a flat bed without using bedrails?: A Little Help needed moving to and  from a bed to a chair (including a wheelchair)?: A Little Help needed standing up from a chair using your arms (e.g., wheelchair or bedside chair)?: A Little Help needed to walk in hospital room?: A Little Help needed climbing 3-5 steps with a railing? : A Little 6 Click Score: 18    End of  Session Equipment Utilized During Treatment: Gait belt Activity Tolerance: Patient tolerated treatment well Patient left: in bed;with call bell/phone within reach;with bed alarm set Nurse Communication: Mobility status;Patient requests pain meds PT Visit Diagnosis: Pain;Other abnormalities of gait and mobility (R26.89);Unsteadiness on feet (R26.81);Muscle weakness (generalized) (M62.81) Pain - part of body:  (back=chronic)    Time: 2446-2863 PT Time Calculation (min) (ACUTE ONLY): 19 min   Charges:   PT Evaluation $PT Eval Moderate Complexity: 1 Mod          Marisa Severin, PT, DPT Acute Rehabilitation Services Pager [phone] Office [phone]     [name] 11/06/2021, 9:45 AM

## 2021-11-07 DIAGNOSIS — I120 Hypertensive chronic kidney disease with stage 5 chronic kidney disease or end stage renal disease: Secondary | ICD-10-CM | POA: Diagnosis not present

## 2021-11-07 DIAGNOSIS — Z7983 Long term (current) use of bisphosphonates: Secondary | ICD-10-CM | POA: Diagnosis not present

## 2021-11-07 DIAGNOSIS — E1151 Type 2 diabetes mellitus with diabetic peripheral angiopathy without gangrene: Secondary | ICD-10-CM | POA: Diagnosis not present

## 2021-11-07 DIAGNOSIS — N186 End stage renal disease: Secondary | ICD-10-CM | POA: Diagnosis not present

## 2021-11-07 DIAGNOSIS — Z86718 Personal history of other venous thrombosis and embolism: Secondary | ICD-10-CM | POA: Diagnosis not present

## 2021-11-07 DIAGNOSIS — Z87440 Personal history of urinary (tract) infections: Secondary | ICD-10-CM | POA: Diagnosis not present

## 2021-11-07 DIAGNOSIS — F0280 Dementia in other diseases classified elsewhere without behavioral disturbance: Secondary | ICD-10-CM | POA: Diagnosis not present

## 2021-11-07 DIAGNOSIS — E1122 Type 2 diabetes mellitus with diabetic chronic kidney disease: Secondary | ICD-10-CM | POA: Diagnosis not present

## 2021-11-07 DIAGNOSIS — E870 Hyperosmolality and hypernatremia: Secondary | ICD-10-CM | POA: Diagnosis not present

## 2021-11-07 DIAGNOSIS — G309 Alzheimer's disease, unspecified: Secondary | ICD-10-CM | POA: Diagnosis not present

## 2021-11-07 DIAGNOSIS — Z7984 Long term (current) use of oral hypoglycemic drugs: Secondary | ICD-10-CM | POA: Diagnosis not present

## 2021-11-07 DIAGNOSIS — Z992 Dependence on renal dialysis: Secondary | ICD-10-CM | POA: Diagnosis not present

## 2021-11-07 DIAGNOSIS — R911 Solitary pulmonary nodule: Secondary | ICD-10-CM | POA: Diagnosis not present

## 2021-11-07 DIAGNOSIS — Z9181 History of falling: Secondary | ICD-10-CM | POA: Diagnosis not present

## 2021-11-07 DIAGNOSIS — G9341 Metabolic encephalopathy: Secondary | ICD-10-CM | POA: Diagnosis not present

## 2021-11-07 LAB — URINE CULTURE: Culture: 50000 — AB

## 2021-11-07 LAB — HEPATITIS B SURFACE ANTIGEN: Hepatitis B Surface Ag: NEGATIVE — AB

## 2021-11-07 LAB — HEPATITIS B SURFACE ANTIBODY,QUALITATIVE: Hep B S Ab: REACTIVE — AB

## 2021-11-08 DIAGNOSIS — N186 End stage renal disease: Secondary | ICD-10-CM | POA: Diagnosis not present

## 2021-11-08 DIAGNOSIS — E876 Hypokalemia: Secondary | ICD-10-CM | POA: Diagnosis not present

## 2021-11-08 DIAGNOSIS — Z992 Dependence on renal dialysis: Secondary | ICD-10-CM | POA: Diagnosis not present

## 2021-11-08 DIAGNOSIS — A498 Other bacterial infections of unspecified site: Secondary | ICD-10-CM | POA: Diagnosis not present

## 2021-11-08 DIAGNOSIS — N2581 Secondary hyperparathyroidism of renal origin: Secondary | ICD-10-CM | POA: Diagnosis not present

## 2021-11-09 DIAGNOSIS — F0280 Dementia in other diseases classified elsewhere without behavioral disturbance: Secondary | ICD-10-CM | POA: Diagnosis not present

## 2021-11-09 DIAGNOSIS — G309 Alzheimer's disease, unspecified: Secondary | ICD-10-CM | POA: Diagnosis not present

## 2021-11-09 DIAGNOSIS — N186 End stage renal disease: Secondary | ICD-10-CM | POA: Diagnosis not present

## 2021-11-09 DIAGNOSIS — I120 Hypertensive chronic kidney disease with stage 5 chronic kidney disease or end stage renal disease: Secondary | ICD-10-CM | POA: Diagnosis not present

## 2021-11-09 DIAGNOSIS — G9341 Metabolic encephalopathy: Secondary | ICD-10-CM | POA: Diagnosis not present

## 2021-11-09 DIAGNOSIS — E1122 Type 2 diabetes mellitus with diabetic chronic kidney disease: Secondary | ICD-10-CM | POA: Diagnosis not present

## 2021-11-10 DIAGNOSIS — N186 End stage renal disease: Secondary | ICD-10-CM | POA: Diagnosis not present

## 2021-11-10 DIAGNOSIS — A498 Other bacterial infections of unspecified site: Secondary | ICD-10-CM | POA: Diagnosis not present

## 2021-11-10 DIAGNOSIS — E876 Hypokalemia: Secondary | ICD-10-CM | POA: Diagnosis not present

## 2021-11-10 DIAGNOSIS — Z992 Dependence on renal dialysis: Secondary | ICD-10-CM | POA: Diagnosis not present

## 2021-11-10 DIAGNOSIS — N2581 Secondary hyperparathyroidism of renal origin: Secondary | ICD-10-CM | POA: Diagnosis not present

## 2021-11-13 DIAGNOSIS — E876 Hypokalemia: Secondary | ICD-10-CM | POA: Diagnosis not present

## 2021-11-13 DIAGNOSIS — N186 End stage renal disease: Secondary | ICD-10-CM | POA: Diagnosis not present

## 2021-11-13 DIAGNOSIS — N2581 Secondary hyperparathyroidism of renal origin: Secondary | ICD-10-CM | POA: Diagnosis not present

## 2021-11-13 DIAGNOSIS — Z992 Dependence on renal dialysis: Secondary | ICD-10-CM | POA: Diagnosis not present

## 2021-11-13 DIAGNOSIS — A498 Other bacterial infections of unspecified site: Secondary | ICD-10-CM | POA: Diagnosis not present

## 2021-11-14 ENCOUNTER — Other Ambulatory Visit (INDEPENDENT_AMBULATORY_CARE_PROVIDER_SITE_OTHER): Payer: Medicare Other

## 2021-11-14 ENCOUNTER — Other Ambulatory Visit: Payer: Self-pay

## 2021-11-14 DIAGNOSIS — M8000XD Age-related osteoporosis with current pathological fracture, unspecified site, subsequent encounter for fracture with routine healing: Secondary | ICD-10-CM | POA: Diagnosis not present

## 2021-11-14 DIAGNOSIS — E1165 Type 2 diabetes mellitus with hyperglycemia: Secondary | ICD-10-CM | POA: Diagnosis not present

## 2021-11-14 LAB — GLUCOSE, RANDOM: Glucose, Bld: 94 mg/dL (ref 70–99)

## 2021-11-14 LAB — CALCIUM: Calcium: 9.6 mg/dL (ref 8.4–10.5)

## 2021-11-15 DIAGNOSIS — N186 End stage renal disease: Secondary | ICD-10-CM | POA: Diagnosis not present

## 2021-11-15 DIAGNOSIS — A498 Other bacterial infections of unspecified site: Secondary | ICD-10-CM | POA: Diagnosis not present

## 2021-11-15 DIAGNOSIS — E876 Hypokalemia: Secondary | ICD-10-CM | POA: Diagnosis not present

## 2021-11-15 DIAGNOSIS — N2581 Secondary hyperparathyroidism of renal origin: Secondary | ICD-10-CM | POA: Diagnosis not present

## 2021-11-15 DIAGNOSIS — Z992 Dependence on renal dialysis: Secondary | ICD-10-CM | POA: Diagnosis not present

## 2021-11-15 LAB — FRUCTOSAMINE: Fructosamine: 359 umol/L — ABNORMAL HIGH (ref 0–285)

## 2021-11-16 ENCOUNTER — Encounter: Payer: Self-pay | Admitting: Endocrinology

## 2021-11-16 ENCOUNTER — Other Ambulatory Visit: Payer: Self-pay

## 2021-11-16 ENCOUNTER — Ambulatory Visit (INDEPENDENT_AMBULATORY_CARE_PROVIDER_SITE_OTHER): Payer: Medicare Other | Admitting: Endocrinology

## 2021-11-16 VITALS — BP 112/48 | HR 68 | Ht 71.0 in | Wt 144.6 lb

## 2021-11-16 DIAGNOSIS — G309 Alzheimer's disease, unspecified: Secondary | ICD-10-CM | POA: Diagnosis not present

## 2021-11-16 DIAGNOSIS — N186 End stage renal disease: Secondary | ICD-10-CM | POA: Diagnosis not present

## 2021-11-16 DIAGNOSIS — E1165 Type 2 diabetes mellitus with hyperglycemia: Secondary | ICD-10-CM

## 2021-11-16 DIAGNOSIS — G9341 Metabolic encephalopathy: Secondary | ICD-10-CM | POA: Diagnosis not present

## 2021-11-16 DIAGNOSIS — E1122 Type 2 diabetes mellitus with diabetic chronic kidney disease: Secondary | ICD-10-CM | POA: Diagnosis not present

## 2021-11-16 DIAGNOSIS — F0280 Dementia in other diseases classified elsewhere without behavioral disturbance: Secondary | ICD-10-CM | POA: Diagnosis not present

## 2021-11-16 DIAGNOSIS — I120 Hypertensive chronic kidney disease with stage 5 chronic kidney disease or end stage renal disease: Secondary | ICD-10-CM | POA: Diagnosis not present

## 2021-11-16 MED ORDER — TRADJENTA 5 MG PO TABS: 5.0000 mg | ORAL_TABLET | Freq: Every day | ORAL | 3 refills | Status: AC

## 2021-11-16 NOTE — Patient Instructions (Signed)
Check blood sugars on waking up days a week  Also check blood sugars about 2 hours after meals and do this after different meals by rotation  Recommended blood sugar levels on waking up are 90-130 and about 2 hours after meal is 130-160  Please bring your blood sugar monitor to each visit, thank you  4 Units Novolog at dinner if 2 hr sugar >180 consistently  Less Carbs with snack after dialysis

## 2021-11-16 NOTE — Progress Notes (Signed)
Patient ID: [name], male   DOB: [birth year], 81 y.o.   MRN: [id number]           Reason for Appointment:  Follow-up for endocrinology problems   History of Present Illness:          Date of diagnosis of type 2 diabetes mellitus:  1996      Background history:  He has had long-standing diabetes probably treated with metformin initially and subsequently with sulfonylurea drugs At some point he was also given Actos in addition to his glipizide which he is still taking Records of his control are only available for the last 4 years not on Over the last few years his A1c has been generally in the 6-7% range His A1c was 6.2 in May 2016 and apparently was starting to get low normal blood sugars at times He was admitted to the hospital for fluid overload and renal failure and at that time his Actos was stopped Subsequently his blood sugars had been markedly increased He was switched from regular insulin to NovoLog on his initial consultation  Recent history:   INSULIN regimen: None, previously on NovoLog 6 units with breakfast and usually 6 units before dinner  Oral hypoglycemic drugs:  Tradjenta 5 mg daily  His A1c tends to be falsely low, last 6.1  Fructosamine in relatively higher at 359 compared to 306   Current blood sugar patterns and problems identified: His wife is again managing his diabetes and monitoring  She is checking blood sugars mostly before dinnertime and only sporadically in the mornings  On several days he has readings above 180 at dinnertime and most of these appear to be on dialysis days She said that she gives him a sandwich or a large snack when he comes back from dialysis on those days  After his last visit he was recommended taking Tradjenta regularly which she is giving him With this his blood sugars are somewhat better overall Not clear what his blood sugars are after dinner  Overall his appetite is fairly good  Fructosamine is relatively higher His  weight is overall about the same although tending to fluctuate   Dialysis is done at 12 pm, doing it on Monday/Wednesday/Fridays     Side effects from medications have been: None  Compliance with the medical regimen: Fair  Glucose monitoring:  done up to 3  times a day         Glucometer:  Freestyle  Blood Glucose readings by review of monitor Download and averages:   PRE-MEAL 10-11 AM Lunch Dinner Bedtime Overall  Glucose range: 88-145  91-226    Mean/median:   154  143   POST-MEAL PC Breakfast PC Lunch PC Dinner  Glucose range:     Mean/median:      Previously:  PRE-MEAL Fasting Lunch Dinner Bedtime Overall  Glucose range:  107 74-273    Mean/median:   177  174   POST-MEAL PC Breakfast PC Lunch PC Dinner  Glucose range:   ?  Mean/median:        Self-care: The diet that the patient has been following is: tries to limit sweets, portions. eating out only about once a week at a steak house        Typical meal intake: Breakfast is variable, ham biscuit; supper 7-8 pm            Dietician visit, most recent: 08/2017   Weight history:   Wt Readings from Last 3 Encounters:  11/16/21 144 lb 9.6 oz (65.6 kg)  11/06/21 145 lb 8.1 oz (66 kg)  11/03/21 147 lb (66.7 kg)     Glycemic control:    Lab Results  Component Value Date   HGBA1C 6.1 (H) 10/04/2021   HGBA1C 5.8 (H) 10/02/2021   HGBA1C 4.6 (L) 07/04/2021   Lab Results  Component Value Date   MICROALBUR 37.2 09/03/2016   LDLCALC 15 05/09/2021   CREATININE 10.01 (H) 11/06/2021    Lab Results  Component Value Date   FRUCTOSAMINE 359 (H) 11/14/2021   FRUCTOSAMINE 329 (H) 09/05/2021   FRUCTOSAMINE 306 (H) 05/09/2021    PROBLEM 2 Osteoporosis: See review of systems     Allergies as of 11/16/2021       Reactions   Ambien [zolpidem Tartrate] Other (See Comments)   Hallucinations and "felt crazy"    Penicillins Rash, Hives   Has patient had a PCN reaction causing immediate rash, facial/tongue/throat  swelling, SOB or lightheadedness with hypotension: Yes Has patient had a PCN reaction causing severe rash involving mucus membranes or skin necrosis: Yes Has patient had a PCN reaction that required hospitalization: No Has patient had a PCN reaction occurring within the last 10 years: No If all of the above answers are "NO", then may proceed with Cephalosporin use. Other reaction(s): HIVES        Medication List        Accurate as of November 16, 2021 11:59 PM. If you have any questions, ask your nurse or doctor.          acetaminophen 325 MG tablet Commonly known as: TYLENOL Take 1-2 tablets (325-650 mg total) by mouth every 4 (four) hours as needed for mild pain.   albuterol 108 (90 Base) MCG/ACT inhaler Commonly known as: VENTOLIN HFA Inhale 1 puff into the lungs daily as needed for wheezing or shortness of breath.   allopurinol 100 MG tablet Commonly known as: ZYLOPRIM Take 100 mg by mouth daily.   camphor-menthol lotion Commonly known as: SARNA Apply 1 application topically 4 (four) times daily as needed for itching.   cetirizine 10 MG tablet Commonly known as: ZYRTEC Take 10 mg by mouth daily as needed for allergies.   clobetasol 0.05 % external solution Commonly known as: TEMOVATE Apply 1 application topically 2 (two) times daily as needed (scalp irritation.).   donepezil 10 MG tablet Commonly known as: ARICEPT Take 1 tablet (10 mg total) by mouth at bedtime.   feeding supplement (NEPRO CARB STEADY) Liqd Take 237 mLs by mouth daily.   Fluocinolone Acetonide Scalp 0.01 % Oil Apply 1 application topically daily as needed (scalp irritation.).   fluticasone 50 MCG/ACT nasal spray Commonly known as: FLONASE Place 1 spray into both nostrils daily. What changed:  when to take this reasons to take this   Forteo 600 MCG/2.4ML Sopn Generic drug: Teriparatide (Recombinant) Inject 20 mcg subcutaneously daily What changed:  how much to take how to take  this when to take this additional instructions   FreeStyle Freedom Lite w/Device Kit Use to check blood sugar 2 times per day dx code E11.65   freestyle lancets Use as instructed to check blood sugar 2 times per day dx code E11.65   FREESTYLE LITE test strip Generic drug: glucose blood USE AS DIRECTED THREE TIMES DAILY What changed: additional instructions   hydrocortisone 2.5 % cream Apply 1 application topically 3 (three) times daily as needed (skin irritation (legs & arms)).   ketoconazole 2 % shampoo Commonly known  as: Nizoral Apply 1 application topically 2 (two) times a week.   levothyroxine 50 MCG tablet Commonly known as: SYNTHROID Take 1 tablet (50 mcg total) by mouth daily. What changed: when to take this   lidocaine 5 % Commonly known as: LIDODERM Place 1 patch onto the skin daily as needed (back pain).   memantine 10 MG tablet Commonly known as: NAMENDA Take 1 tablet (10 mg total) by mouth 2 (two) times daily.   midodrine 10 MG tablet Commonly known as: PROAMATINE Take one pill prior to hemodialysis on MWF What changed:  how much to take how to take this when to take this additional instructions   omeprazole 40 MG capsule Commonly known as: PRILOSEC Take 1 capsule (40 mg total) by mouth daily.   Pen Needles 32G X 4 MM Misc 1 each by Does not apply route 3 (three) times daily. USE PEN NEEDLES TO INJECT INSULIN THREE TIMES DAILY.   sevelamer carbonate 800 MG tablet Commonly known as: RENVELA Take 1,600-2,400 mg by mouth See admin instructions. Take 2,400 mg by mouth three times a day with meals and 1,600 mg with snacks per spouse   simvastatin 20 MG tablet Commonly known as: ZOCOR Take 20 mg by mouth at bedtime.   Theratears 0.25 % Soln Generic drug: Carboxymethylcellulose Sodium Place 1 drop into both eyes daily as needed (for dryness).   Tradjenta 5 MG Tabs tablet Generic drug: linagliptin Take 1 tablet (5 mg total) by mouth daily.         Allergies:  Allergies  Allergen Reactions   Ambien [Zolpidem Tartrate] Other (See Comments)    Hallucinations and "felt crazy"    Penicillins Rash and Hives    Has patient had a PCN reaction causing immediate rash, facial/tongue/throat swelling, SOB or lightheadedness with hypotension: Yes Has patient had a PCN reaction causing severe rash involving mucus membranes or skin necrosis: Yes Has patient had a PCN reaction that required hospitalization: No Has patient had a PCN reaction occurring within the last 10 years: No If all of the above answers are "NO", then may proceed with Cephalosporin use.  Other reaction(s): HIVES    Past Medical History:  Diagnosis Date   Allergy    Alzheimer's disease ([place]) 01/19/2021   Anemia    Arthritis    Cataract    bil cateracts removed   Coronary artery disease    Dementia arising in the senium and presenium ([place])    Diabetes mellitus    Type 2   Diverticulitis    ED (erectile dysfunction)    Elevated homocysteine    ESRD (end stage renal disease) on dialysis ([place]) 03/2015   M-W-F dialysis   GERD (gastroesophageal reflux disease)    pepto    Gout    Hiatal hernia    Hyperlipidemia    Hypertension    Hypothyroidism    PVD (peripheral vascular disease) ([place])    has plastic aorta   Renal insufficiency    Sleep apnea    does not wear c-pap    Past Surgical History:  Procedure Laterality Date   ANGIOPLASTY Right 01/26/2021   Procedure: ANGIOPLASTY RIGHT SUBCLAVIAN VEIN;  Surgeon: Serafina Mitchell, MD;  [facility];  Service: Vascular;  Laterality: Right;   aortobifemoral bypass     AV FISTULA PLACEMENT Left 12/01/2013   Procedure: ARTERIOVENOUS (AV) FISTULA CREATION- LEFT BRACHIOCEPHALIC;  Surgeon: Angelia Mould, MD;  Location: [place];  Service: Vascular;  Laterality: Left;   BASCILIC  VEIN TRANSPOSITION Right 07/27/2014   Procedure: BASCILIC VEIN TRANSPOSITION;  Surgeon: Angelia Mould, MD;  Location: [place];   Service: Vascular;  Laterality: Right;   BRAIN SURGERY  07/22/2018   BREAST SURGERY     left - granulomatous mastitis   BURR HOLE Bilateral 07/22/2018   Procedure: BILATERAL BURR HOLES;  Surgeon: Kristeen Miss, MD;  Location: [place];  Service: Neurosurgery;  Laterality: Bilateral;   COLONOSCOPY     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 ([place] [facility])  1992   ENTEROSCOPY N/A 06/03/2018   Procedure: ENTEROSCOPY;  Surgeon: Lavena Bullion, DO;  [facility]: [facility];  Service: Gastroenterology;  Laterality: N/A;   ENTEROSCOPY N/A 11/20/2018   Procedure: ENTEROSCOPY;  Surgeon: Rush Landmark Telford Nab., MD;  Location: [place];  Service: Gastroenterology;  Laterality: N/A;   ENTEROSCOPY N/A 12/03/2019   Procedure: ENTEROSCOPY;  Surgeon: Lavena Bullion, DO;  Location: [facility];  Service: Gastroenterology;  Laterality: N/A;  push enteroscopy   ENTEROSCOPY N/A 07/04/2021   Procedure: ENTEROSCOPY;  Surgeon: Sharyn Creamer, MD;  Location: [facility] END[facility]COPY;  Service: Gastroenterology;  Laterality: N/A;   ENTEROSCOPY N/A 11/05/2021   Procedure: ENTEROSCOPY;  Surgeon: Daryel November, MD;  Location: [facility] ENDOSCOPY;  Service: Gastroenterology;  Laterality: N/A;   EYE SURGERY Bilateral    cataracts   FISTULOGRAM Right 01/26/2021   Procedure: FISTULOGRAM RIGHT;  Surgeon: Serafina Mitchell, MD;  Location: [facility] OR;  Service: Vascular;  Laterality: Right;   HEMODIALYSIS INPATIENT  01/17/2018       HOT HEMOSTASIS N/A 06/03/2018   Procedure: HOT HEMOSTASIS (ARGON PLASMA COAGULATION/BICAP);  Surgeon: Lavena Bullion, DO;  Location: [facility] ENDOSCOPY;  Service: Gastroenterology;  Laterality: N/A;   HOT HEMOSTASIS N/A 11/20/2018   Procedure: HOT HEMOSTASIS (ARGON PLASMA COAGULATION/BICAP);  Surgeon: Irving Copas., MD;  Location: [place];  Service: Gastroenterology;  Laterality: N/A;   HOT HEMOSTASIS N/A 12/03/2019   Procedure: HOT HEMOSTASIS (ARGON PLASMA COAGULATION/BICAP);  Surgeon: Lavena Bullion, DO;   [facility];  Service: Gastroenterology;  Laterality: N/A;   INTRAMEDULLARY (IM) NAIL INTERTROCHANTERIC Right 11/08/2020   Procedure: INTRAMEDULLARY (IM) NAIL INTERTROCHANTRIC;  Surgeon: Erle Crocker, MD;  Location: [place];  Service: Orthopedics;  Laterality: Right;   REVISION OF ARTERIOVENOUS GORETEX GRAFT Right 09/01/2019   Procedure: REVISION OF ARTERIOVENOUS FISTULA RIGHT ARM;  Surgeon: Serafina Mitchell, MD;  Location: [place];  Service: Vascular;  Laterality: Right;   REVISON OF ARTERIOVENOUS FISTULA Left 02/09/2014   Procedure: REVISON OF LEFT ARTERIOVENOUS FISTULA - RESECTION OF RENDUNDANT VEIN;  Surgeon: Angelia Mould, MD;  Location: [place];  Service: Vascular;  Laterality: Left;   REVISON OF ARTERIOVENOUS FISTULA Right 01/26/2021   Procedure: REVISON OF ARTERIOVENOUS FISTULA RIGHT;  Surgeon: Serafina Mitchell, MD;  Location: [place];  Service: Vascular;  Laterality: Right;   SBO with lysis adhesions     SHUNTOGRAM Left 04/19/2014   Procedure: FISTULOGRAM;  Surgeon: Angelia Mould, MD;  Location: [facility] CATH LAB;  Service: Cardiovascular;  Laterality: Left;   UNILATERAL UPPER EXTREMEITY ANGIOGRAM N/A 07/12/2014   Procedure: UNILATERAL UPPER Anselmo Rod;  Surgeon: Angelia Mould, MD;  Location: [facility] CATH LAB;  Service: Cardiovascular;  Laterality: N/A;    Family History  Problem Relation Age of Onset   Aneurysm Mother        brain   Heart disease Father    Stroke Father    Hypertension Father    Diabetes Father  Dementia Neg Hx    Colon cancer Neg Hx    Esophageal cancer Neg Hx    Pancreatic cancer Neg Hx    Prostate cancer Neg Hx    Rectal cancer Neg Hx    Stomach cancer Neg Hx     Social History:  reports that he quit smoking about 27 years ago. His smoking use included cigarettes. He has never used smokeless tobacco. He reports that he does not drink alcohol and does not use drugs.    Review of Systems      OSTEOPOROSIS:  He has had  a history of 2 vertebral fractures He had rib fractures after his fall in October 2019 His T score at the radius was -4.6 and unable to read his hips and spine on the bone densitometry  He had been on Prolia since 5/19 and last injection was in 05/19/20  After discussion with nephrologist even though he has mild secondary hyperparathyroidism he has been on FORTEO since 7/22 He is injecting it daily without side effects  Calcium previously has been variable, also followed by nephrologist Calcium is now consistently normal  PTH values not available from nephrology office  Lab Results  Component Value Date   PTH 154 (H) 10/06/2014   CALCIUM 9.6 11/14/2021   CAION 1.25 10/02/2021   PHOS 4.1 07/06/2021     Lipid history: He has been treated with Simvastatin 20 mg  by PCP, results as follows    Lab Results  Component Value Date   CHOL 81 05/09/2021   HDL 49.10 05/09/2021   [place] 15 05/09/2021   TRIG 85.0 05/09/2021   CHOLHDL 2 05/09/2021          He has ESRD on dialysis    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  Is on 50 mcg levothyroxine   Lab Results  Component Value Date   TSH 0.241 (L) 10/02/2021      Physical Examination:  BP (!) 112/48    Pulse 68    Ht 5' 11"  (1.803 m)    Wt 144 lb 9.6 oz (65.6 kg)    SpO2 97%    BMI 20.17 kg/m      ASSESSMENT:  Diabetes type 2  See history of present illness for discussion of his current management, blood sugar patterns and problems identified   His fructosamine is appearing higher at 359 A1c is 6.1 which is higher than usual also  Although considering his age and comorbid conditions his blood sugar readings are not unusually high he does periodically have readings below 200 Diet is generally better  PLAN:   His wife was instructed on cutting back on the carbohydrates for his snack when he comes back from dialysis such as giving only half sandwich  instead of a full sandwich or more protein along with his protein shake  However need to start checking blood sugars 2 hours after dinner consistently  If this is consistently over 180 he will start with 4 units of NovoLog at least at dinnertime  Continue Tradjenta  We will follow fructosamine levels since A1c is not accurate   Patient Instructions  Check blood sugars on waking up days a week  Also check blood sugars about 2 hours after meals and do this after different meals by rotation  Recommended blood sugar levels on waking up are 90-130 and about 2 hours after meal is 130-160  Please bring your blood sugar monitor to  each visit, thank you  4 Units Novolog at dinner if 2 hr sugar >180 consistently  Less Carbs with snack after dialysis     Elayne Snare 11/17/2021, 8:36 AM   Note: This office note was prepared with Dragon voice recognition system technology. Any transcriptional errors that result from this process are unintentional.

## 2021-11-17 ENCOUNTER — Telehealth: Payer: Self-pay | Admitting: Internal Medicine

## 2021-11-17 DIAGNOSIS — N186 End stage renal disease: Secondary | ICD-10-CM | POA: Diagnosis not present

## 2021-11-17 DIAGNOSIS — Z992 Dependence on renal dialysis: Secondary | ICD-10-CM | POA: Diagnosis not present

## 2021-11-17 DIAGNOSIS — A498 Other bacterial infections of unspecified site: Secondary | ICD-10-CM | POA: Diagnosis not present

## 2021-11-17 DIAGNOSIS — E876 Hypokalemia: Secondary | ICD-10-CM | POA: Diagnosis not present

## 2021-11-17 DIAGNOSIS — N2581 Secondary hyperparathyroidism of renal origin: Secondary | ICD-10-CM | POA: Diagnosis not present

## 2021-11-17 NOTE — Telephone Encounter (Signed)
Faxed signed Resumption of Care Orders to [facility].  Order # [id number]

## 2021-11-20 DIAGNOSIS — E876 Hypokalemia: Secondary | ICD-10-CM | POA: Diagnosis not present

## 2021-11-20 DIAGNOSIS — A498 Other bacterial infections of unspecified site: Secondary | ICD-10-CM | POA: Diagnosis not present

## 2021-11-20 DIAGNOSIS — Z992 Dependence on renal dialysis: Secondary | ICD-10-CM | POA: Diagnosis not present

## 2021-11-20 DIAGNOSIS — Z20822 Contact with and (suspected) exposure to covid-19: Secondary | ICD-10-CM | POA: Diagnosis not present

## 2021-11-20 DIAGNOSIS — N2581 Secondary hyperparathyroidism of renal origin: Secondary | ICD-10-CM | POA: Diagnosis not present

## 2021-11-20 DIAGNOSIS — N186 End stage renal disease: Secondary | ICD-10-CM | POA: Diagnosis not present

## 2021-11-22 DIAGNOSIS — E1122 Type 2 diabetes mellitus with diabetic chronic kidney disease: Secondary | ICD-10-CM | POA: Diagnosis not present

## 2021-11-22 DIAGNOSIS — D631 Anemia in chronic kidney disease: Secondary | ICD-10-CM | POA: Diagnosis not present

## 2021-11-22 DIAGNOSIS — I120 Hypertensive chronic kidney disease with stage 5 chronic kidney disease or end stage renal disease: Secondary | ICD-10-CM | POA: Diagnosis not present

## 2021-11-22 DIAGNOSIS — E1129 Type 2 diabetes mellitus with other diabetic kidney complication: Secondary | ICD-10-CM | POA: Diagnosis not present

## 2021-11-22 DIAGNOSIS — N186 End stage renal disease: Secondary | ICD-10-CM | POA: Diagnosis not present

## 2021-11-22 DIAGNOSIS — Z992 Dependence on renal dialysis: Secondary | ICD-10-CM | POA: Diagnosis not present

## 2021-11-22 DIAGNOSIS — G309 Alzheimer's disease, unspecified: Secondary | ICD-10-CM | POA: Diagnosis not present

## 2021-11-22 DIAGNOSIS — E876 Hypokalemia: Secondary | ICD-10-CM | POA: Diagnosis not present

## 2021-11-22 DIAGNOSIS — N2581 Secondary hyperparathyroidism of renal origin: Secondary | ICD-10-CM | POA: Diagnosis not present

## 2021-11-22 DIAGNOSIS — G9341 Metabolic encephalopathy: Secondary | ICD-10-CM | POA: Diagnosis not present

## 2021-11-22 DIAGNOSIS — F0280 Dementia in other diseases classified elsewhere without behavioral disturbance: Secondary | ICD-10-CM | POA: Diagnosis not present

## 2021-11-23 DIAGNOSIS — G9341 Metabolic encephalopathy: Secondary | ICD-10-CM | POA: Diagnosis not present

## 2021-11-23 DIAGNOSIS — F0280 Dementia in other diseases classified elsewhere without behavioral disturbance: Secondary | ICD-10-CM | POA: Diagnosis not present

## 2021-11-23 DIAGNOSIS — I120 Hypertensive chronic kidney disease with stage 5 chronic kidney disease or end stage renal disease: Secondary | ICD-10-CM | POA: Diagnosis not present

## 2021-11-23 DIAGNOSIS — N186 End stage renal disease: Secondary | ICD-10-CM | POA: Diagnosis not present

## 2021-11-23 DIAGNOSIS — E1122 Type 2 diabetes mellitus with diabetic chronic kidney disease: Secondary | ICD-10-CM | POA: Diagnosis not present

## 2021-11-23 DIAGNOSIS — G309 Alzheimer's disease, unspecified: Secondary | ICD-10-CM | POA: Diagnosis not present

## 2021-11-24 DIAGNOSIS — N186 End stage renal disease: Secondary | ICD-10-CM | POA: Diagnosis not present

## 2021-11-24 DIAGNOSIS — E876 Hypokalemia: Secondary | ICD-10-CM | POA: Diagnosis not present

## 2021-11-24 DIAGNOSIS — N2581 Secondary hyperparathyroidism of renal origin: Secondary | ICD-10-CM | POA: Diagnosis not present

## 2021-11-24 DIAGNOSIS — Z992 Dependence on renal dialysis: Secondary | ICD-10-CM | POA: Diagnosis not present

## 2021-11-24 DIAGNOSIS — E1129 Type 2 diabetes mellitus with other diabetic kidney complication: Secondary | ICD-10-CM | POA: Diagnosis not present

## 2021-11-27 DIAGNOSIS — E876 Hypokalemia: Secondary | ICD-10-CM | POA: Diagnosis not present

## 2021-11-27 DIAGNOSIS — N2581 Secondary hyperparathyroidism of renal origin: Secondary | ICD-10-CM | POA: Diagnosis not present

## 2021-11-27 DIAGNOSIS — Z992 Dependence on renal dialysis: Secondary | ICD-10-CM | POA: Diagnosis not present

## 2021-11-27 DIAGNOSIS — E1129 Type 2 diabetes mellitus with other diabetic kidney complication: Secondary | ICD-10-CM | POA: Diagnosis not present

## 2021-11-27 DIAGNOSIS — N186 End stage renal disease: Secondary | ICD-10-CM | POA: Diagnosis not present

## 2021-11-29 DIAGNOSIS — Z20828 Contact with and (suspected) exposure to other viral communicable diseases: Secondary | ICD-10-CM | POA: Diagnosis not present

## 2021-11-29 DIAGNOSIS — E876 Hypokalemia: Secondary | ICD-10-CM | POA: Diagnosis not present

## 2021-11-29 DIAGNOSIS — Z992 Dependence on renal dialysis: Secondary | ICD-10-CM | POA: Diagnosis not present

## 2021-11-29 DIAGNOSIS — N2581 Secondary hyperparathyroidism of renal origin: Secondary | ICD-10-CM | POA: Diagnosis not present

## 2021-11-29 DIAGNOSIS — N186 End stage renal disease: Secondary | ICD-10-CM | POA: Diagnosis not present

## 2021-11-29 DIAGNOSIS — E1129 Type 2 diabetes mellitus with other diabetic kidney complication: Secondary | ICD-10-CM | POA: Diagnosis not present

## 2021-11-30 DIAGNOSIS — F0280 Dementia in other diseases classified elsewhere without behavioral disturbance: Secondary | ICD-10-CM | POA: Diagnosis not present

## 2021-11-30 DIAGNOSIS — G309 Alzheimer's disease, unspecified: Secondary | ICD-10-CM | POA: Diagnosis not present

## 2021-11-30 DIAGNOSIS — G9341 Metabolic encephalopathy: Secondary | ICD-10-CM | POA: Diagnosis not present

## 2021-11-30 DIAGNOSIS — S51801A Unspecified open wound of right forearm, initial encounter: Secondary | ICD-10-CM | POA: Diagnosis not present

## 2021-11-30 DIAGNOSIS — N186 End stage renal disease: Secondary | ICD-10-CM | POA: Diagnosis not present

## 2021-11-30 DIAGNOSIS — X58XXXA Exposure to other specified factors, initial encounter: Secondary | ICD-10-CM | POA: Diagnosis not present

## 2021-11-30 DIAGNOSIS — E1122 Type 2 diabetes mellitus with diabetic chronic kidney disease: Secondary | ICD-10-CM | POA: Diagnosis not present

## 2021-11-30 DIAGNOSIS — I120 Hypertensive chronic kidney disease with stage 5 chronic kidney disease or end stage renal disease: Secondary | ICD-10-CM | POA: Diagnosis not present

## 2021-12-01 DIAGNOSIS — N2581 Secondary hyperparathyroidism of renal origin: Secondary | ICD-10-CM | POA: Diagnosis not present

## 2021-12-01 DIAGNOSIS — N186 End stage renal disease: Secondary | ICD-10-CM | POA: Diagnosis not present

## 2021-12-01 DIAGNOSIS — E876 Hypokalemia: Secondary | ICD-10-CM | POA: Diagnosis not present

## 2021-12-01 DIAGNOSIS — E1129 Type 2 diabetes mellitus with other diabetic kidney complication: Secondary | ICD-10-CM | POA: Diagnosis not present

## 2021-12-01 DIAGNOSIS — Z992 Dependence on renal dialysis: Secondary | ICD-10-CM | POA: Diagnosis not present

## 2021-12-04 DIAGNOSIS — Z992 Dependence on renal dialysis: Secondary | ICD-10-CM | POA: Diagnosis not present

## 2021-12-04 DIAGNOSIS — N2581 Secondary hyperparathyroidism of renal origin: Secondary | ICD-10-CM | POA: Diagnosis not present

## 2021-12-04 DIAGNOSIS — N186 End stage renal disease: Secondary | ICD-10-CM | POA: Diagnosis not present

## 2021-12-04 DIAGNOSIS — E876 Hypokalemia: Secondary | ICD-10-CM | POA: Diagnosis not present

## 2021-12-04 DIAGNOSIS — E1129 Type 2 diabetes mellitus with other diabetic kidney complication: Secondary | ICD-10-CM | POA: Diagnosis not present

## 2021-12-06 DIAGNOSIS — E1129 Type 2 diabetes mellitus with other diabetic kidney complication: Secondary | ICD-10-CM | POA: Diagnosis not present

## 2021-12-06 DIAGNOSIS — N186 End stage renal disease: Secondary | ICD-10-CM | POA: Diagnosis not present

## 2021-12-06 DIAGNOSIS — N2581 Secondary hyperparathyroidism of renal origin: Secondary | ICD-10-CM | POA: Diagnosis not present

## 2021-12-06 DIAGNOSIS — Z992 Dependence on renal dialysis: Secondary | ICD-10-CM | POA: Diagnosis not present

## 2021-12-06 DIAGNOSIS — E876 Hypokalemia: Secondary | ICD-10-CM | POA: Diagnosis not present

## 2021-12-08 DIAGNOSIS — E876 Hypokalemia: Secondary | ICD-10-CM | POA: Diagnosis not present

## 2021-12-08 DIAGNOSIS — E1129 Type 2 diabetes mellitus with other diabetic kidney complication: Secondary | ICD-10-CM | POA: Diagnosis not present

## 2021-12-08 DIAGNOSIS — Z992 Dependence on renal dialysis: Secondary | ICD-10-CM | POA: Diagnosis not present

## 2021-12-08 DIAGNOSIS — N2581 Secondary hyperparathyroidism of renal origin: Secondary | ICD-10-CM | POA: Diagnosis not present

## 2021-12-08 DIAGNOSIS — N186 End stage renal disease: Secondary | ICD-10-CM | POA: Diagnosis not present

## 2021-12-11 DIAGNOSIS — E876 Hypokalemia: Secondary | ICD-10-CM | POA: Diagnosis not present

## 2021-12-11 DIAGNOSIS — Z992 Dependence on renal dialysis: Secondary | ICD-10-CM | POA: Diagnosis not present

## 2021-12-11 DIAGNOSIS — N186 End stage renal disease: Secondary | ICD-10-CM | POA: Diagnosis not present

## 2021-12-11 DIAGNOSIS — E1129 Type 2 diabetes mellitus with other diabetic kidney complication: Secondary | ICD-10-CM | POA: Diagnosis not present

## 2021-12-11 DIAGNOSIS — N2581 Secondary hyperparathyroidism of renal origin: Secondary | ICD-10-CM | POA: Diagnosis not present

## 2021-12-12 ENCOUNTER — Other Ambulatory Visit: Payer: Medicare Other

## 2021-12-12 ENCOUNTER — Ambulatory Visit (INDEPENDENT_AMBULATORY_CARE_PROVIDER_SITE_OTHER): Payer: Medicare Other | Admitting: Gastroenterology

## 2021-12-12 ENCOUNTER — Ambulatory Visit: Payer: TRICARE For Life (TFL) | Admitting: Dietician

## 2021-12-12 ENCOUNTER — Encounter: Payer: Self-pay | Admitting: Gastroenterology

## 2021-12-12 ENCOUNTER — Other Ambulatory Visit (INDEPENDENT_AMBULATORY_CARE_PROVIDER_SITE_OTHER): Payer: Medicare Other

## 2021-12-12 ENCOUNTER — Other Ambulatory Visit: Payer: Self-pay

## 2021-12-12 VITALS — BP 142/70 | HR 60 | Ht 71.0 in | Wt 141.5 lb

## 2021-12-12 DIAGNOSIS — R194 Change in bowel habit: Secondary | ICD-10-CM | POA: Diagnosis not present

## 2021-12-12 DIAGNOSIS — R1032 Left lower quadrant pain: Secondary | ICD-10-CM

## 2021-12-12 DIAGNOSIS — R159 Full incontinence of feces: Secondary | ICD-10-CM | POA: Diagnosis not present

## 2021-12-12 DIAGNOSIS — K5520 Angiodysplasia of colon without hemorrhage: Secondary | ICD-10-CM

## 2021-12-12 DIAGNOSIS — R152 Fecal urgency: Secondary | ICD-10-CM

## 2021-12-12 DIAGNOSIS — R197 Diarrhea, unspecified: Secondary | ICD-10-CM

## 2021-12-12 DIAGNOSIS — A09 Infectious gastroenteritis and colitis, unspecified: Secondary | ICD-10-CM | POA: Diagnosis not present

## 2021-12-12 LAB — CBC
HCT: 32.3 % — ABNORMAL LOW (ref 39.0–52.0)
Hemoglobin: 10.8 g/dL — ABNORMAL LOW (ref 13.0–17.0)
MCHC: 33.5 g/dL (ref 30.0–36.0)
MCV: 104.5 fl — ABNORMAL HIGH (ref 78.0–100.0)
Platelets: 163 10*3/uL (ref 150.0–400.0)
RBC: 3.09 Mil/uL — ABNORMAL LOW (ref 4.22–5.81)
RDW: 16 % — ABNORMAL HIGH (ref 11.5–15.5)
WBC: 9 10*3/uL (ref 4.0–10.5)

## 2021-12-12 MED ORDER — DICYCLOMINE HCL 10 MG PO CAPS: 10.0000 mg | ORAL_CAPSULE | Freq: Three times a day (TID) | ORAL | 3 refills | Status: DC | PRN

## 2021-12-12 NOTE — Patient Instructions (Addendum)
If you are age 81 or older, your body mass index should be between 23-30. Your Body mass index is 19.74 kg/m?Marland Kitchen If this is out of the aforementioned range listed, please consider follow up with your Primary Care Provider. ? ?__________________________________________________________ ?The [facility] providers would like to encourage you to use [facility] to communicate with providers for non-urgent requests or questions.  Due to long hold times on the telephone, sending your provider a message by [facility] may be a faster and more efficient way to get a response.  Please allow 48 business hours for a response.  Please remember that this is for non-urgent requests.   ? ?Due to recent changes in healthcare laws, you may see the results of your imaging and laboratory studies on MyChart before your provider has had a chance to review them.  We understand that in some cases there may be results that are confusing or concerning to you. Not all laboratory results come back in the same time frame and the provider may be waiting for multiple results in order to interpret others.  Please give [place] 48 hours in order for your provider to thoroughly review all the results before contacting the office for clarification of your results.   ? ?Please go to the lab on the 2nd floor [address] before you leave the office today.  ? ?We have sent the following medications to your pharmacy for you to pick up at your convenience: Bentyl ? ?Please purchase the following medications over the counter and take as directed: Benefiber, Probiotic ? ? ?Thank you for choosing me and [facility]. ? ?Gerrit Heck, D.O.  ? ? ?We want to thank you for trusting [facility] with your care. All of our staff and providers value the relationships we have built with our patients, and it is an honor to care for you.  ? ?We are writing to let you know that [facility] [place] will close on Feb 05, 2022, and we  invite you to continue to see Dr. Carmell Austria and Gerrit Heck at the [facility] [place] office location. We are consolidating our serices at these [facility] practices to better provide care. Our office staff will work with you to ensure a seamless transition.  ? ?Gerrit Heck, DO -Dr. Bryan Lemma will be movig to [facility] at [address], [place], [place] [address], effective Feb 05, 2022.  Contact [phone] to schedule an appointment with him.  ? ?Carmell Austria, MD- Dr. Lyndel Safe will be movig to [facility] at [address], [place], [place] [address], effective Feb 05, 2022.  Contact [phone] to schedule an appointment with him.  ? ?Requesting Medical Records ?If you need to request your medical records, please follow the instructions below. Your medical records are confidential, and a copy can be transferred to another provider or released to you or another person you designate only with your permission. ? ?There are several ways to request your medical records: ?Requests for medical records can be submitted through our practice.   ?You can also request your records electronically, in your MyChart account by selecting the ?Request Health Records? tab.  ?If you need additional information on how to request records, please go to [email or web address]/, choose Patient Information, then select Request Medical Records. ?To make an appointment or if you have any questions about your health care needs, please contact our office at [phone] and one of our staff members will be glad to assist you. ?  [facility] is committed to providing exceptional care for you and our community. Thank you for allowing [place] to serve your health care needs. ?Sincerely, ? ?Windy Canny, Director [facility] ?[facility] also offers convenient virtual care options. Sore throat? Sinus problems? Cold or flu symptoms? Get care from the comfort of home with [facility] Video  Visits and e-Visits. Learn more about the non-emergency conditions treated and start your virtual visit at [email or web address] ? ?

## 2021-12-12 NOTE — Progress Notes (Signed)
? ?Chief Complaint:    Hospital follow-up, left lower abdominal pain ? ?GI History: [name] is an 81 y.o. male.  Hx ESRD on hemodialysis.  CAD.  DM 2.  Hypothyroidism.  Dementia, hypertension.  Diverticulitis.   AAA repair 1992.  Subdural hematoma evacuation 2019.  Anemia of chronic dz and of GI blood loss, PRBCs in 05/2018, 10/2018.   ?History of GI bleeds manifesting as melena and acute on chronic anemia dating back to 2019 with findings of upper intestinal AVMs. ?  ?10/2017 EGD.  Nonobstructing, mild distal esophageal stricture was dilated.  Otherwise normal study. ?10/2017 colonoscopy.  Tubular adenomatous polyp removed from transverse colon, hyperplastic polyp removed from rectosigmoid.  Solitary, nonbleeding erosion at transverse colon with no stigmata of or active bleeding.  Scattered small and large diverticula throughout the colon.  Nonbleeding, small internal hemorrhoids ?05/2018 SBE: AVM with stigmata of recent bleeding at gastric fundis treated with APC. ?10/2018 SBE. For anemia, obscure GI bleeding, history of gastric AVM.  Findings of a single, nonbleeding gastric AVM treated with APC.  Gastric erythema.  2 spots of oozing in the duodenum treated with APC.  3, nonbleeding AVMs in jejunum treated with APC.  Tattoo placed at distal extent of exam in jejunum ?02/2019 capsule endoscopy.  No active bleeding or oozing.  There were few tiny red spots, possible AVMs in mid small bowel at 2 hours 30 minutes.  Distal, nonbleeding, small bowel AVMs at 7 hours 13 minutes ?11/2019 SBE.  Sliding hiatal hernia.  Nonobstructing Schatzki's ring.  2, nonbleeding, AVMs in third and fourth duodenum treated with APC.  3, nonbleeding, AVMs in proximal jejunum treated with APC.  Previously placed tattoos visualized. ?06/2021 push enteroscopy Nonbleeding gastric AVM, AVM in D4, 2 AVMs in proximal jejunum all treated with APC.  Started on octreotide 50 mcg bid ? ?HPI:   ? ? ?Patient is a 81 y.o. male presenting to the  [facility] for follow-up.  He was last seen by me on 11/03/2021 for hospital follow-up in January with acute metabolic encephalopathy thought 2/2 recently starting Korsuva, also complicated by underlying dementia, uremia (BUN 63), possible UTI. ? ?At that time, did report darkened stool, but not melena.  Still with L>R lower abdominal pain, which was evaluated during his recent hospital course (CT abdomen/pelvis unremarkable x2). ?- Prescribed octreotide 50 mcg bid and submitted paperwork for octreotide LAR 20 mg/month ?- Left hip x-ray unremarkable ? ?He was readmitted 2/11-13 with left-sided abdominal pain. ?- Hgb 9.6 (from 10.6).  FOBT negative ?- 11/06/2021: Push enteroscopy unremarkable.  Recommended conservative GI measures moving forward ?- Started on octreotide LAR 20 mg IM ?- Treated with empiric Rocephin and fosfomycin for possible UTI ?- Received HD.  Plan for IV iron and ESA with HD as needed ?- Discharge H/H 9.3/27 point ? ?His main issue today is continued left lower abdominal pain.  Still occurs intermittently.  Has been evaluated with CT x2, x-ray, hospital admission; all unremarkable. ? ?Has 3-4 BM/day. Has had episodes of urge fecal incontinence.  Loose stools, not watery or bloody.  Per his wife, she thinks the loose stools have been for the last 4 weeks or so, and she thinks possibly following octreotide IM injection.  Has nocturnal stools.  ? ?His wife assists in history due to dementia. ? ?Review of systems:     No chest pain, no SOB, no fevers, no urinary sx  ? ?Past Medical History:  ?Diagnosis Date  ? Allergy   ?  Alzheimer's disease ([place]) 01/19/2021  ? Anemia   ? Arthritis   ? Cataract   ? bil cateracts removed  ? Coronary artery disease   ? Dementia arising in the senium and presenium ([place])   ? Diabetes mellitus   ? Type 2  ? Diverticulitis   ? ED (erectile dysfunction)   ? Elevated homocysteine   ? ESRD (end stage renal disease) on dialysis ([place]) 03/2015  ? M-W-F dialysis  ?  GERD (gastroesophageal reflux disease)   ? pepto   ? Gout   ? Hiatal hernia   ? Hyperlipidemia   ? Hypertension   ? Hypothyroidism   ? PVD (peripheral vascular disease) ([place])   ? has plastic aorta  ? Renal insufficiency   ? Sleep apnea   ? does not wear c-pap  ? ? ?Patient's surgical history, family medical history, social history, medications and allergies were all reviewed in Epic  ? ? ?Current Outpatient Medications  ?Medication Sig Dispense Refill  ? acetaminophen (TYLENOL) 325 MG tablet Take 1-2 tablets (325-650 mg total) by mouth every 4 (four) hours as needed for mild pain.    ? albuterol (VENTOLIN HFA) 108 (90 Base) MCG/ACT inhaler Inhale 1 puff into the lungs daily as needed for wheezing or shortness of breath.    ? allopurinol (ZYLOPRIM) 100 MG tablet Take 100 mg by mouth daily.    ? Blood Glucose Monitoring Suppl (FREESTYLE FREEDOM LITE) w/Device KIT Use to check blood sugar 2 times per day dx code E11.65 1 each 0  ? camphor-menthol (SARNA) lotion Apply 1 application topically 4 (four) times daily as needed for itching.     ? Carboxymethylcellulose Sodium (THERATEARS) 0.25 % SOLN Place 1 drop into both eyes daily as needed (for dryness).    ? cetirizine (ZYRTEC) 10 MG tablet Take 10 mg by mouth daily as needed for allergies.    ? clobetasol (TEMOVATE) 0.05 % external solution Apply 1 application topically 2 (two) times daily as needed (scalp irritation.).   0  ? donepezil (ARICEPT) 10 MG tablet Take 1 tablet (10 mg total) by mouth at bedtime. 90 tablet 0  ? FLUOCINOLONE ACETONIDE SCALP 0.01 % OIL Apply 1 application topically daily as needed (scalp irritation.).   0  ? fluticasone (FLONASE) 50 MCG/ACT nasal spray Place 1 spray into both nostrils daily. (Patient taking differently: Place 1 spray into both nostrils daily as needed for allergies.) 16 g 2  ? glucose blood (FREESTYLE LITE) test strip USE AS DIRECTED THREE TIMES DAILY (Patient taking differently: Check blood sugar bid) 300 strip 2  ?  hydrocortisone 2.5 % cream Apply 1 application topically 3 (three) times daily as needed (skin irritation (legs & arms)).     ? Insulin Pen Needle (PEN NEEDLES) 32G X 4 MM MISC 1 each by Does not apply route 3 (three) times daily. USE PEN NEEDLES TO INJECT INSULIN THREE TIMES DAILY. 100 each 12  ? ketoconazole (NIZORAL) 2 % shampoo Apply 1 application topically 2 (two) times a week. (Patient not taking: Reported on 11/16/2021) 120 mL 0  ? Lancets (FREESTYLE) lancets Use as instructed to check blood sugar 2 times per day dx code E11.65 100 each 3  ? levothyroxine (SYNTHROID, LEVOTHROID) 50 MCG tablet Take 1 tablet (50 mcg total) by mouth daily. (Patient taking differently: Take 50 mcg by mouth daily before breakfast.) 90 tablet 1  ? lidocaine (LIDODERM) 5 % Place 1 patch onto the skin daily as needed (back pain).    ?  memantine (NAMENDA) 10 MG tablet Take 1 tablet (10 mg total) by mouth 2 (two) times daily. 180 tablet 0  ? midodrine (PROAMATINE) 10 MG tablet Take one pill prior to hemodialysis on MWF (Patient taking differently: Take 5 mg by mouth See admin instructions. Take one tablet by mouth prior to hemodialysis on MWF)    ? Nutritional Supplements (FEEDING SUPPLEMENT, NEPRO CARB STEADY,) LIQD Take 237 mLs by mouth daily.    ? omeprazole (PRILOSEC) 40 MG capsule Take 1 capsule (40 mg total) by mouth daily. 30 capsule 0  ? sevelamer carbonate (RENVELA) 800 MG tablet Take 1,600-2,400 mg by mouth See admin instructions. Take 2,400 mg by mouth three times a day with meals and 1,600 mg with snacks per spouse    ? simvastatin (ZOCOR) 20 MG tablet Take 20 mg by mouth at bedtime.    ? Teriparatide, Recombinant, (FORTEO) 600 MCG/2.4ML SOPN Inject 20 mcg subcutaneously daily (Patient taking differently: Inject 20 mcg into the skin at bedtime.) 7.2 mL 1  ? TRADJENTA 5 MG TABS tablet Take 1 tablet (5 mg total) by mouth daily. 90 tablet 3  ? ?No current facility-administered medications for this visit.  ? ? ?Physical Exam:    ? ? ?There were no vitals taken for this visit. ? ?GENERAL:  Pleasant male in NAD, sitting in chair ?PSYCH: : Cooperative, normal affect ?ABDOMEN: Mild TTP in left abdomen.  No rebound or guarding.  No peritoneal signs.

## 2021-12-13 DIAGNOSIS — N186 End stage renal disease: Secondary | ICD-10-CM | POA: Diagnosis not present

## 2021-12-13 DIAGNOSIS — E1129 Type 2 diabetes mellitus with other diabetic kidney complication: Secondary | ICD-10-CM | POA: Diagnosis not present

## 2021-12-13 DIAGNOSIS — N2581 Secondary hyperparathyroidism of renal origin: Secondary | ICD-10-CM | POA: Diagnosis not present

## 2021-12-13 DIAGNOSIS — Z992 Dependence on renal dialysis: Secondary | ICD-10-CM | POA: Diagnosis not present

## 2021-12-13 DIAGNOSIS — E876 Hypokalemia: Secondary | ICD-10-CM | POA: Diagnosis not present

## 2021-12-13 LAB — CLOSTRIDIUM DIFFICILE TOXIN B, QUALITATIVE, REAL-TIME PCR: Toxigenic C. Difficile by PCR: NOT DETECTED

## 2021-12-14 LAB — GI PROFILE, STOOL, PCR

## 2021-12-15 DIAGNOSIS — N2581 Secondary hyperparathyroidism of renal origin: Secondary | ICD-10-CM | POA: Diagnosis not present

## 2021-12-15 DIAGNOSIS — E1129 Type 2 diabetes mellitus with other diabetic kidney complication: Secondary | ICD-10-CM | POA: Diagnosis not present

## 2021-12-15 DIAGNOSIS — E876 Hypokalemia: Secondary | ICD-10-CM | POA: Diagnosis not present

## 2021-12-15 DIAGNOSIS — Z992 Dependence on renal dialysis: Secondary | ICD-10-CM | POA: Diagnosis not present

## 2021-12-15 DIAGNOSIS — N186 End stage renal disease: Secondary | ICD-10-CM | POA: Diagnosis not present

## 2021-12-16 LAB — CALPROTECTIN, FECAL: Calprotectin, Fecal: 71 ug/g (ref 0–120)

## 2021-12-18 DIAGNOSIS — N186 End stage renal disease: Secondary | ICD-10-CM | POA: Diagnosis not present

## 2021-12-18 DIAGNOSIS — N2581 Secondary hyperparathyroidism of renal origin: Secondary | ICD-10-CM | POA: Diagnosis not present

## 2021-12-18 DIAGNOSIS — E1129 Type 2 diabetes mellitus with other diabetic kidney complication: Secondary | ICD-10-CM | POA: Diagnosis not present

## 2021-12-18 DIAGNOSIS — E876 Hypokalemia: Secondary | ICD-10-CM | POA: Diagnosis not present

## 2021-12-18 DIAGNOSIS — Z992 Dependence on renal dialysis: Secondary | ICD-10-CM | POA: Diagnosis not present

## 2021-12-20 DIAGNOSIS — E876 Hypokalemia: Secondary | ICD-10-CM | POA: Diagnosis not present

## 2021-12-20 DIAGNOSIS — E1129 Type 2 diabetes mellitus with other diabetic kidney complication: Secondary | ICD-10-CM | POA: Diagnosis not present

## 2021-12-20 DIAGNOSIS — N2581 Secondary hyperparathyroidism of renal origin: Secondary | ICD-10-CM | POA: Diagnosis not present

## 2021-12-20 DIAGNOSIS — Z992 Dependence on renal dialysis: Secondary | ICD-10-CM | POA: Diagnosis not present

## 2021-12-20 DIAGNOSIS — N186 End stage renal disease: Secondary | ICD-10-CM | POA: Diagnosis not present

## 2021-12-22 DIAGNOSIS — Z992 Dependence on renal dialysis: Secondary | ICD-10-CM | POA: Diagnosis not present

## 2021-12-22 DIAGNOSIS — N186 End stage renal disease: Secondary | ICD-10-CM | POA: Diagnosis not present

## 2021-12-22 DIAGNOSIS — N2581 Secondary hyperparathyroidism of renal origin: Secondary | ICD-10-CM | POA: Diagnosis not present

## 2021-12-22 DIAGNOSIS — E1129 Type 2 diabetes mellitus with other diabetic kidney complication: Secondary | ICD-10-CM | POA: Diagnosis not present

## 2021-12-22 DIAGNOSIS — E876 Hypokalemia: Secondary | ICD-10-CM | POA: Diagnosis not present

## 2021-12-23 DIAGNOSIS — E1122 Type 2 diabetes mellitus with diabetic chronic kidney disease: Secondary | ICD-10-CM | POA: Diagnosis not present

## 2021-12-23 DIAGNOSIS — Z992 Dependence on renal dialysis: Secondary | ICD-10-CM | POA: Diagnosis not present

## 2021-12-23 DIAGNOSIS — N186 End stage renal disease: Secondary | ICD-10-CM | POA: Diagnosis not present

## 2021-12-25 DIAGNOSIS — N186 End stage renal disease: Secondary | ICD-10-CM | POA: Diagnosis not present

## 2021-12-25 DIAGNOSIS — E876 Hypokalemia: Secondary | ICD-10-CM | POA: Diagnosis not present

## 2021-12-25 DIAGNOSIS — N2581 Secondary hyperparathyroidism of renal origin: Secondary | ICD-10-CM | POA: Diagnosis not present

## 2021-12-25 DIAGNOSIS — D631 Anemia in chronic kidney disease: Secondary | ICD-10-CM | POA: Diagnosis not present

## 2021-12-25 DIAGNOSIS — Z992 Dependence on renal dialysis: Secondary | ICD-10-CM | POA: Diagnosis not present

## 2021-12-25 DIAGNOSIS — E1129 Type 2 diabetes mellitus with other diabetic kidney complication: Secondary | ICD-10-CM | POA: Diagnosis not present

## 2021-12-26 ENCOUNTER — Other Ambulatory Visit: Payer: Self-pay

## 2021-12-26 DIAGNOSIS — Z20822 Contact with and (suspected) exposure to covid-19: Secondary | ICD-10-CM | POA: Diagnosis not present

## 2021-12-26 DIAGNOSIS — I739 Peripheral vascular disease, unspecified: Secondary | ICD-10-CM

## 2021-12-27 DIAGNOSIS — N2581 Secondary hyperparathyroidism of renal origin: Secondary | ICD-10-CM | POA: Diagnosis not present

## 2021-12-27 DIAGNOSIS — E1129 Type 2 diabetes mellitus with other diabetic kidney complication: Secondary | ICD-10-CM | POA: Diagnosis not present

## 2021-12-27 DIAGNOSIS — Z992 Dependence on renal dialysis: Secondary | ICD-10-CM | POA: Diagnosis not present

## 2021-12-27 DIAGNOSIS — N186 End stage renal disease: Secondary | ICD-10-CM | POA: Diagnosis not present

## 2021-12-27 DIAGNOSIS — E876 Hypokalemia: Secondary | ICD-10-CM | POA: Diagnosis not present

## 2021-12-28 DIAGNOSIS — Z20822 Contact with and (suspected) exposure to covid-19: Secondary | ICD-10-CM | POA: Diagnosis not present

## 2021-12-29 DIAGNOSIS — N2581 Secondary hyperparathyroidism of renal origin: Secondary | ICD-10-CM | POA: Diagnosis not present

## 2021-12-29 DIAGNOSIS — Z992 Dependence on renal dialysis: Secondary | ICD-10-CM | POA: Diagnosis not present

## 2021-12-29 DIAGNOSIS — N186 End stage renal disease: Secondary | ICD-10-CM | POA: Diagnosis not present

## 2021-12-29 DIAGNOSIS — E1129 Type 2 diabetes mellitus with other diabetic kidney complication: Secondary | ICD-10-CM | POA: Diagnosis not present

## 2021-12-29 DIAGNOSIS — E876 Hypokalemia: Secondary | ICD-10-CM | POA: Diagnosis not present

## 2022-01-01 DIAGNOSIS — E1129 Type 2 diabetes mellitus with other diabetic kidney complication: Secondary | ICD-10-CM | POA: Diagnosis not present

## 2022-01-01 DIAGNOSIS — Z992 Dependence on renal dialysis: Secondary | ICD-10-CM | POA: Diagnosis not present

## 2022-01-01 DIAGNOSIS — E876 Hypokalemia: Secondary | ICD-10-CM | POA: Diagnosis not present

## 2022-01-01 DIAGNOSIS — N2581 Secondary hyperparathyroidism of renal origin: Secondary | ICD-10-CM | POA: Diagnosis not present

## 2022-01-01 DIAGNOSIS — N186 End stage renal disease: Secondary | ICD-10-CM | POA: Diagnosis not present

## 2022-01-03 DIAGNOSIS — Z992 Dependence on renal dialysis: Secondary | ICD-10-CM | POA: Diagnosis not present

## 2022-01-03 DIAGNOSIS — E1129 Type 2 diabetes mellitus with other diabetic kidney complication: Secondary | ICD-10-CM | POA: Diagnosis not present

## 2022-01-03 DIAGNOSIS — N186 End stage renal disease: Secondary | ICD-10-CM | POA: Diagnosis not present

## 2022-01-03 DIAGNOSIS — N2581 Secondary hyperparathyroidism of renal origin: Secondary | ICD-10-CM | POA: Diagnosis not present

## 2022-01-03 DIAGNOSIS — E876 Hypokalemia: Secondary | ICD-10-CM | POA: Diagnosis not present

## 2022-01-05 DIAGNOSIS — Z992 Dependence on renal dialysis: Secondary | ICD-10-CM | POA: Diagnosis not present

## 2022-01-05 DIAGNOSIS — N2581 Secondary hyperparathyroidism of renal origin: Secondary | ICD-10-CM | POA: Diagnosis not present

## 2022-01-05 DIAGNOSIS — E876 Hypokalemia: Secondary | ICD-10-CM | POA: Diagnosis not present

## 2022-01-05 DIAGNOSIS — E1129 Type 2 diabetes mellitus with other diabetic kidney complication: Secondary | ICD-10-CM | POA: Diagnosis not present

## 2022-01-05 DIAGNOSIS — N186 End stage renal disease: Secondary | ICD-10-CM | POA: Diagnosis not present

## 2022-01-08 DIAGNOSIS — N2581 Secondary hyperparathyroidism of renal origin: Secondary | ICD-10-CM | POA: Diagnosis not present

## 2022-01-08 DIAGNOSIS — Z992 Dependence on renal dialysis: Secondary | ICD-10-CM | POA: Diagnosis not present

## 2022-01-08 DIAGNOSIS — E1129 Type 2 diabetes mellitus with other diabetic kidney complication: Secondary | ICD-10-CM | POA: Diagnosis not present

## 2022-01-08 DIAGNOSIS — E876 Hypokalemia: Secondary | ICD-10-CM | POA: Diagnosis not present

## 2022-01-08 DIAGNOSIS — N186 End stage renal disease: Secondary | ICD-10-CM | POA: Diagnosis not present

## 2022-01-08 NOTE — Progress Notes (Signed)
?HISTORY AND PHYSICAL  ? ? ? ?CC:  follow up. ?Requesting Provider:  Elby Showers, MD ? ?HPI: This is a 81 y.o. male who is here today for follow up for PAD.  Pt has hx of aortobifemoral bypass in the remote past by Dr. Amedeo Plenty.  We have followed him intermittently for his peripheral arterial disease.  ? ?Pt was last seen for PAD on 12/01/2020 and at that time, he was doing well without claudication sx, however, at that time, he was in rehab after femur fx and operative repair and was using an wheelchair.  He did not have rest pain.   ? ?Pt also has hx of ESRD and was last seen in our office in August 2022 after undergoing revision of right BC AVF and central venoplasty on 01/26/2021 for significant ulceration.  In the interim, he had been treated for RLE cellulitis that was resolved.   ? ?The pt returns today for follow up accompanied by his wife of 48 years.  His biggest complaint today is back pain.  His wife tells me he has arthritis and other issues.  [name] he was seen a year ago, he was recovering from a femur fx.  He is now walking with a rolling walker.  He does have ESRD and on HD M/W/F.  He states he gets some cramping on HD.  He also has some complaints of RLE swelling.  His wife states this is not new.  She is unsure if it improves after he has been in bed.  He does have skin color changes around the right lower leg.  He does endorse some cramping in the right calf [name] he walks.  He denies any rest pain or non healing wounds.  ? ?The pt is on a statin for cholesterol management.    ?The pt is not on an aspirin.    Other AC:  none ?The pt is not on medication for hypertension.  ?The pt does not have diabetes. ?Tobacco hx:  former ? ? ?Past Medical History:  ?Diagnosis Date  ? Allergy   ? Alzheimer's disease ([place]) 01/19/2021  ? Anemia   ? Arthritis   ? Cataract   ? bil cateracts removed  ? Coronary artery disease   ? Dementia arising in the senium and presenium ([place])   ? Diabetes mellitus   ? Type 2  ?  Diverticulitis   ? ED (erectile dysfunction)   ? Elevated homocysteine   ? ESRD (end stage renal disease) on dialysis ([place]) 03/2015  ? M-W-F dialysis  ? GERD (gastroesophageal reflux disease)   ? pepto   ? Gout   ? Hiatal hernia   ? Hyperlipidemia   ? Hypertension   ? Hypothyroidism   ? PVD (peripheral vascular disease) ([place])   ? has plastic aorta  ? Renal insufficiency   ? Sleep apnea   ? does not wear c-pap  ? ? ?Past Surgical History:  ?Procedure Laterality Date  ? ANGIOPLASTY Right 01/26/2021  ? Procedure: ANGIOPLASTY RIGHT SUBCLAVIAN VEIN;  Surgeon: Serafina Mitchell, MD;  Location: [facility] OR;  Service: Vascular;  Laterality: Right;  ? aortobifemoral bypass    ? AV FISTULA PLACEMENT Left 12/01/2013  ? Procedure: ARTERIOVENOUS (AV) FISTULA CREATION- LEFT BRACHIOCEPHALIC;  Surgeon: Angelia Mould, MD;  Location: [place];  Service: Vascular;  Laterality: Left;  ? BASCILIC VEIN TRANSPOSITION Right 07/27/2014  ? Procedure: BASCILIC VEIN TRANSPOSITION;  Surgeon: Angelia Mould, MD;  Location: [place];  Service: Vascular;  Laterality: Right;  ? BRAIN SURGERY  07/22/2018  ? BREAST SURGERY    ? left - granulomatous mastitis  ? BURR HOLE Bilateral 07/22/2018  ? Procedure: BILATERAL BURR HOLES;  Surgeon: Kristeen Miss, MD;  Location: [place];  Service: Neurosurgery;  Laterality: Bilateral;  ? COLONOSCOPY    ? 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 ([place] [facility])  1992  ? ENTEROSCOPY N/A 06/03/2018  ? Procedure: ENTEROSCOPY;  Surgeon: Lavena Bullion, DO;  Location: [place];  Service: Gastroenterology;  Laterality: N/A;  ? ENTEROSCOPY N/A 11/20/2018  ? Procedure: ENTEROSCOPY;  Surgeon: Rush Landmark Telford Nab., MD;  Location: [place];  Service: Gastroenterology;  Laterality: N/A;  ? ENTEROSCOPY N/A 12/03/2019  ? Procedure: ENTEROSCOPY;  Surgeon: Lavena Bullion, DO;  Location: [facility] ENDOSCOPY;  Service: Gastroenterology;  Laterality: N/A;  push enteroscopy  ? ENTEROSCOPY N/A 07/04/2021  ? Procedure: ENTEROSCOPY;  Surgeon:  Sharyn Creamer, MD;  Location: [place];  Service: Gastroenterology;  Laterality: N/A;  ? ENTEROSCOPY N/A 11/05/2021  ? Procedure: ENTEROSCOPY;  Surgeon: Daryel November, MD;  Location: [place];  Service: Gastroenterology;  Laterality: N/A;  ? EYE SURGERY Bilateral   ? cataracts  ? FISTULOGRAM Right 01/26/2021  ? Procedure: FISTULOGRAM RIGHT;  Surgeon: Serafina Mitchell, MD;  Location: [place];  Service: Vascular;  Laterality: Right;  ? HEMODIALYSIS INPATIENT  01/17/2018  ?    ? HOT HEMOSTASIS N/A 06/03/2018  ? Procedure: HOT HEMOSTASIS (ARGON PLASMA COAGULATION/BICAP);  Surgeon: Lavena Bullion, DO;  Location[facility] ENDOSCOPY;  Service: Gastroenterology;  Laterality: N/A;  ? HOT HEMOSTASIS N/A 11/20/2018  ? Procedure: HOT HEMOSTASIS (ARGON PLASMA COAGULATION/BICAP);  Surgeon: Irving Copas., MD;  Location: [place];  Service: Gastroenterology;  Laterality: N/A;  ? HOT HEMOSTASIS N/A 12/03/2019  ? Procedure: HOT HEMOSTASIS (ARGON PLASMA COAGULATION/BICAP);  Surgeon: Lavena Bullion, DO;  Location: [facility] ENDOSCOPY;  Service: Gastroenterology;  Laterality: N/A;  ? INTRAMEDULLARY (IM) NAIL INTERTROCHANTERIC Right 11/08/2020  ? Procedure: INTRAMEDULLARY (IM) NAIL INTERTROCHANTRIC;  Surgeon: Erle Crocker, MD;  Location: [place];  Service: Orthopedics;  Laterality: Right;  ? REVISION OF ARTERIOVENOUS GORETEX GRAFT Right 09/01/2019  ? Procedure: REVISION OF ARTERIOVENOUS FISTULA RIGHT ARM;  Surgeon: Serafina Mitchell, MD;  Location: [facility] OR;  Service: Vascular;  Laterality: Right;  ? REVISON OF ARTERIOVENOUS FISTULA Left 02/09/2014  ? Procedure: REVISON OF LEFT ARTERIOVENOUS FISTULA - RESECTION OF RENDUNDANT VEIN;  Surgeon: Angelia Mould, MD;  Location: [place];  Service: Vascular;  Laterality: Left;  ? REVISON OF ARTERIOVENOUS FISTULA Right 01/26/2021  ? Procedure: REVISON OF ARTERIOVENOUS FISTULA RIGHT;  Surgeon: Serafina Mitchell, MD;  Location: [facility] OR;  Service: Vascular;  Laterality: Right;  ? SBO  with lysis adhesions    ? SHUNTOGRAM Left 04/19/2014  ? Procedure: FISTULOGRAM;  Surgeon: Angelia Mould, MD;  Location: [facility] CATH LAB;  Service: Cardiovascular;  Laterality: Left;  ? UNILATERAL UPPER EXTREMEITY ANGIOGRAM N/A 07/12/2014  ? Procedure: UNILATERAL UPPER Anselmo Rod;  Surgeon: Angelia Mould, MD;  Location: [facility] CATH LAB;  Service: Cardiovascular;  Laterality: N/A;  ? ? ?Allergies  ?Allergen Reactions  ? Ambien [Zolpidem Tartrate] Other (See Comments)  ?  Hallucinations and "felt crazy"   ? Penicillins Rash and Hives  ?  Has patient had a PCN reaction causing immediate rash, facial/tongue/throat swelling, SOB or lightheadedness with hypotension: Yes ?Has patient had a PCN reaction causing severe rash involving mucus membranes or skin necrosis: Yes ?Has patient had a PCN  reaction that required hospitalization: No ?Has patient had a PCN reaction occurring within the last 10 years: No ?If all of the above answers are "NO", then may proceed with Cephalosporin use. ? ?Other reaction(s): HIVES  ? ? ?Current Outpatient Medications  ?Medication Sig Dispense Refill  ? acetaminophen (TYLENOL) 325 MG tablet Take 1-2 tablets (325-650 mg total) by mouth every 4 (four) hours as needed for mild pain.    ? albuterol (VENTOLIN HFA) 108 (90 Base) MCG/ACT inhaler Inhale 1 puff into the lungs daily as needed for wheezing or shortness of breath.    ? allopurinol (ZYLOPRIM) 100 MG tablet Take 100 mg by mouth daily.    ? Blood Glucose Monitoring Suppl (FREESTYLE FREEDOM LITE) w/Device KIT Use to check blood sugar 2 times per day dx code E11.65 1 each 0  ? camphor-menthol (SARNA) lotion Apply 1 application topically 4 (four) times daily as needed for itching.     ? Carboxymethylcellulose Sodium (THERATEARS) 0.25 % SOLN Place 1 drop into both eyes daily as needed (for dryness).    ? cetirizine (ZYRTEC) 10 MG tablet Take 10 mg by mouth daily as needed for allergies.    ? dicyclomine (BENTYL) 10 MG capsule Take  1 capsule (10 mg total) by mouth every 8 (eight) hours as needed for spasms. 60 capsule 3  ? donepezil (ARICEPT) 10 MG tablet Take 1 tablet (10 mg total) by mouth at bedtime. 90 tablet 0  ? fluticasone (FLONASE

## 2022-01-09 ENCOUNTER — Ambulatory Visit (HOSPITAL_COMMUNITY)
Admission: RE | Admit: 2022-01-09 | Discharge: 2022-01-09 | Disposition: A | Discharge status: Home / Self Care | Payer: Medicare Other | Source: Ambulatory Visit | Attending: Vascular Surgery | Admitting: Vascular Surgery

## 2022-01-09 ENCOUNTER — Ambulatory Visit (INDEPENDENT_AMBULATORY_CARE_PROVIDER_SITE_OTHER): Payer: Medicare Other | Admitting: Physician Assistant

## 2022-01-09 ENCOUNTER — Other Ambulatory Visit: Payer: Medicare Other | Admitting: Nurse Practitioner

## 2022-01-09 VITALS — BP 151/58 | HR 68 | Temp 98.2°F | Resp 20 | Ht 71.0 in

## 2022-01-09 DIAGNOSIS — I739 Peripheral vascular disease, unspecified: Secondary | ICD-10-CM

## 2022-01-10 DIAGNOSIS — E876 Hypokalemia: Secondary | ICD-10-CM | POA: Diagnosis not present

## 2022-01-10 DIAGNOSIS — N2581 Secondary hyperparathyroidism of renal origin: Secondary | ICD-10-CM | POA: Diagnosis not present

## 2022-01-10 DIAGNOSIS — N186 End stage renal disease: Secondary | ICD-10-CM | POA: Diagnosis not present

## 2022-01-10 DIAGNOSIS — E1129 Type 2 diabetes mellitus with other diabetic kidney complication: Secondary | ICD-10-CM | POA: Diagnosis not present

## 2022-01-10 DIAGNOSIS — Z992 Dependence on renal dialysis: Secondary | ICD-10-CM | POA: Diagnosis not present

## 2022-01-12 ENCOUNTER — Other Ambulatory Visit: Payer: Self-pay | Admitting: *Deleted

## 2022-01-12 DIAGNOSIS — I739 Peripheral vascular disease, unspecified: Secondary | ICD-10-CM

## 2022-01-12 DIAGNOSIS — E1129 Type 2 diabetes mellitus with other diabetic kidney complication: Secondary | ICD-10-CM | POA: Diagnosis not present

## 2022-01-12 DIAGNOSIS — E876 Hypokalemia: Secondary | ICD-10-CM | POA: Diagnosis not present

## 2022-01-12 DIAGNOSIS — Z992 Dependence on renal dialysis: Secondary | ICD-10-CM | POA: Diagnosis not present

## 2022-01-12 DIAGNOSIS — N186 End stage renal disease: Secondary | ICD-10-CM | POA: Diagnosis not present

## 2022-01-12 DIAGNOSIS — M7989 Other specified soft tissue disorders: Secondary | ICD-10-CM

## 2022-01-12 DIAGNOSIS — N2581 Secondary hyperparathyroidism of renal origin: Secondary | ICD-10-CM | POA: Diagnosis not present

## 2022-01-15 DIAGNOSIS — E1129 Type 2 diabetes mellitus with other diabetic kidney complication: Secondary | ICD-10-CM | POA: Diagnosis not present

## 2022-01-15 DIAGNOSIS — N186 End stage renal disease: Secondary | ICD-10-CM | POA: Diagnosis not present

## 2022-01-15 DIAGNOSIS — N2581 Secondary hyperparathyroidism of renal origin: Secondary | ICD-10-CM | POA: Diagnosis not present

## 2022-01-15 DIAGNOSIS — E876 Hypokalemia: Secondary | ICD-10-CM | POA: Diagnosis not present

## 2022-01-15 DIAGNOSIS — Z992 Dependence on renal dialysis: Secondary | ICD-10-CM | POA: Diagnosis not present

## 2022-01-16 DIAGNOSIS — Z20822 Contact with and (suspected) exposure to covid-19: Secondary | ICD-10-CM | POA: Diagnosis not present

## 2022-01-17 DIAGNOSIS — N186 End stage renal disease: Secondary | ICD-10-CM | POA: Diagnosis not present

## 2022-01-17 DIAGNOSIS — Z992 Dependence on renal dialysis: Secondary | ICD-10-CM | POA: Diagnosis not present

## 2022-01-17 DIAGNOSIS — E1129 Type 2 diabetes mellitus with other diabetic kidney complication: Secondary | ICD-10-CM | POA: Diagnosis not present

## 2022-01-17 DIAGNOSIS — N2581 Secondary hyperparathyroidism of renal origin: Secondary | ICD-10-CM | POA: Diagnosis not present

## 2022-01-17 DIAGNOSIS — E876 Hypokalemia: Secondary | ICD-10-CM | POA: Diagnosis not present

## 2022-01-19 DIAGNOSIS — N2581 Secondary hyperparathyroidism of renal origin: Secondary | ICD-10-CM | POA: Diagnosis not present

## 2022-01-19 DIAGNOSIS — N186 End stage renal disease: Secondary | ICD-10-CM | POA: Diagnosis not present

## 2022-01-19 DIAGNOSIS — E876 Hypokalemia: Secondary | ICD-10-CM | POA: Diagnosis not present

## 2022-01-19 DIAGNOSIS — E1129 Type 2 diabetes mellitus with other diabetic kidney complication: Secondary | ICD-10-CM | POA: Diagnosis not present

## 2022-01-19 DIAGNOSIS — Z992 Dependence on renal dialysis: Secondary | ICD-10-CM | POA: Diagnosis not present

## 2022-01-22 DIAGNOSIS — D631 Anemia in chronic kidney disease: Secondary | ICD-10-CM | POA: Diagnosis not present

## 2022-01-22 DIAGNOSIS — Z992 Dependence on renal dialysis: Secondary | ICD-10-CM | POA: Diagnosis not present

## 2022-01-22 DIAGNOSIS — E876 Hypokalemia: Secondary | ICD-10-CM | POA: Diagnosis not present

## 2022-01-22 DIAGNOSIS — E1122 Type 2 diabetes mellitus with diabetic chronic kidney disease: Secondary | ICD-10-CM | POA: Diagnosis not present

## 2022-01-22 DIAGNOSIS — N186 End stage renal disease: Secondary | ICD-10-CM | POA: Diagnosis not present

## 2022-01-22 DIAGNOSIS — E1129 Type 2 diabetes mellitus with other diabetic kidney complication: Secondary | ICD-10-CM | POA: Diagnosis not present

## 2022-01-22 DIAGNOSIS — N2581 Secondary hyperparathyroidism of renal origin: Secondary | ICD-10-CM | POA: Diagnosis not present

## 2022-01-23 ENCOUNTER — Other Ambulatory Visit: Payer: Self-pay

## 2022-01-23 DIAGNOSIS — M7989 Other specified soft tissue disorders: Secondary | ICD-10-CM

## 2022-01-23 NOTE — Progress Notes (Signed)
Error

## 2022-01-24 DIAGNOSIS — Z992 Dependence on renal dialysis: Secondary | ICD-10-CM | POA: Diagnosis not present

## 2022-01-24 DIAGNOSIS — N186 End stage renal disease: Secondary | ICD-10-CM | POA: Diagnosis not present

## 2022-01-24 DIAGNOSIS — N2581 Secondary hyperparathyroidism of renal origin: Secondary | ICD-10-CM | POA: Diagnosis not present

## 2022-01-24 DIAGNOSIS — D631 Anemia in chronic kidney disease: Secondary | ICD-10-CM | POA: Diagnosis not present

## 2022-01-24 DIAGNOSIS — E876 Hypokalemia: Secondary | ICD-10-CM | POA: Diagnosis not present

## 2022-01-26 DIAGNOSIS — E876 Hypokalemia: Secondary | ICD-10-CM | POA: Diagnosis not present

## 2022-01-26 DIAGNOSIS — N186 End stage renal disease: Secondary | ICD-10-CM | POA: Diagnosis not present

## 2022-01-26 DIAGNOSIS — Z992 Dependence on renal dialysis: Secondary | ICD-10-CM | POA: Diagnosis not present

## 2022-01-26 DIAGNOSIS — D631 Anemia in chronic kidney disease: Secondary | ICD-10-CM | POA: Diagnosis not present

## 2022-01-26 DIAGNOSIS — N2581 Secondary hyperparathyroidism of renal origin: Secondary | ICD-10-CM | POA: Diagnosis not present

## 2022-01-29 DIAGNOSIS — E876 Hypokalemia: Secondary | ICD-10-CM | POA: Diagnosis not present

## 2022-01-29 DIAGNOSIS — Z992 Dependence on renal dialysis: Secondary | ICD-10-CM | POA: Diagnosis not present

## 2022-01-29 DIAGNOSIS — D631 Anemia in chronic kidney disease: Secondary | ICD-10-CM | POA: Diagnosis not present

## 2022-01-29 DIAGNOSIS — N2581 Secondary hyperparathyroidism of renal origin: Secondary | ICD-10-CM | POA: Diagnosis not present

## 2022-01-29 DIAGNOSIS — N186 End stage renal disease: Secondary | ICD-10-CM | POA: Diagnosis not present

## 2022-01-31 DIAGNOSIS — N186 End stage renal disease: Secondary | ICD-10-CM | POA: Diagnosis not present

## 2022-01-31 DIAGNOSIS — N2581 Secondary hyperparathyroidism of renal origin: Secondary | ICD-10-CM | POA: Diagnosis not present

## 2022-01-31 DIAGNOSIS — E876 Hypokalemia: Secondary | ICD-10-CM | POA: Diagnosis not present

## 2022-01-31 DIAGNOSIS — D631 Anemia in chronic kidney disease: Secondary | ICD-10-CM | POA: Diagnosis not present

## 2022-01-31 DIAGNOSIS — Z992 Dependence on renal dialysis: Secondary | ICD-10-CM | POA: Diagnosis not present

## 2022-02-02 DIAGNOSIS — Z992 Dependence on renal dialysis: Secondary | ICD-10-CM | POA: Diagnosis not present

## 2022-02-02 DIAGNOSIS — E876 Hypokalemia: Secondary | ICD-10-CM | POA: Diagnosis not present

## 2022-02-02 DIAGNOSIS — N186 End stage renal disease: Secondary | ICD-10-CM | POA: Diagnosis not present

## 2022-02-02 DIAGNOSIS — N2581 Secondary hyperparathyroidism of renal origin: Secondary | ICD-10-CM | POA: Diagnosis not present

## 2022-02-02 DIAGNOSIS — D631 Anemia in chronic kidney disease: Secondary | ICD-10-CM | POA: Diagnosis not present

## 2022-02-05 DIAGNOSIS — N2581 Secondary hyperparathyroidism of renal origin: Secondary | ICD-10-CM | POA: Diagnosis not present

## 2022-02-05 DIAGNOSIS — D631 Anemia in chronic kidney disease: Secondary | ICD-10-CM | POA: Diagnosis not present

## 2022-02-05 DIAGNOSIS — Z992 Dependence on renal dialysis: Secondary | ICD-10-CM | POA: Diagnosis not present

## 2022-02-05 DIAGNOSIS — E876 Hypokalemia: Secondary | ICD-10-CM | POA: Diagnosis not present

## 2022-02-05 DIAGNOSIS — N186 End stage renal disease: Secondary | ICD-10-CM | POA: Diagnosis not present

## 2022-02-06 ENCOUNTER — Ambulatory Visit (HOSPITAL_COMMUNITY)
Admission: RE | Admit: 2022-02-06 | Discharge: 2022-02-06 | Disposition: A | Discharge status: Home / Self Care | Payer: Medicare Other | Source: Ambulatory Visit | Attending: Physician Assistant | Admitting: Physician Assistant

## 2022-02-06 ENCOUNTER — Encounter: Payer: Self-pay | Admitting: Physician Assistant

## 2022-02-06 ENCOUNTER — Ambulatory Visit (INDEPENDENT_AMBULATORY_CARE_PROVIDER_SITE_OTHER): Payer: Medicare Other | Admitting: Physician Assistant

## 2022-02-06 VITALS — BP 131/46 | HR 60 | Temp 98.0°F | Resp 20 | Ht 71.0 in

## 2022-02-06 DIAGNOSIS — M7989 Other specified soft tissue disorders: Secondary | ICD-10-CM | POA: Insufficient documentation

## 2022-02-06 DIAGNOSIS — I872 Venous insufficiency (chronic) (peripheral): Secondary | ICD-10-CM | POA: Diagnosis not present

## 2022-02-06 NOTE — Progress Notes (Signed)
?POST OPERATIVE OFFICE NOTE ? ? ? ?CC:  F/u for surgery ? ?HPI:  This is a 81 y.o. male who has hx of aortobifemoral bypass with stable ABI's at his last visit 01/09/22.  He was noted to have right LE swelling. His edema is chronic in nature.  He denies non healing wounds or skin weeping.  He is here for f/u reflux study. ? He denies history of DVT, recent injury, or recent surgery.   ? His past medical history includes ESRD, Right femur fracture IM nail fixation 11/08/20,  ?History of pancreatitis, Alzheimer's.   ? ? ?Allergies  ?Allergen Reactions  ? Ambien [Zolpidem Tartrate] Other (See Comments)  ?  Hallucinations and "felt crazy"   ? Penicillins Rash and Hives  ?  Has patient had a PCN reaction causing immediate rash, facial/tongue/throat swelling, SOB or lightheadedness with hypotension: Yes ?Has patient had a PCN reaction causing severe rash involving mucus membranes or skin necrosis: Yes ?Has patient had a PCN reaction that required hospitalization: No ?Has patient had a PCN reaction occurring within the last 10 years: No ?If all of the above answers are "NO", then may proceed with Cephalosporin use. ? ?Other reaction(s): HIVES  ? ? ?Current Outpatient Medications  ?Medication Sig Dispense Refill  ? acetaminophen (TYLENOL) 325 MG tablet Take 1-2 tablets (325-650 mg total) by mouth every 4 (four) hours as needed for mild pain.    ? albuterol (VENTOLIN HFA) 108 (90 Base) MCG/ACT inhaler Inhale 1 puff into the lungs daily as needed for wheezing or shortness of breath.    ? allopurinol (ZYLOPRIM) 100 MG tablet Take 100 mg by mouth daily.    ? Blood Glucose Monitoring Suppl (FREESTYLE FREEDOM LITE) w/Device KIT Use to check blood sugar 2 times per day dx code E11.65 1 each 0  ? camphor-menthol (SARNA) lotion Apply 1 application topically 4 (four) times daily as needed for itching.     ? Carboxymethylcellulose Sodium (THERATEARS) 0.25 % SOLN Place 1 drop into both eyes daily as needed (for dryness).    ? cetirizine  (ZYRTEC) 10 MG tablet Take 10 mg by mouth daily as needed for allergies.    ? dicyclomine (BENTYL) 10 MG capsule Take 1 capsule (10 mg total) by mouth every 8 (eight) hours as needed for spasms. 60 capsule 3  ? donepezil (ARICEPT) 10 MG tablet Take 1 tablet (10 mg total) by mouth at bedtime. 90 tablet 0  ? fluticasone (FLONASE) 50 MCG/ACT nasal spray Place 1 spray into both nostrils daily. (Patient taking differently: Place 1 spray into both nostrils daily as needed for allergies.) 16 g 2  ? glucose blood (FREESTYLE LITE) test strip USE AS DIRECTED THREE TIMES DAILY (Patient taking differently: Check blood sugar bid) 300 strip 2  ? hydrocortisone 2.5 % cream Apply 1 application topically 3 (three) times daily as needed (skin irritation (legs & arms)).     ? Lancets (FREESTYLE) lancets Use as instructed to check blood sugar 2 times per day dx code E11.65 100 each 3  ? levothyroxine (SYNTHROID, LEVOTHROID) 50 MCG tablet Take 1 tablet (50 mcg total) by mouth daily. (Patient taking differently: Take 50 mcg by mouth daily before breakfast.) 90 tablet 1  ? lidocaine (LIDODERM) 5 % Place 1 patch onto the skin daily as needed (back pain).    ? memantine (NAMENDA) 10 MG tablet Take 1 tablet (10 mg total) by mouth 2 (two) times daily. 180 tablet 0  ? midodrine (PROAMATINE) 10 MG tablet Take  one pill prior to hemodialysis on MWF (Patient taking differently: Take 5 mg by mouth See admin instructions. Take one tablet by mouth prior to hemodialysis on MWF)    ? Nutritional Supplements (FEEDING SUPPLEMENT, NEPRO CARB STEADY,) LIQD Take 237 mLs by mouth daily.    ? omeprazole (PRILOSEC) 40 MG capsule Take 1 capsule (40 mg total) by mouth daily. 30 capsule 0  ? sevelamer carbonate (RENVELA) 800 MG tablet Take 1,600-2,400 mg by mouth See admin instructions. Take 2,400 mg by mouth three times a day with meals and 1,600 mg with snacks per spouse    ? simvastatin (ZOCOR) 20 MG tablet Take 20 mg by mouth at bedtime.    ? Teriparatide,  Recombinant, (FORTEO) 600 MCG/2.4ML SOPN Inject 20 mcg subcutaneously daily (Patient taking differently: Inject 20 mcg into the skin at bedtime.) 7.2 mL 1  ? TRADJENTA 5 MG TABS tablet Take 1 tablet (5 mg total) by mouth daily. 90 tablet 3  ? ?No current facility-administered medications for this visit.  ? ? ? ROS:  See HPI ? ?Physical Exam: ? ?Venous Reflux Times  ?+--------------+---------+------+-----------+------------+--------+  ?RIGHT         Reflux NoRefluxReflux TimeDiameter cmsComments  ?                        Yes                                   ?+--------------+---------+------+-----------+------------+--------+  ?CFV                     yes   >1 second                       ?+--------------+---------+------+-----------+------------+--------+  ?FV prox       no                                              ?+--------------+---------+------+-----------+------------+--------+  ?FV mid        no                                              ?+--------------+---------+------+-----------+------------+--------+  ?FV dist       no                                              ?+--------------+---------+------+-----------+------------+--------+  ?Popliteal     no                                              ?+--------------+---------+------+-----------+------------+--------+  ?GSV at [facility]              yes    >500 ms     0.335              ?+--------------+---------+------+-----------+------------+--------+  ?GSV prox thigh          yes    >500 ms  0.26              ?+--------------+---------+------+-----------+------------+--------+  ?GSV mid thigh           yes    >500 ms     0.274              ?+--------------+---------+------+-----------+------------+--------+  ?GSV dist thigh          yes    >500 ms     0.208              ?+--------------+---------+------+-----------+------------+--------+  ?GSV at knee             yes    >500  ms     0.173              ?+--------------+---------+------+-----------+------------+--------+  ?GSV prox calf           yes    >500 ms     0.173              ?+--------------+---------+------+-----------+------------+--------+  ?SSV prox calf no                           0.146              ?+--------------+---------+------+-----------+------------+--------+  ?SSV mid calf  no                           0.123              ?+--------------+---------+------+-----------+------------+--------+  ? ?   ?Summary:  ?Right:  ?- No evidence of deep vein thrombosis seen in the right lower extremity,  ?from the common femoral through the popliteal veins.  ?- No evidence of superficial venous thrombosis in the right lower  ?extremity.  ?   ?- Deep vein reflux in the CFV.  ?- Superficial vein reflux in the SFJ  ? ?Right LE no apparent edema, small pretibial area of darkling/reddish brown skin discoloration. ? ? ?Assessment/Plan:  This is a 81 y.o. male who is s/p: aortobifemoral bypass  by Dr. Amedeo Plenty in the distant pas.  He has stable ABI's and is basically asymptomatic on exam 01/09/22.  It was noted that he has had chronic right LE edema with mild skin changes. ? The reflux study is negative for DVT.  There is reflux at the [facility] to the proximal calf through out the GSV.  The vein size is < 0.4.  He does not qualify for intervention and he will cont. To wear Knee high mild compression daily, elevation , exercise and avoid prolonged sitting and standing.   ?  ? ? ? ? ?Roxy Horseman ? PAC ?[facility] ?[phone] ? ? ?Clinic MD:  Carlis Abbott ?

## 2022-02-07 DIAGNOSIS — N2581 Secondary hyperparathyroidism of renal origin: Secondary | ICD-10-CM | POA: Diagnosis not present

## 2022-02-07 DIAGNOSIS — E876 Hypokalemia: Secondary | ICD-10-CM | POA: Diagnosis not present

## 2022-02-07 DIAGNOSIS — Z992 Dependence on renal dialysis: Secondary | ICD-10-CM | POA: Diagnosis not present

## 2022-02-07 DIAGNOSIS — N186 End stage renal disease: Secondary | ICD-10-CM | POA: Diagnosis not present

## 2022-02-07 DIAGNOSIS — D631 Anemia in chronic kidney disease: Secondary | ICD-10-CM | POA: Diagnosis not present

## 2022-02-08 ENCOUNTER — Other Ambulatory Visit: Payer: Medicare Other | Admitting: Nurse Practitioner

## 2022-02-08 ENCOUNTER — Encounter: Payer: Self-pay | Admitting: Nurse Practitioner

## 2022-02-08 DIAGNOSIS — Z515 Encounter for palliative care: Secondary | ICD-10-CM

## 2022-02-08 DIAGNOSIS — B20 Human immunodeficiency virus [HIV] disease: Secondary | ICD-10-CM | POA: Diagnosis not present

## 2022-02-08 DIAGNOSIS — F02818 Dementia in other diseases classified elsewhere, unspecified severity, with other behavioral disturbance: Secondary | ICD-10-CM

## 2022-02-08 DIAGNOSIS — R630 Anorexia: Secondary | ICD-10-CM | POA: Diagnosis not present

## 2022-02-08 NOTE — Progress Notes (Signed)
[facility] Palliative Care Consult Note Telephone: [phone]  Fax: [phone]    Date of encounter: 02/08/22 1:51 PM PATIENT NAME: [name] [address] [place] [address]   [id number] (home)  DOB: Oct 20, 1940 MRN: [id number] PRIMARY CARE PROVIDER:    Elby Showers, MD,  [address] [address] [phone]  RESPONSIBLE PARTY:    Contact Information     Name Relation Home Work Mobile   Newkirk,Shirley Spouse [phone]  [phone]   Stahlman,Crystal Daughter [phone]  [phone]   Summers,Tammy Niece [phone]  [phone]      I met face to face with patient and family in home. Palliative Care was asked to follow this patient by consultation request of  Baxley, Cresenciano Lick, MD to address advance care planning and complex medical decision making. This is a follow up visit.                                  ASSESSMENT AND PLAN / RECOMMENDATIONS:  Symptom Management/Plan: 1. ACP; DNR; MOST form in [place]; Continue to treat what is treatable.    2. Anorexia; secondary to dementia; We talked about nutrition. We talked about supplements. We talked about disease progression of dementia in the setting of ESRD. We talked about soda's with fluid restriction.   3. Palliative care encounter; Palliative care encounter; Palliative medicine team will continue to support patient, patient's family, and medical team. Visit consisted of counseling and education dealing with the complex and emotionally intense issues of symptom management and palliative care in the setting of serious and potentially life-threatening illness Follow up Palliative Care Visit: Palliative care will continue to follow for complex medical decision making, advance care planning, and clarification of goals. Return 8 weeks or prn.  I spent 61 minutes providing this consultation. More than 50% of the time in this consultation was spent in counseling  and care coordination. PPS: 50% Chief Complaint: Follow up palliative consult for complex medical decision making  HISTORY OF PRESENT ILLNESS:  [name] is a 81 y.o. year old male  with  multiple medical problems including ESRD requiring HD, Alzheimer's disease, anemia, CAD, PVD, DM, HTN, HLD, sleep apnea, hypothyroidism, gerd, gout, arthritis, h/o hiatal hernia, h/o diverticulitis, ED, seasonal allergies, cataract, h/o burr holes, AV fistula placement, breast sgy left - granulomatous mastitis, aortobifemoral bypass, angioplasty. I called Mrs Evilsizer to confirm [facility] f/u visit and covid screening negative. I visited Mr and Mrs Mahan. I called Mrs Macke to confirm [facility] f/u visit and covid screening negative. I visited and observed Mr Cuthbertson in his home. We talked about how Mr Hopfensperger has been feeling. Mr. Badley smiles and shares he is doing okay, legs feel better and not any stomach pain except a pain every once in a while. We talked about ros, symptoms. We talked about HD, he continues to go independently by transportation to HD. Mr. Olmeda continues to tolerate HD, no missed treatments. We talked about overall quality of life, he talked about learning to golf. We talked about visit to vein clinic with improvement of edema to BLE. We talked about appetite remains decreased. Mr. Hoffmann talked about being discouraged of no longer being able to drink pepsi, liquids limited as last HD weights have been up. We talked about daily routine. We talked about his upcoming trip to the beach, he is excited about. We talked about sleep patterns,  now sleeping in his bed rather recliner. We talked about f/u pc in [place]. We talked about f/u visit, scheduled. Therapeutic listening, emotional support provided. Questions answered. I talked with Mrs Adduci separately from Mr Hudgins for lengthy discussion of chronic disease progression of dementia. We talked about overall cognitive decline. Mr. Matteucci seems to be sleeping longer. We talked about medical  goals. We talked about starting conversations with their daughter about goc, progression of dementia, cognitive decline if it gets to the point where Mr. Warren is not able to go to HD, or cognitively not able to stay on the HD machine. Currently wishes are to continue HD but will further discuss with daughter long range plans for when Mr Lopes further declines cognitively or functionally. Mrs Erman was thankful for discussion.    History obtained from review of EMR, discussion with Mrs and Mr. [name].  I reviewed available labs, medications, imaging, studies and related documents from the EMR.  Records reviewed and summarized above.    ROS 10 point system reviewed with Mr and Mrs Correa all negative except HPI   Physical Exam: Constitutional: NAD General: frail appearing, thin, pleasant male EYES: lids intact ENMT: oral mucous membranes moist CV: S1S2, RRR Pulmonary: LCTA, no increased work of breathing, no cough, room air Abdomen: normo-active BS + 4 quadrants, soft and non tender MSK: ambulatory Skin: warm and dry Neuro:  + generalized weakness,  + cognitive impairment Psych: non-anxious affect, A and O x 2  Thank you for the opportunity to participate in the care of Mr. Westry.  The palliative care team will continue to follow. Please call our office at [phone] if we can be of additional assistance.   Ediberto Sens Z Michio Thier, NP   COVID-19 PATIENT SCREENING TOOL Asked and negative response unless otherwise noted:   Have you had symptoms of covid, tested positive or been in contact with someone with symptoms/positive test in the past 5-10 days? no

## 2022-02-09 DIAGNOSIS — N186 End stage renal disease: Secondary | ICD-10-CM | POA: Diagnosis not present

## 2022-02-09 DIAGNOSIS — E876 Hypokalemia: Secondary | ICD-10-CM | POA: Diagnosis not present

## 2022-02-09 DIAGNOSIS — D631 Anemia in chronic kidney disease: Secondary | ICD-10-CM | POA: Diagnosis not present

## 2022-02-09 DIAGNOSIS — N2581 Secondary hyperparathyroidism of renal origin: Secondary | ICD-10-CM | POA: Diagnosis not present

## 2022-02-09 DIAGNOSIS — Z992 Dependence on renal dialysis: Secondary | ICD-10-CM | POA: Diagnosis not present

## 2022-02-12 DIAGNOSIS — D631 Anemia in chronic kidney disease: Secondary | ICD-10-CM | POA: Diagnosis not present

## 2022-02-12 DIAGNOSIS — N2581 Secondary hyperparathyroidism of renal origin: Secondary | ICD-10-CM | POA: Diagnosis not present

## 2022-02-12 DIAGNOSIS — E876 Hypokalemia: Secondary | ICD-10-CM | POA: Diagnosis not present

## 2022-02-12 DIAGNOSIS — N186 End stage renal disease: Secondary | ICD-10-CM | POA: Diagnosis not present

## 2022-02-12 DIAGNOSIS — Z992 Dependence on renal dialysis: Secondary | ICD-10-CM | POA: Diagnosis not present

## 2022-02-13 ENCOUNTER — Other Ambulatory Visit (INDEPENDENT_AMBULATORY_CARE_PROVIDER_SITE_OTHER): Payer: Medicare Other

## 2022-02-13 DIAGNOSIS — E1165 Type 2 diabetes mellitus with hyperglycemia: Secondary | ICD-10-CM | POA: Diagnosis not present

## 2022-02-13 LAB — GLUCOSE, RANDOM: Glucose, Bld: 136 mg/dL — ABNORMAL HIGH (ref 70–99)

## 2022-02-14 DIAGNOSIS — Z992 Dependence on renal dialysis: Secondary | ICD-10-CM | POA: Diagnosis not present

## 2022-02-14 DIAGNOSIS — D631 Anemia in chronic kidney disease: Secondary | ICD-10-CM | POA: Diagnosis not present

## 2022-02-14 DIAGNOSIS — N186 End stage renal disease: Secondary | ICD-10-CM | POA: Diagnosis not present

## 2022-02-14 DIAGNOSIS — E876 Hypokalemia: Secondary | ICD-10-CM | POA: Diagnosis not present

## 2022-02-14 DIAGNOSIS — N2581 Secondary hyperparathyroidism of renal origin: Secondary | ICD-10-CM | POA: Diagnosis not present

## 2022-02-14 LAB — FRUCTOSAMINE: Fructosamine: 359 umol/L — ABNORMAL HIGH (ref 0–285)

## 2022-02-15 ENCOUNTER — Encounter: Payer: Self-pay | Admitting: Endocrinology

## 2022-02-15 ENCOUNTER — Ambulatory Visit (INDEPENDENT_AMBULATORY_CARE_PROVIDER_SITE_OTHER): Payer: Medicare Other | Admitting: Endocrinology

## 2022-02-15 VITALS — BP 124/46 | HR 65 | Ht 68.0 in | Wt 150.2 lb

## 2022-02-15 DIAGNOSIS — E1165 Type 2 diabetes mellitus with hyperglycemia: Secondary | ICD-10-CM

## 2022-02-15 DIAGNOSIS — M810 Age-related osteoporosis without current pathological fracture: Secondary | ICD-10-CM

## 2022-02-15 LAB — POCT GLYCOSYLATED HEMOGLOBIN (HGB A1C): Hemoglobin A1C: 6.2 % — AB (ref 4.0–5.6)

## 2022-02-15 MED ORDER — REPAGLINIDE 0.5 MG PO TABS: 0.5000 mg | ORAL_TABLET | Freq: Two times a day (BID) | ORAL | 2 refills | Status: DC

## 2022-02-15 NOTE — Progress Notes (Signed)
Patient ID: [name], male   DOB: August 15, 1941, 81 y.o.   MRN: [id number]           Reason for Appointment:  Follow-up for endocrinology problems   History of Present Illness:          Date of diagnosis of type 2 diabetes mellitus:  1996      Background history:  He has had long-standing diabetes probably treated with metformin initially and subsequently with sulfonylurea drugs At some point he was also given Actos in addition to his glipizide which he is still taking Records of his control are only available for the last 4 years not on Over the last few years his A1c has been generally in the 6-7% range His A1c was 6.2 in May 2016 and apparently was starting to get low normal blood sugars at times He was admitted to the hospital for fluid overload and renal failure and at that time his Actos was stopped Subsequently his blood sugars had been markedly increased He was switched from regular insulin to NovoLog on his initial consultation  Recent history:   INSULIN regimen: None, previously on NovoLog 6 units with breakfast and usually 6 units before dinner  Oral hypoglycemic drugs:  Tradjenta 5 mg daily  His A1c tends to be falsely low, 6.2  Fructosamine in unchanged at 359 compared to 306 earlier this year   Current blood sugar patterns and problems identified: His blood sugars are being monitored mostly in the afternoons not at any other time His wife will check his blood sugar mostly after his snack or meal when he comes back from dialysis and if it is high she will check it again before dinner Unclear whether his blood sugars are high after dinner Blood sugar after breakfast was 136 in the lab  She has a gained 9 pounds in weight and likely eating more complete meals  He is eating a sandwich or a large snack with carbohydrates when he comes back from dialysis which frequently brings his blood sugar up to over 200 Has not used any insulin lately  Dialysis is done at 12  pm, doing it on Monday/Wednesday/Fridays     Side effects from medications have been: None  Compliance with the medical regimen: Fair  Glucose monitoring:  done mostly 1-2 times a day         Glucometer:  Freestyle  Blood Glucose readings by review of monitor    PRE-MEAL Fasting Lunch Dinner Bedtime Overall  Glucose range:   151-213    Mean/median:     160   POST-MEAL PC Breakfast PC Lunch PC Dinner  Glucose range:   130-219  Mean/median:       PRE-MEAL 10-11 AM Lunch Dinner Bedtime Overall  Glucose range: 88-145  91-226    Mean/median:   154  143   POST-MEAL PC Breakfast PC Lunch PC Dinner  Glucose range:     Mean/median:        Self-care: The diet that the patient has been following is: tries to limit sweets, portions. eating out only about once a week at a steak house        Typical meal intake: Breakfast is variable, ham biscuit; supper 7-8 pm            Dietician visit, most recent: 08/2017  Weight history:   Wt Readings from Last 3 Encounters:  02/15/22 150 lb 3.2 oz (68.1 kg)  12/12/21 141 lb 8 oz (64.2 kg)  11/16/21 144 lb 9.6 oz (65.6 kg)     Glycemic control:    Lab Results  Component Value Date   HGBA1C 6.2 (A) 02/15/2022   HGBA1C 6.1 (H) 10/04/2021   HGBA1C 5.8 (H) 10/02/2021   Lab Results  Component Value Date   MICROALBUR 37.2 09/03/2016   LDLCALC 15 05/09/2021   CREATININE 10.01 (H) 11/06/2021    Lab Results  Component Value Date   FRUCTOSAMINE 359 (H) 02/13/2022   FRUCTOSAMINE 359 (H) 11/14/2021   FRUCTOSAMINE 329 (H) 09/05/2021    PROBLEM 2 Osteoporosis: See review of systems     Allergies as of 02/15/2022       Reactions   Ambien [zolpidem Tartrate] Other (See Comments)   Hallucinations and "felt crazy"    Penicillins Rash, Hives   Has patient had a PCN reaction causing immediate rash, facial/tongue/throat swelling, SOB or lightheadedness with hypotension: Yes Has patient had a PCN reaction causing severe rash involving  mucus membranes or skin necrosis: Yes Has patient had a PCN reaction that required hospitalization: No Has patient had a PCN reaction occurring within the last 10 years: No If all of the above answers are "NO", then may proceed with Cephalosporin use. Other reaction(s): HIVES        Medication List        Accurate as of Feb 15, 2022 11:59 PM. If you have any questions, ask your nurse or doctor.          acetaminophen 325 MG tablet Commonly known as: TYLENOL Take 1-2 tablets (325-650 mg total) by mouth every 4 (four) hours as needed for mild pain.   albuterol 108 (90 Base) MCG/ACT inhaler Commonly known as: VENTOLIN HFA Inhale 1 puff into the lungs daily as needed for wheezing or shortness of breath.   allopurinol 100 MG tablet Commonly known as: ZYLOPRIM Take 100 mg by mouth daily.   camphor-menthol lotion Commonly known as: SARNA Apply 1 application topically 4 (four) times daily as needed for itching.   cetirizine 10 MG tablet Commonly known as: ZYRTEC Take 10 mg by mouth daily as needed for allergies.   dicyclomine 10 MG capsule Commonly known as: BENTYL Take 1 capsule (10 mg total) by mouth every 8 (eight) hours as needed for spasms.   donepezil 10 MG tablet Commonly known as: ARICEPT Take 1 tablet (10 mg total) by mouth at bedtime.   feeding supplement (NEPRO CARB STEADY) Liqd Take 237 mLs by mouth daily.   fluticasone 50 MCG/ACT nasal spray Commonly known as: FLONASE Place 1 spray into both nostrils daily. What changed:  when to take this reasons to take this   Forteo 600 MCG/2.4ML Sopn Generic drug: Teriparatide (Recombinant) Inject 20 mcg subcutaneously daily What changed:  how much to take how to take this when to take this additional instructions   FreeStyle Freedom Lite w/Device Kit Use to check blood sugar 2 times per day dx code E11.65   freestyle lancets Use as instructed to check blood sugar 2 times per day dx code E11.65    FREESTYLE LITE test strip Generic drug: glucose blood USE AS DIRECTED THREE TIMES DAILY What changed: additional instructions   hydrocortisone 2.5 % cream Apply 1 application topically 3 (three) times daily as needed (skin irritation (legs & arms)).   levothyroxine 50 MCG tablet Commonly known as: SYNTHROID Take 1 tablet (50 mcg total) by mouth daily. What changed: when to take this   lidocaine 5 % Commonly known as: Sentinel 1  patch onto the skin daily as needed (back pain).   memantine 10 MG tablet Commonly known as: NAMENDA Take 1 tablet (10 mg total) by mouth 2 (two) times daily.   midodrine 10 MG tablet Commonly known as: PROAMATINE Take one pill prior to hemodialysis on MWF What changed:  how much to take how to take this when to take this additional instructions   omeprazole 40 MG capsule Commonly known as: PRILOSEC Take 1 capsule (40 mg total) by mouth daily.   repaglinide 0.5 MG tablet Commonly known as: PRANDIN Take 1 tablet (0.5 mg total) by mouth 2 (two) times daily before a meal. Started by: Elayne Snare, MD   sevelamer carbonate 800 MG tablet Commonly known as: RENVELA Take 1,600-2,400 mg by mouth See admin instructions. Take 2,400 mg by mouth three times a day with meals and 1,600 mg with snacks per spouse   simvastatin 20 MG tablet Commonly known as: ZOCOR Take 20 mg by mouth at bedtime.   Theratears 0.25 % Soln Generic drug: Carboxymethylcellulose Sodium Place 1 drop into both eyes daily as needed (for dryness).   Tradjenta 5 MG Tabs tablet Generic drug: linagliptin Take 1 tablet (5 mg total) by mouth daily.        Allergies:  Allergies  Allergen Reactions   Ambien [Zolpidem Tartrate] Other (See Comments)    Hallucinations and "felt crazy"    Penicillins Rash and Hives    Has patient had a PCN reaction causing immediate rash, facial/tongue/throat swelling, SOB or lightheadedness with hypotension: Yes Has patient had a PCN  reaction causing severe rash involving mucus membranes or skin necrosis: Yes Has patient had a PCN reaction that required hospitalization: No Has patient had a PCN reaction occurring within the last 10 years: No If all of the above answers are "NO", then may proceed with Cephalosporin use.  Other reaction(s): HIVES    Past Medical History:  Diagnosis Date   Allergy    Alzheimer's disease ([place]) 01/19/2021   Anemia    Arthritis    Cataract    bil cateracts removed   Coronary artery disease    Dementia arising in the senium and presenium ([place])    Diabetes mellitus    Type 2   Diverticulitis    ED (erectile dysfunction)    Elevated homocysteine    ESRD (end stage renal disease) on dialysis ([place]) 03/2015   M-W-F dialysis   GERD (gastroesophageal reflux disease)    pepto    Gout    Hiatal hernia    Hyperlipidemia    Hypertension    Hypothyroidism    PVD (peripheral vascular disease) ([place])    has plastic aorta   Renal insufficiency    Sleep apnea    does not wear c-pap    Past Surgical History:  Procedure Laterality Date   ANGIOPLASTY Right 01/26/2021   Procedure: ANGIOPLASTY RIGHT SUBCLAVIAN VEIN;  Surgeon: Serafina Mitchell, MD;  Location: [place];  Service: Vascular;  Laterality: Right;   aortobifemoral bypass     AV FISTULA PLACEMENT Left 12/01/2013   Procedure: ARTERIOVENOUS (AV) FISTULA CREATION- LEFT BRACHIOCEPHALIC;  Surgeon: Angelia Mould, MD;  Location: [place];  Service: Vascular;  Laterality: Left;   [name] Right 07/27/2014   Procedure: BASCILIC VEIN TRANSPOSITION;  Surgeon: Angelia Mould, MD;  Location: [place];  Service: Vascular;  Laterality: Right;   BRAIN SURGERY  07/22/2018   BREAST SURGERY     left - granulomatous mastitis  BURR HOLE Bilateral 07/22/2018   Procedure: BILATERAL BURR HOLES;  Surgeon: Kristeen Miss, MD;  Location: [place];  Service: Neurosurgery;  Laterality: Bilateral;   COLONOSCOPY     ENDOV AAA REPR W MDLR BIF  PROSTH ([place] [facility])  1992   ENTEROSCOPY N/A 06/03/2018   Procedure: ENTEROSCOPY;  Surgeon: Lavena Bullion, DO;  Location: [facility] ENDOSCOPY;  Service: Gastroenterology;  Laterality: N/A;   ENTEROSCOPY N/A 11/20/2018   Procedure: ENTEROSCOPY;  Surgeon: Rush Landmark Telford Nab., MD;  Location: [place];  Service: Gastroenterology;  Laterality: N/A;   ENTEROSCOPY N/A 12/03/2019   Procedure: ENTEROSCOPY;  Surgeon: Lavena Bullion, DO;  Location: [facility] ENDOSCOPY;  Service: Gastroenterology;  Laterality: N/A;  push enteroscopy   ENTEROSCOPY N/A 07/04/2021   Procedure: ENTEROSCOPY;  Surgeon: Sharyn Creamer, MD;  Location: [facility] ENDOSCOPY;  Service: Gastroenterology;  Laterality: N/A;   ENTEROSCOPY N/A 11/05/2021   Procedure: ENTEROSCOPY;  Surgeon: Daryel November, MD;  Location: [facility] ENDOSCOPY;  Service: Gastroenterology;  Laterality: N/A;   EYE SURGERY Bilateral    cataracts   FISTULOGRAM Right 01/26/2021   Procedure: FISTULOGRAM RIGHT;  Surgeon: Serafina Mitchell, MD;  Location: [facility] OR;  Service: Vascular;  Laterality: Right;   HEMODIALYSIS INPATIENT  01/17/2018       HOT HEMOSTASIS N/A 06/03/2018   Procedure: HOT HEMOSTASIS (ARGON PLASMA COAGULATION/BICAP);  Surgeon: Lavena Bullion, DO;  Location: [facility] ENDOSCOPY;  Service: Gastroenterology;  Laterality: N/A;   HOT HEMOSTASIS N/A 11/20/2018   Procedure: HOT HEMOSTASIS (ARGON PLASMA COAGULATION/BICAP);  Surgeon: Irving Copas., MD;  Location: [employer];  Service: Gastroenterology;  Laterality: N/A;   HOT HEMOSTASIS N/A 12/03/2019   Procedure: HOT HEMOSTASIS (ARGON PLASMA COAGULATION/BICAP);  Surgeon: Lavena Bullion, DO;  Location: [facility] ENDOSCOPY;  Service: Gastroenterology;  Laterality: N/A;   INTRAMEDULLARY (IM) NAIL INTERTROCHANTERIC Right 11/08/2020   Procedure: INTRAMEDULLARY (IM) NAIL INTERTROCHANTRIC;  Surgeon: Erle Crocker, MD;  Location: [place];  Service: Orthopedics;  Laterality: Right;   REVISION OF ARTERIOVENOUS GORETEX GRAFT  Right 09/01/2019   Procedure: REVISION OF ARTERIOVENOUS FISTULA RIGHT ARM;  Surgeon: Serafina Mitchell, MD;  Location: [place];  Service: Vascular;  Laterality: Right;   REVISON OF ARTERIOVENOUS FISTULA Left 02/09/2014   Procedure: REVISON OF LEFT ARTERIOVENOUS FISTULA - RESECTION OF RENDUNDANT VEIN;  Surgeon: Angelia Mould, MD;  Location: [place];  Service: Vascular;  Laterality: Left;   REVISON OF ARTERIOVENOUS FISTULA Right 01/26/2021   Procedure: REVISON OF ARTERIOVENOUS FISTULA RIGHT;  Surgeon: Serafina Mitchell, MD;  Location: [place];  Service: Vascular;  Laterality: Right;   SBO with lysis adhesions     SHUNTOGRAM Left 04/19/2014   Procedure: FISTULOGRAM;  Surgeon: Angelia Mould, MD;  Location: [facility] CATH LAB;  Service: Cardiovascular;  Laterality: Left;   UNILATERAL UPPER EXTREMEITY ANGIOGRAM N/A 07/12/2014   Procedure: UNILATERAL UPPER Anselmo Rod;  Surgeon: Angelia Mould, MD;  Location: [facility] CATH LAB;  Service: Cardiovascular;  Laterality: N/A;    Family History  Problem Relation Age of Onset   Aneurysm Mother        brain   Heart disease Father    Stroke Father    Hypertension Father    Diabetes Father    Dementia Neg Hx    Colon cancer Neg Hx    Esophageal cancer Neg Hx    Pancreatic cancer Neg Hx    Prostate cancer Neg Hx    Rectal cancer Neg Hx    Stomach cancer Neg Hx  Social History:  reports that he quit smoking about 27 years ago. His smoking use included cigarettes. He has never been exposed to tobacco smoke. He has never used smokeless tobacco. He reports that he does not drink alcohol and does not use drugs.    Review of Systems      OSTEOPOROSIS:  He has had a history of 2 vertebral fractures He had rib fractures after his fall in October 2019 His T score at the radius was -4.6 and unable to read his hips and spine on the bone densitometry  He had been on Prolia since 5/19 and last injection was in 05/19/20  After discussion with  nephrologist even though he has mild secondary hyperparathyroidism he has been on FORTEO since 7/22 He is injecting it daily with his wife's help No side effects  Calcium previously has been variable, also followed by nephrologist Calcium is now consistently normal but recent labs from dialysis center not available   Lab Results  Component Value Date   PTH 154 (H) 10/06/2014   CALCIUM 9.6 11/14/2021   CAION 1.25 10/02/2021   PHOS 4.1 07/06/2021     Lipid history: He has been treated with Simvastatin 20 mg  by PCP, results as follows    Lab Results  Component Value Date   CHOL 81 05/09/2021   HDL 49.10 05/09/2021   [place] 15 05/09/2021   TRIG 85.0 05/09/2021   CHOLHDL 2 05/09/2021          He has ESRD on dialysis    Most recent eye exam was 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  Is on 50 mcg levothyroxine   Lab Results  Component Value Date   TSH 0.241 (L) 10/02/2021      Physical Examination:  BP (!) 124/46   Pulse 65   Ht _0  (1.727 m)   Wt 150 lb 3.2 oz (68.1 kg)   SpO2 98%   BMI 22.84 kg/m      ASSESSMENT:  Diabetes type 2  See history of present illness for discussion of his current management, blood sugar patterns and problems identified  Current management is Tradjenta alone  A1c is 6.2 but fructosamine is still relatively high at 359  He tends to have readings well over 200 at times depending on his carbohydrate intake and taking Tradjenta alone Also gaining weight  PLAN:   He will start adding PRANDIN and to start with only use it before his night after dialysis or if eating sandwiches at any other time He will take 1 tablet of the 0.5 mg to start with and possibly 2 depending on his readings after eating Blood sugar target 180 after meals If he has high readings after dinner also he may need to take a tablet at that time also Continue Tradjenta Consider follow-up with dietitian  For his  osteoporosis he will continue Forteo which appears to be working safely and will stop in January 2024  There are no Patient Instructions on file for this visit.     Elayne Snare 02/18/2022, 1:53 PM   Note: This office note was prepared with Dragon voice recognition system technology. Any transcriptional errors that result from this process are unintentional.

## 2022-02-16 ENCOUNTER — Other Ambulatory Visit: Payer: Self-pay

## 2022-02-16 DIAGNOSIS — E1165 Type 2 diabetes mellitus with hyperglycemia: Secondary | ICD-10-CM

## 2022-02-16 DIAGNOSIS — E876 Hypokalemia: Secondary | ICD-10-CM | POA: Diagnosis not present

## 2022-02-16 DIAGNOSIS — N2581 Secondary hyperparathyroidism of renal origin: Secondary | ICD-10-CM | POA: Diagnosis not present

## 2022-02-16 DIAGNOSIS — N186 End stage renal disease: Secondary | ICD-10-CM | POA: Diagnosis not present

## 2022-02-16 DIAGNOSIS — Z992 Dependence on renal dialysis: Secondary | ICD-10-CM | POA: Diagnosis not present

## 2022-02-16 DIAGNOSIS — D631 Anemia in chronic kidney disease: Secondary | ICD-10-CM | POA: Diagnosis not present

## 2022-02-16 MED ORDER — FREESTYLE LITE TEST VI STRP: ORAL_STRIP | 2 refills | Status: AC

## 2022-02-19 DIAGNOSIS — N2581 Secondary hyperparathyroidism of renal origin: Secondary | ICD-10-CM | POA: Diagnosis not present

## 2022-02-19 DIAGNOSIS — N186 End stage renal disease: Secondary | ICD-10-CM | POA: Diagnosis not present

## 2022-02-19 DIAGNOSIS — E876 Hypokalemia: Secondary | ICD-10-CM | POA: Diagnosis not present

## 2022-02-19 DIAGNOSIS — D631 Anemia in chronic kidney disease: Secondary | ICD-10-CM | POA: Diagnosis not present

## 2022-02-19 DIAGNOSIS — Z992 Dependence on renal dialysis: Secondary | ICD-10-CM | POA: Diagnosis not present

## 2022-02-21 DIAGNOSIS — N2581 Secondary hyperparathyroidism of renal origin: Secondary | ICD-10-CM | POA: Diagnosis not present

## 2022-02-21 DIAGNOSIS — D631 Anemia in chronic kidney disease: Secondary | ICD-10-CM | POA: Diagnosis not present

## 2022-02-21 DIAGNOSIS — N186 End stage renal disease: Secondary | ICD-10-CM | POA: Diagnosis not present

## 2022-02-21 DIAGNOSIS — Z992 Dependence on renal dialysis: Secondary | ICD-10-CM | POA: Diagnosis not present

## 2022-02-21 DIAGNOSIS — E876 Hypokalemia: Secondary | ICD-10-CM | POA: Diagnosis not present

## 2022-02-22 DIAGNOSIS — N186 End stage renal disease: Secondary | ICD-10-CM | POA: Diagnosis not present

## 2022-02-22 DIAGNOSIS — Z992 Dependence on renal dialysis: Secondary | ICD-10-CM | POA: Diagnosis not present

## 2022-02-22 DIAGNOSIS — E1122 Type 2 diabetes mellitus with diabetic chronic kidney disease: Secondary | ICD-10-CM | POA: Diagnosis not present

## 2022-02-23 DIAGNOSIS — E876 Hypokalemia: Secondary | ICD-10-CM | POA: Diagnosis not present

## 2022-02-23 DIAGNOSIS — N186 End stage renal disease: Secondary | ICD-10-CM | POA: Diagnosis not present

## 2022-02-23 DIAGNOSIS — Z992 Dependence on renal dialysis: Secondary | ICD-10-CM | POA: Diagnosis not present

## 2022-02-23 DIAGNOSIS — E1129 Type 2 diabetes mellitus with other diabetic kidney complication: Secondary | ICD-10-CM | POA: Diagnosis not present

## 2022-02-23 DIAGNOSIS — N2581 Secondary hyperparathyroidism of renal origin: Secondary | ICD-10-CM | POA: Diagnosis not present

## 2022-02-26 DIAGNOSIS — Z992 Dependence on renal dialysis: Secondary | ICD-10-CM | POA: Diagnosis not present

## 2022-02-26 DIAGNOSIS — E1129 Type 2 diabetes mellitus with other diabetic kidney complication: Secondary | ICD-10-CM | POA: Diagnosis not present

## 2022-02-26 DIAGNOSIS — N186 End stage renal disease: Secondary | ICD-10-CM | POA: Diagnosis not present

## 2022-02-26 DIAGNOSIS — E876 Hypokalemia: Secondary | ICD-10-CM | POA: Diagnosis not present

## 2022-02-26 DIAGNOSIS — N2581 Secondary hyperparathyroidism of renal origin: Secondary | ICD-10-CM | POA: Diagnosis not present

## 2022-02-28 DIAGNOSIS — N186 End stage renal disease: Secondary | ICD-10-CM | POA: Diagnosis not present

## 2022-02-28 DIAGNOSIS — E876 Hypokalemia: Secondary | ICD-10-CM | POA: Diagnosis not present

## 2022-02-28 DIAGNOSIS — Z992 Dependence on renal dialysis: Secondary | ICD-10-CM | POA: Diagnosis not present

## 2022-02-28 DIAGNOSIS — E1129 Type 2 diabetes mellitus with other diabetic kidney complication: Secondary | ICD-10-CM | POA: Diagnosis not present

## 2022-02-28 DIAGNOSIS — N2581 Secondary hyperparathyroidism of renal origin: Secondary | ICD-10-CM | POA: Diagnosis not present

## 2022-03-02 DIAGNOSIS — E1129 Type 2 diabetes mellitus with other diabetic kidney complication: Secondary | ICD-10-CM | POA: Diagnosis not present

## 2022-03-02 DIAGNOSIS — N2581 Secondary hyperparathyroidism of renal origin: Secondary | ICD-10-CM | POA: Diagnosis not present

## 2022-03-02 DIAGNOSIS — N186 End stage renal disease: Secondary | ICD-10-CM | POA: Diagnosis not present

## 2022-03-02 DIAGNOSIS — Z992 Dependence on renal dialysis: Secondary | ICD-10-CM | POA: Diagnosis not present

## 2022-03-02 DIAGNOSIS — E876 Hypokalemia: Secondary | ICD-10-CM | POA: Diagnosis not present

## 2022-03-03 ENCOUNTER — Other Ambulatory Visit: Payer: Self-pay | Admitting: Endocrinology

## 2022-03-05 DIAGNOSIS — N2581 Secondary hyperparathyroidism of renal origin: Secondary | ICD-10-CM | POA: Diagnosis not present

## 2022-03-05 DIAGNOSIS — E876 Hypokalemia: Secondary | ICD-10-CM | POA: Diagnosis not present

## 2022-03-05 DIAGNOSIS — Z992 Dependence on renal dialysis: Secondary | ICD-10-CM | POA: Diagnosis not present

## 2022-03-05 DIAGNOSIS — N186 End stage renal disease: Secondary | ICD-10-CM | POA: Diagnosis not present

## 2022-03-05 DIAGNOSIS — E1129 Type 2 diabetes mellitus with other diabetic kidney complication: Secondary | ICD-10-CM | POA: Diagnosis not present

## 2022-03-07 DIAGNOSIS — N186 End stage renal disease: Secondary | ICD-10-CM | POA: Diagnosis not present

## 2022-03-07 DIAGNOSIS — Z992 Dependence on renal dialysis: Secondary | ICD-10-CM | POA: Diagnosis not present

## 2022-03-07 DIAGNOSIS — N2581 Secondary hyperparathyroidism of renal origin: Secondary | ICD-10-CM | POA: Diagnosis not present

## 2022-03-07 DIAGNOSIS — E1129 Type 2 diabetes mellitus with other diabetic kidney complication: Secondary | ICD-10-CM | POA: Diagnosis not present

## 2022-03-07 DIAGNOSIS — E876 Hypokalemia: Secondary | ICD-10-CM | POA: Diagnosis not present

## 2022-03-09 DIAGNOSIS — N2581 Secondary hyperparathyroidism of renal origin: Secondary | ICD-10-CM | POA: Diagnosis not present

## 2022-03-09 DIAGNOSIS — E876 Hypokalemia: Secondary | ICD-10-CM | POA: Diagnosis not present

## 2022-03-09 DIAGNOSIS — E1129 Type 2 diabetes mellitus with other diabetic kidney complication: Secondary | ICD-10-CM | POA: Diagnosis not present

## 2022-03-09 DIAGNOSIS — Z992 Dependence on renal dialysis: Secondary | ICD-10-CM | POA: Diagnosis not present

## 2022-03-09 DIAGNOSIS — N186 End stage renal disease: Secondary | ICD-10-CM | POA: Diagnosis not present

## 2022-03-12 DIAGNOSIS — N2581 Secondary hyperparathyroidism of renal origin: Secondary | ICD-10-CM | POA: Diagnosis not present

## 2022-03-12 DIAGNOSIS — Z992 Dependence on renal dialysis: Secondary | ICD-10-CM | POA: Diagnosis not present

## 2022-03-12 DIAGNOSIS — N186 End stage renal disease: Secondary | ICD-10-CM | POA: Diagnosis not present

## 2022-03-12 DIAGNOSIS — E1129 Type 2 diabetes mellitus with other diabetic kidney complication: Secondary | ICD-10-CM | POA: Diagnosis not present

## 2022-03-12 DIAGNOSIS — E876 Hypokalemia: Secondary | ICD-10-CM | POA: Diagnosis not present

## 2022-03-14 ENCOUNTER — Other Ambulatory Visit: Payer: Self-pay | Admitting: *Deleted

## 2022-03-14 DIAGNOSIS — Z992 Dependence on renal dialysis: Secondary | ICD-10-CM | POA: Diagnosis not present

## 2022-03-14 DIAGNOSIS — N186 End stage renal disease: Secondary | ICD-10-CM | POA: Diagnosis not present

## 2022-03-14 DIAGNOSIS — E876 Hypokalemia: Secondary | ICD-10-CM | POA: Diagnosis not present

## 2022-03-14 DIAGNOSIS — N2581 Secondary hyperparathyroidism of renal origin: Secondary | ICD-10-CM | POA: Diagnosis not present

## 2022-03-14 DIAGNOSIS — E1129 Type 2 diabetes mellitus with other diabetic kidney complication: Secondary | ICD-10-CM | POA: Diagnosis not present

## 2022-03-14 NOTE — Patient Outreach (Addendum)
[name]) Care Management Telephonic RN Care Manager Note   03/14/2022 Name:  [name] MRN:  [id number] DOB:  March 19, 1941  Summary: successful outreach to [phone] Wife [name] answered She reported Mr Mercier is doing well She confirms Mr Copado is receiving services from [facility] and is a 100 % connected veteran - tricare coverage She denies any services are needed from [name] at this time She pleasantly thanked RN CM for the outreach and agreed to case closure She does agree to have RN CM send a letter to the home for possible outreach to [place] [name] if services are needed in the future  Recommendations/Changes made from today's visit: Initial outreach, assessment of needs, needs denied Case closure No needs identified  Letters to pt and MD for case closure   Subjective: [name] is an 81 y.o. year old male who is a primary patient of Baxley, Cresenciano Lick, MD. The care management team was consulted for assistance with care management and/or care coordination needs.    Telephonic RN Care Manager completed Telephone Visit today.   Objective:  Medications Reviewed Today     Reviewed by Cinda Quest, [place] (Certified Medical Assistant) on 02/15/22 at 73  Med List Status: <None>   Medication Order Taking? Sig Documenting Provider Last Dose Status Informant  acetaminophen (TYLENOL) 325 MG tablet [id number] Yes Take 1-2 tablets (325-650 mg total) by mouth every 4 (four) hours as needed for mild pain. Bary Leriche, PA-C Taking Active Spouse/Significant Other  albuterol (VENTOLIN HFA) 108 (90 Base) MCG/ACT inhaler [id number] Yes Inhale 1 puff into the lungs daily as needed for wheezing or shortness of breath. [provider] Taking Active Spouse/Significant Other  allopurinol (ZYLOPRIM) 100 MG tablet [id number] Yes Take 100 mg by mouth daily. [provider] Taking Active Spouse/Significant Other  Blood Glucose Monitoring Suppl (FREESTYLE FREEDOM LITE)  w/Device KIT [id number] Yes Use to check blood sugar 2 times per day dx code E11.65 Elayne Snare, MD Taking Active Spouse/Significant Other  camphor-menthol [facility]) lotion [id number] Yes Apply 1 application topically 4 (four) times daily as needed for itching.  [provider] Taking Active Spouse/Significant Other  Carboxymethylcellulose Sodium (THERATEARS) 0.25 % SOLN [id number] Yes Place 1 drop into both eyes daily as needed (for dryness). [provider] Taking Active Spouse/Significant Other  cetirizine (ZYRTEC) 10 MG tablet [id number] Yes Take 10 mg by mouth daily as needed for allergies. [provider] Taking Active Spouse/Significant Other  dicyclomine (BENTYL) 10 MG capsule [id number] Yes Take 1 capsule (10 mg total) by mouth every 8 (eight) hours as needed for spasms. Cirigliano, Vito V, DO Taking Active   donepezil (ARICEPT) 10 MG tablet [id number] Yes Take 1 tablet (10 mg total) by mouth at bedtime. Melvenia Beam, MD Taking Active Spouse/Significant Other  fluticasone (FLONASE) 50 MCG/ACT nasal spray [id number] Yes Place 1 spray into both nostrils daily.  Patient taking differently: Place 1 spray into both nostrils daily as needed for allergies.   Cristal Ford, DO Taking Active Spouse/Significant Other  glucose blood (FREESTYLE LITE) test strip [id number] Yes USE AS DIRECTED THREE TIMES DAILY  Patient taking differently: Check blood sugar bid   Elayne Snare, MD Taking Active Spouse/Significant Other  hydrocortisone 2.5 % cream [id number] Yes Apply 1 application topically 3 (three) times daily as needed (skin irritation (legs & arms)).  [provider] Taking Active Spouse/Significant Other  Lancets (FREESTYLE) lancets [id number] Yes Use as instructed to check blood sugar 2 times per day  dx code E11.65 Elayne Snare, MD Taking Active Spouse/Significant Other  levothyroxine (SYNTHROID, LEVOTHROID) 50 MCG tablet [id number] Yes Take 1 tablet (50 mcg total) by mouth  daily.  Patient taking differently: Take 50 mcg by mouth daily before breakfast.   Elby Showers, MD Taking Active Spouse/Significant Other  lidocaine (LIDODERM) 5 % [id number] Yes Place 1 patch onto the skin daily as needed (back pain). [provider] Taking Active Spouse/Significant Other  memantine (NAMENDA) 10 MG tablet [id number] Yes Take 1 tablet (10 mg total) by mouth 2 (two) times daily. Melvenia Beam, MD Taking Active Spouse/Significant Other  midodrine (PROAMATINE) 10 MG tablet [id number] Yes Take one pill prior to hemodialysis on MWF  Patient taking differently: Take 5 mg by mouth See admin instructions. Take one tablet by mouth prior to hemodialysis on MWF   Love, Ivan Anchors, PA-C Taking Active Spouse/Significant Other  Nutritional Supplements (FEEDING SUPPLEMENT, NEPRO CARB STEADY,) LIQD [id number] Yes Take 237 mLs by mouth daily. [provider] Taking Active Spouse/Significant Other  omeprazole (PRILOSEC) 40 MG capsule [id number] Yes Take 1 capsule (40 mg total) by mouth daily. Esterwood, Amy S, PA-C Taking Active Spouse/Significant Other  sevelamer carbonate (RENVELA) 800 MG tablet [id number] Yes Take 1,600-2,400 mg by mouth See admin instructions. Take 2,400 mg by mouth three times a day with meals and 1,600 mg with snacks per spouse [provider] Taking Active Spouse/Significant Other  simvastatin (ZOCOR) 20 MG tablet [id number] Yes Take 20 mg by mouth at bedtime. [provider] Taking Active Spouse/Significant Other  Teriparatide, Recombinant, (FORTEO) 600 MCG/2.4ML SOPN [id number] Yes Inject 20 mcg subcutaneously daily  Patient taking differently: Inject 20 mcg into the skin at bedtime.   Elayne Snare, MD Taking Active Spouse/Significant Other  TRADJENTA 5 MG TABS tablet [id number] Yes Take 1 tablet (5 mg total) by mouth daily. Elayne Snare, MD Taking Active   Med List Note [name], CPhT 07/17/20 1608): Dialysis Monday, Wednesday, Friday [name] [place]            Patient Active Problem List   Diagnosis Date Noted   UTI (urinary tract infection) 11/05/2021   Pressure injury of skin 11/05/2021   Acute blood loss anemia    Acute GI bleeding 11/04/2021   Pulmonary nodule 10/05/2021   Acute encephalopathy 10/02/2021   Hypernatremia 10/02/2021   Elevated troponin level not due myocardial infarction 07/04/2021   Mixed diabetic hyperlipidemia associated with type 2 diabetes mellitus ([place]) 07/04/2021   Alzheimer's disease, unspecified (CODE) ([place]) 01/24/2021   Senile nuclear sclerosis 11/22/2020   Other specified erythematous condition 11/22/2020   Other specified disease of sebaceous glands 11/22/2020   Osteoporosis 11/22/2020   Obesity 11/22/2020   Macrocytosis 11/22/2020   Dystrophia unguium 11/22/2020   Tinea pedis 11/22/2020   Pre-operative clearance    Closed nondisplaced intertrochanteric fracture of right femur ([place]) 11/07/2020   Hip fracture ([place]) 11/07/2020   Fall    Rectal bleeding 04/22/2020   AVM (arteriovenous malformation) of small bowel, acquired    Hiatal hernia    Schatzki's ring of distal esophagus    Other bacterial infections of unspecified site 08/31/2019   Hypocalcemia 04/06/2019   Melena 11/19/2018   Leukocytosis    Anemia    TBI (traumatic brain injury) ([place]) 07/29/2018   Diabetes mellitus type 2 in nonobese (HCC)    Loose stools    Dementia without behavioral disturbance ([place])    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[place]) 07/21/2018   Multiple  fractures of ribs, left side, initial encounter for closed fracture 07/21/2018   Subdural hematoma ([place]) 07/21/2018   Gastritis, unspecified, with bleeding 06/04/2018   Angiodysplasia of stomach    Occult GI bleeding    GIB (gastrointestinal bleeding) 06/01/2018   Low back pain 01/17/2018   Anorexia    Diverticulitis of intestine, part unspecified, without perforation or abscess without bleeding 08/07/2017   Dyspnea    Macrocytic  anemia 06/04/2016   Thrombocytopenia ([place]) 06/04/2016   Exertional dyspnea 06/04/2016   Arm paresthesia, left 06/04/2016   Dyspnea on exertion 06/04/2016   Tenderness of right calf 06/04/2016   Mild cognitive impairment with memory loss 01/12/2016   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   Hyperlipidemia, unspecified 02/25/2015   Pruritus, unspecified 02/25/2015   Pain, unspecified 02/25/2015   Other specified coagulation defects ([place]) 02/25/2015   Iron deficiency anemia, unspecified 02/25/2015   Headache, unspecified 02/25/2015   Gout, unspecified 02/25/2015   Fever, unspecified 02/25/2015   Diarrhea, unspecified 02/25/2015   Anemia in chronic kidney disease 02/25/2015   Acute embolism and thrombosis of left subclavian vein ([place]) 02/25/2015   Hypoglycemia    Weakness 02/18/2015   OSA (obstructive sleep apnea) 02/02/2015   Hyperkalemia 10/05/2014   End stage renal disease ([place]) 11/18/2013   BPH (benign prostatic hyperplasia) 11/22/2012   Peripheral vascular disease ([place]) 12/24/2011   Hyperparathyroidism ([place]) 12/24/2011   Hypothyroidism 12/24/2011   Allergic rhinitis 12/24/2011   Erectile dysfunction 12/24/2011   Insulin dependent diabetes mellitus 09/03/2008   PANCREATITIS, HX OF 08/30/2008   RENAL FAILURE, ACUTE, HX OF 08/30/2008   DIVERTICULOSIS, COLON 06/08/2003     SDOH:  (Social Determinants of Health) assessments and interventions performed:    Care Plan  Review of patient past medical history, allergies, medications, health status, including review of consultants reports, laboratory and other test data, was performed as part of comprehensive evaluation for care management services.   There are no care plans that you recently modified to display for this patient.    Plan: The patient has been provided with contact information for the care management team and has been advised to call with any health related questions or concerns.  No further follow up required: case  closure no  needs identified No services preferred per wife  Joelene Millin L. Lavina Hamman, RN, BSN, [place] Coordinator Office number [phone] [facility] number [phone] Fax number [phone]

## 2022-03-16 DIAGNOSIS — Z992 Dependence on renal dialysis: Secondary | ICD-10-CM | POA: Diagnosis not present

## 2022-03-16 DIAGNOSIS — E1129 Type 2 diabetes mellitus with other diabetic kidney complication: Secondary | ICD-10-CM | POA: Diagnosis not present

## 2022-03-16 DIAGNOSIS — E876 Hypokalemia: Secondary | ICD-10-CM | POA: Diagnosis not present

## 2022-03-16 DIAGNOSIS — N186 End stage renal disease: Secondary | ICD-10-CM | POA: Diagnosis not present

## 2022-03-16 DIAGNOSIS — N2581 Secondary hyperparathyroidism of renal origin: Secondary | ICD-10-CM | POA: Diagnosis not present

## 2022-03-19 DIAGNOSIS — E1129 Type 2 diabetes mellitus with other diabetic kidney complication: Secondary | ICD-10-CM | POA: Diagnosis not present

## 2022-03-19 DIAGNOSIS — Z992 Dependence on renal dialysis: Secondary | ICD-10-CM | POA: Diagnosis not present

## 2022-03-19 DIAGNOSIS — N186 End stage renal disease: Secondary | ICD-10-CM | POA: Diagnosis not present

## 2022-03-19 DIAGNOSIS — E876 Hypokalemia: Secondary | ICD-10-CM | POA: Diagnosis not present

## 2022-03-19 DIAGNOSIS — N2581 Secondary hyperparathyroidism of renal origin: Secondary | ICD-10-CM | POA: Diagnosis not present

## 2022-03-21 DIAGNOSIS — N2581 Secondary hyperparathyroidism of renal origin: Secondary | ICD-10-CM | POA: Diagnosis not present

## 2022-03-21 DIAGNOSIS — E1129 Type 2 diabetes mellitus with other diabetic kidney complication: Secondary | ICD-10-CM | POA: Diagnosis not present

## 2022-03-21 DIAGNOSIS — E876 Hypokalemia: Secondary | ICD-10-CM | POA: Diagnosis not present

## 2022-03-21 DIAGNOSIS — N186 End stage renal disease: Secondary | ICD-10-CM | POA: Diagnosis not present

## 2022-03-21 DIAGNOSIS — Z992 Dependence on renal dialysis: Secondary | ICD-10-CM | POA: Diagnosis not present

## 2022-03-23 DIAGNOSIS — N186 End stage renal disease: Secondary | ICD-10-CM | POA: Diagnosis not present

## 2022-03-23 DIAGNOSIS — Z992 Dependence on renal dialysis: Secondary | ICD-10-CM | POA: Diagnosis not present

## 2022-03-23 DIAGNOSIS — E876 Hypokalemia: Secondary | ICD-10-CM | POA: Diagnosis not present

## 2022-03-23 DIAGNOSIS — E1129 Type 2 diabetes mellitus with other diabetic kidney complication: Secondary | ICD-10-CM | POA: Diagnosis not present

## 2022-03-23 DIAGNOSIS — N2581 Secondary hyperparathyroidism of renal origin: Secondary | ICD-10-CM | POA: Diagnosis not present

## 2022-03-24 DIAGNOSIS — Z992 Dependence on renal dialysis: Secondary | ICD-10-CM | POA: Diagnosis not present

## 2022-03-24 DIAGNOSIS — E1122 Type 2 diabetes mellitus with diabetic chronic kidney disease: Secondary | ICD-10-CM | POA: Diagnosis not present

## 2022-03-24 DIAGNOSIS — N186 End stage renal disease: Secondary | ICD-10-CM | POA: Diagnosis not present

## 2022-03-26 DIAGNOSIS — E876 Hypokalemia: Secondary | ICD-10-CM | POA: Diagnosis not present

## 2022-03-26 DIAGNOSIS — N186 End stage renal disease: Secondary | ICD-10-CM | POA: Diagnosis not present

## 2022-03-26 DIAGNOSIS — Z992 Dependence on renal dialysis: Secondary | ICD-10-CM | POA: Diagnosis not present

## 2022-03-26 DIAGNOSIS — E1129 Type 2 diabetes mellitus with other diabetic kidney complication: Secondary | ICD-10-CM | POA: Diagnosis not present

## 2022-03-26 DIAGNOSIS — N2581 Secondary hyperparathyroidism of renal origin: Secondary | ICD-10-CM | POA: Diagnosis not present

## 2022-03-28 DIAGNOSIS — E1129 Type 2 diabetes mellitus with other diabetic kidney complication: Secondary | ICD-10-CM | POA: Diagnosis not present

## 2022-03-28 DIAGNOSIS — N2581 Secondary hyperparathyroidism of renal origin: Secondary | ICD-10-CM | POA: Diagnosis not present

## 2022-03-28 DIAGNOSIS — Z992 Dependence on renal dialysis: Secondary | ICD-10-CM | POA: Diagnosis not present

## 2022-03-28 DIAGNOSIS — N186 End stage renal disease: Secondary | ICD-10-CM | POA: Diagnosis not present

## 2022-03-28 DIAGNOSIS — E876 Hypokalemia: Secondary | ICD-10-CM | POA: Diagnosis not present

## 2022-03-30 DIAGNOSIS — E1129 Type 2 diabetes mellitus with other diabetic kidney complication: Secondary | ICD-10-CM | POA: Diagnosis not present

## 2022-03-30 DIAGNOSIS — Z992 Dependence on renal dialysis: Secondary | ICD-10-CM | POA: Diagnosis not present

## 2022-03-30 DIAGNOSIS — E876 Hypokalemia: Secondary | ICD-10-CM | POA: Diagnosis not present

## 2022-03-30 DIAGNOSIS — N186 End stage renal disease: Secondary | ICD-10-CM | POA: Diagnosis not present

## 2022-03-30 DIAGNOSIS — N2581 Secondary hyperparathyroidism of renal origin: Secondary | ICD-10-CM | POA: Diagnosis not present

## 2022-04-02 DIAGNOSIS — E1129 Type 2 diabetes mellitus with other diabetic kidney complication: Secondary | ICD-10-CM | POA: Diagnosis not present

## 2022-04-02 DIAGNOSIS — N186 End stage renal disease: Secondary | ICD-10-CM | POA: Diagnosis not present

## 2022-04-02 DIAGNOSIS — Z992 Dependence on renal dialysis: Secondary | ICD-10-CM | POA: Diagnosis not present

## 2022-04-02 DIAGNOSIS — E876 Hypokalemia: Secondary | ICD-10-CM | POA: Diagnosis not present

## 2022-04-02 DIAGNOSIS — N2581 Secondary hyperparathyroidism of renal origin: Secondary | ICD-10-CM | POA: Diagnosis not present

## 2022-04-04 DIAGNOSIS — Z992 Dependence on renal dialysis: Secondary | ICD-10-CM | POA: Diagnosis not present

## 2022-04-04 DIAGNOSIS — E1129 Type 2 diabetes mellitus with other diabetic kidney complication: Secondary | ICD-10-CM | POA: Diagnosis not present

## 2022-04-04 DIAGNOSIS — E876 Hypokalemia: Secondary | ICD-10-CM | POA: Diagnosis not present

## 2022-04-04 DIAGNOSIS — N186 End stage renal disease: Secondary | ICD-10-CM | POA: Diagnosis not present

## 2022-04-04 DIAGNOSIS — N2581 Secondary hyperparathyroidism of renal origin: Secondary | ICD-10-CM | POA: Diagnosis not present

## 2022-04-05 ENCOUNTER — Encounter: Payer: Medicare Other | Attending: Psychology | Admitting: Psychology

## 2022-04-05 DIAGNOSIS — R413 Other amnesia: Secondary | ICD-10-CM

## 2022-04-05 DIAGNOSIS — Z87820 Personal history of traumatic brain injury: Secondary | ICD-10-CM

## 2022-04-05 DIAGNOSIS — F0390 Unspecified dementia without behavioral disturbance: Secondary | ICD-10-CM | POA: Diagnosis not present

## 2022-04-05 NOTE — Progress Notes (Signed)
04/05/2022 10 AM-11 AM: The patient returns for follow-up appointment along with his wife.  I last saw the patient for formal neuropsychological evaluation just over a year ago with complete neuropsychological evaluation from that time being able to be found in the patient's EMR dated 12/20/2020.  I will include a copy of the impression/summary below for convenience.  The patient today tended to minimize symptoms and reports that things are going "so-so."  He also reports that things are pretty good.  The patient reports that he does not think anything is wrong with his mind other than taking longer to think of what he wants to say.  The patient had a positive mood state and while he did not remember his previous visits to our office did not appear to be distressed in any way.  The patient's wife reports that she sees progressive changes with his memory getting more problematic especially the short-term memory.  She reports that she is now seeing even changes in long-term memory as well.  The patient reports that his difficulties have been progressively worsening over the past year.  She reports that the patient also complains of a constant headache that began approximately 6 months prior.  He also was [place] complaining of feeling very itchy and they have tried Benadryl with some limited success.  The patient is receiving hemodialysis and while he reports acute worsening of itchiness his complaints of itching have been persistent for some time.  The patient reports that he is constantly "pulling something out of his head, ear or other places on his body that he feels are bugs or some type of substance being excreted from his body.  The patient describes these as somewhat delusional in nature but he becomes very fixated on his tactile sensations.  Patient's wife denies any indications of recent visual hallucination but has had times where he will say he is seeing bugs or insects flying to the air.  The patient has  had cataracts removed and likely has some floaters.  The patient continues to take Aricept and the Namenda and patient and wife are unsure whether this is particularly helpful for him.  Going through the symptoms I really do not think that repeat testing is particular warranted at this time.  The patient has been progressively worsening which is expected given the likely components of past subdural hematoma with most recent MRI not showing any vascular or structural changes related to new vascular events.  The patient likely has some type of degenerative condition most likely Alzheimer's along with his vascular issues but we cannot rule out Lewy body.  The patient has been seen by palliative care and they are looking at what to do as far as managing him going forward.  I do have 1 recommendation for Dr. Lavell Anchors.  Because of the patient's significant headaches and his reports and wife's reports of how much of a negative impact it has on him we may want to consider stopping Aricept at least for a month or so to see if his headaches become less frequent.  This is a possible side effect of Aricept and while it likely is not the culprit it would be worthwhile assessing this possibility.  It is unclear how much Aricept is helping his overall symptoms and the patient has significant cognitive deficits.  I did address planning issues with the patient's wife and encouraged them to make decisions so they can better communicate with palliative care intentions going forward for we are placing  the situation at decisions have to be made acutely.   Impression/Diagnosis:                     The results of the current neuropsychological evaluation showed consistent and profound cognitive deficits on objective neuropsychological measures.  The patient shows only mild difficulties with auditory encoding which likely allow him to engage in conversational activities.  However, the patient shows severe deficits in his ability  to organize his story information or have it available for later recall.  There are severe deficits with regard to visual-spatial abilities and visual constructional abilities, information processing speed measures expressive language abilities.  The most profound deficits had to do with both immediate and delayed memory and learning.   As far as diagnostic considerations it is hard to identify a clear in particular etiological factor.  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  PET scan indicates some mild relative cortical metabolism within bilateral parietal regions as well as left occipital lobe.  The patient also shows a number of abnormalities on his most recent MRI/CT scans showing subdural hematomas over the frontal convexities with left greater than right as well as moderate generalized cortical atrophy and mild corpus callosum atrophy.  The patient also showed patterns consistent with mild chronic microvascular ischemic changes.  The patient is having some types of visual and tactile hallucinations that would be consistent with Lewy body type dementia versus underlying Alzheimer's type dementia with superimposed vascular changes from previous cerebrovascular events in frontal regions and something going on in his occipital region and parietal regions.  The patient also end-stage renal disease and significant diabetic condition which may be playing a role peripheral neuropathy and changes in tactile sensation.  Again, it is going to be very difficult to identify a particular etiological factor in whether these are all related to his cerebrovascular events and progressive changes or an underlying Alzheimer/Lewy body type condition.  There does not appear to be significant subcortical involvement beyond mild microvascular ischemic changes in white matter and corpus  callosum regions.   Diagnosis:                                Dementia without behavioral disturbance, unspecified dementia type ([place])   Memory loss   History of traumatic brain injury     _____________________ Ilean Skill, Psy.D. Clinical Neuropsychologist

## 2022-04-06 DIAGNOSIS — Z992 Dependence on renal dialysis: Secondary | ICD-10-CM | POA: Diagnosis not present

## 2022-04-06 DIAGNOSIS — N186 End stage renal disease: Secondary | ICD-10-CM | POA: Diagnosis not present

## 2022-04-06 DIAGNOSIS — E876 Hypokalemia: Secondary | ICD-10-CM | POA: Diagnosis not present

## 2022-04-06 DIAGNOSIS — E1129 Type 2 diabetes mellitus with other diabetic kidney complication: Secondary | ICD-10-CM | POA: Diagnosis not present

## 2022-04-06 DIAGNOSIS — N2581 Secondary hyperparathyroidism of renal origin: Secondary | ICD-10-CM | POA: Diagnosis not present

## 2022-04-09 ENCOUNTER — Encounter (HOSPITAL_COMMUNITY): Payer: Self-pay | Admitting: Emergency Medicine

## 2022-04-09 ENCOUNTER — Emergency Department (HOSPITAL_COMMUNITY): Payer: Medicare Other

## 2022-04-09 ENCOUNTER — Other Ambulatory Visit: Payer: Self-pay

## 2022-04-09 ENCOUNTER — Emergency Department (HOSPITAL_COMMUNITY)
Admission: EM | Admit: 2022-04-09 | Discharge: 2022-04-10 | Disposition: A | Discharge status: Home / Self Care | Payer: Medicare Other | Attending: Emergency Medicine | Admitting: Emergency Medicine

## 2022-04-09 DIAGNOSIS — Z7984 Long term (current) use of oral hypoglycemic drugs: Secondary | ICD-10-CM | POA: Diagnosis not present

## 2022-04-09 DIAGNOSIS — R5383 Other fatigue: Secondary | ICD-10-CM | POA: Insufficient documentation

## 2022-04-09 DIAGNOSIS — E039 Hypothyroidism, unspecified: Secondary | ICD-10-CM | POA: Insufficient documentation

## 2022-04-09 DIAGNOSIS — G309 Alzheimer's disease, unspecified: Secondary | ICD-10-CM | POA: Diagnosis not present

## 2022-04-09 DIAGNOSIS — N3289 Other specified disorders of bladder: Secondary | ICD-10-CM | POA: Diagnosis not present

## 2022-04-09 DIAGNOSIS — I2510 Atherosclerotic heart disease of native coronary artery without angina pectoris: Secondary | ICD-10-CM | POA: Insufficient documentation

## 2022-04-09 DIAGNOSIS — R410 Disorientation, unspecified: Secondary | ICD-10-CM | POA: Insufficient documentation

## 2022-04-09 DIAGNOSIS — R1032 Left lower quadrant pain: Secondary | ICD-10-CM | POA: Diagnosis not present

## 2022-04-09 DIAGNOSIS — N186 End stage renal disease: Secondary | ICD-10-CM | POA: Diagnosis not present

## 2022-04-09 DIAGNOSIS — R1030 Lower abdominal pain, unspecified: Secondary | ICD-10-CM

## 2022-04-09 DIAGNOSIS — I120 Hypertensive chronic kidney disease with stage 5 chronic kidney disease or end stage renal disease: Secondary | ICD-10-CM | POA: Diagnosis not present

## 2022-04-09 DIAGNOSIS — F0280 Dementia in other diseases classified elsewhere without behavioral disturbance: Secondary | ICD-10-CM | POA: Diagnosis not present

## 2022-04-09 DIAGNOSIS — E1122 Type 2 diabetes mellitus with diabetic chronic kidney disease: Secondary | ICD-10-CM | POA: Diagnosis not present

## 2022-04-09 DIAGNOSIS — Z992 Dependence on renal dialysis: Secondary | ICD-10-CM | POA: Diagnosis not present

## 2022-04-09 DIAGNOSIS — L905 Scar conditions and fibrosis of skin: Secondary | ICD-10-CM | POA: Diagnosis not present

## 2022-04-09 DIAGNOSIS — Z79899 Other long term (current) drug therapy: Secondary | ICD-10-CM | POA: Diagnosis not present

## 2022-04-09 DIAGNOSIS — K4090 Unilateral inguinal hernia, without obstruction or gangrene, not specified as recurrent: Secondary | ICD-10-CM | POA: Diagnosis not present

## 2022-04-09 DIAGNOSIS — R1084 Generalized abdominal pain: Secondary | ICD-10-CM | POA: Diagnosis not present

## 2022-04-09 DIAGNOSIS — R4182 Altered mental status, unspecified: Secondary | ICD-10-CM | POA: Diagnosis not present

## 2022-04-09 DIAGNOSIS — N390 Urinary tract infection, site not specified: Secondary | ICD-10-CM | POA: Insufficient documentation

## 2022-04-09 LAB — COMPREHENSIVE METABOLIC PANEL
ALT: 12 U/L (ref 0–44)
AST: 19 U/L (ref 15–41)
Albumin: 3.6 g/dL (ref 3.5–5.0)
Alkaline Phosphatase: 166 U/L — ABNORMAL HIGH (ref 38–126)
Anion gap: 20 — ABNORMAL HIGH (ref 5–15)
BUN: 81 mg/dL — ABNORMAL HIGH (ref 8–23)
CO2: 25 mmol/L (ref 22–32)
Calcium: 9.4 mg/dL (ref 8.9–10.3)
Chloride: 92 mmol/L — ABNORMAL LOW (ref 98–111)
Creatinine, Ser: 11.51 mg/dL — ABNORMAL HIGH (ref 0.61–1.24)
GFR, Estimated: 4 mL/min — ABNORMAL LOW (ref >60)
Glucose, Bld: 174 mg/dL — ABNORMAL HIGH (ref 70–99)
Potassium: 4.4 mmol/L (ref 3.5–5.1)
Sodium: 137 mmol/L (ref 135–145)
Total Bilirubin: 0.4 mg/dL (ref 0.3–1.2)
Total Protein: 6.8 g/dL (ref 6.5–8.1)

## 2022-04-09 LAB — CBC WITH DIFFERENTIAL/PLATELET
Abs Immature Granulocytes: 0.03 10*3/uL (ref 0.00–0.07)
Basophils Absolute: 0.1 10*3/uL (ref 0.0–0.1)
Basophils Relative: 1 %
Eosinophils Absolute: 0.4 10*3/uL (ref 0.0–0.5)
Eosinophils Relative: 4 %
HCT: 35.6 % — ABNORMAL LOW (ref 39.0–52.0)
Hemoglobin: 11.4 g/dL — ABNORMAL LOW (ref 13.0–17.0)
Immature Granulocytes: 0 %
Lymphocytes Relative: 18 %
Lymphs Abs: 1.8 10*3/uL (ref 0.7–4.0)
MCH: 32.7 pg (ref 26.0–34.0)
MCHC: 32 g/dL (ref 30.0–36.0)
MCV: 102 fL — ABNORMAL HIGH (ref 80.0–100.0)
Monocytes Absolute: 0.8 10*3/uL (ref 0.1–1.0)
Monocytes Relative: 8 %
Neutro Abs: 6.9 10*3/uL (ref 1.7–7.7)
Neutrophils Relative %: 69 %
Platelets: 214 10*3/uL (ref 150–400)
RBC: 3.49 MIL/uL — ABNORMAL LOW (ref 4.22–5.81)
RDW: 14.9 % (ref 11.5–15.5)
WBC: 9.9 10*3/uL (ref 4.0–10.5)
nRBC: 0 % (ref 0.0–0.2)

## 2022-04-09 LAB — I-STAT CHEM 8, ED
BUN: 73 mg/dL — ABNORMAL HIGH (ref 8–23)
Calcium, Ion: 1.08 mmol/L — ABNORMAL LOW (ref 1.15–1.40)
Chloride: 96 mmol/L — ABNORMAL LOW (ref 98–111)
Creatinine, Ser: 12 mg/dL — ABNORMAL HIGH (ref 0.61–1.24)
Glucose, Bld: 177 mg/dL — ABNORMAL HIGH (ref 70–99)
HCT: 38 % — ABNORMAL LOW (ref 39.0–52.0)
Hemoglobin: 12.9 g/dL — ABNORMAL LOW (ref 13.0–17.0)
Potassium: 4.4 mmol/L (ref 3.5–5.1)
Sodium: 136 mmol/L (ref 135–145)
TCO2: 27 mmol/L (ref 22–32)

## 2022-04-09 LAB — URINALYSIS, ROUTINE W REFLEX MICROSCOPIC
Bilirubin Urine: NEGATIVE
Glucose, UA: NEGATIVE mg/dL
Ketones, ur: NEGATIVE mg/dL
Nitrite: NEGATIVE
Protein, ur: >=300 mg/dL — AB
RBC / HPF: >50 RBC/hpf — ABNORMAL HIGH (ref 0–5)
Specific Gravity, Urine: 1.013 (ref 1.005–1.030)
WBC, UA: >50 WBC/hpf — ABNORMAL HIGH (ref 0–5)
pH: 8 (ref 5.0–8.0)

## 2022-04-09 LAB — LIPASE, BLOOD: Lipase: 46 U/L (ref 11–51)

## 2022-04-09 LAB — PHOSPHORUS: Phosphorus: 6.9 mg/dL — ABNORMAL HIGH (ref 2.5–4.6)

## 2022-04-09 MED ORDER — SODIUM CHLORIDE 0.9 % IV SOLN
1.0000 g | Freq: Once | INTRAVENOUS | Status: AC
  Administered 2022-04-09: 1 g via INTRAVENOUS
  Filled 2022-04-09: qty 10

## 2022-04-09 NOTE — ED Provider Notes (Signed)
[facility] EMERGENCY DEPARTMENT Provider Note   CSN: [id number] Arrival date & time: 04/09/22  1109     History {Add pertinent medical, surgical, social history, OB history to HPI:1} Chief Complaint  Patient presents with   Abdominal Pain    [name] is a 81 y.o. male.   Abdominal Pain      Home Medications Prior to Admission medications   Medication Sig Start Date End Date Taking? Authorizing Provider  acetaminophen (TYLENOL) 325 MG tablet Take 1-2 tablets (325-650 mg total) by mouth every 4 (four) hours as needed for mild pain. 08/01/18   Love, Ivan Anchors, PA-C  albuterol (VENTOLIN HFA) 108 (90 Base) MCG/ACT inhaler Inhale 1 puff into the lungs daily as needed for wheezing or shortness of breath.    [provider]  allopurinol (ZYLOPRIM) 100 MG tablet Take 100 mg by mouth daily.    [provider]  Blood Glucose Monitoring Suppl (FREESTYLE FREEDOM LITE) w/Device KIT Use to check blood sugar 2 times per day dx code E11.65 10/21/18   Elayne Snare, MD  camphor-menthol [facility]) lotion Apply 1 application topically 4 (four) times daily as needed for itching.     [provider]  Carboxymethylcellulose Sodium (THERATEARS) 0.25 % SOLN Place 1 drop into both eyes daily as needed (for dryness).    [provider]  cetirizine (ZYRTEC) 10 MG tablet Take 10 mg by mouth daily as needed for allergies.    [provider]  dicyclomine (BENTYL) 10 MG capsule Take 1 capsule (10 mg total) by mouth every 8 (eight) hours as needed for spasms. 12/12/21   Cirigliano, Vito V, DO  donepezil (ARICEPT) 10 MG tablet Take 1 tablet (10 mg total) by mouth at bedtime. 09/26/21   Melvenia Beam, MD  fluticasone (FLONASE) 50 MCG/ACT nasal spray Place 1 spray into both nostrils daily. Patient taking differently: Place 1 spray into both nostrils daily as needed for allergies. 10/08/14   Mikhail, Velta Addison, DO  glucose blood (FREESTYLE LITE) test strip  USE AS DIRECTED THREE TIMES DAILY 02/16/22   Elayne Snare, MD  hydrocortisone 2.5 % cream Apply 1 application topically 3 (three) times daily as needed (skin irritation (legs & arms)).  01/13/19   [provider]  Lancets (FREESTYLE) lancets Use as instructed to check blood sugar 2 times per day dx code E11.65 02/22/16   Elayne Snare, MD  levothyroxine (SYNTHROID, LEVOTHROID) 50 MCG tablet Take 1 tablet (50 mcg total) by mouth daily. Patient taking differently: Take 50 mcg by mouth daily before breakfast. 08/06/14   Baxley, Cresenciano Lick, MD  lidocaine (LIDODERM) 5 % Place 1 patch onto the skin daily as needed (back pain). 11/12/19   [provider]  memantine (NAMENDA) 10 MG tablet Take 1 tablet (10 mg total) by mouth 2 (two) times daily. 09/26/21   Melvenia Beam, MD  midodrine (PROAMATINE) 10 MG tablet Take one pill prior to hemodialysis on MWF Patient taking differently: Take 5 mg by mouth See admin instructions. Take one tablet by mouth prior to hemodialysis on MWF 08/13/18   Love, Ivan Anchors, PA-C  Nutritional Supplements (FEEDING SUPPLEMENT, NEPRO CARB STEADY,) LIQD Take 237 mLs by mouth daily.    [provider]  omeprazole (PRILOSEC) 40 MG capsule Take 1 capsule (40 mg total) by mouth daily. 03/13/18   Esterwood, Amy S, PA-C  repaglinide (PRANDIN) 0.5 MG tablet Take 1 tablet (0.5 mg total) by mouth 2 (two) times daily before a meal.  02/15/22   Elayne Snare, MD  sevelamer carbonate (RENVELA) 800 MG tablet Take 1,600-2,400 mg by mouth See admin instructions. Take 2,400 mg by mouth three times a day with meals and 1,600 mg with snacks per spouse    [provider]  simvastatin (ZOCOR) 20 MG tablet Take 20 mg by mouth at bedtime.    [provider]  Teriparatide, Recombinant, (FORTEO) 600 MCG/2.4ML SOPN INJECT 20 MCG UNDER THE SKIN DAILY (DISCARD 28 DAYS AFTER INITIAL USE) 03/05/22   Elayne Snare, MD  TRADJENTA 5 MG TABS tablet Take 1 tablet (5 mg total) by mouth daily.  11/16/21   Elayne Snare, MD      Allergies    Ambien [zolpidem tartrate] and Penicillins    Review of Systems   Review of Systems  Gastrointestinal:  Positive for abdominal pain.    Physical Exam Updated Vital Signs BP (!) 158/60   Pulse 65   Temp 98.6 F (37 C) (Oral)   Resp 16   SpO2 99%  Physical Exam  ED Results / Procedures / Treatments   Labs (all labs ordered are listed, but only abnormal results are displayed) Labs Reviewed  CBC WITH DIFFERENTIAL/PLATELET - Abnormal; Notable for the following components:      Result Value   RBC 3.49 (*)    Hemoglobin 11.4 (*)    HCT 35.6 (*)    MCV 102.0 (*)    All other components within normal limits  COMPREHENSIVE METABOLIC PANEL - Abnormal; Notable for the following components:   Chloride 92 (*)    Glucose, Bld 174 (*)    BUN 81 (*)    Creatinine, Ser 11.51 (*)    Alkaline Phosphatase 166 (*)    GFR, Estimated 4 (*)    Anion gap 20 (*)    All other components within normal limits  PHOSPHORUS - Abnormal; Notable for the following components:   Phosphorus 6.9 (*)    All other components within normal limits  I-STAT CHEM 8, ED - Abnormal; Notable for the following components:   Chloride 96 (*)    BUN 73 (*)    Creatinine, Ser 12.00 (*)    Glucose, Bld 177 (*)    Calcium, Ion 1.08 (*)    Hemoglobin 12.9 (*)    HCT 38.0 (*)    All other components within normal limits  LIPASE, BLOOD  URINALYSIS, ROUTINE W REFLEX MICROSCOPIC    EKG None  Radiology No results found.  Procedures Procedures  {Document cardiac monitor, telemetry assessment procedure when appropriate:1}  Medications Ordered in ED Medications - No data to display  ED Course/ Medical Decision Making/ A&P                           Medical Decision Making  ***  {Document critical care time when appropriate:1} {Document review of labs and clinical decision tools ie heart score, Chads2Vasc2 etc:1}  {Document your independent review of radiology  images, and any outside records:1} {Document your discussion with family members, caretakers, and with consultants:1} {Document social determinants of health affecting pt's care:1} {Document your decision making why or why not admission, treatments were needed:1} Final Clinical Impression(s) / ED Diagnoses Final diagnoses:  None    Rx / DC Orders ED Discharge Orders     None

## 2022-04-09 NOTE — ED Notes (Signed)
This RN to resume care of pt at this time, no report given from pervious RN. Noted EKG and IV are overdue, vitals overdue as well, will obtained orders at this time

## 2022-04-09 NOTE — ED Provider Triage Note (Signed)
Emergency Medicine Provider Triage Evaluation Note  [name] , a 81 y.o. male  was evaluated in triage.  Pt complains of abdominal pain. Pain has been persistent for about three days in lower abdomen. He has had some worsening confusion as well. Hx dementia. History of frequent UTIs with similar symptoms. Also endorses increased itching. He is a dialysis patient and is scheduled for today. Last went on Friday, no recent missed sessions.   Review of Systems  Positive:  Negative:   Physical Exam  There were no vitals taken for this visit. Gen:   Awake, no distress   Resp:  Normal effort  MSK:   Moves extremities without difficulty  Other:  Abdomen is soft, generalized diffuse tenderness.   Medical Decision Making  Medically screening exam initiated at 11:12 AM.  Appropriate orders placed.  [name] was informed that the remainder of the evaluation will be completed by another provider, this initial triage assessment does not replace that evaluation, and the importance of remaining in the ED until their evaluation is complete.     Adolphus Birchwood, PA-C 04/09/22 1121

## 2022-04-09 NOTE — ED Triage Notes (Signed)
Pt arrives via EMS for abd pain for 3 days. Pt has hx of frequent UTI. [name] states he usually has a little confusion when he develops UTI. He has seemed more confused over the past few days, with hx of dementia. Pt receives dialysis MWF, last treatment was Friday. Due today.  BP 134/70, HR66 94% on room air, CBG 197

## 2022-04-10 DIAGNOSIS — K4090 Unilateral inguinal hernia, without obstruction or gangrene, not specified as recurrent: Secondary | ICD-10-CM | POA: Diagnosis not present

## 2022-04-10 DIAGNOSIS — N3289 Other specified disorders of bladder: Secondary | ICD-10-CM | POA: Diagnosis not present

## 2022-04-10 DIAGNOSIS — R4182 Altered mental status, unspecified: Secondary | ICD-10-CM | POA: Diagnosis not present

## 2022-04-10 MED ORDER — DOXYCYCLINE HYCLATE 100 MG PO CAPS: 100.0000 mg | ORAL_CAPSULE | Freq: Two times a day (BID) | ORAL | 0 refills | Status: DC

## 2022-04-10 NOTE — Discharge Instructions (Addendum)
You were seen in the emergency department for evaluation of discomfort.  Your scans were ultimately reassuring but did show evidence of acute cystitis.  You were treated with 1 dose of IV antibiotics here in the emergency department and discharged with a prescription for doxycycline which does not need to be renally dosed.  Your CT head did show evidence of possible slight increase in the right subdural hematoma for which neurosurgery was consulted and they recommended follow-up in 1 week for recheck.  Nephrology was contacted and they recommend attending dialysis tomorrow and they have a chair reserved for you at 7 AM.

## 2022-04-10 NOTE — ED Notes (Signed)
Pt by window in [place] to watch for wife's arrival near security and wear ED NT can monitor pt in [place]. Pt aware wife is coming to get him

## 2022-04-10 NOTE — ED Notes (Signed)
Pt appears to be sleeping, even RR and unlabored, NAD noted, pt remains in view of staff in hall bed, side rails up x2 for safety, care on going, will continue to monitor.

## 2022-04-10 NOTE — ED Notes (Signed)
Farron Watrous (wife) was called, advised pt is ready to go home, he is being discharge, wife to come transport pt back home. Will be here shortly.

## 2022-04-11 DIAGNOSIS — Z992 Dependence on renal dialysis: Secondary | ICD-10-CM | POA: Diagnosis not present

## 2022-04-11 DIAGNOSIS — N2581 Secondary hyperparathyroidism of renal origin: Secondary | ICD-10-CM | POA: Diagnosis not present

## 2022-04-11 DIAGNOSIS — E1129 Type 2 diabetes mellitus with other diabetic kidney complication: Secondary | ICD-10-CM | POA: Diagnosis not present

## 2022-04-11 DIAGNOSIS — N186 End stage renal disease: Secondary | ICD-10-CM | POA: Diagnosis not present

## 2022-04-11 DIAGNOSIS — E876 Hypokalemia: Secondary | ICD-10-CM | POA: Diagnosis not present

## 2022-04-11 LAB — URINE CULTURE

## 2022-04-12 ENCOUNTER — Encounter: Payer: Self-pay | Admitting: Internal Medicine

## 2022-04-12 ENCOUNTER — Ambulatory Visit (INDEPENDENT_AMBULATORY_CARE_PROVIDER_SITE_OTHER): Payer: Medicare Other | Admitting: Internal Medicine

## 2022-04-12 VITALS — BP 126/68 | HR 68 | Temp 97.9°F

## 2022-04-12 DIAGNOSIS — N2581 Secondary hyperparathyroidism of renal origin: Secondary | ICD-10-CM | POA: Diagnosis not present

## 2022-04-12 DIAGNOSIS — I5022 Chronic systolic (congestive) heart failure: Secondary | ICD-10-CM | POA: Insufficient documentation

## 2022-04-12 DIAGNOSIS — I4891 Unspecified atrial fibrillation: Secondary | ICD-10-CM | POA: Diagnosis not present

## 2022-04-12 DIAGNOSIS — N186 End stage renal disease: Secondary | ICD-10-CM

## 2022-04-12 DIAGNOSIS — F321 Major depressive disorder, single episode, moderate: Secondary | ICD-10-CM | POA: Diagnosis not present

## 2022-04-12 DIAGNOSIS — Z992 Dependence on renal dialysis: Secondary | ICD-10-CM | POA: Diagnosis not present

## 2022-04-12 DIAGNOSIS — L98491 Non-pressure chronic ulcer of skin of other sites limited to breakdown of skin: Secondary | ICD-10-CM | POA: Diagnosis not present

## 2022-04-12 DIAGNOSIS — I480 Paroxysmal atrial fibrillation: Secondary | ICD-10-CM | POA: Insufficient documentation

## 2022-04-12 DIAGNOSIS — S065XAA Traumatic subdural hemorrhage with loss of consciousness status unknown, initial encounter: Secondary | ICD-10-CM

## 2022-04-12 MED ORDER — QUETIAPINE FUMARATE 25 MG PO TABS: 25.0000 mg | ORAL_TABLET | Freq: Every day | ORAL | 1 refills | Status: DC

## 2022-04-12 NOTE — Progress Notes (Signed)
Subjective:    Patient ID: [name], male    DOB: 09/07/1941, 81 y.o.   MRN: [id number]  HPI  81 year old Male seen for follow up of recent Emergency Dept visit.  He presented to the emergency department on July 17 complaining of lower abdominal pain.  He has a history of dementia and end-stage renal disease.  He is on dialysis.  He recently saw Dr. Sima [name] who for follow-up of dementia and his wife was present for that visit.  He had a formal neuropsychological evaluation just over a year ago.  Wife reports that his memory is worse.  Per Dr. Ferne [name] notes, he complains of a constant headache.  Interestingly today he looks great and does not complain of any headache whatsoever.  He has pruritus which I think is consistent with end-stage renal disease.  He is on Aricept and Namenda but they do not seem that these are very helpful.  He did have a CT of the brain July 17 in the emergency department showing chronic bilateral subdural collections.  The left one is stable and the right one is slightly increased compared to prior exam.  He has bur holes in the frontal bones bilaterally from prior treatment for subdural hematoma.  Dr. Sima [name] suggested stopping Aricept for about a month to see if patient's headaches improved.  I think that is okay.  He is being seen by palliative care but they are not addressing these dementia issues according to his wife.  Dr. Sima [name] feels that he most likely has Alzheimer's along with his vascular issues but cannot rule out Lewy body dementia.  He gets agitated sometimes.  Sometimes he does not want to go to dialysis.  Wife would like for him to have some medication on hand for agitation.  I think Nephrology should weigh in on what would be most reasonable to control his agitation.  His B12 injections for documented B12 deficiency years ago apparently have been discontinued at dialysis.  I do not understand the reason for that since it does not seem  to be contraindicated with end-stage renal disease.  His hemoglobin A1c was checked in May and was stable at 6.2%.  They are [place] the beach for [place] days in a few weeks.  When seen in the emergency department on July 17 he had some abdominal tenderness in the left lower quadrant.  No liver abnormality was noted and no pancreatic ductal dilatation was noted.  His spleen was normal.  No evidence of diverticulitis.  No ascites was noted.  He had diffuse bladder wall thickening, bilateral renal atrophy and cyst.  Has 7 mm nodule in the lingular segment of the left upper lobe which was unchanged from prior studies.  He also presented to the Emergency department in February 2023 with left lower quadrant abdominal pain.  He has a history of GI bleed with AV malformations.  He was admitted and underwent small bowel enteroscopy.  Details noted per [name] dated 11/05/2021 is in epic.  This was completely normal study.  Scope was advanced into part of the jejunum.  No bleeding or tumor was noted.                     Review of Systems see above     Objective:   Physical Exam  Pulse 68, BP 126/68 T 97.9 pulse ox 97%  He recognizes me and asked about Dr. Lorrene [name], his former Nephrologist. He  is alert today and talkative. Wife is present.  He is cooperative.  Chest clear.  Cardiac exam: Regular rate and rhythm without ectopy.  Slightly confused with regard to time and day of week.    Assessment & Plan:  End-stage renal disease on dialysis  Chronic subdural hematomas-wife would like to speak with Dr. Ellene [name] about this but I do not think he is a surgical candidate for removal of these.  Dementia-try Seroquel 25 mg at bedtime to see if this helps his agitation.  Has had memory testing by Dr. Sima [name].  He is on Namenda and Aricept.  History of documented B12 deficiency-for some reason wife says these monthly injections have been discontinued at dialysis.  I think  he should receive these as he has documented B12 deficiency.  Type 2 diabetes mellitus with hemoglobin A1c in May 6.2%  Plan: I would like for Nephrologist to [facility] discussed with patient and his wife reasonable expectations going forward.  I would like for them to reassess his medications.  I would like to see if they would agree that he needs B12 injections monthly and how to best get that arranged.  Wife has been reluctant to learn how to do this.  I think we can continue Aricept and Namenda for dementia.  I have added Seroquel 25 mg at bedtime for agitation.  This will be a trial and error and see how he reacts to this.  I would like for Palliative care to be a bit more involved with discussions as to what is reasonable for end-of-life care.  His wife reports fatigue trying to take care of him all the time and that is disturbing.  She works outside the home.  It is plausible he may need skilled nursing in the not too distant future if she is unable to take care of him at home.  They have an adopted daughter but do not think she participates in his care.  I do not think he is a surgical candidate for evacuation of his chronic subdural hematomas and I do not think it would help his dementia at this point.  We can put in referral to [facility].  Dr. Ellene [name] did his previous surgery years ago for subdural hematoma evacuation.  50 minutes spent reviewing chart, interviewing patient and medical decision making.

## 2022-04-12 NOTE — Patient Instructions (Addendum)
I have prescribed Seroquel 25 mg at bedtime for agitation. He needs to restart Vitamin B12 injections monthly. Wife previously has been shown how to do this at home but may not be comfortable doing this. She says it was stopped at dialysis. Referral to Nephrologist at [facility] to discuss reasonable expectations with patient and his wife. I personally do not think at age 81 he should have further Neurosurgery for chronic subdurals. He has dementia.

## 2022-04-13 ENCOUNTER — Other Ambulatory Visit: Payer: Self-pay

## 2022-04-13 DIAGNOSIS — N2581 Secondary hyperparathyroidism of renal origin: Secondary | ICD-10-CM | POA: Diagnosis not present

## 2022-04-13 DIAGNOSIS — E876 Hypokalemia: Secondary | ICD-10-CM | POA: Diagnosis not present

## 2022-04-13 DIAGNOSIS — E538 Deficiency of other specified B group vitamins: Secondary | ICD-10-CM

## 2022-04-13 DIAGNOSIS — Z992 Dependence on renal dialysis: Secondary | ICD-10-CM | POA: Diagnosis not present

## 2022-04-13 DIAGNOSIS — N186 End stage renal disease: Secondary | ICD-10-CM | POA: Diagnosis not present

## 2022-04-13 DIAGNOSIS — E1129 Type 2 diabetes mellitus with other diabetic kidney complication: Secondary | ICD-10-CM | POA: Diagnosis not present

## 2022-04-13 MED ORDER — "SYRINGE 25G X 1"" 3 ML MISC": 1.0000 | 0 refills | Status: DC

## 2022-04-13 MED ORDER — CYANOCOBALAMIN 1000 MCG/ML IJ SOLN: 1000.0000 ug | INTRAMUSCULAR | 0 refills | Status: AC

## 2022-04-16 ENCOUNTER — Telehealth: Payer: Self-pay

## 2022-04-16 DIAGNOSIS — N2581 Secondary hyperparathyroidism of renal origin: Secondary | ICD-10-CM | POA: Diagnosis not present

## 2022-04-16 DIAGNOSIS — N186 End stage renal disease: Secondary | ICD-10-CM | POA: Diagnosis not present

## 2022-04-16 DIAGNOSIS — E876 Hypokalemia: Secondary | ICD-10-CM | POA: Diagnosis not present

## 2022-04-16 DIAGNOSIS — E1129 Type 2 diabetes mellitus with other diabetic kidney complication: Secondary | ICD-10-CM | POA: Diagnosis not present

## 2022-04-16 DIAGNOSIS — Z992 Dependence on renal dialysis: Secondary | ICD-10-CM | POA: Diagnosis not present

## 2022-04-16 NOTE — Telephone Encounter (Signed)
1210 pm.  Message received from wife [name].  Return call made.  No answer.  Message left requesting a call back.

## 2022-04-17 ENCOUNTER — Telehealth: Payer: Self-pay

## 2022-04-17 NOTE — Telephone Encounter (Signed)
255 pm.  Return call made to patient's wife [name].  No answer.  Message left requesting a call back.

## 2022-04-17 NOTE — Telephone Encounter (Signed)
Message received from wife [name].  Return call made at 420 pm.  No answer.  Message left.

## 2022-04-18 DIAGNOSIS — Z992 Dependence on renal dialysis: Secondary | ICD-10-CM | POA: Diagnosis not present

## 2022-04-18 DIAGNOSIS — E876 Hypokalemia: Secondary | ICD-10-CM | POA: Diagnosis not present

## 2022-04-18 DIAGNOSIS — N186 End stage renal disease: Secondary | ICD-10-CM | POA: Diagnosis not present

## 2022-04-18 DIAGNOSIS — E1129 Type 2 diabetes mellitus with other diabetic kidney complication: Secondary | ICD-10-CM | POA: Diagnosis not present

## 2022-04-18 DIAGNOSIS — N2581 Secondary hyperparathyroidism of renal origin: Secondary | ICD-10-CM | POA: Diagnosis not present

## 2022-04-20 DIAGNOSIS — N186 End stage renal disease: Secondary | ICD-10-CM | POA: Diagnosis not present

## 2022-04-20 DIAGNOSIS — E876 Hypokalemia: Secondary | ICD-10-CM | POA: Diagnosis not present

## 2022-04-20 DIAGNOSIS — E1129 Type 2 diabetes mellitus with other diabetic kidney complication: Secondary | ICD-10-CM | POA: Diagnosis not present

## 2022-04-20 DIAGNOSIS — N2581 Secondary hyperparathyroidism of renal origin: Secondary | ICD-10-CM | POA: Diagnosis not present

## 2022-04-20 DIAGNOSIS — Z992 Dependence on renal dialysis: Secondary | ICD-10-CM | POA: Diagnosis not present

## 2022-04-23 DIAGNOSIS — N186 End stage renal disease: Secondary | ICD-10-CM | POA: Diagnosis not present

## 2022-04-23 DIAGNOSIS — N2581 Secondary hyperparathyroidism of renal origin: Secondary | ICD-10-CM | POA: Diagnosis not present

## 2022-04-23 DIAGNOSIS — Z992 Dependence on renal dialysis: Secondary | ICD-10-CM | POA: Diagnosis not present

## 2022-04-23 DIAGNOSIS — E1129 Type 2 diabetes mellitus with other diabetic kidney complication: Secondary | ICD-10-CM | POA: Diagnosis not present

## 2022-04-23 DIAGNOSIS — E876 Hypokalemia: Secondary | ICD-10-CM | POA: Diagnosis not present

## 2022-04-24 DIAGNOSIS — N186 End stage renal disease: Secondary | ICD-10-CM | POA: Diagnosis not present

## 2022-04-24 DIAGNOSIS — Z992 Dependence on renal dialysis: Secondary | ICD-10-CM | POA: Diagnosis not present

## 2022-04-24 DIAGNOSIS — E1122 Type 2 diabetes mellitus with diabetic chronic kidney disease: Secondary | ICD-10-CM | POA: Diagnosis not present

## 2022-04-25 ENCOUNTER — Ambulatory Visit: Payer: Self-pay

## 2022-04-25 DIAGNOSIS — Z992 Dependence on renal dialysis: Secondary | ICD-10-CM | POA: Diagnosis not present

## 2022-04-25 DIAGNOSIS — D631 Anemia in chronic kidney disease: Secondary | ICD-10-CM | POA: Diagnosis not present

## 2022-04-25 DIAGNOSIS — D509 Iron deficiency anemia, unspecified: Secondary | ICD-10-CM | POA: Diagnosis not present

## 2022-04-25 DIAGNOSIS — E876 Hypokalemia: Secondary | ICD-10-CM | POA: Diagnosis not present

## 2022-04-25 DIAGNOSIS — E1129 Type 2 diabetes mellitus with other diabetic kidney complication: Secondary | ICD-10-CM | POA: Diagnosis not present

## 2022-04-25 DIAGNOSIS — N186 End stage renal disease: Secondary | ICD-10-CM | POA: Diagnosis not present

## 2022-04-25 DIAGNOSIS — N2581 Secondary hyperparathyroidism of renal origin: Secondary | ICD-10-CM | POA: Diagnosis not present

## 2022-04-25 NOTE — Patient Outreach (Signed)
  Care Coordination   Initial Visit Note   04/25/2022 Name: ZAKARIYYA HELFMAN MRN: [id number] DOB: 04-03-1941  RAKAN SOFFER is a 81 y.o. year old male who sees Baxley, Cresenciano Lick, MD for primary care. I  spoke with patients spouse and primary care giver Latoya Diskin by phone today.  What matters to the patients health and wellness today?  Doing well with new medication adjustment to address evening agitation    Goals Addressed             This Visit's Progress    COMPLETED: Care Coordination Activities - no follow up required       Care Coordination Interventions: SDoH screening completed - no acute challenges identified Fall risk assessment completed - no falls within past year. Low fall risk Reviewed upcomming appointments with Mrs. Royce Macadamia to contact patients primary care provider as needed        SDOH assessments and interventions completed:  Yes  SDOH Interventions Today    Flowsheet Row Most Recent Value  SDOH Interventions   Food Insecurity Interventions Intervention Not Indicated  Housing Interventions Intervention Not Indicated  Transportation Interventions Intervention Not Indicated        Care Coordination Interventions Activated:  Yes  Care Coordination Interventions:  Yes, provided   Follow up plan: No further intervention required.   Encounter Outcome:  Pt. Visit Completed   Daneen Schick, BSW, CDP Social Worker, Certified Dementia Practitioner Care Coordination [phone]

## 2022-04-25 NOTE — Patient Instructions (Signed)
Visit Information  Thank you for taking time to visit with me today. Please don't hesitate to contact me if I can be of assistance to you.   Following are the goals we discussed today:   Goals Addressed             This Visit's Progress    COMPLETED: Care Coordination Activities - no follow up required       Care Coordination Interventions: SDoH screening completed - no acute challenges identified Fall risk assessment completed - no falls within past year. Low fall risk Reviewed upcomming appointments with Mrs. Kashif Pooler to contact patients primary care provider as needed        Please call the care guide team at [phone] if you need to schedule an appointment with me.  If you are experiencing a Mental Health or [place] or need someone to talk to, please call 1-800-273-TALK (toll free, 24 hour hotline)  Patient verbalizes understanding of instructions and care plan provided today and agrees to view in [place]. Active MyChart status and patient understanding of how to access instructions and care plan via MyChart confirmed with patient.     No further follow up required: Please contact me as needed.  Daneen Schick, BSW, CDP Social Worker, Certified Dementia Practitioner Care Coordination [phone]

## 2022-04-26 ENCOUNTER — Encounter: Payer: Self-pay | Admitting: Nurse Practitioner

## 2022-04-26 ENCOUNTER — Other Ambulatory Visit: Payer: Medicare Other | Admitting: Nurse Practitioner

## 2022-04-26 DIAGNOSIS — B20 Human immunodeficiency virus [HIV] disease: Secondary | ICD-10-CM | POA: Diagnosis not present

## 2022-04-26 DIAGNOSIS — Z515 Encounter for palliative care: Secondary | ICD-10-CM

## 2022-04-26 DIAGNOSIS — R630 Anorexia: Secondary | ICD-10-CM | POA: Diagnosis not present

## 2022-04-26 DIAGNOSIS — F02818 Dementia in other diseases classified elsewhere, unspecified severity, with other behavioral disturbance: Secondary | ICD-10-CM | POA: Diagnosis not present

## 2022-04-26 NOTE — Progress Notes (Signed)
[facility] Consult Note Telephone: [phone]  Fax: [phone]    Date of encounter: 04/26/22 12:38 PM PATIENT NAME: [name] [address] [place] [address]   [phone] (home)  DOB: [birth year] MRN: [id number] PRIMARY CARE PROVIDER:    Elby Showers, MD,  [address] [place] [address] [phone] RESPONSIBLE PARTY:    Contact Information     Name Relation Home Work Mobile   Slee,Shirley Spouse [phone]  [phone]   Shedlock,Crystal Daughter [phone]  [phone]   Summers,Tammy Niece [phone]  [phone]      I met face to face with patient and family in home. Palliative Care was asked to follow this patient by consultation request of  [name], Johnny Lick, MD to address advance care planning and complex medical decision making. This is a follow up visit.                                  ASSESSMENT AND PLAN / RECOMMENDATIONS: 1. ACP; DNR; MOST form in Vynca; Continue to treat what is treatable.    2. Anorexia; secondary to dementia; We talked about nutrition. We talked about supplements. We talked about disease progression of dementia in the setting of ESRD. We talked about soda's with fluid restriction.    3. Palliative care encounter; Palliative care encounter; Palliative medicine team will continue to support patient, patient's family, and medical team. Visit consisted of counseling and education dealing with the complex and emotionally intense issues of symptom management and palliative care in the setting of serious and potentially life-threatening illness Follow up Palliative Care Visit: Palliative care will continue to follow for complex medical decision making, advance care planning, and clarification of goals. Return 8 weeks or prn.   I spent 63 minutes providing this consultation. More than 50% of the time in this consultation was spent in counseling and care  coordination. PPS: 50% Chief Complaint: Follow up palliative consult for complex medical decision making   HISTORY OF PRESENT ILLNESS:  [name] is a 81 y.o. year old male  with  multiple medical problems including ESRD requiring HD, Alzheimer's disease, anemia, CAD, PVD, DM, HTN, HLD, sleep apnea, hypothyroidism, gerd, gout, arthritis, h/o hiatal hernia, h/o diverticulitis, ED, seasonal allergies, cataract, h/o burr holes, AV fistula placement, breast sgy left - granulomatous mastitis, aortobifemoral bypass, angioplasty. I called [name] to confirm [facility] f/u visit and covid screening negative. I visited Johnny and [name]. I visited Johnny and Johnny Barg in their home. I talked with [name] separately, updated on recent events with ED visit with UTI on abx, CT brain done with showing chronic subdural collections over the frontal lobes bilaterally with slight enlargement of the collection on right with increased attenuation possible subacute on chronic subdural hematoma. CT abdomen showed diffuse bladder wall thickening with perivascular fat stranding compatible with cystitis. He was d/c home. We talked about visit with [name], restarting B12 injections. We talked about HD at length, medical goals with upcoming visits scheduled with Nephrology and Neurology with further questions to ask. We talked about prognosis, what would happen if HD is stopped. [name] endorses her wishes are for [name] not to pass in their home but wishes are for the hospice home. We talked about that process. We talked about her conversations with [name] and their daughter, [name]. We talked about currently though [name]  becomes frustrated with HD, he continues to go and repeats to [name] he wants to life for his family. We visited [name], he was lying in his recliner. We talked about [name]. We talked about his chronic back pain, arthritis, alternative relief methods. We talked about appetite, sleep. We talked about abdominal pains  with GI following. We talked about medical goals, [name] endorses he wishes to live. We talked about HD at length, [name] expressed frustration with people at HD at times and he gets tired of being on HD but overall he wants to continue HD. When "God takes me I am ready to go, my wife and daughter will be taken care of". We talked and explored his feeling of being on HD, chronic disease, topic of death/dying with previous family members, friends. We talked about suffering, quality of life. We talked about role pc in poc. We talked about f/u visit following Neurology visit, scheduled. Therapeutic listening, emotional support provided. Questions answered. We talked about their upcoming trip to [facility] in September.  History obtained from review of EMR, discussion with [name].  I reviewed available labs, medications, imaging, studies and related documents from the EMR.  Records reviewed and summarized above.    [name] 10 point system reviewed with Johnny and Johnny Zulauf all negative except HPI   Physical Exam: Constitutional: NAD General: frail appearing, thin, pleasant male EYES: lids intact ENMT: oral mucous membranes moist CV: S1S2, RRR Pulmonary: LCTA, no increased work of breathing, no cough, room air Abdomen: normo-active BS + 4 quadrants, soft and non tender MSK: ambulatory Skin: warm and dry Neuro:  + generalized weakness,  + cognitive impairment Psych: non-anxious affect, A and O x 2 Thank you for the opportunity to participate in the care of Johnny. [name].  The palliative care team will continue to follow. Please call our office at [phone] if we can be of additional assistance.   [name] Claudie Brickhouse, NP   COVID-19 PATIENT SCREENING TOOL Asked and negative response unless otherwise noted:   Have you had symptoms of covid, tested positive or been in contact with someone with symptoms/positive test in the past 5-10 days?  NO

## 2022-04-27 DIAGNOSIS — D631 Anemia in chronic kidney disease: Secondary | ICD-10-CM | POA: Diagnosis not present

## 2022-04-27 DIAGNOSIS — N186 End stage renal disease: Secondary | ICD-10-CM | POA: Diagnosis not present

## 2022-04-27 DIAGNOSIS — N2581 Secondary hyperparathyroidism of renal origin: Secondary | ICD-10-CM | POA: Diagnosis not present

## 2022-04-27 DIAGNOSIS — Z992 Dependence on renal dialysis: Secondary | ICD-10-CM | POA: Diagnosis not present

## 2022-04-27 DIAGNOSIS — E876 Hypokalemia: Secondary | ICD-10-CM | POA: Diagnosis not present

## 2022-04-30 DIAGNOSIS — N2581 Secondary hyperparathyroidism of renal origin: Secondary | ICD-10-CM | POA: Diagnosis not present

## 2022-04-30 DIAGNOSIS — E876 Hypokalemia: Secondary | ICD-10-CM | POA: Diagnosis not present

## 2022-04-30 DIAGNOSIS — D631 Anemia in chronic kidney disease: Secondary | ICD-10-CM | POA: Diagnosis not present

## 2022-04-30 DIAGNOSIS — Z992 Dependence on renal dialysis: Secondary | ICD-10-CM | POA: Diagnosis not present

## 2022-04-30 DIAGNOSIS — N186 End stage renal disease: Secondary | ICD-10-CM | POA: Diagnosis not present

## 2022-05-02 DIAGNOSIS — D631 Anemia in chronic kidney disease: Secondary | ICD-10-CM | POA: Diagnosis not present

## 2022-05-02 DIAGNOSIS — E876 Hypokalemia: Secondary | ICD-10-CM | POA: Diagnosis not present

## 2022-05-02 DIAGNOSIS — N186 End stage renal disease: Secondary | ICD-10-CM | POA: Diagnosis not present

## 2022-05-02 DIAGNOSIS — Z992 Dependence on renal dialysis: Secondary | ICD-10-CM | POA: Diagnosis not present

## 2022-05-02 DIAGNOSIS — N2581 Secondary hyperparathyroidism of renal origin: Secondary | ICD-10-CM | POA: Diagnosis not present

## 2022-05-03 DIAGNOSIS — S065X9A Traumatic subdural hemorrhage with loss of consciousness of unspecified duration, initial encounter: Secondary | ICD-10-CM | POA: Diagnosis not present

## 2022-05-04 ENCOUNTER — Other Ambulatory Visit: Payer: Self-pay | Admitting: Neurological Surgery

## 2022-05-04 DIAGNOSIS — S065XAA Traumatic subdural hemorrhage with loss of consciousness status unknown, initial encounter: Secondary | ICD-10-CM

## 2022-05-04 DIAGNOSIS — D631 Anemia in chronic kidney disease: Secondary | ICD-10-CM | POA: Diagnosis not present

## 2022-05-04 DIAGNOSIS — N2581 Secondary hyperparathyroidism of renal origin: Secondary | ICD-10-CM | POA: Diagnosis not present

## 2022-05-04 DIAGNOSIS — Z992 Dependence on renal dialysis: Secondary | ICD-10-CM | POA: Diagnosis not present

## 2022-05-04 DIAGNOSIS — N186 End stage renal disease: Secondary | ICD-10-CM | POA: Diagnosis not present

## 2022-05-04 DIAGNOSIS — E876 Hypokalemia: Secondary | ICD-10-CM | POA: Diagnosis not present

## 2022-05-07 DIAGNOSIS — D631 Anemia in chronic kidney disease: Secondary | ICD-10-CM | POA: Diagnosis not present

## 2022-05-07 DIAGNOSIS — N186 End stage renal disease: Secondary | ICD-10-CM | POA: Diagnosis not present

## 2022-05-07 DIAGNOSIS — E876 Hypokalemia: Secondary | ICD-10-CM | POA: Diagnosis not present

## 2022-05-07 DIAGNOSIS — N2581 Secondary hyperparathyroidism of renal origin: Secondary | ICD-10-CM | POA: Diagnosis not present

## 2022-05-07 DIAGNOSIS — Z992 Dependence on renal dialysis: Secondary | ICD-10-CM | POA: Diagnosis not present

## 2022-05-08 ENCOUNTER — Other Ambulatory Visit: Payer: Medicare Other | Admitting: Nurse Practitioner

## 2022-05-08 ENCOUNTER — Encounter: Payer: Self-pay | Admitting: Nurse Practitioner

## 2022-05-08 DIAGNOSIS — Z515 Encounter for palliative care: Secondary | ICD-10-CM

## 2022-05-08 DIAGNOSIS — R630 Anorexia: Secondary | ICD-10-CM | POA: Diagnosis not present

## 2022-05-08 NOTE — Progress Notes (Signed)
[facility] Consult Note Telephone: [phone]  Fax: [phone]    Date of encounter: 05/08/22 3:09 PM PATIENT NAME: [name] [address] [place] [address]   [phone] (home)  DOB: [birth year] MRN: [id number] PRIMARY CARE PROVIDER:    Elby Showers, MD,  [address] [place] [address] [phone]  RESPONSIBLE PARTY:    Contact Information     Name Relation Home Work Mobile   [place],Shirley Spouse [phone]  [phone]   Unrein,Crystal Daughter [phone]  [phone]   Iantha Fallen Niece [phone]  [phone]      I connected with Mrs Yuan with  [name] on 05/08/22 by telephone as video not available enabled telemedicine application and verified that I am speaking with the correct person. ASSESSMENT AND PLAN / RECOMMENDATIONS: 1. ACP; DNR; MOST form in Vynca; Continue to treat what is treatable.    2. Anorexia; secondary to dementia; continue to monitor, weight's, We talked about new medication for seizures, Dr Clarice Pole note was not available, will request;    3. Palliative care encounter; Palliative care encounter; Palliative medicine team will continue to support patient, patient's family, and medical team. Visit consisted of counseling and education dealing with the complex and emotionally intense issues of symptom management and palliative care in the setting of serious and potentially life-threatening illness Follow up Palliative Care Visit: Palliative care will continue to follow for complex medical decision making, advance care planning, and clarification of goals. Return 4 weeks following Neurosurgery visit and re-scan   I spent 31 minutes providing this consultation. More than 50% of the time in this consultation was spent in counseling and care coordination. PPS: 50% Chief Complaint: Follow up palliative consult for complex medical decision making   HISTORY OF  PRESENT ILLNESS:  [name] is a 81 y.o. year old male  with  multiple medical problems including ESRD requiring HD, Alzheimer's disease, anemia, CAD, PVD, DM, HTN, HLD, sleep apnea, hypothyroidism, gerd, gout, arthritis, h/o hiatal hernia, h/o diverticulitis, ED, seasonal allergies, cataract, h/o burr holes, AV fistula placement, breast sgy left - granulomatous mastitis, aortobifemoral bypass, angioplasty. I called Mrs Bergdoll for f/u pc telephonic as video not available telemedicine f/u visit. Mrs Puls in agreement. We talked about ros, update from appointment with Dr Ellene Route, Neurosurgeon with initiating medication for seizure prevention with plan to re-scan in about 3 weeks to see progression of fluid. Mrs Tootle endorses no other changes, problems or concerns. Medical goals reviewed, will continue supportive role. Will schedule f/u visit following next scan and appointment with Dr Ellene Route to see next steps, supportive role with PC, symptom management.  Therapeutic listening, emotional support provided. Questions answered. We talked about their upcoming trip to [facility] in September.   History obtained from review of EMR, discussion with Mrs and Mr. [name].  I reviewed available labs, medications, imaging, studies and related documents from the EMR.  Records reviewed and summarized above.    ROS 10 point system reviewed with Mr and Mrs Belsito all negative except HPI   Physical Exam: Palliative Care was asked to follow this patient by consultation request of  Baxley, Cresenciano Lick, MD to address advance care planning and complex medical decision making. This is a follow up visit.  Thank you for the opportunity to participate in the care of Mr. Thier.  The palliative care team will continue to follow. Please call our office at [phone] if we can be of additional assistance.  Giordana Weinheimer Ihor Gully, NP

## 2022-05-09 DIAGNOSIS — E876 Hypokalemia: Secondary | ICD-10-CM | POA: Diagnosis not present

## 2022-05-09 DIAGNOSIS — N2581 Secondary hyperparathyroidism of renal origin: Secondary | ICD-10-CM | POA: Diagnosis not present

## 2022-05-09 DIAGNOSIS — D631 Anemia in chronic kidney disease: Secondary | ICD-10-CM | POA: Diagnosis not present

## 2022-05-09 DIAGNOSIS — N186 End stage renal disease: Secondary | ICD-10-CM | POA: Diagnosis not present

## 2022-05-09 DIAGNOSIS — Z992 Dependence on renal dialysis: Secondary | ICD-10-CM | POA: Diagnosis not present

## 2022-05-11 DIAGNOSIS — Z992 Dependence on renal dialysis: Secondary | ICD-10-CM | POA: Diagnosis not present

## 2022-05-11 DIAGNOSIS — N2581 Secondary hyperparathyroidism of renal origin: Secondary | ICD-10-CM | POA: Diagnosis not present

## 2022-05-11 DIAGNOSIS — E876 Hypokalemia: Secondary | ICD-10-CM | POA: Diagnosis not present

## 2022-05-11 DIAGNOSIS — D631 Anemia in chronic kidney disease: Secondary | ICD-10-CM | POA: Diagnosis not present

## 2022-05-11 DIAGNOSIS — N186 End stage renal disease: Secondary | ICD-10-CM | POA: Diagnosis not present

## 2022-05-12 ENCOUNTER — Other Ambulatory Visit: Payer: Self-pay | Admitting: Endocrinology

## 2022-05-14 DIAGNOSIS — D631 Anemia in chronic kidney disease: Secondary | ICD-10-CM | POA: Diagnosis not present

## 2022-05-14 DIAGNOSIS — N2581 Secondary hyperparathyroidism of renal origin: Secondary | ICD-10-CM | POA: Diagnosis not present

## 2022-05-14 DIAGNOSIS — N186 End stage renal disease: Secondary | ICD-10-CM | POA: Diagnosis not present

## 2022-05-14 DIAGNOSIS — Z992 Dependence on renal dialysis: Secondary | ICD-10-CM | POA: Diagnosis not present

## 2022-05-14 DIAGNOSIS — E876 Hypokalemia: Secondary | ICD-10-CM | POA: Diagnosis not present

## 2022-05-16 DIAGNOSIS — D631 Anemia in chronic kidney disease: Secondary | ICD-10-CM | POA: Diagnosis not present

## 2022-05-16 DIAGNOSIS — E876 Hypokalemia: Secondary | ICD-10-CM | POA: Diagnosis not present

## 2022-05-16 DIAGNOSIS — N186 End stage renal disease: Secondary | ICD-10-CM | POA: Diagnosis not present

## 2022-05-16 DIAGNOSIS — N2581 Secondary hyperparathyroidism of renal origin: Secondary | ICD-10-CM | POA: Diagnosis not present

## 2022-05-16 DIAGNOSIS — Z992 Dependence on renal dialysis: Secondary | ICD-10-CM | POA: Diagnosis not present

## 2022-05-17 ENCOUNTER — Other Ambulatory Visit: Payer: Medicare Other

## 2022-05-17 DIAGNOSIS — N429 Disorder of prostate, unspecified: Secondary | ICD-10-CM | POA: Diagnosis not present

## 2022-05-17 DIAGNOSIS — E782 Mixed hyperlipidemia: Secondary | ICD-10-CM | POA: Diagnosis not present

## 2022-05-17 DIAGNOSIS — E118 Type 2 diabetes mellitus with unspecified complications: Secondary | ICD-10-CM

## 2022-05-17 DIAGNOSIS — N529 Male erectile dysfunction, unspecified: Secondary | ICD-10-CM

## 2022-05-17 DIAGNOSIS — E1169 Type 2 diabetes mellitus with other specified complication: Secondary | ICD-10-CM

## 2022-05-17 DIAGNOSIS — Z125 Encounter for screening for malignant neoplasm of prostate: Secondary | ICD-10-CM | POA: Diagnosis not present

## 2022-05-17 DIAGNOSIS — I4891 Unspecified atrial fibrillation: Secondary | ICD-10-CM | POA: Diagnosis not present

## 2022-05-17 DIAGNOSIS — N186 End stage renal disease: Secondary | ICD-10-CM

## 2022-05-17 DIAGNOSIS — N2581 Secondary hyperparathyroidism of renal origin: Secondary | ICD-10-CM | POA: Diagnosis not present

## 2022-05-17 DIAGNOSIS — I10 Essential (primary) hypertension: Secondary | ICD-10-CM

## 2022-05-17 DIAGNOSIS — Z992 Dependence on renal dialysis: Secondary | ICD-10-CM | POA: Diagnosis not present

## 2022-05-18 ENCOUNTER — Telehealth: Payer: Self-pay | Admitting: Internal Medicine

## 2022-05-18 DIAGNOSIS — E876 Hypokalemia: Secondary | ICD-10-CM | POA: Diagnosis not present

## 2022-05-18 DIAGNOSIS — Z992 Dependence on renal dialysis: Secondary | ICD-10-CM | POA: Diagnosis not present

## 2022-05-18 DIAGNOSIS — N186 End stage renal disease: Secondary | ICD-10-CM | POA: Diagnosis not present

## 2022-05-18 DIAGNOSIS — D631 Anemia in chronic kidney disease: Secondary | ICD-10-CM | POA: Diagnosis not present

## 2022-05-18 DIAGNOSIS — N2581 Secondary hyperparathyroidism of renal origin: Secondary | ICD-10-CM | POA: Diagnosis not present

## 2022-05-18 LAB — COMPLETE METABOLIC PANEL WITH GFR
AG Ratio: 1.4 (calc) (ref 1.0–2.5)
ALT: 10 U/L (ref 9–46)
AST: 18 U/L (ref 10–35)
Albumin: 3.9 g/dL (ref 3.6–5.1)
Alkaline phosphatase (APISO): 145 U/L — ABNORMAL HIGH (ref 35–144)
BUN/Creatinine Ratio: 6 (calc) (ref 6–22)
BUN: 37 mg/dL — ABNORMAL HIGH (ref 7–25)
CO2: 29 mmol/L (ref 20–32)
Calcium: 9.7 mg/dL (ref 8.6–10.3)
Chloride: 94 mmol/L — ABNORMAL LOW (ref 98–110)
Creat: 6.68 mg/dL — ABNORMAL HIGH (ref 0.70–1.22)
Globulin: 2.8 g/dL (calc) (ref 1.9–3.7)
Glucose, Bld: 119 mg/dL — ABNORMAL HIGH (ref 65–99)
Potassium: 4.5 mmol/L (ref 3.5–5.3)
Sodium: 143 mmol/L (ref 135–146)
Total Bilirubin: 0.3 mg/dL (ref 0.2–1.2)
Total Protein: 6.7 g/dL (ref 6.1–8.1)
eGFR: 8 mL/min/{1.73_m2} — ABNORMAL LOW (ref >=60)

## 2022-05-18 LAB — CBC WITH DIFFERENTIAL/PLATELET
Absolute Monocytes: 630 cells/uL (ref 200–950)
Basophils Absolute: 59 cells/uL (ref 0–200)
Basophils Relative: 0.7 %
Eosinophils Absolute: 260 cells/uL (ref 15–500)
Eosinophils Relative: 3.1 %
HCT: 31.9 % — ABNORMAL LOW (ref 38.5–50.0)
Hemoglobin: 10.5 g/dL — ABNORMAL LOW (ref 13.2–17.1)
Lymphs Abs: 2117 cells/uL (ref 850–3900)
MCH: 32.1 pg (ref 27.0–33.0)
MCHC: 32.9 g/dL (ref 32.0–36.0)
MCV: 97.6 fL (ref 80.0–100.0)
MPV: 10.6 fL (ref 7.5–12.5)
Monocytes Relative: 7.5 %
Neutro Abs: 5334 cells/uL (ref 1500–7800)
Neutrophils Relative %: 63.5 %
Platelets: 187 10*3/uL (ref 140–400)
RBC: 3.27 10*6/uL — ABNORMAL LOW (ref 4.20–5.80)
RDW: 14.9 % (ref 11.0–15.0)
Total Lymphocyte: 25.2 %
WBC: 8.4 10*3/uL (ref 3.8–10.8)

## 2022-05-18 LAB — HEMOGLOBIN A1C
Hgb A1c MFr Bld: 6.5 % of total Hgb — ABNORMAL HIGH (ref <5.7)
Mean Plasma Glucose: 140 mg/dL
eAG (mmol/L): 7.7 mmol/L

## 2022-05-18 LAB — LIPID PANEL
Cholesterol: 96 mg/dL (ref <200)
HDL: 44 mg/dL (ref >=40)
LDL Cholesterol (Calc): 30 mg/dL (calc)
Non-HDL Cholesterol (Calc): 52 mg/dL (calc) (ref <130)
Total CHOL/HDL Ratio: 2.2 (calc) (ref <5.0)
Triglycerides: 132 mg/dL (ref <150)

## 2022-05-18 LAB — PSA: PSA: 0.06 ng/mL (ref <=4.00)

## 2022-05-18 NOTE — Telephone Encounter (Signed)
[place] [address]  Phone [phone] Fax [phone]  Faxed labs, office note and demographics asking for Vit B 12 to be administered once a month.  Tobie Poet AGNP-BC Ernest Haber PA-C

## 2022-05-21 DIAGNOSIS — E876 Hypokalemia: Secondary | ICD-10-CM | POA: Diagnosis not present

## 2022-05-21 DIAGNOSIS — N2581 Secondary hyperparathyroidism of renal origin: Secondary | ICD-10-CM | POA: Diagnosis not present

## 2022-05-21 DIAGNOSIS — D631 Anemia in chronic kidney disease: Secondary | ICD-10-CM | POA: Diagnosis not present

## 2022-05-21 DIAGNOSIS — N186 End stage renal disease: Secondary | ICD-10-CM | POA: Diagnosis not present

## 2022-05-21 DIAGNOSIS — Z992 Dependence on renal dialysis: Secondary | ICD-10-CM | POA: Diagnosis not present

## 2022-05-22 ENCOUNTER — Encounter: Payer: Self-pay | Admitting: Internal Medicine

## 2022-05-22 ENCOUNTER — Ambulatory Visit (INDEPENDENT_AMBULATORY_CARE_PROVIDER_SITE_OTHER): Payer: Medicare Other | Admitting: Internal Medicine

## 2022-05-22 VITALS — BP 110/42 | HR 93 | Temp 99.0°F | Ht 67.0 in | Wt 119.0 lb

## 2022-05-22 DIAGNOSIS — Z8679 Personal history of other diseases of the circulatory system: Secondary | ICD-10-CM

## 2022-05-22 DIAGNOSIS — E039 Hypothyroidism, unspecified: Secondary | ICD-10-CM

## 2022-05-22 DIAGNOSIS — E119 Type 2 diabetes mellitus without complications: Secondary | ICD-10-CM | POA: Diagnosis not present

## 2022-05-22 DIAGNOSIS — Z0000 Encounter for general adult medical examination without abnormal findings: Secondary | ICD-10-CM

## 2022-05-22 DIAGNOSIS — Z23 Encounter for immunization: Secondary | ICD-10-CM

## 2022-05-22 DIAGNOSIS — Z87820 Personal history of traumatic brain injury: Secondary | ICD-10-CM | POA: Diagnosis not present

## 2022-05-22 DIAGNOSIS — I10 Essential (primary) hypertension: Secondary | ICD-10-CM

## 2022-05-22 DIAGNOSIS — R54 Age-related physical debility: Secondary | ICD-10-CM | POA: Diagnosis not present

## 2022-05-22 DIAGNOSIS — E538 Deficiency of other specified B group vitamins: Secondary | ICD-10-CM | POA: Diagnosis not present

## 2022-05-22 DIAGNOSIS — Z7185 Encounter for immunization safety counseling: Secondary | ICD-10-CM | POA: Diagnosis not present

## 2022-05-22 DIAGNOSIS — Z8739 Personal history of other diseases of the musculoskeletal system and connective tissue: Secondary | ICD-10-CM

## 2022-05-22 DIAGNOSIS — N186 End stage renal disease: Secondary | ICD-10-CM

## 2022-05-22 DIAGNOSIS — M810 Age-related osteoporosis without current pathological fracture: Secondary | ICD-10-CM

## 2022-05-22 DIAGNOSIS — Z992 Dependence on renal dialysis: Secondary | ICD-10-CM

## 2022-05-22 DIAGNOSIS — R413 Other amnesia: Secondary | ICD-10-CM | POA: Diagnosis not present

## 2022-05-22 DIAGNOSIS — F0280 Dementia in other diseases classified elsewhere without behavioral disturbance: Secondary | ICD-10-CM | POA: Diagnosis not present

## 2022-05-22 DIAGNOSIS — N2581 Secondary hyperparathyroidism of renal origin: Secondary | ICD-10-CM | POA: Diagnosis not present

## 2022-05-22 MED ORDER — QUETIAPINE FUMARATE 25 MG PO TABS: 25.0000 mg | ORAL_TABLET | Freq: Every day | ORAL | 0 refills | Status: DC

## 2022-05-22 NOTE — Progress Notes (Signed)
Annual Wellness Visit     Patient: [name], Male    DOB: 1941/01/25, 81 y.o.   MRN: [id number] Visit Date: 05/22/2022  Chief Complaint  Patient presents with   Medicare Wellness   Subjective    [name] is a 81 y.o. Male who presents today for his Annual Wellness Visit.  HPI 81 year old Male seen also for  health maintenance exam. He is not very ambulatory. Has generalized weakness.He is in dialysis 3 days a week. Wife brings him in by wheelchair.  At last visit in July he had not been receiving B12 injections consistently.  Wife now has someone willing to come to the home to give him low-dose.  He saw Dr. Sima Matas and had formal neuropsychological evaluation just over a year ago.  His memory is worse according to his wife.  He is on Aricept and Namenda but they do not seem to have helped very much.  Hemoglobin A1c was checked in May and was stable at 6.2%.  He has intermittent bouts of abdominal pain and sometimes the etiology is not clear.  He usually goes to the emergency department.  He had left lower quadrant pain in February 2023 and was seen at the emergency department with history of GI bleed with AV malformations.  He was admitted at that time and underwent small bowel enteroscopy.  The study was normal.  No bleeding or tumor was noted.  He had another visit to the hospital on July 17 with abdominal pain and left lower quadrant tenderness.  CT of abdomen and pelvis showed bilateral renal atrophy and cysts.  7 mm nodule in the lingula was unchanged and stable.  Pancreas was unremarkable.  No liver abnormality noted.  No gallstones.  There was some concern about cystitis.  Patient was given Rocephin and was sent home on doxycycline.   Patient recently requested consultation with Dr. Ellene Route.  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  He had bilateral bur holes placed by Dr. Ellene Route and went to  rehab for.  Of time.  History of GI bleeding in early October 2019.  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  He had kyphoplasty L3 and L5 in the remote past secondary to a fall.  He had endplate disruption of L1 and L2 without frank compression in April 2019 due to a fall.  Insulin-dependent diabetes is followed by Dr. Dwyane Dee, endocrinologist.  He receives Prolia for osteoporosis.  He is on thyroid replacement medication for hypothyroidism. Has seen Dr. Ellene Route recently. Dr. Ellene Route ordered repeat CT of his head. He did not believe he needed any further interventions at this point. 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 Butalbital has helped his headaches but not his back pain. He has multiple lumbar compression fractures and osteoporosis.  History of small bowel obstruction treated with surgical lysis of adhesions in 1996.  Sometimes gets diverticulitis in response to Cipro and Flagyl.  Had right intertrochanteric femoral fracture February 2022 requiring admission to the hospital and subsequent rehab stay.  Social history: Retired from [employer].  He used to work part-time at an [employer] on [address].  He is married.  Wife works for the [employer], [employer].  1 adopted daughter.  Non-smoker.  Social alcohol consumption.  Family history:  Father died of congestive heart failure at age 50.  Mother died at age 88 of a brain aneurysm.  He is an only child.  He receives home visits from Palliative Care. Have discussed Hospice care but patient and his wife do not want these services at this time.   Social History   Social History Narrative   Lives at home with wife.   Caffeine use: Drinks no tea, "a little soda"   Drinks 1 cup coffee/week   Right handed   VETERAN    Patient Care Team: Elby Showers, MD as PCP - General (Internal Medicine) Minus Breeding, MD as PCP - Cardiology (Cardiology) Jamal Maes, MD as Consulting Physician (Nephrology) Lady Gary, [facility]  Review of Systems see above-malaise and fatigue as well as confusion consistent with dementia   Objective    Vitals: BP (!) 112/46   Pulse 93   Temp 99 F (37.2 C) (Tympanic)   Ht '5\' 7"'$  (1.702 m)   Wt 119 lb (54 kg)   SpO2 99%   BMI 18.64 kg/m   Our records indicate he has lost 25 pounds since August 2022.  Physical Exam Skin: Warm and dry.  No cervical adenopathy or carotid bruits.  Chest clear.  Cardiac exam: Regular rate and rhythm without ectopy.  No lower extremity pitting edema.  Abdominal exam: No masses.  Patient seen today in his wheelchair.  Brief neurological exam: No gross focal deficits but he is confused and not oriented to day of week, month, or year.  He is pleasant and cooperative today.  Prostate is not enlarged.   Most recent functional status assessment:    05/22/2022    3:24 PM  In your present state of health, do you have any difficulty performing the following activities:  Hearing? 0  Vision? 0  Difficulty concentrating or making decisions? 0  Walking or climbing stairs? 0  Dressing or bathing? 1  Doing errands, shopping? 1  Preparing Food and eating ? N  Using the Toilet? Y  In the past six months, have you accidently leaked urine? N  Do you have problems with loss of bowel control? N  Managing your Medications? Y  Managing your Finances? Y  Housekeeping or managing your Housekeeping? Y   Most recent fall risk assessment:    05/22/2022    3:21 PM  [place] in the past year? 0  Number falls in past yr: 0  Injury with Fall? 0  Risk for fall due to : No Fall Risks  Follow up Falls evaluation completed    Most recent depression screenings:    05/22/2022    3:22 PM 01/05/2021   11:53 AM  PHQ 2/9 Scores  PHQ - 2 Score 1 6  PHQ- 9 Score  7 18   Most recent cognitive screening:    05/22/2022    3:25 PM  6CIT Screen  What Year? 4 points  What month? 3 points  What time? 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 Ellene Route.  No further intervention recommended.  Has  seen Dr. Sima Matas for memory loss  Hyperlipidemia treated with Zocor  GE reflux treated with Prilosec  Hypothyroidism treated with thyroid replacement medication  B12 deficiency-now getting monthly B12 injections on a regular basis  History of gout treated with allopurinol  Type 2 diabetes mellitus followed by Dr. Dwyane Dee. Is on Prandin.  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  Have discussed hospice but wife is not ready to have hospice intervention at this point in time.  They are planning a trip to the beach.  He will continue with dialysis 3 times a week.  He will continue with Seroquel which seems to help his disposition and help him rest.  He will continue with number and Aricept.  He did receive Forteo injection by Dr. Dwyane Dee for osteoporosis on August 21.  He remains on thyroid replacement medication for hypothyroidism.  He remains on omeprazole.  Hypothyroidism Dwyane Dee has him on Prandin for glucose intolerance.  Hemoglobin A1c stable at 6.5%.  Hemoglobin 10.5 g with normal MCV.  He does have a history of atrial tachycardia and short episodes of atrial fibrillation.  He is not a candidate for anticoagulation per cardiology due to high risk of bleeding. He will return here in one  year or as needed. Has close f/u at dialysis.          Annual wellness visit done today including the all of the following: Reviewed patient's Family Medical History Reviewed and updated list of patient's medical providers Assessment of cognitive impairment was done Assessed patient's functional ability Established a written schedule for health screening [place] Completed and Reviewed  Discussed health benefits of physical activity, and encouraged him to engage in regular exercise appropriate for his age and condition.         IElby Showers, MD, have reviewed all documentation for this visit. The documentation on 06/05/22 for the exam, diagnosis, procedures, and orders are all accurate and complete.   LaVon Barron Alvine, CMA

## 2022-05-23 DIAGNOSIS — D631 Anemia in chronic kidney disease: Secondary | ICD-10-CM | POA: Diagnosis not present

## 2022-05-23 DIAGNOSIS — N186 End stage renal disease: Secondary | ICD-10-CM | POA: Diagnosis not present

## 2022-05-23 DIAGNOSIS — Z992 Dependence on renal dialysis: Secondary | ICD-10-CM | POA: Diagnosis not present

## 2022-05-23 DIAGNOSIS — E876 Hypokalemia: Secondary | ICD-10-CM | POA: Diagnosis not present

## 2022-05-23 DIAGNOSIS — N2581 Secondary hyperparathyroidism of renal origin: Secondary | ICD-10-CM | POA: Diagnosis not present

## 2022-05-24 ENCOUNTER — Ambulatory Visit
Admission: RE | Admit: 2022-05-24 | Discharge: 2022-05-24 | Disposition: A | Discharge status: Home / Self Care | Payer: Medicare Other | Source: Ambulatory Visit | Attending: Neurological Surgery | Admitting: Neurological Surgery

## 2022-05-24 DIAGNOSIS — I6200 Nontraumatic subdural hemorrhage, unspecified: Secondary | ICD-10-CM | POA: Diagnosis not present

## 2022-05-24 DIAGNOSIS — Z87828 Personal history of other (healed) physical injury and trauma: Secondary | ICD-10-CM | POA: Diagnosis not present

## 2022-05-24 DIAGNOSIS — S065XAA Traumatic subdural hemorrhage with loss of consciousness status unknown, initial encounter: Secondary | ICD-10-CM

## 2022-05-24 DIAGNOSIS — S065X9A Traumatic subdural hemorrhage with loss of consciousness of unspecified duration, initial encounter: Secondary | ICD-10-CM | POA: Diagnosis not present

## 2022-05-25 ENCOUNTER — Other Ambulatory Visit: Payer: Self-pay | Admitting: Endocrinology

## 2022-05-25 DIAGNOSIS — D509 Iron deficiency anemia, unspecified: Secondary | ICD-10-CM | POA: Diagnosis not present

## 2022-05-25 DIAGNOSIS — Z992 Dependence on renal dialysis: Secondary | ICD-10-CM | POA: Diagnosis not present

## 2022-05-25 DIAGNOSIS — D631 Anemia in chronic kidney disease: Secondary | ICD-10-CM | POA: Diagnosis not present

## 2022-05-25 DIAGNOSIS — E1129 Type 2 diabetes mellitus with other diabetic kidney complication: Secondary | ICD-10-CM | POA: Diagnosis not present

## 2022-05-25 DIAGNOSIS — N2581 Secondary hyperparathyroidism of renal origin: Secondary | ICD-10-CM | POA: Diagnosis not present

## 2022-05-25 DIAGNOSIS — E1122 Type 2 diabetes mellitus with diabetic chronic kidney disease: Secondary | ICD-10-CM | POA: Diagnosis not present

## 2022-05-25 DIAGNOSIS — N186 End stage renal disease: Secondary | ICD-10-CM | POA: Diagnosis not present

## 2022-05-25 DIAGNOSIS — E876 Hypokalemia: Secondary | ICD-10-CM | POA: Diagnosis not present

## 2022-05-29 DIAGNOSIS — D509 Iron deficiency anemia, unspecified: Secondary | ICD-10-CM | POA: Diagnosis not present

## 2022-05-29 DIAGNOSIS — N186 End stage renal disease: Secondary | ICD-10-CM | POA: Diagnosis not present

## 2022-05-29 DIAGNOSIS — N2581 Secondary hyperparathyroidism of renal origin: Secondary | ICD-10-CM | POA: Diagnosis not present

## 2022-05-29 DIAGNOSIS — Z992 Dependence on renal dialysis: Secondary | ICD-10-CM | POA: Diagnosis not present

## 2022-05-31 DIAGNOSIS — D509 Iron deficiency anemia, unspecified: Secondary | ICD-10-CM | POA: Diagnosis not present

## 2022-05-31 DIAGNOSIS — Z992 Dependence on renal dialysis: Secondary | ICD-10-CM | POA: Diagnosis not present

## 2022-05-31 DIAGNOSIS — N186 End stage renal disease: Secondary | ICD-10-CM | POA: Diagnosis not present

## 2022-05-31 DIAGNOSIS — N2581 Secondary hyperparathyroidism of renal origin: Secondary | ICD-10-CM | POA: Diagnosis not present

## 2022-06-04 ENCOUNTER — Emergency Department (HOSPITAL_COMMUNITY): Payer: Medicare Other

## 2022-06-04 ENCOUNTER — Emergency Department (HOSPITAL_COMMUNITY)
Admission: EM | Admit: 2022-06-04 | Discharge: 2022-06-05 | Disposition: A | Discharge status: Home / Self Care | Payer: Medicare Other | Source: Home / Self Care | Attending: Emergency Medicine | Admitting: Emergency Medicine

## 2022-06-04 ENCOUNTER — Other Ambulatory Visit: Payer: Self-pay

## 2022-06-04 DIAGNOSIS — I700 Atherosclerosis of aorta: Secondary | ICD-10-CM | POA: Diagnosis present

## 2022-06-04 DIAGNOSIS — E1165 Type 2 diabetes mellitus with hyperglycemia: Secondary | ICD-10-CM | POA: Diagnosis not present

## 2022-06-04 DIAGNOSIS — E119 Type 2 diabetes mellitus without complications: Secondary | ICD-10-CM | POA: Insufficient documentation

## 2022-06-04 DIAGNOSIS — I6203 Nontraumatic chronic subdural hemorrhage: Secondary | ICD-10-CM | POA: Diagnosis not present

## 2022-06-04 DIAGNOSIS — I480 Paroxysmal atrial fibrillation: Secondary | ICD-10-CM | POA: Diagnosis not present

## 2022-06-04 DIAGNOSIS — Z992 Dependence on renal dialysis: Secondary | ICD-10-CM | POA: Diagnosis not present

## 2022-06-04 DIAGNOSIS — R4182 Altered mental status, unspecified: Secondary | ICD-10-CM | POA: Diagnosis not present

## 2022-06-04 DIAGNOSIS — N3289 Other specified disorders of bladder: Secondary | ICD-10-CM | POA: Diagnosis not present

## 2022-06-04 DIAGNOSIS — E785 Hyperlipidemia, unspecified: Secondary | ICD-10-CM | POA: Diagnosis not present

## 2022-06-04 DIAGNOSIS — N2581 Secondary hyperparathyroidism of renal origin: Secondary | ICD-10-CM | POA: Diagnosis not present

## 2022-06-04 DIAGNOSIS — I1311 Hypertensive heart and chronic kidney disease without heart failure, with stage 5 chronic kidney disease, or end stage renal disease: Secondary | ICD-10-CM | POA: Diagnosis not present

## 2022-06-04 DIAGNOSIS — I2510 Atherosclerotic heart disease of native coronary artery without angina pectoris: Secondary | ICD-10-CM | POA: Diagnosis not present

## 2022-06-04 DIAGNOSIS — E782 Mixed hyperlipidemia: Secondary | ICD-10-CM | POA: Diagnosis present

## 2022-06-04 DIAGNOSIS — I429 Cardiomyopathy, unspecified: Secondary | ICD-10-CM | POA: Diagnosis not present

## 2022-06-04 DIAGNOSIS — I214 Non-ST elevation (NSTEMI) myocardial infarction: Secondary | ICD-10-CM | POA: Diagnosis not present

## 2022-06-04 DIAGNOSIS — R109 Unspecified abdominal pain: Secondary | ICD-10-CM

## 2022-06-04 DIAGNOSIS — R001 Bradycardia, unspecified: Secondary | ICD-10-CM | POA: Diagnosis not present

## 2022-06-04 DIAGNOSIS — F0390 Unspecified dementia without behavioral disturbance: Secondary | ICD-10-CM | POA: Insufficient documentation

## 2022-06-04 DIAGNOSIS — R0789 Other chest pain: Secondary | ICD-10-CM | POA: Diagnosis not present

## 2022-06-04 DIAGNOSIS — K219 Gastro-esophageal reflux disease without esophagitis: Secondary | ICD-10-CM | POA: Diagnosis present

## 2022-06-04 DIAGNOSIS — I10 Essential (primary) hypertension: Secondary | ICD-10-CM | POA: Insufficient documentation

## 2022-06-04 DIAGNOSIS — E039 Hypothyroidism, unspecified: Secondary | ICD-10-CM | POA: Diagnosis present

## 2022-06-04 DIAGNOSIS — R0602 Shortness of breath: Secondary | ICD-10-CM | POA: Diagnosis not present

## 2022-06-04 DIAGNOSIS — E038 Other specified hypothyroidism: Secondary | ICD-10-CM | POA: Diagnosis not present

## 2022-06-04 DIAGNOSIS — D638 Anemia in other chronic diseases classified elsewhere: Secondary | ICD-10-CM | POA: Diagnosis not present

## 2022-06-04 DIAGNOSIS — D631 Anemia in chronic kidney disease: Secondary | ICD-10-CM | POA: Diagnosis not present

## 2022-06-04 DIAGNOSIS — R079 Chest pain, unspecified: Secondary | ICD-10-CM | POA: Diagnosis not present

## 2022-06-04 DIAGNOSIS — E876 Hypokalemia: Secondary | ICD-10-CM | POA: Diagnosis not present

## 2022-06-04 DIAGNOSIS — E1122 Type 2 diabetes mellitus with diabetic chronic kidney disease: Secondary | ICD-10-CM | POA: Diagnosis not present

## 2022-06-04 DIAGNOSIS — E8770 Fluid overload, unspecified: Secondary | ICD-10-CM | POA: Diagnosis not present

## 2022-06-04 DIAGNOSIS — M1990 Unspecified osteoarthritis, unspecified site: Secondary | ICD-10-CM | POA: Diagnosis present

## 2022-06-04 DIAGNOSIS — N186 End stage renal disease: Secondary | ICD-10-CM | POA: Diagnosis not present

## 2022-06-04 DIAGNOSIS — J9811 Atelectasis: Secondary | ICD-10-CM | POA: Diagnosis present

## 2022-06-04 DIAGNOSIS — E1129 Type 2 diabetes mellitus with other diabetic kidney complication: Secondary | ICD-10-CM | POA: Diagnosis not present

## 2022-06-04 DIAGNOSIS — E7800 Pure hypercholesterolemia, unspecified: Secondary | ICD-10-CM | POA: Diagnosis not present

## 2022-06-04 DIAGNOSIS — I132 Hypertensive heart and chronic kidney disease with heart failure and with stage 5 chronic kidney disease, or end stage renal disease: Secondary | ICD-10-CM | POA: Diagnosis not present

## 2022-06-04 DIAGNOSIS — D696 Thrombocytopenia, unspecified: Secondary | ICD-10-CM | POA: Diagnosis not present

## 2022-06-04 DIAGNOSIS — G309 Alzheimer's disease, unspecified: Secondary | ICD-10-CM | POA: Diagnosis not present

## 2022-06-04 DIAGNOSIS — E1151 Type 2 diabetes mellitus with diabetic peripheral angiopathy without gangrene: Secondary | ICD-10-CM | POA: Diagnosis present

## 2022-06-04 DIAGNOSIS — I9589 Other hypotension: Secondary | ICD-10-CM | POA: Diagnosis present

## 2022-06-04 DIAGNOSIS — F0280 Dementia in other diseases classified elsewhere without behavioral disturbance: Secondary | ICD-10-CM | POA: Diagnosis present

## 2022-06-04 DIAGNOSIS — D649 Anemia, unspecified: Secondary | ICD-10-CM | POA: Diagnosis not present

## 2022-06-04 DIAGNOSIS — R1032 Left lower quadrant pain: Secondary | ICD-10-CM | POA: Diagnosis not present

## 2022-06-04 DIAGNOSIS — Z66 Do not resuscitate: Secondary | ICD-10-CM | POA: Diagnosis not present

## 2022-06-04 DIAGNOSIS — R778 Other specified abnormalities of plasma proteins: Secondary | ICD-10-CM | POA: Diagnosis not present

## 2022-06-04 DIAGNOSIS — N250 Renal osteodystrophy: Secondary | ICD-10-CM | POA: Diagnosis not present

## 2022-06-04 DIAGNOSIS — R1084 Generalized abdominal pain: Secondary | ICD-10-CM | POA: Diagnosis not present

## 2022-06-04 DIAGNOSIS — Z7401 Bed confinement status: Secondary | ICD-10-CM | POA: Diagnosis not present

## 2022-06-04 DIAGNOSIS — I5021 Acute systolic (congestive) heart failure: Secondary | ICD-10-CM | POA: Diagnosis not present

## 2022-06-04 LAB — LIPASE, BLOOD: Lipase: 46 U/L (ref 11–51)

## 2022-06-04 LAB — COMPREHENSIVE METABOLIC PANEL
ALT: 11 U/L (ref 0–44)
AST: 26 U/L (ref 15–41)
Albumin: 3 g/dL — ABNORMAL LOW (ref 3.5–5.0)
Alkaline Phosphatase: 126 U/L (ref 38–126)
Anion gap: 15 (ref 5–15)
BUN: 23 mg/dL (ref 8–23)
CO2: 30 mmol/L (ref 22–32)
Calcium: 8.4 mg/dL — ABNORMAL LOW (ref 8.9–10.3)
Chloride: 94 mmol/L — ABNORMAL LOW (ref 98–111)
Creatinine, Ser: 5.35 mg/dL — ABNORMAL HIGH (ref 0.61–1.24)
GFR, Estimated: 10 mL/min — ABNORMAL LOW (ref >60)
Glucose, Bld: 134 mg/dL — ABNORMAL HIGH (ref 70–99)
Potassium: 3.3 mmol/L — ABNORMAL LOW (ref 3.5–5.1)
Sodium: 139 mmol/L (ref 135–145)
Total Bilirubin: 0.3 mg/dL (ref 0.3–1.2)
Total Protein: 6.3 g/dL — ABNORMAL LOW (ref 6.5–8.1)

## 2022-06-04 LAB — CBC WITH DIFFERENTIAL/PLATELET
Abs Immature Granulocytes: 0.01 10*3/uL (ref 0.00–0.07)
Basophils Absolute: 0.1 10*3/uL (ref 0.0–0.1)
Basophils Relative: 1 %
Eosinophils Absolute: 0.2 10*3/uL (ref 0.0–0.5)
Eosinophils Relative: 3 %
HCT: 25.9 % — ABNORMAL LOW (ref 39.0–52.0)
Hemoglobin: 8.6 g/dL — ABNORMAL LOW (ref 13.0–17.0)
Immature Granulocytes: 0 %
Lymphocytes Relative: 20 %
Lymphs Abs: 1.3 10*3/uL (ref 0.7–4.0)
MCH: 32.8 pg (ref 26.0–34.0)
MCHC: 33.2 g/dL (ref 30.0–36.0)
MCV: 98.9 fL (ref 80.0–100.0)
Monocytes Absolute: 0.6 10*3/uL (ref 0.1–1.0)
Monocytes Relative: 10 %
Neutro Abs: 4.5 10*3/uL (ref 1.7–7.7)
Neutrophils Relative %: 66 %
Platelets: 186 10*3/uL (ref 150–400)
RBC: 2.62 MIL/uL — ABNORMAL LOW (ref 4.22–5.81)
RDW: 17.1 % — ABNORMAL HIGH (ref 11.5–15.5)
WBC: 6.7 10*3/uL (ref 4.0–10.5)
nRBC: 0 % (ref 0.0–0.2)

## 2022-06-04 LAB — POC OCCULT BLOOD, ED: Fecal Occult Bld: NEGATIVE

## 2022-06-04 NOTE — ED Triage Notes (Signed)
Pt BIB EMS doe abd pain left side from dialysis. Soft, nontender. Pt finished dialysis. Pt is axox2 at baseline.

## 2022-06-04 NOTE — ED Notes (Signed)
Patient transported to X-ray 

## 2022-06-04 NOTE — ED Provider Notes (Signed)
[facility] EMERGENCY DEPARTMENT Provider Note   CSN: [id number] Arrival date & time: 06/04/22  1558     History  Chief Complaint  Patient presents with   Abdominal Pain    [name] is a 81 y.o. male.   Abdominal Pain Patient reported comes from dialysis with left-sided pain.  Has history dementia cannot provide much history.  Does state he is tender on abdomen.  States did not have dialysis today.  Past Medical History:  Diagnosis Date   Allergy    Alzheimer's disease ([place]) 01/19/2021   Anemia    Arthritis    Cataract    bil cateracts removed   Coronary artery disease    Dementia arising in the senium and presenium ([place])    Diabetes mellitus    Type 2   Diverticulitis    ED (erectile dysfunction)    Elevated homocysteine    ESRD (end stage renal disease) on dialysis ([place]) 03/2015   M-W-F dialysis   GERD (gastroesophageal reflux disease)    pepto    Gout    Hiatal hernia    Hyperlipidemia    Hypertension    Hypothyroidism    PVD (peripheral vascular disease) ([place])    has plastic aorta   Renal insufficiency    Sleep apnea    does not wear c-pap        Home Medications Prior to Admission medications   Medication Sig Start Date End Date Taking? Authorizing Provider  acetaminophen (TYLENOL) 325 MG tablet Take 1-2 tablets (325-650 mg total) by mouth every 4 (four) hours as needed for mild pain. 08/01/18   Love, Ivan Anchors, PA-C  albuterol (VENTOLIN HFA) 108 (90 Base) MCG/ACT inhaler Inhale 1 puff into the lungs daily as needed for wheezing or shortness of breath.    [provider]  allopurinol (ZYLOPRIM) 100 MG tablet Take 100 mg by mouth daily.    [provider]  Blood Glucose Monitoring Suppl (FREESTYLE FREEDOM LITE) w/Device KIT Use to check blood sugar 2 times per day dx code E11.65 10/21/18   Elayne Snare, MD  camphor-menthol [facility]) lotion Apply 1 application topically 4 (four) times daily as needed for itching.      [provider]  Carboxymethylcellulose Sodium (THERATEARS) 0.25 % SOLN Place 1 drop into both eyes daily as needed (for dryness).    [provider]  cetirizine (ZYRTEC) 10 MG tablet Take 10 mg by mouth daily as needed for allergies.    [provider]  cyanocobalamin (,VITAMIN B-12,) 1000 MCG/ML injection Inject 1 mL (1,000 mcg total) into the muscle every 30 (thirty) days. 04/13/22   Elby Showers, MD  dicyclomine (BENTYL) 10 MG capsule Take 1 capsule (10 mg total) by mouth every 8 (eight) hours as needed for spasms. 12/12/21   Cirigliano, Vito V, DO  donepezil (ARICEPT) 10 MG tablet Take 1 tablet (10 mg total) by mouth at bedtime. 09/26/21   Melvenia Beam, MD  doxycycline (VIBRAMYCIN) 100 MG capsule Take 1 capsule (100 mg total) by mouth 2 (two) times daily. 04/10/22   Nelta Numbers, MD  fluticasone (FLONASE) 50 MCG/ACT nasal spray Place 1 spray into both nostrils daily. Patient taking differently: Place 1 spray into both nostrils daily as needed for allergies. 10/08/14   Mikhail, Velta Addison, DO  FORTEO 600 MCG/2.4ML SOPN INJECT 20 MCG UNDER THE SKIN DAILY (DISCARD 28 DAYS AFTER INITIAL USE) 05/14/22   Elayne Snare, MD  glucose blood (FREESTYLE LITE) test strip  USE AS DIRECTED THREE TIMES DAILY 02/16/22   Elayne Snare, MD  hydrocortisone 2.5 % cream Apply 1 application topically 3 (three) times daily as needed (skin irritation (legs & arms)).  01/13/19   [provider]  Lancets (FREESTYLE) lancets Use as instructed to check blood sugar 2 times per day dx code E11.65 02/22/16   Elayne Snare, MD  levothyroxine (SYNTHROID, LEVOTHROID) 50 MCG tablet Take 1 tablet (50 mcg total) by mouth daily. Patient taking differently: Take 50 mcg by mouth daily before breakfast. 08/06/14   Baxley, Cresenciano Lick, MD  lidocaine (LIDODERM) 5 % Place 1 patch onto the skin daily as needed (back pain). 11/12/19   [provider]  memantine (NAMENDA) 10 MG tablet Take 1 tablet (10 mg total)  by mouth 2 (two) times daily. 09/26/21   Melvenia Beam, MD  midodrine (PROAMATINE) 10 MG tablet Take one pill prior to hemodialysis on MWF Patient taking differently: Take 5 mg by mouth See admin instructions. Take one tablet by mouth prior to hemodialysis on MWF 08/13/18   Love, Ivan Anchors, PA-C  Nutritional Supplements (FEEDING SUPPLEMENT, NEPRO CARB STEADY,) LIQD Take 237 mLs by mouth daily.    [provider]  omeprazole (PRILOSEC) 40 MG capsule Take 1 capsule (40 mg total) by mouth daily. 03/13/18   Esterwood, Amy S, PA-C  QUEtiapine (SEROQUEL) 25 MG tablet Take 1 tablet (25 mg total) by mouth at bedtime. 05/22/22   Elby Showers, MD  repaglinide (PRANDIN) 0.5 MG tablet TAKE 1 TABLET(0.5 MG) BY MOUTH TWICE DAILY BEFORE A MEAL 05/29/22   Elayne Snare, MD  sevelamer carbonate (RENVELA) 800 MG tablet Take 1,600-2,400 mg by mouth See admin instructions. Take 2,400 mg by mouth three times a day with meals and 1,600 mg with snacks per spouse    [provider]  simvastatin (ZOCOR) 20 MG tablet Take 20 mg by mouth at bedtime.    [provider]  Syringe/Needle, Disp, (SYRINGE 3CC/25GX1") 25G X 1" 3 ML MISC 1 each by Does not apply route every 30 (thirty) days. 04/13/22   Elby Showers, MD  TRADJENTA 5 MG TABS tablet Take 1 tablet (5 mg total) by mouth daily. 11/16/21   Elayne Snare, MD      Allergies    Ambien [zolpidem tartrate] and Penicillins    Review of Systems   Review of Systems  Gastrointestinal:  Positive for abdominal pain.    Physical Exam Updated Vital Signs BP (!) 129/55 (BP Location: Left Arm)   Pulse 78   Temp 98.2 F (36.8 C) (Oral)   Resp 18   SpO2 98%  Physical Exam Vitals and nursing note reviewed.  HENT:     Head: Atraumatic.  Cardiovascular:     Rate and Rhythm: Normal rate and regular rhythm.  Abdominal:     Comments: Mild left-sided abdominal tenderness.  No rebound or guarding.  No distention.  No hernia.  Skin:    Capillary Refill:  Capillary refill takes less than 2 seconds.     Comments: Dressing to right upper arm likely from dialysis.  Neurological:     Mental Status: He is alert.     Comments: Patient with some dementia.  Appears to be at reported baseline.     ED Results / Procedures / Treatments   Labs (all labs ordered are listed, but only abnormal results are displayed) Labs Reviewed  CBC WITH DIFFERENTIAL/PLATELET - Abnormal; Notable for the following components:  Result Value   RBC 2.62 (*)    Hemoglobin 8.6 (*)    HCT 25.9 (*)    RDW 17.1 (*)    All other components within normal limits  COMPREHENSIVE METABOLIC PANEL - Abnormal; Notable for the following components:   Potassium 3.3 (*)    Chloride 94 (*)    Glucose, Bld 134 (*)    Creatinine, Ser 5.35 (*)    Calcium 8.4 (*)    Total Protein 6.3 (*)    Albumin 3.0 (*)    GFR, Estimated 10 (*)    All other components within normal limits  LIPASE, BLOOD  URINALYSIS, ROUTINE W REFLEX MICROSCOPIC  POC OCCULT BLOOD, ED    EKG None  Radiology DG Abd 2 Views  Result Date: 06/04/2022 CLINICAL DATA:  Abdominal pain EXAM: ABDOMEN - 2 VIEW COMPARISON:  01/27/2007 FINDINGS: Bowel gas pattern is nonspecific. There is presence of gas in few slightly dilated small bowel loops. Stomach is not distended. Gas and stool are present in colon. There is no fecal impaction in rectum. There is no pneumoperitoneum. Extensive arterial calcifications are noted. There is vertebroplasty in L3 vertebra. There is decrease in height of multiple lumbar vertebral bodies. There is previous internal fixation in right femur. Surgical clips are seen in the para-aortic region and left inguinal region. IMPRESSION: Presence of gas in few slightly dilated small bowel loops may suggest ileus. There is no definite evidence of intestinal obstruction or pneumoperitoneum. Other findings as described in the body of the report. Electronically Signed   By: Elmer Picker M.D.   On:  06/04/2022 17:30    Procedures Procedures    Medications Ordered in ED Medications - No data to display  ED Course/ Medical Decision Making/ A&P                           Medical Decision Making Amount and/or Complexity of Data Reviewed Labs: ordered. Radiology: ordered.   Patient presents from dialysis.  Reported abdominal pain.  Only mild tenderness.  Does have history of mention right much history.  Somewhat inconsistent exam also.  We will get basic blood work and x-ray.  Differential diagnosis includes abdominal pain, diverticulitis, UTI, constipation  Lab work does show an anemia.  Has had it previously.  Hemoglobin down 2 g over the last couple weeks.  However vitals reassuring.  Guaiac negative.  Do not think requires admission to the hospital at this time.  Can follow-up with PCP and does see dialysis 3 times a week.  Will discharge home.  X-ray showed potentially small ileus.  Doubt obstruction.  Benign exam and doubt severe infection or intra-abdominal pathology at this time        Final Clinical Impression(s) / ED Diagnoses Final diagnoses:  Abdominal pain, unspecified abdominal location  Anemia, unspecified type    Rx / DC Orders ED Discharge Orders     None         Davonna Belling, MD 06/04/22 2344

## 2022-06-04 NOTE — ED Notes (Signed)
Wife Story Vanvranken [phone] would like an update asap

## 2022-06-04 NOTE — ED Notes (Signed)
Patient pulled into room #39 for MD Pickering to perform fecal occult. Sent to [facility].

## 2022-06-04 NOTE — ED Notes (Signed)
Pt repositioned in bed. Resting comfortably at this time with no other complaints.

## 2022-06-04 NOTE — Discharge Instructions (Addendum)
Your hemoglobin was down to 8.6 today.  Down 2 g from a couple weeks ago.  There was no blood on your rectal exam however.  You can follow-up with your doctors and have it checked in the next couple days.  Return for lightheadedness or dizziness.

## 2022-06-05 ENCOUNTER — Other Ambulatory Visit (INDEPENDENT_AMBULATORY_CARE_PROVIDER_SITE_OTHER): Payer: Medicare Other

## 2022-06-05 ENCOUNTER — Other Ambulatory Visit: Payer: Self-pay | Admitting: Endocrinology

## 2022-06-05 DIAGNOSIS — D696 Thrombocytopenia, unspecified: Secondary | ICD-10-CM | POA: Diagnosis not present

## 2022-06-05 DIAGNOSIS — E1165 Type 2 diabetes mellitus with hyperglycemia: Secondary | ICD-10-CM | POA: Diagnosis not present

## 2022-06-05 DIAGNOSIS — R1084 Generalized abdominal pain: Secondary | ICD-10-CM | POA: Diagnosis not present

## 2022-06-05 DIAGNOSIS — R4182 Altered mental status, unspecified: Secondary | ICD-10-CM | POA: Diagnosis not present

## 2022-06-05 DIAGNOSIS — Z7401 Bed confinement status: Secondary | ICD-10-CM | POA: Diagnosis not present

## 2022-06-05 LAB — CBC
HCT: 27.9 % — ABNORMAL LOW (ref 39.0–52.0)
Hemoglobin: 9.2 g/dL — ABNORMAL LOW (ref 13.0–17.0)
MCHC: 33 g/dL (ref 30.0–36.0)
MCV: 99.5 fl (ref 78.0–100.0)
Platelets: 207 10*3/uL (ref 150.0–400.0)
RBC: 2.8 Mil/uL — ABNORMAL LOW (ref 4.22–5.81)
RDW: 18.8 % — ABNORMAL HIGH (ref 11.5–15.5)
WBC: 6.8 10*3/uL (ref 4.0–10.5)

## 2022-06-05 LAB — GLUCOSE, RANDOM: Glucose, Bld: 101 mg/dL — ABNORMAL HIGH (ref 70–99)

## 2022-06-05 LAB — CALCIUM: Calcium: 9.2 mg/dL (ref 8.4–10.5)

## 2022-06-05 NOTE — ED Notes (Signed)
Wife Corbin Falck) updated on patient's condition and plan to discharge and use [facility] home. Patient's wife states she is aware

## 2022-06-05 NOTE — Patient Instructions (Addendum)
He is followed by Palliative Care. I have spoken with medical staff at [facility] about his condition. I have had a discussion with wife about Hospice Care in the not too distant future. He has dementia but at times is lucid and quite conversant. Seroquel has helped him sleep. He and wife are taking a trip to the beach soon. She continues to work outside the home.

## 2022-06-06 ENCOUNTER — Ambulatory Visit (INDEPENDENT_AMBULATORY_CARE_PROVIDER_SITE_OTHER): Payer: Medicare Other | Admitting: Nurse Practitioner

## 2022-06-06 ENCOUNTER — Encounter: Payer: Self-pay | Admitting: Nurse Practitioner

## 2022-06-06 ENCOUNTER — Encounter (HOSPITAL_COMMUNITY): Payer: Self-pay

## 2022-06-06 ENCOUNTER — Inpatient Hospital Stay (HOSPITAL_COMMUNITY)
Admission: EM | Admit: 2022-06-06 | Discharge: 2022-06-10 | DRG: 947 | Disposition: A | Discharge status: Home Health | Payer: Medicare Other | Source: Ambulatory Visit | Attending: Internal Medicine | Admitting: Internal Medicine

## 2022-06-06 ENCOUNTER — Emergency Department (HOSPITAL_COMMUNITY): Payer: Medicare Other

## 2022-06-06 ENCOUNTER — Other Ambulatory Visit: Payer: Self-pay

## 2022-06-06 VITALS — BP 126/58 | HR 135 | Ht 67.0 in | Wt 156.6 lb

## 2022-06-06 DIAGNOSIS — I429 Cardiomyopathy, unspecified: Secondary | ICD-10-CM | POA: Diagnosis present

## 2022-06-06 DIAGNOSIS — N2581 Secondary hyperparathyroidism of renal origin: Secondary | ICD-10-CM | POA: Diagnosis present

## 2022-06-06 DIAGNOSIS — I2510 Atherosclerotic heart disease of native coronary artery without angina pectoris: Secondary | ICD-10-CM | POA: Diagnosis present

## 2022-06-06 DIAGNOSIS — M898X9 Other specified disorders of bone, unspecified site: Secondary | ICD-10-CM | POA: Diagnosis present

## 2022-06-06 DIAGNOSIS — M1990 Unspecified osteoarthritis, unspecified site: Secondary | ICD-10-CM | POA: Diagnosis present

## 2022-06-06 DIAGNOSIS — E1151 Type 2 diabetes mellitus with diabetic peripheral angiopathy without gangrene: Secondary | ICD-10-CM | POA: Diagnosis present

## 2022-06-06 DIAGNOSIS — R0602 Shortness of breath: Secondary | ICD-10-CM | POA: Diagnosis not present

## 2022-06-06 DIAGNOSIS — R7989 Other specified abnormal findings of blood chemistry: Secondary | ICD-10-CM

## 2022-06-06 DIAGNOSIS — E038 Other specified hypothyroidism: Secondary | ICD-10-CM | POA: Diagnosis not present

## 2022-06-06 DIAGNOSIS — I214 Non-ST elevation (NSTEMI) myocardial infarction: Secondary | ICD-10-CM | POA: Diagnosis not present

## 2022-06-06 DIAGNOSIS — E1129 Type 2 diabetes mellitus with other diabetic kidney complication: Secondary | ICD-10-CM | POA: Diagnosis not present

## 2022-06-06 DIAGNOSIS — N3289 Other specified disorders of bladder: Secondary | ICD-10-CM | POA: Diagnosis not present

## 2022-06-06 DIAGNOSIS — F0280 Dementia in other diseases classified elsewhere without behavioral disturbance: Secondary | ICD-10-CM | POA: Diagnosis present

## 2022-06-06 DIAGNOSIS — R1084 Generalized abdominal pain: Secondary | ICD-10-CM | POA: Diagnosis present

## 2022-06-06 DIAGNOSIS — Z880 Allergy status to penicillin: Secondary | ICD-10-CM

## 2022-06-06 DIAGNOSIS — I6203 Nontraumatic chronic subdural hemorrhage: Secondary | ICD-10-CM | POA: Diagnosis present

## 2022-06-06 DIAGNOSIS — Z8249 Family history of ischemic heart disease and other diseases of the circulatory system: Secondary | ICD-10-CM

## 2022-06-06 DIAGNOSIS — Z66 Do not resuscitate: Secondary | ICD-10-CM | POA: Diagnosis present

## 2022-06-06 DIAGNOSIS — I132 Hypertensive heart and chronic kidney disease with heart failure and with stage 5 chronic kidney disease, or end stage renal disease: Secondary | ICD-10-CM | POA: Diagnosis present

## 2022-06-06 DIAGNOSIS — Z992 Dependence on renal dialysis: Secondary | ICD-10-CM

## 2022-06-06 DIAGNOSIS — R778 Other specified abnormalities of plasma proteins: Secondary | ICD-10-CM | POA: Diagnosis present

## 2022-06-06 DIAGNOSIS — Z79899 Other long term (current) drug therapy: Secondary | ICD-10-CM

## 2022-06-06 DIAGNOSIS — I5021 Acute systolic (congestive) heart failure: Secondary | ICD-10-CM | POA: Diagnosis present

## 2022-06-06 DIAGNOSIS — D631 Anemia in chronic kidney disease: Secondary | ICD-10-CM | POA: Diagnosis present

## 2022-06-06 DIAGNOSIS — R079 Chest pain, unspecified: Secondary | ICD-10-CM | POA: Diagnosis present

## 2022-06-06 DIAGNOSIS — D638 Anemia in other chronic diseases classified elsewhere: Secondary | ICD-10-CM | POA: Diagnosis present

## 2022-06-06 DIAGNOSIS — R109 Unspecified abdominal pain: Secondary | ICD-10-CM | POA: Diagnosis not present

## 2022-06-06 DIAGNOSIS — N250 Renal osteodystrophy: Secondary | ICD-10-CM | POA: Diagnosis not present

## 2022-06-06 DIAGNOSIS — E039 Hypothyroidism, unspecified: Secondary | ICD-10-CM | POA: Diagnosis present

## 2022-06-06 DIAGNOSIS — Z888 Allergy status to other drugs, medicaments and biological substances status: Secondary | ICD-10-CM

## 2022-06-06 DIAGNOSIS — R0789 Other chest pain: Secondary | ICD-10-CM | POA: Diagnosis not present

## 2022-06-06 DIAGNOSIS — Z833 Family history of diabetes mellitus: Secondary | ICD-10-CM

## 2022-06-06 DIAGNOSIS — E785 Hyperlipidemia, unspecified: Secondary | ICD-10-CM | POA: Diagnosis present

## 2022-06-06 DIAGNOSIS — G309 Alzheimer's disease, unspecified: Secondary | ICD-10-CM | POA: Diagnosis present

## 2022-06-06 DIAGNOSIS — E1122 Type 2 diabetes mellitus with diabetic chronic kidney disease: Secondary | ICD-10-CM | POA: Diagnosis present

## 2022-06-06 DIAGNOSIS — Z87891 Personal history of nicotine dependence: Secondary | ICD-10-CM

## 2022-06-06 DIAGNOSIS — E7800 Pure hypercholesterolemia, unspecified: Secondary | ICD-10-CM | POA: Diagnosis not present

## 2022-06-06 DIAGNOSIS — I9589 Other hypotension: Secondary | ICD-10-CM | POA: Diagnosis present

## 2022-06-06 DIAGNOSIS — I700 Atherosclerosis of aorta: Secondary | ICD-10-CM | POA: Diagnosis present

## 2022-06-06 DIAGNOSIS — G4733 Obstructive sleep apnea (adult) (pediatric): Secondary | ICD-10-CM | POA: Diagnosis present

## 2022-06-06 DIAGNOSIS — J9811 Atelectasis: Secondary | ICD-10-CM | POA: Diagnosis present

## 2022-06-06 DIAGNOSIS — I480 Paroxysmal atrial fibrillation: Secondary | ICD-10-CM | POA: Diagnosis present

## 2022-06-06 DIAGNOSIS — R1032 Left lower quadrant pain: Secondary | ICD-10-CM | POA: Diagnosis not present

## 2022-06-06 DIAGNOSIS — E119 Type 2 diabetes mellitus without complications: Secondary | ICD-10-CM | POA: Diagnosis not present

## 2022-06-06 DIAGNOSIS — Z7984 Long term (current) use of oral hypoglycemic drugs: Secondary | ICD-10-CM

## 2022-06-06 DIAGNOSIS — R001 Bradycardia, unspecified: Secondary | ICD-10-CM | POA: Diagnosis not present

## 2022-06-06 DIAGNOSIS — D649 Anemia, unspecified: Secondary | ICD-10-CM | POA: Diagnosis not present

## 2022-06-06 DIAGNOSIS — K219 Gastro-esophageal reflux disease without esophagitis: Secondary | ICD-10-CM | POA: Diagnosis present

## 2022-06-06 DIAGNOSIS — E782 Mixed hyperlipidemia: Secondary | ICD-10-CM | POA: Diagnosis present

## 2022-06-06 DIAGNOSIS — E876 Hypokalemia: Secondary | ICD-10-CM | POA: Diagnosis not present

## 2022-06-06 DIAGNOSIS — M109 Gout, unspecified: Secondary | ICD-10-CM | POA: Diagnosis present

## 2022-06-06 DIAGNOSIS — Z79890 Hormone replacement therapy (postmenopausal): Secondary | ICD-10-CM

## 2022-06-06 DIAGNOSIS — I1311 Hypertensive heart and chronic kidney disease without heart failure, with stage 5 chronic kidney disease, or end stage renal disease: Secondary | ICD-10-CM | POA: Diagnosis not present

## 2022-06-06 DIAGNOSIS — E8770 Fluid overload, unspecified: Secondary | ICD-10-CM

## 2022-06-06 DIAGNOSIS — Z8679 Personal history of other diseases of the circulatory system: Secondary | ICD-10-CM

## 2022-06-06 DIAGNOSIS — N186 End stage renal disease: Secondary | ICD-10-CM | POA: Diagnosis present

## 2022-06-06 LAB — COMPREHENSIVE METABOLIC PANEL WITH GFR
ALT: 11 U/L (ref 0–44)
AST: 21 U/L (ref 15–41)
Albumin: 2.8 g/dL — ABNORMAL LOW (ref 3.5–5.0)
Alkaline Phosphatase: 114 U/L (ref 38–126)
Anion gap: 10 (ref 5–15)
BUN: 25 mg/dL — ABNORMAL HIGH (ref 8–23)
CO2: 33 mmol/L — ABNORMAL HIGH (ref 22–32)
Calcium: 8.5 mg/dL — ABNORMAL LOW (ref 8.9–10.3)
Chloride: 100 mmol/L (ref 98–111)
Creatinine, Ser: 6.46 mg/dL — ABNORMAL HIGH (ref 0.61–1.24)
GFR, Estimated: 8 mL/min — ABNORMAL LOW
Glucose, Bld: 113 mg/dL — ABNORMAL HIGH (ref 70–99)
Potassium: 3.7 mmol/L (ref 3.5–5.1)
Sodium: 143 mmol/L (ref 135–145)
Total Bilirubin: 0.5 mg/dL (ref 0.3–1.2)
Total Protein: 5.8 g/dL — ABNORMAL LOW (ref 6.5–8.1)

## 2022-06-06 LAB — CBC WITH DIFFERENTIAL/PLATELET
Abs Immature Granulocytes: 0.02 K/uL (ref 0.00–0.07)
Basophils Absolute: 0.1 K/uL (ref 0.0–0.1)
Basophils Relative: 1 %
Eosinophils Absolute: 0.2 K/uL (ref 0.0–0.5)
Eosinophils Relative: 3 %
HCT: 29.4 % — ABNORMAL LOW (ref 39.0–52.0)
Hemoglobin: 9.2 g/dL — ABNORMAL LOW (ref 13.0–17.0)
Immature Granulocytes: 0 %
Lymphocytes Relative: 22 %
Lymphs Abs: 1.4 K/uL (ref 0.7–4.0)
MCH: 32.9 pg (ref 26.0–34.0)
MCHC: 31.3 g/dL (ref 30.0–36.0)
MCV: 105 fL — ABNORMAL HIGH (ref 80.0–100.0)
Monocytes Absolute: 0.6 K/uL (ref 0.1–1.0)
Monocytes Relative: 10 %
Neutro Abs: 4 K/uL (ref 1.7–7.7)
Neutrophils Relative %: 64 %
Platelets: 274 K/uL (ref 150–400)
RBC: 2.8 MIL/uL — ABNORMAL LOW (ref 4.22–5.81)
RDW: 17.8 % — ABNORMAL HIGH (ref 11.5–15.5)
WBC: 6.3 K/uL (ref 4.0–10.5)
nRBC: 0 % (ref 0.0–0.2)

## 2022-06-06 LAB — TROPONIN I (HIGH SENSITIVITY)
Troponin I (High Sensitivity): 526 ng/L (ref <18)
Troponin I (High Sensitivity): 643 ng/L (ref <18)

## 2022-06-06 LAB — FRUCTOSAMINE: Fructosamine: 340 umol/L — ABNORMAL HIGH (ref 0–285)

## 2022-06-06 LAB — BRAIN NATRIURETIC PEPTIDE: B Natriuretic Peptide: 3250.3 pg/mL — ABNORMAL HIGH (ref 0.0–100.0)

## 2022-06-06 LAB — CBG MONITORING, ED: Glucose-Capillary: 162 mg/dL — ABNORMAL HIGH (ref 70–99)

## 2022-06-06 LAB — LIPASE, BLOOD: Lipase: 46 U/L (ref 11–51)

## 2022-06-06 MED ORDER — INSULIN ASPART 100 UNIT/ML IJ SOLN: 0.0000 [IU] | Freq: Every day | INTRAMUSCULAR | Status: DC

## 2022-06-06 MED ORDER — ACETAMINOPHEN 325 MG PO TABS
650.0000 mg | ORAL_TABLET | Freq: Four times a day (QID) | ORAL | Status: DC | PRN
  Administered 2022-06-09: 650 mg via ORAL
  Filled 2022-06-06: qty 2

## 2022-06-06 MED ORDER — SEVELAMER CARBONATE 800 MG PO TABS: 1600.0000 mg | ORAL_TABLET | ORAL | Status: DC

## 2022-06-06 MED ORDER — ASPIRIN 81 MG PO CHEW
324.0000 mg | CHEWABLE_TABLET | Freq: Once | ORAL | Status: AC
  Administered 2022-06-06: 324 mg via ORAL
  Filled 2022-06-06: qty 4

## 2022-06-06 MED ORDER — MELATONIN 3 MG PO TABS
3.0000 mg | ORAL_TABLET | Freq: Once | ORAL | Status: AC
  Administered 2022-06-06: 3 mg via ORAL
  Filled 2022-06-06: qty 1

## 2022-06-06 MED ORDER — ACETAMINOPHEN 650 MG RE SUPP: 650.0000 mg | Freq: Four times a day (QID) | RECTAL | Status: DC | PRN

## 2022-06-06 MED ORDER — SEVELAMER CARBONATE 800 MG PO TABS
2400.0000 mg | ORAL_TABLET | Freq: Three times a day (TID) | ORAL | Status: DC
  Administered 2022-06-07 – 2022-06-10 (×8): 2400 mg via ORAL
  Filled 2022-06-06 (×10): qty 3

## 2022-06-06 MED ORDER — PANTOPRAZOLE SODIUM 40 MG PO TBEC
40.0000 mg | DELAYED_RELEASE_TABLET | Freq: Every day | ORAL | Status: DC
  Administered 2022-06-07 – 2022-06-10 (×4): 40 mg via ORAL
  Filled 2022-06-06 (×4): qty 1

## 2022-06-06 MED ORDER — QUETIAPINE FUMARATE 25 MG PO TABS
25.0000 mg | ORAL_TABLET | Freq: Once | ORAL | Status: AC
  Administered 2022-06-06: 25 mg via ORAL
  Filled 2022-06-06: qty 1

## 2022-06-06 MED ORDER — QUETIAPINE FUMARATE 25 MG PO TABS
25.0000 mg | ORAL_TABLET | Freq: Every day | ORAL | Status: DC
  Administered 2022-06-07 – 2022-06-09 (×3): 25 mg via ORAL
  Filled 2022-06-06 (×3): qty 1

## 2022-06-06 MED ORDER — MEMANTINE HCL 10 MG PO TABS
10.0000 mg | ORAL_TABLET | Freq: Two times a day (BID) | ORAL | Status: DC
  Administered 2022-06-06 – 2022-06-10 (×8): 10 mg via ORAL
  Filled 2022-06-06 (×9): qty 1

## 2022-06-06 MED ORDER — DONEPEZIL HCL 5 MG PO TABS
10.0000 mg | ORAL_TABLET | Freq: Every day | ORAL | Status: DC
  Administered 2022-06-06 – 2022-06-09 (×4): 10 mg via ORAL
  Filled 2022-06-06 (×2): qty 1
  Filled 2022-06-06: qty 2
  Filled 2022-06-06: qty 1
  Filled 2022-06-06 (×2): qty 2

## 2022-06-06 MED ORDER — SEVELAMER CARBONATE 800 MG PO TABS
1600.0000 mg | ORAL_TABLET | ORAL | Status: DC
  Administered 2022-06-07 – 2022-06-09 (×5): 1600 mg via ORAL
  Filled 2022-06-06 (×2): qty 2

## 2022-06-06 MED ORDER — ALLOPURINOL 100 MG PO TABS
100.0000 mg | ORAL_TABLET | Freq: Every day | ORAL | Status: DC
  Administered 2022-06-07 – 2022-06-10 (×4): 100 mg via ORAL
  Filled 2022-06-06 (×5): qty 1

## 2022-06-06 MED ORDER — LEVOTHYROXINE SODIUM 50 MCG PO TABS
50.0000 ug | ORAL_TABLET | Freq: Every day | ORAL | Status: DC
  Administered 2022-06-07 – 2022-06-10 (×4): 50 ug via ORAL
  Filled 2022-06-06: qty 1
  Filled 2022-06-06: qty 2
  Filled 2022-06-06: qty 1

## 2022-06-06 MED ORDER — SIMVASTATIN 20 MG PO TABS
20.0000 mg | ORAL_TABLET | Freq: Every day | ORAL | Status: DC
  Administered 2022-06-06 – 2022-06-07 (×2): 20 mg via ORAL
  Filled 2022-06-06 (×2): qty 1

## 2022-06-06 MED ORDER — NEPRO/CARBSTEADY PO LIQD
237.0000 mL | Freq: Every morning | ORAL | Status: DC
  Administered 2022-06-08 – 2022-06-10 (×4): 237 mL via ORAL

## 2022-06-06 MED ORDER — IOHEXOL 350 MG/ML SOLN
100.0000 mL | Freq: Once | INTRAVENOUS | Status: AC | PRN
  Administered 2022-06-06: 100 mL via INTRAVENOUS

## 2022-06-06 MED ORDER — INSULIN ASPART 100 UNIT/ML IJ SOLN: 0.0000 [IU] | Freq: Three times a day (TID) | INTRAMUSCULAR | Status: DC

## 2022-06-06 NOTE — ED Notes (Addendum)
Critical troponin of 526 reported to [place] PA via secure chat

## 2022-06-06 NOTE — ED Provider Notes (Signed)
[facility] EMERGENCY DEPARTMENT Provider Note   CSN: [id number] Arrival date & time: 06/06/22  1604     History  Chief Complaint  Patient presents with   Abdominal Pain    ADRAIN NESBIT is a 81 y.o. male with a past medical history of Alzheimer's dementia who presents via EMS for chest pain.  Patient was seen yesterday for abdominal pain.  He had no imaging done at that time.  Patient was at his gastroenterologist today for follow-up when he began having heart rate between 135 and 140 and was complaining of severe chest pain.  History is gathered by phone call with the patient's wife and by review of EMR and outpatient GI notes by nurse practitioner earlier today.  Nurse practitioner note suggests that they are concern for potential pulmonary embolus and he needs immediate transport.  The patient's wife states that they were also concerned that he did not get imaging of his abdomen yesterday. The past medical history of end-stage renal disease on hemodialysis.  Unsure of patient's last dialysis session as he is unable to give history however clinically he does not appear to be short of breath or volume overloaded.  Patient denies any chest pain at all.  He does not remember having chest pain earlier.  He is complaining of pain in his left upper quadrant of the abdomen.   Abdominal Pain      Home Medications Prior to Admission medications   Medication Sig Start Date End Date Taking? Authorizing Provider  acetaminophen (TYLENOL) 325 MG tablet Take 1-2 tablets (325-650 mg total) by mouth every 4 (four) hours as needed for mild pain. 08/01/18   Love, Ivan Anchors, PA-C  albuterol (VENTOLIN HFA) 108 (90 Base) MCG/ACT inhaler Inhale 1 puff into the lungs daily as needed for wheezing or shortness of breath.    [provider]  allopurinol (ZYLOPRIM) 100 MG tablet Take 100 mg by mouth daily.    [provider]  Blood Glucose Monitoring Suppl (FREESTYLE FREEDOM  LITE) w/Device KIT Use to check blood sugar 2 times per day dx code E11.65 10/21/18   Elayne Snare, MD  camphor-menthol [facility]) lotion Apply 1 application topically 4 (four) times daily as needed for itching.     [provider]  Carboxymethylcellulose Sodium (THERATEARS) 0.25 % SOLN Place 1 drop into both eyes daily as needed (for dryness).    [provider]  cetirizine (ZYRTEC) 10 MG tablet Take 10 mg by mouth daily as needed for allergies.    [provider]  cyanocobalamin (,VITAMIN B-12,) 1000 MCG/ML injection Inject 1 mL (1,000 mcg total) into the muscle every 30 (thirty) days. 04/13/22   Elby Showers, MD  dicyclomine (BENTYL) 10 MG capsule Take 1 capsule (10 mg total) by mouth every 8 (eight) hours as needed for spasms. 12/12/21   Cirigliano, Vito V, DO  donepezil (ARICEPT) 10 MG tablet Take 1 tablet (10 mg total) by mouth at bedtime. 09/26/21   Melvenia Beam, MD  doxycycline (VIBRAMYCIN) 100 MG capsule Take 1 capsule (100 mg total) by mouth 2 (two) times daily. 04/10/22   Nelta Numbers, MD  fluticasone (FLONASE) 50 MCG/ACT nasal spray Place 1 spray into both nostrils daily. Patient taking differently: Place 1 spray into both nostrils daily as needed for allergies. 10/08/14   Mikhail, Velta Addison, DO  FORTEO 600 MCG/2.4ML SOPN INJECT 20 MCG UNDER THE SKIN DAILY (DISCARD 28 DAYS AFTER INITIAL USE) 05/14/22   Elayne Snare, MD  glucose blood (FREESTYLE LITE) test strip USE AS DIRECTED THREE TIMES DAILY 02/16/22   Elayne Snare, MD  hydrocortisone 2.5 % cream Apply 1 application topically 3 (three) times daily as needed (skin irritation (legs & arms)).  01/13/19   [provider]  Lancets (FREESTYLE) lancets Use as instructed to check blood sugar 2 times per day dx code E11.65 02/22/16   Elayne Snare, MD  levothyroxine (SYNTHROID, LEVOTHROID) 50 MCG tablet Take 1 tablet (50 mcg total) by mouth daily. Patient taking differently: Take 50 mcg by mouth daily before breakfast.  08/06/14   Baxley, Cresenciano Lick, MD  lidocaine (LIDODERM) 5 % Place 1 patch onto the skin daily as needed (back pain). 11/12/19   [provider]  memantine (NAMENDA) 10 MG tablet Take 1 tablet (10 mg total) by mouth 2 (two) times daily. 09/26/21   Melvenia Beam, MD  midodrine (PROAMATINE) 10 MG tablet Take one pill prior to hemodialysis on MWF Patient taking differently: Take 5 mg by mouth See admin instructions. Take one tablet by mouth prior to hemodialysis on MWF 08/13/18   Love, Ivan Anchors, PA-C  Nutritional Supplements (FEEDING SUPPLEMENT, NEPRO CARB STEADY,) LIQD Take 237 mLs by mouth daily.    [provider]  omeprazole (PRILOSEC) 40 MG capsule Take 1 capsule (40 mg total) by mouth daily. 03/13/18   Esterwood, Amy S, PA-C  QUEtiapine (SEROQUEL) 25 MG tablet Take 1 tablet (25 mg total) by mouth at bedtime. 05/22/22   Elby Showers, MD  repaglinide (PRANDIN) 0.5 MG tablet TAKE 1 TABLET(0.5 MG) BY MOUTH TWICE DAILY BEFORE A MEAL 05/29/22   Elayne Snare, MD  sevelamer carbonate (RENVELA) 800 MG tablet Take 1,600-2,400 mg by mouth See admin instructions. Take 2,400 mg by mouth three times a day with meals and 1,600 mg with snacks per spouse    [provider]  simvastatin (ZOCOR) 20 MG tablet Take 20 mg by mouth at bedtime.    [provider]  Syringe/Needle, Disp, (SYRINGE 3CC/25GX1") 25G X 1" 3 ML MISC 1 each by Does not apply route every 30 (thirty) days. 04/13/22   Elby Showers, MD  TRADJENTA 5 MG TABS tablet Take 1 tablet (5 mg total) by mouth daily. 11/16/21   Elayne Snare, MD      Allergies    Ambien [zolpidem tartrate] and Penicillins    Review of Systems   Review of Systems  Gastrointestinal:  Positive for abdominal pain.    Physical Exam Updated Vital Signs There were no vitals taken for this visit. Physical Exam Vitals and nursing note reviewed.  Constitutional:      General: He is not in acute distress.    Appearance: He is well-developed. He is  not diaphoretic.     Comments: Sallow skin Dialysis access noted in the right upper extremity with palpable thrill.  Is a bandage in place  HENT:     Head: Normocephalic and atraumatic.  Eyes:     General: No scleral icterus.    Conjunctiva/sclera: Conjunctivae normal.  Cardiovascular:     Rate and Rhythm: Normal rate and regular rhythm.     Heart sounds: Normal heart sounds.  Pulmonary:     Effort: Pulmonary effort is normal. No respiratory distress.     Breath sounds: Normal breath sounds.  Abdominal:     Palpations: Abdomen is soft.     Tenderness: There is no abdominal tenderness.  Musculoskeletal:     Cervical back: Normal range of motion and  neck supple.  Skin:    General: Skin is warm and dry.  Neurological:     Mental Status: He is alert.  Psychiatric:        Behavior: Behavior normal.     ED Results / Procedures / Treatments   Labs (all labs ordered are listed, but only abnormal results are displayed) Labs Reviewed - No data to display  EKG None  Radiology DG Abd 2 Views  Result Date: 06/04/2022 CLINICAL DATA:  Abdominal pain EXAM: ABDOMEN - 2 VIEW COMPARISON:  01/27/2007 FINDINGS: Bowel gas pattern is nonspecific. There is presence of gas in few slightly dilated small bowel loops. Stomach is not distended. Gas and stool are present in colon. There is no fecal impaction in rectum. There is no pneumoperitoneum. Extensive arterial calcifications are noted. There is vertebroplasty in L3 vertebra. There is decrease in height of multiple lumbar vertebral bodies. There is previous internal fixation in right femur. Surgical clips are seen in the para-aortic region and left inguinal region. IMPRESSION: Presence of gas in few slightly dilated small bowel loops may suggest ileus. There is no definite evidence of intestinal obstruction or pneumoperitoneum. Other findings as described in the body of the report. Electronically Signed   By: Elmer Picker M.D.   On: 06/04/2022  17:30    Procedures Procedures    Medications Ordered in ED Medications - No data to display  ED Course/ Medical Decision Making/ A&P Clinical Course as of 06/07/22 0925  Wed Jun 06, 2022  1741 Hemoglobin(!): 9.2 Hemoglobin is stable [AH]  2027 Repeat troponin is continuing to have mildly increased.  He did receive aspirin.  Repeat EKG has no changes from initial.  Patient will be admitted for further troponin trending and cardiac monitoring. [VK]  2117 I spoke with Dr. Marcelle Smiling of cardiology who recommended to not start heparin at this time as the patient has a history of chronic subdurals. They recommended echo  [VK]    Clinical Course User Index [AH] Margarita Mail, PA-C [VK] Ernesto Rutherford Norman Herrlich, DO                           Medical Decision Making -year-old male presents to the emergency department by EMS for chest pain.  First troponin has returned and is elevated.  Cardiac monitoring currently shows sinus rhythm.  Initial EKG does not show ischemic abnormalities.  Remainder of patient's work-up is currently pending.  Signout given to Dr. Ernesto Rutherford at shift handoff  Amount and/or Complexity of Data Reviewed Labs: ordered. Decision-making details documented in ED Course. Radiology: ordered.  Risk OTC drugs. Prescription drug management. Decision regarding hospitalization.           Final Clinical Impression(s) / ED Diagnoses Final diagnoses:  None    Rx / DC Orders ED Discharge Orders     None         Margarita Mail, PA-C 06/07/22 0930    Kneller, Bruin K, DO 06/07/22 1501

## 2022-06-06 NOTE — ED Triage Notes (Signed)
Per ems at baseline patient is a&o x1 due to dementia. Per ems patient was discharged here not long ago and seen at the GI clinic. GI clinic called out to EMS to report that the patient is having chest pain radiating to the right arm. Per EMS VSS. Per EMS patient has no complaints of CP. Per EMS patients wife takes care of him at home and states that there are times where he says "yes" to everything due to his dementia

## 2022-06-06 NOTE — ED Triage Notes (Signed)
Per EMS patient is having left lower abdominal pain. CBG 133. Patient ambulates with assistance/walker

## 2022-06-06 NOTE — ED Notes (Signed)
Per EMS wife was at bedside at [facility].

## 2022-06-06 NOTE — Progress Notes (Signed)
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   He went to dialysis Monday 06/04/2022 and his Hg level was 8.6 down from 10.5 on 05/17/2022 so he was sent to the ED for further evaluation. At that time, he reported having left sided abdominal pain. FOBT was negative. He had mild left abdominal tenderness on exam.  He was hemodynamically stable.  An abdominal xray showed presence of gas in a few slightly dilated bowel loops suggestive of a possible ileus. He did not fit criteria for admission and he was discharged home.   He presents toda for further evaluation regarding anemia with concerns of internal bleeding. He complains of having LLQ pain x 4 days which his wife reports is worse after eating. Decreased po intake for the past week.  Progressive fatigue for the past week.  He went to dialysis today and he completed his dialysis session without any difficulty. However, after he left dialysis he developed left chest pain without dizziness or shortness of breath.  He is currently having left chest pain at this time.  He continues to have LLQ pain which she stated was not severe.  He is passing brown formed stools daily as reported by his wife, his last BM was earlier today.  Over the past week, he is passing 2 bowel movements daily.  No diarrhea.  No rectal bleeding or black stools.  History of recurrent GI bleeds requiring hospital admission and numerous endoscopic procedures, refer to summary below.  His most recent small bowel enteroscopy was 11/05/2021 which was normal, no AVMs noted at that time.  He is not on any blood  thinners.  He complains of left chest pain at this time.  Heart rate 141 b/min with BP 126/58.  Repeat heart rate 135 b/m.  Supervising physician Dr. Loletha Carrow was consulted and examined the patient at this time.  Due to the patient's age, multiple comorbidities in setting of left chest pain with significant tachycardia patient was sent to [facility] ED via ambulance for further cardiac evaluation and for CTAP to rule out diverticulitis.  Past Medical History:  Diagnosis Date   Allergy    Alzheimer's disease ([place]) 01/19/2021   Anemia    Arthritis    Cataract    bil cateracts removed   Coronary artery disease    Dementia arising in the senium and presenium ([place])    Diabetes mellitus    Type 2   Diverticulitis    ED (erectile dysfunction)    Elevated homocysteine    ESRD (end stage renal disease) on dialysis ([place]) 03/2015   M-W-F dialysis   GERD (gastroesophageal reflux disease)    pepto    Gout    Hiatal hernia    Hyperlipidemia    Hypertension    Hypothyroidism    PVD (peripheral vascular disease) ([place])    has plastic aorta   Renal insufficiency    Sleep apnea    does not wear c-pap   Past Surgical History:  Procedure Laterality Date   ANGIOPLASTY Right 01/26/2021   Procedure: ANGIOPLASTY RIGHT SUBCLAVIAN VEIN;  Surgeon: Serafina Mitchell, MD;  Location: [place];  Service: Vascular;  Laterality: Right;   aortobifemoral bypass     AV FISTULA PLACEMENT Left 12/01/2013   Procedure: ARTERIOVENOUS (AV) FISTULA CREATION- LEFT BRACHIOCEPHALIC;  Surgeon: Angelia Mould, MD;  Location: [place];  Service: Vascular;  Laterality: Left;   [place] Right 07/27/2014   Procedure: [place];  Surgeon: Angelia Mould, MD;  Location: [place];  Service: Vascular;  Laterality: Right;   BRAIN SURGERY  07/22/2018   BREAST SURGERY     left - granulomatous mastitis   BURR HOLE Bilateral 07/22/2018   Procedure: BILATERAL BURR HOLES;  Surgeon:  Kristeen Miss, MD;  Location: [place];  Service: Neurosurgery;  Laterality: Bilateral;   COLONOSCOPY     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 ([place] [facility])  1992   ENTEROSCOPY N/A 06/03/2018   Procedure: ENTEROSCOPY;  Surgeon: Lavena Bullion, DO;  Location: [facility] ENDOSCOPY;  Service: Gastroenterology;  Laterality: N/A;   ENTEROSCOPY N/A 11/20/2018   Procedure: ENTEROSCOPY;  Surgeon: Rush Landmark Telford Nab., MD;  Location: [place];  Service: Gastroenterology;  Laterality: N/A;   ENTEROSCOPY N/A 12/03/2019   Procedure: ENTEROSCOPY;  Surgeon: Lavena Bullion, DO;  Location: [facility] ENDOSCOPY;  Service: Gastroenterology;  Laterality: N/A;  push enteroscopy   ENTEROSCOPY N/A 07/04/2021   Procedure: ENTEROSCOPY;  Surgeon: Sharyn Creamer, MD;  Location: [facility] ENDOSCOPY;  Service: Gastroenterology;  Laterality: N/A;   ENTEROSCOPY N/A 11/05/2021   Procedure: ENTEROSCOPY;  Surgeon: Daryel November, MD;  Location: [facility] ENDOSCOPY;  Service: Gastroenterology;  Laterality: N/A;   EYE SURGERY Bilateral    cataracts   FISTULOGRAM Right 01/26/2021   Procedure: FISTULOGRAM RIGHT;  Surgeon: Serafina Mitchell, MD;  Location: [facility] OR;  Service: Vascular;  Laterality: Right;   HEMODIALYSIS INPATIENT  01/17/2018       HOT HEMOSTASIS N/A 06/03/2018   Procedure: HOT HEMOSTASIS (ARGON PLASMA COAGULATION/BICAP);  Surgeon: Lavena Bullion, DO;  Location: [facility] ENDOSCOPY;  Service: Gastroenterology;  Laterality: N/A;   HOT HEMOSTASIS N/A 11/20/2018   Procedure: HOT HEMOSTASIS (ARGON PLASMA COAGULATION/BICAP);  Surgeon: Irving Copas., MD;  Location: [place];  Service: Gastroenterology;  Laterality: N/A;   HOT HEMOSTASIS N/A 12/03/2019   Procedure: HOT HEMOSTASIS (ARGON PLASMA COAGULATION/BICAP);  Surgeon: Lavena Bullion, DO;  Location: [facility] ENDOSCOPY;  Service: Gastroenterology;  Laterality: N/A;   INTRAMEDULLARY (IM) NAIL INTERTROCHANTERIC Right 11/08/2020   Procedure: INTRAMEDULLARY (IM) NAIL INTERTROCHANTRIC;  Surgeon:  Erle Crocker, MD;  Location: [place];  Service: Orthopedics;  Laterality: Right;   REVISION OF ARTERIOVENOUS GORETEX GRAFT Right 09/01/2019   Procedure: REVISION OF ARTERIOVENOUS FISTULA RIGHT ARM;  Surgeon: Serafina Mitchell, MD;  Location: [place];  Service: Vascular;  Laterality: Right;   REVISON OF ARTERIOVENOUS FISTULA Left 02/09/2014   Procedure: REVISON OF LEFT ARTERIOVENOUS FISTULA - RESECTION OF RENDUNDANT VEIN;  Surgeon: Angelia Mould, MD;  Location: [place];  Service: Vascular;  Laterality: Left;   REVISON OF ARTERIOVENOUS FISTULA Right 01/26/2021   Procedure: REVISON OF ARTERIOVENOUS FISTULA RIGHT;  Surgeon: Serafina Mitchell, MD;  Location: [place];  Service: Vascular;  Laterality: Right;   SBO with lysis adhesions     SHUNTOGRAM Left 04/19/2014   Procedure: FISTULOGRAM;  Surgeon: Angelia Mould, MD;  Location: [facility] CATH LAB;  Service: Cardiovascular;  Laterality: Left;   UNILATERAL UPPER EXTREMEITY ANGIOGRAM N/A 07/12/2014   Procedure: UNILATERAL UPPER Anselmo Rod;  Surgeon: Angelia Mould, MD;  Location: [facility] CATH LAB;  Service: Cardiovascular;  Laterality: N/A;  Latest Ref Rng & Units 06/05/2022    1:44 PM 06/04/2022    4:34 PM 05/17/2022   11:44 AM  CBC  WBC 4.0 - 10.5 K/uL 6.8  6.7  8.4   Hemoglobin 13.0 - 17.0 g/dL 9.2  8.6  10.5   Hematocrit 39.0 - 52.0 % 27.9  25.9  31.9   Platelets 150.0 - 400.0 K/uL 207.0  186  187        Latest Ref Rng & Units 06/05/2022   11:16 AM 06/04/2022    4:34 PM 05/17/2022   11:44 AM  CMP  Glucose 70 - 99 mg/dL 101  134  119   BUN 8 - 23 mg/dL  23  37   Creatinine 0.61 - 1.24 mg/dL  5.35  6.68   Sodium 135 - 145 mmol/L  139  143   Potassium 3.5 - 5.1 mmol/L  3.3  4.5   Chloride 98 - 111 mmol/L  94  94   CO2 22 - 32 mmol/L  30  29   Calcium 8.4 - 10.5 mg/dL 9.2  8.4  9.7   Total Protein 6.5 - 8.1 g/dL  6.3  6.7   Total Bilirubin 0.3 - 1.2 mg/dL  0.3  0.3   Alkaline Phos 38 - 126 U/L  126    AST 15 - 41 U/L   26  18   ALT 0 - 44 U/L  11  10      PAST GI PROCEDURES:  Small bowel endoscopy 11/05/2021: - Normal esophagus. - Small hiatal hernia. - Normal examined duodenum. - The examined portion of the jejunum was normal. - No specimens collected. - No AVMs noted, no evidence  Small bowel endoscopy 07/10/2021: - Normal esophagus. - A single non-bleeding angioectasia in the stomach. Treated with argon plasma coagulation (APC). - A single non-bleeding angioectasia in the duodenum. Treated with argon plasma coagulation (APC). 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 Treated with argon plasma coagulation (APC). - No specimens collected.   Small bowel enteroscopy 12/03/2019 by Dr. Bryan Lemma: -2 cm HH -Nonobstructing Schatzki's ring -Normal stomach -2 AVMs in D3/D4, 3 AVMs and proximal jejunum, all treated with APC -Tattoo seen jejunum from prior enteroscopy   Small bowel capsule endoscopy 03/17/2019:   1.  Complete study, adequate prep. 2.  No active bleeding/oozing. 3.  Few tiny red spots/possible tiny AVMs in the mid small bowel around 2 hours 30 minutes. 4.  Distal small bowel AVMs at 7 hours 13 minutes range.   Small bowel enteroscopy 11/20/2018 by Dr. Rush Landmark: - No gross lesions in esophagus. - A single spot with bleeding in the stomach. Treated with argon plasma coagulation (APC). - A single non-bleeding angioectasia in the stomach. Treated with argon plasma coagulation (APC). - Erythematous mucosa in the greater curvature. - No other gross lesions in the stomach. - Two spots with oozing in the duodenum. Treated with argon plasma coagulation (APC). - Normal mucosa was found in the entire examined duodenum otherwise. - Three non-bleeding angioectasias in the jejunum. Treated with argon plasma coagulation (APC). - Normal mucosa was found in the jejunum otherwise. Tattooed.   Small bowel enteroscopy 06/03/2018 done due to UGI bleed/melena/anemia by Dr. Bryan Lemma: -  The examined portion of the jejunum was normal. - Normal duodenal bulb, first portion of the duodenum, second portion of the duodenum, third portion of the duodenum and fourth portion of the duodenum. - Normal esophagus. - A single recently bleeding angioectasia in the stomach. Treated with  argon plasma coagulation (APC). - Normal gastric body, antrum and pylorus. - No specimens collected.   EGD 11/12/2017 done due to IDA by Dr. Silverio Decamp. - Benign-appearing esophageal stenosis. - Erythematous mucosa in the stomach. No H. Pylori.  - Normal examined duodenum.   Colonoscopy 11/12/2017 done due to IDA by Dr. Silverio Decamp: - The perianal and digital rectal examinations were normal. - A 9 mm tubulovillous polyp was found in the transverse colon. The polyp was sessile. The polyp was removed with a cold snare. Resection and retrieval were complete. - A 1 mm hyperplastic polyp was found in the recto-sigmoid colon. The polyp was sessile. The polyp was removed with a cold biopsy forceps. Resection and retrieval were complete. - A single localized non-bleeding erosion was found in the transverse colon. No stigmata of recent bleeding were seen. - A few small and large-mouthed diverticula were found in the sigmoid colon, descending colon, transverse colon, hepatic flexure and ascending colon. - Non-bleeding internal hemorrhoids were found during retroflexion. The hemorrhoids were small."   Current Outpatient Medications on File Prior to Visit  Medication Sig Dispense Refill   acetaminophen (TYLENOL) 325 MG tablet Take 1-2 tablets (325-650 mg total) by mouth every 4 (four) hours as needed for mild pain.     albuterol (VENTOLIN HFA) 108 (90 Base) MCG/ACT inhaler Inhale 1 puff into the lungs daily as needed for wheezing or shortness of breath.     allopurinol (ZYLOPRIM) 100 MG tablet Take 100 mg by mouth daily.     Blood Glucose Monitoring Suppl (FREESTYLE FREEDOM LITE) w/Device KIT Use to check blood sugar  2 times per day dx code E11.65 1 each 0   camphor-menthol (SARNA) lotion Apply 1 application topically 4 (four) times daily as needed for itching.      Carboxymethylcellulose Sodium (THERATEARS) 0.25 % SOLN Place 1 drop into both eyes daily as needed (for dryness).     cetirizine (ZYRTEC) 10 MG tablet Take 10 mg by mouth daily as needed for allergies.     cyanocobalamin (,VITAMIN B-12,) 1000 MCG/ML injection Inject 1 mL (1,000 mcg total) into the muscle every 30 (thirty) days. 10 mL 0   dicyclomine (BENTYL) 10 MG capsule Take 1 capsule (10 mg total) by mouth every 8 (eight) hours as needed for spasms. 60 capsule 3   donepezil (ARICEPT) 10 MG tablet Take 1 tablet (10 mg total) by mouth at bedtime. 90 tablet 0   doxycycline (VIBRAMYCIN) 100 MG capsule Take 1 capsule (100 mg total) by mouth 2 (two) times daily. 20 capsule 0   fluticasone (FLONASE) 50 MCG/ACT nasal spray Place 1 spray into both nostrils daily. (Patient taking differently: Place 1 spray into both nostrils daily as needed for allergies.) 16 g 2   FORTEO 600 MCG/2.4ML SOPN INJECT 20 MCG UNDER THE SKIN DAILY (DISCARD 28 DAYS AFTER INITIAL USE) 2.4 mL 2   glucose blood (FREESTYLE LITE) test strip USE AS DIRECTED THREE TIMES DAILY 300 strip 2   hydrocortisone 2.5 % cream Apply 1 application topically 3 (three) times daily as needed (skin irritation (legs & arms)).      levothyroxine (SYNTHROID, LEVOTHROID) 50 MCG tablet Take 1 tablet (50 mcg total) by mouth daily. (Patient taking differently: Take 50 mcg by mouth daily before breakfast.) 90 tablet 1   lidocaine (LIDODERM) 5 % Place 1 patch onto the skin daily as needed (back pain).     memantine (NAMENDA) 10 MG tablet Take 1 tablet (10 mg total) by mouth 2 (two)  times daily. 180 tablet 0   midodrine (PROAMATINE) 10 MG tablet Take one pill prior to hemodialysis on MWF (Patient taking differently: Take 5 mg by mouth See admin instructions. Take one tablet by mouth prior to hemodialysis on MWF)      Nutritional Supplements (FEEDING SUPPLEMENT, NEPRO CARB STEADY,) LIQD Take 237 mLs by mouth daily.     omeprazole (PRILOSEC) 40 MG capsule Take 1 capsule (40 mg total) by mouth daily. 30 capsule 0   QUEtiapine (SEROQUEL) 25 MG tablet Take 1 tablet (25 mg total) by mouth at bedtime. 90 tablet 0   repaglinide (PRANDIN) 0.5 MG tablet TAKE 1 TABLET(0.5 MG) BY MOUTH TWICE DAILY BEFORE A MEAL 60 tablet 2   sevelamer carbonate (RENVELA) 800 MG tablet Take 1,600-2,400 mg by mouth See admin instructions. Take 2,400 mg by mouth three times a day with meals and 1,600 mg with snacks per spouse     simvastatin (ZOCOR) 20 MG tablet Take 20 mg by mouth at bedtime.     Syringe/Needle, Disp, (SYRINGE 3CC/25GX1") 25G X 1" 3 ML MISC 1 each by Does not apply route every 30 (thirty) days. 50 each 0   TRADJENTA 5 MG TABS tablet Take 1 tablet (5 mg total) by mouth daily. 90 tablet 3   Lancets (FREESTYLE) lancets Use as instructed to check blood sugar 2 times per day dx code E11.65 100 each 3   No current facility-administered medications on file prior to visit.   Allergies  Allergen Reactions   Ambien [Zolpidem Tartrate] Other (See Comments)    Hallucinations and "felt crazy"    Penicillins Rash and Hives    Has patient had a PCN reaction causing immediate rash, facial/tongue/throat swelling, SOB or lightheadedness with hypotension: Yes Has patient had a PCN reaction causing severe rash involving mucus membranes or skin necrosis: Yes Has patient had a PCN reaction that required hospitalization: No Has patient had a PCN reaction occurring within the last 10 years: No If all of the above answers are "NO", then may proceed with Cephalosporin use.  Other reaction(s): HIVES   Current Medications, Allergies, Past Medical History, Past Surgical History, Family History and Social History were reviewed in [employer] record.   Review of Systems:   Constitutional: + Weight loss.  Respiratory:  Negative for shortness of breath.   Cardiovascular: Negative for chest pain, palpitations and leg swelling.  Gastrointestinal: See HPI.  Musculoskeletal: + Generalized muscle weakness. Neurological: Negative for dizziness, headaches or paresthesias.   Physical Exam: Pulse (!) 141   Ht 5' 7"  (1.702 m)   Wt 156 lb 9.6 oz (71 kg)   SpO2 98%   BMI 24.53 kg/m  BP (!) 126/58   Pulse (!) 135   Ht 5' 7"  (1.702 m)   Wt 156 lb 9.6 oz (71 kg)   SpO2 98%   BMI 24.53 kg/m    Wt Readings from Last 3 Encounters:  06/06/22 156 lb 9.6 oz (71 kg)  05/22/22 119 lb (54 kg)  02/15/22 150 lb 3.2 oz (68.1 kg)    General: 81 year old male fatigued appearing in no acute distress. Head: Normocephalic and atraumatic. Eyes: No scleral icterus. Conjunctiva pink . Ears: Normal auditory acuity. Mouth: Upper dentures.  No ulcers or lesions.  Lungs: Clear throughout to auscultation. Heart: Tachycardic, heart rhythm intermittently irregular.  No murmur. Abdomen: Soft, nondistended.  Mild LLQ tenderness without rebound or guarding.  No masses or hepatomegaly. Normal bowel sounds x 4 quadrants.  Rectal: Deferred.  Musculoskeletal: Symmetrical with no gross deformities. Extremities: No edema. Neurological: Alert oriented x 4. No focal deficits.  Psychological: Alert and cooperative. Normal mood and affect  Assessment and Recommendations:  23) 81 year old male with acute on chronic anemia secondary to recurrent GI bleed from gastric and small bowel AVMs and CKD.  Hemoglobin 10.5 -> 8.6. FOBT negative on 06/04/2022. Hg 9.2 on 06/05/2022.  He is not actively GI bleeding at this time with a hemoglobin level up to 9.2, negative FOBT and he passed a brown stool earlier today.  2) Left chest pain, tachycardia. HR 135 - 140 b/min. -Patient sent to [facility] ED via ambulance for cardiac evaluation  3) LLQ pain -As noted above, patient sent to the ED with left chest pain but he is also experiencing LLQ pain  for the past 4 days.  CTAP to be done in the ED to rule out diverticulitis  4) CKD on HD.  Patient had hemodialysis session earlier today.  5) History of dementia   Further follow-up to be determined after ED evaluation completed

## 2022-06-06 NOTE — Consult Note (Signed)
Cardiology Consultation:   Patient ID: [name] MRN: [id number]; DOB: January 17, 1941  Admit date: 06/06/2022 Date of Consult: 06/06/2022  Primary Care Provider: Elby Showers, MD Primary Cardiologist: Minus Breeding, MD  Primary Electrophysiologist:  None    Patient Profile:   [name] is a 81 y.o. 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  History of Present Illness:   [name] is a 81 y.o. 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  At the time he was found to be more anemic but further work-up was unremarkable so he was discharged with plan to follow-up with his PCP.  He was seen at GI today for the abdominal pain and during the visit he was noted to be tachycardic and reportedly complained of chest pain.  He was transferred to the emergency department for further evaluation.  In the ED, he was noted to be in normal sinus rhythm with heart rates in the 70s to 80s.  ECG was largely unremarkable except for nonspecific T wave changes.  Hemoglobin 9.2, troponin 526 10/30/1941, BMP 3250.  On arrival to the emergency department he was given aspirin.    On my exam, the patient is resting comfortably.  Unable to give a detailed history given his dementia.  Denies any active chest pain at this time.  Past Medical History:  Diagnosis Date   Allergy    Alzheimer's disease ([place]) 01/19/2021   Anemia    Arthritis    Cataract    bil cateracts removed   Coronary artery disease     Dementia arising in the senium and presenium ([place])    Diabetes mellitus    Type 2   Diverticulitis    ED (erectile dysfunction)    Elevated homocysteine    ESRD (end stage renal disease) on dialysis ([place]) 03/2015   M-W-F dialysis   GERD (gastroesophageal reflux disease)    pepto    Gout    Hiatal hernia    Hyperlipidemia    Hypertension    Hypothyroidism    PVD (peripheral vascular disease) ([place])    has plastic aorta   Renal insufficiency    Sleep apnea    does not wear c-pap    Past Surgical History:  Procedure Laterality Date   ANGIOPLASTY Right 01/26/2021   Procedure: ANGIOPLASTY RIGHT SUBCLAVIAN VEIN;  Surgeon: Serafina Mitchell, MD;  Location: [place];  Service: Vascular;  Laterality: Right;   aortobifemoral bypass     AV FISTULA PLACEMENT Left 12/01/2013   Procedure: ARTERIOVENOUS (AV) FISTULA CREATION- LEFT BRACHIOCEPHALIC;  Surgeon: Angelia Mould, MD;  Location: [place];  Service: Vascular;  Laterality: Left;   [place] Right 07/27/2014   Procedure: BASCILIC VEIN TRANSPOSITION;  Surgeon: Angelia Mould, MD;  Location: [place];  Service: Vascular;  Laterality: Right;   BRAIN SURGERY  07/22/2018   BREAST SURGERY     left - granulomatous mastitis   BURR HOLE Bilateral 07/22/2018   Procedure: BILATERAL BURR  HOLES;  Surgeon: Kristeen Miss, MD;  Location: [place];  Service: Neurosurgery;  Laterality: Bilateral;   COLONOSCOPY     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 ([place] [facility])  1992   ENTEROSCOPY N/A 06/03/2018   Procedure: ENTEROSCOPY;  Surgeon: Lavena Bullion, DO;  Location: [facility];  Service: Gastroenterology;  Laterality: N/A;   ENTEROSCOPY N/A 11/20/2018   Procedure: ENTEROSCOPY;  Surgeon: Rush Landmark Telford Nab., MD;  Location: [place];  Service: Gastroenterology;  Laterality: N/A;   ENTEROSCOPY N/A 12/03/2019   Procedure: ENTEROSCOPY;  Surgeon: Lavena Bullion, DO;  Location: [facility];  Service: Gastroenterology;  Laterality:  N/A;  push enteroscopy   ENTEROSCOPY N/A 07/04/2021   Procedure: ENTEROSCOPY;  Surgeon: Sharyn Creamer, MD;  Location: [facility] ENDOSCOPY;  Service: Gastroenterology;  Laterality: N/A;   ENTEROSCOPY N/A 11/05/2021   Procedure: ENTEROSCOPY;  Surgeon: Daryel November, MD;  Location: [facility] ENDOSCOPY;  Service: Gastroenterology;  Laterality: N/A;   EYE SURGERY Bilateral    cataracts   FISTULOGRAM Right 01/26/2021   Procedure: FISTULOGRAM RIGHT;  Surgeon: Serafina Mitchell, MD;  Location: [facility] OR;  Service: Vascular;  Laterality: Right;   HEMODIALYSIS INPATIENT  01/17/2018       HOT HEMOSTASIS N/A 06/03/2018   Procedure: HOT HEMOSTASIS (ARGON PLASMA COAGULATION/BICAP);  Surgeon: Lavena Bullion, DO;  Location: [facility] ENDOSCOPY;  Service: Gastroenterology;  Laterality: N/A;   HOT HEMOSTASIS N/A 11/20/2018   Procedure: HOT HEMOSTASIS (ARGON PLASMA COAGULATION/BICAP);  Surgeon: Irving Copas., MD;  Location: [place];  Service: Gastroenterology;  Laterality: N/A;   HOT HEMOSTASIS N/A 12/03/2019   Procedure: HOT HEMOSTASIS (ARGON PLASMA COAGULATION/BICAP);  Surgeon: Lavena Bullion, DO;  Location: [facility] ENDOS[facility];  Service: Gastroenterology;  Laterality: N/A;   INTRAMEDULLARY (IM) NAIL INTERTROCHANTERIC Right 11/08/2020   Procedure: INTRAMEDULLARY (IM) NAIL INTERTROCHANTRIC;  Surgeon: Erle Crocker, MD;  Location: [place];  Service: Orthopedics;  Laterality: Right;   REVISION OF ARTERIOVENOUS GORETEX GRAFT Right 09/01/2019   Procedure: REVISION OF ARTERIOVENOUS FISTULA RIGHT ARM;  Surgeon: Serafina Mitchell, MD;  Location: [place];  Service: Vascular;  Laterality: Right;   REVISON OF ARTERIOVENOUS FISTULA Left 02/09/2014   Procedure: REVISON OF LEFT ARTERIOVENOUS FISTULA - RESECTION OF RENDUNDANT VEIN;  Surgeon: Angelia Mould, MD;  Location: [place];  Service: Vascular;  Laterality: Left;   REVISON OF ARTERIOVENOUS FISTULA Right 01/26/2021   Procedure: REVISON OF ARTERIOVENOUS FISTULA RIGHT;  Surgeon:  Serafina Mitchell, MD;  Location: [place];  Service: Vascular;  Laterality: Right;   SBO with lysis adhesions     SHUNTOGRAM Left 04/19/2014   Procedure: FISTULOGRAM;  Surgeon: Angelia Mould, MD;  Location: [facility] CATH LAB;  Service: Cardiovascular;  Laterality: Left;   UNILATERAL UPPER EXTREMEITY ANGIOGRAM N/A 07/12/2014   Procedure: UNILATERAL UPPER Anselmo Rod;  Surgeon: Angelia Mould, MD;  Location: [facility] CATH LAB;  Service: Cardiovascular;  Laterality: N/A;     Home Medications:  Prior to Admission medications   Medication Sig Start Date End Date Taking? Authorizing Provider  acetaminophen (TYLENOL) 325 MG tablet Take 1-2 tablets (325-650 mg total) by mouth every 4 (four) hours as needed for mild pain. Patient taking differently: Take 650 mg by mouth in the morning and at bedtime. 08/01/18  Yes Love, Ivan Anchors, PA-C  albuterol (VENTOLIN HFA) 108 (90 Base) MCG/ACT inhaler Inhale 1-2 puffs into the lungs daily as needed for wheezing or shortness of breath.   Yes [provider]  allopurinol (ZYLOPRIM) 100 MG  tablet Take 100 mg by mouth daily.   Yes [provider]  camphor-menthol Timoteo Ace) lotion Apply 1 application topically 4 (four) times daily as needed for itching.    Yes [provider]  Carboxymethylcellulose Sodium (THERATEARS) 0.25 % SOLN Place 1 drop into both eyes 3 (three) times daily as needed (for dryness).   Yes [provider]  cetirizine (ZYRTEC) 10 MG tablet Take 10 mg by mouth daily as needed for allergies.   Yes [provider]  cyanocobalamin (,VITAMIN B-12,) 1000 MCG/ML injection Inject 1 mL (1,000 mcg total) into the muscle every 30 (thirty) days. 04/13/22  Yes Baxley, Cresenciano Lick, MD  donepezil (ARICEPT) 10 MG tablet Take 1 tablet (10 mg total) by mouth at bedtime. 09/26/21  Yes Melvenia Beam, MD  fluticasone (FLONASE) 50 MCG/ACT nasal spray Place 1 spray into both nostrils daily. Patient taking differently: Place 2  sprays into both nostrils daily as needed for allergies. 10/08/14  Yes Mikhail, Maryann, DO  FORTEO 600 MCG/2.4ML SOPN INJECT 20 MCG UNDER THE SKIN DAILY (DISCARD 28 DAYS AFTER INITIAL USE) Patient taking differently: Inject 20 mcg into the skin at bedtime. (DISCARD 28 DAYS AFTER INITIAL USE) 05/14/22  Yes Elayne Snare, MD  hydrocortisone 2.5 % cream Apply 1 application topically 3 (three) times daily as needed (skin irritation (legs & arms)).  01/13/19  Yes [provider]  levothyroxine (SYNTHROID, LEVOTHROID) 50 MCG tablet Take 1 tablet (50 mcg total) by mouth daily. Patient taking differently: Take 50 mcg by mouth daily before breakfast. 08/06/14  Yes Baxley, Cresenciano Lick, MD  lidocaine (LIDODERM) 5 % Place 1 patch onto the skin daily as needed (back pain- after removal of former one). 11/12/19  Yes [provider]  memantine (NAMENDA) 10 MG tablet Take 1 tablet (10 mg total) by mouth 2 (two) times daily. 09/26/21  Yes Melvenia Beam, MD  midodrine (PROAMATINE) 10 MG tablet Take one pill prior to hemodialysis on MWF Patient taking differently: Take 10 mg by mouth See admin instructions. Take 10 mg by mouth prior to hemodialysis on Mon/Wed/Fri 08/13/18  Yes Love, Ivan Anchors, PA-C  Nutritional Supplements (FEEDING SUPPLEMENT, NEPRO CARB STEADY,) LIQD Take 237 mLs by mouth in the morning.   Yes [provider]  omeprazole (PRILOSEC) 40 MG capsule Take 1 capsule (40 mg total) by mouth daily. Patient taking differently: Take 40 mg by mouth daily before breakfast. 03/13/18  Yes Esterwood, Amy S, PA-C  QUEtiapine (SEROQUEL) 25 MG tablet Take 1 tablet (25 mg total) by mouth at bedtime. 05/22/22  Yes Baxley, Cresenciano Lick, MD  repaglinide (PRANDIN) 0.5 MG tablet TAKE 1 TABLET(0.5 MG) BY MOUTH TWICE DAILY BEFORE A MEAL Patient taking differently: Take 0.5 mg by mouth See admin instructions. Take 0.5 mg by mouth at 5 pm (AFTER dialysis) only on Mon/Wed/Fri 05/29/22  Yes Elayne Snare, MD  sevelamer  carbonate (RENVELA) 800 MG tablet Take 1,600-2,400 mg by mouth See admin instructions. Take 2,400 mg by mouth three times a day with meals and 1,600 mg with snacks   Yes [provider]  simvastatin (ZOCOR) 20 MG tablet Take 20 mg by mouth at bedtime.   Yes [provider]  TRADJENTA 5 MG TABS tablet Take 1 tablet (5 mg total) by mouth daily. 11/16/21  Yes Elayne Snare, MD  Blood Glucose Monitoring Suppl (FREESTYLE FREEDOM LITE) w/Device KIT Use to check blood sugar 2 times per day dx code E11.65 10/21/18   Elayne Snare, MD  dicyclomine (  BENTYL) 10 MG capsule Take 1 capsule (10 mg total) by mouth every 8 (eight) hours as needed for spasms. Patient not taking: Reported on 06/06/2022 12/12/21   Cirigliano, Luanna Salk V, DO  doxycycline (VIBRAMYCIN) 100 MG capsule Take 1 capsule (100 mg total) by mouth 2 (two) times daily. Patient not taking: Reported on 06/06/2022 04/10/22   Nelta Numbers, MD  glucose blood (FREESTYLE LITE) test strip USE AS DIRECTED THREE TIMES DAILY 02/16/22   Elayne Snare, MD  Lancets (FREESTYLE) lancets Use as instructed to check blood sugar 2 times per day dx code E11.65 02/22/16   Elayne Snare, MD  Syringe/Needle, Disp, (SYRINGE 3CC/25GX1") 25G X 1" 3 ML MISC 1 each by Does not apply route every 30 (thirty) days. 04/13/22   Elby Showers, MD    Inpatient Medications: Scheduled Meds:  Continuous Infusions:  PRN Meds:   Allergies:    Allergies  Allergen Reactions   Ambien [Zolpidem Tartrate] Other (See Comments)    Hallucinations and "felt crazy"    Penicillins Hives and Rash    Has patient had a PCN reaction causing immediate rash, facial/tongue/throat swelling, SOB or lightheadedness with hypotension: Yes Has patient had a PCN reaction causing severe rash involving mucus membranes or skin necrosis: Yes Has patient had a PCN reaction that required hospitalization: No Has patient had a PCN reaction occurring within the last 10 years: No If all of the above answers  are "NO", then may proceed with Cephalosporin use.     Social History:   Social History   Socioeconomic History   Marital status: Married    Spouse name: [name] Mood   Number of children: 1   Years of education: 16   Highest education level: Not on file  Occupational History   Occupation: Retired  Tobacco Use   Smoking status: Former    Types: Cigarettes    Quit date: 09/24/1994    Years since quitting: 27.7    Passive exposure: Never   Smokeless tobacco: Never  Vaping Use   Vaping Use: Never used  Substance and Sexual Activity   Alcohol use: No    Alcohol/week: 0.0 standard drinks of alcohol    Comment: Quit Oct. 1977 ("somewhat heavy")   Drug use: No   Sexual activity: Not on file  Other Topics Concern   Not on file  Social History Narrative   Lives at home with wife.   Caffeine use: Drinks no tea, "a little soda"   Drinks 1 cup coffee/week   Right handed   VETERAN   Social Determinants of Health   Financial Resource Strain: Not on file  Food Insecurity: No Food Insecurity (04/25/2022)   Hunger Vital Sign    Worried About Running Out of Food in the Last Year: Never true    Ran Out of Food in the Last Year: Never true  Transportation Needs: No Transportation Needs (04/25/2022)   PRAPARE - Hydrologist (Medical): No    Lack of Transportation (Non-Medical): No  Physical Activity: Insufficiently Active (07/30/2017)   Exercise Vital Sign    Days of Exercise per Week: 3 days    Minutes of Exercise per Session: 10 min  Stress: No Stress Concern Present (07/30/2017)   [place]    Feeling of Stress : Not at all  Social Connections: Not on file  Intimate Partner Violence: Not on file    Family History:    Family History  Problem Relation Age of Onset   Aneurysm Mother        brain   Heart disease Father    Stroke Father    Hypertension Father    Diabetes Father    Dementia  Neg Hx    Colon cancer Neg Hx    Esophageal cancer Neg Hx    Pancreatic cancer Neg Hx    Prostate cancer Neg Hx    Rectal cancer Neg Hx    Stomach cancer Neg Hx      Review of Systems: Limited given mental status  Physical Exam/Data:   Vitals:   06/06/22 2000 06/06/22 2015 06/06/22 2030 06/06/22 2115  BP: 115/78     Pulse: 71 69 70 69  Resp: 20 17 19 19   Temp: 98.6 F (37 C)     TempSrc:      SpO2: 97% 97% 97% 97%   No intake or output data in the 24 hours ending 06/06/22 2219 There were no vitals filed for this visit. There is no height or weight on file to calculate BMI.  General:  in no acute distress HEENT: normal Lymph: no adenopathy Neck: no JVD Endocrine:  No thryomegaly Vascular: No carotid bruits; FA pulses 2+ bilaterally without bruits  Cardiac:  normal S1, S2; RRR; no murmur  Lungs:  clear to auscultation bilaterally, no wheezing, rhonchi or rales  Abd: soft, nontender, no hepatomegaly  Ext: no edema Skin: warm and dry  Neuro:  AAO x 2 no focal deficits  Psych:  Normal affect   EKG:  The EKG was personally reviewed and demonstrates:  nonspecific t wave changes  Telemetry:  Telemetry was personally reviewed and demonstrates:  nsr   Relevant CV Studies: none  Laboratory Data:  Chemistry Recent Labs  Lab 06/04/22 1634 06/05/22 1116 06/06/22 1819  NA 139  --  143  K 3.3*  --  3.7  CL 94*  --  100  CO2 30  --  33*  GLUCOSE 134* 101* 113*  BUN 23  --  25*  CREATININE 5.35*  --  6.46*  CALCIUM 8.4* 9.2 8.5*  GFRNONAA 10*  --  8*  ANIONGAP 15  --  10    Recent Labs  Lab 06/04/22 1634 06/06/22 1819  PROT 6.3* 5.8*  ALBUMIN 3.0* 2.8*  AST 26 21  ALT 11 11  ALKPHOS 126 114  BILITOT 0.3 0.5   Hematology Recent Labs  Lab 06/04/22 1634 06/05/22 1344 06/06/22 1703  WBC 6.7 6.8 6.3  RBC 2.62* 2.80* 2.80*  HGB 8.6* 9.2* 9.2*  HCT 25.9* 27.9* 29.4*  MCV 98.9 99.5 105.0*  MCH 32.8  --  32.9  MCHC 33.2 33.0 31.3  RDW 17.1* 18.8* 17.8*   PLT 186 207.0 274   Cardiac EnzymesNo results for input(s): "TROPONINI" in the last 168 hours. No results for input(s): "TROPIPOC" in the last 168 hours.  BNP Recent Labs  Lab 06/06/22 2010  BNP 3,250.3*    DDimer No results for input(s): "DDIMER" in the last 168 hours.  Radiology/Studies:  CT Angio Chest PE W and/or Wo Contrast  Result Date: 06/06/2022 CLINICAL DATA:  Chest pain short of breath left upper quadrant abdominal pain EXAM: CT ANGIOGRAPHY CHEST, ABDOMEN AND PELVIS TECHNIQUE: Multidetector CT imaging through the chest, abdomen and pelvis was performed using the standard protocol during bolus administration of intravenous contrast. Multiplanar reconstructed images and MIPs were obtained and reviewed to evaluate the vascular anatomy. RADIATION DOSE REDUCTION: This exam was performed  according to the departmental dose-optimization program which includes automated exposure control, adjustment of the mA and/or kV according to patient size and/or use of iterative reconstruction technique. CONTRAST:  157m OMNIPAQUE IOHEXOL 350 MG/ML SOLN COMPARISON:  Radiograph 06/04/2022, CT abdomen pelvis 04/10/2022, 10/05/2021, CT chest 01/12/2021, CT chest 10/30/2017 FINDINGS: CTA CHEST FINDINGS Cardiovascular: Satisfactory opacification of the pulmonary arteries to the segmental level. No evidence of pulmonary embolism. Advanced aortic atherosclerosis. No aneurysm. Extensive coronary vascular calcification. Borderline cardiac size. No pericardial effusion. Mediastinum/Nodes: Midline trachea. No thyroid mass. No suspicious lymph nodes. Esophagus within normal limits. Calcified mediastinal and hilar nodes consistent with prior granulomatous disease. Lungs/Pleura: Mild emphysema. Small bilateral pleural effusions. Bandlike density at the left lung base likely reflects atelectasis. Partial posterior consolidation, atelectasis versus mild pneumonia. 5 mm ground-glass nodule at the lingula without change and felt  benign, no follow-up imaging is recommended Musculoskeletal: Sternum is intact. Mild chronic compression deformities at T1, T3, and T8. Review of the MIP images confirms the above findings. 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 Diminutive heavily calcified native distal aorta and iliac vessels. Heavy calcification of the proximal abdominal aorta without aneurysm. Celiac: Appears diminutive. 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 Variant anatomy. SMA: Heavily calcified proximal SMA limits evaluation for stenosis. Splenic and right hepatic arteries appear to take their origin from the SMA. No aneurysm, occlusion or dissection. Renals: Single right and single left renal arteries also heavily calcified. Suspect at least moderate stenosis of the proximal arteries. IMA: Poorly visualized due to artifact from surgical clips. Could potentially be chronically occluded, vascular flow enhancement better visualized on the delayed venous images. Inflow: Status post aortoiliac bypass with patency. Heavily calcified diminutive native vessels. Heavily calcified internal iliac vessels. Veins: No obvious venous abnormality within the limitations of this arterial phase study. Review of the MIP images confirms the above findings. NON-VASCULAR Hepatobiliary: No calcified gallstone. Multiple hypodense subcentimeter liver lesions, too small to further characterize. No biliary dilatation Pancreas: Unremarkable. No pancreatic ductal dilatation or surrounding inflammatory changes. Spleen: Normal in size without focal abnormality. Adrenals/Urinary Tract: Adrenal glands are normal. Atrophic kidneys without hydronephrosis. Multiple renal cystic lesions without highly suspicious lesion, no specific imaging follow-up is recommended. The urinary bladder is decompressed but slightly thick-walled and indistinct. Stomach/Bowel: The stomach is nonenlarged. No dilated small  bowel. No acute bowel wall thickening. Lymphatic: No suspicious lymph nodes Reproductive: Prostate is unremarkable. Other: Negative for pelvic effusion or free air Musculoskeletal: Hardware in the right femur with artifact. Treated compression deformity at L3. Multiple chronic compression fractures involving L1, L2, L4 and L5. Review of the MIP images confirms the above findings. IMPRESSION: 1. Negative for acute pulmonary embolus. 2. Small pleural effusions. Mild airspace disease at left lung base, favor atelectasis and or scar over pneumonia 3. No CT evidence for acute intra-abdominal or pelvic abnormality. Atrophic kidneys without hydronephrosis. 4. 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 Electronically Signed   By: KDonavan FoilM.D.   On: 06/06/2022 19:47   CT Angio Abd/Pel W and/or Wo Contrast  Result Date: 06/06/2022 CLINICAL DATA:  Chest pain short of breath left upper quadrant abdominal pain EXAM: CT ANGIOGRAPHY CHEST, ABDOMEN AND PELVIS TECHNIQUE: Multidetector CT imaging through the chest, abdomen and pelvis was performed using the standard protocol during bolus administration of intravenous contrast. Multiplanar reconstructed  images and MIPs were obtained and reviewed to evaluate the vascular anatomy. RADIATION DOSE REDUCTION: This exam was performed according to the departmental dose-optimization program which includes automated exposure control, adjustment of the mA and/or kV according to patient size and/or use of iterative reconstruction technique. CONTRAST:  124m OMNIPAQUE IOHEXOL 350 MG/ML SOLN COMPARISON:  Radiograph 06/04/2022, CT abdomen pelvis 04/10/2022, 10/05/2021, CT chest 01/12/2021, CT chest 10/30/2017 FINDINGS: CTA CHEST FINDINGS Cardiovascular: Satisfactory opacification of the pulmonary arteries to the segmental level. No evidence of pulmonary  embolism. Advanced aortic atherosclerosis. No aneurysm. Extensive coronary vascular calcification. Borderline cardiac size. No pericardial effusion. Mediastinum/Nodes: Midline trachea. No thyroid mass. No suspicious lymph nodes. Esophagus within normal limits. Calcified mediastinal and hilar nodes consistent with prior granulomatous disease. Lungs/Pleura: Mild emphysema. Small bilateral pleural effusions. Bandlike density at the left lung base likely reflects atelectasis. Partial posterior consolidation, atelectasis versus mild pneumonia. 5 mm ground-glass nodule at the lingula without change and felt benign, no follow-up imaging is recommended Musculoskeletal: Sternum is intact. Mild chronic compression deformities at T1, T3, and T8. Review of the MIP images confirms the above findings. 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 Diminutive heavily calcified native distal aorta and iliac vessels. Heavy calcification of the proximal abdominal aorta without aneurysm. Celiac: Appears diminutive. 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 Variant anatomy. SMA: Heavily calcified proximal SMA limits evaluation for stenosis. Splenic and right hepatic arteries appear to take their origin from the SMA. No aneurysm, occlusion or dissection. Renals: Single right and single left renal arteries also heavily calcified. Suspect at least moderate stenosis of the proximal arteries. IMA: Poorly visualized due to artifact from surgical clips. Could potentially be chronically occluded, vascular flow enhancement better visualized on the delayed venous images. Inflow: Status post aortoiliac bypass with patency. Heavily calcified diminutive native vessels. Heavily calcified internal iliac vessels. Veins: No obvious venous abnormality within the limitations of this arterial phase study. Review of the MIP images confirms the above findings. NON-VASCULAR Hepatobiliary: No  calcified gallstone. Multiple hypodense subcentimeter liver lesions, too small to further characterize. No biliary dilatation Pancreas: Unremarkable. No pancreatic ductal dilatation or surrounding inflammatory changes. Spleen: Normal in size without focal abnormality. Adrenals/Urinary Tract: Adrenal glands are normal. Atrophic kidneys without hydronephrosis. Multiple renal cystic lesions without highly suspicious lesion, no specific imaging follow-up is recommended. The urinary bladder is decompressed but slightly thick-walled and indistinct. Stomach/Bowel: The stomach is nonenlarged. No dilated small bowel. No acute bowel wall thickening. Lymphatic: No suspicious lymph nodes Reproductive: Prostate is unremarkable. Other: Negative for pelvic effusion or free air Musculoskeletal: Hardware in the right femur with artifact. Treated compression deformity at L3. Multiple chronic compression fractures involving L1, L2, L4 and L5. Review of the MIP images confirms the above findings. IMPRESSION: 1. Negative for acute pulmonary embolus. 2. Small pleural effusions. Mild airspace disease at left lung base, favor atelectasis and or scar over pneumonia 3. No CT evidence for acute intra-abdominal or pelvic abnormality. Atrophic kidneys without hydronephrosis. 4. 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 Electronically Signed   By: KDonavan FoilM.D.   On: 06/06/2022 19:47   DG Abd 2 Views  Result Date: 06/04/2022 CLINICAL DATA:  Abdominal pain EXAM: ABDOMEN - 2 VIEW COMPARISON:  01/27/2007 FINDINGS: Bowel gas pattern is nonspecific. There is presence of gas in few slightly dilated  small bowel loops. Stomach is not distended. Gas and stool are present in colon. There is no fecal impaction in rectum. There is no pneumoperitoneum. Extensive arterial calcifications are noted. There is  vertebroplasty in L3 vertebra. There is decrease in height of multiple lumbar vertebral bodies. There is previous internal fixation in right femur. Surgical clips are seen in the para-aortic region and left inguinal region. IMPRESSION: Presence of gas in few slightly dilated small bowel loops may suggest ileus. There is no definite evidence of intestinal obstruction or pneumoperitoneum. Other findings as described in the body of the report. Electronically Signed   By: Elmer Picker M.D.   On: 06/04/2022 17:30    Assessment and Plan:   NSTEMI Patient unable to give a detailed history, however not having any active chest pain at this time.  He has elevated troponin that was rising, however difficult to tell whether this is true type I MI versus demand type II MI in the setting of anemia and ESRD.  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  He already received aspirin and this is okay to continue daily if he is tolerating.  Further work-up thus far in the emergency department has been unremarkable.  I would get an echo in the morning to evaluate for any wall motion abnormalities.  Otherwise I think medical management is most appropriate here.  -Continue aspirin 81 mg daily (if tolerated from a GI bleeding perspective) -Order echo in the morning      For questions or updates, please contact [place] Please consult [email or web address] for contact info under     Signed, Doyne Keel, MD  06/06/2022 10:19 PM

## 2022-06-06 NOTE — ED Notes (Signed)
[place]  CT tech notified that the patient has an IV and is ready for CT when next avaliable

## 2022-06-06 NOTE — Progress Notes (Signed)
Agree with the assessment and plan as outlined by Carl Best, NP.   81 year old medically complex male familiar to me from the office as well as hospital admission earlier this year.  Given chest pain, tachycardia, and his significant medical history, seems reasonable to send him to the ER for expedited evaluation.  Can also evaluate his LLQ pain at that same time.  Not sure if this is the same LLQ pain that he has endorsed in the past and has been evaluated extensively, or if this is a new pain.  Will see what ER evaluation provides.  I am currently on the inpatient service at [facility].  If he requires hospital admission, will plan to see Bill on the inpatient side.   Gerrit Heck, DO, [facility]

## 2022-06-06 NOTE — ED Notes (Signed)
Patient ambulates to the bed from EMS stretcher

## 2022-06-06 NOTE — ED Notes (Signed)
Attempted to stick patient for IV access x2 without success IV team order placed

## 2022-06-06 NOTE — ED Notes (Signed)
Provided snack at this time and water, patient verbalizes understanding of how to use call bell that is in reach. Patient verbalizes no complaints. Patient sitting in bed covered with blankets eating snack and watching TV. Door is open, call bell in reach, falls risk sign on door and bay mates notified of the patient's fall risks

## 2022-06-06 NOTE — Patient Instructions (Addendum)
  If you are age 81 or older, your body mass index should be between 23-30. Your Body mass index is 24.53 kg/m. If this is out of the aforementioned range listed, please consider follow up with your Primary Care Provider.  ________________________________________________________  The [facility] providers would like to encourage you to use [facility] to communicate with providers for non-urgent requests or questions.  Due to long hold times on the telephone, sending your provider a message by [facility] may be a faster and more efficient way to get a response.  Please allow 48 business hours for a response.  Please remember that this is for non-urgent requests.  _______________________________________________________  Patient taken to ER by ambulance.

## 2022-06-06 NOTE — H&P (Signed)
History and Physical    [name] OMB:[id number] DOB: 1941/06/19 DOA: 06/06/2022  PCP: Elby Showers, MD  Patient coming from: GI office  Chief Complaint: Chest pain, abdominal pain, tachycardia  HPI: [name] is a 81 y.o. 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  Seen in the ED 2 days ago for left-sided abdominal pain.  Work-up done at that time revealed 2 g drop in hemoglobin (hemoglobin 10.5 > 8.6) but FOBT negative.  X-ray showing potential small ileus.  He was discharged with plan to follow-up with PCP.  Patient was seen at GI office today for left lower quadrant abdominal pain.  He was tachycardic during this visit and complained of chest pain, sent to the ED for further evaluation.  In the ED, patient was in normal sinus rhythm with rate in the 70s to 80s.  EKG showing sinus rhythm with first-degree AV block (not new), T wave flattening in inferolateral leads new compared to his prior EKG but unchanged on repeat EKG.  Labs showing no leukocytosis, hemoglobin 9.2, high-sensitivity troponin 526> 643, BNP 3250, lipase and LFTs normal.  CTA chest/abdomen/pelvis showing: "IMPRESSION: 1. Negative for acute pulmonary embolus. 2. Small pleural effusions. Mild airspace disease at left lung base, favor atelectasis and or scar over pneumonia 3. No CT evidence for acute intra-abdominal or pelvic abnormality. Atrophic kidneys without hydronephrosis. 4. 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"  Patient was given aspirin and Seroquel. ED physician spoke to on-call cardiologist who  recommended not starting heparin at this time due to patient having history of chronic subdural hematomas.  Recommended obtaining routine echocardiogram, cardiology will consult.  [facility] called to admit.  Patient able to give limited history due to his dementia.  Oriented to self and knows he is at a hospital.  He reports left lower quadrant abdominal pain "for a while."  Denies vomiting or diarrhea.  Denies chest pain.  Reports exertional dyspnea.  Reports going for dialysis every Monday, Wednesday, and Friday.  He does not remember if he went for dialysis today.  Not able to give any additional history.  No family at bedside.  I spoke to the patient's wife [name] over the phone who informed me that patient usually experiences chest pain and shortness of breath right after dialysis and symptoms do not last very long.  He has been complaining of left-sided abdominal pain for the past 3 weeks which has gotten progressively worse since last week.  Wife states patient had dark stools for a day about 2 weeks ago but no longer having any dark stools or bloody stools at this time.  He did miss 1 session of dialysis last week as they were on vacation but did receive full dialysis treatment on Monday and 2 hours of treatment today.    Review of Systems:  Review of Systems  All other systems reviewed and are negative.   Past Medical History:  Diagnosis Date   Allergy    Alzheimer's disease ([place]) 01/19/2021   Anemia    Arthritis    Cataract    bil cateracts removed   Coronary artery disease    Dementia arising in the  senium and presenium ([place])    Diabetes mellitus    Type 2   Diverticulitis    ED (erectile dysfunction)    Elevated homocysteine    ESRD (end stage renal disease) on dialysis ([place]) 03/2015   M-W-F dialysis   GERD (gastroesophageal reflux disease)    pepto    Gout    Hiatal hernia    Hyperlipidemia    Hypertension    Hypothyroidism    PVD (peripheral vascular disease) ([place])     has plastic aorta   Renal insufficiency    Sleep apnea    does not wear c-pap    Past Surgical History:  Procedure Laterality Date   ANGIOPLASTY Right 01/26/2021   Procedure: ANGIOPLASTY RIGHT SUBCLAVIAN VEIN;  Surgeon: Serafina Mitchell, MD;  Location: [place];  Service: Vascular;  Laterality: Right;   aortobifemoral bypass     AV FISTULA PLACEMENT Left 12/01/2013   Procedure: ARTERIOVENOUS (AV) FISTULA CREATION- LEFT BRACHIOCEPHALIC;  Surgeon: Angelia Mould, MD;  Location: [place];  Service: Vascular;  Laterality: Left;   [place] Right 07/27/2014   Procedure: BASCILIC VEIN TRANSPOSITION;  Surgeon: Angelia Mould, MD;  Location: [place];  Service: Vascular;  Laterality: Right;   BRAIN SURGERY  07/22/2018   BREAST SURGERY     left - granulomatous mastitis   BURR HOLE Bilateral 07/22/2018   Procedure: BILATERAL BURR HOLES;  Surgeon: Kristeen Miss, MD;  Location: [place];  Service: Neurosurgery;  Laterality: Bilateral;   COLONOSCOPY     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 ([place] [facility])  1992   ENTEROSCOPY N/A 06/03/2018   Procedure: ENTEROSCOPY;  Surgeon: Lavena Bullion, DO;  Location: [facility];  Service: Gastroenterology;  Laterality: N/A;   ENTEROSCOPY N/A 11/20/2018   Procedure: ENTEROSCOPY;  Surgeon: Rush Landmark Telford Nab., MD;  Location: [place];  Service: Gastroenterology;  Laterality: N/A;   ENTEROSCOPY N/A 12/03/2019   Procedure: ENTEROSCOPY;  Surgeon: Lavena Bullion, DO;  Location: [facility];  Service: Gastroenterology;  Laterality: N/A;  push enteroscopy   ENTEROSCOPY N/A 07/04/2021   Procedure: ENTEROSCOPY;  Surgeon: Sharyn Creamer, MD;  Location: [facility]OSCOPY;  Service: Gastroenterology;  Laterality: N/A;   ENTEROSCOPY N/A 11/05/2021   Procedure: ENTEROSCOPY;  Surgeon: Daryel November, MD;  Location: [facility] ENDOSCOPY;  Service: Gastroenterology;  Laterality: N/A;   EYE SURGERY Bilateral    cataracts   FISTULOGRAM Right 01/26/2021    Procedure: FISTULOGRAM RIGHT;  Surgeon: Serafina Mitchell, MD;  Location: [facility];  Service: Vascular;  Laterality: Right;   HEMODIALYSIS INPATIENT  01/17/2018       HOT HEMOSTASIS N/A 06/03/2018   Procedure: HOT HEMOSTASIS (ARGON PLASMA COAGULATION/BICAP);  Surgeon: Lavena Bullion, DO;  Location: [facility] ENDOSCOPY;  Service: Gastroenterology;  Laterality: N/A;   HOT HEMOSTASIS N/A 11/20/2018   Procedure: HOT HEMOSTASIS (ARGON PLASMA COAGULATION/BICAP);  Surgeon: Irving Copas., MD;  Location: [place];  Service: Gastroenterology;  Laterality: N/A;   HOT HEMOSTASIS N/A 12/03/2019   Procedure: HOT HEMOSTASIS (ARGON PLASMA COAGULATION/BICAP);  Surgeon: Lavena Bullion, DO;  Location: [facility]OS[facility];  Service: Gastroenterology;  Laterality: N/A;   INTRAMEDULLARY (IM) NAIL INTERTROCHANTERIC Right 11/08/2020   Procedure: INTRAMEDULLARY (IM) NAIL INTERTROCHANTRIC;  Surgeon: Erle Crocker, MD;  Location: [place];  Service: Orthopedics;  Laterality: Right;   REVISION OF ARTERIOVENOUS GORETEX GRAFT Right 09/01/2019   Procedure: REVISION OF ARTERIOVENOUS FISTULA RIGHT ARM;  Surgeon: Serafina Mitchell, MD;  Location: [place];  Service: Vascular;  Laterality: Right;   REVISON OF ARTERIOVENOUS FISTULA Left 02/09/2014   Procedure: REVISON OF LEFT ARTERIOVENOUS FISTULA - RESECTION OF RENDUNDANT VEIN;  Surgeon: Angelia Mould, MD;  Location: [place];  Service: Vascular;  Laterality: Left;   REVISON OF ARTERIOVENOUS FISTULA Right 01/26/2021   Procedure: REVISON OF ARTERIOVENOUS FISTULA RIGHT;  Surgeon: Serafina Mitchell, MD;  Location: [place];  Service: Vascular;  Laterality: Right;   SBO with lysis adhesions     SHUNTOGRAM Left 04/19/2014   Procedure: FISTULOGRAM;  Surgeon: Angelia Mould, MD;  Location: [facility] CATH LAB;  Service: Cardiovascular;  Laterality: Left;   UNILATERAL UPPER EXTREMEITY ANGIOGRAM N/A 07/12/2014   Procedure: UNILATERAL UPPER Anselmo Rod;  Surgeon: Angelia Mould,  MD;  Location: [facility] CATH LAB;  Service: Cardiovascular;  Laterality: N/A;     reports that he quit smoking about 27 years ago. His smoking use included cigarettes. He has never been exposed to tobacco smoke. He has never used smokeless tobacco. He reports that he does not drink alcohol and does not use drugs.  Allergies  Allergen Reactions   Ambien [Zolpidem Tartrate] Other (See Comments)    Hallucinations and "felt crazy"    Penicillins Rash and Hives    Has patient had a PCN reaction causing immediate rash, facial/tongue/throat swelling, SOB or lightheadedness with hypotension: Yes Has patient had a PCN reaction causing severe rash involving mucus membranes or skin necrosis: Yes Has patient had a PCN reaction that required hospitalization: No Has patient had a PCN reaction occurring within the last 10 years: No If all of the above answers are "NO", then may proceed with Cephalosporin use.  Other reaction(s): HIVES    Family History  Problem Relation Age of Onset   Aneurysm Mother        brain   Heart disease Father    Stroke Father    Hypertension Father    Diabetes Father    Dementia Neg Hx    Colon cancer Neg Hx    Esophageal cancer Neg Hx    Pancreatic cancer Neg Hx    Prostate cancer Neg Hx    Rectal cancer Neg Hx    Stomach cancer Neg Hx     Prior to Admission medications   Medication Sig Start Date End Date Taking? Authorizing Provider  acetaminophen (TYLENOL) 325 MG tablet Take 1-2 tablets (325-650 mg total) by mouth every 4 (four) hours as needed for mild pain. 08/01/18   Love, Ivan Anchors, PA-C  albuterol (VENTOLIN HFA) 108 (90 Base) MCG/ACT inhaler Inhale 1 puff into the lungs daily as needed for wheezing or shortness of breath.    [provider]  allopurinol (ZYLOPRIM) 100 MG tablet Take 100 mg by mouth daily.    [provider]  Blood Glucose Monitoring Suppl (FREESTYLE FREEDOM LITE) w/Device KIT Use to check blood sugar 2 times per day dx code  E11.65 10/21/18   Elayne Snare, MD  camphor-menthol [facility]) lotion Apply 1 application topically 4 (four) times daily as needed for itching.     [provider]  Carboxymethylcellulose Sodium (THERATEARS) 0.25 % SOLN Place 1 drop into both eyes daily as needed (for dryness).    [provider]  cetirizine (ZYRTEC) 10 MG tablet Take 10 mg by mouth daily as needed for allergies.    [provider]  cyanocobalamin (,VITAMIN B-12,) 1000 MCG/ML injection Inject 1 mL (1,000 mcg total) into the muscle every 30 (thirty) days. 04/13/22   Elby Showers, MD  dicyclomine (BENTYL) 10 MG capsule Take 1 capsule (10 mg total) by mouth every 8 (eight) hours as needed for spasms. 12/12/21   Cirigliano, Vito V, DO  donepezil (ARICEPT) 10 MG tablet Take 1 tablet (10 mg total) by mouth at bedtime. 09/26/21   Melvenia Beam, MD  doxycycline (VIBRAMYCIN) 100 MG capsule Take 1 capsule (100 mg total) by mouth 2 (two) times daily. 04/10/22   Nelta Numbers, MD  fluticasone (FLONASE) 50 MCG/ACT nasal spray Place 1 spray into both nostrils daily. Patient taking differently: Place 1 spray into both nostrils daily as needed for allergies. 10/08/14   Mikhail, Velta Addison, DO  FORTEO 600 MCG/2.4ML SOPN INJECT 20 MCG UNDER THE SKIN DAILY (DISCARD 28 DAYS AFTER INITIAL USE) 05/14/22   Elayne Snare, MD  glucose blood (FREESTYLE LITE) test strip USE AS DIRECTED THREE TIMES DAILY 02/16/22   Elayne Snare, MD  hydrocortisone 2.5 % cream Apply 1 application topically 3 (three) times daily as needed (skin irritation (legs & arms)).  01/13/19   [provider]  Lancets (FREESTYLE) lancets Use as instructed to check blood sugar 2 times per day dx code E11.65 02/22/16   Elayne Snare, MD  levothyroxine (SYNTHROID, LEVOTHROID) 50 MCG tablet Take 1 tablet (50 mcg total) by mouth daily. Patient taking differently: Take 50 mcg by mouth daily before breakfast. 08/06/14   Baxley, Cresenciano Lick, MD  lidocaine (LIDODERM) 5 % Place 1  patch onto the skin daily as needed (back pain). 11/12/19   [provider]  memantine (NAMENDA) 10 MG tablet Take 1 tablet (10 mg total) by mouth 2 (two) times daily. 09/26/21   Melvenia Beam, MD  midodrine (PROAMATINE) 10 MG tablet Take one pill prior to hemodialysis on MWF Patient taking differently: Take 5 mg by mouth See admin instructions. Take one tablet by mouth prior to hemodialysis on MWF 08/13/18   Love, Ivan Anchors, PA-C  Nutritional Supplements (FEEDING SUPPLEMENT, NEPRO CARB STEADY,) LIQD Take 237 mLs by mouth daily.    [provider]  omeprazole (PRILOSEC) 40 MG capsule Take 1 capsule (40 mg total) by mouth daily. 03/13/18   Esterwood, Amy S, PA-C  QUEtiapine (SEROQUEL) 25 MG tablet Take 1 tablet (25 mg total) by mouth at bedtime. 05/22/22   Elby Showers, MD  repaglinide (PRANDIN) 0.5 MG tablet TAKE 1 TABLET(0.5 MG) BY MOUTH TWICE DAILY BEFORE A MEAL 05/29/22   Elayne Snare, MD  sevelamer carbonate (RENVELA) 800 MG tablet Take 1,600-2,400 mg by mouth See admin instructions. Take 2,400 mg by mouth three times a day with meals and 1,600 mg with snacks per spouse    [provider]  simvastatin (ZOCOR) 20 MG tablet Take 20 mg by mouth at bedtime.    [provider]  Syringe/Needle, Disp, (SYRINGE 3CC/25GX1") 25G X 1" 3 ML MISC 1 each by Does not apply route every 30 (thirty) days. 04/13/22   Elby Showers, MD  TRADJENTA 5 MG TABS tablet Take 1 tablet (5 mg total) by mouth daily. 11/16/21   Elayne Snare, MD    Physical Exam: Vitals:   06/06/22 1800 06/06/22 2000 06/06/22 2015 06/06/22 2030  BP:  115/78    Pulse: 69 71 69 70  Resp: _0 Temp:  98.6 F (37 C)    TempSrc:      SpO2: 98% 97% 97% 97%    Physical Exam Vitals reviewed.  Constitutional:      General: He is not in acute distress.  HENT:     Head: Normocephalic and atraumatic.  Eyes:     Extraocular Movements: Extraocular movements intact.  Cardiovascular:     Rate and Rhythm:  Normal rate and regular rhythm.     Pulses: Normal pulses.  Pulmonary:     Effort: Pulmonary effort is normal. No respiratory distress.     Breath sounds: No wheezing or rales.  Abdominal:     General: Bowel sounds are normal. There is no distension.     Palpations: Abdomen is soft.     Tenderness: There is no abdominal tenderness.  Musculoskeletal:        General: No swelling or tenderness.     Cervical back: Normal range of motion.  Skin:    General: Skin is warm and dry.  Neurological:     General: No focal deficit present.     Mental Status: He is alert.      Labs on Admission: I have personally reviewed following labs and imaging studies  CBC: Recent Labs  Lab 06/04/22 1634 06/05/22 1344 06/06/22 1703  WBC 6.7 6.8 6.3  NEUTROABS 4.5  --  4.0  HGB 8.6* 9.2* 9.2*  HCT 25.9* 27.9* 29.4*  MCV 98.9 99.5 105.0*  PLT 186 207.0 588   Basic Metabolic Panel: Recent Labs  Lab 06/04/22 1634 06/05/22 1116 06/06/22 1819  NA 139  --  143  K 3.3*  --  3.7  CL 94*  --  100  CO2 30  --  33*  GLUCOSE 134* 101* 113*  BUN 23  --  25*  CREATININE 5.35*  --  6.46*  CALCIUM 8.4* 9.2 8.5*   GFR: Estimated Creatinine Clearance: 8.5 mL/min (A) (by C-G formula based on SCr of 6.46 mg/dL (H)). Liver Function Tests: Recent Labs  Lab 06/04/22 1634 06/06/22 1819  AST 26 21  ALT 11 11  ALKPHOS 126 114  BILITOT 0.3 0.5  PROT 6.3* 5.8*  ALBUMIN 3.0* 2.8*   Recent Labs  Lab 06/04/22 1634 06/06/22 1819  LIPASE 46 46   No results for input(s): "AMMONIA" in the last 168 hours. Coagulation Profile: No results for input(s): "INR", "PROTIME" in the last 168 hours. Cardiac Enzymes: No results for input(s): "CKTOTAL", "CKMB", "CKMBINDEX", "TROPONINI" in the last 168 hours. BNP (last 3 results) No results for input(s): "PROBNP" in the last 8760 hours. HbA1C: No results for input(s): "HGBA1C" in the last 72 hours. CBG: No results for input(s): "GLUCAP" in the last 168  hours. Lipid Profile: No results for input(s): "CHOL", "HDL", "LDLCALC", "TRIG", "CHOLHDL", "LDLDIRECT" in the last 72 hours. Thyroid Function Tests: No results for input(s): "TSH", "T4TOTAL", "FREET4", "T3FREE", "THYROIDAB" in the last 72 hours. Anemia Panel: No results for input(s): "VITAMINB12", "FOLATE", "FERRITIN", "TIBC", "IRON", "RETICCTPCT" in the last 72 hours. Urine analysis:    Component Value Date/Time   COLORURINE AMBER (A) 04/09/2022 2051   APPEARANCEUR CLOUDY (A) 04/09/2022 2051   LABSPEC 1.013 04/09/2022 2051   PHURINE 8.0 04/09/2022 2051   GLUCOSEU NEGATIVE 04/09/2022 2051   HGBUR SMALL (A) 04/09/2022 2051   BILIRUBINUR NEGATIVE 04/09/2022 2051   BILIRUBINUR neg 02/14/2015 1115   KETONESUR NEGATIVE 04/09/2022 2051   PROTEINUR >=300 (A) 04/09/2022 2051   UROBILINOGEN 0.2 02/20/2015 0944   NITRITE NEGATIVE 04/09/2022 2051   LEUKOCYTESUR LARGE (A) 04/09/2022 2051    Radiological Exams on Admission: I have personally reviewed images CT Angio Chest PE W and/or Wo Contrast  Result Date: 06/06/2022 CLINICAL DATA:  Chest pain short of  breath left upper quadrant abdominal pain EXAM: CT ANGIOGRAPHY CHEST, ABDOMEN AND PELVIS TECHNIQUE: Multidetector CT imaging through the chest, abdomen and pelvis was performed using the standard protocol during bolus administration of intravenous contrast. Multiplanar reconstructed images and MIPs were obtained and reviewed to evaluate the vascular anatomy. RADIATION DOSE REDUCTION: This exam was performed according to the departmental dose-optimization program which includes automated exposure control, adjustment of the mA and/or kV according to patient size and/or use of iterative reconstruction technique. CONTRAST:  160m OMNIPAQUE IOHEXOL 350 MG/ML SOLN COMPARISON:  Radiograph 06/04/2022, CT abdomen pelvis 04/10/2022, 10/05/2021, CT chest 01/12/2021, CT chest 10/30/2017 FINDINGS: CTA CHEST FINDINGS Cardiovascular: Satisfactory opacification of  the pulmonary arteries to the segmental level. No evidence of pulmonary embolism. Advanced aortic atherosclerosis. No aneurysm. Extensive coronary vascular calcification. Borderline cardiac size. No pericardial effusion. Mediastinum/Nodes: Midline trachea. No thyroid mass. No suspicious lymph nodes. Esophagus within normal limits. Calcified mediastinal and hilar nodes consistent with prior granulomatous disease. Lungs/Pleura: Mild emphysema. Small bilateral pleural effusions. Bandlike density at the left lung base likely reflects atelectasis. Partial posterior consolidation, atelectasis versus mild pneumonia. 5 mm ground-glass nodule at the lingula without change and felt benign, no follow-up imaging is recommended Musculoskeletal: Sternum is intact. Mild chronic compression deformities at T1, T3, and T8. Review of the MIP images confirms the above findings. 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 Diminutive heavily calcified native distal aorta and iliac vessels. Heavy calcification of the proximal abdominal aorta without aneurysm. Celiac: Appears diminutive. 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 Variant anatomy. SMA: Heavily calcified proximal SMA limits evaluation for stenosis. Splenic and right hepatic arteries appear to take their origin from the SMA. No aneurysm, occlusion or dissection. Renals: Single right and single left renal arteries also heavily calcified. Suspect at least moderate stenosis of the proximal arteries. IMA: Poorly visualized due to artifact from surgical clips. Could potentially be chronically occluded, vascular flow enhancement better visualized on the delayed venous images. Inflow: Status post aortoiliac bypass with patency. Heavily calcified diminutive native vessels. Heavily calcified internal iliac vessels. Veins: No obvious venous abnormality within the limitations of this arterial phase study. Review of the  MIP images confirms the above findings. NON-VASCULAR Hepatobiliary: No calcified gallstone. Multiple hypodense subcentimeter liver lesions, too small to further characterize. No biliary dilatation Pancreas: Unremarkable. No pancreatic ductal dilatation or surrounding inflammatory changes. Spleen: Normal in size without focal abnormality. Adrenals/Urinary Tract: Adrenal glands are normal. Atrophic kidneys without hydronephrosis. Multiple renal cystic lesions without highly suspicious lesion, no specific imaging follow-up is recommended. The urinary bladder is decompressed but slightly thick-walled and indistinct. Stomach/Bowel: The stomach is nonenlarged. No dilated small bowel. No acute bowel wall thickening. Lymphatic: No suspicious lymph nodes Reproductive: Prostate is unremarkable. Other: Negative for pelvic effusion or free air Musculoskeletal: Hardware in the right femur with artifact. Treated compression deformity at L3. Multiple chronic compression fractures involving L1, L2, L4 and L5. Review of the MIP images confirms the above findings. IMPRESSION: 1. Negative for acute pulmonary embolus. 2. Small pleural effusions. Mild airspace disease at left lung base, favor atelectasis and or scar over pneumonia 3. No CT evidence for acute intra-abdominal or pelvic abnormality. Atrophic kidneys without hydronephrosis. 4. 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 Electronically Signed   By: KDonavan FoilM.D.   On: 06/06/2022 19:47  CT Angio Abd/Pel W and/or Wo Contrast  Result Date: 06/06/2022 CLINICAL DATA:  Chest pain short of breath left upper quadrant abdominal pain EXAM: CT ANGIOGRAPHY CHEST, ABDOMEN AND PELVIS TECHNIQUE: Multidetector CT imaging through the chest, abdomen and pelvis was performed using the standard protocol during bolus administration of intravenous  contrast. Multiplanar reconstructed images and MIPs were obtained and reviewed to evaluate the vascular anatomy. RADIATION DOSE REDUCTION: This exam was performed according to the departmental dose-optimization program which includes automated exposure control, adjustment of the mA and/or kV according to patient size and/or use of iterative reconstruction technique. CONTRAST:  152m OMNIPAQUE IOHEXOL 350 MG/ML SOLN COMPARISON:  Radiograph 06/04/2022, CT abdomen pelvis 04/10/2022, 10/05/2021, CT chest 01/12/2021, CT chest 10/30/2017 FINDINGS: CTA CHEST FINDINGS Cardiovascular: Satisfactory opacification of the pulmonary arteries to the segmental level. No evidence of pulmonary embolism. Advanced aortic atherosclerosis. No aneurysm. Extensive coronary vascular calcification. Borderline cardiac size. No pericardial effusion. Mediastinum/Nodes: Midline trachea. No thyroid mass. No suspicious lymph nodes. Esophagus within normal limits. Calcified mediastinal and hilar nodes consistent with prior granulomatous disease. Lungs/Pleura: Mild emphysema. Small bilateral pleural effusions. Bandlike density at the left lung base likely reflects atelectasis. Partial posterior consolidation, atelectasis versus mild pneumonia. 5 mm ground-glass nodule at the lingula without change and felt benign, no follow-up imaging is recommended Musculoskeletal: Sternum is intact. Mild chronic compression deformities at T1, T3, and T8. Review of the MIP images confirms the above findings. 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 Diminutive heavily calcified native distal aorta and iliac vessels. Heavy calcification of the proximal abdominal aorta without aneurysm. Celiac: Appears diminutive. 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 Variant anatomy. SMA: Heavily calcified proximal SMA limits evaluation for stenosis. Splenic and right hepatic arteries appear to take their  origin from the SMA. No aneurysm, occlusion or dissection. Renals: Single right and single left renal arteries also heavily calcified. Suspect at least moderate stenosis of the proximal arteries. IMA: Poorly visualized due to artifact from surgical clips. Could potentially be chronically occluded, vascular flow enhancement better visualized on the delayed venous images. Inflow: Status post aortoiliac bypass with patency. Heavily calcified diminutive native vessels. Heavily calcified internal iliac vessels. Veins: No obvious venous abnormality within the limitations of this arterial phase study. Review of the MIP images confirms the above findings. NON-VASCULAR Hepatobiliary: No calcified gallstone. Multiple hypodense subcentimeter liver lesions, too small to further characterize. No biliary dilatation Pancreas: Unremarkable. No pancreatic ductal dilatation or surrounding inflammatory changes. Spleen: Normal in size without focal abnormality. Adrenals/Urinary Tract: Adrenal glands are normal. Atrophic kidneys without hydronephrosis. Multiple renal cystic lesions without highly suspicious lesion, no specific imaging follow-up is recommended. The urinary bladder is decompressed but slightly thick-walled and indistinct. Stomach/Bowel: The stomach is nonenlarged. No dilated small bowel. No acute bowel wall thickening. Lymphatic: No suspicious lymph nodes Reproductive: Prostate is unremarkable. Other: Negative for pelvic effusion or free air Musculoskeletal: Hardware in the right femur with artifact. Treated compression deformity at L3. Multiple chronic compression fractures involving L1, L2, L4 and L5. Review of the MIP images confirms the above findings. IMPRESSION: 1. Negative for acute pulmonary embolus. 2. Small pleural effusions. Mild airspace disease at left lung base, favor atelectasis and or scar over pneumonia 3. No CT evidence for acute intra-abdominal or pelvic abnormality. Atrophic kidneys without  hydronephrosis. 4. 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 Patient is status post aorto bi femoral bypass with patency. Variant branching anatomy of  the celiac and SMA. Electronically Signed   By: Donavan Foil M.D.   On: 06/06/2022 19:47    Assessment and Plan  Chest pain, elevated troponin History of CAD Patient unable to give history due to his dementia.  Denies chest pain at this time. High-sensitivity troponin 526> 643.  EKG showing T wave flattening in inferolateral leads which is new compared to his prior EKG but unchanged on repeat EKG.  CTA chest negative for PE.  Cardiology consulted by ED physician, recommended not starting heparin at this time due to patient having history of chronic subdural hematomas.  Recommended obtaining routine echocardiogram, cardiology will consult. -Patient received aspirin in the ED. -Appreciate cardiology recommendations -Cardiac monitoring -Echocardiogram -Trend troponin  Left lower quadrant abdominal pain CT abdomen pelvis without explanation for the patient's abdominal pain.  Abdominal exam benign. -Symptomatic management, continue to monitor  ESRD on HD MWF Volume overload BNP elevated at 3250 and CT showing small pleural effusions. Echo done in February 2022 showing EF 55 to 88%, grade 1 diastolic dysfunction.  He missed 1 session of dialysis last week due to being on vacation, received treatment on Monday and 2 hours of treatment today per wife. Not hypoxic. 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  Work-up done in the ED 2 days ago revealed 2 g drop in hemoglobin (hemoglobin 10.5> 8.6) but FOBT negative.  Repeat hemoglobin today stable at 9.2.  Patient was seen by GI earlier today and it was  felt that he is not actively bleeding at this time. -Monitor H&H  Non-insulin-dependent type 2 diabetes A1c 6.5 on 05/17/2022. -Hold oral hypoglycemic agents -Very sensitive sliding scale insulin  Hypertension Stable, not on antihypertensives.  Receives midodrine on dialysis days for hypotension. -Monitor blood pressure closely  Hyperlipidemia -Continue statin  Hypothyroidism -Continue Synthroid  Gout -Continue allopurinol  GERD -Continue PPI  DVT prophylaxis: SCDs Code Status: DNR.  Per review of MOST form in the chart, patient is DNR.  Also discussed CODE STATUS with the patient's wife and she confirms that he is DNR. Family Communication: Wife updated on the phone. Consults called: Cardiology Level of care: Progressive Care Unit Admission status: It is my clinical opinion that admission to INPATIENT is reasonable and necessary because of the expectation that this patient will require hospital care that crosses at least 2 midnights to treat this condition based on the medical complexity of the problems presented.  Given the aforementioned information, the predictability of an adverse outcome is felt to be significant.   Shela Leff MD [facility]  If 7PM-7AM, please contact night-coverage [email or web address]  06/06/2022, 9:04 PM

## 2022-06-06 NOTE — ED Notes (Signed)
Patient transported to CT 

## 2022-06-06 NOTE — ED Notes (Signed)
Provider notified that the patient awaits IV team

## 2022-06-06 NOTE — ED Provider Notes (Signed)
Patient's visit was shared with PA, in short this is an 81 year old male with a past medical history of dementia, ESRD on HD, hypothyroidism, hyperparathyroidism, hypertension, hyperlipidemia and GERD presenting to the emergency department after an episode of tachycardia at the GI clinic.  He is remained in normal sinus rhythm at a rate in the 70s to 80s since he has been in the ED.  EKG showed lateral T wave flattening compared to his prior and was unchanged on serial EKGs.  He does have an elevated troponin increasing from the 500s to the 600s.  He was given aspirin.  CT PE study was negative for PE and CT abdomen and pelvis had no explanation for the patient's pain.  He will require admission for troponin trending and cardiac monitoring.   Ottie Glazier, DO 06/06/22 2030

## 2022-06-06 NOTE — ED Notes (Signed)
Provider at bedside

## 2022-06-07 ENCOUNTER — Inpatient Hospital Stay (HOSPITAL_COMMUNITY): Payer: TRICARE For Life (TFL)

## 2022-06-07 ENCOUNTER — Inpatient Hospital Stay (HOSPITAL_COMMUNITY): Payer: Medicare Other

## 2022-06-07 ENCOUNTER — Ambulatory Visit: Payer: TRICARE For Life (TFL) | Admitting: Endocrinology

## 2022-06-07 ENCOUNTER — Telehealth: Payer: Medicare Other | Admitting: Nurse Practitioner

## 2022-06-07 DIAGNOSIS — G309 Alzheimer's disease, unspecified: Secondary | ICD-10-CM | POA: Diagnosis not present

## 2022-06-07 DIAGNOSIS — R778 Other specified abnormalities of plasma proteins: Secondary | ICD-10-CM | POA: Diagnosis not present

## 2022-06-07 DIAGNOSIS — R079 Chest pain, unspecified: Secondary | ICD-10-CM

## 2022-06-07 DIAGNOSIS — E8770 Fluid overload, unspecified: Secondary | ICD-10-CM | POA: Diagnosis not present

## 2022-06-07 LAB — RENAL FUNCTION PANEL
Albumin: 2.7 g/dL — ABNORMAL LOW (ref 3.5–5.0)
Anion gap: 15 (ref 5–15)
BUN: 27 mg/dL — ABNORMAL HIGH (ref 8–23)
CO2: 30 mmol/L (ref 22–32)
Calcium: 8.6 mg/dL — ABNORMAL LOW (ref 8.9–10.3)
Chloride: 96 mmol/L — ABNORMAL LOW (ref 98–111)
Creatinine, Ser: 7.03 mg/dL — ABNORMAL HIGH (ref 0.61–1.24)
GFR, Estimated: 7 mL/min — ABNORMAL LOW (ref >60)
Glucose, Bld: 95 mg/dL (ref 70–99)
Phosphorus: 5 mg/dL — ABNORMAL HIGH (ref 2.5–4.6)
Potassium: 3.5 mmol/L (ref 3.5–5.1)
Sodium: 141 mmol/L (ref 135–145)

## 2022-06-07 LAB — CBC
HCT: 24.2 % — ABNORMAL LOW (ref 39.0–52.0)
Hemoglobin: 8 g/dL — ABNORMAL LOW (ref 13.0–17.0)
MCH: 33.3 pg (ref 26.0–34.0)
MCHC: 33.1 g/dL (ref 30.0–36.0)
MCV: 100.8 fL — ABNORMAL HIGH (ref 80.0–100.0)
Platelets: 179 10*3/uL (ref 150–400)
RBC: 2.4 MIL/uL — ABNORMAL LOW (ref 4.22–5.81)
RDW: 17.4 % — ABNORMAL HIGH (ref 11.5–15.5)
WBC: 5.6 10*3/uL (ref 4.0–10.5)
nRBC: 0 % (ref 0.0–0.2)

## 2022-06-07 LAB — GLUCOSE, CAPILLARY: Glucose-Capillary: 119 mg/dL — ABNORMAL HIGH (ref 70–99)

## 2022-06-07 LAB — ECHOCARDIOGRAM COMPLETE
Area-P 1/2: 3.74 cm2
Height: 67 in
MV M vel: 5.94 m/s
MV Peak grad: 141.1 mmHg
Radius: 0.5 cm
S' Lateral: 3.2 cm
Single Plane A2C EF: 41 %
Weight: 2505.6 oz

## 2022-06-07 LAB — HEPATITIS C ANTIBODY: HCV Ab: NONREACTIVE

## 2022-06-07 LAB — HEPATITIS B SURFACE ANTIBODY,QUALITATIVE: Hep B S Ab: REACTIVE — AB

## 2022-06-07 LAB — HEPATITIS B CORE ANTIBODY, TOTAL: Hep B Core Total Ab: NONREACTIVE

## 2022-06-07 LAB — CBG MONITORING, ED
Glucose-Capillary: 114 mg/dL — ABNORMAL HIGH (ref 70–99)
Glucose-Capillary: 111 mg/dL — ABNORMAL HIGH (ref 70–99)
Glucose-Capillary: 161 mg/dL — ABNORMAL HIGH (ref 70–99)

## 2022-06-07 LAB — TROPONIN I (HIGH SENSITIVITY): Troponin I (High Sensitivity): 589 ng/L (ref <18)

## 2022-06-07 LAB — HEPATITIS B SURFACE ANTIGEN: Hepatitis B Surface Ag: NONREACTIVE

## 2022-06-07 MED ORDER — DARBEPOETIN ALFA 150 MCG/0.3ML IJ SOSY
150.0000 ug | PREFILLED_SYRINGE | INTRAMUSCULAR | Status: DC
  Administered 2022-06-08: 150 ug via INTRAVENOUS
  Filled 2022-06-07 (×2): qty 0.3

## 2022-06-07 MED ORDER — ONDANSETRON HCL 4 MG/2ML IJ SOLN
4.0000 mg | Freq: Once | INTRAMUSCULAR | Status: AC
  Administered 2022-06-07: 4 mg via INTRAVENOUS
  Filled 2022-06-07: qty 2

## 2022-06-07 NOTE — Progress Notes (Signed)
[facility]) Hospital Liaison note:  This patient is currently enrolled in [facility] outpatient-based Palliative Care. Will continue to follow for disposition.  Please call with any outpatient palliative questions or concerns.  Thank you, Lorelee Market, LPN [facility] Liaison [phone]

## 2022-06-07 NOTE — Progress Notes (Signed)
Rounding Note    Patient Name: [name] Date of Encounter: 06/07/2022  [place] Cardiologist: Minus Breeding, MD   Subjective   No specific complaint.  Denies chest pain.  Reports abdominal pain.  Cannot tell when his last dialysis was.  Reports he had some issue while on vacation.  Inpatient Medications    Scheduled Meds:  allopurinol  100 mg Oral Daily   donepezil  10 mg Oral QHS   feeding supplement (NEPRO CARB STEADY)  237 mL Oral q AM   insulin aspart  0-5 Units Subcutaneous QHS   insulin aspart  0-6 Units Subcutaneous TID WC   levothyroxine  50 mcg Oral QAC breakfast   memantine  10 mg Oral BID   pantoprazole  40 mg Oral Daily   QUEtiapine  25 mg Oral QHS   sevelamer carbonate  2,400 mg Oral TID WC   And   sevelamer carbonate  1,600 mg Oral With snacks   simvastatin  20 mg Oral QHS   Continuous Infusions:  PRN Meds: acetaminophen **OR** acetaminophen   Vital Signs    Vitals:   06/07/22 0530 06/07/22 0545 06/07/22 0600 06/07/22 0609  BP: 134/85 128/77 130/76   Pulse: 73 72 73   Resp: '17 16 19   '$ Temp:    98 F (36.7 C)  TempSrc:    Oral  SpO2: 92% 98% 98%    No intake or output data in the 24 hours ending 06/07/22 0815    06/06/2022    2:10 PM 05/22/2022    3:30 PM 02/15/2022    2:02 PM  Last 3 Weights  Weight (lbs) 156 lb 9.6 oz 119 lb 150 lb 3.2 oz  Weight (kg) 71.033 kg 53.978 kg 68.13 kg      Telemetry    Sinus rhythm, PVC- Personally Reviewed  ECG    Sinus rhythm, PAC, LAFB- Personally Reviewed  Physical Exam   GEN: Elderly ill-appearing male in no acute distress.   Neck: No JVD Cardiac: RRR, no murmurs, rubs, or gallops.  Respiratory: Clear to auscultation bilaterally. GI: Soft, + tender, non-distended  MS: No edema; No deformity. Neuro: Dementia Psych: Dementia  Labs    High Sensitivity Troponin:   Recent Labs  Lab 06/06/22 1703 06/06/22 1814 06/06/22 2315  TROPONINIHS 526* 643* 589*      Chemistry Recent Labs  Lab 06/04/22 1634 06/05/22 1116 06/06/22 1819 06/07/22 0259  NA 139  --  143 141  K 3.3*  --  3.7 3.5  CL 94*  --  100 96*  CO2 30  --  33* 30  GLUCOSE 134* 101* 113* 95  BUN 23  --  25* 27*  CREATININE 5.35*  --  6.46* 7.03*  CALCIUM 8.4* 9.2 8.5* 8.6*  PROT 6.3*  --  5.8*  --   ALBUMIN 3.0*  --  2.8* 2.7*  AST 26  --  21  --   ALT 11  --  11  --   ALKPHOS 126  --  114  --   BILITOT 0.3  --  0.5  --   GFRNONAA 10*  --  8* 7*  ANIONGAP 15  --  10 15    Lipids No results for input(s): "CHOL", "TRIG", "HDL", "LABVLDL", "LDLCALC", "CHOLHDL" in the last 168 hours.  Hematology Recent Labs  Lab 06/04/22 1634 06/05/22 1344 06/06/22 1703 06/07/22 0259  WBC 6.7 6.8 6.3 5.6  RBC 2.62* 2.80* 2.80* 2.40*  HGB 8.6* 9.2*  9.2* 8.0*  HCT 25.9* 27.9* 29.4* 24.2*  MCV 98.9 99.5 105.0* 100.8*  MCH 32.8  --  32.9 33.3  MCHC 33.2 33.0 31.3 33.1  RDW 17.1* 18.8* 17.8* 17.4*  PLT 186 207.0 274 179   Thyroid No results for input(s): "TSH", "FREET4" in the last 168 hours.  BNP Recent Labs  Lab 06/06/22 2010  BNP 3,250.3*    DDimer No results for input(s): "DDIMER" in the last 168 hours.   Radiology    CT Angio Chest PE W and/or Wo Contrast  Result Date: 06/06/2022 CLINICAL DATA:  Chest pain short of breath left upper quadrant abdominal pain EXAM: CT ANGIOGRAPHY CHEST, ABDOMEN AND PELVIS TECHNIQUE: Multidetector CT imaging through the chest, abdomen and pelvis was performed using the standard protocol during bolus administration of intravenous contrast. Multiplanar reconstructed images and MIPs were obtained and reviewed to evaluate the vascular anatomy. RADIATION DOSE REDUCTION: This exam was performed according to the departmental dose-optimization program which includes automated exposure control, adjustment of the mA and/or kV according to patient size and/or use of iterative reconstruction technique. CONTRAST:  139m OMNIPAQUE IOHEXOL 350 MG/ML SOLN  COMPARISON:  Radiograph 06/04/2022, CT abdomen pelvis 04/10/2022, 10/05/2021, CT chest 01/12/2021, CT chest 10/30/2017 FINDINGS: CTA CHEST FINDINGS Cardiovascular: Satisfactory opacification of the pulmonary arteries to the segmental level. No evidence of pulmonary embolism. Advanced aortic atherosclerosis. No aneurysm. Extensive coronary vascular calcification. Borderline cardiac size. No pericardial effusion. Mediastinum/Nodes: Midline trachea. No thyroid mass. No suspicious lymph nodes. Esophagus within normal limits. Calcified mediastinal and hilar nodes consistent with prior granulomatous disease. Lungs/Pleura: Mild emphysema. Small bilateral pleural effusions. Bandlike density at the left lung base likely reflects atelectasis. Partial posterior consolidation, atelectasis versus mild pneumonia. 5 mm ground-glass nodule at the lingula without change and felt benign, no follow-up imaging is recommended Musculoskeletal: Sternum is intact. Mild chronic compression deformities at T1, T3, and T8. Review of the MIP images confirms the above findings. 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 Diminutive heavily calcified native distal aorta and iliac vessels. Heavy calcification of the proximal abdominal aorta without aneurysm. Celiac: Appears diminutive. 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 Variant anatomy. SMA: Heavily calcified proximal SMA limits evaluation for stenosis. Splenic and right hepatic arteries appear to take their origin from the SMA. No aneurysm, occlusion or dissection. Renals: Single right and single left renal arteries also heavily calcified. Suspect at least moderate stenosis of the proximal arteries. IMA: Poorly visualized due to artifact from surgical clips. Could potentially be chronically occluded, vascular flow enhancement better visualized on the delayed venous images. Inflow: Status post aortoiliac bypass with  patency. Heavily calcified diminutive native vessels. Heavily calcified internal iliac vessels. Veins: No obvious venous abnormality within the limitations of this arterial phase study. Review of the MIP images confirms the above findings. NON-VASCULAR Hepatobiliary: No calcified gallstone. Multiple hypodense subcentimeter liver lesions, too small to further characterize. No biliary dilatation Pancreas: Unremarkable. No pancreatic ductal dilatation or surrounding inflammatory changes. Spleen: Normal in size without focal abnormality. Adrenals/Urinary Tract: Adrenal glands are normal. Atrophic kidneys without hydronephrosis. Multiple renal cystic lesions without highly suspicious lesion, no specific imaging follow-up is recommended. The urinary bladder is decompressed but slightly thick-walled and indistinct. Stomach/Bowel: The stomach is nonenlarged. No dilated small bowel. No acute bowel wall thickening. Lymphatic: No suspicious lymph nodes Reproductive: Prostate is unremarkable. Other: Negative for pelvic effusion or free air Musculoskeletal: Hardware in the right femur with artifact. Treated compression  deformity at L3. Multiple chronic compression fractures involving L1, L2, L4 and L5. Review of the MIP images confirms the above findings. IMPRESSION: 1. Negative for acute pulmonary embolus. 2. Small pleural effusions. Mild airspace disease at left lung base, favor atelectasis and or scar over pneumonia 3. No CT evidence for acute intra-abdominal or pelvic abnormality. Atrophic kidneys without hydronephrosis. 4. 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 Electronically Signed   By: Donavan Foil M.D.   On: 06/06/2022 19:47   CT Angio Abd/Pel W and/or Wo Contrast  Result Date: 06/06/2022 CLINICAL DATA:  Chest pain short of breath left upper quadrant abdominal pain EXAM: CT  ANGIOGRAPHY CHEST, ABDOMEN AND PELVIS TECHNIQUE: Multidetector CT imaging through the chest, abdomen and pelvis was performed using the standard protocol during bolus administration of intravenous contrast. Multiplanar reconstructed images and MIPs were obtained and reviewed to evaluate the vascular anatomy. RADIATION DOSE REDUCTION: This exam was performed according to the departmental dose-optimization program which includes automated exposure control, adjustment of the mA and/or kV according to patient size and/or use of iterative reconstruction technique. CONTRAST:  1110m OMNIPAQUE IOHEXOL 350 MG/ML SOLN COMPARISON:  Radiograph 06/04/2022, CT abdomen pelvis 04/10/2022, 10/05/2021, CT chest 01/12/2021, CT chest 10/30/2017 FINDINGS: CTA CHEST FINDINGS Cardiovascular: Satisfactory opacification of the pulmonary arteries to the segmental level. No evidence of pulmonary embolism. Advanced aortic atherosclerosis. No aneurysm. Extensive coronary vascular calcification. Borderline cardiac size. No pericardial effusion. Mediastinum/Nodes: Midline trachea. No thyroid mass. No suspicious lymph nodes. Esophagus within normal limits. Calcified mediastinal and hilar nodes consistent with prior granulomatous disease. Lungs/Pleura: Mild emphysema. Small bilateral pleural effusions. Bandlike density at the left lung base likely reflects atelectasis. Partial posterior consolidation, atelectasis versus mild pneumonia. 5 mm ground-glass nodule at the lingula without change and felt benign, no follow-up imaging is recommended Musculoskeletal: Sternum is intact. Mild chronic compression deformities at T1, T3, and T8. Review of the MIP images confirms the above findings. 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 Diminutive heavily calcified native distal aorta and iliac vessels. Heavy calcification of the proximal abdominal aorta without aneurysm. Celiac: Appears  diminutive. 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 Variant anatomy. SMA: Heavily calcified proximal SMA limits evaluation for stenosis. Splenic and right hepatic arteries appear to take their origin from the SMA. No aneurysm, occlusion or dissection. Renals: Single right and single left renal arteries also heavily calcified. Suspect at least moderate stenosis of the proximal arteries. IMA: Poorly visualized due to artifact from surgical clips. Could potentially be chronically occluded, vascular flow enhancement better visualized on the delayed venous images. Inflow: Status post aortoiliac bypass with patency. Heavily calcified diminutive native vessels. Heavily calcified internal iliac vessels. Veins: No obvious venous abnormality within the limitations of this arterial phase study. Review of the MIP images confirms the above findings. NON-VASCULAR Hepatobiliary: No calcified gallstone. Multiple hypodense subcentimeter liver lesions, too small to further characterize. No biliary dilatation Pancreas: Unremarkable. No pancreatic ductal dilatation or surrounding inflammatory changes. Spleen: Normal in size without focal abnormality. Adrenals/Urinary Tract: Adrenal glands are normal. Atrophic kidneys without hydronephrosis. Multiple renal cystic lesions without highly suspicious lesion, no specific imaging follow-up is recommended. The urinary bladder is decompressed but slightly thick-walled and indistinct. Stomach/Bowel: The stomach is nonenlarged. No dilated small bowel. No acute bowel wall thickening. Lymphatic: No suspicious lymph nodes Reproductive: Prostate is  unremarkable. Other: Negative for pelvic effusion or free air Musculoskeletal: Hardware in the right femur with artifact. Treated compression deformity at L3. Multiple chronic compression fractures involving L1, L2, L4 and L5. Review of the MIP images confirms the above findings. IMPRESSION: 1. Negative for acute pulmonary embolus. 2. Small pleural  effusions. Mild airspace disease at left lung base, favor atelectasis and or scar over pneumonia 3. No CT evidence for acute intra-abdominal or pelvic abnormality. Atrophic kidneys without hydronephrosis. 4. 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 Electronically Signed   By: Donavan Foil M.D.   On: 06/06/2022 19:47    Cardiac Studies   Pending echo  Patient Profile     81 y.o. male with history of CAD, paroxysmal atrial tachycardia/paroxysmal atrial fibrillation, chronic subdural hematoma, chronic anemia secondary to recurrent GI bleed, end-stage renal disease on hemodialysis Monday Wednesday Friday, dementia, hypertension, hyperlipidemia, peripheral artery disease and obstructive sleep apnea seen for elevated troponin.  Patient was seen in ER 2 days ago with abdominal pain and found to be anemic.  Discharge from ER with outpatient work-up.  Yesterday while at GI clinic he had episode of tachycardia and sent to ER.  Assessment & Plan    Elevated troponin Reported chest discomfort but history unreliable given baseline dementia.  Troponin mildly elevated which is trending down in setting of end-stage renal disease with significantly elevated BNP and anemia.  Likely demand ischemia.  Pending echocardiogram.  Not a candidate for invasive evaluation given dementia and underlying bleeding issue.  Aspirin discontinued.  He got aspirin 324 mg x 1 yesterday.  2. Tachycardia Patient with history of atrial tachycardia and brief A-fib.  He not on anticoagulation due to high bleeding risk.  Not on any rate control agent as requiring midodrine prior to dialysis based on home medications.  Reported to have tachycardia at GI clinic yesterday but no strip available for review.  Telemetry overnight showing sinus rhythm with PVC.  No tachycardia noted.  If his blood pressure  tolerates, could consider low-dose rate control agent.  3.  Elevated BNP Unsure if he had a dialysis yesterday or not.  Does not appear grossly volume overloaded on exam  4.  Anemia/abdominal pain -Work-up per primary team. -Abdominal pain is current complaint.  5.  Peripheral vascular disease -Patency of aorto bifemoral bypass on CT angio of abdominal pelvis   For questions or updates, please contact [place] Please consult [email or web address] for contact info under        SignedLeanor Kail, PA  06/07/2022, 8:15 AM

## 2022-06-07 NOTE — Progress Notes (Signed)
PROGRESS NOTE  [name] OHY:[id number] DOB: Jul 02, 1941 DOA: 06/06/2022 PCP: Elby Showers, MD  HPI/Recap of past 24 hours: [name] is a 81 y.o. 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  Seen in the ED 2 days ago for left-sided abdominal pain.  Work-up done at that time revealed 2 g drop in hemoglobin (hemoglobin 10.5 > 8.6) but FOBT negative.  X-ray showing potential small ileus.  He was discharged with plan to follow-up with PCP.  Patient was seen at GI office 06/06/22 for left lower quadrant abdominal pain.  He was tachycardic during this visit and complained of chest pain, sent to the ED for further evaluation.  In the ED, patient was in normal sinus rhythm with rate in the 70s to 80s.  EKG showing sinus rhythm with first-degree AV block (not new), T wave flattening in inferolateral leads new compared to his prior EKG but unchanged on repeat EKG.  Labs showing no leukocytosis, hemoglobin 9.2, high-sensitivity troponin 526> 643, BNP 3250, lipase and LFTs normal.    Patient was given full dose of aspirin.  ED physician spoke to on-call cardiologist who recommended not starting heparin at this time due to patient having history of chronic subdural hematomas.  Recommended obtaining routine echocardiogram, cardiology will consult.  [facility] called to admit.   06/07/2022: The patient was seen and examined at bedside.  Endorses diffuse abdominal pain.  His last bowel movement was yesterday.  He has left chest wall tenderness on palpation.     Assessment/Plan: Principal Problem:   Chest pain Active Problems:   Elevated troponin   Hypothyroidism   End stage renal disease (HCC)   Diabetes mellitus type 2 in nonobese (HCC)    Hyperlipidemia, unspecified   Acute on chronic anemia   Alzheimer's disease, unspecified (CODE) (HCC)   Abdominal pain   Volume overload  Chest pain, elevated troponin History of CAD High-sensitivity troponin 526> 643> 589.  EKG showing T wave flattening in inferolateral leads which is new compared to his prior EKG but unchanged on repeat EKG.  CTA chest negative for PE.  Cardiology consulted by ED physician, recommended not starting heparin at this time due to patient having history of chronic subdural hematomas.  Recommended obtaining routine echocardiogram, cardiology following. Received a full dose aspirin on 06/06/2022.  Continue PPI while on aspirin for GI prophylaxis.   Left lower quadrant abdominal pain CT abdomen pelvis without explanation for the patient's abdominal pain.  Abdominal exam benign. -Symptomatic management, continue to monitor   ESRD on HD MWF Volume overload BNP elevated at 3250 and CT showing small pleural effusions. Echo done in February 2022 showing EF 55 to 94%, grade 1 diastolic dysfunction.  He missed 1 session of dialysis last week due to being on vacation, received treatment on Monday and 2 hours of treatment today per wife. Not hypoxic. Nephrology, Dr. Joelyn Oms, consulted, appreciate assistance. Volume status addressed with hemodialysis.   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  Work-up done in the ED 2 days ago revealed 2 g drop in hemoglobin (hemoglobin 10.5> 8.6) but FOBT negative.   Hemoglobin downtrending 8.0, unclear if dilutional.  Repeat hemoglobin  after hemodialysis.   Non-insulin-dependent type 2 diabetes A1c 6.5 on 05/17/2022. -Hold oral hypoglycemic agents -Very sensitive sliding scale insulin   Hypertension Stable, not on antihypertensives.  Receives midodrine on dialysis days for  hypotension. -Monitor blood pressure closely   Hyperlipidemia -Continue statin   Hypothyroidism -Continue Synthroid   Gout -Continue allopurinol   GERD -Continue PPI   DVT prophylaxis: SCDs Code Status: DNR.  Per review of MOST form in the chart, patient is DNR.  Also discussed CODE STATUS with the patient's wife and she confirms that he is DNR. Family Communication: Wife updated on the phone. Consults called: Cardiology, nephrology. Level of care: Progressive Care Unit   Status is: Inpatient     Objective: Vitals:   06/07/22 1000 06/07/22 1030 06/07/22 1140 06/07/22 1148  BP: 121/62 (!) 116/99 124/85   Pulse: 81 70 72   Resp: '18 12 18   '$ Temp: 98.2 F (36.8 C)     TempSrc:      SpO2: 100% 99% 98% 98%   No intake or output data in the 24 hours ending 06/07/22 1347 There were no vitals filed for this visit.  Exam:  General: 81 y.o. year-old male well developed well nourished in no acute distress.  Alert and interactive. Cardiovascular: Regular rate and rhythm with no rubs or gallops.  No thyromegaly or JVD noted.   Respiratory: Clear to auscultation with no wheezes or rales. Good inspiratory effort. Abdomen: Soft nontender nondistended with normal bowel sounds x4 quadrants. Musculoskeletal: Trace lower extremity edema bilaterally. Skin: No ulcerative lesions noted or rashes, Psychiatry: Mood is appropriate for condition and setting   Data Reviewed: CBC: Recent Labs  Lab 06/04/22 1634 06/05/22 1344 06/06/22 1703 06/07/22 0259  WBC 6.7 6.8 6.3 5.6  NEUTROABS 4.5  --  4.0  --   HGB 8.6* 9.2* 9.2* 8.0*  HCT 25.9* 27.9* 29.4* 24.2*  MCV 98.9 99.5 105.0* 100.8*  PLT 186 207.0 274 258   Basic Metabolic Panel: Recent Labs  Lab 06/04/22 1634 06/05/22 1116 06/06/22 1819 06/07/22 0259  NA 139  --  143 141  K 3.3*  --  3.7 3.5  CL 94*  --  100 96*  CO2 30  --  33* 30  GLUCOSE 134* 101* 113* 95  BUN 23  --  25* 27*  CREATININE 5.35*  --  6.46* 7.03*   CALCIUM 8.4* 9.2 8.5* 8.6*  PHOS  --   --   --  5.0*   GFR: Estimated Creatinine Clearance: 7.8 mL/min (A) (by C-G formula based on SCr of 7.03 mg/dL (H)). Liver Function Tests: Recent Labs  Lab 06/04/22 1634 06/06/22 1819 06/07/22 0259  AST 26 21  --   ALT 11 11  --   ALKPHOS 126 114  --   BILITOT 0.3 0.5  --   PROT 6.3* 5.8*  --   ALBUMIN 3.0* 2.8* 2.7*   Recent Labs  Lab 06/04/22 1634 06/06/22 1819  LIPASE 46 46   No results for input(s): "AMMONIA" in the last 168 hours. Coagulation Profile: No results for input(s): "INR", "PROTIME" in the last 168 hours. Cardiac Enzymes: No results for input(s): "CKTOTAL", "CKMB", "CKMBINDEX", "TROPONINI" in the last 168 hours. BNP (last 3 results) No results for input(s): "PROBNP" in the last 8760 hours. HbA1C: No results for input(s): "HGBA1C" in the last 72 hours. CBG: Recent Labs  Lab 06/06/22 2308 06/07/22 0742 06/07/22 1136  GLUCAP 162* 114* 111*   Lipid Profile: No results for input(s): "  CHOL", "HDL", "LDLCALC", "TRIG", "CHOLHDL", "LDLDIRECT" in the last 72 hours. Thyroid Function Tests: No results for input(s): "TSH", "T4TOTAL", "FREET4", "T3FREE", "THYROIDAB" in the last 72 hours. Anemia Panel: No results for input(s): "VITAMINB12", "FOLATE", "FERRITIN", "TIBC", "IRON", "RETICCTPCT" in the last 72 hours. Urine analysis:    Component Value Date/Time   COLORURINE AMBER (A) 04/09/2022 2051   APPEARANCEUR CLOUDY (A) 04/09/2022 2051   LABSPEC 1.013 04/09/2022 2051   PHURINE 8.0 04/09/2022 2051   GLUCOSEU NEGATIVE 04/09/2022 2051   HGBUR SMALL (A) 04/09/2022 2051   BILIRUBINUR NEGATIVE 04/09/2022 2051   BILIRUBINUR neg 02/14/2015 1115   KETONESUR NEGATIVE 04/09/2022 2051   PROTEINUR >=300 (A) 04/09/2022 2051   UROBILINOGEN 0.2 02/20/2015 0944   NITRITE NEGATIVE 04/09/2022 2051   LEUKOCYTESUR LARGE (A) 04/09/2022 2051   Sepsis Labs: '@LABRCNTIP'$ (procalcitonin:4,lacticidven:4)  )No results found for this or any  previous visit (from the past 240 hour(s)).    Studies: CT Angio Chest PE W and/or Wo Contrast  Result Date: 06/06/2022 CLINICAL DATA:  Chest pain short of breath left upper quadrant abdominal pain EXAM: CT ANGIOGRAPHY CHEST, ABDOMEN AND PELVIS TECHNIQUE: Multidetector CT imaging through the chest, abdomen and pelvis was performed using the standard protocol during bolus administration of intravenous contrast. Multiplanar reconstructed images and MIPs were obtained and reviewed to evaluate the vascular anatomy. RADIATION DOSE REDUCTION: This exam was performed according to the departmental dose-optimization program which includes automated exposure control, adjustment of the mA and/or kV according to patient size and/or use of iterative reconstruction technique. CONTRAST:  13m OMNIPAQUE IOHEXOL 350 MG/ML SOLN COMPARISON:  Radiograph 06/04/2022, CT abdomen pelvis 04/10/2022, 10/05/2021, CT chest 01/12/2021, CT chest 10/30/2017 FINDINGS: CTA CHEST FINDINGS Cardiovascular: Satisfactory opacification of the pulmonary arteries to the segmental level. No evidence of pulmonary embolism. Advanced aortic atherosclerosis. No aneurysm. Extensive coronary vascular calcification. Borderline cardiac size. No pericardial effusion. Mediastinum/Nodes: Midline trachea. No thyroid mass. No suspicious lymph nodes. Esophagus within normal limits. Calcified mediastinal and hilar nodes consistent with prior granulomatous disease. Lungs/Pleura: Mild emphysema. Small bilateral pleural effusions. Bandlike density at the left lung base likely reflects atelectasis. Partial posterior consolidation, atelectasis versus mild pneumonia. 5 mm ground-glass nodule at the lingula without change and felt benign, no follow-up imaging is recommended Musculoskeletal: Sternum is intact. Mild chronic compression deformities at T1, T3, and T8. Review of the MIP images confirms the above findings. CTA ABDOMEN AND PELVIS FINDINGS VASCULAR Aorta: Status  post aorto femoral bypass graft with luminal patency. Diminutive heavily calcified native distal aorta and iliac vessels. Heavy calcification of the proximal abdominal aorta without aneurysm. Celiac: Appears diminutive. 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 Variant anatomy. SMA: Heavily calcified proximal SMA limits evaluation for stenosis. Splenic and right hepatic arteries appear to take their origin from the SMA. No aneurysm, occlusion or dissection. Renals: Single right and single left renal arteries also heavily calcified. Suspect at least moderate stenosis of the proximal arteries. IMA: Poorly visualized due to artifact from surgical clips. Could potentially be chronically occluded, vascular flow enhancement better visualized on the delayed venous images. Inflow: Status post aortoiliac bypass with patency. Heavily calcified diminutive native vessels. Heavily calcified internal iliac vessels. Veins: No obvious venous abnormality within the limitations of this arterial phase study. Review of the MIP images confirms the above findings. NON-VASCULAR Hepatobiliary: No calcified gallstone. Multiple hypodense subcentimeter liver lesions, too small to further characterize. No biliary dilatation Pancreas: Unremarkable. No pancreatic ductal dilatation or surrounding inflammatory changes. Spleen: Normal in size without focal abnormality.  Adrenals/Urinary Tract: Adrenal glands are normal. Atrophic kidneys without hydronephrosis. Multiple renal cystic lesions without highly suspicious lesion, no specific imaging follow-up is recommended. The urinary bladder is decompressed but slightly thick-walled and indistinct. Stomach/Bowel: The stomach is nonenlarged. No dilated small bowel. No acute bowel wall thickening. Lymphatic: No suspicious lymph nodes Reproductive: Prostate is unremarkable. Other: Negative for pelvic effusion or free air Musculoskeletal: Hardware in the right femur with artifact. Treated compression  deformity at L3. Multiple chronic compression fractures involving L1, L2, L4 and L5. Review of the MIP images confirms the above findings. IMPRESSION: 1. Negative for acute pulmonary embolus. 2. Small pleural effusions. Mild airspace disease at left lung base, favor atelectasis and or scar over pneumonia 3. No CT evidence for acute intra-abdominal or pelvic abnormality. Atrophic kidneys without hydronephrosis. 4. 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 Electronically Signed   By: Donavan Foil M.D.   On: 06/06/2022 19:47   CT Angio Abd/Pel W and/or Wo Contrast  Result Date: 06/06/2022 CLINICAL DATA:  Chest pain short of breath left upper quadrant abdominal pain EXAM: CT ANGIOGRAPHY CHEST, ABDOMEN AND PELVIS TECHNIQUE: Multidetector CT imaging through the chest, abdomen and pelvis was performed using the standard protocol during bolus administration of intravenous contrast. Multiplanar reconstructed images and MIPs were obtained and reviewed to evaluate the vascular anatomy. RADIATION DOSE REDUCTION: This exam was performed according to the departmental dose-optimization program which includes automated exposure control, adjustment of the mA and/or kV according to patient size and/or use of iterative reconstruction technique. CONTRAST:  118m OMNIPAQUE IOHEXOL 350 MG/ML SOLN COMPARISON:  Radiograph 06/04/2022, CT abdomen pelvis 04/10/2022, 10/05/2021, CT chest 01/12/2021, CT chest 10/30/2017 FINDINGS: CTA CHEST FINDINGS Cardiovascular: Satisfactory opacification of the pulmonary arteries to the segmental level. No evidence of pulmonary embolism. Advanced aortic atherosclerosis. No aneurysm. Extensive coronary vascular calcification. Borderline cardiac size. No pericardial effusion. Mediastinum/Nodes: Midline trachea. No thyroid mass. No suspicious lymph nodes. Esophagus  within normal limits. Calcified mediastinal and hilar nodes consistent with prior granulomatous disease. Lungs/Pleura: Mild emphysema. Small bilateral pleural effusions. Bandlike density at the left lung base likely reflects atelectasis. Partial posterior consolidation, atelectasis versus mild pneumonia. 5 mm ground-glass nodule at the lingula without change and felt benign, no follow-up imaging is recommended Musculoskeletal: Sternum is intact. Mild chronic compression deformities at T1, T3, and T8. Review of the MIP images confirms the above findings. 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 Diminutive heavily calcified native distal aorta and iliac vessels. Heavy calcification of the proximal abdominal aorta without aneurysm. Celiac: Appears diminutive. 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 Variant anatomy. SMA: Heavily calcified proximal SMA limits evaluation for stenosis. Splenic and right hepatic arteries appear to take their origin from the SMA. No aneurysm, occlusion or dissection. Renals: Single right and single left renal arteries also heavily calcified. Suspect at least moderate stenosis of the proximal arteries. IMA: Poorly visualized due to artifact from surgical clips. Could potentially be chronically occluded, vascular flow enhancement better visualized on the delayed venous images. Inflow: Status post aortoiliac bypass with patency. Heavily calcified diminutive native vessels. Heavily calcified internal iliac vessels. Veins: No obvious venous abnormality within the limitations of this arterial phase study. Review of the MIP images confirms the above findings. NON-VASCULAR Hepatobiliary: No calcified gallstone. Multiple hypodense subcentimeter liver lesions, too small to further characterize. No  biliary dilatation Pancreas: Unremarkable. No pancreatic ductal dilatation or surrounding inflammatory changes. Spleen: Normal in  size without focal abnormality. Adrenals/Urinary Tract: Adrenal glands are normal. Atrophic kidneys without hydronephrosis. Multiple renal cystic lesions without highly suspicious lesion, no specific imaging follow-up is recommended. The urinary bladder is decompressed but slightly thick-walled and indistinct. Stomach/Bowel: The stomach is nonenlarged. No dilated small bowel. No acute bowel wall thickening. Lymphatic: No suspicious lymph nodes Reproductive: Prostate is unremarkable. Other: Negative for pelvic effusion or free air Musculoskeletal: Hardware in the right femur with artifact. Treated compression deformity at L3. Multiple chronic compression fractures involving L1, L2, L4 and L5. Review of the MIP images confirms the above findings. IMPRESSION: 1. Negative for acute pulmonary embolus. 2. Small pleural effusions. Mild airspace disease at left lung base, favor atelectasis and or scar over pneumonia 3. No CT evidence for acute intra-abdominal or pelvic abnormality. Atrophic kidneys without hydronephrosis. 4. 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 Electronically Signed   By: Donavan Foil M.D.   On: 06/06/2022 19:47    Scheduled Meds:  allopurinol  100 mg Oral Daily   [START ON 06/08/2022] darbepoetin (ARANESP) injection - DIALYSIS  150 mcg Intravenous Q Fri-HD   donepezil  10 mg Oral QHS   feeding supplement (NEPRO CARB STEADY)  237 mL Oral q AM   insulin aspart  0-5 Units Subcutaneous QHS   insulin aspart  0-6 Units Subcutaneous TID WC   levothyroxine  50 mcg Oral QAC breakfast   memantine  10 mg Oral BID   pantoprazole  40 mg Oral Daily   QUEtiapine  25 mg Oral QHS   sevelamer carbonate  2,400 mg Oral TID WC   And   sevelamer carbonate  1,600 mg Oral With snacks   simvastatin  20 mg Oral QHS    Continuous Infusions:   LOS: 1 day     Kayleen Memos, MD [facility] Pager [phone]  If 7PM-7AM, please contact night-coverage [email or web address] Password [email or web address] 06/07/2022, 1:47 PM

## 2022-06-07 NOTE — Progress Notes (Signed)
____________________________________________________________  Attending physician addendum:  Thank you for sending this case to me and for asking me to see him during the clinic visit. I have reviewed the entire note and agree with the plan.  He was no longer tachycardic at the time of ED evaluation, and he did have an EKG. CTA chest abdomen pelvis does not show any acute cause for his chest pain or abdominal pain.  Extensive atherosclerotic disease as previously seen.  Dr. Bryan Lemma also put an addendum on this note and indicates this patient has had extensive work-up for the chronic abdominal pain that is difficult to characterize, at least in part due to the patient's dementia.  Johnny Lund, MD  ____________________________________________________________

## 2022-06-07 NOTE — ED Notes (Signed)
ED TO INPATIENT HANDOFF REPORT  ED Nurse Name and Phone #: Karizma Cheek RN [phone]  S Name/Age/Gender [name] 81 y.o. male Room/Bed: 011C/011C  Code Status   Code Status: DNR  Home/SNF/Other Home Patient oriented to: self and place Is this baseline? Yes   Triage Complete: Triage complete  Chief Complaint Chest pain [R07.9]  Triage Note Per ems at baseline patient is a&o x1 due to dementia. Per ems patient was discharged here not long ago and seen at the GI clinic. GI clinic called out to EMS to report that the patient is having chest pain radiating to the right arm. Per EMS VSS. Per EMS patient has no complaints of CP. Per EMS patients wife takes care of him at home and states that there are times where he says "yes" to everything due to his dementia   Per EMS patient is having left lower abdominal pain. CBG 133. Patient ambulates with assistance/walker   Allergies Allergies  Allergen Reactions   Ambien [Zolpidem Tartrate] Other (See Comments)    Hallucinations and "felt crazy"    Penicillins Hives and Rash    Has patient had a PCN reaction causing immediate rash, facial/tongue/throat swelling, SOB or lightheadedness with hypotension: Yes Has patient had a PCN reaction causing severe rash involving mucus membranes or skin necrosis: Yes Has patient had a PCN reaction that required hospitalization: No Has patient had a PCN reaction occurring within the last 10 years: No If all of the above answers are "NO", then may proceed with Cephalosporin use.     Level of Care/Admitting Diagnosis ED Disposition     ED Disposition  Admit   Condition  --   [place]: [place] [[id number]]  Level of Care: Telemetry Cardiac [[id number]]  May admit patient to [facility] or [facility] if equivalent level of care is available:: No  Covid Evaluation: Asymptomatic - no recent exposure (last 10 days) testing not required  Diagnosis: Chest pain [[id number]]  Admitting  Physician: Kayleen Memos [[id number]]  Attending Physician: Kayleen Memos [[id number]]  Certification:: I certify this patient will need inpatient services for at least 2 midnights          B Medical/Surgery History Past Medical History:  Diagnosis Date   Allergy    Alzheimer's disease ([place]) 01/19/2021   Anemia    Arthritis    Cataract    bil cateracts removed   Coronary artery disease    Dementia arising in the senium and presenium ([place])    Diabetes mellitus    Type 2   Diverticulitis    ED (erectile dysfunction)    Elevated homocysteine    ESRD (end stage renal disease) on dialysis ([place]) 03/2015   M-W-F dialysis   GERD (gastroesophageal reflux disease)    pepto    Gout    Hiatal hernia    Hyperlipidemia    Hypertension    Hypothyroidism    PVD (peripheral vascular disease) ([place])    has plastic aorta   Renal insufficiency    Sleep apnea    does not wear c-pap   Past Surgical History:  Procedure Laterality Date   ANGIOPLASTY Right 01/26/2021   Procedure: ANGIOPLASTY RIGHT SUBCLAVIAN VEIN;  Surgeon: Serafina Mitchell, MD;  [facility];  Service: Vascular;  Laterality: Right;   aortobifemoral bypass     AV FISTULA PLACEMENT Left 12/01/2013   Procedure: ARTERIOVENOUS (AV) FISTULA CREATION- LEFT BRACHIOCEPHALIC;  Surgeon: Angelia Mould, MD;  Location: [place] OR;  Service: Vascular;  Laterality: Left;   [place] TRANSPOSITION Right 07/27/2014   Procedure: BASCILIC VEIN TRANSPOSITION;  Surgeon: Angelia Mould, MD;  Location: [place];  Service: Vascular;  Laterality: Right;   BRAIN SURGERY  07/22/2018   BREAST SURGERY     left - granulomatous mastitis   BURR HOLE Bilateral 07/22/2018   Procedure: BILATERAL BURR HOLES;  Surgeon: Kristeen Miss, MD;  Location: [place];  Service: Neurosurgery;  Laterality: Bilateral;   COLONOSCOPY     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   ENTEROSCOPY N/A 06/03/2018   Procedure: ENTEROSCOPY;  Surgeon: Lavena Bullion, DO;  [facility];  Service: Gastroenterology;  Laterality: N/A;   ENTEROSCOPY N/A 11/20/2018   Procedure: ENTEROSCOPY;  Surgeon: Rush Landmark Telford Nab., MD;  Location: [place];  Service: Gastroenterology;  Laterality: N/A;   ENTEROSCOPY N/A 12/03/2019   Procedure: ENTEROSCOPY;  Surgeon: Lavena Bullion, DO;  [facility];  Service: Gastroenterology;  Laterality: N/A;  push enteroscopy   ENTEROSCOPY N/A 07/04/2021   Procedure: ENTEROSCOPY;  Surgeon: Sharyn Creamer, MD;  Location[facility] ENDOSCOPY;  Service: Gastroenterology;  Laterality: N/A;   ENTEROSCOPY N/A 11/05/2021   Procedure: ENTEROSCOPY;  Surgeon: Daryel November, MD;  Location: [facility] ENDOSCOPY;  Service: Gastroenterology;  Laterality: N/A;   EYE SURGERY Bilateral    cataracts   FISTULOGRAM Right 01/26/2021   Procedure: FISTULOGRAM RIGHT;  Surgeon: Serafina Mitchell, MD;  Location: [facility] OR;  Service: Vascular;  Laterality: Right;   HEMODIALYSIS INPATIENT  01/17/2018       HOT HEMOSTASIS N/A 06/03/2018   Procedure: HOT HEMOSTASIS (ARGON PLASMA COAGULATION/BICAP);  Surgeon: Lavena Bullion, DO;  Location: [facility] ENDOSCOPY;  Service: Gastroenterology;  Laterality: N/A;   HOT HEMOSTASIS N/A 11/20/2018   Procedure: HOT HEMOSTASIS (ARGON PLASMA COAGULATION/BICAP);  Surgeon: Irving Copas., MD;  Location: [place];  Service: Gastroenterology;  Laterality: N/A;   HOT HEMOSTASIS N/A 12/03/2019   Procedure: HOT HEMOSTASIS (ARGON PLASMA COAGULATION/BICAP);  Surgeon: Lavena Bullion, DO;  [facility];  Service: Gastroenterology;  Laterality: N/A;   INTRAMEDULLARY (IM) NAIL INTERTROCHANTERIC Right 11/08/2020   Procedure: INTRAMEDULLARY (IM) NAIL INTERTROCHANTRIC;  Surgeon: Erle Crocker, MD;  Location: [place];  Service: Orthopedics;  Laterality: Right;   REVISION OF ARTERIOVENOUS GORETEX GRAFT Right 09/01/2019   Procedure: REVISION OF ARTERIOVENOUS FISTULA RIGHT ARM;  Surgeon: Serafina Mitchell, MD;   Location: [place];  Service: Vascular;  Laterality: Right;   REVISON OF ARTERIOVENOUS FISTULA Left 02/09/2014   Procedure: REVISON OF LEFT ARTERIOVENOUS FISTULA - RESECTION OF RENDUNDANT VEIN;  Surgeon: Angelia Mould, MD;  Location: [place];  Service: Vascular;  Laterality: Left;   REVISON OF ARTERIOVENOUS FISTULA Right 01/26/2021   Procedure: REVISON OF ARTERIOVENOUS FISTULA RIGHT;  Surgeon: Serafina Mitchell, MD;  Location: [place];  Service: Vascular;  Laterality: Right;   SBO with lysis adhesions     SHUNTOGRAM Left 04/19/2014   Procedure: FISTULOGRAM;  Surgeon: Angelia Mould, MD;  Location: [facility] CATH LAB;  Service: Cardiovascular;  Laterality: Left;   UNILATERAL UPPER EXTREMEITY ANGIOGRAM N/A 07/12/2014   Procedure: UNILATERAL UPPER Anselmo Rod;  Surgeon: Angelia Mould, MD;  Location: [facility] CATH LAB;  Service: Cardiovascular;  Laterality: N/A;     A IV Location/Drains/Wounds Patient Lines/Drains/Airways Status     Active Line/Drains/Airways     Name Placement date Placement time Site Days   Peripheral IV 06/06/22 20 G  1.88" Left Antecubital 06/06/22  1812  Antecubital  1   Fistula / Graft Right Upper arm Arteriovenous fistula 07/27/14  1048  Upper arm  2872   Fistula / Graft Right Upper arm Arteriovenous fistula --  --  Upper arm  --   Pressure Injury 11/04/21 Coccyx Mid Stage 2 -  Partial thickness loss of dermis presenting as a shallow open injury with a red, pink wound bed without slough. pink 11/04/21  2100  -- 215   Wound / Incision (Open or Dehisced) 11/04/21 (MASD) Moisture Associated Skin Damage Buttocks Right;Left pink 11/04/21  2100  Buttocks  215            Intake/Output Last 24 hours No intake or output data in the 24 hours ending 06/07/22 1751  Labs/Imaging Results for orders placed or performed during the hospital encounter of 06/06/22 (from the past 48 hour(s))  CBC with Differential     Status: Abnormal   Collection Time: 06/06/22  5:03 PM   Result Value Ref Range   WBC 6.3 4.0 - 10.5 K/uL   RBC 2.80 (L) 4.22 - 5.81 MIL/uL   Hemoglobin 9.2 (L) 13.0 - 17.0 g/dL   HCT 29.4 (L) 39.0 - 52.0 %   MCV 105.0 (H) 80.0 - 100.0 fL   MCH 32.9 26.0 - 34.0 pg   MCHC 31.3 30.0 - 36.0 g/dL   RDW 17.8 (H) 11.5 - 15.5 %   Platelets 274 150 - 400 K/uL   nRBC 0.0 0.0 - 0.2 %   Neutrophils Relative % 64 %   Neutro Abs 4.0 1.7 - 7.7 K/uL   Lymphocytes Relative 22 %   Lymphs Abs 1.4 0.7 - 4.0 K/uL   Monocytes Relative 10 %   Monocytes Absolute 0.6 0.1 - 1.0 K/uL   Eosinophils Relative 3 %   Eosinophils Absolute 0.2 0.0 - 0.5 K/uL   Basophils Relative 1 %   Basophils Absolute 0.1 0.0 - 0.1 K/uL   Immature Granulocytes 0 %   Abs Immature Granulocytes 0.02 0.00 - 0.07 K/uL    Comment: Performed at [facility], [address], [place], [place] [address]  Troponin I (High Sensitivity)     Status: Abnormal   Collection Time: 06/06/22  5:03 PM  Result Value Ref Range   Troponin I (High Sensitivity) 526 (HH) <18 ng/L    Comment: CRITICAL RESULT CALLED TO, READ BACK BY AND VERIFIED WITH L.BENNETT,RN '@1832'$  06/06/2022 VANG.J (NOTE) Elevated high sensitivity troponin I (hsTnI) values and significant  changes across serial measurements may suggest ACS but many other  chronic and acute conditions are known to elevate hsTnI results.  Refer to the "Links" section for chest pain algorithms and additional  guidance. Performed at [facility], [place] [address], [place], [place] [address]   Troponin I (High Sensitivity)     Status: Abnormal   Collection Time: 06/06/22  6:14 PM  Result Value Ref Range   Troponin I (High Sensitivity) 643 (HH) <18 ng/L    Comment: CRITICAL VALUE NOTED. VALUE IS CONSISTENT WITH PREVIOUSLY REPORTED/CALLED VALUE (NOTE) Elevated high sensitivity troponin I (hsTnI) values and significant  changes across serial measurements may suggest ACS but many other  chronic and acute conditions are known to elevate hsTnI  results.  Refer to the "Links" section for chest pain algorithms and additional  guidance. Performed at [facility], [place] [address] [place], [place] [address]   Comprehensive metabolic panel  Status: Abnormal   Collection Time: 06/06/22  6:19 PM  Result Value Ref Range   Sodium 143 135 - 145 mmol/L   Potassium 3.7 3.5 - 5.1 mmol/L   Chloride 100 98 - 111 mmol/L   CO2 33 (H) 22 - 32 mmol/L   Glucose, Bld 113 (H) 70 - 99 mg/dL    Comment: Glucose reference range applies only to samples taken after fasting for at least 8 hours.   BUN 25 (H) 8 - 23 mg/dL   Creatinine, Ser 6.46 (H) 0.61 - 1.24 mg/dL   Calcium 8.5 (L) 8.9 - 10.3 mg/dL   Total Protein 5.8 (L) 6.5 - 8.1 g/dL   Albumin 2.8 (L) 3.5 - 5.0 g/dL   AST 21 15 - 41 U/L   ALT 11 0 - 44 U/L   Alkaline Phosphatase 114 38 - 126 U/L   Total Bilirubin 0.5 0.3 - 1.2 mg/dL   GFR, Estimated 8 (L) >60 mL/min    Comment: (NOTE) Calculated using the CKD-EPI Creatinine Equation (2021)    Anion gap 10 5 - 15    Comment: Performed at [facility], [place] [address], [place], [place] [address]  Lipase, blood     Status: None   Collection Time: 06/06/22  6:19 PM  Result Value Ref Range   Lipase 46 11 - 51 U/L    Comment: Performed at [facility] [address] [place], [place] [address]  Brain natriuretic peptide     Status: Abnormal   Collection Time: 06/06/22  8:10 PM  Result Value Ref Range   B Natriuretic Peptide 3,250.3 (H) 0.0 - 100.0 pg/mL    Comment: Performed at [facility] [address] [place], [place] [address]  CBG monitoring, ED     Status: Abnormal   Collection Time: 06/06/22 11:08 PM  Result Value Ref Range   Glucose-Capillary 162 (H) 70 - 99 mg/dL    Comment: Glucose reference range applies only to samples taken after fasting for at least 8 hours.  Troponin I (High Sensitivity)     Status: Abnormal   Collection Time: 06/06/22 11:15 PM  Result Value Ref Range   Troponin I (High  Sensitivity) 589 (HH) <18 ng/L    Comment: DELTA CHECK NOTED CRITICAL VALUE NOTED. VALUE IS CONSISTENT WITH PREVIOUSLY REPORTED/CALLED VALUE (NOTE) Elevated high sensitivity troponin I (hsTnI) values and significant  changes across serial measurements may suggest ACS but many other  chronic and acute conditions are known to elevate hsTnI results.  Refer to the "Links" section for chest pain algorithms and additional  guidance. Performed at [facility], [place] [address], [place], [place] [address]   CBC     Status: Abnormal   Collection Time: 06/07/22  2:59 AM  Result Value Ref Range   WBC 5.6 4.0 - 10.5 K/uL   RBC 2.40 (L) 4.22 - 5.81 MIL/uL   Hemoglobin 8.0 (L) 13.0 - 17.0 g/dL   HCT 24.2 (L) 39.0 - 52.0 %   MCV 100.8 (H) 80.0 - 100.0 fL   MCH 33.3 26.0 - 34.0 pg   MCHC 33.1 30.0 - 36.0 g/dL   RDW 17.4 (H) 11.5 - 15.5 %   Platelets 179 150 - 400 K/uL   nRBC 0.0 0.0 - 0.2 %    Comment: Performed at [facility], [place] [address], [place], [place] [address]  Renal function panel     Status: Abnormal   Collection Time:  06/07/22  2:59 AM  Result Value Ref Range   Sodium 141 135 - 145 mmol/L   Potassium 3.5 3.5 - 5.1 mmol/L   Chloride 96 (L) 98 - 111 mmol/L   CO2 30 22 - 32 mmol/L   Glucose, Bld 95 70 - 99 mg/dL    Comment: Glucose reference range applies only to samples taken after fasting for at least 8 hours.   BUN 27 (H) 8 - 23 mg/dL   Creatinine, Ser 7.03 (H) 0.61 - 1.24 mg/dL   Calcium 8.6 (L) 8.9 - 10.3 mg/dL   Phosphorus 5.0 (H) 2.5 - 4.6 mg/dL   Albumin 2.7 (L) 3.5 - 5.0 g/dL   GFR, Estimated 7 (L) >60 mL/min    Comment: (NOTE) Calculated using the CKD-EPI Creatinine Equation (2021)    Anion gap 15 5 - 15    Comment: Performed at [facility] [address], [place], [place] [address]  CBG monitoring, ED     Status: Abnormal   Collection Time: 06/07/22  7:42 AM  Result Value Ref Range   Glucose-Capillary 114 (H) 70 - 99 mg/dL    Comment: Glucose  reference range applies only to samples taken after fasting for at least 8 hours.  CBG monitoring, ED     Status: Abnormal   Collection Time: 06/07/22 11:36 AM  Result Value Ref Range   Glucose-Capillary 111 (H) 70 - 99 mg/dL    Comment: Glucose reference range applies only to samples taken after fasting for at least 8 hours.  Hepatitis B surface antigen     Status: None   Collection Time: 06/07/22 12:39 PM  Result Value Ref Range   Hepatitis B Surface Ag NON REACTIVE NON REACTIVE    Comment: Performed at [facility] [address] [place], [place] [address]  Hepatitis B surface antibody     Status: Abnormal   Collection Time: 06/07/22 12:39 PM  Result Value Ref Range   Hep B S Ab Reactive (A) NON REACTIVE    Comment: (NOTE) Consistent with immunity, greater than 9.9 mIU/mL.  Performed at [facility], [place] [address] [place], [place] [address]   Hepatitis B core antibody, total     Status: None   Collection Time: 06/07/22 12:39 PM  Result Value Ref Range   Hep B Core Total Ab NON REACTIVE NON REACTIVE    Comment: Performed at [facility] [address], [place], [place] [address]  Hepatitis C antibody     Status: None   Collection Time: 06/07/22 12:39 PM  Result Value Ref Range   HCV Ab NON REACTIVE NON REACTIVE    Comment: (NOTE) Nonreactive HCV antibody screen is consistent with no HCV infections,  unless recent infection is suspected or other evidence exists to indicate HCV infection.  Performed at [facility], [place] [address] [place], [place] [address]    *Note: Due to a large number of results and/or encounters for the requested time period, some results have not been displayed. A complete set of results can be found in Results Review.   ECHOCARDIOGRAM COMPLETE  Result Date: 06/07/2022    ECHOCARDIOGRAM REPORT   Patient Name:   [name] [place] Date of Exam: 06/07/2022 Medical Rec #:  [id number]      Height:       67.0 in Accession #:     [id number]     Weight:       156.6 lb Date  of Birth:  [birth year]      BSA:          1.823 m Patient Age:    45 years       BP:           134/90 mmHg Patient Gender: M              HR:           52 bpm. Exam Location:  Inpatient Procedure: 2D Echo, Color Doppler and Cardiac Doppler Indications:    Chest pain/Elevated troponin  History:        Patient has prior history of Echocardiogram examinations, most                 recent 11/07/2020. CAD, Hx of AAA, Signs/Symptoms:Alzheimer's;                 Risk Factors:Diabetes, Dyslipidemia, Hypertension and Sleep                 Apnea.  Sonographer:    Eartha Inch Referring Phys: [id number] [place]  Sonographer Comments: Technically difficult study due to poor echo windows. Image acquisition challenging due to patient body habitus. Pt scanned supine. Could not be positioned in left lateral decubitus due to back pain. IMPRESSIONS  1. LV apical false tendon (normal variant). Left ventricular ejection fraction, by estimation, is 45 to 50%. The left ventricle has mildly decreased function. The left ventricle demonstrates global hypokinesis. There is mild left ventricular hypertrophy. Left ventricular diastolic parameters are consistent with Grade I diastolic dysfunction (impaired relaxation). Elevated left ventricular end-diastolic pressure. The E/e' is 34.  2. Right ventricular systolic function is normal. The right ventricular size is normal. There is mildly elevated pulmonary artery systolic pressure. The estimated right ventricular systolic pressure is 82.5 mmHg.  3. The mitral valve is abnormal. Mild to moderate mitral valve regurgitation.  4. The tricuspid valve is abnormal. Tricuspid valve regurgitation is mild to moderate.  5. The aortic valve is tricuspid. Aortic valve regurgitation is not visualized.  6. The inferior vena cava is dilated in size with <50% respiratory variability, suggesting right atrial pressure of 15 mmHg. Comparison(s): Changes from prior  study are noted. 11/07/2020: LVEF 55-60%, RVSP 40 mmHg, trivial MR. FINDINGS  Left Ventricle: LV apical false tendon (normal variant). Left ventricular ejection fraction, by estimation, is 45 to 50%. The left ventricle has mildly decreased function. The left ventricle demonstrates global hypokinesis. The left ventricular internal  cavity size was normal in size. There is mild left ventricular hypertrophy. Left ventricular diastolic parameters are consistent with Grade I diastolic dysfunction (impaired relaxation). Elevated left ventricular end-diastolic pressure. The E/e' is 10. Right Ventricle: The right ventricular size is normal. No increase in right ventricular wall thickness. Right ventricular systolic function is normal. There is mildly elevated pulmonary artery systolic pressure. The tricuspid regurgitant velocity is 2.52  m/s, and with an assumed right atrial pressure of 15 mmHg, the estimated right ventricular systolic pressure is 05.3 mmHg. Left Atrium: Left atrial size was normal in size. Right Atrium: Right atrial size was normal in size. Pericardium: There is no evidence of pericardial effusion. Mitral Valve: The mitral valve is abnormal. There is mild thickening of the anterior and posterior mitral valve leaflet(s). Mild mitral annular calcification. Mild to moderate mitral valve regurgitation. Tricuspid Valve: The tricuspid valve is abnormal. Tricuspid valve regurgitation is mild to moderate. Aortic Valve: The aortic valve is tricuspid. Aortic valve regurgitation is not visualized. Pulmonic Valve:  The pulmonic valve was normal in structure. Pulmonic valve regurgitation is not visualized. Aorta: The aortic root and ascending aorta are structurally normal, with no evidence of dilitation. Venous: The inferior vena cava is dilated in size with less than 50% respiratory variability, suggesting right atrial pressure of 15 mmHg. IAS/Shunts: No atrial level shunt detected by color flow Doppler.  LEFT VENTRICLE  PLAX 2D LVIDd:         3.60 cm     Diastology LVIDs:         3.20 cm     LV e' medial:    5.33 cm/s LV PW:         1.20 cm     LV E/e' medial:  21.2 LV IVS:        1.20 cm     LV e' lateral:   6.53 cm/s LVOT diam:     2.10 cm     LV E/e' lateral: 17.3 LV SV:         73 LV SV Index:   40 LVOT Area:     3.46 cm  LV Volumes (MOD) LV vol d, MOD A2C: 89.0 ml LV vol s, MOD A2C: 52.5 ml LV SV MOD A2C:     36.5 ml RIGHT VENTRICLE             IVC RV S prime:     14.50 cm/s  IVC diam: 2.10 cm TAPSE (M-mode): 2.6 cm LEFT ATRIUM             Index        RIGHT ATRIUM           Index LA diam:        3.60 cm 1.98 cm/m   RA Area:     17.80 cm LA Vol (A2C):   45.1 ml 24.75 ml/m  RA Volume:   45.10 ml  24.75 ml/m LA Vol (A4C):   49.8 ml 27.32 ml/m LA Biplane Vol: 49.7 ml 27.27 ml/m  AORTIC VALVE LVOT Vmax:   95.00 cm/s LVOT Vmean:  62.500 cm/s LVOT VTI:    0.211 m  AORTA Ao Root diam: 3.40 cm Ao Asc diam:  3.30 cm MITRAL VALVE                  TRICUSPID VALVE MV Area (PHT): 3.74 cm       TR Peak grad:   25.4 mmHg MV Decel Time: 203 msec       TR Mean grad:   16.0 mmHg MR Peak grad:    141.1 mmHg   TR Vmax:        252.00 cm/s MR Mean grad:    102.0 mmHg   TR Vmean:       193.0 cm/s MR Vmax:         594.00 cm/s MR Vmean:        475.0 cm/s   SHUNTS MR PISA:         1.57 cm     Systemic VTI:  0.21 m MR PISA Eff ROA: 10 mm       Systemic Diam: 2.10 cm MR PISA Radius:  0.50 cm MV E velocity: 113.00 cm/s MV A velocity: 126.00 cm/s MV E/A ratio:  0.90 Lyman Bishop MD Electronically signed by Lyman Bishop MD Signature Date/Time: 06/07/2022/4:00:16 PM    Final    CT Angio Chest PE W and/or Wo Contrast  Result Date: 06/06/2022 CLINICAL DATA:  Chest pain short of breath left  upper quadrant abdominal pain EXAM: CT ANGIOGRAPHY CHEST, ABDOMEN AND PELVIS TECHNIQUE: Multidetector CT imaging through the chest, abdomen and pelvis was performed using the standard protocol during bolus administration of intravenous contrast. Multiplanar  reconstructed images and MIPs were obtained and reviewed to evaluate the vascular anatomy. RADIATION DOSE REDUCTION: This exam was performed according to the departmental dose-optimization program which includes automated exposure control, adjustment of the mA and/or kV according to patient size and/or use of iterative reconstruction technique. CONTRAST:  181m OMNIPAQUE IOHEXOL 350 MG/ML SOLN COMPARISON:  Radiograph 06/04/2022, CT abdomen pelvis 04/10/2022, 10/05/2021, CT chest 01/12/2021, CT chest 10/30/2017 FINDINGS: CTA CHEST FINDINGS Cardiovascular: Satisfactory opacification of the pulmonary arteries to the segmental level. No evidence of pulmonary embolism. Advanced aortic atherosclerosis. No aneurysm. Extensive coronary vascular calcification. Borderline cardiac size. No pericardial effusion. Mediastinum/Nodes: Midline trachea. No thyroid mass. No suspicious lymph nodes. Esophagus within normal limits. Calcified mediastinal and hilar nodes consistent with prior granulomatous disease. Lungs/Pleura: Mild emphysema. Small bilateral pleural effusions. Bandlike density at the left lung base likely reflects atelectasis. Partial posterior consolidation, atelectasis versus mild pneumonia. 5 mm ground-glass nodule at the lingula without change and felt benign, no follow-up imaging is recommended Musculoskeletal: Sternum is intact. Mild chronic compression deformities at T1, T3, and T8. Review of the MIP images confirms the above findings. 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 Diminutive heavily calcified native distal aorta and iliac vessels. Heavy calcification of the proximal abdominal aorta without aneurysm. Celiac: Appears diminutive. 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 Variant anatomy. SMA: Heavily calcified proximal SMA limits evaluation for stenosis. Splenic and right hepatic arteries appear to take their origin from the SMA. No  aneurysm, occlusion or dissection. Renals: Single right and single left renal arteries also heavily calcified. Suspect at least moderate stenosis of the proximal arteries. IMA: Poorly visualized due to artifact from surgical clips. Could potentially be chronically occluded, vascular flow enhancement better visualized on the delayed venous images. Inflow: Status post aortoiliac bypass with patency. Heavily calcified diminutive native vessels. Heavily calcified internal iliac vessels. Veins: No obvious venous abnormality within the limitations of this arterial phase study. Review of the MIP images confirms the above findings. NON-VASCULAR Hepatobiliary: No calcified gallstone. Multiple hypodense subcentimeter liver lesions, too small to further characterize. No biliary dilatation Pancreas: Unremarkable. No pancreatic ductal dilatation or surrounding inflammatory changes. Spleen: Normal in size without focal abnormality. Adrenals/Urinary Tract: Adrenal glands are normal. Atrophic kidneys without hydronephrosis. Multiple renal cystic lesions without highly suspicious lesion, no specific imaging follow-up is recommended. The urinary bladder is decompressed but slightly thick-walled and indistinct. Stomach/Bowel: The stomach is nonenlarged. No dilated small bowel. No acute bowel wall thickening. Lymphatic: No suspicious lymph nodes Reproductive: Prostate is unremarkable. Other: Negative for pelvic effusion or free air Musculoskeletal: Hardware in the right femur with artifact. Treated compression deformity at L3. Multiple chronic compression fractures involving L1, L2, L4 and L5. Review of the MIP images confirms the above findings. IMPRESSION: 1. Negative for acute pulmonary embolus. 2. Small pleural effusions. Mild airspace disease at left lung base, favor atelectasis and or scar over pneumonia 3. No CT evidence for acute intra-abdominal or pelvic abnormality. Atrophic kidneys without hydronephrosis. 4. 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 Electronically Signed   By: KDonavan FoilM.D.   On: 06/06/2022 19:47  CT Angio Abd/Pel W and/or Wo Contrast  Result Date: 06/06/2022 CLINICAL DATA:  Chest pain short of breath left upper quadrant abdominal pain EXAM: CT ANGIOGRAPHY CHEST, ABDOMEN AND PELVIS TECHNIQUE: Multidetector CT imaging through the chest, abdomen and pelvis was performed using the standard protocol during bolus administration of intravenous contrast. Multiplanar reconstructed images and MIPs were obtained and reviewed to evaluate the vascular anatomy. RADIATION DOSE REDUCTION: This exam was performed according to the departmental dose-optimization program which includes automated exposure control, adjustment of the mA and/or kV according to patient size and/or use of iterative reconstruction technique. CONTRAST:  158m OMNIPAQUE IOHEXOL 350 MG/ML SOLN COMPARISON:  Radiograph 06/04/2022, CT abdomen pelvis 04/10/2022, 10/05/2021, CT chest 01/12/2021, CT chest 10/30/2017 FINDINGS: CTA CHEST FINDINGS Cardiovascular: Satisfactory opacification of the pulmonary arteries to the segmental level. No evidence of pulmonary embolism. Advanced aortic atherosclerosis. No aneurysm. Extensive coronary vascular calcification. Borderline cardiac size. No pericardial effusion. Mediastinum/Nodes: Midline trachea. No thyroid mass. No suspicious lymph nodes. Esophagus within normal limits. Calcified mediastinal and hilar nodes consistent with prior granulomatous disease. Lungs/Pleura: Mild emphysema. Small bilateral pleural effusions. Bandlike density at the left lung base likely reflects atelectasis. Partial posterior consolidation, atelectasis versus mild pneumonia. 5 mm ground-glass nodule at the lingula without change and felt benign, no follow-up imaging is recommended Musculoskeletal:  Sternum is intact. Mild chronic compression deformities at T1, T3, and T8. Review of the MIP images confirms the above findings. 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 Diminutive heavily calcified native distal aorta and iliac vessels. Heavy calcification of the proximal abdominal aorta without aneurysm. Celiac: Appears diminutive. 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 Variant anatomy. SMA: Heavily calcified proximal SMA limits evaluation for stenosis. Splenic and right hepatic arteries appear to take their origin from the SMA. No aneurysm, occlusion or dissection. Renals: Single right and single left renal arteries also heavily calcified. Suspect at least moderate stenosis of the proximal arteries. IMA: Poorly visualized due to artifact from surgical clips. Could potentially be chronically occluded, vascular flow enhancement better visualized on the delayed venous images. Inflow: Status post aortoiliac bypass with patency. Heavily calcified diminutive native vessels. Heavily calcified internal iliac vessels. Veins: No obvious venous abnormality within the limitations of this arterial phase study. Review of the MIP images confirms the above findings. NON-VASCULAR Hepatobiliary: No calcified gallstone. Multiple hypodense subcentimeter liver lesions, too small to further characterize. No biliary dilatation Pancreas: Unremarkable. No pancreatic ductal dilatation or surrounding inflammatory changes. Spleen: Normal in size without focal abnormality. Adrenals/Urinary Tract: Adrenal glands are normal. Atrophic kidneys without hydronephrosis. Multiple renal cystic lesions without highly suspicious lesion, no specific imaging follow-up is recommended. The urinary bladder is decompressed but slightly thick-walled and indistinct. Stomach/Bowel: The stomach is nonenlarged. No dilated small bowel. No acute bowel wall thickening. Lymphatic: No  suspicious lymph nodes Reproductive: Prostate is unremarkable. Other: Negative for pelvic effusion or free air Musculoskeletal: Hardware in the right femur with artifact. Treated compression deformity at L3. Multiple chronic compression fractures involving L1, L2, L4 and L5. Review of the MIP images confirms the above findings. IMPRESSION: 1. Negative for acute pulmonary embolus. 2. Small pleural effusions. Mild airspace disease at left lung base, favor atelectasis and or scar over pneumonia 3. No CT evidence for acute intra-abdominal or pelvic abnormality. Atrophic kidneys without hydronephrosis. 4. 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 Patient is status post aorto bi femoral bypass with patency. Variant branching anatomy of the  celiac and SMA. Electronically Signed   By: Donavan Foil M.D.   On: 06/06/2022 19:47    Pending Labs Unresulted Labs (From admission, onward)     Start     Ordered   06/07/22 1202  Hepatitis B surface antibody,quantitative  (New Admission Hemo Labs (Hepatitis B))  Once,   R        06/07/22 1202            Vitals/Pain Today's Vitals   06/07/22 1730 06/07/22 1731 06/07/22 1732 06/07/22 1750  BP: 124/73     Pulse: 74 71 73   Resp: '15 20 20   '$ Temp:    98.3 F (36.8 C)  TempSrc:    Oral  SpO2: 99% 93% 100%   PainSc:        Isolation Precautions No active isolations  Medications Medications  allopurinol (ZYLOPRIM) tablet 100 mg (100 mg Oral Given 06/07/22 0954)  simvastatin (ZOCOR) tablet 20 mg (20 mg Oral Given 06/06/22 2310)  donepezil (ARICEPT) tablet 10 mg (10 mg Oral Given 06/06/22 2310)  memantine (NAMENDA) tablet 10 mg (10 mg Oral Given 06/07/22 0954)  QUEtiapine (SEROQUEL) tablet 25 mg (has no administration in time range)  levothyroxine (SYNTHROID) tablet 50 mcg (50 mcg Oral Given 06/07/22 0607)  pantoprazole (PROTONIX) EC tablet 40 mg (40 mg Oral Given 06/07/22 0954)  feeding supplement (NEPRO CARB STEADY) liquid 237 mL  (237 mLs Oral Not Given 06/07/22 0754)  acetaminophen (TYLENOL) tablet 650 mg (has no administration in time range)    Or  acetaminophen (TYLENOL) suppository 650 mg (has no administration in time range)  insulin aspart (novoLOG) injection 0-6 Units ( Subcutaneous Not Given 06/07/22 1138)  insulin aspart (novoLOG) injection 0-5 Units ( Subcutaneous Not Given 06/06/22 2311)  sevelamer carbonate (RENVELA) tablet 2,400 mg (2,400 mg Oral Given 06/07/22 1426)    And  sevelamer carbonate (RENVELA) tablet 1,600 mg (1,600 mg Oral Given 06/07/22 0954)  Darbepoetin Alfa (ARANESP) injection 150 mcg (has no administration in time range)  iohexol (OMNIPAQUE) 350 MG/ML injection 100 mL (100 mLs Intravenous Contrast Given 06/06/22 1845)  aspirin chewable tablet 324 mg (324 mg Oral Given 06/06/22 1918)  QUEtiapine (SEROQUEL) tablet 25 mg (25 mg Oral Given 06/06/22 2054)  melatonin tablet 3 mg (3 mg Oral Given 06/06/22 2310)  ondansetron (ZOFRAN) injection 4 mg (4 mg Intravenous Given 06/07/22 1141)    Mobility walks with device High fall risk   Focused Assessments Cardiac Assessment Handoff:    Lab Results  Component Value Date   CKTOTAL 54 06/14/2016   TROPONINI <0.03 01/13/2018   No results found for: "DDIMER" Does the Patient currently have chest pain? No    , Renal Assessment Handoff:  Hemodialysis Schedule: Hemodialysis Schedule: Monday/Wednesday/Friday Last Hemodialysis date and time:    Restricted appendage: right arm  , Pulmonary Assessment Handoff:  Lung sounds:   O2 Device: Room Air      R Recommendations: See Admitting Provider Note  Report given to:   Additional Notes: Patient's wife is at bedside. Has hx of dementia and is oriented to self and place. Patient swallows all pills and is scheduled to have dialysis tomorrow.

## 2022-06-07 NOTE — Progress Notes (Signed)
  Echocardiogram 2D Echocardiogram has been performed.  [name] 06/07/2022, 2:30 PM

## 2022-06-07 NOTE — Consult Note (Signed)
[facility] Renal Consultation Note    Indication for Consultation:  Management of ESRD/hemodialysis, anemia, hypertension/volume, and secondary hyperparathyroidism. PCP:  HPI: [name] is a 81 y.o. male with ESRD, T2DM, hypotension, CAD, and dementia who was admitted with NSTEMI.   Hard to get history from patient d/t dementia. Was on vacation last week at [facility]. Since coming back has had generalized lower abdominal pain. Went to ED on 9/11 - Hgb 8.6, but otherwise negative evaluation. FOBT negative. D/c home. Was seen by [facility] yesterday afternoon for ongoing LLQ abdominal pain, reduced appetite, and was complaining of L chest pain. Was noted to be tachycardic at the GI office  -> sent to ED again for cardiac evaluation. Labs in ED with Hgb 9.2, Trop 526 -> 643 -> 589. EKG without ST changes. Cardiology consulted and recommended admission. Echo pending, plan is for medical management at this time.  Seen in ED bed this morning. Denies CP at the moment. Does complain about generalized abdominal pain, pointing to both epigastric area and LLQ area. No fever, chills, N/V/D.  Dialyzes on MWF schedule at [facility] clinic. He had a full HD yesterday without noted issues. Uses RUE AVF as his access.  Past Medical History:  Diagnosis Date   Allergy    Alzheimer's disease ([place]) 01/19/2021   Anemia    Arthritis    Cataract    bil cateracts removed   Coronary artery disease    Dementia arising in the senium and presenium ([place])    Diabetes mellitus    Type 2   Diverticulitis    ED (erectile dysfunction)    Elevated homocysteine    ESRD (end stage renal disease) on dialysis ([place]) 03/2015   M-W-F dialysis   GERD (gastroesophageal reflux disease)    pepto    Gout    Hiatal hernia    Hyperlipidemia    Hypertension    Hypothyroidism    PVD (peripheral vascular disease) ([place])    has plastic aorta   Renal insufficiency    Sleep apnea    does not wear c-pap    Past Surgical History:  Procedure Laterality Date   ANGIOPLASTY Right 01/26/2021   Procedure: ANGIOPLASTY RIGHT SUBCLAVIAN VEIN;  Surgeon: Serafina Mitchell, MD;  Location: [place];  Service: Vascular;  Laterality: Right;   aortobifemoral bypass     AV FISTULA PLACEMENT Left 12/01/2013   Procedure: ARTERIOVENOUS (AV) FISTULA CREATION- LEFT BRACHIOCEPHALIC;  Surgeon: Angelia Mould, MD;  Location: [place];  Service: Vascular;  Laterality: Left;   [place] Right 07/27/2014   Procedure: BASCILIC VEIN TRANSPOSITION;  Surgeon: Angelia Mould, MD;  Location: [place];  Service: Vascular;  Laterality: Right;   BRAIN SURGERY  07/22/2018   BREAST SURGERY     left - granulomatous mastitis   BURR HOLE Bilateral 07/22/2018   Procedure: BILATERAL BURR HOLES;  Surgeon: Kristeen Miss, MD;  Location: [place];  Service: Neurosurgery;  Laterality: Bilateral;   COLONOSCOPY     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   ENTEROSCOPY N/A 06/03/2018   Procedure: ENTEROSCOPY;  Surgeon: Lavena Bullion, DO;  Location: [facility];  Service: Gastroenterology;  Laterality: N/A;   ENTEROSCOPY N/A 11/20/2018   Procedure: ENTEROSCOPY;  Surgeon: Rush Landmark Telford Nab., MD;  Location: [place];  Service: Gastroenterology;  Laterality: N/A;   ENTEROSCOPY N/A 12/03/2019   Procedure: ENTEROSCOPY;  Surgeon: Lavena Bullion, DO;  Location: [facility];  Service: Gastroenterology;  Laterality: N/A;  push enteroscopy   ENTEROSCOPY N/A 07/04/2021   Procedure: ENTEROSCOPY;  Surgeon: Sharyn Creamer, MD;  Location: [facility] ENDOSCOPY;  Service: Gastroenterology;  Laterality: N/A;   ENTEROSCOPY N/A 11/05/2021   Procedure: ENTEROSCOPY;  Surgeon: Daryel November, MD;  Location: [facility] ENDOSCOPY;  Service: Gastroenterology;  Laterality: N/A;   EYE SURGERY Bilateral    cataracts   FISTULOGRAM Right 01/26/2021   Procedure: FISTULOGRAM RIGHT;  Surgeon: Serafina Mitchell, MD;  Location: [facility] OR;  Service:  Vascular;  Laterality: Right;   HEMODIALYSIS INPATIENT  01/17/2018       HOT HEMOSTASIS N/A 06/03/2018   Procedure: HOT HEMOSTASIS (ARGON PLASMA COAGULATION/BICAP);  Surgeon: Lavena Bullion, DO;  Location: [facility] ENDOSCOPY;  Service: Gastroenterology;  Laterality: N/A;   HOT HEMOSTASIS N/A 11/20/2018   Procedure: HOT HEMOSTASIS (ARGON PLASMA COAGULATION/BICAP);  Surgeon: Irving Copas., MD;  Location: [place];  Service: Gastroenterology;  Laterality: N/A;   HOT HEMOSTASIS N/A 12/03/2019   Procedure: HOT HEMOSTASIS (ARGON PLASMA COAGULATION/BICAP);  Surgeon: Lavena Bullion, DO;  Location: [facility];  Service: Gastroenterology;  Laterality: N/A;   INTRAMEDULLARY (IM) NAIL INTERTROCHANTERIC Right 11/08/2020   Procedure: INTRAMEDULLARY (IM) NAIL INTERTROCHANTRIC;  Surgeon: Erle Crocker, MD;  Location: [place];  Service: Orthopedics;  Laterality: Right;   REVISION OF ARTERIOVENOUS GORETEX GRAFT Right 09/01/2019   Procedure: REVISION OF ARTERIOVENOUS FISTULA RIGHT ARM;  Surgeon: Serafina Mitchell, MD;  Location: [place];  Service: Vascular;  Laterality: Right;   REVISON OF ARTERIOVENOUS FISTULA Left 02/09/2014   Procedure: REVISON OF LEFT ARTERIOVENOUS FISTULA - RESECTION OF RENDUNDANT VEIN;  Surgeon: Angelia Mould, MD;  Location: [place];  Service: Vascular;  Laterality: Left;   REVISON OF ARTERIOVENOUS FISTULA Right 01/26/2021   Procedure: REVISON OF ARTERIOVENOUS FISTULA RIGHT;  Surgeon: Serafina Mitchell, MD;  Location: [place];  Service: Vascular;  Laterality: Right;   SBO with lysis adhesions     SHUNTOGRAM Left 04/19/2014   Procedure: FISTULOGRAM;  Surgeon: Angelia Mould, MD;  Location: [facility] CATH LAB;  Service: Cardiovascular;  Laterality: Left;   UNILATERAL UPPER EXTREMEITY ANGIOGRAM N/A 07/12/2014   Procedure: UNILATERAL UPPER Anselmo Rod;  Surgeon: Angelia Mould, MD;  Location: [facility] CATH LAB;  Service: Cardiovascular;  Laterality: N/A;   Family History   Problem Relation Age of Onset   Aneurysm Mother        brain   Heart disease Father    Stroke Father    Hypertension Father    Diabetes Father    Dementia Neg Hx    Colon cancer Neg Hx    Esophageal cancer Neg Hx    Pancreatic cancer Neg Hx    Prostate cancer Neg Hx    Rectal cancer Neg Hx    Stomach cancer Neg Hx    Social History:  reports that he quit smoking about 27 years ago. His smoking use included cigarettes. He has never been exposed to tobacco smoke. He has never used smokeless tobacco. He reports that he does not drink alcohol and does not use drugs.  ROS: As per HPI otherwise negative.  Physical Exam: Vitals:   06/07/22 0600 06/07/22 0609 06/07/22 1000 06/07/22 1030  BP: 130/76  121/62 (!) 116/99  Pulse: 73  81 70  Resp: 19  18 12   Temp:  98 F (36.7 C) 98.2 F (36.8 C)   TempSrc:  Oral    SpO2: 98%  100% 99%  General: Well developed, in no acute distress.Room air. Chronic dementia. Head: Normocephalic, atraumatic, sclera non-icteric, mucus membranes are moist. Neck: Supple without lymphadenopathy/masses. JVD not elevated. Lungs: Clear bilaterally to auscultation without wheezes, rales, or rhonchi. Breathing is unlabored. Heart: RRR with normal S1, S2. No murmurs, rubs, or gallops appreciated. Abdomen: Soft, mildly tender to palpation across lower abdomen without guarding. Musculoskeletal:  Strength and tone appear normal for age. Lower extremities: No edema or ischemic changes, no open wounds. Neuro: Alert. Moves all extremities spontaneously. Chronic dementia. Dialysis Access: RUE AVF + bruit  Allergies  Allergen Reactions   Ambien [Zolpidem Tartrate] Other (See Comments)    Hallucinations and "felt crazy"    Penicillins Hives and Rash    Has patient had a PCN reaction causing immediate rash, facial/tongue/throat swelling, SOB or lightheadedness with hypotension: Yes Has patient had a PCN reaction causing severe rash involving mucus membranes or  skin necrosis: Yes Has patient had a PCN reaction that required hospitalization: No Has patient had a PCN reaction occurring within the last 10 years: No If all of the above answers are "NO", then may proceed with Cephalosporin use.    Prior to Admission medications   Medication Sig Start Date End Date Taking? Authorizing Provider  acetaminophen (TYLENOL) 325 MG tablet Take 1-2 tablets (325-650 mg total) by mouth every 4 (four) hours as needed for mild pain. Patient taking differently: Take 650 mg by mouth in the morning and at bedtime. 08/01/18  Yes Love, Ivan Anchors, PA-C  albuterol (VENTOLIN HFA) 108 (90 Base) MCG/ACT inhaler Inhale 1-2 puffs into the lungs daily as needed for wheezing or shortness of breath.   Yes [provider]  allopurinol (ZYLOPRIM) 100 MG tablet Take 100 mg by mouth daily.   Yes [provider]  camphor-menthol Timoteo Ace) lotion Apply 1 application topically 4 (four) times daily as needed for itching.    Yes [provider]  Carboxymethylcellulose Sodium (THERATEARS) 0.25 % SOLN Place 1 drop into both eyes 3 (three) times daily as needed (for dryness).   Yes [provider]  cetirizine (ZYRTEC) 10 MG tablet Take 10 mg by mouth daily as needed for allergies.   Yes [provider]  cyanocobalamin (,VITAMIN B-12,) 1000 MCG/ML injection Inject 1 mL (1,000 mcg total) into the muscle every 30 (thirty) days. 04/13/22  Yes Baxley, Cresenciano Lick, MD  donepezil (ARICEPT) 10 MG tablet Take 1 tablet (10 mg total) by mouth at bedtime. 09/26/21  Yes Melvenia Beam, MD  fluticasone (FLONASE) 50 MCG/ACT nasal spray Place 1 spray into both nostrils daily. Patient taking differently: Place 2 sprays into both nostrils daily as needed for allergies. 10/08/14  Yes Mikhail, Maryann, DO  FORTEO 600 MCG/2.4ML SOPN INJECT 20 MCG UNDER THE SKIN DAILY (DISCARD 28 DAYS AFTER INITIAL USE) Patient taking differently: Inject 20 mcg into the skin at bedtime. (DISCARD 28  DAYS AFTER INITIAL USE) 05/14/22  Yes Elayne Snare, MD  hydrocortisone 2.5 % cream Apply 1 application topically 3 (three) times daily as needed (skin irritation (legs & arms)).  01/13/19  Yes [provider]  levothyroxine (SYNTHROID, LEVOTHROID) 50 MCG tablet Take 1 tablet (50 mcg total) by mouth daily. Patient taking differently: Take 50 mcg by mouth daily before breakfast. 08/06/14  Yes Baxley, Cresenciano Lick, MD  lidocaine (LIDODERM) 5 % Place 1 patch onto the skin daily as needed (back pain- after removal of former one). 11/12/19  Yes [provider]  memantine (NAMENDA) 10 MG tablet Take 1  tablet (10 mg total) by mouth 2 (two) times daily. 09/26/21  Yes Melvenia Beam, MD  midodrine (PROAMATINE) 10 MG tablet Take one pill prior to hemodialysis on MWF Patient taking differently: Take 10 mg by mouth See admin instructions. Take 10 mg by mouth prior to hemodialysis on Mon/Wed/Fri 08/13/18  Yes Love, Ivan Anchors, PA-C  Nutritional Supplements (FEEDING SUPPLEMENT, NEPRO CARB STEADY,) LIQD Take 237 mLs by mouth in the morning.   Yes [provider]  omeprazole (PRILOSEC) 40 MG capsule Take 1 capsule (40 mg total) by mouth daily. Patient taking differently: Take 40 mg by mouth daily before breakfast. 03/13/18  Yes Esterwood, Amy S, PA-C  QUEtiapine (SEROQUEL) 25 MG tablet Take 1 tablet (25 mg total) by mouth at bedtime. 05/22/22  Yes Baxley, Cresenciano Lick, MD  repaglinide (PRANDIN) 0.5 MG tablet TAKE 1 TABLET(0.5 MG) BY MOUTH TWICE DAILY BEFORE A MEAL Patient taking differently: Take 0.5 mg by mouth See admin instructions. Take 0.5 mg by mouth at 5 pm (AFTER dialysis) only on Mon/Wed/Fri 05/29/22  Yes Elayne Snare, MD  sevelamer carbonate (RENVELA) 800 MG tablet Take 1,600-2,400 mg by mouth See admin instructions. Take 2,400 mg by mouth three times a day with meals and 1,600 mg with snacks   Yes [provider]  simvastatin (ZOCOR) 20 MG tablet Take 20 mg by mouth at bedtime.   Yes [provider]  TRADJENTA 5 MG TABS tablet Take 1 tablet (5 mg total) by mouth daily. 11/16/21  Yes Elayne Snare, MD  Blood Glucose Monitoring Suppl (FREESTYLE FREEDOM LITE) w/Device KIT Use to check blood sugar 2 times per day dx code E11.65 10/21/18   Elayne Snare, MD  dicyclomine (BENTYL) 10 MG capsule Take 1 capsule (10 mg total) by mouth every 8 (eight) hours as needed for spasms. Patient not taking: Reported on 06/06/2022 12/12/21   Cirigliano, Luanna Salk V, DO  doxycycline (VIBRAMYCIN) 100 MG capsule Take 1 capsule (100 mg total) by mouth 2 (two) times daily. Patient not taking: Reported on 06/06/2022 04/10/22   Nelta Numbers, MD  glucose blood (FREESTYLE LITE) test strip USE AS DIRECTED THREE TIMES DAILY 02/16/22   Elayne Snare, MD  Lancets (FREESTYLE) lancets Use as instructed to check blood sugar 2 times per day dx code E11.65 02/22/16   Elayne Snare, MD  Syringe/Needle, Disp, (SYRINGE 3CC/25GX1") 25G X 1" 3 ML MISC 1 each by Does not apply route every 30 (thirty) days. 04/13/22   Elby Showers, MD   Current Facility-Administered Medications  Medication Dose Route Frequency Provider Last Rate Last Admin   acetaminophen (TYLENOL) tablet 650 mg  650 mg Oral Q6H PRN Shela Leff, MD       Or   acetaminophen (TYLENOL) suppository 650 mg  650 mg Rectal Q6H PRN Shela Leff, MD       allopurinol (ZYLOPRIM) tablet 100 mg  100 mg Oral Daily Shela Leff, MD   100 mg at 06/07/22 0954   donepezil (ARICEPT) tablet 10 mg  10 mg Oral QHS Shela Leff, MD   10 mg at 06/06/22 2310   feeding supplement (NEPRO CARB STEADY) liquid 237 mL  237 mL Oral q AM Shela Leff, MD       insulin aspart (novoLOG) injection 0-5 Units  0-5 Units Subcutaneous QHS Shela Leff, MD       insulin aspart (novoLOG) injection 0-6 Units  0-6 Units Subcutaneous TID WC Shela Leff, MD       levothyroxine (SYNTHROID) tablet 50  mcg  50 mcg Oral QAC breakfast Shela Leff, MD   50 mcg at  06/07/22 0607   memantine (NAMENDA) tablet 10 mg  10 mg Oral BID Shela Leff, MD   10 mg at 06/07/22 0954   pantoprazole (PROTONIX) EC tablet 40 mg  40 mg Oral Daily Shela Leff, MD   40 mg at 06/07/22 0954   QUEtiapine (SEROQUEL) tablet 25 mg  25 mg Oral QHS Shela Leff, MD       sevelamer carbonate (RENVELA) tablet 2,400 mg  2,400 mg Oral TID WC Shela Leff, MD   2,400 mg at 06/07/22 0745   And   sevelamer carbonate (RENVELA) tablet 1,600 mg  1,600 mg Oral With snacks Shela Leff, MD   1,600 mg at 06/07/22 0954   simvastatin (ZOCOR) tablet 20 mg  20 mg Oral QHS Shela Leff, MD   20 mg at 06/06/22 2310   Current Outpatient Medications  Medication Sig Dispense Refill   acetaminophen (TYLENOL) 325 MG tablet Take 1-2 tablets (325-650 mg total) by mouth every 4 (four) hours as needed for mild pain. (Patient taking differently: Take 650 mg by mouth in the morning and at bedtime.)     albuterol (VENTOLIN HFA) 108 (90 Base) MCG/ACT inhaler Inhale 1-2 puffs into the lungs daily as needed for wheezing or shortness of breath.     allopurinol (ZYLOPRIM) 100 MG tablet Take 100 mg by mouth daily.     camphor-menthol (SARNA) lotion Apply 1 application topically 4 (four) times daily as needed for itching.      Carboxymethylcellulose Sodium (THERATEARS) 0.25 % SOLN Place 1 drop into both eyes 3 (three) times daily as needed (for dryness).     cetirizine (ZYRTEC) 10 MG tablet Take 10 mg by mouth daily as needed for allergies.     cyanocobalamin (,VITAMIN B-12,) 1000 MCG/ML injection Inject 1 mL (1,000 mcg total) into the muscle every 30 (thirty) days. 10 mL 0   donepezil (ARICEPT) 10 MG tablet Take 1 tablet (10 mg total) by mouth at bedtime. 90 tablet 0   fluticasone (FLONASE) 50 MCG/ACT nasal spray Place 1 spray into both nostrils daily. (Patient taking differently: Place 2 sprays into both nostrils daily as needed for allergies.) 16 g 2   FORTEO 600 MCG/2.4ML SOPN  INJECT 20 MCG UNDER THE SKIN DAILY (DISCARD 28 DAYS AFTER INITIAL USE) (Patient taking differently: Inject 20 mcg into the skin at bedtime. (DISCARD 28 DAYS AFTER INITIAL USE)) 2.4 mL 2   hydrocortisone 2.5 % cream Apply 1 application topically 3 (three) times daily as needed (skin irritation (legs & arms)).      levothyroxine (SYNTHROID, LEVOTHROID) 50 MCG tablet Take 1 tablet (50 mcg total) by mouth daily. (Patient taking differently: Take 50 mcg by mouth daily before breakfast.) 90 tablet 1   lidocaine (LIDODERM) 5 % Place 1 patch onto the skin daily as needed (back pain- after removal of former one).     memantine (NAMENDA) 10 MG tablet Take 1 tablet (10 mg total) by mouth 2 (two) times daily. 180 tablet 0   midodrine (PROAMATINE) 10 MG tablet Take one pill prior to hemodialysis on MWF (Patient taking differently: Take 10 mg by mouth See admin instructions. Take 10 mg by mouth prior to hemodialysis on Mon/Wed/Fri)     Nutritional Supplements (FEEDING SUPPLEMENT, NEPRO CARB STEADY,) LIQD Take 237 mLs by mouth in the morning.     omeprazole (PRILOSEC) 40 MG capsule Take 1 capsule (40 mg total) by mouth  daily. (Patient taking differently: Take 40 mg by mouth daily before breakfast.) 30 capsule 0   QUEtiapine (SEROQUEL) 25 MG tablet Take 1 tablet (25 mg total) by mouth at bedtime. 90 tablet 0   repaglinide (PRANDIN) 0.5 MG tablet TAKE 1 TABLET(0.5 MG) BY MOUTH TWICE DAILY BEFORE A MEAL (Patient taking differently: Take 0.5 mg by mouth See admin instructions. Take 0.5 mg by mouth at 5 pm (AFTER dialysis) only on Mon/Wed/Fri) 60 tablet 2   sevelamer carbonate (RENVELA) 800 MG tablet Take 1,600-2,400 mg by mouth See admin instructions. Take 2,400 mg by mouth three times a day with meals and 1,600 mg with snacks     simvastatin (ZOCOR) 20 MG tablet Take 20 mg by mouth at bedtime.     TRADJENTA 5 MG TABS tablet Take 1 tablet (5 mg total) by mouth daily. 90 tablet 3   Blood Glucose Monitoring Suppl (FREESTYLE  FREEDOM LITE) w/Device KIT Use to check blood sugar 2 times per day dx code E11.65 1 each 0   dicyclomine (BENTYL) 10 MG capsule Take 1 capsule (10 mg total) by mouth every 8 (eight) hours as needed for spasms. (Patient not taking: Reported on 06/06/2022) 60 capsule 3   doxycycline (VIBRAMYCIN) 100 MG capsule Take 1 capsule (100 mg total) by mouth 2 (two) times daily. (Patient not taking: Reported on 06/06/2022) 20 capsule 0   glucose blood (FREESTYLE LITE) test strip USE AS DIRECTED THREE TIMES DAILY 300 strip 2   Lancets (FREESTYLE) lancets Use as instructed to check blood sugar 2 times per day dx code E11.65 100 each 3   Syringe/Needle, Disp, (SYRINGE 3CC/25GX1") 25G X 1" 3 ML MISC 1 each by Does not apply route every 30 (thirty) days. 50 each 0   Labs: Basic Metabolic Panel: Recent Labs  Lab 06/04/22 1634 06/05/22 1116 06/06/22 1819 06/07/22 0259  NA 139  --  143 141  K 3.3*  --  3.7 3.5  CL 94*  --  100 96*  CO2 30  --  33* 30  GLUCOSE 134* 101* 113* 95  BUN 23  --  25* 27*  CREATININE 5.35*  --  6.46* 7.03*  CALCIUM 8.4* 9.2 8.5* 8.6*  PHOS  --   --   --  5.0*   Liver Function Tests: Recent Labs  Lab 06/04/22 1634 06/06/22 1819 06/07/22 0259  AST 26 21  --   ALT 11 11  --   ALKPHOS 126 114  --   BILITOT 0.3 0.5  --   PROT 6.3* 5.8*  --   ALBUMIN 3.0* 2.8* 2.7*   Recent Labs  Lab 06/04/22 1634 06/06/22 1819  LIPASE 46 46   CBC: Recent Labs  Lab 06/04/22 1634 06/05/22 1344 06/06/22 1703 06/07/22 0259  WBC 6.7 6.8 6.3 5.6  NEUTROABS 4.5  --  4.0  --   HGB 8.6* 9.2* 9.2* 8.0*  HCT 25.9* 27.9* 29.4* 24.2*  MCV 98.9 99.5 105.0* 100.8*  PLT 186 207.0 274 179   CBG: Recent Labs  Lab 06/06/22 2308 06/07/22 0742 06/07/22 1136  GLUCAP 162* 114* 111*   Studies/Results: CT Angio Chest PE W and/or Wo Contrast  Result Date: 06/06/2022 CLINICAL DATA:  Chest pain short of breath left upper quadrant abdominal pain EXAM: CT ANGIOGRAPHY CHEST, ABDOMEN AND PELVIS  TECHNIQUE: Multidetector CT imaging through the chest, abdomen and pelvis was performed using the standard protocol during bolus administration of intravenous contrast. Multiplanar reconstructed images and MIPs were obtained and reviewed  to evaluate the vascular anatomy. RADIATION DOSE REDUCTION: This exam was performed according to the departmental dose-optimization program which includes automated exposure control, adjustment of the mA and/or kV according to patient size and/or use of iterative reconstruction technique. CONTRAST:  151m OMNIPAQUE IOHEXOL 350 MG/ML SOLN COMPARISON:  Radiograph 06/04/2022, CT abdomen pelvis 04/10/2022, 10/05/2021, CT chest 01/12/2021, CT chest 10/30/2017 FINDINGS: CTA CHEST FINDINGS Cardiovascular: Satisfactory opacification of the pulmonary arteries to the segmental level. No evidence of pulmonary embolism. Advanced aortic atherosclerosis. No aneurysm. Extensive coronary vascular calcification. Borderline cardiac size. No pericardial effusion. Mediastinum/Nodes: Midline trachea. No thyroid mass. No suspicious lymph nodes. Esophagus within normal limits. Calcified mediastinal and hilar nodes consistent with prior granulomatous disease. Lungs/Pleura: Mild emphysema. Small bilateral pleural effusions. Bandlike density at the left lung base likely reflects atelectasis. Partial posterior consolidation, atelectasis versus mild pneumonia. 5 mm ground-glass nodule at the lingula without change and felt benign, no follow-up imaging is recommended Musculoskeletal: Sternum is intact. Mild chronic compression deformities at T1, T3, and T8. Review of the MIP images confirms the above findings. 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 Diminutive heavily calcified native distal aorta and iliac vessels. Heavy calcification of the proximal abdominal aorta without aneurysm. Celiac: Appears diminutive. 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 Variant anatomy. SMA: Heavily calcified proximal SMA limits evaluation for stenosis. Splenic and right hepatic arteries appear to take their origin from the SMA. No aneurysm, occlusion or dissection. Renals: Single right and single left renal arteries also heavily calcified. Suspect at least moderate stenosis of the proximal arteries. IMA: Poorly visualized due to artifact from surgical clips. Could potentially be chronically occluded, vascular flow enhancement better visualized on the delayed venous images. Inflow: Status post aortoiliac bypass with patency. Heavily calcified diminutive native vessels. Heavily calcified internal iliac vessels. Veins: No obvious venous abnormality within the limitations of this arterial phase study. Review of the MIP images confirms the above findings. NON-VASCULAR Hepatobiliary: No calcified gallstone. Multiple hypodense subcentimeter liver lesions, too small to further characterize. No biliary dilatation Pancreas: Unremarkable. No pancreatic ductal dilatation or surrounding inflammatory changes. Spleen: Normal in size without focal abnormality. Adrenals/Urinary Tract: Adrenal glands are normal. Atrophic kidneys without hydronephrosis. Multiple renal cystic lesions without highly suspicious lesion, no specific imaging follow-up is recommended. The urinary bladder is decompressed but slightly thick-walled and indistinct. Stomach/Bowel: The stomach is nonenlarged. No dilated small bowel. No acute bowel wall thickening. Lymphatic: No suspicious lymph nodes Reproductive: Prostate is unremarkable. Other: Negative for pelvic effusion or free air Musculoskeletal: Hardware in the right femur with artifact. Treated compression deformity at L3. Multiple chronic compression fractures involving L1, L2, L4 and L5. Review of the MIP images confirms the above findings. IMPRESSION: 1. Negative for acute pulmonary embolus. 2. Small pleural effusions. Mild airspace disease at left lung  base, favor atelectasis and or scar over pneumonia 3. No CT evidence for acute intra-abdominal or pelvic abnormality. Atrophic kidneys without hydronephrosis. 4. 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 Electronically Signed   By: KDonavan FoilM.D.   On: 06/06/2022 19:47   CT Angio Abd/Pel W and/or Wo Contrast  Result Date: 06/06/2022 CLINICAL DATA:  Chest pain short of breath left upper quadrant abdominal pain EXAM: CT ANGIOGRAPHY CHEST, ABDOMEN AND PELVIS TECHNIQUE: Multidetector CT imaging through the chest, abdomen and pelvis was performed  using the standard protocol during bolus administration of intravenous contrast. Multiplanar reconstructed images and MIPs were obtained and reviewed to evaluate the vascular anatomy. RADIATION DOSE REDUCTION: This exam was performed according to the departmental dose-optimization program which includes automated exposure control, adjustment of the mA and/or kV according to patient size and/or use of iterative reconstruction technique. CONTRAST:  17m OMNIPAQUE IOHEXOL 350 MG/ML SOLN COMPARISON:  Radiograph 06/04/2022, CT abdomen pelvis 04/10/2022, 10/05/2021, CT chest 01/12/2021, CT chest 10/30/2017 FINDINGS: CTA CHEST FINDINGS Cardiovascular: Satisfactory opacification of the pulmonary arteries to the segmental level. No evidence of pulmonary embolism. Advanced aortic atherosclerosis. No aneurysm. Extensive coronary vascular calcification. Borderline cardiac size. No pericardial effusion. Mediastinum/Nodes: Midline trachea. No thyroid mass. No suspicious lymph nodes. Esophagus within normal limits. Calcified mediastinal and hilar nodes consistent with prior granulomatous disease. Lungs/Pleura: Mild emphysema. Small bilateral pleural effusions. Bandlike density at the left lung base likely reflects atelectasis. Partial posterior  consolidation, atelectasis versus mild pneumonia. 5 mm ground-glass nodule at the lingula without change and felt benign, no follow-up imaging is recommended Musculoskeletal: Sternum is intact. Mild chronic compression deformities at T1, T3, and T8. Review of the MIP images confirms the above findings. 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 Diminutive heavily calcified native distal aorta and iliac vessels. Heavy calcification of the proximal abdominal aorta without aneurysm. Celiac: Appears diminutive. 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 Variant anatomy. SMA: Heavily calcified proximal SMA limits evaluation for stenosis. Splenic and right hepatic arteries appear to take their origin from the SMA. No aneurysm, occlusion or dissection. Renals: Single right and single left renal arteries also heavily calcified. Suspect at least moderate stenosis of the proximal arteries. IMA: Poorly visualized due to artifact from surgical clips. Could potentially be chronically occluded, vascular flow enhancement better visualized on the delayed venous images. Inflow: Status post aortoiliac bypass with patency. Heavily calcified diminutive native vessels. Heavily calcified internal iliac vessels. Veins: No obvious venous abnormality within the limitations of this arterial phase study. Review of the MIP images confirms the above findings. NON-VASCULAR Hepatobiliary: No calcified gallstone. Multiple hypodense subcentimeter liver lesions, too small to further characterize. No biliary dilatation Pancreas: Unremarkable. No pancreatic ductal dilatation or surrounding inflammatory changes. Spleen: Normal in size without focal abnormality. Adrenals/Urinary Tract: Adrenal glands are normal. Atrophic kidneys without hydronephrosis. Multiple renal cystic lesions without highly suspicious lesion, no specific imaging follow-up is recommended. The urinary bladder is  decompressed but slightly thick-walled and indistinct. Stomach/Bowel: The stomach is nonenlarged. No dilated small bowel. No acute bowel wall thickening. Lymphatic: No suspicious lymph nodes Reproductive: Prostate is unremarkable. Other: Negative for pelvic effusion or free air Musculoskeletal: Hardware in the right femur with artifact. Treated compression deformity at L3. Multiple chronic compression fractures involving L1, L2, L4 and L5. Review of the MIP images confirms the above findings. IMPRESSION: 1. Negative for acute pulmonary embolus. 2. Small pleural effusions. Mild airspace disease at left lung base, favor atelectasis and or scar over pneumonia 3. No CT evidence for acute intra-abdominal or pelvic abnormality. Atrophic kidneys without hydronephrosis. 4. 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 Electronically Signed   By: KDonavan FoilM.D.   On: 06/06/2022 19:47    [facility] 400/A1.5, EDW 67.5kg, 2K/2Ca, UFP #2, AVF, no heparin - Hectoral 972m IV q HD -  Mircera 45mg IV q 2 weeks (last 8/21) - Venofer 59mIV weekly  Assessment/Plan:  Elevated troponin/?NSTEMI: CP free now, ongoing non-specific abdominal pain. Cards following, echo pending, but medical management planned.  ESRD: Continue HD on MWF schedule - next tomorrow, no heparin.  Hypotension/volume: Taking mido pre-HD. No BB at this time d/t hypotension.  Anemia: Hgb 8 - FOBT negative on 9/11. He is overdue for ESA - missed dose when traveling last week - will give with next HD tomorrow (and ^ dose)  Metabolic bone disease: Ca/Phos ok - continue home meds for now.  Nutrition:  Alb low, adding supplements  CAD  T2DM  Dementia  KaVeneta PentonPA-C 06/07/2022, 11:45 AM  [facility]

## 2022-06-07 NOTE — Progress Notes (Signed)
Noted, patient remains a [facility] emergency room, elevated troponin levels, high-sensitivity troponin 526--> 643--> 589. NSTEMI vs demand ischemia. ECHO pending. Seen by cardiology.

## 2022-06-08 DIAGNOSIS — I480 Paroxysmal atrial fibrillation: Secondary | ICD-10-CM

## 2022-06-08 DIAGNOSIS — E7800 Pure hypercholesterolemia, unspecified: Secondary | ICD-10-CM

## 2022-06-08 DIAGNOSIS — I5021 Acute systolic (congestive) heart failure: Secondary | ICD-10-CM | POA: Diagnosis not present

## 2022-06-08 DIAGNOSIS — R079 Chest pain, unspecified: Secondary | ICD-10-CM | POA: Diagnosis not present

## 2022-06-08 DIAGNOSIS — D649 Anemia, unspecified: Secondary | ICD-10-CM | POA: Diagnosis not present

## 2022-06-08 LAB — CBC
HCT: 26.3 % — ABNORMAL LOW (ref 39.0–52.0)
Hemoglobin: 8.4 g/dL — ABNORMAL LOW (ref 13.0–17.0)
MCH: 32.4 pg (ref 26.0–34.0)
MCHC: 31.9 g/dL (ref 30.0–36.0)
MCV: 101.5 fL — ABNORMAL HIGH (ref 80.0–100.0)
Platelets: 199 10*3/uL (ref 150–400)
RBC: 2.59 MIL/uL — ABNORMAL LOW (ref 4.22–5.81)
RDW: 17.3 % — ABNORMAL HIGH (ref 11.5–15.5)
WBC: 6.9 10*3/uL (ref 4.0–10.5)
nRBC: 0 % (ref 0.0–0.2)

## 2022-06-08 LAB — GLUCOSE, CAPILLARY
Glucose-Capillary: 103 mg/dL — ABNORMAL HIGH (ref 70–99)
Glucose-Capillary: 76 mg/dL (ref 70–99)
Glucose-Capillary: 89 mg/dL (ref 70–99)
Glucose-Capillary: 133 mg/dL — ABNORMAL HIGH (ref 70–99)

## 2022-06-08 LAB — RENAL FUNCTION PANEL
Albumin: 2.7 g/dL — ABNORMAL LOW (ref 3.5–5.0)
Anion gap: 20 — ABNORMAL HIGH (ref 5–15)
BUN: 36 mg/dL — ABNORMAL HIGH (ref 8–23)
CO2: 22 mmol/L (ref 22–32)
Calcium: 8.8 mg/dL — ABNORMAL LOW (ref 8.9–10.3)
Chloride: 97 mmol/L — ABNORMAL LOW (ref 98–111)
Creatinine, Ser: 8.93 mg/dL — ABNORMAL HIGH (ref 0.61–1.24)
GFR, Estimated: 5 mL/min — ABNORMAL LOW (ref >60)
Glucose, Bld: 119 mg/dL — ABNORMAL HIGH (ref 70–99)
Phosphorus: 5.6 mg/dL — ABNORMAL HIGH (ref 2.5–4.6)
Potassium: 4 mmol/L (ref 3.5–5.1)
Sodium: 139 mmol/L (ref 135–145)

## 2022-06-08 LAB — T4, FREE: Free T4: 1.08 ng/dL (ref 0.61–1.12)

## 2022-06-08 LAB — TSH: TSH: 1.8 u[IU]/mL (ref 0.350–4.500)

## 2022-06-08 LAB — HEPATITIS B SURFACE ANTIBODY, QUANTITATIVE: Hep B S AB Quant (Post): 194.7 m[IU]/mL (ref >9.9)

## 2022-06-08 MED ORDER — LIDOCAINE-PRILOCAINE 2.5-2.5 % EX CREA: 1.0000 | TOPICAL_CREAM | CUTANEOUS | Status: DC | PRN

## 2022-06-08 MED ORDER — AMIODARONE HCL 200 MG PO TABS: 200.0000 mg | ORAL_TABLET | Freq: Every day | ORAL | Status: DC

## 2022-06-08 MED ORDER — PENTAFLUOROPROP-TETRAFLUOROETH EX AERO: 1.0000 | INHALATION_SPRAY | CUTANEOUS | Status: DC | PRN

## 2022-06-08 MED ORDER — ROSUVASTATIN CALCIUM 5 MG PO TABS
10.0000 mg | ORAL_TABLET | Freq: Every day | ORAL | Status: DC
  Administered 2022-06-08 – 2022-06-10 (×3): 10 mg via ORAL
  Filled 2022-06-08 (×3): qty 2

## 2022-06-08 MED ORDER — AMIODARONE HCL 200 MG PO TABS
200.0000 mg | ORAL_TABLET | Freq: Two times a day (BID) | ORAL | Status: DC
  Administered 2022-06-08 – 2022-06-10 (×4): 200 mg via ORAL
  Filled 2022-06-08 (×4): qty 1

## 2022-06-08 MED ORDER — LIDOCAINE HCL (PF) 1 % IJ SOLN: 5.0000 mL | INTRAMUSCULAR | Status: DC | PRN

## 2022-06-08 NOTE — Progress Notes (Addendum)
Progress Note  Patient Name: [name] Date of Encounter: 06/08/2022  Primary Cardiologist: Minus Breeding, MD  Subjective   Denies any CP, SOB, palpitations this morning. Says his legs are bothering him a little bit today. Per tele review, went into afib RVR around 5:30am. HR 1teens presently. Pt seen in HD.  Inpatient Medications    Scheduled Meds:  allopurinol  100 mg Oral Daily   darbepoetin (ARANESP) injection - DIALYSIS  150 mcg Intravenous Q Fri-HD   donepezil  10 mg Oral QHS   feeding supplement (NEPRO CARB STEADY)  237 mL Oral q AM   insulin aspart  0-5 Units Subcutaneous QHS   insulin aspart  0-6 Units Subcutaneous TID WC   levothyroxine  50 mcg Oral QAC breakfast   memantine  10 mg Oral BID   pantoprazole  40 mg Oral Daily   QUEtiapine  25 mg Oral QHS   sevelamer carbonate  2,400 mg Oral TID WC   And   sevelamer carbonate  1,600 mg Oral With snacks   simvastatin  20 mg Oral QHS   Continuous Infusions:  PRN Meds: acetaminophen **OR** acetaminophen, lidocaine (PF), lidocaine-prilocaine, pentafluoroprop-tetrafluoroeth   Vital Signs    Vitals:   06/08/22 0830 06/08/22 0900 06/08/22 0930 06/08/22 1005  BP: (!) 101/49 (!) 137/95 96/77 (!) 123/104  Pulse: (!) 135 (!) 130    Resp: 12 18 (!) 21 15  Temp:      TempSrc:      SpO2: 100% 94%    Weight:      Height:       No intake or output data in the 24 hours ending 06/08/22 1029    06/08/2022    7:40 AM 06/07/2022    7:04 PM 06/06/2022    2:10 PM  Last 3 Weights  Weight (lbs) 157 lb 13.6 oz 159 lb 9.8 oz 156 lb 9.6 oz  Weight (kg) 71.6 kg 72.4 kg 71.033 kg     Telemetry    AF RVR around 530am - Personally Reviewed  ECG    No new tracings - Personally Reviewed  Physical Exam   GEN: No acute distress.  HEENT: Normocephalic, atraumatic, sclera non-icteric. Neck: No JVD or bruits. Cardiac: Irregularly irregular, rate elevated, no murmurs, rubs, or gallops.  Respiratory: Clear to auscultation  bilaterally. Breathing is unlabored. GI: Soft, nontender, non-distended, BS +x 4. MS: no deformity. Extremities: No clubbing or cyanosis. No edema. Distal pedal pulses are in tact and equal bilaterally. Neuro:  A+O to self, dialysis but not hospital, not date. Follows commands. Psych:  Responds to questions appropriately with a normal affect.  Labs    High Sensitivity Troponin:   Recent Labs  Lab 06/06/22 1703 06/06/22 1814 06/06/22 2315  TROPONINIHS 526* 643* 589*      Cardiac EnzymesNo results for input(s): "TROPONINI" in the last 168 hours. No results for input(s): "TROPIPOC" in the last 168 hours.   Chemistry Recent Labs  Lab 06/04/22 1634 06/05/22 1116 06/06/22 1819 06/07/22 0259 06/08/22 0800  NA 139  --  143 141 139  K 3.3*  --  3.7 3.5 4.0  CL 94*  --  100 96* 97*  CO2 30  --  33* 30 22  GLUCOSE 134*   < > 113* 95 119*  BUN 23  --  25* 27* 36*  CREATININE 5.35*  --  6.46* 7.03* 8.93*  CALCIUM 8.4*   < > 8.5* 8.6* 8.8*  PROT 6.3*  --  5.8*  --   --  ALBUMIN 3.0*  --  2.8* 2.7* 2.7*  AST 26  --  21  --   --   ALT 11  --  11  --   --   ALKPHOS 126  --  114  --   --   BILITOT 0.3  --  0.5  --   --   GFRNONAA 10*  --  8* 7* 5*  ANIONGAP 15  --  10 15 20*   < > = values in this interval not displayed.     Hematology Recent Labs  Lab 06/06/22 1703 06/07/22 0259 06/08/22 0800  WBC 6.3 5.6 6.9  RBC 2.80* 2.40* 2.59*  HGB 9.2* 8.0* 8.4*  HCT 29.4* 24.2* 26.3*  MCV 105.0* 100.8* 101.5*  MCH 32.9 33.3 32.4  MCHC 31.3 33.1 31.9  RDW 17.8* 17.4* 17.3*  PLT 274 179 199    BNP Recent Labs  Lab 06/06/22 2010  BNP 3,250.3*     DDimer No results for input(s): "DDIMER" in the last 168 hours.   Radiology    ECHOCARDIOGRAM COMPLETE  Result Date: 06/07/2022    ECHOCARDIOGRAM REPORT   Patient Name:   [name] Cremeans Date of Exam: 06/07/2022 Medical Rec #:  [id number]      Height:       67.0 in Accession #:    [id number]     Weight:       156.6 lb Date of  Birth:  09-16-1941      BSA:          1.823 m Patient Age:    81 years       BP:           134/90 mmHg Patient Gender: M              HR:           52 bpm. Exam Location:  Inpatient Procedure: 2D Echo, Color Doppler and Cardiac Doppler Indications:    Chest pain/Elevated troponin  History:        Patient has prior history of Echocardiogram examinations, most                 recent 11/07/2020. CAD, Hx of AAA, Signs/Symptoms:Alzheimer's;                 Risk Factors:Diabetes, Dyslipidemia, Hypertension and Sleep                 Apnea.  Sonographer:    Eartha Inch Referring Phys: [id number] [place]  Sonographer Comments: Technically difficult study due to poor echo windows. Image acquisition challenging due to patient body habitus. Pt scanned supine. Could not be positioned in left lateral decubitus due to back pain. IMPRESSIONS  1. LV apical false tendon (normal variant). Left ventricular ejection fraction, by estimation, is 45 to 50%. The left ventricle has mildly decreased function. The left ventricle demonstrates global hypokinesis. There is mild left ventricular hypertrophy. Left ventricular diastolic parameters are consistent with Grade I diastolic dysfunction (impaired relaxation). Elevated left ventricular end-diastolic pressure. The E/e' is 35.  2. Right ventricular systolic function is normal. The right ventricular size is normal. There is mildly elevated pulmonary artery systolic pressure. The estimated right ventricular systolic pressure is 12.8 mmHg.  3. The mitral valve is abnormal. Mild to moderate mitral valve regurgitation.  4. The tricuspid valve is abnormal. Tricuspid valve regurgitation is mild to moderate.  5. The aortic valve is tricuspid. Aortic valve regurgitation is not visualized.  6.  The inferior vena cava is dilated in size with <50% respiratory variability, suggesting right atrial pressure of 15 mmHg. Comparison(s): Changes from prior study are noted. 11/07/2020: LVEF 55-60%, RVSP  40 mmHg, trivial MR. FINDINGS  Left Ventricle: LV apical false tendon (normal variant). Left ventricular ejection fraction, by estimation, is 45 to 50%. The left ventricle has mildly decreased function. The left ventricle demonstrates global hypokinesis. The left ventricular internal  cavity size was normal in size. There is mild left ventricular hypertrophy. Left ventricular diastolic parameters are consistent with Grade I diastolic dysfunction (impaired relaxation). Elevated left ventricular end-diastolic pressure. The E/e' is 63. Right Ventricle: The right ventricular size is normal. No increase in right ventricular wall thickness. Right ventricular systolic function is normal. There is mildly elevated pulmonary artery systolic pressure. The tricuspid regurgitant velocity is 2.52  m/s, and with an assumed right atrial pressure of 15 mmHg, the estimated right ventricular systolic pressure is 02.5 mmHg. Left Atrium: Left atrial size was normal in size. Right Atrium: Right atrial size was normal in size. Pericardium: There is no evidence of pericardial effusion. Mitral Valve: The mitral valve is abnormal. There is mild thickening of the anterior and posterior mitral valve leaflet(s). Mild mitral annular calcification. Mild to moderate mitral valve regurgitation. Tricuspid Valve: The tricuspid valve is abnormal. Tricuspid valve regurgitation is mild to moderate. Aortic Valve: The aortic valve is tricuspid. Aortic valve regurgitation is not visualized. Pulmonic Valve: The pulmonic valve was normal in structure. Pulmonic valve regurgitation is not visualized. Aorta: The aortic root and ascending aorta are structurally normal, with no evidence of dilitation. Venous: The inferior vena cava is dilated in size with less than 50% respiratory variability, suggesting right atrial pressure of 15 mmHg. IAS/Shunts: No atrial level shunt detected by color flow Doppler.  LEFT VENTRICLE PLAX 2D LVIDd:         3.60 cm     Diastology  LVIDs:         3.20 cm     LV e' medial:    5.33 cm/s LV PW:         1.20 cm     LV E/e' medial:  21.2 LV IVS:        1.20 cm     LV e' lateral:   6.53 cm/s LVOT diam:     2.10 cm     LV E/e' lateral: 17.3 LV SV:         73 LV SV Index:   40 LVOT Area:     3.46 cm  LV Volumes (MOD) LV vol d, MOD A2C: 89.0 ml LV vol s, MOD A2C: 52.5 ml LV SV MOD A2C:     36.5 ml RIGHT VENTRICLE             IVC RV S prime:     14.50 cm/s  IVC diam: 2.10 cm TAPSE (M-mode): 2.6 cm LEFT ATRIUM             Index        RIGHT ATRIUM           Index LA diam:        3.60 cm 1.98 cm/m   RA Area:     17.80 cm LA Vol (A2C):   45.1 ml 24.75 ml/m  RA Volume:   45.10 ml  24.75 ml/m LA Vol (A4C):   49.8 ml 27.32 ml/m LA Biplane Vol: 49.7 ml 27.27 ml/m  AORTIC VALVE LVOT Vmax:   95.00 cm/s LVOT  Vmean:  62.500 cm/s LVOT VTI:    0.211 m  AORTA Ao Root diam: 3.40 cm Ao Asc diam:  3.30 cm MITRAL VALVE                  TRICUSPID VALVE MV Area (PHT): 3.74 cm       TR Peak grad:   25.4 mmHg MV Decel Time: 203 msec       TR Mean grad:   16.0 mmHg MR Peak grad:    141.1 mmHg   TR Vmax:        252.00 cm/s MR Mean grad:    102.0 mmHg   TR Vmean:       193.0 cm/s MR Vmax:         594.00 cm/s MR Vmean:        475.0 cm/s   SHUNTS MR PISA:         1.57 cm     Systemic VTI:  0.21 m MR PISA Eff ROA: 10 mm       Systemic Diam: 2.10 cm MR PISA Radius:  0.50 cm MV E velocity: 113.00 cm/s MV A velocity: 126.00 cm/s MV E/A ratio:  0.90 Lyman Bishop MD Electronically signed by Lyman Bishop MD Signature Date/Time: 06/07/2022/4:00:16 PM    Final    CT Angio Chest PE W and/or Wo Contrast  Result Date: 06/06/2022 CLINICAL DATA:  Chest pain short of breath left upper quadrant abdominal pain EXAM: CT ANGIOGRAPHY CHEST, ABDOMEN AND PELVIS TECHNIQUE: Multidetector CT imaging through the chest, abdomen and pelvis was performed using the standard protocol during bolus administration of intravenous contrast. Multiplanar reconstructed images and MIPs were obtained and  reviewed to evaluate the vascular anatomy. RADIATION DOSE REDUCTION: This exam was performed according to the departmental dose-optimization program which includes automated exposure control, adjustment of the mA and/or kV according to patient size and/or use of iterative reconstruction technique. CONTRAST:  153m OMNIPAQUE IOHEXOL 350 MG/ML SOLN COMPARISON:  Radiograph 06/04/2022, CT abdomen pelvis 04/10/2022, 10/05/2021, CT chest 01/12/2021, CT chest 10/30/2017 FINDINGS: CTA CHEST FINDINGS Cardiovascular: Satisfactory opacification of the pulmonary arteries to the segmental level. No evidence of pulmonary embolism. Advanced aortic atherosclerosis. No aneurysm. Extensive coronary vascular calcification. Borderline cardiac size. No pericardial effusion. Mediastinum/Nodes: Midline trachea. No thyroid mass. No suspicious lymph nodes. Esophagus within normal limits. Calcified mediastinal and hilar nodes consistent with prior granulomatous disease. Lungs/Pleura: Mild emphysema. Small bilateral pleural effusions. Bandlike density at the left lung base likely reflects atelectasis. Partial posterior consolidation, atelectasis versus mild pneumonia. 5 mm ground-glass nodule at the lingula without change and felt benign, no follow-up imaging is recommended Musculoskeletal: Sternum is intact. Mild chronic compression deformities at T1, T3, and T8. Review of the MIP images confirms the above findings. 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 Diminutive heavily calcified native distal aorta and iliac vessels. Heavy calcification of the proximal abdominal aorta without aneurysm. Celiac: Appears diminutive. 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 Variant anatomy. SMA: Heavily calcified proximal SMA limits evaluation for stenosis. Splenic and right hepatic arteries appear to take their origin from the SMA. No aneurysm, occlusion or dissection. Renals: Single  right and single left renal arteries also heavily calcified. Suspect at least moderate stenosis of the proximal arteries. IMA: Poorly visualized due to artifact from surgical clips. Could potentially be chronically occluded, vascular flow enhancement better visualized on the delayed venous images. Inflow: Status post aortoiliac bypass with patency. Heavily  calcified diminutive native vessels. Heavily calcified internal iliac vessels. Veins: No obvious venous abnormality within the limitations of this arterial phase study. Review of the MIP images confirms the above findings. NON-VASCULAR Hepatobiliary: No calcified gallstone. Multiple hypodense subcentimeter liver lesions, too small to further characterize. No biliary dilatation Pancreas: Unremarkable. No pancreatic ductal dilatation or surrounding inflammatory changes. Spleen: Normal in size without focal abnormality. Adrenals/Urinary Tract: Adrenal glands are normal. Atrophic kidneys without hydronephrosis. Multiple renal cystic lesions without highly suspicious lesion, no specific imaging follow-up is recommended. The urinary bladder is decompressed but slightly thick-walled and indistinct. Stomach/Bowel: The stomach is nonenlarged. No dilated small bowel. No acute bowel wall thickening. Lymphatic: No suspicious lymph nodes Reproductive: Prostate is unremarkable. Other: Negative for pelvic effusion or free air Musculoskeletal: Hardware in the right femur with artifact. Treated compression deformity at L3. Multiple chronic compression fractures involving L1, L2, L4 and L5. Review of the MIP images confirms the above findings. IMPRESSION: 1. Negative for acute pulmonary embolus. 2. Small pleural effusions. Mild airspace disease at left lung base, favor atelectasis and or scar over pneumonia 3. No CT evidence for acute intra-abdominal or pelvic abnormality. Atrophic kidneys without hydronephrosis. 4. Extensive atherosclerotic vascular disease of the abdomen and  pelvis. 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 Electronically Signed   By: Donavan Foil M.D.   On: 06/06/2022 19:47   CT Angio Abd/Pel W and/or Wo Contrast  Result Date: 06/06/2022 CLINICAL DATA:  Chest pain short of breath left upper quadrant abdominal pain EXAM: CT ANGIOGRAPHY CHEST, ABDOMEN AND PELVIS TECHNIQUE: Multidetector CT imaging through the chest, abdomen and pelvis was performed using the standard protocol during bolus administration of intravenous contrast. Multiplanar reconstructed images and MIPs were obtained and reviewed to evaluate the vascular anatomy. RADIATION DOSE REDUCTION: This exam was performed according to the departmental dose-optimization program which includes automated exposure control, adjustment of the mA and/or kV according to patient size and/or use of iterative reconstruction technique. CONTRAST:  14m OMNIPAQUE IOHEXOL 350 MG/ML SOLN COMPARISON:  Radiograph 06/04/2022, CT abdomen pelvis 04/10/2022, 10/05/2021, CT chest 01/12/2021, CT chest 10/30/2017 FINDINGS: CTA CHEST FINDINGS Cardiovascular: Satisfactory opacification of the pulmonary arteries to the segmental level. No evidence of pulmonary embolism. Advanced aortic atherosclerosis. No aneurysm. Extensive coronary vascular calcification. Borderline cardiac size. No pericardial effusion. Mediastinum/Nodes: Midline trachea. No thyroid mass. No suspicious lymph nodes. Esophagus within normal limits. Calcified mediastinal and hilar nodes consistent with prior granulomatous disease. Lungs/Pleura: Mild emphysema. Small bilateral pleural effusions. Bandlike density at the left lung base likely reflects atelectasis. Partial posterior consolidation, atelectasis versus mild pneumonia. 5 mm ground-glass nodule at the lingula without change and felt benign, no follow-up imaging is recommended Musculoskeletal: Sternum is intact. Mild chronic compression  deformities at T1, T3, and T8. Review of the MIP images confirms the above findings. 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 Diminutive heavily calcified native distal aorta and iliac vessels. Heavy calcification of the proximal abdominal aorta without aneurysm. Celiac: Appears diminutive. 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 Variant anatomy. SMA: Heavily calcified proximal SMA limits evaluation for stenosis. Splenic and right hepatic arteries appear to take their origin from the SMA. No aneurysm, occlusion or dissection. Renals: Single right and single left renal arteries also heavily calcified. Suspect at least moderate stenosis of the proximal arteries. IMA: Poorly visualized due to artifact from surgical clips. Could potentially be chronically  occluded, vascular flow enhancement better visualized on the delayed venous images. Inflow: Status post aortoiliac bypass with patency. Heavily calcified diminutive native vessels. Heavily calcified internal iliac vessels. Veins: No obvious venous abnormality within the limitations of this arterial phase study. Review of the MIP images confirms the above findings. NON-VASCULAR Hepatobiliary: No calcified gallstone. Multiple hypodense subcentimeter liver lesions, too small to further characterize. No biliary dilatation Pancreas: Unremarkable. No pancreatic ductal dilatation or surrounding inflammatory changes. Spleen: Normal in size without focal abnormality. Adrenals/Urinary Tract: Adrenal glands are normal. Atrophic kidneys without hydronephrosis. Multiple renal cystic lesions without highly suspicious lesion, no specific imaging follow-up is recommended. The urinary bladder is decompressed but slightly thick-walled and indistinct. Stomach/Bowel: The stomach is nonenlarged. No dilated small bowel. No acute bowel wall thickening. Lymphatic: No suspicious lymph nodes Reproductive: Prostate is  unremarkable. Other: Negative for pelvic effusion or free air Musculoskeletal: Hardware in the right femur with artifact. Treated compression deformity at L3. Multiple chronic compression fractures involving L1, L2, L4 and L5. Review of the MIP images confirms the above findings. IMPRESSION: 1. Negative for acute pulmonary embolus. 2. Small pleural effusions. Mild airspace disease at left lung base, favor atelectasis and or scar over pneumonia 3. No CT evidence for acute intra-abdominal or pelvic abnormality. Atrophic kidneys without hydronephrosis. 4. 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 Electronically Signed   By: Donavan Foil M.D.   On: 06/06/2022 19:47    Cardiac Studies   2d echo 06/07/22    1. LV apical false tendon (normal variant). Left ventricular ejection  fraction, by estimation, is 45 to 50%. The left ventricle has mildly  decreased function. The left ventricle demonstrates global hypokinesis.  There is mild left ventricular  hypertrophy. Left ventricular diastolic parameters are consistent with  Grade I diastolic dysfunction (impaired relaxation). Elevated left  ventricular end-diastolic pressure. The E/e' is 47.   2. Right ventricular systolic function is normal. The right ventricular  size is normal. There is mildly elevated pulmonary artery systolic  pressure. The estimated right ventricular systolic pressure is 03.5 mmHg.   3. The mitral valve is abnormal. Mild to moderate mitral valve  regurgitation.   4. The tricuspid valve is abnormal. Tricuspid valve regurgitation is mild  to moderate.   5. The aortic valve is tricuspid. Aortic valve regurgitation is not  visualized.   6. The inferior vena cava is dilated in size with <50% respiratory  variability, suggesting right atrial pressure of 15 mmHg.   Comparison(s): Changes from prior  study are noted. 11/07/2020: LVEF 55-60%,  RVSP 40 mmHg, trivial MR.   Patient Profile     81 y.o. 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  At the time he was found to be more anemic but further work-up was unremarkable so he was discharged with plan to follow-up with his PCP.  He was seen at GI on 9/13 for the abdominal pain and during the visit he was noted to be tachycardic and reportedly complained of chest pain.  He was transferred to the emergency department for further evaluation. In the ER he was  in NSR with HR 70s-80s. Cardiology consulted for elevated troponin peak 643.     Assessment & Plan    1. Abdominal pain - CT abdomen pelvis without explanation for the patient's abdominal pain, patent prior aortobifem graft - found to be volume overloaded, missed 1 session of HD last week due to being on vacation -> nephrology following, getting HD presently  2. Tachycardia (PAF, PAT) - reported to be tachy in GI office yesterday, has known hx of PAT/PAF in the past - went back into atrial fib this AM around 5:30, no acute symptoms, BP preserved - will repeat EKG so we have that point of reference in his chart - given chronic hypotension and ESRD, options are somewhat limited but will review with MD - may be reasonable to consider amiodarone (arrhythmia is paroxysmal at this point so would not specifically be pursuing DCCV). If amiodarone chosen, would recommend to change simvastatin to alternative statin given drug interaction. Addendum: QTc looks prolonged on EKG though challenging given AF RVR, so may need to consider EP evaluation for further input, will await MD  round - not a candidate for anticoagulation due to GIB and chronic subdural hematoma - recommend TSH, free T4 given abnormal value in 09/2021  3. 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 ESRD on HD, anemia - per primary team/nephrology  For questions or updates, please contact [place] Please consult [email or web address] for contact info under Cardiology/STEMI.  Signed, Charlie Pitter, PA-C 06/08/2022, 10:29 AM

## 2022-06-08 NOTE — Progress Notes (Signed)
Pt receives out-pt HD at [facility] on MWF. Will assist as needed.   Gabino Hagin Renal Navigator [phone] 

## 2022-06-08 NOTE — Procedures (Signed)
I was present at this dialysis session. I have reviewed the session itself and made appropriate changes.   Yesterday K of 3.5 on [employer], AML pending.  UF goal of 3L.  Hb stable 8.4 for ESA today.   Cardiology notes reveiwed.  He still c/o of mild LLQ abd pain w/o R/G.  Imaging 9/13 was negative for diverticular disease, other findings in LLQ.    Filed Weights   06/07/22 1904 06/08/22 0740  Weight: 72.4 kg 71.6 kg    Recent Labs  Lab 06/07/22 0259  NA 141  K 3.5  CL 96*  CO2 30  GLUCOSE 95  BUN 27*  CREATININE 7.03*  CALCIUM 8.6*  PHOS 5.0*    Recent Labs  Lab 06/04/22 1634 06/05/22 1344 06/06/22 1703 06/07/22 0259 06/08/22 0800  WBC 6.7   < > 6.3 5.6 6.9  NEUTROABS 4.5  --  4.0  --   --   HGB 8.6*   < > 9.2* 8.0* 8.4*  HCT 25.9*   < > 29.4* 24.2* 26.3*  MCV 98.9   < > 105.0* 100.8* 101.5*  PLT 186   < > 274 179 199   < > = values in this interval not displayed.    Scheduled Meds:  allopurinol  100 mg Oral Daily   darbepoetin (ARANESP) injection - DIALYSIS  150 mcg Intravenous Q Fri-HD   donepezil  10 mg Oral QHS   feeding supplement (NEPRO CARB STEADY)  237 mL Oral q AM   insulin aspart  0-5 Units Subcutaneous QHS   insulin aspart  0-6 Units Subcutaneous TID WC   levothyroxine  50 mcg Oral QAC breakfast   memantine  10 mg Oral BID   pantoprazole  40 mg Oral Daily   QUEtiapine  25 mg Oral QHS   sevelamer carbonate  2,400 mg Oral TID WC   And   sevelamer carbonate  1,600 mg Oral With snacks   simvastatin  20 mg Oral QHS   Continuous Infusions: PRN Meds:.acetaminophen **OR** acetaminophen, lidocaine (PF), lidocaine-prilocaine, pentafluoroprop-tetrafluoroeth   Pearson Grippe  MD 06/08/2022, 8:22 AM

## 2022-06-08 NOTE — Progress Notes (Signed)
PROGRESS NOTE  MANTAJ CHAMBERLIN WCB:[id number] DOB: [birth year] DOA: 06/06/2022 PCP: Elby Showers, MD  HPI/Recap of past 24 hours: LARWENCE TU is a 81 y.o. 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  Seen in the ED 2 days prior to presentation for left-sided abdominal pain.  Work-up done at that time revealed 2 g drop in hemoglobin (hemoglobin 10.5 > 8.6) but FOBT negative.  X-ray showing potential small ileus.  He was discharged with plan to follow-up with PCP.  Patient was seen at GI office 06/06/22 for left lower quadrant abdominal pain.  He was tachycardic during this visit and complained of chest pain, sent to the ED for further evaluation.  In the ED, patient was in normal sinus rhythm with rate in the 70s to 80s.    Work-up in the ED revealed elevated troponin and elevated BNP for which cardiology was consulted.  Was not started on heparin drip due to history of GI bleed.  Cardiology and nephrology are following.  Latest HD completed on 06/08/2022.   06/08/2022: The patient was seen and examined at his bedside at the hemodialysis center.  He has no new complaints.  Denies having any chest pain or abdominal pain today.     Assessment/Plan: Principal Problem:   Chest pain Active Problems:   Elevated troponin   CAD in native artery   Hypothyroidism   ESRD (end stage renal disease) ([place])   Diabetes mellitus type 2 in nonobese (HCC)   Hyperlipidemia, unspecified   Acute on chronic anemia   Alzheimer's disease, unspecified (CODE) (HCC)   Abdominal pain   Volume overload   Acute systolic heart failure (HCC)  Resolved chest pain, elevated troponin History of CAD High-sensitivity troponin 526> 643> 589.  EKG showing T wave  flattening in inferolateral leads which was new compared to his prior EKG but unchanged on repeat EKG.  CTA chest negative for PE.   Seen by cardiology, recommended not to start heparin drip due to patient having history of AVMs and subdural hematoma.   Management per cardiology.   Resolved left lower quadrant abdominal pain CT abdomen pelvis without explanation for the patient's abdominal pain.  Abdominal exam benign. -Symptomatic management, continue to monitor   ESRD on HD MWF Volume overload BNP elevated at 3250 and CT showing small pleural effusions. Echo done in February 2022 showing EF 55 to 61%, grade 1 diastolic dysfunction.   Nephrology, following, appreciate assistance. Volume status addressed with hemodialysis. 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 Hemoglobin uptrending 8.4 from 8.0.   Non-insulin-dependent type 2 diabetes A1c 6.5 on 05/17/2022. -Hold oral hypoglycemic agents -Very sensitive sliding scale insulin   Hypertension Stable, not on antihypertensives.  Receives midodrine on dialysis days for hypotension. -Monitor blood pressure closely   Hyperlipidemia -Continue statin   Hypothyroidism -Continue Synthroid   Gout -Continue allopurinol   GERD -Continue PPI  Generalized weakness PT OT with assistance and fall precautions.     DVT prophylaxis: SCDs Code Status: DNR.  Per review of MOST form in the chart, patient is  DNR.  Also discussed CODE STATUS with the patient's wife and she confirms that he is DNR. Family Communication: Wife updated on the phone. Consults called: Cardiology, nephrology. Level of care: Progressive Care Unit   Status is: Inpatient     Objective: Vitals:   06/08/22  1130 06/08/22 1200 06/08/22 1211 06/08/22 1248  BP: (!) 132/96 128/67 102/85 (!) 90/48  Pulse: 72 75  (!) 120  Resp: '14 16 18 15  '$ Temp:   98.7 F (37.1 C)   TempSrc:   Oral   SpO2: 100% 96% 100% 95%  Weight:   70.2 kg   Height:        Intake/Output Summary (Last 24 hours) at 06/08/2022 1348 Last data filed at 06/08/2022 1211 Gross per 24 hour  Intake --  Output 2400 ml  Net -2400 ml   Filed Weights   06/07/22 1904 06/08/22 0740 06/08/22 1211  Weight: 72.4 kg 71.6 kg 70.2 kg    Exam:  General: 81 y.o. year-old male frail-appearing no acute distress.  He is alert and mildly confused.   Cardiovascular: Regular rate and rhythm no rubs or gallops.   Respiratory: Clear to auscultation with no wheezes or rales. Abdomen: Soft noted normal bowel sounds present.   Musculoskeletal: Trace lower extremity edema bilaterally.   Skin: No ulcerative lesions noted. Psychiatry: Mood is appropriate for condition.   Data Reviewed: CBC: Recent Labs  Lab 06/04/22 1634 06/05/22 1344 06/06/22 1703 06/07/22 0259 06/08/22 0800  WBC 6.7 6.8 6.3 5.6 6.9  NEUTROABS 4.5  --  4.0  --   --   HGB 8.6* 9.2* 9.2* 8.0* 8.4*  HCT 25.9* 27.9* 29.4* 24.2* 26.3*  MCV 98.9 99.5 105.0* 100.8* 101.5*  PLT 186 207.0 274 179 947   Basic Metabolic Panel: Recent Labs  Lab 06/04/22 1634 06/05/22 1116 06/06/22 1819 06/07/22 0259 06/08/22 0800  NA 139  --  143 141 139  K 3.3*  --  3.7 3.5 4.0  CL 94*  --  100 96* 97*  CO2 30  --  33* 30 22  GLUCOSE 134* 101* 113* 95 119*  BUN 23  --  25* 27* 36*  CREATININE 5.35*  --  6.46* 7.03* 8.93*  CALCIUM 8.4* 9.2 8.5* 8.6* 8.8*  PHOS  --   --   --  5.0* 5.6*   GFR: Estimated Creatinine Clearance: 6.6 mL/min (A) (by C-G formula based on SCr of 8.93 mg/dL (H)). Liver Function Tests: Recent Labs  Lab 06/04/22 1634 06/06/22 1819 06/07/22 0259 06/08/22 0800  AST 26 21  --   --   ALT 11 11  --   --   ALKPHOS 126 114  --   --   BILITOT 0.3 0.5  --   --    PROT 6.3* 5.8*  --   --   ALBUMIN 3.0* 2.8* 2.7* 2.7*   Recent Labs  Lab 06/04/22 1634 06/06/22 1819  LIPASE 46 46   No results for input(s): "AMMONIA" in the last 168 hours. Coagulation Profile: No results for input(s): "INR", "PROTIME" in the last 168 hours. Cardiac Enzymes: No results for input(s): "CKTOTAL", "CKMB", "CKMBINDEX", "TROPONINI" in the last 168 hours. BNP (last 3 results) No results for input(s): "PROBNP" in the last 8760 hours. HbA1C: No results for input(s): "HGBA1C" in the last 72 hours. CBG: Recent Labs  Lab 06/07/22 1136 06/07/22 1802 06/07/22 2113 06/08/22 0614 06/08/22 1251  GLUCAP 111* 161* 119* 103* 76   Lipid Profile: No results for  input(s): "CHOL", "HDL", "LDLCALC", "TRIG", "CHOLHDL", "LDLDIRECT" in the last 72 hours. Thyroid Function Tests: No results for input(s): "TSH", "T4TOTAL", "FREET4", "T3FREE", "THYROIDAB" in the last 72 hours. Anemia Panel: No results for input(s): "VITAMINB12", "FOLATE", "FERRITIN", "TIBC", "IRON", "RETICCTPCT" in the last 72 hours. Urine analysis:    Component Value Date/Time   COLORURINE AMBER (A) 04/09/2022 2051   APPEARANCEUR CLOUDY (A) 04/09/2022 2051   LABSPEC 1.013 04/09/2022 2051   PHURINE 8.0 04/09/2022 2051   GLUCOSEU NEGATIVE 04/09/2022 2051   HGBUR SMALL (A) 04/09/2022 2051   BILIRUBINUR NEGATIVE 04/09/2022 2051   BILIRUBINUR neg 02/14/2015 1115   KETONESUR NEGATIVE 04/09/2022 2051   PROTEINUR >=300 (A) 04/09/2022 2051   UROBILINOGEN 0.2 02/20/2015 0944   NITRITE NEGATIVE 04/09/2022 2051   LEUKOCYTESUR LARGE (A) 04/09/2022 2051   Sepsis Labs: '@LABRCNTIP'$ (procalcitonin:4,lacticidven:4)  )No results found for this or any previous visit (from the past 240 hour(s)).    Studies: ECHOCARDIOGRAM COMPLETE  Result Date: 06/07/2022    ECHOCARDIOGRAM REPORT   Patient Name:   [name] COLEMAN Stong Date of Exam: 06/07/2022 Medical Rec #:  [id number]      Height:       67.0 in Accession #:    [id number]      Weight:       156.6 lb Date of Birth:  11/12/1940      BSA:          1.823 m Patient Age:    59 years       BP:           134/90 mmHg Patient Gender: M              HR:           52 bpm. Exam Location:  Inpatient Procedure: 2D Echo, Color Doppler and Cardiac Doppler Indications:    Chest pain/Elevated troponin  History:        Patient has prior history of Echocardiogram examinations, most                 recent 11/07/2020. CAD, Hx of AAA, Signs/Symptoms:Alzheimer's;                 Risk Factors:Diabetes, Dyslipidemia, Hypertension and Sleep                 Apnea.  Sonographer:    Eartha Inch Referring Phys: [id number] [place]  Sonographer Comments: Technically difficult study due to poor echo windows. Image acquisition challenging due to patient body habitus. Pt scanned supine. Could not be positioned in left lateral decubitus due to back pain. IMPRESSIONS  1. LV apical false tendon (normal variant). Left ventricular ejection fraction, by estimation, is 45 to 50%. The left ventricle has mildly decreased function. The left ventricle demonstrates global hypokinesis. There is mild left ventricular hypertrophy. Left ventricular diastolic parameters are consistent with Grade I diastolic dysfunction (impaired relaxation). Elevated left ventricular end-diastolic pressure. The E/e' is 104.  2. Right ventricular systolic function is normal. The right ventricular size is normal. There is mildly elevated pulmonary artery systolic pressure. The estimated right ventricular systolic pressure is 63.8 mmHg.  3. The mitral valve is abnormal. Mild to moderate mitral valve regurgitation.  4. The tricuspid valve is abnormal. Tricuspid valve regurgitation is mild to moderate.  5. The aortic valve is tricuspid. Aortic valve regurgitation is not visualized.  6. The inferior vena cava is dilated in size with <50% respiratory variability, suggesting right atrial pressure of 15 mmHg. Comparison(s): Changes from  prior study are  noted. 11/07/2020: LVEF 55-60%, RVSP 40 mmHg, trivial MR. FINDINGS  Left Ventricle: LV apical false tendon (normal variant). Left ventricular ejection fraction, by estimation, is 45 to 50%. The left ventricle has mildly decreased function. The left ventricle demonstrates global hypokinesis. The left ventricular internal  cavity size was normal in size. There is mild left ventricular hypertrophy. Left ventricular diastolic parameters are consistent with Grade I diastolic dysfunction (impaired relaxation). Elevated left ventricular end-diastolic pressure. The E/e' is 28. Right Ventricle: The right ventricular size is normal. No increase in right ventricular wall thickness. Right ventricular systolic function is normal. There is mildly elevated pulmonary artery systolic pressure. The tricuspid regurgitant velocity is 2.52  m/s, and with an assumed right atrial pressure of 15 mmHg, the estimated right ventricular systolic pressure is 95.0 mmHg. Left Atrium: Left atrial size was normal in size. Right Atrium: Right atrial size was normal in size. Pericardium: There is no evidence of pericardial effusion. Mitral Valve: The mitral valve is abnormal. There is mild thickening of the anterior and posterior mitral valve leaflet(s). Mild mitral annular calcification. Mild to moderate mitral valve regurgitation. Tricuspid Valve: The tricuspid valve is abnormal. Tricuspid valve regurgitation is mild to moderate. Aortic Valve: The aortic valve is tricuspid. Aortic valve regurgitation is not visualized. Pulmonic Valve: The pulmonic valve was normal in structure. Pulmonic valve regurgitation is not visualized. Aorta: The aortic root and ascending aorta are structurally normal, with no evidence of dilitation. Venous: The inferior vena cava is dilated in size with less than 50% respiratory variability, suggesting right atrial pressure of 15 mmHg. IAS/Shunts: No atrial level shunt detected by color flow Doppler.  LEFT VENTRICLE PLAX 2D  LVIDd:         3.60 cm     Diastology LVIDs:         3.20 cm     LV e' medial:    5.33 cm/s LV PW:         1.20 cm     LV E/e' medial:  21.2 LV IVS:        1.20 cm     LV e' lateral:   6.53 cm/s LVOT diam:     2.10 cm     LV E/e' lateral: 17.3 LV SV:         73 LV SV Index:   40 LVOT Area:     3.46 cm  LV Volumes (MOD) LV vol d, MOD A2C: 89.0 ml LV vol s, MOD A2C: 52.5 ml LV SV MOD A2C:     36.5 ml RIGHT VENTRICLE             IVC RV S prime:     14.50 cm/s  IVC diam: 2.10 cm TAPSE (M-mode): 2.6 cm LEFT ATRIUM             Index        RIGHT ATRIUM           Index LA diam:        3.60 cm 1.98 cm/m   RA Area:     17.80 cm LA Vol (A2C):   45.1 ml 24.75 ml/m  RA Volume:   45.10 ml  24.75 ml/m LA Vol (A4C):   49.8 ml 27.32 ml/m LA Biplane Vol: 49.7 ml 27.27 ml/m  AORTIC VALVE LVOT Vmax:   95.00 cm/s LVOT Vmean:  62.500 cm/s LVOT VTI:    0.211 m  AORTA Ao Root diam: 3.40 cm Ao Asc diam:  3.30 cm MITRAL VALVE                  TRICUSPID VALVE MV Area (PHT): 3.74 cm       TR Peak grad:   25.4 mmHg MV Decel Time: 203 msec       TR Mean grad:   16.0 mmHg MR Peak grad:    141.1 mmHg   TR Vmax:        252.00 cm/s MR Mean grad:    102.0 mmHg   TR Vmean:       193.0 cm/s MR Vmax:         594.00 cm/s MR Vmean:        475.0 cm/s   SHUNTS MR PISA:         1.57 cm     Systemic VTI:  0.21 m MR PISA Eff ROA: 10 mm       Systemic Diam: 2.10 cm MR PISA Radius:  0.50 cm MV E velocity: 113.00 cm/s MV A velocity: 126.00 cm/s MV E/A ratio:  0.90 Lyman Bishop MD Electronically signed by Lyman Bishop MD Signature Date/Time: 06/07/2022/4:00:16 PM    Final     Scheduled Meds:  allopurinol  100 mg Oral Daily   amiodarone  200 mg Oral BID   [START ON 06/22/2022] amiodarone  200 mg Oral Daily   darbepoetin (ARANESP) injection - DIALYSIS  150 mcg Intravenous Q Fri-HD   donepezil  10 mg Oral QHS   feeding supplement (NEPRO CARB STEADY)  237 mL Oral q AM   insulin aspart  0-5 Units Subcutaneous QHS   insulin aspart  0-6 Units  Subcutaneous TID WC   levothyroxine  50 mcg Oral QAC breakfast   memantine  10 mg Oral BID   pantoprazole  40 mg Oral Daily   QUEtiapine  25 mg Oral QHS   rosuvastatin  10 mg Oral Daily   sevelamer carbonate  2,400 mg Oral TID WC   And   sevelamer carbonate  1,600 mg Oral With snacks    Continuous Infusions:   LOS: 2 days     Kayleen Memos, MD [facility] Pager [phone]  If 7PM-7AM, please contact night-coverage [email or web address] Password [email or web address] 06/08/2022, 1:48 PM

## 2022-06-08 NOTE — Progress Notes (Signed)
Received patient in bed to unit.  Alert and oriented.  Informed consent signed and in chart.   Treatment initiated: [name]  Treatment completed: 1211  Patient tolerated well.  Transported back to the room  Alert, without acute distress.  Hand-off given to patient's nurse.   Access used: R AVF Access issues: none  Total UF removed: 2.4L Medication(s) given: Aranesp 142mg IV Post HD VS: 98.7, 119, 18, 102/85, 100% on RA Post HD weight: 70.2kg   [facility]

## 2022-06-09 DIAGNOSIS — R778 Other specified abnormalities of plasma proteins: Secondary | ICD-10-CM | POA: Diagnosis not present

## 2022-06-09 DIAGNOSIS — R109 Unspecified abdominal pain: Secondary | ICD-10-CM

## 2022-06-09 DIAGNOSIS — E8770 Fluid overload, unspecified: Secondary | ICD-10-CM

## 2022-06-09 DIAGNOSIS — I2510 Atherosclerotic heart disease of native coronary artery without angina pectoris: Secondary | ICD-10-CM

## 2022-06-09 DIAGNOSIS — E038 Other specified hypothyroidism: Secondary | ICD-10-CM

## 2022-06-09 DIAGNOSIS — I480 Paroxysmal atrial fibrillation: Secondary | ICD-10-CM | POA: Diagnosis not present

## 2022-06-09 DIAGNOSIS — E119 Type 2 diabetes mellitus without complications: Secondary | ICD-10-CM

## 2022-06-09 DIAGNOSIS — G309 Alzheimer's disease, unspecified: Secondary | ICD-10-CM

## 2022-06-09 DIAGNOSIS — D649 Anemia, unspecified: Secondary | ICD-10-CM | POA: Diagnosis not present

## 2022-06-09 DIAGNOSIS — N186 End stage renal disease: Secondary | ICD-10-CM

## 2022-06-09 DIAGNOSIS — E785 Hyperlipidemia, unspecified: Secondary | ICD-10-CM

## 2022-06-09 DIAGNOSIS — I5021 Acute systolic (congestive) heart failure: Secondary | ICD-10-CM

## 2022-06-09 DIAGNOSIS — R079 Chest pain, unspecified: Secondary | ICD-10-CM | POA: Diagnosis not present

## 2022-06-09 LAB — CBC
HCT: 25.3 % — ABNORMAL LOW (ref 39.0–52.0)
Hemoglobin: 8.2 g/dL — ABNORMAL LOW (ref 13.0–17.0)
MCH: 32.4 pg (ref 26.0–34.0)
MCHC: 32.4 g/dL (ref 30.0–36.0)
MCV: 100 fL (ref 80.0–100.0)
Platelets: 190 10*3/uL (ref 150–400)
RBC: 2.53 MIL/uL — ABNORMAL LOW (ref 4.22–5.81)
RDW: 17.2 % — ABNORMAL HIGH (ref 11.5–15.5)
WBC: 6.6 10*3/uL (ref 4.0–10.5)
nRBC: 0 % (ref 0.0–0.2)

## 2022-06-09 LAB — RENAL FUNCTION PANEL
Albumin: 2.8 g/dL — ABNORMAL LOW (ref 3.5–5.0)
Anion gap: 8 (ref 5–15)
BUN: 17 mg/dL (ref 8–23)
CO2: 33 mmol/L — ABNORMAL HIGH (ref 22–32)
Calcium: 9.1 mg/dL (ref 8.9–10.3)
Chloride: 97 mmol/L — ABNORMAL LOW (ref 98–111)
Creatinine, Ser: 5.18 mg/dL — ABNORMAL HIGH (ref 0.61–1.24)
GFR, Estimated: 11 mL/min — ABNORMAL LOW (ref >60)
Glucose, Bld: 93 mg/dL (ref 70–99)
Phosphorus: 3.6 mg/dL (ref 2.5–4.6)
Potassium: 3.9 mmol/L (ref 3.5–5.1)
Sodium: 138 mmol/L (ref 135–145)

## 2022-06-09 LAB — GLUCOSE, CAPILLARY
Glucose-Capillary: 91 mg/dL (ref 70–99)
Glucose-Capillary: 112 mg/dL — ABNORMAL HIGH (ref 70–99)
Glucose-Capillary: 124 mg/dL — ABNORMAL HIGH (ref 70–99)
Glucose-Capillary: 148 mg/dL — ABNORMAL HIGH (ref 70–99)

## 2022-06-09 NOTE — Plan of Care (Signed)

## 2022-06-09 NOTE — Progress Notes (Signed)
Admit: 06/06/2022 LOS: 3  14M ESRD with LLQ abd pain, admit for chest pain  Subjective:  Stable overnight, seen in room Ongoing cognitive / memory issues HD yesterday 2.4L   09/15 0701 - 09/16 0700 In: -  Out: 2400   Filed Weights   06/07/22 1904 06/08/22 0740 06/08/22 1211  Weight: 72.4 kg 71.6 kg 70.2 kg    Scheduled Meds:  allopurinol  100 mg Oral Daily   amiodarone  200 mg Oral BID   [START ON 06/22/2022] amiodarone  200 mg Oral Daily   darbepoetin (ARANESP) injection - DIALYSIS  150 mcg Intravenous Q Fri-HD   donepezil  10 mg Oral QHS   feeding supplement (NEPRO CARB STEADY)  237 mL Oral q AM   insulin aspart  0-5 Units Subcutaneous QHS   insulin aspart  0-6 Units Subcutaneous TID WC   levothyroxine  50 mcg Oral QAC breakfast   memantine  10 mg Oral BID   pantoprazole  40 mg Oral Daily   QUEtiapine  25 mg Oral QHS   rosuvastatin  10 mg Oral Daily   sevelamer carbonate  2,400 mg Oral TID WC   And   sevelamer carbonate  1,600 mg Oral With snacks   Continuous Infusions: PRN Meds:.acetaminophen **OR** acetaminophen  Current Labs: reviewed    Physical Exam:  Blood pressure (!) 164/67, pulse 88, temperature 97.6 F (36.4 C), temperature source Oral, resp. rate 17, height '5\' 11"'$  (1.803 m), weight 70.2 kg, SpO2 98 %. General: Well developed, in no acute distress. Chronic dementia. Head: NCAT Lungs: CTAB Heart: RRR  Abdomen: Soft, mildly tender to palpation across lower abdomen without guarding esp on L side Musculoskeletal:  Strength and tone appear normal for age. Lower extremities: No edema or ischemic changes, no open wounds. Neuro: Alert. Moves all extremities spontaneously. Chronic dementia. Dialysis Access: RUE AVF + bruit  Dialysis Orders:  MWF at [facility] 3:45hr, 400/A1.5, EDW 67.5kg, 2K/2Ca, UFP #2, AVF, no heparin - Hectoral 47mg IV q HD - Mircera 576m IV q 2 weeks (last 8/21) - Venofer '50mg'$  IV weekly  Assessment/Plan:  Elevated troponin/?NSTEMI: CP  free now, ongoing non-specific abdominal pain. No further w/u at this time Abd pain: imaging negative, stable  ESRD: Continue HD on MWF schedule - next 9/18, no heparin.  Hypotension/volume: Taking mido pre-HD. No BB at this time d/t hypotension.  Anemia: Hgb 8 - FOBT negative on 9/11. Rec Dar[place] 15625/6/38Metabolic bone disease: Ca/Phos ok - continue home meds for now.  Nutrition:  Alb low, supplements  CAD  T2DM  Dementia  Medication Issues; Preferred narcotic agents for pain control are hydromorphone, fentanyl, and methadone. Morphine should not be used.  Baclofen should be avoided Avoid oral sodium phosphate and magnesium citrate based laxatives / bowel preps    RyPearson GrippeD 06/09/2022, 9:27 AM  Recent Labs  Lab 06/07/22 0259 06/08/22 0800 06/09/22 0210  NA 141 139 138  K 3.5 4.0 3.9  CL 96* 97* 97*  CO2 30 22 33*  GLUCOSE 95 119* 93  BUN 27* 36* 17  CREATININE 7.03* 8.93* 5.18*  CALCIUM 8.6* 8.8* 9.1  PHOS 5.0* 5.6* 3.6   Recent Labs  Lab 06/04/22 1634 06/05/22 1344 06/06/22 1703 06/07/22 0259 06/08/22 0800 06/09/22 0210  WBC 6.7   < > 6.3 5.6 6.9 6.6  NEUTROABS 4.5  --  4.0  --   --   --   HGB 8.6*   < > 9.2* 8.0* 8.4* 8.2*  HCT 25.9*   < > 29.4* 24.2* 26.3* 25.3*  MCV 98.9   < > 105.0* 100.8* 101.5* 100.0  PLT 186   < > 274 179 199 190   < > = values in this interval not displayed.

## 2022-06-09 NOTE — Progress Notes (Addendum)
PROGRESS NOTE    [name]  GQQ:[id number] DOB: 08/07/1941 DOA: 06/06/2022 PCP: Elby Showers, MD    Chief Complaint  Patient presents with   Abdominal Pain    Brief Narrative:  [name] is a 81 y.o. 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  Seen in the ED 2 days prior to presentation for left-sided abdominal pain.  Work-up done at that time revealed 2 g drop in hemoglobin (hemoglobin 10.5 > 8.6) but FOBT negative.  X-ray showing potential small ileus.  He was discharged with plan to follow-up with PCP.  Patient was seen at GI office 06/06/22 for left lower quadrant abdominal pain.  He was tachycardic during this visit and complained of chest pain, sent to the ED for further evaluation.  In the ED, patient was in normal sinus rhythm with rate in the 70s to 80s.     Work-up in the ED revealed elevated troponin and elevated BNP for which cardiology was consulted.  Was not started on heparin drip due to history of GI bleed.  Cardiology and nephrology are following.  Latest HD completed on 06/08/2022.     Assessment & Plan:   Principal Problem:   Chest pain Active Problems:   Elevated troponin   CAD in native artery   Hypothyroidism   ESRD (end stage renal disease) (HCC)   Diabetes mellitus type 2 in nonobese (HCC)   Hyperlipidemia, unspecified   Acute on chronic anemia   Alzheimer's disease, unspecified (CODE) (HCC)   Abdominal pain   Volume overload   Acute systolic heart failure ([place])  #1 resolved chest pain/elevated troponin/history of CAD -Patient noted to have complaints of chest pain, high-sensitivity troponin elevated at 526> 643> 589. -EKG showing T wave flattening in inferior lateral leads which was  new compared to prior EKG but unchanged on repeat EKG. -CT angiogram chest negative for PE. -2D echo with EF of 45 to 50%, left ventricular global hypokinesis, mild LVH, grade 1 diastolic dysfunction, mild to moderate MVR, mild TVR. -Seen by cardiology who recommended not to start IV heparin due to patient's history of AVMs, subdural hematoma, dementia. -Per cardiology elevated troponin relatively flat pattern not suggestive of ACS. -Medical management.   -Continue statin.  Per cardiology.  2.  Paroxysmal atrial fibrillation- CHA2DS2VASC score 6 -Patient started on amiodarone for rhythm suppression currently on 200 mg twice daily. -Patient a poor anticoagulation candidate due to history of GI bleed with chronic anemia, subdural hematoma. -Currently in sinus rhythm. -Per cardiology.  3.  ESRD on HD MWF/volume overload -BNP elevated at 3250, CT showing small pleural effusions.   -Patient noted to have received HD yesterday 06/08/2022. -Per nephrology. -Next HD 06/11/2022  4.  Alzheimer's dementia -Stable -Continue Aricept, Namenda, Seroquel.  5.  Hypothyroidism -Synthroid.  6.  Hypertension -Not on antihypertensive medications as patient noted to be on midodrine on dialysis days for hypotension. -Supportive care.  7.  Hyperlipidemia -Statin.  8.  Non-insulin-dependent type 2 diabetes mellitus -Heme 11 A1c 6.5 (05/17/2022). -Hold oral hypoglycemic agents. -SSI.  9.  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 -Patient also ESRD. -Hemoglobin currently stable at 8.2. -Follow H&H. -  Transfusion threshold hemoglobin < 7.  10.  Resolved left lower quadrant abdominal pain -CT abdomen and pelvis negative for any acute abnormalities. -Abdominal exam unremarkable. -Supportive care.   DVT prophylaxis: SCDs Code Status: DNR Family  Communication: Updated patient.  Updated wife at bedside.   Disposition: Likely home when clinically improved.  Status is: Inpatient Remains inpatient appropriate because: Severity of illness   Consultants:  Nephrology: Dr. Joelyn Oms 06/07/2022 Cardiology: Dr. Marcelle Smiling 06/06/2022  Procedures:  CT angiogram chest 06/06/2022 CT angiogram abdomen and pelvis 06/06/2022 2D echo 06/07/2022  Antimicrobials:  None   Subjective: Sitting up in chair.  States he feels well.  Denies any chest pain.  No shortness of breath.  No abdominal pain.  Tolerating current diet.  Noted to have had HD yesterday.  Objective: Vitals:   06/09/22 0503 06/09/22 0836 06/09/22 0851 06/09/22 1143  BP: (!) 125/53 (!) 164/67  (!) 133/49  Pulse: 71 70 88 63  Resp: '13 17  16  '$ Temp: 97.9 F (36.6 C) 97.6 F (36.4 C)  97.8 F (36.6 C)  TempSrc: Oral Oral  Oral  SpO2: 95% 100% 98% 99%  Weight:      Height:        Intake/Output Summary (Last 24 hours) at 06/09/2022 1529 Last data filed at 06/09/2022 1501 Gross per 24 hour  Intake 480 ml  Output 0 ml  Net 480 ml   Filed Weights   06/07/22 1904 06/08/22 0740 06/08/22 1211  Weight: 72.4 kg 71.6 kg 70.2 kg    Examination:  General exam: Appears calm and comfortable  Respiratory system: Clear to auscultation. Respiratory effort normal. Cardiovascular system: S1 & S2 heard, RRR. No JVD, murmurs, rubs, gallops or clicks. No pedal edema. Gastrointestinal system: Abdomen is nondistended, soft and nontender. No organomegaly or masses felt. Normal bowel sounds heard. Central nervous system: Alert and oriented. No focal neurological deficits. Extremities: Symmetric 5 x 5 power. Skin: No rashes, lesions or ulcers Psychiatry: Judgement and insight appear fair. Mood & affect appropriate.     Data Reviewed: I have personally reviewed following labs and imaging studies  CBC: Recent Labs  Lab 06/04/22 1634 06/05/22 1344 06/06/22 1703 06/07/22 0259 06/08/22 0800  06/09/22 0210  WBC 6.7 6.8 6.3 5.6 6.9 6.6  NEUTROABS 4.5  --  4.0  --   --   --   HGB 8.6* 9.2* 9.2* 8.0* 8.4* 8.2*  HCT 25.9* 27.9* 29.4* 24.2* 26.3* 25.3*  MCV 98.9 99.5 105.0* 100.8* 101.5* 100.0  PLT 186 207.0 274 179 199 536    Basic Metabolic Panel: Recent Labs  Lab 06/04/22 1634 06/05/22 1116 06/06/22 1819 06/07/22 0259 06/08/22 0800 06/09/22 0210  NA 139  --  143 141 139 138  K 3.3*  --  3.7 3.5 4.0 3.9  CL 94*  --  100 96* 97* 97*  CO2 30  --  33* 30 22 33*  GLUCOSE 134* 101* 113* 95 119* 93  BUN 23  --  25* 27* 36* 17  CREATININE 5.35*  --  6.46* 7.03* 8.93* 5.18*  CALCIUM 8.4* 9.2 8.5* 8.6* 8.8* 9.1  PHOS  --   --   --  5.0* 5.6* 3.6    GFR: Estimated Creatinine Clearance: 11.3 mL/min (A) (by C-G formula based on SCr of 5.18 mg/dL (H)).  Liver Function Tests: Recent Labs  Lab 06/04/22 1634 06/06/22 1819 06/07/22 0259 06/08/22 0800 06/09/22 0210  AST 26 21  --   --   --  ALT 11 11  --   --   --   ALKPHOS 126 114  --   --   --   BILITOT 0.3 0.5  --   --   --   PROT 6.3* 5.8*  --   --   --   ALBUMIN 3.0* 2.8* 2.7* 2.7* 2.8*    CBG: Recent Labs  Lab 06/08/22 1251 06/08/22 1629 06/08/22 2123 06/09/22 0614 06/09/22 1140  GLUCAP 76 89 133* 91 112*     No results found for this or any previous visit (from the past 240 hour(s)).       Radiology Studies: No results found.      Scheduled Meds:  allopurinol  100 mg Oral Daily   amiodarone  200 mg Oral BID   [START ON 06/22/2022] amiodarone  200 mg Oral Daily   darbepoetin (ARANESP) injection - DIALYSIS  150 mcg Intravenous Q Fri-HD   donepezil  10 mg Oral QHS   feeding supplement (NEPRO CARB STEADY)  237 mL Oral q AM   insulin aspart  0-5 Units Subcutaneous QHS   insulin aspart  0-6 Units Subcutaneous TID WC   levothyroxine  50 mcg Oral QAC breakfast   memantine  10 mg Oral BID   pantoprazole  40 mg Oral Daily   QUEtiapine  25 mg Oral QHS   rosuvastatin  10 mg Oral Daily    sevelamer carbonate  2,400 mg Oral TID WC   And   sevelamer carbonate  1,600 mg Oral With snacks   Continuous Infusions:   LOS: 3 days    Time spent: 35 minutes    Irine Seal, MD [facility]   To contact the attending provider between 7A-7P or the covering provider during after hours 7P-7A, please log into the web site [email or web address] and access using universal [place] password for that web site. If you do not have the password, please call the hospital operator.  06/09/2022, 3:29 PM

## 2022-06-09 NOTE — Evaluation (Addendum)
Physical Therapy Evaluation Patient Details Name: STIVEN KASPAR MRN: [id number] DOB: 07-Jul-1941 Today's Date: 06/09/2022  History of Present Illness  81 yo admitted 9/13 with abdominal and chest pain. PMhx: GIB, GERD, Alzheimers, ESRD HD MWF, CAD, T2DM, AAA repair SDH, anemia, gout, HTN, HLD, PVD  Clinical Impression  Pt reporting he lives alone, drives and cares for himself with use of rollator. Per chart pt with wife and caregiver at home. Pt currently requires min assist for mobility with increased time to process and for initiation. If pt has supervision at home then return home with HHPT is feasible and if not may need rehab options. Pt with decreased strength, cognition and balance who will benefit from acute therapy to maximize mobility and safety prior to D/C.        Recommendations for follow up therapy are one component of a multi-disciplinary discharge planning process, led by the attending physician.  Recommendations may be updated based on patient status, additional functional criteria and insurance authorization.  Follow Up Recommendations Home health PT      Assistance Recommended at Discharge Frequent or constant Supervision/Assistance  Patient can return home with the following  A little help with walking and/or transfers;A little help with bathing/dressing/bathroom;Assistance with cooking/housework;Direct supervision/assist for financial management;Assist for transportation;Direct supervision/assist for medications management;Help with stairs or ramp for entrance    Equipment Recommendations None recommended by PT  Recommendations for Other Services       Functional Status Assessment Patient has had a recent decline in their functional status and demonstrates the ability to make significant improvements in function in a reasonable and predictable amount of time.     Precautions / Restrictions Precautions Precautions: Fall      Mobility  Bed Mobility Overal bed  mobility: Needs Assistance Bed Mobility: Supine to Sit     Supine to sit: Min guard     General bed mobility comments: HOB 15 degrees, increased time and use of rail. Pt insistent on sitting EOB for 1 min prior to attempting to stand    Transfers Overall transfer level: Needs assistance   Transfers: Sit to/from Stand Sit to Stand: Min assist           General transfer comment: min assist to stand from bed and toilet with cues for hand placement, sequene and safety    Ambulation/Gait Ambulation/Gait assistance: Min guard Gait Distance (Feet): 20 Feet Assistive device: Rolling walker (2 wheels) Gait Pattern/deviations: Step-through pattern, Decreased stride length, Trunk flexed   Gait velocity interpretation: 1.31 - 2.62 ft/sec, indicative of limited community ambulator   General Gait Details: pt walked 20' to and from bathroom with RW and denied further distance  Stairs            Wheelchair Mobility    Modified Rankin (Stroke Patients Only)       Balance Overall balance assessment: Needs assistance, History of Falls Sitting-balance support: No upper extremity supported, Feet supported Sitting balance-Leahy Scale: Fair Sitting balance - Comments: static sitting without assist   Standing balance support: Bilateral upper extremity supported Standing balance-Leahy Scale: Poor Standing balance comment: Rw for gait                             Pertinent Vitals/Pain Pain Assessment Pain Assessment: 0-10 Pain Score: 2  Pain Location: right leg Pain Descriptors / Indicators: Aching Pain Intervention(s): Limited activity within patient's tolerance, Monitored during session, Repositioned    Home Living  Family/patient expects to be discharged to:: Private residence Living Arrangements: Alone Available Help at Discharge: Family;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 hours/day Type of Home: House Home Access: Stairs to enter   [employer] of Steps: 4   Home  Layout: Two level;Able to live on main level with bedroom/bathroom Home Equipment: Rollator (4 wheels);Rolling Walker (2 wheels);Wheelchair - manual;Cane - single point;Transport chair      Prior Function Prior Level of Function : Patient poor historian/Family not available             Mobility Comments: pt reports he uses rollator, drives and cares for himself ADLs Comments: pt states he lives alone and had a girlfriend, does his own shopping, question reliability     Hand Dominance        Extremity/Trunk Assessment   Upper Extremity Assessment Upper Extremity Assessment: Generalized weakness    Lower Extremity Assessment Lower Extremity Assessment: Generalized weakness    Cervical / Trunk Assessment Cervical / Trunk Assessment: Kyphotic  Communication   Communication: No difficulties  Cognition Arousal/Alertness: Awake/alert Behavior During Therapy: WFL for tasks assessed/performed Overall Cognitive Status: Impaired/Different from baseline Area of Impairment: Orientation, Attention, Memory, Following commands, Safety/judgement                 Orientation Level: Disoriented to, Situation, Time Current Attention Level: Sustained Memory: Decreased short-term memory Following Commands: Follows one step commands inconsistently, Follows one step commands with increased time Safety/Judgement: Decreased awareness of safety, Decreased awareness of deficits              General Comments      Exercises     Assessment/Plan    PT Assessment Patient needs continued PT services  PT Problem List Decreased strength;Decreased mobility;Decreased activity tolerance;Decreased cognition;Decreased balance;Decreased knowledge of use of DME       PT Treatment Interventions Gait training;Balance training;Functional mobility training;Therapeutic activities;Patient/family education;Cognitive remediation;Stair training;DME instruction;Therapeutic exercise    PT Goals  (Current goals can be found in the Care Plan section)  Acute Rehab PT Goals Patient Stated Goal: return home PT Goal Formulation: With patient Time For Goal Achievement: 06/23/22 Potential to Achieve Goals: Fair    Frequency Min 3X/week     Co-evaluation               AM-PAC PT "6 Clicks" Mobility  Outcome Measure Help needed turning from your back to your side while in a flat bed without using bedrails?: A Little Help needed moving from lying on your back to sitting on the side of a flat bed without using bedrails?: A Little Help needed moving to and from a bed to a chair (including a wheelchair)?: A Little Help needed standing up from a chair using your arms (e.g., wheelchair or bedside chair)?: A Little Help needed to walk in hospital room?: A Little Help needed climbing 3-5 steps with a railing? : A Lot 6 Click Score: 17    End of Session Equipment Utilized During Treatment: Gait belt Activity Tolerance: Patient tolerated treatment well Patient left: in chair;with call bell/phone within reach;with chair alarm set Nurse Communication: Mobility status PT Visit Diagnosis: Other abnormalities of gait and mobility (R26.89);Difficulty in walking, not elsewhere classified (R26.2);Muscle weakness (generalized) (M62.81)    Time: 0254-2706 PT Time Calculation (min) (ACUTE ONLY): 29 min   Charges:   PT Evaluation $PT Eval Moderate Complexity: 1 Mod PT Treatments $Gait Training: 8-22 mins        [place], PT Acute Rehabilitation Services Office: [place]  B Deniz Eskridge 06/09/2022, 9:25 AM

## 2022-06-09 NOTE — Plan of Care (Signed)
Problem: Education: Goal: Ability to describe self-care measures that may prevent or decrease complications (Diabetes Survival Skills Education) will improve 06/09/2022 0101 by Alvira Monday, RN Outcome: Progressing 06/09/2022 0101 by Alvira Monday, RN Outcome: Progressing Goal: Individualized Educational Video(s) 06/09/2022 0101 by Alvira Monday, RN Outcome: Progressing 06/09/2022 0101 by Alvira Monday, RN Outcome: Progressing   Problem: Coping: Goal: Ability to adjust to condition or change in health will improve 06/09/2022 0101 by Alvira Monday, RN Outcome: Progressing 06/09/2022 0101 by Alvira Monday, RN Outcome: Progressing   Problem: Fluid Volume: Goal: Ability to maintain a balanced intake and output will improve 06/09/2022 0101 by Alvira Monday, RN Outcome: Progressing 06/09/2022 0101 by Alvira Monday, RN Outcome: Progressing   Problem: Health Behavior/Discharge Planning: Goal: Ability to identify and utilize available resources and services will improve 06/09/2022 0101 by Alvira Monday, RN Outcome: Progressing 06/09/2022 0101 by Alvira Monday, RN Outcome: Progressing Goal: Ability to manage health-related needs will improve 06/09/2022 0101 by Alvira Monday, RN Outcome: Progressing 06/09/2022 0101 by Alvira Monday, RN Outcome: Progressing   Problem: Metabolic: Goal: Ability to maintain appropriate glucose levels will improve 06/09/2022 0101 by Alvira Monday, RN Outcome: Progressing 06/09/2022 0101 by Alvira Monday, RN Outcome: Progressing   Problem: Nutritional: Goal: Maintenance of adequate nutrition will improve 06/09/2022 0101 by Alvira Monday, RN Outcome: Progressing 06/09/2022 0101 by Alvira Monday, RN Outcome: Progressing Goal: Progress toward achieving an optimal weight will improve 06/09/2022 0101 by Alvira Monday, RN Outcome: Progressing 06/09/2022 0101 by Alvira Monday, RN Outcome:  Progressing   Problem: Skin Integrity: Goal: Risk for impaired skin integrity will decrease 06/09/2022 0101 by Alvira Monday, RN Outcome: Progressing 06/09/2022 0101 by Alvira Monday, RN Outcome: Progressing   Problem: Tissue Perfusion: Goal: Adequacy of tissue perfusion will improve 06/09/2022 0101 by Alvira Monday, RN Outcome: Progressing 06/09/2022 0101 by Alvira Monday, RN Outcome: Progressing   Problem: Education: Goal: Knowledge of General Education information will improve Description: Including pain rating scale, medication(s)/side effects and non-pharmacologic comfort measures 06/09/2022 0101 by Alvira Monday, RN Outcome: Progressing 06/09/2022 0101 by Alvira Monday, RN Outcome: Progressing   Problem: Health Behavior/Discharge Planning: Goal: Ability to manage health-related needs will improve 06/09/2022 0101 by Alvira Monday, RN Outcome: Progressing 06/09/2022 0101 by Alvira Monday, RN Outcome: Progressing   Problem: Clinical Measurements: Goal: Ability to maintain clinical measurements within normal limits will improve 06/09/2022 0101 by Alvira Monday, RN Outcome: Progressing 06/09/2022 0101 by Alvira Monday, RN Outcome: Progressing Goal: Will remain free from infection 06/09/2022 0101 by Alvira Monday, RN Outcome: Progressing 06/09/2022 0101 by Alvira Monday, RN Outcome: Progressing Goal: Diagnostic test results will improve 06/09/2022 0101 by Alvira Monday, RN Outcome: Progressing 06/09/2022 0101 by Alvira Monday, RN Outcome: Progressing Goal: Respiratory complications will improve 06/09/2022 0101 by Alvira Monday, RN Outcome: Progressing 06/09/2022 0101 by Alvira Monday, RN Outcome: Progressing Goal: Cardiovascular complication will be avoided 06/09/2022 0101 by Alvira Monday, RN Outcome: Progressing 06/09/2022 0101 by Alvira Monday, RN Outcome: Progressing   Problem: Activity: Goal: Risk  for activity intolerance will decrease 06/09/2022 0101 by Alvira Monday, RN Outcome: Progressing 06/09/2022 0101 by Alvira Monday, RN Outcome: Progressing   Problem: Nutrition: Goal: Adequate nutrition will be maintained 06/09/2022 0101 by Alvira Monday, RN Outcome: Progressing 06/09/2022 0101 by Alvira Monday, RN Outcome: Progressing   Problem: Coping: Goal: Level of anxiety will decrease 06/09/2022 0101 by Alvira Monday, RN Outcome: Progressing 06/09/2022 0101 by Alvira Monday, RN Outcome: Progressing   Problem: Elimination: Goal: Will not experience complications related to bowel motility 06/09/2022 0101  by Alvira Monday, RN Outcome: Progressing 06/09/2022 0101 by Alvira Monday, RN Outcome: Progressing Goal: Will not experience complications related to urinary retention 06/09/2022 0101 by Alvira Monday, RN Outcome: Progressing 06/09/2022 0101 by Alvira Monday, RN Outcome: Progressing   Problem: Pain Managment: Goal: General experience of comfort will improve 06/09/2022 0101 by Alvira Monday, RN Outcome: Progressing 06/09/2022 0101 by Alvira Monday, RN Outcome: Progressing   Problem: Safety: Goal: Ability to remain free from injury will improve 06/09/2022 0101 by Alvira Monday, RN Outcome: Progressing 06/09/2022 0101 by Alvira Monday, RN Outcome: Progressing   Problem: Skin Integrity: Goal: Risk for impaired skin integrity will decrease 06/09/2022 0101 by Alvira Monday, RN Outcome: Progressing 06/09/2022 0101 by Alvira Monday, RN Outcome: Progressing   Problem: Education: Goal: Ability to describe self-care measures that may prevent or decrease complications (Diabetes Survival Skills Education) will improve 06/09/2022 0102 by Alvira Monday, RN Outcome: Progressing 06/09/2022 0101 by Alvira Monday, RN Outcome: Progressing 06/09/2022 0101 by Alvira Monday, RN Outcome: Progressing Goal: Individualized  Educational Video(s) 06/09/2022 0102 by Alvira Monday, RN Outcome: Progressing 06/09/2022 0101 by Alvira Monday, RN Outcome: Progressing 06/09/2022 0101 by Alvira Monday, RN Outcome: Progressing   Problem: Coping: Goal: Ability to adjust to condition or change in health will improve 06/09/2022 0102 by Alvira Monday, RN Outcome: Progressing 06/09/2022 0101 by Alvira Monday, RN Outcome: Progressing 06/09/2022 0101 by Alvira Monday, RN Outcome: Progressing   Problem: Fluid Volume: Goal: Ability to maintain a balanced intake and output will improve 06/09/2022 0102 by Alvira Monday, RN Outcome: Progressing 06/09/2022 0101 by Alvira Monday, RN Outcome: Progressing 06/09/2022 0101 by Alvira Monday, RN Outcome: Progressing   Problem: Health Behavior/Discharge Planning: Goal: Ability to identify and utilize available resources and services will improve 06/09/2022 0102 by Alvira Monday, RN Outcome: Progressing 06/09/2022 0101 by Alvira Monday, RN Outcome: Progressing 06/09/2022 0101 by Alvira Monday, RN Outcome: Progressing Goal: Ability to manage health-related needs will improve 06/09/2022 0102 by Alvira Monday, RN Outcome: Progressing 06/09/2022 0101 by Alvira Monday, RN Outcome: Progressing 06/09/2022 0101 by Alvira Monday, RN Outcome: Progressing   Problem: Metabolic: Goal: Ability to maintain appropriate glucose levels will improve 06/09/2022 0102 by Alvira Monday, RN Outcome: Progressing 06/09/2022 0101 by Alvira Monday, RN Outcome: Progressing 06/09/2022 0101 by Alvira Monday, RN Outcome: Progressing   Problem: Nutritional: Goal: Maintenance of adequate nutrition will improve 06/09/2022 0102 by Alvira Monday, RN Outcome: Progressing 06/09/2022 0101 by Alvira Monday, RN Outcome: Progressing 06/09/2022 0101 by Alvira Monday, RN Outcome: Progressing Goal: Progress toward achieving an optimal weight  will improve 06/09/2022 0102 by Alvira Monday, RN Outcome: Progressing 06/09/2022 0101 by Alvira Monday, RN Outcome: Progressing 06/09/2022 0101 by Alvira Monday, RN Outcome: Progressing   Problem: Skin Integrity: Goal: Risk for impaired skin integrity will decrease 06/09/2022 0102 by Alvira Monday, RN Outcome: Progressing 06/09/2022 0101 by Alvira Monday, RN Outcome: Progressing 06/09/2022 0101 by Alvira Monday, RN Outcome: Progressing   Problem: Tissue Perfusion: Goal: Adequacy of tissue perfusion will improve 06/09/2022 0102 by Alvira Monday, RN Outcome: Progressing 06/09/2022 0101 by Alvira Monday, RN Outcome: Progressing 06/09/2022 0101 by Alvira Monday, RN Outcome: Progressing   Problem: Education: Goal: Knowledge of General Education information will improve Description: Including pain rating scale, medication(s)/side effects and non-pharmacologic comfort measures 06/09/2022 0102 by Alvira Monday, RN Outcome: Progressing 06/09/2022 0101 by Alvira Monday, RN Outcome: Progressing 06/09/2022 0101 by Alvira Monday, RN Outcome: Progressing   Problem: Health Behavior/Discharge Planning: Goal: Ability to manage health-related needs will improve 06/09/2022 0102 by Alvira Monday, RN  Outcome: Progressing 06/09/2022 0101 by Alvira Monday, RN Outcome: Progressing 06/09/2022 0101 by Alvira Monday, RN Outcome: Progressing   Problem: Clinical Measurements: Goal: Ability to maintain clinical measurements within normal limits will improve 06/09/2022 0102 by Alvira Monday, RN Outcome: Progressing 06/09/2022 0101 by Alvira Monday, RN Outcome: Progressing 06/09/2022 0101 by Alvira Monday, RN Outcome: Progressing Goal: Will remain free from infection 06/09/2022 0102 by Alvira Monday, RN Outcome: Progressing 06/09/2022 0101 by Alvira Monday, RN Outcome: Progressing 06/09/2022 0101 by Alvira Monday, RN Outcome:  Progressing Goal: Diagnostic test results will improve 06/09/2022 0102 by Alvira Monday, RN Outcome: Progressing 06/09/2022 0101 by Alvira Monday, RN Outcome: Progressing 06/09/2022 0101 by Alvira Monday, RN Outcome: Progressing Goal: Respiratory complications will improve 06/09/2022 0102 by Alvira Monday, RN Outcome: Progressing 06/09/2022 0101 by Alvira Monday, RN Outcome: Progressing 06/09/2022 0101 by Alvira Monday, RN Outcome: Progressing Goal: Cardiovascular complication will be avoided 06/09/2022 0102 by Alvira Monday, RN Outcome: Progressing 06/09/2022 0101 by Alvira Monday, RN Outcome: Progressing 06/09/2022 0101 by Alvira Monday, RN Outcome: Progressing   Problem: Activity: Goal: Risk for activity intolerance will decrease 06/09/2022 0102 by Alvira Monday, RN Outcome: Progressing 06/09/2022 0101 by Alvira Monday, RN Outcome: Progressing 06/09/2022 0101 by Alvira Monday, RN Outcome: Progressing   Problem: Nutrition: Goal: Adequate nutrition will be maintained 06/09/2022 0102 by Alvira Monday, RN Outcome: Progressing 06/09/2022 0101 by Alvira Monday, RN Outcome: Progressing 06/09/2022 0101 by Alvira Monday, RN Outcome: Progressing   Problem: Coping: Goal: Level of anxiety will decrease 06/09/2022 0102 by Alvira Monday, RN Outcome: Progressing 06/09/2022 0101 by Alvira Monday, RN Outcome: Progressing 06/09/2022 0101 by Alvira Monday, RN Outcome: Progressing   Problem: Elimination: Goal: Will not experience complications related to bowel motility 06/09/2022 0102 by Alvira Monday, RN Outcome: Progressing 06/09/2022 0101 by Alvira Monday, RN Outcome: Progressing 06/09/2022 0101 by Alvira Monday, RN Outcome: Progressing Goal: Will not experience complications related to urinary retention 06/09/2022 0102 by Alvira Monday, RN Outcome: Progressing 06/09/2022 0101 by Alvira Monday,  RN Outcome: Progressing 06/09/2022 0101 by Alvira Monday, RN Outcome: Progressing   Problem: Pain Managment: Goal: General experience of comfort will improve 06/09/2022 0102 by Alvira Monday, RN Outcome: Progressing 06/09/2022 0101 by Alvira Monday, RN Outcome: Progressing 06/09/2022 0101 by Alvira Monday, RN Outcome: Progressing   Problem: Safety: Goal: Ability to remain free from injury will improve 06/09/2022 0102 by Alvira Monday, RN Outcome: Progressing 06/09/2022 0101 by Alvira Monday, RN Outcome: Progressing 06/09/2022 0101 by Alvira Monday, RN Outcome: Progressing   Problem: Skin Integrity: Goal: Risk for impaired skin integrity will decrease 06/09/2022 0102 by Alvira Monday, RN Outcome: Progressing 06/09/2022 0101 by Alvira Monday, RN Outcome: Progressing 06/09/2022 0101 by Alvira Monday, RN Outcome: Progressing

## 2022-06-09 NOTE — Evaluation (Signed)
Occupational Therapy Evaluation Patient Details Name: [name] MRN: [id number] DOB: 08-14-1941 Today's Date: 06/09/2022   History of Present Illness 81 yo admitted 9/13 with abdominal and chest pain. PMhx: GIB, GERD, Alzheimers, ESRD HD MWF, CAD, T2DM, AAA repair SDH, anemia, gout, HTN, HLD, PVD   Clinical Impression   Patient admitted for the diagnosis above.  Patient has a history of dementia, and is a poor historian.  Patient back and forth between living with his spouse, and living alone.  Very inconsistent with details, but spouse did call to check on the patient, and PLOF and DME gleaned from the chart.  Patient has the needed 24 hour assist at home, and is probably very close to his baseline.  He presents with poor mentation, balance deficits and safety concerns.  Patient needing anywhere from supervision to Lorraine A for mobility and ADL completion.  No significant OT needs exist in the acute setting.  No HH OT is anticipated.  Recommend the patient be mobilized throughout the day to his tolerance, and be allowed to participate in his own care with appropriate supervision and assist form staff.       Recommendations for follow up therapy are one component of a multi-disciplinary discharge planning process, led by the attending physician.  Recommendations may be updated based on patient status, additional functional criteria and insurance authorization.   Follow Up Recommendations  No OT follow up    Assistance Recommended at Discharge Frequent or constant Supervision/Assistance  Patient can return home with the following A little help with walking and/or transfers;A little help with bathing/dressing/bathroom;Assistance with cooking/housework;Direct supervision/assist for financial management;Assist for transportation;Help with stairs or ramp for entrance;Direct supervision/assist for medications management    Functional Status Assessment  Patient has not had a recent decline in their  functional status  Equipment Recommendations  None recommended by OT    Recommendations for Other Services       Precautions / Restrictions Precautions Precautions: Fall Restrictions Weight Bearing Restrictions: No      Mobility Bed Mobility Overal bed mobility: Needs Assistance             General bed mobility comments: up in recliner    Transfers Overall transfer level: Needs assistance Equipment used: Rolling walker (2 wheels), None Transfers: Sit to/from Stand Sit to Stand: Min guard                  Balance Overall balance assessment: Needs assistance, History of Falls Sitting-balance support: No upper extremity supported, Feet supported Sitting balance-Leahy Scale: Good     Standing balance support: Reliant on assistive device for balance Standing balance-Leahy Scale: Poor                             ADL either performed or assessed with clinical judgement   ADL       Grooming: Wash/dry hands;Wash/dry face;Min guard;Standing           Upper Body Dressing : Set up;Sitting   Lower Body Dressing: Minimal assistance;Sit to/from stand   Toilet Transfer: Min guard;Ambulation;Rolling walker (2 wheels)                   Vision Patient Visual Report: No change from baseline       Perception Perception Perception: Not tested   Praxis Praxis Praxis: Not tested    Pertinent Vitals/Pain Pain Assessment Pain Assessment: Faces Faces Pain Scale: No hurt Pain Intervention(s):  Monitored during session     Hand Dominance Right   Extremity/Trunk Assessment Upper Extremity Assessment Upper Extremity Assessment: Overall WFL for tasks assessed   Lower Extremity Assessment Lower Extremity Assessment: Defer to PT evaluation   Cervical / Trunk Assessment Cervical / Trunk Assessment: Kyphotic   Communication Communication Communication: No difficulties   Cognition Arousal/Alertness: Awake/alert Behavior During Therapy:  WFL for tasks assessed/performed Overall Cognitive Status: History of cognitive impairments - at baseline                                       General Comments   VSS on RA    Exercises     Shoulder Instructions      Home Living Family/patient expects to be discharged to:: Private residence Living Arrangements: Spouse/significant other Available Help at Discharge: Family;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 hours/day Type of Home: House Home Access: Stairs to enter [employer] of Steps: stair lift in the garage Entrance Stairs-Rails: Right;Left;Can reach both Home Layout: Two level;Able to live on main level with bedroom/bathroom     Bathroom Shower/Tub: Tub/shower unit;Sponge bathes at baseline   Bathroom Toilet: Handicapped height Bathroom Accessibility: Yes   Home Equipment: Conservation officer, nature (2 wheels);Hospital bed;Transport chair;Wheelchair - manual;Grab bars - toilet          Prior Functioning/Environment Prior Level of Function : Patient poor historian/Family not available             Mobility Comments: pt reports he uses rollator, drives and cares for himself ADLs Comments: Personal care attendant;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 hours/day ([facility] 5 days/wk MWF 8:30-11am and T,TH 9-12am.)        OT Problem List: Impaired balance (sitting and/or standing);Decreased safety awareness;Decreased cognition      OT Treatment/Interventions:      OT Goals(Current goals can be found in the care plan section) Acute Rehab OT Goals Patient Stated Goal: patient wanting to go home OT Goal Formulation: With patient Time For Goal Achievement: 06/22/22 Potential to Achieve Goals: Good  OT Frequency:      Co-evaluation              AM-PAC OT "6 Clicks" Daily Activity     Outcome Measure Help from another person eating meals?: None Help from another person taking care of personal grooming?: None Help from another person toileting, which includes using toliet, bedpan, or urinal?: A  Little Help from another person bathing (including washing, rinsing, drying)?: A Little Help from another person to put on and taking off regular upper body clothing?: None Help from another person to put on and taking off regular lower body clothing?: A Little 6 Click Score: 21   End of Session Equipment Utilized During Treatment: Rolling walker (2 wheels) Nurse Communication: Mobility status  Activity Tolerance: Patient tolerated treatment well Patient left: in chair;with call bell/phone within reach;with chair alarm set  OT Visit Diagnosis: Unsteadiness on feet (R26.81);Other symptoms and signs involving cognitive function                Time: 3354-5625 OT Time Calculation (min): 17 min Charges:  OT General Charges $OT Visit: 1 Visit OT Evaluation $OT Eval Moderate Complexity: 1 Mod  06/09/2022  RP, OTR/L  Acute Rehabilitation Services  Office:  [phone]   Metta Clines 06/09/2022, 12:23 PM

## 2022-06-09 NOTE — Progress Notes (Signed)
Progress Note  Patient Name: [name] Date of Encounter: 06/09/2022  Primary Cardiologist: Minus Breeding, MD  Subjective   Sitting in bedside chair.  No chest pain or palpitations.  Inpatient Medications    Scheduled Meds:  allopurinol  100 mg Oral Daily   amiodarone  200 mg Oral BID   [START ON 06/22/2022] amiodarone  200 mg Oral Daily   darbepoetin (ARANESP) injection - DIALYSIS  150 mcg Intravenous Q Fri-HD   donepezil  10 mg Oral QHS   feeding supplement (NEPRO CARB STEADY)  237 mL Oral q AM   insulin aspart  0-5 Units Subcutaneous QHS   insulin aspart  0-6 Units Subcutaneous TID WC   levothyroxine  50 mcg Oral QAC breakfast   memantine  10 mg Oral BID   pantoprazole  40 mg Oral Daily   QUEtiapine  25 mg Oral QHS   rosuvastatin  10 mg Oral Daily   sevelamer carbonate  2,400 mg Oral TID WC   And   sevelamer carbonate  1,600 mg Oral With snacks    PRN Meds: acetaminophen **OR** acetaminophen   Vital Signs    Vitals:   06/09/22 0503 06/09/22 0836 06/09/22 0851 06/09/22 1143  BP: (!) 125/53 (!) 164/67  (!) 133/49  Pulse: 71 70 88 63  Resp: '13 17  16  '$ Temp: 97.9 F (36.6 C) 97.6 F (36.4 C)  97.8 F (36.6 C)  TempSrc: Oral Oral  Oral  SpO2: 95% 100% 98% 99%  Weight:      Height:        Intake/Output Summary (Last 24 hours) at 06/09/2022 1242 Last data filed at 06/09/2022 0900 Gross per 24 hour  Intake 120 ml  Output 0 ml  Net 120 ml   Filed Weights   06/07/22 1904 06/08/22 0740 06/08/22 1211  Weight: 72.4 kg 71.6 kg 70.2 kg    Telemetry    Sinus rhythm.  Personally reviewed.  ECG    An ECG dated 06/09/2022 was personally reviewed today and demonstrated:  Sinus rhythm with prolonged PR interval, left anterior fascicular block, normal QTc.  Physical Exam   GEN: No acute distress.   Neck: No JVD. Cardiac: RRR, no murmur, rub, or gallop.  Respiratory: Nonlabored. Clear to auscultation bilaterally. GI: Soft, nontender, bowel sounds  present. MS: No edema; No deformity.  Labs    Chemistry Recent Labs  Lab 06/04/22 1634 06/05/22 1116 06/06/22 1819 06/07/22 0259 06/08/22 0800 06/09/22 0210  NA 139  --  143 141 139 138  K 3.3*  --  3.7 3.5 4.0 3.9  CL 94*  --  100 96* 97* 97*  CO2 30  --  33* 30 22 33*  GLUCOSE 134*   < > 113* 95 119* 93  BUN 23  --  25* 27* 36* 17  CREATININE 5.35*  --  6.46* 7.03* 8.93* 5.18*  CALCIUM 8.4*   < > 8.5* 8.6* 8.8* 9.1  PROT 6.3*  --  5.8*  --   --   --   ALBUMIN 3.0*  --  2.8* 2.7* 2.7* 2.8*  AST 26  --  21  --   --   --   ALT 11  --  11  --   --   --   ALKPHOS 126  --  114  --   --   --   BILITOT 0.3  --  0.5  --   --   --   GFRNONAA 10*  --  8* 7* 5* 11*  ANIONGAP 15  --  10 15 20* 8   < > = values in this interval not displayed.     Hematology Recent Labs  Lab 06/07/22 0259 06/08/22 0800 06/09/22 0210  WBC 5.6 6.9 6.6  RBC 2.40* 2.59* 2.53*  HGB 8.0* 8.4* 8.2*  HCT 24.2* 26.3* 25.3*  MCV 100.8* 101.5* 100.0  MCH 33.3 32.4 32.4  MCHC 33.1 31.9 32.4  RDW 17.4* 17.3* 17.2*  PLT 179 199 190    Cardiac Enzymes Recent Labs  Lab 06/06/22 1703 06/06/22 1814 06/06/22 2315  TROPONINIHS 526* 643* 589*    BNP Recent Labs  Lab 06/06/22 2010  BNP 3,250.3*     Radiology    ECHOCARDIOGRAM COMPLETE  Result Date: 06/07/2022    ECHOCARDIOGRAM REPORT   Patient Name:   [name] Date of Exam: 06/07/2022 Medical Rec #:  [id number]      Height:       67.0 in Accession #:    [id number]     Weight:       156.6 lb Date of Birth:  03-06-1941      BSA:          1.823 m Patient Age:    81 years       BP:           134/90 mmHg Patient Gender: M              HR:           52 bpm. Exam Location:  Inpatient Procedure: 2D Echo, Color Doppler and Cardiac Doppler Indications:    Chest pain/Elevated troponin  History:        Patient has prior history of Echocardiogram examinations, most                 recent 11/07/2020. CAD, Hx of AAA, Signs/Symptoms:Alzheimer's;                  Risk Factors:Diabetes, Dyslipidemia, Hypertension and Sleep                 Apnea.  Sonographer:    Eartha Inch Referring Phys: [id number] [place]  Sonographer Comments: Technically difficult study due to poor echo windows. Image acquisition challenging due to patient body habitus. Pt scanned supine. Could not be positioned in left lateral decubitus due to back pain. IMPRESSIONS  1. LV apical false tendon (normal variant). Left ventricular ejection fraction, by estimation, is 45 to 50%. The left ventricle has mildly decreased function. The left ventricle demonstrates global hypokinesis. There is mild left ventricular hypertrophy. Left ventricular diastolic parameters are consistent with Grade I diastolic dysfunction (impaired relaxation). Elevated left ventricular end-diastolic pressure. The E/e' is 81.  2. Right ventricular systolic function is normal. The right ventricular size is normal. There is mildly elevated pulmonary artery systolic pressure. The estimated right ventricular systolic pressure is 89.2 mmHg.  3. The mitral valve is abnormal. Mild to moderate mitral valve regurgitation.  4. The tricuspid valve is abnormal. Tricuspid valve regurgitation is mild to moderate.  5. The aortic valve is tricuspid. Aortic valve regurgitation is not visualized.  6. The inferior vena cava is dilated in size with <50% respiratory variability, suggesting right atrial pressure of 15 mmHg. Comparison(s): Changes from prior study are noted. 11/07/2020: LVEF 55-60%, RVSP 40 mmHg, trivial MR. FINDINGS  Left Ventricle: LV apical false tendon (normal variant). Left ventricular ejection fraction, by estimation, is 45 to 50%. The left ventricle  has mildly decreased function. The left ventricle demonstrates global hypokinesis. The left ventricular internal  cavity size was normal in size. There is mild left ventricular hypertrophy. Left ventricular diastolic parameters are consistent with Grade I diastolic dysfunction  (impaired relaxation). Elevated left ventricular end-diastolic pressure. The E/e' is 26. Right Ventricle: The right ventricular size is normal. No increase in right ventricular wall thickness. Right ventricular systolic function is normal. There is mildly elevated pulmonary artery systolic pressure. The tricuspid regurgitant velocity is 2.52  m/s, and with an assumed right atrial pressure of 15 mmHg, the estimated right ventricular systolic pressure is 64.3 mmHg. Left Atrium: Left atrial size was normal in size. Right Atrium: Right atrial size was normal in size. Pericardium: There is no evidence of pericardial effusion. Mitral Valve: The mitral valve is abnormal. There is mild thickening of the anterior and posterior mitral valve leaflet(s). Mild mitral annular calcification. Mild to moderate mitral valve regurgitation. Tricuspid Valve: The tricuspid valve is abnormal. Tricuspid valve regurgitation is mild to moderate. Aortic Valve: The aortic valve is tricuspid. Aortic valve regurgitation is not visualized. Pulmonic Valve: The pulmonic valve was normal in structure. Pulmonic valve regurgitation is not visualized. Aorta: The aortic root and ascending aorta are structurally normal, with no evidence of dilitation. Venous: The inferior vena cava is dilated in size with less than 50% respiratory variability, suggesting right atrial pressure of 15 mmHg. IAS/Shunts: No atrial level shunt detected by color flow Doppler.  LEFT VENTRICLE PLAX 2D LVIDd:         3.60 cm     Diastology LVIDs:         3.20 cm     LV e' medial:    5.33 cm/s LV PW:         1.20 cm     LV E/e' medial:  21.2 LV IVS:        1.20 cm     LV e' lateral:   6.53 cm/s LVOT diam:     2.10 cm     LV E/e' lateral: 17.3 LV SV:         73 LV SV Index:   40 LVOT Area:     3.46 cm  LV Volumes (MOD) LV vol d, MOD A2C: 89.0 ml LV vol s, MOD A2C: 52.5 ml LV SV MOD A2C:     36.5 ml RIGHT VENTRICLE             IVC RV S prime:     14.50 cm/s  IVC diam: 2.10 cm TAPSE  (M-mode): 2.6 cm LEFT ATRIUM             Index        RIGHT ATRIUM           Index LA diam:        3.60 cm 1.98 cm/m   RA Area:     17.80 cm LA Vol (A2C):   45.1 ml 24.75 ml/m  RA Volume:   45.10 ml  24.75 ml/m LA Vol (A4C):   49.8 ml 27.32 ml/m LA Biplane Vol: 49.7 ml 27.27 ml/m  AORTIC VALVE LVOT Vmax:   95.00 cm/s LVOT Vmean:  62.500 cm/s LVOT VTI:    0.211 m  AORTA Ao Root diam: 3.40 cm Ao Asc diam:  3.30 cm MITRAL VALVE                  TRICUSPID VALVE MV Area (PHT): 3.74 cm       TR  Peak grad:   25.4 mmHg MV Decel Time: 203 msec       TR Mean grad:   16.0 mmHg MR Peak grad:    141.1 mmHg   TR Vmax:        252.00 cm/s MR Mean grad:    102.0 mmHg   TR Vmean:       193.0 cm/s MR Vmax:         594.00 cm/s MR Vmean:        475.0 cm/s   SHUNTS MR PISA:         1.57 cm     Systemic VTI:  0.21 m MR PISA Eff ROA: 10 mm       Systemic Diam: 2.10 cm MR PISA Radius:  0.50 cm MV E velocity: 113.00 cm/s MV A velocity: 126.00 cm/s MV E/A ratio:  0.90 Lyman Bishop MD Electronically signed by Lyman Bishop MD Signature Date/Time: 06/07/2022/4:00:16 PM    Final      Assessment & Plan    1.  Paroxysmal atrial fibrillation with CHA2DS2-VASc score of 6.  He is a poor candidate for anticoagulation given history of GI bleeding with chronic anemia.  Started on amiodarone for rhythm suppression, currently 200 mg twice daily.  He is maintaining sinus rhythm.  2.  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  GDMT limited by relative hypotension requiring midodrine.  3.  ESRD on hemodialysis.  4.  Mildly elevated high-sensitivity troponin I levels and relatively flat pattern not suggestive of ACS.  5.  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  Continue amiodarone load at 200 mg twice daily for now, also switched from Zocor to Crestor to reduce drug interaction.  No other changes were made today.  Signed, Rozann Lesches, MD  06/09/2022, 12:42 PM

## 2022-06-09 NOTE — Progress Notes (Deleted)
Mobility Specialist Progress Note:   06/09/22 1222  Mobility  Activity Ambulated with assistance in room;Transferred from chair to bed  Level of Assistance Minimal assist, patient does 75% or more  Assistive Device Front wheel walker  Distance Ambulated (ft) 2 ft  Activity Response Tolerated well  $Mobility charge 1 Mobility   Pt received in chair asking to get back to bed. Complaints of chest soreness from chest compressions. MinA to stand from chair. Left in bed with call bell in reach and all needs met.   [facility] Surveyor, mining Chat only

## 2022-06-10 DIAGNOSIS — R778 Other specified abnormalities of plasma proteins: Secondary | ICD-10-CM | POA: Diagnosis not present

## 2022-06-10 DIAGNOSIS — I5021 Acute systolic (congestive) heart failure: Secondary | ICD-10-CM | POA: Diagnosis not present

## 2022-06-10 DIAGNOSIS — I480 Paroxysmal atrial fibrillation: Secondary | ICD-10-CM | POA: Diagnosis not present

## 2022-06-10 DIAGNOSIS — R079 Chest pain, unspecified: Secondary | ICD-10-CM | POA: Diagnosis not present

## 2022-06-10 DIAGNOSIS — G309 Alzheimer's disease, unspecified: Secondary | ICD-10-CM | POA: Diagnosis not present

## 2022-06-10 LAB — RENAL FUNCTION PANEL
Albumin: 2.7 g/dL — ABNORMAL LOW (ref 3.5–5.0)
Anion gap: 11 (ref 5–15)
BUN: 29 mg/dL — ABNORMAL HIGH (ref 8–23)
CO2: 30 mmol/L (ref 22–32)
Calcium: 8.8 mg/dL — ABNORMAL LOW (ref 8.9–10.3)
Chloride: 94 mmol/L — ABNORMAL LOW (ref 98–111)
Creatinine, Ser: 7.23 mg/dL — ABNORMAL HIGH (ref 0.61–1.24)
GFR, Estimated: 7 mL/min — ABNORMAL LOW (ref >60)
Glucose, Bld: 112 mg/dL — ABNORMAL HIGH (ref 70–99)
Phosphorus: 3.6 mg/dL (ref 2.5–4.6)
Potassium: 4.2 mmol/L (ref 3.5–5.1)
Sodium: 135 mmol/L (ref 135–145)

## 2022-06-10 LAB — CBC
HCT: 24.6 % — ABNORMAL LOW (ref 39.0–52.0)
Hemoglobin: 8 g/dL — ABNORMAL LOW (ref 13.0–17.0)
MCH: 32.8 pg (ref 26.0–34.0)
MCHC: 32.5 g/dL (ref 30.0–36.0)
MCV: 100.8 fL — ABNORMAL HIGH (ref 80.0–100.0)
Platelets: 181 10*3/uL (ref 150–400)
RBC: 2.44 MIL/uL — ABNORMAL LOW (ref 4.22–5.81)
RDW: 16.6 % — ABNORMAL HIGH (ref 11.5–15.5)
WBC: 7 10*3/uL (ref 4.0–10.5)
nRBC: 0.3 % — ABNORMAL HIGH (ref 0.0–0.2)

## 2022-06-10 LAB — GLUCOSE, CAPILLARY
Glucose-Capillary: 93 mg/dL (ref 70–99)
Glucose-Capillary: 128 mg/dL — ABNORMAL HIGH (ref 70–99)

## 2022-06-10 MED ORDER — AMIODARONE HCL 200 MG PO TABS: ORAL_TABLET | ORAL | 1 refills | Status: DC

## 2022-06-10 MED ORDER — ROSUVASTATIN CALCIUM 10 MG PO TABS: 10.0000 mg | ORAL_TABLET | Freq: Every day | ORAL | 1 refills | Status: DC

## 2022-06-10 NOTE — Discharge Summary (Signed)
Physician Discharge Summary  [name] OIT:[id number] DOB: 24-Feb-1941 DOA: 06/06/2022  PCP: Elby Showers, MD  Admit date: 06/06/2022 Discharge date: 06/10/2022  Time spent: 60 minutes  Recommendations for Outpatient Follow-up:  Follow-up with Elby Showers, MD in 2 weeks.  Follow-up with Dr. Percival Spanish, cardiology in 2 to 3 weeks for follow-up on cardiac issues including paroxysmal atrial fibrillation.  Patient noted to have been started on amiodarone during this hospitalization. Follow-up in regular hemodialysis center on scheduled day of 06/11/2022.   Discharge Diagnoses:  Principal Problem:   Chest pain Active Problems:   Elevated troponin   CAD in native artery   Hypothyroidism   ESRD (end stage renal disease) ([place])   Diabetes mellitus type 2 in nonobese ([place])   Hyperlipidemia, unspecified   Acute on chronic anemia   Alzheimer's disease, unspecified (CODE) ([facility])   Abdominal pain   Volume overload   Acute systolic heart failure ([place])   Discharge Condition: Stable and improved  Diet recommendation: Heart healthy  Filed Weights   06/07/22 1904 06/08/22 0740 06/08/22 1211  Weight: 72.4 kg 71.6 kg 70.2 kg    History of present illness:  HPI per Dr. Lendon Collar is a 81 y.o. 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  Seen in the ED 2 days ago for left-sided abdominal pain.  Work-up done at that time revealed 2 g drop in hemoglobin (hemoglobin 10.5 > 8.6) but FOBT negative.  X-ray showing potential small ileus.  He was discharged with plan to follow-up with PCP.  Patient was seen at GI office today for left lower quadrant abdominal pain.  He was tachycardic during this visit and complained of chest pain,  sent to the ED for further evaluation.  In the ED, patient was in normal sinus rhythm with rate in the 70s to 80s.  EKG showing sinus rhythm with first-degree AV block (not new), T wave flattening in inferolateral leads new compared to his prior EKG but unchanged on repeat EKG.  Labs showing no leukocytosis, hemoglobin 9.2, high-sensitivity troponin 526> 643, BNP 3250, lipase and LFTs normal.   CTA chest/abdomen/pelvis showing: "IMPRESSION: 1. Negative for acute pulmonary embolus. 2. Small pleural effusions. Mild airspace disease at left lung base, favor atelectasis and or scar over pneumonia 3. No CT evidence for acute intra-abdominal or pelvic abnormality. Atrophic kidneys without hydronephrosis. 4. 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 ED physician spoke to on-call cardiologist who recommended not starting heparin at this time due to patient having history of chronic subdural hematomas.  Recommended obtaining routine echocardiogram, cardiology will consult.  [facility] called to admit.   Patient able to give limited history due to his dementia.  Oriented to self and knows he is at a hospital.  He reports left lower quadrant abdominal pain "for a while."  Denies vomiting or diarrhea.  Denies chest pain.  Reports exertional dyspnea.  Reports going for dialysis every Monday, Wednesday, and Friday.  He does not remember if he went for dialysis today.  Not able to give any additional history.   No family at bedside.  I spoke to the  patient's wife [name] over the phone who informed me that patient usually experiences chest pain and shortness of breath right after dialysis and symptoms do not last very long.  He has been complaining of left-sided abdominal pain for the past 3 weeks which has gotten progressively worse  since last week.  Wife states patient had dark stools for a day about 2 weeks ago but no longer having any dark stools or bloody stools at this time.  He did miss 1 session of dialysis last week as they were on vacation but did receive full dialysis treatment on Monday and 2 hours of treatment today.     Hospital Course:  #1 resolved chest pain/elevated troponin/history of CAD -Patient noted to have complaints of chest pain, high-sensitivity troponin elevated at 526> 643> 589. -EKG showing T wave flattening in inferior lateral leads which was new compared to prior EKG but unchanged on repeat EKG. -CT angiogram chest negative for PE. -2D echo with EF of 45 to 50%, left ventricular global hypokinesis, mild LVH, grade 1 diastolic dysfunction, mild to moderate MVR, mild TVR. -Seen by cardiology who recommended not to start IV heparin due to patient's history of AVMs, subdural hematoma, dementia. -Per cardiology elevated troponin relatively flat pattern not suggestive of ACS. -Medical management.  -Outpatient follow-up with cardiology.   2.  Paroxysmal atrial fibrillation- CHA2DS2VASC score 6 -Patient started on amiodarone for rhythm suppression per cardiology and was started on 200 mg twice daily for 2 weeks and subsequently to decrease to daily thereafter.   -Patient a poor anticoagulation candidate due to history of GI bleed with chronic anemia, subdural hematoma. -Patient was followed by cardiology. -Patient was in sinus rhythm by day of discharge and will follow-up with primary cardiologist in the outpatient setting.   3.  ESRD on HD MWF/volume overload -BNP elevated at 3250, CT showing small pleural effusions.   -Patient noted to have received HD 06/08/2022. -Patient was followed by nephrology during the hospitalization and will follow-up at his outpatient hemodialysis center as scheduled on Monday, 06/11/2022.   4.  Alzheimer's dementia -Stable -Continue Aricept, Namenda, Seroquel.   5.   Hypothyroidism -Patient maintained on home regimen Synthroid.   6.  Hypertension -Not on antihypertensive medications as patient noted to be on midodrine on dialysis days for hypotension. -Supportive care.   7.  Hyperlipidemia -Patient maintained statin during the hospitalization.   8.  Non-insulin-dependent type 2 diabetes mellitus -Heme 11 A1c 6.5 (05/17/2022). -Oral hypoglycemic agents were held and patient maintained on SSI during the hospitalization.    9.  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 -Patient also ESRD. -Hemoglobin currently stable at 8.0 by day of discharge.   -Patient noted to have been placed on  Aranesp per nephrology during HD weekly.  -Follow H&H. -Transfusion threshold hemoglobin < 7.   10.  Resolved left lower quadrant abdominal pain -CT abdomen and pelvis negative for any acute abnormalities. -Abdominal exam unremarkable. -Outpatient follow-up with PCP.    Procedures: CT angiogram chest 06/06/2022 CT angiogram abdomen and pelvis 06/06/2022 2D echo 06/07/2022    Consultations: Nephrology: Dr. Joelyn Oms 06/07/2022 Cardiology: Dr. Marcelle Smiling 06/06/2022    Discharge Exam: Vitals:   06/10/22 0843 06/10/22 1058  BP: 134/67 130/77  Pulse: 70 71  Resp: 20 14  Temp: 97.9 F (36.6 C) 97.7 F (36.5 C)  SpO2: 100% 99%  General: NAD Cardiovascular: Regular rate rhythm no murmurs rubs or gallops.  No JVD.  No lower extremity edema. Respiratory: CTA B.  No wheezes, no crackles, no rhonchi.  Discharge Instructions   Discharge Instructions     Diet - low sodium heart healthy   Complete by: As directed    Increase activity slowly   Complete by: As directed       Allergies as of 06/10/2022       Reactions   Ambien [zolpidem Tartrate] Other (See Comments)   Hallucinations and "felt crazy"    Penicillins  Hives, Rash   Has patient had a PCN reaction causing immediate rash, facial/tongue/throat swelling, SOB or lightheadedness with hypotension: Yes Has patient had a PCN reaction causing severe rash involving mucus membranes or skin necrosis: Yes Has patient had a PCN reaction that required hospitalization: No Has patient had a PCN reaction occurring within the last 10 years: No If all of the above answers are "NO", then may proceed with Cephalosporin use.        Medication List     STOP taking these medications    dicyclomine 10 MG capsule Commonly known as: BENTYL   doxycycline 100 MG capsule Commonly known as: VIBRAMYCIN   lidocaine 5 % Commonly known as: LIDODERM   simvastatin 20 MG tablet Commonly known as: ZOCOR       TAKE these medications    acetaminophen 325 MG tablet Commonly known as: TYLENOL Take 1-2 tablets (325-650 mg total) by mouth every 4 (four) hours as needed for mild pain. What changed:  how much to take when to take this   albuterol 108 (90 Base) MCG/ACT inhaler Commonly known as: VENTOLIN HFA Inhale 1-2 puffs into the lungs daily as needed for wheezing or shortness of breath.   allopurinol 100 MG tablet Commonly known as: ZYLOPRIM Take 100 mg by mouth daily.   amiodarone 200 MG tablet Commonly known as: PACERONE Take 1 tablet (200 mg total) by mouth 2 (two) times daily for 12 days, THEN 1 tablet (200 mg total) daily. Start taking on: June 10, 2022   camphor-menthol lotion Commonly known as: SARNA Apply 1 application topically 4 (four) times daily as needed for itching.   cetirizine 10 MG tablet Commonly known as: ZYRTEC Take 10 mg by mouth daily as needed for allergies.   cyanocobalamin 1000 MCG/ML injection Commonly known as: VITAMIN B12 Inject 1 mL (1,000 mcg total) into the muscle every 30 (thirty) days.   donepezil 10 MG tablet Commonly known as: ARICEPT Take 1 tablet (10 mg total) by mouth at bedtime.   feeding  supplement (NEPRO CARB STEADY) Liqd Take 237 mLs by mouth in the morning.   fluticasone 50 MCG/ACT nasal spray Commonly known as: FLONASE Place 1 spray into both nostrils daily. What changed:  how much to take when to take this reasons to take this   Forteo 600 MCG/2.4ML Sopn Generic drug: Teriparatide (Recombinant) INJECT 20 MCG UNDER THE SKIN DAILY (DISCARD 28 DAYS AFTER INITIAL USE) What changed: See the new instructions.   FreeStyle Freedom Lite w/Device Kit Use to check blood sugar 2 times per day dx code E11.65   freestyle lancets Use as instructed to check blood sugar 2 times per day dx code E11.65   FREESTYLE LITE test strip Generic drug: glucose blood USE AS DIRECTED THREE TIMES DAILY   hydrocortisone 2.5 % cream Apply 1 application topically 3 (three) times daily as needed (skin irritation (legs & arms)).  levothyroxine 50 MCG tablet Commonly known as: SYNTHROID Take 1 tablet (50 mcg total) by mouth daily. What changed: when to take this   memantine 10 MG tablet Commonly known as: NAMENDA Take 1 tablet (10 mg total) by mouth 2 (two) times daily.   midodrine 10 MG tablet Commonly known as: PROAMATINE Take one pill prior to hemodialysis on MWF What changed:  how much to take how to take this when to take this additional instructions   omeprazole 40 MG capsule Commonly known as: PRILOSEC Take 1 capsule (40 mg total) by mouth daily. What changed: when to take this   QUEtiapine 25 MG tablet Commonly known as: SEROquel Take 1 tablet (25 mg total) by mouth at bedtime.   repaglinide 0.5 MG tablet Commonly known as: PRANDIN TAKE 1 TABLET(0.5 MG) BY MOUTH TWICE DAILY BEFORE A MEAL What changed: See the new instructions.   rosuvastatin 10 MG tablet Commonly known as: CRESTOR Take 1 tablet (10 mg total) by mouth daily. Start taking on: June 11, 2022   sevelamer carbonate 800 MG tablet Commonly known as: RENVELA Take 1,600-2,400 mg by mouth See  admin instructions. Take 2,400 mg by mouth three times a day with meals and 1,600 mg with snacks   SYRINGE 3CC/25GX1" 25G X 1" 3 ML Misc 1 each by Does not apply route every 30 (thirty) days.   Theratears 0.25 % Soln Generic drug: Carboxymethylcellulose Sodium Place 1 drop into both eyes 3 (three) times daily as needed (for dryness).   Tradjenta 5 MG Tabs tablet Generic drug: linagliptin Take 1 tablet (5 mg total) by mouth daily.       Allergies  Allergen Reactions   Ambien [Zolpidem Tartrate] Other (See Comments)    Hallucinations and "felt crazy"    Penicillins Hives and Rash    Has patient had a PCN reaction causing immediate rash, facial/tongue/throat swelling, SOB or lightheadedness with hypotension: Yes Has patient had a PCN reaction causing severe rash involving mucus membranes or skin necrosis: Yes Has patient had a PCN reaction that required hospitalization: No Has patient had a PCN reaction occurring within the last 10 years: No If all of the above answers are "NO", then may proceed with Cephalosporin use.     Follow-up Information     Baxley, Cresenciano Lick, MD. Schedule an appointment as soon as possible for a visit in 2 week(s).   Specialty: Internal Medicine Contact information: [address] [place]  [id number] [phone]         Minus Breeding, MD. Schedule an appointment as soon as possible for a visit in 2 week(s).   Specialty: Cardiology Why: f/u in  2-3 weeks. Contact information: [address] [place] [address] [phone]         HD Follow up on 06/11/2022.   Why: Follow-up at regular hemodialysis session as scheduled on 06/11/2022                 The results of significant diagnostics from this hospitalization (including imaging, microbiology, ancillary and laboratory) are listed below for reference.    Significant Diagnostic Studies: ECHOCARDIOGRAM COMPLETE  Result Date: 06/07/2022    ECHOCARDIOGRAM  REPORT   Patient Name:   [name] Date of Exam: 06/07/2022 Medical Rec #:  [id number]      Height:       67.0 in Accession #:    [id number]     Weight:       156.6 lb Date of Birth:  October 21, 1940      BSA:          1.823 m Patient Age:    81 years       BP:           134/90 mmHg Patient Gender: M              HR:           52 bpm. Exam Location:  Inpatient Procedure: 2D Echo, Color Doppler and Cardiac Doppler Indications:    Chest pain/Elevated troponin  History:        Patient has prior history of Echocardiogram examinations, most                 recent 11/07/2020. CAD, Hx of AAA, Signs/Symptoms:Alzheimer's;                 Risk Factors:Diabetes, Dyslipidemia, Hypertension and Sleep                 Apnea.  Sonographer:    Eartha Inch Referring Phys: [id number] [place]  Sonographer Comments: Technically difficult study due to poor echo windows. Image acquisition challenging due to patient body habitus. Pt scanned supine. Could not be positioned in left lateral decubitus due to back pain. IMPRESSIONS  1. LV apical false tendon (normal variant). Left ventricular ejection fraction, by estimation, is 45 to 50%. The left ventricle has mildly decreased function. The left ventricle demonstrates global hypokinesis. There is mild left ventricular hypertrophy. Left ventricular diastolic parameters are consistent with Grade I diastolic dysfunction (impaired relaxation). Elevated left ventricular end-diastolic pressure. The E/e' is 24.  2. Right ventricular systolic function is normal. The right ventricular size is normal. There is mildly elevated pulmonary artery systolic pressure. The estimated right ventricular systolic pressure is 14.4 mmHg.  3. The mitral valve is abnormal. Mild to moderate mitral valve regurgitation.  4. The tricuspid valve is abnormal. Tricuspid valve regurgitation is mild to moderate.  5. The aortic valve is tricuspid. Aortic valve regurgitation is not visualized.  6. The inferior vena cava  is dilated in size with <50% respiratory variability, suggesting right atrial pressure of 15 mmHg. Comparison(s): Changes from prior study are noted. 11/07/2020: LVEF 55-60%, RVSP 40 mmHg, trivial MR. FINDINGS  Left Ventricle: LV apical false tendon (normal variant). Left ventricular ejection fraction, by estimation, is 45 to 50%. The left ventricle has mildly decreased function. The left ventricle demonstrates global hypokinesis. The left ventricular internal  cavity size was normal in size. There is mild left ventricular hypertrophy. Left ventricular diastolic parameters are consistent with Grade I diastolic dysfunction (impaired relaxation). Elevated left ventricular end-diastolic pressure. The E/e' is 44. Right Ventricle: The right ventricular size is normal. No increase in right ventricular wall thickness. Right ventricular systolic function is normal. There is mildly elevated pulmonary artery systolic pressure. The tricuspid regurgitant velocity is 2.52  m/s, and with an assumed right atrial pressure of 15 mmHg, the estimated right ventricular systolic pressure is 31.5 mmHg. Left Atrium: Left atrial size was normal in size. Right Atrium: Right atrial size was normal in size. Pericardium: There is no evidence of pericardial effusion. Mitral Valve: The mitral valve is abnormal. There is mild thickening of the anterior and posterior mitral valve leaflet(s). Mild mitral annular calcification. Mild to moderate mitral valve regurgitation. Tricuspid Valve: The tricuspid valve is abnormal. Tricuspid valve regurgitation is mild to moderate. Aortic Valve: The aortic valve is tricuspid. Aortic valve regurgitation is not visualized. Pulmonic Valve: The pulmonic valve  was normal in structure. Pulmonic valve regurgitation is not visualized. Aorta: The aortic root and ascending aorta are structurally normal, with no evidence of dilitation. Venous: The inferior vena cava is dilated in size with less than 50% respiratory  variability, suggesting right atrial pressure of 15 mmHg. IAS/Shunts: No atrial level shunt detected by color flow Doppler.  LEFT VENTRICLE PLAX 2D LVIDd:         3.60 cm     Diastology LVIDs:         3.20 cm     LV e' medial:    5.33 cm/s LV PW:         1.20 cm     LV E/e' medial:  21.2 LV IVS:        1.20 cm     LV e' lateral:   6.53 cm/s LVOT diam:     2.10 cm     LV E/e' lateral: 17.3 LV SV:         73 LV SV Index:   40 LVOT Area:     3.46 cm  LV Volumes (MOD) LV vol d, MOD A2C: 89.0 ml LV vol s, MOD A2C: 52.5 ml LV SV MOD A2C:     36.5 ml RIGHT VENTRICLE             IVC RV S prime:     14.50 cm/s  IVC diam: 2.10 cm TAPSE (M-mode): 2.6 cm LEFT ATRIUM             Index        RIGHT ATRIUM           Index LA diam:        3.60 cm 1.98 cm/m   RA Area:     17.80 cm LA Vol (A2C):   45.1 ml 24.75 ml/m  RA Volume:   45.10 ml  24.75 ml/m LA Vol (A4C):   49.8 ml 27.32 ml/m LA Biplane Vol: 49.7 ml 27.27 ml/m  AORTIC VALVE LVOT Vmax:   95.00 cm/s LVOT Vmean:  62.500 cm/s LVOT VTI:    0.211 m  AORTA Ao Root diam: 3.40 cm Ao Asc diam:  3.30 cm MITRAL VALVE                  TRICUSPID VALVE MV Area (PHT): 3.74 cm       TR Peak grad:   25.4 mmHg MV Decel Time: 203 msec       TR Mean grad:   16.0 mmHg MR Peak grad:    141.1 mmHg   TR Vmax:        252.00 cm/s MR Mean grad:    102.0 mmHg   TR Vmean:       193.0 cm/s MR Vmax:         594.00 cm/s MR Vmean:        475.0 cm/s   SHUNTS MR PISA:         1.57 cm     Systemic VTI:  0.21 m MR PISA Eff ROA: 10 mm       Systemic Diam: 2.10 cm MR PISA Radius:  0.50 cm MV E velocity: 113.00 cm/s MV A velocity: 126.00 cm/s MV E/A ratio:  0.90 Lyman Bishop MD Electronically signed by Lyman Bishop MD Signature Date/Time: 06/07/2022/4:00:16 PM    Final    CT Angio Chest PE W and/or Wo Contrast  Result Date: 06/06/2022 CLINICAL DATA:  Chest pain short of breath left upper quadrant abdominal  pain EXAM: CT ANGIOGRAPHY CHEST, ABDOMEN AND PELVIS TECHNIQUE: Multidetector CT imaging through  the chest, abdomen and pelvis was performed using the standard protocol during bolus administration of intravenous contrast. Multiplanar reconstructed images and MIPs were obtained and reviewed to evaluate the vascular anatomy. RADIATION DOSE REDUCTION: This exam was performed according to the departmental dose-optimization program which includes automated exposure control, adjustment of the mA and/or kV according to patient size and/or use of iterative reconstruction technique. CONTRAST:  14m OMNIPAQUE IOHEXOL 350 MG/ML SOLN COMPARISON:  Radiograph 06/04/2022, CT abdomen pelvis 04/10/2022, 10/05/2021, CT chest 01/12/2021, CT chest 10/30/2017 FINDINGS: CTA CHEST FINDINGS Cardiovascular: Satisfactory opacification of the pulmonary arteries to the segmental level. No evidence of pulmonary embolism. Advanced aortic atherosclerosis. No aneurysm. Extensive coronary vascular calcification. Borderline cardiac size. No pericardial effusion. Mediastinum/Nodes: Midline trachea. No thyroid mass. No suspicious lymph nodes. Esophagus within normal limits. Calcified mediastinal and hilar nodes consistent with prior granulomatous disease. Lungs/Pleura: Mild emphysema. Small bilateral pleural effusions. Bandlike density at the left lung base likely reflects atelectasis. Partial posterior consolidation, atelectasis versus mild pneumonia. 5 mm ground-glass nodule at the lingula without change and felt benign, no follow-up imaging is recommended Musculoskeletal: Sternum is intact. Mild chronic compression deformities at T1, T3, and T8. Review of the MIP images confirms the above findings. 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 Diminutive heavily calcified native distal aorta and iliac vessels. Heavy calcification of the proximal abdominal aorta without aneurysm. Celiac: Appears diminutive. 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 Variant anatomy. SMA: Heavily  calcified proximal SMA limits evaluation for stenosis. Splenic and right hepatic arteries appear to take their origin from the SMA. No aneurysm, occlusion or dissection. Renals: Single right and single left renal arteries also heavily calcified. Suspect at least moderate stenosis of the proximal arteries. IMA: Poorly visualized due to artifact from surgical clips. Could potentially be chronically occluded, vascular flow enhancement better visualized on the delayed venous images. Inflow: Status post aortoiliac bypass with patency. Heavily calcified diminutive native vessels. Heavily calcified internal iliac vessels. Veins: No obvious venous abnormality within the limitations of this arterial phase study. Review of the MIP images confirms the above findings. NON-VASCULAR Hepatobiliary: No calcified gallstone. Multiple hypodense subcentimeter liver lesions, too small to further characterize. No biliary dilatation Pancreas: Unremarkable. No pancreatic ductal dilatation or surrounding inflammatory changes. Spleen: Normal in size without focal abnormality. Adrenals/Urinary Tract: Adrenal glands are normal. Atrophic kidneys without hydronephrosis. Multiple renal cystic lesions without highly suspicious lesion, no specific imaging follow-up is recommended. The urinary bladder is decompressed but slightly thick-walled and indistinct. Stomach/Bowel: The stomach is nonenlarged. No dilated small bowel. No acute bowel wall thickening. Lymphatic: No suspicious lymph nodes Reproductive: Prostate is unremarkable. Other: Negative for pelvic effusion or free air Musculoskeletal: Hardware in the right femur with artifact. Treated compression deformity at L3. Multiple chronic compression fractures involving L1, L2, L4 and L5. Review of the MIP images confirms the above findings. IMPRESSION: 1. Negative for acute pulmonary embolus. 2. Small pleural effusions. Mild airspace disease at left lung base, favor atelectasis and or scar over  pneumonia 3. No CT evidence for acute intra-abdominal or pelvic abnormality. Atrophic kidneys without hydronephrosis. 4. 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 Electronically Signed   By: KDonavan FoilM.D.   On: 06/06/2022 19:47   CT Angio Abd/Pel  W and/or Wo Contrast  Result Date: 06/06/2022 CLINICAL DATA:  Chest pain short of breath left upper quadrant abdominal pain EXAM: CT ANGIOGRAPHY CHEST, ABDOMEN AND PELVIS TECHNIQUE: Multidetector CT imaging through the chest, abdomen and pelvis was performed using the standard protocol during bolus administration of intravenous contrast. Multiplanar reconstructed images and MIPs were obtained and reviewed to evaluate the vascular anatomy. RADIATION DOSE REDUCTION: This exam was performed according to the departmental dose-optimization program which includes automated exposure control, adjustment of the mA and/or kV according to patient size and/or use of iterative reconstruction technique. CONTRAST:  155m OMNIPAQUE IOHEXOL 350 MG/ML SOLN COMPARISON:  Radiograph 06/04/2022, CT abdomen pelvis 04/10/2022, 10/05/2021, CT chest 01/12/2021, CT chest 10/30/2017 FINDINGS: CTA CHEST FINDINGS Cardiovascular: Satisfactory opacification of the pulmonary arteries to the segmental level. No evidence of pulmonary embolism. Advanced aortic atherosclerosis. No aneurysm. Extensive coronary vascular calcification. Borderline cardiac size. No pericardial effusion. Mediastinum/Nodes: Midline trachea. No thyroid mass. No suspicious lymph nodes. Esophagus within normal limits. Calcified mediastinal and hilar nodes consistent with prior granulomatous disease. Lungs/Pleura: Mild emphysema. Small bilateral pleural effusions. Bandlike density at the left lung base likely reflects atelectasis. Partial posterior consolidation, atelectasis versus mild  pneumonia. 5 mm ground-glass nodule at the lingula without change and felt benign, no follow-up imaging is recommended Musculoskeletal: Sternum is intact. Mild chronic compression deformities at T1, T3, and T8. Review of the MIP images confirms the above findings. 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 Diminutive heavily calcified native distal aorta and iliac vessels. Heavy calcification of the proximal abdominal aorta without aneurysm. Celiac: Appears diminutive. 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 Variant anatomy. SMA: Heavily calcified proximal SMA limits evaluation for stenosis. Splenic and right hepatic arteries appear to take their origin from the SMA. No aneurysm, occlusion or dissection. Renals: Single right and single left renal arteries also heavily calcified. Suspect at least moderate stenosis of the proximal arteries. IMA: Poorly visualized due to artifact from surgical clips. Could potentially be chronically occluded, vascular flow enhancement better visualized on the delayed venous images. Inflow: Status post aortoiliac bypass with patency. Heavily calcified diminutive native vessels. Heavily calcified internal iliac vessels. Veins: No obvious venous abnormality within the limitations of this arterial phase study. Review of the MIP images confirms the above findings. NON-VASCULAR Hepatobiliary: No calcified gallstone. Multiple hypodense subcentimeter liver lesions, too small to further characterize. No biliary dilatation Pancreas: Unremarkable. No pancreatic ductal dilatation or surrounding inflammatory changes. Spleen: Normal in size without focal abnormality. Adrenals/Urinary Tract: Adrenal glands are normal. Atrophic kidneys without hydronephrosis. Multiple renal cystic lesions without highly suspicious lesion, no specific imaging follow-up is recommended. The urinary bladder is decompressed but slightly thick-walled  and indistinct. Stomach/Bowel: The stomach is nonenlarged. No dilated small bowel. No acute bowel wall thickening. Lymphatic: No suspicious lymph nodes Reproductive: Prostate is unremarkable. Other: Negative for pelvic effusion or free air Musculoskeletal: Hardware in the right femur with artifact. Treated compression deformity at L3. Multiple chronic compression fractures involving L1, L2, L4 and L5. Review of the MIP images confirms the above findings. IMPRESSION: 1. Negative for acute pulmonary embolus. 2. Small pleural effusions. Mild airspace disease at left lung base, favor atelectasis and or scar over pneumonia 3. No CT evidence for acute intra-abdominal or pelvic abnormality. Atrophic kidneys without hydronephrosis. 4. 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  Electronically Signed   By: Donavan Foil M.D.   On: 06/06/2022 19:47   DG Abd 2 Views  Result Date: 06/04/2022 CLINICAL DATA:  Abdominal pain EXAM: ABDOMEN - 2 VIEW COMPARISON:  01/27/2007 FINDINGS: Bowel gas pattern is nonspecific. There is presence of gas in few slightly dilated small bowel loops. Stomach is not distended. Gas and stool are present in colon. There is no fecal impaction in rectum. There is no pneumoperitoneum. Extensive arterial calcifications are noted. There is vertebroplasty in L3 vertebra. There is decrease in height of multiple lumbar vertebral bodies. There is previous internal fixation in right femur. Surgical clips are seen in the para-aortic region and left inguinal region. IMPRESSION: Presence of gas in few slightly dilated small bowel loops may suggest ileus. There is no definite evidence of intestinal obstruction or pneumoperitoneum. Other findings as described in the body of the report. Electronically Signed   By: Elmer Picker M.D.   On: 06/04/2022 17:30   CT HEAD WO  CONTRAST (5MM)  Result Date: 05/24/2022 CLINICAL DATA:  Follow-up subdural hematoma EXAM: CT HEAD WITHOUT CONTRAST TECHNIQUE: Contiguous axial images were obtained from the base of the skull through the vertex without intravenous contrast. RADIATION DOSE REDUCTION: This exam was performed according to the departmental dose-optimization program which includes automated exposure control, adjustment of the mA and/or kV according to patient size and/or use of iterative reconstruction technique. COMPARISON:  CT head 04/09/2022 FINDINGS: Brain: Bilateral subdural collections are again seen measuring up to 6 mm over the left frontal lobe and 3 mm over the right frontal lobe, unchanged in size or appearance compared to the study from 04/09/2022. The collections are predominantly hypodense with no evidence of acute blood products. There is no mass effect on the underlying brain parenchyma or midline shift. There is no new acute intracranial hemorrhage or extra-axial fluid collection. There is no acute territorial infarct The ventricles are stable in size. Gray-white differentiation is preserved. There is no mass lesion. Vascular: There is calcification of the bilateral carotid siphons. Skull: Bilateral burr holes are noted. There is no suspicious osseous lesion. Sinuses/Orbits: The paranasal sinuses are clear. Bilateral lens implants are in place. The globes and orbits are otherwise unremarkable. Other: None. IMPRESSION: Stable left larger than right subdural collections since 04/09/2022 with no evidence of new acute blood products or mass effect on the underlying brain parenchyma. Electronically Signed   By: Valetta Mole M.D.   On: 05/24/2022 15:14    Microbiology: No results found for this or any previous visit (from the past 240 hour(s)).   Labs: Basic Metabolic Panel: Recent Labs  Lab 06/06/22 1819 06/07/22 0259 06/08/22 0800 06/09/22 0210 06/10/22 0123  NA 143 141 139 138 135  K 3.7 3.5 4.0 3.9 4.2  CL  100 96* 97* 97* 94*  CO2 33* 30 22 33* 30  GLUCOSE 113* 95 119* 93 112*  BUN 25* 27* 36* 17 29*  CREATININE 6.46* 7.03* 8.93* 5.18* 7.23*  CALCIUM 8.5* 8.6* 8.8* 9.1 8.8*  PHOS  --  5.0* 5.6* 3.6 3.6   Liver Function Tests: Recent Labs  Lab 06/04/22 1634 06/06/22 1819 06/07/22 0259 06/08/22 0800 06/09/22 0210 06/10/22 0123  AST 26 21  --   --   --   --   ALT 11 11  --   --   --   --   ALKPHOS 126 114  --   --   --   --   BILITOT 0.3 0.5  --   --   --   --  PROT 6.3* 5.8*  --   --   --   --   ALBUMIN 3.0* 2.8* 2.7* 2.7* 2.8* 2.7*   Recent Labs  Lab 06/04/22 1634 06/06/22 1819  LIPASE 46 46   No results for input(s): "AMMONIA" in the last 168 hours. CBC: Recent Labs  Lab 06/04/22 1634 06/05/22 1344 06/06/22 1703 06/07/22 0259 06/08/22 0800 06/09/22 0210 06/10/22 0123  WBC 6.7   < > 6.3 5.6 6.9 6.6 7.0  NEUTROABS 4.5  --  4.0  --   --   --   --   HGB 8.6*   < > 9.2* 8.0* 8.4* 8.2* 8.0*  HCT 25.9*   < > 29.4* 24.2* 26.3* 25.3* 24.6*  MCV 98.9   < > 105.0* 100.8* 101.5* 100.0 100.8*  PLT 186   < > 274 179 199 190 181   < > = values in this interval not displayed.   Cardiac Enzymes: No results for input(s): "CKTOTAL", "CKMB", "CKMBINDEX", "TROPONINI" in the last 168 hours. BNP: BNP (last 3 results) Recent Labs    06/06/22 2010  BNP 3,250.3*    ProBNP (last 3 results) No results for input(s): "PROBNP" in the last 8760 hours.  CBG: Recent Labs  Lab 06/09/22 1140 06/09/22 1616 06/09/22 2106 06/10/22 0627 06/10/22 1116  GLUCAP 112* 124* 148* 93 128*       Signed:  Irine Seal MD.  [facility] 06/10/2022, 1:51 PM

## 2022-06-10 NOTE — TOC Transition Note (Signed)
Transition of Care (TOC) - CM/SW Discharge Note [name] RN, BSN Transitions of Care Unit 4E- RN Case Manager See Treatment Team for direct phone #    Patient Details  Name: BEECHER FURIO MRN: [id number] Date of Birth: [birth year]  [facility] CM/SW Contact:  Dawayne Patricia, RN Phone Number: 06/10/2022, 2:44 PM   Clinical Narrative:    Pt stable for transition home today. HHPT/OT orders placed. Pt confused per staff- TC made to wife to discuss transition needs.  Discussed HH with wife over TC- wife agreeable to [facility] services Choice offered Per CMS guidelines from medicare.gov website with star ratings (copy placed in shadow chart), per wife she would like to use [facility] for [facility] needs.  Wife reports patient has needed DME in the home- no new needs at this time. Wife to come transport home- estimated time around 4pm- bedside RN aware.   Address, phone # and PCP all confirmed in epic w/ wife.   Call made to [facility] for [facility] referral- referral has been accepted.    Final next level of care: [place] Barriers to Discharge: No Barriers Identified   Patient Goals and CMS Choice Patient states their goals for this hospitalization and ongoing recovery are:: return home CMS Medicare.gov Compare Post Acute Care list provided to:: Patient Choice offered to / list presented to : Spouse  Discharge Placement               Home w/ [facility]        Discharge Plan and Services   Discharge Planning Services: CM Consult Post Acute Care Choice: Home Health          DME Arranged: N/A DME Agency: NA       HH Arranged: PT, OT [place] Agency: [place] Date [place]: 06/10/22 Time [place]: 1440 Representative spoke with at [place]: [place] ([place]) Interventions     Readmission Risk Interventions    06/10/2022    2:43 PM 11/06/2021   12:09 PM 10/05/2021   11:33 AM  Readmission Risk  Prevention Plan  Transportation Screening Complete Complete   PCP or Specialist Appt within 3-5 Days  Complete   HRI or [place]  Complete   Social Work Consult for [place] Planning/Counseling  Complete   Palliative Care Screening  Not Applicable   Medication Review Press photographer) Complete Complete   PCP or Specialist appointment within 3-5 days of discharge Complete    HRI or [place] Complete    SW Recovery Care/Counseling Consult Complete  Complete  [place] Not Applicable

## 2022-06-10 NOTE — Progress Notes (Signed)
Progress Note  Patient Name: [name] Date of Encounter: 06/10/2022  Primary Cardiologist: Minus Breeding, MD  Subjective   No palpitations or chest pain.  Inpatient Medications    Scheduled Meds:  allopurinol  100 mg Oral Daily   amiodarone  200 mg Oral BID   [START ON 06/22/2022] amiodarone  200 mg Oral Daily   darbepoetin (ARANESP) injection - DIALYSIS  150 mcg Intravenous Q Fri-HD   donepezil  10 mg Oral QHS   feeding supplement (NEPRO CARB STEADY)  237 mL Oral q AM   insulin aspart  0-5 Units Subcutaneous QHS   insulin aspart  0-6 Units Subcutaneous TID WC   levothyroxine  50 mcg Oral QAC breakfast   memantine  10 mg Oral BID   pantoprazole  40 mg Oral Daily   QUEtiapine  25 mg Oral QHS   rosuvastatin  10 mg Oral Daily   sevelamer carbonate  2,400 mg Oral TID WC   And   sevelamer carbonate  1,600 mg Oral With snacks    PRN Meds: acetaminophen **OR** acetaminophen   Vital Signs    Vitals:   06/09/22 1620 06/09/22 2110 06/09/22 2345 06/10/22 0843  BP: (!) 143/49 (!) 158/57 (!) 138/57 134/67  Pulse: 65 69 66 70  Resp: '18 18 20 20  '$ Temp: 97.8 F (36.6 C) 97.8 F (36.6 C) 97.8 F (36.6 C) 97.9 F (36.6 C)  TempSrc: Oral Oral Oral Oral  SpO2: 97% 99% 98% 100%  Weight:      Height:        Intake/Output Summary (Last 24 hours) at 06/10/2022 1019 Last data filed at 06/09/2022 1501 Gross per 24 hour  Intake 360 ml  Output 0 ml  Net 360 ml   Filed Weights   06/07/22 1904 06/08/22 0740 06/08/22 1211  Weight: 72.4 kg 71.6 kg 70.2 kg    Telemetry    Sinus rhythm.  Personally reviewed.  ECG    An ECG dated 06/09/2022 was personally reviewed today and demonstrated:  Sinus rhythm with prolonged PR interval, left anterior fascicular block, normal QTc.  Physical Exam   GEN: No acute distress.   Neck: No JVD. Cardiac: RRR without murmur or gallop.  Respiratory: Nonlabored.  Clear to auscultation anteriorly. GI: Soft, nontender, bowel sounds  present. MS: No edema.  Labs    Chemistry Recent Labs  Lab 06/04/22 1634 06/05/22 1116 06/06/22 1819 06/07/22 0259 06/08/22 0800 06/09/22 0210 06/10/22 0123  NA 139  --  143   < > 139 138 135  K 3.3*  --  3.7   < > 4.0 3.9 4.2  CL 94*  --  100   < > 97* 97* 94*  CO2 30  --  33*   < > 22 33* 30  GLUCOSE 134*   < > 113*   < > 119* 93 112*  BUN 23  --  25*   < > 36* 17 29*  CREATININE 5.35*  --  6.46*   < > 8.93* 5.18* 7.23*  CALCIUM 8.4*   < > 8.5*   < > 8.8* 9.1 8.8*  PROT 6.3*  --  5.8*  --   --   --   --   ALBUMIN 3.0*  --  2.8*   < > 2.7* 2.8* 2.7*  AST 26  --  21  --   --   --   --   ALT 11  --  11  --   --   --   --  ALKPHOS 126  --  114  --   --   --   --   BILITOT 0.3  --  0.5  --   --   --   --   GFRNONAA 10*  --  8*   < > 5* 11* 7*  ANIONGAP 15  --  10   < > 20* 8 11   < > = values in this interval not displayed.     Hematology Recent Labs  Lab 06/08/22 0800 06/09/22 0210 06/10/22 0123  WBC 6.9 6.6 7.0  RBC 2.59* 2.53* 2.44*  HGB 8.4* 8.2* 8.0*  HCT 26.3* 25.3* 24.6*  MCV 101.5* 100.0 100.8*  MCH 32.4 32.4 32.8  MCHC 31.9 32.4 32.5  RDW 17.3* 17.2* 16.6*  PLT 199 190 181    Cardiac Enzymes Recent Labs  Lab 06/06/22 1703 06/06/22 1814 06/06/22 2315  TROPONINIHS 526* 643* 589*    BNP Recent Labs  Lab 06/06/22 2010  BNP 3,250.3*     Radiology    No results found.  Assessment & Plan    1.  Paroxysmal atrial fibrillation with CHA2DS2-VASc score of 6.  He is a poor candidate for anticoagulation given history of GI bleeding with chronic anemia.  Started on amiodarone load for rhythm suppression, currently 200 mg twice daily.  He is maintaining sinus rhythm.  Telemetry reviewed.  2.  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  GDMT limited by relative hypotension requiring midodrine.  3.  ESRD on hemodialysis.  4.  Mildly elevated high-sensitivity troponin I levels and relatively flat pattern not suggestive  of ACS.  5.  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  Continue amiodarone 200 mg twice daily for now (2-week course then down to once daily per Dr. Oval Linsey).  Signed, Rozann Lesches, MD  06/10/2022, 10:19 AM

## 2022-06-10 NOTE — Progress Notes (Signed)
Pt discharged home with family. [name] [name] received AVS and discharge instructions. All questions answered. IV and telemetry box removed. Pt left with all belongings. Pt discharged via wheelchair and was accompanied by this RN.

## 2022-06-10 NOTE — Progress Notes (Signed)
Admit: 06/06/2022 LOS: 4  17M ESRD with LLQ abd pain, admit for chest pain  Subjective:  Stable overnight, seen in room, no c/o Ongoing cognitive / memory issues  09/16 0701 - 09/17 0700 In: 480 [P.O.:480] Out: 0   Filed Weights   06/07/22 1904 06/08/22 0740 06/08/22 1211  Weight: 72.4 kg 71.6 kg 70.2 kg    Scheduled Meds:  allopurinol  100 mg Oral Daily   amiodarone  200 mg Oral BID   [START ON 06/22/2022] amiodarone  200 mg Oral Daily   darbepoetin (ARANESP) injection - DIALYSIS  150 mcg Intravenous Q Fri-HD   donepezil  10 mg Oral QHS   feeding supplement (NEPRO CARB STEADY)  237 mL Oral q AM   insulin aspart  0-5 Units Subcutaneous QHS   insulin aspart  0-6 Units Subcutaneous TID WC   levothyroxine  50 mcg Oral QAC breakfast   memantine  10 mg Oral BID   pantoprazole  40 mg Oral Daily   QUEtiapine  25 mg Oral QHS   rosuvastatin  10 mg Oral Daily   sevelamer carbonate  2,400 mg Oral TID WC   And   sevelamer carbonate  1,600 mg Oral With snacks   Continuous Infusions: PRN Meds:.acetaminophen **OR** acetaminophen  Current Labs: reviewed    Physical Exam:  Blood pressure 134/67, pulse 70, temperature 97.9 F (36.6 C), temperature source Oral, resp. rate 20, height '5\' 11"'$  (1.803 m), weight 70.2 kg, SpO2 100 %. General: Well developed, in no acute distress. Chronic dementia. Head: NCAT Lungs: CTAB Heart: RRR  Abdomen: Soft, mildly tender to palpation across lower abdomen without guarding esp on L side Musculoskeletal:  Strength and tone appear normal for age. Lower extremities: No edema or ischemic changes, no open wounds. Neuro: Alert. Moves all extremities spontaneously. Chronic dementia. Dialysis Access: RUE AVF + bruit  Dialysis Orders:  MWF at [facility] 3:45hr, 400/A1.5, EDW 67.5kg, 2K/2Ca, UFP #2, AVF, no heparin - Hectoral 62mg IV q HD - Mircera 561m IV q 2 weeks (last 8/21) - Venofer '50mg'$  IV weekly  Assessment/Plan:  Elevated troponin/?NSTEMI: CP free  now, ongoing non-specific abdominal pain. No further w/u at this time Abd pain: imaging negative, stable  ESRD: Continue HD on MWF schedule - next 9/18, no heparin.  Hypotension/volume: Taking mido pre-HD. No BB at this time d/t hypotension.  Anemia: Hgb 8 - FOBT negative on 9/11. Rec [place] 15536/6/44Metabolic bone disease: Ca/Phos ok - continue home meds for now.  Nutrition:  Alb low, supplements  CAD  T2DM  Dementia  Medication Issues; Preferred narcotic agents for pain control are hydromorphone, fentanyl, and methadone. Morphine should not be used.  Baclofen should be avoided Avoid oral sodium phosphate and magnesium citrate based laxatives / bowel preps    RyPearson GrippeD 06/10/2022, 10:19 AM  Recent Labs  Lab 06/08/22 0800 06/09/22 0210 06/10/22 0123  NA 139 138 135  K 4.0 3.9 4.2  CL 97* 97* 94*  CO2 22 33* 30  GLUCOSE 119* 93 112*  BUN 36* 17 29*  CREATININE 8.93* 5.18* 7.23*  CALCIUM 8.8* 9.1 8.8*  PHOS 5.6* 3.6 3.6    Recent Labs  Lab 06/04/22 1634 06/05/22 1344 06/06/22 1703 06/07/22 0259 06/08/22 0800 06/09/22 0210 06/10/22 0123  WBC 6.7   < > 6.3   < > 6.9 6.6 7.0  NEUTROABS 4.5  --  4.0  --   --   --   --   HGB 8.6*   < >  9.2*   < > 8.4* 8.2* 8.0*  HCT 25.9*   < > 29.4*   < > 26.3* 25.3* 24.6*  MCV 98.9   < > 105.0*   < > 101.5* 100.0 100.8*  PLT 186   < > 274   < > 199 190 181   < > = values in this interval not displayed.

## 2022-06-11 ENCOUNTER — Telehealth: Payer: Self-pay

## 2022-06-11 DIAGNOSIS — N186 End stage renal disease: Secondary | ICD-10-CM | POA: Diagnosis not present

## 2022-06-11 DIAGNOSIS — N2581 Secondary hyperparathyroidism of renal origin: Secondary | ICD-10-CM | POA: Diagnosis not present

## 2022-06-11 DIAGNOSIS — Z992 Dependence on renal dialysis: Secondary | ICD-10-CM | POA: Diagnosis not present

## 2022-06-11 DIAGNOSIS — E876 Hypokalemia: Secondary | ICD-10-CM | POA: Diagnosis not present

## 2022-06-11 DIAGNOSIS — D631 Anemia in chronic kidney disease: Secondary | ICD-10-CM | POA: Diagnosis not present

## 2022-06-11 NOTE — Telephone Encounter (Signed)
  Called patient and no answer will try again tomorrow

## 2022-06-12 ENCOUNTER — Telehealth: Payer: Self-pay | Admitting: Internal Medicine

## 2022-06-12 ENCOUNTER — Telehealth: Payer: Self-pay

## 2022-06-12 DIAGNOSIS — E1122 Type 2 diabetes mellitus with diabetic chronic kidney disease: Secondary | ICD-10-CM | POA: Diagnosis not present

## 2022-06-12 DIAGNOSIS — I480 Paroxysmal atrial fibrillation: Secondary | ICD-10-CM | POA: Diagnosis not present

## 2022-06-12 DIAGNOSIS — D500 Iron deficiency anemia secondary to blood loss (chronic): Secondary | ICD-10-CM | POA: Diagnosis not present

## 2022-06-12 DIAGNOSIS — E039 Hypothyroidism, unspecified: Secondary | ICD-10-CM | POA: Diagnosis not present

## 2022-06-12 DIAGNOSIS — E1165 Type 2 diabetes mellitus with hyperglycemia: Secondary | ICD-10-CM | POA: Diagnosis not present

## 2022-06-12 DIAGNOSIS — I700 Atherosclerosis of aorta: Secondary | ICD-10-CM | POA: Diagnosis not present

## 2022-06-12 DIAGNOSIS — I440 Atrioventricular block, first degree: Secondary | ICD-10-CM | POA: Diagnosis not present

## 2022-06-12 DIAGNOSIS — I2510 Atherosclerotic heart disease of native coronary artery without angina pectoris: Secondary | ICD-10-CM | POA: Diagnosis not present

## 2022-06-12 DIAGNOSIS — F02818 Dementia in other diseases classified elsewhere, unspecified severity, with other behavioral disturbance: Secondary | ICD-10-CM | POA: Diagnosis not present

## 2022-06-12 DIAGNOSIS — Z8679 Personal history of other diseases of the circulatory system: Secondary | ICD-10-CM | POA: Diagnosis not present

## 2022-06-12 DIAGNOSIS — E785 Hyperlipidemia, unspecified: Secondary | ICD-10-CM | POA: Diagnosis not present

## 2022-06-12 DIAGNOSIS — G309 Alzheimer's disease, unspecified: Secondary | ICD-10-CM | POA: Diagnosis not present

## 2022-06-12 DIAGNOSIS — I5023 Acute on chronic systolic (congestive) heart failure: Secondary | ICD-10-CM | POA: Diagnosis not present

## 2022-06-12 DIAGNOSIS — M109 Gout, unspecified: Secondary | ICD-10-CM | POA: Diagnosis not present

## 2022-06-12 DIAGNOSIS — Z87891 Personal history of nicotine dependence: Secondary | ICD-10-CM | POA: Diagnosis not present

## 2022-06-12 DIAGNOSIS — E1151 Type 2 diabetes mellitus with diabetic peripheral angiopathy without gangrene: Secondary | ICD-10-CM | POA: Diagnosis not present

## 2022-06-12 DIAGNOSIS — Z85828 Personal history of other malignant neoplasm of skin: Secondary | ICD-10-CM | POA: Diagnosis not present

## 2022-06-12 DIAGNOSIS — K449 Diaphragmatic hernia without obstruction or gangrene: Secondary | ICD-10-CM | POA: Diagnosis not present

## 2022-06-12 DIAGNOSIS — I959 Hypotension, unspecified: Secondary | ICD-10-CM | POA: Diagnosis not present

## 2022-06-12 DIAGNOSIS — I130 Hypertensive heart and chronic kidney disease with heart failure and stage 1 through stage 4 chronic kidney disease, or unspecified chronic kidney disease: Secondary | ICD-10-CM | POA: Diagnosis not present

## 2022-06-12 DIAGNOSIS — Z8719 Personal history of other diseases of the digestive system: Secondary | ICD-10-CM | POA: Diagnosis not present

## 2022-06-12 DIAGNOSIS — G4730 Sleep apnea, unspecified: Secondary | ICD-10-CM | POA: Diagnosis not present

## 2022-06-12 DIAGNOSIS — N186 End stage renal disease: Secondary | ICD-10-CM | POA: Diagnosis not present

## 2022-06-12 DIAGNOSIS — K219 Gastro-esophageal reflux disease without esophagitis: Secondary | ICD-10-CM | POA: Diagnosis not present

## 2022-06-12 DIAGNOSIS — D631 Anemia in chronic kidney disease: Secondary | ICD-10-CM | POA: Diagnosis not present

## 2022-06-12 NOTE — Telephone Encounter (Signed)
Herbert Deaner - Physical Therapist [phone]  Clair Gulling called to get verbal orders, he went out to [employer] today.  Physical Therapy 1X8WK [facility]. Reduce appetite,  Occupation Therapy will come out to evaluate.  They were asking what should they do about Chronic headache? What should they do about low hemoglobin?

## 2022-06-12 NOTE — Telephone Encounter (Signed)
Transition Care Management Follow-up Telephone Call Date of discharge and from where: 06/10/22 How have you been since you were released from the hospital? Fine  Any questions or concerns? No  Items Reviewed: Did the pt receive and understand the discharge instructions provided? Yes  Medications obtained and verified? Yes  Other? Yes  Any new allergies since your discharge? No  Dietary orders reviewed? Yes Do you have support at home? Yes   Home Care and Equipment/Supplies: Were home health services ordered? yes If so, what is the name of the agency? [facility]  Has the agency set up a time to come to the patient's home? yes Were any new equipment or medical supplies ordered?  N/A What is the name of the medical supply agency? N/a Were you able to get the supplies/equipment? not applicable Do you have any questions related to the use of the equipment or supplies? N/a  Functional Questionnaire: (I = Independent and D = Dependent) ADLs: D  Bathing/Dressing- D  Meal Prep- D  Eating- D  Maintaining continence- D  Transferring/Ambulation- D  Managing Meds- D  Follow up appointments reviewed:  PCP Hospital f/u appt confirmed? Yes  Scheduled to see  on 06/19/22 @ 12PM. [facility] f/u appt confirmed? Yes  Scheduled to see cardiology  Are transportation arrangements needed? No  If their condition worsens, is the pt aware to call PCP or go to the Emergency Dept.? Yes Was the patient provided with contact information for the PCP's office or ED? Yes Was to pt encouraged to call back with questions or concerns? Yes

## 2022-06-13 DIAGNOSIS — N2581 Secondary hyperparathyroidism of renal origin: Secondary | ICD-10-CM | POA: Diagnosis not present

## 2022-06-13 DIAGNOSIS — E876 Hypokalemia: Secondary | ICD-10-CM | POA: Diagnosis not present

## 2022-06-13 DIAGNOSIS — N186 End stage renal disease: Secondary | ICD-10-CM | POA: Diagnosis not present

## 2022-06-13 DIAGNOSIS — D631 Anemia in chronic kidney disease: Secondary | ICD-10-CM | POA: Diagnosis not present

## 2022-06-13 DIAGNOSIS — Z992 Dependence on renal dialysis: Secondary | ICD-10-CM | POA: Diagnosis not present

## 2022-06-13 NOTE — Telephone Encounter (Signed)
Herbert Deaner Physical Therapist -Alvis Lemmings [phone]  Clair Gulling called back to give drug interactions. FYI  Level II drugs  Amiodarone interacts with Butalbital and Donepezil  Butalbital interacts with Quetiapine and Tradjenta  Donepezil interacts with Quetiapine

## 2022-06-13 NOTE — Telephone Encounter (Signed)
Called and spoke with Clair Gulling and let him know what Dr Renold Genta had said, he verbalized understanding.

## 2022-06-14 ENCOUNTER — Inpatient Hospital Stay (HOSPITAL_COMMUNITY)
Admission: EM | Admit: 2022-06-14 | Discharge: 2022-06-17 | DRG: 311 | Disposition: A | Discharge status: Home Health | Payer: Medicare Other | Attending: Family Medicine | Admitting: Family Medicine

## 2022-06-14 ENCOUNTER — Emergency Department (HOSPITAL_COMMUNITY): Payer: Medicare Other

## 2022-06-14 ENCOUNTER — Encounter (HOSPITAL_COMMUNITY): Payer: Self-pay | Admitting: Emergency Medicine

## 2022-06-14 ENCOUNTER — Other Ambulatory Visit: Payer: Self-pay

## 2022-06-14 DIAGNOSIS — I081 Rheumatic disorders of both mitral and tricuspid valves: Secondary | ICD-10-CM | POA: Diagnosis not present

## 2022-06-14 DIAGNOSIS — I9589 Other hypotension: Secondary | ICD-10-CM | POA: Diagnosis present

## 2022-06-14 DIAGNOSIS — E039 Hypothyroidism, unspecified: Secondary | ICD-10-CM | POA: Diagnosis present

## 2022-06-14 DIAGNOSIS — Z992 Dependence on renal dialysis: Secondary | ICD-10-CM | POA: Diagnosis not present

## 2022-06-14 DIAGNOSIS — E1129 Type 2 diabetes mellitus with other diabetic kidney complication: Secondary | ICD-10-CM | POA: Diagnosis not present

## 2022-06-14 DIAGNOSIS — Z8679 Personal history of other diseases of the circulatory system: Secondary | ICD-10-CM

## 2022-06-14 DIAGNOSIS — I2510 Atherosclerotic heart disease of native coronary artery without angina pectoris: Secondary | ICD-10-CM | POA: Diagnosis not present

## 2022-06-14 DIAGNOSIS — R001 Bradycardia, unspecified: Secondary | ICD-10-CM | POA: Diagnosis present

## 2022-06-14 DIAGNOSIS — I132 Hypertensive heart and chronic kidney disease with heart failure and with stage 5 chronic kidney disease, or end stage renal disease: Secondary | ICD-10-CM | POA: Diagnosis present

## 2022-06-14 DIAGNOSIS — Z66 Do not resuscitate: Secondary | ICD-10-CM | POA: Diagnosis present

## 2022-06-14 DIAGNOSIS — D638 Anemia in other chronic diseases classified elsewhere: Secondary | ICD-10-CM | POA: Diagnosis present

## 2022-06-14 DIAGNOSIS — F0390 Unspecified dementia without behavioral disturbance: Secondary | ICD-10-CM | POA: Diagnosis not present

## 2022-06-14 DIAGNOSIS — K5520 Angiodysplasia of colon without hemorrhage: Secondary | ICD-10-CM | POA: Diagnosis present

## 2022-06-14 DIAGNOSIS — J90 Pleural effusion, not elsewhere classified: Secondary | ICD-10-CM | POA: Diagnosis not present

## 2022-06-14 DIAGNOSIS — I248 Other forms of acute ischemic heart disease: Principal | ICD-10-CM | POA: Diagnosis present

## 2022-06-14 DIAGNOSIS — Z880 Allergy status to penicillin: Secondary | ICD-10-CM

## 2022-06-14 DIAGNOSIS — I739 Peripheral vascular disease, unspecified: Secondary | ICD-10-CM | POA: Diagnosis not present

## 2022-06-14 DIAGNOSIS — Z79899 Other long term (current) drug therapy: Secondary | ICD-10-CM | POA: Diagnosis not present

## 2022-06-14 DIAGNOSIS — Z888 Allergy status to other drugs, medicaments and biological substances status: Secondary | ICD-10-CM

## 2022-06-14 DIAGNOSIS — N2581 Secondary hyperparathyroidism of renal origin: Secondary | ICD-10-CM | POA: Diagnosis not present

## 2022-06-14 DIAGNOSIS — R778 Other specified abnormalities of plasma proteins: Secondary | ICD-10-CM | POA: Diagnosis not present

## 2022-06-14 DIAGNOSIS — E1165 Type 2 diabetes mellitus with hyperglycemia: Secondary | ICD-10-CM | POA: Diagnosis not present

## 2022-06-14 DIAGNOSIS — I10 Essential (primary) hypertension: Secondary | ICD-10-CM | POA: Diagnosis not present

## 2022-06-14 DIAGNOSIS — I272 Pulmonary hypertension, unspecified: Secondary | ICD-10-CM | POA: Diagnosis present

## 2022-06-14 DIAGNOSIS — I444 Left anterior fascicular block: Secondary | ICD-10-CM | POA: Diagnosis present

## 2022-06-14 DIAGNOSIS — R1032 Left lower quadrant pain: Secondary | ICD-10-CM | POA: Diagnosis not present

## 2022-06-14 DIAGNOSIS — Z823 Family history of stroke: Secondary | ICD-10-CM

## 2022-06-14 DIAGNOSIS — R112 Nausea with vomiting, unspecified: Secondary | ICD-10-CM | POA: Diagnosis not present

## 2022-06-14 DIAGNOSIS — Z87891 Personal history of nicotine dependence: Secondary | ICD-10-CM

## 2022-06-14 DIAGNOSIS — G4733 Obstructive sleep apnea (adult) (pediatric): Secondary | ICD-10-CM | POA: Diagnosis present

## 2022-06-14 DIAGNOSIS — K922 Gastrointestinal hemorrhage, unspecified: Secondary | ICD-10-CM | POA: Diagnosis present

## 2022-06-14 DIAGNOSIS — M898X9 Other specified disorders of bone, unspecified site: Secondary | ICD-10-CM | POA: Diagnosis present

## 2022-06-14 DIAGNOSIS — I5042 Chronic combined systolic (congestive) and diastolic (congestive) heart failure: Secondary | ICD-10-CM | POA: Diagnosis present

## 2022-06-14 DIAGNOSIS — R0789 Other chest pain: Secondary | ICD-10-CM | POA: Diagnosis not present

## 2022-06-14 DIAGNOSIS — E1122 Type 2 diabetes mellitus with diabetic chronic kidney disease: Secondary | ICD-10-CM | POA: Diagnosis not present

## 2022-06-14 DIAGNOSIS — N186 End stage renal disease: Secondary | ICD-10-CM | POA: Diagnosis present

## 2022-06-14 DIAGNOSIS — Z833 Family history of diabetes mellitus: Secondary | ICD-10-CM

## 2022-06-14 DIAGNOSIS — M109 Gout, unspecified: Secondary | ICD-10-CM | POA: Diagnosis present

## 2022-06-14 DIAGNOSIS — R5381 Other malaise: Secondary | ICD-10-CM | POA: Diagnosis present

## 2022-06-14 DIAGNOSIS — I4891 Unspecified atrial fibrillation: Secondary | ICD-10-CM | POA: Diagnosis not present

## 2022-06-14 DIAGNOSIS — I440 Atrioventricular block, first degree: Secondary | ICD-10-CM | POA: Diagnosis present

## 2022-06-14 DIAGNOSIS — I6203 Nontraumatic chronic subdural hemorrhage: Secondary | ICD-10-CM | POA: Diagnosis present

## 2022-06-14 DIAGNOSIS — R079 Chest pain, unspecified: Secondary | ICD-10-CM | POA: Diagnosis not present

## 2022-06-14 DIAGNOSIS — G8929 Other chronic pain: Secondary | ICD-10-CM | POA: Diagnosis present

## 2022-06-14 DIAGNOSIS — Z5329 Procedure and treatment not carried out because of patient's decision for other reasons: Secondary | ICD-10-CM | POA: Diagnosis present

## 2022-06-14 DIAGNOSIS — E785 Hyperlipidemia, unspecified: Secondary | ICD-10-CM | POA: Diagnosis present

## 2022-06-14 DIAGNOSIS — R9431 Abnormal electrocardiogram [ECG] [EKG]: Secondary | ICD-10-CM | POA: Diagnosis present

## 2022-06-14 DIAGNOSIS — D631 Anemia in chronic kidney disease: Secondary | ICD-10-CM | POA: Diagnosis present

## 2022-06-14 DIAGNOSIS — K5730 Diverticulosis of large intestine without perforation or abscess without bleeding: Secondary | ICD-10-CM | POA: Diagnosis not present

## 2022-06-14 DIAGNOSIS — N200 Calculus of kidney: Secondary | ICD-10-CM | POA: Diagnosis not present

## 2022-06-14 DIAGNOSIS — K219 Gastro-esophageal reflux disease without esophagitis: Secondary | ICD-10-CM | POA: Diagnosis present

## 2022-06-14 DIAGNOSIS — I130 Hypertensive heart and chronic kidney disease with heart failure and stage 1 through stage 4 chronic kidney disease, or unspecified chronic kidney disease: Secondary | ICD-10-CM | POA: Diagnosis not present

## 2022-06-14 DIAGNOSIS — I5023 Acute on chronic systolic (congestive) heart failure: Secondary | ICD-10-CM | POA: Diagnosis not present

## 2022-06-14 DIAGNOSIS — N250 Renal osteodystrophy: Secondary | ICD-10-CM | POA: Diagnosis not present

## 2022-06-14 DIAGNOSIS — Z8249 Family history of ischemic heart disease and other diseases of the circulatory system: Secondary | ICD-10-CM

## 2022-06-14 DIAGNOSIS — I1311 Hypertensive heart and chronic kidney disease without heart failure, with stage 5 chronic kidney disease, or end stage renal disease: Secondary | ICD-10-CM | POA: Diagnosis not present

## 2022-06-14 DIAGNOSIS — I480 Paroxysmal atrial fibrillation: Secondary | ICD-10-CM | POA: Diagnosis present

## 2022-06-14 DIAGNOSIS — I208 Other forms of angina pectoris: Secondary | ICD-10-CM | POA: Diagnosis not present

## 2022-06-14 DIAGNOSIS — I959 Hypotension, unspecified: Secondary | ICD-10-CM | POA: Diagnosis not present

## 2022-06-14 LAB — CBC WITH DIFFERENTIAL/PLATELET
Abs Immature Granulocytes: 0.02 10*3/uL (ref 0.00–0.07)
Basophils Absolute: 0.1 10*3/uL (ref 0.0–0.1)
Basophils Relative: 1 %
Eosinophils Absolute: 0.2 10*3/uL (ref 0.0–0.5)
Eosinophils Relative: 3 %
HCT: 28 % — ABNORMAL LOW (ref 39.0–52.0)
Hemoglobin: 8.8 g/dL — ABNORMAL LOW (ref 13.0–17.0)
Immature Granulocytes: 0 %
Lymphocytes Relative: 20 %
Lymphs Abs: 1.5 10*3/uL (ref 0.7–4.0)
MCH: 33 pg (ref 26.0–34.0)
MCHC: 31.4 g/dL (ref 30.0–36.0)
MCV: 104.9 fL — ABNORMAL HIGH (ref 80.0–100.0)
Monocytes Absolute: 0.6 10*3/uL (ref 0.1–1.0)
Monocytes Relative: 9 %
Neutro Abs: 4.9 10*3/uL (ref 1.7–7.7)
Neutrophils Relative %: 67 %
Platelets: 213 10*3/uL (ref 150–400)
RBC: 2.67 MIL/uL — ABNORMAL LOW (ref 4.22–5.81)
RDW: 17.3 % — ABNORMAL HIGH (ref 11.5–15.5)
WBC: 7.3 10*3/uL (ref 4.0–10.5)
nRBC: 0.8 % — ABNORMAL HIGH (ref 0.0–0.2)

## 2022-06-14 LAB — COMPREHENSIVE METABOLIC PANEL
ALT: 10 U/L (ref 0–44)
AST: 21 U/L (ref 15–41)
Albumin: 2.9 g/dL — ABNORMAL LOW (ref 3.5–5.0)
Alkaline Phosphatase: 115 U/L (ref 38–126)
Anion gap: 12 (ref 5–15)
BUN: 19 mg/dL (ref 8–23)
CO2: 32 mmol/L (ref 22–32)
Calcium: 9.2 mg/dL (ref 8.9–10.3)
Chloride: 95 mmol/L — ABNORMAL LOW (ref 98–111)
Creatinine, Ser: 5.63 mg/dL — ABNORMAL HIGH (ref 0.61–1.24)
GFR, Estimated: 10 mL/min — ABNORMAL LOW (ref >60)
Glucose, Bld: 109 mg/dL — ABNORMAL HIGH (ref 70–99)
Potassium: 4.1 mmol/L (ref 3.5–5.1)
Sodium: 139 mmol/L (ref 135–145)
Total Bilirubin: 0.2 mg/dL — ABNORMAL LOW (ref 0.3–1.2)
Total Protein: 5.9 g/dL — ABNORMAL LOW (ref 6.5–8.1)

## 2022-06-14 LAB — GLUCOSE, CAPILLARY: Glucose-Capillary: 135 mg/dL — ABNORMAL HIGH (ref 70–99)

## 2022-06-14 LAB — TROPONIN I (HIGH SENSITIVITY)
Troponin I (High Sensitivity): 136 ng/L (ref <18)
Troponin I (High Sensitivity): 303 ng/L (ref <18)
Troponin I (High Sensitivity): 840 ng/L (ref <18)
Troponin I (High Sensitivity): 927 ng/L (ref <18)

## 2022-06-14 LAB — LIPASE, BLOOD: Lipase: 39 U/L (ref 11–51)

## 2022-06-14 LAB — CREATININE, SERUM
Creatinine, Ser: 6.2 mg/dL — ABNORMAL HIGH (ref 0.61–1.24)
GFR, Estimated: 9 mL/min — ABNORMAL LOW (ref >60)

## 2022-06-14 LAB — CBG MONITORING, ED: Glucose-Capillary: 98 mg/dL (ref 70–99)

## 2022-06-14 MED ORDER — SEVELAMER CARBONATE 800 MG PO TABS: 1600.0000 mg | ORAL_TABLET | ORAL | Status: DC

## 2022-06-14 MED ORDER — ROSUVASTATIN CALCIUM 5 MG PO TABS
10.0000 mg | ORAL_TABLET | Freq: Every day | ORAL | Status: DC
  Administered 2022-06-15 – 2022-06-17 (×3): 10 mg via ORAL
  Filled 2022-06-14 (×3): qty 2

## 2022-06-14 MED ORDER — ASPIRIN 325 MG PO TABS
325.0000 mg | ORAL_TABLET | Freq: Once | ORAL | Status: AC
  Administered 2022-06-14: 325 mg via ORAL
  Filled 2022-06-14: qty 1

## 2022-06-14 MED ORDER — HEPARIN SODIUM (PORCINE) 5000 UNIT/ML IJ SOLN
5000.0000 [IU] | Freq: Three times a day (TID) | INTRAMUSCULAR | Status: DC
  Administered 2022-06-14 – 2022-06-17 (×6): 5000 [IU] via SUBCUTANEOUS
  Filled 2022-06-14 (×7): qty 1

## 2022-06-14 MED ORDER — ALBUTEROL SULFATE (2.5 MG/3ML) 0.083% IN NEBU: 2.5000 mg | INHALATION_SOLUTION | Freq: Every day | RESPIRATORY_TRACT | Status: DC | PRN

## 2022-06-14 MED ORDER — TERIPARATIDE 600 MCG/2.4ML ~~LOC~~ SOPN: 20.0000 ug | PEN_INJECTOR | Freq: Every day | SUBCUTANEOUS | Status: DC

## 2022-06-14 MED ORDER — INSULIN ASPART 100 UNIT/ML IJ SOLN
0.0000 [IU] | Freq: Three times a day (TID) | INTRAMUSCULAR | Status: DC
  Administered 2022-06-15: 1 [IU] via SUBCUTANEOUS

## 2022-06-14 MED ORDER — QUETIAPINE FUMARATE 25 MG PO TABS
25.0000 mg | ORAL_TABLET | Freq: Every day | ORAL | Status: DC
  Administered 2022-06-14 – 2022-06-16 (×3): 25 mg via ORAL
  Filled 2022-06-14 (×3): qty 1

## 2022-06-14 MED ORDER — ALLOPURINOL 100 MG PO TABS
100.0000 mg | ORAL_TABLET | Freq: Every day | ORAL | Status: DC
  Administered 2022-06-15 – 2022-06-17 (×3): 100 mg via ORAL
  Filled 2022-06-14 (×3): qty 1

## 2022-06-14 MED ORDER — SEVELAMER CARBONATE 800 MG PO TABS
2400.0000 mg | ORAL_TABLET | Freq: Three times a day (TID) | ORAL | Status: DC
  Administered 2022-06-15 – 2022-06-17 (×4): 2400 mg via ORAL
  Filled 2022-06-14 (×4): qty 3

## 2022-06-14 MED ORDER — SEVELAMER CARBONATE 800 MG PO TABS: 1600.0000 mg | ORAL_TABLET | ORAL | Status: DC | PRN

## 2022-06-14 MED ORDER — LEVOTHYROXINE SODIUM 50 MCG PO TABS
50.0000 ug | ORAL_TABLET | Freq: Every day | ORAL | Status: DC
  Administered 2022-06-17: 50 ug via ORAL
  Filled 2022-06-14 (×2): qty 1

## 2022-06-14 MED ORDER — MIDODRINE HCL 5 MG PO TABS
10.0000 mg | ORAL_TABLET | ORAL | Status: DC
  Administered 2022-06-15: 10 mg via ORAL
  Filled 2022-06-14: qty 2

## 2022-06-14 MED ORDER — REPAGLINIDE 1 MG PO TABS
0.5000 mg | ORAL_TABLET | ORAL | Status: DC
  Administered 2022-06-15: 0.5 mg via ORAL
  Filled 2022-06-14: qty 0.5

## 2022-06-14 MED ORDER — ONDANSETRON HCL 4 MG/2ML IJ SOLN
4.0000 mg | Freq: Once | INTRAMUSCULAR | Status: AC
  Administered 2022-06-14: 4 mg via INTRAVENOUS
  Filled 2022-06-14: qty 2

## 2022-06-14 MED ORDER — INSULIN ASPART 100 UNIT/ML IJ SOLN: 0.0000 [IU] | Freq: Every day | INTRAMUSCULAR | Status: DC

## 2022-06-14 MED ORDER — OXYCODONE HCL 5 MG PO TABS
5.0000 mg | ORAL_TABLET | Freq: Once | ORAL | Status: AC
  Administered 2022-06-14: 5 mg via ORAL
  Filled 2022-06-14: qty 1

## 2022-06-14 MED ORDER — ACETAMINOPHEN 325 MG PO TABS
650.0000 mg | ORAL_TABLET | Freq: Once | ORAL | Status: AC
  Administered 2022-06-14: 650 mg via ORAL
  Filled 2022-06-14: qty 2

## 2022-06-14 MED ORDER — PANTOPRAZOLE SODIUM 40 MG PO TBEC
40.0000 mg | DELAYED_RELEASE_TABLET | Freq: Every day | ORAL | Status: DC
  Administered 2022-06-15 – 2022-06-17 (×3): 40 mg via ORAL
  Filled 2022-06-14 (×3): qty 1

## 2022-06-14 NOTE — ED Provider Notes (Signed)
This is a chronically ill 80 year old male presenting with a chief complaint of multiple conditions.  He has a history of dementia, A-fib, AVMs and lower GI bleed secondary to anticoagulation no longer on anticoagulation.  He has chronic abdominal pain.  They present to the emergency department today because of an episode of chest pain that occurred while patient was working with occupational therapist.  It was his left chest rating into his left epigastrium and into his left lower abdomen.  He has daily abdominal pain has been evaluated in the outpatient setting with recent referral to gastroenterology that unfortunately could not be completed because patient was tachycardic on arrival to their outpatient visit.  He was transferred to the emergency department, admitted with an elevated troponin, ultimately never saw gastroenterology while admitted.  Was discharged with a continued troponin elevation as it was starting to downtrend.  Objective work-up today concerning for ongoing troponin leak and concern for demand ischemia. Discussed with cardiology who recommended no anticoagulation given his history.  Discussed with hospitalist accepted and admission.  Tried to convey previous notes that patient has been unable to be evaluated by gastroenterology in the outpatient setting for his chronic abdominal pain.  Uncertain if hospitalization will be able to coordinate ongoing abdominal evaluation.  Acute work-up grossly reassuring at this time.  Patient admitted with no further acute events.   Tretha Sciara, MD 06/15/22 [phone]

## 2022-06-14 NOTE — ED Notes (Signed)
Pt resting in bed eating dinner. Pt states he is comfortable at this time

## 2022-06-14 NOTE — ED Notes (Signed)
Report given and care endorsed to Robin Searing RN

## 2022-06-14 NOTE — Progress Notes (Signed)
Pt troponins has trended up: 303, 840, & 927. Pt denies chest pain and is SB at 84s. He is having frequent periods of apnea with no dx of OSA. Sat remain >95%. Pt placed on 2L supplemental O2 for sleeping for apneic episodes. Cardiology paged and no new orders at this time.

## 2022-06-14 NOTE — ED Triage Notes (Signed)
Pt BIB [facility] reported wife reported patient woke up at 3 am c/o stomach pain and not feeling good, had couple bleeds. EMS said at 10am he had acute onset chest pressure with sharp shooting pain radiate across the chest , down the left arm to back, which made him sick and vomit.

## 2022-06-14 NOTE — ED Provider Notes (Signed)
[facility] EMERGENCY DEPARTMENT Provider Note   CSN: [id number] Arrival date & time: 06/14/22  1225     History  Chief Complaint  Patient presents with   Chest Pain    [name] is a 81 y.o. male.  HPI 81 year old male presents with chest pain.  History is primarily from the wife due to his history of dementia.  Patient tells me he is having active left-sided chest pain and abdominal pain.  Wife states that around 4 AM he cried out but she did not hear anything else.  At 9 AM he was "sick" and was complaining of abdominal pain.  Had a BM that was darker than usual but no blood.  Occupational Therapy came and then he started having chest pain and he vomited small amounts.  Heart rate was fluctuating between 95 and 105.  He was getting sharp left-sided chest pain going to his back and left arm.  EMS reported he was in A-fib.  He last had dialysis yesterday.  He occasionally urinates but it is minimal.  Wife reports no blood in the stools.  He has been having abdominal pain similar but not as severe as this for the last 2 months or more.   Home Medications Prior to Admission medications   Medication Sig Start Date End Date Taking? Authorizing Provider  acetaminophen (TYLENOL) 325 MG tablet Take 1-2 tablets (325-650 mg total) by mouth every 4 (four) hours as needed for mild pain. Patient taking differently: Take 650 mg by mouth in the morning and at bedtime. 08/01/18  Yes Love, Ivan Anchors, PA-C  albuterol (VENTOLIN HFA) 108 (90 Base) MCG/ACT inhaler Inhale 1-2 puffs into the lungs daily as needed for wheezing or shortness of breath.    [provider]  allopurinol (ZYLOPRIM) 100 MG tablet Take 100 mg by mouth daily.    [provider]  amiodarone (PACERONE) 200 MG tablet Take 1 tablet (200 mg total) by mouth 2 (two) times daily for 12 days, THEN 1 tablet (200 mg total) daily. 06/10/22 06/22/23  Eugenie Filler, MD  Blood Glucose Monitoring Suppl  (FREESTYLE FREEDOM LITE) w/Device KIT Use to check blood sugar 2 times per day dx code E11.65 10/21/18   Elayne Snare, MD  camphor-menthol [facility]) lotion Apply 1 application topically 4 (four) times daily as needed for itching.     [provider]  Carboxymethylcellulose Sodium (THERATEARS) 0.25 % SOLN Place 1 drop into both eyes 3 (three) times daily as needed (for dryness).    [provider]  cetirizine (ZYRTEC) 10 MG tablet Take 10 mg by mouth daily as needed for allergies.    [provider]  cyanocobalamin (,VITAMIN B-12,) 1000 MCG/ML injection Inject 1 mL (1,000 mcg total) into the muscle every 30 (thirty) days. 04/13/22   Elby Showers, MD  donepezil (ARICEPT) 10 MG tablet Take 1 tablet (10 mg total) by mouth at bedtime. 09/26/21   Melvenia Beam, MD  fluticasone (FLONASE) 50 MCG/ACT nasal spray Place 1 spray into both nostrils daily. Patient taking differently: Place 2 sprays into both nostrils daily as needed for allergies. 10/08/14   Mikhail, Maryann, DO  FORTEO 600 MCG/2.4ML SOPN INJECT 20 MCG UNDER THE SKIN DAILY (DISCARD 28 DAYS AFTER INITIAL USE) Patient taking differently: Inject 20 mcg into the skin at bedtime. (DISCARD 28 DAYS AFTER INITIAL USE) 05/14/22   Elayne Snare, MD  glucose blood (FREESTYLE LITE) test strip USE AS DIRECTED THREE TIMES DAILY  02/16/22   Elayne Snare, MD  hydrocortisone 2.5 % cream Apply 1 application topically 3 (three) times daily as needed (skin irritation (legs & arms)).  01/13/19   [provider]  Lancets (FREESTYLE) lancets Use as instructed to check blood sugar 2 times per day dx code E11.65 02/22/16   Elayne Snare, MD  levothyroxine (SYNTHROID, LEVOTHROID) 50 MCG tablet Take 1 tablet (50 mcg total) by mouth daily. Patient taking differently: Take 50 mcg by mouth daily before breakfast. 08/06/14   Elby Showers, MD  memantine (NAMENDA) 10 MG tablet Take 1 tablet (10 mg total) by mouth 2 (two) times daily. 09/26/21   Melvenia Beam, MD  midodrine (PROAMATINE) 10 MG tablet Take one pill prior to hemodialysis on MWF Patient taking differently: Take 10 mg by mouth See admin instructions. Take 10 mg by mouth prior to hemodialysis on Mon/Wed/Fri 08/13/18   Love, Ivan Anchors, PA-C  Nutritional Supplements (FEEDING SUPPLEMENT, NEPRO CARB STEADY,) LIQD Take 237 mLs by mouth in the morning.    [provider]  omeprazole (PRILOSEC) 40 MG capsule Take 1 capsule (40 mg total) by mouth daily. Patient taking differently: Take 40 mg by mouth daily before breakfast. 03/13/18   Esterwood, Amy S, PA-C  QUEtiapine (SEROQUEL) 25 MG tablet Take 1 tablet (25 mg total) by mouth at bedtime. 05/22/22   Elby Showers, MD  repaglinide (PRANDIN) 0.5 MG tablet TAKE 1 TABLET(0.5 MG) BY MOUTH TWICE DAILY BEFORE A MEAL Patient taking differently: Take 0.5 mg by mouth See admin instructions. Take 0.5 mg by mouth at 5 pm (AFTER dialysis) only on Mon/Wed/Fri 05/29/22   Elayne Snare, MD  rosuvastatin (CRESTOR) 10 MG tablet Take 1 tablet (10 mg total) by mouth daily. 06/11/22   Eugenie Filler, MD  sevelamer carbonate (RENVELA) 800 MG tablet Take 1,600-2,400 mg by mouth See admin instructions. Take 2,400 mg by mouth three times a day with meals and 1,600 mg with snacks    [provider]  Syringe/Needle, Disp, (SYRINGE 3CC/25GX1") 25G X 1" 3 ML MISC 1 each by Does not apply route every 30 (thirty) days. 04/13/22   Elby Showers, MD  TRADJENTA 5 MG TABS tablet Take 1 tablet (5 mg total) by mouth daily. 11/16/21   Elayne Snare, MD      Allergies    Ambien [zolpidem tartrate] and Penicillins    Review of Systems   Review of Systems  Unable to perform ROS: Dementia    Physical Exam Updated Vital Signs BP (!) 90/53 (BP Location: Left Arm) Comment (BP Location): lower arm  Pulse 61   Temp 97.9 F (36.6 C) (Oral)   Resp 12   Ht 5' 11"  (1.803 m)   SpO2 95%   BMI 21.59 kg/m  Physical Exam Vitals and nursing note reviewed.   Constitutional:      General: He is not in acute distress.    Appearance: He is well-developed. He is not ill-appearing or diaphoretic.  HENT:     Head: Normocephalic and atraumatic.  Cardiovascular:     Rate and Rhythm: Normal rate and regular rhythm.     Heart sounds: Normal heart sounds.  Pulmonary:     Effort: Pulmonary effort is normal.     Breath sounds: Normal breath sounds.  Chest:     Chest wall: Tenderness (left anterior chest) present.  Abdominal:     Palpations: Abdomen is soft.     Tenderness: There is abdominal tenderness in the left  upper quadrant and left lower quadrant.  Skin:    General: Skin is warm and dry.  Neurological:     Mental Status: He is alert.     ED Results / Procedures / Treatments   Labs (all labs ordered are listed, but only abnormal results are displayed) Labs Reviewed  COMPREHENSIVE METABOLIC PANEL  LIPASE, BLOOD  CBC WITH DIFFERENTIAL/PLATELET  TROPONIN I (HIGH SENSITIVITY)    EKG EKG Interpretation  Date/Time:  Thursday June 14 2022 12:26:29 EDT Ventricular Rate:  64 PR Interval:  246 QRS Duration: 112 QT Interval:  604 QTC Calculation: 624 R Axis:   -51 Text Interpretation: Sinus rhythm Prolonged PR interval Left anterior fascicular block Anteroseptal infarct, age indeterminate Prolonged QT interval Confirmed by Sherwood Gambler [id number]) on 06/14/2022 12:27:32 PM  Radiology No results found.  Procedures Procedures    Medications Ordered in ED Medications  acetaminophen (TYLENOL) tablet 650 mg (has no administration in time range)    ED Course/ Medical Decision Making/ A&P Clinical Course as of 06/14/22 1504  Thu Jun 14, 2022  1500 Stable: CCX CP. Recent admission for similar. Has afib, not on AC. Was in afib with ems now resolved. Per spouse, multiple complaints. HD tomorrow. Full session yesterday. Complaint delta, reassess.  [CC]    Clinical Course User Index [CC] Tretha Sciara, MD                            Medical Decision Making Amount and/or Complexity of Data Reviewed Labs: ordered.    Details: Hemoglobin 8.8 similar to and slightly better than most recent values Radiology: ordered and independent interpretation performed.    Details: CXR - small pleural effusion  CT abd - no diverticulitis ECG/medicine tests: independent interpretation performed.    Details: No acute ischemia  Risk OTC drugs.   Patient's symptoms seem to have been ongoing for a while on and off. Unclear if his CP this morning was more afib related than anything else. Initial CXR and CT abd are unremarkable. Trop pending. Disposition will depend on rest of workup. Doubt PE or dissection at this point. Care to Dr. Oswald Hillock.        Final Clinical Impression(s) / ED Diagnoses Final diagnoses:  None    Rx / DC Orders ED Discharge Orders     None         Sherwood Gambler, MD 06/14/22 1504

## 2022-06-14 NOTE — ED Notes (Signed)
Patient returned from CT

## 2022-06-14 NOTE — H&P (Addendum)
History and Physical  [name] HCW:[id number] DOB: [birth year] DOA: 06/14/2022  Referring physician: Dr. Oswald Hillock, EDP PCP: Elby Showers, MD  Outpatient Specialists: GI Patient coming from: Home  Chief Complaint: Chest pain and abdominal pain   HPI: [name] is a 81 y.o. male with medical history significant for dementia, ESRD on HD MWF, coronary artery disease, history of AAA repair, subdural hematoma evacuation, type 2 diabetes, hyperlipidemia, chronic hypotension on midodrine, anemia of chronic disease, history of recurrent GI bleed, gastric and small bowel AVMs, GERD, gout, chronic abdominal pain, who presented to [facility] ED due to left-sided nonexertional chest pain, radiating to his left abdomen.  Recently admitted and discharged 4 days ago with a similar presentation.   Onset of chest pain earlier this morning, during his occupational therapist visit.  Associated with one episode of nausea and vomiting nonbloody nonbilious emesis.  EMS was activated.  The patient was noted to be in A-fib with RVR.  To note, the patient has had a recent referral to GI but unfortunately this has not been completed.    In the ED, work-up revealed elevated high-sensitivity troponin, peaked at 303.  Seen by cardiology, recommended conservative management, no heparin drip, likely demand ischemia in the setting of A-fib with RVR.  Spontaneously converted back to sinus rhythm.  TRH, hospitalist service, was asked to admit.  ED Course: Tmax 98.1.  BP 130/44, pulse 61, respiration rate 19, O2 saturation 95% on room air.  Lab studies remarkable for hemoglobin 8.8, at baseline, MCV 104, WBC 7.3, platelet 213.  Review of Systems: Review of systems as noted in the HPI. All other systems reviewed and are negative.   Past Medical History:  Diagnosis Date   Allergy    Alzheimer's disease ([place]) 01/19/2021   Anemia    Arthritis    Cataract    bil cateracts removed   Coronary artery disease    Dementia  arising in the senium and presenium ([place])    Diabetes mellitus    Type 2   Diverticulitis    ED (erectile dysfunction)    Elevated homocysteine    ESRD (end stage renal disease) on dialysis ([place]) 03/2015   M-W-F dialysis   GERD (gastroesophageal reflux disease)    pepto    Gout    Hiatal hernia    Hyperlipidemia    Hypertension    Hypothyroidism    PVD (peripheral vascular disease) ([place])    has plastic aorta   Renal insufficiency    Sleep apnea    does not wear c-pap   Past Surgical History:  Procedure Laterality Date   ANGIOPLASTY Right 01/26/2021   Procedure: ANGIOPLASTY RIGHT SUBCLAVIAN VEIN;  Surgeon: Serafina Mitchell, MD;  Location: [place];  Service: Vascular;  Laterality: Right;   aortobifemoral bypass     AV FISTULA PLACEMENT Left 12/01/2013   Procedure: ARTERIOVENOUS (AV) FISTULA CREATION- LEFT BRACHIOCEPHALIC;  Surgeon: Angelia Mould, MD;  Location: [place];  Service: Vascular;  Laterality: Left;   [place] Right 07/27/2014   Procedure: BASCILIC VEIN TRANSPOSITION;  Surgeon: Angelia Mould, MD;  Location: [place];  Service: Vascular;  Laterality: Right;   BRAIN SURGERY  07/22/2018   BREAST SURGERY     left - granulomatous mastitis   BURR HOLE Bilateral 07/22/2018   Procedure: BILATERAL BURR HOLES;  Surgeon: Kristeen Miss, MD;  Location: [place];  Service: Neurosurgery;  Laterality: Bilateral;   COLONOSCOPY     ENDOV AAA REPR W  MDLR BIF PROSTH ([place] HX)  1992   ENTEROSCOPY N/A 06/03/2018   Procedure: ENTEROSCOPY;  Surgeon: Lavena Bullion, DO;  Location: [place] ENDOSCOPY;  Service: Gastroenterology;  Laterality: N/A;   ENTEROSCOPY N/A 11/20/2018   Procedure: ENTEROSCOPY;  Surgeon: Rush Landmark Telford Nab., MD;  Location: [place];  Service: Gastroenterology;  Laterality: N/A;   ENTEROSCOPY N/A 12/03/2019   Procedure: ENTEROSCOPY;  Surgeon: Lavena Bullion, DO;  Location: [facility] ENDOSCOPY;  Service: Gastroenterology;  Laterality: N/A;  push  enteroscopy   ENTEROSCOPY N/A 07/04/2021   Procedure: ENTEROSCOPY;  Surgeon: Sharyn Creamer, MD;  Location: [facility] ENDOSCOPY;  Service: Gastroenterology;  Laterality: N/A;   ENTEROSCOPY N/A 11/05/2021   Procedure: ENTEROSCOPY;  Surgeon: Daryel November, MD;  Location: [facility] ENDOSCOPY;  Service: Gastroenterology;  Laterality: N/A;   EYE SURGERY Bilateral    cataracts   FISTULOGRAM Right 01/26/2021   Procedure: FISTULOGRAM RIGHT;  Surgeon: Serafina Mitchell, MD;  Location: [facility] OR;  Service: Vascular;  Laterality: Right;   HEMODIALYSIS INPATIENT  01/17/2018       HOT HEMOSTASIS N/A 06/03/2018   Procedure: HOT HEMOSTASIS (ARGON PLASMA COAGULATION/BICAP);  Surgeon: Lavena Bullion, DO;  Location: [facility] ENDOSCOPY;  Service: Gastroenterology;  Laterality: N/A;   HOT HEMOSTASIS N/A 11/20/2018   Procedure: HOT HEMOSTASIS (ARGON PLASMA COAGULATION/BICAP);  Surgeon: Irving Copas., MD;  Location: [place];  Service: Gastroenterology;  Laterality: N/A;   HOT HEMOSTASIS N/A 12/03/2019   Procedure: HOT HEMOSTASIS (ARGON PLASMA COAGULATION/BICAP);  Surgeon: Lavena Bullion, DO;  Location: [facility] ENDOSCOPY;  Service: Gastroenterology;  Laterality: N/A;   INTRAMEDULLARY (IM) NAIL INTERTROCHANTERIC Right 11/08/2020   Procedure: INTRAMEDULLARY (IM) NAIL INTERTROCHANTRIC;  Surgeon: Erle Crocker, MD;  Location: [place];  Service: Orthopedics;  Laterality: Right;   REVISION OF ARTERIOVENOUS GORETEX GRAFT Right 09/01/2019   Procedure: REVISION OF ARTERIOVENOUS FISTULA RIGHT ARM;  Surgeon: Serafina Mitchell, MD;  Location: [place];  Service: Vascular;  Laterality: Right;   REVISON OF ARTERIOVENOUS FISTULA Left 02/09/2014   Procedure: REVISON OF LEFT ARTERIOVENOUS FISTULA - RESECTION OF RENDUNDANT VEIN;  Surgeon: Angelia Mould, MD;  Location: [place];  Service: Vascular;  Laterality: Left;   REVISON OF ARTERIOVENOUS FISTULA Right 01/26/2021   Procedure: REVISON OF ARTERIOVENOUS FISTULA RIGHT;  Surgeon: Serafina Mitchell, MD;  Location: [place];  Service: Vascular;  Laterality: Right;   SBO with lysis adhesions     SHUNTOGRAM Left 04/19/2014   Procedure: FISTULOGRAM;  Surgeon: Angelia Mould, MD;  Location: [facility] CATH LAB;  Service: Cardiovascular;  Laterality: Left;   UNILATERAL UPPER EXTREMEITY ANGIOGRAM N/A 07/12/2014   Procedure: UNILATERAL UPPER Anselmo Rod;  Surgeon: Angelia Mould, MD;  Location: [facility] CATH LAB;  Service: Cardiovascular;  Laterality: N/A;    Social History:  reports that he quit smoking about 27 years ago. His smoking use included cigarettes. He has never been exposed to tobacco smoke. He has never used smokeless tobacco. He reports that he does not drink alcohol and does not use drugs.   Allergies  Allergen Reactions   Ambien [Zolpidem Tartrate] Other (See Comments)    Hallucinations and "felt crazy"    Penicillins Hives and Rash    Has patient had a PCN reaction causing immediate rash, facial/tongue/throat swelling, SOB or lightheadedness with hypotension: Yes Has patient had a PCN reaction causing severe rash involving mucus membranes or skin necrosis: Yes Has patient had a PCN reaction that required hospitalization: No Has patient had a PCN reaction occurring  within the last 10 years: No If all of the above answers are "NO", then may proceed with Cephalosporin use.     Family History  Problem Relation Age of Onset   Aneurysm Mother        brain   Heart disease Father    Stroke Father    Hypertension Father    Diabetes Father    Dementia Neg Hx    Colon cancer Neg Hx    Esophageal cancer Neg Hx    Pancreatic cancer Neg Hx    Prostate cancer Neg Hx    Rectal cancer Neg Hx    Stomach cancer Neg Hx       Prior to Admission medications   Medication Sig Start Date End Date Taking? Authorizing Provider  acetaminophen (TYLENOL) 325 MG tablet Take 1-2 tablets (325-650 mg total) by mouth every 4 (four) hours as needed for mild pain. Patient taking  differently: Take 650 mg by mouth in the morning and at bedtime. 08/01/18  Yes Love, Ivan Anchors, PA-C  albuterol (VENTOLIN HFA) 108 (90 Base) MCG/ACT inhaler Inhale 1-2 puffs into the lungs daily as needed for wheezing or shortness of breath.   Yes [provider]  allopurinol (ZYLOPRIM) 100 MG tablet Take 100 mg by mouth daily.   Yes [provider]  amiodarone (PACERONE) 200 MG tablet Take 1 tablet (200 mg total) by mouth 2 (two) times daily for 12 days, THEN 1 tablet (200 mg total) daily. 06/10/22 06/22/23 Yes Eugenie Filler, MD  camphor-menthol [facility]) lotion Apply 1 application topically 4 (four) times daily as needed for itching.    Yes [provider]  Carboxymethylcellulose Sodium (THERATEARS) 0.25 % SOLN Place 1 drop into both eyes 3 (three) times daily as needed (for dryness).   Yes [provider]  cetirizine (ZYRTEC) 10 MG tablet Take 10 mg by mouth daily as needed for allergies.   Yes [provider]  cyanocobalamin (,VITAMIN B-12,) 1000 MCG/ML injection Inject 1 mL (1,000 mcg total) into the muscle every 30 (thirty) days. 04/13/22  Yes Baxley, Cresenciano Lick, MD  donepezil (ARICEPT) 10 MG tablet Take 1 tablet (10 mg total) by mouth at bedtime. 09/26/21  Yes Melvenia Beam, MD  fluticasone (FLONASE) 50 MCG/ACT nasal spray Place 1 spray into both nostrils daily. Patient taking differently: Place 2 sprays into both nostrils daily as needed for allergies. 10/08/14  Yes Mikhail, Maryann, DO  FORTEO 600 MCG/2.4ML SOPN INJECT 20 MCG UNDER THE SKIN DAILY (DISCARD 28 DAYS AFTER INITIAL USE) Patient taking differently: Inject 20 mcg into the skin at bedtime. (DISCARD 28 DAYS AFTER INITIAL USE) 05/14/22  Yes Elayne Snare, MD  hydrocortisone 2.5 % cream Apply 1 application topically 3 (three) times daily as needed (skin irritation (legs & arms)).  01/13/19  Yes [provider]  levothyroxine (SYNTHROID, LEVOTHROID) 50 MCG tablet Take 1 tablet (50 mcg total) by  mouth daily. Patient taking differently: Take 50 mcg by mouth daily before breakfast. 08/06/14  Yes Baxley, Cresenciano Lick, MD  memantine (NAMENDA) 10 MG tablet Take 1 tablet (10 mg total) by mouth 2 (two) times daily. 09/26/21  Yes Melvenia Beam, MD  midodrine (PROAMATINE) 10 MG tablet Take one pill prior to hemodialysis on MWF Patient taking differently: Take 10 mg by mouth See admin instructions. Take 10 mg by mouth prior to hemodialysis on Mon/Wed/Fri 08/13/18  Yes Love, Ivan Anchors, PA-C  Nutritional Supplements (FEEDING SUPPLEMENT, NEPRO CARB STEADY,) LIQD Take 237 mLs by  mouth in the morning.   Yes [provider]  omeprazole (PRILOSEC) 40 MG capsule Take 1 capsule (40 mg total) by mouth daily. Patient taking differently: Take 40 mg by mouth daily before breakfast. 03/13/18  Yes Esterwood, Amy S, PA-C  QUEtiapine (SEROQUEL) 25 MG tablet Take 1 tablet (25 mg total) by mouth at bedtime. 05/22/22  Yes Baxley, Cresenciano Lick, MD  repaglinide (PRANDIN) 0.5 MG tablet TAKE 1 TABLET(0.5 MG) BY MOUTH TWICE DAILY BEFORE A MEAL Patient taking differently: Take 0.5 mg by mouth See admin instructions. Take 0.5 mg by mouth at 5 pm (AFTER dialysis) only on Mon/Wed/Fri 05/29/22  Yes Elayne Snare, MD  rosuvastatin (CRESTOR) 10 MG tablet Take 1 tablet (10 mg total) by mouth daily. 06/11/22  Yes Eugenie Filler, MD  sevelamer carbonate (RENVELA) 800 MG tablet Take 1,600-2,400 mg by mouth See admin instructions. Take 2,400 mg by mouth three times a day with meals and 1,600 mg with snacks   Yes [provider]  TRADJENTA 5 MG TABS tablet Take 1 tablet (5 mg total) by mouth daily. 11/16/21  Yes Elayne Snare, MD  Blood Glucose Monitoring Suppl (FREESTYLE FREEDOM LITE) w/Device KIT Use to check blood sugar 2 times per day dx code E11.65 10/21/18   Elayne Snare, MD  glucose blood (FREESTYLE LITE) test strip USE AS DIRECTED THREE TIMES DAILY 02/16/22   Elayne Snare, MD  Lancets (FREESTYLE) lancets Use as instructed to check  blood sugar 2 times per day dx code E11.65 02/22/16   Elayne Snare, MD  Syringe/Needle, Disp, (SYRINGE 3CC/25GX1") 25G X 1" 3 ML MISC 1 each by Does not apply route every 30 (thirty) days. 04/13/22   Elby Showers, MD    Physical Exam: BP (!) 123/39   Pulse 63   Temp 98.1 F (36.7 C) (Oral)   Resp (!) 21   Ht 5' 7" (1.702 m)   Wt 70.3 kg   SpO2 95%   BMI 24.28 kg/m   General: 81 y.o. year-old male well developed well nourished in no acute distress.  Alert and interactive. Cardiovascular: Regular rate and rhythm with no rubs or gallops.  No thyromegaly or JVD noted.  No lower extremity edema. 2/4 pulses in all 4 extremities. Respiratory: Clear to auscultation with no wheezes or rales. Good inspiratory effort. Abdomen: Soft nontender nondistended with normal bowel sounds x4 quadrants. Muskuloskeletal: No cyanosis, clubbing or edema noted bilaterally Neuro: CN II-XII intact, strength, sensation, reflexes Skin: No ulcerative lesions noted or rashes Psychiatry: Judgement and insight appear altered l. Mood is appropriate for condition and setting          Labs on Admission:  Basic Metabolic Panel: Recent Labs  Lab 06/08/22 0800 06/09/22 0210 06/10/22 0123 06/14/22 1350  NA 139 138 135 139  K 4.0 3.9 4.2 4.1  CL 97* 97* 94* 95*  CO2 22 33* 30 32  GLUCOSE 119* 93 112* 109*  BUN 36* 17 29* 19  CREATININE 8.93* 5.18* 7.23* 5.63*  CALCIUM 8.8* 9.1 8.8* 9.2  PHOS 5.6* 3.6 3.6  --    Liver Function Tests: Recent Labs  Lab 06/08/22 0800 06/09/22 0210 06/10/22 0123 06/14/22 1350  AST  --   --   --  21  ALT  --   --   --  10  ALKPHOS  --   --   --  115  BILITOT  --   --   --  0.2*  PROT  --   --   --  5.9*  ALBUMIN 2.7* 2.8* 2.7* 2.9*   Recent Labs  Lab 06/14/22 1350  LIPASE 39   No results for input(s): "AMMONIA" in the last 168 hours. CBC: Recent Labs  Lab 06/08/22 0800 06/09/22 0210 06/10/22 0123 06/14/22 1350  WBC 6.9 6.6 7.0 7.3  NEUTROABS  --   --   --   4.9  HGB 8.4* 8.2* 8.0* 8.8*  HCT 26.3* 25.3* 24.6* 28.0*  MCV 101.5* 100.0 100.8* 104.9*  PLT 199 190 181 213   Cardiac Enzymes: No results for input(s): "CKTOTAL", "CKMB", "CKMBINDEX", "TROPONINI" in the last 168 hours.  BNP (last 3 results) Recent Labs    06/06/22 2010  BNP 3,250.3*    ProBNP (last 3 results) No results for input(s): "PROBNP" in the last 8760 hours.  CBG: Recent Labs  Lab 06/09/22 1616 06/09/22 2106 06/10/22 0627 06/10/22 1116 06/14/22 1615  GLUCAP 124* 148* 93 128* 98    Radiological Exams on Admission: CT ABDOMEN PELVIS WO CONTRAST  Result Date: 06/14/2022 CLINICAL DATA:  Left lower quadrant abdominal pain EXAM: CT ABDOMEN AND PELVIS WITHOUT CONTRAST TECHNIQUE: Multidetector CT imaging of the abdomen and pelvis was performed following the standard protocol without IV contrast. RADIATION DOSE REDUCTION: This exam was performed according to the departmental dose-optimization program which includes automated exposure control, adjustment of the mA and/or kV according to patient size and/or use of iterative reconstruction technique. COMPARISON:  06/06/2022 FINDINGS: Lower chest: Trace left pleural effusion with mild dependent atelectasis. Heart size is normal. Aortic and coronary artery atherosclerosis. Right-sided gynecomastia. Hepatobiliary: Stable subcentimeter low-density lesions within the liver, too small to characterize. Liver otherwise within normal limits. Unremarkable gallbladder. No hyperdense gallstone. No biliary dilatation. Pancreas: Unremarkable. No pancreatic ductal dilatation or surrounding inflammatory changes. Spleen: Normal in size without focal abnormality. Adrenals/Urinary Tract: Unremarkable adrenal glands. Bilateral renal atrophy. Numerous bilateral renal cysts, stable, and no dedicated imaging follow-up is recommended. No renal stone or hydronephrosis. Urinary bladder is decompressed. Stomach/Bowel: Stomach is within normal limits. Scattered  colonic diverticulosis. No evidence of bowel wall thickening, distention, or inflammatory changes. Vascular/Lymphatic: Advanced atherosclerotic calcifications throughout the aortoiliac axis. Prior aorto bi-iliac stent graft, unable to be assessed on noncontrast imaging. No abdominopelvic lymphadenopathy. Reproductive: Prostate is unremarkable. Other: No free fluid. No abdominopelvic fluid collection. No pneumoperitoneum. Postsurgical changes to the anterior abdominal wall. Musculoskeletal: No interval change of lumbar vertebral body compression fractures involving L1, L2, L3, L4, and L5. Prior right hip ORIF. No new or acute bony findings. IMPRESSION: 1. No acute abdominopelvic findings. 2. Trace left pleural effusion with mild dependent atelectasis. 3. Colonic diverticulosis without evidence of acute diverticulitis. 4. Aortic and coronary artery atherosclerosis (ICD10-I70.0). Electronically Signed   By: Davina Poke D.O.   On: 06/14/2022 14:38   DG Chest 2 View  Result Date: 06/14/2022 CLINICAL DATA:  Chest pain. EXAM: CHEST - 2 VIEW COMPARISON:  CT chest dated June 06, 2022. Chest x-ray dated October 01, 2021. FINDINGS: The heart size and mediastinal contours are within normal limits. Normal pulmonary vascularity. Unchanged small left pleural effusion with adjacent left lower lobe opacity. The right lung is clear. No pneumothorax. No acute osseous abnormality. IMPRESSION: 1. Unchanged small left pleural effusion with adjacent left lower lobe atelectasis versus infiltrate. Electronically Signed   By: Titus Dubin M.D.   On: 06/14/2022 14:08    EKG: I independently viewed the EKG done and my findings are as followed:    Assessment/Plan Present on Admission:  Chest pain  Principal Problem:  Chest pain  Atypical chest pain, rule out ACS Full dose aspirin 325 mg x 1 Resume home Crestor Left chest pain radiating to left lower abdomen Presented with A-fib with RVR Troponin elevated greater  than 300, no evidence of acute ischemia on 12-lead EKG, suspected demand ischemia in the setting of A-fib with RVR. Cardiology following, appreciate assistance Recent 2D echo on 06/07/2022 which revealed LVEF 45 to 50% with left ventricle global hypokinesis. Monitor on telemetry  Anemia of chronic disease History of GI bleed/gastric small bowel AVMs GERD Hemoglobin appears to be at baseline 8.8 No overt bleeding reported Resume home PPI Monitor H&H, transfuse hemoglobin less than 8.0.  ESRD on HD MWF Last hemodialysis on 06/13/2022 Continue hemodialysis inpatient Consult nephrology in the morning to resume HD  Chronic hypotension Resume home midodrine regimen Maintain MAP greater than 65 Continue to monitor vital signs  Chronic left lower quadrant abdominal pain CT abdomen and pelvis without contrast was unrevealing. Consider GI consult during this admission  Dementia Resume home regimen Reorient as needed  Type 2 diabetes with hyperglycemia Last hemoglobin A1c 6.5 on 05/17/2022. Start very sensitive insulin sliding scale. Renal, modified diet  Hypothyroidism Resume home levothyroxine  Gout Resume home allopurinol  Physical debility Resume PT OT Fall precautions  Prolonged Qtc Admission twelve-lead EKG, QTc 575. Avoid QTc prolonging agents Electrolytes managed with hemodialysis    DVT prophylaxis: Subcu heparin 3 times daily  Code Status: DNR per the patient's wife via phone.  Family Communication: None at bedside.  Updated the patient's wife via phone.  Disposition Plan: Admit to telemetry cardiac unit  Consults called: Cardiology  Admission status: Inpatient status   Status is: Inpatient The patient requires at least 2 midnights for further evaluation and treatment of present condition.   Kayleen Memos MD [facility] Pager [phone]  If 7PM-7AM, please contact night-coverage [email or web address] Password [facility]  06/14/2022, 7:54 PM

## 2022-06-14 NOTE — Consult Note (Addendum)
Cardiology Consultation   Patient ID: [name] MRN: [id number]; DOB: 10/22/1940  Admit date: 06/14/2022 Date of Consult: 06/14/2022  PCP:  Elby Showers, MD   [place] [facility] Cardiologist:  Minus Breeding, MD   {   Patient Profile:   [name] is a 81 y.o. male with a hx of CAD, paroxysmal atrial tachycardia/paroxysmal atrial fibrillation, chronic subdural hematoma, chronic anemia secondary to recurrent GI bleed, end-stage renal disease on hemodialysis Monday Wednesday Friday, dementia, hypertension, hyperlipidemia, peripheral artery disease and obstructive sleep apnea  who is being seen 06/14/2022 for the evaluation of Elevated troponin at the request of Dr. Oswald Hillock.  Patient was seen in ER 9/13  with abdominal pain and found to be anemic. Discharge from ER with outpatient work-up.  While at GI clinic he had episode of tachycardia and sent to ER.  Reported chest discomfort but history unreliable given baseline dementia. High-sensitivity troponin 526--> 643--> 589.  EKG is without acute ischemic changes and he has no chest pain.  His symptoms are all abdominal.  Does not seem like this is a type I NSTEMI.  Furthermore, he is not a good candidate for cardiac catheterization given his underlying dementia, chronic anemia, and history of subdural hematomas as well as GI bleeding.  Recommend conservative management.  He not on anticoagulation due to high bleeding risk.  Not on any rate control agent as requiring midodrine prior to dialysis based on home medications. No GI strip was available for review.   History of Present Illness:   [name] work-up this morning with abdominal pain.  Later in the day he developed chest pressure and EMS was called.  He was in atrial fibrillation at rate of 100 upon EMS arrival.  He was brought to ER for further evaluation.  No blood in the stool.  During our evaluation his main complaint is lower abdominal pain.  Reported  intermittent chest discomfort.  Reported at his bottom.  Troponin 136>>303 Hgb 8.8 K 4.1  CT of abdomen without acute finding  Past Medical History:  Diagnosis Date   Allergy    Alzheimer's disease ([place]) 01/19/2021   Anemia    Arthritis    Cataract    bil cateracts removed   Coronary artery disease    Dementia arising in the senium and presenium ([place])    Diabetes mellitus    Type 2   Diverticulitis    ED (erectile dysfunction)    Elevated homocysteine    ESRD (end stage renal disease) on dialysis ([place]) 03/2015   M-W-F dialysis   GERD (gastroesophageal reflux disease)    pepto    Gout    Hiatal hernia    Hyperlipidemia    Hypertension    Hypothyroidism    PVD (peripheral vascular disease) ([place])    has plastic aorta   Renal insufficiency    Sleep apnea    does not wear c-pap    Past Surgical History:  Procedure Laterality Date   ANGIOPLASTY Right 01/26/2021   Procedure: ANGIOPLASTY RIGHT SUBCLAVIAN VEIN;  Surgeon: Serafina Mitchell, MD;  Location: [place];  Service: Vascular;  Laterality: Right;   aortobifemoral bypass     AV FISTULA PLACEMENT Left 12/01/2013   Procedure: ARTERIOVENOUS (AV) FISTULA CREATION- LEFT BRACHIOCEPHALIC;  Surgeon: Angelia Mould, MD;  Location: [place];  Service: Vascular;  Laterality: Left;   [place] Right 07/27/2014   Procedure: BASCILIC VEIN TRANSPOSITION;  Surgeon: Angelia Mould, MD;  [facility] OR;  Service: Vascular;  Laterality: Right;   BRAIN SURGERY  07/22/2018   BREAST SURGERY     left - granulomatous mastitis   BURR HOLE Bilateral 07/22/2018   Procedure: BILATERAL BURR HOLES;  Surgeon: Kristeen Miss, MD;  Location: [place];  Service: Neurosurgery;  Laterality: Bilateral;   COLONOSCOPY     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 ([place] [facility])  1992   ENTEROSCOPY N/A 06/03/2018   Procedure: ENTEROSCOPY;  Surgeon: Lavena Bullion, DO;  Location: [facility] ENDOSCOPY;  Service: Gastroenterology;  Laterality: N/A;    ENTEROSCOPY N/A 11/20/2018   Procedure: ENTEROSCOPY;  Surgeon: Rush Landmark Telford Nab., MD;  Location: [place];  Service: Gastroenterology;  Laterality: N/A;   ENTEROSCOPY N/A 12/03/2019   Procedure: ENTEROSCOPY;  Surgeon: Lavena Bullion, DO;  Location: [facility] ENDOSCOPY;  Service: Gastroenterology;  Laterality: N/A;  push enteroscopy   ENTEROSCOPY N/A 07/04/2021   Procedure: ENTEROSCOPY;  Surgeon: Sharyn Creamer, MD;  Location: [facility] ENDOSCOPY;  Service: Gastroenterology;  Laterality: N/A;   ENTEROSCOPY N/A 11/05/2021   Procedure: ENTEROSCOPY;  Surgeon: Daryel November, MD;  Location: [facility] ENDOSCOPY;  Service: Gastroenterology;  Laterality: N/A;   EYE SURGERY Bilateral    cataracts   FISTULOGRAM Right 01/26/2021   Procedure: FISTULOGRAM RIGHT;  Surgeon: Serafina Mitchell, MD;  [facility] OR;  Service: Vascular;  Laterality: Right;   HEMODIALYSIS INPATIENT  01/17/2018       HOT HEMOSTASIS N/A 06/03/2018   Procedure: HOT HEMOSTASIS (ARGON PLASMA COAGULATION/BICAP);  Surgeon: Lavena Bullion, DO;  Location: [facility] ENDOSCOPY;  Service: Gastroenterology;  Laterality: N/A;   HOT HEMOSTASIS N/A 11/20/2018   Procedure: HOT HEMOSTASIS (ARGON PLASMA COAGULATION/BICAP);  Surgeon: Irving Copas., MD;  Location: [place];  Service: Gastroenterology;  Laterality: N/A;   HOT HEMOSTASIS N/A 12/03/2019   Procedure: HOT HEMOSTASIS (ARGON PLASMA COAGULATION/BICAP);  Surgeon: Lavena Bullion, DO;  Location: [facility] ENDOS[facility];  Service: Gastroenterology;  Laterality: N/A;   INTRAMEDULLARY (IM) NAIL INTERTROCHANTERIC Right 11/08/2020   Procedure: INTRAMEDULLARY (IM) NAIL INTERTROCHANTRIC;  Surgeon: Erle Crocker, MD;  Location: [place];  Service: Orthopedics;  Laterality: Right;   REVISION OF ARTERIOVENOUS GORETEX GRAFT Right 09/01/2019   Procedure: REVISION OF ARTERIOVENOUS FISTULA RIGHT ARM;  Surgeon: Serafina Mitchell, MD;  Location: [place];  Service: Vascular;  Laterality: Right;   REVISON OF  ARTERIOVENOUS FISTULA Left 02/09/2014   Procedure: REVISON OF LEFT ARTERIOVENOUS FISTULA - RESECTION OF RENDUNDANT VEIN;  Surgeon: Angelia Mould, MD;  Location: [place];  Service: Vascular;  Laterality: Left;   REVISON OF ARTERIOVENOUS FISTULA Right 01/26/2021   Procedure: REVISON OF ARTERIOVENOUS FISTULA RIGHT;  Surgeon: Serafina Mitchell, MD;  Location: [place];  Service: Vascular;  Laterality: Right;   SBO with lysis adhesions     SHUNTOGRAM Left 04/19/2014   Procedure: FISTULOGRAM;  Surgeon: Angelia Mould, MD;  Location: [facility] CATH LAB;  Service: Cardiovascular;  Laterality: Left;   UNILATERAL UPPER EXTREMEITY ANGIOGRAM N/A 07/12/2014   Procedure: UNILATERAL UPPER Anselmo Rod;  Surgeon: Angelia Mould, MD;  Location: [facility] CATH LAB;  Service: Cardiovascular;  Laterality: N/A;     Inpatient Medications: Scheduled Meds:  Continuous Infusions:  PRN Meds:   Allergies:    Allergies  Allergen Reactions   Ambien [Zolpidem Tartrate] Other (See Comments)    Hallucinations and "felt crazy"    Penicillins Hives and Rash    Has patient had a PCN reaction causing immediate rash, facial/tongue/throat swelling, SOB or lightheadedness with  hypotension: Yes Has patient had a PCN reaction causing severe rash involving mucus membranes or skin necrosis: Yes Has patient had a PCN reaction that required hospitalization: No Has patient had a PCN reaction occurring within the last 10 years: No If all of the above answers are "NO", then may proceed with Cephalosporin use.     Social History:   Social History   Socioeconomic History   Marital status: Married    Spouse name: [name] Mood   Number of children: 1   Years of education: 16   Highest education level: Not on file  Occupational History   Occupation: Retired  Tobacco Use   Smoking status: Former    Types: Cigarettes    Quit date: 09/24/1994    Years since quitting: 27.7    Passive exposure: Never   Smokeless tobacco:  Never  Vaping Use   Vaping Use: Never used  Substance and Sexual Activity   Alcohol use: No    Alcohol/week: 0.0 standard drinks of alcohol    Comment: Quit Oct. 1977 ("somewhat heavy")   Drug use: No   Sexual activity: Not on file  Other Topics Concern   Not on file  Social History Narrative   Lives at home with wife.   Caffeine use: Drinks no tea, "a little soda"   Drinks 1 cup coffee/week   Right handed   VETERAN   Social Determinants of Health   Financial Resource Strain: Not on file  Food Insecurity: No Food Insecurity (04/25/2022)   Hunger Vital Sign    Worried About Running Out of Food in the Last Year: Never true    Ran Out of Food in the Last Year: Never true  Transportation Needs: No Transportation Needs (04/25/2022)   PRAPARE - Hydrologist (Medical): No    Lack of Transportation (Non-Medical): No  Physical Activity: Insufficiently Active (07/30/2017)   Exercise Vital Sign    Days of Exercise per Week: 3 days    Minutes of Exercise per Session: 10 min  Stress: No Stress Concern Present (07/30/2017)   [place]    Feeling of Stress : Not at all  Social Connections: Not on file  Intimate Partner Violence: Not on file    Family History:   Family History  Problem Relation Age of Onset   Aneurysm Mother        brain   Heart disease Father    Stroke Father    Hypertension Father    Diabetes Father    Dementia Neg Hx    Colon cancer Neg Hx    Esophageal cancer Neg Hx    Pancreatic cancer Neg Hx    Prostate cancer Neg Hx    Rectal cancer Neg Hx    Stomach cancer Neg Hx      ROS:  Please see the history of present illness.   All other ROS reviewed and negative.     Physical Exam/Data:   Vitals:   06/14/22 1515 06/14/22 1530 06/14/22 1615 06/14/22 1735  BP: (!) 128/44  (!) 136/56 (!) 141/48  Pulse: 63  (!) 59 63  Resp: '18  15 18  '$ Temp:    98.1 F (36.7 C)   TempSrc:    Oral  SpO2: 90%  92% 94%  Weight:  70.3 kg    Height:  '5\' 7"'$  (1.702 m)     No intake or output data in the 24 hours ending 06/14/22  1932    06/14/2022    3:30 PM 06/08/2022   12:11 PM 06/08/2022    7:40 AM  Last 3 Weights  Weight (lbs) 155 lb 154 lb 12.2 oz 157 lb 13.6 oz  Weight (kg) 70.308 kg 70.2 kg 71.6 kg     Body mass index is 24.28 kg/m.  General:  Well nourished, well developed, in no acute distress HEENT: normal Neck: no JVD Vascular: No carotid bruits; Distal pulses 2+ bilaterally Cardiac:  normal S1, S2; RRR; no murmur  Lungs:  clear to auscultation bilaterally, no wheezing, rhonchi or rales  Abd: soft, nontender, no hepatomegaly  Ext: no edema Musculoskeletal:  No deformities, BUE and BLE strength normal and equal Skin: warm and dry  Neuro:  no focal abnormalities noted Psych:  mild dementia   EKG:  The EKG was personally reviewed and demonstrates:  SR, lateral TWI Telemetry:  Telemetry was personally reviewed and demonstrates:  sinus rhythm  Relevant CV Studies:  Echo 06/07/22 1. LV apical false tendon (normal variant). Left ventricular ejection  fraction, by estimation, is 45 to 50%. The left ventricle has mildly  decreased function. The left ventricle demonstrates global hypokinesis.  There is mild left ventricular  hypertrophy. Left ventricular diastolic parameters are consistent with  Grade I diastolic dysfunction (impaired relaxation). Elevated left  ventricular end-diastolic pressure. The E/e' is 91.   2. Right ventricular systolic function is normal. The right ventricular  size is normal. There is mildly elevated pulmonary artery systolic  pressure. The estimated right ventricular systolic pressure is 50.3 mmHg.   3. The mitral valve is abnormal. Mild to moderate mitral valve  regurgitation.   4. The tricuspid valve is abnormal. Tricuspid valve regurgitation is mild  to moderate.   5. The aortic valve is tricuspid. Aortic valve  regurgitation is not  visualized.   6. The inferior vena cava is dilated in size with <50% respiratory  variability, suggesting right atrial pressure of 15 mmHg.   Comparison(s): Changes from prior study are noted. 11/07/2020: LVEF 55-60%,  RVSP 40 mmHg, trivial MR.   Laboratory Data:  High Sensitivity Troponin:   Recent Labs  Lab 06/06/22 1703 06/06/22 1814 06/06/22 2315 06/14/22 1350 06/14/22 1440  TROPONINIHS 526* 643* 589* 136* 303*     Chemistry Recent Labs  Lab 06/09/22 0210 06/10/22 0123 06/14/22 1350  NA 138 135 139  K 3.9 4.2 4.1  CL 97* 94* 95*  CO2 33* 30 32  GLUCOSE 93 112* 109*  BUN 17 29* 19  CREATININE 5.18* 7.23* 5.63*  CALCIUM 9.1 8.8* 9.2  GFRNONAA 11* 7* 10*  ANIONGAP '8 11 12    '$ Recent Labs  Lab 06/09/22 0210 06/10/22 0123 06/14/22 1350  PROT  --   --  5.9*  ALBUMIN 2.8* 2.7* 2.9*  AST  --   --  21  ALT  --   --  10  ALKPHOS  --   --  115  BILITOT  --   --  0.2*   Lipids No results for input(s): "CHOL", "TRIG", "HDL", "LABVLDL", "LDLCALC", "CHOLHDL" in the last 168 hours.  Hematology Recent Labs  Lab 06/09/22 0210 06/10/22 0123 06/14/22 1350  WBC 6.6 7.0 7.3  RBC 2.53* 2.44* 2.67*  HGB 8.2* 8.0* 8.8*  HCT 25.3* 24.6* 28.0*  MCV 100.0 100.8* 104.9*  MCH 32.4 32.8 33.0  MCHC 32.4 32.5 31.4  RDW 17.2* 16.6* 17.3*  PLT 190 181 213   Thyroid  Recent Labs  Lab 06/08/22 1100  06/08/22 1236  TSH 1.800  --   FREET4  --  1.08    BNPNo results for input(s): "BNP", "PROBNP" in the last 168 hours.  DDimer No results for input(s): "DDIMER" in the last 168 hours.   Radiology/Studies:  CT ABDOMEN PELVIS WO CONTRAST  Result Date: 06/14/2022 CLINICAL DATA:  Left lower quadrant abdominal pain EXAM: CT ABDOMEN AND PELVIS WITHOUT CONTRAST TECHNIQUE: Multidetector CT imaging of the abdomen and pelvis was performed following the standard protocol without IV contrast. RADIATION DOSE REDUCTION: This exam was performed according to the  departmental dose-optimization program which includes automated exposure control, adjustment of the mA and/or kV according to patient size and/or use of iterative reconstruction technique. COMPARISON:  06/06/2022 FINDINGS: Lower chest: Trace left pleural effusion with mild dependent atelectasis. Heart size is normal. Aortic and coronary artery atherosclerosis. Right-sided gynecomastia. Hepatobiliary: Stable subcentimeter low-density lesions within the liver, too small to characterize. Liver otherwise within normal limits. Unremarkable gallbladder. No hyperdense gallstone. No biliary dilatation. Pancreas: Unremarkable. No pancreatic ductal dilatation or surrounding inflammatory changes. Spleen: Normal in size without focal abnormality. Adrenals/Urinary Tract: Unremarkable adrenal glands. Bilateral renal atrophy. Numerous bilateral renal cysts, stable, and no dedicated imaging follow-up is recommended. No renal stone or hydronephrosis. Urinary bladder is decompressed. Stomach/Bowel: Stomach is within normal limits. Scattered colonic diverticulosis. No evidence of bowel wall thickening, distention, or inflammatory changes. Vascular/Lymphatic: Advanced atherosclerotic calcifications throughout the aortoiliac axis. Prior aorto bi-iliac stent graft, unable to be assessed on noncontrast imaging. No abdominopelvic lymphadenopathy. Reproductive: Prostate is unremarkable. Other: No free fluid. No abdominopelvic fluid collection. No pneumoperitoneum. Postsurgical changes to the anterior abdominal wall. Musculoskeletal: No interval change of lumbar vertebral body compression fractures involving L1, L2, L3, L4, and L5. Prior right hip ORIF. No new or acute bony findings. IMPRESSION: 1. No acute abdominopelvic findings. 2. Trace left pleural effusion with mild dependent atelectasis. 3. Colonic diverticulosis without evidence of acute diverticulitis. 4. Aortic and coronary artery atherosclerosis (ICD10-I70.0). Electronically  Signed   By: Davina Poke D.O.   On: 06/14/2022 14:38   DG Chest 2 View  Result Date: 06/14/2022 CLINICAL DATA:  Chest pain. EXAM: CHEST - 2 VIEW COMPARISON:  CT chest dated June 06, 2022. Chest x-ray dated October 01, 2021. FINDINGS: The heart size and mediastinal contours are within normal limits. Normal pulmonary vascularity. Unchanged small left pleural effusion with adjacent left lower lobe opacity. The right lung is clear. No pneumothorax. No acute osseous abnormality. IMPRESSION: 1. Unchanged small left pleural effusion with adjacent left lower lobe atelectasis versus infiltrate. Electronically Signed   By: Titus Dubin M.D.   On: 06/14/2022 14:08     Assessment and Plan:   Elevated troponin -Troponin 136>>303 (low compared to last admission) -EKG with nonspecific changes -His current main complaint is abdominal pain -History is unreliable due to baseline dementia -Echocardiogram last admission with mildly reduced LV function at 45 to 50% from prior 55 to 60% -Suspect due to to tachycardia from A-fib  2.  Atrial fibrillation, paroxysmal -He was in atrial fibrillation at rate of 100s upon EMS arrival -He is maintaining sinus rhythm while in ER -Did not felt anticoagulation candidate on prior evaluation -Continue amiodarone 200 mg daily.  If recurrent A-fib with RVR, start IV amiodarone  3.  Prolonged QT -Avoid QT prolonging agents  Otherwise per primary team   For questions or updates, please contact [place] Please consult [email or web address] for contact info under    Jarrett Soho, [place]  06/14/2022  7:32 PM   Patient seen and examined.  Agree with above documentation.  [name] is an 81 year old male with a history of CAD, atrial fibrillation, chronic anemia, recurrent GI bleeds, chronic subdural hematoma, ESRD, dementia, PAD, hypertension, OSA who we are consulted at the request of Dr. Oswald Hillock for evaluation of troponin elevation.  Had  recent admission with chest pain and elevated troponin, up to 643.  Thought to likely be demand ischemia and not felt to be a candidate for cardiac catheterization due to his dementia and anemia.  Patient is a difficult historian.  He reports pain in his abdominal.  Given pain EMS was called and he was noted to be in A-fib with RVR on arrival.  Converted to sinus rhythm in route to ED.  Initial vital signs notable for BP 90/53, pulse 61, SPO2 95% on room air.  Labs notable for troponin 136 > 303, hemoglobin 8.8.  Chest x-ray with small left pleural effusion.  EKG with sinus rhythm, rate 64, nonspecific T wave flattening, prolonged QT.  On exam, patient is alert, oriented to person and place, regular rate and rhythm, no murmurs, lungs CTAB, no LE edema.  For his troponin elevation, suspect demand ischemia in setting of his A-fib with RVR.  He is not a candidate for invasive work-up given his dementia and anemia.  He is currently back in sinus rhythm.  Recommend trying to maintain sinus rhythm with amiodarone.  No anticoagulation given his recurrent GI bleeds, chronic subdural hematoma, chronic anemia.  Donato Heinz, MD

## 2022-06-14 NOTE — ED Notes (Signed)
Dr Oswald Hillock made aware patient is a hemodialysis patient and he really urinate and we are unable to get the urinalysis that was order at 1336.

## 2022-06-15 DIAGNOSIS — I208 Other forms of angina pectoris: Secondary | ICD-10-CM | POA: Diagnosis not present

## 2022-06-15 DIAGNOSIS — R9431 Abnormal electrocardiogram [ECG] [EKG]: Secondary | ICD-10-CM | POA: Diagnosis not present

## 2022-06-15 DIAGNOSIS — R0789 Other chest pain: Secondary | ICD-10-CM | POA: Diagnosis not present

## 2022-06-15 DIAGNOSIS — I480 Paroxysmal atrial fibrillation: Secondary | ICD-10-CM

## 2022-06-15 LAB — COMPREHENSIVE METABOLIC PANEL
ALT: 9 U/L (ref 0–44)
AST: 19 U/L (ref 15–41)
Albumin: 2.7 g/dL — ABNORMAL LOW (ref 3.5–5.0)
Alkaline Phosphatase: 105 U/L (ref 38–126)
Anion gap: 14 (ref 5–15)
BUN: 24 mg/dL — ABNORMAL HIGH (ref 8–23)
CO2: 32 mmol/L (ref 22–32)
Calcium: 9 mg/dL (ref 8.9–10.3)
Chloride: 95 mmol/L — ABNORMAL LOW (ref 98–111)
Creatinine, Ser: 6.87 mg/dL — ABNORMAL HIGH (ref 0.61–1.24)
GFR, Estimated: 8 mL/min — ABNORMAL LOW (ref >60)
Glucose, Bld: 89 mg/dL (ref 70–99)
Potassium: 3.9 mmol/L (ref 3.5–5.1)
Sodium: 141 mmol/L (ref 135–145)
Total Bilirubin: 0.3 mg/dL (ref 0.3–1.2)
Total Protein: 5.4 g/dL — ABNORMAL LOW (ref 6.5–8.1)

## 2022-06-15 LAB — GLUCOSE, CAPILLARY
Glucose-Capillary: 80 mg/dL (ref 70–99)
Glucose-Capillary: 88 mg/dL (ref 70–99)
Glucose-Capillary: 151 mg/dL — ABNORMAL HIGH (ref 70–99)
Glucose-Capillary: 81 mg/dL (ref 70–99)

## 2022-06-15 LAB — CBC
HCT: 26.1 % — ABNORMAL LOW (ref 39.0–52.0)
Hemoglobin: 8.3 g/dL — ABNORMAL LOW (ref 13.0–17.0)
MCH: 32.5 pg (ref 26.0–34.0)
MCHC: 31.8 g/dL (ref 30.0–36.0)
MCV: 102.4 fL — ABNORMAL HIGH (ref 80.0–100.0)
Platelets: 193 10*3/uL (ref 150–400)
RBC: 2.55 MIL/uL — ABNORMAL LOW (ref 4.22–5.81)
RDW: 17.3 % — ABNORMAL HIGH (ref 11.5–15.5)
WBC: 4.4 10*3/uL (ref 4.0–10.5)
nRBC: 0.7 % — ABNORMAL HIGH (ref 0.0–0.2)

## 2022-06-15 LAB — PHOSPHORUS: Phosphorus: 4.7 mg/dL — ABNORMAL HIGH (ref 2.5–4.6)

## 2022-06-15 LAB — TROPONIN I (HIGH SENSITIVITY)
Troponin I (High Sensitivity): 680 ng/L (ref <18)
Troponin I (High Sensitivity): 697 ng/L (ref <18)
Troponin I (High Sensitivity): 666 ng/L (ref <18)

## 2022-06-15 LAB — MAGNESIUM: Magnesium: 2.2 mg/dL (ref 1.7–2.4)

## 2022-06-15 MED ORDER — DOXERCALCIFEROL 4 MCG/2ML IV SOLN
5.0000 ug | INTRAVENOUS | Status: DC
  Administered 2022-06-16: 5 ug via INTRAVENOUS
  Filled 2022-06-15: qty 4

## 2022-06-15 MED ORDER — CHLORHEXIDINE GLUCONATE CLOTH 2 % EX PADS
6.0000 | MEDICATED_PAD | Freq: Every day | CUTANEOUS | Status: DC
  Administered 2022-06-16 – 2022-06-17 (×2): 6 via TOPICAL

## 2022-06-15 NOTE — Plan of Care (Signed)
  Problem: Education: Goal: Individualized Educational Video(s) Outcome: Progressing   Problem: Coping: Goal: Ability to adjust to condition or change in health will improve Outcome: Progressing   Problem: Fluid Volume: Goal: Ability to maintain a balanced intake and output will improve Outcome: Progressing   

## 2022-06-15 NOTE — Progress Notes (Signed)
[name] WJX:[id number] DOB: June 10, 1941 DOA: 06/14/2022 PCP: Elby Showers, MD   Subj:   81 y.o. BM PMHx Dementia, ESRD on HD M/W/F, CAD, Hx AAA repair, chronic hypotension on Midodrine, SDH evacuation, DM type II, HLD, , anemia of chronic disease, Hx recurrent GI bleed, gastric and small bowel AVMs, GERD, gout, chronic abdominal pain,   presented to [facility] ED due to left-sided nonexertional chest pain, radiating to his left abdomen.  Recently admitted and discharged 4 days ago with a similar presentation.    Onset of chest pain earlier this morning, during his occupational therapist visit.  Associated with one episode of nausea and vomiting nonbloody nonbilious emesis.  EMS was activated.  The patient was noted to be in A-fib with RVR.  To note, the patient has had a recent referral to GI but unfortunately this has not been completed.     In the ED, work-up revealed elevated high-sensitivity troponin, peaked at 303.  Seen by cardiology, recommended conservative management, no heparin drip, likely demand ischemia in the setting of A-fib with RVR.  Spontaneously converted back to sinus rhythm.  TRH, hospitalist service, was asked to admit.   ED Course: Tmax 98.1.  BP 130/44, pulse 61, respiration rate 19, O2 saturation 95% on room air.  Lab studies remarkable for hemoglobin 8.8, at baseline, MCV 104, WBC 7.3, platelet 213.  Obj: 9/22 A/O x1 (does not know where, when, why).  States having CP rated 3-4/10 radiation to left shoulder with some SOB.  Negative diaphoresis.  Negative radiation to neck.    Objective: VITAL SIGNS: Temp: 97 F (36.1 C) (09/22 0542) Temp Source: Oral (09/22 0542) BP: 113/73 (09/22 0542) Pulse Rate: 77 (09/22 0031) SPO2; FIO2:  No intake or output data in the 24 hours ending 06/15/22 0735   Exam: General: A/O x1 (does not know where, when, why) No acute respiratory distress Lungs: Clear to auscultation bilaterally without wheezes or crackles Cardiovascular:  Regular rate and rhythm without murmur gallop or rub normal S1 and S2 Abdomen: Nontender, nondistended, soft, bowel sounds positive, no rebound, no ascites, no appreciable mass Extremities: No significant cyanosis, clubbing, or edema bilateral lower extremities Skin: Negative rashes, lesions, ulcers Psychiatric: Not able to fully evaluate given his dementia.  Unsure of his baseline.  Pleasant follows all commands Central nervous system:  Cranial nerves II through XII intact, tongue/uvula midline, all extremities muscle strength 5/5, sensation intact throughout,  negative dysarthria, negative expressive aphasia, negative receptive aphasia.  . Mobility Assessment (last 72 hours)     Mobility Assessment     Row Name 06/14/22 2200           Does patient have an order for bedrest or is patient medically unstable No - Continue assessment       What is the highest level of mobility based on the progressive mobility assessment? Level 5 (Walks with assist in room/hall) - Balance while stepping forward/back and can walk in room with assist - Complete                  DVT prophylaxis:  Code Status:  Family Communication:  Status is: Inpatient    Dispo: The patient is from: Home              Anticipated d/c is to: SNF              Anticipated d/c date is: 3 days  Patient currently is not medically stable to d/c.    Procedures/Significant Events: 9/14 Echocardiogram  Left Ventricle: LV apical false tendon (normal variant). Left ventricular  ejection fraction, by estimation, is 45 to 50%. The left ventricle has  mildly decreased function. The left ventricle demonstrates global  hypokinesis. The left ventricular internal   cavity size was normal in size. There is mild left ventricular  hypertrophy. Left ventricular diastolic parameters are consistent with  Grade I diastolic dysfunction (impaired relaxation). Elevated left  ventricular end-diastolic pressure. The E/e'  is 58.   Right Ventricle: The right ventricular size is normal. No increase in  right ventricular wall thickness. Right ventricular systolic function is  normal. There is mildly elevated pulmonary artery systolic pressure. The  tricuspid regurgitant velocity is 2.52   m/s, and with an assumed right atrial pressure of 15 mmHg, the estimated  right ventricular systolic pressure is 75.1 mmHg.   Left Atrium: Left atrial size was normal in size.   Right Atrium: Right atrial size was normal in size.   Pericardium: There is no evidence of pericardial effusion.   Mitral Valve: The mitral valve is abnormal. There is mild thickening of  the anterior and posterior mitral valve leaflet(s). Mild mitral annular  calcification. Mild to moderate mitral valve regurgitation.   Tricuspid Valve: The tricuspid valve is abnormal. Tricuspid valve  regurgitation is mild to moderate.   Aortic Valve: The aortic valve is tricuspid. Aortic valve regurgitation is  not visualized.   Pulmonic Valve: The pulmonic valve was normal in structure. Pulmonic valve  regurgitation is not visualized.   Aorta: The aortic root and ascending aorta are structurally normal, with  no evidence of dilitation.   Venous: The inferior vena cava is dilated in size with less than 50%  respiratory variability, suggesting right atrial pressure of 15 mmHg.   IAS/Shunts: No atrial level shunt detected by color flow Doppler.   Consultants:     Cultures   Antimicrobials:   A/P  Chronic systolic and diastolic CHF, pulmonary HTN -9/14 EF 45 to 50%   A-fib with RVR - Per cardiology note 9/21 they suspect elevated troponin secondary to demand ischemia not a candidate for invasive work-up given his dementia and anemia - 9/21 NSR  Atypical chest pain, rule out ACS Full dose aspirin 325 mg x 1 Resume home Crestor Left chest pain radiating to left lower abdomen Presented with A-fib with RVR Troponin elevated greater than  300, no evidence of acute ischemia on 12-lead EKG, suspected demand ischemia in the setting of A-fib with RVR. Cardiology following, appreciate assistance Recent 2D echo on 06/07/2022 which revealed LVEF 45 to 50% with left ventricle global hypokinesis. Monitor on telemetry  Chronic hypotension Resume home midodrine regimen Maintain MAP greater than 65 Continue to monitor vital signs    Prolonged Qtc Admission twelve-lead EKG, QTc 575. Avoid QTc prolonging agents Electrolytes managed with hemodialysis   Anemia of chronic disease/Hx GI bleed/gastric small bowel AVMs GERD Hemoglobin appears to be at baseline 8.8 No overt bleeding reported Resume home PPI -Transfuse for hemoglobin<7 Lab Results  Component Value Date   HGB 8.3 (L) 06/15/2022   HGB 8.8 (L) 06/14/2022   HGB 8.0 (L) 06/10/2022   HGB 8.2 (L) 06/09/2022   HGB 8.4 (L) 06/08/2022  .   ESRD on HD M/W/F Last hemodialysis on 06/13/2022 Continue hemodialysis inpatient Consult nephrology in the morning to resume HD    Chronic left lower quadrant abdominal pain CT abdomen and pelvis  without contrast was unrevealing. Consider GI consult during this admission   Dementia Resume home regimen Reorient as needed   Type 2 diabetes with hyperglycemia Last hemoglobin A1c 6.5 on 05/17/2022. Start very sensitive insulin sliding scale. Renal, modified diet CBG (last 3)  Recent Labs    06/15/22 0756 06/15/22 1150 06/15/22 1628  GLUCAP 80 88 151*      Hypothyroidism Resume home levothyroxine   Gout Resume home allopurinol   Physical debility -9/22 PT/OT consult A-fib with RVR, systolic and diastolic CHF evaluate for CIR vs SNF       Care during the described time interval was provided by me .  I have reviewed this patient's available data, including medical history, events of note, physical examination, and all test results as part of my evaluation.

## 2022-06-15 NOTE — Evaluation (Signed)
Physical Therapy Evaluation Patient Details Name: [name] MRN: [id number] DOB: 04-17-1941 Today's Date: 06/15/2022  History of Present Illness  81 y.o. male presenting to ED 06/14/22 with chest and abdominal pain. Pt in afib and with elevated troponin. Cardiology suspects demand ischemia in setting of afib with rvr. PMH - dementia, fall w/ R hip fx s/p IM nailing 10/2020, Hx of UTIs, ESRD on HD MWF, PVD, HTN, afib, GI bleed, gout, DVT, SDH.  Clinical Impression  Pt presents not far from baseline with mobility. Spoke with wife who reports she plans to have him return home with [facility]. Will follow acutely to maximize mobility and for dc with HH.        Recommendations for follow up therapy are one component of a multi-disciplinary discharge planning process, led by the attending physician.  Recommendations may be updated based on patient status, additional functional criteria and insurance authorization.  Follow Up Recommendations Home health PT      Assistance Recommended at Discharge Frequent or constant Supervision/Assistance  Patient can return home with the following  A little help with walking and/or transfers;Direct supervision/assist for financial management;Direct supervision/assist for medications management;A little help with bathing/dressing/bathroom    Equipment Recommendations None recommended by PT  Recommendations for Other Services       Functional Status Assessment Patient has had a recent decline in their functional status and demonstrates the ability to make significant improvements in function in a reasonable and predictable amount of time.     Precautions / Restrictions Precautions Precautions: Fall      Mobility  Bed Mobility Overal bed mobility: Needs Assistance Bed Mobility: Supine to Sit     Supine to sit: Supervision     General bed mobility comments: supervision for safety and lines due to dementia    Transfers Overall transfer level: Needs  assistance Equipment used: Rollator (4 wheels) Transfers: Sit to/from Stand Sit to Stand: Min guard           General transfer comment: Assist for safety and lines    Ambulation/Gait Ambulation/Gait assistance: Min guard Gait Distance (Feet): 90 Feet Assistive device: Rollator (4 wheels) Gait Pattern/deviations: Step-through pattern, Decreased stride length Gait velocity: decr Gait velocity interpretation: 1.31 - 2.62 ft/sec, indicative of limited community ambulator   General Gait Details: Assist for safety and Engineer, technical sales    Modified Rankin (Stroke Patients Only)       Balance Overall balance assessment: Needs assistance Sitting-balance support: No upper extremity supported, Feet supported Sitting balance-Leahy Scale: Good     Standing balance support: No upper extremity supported, During functional activity Standing balance-Leahy Scale: Fair                               Pertinent Vitals/Pain Pain Assessment Pain Assessment: No/denies pain    Home Living Family/patient expects to be discharged to:: Private residence Living Arrangements: Spouse/significant other Available Help at Discharge: Family;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 hours/day Type of Home: House Home Access: Stairs to enter   [employer] of Steps: stair lift in the garage   Home Layout: Two level;Able to live on main level with bedroom/bathroom Home Equipment: Rolling Walker (2 wheels);Hospital bed;Transport chair;Wheelchair - manual;Grab bars - toilet Additional Comments: Info from recent admission    Prior Function Prior Level of Function : Patient poor historian/Family not available  Hand Dominance   Dominant Hand: Right    Extremity/Trunk Assessment   Upper Extremity Assessment Upper Extremity Assessment: Defer to OT evaluation    Lower Extremity Assessment Lower Extremity Assessment: Generalized weakness     Cervical / Trunk Assessment Cervical / Trunk Assessment: Kyphotic  Communication   Communication: HOH  Cognition Arousal/Alertness: Awake/alert Behavior During Therapy: WFL for tasks assessed/performed Overall Cognitive Status: No family/caregiver present to determine baseline cognitive functioning                                 General Comments: Pt with history of dementia. Oriented to person only        General Comments      Exercises     Assessment/Plan    PT Assessment Patient needs continued PT services  PT Problem List Decreased strength;Decreased activity tolerance;Decreased balance;Decreased mobility       PT Treatment Interventions DME instruction;Gait training;Functional mobility training;Balance training;Therapeutic exercise;Therapeutic activities;Patient/family education    PT Goals (Current goals can be found in the Care Plan section)  Acute Rehab PT Goals PT Goal Formulation: Patient unable to participate in goal setting Time For Goal Achievement: 06/29/22 Potential to Achieve Goals: Good    Frequency Min 3X/week     Co-evaluation               AM-PAC PT "6 Clicks" Mobility  Outcome Measure Help needed turning from your back to your side while in a flat bed without using bedrails?: None Help needed moving from lying on your back to sitting on the side of a flat bed without using bedrails?: A Little Help needed moving to and from a bed to a chair (including a wheelchair)?: A Little Help needed standing up from a chair using your arms (e.g., wheelchair or bedside chair)?: A Little Help needed to walk in hospital room?: A Little Help needed climbing 3-5 steps with a railing? : A Lot 6 Click Score: 18    End of Session Equipment Utilized During Treatment: Gait belt Activity Tolerance: Patient tolerated treatment well Patient left: in chair;with call bell/phone within reach;with chair alarm set Nurse Communication: Mobility  status PT Visit Diagnosis: Other abnormalities of gait and mobility (R26.89);Muscle weakness (generalized) (M62.81)    Time: 7414-2395 PT Time Calculation (min) (ACUTE ONLY): 27 min   Charges:   PT Evaluation $PT Eval Moderate Complexity: 1 [place] 06/15/2022, 10:32 AM

## 2022-06-15 NOTE — Progress Notes (Addendum)
Sherral Hammers MD aware. RN rounded on patient educated patient on the importance of the troponin lab draw. RN also relayed to patient that it is in the right of the patient to refuse any interventions. Patient verbalize understanding. Patient verbalize "they can come back to draw it". RN to notify phlebotomy to draw troponin.

## 2022-06-15 NOTE — Progress Notes (Signed)
Patient refused stat troponin lab draws. RN paged MD.

## 2022-06-15 NOTE — Consult Note (Addendum)
[name] Consultation Note    Indication for Consultation:  Management of ESRD/hemodialysis, anemia, hypertension/volume, and secondary hyperparathyroidism.  HPI: [name] is a 81 y.o. male with PMH including ESRD on dialysis, dementia, CAD, HTN, and hypothyroidism who presented to the ED with chest pain. Patient is a difficult historian, history is obtained from chart. He was recently discharged with similar presentation of chest pain. He also had one episode of emesis prior to admission. Patient was noted to be in a. Fib with RVR by EMS. Troponin was elevated and cardiology was seen but recommended conservative management. He also has a history of recurrent GI bleed but has not been to his outpatient GI doctor yet. He converted back to his baseline bradycardia overnight. Nephrology has been consulted for management of ESRD.  On exam, patient is irritated and keeps stating "I'm fine, I'm going to go home." He denies any SOB, CP, dizziness, abdominal pain, nausea, vomiting and fever. He does eventually agree to wait to see his primary doctor but states that he is going to do dialysis tomorrow. Alert and oriented x2. K+ 3.9, BUN 24, Cr 6.87, WBC 4.4, Hb 8.3. CXR with small unchanged pleural effusion. VS with mild bradycardia, otherwise stable. He is resting on tele when I see him   Past Medical History:  Diagnosis Date   Allergy    Alzheimer's disease ([name]) 01/19/2021   Anemia    Arthritis    Cataract    bil cateracts removed   Coronary artery disease    Dementia arising in the senium and presenium ([name])    Diabetes mellitus    Type 2   Diverticulitis    ED (erectile dysfunction)    Elevated homocysteine    ESRD (end stage renal disease) on dialysis ([place]) 03/2015   M-W-F dialysis   GERD (gastroesophageal reflux disease)    pepto    Gout    Hiatal hernia    Hyperlipidemia    Hypertension    Hypothyroidism    PVD (peripheral vascular disease) ([name])    has  plastic aorta   Renal insufficiency    Sleep apnea    does not wear c-pap   Past Surgical History:  Procedure Laterality Date   ANGIOPLASTY Right 01/26/2021   Procedure: ANGIOPLASTY RIGHT SUBCLAVIAN VEIN;  Surgeon: Serafina Mitchell, MD;  Location: [place];  Service: Vascular;  Laterality: Right;   aortobifemoral bypass     AV FISTULA PLACEMENT Left 12/01/2013   Procedure: ARTERIOVENOUS (AV) FISTULA CREATION- LEFT BRACHIOCEPHALIC;  Surgeon: Angelia Mould, MD;  Location: [place];  Service: Vascular;  Laterality: Left;   [place] [name] Right 07/27/2014   Procedure: BASCILIC VEIN TRANSPOSITION;  Surgeon: Angelia Mould, MD;  Location: [place];  Service: Vascular;  Laterality: Right;   BRAIN SURGERY  07/22/2018   BREAST SURGERY     left - granulomatous mastitis   BURR HOLE Bilateral 07/22/2018   Procedure: BILATERAL BURR HOLES;  Surgeon: Kristeen Miss, MD;  Location: [place];  Service: Neurosurgery;  Laterality: Bilateral;   COLONOSCOPY     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 ([name])  1992   ENTEROSCOPY N/A 06/03/2018   Procedure: ENTEROSCOPY;  Surgeon: Lavena Bullion, DO;  Location: [name];  Service: Gastroenterology;  Laterality: N/A;   ENTEROSCOPY N/A 11/20/2018   Procedure: ENTEROSCOPY;  Surgeon: Rush Landmark Telford Nab., MD;  Location: [place];  Service: Gastroenterology;  Laterality: N/A;   ENTEROSCOPY N/A 12/03/2019   Procedure:  ENTEROSCOPY;  Surgeon: Lavena Bullion, DO;  Location: [name];  Service: Gastroenterology;  Laterality: N/A;  push enteroscopy   ENTEROSCOPY N/A 07/04/2021   Procedure: ENTEROSCOPY;  Surgeon: Sharyn Creamer, MD;  Location: [name];  Service: Gastroenterology;  Laterality: N/A;   ENTEROSCOPY N/A 11/05/2021   Procedure: ENTEROSCOPY;  Surgeon: Daryel November, MD;  Location: [name];  Service: Gastroenterology;  Laterality: N/A;   EYE SURGERY Bilateral    cataracts   FISTULOGRAM Right 01/26/2021   Procedure:  FISTULOGRAM RIGHT;  Surgeon: Serafina Mitchell, MD;  Location: [name];  Service: Vascular;  Laterality: Right;   HEMODIALYSIS INPATIENT  01/17/2018       HOT HEMOSTASIS N/A 06/03/2018   Procedure: HOT HEMOSTASIS (ARGON PLASMA COAGULATION/BICAP);  Surgeon: Lavena Bullion, DO;  Location: [name];  Service: Gastroenterology;  Laterality: N/A;   HOT HEMOSTASIS N/A 11/20/2018   Procedure: HOT HEMOSTASIS (ARGON PLASMA COAGULATION/BICAP);  Surgeon: Irving Copas., MD;  Location: [name];  Service: Gastroenterology;  Laterality: N/A;   HOT HEMOSTASIS N/A 12/03/2019   Procedure: HOT HEMOSTASIS (ARGON PLASMA COAGULATION/BICAP);  Surgeon: Lavena Bullion, DO;  Location: [name];  Service: Gastroenterology;  Laterality: N/A;   INTRAMEDULLARY (IM) NAIL INTERTROCHANTERIC Right 11/08/2020   Procedure: INTRAMEDULLARY (IM) NAIL INTERTROCHANTRIC;  Surgeon: Erle Crocker, MD;  Location: [place];  Service: Orthopedics;  Laterality: Right;   REVISION OF ARTERIOVENOUS GORETEX GRAFT Right 09/01/2019   Procedure: REVISION OF ARTERIOVENOUS FISTULA RIGHT ARM;  Surgeon: Serafina Mitchell, MD;  Location: [name];  Service: Vascular;  Laterality: Right;   REVISON OF ARTERIOVENOUS FISTULA Left 02/09/2014   Procedure: REVISON OF LEFT ARTERIOVENOUS FISTULA - RESECTION OF RENDUNDANT VEIN;  Surgeon: Angelia Mould, MD;  Location: [place];  Service: Vascular;  Laterality: Left;   REVISON OF ARTERIOVENOUS FISTULA Right 01/26/2021   Procedure: REVISON OF ARTERIOVENOUS FISTULA RIGHT;  Surgeon: Serafina Mitchell, MD;  Location: [name];  Service: Vascular;  Laterality: Right;   SBO with lysis adhesions     SHUNTOGRAM Left 04/19/2014   Procedure: FISTULOGRAM;  Surgeon: Angelia Mould, MD;  Location: [name];  Service: Cardiovascular;  Laterality: Left;   UNILATERAL UPPER EXTREMEITY ANGIOGRAM N/A 07/12/2014   Procedure: UNILATERAL UPPER Anselmo Rod;  Surgeon: Angelia Mould, MD;   Location: [name];  Service: Cardiovascular;  Laterality: N/A;   Family History  Problem Relation Age of Onset   Aneurysm Mother        brain   Heart disease Father    Stroke Father    Hypertension Father    Diabetes Father    Dementia Neg [name]    Colon cancer Neg [name]    Esophageal cancer Neg [name]    Pancreatic cancer Neg [name]    Prostate cancer Neg [name]    Rectal cancer Neg [name]    Stomach cancer Neg [name]    Social History:  reports that he quit smoking about 27 years ago. His smoking use included cigarettes. He has never been exposed to tobacco smoke. He has never used smokeless tobacco. He reports that he does not drink alcohol and does not use drugs.  ROS: As per HPI otherwise negative.  Physical Exam: Vitals:   06/15/22 0500 06/15/22 0542 06/15/22 1037 06/15/22 1100  BP:  113/73 (!) 102/55 (!) 120/55  Pulse:   (!) 57 (!) 56  Resp:  15 11 12   Temp: (!) 97 F (36.1 C) (!) 97 F (36.1 C)  97.9 F (36.6 C)  TempSrc: Oral Oral  Oral  SpO2:   99% 98%  Weight:  70.4 kg    Height:         General: Elderly male, alert and in NAD Head: Normocephalic, atraumatic, sclera non-icteric, mucus membranes are moist. Neck: JVD not elevated. Lungs: Clear bilaterally to auscultation without wheezes, rales, or rhonchi. Breathing is unlabored on RA Heart: RRR with normal S1, S2. No murmurs, rubs, or gallops appreciated. Abdomen: Soft, non-distended with normoactive bowel sounds Musculoskeletal:  Strength and tone appear normal for age. Lower extremities: No edema or ischemic changes, no open wounds. Neuro: Alert and oriented X 2. Moves all extremities spontaneously. Dialysis Access: AVF + bruit  Allergies  Allergen Reactions   Ambien [Zolpidem Tartrate] Other (See Comments)    Hallucinations and "felt crazy"    Penicillins Hives and Rash    Has patient had a PCN reaction causing immediate rash, facial/tongue/throat swelling, SOB or lightheadedness with hypotension: Yes Has patient had a  PCN reaction causing severe rash involving mucus membranes or skin necrosis: Yes Has patient had a PCN reaction that required hospitalization: No Has patient had a PCN reaction occurring within the last 10 years: No If all of the above answers are "NO", then may proceed with Cephalosporin use.    Prior to Admission medications   Medication Sig Start Date End Date Taking? Authorizing Provider  acetaminophen (TYLENOL) 325 MG tablet Take 1-2 tablets (325-650 mg total) by mouth every 4 (four) hours as needed for mild pain. Patient taking differently: Take 650 mg by mouth in the morning and at bedtime. 08/01/18  Yes Love, Ivan Anchors, PA-C  albuterol (VENTOLIN HFA) 108 (90 Base) MCG/ACT inhaler Inhale 1-2 puffs into the lungs daily as needed for wheezing or shortness of breath.   Yes [provider]  allopurinol (ZYLOPRIM) 100 MG tablet Take 100 mg by mouth daily.   Yes [provider]  amiodarone (PACERONE) 200 MG tablet Take 1 tablet (200 mg total) by mouth 2 (two) times daily for 12 days, THEN 1 tablet (200 mg total) daily. 06/10/22 06/22/23 Yes Eugenie Filler, MD  camphor-menthol [place] [name]) lotion Apply 1 application topically 4 (four) times daily as needed for itching.    Yes [provider]  Carboxymethylcellulose Sodium (THERATEARS) 0.25 % SOLN Place 1 drop into both eyes 3 (three) times daily as needed (for dryness).   Yes [provider]  cetirizine (ZYRTEC) 10 MG tablet Take 10 mg by mouth daily as needed for allergies.   Yes [provider]  cyanocobalamin (,VITAMIN B-12,) 1000 MCG/ML injection Inject 1 mL (1,000 mcg total) into the muscle every 30 (thirty) days. 04/13/22  Yes Baxley, Cresenciano Lick, MD  donepezil (ARICEPT) 10 MG tablet Take 1 tablet (10 mg total) by mouth at bedtime. 09/26/21  Yes Melvenia Beam, MD  fluticasone (FLONASE) 50 MCG/ACT nasal spray Place 1 spray into both nostrils daily. Patient taking differently: Place 2 sprays into both  nostrils daily as needed for allergies. 10/08/14  Yes Mikhail, Maryann, DO  FORTEO 600 MCG/2.4ML SOPN INJECT 20 MCG UNDER THE SKIN DAILY (DISCARD 28 DAYS AFTER INITIAL USE) Patient taking differently: Inject 20 mcg into the skin at bedtime. (DISCARD 28 DAYS AFTER INITIAL USE) 05/14/22  Yes Elayne Snare, MD  hydrocortisone 2.5 % cream Apply 1 application topically 3 (three) times daily as needed (skin irritation (legs & arms)).  01/13/19  Yes [provider]  levothyroxine (SYNTHROID, LEVOTHROID) 50 MCG tablet Take  1 tablet (50 mcg total) by mouth daily. Patient taking differently: Take 50 mcg by mouth daily before breakfast. 08/06/14  Yes Baxley, Cresenciano Lick, MD  memantine (NAMENDA) 10 MG tablet Take 1 tablet (10 mg total) by mouth 2 (two) times daily. 09/26/21  Yes Melvenia Beam, MD  midodrine (PROAMATINE) 10 MG tablet Take one pill prior to hemodialysis on MWF Patient taking differently: Take 10 mg by mouth See admin instructions. Take 10 mg by mouth prior to hemodialysis on Mon/Wed/Fri 08/13/18  Yes Love, Ivan Anchors, PA-C  Nutritional Supplements (FEEDING SUPPLEMENT, NEPRO CARB STEADY,) LIQD Take 237 mLs by mouth in the morning.   Yes [provider]  omeprazole (PRILOSEC) 40 MG capsule Take 1 capsule (40 mg total) by mouth daily. Patient taking differently: Take 40 mg by mouth daily before breakfast. 03/13/18  Yes Esterwood, Amy S, PA-C  QUEtiapine (SEROQUEL) 25 MG tablet Take 1 tablet (25 mg total) by mouth at bedtime. 05/22/22  Yes Baxley, Cresenciano Lick, MD  repaglinide (PRANDIN) 0.5 MG tablet TAKE 1 TABLET(0.5 MG) BY MOUTH TWICE DAILY BEFORE A MEAL Patient taking differently: Take 0.5 mg by mouth See admin instructions. Take 0.5 mg by mouth at 5 pm (AFTER dialysis) only on Mon/Wed/Fri 05/29/22  Yes Elayne Snare, MD  rosuvastatin (CRESTOR) 10 MG tablet Take 1 tablet (10 mg total) by mouth daily. 06/11/22  Yes Eugenie Filler, MD  sevelamer carbonate (RENVELA) 800 MG tablet Take 1,600-2,400 mg by  mouth See admin instructions. Take 2,400 mg by mouth three times a day with meals and 1,600 mg with snacks   Yes [provider]  TRADJENTA 5 MG TABS tablet Take 1 tablet (5 mg total) by mouth daily. 11/16/21  Yes Elayne Snare, MD  Blood Glucose Monitoring Suppl (FREESTYLE FREEDOM LITE) w/Device KIT Use to check blood sugar 2 times per day dx code E11.65 10/21/18   Elayne Snare, MD  glucose blood (FREESTYLE LITE) test strip USE AS DIRECTED THREE TIMES DAILY 02/16/22   Elayne Snare, MD  Lancets (FREESTYLE) lancets Use as instructed to check blood sugar 2 times per day dx code E11.65 02/22/16   Elayne Snare, MD  Syringe/Needle, Disp, (SYRINGE 3CC/25GX1") 25G X 1" 3 ML MISC 1 each by Does not apply route every 30 (thirty) days. 04/13/22   Elby Showers, MD   Current Facility-Administered Medications  Medication Dose Route Frequency Provider Last Rate Last Admin   albuterol (PROVENTIL) (2.5 MG/3ML) 0.083% nebulizer solution 2.5 mg  2.5 mg Inhalation Daily PRN Irene Pap N, DO       allopurinol (ZYLOPRIM) tablet 100 mg  100 mg Oral Daily Irene Pap N, DO   100 mg at 06/15/22 1043   heparin injection 5,000 Units  5,000 Units Subcutaneous Q8H Irene Pap N, DO   5,000 Units at 06/15/22 1438   insulin aspart (novoLOG) injection 0-5 Units  0-5 Units Subcutaneous QHS Hall, Carole N, DO       insulin aspart (novoLOG) injection 0-6 Units  0-6 Units Subcutaneous TID WC Hall, Carole N, DO       levothyroxine (SYNTHROID) tablet 50 mcg  50 mcg Oral QAC breakfast Hall, Carole N, DO       midodrine (PROAMATINE) tablet 10 mg  10 mg Oral Q M,W,F Hall, Carole N, DO   10 mg at 06/15/22 1043   pantoprazole (PROTONIX) EC tablet 40 mg  40 mg Oral Daily Irene Pap N, DO   40 mg at 06/15/22 1043   QUEtiapine (  SEROQUEL) tablet 25 mg  25 mg Oral QHS Irene Pap N, DO   25 mg at 06/14/22 2203   repaglinide (PRANDIN) tablet 0.5 mg  0.5 mg Oral Q M,W,F-1800 Hall, Carole N, DO       rosuvastatin (CRESTOR) tablet 10 mg   10 mg Oral Daily Irene Pap N, DO   10 mg at 06/15/22 1043   sevelamer carbonate (RENVELA) tablet 1,600 mg  1,600 mg Oral PRN Kayleen Memos, DO       sevelamer carbonate (RENVELA) tablet 2,400 mg  2,400 mg Oral TID WC Irene Pap N, DO   2,400 mg at 06/15/22 1203   Labs: Basic Metabolic Panel: Recent Labs  [name] 06/09/22 0210 06/10/22 0123 06/14/22 1350 06/14/22 1931 06/15/22 0451  NA 138 135 139  --  141  K 3.9 4.2 4.1  --  3.9  CL 97* 94* 95*  --  95*  CO2 33* 30 32  --  32  GLUCOSE 93 112* 109*  --  89  BUN 17 29* 19  --  24*  CREATININE 5.18* 7.23* 5.63* 6.20* 6.87*  CALCIUM 9.1 8.8* 9.2  --  9.0  PHOS 3.6 3.6  --   --  4.7*   Liver Function Tests: Recent Labs  [name] 06/10/22 0123 06/14/22 1350 06/15/22 0451  AST  --  21 19  ALT  --  10 9  ALKPHOS  --  115 105  BILITOT  --  0.2* 0.3  PROT  --  5.9* 5.4*  ALBUMIN 2.7* 2.9* 2.7*   Recent Labs  [name] 06/14/22 1350  LIPASE 39   No results for input(s): "AMMONIA" in the last 168 hours. CBC: Recent Labs  [name] 06/09/22 0210 06/10/22 0123 06/14/22 1350 06/15/22 0451  WBC 6.6 7.0 7.3 4.4  NEUTROABS  --   --  4.9  --   HGB 8.2* 8.0* 8.8* 8.3*  HCT 25.3* 24.6* 28.0* 26.1*  MCV 100.0 100.8* 104.9* 102.4*  PLT 190 181 213 193   Cardiac Enzymes: No results for input(s): "CKTOTAL", "CKMB", "CKMBINDEX", "TROPONINI" in the last 168 hours. CBG: Recent Labs  [name] 06/10/22 1116 06/14/22 1615 06/14/22 2217 06/15/22 0756 06/15/22 1150  GLUCAP 128* 98 135* 80 88   Iron Studies: No results for input(s): "IRON", "TIBC", "TRANSFERRIN", "FERRITIN" in the last 72 hours. Studies/Results: CT ABDOMEN PELVIS WO CONTRAST  Result Date: 06/14/2022 CLINICAL DATA:  Left lower quadrant abdominal pain EXAM: CT ABDOMEN AND PELVIS WITHOUT CONTRAST TECHNIQUE: Multidetector CT imaging of the abdomen and pelvis was performed following the standard protocol without IV contrast. RADIATION DOSE REDUCTION: This exam was performed according to  the departmental dose-optimization program which includes automated exposure control, adjustment of the mA and/or kV according to patient size and/or use of iterative reconstruction technique. COMPARISON:  06/06/2022 FINDINGS: Lower chest: Trace left pleural effusion with mild dependent atelectasis. Heart size is normal. Aortic and coronary artery atherosclerosis. Right-sided gynecomastia. Hepatobiliary: Stable subcentimeter low-density lesions within the liver, too small to characterize. Liver otherwise within normal limits. Unremarkable gallbladder. No hyperdense gallstone. No biliary dilatation. Pancreas: Unremarkable. No pancreatic ductal dilatation or surrounding inflammatory changes. Spleen: Normal in size without focal abnormality. Adrenals/Urinary Tract: Unremarkable adrenal glands. Bilateral renal atrophy. Numerous bilateral renal cysts, stable, and no dedicated imaging follow-up is recommended. No renal stone or hydronephrosis. Urinary bladder is decompressed. Stomach/Bowel: Stomach is within normal limits. Scattered colonic diverticulosis. No evidence of bowel wall thickening, distention, or inflammatory changes. Vascular/Lymphatic: Advanced atherosclerotic calcifications throughout the  aortoiliac axis. Prior aorto bi-iliac stent graft, unable to be assessed on noncontrast imaging. No abdominopelvic lymphadenopathy. Reproductive: Prostate is unremarkable. Other: No free fluid. No abdominopelvic fluid collection. No pneumoperitoneum. Postsurgical changes to the anterior abdominal wall. Musculoskeletal: No interval change of lumbar vertebral body compression fractures involving L1, L2, L3, L4, and L5. Prior right hip ORIF. No new or acute bony findings. IMPRESSION: 1. No acute abdominopelvic findings. 2. Trace left pleural effusion with mild dependent atelectasis. 3. Colonic diverticulosis without evidence of acute diverticulitis. 4. Aortic and coronary artery atherosclerosis (ICD10-I70.0). Electronically  Signed   By: Davina Poke D.O.   On: 06/14/2022 14:38   DG Chest 2 View  Result Date: 06/14/2022 CLINICAL DATA:  Chest pain. EXAM: CHEST - 2 VIEW COMPARISON:  CT chest dated June 06, 2022. Chest x-ray dated October 01, 2021. FINDINGS: The heart size and mediastinal contours are within normal limits. Normal pulmonary vascularity. Unchanged small left pleural effusion with adjacent left lower lobe opacity. The right lung is clear. No pneumothorax. No acute osseous abnormality. IMPRESSION: 1. Unchanged small left pleural effusion with adjacent left lower lobe atelectasis versus infiltrate. Electronically Signed   By: Titus Dubin M.D.   On: 06/14/2022 14:08    Dialysis Orders: Center: [facility]  on MWF. 180NRe 3 hr 45 min BFR 400 DFR Auto 1.5 EDW 70.5kg 2K 2Ca AVF 15g no heparin Mircera 141mg IV q 2 weeks- last dose 1254m on 9/18 Hectorol 65m48mIV q HD   Assessment/Plan:  Chest pain: in a. Fib RVR on arrival but now in sinus rhythm. Conservative treatment recommended, management per cardiology  ESRD:  MWF schedule, no volume overload on exam. He is refusing dialysis today but says he will go tomorrow morning. No symptoms of uremia and labs ok, HD unit has high census tonight so will plan for HD tomorrow morning.   Hypertension/volume: BP controlled, no volume overload on exam and is close to his EDW. Takes midodrine 28m31m pre HD, will continue here.   Anemia: Hb 8.3, [name] of recurrent GI bleed but no active bleeding reported. No heparin with HD. Continue ESA.   Metabolic bone disease: Corrected calcium elevated, will reduce hectorol dose. Phos at goal  Nutrition:  Renal diet/fluid restriction  SamaAnice Paganini-C 06/15/2022, 3:24 PM  [name] Pager: [phone] seen and examined, agree with above note with above modifications. ESRD pt with dementia who presented with CP-  found to be in Afib with RVR which spontaneously converted.   Today is his dialysis day but his labs do not show indication/HD is at capacity tonight and pt is refusing to get HD tonight.  Plan is for HD tomorrow off schedule via AVF.  Hgb low but stable with a history of GIB-  no heparin with HD KellCorliss Parish 06/15/2022

## 2022-06-15 NOTE — TOC Initial Note (Signed)
Transition of Care [facility]) - Initial/Assessment Note    Patient Details  Name: [name] MRN: [id number] Date of Birth: May 09, 1941  Transition of [facility]) CM/SW Contact:    Zenon Mayo, RN Phone Number: 06/15/2022, 3:48 PM  Clinical Narrative:                 From home with wife, presenrs with chest pain, trops elevated, cards consulted.  Pt eval rec HHPT,  NCM spoke with patient , offered choice from Medicare. Gov list, he states to call his wife.  NCM contacted wife, offered choice, she states that  they are active with [facility] for [place], [place], but they are not sure if they want to continue with the OT.  But would like the PT.  NCM confirmed with [facility] with [facility] patient is active.  Patient has a walker and a w/char at home.  Wife will transport him home at dc.  Patient [facility] med[facility].  Expected Discharge Plan: [place] Barriers to Discharge: Continued Medical Work up   Patient Goals and CMS Choice Patient states their goals for this hospitalization and ongoing recovery are:: return home CMS Medicare.gov Compare Post Acute Care list provided to:: Patient Represenative (must comment) (spouse) Choice offered to / list presented to : Spouse  Expected Discharge Plan and Services Expected Discharge Plan: [place] In-house Referral: NA Discharge Planning Services: CM Consult Post Acute Care Choice: [place] arrangements for the past 2 months: Single Family Home                   DME Agency: NA       HH Arranged: PT HH Agency: [place] Date [facility] Agency Contacted: 06/15/22 Time HH Agency Contacted: 70 Representative spoke with at [place]: Tommi Rumps  Prior Living Arrangements/Services Living arrangements for the past 2 months: [place] Lives with:: Spouse Patient language and need for interpreter reviewed:: Yes Do you feel safe going back to the place where you  live?: Yes      Need for Family Participation in Patient Care: Yes (Comment) Care giver support system in place?: Yes (comment) Current home services: DME, Home PT, Home OT (walker,w/chair, active with [facility] for HHPT) Criminal Activity/Legal Involvement Pertinent to Current Situation/Hospitalization: No - Comment as needed  Activities of Daily Living      Permission Sought/Granted Permission sought to share information with : Case Manager                Emotional Assessment Appearance:: Appears stated age Attitude/Demeanor/Rapport: Guarded Affect (typically observed): Agitated Orientation: : Oriented to Self, Oriented to Place, Oriented to Situation Alcohol / Substance Use: Not Applicable Psych Involvement: No (comment)  Admission diagnosis:  Chest pain [R07.9] Patient Active Problem List   Diagnosis Date Noted   Prolonged QT interval    Acute systolic heart failure ([place])    Chest pain 06/06/2022   Abdominal pain 06/06/2022   Volume overload 06/06/2022   Skin ulcer, limited to breakdown of skin ([place]) 98/33/8250   Chronic systolic congestive heart failure ([place]) 04/12/2022   Depression, major, single episode, moderate ([place]) 04/12/2022   Paroxysmal atrial fibrillation ([place]) 04/12/2022   UTI (urinary tract infection) 11/05/2021   Pressure injury of skin 11/05/2021   Acute blood loss anemia    Acute GI bleeding 11/04/2021   Pulmonary nodule 10/05/2021   Acute encephalopathy 10/02/2021   Hypernatremia 10/02/2021  Elevated troponin 07/04/2021   Mixed diabetic hyperlipidemia associated with type 2 diabetes mellitus ([place]) 07/04/2021   Alzheimer's disease, unspecified (CODE) ([place]) 01/24/2021   Senile nuclear sclerosis 11/22/2020   Other specified erythematous condition 11/22/2020   Other specified disease of sebaceous glands 11/22/2020   Osteoporosis 11/22/2020   Obesity 11/22/2020   Macrocytosis 11/22/2020   Dystrophia unguium 11/22/2020   Tinea pedis 11/22/2020    Pre-operative clearance    Closed nondisplaced intertrochanteric fracture of right femur ([place]) 11/07/2020   Hip fracture ([place]) 11/07/2020   Fall    Rectal bleeding 04/22/2020   AVM (arteriovenous malformation) of small bowel, acquired    Hiatal hernia    Schatzki's ring of distal esophagus    Other bacterial infections of unspecified site 08/31/2019   Hypocalcemia 04/06/2019   Melena 11/19/2018   Leukocytosis    Anemia    TBI (traumatic brain injury) ([place]) 07/29/2018   Diabetes mellitus type 2 in nonobese (HCC)    Loose stools    Dementia without behavioral disturbance (HCC)    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   Multiple fractures of ribs, left side, initial encounter for closed fracture 07/21/2018   Subdural hematoma (HCC) 07/21/2018   Gastritis, unspecified, with bleeding 06/04/2018   Angiodysplasia of stomach    Occult GI bleeding    GIB (gastrointestinal bleeding) 06/01/2018   Low back pain 01/17/2018   Anorexia    Diverticulitis of intestine, part unspecified, without perforation or abscess without bleeding 08/07/2017   Dyspnea    Macrocytic anemia 06/04/2016   Thrombocytopenia ([place]) 06/04/2016   Exertional dyspnea 06/04/2016   Arm paresthesia, left 06/04/2016   Dyspnea on exertion 06/04/2016   Tenderness of right calf 06/04/2016   Mild cognitive impairment with memory loss 01/12/2016   ESRD on dialysis ([place]) 05/17/2015   Hyperlipidemia, unspecified 02/25/2015   Pruritus, unspecified 02/25/2015   Pain, unspecified 02/25/2015   Other specified coagulation defects ([place]) 02/25/2015   Iron deficiency anemia, unspecified 02/25/2015   Headache, unspecified 02/25/2015   Gout, unspecified 02/25/2015   Fever, unspecified 02/25/2015   Diarrhea, unspecified 02/25/2015   Acute on chronic anemia 02/25/2015   Acute embolism and thrombosis of left subclavian vein ([place]) 02/25/2015   Hypoglycemia    Weakness 02/18/2015   OSA (obstructive sleep  apnea) 02/02/2015   Hyperkalemia 10/05/2014   ESRD (end stage renal disease) ([place]) 11/18/2013   BPH (benign prostatic hyperplasia) 11/22/2012   Peripheral vascular disease ([place]) 12/24/2011   Hyperparathyroidism ([place]) 12/24/2011   Hypothyroidism 12/24/2011   Allergic rhinitis 12/24/2011   Erectile dysfunction 12/24/2011   Insulin dependent diabetes mellitus 09/03/2008   CAD in native artery 09/03/2008   PANCREATITIS, HX OF 08/30/2008   RENAL FAILURE, ACUTE, HX OF 08/30/2008   DIVERTICULOSIS, COLON 06/08/2003   PCP:  Elby Showers, MD Pharmacy:   [facility] [place], [place] [address] [place] [address] Phone: [phone] Fax: [place], [place] - [place] [address] [address] [place] [address] Phone: [phone] Fax: [phone]  [place], [place] [address] [place] [address] Phone: [phone] Fax: [phone]     Social Determinants of Health (SDOH) Interventions    Readmission Risk Interventions    06/15/2022    3:42 PM 06/10/2022    2:43 PM 11/06/2021   12:09 PM  Readmission Risk Prevention Plan  Transportation Screening Complete Complete Complete  PCP or Specialist Appt within 3-5 Days   Complete  HRI or Home Care Consult   Complete  Social Work Consult for [place] Planning/Counseling   Complete  Palliative Care Screening   Not Applicable  Medication Review Press photographer) Complete Complete Complete  PCP or Specialist appointment within 3-5 days of discharge Complete Complete   HRI or Home Care Consult Complete Complete   SW Recovery Care/Counseling Consult Complete Complete   Palliative Care Screening Not Applicable Not [place] Not Applicable Not Applicable

## 2022-06-15 NOTE — Consult Note (Signed)
   [facility] Inpatient Consult   06/15/2022  VICK FILTER 27-Jun-1941 [id number]  [place] Organization [ACO] Patient:  Primary Care Provider:  Elby Showers, MD  Patient screened for hospitalization with noted extreme high risk score for unplanned readmission risk and to assess for potential [place] Management service needs for post hospital transition.  12:07 pm Came by to speak with patient and no family currently at the bedside as patient had been working with PT and staff in the room.  Plan:  Continue to follow progress and disposition to assess for post hospital care management needs.  Currently, the disposition is to return home with wife per PT.  For questions contact:   Natividad Brood, RN BSN [facility] Liaison  [phone] business mobile phone Toll free office [phone]  Fax number: [phone] [email or web address]

## 2022-06-15 NOTE — Plan of Care (Signed)
  Problem: Coping: Goal: Ability to adjust to condition or change in health will improve Outcome: Progressing   Problem: Health Behavior/Discharge Planning: Goal: Ability to manage health-related needs will improve Outcome: Progressing   

## 2022-06-15 NOTE — Evaluation (Signed)
Occupational Therapy Evaluation Patient Details Name: [name] MRN: [id number] DOB: September 28, 1940 Today's Date: 06/15/2022   History of Present Illness 81 y.o. male presenting to ED 06/14/22 with chest and abdominal pain. Pt in afib and with elevated troponin. Cardiology suspects demand ischemia in setting of afib with rvr. PMH - dementia, fall w/ R hip fx s/p IM nailing 10/2020, Hx of UTIs, ESRD on HD MWF, PVD, HTN, afib, GI bleed, gout, DVT, SDH.   Clinical Impression   Pt admitted with the above diagnosis and has the deficits outlined below. Pt would benefit from acute OT to increase independence and safety with basic adls to the mod I level of care so burden on wife at home might be decreased. Wife has been taking care of pt at home and since pt is at min guard level with most adls, feel pt is safe to d/c home when medically stable. Will focus on balance in standing during adls and increasing activity tolerance.      Recommendations for follow up therapy are one component of a multi-disciplinary discharge planning process, led by the attending physician.  Recommendations may be updated based on patient status, additional functional criteria and insurance authorization.   Follow Up Recommendations  No OT follow up    Assistance Recommended at Discharge Frequent or constant Supervision/Assistance  Patient can return home with the following A little help with walking and/or transfers;A little help with bathing/dressing/bathroom;Assistance with cooking/housework;Direct supervision/assist for financial management;Assist for transportation;Help with stairs or ramp for entrance;Direct supervision/assist for medications management    Functional Status Assessment  Patient has had a recent decline in their functional status and demonstrates the ability to make significant improvements in function in a reasonable and predictable amount of time.  Equipment Recommendations  None recommended by OT     Recommendations for Other Services       Precautions / Restrictions Precautions Precautions: Fall Restrictions Weight Bearing Restrictions: No      Mobility Bed Mobility Overal bed mobility: Needs Assistance Bed Mobility: Supine to Sit     Supine to sit: Supervision Sit to supine: Supervision   General bed mobility comments: supervision for safety and lines due to dementia    Transfers Overall transfer level: Needs assistance Equipment used: Rollator (4 wheels) Transfers: Sit to/from Stand Sit to Stand: Min guard           General transfer comment: Assist for safety and lines      Balance Overall balance assessment: Needs assistance Sitting-balance support: No upper extremity supported, Feet supported Sitting balance-Leahy Scale: Good Sitting balance - Comments: static sitting without assist   Standing balance support: No upper extremity supported, During functional activity Standing balance-Leahy Scale: Fair Standing balance comment: Rw for gait                           ADL either performed or assessed with clinical judgement   ADL Overall ADL's : Needs assistance/impaired Eating/Feeding: Set up;Sitting   Grooming: Wash/dry hands;Wash/dry face;Oral care;Standing;Min guard Grooming Details (indicate cue type and reason): pt stood at sink to wash hands Upper Body Bathing: Set up;Sitting   Lower Body Bathing: Minimal assistance;Sit to/from stand;Cueing for compensatory techniques   Upper Body Dressing : Set up;Sitting   Lower Body Dressing: Minimal assistance;Sit to/from stand   Toilet Transfer: Min guard;Ambulation;Rolling walker (2 wheels)   Toileting- Clothing Manipulation and Hygiene: Min guard;Sit to/from stand       Functional mobility  during ADLs: Engineer, manufacturing (4 wheels) General ADL Comments: Pt requires occasional min assist for physical assist with LE adls and cues for memory     Vision Baseline Vision/History: 0 No  visual deficits Ability to See in Adequate Light: 0 Adequate Patient Visual Report: No change from baseline Vision Assessment?: No apparent visual deficits     Perception     Praxis      Pertinent Vitals/Pain Pain Assessment Pain Assessment: No/denies pain Pain Score: 0-No pain     Hand Dominance Right   Extremity/Trunk Assessment Upper Extremity Assessment Upper Extremity Assessment: Overall WFL for tasks assessed   Lower Extremity Assessment Lower Extremity Assessment: Defer to PT evaluation   Cervical / Trunk Assessment Cervical / Trunk Assessment: Kyphotic   Communication Communication Communication: HOH   Cognition Arousal/Alertness: Awake/alert Behavior During Therapy: WFL for tasks assessed/performed Overall Cognitive Status: History of cognitive impairments - at baseline Area of Impairment: Orientation, Attention, Memory, Following commands, Safety/judgement                 Orientation Level: Disoriented to, Place, Situation, Time Current Attention Level: Sustained Memory: Decreased short-term memory Following Commands: Follows one step commands inconsistently, Follows one step commands with increased time Safety/Judgement: Decreased awareness of safety, Decreased awareness of deficits     General Comments: Pt with history of dementia. Oriented to person only     General Comments  Pt most limited by decreased motiviation to move, slight balance deficits and h/o dementia making safety awareness difficult.    Exercises     Shoulder Instructions      Home Living Family/patient expects to be discharged to:: Private residence Living Arrangements: Spouse/significant other Available Help at Discharge: Family;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 hours/day Type of Home: House Home Access: Stairs to enter [employer] of Steps: stair lift in the garage Entrance Stairs-Rails: Right;Left;Can reach both Home Layout: Two level;Able to live on main level with  bedroom/bathroom     Bathroom Shower/Tub: Tub/shower unit;Sponge bathes at baseline   Bathroom Toilet: Handicapped height Bathroom Accessibility: Yes How Accessible: Accessible via walker Home Equipment: [place] (2 wheels);Hospital bed;Transport chair;Wheelchair - manual;Grab bars - toilet   Additional Comments: Info from recent admission      Prior Functioning/Environment Prior Level of Function : Patient poor historian/Family not available             Mobility Comments: pt reports pt walks with rollator ADLs Comments: Personal care attendant;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 hours/day (PCA 5 days/wk MWF 8:30-11am and T,TH 9-12am.)        OT Problem List: Impaired balance (sitting and/or standing);Decreased safety awareness;Decreased cognition      OT Treatment/Interventions: Self-care/ADL training;Therapeutic activities;DME and/or AE instruction;Balance training    OT Goals(Current goals can be found in the care plan section) Acute Rehab OT Goals Patient Stated Goal: to get out of here OT Goal Formulation: With patient Time For Goal Achievement: 06/29/22 Potential to Achieve Goals: Fair ADL Goals Additional ADL Goal #1: Pt will walk to bathroom and complete all toileting with supervision. Additional ADL Goal #2: Pt will gather all clothing with rollator and dress self with supervision.  OT Frequency: Min 2X/week    Co-evaluation              AM-PAC OT "6 Clicks" Daily Activity     Outcome Measure Help from another person eating meals?: None Help from another person taking care of personal grooming?: None Help from another person toileting, which includes using toliet, bedpan, or urinal?: A Little  Help from another person bathing (including washing, rinsing, drying)?: A Little Help from another person to put on and taking off regular upper body clothing?: None Help from another person to put on and taking off regular lower body clothing?: A Little 6 Click Score: 21   End  of Session Equipment Utilized During Treatment: Rolling walker (2 wheels) Nurse Communication: Mobility status  Activity Tolerance: Patient tolerated treatment well Patient left: with call bell/phone within reach;in bed;Other (comment) (EKG in to do test)  OT Visit Diagnosis: Unsteadiness on feet (R26.81);Other symptoms and signs involving cognitive function                Time: 1012-1025 OT Time Calculation (min): 13 min Charges:  OT General Charges $OT Visit: 1 Visit OT Evaluation $OT Eval Low Complexity: 1 Low  Glenford Peers 06/15/2022, 10:39 AM

## 2022-06-15 NOTE — Progress Notes (Signed)
DAILY PROGRESS NOTE   Patient Name: [name] Date of Encounter: 06/15/2022 Cardiologist: Minus Breeding, MD  Chief Complaint   No events overnight  Patient Profile   81 yo male with history of CAD, recurrent GIB, dementia, chronic subdural hematomas, and ESRD on HD, presents with afib with RVR and elevated troponin  Subjective   Converted to sinus rhythm - has maintained that overnight.   Objective   Vitals:   06/14/22 2045 06/15/22 0031 06/15/22 0500 06/15/22 0542  BP: (!) 145/59 (!) 148/93  113/73  Pulse: 60 77    Resp: '15 20  15  '$ Temp: 98 F (36.7 C) 97.6 F (36.4 C) (!) 97 F (36.1 C) (!) 97 F (36.1 C)  TempSrc: Oral Oral Oral Oral  SpO2: 99% 100%    Weight: 70.4 kg   70.4 kg  Height: '5\' 7"'$  (1.702 m)      No intake or output data in the 24 hours ending 06/15/22 0921 Filed Weights   06/14/22 1530 06/14/22 2045 06/15/22 0542  Weight: 70.3 kg 70.4 kg 70.4 kg    Physical Exam   General appearance: alert and no distress Lungs: clear to auscultation bilaterally Heart: regular rate and rhythm, S1, S2 normal, no murmur, click, rub or gallop Extremities: extremities normal, atraumatic, no cyanosis or edema Neurologic: Mental status: awake, but confused  Inpatient Medications    Scheduled Meds:  allopurinol  100 mg Oral Daily   heparin  5,000 Units Subcutaneous Q8H   insulin aspart  0-5 Units Subcutaneous QHS   insulin aspart  0-6 Units Subcutaneous TID WC   levothyroxine  50 mcg Oral QAC breakfast   midodrine  10 mg Oral Q M,W,F   pantoprazole  40 mg Oral Daily   QUEtiapine  25 mg Oral QHS   repaglinide  0.5 mg Oral Q M,W,F-1800   rosuvastatin  10 mg Oral Daily   sevelamer carbonate  2,400 mg Oral TID WC    Continuous Infusions:   PRN Meds: albuterol, sevelamer carbonate   Labs   Results for orders placed or performed during the hospital encounter of 06/14/22 (from the past 48 hour(s))  Comprehensive metabolic panel     Status: Abnormal    Collection Time: 06/14/22  1:50 PM  Result Value Ref Range   Sodium 139 135 - 145 mmol/L   Potassium 4.1 3.5 - 5.1 mmol/L   Chloride 95 (L) 98 - 111 mmol/L   CO2 32 22 - 32 mmol/L   Glucose, Bld 109 (H) 70 - 99 mg/dL    Comment: Glucose reference range applies only to samples taken after fasting for at least 8 hours.   BUN 19 8 - 23 mg/dL   Creatinine, Ser 5.63 (H) 0.61 - 1.24 mg/dL   Calcium 9.2 8.9 - 10.3 mg/dL   Total Protein 5.9 (L) 6.5 - 8.1 g/dL   Albumin 2.9 (L) 3.5 - 5.0 g/dL   AST 21 15 - 41 U/L   ALT 10 0 - 44 U/L   Alkaline Phosphatase 115 38 - 126 U/L   Total Bilirubin 0.2 (L) 0.3 - 1.2 mg/dL   GFR, Estimated 10 (L) >60 mL/min    Comment: (NOTE) Calculated using the CKD-EPI Creatinine Equation (2021)    Anion gap 12 5 - 15    Comment: Performed at [facility], [place] [address] [place], [place] [address]  Troponin I (High Sensitivity)     Status: Abnormal   Collection Time: 06/14/22  1:50 PM  Result Value Ref Range   Troponin I (High Sensitivity) 136 (HH) <18 ng/L    Comment: CRITICAL VALUE NOTED. VALUE IS CONSISTENT WITH PREVIOUSLY REPORTED/CALLED VALUE (NOTE) Elevated high sensitivity troponin I (hsTnI) values and significant  changes across serial measurements may suggest ACS but many other  chronic and acute conditions are known to elevate hsTnI results.  Refer to the "Links" section for chest pain algorithms and additional  guidance. Performed at [facility], [place] [address] [place], [place] [address]   Lipase, blood     Status: None   Collection Time: 06/14/22  1:50 PM  Result Value Ref Range   Lipase 39 11 - 51 U/L    Comment: Performed at [facility] [address] [place], [place] [address]  CBC with Differential     Status: Abnormal   Collection Time: 06/14/22  1:50 PM  Result Value Ref Range   WBC 7.3 4.0 - 10.5 K/uL   RBC 2.67 (L) 4.22 - 5.81 MIL/uL   Hemoglobin 8.8 (L) 13.0 - 17.0 g/dL   HCT 28.0 (L) 39.0 - 52.0 %   MCV  104.9 (H) 80.0 - 100.0 fL   MCH 33.0 26.0 - 34.0 pg   MCHC 31.4 30.0 - 36.0 g/dL   RDW 17.3 (H) 11.5 - 15.5 %   Platelets 213 150 - 400 K/uL   nRBC 0.8 (H) 0.0 - 0.2 %   Neutrophils Relative % 67 %   Neutro Abs 4.9 1.7 - 7.7 K/uL   Lymphocytes Relative 20 %   Lymphs Abs 1.5 0.7 - 4.0 K/uL   Monocytes Relative 9 %   Monocytes Absolute 0.6 0.1 - 1.0 K/uL   Eosinophils Relative 3 %   Eosinophils Absolute 0.2 0.0 - 0.5 K/uL   Basophils Relative 1 %   Basophils Absolute 0.1 0.0 - 0.1 K/uL   Immature Granulocytes 0 %   Abs Immature Granulocytes 0.02 0.00 - 0.07 K/uL    Comment: Performed at [facility], [place] [address] [place], [place] [address]  Troponin I (High Sensitivity)     Status: Abnormal   Collection Time: 06/14/22  2:40 PM  Result Value Ref Range   Troponin I (High Sensitivity) 303 (HH) <18 ng/L    Comment: CRITICAL VALUE NOTED. VALUE IS CONSISTENT WITH PREVIOUSLY REPORTED/CALLED VALUE (NOTE) Elevated high sensitivity troponin I (hsTnI) values and significant  changes across serial measurements may suggest ACS but many other  chronic and acute conditions are known to elevate hsTnI results.  Refer to the "Links" section for chest pain algorithms and additional  guidance. Performed at [facility], [place] [address], [place], [place] [address]   CBG monitoring, ED     Status: None   Collection Time: 06/14/22  4:15 PM  Result Value Ref Range   Glucose-Capillary 98 70 - 99 mg/dL    Comment: Glucose reference range applies only to samples taken after fasting for at least 8 hours.  Troponin I (High Sensitivity)     Status: Abnormal   Collection Time: 06/14/22  7:31 PM  Result Value Ref Range   Troponin I (High Sensitivity) 840 (HH) <18 ng/L    Comment: CRITICAL VALUE NOTED. VALUE IS CONSISTENT WITH PREVIOUSLY REPORTED/CALLED VALUE (NOTE) Elevated high sensitivity troponin I (hsTnI) values and significant  changes across serial measurements may suggest ACS but many  other  chronic and acute conditions are known to elevate hsTnI results.  Refer to the "  Links" section for chest pain algorithms and additional  guidance. Performed at [facility], [place] [address], [place], [place] [address]   Creatinine, serum     Status: Abnormal   Collection Time: 06/14/22  7:31 PM  Result Value Ref Range   Creatinine, Ser 6.20 (H) 0.61 - 1.24 mg/dL   GFR, Estimated 9 (L) >60 mL/min    Comment: (NOTE) Calculated using the CKD-EPI Creatinine Equation (2021) Performed at [facility] [address] [place], [place] [address]   Troponin I (High Sensitivity)     Status: Abnormal   Collection Time: 06/14/22  9:11 PM  Result Value Ref Range   Troponin I (High Sensitivity) 927 (HH) <18 ng/L    Comment: CRITICAL VALUE NOTED.  VALUE IS CONSISTENT WITH PREVIOUSLY REPORTED AND CALLED VALUE. (NOTE) Elevated high sensitivity troponin I (hsTnI) values and significant  changes across serial measurements may suggest ACS but many other  chronic and acute conditions are known to elevate hsTnI results.  Refer to the "Links" section for chest pain algorithms and additional  guidance. Performed at [facility], [place] [address] [place], [place] [address]   Glucose, capillary     Status: Abnormal   Collection Time: 06/14/22 10:17 PM  Result Value Ref Range   Glucose-Capillary 135 (H) 70 - 99 mg/dL    Comment: Glucose reference range applies only to samples taken after fasting for at least 8 hours.  CBC     Status: Abnormal   Collection Time: 06/15/22  4:51 AM  Result Value Ref Range   WBC 4.4 4.0 - 10.5 K/uL   RBC 2.55 (L) 4.22 - 5.81 MIL/uL   Hemoglobin 8.3 (L) 13.0 - 17.0 g/dL   HCT 26.1 (L) 39.0 - 52.0 %   MCV 102.4 (H) 80.0 - 100.0 fL   MCH 32.5 26.0 - 34.0 pg   MCHC 31.8 30.0 - 36.0 g/dL   RDW 17.3 (H) 11.5 - 15.5 %   Platelets 193 150 - 400 K/uL   nRBC 0.7 (H) 0.0 - 0.2 %    Comment: Performed at [facility] [address]  [place], [place] [address]  Comprehensive metabolic panel     Status: Abnormal   Collection Time: 06/15/22  4:51 AM  Result Value Ref Range   Sodium 141 135 - 145 mmol/L   Potassium 3.9 3.5 - 5.1 mmol/L   Chloride 95 (L) 98 - 111 mmol/L   CO2 32 22 - 32 mmol/L   Glucose, Bld 89 70 - 99 mg/dL    Comment: Glucose reference range applies only to samples taken after fasting for at least 8 hours.   BUN 24 (H) 8 - 23 mg/dL   Creatinine, Ser 6.87 (H) 0.61 - 1.24 mg/dL   Calcium 9.0 8.9 - 10.3 mg/dL   Total Protein 5.4 (L) 6.5 - 8.1 g/dL   Albumin 2.7 (L) 3.5 - 5.0 g/dL   AST 19 15 - 41 U/L   ALT 9 0 - 44 U/L   Alkaline Phosphatase 105 38 - 126 U/L   Total Bilirubin 0.3 0.3 - 1.2 mg/dL   GFR, Estimated 8 (L) >60 mL/min    Comment: (NOTE) Calculated using the CKD-EPI Creatinine Equation (2021)    Anion gap 14 5 - 15    Comment: Performed at [facility], [place] [address], [place], [place] [address]  Magnesium     Status: None   Collection Time: 06/15/22  4:51  AM  Result Value Ref Range   Magnesium 2.2 1.7 - 2.4 mg/dL    Comment: Performed at [facility], [place] [address], [place], [place] [address]  Phosphorus     Status: Abnormal   Collection Time: 06/15/22  4:51 AM  Result Value Ref Range   Phosphorus 4.7 (H) 2.5 - 4.6 mg/dL    Comment: Performed at [facility] [address], [place], [place] [address]  Glucose, capillary     Status: None   Collection Time: 06/15/22  7:56 AM  Result Value Ref Range   Glucose-Capillary 80 70 - 99 mg/dL    Comment: Glucose reference range applies only to samples taken after fasting for at least 8 hours.   Comment 1 Notify RN    Comment 2 Document in Chart    *Note: Due to a large number of results and/or encounters for the requested time period, some results have not been displayed. A complete set of results can be found in Results Review.    ECG   N/A  Telemetry   Sinus bradycardia - Personally Reviewed  Radiology    CT  ABDOMEN PELVIS WO CONTRAST  Result Date: 06/14/2022 CLINICAL DATA:  Left lower quadrant abdominal pain EXAM: CT ABDOMEN AND PELVIS WITHOUT CONTRAST TECHNIQUE: Multidetector CT imaging of the abdomen and pelvis was performed following the standard protocol without IV contrast. RADIATION DOSE REDUCTION: This exam was performed according to the departmental dose-optimization program which includes automated exposure control, adjustment of the mA and/or kV according to patient size and/or use of iterative reconstruction technique. COMPARISON:  06/06/2022 FINDINGS: Lower chest: Trace left pleural effusion with mild dependent atelectasis. Heart size is normal. Aortic and coronary artery atherosclerosis. Right-sided gynecomastia. Hepatobiliary: Stable subcentimeter low-density lesions within the liver, too small to characterize. Liver otherwise within normal limits. Unremarkable gallbladder. No hyperdense gallstone. No biliary dilatation. Pancreas: Unremarkable. No pancreatic ductal dilatation or surrounding inflammatory changes. Spleen: Normal in size without focal abnormality. Adrenals/Urinary Tract: Unremarkable adrenal glands. Bilateral renal atrophy. Numerous bilateral renal cysts, stable, and no dedicated imaging follow-up is recommended. No renal stone or hydronephrosis. Urinary bladder is decompressed. Stomach/Bowel: Stomach is within normal limits. Scattered colonic diverticulosis. No evidence of bowel wall thickening, distention, or inflammatory changes. Vascular/Lymphatic: Advanced atherosclerotic calcifications throughout the aortoiliac axis. Prior aorto bi-iliac stent graft, unable to be assessed on noncontrast imaging. No abdominopelvic lymphadenopathy. Reproductive: Prostate is unremarkable. Other: No free fluid. No abdominopelvic fluid collection. No pneumoperitoneum. Postsurgical changes to the anterior abdominal wall. Musculoskeletal: No interval change of lumbar vertebral body compression fractures  involving L1, L2, L3, L4, and L5. Prior right hip ORIF. No new or acute bony findings. IMPRESSION: 1. No acute abdominopelvic findings. 2. Trace left pleural effusion with mild dependent atelectasis. 3. Colonic diverticulosis without evidence of acute diverticulitis. 4. Aortic and coronary artery atherosclerosis (ICD10-I70.0). Electronically Signed   By: Davina Poke D.O.   On: 06/14/2022 14:38   DG Chest 2 View  Result Date: 06/14/2022 CLINICAL DATA:  Chest pain. EXAM: CHEST - 2 VIEW COMPARISON:  CT chest dated June 06, 2022. Chest x-ray dated October 01, 2021. FINDINGS: The heart size and mediastinal contours are within normal limits. Normal pulmonary vascularity. Unchanged small left pleural effusion with adjacent left lower lobe opacity. The right lung is clear. No pneumothorax. No acute osseous abnormality. IMPRESSION: 1. Unchanged small left pleural effusion with adjacent left lower lobe atelectasis versus infiltrate. Electronically Signed   By: Orville Govern.D.  On: 06/14/2022 14:08    Cardiac Studies   N/A  Assessment   Principal Problem:   Chest pain Active Problems:   Paroxysmal atrial fibrillation (HCC)   Prolonged QT interval   Plan   He has converted back to sinus bradycardia - stable overnight. Not an anticoagulation candidate d/t recurrent GIB and subdural hematomas. Was on amiodarone however, held d/t QT prolongation. Monitor QTc off medication and avoid QT prolonging agents. Not an ablation candidate and not a candidate for further invasive work-up of CAD. Given his bradycardia, not likely to tolerate AVN blockers.   Time Spent Directly with Patient:  I have spent a total of 25 minutes with the patient reviewing hospital notes, telemetry, EKGs, labs and examining the patient as well as establishing an assessment and plan that was discussed personally with the patient.  > 50% of time was spent in direct patient care.  Length of Stay:  LOS: 1 day   Pixie Casino, MD, [facility], [place] Director of the [facility]  [facility] Diplomate of the American Board of Clinical Lipidology Attending Cardiologist  Direct Dial: [phone]  Fax: [phone]  Website:  [email or web address] Joanette Silveria 06/15/2022, 9:21 AM

## 2022-06-16 DIAGNOSIS — I5042 Chronic combined systolic (congestive) and diastolic (congestive) heart failure: Secondary | ICD-10-CM | POA: Diagnosis present

## 2022-06-16 DIAGNOSIS — R1032 Left lower quadrant pain: Secondary | ICD-10-CM

## 2022-06-16 DIAGNOSIS — R0789 Other chest pain: Secondary | ICD-10-CM

## 2022-06-16 DIAGNOSIS — D638 Anemia in other chronic diseases classified elsewhere: Secondary | ICD-10-CM | POA: Diagnosis not present

## 2022-06-16 DIAGNOSIS — E1165 Type 2 diabetes mellitus with hyperglycemia: Secondary | ICD-10-CM | POA: Diagnosis not present

## 2022-06-16 DIAGNOSIS — I10 Essential (primary) hypertension: Secondary | ICD-10-CM

## 2022-06-16 DIAGNOSIS — N186 End stage renal disease: Secondary | ICD-10-CM

## 2022-06-16 DIAGNOSIS — F0390 Unspecified dementia without behavioral disturbance: Secondary | ICD-10-CM

## 2022-06-16 DIAGNOSIS — Z992 Dependence on renal dialysis: Secondary | ICD-10-CM

## 2022-06-16 DIAGNOSIS — G8929 Other chronic pain: Secondary | ICD-10-CM | POA: Diagnosis present

## 2022-06-16 LAB — CBC WITH DIFFERENTIAL/PLATELET
Abs Immature Granulocytes: 0.01 10*3/uL (ref 0.00–0.07)
Basophils Absolute: 0.1 10*3/uL (ref 0.0–0.1)
Basophils Relative: 1 %
Eosinophils Absolute: 0.3 10*3/uL (ref 0.0–0.5)
Eosinophils Relative: 5 %
HCT: 25.6 % — ABNORMAL LOW (ref 39.0–52.0)
Hemoglobin: 8.3 g/dL — ABNORMAL LOW (ref 13.0–17.0)
Immature Granulocytes: 0 %
Lymphocytes Relative: 40 %
Lymphs Abs: 2.2 10*3/uL (ref 0.7–4.0)
MCH: 32.9 pg (ref 26.0–34.0)
MCHC: 32.4 g/dL (ref 30.0–36.0)
MCV: 101.6 fL — ABNORMAL HIGH (ref 80.0–100.0)
Monocytes Absolute: 0.4 10*3/uL (ref 0.1–1.0)
Monocytes Relative: 8 %
Neutro Abs: 2.4 10*3/uL (ref 1.7–7.7)
Neutrophils Relative %: 46 %
Platelets: 199 10*3/uL (ref 150–400)
RBC: 2.52 MIL/uL — ABNORMAL LOW (ref 4.22–5.81)
RDW: 17.2 % — ABNORMAL HIGH (ref 11.5–15.5)
WBC: 5.3 10*3/uL (ref 4.0–10.5)
nRBC: 0.6 % — ABNORMAL HIGH (ref 0.0–0.2)

## 2022-06-16 LAB — COMPREHENSIVE METABOLIC PANEL
ALT: 8 U/L (ref 0–44)
AST: 17 U/L (ref 15–41)
Albumin: 2.8 g/dL — ABNORMAL LOW (ref 3.5–5.0)
Alkaline Phosphatase: 110 U/L (ref 38–126)
Anion gap: 9 (ref 5–15)
BUN: 28 mg/dL — ABNORMAL HIGH (ref 8–23)
CO2: 31 mmol/L (ref 22–32)
Calcium: 8.3 mg/dL — ABNORMAL LOW (ref 8.9–10.3)
Chloride: 101 mmol/L (ref 98–111)
Creatinine, Ser: 6.91 mg/dL — ABNORMAL HIGH (ref 0.61–1.24)
GFR, Estimated: 7 mL/min — ABNORMAL LOW (ref >60)
Glucose, Bld: 86 mg/dL (ref 70–99)
Potassium: 3.8 mmol/L (ref 3.5–5.1)
Sodium: 141 mmol/L (ref 135–145)
Total Bilirubin: 0.4 mg/dL (ref 0.3–1.2)
Total Protein: 5.6 g/dL — ABNORMAL LOW (ref 6.5–8.1)

## 2022-06-16 LAB — MAGNESIUM: Magnesium: 2.2 mg/dL (ref 1.7–2.4)

## 2022-06-16 LAB — GLUCOSE, CAPILLARY
Glucose-Capillary: 77 mg/dL (ref 70–99)
Glucose-Capillary: 100 mg/dL — ABNORMAL HIGH (ref 70–99)
Glucose-Capillary: 144 mg/dL — ABNORMAL HIGH (ref 70–99)

## 2022-06-16 LAB — PHOSPHORUS: Phosphorus: 3.3 mg/dL (ref 2.5–4.6)

## 2022-06-16 LAB — HEPATITIS B SURFACE ANTIBODY,QUALITATIVE: Hep B S Ab: REACTIVE — AB

## 2022-06-16 LAB — HEPATITIS B SURFACE ANTIGEN: Hepatitis B Surface Ag: NONREACTIVE

## 2022-06-16 LAB — HEPATITIS B CORE ANTIBODY, TOTAL: Hep B Core Total Ab: NONREACTIVE

## 2022-06-16 LAB — HEPATITIS C ANTIBODY: HCV Ab: NONREACTIVE

## 2022-06-16 MED ORDER — HEPARIN SODIUM (PORCINE) 1000 UNIT/ML DIALYSIS
1000.0000 [IU] | INTRAMUSCULAR | Status: DC | PRN
  Filled 2022-06-16: qty 1

## 2022-06-16 MED ORDER — DOXERCALCIFEROL 4 MCG/2ML IV SOLN: 5.0000 ug | INTRAVENOUS | 0 refills | Status: AC

## 2022-06-16 MED ORDER — ANTICOAGULANT SODIUM CITRATE 4% (200MG/5ML) IV SOLN: 5.0000 mL | Status: DC | PRN

## 2022-06-16 MED ORDER — LIDOCAINE HCL (PF) 1 % IJ SOLN: 5.0000 mL | INTRAMUSCULAR | Status: DC | PRN

## 2022-06-16 MED ORDER — LIDOCAINE-PRILOCAINE 2.5-2.5 % EX CREA: 1.0000 | TOPICAL_CREAM | CUTANEOUS | Status: DC | PRN

## 2022-06-16 MED ORDER — ALTEPLASE 2 MG IJ SOLR: 2.0000 mg | Freq: Once | INTRAMUSCULAR | Status: DC | PRN

## 2022-06-16 MED ORDER — PENTAFLUOROPROP-TETRAFLUOROETH EX AERO: 1.0000 | INHALATION_SPRAY | CUTANEOUS | Status: DC | PRN

## 2022-06-16 NOTE — Progress Notes (Signed)
[facility] Progress Note   Subjective:   Seen on HD, calm and pleasant this AM. Denies SOB, CP, dizziness and nausea. No concerns reported.   Objective Vitals:   06/16/22 0707 06/16/22 0714 06/16/22 0730 06/16/22 0800  BP:  (!) 160/57 (!) 167/60 (!) 159/54  Pulse:  63 61 (!) 57  Resp: '15 16 15 14  '$ Temp:  97.6 F (36.4 C)    TempSrc:  Oral    SpO2:  97% 98% 97%  Weight:  68 kg    Height:       Physical Exam General: Elderly male, alert and in NAD Heart: RRR, no murmurs, rubs or gallops Lungs: CTA bilaterally, respirations unlabored on RA Abdomen: soft, non-distended, +BS Extremities: no edema b/l lower extremities Dialysis Access:  AVF accessed  Additional Objective Labs: Basic Metabolic Panel: Recent Labs  Lab 06/10/22 0123 06/14/22 1350 06/14/22 1931 06/15/22 0451  NA 135 139  --  141  K 4.2 4.1  --  3.9  CL 94* 95*  --  95*  CO2 30 32  --  32  GLUCOSE 112* 109*  --  89  BUN 29* 19  --  24*  CREATININE 7.23* 5.63* 6.20* 6.87*  CALCIUM 8.8* 9.2  --  9.0  PHOS 3.6  --   --  4.7*   Liver Function Tests: Recent Labs  Lab 06/10/22 0123 06/14/22 1350 06/15/22 0451  AST  --  21 19  ALT  --  10 9  ALKPHOS  --  115 105  BILITOT  --  0.2* 0.3  PROT  --  5.9* 5.4*  ALBUMIN 2.7* 2.9* 2.7*   Recent Labs  Lab 06/14/22 1350  LIPASE 39   CBC: Recent Labs  Lab 06/10/22 0123 06/14/22 1350 06/15/22 0451 06/16/22 0736  WBC 7.0 7.3 4.4 5.3  NEUTROABS  --  4.9  --  2.4  HGB 8.0* 8.8* 8.3* 8.3*  HCT 24.6* 28.0* 26.1* 25.6*  MCV 100.8* 104.9* 102.4* 101.6*  PLT 181 213 193 199   Blood Culture    Component Value Date/Time   SDES URINE, CLEAN CATCH 04/09/2022 2051   SPECREQUEST  04/09/2022 2051    NONE Performed at [facility], [address], [place], [place] [address]    CULT MULTIPLE SPECIES PRESENT, SUGGEST RECOLLECTION (A) 04/09/2022 2051   REPTSTATUS 04/11/2022 FINAL 04/09/2022 2051    Cardiac Enzymes: No results for  input(s): "CKTOTAL", "CKMB", "CKMBINDEX", "TROPONINI" in the last 168 hours. CBG: Recent Labs  Lab 06/14/22 2217 06/15/22 0756 06/15/22 1150 06/15/22 1628 06/15/22 2116  GLUCAP 135* 80 88 151* 81   Iron Studies: No results for input(s): "IRON", "TIBC", "TRANSFERRIN", "FERRITIN" in the last 72 hours. '@lablastinr3'$ @ Studies/Results: CT ABDOMEN PELVIS WO CONTRAST  Result Date: 06/14/2022 CLINICAL DATA:  Left lower quadrant abdominal pain EXAM: CT ABDOMEN AND PELVIS WITHOUT CONTRAST TECHNIQUE: Multidetector CT imaging of the abdomen and pelvis was performed following the standard protocol without IV contrast. RADIATION DOSE REDUCTION: This exam was performed according to the departmental dose-optimization program which includes automated exposure control, adjustment of the mA and/or kV according to patient size and/or use of iterative reconstruction technique. COMPARISON:  06/06/2022 FINDINGS: Lower chest: Trace left pleural effusion with mild dependent atelectasis. Heart size is normal. Aortic and coronary artery atherosclerosis. Right-sided gynecomastia. Hepatobiliary: Stable subcentimeter low-density lesions within the liver, too small to characterize. Liver otherwise within normal limits. Unremarkable gallbladder. No hyperdense gallstone. No biliary dilatation. Pancreas: Unremarkable. No pancreatic ductal  dilatation or surrounding inflammatory changes. Spleen: Normal in size without focal abnormality. Adrenals/Urinary Tract: Unremarkable adrenal glands. Bilateral renal atrophy. Numerous bilateral renal cysts, stable, and no dedicated imaging follow-up is recommended. No renal stone or hydronephrosis. Urinary bladder is decompressed. Stomach/Bowel: Stomach is within normal limits. Scattered colonic diverticulosis. No evidence of bowel wall thickening, distention, or inflammatory changes. Vascular/Lymphatic: Advanced atherosclerotic calcifications throughout the aortoiliac axis. Prior aorto bi-iliac  stent graft, unable to be assessed on noncontrast imaging. No abdominopelvic lymphadenopathy. Reproductive: Prostate is unremarkable. Other: No free fluid. No abdominopelvic fluid collection. No pneumoperitoneum. Postsurgical changes to the anterior abdominal wall. Musculoskeletal: No interval change of lumbar vertebral body compression fractures involving L1, L2, L3, L4, and L5. Prior right hip ORIF. No new or acute bony findings. IMPRESSION: 1. No acute abdominopelvic findings. 2. Trace left pleural effusion with mild dependent atelectasis. 3. Colonic diverticulosis without evidence of acute diverticulitis. 4. Aortic and coronary artery atherosclerosis (ICD10-I70.0). Electronically Signed   By: Davina Poke D.O.   On: 06/14/2022 14:38   DG Chest 2 View  Result Date: 06/14/2022 CLINICAL DATA:  Chest pain. EXAM: CHEST - 2 VIEW COMPARISON:  CT chest dated June 06, 2022. Chest x-ray dated October 01, 2021. FINDINGS: The heart size and mediastinal contours are within normal limits. Normal pulmonary vascularity. Unchanged small left pleural effusion with adjacent left lower lobe opacity. The right lung is clear. No pneumothorax. No acute osseous abnormality. IMPRESSION: 1. Unchanged small left pleural effusion with adjacent left lower lobe atelectasis versus infiltrate. Electronically Signed   By: Titus Dubin M.D.   On: 06/14/2022 14:08   Medications:  anticoagulant sodium citrate      allopurinol  100 mg Oral Daily   Chlorhexidine Gluconate Cloth  6 each Topical Q0600   doxercalciferol  5 mcg Intravenous Q T,Th,Sa-HD   heparin  5,000 Units Subcutaneous Q8H   insulin aspart  0-5 Units Subcutaneous QHS   insulin aspart  0-6 Units Subcutaneous TID WC   levothyroxine  50 mcg Oral QAC breakfast   midodrine  10 mg Oral Q M,W,F   pantoprazole  40 mg Oral Daily   QUEtiapine  25 mg Oral QHS   repaglinide  0.5 mg Oral Q M,W,F-1800   rosuvastatin  10 mg Oral Daily   sevelamer carbonate  2,400 mg  Oral TID WC    Dialysis Orders: Center: [facility]  on MWF. 180NRe 3 hr 45 min BFR 400 DFR Auto 1.5 EDW 70.5kg 2K 2Ca AVF 15g no heparin Mircera 187mg IV q 2 weeks- last dose 1234m on 9/18 Hectorol 95m58mIV q HD    Assessment/Plan: Chest pain: in a. Fib RVR on arrival but now in sinus rhythm. Conservative treatment recommended, management per cardiology  ESRD:  MWF schedule, no volume overload on exam. He preferred to have HD today instead of yesterday. Next HD Monday  Hypertension/volume: BP controlled, no volume overload on exam and is close to his EDW. Takes midodrine '10mg'$  PO pre HD, will continue here.   Anemia: Hb 8.3, hx of recurrent GI bleed but no active bleeding reported. No heparin with HD. Continue ESA.   Metabolic bone disease: Corrected calcium elevated, will reduce hectorol dose. Phos at goal  Nutrition:  Renal diet/fluid restriction  SamAnice PaganiniA-C 06/16/2022, 8:30 AM  [facility] Pager: [phone]

## 2022-06-16 NOTE — Progress Notes (Signed)
Amiodarone has been discontinued. Will check repeat EKG today to monitor Qtc. Not a candidate for invasive evaluation of his chest pain/troponin elevation.  Donato Heinz, MD

## 2022-06-16 NOTE — Discharge Summary (Signed)
Physician Discharge Summary  [name] DQQ:[id number] DOB: [birth year] DOA: 06/14/2022  PCP: Elby Showers, MD  Admit date: 06/14/2022 Discharge date: 06/16/2022  Time spent: 35 minutes  Recommendations for Outpatient Follow-up:   Chronic systolic and diastolic CHF, pulmonary HTN -9/14 EF 45 to 50%     A-fib with RVR - Per cardiology note 9/21 they suspect elevated troponin secondary to demand ischemia not a candidate for invasive work-up given his dementia and anemia - 9/23 NSR   Atypical chest pain, rule out ACS Full dose aspirin 325 mg x 1 Resume home Crestor Left chest pain radiating to left lower abdomen Presented with A-fib with RVR Troponin elevated greater than 300, no evidence of acute ischemia on 12-lead EKG, suspected demand ischemia in the setting of A-fib with RVR. Cardiology following, appreciate assistance Recent 2D echo on 06/07/2022 which revealed LVEF 45 to 50% with left ventricle global hypokinesis. Monitor on telemetry   Chronic hypotension Resume home midodrine regimen Maintain MAP greater than 65 Continue to monitor vital signs      Prolonged Qtc Admission twelve-lead EKG, QTc 575. Avoid QTc prolonging agents Electrolytes managed with hemodialysis   Anemia of chronic disease/Hx GI bleed/gastric small bowel AVMs GERD Hemoglobin appears to be at baseline 8.8 No overt bleeding reported Resume home PPI -Transfuse for hemoglobin<7 Lab Results  Component Value Date   HGB 8.3 (L) 06/16/2022   HGB 8.3 (L) 06/15/2022   HGB 8.8 (L) 06/14/2022   HGB 8.0 (L) 06/10/2022   HGB 8.2 (L) 06/09/2022    ESRD on HD M/W/F -Last HD on 9/23 - Resume home HD schedule     Chronic left lower quadrant abdominal pain CT abdomen and pelvis without contrast was unrevealing. Consider GI consult during this admission   Dementia Resume home regimen Reorient as needed   DM type II controlled with Hyperglycemia -8/24 hemoglobin A1c = 6.5 CBG (last 3)  Recent  Labs    06/15/22 2116 06/16/22 1251 06/16/22 1625  GLUCAP 81 77 100*  - Restart home medications  Hypothyroidism -Resume home levothyroxine   Gout -Resume home allopurinol    Discharge Diagnoses:  Principal Problem:   Chest pain Active Problems:   Essential hypertension   Dementia without behavioral disturbance (HCC)   Paroxysmal atrial fibrillation (HCC)   Prolonged QT interval   Systolic and diastolic CHF, chronic (HCC)   Atypical chest pain   Anemia of chronic disease   End-stage renal disease on hemodialysis (HCC)   Abdominal pain, chronic, left lower quadrant   Controlled type 2 diabetes mellitus with hyperglycemia ([place])   Discharge Condition: Stable  Diet recommendation: Heart healthy/carb modified  Filed Weights   06/16/22 0427 06/16/22 0714 06/16/22 1117  Weight: 70.6 kg 68 kg 66.9 kg    History of present illness:  81 y.o. BM PMHx Dementia, ESRD on HD M/W/F, CAD, Hx AAA repair, chronic hypotension on Midodrine, SDH evacuation, DM type II, HLD, , anemia of chronic disease, Hx recurrent GI bleed, gastric and small bowel AVMs, GERD, gout, chronic abdominal pain,    presented to [facility] ED due to left-sided nonexertional chest pain, radiating to his left abdomen.  Recently admitted and discharged 4 days ago with a similar presentation.    Onset of chest pain earlier this morning, during his occupational therapist visit.  Associated with one episode of nausea and vomiting nonbloody nonbilious emesis.  EMS was activated.  The patient was noted to be in A-fib with RVR.  To note, the patient  has had a recent referral to GI but unfortunately this has not been completed.     In the ED, work-up revealed elevated high-sensitivity troponin, peaked at 303.  Seen by cardiology, recommended conservative management, no heparin drip, likely demand ischemia in the setting of A-fib with RVR.  Spontaneously converted back to sinus rhythm.  TRH, hospitalist service, was asked to  admit.   ED Course: Tmax 98.1.  BP 130/44, pulse 61, respiration rate 19, O2 saturation 95% on room air.  Lab studies remarkable for hemoglobin 8.8, at baseline, MCV 104, WBC 7.3, platelet 213.  Hospital Course:  See above  Procedures: 9/14 Echocardiogram  Left Ventricle: LV apical false tendon (normal variant). LVEF= 45 to 50%. The left ventricle has mildly decreased function. The left ventricle demonstrates global hypokinesis.  -Grade I diastolic dysfunction Right Ventricle: The right ventricular size is normal. No increase in  right ventricular wall thickness. Right ventricular systolic function is  normal. There is mildly elevated pulmonary artery systolic pressure. The  tricuspid regurgitant velocity is 2.52   m/s, and with an assumed right atrial pressure of 15 mmHg, the estimated  right ventricular systolic pressure is 90.3 mmHg.  Mitral Valve: Mild to moderate mitral valve regurgitation.   Tricuspid Valve:regurgitation is mild to moderate.    Discharge Exam: Vitals:   06/16/22 1030 06/16/22 1105 06/16/22 1117 06/16/22 1206  BP: (!) 160/51 (!) 165/56  (!) 142/50  Pulse: (!) 59 61  63  Resp: 15 15  16   Temp:  97.8 F (36.6 C)  98 F (36.7 C)  TempSrc:  Oral  Oral  SpO2: 100% 100%  97%  Weight:   66.9 kg   Height:        General: A/O x1 (does not know where, when, why) No acute respiratory distress Lungs: Clear to auscultation bilaterally without wheezes or crackles Cardiovascular: Regular rate and rhythm without murmur gallop or rub normal S1 and S2  Discharge Instructions   Allergies as of 06/16/2022       Reactions   Ambien [zolpidem Tartrate] Other (See Comments)   Hallucinations and "felt crazy"    Penicillins Hives, Rash   Has patient had a PCN reaction causing immediate rash, facial/tongue/throat swelling, SOB or lightheadedness with hypotension: Yes Has patient had a PCN reaction causing severe rash involving mucus membranes or skin necrosis: Yes Has  patient had a PCN reaction that required hospitalization: No Has patient had a PCN reaction occurring within the last 10 years: No If all of the above answers are "NO", then may proceed with Cephalosporin use.        Medication List     STOP taking these medications    amiodarone 200 MG tablet Commonly known as: PACERONE       TAKE these medications    acetaminophen 325 MG tablet Commonly known as: TYLENOL Take 1-2 tablets (325-650 mg total) by mouth every 4 (four) hours as needed for mild pain. What changed:  how much to take when to take this   albuterol 108 (90 Base) MCG/ACT inhaler Commonly known as: VENTOLIN HFA Inhale 1-2 puffs into the lungs daily as needed for wheezing or shortness of breath.   allopurinol 100 MG tablet Commonly known as: ZYLOPRIM Take 100 mg by mouth daily.   camphor-menthol lotion Commonly known as: SARNA Apply 1 application topically 4 (four) times daily as needed for itching.   cetirizine 10 MG tablet Commonly known as: ZYRTEC Take 10 mg by mouth daily as needed  for allergies.   cyanocobalamin 1000 MCG/ML injection Commonly known as: VITAMIN B12 Inject 1 mL (1,000 mcg total) into the muscle every 30 (thirty) days.   donepezil 10 MG tablet Commonly known as: ARICEPT Take 1 tablet (10 mg total) by mouth at bedtime.   doxercalciferol 4 MCG/2ML injection Commonly known as: HECTOROL Inject 2.5 mLs (5 mcg total) into the vein every Monday, Wednesday, and Friday with hemodialysis. Start taking on: June 18, 2022   feeding supplement (NEPRO CARB STEADY) Liqd Take 237 mLs by mouth in the morning.   fluticasone 50 MCG/ACT nasal spray Commonly known as: FLONASE Place 1 spray into both nostrils daily. What changed:  how much to take when to take this reasons to take this   Forteo 600 MCG/2.4ML Sopn Generic drug: Teriparatide (Recombinant) INJECT 20 MCG UNDER THE SKIN DAILY (DISCARD 28 DAYS AFTER INITIAL USE) What changed: See  the new instructions.   FreeStyle Freedom Lite w/Device Kit Use to check blood sugar 2 times per day dx code E11.65   freestyle lancets Use as instructed to check blood sugar 2 times per day dx code E11.65   FREESTYLE LITE test strip Generic drug: glucose blood USE AS DIRECTED THREE TIMES DAILY   hydrocortisone 2.5 % cream Apply 1 application topically 3 (three) times daily as needed (skin irritation (legs & arms)).   levothyroxine 50 MCG tablet Commonly known as: SYNTHROID Take 1 tablet (50 mcg total) by mouth daily. What changed: when to take this   memantine 10 MG tablet Commonly known as: NAMENDA Take 1 tablet (10 mg total) by mouth 2 (two) times daily.   midodrine 10 MG tablet Commonly known as: PROAMATINE Take one pill prior to hemodialysis on MWF What changed:  how much to take how to take this when to take this additional instructions   omeprazole 40 MG capsule Commonly known as: PRILOSEC Take 1 capsule (40 mg total) by mouth daily. What changed: when to take this   QUEtiapine 25 MG tablet Commonly known as: SEROquel Take 1 tablet (25 mg total) by mouth at bedtime.   repaglinide 0.5 MG tablet Commonly known as: PRANDIN TAKE 1 TABLET(0.5 MG) BY MOUTH TWICE DAILY BEFORE A MEAL What changed: See the new instructions.   rosuvastatin 10 MG tablet Commonly known as: CRESTOR Take 1 tablet (10 mg total) by mouth daily.   sevelamer carbonate 800 MG tablet Commonly known as: RENVELA Take 1,600-2,400 mg by mouth See admin instructions. Take 2,400 mg by mouth three times a day with meals and 1,600 mg with snacks   SYRINGE 3CC/25GX1" 25G X 1" 3 ML Misc 1 each by Does not apply route every 30 (thirty) days.   Theratears 0.25 % Soln Generic drug: Carboxymethylcellulose Sodium Place 1 drop into both eyes 3 (three) times daily as needed (for dryness).   Tradjenta 5 MG Tabs tablet Generic drug: linagliptin Take 1 tablet (5 mg total) by mouth daily.        Allergies  Allergen Reactions   Ambien [Zolpidem Tartrate] Other (See Comments)    Hallucinations and "felt crazy"    Penicillins Hives and Rash    Has patient had a PCN reaction causing immediate rash, facial/tongue/throat swelling, SOB or lightheadedness with hypotension: Yes Has patient had a PCN reaction causing severe rash involving mucus membranes or skin necrosis: Yes Has patient had a PCN reaction that required hospitalization: No Has patient had a PCN reaction occurring within the last 10 years: No If all of the above  answers are "NO", then may proceed with Cephalosporin use.     Follow-up Information     Baxley, Cresenciano Lick, MD Follow up on 06/26/2022.   Specialty: Internal Medicine Why: 12 noon for hospital folllow up Contact information: [address] [place] [id number] [phone]         Minus Breeding, MD .   Specialty: Cardiology Contact information: [address] [place] [address] [place], [facility] Follow up.   Specialty: Home Health Services Contact information: [place] [address] [phone]                  The results of significant diagnostics from this hospitalization (including imaging, microbiology, ancillary and laboratory) are listed below for reference.    Significant Diagnostic Studies: CT ABDOMEN PELVIS WO CONTRAST  Result Date: 06/14/2022 CLINICAL DATA:  Left lower quadrant abdominal pain EXAM: CT ABDOMEN AND PELVIS WITHOUT CONTRAST TECHNIQUE: Multidetector CT imaging of the abdomen and pelvis was performed following the standard protocol without IV contrast. RADIATION DOSE REDUCTION: This exam was performed according to the departmental dose-optimization program which includes automated exposure control, adjustment of the mA and/or kV according to patient size and/or use of iterative reconstruction technique. COMPARISON:  06/06/2022 FINDINGS:  Lower chest: Trace left pleural effusion with mild dependent atelectasis. Heart size is normal. Aortic and coronary artery atherosclerosis. Right-sided gynecomastia. Hepatobiliary: Stable subcentimeter low-density lesions within the liver, too small to characterize. Liver otherwise within normal limits. Unremarkable gallbladder. No hyperdense gallstone. No biliary dilatation. Pancreas: Unremarkable. No pancreatic ductal dilatation or surrounding inflammatory changes. Spleen: Normal in size without focal abnormality. Adrenals/Urinary Tract: Unremarkable adrenal glands. Bilateral renal atrophy. Numerous bilateral renal cysts, stable, and no dedicated imaging follow-up is recommended. No renal stone or hydronephrosis. Urinary bladder is decompressed. Stomach/Bowel: Stomach is within normal limits. Scattered colonic diverticulosis. No evidence of bowel wall thickening, distention, or inflammatory changes. Vascular/Lymphatic: Advanced atherosclerotic calcifications throughout the aortoiliac axis. Prior aorto bi-iliac stent graft, unable to be assessed on noncontrast imaging. No abdominopelvic lymphadenopathy. Reproductive: Prostate is unremarkable. Other: No free fluid. No abdominopelvic fluid collection. No pneumoperitoneum. Postsurgical changes to the anterior abdominal wall. Musculoskeletal: No interval change of lumbar vertebral body compression fractures involving L1, L2, L3, L4, and L5. Prior right hip ORIF. No new or acute bony findings. IMPRESSION: 1. No acute abdominopelvic findings. 2. Trace left pleural effusion with mild dependent atelectasis. 3. Colonic diverticulosis without evidence of acute diverticulitis. 4. Aortic and coronary artery atherosclerosis (ICD10-I70.0). Electronically Signed   By: Davina Poke D.O.   On: 06/14/2022 14:38   DG Chest 2 View  Result Date: 06/14/2022 CLINICAL DATA:  Chest pain. EXAM: CHEST - 2 VIEW COMPARISON:  CT chest dated June 06, 2022. Chest x-ray dated October 01, 2021. FINDINGS: The heart size and mediastinal contours are within normal limits. Normal pulmonary vascularity. Unchanged small left pleural effusion with adjacent left lower lobe opacity. The right lung is clear. No pneumothorax. No acute osseous abnormality. IMPRESSION: 1. Unchanged small left pleural effusion with adjacent left lower lobe atelectasis versus infiltrate. Electronically Signed   By: Titus Dubin M.D.   On: 06/14/2022 14:08   ECHOCARDIOGRAM COMPLETE  Result Date: 06/07/2022    ECHOCARDIOGRAM REPORT   Patient Name:   [name] Date of Exam: 06/07/2022 Medical Rec #:  [id number]      Height:       67.0 in  Accession #:    [id number]     Weight:       156.6 lb Date of Birth:  December 22, 1940      BSA:          1.823 m Patient Age:    67 years       BP:           134/90 mmHg Patient Gender: M              HR:           52 bpm. Exam Location:  Inpatient Procedure: 2D Echo, Color Doppler and Cardiac Doppler Indications:    Chest pain/Elevated troponin  History:        Patient has prior history of Echocardiogram examinations, most                 recent 11/07/2020. CAD, Hx of AAA, Signs/Symptoms:Alzheimer's;                 Risk Factors:Diabetes, Dyslipidemia, Hypertension and Sleep                 Apnea.  Sonographer:    Eartha Inch Referring Phys: [id number] [place]  Sonographer Comments: Technically difficult study due to poor echo windows. Image acquisition challenging due to patient body habitus. Pt scanned supine. Could not be positioned in left lateral decubitus due to back pain. IMPRESSIONS  1. LV apical false tendon (normal variant). Left ventricular ejection fraction, by estimation, is 45 to 50%. The left ventricle has mildly decreased function. The left ventricle demonstrates global hypokinesis. There is mild left ventricular hypertrophy. Left ventricular diastolic parameters are consistent with Grade I diastolic dysfunction (impaired relaxation). Elevated left ventricular  end-diastolic pressure. The E/e' is 56.  2. Right ventricular systolic function is normal. The right ventricular size is normal. There is mildly elevated pulmonary artery systolic pressure. The estimated right ventricular systolic pressure is 41.6 mmHg.  3. The mitral valve is abnormal. Mild to moderate mitral valve regurgitation.  4. The tricuspid valve is abnormal. Tricuspid valve regurgitation is mild to moderate.  5. The aortic valve is tricuspid. Aortic valve regurgitation is not visualized.  6. The inferior vena cava is dilated in size with <50% respiratory variability, suggesting right atrial pressure of 15 mmHg. Comparison(s): Changes from prior study are noted. 11/07/2020: LVEF 55-60%, RVSP 40 mmHg, trivial MR. FINDINGS  Left Ventricle: LV apical false tendon (normal variant). Left ventricular ejection fraction, by estimation, is 45 to 50%. The left ventricle has mildly decreased function. The left ventricle demonstrates global hypokinesis. The left ventricular internal  cavity size was normal in size. There is mild left ventricular hypertrophy. Left ventricular diastolic parameters are consistent with Grade I diastolic dysfunction (impaired relaxation). Elevated left ventricular end-diastolic pressure. The E/e' is 90. Right Ventricle: The right ventricular size is normal. No increase in right ventricular wall thickness. Right ventricular systolic function is normal. There is mildly elevated pulmonary artery systolic pressure. The tricuspid regurgitant velocity is 2.52  m/s, and with an assumed right atrial pressure of 15 mmHg, the estimated right ventricular systolic pressure is 60.6 mmHg. Left Atrium: Left atrial size was normal in size. Right Atrium: Right atrial size was normal in size. Pericardium: There is no evidence of pericardial effusion. Mitral Valve: The mitral valve is abnormal. There is mild thickening of the anterior and posterior mitral valve leaflet(s). Mild mitral annular calcification.  Mild to moderate mitral valve regurgitation. Tricuspid Valve: The tricuspid valve is abnormal. Tricuspid  valve regurgitation is mild to moderate. Aortic Valve: The aortic valve is tricuspid. Aortic valve regurgitation is not visualized. Pulmonic Valve: The pulmonic valve was normal in structure. Pulmonic valve regurgitation is not visualized. Aorta: The aortic root and ascending aorta are structurally normal, with no evidence of dilitation. Venous: The inferior vena cava is dilated in size with less than 50% respiratory variability, suggesting right atrial pressure of 15 mmHg. IAS/Shunts: No atrial level shunt detected by color flow Doppler.  LEFT VENTRICLE PLAX 2D LVIDd:         3.60 cm     Diastology LVIDs:         3.20 cm     LV e' medial:    5.33 cm/s LV PW:         1.20 cm     LV E/e' medial:  21.2 LV IVS:        1.20 cm     LV e' lateral:   6.53 cm/s LVOT diam:     2.10 cm     LV E/e' lateral: 17.3 LV SV:         73 LV SV Index:   40 LVOT Area:     3.46 cm  LV Volumes (MOD) LV vol d, MOD A2C: 89.0 ml LV vol s, MOD A2C: 52.5 ml LV SV MOD A2C:     36.5 ml RIGHT VENTRICLE             IVC RV S prime:     14.50 cm/s  IVC diam: 2.10 cm TAPSE (M-mode): 2.6 cm LEFT ATRIUM             Index        RIGHT ATRIUM           Index LA diam:        3.60 cm 1.98 cm/m   RA Area:     17.80 cm LA Vol (A2C):   45.1 ml 24.75 ml/m  RA Volume:   45.10 ml  24.75 ml/m LA Vol (A4C):   49.8 ml 27.32 ml/m LA Biplane Vol: 49.7 ml 27.27 ml/m  AORTIC VALVE LVOT Vmax:   95.00 cm/s LVOT Vmean:  62.500 cm/s LVOT VTI:    0.211 m  AORTA Ao Root diam: 3.40 cm Ao Asc diam:  3.30 cm MITRAL VALVE                  TRICUSPID VALVE MV Area (PHT): 3.74 cm       TR Peak grad:   25.4 mmHg MV Decel Time: 203 msec       TR Mean grad:   16.0 mmHg MR Peak grad:    141.1 mmHg   TR Vmax:        252.00 cm/s MR Mean grad:    102.0 mmHg   TR Vmean:       193.0 cm/s MR Vmax:         594.00 cm/s MR Vmean:        475.0 cm/s   SHUNTS MR PISA:         1.57 cm      Systemic VTI:  0.21 m MR PISA Eff ROA: 10 mm       Systemic Diam: 2.10 cm MR PISA Radius:  0.50 cm MV E velocity: 113.00 cm/s MV A velocity: 126.00 cm/s MV E/A ratio:  0.90 Lyman Bishop MD Electronically signed by Lyman Bishop MD Signature Date/Time: 06/07/2022/4:00:16 PM    Final  CT Angio Chest PE W and/or Wo Contrast  Result Date: 06/06/2022 CLINICAL DATA:  Chest pain short of breath left upper quadrant abdominal pain EXAM: CT ANGIOGRAPHY CHEST, ABDOMEN AND PELVIS TECHNIQUE: Multidetector CT imaging through the chest, abdomen and pelvis was performed using the standard protocol during bolus administration of intravenous contrast. Multiplanar reconstructed images and MIPs were obtained and reviewed to evaluate the vascular anatomy. RADIATION DOSE REDUCTION: This exam was performed according to the departmental dose-optimization program which includes automated exposure control, adjustment of the mA and/or kV according to patient size and/or use of iterative reconstruction technique. CONTRAST:  124m OMNIPAQUE IOHEXOL 350 MG/ML SOLN COMPARISON:  Radiograph 06/04/2022, CT abdomen pelvis 04/10/2022, 10/05/2021, CT chest 01/12/2021, CT chest 10/30/2017 FINDINGS: CTA CHEST FINDINGS Cardiovascular: Satisfactory opacification of the pulmonary arteries to the segmental level. No evidence of pulmonary embolism. Advanced aortic atherosclerosis. No aneurysm. Extensive coronary vascular calcification. Borderline cardiac size. No pericardial effusion. Mediastinum/Nodes: Midline trachea. No thyroid mass. No suspicious lymph nodes. Esophagus within normal limits. Calcified mediastinal and hilar nodes consistent with prior granulomatous disease. Lungs/Pleura: Mild emphysema. Small bilateral pleural effusions. Bandlike density at the left lung base likely reflects atelectasis. Partial posterior consolidation, atelectasis versus mild pneumonia. 5 mm ground-glass nodule at the lingula without change and felt benign, no  follow-up imaging is recommended Musculoskeletal: Sternum is intact. Mild chronic compression deformities at T1, T3, and T8. Review of the MIP images confirms the above findings. 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 Diminutive heavily calcified native distal aorta and iliac vessels. Heavy calcification of the proximal abdominal aorta without aneurysm. Celiac: Appears diminutive. 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 Variant anatomy. SMA: Heavily calcified proximal SMA limits evaluation for stenosis. Splenic and right hepatic arteries appear to take their origin from the SMA. No aneurysm, occlusion or dissection. Renals: Single right and single left renal arteries also heavily calcified. Suspect at least moderate stenosis of the proximal arteries. IMA: Poorly visualized due to artifact from surgical clips. Could potentially be chronically occluded, vascular flow enhancement better visualized on the delayed venous images. Inflow: Status post aortoiliac bypass with patency. Heavily calcified diminutive native vessels. Heavily calcified internal iliac vessels. Veins: No obvious venous abnormality within the limitations of this arterial phase study. Review of the MIP images confirms the above findings. NON-VASCULAR Hepatobiliary: No calcified gallstone. Multiple hypodense subcentimeter liver lesions, too small to further characterize. No biliary dilatation Pancreas: Unremarkable. No pancreatic ductal dilatation or surrounding inflammatory changes. Spleen: Normal in size without focal abnormality. Adrenals/Urinary Tract: Adrenal glands are normal. Atrophic kidneys without hydronephrosis. Multiple renal cystic lesions without highly suspicious lesion, no specific imaging follow-up is recommended. The urinary bladder is decompressed but slightly thick-walled and indistinct. Stomach/Bowel: The stomach is nonenlarged. No dilated small bowel. No  acute bowel wall thickening. Lymphatic: No suspicious lymph nodes Reproductive: Prostate is unremarkable. Other: Negative for pelvic effusion or free air Musculoskeletal: Hardware in the right femur with artifact. Treated compression deformity at L3. Multiple chronic compression fractures involving L1, L2, L4 and L5. Review of the MIP images confirms the above findings. IMPRESSION: 1. Negative for acute pulmonary embolus. 2. Small pleural effusions. Mild airspace disease at left lung base, favor atelectasis and or scar over pneumonia 3. No CT evidence for acute intra-abdominal or pelvic abnormality. Atrophic kidneys without hydronephrosis. 4. 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 Patient is status post aorto bi femoral bypass with patency. Variant branching anatomy of  the celiac and SMA. Electronically Signed   By: Donavan Foil M.D.   On: 06/06/2022 19:47   CT Angio Abd/Pel W and/or Wo Contrast  Result Date: 06/06/2022 CLINICAL DATA:  Chest pain short of breath left upper quadrant abdominal pain EXAM: CT ANGIOGRAPHY CHEST, ABDOMEN AND PELVIS TECHNIQUE: Multidetector CT imaging through the chest, abdomen and pelvis was performed using the standard protocol during bolus administration of intravenous contrast. Multiplanar reconstructed images and MIPs were obtained and reviewed to evaluate the vascular anatomy. RADIATION DOSE REDUCTION: This exam was performed according to the departmental dose-optimization program which includes automated exposure control, adjustment of the mA and/or kV according to patient size and/or use of iterative reconstruction technique. CONTRAST:  121m OMNIPAQUE IOHEXOL 350 MG/ML SOLN COMPARISON:  Radiograph 06/04/2022, CT abdomen pelvis 04/10/2022, 10/05/2021, CT chest 01/12/2021, CT chest 10/30/2017 FINDINGS: CTA CHEST FINDINGS Cardiovascular: Satisfactory opacification of the pulmonary arteries to the segmental level. No evidence of pulmonary embolism.  Advanced aortic atherosclerosis. No aneurysm. Extensive coronary vascular calcification. Borderline cardiac size. No pericardial effusion. Mediastinum/Nodes: Midline trachea. No thyroid mass. No suspicious lymph nodes. Esophagus within normal limits. Calcified mediastinal and hilar nodes consistent with prior granulomatous disease. Lungs/Pleura: Mild emphysema. Small bilateral pleural effusions. Bandlike density at the left lung base likely reflects atelectasis. Partial posterior consolidation, atelectasis versus mild pneumonia. 5 mm ground-glass nodule at the lingula without change and felt benign, no follow-up imaging is recommended Musculoskeletal: Sternum is intact. Mild chronic compression deformities at T1, T3, and T8. Review of the MIP images confirms the above findings. 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 Diminutive heavily calcified native distal aorta and iliac vessels. Heavy calcification of the proximal abdominal aorta without aneurysm. Celiac: Appears diminutive. 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 Variant anatomy. SMA: Heavily calcified proximal SMA limits evaluation for stenosis. Splenic and right hepatic arteries appear to take their origin from the SMA. No aneurysm, occlusion or dissection. Renals: Single right and single left renal arteries also heavily calcified. Suspect at least moderate stenosis of the proximal arteries. IMA: Poorly visualized due to artifact from surgical clips. Could potentially be chronically occluded, vascular flow enhancement better visualized on the delayed venous images. Inflow: Status post aortoiliac bypass with patency. Heavily calcified diminutive native vessels. Heavily calcified internal iliac vessels. Veins: No obvious venous abnormality within the limitations of this arterial phase study. Review of the MIP images confirms the above findings. NON-VASCULAR Hepatobiliary: No calcified  gallstone. Multiple hypodense subcentimeter liver lesions, too small to further characterize. No biliary dilatation Pancreas: Unremarkable. No pancreatic ductal dilatation or surrounding inflammatory changes. Spleen: Normal in size without focal abnormality. Adrenals/Urinary Tract: Adrenal glands are normal. Atrophic kidneys without hydronephrosis. Multiple renal cystic lesions without highly suspicious lesion, no specific imaging follow-up is recommended. The urinary bladder is decompressed but slightly thick-walled and indistinct. Stomach/Bowel: The stomach is nonenlarged. No dilated small bowel. No acute bowel wall thickening. Lymphatic: No suspicious lymph nodes Reproductive: Prostate is unremarkable. Other: Negative for pelvic effusion or free air Musculoskeletal: Hardware in the right femur with artifact. Treated compression deformity at L3. Multiple chronic compression fractures involving L1, L2, L4 and L5. Review of the MIP images confirms the above findings. IMPRESSION: 1. Negative for acute pulmonary embolus. 2. Small pleural effusions. Mild airspace disease at left lung base, favor atelectasis and or scar over pneumonia 3. No CT evidence for acute intra-abdominal or pelvic abnormality. Atrophic kidneys without hydronephrosis. 4. Extensive atherosclerotic vascular disease  of the abdomen and pelvis. 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 Electronically Signed   By: Donavan Foil M.D.   On: 06/06/2022 19:47   DG Abd 2 Views  Result Date: 06/04/2022 CLINICAL DATA:  Abdominal pain EXAM: ABDOMEN - 2 VIEW COMPARISON:  01/27/2007 FINDINGS: Bowel gas pattern is nonspecific. There is presence of gas in few slightly dilated small bowel loops. Stomach is not distended. Gas and stool are present in colon. There is no fecal impaction in rectum. There is no pneumoperitoneum. Extensive arterial calcifications are noted. There is vertebroplasty in  L3 vertebra. There is decrease in height of multiple lumbar vertebral bodies. There is previous internal fixation in right femur. Surgical clips are seen in the para-aortic region and left inguinal region. IMPRESSION: Presence of gas in few slightly dilated small bowel loops may suggest ileus. There is no definite evidence of intestinal obstruction or pneumoperitoneum. Other findings as described in the body of the report. Electronically Signed   By: Elmer Picker M.D.   On: 06/04/2022 17:30   CT HEAD WO CONTRAST (5MM)  Result Date: 05/24/2022 CLINICAL DATA:  Follow-up subdural hematoma EXAM: CT HEAD WITHOUT CONTRAST TECHNIQUE: Contiguous axial images were obtained from the base of the skull through the vertex without intravenous contrast. RADIATION DOSE REDUCTION: This exam was performed according to the departmental dose-optimization program which includes automated exposure control, adjustment of the mA and/or kV according to patient size and/or use of iterative reconstruction technique. COMPARISON:  CT head 04/09/2022 FINDINGS: Brain: Bilateral subdural collections are again seen measuring up to 6 mm over the left frontal lobe and 3 mm over the right frontal lobe, unchanged in size or appearance compared to the study from 04/09/2022. The collections are predominantly hypodense with no evidence of acute blood products. There is no mass effect on the underlying brain parenchyma or midline shift. There is no new acute intracranial hemorrhage or extra-axial fluid collection. There is no acute territorial infarct The ventricles are stable in size. Gray-white differentiation is preserved. There is no mass lesion. Vascular: There is calcification of the bilateral carotid siphons. Skull: Bilateral burr holes are noted. There is no suspicious osseous lesion. Sinuses/Orbits: The paranasal sinuses are clear. Bilateral lens implants are in place. The globes and orbits are otherwise unremarkable. Other: None.  IMPRESSION: Stable left larger than right subdural collections since 04/09/2022 with no evidence of new acute blood products or mass effect on the underlying brain parenchyma. Electronically Signed   By: Valetta Mole M.D.   On: 05/24/2022 15:14    Microbiology: No results found for this or any previous visit (from the past 240 hour(s)).   Labs: Basic Metabolic Panel: Recent Labs  Lab 06/10/22 0123 06/14/22 1350 06/14/22 1931 06/15/22 0451 06/16/22 0736  NA 135 139  --  141 141  K 4.2 4.1  --  3.9 3.8  CL 94* 95*  --  95* 101  CO2 30 32  --  32 31  GLUCOSE 112* 109*  --  89 86  BUN 29* 19  --  24* 28*  CREATININE 7.23* 5.63* 6.20* 6.87* 6.91*  CALCIUM 8.8* 9.2  --  9.0 8.3*  MG  --   --   --  2.2 2.2  PHOS 3.6  --   --  4.7* 3.3   Liver Function Tests: Recent Labs  Lab 06/10/22 0123 06/14/22 1350 06/15/22 0451 06/16/22 0736  AST  --  21 19 17   ALT  --  10 9 8   ALKPHOS  --  115 105 110  BILITOT  --  0.2* 0.3 0.4  PROT  --  5.9* 5.4* 5.6*  ALBUMIN 2.7* 2.9* 2.7* 2.8*   Recent Labs  Lab 06/14/22 1350  LIPASE 39   No results for input(s): "AMMONIA" in the last 168 hours. CBC: Recent Labs  Lab 06/10/22 0123 06/14/22 1350 06/15/22 0451 06/16/22 0736  WBC 7.0 7.3 4.4 5.3  NEUTROABS  --  4.9  --  2.4  HGB 8.0* 8.8* 8.3* 8.3*  HCT 24.6* 28.0* 26.1* 25.6*  MCV 100.8* 104.9* 102.4* 101.6*  PLT 181 213 193 199   Cardiac Enzymes: No results for input(s): "CKTOTAL", "CKMB", "CKMBINDEX", "TROPONINI" in the last 168 hours. BNP: BNP (last 3 results) Recent Labs    06/06/22 2010  BNP 3,250.3*    ProBNP (last 3 results) No results for input(s): "PROBNP" in the last 8760 hours.  CBG: Recent Labs  Lab 06/15/22 1150 06/15/22 1628 06/15/22 2116 06/16/22 1251 06/16/22 1625  GLUCAP 88 151* 81 77 100*       Signed:  Dia Crawford, MD [facility]

## 2022-06-16 NOTE — Plan of Care (Signed)
  Problem: Coping: Goal: Ability to adjust to condition or change in health will improve Outcome: Adequate for Discharge   Problem: Health Behavior/Discharge Planning: Goal: Ability to manage health-related needs will improve Outcome: Adequate for Discharge

## 2022-06-17 DIAGNOSIS — F0390 Unspecified dementia without behavioral disturbance: Secondary | ICD-10-CM | POA: Diagnosis not present

## 2022-06-17 DIAGNOSIS — I480 Paroxysmal atrial fibrillation: Secondary | ICD-10-CM | POA: Diagnosis not present

## 2022-06-17 DIAGNOSIS — I208 Other forms of angina pectoris: Secondary | ICD-10-CM | POA: Diagnosis not present

## 2022-06-17 DIAGNOSIS — R9431 Abnormal electrocardiogram [ECG] [EKG]: Secondary | ICD-10-CM | POA: Diagnosis not present

## 2022-06-17 DIAGNOSIS — R0789 Other chest pain: Secondary | ICD-10-CM | POA: Diagnosis not present

## 2022-06-17 LAB — COMPREHENSIVE METABOLIC PANEL
ALT: 8 U/L (ref 0–44)
AST: 12 U/L — ABNORMAL LOW (ref 15–41)
Albumin: 3 g/dL — ABNORMAL LOW (ref 3.5–5.0)
Alkaline Phosphatase: 34 U/L — ABNORMAL LOW (ref 38–126)
Anion gap: 7 (ref 5–15)
BUN: 13 mg/dL (ref 8–23)
CO2: 22 mmol/L (ref 22–32)
Calcium: 8.2 mg/dL — ABNORMAL LOW (ref 8.9–10.3)
Chloride: 112 mmol/L — ABNORMAL HIGH (ref 98–111)
Creatinine, Ser: 0.95 mg/dL (ref 0.61–1.24)
GFR, Estimated: >60 mL/min (ref >60)
Glucose, Bld: 85 mg/dL (ref 70–99)
Potassium: 3.4 mmol/L — ABNORMAL LOW (ref 3.5–5.1)
Sodium: 141 mmol/L (ref 135–145)
Total Bilirubin: 0.8 mg/dL (ref 0.3–1.2)
Total Protein: 5.4 g/dL — ABNORMAL LOW (ref 6.5–8.1)

## 2022-06-17 LAB — CBC WITH DIFFERENTIAL/PLATELET
Abs Immature Granulocytes: 0 10*3/uL (ref 0.00–0.07)
Basophils Absolute: 0 10*3/uL (ref 0.0–0.1)
Basophils Relative: 1 %
Eosinophils Absolute: 0.1 10*3/uL (ref 0.0–0.5)
Eosinophils Relative: 2 %
HCT: 35.1 % — ABNORMAL LOW (ref 39.0–52.0)
Hemoglobin: 12 g/dL — ABNORMAL LOW (ref 13.0–17.0)
Immature Granulocytes: 0 %
Lymphocytes Relative: 40 %
Lymphs Abs: 1.8 10*3/uL (ref 0.7–4.0)
MCH: 32.8 pg (ref 26.0–34.0)
MCHC: 34.2 g/dL (ref 30.0–36.0)
MCV: 95.9 fL (ref 80.0–100.0)
Monocytes Absolute: 0.5 10*3/uL (ref 0.1–1.0)
Monocytes Relative: 12 %
Neutro Abs: 2 10*3/uL (ref 1.7–7.7)
Neutrophils Relative %: 45 %
Platelets: 155 10*3/uL (ref 150–400)
RBC: 3.66 MIL/uL — ABNORMAL LOW (ref 4.22–5.81)
RDW: 12.6 % (ref 11.5–15.5)
WBC: 4.4 10*3/uL (ref 4.0–10.5)
nRBC: 0 % (ref 0.0–0.2)

## 2022-06-17 LAB — PHOSPHORUS: Phosphorus: 3.1 mg/dL (ref 2.5–4.6)

## 2022-06-17 LAB — BASIC METABOLIC PANEL
Anion gap: 14 (ref 5–15)
BUN: 17 mg/dL (ref 8–23)
CO2: 28 mmol/L (ref 22–32)
Calcium: 9.2 mg/dL (ref 8.9–10.3)
Chloride: 98 mmol/L (ref 98–111)
Creatinine, Ser: 5.89 mg/dL — ABNORMAL HIGH (ref 0.61–1.24)
GFR, Estimated: 9 mL/min — ABNORMAL LOW (ref >60)
Glucose, Bld: 106 mg/dL — ABNORMAL HIGH (ref 70–99)
Potassium: 4.3 mmol/L (ref 3.5–5.1)
Sodium: 140 mmol/L (ref 135–145)

## 2022-06-17 LAB — HEPATITIS B SURFACE ANTIBODY, QUANTITATIVE: Hep B S AB Quant (Post): 184.6 m[IU]/mL (ref >9.9)

## 2022-06-17 LAB — GLUCOSE, CAPILLARY: Glucose-Capillary: 98 mg/dL (ref 70–99)

## 2022-06-17 LAB — MAGNESIUM: Magnesium: 2.1 mg/dL (ref 1.7–2.4)

## 2022-06-17 NOTE — Progress Notes (Signed)
Rounding Note    Patient Name: [name] Date of Encounter: 06/17/2022  [place] Cardiologist: Minus Breeding, MD   Subjective   Denies chest pain.  Oriented x2  Inpatient Medications    Scheduled Meds:  allopurinol  100 mg Oral Daily   Chlorhexidine Gluconate Cloth  6 each Topical Q0600   doxercalciferol  5 mcg Intravenous Q T,Th,Sa-HD   heparin  5,000 Units Subcutaneous Q8H   insulin aspart  0-5 Units Subcutaneous QHS   insulin aspart  0-6 Units Subcutaneous TID WC   levothyroxine  50 mcg Oral QAC breakfast   midodrine  10 mg Oral Q M,W,F   pantoprazole  40 mg Oral Daily   QUEtiapine  25 mg Oral QHS   repaglinide  0.5 mg Oral Q M,W,F-1800   rosuvastatin  10 mg Oral Daily   sevelamer carbonate  2,400 mg Oral TID WC   Continuous Infusions:  PRN Meds: albuterol, sevelamer carbonate   Vital Signs    Vitals:   06/16/22 1206 06/16/22 1957 06/17/22 0433 06/17/22 0513  BP: (!) 142/50 (!) 138/54 126/76   Pulse: 63 61    Resp: 16 19 (!) 21   Temp: 98 F (36.7 C) (!) 97.4 F (36.3 C) 98.2 F (36.8 C)   TempSrc: Oral Oral Oral   SpO2: 97% 98% 99%   Weight:    69.7 kg  Height:        Intake/Output Summary (Last 24 hours) at 06/17/2022 0834 Last data filed at 06/16/2022 2036 Gross per 24 hour  Intake 360 ml  Output 2101 ml  Net -1741 ml      06/17/2022    5:13 AM 06/16/2022   11:17 AM 06/16/2022    7:14 AM  Last 3 Weights  Weight (lbs) 153 lb 10.6 oz 147 lb 7.8 oz 149 lb 14.6 oz  Weight (kg) 69.7 kg 66.9 kg 68 kg      Telemetry    Normal sinus rhythm rate 50s to 60s- Personally Reviewed  ECG    Sinus rhythm, rate 61, first-degree AV block, left axis deviation, poor R wave progression, QTc 519- Personally Reviewed  Physical Exam   GEN: No acute distress.   Neck: No JVD Cardiac: RRR, no murmurs, rubs, or gallops.  Respiratory: Clear to auscultation bilaterally. GI: Soft, nontender, non-distended  MS: No edema Neuro:  Oriented to  person and place Psych: Normal affect   Labs    High Sensitivity Troponin:   Recent Labs  Lab 06/14/22 1931 06/14/22 2111 06/15/22 0925 06/15/22 1132 06/15/22 1305  TROPONINIHS 840* 927* 680* 697* 666*     Chemistry Recent Labs  Lab 06/15/22 0451 06/16/22 0736 06/17/22 0647  NA 141 141 141  K 3.9 3.8 3.4*  CL 95* 101 112*  CO2 32 31 22  GLUCOSE 89 86 85  BUN 24* 28* 13  CREATININE 6.87* 6.91* 0.95  CALCIUM 9.0 8.3* 8.2*  MG 2.2 2.2 2.1  PROT 5.4* 5.6* 5.4*  ALBUMIN 2.7* 2.8* 3.0*  AST 19 17 12*  ALT '9 8 8  '$ ALKPHOS 105 110 34*  BILITOT 0.3 0.4 0.8  GFRNONAA 8* 7* >60  ANIONGAP '14 9 7    '$ Lipids No results for input(s): "CHOL", "TRIG", "HDL", "LABVLDL", "LDLCALC", "CHOLHDL" in the last 168 hours.  Hematology Recent Labs  Lab 06/15/22 0451 06/16/22 0736 06/17/22 0647  WBC 4.4 5.3 4.4  RBC 2.55* 2.52* 3.66*  HGB 8.3* 8.3* 12.0*  HCT 26.1* 25.6* 35.1*  MCV 102.4* 101.6* 95.9  MCH 32.5 32.9 32.8  MCHC 31.8 32.4 34.2  RDW 17.3* 17.2* 12.6  PLT 193 199 155   Thyroid No results for input(s): "TSH", "FREET4" in the last 168 hours.  BNPNo results for input(s): "BNP", "PROBNP" in the last 168 hours.  DDimer No results for input(s): "DDIMER" in the last 168 hours.   Radiology    No results found.  Cardiac Studies     Patient Profile     81 y.o. male with a history of CAD, atrial fibrillation, chronic anemia, recurrent GI bleeds, chronic subdural hematoma, ESRD, dementia, PAD, hypertension, OSA who we are consulted for evaluation of troponin elevation  Assessment & Plan    Elevated troponin: Troponin peak 927.  Unclear if having chest pain, difficult historian.  Could represent demand ischemia in setting of A-fib with RVR on presentation.  He is not a candidate for invasive evaluation given his dementia and chronic anemia with recurrent GI bleeding iand chronic subdural hematoma  Atrial fibrillation: He was in A-fib with RVR when EMS arrived.  Converted to  sinus rhythm in route to ED.  Not on anticoagulation due to recurrent GI bleeding.  Was on amiodarone 200 mg daily, was discontinued on admission due to prolonged QT.  Not on AV nodal blockers due to soft BP, requiring midodrine  Prolonged QT: QTc 631 on most recent EKG.  Amiodarone discontinued.  EKG 9/23 shows improvement, QTc 519.  Avoid QT prolonging medications.  Chronic systolic heart failure: Echocardiogram 06/07/2022 showed EF 45 to 50%.  GDMT has been limited by chronic hypotension, requires midodrine for HD  [place] will sign off.   Medication Recommendations: Discontinued amiodarone Other recommendations (labs, testing, etc): None Follow up as an outpatient: Will schedule   For questions or updates, please contact [place] Please consult [email or web address] for contact info under        Signed, Donato Heinz, MD  06/17/2022, 8:34 AM

## 2022-06-17 NOTE — Progress Notes (Signed)
[facility] Progress Note   Subjective:   Eating breakfast, in a great mood. Says he is going home today. Denies SOB, CP, dizziness and nausea.   Objective Vitals:   06/16/22 1206 06/16/22 1957 06/17/22 0433 06/17/22 0513  BP: (!) 142/50 (!) 138/54 126/76   Pulse: 63 61    Resp: 16 19 (!) 21   Temp: 98 F (36.7 C) (!) 97.4 F (36.3 C) 98.2 F (36.8 C)   TempSrc: Oral Oral Oral   SpO2: 97% 98% 99%   Weight:    69.7 kg  Height:       Physical Exam General: Elderly male, alert and in NAD Heart: RRR, no murmurs, rubs or gallops Lungs: CTA bilaterally, respirations unlabored on RA Abdomen: soft, non-distended, +BS Extremities: no edema b/l lower extremities Dialysis Access:  AVF + bruit  Additional Objective Labs: Basic Metabolic Panel: Recent Labs  Lab 06/15/22 0451 06/16/22 0736 06/17/22 0647  NA 141 141 141  K 3.9 3.8 3.4*  CL 95* 101 112*  CO2 32 31 22  GLUCOSE 89 86 85  BUN 24* 28* 13  CREATININE 6.87* 6.91* 0.95  CALCIUM 9.0 8.3* 8.2*  PHOS 4.7* 3.3 3.1   Liver Function Tests: Recent Labs  Lab 06/15/22 0451 06/16/22 0736 06/17/22 0647  AST 19 17 12*  ALT '9 8 8  '$ ALKPHOS 105 110 34*  BILITOT 0.3 0.4 0.8  PROT 5.4* 5.6* 5.4*  ALBUMIN 2.7* 2.8* 3.0*   Recent Labs  Lab 06/14/22 1350  LIPASE 39   CBC: Recent Labs  Lab 06/14/22 1350 06/15/22 0451 06/16/22 0736 06/17/22 0647  WBC 7.3 4.4 5.3 4.4  NEUTROABS 4.9  --  2.4 2.0  HGB 8.8* 8.3* 8.3* 12.0*  HCT 28.0* 26.1* 25.6* 35.1*  MCV 104.9* 102.4* 101.6* 95.9  PLT 213 193 199 155   Blood Culture    Component Value Date/Time   SDES URINE, CLEAN CATCH 04/09/2022 2051   SPECREQUEST  04/09/2022 2051    NONE Performed at [facility], [address] [place], [place] [address]    CULT MULTIPLE SPECIES PRESENT, SUGGEST RECOLLECTION (A) 04/09/2022 2051   REPTSTATUS 04/11/2022 FINAL 04/09/2022 2051    Cardiac Enzymes: No results for input(s): "CKTOTAL", "CKMB",  "CKMBINDEX", "TROPONINI" in the last 168 hours. CBG: Recent Labs  Lab 06/15/22 2116 06/16/22 1251 06/16/22 1625 06/16/22 2131 06/17/22 0613  GLUCAP 81 77 100* 144* 98   Iron Studies: No results for input(s): "IRON", "TIBC", "TRANSFERRIN", "FERRITIN" in the last 72 hours. '@lablastinr3'$ @ Studies/Results: No results found. Medications:   allopurinol  100 mg Oral Daily   Chlorhexidine Gluconate Cloth  6 each Topical Q0600   doxercalciferol  5 mcg Intravenous Q T,Th,Sa-HD   heparin  5,000 Units Subcutaneous Q8H   insulin aspart  0-5 Units Subcutaneous QHS   insulin aspart  0-6 Units Subcutaneous TID WC   levothyroxine  50 mcg Oral QAC breakfast   midodrine  10 mg Oral Q M,W,F   pantoprazole  40 mg Oral Daily   QUEtiapine  25 mg Oral QHS   repaglinide  0.5 mg Oral Q M,W,F-1800   rosuvastatin  10 mg Oral Daily   sevelamer carbonate  2,400 mg Oral TID WC    Outpatient Dialysis Orders: Center: [facility]  on MWF. 180NRe 3 hr 45 min BFR 400 DFR Auto 1.5 EDW 70.5kg 2K 2Ca AVF 15g no heparin Mircera 151mg IV q 2 weeks- last dose 1254m on 9/18 Hectorol 25m58m  IV q HD  Assessment/Plan: Chest pain: in a. Fib RVR on arrival but now in sinus rhythm. Conservative treatment recommended, management per cardiology  ESRD:  MWF schedule, no volume overload on exam. He preferred to have HD off schedule Saturday. Resume MWF schedule tomorrow.   Hypertension/volume: BP controlled, no volume overload on exam and is close to his EDW. Takes midodrine '10mg'$  PO pre HD, will continue here.   Anemia: Hb 8.3 then 12 today, not sure if that is accurate, hx of recurrent GI bleed but no active bleeding reported. No heparin with HD. Continue ESA.   Metabolic bone disease: Corrected calcium elevated on arrival, reduced hectorol dose and calcium is not at goal. Phos at goal, continue renvela  Nutrition:  Renal diet/fluid restriction  Anice Paganini, PA-C 06/17/2022, 9:15 AM  [facility] Pager: [phone]

## 2022-06-17 NOTE — TOC Transition Note (Signed)
Transition of Care [facility]) - CM/SW Discharge Note   Patient Details  Name: OTTIE TILLERY MRN: [id number] Date of Birth: [birth year]  Transition of Care [facility]) CM/SW Contact:  Carles Collet, RN Phone Number: 06/17/2022, 9:01 AM   Clinical Narrative:     Kaylyn Layer of DC today.  No other TOC needs identified for DC    Barriers to Discharge: Continued Medical Work up   Patient Goals and CMS Choice Patient states their goals for this hospitalization and ongoing recovery are:: return home CMS Medicare.gov Compare Post Acute Care list provided to:: Patient Represenative (must comment) (spouse) Choice offered to / list presented to : Spouse  Discharge Placement                       Discharge Plan and Services In-house Referral: NA Discharge Planning Services: CM Consult Post Acute Care Choice: Home Health            DME Agency: NA       HH Arranged: PT HH Agency: [place] Date [facility] Agency Contacted: 06/15/22 Time [place]: [id number] Representative spoke with at [place]: [place] ([name]) Interventions     Readmission Risk Interventions    06/15/2022    3:42 PM 06/10/2022    2:43 PM 11/06/2021   12:09 PM  Readmission Risk Prevention Plan  Transportation Screening Complete Complete Complete  PCP or Specialist Appt within 3-5 Days   Complete  HRI or [place]   Complete  Social Work Consult for [place] Planning/Counseling   Complete  Palliative Care Screening   Not Applicable  Medication Review [employer]) Complete Complete Complete  PCP or Specialist appointment within 3-5 days of discharge Complete Complete   HRI or [place] Complete Complete   SW Recovery Care/Counseling Consult Complete Complete   Palliative Care Screening Not Applicable [place] Not Applicable Not Applicable

## 2022-06-17 NOTE — Discharge Summary (Signed)
Physician Discharge Summary  [name] DZH:[id number] DOB: 1940/09/30 DOA: 06/14/2022  PCP: Elby Showers, MD  Admit date: 06/14/2022 Discharge date: 06/17/2022 30 Day Unplanned Readmission Risk Score    Flowsheet Row ED to Hosp-Admission (Current) from 06/14/2022 in [place] [facility]  30 Day Unplanned Readmission Risk Score (%) 31.36 Filed at 06/17/2022 0801       This score is the patient's risk of an unplanned readmission within 30 days of being discharged (0 -100%). The score is based on dignosis, age, lab data, medications, orders, and past utilization.   Low:  0-14.9   Medium: 15-21.9   High: 22-29.9   Extreme: 30 and above          Admitted From: Home Disposition: Home  Recommendations for Outpatient Follow-up:  Follow up with PCP in 1-2 weeks Please obtain BMP/CBC in one week Please follow up with your PCP on the following pending results: Unresulted Labs (From admission, onward)     Start     Ordered   06/17/22 4196  Basic metabolic panel  ONCE - STAT,   STAT       Question:  Specimen collection method  Answer:  Lab=Lab collect   06/17/22 0824   06/16/22 0500  Comprehensive metabolic panel  Daily at 5am,   R     Question:  Specimen collection method  Answer:  Lab=Lab collect   06/15/22 0741   06/16/22 0500  CBC with Differential/Platelet  Daily at 5am,   R     Question:  Specimen collection method  Answer:  Lab=Lab collect   06/15/22 0741   06/15/22 1534  Hepatitis B surface antibody,quantitative  (New Admission Hemo Labs (Hepatitis B))  Once,   R       Question:  Specimen collection method  Answer:  Lab=Lab collect   06/15/22 1535   06/15/22 0742  Magnesium  Daily at 5am,   R     Question:  Specimen collection method  Answer:  Lab=Lab collect   06/15/22 0741   06/15/22 0742  Phosphorus  Daily at 5am,   R     Question:  Specimen collection method  Answer:  Lab=Lab collect   06/15/22 0741   06/14/22 1336  Urinalysis, Routine w reflex microscopic   Once,   URGENT        06/14/22 [place]: Yes Equipment/Devices: None  Discharge Condition: Stable CODE STATUS: DNR Diet recommendation: Cardiac/renal  Subjective: Examined.  He is feeling well.  No complaints.  Ready to go home.  Brief/Interim Summary:  Chronic systolic and diastolic CHF, pulmonary HTN -9/14 EF 45 to 50%   A-fib with RVR - Per cardiology note 9/21 they suspect elevated troponin secondary to demand ischemia not a candidate for invasive work-up given his dementia and anemia - 9/23 NSR   Atypical chest pain, ACS ruled out Full dose aspirin 325 mg x 1 Resume home Crestor Left chest pain radiating to left lower abdomen Presented with A-fib with RVR Troponin elevated greater than 300, no evidence of acute ischemia on 12-lead EKG, suspected demand ischemia in the setting of A-fib with RVR. Cardiology following, appreciate assistance Recent 2D echo on 06/07/2022 which revealed LVEF 45 to 50% with left ventricle global hypokinesis.   Chronic hypotension Resume home midodrine regimen Maintain MAP greater than 65 Continue to monitor vital signs      Prolonged Qtc Admission twelve-lead  EKG, QTc 575. Avoid QTc prolonging agents Electrolytes managed with hemodialysis   Anemia of chronic disease/Hx GI bleed/gastric small bowel AVMs GERD Hemoglobin appears to be at baseline 8.8 No overt bleeding reported Resume home PPI    ESRD on HD M/W/F -Last HD on 9/23 - Resume home HD schedule     Chronic left lower quadrant abdominal pain, now resolved. CT abdomen and pelvis without contrast was unrevealing. Consider GI consult during this admission   Dementia Resume home regimen Reorient as needed   DM type II controlled with Hyperglycemia -8/24 hemoglobin A1c = 6.5 - Restart home medications   Hypothyroidism -Resume home levothyroxine   Gout -Resume home allopurinol  Discharge plan was discussed with patient and/or family  member and they verbalized understanding and agreed with it.  Discharge Diagnoses:  Principal Problem:   Chest pain Active Problems:   Essential hypertension   Dementia without behavioral disturbance (HCC)   Paroxysmal atrial fibrillation (HCC)   Prolonged QT interval   Systolic and diastolic CHF, chronic (HCC)   Atypical chest pain   Anemia of chronic disease   End-stage renal disease on hemodialysis (HCC)   Abdominal pain, chronic, left lower quadrant   Controlled type 2 diabetes mellitus with hyperglycemia ([place])    Discharge Instructions   Allergies as of 06/17/2022       Reactions   Ambien [zolpidem Tartrate] Other (See Comments)   Hallucinations and "felt crazy"    Penicillins Hives, Rash   Has patient had a PCN reaction causing immediate rash, facial/tongue/throat swelling, SOB or lightheadedness with hypotension: Yes Has patient had a PCN reaction causing severe rash involving mucus membranes or skin necrosis: Yes Has patient had a PCN reaction that required hospitalization: No Has patient had a PCN reaction occurring within the last 10 years: No If all of the above answers are "NO", then may proceed with Cephalosporin use.        Medication List     STOP taking these medications    amiodarone 200 MG tablet Commonly known as: PACERONE       TAKE these medications    acetaminophen 325 MG tablet Commonly known as: TYLENOL Take 1-2 tablets (325-650 mg total) by mouth every 4 (four) hours as needed for mild pain. What changed:  how much to take when to take this   albuterol 108 (90 Base) MCG/ACT inhaler Commonly known as: VENTOLIN HFA Inhale 1-2 puffs into the lungs daily as needed for wheezing or shortness of breath.   allopurinol 100 MG tablet Commonly known as: ZYLOPRIM Take 100 mg by mouth daily.   camphor-menthol lotion Commonly known as: SARNA Apply 1 application topically 4 (four) times daily as needed for itching.   cetirizine 10 MG  tablet Commonly known as: ZYRTEC Take 10 mg by mouth daily as needed for allergies.   cyanocobalamin 1000 MCG/ML injection Commonly known as: VITAMIN B12 Inject 1 mL (1,000 mcg total) into the muscle every 30 (thirty) days.   donepezil 10 MG tablet Commonly known as: ARICEPT Take 1 tablet (10 mg total) by mouth at bedtime.   doxercalciferol 4 MCG/2ML injection Commonly known as: HECTOROL Inject 2.5 mLs (5 mcg total) into the vein every Monday, Wednesday, and Friday with hemodialysis. Start taking on: June 18, 2022   feeding supplement (NEPRO CARB STEADY) Liqd Take 237 mLs by mouth in the morning.   fluticasone 50 MCG/ACT nasal spray Commonly known as: FLONASE Place 1 spray into both nostrils daily. What changed:  how  much to take when to take this reasons to take this   Forteo 600 MCG/2.4ML Sopn Generic drug: Teriparatide (Recombinant) INJECT 20 MCG UNDER THE SKIN DAILY (DISCARD 28 DAYS AFTER INITIAL USE) What changed: See the new instructions.   FreeStyle Freedom Lite w/Device Kit Use to check blood sugar 2 times per day dx code E11.65   freestyle lancets Use as instructed to check blood sugar 2 times per day dx code E11.65   FREESTYLE LITE test strip Generic drug: glucose blood USE AS DIRECTED THREE TIMES DAILY   hydrocortisone 2.5 % cream Apply 1 application topically 3 (three) times daily as needed (skin irritation (legs & arms)).   levothyroxine 50 MCG tablet Commonly known as: SYNTHROID Take 1 tablet (50 mcg total) by mouth daily. What changed: when to take this   memantine 10 MG tablet Commonly known as: NAMENDA Take 1 tablet (10 mg total) by mouth 2 (two) times daily.   midodrine 10 MG tablet Commonly known as: PROAMATINE Take one pill prior to hemodialysis on MWF What changed:  how much to take how to take this when to take this additional instructions   omeprazole 40 MG capsule Commonly known as: PRILOSEC Take 1 capsule (40 mg total) by  mouth daily. What changed: when to take this   QUEtiapine 25 MG tablet Commonly known as: SEROquel Take 1 tablet (25 mg total) by mouth at bedtime.   repaglinide 0.5 MG tablet Commonly known as: PRANDIN TAKE 1 TABLET(0.5 MG) BY MOUTH TWICE DAILY BEFORE A MEAL What changed: See the new instructions.   rosuvastatin 10 MG tablet Commonly known as: CRESTOR Take 1 tablet (10 mg total) by mouth daily.   sevelamer carbonate 800 MG tablet Commonly known as: RENVELA Take 1,600-2,400 mg by mouth See admin instructions. Take 2,400 mg by mouth three times a day with meals and 1,600 mg with snacks   SYRINGE 3CC/25GX1" 25G X 1" 3 ML Misc 1 each by Does not apply route every 30 (thirty) days.   Theratears 0.25 % Soln Generic drug: Carboxymethylcellulose Sodium Place 1 drop into both eyes 3 (three) times daily as needed (for dryness).   Tradjenta 5 MG Tabs tablet Generic drug: linagliptin Take 1 tablet (5 mg total) by mouth daily.        Follow-up Information     Baxley, Cresenciano Lick, MD Follow up on 06/26/2022.   Specialty: Internal Medicine Why: 12 noon for hospital folllow up Contact information: [address] [place] [address] [phone]         Minus Breeding, MD .   Specialty: Cardiology Contact information: [address] [place] [address] [place], [facility] Follow up.   Specialty: Home Health Services Contact information: [place] [address] [phone]                Allergies  Allergen Reactions   Ambien [Zolpidem Tartrate] Other (See Comments)    Hallucinations and "felt crazy"    Penicillins Hives and Rash    Has patient had a PCN reaction causing immediate rash, facial/tongue/throat swelling, SOB or lightheadedness with hypotension: Yes Has patient had a PCN reaction causing severe rash involving mucus membranes or skin necrosis: Yes Has patient had a PCN  reaction that required hospitalization: No Has patient had a PCN reaction occurring within the last 10 years: No If all of the above answers are "NO", then may proceed with  Cephalosporin use.     Consultations: Cardiology and nephrology   Procedures/Studies: CT ABDOMEN PELVIS WO CONTRAST  Result Date: 06/14/2022 CLINICAL DATA:  Left lower quadrant abdominal pain EXAM: CT ABDOMEN AND PELVIS WITHOUT CONTRAST TECHNIQUE: Multidetector CT imaging of the abdomen and pelvis was performed following the standard protocol without IV contrast. RADIATION DOSE REDUCTION: This exam was performed according to the departmental dose-optimization program which includes automated exposure control, adjustment of the mA and/or kV according to patient size and/or use of iterative reconstruction technique. COMPARISON:  06/06/2022 FINDINGS: Lower chest: Trace left pleural effusion with mild dependent atelectasis. Heart size is normal. Aortic and coronary artery atherosclerosis. Right-sided gynecomastia. Hepatobiliary: Stable subcentimeter low-density lesions within the liver, too small to characterize. Liver otherwise within normal limits. Unremarkable gallbladder. No hyperdense gallstone. No biliary dilatation. Pancreas: Unremarkable. No pancreatic ductal dilatation or surrounding inflammatory changes. Spleen: Normal in size without focal abnormality. Adrenals/Urinary Tract: Unremarkable adrenal glands. Bilateral renal atrophy. Numerous bilateral renal cysts, stable, and no dedicated imaging follow-up is recommended. No renal stone or hydronephrosis. Urinary bladder is decompressed. Stomach/Bowel: Stomach is within normal limits. Scattered colonic diverticulosis. No evidence of bowel wall thickening, distention, or inflammatory changes. Vascular/Lymphatic: Advanced atherosclerotic calcifications throughout the aortoiliac axis. Prior aorto bi-iliac stent graft, unable to be assessed on noncontrast imaging. No abdominopelvic  lymphadenopathy. Reproductive: Prostate is unremarkable. Other: No free fluid. No abdominopelvic fluid collection. No pneumoperitoneum. Postsurgical changes to the anterior abdominal wall. Musculoskeletal: No interval change of lumbar vertebral body compression fractures involving L1, L2, L3, L4, and L5. Prior right hip ORIF. No new or acute bony findings. IMPRESSION: 1. No acute abdominopelvic findings. 2. Trace left pleural effusion with mild dependent atelectasis. 3. Colonic diverticulosis without evidence of acute diverticulitis. 4. Aortic and coronary artery atherosclerosis (ICD10-I70.0). Electronically Signed   By: Davina Poke D.O.   On: 06/14/2022 14:38   DG Chest 2 View  Result Date: 06/14/2022 CLINICAL DATA:  Chest pain. EXAM: CHEST - 2 VIEW COMPARISON:  CT chest dated June 06, 2022. Chest x-ray dated October 01, 2021. FINDINGS: The heart size and mediastinal contours are within normal limits. Normal pulmonary vascularity. Unchanged small left pleural effusion with adjacent left lower lobe opacity. The right lung is clear. No pneumothorax. No acute osseous abnormality. IMPRESSION: 1. Unchanged small left pleural effusion with adjacent left lower lobe atelectasis versus infiltrate. Electronically Signed   By: Titus Dubin M.D.   On: 06/14/2022 14:08   ECHOCARDIOGRAM COMPLETE  Result Date: 06/07/2022    ECHOCARDIOGRAM REPORT   Patient Name:   [name] Kraemer Date of Exam: 06/07/2022 Medical Rec #:  [id number]      Height:       67.0 in Accession #:    [id number]     Weight:       156.6 lb Date of Birth:  19-Sep-1941      BSA:          1.823 m Patient Age:    13 years       BP:           134/90 mmHg Patient Gender: M              HR:           52 bpm. Exam Location:  Inpatient Procedure: 2D Echo, Color Doppler and Cardiac Doppler Indications:    Chest pain/Elevated troponin  History:        Patient has prior history of Echocardiogram examinations, most  recent 11/07/2020. CAD, Hx  of AAA, Signs/Symptoms:Alzheimer's;                 Risk Factors:Diabetes, Dyslipidemia, Hypertension and Sleep                 Apnea.  Sonographer:    Eartha Inch Referring Phys: [id number] [place]  Sonographer Comments: Technically difficult study due to poor echo windows. Image acquisition challenging due to patient body habitus. Pt scanned supine. Could not be positioned in left lateral decubitus due to back pain. IMPRESSIONS  1. LV apical false tendon (normal variant). Left ventricular ejection fraction, by estimation, is 45 to 50%. The left ventricle has mildly decreased function. The left ventricle demonstrates global hypokinesis. There is mild left ventricular hypertrophy. Left ventricular diastolic parameters are consistent with Grade I diastolic dysfunction (impaired relaxation). Elevated left ventricular end-diastolic pressure. The E/e' is 66.  2. Right ventricular systolic function is normal. The right ventricular size is normal. There is mildly elevated pulmonary artery systolic pressure. The estimated right ventricular systolic pressure is 59.5 mmHg.  3. The mitral valve is abnormal. Mild to moderate mitral valve regurgitation.  4. The tricuspid valve is abnormal. Tricuspid valve regurgitation is mild to moderate.  5. The aortic valve is tricuspid. Aortic valve regurgitation is not visualized.  6. The inferior vena cava is dilated in size with <50% respiratory variability, suggesting right atrial pressure of 15 mmHg. Comparison(s): Changes from prior study are noted. 11/07/2020: LVEF 55-60%, RVSP 40 mmHg, trivial MR. FINDINGS  Left Ventricle: LV apical false tendon (normal variant). Left ventricular ejection fraction, by estimation, is 45 to 50%. The left ventricle has mildly decreased function. The left ventricle demonstrates global hypokinesis. The left ventricular internal  cavity size was normal in size. There is mild left ventricular hypertrophy. Left ventricular diastolic parameters are  consistent with Grade I diastolic dysfunction (impaired relaxation). Elevated left ventricular end-diastolic pressure. The E/e' is 55. Right Ventricle: The right ventricular size is normal. No increase in right ventricular wall thickness. Right ventricular systolic function is normal. There is mildly elevated pulmonary artery systolic pressure. The tricuspid regurgitant velocity is 2.52  m/s, and with an assumed right atrial pressure of 15 mmHg, the estimated right ventricular systolic pressure is 63.8 mmHg. Left Atrium: Left atrial size was normal in size. Right Atrium: Right atrial size was normal in size. Pericardium: There is no evidence of pericardial effusion. Mitral Valve: The mitral valve is abnormal. There is mild thickening of the anterior and posterior mitral valve leaflet(s). Mild mitral annular calcification. Mild to moderate mitral valve regurgitation. Tricuspid Valve: The tricuspid valve is abnormal. Tricuspid valve regurgitation is mild to moderate. Aortic Valve: The aortic valve is tricuspid. Aortic valve regurgitation is not visualized. Pulmonic Valve: The pulmonic valve was normal in structure. Pulmonic valve regurgitation is not visualized. Aorta: The aortic root and ascending aorta are structurally normal, with no evidence of dilitation. Venous: The inferior vena cava is dilated in size with less than 50% respiratory variability, suggesting right atrial pressure of 15 mmHg. IAS/Shunts: No atrial level shunt detected by color flow Doppler.  LEFT VENTRICLE PLAX 2D LVIDd:         3.60 cm     Diastology LVIDs:         3.20 cm     LV e' medial:    5.33 cm/s LV PW:         1.20 cm     LV E/e' medial:  21.2 LV IVS:  1.20 cm     LV e' lateral:   6.53 cm/s LVOT diam:     2.10 cm     LV E/e' lateral: 17.3 LV SV:         73 LV SV Index:   40 LVOT Area:     3.46 cm  LV Volumes (MOD) LV vol d, MOD A2C: 89.0 ml LV vol s, MOD A2C: 52.5 ml LV SV MOD A2C:     36.5 ml RIGHT VENTRICLE             IVC RV S  prime:     14.50 cm/s  IVC diam: 2.10 cm TAPSE (M-mode): 2.6 cm LEFT ATRIUM             Index        RIGHT ATRIUM           Index LA diam:        3.60 cm 1.98 cm/m   RA Area:     17.80 cm LA Vol (A2C):   45.1 ml 24.75 ml/m  RA Volume:   45.10 ml  24.75 ml/m LA Vol (A4C):   49.8 ml 27.32 ml/m LA Biplane Vol: 49.7 ml 27.27 ml/m  AORTIC VALVE LVOT Vmax:   95.00 cm/s LVOT Vmean:  62.500 cm/s LVOT VTI:    0.211 m  AORTA Ao Root diam: 3.40 cm Ao Asc diam:  3.30 cm MITRAL VALVE                  TRICUSPID VALVE MV Area (PHT): 3.74 cm       TR Peak grad:   25.4 mmHg MV Decel Time: 203 msec       TR Mean grad:   16.0 mmHg MR Peak grad:    141.1 mmHg   TR Vmax:        252.00 cm/s MR Mean grad:    102.0 mmHg   TR Vmean:       193.0 cm/s MR Vmax:         594.00 cm/s MR Vmean:        475.0 cm/s   SHUNTS MR PISA:         1.57 cm     Systemic VTI:  0.21 m MR PISA Eff ROA: 10 mm       Systemic Diam: 2.10 cm MR PISA Radius:  0.50 cm MV E velocity: 113.00 cm/s MV A velocity: 126.00 cm/s MV E/A ratio:  0.90 Lyman Bishop MD Electronically signed by Lyman Bishop MD Signature Date/Time: 06/07/2022/4:00:16 PM    Final    CT Angio Chest PE W and/or Wo Contrast  Result Date: 06/06/2022 CLINICAL DATA:  Chest pain short of breath left upper quadrant abdominal pain EXAM: CT ANGIOGRAPHY CHEST, ABDOMEN AND PELVIS TECHNIQUE: Multidetector CT imaging through the chest, abdomen and pelvis was performed using the standard protocol during bolus administration of intravenous contrast. Multiplanar reconstructed images and MIPs were obtained and reviewed to evaluate the vascular anatomy. RADIATION DOSE REDUCTION: This exam was performed according to the departmental dose-optimization program which includes automated exposure control, adjustment of the mA and/or kV according to patient size and/or use of iterative reconstruction technique. CONTRAST:  190m OMNIPAQUE IOHEXOL 350 MG/ML SOLN COMPARISON:  Radiograph 06/04/2022, CT abdomen pelvis  04/10/2022, 10/05/2021, CT chest 01/12/2021, CT chest 10/30/2017 FINDINGS: CTA CHEST FINDINGS Cardiovascular: Satisfactory opacification of the pulmonary arteries to the segmental level. No evidence of pulmonary embolism. Advanced aortic atherosclerosis. No aneurysm. Extensive coronary vascular calcification.  Borderline cardiac size. No pericardial effusion. Mediastinum/Nodes: Midline trachea. No thyroid mass. No suspicious lymph nodes. Esophagus within normal limits. Calcified mediastinal and hilar nodes consistent with prior granulomatous disease. Lungs/Pleura: Mild emphysema. Small bilateral pleural effusions. Bandlike density at the left lung base likely reflects atelectasis. Partial posterior consolidation, atelectasis versus mild pneumonia. 5 mm ground-glass nodule at the lingula without change and felt benign, no follow-up imaging is recommended Musculoskeletal: Sternum is intact. Mild chronic compression deformities at T1, T3, and T8. Review of the MIP images confirms the above findings. 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 Diminutive heavily calcified native distal aorta and iliac vessels. Heavy calcification of the proximal abdominal aorta without aneurysm. Celiac: Appears diminutive. 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 Variant anatomy. SMA: Heavily calcified proximal SMA limits evaluation for stenosis. Splenic and right hepatic arteries appear to take their origin from the SMA. No aneurysm, occlusion or dissection. Renals: Single right and single left renal arteries also heavily calcified. Suspect at least moderate stenosis of the proximal arteries. IMA: Poorly visualized due to artifact from surgical clips. Could potentially be chronically occluded, vascular flow enhancement better visualized on the delayed venous images. Inflow: Status post aortoiliac bypass with patency. Heavily calcified diminutive native vessels.  Heavily calcified internal iliac vessels. Veins: No obvious venous abnormality within the limitations of this arterial phase study. Review of the MIP images confirms the above findings. NON-VASCULAR Hepatobiliary: No calcified gallstone. Multiple hypodense subcentimeter liver lesions, too small to further characterize. No biliary dilatation Pancreas: Unremarkable. No pancreatic ductal dilatation or surrounding inflammatory changes. Spleen: Normal in size without focal abnormality. Adrenals/Urinary Tract: Adrenal glands are normal. Atrophic kidneys without hydronephrosis. Multiple renal cystic lesions without highly suspicious lesion, no specific imaging follow-up is recommended. The urinary bladder is decompressed but slightly thick-walled and indistinct. Stomach/Bowel: The stomach is nonenlarged. No dilated small bowel. No acute bowel wall thickening. Lymphatic: No suspicious lymph nodes Reproductive: Prostate is unremarkable. Other: Negative for pelvic effusion or free air Musculoskeletal: Hardware in the right femur with artifact. Treated compression deformity at L3. Multiple chronic compression fractures involving L1, L2, L4 and L5. Review of the MIP images confirms the above findings. IMPRESSION: 1. Negative for acute pulmonary embolus. 2. Small pleural effusions. Mild airspace disease at left lung base, favor atelectasis and or scar over pneumonia 3. No CT evidence for acute intra-abdominal or pelvic abnormality. Atrophic kidneys without hydronephrosis. 4. 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 Electronically Signed   By: Donavan Foil M.D.   On: 06/06/2022 19:47   CT Angio Abd/Pel W and/or Wo Contrast  Result Date: 06/06/2022 CLINICAL DATA:  Chest pain short of breath left upper quadrant abdominal pain EXAM: CT ANGIOGRAPHY CHEST, ABDOMEN AND PELVIS TECHNIQUE:  Multidetector CT imaging through the chest, abdomen and pelvis was performed using the standard protocol during bolus administration of intravenous contrast. Multiplanar reconstructed images and MIPs were obtained and reviewed to evaluate the vascular anatomy. RADIATION DOSE REDUCTION: This exam was performed according to the departmental dose-optimization program which includes automated exposure control, adjustment of the mA and/or kV according to patient size and/or use of iterative reconstruction technique. CONTRAST:  131m OMNIPAQUE IOHEXOL 350 MG/ML SOLN COMPARISON:  Radiograph 06/04/2022, CT abdomen pelvis 04/10/2022, 10/05/2021, CT chest 01/12/2021, CT chest 10/30/2017 FINDINGS: CTA CHEST FINDINGS Cardiovascular: Satisfactory opacification of the pulmonary  arteries to the segmental level. No evidence of pulmonary embolism. Advanced aortic atherosclerosis. No aneurysm. Extensive coronary vascular calcification. Borderline cardiac size. No pericardial effusion. Mediastinum/Nodes: Midline trachea. No thyroid mass. No suspicious lymph nodes. Esophagus within normal limits. Calcified mediastinal and hilar nodes consistent with prior granulomatous disease. Lungs/Pleura: Mild emphysema. Small bilateral pleural effusions. Bandlike density at the left lung base likely reflects atelectasis. Partial posterior consolidation, atelectasis versus mild pneumonia. 5 mm ground-glass nodule at the lingula without change and felt benign, no follow-up imaging is recommended Musculoskeletal: Sternum is intact. Mild chronic compression deformities at T1, T3, and T8. Review of the MIP images confirms the above findings. 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 Diminutive heavily calcified native distal aorta and iliac vessels. Heavy calcification of the proximal abdominal aorta without aneurysm. Celiac: Appears diminutive. 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 Variant anatomy. SMA: Heavily calcified proximal SMA limits evaluation for stenosis. Splenic and right hepatic arteries appear to take their origin from the SMA. No aneurysm, occlusion or dissection. Renals: Single right and single left renal arteries also heavily calcified. Suspect at least moderate stenosis of the proximal arteries. IMA: Poorly visualized due to artifact from surgical clips. Could potentially be chronically occluded, vascular flow enhancement better visualized on the delayed venous images. Inflow: Status post aortoiliac bypass with patency. Heavily calcified diminutive native vessels. Heavily calcified internal iliac vessels. Veins: No obvious venous abnormality within the limitations of this arterial phase study. Review of the MIP images confirms the above findings. NON-VASCULAR Hepatobiliary: No calcified gallstone. Multiple hypodense subcentimeter liver lesions, too small to further characterize. No biliary dilatation Pancreas: Unremarkable. No pancreatic ductal dilatation or surrounding inflammatory changes. Spleen: Normal in size without focal abnormality. Adrenals/Urinary Tract: Adrenal glands are normal. Atrophic kidneys without hydronephrosis. Multiple renal cystic lesions without highly suspicious lesion, no specific imaging follow-up is recommended. The urinary bladder is decompressed but slightly thick-walled and indistinct. Stomach/Bowel: The stomach is nonenlarged. No dilated small bowel. No acute bowel wall thickening. Lymphatic: No suspicious lymph nodes Reproductive: Prostate is unremarkable. Other: Negative for pelvic effusion or free air Musculoskeletal: Hardware in the right femur with artifact. Treated compression deformity at L3. Multiple chronic compression fractures involving L1, L2, L4 and L5. Review of the MIP images confirms the above findings. IMPRESSION: 1. Negative for acute pulmonary embolus. 2. Small pleural effusions. Mild airspace disease at left lung base,  favor atelectasis and or scar over pneumonia 3. No CT evidence for acute intra-abdominal or pelvic abnormality. Atrophic kidneys without hydronephrosis. 4. 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 Electronically Signed   By: Donavan Foil M.D.   On: 06/06/2022 19:47   DG Abd 2 Views  Result Date: 06/04/2022 CLINICAL DATA:  Abdominal pain EXAM: ABDOMEN - 2 VIEW COMPARISON:  01/27/2007 FINDINGS: Bowel gas pattern is nonspecific. There is presence of gas in few slightly dilated small bowel loops. Stomach is not distended. Gas and stool are present in colon. There is no fecal impaction in rectum. There is no pneumoperitoneum. Extensive arterial calcifications are noted. There is vertebroplasty in L3 vertebra. There is decrease in height of multiple lumbar vertebral bodies. There is previous internal fixation in right femur. Surgical clips are seen in the para-aortic region and left inguinal region. IMPRESSION: Presence of gas in few slightly dilated small bowel loops may suggest  ileus. There is no definite evidence of intestinal obstruction or pneumoperitoneum. Other findings as described in the body of the report. Electronically Signed   By: Elmer Picker M.D.   On: 06/04/2022 17:30   CT HEAD WO CONTRAST (5MM)  Result Date: 05/24/2022 CLINICAL DATA:  Follow-up subdural hematoma EXAM: CT HEAD WITHOUT CONTRAST TECHNIQUE: Contiguous axial images were obtained from the base of the skull through the vertex without intravenous contrast. RADIATION DOSE REDUCTION: This exam was performed according to the departmental dose-optimization program which includes automated exposure control, adjustment of the mA and/or kV according to patient size and/or use of iterative reconstruction technique. COMPARISON:  CT head 04/09/2022 FINDINGS: Brain: Bilateral subdural collections are  again seen measuring up to 6 mm over the left frontal lobe and 3 mm over the right frontal lobe, unchanged in size or appearance compared to the study from 04/09/2022. The collections are predominantly hypodense with no evidence of acute blood products. There is no mass effect on the underlying brain parenchyma or midline shift. There is no new acute intracranial hemorrhage or extra-axial fluid collection. There is no acute territorial infarct The ventricles are stable in size. Gray-white differentiation is preserved. There is no mass lesion. Vascular: There is calcification of the bilateral carotid siphons. Skull: Bilateral burr holes are noted. There is no suspicious osseous lesion. Sinuses/Orbits: The paranasal sinuses are clear. Bilateral lens implants are in place. The globes and orbits are otherwise unremarkable. Other: None. IMPRESSION: Stable left larger than right subdural collections since 04/09/2022 with no evidence of new acute blood products or mass effect on the underlying brain parenchyma. Electronically Signed   By: Valetta Mole M.D.   On: 05/24/2022 15:14     Discharge Exam: Vitals:   06/16/22 1957 06/17/22 0433  BP: (!) 138/54 126/76  Pulse: 61   Resp: 19 (!) 21  Temp: (!) 97.4 F (36.3 C) 98.2 F (36.8 C)  SpO2: 98% 99%   Vitals:   06/16/22 1206 06/16/22 1957 06/17/22 0433 06/17/22 0513  BP: (!) 142/50 (!) 138/54 126/76   Pulse: 63 61    Resp: 16 19 (!) 21   Temp: 98 F (36.7 C) (!) 97.4 F (36.3 C) 98.2 F (36.8 C)   TempSrc: Oral Oral Oral   SpO2: 97% 98% 99%   Weight:    69.7 kg  Height:        General: Pt is alert, awake, not in acute distress Cardiovascular: RRR, S1/S2 +, no rubs, no gallops Respiratory: CTA bilaterally, no wheezing, no rhonchi Abdominal: Soft, NT, ND, bowel sounds + Extremities: no edema, no cyanosis    The results of significant diagnostics from this hospitalization (including imaging, microbiology, ancillary and laboratory) are listed  below for reference.     Microbiology: No results found for this or any previous visit (from the past 240 hour(s)).   Labs: BNP (last 3 results) Recent Labs    06/06/22 2010  BNP 5,701.7*   Basic Metabolic Panel: Recent Labs  Lab 06/14/22 1350 06/14/22 1931 06/15/22 0451 06/16/22 0736 06/17/22 0647  NA 139  --  141 141 141  K 4.1  --  3.9 3.8 3.4*  CL 95*  --  95* 101 112*  CO2 32  --  32 31 22  GLUCOSE 109*  --  89 86 85  BUN 19  --  24* 28* 13  CREATININE 5.63* 6.20* 6.87* 6.91* 0.95  CALCIUM 9.2  --  9.0 8.3* 8.2*  MG  --   --  2.2 2.2 2.1  PHOS  --   --  4.7* 3.3 3.1   Liver Function Tests: Recent Labs  Lab 06/14/22 1350 06/15/22 0451 06/16/22 0736 06/17/22 0647  AST _0 12*  ALT _1 ALKPHOS 115 105 110 34*  BILITOT 0.2* 0.3 0.4 0.8  PROT 5.9* 5.4* 5.6* 5.4*  ALBUMIN 2.9* 2.7* 2.8* 3.0*   Recent Labs  Lab 06/14/22 1350  LIPASE 39   No results for input(s): "AMMONIA" in the last 168 hours. CBC: Recent Labs  Lab 06/14/22 1350 06/15/22 0451 06/16/22 0736 06/17/22 0647  WBC 7.3 4.4 5.3 4.4  NEUTROABS 4.9  --  2.4 2.0  HGB 8.8* 8.3* 8.3* 12.0*  HCT 28.0* 26.1* 25.6* 35.1*  MCV 104.9* 102.4* 101.6* 95.9  PLT 213 193 199 155   Cardiac Enzymes: No results for input(s): "CKTOTAL", "CKMB", "CKMBINDEX", "TROPONINI" in the last 168 hours. BNP: Invalid input(s): "POCBNP" CBG: Recent Labs  Lab 06/15/22 2116 06/16/22 1251 06/16/22 1625 06/16/22 2131 06/17/22 0613  GLUCAP 81 77 100* 144* 98   D-Dimer No results for input(s): "DDIMER" in the last 72 hours. Hgb A1c No results for input(s): "HGBA1C" in the last 72 hours. Lipid Profile No results for input(s): "CHOL", "HDL", "LDLCALC", "TRIG", "CHOLHDL", "LDLDIRECT" in the last 72 hours. Thyroid function studies No results for input(s): "TSH", "T4TOTAL", "T3FREE", "THYROIDAB" in the last 72 hours.  Invalid input(s): "FREET3" Anemia work up No results for input(s): "VITAMINB12",  "FOLATE", "FERRITIN", "TIBC", "IRON", "RETICCTPCT" in the last 72 hours. Urinalysis    Component Value Date/Time   COLORURINE AMBER (A) 04/09/2022 2051   APPEARANCEUR CLOUDY (A) 04/09/2022 2051   LABSPEC 1.013 04/09/2022 2051   PHURINE 8.0 04/09/2022 2051   GLUCOSEU NEGATIVE 04/09/2022 2051   HGBUR SMALL (A) 04/09/2022 2051   BILIRUBINUR NEGATIVE 04/09/2022 2051   BILIRUBINUR neg 02/14/2015 1115   KETONESUR NEGATIVE 04/09/2022 2051   PROTEINUR >=300 (A) 04/09/2022 2051   UROBILINOGEN 0.2 02/20/2015 0944   NITRITE NEGATIVE 04/09/2022 2051   LEUKOCYTESUR LARGE (A) 04/09/2022 2051   Sepsis Labs Recent Labs  Lab 06/14/22 1350 06/15/22 0451 06/16/22 0736 06/17/22 0647  WBC 7.3 4.4 5.3 4.4   Microbiology No results found for this or any previous visit (from the past 240 hour(s)).   Time coordinating discharge: Over 30 minutes  SIGNED:   Darliss Cheney, MD  [facility] 06/17/2022, 8:53 AM *Please note that this is a verbal dictation therefore any spelling or grammatical errors are due to the "[employer]" system interpretation. If 7PM-7AM, please contact night-coverage [email or web address]

## 2022-06-18 ENCOUNTER — Telehealth: Payer: Self-pay | Admitting: Internal Medicine

## 2022-06-18 DIAGNOSIS — D631 Anemia in chronic kidney disease: Secondary | ICD-10-CM | POA: Diagnosis not present

## 2022-06-18 DIAGNOSIS — R52 Pain, unspecified: Secondary | ICD-10-CM | POA: Diagnosis not present

## 2022-06-18 DIAGNOSIS — M6281 Muscle weakness (generalized): Secondary | ICD-10-CM | POA: Diagnosis not present

## 2022-06-18 DIAGNOSIS — N186 End stage renal disease: Secondary | ICD-10-CM | POA: Diagnosis not present

## 2022-06-18 DIAGNOSIS — L8990 Pressure ulcer of unspecified site, unspecified stage: Secondary | ICD-10-CM | POA: Diagnosis not present

## 2022-06-18 DIAGNOSIS — Z9181 History of falling: Secondary | ICD-10-CM | POA: Diagnosis not present

## 2022-06-18 DIAGNOSIS — D638 Anemia in other chronic diseases classified elsewhere: Secondary | ICD-10-CM | POA: Diagnosis not present

## 2022-06-18 DIAGNOSIS — N2581 Secondary hyperparathyroidism of renal origin: Secondary | ICD-10-CM | POA: Diagnosis not present

## 2022-06-18 DIAGNOSIS — E669 Obesity, unspecified: Secondary | ICD-10-CM | POA: Diagnosis not present

## 2022-06-18 DIAGNOSIS — M109 Gout, unspecified: Secondary | ICD-10-CM | POA: Diagnosis not present

## 2022-06-18 DIAGNOSIS — Z992 Dependence on renal dialysis: Secondary | ICD-10-CM | POA: Diagnosis not present

## 2022-06-18 DIAGNOSIS — E876 Hypokalemia: Secondary | ICD-10-CM | POA: Diagnosis not present

## 2022-06-18 NOTE — Telephone Encounter (Signed)
Faxed completed signed orders to [facility] [phone], phone [phone]  Certification period 06/12/2022 to 08/10/2022  Order # [id number]

## 2022-06-18 NOTE — Telephone Encounter (Signed)
This message was sent via [place], a product from [employer] [email or web address]://www.[email or web address]/                    -------Fax Transmission Report-------  To:               Recipient at [phone] Subject:          FW: Hp Scans Result:           The transmission was successful. Explanation:      All Pages Ok Pages Sent:       8 Connect Time:     5 minutes, 35 seconds Transmit Time:    06/18/2022 14:52 Transfer Rate:    14400 Status Code:      0000 Retry Count:      0 Job Id:           [id number] Unique Id:        [id number] Fax Line:         47 Fax Server:       [id number]

## 2022-06-19 ENCOUNTER — Inpatient Hospital Stay: Payer: Medicare Other | Admitting: Internal Medicine

## 2022-06-19 ENCOUNTER — Other Ambulatory Visit: Payer: Self-pay

## 2022-06-19 NOTE — Patient Outreach (Signed)
[name] 08-05-1941 [id number]  Referral:  [facility] Readmission Report, post hospital review  [facility] [facility] [[facility]] Patient: Medicare [place]  Primary Care Provider: Elby Showers, MD   Referral received [facility] Readmission Report today.   Patient is in the [facility] of the  [place]. Electronic medical record reveals patient transition to home on 06/17/22.  Call placed as indicated in the electronic medical record.  This Probation officer was able to speak with patient's wife [name], HIPPA verified with two identifiers. Offered [facility] Care Coordination services for readmission prevention. Wife declines any care coordination needs..  Wife states she has [place] Management information and will call if their needs changes.   For questions,  please contact:   Natividad Brood, RN BSN [place]  [phone] business mobile phone Toll free office [phone]  *[place]  [phone] Fax number: [phone] [email or web address]

## 2022-06-20 ENCOUNTER — Inpatient Hospital Stay (HOSPITAL_COMMUNITY)
Admission: EM | Admit: 2022-06-20 | Discharge: 2022-06-24 | DRG: 177 | Disposition: A | Discharge status: Home Health | Payer: Medicare Other | Attending: Family Medicine | Admitting: Family Medicine

## 2022-06-20 ENCOUNTER — Encounter (HOSPITAL_COMMUNITY): Payer: Self-pay | Admitting: Emergency Medicine

## 2022-06-20 ENCOUNTER — Other Ambulatory Visit: Payer: Self-pay

## 2022-06-20 DIAGNOSIS — E039 Hypothyroidism, unspecified: Secondary | ICD-10-CM | POA: Diagnosis present

## 2022-06-20 DIAGNOSIS — D696 Thrombocytopenia, unspecified: Secondary | ICD-10-CM | POA: Diagnosis not present

## 2022-06-20 DIAGNOSIS — F0280 Dementia in other diseases classified elsewhere without behavioral disturbance: Secondary | ICD-10-CM | POA: Diagnosis present

## 2022-06-20 DIAGNOSIS — I132 Hypertensive heart and chronic kidney disease with heart failure and with stage 5 chronic kidney disease, or end stage renal disease: Secondary | ICD-10-CM | POA: Diagnosis present

## 2022-06-20 DIAGNOSIS — E119 Type 2 diabetes mellitus without complications: Secondary | ICD-10-CM | POA: Diagnosis not present

## 2022-06-20 DIAGNOSIS — N2581 Secondary hyperparathyroidism of renal origin: Secondary | ICD-10-CM | POA: Diagnosis not present

## 2022-06-20 DIAGNOSIS — R059 Cough, unspecified: Secondary | ICD-10-CM | POA: Diagnosis not present

## 2022-06-20 DIAGNOSIS — N186 End stage renal disease: Secondary | ICD-10-CM | POA: Diagnosis not present

## 2022-06-20 DIAGNOSIS — R1084 Generalized abdominal pain: Secondary | ICD-10-CM | POA: Diagnosis not present

## 2022-06-20 DIAGNOSIS — E782 Mixed hyperlipidemia: Secondary | ICD-10-CM | POA: Diagnosis present

## 2022-06-20 DIAGNOSIS — E1122 Type 2 diabetes mellitus with diabetic chronic kidney disease: Secondary | ICD-10-CM | POA: Diagnosis not present

## 2022-06-20 DIAGNOSIS — R531 Weakness: Secondary | ICD-10-CM | POA: Diagnosis not present

## 2022-06-20 DIAGNOSIS — Z992 Dependence on renal dialysis: Secondary | ICD-10-CM | POA: Diagnosis not present

## 2022-06-20 DIAGNOSIS — Z823 Family history of stroke: Secondary | ICD-10-CM

## 2022-06-20 DIAGNOSIS — D631 Anemia in chronic kidney disease: Secondary | ICD-10-CM | POA: Diagnosis present

## 2022-06-20 DIAGNOSIS — Z66 Do not resuscitate: Secondary | ICD-10-CM | POA: Diagnosis present

## 2022-06-20 DIAGNOSIS — F0390 Unspecified dementia without behavioral disturbance: Secondary | ICD-10-CM | POA: Diagnosis present

## 2022-06-20 DIAGNOSIS — Z6823 Body mass index (BMI) 23.0-23.9, adult: Secondary | ICD-10-CM

## 2022-06-20 DIAGNOSIS — E038 Other specified hypothyroidism: Secondary | ICD-10-CM | POA: Diagnosis not present

## 2022-06-20 DIAGNOSIS — Z79899 Other long term (current) drug therapy: Secondary | ICD-10-CM

## 2022-06-20 DIAGNOSIS — Z87891 Personal history of nicotine dependence: Secondary | ICD-10-CM

## 2022-06-20 DIAGNOSIS — I5042 Chronic combined systolic (congestive) and diastolic (congestive) heart failure: Secondary | ICD-10-CM | POA: Diagnosis present

## 2022-06-20 DIAGNOSIS — G309 Alzheimer's disease, unspecified: Secondary | ICD-10-CM | POA: Diagnosis present

## 2022-06-20 DIAGNOSIS — J9811 Atelectasis: Secondary | ICD-10-CM | POA: Diagnosis not present

## 2022-06-20 DIAGNOSIS — G8929 Other chronic pain: Secondary | ICD-10-CM | POA: Diagnosis present

## 2022-06-20 DIAGNOSIS — Z888 Allergy status to other drugs, medicaments and biological substances status: Secondary | ICD-10-CM

## 2022-06-20 DIAGNOSIS — E440 Moderate protein-calorie malnutrition: Secondary | ICD-10-CM | POA: Diagnosis present

## 2022-06-20 DIAGNOSIS — E1169 Type 2 diabetes mellitus with other specified complication: Secondary | ICD-10-CM | POA: Diagnosis not present

## 2022-06-20 DIAGNOSIS — R9431 Abnormal electrocardiogram [ECG] [EKG]: Secondary | ICD-10-CM | POA: Diagnosis not present

## 2022-06-20 DIAGNOSIS — R7989 Other specified abnormal findings of blood chemistry: Secondary | ICD-10-CM | POA: Diagnosis not present

## 2022-06-20 DIAGNOSIS — J90 Pleural effusion, not elsewhere classified: Secondary | ICD-10-CM | POA: Diagnosis not present

## 2022-06-20 DIAGNOSIS — R000 Tachycardia, unspecified: Secondary | ICD-10-CM | POA: Diagnosis not present

## 2022-06-20 DIAGNOSIS — M109 Gout, unspecified: Secondary | ICD-10-CM | POA: Diagnosis present

## 2022-06-20 DIAGNOSIS — R54 Age-related physical debility: Secondary | ICD-10-CM | POA: Diagnosis present

## 2022-06-20 DIAGNOSIS — I1311 Hypertensive heart and chronic kidney disease without heart failure, with stage 5 chronic kidney disease, or end stage renal disease: Secondary | ICD-10-CM | POA: Diagnosis not present

## 2022-06-20 DIAGNOSIS — G3184 Mild cognitive impairment, so stated: Secondary | ICD-10-CM | POA: Diagnosis not present

## 2022-06-20 DIAGNOSIS — I959 Hypotension, unspecified: Secondary | ICD-10-CM | POA: Diagnosis not present

## 2022-06-20 DIAGNOSIS — M898X9 Other specified disorders of bone, unspecified site: Secondary | ICD-10-CM | POA: Diagnosis not present

## 2022-06-20 DIAGNOSIS — M549 Dorsalgia, unspecified: Secondary | ICD-10-CM | POA: Diagnosis not present

## 2022-06-20 DIAGNOSIS — K219 Gastro-esophageal reflux disease without esophagitis: Secondary | ICD-10-CM | POA: Diagnosis present

## 2022-06-20 DIAGNOSIS — U071 COVID-19: Principal | ICD-10-CM | POA: Diagnosis present

## 2022-06-20 DIAGNOSIS — Z79890 Hormone replacement therapy (postmenopausal): Secondary | ICD-10-CM

## 2022-06-20 DIAGNOSIS — E1129 Type 2 diabetes mellitus with other diabetic kidney complication: Secondary | ICD-10-CM | POA: Diagnosis not present

## 2022-06-20 DIAGNOSIS — Z833 Family history of diabetes mellitus: Secondary | ICD-10-CM

## 2022-06-20 DIAGNOSIS — Z885 Allergy status to narcotic agent status: Secondary | ICD-10-CM

## 2022-06-20 DIAGNOSIS — I480 Paroxysmal atrial fibrillation: Secondary | ICD-10-CM | POA: Diagnosis not present

## 2022-06-20 DIAGNOSIS — M545 Low back pain, unspecified: Secondary | ICD-10-CM | POA: Diagnosis present

## 2022-06-20 DIAGNOSIS — R609 Edema, unspecified: Secondary | ICD-10-CM | POA: Diagnosis not present

## 2022-06-20 DIAGNOSIS — N250 Renal osteodystrophy: Secondary | ICD-10-CM | POA: Diagnosis not present

## 2022-06-20 DIAGNOSIS — I2510 Atherosclerotic heart disease of native coronary artery without angina pectoris: Secondary | ICD-10-CM | POA: Diagnosis present

## 2022-06-20 DIAGNOSIS — Z8249 Family history of ischemic heart disease and other diseases of the circulatory system: Secondary | ICD-10-CM

## 2022-06-20 DIAGNOSIS — E876 Hypokalemia: Secondary | ICD-10-CM | POA: Diagnosis not present

## 2022-06-20 NOTE — ED Triage Notes (Signed)
Pt BIB [facility] from home, pt with hx dementia, per EMS, pt's wife reports pt has had increased weakness/malaise, generalized pain and productive cough. MWF dialysis, pt to his neuro baseline. EMS VSS, CBG 157

## 2022-06-21 ENCOUNTER — Encounter (HOSPITAL_COMMUNITY): Payer: Self-pay | Admitting: Internal Medicine

## 2022-06-21 ENCOUNTER — Emergency Department (HOSPITAL_COMMUNITY): Payer: Medicare Other

## 2022-06-21 DIAGNOSIS — J90 Pleural effusion, not elsewhere classified: Secondary | ICD-10-CM | POA: Diagnosis not present

## 2022-06-21 DIAGNOSIS — J9811 Atelectasis: Secondary | ICD-10-CM | POA: Diagnosis not present

## 2022-06-21 DIAGNOSIS — F0280 Dementia in other diseases classified elsewhere without behavioral disturbance: Secondary | ICD-10-CM | POA: Diagnosis present

## 2022-06-21 DIAGNOSIS — E119 Type 2 diabetes mellitus without complications: Secondary | ICD-10-CM | POA: Diagnosis not present

## 2022-06-21 DIAGNOSIS — E039 Hypothyroidism, unspecified: Secondary | ICD-10-CM | POA: Diagnosis present

## 2022-06-21 DIAGNOSIS — G3184 Mild cognitive impairment, so stated: Secondary | ICD-10-CM | POA: Diagnosis not present

## 2022-06-21 DIAGNOSIS — M109 Gout, unspecified: Secondary | ICD-10-CM | POA: Diagnosis present

## 2022-06-21 DIAGNOSIS — E1169 Type 2 diabetes mellitus with other specified complication: Secondary | ICD-10-CM | POA: Diagnosis present

## 2022-06-21 DIAGNOSIS — R54 Age-related physical debility: Secondary | ICD-10-CM | POA: Diagnosis present

## 2022-06-21 DIAGNOSIS — R531 Weakness: Secondary | ICD-10-CM | POA: Diagnosis present

## 2022-06-21 DIAGNOSIS — I480 Paroxysmal atrial fibrillation: Secondary | ICD-10-CM | POA: Diagnosis present

## 2022-06-21 DIAGNOSIS — Z66 Do not resuscitate: Secondary | ICD-10-CM | POA: Diagnosis present

## 2022-06-21 DIAGNOSIS — E1129 Type 2 diabetes mellitus with other diabetic kidney complication: Secondary | ICD-10-CM | POA: Diagnosis not present

## 2022-06-21 DIAGNOSIS — I2510 Atherosclerotic heart disease of native coronary artery without angina pectoris: Secondary | ICD-10-CM | POA: Diagnosis present

## 2022-06-21 DIAGNOSIS — I1311 Hypertensive heart and chronic kidney disease without heart failure, with stage 5 chronic kidney disease, or end stage renal disease: Secondary | ICD-10-CM | POA: Diagnosis not present

## 2022-06-21 DIAGNOSIS — D696 Thrombocytopenia, unspecified: Secondary | ICD-10-CM | POA: Diagnosis not present

## 2022-06-21 DIAGNOSIS — R9431 Abnormal electrocardiogram [ECG] [EKG]: Secondary | ICD-10-CM | POA: Diagnosis not present

## 2022-06-21 DIAGNOSIS — E1122 Type 2 diabetes mellitus with diabetic chronic kidney disease: Secondary | ICD-10-CM | POA: Diagnosis present

## 2022-06-21 DIAGNOSIS — G309 Alzheimer's disease, unspecified: Secondary | ICD-10-CM | POA: Diagnosis present

## 2022-06-21 DIAGNOSIS — E440 Moderate protein-calorie malnutrition: Secondary | ICD-10-CM | POA: Diagnosis present

## 2022-06-21 DIAGNOSIS — N250 Renal osteodystrophy: Secondary | ICD-10-CM | POA: Diagnosis not present

## 2022-06-21 DIAGNOSIS — F0390 Unspecified dementia without behavioral disturbance: Secondary | ICD-10-CM | POA: Diagnosis not present

## 2022-06-21 DIAGNOSIS — R7989 Other specified abnormal findings of blood chemistry: Secondary | ICD-10-CM | POA: Diagnosis not present

## 2022-06-21 DIAGNOSIS — N186 End stage renal disease: Secondary | ICD-10-CM | POA: Diagnosis present

## 2022-06-21 DIAGNOSIS — U071 COVID-19: Secondary | ICD-10-CM | POA: Diagnosis present

## 2022-06-21 DIAGNOSIS — E782 Mixed hyperlipidemia: Secondary | ICD-10-CM | POA: Diagnosis present

## 2022-06-21 DIAGNOSIS — Z992 Dependence on renal dialysis: Secondary | ICD-10-CM | POA: Diagnosis not present

## 2022-06-21 DIAGNOSIS — N2581 Secondary hyperparathyroidism of renal origin: Secondary | ICD-10-CM | POA: Diagnosis present

## 2022-06-21 DIAGNOSIS — G8929 Other chronic pain: Secondary | ICD-10-CM | POA: Diagnosis present

## 2022-06-21 DIAGNOSIS — R609 Edema, unspecified: Secondary | ICD-10-CM | POA: Diagnosis not present

## 2022-06-21 DIAGNOSIS — I959 Hypotension, unspecified: Secondary | ICD-10-CM | POA: Diagnosis present

## 2022-06-21 DIAGNOSIS — E038 Other specified hypothyroidism: Secondary | ICD-10-CM | POA: Diagnosis not present

## 2022-06-21 DIAGNOSIS — D631 Anemia in chronic kidney disease: Secondary | ICD-10-CM | POA: Diagnosis present

## 2022-06-21 DIAGNOSIS — I132 Hypertensive heart and chronic kidney disease with heart failure and with stage 5 chronic kidney disease, or end stage renal disease: Secondary | ICD-10-CM | POA: Diagnosis present

## 2022-06-21 DIAGNOSIS — Z87891 Personal history of nicotine dependence: Secondary | ICD-10-CM | POA: Diagnosis not present

## 2022-06-21 DIAGNOSIS — I5042 Chronic combined systolic (congestive) and diastolic (congestive) heart failure: Secondary | ICD-10-CM | POA: Diagnosis present

## 2022-06-21 LAB — COMPREHENSIVE METABOLIC PANEL
ALT: 14 U/L (ref 0–44)
AST: 35 U/L (ref 15–41)
Albumin: 2.8 g/dL — ABNORMAL LOW (ref 3.5–5.0)
Alkaline Phosphatase: 115 U/L (ref 38–126)
Anion gap: 13 (ref 5–15)
BUN: 13 mg/dL (ref 8–23)
CO2: 31 mmol/L (ref 22–32)
Calcium: 9.1 mg/dL (ref 8.9–10.3)
Chloride: 97 mmol/L — ABNORMAL LOW (ref 98–111)
Creatinine, Ser: 4.84 mg/dL — ABNORMAL HIGH (ref 0.61–1.24)
GFR, Estimated: 11 mL/min — ABNORMAL LOW (ref >60)
Glucose, Bld: 135 mg/dL — ABNORMAL HIGH (ref 70–99)
Potassium: 3.6 mmol/L (ref 3.5–5.1)
Sodium: 141 mmol/L (ref 135–145)
Total Bilirubin: 0.4 mg/dL (ref 0.3–1.2)
Total Protein: 5.6 g/dL — ABNORMAL LOW (ref 6.5–8.1)

## 2022-06-21 LAB — CBC WITH DIFFERENTIAL/PLATELET
Abs Immature Granulocytes: 0.04 10*3/uL (ref 0.00–0.07)
Basophils Absolute: 0.1 10*3/uL (ref 0.0–0.1)
Basophils Relative: 1 %
Eosinophils Absolute: 0.1 10*3/uL (ref 0.0–0.5)
Eosinophils Relative: 1 %
HCT: 25.7 % — ABNORMAL LOW (ref 39.0–52.0)
Hemoglobin: 8.2 g/dL — ABNORMAL LOW (ref 13.0–17.0)
Immature Granulocytes: 1 %
Lymphocytes Relative: 17 %
Lymphs Abs: 1.1 10*3/uL (ref 0.7–4.0)
MCH: 32.9 pg (ref 26.0–34.0)
MCHC: 31.9 g/dL (ref 30.0–36.0)
MCV: 103.2 fL — ABNORMAL HIGH (ref 80.0–100.0)
Monocytes Absolute: 0.8 10*3/uL (ref 0.1–1.0)
Monocytes Relative: 12 %
Neutro Abs: 4.5 10*3/uL (ref 1.7–7.7)
Neutrophils Relative %: 68 %
Platelets: 129 10*3/uL — ABNORMAL LOW (ref 150–400)
RBC: 2.49 MIL/uL — ABNORMAL LOW (ref 4.22–5.81)
RDW: 18.6 % — ABNORMAL HIGH (ref 11.5–15.5)
WBC: 6.4 10*3/uL (ref 4.0–10.5)
nRBC: 0.8 % — ABNORMAL HIGH (ref 0.0–0.2)

## 2022-06-21 LAB — LACTATE DEHYDROGENASE: LDH: 147 U/L (ref 98–192)

## 2022-06-21 LAB — CBG MONITORING, ED
Glucose-Capillary: 85 mg/dL (ref 70–99)
Glucose-Capillary: 76 mg/dL (ref 70–99)

## 2022-06-21 LAB — GLUCOSE, CAPILLARY: Glucose-Capillary: 96 mg/dL (ref 70–99)

## 2022-06-21 LAB — FIBRINOGEN: Fibrinogen: 389 mg/dL (ref 210–475)

## 2022-06-21 LAB — RESP PANEL BY RT-PCR (FLU A&B, COVID) ARPGX2
Influenza A by PCR: NEGATIVE
Influenza B by PCR: NEGATIVE
SARS Coronavirus 2 by RT PCR: POSITIVE — AB

## 2022-06-21 LAB — C-REACTIVE PROTEIN: CRP: 4.7 mg/dL — ABNORMAL HIGH (ref <1.0)

## 2022-06-21 LAB — D-DIMER, QUANTITATIVE: D-Dimer, Quant: 5.03 ug/mL-FEU — ABNORMAL HIGH (ref 0.00–0.50)

## 2022-06-21 LAB — FERRITIN: Ferritin: 388 ng/mL — ABNORMAL HIGH (ref 24–336)

## 2022-06-21 LAB — LIPASE, BLOOD: Lipase: 47 U/L (ref 11–51)

## 2022-06-21 LAB — BRAIN NATRIURETIC PEPTIDE: B Natriuretic Peptide: 1674.6 pg/mL — ABNORMAL HIGH (ref 0.0–100.0)

## 2022-06-21 LAB — PROCALCITONIN: Procalcitonin: 1.54 ng/mL

## 2022-06-21 MED ORDER — IPRATROPIUM BROMIDE HFA 17 MCG/ACT IN AERS
2.0000 | INHALATION_SPRAY | Freq: Four times a day (QID) | RESPIRATORY_TRACT | Status: DC
  Administered 2022-06-21 – 2022-06-24 (×11): 2 via RESPIRATORY_TRACT
  Filled 2022-06-21 (×2): qty 12.9

## 2022-06-21 MED ORDER — HYDROCOD POLI-CHLORPHE POLI ER 10-8 MG/5ML PO SUER: 5.0000 mL | Freq: Two times a day (BID) | ORAL | Status: DC | PRN

## 2022-06-21 MED ORDER — PANTOPRAZOLE SODIUM 40 MG PO TBEC
40.0000 mg | DELAYED_RELEASE_TABLET | Freq: Every day | ORAL | Status: DC
  Administered 2022-06-21 – 2022-06-24 (×4): 40 mg via ORAL
  Filled 2022-06-21 (×4): qty 1

## 2022-06-21 MED ORDER — NEPRO/CARBSTEADY PO LIQD
237.0000 mL | Freq: Every morning | ORAL | Status: DC
  Administered 2022-06-21 – 2022-06-22 (×2): 237 mL via ORAL
  Filled 2022-06-21: qty 237

## 2022-06-21 MED ORDER — ALLOPURINOL 100 MG PO TABS
100.0000 mg | ORAL_TABLET | Freq: Every day | ORAL | Status: DC
  Administered 2022-06-21 – 2022-06-24 (×4): 100 mg via ORAL
  Filled 2022-06-21 (×4): qty 1

## 2022-06-21 MED ORDER — MIDODRINE HCL 5 MG PO TABS: 10.0000 mg | ORAL_TABLET | ORAL | Status: DC

## 2022-06-21 MED ORDER — SORBITOL 70 % SOLN: 30.0000 mL | Status: DC | PRN

## 2022-06-21 MED ORDER — METHYLPREDNISOLONE SODIUM SUCC 125 MG IJ SOLR
1.0000 mg/kg | Freq: Two times a day (BID) | INTRAMUSCULAR | Status: DC
  Administered 2022-06-21 – 2022-06-22 (×3): 70 mg via INTRAVENOUS
  Filled 2022-06-21 (×3): qty 2

## 2022-06-21 MED ORDER — HEPARIN SODIUM (PORCINE) 5000 UNIT/ML IJ SOLN
5000.0000 [IU] | Freq: Three times a day (TID) | INTRAMUSCULAR | Status: DC
  Administered 2022-06-21 – 2022-06-24 (×8): 5000 [IU] via SUBCUTANEOUS
  Filled 2022-06-21 (×8): qty 1

## 2022-06-21 MED ORDER — CHLORHEXIDINE GLUCONATE CLOTH 2 % EX PADS
6.0000 | MEDICATED_PAD | Freq: Every day | CUTANEOUS | Status: DC
  Administered 2022-06-22 – 2022-06-24 (×3): 6 via TOPICAL

## 2022-06-21 MED ORDER — DOXERCALCIFEROL 4 MCG/2ML IV SOLN
9.0000 ug | INTRAVENOUS | Status: DC
  Administered 2022-06-22: 9 ug via INTRAVENOUS
  Filled 2022-06-21 (×2): qty 6

## 2022-06-21 MED ORDER — LEVOTHYROXINE SODIUM 50 MCG PO TABS
50.0000 ug | ORAL_TABLET | Freq: Every day | ORAL | Status: DC
  Administered 2022-06-22 – 2022-06-24 (×3): 50 ug via ORAL
  Filled 2022-06-21 (×3): qty 1

## 2022-06-21 MED ORDER — HYDRALAZINE HCL 20 MG/ML IJ SOLN: 5.0000 mg | INTRAMUSCULAR | Status: DC | PRN

## 2022-06-21 MED ORDER — ACETAMINOPHEN 325 MG PO TABS: 650.0000 mg | ORAL_TABLET | Freq: Four times a day (QID) | ORAL | Status: DC | PRN

## 2022-06-21 MED ORDER — PREDNISONE 20 MG PO TABS: 50.0000 mg | ORAL_TABLET | Freq: Every day | ORAL | Status: DC

## 2022-06-21 MED ORDER — DOCUSATE SODIUM 283 MG RE ENEM: 1.0000 | ENEMA | RECTAL | Status: DC | PRN

## 2022-06-21 MED ORDER — MIDODRINE HCL 5 MG PO TABS
10.0000 mg | ORAL_TABLET | ORAL | Status: DC
  Administered 2022-06-23: 10 mg via ORAL
  Filled 2022-06-21: qty 2

## 2022-06-21 MED ORDER — ACETAMINOPHEN 650 MG RE SUPP: 650.0000 mg | Freq: Four times a day (QID) | RECTAL | Status: DC | PRN

## 2022-06-21 MED ORDER — GUAIFENESIN-DM 100-10 MG/5ML PO SYRP: 10.0000 mL | ORAL_SOLUTION | ORAL | Status: DC | PRN

## 2022-06-21 MED ORDER — MEMANTINE HCL 10 MG PO TABS
10.0000 mg | ORAL_TABLET | Freq: Two times a day (BID) | ORAL | Status: DC
  Administered 2022-06-21 – 2022-06-24 (×6): 10 mg via ORAL
  Filled 2022-06-21 (×6): qty 1

## 2022-06-21 MED ORDER — CAMPHOR-MENTHOL 0.5-0.5 % EX LOTN: 1.0000 | TOPICAL_LOTION | Freq: Three times a day (TID) | CUTANEOUS | Status: DC | PRN

## 2022-06-21 MED ORDER — CARBOXYMETHYLCELLULOSE SODIUM 0.25 % OP SOLN: 1.0000 [drp] | Freq: Three times a day (TID) | OPHTHALMIC | Status: DC | PRN

## 2022-06-21 MED ORDER — INSULIN ASPART 100 UNIT/ML IJ SOLN
0.0000 [IU] | Freq: Three times a day (TID) | INTRAMUSCULAR | Status: DC
  Administered 2022-06-22: 2 [IU] via SUBCUTANEOUS
  Administered 2022-06-23: 1 [IU] via SUBCUTANEOUS
  Administered 2022-06-23: 2 [IU] via SUBCUTANEOUS

## 2022-06-21 MED ORDER — SEVELAMER CARBONATE 800 MG PO TABS
2400.0000 mg | ORAL_TABLET | Freq: Three times a day (TID) | ORAL | Status: DC
  Administered 2022-06-21 – 2022-06-24 (×10): 2400 mg via ORAL
  Filled 2022-06-21 (×11): qty 3

## 2022-06-21 MED ORDER — CALCIUM CARBONATE ANTACID 1250 MG/5ML PO SUSP: 500.0000 mg | Freq: Four times a day (QID) | ORAL | Status: DC | PRN

## 2022-06-21 MED ORDER — INSULIN ASPART 100 UNIT/ML IJ SOLN: 0.0000 [IU] | Freq: Every day | INTRAMUSCULAR | Status: DC

## 2022-06-21 MED ORDER — POLYVINYL ALCOHOL 1.4 % OP SOLN: 1.0000 [drp] | Freq: Three times a day (TID) | OPHTHALMIC | Status: DC | PRN

## 2022-06-21 MED ORDER — NEPRO/CARBSTEADY PO LIQD: 237.0000 mL | Freq: Three times a day (TID) | ORAL | Status: DC | PRN

## 2022-06-21 MED ORDER — MOLNUPIRAVIR EUA 200MG CAPSULE
4.0000 | ORAL_CAPSULE | Freq: Two times a day (BID) | ORAL | Status: DC
  Administered 2022-06-21 – 2022-06-24 (×7): 800 mg via ORAL
  Filled 2022-06-21 (×2): qty 4

## 2022-06-21 MED ORDER — QUETIAPINE FUMARATE 25 MG PO TABS
25.0000 mg | ORAL_TABLET | Freq: Every day | ORAL | Status: DC
  Administered 2022-06-21 – 2022-06-23 (×2): 25 mg via ORAL
  Filled 2022-06-21 (×2): qty 1

## 2022-06-21 MED ORDER — HYDROXYZINE HCL 25 MG PO TABS: 25.0000 mg | ORAL_TABLET | Freq: Three times a day (TID) | ORAL | Status: DC | PRN

## 2022-06-21 MED ORDER — SEVELAMER CARBONATE 800 MG PO TABS
1600.0000 mg | ORAL_TABLET | ORAL | Status: DC
  Administered 2022-06-21 – 2022-06-24 (×7): 1600 mg via ORAL
  Filled 2022-06-21 (×3): qty 2

## 2022-06-21 MED ORDER — ROSUVASTATIN CALCIUM 5 MG PO TABS
10.0000 mg | ORAL_TABLET | Freq: Every day | ORAL | Status: DC
  Administered 2022-06-21 – 2022-06-24 (×4): 10 mg via ORAL
  Filled 2022-06-21 (×4): qty 2

## 2022-06-21 MED ORDER — SEVELAMER CARBONATE 800 MG PO TABS: 1600.0000 mg | ORAL_TABLET | ORAL | Status: DC

## 2022-06-21 NOTE — ED Notes (Signed)
Pt refusing to get up, pt waving staff away. States he cant get up because he doesn't feel well.  Provider aware

## 2022-06-21 NOTE — ED Provider Notes (Signed)
  Physical Exam  BP (!) 122/51   Pulse 64   Temp (!) 97.4 F (36.3 C) (Oral)   Resp 13   SpO2 93%   Physical Exam  Procedures  Procedures  ED Course / MDM   Clinical Course as of 06/21/22 0950  Thu Jun 21, 2022  0720 Assumed care from Dr Ralene Bathe. 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 Has COVID and pt is too weak to go home. Still on RA. Awaiting call from medicine. If medicine rejects will be TOC and PT here.  [RP]  [phone] Patient discussed with Dr. Karmen Bongo for admission. [RP]    Clinical Course User Index [RP] Fransico Meadow, MD   Medical Decision Making Amount and/or Complexity of Data Reviewed Labs: ordered. Radiology: ordered.  Risk Decision regarding hospitalization.     Fransico Meadow, MD 06/21/22 [phone]

## 2022-06-21 NOTE — H&P (Signed)
History and Physical    Patient: [name] XBD:[id number] DOB: May 09, 1941 DOA: 06/20/2022 DOS: the patient was seen and examined on 06/21/2022 PCP: Elby Showers, MD  Patient coming from: Home - lives with wife; NOK: Wife, Tennyson Wacha, [phone]   Chief Complaint: Weakness  HPI: CAPRI RABEN is a 81 y.o. male with medical history significant of dementia; CAD;  DM; ESRD on MWF HD; HTN; HLD; and hypothyroidism presenting with weakness.  He was last admitted from 9/21-24 for afib with RVR.  He provided little information while I was present but does report abdominal pain without n/v and with normal BMs.  +cough.  +weakness - but he would like to go home ASAP.    I spoke with his wife.  He has been doing okay Sunday and Monday.  Starting Tuesday, he started to get sick - very weak, lethargic, would not stand on his own.  His dementia is very bad.  He has a bad and deep cough, sleeping a lot.  No fever.  His wife is also ill.  They have not had COVID yet.  They have been vaccinated x 4.  She spoke with his doctor thinking they might need some rehab.  He is DNR.  He has been complaining of abdominal pain, ?distention - but that has been going on for months. Hgb appears to be stable, but he has not been eating well.    ER Course:  COVID - generalized weakness, cough.  Sats and labs ok.  Too weak to ambulate.       Review of Systems: As mentioned in the history of present illness. All other systems reviewed and are negative.  Limited by dementia.   Past Medical History:  Diagnosis Date   Allergy    Alzheimer's disease ([place]) 01/19/2021   Anemia    Arthritis    Cataract    bil cateracts removed   Coronary artery disease    Diabetes mellitus    Type 2   Diverticulitis    ED (erectile dysfunction)    Elevated homocysteine    ESRD (end stage renal disease) on dialysis ([place]) 03/2015   M-W-F dialysis   GERD (gastroesophageal reflux disease)    pepto    Gout    Hiatal hernia     Hyperlipidemia    Hypertension    Hypothyroidism    PVD (peripheral vascular disease) ([place])    has plastic aorta   Renal insufficiency    Sleep apnea    does not wear c-pap   Past Surgical History:  Procedure Laterality Date   ANGIOPLASTY Right 01/26/2021   Procedure: ANGIOPLASTY RIGHT SUBCLAVIAN VEIN;  Surgeon: Serafina Mitchell, MD;  Location: [place];  Service: Vascular;  Laterality: Right;   aortobifemoral bypass     AV FISTULA PLACEMENT Left 12/01/2013   Procedure: ARTERIOVENOUS (AV) FISTULA CREATION- LEFT BRACHIOCEPHALIC;  Surgeon: Angelia Mould, MD;  Location: [place];  Service: Vascular;  Laterality: Left;   BASCILIC VEIN TRANSPOSITION Right 07/27/2014   Procedure: BASCILIC VEIN TRANSPOSITION;  Surgeon: Angelia Mould, MD;  Location: [place];  Service: Vascular;  Laterality: Right;   BRAIN SURGERY  07/22/2018   BREAST SURGERY     left - granulomatous mastitis   BURR HOLE Bilateral 07/22/2018   Procedure: BILATERAL BURR HOLES;  Surgeon: Kristeen Miss, MD;  Location: [place];  Service: Neurosurgery;  Laterality: Bilateral;   COLONOSCOPY     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 ([place] [facility])  1992   ENTEROSCOPY N/A 06/03/2018   Procedure: ENTEROSCOPY;  Surgeon: Lavena Bullion, DO;  Location: [place];  Service: Gastroenterology;  Laterality: N/A;   ENTEROSCOPY N/A 11/20/2018   Procedure: ENTEROSCOPY;  Surgeon: Rush Landmark Telford Nab., MD;  Location: [place];  Service: Gastroenterology;  Laterality: N/A;   ENTEROSCOPY N/A 12/03/2019   Procedure: ENTEROSCOPY;  Surgeon: Lavena Bullion, DO;  Location: [facility] ENDOSCOPY;  Service: Gastroenterology;  Laterality: N/A;  push enteroscopy   ENTEROSCOPY N/A 07/04/2021   Procedure: ENTEROSCOPY;  Surgeon: Sharyn Creamer, MD;  Location: [facility] ENDOSCOPY;  Service: Gastroenterology;  Laterality: N/A;   ENTEROSCOPY N/A 11/05/2021   Procedure: ENTEROSCOPY;  Surgeon: Daryel November, MD;  Location: [facility] ENDOSCOPY;  Service: Gastroenterology;   Laterality: N/A;   EYE SURGERY Bilateral    cataracts   FISTULOGRAM Right 01/26/2021   Procedure: FISTULOGRAM RIGHT;  Surgeon: Serafina Mitchell, MD;  Location: [facility] OR;  Service: Vascular;  Laterality: Right;   HEMODIALYSIS INPATIENT  01/17/2018       HOT HEMOSTASIS N/A 06/03/2018   Procedure: HOT HEMOSTASIS (ARGON PLASMA COAGULATION/BICAP);  Surgeon: Lavena Bullion, DO;  Location: [facility] ENDOSCOPY;  Service: Gastroenterology;  Laterality: N/A;   HOT HEMOSTASIS N/A 11/20/2018   Procedure: HOT HEMOSTASIS (ARGON PLASMA COAGULATION/BICAP);  Surgeon: Irving Copas., MD;  Location: [place];  Service: Gastroenterology;  Laterality: N/A;   HOT HEMOSTASIS N/A 12/03/2019   Procedure: HOT HEMOSTASIS (ARGON PLASMA COAGULATION/BICAP);  Surgeon: Lavena Bullion, DO;  Location: [facility] ENDOS[facility];  Service: Gastroenterology;  Laterality: N/A;   INTRAMEDULLARY (IM) NAIL INTERTROCHANTERIC Right 11/08/2020   Procedure: INTRAMEDULLARY (IM) NAIL INTERTROCHANTRIC;  Surgeon: Erle Crocker, MD;  Location: [place];  Service: Orthopedics;  Laterality: Right;   REVISION OF ARTERIOVENOUS GORETEX GRAFT Right 09/01/2019   Procedure: REVISION OF ARTERIOVENOUS FISTULA RIGHT ARM;  Surgeon: Serafina Mitchell, MD;  Location: [place];  Service: Vascular;  Laterality: Right;   REVISON OF ARTERIOVENOUS FISTULA Left 02/09/2014   Procedure: REVISON OF LEFT ARTERIOVENOUS FISTULA - RESECTION OF RENDUNDANT VEIN;  Surgeon: Angelia Mould, MD;  Location: [place];  Service: Vascular;  Laterality: Left;   REVISON OF ARTERIOVENOUS FISTULA Right 01/26/2021   Procedure: REVISON OF ARTERIOVENOUS FISTULA RIGHT;  Surgeon: Serafina Mitchell, MD;  Location: [place];  Service: Vascular;  Laterality: Right;   SBO with lysis adhesions     SHUNTOGRAM Left 04/19/2014   Procedure: FISTULOGRAM;  Surgeon: Angelia Mould, MD;  [facility] LAB;  Service: Cardiovascular;  Laterality: Left;   UNILATERAL UPPER EXTREMEITY ANGIOGRAM N/A  07/12/2014   Procedure: UNILATERAL UPPER Anselmo Rod;  Surgeon: Angelia Mould, MD;  Location: [facility] CATH LAB;  Service: Cardiovascular;  Laterality: N/A;   Social History:  reports that he quit smoking about 27 years ago. His smoking use included cigarettes. He has never been exposed to tobacco smoke. He has never used smokeless tobacco. He reports that he does not drink alcohol and does not use drugs.  Allergies  Allergen Reactions   Ambien [Zolpidem Tartrate] Other (See Comments)    Hallucinations and "felt crazy"    Penicillins Hives and Rash    Has patient had a PCN reaction causing immediate rash, facial/tongue/throat swelling, SOB or lightheadedness with hypotension: Yes Has patient had a PCN reaction causing severe rash involving mucus membranes or skin necrosis: Yes Has patient had a PCN reaction that required hospitalization: No Has patient had a PCN reaction occurring within the last 10 years: No If all  of the above answers are "NO", then may proceed with Cephalosporin use.     Family History  Problem Relation Age of Onset   Aneurysm Mother        brain   Heart disease Father    Stroke Father    Hypertension Father    Diabetes Father    Dementia Neg Hx    Colon cancer Neg Hx    Esophageal cancer Neg Hx    Pancreatic cancer Neg Hx    Prostate cancer Neg Hx    Rectal cancer Neg Hx    Stomach cancer Neg Hx     Prior to Admission medications   Medication Sig Start Date End Date Taking? Authorizing Provider  acetaminophen (TYLENOL) 325 MG tablet Take 1-2 tablets (325-650 mg total) by mouth every 4 (four) hours as needed for mild pain. Patient taking differently: Take 650 mg by mouth in the morning and at bedtime. 08/01/18   Love, Ivan Anchors, PA-C  albuterol (VENTOLIN HFA) 108 (90 Base) MCG/ACT inhaler Inhale 1-2 puffs into the lungs daily as needed for wheezing or shortness of breath.    [provider]  allopurinol (ZYLOPRIM) 100 MG tablet Take 100 mg  by mouth daily.    [provider]  Blood Glucose Monitoring Suppl (FREESTYLE FREEDOM LITE) w/Device KIT Use to check blood sugar 2 times per day dx code E11.65 10/21/18   Elayne Snare, MD  camphor-menthol [facility]) lotion Apply 1 application topically 4 (four) times daily as needed for itching.     [provider]  Carboxymethylcellulose Sodium (THERATEARS) 0.25 % SOLN Place 1 drop into both eyes 3 (three) times daily as needed (for dryness).    [provider]  cetirizine (ZYRTEC) 10 MG tablet Take 10 mg by mouth daily as needed for allergies.    [provider]  cyanocobalamin (,VITAMIN B-12,) 1000 MCG/ML injection Inject 1 mL (1,000 mcg total) into the muscle every 30 (thirty) days. 04/13/22   Elby Showers, MD  donepezil (ARICEPT) 10 MG tablet Take 1 tablet (10 mg total) by mouth at bedtime. 09/26/21   Melvenia Beam, MD  doxercalciferol (HECTOROL) 4 MCG/2ML injection Inject 2.5 mLs (5 mcg total) into the vein every Monday, Wednesday, and Friday with hemodialysis. 06/18/22   Allie Bossier, MD  fluticasone (FLONASE) 50 MCG/ACT nasal spray Place 1 spray into both nostrils daily. Patient taking differently: Place 2 sprays into both nostrils daily as needed for allergies. 10/08/14   Mikhail, Maryann, DO  FORTEO 600 MCG/2.4ML SOPN INJECT 20 MCG UNDER THE SKIN DAILY (DISCARD 28 DAYS AFTER INITIAL USE) Patient taking differently: Inject 20 mcg into the skin at bedtime. (DISCARD 28 DAYS AFTER INITIAL USE) 05/14/22   Elayne Snare, MD  glucose blood (FREESTYLE LITE) test strip USE AS DIRECTED THREE TIMES DAILY 02/16/22   Elayne Snare, MD  hydrocortisone 2.5 % cream Apply 1 application topically 3 (three) times daily as needed (skin irritation (legs & arms)).  01/13/19   [provider]  Lancets (FREESTYLE) lancets Use as instructed to check blood sugar 2 times per day dx code E11.65 02/22/16   Elayne Snare, MD  levothyroxine (SYNTHROID, LEVOTHROID) 50 MCG tablet Take 1  tablet (50 mcg total) by mouth daily. Patient taking differently: Take 50 mcg by mouth daily before breakfast. 08/06/14   Elby Showers, MD  memantine (NAMENDA) 10 MG tablet Take 1 tablet (10 mg total) by mouth 2 (two) times daily. 09/26/21   Melvenia Beam, MD  midodrine (PROAMATINE) 10 MG tablet Take one pill prior to hemodialysis on MWF Patient taking differently: Take 10 mg by mouth See admin instructions. Take 10 mg by mouth prior to hemodialysis on Mon/Wed/Fri 08/13/18   Love, Ivan Anchors, PA-C  Nutritional Supplements (FEEDING SUPPLEMENT, NEPRO CARB STEADY,) LIQD Take 237 mLs by mouth in the morning.    [provider]  omeprazole (PRILOSEC) 40 MG capsule Take 1 capsule (40 mg total) by mouth daily. Patient taking differently: Take 40 mg by mouth daily before breakfast. 03/13/18   Esterwood, Amy S, PA-C  QUEtiapine (SEROQUEL) 25 MG tablet Take 1 tablet (25 mg total) by mouth at bedtime. 05/22/22   Elby Showers, MD  repaglinide (PRANDIN) 0.5 MG tablet TAKE 1 TABLET(0.5 MG) BY MOUTH TWICE DAILY BEFORE A MEAL Patient taking differently: Take 0.5 mg by mouth See admin instructions. Take 0.5 mg by mouth at 5 pm (AFTER dialysis) only on Mon/Wed/Fri 05/29/22   Elayne Snare, MD  rosuvastatin (CRESTOR) 10 MG tablet Take 1 tablet (10 mg total) by mouth daily. 06/11/22   Eugenie Filler, MD  sevelamer carbonate (RENVELA) 800 MG tablet Take 1,600-2,400 mg by mouth See admin instructions. Take 2,400 mg by mouth three times a day with meals and 1,600 mg with snacks    [provider]  Syringe/Needle, Disp, (SYRINGE 3CC/25GX1") 25G X 1" 3 ML MISC 1 each by Does not apply route every 30 (thirty) days. 04/13/22   Elby Showers, MD  TRADJENTA 5 MG TABS tablet Take 1 tablet (5 mg total) by mouth daily. 11/16/21   Elayne Snare, MD    Physical Exam: Vitals:   06/21/22 0800 06/21/22 0815 06/21/22 0830 06/21/22 0845  BP: (!) 133/55 (!) 146/56 (!) 146/53 (!) 144/57  Pulse: 65 64 65 62  Resp: 14 14  15 15   Temp:      TempSrc:      SpO2: 96% 97% 96% 96%   General:  Appears calm and comfortable and is in NAD Eyes:  EOMI, normal lids, iris ENT:  grossly normal hearing, lips & tongue, mmm Neck:  no LAD, masses or thyromegaly Cardiovascular:  RRR, no m/r/g. No LE edema.  Respiratory:   CTA bilaterally with no wheezes/rales/rhonchi.  Normal respiratory effort. Abdomen:  soft, NT, ND Skin:  no rash or induration seen on limited exam Musculoskeletal:  grossly normal tone BUE/BLE, good ROM, no bony abnormality Psychiatric:  mildly confused mood and affect, speech fluent and appropriate Neurologic:  CN 2-12 grossly intact, moves all extremities in coordinated fashion   Radiological Exams on Admission: Independently reviewed - see discussion in A/P where applicable  DG Chest 2 View  Result Date: 06/21/2022 CLINICAL DATA:  Fatigue and cough EXAM: CHEST - 2 VIEW COMPARISON:  Radiographs 06/14/2022 FINDINGS: Stable cardiomediastinal silhouette. Aortic atherosclerotic calcification. Decreased small left pleural effusion and decreased left lower lobe opacity compared to 06/14/2022. The right lung is clear. No pneumothorax. No acute osseous abnormality. IMPRESSION: Decreased small left pleural effusion and lower lobe atelectasis versus infiltrates since 06/14/2022. Electronically Signed   By: Placido Sou M.D.   On: 06/21/2022 00:38    EKG: Independently reviewed.  NSR with 1st degree AV block with rate 69; prolonged QTc 518; nonspecific ST changes with no evidence of acute ischemia   Labs on Admission: I have personally reviewed the available labs and imaging studies at the time of the admission.  Pertinent labs:    Glucose 135 Creatinine 4.84 Albumin 2.8 BNP 1674.6 WBC  6.4 Hgb 8.2 COVID positive   Assessment and Plan: Principal Problem:   COVID-19 virus infection Active Problems:   Mixed diabetic hyperlipidemia associated with type 2 diabetes mellitus (HCC)   Hypothyroidism    Weakness   ESRD on dialysis (HCC)   Mild cognitive impairment with memory loss   Diabetes mellitus type 2 in nonobese (HCC)   Dementia without behavioral disturbance (HCC)   Paroxysmal atrial fibrillation (HCC)   Prolonged QT interval   Systolic and diastolic CHF, chronic (HCC)   Protein-calorie malnutrition, moderate ([place])   DNR (do not resuscitate)    COVID infection with generalized weakness -Patient with presenting with generalized weakness and cough -Chronic abdominal pain without the presence of other GI symptoms -He is not currently requiring [place] O2 -COVID POSITIVE -The patient has comorbidities which may increase the risk for ARDS/MODS including: age, HTN, DM, CKD, CAD -Pertinent labs concerning for COVID include normal WBC; increased BUN/Creatinine; other COVID labs are pending -CXR without apparent changes/worsening   -Will not treat with broad-spectrum antibiotics at this time -Will admit for further evaluation, close monitoring, and treatment -At this time, will attempt to avoid use of aerosolized medications and use HFAs instead -Will order steroids and molnupiravir -PT/OT consults -Encourage mobilization/ambulation as much as possible -Patient was seen wearing full PPE including: gown, gloves, N95; donning and doffing was in compliance with current standards.  ESRD on HD -Patient on chronic MWF HD -Nephrology prn order set utilized -He does not appear to be volume overloaded or otherwise in need of acute HD -Nephrology has been notified that patient will need HD tomorrow -Continue Renvela  Malnutrition -BMI is 24, albumin is 2.8 -The patient has at least 2 indicators for malnutrition (insufficient energy intake, loss of muscle mass, diminished functional status). -This is likely due to starvation related/chronic disease/acute disease.   -Will obtain a nutrition consult for further recommendations.   Dementia -Wife reports that memory loss is worse due to this  acute illness -Hold Aricept for now due to prolonged QT -Continue Namenda, Seroquel -If he remains unable to ambulate, his wife cannot care for him at home; PT/OT/TOC team consults  DM -Recent A1c was 6.5 -hold Prandin, [place] with very sensitive-scale SSI  Hypotension -Continue midodrine on HD days  HLD -Continue rosuvastatin  CAD -Continue ASA  Chronic diastolic CHF -Recent echo with EF 45-50% -Volume control with HD  A-fib  -Currently in NSR -Rate controlled without medication -No AC due to h/o GI bleeds  Prolonged QTc -Will attempt to avoid QT-prolonging medications such as PPI, nausea meds, SSRIs  Anemia -Likely due to CKD, appears to be stable (Hgb was 12 on 9/26 but this appears to have been a lab error based on many others consistently in the 8s) -h/o GI bleeds and small bowel AVMs -No current concern for bleeding -Continue PO PPI (Protonix due to formulary substitution)  Hypothyroidism -Continue Synthroid at current dose for now    Gout -Continue allopurinol  DNR -I have discussed code status with the patient's wife; the patient would not desire resuscitation and would prefer to die a natural death should that situation arise. -He will need a gold out of facility DNR form at the time of discharge     Advance Care Planning:   Code Status: DNR   Consults: Nephrology; PT/OT; [facility] team; nutrition  DVT Prophylaxis: Heparin  Family Communication: None present; I spoke with his wife by telephone at the time of admission  Severity of Illness: The appropriate  patient status for this patient is INPATIENT. Inpatient status is judged to be reasonable and necessary in order to provide the required intensity of service to ensure the patient's safety. The patient's presenting symptoms, physical exam findings, and initial radiographic and laboratory data in the context of their chronic comorbidities is felt to place them at high risk for further clinical  deterioration. Furthermore, it is not anticipated that the patient will be medically stable for discharge from the hospital within 2 midnights of admission.   * I certify that at the point of admission it is my clinical judgment that the patient will require inpatient hospital care spanning beyond 2 midnights from the point of admission due to high intensity of service, high risk for further deterioration and high frequency of surveillance required.*  Author: Karmen Bongo, MD 06/21/2022 11:01 AM  For on call review [email or web address].

## 2022-06-21 NOTE — ED Notes (Signed)
Patient declining to get up and attempt to walk

## 2022-06-21 NOTE — ED Provider Notes (Signed)
[facility] EMERGENCY DEPARTMENT Provider Note   CSN: [id number] Arrival date & time: 06/20/22  2343     History  Chief Complaint  Patient presents with   Fatigue    [name] is a 81 y.o. male.  The history is provided by the patient, the EMS personnel and medical records.  [name] is a 81 y.o. male who presents to the Emergency Department complaining of malaise.  Pt brought in by EMS for feeling unwell for 1-2 days.  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  He did receive his dialysis today.  He lives at home with his wife.  There is report of increased generalized weakness as well as malaise and generalized pain.  Also reports a productive cough.  He has a history of dementia.  Patient complains of low back pain which is chronic for him as well as abdominal discomfort which he describes as mild and an ongoing issue.  Attempted to reach patient's wife over the phone shortly after patient's ED arrival with no answer.       Home Medications Prior to Admission medications   Medication Sig Start Date End Date Taking? Authorizing Provider  acetaminophen (TYLENOL) 325 MG tablet Take 1-2 tablets (325-650 mg total) by mouth every 4 (four) hours as needed for mild pain. Patient taking differently: Take 650 mg by mouth in the morning and at bedtime. 08/01/18   Love, Ivan Anchors, PA-C  albuterol (VENTOLIN HFA) 108 (90 Base) MCG/ACT inhaler Inhale 1-2 puffs into the lungs daily as needed for wheezing or shortness of breath.    [provider]  allopurinol (ZYLOPRIM) 100 MG tablet Take 100 mg by mouth daily.    [provider]  Blood Glucose Monitoring Suppl (FREESTYLE FREEDOM LITE) w/Device KIT Use to check blood sugar 2 times per day dx code E11.65 10/21/18   Elayne Snare, MD  camphor-menthol [facility]) lotion Apply 1 application topically 4 (four) times daily as needed for itching.     [provider]  Carboxymethylcellulose  Sodium (THERATEARS) 0.25 % SOLN Place 1 drop into both eyes 3 (three) times daily as needed (for dryness).    [provider]  cetirizine (ZYRTEC) 10 MG tablet Take 10 mg by mouth daily as needed for allergies.    [provider]  cyanocobalamin (,VITAMIN B-12,) 1000 MCG/ML injection Inject 1 mL (1,000 mcg total) into the muscle every 30 (thirty) days. 04/13/22   Elby Showers, MD  donepezil (ARICEPT) 10 MG tablet Take 1 tablet (10 mg total) by mouth at bedtime. 09/26/21   Melvenia Beam, MD  doxercalciferol (HECTOROL) 4 MCG/2ML injection Inject 2.5 mLs (5 mcg total) into the vein every Monday, Wednesday, and Friday with hemodialysis. 06/18/22   Allie Bossier, MD  fluticasone (FLONASE) 50 MCG/ACT nasal spray Place 1 spray into both nostrils daily. Patient taking differently: Place 2 sprays into both nostrils daily as needed for allergies. 10/08/14   Mikhail, Maryann, DO  FORTEO 600 MCG/2.4ML SOPN INJECT 20 MCG UNDER THE SKIN DAILY (DISCARD 28 DAYS AFTER INITIAL USE) Patient taking differently: Inject 20 mcg into the skin at bedtime. (DISCARD 28 DAYS AFTER INITIAL USE) 05/14/22   Elayne Snare, MD  glucose blood (FREESTYLE LITE) test strip USE AS DIRECTED THREE TIMES DAILY 02/16/22   Elayne Snare, MD  hydrocortisone 2.5 % cream Apply 1 application topically 3 (three) times daily as needed (skin irritation (legs & arms)).  01/13/19   [provider]  Lancets (FREESTYLE) lancets Use as instructed to check blood sugar 2 times per day dx code E11.65 02/22/16   Elayne Snare, MD  levothyroxine (SYNTHROID, LEVOTHROID) 50 MCG tablet Take 1 tablet (50 mcg total) by mouth daily. Patient taking differently: Take 50 mcg by mouth daily before breakfast. 08/06/14   Elby Showers, MD  memantine (NAMENDA) 10 MG tablet Take 1 tablet (10 mg total) by mouth 2 (two) times daily. 09/26/21   Melvenia Beam, MD  midodrine (PROAMATINE) 10 MG tablet Take one pill prior to hemodialysis on MWF Patient taking  differently: Take 10 mg by mouth See admin instructions. Take 10 mg by mouth prior to hemodialysis on Mon/Wed/Fri 08/13/18   Love, Ivan Anchors, PA-C  Nutritional Supplements (FEEDING SUPPLEMENT, NEPRO CARB STEADY,) LIQD Take 237 mLs by mouth in the morning.    [provider]  omeprazole (PRILOSEC) 40 MG capsule Take 1 capsule (40 mg total) by mouth daily. Patient taking differently: Take 40 mg by mouth daily before breakfast. 03/13/18   Esterwood, Amy S, PA-C  QUEtiapine (SEROQUEL) 25 MG tablet Take 1 tablet (25 mg total) by mouth at bedtime. 05/22/22   Elby Showers, MD  repaglinide (PRANDIN) 0.5 MG tablet TAKE 1 TABLET(0.5 MG) BY MOUTH TWICE DAILY BEFORE A MEAL Patient taking differently: Take 0.5 mg by mouth See admin instructions. Take 0.5 mg by mouth at 5 pm (AFTER dialysis) only on Mon/Wed/Fri 05/29/22   Elayne Snare, MD  rosuvastatin (CRESTOR) 10 MG tablet Take 1 tablet (10 mg total) by mouth daily. 06/11/22   Eugenie Filler, MD  sevelamer carbonate (RENVELA) 800 MG tablet Take 1,600-2,400 mg by mouth See admin instructions. Take 2,400 mg by mouth three times a day with meals and 1,600 mg with snacks    [provider]  Syringe/Needle, Disp, (SYRINGE 3CC/25GX1") 25G X 1" 3 ML MISC 1 each by Does not apply route every 30 (thirty) days. 04/13/22   Elby Showers, MD  TRADJENTA 5 MG TABS tablet Take 1 tablet (5 mg total) by mouth daily. 11/16/21   Elayne Snare, MD      Allergies    Ambien [zolpidem tartrate] and Penicillins    Review of Systems   Review of Systems  All other systems reviewed and are negative.   Physical Exam Updated Vital Signs BP (!) 116/49   Pulse 66   Temp 98.2 F (36.8 C)   Resp 17   SpO2 93%  Physical Exam Vitals and nursing note reviewed.  Constitutional:      Appearance: He is well-developed.  HENT:     Head: Normocephalic and atraumatic.  Cardiovascular:     Rate and Rhythm: Normal rate and regular rhythm.     Heart sounds: No murmur  heard. Pulmonary:     Effort: Pulmonary effort is normal. No respiratory distress.     Comments: Occasional cough.  Transmitted upper airway noises throughout lung fields on auscultation.  Abdominal:     Palpations: Abdomen is soft.     Tenderness: There is no abdominal tenderness. There is no guarding or rebound.  Musculoskeletal:        General: No swelling or tenderness.     Comments: Fistula in LUE with dressing in place C/D/I  Skin:    General: Skin is warm and dry.  Neurological:     Mental Status: He is alert.     Comments: Oriented to person and place.  Disoriented to recent events.  5/5 strength  in all four extremities.    Psychiatric:        Behavior: Behavior normal.     ED Results / Procedures / Treatments   Labs (all labs ordered are listed, but only abnormal results are displayed) Labs Reviewed  RESP PANEL BY RT-PCR (FLU A&B, COVID) ARPGX2 - Abnormal; Notable for the following components:      Result Value   SARS Coronavirus 2 by RT PCR POSITIVE (*)    All other components within normal limits  COMPREHENSIVE METABOLIC PANEL - Abnormal; Notable for the following components:   Chloride 97 (*)    Glucose, Bld 135 (*)    Creatinine, Ser 4.84 (*)    Total Protein 5.6 (*)    Albumin 2.8 (*)    GFR, Estimated 11 (*)    All other components within normal limits  CBC WITH DIFFERENTIAL/PLATELET - Abnormal; Notable for the following components:   RBC 2.49 (*)    Hemoglobin 8.2 (*)    HCT 25.7 (*)    MCV 103.2 (*)    RDW 18.6 (*)    Platelets 129 (*)    nRBC 0.8 (*)    All other components within normal limits  BRAIN NATRIURETIC PEPTIDE - Abnormal; Notable for the following components:   B Natriuretic Peptide 1,674.6 (*)    All other components within normal limits  LIPASE, BLOOD  URINALYSIS, ROUTINE W REFLEX MICROSCOPIC  I-STAT CHEM 8, ED    EKG EKG Interpretation  Date/Time:  Thursday June 21 2022 00:28:48 EDT Ventricular Rate:  69 PR  Interval:  270 QRS Duration: 88 QT Interval:  484 QTC Calculation: 518 R Axis:   -16 Text Interpretation: Sinus rhythm with 1st degree A-V block Nonspecific T wave abnormality Abnormal ECG Confirmed by Quintella Reichert [phone]) on 06/21/2022 12:34:37 AM  Radiology DG Chest 2 View  Result Date: 06/21/2022 CLINICAL DATA:  Fatigue and cough EXAM: CHEST - 2 VIEW COMPARISON:  Radiographs 06/14/2022 FINDINGS: Stable cardiomediastinal silhouette. Aortic atherosclerotic calcification. Decreased small left pleural effusion and decreased left lower lobe opacity compared to 06/14/2022. The right lung is clear. No pneumothorax. No acute osseous abnormality. IMPRESSION: Decreased small left pleural effusion and lower lobe atelectasis versus infiltrates since 06/14/2022. Electronically Signed   By: Placido Sou M.D.   On: 06/21/2022 00:38    Procedures Procedures    Medications Ordered in ED Medications - No data to display  ED Course/ Medical Decision Making/ A&P Clinical Course as of 06/21/22 0852  Thu Jun 21, 2022  0720 Assumed care from Dr Ralene Bathe. 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 Has COVID and pt is too weak to go home. Still on RA. Awaiting call from medicine. If medicine rejects will be TOC and PT here.  [RP]    Clinical Course User Index [RP] Fransico Meadow, MD                           Medical Decision Making Amount and/or Complexity of Data Reviewed Labs: ordered. Radiology: ordered.  Risk Decision regarding hospitalization.   Patient with history of dementia, ESRD on hemodialysis, CHF, diabetes, coronary artery disease here for evaluation of malaise, cough.  Patient is not able to provide much meaningful history.  Additional history available from his wife several hours after his initial assessment.  Wife states that he stopped walking 2 days ago and normally ambulates with a walker.  She reports worsening cough and profound weakness.  She  states she is unable to care for him due to his severe weakness.  Labs significant for positive COVID-19 test today.  Otherwise renal function, CBC BNP are near his baseline.  Chest x-ray without acute infiltrate.  He does have a history of chronic abdominal pain, does have mild tenderness without peritoneal findings.  He had a CT abdomen pelvis performed on September 21 without significant abnormality at that time.  Patient is unable to walk in the emergency department today.  He has generalized weakness without focal deficits.  Given his positive COVID-19 diagnosis and inability to self-care medicine consulted for admission for ongoing care.        Final Clinical Impression(s) / ED Diagnoses Final diagnoses:  COVID-19  Weakness    Rx / DC Orders ED Discharge Orders     None         Quintella Reichert, MD 06/21/22 [phone]

## 2022-06-21 NOTE — Progress Notes (Signed)
Pt receives out-pt HD at [facility] on MWF. Will assist as needed.   Steed Kanaan Renal Navigator [phone] 

## 2022-06-21 NOTE — Progress Notes (Signed)
[facility] Progress Note   Background: [name] is an 81 Y/O male in hemodialysis MWF at [facility]. PMH: CAD, HTN, dementia, SHPT, Anemia. Last admission 09/21-09/24/2023 for chest pain, Afib RVR.  He presented to ED 09/27/2023with cough, generalized weakness. T-98.7 BP 139-48 HR 71 RR 14 O2 sats 96% on RA. He is positive for COVID 19. He has been started on Molnupiravir and admitted as in-patient.  Last HD 06/20/2022. He signs off early most treatments,ran 3 hrs 19 minutes of treatment. Left 0.3 kg under EDW 70 kg. Now holding in ER. Labs unremarkable for dialysis patient except HGB 8.2. Had HGB 12.0 prior to discharge 06/17/2022 but doubt accuracy as he'd been trending in 8 range during last admission. 2 V CXR 06/21/2022 with new small L pleural effusion and LL atelectasis.   PCP: Dr. Tommie Ard Baxley Nephrologist: Dr. Joylene Grapes  Subjective: Seen in ED. He is alone, says wife has gone home. Oriented to self and place. Says his stomach hurts and needs something to eat. Denies SOB. No WOB. SR on monitor rate 68. Requesting "something light for lunch". Poor historian, cannot give me details of why and when he was brought to hospital. HPI per EMR/Staff.    Objective Vitals:   06/21/22 0800 06/21/22 0815 06/21/22 0830 06/21/22 0845  BP: (!) 133/55 (!) 146/56 (!) 146/53 (!) 144/57  Pulse: 65 64 65 62  Resp: '14 14 15 15  '$ Temp:      TempSrc:      SpO2: 96% 97% 96% 96%   Physical Exam General: Pleasant, very elderly male in NAD Heart: S1,S2 RRR No M/R/G. SR on monitor Lungs: CTAB A/P. No WOB Abdomen: NABS, NT, ND Extremities: No LE edema.  Dialysis Access: R AVF + T/B   Additional Objective Labs: Basic Metabolic Panel: Recent Labs  Lab 06/15/22 0451 06/16/22 0736 06/17/22 0647 06/17/22 0931 06/21/22 0038  NA 141 141 141 140 141  K 3.9 3.8 3.4* 4.3 3.6  CL 95* 101 112* 98 97*  CO2 32 '31 22 28 31  '$ GLUCOSE 89 86 85 106* 135*  BUN 24* 28*  '13 17 13  '$ CREATININE 6.87* 6.91* 0.95 5.89* 4.84*  CALCIUM 9.0 8.3* 8.2* 9.2 9.1  PHOS 4.7* 3.3 3.1  --   --    Liver Function Tests: Recent Labs  Lab 06/16/22 0736 06/17/22 0647 06/21/22 0038  AST 17 12* 35  ALT '8 8 14  '$ ALKPHOS 110 34* 115  BILITOT 0.4 0.8 0.4  PROT 5.6* 5.4* 5.6*  ALBUMIN 2.8* 3.0* 2.8*   Recent Labs  Lab 06/14/22 1350 06/21/22 0038  LIPASE 39 47   CBC: Recent Labs  Lab 06/14/22 1350 06/15/22 0451 06/16/22 0736 06/17/22 0647 06/21/22 0038  WBC 7.3 4.4 5.3 4.4 6.4  NEUTROABS 4.9  --  2.4 2.0 4.5  HGB 8.8* 8.3* 8.3* 12.0* 8.2*  HCT 28.0* 26.1* 25.6* 35.1* 25.7*  MCV 104.9* 102.4* 101.6* 95.9 103.2*  PLT 213 193 199 155 129*   Blood Culture    Component Value Date/Time   SDES URINE, CLEAN CATCH 04/09/2022 2051   SPECREQUEST  04/09/2022 2051    NONE Performed at [facility], [place] [address], [place], [place] [address]    CULT MULTIPLE SPECIES PRESENT, SUGGEST RECOLLECTION (A) 04/09/2022 2051   REPTSTATUS 04/11/2022 FINAL 04/09/2022 2051    Cardiac Enzymes: No results for input(s): "CKTOTAL", "CKMB", "CKMBINDEX", "TROPONINI" in the last 168 hours. CBG: Recent Labs  Lab 06/15/22  2116 06/16/22 1251 06/16/22 1625 06/16/22 2131 06/17/22 0613  GLUCAP 81 77 100* 144* 98   Iron Studies: No results for input(s): "IRON", "TIBC", "TRANSFERRIN", "FERRITIN" in the last 72 hours. '@lablastinr3'$ @ Studies/Results: DG Chest 2 View  Result Date: 06/21/2022 CLINICAL DATA:  Fatigue and cough EXAM: CHEST - 2 VIEW COMPARISON:  Radiographs 06/14/2022 FINDINGS: Stable cardiomediastinal silhouette. Aortic atherosclerotic calcification. Decreased small left pleural effusion and decreased left lower lobe opacity compared to 06/14/2022. The right lung is clear. No pneumothorax. No acute osseous abnormality. IMPRESSION: Decreased small left pleural effusion and lower lobe atelectasis versus infiltrates since 06/14/2022. Electronically Signed   By: Placido Sou M.D.   On: 06/21/2022 00:38   Medications:   allopurinol  100 mg Oral Daily   feeding supplement (NEPRO CARB STEADY)  237 mL Oral q AM   heparin  5,000 Units Subcutaneous Q8H   insulin aspart  0-5 Units Subcutaneous QHS   insulin aspart  0-6 Units Subcutaneous TID WC   ipratropium  2 puff Inhalation Q6H   [START ON 06/22/2022] levothyroxine  50 mcg Oral QAC breakfast   memantine  10 mg Oral BID   methylPREDNISolone (SOLU-MEDROL) injection  1 mg/kg Intravenous Q12H   Followed by   Derrill Memo ON 06/24/2022] predniSONE  50 mg Oral Daily   [START ON 06/22/2022] midodrine  10 mg Oral Q M,W,F-HD   molnupiravir EUA  4 capsule Oral BID   pantoprazole  40 mg Oral Daily   QUEtiapine  25 mg Oral QHS   rosuvastatin  10 mg Oral Daily   sevelamer carbonate  2,400 mg Oral TID WC   And   sevelamer carbonate  1,600 mg Oral With snacks   Outpatient Dialysis Orders: Center: [facility]  on MWF. 3:75 hrs 180NRe 400/Auto 1.5 EDW 70 kg 2K 2Ca AVF   -No heparin -Mircera 121mg IV q 2 weeks- last dose 1282m on 9/18 -Hectorol 57m42mIV TIW   Assessment/Plan: COVID 19-Started on Molnupiravir, solumedrol per primary.   ESRD:  MWF schedule. Next HD 06/22/2022. No heparin. K+ 3.6. Use 3.0 K bath.   Hypertension/volume: BP controlled, no volume overload on exam or CXR.  EDW recently lowered at OP unit. On midodrine '10mg'$  PO pre HD, will continue here.   Anemia: Hb 8.3 then 12 today, not sure if that is accurate, hx of recurrent GI bleed but no active bleeding reported. No heparin with HD. Continue ESA.   Metabolic bone disease: Corrected calcium elevated on arrival, reduced hectorol dose and calcium is not at goal. Phos at goal, continue renvela  Nutrition:  Renal diet/fluid restriction  H/O AFib-Currently SR. No AC.  H/O CAD H/O Dementia-meds per primary HRrEF-Optimize volume with HD.   Leeasia Secrist H. Marena Witts NP-C 06/21/2022, 11:35 AM  [phone] ,bp

## 2022-06-22 DIAGNOSIS — Z992 Dependence on renal dialysis: Secondary | ICD-10-CM

## 2022-06-22 DIAGNOSIS — R9431 Abnormal electrocardiogram [ECG] [EKG]: Secondary | ICD-10-CM

## 2022-06-22 DIAGNOSIS — I480 Paroxysmal atrial fibrillation: Secondary | ICD-10-CM

## 2022-06-22 DIAGNOSIS — R531 Weakness: Secondary | ICD-10-CM

## 2022-06-22 DIAGNOSIS — Z66 Do not resuscitate: Secondary | ICD-10-CM

## 2022-06-22 DIAGNOSIS — E038 Other specified hypothyroidism: Secondary | ICD-10-CM

## 2022-06-22 DIAGNOSIS — E440 Moderate protein-calorie malnutrition: Secondary | ICD-10-CM

## 2022-06-22 DIAGNOSIS — I5042 Chronic combined systolic (congestive) and diastolic (congestive) heart failure: Secondary | ICD-10-CM

## 2022-06-22 DIAGNOSIS — N186 End stage renal disease: Secondary | ICD-10-CM

## 2022-06-22 DIAGNOSIS — E119 Type 2 diabetes mellitus without complications: Secondary | ICD-10-CM | POA: Diagnosis not present

## 2022-06-22 DIAGNOSIS — E1169 Type 2 diabetes mellitus with other specified complication: Secondary | ICD-10-CM

## 2022-06-22 DIAGNOSIS — F0390 Unspecified dementia without behavioral disturbance: Secondary | ICD-10-CM | POA: Diagnosis not present

## 2022-06-22 DIAGNOSIS — U071 COVID-19: Secondary | ICD-10-CM | POA: Diagnosis not present

## 2022-06-22 DIAGNOSIS — G3184 Mild cognitive impairment, so stated: Secondary | ICD-10-CM

## 2022-06-22 DIAGNOSIS — E782 Mixed hyperlipidemia: Secondary | ICD-10-CM

## 2022-06-22 LAB — RENAL FUNCTION PANEL
Albumin: 2.7 g/dL — ABNORMAL LOW (ref 3.5–5.0)
Anion gap: 17 — ABNORMAL HIGH (ref 5–15)
BUN: 50 mg/dL — ABNORMAL HIGH (ref 8–23)
CO2: 29 mmol/L (ref 22–32)
Calcium: 8.9 mg/dL (ref 8.9–10.3)
Chloride: 94 mmol/L — ABNORMAL LOW (ref 98–111)
Creatinine, Ser: 8.92 mg/dL — ABNORMAL HIGH (ref 0.61–1.24)
GFR, Estimated: 6 mL/min — ABNORMAL LOW (ref >60)
Glucose, Bld: 167 mg/dL — ABNORMAL HIGH (ref 70–99)
Phosphorus: 5.3 mg/dL — ABNORMAL HIGH (ref 2.5–4.6)
Potassium: 4.7 mmol/L (ref 3.5–5.1)
Sodium: 140 mmol/L (ref 135–145)

## 2022-06-22 LAB — HEPATITIS B SURFACE ANTIBODY,QUALITATIVE

## 2022-06-22 LAB — CBC
HCT: 27.1 % — ABNORMAL LOW (ref 39.0–52.0)
Hemoglobin: 8.7 g/dL — ABNORMAL LOW (ref 13.0–17.0)
MCH: 32.5 pg (ref 26.0–34.0)
MCHC: 32.1 g/dL (ref 30.0–36.0)
MCV: 101.1 fL — ABNORMAL HIGH (ref 80.0–100.0)
Platelets: 162 10*3/uL (ref 150–400)
RBC: 2.68 MIL/uL — ABNORMAL LOW (ref 4.22–5.81)
RDW: 18.3 % — ABNORMAL HIGH (ref 11.5–15.5)
WBC: 4.5 10*3/uL (ref 4.0–10.5)
nRBC: 0 % (ref 0.0–0.2)

## 2022-06-22 LAB — HEPATITIS B SURFACE ANTIBODY, QUANTITATIVE: Hep B S AB Quant (Post): 188.3 m[IU]/mL (ref >9.9)

## 2022-06-22 LAB — GLUCOSE, CAPILLARY
Glucose-Capillary: 146 mg/dL — ABNORMAL HIGH (ref 70–99)
Glucose-Capillary: 157 mg/dL — ABNORMAL HIGH (ref 70–99)
Glucose-Capillary: 146 mg/dL — ABNORMAL HIGH (ref 70–99)
Glucose-Capillary: 165 mg/dL — ABNORMAL HIGH (ref 70–99)

## 2022-06-22 LAB — HEPATITIS C ANTIBODY: HCV Ab: NONREACTIVE — AB

## 2022-06-22 LAB — HEPATITIS B SURFACE ANTIGEN

## 2022-06-22 MED ORDER — NEPRO/CARBSTEADY PO LIQD
237.0000 mL | Freq: Three times a day (TID) | ORAL | Status: DC
  Administered 2022-06-22 – 2022-06-24 (×5): 237 mL via ORAL

## 2022-06-22 MED ORDER — METHYLPREDNISOLONE SODIUM SUCC 125 MG IJ SOLR
60.0000 mg | Freq: Every day | INTRAMUSCULAR | Status: DC
  Administered 2022-06-23 – 2022-06-24 (×2): 60 mg via INTRAVENOUS
  Filled 2022-06-22 (×2): qty 2

## 2022-06-22 NOTE — Evaluation (Signed)
Physical Therapy Evaluation Patient Details Name: [name] MRN: [id number] DOB: [birth year] Today's Date: 06/22/2022  History of Present Illness  81 y.o. male presnts to [facility] on 9/27 with weakness, malaise, productive cough. + covid 19. Recent admission starting 9/21 for afib with RVR. PMH - dementia, fall w/ R hip fx s/p IM nailing 10/2020, Hx of UTIs, ESRD on HD MWF, PVD, HTN, afib, GI bleed, gout, DVT, HF, DMII.  Clinical Impression   Pt presents with generalized weakness, impaired balance, and decreased activity tolerance vs baseline. Pt to benefit from acute PT to address deficits. Pt ambulated room distance with use of RW, overall requiring close guard to light physical assist only. PT anticipates pt to improve mobility with continued, daily mobility in acute setting. Recommend HHPT to address deficits post-acutely. PT to progress mobility as tolerated, and will continue to follow acutely.         Recommendations for follow up therapy are one component of a multi-disciplinary discharge planning process, led by the attending physician.  Recommendations may be updated based on patient status, additional functional criteria and insurance authorization.  Follow Up Recommendations Home health PT      Assistance Recommended at Discharge Frequent or constant Supervision/Assistance  Patient can return home with the following  A little help with walking and/or transfers;Direct supervision/assist for financial management;Direct supervision/assist for medications management;A little help with bathing/dressing/bathroom    Equipment Recommendations None recommended by PT  Recommendations for Other Services       Functional Status Assessment Patient has had a recent decline in their functional status and demonstrates the ability to make significant improvements in function in a reasonable and predictable amount of time.     Precautions / Restrictions Precautions Precautions:  Fall Restrictions Weight Bearing Restrictions: No      Mobility  Bed Mobility Overal bed mobility: Needs Assistance Bed Mobility: Supine to Sit, Sit to Supine     Supine to sit: Min assist Sit to supine: Min assist   General bed mobility comments: assist for trunk elevation and lowering, LE positioning once returned to supine    Transfers Overall transfer level: Needs assistance Equipment used: Rollator (4 wheels) Transfers: Sit to/from Stand Sit to Stand: Min assist           General transfer comment: light initial power up assist    Ambulation/Gait Ambulation/Gait assistance: Min guard Gait Distance (Feet): 30 Feet Assistive device: Rolling walker (2 wheels) Gait Pattern/deviations: Step-through pattern, Decreased stride length, Trunk flexed Gait velocity: decr     General Gait Details: cues for proximity to RW, upright posture  Stairs            Wheelchair Mobility    Modified Rankin (Stroke Patients Only)       Balance Overall balance assessment: Needs assistance Sitting-balance support: No upper extremity supported, Feet supported Sitting balance-Leahy Scale: Fair     Standing balance support: Bilateral upper extremity supported, During functional activity, Reliant on assistive device for balance Standing balance-Leahy Scale: Poor Standing balance comment: Rw for gait                             Pertinent Vitals/Pain Pain Assessment Pain Assessment: No/denies pain Pain Intervention(s): Monitored during session    Home Living Family/patient expects to be discharged to:: Private residence Living Arrangements: Spouse/significant other Available Help at Discharge: Family;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 hours/day Type of Home: House Home Access: Stairs to enter Entrance Stairs-Rails: Right;Left;Can  reach both Entrance Stairs-Number of Steps: stair lift in the garage   Home Layout: Two level;Able to live on main level with bedroom/bathroom Home  Equipment: Rolling Walker (2 wheels);Hospital bed;Transport chair;Wheelchair - manual;Grab bars - toilet Additional Comments: Info from recent admission    Prior Function Prior Level of Function : Patient poor historian/Family not available             Mobility Comments: pt reports walks with rollator ADLs Comments: Personal care attendant;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 hours/day (PCA 5 days/wk MWF 8:30-11am and T,TH 9-12am.)     Hand Dominance   Dominant Hand: Right    Extremity/Trunk Assessment   Upper Extremity Assessment Upper Extremity Assessment: Defer to OT evaluation    Lower Extremity Assessment Lower Extremity Assessment: Generalized weakness    Cervical / Trunk Assessment Cervical / Trunk Assessment: Kyphotic  Communication   Communication: HOH  Cognition Arousal/Alertness: Awake/alert Behavior During Therapy: WFL for tasks assessed/performed Overall Cognitive Status: History of cognitive impairments - at baseline Area of Impairment: Orientation, Attention, Following commands, Safety/judgement, Problem solving                 Orientation Level: Disoriented to, Situation Current Attention Level: Sustained   Following Commands: Follows one step commands with increased time Safety/Judgement: Decreased awareness of safety, Decreased awareness of deficits   Problem Solving: Difficulty sequencing, Requires verbal cues, Requires tactile cues General Comments: history of dementia, requires cuing throughout mobility for safe mobilization.        General Comments      Exercises     Assessment/Plan    PT Assessment Patient needs continued PT services  PT Problem List Decreased strength;Decreased activity tolerance;Decreased balance;Decreased mobility;Decreased safety awareness;Decreased knowledge of use of DME;Decreased coordination       PT Treatment Interventions DME instruction;Gait training;Functional mobility training;Balance training;Therapeutic  exercise;Therapeutic activities;Patient/family education    PT Goals (Current goals can be found in the Care Plan section)  Acute Rehab PT Goals Patient Stated Goal: return home PT Goal Formulation: With patient Time For Goal Achievement: 07/06/22 Potential to Achieve Goals: Good    Frequency Min 3X/week     Co-evaluation               AM-PAC PT "6 Clicks" Mobility  Outcome Measure Help needed turning from your back to your side while in a flat bed without using bedrails?: A Little Help needed moving from lying on your back to sitting on the side of a flat bed without using bedrails?: A Little Help needed moving to and from a bed to a chair (including a wheelchair)?: A Little Help needed standing up from a chair using your arms (e.g., wheelchair or bedside chair)?: A Little Help needed to walk in hospital room?: A Little Help needed climbing 3-5 steps with a railing? : A Lot 6 Click Score: 17    End of Session   Activity Tolerance: Patient tolerated treatment well Patient left: in bed;with call bell/phone within reach;with bed alarm set;with nursing/sitter in room Nurse Communication: Mobility status PT Visit Diagnosis: Other abnormalities of gait and mobility (R26.89);Muscle weakness (generalized) (M62.81)    Time: 1040-1102 PT Time Calculation (min) (ACUTE ONLY): 22 min   Charges:   PT Evaluation $PT Eval Low Complexity: 1 Low          Kateryn Marasigan S, PT DPT Acute Rehabilitation Services Pager [phone]  Office [phone]   Toris Laverdiere E Ruffin Pyo 06/22/2022, 12:07 PM

## 2022-06-22 NOTE — Progress Notes (Signed)
[facility] Progress Note    Subjective: Seen in room.  Says he is frustrated with being here still- says he's going to "sneak out".  For HD later today.    Objective Vitals:   06/22/22 0009 06/22/22 0336 06/22/22 0356 06/22/22 0915  BP: 130/70 (!) 151/56  (!) 158/55  Pulse: 78 70  66  Resp: '18 19  18  '$ Temp: 99.2 F (37.3 C) 98.6 F (37 C)  98.4 F (36.9 C)  TempSrc: Oral Oral  Oral  SpO2: 98% 98%  100%  Weight:   68.6 kg    Physical Exam General: Appears a little confused, NAD Heart: S1,S2 RRR  Lungs: CTAB A/P. No WOB Abdomen: NABS, NT, ND Extremities: No LE edema.  Dialysis Access: R AVF + T/B   Additional Objective Labs: Basic Metabolic Panel: Recent Labs  Lab 06/16/22 0736 06/17/22 0647 06/17/22 0931 06/21/22 0038  NA 141 141 140 141  K 3.8 3.4* 4.3 3.6  CL 101 112* 98 97*  CO2 '31 22 28 31  '$ GLUCOSE 86 85 106* 135*  BUN 28* '13 17 13  '$ CREATININE 6.91* 0.95 5.89* 4.84*  CALCIUM 8.3* 8.2* 9.2 9.1  PHOS 3.3 3.1  --   --    Liver Function Tests: Recent Labs  Lab 06/16/22 0736 06/17/22 0647 06/21/22 0038  AST 17 12* 35  ALT '8 8 14  '$ ALKPHOS 110 34* 115  BILITOT 0.4 0.8 0.4  PROT 5.6* 5.4* 5.6*  ALBUMIN 2.8* 3.0* 2.8*   Recent Labs  Lab 06/21/22 0038  LIPASE 47   CBC: Recent Labs  Lab 06/16/22 0736 06/17/22 0647 06/21/22 0038  WBC 5.3 4.4 6.4  NEUTROABS 2.4 2.0 4.5  HGB 8.3* 12.0* 8.2*  HCT 25.6* 35.1* 25.7*  MCV 101.6* 95.9 103.2*  PLT 199 155 129*   Blood Culture    Component Value Date/Time   SDES URINE, CLEAN CATCH 04/09/2022 2051   SPECREQUEST  04/09/2022 2051    NONE Performed at [facility], [place] [address], [place], [place] [address]    CULT MULTIPLE SPECIES PRESENT, SUGGEST RECOLLECTION (A) 04/09/2022 2051   REPTSTATUS 04/11/2022 FINAL 04/09/2022 2051    Cardiac Enzymes: No results for input(s): "CKTOTAL", "CKMB", "CKMBINDEX", "TROPONINI" in the last 168 hours. CBG: Recent Labs  Lab  06/17/22 0613 06/21/22 1151 06/21/22 1612 06/21/22 2140 06/22/22 0858  GLUCAP 98 85 76 96 146*   Iron Studies:  Recent Labs    06/21/22 1120  FERRITIN 388*   '@lablastinr3'$ @ Studies/Results: DG Chest 2 View  Result Date: 06/21/2022 CLINICAL DATA:  Fatigue and cough EXAM: CHEST - 2 VIEW COMPARISON:  Radiographs 06/14/2022 FINDINGS: Stable cardiomediastinal silhouette. Aortic atherosclerotic calcification. Decreased small left pleural effusion and decreased left lower lobe opacity compared to 06/14/2022. The right lung is clear. No pneumothorax. No acute osseous abnormality. IMPRESSION: Decreased small left pleural effusion and lower lobe atelectasis versus infiltrates since 06/14/2022. Electronically Signed   By: Placido Sou M.D.   On: 06/21/2022 00:38   Medications:   allopurinol  100 mg Oral Daily   Chlorhexidine Gluconate Cloth  6 each Topical Q0600   doxercalciferol  9 mcg Intravenous Q M,W,F-HD   feeding supplement (NEPRO CARB STEADY)  237 mL Oral q AM   heparin  5,000 Units Subcutaneous Q8H   insulin aspart  0-5 Units Subcutaneous QHS   insulin aspart  0-6 Units Subcutaneous TID WC   ipratropium  2 puff Inhalation Q6H   levothyroxine  50 mcg Oral  QAC breakfast   memantine  10 mg Oral BID   methylPREDNISolone (SOLU-MEDROL) injection  1 mg/kg Intravenous Q12H   Followed by   Derrill Memo ON 06/24/2022] predniSONE  50 mg Oral Daily   midodrine  10 mg Oral Q M,W,F-HD   molnupiravir EUA  4 capsule Oral BID   pantoprazole  40 mg Oral Daily   QUEtiapine  25 mg Oral QHS   rosuvastatin  10 mg Oral Daily   sevelamer carbonate  2,400 mg Oral TID WC   And   sevelamer carbonate  1,600 mg Oral With snacks   Outpatient Dialysis Orders: Center: [facility]  on MWF. 3:75 hrs 180NRe 400/Auto 1.5 EDW 70 kg 2K 2Ca AVF   -No heparin -Mircera 1744mg IV q 2 weeks- last dose 12544m on 9/18 -Hectorol 44m20mIV TIW   Assessment/Plan: COVID 19-Started on Molnupiravir,  solumedrol per primary.   ESRD:  MWF schedule. Next HD 06/22/2022. No heparin. K+ 3.6. Use 3.0 K bath.   Hypertension/volume: BP controlled, no volume overload on exam or CXR.  EDW recently lowered at OP unit. On midodrine '10mg'$  PO pre HD, will continue here.   Anemia: Hb 8.3.  History of recurrent GI bleed.  No heparin with HD. Continue ESA.   Metabolic bone disease: Corrected calcium elevated on arrival, reduced hectorol dose and calcium is not at goal. Phos at goal, continue renvela  Nutrition:  Renal diet/fluid restriction  H/O AFib-Currently SR. No AC.  H/O CAD H/O Dementia-meds per primary HRrEF-Optimize volume with HD.   EliMadelon Lips 06/22/2022, 11:31 AM  [facility] [phone] ,bp

## 2022-06-22 NOTE — TOC Initial Note (Signed)
Transition of Care [facility]) - Initial/Assessment Note    Patient Details  Name: [name] MRN: [id number] Date of Birth: 20-Dec-1940  [facility] CM/SW Contact:    Curlene Labrum, RN Phone Number: 06/22/2022, 1:29 PM  Clinical Narrative:                 CM called and spoke with the patient's wife by phone for history and discuss regarding transitions of care needs.  The patient was admitted for COVID infection and was living at home with the wife prior to admission.  [facility] in [place], [place] is aware of his admission.  I called and spoke with April, CM with [place] - PCP - Dr. Eulis Manly, [employer], MSW at [place], MSW - [phone].  The patient receives HD on MWF at 1145 and is transported to the facility by [employer].  Personal Care aide services at the home through [facility] and coordinated by [place] in [place], [place].  The patient lives at home with wife - who tested COVID positive today as well.  The patient's wife states that she is hopeful that patient can return home with home health as long as he continues to be mobile.  PT evaluated and HH is recommended - I called Tommi Rumps, CM with [facility] and patient is active for PT/OT - Orders for [facility] placed for continuation of services.  DME at home - rollator, grab bars, WC  CM will continue to follow the patient for discharge planning needs to home - pending discharge date not determined at this time.  Expected Discharge Plan: [place] Barriers to Discharge: Continued Medical Work up   Patient Goals and CMS Choice Patient states their goals for this hospitalization and ongoing recovery are:: to return home CMS Medicare.gov Compare Post Acute Care list provided to:: Patient Represenative (must comment) (Patient's wife) Choice offered to / list presented to : Spouse  Expected Discharge Plan and Services Expected Discharge Plan: [place]   Discharge  Planning Services: CM Consult Post Acute Care Choice: [place] arrangements for the past 2 months: [place] Arranged: PT, OT HH Agency: [place] Date [place]: 06/22/22 Time [place]: [id number] Representative spoke with at [place]: Tommi Rumps, [place] with [place]  Prior Living Arrangements/Services Living arrangements for the past 2 months: Single Family Home Lives with:: Spouse Patient language and need for interpreter reviewed:: Yes Do you feel safe going back to the place where you live?: Yes      Need for Family Participation in Patient Care: Yes (Comment) Care giver support system in place?: Yes (comment)   Criminal Activity/Legal Involvement Pertinent to Current Situation/Hospitalization: No - Comment as needed  Activities of Daily Living Home Assistive Devices/Equipment: Walker (specify type) ADL Screening (condition at time of admission) Patient's cognitive ability adequate to safely complete daily activities?: Yes Is the patient deaf or have difficulty hearing?: No Does the patient have difficulty seeing, even when wearing glasses/contacts?: No Does the patient have difficulty concentrating, remembering, or making decisions?: Yes Patient able to express need for assistance with ADLs?: Yes Independently performs ADLs?: No Communication: Independent Dressing (OT): Needs assistance Is this a change from baseline?: Pre-admission baseline Grooming: Independent Feeding: Independent Bathing: Needs assistance Is  this a change from baseline?: Pre-admission baseline Toileting: Needs assistance Is this a change from baseline?: Pre-admission baseline In/Out Bed: Independent with device (comment) Walks in Home: Independent with device (comment) Does the patient have difficulty walking or climbing stairs?: No Weakness of Legs: None Weakness of Arms/Hands: None  Permission Sought/Granted Permission  sought to share information with : Case Manager Permission granted to share information with : Yes, Verbal Permission Granted     Permission granted to share info w AGENCY: [place], [place] granted to share info w Relationship: [name] - wife - [phone]     Emotional Assessment Appearance:: Other (Comment Required (Patient has COVID - respiratory isolation / Dementia Dx - called wife for history)     Orientation: : Oriented to Self Alcohol / Substance Use: Not Applicable Psych Involvement: No (comment)  Admission diagnosis:  Weakness [R53.1] COVID-19 virus infection [U07.1] COVID-19 [U07.1] Patient Active Problem List   Diagnosis Date Noted   COVID-19 virus infection 06/21/2022   Protein-calorie malnutrition, moderate ([place]) 06/21/2022   DNR (do not resuscitate) 07/21/2535   Systolic and diastolic CHF, chronic ([place]) 06/16/2022   Atypical chest pain 06/16/2022   Anemia of chronic disease 06/16/2022   End-stage renal disease on hemodialysis ([place]) 06/16/2022   Abdominal pain, chronic, left lower quadrant 06/16/2022   Controlled type 2 diabetes mellitus with hyperglycemia ([place]) 06/16/2022   Prolonged QT interval    Acute systolic heart failure ([place])    Chest pain 06/06/2022   Abdominal pain 06/06/2022   Volume overload 06/06/2022   Skin ulcer, limited to breakdown of skin ([place]) 64/40/3474   Chronic systolic congestive heart failure ([place]) 04/12/2022   Depression, major, single episode, moderate ([place]) 04/12/2022   Paroxysmal atrial fibrillation ([place]) 04/12/2022   UTI (urinary tract infection) 11/05/2021   Pressure injury of skin 11/05/2021   Acute blood loss anemia    Acute GI bleeding 11/04/2021   Pulmonary nodule 10/05/2021   Acute encephalopathy 10/02/2021   Hypernatremia 10/02/2021   Elevated troponin 07/04/2021   Mixed diabetic hyperlipidemia associated with type 2 diabetes mellitus ([place]) 07/04/2021   Alzheimer's disease, unspecified (CODE)  ([place]) 01/24/2021   Senile nuclear sclerosis 11/22/2020   Other specified erythematous condition 11/22/2020   Other specified disease of sebaceous glands 11/22/2020   Osteoporosis 11/22/2020   Obesity 11/22/2020   Macrocytosis 11/22/2020   Dystrophia unguium 11/22/2020   Tinea pedis 11/22/2020   Pre-operative clearance    Closed nondisplaced intertrochanteric fracture of right femur ([place]) 11/07/2020   Hip fracture ([place]) 11/07/2020   Fall    Rectal bleeding 04/22/2020   AVM (arteriovenous malformation) of small bowel, acquired    Hiatal hernia    Schatzki's ring of distal esophagus    Other bacterial infections of unspecified site 08/31/2019   Hypocalcemia 04/06/2019   Melena 11/19/2018   Leukocytosis    Anemia    TBI (traumatic brain injury) ([place]) 07/29/2018   Diabetes mellitus type 2 in nonobese (HCC)    Loose stools    Dementia without behavioral disturbance (HCC)    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[place]) 07/21/2018   Multiple fractures of ribs, left side, initial encounter for closed fracture 07/21/2018   Subdural hematoma ([place]) 07/21/2018   Gastritis, unspecified, with bleeding 06/04/2018   Angiodysplasia of stomach    Occult GI bleeding    GIB (gastrointestinal bleeding) 06/01/2018   Low back pain 01/17/2018   Anorexia    Diverticulitis of intestine, part unspecified, without perforation or  abscess without bleeding 08/07/2017   Dyspnea    Macrocytic anemia 06/04/2016   Thrombocytopenia (HCC) 06/04/2016   Exertional dyspnea 06/04/2016   Arm paresthesia, left 06/04/2016   Dyspnea on exertion 06/04/2016   Tenderness of right calf 06/04/2016   Mild cognitive impairment with memory loss 01/12/2016   ESRD on dialysis ([place]) 05/17/2015   Hyperlipidemia, unspecified 02/25/2015   Pruritus, unspecified 02/25/2015   Pain, unspecified 02/25/2015   Other specified coagulation defects ([place]) 02/25/2015   Iron deficiency anemia, unspecified 02/25/2015   Headache,  unspecified 02/25/2015   Gout, unspecified 02/25/2015   Fever, unspecified 02/25/2015   Diarrhea, unspecified 02/25/2015   Acute on chronic anemia 02/25/2015   Acute embolism and thrombosis of left subclavian vein ([place]) 02/25/2015   Hypoglycemia    Weakness 02/18/2015   OSA (obstructive sleep apnea) 02/02/2015   Hyperkalemia 10/05/2014   ESRD (end stage renal disease) ([place]) 11/18/2013   BPH (benign prostatic hyperplasia) 11/22/2012   Peripheral vascular disease ([place]) 12/24/2011   Hyperparathyroidism ([place]) 12/24/2011   Hypothyroidism 12/24/2011   Allergic rhinitis 12/24/2011   Erectile dysfunction 12/24/2011   Insulin dependent diabetes mellitus 09/03/2008   CAD in native artery 09/03/2008   Essential hypertension 08/30/2008   PANCREATITIS, HX OF 08/30/2008   RENAL FAILURE, ACUTE, HX OF 08/30/2008   DIVERTICULOSIS, COLON 06/08/2003   PCP:  Elby Showers, MD Pharmacy:   [facility] [place], [place] [address] Phone: [phone] Fax: [place], [place] - [place] [address] [place] [address] Phone: [phone] Fax: [phone]  [place], [place] [address] [place] [address] Phone: [phone] Fax: [phone]     Social Determinants of Health ([place]) Interventions    Readmission Risk Interventions    06/22/2022    1:15 PM 06/15/2022    3:42 PM 06/10/2022    2:43 PM  Readmission Risk Prevention Plan  Transportation Screening Complete Complete Complete  Medication Review ([place]) Complete Complete Complete  PCP or Specialist appointment within 3-5 days of discharge Complete Complete Complete  HRI or Home Care Consult Complete Complete Complete  SW Recovery Care/Counseling Consult Complete Complete  Complete  Palliative Care Screening Not Applicable Not Applicable Not [place] Not Applicable Not Applicable Not Applicable

## 2022-06-22 NOTE — Evaluation (Signed)
Occupational Therapy Evaluation Patient Details Name: [name] MRN: [id number] DOB: 1940-12-29 Today's Date: 06/22/2022   History of Present Illness 81 y.o. male presnts to [facility] on 9/27 with weakness, malaise, productive cough. + covid 19. Recent admission starting 9/21 for afib with RVR. PMH - dementia, fall w/ R hip fx s/p IM nailing 10/2020, Hx of UTIs, ESRD on HD MWF, PVD, HTN, afib, GI bleed, gout, DVT, HF, DMII.   Clinical Impression   Prior to this admission, patient walking with rollator, and requiring assist for lower body ADLs. Patient with known history of dementia, and was poor historian for evaluation. Per chart review patient lives with his wife and also has a PCA that provides assist. Currently, patient with decreased participation and increased confusion/agitation in session to date. Patient min A for bed mobility and mod A for ADLs. Transfers and ambulation deferred due to need for max encouragement and distraction to participate in OT evaluation (patient kept threatening to sue OT but could be redirected). OT recommending [place] services at this is the patient's 2nd admission in less than 2 weeks and would benefit from from therapy to increase activity tolerance to promote prior level of function. OT will continue to follow.      Recommendations for follow up therapy are one component of a multi-disciplinary discharge planning process, led by the attending physician.  Recommendations may be updated based on patient status, additional functional criteria and insurance authorization.   Follow Up Recommendations  Home health OT    Assistance Recommended at Discharge Frequent or constant Supervision/Assistance  Patient can return home with the following A lot of help with walking and/or transfers;A lot of help with bathing/dressing/bathroom;Assistance with cooking/housework;Direct supervision/assist for medications management;Help with stairs or ramp for entrance;Assist for  transportation;Direct supervision/assist for financial management    Functional Status Assessment  Patient has had a recent decline in their functional status and demonstrates the ability to make significant improvements in function in a reasonable and predictable amount of time.  Equipment Recommendations  None recommended by OT    Recommendations for Other Services       Precautions / Restrictions Precautions Precautions: Fall Restrictions Weight Bearing Restrictions: No      Mobility Bed Mobility Overal bed mobility: Needs Assistance Bed Mobility: Supine to Sit, Sit to Supine     Supine to sit: Min assist Sit to supine: Min assist   General bed mobility comments: min A to assit with trunk to come into sitting fully upright, min A to guide BLE back into bed    Transfers                   General transfer comment: patient with max encouragement to sit EOB, will continue to assess transfers      Balance Overall balance assessment: Needs assistance Sitting-balance support: No upper extremity supported, Feet supported Sitting balance-Leahy Scale: Fair Sitting balance - Comments: posterior lean throughout Postural control: Posterior lean                                 ADL either performed or assessed with clinical judgement   ADL Overall ADL's : Needs assistance/impaired Eating/Feeding: Set up;Sitting   Grooming: Minimal assistance;Sitting   Upper Body Bathing: Minimal assistance;Sitting   Lower Body Bathing: Moderate assistance;Sitting/lateral leans;Sit to/from stand   Upper Body Dressing : Minimal assistance;Sitting   Lower Body Dressing: Moderate assistance;Sit to/from stand;Sitting/lateral leans;Maximal assistance  Toilet Transfer Details (indicate cue type and reason): patient with max encouragement to sit EOB, will continue to assess transfers         Functional mobility during ADLs: Moderate assistance;Cueing for  sequencing;Cueing for safety General ADL Comments: Patient with decreased activity tolerance, confusion, malaise, and minimal agitation with OT evaluation to date     Vision Baseline Vision/History: 0 No visual deficits Ability to See in Adequate Light: 0 Adequate Patient Visual Report: No change from baseline Vision Assessment?: No apparent visual deficits     Perception     Praxis      Pertinent Vitals/Pain Pain Assessment Pain Assessment: Faces Faces Pain Scale: Hurts a little bit Pain Location: generalized with movement Pain Descriptors / Indicators: Grimacing, Guarding, Discomfort Pain Intervention(s): Limited activity within patient's tolerance, Monitored during session, Repositioned     Hand Dominance Right   Extremity/Trunk Assessment Upper Extremity Assessment Upper Extremity Assessment: Generalized weakness   Lower Extremity Assessment Lower Extremity Assessment: Defer to PT evaluation   Cervical / Trunk Assessment Cervical / Trunk Assessment: Kyphotic   Communication Communication Communication: HOH   Cognition Arousal/Alertness: Awake/alert Behavior During Therapy: Agitated, Restless Overall Cognitive Status: History of cognitive impairments - at baseline Area of Impairment: Orientation, Attention, Memory, Following commands, Safety/judgement                 Orientation Level: Disoriented to, Place, Situation, Time Current Attention Level: Sustained Memory: Decreased short-term memory Following Commands: Follows one step commands inconsistently, Follows one step commands with increased time Safety/Judgement: Decreased awareness of safety, Decreased awareness of deficits     General Comments: Pt with history of dementia. Oriented to person only     General Comments       Exercises     Shoulder Instructions      Home Living Family/patient expects to be discharged to:: Private residence Living Arrangements: Spouse/significant  other Available Help at Discharge: Family;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 hours/day Type of Home: House Home Access: Stairs to enter [employer] of Steps: stair lift in the garage Entrance Stairs-Rails: Right;Left;Can reach both Home Layout: Two level;Able to live on main level with bedroom/bathroom     Bathroom Shower/Tub: Tub/shower unit;Sponge bathes at baseline   Bathroom Toilet: Handicapped height Bathroom Accessibility: Yes How Accessible: Accessible via walker Home Equipment: [place] (2 wheels);Hospital bed;Transport chair;Wheelchair - manual;Grab bars - toilet   Additional Comments: Info from recent admission      Prior Functioning/Environment Prior Level of Function : Patient poor historian/Family not available             Mobility Comments: pt reports pt walks with rollator ADLs Comments: Personal care attendant;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 hours/day (PCA 5 days/wk MWF 8:30-11am and T,TH 9-12am.)        OT Problem List: Impaired balance (sitting and/or standing);Decreased safety awareness;Decreased cognition;Decreased activity tolerance;Decreased strength;Decreased knowledge of use of DME or AE;Decreased knowledge of precautions      OT Treatment/Interventions: Self-care/ADL training;Therapeutic exercise;Energy conservation;DME and/or AE instruction;Manual therapy;Therapeutic activities;Cognitive remediation/compensation;Patient/family education;Balance training    OT Goals(Current goals can be found in the care plan section) Acute Rehab OT Goals Patient Stated Goal: unable to state OT Goal Formulation: Patient unable to participate in goal setting Time For Goal Achievement: 07/06/22 Potential to Achieve Goals: Fair ADL Goals Pt Will Perform Lower Body Bathing: with supervision;sit to/from stand;sitting/lateral leans Pt Will Perform Lower Body Dressing: with supervision;sitting/lateral leans;sit to/from stand Pt Will Transfer to Toilet: with supervision;ambulating Pt Will  Perform Toileting - Clothing Manipulation and hygiene:  with supervision Additional ADL Goal #1: Patient will be able to follow 2 step commands consistently in order to promote increased participation in ADL management. Additional ADL Goal #2: Patient will demonstrate increased activity tolerance in order to complete functional task in standing for 2 minutes prior to needing seated rest break.  OT Frequency: Min 2X/week    Co-evaluation              AM-PAC OT "6 Clicks" Daily Activity     Outcome Measure Help from another person eating meals?: A Little Help from another person taking care of personal grooming?: A Little Help from another person toileting, which includes using toliet, bedpan, or urinal?: A Lot Help from another person bathing (including washing, rinsing, drying)?: A Little Help from another person to put on and taking off regular upper body clothing?: A Little Help from another person to put on and taking off regular lower body clothing?: A Lot 6 Click Score: 16   End of Session Nurse Communication: Mobility status  Activity Tolerance: Treatment limited secondary to agitation;Patient limited by fatigue;Patient limited by lethargy Patient left: in bed;with call bell/phone within reach;with bed alarm set  OT Visit Diagnosis: Unsteadiness on feet (R26.81);Other symptoms and signs involving cognitive function;Muscle weakness (generalized) (M62.81)                Time: 6256-3893 OT Time Calculation (min): 12 min Charges:  OT General Charges $OT Visit: 1 Visit OT Evaluation $OT Eval Moderate Complexity: 1 Mod  Corinne Ports E. Beverly Suriano, OTR/L Acute Rehabilitation Services [phone]   Ascencion Dike 06/22/2022, 9:56 AM

## 2022-06-22 NOTE — Progress Notes (Addendum)
[facility] PROGRESS NOTE  [name]  CVE:[id number] DOB: 15-Jun-1941 DOA: 06/20/2022 PCP: Elby Showers, MD  Brief Narrative: Johnny SHINAULT is an 81 y.o. male with a history of dementia, CAD, T2DM, ESRD (MWF HD), HTN, HLD, recent admission for AFib with RVR, and hypothyroidism who presented to the ED 9/27 from home due to generalized weakness limiting ability to ambulate and cough. His wife is also ill, and he tested positive for covid-19.    Subjective: Hasn't eaten, doesn't feel hungry this AM. Wants to go home. Hasn't gotten OOB this AM. Coughs several times during evaluation.  Objective: BP (!) 158/55 (BP Location: Left Arm)   Pulse 66   Temp 98.4 F (36.9 C) (Oral)   Resp 18   Wt 68.6 kg   SpO2 100%   BMI 23.69 kg/m   Gen: Elderly, frail male in no distress Pulm: Clear and nonlabored, 18/min CV: RRR, no murmur, no JVD, trace edema GI: Soft, NT, ND, +BS  Neuro: Alert and disoriented, diffusely weak, difficult to sit up in bed. No focal deficits. Ext: Warm, no deformities Skin: No rashes, lesions or ulcers  Assessment & Plan: Principal Problem:   COVID-19 virus infection Active Problems:   Mixed diabetic hyperlipidemia associated with type 2 diabetes mellitus (HCC)   Hypothyroidism   Weakness   ESRD on dialysis (HCC)   Mild cognitive impairment with memory loss   Diabetes mellitus type 2 in nonobese (HCC)   Dementia without behavioral disturbance (HCC)   Paroxysmal atrial fibrillation (HCC)   Prolonged QT interval   Systolic and diastolic CHF, chronic (HCC)   Protein-calorie malnutrition, moderate ([place])   DNR (do not resuscitate)  Covid-19 infection: In vaccinated patient, though remotely vaccinated. He currently appears ill, though not hypoxemic, frail, elderly, demented, with Dx malnutrition, ESRD, and his sole caregiver is also sick. He's at very high risk of readmission (was just discharged 9/24)/poor clinical outcome/worsening.  - Continue  molnupiravir and methylprednisolone.  - Note elevated PCT, though no lobar consolidation or leukocytosis, in fact CXR demonstrated decreased size of LLL atelectasis vs. infiltrate and pleural effusion ?utility of PCT in hemodialysis patient, though will request sputum culture and have low threshold for addition of antibiotics.  - Remain on isolation x10 days. - Check LE U/S for d-dimer elevation, though CRP also elevated and could be explained as acute phase reactant alone. Without hypoxia, pleuritic chest pain, low suspicion for PE.   ESRD:  - Appreciate nephrology following, planning routine HD 9/29 - Continue home medication  Moderate protein calorie malnutrition:  - Supplement protein as able  Dementia: Worsened cognitive impairment per his wife with this acute illness.  - Continue namenda, seroquel.  NIDT2DM:  - Hold home medication, monitor for steroid-induced hyperglycemia, though this is chronically well-controlled with HbA1c 6.5%.  - Very sensitive SSI (poor po intake, renal failure)  Hypotension:  - Continue pre-HD midodrine.   HLD:  - Continue rosuvastatin  CAD: No angina.  - Continue ASA.  PAF: Currently NSR without rate/rhythm controlling agents - Not on anticoagulation with hx small bowel AVMs/GI bleeding  Chronic combined HFrEF: Recent echo with borderline LVEF 45-50%.  - Defer to outpatient setting whether need to initiate GDMT, but will manage volume with HD.   Hypothyroidism: TSH 1.8 - continue synthroid  Gout: Quiescent. - Continue allopurinol.  Thrombocytopenia: Suspected to be due to covid - Recheck at follow up.  Anemia of CKD: Stable from prior suspected baseline. Hgb of 12 suspected to be  spurious. No active bleeding noted.   Johnny Pour, MD [facility] [email or web address] 06/22/2022, 4:53 PM

## 2022-06-22 NOTE — Progress Notes (Addendum)
Initial Nutrition Assessment  DOCUMENTATION CODES:   Non-severe (moderate) malnutrition in context of acute illness/injury  INTERVENTION:  -Provide meal preferences to optimize po intake -If po intake poor during admission, consider liberalizing diet -Provide Nepro TID until po intake improves (butter pecan or berry only)-420kcal, 19g protein  NUTRITION DIAGNOSIS:  Moderate Malnutrition related to acute illness as evidenced by energy intake < or equal to 50% for > or equal to 5 days, mild fat depletion, mild muscle depletion.  GOAL:  Patient will meet greater than or equal to 90% of their needs  MONITOR:  PO intake, Supplement acceptance  REASON FOR ASSESSMENT:  Consult Malnutrition Eval  ASSESSMENT:  Pt is an 81yo M with PMH of dementia, CAD, DM, ESRD on MWF HD, HTN, HLD, diverticulitis, GERD and hypothyroidism who presents with weakness, abdominal pain, and lethargy found to be covid positive.  Visited pt at bedside this morning and he was asking for food. When asked what he likes to eat he mentioned pork and ham. Reports very poor appetite PTA. Shows physical signs of malnutrition with mild fat loss and mild muscle wasting. Spoke with pt's wife via phone who reports it was difficlut to get pt to eat anything within the last 2 weeks. Pt consuming <50% EEN for the last 2 weeks. Wife reports pt's weight was last checked 2 weeks ago and it was 70.45kg (155#). Current weight 68.6kg (150.9#). Pt meets ASPEN criteria for moderate protein calorie malnutrition. Wife notes she tries to provide a renal diet at home, but when pt won't eat she liberalizes the diet. She will also give him Nepro (strawberry or butter pecan only). Pt does not like chicken, but will accept pork and beef. Note K is WNL. If po intake poor during admission, consider liberalizing diet.  Medications reviewed and include: nepro, novolog, synthroid, prednisone, protonix, crestor, renvela.  Labs reviewed: BG:76-146, Na and  K WNL, GFR:11, BNP:1674.6, albumin:2.8, CRP:4.7, Ferritin:388  NUTRITION - FOCUSED PHYSICAL EXAM:  Flowsheet Row Most Recent Value  Orbital Region Mild depletion  Upper Arm Region Mild depletion  Thoracic and Lumbar Region Mild depletion  Buccal Region Mild depletion  Temple Region Moderate depletion  Clavicle Bone Region Mild depletion  Clavicle and Acromion Bone Region Mild depletion  Scapular Bone Region Unable to assess  Dorsal Hand Moderate depletion  Patellar Region Mild depletion  Anterior Thigh Region Mild depletion  Posterior Calf Region Mild depletion  Hair Reviewed  Eyes Reviewed  Mouth Reviewed  Skin Reviewed  Nails Reviewed       Diet Order:   Diet Order             Diet renal/carb modified with fluid restriction Fluid restriction: 1200 mL Fluid; Room service appropriate? Yes; Fluid consistency: Thin  Diet effective now                   EDUCATION NEEDS:  Education needs have been addressed  Skin:  Skin Assessment: Reviewed RN Assessment  Last BM:  9/29  Height:  Ht Readings from Last 1 Encounters:  06/14/22 '5\' 7"'$  (1.702 m)    Weight:  Wt Readings from Last 1 Encounters:  06/22/22 68.6 kg   BMI:  Body mass index is 23.69 kg/m.  Estimated Nutritional Needs:   Kcal:  1715-2100kcal  Protein:  85-105g  Fluid:  1033m + 24hr UOP  KCandise Bowens MS, RD, LDN, CNSC See AMiON for contact information

## 2022-06-22 NOTE — TOC Progression Note (Signed)
Transition of Care [facility]) - Progression Note    Patient Details  Name: [name] MRN: [id number] Date of Birth: January 04, 1941  [facility] CM/SW [place], RN Phone Number: 06/22/2022, 11:03 AM  Clinical Narrative:    Representative with [facility] notification line called to deliver authorization # [id number] for patient's inpatient stay at [facility].  I called April, CM at [facility] and left message to obtain MSW contact information for follow up if needed.        Expected Discharge Plan and Services                                                 Social Determinants of Health (SDOH) Interventions    Readmission Risk Interventions    06/15/2022    3:42 PM 06/10/2022    2:43 PM 11/06/2021   12:09 PM  Readmission Risk Prevention Plan  Transportation Screening Complete Complete Complete  PCP or Specialist Appt within 3-5 Days   Complete  HRI or [place]   Complete  Social Work Consult for [place] Planning/Counseling   Complete  Palliative Care Screening   Not Applicable  Medication Review Press photographer) Complete Complete Complete  PCP or Specialist appointment within 3-5 days of discharge Complete Complete   HRI or Home Care Consult Complete Complete   SW Recovery Care/Counseling Consult Complete Complete   Palliative Care Screening Not Applicable Not [place] Not Applicable Not Applicable

## 2022-06-23 ENCOUNTER — Inpatient Hospital Stay (HOSPITAL_COMMUNITY): Payer: Medicare Other

## 2022-06-23 DIAGNOSIS — E119 Type 2 diabetes mellitus without complications: Secondary | ICD-10-CM | POA: Diagnosis not present

## 2022-06-23 DIAGNOSIS — Z66 Do not resuscitate: Secondary | ICD-10-CM | POA: Diagnosis not present

## 2022-06-23 DIAGNOSIS — R609 Edema, unspecified: Secondary | ICD-10-CM

## 2022-06-23 DIAGNOSIS — U071 COVID-19: Secondary | ICD-10-CM | POA: Diagnosis not present

## 2022-06-23 DIAGNOSIS — R7989 Other specified abnormal findings of blood chemistry: Secondary | ICD-10-CM | POA: Diagnosis not present

## 2022-06-23 DIAGNOSIS — F0390 Unspecified dementia without behavioral disturbance: Secondary | ICD-10-CM | POA: Diagnosis not present

## 2022-06-23 LAB — GLUCOSE, CAPILLARY
Glucose-Capillary: 68 mg/dL — ABNORMAL LOW (ref 70–99)
Glucose-Capillary: 214 mg/dL — ABNORMAL HIGH (ref 70–99)
Glucose-Capillary: 58 mg/dL — ABNORMAL LOW (ref 70–99)
Glucose-Capillary: 102 mg/dL — ABNORMAL HIGH (ref 70–99)

## 2022-06-23 MED ORDER — MOLNUPIRAVIR EUA 200MG CAPSULE: 4.0000 | ORAL_CAPSULE | Freq: Two times a day (BID) | ORAL | 0 refills | Status: AC

## 2022-06-23 NOTE — Discharge Summary (Signed)
Physician Discharge Summary   Patient: [name] MRN: [id number] DOB: 04-03-1941  Admit date:     06/20/2022  Discharge date: 06/23/22  Discharge Physician: Patrecia Pour   PCP: Elby Showers, MD   Recommendations at discharge:  Remain on covid isolation x10 days, then follow up with PCP for post discharge follow up appointment. Continue routine HD.  Discharge Diagnoses: Principal Problem:   COVID-19 virus infection Active Problems:   Mixed diabetic hyperlipidemia associated with type 2 diabetes mellitus (HCC)   Hypothyroidism   Weakness   ESRD on dialysis ([place])   Mild cognitive impairment with memory loss   Diabetes mellitus type 2 in nonobese (HCC)   Dementia without behavioral disturbance (HCC)   Paroxysmal atrial fibrillation (HCC)   Prolonged QT interval   Systolic and diastolic CHF, chronic (HCC)   Protein-calorie malnutrition, moderate ([place])   DNR (do not resuscitate)  Hospital Course: JAHZIR STROHMEIER is an 81 y.o. male with a history of dementia, CAD, T2DM, ESRD (MWF HD), HTN, HLD, recent admission for AFib with RVR, and hypothyroidism who presented to the ED 9/27 from home due to generalized weakness limiting ability to ambulate and cough. His wife is also ill, and he tested positive for covid-19. He was given molnupiravir and steroids, though did not have evidence of pneumonia or hypoxia, and has improved functionally. PT/OT evaluations state he is safe to discharge home.  Assessment and Plan: Covid-19 infection: In vaccinated patient, though remotely vaccinated. He currently appears ill, though not hypoxemic, frail, elderly, demented, with Dx malnutrition, ESRD, and his sole caregiver is also sick. He's at very high risk of readmission (was just discharged 9/24)/poor clinical outcome/worsening.  - Continue molnupiravir x5 days, since he's remained without hypoxemia, will defer further steroids after discharge.  - Sputum culture sent and not resulted at time of  discharge.  - Remain on isolation x10 days. - Check LE U/S for d-dimer elevation, though CRP also elevated and could be explained as acute phase reactant alone. Without hypoxia, pleuritic chest pain, low suspicion for PE. U/S suggests superficial thrombosis of right small saphenous vein. Tx with warm compresses prn, though this appears asymptomatic.   ESRD:  - Appreciate nephrology following, planning routine HD 9/29 - Continue home medication   Moderate protein calorie malnutrition:  - Supplement protein as able   Dementia: Worsened cognitive impairment per his wife with this acute illness.  - Continue namenda, seroquel.   NIDT2DM:  - Ok to continue home regimen after discharge. DC'ed steroids at discharge to avoid steroid-induced hyperglycemia. This is chronically well-controlled with HbA1c 6.5%.     Hypotension:  - Continue pre-HD midodrine.    HLD:  - Continue rosuvastatin   CAD: No angina.  - Continue ASA.   PAF: Currently NSR without rate/rhythm controlling agents - Not on anticoagulation with hx small bowel AVMs/GI bleeding (no bleeding currently)   Chronic combined HFrEF: Recent echo with borderline LVEF 45-50%.  - Defer to outpatient setting whether need to initiate GDMT, but will manage volume with HD.    Hypothyroidism: TSH 1.8 - Continue synthroid   Gout: Quiescent. - Continue allopurinol.   Thrombocytopenia: Suspected to be due to covid, resolved.   Anemia of CKD: Stable from prior suspected baseline (8.2 > 8.7). Hgb of 12 suspected to be spurious. No active bleeding noted.   Consultants: Nephrology Procedures performed: HD 9/29  Disposition: Home Diet recommendation:  Cardiac and Carb modified diet, renal DISCHARGE MEDICATION: Allergies as of 06/23/2022  Reactions   Ambien [zolpidem Tartrate] Other (See Comments)   Hallucinations and "felt crazy"    Penicillins Hives, Rash   Has patient had a PCN reaction causing immediate rash,  facial/tongue/throat swelling, SOB or lightheadedness with hypotension: Yes Has patient had a PCN reaction causing severe rash involving mucus membranes or skin necrosis: Yes Has patient had a PCN reaction that required hospitalization: No Has patient had a PCN reaction occurring within the last 10 years: No If all of the above answers are "NO", then may proceed with Cephalosporin use.        Medication List     TAKE these medications    acetaminophen 325 MG tablet Commonly known as: TYLENOL Take 1-2 tablets (325-650 mg total) by mouth every 4 (four) hours as needed for mild pain. What changed:  how much to take when to take this   albuterol 108 (90 Base) MCG/ACT inhaler Commonly known as: VENTOLIN HFA Inhale 1-2 puffs into the lungs daily as needed for wheezing or shortness of breath.   allopurinol 100 MG tablet Commonly known as: ZYLOPRIM Take 100 mg by mouth daily.   camphor-menthol lotion Commonly known as: SARNA Apply 1 application topically 4 (four) times daily as needed for itching.   cetirizine 10 MG tablet Commonly known as: ZYRTEC Take 10 mg by mouth daily as needed for allergies.   cyanocobalamin 1000 MCG/ML injection Commonly known as: VITAMIN B12 Inject 1 mL (1,000 mcg total) into the muscle every 30 (thirty) days.   donepezil 10 MG tablet Commonly known as: ARICEPT Take 1 tablet (10 mg total) by mouth at bedtime.   doxercalciferol 4 MCG/2ML injection Commonly known as: HECTOROL Inject 2.5 mLs (5 mcg total) into the vein every Monday, Wednesday, and Friday with hemodialysis.   feeding supplement (NEPRO CARB STEADY) Liqd Take 237 mLs by mouth in the morning.   fluticasone 50 MCG/ACT nasal spray Commonly known as: FLONASE Place 1 spray into both nostrils daily. What changed:  how much to take when to take this reasons to take this   Forteo 600 MCG/2.4ML Sopn Generic drug: Teriparatide (Recombinant) INJECT 20 MCG UNDER THE SKIN DAILY (DISCARD 28  DAYS AFTER INITIAL USE) What changed: See the new instructions.   FreeStyle Freedom Lite w/Device Kit Use to check blood sugar 2 times per day dx code E11.65   freestyle lancets Use as instructed to check blood sugar 2 times per day dx code E11.65   FREESTYLE LITE test strip Generic drug: glucose blood USE AS DIRECTED THREE TIMES DAILY   hydrocortisone 2.5 % cream Apply 1 application topically 3 (three) times daily as needed (skin irritation (legs & arms)).   levothyroxine 50 MCG tablet Commonly known as: SYNTHROID Take 1 tablet (50 mcg total) by mouth daily. What changed: when to take this   memantine 10 MG tablet Commonly known as: NAMENDA Take 1 tablet (10 mg total) by mouth 2 (two) times daily.   midodrine 10 MG tablet Commonly known as: PROAMATINE Take one pill prior to hemodialysis on MWF What changed:  how much to take how to take this when to take this additional instructions   molnupiravir EUA 200 mg Caps capsule Commonly known as: LAGEVRIO Take 4 capsules (800 mg total) by mouth 2 (two) times daily for 5 days.   omeprazole 40 MG capsule Commonly known as: PRILOSEC Take 1 capsule (40 mg total) by mouth daily. What changed: when to take this   QUEtiapine 25 MG tablet Commonly known as: SEROquel  Take 1 tablet (25 mg total) by mouth at bedtime.   repaglinide 0.5 MG tablet Commonly known as: PRANDIN TAKE 1 TABLET(0.5 MG) BY MOUTH TWICE DAILY BEFORE A MEAL What changed: See the new instructions.   rosuvastatin 10 MG tablet Commonly known as: CRESTOR Take 1 tablet (10 mg total) by mouth daily.   sevelamer carbonate 800 MG tablet Commonly known as: RENVELA Take 1,600-2,400 mg by mouth See admin instructions. Take 2,400 mg by mouth three times a day with meals and 1,600 mg with snacks   SYRINGE 3CC/25GX1" 25G X 1" 3 ML Misc 1 each by Does not apply route every 30 (thirty) days.   Theratears 0.25 % Soln Generic drug: Carboxymethylcellulose Sodium Place  1 drop into both eyes 3 (three) times daily as needed (for dryness).   Tradjenta 5 MG Tabs tablet Generic drug: linagliptin Take 1 tablet (5 mg total) by mouth daily.        Follow-up Information     Baxley, Cresenciano Lick, MD. Schedule an appointment as soon as possible for a visit.   Specialty: Internal Medicine Contact information: [address] [place] [address] [place], [facility] Follow up.   Specialty: Home Health Services Why: [facility]mings will provide home health services including PT/OT and will call you in the next 24-48 hours after discharge to home to set up therapy times. Contact information: [place] [address] [phone]                Discharge Exam: BP (!) 117/43 (BP Location: Right Arm)   Pulse (!) 59   Temp 98.2 F (36.8 C) (Oral)   Resp 14   Wt 67.1 kg   SpO2 99%   BMI 23.17 kg/m   Elderly male in no distress RRR with no edema or JVD. AVF +thrill Clear, nonlabored on room air Alert, disoriented.   Condition at discharge: stable  The results of significant diagnostics from this hospitalization (including imaging, microbiology, ancillary and laboratory) are listed below for reference.   Imaging Studies: VAS [place] LOWER EXTREMITY VENOUS (DVT)  Result Date: 06/23/2022  Lower Venous DVT Study Patient Name:  TY BUNTROCK Pong  Date of Exam:   06/23/2022 Medical Rec #: [id number]       Accession #:    [id number] Date of Birth: 08-01-1941       Patient Gender: M Patient Age:   44 years Exam Location:  [facility] Procedure:      VAS [place] LOWER EXTREMITY VENOUS (DVT) Referring Phys: Vance Gather --------------------------------------------------------------------------------  Indications: Elevated ddimer.  Comparison Study: prior 04/11/21 Performing Technologist: Archie Patten RVS  Examination Guidelines: A complete evaluation includes B-mode imaging, spectral Doppler, color Doppler, and power  Doppler as needed of all accessible portions of each vessel. Bilateral testing is considered an integral part of a complete examination. Limited examinations for reoccurring indications may be performed as noted. The reflux portion of the exam is performed with the patient in reverse Trendelenburg.  +---------+---------------+---------+-----------+----------+-----------------+ RIGHT    CompressibilityPhasicitySpontaneityPropertiesThrombus Aging    +---------+---------------+---------+-----------+----------+-----------------+ CFV      Full           Yes      Yes                                    +---------+---------------+---------+-----------+----------+-----------------+ SFJ  Full                                                           +---------+---------------+---------+-----------+----------+-----------------+ FV Prox  Full                                                           +---------+---------------+---------+-----------+----------+-----------------+ FV Mid   Full                                                           +---------+---------------+---------+-----------+----------+-----------------+ FV DistalFull                                                           +---------+---------------+---------+-----------+----------+-----------------+ PFV      Full                                                           +---------+---------------+---------+-----------+----------+-----------------+ POP      Full           Yes      Yes                                    +---------+---------------+---------+-----------+----------+-----------------+ PTV      Full                                                           +---------+---------------+---------+-----------+----------+-----------------+ PERO     Full                                                            +---------+---------------+---------+-----------+----------+-----------------+ SSV      None                                         Age Indeterminate +---------+---------------+---------+-----------+----------+-----------------+   +---------+---------------+---------+-----------+----------+--------------+ LEFT     CompressibilityPhasicitySpontaneityPropertiesThrombus Aging +---------+---------------+---------+-----------+----------+--------------+ CFV      Full           Yes      Yes                                 +---------+---------------+---------+-----------+----------+--------------+  SFJ      Full                                                        +---------+---------------+---------+-----------+----------+--------------+ FV Prox  Full                                                        +---------+---------------+---------+-----------+----------+--------------+ FV Mid   Full                                                        +---------+---------------+---------+-----------+----------+--------------+ FV DistalFull                                                        +---------+---------------+---------+-----------+----------+--------------+ PFV      Full                                                        +---------+---------------+---------+-----------+----------+--------------+ POP      Full           Yes      Yes                                 +---------+---------------+---------+-----------+----------+--------------+ PTV      Full                                                        +---------+---------------+---------+-----------+----------+--------------+ PERO     Full                                                        +---------+---------------+---------+-----------+----------+--------------+    Summary: RIGHT: - Findings consistent with age indeterminate superficial vein thrombosis involving the right  small saphenous vein. - There is no evidence of deep vein thrombosis in the lower extremity.  - No cystic structure found in the popliteal fossa.  LEFT: - There is no evidence of deep vein thrombosis in the lower extremity.  - No cystic structure found in the popliteal fossa.  *See table(s) above for measurements and observations.    Preliminary    DG Chest 2 View  Result Date: 06/21/2022 CLINICAL DATA:  Fatigue and cough EXAM: CHEST - 2 VIEW COMPARISON:  Radiographs 06/14/2022 FINDINGS: Stable cardiomediastinal silhouette. Aortic atherosclerotic  calcification. Decreased small left pleural effusion and decreased left lower lobe opacity compared to 06/14/2022. The right lung is clear. No pneumothorax. No acute osseous abnormality. IMPRESSION: Decreased small left pleural effusion and lower lobe atelectasis versus infiltrates since 06/14/2022. Electronically Signed   By: Placido Sou M.D.   On: 06/21/2022 00:38   CT ABDOMEN PELVIS WO CONTRAST  Result Date: 06/14/2022 CLINICAL DATA:  Left lower quadrant abdominal pain EXAM: CT ABDOMEN AND PELVIS WITHOUT CONTRAST TECHNIQUE: Multidetector CT imaging of the abdomen and pelvis was performed following the standard protocol without IV contrast. RADIATION DOSE REDUCTION: This exam was performed according to the departmental dose-optimization program which includes automated exposure control, adjustment of the mA and/or kV according to patient size and/or use of iterative reconstruction technique. COMPARISON:  06/06/2022 FINDINGS: Lower chest: Trace left pleural effusion with mild dependent atelectasis. Heart size is normal. Aortic and coronary artery atherosclerosis. Right-sided gynecomastia. Hepatobiliary: Stable subcentimeter low-density lesions within the liver, too small to characterize. Liver otherwise within normal limits. Unremarkable gallbladder. No hyperdense gallstone. No biliary dilatation. Pancreas: Unremarkable. No pancreatic ductal dilatation or  surrounding inflammatory changes. Spleen: Normal in size without focal abnormality. Adrenals/Urinary Tract: Unremarkable adrenal glands. Bilateral renal atrophy. Numerous bilateral renal cysts, stable, and no dedicated imaging follow-up is recommended. No renal stone or hydronephrosis. Urinary bladder is decompressed. Stomach/Bowel: Stomach is within normal limits. Scattered colonic diverticulosis. No evidence of bowel wall thickening, distention, or inflammatory changes. Vascular/Lymphatic: Advanced atherosclerotic calcifications throughout the aortoiliac axis. Prior aorto bi-iliac stent graft, unable to be assessed on noncontrast imaging. No abdominopelvic lymphadenopathy. Reproductive: Prostate is unremarkable. Other: No free fluid. No abdominopelvic fluid collection. No pneumoperitoneum. Postsurgical changes to the anterior abdominal wall. Musculoskeletal: No interval change of lumbar vertebral body compression fractures involving L1, L2, L3, L4, and L5. Prior right hip ORIF. No new or acute bony findings. IMPRESSION: 1. No acute abdominopelvic findings. 2. Trace left pleural effusion with mild dependent atelectasis. 3. Colonic diverticulosis without evidence of acute diverticulitis. 4. Aortic and coronary artery atherosclerosis (ICD10-I70.0). Electronically Signed   By: Davina Poke D.O.   On: 06/14/2022 14:38   DG Chest 2 View  Result Date: 06/14/2022 CLINICAL DATA:  Chest pain. EXAM: CHEST - 2 VIEW COMPARISON:  CT chest dated June 06, 2022. Chest x-ray dated October 01, 2021. FINDINGS: The heart size and mediastinal contours are within normal limits. Normal pulmonary vascularity. Unchanged small left pleural effusion with adjacent left lower lobe opacity. The right lung is clear. No pneumothorax. No acute osseous abnormality. IMPRESSION: 1. Unchanged small left pleural effusion with adjacent left lower lobe atelectasis versus infiltrate. Electronically Signed   By: Titus Dubin M.D.   On:  06/14/2022 14:08   ECHOCARDIOGRAM COMPLETE  Result Date: 06/07/2022    ECHOCARDIOGRAM REPORT   Patient Name:   HANSFORD HIRT Momon Date of Exam: 06/07/2022 Medical Rec #:  [id number]      Height:       67.0 in Accession #:    [id number]     Weight:       156.6 lb Date of Birth:  [birth year]      BSA:          1.823 m Patient Age:    31 years       BP:           134/90 mmHg Patient Gender: M              HR:  52 bpm. Exam Location:  Inpatient Procedure: 2D Echo, Color Doppler and Cardiac Doppler Indications:    Chest pain/Elevated troponin  History:        Patient has prior history of Echocardiogram examinations, most                 recent 11/07/2020. CAD, Hx of AAA, Signs/Symptoms:Alzheimer's;                 Risk Factors:Diabetes, Dyslipidemia, Hypertension and Sleep                 Apnea.  Sonographer:    Eartha Inch Referring Phys: [id number] [place]  Sonographer Comments: Technically difficult study due to poor echo windows. Image acquisition challenging due to patient body habitus. Pt scanned supine. Could not be positioned in left lateral decubitus due to back pain. IMPRESSIONS  1. LV apical false tendon (normal variant). Left ventricular ejection fraction, by estimation, is 45 to 50%. The left ventricle has mildly decreased function. The left ventricle demonstrates global hypokinesis. There is mild left ventricular hypertrophy. Left ventricular diastolic parameters are consistent with Grade I diastolic dysfunction (impaired relaxation). Elevated left ventricular end-diastolic pressure. The E/e' is 8.  2. Right ventricular systolic function is normal. The right ventricular size is normal. There is mildly elevated pulmonary artery systolic pressure. The estimated right ventricular systolic pressure is 62.8 mmHg.  3. The mitral valve is abnormal. Mild to moderate mitral valve regurgitation.  4. The tricuspid valve is abnormal. Tricuspid valve regurgitation is mild to moderate.  5. The aortic  valve is tricuspid. Aortic valve regurgitation is not visualized.  6. The inferior vena cava is dilated in size with <50% respiratory variability, suggesting right atrial pressure of 15 mmHg. Comparison(s): Changes from prior study are noted. 11/07/2020: LVEF 55-60%, RVSP 40 mmHg, trivial MR. FINDINGS  Left Ventricle: LV apical false tendon (normal variant). Left ventricular ejection fraction, by estimation, is 45 to 50%. The left ventricle has mildly decreased function. The left ventricle demonstrates global hypokinesis. The left ventricular internal  cavity size was normal in size. There is mild left ventricular hypertrophy. Left ventricular diastolic parameters are consistent with Grade I diastolic dysfunction (impaired relaxation). Elevated left ventricular end-diastolic pressure. The E/e' is 68. Right Ventricle: The right ventricular size is normal. No increase in right ventricular wall thickness. Right ventricular systolic function is normal. There is mildly elevated pulmonary artery systolic pressure. The tricuspid regurgitant velocity is 2.52  m/s, and with an assumed right atrial pressure of 15 mmHg, the estimated right ventricular systolic pressure is 36.6 mmHg. Left Atrium: Left atrial size was normal in size. Right Atrium: Right atrial size was normal in size. Pericardium: There is no evidence of pericardial effusion. Mitral Valve: The mitral valve is abnormal. There is mild thickening of the anterior and posterior mitral valve leaflet(s). Mild mitral annular calcification. Mild to moderate mitral valve regurgitation. Tricuspid Valve: The tricuspid valve is abnormal. Tricuspid valve regurgitation is mild to moderate. Aortic Valve: The aortic valve is tricuspid. Aortic valve regurgitation is not visualized. Pulmonic Valve: The pulmonic valve was normal in structure. Pulmonic valve regurgitation is not visualized. Aorta: The aortic root and ascending aorta are structurally normal, with no evidence of  dilitation. Venous: The inferior vena cava is dilated in size with less than 50% respiratory variability, suggesting right atrial pressure of 15 mmHg. IAS/Shunts: No atrial level shunt detected by color flow Doppler.  LEFT VENTRICLE PLAX 2D LVIDd:  3.60 cm     Diastology LVIDs:         3.20 cm     LV e' medial:    5.33 cm/s LV PW:         1.20 cm     LV E/e' medial:  21.2 LV IVS:        1.20 cm     LV e' lateral:   6.53 cm/s LVOT diam:     2.10 cm     LV E/e' lateral: 17.3 LV SV:         73 LV SV Index:   40 LVOT Area:     3.46 cm  LV Volumes (MOD) LV vol d, MOD A2C: 89.0 ml LV vol s, MOD A2C: 52.5 ml LV SV MOD A2C:     36.5 ml RIGHT VENTRICLE             IVC RV S prime:     14.50 cm/s  IVC diam: 2.10 cm TAPSE (M-mode): 2.6 cm LEFT ATRIUM             Index        RIGHT ATRIUM           Index LA diam:        3.60 cm 1.98 cm/m   RA Area:     17.80 cm LA Vol (A2C):   45.1 ml 24.75 ml/m  RA Volume:   45.10 ml  24.75 ml/m LA Vol (A4C):   49.8 ml 27.32 ml/m LA Biplane Vol: 49.7 ml 27.27 ml/m  AORTIC VALVE LVOT Vmax:   95.00 cm/s LVOT Vmean:  62.500 cm/s LVOT VTI:    0.211 m  AORTA Ao Root diam: 3.40 cm Ao Asc diam:  3.30 cm MITRAL VALVE                  TRICUSPID VALVE MV Area (PHT): 3.74 cm       TR Peak grad:   25.4 mmHg MV Decel Time: 203 msec       TR Mean grad:   16.0 mmHg MR Peak grad:    141.1 mmHg   TR Vmax:        252.00 cm/s MR Mean grad:    102.0 mmHg   TR Vmean:       193.0 cm/s MR Vmax:         594.00 cm/s MR Vmean:        475.0 cm/s   SHUNTS MR PISA:         1.57 cm     Systemic VTI:  0.21 m MR PISA Eff ROA: 10 mm       Systemic Diam: 2.10 cm MR PISA Radius:  0.50 cm MV E velocity: 113.00 cm/s MV A velocity: 126.00 cm/s MV E/A ratio:  0.90 Lyman Bishop MD Electronically signed by Lyman Bishop MD Signature Date/Time: 06/07/2022/4:00:16 PM    Final    CT Angio Chest PE W and/or Wo Contrast  Result Date: 06/06/2022 CLINICAL DATA:  Chest pain short of breath left upper quadrant abdominal  pain EXAM: CT ANGIOGRAPHY CHEST, ABDOMEN AND PELVIS TECHNIQUE: Multidetector CT imaging through the chest, abdomen and pelvis was performed using the standard protocol during bolus administration of intravenous contrast. Multiplanar reconstructed images and MIPs were obtained and reviewed to evaluate the vascular anatomy. RADIATION DOSE REDUCTION: This exam was performed according to the departmental dose-optimization program which includes automated exposure control, adjustment of the mA and/or kV according to patient  size and/or use of iterative reconstruction technique. CONTRAST:  148m OMNIPAQUE IOHEXOL 350 MG/ML SOLN COMPARISON:  Radiograph 06/04/2022, CT abdomen pelvis 04/10/2022, 10/05/2021, CT chest 01/12/2021, CT chest 10/30/2017 FINDINGS: CTA CHEST FINDINGS Cardiovascular: Satisfactory opacification of the pulmonary arteries to the segmental level. No evidence of pulmonary embolism. Advanced aortic atherosclerosis. No aneurysm. Extensive coronary vascular calcification. Borderline cardiac size. No pericardial effusion. Mediastinum/Nodes: Midline trachea. No thyroid mass. No suspicious lymph nodes. Esophagus within normal limits. Calcified mediastinal and hilar nodes consistent with prior granulomatous disease. Lungs/Pleura: Mild emphysema. Small bilateral pleural effusions. Bandlike density at the left lung base likely reflects atelectasis. Partial posterior consolidation, atelectasis versus mild pneumonia. 5 mm ground-glass nodule at the lingula without change and felt benign, no follow-up imaging is recommended Musculoskeletal: Sternum is intact. Mild chronic compression deformities at T1, T3, and T8. Review of the MIP images confirms the above findings. 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 Diminutive heavily calcified native distal aorta and iliac vessels. Heavy calcification of the proximal abdominal aorta without aneurysm. Celiac:  Appears diminutive. 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 Variant anatomy. SMA: Heavily calcified proximal SMA limits evaluation for stenosis. Splenic and right hepatic arteries appear to take their origin from the SMA. No aneurysm, occlusion or dissection. Renals: Single right and single left renal arteries also heavily calcified. Suspect at least moderate stenosis of the proximal arteries. IMA: Poorly visualized due to artifact from surgical clips. Could potentially be chronically occluded, vascular flow enhancement better visualized on the delayed venous images. Inflow: Status post aortoiliac bypass with patency. Heavily calcified diminutive native vessels. Heavily calcified internal iliac vessels. Veins: No obvious venous abnormality within the limitations of this arterial phase study. Review of the MIP images confirms the above findings. NON-VASCULAR Hepatobiliary: No calcified gallstone. Multiple hypodense subcentimeter liver lesions, too small to further characterize. No biliary dilatation Pancreas: Unremarkable. No pancreatic ductal dilatation or surrounding inflammatory changes. Spleen: Normal in size without focal abnormality. Adrenals/Urinary Tract: Adrenal glands are normal. Atrophic kidneys without hydronephrosis. Multiple renal cystic lesions without highly suspicious lesion, no specific imaging follow-up is recommended. The urinary bladder is decompressed but slightly thick-walled and indistinct. Stomach/Bowel: The stomach is nonenlarged. No dilated small bowel. No acute bowel wall thickening. Lymphatic: No suspicious lymph nodes Reproductive: Prostate is unremarkable. Other: Negative for pelvic effusion or free air Musculoskeletal: Hardware in the right femur with artifact. Treated compression deformity at L3. Multiple chronic compression fractures involving L1, L2, L4 and L5. Review of the MIP images confirms the above findings. IMPRESSION: 1. Negative for acute pulmonary embolus. 2. Small  pleural effusions. Mild airspace disease at left lung base, favor atelectasis and or scar over pneumonia 3. No CT evidence for acute intra-abdominal or pelvic abnormality. Atrophic kidneys without hydronephrosis. 4. 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 Electronically Signed   By: KDonavan FoilM.D.   On: 06/06/2022 19:47   CT Angio Abd/Pel W and/or Wo Contrast  Result Date: 06/06/2022 CLINICAL DATA:  Chest pain short of breath left upper quadrant abdominal pain EXAM: CT ANGIOGRAPHY CHEST, ABDOMEN AND PELVIS TECHNIQUE: Multidetector CT imaging through the chest, abdomen and pelvis was performed using the standard protocol during bolus administration of intravenous contrast. Multiplanar reconstructed images and MIPs were obtained and reviewed to evaluate the vascular anatomy. RADIATION DOSE REDUCTION: This exam was performed according  to the departmental dose-optimization program which includes automated exposure control, adjustment of the mA and/or kV according to patient size and/or use of iterative reconstruction technique. CONTRAST:  13m OMNIPAQUE IOHEXOL 350 MG/ML SOLN COMPARISON:  Radiograph 06/04/2022, CT abdomen pelvis 04/10/2022, 10/05/2021, CT chest 01/12/2021, CT chest 10/30/2017 FINDINGS: CTA CHEST FINDINGS Cardiovascular: Satisfactory opacification of the pulmonary arteries to the segmental level. No evidence of pulmonary embolism. Advanced aortic atherosclerosis. No aneurysm. Extensive coronary vascular calcification. Borderline cardiac size. No pericardial effusion. Mediastinum/Nodes: Midline trachea. No thyroid mass. No suspicious lymph nodes. Esophagus within normal limits. Calcified mediastinal and hilar nodes consistent with prior granulomatous disease. Lungs/Pleura: Mild emphysema. Small bilateral pleural effusions. Bandlike density at the left lung  base likely reflects atelectasis. Partial posterior consolidation, atelectasis versus mild pneumonia. 5 mm ground-glass nodule at the lingula without change and felt benign, no follow-up imaging is recommended Musculoskeletal: Sternum is intact. Mild chronic compression deformities at T1, T3, and T8. Review of the MIP images confirms the above findings. 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 Diminutive heavily calcified native distal aorta and iliac vessels. Heavy calcification of the proximal abdominal aorta without aneurysm. Celiac: Appears diminutive. 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 Variant anatomy. SMA: Heavily calcified proximal SMA limits evaluation for stenosis. Splenic and right hepatic arteries appear to take their origin from the SMA. No aneurysm, occlusion or dissection. Renals: Single right and single left renal arteries also heavily calcified. Suspect at least moderate stenosis of the proximal arteries. IMA: Poorly visualized due to artifact from surgical clips. Could potentially be chronically occluded, vascular flow enhancement better visualized on the delayed venous images. Inflow: Status post aortoiliac bypass with patency. Heavily calcified diminutive native vessels. Heavily calcified internal iliac vessels. Veins: No obvious venous abnormality within the limitations of this arterial phase study. Review of the MIP images confirms the above findings. NON-VASCULAR Hepatobiliary: No calcified gallstone. Multiple hypodense subcentimeter liver lesions, too small to further characterize. No biliary dilatation Pancreas: Unremarkable. No pancreatic ductal dilatation or surrounding inflammatory changes. Spleen: Normal in size without focal abnormality. Adrenals/Urinary Tract: Adrenal glands are normal. Atrophic kidneys without hydronephrosis. Multiple renal cystic lesions without highly suspicious lesion, no specific imaging  follow-up is recommended. The urinary bladder is decompressed but slightly thick-walled and indistinct. Stomach/Bowel: The stomach is nonenlarged. No dilated small bowel. No acute bowel wall thickening. Lymphatic: No suspicious lymph nodes Reproductive: Prostate is unremarkable. Other: Negative for pelvic effusion or free air Musculoskeletal: Hardware in the right femur with artifact. Treated compression deformity at L3. Multiple chronic compression fractures involving L1, L2, L4 and L5. Review of the MIP images confirms the above findings. IMPRESSION: 1. Negative for acute pulmonary embolus. 2. Small pleural effusions. Mild airspace disease at left lung base, favor atelectasis and or scar over pneumonia 3. No CT evidence for acute intra-abdominal or pelvic abnormality. Atrophic kidneys without hydronephrosis. 4. 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 Electronically Signed   By: KDonavan FoilM.D.   On: 06/06/2022 19:47   DG Abd 2 Views  Result Date: 06/04/2022 CLINICAL DATA:  Abdominal pain EXAM: ABDOMEN - 2 VIEW COMPARISON:  01/27/2007 FINDINGS: Bowel gas pattern is nonspecific. There is presence of gas in few slightly dilated small bowel loops. Stomach is not distended. Gas and stool are present in colon. There is no fecal impaction in  rectum. There is no pneumoperitoneum. Extensive arterial calcifications are noted. There is vertebroplasty in L3 vertebra. There is decrease in height of multiple lumbar vertebral bodies. There is previous internal fixation in right femur. Surgical clips are seen in the para-aortic region and left inguinal region. IMPRESSION: Presence of gas in few slightly dilated small bowel loops may suggest ileus. There is no definite evidence of intestinal obstruction or pneumoperitoneum. Other findings as described in the body of the report.  Electronically Signed   By: Elmer Picker M.D.   On: 06/04/2022 17:30    Microbiology: Results for orders placed or performed during the hospital encounter of 06/20/22  Resp Panel by RT-PCR (Flu A&B, Covid) Anterior Nasal Swab     Status: Abnormal   Collection Time: 06/21/22 12:30 AM   Specimen: Anterior Nasal Swab  Result Value Ref Range Status   SARS Coronavirus 2 by RT PCR POSITIVE (A) NEGATIVE Final    Comment: (NOTE) SARS-CoV-2 target nucleic acids are DETECTED.  The SARS-CoV-2 RNA is generally detectable in upper respiratory specimens during the acute phase of infection. Positive results are indicative of the presence of the identified virus, but do not rule out bacterial infection or co-infection with other pathogens not detected by the test. Clinical correlation with patient history and other diagnostic information is necessary to determine patient infection status. The expected result is Negative.  Fact Sheet for Patients: EntrepreneurPulse.com.au  Fact Sheet for Healthcare Providers: IncredibleEmployment.be  This test is not yet approved or cleared by the [place] FDA and  has been authorized for detection and/or diagnosis of SARS-CoV-2 by FDA under an Emergency Use Authorization (EUA).  This EUA will remain in effect (meaning this test can be used) for the duration of  the COVID-19 declaration under Section 564(b)(1) of the A ct, 21 U.S.C. section 360bbb-3(b)(1), unless the authorization is terminated or revoked sooner.     Influenza A by PCR NEGATIVE NEGATIVE Final   Influenza B by PCR NEGATIVE NEGATIVE Final    Comment: (NOTE) The Xpert Xpress SARS-CoV-2/FLU/RSV plus assay is intended as an aid in the diagnosis of influenza from Nasopharyngeal swab specimens and should not be used as a sole basis for treatment. Nasal washings and aspirates are unacceptable for Xpert Xpress SARS-CoV-2/FLU/RSV testing.  Fact Sheet  for Patients: EntrepreneurPulse.com.au  Fact Sheet for Healthcare Providers: IncredibleEmployment.be  This test is not yet approved or cleared by the [place] FDA and has been authorized for detection and/or diagnosis of SARS-CoV-2 by FDA under an Emergency Use Authorization (EUA). This EUA will remain in effect (meaning this test can be used) for the duration of the COVID-19 declaration under Section 564(b)(1) of the Act, 21 U.S.C. section 360bbb-3(b)(1), unless the authorization is terminated or revoked.  Performed at [facility], [place] [address] [place], [place] [address]    *Note: Due to a large number of results and/or encounters for the requested time period, some results have not been displayed. A complete set of results can be found in Results Review.    Labs: CBC: Recent Labs  Lab 06/17/22 0647 06/21/22 0038 06/22/22 2218  WBC 4.4 6.4 4.5  NEUTROABS 2.0 4.5  --   HGB 12.0* 8.2* 8.7*  HCT 35.1* 25.7* 27.1*  MCV 95.9 103.2* 101.1*  PLT 155 129* 226   Basic Metabolic Panel: Recent Labs  Lab 06/17/22 0647 06/17/22 0931 06/21/22 0038 06/22/22 2218  NA 141 140 141 140  K 3.4* 4.3 3.6 4.7  CL 112* 98  97* 94*  CO2 22 28 31 29   GLUCOSE 85 106* 135* 167*  BUN 13 17 13  50*  CREATININE 0.95 5.89* 4.84* 8.92*  CALCIUM 8.2* 9.2 9.1 8.9  MG 2.1  --   --   --   PHOS 3.1  --   --  5.3*   Liver Function Tests: Recent Labs  Lab 06/17/22 0647 06/21/22 0038 06/22/22 2218  AST 12* 35  --   ALT 8 14  --   ALKPHOS 34* 115  --   BILITOT 0.8 0.4  --   PROT 5.4* 5.6*  --   ALBUMIN 3.0* 2.8* 2.7*   CBG: Recent Labs  Lab 06/22/22 0858 06/22/22 1221 06/22/22 1705 06/22/22 2216 06/23/22 1222  GLUCAP 146* 157* 146* 165* 68*    Discharge time spent: greater than 30 minutes.  Signed: Patrecia Pour, MD [facility] 06/23/2022

## 2022-06-23 NOTE — Progress Notes (Signed)
[facility] Progress Note    Subjective: Seen in room.  HD yesterday, tolerated well.  No complaints this AM  Objective Vitals:   06/23/22 0228 06/23/22 0234 06/23/22 0409 06/23/22 0806  BP: (!) 145/66 (!) 162/64 (!) 164/68 (!) 117/43  Pulse: 63 65 65 (!) 59  Resp: '18 18 18 14  '$ Temp:  97.7 F (36.5 C) 97.8 F (36.6 C) 98.2 F (36.8 C)  TempSrc:   Oral Oral  SpO2: 98% 99% 98% 99%  Weight:  67.1 kg     Physical Exam General: Appears less confused than yesterday, NAD Heart: S1,S2 RRR  Lungs: CTAB A/P. No WOB Abdomen: NABS, NT, ND Extremities: No LE edema.  Dialysis Access: R AVF + T/B   Additional Objective Labs: Basic Metabolic Panel: Recent Labs  Lab 06/17/22 0647 06/17/22 0931 06/21/22 0038 06/22/22 2218  NA 141 140 141 140  K 3.4* 4.3 3.6 4.7  CL 112* 98 97* 94*  CO2 '22 28 31 29  '$ GLUCOSE 85 106* 135* 167*  BUN '13 17 13 '$ 50*  CREATININE 0.95 5.89* 4.84* 8.92*  CALCIUM 8.2* 9.2 9.1 8.9  PHOS 3.1  --   --  5.3*   Liver Function Tests: Recent Labs  Lab 06/17/22 0647 06/21/22 0038 06/22/22 2218  AST 12* 35  --   ALT 8 14  --   ALKPHOS 34* 115  --   BILITOT 0.8 0.4  --   PROT 5.4* 5.6*  --   ALBUMIN 3.0* 2.8* 2.7*   Recent Labs  Lab 06/21/22 0038  LIPASE 47   CBC: Recent Labs  Lab 06/17/22 0647 06/21/22 0038 06/22/22 2218  WBC 4.4 6.4 4.5  NEUTROABS 2.0 4.5  --   HGB 12.0* 8.2* 8.7*  HCT 35.1* 25.7* 27.1*  MCV 95.9 103.2* 101.1*  PLT 155 129* 162   Blood Culture    Component Value Date/Time   SDES URINE, CLEAN CATCH 04/09/2022 2051   SPECREQUEST  04/09/2022 2051    NONE Performed at [facility], [place] [address], [place], [place] [address]    CULT MULTIPLE SPECIES PRESENT, SUGGEST RECOLLECTION (A) 04/09/2022 2051   REPTSTATUS 04/11/2022 FINAL 04/09/2022 2051    Cardiac Enzymes: No results for input(s): "CKTOTAL", "CKMB", "CKMBINDEX", "TROPONINI" in the last 168 hours. CBG: Recent Labs  Lab 06/21/22 2140  06/22/22 0858 06/22/22 1221 06/22/22 1705 06/22/22 2216  GLUCAP 96 146* 157* 146* 165*   Iron Studies:  Recent Labs    06/21/22 1120  FERRITIN 388*   '@lablastinr3'$ @ Studies/Results: No results found. Medications:   allopurinol  100 mg Oral Daily   Chlorhexidine Gluconate Cloth  6 each Topical Q0600   doxercalciferol  9 mcg Intravenous Q M,W,F-HD   feeding supplement (NEPRO CARB STEADY)  237 mL Oral TID WC   heparin  5,000 Units Subcutaneous Q8H   insulin aspart  0-5 Units Subcutaneous QHS   insulin aspart  0-6 Units Subcutaneous TID WC   ipratropium  2 puff Inhalation Q6H   levothyroxine  50 mcg Oral QAC breakfast   memantine  10 mg Oral BID   methylPREDNISolone (SOLU-MEDROL) injection  60 mg Intravenous Daily   midodrine  10 mg Oral Q M,W,F-HD   molnupiravir EUA  4 capsule Oral BID   pantoprazole  40 mg Oral Daily   QUEtiapine  25 mg Oral QHS   rosuvastatin  10 mg Oral Daily   sevelamer carbonate  2,400 mg Oral TID WC   And   sevelamer  carbonate  1,600 mg Oral With snacks   Outpatient Dialysis Orders: Center: [facility]  on MWF. 3:75 hrs 180NRe 400/Auto 1.5 EDW 70 kg 2K 2Ca AVF   -No heparin -Mircera 186mg IV q 2 weeks- last dose 12173m on 9/18 -Hectorol 73m13mIV TIW   Assessment/Plan: COVID 19-Started on Molnupiravir, solumedrol per primary.   ESRD:  MWF schedule. S/p HD 06/22/2022. No heparin. Next HD Monday  Hypertension/volume: BP controlled, no volume overload on exam or CXR.  EDW recently lowered at OP unit. On midodrine '10mg'$  PO pre HD, will continue here.   Anemia: Hb 8.3.  History of recurrent GI bleed.  No heparin with HD. Continue ESA.   Metabolic bone disease: Corrected calcium elevated on arrival, reduced hectorol dose and calcium is not at goal. Phos at goal, continue renvela  Nutrition:  Renal diet/fluid restriction  H/O AFib-Currently SR. No AC.  H/O CAD H/O Dementia-meds per primary HRrEF-Optimize volume with HD.   EliMadelon Lips 06/23/2022, 9:30 AM  [facility] [phone]

## 2022-06-23 NOTE — Progress Notes (Signed)
Lower extremity venous has been completed.   Preliminary results in CV Proc.   [name] 06/23/2022 10:27 AM

## 2022-06-23 NOTE — TOC Transition Note (Signed)
Transition of Care [facility]) - CM/SW Discharge Note   Patient Details  Name: BRYON PARKER MRN: [id number] Date of Birth: [birth year]  [facility] CM/SW Contact:  Carles Collet, RN Phone Number: 06/23/2022, 9:59 AM   Clinical Narrative:     Kaylyn Layer of DC today. No other TC needs identified  Final next level of care: [place] Barriers to Discharge: Continued Medical Work up   Patient Goals and CMS Choice Patient states their goals for this hospitalization and ongoing recovery are:: to return home CMS Medicare.gov Compare Post Acute Care list provided to:: Patient Represenative (must comment) (Patient's wife) Choice offered to / list presented to : Spouse  Discharge Placement                       Discharge Plan and Services   Discharge Planning Services: CM Consult Post Acute Care Choice: Home Health                    HH Arranged: PT, OT [facility] Agency: [place] Date [place]: 06/22/22 Time [place]: 6967 Representative spoke with at [place]: Tommi Rumps, [place] with [place]  Social Determinants of Health (SDOH) Interventions     Readmission Risk Interventions    06/22/2022    1:15 PM 06/15/2022    3:42 PM 06/10/2022    2:43 PM  Readmission Risk Prevention Plan  Transportation Screening Complete Complete Complete  Medication Review Press photographer) Complete Complete Complete  PCP or Specialist appointment within 3-5 days of discharge Complete Complete Complete  HRI or Home Care Consult Complete Complete Complete  SW Recovery Care/Counseling Consult Complete Complete Complete  Palliative Care Screening Not Applicable Not Applicable Not [place] Not Applicable Not Applicable Not Applicable

## 2022-06-24 DIAGNOSIS — N186 End stage renal disease: Secondary | ICD-10-CM | POA: Diagnosis not present

## 2022-06-24 DIAGNOSIS — Z992 Dependence on renal dialysis: Secondary | ICD-10-CM | POA: Diagnosis not present

## 2022-06-24 DIAGNOSIS — E1122 Type 2 diabetes mellitus with diabetic chronic kidney disease: Secondary | ICD-10-CM | POA: Diagnosis not present

## 2022-06-24 DIAGNOSIS — U071 COVID-19: Secondary | ICD-10-CM | POA: Diagnosis not present

## 2022-06-24 LAB — GLUCOSE, CAPILLARY
Glucose-Capillary: 82 mg/dL (ref 70–99)
Glucose-Capillary: 76 mg/dL (ref 70–99)

## 2022-06-24 NOTE — Progress Notes (Signed)
PROGRESS NOTE  Subjective: No complaints, no overnight events. He denies dyspnea and isn't coughing.   Objective: BP (!) 144/64 (BP Location: Left Wrist)   Pulse 61   Temp 97.7 F (36.5 C) (Oral)   Resp 15   Wt 67 kg   SpO2 96%   BMI 23.13 kg/m   Gen: Elderly, frail male in no distress. He appears much scrawnier than his Sports coach.  Pulm: Clear and nonlabored on room air  CV: RRR, no murmur, no JVD, no edema  Assessment & Plan: 81yo M admitted for covid-19-related generalized weakness which has improved. He will continue molnupiravir. Home health is arranged with [facility] and he's getting around well enough per PT/OT to functionally return home safely. Spoke with his wife as well who has covid, is 53 with a bad back, but she's feeling ok. Confirmed with nephrology that the patient would have no barrier to receiving his typical outpatient dialysis tomorrow. He remains stable for discharge and his wife will be able to come pick him up later today.   Patrecia Pour, MD Pager on amion 06/24/2022, 11:04 AM

## 2022-06-24 NOTE — Plan of Care (Signed)

## 2022-06-25 ENCOUNTER — Telehealth: Payer: Self-pay

## 2022-06-25 NOTE — Patient Outreach (Signed)
  Care Coordination TOC Note Transition Care Management Follow-up Telephone Call Date of discharge and from where: 06/24/22-[facility] dx: COVID infection How have you been since you were released from the hospital? Spoke with spouse-she reports patient is resting and doing well. He just returned home yesterday evening and still getting settled in. He is going to go to dialysis Tues,Thurs this week instead of M,W,F normal schedule so he can rest.  Any questions or concerns? No  Items Reviewed: Did the pt receive and understand the discharge instructions provided? Yes  Medications obtained and verified? Yes  Other? No  Any new allergies since your discharge? No  Dietary orders reviewed? Yes-renal, heart healthy Do you have support at home? Yes -spouse  Home Care and Equipment/Supplies: Were home health services ordered? yes If so, what is the name of the agency? [facility]  Has the agency set up a time to come to the patient's home? no Were any new equipment or medical supplies ordered?  No What is the name of the medical supply agency? N/A Were you able to get the supplies/equipment? not applicable Do you have any questions related to the use of the equipment or supplies? No  Functional Questionnaire: (I = Independent and D = Dependent) ADLs: Assist  Bathing/Dressing- Assist  Meal Prep- Assist  Eating- I  Maintaining continence- I  Transferring/Ambulation- assist  Managing Meds- assist  Follow up appointments reviewed:  PCP Hospital f/u appt confirmed?  Patient just discharged yesterday. Wife will call office today to make an appt.  Marland Kitchen [place] [facility] f/u appt confirmed?  Wife will call and make follow up appts today  SIf their condition worsens, is the pt aware to call PCP or go to the Emergency Dept.? Yes Was the patient provided with contact information for the PCP's office or ED? Yes Was to pt encouraged to call back with questions or concerns? Yes  SDOH assessments  and interventions completed:   Yes  Care Coordination Interventions Activated:  No   Care Coordination Interventions:  No Care Coordination interventions needed at this time.   Encounter Outcome:  Pt. Visit Completed    Enzo Montgomery, RN,BSN,CCM [place] Management Telephonic Care Management Coordinator Direct Phone: [phone] Toll Free: [phone] Fax: [phone]

## 2022-06-25 NOTE — Progress Notes (Signed)
Late Note Entry:  Contacted [facility] to make clinic aware of pt's d/c yesterday and that pt should resume today.   Melven Sartorius Renal Navigator [phone]

## 2022-06-26 ENCOUNTER — Telehealth: Payer: Self-pay

## 2022-06-26 ENCOUNTER — Encounter: Payer: Self-pay | Admitting: Internal Medicine

## 2022-06-26 ENCOUNTER — Inpatient Hospital Stay: Payer: Medicare Other | Admitting: Internal Medicine

## 2022-06-26 ENCOUNTER — Telehealth (INDEPENDENT_AMBULATORY_CARE_PROVIDER_SITE_OTHER): Payer: Medicare Other | Admitting: Internal Medicine

## 2022-06-26 VITALS — BP 190/162 | HR 68 | Temp 97.7°F

## 2022-06-26 DIAGNOSIS — N2581 Secondary hyperparathyroidism of renal origin: Secondary | ICD-10-CM | POA: Diagnosis not present

## 2022-06-26 DIAGNOSIS — E039 Hypothyroidism, unspecified: Secondary | ICD-10-CM

## 2022-06-26 DIAGNOSIS — N186 End stage renal disease: Secondary | ICD-10-CM

## 2022-06-26 DIAGNOSIS — I4891 Unspecified atrial fibrillation: Secondary | ICD-10-CM | POA: Diagnosis not present

## 2022-06-26 DIAGNOSIS — E876 Hypokalemia: Secondary | ICD-10-CM | POA: Diagnosis not present

## 2022-06-26 DIAGNOSIS — Z992 Dependence on renal dialysis: Secondary | ICD-10-CM

## 2022-06-26 DIAGNOSIS — E538 Deficiency of other specified B group vitamins: Secondary | ICD-10-CM

## 2022-06-26 DIAGNOSIS — I10 Essential (primary) hypertension: Secondary | ICD-10-CM | POA: Diagnosis not present

## 2022-06-26 DIAGNOSIS — R413 Other amnesia: Secondary | ICD-10-CM | POA: Diagnosis not present

## 2022-06-26 DIAGNOSIS — D631 Anemia in chronic kidney disease: Secondary | ICD-10-CM | POA: Diagnosis not present

## 2022-06-26 DIAGNOSIS — E1129 Type 2 diabetes mellitus with other diabetic kidney complication: Secondary | ICD-10-CM | POA: Diagnosis not present

## 2022-06-26 DIAGNOSIS — U071 COVID-19: Secondary | ICD-10-CM | POA: Diagnosis not present

## 2022-06-26 DIAGNOSIS — E119 Type 2 diabetes mellitus without complications: Secondary | ICD-10-CM | POA: Diagnosis not present

## 2022-06-26 NOTE — Telephone Encounter (Signed)
Physical therapy called asking for VO for PT 2x 1 and 1x4

## 2022-06-26 NOTE — Patient Instructions (Addendum)
Patient is recovering from acute COVID-19 virus infection and he has multiple medical issues that are very complex.  He was discharged home after a brief hospitalization recently.  Wife had hoped for nursing home placement.  She will try to manage him at home at the present time with palliative care services and home health.  He needs to get B12 IM monthly for history of B12 deficiency.  He has dementia, recurrent episodes of atrial fibs, deconditioning, hyperlipidemia, gout, agitation treated with Seroquel as well as end-stage kidney disease.  We will follow-up here as needed.

## 2022-06-26 NOTE — Progress Notes (Signed)
Subjective:    Patient ID: [name], male    DOB: 02-06-1941, 81 y.o.   MRN: [id number]  HPI 81 year old Black Male seen today via interactive audio and video telecommunications.  He is at his home with his wife present, and I am at my office.  He is agreeable to visit in this format today.  He is identified using 2 identifiers as [name], a patient in this practice.    Patient has had a rough time lately.  He was recently discharged from the hospital having been admitted September 27 with acute COVID-19 virus infection.  His wife subsequently came down with COVID as well.    Prior to that he had had 2 previous hospitalizations on September 13 and  September 21.  The admission on September 13 was for atrial fibrillation and he was felt not to be an anticoagulation candidate due to history of GI bleed, history of subdural hematoma and chronic anemia.  He received dialysis in the hospital on September 15 and was followed by nephrology during that hospitalization.  He was also found to be in atrial fibrillation and was started on amiodarone.  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  He has a history of type 2 diabetes mellitus, hyperlipidemia and hypothyroidism.  The admission on September 21 was for chest pain.  He was not felt to be a candidate for invasive work-up given his dementia and anemia.  He had an elevated troponin but that was thought to be due to demand ischemia.  Hemoglobin was 8.8 g.  Ejection fraction was estimated at 45 to 50%.  He was in atrial fibrillation with rapid ventricular response when he presented to the emergency department September 21.  Although his troponin was elevated greater than 300 there was no evidence of acute ischemia on twelve-lead EKG therefore given the impression of suspected demand ischemia in the setting of atrial fib with RVR.  2D echocardiogram on September 14 revealed his left ventricular ejection fraction to be 45 to 50% with  left ventricle global kinesis.  He receives Palliative care services at home.  Wife is considered nursing home placement but is not sure which facilities would suit him best.  She really needs to be advised by Education officer, museum.  Says there is not a Education officer, museum available to help her at dialysis.  He has been tested by Dr. Sima Matas, no neuropsychologist.  He takes Namenda for memory loss.  He is on thyroid replacement medication for hypothyroidism.  He is taking Aricept.  He has impaired glucose tolerance treated Prandin and is well as Tradjenta by Dr. Dwyane Dee and hyperlipidemia treated with generic Crestor 10 mg daily.  History of hypothyroidism treated with thyroid replacement medication.  He is on Forteo for osteoporosis and T score in 2019 was -4.6    Review of Systems see above     Objective:   Physical Exam He is seen virtually in no acute distress.  I do believe he recognizes my face but is not able to call my name.  He does not appear to be tachypneic or in any acute distress.  He does look weak and frail.  He is disoriented to day of week and year.  Wife says that dialysis really makes him quite tired for about 24 hours.  She will keep a close eye on him and let us know if there are any significant changes or if he needs to go back to the hospital.  She says that he ate very little over the past 24 hours.       Assessment & Plan:  End-stage kidney disease on hemodialysis receiving palliative care services  COVID-19 virus infection-last vaccines were in 2021  History of atrial fib with RVR-admitted recently  Dementia treat with Aricept and Namenda  B12 deficiency-wife is having someone come in monthly and give him IM B12 injections as he has been found to have low B12 level in the past  Agitation currently treated with Seroquel 25 mg at bedtime  He has B12 deficiency treated with IM B12 monthly  History of type 2 diabetes mellitus and hemoglobin A1c in August was 6.5%-stable on  current regimen per Dr. Dwyane Dee  Hyperlipidemia treated with rosuvastatin 10 mg daily  History of gout treated with allopurinol    Impaired glucose tolerance treated with Tradjenta 5 mg daily and hemoglobin A1c August 24 was excellent at 6.5%  Plan: I have spoken with his wife recently about Hospice services as opposed to Palliative care services.  She says she is not quite ready for him to have hospice care.  She would like to change his dialysis that he receives 3 times a week to a different center if possible.  Nephrology does not see him all that often.  She has tried speaking with staff at dialysis but feels she does not get a lot of information.  GE reflux treated with Prilosec 40 mg daily  History of gout treated with allopurinol

## 2022-06-28 DIAGNOSIS — E876 Hypokalemia: Secondary | ICD-10-CM | POA: Diagnosis not present

## 2022-06-28 DIAGNOSIS — N186 End stage renal disease: Secondary | ICD-10-CM | POA: Diagnosis not present

## 2022-06-28 DIAGNOSIS — N2581 Secondary hyperparathyroidism of renal origin: Secondary | ICD-10-CM | POA: Diagnosis not present

## 2022-06-28 DIAGNOSIS — D631 Anemia in chronic kidney disease: Secondary | ICD-10-CM | POA: Diagnosis not present

## 2022-06-28 DIAGNOSIS — E1129 Type 2 diabetes mellitus with other diabetic kidney complication: Secondary | ICD-10-CM | POA: Diagnosis not present

## 2022-06-28 DIAGNOSIS — Z992 Dependence on renal dialysis: Secondary | ICD-10-CM | POA: Diagnosis not present

## 2022-06-28 LAB — HEPATITIS B CORE ANTIBODY, TOTAL

## 2022-06-29 ENCOUNTER — Telehealth: Payer: Self-pay | Admitting: Internal Medicine

## 2022-06-29 DIAGNOSIS — R079 Chest pain, unspecified: Secondary | ICD-10-CM | POA: Diagnosis not present

## 2022-06-29 DIAGNOSIS — J189 Pneumonia, unspecified organism: Secondary | ICD-10-CM | POA: Diagnosis not present

## 2022-06-29 DIAGNOSIS — J40 Bronchitis, not specified as acute or chronic: Secondary | ICD-10-CM | POA: Diagnosis not present

## 2022-06-29 DIAGNOSIS — I2510 Atherosclerotic heart disease of native coronary artery without angina pectoris: Secondary | ICD-10-CM | POA: Diagnosis not present

## 2022-06-29 DIAGNOSIS — N186 End stage renal disease: Secondary | ICD-10-CM | POA: Diagnosis not present

## 2022-06-29 DIAGNOSIS — I5023 Acute on chronic systolic (congestive) heart failure: Secondary | ICD-10-CM | POA: Diagnosis not present

## 2022-06-29 DIAGNOSIS — I480 Paroxysmal atrial fibrillation: Secondary | ICD-10-CM | POA: Diagnosis not present

## 2022-06-29 DIAGNOSIS — R918 Other nonspecific abnormal finding of lung field: Secondary | ICD-10-CM | POA: Diagnosis not present

## 2022-06-29 DIAGNOSIS — E1122 Type 2 diabetes mellitus with diabetic chronic kidney disease: Secondary | ICD-10-CM | POA: Diagnosis not present

## 2022-06-29 DIAGNOSIS — I130 Hypertensive heart and chronic kidney disease with heart failure and stage 1 through stage 4 chronic kidney disease, or unspecified chronic kidney disease: Secondary | ICD-10-CM | POA: Diagnosis not present

## 2022-06-29 NOTE — Telephone Encounter (Signed)
I called to check on [name] and she told me that [name] had an episode this morning while [facility] was there where the right side of his chest hurt and his right arm. The therapist did Bills vitals and they were all normal, he could not find anything wrong so they chose to keep him home and watch him. That is what she is doing, if anything changes she will call 911 and send him to hospital. I did let her know you were out of office until Tuesday.

## 2022-06-30 DIAGNOSIS — N186 End stage renal disease: Secondary | ICD-10-CM | POA: Diagnosis not present

## 2022-06-30 DIAGNOSIS — N2581 Secondary hyperparathyroidism of renal origin: Secondary | ICD-10-CM | POA: Diagnosis not present

## 2022-06-30 DIAGNOSIS — Z992 Dependence on renal dialysis: Secondary | ICD-10-CM | POA: Diagnosis not present

## 2022-06-30 DIAGNOSIS — E876 Hypokalemia: Secondary | ICD-10-CM | POA: Diagnosis not present

## 2022-06-30 DIAGNOSIS — E1129 Type 2 diabetes mellitus with other diabetic kidney complication: Secondary | ICD-10-CM | POA: Diagnosis not present

## 2022-06-30 DIAGNOSIS — D631 Anemia in chronic kidney disease: Secondary | ICD-10-CM | POA: Diagnosis not present

## 2022-07-02 DIAGNOSIS — Z992 Dependence on renal dialysis: Secondary | ICD-10-CM | POA: Diagnosis not present

## 2022-07-02 DIAGNOSIS — E876 Hypokalemia: Secondary | ICD-10-CM | POA: Diagnosis not present

## 2022-07-02 DIAGNOSIS — D631 Anemia in chronic kidney disease: Secondary | ICD-10-CM | POA: Diagnosis not present

## 2022-07-02 DIAGNOSIS — N186 End stage renal disease: Secondary | ICD-10-CM | POA: Diagnosis not present

## 2022-07-02 DIAGNOSIS — N2581 Secondary hyperparathyroidism of renal origin: Secondary | ICD-10-CM | POA: Diagnosis not present

## 2022-07-02 DIAGNOSIS — E1129 Type 2 diabetes mellitus with other diabetic kidney complication: Secondary | ICD-10-CM | POA: Diagnosis not present

## 2022-07-04 DIAGNOSIS — E876 Hypokalemia: Secondary | ICD-10-CM | POA: Diagnosis not present

## 2022-07-04 DIAGNOSIS — Z992 Dependence on renal dialysis: Secondary | ICD-10-CM | POA: Diagnosis not present

## 2022-07-04 DIAGNOSIS — N186 End stage renal disease: Secondary | ICD-10-CM | POA: Diagnosis not present

## 2022-07-04 DIAGNOSIS — D631 Anemia in chronic kidney disease: Secondary | ICD-10-CM | POA: Diagnosis not present

## 2022-07-04 DIAGNOSIS — N2581 Secondary hyperparathyroidism of renal origin: Secondary | ICD-10-CM | POA: Diagnosis not present

## 2022-07-04 DIAGNOSIS — E1129 Type 2 diabetes mellitus with other diabetic kidney complication: Secondary | ICD-10-CM | POA: Diagnosis not present

## 2022-07-05 DIAGNOSIS — I5023 Acute on chronic systolic (congestive) heart failure: Secondary | ICD-10-CM | POA: Diagnosis not present

## 2022-07-05 DIAGNOSIS — I2510 Atherosclerotic heart disease of native coronary artery without angina pectoris: Secondary | ICD-10-CM | POA: Diagnosis not present

## 2022-07-05 DIAGNOSIS — E1122 Type 2 diabetes mellitus with diabetic chronic kidney disease: Secondary | ICD-10-CM | POA: Diagnosis not present

## 2022-07-05 DIAGNOSIS — N186 End stage renal disease: Secondary | ICD-10-CM | POA: Diagnosis not present

## 2022-07-05 DIAGNOSIS — I480 Paroxysmal atrial fibrillation: Secondary | ICD-10-CM | POA: Diagnosis not present

## 2022-07-05 DIAGNOSIS — I130 Hypertensive heart and chronic kidney disease with heart failure and stage 1 through stage 4 chronic kidney disease, or unspecified chronic kidney disease: Secondary | ICD-10-CM | POA: Diagnosis not present

## 2022-07-06 ENCOUNTER — Telehealth: Payer: Self-pay | Admitting: Internal Medicine

## 2022-07-06 DIAGNOSIS — R531 Weakness: Secondary | ICD-10-CM | POA: Diagnosis not present

## 2022-07-06 DIAGNOSIS — N2581 Secondary hyperparathyroidism of renal origin: Secondary | ICD-10-CM | POA: Diagnosis not present

## 2022-07-06 DIAGNOSIS — G3184 Mild cognitive impairment, so stated: Secondary | ICD-10-CM | POA: Diagnosis not present

## 2022-07-06 DIAGNOSIS — E876 Hypokalemia: Secondary | ICD-10-CM | POA: Diagnosis not present

## 2022-07-06 DIAGNOSIS — D631 Anemia in chronic kidney disease: Secondary | ICD-10-CM | POA: Diagnosis not present

## 2022-07-06 DIAGNOSIS — E1129 Type 2 diabetes mellitus with other diabetic kidney complication: Secondary | ICD-10-CM | POA: Diagnosis not present

## 2022-07-06 DIAGNOSIS — N186 End stage renal disease: Secondary | ICD-10-CM | POA: Diagnosis not present

## 2022-07-06 DIAGNOSIS — E440 Moderate protein-calorie malnutrition: Secondary | ICD-10-CM | POA: Diagnosis not present

## 2022-07-06 DIAGNOSIS — Z992 Dependence on renal dialysis: Secondary | ICD-10-CM | POA: Diagnosis not present

## 2022-07-06 NOTE — Telephone Encounter (Signed)
Faxed signed resumption of care orders to [facility] [phone], phone [phone]  Order # [id number]

## 2022-07-06 NOTE — Telephone Encounter (Signed)
This message was sent via [place], a product from [employer] http://www.biscom.com/                    -------Fax Transmission Report-------  To:               Recipient at [phone] Subject:          FW: Hp Scans Result:           The transmission was successful. Explanation:      All Pages Ok Pages Sent:       10 Connect Time:     6 minutes, 23 seconds Transmit Time:    07/06/2022 11:07 Transfer Rate:    14400 Status Code:      0000 Retry Count:      0 Job Id:           [id number] Unique Id:        [id number] Fax Line:         6 Fax Server:       [employer]

## 2022-07-09 ENCOUNTER — Telehealth: Payer: Self-pay | Admitting: Internal Medicine

## 2022-07-09 DIAGNOSIS — N186 End stage renal disease: Secondary | ICD-10-CM | POA: Diagnosis not present

## 2022-07-09 DIAGNOSIS — E876 Hypokalemia: Secondary | ICD-10-CM | POA: Diagnosis not present

## 2022-07-09 DIAGNOSIS — N2581 Secondary hyperparathyroidism of renal origin: Secondary | ICD-10-CM | POA: Diagnosis not present

## 2022-07-09 DIAGNOSIS — Z992 Dependence on renal dialysis: Secondary | ICD-10-CM | POA: Diagnosis not present

## 2022-07-09 DIAGNOSIS — E1129 Type 2 diabetes mellitus with other diabetic kidney complication: Secondary | ICD-10-CM | POA: Diagnosis not present

## 2022-07-09 DIAGNOSIS — D631 Anemia in chronic kidney disease: Secondary | ICD-10-CM | POA: Diagnosis not present

## 2022-07-09 NOTE — Telephone Encounter (Signed)
This message was sent via [place], a product from [employer] http://www.bis[employer].com/                    -------Fax Transmission Report-------  To:               Recipient at [phone] Subject:          FW: Hp Scans Result:           The transmission was successful. Explanation:      All Pages Ok Pages Sent:       5 Connect Time:     3 minutes, 20 seconds Transmit Time:    07/06/2022 17:08 Transfer Rate:    14400 Status Code:      0000 Retry Count:      0 Job Id:           [id number] Unique Id:        [id number] Fax Line:         2 Fax Server:       [employer]

## 2022-07-09 NOTE — Telephone Encounter (Signed)
Faxed signed orders to [facility] [phone], phone [phone]  Order # [id number]  SN effective 07/01/22 [phone]

## 2022-07-10 DIAGNOSIS — I480 Paroxysmal atrial fibrillation: Secondary | ICD-10-CM | POA: Diagnosis not present

## 2022-07-10 DIAGNOSIS — N186 End stage renal disease: Secondary | ICD-10-CM | POA: Diagnosis not present

## 2022-07-10 DIAGNOSIS — E1122 Type 2 diabetes mellitus with diabetic chronic kidney disease: Secondary | ICD-10-CM | POA: Diagnosis not present

## 2022-07-10 DIAGNOSIS — I5023 Acute on chronic systolic (congestive) heart failure: Secondary | ICD-10-CM | POA: Diagnosis not present

## 2022-07-10 DIAGNOSIS — I130 Hypertensive heart and chronic kidney disease with heart failure and stage 1 through stage 4 chronic kidney disease, or unspecified chronic kidney disease: Secondary | ICD-10-CM | POA: Diagnosis not present

## 2022-07-10 DIAGNOSIS — I2510 Atherosclerotic heart disease of native coronary artery without angina pectoris: Secondary | ICD-10-CM | POA: Diagnosis not present

## 2022-07-11 DIAGNOSIS — Z992 Dependence on renal dialysis: Secondary | ICD-10-CM | POA: Diagnosis not present

## 2022-07-11 DIAGNOSIS — E1129 Type 2 diabetes mellitus with other diabetic kidney complication: Secondary | ICD-10-CM | POA: Diagnosis not present

## 2022-07-11 DIAGNOSIS — E876 Hypokalemia: Secondary | ICD-10-CM | POA: Diagnosis not present

## 2022-07-11 DIAGNOSIS — N2581 Secondary hyperparathyroidism of renal origin: Secondary | ICD-10-CM | POA: Diagnosis not present

## 2022-07-11 DIAGNOSIS — D631 Anemia in chronic kidney disease: Secondary | ICD-10-CM | POA: Diagnosis not present

## 2022-07-11 DIAGNOSIS — N186 End stage renal disease: Secondary | ICD-10-CM | POA: Diagnosis not present

## 2022-07-12 DIAGNOSIS — I5032 Chronic diastolic (congestive) heart failure: Secondary | ICD-10-CM | POA: Diagnosis not present

## 2022-07-12 DIAGNOSIS — E440 Moderate protein-calorie malnutrition: Secondary | ICD-10-CM | POA: Diagnosis not present

## 2022-07-12 DIAGNOSIS — I272 Pulmonary hypertension, unspecified: Secondary | ICD-10-CM | POA: Diagnosis not present

## 2022-07-12 DIAGNOSIS — E782 Mixed hyperlipidemia: Secondary | ICD-10-CM | POA: Diagnosis not present

## 2022-07-12 DIAGNOSIS — I700 Atherosclerosis of aorta: Secondary | ICD-10-CM | POA: Diagnosis not present

## 2022-07-12 DIAGNOSIS — D631 Anemia in chronic kidney disease: Secondary | ICD-10-CM | POA: Diagnosis not present

## 2022-07-12 DIAGNOSIS — I440 Atrioventricular block, first degree: Secondary | ICD-10-CM | POA: Diagnosis not present

## 2022-07-12 DIAGNOSIS — E1151 Type 2 diabetes mellitus with diabetic peripheral angiopathy without gangrene: Secondary | ICD-10-CM | POA: Diagnosis not present

## 2022-07-12 DIAGNOSIS — J9811 Atelectasis: Secondary | ICD-10-CM | POA: Diagnosis not present

## 2022-07-12 DIAGNOSIS — F02818 Dementia in other diseases classified elsewhere, unspecified severity, with other behavioral disturbance: Secondary | ICD-10-CM | POA: Diagnosis not present

## 2022-07-12 DIAGNOSIS — U071 COVID-19: Secondary | ICD-10-CM | POA: Diagnosis not present

## 2022-07-12 DIAGNOSIS — E039 Hypothyroidism, unspecified: Secondary | ICD-10-CM | POA: Diagnosis not present

## 2022-07-12 DIAGNOSIS — E1122 Type 2 diabetes mellitus with diabetic chronic kidney disease: Secondary | ICD-10-CM | POA: Diagnosis not present

## 2022-07-12 DIAGNOSIS — M1030 Gout due to renal impairment, unspecified site: Secondary | ICD-10-CM | POA: Diagnosis not present

## 2022-07-12 DIAGNOSIS — I480 Paroxysmal atrial fibrillation: Secondary | ICD-10-CM | POA: Diagnosis not present

## 2022-07-12 DIAGNOSIS — K5730 Diverticulosis of large intestine without perforation or abscess without bleeding: Secondary | ICD-10-CM | POA: Diagnosis not present

## 2022-07-12 DIAGNOSIS — F05 Delirium due to known physiological condition: Secondary | ICD-10-CM | POA: Diagnosis not present

## 2022-07-12 DIAGNOSIS — D500 Iron deficiency anemia secondary to blood loss (chronic): Secondary | ICD-10-CM | POA: Diagnosis not present

## 2022-07-12 DIAGNOSIS — G309 Alzheimer's disease, unspecified: Secondary | ICD-10-CM | POA: Diagnosis not present

## 2022-07-12 DIAGNOSIS — I5023 Acute on chronic systolic (congestive) heart failure: Secondary | ICD-10-CM | POA: Diagnosis not present

## 2022-07-12 DIAGNOSIS — I132 Hypertensive heart and chronic kidney disease with heart failure and with stage 5 chronic kidney disease, or end stage renal disease: Secondary | ICD-10-CM | POA: Diagnosis not present

## 2022-07-12 DIAGNOSIS — N186 End stage renal disease: Secondary | ICD-10-CM | POA: Diagnosis not present

## 2022-07-12 DIAGNOSIS — E1169 Type 2 diabetes mellitus with other specified complication: Secondary | ICD-10-CM | POA: Diagnosis not present

## 2022-07-12 DIAGNOSIS — G4730 Sleep apnea, unspecified: Secondary | ICD-10-CM | POA: Diagnosis not present

## 2022-07-12 DIAGNOSIS — I2510 Atherosclerotic heart disease of native coronary artery without angina pectoris: Secondary | ICD-10-CM | POA: Diagnosis not present

## 2022-07-13 DIAGNOSIS — E876 Hypokalemia: Secondary | ICD-10-CM | POA: Diagnosis not present

## 2022-07-13 DIAGNOSIS — Z992 Dependence on renal dialysis: Secondary | ICD-10-CM | POA: Diagnosis not present

## 2022-07-13 DIAGNOSIS — E1129 Type 2 diabetes mellitus with other diabetic kidney complication: Secondary | ICD-10-CM | POA: Diagnosis not present

## 2022-07-13 DIAGNOSIS — U071 COVID-19: Secondary | ICD-10-CM | POA: Diagnosis not present

## 2022-07-13 DIAGNOSIS — N186 End stage renal disease: Secondary | ICD-10-CM | POA: Diagnosis not present

## 2022-07-13 DIAGNOSIS — N2581 Secondary hyperparathyroidism of renal origin: Secondary | ICD-10-CM | POA: Diagnosis not present

## 2022-07-13 DIAGNOSIS — D631 Anemia in chronic kidney disease: Secondary | ICD-10-CM | POA: Diagnosis not present

## 2022-07-16 DIAGNOSIS — E876 Hypokalemia: Secondary | ICD-10-CM | POA: Diagnosis not present

## 2022-07-16 DIAGNOSIS — D631 Anemia in chronic kidney disease: Secondary | ICD-10-CM | POA: Diagnosis not present

## 2022-07-16 DIAGNOSIS — N186 End stage renal disease: Secondary | ICD-10-CM | POA: Diagnosis not present

## 2022-07-16 DIAGNOSIS — E1129 Type 2 diabetes mellitus with other diabetic kidney complication: Secondary | ICD-10-CM | POA: Diagnosis not present

## 2022-07-16 DIAGNOSIS — Z992 Dependence on renal dialysis: Secondary | ICD-10-CM | POA: Diagnosis not present

## 2022-07-16 DIAGNOSIS — N2581 Secondary hyperparathyroidism of renal origin: Secondary | ICD-10-CM | POA: Diagnosis not present

## 2022-07-17 DIAGNOSIS — U071 COVID-19: Secondary | ICD-10-CM | POA: Diagnosis not present

## 2022-07-17 DIAGNOSIS — I2510 Atherosclerotic heart disease of native coronary artery without angina pectoris: Secondary | ICD-10-CM | POA: Diagnosis not present

## 2022-07-17 DIAGNOSIS — I5032 Chronic diastolic (congestive) heart failure: Secondary | ICD-10-CM | POA: Diagnosis not present

## 2022-07-17 DIAGNOSIS — I480 Paroxysmal atrial fibrillation: Secondary | ICD-10-CM | POA: Diagnosis not present

## 2022-07-17 DIAGNOSIS — I132 Hypertensive heart and chronic kidney disease with heart failure and with stage 5 chronic kidney disease, or end stage renal disease: Secondary | ICD-10-CM | POA: Diagnosis not present

## 2022-07-17 DIAGNOSIS — I5023 Acute on chronic systolic (congestive) heart failure: Secondary | ICD-10-CM | POA: Diagnosis not present

## 2022-07-18 DIAGNOSIS — E876 Hypokalemia: Secondary | ICD-10-CM | POA: Diagnosis not present

## 2022-07-18 DIAGNOSIS — N186 End stage renal disease: Secondary | ICD-10-CM | POA: Diagnosis not present

## 2022-07-18 DIAGNOSIS — Z992 Dependence on renal dialysis: Secondary | ICD-10-CM | POA: Diagnosis not present

## 2022-07-18 DIAGNOSIS — E1129 Type 2 diabetes mellitus with other diabetic kidney complication: Secondary | ICD-10-CM | POA: Diagnosis not present

## 2022-07-18 DIAGNOSIS — D631 Anemia in chronic kidney disease: Secondary | ICD-10-CM | POA: Diagnosis not present

## 2022-07-18 DIAGNOSIS — N2581 Secondary hyperparathyroidism of renal origin: Secondary | ICD-10-CM | POA: Diagnosis not present

## 2022-07-19 DIAGNOSIS — I480 Paroxysmal atrial fibrillation: Secondary | ICD-10-CM | POA: Diagnosis not present

## 2022-07-19 DIAGNOSIS — I2510 Atherosclerotic heart disease of native coronary artery without angina pectoris: Secondary | ICD-10-CM | POA: Diagnosis not present

## 2022-07-19 DIAGNOSIS — I5032 Chronic diastolic (congestive) heart failure: Secondary | ICD-10-CM | POA: Diagnosis not present

## 2022-07-19 DIAGNOSIS — I5023 Acute on chronic systolic (congestive) heart failure: Secondary | ICD-10-CM | POA: Diagnosis not present

## 2022-07-19 DIAGNOSIS — U071 COVID-19: Secondary | ICD-10-CM | POA: Diagnosis not present

## 2022-07-19 DIAGNOSIS — I132 Hypertensive heart and chronic kidney disease with heart failure and with stage 5 chronic kidney disease, or end stage renal disease: Secondary | ICD-10-CM | POA: Diagnosis not present

## 2022-07-20 DIAGNOSIS — N186 End stage renal disease: Secondary | ICD-10-CM | POA: Diagnosis not present

## 2022-07-20 DIAGNOSIS — E876 Hypokalemia: Secondary | ICD-10-CM | POA: Diagnosis not present

## 2022-07-20 DIAGNOSIS — Z992 Dependence on renal dialysis: Secondary | ICD-10-CM | POA: Diagnosis not present

## 2022-07-20 DIAGNOSIS — E1129 Type 2 diabetes mellitus with other diabetic kidney complication: Secondary | ICD-10-CM | POA: Diagnosis not present

## 2022-07-20 DIAGNOSIS — N2581 Secondary hyperparathyroidism of renal origin: Secondary | ICD-10-CM | POA: Diagnosis not present

## 2022-07-20 DIAGNOSIS — D631 Anemia in chronic kidney disease: Secondary | ICD-10-CM | POA: Diagnosis not present

## 2022-07-23 DIAGNOSIS — N2581 Secondary hyperparathyroidism of renal origin: Secondary | ICD-10-CM | POA: Diagnosis not present

## 2022-07-23 DIAGNOSIS — E1129 Type 2 diabetes mellitus with other diabetic kidney complication: Secondary | ICD-10-CM | POA: Diagnosis not present

## 2022-07-23 DIAGNOSIS — Z992 Dependence on renal dialysis: Secondary | ICD-10-CM | POA: Diagnosis not present

## 2022-07-23 DIAGNOSIS — D631 Anemia in chronic kidney disease: Secondary | ICD-10-CM | POA: Diagnosis not present

## 2022-07-23 DIAGNOSIS — E876 Hypokalemia: Secondary | ICD-10-CM | POA: Diagnosis not present

## 2022-07-23 DIAGNOSIS — N186 End stage renal disease: Secondary | ICD-10-CM | POA: Diagnosis not present

## 2022-07-24 ENCOUNTER — Encounter: Payer: Self-pay | Admitting: Cardiology

## 2022-07-24 ENCOUNTER — Ambulatory Visit: Payer: Medicare Other | Attending: Cardiology | Admitting: Cardiology

## 2022-07-24 VITALS — BP 112/56 | HR 68 | Ht 68.0 in | Wt 141.6 lb

## 2022-07-24 DIAGNOSIS — I5022 Chronic systolic (congestive) heart failure: Secondary | ICD-10-CM | POA: Insufficient documentation

## 2022-07-24 DIAGNOSIS — I2510 Atherosclerotic heart disease of native coronary artery without angina pectoris: Secondary | ICD-10-CM | POA: Diagnosis not present

## 2022-07-24 DIAGNOSIS — U071 COVID-19: Secondary | ICD-10-CM | POA: Diagnosis not present

## 2022-07-24 DIAGNOSIS — I132 Hypertensive heart and chronic kidney disease with heart failure and with stage 5 chronic kidney disease, or end stage renal disease: Secondary | ICD-10-CM | POA: Diagnosis not present

## 2022-07-24 DIAGNOSIS — I480 Paroxysmal atrial fibrillation: Secondary | ICD-10-CM | POA: Diagnosis not present

## 2022-07-24 DIAGNOSIS — I5032 Chronic diastolic (congestive) heart failure: Secondary | ICD-10-CM | POA: Diagnosis not present

## 2022-07-24 DIAGNOSIS — I5023 Acute on chronic systolic (congestive) heart failure: Secondary | ICD-10-CM | POA: Diagnosis not present

## 2022-07-24 NOTE — Progress Notes (Signed)
Cardiology Office Note   Date:  07/24/2022   ID:  [name], DOB 1941-06-11, MRN [id number]  PCP:  Elby Showers, MD  Cardiologist:   Johnny Breeding, MD   Chief Complaint  Patient presents with   Atrial Fibrillation      History of Present Illness: [name] is a 81 y.o. male who presents for evaluation of coronary disease.  He also has a history of atrial fibrillation but is not on anticoagulation because of AVMs and GI bleeding.  He has heart failure with a moderately reduced ejection fraction of 45 to 50%.  Of note when he was in the hospital with COVID he had some elevated troponins.  We thought this was probably demand ischemia in the setting of atrial fibrillation and his acute illness.  His atrial fibrillation was managed with amiodarone for rate control but eventually this was discontinued because of prolonged QT.  He has had no AV nodal blocking agents because of low blood pressures requiring midodrine.  He did have his prolonged QT but it improved.  He seems to be euvolemic on discharge.  He has not been to see [place] in the office since November of last year.  He was in the hospital in September with [place].  Of note I did review Dr. Verlene Mayer last note.  Given his multiple comorbidities she has talked to him about hospice.  His wife is not ready for this and he will be looking into palliative care services.  .    He has significant dementia.  He is here with his wife for 58 years.  He really cannot answer many of the questions but there does not seem to be any palpitations or tach arrhythmias.  He has not had any new shortness of breath, PND or orthopnea.  He has had no new palpitations, presyncope or syncope.  He has had no weight gain or edema.   Past Medical History:  Diagnosis Date   Allergy    Alzheimer's disease ([facility]) 01/19/2021   Anemia    Arthritis    Cataract    bil cateracts removed   Coronary artery disease    Diabetes mellitus    Type 2    Diverticulitis    ED (erectile dysfunction)    Elevated homocysteine    ESRD (end stage renal disease) on dialysis ([place]) 03/2015   M-W-F dialysis   GERD (gastroesophageal reflux disease)    pepto    Gout    Hiatal hernia    Hyperlipidemia    Hypertension    Hypothyroidism    PVD (peripheral vascular disease) ([place])    has plastic aorta   Renal insufficiency    Sleep apnea    does not wear c-pap    Past Surgical History:  Procedure Laterality Date   ANGIOPLASTY Right 01/26/2021   Procedure: ANGIOPLASTY RIGHT SUBCLAVIAN VEIN;  Surgeon: Serafina Mitchell, MD;  Location: [place];  Service: Vascular;  Laterality: Right;   aortobifemoral bypass     AV FISTULA PLACEMENT Left 12/01/2013   Procedure: ARTERIOVENOUS (AV) FISTULA CREATION- LEFT BRACHIOCEPHALIC;  Surgeon: Angelia Mould, MD;  Location: [place];  Service: Vascular;  Laterality: Left;   BASCILIC VEIN TRANSPOSITION Right 07/27/2014   Procedure: BASCILIC VEIN TRANSPOSITION;  Surgeon: Angelia Mould, MD;  Location: [place];  Service: Vascular;  Laterality: Right;   BRAIN SURGERY  07/22/2018   BREAST SURGERY     left - granulomatous mastitis   BURR HOLE  Bilateral 07/22/2018   Procedure: BILATERAL BURR HOLES;  Surgeon: Kristeen Miss, MD;  Location: [place];  Service: Neurosurgery;  Laterality: Bilateral;   COLONOSCOPY     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 ([place] [facility])  1992   ENTEROSCOPY N/A 06/03/2018   Procedure: ENTEROSCOPY;  Surgeon: Lavena Bullion, DO;  Location: [facility] ENDOSCOPY;  Service: Gastroenterology;  Laterality: N/A;   ENTEROSCOPY N/A 11/20/2018   Procedure: ENTEROSCOPY;  Surgeon: Rush Landmark Telford Nab., MD;  Location: [place];  Service: Gastroenterology;  Laterality: N/A;   ENTEROSCOPY N/A 12/03/2019   Procedure: ENTEROSCOPY;  Surgeon: Lavena Bullion, DO;  Location: [facility] ENDOSCOPY;  Service: Gastroenterology;  Laterality: N/A;  push enteroscopy   ENTEROSCOPY N/A 07/04/2021   Procedure: ENTEROSCOPY;  Surgeon:  Sharyn Creamer, MD;  Location: [facility] ENDOSCOPY;  Service: Gastroenterology;  Laterality: N/A;   ENTEROSCOPY N/A 11/05/2021   Procedure: ENTEROSCOPY;  Surgeon: Daryel November, MD;  Location: [facility] ENDOSCOPY;  Service: Gastroenterology;  Laterality: N/A;   EYE SURGERY Bilateral    cataracts   FISTULOGRAM Right 01/26/2021   Procedure: FISTULOGRAM RIGHT;  Surgeon: Serafina Mitchell, MD;  Location: [facility] OR;  Service: Vascular;  Laterality: Right;   HEMODIALYSIS INPATIENT  01/17/2018       HOT HEMOSTASIS N/A 06/03/2018   Procedure: HOT HEMOSTASIS (ARGON PLASMA COAGULATION/BICAP);  Surgeon: Lavena Bullion, DO;  Location: [facility] ENDOSCOPY;  Service: Gastroenterology;  Laterality: N/A;   HOT HEMOSTASIS N/A 11/20/2018   Procedure: HOT HEMOSTASIS (ARGON PLASMA COAGULATION/BICAP);  Surgeon: Irving Copas., MD;  Location: [place];  Service: Gastroenterology;  Laterality: N/A;   HOT HEMOSTASIS N/A 12/03/2019   Procedure: HOT HEMOSTASIS (ARGON PLASMA COAGULATION/BICAP);  Surgeon: Lavena Bullion, DO;  Location: [facility] ENDOSCOPY;  Service: Gastroenterology;  Laterality: N/A;   INTRAMEDULLARY (IM) NAIL INTERTROCHANTERIC Right 11/08/2020   Procedure: INTRAMEDULLARY (IM) NAIL INTERTROCHANTRIC;  Surgeon: Erle Crocker, MD;  Location: [place];  Service: Orthopedics;  Laterality: Right;   REVISION OF ARTERIOVENOUS GORETEX GRAFT Right 09/01/2019   Procedure: REVISION OF ARTERIOVENOUS FISTULA RIGHT ARM;  Surgeon: Serafina Mitchell, MD;  Location: [place];  Service: Vascular;  Laterality: Right;   REVISON OF ARTERIOVENOUS FISTULA Left 02/09/2014   Procedure: REVISON OF LEFT ARTERIOVENOUS FISTULA - RESECTION OF RENDUNDANT VEIN;  Surgeon: Angelia Mould, MD;  Location: [place];  Service: Vascular;  Laterality: Left;   REVISON OF ARTERIOVENOUS FISTULA Right 01/26/2021   Procedure: REVISON OF ARTERIOVENOUS FISTULA RIGHT;  Surgeon: Serafina Mitchell, MD;  Location: [place];  Service: Vascular;  Laterality: Right;   SBO  with lysis adhesions     SHUNTOGRAM Left 04/19/2014   Procedure: FISTULOGRAM;  Surgeon: Angelia Mould, MD;  Location: [facility] CATH LAB;  Service: Cardiovascular;  Laterality: Left;   UNILATERAL UPPER EXTREMEITY ANGIOGRAM N/A 07/12/2014   Procedure: UNILATERAL UPPER Anselmo Rod;  Surgeon: Angelia Mould, MD;  Location: [facility] CATH LAB;  Service: Cardiovascular;  Laterality: N/A;     Current Outpatient Medications  Medication Sig Dispense Refill   acetaminophen (TYLENOL) 325 MG tablet Take 1-2 tablets (325-650 mg total) by mouth every 4 (four) hours as needed for mild pain. (Patient taking differently: Take 650 mg by mouth in the morning and at bedtime.)     albuterol (VENTOLIN HFA) 108 (90 Base) MCG/ACT inhaler Inhale 1-2 puffs into the lungs daily as needed for wheezing or shortness of breath.     allopurinol (ZYLOPRIM) 100 MG tablet Take 100 mg by mouth daily.  Blood Glucose Monitoring Suppl (FREESTYLE FREEDOM LITE) w/Device KIT Use to check blood sugar 2 times per day dx code E11.65 1 each 0   camphor-menthol (SARNA) lotion Apply 1 application topically 4 (four) times daily as needed for itching.      Carboxymethylcellulose Sodium (THERATEARS) 0.25 % SOLN Place 1 drop into both eyes 3 (three) times daily as needed (for dryness).     cetirizine (ZYRTEC) 10 MG tablet Take 10 mg by mouth daily as needed for allergies.     cyanocobalamin (,VITAMIN B-12,) 1000 MCG/ML injection Inject 1 mL (1,000 mcg total) into the muscle every 30 (thirty) days. 10 mL 0   donepezil (ARICEPT) 10 MG tablet Take 1 tablet (10 mg total) by mouth at bedtime. 90 tablet 0   doxercalciferol (HECTOROL) 4 MCG/2ML injection Inject 2.5 mLs (5 mcg total) into the vein every Monday, Wednesday, and Friday with hemodialysis. 2 mL 0   fluticasone (FLONASE) 50 MCG/ACT nasal spray Place 1 spray into both nostrils daily. (Patient taking differently: Place 2 sprays into both nostrils daily as needed for allergies.) 16 g  2   FORTEO 600 MCG/2.4ML SOPN INJECT 20 MCG UNDER THE SKIN DAILY (DISCARD 28 DAYS AFTER INITIAL USE) (Patient taking differently: Inject 20 mcg into the skin at bedtime. (DISCARD 28 DAYS AFTER INITIAL USE)) 2.4 mL 2   glucose blood (FREESTYLE LITE) test strip USE AS DIRECTED THREE TIMES DAILY 300 strip 2   hydrocortisone 2.5 % cream Apply 1 application topically 3 (three) times daily as needed (skin irritation (legs & arms)).      Lancets (FREESTYLE) lancets Use as instructed to check blood sugar 2 times per day dx code E11.65 100 each 3   levothyroxine (SYNTHROID, LEVOTHROID) 50 MCG tablet Take 1 tablet (50 mcg total) by mouth daily. (Patient taking differently: Take 50 mcg by mouth daily before breakfast.) 90 tablet 1   memantine (NAMENDA) 10 MG tablet Take 1 tablet (10 mg total) by mouth 2 (two) times daily. 180 tablet 0   midodrine (PROAMATINE) 10 MG tablet Take one pill prior to hemodialysis on MWF (Patient taking differently: Take 10 mg by mouth See admin instructions. Take 10 mg by mouth prior to hemodialysis on Mon/Wed/Fri)     Nutritional Supplements (FEEDING SUPPLEMENT, NEPRO CARB STEADY,) LIQD Take 237 mLs by mouth in the morning.     omeprazole (PRILOSEC) 40 MG capsule Take 1 capsule (40 mg total) by mouth daily. (Patient taking differently: Take 40 mg by mouth daily before breakfast.) 30 capsule 0   QUEtiapine (SEROQUEL) 25 MG tablet Take 1 tablet (25 mg total) by mouth at bedtime. 90 tablet 0   repaglinide (PRANDIN) 0.5 MG tablet TAKE 1 TABLET(0.5 MG) BY MOUTH TWICE DAILY BEFORE A MEAL (Patient taking differently: Take 0.5 mg by mouth See admin instructions. Take 0.5 mg by mouth at 5 pm (AFTER dialysis) only on Mon/Wed/Fri) 60 tablet 2   rosuvastatin (CRESTOR) 10 MG tablet Take 1 tablet (10 mg total) by mouth daily. 30 tablet 1   sevelamer carbonate (RENVELA) 800 MG tablet Take 1,600-2,400 mg by mouth See admin instructions. Take 2,400 mg by mouth three times a day with meals and 1,600 mg  with snacks     Syringe/Needle, Disp, (SYRINGE 3CC/25GX1") 25G X 1" 3 ML MISC 1 each by Does not apply route every 30 (thirty) days. 50 each 0   TRADJENTA 5 MG TABS tablet Take 1 tablet (5 mg total) by mouth daily. 90 tablet 3   No  current facility-administered medications for this visit.    Allergies:   Ambien [zolpidem tartrate] and Penicillins     ROS:  Please see the history of present illness.   Otherwise, review of systems are positive for none.   All other systems are reviewed and negative.    PHYSICAL EXAM: VS:  BP (!) 112/56 (BP Location: Left Arm, Patient Position: Sitting, Cuff Size: Normal)   Pulse 68   Ht _0  (1.727 m)   Wt 141 lb 9.6 oz (64.2 kg)   SpO2 99%   BMI 21.53 kg/m  , BMI Body mass index is 21.53 kg/m. GENERAL:  Well appearing HEENT:  Pupils equal round and reactive, fundi not visualized, oral mucosa unremarkable NECK:  No jugular venous distention, waveform within normal limits, carotid upstroke brisk and symmetric, no bruits, no thyromegaly LYMPHATICS:  No cervical, inguinal adenopathy LUNGS:  Clear to auscultation bilaterally BACK:  No CVA tenderness CHEST:  Unremarkable HEART:  PMI not displaced or sustained,S1 and S2 within normal limits, no S3, no S4, no clicks, no rubs, no murmurs ABD:  Flat, positive bowel sounds normal in frequency in pitch, no bruits, no rebound, no guarding, no midline pulsatile mass, no hepatomegaly, no splenomegaly EXT:  2 plus pulses throughout, no edema, no cyanosis no clubbing SKIN:  No rashes no nodules NEURO:  Cranial nerves II through XII grossly intact, motor grossly intact throughout PSYCH:  Cognitively intact, oriented to person place and time    EKG:  EKG is ordered today. The ekg ordered today regular rhythm, rate 67, possible ectopic atrial rhythm, left axis deviation, poor anterior R wave progression, possible old anteroseptal infarct.  QT is improved compared to previous   Recent Labs: 06/08/2022: TSH  1.800 06/17/2022: Magnesium 2.1 06/21/2022: ALT 14; B Natriuretic Peptide 1,674.6 06/22/2022: BUN 50; Creatinine, Ser 8.92; Hemoglobin 8.7; Platelets 162; Potassium 4.7; Sodium 140    Lipid Panel    Component Value Date/Time   CHOL 96 05/17/2022 1144   TRIG 132 05/17/2022 1144   HDL 44 05/17/2022 1144   CHOLHDL 2.2 05/17/2022 1144   VLDL 17.0 05/09/2021 1147   LDLCALC 30 05/17/2022 1144      Wt Readings from Last 3 Encounters:  07/24/22 141 lb 9.6 oz (64.2 kg)  06/24/22 147 lb 11.3 oz (67 kg)  06/17/22 153 lb 10.6 oz (69.7 kg)      Other studies Reviewed: Additional studies/ records that were reviewed today include: Extensive review of hospital records. Review of the above records demonstrates:  Please see elsewhere in the note.     ASSESSMENT AND PLAN:  Acute on chronic systolic heart failure with a moderately reduced ejection fraction: I am going to repeat an echocardiogram in 1 year.  He will not tolerate med titration because of hypotension.  His volume is managed per dialysis.  No other change in therapy.  Hypertension: Blood pressure as above.  Atrial fibrillation: Has had no symptomatic paroxysms.  I briefly talked to his wife about [place].  However, given his extensive comorbidities and dementia she agrees that invasive procedures not indicated.  Soft  Current medicines are reviewed at length with the patient today.  The patient does not have concerns regarding medicines.  The following changes have been made:  no change Labs/ tests ordered today include:   Orders Placed This Encounter  Procedures   EKG 12-Lead   ECHOCARDIOGRAM COMPLETE     Disposition:   FU with me in two years.  Signed, Johnny Breeding, MD  07/24/2022 4:22 PM    [place]

## 2022-07-24 NOTE — Patient Instructions (Addendum)
Medication Instructions:  No changes *If you need a refill on your cardiac medications before your next appointment, please call your pharmacy*   Lab Work: None ordered If you have labs (blood work) drawn today and your tests are completely normal, you will receive your results only by: [place] (if you have MyChart) OR A paper copy in the mail If you have any lab test that is abnormal or we need to change your treatment, we will call you to review the results.   Testing/Procedures: Your physician has requested that you have an echocardiogram in 12 months. Echocardiography is a painless test that uses sound waves to create images of your heart. It provides your doctor with information about the size and shape of your heart and how well your heart's chambers and valves are working. You may receive an ultrasound enhancing agent through an IV if needed to better visualize your heart during the echo.This procedure takes approximately one hour. There are no restrictions for this procedure. This will take place at the [address].     Follow-Up: At [facility], you and your health needs are our priority.  As part of our continuing mission to provide you with exceptional heart care, we have created designated Provider Care Teams.  These Care Teams include your primary Cardiologist (physician) and Advanced Practice Providers (APPs -  Physician Assistants and Nurse Practitioners) who all work together to provide you with the care you need, when you need it.  We recommend signing up for the patient portal called "MyChart".  Sign up information is provided on this After Visit Summary.  MyChart is used to connect with patients for Virtual Visits (Telemedicine).  Patients are able to view lab/test results, encounter notes, upcoming appointments, etc.  Non-urgent messages can be sent to your provider as well.   To learn more about what you can do with MyChart, go to  [email or web address].    Your next appointment:   1 year(s)  The format for your next appointment:   In Person  Provider:   Minus Breeding, MD      Important Information About Sugar

## 2022-07-25 DIAGNOSIS — I480 Paroxysmal atrial fibrillation: Secondary | ICD-10-CM | POA: Diagnosis not present

## 2022-07-25 DIAGNOSIS — E1129 Type 2 diabetes mellitus with other diabetic kidney complication: Secondary | ICD-10-CM | POA: Diagnosis not present

## 2022-07-25 DIAGNOSIS — I5023 Acute on chronic systolic (congestive) heart failure: Secondary | ICD-10-CM | POA: Diagnosis not present

## 2022-07-25 DIAGNOSIS — E876 Hypokalemia: Secondary | ICD-10-CM | POA: Diagnosis not present

## 2022-07-25 DIAGNOSIS — N186 End stage renal disease: Secondary | ICD-10-CM | POA: Diagnosis not present

## 2022-07-25 DIAGNOSIS — I2510 Atherosclerotic heart disease of native coronary artery without angina pectoris: Secondary | ICD-10-CM | POA: Diagnosis not present

## 2022-07-25 DIAGNOSIS — D631 Anemia in chronic kidney disease: Secondary | ICD-10-CM | POA: Diagnosis not present

## 2022-07-25 DIAGNOSIS — I132 Hypertensive heart and chronic kidney disease with heart failure and with stage 5 chronic kidney disease, or end stage renal disease: Secondary | ICD-10-CM | POA: Diagnosis not present

## 2022-07-25 DIAGNOSIS — E1122 Type 2 diabetes mellitus with diabetic chronic kidney disease: Secondary | ICD-10-CM | POA: Diagnosis not present

## 2022-07-25 DIAGNOSIS — I5032 Chronic diastolic (congestive) heart failure: Secondary | ICD-10-CM | POA: Diagnosis not present

## 2022-07-25 DIAGNOSIS — U071 COVID-19: Secondary | ICD-10-CM | POA: Diagnosis not present

## 2022-07-25 DIAGNOSIS — Z992 Dependence on renal dialysis: Secondary | ICD-10-CM | POA: Diagnosis not present

## 2022-07-25 DIAGNOSIS — N2581 Secondary hyperparathyroidism of renal origin: Secondary | ICD-10-CM | POA: Diagnosis not present

## 2022-07-26 DIAGNOSIS — I132 Hypertensive heart and chronic kidney disease with heart failure and with stage 5 chronic kidney disease, or end stage renal disease: Secondary | ICD-10-CM | POA: Diagnosis not present

## 2022-07-26 DIAGNOSIS — I5023 Acute on chronic systolic (congestive) heart failure: Secondary | ICD-10-CM | POA: Diagnosis not present

## 2022-07-26 DIAGNOSIS — I2510 Atherosclerotic heart disease of native coronary artery without angina pectoris: Secondary | ICD-10-CM | POA: Diagnosis not present

## 2022-07-26 DIAGNOSIS — U071 COVID-19: Secondary | ICD-10-CM | POA: Diagnosis not present

## 2022-07-26 DIAGNOSIS — I480 Paroxysmal atrial fibrillation: Secondary | ICD-10-CM | POA: Diagnosis not present

## 2022-07-26 DIAGNOSIS — I5032 Chronic diastolic (congestive) heart failure: Secondary | ICD-10-CM | POA: Diagnosis not present

## 2022-07-27 DIAGNOSIS — N2581 Secondary hyperparathyroidism of renal origin: Secondary | ICD-10-CM | POA: Diagnosis not present

## 2022-07-27 DIAGNOSIS — E1129 Type 2 diabetes mellitus with other diabetic kidney complication: Secondary | ICD-10-CM | POA: Diagnosis not present

## 2022-07-27 DIAGNOSIS — Z992 Dependence on renal dialysis: Secondary | ICD-10-CM | POA: Diagnosis not present

## 2022-07-27 DIAGNOSIS — E876 Hypokalemia: Secondary | ICD-10-CM | POA: Diagnosis not present

## 2022-07-27 DIAGNOSIS — D631 Anemia in chronic kidney disease: Secondary | ICD-10-CM | POA: Diagnosis not present

## 2022-07-27 DIAGNOSIS — N186 End stage renal disease: Secondary | ICD-10-CM | POA: Diagnosis not present

## 2022-07-30 DIAGNOSIS — E1129 Type 2 diabetes mellitus with other diabetic kidney complication: Secondary | ICD-10-CM | POA: Diagnosis not present

## 2022-07-30 DIAGNOSIS — E876 Hypokalemia: Secondary | ICD-10-CM | POA: Diagnosis not present

## 2022-07-30 DIAGNOSIS — N2581 Secondary hyperparathyroidism of renal origin: Secondary | ICD-10-CM | POA: Diagnosis not present

## 2022-07-30 DIAGNOSIS — D631 Anemia in chronic kidney disease: Secondary | ICD-10-CM | POA: Diagnosis not present

## 2022-07-30 DIAGNOSIS — N186 End stage renal disease: Secondary | ICD-10-CM | POA: Diagnosis not present

## 2022-07-30 DIAGNOSIS — Z992 Dependence on renal dialysis: Secondary | ICD-10-CM | POA: Diagnosis not present

## 2022-08-01 DIAGNOSIS — N2581 Secondary hyperparathyroidism of renal origin: Secondary | ICD-10-CM | POA: Diagnosis not present

## 2022-08-01 DIAGNOSIS — E1129 Type 2 diabetes mellitus with other diabetic kidney complication: Secondary | ICD-10-CM | POA: Diagnosis not present

## 2022-08-01 DIAGNOSIS — N186 End stage renal disease: Secondary | ICD-10-CM | POA: Diagnosis not present

## 2022-08-01 DIAGNOSIS — D631 Anemia in chronic kidney disease: Secondary | ICD-10-CM | POA: Diagnosis not present

## 2022-08-01 DIAGNOSIS — E876 Hypokalemia: Secondary | ICD-10-CM | POA: Diagnosis not present

## 2022-08-01 DIAGNOSIS — Z992 Dependence on renal dialysis: Secondary | ICD-10-CM | POA: Diagnosis not present

## 2022-08-02 DIAGNOSIS — I5032 Chronic diastolic (congestive) heart failure: Secondary | ICD-10-CM | POA: Diagnosis not present

## 2022-08-02 DIAGNOSIS — I132 Hypertensive heart and chronic kidney disease with heart failure and with stage 5 chronic kidney disease, or end stage renal disease: Secondary | ICD-10-CM | POA: Diagnosis not present

## 2022-08-02 DIAGNOSIS — I2510 Atherosclerotic heart disease of native coronary artery without angina pectoris: Secondary | ICD-10-CM | POA: Diagnosis not present

## 2022-08-02 DIAGNOSIS — U071 COVID-19: Secondary | ICD-10-CM | POA: Diagnosis not present

## 2022-08-02 DIAGNOSIS — I5023 Acute on chronic systolic (congestive) heart failure: Secondary | ICD-10-CM | POA: Diagnosis not present

## 2022-08-02 DIAGNOSIS — I480 Paroxysmal atrial fibrillation: Secondary | ICD-10-CM | POA: Diagnosis not present

## 2022-08-03 DIAGNOSIS — E876 Hypokalemia: Secondary | ICD-10-CM | POA: Diagnosis not present

## 2022-08-03 DIAGNOSIS — D631 Anemia in chronic kidney disease: Secondary | ICD-10-CM | POA: Diagnosis not present

## 2022-08-03 DIAGNOSIS — E1129 Type 2 diabetes mellitus with other diabetic kidney complication: Secondary | ICD-10-CM | POA: Diagnosis not present

## 2022-08-03 DIAGNOSIS — N186 End stage renal disease: Secondary | ICD-10-CM | POA: Diagnosis not present

## 2022-08-03 DIAGNOSIS — Z992 Dependence on renal dialysis: Secondary | ICD-10-CM | POA: Diagnosis not present

## 2022-08-03 DIAGNOSIS — N2581 Secondary hyperparathyroidism of renal origin: Secondary | ICD-10-CM | POA: Diagnosis not present

## 2022-08-06 ENCOUNTER — Encounter: Payer: Self-pay | Admitting: Internal Medicine

## 2022-08-06 ENCOUNTER — Telehealth: Payer: Self-pay | Admitting: Internal Medicine

## 2022-08-06 DIAGNOSIS — D631 Anemia in chronic kidney disease: Secondary | ICD-10-CM | POA: Diagnosis not present

## 2022-08-06 DIAGNOSIS — Z992 Dependence on renal dialysis: Secondary | ICD-10-CM | POA: Diagnosis not present

## 2022-08-06 DIAGNOSIS — E1129 Type 2 diabetes mellitus with other diabetic kidney complication: Secondary | ICD-10-CM | POA: Diagnosis not present

## 2022-08-06 DIAGNOSIS — N186 End stage renal disease: Secondary | ICD-10-CM | POA: Diagnosis not present

## 2022-08-06 DIAGNOSIS — N2581 Secondary hyperparathyroidism of renal origin: Secondary | ICD-10-CM | POA: Diagnosis not present

## 2022-08-06 DIAGNOSIS — E876 Hypokalemia: Secondary | ICD-10-CM | POA: Diagnosis not present

## 2022-08-06 MED ORDER — QUETIAPINE FUMARATE 25 MG PO TABS: 25.0000 mg | ORAL_TABLET | Freq: Every day | ORAL | 0 refills | Status: AC

## 2022-08-06 NOTE — Telephone Encounter (Signed)
Philipp Callegari [phone]  [name] called to see if Rush Landmark could get refills on below medication, she said it really seem to help him.  QUEtiapine (SEROQUEL) 25 MG tablet   [facility] [phone] - [place], [address] AT [address] Phone: [phone]  Fax: [phone]

## 2022-08-06 NOTE — Telephone Encounter (Signed)
Seroquel refilled. MJB, MD

## 2022-08-08 DIAGNOSIS — E1129 Type 2 diabetes mellitus with other diabetic kidney complication: Secondary | ICD-10-CM | POA: Diagnosis not present

## 2022-08-08 DIAGNOSIS — D631 Anemia in chronic kidney disease: Secondary | ICD-10-CM | POA: Diagnosis not present

## 2022-08-08 DIAGNOSIS — E876 Hypokalemia: Secondary | ICD-10-CM | POA: Diagnosis not present

## 2022-08-08 DIAGNOSIS — N2581 Secondary hyperparathyroidism of renal origin: Secondary | ICD-10-CM | POA: Diagnosis not present

## 2022-08-08 DIAGNOSIS — Z992 Dependence on renal dialysis: Secondary | ICD-10-CM | POA: Diagnosis not present

## 2022-08-08 DIAGNOSIS — N186 End stage renal disease: Secondary | ICD-10-CM | POA: Diagnosis not present

## 2022-08-10 DIAGNOSIS — Z992 Dependence on renal dialysis: Secondary | ICD-10-CM | POA: Diagnosis not present

## 2022-08-10 DIAGNOSIS — E1129 Type 2 diabetes mellitus with other diabetic kidney complication: Secondary | ICD-10-CM | POA: Diagnosis not present

## 2022-08-10 DIAGNOSIS — D631 Anemia in chronic kidney disease: Secondary | ICD-10-CM | POA: Diagnosis not present

## 2022-08-10 DIAGNOSIS — N2581 Secondary hyperparathyroidism of renal origin: Secondary | ICD-10-CM | POA: Diagnosis not present

## 2022-08-10 DIAGNOSIS — E876 Hypokalemia: Secondary | ICD-10-CM | POA: Diagnosis not present

## 2022-08-10 DIAGNOSIS — N186 End stage renal disease: Secondary | ICD-10-CM | POA: Diagnosis not present

## 2022-08-12 DIAGNOSIS — N2581 Secondary hyperparathyroidism of renal origin: Secondary | ICD-10-CM | POA: Diagnosis not present

## 2022-08-12 DIAGNOSIS — E876 Hypokalemia: Secondary | ICD-10-CM | POA: Diagnosis not present

## 2022-08-12 DIAGNOSIS — E1129 Type 2 diabetes mellitus with other diabetic kidney complication: Secondary | ICD-10-CM | POA: Diagnosis not present

## 2022-08-12 DIAGNOSIS — N186 End stage renal disease: Secondary | ICD-10-CM | POA: Diagnosis not present

## 2022-08-12 DIAGNOSIS — Z992 Dependence on renal dialysis: Secondary | ICD-10-CM | POA: Diagnosis not present

## 2022-08-12 DIAGNOSIS — D631 Anemia in chronic kidney disease: Secondary | ICD-10-CM | POA: Diagnosis not present

## 2022-08-14 DIAGNOSIS — E876 Hypokalemia: Secondary | ICD-10-CM | POA: Diagnosis not present

## 2022-08-14 DIAGNOSIS — N2581 Secondary hyperparathyroidism of renal origin: Secondary | ICD-10-CM | POA: Diagnosis not present

## 2022-08-14 DIAGNOSIS — N186 End stage renal disease: Secondary | ICD-10-CM | POA: Diagnosis not present

## 2022-08-14 DIAGNOSIS — E1129 Type 2 diabetes mellitus with other diabetic kidney complication: Secondary | ICD-10-CM | POA: Diagnosis not present

## 2022-08-14 DIAGNOSIS — Z992 Dependence on renal dialysis: Secondary | ICD-10-CM | POA: Diagnosis not present

## 2022-08-14 DIAGNOSIS — D631 Anemia in chronic kidney disease: Secondary | ICD-10-CM | POA: Diagnosis not present

## 2022-08-17 ENCOUNTER — Observation Stay (HOSPITAL_COMMUNITY)
Admission: EM | Admit: 2022-08-17 | Discharge: 2022-08-19 | Disposition: A | Discharge status: Home / Self Care | Payer: Medicare Other | Attending: Internal Medicine | Admitting: Internal Medicine

## 2022-08-17 ENCOUNTER — Emergency Department (HOSPITAL_COMMUNITY): Payer: Medicare Other

## 2022-08-17 ENCOUNTER — Encounter (HOSPITAL_COMMUNITY): Payer: Self-pay

## 2022-08-17 ENCOUNTER — Other Ambulatory Visit: Payer: Self-pay

## 2022-08-17 ENCOUNTER — Emergency Department (HOSPITAL_COMMUNITY): Payer: TRICARE For Life (TFL)

## 2022-08-17 DIAGNOSIS — I5042 Chronic combined systolic (congestive) and diastolic (congestive) heart failure: Secondary | ICD-10-CM | POA: Insufficient documentation

## 2022-08-17 DIAGNOSIS — E785 Hyperlipidemia, unspecified: Secondary | ICD-10-CM | POA: Diagnosis present

## 2022-08-17 DIAGNOSIS — Z7951 Long term (current) use of inhaled steroids: Secondary | ICD-10-CM | POA: Insufficient documentation

## 2022-08-17 DIAGNOSIS — E039 Hypothyroidism, unspecified: Secondary | ICD-10-CM | POA: Diagnosis not present

## 2022-08-17 DIAGNOSIS — G309 Alzheimer's disease, unspecified: Secondary | ICD-10-CM | POA: Insufficient documentation

## 2022-08-17 DIAGNOSIS — E1129 Type 2 diabetes mellitus with other diabetic kidney complication: Secondary | ICD-10-CM | POA: Diagnosis not present

## 2022-08-17 DIAGNOSIS — R748 Abnormal levels of other serum enzymes: Secondary | ICD-10-CM | POA: Insufficient documentation

## 2022-08-17 DIAGNOSIS — G4752 REM sleep behavior disorder: Secondary | ICD-10-CM | POA: Insufficient documentation

## 2022-08-17 DIAGNOSIS — R569 Unspecified convulsions: Principal | ICD-10-CM

## 2022-08-17 DIAGNOSIS — Z87891 Personal history of nicotine dependence: Secondary | ICD-10-CM | POA: Insufficient documentation

## 2022-08-17 DIAGNOSIS — F0280 Dementia in other diseases classified elsewhere without behavioral disturbance: Secondary | ICD-10-CM | POA: Insufficient documentation

## 2022-08-17 DIAGNOSIS — R9431 Abnormal electrocardiogram [ECG] [EKG]: Secondary | ICD-10-CM | POA: Diagnosis not present

## 2022-08-17 DIAGNOSIS — R55 Syncope and collapse: Secondary | ICD-10-CM | POA: Diagnosis not present

## 2022-08-17 DIAGNOSIS — R531 Weakness: Secondary | ICD-10-CM | POA: Diagnosis not present

## 2022-08-17 DIAGNOSIS — I6203 Nontraumatic chronic subdural hemorrhage: Secondary | ICD-10-CM | POA: Diagnosis not present

## 2022-08-17 DIAGNOSIS — I2510 Atherosclerotic heart disease of native coronary artery without angina pectoris: Secondary | ICD-10-CM | POA: Diagnosis not present

## 2022-08-17 DIAGNOSIS — D631 Anemia in chronic kidney disease: Secondary | ICD-10-CM | POA: Diagnosis not present

## 2022-08-17 DIAGNOSIS — G4733 Obstructive sleep apnea (adult) (pediatric): Secondary | ICD-10-CM | POA: Diagnosis present

## 2022-08-17 DIAGNOSIS — R251 Tremor, unspecified: Secondary | ICD-10-CM | POA: Diagnosis present

## 2022-08-17 DIAGNOSIS — E876 Hypokalemia: Secondary | ICD-10-CM | POA: Diagnosis not present

## 2022-08-17 DIAGNOSIS — G319 Degenerative disease of nervous system, unspecified: Secondary | ICD-10-CM | POA: Diagnosis not present

## 2022-08-17 DIAGNOSIS — Z79899 Other long term (current) drug therapy: Secondary | ICD-10-CM | POA: Insufficient documentation

## 2022-08-17 DIAGNOSIS — Z992 Dependence on renal dialysis: Secondary | ICD-10-CM | POA: Diagnosis not present

## 2022-08-17 DIAGNOSIS — I6782 Cerebral ischemia: Secondary | ICD-10-CM | POA: Diagnosis not present

## 2022-08-17 DIAGNOSIS — Z1152 Encounter for screening for COVID-19: Secondary | ICD-10-CM | POA: Diagnosis not present

## 2022-08-17 DIAGNOSIS — R609 Edema, unspecified: Secondary | ICD-10-CM | POA: Diagnosis not present

## 2022-08-17 DIAGNOSIS — I480 Paroxysmal atrial fibrillation: Secondary | ICD-10-CM | POA: Diagnosis not present

## 2022-08-17 DIAGNOSIS — I10 Essential (primary) hypertension: Secondary | ICD-10-CM | POA: Diagnosis present

## 2022-08-17 DIAGNOSIS — E1122 Type 2 diabetes mellitus with diabetic chronic kidney disease: Secondary | ICD-10-CM | POA: Insufficient documentation

## 2022-08-17 DIAGNOSIS — R109 Unspecified abdominal pain: Secondary | ICD-10-CM | POA: Insufficient documentation

## 2022-08-17 DIAGNOSIS — I132 Hypertensive heart and chronic kidney disease with heart failure and with stage 5 chronic kidney disease, or end stage renal disease: Secondary | ICD-10-CM | POA: Insufficient documentation

## 2022-08-17 DIAGNOSIS — R253 Fasciculation: Secondary | ICD-10-CM | POA: Diagnosis not present

## 2022-08-17 DIAGNOSIS — I5030 Unspecified diastolic (congestive) heart failure: Secondary | ICD-10-CM | POA: Insufficient documentation

## 2022-08-17 DIAGNOSIS — N186 End stage renal disease: Secondary | ICD-10-CM

## 2022-08-17 DIAGNOSIS — F0390 Unspecified dementia without behavioral disturbance: Secondary | ICD-10-CM | POA: Diagnosis present

## 2022-08-17 DIAGNOSIS — Z8616 Personal history of COVID-19: Secondary | ICD-10-CM | POA: Diagnosis not present

## 2022-08-17 DIAGNOSIS — E119 Type 2 diabetes mellitus without complications: Secondary | ICD-10-CM

## 2022-08-17 DIAGNOSIS — I959 Hypotension, unspecified: Secondary | ICD-10-CM | POA: Diagnosis not present

## 2022-08-17 DIAGNOSIS — S065X0A Traumatic subdural hemorrhage without loss of consciousness, initial encounter: Secondary | ICD-10-CM | POA: Diagnosis not present

## 2022-08-17 DIAGNOSIS — N2581 Secondary hyperparathyroidism of renal origin: Secondary | ICD-10-CM | POA: Diagnosis not present

## 2022-08-17 LAB — CBC WITH DIFFERENTIAL/PLATELET
Abs Immature Granulocytes: 0.03 10*3/uL (ref 0.00–0.07)
Basophils Absolute: 0.1 10*3/uL (ref 0.0–0.1)
Basophils Relative: 1 %
Eosinophils Absolute: 0.2 10*3/uL (ref 0.0–0.5)
Eosinophils Relative: 3 %
HCT: 35.9 % — ABNORMAL LOW (ref 39.0–52.0)
Hemoglobin: 12.3 g/dL — ABNORMAL LOW (ref 13.0–17.0)
Immature Granulocytes: 0 %
Lymphocytes Relative: 24 %
Lymphs Abs: 1.7 10*3/uL (ref 0.7–4.0)
MCH: 34.9 pg — ABNORMAL HIGH (ref 26.0–34.0)
MCHC: 34.3 g/dL (ref 30.0–36.0)
MCV: 102 fL — ABNORMAL HIGH (ref 80.0–100.0)
Monocytes Absolute: 0.7 10*3/uL (ref 0.1–1.0)
Monocytes Relative: 10 %
Neutro Abs: 4.5 10*3/uL (ref 1.7–7.7)
Neutrophils Relative %: 62 %
Platelets: 174 10*3/uL (ref 150–400)
RBC: 3.52 MIL/uL — ABNORMAL LOW (ref 4.22–5.81)
RDW: 16.2 % — ABNORMAL HIGH (ref 11.5–15.5)
WBC: 7.1 10*3/uL (ref 4.0–10.5)
nRBC: 0 % (ref 0.0–0.2)

## 2022-08-17 LAB — RESP PANEL BY RT-PCR (FLU A&B, COVID) ARPGX2
Influenza A by PCR: NEGATIVE
Influenza B by PCR: NEGATIVE
SARS Coronavirus 2 by RT PCR: NEGATIVE

## 2022-08-17 LAB — COMPREHENSIVE METABOLIC PANEL
ALT: 11 U/L (ref 0–44)
AST: 26 U/L (ref 15–41)
Albumin: 3.3 g/dL — ABNORMAL LOW (ref 3.5–5.0)
Alkaline Phosphatase: 136 U/L — ABNORMAL HIGH (ref 38–126)
Anion gap: 18 — ABNORMAL HIGH (ref 5–15)
BUN: 19 mg/dL (ref 8–23)
CO2: 28 mmol/L (ref 22–32)
Calcium: 9.6 mg/dL (ref 8.9–10.3)
Chloride: 94 mmol/L — ABNORMAL LOW (ref 98–111)
Creatinine, Ser: 4.34 mg/dL — ABNORMAL HIGH (ref 0.61–1.24)
GFR, Estimated: 13 mL/min — ABNORMAL LOW (ref >60)
Glucose, Bld: 120 mg/dL — ABNORMAL HIGH (ref 70–99)
Potassium: 3.9 mmol/L (ref 3.5–5.1)
Sodium: 140 mmol/L (ref 135–145)
Total Bilirubin: 0.3 mg/dL (ref 0.3–1.2)
Total Protein: 6.7 g/dL (ref 6.5–8.1)

## 2022-08-17 LAB — LIPASE, BLOOD: Lipase: 53 U/L — ABNORMAL HIGH (ref 11–51)

## 2022-08-17 NOTE — ED Provider Triage Note (Signed)
Emergency Medicine Provider Triage Evaluation Note  [name] , a 81 y.o. male was evaluated in triage. He has a h/o ESRD an dementia.  Brought by EMS from home. He had dialysis today. He has been fatigued and twitching. Pt does not have any complaints other than not feeling well.  No vomiting or diarrhea.  Review of Systems  Positive: Abdominal pain, malaise Negative: Fever  Physical Exam  There were no vitals taken for this visit. Gen:   Awake, no distress   Resp:  Normal effort  MSK:   Moves extremities without difficulty  Other:  Generalized abdominal tenderness to palpation, lungs clear to auscultation  Medical Decision Making  Medically screening exam initiated at 7:29 PM.  Appropriate orders placed.  [name] was informed that the remainder of the evaluation will be completed by another provider, this initial triage assessment does not replace that evaluation, and the importance of remaining in the ED until their evaluation is complete.     Carlisle Cater, PA-C 08/17/22 1934

## 2022-08-17 NOTE — ED Provider Notes (Signed)
[facility] EMERGENCY DEPARTMENT Provider Note   CSN: [id number] Arrival date & time: 08/17/22  1929    History  Chief Complaint  Patient presents with   Tremors    [name] is a 81 y.o. male history of hypertension, Alzheimer's, CAD, ESRD M,W,F here for evaluation of tremors.  Family states around 530 he returned from dialysis.  He appeared sleepy.  Family fed him a snack.  Patient's wife and caregiver noted he started to have eyes rolling into the back of his head, twitching of his left hand and garbled speech.  Lasted for approximately 5 minutes or so.  He was normal back to baseline on EMS arrival however in transport he had said with full body tremors.  No meds with EMS prior to arrival.  She has no complaints on exam.  When asked why he is here he states he is not sure.  States he feels fatigued.  He denies any pain, shortness of breath.  EMS states full dialysis session today.  Discussed with wife via telephone.  No history of seizures.  He had no facial droop, unilateral deficits at home.  Family unsure of syncope however states "he was out of it" and could not answer questions.     HPI     Home Medications Prior to Admission medications   Medication Sig Start Date End Date Taking? Authorizing Provider  acetaminophen (TYLENOL) 325 MG tablet Take 1-2 tablets (325-650 mg total) by mouth every 4 (four) hours as needed for mild pain. Patient taking differently: Take 650 mg by mouth in the morning and at bedtime. 08/01/18   Love, Ivan Anchors, PA-C  albuterol (VENTOLIN HFA) 108 (90 Base) MCG/ACT inhaler Inhale 1-2 puffs into the lungs daily as needed for wheezing or shortness of breath.    [provider]  allopurinol (ZYLOPRIM) 100 MG tablet Take 100 mg by mouth daily.    [provider]  Blood Glucose Monitoring Suppl (FREESTYLE FREEDOM LITE) w/Device KIT Use to check blood sugar 2 times per day dx code E11.65 10/21/18   Elayne Snare, MD   camphor-menthol [facility]) lotion Apply 1 application topically 4 (four) times daily as needed for itching.     [provider]  Carboxymethylcellulose Sodium (THERATEARS) 0.25 % SOLN Place 1 drop into both eyes 3 (three) times daily as needed (for dryness).    [provider]  cetirizine (ZYRTEC) 10 MG tablet Take 10 mg by mouth daily as needed for allergies.    [provider]  cyanocobalamin (,VITAMIN B-12,) 1000 MCG/ML injection Inject 1 mL (1,000 mcg total) into the muscle every 30 (thirty) days. 04/13/22   Elby Showers, MD  donepezil (ARICEPT) 10 MG tablet Take 1 tablet (10 mg total) by mouth at bedtime. 09/26/21   Melvenia Beam, MD  doxercalciferol (HECTOROL) 4 MCG/2ML injection Inject 2.5 mLs (5 mcg total) into the vein every Monday, Wednesday, and Friday with hemodialysis. 06/18/22   Allie Bossier, MD  fluticasone (FLONASE) 50 MCG/ACT nasal spray Place 1 spray into both nostrils daily. Patient taking differently: Place 2 sprays into both nostrils daily as needed for allergies. 10/08/14   Mikhail, Maryann, DO  FORTEO 600 MCG/2.4ML SOPN INJECT 20 MCG UNDER THE SKIN DAILY (DISCARD 28 DAYS AFTER INITIAL USE) Patient taking differently: Inject 20 mcg into the skin at bedtime. (DISCARD 28 DAYS AFTER INITIAL USE) 05/14/22   Elayne Snare, MD  glucose blood (FREESTYLE LITE) test strip USE AS DIRECTED THREE TIMES  DAILY 02/16/22   Elayne Snare, MD  hydrocortisone 2.5 % cream Apply 1 application topically 3 (three) times daily as needed (skin irritation (legs & arms)).  01/13/19   [provider]  Lancets (FREESTYLE) lancets Use as instructed to check blood sugar 2 times per day dx code E11.65 02/22/16   Elayne Snare, MD  levothyroxine (SYNTHROID, LEVOTHROID) 50 MCG tablet Take 1 tablet (50 mcg total) by mouth daily. Patient taking differently: Take 50 mcg by mouth daily before breakfast. 08/06/14   Elby Showers, MD  memantine (NAMENDA) 10 MG tablet Take 1 tablet (10 mg  total) by mouth 2 (two) times daily. 09/26/21   Melvenia Beam, MD  midodrine (PROAMATINE) 10 MG tablet Take one pill prior to hemodialysis on MWF Patient taking differently: Take 10 mg by mouth See admin instructions. Take 10 mg by mouth prior to hemodialysis on Mon/Wed/Fri 08/13/18   Love, Ivan Anchors, PA-C  Nutritional Supplements (FEEDING SUPPLEMENT, NEPRO CARB STEADY,) LIQD Take 237 mLs by mouth in the morning.    [provider]  omeprazole (PRILOSEC) 40 MG capsule Take 1 capsule (40 mg total) by mouth daily. Patient taking differently: Take 40 mg by mouth daily before breakfast. 03/13/18   Esterwood, Amy S, PA-C  QUEtiapine (SEROQUEL) 25 MG tablet Take 1 tablet (25 mg total) by mouth at bedtime. 08/06/22   Elby Showers, MD  repaglinide (PRANDIN) 0.5 MG tablet TAKE 1 TABLET(0.5 MG) BY MOUTH TWICE DAILY BEFORE A MEAL Patient taking differently: Take 0.5 mg by mouth See admin instructions. Take 0.5 mg by mouth at 5 pm (AFTER dialysis) only on Mon/Wed/Fri 05/29/22   Elayne Snare, MD  rosuvastatin (CRESTOR) 10 MG tablet Take 1 tablet (10 mg total) by mouth daily. 06/11/22   Eugenie Filler, MD  sevelamer carbonate (RENVELA) 800 MG tablet Take 1,600-2,400 mg by mouth See admin instructions. Take 2,400 mg by mouth three times a day with meals and 1,600 mg with snacks    [provider]  Syringe/Needle, Disp, (SYRINGE 3CC/25GX1") 25G X 1" 3 ML MISC 1 each by Does not apply route every 30 (thirty) days. 04/13/22   Elby Showers, MD  TRADJENTA 5 MG TABS tablet Take 1 tablet (5 mg total) by mouth daily. 11/16/21   Elayne Snare, MD      Allergies    Ambien [zolpidem tartrate] and Penicillins    Review of Systems   Review of Systems  Unable to perform ROS: Dementia  Constitutional:  Positive for fatigue.  Neurological:  Positive for tremors.  All other systems reviewed and are negative.   Physical Exam Updated Vital Signs BP (!) 98/56   Pulse 68   Temp 97.6 F (36.4 C) (Oral)    Resp 19   Ht _0  (1.727 m)   Wt 64.2 kg   SpO2 95%   BMI 21.52 kg/m  Physical Exam Vitals and nursing note reviewed.  Constitutional:      General: He is not in acute distress.    Appearance: He is well-developed. He is ill-appearing (chronically ill appearing). He is not toxic-appearing or diaphoretic.  HENT:     Head: Atraumatic.  Eyes:     Pupils: Pupils are equal, round, and reactive to light.  Cardiovascular:     Rate and Rhythm: Normal rate and regular rhythm.     Pulses: Normal pulses.     Heart sounds: Normal heart sounds.  Pulmonary:     Effort: Pulmonary effort is normal. No  respiratory distress.     Breath sounds: Normal breath sounds.  Abdominal:     General: Bowel sounds are normal. There is no distension.     Palpations: Abdomen is soft.     Tenderness: There is no abdominal tenderness. There is no right CVA tenderness, left CVA tenderness, guarding or rebound.  Musculoskeletal:        General: Normal range of motion.     Cervical back: Normal range of motion and neck supple.     Comments: No bony tenderness, full ROM. Dialysis access to BUE with pal thrill.   Skin:    General: Skin is warm and dry.     Capillary Refill: Capillary refill takes less than 2 seconds.     Comments: No edema, erythema, warmth  Neurological:     General: No focal deficit present.     Mental Status: He is alert and oriented to person, place, and time.     ED Results / Procedures / Treatments   Labs (all labs ordered are listed, but only abnormal results are displayed) Labs Reviewed  CBC WITH DIFFERENTIAL/PLATELET - Abnormal; Notable for the following components:      Result Value   RBC 3.52 (*)    Hemoglobin 12.3 (*)    HCT 35.9 (*)    MCV 102.0 (*)    MCH 34.9 (*)    RDW 16.2 (*)    All other components within normal limits  COMPREHENSIVE METABOLIC PANEL - Abnormal; Notable for the following components:   Chloride 94 (*)    Glucose, Bld 120 (*)    Creatinine, Ser 4.34  (*)    Albumin 3.3 (*)    Alkaline Phosphatase 136 (*)    GFR, Estimated 13 (*)    Anion gap 18 (*)    All other components within normal limits  LIPASE, BLOOD - Abnormal; Notable for the following components:   Lipase 53 (*)    All other components within normal limits  RESP PANEL BY RT-PCR (FLU A&B, COVID) [id number]    EKG EKG Interpretation  Date/Time:  Friday August 17 2022 19:44:36 EST Ventricular Rate:  72 PR Interval:  196 QRS Duration: 84 QT Interval:  554 QTC Calculation: 606 R Axis:   -45 Text Interpretation: Normal sinus rhythm Left anterior fascicular block Septal infarct , age undetermined Abnormal ECG When compared with ECG of 21-Jun-2022 00:28, No significant change since last tracing Confirmed by Aletta Edouard [phone]) on 08/17/2022 9:14:19 PM  Radiology CT HEAD WO CONTRAST (5MM)  Result Date: 08/17/2022 CLINICAL DATA:  Twitching, possible seizure activity EXAM: CT HEAD WITHOUT CONTRAST TECHNIQUE: Contiguous axial images were obtained from the base of the skull through the vertex without intravenous contrast. RADIATION DOSE REDUCTION: This exam was performed according to the departmental dose-optimization program which includes automated exposure control, adjustment of the mA and/or kV according to patient size and/or use of iterative reconstruction technique. COMPARISON:  05/24/2022 FINDINGS: Brain: No acute hemorrhage, acute infarction or space-occupying mass lesion is noted. Chronic white matter ischemic changes and generalized atrophy are again noted. Bifrontal fluid collections are again identified stable in appearance from the prior CT examination left slightly greater than right consistent with chronic subdural hematomas. No acute component is noted. Vascular: No hyperdense vessel or unexpected calcification. Skull: Negative for fracture or focal lesion. Prior bilateral burr holes are again noted. Sinuses/Orbits: No acute finding. Other: None. IMPRESSION: Chronic  bilateral frontal subdural hematomas stable in appearance from the prior exam. No acute component  is noted. Electronically Signed   By: Inez Catalina M.D.   On: 08/17/2022 23:04   DG Chest 2 View  Result Date: 08/17/2022 CLINICAL DATA:  Generalized weakness EXAM: CHEST - 2 VIEW COMPARISON:  06/21/2022 FINDINGS: Cardiac shadow is prominent but stable. Aortic calcifications are seen. The lungs are well aerated bilaterally. No focal infiltrate or effusion noted. No bony abnormality is seen. IMPRESSION: No active cardiopulmonary disease. Electronically Signed   By: Inez Catalina M.D.   On: 08/17/2022 22:59    Procedures Procedures    Medications Ordered in ED Medications - No data to display  ED Course/ Medical Decision Making/ A&P Clinical Course as of 08/17/22 2355  Fri Aug 17, 2022  2352 Hx of alzheimer's and ESRD on HD. After HD, wasn't feeling well. Eyes started rolling back and started shaking on left side. 5 minutes. EMS arrival he was fine. Had some generalized shaking on all ext while awake. Having abdominal pain so CT a/p. If normal, recommend admission for syncope versus seizure activity.  [GL]    Clinical Course User Index [GL] Adolphus Birchwood, PA-C    81 year old here for evaluation of syncope versus seizure-like activity.  ESRD last session today.  Apparently returned around 530 from dialysis and appeared fatigued and sleepy.  Family fed patient a snack however he developed eyes rolling into the back of his head, garbled speech, seemed unresponsive and had shaking of his left upper extremity.  Lasted for approximately 5 minutes.  No tongue injury, unsure if urinary incontinence as this is at baseline for him.  Apparently with EMS on arrival patient had full body tremors.  No meds prior to arrival.  On arrival patient pleasantly confused, history of Alzheimer's.  He denies any complaints.  States he feels generally fatigued.  Labs and imaging personally viewed and  interpreted:  CBC leukocytosis, hemoglobin 12.3 CMP alk phos 136, anion gap 18 Lipase 53 COVID/ Flu neg CT head chronic subdural hematomas, stable from prior exam DG chest without significant findings EKG without ischemic changes  Patient reassessed. Now stating he is having some abd pain. Will get CT given mild elevated in alk phos and lipase.  Care transferred to [facility], PA-C who will FU on imaging. Suspect will need admission for syncope vs seizure work up.                           Medical Decision Making Amount and/or Complexity of Data Reviewed Independent Historian: spouse External Data Reviewed: labs, radiology, ECG and notes. Labs: ordered. Decision-making details documented in ED Course. Radiology: ordered and independent interpretation performed. Decision-making details documented in ED Course. ECG/medicine tests: ordered and independent interpretation performed. Decision-making details documented in ED Course.  Risk OTC drugs. Prescription drug management. Decision regarding hospitalization. Diagnosis or treatment significantly limited by social determinants of health.          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

## 2022-08-17 NOTE — ED Triage Notes (Signed)
BIB EMS from home . Dialysis today.  Family noted when he got home he was "twitching" intermittently.  EMS observed a "full body twitch" intermittently.  Hx of dementia.

## 2022-08-18 ENCOUNTER — Observation Stay (HOSPITAL_COMMUNITY): Payer: Medicare Other

## 2022-08-18 DIAGNOSIS — I5030 Unspecified diastolic (congestive) heart failure: Secondary | ICD-10-CM | POA: Insufficient documentation

## 2022-08-18 DIAGNOSIS — R569 Unspecified convulsions: Secondary | ICD-10-CM

## 2022-08-18 DIAGNOSIS — R55 Syncope and collapse: Secondary | ICD-10-CM | POA: Diagnosis not present

## 2022-08-18 DIAGNOSIS — G4752 REM sleep behavior disorder: Secondary | ICD-10-CM | POA: Insufficient documentation

## 2022-08-18 LAB — CBG MONITORING, ED
Glucose-Capillary: 87 mg/dL (ref 70–99)
Glucose-Capillary: 81 mg/dL (ref 70–99)
Glucose-Capillary: 100 mg/dL — ABNORMAL HIGH (ref 70–99)
Glucose-Capillary: 111 mg/dL — ABNORMAL HIGH (ref 70–99)

## 2022-08-18 LAB — MAGNESIUM: Magnesium: 2.3 mg/dL (ref 1.7–2.4)

## 2022-08-18 MED ORDER — ALLOPURINOL 100 MG PO TABS
100.0000 mg | ORAL_TABLET | Freq: Every day | ORAL | Status: DC
  Administered 2022-08-19: 100 mg via ORAL
  Filled 2022-08-18 (×2): qty 1

## 2022-08-18 MED ORDER — QUETIAPINE FUMARATE 25 MG PO TABS
25.0000 mg | ORAL_TABLET | Freq: Every day | ORAL | Status: DC
  Administered 2022-08-18: 25 mg via ORAL
  Filled 2022-08-18: qty 1

## 2022-08-18 MED ORDER — SEVELAMER CARBONATE 800 MG PO TABS
2400.0000 mg | ORAL_TABLET | Freq: Three times a day (TID) | ORAL | Status: DC
  Administered 2022-08-18 – 2022-08-19 (×3): 2400 mg via ORAL
  Filled 2022-08-18 (×3): qty 3

## 2022-08-18 MED ORDER — MEMANTINE HCL 10 MG PO TABS
10.0000 mg | ORAL_TABLET | Freq: Two times a day (BID) | ORAL | Status: DC
  Administered 2022-08-18 – 2022-08-19 (×2): 10 mg via ORAL
  Filled 2022-08-18 (×4): qty 1

## 2022-08-18 MED ORDER — LORAZEPAM 2 MG/ML IJ SOLN
1.0000 mg | Freq: Once | INTRAMUSCULAR | Status: AC
  Administered 2022-08-18: 1 mg via INTRAMUSCULAR
  Filled 2022-08-18: qty 1

## 2022-08-18 MED ORDER — MIDODRINE HCL 5 MG PO TABS
10.0000 mg | ORAL_TABLET | ORAL | Status: AC
  Administered 2022-08-18: 10 mg via ORAL
  Filled 2022-08-18: qty 2

## 2022-08-18 MED ORDER — ACETAMINOPHEN 325 MG PO TABS: 650.0000 mg | ORAL_TABLET | Freq: Four times a day (QID) | ORAL | Status: DC | PRN

## 2022-08-18 MED ORDER — ACETAMINOPHEN 650 MG RE SUPP: 650.0000 mg | Freq: Four times a day (QID) | RECTAL | Status: DC | PRN

## 2022-08-18 MED ORDER — SEVELAMER CARBONATE 800 MG PO TABS: 1600.0000 mg | ORAL_TABLET | Freq: Two times a day (BID) | ORAL | Status: DC | PRN

## 2022-08-18 MED ORDER — LACOSAMIDE 50 MG PO TABS
50.0000 mg | ORAL_TABLET | Freq: Two times a day (BID) | ORAL | Status: DC
  Administered 2022-08-18 – 2022-08-19 (×2): 50 mg via ORAL
  Filled 2022-08-18 (×2): qty 1

## 2022-08-18 MED ORDER — INSULIN ASPART 100 UNIT/ML IJ SOLN: 0.0000 [IU] | Freq: Three times a day (TID) | INTRAMUSCULAR | Status: DC

## 2022-08-18 MED ORDER — INSULIN ASPART 100 UNIT/ML IJ SOLN: 0.0000 [IU] | Freq: Every day | INTRAMUSCULAR | Status: DC

## 2022-08-18 MED ORDER — METOPROLOL TARTRATE 5 MG/5ML IV SOLN
2.5000 mg | INTRAVENOUS | Status: AC
  Administered 2022-08-18: 2.5 mg via INTRAVENOUS
  Filled 2022-08-18: qty 5

## 2022-08-18 MED ORDER — LACOSAMIDE 50 MG PO TABS: 100.0000 mg | ORAL_TABLET | Freq: Two times a day (BID) | ORAL | Status: DC

## 2022-08-18 MED ORDER — ROSUVASTATIN CALCIUM 5 MG PO TABS
10.0000 mg | ORAL_TABLET | Freq: Every day | ORAL | Status: DC
  Administered 2022-08-19: 10 mg via ORAL
  Filled 2022-08-18: qty 2

## 2022-08-18 MED ORDER — LEVOTHYROXINE SODIUM 50 MCG PO TABS
50.0000 ug | ORAL_TABLET | Freq: Every day | ORAL | Status: DC
  Administered 2022-08-19: 50 ug via ORAL
  Filled 2022-08-18: qty 1

## 2022-08-18 NOTE — ED Notes (Signed)
Wife updated

## 2022-08-18 NOTE — Consult Note (Signed)
NEUROLOGY CONSULTATION NOTE   Date of service: August 18, 2022 Patient Name: [name] MRN:  [id number] DOB:  1941/05/28 Reason for consult: "possible Seizure" Requesting physician: "Dr. Si Raider" _ _ _   _ __   _ __ _ _  __ __   _ __   __ _  History of Present Illness   [name] is a 81 y.o. male with PMH significant for  has a past medical history of Allergy, Alzheimer's disease ([place]) (01/19/2021), Anemia, Arthritis, Cataract, Coronary artery disease, Diabetes mellitus, Diverticulitis, ED (erectile dysfunction), Elevated homocysteine, ESRD (end stage renal disease) on dialysis ([place]) (03/2015), GERD (gastroesophageal reflux disease), Gout, Hiatal hernia, Hyperlipidemia, Hypertension, Hypothyroidism, PVD (peripheral vascular disease) ([place]), Renal insufficiency, and Sleep apnea. who presents with  tremors.    Family states around 530 he returned from dialysis.  He appeared sleepy.  Family fed him a snack.  Patient's wife and caregiver noted he started to have eyes rolling into the back of his head, twitching of his left hand and garbled speech.  Lasted for approximately 5 minutes or so.  He was normal back to baseline on EMS arrival however in transport he had said with full body tremors.  No meds with EMS prior to arrival.  On arrival patient pleasantly confused, history of Alzheimer's.  He denies any complaints.  some ativan on arrival.  States he feels generally fatigued.   No evidence of seizures on my exam.  Baseline confusion noted but showing history of dementia.  EMS states full dialysis session today.   Baseline Functional Status: No history of seizures.  He had no facial droop, unilateral deficits at home.  Family unsure of syncope however states "he was out of it" and could not answer questions.  He does sundown with worse confusion and agitation early evenings.  Also poor sleep with REM dream enactment and insomnia.  All CNS imaging personally reviewed.   ROS   all other  systems reviewed and are negative unable to perform robust ROS due to baseline dementia.  Past History   I have reviewed the following:  Past Medical History:  Past Medical History:  Diagnosis Date   Allergy    Alzheimer's disease ([place]) 01/19/2021   Anemia    Arthritis    Cataract    bil cateracts removed   Coronary artery disease    Diabetes mellitus    Type 2   Diverticulitis    ED (erectile dysfunction)    Elevated homocysteine    ESRD (end stage renal disease) on dialysis ([place]) 03/2015   M-W-F dialysis   GERD (gastroesophageal reflux disease)    pepto    Gout    Hiatal hernia    Hyperlipidemia    Hypertension    Hypothyroidism    PVD (peripheral vascular disease) ([place])    has plastic aorta   Renal insufficiency    Sleep apnea    does not wear c-pap    No family history on file. Family History  Problem Relation Age of Onset   Aneurysm Mother        brain   Heart disease Father    Stroke Father    Hypertension Father    Diabetes Father    Dementia Neg Hx    Colon cancer Neg Hx    Esophageal cancer Neg Hx    Pancreatic cancer Neg Hx    Prostate cancer Neg Hx    Rectal cancer Neg Hx    Stomach cancer  Neg Hx     Allergies:  Allergies  Allergen Reactions   Ambien [Zolpidem Tartrate] Other (See Comments)    Hallucinations and "felt crazy"    Penicillins Hives and Rash    Has patient had a PCN reaction causing immediate rash, facial/tongue/throat swelling, SOB or lightheadedness with hypotension: Yes Has patient had a PCN reaction causing severe rash involving mucus membranes or skin necrosis: Yes Has patient had a PCN reaction that required hospitalization: No Has patient had a PCN reaction occurring within the last 10 years: No If all of the above answers are "NO", then may proceed with Cephalosporin use.     Social History:   reports that he quit smoking about 27 years ago. His smoking use included cigarettes. He has never been exposed to tobacco  smoke. He has never used smokeless tobacco. He reports that he does not drink alcohol and does not use drugs.    Medications   (Not in a hospital admission)     Current Facility-Administered Medications:    acetaminophen (TYLENOL) tablet 650 mg, 650 mg, Oral, Q6H PRN **OR** acetaminophen (TYLENOL) suppository 650 mg, 650 mg, Rectal, Q6H PRN, Shela Leff, MD   allopurinol (ZYLOPRIM) tablet 100 mg, 100 mg, Oral, Daily, Wouk, Ailene Rud, MD   insulin aspart (novoLOG) injection 0-5 Units, 0-5 Units, Subcutaneous, QHS, Rathore, Vasundhra, MD   insulin aspart (novoLOG) injection 0-6 Units, 0-6 Units, Subcutaneous, TID WC, Shela Leff, MD   [START ON 08/19/2022] levothyroxine (SYNTHROID) tablet 50 mcg, 50 mcg, Oral, QAC breakfast, Wouk, Ailene Rud, MD   memantine [facility]) tablet 10 mg, 10 mg, Oral, BID, Wouk, Ailene Rud, MD   QUEtiapine (SEROQUEL) tablet 25 mg, 25 mg, Oral, QHS, Wouk, Ailene Rud, MD   rosuvastatin (CRESTOR) tablet 10 mg, 10 mg, Oral, Daily, Wouk, Ailene Rud, MD   sevelamer carbonate (RENVELA) tablet 1,600 mg, 1,600 mg, Oral, BID BM PRN, Wouk, Ailene Rud, MD   sevelamer carbonate (RENVELA) tablet 2,400 mg, 2,400 mg, Oral, TID WC, Wouk, Ailene Rud, MD  Current Outpatient Medications:    acetaminophen (TYLENOL) 325 MG tablet, Take 1-2 tablets (325-650 mg total) by mouth every 4 (four) hours as needed for mild pain. (Patient taking differently: Take 650 mg by mouth in the morning and at bedtime.), Disp: , Rfl:    albuterol (VENTOLIN HFA) 108 (90 Base) MCG/ACT inhaler, Inhale 1-2 puffs into the lungs daily as needed for wheezing or shortness of breath., Disp: , Rfl:    allopurinol (ZYLOPRIM) 100 MG tablet, Take 100 mg by mouth daily., Disp: , Rfl:    butalbital-acetaminophen-caffeine (FIORICET) 50-325-40 MG tablet, Take 1 tablet by mouth every 6 (six) hours as needed for headache., Disp: , Rfl:    camphor-menthol (SARNA) lotion, Apply 1 application topically  4 (four) times daily as needed for itching. , Disp: , Rfl:    Carboxymethylcellulose Sodium (THERATEARS) 0.25 % SOLN, Place 1 drop into both eyes 3 (three) times daily as needed (for dryness)., Disp: , Rfl:    cetirizine (ZYRTEC) 10 MG tablet, Take 10 mg by mouth daily as needed for allergies., Disp: , Rfl:    cyanocobalamin (,VITAMIN B-12,) 1000 MCG/ML injection, Inject 1 mL (1,000 mcg total) into the muscle every 30 (thirty) days., Disp: 10 mL, Rfl: 0   donepezil (ARICEPT) 10 MG tablet, Take 1 tablet (10 mg total) by mouth at bedtime., Disp: 90 tablet, Rfl: 0   doxercalciferol (HECTOROL) 4 MCG/2ML injection, Inject 2.5 mLs (5 mcg total) into the vein every Monday,  Wednesday, and Friday with hemodialysis., Disp: 2 mL, Rfl: 0   fluticasone (FLONASE) 50 MCG/ACT nasal spray, Place 1 spray into both nostrils daily. (Patient taking differently: Place 2 sprays into both nostrils daily as needed for allergies.), Disp: 16 g, Rfl: 2   hydrocortisone 2.5 % cream, Apply 1 application topically 3 (three) times daily as needed (skin irritation (legs & arms)). , Disp: , Rfl:    levothyroxine (SYNTHROID, LEVOTHROID) 50 MCG tablet, Take 1 tablet (50 mcg total) by mouth daily. (Patient taking differently: Take 50 mcg by mouth daily before breakfast.), Disp: 90 tablet, Rfl: 1   memantine (NAMENDA) 10 MG tablet, Take 1 tablet (10 mg total) by mouth 2 (two) times daily., Disp: 180 tablet, Rfl: 0   midodrine (PROAMATINE) 10 MG tablet, Take one pill prior to hemodialysis on MWF (Patient taking differently: Take 10 mg by mouth See admin instructions. Take 10 mg by mouth prior to hemodialysis on Mon/Wed/Fri), Disp: , Rfl:    Nutritional Supplements (FEEDING SUPPLEMENT, NEPRO CARB STEADY,) LIQD, Take 237 mLs by mouth in the morning., Disp: , Rfl:    omeprazole (PRILOSEC) 40 MG capsule, Take 1 capsule (40 mg total) by mouth daily. (Patient taking differently: Take 40 mg by mouth daily before breakfast.), Disp: 30 capsule, Rfl:  0   QUEtiapine (SEROQUEL) 25 MG tablet, Take 1 tablet (25 mg total) by mouth at bedtime., Disp: 90 tablet, Rfl: 0   repaglinide (PRANDIN) 0.5 MG tablet, TAKE 1 TABLET(0.5 MG) BY MOUTH TWICE DAILY BEFORE A MEAL (Patient taking differently: Take 0.5 mg by mouth See admin instructions. Take 0.5 mg by mouth at 5 pm (AFTER dialysis) only on Mon/Wed/Fri), Disp: 60 tablet, Rfl: 2   rosuvastatin (CRESTOR) 10 MG tablet, Take 1 tablet (10 mg total) by mouth daily., Disp: 30 tablet, Rfl: 1   sevelamer carbonate (RENVELA) 800 MG tablet, Take 1,600-2,400 mg by mouth See admin instructions. Take 2,400 mg by mouth three times a day with meals and 1,600 mg with snacks, Disp: , Rfl:    TRADJENTA 5 MG TABS tablet, Take 1 tablet (5 mg total) by mouth daily., Disp: 90 tablet, Rfl: 3   Blood Glucose Monitoring Suppl (FREESTYLE FREEDOM LITE) w/Device KIT, Use to check blood sugar 2 times per day dx code E11.65, Disp: 1 each, Rfl: 0   FORTEO 600 MCG/2.4ML SOPN, INJECT 20 MCG UNDER THE SKIN DAILY (DISCARD 28 DAYS AFTER INITIAL USE) (Patient not taking: Reported on 08/18/2022), Disp: 2.4 mL, Rfl: 2   glucose blood (FREESTYLE LITE) test strip, USE AS DIRECTED THREE TIMES DAILY, Disp: 300 strip, Rfl: 2   Lancets (FREESTYLE) lancets, Use as instructed to check blood sugar 2 times per day dx code E11.65, Disp: 100 each, Rfl: 3   Syringe/Needle, Disp, (SYRINGE 3CC/25GX1") 25G X 1" 3 ML MISC, 1 each by Does not apply route every 30 (thirty) days., Disp: 50 each, Rfl: 0  Vitals   Vitals:   08/18/22 0645 08/18/22 0700 08/18/22 0835 08/18/22 1115  BP: 129/85 (!) 129/93  (!) 109/57  Pulse: (!) 103   98  Resp: 17   12  Temp:   98.7 F (37.1 C)   TempSrc:   Oral Oral  SpO2: 99%   99%  Weight:      Height:         Body mass index is 21.52 kg/m.  Physical Exam   Current vital signs:    08/18/2022   11:15 AM 08/18/2022    7:00 AM 08/18/2022  6:45 AM  Vitals with BMI  Systolic 038 333 832  Diastolic 57 93 85   Pulse 98  103    Examination: GENERAL: Drowsy, but easily arousable, in NAD HEENT: Normocephalic and atraumatic, dry mm, no lymphadenopathy, no Thyromegally LUNGS: Clear to auscultation bilaterally CV: S1S2 RRR, equal pulses bilaterally. ABDOMEN: Soft, nontender, nondistended with normoactive BS Ext: warm, well perfused, intact peripheral pulses, no pedal edema  NEURO:  Mental Status: AA&O to name only, may be dementia baseline.  Answered I don't know to most questions and instructions. Language: unable to name watch or pen, "I don't know, why are we doing this" Cranial Nerves:  II: PERRL. Visual fields unable to test due to baseline confusion.  gaze can cross midline. III, IV, VI: EOM in tact. Eyelids elevate symmetrically. Blinks to threat.  V: Sensation intact V1-3 symmetrically  VII: no facial asymmetry   VIII: hearing intact to voice IX, X: Palate elevates symmetrically. Phonation is normal.  NV:BTYOMAYO shrug 5/5 and symmetrical  XII: tongue is midline without fasciculations. Motor:  RUE:   grip  5/5    biceps  5/5     triceps  5/5      LUE:   grip  5/5    biceps  5/5     triceps  5/5 RLE:    thigh  5/5    knee   5/5    plantar flexion  5/5   dorsiflexion  5/5        LLE:  thigh  5/5    knee   5/5    plantar flexion  5/5   dorsiflexion  5/5 Tone: normal and bulk is normal DTRs: 1+ and symmetrical throughout Sensory:  - Intact to light touch, symmetric systemwide - Decreased R upper and lower to pinprick,   - Propioception unable to test due to dementia confusion  Coordination/Complex Motor: unable to fully test due to dementia, does not understand questions, unable to mimic correctly, but does move extremities as if trying to mimic my movements - No abnormal Movt - Gait: pt was up walking to bathroom with RN, slightly off balance, but normal steppage gait, symmetric arm swing    NIHSS 1a Level of Conscious.: 0 1b LOC Questions: 1, name only 1c LOC Commands: 0 2 Best  Gaze: 0 3 Visual: 0 4 Facial Palsy: 0 5a Motor Arm - left: 0 5b Motor Arm - Right: 0 6a Motor Leg - Left: 0 6b Motor Leg - Right: 0 7 Limb Ataxia: unable to test legs due to dementia confusion, but able to move independently against gravity 8 Sensory: 1, decreased sensation on LUE and LLE 9 Best Language: 0 10 Dysarthria: 1 11 Extinct. and Inatten.: 0       TOTAL: 3   Premorbid modified Rankin scale (mRS): 08/18/2022, Score: 3 0-Completely asymptomatic and back to baseline post-stroke 1-No significant post stroke disability and can perform usual duties with stroke symptoms 2-Slight disability-UNABLE to perform all activities but does not need assistance  3-Moderate disability-requires help but walks WITHOUT assistance 4-Needs assistance to walk and tend to bodily needs 5-Severe disability-bedridden, incontinent, needs constant attention 6- Death   Labs & Imaging  I have reviewed labs in epic and the results pertinent to this consultation are:  BMP:  Lab Results  Component Value Date   NA 140 08/17/2022   K 3.9 08/17/2022   CO2 28 08/17/2022   GLUCOSE 120 (H) 08/17/2022   BUN 19 08/17/2022   CREATININE 4.34 (  H) 08/17/2022   CALCIUM 9.6 08/17/2022   GFRNONAA 13 (L) 08/17/2022   GFRAA 12 (L) 01/03/2021     Lab Results  Component Value Date   NA 140 08/17/2022   K 3.9 08/17/2022   CL 94 (L) 08/17/2022   CO2 28 08/17/2022   Liver Fx Panel:  Lab Results  Component Value Date   ALT 11 08/17/2022   AST 26 08/17/2022   ALKPHOS 136 (H) 08/17/2022   BILITOT 0.3 08/17/2022   WBC:  Lab Results  Component Value Date   WBC 7.1 08/17/2022   HGB 12.3 (L) 08/17/2022   HCT 35.9 (L) 08/17/2022   MCV 102.0 (H) 08/17/2022   PLT 174 08/17/2022   Lipid Panel:  Lab Results  Component Value Date   LDLCALC 30 05/17/2022   A1C:  Lab Results  Component Value Date   HGBA1C 6.5 (H) 05/17/2022   B12:  Lab Results  Component Value Date   FEOFHQRF75 883 10/02/2021    Folate:  Lab Results  Component Value Date   FOLATE 10.5 07/28/2020   Urine Drug Screen: No results found for: "LABOPIA", "COCAINSCRNUR", "LABBENZ", "AMPHETMU", "THCU", "LABBARB"  Alcohol Level No results found for: "ETH"  ___________________________________________________________________ I have reviewed the images obtained:, listed below  CT Head w/o 08/17/22: chronic bil frontal SDH, stable from 04/2022 imaging  CT abdomen: no acute GI findings, Compression fractures of all of the lumbar vertebral bodies are similar and most marked about L1 where there is complete vertebral body height loss. Vertebroplasty L3. Right hip ORIF.  MRI Brain 09/2021: Generalized brain atrophy. Chronic bilateral subdural collection without cerebral mass effect.  rEEG completed, report pending   Impression   Syncope versus Seizure  Possible metabolic issue as he was at dialysis today. Chronic SDH some abd pain and elevated lipase. Will get CT given mild elevated in alk phos and lipase  Qtc noted to be elevated to 600 on arrival iimproved to 520 this mornin  - held off on keppra at that time  Recommendations    - starting lacosamide (vimpat) 43m bid q1w, then increase to 1037mbid - seizure precautions - need to set up dialysis inpatient if here until Wed. 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 If you have any further questions, please contact the neurology consultation attending.  Signed,  MaDellia BeckwithAG[facility] Hospitalist Ph [phone] note will be edited by MD as needed.   If 7pm- 7am, please page neurology on call as listed in [facility]  I have seen the patient and reviewed the above note.  Sounds like he is not managing well at  home.  The presenting episode is very much consistent with a seizure for which he has multiple possible etiologies.  He will need long-term antiepileptic.  Neurology will continue to follow.  McRoland RackMD [facility] [phone] 7pm- 7am, please page neurology on call as listed in [facility]

## 2022-08-18 NOTE — Procedures (Addendum)
Routine EEG Report  [name] is a 81 y.o. male with a history of seizure-like episode who is undergoing an EEG to evaluate for seizures.  Report: This EEG was acquired with electrodes placed according to the International 10-20 electrode system (including Fp1, Fp2, F3, F4, C3, C4, P3, P4, O1, O2, T3, T4, T5, T6, A1, A2, Fz, Cz, Pz). The following electrodes were missing or displaced: none.  The occipital dominant rhythm was 6-7 Hz with overriding beta frequencies. This activity is reactive to stimulation. Drowsiness was manifested by background fragmentation; deeper stages of sleep were identified by K complexes and sleep spindles. There was no focal slowing. There were no interictal epileptiform discharges. There were no electrographic seizures identified. Photic stimulation and hyperventilation were not performed.  Impression and clinical correlation: This EEG was obtained while awake and asleep and is abnormal due to mild-to-moderate diffuse slowing indicative of global cerebral dysfunction. Epileptiform abnormalities were not seen during this recording.  Su Monks, MD [facility] [phone]  If 7pm- 7am, please page neurology on call as listed in [place].

## 2022-08-18 NOTE — ED Notes (Signed)
Got patient on the monitor straighten up in the bed family at bedside and call bell in reach

## 2022-08-18 NOTE — H&P (Signed)
History and Physical    [name] HGD:[id number] DOB: [birth year] DOA: 08/17/2022  PCP: Elby Showers, MD  Patient coming from: Home  Chief Complaint: Tremors  HPI: [name] is a 81 y.o. male with medical history significant of ESRD on HD MWF, hypothyroidism, hypotension on midodrine, hyperlipidemia, type 2 diabetes, dementia, paroxysmal A-fib not on anticoagulation due to history of small bowel AVMs/GI bleed, chronic combined CHF, CAD, PVD, sleep apnea, gout, anemia of chronic disease, DNR status, admission for COVID-19 in September 2023 presented to the ED via EMS for evaluation of seizure-like activity.  After he returned from dialysis he appeared fatigued and sleepy.  Family fed him a snack however he developed eyes rolling into the back of his head, garbled speech, seemed unresponsive, and had shaking of his left upper extremity. This episode lasted about 5 minutes and he was back to his baseline on EMS arrival, however, during transport he did have full body tremors.  In the ED, patient noted to be afebrile.  Labs showing no leukocytosis, hemoglobin 12.3 (higher than previous labs), glucose 120, magnesium pending.  CT head showing chronic bilateral frontal subdural hematomas which appear stable from prior exam.  Chest x-ray negative for acute finding.  CT abdomen pelvis was done due to borderline elevated lipase and negative for acute abnormality. Patient was given Ativan 1 mg.  TRH called to admit.  Patient is oriented to self only and not able to give any history.  He has no complaints.  Review of Systems:  Review of Systems  All other systems reviewed and are negative.   Past Medical History:  Diagnosis Date   Allergy    Alzheimer's disease ([place]) 01/19/2021   Anemia    Arthritis    Cataract    bil cateracts removed   Coronary artery disease    Diabetes mellitus    Type 2   Diverticulitis    ED (erectile dysfunction)    Elevated homocysteine    ESRD (end stage  renal disease) on dialysis ([place]) 03/2015   M-W-F dialysis   GERD (gastroesophageal reflux disease)    pepto    Gout    Hiatal hernia    Hyperlipidemia    Hypertension    Hypothyroidism    PVD (peripheral vascular disease) ([place])    has plastic aorta   Renal insufficiency    Sleep apnea    does not wear c-pap    Past Surgical History:  Procedure Laterality Date   ANGIOPLASTY Right 01/26/2021   Procedure: ANGIOPLASTY RIGHT SUBCLAVIAN VEIN;  Surgeon: Serafina Mitchell, MD;  Location: [place];  Service: Vascular;  Laterality: Right;   aortobifemoral bypass     AV FISTULA PLACEMENT Left 12/01/2013   Procedure: ARTERIOVENOUS (AV) FISTULA CREATION- LEFT BRACHIOCEPHALIC;  Surgeon: Angelia Mould, MD;  Location: [place];  Service: Vascular;  Laterality: Left;   BASCILIC VEIN TRANSPOSITION Right 07/27/2014   Procedure: BASCILIC VEIN TRANSPOSITION;  Surgeon: Angelia Mould, MD;  Location: [place];  Service: Vascular;  Laterality: Right;   BRAIN SURGERY  07/22/2018   BREAST SURGERY     left - granulomatous mastitis   BURR HOLE Bilateral 07/22/2018   Procedure: BILATERAL BURR HOLES;  Surgeon: Kristeen Miss, MD;  Location: [place];  Service: Neurosurgery;  Laterality: Bilateral;   COLONOSCOPY     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 ([place] HX)  1992   ENTEROSCOPY N/A 06/03/2018   Procedure: ENTEROSCOPY;  Surgeon: Lavena Bullion, DO;  Location: [facility] ENDOSCOPY;  Service: Gastroenterology;  Laterality: N/A;   ENTEROSCOPY N/A 11/20/2018   Procedure: ENTEROSCOPY;  Surgeon: Rush Landmark Telford Nab., MD;  Location: [place];  Service: Gastroenterology;  Laterality: N/A;   ENTEROSCOPY N/A 12/03/2019   Procedure: ENTEROSCOPY;  Surgeon: Lavena Bullion, DO;  Location: [facility] ENDOSCOPY;  Service: Gastroenterology;  Laterality: N/A;  push enteroscopy   ENTEROSCOPY N/A 07/04/2021   Procedure: ENTEROSCOPY;  Surgeon: Sharyn Creamer, MD;  Location: [facility] ENDOSCOPY;  Service: Gastroenterology;  Laterality: N/A;    ENTEROSCOPY N/A 11/05/2021   Procedure: ENTEROSCOPY;  Surgeon: Daryel November, MD;  Location: [facility] ENDOSCOPY;  Service: Gastroenterology;  Laterality: N/A;   EYE SURGERY Bilateral    cataracts   FISTULOGRAM Right 01/26/2021   Procedure: FISTULOGRAM RIGHT;  Surgeon: Serafina Mitchell, MD;  Location[facility] OR;  Service: Vascular;  Laterality: Right;   HEMODIALYSIS INPATIENT  01/17/2018       HOT HEMOSTASIS N/A 06/03/2018   Procedure: HOT HEMOSTASIS (ARGON PLASMA COAGULATION/BICAP);  Surgeon: Lavena Bullion, DO;  Location: [facility] ENDOSCOPY;  Service: Gastroenterology;  Laterality: N/A;   HOT HEMOSTASIS N/A 11/20/2018   Procedure: HOT HEMOSTASIS (ARGON PLASMA COAGULATION/BICAP);  Surgeon: Irving Copas., MD;  Location: [place];  Service: Gastroenterology;  Laterality: N/A;   HOT HEMOSTASIS N/A 12/03/2019   Procedure: HOT HEMOSTASIS (ARGON PLASMA COAGULATION/BICAP);  Surgeon: Lavena Bullion, DO;  Location: [facility] ENDOSCOPY;  Service: Gastroenterology;  Laterality: N/A;   INTRAMEDULLARY (IM) NAIL INTERTROCHANTERIC Right 11/08/2020   Procedure: INTRAMEDULLARY (IM) NAIL INTERTROCHANTRIC;  Surgeon: Erle Crocker, MD;  Location: [place];  Service: Orthopedics;  Laterality: Right;   REVISION OF ARTERIOVENOUS GORETEX GRAFT Right 09/01/2019   Procedure: REVISION OF ARTERIOVENOUS FISTULA RIGHT ARM;  Surgeon: Serafina Mitchell, MD;  Location: [place];  Service: Vascular;  Laterality: Right;   REVISON OF ARTERIOVENOUS FISTULA Left 02/09/2014   Procedure: REVISON OF LEFT ARTERIOVENOUS FISTULA - RESECTION OF RENDUNDANT VEIN;  Surgeon: Angelia Mould, MD;  Location: [place];  Service: Vascular;  Laterality: Left;   REVISON OF ARTERIOVENOUS FISTULA Right 01/26/2021   Procedure: REVISON OF ARTERIOVENOUS FISTULA RIGHT;  Surgeon: Serafina Mitchell, MD;  Location: [place];  Service: Vascular;  Laterality: Right;   SBO with lysis adhesions     SHUNTOGRAM Left 04/19/2014   Procedure: FISTULOGRAM;  Surgeon:  Angelia Mould, MD;  Location: [facility] CATH LAB;  Service: Cardiovascular;  Laterality: Left;   UNILATERAL UPPER EXTREMEITY ANGIOGRAM N/A 07/12/2014   Procedure: UNILATERAL UPPER Anselmo Rod;  Surgeon: Angelia Mould, MD;  Location: [facility] CATH LAB;  Service: Cardiovascular;  Laterality: N/A;     reports that he quit smoking about 27 years ago. His smoking use included cigarettes. He has never been exposed to tobacco smoke. He has never used smokeless tobacco. He reports that he does not drink alcohol and does not use drugs.  Allergies  Allergen Reactions   Ambien [Zolpidem Tartrate] Other (See Comments)    Hallucinations and "felt crazy"    Penicillins Hives and Rash    Has patient had a PCN reaction causing immediate rash, facial/tongue/throat swelling, SOB or lightheadedness with hypotension: Yes Has patient had a PCN reaction causing severe rash involving mucus membranes or skin necrosis: Yes Has patient had a PCN reaction that required hospitalization: No Has patient had a PCN reaction occurring within the last 10 years: No If all of the above answers are "NO", then may proceed with Cephalosporin use.     Family History  Problem Relation Age of Onset   Aneurysm Mother        brain   Heart disease Father    Stroke Father    Hypertension Father    Diabetes Father    Dementia Neg Hx    Colon cancer Neg Hx    Esophageal cancer Neg Hx    Pancreatic cancer Neg Hx    Prostate cancer Neg Hx    Rectal cancer Neg Hx    Stomach cancer Neg Hx     Prior to Admission medications   Medication Sig Start Date End Date Taking? Authorizing Provider  acetaminophen (TYLENOL) 325 MG tablet Take 1-2 tablets (325-650 mg total) by mouth every 4 (four) hours as needed for mild pain. Patient taking differently: Take 650 mg by mouth in the morning and at bedtime. 08/01/18   Love, Ivan Anchors, PA-C  albuterol (VENTOLIN HFA) 108 (90 Base) MCG/ACT inhaler Inhale 1-2 puffs into the lungs  daily as needed for wheezing or shortness of breath.    [provider]  allopurinol (ZYLOPRIM) 100 MG tablet Take 100 mg by mouth daily.    [provider]  Blood Glucose Monitoring Suppl (FREESTYLE FREEDOM LITE) w/Device KIT Use to check blood sugar 2 times per day dx code E11.65 10/21/18   Elayne Snare, MD  camphor-menthol [facility]) lotion Apply 1 application topically 4 (four) times daily as needed for itching.     [provider]  Carboxymethylcellulose Sodium (THERATEARS) 0.25 % SOLN Place 1 drop into both eyes 3 (three) times daily as needed (for dryness).    [provider]  cetirizine (ZYRTEC) 10 MG tablet Take 10 mg by mouth daily as needed for allergies.    [provider]  cyanocobalamin (,VITAMIN B-12,) 1000 MCG/ML injection Inject 1 mL (1,000 mcg total) into the muscle every 30 (thirty) days. 04/13/22   Elby Showers, MD  donepezil (ARICEPT) 10 MG tablet Take 1 tablet (10 mg total) by mouth at bedtime. 09/26/21   Melvenia Beam, MD  doxercalciferol (HECTOROL) 4 MCG/2ML injection Inject 2.5 mLs (5 mcg total) into the vein every Monday, Wednesday, and Friday with hemodialysis. 06/18/22   Allie Bossier, MD  fluticasone (FLONASE) 50 MCG/ACT nasal spray Place 1 spray into both nostrils daily. Patient taking differently: Place 2 sprays into both nostrils daily as needed for allergies. 10/08/14   Mikhail, Maryann, DO  FORTEO 600 MCG/2.4ML SOPN INJECT 20 MCG UNDER THE SKIN DAILY (DISCARD 28 DAYS AFTER INITIAL USE) Patient taking differently: Inject 20 mcg into the skin at bedtime. (DISCARD 28 DAYS AFTER INITIAL USE) 05/14/22   Elayne Snare, MD  glucose blood (FREESTYLE LITE) test strip USE AS DIRECTED THREE TIMES DAILY 02/16/22   Elayne Snare, MD  hydrocortisone 2.5 % cream Apply 1 application topically 3 (three) times daily as needed (skin irritation (legs & arms)).  01/13/19   [provider]  Lancets (FREESTYLE) lancets Use as instructed to check  blood sugar 2 times per day dx code E11.65 02/22/16   Elayne Snare, MD  levothyroxine (SYNTHROID, LEVOTHROID) 50 MCG tablet Take 1 tablet (50 mcg total) by mouth daily. Patient taking differently: Take 50 mcg by mouth daily before breakfast. 08/06/14   Elby Showers, MD  memantine (NAMENDA) 10 MG tablet Take 1 tablet (10 mg total) by mouth 2 (two) times daily. 09/26/21   Melvenia Beam, MD  midodrine (PROAMATINE) 10 MG tablet Take one pill prior to hemodialysis on MWF Patient taking differently: Take 10  mg by mouth See admin instructions. Take 10 mg by mouth prior to hemodialysis on Mon/Wed/Fri 08/13/18   Love, Ivan Anchors, PA-C  Nutritional Supplements (FEEDING SUPPLEMENT, NEPRO CARB STEADY,) LIQD Take 237 mLs by mouth in the morning.    [provider]  omeprazole (PRILOSEC) 40 MG capsule Take 1 capsule (40 mg total) by mouth daily. Patient taking differently: Take 40 mg by mouth daily before breakfast. 03/13/18   Esterwood, Amy S, PA-C  QUEtiapine (SEROQUEL) 25 MG tablet Take 1 tablet (25 mg total) by mouth at bedtime. 08/06/22   Elby Showers, MD  repaglinide (PRANDIN) 0.5 MG tablet TAKE 1 TABLET(0.5 MG) BY MOUTH TWICE DAILY BEFORE A MEAL Patient taking differently: Take 0.5 mg by mouth See admin instructions. Take 0.5 mg by mouth at 5 pm (AFTER dialysis) only on Mon/Wed/Fri 05/29/22   Elayne Snare, MD  rosuvastatin (CRESTOR) 10 MG tablet Take 1 tablet (10 mg total) by mouth daily. 06/11/22   Eugenie Filler, MD  sevelamer carbonate (RENVELA) 800 MG tablet Take 1,600-2,400 mg by mouth See admin instructions. Take 2,400 mg by mouth three times a day with meals and 1,600 mg with snacks    [provider]  Syringe/Needle, Disp, (SYRINGE 3CC/25GX1") 25G X 1" 3 ML MISC 1 each by Does not apply route every 30 (thirty) days. 04/13/22   Elby Showers, MD  TRADJENTA 5 MG TABS tablet Take 1 tablet (5 mg total) by mouth daily. 11/16/21   Elayne Snare, MD    Physical Exam: Vitals:   08/17/22  2212 08/17/22 2215 08/17/22 2245 08/18/22 0100  BP:  (!) 98/56  100/60  Pulse:  68  73  Resp:  (!) _0 Temp:      TempSrc:      SpO2:    99%  Weight: 64.2 kg     Height: 5' 8" (1.727 m)       Physical Exam Vitals reviewed.  Constitutional:      General: He is not in acute distress. HENT:     Head: Normocephalic and atraumatic.  Eyes:     Extraocular Movements: Extraocular movements intact.  Cardiovascular:     Rate and Rhythm: Normal rate and regular rhythm.     Pulses: Normal pulses.  Pulmonary:     Effort: Pulmonary effort is normal. No respiratory distress.     Breath sounds: Normal breath sounds. No wheezing or rales.  Abdominal:     General: Bowel sounds are normal. There is no distension.     Palpations: Abdomen is soft.     Tenderness: There is no abdominal tenderness.  Musculoskeletal:     Cervical back: Normal range of motion.     Right lower leg: No edema.     Left lower leg: No edema.  Skin:    General: Skin is warm and dry.  Neurological:     General: No focal deficit present.     Mental Status: He is alert and oriented to person, place, and time.     Cranial Nerves: No cranial nerve deficit.     Sensory: No sensory deficit.     Motor: No weakness.     Labs on Admission: I have personally reviewed following labs and imaging studies  CBC: Recent Labs  Lab 08/17/22 1952  WBC 7.1  NEUTROABS 4.5  HGB 12.3*  HCT 35.9*  MCV 102.0*  PLT 989   Basic Metabolic Panel: Recent Labs  Lab 08/17/22 1952  NA 140  K 3.9  CL 94*  CO2 28  GLUCOSE 120*  BUN 19  CREATININE 4.34*  CALCIUM 9.6   GFR: Estimated Creatinine Clearance: 12.1 mL/min (A) (by C-G formula based on SCr of 4.34 mg/dL (H)). Liver Function Tests: Recent Labs  Lab 08/17/22 1952  AST 26  ALT 11  ALKPHOS 136*  BILITOT 0.3  PROT 6.7  ALBUMIN 3.3*   Recent Labs  Lab 08/17/22 1952  LIPASE 53*   No results for input(s): "AMMONIA" in the last 168 hours. Coagulation  Profile: No results for input(s): "INR", "PROTIME" in the last 168 hours. Cardiac Enzymes: No results for input(s): "CKTOTAL", "CKMB", "CKMBINDEX", "TROPONINI" in the last 168 hours. BNP (last 3 results) No results for input(s): "PROBNP" in the last 8760 hours. HbA1C: No results for input(s): "HGBA1C" in the last 72 hours. CBG: No results for input(s): "GLUCAP" in the last 168 hours. Lipid Profile: No results for input(s): "CHOL", "HDL", "LDLCALC", "TRIG", "CHOLHDL", "LDLDIRECT" in the last 72 hours. Thyroid Function Tests: No results for input(s): "TSH", "T4TOTAL", "FREET4", "T3FREE", "THYROIDAB" in the last 72 hours. Anemia Panel: No results for input(s): "VITAMINB12", "FOLATE", "FERRITIN", "TIBC", "IRON", "RETICCTPCT" in the last 72 hours. Urine analysis:    Component Value Date/Time   COLORURINE AMBER (A) 04/09/2022 2051   APPEARANCEUR CLOUDY (A) 04/09/2022 2051   LABSPEC 1.013 04/09/2022 2051   PHURINE 8.0 04/09/2022 2051   GLUCOSEU NEGATIVE 04/09/2022 2051   HGBUR SMALL (A) 04/09/2022 2051   BILIRUBINUR NEGATIVE 04/09/2022 2051   BILIRUBINUR neg 02/14/2015 1115   KETONESUR NEGATIVE 04/09/2022 2051   PROTEINUR >=300 (A) 04/09/2022 2051   UROBILINOGEN 0.2 02/20/2015 0944   NITRITE NEGATIVE 04/09/2022 2051   LEUKOCYTESUR LARGE (A) 04/09/2022 2051    Radiological Exams on Admission: CT ABDOMEN PELVIS WO CONTRAST  Result Date: 08/18/2022 CLINICAL DATA:  Pain and elevated lipase EXAM: CT ABDOMEN AND PELVIS WITHOUT CONTRAST TECHNIQUE: Multidetector CT imaging of the abdomen and pelvis was performed following the standard protocol without IV contrast. RADIATION DOSE REDUCTION: This exam was performed according to the departmental dose-optimization program which includes automated exposure control, adjustment of the mA and/or kV according to patient size and/or use of iterative reconstruction technique. COMPARISON:  CT abdomen and pelvis 06/14/2022 FINDINGS: Lower chest: Emphysema.  Bibasilar atelectasis/scarring. No acute abnormality. Hepatobiliary: No suspicious focal liver abnormality is seen. Subcentimeter low-density lesions are unchanged and presumably cysts. No specific follow-up imaging recommended. No gallstones, gallbladder wall thickening, or biliary dilatation. Pancreas: Unremarkable. No pancreatic ductal dilatation or surrounding inflammatory changes. Spleen: Normal in size without focal abnormality. Adrenals/Urinary Tract: Unremarkable adrenal glands. Atrophic native kidneys. Unchanged low-attenuation lesions in the kidneys are statistically likely to represent cysts. No follow-up is required. Stomach/Bowel: Stomach is within normal limits. Appendix appears normal. No evidence of bowel wall thickening, distention, or inflammatory changes. Vascular/Lymphatic: Advanced aortic atherosclerotic calcification. No abdominopelvic lymphadenopathy. Prior aorto bi-iliac stent graft. Reproductive: Unremarkable. Other: No free intraperitoneal fluid or air. Postsurgical changes anterior abdominal wall, unchanged. Musculoskeletal: No interval change. Compression fractures of all of the lumbar vertebral bodies are similar and most marked about L1 where there is complete vertebral body height loss. Vertebroplasty L3. Right hip ORIF. IMPRESSION: No acute abnormality in the abdomen or pelvis. Multiple chronic findings detailed and findings and unchanged from 06/14/2022. Electronically Signed   By: Placido Sou M.D.   On: 08/18/2022 00:00   CT HEAD WO CONTRAST (5MM)  Result Date: 08/17/2022 CLINICAL DATA:  Twitching, possible seizure activity EXAM: CT HEAD WITHOUT  CONTRAST TECHNIQUE: Contiguous axial images were obtained from the base of the skull through the vertex without intravenous contrast. RADIATION DOSE REDUCTION: This exam was performed according to the departmental dose-optimization program which includes automated exposure control, adjustment of the mA and/or kV according to patient  size and/or use of iterative reconstruction technique. COMPARISON:  05/24/2022 FINDINGS: Brain: No acute hemorrhage, acute infarction or space-occupying mass lesion is noted. Chronic white matter ischemic changes and generalized atrophy are again noted. Bifrontal fluid collections are again identified stable in appearance from the prior CT examination left slightly greater than right consistent with chronic subdural hematomas. No acute component is noted. Vascular: No hyperdense vessel or unexpected calcification. Skull: Negative for fracture or focal lesion. Prior bilateral burr holes are again noted. Sinuses/Orbits: No acute finding. Other: None. 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 No acute component is noted. Electronically Signed   By: Inez Catalina M.D.   On: 08/17/2022 23:04   DG Chest 2 View  Result Date: 08/17/2022 CLINICAL DATA:  Generalized weakness EXAM: CHEST - 2 VIEW COMPARISON:  06/21/2022 FINDINGS: Cardiac shadow is prominent but stable. Aortic calcifications are seen. The lungs are well aerated bilaterally. No focal infiltrate or effusion noted. No bony abnormality is seen. IMPRESSION: No active cardiopulmonary disease. Electronically Signed   By: Inez Catalina M.D.   On: 08/17/2022 22:59    EKG: Independently reviewed.  Sinus rhythm, LAFB, flattened T waves, QTc 606.  QT interval increased since prior tracing.  Assessment and Plan  Possible seizure After he returned from dialysis yesterday he appeared fatigued and sleepy.  Family fed him a snack however he developed eyes rolling into the back of his head, garbled speech, seemed unresponsive, and had shaking of his left upper extremity. This episode lasted about 5 minutes and he was back to his baseline on EMS arrival, however, during transport he did have full body tremors.  CT head showing chronic bilateral frontal subdural hematomas which appear stable from prior exam.  No  focal neurodeficit on exam. -Cardiac monitoring -EEG -Seizure precautions  QT prolongation QTc 606. Potassium within normal range. -Cardiac monitoring -Magnesium level pending -Avoid QT prolonging drugs -Repeat EKG in a.m.  ESRD on HD MWF Potassium and bicarb normal.  Does not appear volume overloaded.  No indication for urgent dialysis overnight. -Consult nephrology during daytime.  Chronic hypotension Blood pressure currently stable. -Continue home midodrine after pharmacy med rec is done.  Non-insulin-dependent type 2 diabetes A1c 6.5 on 05/17/2022. -Very sensitive sliding scale insulin ACHS  Paroxysmal A-fib Currently in sinus rhythm.  Not on chronic anticoagulation due to history of small bowel AVMs/GI bleed.  Chronic combined CHF Mild to moderate mitral valve regurgitation Echo done 06/07/2022 showing LVEF 45 to 16%, grade 1 diastolic dysfunction, mild to moderate mitral valve regurgitation.  Does not appear volume overloaded at this time. -Volume management with dialysis.  Dementia: Delirium precautions. Hypothyroidism Hyperlipidemia CAD: Not endorsing anginal symptoms. PVD -Pharmacy med rec pending.  DVT prophylaxis: SCDs Code Status: DNR.  Per review of MOST form in the chart and recent hospitalizations, he is DNR.  No family available at this time.  Please confirm with family in the morning. Level of care: Telemetry bed Admission status: It is my clinical opinion that referral for OBSERVATION is reasonable and necessary in this patient based on the above information provided. The aforementioned taken together are felt to place the patient at high risk for further clinical deterioration. However,  it is anticipated that the patient may be medically stable for discharge from the hospital within 24 to 48 hours.   Shela Leff MD [facility]  If 7PM-7AM, please contact night-coverage [email or web address]  08/18/2022, 2:48 AM

## 2022-08-18 NOTE — ED Notes (Signed)
Pt too agitated to tolerate EEG (otherwise calm when not being attached to machine); EEG tech communicates to this RN that the team will return around 8am when there are 2 techs available to assist each other for the procedure

## 2022-08-18 NOTE — ED Notes (Signed)
Patient is oriented to name only , speech is very gargled at times.

## 2022-08-18 NOTE — Progress Notes (Addendum)
Patient is extremely uncooperative. EEG Tech attempted EEG hook-up, but patient kept shaking his head, knocking wires off. He also kept batting EEG Tech's arm away. Patient may need a two tech hook-up to complete faster. If not, patient may require sedative to relax.

## 2022-08-18 NOTE — ED Notes (Signed)
Pt becoming agitated, states he is leaving but is unable to answer orientation questions. Attempted to verbally deescalate; however, patient became more agitated and started becoming verbally aggressive. Unable to start IV or get patient back into bed. Due to concern for safety, called patient wife who attempted to verbally deescalated. Wife agrees with staff that pt with require medication to calm down. Im ativan provided per EDP orders. Multiple staff assist patient to bed. He is not physically aggressive during this but does swat away staff who attempt to attach monitoring equipment. Will reattempt IV and monitoring when medication has had time to take effect.

## 2022-08-18 NOTE — Progress Notes (Signed)
Interim progress note not for billing.  Patient admitted a few hours ago by my colleague. 55m hx dementia and multiple other comorbidities documented in h and p, presenting with seizure-like activity. Chronic subdurals noted on CT of head. Does not appear to currently be seizing. Qtc noted to be elevated to 600 on arrival iimproved to 520 this morning. I have re-ordered home meds but avoided those with qtc prolonging potentiality and for that reason have also held off on ordering keppra for this patient. Neurology has been consulted and eeg is ordered. Will defer further neuroimaging to neurology. I have also notified nephrology who will plan to see patient on Monday for hemodialysis if he is still hospitalized then (gets [facility]wf hemodialysis and last dialyzed yesterday). Will attempt to reach family to update.

## 2022-08-18 NOTE — ED Provider Notes (Signed)
Accepted handoff at shift change from [place], PA-C. Please see prior provider note for more detail.   Briefly: Patient is 81 y.o. male with a PMH of Alzheimers, HTN, hypothyroidism, OSA, ESRD on dialysis, PVD, GERD, diabetes, CAD who presents to the ED with a chief complaint of tremors.   Family states around 530 he returned from dialysis.  He appeared sleepy.  Family fed him a snack.  Patient's wife and caregiver noted he started to have eyes rolling into the back of his head, twitching of his left hand and garbled speech.  Lasted for approximately 5 minutes or so.  He was normal back to baseline on EMS arrival however in transport he had said with full body tremors.  No meds with EMS prior to arrival.   She has no complaints on exam.  When asked why he is here he states he is not sure.  States he feels fatigued.  He denies any pain, shortness of breath.   EMS states full dialysis session today.   Discussed with wife via telephone.  No history of seizures.  He had no facial droop, unilateral deficits at home.  Family unsure of syncope however states "he was out of it" and could not answer questions.     Workup thus far reveals stable renal function, no significant electrolyte abnormalities, LFTs are stable, no leukocytosis, no worsening anemia, lipase 53, normal chest x ray, stable chronic SDH on head CT with no acute findings, No acute findings in CT a/p. EKG with NSR, elevated QT interval, no arrhythmias   Interventions: observation  DDX: concern for seizure versus syncope  Plan: Plan to follow up on CT a/p. If normal, plan to admit for seizure versus syncope.     Physical Exam  BP 100/60   Pulse 73   Temp 97.6 F (36.4 C) (Oral)   Resp 17   Ht '5\' 8"'$  (1.727 m)   Wt 64.2 kg   SpO2 99%   BMI 21.52 kg/m   Physical Exam Vitals and nursing note reviewed.  Constitutional:      General: He is not in acute distress.    Appearance: Normal appearance. He is well-developed. He is not  ill-appearing, toxic-appearing or diaphoretic.  HENT:     Head: Normocephalic and atraumatic.     Nose: No nasal deformity.     Mouth/Throat:     Lips: Pink. No lesions.  Eyes:     General: Gaze aligned appropriately. No scleral icterus.       Right eye: No discharge.        Left eye: No discharge.     Conjunctiva/sclera: Conjunctivae normal.     Right eye: Right conjunctiva is not injected. No exudate or hemorrhage.    Left eye: Left conjunctiva is not injected. No exudate or hemorrhage. Pulmonary:     Effort: Pulmonary effort is normal. No respiratory distress.  Skin:    General: Skin is warm and dry.  Neurological:     Mental Status: He is alert and oriented to person, place, and time.  Psychiatric:        Mood and Affect: Mood normal.        Speech: Speech normal.        Behavior: Behavior normal. Behavior is cooperative.     Procedures  Procedures  ED Course / MDM   Clinical Course as of 08/18/22 0301  Sat Aug 18, 2022  0132 I discussed case with [facility], [place] and plan to evaluate patient for  admission [GL]    Clinical Course User Index [GL] Sherre Poot Adora Fridge, PA-C   Medical Decision Making Amount and/or Complexity of Data Reviewed Labs: ordered. Radiology: ordered.  Risk Decision regarding hospitalization.   CT a/p was normal. I have reevaluated patient and history is difficult to obtain due to baseline abnormal mentation. Family not available to verify if this is his baseline, however per prior provider, patient did have worsening confusion after the "seizure like activity". With no history of seizures and multiple comorbidity I do agree with plan for admission at this time to evaluate for seizure.         Adolphus Birchwood, PA-C [id number]/59 [id number]    Delora Fuel, MD 75/64/33 [phone]

## 2022-08-18 NOTE — Progress Notes (Addendum)
EEG complete - results pending 

## 2022-08-18 NOTE — ED Notes (Addendum)
Pt more agreeable to treatment after ativan, IV started and placed on monitoring equipment. Currently in afib at rate of 133bpm. BP 83/73. Paged Dr. Marlowe Sax.

## 2022-08-19 DIAGNOSIS — R569 Unspecified convulsions: Secondary | ICD-10-CM | POA: Diagnosis not present

## 2022-08-19 LAB — BASIC METABOLIC PANEL
Anion gap: 16 — ABNORMAL HIGH (ref 5–15)
BUN: 39 mg/dL — ABNORMAL HIGH (ref 8–23)
CO2: 27 mmol/L (ref 22–32)
Calcium: 10.2 mg/dL (ref 8.9–10.3)
Chloride: 97 mmol/L — ABNORMAL LOW (ref 98–111)
Creatinine, Ser: 7.05 mg/dL — ABNORMAL HIGH (ref 0.61–1.24)
GFR, Estimated: 7 mL/min — ABNORMAL LOW (ref >60)
Glucose, Bld: 84 mg/dL (ref 70–99)
Potassium: 4.6 mmol/L (ref 3.5–5.1)
Sodium: 140 mmol/L (ref 135–145)

## 2022-08-19 LAB — CBG MONITORING, ED
Glucose-Capillary: 79 mg/dL (ref 70–99)
Glucose-Capillary: 82 mg/dL (ref 70–99)

## 2022-08-19 MED ORDER — LACOSAMIDE 50 MG PO TABS: 50.0000 mg | ORAL_TABLET | Freq: Two times a day (BID) | ORAL | 0 refills | Status: DC

## 2022-08-19 MED ORDER — QUETIAPINE FUMARATE 25 MG PO TABS: 50.0000 mg | ORAL_TABLET | Freq: Every day | ORAL | Status: DC

## 2022-08-19 MED ORDER — CHLORHEXIDINE GLUCONATE CLOTH 2 % EX PADS: 6.0000 | MEDICATED_PAD | Freq: Every day | CUTANEOUS | Status: DC

## 2022-08-19 MED ORDER — MELATONIN 5 MG PO TABS: 5.0000 mg | ORAL_TABLET | Freq: Every day | ORAL | Status: DC

## 2022-08-19 MED ORDER — LACOSAMIDE 100 MG PO TABS: 100.0000 mg | ORAL_TABLET | Freq: Two times a day (BID) | ORAL | 0 refills | Status: AC

## 2022-08-19 NOTE — ED Notes (Signed)
Pt attempting to get out of bed multiple times to  get up "to  leave and go get his keys from the street". Pt is very confused and only alert to self at this time. Pt educated and reoriented by not showing evidence of understanding.

## 2022-08-19 NOTE — ED Notes (Signed)
Assisted with breakfast

## 2022-08-19 NOTE — Discharge Instructions (Signed)
Follow with Primary MD Elby Showers, MD in 7 days   Get CBC, BMP-  checked next visit with your primary MD    Activity: As tolerated with Full fall precautions use walker/cane & assistance as needed  Disposition Home    Diet: Renal diet, low carbohydrate.  Strict 1.2 L fluid restriction per day.  Special Instructions: If you have smoked or chewed Tobacco  in the last 2 yrs please stop smoking, stop any regular Alcohol  and or any Recreational drug use.  On your next visit with your primary care physician please Get Medicines reviewed and adjusted.  Please request your Prim.MD to go over all Hospital Tests and Procedure/Radiological results at the follow up, please get all Hospital records sent to your Prim MD by signing hospital release before you go home.  If you experience worsening of your admission symptoms, develop shortness of breath, life threatening emergency, suicidal or homicidal thoughts you must seek medical attention immediately by calling 911 or calling your MD immediately  if symptoms less severe.  You Must read complete instructions/literature along with all the possible adverse reactions/side effects for all the Medicines you take and that have been prescribed to you. Take any new Medicines after you have completely understood and accpet all the possible adverse reactions/side effects.

## 2022-08-19 NOTE — ED Notes (Signed)
Toileting offered to the pt, pt denied needing to void.

## 2022-08-19 NOTE — ED Notes (Signed)
Pt removed self from monitor. Sitting on edge of bed. Remains confused. No changes. Returned to Biomedical scientist. Updated, re-oriented.

## 2022-08-19 NOTE — Progress Notes (Signed)
[facility] Observation Note  Nephrology is aware that patient is admitted to observation and may discharge today. Patient has no emergent indications for dialysis, denies SOB, CP, dizziness, abdominal pain and nausea. K+ 4.6. He has dementia at baseline. If he remains admitted tomorrow, will arrange for dialysis. If he discharges today, he can follow up with his outpatient dialysis unit tomorrow.  Anice Paganini, PA-C 08/19/2022, 8:59 AM  [facility] Pager: [phone]

## 2022-08-19 NOTE — Evaluation (Signed)
Physical Therapy Evaluation Patient Details Name: [name] MRN: [id number] DOB: 02/14/1941 Today's Date: 08/19/2022  History of Present Illness  [name] is a 81 y.o. male who presented after episode of eyes rolling into the back of his head, twitching of his left hand and garbled speech that lasted for approx. 5 minutes. Kept for observation 11/25 EEG negative for seizure activity  PMHx of Alzheimers, HTN, hypothyroidism, OSA, ESRD on dialysis, PVD, GERD, diabetes, CAD  Clinical Impression  Pt pleasantly confused and unable to provide reliable information about home set up  and his PLOF. Information gleaned from chart review of hospitalization in 06/23. Overall pt is minA +1-2 for bed mobility, transfers and ambulation. Pt obviously very familiar with his Rollator at home. Pt is likely at his baseline level of function. Pt will have no PT or equipment needs at discharge, however PT will continue to follow acutely to progress mobility.         Recommendations for follow up therapy are one component of a multi-disciplinary discharge planning process, led by the attending physician.  Recommendations may be updated based on patient status, additional functional criteria and insurance authorization.  Follow Up Recommendations No PT follow up      Assistance Recommended at Discharge Frequent or constant Supervision/Assistance  Patient can return home with the following  A lot of help with walking and/or transfers;A lot of help with bathing/dressing/bathroom;Assistance with cooking/housework;Direct supervision/assist for medications management;Direct supervision/assist for financial management;Assist for transportation;Help with stairs or ramp for entrance    Equipment Recommendations None recommended by PT     Functional Status Assessment Patient has not had a recent decline in their functional status     Precautions / Restrictions Precautions Precautions: Fall Precaution  Comments: comes to ED after fall/seizure Restrictions Weight Bearing Restrictions: No      Mobility  Bed Mobility Overal bed mobility: Needs Assistance Bed Mobility: Supine to Sit, Sit to Supine     Supine to sit: Min assist Sit to supine: Min assist        Transfers Overall transfer level: Needs assistance Equipment used: Rolling walker (2 wheels) Transfers: Sit to/from Stand Sit to Stand: Min assist, +2 safety/equipment, From elevated surface           General transfer comment: from stretcher    Ambulation/Gait Ambulation/Gait assistance: Min assist, Min guard, +2 safety/equipment Gait Distance (Feet): 300 Feet Assistive device: Rolling walker (2 wheels) Gait Pattern/deviations: Step-through pattern, Decreased step length - right, Decreased step length - left, Shuffle, Trunk flexed Gait velocity: slowed Gait velocity interpretation: 1.31 - 2.62 ft/sec, indicative of limited community ambulator   General Gait Details: pt obviously uses at Rollator at baseline, due to his flexed trunk and inability to increase proximity to RW, min A progressing to min guard, towards end of ambulation pt attempts to turn and sit as if he is using RW, needs reminding that this is not his Rollator, pt seems unphased         Balance Overall balance assessment: Mild deficits observed, not formally tested                                           Pertinent Vitals/Pain Pain Assessment Pain Assessment: No/denies pain    Home Living Family/patient expects to be discharged to:: Private residence Living Arrangements: Spouse/significant other Available Help at Discharge: Family;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 hours/day  Type of Home: House Home Access: Stairs to enter Entrance Stairs-Rails: Right;Left;Can reach both Entrance Stairs-Number of Steps: stair lift in the garage   Home Layout: Two level;Able to live on main level with bedroom/bathroom Home Equipment: Rolling Walker (2  wheels);Hospital bed;Transport chair;Wheelchair - manual;Grab bars - toilet Additional Comments: info gleaned from chart    Prior Function Prior Level of Function : Patient poor historian/Family not available             Mobility Comments: seemingly walks with rollator, pt reports no AD ADLs Comments: unable to report - per chart family assists as needed     Hand Dominance   Dominant Hand: Right    Extremity/Trunk Assessment   Upper Extremity Assessment Upper Extremity Assessment: Generalized weakness    Lower Extremity Assessment Lower Extremity Assessment: Generalized weakness    Cervical / Trunk Assessment Cervical / Trunk Assessment: Kyphotic  Communication   Communication: HOH  Cognition Arousal/Alertness: Awake/alert Behavior During Therapy: WFL for tasks assessed/performed Overall Cognitive Status: History of cognitive impairments - at baseline                                 General Comments: Dementia at baseline. Overall pt followed most functional commands. pleasantly confused throughout        General Comments General comments (skin integrity, edema, etc.): VSS on RA        Assessment/Plan    PT Assessment Patient does not need any further PT services  PT Problem List         PT Treatment Interventions      PT Goals (Current goals can be found in the Care Plan section)  Acute Rehab PT Goals PT Goal Formulation: Patient unable to participate in goal setting Time For Goal Achievement: 09/02/22 Potential to Achieve Goals: Fair         Co-evaluation PT/OT/SLP Co-Evaluation/Treatment: Yes Reason for Co-Treatment: Complexity of the patient's impairments (multi-system involvement);Necessary to address cognition/behavior during functional activity PT goals addressed during session: Mobility/safety with mobility OT goals addressed during session: ADL's and self-care       AM-PAC PT "6 Clicks" Mobility  Outcome Measure Help  needed turning from your back to your side while in a flat bed without using bedrails?: None Help needed moving from lying on your back to sitting on the side of a flat bed without using bedrails?: A Little Help needed moving to and from a bed to a chair (including a wheelchair)?: A Lot Help needed standing up from a chair using your arms (e.g., wheelchair or bedside chair)?: A Lot Help needed to walk in hospital room?: A Lot Help needed climbing 3-5 steps with a railing? : A Lot 6 Click Score: 15    End of Session Equipment Utilized During Treatment: Gait belt Activity Tolerance: Patient tolerated treatment well Patient left: in bed;with call bell/phone within reach;Other (comment) (rails up) Nurse Communication: Mobility status PT Visit Diagnosis: Muscle weakness (generalized) (M62.81);Unsteadiness on feet (R26.81)    Time: 3785-8850 PT Time Calculation (min) (ACUTE ONLY): 31 min   Charges:   PT Evaluation $PT Eval Moderate Complexity: 1 Mod          Chapin Arduini B. Migdalia Dk PT, DPT Acute Rehabilitation Services Please use secure chat or  Call Office [phone]   [place] 08/19/2022, 11:28 AM

## 2022-08-19 NOTE — ED Notes (Signed)
PT/OT with pt

## 2022-08-19 NOTE — Progress Notes (Signed)
Neurology Progress Note:   Interval History: RN report in epic, pt was very confused overnight, attempted to get up multiple times, insomnia and sundowning.  No family in pt room.  He was awake and watching TV.  Had eaten some of his breakfast.  Doing well.  Pleasantly confused.  Sensory is now symmetric on arms and legs.  PT and OT had gotten him up on his walker today.  Dialysis has been ordered to start tomorrow.  Exam: Vitals:   08/19/22 0739 08/19/22 0741  BP:    Pulse: 70 66  Resp: 17 11  Temp:    SpO2: 98% 99%   Gen: In bed, NAD Resp: non-labored breathing, no acute distress Cardiovascular: Normal rate and regular rhythm.  Abd: soft, nt Note dialysis old fistula in L upper arm, and current useable fistula in L upper arm  Neuro: Mental Status: Patient is awake, alert, oriented to person only, (baseline dementia) Patient is unable to give a clear and coherent history due to dementia No signs of aphasia or neglect Cranial Nerves: II: Visual Fields are full. Pupils are equal, round, and reactive to light.   III,IV, VI: EOMI without ptosis or diploplia.  V: Facial sensation is symmetric to temperature VII: Facial movement is symmetric resting and smiling VIII: Hearing is intact to voice X: Palate elevates symmetrically XI: Shoulder shrug is symmetric. XII: Tongue protrudes midline without atrophy or fasciculations.  Motor: Tone is normal. Bulk is normal. 5/5 strength was present in all four extremities.  Sensory: Sensation is symmetric to light touch and temperature in the arms and legs. No extinction to DSS present.  Deep Tendon Reflexes: 2+ and symmetric in the biceps and patellae.  Plantars: Toes are downgoing bilaterally.  Cerebellar: FNF and HKS are intact bilaterally  __________________________________________________________________  Pertinent Labs:  BMP:  Lab Results  Component Value Date   NA 140 08/19/2022   K 4.6 08/19/2022   CO2 27 08/19/2022    GLUCOSE 84 08/19/2022   BUN 39 (H) 08/19/2022   CREATININE 7.05 (H) 08/19/2022   CALCIUM 10.2 08/19/2022   GFRNONAA 7 (L) 08/19/2022   GFRAA 12 (L) 01/03/2021     Lab Results  Component Value Date   NA 140 08/19/2022   K 4.6 08/19/2022   CL 97 (L) 08/19/2022   CO2 27 08/19/2022   Liver Fx Panel:  Lab Results  Component Value Date   ALT 11 08/17/2022   AST 26 08/17/2022   ALKPHOS 136 (H) 08/17/2022   BILITOT 0.3 08/17/2022   WBC:  Lab Results  Component Value Date   WBC 7.1 08/17/2022   HGB 12.3 (L) 08/17/2022   HCT 35.9 (L) 08/17/2022   MCV 102.0 (H) 08/17/2022   PLT 174 08/17/2022   Lipid Panel:  Lab Results  Component Value Date   LDLCALC 30 05/17/2022   A1C:  Lab Results  Component Value Date   HGBA1C 6.5 (H) 05/17/2022   B12:  Lab Results  Component Value Date   VITAMINB12 463 10/02/2021   Folate:  Lab Results  Component Value Date   FOLATE 10.5 07/28/2020   Urine Drug Screen: No results found for: "LABOPIA", "COCAINSCRNUR", "LABBENZ", "AMPHETMU", "THCU", "LABBARB"  Alcohol Level No results found for: "ETH" elevated   CT Head w/o 08/17/22: chronic bil frontal SDH, stable from 04/2022 imaging   CT abdomen: no acute GI findings, Compression fractures of all of the lumbar vertebral bodies are similar and most marked about L1 where there is complete vertebral  body height loss. Vertebroplasty L3. Right hip ORIF.   MRI Brain 09/2021: Generalized brain atrophy. Chronic bilateral subdural collection without cerebral mass effect.   rEEG 08/18/22: mild-to-moderate diffuse slowing indicative of global cerebral dysfunction, no seizures noted  _______________________________________________  Impression:  Sounds like he is not managing well at home.  The presenting episode is very much consistent with a seizure for which he has multiple possible etiologies.  He will need long-term antiepileptic.  Possible metabolic cause as he had just completed  dialysis Chronic SDH vascular disease and dementia changes in the brain could have triggered new onset seizure some abd pain and elevated lipase. 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 MD to update note as needed.   Dellia Beckwith, [place] [facility] [phone] cell  To contact Stroke Continuity provider, please refer to [email or web address] After hours, contact General Neurology.If 7pm- 7am, please page neurology on call as listed in [place].  I have reviewed the note and agree with the above recommendations.  Issues are primarily social this point, from a seizure perspective, would continue lacosamide.  Neurology will be available on an as-needed basis.  Roland Rack, MD [facility] [phone]  If 7pm- 7am, please page neurology on call as listed in [place].

## 2022-08-19 NOTE — Discharge Summary (Signed)
[name]:[id number] DOB: [birth year] DOA: 08/17/2022  PCP: Elby Showers, MD  Admit date: 08/17/2022  Discharge date: 08/19/2022  Admitted From: Home   Disposition:  Home   Recommendations for Outpatient Follow-up:   Follow up with PCP in 1-2 weeks  PCP Please obtain BMP/CBC, 2 view CXR in 1week,  (see Discharge instructions)   PCP Please follow up on the following pending results: Needs one-time outpatient follow-up with neurology in the next 1 to 2 weeks for new onset seizure.   Home Health: PT, RN if qualifies   Equipment/Devices: None  Consultations: Neuro Discharge Condition: Stable    CODE STATUS: Full    Diet Recommendation: Renal-low carbohydrate diet with 1200 cc fluid restriction per day    Chief Complaint  Patient presents with   Tremors     Brief history of present illness from the day of admission and additional interim summary    81 y.o. male with PMH significant for  has a past medical history of Allergy, Alzheimer's disease ([place]) (01/19/2021), Anemia, Arthritis, Cataract, Coronary artery disease, Diabetes mellitus, Diverticulitis, ED (erectile dysfunction), Elevated homocysteine, ESRD (end stage renal disease) on dialysis ([place]) (03/2015), GERD (gastroesophageal reflux disease), Gout, Hiatal hernia, Hyperlipidemia, Hypertension, Hypothyroidism, PVD (peripheral vascular disease) ([place]), Renal insufficiency, and Sleep apnea. who presents with  tremors.     Family states around 530 he returned from dialysis.  He appeared sleepy.  Family fed him a snack.  Patient's wife and caregiver noted he started to have eyes rolling into the back of his head, twitching of his left hand and garbled speech.  Lasted for approximately 5 minutes or so.  He was normal back to baseline on EMS arrival however  in transport he had said with full body tremors.  No meds with EMS prior to arrival.  On arrival patient pleasantly confused, history of Alzheimer's.                                                                    Hospital Course   New onset seizure.  Seen by neurology.  Has gradually progressive dementia now moderate to advanced, also stable chronic subdural hematoma suggesting some falls at home in the past, underwent CT head and EEG which were both nonacute, was seen by neurology, placed on Vimpat, currently seizure-free at his baseline.  Plan discussed with patient's wife she would like for him to come home, will be discharged on Vimpat, he does not drive for the last several years.  Will get home PT and RN if he qualifies with close outpatient follow-up with PCP, requested to follow-up with neurology 1 time in the next 7 to 10 days.  ESRD.  On MWF schedule.  Continue dialysis outpatient from tomorrow as before.  History of Alzheimer's dementia.  Gradually progressive.  Supportive care to be continued at home.  May require SNF placement in the next few months if continues to decline.  QTc prolongation.  [name] to RVR, stable now QTc this morning 478 ms.  Paroxysmal atrial fibrillation.  Mali vas 2 score of 3.  Currently in rate control, not on any rate controlling agents at home, not on anticoagulation [name] to history of GI bleed and subdural hematomas.  Advanced dementia and high risk for fall.  Chronic hypotension.  Continue midodrine 3 times daily.  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  Last EF 45%.  Currently compensated.  Volume management through HD treatments.  Hypothyroidism.  On Synthroid.  GERD.  On PPI.  DM type II.  Continue home regimen.  Gout.  Continue allopurinol.  Discharge diagnosis     Principal Problem:   Seizure-like activity (HCC) Active Problems:   Non-insulin dependent type 2 diabetes mellitus (HCC)   Hyperlipidemia    Essential hypertension   CAD in native artery   Hypothyroidism   OSA (obstructive sleep apnea)   Dementia without behavioral disturbance (HCC)   Paroxysmal atrial fibrillation (HCC)   End-stage renal disease on hemodialysis (HCC)   (HFpEF) heart failure with preserved ejection fraction (HCC)   REM sleep behavior disorder    Discharge instructions    Discharge Instructions     Discharge instructions   Complete by: As directed    Follow with Primary MD Elby Showers, MD in 7 days   Get CBC, BMP-  checked next visit with your primary MD    Activity: As tolerated with Full fall precautions use walker/cane & assistance as needed  Disposition Home    Diet: Renal diet, low carbohydrate.  Strict 1.2 L fluid restriction per day.  Check CBGs q. [facility]  Special Instructions: If you have smoked or chewed Tobacco  in the last 2 yrs please stop smoking, stop any regular Alcohol  and or any Recreational drug use.  On your next visit with your primary care physician please Get Medicines reviewed and adjusted.  Please request your Prim.MD to go over all Hospital Tests and Procedure/Radiological results at the follow up, please get all Hospital records sent to your Prim MD by signing hospital release before you go home.  If you experience worsening of your admission symptoms, develop shortness of breath, life threatening emergency, suicidal or homicidal thoughts you must seek medical attention immediately by calling 911 or calling your MD immediately  if symptoms less severe.  You Must read complete instructions/literature along with all the possible adverse reactions/side effects for all the Medicines you take and that have been prescribed to you. Take any new Medicines after you have completely understood and accpet all the possible adverse reactions/side effects.   Increase activity slowly   Complete by: As directed        Discharge Medications   Allergies as of 08/19/2022        Reactions   Ambien [zolpidem Tartrate] Other (See Comments)   Hallucinations and "felt crazy"    Penicillins Hives, Rash   Has patient had a PCN reaction causing immediate rash, facial/tongue/throat swelling, SOB or lightheadedness with hypotension: Yes Has patient had a PCN reaction causing severe rash involving mucus membranes or skin necrosis: Yes Has patient had a PCN reaction that required hospitalization: No Has patient had a PCN reaction occurring within the last 10 years: No If all of the above answers are "NO", then  may proceed with Cephalosporin use.        Medication List     STOP taking these medications    Forteo 600 MCG/2.4ML Sopn Generic drug: Teriparatide (Recombinant)       TAKE these medications    acetaminophen 325 MG tablet Commonly known as: TYLENOL Take 1-2 tablets (325-650 mg total) by mouth every 4 (four) hours as needed for mild pain. What changed:  how much to take when to take this   albuterol 108 (90 Base) MCG/ACT inhaler Commonly known as: VENTOLIN HFA Inhale 1-2 puffs into the lungs daily as needed for wheezing or shortness of breath.   allopurinol 100 MG tablet Commonly known as: ZYLOPRIM Take 100 mg by mouth daily.   butalbital-acetaminophen-caffeine 50-325-40 MG tablet Commonly known as: FIORICET Take 1 tablet by mouth every 6 (six) hours as needed for headache.   camphor-menthol lotion Commonly known as: SARNA Apply 1 application topically 4 (four) times daily as needed for itching.   cetirizine 10 MG tablet Commonly known as: ZYRTEC Take 10 mg by mouth daily as needed for allergies.   cyanocobalamin 1000 MCG/ML injection Commonly known as: VITAMIN B12 Inject 1 mL (1,000 mcg total) into the muscle every 30 (thirty) days.   donepezil 10 MG tablet Commonly known as: ARICEPT Take 1 tablet (10 mg total) by mouth at bedtime.   doxercalciferol 4 MCG/2ML injection Commonly known as: HECTOROL Inject 2.5 mLs (5 mcg total) into  the vein every Monday, Wednesday, and Friday with hemodialysis.   feeding supplement (NEPRO CARB STEADY) Liqd Take 237 mLs by mouth in the morning.   fluticasone 50 MCG/ACT nasal spray Commonly known as: FLONASE Place 1 spray into both nostrils daily. What changed:  how much to take when to take this reasons to take this   FreeStyle Freedom Lite w/Device Kit Use to check blood sugar 2 times per day dx code E11.65   freestyle lancets Use as instructed to check blood sugar 2 times per day dx code E11.65   FREESTYLE LITE test strip Generic drug: glucose blood USE AS DIRECTED THREE TIMES DAILY   hydrocortisone 2.5 % cream Apply 1 application topically 3 (three) times daily as needed (skin irritation (legs & arms)).   lacosamide 50 MG Tabs tablet Commonly known as: VIMPAT Take 1 tablet (50 mg total) by mouth 2 (two) times daily for 5 days.   Lacosamide 100 MG Tabs Take 1 tablet (100 mg total) by mouth 2 (two) times daily. Start taking on: August 25, 2022   levothyroxine 50 MCG tablet Commonly known as: SYNTHROID Take 1 tablet (50 mcg total) by mouth daily. What changed: when to take this   memantine 10 MG tablet Commonly known as: NAMENDA Take 1 tablet (10 mg total) by mouth 2 (two) times daily.   midodrine 10 MG tablet Commonly known as: PROAMATINE Take one pill prior to hemodialysis on MWF What changed:  how much to take how to take this when to take this additional instructions   omeprazole 40 MG capsule Commonly known as: PRILOSEC Take 1 capsule (40 mg total) by mouth daily. What changed: when to take this   QUEtiapine 25 MG tablet Commonly known as: SEROquel Take 1 tablet (25 mg total) by mouth at bedtime.   repaglinide 0.5 MG tablet Commonly known as: PRANDIN TAKE 1 TABLET(0.5 MG) BY MOUTH TWICE DAILY BEFORE A MEAL What changed: See the new instructions.   rosuvastatin 10 MG tablet Commonly known as: CRESTOR Take 1 tablet (10  mg total) by mouth  daily.   sevelamer carbonate 800 MG tablet Commonly known as: RENVELA Take 1,600-2,400 mg by mouth See admin instructions. Take 2,400 mg by mouth three times a day with meals and 1,600 mg with snacks   SYRINGE 3CC/25GX1" 25G X 1" 3 ML Misc 1 each by Does not apply route every 30 (thirty) days.   Theratears 0.25 % Soln Generic drug: Carboxymethylcellulose Sodium Place 1 drop into both eyes 3 (three) times daily as needed (for dryness).   Tradjenta 5 MG Tabs tablet Generic drug: linagliptin Take 1 tablet (5 mg total) by mouth daily.         Follow-up Information     Baxley, Cresenciano Lick, MD. Schedule an appointment as soon as possible for a visit in 1 week(s).   Specialty: Internal Medicine Contact information: [address] [phone] [phone]         [place]. Schedule an appointment as soon as possible for a visit in 1 week(s).   Why: seizure Contact information: [address]     [place] [phone]                Major procedures and Radiology Reports - PLEASE review detailed and final reports thoroughly  -       EEG adult  Result Date: 08/18/2022 Derek Jack, MD     08/18/2022  6:42 PM Routine EEG Report [name] is a 81 y.o. male with a history of seizure-like episode who is undergoing an EEG to evaluate for seizures. Report: This EEG was acquired with electrodes placed according to the International 10-20 electrode system (including Fp1, Fp2, F3, F4, C3, C4, P3, P4, O1, O2, T3, T4, T5, T6, A1, A2, Fz, Cz, Pz). The following electrodes were missing or displaced: none. The occipital dominant rhythm was 6-7 Hz with overriding beta frequencies. This activity is reactive to stimulation. Drowsiness was manifested by background fragmentation; deeper stages of sleep were identified by K complexes and sleep spindles. There was no focal slowing. There were no interictal  epileptiform discharges. There were no electrographic seizures identified. Photic stimulation and hyperventilation were not performed. Impression and clinical correlation: This EEG was obtained while awake and asleep and is abnormal [name] to mild-to-moderate diffuse slowing indicative of global cerebral dysfunction. Epileptiform abnormalities were not seen during this recording. Su Monks, MD [facility] [phone] If 7pm- 7am, please page neurology on call as listed in [place].   CT ABDOMEN PELVIS WO CONTRAST  Result Date: 08/18/2022 CLINICAL DATA:  Pain and elevated lipase EXAM: CT ABDOMEN AND PELVIS WITHOUT CONTRAST TECHNIQUE: Multidetector CT imaging of the abdomen and pelvis was performed following the standard protocol without IV contrast. RADIATION DOSE REDUCTION: This exam was performed according to the departmental dose-optimization program which includes automated exposure control, adjustment of the mA and/or kV according to patient size and/or use of iterative reconstruction technique. COMPARISON:  CT abdomen and pelvis 06/14/2022 FINDINGS: Lower chest: Emphysema. Bibasilar atelectasis/scarring. No acute abnormality. Hepatobiliary: No suspicious focal liver abnormality is seen. Subcentimeter low-density lesions are unchanged and presumably cysts. No specific follow-up imaging recommended. No gallstones, gallbladder wall thickening, or biliary dilatation. Pancreas: Unremarkable. No pancreatic ductal dilatation or surrounding inflammatory changes. Spleen: Normal in size without focal abnormality. Adrenals/Urinary Tract: Unremarkable adrenal glands. Atrophic native kidneys. Unchanged low-attenuation lesions in the kidneys are statistically likely to represent cysts. No follow-up is required. Stomach/Bowel: Stomach is within normal limits. Appendix appears normal.  No evidence of bowel wall thickening, distention, or inflammatory changes. Vascular/Lymphatic: Advanced aortic atherosclerotic  calcification. No abdominopelvic lymphadenopathy. Prior aorto bi-iliac stent graft. Reproductive: Unremarkable. Other: No free intraperitoneal fluid or air. Postsurgical changes anterior abdominal wall, unchanged. Musculoskeletal: No interval change. Compression fractures of all of the lumbar vertebral bodies are similar and most marked about L1 where there is complete vertebral body height loss. Vertebroplasty L3. Right hip ORIF. IMPRESSION: No acute abnormality in the abdomen or pelvis. Multiple chronic findings detailed and findings and unchanged from 06/14/2022. Electronically Signed   By: Placido Sou M.D.   On: 08/18/2022 00:00   CT HEAD WO CONTRAST (5MM)  Result Date: 08/17/2022 CLINICAL DATA:  Twitching, possible seizure activity EXAM: CT HEAD WITHOUT CONTRAST TECHNIQUE: Contiguous axial images were obtained from the base of the skull through the vertex without intravenous contrast. RADIATION DOSE REDUCTION: This exam was performed according to the departmental dose-optimization program which includes automated exposure control, adjustment of the mA and/or kV according to patient size and/or use of iterative reconstruction technique. COMPARISON:  05/24/2022 FINDINGS: Brain: No acute hemorrhage, acute infarction or space-occupying mass lesion is noted. Chronic white matter ischemic changes and generalized atrophy are again noted. Bifrontal fluid collections are again identified stable in appearance from the prior CT examination left slightly greater than right consistent with chronic subdural hematomas. No acute component is noted. Vascular: No hyperdense vessel or unexpected calcification. Skull: Negative for fracture or focal lesion. Prior bilateral burr holes are again noted. Sinuses/Orbits: No acute finding. Other: None. 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 No acute component is noted. Electronically Signed   By: Inez Catalina M.D.   On:  08/17/2022 23:04   DG Chest 2 View  Result Date: 08/17/2022 CLINICAL DATA:  Generalized weakness EXAM: CHEST - 2 VIEW COMPARISON:  06/21/2022 FINDINGS: Cardiac shadow is prominent but stable. Aortic calcifications are seen. The lungs are well aerated bilaterally. No focal infiltrate or effusion noted. No bony abnormality is seen. IMPRESSION: No active cardiopulmonary disease. Electronically Signed   By: Inez Catalina M.D.   On: 08/17/2022 22:59    Today   Subjective    [name] today has no headache,no chest abdominal pain,no new weakness tingling or numbness, feels much better wants to go home today.     Objective   Blood pressure (!) 101/57, pulse 66, temperature (!) 97.5 F (36.4 C), temperature source Oral, resp. rate 11, height _0  (1.727 m), weight 64.2 kg, SpO2 99 %.  No intake or output data in the 24 hours ending 08/19/22 0901  Exam  Awake Alert, No new F.N deficits,    .AT,PERRAL Supple Neck,   Symmetrical Chest wall movement, Good air movement bilaterally, CTAB RRR,No Gallops,   +ve B.Sounds, Abd Soft, Non tender,  No Cyanosis, Clubbing or edema    Data Review   Recent Labs  Lab 08/17/22 1952  WBC 7.1  HGB 12.3*  HCT 35.9*  PLT 174  MCV 102.0*  MCH 34.9*  MCHC 34.3  RDW 16.2*  LYMPHSABS 1.7  MONOABS 0.7  EOSABS 0.2  BASOSABS 0.1    Recent Labs  Lab 08/17/22 1952 08/19/22 0531  NA 140 140  K 3.9 4.6  MG 2.3  --   CL 94* 97*  CO2 28 27  GLUCOSE 120* 84  BUN 19 39*  CREATININE 4.34* 7.05*  CALCIUM 9.6 10.2  AST 26  --   ALT 11  --  ALKPHOS 136*  --   BILITOT 0.3  --   ALBUMIN 3.3*  --     Total Time in preparing paper work, data evaluation and todays exam - 34 minutes  Lala Lund M.D on 08/19/2022 at 9:01 AM  [facility]

## 2022-08-19 NOTE — Evaluation (Signed)
Occupational Therapy Evaluation Patient Details Name: [name] MRN: [id number] DOB: [birth year] Today's Date: 08/19/2022   History of Present Illness [name] is a 81 y.o. male who presented after episode of eyes rolling into the back of his head, twitching of his left hand and garbled speech that lasted for approx. 5 minutes. Kept for observation 11/25 EEG negative for seizure activity  PMHx of Alzheimers, HTN, hypothyroidism, OSA, ESRD on dialysis, PVD, GERD, diabetes, CAD   Clinical Impression   [name] was evaluated s/p the above admission list. Pt has dementia hx and is an unreliable historian, PLOF and home set up gleaned fm chart. Upon evaluation pt was pleasantly confused throughout and followed all functional commands with cues for attention and safety. Overall he required min A+1-2 for safety and cues. Pt is likely near his functional baseline. OT to continue to follow acutely. No OT needs at d/c, recommend home with support of family.    Recommendations for follow up therapy are one component of a multi-disciplinary discharge planning process, led by the attending physician.  Recommendations may be updated based on patient status, additional functional criteria and insurance authorization.   Follow Up Recommendations  No OT follow up     Assistance Recommended at Discharge Frequent or constant Supervision/Assistance  Patient can return home with the following A little help with walking and/or transfers;A little help with bathing/dressing/bathroom;Assistance with cooking/housework;Direct supervision/assist for medications management;Direct supervision/assist for financial management;Assist for transportation;Help with stairs or ramp for entrance    Functional Status Assessment  Patient has had a recent decline in their functional status and demonstrates the ability to make significant improvements in function in a reasonable and predictable amount of time.  Equipment  Recommendations  None recommended by OT    Recommendations for Other Services       Precautions / Restrictions Precautions Precautions: Fall Precaution Comments: comes to ED after fall/seizure Restrictions Weight Bearing Restrictions: No      Mobility Bed Mobility Overal bed mobility: Needs Assistance Bed Mobility: Supine to Sit, Sit to Supine     Supine to sit: Min assist Sit to supine: Min assist        Transfers Overall transfer level: Needs assistance Equipment used: Rolling walker (2 wheels) Transfers: Sit to/from Stand Sit to Stand: Min assist, +2 safety/equipment, From elevated surface           General transfer comment: from stretcher      Balance Overall balance assessment: Mild deficits observed, not formally tested                                         ADL either performed or assessed with clinical judgement   ADL Overall ADL's : Needs assistance/impaired Eating/Feeding: Independent;Sitting   Grooming: Set up;Sitting   Upper Body Bathing: Minimal assistance;Sitting   Lower Body Bathing: Minimal assistance;Sit to/from stand   Upper Body Dressing : Minimal assistance;Sitting   Lower Body Dressing: Minimal assistance;Sit to/from stand   Toilet Transfer: Minimal assistance;Ambulation;Rolling walker (2 wheels)   Toileting- Clothing Manipulation and Hygiene: Minimal assistance;Sit to/from stand       Functional mobility during ADLs: Minimal assistance;Rolling walker (2 wheels) General ADL Comments: min A for cognitive assist, safety and steadying balance     Vision Baseline Vision/History: 0 No visual deficits Vision Assessment?: No apparent visual deficits     Perception Perception Perception Tested?: No  Praxis Praxis Praxis tested?: Not tested    Pertinent Vitals/Pain Pain Assessment Pain Assessment: No/denies pain     Hand Dominance Right   Extremity/Trunk Assessment Upper Extremity Assessment Upper  Extremity Assessment: Generalized weakness   Lower Extremity Assessment Lower Extremity Assessment: Generalized weakness   Cervical / Trunk Assessment Cervical / Trunk Assessment: Kyphotic   Communication Communication Communication: HOH   Cognition Arousal/Alertness: Awake/alert Behavior During Therapy: WFL for tasks assessed/performed Overall Cognitive Status: History of cognitive impairments - at baseline                                 General Comments: Dementia at baseline. Overall pt followed most functional commands. pleasantly confused throughout     General Comments  VSS on RA.    Exercises     Shoulder Instructions      Home Living Family/patient expects to be discharged to:: Private residence Living Arrangements: Spouse/significant other Available Help at Discharge: Family;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 hours/day Type of Home: House Home Access: Stairs to enter [employer] of Steps: stair lift in the garage Entrance Stairs-Rails: Right;Left;Can reach both Home Layout: Two level;Able to live on main level with bedroom/bathroom     Bathroom Shower/Tub: Tub/shower unit;Sponge bathes at baseline   Bathroom Toilet: Handicapped height Bathroom Accessibility: Yes How Accessible: Accessible via walker Home Equipment: [place] (2 wheels);Hospital bed;Transport chair;Wheelchair - manual;Grab bars - toilet   Additional Comments: info gleaned from chart      Prior Functioning/Environment Prior Level of Function : Patient poor historian/Family not available             Mobility Comments: seemingly walks with rollator, pt reports no AD ADLs Comments: unable to report - per chart family assists as needed        OT Problem List: Decreased activity tolerance;Impaired balance (sitting and/or standing);Decreased cognition;Decreased safety awareness      OT Treatment/Interventions: Self-care/ADL training;Therapeutic exercise;DME and/or AE  instruction;Therapeutic activities;Patient/family education;Balance training;Cognitive remediation/compensation    OT Goals(Current goals can be found in the care plan section) Acute Rehab OT Goals Patient Stated Goal: did not state OT Goal Formulation: With patient Time For Goal Achievement: 09/02/22 Potential to Achieve Goals: Good ADL Goals Additional ADL Goal #1: pt will complete BADLs with cues for cognition only  OT Frequency: Min 2X/week    Co-evaluation PT/OT/SLP Co-Evaluation/Treatment: Yes Reason for Co-Treatment: Complexity of the patient's impairments (multi-system involvement);To address functional/ADL transfers;For patient/therapist safety   OT goals addressed during session: ADL's and self-care      AM-PAC OT "6 Clicks" Daily Activity     Outcome Measure Help from another person eating meals?: None Help from another person taking care of personal grooming?: A Little Help from another person toileting, which includes using toliet, bedpan, or urinal?: A Little Help from another person bathing (including washing, rinsing, drying)?: A Little Help from another person to put on and taking off regular upper body clothing?: A Little Help from another person to put on and taking off regular lower body clothing?: A Little 6 Click Score: 19   End of Session Equipment Utilized During Treatment: Gait belt;Rolling walker (2 wheels) Nurse Communication: Mobility status  Activity Tolerance: Patient tolerated treatment well Patient left: in chair;with call bell/phone within reach  OT Visit Diagnosis: History of falling (Z91.81);Muscle weakness (generalized) (M62.81)                Time: 3825-0539 OT Time Calculation (min): 31  min Charges:  OT General Charges $OT Visit: 1 Visit OT Evaluation $OT Eval Moderate Complexity: 1 Mod    [place] Klee D Causey 08/19/2022, 10:19 AM

## 2022-08-19 NOTE — ED Notes (Signed)
Mildly restless, asking to get up, and walk. Request denied with rationale. Lunch delivered.

## 2022-08-19 NOTE — ED Notes (Signed)
D/c order noticed in orders. Confirmed OK and ready to d/c. Wife contacted and on the way. Wife arrival imminent. AVS printed.

## 2022-08-19 NOTE — ED Notes (Signed)
Out in w/c to car by EMT, with wife. D/c'd to wife.

## 2022-08-19 NOTE — ED Notes (Signed)
Ambulating in h/w with walker with PT/OT assist.

## 2022-08-19 NOTE — ED Notes (Signed)
Pt alert, NAD, calm, interactive, resps e/u, speaking clearly, denies pain, nausea, sob or other sx, questions or complaints. EKG in progress. VSS.

## 2022-08-20 ENCOUNTER — Encounter (HOSPITAL_COMMUNITY): Payer: Self-pay

## 2022-08-20 DIAGNOSIS — D631 Anemia in chronic kidney disease: Secondary | ICD-10-CM | POA: Diagnosis not present

## 2022-08-20 DIAGNOSIS — N186 End stage renal disease: Secondary | ICD-10-CM | POA: Diagnosis not present

## 2022-08-20 DIAGNOSIS — Z992 Dependence on renal dialysis: Secondary | ICD-10-CM | POA: Diagnosis not present

## 2022-08-20 DIAGNOSIS — E876 Hypokalemia: Secondary | ICD-10-CM | POA: Diagnosis not present

## 2022-08-20 DIAGNOSIS — N2581 Secondary hyperparathyroidism of renal origin: Secondary | ICD-10-CM | POA: Diagnosis not present

## 2022-08-20 DIAGNOSIS — E1129 Type 2 diabetes mellitus with other diabetic kidney complication: Secondary | ICD-10-CM | POA: Diagnosis not present

## 2022-08-21 ENCOUNTER — Telehealth: Payer: Self-pay

## 2022-08-21 NOTE — Telephone Encounter (Signed)
Transition Care Management Follow-up Telephone Call Date of discharge and from where: 08/19/22 [facility] How have you been since you were released from the hospital? no Any questions or concerns? Yes   Items Reviewed: Did the pt receive and understand the discharge instructions provided? Yes  Medications obtained and verified? Yes  Other? No  Any new allergies since your discharge? No  Dietary orders reviewed? yes Do you have support at home? Yes   Home Care and Equipment/Supplies: Were home health services ordered? no If so, what is the name of the agency? N/A  Has the agency set up a time to come to the patient's home? no Were any new equipment or medical supplies ordered?  No What is the name of the medical supply agency? N/A Were you able to get the supplies/equipment? not applicable Do you have any questions related to the use of the equipment or supplies? N/A  Functional Questionnaire: (I = Independent and D = Dependent) ADLs: D  Bathing/Dressing- D  Meal Prep- D & I  Eating- D  Maintaining continence- I & D  Transferring/Ambulation- I  Managing Meds- D  Follow up appointments reviewed:  PCP Hospital f/u appt confirmed?  TBD  Specialist Hospital f/u appt confirmed? TBD Are transportation arrangements needed? No  If their condition worsens, is the pt aware to call PCP or go to the Emergency Dept.? Yes Was the patient provided with contact information for the PCP's office or ED? Yes Was to pt encouraged to call back with questions or concerns? Yes

## 2022-08-21 NOTE — Telephone Encounter (Signed)
PC SW returned patients spouse TC to [facility]   Spouse shared that she was looking for guidance and feedback from [facility] in regard to patient requiring more assistance in the home. Spouse shared that over the past few weeks patient has been experiencing disease progression. Spouse inquired about a hospital bed and private care giver services for HS. Currently patient has Aid & attendance through the [place].  SW advised spouse that patient does not meet qualification s for new palliative care program, as patient does not have a cancer dx nor is patient ready/appropriate for hospice services due to patient actively being a dialysis patient. SW advised [facility] in [facility] to increasing [facility] and attendance hours as well as hospital bed.

## 2022-08-22 DIAGNOSIS — Z992 Dependence on renal dialysis: Secondary | ICD-10-CM | POA: Diagnosis not present

## 2022-08-22 DIAGNOSIS — D631 Anemia in chronic kidney disease: Secondary | ICD-10-CM | POA: Diagnosis not present

## 2022-08-22 DIAGNOSIS — E876 Hypokalemia: Secondary | ICD-10-CM | POA: Diagnosis not present

## 2022-08-22 DIAGNOSIS — E1129 Type 2 diabetes mellitus with other diabetic kidney complication: Secondary | ICD-10-CM | POA: Diagnosis not present

## 2022-08-22 DIAGNOSIS — N2581 Secondary hyperparathyroidism of renal origin: Secondary | ICD-10-CM | POA: Diagnosis not present

## 2022-08-22 DIAGNOSIS — N186 End stage renal disease: Secondary | ICD-10-CM | POA: Diagnosis not present

## 2022-08-23 ENCOUNTER — Other Ambulatory Visit: Payer: Self-pay

## 2022-08-23 DIAGNOSIS — D500 Iron deficiency anemia secondary to blood loss (chronic): Secondary | ICD-10-CM

## 2022-08-23 DIAGNOSIS — R410 Disorientation, unspecified: Secondary | ICD-10-CM

## 2022-08-23 DIAGNOSIS — W19XXXS Unspecified fall, sequela: Secondary | ICD-10-CM

## 2022-08-23 DIAGNOSIS — F0390 Unspecified dementia without behavioral disturbance: Secondary | ICD-10-CM

## 2022-08-23 DIAGNOSIS — R569 Unspecified convulsions: Secondary | ICD-10-CM

## 2022-08-24 DIAGNOSIS — Z992 Dependence on renal dialysis: Secondary | ICD-10-CM | POA: Diagnosis not present

## 2022-08-24 DIAGNOSIS — N2581 Secondary hyperparathyroidism of renal origin: Secondary | ICD-10-CM | POA: Diagnosis not present

## 2022-08-24 DIAGNOSIS — E876 Hypokalemia: Secondary | ICD-10-CM | POA: Diagnosis not present

## 2022-08-24 DIAGNOSIS — E1129 Type 2 diabetes mellitus with other diabetic kidney complication: Secondary | ICD-10-CM | POA: Diagnosis not present

## 2022-08-24 DIAGNOSIS — T782XXA Anaphylactic shock, unspecified, initial encounter: Secondary | ICD-10-CM | POA: Diagnosis not present

## 2022-08-24 DIAGNOSIS — D631 Anemia in chronic kidney disease: Secondary | ICD-10-CM | POA: Diagnosis not present

## 2022-08-24 DIAGNOSIS — N186 End stage renal disease: Secondary | ICD-10-CM | POA: Diagnosis not present

## 2022-08-24 DIAGNOSIS — E1122 Type 2 diabetes mellitus with diabetic chronic kidney disease: Secondary | ICD-10-CM | POA: Diagnosis not present

## 2022-08-25 ENCOUNTER — Emergency Department (HOSPITAL_COMMUNITY): Payer: Medicare Other

## 2022-08-25 ENCOUNTER — Other Ambulatory Visit: Payer: Self-pay

## 2022-08-25 ENCOUNTER — Inpatient Hospital Stay (HOSPITAL_COMMUNITY)
Admission: EM | Admit: 2022-08-25 | Discharge: 2022-08-30 | DRG: 480 | Disposition: A | Discharge status: Skilled Nursing Facility | Payer: Medicare Other | Attending: Family Medicine | Admitting: Family Medicine

## 2022-08-25 ENCOUNTER — Encounter (HOSPITAL_COMMUNITY): Payer: Self-pay

## 2022-08-25 DIAGNOSIS — I120 Hypertensive chronic kidney disease with stage 5 chronic kidney disease or end stage renal disease: Secondary | ICD-10-CM | POA: Diagnosis not present

## 2022-08-25 DIAGNOSIS — Z880 Allergy status to penicillin: Secondary | ICD-10-CM | POA: Diagnosis not present

## 2022-08-25 DIAGNOSIS — E1169 Type 2 diabetes mellitus with other specified complication: Secondary | ICD-10-CM | POA: Diagnosis present

## 2022-08-25 DIAGNOSIS — S72145S Nondisplaced intertrochanteric fracture of left femur, sequela: Secondary | ICD-10-CM | POA: Diagnosis not present

## 2022-08-25 DIAGNOSIS — K219 Gastro-esophageal reflux disease without esophagitis: Secondary | ICD-10-CM | POA: Diagnosis present

## 2022-08-25 DIAGNOSIS — R262 Difficulty in walking, not elsewhere classified: Secondary | ICD-10-CM | POA: Diagnosis not present

## 2022-08-25 DIAGNOSIS — E039 Hypothyroidism, unspecified: Secondary | ICD-10-CM | POA: Diagnosis present

## 2022-08-25 DIAGNOSIS — R9431 Abnormal electrocardiogram [ECG] [EKG]: Secondary | ICD-10-CM | POA: Diagnosis not present

## 2022-08-25 DIAGNOSIS — E119 Type 2 diabetes mellitus without complications: Secondary | ICD-10-CM | POA: Diagnosis not present

## 2022-08-25 DIAGNOSIS — M80052A Age-related osteoporosis with current pathological fracture, left femur, initial encounter for fracture: Principal | ICD-10-CM | POA: Diagnosis present

## 2022-08-25 DIAGNOSIS — N186 End stage renal disease: Secondary | ICD-10-CM | POA: Diagnosis not present

## 2022-08-25 DIAGNOSIS — G4730 Sleep apnea, unspecified: Secondary | ICD-10-CM | POA: Diagnosis not present

## 2022-08-25 DIAGNOSIS — Z87891 Personal history of nicotine dependence: Secondary | ICD-10-CM

## 2022-08-25 DIAGNOSIS — D638 Anemia in other chronic diseases classified elsewhere: Secondary | ICD-10-CM | POA: Diagnosis not present

## 2022-08-25 DIAGNOSIS — F0390 Unspecified dementia without behavioral disturbance: Secondary | ICD-10-CM | POA: Diagnosis not present

## 2022-08-25 DIAGNOSIS — Z8781 Personal history of (healed) traumatic fracture: Secondary | ICD-10-CM | POA: Diagnosis not present

## 2022-08-25 DIAGNOSIS — R0689 Other abnormalities of breathing: Secondary | ICD-10-CM | POA: Diagnosis not present

## 2022-08-25 DIAGNOSIS — W07XXXA Fall from chair, initial encounter: Secondary | ICD-10-CM | POA: Diagnosis present

## 2022-08-25 DIAGNOSIS — S79912A Unspecified injury of left hip, initial encounter: Secondary | ICD-10-CM | POA: Diagnosis not present

## 2022-08-25 DIAGNOSIS — Z833 Family history of diabetes mellitus: Secondary | ICD-10-CM

## 2022-08-25 DIAGNOSIS — G309 Alzheimer's disease, unspecified: Secondary | ICD-10-CM | POA: Diagnosis present

## 2022-08-25 DIAGNOSIS — R2681 Unsteadiness on feet: Secondary | ICD-10-CM | POA: Diagnosis not present

## 2022-08-25 DIAGNOSIS — E1151 Type 2 diabetes mellitus with diabetic peripheral angiopathy without gangrene: Secondary | ICD-10-CM | POA: Diagnosis not present

## 2022-08-25 DIAGNOSIS — Z8249 Family history of ischemic heart disease and other diseases of the circulatory system: Secondary | ICD-10-CM

## 2022-08-25 DIAGNOSIS — D62 Acute posthemorrhagic anemia: Secondary | ICD-10-CM | POA: Diagnosis not present

## 2022-08-25 DIAGNOSIS — S72002A Fracture of unspecified part of neck of left femur, initial encounter for closed fracture: Secondary | ICD-10-CM | POA: Diagnosis not present

## 2022-08-25 DIAGNOSIS — Z471 Aftercare following joint replacement surgery: Secondary | ICD-10-CM | POA: Diagnosis not present

## 2022-08-25 DIAGNOSIS — Z79890 Hormone replacement therapy (postmenopausal): Secondary | ICD-10-CM

## 2022-08-25 DIAGNOSIS — E782 Mixed hyperlipidemia: Secondary | ICD-10-CM | POA: Diagnosis not present

## 2022-08-25 DIAGNOSIS — E1122 Type 2 diabetes mellitus with diabetic chronic kidney disease: Secondary | ICD-10-CM | POA: Diagnosis not present

## 2022-08-25 DIAGNOSIS — M6281 Muscle weakness (generalized): Secondary | ICD-10-CM | POA: Diagnosis not present

## 2022-08-25 DIAGNOSIS — F0283 Dementia in other diseases classified elsewhere, unspecified severity, with mood disturbance: Secondary | ICD-10-CM | POA: Diagnosis not present

## 2022-08-25 DIAGNOSIS — I480 Paroxysmal atrial fibrillation: Secondary | ICD-10-CM | POA: Diagnosis present

## 2022-08-25 DIAGNOSIS — Z7984 Long term (current) use of oral hypoglycemic drugs: Secondary | ICD-10-CM

## 2022-08-25 DIAGNOSIS — D631 Anemia in chronic kidney disease: Secondary | ICD-10-CM | POA: Diagnosis not present

## 2022-08-25 DIAGNOSIS — Z1152 Encounter for screening for COVID-19: Secondary | ICD-10-CM

## 2022-08-25 DIAGNOSIS — S72002D Fracture of unspecified part of neck of left femur, subsequent encounter for closed fracture with routine healing: Secondary | ICD-10-CM | POA: Diagnosis not present

## 2022-08-25 DIAGNOSIS — M25552 Pain in left hip: Secondary | ICD-10-CM | POA: Diagnosis not present

## 2022-08-25 DIAGNOSIS — R531 Weakness: Secondary | ICD-10-CM | POA: Diagnosis not present

## 2022-08-25 DIAGNOSIS — I2510 Atherosclerotic heart disease of native coronary artery without angina pectoris: Secondary | ICD-10-CM | POA: Diagnosis present

## 2022-08-25 DIAGNOSIS — Z888 Allergy status to other drugs, medicaments and biological substances status: Secondary | ICD-10-CM

## 2022-08-25 DIAGNOSIS — I10 Essential (primary) hypertension: Secondary | ICD-10-CM | POA: Diagnosis present

## 2022-08-25 DIAGNOSIS — W19XXXA Unspecified fall, initial encounter: Secondary | ICD-10-CM | POA: Diagnosis not present

## 2022-08-25 DIAGNOSIS — N250 Renal osteodystrophy: Secondary | ICD-10-CM | POA: Diagnosis not present

## 2022-08-25 DIAGNOSIS — Z992 Dependence on renal dialysis: Secondary | ICD-10-CM | POA: Diagnosis not present

## 2022-08-25 DIAGNOSIS — E440 Moderate protein-calorie malnutrition: Secondary | ICD-10-CM | POA: Diagnosis not present

## 2022-08-25 DIAGNOSIS — Z96642 Presence of left artificial hip joint: Secondary | ICD-10-CM | POA: Diagnosis not present

## 2022-08-25 DIAGNOSIS — E875 Hyperkalemia: Secondary | ICD-10-CM

## 2022-08-25 DIAGNOSIS — S72142A Displaced intertrochanteric fracture of left femur, initial encounter for closed fracture: Secondary | ICD-10-CM | POA: Diagnosis not present

## 2022-08-25 DIAGNOSIS — Z79899 Other long term (current) drug therapy: Secondary | ICD-10-CM

## 2022-08-25 DIAGNOSIS — E785 Hyperlipidemia, unspecified: Secondary | ICD-10-CM | POA: Diagnosis present

## 2022-08-25 DIAGNOSIS — Z043 Encounter for examination and observation following other accident: Secondary | ICD-10-CM | POA: Diagnosis not present

## 2022-08-25 DIAGNOSIS — Z4789 Encounter for other orthopedic aftercare: Secondary | ICD-10-CM | POA: Diagnosis not present

## 2022-08-25 DIAGNOSIS — Z7401 Bed confinement status: Secondary | ICD-10-CM | POA: Diagnosis not present

## 2022-08-25 DIAGNOSIS — M48061 Spinal stenosis, lumbar region without neurogenic claudication: Secondary | ICD-10-CM | POA: Diagnosis not present

## 2022-08-25 DIAGNOSIS — I132 Hypertensive heart and chronic kidney disease with heart failure and with stage 5 chronic kidney disease, or end stage renal disease: Secondary | ICD-10-CM | POA: Diagnosis not present

## 2022-08-25 DIAGNOSIS — I739 Peripheral vascular disease, unspecified: Secondary | ICD-10-CM | POA: Diagnosis not present

## 2022-08-25 DIAGNOSIS — R41841 Cognitive communication deficit: Secondary | ICD-10-CM | POA: Diagnosis not present

## 2022-08-25 DIAGNOSIS — I700 Atherosclerosis of aorta: Secondary | ICD-10-CM | POA: Diagnosis not present

## 2022-08-25 DIAGNOSIS — I509 Heart failure, unspecified: Secondary | ICD-10-CM | POA: Diagnosis not present

## 2022-08-25 LAB — CBC
HCT: 34 % — ABNORMAL LOW (ref 39.0–52.0)
Hemoglobin: 11.3 g/dL — ABNORMAL LOW (ref 13.0–17.0)
MCH: 35.1 pg — ABNORMAL HIGH (ref 26.0–34.0)
MCHC: 33.2 g/dL (ref 30.0–36.0)
MCV: 105.6 fL — ABNORMAL HIGH (ref 80.0–100.0)
Platelets: 146 10*3/uL — ABNORMAL LOW (ref 150–400)
RBC: 3.22 MIL/uL — ABNORMAL LOW (ref 4.22–5.81)
RDW: 16 % — ABNORMAL HIGH (ref 11.5–15.5)
WBC: 8.2 10*3/uL (ref 4.0–10.5)
nRBC: 0 % (ref 0.0–0.2)

## 2022-08-25 LAB — BASIC METABOLIC PANEL
Anion gap: 14 (ref 5–15)
BUN: 28 mg/dL — ABNORMAL HIGH (ref 8–23)
CO2: 24 mmol/L (ref 22–32)
Calcium: 10 mg/dL (ref 8.9–10.3)
Chloride: 96 mmol/L — ABNORMAL LOW (ref 98–111)
Creatinine, Ser: 5.54 mg/dL — ABNORMAL HIGH (ref 0.61–1.24)
GFR, Estimated: 10 mL/min — ABNORMAL LOW (ref >60)
Glucose, Bld: 86 mg/dL (ref 70–99)
Potassium: 5.3 mmol/L — ABNORMAL HIGH (ref 3.5–5.1)
Sodium: 134 mmol/L — ABNORMAL LOW (ref 135–145)

## 2022-08-25 MED ORDER — HYDROMORPHONE HCL 1 MG/ML IJ SOLN
0.5000 mg | INTRAMUSCULAR | Status: DC | PRN
  Administered 2022-08-26 – 2022-08-30 (×5): 0.5 mg via INTRAVENOUS
  Filled 2022-08-25: qty 0.5
  Filled 2022-08-25 (×2): qty 1
  Filled 2022-08-25 (×2): qty 0.5

## 2022-08-25 MED ORDER — POLYETHYLENE GLYCOL 3350 17 G PO PACK: 17.0000 g | PACK | Freq: Every day | ORAL | Status: DC | PRN

## 2022-08-25 MED ORDER — ROSUVASTATIN CALCIUM 5 MG PO TABS
10.0000 mg | ORAL_TABLET | Freq: Every day | ORAL | Status: DC
  Administered 2022-08-27 – 2022-08-30 (×4): 10 mg via ORAL
  Filled 2022-08-25 (×4): qty 2

## 2022-08-25 MED ORDER — ACETAMINOPHEN 325 MG PO TABS
650.0000 mg | ORAL_TABLET | Freq: Four times a day (QID) | ORAL | Status: DC | PRN
  Filled 2022-08-25: qty 2

## 2022-08-25 MED ORDER — FENTANYL CITRATE PF 50 MCG/ML IJ SOSY
50.0000 ug | PREFILLED_SYRINGE | INTRAMUSCULAR | Status: AC | PRN
  Administered 2022-08-25 – 2022-08-26 (×2): 50 ug via INTRAVENOUS
  Filled 2022-08-25 (×2): qty 1

## 2022-08-25 MED ORDER — MELATONIN 5 MG PO TABS: 5.0000 mg | ORAL_TABLET | Freq: Every evening | ORAL | Status: DC | PRN

## 2022-08-25 MED ORDER — QUETIAPINE FUMARATE 25 MG PO TABS
25.0000 mg | ORAL_TABLET | Freq: Every day | ORAL | Status: DC
  Administered 2022-08-26 – 2022-08-29 (×4): 25 mg via ORAL
  Filled 2022-08-25 (×4): qty 1

## 2022-08-25 MED ORDER — SENNOSIDES-DOCUSATE SODIUM 8.6-50 MG PO TABS
1.0000 | ORAL_TABLET | Freq: Every day | ORAL | Status: DC
  Administered 2022-08-26 – 2022-08-29 (×5): 1 via ORAL
  Filled 2022-08-25 (×5): qty 1

## 2022-08-25 MED ORDER — BUTALBITAL-APAP-CAFFEINE 50-325-40 MG PO TABS: 1.0000 | ORAL_TABLET | Freq: Four times a day (QID) | ORAL | Status: DC | PRN

## 2022-08-25 MED ORDER — LEVOTHYROXINE SODIUM 50 MCG PO TABS
50.0000 ug | ORAL_TABLET | Freq: Every day | ORAL | Status: DC
  Administered 2022-08-27 – 2022-08-29 (×3): 50 ug via ORAL
  Filled 2022-08-25 (×4): qty 1

## 2022-08-25 MED ORDER — PROCHLORPERAZINE EDISYLATE 10 MG/2ML IJ SOLN: 5.0000 mg | Freq: Four times a day (QID) | INTRAMUSCULAR | Status: DC | PRN

## 2022-08-25 MED ORDER — LACOSAMIDE 50 MG PO TABS
100.0000 mg | ORAL_TABLET | Freq: Two times a day (BID) | ORAL | Status: DC
  Administered 2022-08-26 – 2022-08-30 (×9): 100 mg via ORAL
  Filled 2022-08-25 (×9): qty 2

## 2022-08-25 MED ORDER — PANTOPRAZOLE SODIUM 40 MG PO TBEC
40.0000 mg | DELAYED_RELEASE_TABLET | Freq: Every day | ORAL | Status: DC
  Administered 2022-08-27 – 2022-08-30 (×4): 40 mg via ORAL
  Filled 2022-08-25 (×4): qty 1

## 2022-08-25 MED ORDER — CAMPHOR-MENTHOL 0.5-0.5 % EX LOTN: 1.0000 | TOPICAL_LOTION | Freq: Four times a day (QID) | CUTANEOUS | Status: DC | PRN

## 2022-08-25 MED ORDER — MIDODRINE HCL 5 MG PO TABS
10.0000 mg | ORAL_TABLET | ORAL | Status: DC
  Administered 2022-08-27 – 2022-08-29 (×2): 10 mg via ORAL
  Filled 2022-08-25 (×3): qty 2

## 2022-08-25 MED ORDER — SODIUM ZIRCONIUM CYCLOSILICATE 10 G PO PACK
10.0000 g | PACK | ORAL | Status: AC
  Filled 2022-08-25: qty 1

## 2022-08-25 MED ORDER — HEPARIN SODIUM (PORCINE) 5000 UNIT/ML IJ SOLN
5000.0000 [IU] | Freq: Three times a day (TID) | INTRAMUSCULAR | Status: DC
  Administered 2022-08-26 – 2022-08-30 (×11): 5000 [IU] via SUBCUTANEOUS
  Filled 2022-08-25 (×12): qty 1

## 2022-08-25 MED ORDER — OXYCODONE-ACETAMINOPHEN 5-325 MG PO TABS: 1.0000 | ORAL_TABLET | Freq: Four times a day (QID) | ORAL | Status: DC | PRN

## 2022-08-25 NOTE — H&P (Addendum)
History and Physical  [name] EQA:[id number] DOB: 06/25/1941 DOA: 08/25/2022  Referring physician: Candace Cruise  PCP: Elby Showers, MD  Outpatient Specialists: Cardiology, palliative care medicine. Patient coming from: Home.  Chief Complaint: Fall.  HPI: [name] is a 81 y.o. male with medical history significant for Alzheimer's dementia, ESRD on HD TTS, hypertension, type 2 diabetes, coronary artery disease, peripheral vascular disease, who presented to [facility] ED after a fall at home.  He was walking to the bathroom when he fell and landed on his left hip.  He could not bear weight on his left hip after the fall.  EMS was activated and he was brought to the ED for further evaluation.  In the ED, unable to obtain a history from the patient due to severe confusion.  No family members at bedside.  x-ray revealed left hip fracture.  EDP discussed the case with orthopedic surgery, Dr. Mable Fill.  Plan for orthopedic surgery repair on 12/3/20203.  N.p.o. after midnight.  TRH, hospitalist service was asked to admit.  ED Course: Tmax 97.3.  BP 150/65, pulse 71, respiratory 16, saturation 100% on room air.  Lab studies remarkable for sodium 134, potassium 5.3, BUN 28, creatinine 5.54.  Review of Systems: Review of systems as noted in the HPI. All other systems reviewed and are negative.   Past Medical History:  Diagnosis Date   Allergy    Alzheimer's disease ([place]) 01/19/2021   Anemia    Arthritis    Cataract    bil cateracts removed   Coronary artery disease    Diabetes mellitus    Type 2   Diverticulitis    ED (erectile dysfunction)    Elevated homocysteine    ESRD (end stage renal disease) on dialysis ([place]) 03/2015   M-W-F dialysis   GERD (gastroesophageal reflux disease)    pepto    Gout    Hiatal hernia    Hyperlipidemia    Hypertension    Hypothyroidism    PVD (peripheral vascular disease) ([place])    has plastic aorta   Renal insufficiency    Sleep apnea    does  not wear c-pap   Past Surgical History:  Procedure Laterality Date   ANGIOPLASTY Right 01/26/2021   Procedure: ANGIOPLASTY RIGHT SUBCLAVIAN VEIN;  Surgeon: Serafina Mitchell, MD;  Location: [place];  Service: Vascular;  Laterality: Right;   aortobifemoral bypass     AV FISTULA PLACEMENT Left 12/01/2013   Procedure: ARTERIOVENOUS (AV) FISTULA CREATION- LEFT BRACHIOCEPHALIC;  Surgeon: Angelia Mould, MD;  Location: [place];  Service: Vascular;  Laterality: Left;   BASCILIC VEIN TRANSPOSITION Right 07/27/2014   Procedure: BASCILIC VEIN TRANSPOSITION;  Surgeon: Angelia Mould, MD;  Location: [place];  Service: Vascular;  Laterality: Right;   BRAIN SURGERY  07/22/2018   BREAST SURGERY     left - granulomatous mastitis   BURR HOLE Bilateral 07/22/2018   Procedure: BILATERAL BURR HOLES;  Surgeon: Kristeen Miss, MD;  Location: [place];  Service: Neurosurgery;  Laterality: Bilateral;   COLONOSCOPY     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 ([place] [facility])  1992   ENTEROSCOPY N/A 06/03/2018   Procedure: ENTEROSCOPY;  Surgeon: Lavena Bullion, DO;  Location: [facility] ENDOSCOPY;  Service: Gastroenterology;  Laterality: N/A;   ENTEROSCOPY N/A 11/20/2018   Procedure: ENTEROSCOPY;  Surgeon: Rush Landmark Telford Nab., MD;  Location: [place];  Service: Gastroenterology;  Laterality: N/A;   ENTEROSCOPY N/A 12/03/2019   Procedure: ENTEROSCOPY;  Surgeon: Bryan Lemma,  Dominic Pea, DO;  Location: [facility] ENDOSCOPY;  Service: Gastroenterology;  Laterality: N/A;  push enteroscopy   ENTEROSCOPY N/A 07/04/2021   Procedure: ENTEROSCOPY;  Surgeon: Sharyn Creamer, MD;  Location: [facility] ENDOSCOPY;  Service: Gastroenterology;  Laterality: N/A;   ENTEROSCOPY N/A 11/05/2021   Procedure: ENTEROSCOPY;  Surgeon: Daryel November, MD;  Location: [facility] ENDOSCOPY;  Service: Gastroenterology;  Laterality: N/A;   EYE SURGERY Bilateral    cataracts   FISTULOGRAM Right 01/26/2021   Procedure: FISTULOGRAM RIGHT;  Surgeon: Serafina Mitchell, MD;  Location: [facility]  OR;  Service: Vascular;  Laterality: Right;   HEMODIALYSIS INPATIENT  01/17/2018       HOT HEMOSTASIS N/A 06/03/2018   Procedure: HOT HEMOSTASIS (ARGON PLASMA COAGULATION/BICAP);  Surgeon: Lavena Bullion, DO;  Location: [facility] ENDOSCOPY;  Service: Gastroenterology;  Laterality: N/A;   HOT HEMOSTASIS N/A 11/20/2018   Procedure: HOT HEMOSTASIS (ARGON PLASMA COAGULATION/BICAP);  Surgeon: Irving Copas., MD;  Location: [place];  Service: Gastroenterology;  Laterality: N/A;   HOT HEMOSTASIS N/A 12/03/2019   Procedure: HOT HEMOSTASIS (ARGON PLASMA COAGULATION/BICAP);  Surgeon: Lavena Bullion, DO;  Location: [facility] ENDOSCOPY;  Service: Gastroenterology;  Laterality: N/A;   INTRAMEDULLARY (IM) NAIL INTERTROCHANTERIC Right 11/08/2020   Procedure: INTRAMEDULLARY (IM) NAIL INTERTROCHANTRIC;  Surgeon: Erle Crocker, MD;  Location: [place];  Service: Orthopedics;  Laterality: Right;   REVISION OF ARTERIOVENOUS GORETEX GRAFT Right 09/01/2019   Procedure: REVISION OF ARTERIOVENOUS FISTULA RIGHT ARM;  Surgeon: Serafina Mitchell, MD;  Location: [place];  Service: Vascular;  Laterality: Right;   REVISON OF ARTERIOVENOUS FISTULA Left 02/09/2014   Procedure: REVISON OF LEFT ARTERIOVENOUS FISTULA - RESECTION OF RENDUNDANT VEIN;  Surgeon: Angelia Mould, MD;  Location: [place];  Service: Vascular;  Laterality: Left;   REVISON OF ARTERIOVENOUS FISTULA Right 01/26/2021   Procedure: REVISON OF ARTERIOVENOUS FISTULA RIGHT;  Surgeon: Serafina Mitchell, MD;  Location: [place];  Service: Vascular;  Laterality: Right;   SBO with lysis adhesions     SHUNTOGRAM Left 04/19/2014   Procedure: FISTULOGRAM;  Surgeon: Angelia Mould, MD;  Location: [facility] CATH LAB;  Service: Cardiovascular;  Laterality: Left;   UNILATERAL UPPER EXTREMEITY ANGIOGRAM N/A 07/12/2014   Procedure: UNILATERAL UPPER Anselmo Rod;  Surgeon: Angelia Mould, MD;  Location: [facility] CATH LAB;  Service: Cardiovascular;  Laterality: N/A;     Social History:  reports that he quit smoking about 27 years ago. His smoking use included cigarettes. He has never been exposed to tobacco smoke. He has never used smokeless tobacco. He reports that he does not drink alcohol and does not use drugs.   Allergies  Allergen Reactions   Ambien [Zolpidem Tartrate] Other (See Comments)    Hallucinations and "felt crazy"    Penicillins Hives and Rash    Has patient had a PCN reaction causing immediate rash, facial/tongue/throat swelling, SOB or lightheadedness with hypotension: Yes Has patient had a PCN reaction causing severe rash involving mucus membranes or skin necrosis: Yes Has patient had a PCN reaction that required hospitalization: No Has patient had a PCN reaction occurring within the last 10 years: No If all of the above answers are "NO", then may proceed with Cephalosporin use.     Family History  Problem Relation Age of Onset   Aneurysm Mother        brain   Heart disease Father    Stroke Father    Hypertension Father    Diabetes Father    Dementia Neg  Hx    Colon cancer Neg Hx    Esophageal cancer Neg Hx    Pancreatic cancer Neg Hx    Prostate cancer Neg Hx    Rectal cancer Neg Hx    Stomach cancer Neg Hx       Prior to Admission medications   Medication Sig Start Date End Date Taking? Authorizing Provider  acetaminophen (TYLENOL) 325 MG tablet Take 1-2 tablets (325-650 mg total) by mouth every 4 (four) hours as needed for mild pain. Patient taking differently: Take 650 mg by mouth in the morning and at bedtime. 08/01/18   Love, Ivan Anchors, PA-C  albuterol (VENTOLIN HFA) 108 (90 Base) MCG/ACT inhaler Inhale 1-2 puffs into the lungs daily as needed for wheezing or shortness of breath.    [provider]  allopurinol (ZYLOPRIM) 100 MG tablet Take 100 mg by mouth daily.    [provider]  Blood Glucose Monitoring Suppl (FREESTYLE FREEDOM LITE) w/Device KIT Use to check blood sugar 2 times per day dx  code E11.65 10/21/18   Elayne Snare, MD  butalbital-acetaminophen-caffeine (FIORICET) 50-325-40 MG tablet Take 1 tablet by mouth every 6 (six) hours as needed for headache.    [provider]  camphor-menthol Timoteo Ace) lotion Apply 1 application topically 4 (four) times daily as needed for itching.     [provider]  Carboxymethylcellulose Sodium (THERATEARS) 0.25 % SOLN Place 1 drop into both eyes 3 (three) times daily as needed (for dryness).    [provider]  cetirizine (ZYRTEC) 10 MG tablet Take 10 mg by mouth daily as needed for allergies.    [provider]  cyanocobalamin (,VITAMIN B-12,) 1000 MCG/ML injection Inject 1 mL (1,000 mcg total) into the muscle every 30 (thirty) days. 04/13/22   Elby Showers, MD  donepezil (ARICEPT) 10 MG tablet Take 1 tablet (10 mg total) by mouth at bedtime. 09/26/21   Melvenia Beam, MD  doxercalciferol (HECTOROL) 4 MCG/2ML injection Inject 2.5 mLs (5 mcg total) into the vein every Monday, Wednesday, and Friday with hemodialysis. 06/18/22   Allie Bossier, MD  fluticasone (FLONASE) 50 MCG/ACT nasal spray Place 1 spray into both nostrils daily. Patient taking differently: Place 2 sprays into both nostrils daily as needed for allergies. 10/08/14   Mikhail, Velta Addison, DO  glucose blood (FREESTYLE LITE) test strip USE AS DIRECTED THREE TIMES DAILY 02/16/22   Elayne Snare, MD  hydrocortisone 2.5 % cream Apply 1 application topically 3 (three) times daily as needed (skin irritation (legs & arms)).  01/13/19   [provider]  lacosamide (VIMPAT) 50 MG TABS tablet Take 1 tablet (50 mg total) by mouth 2 (two) times daily for 5 days. 08/19/22 08/24/22  Thurnell Lose, MD  lacosamide 100 MG TABS Take 1 tablet (100 mg total) by mouth 2 (two) times daily. 08/25/22   Thurnell Lose, MD  Lancets (FREESTYLE) lancets Use as instructed to check blood sugar 2 times per day dx code E11.65 02/22/16   Elayne Snare, MD  levothyroxine  (SYNTHROID, LEVOTHROID) 50 MCG tablet Take 1 tablet (50 mcg total) by mouth daily. Patient taking differently: Take 50 mcg by mouth daily before breakfast. 08/06/14   Elby Showers, MD  memantine (NAMENDA) 10 MG tablet Take 1 tablet (10 mg total) by mouth 2 (two) times daily. 09/26/21   Melvenia Beam, MD  midodrine (PROAMATINE) 10 MG tablet Take one pill prior to hemodialysis on MWF Patient taking differently: Take 10 mg by mouth  See admin instructions. Take 10 mg by mouth prior to hemodialysis on Mon/Wed/Fri 08/13/18   Love, Ivan Anchors, PA-C  Nutritional Supplements (FEEDING SUPPLEMENT, NEPRO CARB STEADY,) LIQD Take 237 mLs by mouth in the morning.    [provider]  omeprazole (PRILOSEC) 40 MG capsule Take 1 capsule (40 mg total) by mouth daily. Patient taking differently: Take 40 mg by mouth daily before breakfast. 03/13/18   E[place], Amy S, PA-C  QUEtiapine (SEROQUEL) 25 MG tablet Take 1 tablet (25 mg total) by mouth at bedtime. 08/06/22   Elby Showers, MD  repaglinide (PRANDIN) 0.5 MG tablet TAKE 1 TABLET(0.5 MG) BY MOUTH TWICE DAILY BEFORE A MEAL Patient taking differently: Take 0.5 mg by mouth See admin instructions. Take 0.5 mg by mouth at 5 pm (AFTER dialysis) only on Mon/Wed/Fri 05/29/22   Elayne Snare, MD  rosuvastatin (CRESTOR) 10 MG tablet Take 1 tablet (10 mg total) by mouth daily. 06/11/22   Eugenie Filler, MD  sevelamer carbonate (RENVELA) 800 MG tablet Take 1,600-2,400 mg by mouth See admin instructions. Take 2,400 mg by mouth three times a day with meals and 1,600 mg with snacks    [provider]  Syringe/Needle, Disp, (SYRINGE 3CC/25GX1") 25G X 1" 3 ML MISC 1 each by Does not apply route every 30 (thirty) days. 04/13/22   Elby Showers, MD  TRADJENTA 5 MG TABS tablet Take 1 tablet (5 mg total) by mouth daily. 11/16/21   Elayne Snare, MD    Physical Exam: BP (!) 154/65 (BP Location: Left Arm)   Pulse 71   Temp (!) 97.3 F (36.3 C) (Oral)   Resp 16   SpO2  100%   General: 81 y.o. year-old male well developed well nourished in no acute distress.  Alert and confused. Cardiovascular: Regular rate and rhythm with no rubs or gallops.  No thyromegaly or JVD noted.  No lower extremity edema. 2/4 pulses in all 4 extremities. Respiratory: Clear to auscultation with no wheezes or rales. Good inspiratory effort. Abdomen: Soft nontender nondistended with normal bowel sounds x4 quadrants. Muskuloskeletal: No cyanosis, clubbing or edema noted bilaterally.  LLE is externally rotated. Neuro: CN II-XII intact, strength, sensation, reflexes Skin: No ulcerative lesions noted or rashes Psychiatry: Judgement and insight appear altered. Mood is appropriate for condition and setting          Labs on Admission:  Basic Metabolic Panel: Recent Labs  Lab 08/19/22 0531 08/25/22 1935  NA 140 134*  K 4.6 5.3*  CL 97* 96*  CO2 27 24  GLUCOSE 84 86  BUN 39* 28*  CREATININE 7.05* 5.54*  CALCIUM 10.2 10.0   Liver Function Tests: No results for input(s): "AST", "ALT", "ALKPHOS", "BILITOT", "PROT", "ALBUMIN" in the last 168 hours. No results for input(s): "LIPASE", "AMYLASE" in the last 168 hours. No results for input(s): "AMMONIA" in the last 168 hours. CBC: Recent Labs  Lab 08/25/22 1935  WBC 8.2  HGB 11.3*  HCT 34.0*  MCV 105.6*  PLT 146*   Cardiac Enzymes: No results for input(s): "CKTOTAL", "CKMB", "CKMBINDEX", "TROPONINI" in the last 168 hours.  BNP (last 3 results) Recent Labs    06/06/22 2010 06/21/22 0038  BNP 3,250.3* 1,674.6*    ProBNP (last 3 results) No results for input(s): "PROBNP" in the last 8760 hours.  CBG: Recent Labs  Lab 08/19/22 0747 08/19/22 1528  GLUCAP 79 82    Radiological Exams on Admission: CT Lumbar Spine Wo Contrast  Result Date: 08/25/2022 CLINICAL DATA:  Fall EXAM: CT THORACIC AND LUMBAR SPINE WITHOUT CONTRAST TECHNIQUE: Multidetector CT imaging of the thoracic and lumbar spine was performed without  contrast. Multiplanar CT image reconstructions were also generated. RADIATION DOSE REDUCTION: This exam was performed according to the departmental dose-optimization program which includes automated exposure control, adjustment of the mA and/or kV according to patient size and/or use of iterative reconstruction technique. COMPARISON:  CTA chest 01/12/2021 CT abdomen pelvis 08/17/2022 FINDINGS: CT THORACIC SPINE FINDINGS Alignment: Normal. Vertebrae: Height loss at T1, T3 and T8 is unchanged. No acute fracture. Paraspinal and other soft tissues: Calcific aortic atherosclerosis. Disc levels: No spinal canal stenosis. CT LUMBAR SPINE FINDINGS Segmentation: 5 lumbar type vertebrae. Alignment: Normal. Vertebrae: Unchanged vertebra plana configuration of L1 compared to 08/17/2022. Remote augmentation of L3 with cement extending into the L2-3 disc space. Height loss at the other lumbar levels is also unchanged. Paraspinal and other soft tissues: Calcific aortic atherosclerosis. Infrarenal aortic graft. 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 IMPRESSION: 1. No acute fracture or static subluxation of the thoracic or lumbar spine. 2. Unchanged vertebra plana configuration of L1 compared to 08/17/2022. 3. Remote augmentation of L3 with cement extending into the L2-3 disc space. 4. 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 5. Chronic compression deformities of T1, T3 and T8 are unchanged since 01/12/2021. Aortic Atherosclerosis (ICD10-I70.0). Electronically Signed   By: Ulyses Jarred M.D.   On: 08/25/2022 20:16   CT Thoracic Spine Wo Contrast  Result Date: 08/25/2022 CLINICAL DATA:  Fall EXAM: CT THORACIC AND LUMBAR SPINE WITHOUT CONTRAST TECHNIQUE: Multidetector CT imaging of the thoracic and lumbar spine was performed without contrast. Multiplanar CT image reconstructions were also generated. RADIATION DOSE REDUCTION: This exam was performed according to the departmental dose-optimization program  which includes automated exposure control, adjustment of the mA and/or kV according to patient size and/or use of iterative reconstruction technique. COMPARISON:  CTA chest 01/12/2021 CT abdomen pelvis 08/17/2022 FINDINGS: CT THORACIC SPINE FINDINGS Alignment: Normal. Vertebrae: Height loss at T1, T3 and T8 is unchanged. No acute fracture. Paraspinal and other soft tissues: Calcific aortic atherosclerosis. Disc levels: No spinal canal stenosis. CT LUMBAR SPINE FINDINGS Segmentation: 5 lumbar type vertebrae. Alignment: Normal. Vertebrae: Unchanged vertebra plana configuration of L1 compared to 08/17/2022. Remote augmentation of L3 with cement extending into the L2-3 disc space. Height loss at the other lumbar levels is also unchanged. Paraspinal and other soft tissues: Calcific aortic atherosclerosis. Infrarenal aortic graft. 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 IMPRESSION: 1. No acute fracture or static subluxation of the thoracic or lumbar spine. 2. Unchanged vertebra plana configuration of L1 compared to 08/17/2022. 3. Remote augmentation of L3 with cement extending into the L2-3 disc space. 4. 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 5. Chronic compression deformities of T1, T3 and T8 are unchanged since 01/12/2021. Aortic Atherosclerosis (ICD10-I70.0). Electronically Signed   By: Ulyses Jarred M.D.   On: 08/25/2022 20:16   DG Hip Unilat W or Wo Pelvis 2-3 Views Left  Result Date: 08/25/2022 CLINICAL DATA:  Left hip pain. EXAM: DG HIP (WITH OR WITHOUT PELVIS) 3V LEFT COMPARISON:  Left hip radiographs 11/03/2021. FINDINGS: A minimally displaced intratrochanteric fracture is present in the proximal left femur. The hip joint is located. Extensive vascular calcifications are present. Right hip ORIF noted. Bowel gas pattern is normal. IMPRESSION: 1. Minimally displaced intratrochanteric fracture of the proximal left femur. 2. Extensive vascular calcifications. Electronically Signed    By:  San Morelle M.D.   On: 08/25/2022 19:34    EKG: I independently viewed the EKG done and my findings are as followed: Sinus rhythm rate of 75.  Nonspecific ST-T changes.  QTc 465.  Assessment/Plan Present on Admission: **None**  Principal Problem:   S/p left hip fracture  Fall status post left hip fracture, POA Pain control and bowel regimen Plan for orthopedic surgery repair on 08/26/2022 N.p.o. after midnight  ESRD on HD TTS EDP consulted nephrology to resume hemodialysis while inpatient Volume status and electrolytes managed with hemodialysis. Appreciate nephrology's assistance  Hyperkalemia Serum potassium 5.3 Lokelma 10 g x 1 given in the ED Repeat chemistry panel in the AM  Hypertension BPs are currently soft Monitor vital signs.  Hypothyroidism Resume home levothyroxine  Dementia/mood disorder Resume home regimen. Reorient as needed Fall precautions Delirium precautions     DVT prophylaxis: Subcu heparin 3 times daily  Code Status: Full code  Family Communication: None at baseline  Disposition Plan: Admitted to telemetry surgical  Consults called: Orthopedic surgery, Nephrology  Admission status: Inpatient status.   Status is: Inpatient The patient requires at least 2 midnights for further evaluation and treatment of present condition.   Kayleen Memos MD [facility] Pager [phone]  If 7PM-7AM, please contact night-coverage [email or web address] Password [facility]  08/25/2022, 10:28 PM

## 2022-08-25 NOTE — Consult Note (Signed)
Orthopaedic Consult  Date/Time: 08/25/22 9:41 PM  Patient Name: [name]  Attending Physician: Wyvonnia Dusky, MD    ASSESSMENT & PLAN  Orthopaedic Assessment: 81 y.o. male with left intertrochanteric hip fracture.  Plan: Explained to the patient that he has sustained a left pertrochanteric proximal femur fracture.  Both nonoperative and operative treatment options were discussed.  Nonoperative treatment would consist of prolonged immobilization with a delayed return to weightbearing.  This is associated with significant morbidity, including increased risk of pneumonia, UTI, pressure ulcers, DVT/VTE.  Given the patient's previous level of mobility and functional status, as well as desire to avoid complications and morbidity of nonoperative treatment, he/the family have elected to proceed with operative treatment with reduction and cephalomedullary nail fixation.  Risks of the proposed surgical treatment were discussed with the patient/family, including bleeding, wound healing complications, infection, damage to surrounding structures, persistent pain, stiffness, lack of improvement, potential for subsequent arthritis or worsening of pre-existing arthritis, nonunion, malunion, and need for further surgery, as well as complications related to anesthesia, cardiovascular complications, and death.  All questions were answered to the satisfaction of the patient/family.  He/the family understand(s) all of this and wishes to proceed with surgery.  Plan for or 08/26/2022 AM.  Keep n.p.o. after midnight.  Remainder of care per primary team.   Georgeanna Harrison M.D. Orthopaedic Surgery [facility]   Medical Decision Making  Amount/complexity of data: Is there a current pathologic fracture (e.g. neoplastic, osteoporotic insufficiency fracture)? Yes Independent interpretation of radiographic studies: Yes Review of radiology results (e.g. reports): Yes Tests ordered (e.g.  additional radiographic studies, labs): Yes Lab results reviewed: Yes Reviewed old records: Yes History from another source (independent historian, e.g. family/friend/etc.): Yes Discussion of imaging, clinical data, and or management with independent medical provider: Yes Risk: Patient receiving IV controlled substances for pain: Yes Fracture requiring manipulation: No Urgent or emergent (non-elective) surgery likely this admission: Yes Presence of medical comorbidities and/or surgical risk factors (e.g. current smoker, CAD, diabetes, COPD, CKD, etc.): Yes Closed fracture management WITHOUT manipulation: No Urgent minor procedure (e.g. joint aspiration, compartment pressure measurement, etc.): No Will likely need surgery as an outpatient: No     HPI [name] is a 81 y.o. male. Orthopaedic consultation has specifically been requested to address this patient's current musculoskeletal presentation. He sustained a fall and had immediate pain in the left hip/thigh.  Pain is sharp and severe.  Pain is worse movement and better with rest and immobilization.  Since the fall the patient was not able to ambulate or bear weight due to pain.  At baseline the patient is ambulatory with a walker.  He denies additional injuries.   PMH Past Medical History:  Diagnosis Date   Allergy    Alzheimer's disease ([place]) 01/19/2021   Anemia    Arthritis    Cataract    bil cateracts removed   Coronary artery disease    Diabetes mellitus    Type 2   Diverticulitis    ED (erectile dysfunction)    Elevated homocysteine    ESRD (end stage renal disease) on dialysis ([place]) 03/2015   M-W-F dialysis   GERD (gastroesophageal reflux disease)    pepto    Gout    Hiatal hernia    Hyperlipidemia    Hypertension    Hypothyroidism    PVD (peripheral vascular disease) ([place])    has plastic aorta   Renal insufficiency    Sleep apnea  does not wear c-pap     PSH Past Surgical History:  Procedure  Laterality Date   ANGIOPLASTY Right 01/26/2021   Procedure: ANGIOPLASTY RIGHT SUBCLAVIAN VEIN;  Surgeon: Serafina Mitchell, MD;  Location: [place] OR;  Service: Vascular;  Laterality: Right;   aortobifemoral bypass     AV FISTULA PLACEMENT Left 12/01/2013   Procedure: ARTERIOVENOUS (AV) FISTULA CREATION- LEFT BRACHIOCEPHALIC;  Surgeon: Angelia Mould, MD;  Location: [place];  Service: Vascular;  Laterality: Left;   [place] Right 07/27/2014   Procedure: [place];  Surgeon: Angelia Mould, MD;  Location: [place];  Service: Vascular;  Laterality: Right;   BRAIN SURGERY  07/22/2018   BREAST SURGERY     left - granulomatous mastitis   BURR HOLE Bilateral 07/22/2018   Procedure: BILATERAL BURR HOLES;  Surgeon: Kristeen Miss, MD;  Location: [place];  Service: Neurosurgery;  Laterality: Bilateral;   COLONOSCOPY     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 ([place] [facility])  1992   ENTEROSCOPY N/A 06/03/2018   Procedure: ENTEROSCOPY;  Surgeon: Lavena Bullion, DO;  Location: [facility] ENDOSCOPY;  Service: Gastroenterology;  Laterality: N/A;   ENTEROSCOPY N/A 11/20/2018   Procedure: ENTEROSCOPY;  Surgeon: Rush Landmark Telford Nab., MD;  Location: [place];  Service: Gastroenterology;  Laterality: N/A;   ENTEROSCOPY N/A 12/03/2019   Procedure: ENTEROSCOPY;  Surgeon: Lavena Bullion, DO;  Location: [facility] ENDOSCOPY;  Service: Gastroenterology;  Laterality: N/A;  push enteroscopy   ENTEROSCOPY N/A 07/04/2021   Procedure: ENTEROSCOPY;  Surgeon: Sharyn Creamer, MD;  Location[facility] ENDOSCOPY;  Service: Gastroenterology;  Laterality: N/A;   ENTEROSCOPY N/A 11/05/2021   Procedure: ENTEROSCOPY;  Surgeon: Daryel November, MD;  Location: [facility] ENDOSCOPY;  Service: Gastroenterology;  Laterality: N/A;   EYE SURGERY Bilateral    cataracts   FISTULOGRAM Right 01/26/2021   Procedure: FISTULOGRAM RIGHT;  Surgeon: Serafina Mitchell, MD;  Location: [facility] OR;  Service: Vascular;  Laterality: Right;    HEMODIALYSIS INPATIENT  01/17/2018       HOT HEMOSTASIS N/A 06/03/2018   Procedure: HOT HEMOSTASIS (ARGON PLASMA COAGULATION/BICAP);  Surgeon: Lavena Bullion, DO;  Location: [facility] ENDOSCOPY;  Service: Gastroenterology;  Laterality: N/A;   HOT HEMOSTASIS N/A 11/20/2018   Procedure: HOT HEMOSTASIS (ARGON PLASMA COAGULATION/BICAP);  Surgeon: Irving Copas., MD;  Location: [place];  Service: Gastroenterology;  Laterality: N/A;   HOT HEMOSTASIS N/A 12/03/2019   Procedure: HOT HEMOSTASIS (ARGON PLASMA COAGULATION/BICAP);  Surgeon: Lavena Bullion, DO;  Location: [facility] ENDOSCOPY;  Service: Gastroenterology;  Laterality: N/A;   INTRAMEDULLARY (IM) NAIL INTERTROCHANTERIC Right 11/08/2020   Procedure: INTRAMEDULLARY (IM) NAIL INTERTROCHANTRIC;  Surgeon: Erle Crocker, MD;  Location: [place];  Service: Orthopedics;  Laterality: Right;   REVISION OF ARTERIOVENOUS GORETEX GRAFT Right 09/01/2019   Procedure: REVISION OF ARTERIOVENOUS FISTULA RIGHT ARM;  Surgeon: Serafina Mitchell, MD;  Location: [place];  Service: Vascular;  Laterality: Right;   REVISON OF ARTERIOVENOUS FISTULA Left 02/09/2014   Procedure: REVISON OF LEFT ARTERIOVENOUS FISTULA - RESECTION OF RENDUNDANT VEIN;  Surgeon: Angelia Mould, MD;  Location: [place];  Service: Vascular;  Laterality: Left;   REVISON OF ARTERIOVENOUS FISTULA Right 01/26/2021   Procedure: REVISON OF ARTERIOVENOUS FISTULA RIGHT;  Surgeon: Serafina Mitchell, MD;  Location: [place];  Service: Vascular;  Laterality: Right;   SBO with lysis adhesions     SHUNTOGRAM Left 04/19/2014   Procedure: FISTULOGRAM;  Surgeon: Angelia Mould, MD;  Location: [facility] CATH  LAB;  Service: Cardiovascular;  Laterality: Left;   UNILATERAL UPPER EXTREMEITY ANGIOGRAM N/A 07/12/2014   Procedure: UNILATERAL UPPER Anselmo Rod;  Surgeon: Angelia Mould, MD;  Location: [facility] CATH LAB;  Service: Cardiovascular;  Laterality: N/A;   Home Medications Prior to Admission medications    Medication Sig Start Date End Date Taking? Authorizing Provider  acetaminophen (TYLENOL) 325 MG tablet Take 1-2 tablets (325-650 mg total) by mouth every 4 (four) hours as needed for mild pain. Patient taking differently: Take 650 mg by mouth in the morning and at bedtime. 08/01/18   Love, Ivan Anchors, PA-C  albuterol (VENTOLIN HFA) 108 (90 Base) MCG/ACT inhaler Inhale 1-2 puffs into the lungs daily as needed for wheezing or shortness of breath.    [provider]  allopurinol (ZYLOPRIM) 100 MG tablet Take 100 mg by mouth daily.    [provider]  Blood Glucose Monitoring Suppl (FREESTYLE FREEDOM LITE) w/Device KIT Use to check blood sugar 2 times per day dx code E11.65 10/21/18   Elayne Snare, MD  butalbital-acetaminophen-caffeine (FIORICET) 50-325-40 MG tablet Take 1 tablet by mouth every 6 (six) hours as needed for headache.    [provider]  camphor-menthol Timoteo Ace) lotion Apply 1 application topically 4 (four) times daily as needed for itching.     [provider]  Carboxymethylcellulose Sodium (THERATEARS) 0.25 % SOLN Place 1 drop into both eyes 3 (three) times daily as needed (for dryness).    [provider]  cetirizine (ZYRTEC) 10 MG tablet Take 10 mg by mouth daily as needed for allergies.    [provider]  cyanocobalamin (,VITAMIN B-12,) 1000 MCG/ML injection Inject 1 mL (1,000 mcg total) into the muscle every 30 (thirty) days. 04/13/22   Elby Showers, MD  donepezil (ARICEPT) 10 MG tablet Take 1 tablet (10 mg total) by mouth at bedtime. 09/26/21   Melvenia Beam, MD  doxercalciferol (HECTOROL) 4 MCG/2ML injection Inject 2.5 mLs (5 mcg total) into the vein every Monday, Wednesday, and Friday with hemodialysis. 06/18/22   Allie Bossier, MD  fluticasone (FLONASE) 50 MCG/ACT nasal spray Place 1 spray into both nostrils daily. Patient taking differently: Place 2 sprays into both nostrils daily as needed for allergies. 10/08/14   Mikhail,  Velta Addison, DO  glucose blood (FREESTYLE LITE) test strip USE AS DIRECTED THREE TIMES DAILY 02/16/22   Elayne Snare, MD  hydrocortisone 2.5 % cream Apply 1 application topically 3 (three) times daily as needed (skin irritation (legs & arms)).  01/13/19   [provider]  lacosamide (VIMPAT) 50 MG TABS tablet Take 1 tablet (50 mg total) by mouth 2 (two) times daily for 5 days. 08/19/22 08/24/22  Thurnell Lose, MD  lacosamide 100 MG TABS Take 1 tablet (100 mg total) by mouth 2 (two) times daily. 08/25/22   Thurnell Lose, MD  Lancets (FREESTYLE) lancets Use as instructed to check blood sugar 2 times per day dx code E11.65 02/22/16   Elayne Snare, MD  levothyroxine (SYNTHROID, LEVOTHROID) 50 MCG tablet Take 1 tablet (50 mcg total) by mouth daily. Patient taking differently: Take 50 mcg by mouth daily before breakfast. 08/06/14   Elby Showers, MD  memantine (NAMENDA) 10 MG tablet Take 1 tablet (10 mg total) by mouth 2 (two) times daily. 09/26/21   Melvenia Beam, MD  midodrine (PROAMATINE) 10 MG tablet Take one pill prior to hemodialysis on MWF Patient taking differently: Take 10 mg by mouth See admin instructions. Take 10 mg  by mouth prior to hemodialysis on Mon/Wed/Fri 08/13/18   Love, Ivan Anchors, PA-C  Nutritional Supplements (FEEDING SUPPLEMENT, NEPRO CARB STEADY,) LIQD Take 237 mLs by mouth in the morning.    [provider]  omeprazole (PRILOSEC) 40 MG capsule Take 1 capsule (40 mg total) by mouth daily. Patient taking differently: Take 40 mg by mouth daily before breakfast. 03/13/18   Esterwood, Amy S, PA-C  QUEtiapine (SEROQUEL) 25 MG tablet Take 1 tablet (25 mg total) by mouth at bedtime. 08/06/22   Elby Showers, MD  repaglinide (PRANDIN) 0.5 MG tablet TAKE 1 TABLET(0.5 MG) BY MOUTH TWICE DAILY BEFORE A MEAL Patient taking differently: Take 0.5 mg by mouth See admin instructions. Take 0.5 mg by mouth at 5 pm (AFTER dialysis) only on Mon/Wed/Fri 05/29/22   Elayne Snare, MD   rosuvastatin (CRESTOR) 10 MG tablet Take 1 tablet (10 mg total) by mouth daily. 06/11/22   Eugenie Filler, MD  sevelamer carbonate (RENVELA) 800 MG tablet Take 1,600-2,400 mg by mouth See admin instructions. Take 2,400 mg by mouth three times a day with meals and 1,600 mg with snacks    [provider]  Syringe/Needle, Disp, (SYRINGE 3CC/25GX1") 25G X 1" 3 ML MISC 1 each by Does not apply route every 30 (thirty) days. 04/13/22   Elby Showers, MD  TRADJENTA 5 MG TABS tablet Take 1 tablet (5 mg total) by mouth daily. 11/16/21   Elayne Snare, MD     Allergies Allergies  Allergen Reactions   Ambien [Zolpidem Tartrate] Other (See Comments)    Hallucinations and "felt crazy"    Penicillins Hives and Rash    Has patient had a PCN reaction causing immediate rash, facial/tongue/throat swelling, SOB or lightheadedness with hypotension: Yes Has patient had a PCN reaction causing severe rash involving mucus membranes or skin necrosis: Yes Has patient had a PCN reaction that required hospitalization: No Has patient had a PCN reaction occurring within the last 10 years: No If all of the above answers are "NO", then may proceed with Cephalosporin use.      Family History Family History  Problem Relation Age of Onset   Aneurysm Mother        brain   Heart disease Father    Stroke Father    Hypertension Father    Diabetes Father    Dementia Neg Hx    Colon cancer Neg Hx    Esophageal cancer Neg Hx    Pancreatic cancer Neg Hx    Prostate cancer Neg Hx    Rectal cancer Neg Hx    Stomach cancer Neg Hx     Social History Social History   Socioeconomic History   Marital status: Married    Spouse name: [name] Mood   Number of children: 1   Years of education: 16   Highest education level: Not on file  Occupational History   Occupation: Retired  Tobacco Use   Smoking status: Former    Types: Cigarettes    Quit date: 09/24/1994    Years since quitting: 27.9    Passive exposure: Never    Smokeless tobacco: Never  Vaping Use   Vaping Use: Never used  Substance and Sexual Activity   Alcohol use: No    Alcohol/week: 0.0 standard drinks of alcohol    Comment: Quit Oct. 1977 ("somewhat heavy")   Drug use: No   Sexual activity: Not on file  Other Topics Concern   Not on file  Social History Narrative  Lives at home with wife.   Caffeine use: Drinks no tea, "a little soda"   Drinks 1 cup coffee/week   Right handed   VETERAN   Social Determinants of Health   Financial Resource Strain: Not on file  Food Insecurity: No Food Insecurity (06/25/2022)   Hunger Vital Sign    Worried About Running Out of Food in the Last Year: Never true    Ran Out of Food in the Last Year: Never true  Transportation Needs: No Transportation Needs (06/25/2022)   PRAPARE - Hydrologist (Medical): No    Lack of Transportation (Non-Medical): No  Physical Activity: Insufficiently Active (07/30/2017)   Exercise Vital Sign    Days of Exercise per Week: 3 days    Minutes of Exercise per Session: 10 min  Stress: No Stress Concern Present (07/30/2017)   [place]    Feeling of Stress : Not at all  Social Connections: Not on file  Intimate Partner Violence: Not on file     Review of Systems MSK: As noted per HPI above GI: No current Nausea/vomiting ENT: Denies sore throat, epistaxis CV: Denies chest pain  Resp: No current shortness of breath  Other than mentioned above, there are no Constitutional, Neurological, Psychiatric, ENT, Ophthalmological, Cardiovascular, Respiratory, GI, GU, Musculoskeletal, Integumentary, Lymphatic, Endocrine or Allergic issues.     Imaging  Independent interpretation of orthopaedic-relevant films: Mildly displaced left intertrochanteric hip fracture seen on pelvic and hip images  Radiographic results: CT Lumbar Spine Wo Contrast  Result Date: 08/25/2022 CLINICAL  DATA:  Fall EXAM: CT THORACIC AND LUMBAR SPINE WITHOUT CONTRAST TECHNIQUE: Multidetector CT imaging of the thoracic and lumbar spine was performed without contrast. Multiplanar CT image reconstructions were also generated. RADIATION DOSE REDUCTION: This exam was performed according to the departmental dose-optimization program which includes automated exposure control, adjustment of the mA and/or kV according to patient size and/or use of iterative reconstruction technique. COMPARISON:  CTA chest 01/12/2021 CT abdomen pelvis 08/17/2022 FINDINGS: CT THORACIC SPINE FINDINGS Alignment: Normal. Vertebrae: Height loss at T1, T3 and T8 is unchanged. No acute fracture. Paraspinal and other soft tissues: Calcific aortic atherosclerosis. Disc levels: No spinal canal stenosis. CT LUMBAR SPINE FINDINGS Segmentation: 5 lumbar type vertebrae. Alignment: Normal. Vertebrae: Unchanged vertebra plana configuration of L1 compared to 08/17/2022. Remote augmentation of L3 with cement extending into the L2-3 disc space. Height loss at the other lumbar levels is also unchanged. Paraspinal and other soft tissues: Calcific aortic atherosclerosis. Infrarenal aortic graft. 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 IMPRESSION: 1. No acute fracture or static subluxation of the thoracic or lumbar spine. 2. Unchanged vertebra plana configuration of L1 compared to 08/17/2022. 3. Remote augmentation of L3 with cement extending into the L2-3 disc space. 4. 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 5. Chronic compression deformities of T1, T3 and T8 are unchanged since 01/12/2021. Aortic Atherosclerosis (ICD10-I70.0). Electronically Signed   By: Ulyses Jarred M.D.   On: 08/25/2022 20:16   CT Thoracic Spine Wo Contrast  Result Date: 08/25/2022 CLINICAL DATA:  Fall EXAM: CT THORACIC AND LUMBAR SPINE WITHOUT CONTRAST TECHNIQUE: Multidetector CT imaging of the thoracic and lumbar spine was performed without contrast.  Multiplanar CT image reconstructions were also generated. RADIATION DOSE REDUCTION: This exam was performed according to the departmental dose-optimization program which includes automated exposure control, adjustment of the mA and/or kV according to patient size and/or use  of iterative reconstruction technique. COMPARISON:  CTA chest 01/12/2021 CT abdomen pelvis 08/17/2022 FINDINGS: CT THORACIC SPINE FINDINGS Alignment: Normal. Vertebrae: Height loss at T1, T3 and T8 is unchanged. No acute fracture. Paraspinal and other soft tissues: Calcific aortic atherosclerosis. Disc levels: No spinal canal stenosis. CT LUMBAR SPINE FINDINGS Segmentation: 5 lumbar type vertebrae. Alignment: Normal. Vertebrae: Unchanged vertebra plana configuration of L1 compared to 08/17/2022. Remote augmentation of L3 with cement extending into the L2-3 disc space. Height loss at the other lumbar levels is also unchanged. Paraspinal and other soft tissues: Calcific aortic atherosclerosis. Infrarenal aortic graft. 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 IMPRESSION: 1. No acute fracture or static subluxation of the thoracic or lumbar spine. 2. Unchanged vertebra plana configuration of L1 compared to 08/17/2022. 3. Remote augmentation of L3 with cement extending into the L2-3 disc space. 4. 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 5. Chronic compression deformities of T1, T3 and T8 are unchanged since 01/12/2021. Aortic Atherosclerosis (ICD10-I70.0). Electronically Signed   By: Ulyses Jarred M.D.   On: 08/25/2022 20:16   DG Hip Unilat W or Wo Pelvis 2-3 Views Left  Result Date: 08/25/2022 CLINICAL DATA:  Left hip pain. EXAM: DG HIP (WITH OR WITHOUT PELVIS) 3V LEFT COMPARISON:  Left hip radiographs 11/03/2021. FINDINGS: A minimally displaced intratrochanteric fracture is present in the proximal left femur. The hip joint is located. Extensive vascular calcifications are present. Right hip ORIF noted. Bowel gas pattern  is normal. IMPRESSION: 1. Minimally displaced intratrochanteric fracture of the proximal left femur. 2. Extensive vascular calcifications. Electronically Signed   By: San Morelle M.D.   On: 08/25/2022 19:34   EEG adult  Result Date: 08/18/2022 Derek Jack, MD     08/18/2022  6:42 PM Routine EEG Report [name] is a 81 y.o. male with a history of seizure-like episode who is undergoing an EEG to evaluate for seizures. Report: This EEG was acquired with electrodes placed according to the International 10-20 electrode system (including Fp1, Fp2, F3, F4, C3, C4, P3, P4, O1, O2, T3, T4, T5, T6, A1, A2, Fz, Cz, Pz). The following electrodes were missing or displaced: none. The occipital dominant rhythm was 6-7 Hz with overriding beta frequencies. This activity is reactive to stimulation. Drowsiness was manifested by background fragmentation; deeper stages of sleep were identified by K complexes and sleep spindles. There was no focal slowing. There were no interictal epileptiform discharges. There were no electrographic seizures identified. Photic stimulation and hyperventilation were not performed. Impression and clinical correlation: This EEG was obtained while awake and asleep and is abnormal due to mild-to-moderate diffuse slowing indicative of global cerebral dysfunction. Epileptiform abnormalities were not seen during this recording. Su Monks, MD [facility] [phone] If 7pm- 7am, please page neurology on call as listed in [place].   CT ABDOMEN PELVIS WO CONTRAST  Result Date: 08/18/2022 CLINICAL DATA:  Pain and elevated lipase EXAM: CT ABDOMEN AND PELVIS WITHOUT CONTRAST TECHNIQUE: Multidetector CT imaging of the abdomen and pelvis was performed following the standard protocol without IV contrast. RADIATION DOSE REDUCTION: This exam was performed according to the departmental dose-optimization program which includes automated exposure control, adjustment of the mA  and/or kV according to patient size and/or use of iterative reconstruction technique. COMPARISON:  CT abdomen and pelvis 06/14/2022 FINDINGS: Lower chest: Emphysema. Bibasilar atelectasis/scarring. No acute abnormality. Hepatobiliary: No suspicious focal liver abnormality is seen. Subcentimeter low-density lesions are unchanged and presumably cysts. No specific follow-up imaging recommended.  No gallstones, gallbladder wall thickening, or biliary dilatation. Pancreas: Unremarkable. No pancreatic ductal dilatation or surrounding inflammatory changes. Spleen: Normal in size without focal abnormality. Adrenals/Urinary Tract: Unremarkable adrenal glands. Atrophic native kidneys. Unchanged low-attenuation lesions in the kidneys are statistically likely to represent cysts. No follow-up is required. Stomach/Bowel: Stomach is within normal limits. Appendix appears normal. No evidence of bowel wall thickening, distention, or inflammatory changes. Vascular/Lymphatic: Advanced aortic atherosclerotic calcification. No abdominopelvic lymphadenopathy. Prior aorto bi-iliac stent graft. Reproductive: Unremarkable. Other: No free intraperitoneal fluid or air. Postsurgical changes anterior abdominal wall, unchanged. Musculoskeletal: No interval change. Compression fractures of all of the lumbar vertebral bodies are similar and most marked about L1 where there is complete vertebral body height loss. Vertebroplasty L3. Right hip ORIF. IMPRESSION: No acute abnormality in the abdomen or pelvis. Multiple chronic findings detailed and findings and unchanged from 06/14/2022. Electronically Signed   By: Placido Sou M.D.   On: 08/18/2022 00:00   CT HEAD WO CONTRAST (5MM)  Result Date: 08/17/2022 CLINICAL DATA:  Twitching, possible seizure activity EXAM: CT HEAD WITHOUT CONTRAST TECHNIQUE: Contiguous axial images were obtained from the base of the skull through the vertex without intravenous contrast. RADIATION DOSE REDUCTION: This  exam was performed according to the departmental dose-optimization program which includes automated exposure control, adjustment of the mA and/or kV according to patient size and/or use of iterative reconstruction technique. COMPARISON:  05/24/2022 FINDINGS: Brain: No acute hemorrhage, acute infarction or space-occupying mass lesion is noted. Chronic white matter ischemic changes and generalized atrophy are again noted. Bifrontal fluid collections are again identified stable in appearance from the prior CT examination left slightly greater than right consistent with chronic subdural hematomas. No acute component is noted. Vascular: No hyperdense vessel or unexpected calcification. Skull: Negative for fracture or focal lesion. Prior bilateral burr holes are again noted. Sinuses/Orbits: No acute finding. Other: None. 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 No acute component is noted. Electronically Signed   By: Inez Catalina M.D.   On: 08/17/2022 23:04   DG Chest 2 View  Result Date: 08/17/2022 CLINICAL DATA:  Generalized weakness EXAM: CHEST - 2 VIEW COMPARISON:  06/21/2022 FINDINGS: Cardiac shadow is prominent but stable. Aortic calcifications are seen. The lungs are well aerated bilaterally. No focal infiltrate or effusion noted. No bony abnormality is seen. IMPRESSION: No active cardiopulmonary disease. Electronically Signed   By: Inez Catalina M.D.   On: 08/17/2022 22:59   Labs  Recent Labs    08/25/22 1935  WBC 8.2  HGB 11.3*  HCT 34.0*  PLT 146*   Recent Labs    08/19/22 0531 08/25/22 1935  NA 140 134*  K 4.6 5.3*  CL 97* 96*  CO2 27 24  BUN 39* 28*  CREATININE 7.05* 5.54*  GLUCOSE 84 86  CALCIUM 10.2 10.0   Lab Results  Component Value Date   INR 1.1 11/05/2021   INR 1.0 01/12/2021   INR 0.97 07/22/2018        Physical Examination  Patient is a 81 y.o. year old male who is chronically ill appearing, mood is  calm.  Orientation: oriented to person, place, time, and general circumstances  Vital Signs: BP (!) 154/65 (BP Location: Left Arm)   Pulse 71   Temp (!) 97.3 F (36.3 C) (Oral)   Resp 16   SpO2 100%    Gait: Unable to ambulate.  Supine on stretcher.  Heart: Normal rate Lungs: Non-labored breathing Abdomen: Soft,  Non-tender   Right Upper Extremity: Inspection: Atraumatic Palpation: Nontender ROM: Full, painless Strength: Normal Sensation: Intact to light touch distally Skin: Intact Peripheral Vascular: Well perfused Joint Stability: No instability Reflexes: No pathologic Lymph Nodes: None Palpable Coordination: Intact, normal   Left Upper Extremity: Inspection: Atraumatic Palpation: Nontender ROM: Full, painless Strength: Normal Sensation: Intact to light touch distally Skin: Intact, well-healed benign appearing incision on arm with apparent AV fistula Peripheral Vascular: Well perfused Joint Stability: No instability Reflexes: No pathologic Lymph Nodes: None Palpable Coordination: Intact, normal    Right Lower Extremity: Inspection: Atraumatic Palpation: Nontender ROM: Full, painless Strength: Normal Sensation: Intact to light touch distally Skin: Intact Peripheral Vascular: Well perfused Joint Stability: No instability Reflexes: No pathologic Lymph Nodes: None Palpable Coordination: Intact, normal   Left Lower Extremity: Inspection: Held in external rotated position Palpation: Tender to palpation at hip and at site of known fracture ROM: Hip range of motion severely limited due to pain Strength: Demonstrates intact dorsiflexion, plantarflexion, EHL Sensation: Intact light touch distally superficial peroneal, deep peroneal, tibial distributions Skin: Intact Peripheral Vascular: Normal DP pulse, warm well-perfused distally Joint Stability: Knee stable to varus valgus stress Reflexes: No pathologic Lymph Nodes: None Palpable Coordination: Limited by pain  and injury    Pelvis: Skin: Intact Palpation: Nontender Stability: No instability      The review of the patient's medications does not in any way constitute an endorsement, by this clinician,  of their use, dosage, indications, route, efficacy, interactions, or other clinical parameters.  This note was generated within the [employer] EMR using Dragon medical speech recognition software and may contain inherent errors or omissions not intended by the user. Grammatical and punctuation errors, random word insertions, deletions, pronoun errors and incomplete sentences are occasional consequences of this technology due to software limitations. Not all errors are caught or corrected.  Although every attempt is made to root out erroneus and incomplete transcription, the note may still not fully represent the intent or opinion of the author. If there are questions or concerns about the content of this note or information contained within the body of this dictation they should be addressed directly with the author for clarification.

## 2022-08-25 NOTE — ED Provider Notes (Signed)
[facility] EMERGENCY DEPARTMENT Provider Note   CSN: [id number] Arrival date & time: 08/25/22  1803     History  Chief Complaint  Patient presents with   Fall   Lt hip pain    [name] is a 81 y.o. male.   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  He follows with Dr. Melony Overly of orthopedist and has a history of a right hip fracture status post repair.  He uses his walker to get around.  Seems that he slipped out of a chair and fell onto his left side and has pain to his left hip.  He rates it 10/10 was given a small amount of fentanyl by EMS and states that it is achy and painful but not severely so.  He denies any chest pain difficulty breathing.  No head injury or loss of consciousness this was witnessed by his wife who I discussed the case with.  He is not on any anticoagulation  Per wife patient is at his mental baseline which is primarily oriented to self and events and place but not oriented to date, time, year and has significant memory issues.       Home Medications Prior to Admission medications   Medication Sig Start Date End Date Taking? Authorizing Provider  acetaminophen (TYLENOL) 325 MG tablet Take 1-2 tablets (325-650 mg total) by mouth every 4 (four) hours as needed for mild pain. Patient taking differently: Take 650 mg by mouth in the morning and at bedtime. 08/01/18   Love, Ivan Anchors, PA-C  albuterol (VENTOLIN HFA) 108 (90 Base) MCG/ACT inhaler Inhale 1-2 puffs into the lungs daily as needed for wheezing or shortness of breath.    [provider]  allopurinol (ZYLOPRIM) 100 MG tablet Take 100 mg by mouth daily.    [provider]  Blood Glucose Monitoring Suppl (FREESTYLE FREEDOM LITE) w/Device KIT Use to check blood sugar 2 times per day dx code E11.65 10/21/18   Elayne Snare,  MD  butalbital-acetaminophen-caffeine (FIORICET) 50-325-40 MG tablet Take 1 tablet by mouth every 6 (six) hours as needed for headache.    [provider]  camphor-menthol Timoteo Ace) lotion Apply 1 application topically 4 (four) times daily as needed for itching.     [provider]  Carboxymethylcellulose Sodium (THERATEARS) 0.25 % SOLN Place 1 drop into both eyes 3 (three) times daily as needed (for dryness).    [provider]  cetirizine (ZYRTEC) 10 MG tablet Take 10 mg by mouth daily as needed for allergies.    [provider]  cyanocobalamin (,VITAMIN B-12,) 1000 MCG/ML injection Inject 1 mL (1,000 mcg total) into the muscle every 30 (thirty) days. 04/13/22   Elby Showers, MD  donepezil (ARICEPT) 10 MG tablet Take 1 tablet (10 mg total) by mouth at bedtime. 09/26/21   Melvenia Beam, MD  doxercalciferol (HECTOROL) 4 MCG/2ML injection Inject 2.5 mLs (5 mcg total) into the vein every Monday, Wednesday, and Friday with hemodialysis. 06/18/22   Allie Bossier, MD  fluticasone (FLONASE) 50 MCG/ACT nasal spray Place 1 spray into both nostrils daily. Patient taking differently: Place 2 sprays into both nostrils daily as needed for allergies. 10/08/14   Mikhail, Velta Addison, DO  glucose blood (FREESTYLE LITE) test strip USE AS DIRECTED THREE TIMES DAILY 02/16/22   Elayne Snare, MD  hydrocortisone 2.5 % cream Apply 1 application topically 3 (three) times daily as needed (skin irritation (legs & arms)).  01/13/19   [provider]  lacosamide (VIMPAT) 50 MG TABS tablet Take 1 tablet (50 mg total) by mouth 2 (two) times daily for 5 days. 08/19/22 08/24/22  Thurnell Lose, MD  lacosamide 100 MG TABS Take 1 tablet (100 mg total) by mouth 2 (two) times daily. 08/25/22   Thurnell Lose, MD  Lancets (FREESTYLE) lancets Use as instructed to check blood sugar 2 times per day dx code E11.65 02/22/16   Elayne Snare, MD  levothyroxine (SYNTHROID, LEVOTHROID) 50 MCG tablet Take 1  tablet (50 mcg total) by mouth daily. Patient taking differently: Take 50 mcg by mouth daily before breakfast. 08/06/14   Elby Showers, MD  memantine (NAMENDA) 10 MG tablet Take 1 tablet (10 mg total) by mouth 2 (two) times daily. 09/26/21   Melvenia Beam, MD  midodrine (PROAMATINE) 10 MG tablet Take one pill prior to hemodialysis on MWF Patient taking differently: Take 10 mg by mouth See admin instructions. Take 10 mg by mouth prior to hemodialysis on Mon/Wed/Fri 08/13/18   Love, Ivan Anchors, PA-C  Nutritional Supplements (FEEDING SUPPLEMENT, NEPRO CARB STEADY,) LIQD Take 237 mLs by mouth in the morning.    [provider]  omeprazole (PRILOSEC) 40 MG capsule Take 1 capsule (40 mg total) by mouth daily. Patient taking differently: Take 40 mg by mouth daily before breakfast. 03/13/18   Esterwood, Amy S, PA-C  QUEtiapine (SEROQUEL) 25 MG tablet Take 1 tablet (25 mg total) by mouth at bedtime. 08/06/22   Elby Showers, MD  repaglinide (PRANDIN) 0.5 MG tablet TAKE 1 TABLET(0.5 MG) BY MOUTH TWICE DAILY BEFORE A MEAL Patient taking differently: Take 0.5 mg by mouth See admin instructions. Take 0.5 mg by mouth at 5 pm (AFTER dialysis) only on Mon/Wed/Fri 05/29/22   Elayne Snare, MD  rosuvastatin (CRESTOR) 10 MG tablet Take 1 tablet (10 mg total) by mouth daily. 06/11/22   Eugenie Filler, MD  sevelamer carbonate (RENVELA) 800 MG tablet Take 1,600-2,400 mg by mouth See admin instructions. Take 2,400 mg by mouth three times a day with meals and 1,600 mg with snacks    [provider]  Syringe/Needle, Disp, (SYRINGE 3CC/25GX1") 25G X 1" 3 ML MISC 1 each by Does not apply route every 30 (thirty) days. 04/13/22   Elby Showers, MD  TRADJENTA 5 MG TABS tablet Take 1 tablet (5 mg total) by mouth daily. 11/16/21   Elayne Snare, MD      Allergies    Ambien [zolpidem tartrate] and Penicillins    Review of Systems   Review of Systems  Physical Exam Updated Vital Signs BP (!) 154/65 (BP  Location: Left Arm)   Pulse 71   Temp (!) 97.3 F (36.3 C) (Oral)   Resp 16   SpO2 100%  Physical Exam Vitals and nursing note reviewed.  Constitutional:      General: He is not in acute distress. HENT:     Head: Normocephalic and atraumatic.     Nose: Nose normal.     Mouth/Throat:     Mouth: Mucous membranes are moist.  Eyes:     General: No scleral icterus. Cardiovascular:     Rate and Rhythm: Normal rate and regular rhythm.     Pulses: Normal pulses.     Heart sounds: Normal heart sounds.     Comments: Right arm fistula with  good thrill, bandages in place Pulmonary:     Effort: Pulmonary effort is normal. No respiratory distress.     Breath sounds: No wheezing.  Abdominal:     Palpations: Abdomen is soft.     Tenderness: There is no abdominal tenderness.  Musculoskeletal:     Cervical back: Normal range of motion.     Right lower leg: No edema.     Left lower leg: No edema.     Comments: Tenderness palpation of left hip with some shortening of the leg.  No C-spine tenderness no evidence of head trauma.  Some tenderness of midline T and L-spine.  Bilateral upper extremities without tenderness.  Right lower extremity without tenderness.  Good range of motion.  Sensation intact in all 4 extremities.  Skin:    General: Skin is warm and dry.     Capillary Refill: Capillary refill takes less than 2 seconds.  Neurological:     Mental Status: He is alert. Mental status is at baseline.  Psychiatric:        Mood and Affect: Mood normal.        Behavior: Behavior normal.     ED Results / Procedures / Treatments   Labs (all labs ordered are listed, but only abnormal results are displayed) Labs Reviewed  CBC - Abnormal; Notable for the following components:      Result Value   RBC 3.22 (*)    Hemoglobin 11.3 (*)    HCT 34.0 (*)    MCV 105.6 (*)    MCH 35.1 (*)    RDW 16.0 (*)    Platelets 146 (*)    All other components within normal limits  BASIC METABOLIC PANEL  - Abnormal; Notable for the following components:   Sodium 134 (*)    Potassium 5.3 (*)    Chloride 96 (*)    BUN 28 (*)    Creatinine, Ser 5.54 (*)    GFR, Estimated 10 (*)    All other components within normal limits    EKG EKG Interpretation  Date/Time:  Saturday August 25 2022 21:51:43 EST Ventricular Rate:  73 PR Interval:  192 QRS Duration: 96 QT Interval:  422 QTC Calculation: 465 R Axis:   -37 Text Interpretation: Sinus rhythm Ventricular premature complex Left axis deviation Probable anteroseptal infarct, old Confirmed by Octaviano Glow [phone]) on 08/25/2022 9:58:46 PM  Radiology CT Lumbar Spine Wo Contrast  Result Date: 08/25/2022 CLINICAL DATA:  Fall EXAM: CT THORACIC AND LUMBAR SPINE WITHOUT CONTRAST TECHNIQUE: Multidetector CT imaging of the thoracic and lumbar spine was performed without contrast. Multiplanar CT image reconstructions were also generated. RADIATION DOSE REDUCTION: This exam was performed according to the departmental dose-optimization program which includes automated exposure control, adjustment of the mA and/or kV according to patient size and/or use of iterative reconstruction technique. COMPARISON:  CTA chest 01/12/2021 CT abdomen pelvis 08/17/2022 FINDINGS: CT THORACIC SPINE FINDINGS Alignment: Normal. Vertebrae: Height loss at T1, T3 and T8 is unchanged. No acute fracture. Paraspinal and other soft tissues: Calcific aortic atherosclerosis. Disc levels: No spinal canal stenosis. CT LUMBAR SPINE FINDINGS Segmentation: 5 lumbar type vertebrae. Alignment: Normal. Vertebrae: Unchanged vertebra plana configuration of L1 compared to 08/17/2022. Remote augmentation of L3 with cement extending into the L2-3 disc space. Height loss at the other lumbar levels is also unchanged. Paraspinal and other soft tissues: Calcific aortic atherosclerosis. Infrarenal aortic graft. 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 IMPRESSION: 1. No acute fracture or  static subluxation of the thoracic or lumbar spine. 2. Unchanged vertebra plana configuration of L1 compared to 08/17/2022. 3. Remote augmentation of L3 with cement extending into the L2-3 disc space. 4. 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 5. Chronic compression deformities of T1, T3 and T8 are unchanged since 01/12/2021. Aortic Atherosclerosis (ICD10-I70.0). Electronically Signed   By: Ulyses Jarred M.D.   On: 08/25/2022 20:16   CT Thoracic Spine Wo Contrast  Result Date: 08/25/2022 CLINICAL DATA:  Fall EXAM: CT THORACIC AND LUMBAR SPINE WITHOUT CONTRAST TECHNIQUE: Multidetector CT imaging of the thoracic and lumbar spine was performed without contrast. Multiplanar CT image reconstructions were also generated. RADIATION DOSE REDUCTION: This exam was performed according to the departmental dose-optimization program which includes automated exposure control, adjustment of the mA and/or kV according to patient size and/or use of iterative reconstruction technique. COMPARISON:  CTA chest 01/12/2021 CT abdomen pelvis 08/17/2022 FINDINGS: CT THORACIC SPINE FINDINGS Alignment: Normal. Vertebrae: Height loss at T1, T3 and T8 is unchanged. No acute fracture. Paraspinal and other soft tissues: Calcific aortic atherosclerosis. Disc levels: No spinal canal stenosis. CT LUMBAR SPINE FINDINGS Segmentation: 5 lumbar type vertebrae. Alignment: Normal. Vertebrae: Unchanged vertebra plana configuration of L1 compared to 08/17/2022. Remote augmentation of L3 with cement extending into the L2-3 disc space. Height loss at the other lumbar levels is also unchanged. Paraspinal and other soft tissues: Calcific aortic atherosclerosis. Infrarenal aortic graft. 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 IMPRESSION: 1. No acute fracture or static subluxation of the thoracic or lumbar spine. 2. Unchanged vertebra plana configuration of L1 compared to 08/17/2022. 3. Remote augmentation of L3 with cement extending  into the L2-3 disc space. 4. 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 5. Chronic compression deformities of T1, T3 and T8 are unchanged since 01/12/2021. Aortic Atherosclerosis (ICD10-I70.0). Electronically Signed   By: Ulyses Jarred M.D.   On: 08/25/2022 20:16   DG Hip Unilat W or Wo Pelvis 2-3 Views Left  Result Date: 08/25/2022 CLINICAL DATA:  Left hip pain. EXAM: DG HIP (WITH OR WITHOUT PELVIS) 3V LEFT COMPARISON:  Left hip radiographs 11/03/2021. FINDINGS: A minimally displaced intratrochanteric fracture is present in the proximal left femur. The hip joint is located. Extensive vascular calcifications are present. Right hip ORIF noted. Bowel gas pattern is normal. IMPRESSION: 1. Minimally displaced intratrochanteric fracture of the proximal left femur. 2. Extensive vascular calcifications. Electronically Signed   By: San Morelle M.D.   On: 08/25/2022 19:34    Procedures .Critical Care  Performed by: Tedd Sias, PA Authorized by: Tedd Sias, PA   Critical care provider statement:    Critical care time (minutes):  35   Critical care time was exclusive of:  Separately billable procedures and treating other patients and teaching time   Critical care was necessary to treat or prevent imminent or life-threatening deterioration of the following conditions:  Metabolic crisis   Critical care was time spent personally by me on the following activities:  Development of treatment plan with patient or surrogate, review of old charts, re-evaluation of patient's condition, pulse oximetry, ordering and review of radiographic studies, ordering and review of laboratory studies, ordering and performing treatments and interventions, obtaining history from patient or surrogate, examination of patient and evaluation of patient's response to treatment   Care discussed with: admitting provider       Medications Ordered in ED Medications  fentaNYL (SUBLIMAZE) injection 50 mcg (has no  administration in  time range)  sodium zirconium cyclosilicate (LOKELMA) packet 10 g (has no administration in time range)  heparin injection 5,000 Units (has no administration in time range)    ED Course/ Medical Decision Making/ A&P Clinical Course as of 08/25/22 2213  Sat Aug 25, 2022  1819 Uses walker.  No BT Oriented to self and place.   Fell from standing.   No orhtopedist  Full dialysis Friday  [WF]  1832 This is an 54-year male presenting by EMS after a fall which was mechanical onto his left hip, complaining of pain in his left hip as well as his lower back.  The patient has a history of dementia, he is also on dialysis.  On exam he has some limited range of motion specifically of the left hip due to pain, no visible deformity There is eversion of the left hip.  Do not see evidence of head injury.  He is not a reliable historian.  Patient is pending x-ray and CT imaging of lower back.  His wife witnessed fall and confirmed no head injury. He was given a small dose of fentanyl by EMS, mentating well at this time. [MT]  1909 My interpretation is + for hip fx. In CT for imaging now.  Labs pending.  [WF]  1910 Updated wife:  Radene Journey  [WF]  3846 IMPRESSION: 1. Minimally displaced intratrochanteric fracture of the proximal left femur. 2. Extensive vascular calcifications.   [WF]  1945 Dr. Mable Fill [WF]  2053 Discussed with Dr. Quentin Mulling who recommends lokelma and will dialyze tomorrow [WF]  2210 IMPRESSION: 1. No acute fracture or static subluxation of the thoracic or lumbar spine. 2. Unchanged vertebra plana configuration of L1 compared to 08/17/2022. 3. Remote augmentation of L3 with cement extending into the L2-3 disc space. 4. 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 5. Chronic compression deformities of T1, T3 and T8 are unchanged   [WF]    Clinical Course User Index [MT] Trifan, Carola Rhine, MD [WF] Tedd Sias, [place]                           Medical  Decision Making Amount and/or Complexity of Data Reviewed Labs: ordered. Radiology: ordered.  Risk Prescription drug management. Decision regarding hospitalization.   This patient presents to the ED for concern of left hip pain, fall, this involves a number of treatment options, and is a complaint that carries with it a moderate risk of complications and morbidity. 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  He follows with Dr. Melony Overly of orthopedist and has a history of a right hip fracture status post repair.  He uses his walker to get around.  Seems that he slipped out of a chair and fell onto his left side and has pain to his left hip.  He rates it 10/10 was given a small amount of fentanyl by EMS and states that it is achy and painful but not severely so.  He denies any chest pain difficulty breathing.  No head injury or loss of consciousness this was witnessed by his wife who I discussed the case with.  He is not on any anticoagulation  Per wife patient is at his mental baseline which is primarily oriented to self and events and place but not oriented to date, time, year and has significant memory issues.    EMR reviewed including pt PMHx, past surgical history and past visits to ER.   See HPI for more details   Lab Tests:   I ordered and independently interpreted labs. Labs notable for Hyperkalemia 5.3, mild hyponatremia creatinine and BUN mildly elevated consistent with CKD.  CBC with anemia at baseline.  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 I personally reviewed all  imaging studies. Imaging notable for  Minimally displaced intertrochanteric fracture left femur.  CT L and T-spine without acute fractures  Cardiac Monitoring:  The patient was maintained on a cardiac monitor.  I personally viewed and interpreted the cardiac monitored which showed an underlying rhythm of: NSR EKG non-ischemic normal intervals, single PVC.   Medicines ordered:  I ordered medication including Lokelma, fentanyl for hyperkalemia and pain Reevaluation of the patient after these medicines showed that the patient  is approximately the same.  These medications have not been administered I have reviewed the patients home medicines and have made adjustments as needed   Critical Interventions:     Consults/Attending Physician   I requested consultation with Dr. Mable Fill of orthopedic service,  and discussed lab and imaging findings as well as pertinent plan - they recommend: Admission to hospitalist  Discussed with Dr. Soyla Murphy of nephrology will dialyze in the morning  Discussed with Dr. Francia Greaves of hospitalist service who will admit  Reevaluation:  After the interventions noted above I re-evaluated patient and found that they have :stayed the same   Social Determinants of Health:      Problem List / ED Course:  Left hip fracture after fall. Hyperkalemia nephrology aware of and will dialyze in the morning.  Lokelma ordered   Dispostion:  After consideration of the diagnostic results and the patients response to treatment, I feel that the patent would benefit from admission   Final Clinical Impression(s) / ED Diagnoses Final diagnoses:  S/p left hip fracture  Hyperkalemia    Rx / DC Orders ED Discharge Orders     None         Tedd Sias, [place] 08/25/22 2214    Wyvonnia Dusky, MD 08/26/22 [phone]

## 2022-08-25 NOTE — ED Triage Notes (Signed)
Pt BIB [facility] from home d/t a witnessed mechanical fall slipping & falling onto his Lt side. EMS reports outward rotation of that Lt leg & pain pain rated 10/10. Pt denied hitting his head & there was no LOC. Family reports he is Alert & oriented to baseline (Hx of dementia), is a dialysis pt (Rt arm restrict), no PIV upon arrival. 160/90, 80 bpm, 97% on RA, CBG 121.

## 2022-08-25 NOTE — Progress Notes (Signed)
Patient discussed with EDP.  Imaging reviewed.  L intertroch fx.  - NWB LLE - NPO midnight - Pain control - Preop optimization/risk stratification/clearance - Admit to medicine  Plan for OR 08/26/2022 AM.  Full consult to follow.  Georgeanna Harrison M.D. Orthopaedic Surgery [facility]

## 2022-08-26 ENCOUNTER — Other Ambulatory Visit: Payer: Self-pay

## 2022-08-26 ENCOUNTER — Inpatient Hospital Stay (HOSPITAL_COMMUNITY): Payer: Medicare Other | Admitting: Certified Registered Nurse Anesthetist

## 2022-08-26 ENCOUNTER — Inpatient Hospital Stay (HOSPITAL_COMMUNITY): Payer: Medicare Other

## 2022-08-26 ENCOUNTER — Encounter (HOSPITAL_COMMUNITY)
Admission: EM | Disposition: A | Discharge status: Skilled Nursing Facility | Payer: Self-pay | Source: Home / Self Care | Attending: Internal Medicine

## 2022-08-26 ENCOUNTER — Encounter (HOSPITAL_COMMUNITY): Payer: Self-pay | Admitting: Internal Medicine

## 2022-08-26 DIAGNOSIS — S72142A Displaced intertrochanteric fracture of left femur, initial encounter for closed fracture: Secondary | ICD-10-CM

## 2022-08-26 DIAGNOSIS — D638 Anemia in other chronic diseases classified elsewhere: Secondary | ICD-10-CM

## 2022-08-26 DIAGNOSIS — Z8781 Personal history of (healed) traumatic fracture: Secondary | ICD-10-CM | POA: Diagnosis not present

## 2022-08-26 DIAGNOSIS — E1151 Type 2 diabetes mellitus with diabetic peripheral angiopathy without gangrene: Secondary | ICD-10-CM | POA: Diagnosis not present

## 2022-08-26 DIAGNOSIS — E039 Hypothyroidism, unspecified: Secondary | ICD-10-CM

## 2022-08-26 DIAGNOSIS — G4730 Sleep apnea, unspecified: Secondary | ICD-10-CM | POA: Diagnosis not present

## 2022-08-26 HISTORY — PX: INTRAMEDULLARY (IM) NAIL INTERTROCHANTERIC: SHX5875

## 2022-08-26 LAB — SURGICAL PCR SCREEN
MRSA, PCR: NEGATIVE
Staphylococcus aureus: NEGATIVE

## 2022-08-26 LAB — RENAL FUNCTION PANEL
Albumin: 2.8 g/dL — ABNORMAL LOW (ref 3.5–5.0)
Anion gap: 14 (ref 5–15)
BUN: 34 mg/dL — ABNORMAL HIGH (ref 8–23)
CO2: 28 mmol/L (ref 22–32)
Calcium: 9.9 mg/dL (ref 8.9–10.3)
Chloride: 92 mmol/L — ABNORMAL LOW (ref 98–111)
Creatinine, Ser: 6.1 mg/dL — ABNORMAL HIGH (ref 0.61–1.24)
GFR, Estimated: 9 mL/min — ABNORMAL LOW (ref >60)
Glucose, Bld: 99 mg/dL (ref 70–99)
Phosphorus: 5.6 mg/dL — ABNORMAL HIGH (ref 2.5–4.6)
Potassium: 5.1 mmol/L (ref 3.5–5.1)
Sodium: 134 mmol/L — ABNORMAL LOW (ref 135–145)

## 2022-08-26 LAB — CBC
HCT: 29.7 % — ABNORMAL LOW (ref 39.0–52.0)
Hemoglobin: 10.2 g/dL — ABNORMAL LOW (ref 13.0–17.0)
MCH: 34.3 pg — ABNORMAL HIGH (ref 26.0–34.0)
MCHC: 34.3 g/dL (ref 30.0–36.0)
MCV: 100 fL (ref 80.0–100.0)
Platelets: 144 10*3/uL — ABNORMAL LOW (ref 150–400)
RBC: 2.97 MIL/uL — ABNORMAL LOW (ref 4.22–5.81)
RDW: 15.6 % — ABNORMAL HIGH (ref 11.5–15.5)
WBC: 8.4 10*3/uL (ref 4.0–10.5)
nRBC: 0 % (ref 0.0–0.2)

## 2022-08-26 LAB — GLUCOSE, CAPILLARY
Glucose-Capillary: 82 mg/dL (ref 70–99)
Glucose-Capillary: 110 mg/dL — ABNORMAL HIGH (ref 70–99)
Glucose-Capillary: 174 mg/dL — ABNORMAL HIGH (ref 70–99)
Glucose-Capillary: 125 mg/dL — ABNORMAL HIGH (ref 70–99)

## 2022-08-26 LAB — MAGNESIUM: Magnesium: 2.3 mg/dL (ref 1.7–2.4)

## 2022-08-26 LAB — CBG MONITORING, ED: Glucose-Capillary: 91 mg/dL (ref 70–99)

## 2022-08-26 SURGERY — FIXATION, FRACTURE, INTERTROCHANTERIC, WITH INTRAMEDULLARY ROD
Anesthesia: General | Laterality: Left

## 2022-08-26 MED ORDER — FENTANYL CITRATE (PF) 250 MCG/5ML IJ SOLN
INTRAMUSCULAR | Status: DC | PRN
  Administered 2022-08-26 (×2): 25 ug via INTRAVENOUS
  Administered 2022-08-26: 50 ug via INTRAVENOUS

## 2022-08-26 MED ORDER — VANCOMYCIN HCL 1000 MG IV SOLR
INTRAVENOUS | Status: AC
  Filled 2022-08-26: qty 20

## 2022-08-26 MED ORDER — VANCOMYCIN HCL 1000 MG IV SOLR
INTRAVENOUS | Status: DC | PRN
  Administered 2022-08-26: 1000 mg via TOPICAL

## 2022-08-26 MED ORDER — CEFAZOLIN SODIUM-DEXTROSE 2-4 GM/100ML-% IV SOLN
2.0000 g | INTRAVENOUS | Status: AC
  Administered 2022-08-26: 2 g via INTRAVENOUS

## 2022-08-26 MED ORDER — ONDANSETRON HCL 4 MG/2ML IJ SOLN
INTRAMUSCULAR | Status: AC
  Filled 2022-08-26: qty 2

## 2022-08-26 MED ORDER — TRANEXAMIC ACID-NACL 1000-0.7 MG/100ML-% IV SOLN
INTRAVENOUS | Status: AC
  Filled 2022-08-26: qty 100

## 2022-08-26 MED ORDER — CHLORHEXIDINE GLUCONATE CLOTH 2 % EX PADS
6.0000 | MEDICATED_PAD | Freq: Every day | CUTANEOUS | Status: DC
  Administered 2022-08-27 – 2022-08-30 (×4): 6 via TOPICAL

## 2022-08-26 MED ORDER — ONDANSETRON HCL 4 MG/2ML IJ SOLN
INTRAMUSCULAR | Status: DC | PRN
  Administered 2022-08-26: 4 mg via INTRAVENOUS

## 2022-08-26 MED ORDER — CHLORHEXIDINE GLUCONATE 4 % EX LIQD: 60.0000 mL | Freq: Once | CUTANEOUS | Status: DC

## 2022-08-26 MED ORDER — FENTANYL CITRATE (PF) 100 MCG/2ML IJ SOLN: 25.0000 ug | INTRAMUSCULAR | Status: DC | PRN

## 2022-08-26 MED ORDER — PHENYLEPHRINE 80 MCG/ML (10ML) SYRINGE FOR IV PUSH (FOR BLOOD PRESSURE SUPPORT)
PREFILLED_SYRINGE | INTRAVENOUS | Status: DC | PRN
  Administered 2022-08-26: 80 ug via INTRAVENOUS
  Administered 2022-08-26: 160 ug via INTRAVENOUS
  Administered 2022-08-26: 80 ug via INTRAVENOUS
  Administered 2022-08-26: 160 ug via INTRAVENOUS
  Administered 2022-08-26: 80 ug via INTRAVENOUS
  Administered 2022-08-26: 160 ug via INTRAVENOUS
  Administered 2022-08-26: 80 ug via INTRAVENOUS

## 2022-08-26 MED ORDER — INSULIN ASPART 100 UNIT/ML IJ SOLN
0.0000 [IU] | Freq: Three times a day (TID) | INTRAMUSCULAR | Status: DC
  Administered 2022-08-26 – 2022-08-28 (×2): 1 [IU] via SUBCUTANEOUS

## 2022-08-26 MED ORDER — ALLOPURINOL 100 MG PO TABS
100.0000 mg | ORAL_TABLET | Freq: Every day | ORAL | Status: DC
  Administered 2022-08-27 – 2022-08-30 (×4): 100 mg via ORAL
  Filled 2022-08-26 (×4): qty 1

## 2022-08-26 MED ORDER — LIDOCAINE 2% (20 MG/ML) 5 ML SYRINGE
INTRAMUSCULAR | Status: AC
  Filled 2022-08-26: qty 5

## 2022-08-26 MED ORDER — EPHEDRINE 5 MG/ML INJ
INTRAVENOUS | Status: AC
  Filled 2022-08-26: qty 5

## 2022-08-26 MED ORDER — HYDROCODONE-ACETAMINOPHEN 7.5-325 MG PO TABS: 1.0000 | ORAL_TABLET | ORAL | Status: DC | PRN

## 2022-08-26 MED ORDER — POVIDONE-IODINE 10 % EX SWAB: 2.0000 | Freq: Once | CUTANEOUS | Status: DC

## 2022-08-26 MED ORDER — DOXERCALCIFEROL 4 MCG/2ML IV SOLN
5.0000 ug | INTRAVENOUS | Status: DC
  Administered 2022-08-27 – 2022-08-29 (×2): 5 ug via INTRAVENOUS
  Filled 2022-08-26 (×4): qty 4

## 2022-08-26 MED ORDER — ACETAMINOPHEN 325 MG PO TABS
325.0000 mg | ORAL_TABLET | Freq: Four times a day (QID) | ORAL | Status: DC | PRN
  Administered 2022-08-28: 325 mg via ORAL

## 2022-08-26 MED ORDER — METOCLOPRAMIDE HCL 5 MG PO TABS: 5.0000 mg | ORAL_TABLET | Freq: Three times a day (TID) | ORAL | Status: DC | PRN

## 2022-08-26 MED ORDER — METOCLOPRAMIDE HCL 5 MG/ML IJ SOLN: 5.0000 mg | Freq: Three times a day (TID) | INTRAMUSCULAR | Status: DC | PRN

## 2022-08-26 MED ORDER — CEFAZOLIN SODIUM-DEXTROSE 2-4 GM/100ML-% IV SOLN
INTRAVENOUS | Status: AC
  Filled 2022-08-26: qty 100

## 2022-08-26 MED ORDER — PHENOL 1.4 % MT LIQD: 1.0000 | OROMUCOSAL | Status: DC | PRN

## 2022-08-26 MED ORDER — TRANEXAMIC ACID-NACL 1000-0.7 MG/100ML-% IV SOLN
1000.0000 mg | INTRAVENOUS | Status: AC
  Administered 2022-08-26: 1000 mg via INTRAVENOUS

## 2022-08-26 MED ORDER — ACETAMINOPHEN 500 MG PO TABS
500.0000 mg | ORAL_TABLET | Freq: Four times a day (QID) | ORAL | Status: AC
  Administered 2022-08-27 (×2): 500 mg via ORAL
  Filled 2022-08-26 (×4): qty 1

## 2022-08-26 MED ORDER — DONEPEZIL HCL 10 MG PO TABS
10.0000 mg | ORAL_TABLET | Freq: Every day | ORAL | Status: DC
  Administered 2022-08-26 – 2022-08-29 (×4): 10 mg via ORAL
  Filled 2022-08-26 (×4): qty 1

## 2022-08-26 MED ORDER — PHENYLEPHRINE 80 MCG/ML (10ML) SYRINGE FOR IV PUSH (FOR BLOOD PRESSURE SUPPORT)
PREFILLED_SYRINGE | INTRAVENOUS | Status: AC
  Filled 2022-08-26: qty 10

## 2022-08-26 MED ORDER — SUGAMMADEX SODIUM 200 MG/2ML IV SOLN
INTRAVENOUS | Status: DC | PRN
  Administered 2022-08-26 (×2): 100 mg via INTRAVENOUS

## 2022-08-26 MED ORDER — ROCURONIUM BROMIDE 10 MG/ML (PF) SYRINGE
PREFILLED_SYRINGE | INTRAVENOUS | Status: DC | PRN
  Administered 2022-08-26: 60 mg via INTRAVENOUS

## 2022-08-26 MED ORDER — INSULIN ASPART 100 UNIT/ML IJ SOLN: 0.0000 [IU] | INTRAMUSCULAR | Status: DC | PRN

## 2022-08-26 MED ORDER — DOCUSATE SODIUM 100 MG PO CAPS
100.0000 mg | ORAL_CAPSULE | Freq: Two times a day (BID) | ORAL | Status: DC
  Administered 2022-08-26 – 2022-08-29 (×7): 100 mg via ORAL
  Filled 2022-08-26 (×7): qty 1

## 2022-08-26 MED ORDER — PHENYLEPHRINE HCL-NACL 20-0.9 MG/250ML-% IV SOLN
INTRAVENOUS | Status: DC | PRN
  Administered 2022-08-26: 35 ug/min via INTRAVENOUS
  Administered 2022-08-26: 60 ug/min via INTRAVENOUS

## 2022-08-26 MED ORDER — FENTANYL CITRATE (PF) 250 MCG/5ML IJ SOLN
INTRAMUSCULAR | Status: AC
  Filled 2022-08-26: qty 5

## 2022-08-26 MED ORDER — CEFAZOLIN SODIUM-DEXTROSE 1-4 GM/50ML-% IV SOLN
1.0000 g | INTRAVENOUS | Status: AC
  Administered 2022-08-26: 1 g via INTRAVENOUS
  Filled 2022-08-26: qty 50

## 2022-08-26 MED ORDER — CEFAZOLIN SODIUM-DEXTROSE 2-4 GM/100ML-% IV SOLN: 2.0000 g | Freq: Two times a day (BID) | INTRAVENOUS | Status: DC

## 2022-08-26 MED ORDER — ROCURONIUM BROMIDE 10 MG/ML (PF) SYRINGE
PREFILLED_SYRINGE | INTRAVENOUS | Status: AC
  Filled 2022-08-26: qty 10

## 2022-08-26 MED ORDER — PROPOFOL 10 MG/ML IV BOLUS
INTRAVENOUS | Status: DC | PRN
  Administered 2022-08-26: 50 mg via INTRAVENOUS
  Administered 2022-08-26: 10 mg via INTRAVENOUS

## 2022-08-26 MED ORDER — 0.9 % SODIUM CHLORIDE (POUR BTL) OPTIME
TOPICAL | Status: DC | PRN
  Administered 2022-08-26: 1000 mL

## 2022-08-26 MED ORDER — MORPHINE SULFATE (PF) 2 MG/ML IV SOLN: 0.5000 mg | INTRAVENOUS | Status: DC | PRN

## 2022-08-26 MED ORDER — DEXAMETHASONE SODIUM PHOSPHATE 10 MG/ML IJ SOLN
INTRAMUSCULAR | Status: DC | PRN
  Administered 2022-08-26: 4 mg via INTRAVENOUS

## 2022-08-26 MED ORDER — SODIUM CHLORIDE 0.9 % IV SOLN: INTRAVENOUS | Status: DC

## 2022-08-26 MED ORDER — CHLORHEXIDINE GLUCONATE 0.12 % MT SOLN
OROMUCOSAL | Status: AC
  Filled 2022-08-26: qty 15

## 2022-08-26 MED ORDER — MENTHOL 3 MG MT LOZG: 1.0000 | LOZENGE | OROMUCOSAL | Status: DC | PRN

## 2022-08-26 MED ORDER — OXYCODONE-ACETAMINOPHEN 5-325 MG PO TABS: 1.0000 | ORAL_TABLET | Freq: Four times a day (QID) | ORAL | 0 refills | Status: DC | PRN

## 2022-08-26 MED ORDER — HYDROCODONE-ACETAMINOPHEN 5-325 MG PO TABS
1.0000 | ORAL_TABLET | ORAL | Status: DC | PRN
  Administered 2022-08-26 – 2022-08-27 (×2): 1 via ORAL
  Administered 2022-08-29 – 2022-08-30 (×2): 2 via ORAL
  Filled 2022-08-26: qty 2
  Filled 2022-08-26 (×2): qty 1
  Filled 2022-08-26: qty 2

## 2022-08-26 SURGICAL SUPPLY — 47 items
ADH SKN CLS APL DERMABOND .7 (GAUZE/BANDAGES/DRESSINGS) ×2
ADH SKN CLS LQ APL DERMABOND (GAUZE/BANDAGES/DRESSINGS) ×1
APL PRP STRL LF DISP 70% ISPRP (MISCELLANEOUS) ×1
BAG COUNTER SPONGE SURGICOUNT (BAG) ×2 IMPLANT
BAG SPNG CNTER NS LX DISP (BAG) ×1
BIT DRILL CANN 16 HIP (BIT) IMPLANT
BIT DRILL CANN STP 6/9 HIP (BIT) IMPLANT
BIT DRILL LONG 4.2 (BIT) IMPLANT
BNDG COHESIVE 6X5 TAN NS LF (GAUZE/BANDAGES/DRESSINGS) ×2 IMPLANT
CHLORAPREP W/TINT 26 (MISCELLANEOUS) ×2 IMPLANT
CLSR STERI-STRIP ANTIMIC 1/2X4 (GAUZE/BANDAGES/DRESSINGS) IMPLANT
COVER PERINEAL POST (MISCELLANEOUS) ×2 IMPLANT
COVER SURGICAL LIGHT HANDLE (MISCELLANEOUS) ×2 IMPLANT
DERMABOND ADVANCED .7 DNX12 (GAUZE/BANDAGES/DRESSINGS) ×4 IMPLANT
DERMABOND ADVANCED .7 DNX6 (GAUZE/BANDAGES/DRESSINGS) IMPLANT
DRAPE C-ARM 42X72 X-RAY (DRAPES) ×2 IMPLANT
DRAPE C-ARMOR (DRAPES) ×2 IMPLANT
DRAPE STERI IOBAN 125X83 (DRAPES) ×2 IMPLANT
DRSG AQUACEL AG ADV 3.5X 6 (GAUZE/BANDAGES/DRESSINGS) IMPLANT
DRSG MEPILEX BORDER 4X8 (GAUZE/BANDAGES/DRESSINGS) ×4 IMPLANT
ELECT REM PT RETURN 9FT ADLT (ELECTROSURGICAL) ×1
ELECTRODE REM PT RTRN 9FT ADLT (ELECTROSURGICAL) ×2 IMPLANT
GLOVE BIOGEL PI IND STRL 8 (GLOVE) ×4 IMPLANT
GLOVE ECLIPSE 7.5 STRL STRAW (GLOVE) ×4 IMPLANT
GLOVE SURG ORTHO LTX SZ7.5 (GLOVE) ×4 IMPLANT
GOWN STRL REIN XL XLG (GOWN DISPOSABLE) ×2 IMPLANT
GOWN STRL REUS W/ TWL LRG LVL3 (GOWN DISPOSABLE) ×2 IMPLANT
GOWN STRL REUS W/ TWL XL LVL3 (GOWN DISPOSABLE) ×2 IMPLANT
GOWN STRL REUS W/TWL LRG LVL3 (GOWN DISPOSABLE) ×1
GOWN STRL REUS W/TWL XL LVL3 (GOWN DISPOSABLE) ×1
GUIDEWIRE 3.2X400 (WIRE) IMPLANT
KIT BASIN OR (CUSTOM PROCEDURE TRAY) ×2 IMPLANT
MANIFOLD NEPTUNE II (INSTRUMENTS) ×2 IMPLANT
NAIL CANN FEM RT 12X170 130D (Nail) IMPLANT
NS IRRIG 1000ML POUR BTL (IV SOLUTION) ×2 IMPLANT
PACK GENERAL/GYN (CUSTOM PROCEDURE TRAY) ×2 IMPLANT
PAD ARMBOARD 7.5X6 YLW CONV (MISCELLANEOUS) ×4 IMPLANT
SCREW FENES TFNA 110 (Screw) IMPLANT
SCREW LOCK IM TI 5X40 (Screw) IMPLANT
SPONGE T-LAP 18X18 ~~LOC~~+RFID (SPONGE) ×4 IMPLANT
SUT MNCRL AB 3-0 PS2 18 (SUTURE) ×4 IMPLANT
SUT MON AB 2-0 CT1 36 (SUTURE) ×4 IMPLANT
SUT PDS AB 1 CT  36 (SUTURE) ×2
SUT PDS AB 1 CT 36 (SUTURE) ×4 IMPLANT
SUT VIC AB 0 CT1 27 (SUTURE) ×2
SUT VIC AB 0 CT1 27XBRD ANBCTR (SUTURE) ×4 IMPLANT
TOWEL GREEN STERILE (TOWEL DISPOSABLE) ×4 IMPLANT

## 2022-08-26 NOTE — H&P (Signed)
PREOPERATIVE H&P  HPI: [name] is a 81 y.o. male who has presented today for surgery, with the diagnosis of left intertrochanteric hip fracture.  The various methods of treatment have been discussed with the patient and family.  After consideration of risks, benefits, and other options for treatment, the patient has consented to INTRAMEDULLARY (IM) NAIL INTERTROCHANTERIC as a surgical intervention.  The patient's history has been reviewed, patient examined, no change in status, stable for surgery.  I have reviewed the patient's chart and labs.  Questions were answered to the patient's satisfaction.    PMH: Past Medical History:  Diagnosis Date   Allergy    Alzheimer's disease ([place]) 01/19/2021   Anemia    Arthritis    Cataract    bil cateracts removed   Coronary artery disease    Diabetes mellitus    Type 2   Diverticulitis    ED (erectile dysfunction)    Elevated homocysteine    ESRD (end stage renal disease) on dialysis ([place]) 03/2015   M-W-F dialysis   GERD (gastroesophageal reflux disease)    pepto    Gout    Hiatal hernia    Hyperlipidemia    Hypertension    Hypothyroidism    PVD (peripheral vascular disease) ([place])    has plastic aorta   Renal insufficiency    Sleep apnea    does not wear c-pap    Home Medications Allergies  No current facility-administered medications on file prior to encounter.   Current Outpatient Medications on File Prior to Encounter  Medication Sig Dispense Refill   acetaminophen (TYLENOL) 325 MG tablet Take 1-2 tablets (325-650 mg total) by mouth every 4 (four) hours as needed for mild pain. (Patient taking differently: Take 650 mg by mouth in the morning and at bedtime.)     albuterol (VENTOLIN HFA) 108 (90 Base) MCG/ACT inhaler Inhale 1-2 puffs into the lungs daily as needed for wheezing or shortness of breath.     allopurinol (ZYLOPRIM) 100 MG tablet Take 100 mg by mouth daily.     Blood Glucose Monitoring Suppl (FREESTYLE FREEDOM LITE)  w/Device KIT Use to check blood sugar 2 times per day dx code E11.65 1 each 0   butalbital-acetaminophen-caffeine (FIORICET) 50-325-40 MG tablet Take 1 tablet by mouth every 6 (six) hours as needed for headache.     camphor-menthol (SARNA) lotion Apply 1 application topically 4 (four) times daily as needed for itching.      Carboxymethylcellulose Sodium (THERATEARS) 0.25 % SOLN Place 1 drop into both eyes 3 (three) times daily as needed (for dryness).     cetirizine (ZYRTEC) 10 MG tablet Take 10 mg by mouth daily as needed for allergies.     cyanocobalamin (,VITAMIN B-12,) 1000 MCG/ML injection Inject 1 mL (1,000 mcg total) into the muscle every 30 (thirty) days. 10 mL 0   donepezil (ARICEPT) 10 MG tablet Take 1 tablet (10 mg total) by mouth at bedtime. 90 tablet 0   doxercalciferol (HECTOROL) 4 MCG/2ML injection Inject 2.5 mLs (5 mcg total) into the vein every Monday, Wednesday, and Friday with hemodialysis. 2 mL 0   fluticasone (FLONASE) 50 MCG/ACT nasal spray Place 1 spray into both nostrils daily. (Patient taking differently: Place 2 sprays into both nostrils daily as needed for allergies.) 16 g 2   glucose blood (FREESTYLE LITE) test strip USE AS DIRECTED THREE TIMES DAILY 300 strip 2   hydrocortisone 2.5 % cream Apply 1 application topically 3 (three) times daily as needed (  skin irritation (legs & arms)).      lacosamide (VIMPAT) 50 MG TABS tablet Take 1 tablet (50 mg total) by mouth 2 (two) times daily for 5 days. 10 tablet 0   lacosamide 100 MG TABS Take 1 tablet (100 mg total) by mouth 2 (two) times daily. 60 tablet 0   Lancets (FREESTYLE) lancets Use as instructed to check blood sugar 2 times per day dx code E11.65 100 each 3   levothyroxine (SYNTHROID, LEVOTHROID) 50 MCG tablet Take 1 tablet (50 mcg total) by mouth daily. (Patient taking differently: Take 50 mcg by mouth daily before breakfast.) 90 tablet 1   memantine (NAMENDA) 10 MG tablet Take 1 tablet (10 mg total) by mouth 2 (two)  times daily. 180 tablet 0   midodrine (PROAMATINE) 10 MG tablet Take one pill prior to hemodialysis on MWF (Patient taking differently: Take 10 mg by mouth See admin instructions. Take 10 mg by mouth prior to hemodialysis on Mon/Wed/Fri)     Nutritional Supplements (FEEDING SUPPLEMENT, NEPRO CARB STEADY,) LIQD Take 237 mLs by mouth in the morning.     omeprazole (PRILOSEC) 40 MG capsule Take 1 capsule (40 mg total) by mouth daily. (Patient taking differently: Take 40 mg by mouth daily before breakfast.) 30 capsule 0   QUEtiapine (SEROQUEL) 25 MG tablet Take 1 tablet (25 mg total) by mouth at bedtime. 90 tablet 0   repaglinide (PRANDIN) 0.5 MG tablet TAKE 1 TABLET(0.5 MG) BY MOUTH TWICE DAILY BEFORE A MEAL (Patient taking differently: Take 0.5 mg by mouth See admin instructions. Take 0.5 mg by mouth at 5 pm (AFTER dialysis) only on Mon/Wed/Fri) 60 tablet 2   rosuvastatin (CRESTOR) 10 MG tablet Take 1 tablet (10 mg total) by mouth daily. 30 tablet 1   sevelamer carbonate (RENVELA) 800 MG tablet Take 1,600-2,400 mg by mouth See admin instructions. Take 2,400 mg by mouth three times a day with meals and 1,600 mg with snacks     Syringe/Needle, Disp, (SYRINGE 3CC/25GX1") 25G X 1" 3 ML MISC 1 each by Does not apply route every 30 (thirty) days. 50 each 0   TRADJENTA 5 MG TABS tablet Take 1 tablet (5 mg total) by mouth daily. 90 tablet 3   Allergies  Allergen Reactions   Ambien [Zolpidem Tartrate] Other (See Comments)    Hallucinations and "felt crazy"    Penicillins Hives and Rash    Has patient had a PCN reaction causing immediate rash, facial/tongue/throat swelling, SOB or lightheadedness with hypotension: Yes Has patient had a PCN reaction causing severe rash involving mucus membranes or skin necrosis: Yes Has patient had a PCN reaction that required hospitalization: No Has patient had a PCN reaction occurring within the last 10 years: No If all of the above answers are "NO", then may proceed with  Cephalosporin use.      PSH: Past Surgical History:  Procedure Laterality Date   ANGIOPLASTY Right 01/26/2021   Procedure: ANGIOPLASTY RIGHT SUBCLAVIAN VEIN;  Surgeon: Serafina Mitchell, MD;  Location: [place] [facility];  Service: Vascular;  Laterality: Right;   aortobifemoral bypass     AV FISTULA PLACEMENT Left 12/01/2013   Procedure: ARTERIOVENOUS (AV) FISTULA CREATION- LEFT BRACHIOCEPHALIC;  Surgeon: Angelia Mould, MD;  Location: [place];  Service: Vascular;  Laterality: Left;   [place] Right 07/27/2014   Procedure: [place];  Surgeon: Angelia Mould, MD;  Location: [place];  Service: Vascular;  Laterality: Right;   BRAIN SURGERY  07/22/2018  BREAST SURGERY     left - granulomatous mastitis   BURR HOLE Bilateral 07/22/2018   Procedure: BILATERAL BURR HOLES;  Surgeon: Kristeen Miss, MD;  Location: [place];  Service: Neurosurgery;  Laterality: Bilateral;   COLONOSCOPY     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 ([place] [facility])  1992   ENTEROSCOPY N/A 06/03/2018   Procedure: ENTEROSCOPY;  Surgeon: Lavena Bullion, DO;  Location: [facility] END[facility]COPY;  Service: Gastroenterology;  Laterality: N/A;   ENTEROSCOPY N/A 11/20/2018   Procedure: ENTEROSCOPY;  Surgeon: Rush Landmark Telford Nab., MD;  Location: [place];  Service: Gastroenterology;  Laterality: N/A;   ENTEROSCOPY N/A 12/03/2019   Procedure: ENTEROSCOPY;  Surgeon: Lavena Bullion, DO;  Location: [facility] ENDOSCOPY;  Service: Gastroenterology;  Laterality: N/A;  push enteroscopy   ENTEROSCOPY N/A 07/04/2021   Procedure: ENTEROSCOPY;  Surgeon: Sharyn Creamer, MD;  Location: [facility] ENDOSCOPY;  Service: Gastroenterology;  Laterality: N/A;   ENTEROSCOPY N/A 11/05/2021   Procedure: ENTEROSCOPY;  Surgeon: Daryel November, MD;  Location: [facility] ENDOSCOPY;  Service: Gastroenterology;  Laterality: N/A;   EYE SURGERY Bilateral    cataracts   FISTULOGRAM Right 01/26/2021   Procedure: FISTULOGRAM RIGHT;  Surgeon: Serafina Mitchell, MD;   Location: [facility] OR;  Service: Vascular;  Laterality: Right;   HEMODIALYSIS INPATIENT  01/17/2018       HOT HEMOSTASIS N/A 06/03/2018   Procedure: HOT HEMOSTASIS (ARGON PLASMA COAGULATION/BICAP);  Surgeon: Lavena Bullion, DO;  Location[facility] END[facility]COPY;  Service: Gastroenterology;  Laterality: N/A;   HOT HEMOSTASIS N/A 11/20/2018   Procedure: HOT HEMOSTASIS (ARGON PLASMA COAGULATION/BICAP);  Surgeon: Irving Copas., MD;  Location: [place];  Service: Gastroenterology;  Laterality: N/A;   HOT HEMOSTASIS N/A 12/03/2019   Procedure: HOT HEMOSTASIS (ARGON PLASMA COAGULATION/BICAP);  Surgeon: Lavena Bullion, DO;  Location: [facility] ENDOSCOPY;  Service: Gastroenterology;  Laterality: N/A;   INTRAMEDULLARY (IM) NAIL INTERTROCHANTERIC Right 11/08/2020   Procedure: INTRAMEDULLARY (IM) NAIL INTERTROCHANTRIC;  Surgeon: Erle Crocker, MD;  Location: [place];  Service: Orthopedics;  Laterality: Right;   REVISION OF ARTERIOVENOUS GORETEX GRAFT Right 09/01/2019   Procedure: REVISION OF ARTERIOVENOUS FISTULA RIGHT ARM;  Surgeon: Serafina Mitchell, MD;  Location: [place];  Service: Vascular;  Laterality: Right;   REVISON OF ARTERIOVENOUS FISTULA Left 02/09/2014   Procedure: REVISON OF LEFT ARTERIOVENOUS FISTULA - RESECTION OF RENDUNDANT VEIN;  Surgeon: Angelia Mould, MD;  Location: [place];  Service: Vascular;  Laterality: Left;   REVISON OF ARTERIOVENOUS FISTULA Right 01/26/2021   Procedure: REVISON OF ARTERIOVENOUS FISTULA RIGHT;  Surgeon: Serafina Mitchell, MD;  Location: [place];  Service: Vascular;  Laterality: Right;   SBO with lysis adhesions     SHUNTOGRAM Left 04/19/2014   Procedure: FISTULOGRAM;  Surgeon: Angelia Mould, MD;  Location: [facility] CATH LAB;  Service: Cardiovascular;  Laterality: Left;   UNILATERAL UPPER EXTREMEITY ANGIOGRAM N/A 07/12/2014   Procedure: UNILATERAL UPPER Anselmo Rod;  Surgeon: Angelia Mould, MD;  Location: [facility] CATH LAB;  Service: Cardiovascular;   Laterality: N/A;     Family History Social History  Family History  Problem Relation Age of Onset   Aneurysm Mother        brain   Heart disease Father    Stroke Father    Hypertension Father    Diabetes Father    Dementia Neg Hx    Colon cancer Neg Hx    Esophageal cancer Neg Hx    Pancreatic cancer Neg Hx    Prostate  cancer Neg Hx    Rectal cancer Neg Hx    Stomach cancer Neg Hx     Social History   Socioeconomic History   Marital status: Married    Spouse name: [name] Mood   Number of children: 1   Years of education: 16   Highest education level: Not on file  Occupational History   Occupation: Retired  Tobacco Use   Smoking status: Former    Types: Cigarettes    Quit date: 09/24/1994    Years since quitting: 27.9    Passive exposure: Never   Smokeless tobacco: Never  Vaping Use   Vaping Use: Never used  Substance and Sexual Activity   Alcohol use: No    Alcohol/week: 0.0 standard drinks of alcohol    Comment: Quit Oct. 1977 ("somewhat heavy")   Drug use: No   Sexual activity: Not on file  Other Topics Concern   Not on file  Social History Narrative   Lives at home with wife.   Caffeine use: Drinks no tea, "a little soda"   Drinks 1 cup coffee/week   Right handed   VETERAN   Social Determinants of Health   Financial Resource Strain: Not on file  Food Insecurity: No Food Insecurity (06/25/2022)   Hunger Vital Sign    Worried About Running Out of Food in the Last Year: Never true    Ran Out of Food in the Last Year: Never true  Transportation Needs: No Transportation Needs (06/25/2022)   PRAPARE - Hydrologist (Medical): No    Lack of Transportation (Non-Medical): No  Physical Activity: Insufficiently Active (07/30/2017)   Exercise Vital Sign    Days of Exercise per Week: 3 days    Minutes of Exercise per Session: 10 min  Stress: No Stress Concern Present (07/30/2017)   [place]    Feeling of Stress : Not at all  Social Connections: Not on file     Review of Systems: MSK: As noted per HPI above GI: No current Nausea/vomiting ENT: Denies sore throat, epistaxis CV: Denies chest pain Resp: No current shortness of breath  Other than mentioned above, there are no Constitutional, Neurological, Psychiatric, ENT, Ophthalmological, Cardiovascular, Respiratory, GI, GU, Musculoskeletal, Integumentary, Lymphatic, Endocrine or Allergic issues.   Physical Examination: CV: Normal distal pulses Lungs: Unlabored respirations LUE:  Inspection: Held in external rotated position Palpation: Tender to palpation at hip and at site of known fracture ROM: Hip range of motion severely limited due to pain Strength: Demonstrates intact dorsiflexion, plantarflexion, EHL Sensation: Intact light touch distally superficial peroneal, deep peroneal, tibial distributions Skin: Intact Peripheral Vascular: Normal DP pulse, warm well-perfused distally  Assessment/Plan: INTRAMEDULLARY (IM) NAIL INTERTROCHANTERIC    Georgeanna Harrison M.D. Orthopaedic Surgery [facility]  Review of this patient's medications prescribed by other providers does not in any way constitute an endorsement by this clinician of their use, indications, dosage, route, efficacy, interactions, or other clinical parameters.  Portions of the record have been created with voice recognition software.  Grammatical and punctuation errors, random word insertions, wrong-word or "sound-a-like" substitutions, pronoun errors (inaccuracies and/or substitutions), and/or incomplete sentences may have occurred due to the inherent limitations of voice recognition software.  Not all errors are caught or corrected.  Although every attempt is made to root out erroneous and incomplete transcription, the note may still not fully represent the intent or opinion of the author.  Read the  chart  carefully and recognize, using context, where errors/substitutions have occurred.  Any questions or concerns about the content of this note or information contained within the body of this dictation should be addressed directly with the author for clarification.

## 2022-08-26 NOTE — Anesthesia Preprocedure Evaluation (Addendum)
Anesthesia Evaluation  Patient identified by MRN, date of birth, ID band Patient confused    Reviewed: Allergy & Precautions, H&P , NPO status , Patient's Chart, lab work & pertinent test results  Airway Mallampati: III  TM Distance: >3 FB Neck ROM: Full    Dental no notable dental hx. (+) Teeth Intact, Dental Advisory Given   Pulmonary sleep apnea , former smoker   Pulmonary exam normal breath sounds clear to auscultation       Cardiovascular hypertension, + CAD, + Peripheral Vascular Disease and +CHF   Rhythm:Regular Rate:Normal     Neuro/Psych  Headaches   Depression   Dementia  negative psych ROS   GI/Hepatic Neg liver ROS, hiatal hernia,GERD  Medicated,,  Endo/Other  diabetesHypothyroidism    Renal/GU ESRF and DialysisRenal disease  negative genitourinary   Musculoskeletal  (+) Arthritis , Osteoarthritis,    Abdominal   Peds  Hematology  (+) Blood dyscrasia, anemia   Anesthesia Other Findings   Reproductive/Obstetrics negative OB ROS                             Anesthesia Physical Anesthesia Plan  ASA: 3  Anesthesia Plan: General   Post-op Pain Management: Ofirmev IV (intra-op)*   Induction: Intravenous  PONV Risk Score and Plan: 3 and Ondansetron, Dexamethasone and Treatment may vary due to age or medical condition  Airway Management Planned: Oral ETT  Additional Equipment:   Intra-op Plan:   Post-operative Plan: Extubation in OR  Informed Consent: I have reviewed the patients History and Physical, chart, labs and discussed the procedure including the risks, benefits and alternatives for the proposed anesthesia with the patient or authorized representative who has indicated his/her understanding and acceptance.   Patient has DNR.  Discussed DNR with power of attorney and Continue DNR.   Dental advisory given and Consent reviewed with POA  Plan Discussed with:  CRNA  Anesthesia Plan Comments:        Anesthesia Quick Evaluation

## 2022-08-26 NOTE — Progress Notes (Signed)
Family not present when pt. Arrived to short stay. Received telephone consent for surgery from wife, [name]. Mrs. Cosman stated pt. Has a DNR. She spoke with Dr. Jefm Petty. Mrs. Reinhardt stated she is coming to the hospital at 2 pm today and will bring the DNR paper.

## 2022-08-26 NOTE — Progress Notes (Signed)
PROGRESS NOTE    [name]  SWF:[id number] DOB: 1940/11/07 DOA: 08/25/2022 PCP: Elby Showers, MD   Chief Complaint  Patient presents with   Fall   Lt hip pain    Brief Narrative:    [name] is a 81 y.o. male with medical history significant for Alzheimer's dementia, ESRD on HD TTS, hypertension, type 2 diabetes, coronary artery disease, peripheral vascular disease, who presented to [facility] ED after a fall at home.  He was walking to the bathroom when he fell and landed on his left hip.  He could not bear weight on his left hip after the fall.  EMS was activated and he was brought to the ED for further evaluation.  He was found to have left hip fracture, and he is admitted for further workup.    Assessment & Plan:   Principal Problem:   S/p left hip fracture  Fall status post left hip fracture, POA Pain control and bowel regimen Orthopedic consult greatly appreciated, plan for surgical repair today PT/OT to evaluate after surgery   ESRD on HD TTS -Nephrology consulted.  Hyperkalemia Serum potassium 5.3 on admission, received Lokelma, it is 5.1 this morning.   Hypertension BPs are currently soft, actually he is on midodrine.   Hypothyroidism Resume home levothyroxine   Dementia/mood disorder Resume home regimen. Reorient as needed Fall precautions Delirium precautions   Diabetes mellitus -Will hold Prandin and Tradjenta and keep an insulin sliding scale during hospital stay.   DVT prophylaxis: Joseph heparin Code Status: Full code Family Communication: None at bedside Disposition:   Status is: Inpatient    Consultants:  Orthopedic Renal   Subjective:  Patient complaining of pain at left hip  Objective: Vitals:   08/26/22 0401 08/26/22 0430 08/26/22 0545 08/26/22 0700  BP: 96/76 94/66 104/68 132/63  Pulse: 72 81 75 72  Resp:  '15 16 12  '$ Temp:   99.1 F (37.3 C)   TempSrc:   Oral   SpO2: 97% 100% 96% 100%   No intake or output data in the  24 hours ending 08/26/22 0850 There were no vitals filed for this visit.  Examination:  Awake Alert, in mild discomfort due to pain, frail. Symmetrical Chest wall movement, Good air movement bilaterally, CTAB RRR,No Gallops,Rubs or new Murmurs, No Parasternal Heave +ve B.Sounds, Abd Soft, No tenderness, No rebound - guarding or rigidity. No Cyanosis, Clubbing or edema, No new Rash or bruise      Data Reviewed: I have personally reviewed following labs and imaging studies  CBC: Recent Labs  Lab 08/25/22 1935 08/26/22 0455  WBC 8.2 8.4  HGB 11.3* 10.2*  HCT 34.0* 29.7*  MCV 105.6* 100.0  PLT 146* 144*    Basic Metabolic Panel: Recent Labs  Lab 08/25/22 1935 08/26/22 0455  NA 134* 134*  K 5.3* 5.1  CL 96* 92*  CO2 24 28  GLUCOSE 86 99  BUN 28* 34*  CREATININE 5.54* 6.10*  CALCIUM 10.0 9.9  MG  --  2.3  PHOS  --  5.6*    GFR: Estimated Creatinine Clearance: 8.6 mL/min (A) (by C-G formula based on SCr of 6.1 mg/dL (H)).  Liver Function Tests: Recent Labs  Lab 08/26/22 0455  ALBUMIN 2.8*    CBG: Recent Labs  Lab 08/19/22 1528 08/26/22 0444  GLUCAP 82 91     Recent Results (from the past 240 hour(s))  Resp Panel by RT-PCR (Flu A&B, Covid) Anterior Nasal Swab  Status: None   Collection Time: 08/17/22  7:34 PM   Specimen: Anterior Nasal Swab  Result Value Ref Range Status   SARS Coronavirus 2 by RT PCR NEGATIVE NEGATIVE Final    Comment: (NOTE) SARS-CoV-2 target nucleic acids are NOT DETECTED.  The SARS-CoV-2 RNA is generally detectable in upper respiratory specimens during the acute phase of infection. The lowest concentration of SARS-CoV-2 viral copies this assay can detect is 138 copies/mL. A negative result does not preclude SARS-Cov-2 infection and should not be used as the sole basis for treatment or other patient management decisions. A negative result may occur with  improper specimen collection/handling, submission of specimen  other than nasopharyngeal swab, presence of viral mutation(s) within the areas targeted by this assay, and inadequate number of viral copies(<138 copies/mL). A negative result must be combined with clinical observations, patient history, and epidemiological information. The expected result is Negative.  Fact Sheet for Patients:  EntrepreneurPulse.com.au  Fact Sheet for Healthcare Providers:  IncredibleEmployment.be  This test is no t yet approved or cleared by the [place] FDA and  has been authorized for detection and/or diagnosis of SARS-CoV-2 by FDA under an Emergency Use Authorization (EUA). This EUA will remain  in effect (meaning this test can be used) for the duration of the COVID-19 declaration under Section 564(b)(1) of the Act, 21 U.S.C.section 360bbb-3(b)(1), unless the authorization is terminated  or revoked sooner.       Influenza A by PCR NEGATIVE NEGATIVE Final   Influenza B by PCR NEGATIVE NEGATIVE Final    Comment: (NOTE) The Xpert Xpress SARS-CoV-2/FLU/RSV plus assay is intended as an aid in the diagnosis of influenza from Nasopharyngeal swab specimens and should not be used as a sole basis for treatment. Nasal washings and aspirates are unacceptable for Xpert Xpress SARS-CoV-2/FLU/RSV testing.  Fact Sheet for Patients: EntrepreneurPulse.com.au  Fact Sheet for Healthcare Providers: IncredibleEmployment.be  This test is not yet approved or cleared by the [place] FDA and has been authorized for detection and/or diagnosis of SARS-CoV-2 by FDA under an Emergency Use Authorization (EUA). This EUA will remain in effect (meaning this test can be used) for the duration of the COVID-19 declaration under Section 564(b)(1) of the Act, 21 U.S.C. section 360bbb-3(b)(1), unless the authorization is terminated or revoked.  Performed at [facility], [place] [address], [place],  [place] [address]          Radiology Studies: CT Lumbar Spine Wo Contrast  Result Date: 08/25/2022 CLINICAL DATA:  Fall EXAM: CT THORACIC AND LUMBAR SPINE WITHOUT CONTRAST TECHNIQUE: Multidetector CT imaging of the thoracic and lumbar spine was performed without contrast. Multiplanar CT image reconstructions were also generated. RADIATION DOSE REDUCTION: This exam was performed according to the departmental dose-optimization program which includes automated exposure control, adjustment of the mA and/or kV according to patient size and/or use of iterative reconstruction technique. COMPARISON:  CTA chest 01/12/2021 CT abdomen pelvis 08/17/2022 FINDINGS: CT THORACIC SPINE FINDINGS Alignment: Normal. Vertebrae: Height loss at T1, T3 and T8 is unchanged. No acute fracture. Paraspinal and other soft tissues: Calcific aortic atherosclerosis. Disc levels: No spinal canal stenosis. CT LUMBAR SPINE FINDINGS Segmentation: 5 lumbar type vertebrae. Alignment: Normal. Vertebrae: Unchanged vertebra plana configuration of L1 compared to 08/17/2022. Remote augmentation of L3 with cement extending into the L2-3 disc space. Height loss at the other lumbar levels is also unchanged. Paraspinal and other soft tissues: Calcific aortic atherosclerosis. Infrarenal aortic graft. Disc levels: Severe spinal canal stenosis at L3-4 and  L4-5. IMPRESSION: 1. No acute fracture or static subluxation of the thoracic or lumbar spine. 2. Unchanged vertebra plana configuration of L1 compared to 08/17/2022. 3. Remote augmentation of L3 with cement extending into the L2-3 disc space. 4. 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 5. Chronic compression deformities of T1, T3 and T8 are unchanged since 01/12/2021. Aortic Atherosclerosis (ICD10-I70.0). Electronically Signed   By: Ulyses Jarred M.D.   On: 08/25/2022 20:16   CT Thoracic Spine Wo Contrast  Result Date: 08/25/2022 CLINICAL DATA:  Fall EXAM: CT THORACIC AND LUMBAR SPINE WITHOUT CONTRAST  TECHNIQUE: Multidetector CT imaging of the thoracic and lumbar spine was performed without contrast. Multiplanar CT image reconstructions were also generated. RADIATION DOSE REDUCTION: This exam was performed according to the departmental dose-optimization program which includes automated exposure control, adjustment of the mA and/or kV according to patient size and/or use of iterative reconstruction technique. COMPARISON:  CTA chest 01/12/2021 CT abdomen pelvis 08/17/2022 FINDINGS: CT THORACIC SPINE FINDINGS Alignment: Normal. Vertebrae: Height loss at T1, T3 and T8 is unchanged. No acute fracture. Paraspinal and other soft tissues: Calcific aortic atherosclerosis. Disc levels: No spinal canal stenosis. CT LUMBAR SPINE FINDINGS Segmentation: 5 lumbar type vertebrae. Alignment: Normal. Vertebrae: Unchanged vertebra plana configuration of L1 compared to 08/17/2022. Remote augmentation of L3 with cement extending into the L2-3 disc space. Height loss at the other lumbar levels is also unchanged. Paraspinal and other soft tissues: Calcific aortic atherosclerosis. Infrarenal aortic graft. 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 IMPRESSION: 1. No acute fracture or static subluxation of the thoracic or lumbar spine. 2. Unchanged vertebra plana configuration of L1 compared to 08/17/2022. 3. Remote augmentation of L3 with cement extending into the L2-3 disc space. 4. 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 5. Chronic compression deformities of T1, T3 and T8 are unchanged since 01/12/2021. Aortic Atherosclerosis (ICD10-I70.0). Electronically Signed   By: Ulyses Jarred M.D.   On: 08/25/2022 20:16   DG Hip Unilat W or Wo Pelvis 2-3 Views Left  Result Date: 08/25/2022 CLINICAL DATA:  Left hip pain. EXAM: DG HIP (WITH OR WITHOUT PELVIS) 3V LEFT COMPARISON:  Left hip radiographs 11/03/2021. FINDINGS: A minimally displaced intratrochanteric fracture is present in the proximal left femur. The hip  joint is located. Extensive vascular calcifications are present. Right hip ORIF noted. Bowel gas pattern is normal. IMPRESSION: 1. Minimally displaced intratrochanteric fracture of the proximal left femur. 2. Extensive vascular calcifications. Electronically Signed   By: San Morelle M.D.   On: 08/25/2022 19:34        Scheduled Meds:  heparin  5,000 Units Subcutaneous Q8H   lacosamide  100 mg Oral BID   levothyroxine  50 mcg Oral Daily   [START ON 08/27/2022] midodrine  10 mg Oral Q M,W,F   pantoprazole  40 mg Oral Daily   QUEtiapine  25 mg Oral QHS   rosuvastatin  10 mg Oral Daily   senna-docusate  1 tablet Oral QHS   sodium zirconium cyclosilicate  10 g Oral STAT   Continuous Infusions:   LOS: 1 day        Phillips Climes, MD [facility]   To contact the attending provider between 7A-7P or the covering provider during after hours 7P-7A, please log into the web site [email or web address] and access using universal [place] password for that web site. If you do not have the password, please call the hospital operator.  08/26/2022, 8:50 AM

## 2022-08-26 NOTE — Anesthesia Postprocedure Evaluation (Signed)
Anesthesia Post Note  Patient: JAQUAVIUS HUDLER  Procedure(s) Performed: INTRAMEDULLARY (IM) NAIL INTERTROCHANTERIC (Left)     Patient location during evaluation: PACU Anesthesia Type: General Level of consciousness: awake and alert Pain management: pain level controlled Vital Signs Assessment: post-procedure vital signs reviewed and stable Respiratory status: spontaneous breathing, nonlabored ventilation and respiratory function stable Cardiovascular status: blood pressure returned to baseline and stable Postop Assessment: no apparent nausea or vomiting Anesthetic complications: no  No notable events documented.  Last Vitals:  Vitals:   08/26/22 1245 08/26/22 1300  BP: (!) 144/77 135/67  Pulse:  86  Resp: (!) 26 18  Temp: 36.5 C   SpO2: 93% 96%    Last Pain:  Vitals:   08/26/22 1245  TempSrc:   PainSc: Asleep                 Denis Carreon,W. EDMOND

## 2022-08-26 NOTE — Anesthesia Procedure Notes (Addendum)
Procedure Name: Intubation Date/Time: 08/26/2022 10:54 AM  Performed by: Janene Harvey, CRNAPre-anesthesia Checklist: Patient identified, Emergency Drugs available, Suction available and Patient being monitored Patient Re-evaluated:Patient Re-evaluated prior to induction Oxygen Delivery Method: Circle system utilized Preoxygenation: Pre-oxygenation with 100% oxygen Induction Type: IV induction Ventilation: Mask ventilation without difficulty Laryngoscope Size: Mac and 4 Grade View: Grade I Tube type: Oral Tube size: 7.5 mm Number of attempts: 1 Airway Equipment and Method: Stylet and Oral airway Placement Confirmation: ETT inserted through vocal cords under direct vision, positive ETCO2 and breath sounds checked- equal and bilateral Secured at: 22 cm Tube secured with: Tape Dental Injury: Teeth and Oropharynx as per pre-operative assessment  Comments: Upper dentures in place during induction. Removed easily/dentures and plate intact post removal. Dentures given to circulator RN

## 2022-08-26 NOTE — Consult Note (Signed)
Renal Service Consult Note [facility]  LIEUTENANT ABARCA 08/26/2022 Sol Blazing, MD Requesting Physician: Dr. Waldron Labs  Reason for Consult: ESRD pt w/ hip fracture HPI: The patient is a 81 y.o. year-old /w PMH as below who fell yesterday at home while walking to the bathroom and fell onto his left side. Brought to ED where VSS, BP 160/90, HR 80, 97% on RA. Xray revealed a L hip fracture. Orthopedics saw the pt and planned surgery for today. Pt was admitted. Today pt had ORIF of L hip fracture. We are asked to see for dialysis.   Pt seen in room. Pt groggy and confused, hands in mittens. No hx obtained.   ROS - n/a   Past Medical History  Past Medical History:  Diagnosis Date   Allergy    Alzheimer's disease ([place]) 01/19/2021   Anemia    Arthritis    Cataract    bil cateracts removed   Coronary artery disease    Diabetes mellitus    Type 2   Diverticulitis    ED (erectile dysfunction)    Elevated homocysteine    ESRD (end stage renal disease) on dialysis ([place]) 03/2015   M-W-F dialysis   GERD (gastroesophageal reflux disease)    pepto    Gout    Hiatal hernia    Hyperlipidemia    Hypertension    Hypothyroidism    PVD (peripheral vascular disease) ([place])    has plastic aorta   Renal insufficiency    Sleep apnea    does not wear c-pap   Past Surgical History  Past Surgical History:  Procedure Laterality Date   ANGIOPLASTY Right 01/26/2021   Procedure: ANGIOPLASTY RIGHT SUBCLAVIAN VEIN;  Surgeon: Serafina Mitchell, MD;  Location: [place];  Service: Vascular;  Laterality: Right;   aortobifemoral bypass     AV FISTULA PLACEMENT Left 12/01/2013   Procedure: ARTERIOVENOUS (AV) FISTULA CREATION- LEFT BRACHIOCEPHALIC;  Surgeon: Angelia Mould, MD;  Location: [place];  Service: Vascular;  Laterality: Left;   BASCILIC VEIN TRANSPOSITION Right 07/27/2014   Procedure: BASCILIC VEIN TRANSPOSITION;  Surgeon: Angelia Mould, MD;  Location: [place];  Service:  Vascular;  Laterality: Right;   BRAIN SURGERY  07/22/2018   BREAST SURGERY     left - granulomatous mastitis   BURR HOLE Bilateral 07/22/2018   Procedure: BILATERAL BURR HOLES;  Surgeon: Kristeen Miss, MD;  Location: [place];  Service: Neurosurgery;  Laterality: Bilateral;   COLONOSCOPY     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 ([place] HX)  1992   ENTEROSCOPY N/A 06/03/2018   Procedure: ENTEROSCOPY;  Surgeon: Lavena Bullion, DO;  Location: [facility];  Service: Gastroenterology;  Laterality: N/A;   ENTEROSCOPY N/A 11/20/2018   Procedure: ENTEROSCOPY;  Surgeon: Rush Landmark Telford Nab., MD;  Location: [place];  Service: Gastroenterology;  Laterality: N/A;   ENTEROSCOPY N/A 12/03/2019   Procedure: ENTEROSCOPY;  Surgeon: Lavena Bullion, DO;  Location: [facility] ENDOSCOPY;  Service: Gastroenterology;  Laterality: N/A;  push enteroscopy   ENTEROSCOPY N/A 07/04/2021   Procedure: ENTEROSCOPY;  Surgeon: Sharyn Creamer, MD;  Location: [facility] ENDOSCOPY;  Service: Gastroenterology;  Laterality: N/A;   ENTEROSCOPY N/A 11/05/2021   Procedure: ENTEROSCOPY;  Surgeon: Daryel November, MD;  Location: [facility] ENDOSCOPY;  Service: Gastroenterology;  Laterality: N/A;   EYE SURGERY Bilateral    cataracts   FISTULOGRAM Right 01/26/2021   Procedure: FISTULOGRAM RIGHT;  Surgeon: Serafina Mitchell, MD;  Location: [place];  Service: Vascular;  Laterality: Right;   HEMODIALYSIS INPATIENT  01/17/2018       HOT HEMOSTASIS N/A 06/03/2018   Procedure: HOT HEMOSTASIS (ARGON PLASMA COAGULATION/BICAP);  Surgeon: Lavena Bullion, DO;  Location: [facility] ENDOSCOPY;  Service: Gastroenterology;  Laterality: N/A;   HOT HEMOSTASIS N/A 11/20/2018   Procedure: HOT HEMOSTASIS (ARGON PLASMA COAGULATION/BICAP);  Surgeon: Irving Copas., MD;  Location: [place];  Service: Gastroenterology;  Laterality: N/A;   HOT HEMOSTASIS N/A 12/03/2019   Procedure: HOT HEMOSTASIS (ARGON PLASMA COAGULATION/BICAP);  Surgeon: Lavena Bullion, DO;   Location: [facility] ENDOS[facility];  Service: Gastroenterology;  Laterality: N/A;   INTRAMEDULLARY (IM) NAIL INTERTROCHANTERIC Right 11/08/2020   Procedure: INTRAMEDULLARY (IM) NAIL INTERTROCHANTRIC;  Surgeon: Erle Crocker, MD;  Location: [place];  Service: Orthopedics;  Laterality: Right;   REVISION OF ARTERIOVENOUS GORETEX GRAFT Right 09/01/2019   Procedure: REVISION OF ARTERIOVENOUS FISTULA RIGHT ARM;  Surgeon: Serafina Mitchell, MD;  Location: [place];  Service: Vascular;  Laterality: Right;   REVISON OF ARTERIOVENOUS FISTULA Left 02/09/2014   Procedure: REVISON OF LEFT ARTERIOVENOUS FISTULA - RESECTION OF RENDUNDANT VEIN;  Surgeon: Angelia Mould, MD;  Location: [place];  Service: Vascular;  Laterality: Left;   REVISON OF ARTERIOVENOUS FISTULA Right 01/26/2021   Procedure: REVISON OF ARTERIOVENOUS FISTULA RIGHT;  Surgeon: Serafina Mitchell, MD;  Location: [place];  Service: Vascular;  Laterality: Right;   SBO with lysis adhesions     SHUNTOGRAM Left 04/19/2014   Procedure: FISTULOGRAM;  Surgeon: Angelia Mould, MD;  Location: [facility] CATH LAB;  Service: Cardiovascular;  Laterality: Left;   UNILATERAL UPPER EXTREMEITY ANGIOGRAM N/A 07/12/2014   Procedure: UNILATERAL UPPER Anselmo Rod;  Surgeon: Angelia Mould, MD;  Location: [facility] CATH LAB;  Service: Cardiovascular;  Laterality: N/A;   Family History  Family History  Problem Relation Age of Onset   Aneurysm Mother        brain   Heart disease Father    Stroke Father    Hypertension Father    Diabetes Father    Dementia Neg Hx    Colon cancer Neg Hx    Esophageal cancer Neg Hx    Pancreatic cancer Neg Hx    Prostate cancer Neg Hx    Rectal cancer Neg Hx    Stomach cancer Neg Hx    Social History  reports that he quit smoking about 27 years ago. His smoking use included cigarettes. He has never been exposed to tobacco smoke. He has never used smokeless tobacco. He reports that he does not drink alcohol and does not use  drugs. Allergies  Allergies  Allergen Reactions   Ambien [Zolpidem Tartrate] Other (See Comments)    Hallucinations and "felt crazy"    Penicillins Hives and Rash    Has patient had a PCN reaction causing immediate rash, facial/tongue/throat swelling, SOB or lightheadedness with hypotension: Yes Has patient had a PCN reaction causing severe rash involving mucus membranes or skin necrosis: Yes Has patient had a PCN reaction that required hospitalization: No Has patient had a PCN reaction occurring within the last 10 years: No If all of the above answers are "NO", then may proceed with Cephalosporin use.    Home medications Prior to Admission medications   Medication Sig Start Date End Date Taking? Authorizing Provider  allopurinol (ZYLOPRIM) 100 MG tablet Take 100 mg by mouth daily.   Yes [provider]  butalbital-acetaminophen-caffeine (FIORICET) 50-325-40 MG tablet Take 1 tablet by mouth every 6 (six) hours as needed  for headache.   Yes [provider]  camphor-menthol Timoteo Ace) lotion Apply 1 application topically 4 (four) times daily as needed for itching.    Yes [provider]  Carboxymethylcellulose Sodium (THERATEARS) 0.25 % SOLN Place 1 drop into both eyes 3 (three) times daily as needed (for dryness).   Yes [provider]  cyanocobalamin (,VITAMIN B-12,) 1000 MCG/ML injection Inject 1 mL (1,000 mcg total) into the muscle every 30 (thirty) days. 04/13/22  Yes Baxley, Cresenciano Lick, MD  fluticasone (FLONASE) 50 MCG/ACT nasal spray Place 1 spray into both nostrils daily. Patient taking differently: Place 2 sprays into both nostrils daily as needed for allergies. 10/08/14  Yes Mikhail, Velta Addison, DO  hydrocortisone 2.5 % cream Apply 1 application topically 3 (three) times daily as needed (skin irritation (legs & arms)).  01/13/19  Yes [provider]  lacosamide 100 MG TABS Take 1 tablet (100 mg total) by mouth 2 (two) times daily. 08/25/22  Yes Thurnell Lose, MD  levothyroxine (SYNTHROID, LEVOTHROID) 50 MCG tablet Take 1 tablet (50 mcg total) by mouth daily. Patient taking differently: Take 50 mcg by mouth daily before breakfast. 08/06/14  Yes Baxley, Cresenciano Lick, MD  memantine (NAMENDA) 10 MG tablet Take 1 tablet (10 mg total) by mouth 2 (two) times daily. 09/26/21  Yes Melvenia Beam, MD  midodrine (PROAMATINE) 10 MG tablet Take one pill prior to hemodialysis on MWF Patient taking differently: Take 10 mg by mouth See admin instructions. Take 10 mg by mouth prior to hemodialysis on Mon/Wed/Fri 08/13/18  Yes Love, Ivan Anchors, PA-C  Nutritional Supplements (FEEDING SUPPLEMENT, NEPRO CARB STEADY,) LIQD Take 237 mLs by mouth in the morning.   Yes [provider]  omeprazole (PRILOSEC) 40 MG capsule Take 1 capsule (40 mg total) by mouth daily. Patient taking differently: Take 40 mg by mouth daily before breakfast. 03/13/18  Yes Esterwood, Amy S, PA-C  QUEtiapine (SEROQUEL) 25 MG tablet Take 1 tablet (25 mg total) by mouth at bedtime. 08/06/22  Yes Baxley, Cresenciano Lick, MD  repaglinide (PRANDIN) 0.5 MG tablet TAKE 1 TABLET(0.5 MG) BY MOUTH TWICE DAILY BEFORE A MEAL Patient taking differently: Take 0.5 mg by mouth See admin instructions. Take 0.5 mg by mouth at 5 pm (AFTER dialysis) only on Mon/Wed/Fri 05/29/22  Yes Elayne Snare, MD  rosuvastatin (CRESTOR) 10 MG tablet Take 1 tablet (10 mg total) by mouth daily. 06/11/22  Yes Eugenie Filler, MD  sevelamer carbonate (RENVELA) 800 MG tablet Take 1,600-2,400 mg by mouth See admin instructions. Take 2,400 mg by mouth three times a day with meals and 1,600 mg with snacks   Yes [provider]  TRADJENTA 5 MG TABS tablet Take 1 tablet (5 mg total) by mouth daily. 11/16/21  Yes Elayne Snare, MD  acetaminophen (TYLENOL) 325 MG tablet Take 1-2 tablets (325-650 mg total) by mouth every 4 (four) hours as needed for mild pain. Patient taking differently: Take 650 mg by mouth in the morning and at bedtime.  08/01/18   Love, Ivan Anchors, PA-C  albuterol (VENTOLIN HFA) 108 (90 Base) MCG/ACT inhaler Inhale 1-2 puffs into the lungs daily as needed for wheezing or shortness of breath.    [provider]  Blood Glucose Monitoring Suppl (FREESTYLE FREEDOM LITE) w/Device KIT Use to check blood sugar 2 times per day dx code E11.65 10/21/18   Elayne Snare, MD  cetirizine (ZYRTEC) 10 MG tablet Take 10 mg by mouth daily as needed for allergies.  [provider]  donepezil (ARICEPT) 10 MG tablet Take 1 tablet (10 mg total) by mouth at bedtime. 09/26/21   Melvenia Beam, MD  doxercalciferol (HECTOROL) 4 MCG/2ML injection Inject 2.5 mLs (5 mcg total) into the vein every Monday, Wednesday, and Friday with hemodialysis. 06/18/22   Allie Bossier, MD  glucose blood (FREESTYLE LITE) test strip USE AS DIRECTED THREE TIMES DAILY 02/16/22   Elayne Snare, MD  lacosamide (VIMPAT) 50 MG TABS tablet Take 1 tablet (50 mg total) by mouth 2 (two) times daily for 5 days. 08/19/22 08/24/22  Thurnell Lose, MD  Lancets (FREESTYLE) lancets Use as instructed to check blood sugar 2 times per day dx code E11.65 02/22/16   Elayne Snare, MD  oxyCODONE-acetaminophen (PERCOCET/ROXICET) 5-325 MG tablet Take 1-2 tablets by mouth every 6 (six) hours as needed for moderate pain or severe pain (POSTOPERATIVE PAIN). 08/26/22   Georgeanna Harrison, MD  Syringe/Needle, Disp, (SYRINGE 3CC/25GX1") 25G X 1" 3 ML MISC 1 each by Does not apply route every 30 (thirty) days. 04/13/22   Elby Showers, MD     Vitals:   08/26/22 1330 08/26/22 1354 08/26/22 1929 08/26/22 2009  BP: 109/77 (!) 143/63  134/75  Pulse: 77 76    Resp: _0 Temp: 97.9 F (36.6 C) 97.8 F (36.6 C)  98.6 F (37 C)  TempSrc:  Oral  Oral  SpO2: 99%   91%  Weight:   70.4 kg   Height:   _1  (1.727 m)    Exam Gen lethargic, elderly WM, no distress, confused Hands in mittens No rash, cyanosis or gangrene No jvd or bruits Chest clear bilat to bases RRR no  MRG Abd soft ntnd no mass or ascites, large midline wound healed over GU normal male MS no joint effusions or deformity Ext no LE or UE edema, no wounds or ulcers Neuro is as above, lethargic, not responding to commands    RUA AVF+bruit    Home meds include - allopurinol, lacosamide, synthroid, midodrine 55m mwf, prilosec, seroquel 25 hs, crestor, aricept, prns/ vits/ supps   OP HD: [place] MWF  3h 435m  400/ 1.5  66kg  2/2 bath  Hep none  RUA AVF - last HD 12/1, post wt 66.7kg - doxercalciferol 8 ug IV mwf - no esa, last Hb 11.7   Assessment/ Plan: L hip fracture - sp fall. Orthopedic surg took to OR today for ORIF. Pt is confused post op (but baseline has some dementia).  ESRD - on HD MWF. Has not missed HD. HD tomorrow.  Hyperkalemia - mild, K+ 5.1 Dementia/ mood disorder - on medications BP/ volume - takes midodrine pre HD for low BP's. Not on any BP lowering meds. 4kg up by wts.  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  MBD ckd - CCa on this high side, will lower IV vdra by 50% to 4 ug IV mwf. Add on phos.     Rob Brentin Shin  MD 08/26/2022, 11:03 PM Recent Labs  Lab 08/25/22 1935 08/26/22 0455  HGB 11.3* 10.2*  ALBUMIN  --  2.8*  CALCIUM 10.0 9.9  PHOS  --  5.6*  CREATININE 5.54* 6.10*  K 5.3* 5.1   Inpatient medications:  acetaminophen  500 mg Oral Q6H   allopurinol  100 mg Oral Daily   docusate sodium  100 mg Oral BID   donepezil  10 mg Oral QHS   heparin  5,000 Units Subcutaneous Q8H  insulin aspart  0-6 Units Subcutaneous TID WC   lacosamide  100 mg Oral BID   levothyroxine  50 mcg Oral Daily   [START ON 08/27/2022] midodrine  10 mg Oral Q M,W,F   pantoprazole  40 mg Oral Daily   QUEtiapine  25 mg Oral QHS   rosuvastatin  10 mg Oral Daily   senna-docusate  1 tablet Oral QHS     ceFAZolin (ANCEF) IV 1 g (08/26/22 1738)   [START ON 08/27/2022] acetaminophen, acetaminophen, butalbital-acetaminophen-caffeine, camphor-menthol, HYDROcodone-acetaminophen,  HYDROcodone-acetaminophen, HYDROmorphone (DILAUDID) injection, melatonin, menthol-cetylpyridinium **OR** phenol, metoCLOPramide **OR** metoCLOPramide (REGLAN) injection, morphine injection, oxyCODONE-acetaminophen, polyethylene glycol, prochlorperazine

## 2022-08-26 NOTE — ED Notes (Signed)
Pt shouting for [name] and attempting to get out of bed, then screaming in pain. Pt informed that he has a broke leg and can't get out of bed.  Pt is confused about situation and time, redirectable to laying down

## 2022-08-26 NOTE — Progress Notes (Signed)
4 Eyes complete with Lauren RN. All skin intact except sx incision to Left hip. sacral heart to sacrum for protection, dry skin bilateral feet. Old scar from sternum to abdomen

## 2022-08-26 NOTE — Op Note (Signed)
OPERATIVE NOTE  [name] male 81 y.o. 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  ASSISTANT(S): None  ANESTHESIA: Choice  FINDINGS: Preoperative Examination: Left Lower Extremity: Inspection: Held in external rotated position Palpation: Tender to palpation at hip and at site of known fracture ROM: Hip range of motion severely limited due to pain Strength: Demonstrates intact dorsiflexion, plantarflexion, EHL Sensation: Intact light touch distally superficial peroneal, deep peroneal, tibial distributions Skin: Intact Peripheral Vascular: Normal DP pulse, warm well-perfused distally  Operative Findings: Mildly displaced left intertrochanteric proximal femur fracture redemonstrated on operative fluoroscopy.  Closed reduction obtained on Hana table with traction ligamentotaxis, confirmed fluoroscopically on AP and lateral orthogonal views.  Appropriate placement of short cephalomedullary nail stabilizing fracture including proximal lag screw and distal interlock screw.  IMPLANTS: Implant Name Type Inv. Item Serial No. Manufacturer Lot No. LRB No. Used Action  NAIL CANN FEM RT [id number] [id number] - [id number] Nail NAIL CANN FEM RT [id number] [id number]  [employer] [id number] Left 1 Implanted  SCREW FENES TFNA 110 - [id number] Screw SCREW FENES TFNA 110  [employer]  Left 1 Implanted  SCREW LOCK IM TI 5X40 - [id number] Screw SCREW LOCK IM TI 5X40  [employer]  Left 1 Implanted     INDICATIONS:  The patient is a 81 y.o. male who sustained a mechanical fall and came to the emergency room where he was found to have sustained a left intertrochanteric fracture.  He was previously ambulatory with a walker before the injury and was not able to ambulate after the injury.  Due to the morbidity associated with nonoperative treatment, he did wish to proceed with operative treatment the form of a cephalomedullary nail.  He understood the risks, benefits and alternatives to surgery which include but are not limited to bleeding, wound healing complications, infection, damage to surrounding structures, persistent pain, stiffness, lack of improvement, potential for subsequent arthritis or worsening of pre-existing arthritis, nonunion, malunion, and need for further surgery, as well as complications related to anesthesia, cardiovascular complications, and death.  He also understood the potential for continued pain, and that there were no guarantees of acceptable outcome.  After weighing these risks the patient opted to proceed with surgery.  TECHNIQUE: Patient was identified in the preoperative holding area.  The left hip was confirmed as the appropriate operative site and marked by me.  Consent was signed by myself and the patient and witnessed by the preoperative nurse.  No block was performed by anesthesia in the preoperative holding area.  Patient was taken to the operative suite and placed supine on the operative table.  Anesthesia was induced by the anesthesia team.  The patient was positioned appropriately for the procedure and all bony prominences were well padded.  A tourniquet was not used.  Preoperative antibiotics were given. The extremity was prepped and draped in the usual sterile fashion and surgical timeout was performed.  Surgery was performed on Hana table.  Close reduction was performed on Hana table with traction and ligamentotaxis, confirmed on orthogonal AP and lateral  fluoroscopic views.  Surface anatomy was marked out laterally.  The location of the incision was identified fluoroscopically.  A short  curvilinear incision proximal to the greater trochanter was marked out on skin.  Skin was incised sharply.  Underlying subcutaneous tissue was dissected with Bovie electrocautery down to fascia layer.  Fascia was split sharply in line fibers.  Underlying muscle split bluntly in line with fibers, giving access to the tip the greater trochanter and the starting point.  Starting wire was positioned to the trochanteric entry point under fluoroscopic guidance and advanced in center center position into the proximal intramedullary canal of the femur.  Proximal femur was cannulated with entry reamer.  Starting wire entry reamer were withdrawn.  A 12 mm short length cephalomedullary nail was mounted on the jig impacted into position under fluoroscopic guidance, confirming appropriate placement orthogonal AP and lateral fluoroscopic views.  Location for the distal interlocking lag screw were noted on the skin.  Skin was incised sharply.  Underlying subcutaneous tissue and fascia was divided sharply exposing lateral femoral cortex.  Triple sleeve trocar was advanced in position on the lateral femoral cortex.  Guidewire for the screw was advanced proximally through the femoral neck to the femoral head under fluoroscopic guidance, taking care to avoid subchondral penetration.  Measurement for the screw was obtained over the wire.  Path for the lag screw was drilled, and lag screw was advanced into position under fluoroscopic guidance.  The fracture was compressed and the construct was statically locked.  A distal interlock screw was placed through the jig.  Final x-rays were obtained demonstrating reduction of the fracture with appropriate placement of cephalomedullary nail including proximal lag screw and distal interlock screw.  Wounds were copiously irrigated and hemostasis was obtained.  1 g  vancomycin powder was placed deep in the wounds.  Fascia was closed with interrupted figure-of-eight #1 PDS, followed by simple inverted interrupted #0 Vicryl in the deeper fat layers, followed by simple inverted erupted #2 Monocryl deep dermal, followed by running #3 Monocryl subcuticular.  Skin was sealed with Dermabond.  Suture tails secured with Steri-Strips.  Aquacel dressings were placed over the wounds.  Patient was awakened from anesthesia and transferred to PACU in stable condition.  He tolerated procedure well.  No complications were noted intraoperatively.  POSTOPERATIVE INSTRUCTIONS: Mobility: Out of bed with PT/OT Pain control: Continue to wean/titrate to appropriate oral regimen DVT Prophylaxis: Defer to primary medical team for DVT prophylaxis given CKD. Recommend continuing pharmacologic prophylaxis for 6 weeks postoperatively.  Further surgical plans: None LLE: Weightbearing as tolerated, no restrictions Dressing care: Keep AQUACEL on and dry for up to 14 days.  Do not allow surgical area to get wet before that.  Remove AQUACEL dressing after 14 days and allow area to get wet in shower but DO NOT SUBMERGE until wound is evaluated in clinic.  In most cases skin glue is used and no additional dressing is necessary.  Disposition: Per primary team as medically appropriate Follow-up: Please call [facility] [phone]) to schedule follow-op appointment for 2 weeks after surgery.  TOURNIQUET TIME: * No tourniquets in log *  BLOOD LOSS: 150 mL         DRAINS: None         SPECIMEN: None       COMPLICATIONS:  * No complications entered in OR log *         DISPOSITION: PACU - hemodynamically stable.         CONDITION: stable   Georgeanna Harrison M.D. Orthopaedic Surgery [facility]   Portions of the record  have been created with voice recognition software.  Grammatical and punctuation errors, random word insertions,  wrong-word or "sound-a-like" substitutions, pronoun errors (inaccuracies and/or substitutions), and/or incomplete sentences may have occurred due to the inherent limitations of voice recognition software.  Not all errors are caught or corrected.  Although every attempt is made to root out erroneous and incomplete transcription, the note may still not fully represent the intent or opinion of the author.  Read the chart carefully and recognize, using context, where errors/substitutions have occurred.  Any questions or concerns about the content of this note or information contained within the body of this dictation should be addressed directly with the author for clarification.

## 2022-08-26 NOTE — Transfer of Care (Signed)
Immediate Anesthesia Transfer of Care Note  Patient: NILAN IDDINGS  Procedure(s) Performed: INTRAMEDULLARY (IM) NAIL INTERTROCHANTERIC (Left)  Patient Location: PACU  Anesthesia Type:General  Level of Consciousness: drowsy  Airway & Oxygen Therapy: Patient Spontanous Breathing and Patient connected to face mask oxygen  Post-op Assessment: Report given to RN and Post -op Vital signs reviewed and stable  Post vital signs: Reviewed and stable  Last Vitals:  Vitals Value Taken Time  BP 144/77 08/26/22 1245  Temp    Pulse 78 08/26/22 1251  Resp 13 08/26/22 1251  SpO2 100 % 08/26/22 1251  Vitals shown include unvalidated device data.  Last Pain:  Vitals:   08/26/22 0948  TempSrc: Oral  PainSc:          Complications: No notable events documented.

## 2022-08-26 NOTE — Discharge Instructions (Signed)
Discharge instructions for Dr. Georgeanna Harrison, M.D., Orthopaedic Surgeon, [place]:  Diet: As you were doing prior to hospitalization, unless instructed otherwise by medical team, dietary/nutrition team, etc. Dressing:  Keep AQUACEL on and dry for up to 14 days.  Do not allow surgical area to get wet before that.  Remove AQUACEL dressing after 14 days and allow area to get wet in shower but DO NOT SUBMERGE until wound is evaluated in clinic.  In most cases skin glue is used and no additional dressing is necessary.  Shower:  Unless otherwise specified, may shower but keep the wounds dry, use an occlusive plastic wrap, NO SOAKING IN TUB.  If the bandage gets wet, change with a clean dry gauze.  Unless otherwise specified, after 5 days dressing(s) should be removed (7 days for PREVENA dressings) and wound(s) may get wet in the shower by allowing water to gently run over.  Again, no soaking in tub, and do NOT submerge for at least 2 weeks!!! Activity:  Increase activity slowly as tolerated.  If you right leg is injured or immobilized, no driving for 6 weeks or until discussed with your surgeon.  If you have an injury or immobilization of the left lower extremity you may not operate a clutch. Please note that driving with any kind of immobilization for the upper extremity (sling, shoulder brace, splint, cast, etc.) may also be considered impaired driving and should not be attempted. Weight Bearing: WEIGHTBEARING AS TOLERATED (WBAT) on the LEFT LOWER EXTREMITY. To prevent constipation: You may use over-the-counter stool softener(s) such as Colace (over the counter) 100 mg by mouth twice a day and/or Miralax (over the counter) for constipation as needed.  Drink plenty of fluids (prune juice may be helpful) and high fiber foods.  Itching:  If you experience itching with your medications, try taking only a single pain pill, or even half a pain pill at a time.  You can also use  benadryl over the counter for itching or also to help with sleep.  Precautions:  If you experience chest pain or shortness of breath - call 911 immediately for transfer to the hospital emergency department!! Medications: Please contact the clinic during office hours (Monday through Friday, 0800 to 1600) if you need a refill on any medications.  Please monitor medications and allow 24 to 48 hours to process refill request!!!!  Please note that only medications directly related to the surgery can be prescribed.  For other medications (e.g. blood pressure medicines, sleeping medicines, etc.), please contact the prescribing physician or your primary care provider. DVT Prophylaxis: Defer to primary medical team for DVT prophylaxis given CKD.  Recommend continuing pharmacologic prophylaxis for 6 weeks postoperatively.  If you develop a fever greater that 101.1 deg F, purulent drainage from wound, increased redness or drainage from wound, or calf pain -- Call the office at [phone].

## 2022-08-27 ENCOUNTER — Encounter (HOSPITAL_COMMUNITY): Payer: Self-pay | Admitting: Orthopedic Surgery

## 2022-08-27 DIAGNOSIS — Z8781 Personal history of (healed) traumatic fracture: Secondary | ICD-10-CM | POA: Diagnosis not present

## 2022-08-27 DIAGNOSIS — E875 Hyperkalemia: Secondary | ICD-10-CM

## 2022-08-27 DIAGNOSIS — N186 End stage renal disease: Secondary | ICD-10-CM

## 2022-08-27 LAB — GLUCOSE, CAPILLARY
Glucose-Capillary: 102 mg/dL — ABNORMAL HIGH (ref 70–99)
Glucose-Capillary: 106 mg/dL — ABNORMAL HIGH (ref 70–99)

## 2022-08-27 LAB — RENAL FUNCTION PANEL
Albumin: 2.7 g/dL — ABNORMAL LOW (ref 3.5–5.0)
Anion gap: 14 (ref 5–15)
BUN: 51 mg/dL — ABNORMAL HIGH (ref 8–23)
CO2: 26 mmol/L (ref 22–32)
Calcium: 9.3 mg/dL (ref 8.9–10.3)
Chloride: 94 mmol/L — ABNORMAL LOW (ref 98–111)
Creatinine, Ser: 8.4 mg/dL — ABNORMAL HIGH (ref 0.61–1.24)
GFR, Estimated: 6 mL/min — ABNORMAL LOW (ref >60)
Glucose, Bld: 116 mg/dL — ABNORMAL HIGH (ref 70–99)
Phosphorus: 7.9 mg/dL — ABNORMAL HIGH (ref 2.5–4.6)
Potassium: 6.1 mmol/L — ABNORMAL HIGH (ref 3.5–5.1)
Sodium: 134 mmol/L — ABNORMAL LOW (ref 135–145)

## 2022-08-27 LAB — CBC
HCT: 25 % — ABNORMAL LOW (ref 39.0–52.0)
Hemoglobin: 8.4 g/dL — ABNORMAL LOW (ref 13.0–17.0)
MCH: 34.4 pg — ABNORMAL HIGH (ref 26.0–34.0)
MCHC: 33.6 g/dL (ref 30.0–36.0)
MCV: 102.5 fL — ABNORMAL HIGH (ref 80.0–100.0)
Platelets: 147 10*3/uL — ABNORMAL LOW (ref 150–400)
RBC: 2.44 MIL/uL — ABNORMAL LOW (ref 4.22–5.81)
RDW: 15.6 % — ABNORMAL HIGH (ref 11.5–15.5)
WBC: 10.8 10*3/uL — ABNORMAL HIGH (ref 4.0–10.5)
nRBC: 0 % (ref 0.0–0.2)

## 2022-08-27 LAB — HEPATITIS B SURFACE ANTIGEN: Hepatitis B Surface Ag: NONREACTIVE

## 2022-08-27 LAB — PHOSPHORUS: Phosphorus: 7.8 mg/dL — ABNORMAL HIGH (ref 2.5–4.6)

## 2022-08-27 MED ORDER — POLYETHYLENE GLYCOL 3350 17 G PO PACK
17.0000 g | PACK | Freq: Two times a day (BID) | ORAL | Status: AC
  Administered 2022-08-27 – 2022-08-28 (×2): 17 g via ORAL
  Filled 2022-08-27 (×2): qty 1

## 2022-08-27 NOTE — Progress Notes (Signed)
Pt receives out-pt HD at [facility] on MWF. Pt arrives at 11:25 for 11:45 chair time. This info was provided to CSW for snf placement. Will assist as needed.   Melven Sartorius Renal Navigator [phone]

## 2022-08-27 NOTE — TOC Initial Note (Signed)
Transition of Care [facility]) - Initial/Assessment Note    Patient Details  Name: [name] MRN: [id number] Date of Birth: 01-Oct-1940  [facility] of [facility]) CM/SW Contact:    Joanne Chars, LCSW Phone Number: 08/27/2022, 2:16 PM  Clinical Narrative:     Pt oriented x1, CSW spoke with wife [name] regarding DC recommendation for SNF.  [name] asking that pt be screened for CIR.  If not CIR, she is agreeable to SNF.  Pt lives at home with wife, does have [place] aide through [employer] in place through the [place].  Pt is HD pt.  Pt is vaccinated for covid with one booster.    CSW spoke to [place] who reviewed chart and does not believe pt is candidate for CIR.  Sent out for SNF.             Expected Discharge Plan: [place] Barriers to Discharge: Continued Medical Work up, SNF Pending bed offer   Patient Goals and CMS Choice     Choice offered to / list presented to : Spouse (wife [name])  Expected Discharge Plan and Services Expected Discharge Plan: [place] In-house Referral: Clinical Social Work   Post Acute Care Choice: [place] Living arrangements for the past 2 months: [place]                                      Prior Living Arrangements/Services Living arrangements for the past 2 months: Single Family Home Lives with:: Spouse Patient language and need for interpreter reviewed:: Yes        Need for Family Participation in Patient Care: Yes (Comment) Care giver support system in place?: Yes (comment) Current home services: Homehealth aide (daily [place] aide through the [place].  [facility]) Criminal Activity/Legal Involvement Pertinent to Current Situation/Hospitalization: No - Comment as needed  Activities of Daily Living Home Assistive Devices/Equipment: Wheelchair, Environmental consultant (specify type) ADL Screening (condition at time of admission) Patient's cognitive ability adequate to safely complete daily  activities?: No Is the patient deaf or have difficulty hearing?: No Does the patient have difficulty seeing, even when wearing glasses/contacts?: No Does the patient have difficulty concentrating, remembering, or making decisions?: Yes Patient able to express need for assistance with ADLs?: No Does the patient have difficulty dressing or bathing?: Yes Independently performs ADLs?: No Communication: Needs assistance Is this a change from baseline?: Pre-admission baseline Dressing (OT): Needs assistance Is this a change from baseline?: Pre-admission baseline Grooming: Needs assistance Is this a change from baseline?: Pre-admission baseline Feeding: Needs assistance Bathing: Needs assistance Is this a change from baseline?: Pre-admission baseline Toileting: Needs assistance In/Out Bed: Needs assistance Walks in Home: Needs assistance Does the patient have difficulty walking or climbing stairs?: Yes Weakness of Legs: Both Weakness of Arms/Hands: Both  Permission Sought/Granted                  Emotional Assessment Appearance:: Appears stated age Attitude/Demeanor/Rapport: Unable to Assess Affect (typically observed): Unable to Assess Orientation: : Oriented to Self      Admission diagnosis:  Hyperkalemia [E87.5] S/p left hip fracture [Z87.81] Patient Active Problem List   Diagnosis Date Noted   S/p left hip fracture 08/25/2022   Seizure-like activity ([place]) 08/18/2022   (HFpEF) heart failure with preserved ejection fraction ([place]) 08/18/2022   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   COVID-19 virus infection 06/21/2022  Protein-calorie malnutrition, moderate ([place]) 06/21/2022   DNR (do not resuscitate) 41/32/4401   Systolic and diastolic CHF, chronic ([place]) 06/16/2022   Atypical chest pain 06/16/2022   Anemia of chronic disease 06/16/2022   End-stage renal disease on hemodialysis ([place]) 06/16/2022   Abdominal pain, chronic, left lower quadrant 06/16/2022   Controlled  type 2 diabetes mellitus with hyperglycemia ([place]) 06/16/2022   Prolonged QT interval    Acute systolic heart failure ([place])    Chest pain 06/06/2022   Abdominal pain 06/06/2022   Volume overload 06/06/2022   Skin ulcer, limited to breakdown of skin ([place]) 02/72/5366   Chronic systolic congestive heart failure ([place]) 04/12/2022   Depression, major, single episode, moderate ([place]) 04/12/2022   Paroxysmal atrial fibrillation ([place]) 04/12/2022   UTI (urinary tract infection) 11/05/2021   Pressure injury of skin 11/05/2021   Acute blood loss anemia    Acute GI bleeding 11/04/2021   Pulmonary nodule 10/05/2021   Acute encephalopathy 10/02/2021   Hypernatremia 10/02/2021   Elevated troponin 07/04/2021   Mixed diabetic hyperlipidemia associated with type 2 diabetes mellitus ([place]) 07/04/2021   Alzheimer's dementia with behavioral disturbance ([place]) 01/24/2021   Senile nuclear sclerosis 11/22/2020   Other specified erythematous condition 11/22/2020   Other specified disease of sebaceous glands 11/22/2020   Osteoporosis 11/22/2020   Obesity 11/22/2020   Macrocytosis 11/22/2020   Dystrophia unguium 11/22/2020   Tinea pedis 11/22/2020   Pre-operative clearance    Closed nondisplaced intertrochanteric fracture of right femur ([place]) 11/07/2020   Hip fracture ([place]) 11/07/2020   Fall    Rectal bleeding 04/22/2020   AVM (arteriovenous malformation) of small bowel, acquired    Hiatal hernia    Schatzki's ring of distal esophagus    Other bacterial infections of unspecified site 08/31/2019   Hypocalcemia 04/06/2019   Melena 11/19/2018   Leukocytosis    Anemia    TBI (traumatic brain injury) ([place]) 07/29/2018   Diabetes mellitus type 2 in nonobese (HCC)    Loose stools    Dementia without behavioral disturbance ([place])    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[place]) 07/21/2018   Multiple fractures of ribs, left side, initial encounter for closed fracture 07/21/2018   Subdural hematoma ([place])  07/21/2018   Gastritis, unspecified, with bleeding 06/04/2018   Angiodysplasia of stomach    Occult GI bleeding    GIB (gastrointestinal bleeding) 06/01/2018   Low back pain 01/17/2018   Anorexia    Diverticulitis of intestine, part unspecified, without perforation or abscess without bleeding 08/07/2017   Dyspnea    Macrocytic anemia 06/04/2016   Thrombocytopenia ([place]) 06/04/2016   Exertional dyspnea 06/04/2016   Arm paresthesia, left 06/04/2016   Dyspnea on exertion 06/04/2016   Tenderness of right calf 06/04/2016   ESRD on dialysis ([place]) 05/17/2015   Hyperlipidemia, unspecified 02/25/2015   Pruritus, unspecified 02/25/2015   Pain, unspecified 02/25/2015   Other specified coagulation defects ([place]) 02/25/2015   Iron deficiency anemia, unspecified 02/25/2015   Headache, unspecified 02/25/2015   Gout, unspecified 02/25/2015   Fever, unspecified 02/25/2015   Diarrhea, unspecified 02/25/2015   Acute on chronic anemia 02/25/2015   Acute embolism and thrombosis of left subclavian vein ([place]) 02/25/2015   Hypoglycemia    Weakness 02/18/2015   OSA (obstructive sleep apnea) 02/02/2015   Hyperkalemia 10/05/2014   ESRD (end stage renal disease) ([place]) 11/18/2013   BPH (benign prostatic hyperplasia) 11/22/2012   Peripheral vascular disease ([place]) 12/24/2011   Hyperparathyroidism ([place]) 12/24/2011   Hypothyroidism 12/24/2011   Allergic rhinitis  12/24/2011   Erectile dysfunction 12/24/2011   Non-insulin dependent type 2 diabetes mellitus ([place]) 09/03/2008   Hyperlipidemia 09/03/2008   CAD in native artery 09/03/2008   Essential hypertension 08/30/2008   PANCREATITIS, HX OF 08/30/2008   RENAL FAILURE, ACUTE, HX OF 08/30/2008   DIVERTICULOSIS, COLON 06/08/2003   PCP:  Elby Showers, MD Pharmacy:   [facility] [place], [place] - [place] [address] Phone: [phone] Fax:  [place], [place] - [address] [place] [address] Phone: [phone] Fax: [phone]  [place], [place] [address] [place] [address] Phone: [phone] Fax: [phone]     Social Determinants of Health ([place]) Interventions    Readmission Risk Interventions    06/22/2022    1:15 PM 06/15/2022    3:42 PM 06/10/2022    2:43 PM  Readmission Risk Prevention Plan  Transportation Screening Complete Complete Complete  Medication Review (RN Care Manager) Complete Complete Complete  PCP or Specialist appointment within 3-5 days of discharge Complete Complete Complete  HRI or Home Care Consult Complete Complete Complete  SW Recovery Care/Counseling Consult Complete Complete Complete  Palliative Care Screening Not Applicable Not Applicable Not [place] Not Applicable Not Applicable Not Applicable

## 2022-08-27 NOTE — Evaluation (Signed)
Physical Therapy Evaluation Patient Details Name: [name] MRN: [id number] DOB: 1941/02/11 Today's Date: 08/27/2022  History of Present Illness  [name] is a 81 y.o. male who presented following fall to the L side while ambulating to the bathroom. Pt was found to have a fractured hip and is now post op day 1 ORIF.  PMHx of Alzheimers, HTN, hypothyroidism, OSA, ESRD on dialysis, PVD, GERD, diabetes, CAD.  Clinical Impression  Pt demonstrates significant cognitive deficits that interfered with pt ability to participate in skilled physical therapy services this session. Pt is able to be re-directed for short periods of time and due to pt prior level of functioning and current condition is expected to make progress as pain is more controlled and pt is able to mobilize further. Pt will need to continue to be assessed for appropriateness of skilled physical therapy services in order to address current deficits within pt abilities. Pt will benefit from SNF placement in order to return to PLOF due to current level of assist available at home per pt chart.        Recommendations for follow up therapy are one component of a multi-disciplinary discharge planning process, led by the attending physician.  Recommendations may be updated based on patient status, additional functional criteria and insurance authorization.  Follow Up Recommendations Skilled nursing-short term rehab (<3 hours/day) Can patient physically be transported by private vehicle: No    Assistance Recommended at Discharge Frequent or constant Supervision/Assistance  Patient can return home with the following  Two people to help with walking and/or transfers    Equipment Recommendations None recommended by PT  Recommendations for Other Services       Functional Status Assessment Patient has had a recent decline in their functional status and demonstrates the ability to make significant improvements in function in a  reasonable and predictable amount of time.     Precautions / Restrictions Precautions Precautions: Fall Restrictions Weight Bearing Restrictions: Yes LLE Weight Bearing: Weight bearing as tolerated      Mobility  Bed Mobility Overal bed mobility: Needs Assistance Bed Mobility: Supine to Sit, Sit to Supine     Supine to sit: +2 for physical assistance Sit to supine: +2 for physical assistance   General bed mobility comments: Pt demonstrates significant difficutly with sequencing activity and was unable to follow directions well today.    Transfers  General transfer comment: Pt was unable to transfer this date.      Balance Overall balance assessment: History of Falls, Needs assistance Sitting-balance support: Bilateral upper extremity supported, Feet supported Sitting balance-Leahy Scale: Fair             Pertinent Vitals/Pain Pain Assessment Pain Assessment: Faces Faces Pain Scale: Hurts whole lot Breathing: occasional labored breathing, short period of hyperventilation Negative Vocalization: repeated troubled calling out, loud moaning/groaning, crying Facial Expression: facial grimacing Body Language: tense, distressed pacing, fidgeting Consolability: distracted or reassured by voice/touch PAINAD Score: 7 Pain Intervention(s): Limited activity within patient's tolerance, Premedicated before session, Repositioned, Monitored during session    Home Living Family/patient expects to be discharged to:: Inpatient rehab Living Arrangements: Spouse/significant other Available Help at Discharge: Family;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 hours/day Type of Home: House Home Access: Stairs to enter Entrance Stairs-Rails: Right;Left;Can reach both Entrance Stairs-Number of Steps: stair lift in the garage   Home Layout: Two level;Able to live on main level with bedroom/bathroom Home Equipment: Rolling Walker (2 wheels);Hospital bed;Transport chair;Wheelchair - manual;Grab bars -  toilet Additional Comments: info gleaned  from chart    Prior Function Prior Level of Function : Patient poor historian/Family not available             Mobility Comments: Per chart pt ambulates with "rolling walker" ADLs Comments: unable to report - per chart family assists as needed     Hand Dominance        Extremity/Trunk Assessment   Upper Extremity Assessment Upper Extremity Assessment: Defer to OT evaluation    Lower Extremity Assessment Lower Extremity Assessment: Generalized weakness;LLE deficits/detail LLE Deficits / Details: WBAT Max A with LLE for bed mobility and any movement due to recent injury and surgery LLE: Unable to fully assess due to pain    Cervical / Trunk Assessment Cervical / Trunk Assessment: Kyphotic  Communication   Communication: HOH  Cognition Arousal/Alertness: Awake/alert Behavior During Therapy: Anxious Overall Cognitive Status: History of cognitive impairments - at baseline         General Comments: Dementia at baseline. [name]        General Comments General comments (skin integrity, edema, etc.): Pt was received wearing mitts and was returned to supine position with mitts in place.    Exercises     Assessment/Plan    PT Assessment Patient needs continued PT services  PT Problem List Decreased strength;Decreased cognition;Pain;Decreased mobility;Decreased activity tolerance;Decreased safety awareness       PT Treatment Interventions Functional mobility training;Patient/family education;Gait training;Therapeutic activities;Neuromuscular re-education;Therapeutic exercise;Manual techniques    PT Goals (Current goals can be found in the Care Plan section)  Acute Rehab PT Goals PT Goal Formulation: Patient unable to participate in goal setting Time For Goal Achievement: 09/10/22 Potential to Achieve Goals: Fair    Frequency Min 3X/week     Co-evaluation PT/OT/SLP  Co-Evaluation/Treatment: Yes Reason for Co-Treatment: Complexity of the patient's impairments (multi-system involvement);Necessary to address cognition/behavior during functional activity;For patient/therapist safety PT goals addressed during session: Mobility/safety with mobility         AM-PAC PT "6 Clicks" Mobility  Outcome Measure Help needed turning from your back to your side while in a flat bed without using bedrails?: Total Help needed moving from lying on your back to sitting on the side of a flat bed without using bedrails?: Total Help needed moving to and from a bed to a chair (including a wheelchair)?: Total Help needed standing up from a chair using your arms (e.g., wheelchair or bedside chair)?: Total Help needed to walk in hospital room?: Total Help needed climbing 3-5 steps with a railing? : Total 6 Click Score: 6    End of Session   Activity Tolerance: Treatment limited secondary to agitation;Patient limited by pain;Other (comment) (Pt limited due to cognition) Patient left: in bed;with call bell/phone within reach;Other (comment);with restraints reapplied (rails up and mitts in place) Nurse Communication: Other (comment) (pt had meal tray but had mitts on) PT Visit Diagnosis: Muscle weakness (generalized) (M62.81);Repeated falls (R29.6);Other abnormalities of gait and mobility (R26.89)    Time: 1050-1110 PT Time Calculation (min) (ACUTE ONLY): 20 min   Charges:   PT Evaluation $PT Eval Moderate Complexity: [place], DPT, CLT  Acute Rehabilitation Services Office: [phone] (Secure chat preferred)   Ander Purpura 08/27/2022, 11:36 AM

## 2022-08-27 NOTE — NC FL2 (Signed)
[place] LEVEL OF CARE FORM     IDENTIFICATION  Patient Name: [name] Birthdate: 03-May-1941 Sex: male Admission Date (Current Location): 08/25/2022  [facility] and [place] Number:  Herbalist and Address:  [facility] [place], [place] [address], [place], [place] [address]      Provider Number: [id number]  Attending Physician Name and Address:  Albertine Patricia, MD  Relative Name and Phone Number:  Ade, Stmarie [phone]  [phone]    Current Level of Care: Hospital Recommended Level of Care: [place] Prior Approval Number:    Date Approved/Denied:   PASRR Number: [id number]  Discharge Plan: SNF    Current Diagnoses: Patient Active Problem List   Diagnosis Date Noted   S/p left hip fracture 08/25/2022   Seizure-like activity ([place]) 08/18/2022   (HFpEF) heart failure with preserved ejection fraction ([place]) 08/18/2022   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   COVID-19 virus infection 06/21/2022   Protein-calorie malnutrition, moderate ([place]) 06/21/2022   DNR (do not resuscitate) 91/63/8466   Systolic and diastolic CHF, chronic ([place]) 06/16/2022   Atypical chest pain 06/16/2022   Anemia of chronic disease 06/16/2022   End-stage renal disease on hemodialysis ([place]) 06/16/2022   Abdominal pain, chronic, left lower quadrant 06/16/2022   Controlled type 2 diabetes mellitus with hyperglycemia ([place]) 06/16/2022   Prolonged QT interval    Acute systolic heart failure ([place])    Chest pain 06/06/2022   Abdominal pain 06/06/2022   Volume overload 06/06/2022   Skin ulcer, limited to breakdown of skin ([place]) 59/93/5701   Chronic systolic congestive heart failure ([place]) 04/12/2022   Depression, major, single episode, moderate (HCC) 04/12/2022   Paroxysmal atrial fibrillation ([place]) 04/12/2022   UTI (urinary tract infection) 11/05/2021   Pressure injury of skin 11/05/2021   Acute blood loss anemia    Acute GI  bleeding 11/04/2021   Pulmonary nodule 10/05/2021   Acute encephalopathy 10/02/2021   Hypernatremia 10/02/2021   Elevated troponin 07/04/2021   Mixed diabetic hyperlipidemia associated with type 2 diabetes mellitus ([place]) 07/04/2021   Alzheimer's dementia with behavioral disturbance ([place]) 01/24/2021   Senile nuclear sclerosis 11/22/2020   Other specified erythematous condition 11/22/2020   Other specified disease of sebaceous glands 11/22/2020   Osteoporosis 11/22/2020   Obesity 11/22/2020   Macrocytosis 11/22/2020   Dystrophia unguium 11/22/2020   Tinea pedis 11/22/2020   Pre-operative clearance    Closed nondisplaced intertrochanteric fracture of right femur ([place]) 11/07/2020   Hip fracture ([place]) 11/07/2020   Fall    Rectal bleeding 04/22/2020   AVM (arteriovenous malformation) of small bowel, acquired    Hiatal hernia    Schatzki's ring of distal esophagus    Other bacterial infections of unspecified site 08/31/2019   Hypocalcemia 04/06/2019   Melena 11/19/2018   Leukocytosis    Anemia    TBI (traumatic brain injury) ([employer]) 07/29/2018   Diabetes mellitus type 2 in nonobese (HCC)    Loose stools    Dementia without behavioral disturbance ([place])    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[place]) 07/21/2018   Multiple fractures of ribs, left side, initial encounter for closed fracture 07/21/2018   Subdural hematoma ([place]) 07/21/2018   Gastritis, unspecified, with bleeding 06/04/2018   Angiodysplasia of stomach    Occult GI bleeding    GIB (gastrointestinal bleeding) 06/01/2018   Low back pain 01/17/2018   Anorexia    Diverticulitis of intestine, part unspecified, without perforation or abscess without bleeding 08/07/2017  Dyspnea    Macrocytic anemia 06/04/2016   Thrombocytopenia (HCC) 06/04/2016   Exertional dyspnea 06/04/2016   Arm paresthesia, left 06/04/2016   Dyspnea on exertion 06/04/2016   Tenderness of right calf 06/04/2016   ESRD on dialysis ([place])  05/17/2015   Hyperlipidemia, unspecified 02/25/2015   Pruritus, unspecified 02/25/2015   Pain, unspecified 02/25/2015   Other specified coagulation defects ([place]) 02/25/2015   Iron deficiency anemia, unspecified 02/25/2015   Headache, unspecified 02/25/2015   Gout, unspecified 02/25/2015   Fever, unspecified 02/25/2015   Diarrhea, unspecified 02/25/2015   Acute on chronic anemia 02/25/2015   Acute embolism and thrombosis of left subclavian vein (HCC) 02/25/2015   Hypoglycemia    Weakness 02/18/2015   OSA (obstructive sleep apnea) 02/02/2015   Hyperkalemia 10/05/2014   ESRD (end stage renal disease) ([place]) 11/18/2013   BPH (benign prostatic hyperplasia) 11/22/2012   Peripheral vascular disease ([place]) 12/24/2011   Hyperparathyroidism ([place]) 12/24/2011   Hypothyroidism 12/24/2011   Allergic rhinitis 12/24/2011   Erectile dysfunction 12/24/2011   Non-insulin dependent type 2 diabetes mellitus ([place]) 09/03/2008   Hyperlipidemia 09/03/2008   CAD in native artery 09/03/2008   Essential hypertension 08/30/2008   PANCREATITIS, HX OF 08/30/2008   RENAL FAILURE, ACUTE, HX OF 08/30/2008   DIVERTICULOSIS, COLON 06/08/2003    Orientation RESPIRATION BLADDER Height & Weight     Self  Normal Incontinent Weight: 155 lb 3.3 oz (70.4 kg) Height:  '5\' 8"'$  (172.7 cm)  BEHAVIORAL SYMPTOMS/MOOD NEUROLOGICAL BOWEL NUTRITION STATUS    Convulsions/Seizures Continent Diet (see discharge summary)  AMBULATORY STATUS COMMUNICATION OF NEEDS Skin   Total Care Verbally Surgical wounds                       Personal Care Assistance Level of Assistance  Bathing, Feeding, Dressing, Total care Bathing Assistance: Maximum assistance Feeding assistance: Maximum assistance Dressing Assistance: Maximum assistance Total Care Assistance: Maximum assistance   Functional Limitations Info  Sight, Hearing, Speech Sight Info: Adequate Hearing Info: Adequate Speech Info: Adequate    SPECIAL CARE FACTORS  FREQUENCY  PT (By licensed PT), OT (By licensed OT)     PT Frequency: 5x week OT Frequency: 5x week            Contractures Contractures Info: Present    Additional Factors Info  Code Status, Allergies Code Status Info: full Allergies Info: Ambien (Zolpidem Tartrate), Penicillins           Current Medications (08/27/2022):  This is the current hospital active medication list Current Facility-Administered Medications  Medication Dose Route Frequency Provider Last Rate Last Admin   acetaminophen (TYLENOL) tablet 325-650 mg  325-650 mg Oral Q6H PRN Georgeanna Harrison, MD       acetaminophen (TYLENOL) tablet 500 mg  500 mg Oral Q6H Georgeanna Harrison, MD   500 mg at 08/27/22 1321   acetaminophen (TYLENOL) tablet 650 mg  650 mg Oral Q6H PRN Irene Pap N, DO       allopurinol (ZYLOPRIM) tablet 100 mg  100 mg Oral Daily Elgergawy, Silver Huguenin, MD   100 mg at 08/27/22 0957   butalbital-acetaminophen-caffeine (FIORICET) 50-325-40 MG per tablet 1 tablet  1 tablet Oral Q6H PRN Kayleen Memos, DO       camphor-menthol (SARNA) lotion 1 Application  1 Application Topical QID PRN Irene Pap N, DO       ceFAZolin (ANCEF) IVPB 1 g/50 mL premix  1 g Intravenous Q24 Hr x 2 Elgergawy, Silver Huguenin, MD 100  mL/hr at 08/26/22 1738 1 g at 08/26/22 1738   Chlorhexidine Gluconate Cloth 2 % PADS 6 each  6 each Topical Q0600 Roney Jaffe, MD   6 each at 08/27/22 0515   docusate sodium (COLACE) capsule 100 mg  100 mg Oral BID Georgeanna Harrison, MD   100 mg at 08/27/22 0957   donepezil (ARICEPT) tablet 10 mg  10 mg Oral QHS Elgergawy, Silver Huguenin, MD   10 mg at 08/26/22 2030   doxercalciferol (HECTOROL) injection 5 mcg  5 mcg Intravenous Q M,W,F-HD Roney Jaffe, MD       heparin injection 5,000 Units  5,000 Units Subcutaneous Q8H Kayleen Memos, DO   5,000 Units at 08/27/22 2595   HYDROcodone-acetaminophen (NORCO) 7.5-325 MG per tablet 1-2 tablet  1-2 tablet Oral Q4H PRN Georgeanna Harrison, MD        HYDROcodone-acetaminophen (NORCO/VICODIN) 5-325 MG per tablet 1-2 tablet  1-2 tablet Oral Q4H PRN Georgeanna Harrison, MD   1 tablet at 08/27/22 1027   HYDROmorphone (DILAUDID) injection 0.5 mg  0.5 mg Intravenous Q4H PRN Irene Pap N, DO   0.5 mg at 08/27/22 0510   insulin aspart (novoLOG) injection 0-6 Units  0-6 Units Subcutaneous TID WC Elgergawy, Silver Huguenin, MD   1 Units at 08/26/22 1806   lacosamide (VIMPAT) tablet 100 mg  100 mg Oral BID Irene Pap N, DO   100 mg at 08/27/22 0957   levothyroxine (SYNTHROID) tablet 50 mcg  50 mcg Oral Daily Kayleen Memos, DO   50 mcg at 08/27/22 6387   melatonin tablet 5 mg  5 mg Oral QHS PRN Kayleen Memos, DO       menthol-cetylpyridinium (CEPACOL) lozenge 3 mg  1 lozenge Oral PRN Georgeanna Harrison, MD       Or   phenol (CHLORASEPTIC) mouth spray 1 spray  1 spray Mouth/Throat PRN Georgeanna Harrison, MD       metoCLOPramide (REGLAN) tablet 5-10 mg  5-10 mg Oral Q8H PRN Georgeanna Harrison, MD       Or   metoCLOPramide (REGLAN) injection 5-10 mg  5-10 mg Intravenous Q8H PRN Georgeanna Harrison, MD       midodrine (PROAMATINE) tablet 10 mg  10 mg Oral Q M,W,F Hall, Carole N, DO       oxyCODONE-acetaminophen (PERCOCET/ROXICET) 5-325 MG per tablet 1-2 tablet  1-2 tablet Oral Q6H PRN Irene Pap N, DO       pantoprazole (PROTONIX) EC tablet 40 mg  40 mg Oral Daily Irene Pap N, DO   40 mg at 08/27/22 0957   polyethylene glycol (MIRALAX / GLYCOLAX) packet 17 g  17 g Oral Daily PRN Irene Pap N, DO       polyethylene glycol (MIRALAX / GLYCOLAX) packet 17 g  17 g Oral BID Elgergawy, Silver Huguenin, MD       prochlorperazine (COMPAZINE) injection 5 mg  5 mg Intravenous Q6H PRN Irene Pap N, DO       QUEtiapine (SEROQUEL) tablet 25 mg  25 mg Oral QHS Hall, Carole N, DO   25 mg at 08/26/22 2030   rosuvastatin (CRESTOR) tablet 10 mg  10 mg Oral Daily Irene Pap N, DO   10 mg at 08/27/22 0957   senna-docusate (Senokot-S) tablet 1 tablet  1 tablet Oral QHS Irene Pap N, DO   1 tablet  at 08/26/22 2030     Discharge Medications: Please see discharge summary for a list of discharge medications.  Relevant Imaging Results:  Relevant Lab Results:   Additional Information SSN# [id number]  , HD MWF /[facility]  Joanne Chars, LCSW

## 2022-08-27 NOTE — Consult Note (Signed)
   [facility] Inpatient Consult   08/27/2022  [name] January 07, 1941 [id number]  [facility] [ACO] Patient:  Primary Care Provider:  Elby Showers, MD   Patient screened for hospitalization with noted extreme high risk score for unplanned readmission risk score for unplanned readmission to assess for potential [facility] service needs for post hospital transition for care coordination.  Review of patient's electronic medical record reveals patient is at 3:37 pm patient is currently off the floor.  Plan:  Continue to follow progress and disposition to assess for post hospital community care coordination/management needs.  Referral request for community care coordination: continue to follow for disposition and needs  Of note, [place] does not replace or interfere with any arrangements made by the Inpatient Transition of Care team.  For questions contact:   Natividad Brood, RN BSN [place]  [phone] business mobile phone Toll free office [phone]  *[place]  [phone] Fax number: [phone] [email or web address]

## 2022-08-27 NOTE — Progress Notes (Signed)
PROGRESS NOTE    [name]  YWV:[id number] DOB: 12/17/1940 DOA: 08/25/2022 PCP: Elby Showers, MD   Chief Complaint  Patient presents with   Fall   Lt hip pain    Brief Narrative:    [name] is a 81 y.o. male with medical history significant for Alzheimer's dementia, ESRD on HD TTS, hypertension, type 2 diabetes, coronary artery disease, peripheral vascular disease, who presented to [facility] ED after a fall at home.  He was walking to the bathroom when he fell and landed on his left hip.  He could not bear weight on his left hip after the fall.  EMS was activated and he was brought to the ED for further evaluation.  He was found to have left hip fracture, and he is admitted for further workup.    Assessment & Plan:   Principal Problem:   S/p left hip fracture  Fall status post left hip fracture, POA -S/p surgical repair by Dr. Mable Fill 12/3 -New with as needed pain medication -  PT/OT consulted, recommendation for SNF.    ESRD on HD TTS -Nephrology consulted.  Continue hemodialysis on Monday Wednesday Friday schedule  Hyperkalemia Serum potassium 5.3 on admission, received Lokelma, it is 6.1 this morning, will go for HD.   Hypertension BPs are currently soft, actually he is on midodrine.   Hypothyroidism Resume home levothyroxine   Dementia/mood disorder Resume home regimen. Reorient as needed Fall precautions Delirium precautions   Diabetes mellitus -Will hold Prandin and Tradjenta and keep an insulin sliding scale during hospital stay.   DVT prophylaxis: [place] heparin Code Status: Full code Family Communication: None at bedside Disposition:   Status is: Inpatient    Consultants:  Orthopedic Renal   Subjective:  reports his pain is controlled today.  Objective: Vitals:   08/26/22 1929 08/26/22 2009 08/27/22 0505 08/27/22 0727  BP:  134/75 108/60 (!) 135/51  Pulse:    96  Resp:  18    Temp:  98.6 F (37 C) 98 F (36.7 C) 98.3 F (36.8 C)   TempSrc:  Oral  Oral  SpO2:  91% 96% 90%  Weight: 70.4 kg     Height: '5\' 8"'$  (1.727 m)       Intake/Output Summary (Last 24 hours) at 08/27/2022 1358 Last data filed at 08/27/2022 0957 Gross per 24 hour  Intake 220 ml  Output --  Net 220 ml   Filed Weights   08/26/22 1929  Weight: 70.4 kg    Examination:  Awake Alert, gently confused, frail, deconditioned Symmetrical Chest wall movement, Good air movement bilaterally, CTAB RRR,No Gallops,Rubs or new Murmurs, No Parasternal Heave +ve B.Sounds, Abd Soft, No tenderness, No rebound - guarding or rigidity. No Cyanosis, Clubbing or edema, No new Rash or bruise      Data Reviewed: I have personally reviewed following labs and imaging studies  CBC: Recent Labs  Lab 08/25/22 1935 08/26/22 0455 08/27/22 1220  WBC 8.2 8.4 10.8*  HGB 11.3* 10.2* 8.4*  HCT 34.0* 29.7* 25.0*  MCV 105.6* 100.0 102.5*  PLT 146* 144* 147*    Basic Metabolic Panel: Recent Labs  Lab 08/25/22 1935 08/26/22 0455 08/27/22 1219 08/27/22 1220  NA 134* 134*  --  134*  K 5.3* 5.1  --  6.1*  CL 96* 92*  --  94*  CO2 24 28  --  26  GLUCOSE 86 99  --  116*  BUN 28* 34*  --  51*  CREATININE 5.54*  6.10*  --  8.40*  CALCIUM 10.0 9.9  --  9.3  MG  --  2.3  --   --   PHOS  --  5.6* 7.8* 7.9*    GFR: Estimated Creatinine Clearance: 6.7 mL/min (A) (by C-G formula based on SCr of 8.4 mg/dL (H)).  Liver Function Tests: Recent Labs  Lab 08/26/22 0455 08/27/22 1220  ALBUMIN 2.8* 2.7*    CBG: Recent Labs  Lab 08/26/22 1309 08/26/22 1731 08/26/22 2007 08/27/22 1014 08/27/22 1324  GLUCAP 110* 174* 125* 102* 106*     Recent Results (from the past 240 hour(s))  Resp Panel by RT-PCR (Flu A&B, Covid) Anterior Nasal Swab     Status: None   Collection Time: 08/17/22  7:34 PM   Specimen: Anterior Nasal Swab  Result Value Ref Range Status   SARS Coronavirus 2 by RT PCR NEGATIVE NEGATIVE Final    Comment: (NOTE) SARS-CoV-2 target nucleic acids  are NOT DETECTED.  The SARS-CoV-2 RNA is generally detectable in upper respiratory specimens during the acute phase of infection. The lowest concentration of SARS-CoV-2 viral copies this assay can detect is 138 copies/mL. A negative result does not preclude SARS-Cov-2 infection and should not be used as the sole basis for treatment or other patient management decisions. A negative result may occur with  improper specimen collection/handling, submission of specimen other than nasopharyngeal swab, presence of viral mutation(s) within the areas targeted by this assay, and inadequate number of viral copies(<138 copies/mL). A negative result must be combined with clinical observations, patient history, and epidemiological information. The expected result is Negative.  Fact Sheet for Patients:  EntrepreneurPulse.com.au  Fact Sheet for Healthcare Providers:  IncredibleEmployment.be  This test is no t yet approved or cleared by the [place] FDA and  has been authorized for detection and/or diagnosis of SARS-CoV-2 by FDA under an Emergency Use Authorization (EUA). This EUA will remain  in effect (meaning this test can be used) for the duration of the COVID-19 declaration under Section 564(b)(1) of the Act, 21 U.S.C.section 360bbb-3(b)(1), unless the authorization is terminated  or revoked sooner.       Influenza A by PCR NEGATIVE NEGATIVE Final   Influenza B by PCR NEGATIVE NEGATIVE Final    Comment: (NOTE) The Xpert Xpress SARS-CoV-2/FLU/RSV plus assay is intended as an aid in the diagnosis of influenza from Nasopharyngeal swab specimens and should not be used as a sole basis for treatment. Nasal washings and aspirates are unacceptable for Xpert Xpress SARS-CoV-2/FLU/RSV testing.  Fact Sheet for Patients: EntrepreneurPulse.com.au  Fact Sheet for Healthcare Providers: IncredibleEmployment.be  This test is  not yet approved or cleared by the [place] FDA and has been authorized for detection and/or diagnosis of SARS-CoV-2 by FDA under an Emergency Use Authorization (EUA). This EUA will remain in effect (meaning this test can be used) for the duration of the COVID-19 declaration under Section 564(b)(1) of the Act, 21 U.S.C. section 360bbb-3(b)(1), unless the authorization is terminated or revoked.  Performed at [facility], [place] [address] [place], [place] [address]   Surgical pcr screen     Status: None   Collection Time: 08/26/22  9:56 AM   Specimen: Nasal Mucosa; Nasal Swab  Result Value Ref Range Status   MRSA, PCR NEGATIVE NEGATIVE Final   Staphylococcus aureus NEGATIVE NEGATIVE Final    Comment: (NOTE) The Xpert SA Assay (FDA approved for NASAL specimens in patients 26 years of age and older), is one component of a  comprehensive surveillance program. It is not intended to diagnose infection nor to guide or monitor treatment. Performed at [facility], [place] [address], [place], [place] [address]          Radiology Studies: DG HIP UNILAT WITH PELVIS 2-3 VIEWS LEFT  Result Date: 08/26/2022 CLINICAL DATA:  Status post internal fixation of fracture of left femur EXAM: DG HIP (WITH OR WITHOUT PELVIS) 2-3V LEFT COMPARISON:  08/25/2022 FINDINGS: There is interval internal fixation of intertrochanteric fracture of left femur with intramedullary rod and surgical screw. There is previous internal fixation in the proximal right femur. Extensive arterial calcifications are seen. There are surgical clips in both inguinal regions, possibly suggesting previous vascular intervention. There is evidence of vertebroplasty in lower lumbar spine. IMPRESSION: Interval internal fixation of intertrochanteric fracture of left femur. Severe arteriosclerosis. Electronically Signed   By: Elmer Picker M.D.   On: 08/26/2022 13:35   DG FEMUR MIN 2 VIEWS LEFT  Result Date:  08/26/2022 CLINICAL DATA:  Left hip arthroplasty Fluoroscopy time: 65.6 seconds. Images: 6 Air kerma: 11.64 mGy EXAM: LEFT FEMUR 2 VIEWS COMPARISON:  August 25, 2022 FINDINGS: Images were obtained during repair of the left hip fracture. A gamma nail and intramedullary rod are identified by the end of the study with a distal interlocking screw. IMPRESSION: Left hip arthroplasty as above. Electronically Signed   By: Dorise Bullion III M.D.   On: 08/26/2022 11:55   DG C-Arm 1-60 Min-No Report  Result Date: 08/26/2022 Fluoroscopy was utilized by the requesting physician.  No radiographic interpretation.   CT Lumbar Spine Wo Contrast  Result Date: 08/25/2022 CLINICAL DATA:  Fall EXAM: CT THORACIC AND LUMBAR SPINE WITHOUT CONTRAST TECHNIQUE: Multidetector CT imaging of the thoracic and lumbar spine was performed without contrast. Multiplanar CT image reconstructions were also generated. RADIATION DOSE REDUCTION: This exam was performed according to the departmental dose-optimization program which includes automated exposure control, adjustment of the mA and/or kV according to patient size and/or use of iterative reconstruction technique. COMPARISON:  CTA chest 01/12/2021 CT abdomen pelvis 08/17/2022 FINDINGS: CT THORACIC SPINE FINDINGS Alignment: Normal. Vertebrae: Height loss at T1, T3 and T8 is unchanged. No acute fracture. Paraspinal and other soft tissues: Calcific aortic atherosclerosis. Disc levels: No spinal canal stenosis. CT LUMBAR SPINE FINDINGS Segmentation: 5 lumbar type vertebrae. Alignment: Normal. Vertebrae: Unchanged vertebra plana configuration of L1 compared to 08/17/2022. Remote augmentation of L3 with cement extending into the L2-3 disc space. Height loss at the other lumbar levels is also unchanged. Paraspinal and other soft tissues: Calcific aortic atherosclerosis. Infrarenal aortic graft. 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 IMPRESSION: 1. No acute fracture or static  subluxation of the thoracic or lumbar spine. 2. Unchanged vertebra plana configuration of L1 compared to 08/17/2022. 3. Remote augmentation of L3 with cement extending into the L2-3 disc space. 4. 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 5. Chronic compression deformities of T1, T3 and T8 are unchanged since 01/12/2021. Aortic Atherosclerosis (ICD10-I70.0). Electronically Signed   By: Ulyses Jarred M.D.   On: 08/25/2022 20:16   CT Thoracic Spine Wo Contrast  Result Date: 08/25/2022 CLINICAL DATA:  Fall EXAM: CT THORACIC AND LUMBAR SPINE WITHOUT CONTRAST TECHNIQUE: Multidetector CT imaging of the thoracic and lumbar spine was performed without contrast. Multiplanar CT image reconstructions were also generated. RADIATION DOSE REDUCTION: This exam was performed according to the departmental dose-optimization program which includes automated exposure control, adjustment of the mA and/or kV according  to patient size and/or use of iterative reconstruction technique. COMPARISON:  CTA chest 01/12/2021 CT abdomen pelvis 08/17/2022 FINDINGS: CT THORACIC SPINE FINDINGS Alignment: Normal. Vertebrae: Height loss at T1, T3 and T8 is unchanged. No acute fracture. Paraspinal and other soft tissues: Calcific aortic atherosclerosis. Disc levels: No spinal canal stenosis. CT LUMBAR SPINE FINDINGS Segmentation: 5 lumbar type vertebrae. Alignment: Normal. Vertebrae: Unchanged vertebra plana configuration of L1 compared to 08/17/2022. Remote augmentation of L3 with cement extending into the L2-3 disc space. Height loss at the other lumbar levels is also unchanged. Paraspinal and other soft tissues: Calcific aortic atherosclerosis. Infrarenal aortic graft. 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 IMPRESSION: 1. No acute fracture or static subluxation of the thoracic or lumbar spine. 2. Unchanged vertebra plana configuration of L1 compared to 08/17/2022. 3. Remote augmentation of L3 with cement extending into  the L2-3 disc space. 4. 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 5. Chronic compression deformities of T1, T3 and T8 are unchanged since 01/12/2021. Aortic Atherosclerosis (ICD10-I70.0). Electronically Signed   By: Ulyses Jarred M.D.   On: 08/25/2022 20:16   DG Hip Unilat W or Wo Pelvis 2-3 Views Left  Result Date: 08/25/2022 CLINICAL DATA:  Left hip pain. EXAM: DG HIP (WITH OR WITHOUT PELVIS) 3V LEFT COMPARISON:  Left hip radiographs 11/03/2021. FINDINGS: A minimally displaced intratrochanteric fracture is present in the proximal left femur. The hip joint is located. Extensive vascular calcifications are present. Right hip ORIF noted. Bowel gas pattern is normal. IMPRESSION: 1. Minimally displaced intratrochanteric fracture of the proximal left femur. 2. Extensive vascular calcifications. Electronically Signed   By: San Morelle M.D.   On: 08/25/2022 19:34        Scheduled Meds:  acetaminophen  500 mg Oral Q6H   allopurinol  100 mg Oral Daily   Chlorhexidine Gluconate Cloth  6 each Topical Q0600   docusate sodium  100 mg Oral BID   donepezil  10 mg Oral QHS   doxercalciferol  5 mcg Intravenous Q M,W,F-HD   heparin  5,000 Units Subcutaneous Q8H   insulin aspart  0-6 Units Subcutaneous TID WC   lacosamide  100 mg Oral BID   levothyroxine  50 mcg Oral Daily   midodrine  10 mg Oral Q M,W,F   pantoprazole  40 mg Oral Daily   QUEtiapine  25 mg Oral QHS   rosuvastatin  10 mg Oral Daily   senna-docusate  1 tablet Oral QHS   Continuous Infusions:   ceFAZolin (ANCEF) IV 1 g (08/26/22 1738)     LOS: 2 days        Phillips Climes, MD [facility]   To contact the attending provider between 7A-7P or the covering provider during after hours 7P-7A, please log into the web site [email or web address] and access using universal [place] password for that web site. If you do not have the password, please call the hospital operator.  08/27/2022, 1:58 PM

## 2022-08-27 NOTE — Progress Notes (Signed)
Orthopaedics Daily Progress Note   08/27/2022   7:39 AM  [name] is a 81 y.o. male 1 Day Post-Op s/p INTRAMEDULLARY (IM) NAIL INTERTROCHANTERIC  Subjective Pain well controlled.  Resting comfortably.  Objective Vitals:   08/27/22 0505 08/27/22 0727  BP: 108/60 (!) 135/51  Pulse:  96  Resp:    Temp: 98 F (36.7 C) 98.3 F (36.8 C)  SpO2: 96% 90%    Intake/Output Summary (Last 24 hours) at 08/27/2022 0981 Last data filed at 08/26/2022 2009 Gross per 24 hour  Intake 300 ml  Output 150 ml  Net 150 ml    Physical Exam LLE: Dressing clean, dry, and intact +DF/PF/EHL Unable to comply with sensory exam due to mental status +DP/PT and WWP distally  Assessment 81 y.o. male s/p Procedure(s) (LRB): INTRAMEDULLARY (IM) NAIL INTERTROCHANTERIC (Left)  Plan Mobility: Out of bed with PT/OT Pain control: Continue to wean/titrate to appropriate oral regimen DVT Prophylaxis: Defer to primary medical team for DVT prophylaxis given CKD. Recommend continuing pharmacologic prophylaxis for 6 weeks postoperatively.  Further surgical plans: None LLE: Weightbearing as tolerated, no restrictions Dressing care: Keep AQUACEL on and dry for up to 14 days.  Do not allow surgical area to get wet before that.  Remove AQUACEL dressing after 14 days and allow area to get wet in shower but DO NOT SUBMERGE until wound is evaluated in clinic.  In most cases skin glue is used and no additional dressing is necessary.  Disposition: Per primary team as medically appropriate Follow-up: Please call [place] [phone]) to schedule follow-op appointment for 2 weeks after surgery.  I have verified that my discharge instructions and follow-up information have been entered in the Discharge Navigator in Epic.  These should automatically populate in the AVS.  Please print the AVS in its entirety and ensure that the patient or a responsible party has a complete copy of the AVS  before they are discharged.  If there are questions regarding discharge instructions or follow-up before the AVS is generated, please check the Discharge Navigator before attempting to contact the surgeon/office.  If unsure how to access the Discharge Navigator or the information contained in the Discharge Navigator, or how to generate/print the AVS, please contact the appropriate Nurse, learning disability.   The patient's postoperative prescriptions (e.g. pain medication, anticoagulation) have been printed, signed, and placed in/on the patient's hard chart: Norco  Please note that patient will require pharmacologic VTE prophylaxis for 6 weeks postoperatively.  An additional script will be required for this medication.  Orthopaedics will defer choice of agent to primary medical team in setting of CKD and medical comorbidities.    Georgeanna Harrison M.D. Orthopaedic Surgery [facility]

## 2022-08-27 NOTE — Progress Notes (Signed)
[facility] Progress Note   Subjective:   no complaints - not oriented currently.  In good spirits.    Objective Vitals:   08/26/22 1929 08/26/22 2009 08/27/22 0505 08/27/22 0727  BP:  134/75 108/60 (!) 135/51  Pulse:    96  Resp:  18    Temp:  98.6 F (37 C) 98 F (36.7 C) 98.3 F (36.8 C)  TempSrc:  Oral  Oral  SpO2:  91% 96% 90%  Weight: 70.4 kg     Height: '5\' 8"'$  (1.727 m)      Physical Exam General: lying flat in bed comfortable Heart: RRR Lungs: clear Abdomen: soft Extremities: no edema Dialysis Access:  RUE AVF +t/b  Additional Objective Labs: Basic Metabolic Panel: Recent Labs  Lab 08/25/22 1935 08/26/22 0455  NA 134* 134*  K 5.3* 5.1  CL 96* 92*  CO2 24 28  GLUCOSE 86 99  BUN 28* 34*  CREATININE 5.54* 6.10*  CALCIUM 10.0 9.9  PHOS  --  5.6*   Liver Function Tests: Recent Labs  Lab 08/26/22 0455  ALBUMIN 2.8*   No results for input(s): "LIPASE", "AMYLASE" in the last 168 hours. CBC: Recent Labs  Lab 08/25/22 1935 08/26/22 0455  WBC 8.2 8.4  HGB 11.3* 10.2*  HCT 34.0* 29.7*  MCV 105.6* 100.0  PLT 146* 144*   Blood Culture    Component Value Date/Time   SDES URINE, CLEAN CATCH 04/09/2022 2051   [place]  04/09/2022 2051    NONE Performed at [facility] [address], [place], [place] [address]    CULT MULTIPLE SPECIES PRESENT, SUGGEST RECOLLECTION (A) 04/09/2022 2051   REPTSTATUS 04/11/2022 FINAL 04/09/2022 2051    Cardiac Enzymes: No results for input(s): "CKTOTAL", "CKMB", "CKMBINDEX", "TROPONINI" in the last 168 hours. CBG: Recent Labs  Lab 08/26/22 0444 08/26/22 1016 08/26/22 1309 08/26/22 1731 08/26/22 2007  GLUCAP 91 82 110* 174* 125*   Iron Studies: No results for input(s): "IRON", "TIBC", "TRANSFERRIN", "FERRITIN" in the last 72 hours. '@lablastinr3'$ @ Studies/Results: DG HIP UNILAT WITH PELVIS 2-3 VIEWS LEFT  Result Date: 08/26/2022 CLINICAL DATA:  Status post internal fixation of  fracture of left femur EXAM: DG HIP (WITH OR WITHOUT PELVIS) 2-3V LEFT COMPARISON:  08/25/2022 FINDINGS: There is interval internal fixation of intertrochanteric fracture of left femur with intramedullary rod and surgical screw. There is previous internal fixation in the proximal right femur. Extensive arterial calcifications are seen. There are surgical clips in both inguinal regions, possibly suggesting previous vascular intervention. There is evidence of vertebroplasty in lower lumbar spine. IMPRESSION: Interval internal fixation of intertrochanteric fracture of left femur. Severe arteriosclerosis. Electronically Signed   By: Elmer Picker M.D.   On: 08/26/2022 13:35   DG FEMUR MIN 2 VIEWS LEFT  Result Date: 08/26/2022 CLINICAL DATA:  Left hip arthroplasty Fluoroscopy time: 65.6 seconds. Images: 6 Air kerma: 11.64 mGy EXAM: LEFT FEMUR 2 VIEWS COMPARISON:  August 25, 2022 FINDINGS: Images were obtained during repair of the left hip fracture. A gamma nail and intramedullary rod are identified by the end of the study with a distal interlocking screw. IMPRESSION: Left hip arthroplasty as above. Electronically Signed   By: Dorise Bullion III M.D.   On: 08/26/2022 11:55   DG C-Arm 1-60 Min-No Report  Result Date: 08/26/2022 Fluoroscopy was utilized by the requesting physician.  No radiographic interpretation.   CT Lumbar Spine Wo Contrast  Result Date: 08/25/2022 CLINICAL DATA:  Fall EXAM: CT THORACIC AND LUMBAR SPINE  WITHOUT CONTRAST TECHNIQUE: Multidetector CT imaging of the thoracic and lumbar spine was performed without contrast. Multiplanar CT image reconstructions were also generated. RADIATION DOSE REDUCTION: This exam was performed according to the departmental dose-optimization program which includes automated exposure control, adjustment of the mA and/or kV according to patient size and/or use of iterative reconstruction technique. COMPARISON:  CTA chest 01/12/2021 CT abdomen pelvis  08/17/2022 FINDINGS: CT THORACIC SPINE FINDINGS Alignment: Normal. Vertebrae: Height loss at T1, T3 and T8 is unchanged. No acute fracture. Paraspinal and other soft tissues: Calcific aortic atherosclerosis. Disc levels: No spinal canal stenosis. CT LUMBAR SPINE FINDINGS Segmentation: 5 lumbar type vertebrae. Alignment: Normal. Vertebrae: Unchanged vertebra plana configuration of L1 compared to 08/17/2022. Remote augmentation of L3 with cement extending into the L2-3 disc space. Height loss at the other lumbar levels is also unchanged. Paraspinal and other soft tissues: Calcific aortic atherosclerosis. Infrarenal aortic graft. 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 IMPRESSION: 1. No acute fracture or static subluxation of the thoracic or lumbar spine. 2. Unchanged vertebra plana configuration of L1 compared to 08/17/2022. 3. Remote augmentation of L3 with cement extending into the L2-3 disc space. 4. 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 5. Chronic compression deformities of T1, T3 and T8 are unchanged since 01/12/2021. Aortic Atherosclerosis (ICD10-I70.0). Electronically Signed   By: Ulyses Jarred M.D.   On: 08/25/2022 20:16   CT Thoracic Spine Wo Contrast  Result Date: 08/25/2022 CLINICAL DATA:  Fall EXAM: CT THORACIC AND LUMBAR SPINE WITHOUT CONTRAST TECHNIQUE: Multidetector CT imaging of the thoracic and lumbar spine was performed without contrast. Multiplanar CT image reconstructions were also generated. RADIATION DOSE REDUCTION: This exam was performed according to the departmental dose-optimization program which includes automated exposure control, adjustment of the mA and/or kV according to patient size and/or use of iterative reconstruction technique. COMPARISON:  CTA chest 01/12/2021 CT abdomen pelvis 08/17/2022 FINDINGS: CT THORACIC SPINE FINDINGS Alignment: Normal. Vertebrae: Height loss at T1, T3 and T8 is unchanged. No acute fracture. Paraspinal and other soft tissues:  Calcific aortic atherosclerosis. Disc levels: No spinal canal stenosis. CT LUMBAR SPINE FINDINGS Segmentation: 5 lumbar type vertebrae. Alignment: Normal. Vertebrae: Unchanged vertebra plana configuration of L1 compared to 08/17/2022. Remote augmentation of L3 with cement extending into the L2-3 disc space. Height loss at the other lumbar levels is also unchanged. Paraspinal and other soft tissues: Calcific aortic atherosclerosis. Infrarenal aortic graft. 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 IMPRESSION: 1. No acute fracture or static subluxation of the thoracic or lumbar spine. 2. Unchanged vertebra plana configuration of L1 compared to 08/17/2022. 3. Remote augmentation of L3 with cement extending into the L2-3 disc space. 4. 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 5. Chronic compression deformities of T1, T3 and T8 are unchanged since 01/12/2021. Aortic Atherosclerosis (ICD10-I70.0). Electronically Signed   By: Ulyses Jarred M.D.   On: 08/25/2022 20:16   DG Hip Unilat W or Wo Pelvis 2-3 Views Left  Result Date: 08/25/2022 CLINICAL DATA:  Left hip pain. EXAM: DG HIP (WITH OR WITHOUT PELVIS) 3V LEFT COMPARISON:  Left hip radiographs 11/03/2021. FINDINGS: A minimally displaced intratrochanteric fracture is present in the proximal left femur. The hip joint is located. Extensive vascular calcifications are present. Right hip ORIF noted. Bowel gas pattern is normal. IMPRESSION: 1. Minimally displaced intratrochanteric fracture of the proximal left femur. 2. Extensive vascular calcifications. Electronically Signed   By: San Morelle M.D.   On: 08/25/2022  19:34   Medications:   ceFAZolin (ANCEF) IV 1 g (08/26/22 1738)    acetaminophen  500 mg Oral Q6H   allopurinol  100 mg Oral Daily   Chlorhexidine Gluconate Cloth  6 each Topical Q0600   docusate sodium  100 mg Oral BID   donepezil  10 mg Oral QHS   doxercalciferol  5 mcg Intravenous Q M,W,F-HD   heparin  5,000 Units  Subcutaneous Q8H   insulin aspart  0-6 Units Subcutaneous TID WC   lacosamide  100 mg Oral BID   levothyroxine  50 mcg Oral Daily   midodrine  10 mg Oral Q M,W,F   pantoprazole  40 mg Oral Daily   QUEtiapine  25 mg Oral QHS   rosuvastatin  10 mg Oral Daily   senna-docusate  1 tablet Oral QHS     OP HD: [facility] M[facility]  3h 48mn  400/ 1.5  66kg  2/2 bath  Hep none  RUA AVF - last HD 12/1, post wt 66.7kg - doxercalciferol 8 ug IV mwf - no esa, last Hb 11.7     Assessment/ Plan: L hip fracture - sp fall. Orthopedic surg took to OR 12/3 for ORIF.  Discontinued morphine due to ESRD.  Please use IV fentanyl or hydromorphone or oral oxycodone.  ESRD - on HD MWF. Has not missed HD. HD today.  Hyperkalemia - mild, K+ 5.1 12/3. No labs today yet.  Dementia/ mood disorder - on medications BP/ volume - takes midodrine pre HD for low BP's. Not on any BP lowering meds. 4kg up by wts.  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 Phos 5.6. .  LJannifer HickMD 08/27/2022, 8:19 AM  [facility] Pager: [phone]

## 2022-08-27 NOTE — Evaluation (Signed)
Occupational Therapy Evaluation Patient Details Name: [name] MRN: [id number] DOB: August 29, 1941 Today's Date: 08/27/2022   History of Present Illness [name] is a 81 y.o. male who presented following fall to the L side while ambulating to the bathroom. Pt was found to have a fractured hip and is now post op day 1 ORIF.  PMHx of Alzheimers, HTN, hypothyroidism, OSA, ESRD on dialysis, PVD, GERD, diabetes, CAD.   Clinical Impression   This 81 yo male admitted with above presents to acute OT with PLOF of being able to do some of his BADLs and ambulating with a RW. Currently he is total A for all basic ADLs requiring total A +2 for bed mobility and supine<>sit. He will continue to benefit from acute OT with follow up at SNF.      Recommendations for follow up therapy are one component of a multi-disciplinary discharge planning process, led by the attending physician.  Recommendations may be updated based on patient status, additional functional criteria and insurance authorization.   Follow Up Recommendations  Skilled nursing-short term rehab (<3 hours/day)     Assistance Recommended at Discharge Frequent or constant Supervision/Assistance  Patient can return home with the following Two people to help with walking and/or transfers;Two people to help with bathing/dressing/bathroom;Assistance with feeding;Help with stairs or ramp for entrance;Assist for transportation;Direct supervision/assist for financial management;Direct supervision/assist for medications management    Functional Status Assessment  Patient has had a recent decline in their functional status and demonstrates the ability to make significant improvements in function in a reasonable and predictable amount of time.  Equipment Recommendations  Other (comment) (TBD next venue)       Precautions / Restrictions Precautions Precautions: Fall Restrictions Weight Bearing Restrictions: No LLE Weight Bearing: Weight  bearing as tolerated      Mobility Bed Mobility Overal bed mobility: Needs Assistance Bed Mobility: Supine to Sit, Sit to Supine     Supine to sit: +2 for physical assistance, Total assist Sit to supine: +2 for physical assistance, Total assist   General bed mobility comments: Pt demonstrates significant difficutly with sequencing activity and was unable to follow directions well today.    Transfers                   General transfer comment: Pt was unable to transfer this date.(unsafe due to pt leaning and pushing posteriorly while seated EOB      Balance Overall balance assessment: History of Falls, Needs assistance Sitting-balance support: Bilateral upper extremity supported, Feet supported Sitting balance-Leahy Scale: Poor                                     ADL either performed or assessed with clinical judgement   ADL Overall ADL's : Needs assistance/impaired                                       General ADL Comments: total A for all care     Vision Baseline Vision/History:  (unknown--pt unable to tell us due to dementia)              Pertinent Vitals/Pain Pain Assessment Pain Assessment: PAINAD Breathing: occasional labored breathing, short period of hyperventilation Negative Vocalization: repeated troubled calling out, loud moaning/groaning, crying Facial Expression: facial grimacing Body Language: tense, distressed pacing,  fidgeting Consolability: distracted or reassured by voice/touch PAINAD Score: 7 Pain Location: suspect leg, but pt could not tell us Pain Descriptors / Indicators:  (unsure since pt with dementia) Pain Intervention(s): Premedicated before session, Repositioned, Limited activity within patient's tolerance, Monitored during session     Hand Dominance Right   Extremity/Trunk Assessment Upper Extremity Assessment Upper Extremity Assessment: Overall WFL for tasks assessed   Lower Extremity  Assessment Lower Extremity Assessment: Generalized weakness;LLE deficits/detail LLE Deficits / Details: WBAT Max A with LLE for bed mobility and any movement due to recent injury and surgery LLE: Unable to fully assess due to pain   Cervical / Trunk Assessment Cervical / Trunk Assessment: Kyphotic   Communication Communication Communication: HOH   Cognition Arousal/Alertness: Awake/alert Behavior During Therapy: Anxious Overall Cognitive Status: History of cognitive impairments - at baseline                                 General Comments: Dementia at baseline. Pt was perseverating on his spouse "shirley"     General Comments  Pt was received wearing mitts and was returned to supine position with mitts in place.            Home Living Family/patient expects to be discharged to:: Inpatient rehab Living Arrangements: Spouse/significant other Available Help at Discharge: Family;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 hours/day Type of Home: House Home Access: Stairs to enter [employer] of Steps: stair lift in the garage Entrance Stairs-Rails: Right;Left;Can reach both Home Layout: Two level;Able to live on main level with bedroom/bathroom     Bathroom Shower/Tub: Tub/shower unit;Sponge bathes at baseline   Bathroom Toilet: Handicapped height Bathroom Accessibility: Yes How Accessible: Accessible via walker Home Equipment: [place] (2 wheels);Hospital bed;Transport chair;Wheelchair - manual;Grab bars - toilet   Additional Comments: info gleaned from chart      Prior Functioning/Environment Prior Level of Function : Patient poor historian/Family not available             Mobility Comments: Per chart pt ambulates with "rolling walker" ADLs Comments: unable to report - per chart family assists as needed        OT Problem List: Decreased strength;Decreased range of motion;Decreased activity tolerance;Impaired balance (sitting and/or standing);Decreased  cognition;Decreased safety awareness;Decreased knowledge of use of DME or AE;Pain      OT Treatment/Interventions: Self-care/ADL training;DME and/or AE instruction;Balance training;Patient/family education;Therapeutic exercise    OT Goals(Current goals can be found in the care plan section) Acute Rehab OT Goals Patient Stated Goal: unable OT Goal Formulation: Patient unable to participate in goal setting Time For Goal Achievement: 09/10/22 Potential to Achieve Goals: Fair  OT Frequency: Min 2X/week    Co-evaluation PT/OT/SLP Co-Evaluation/Treatment: Yes Reason for Co-Treatment: Complexity of the patient's impairments (multi-system involvement);Necessary to address cognition/behavior during functional activity;For patient/therapist safety PT goals addressed during session: Mobility/safety with mobility OT goals addressed during session: Strengthening/ROM      AM-PAC OT "6 Clicks" Daily Activity     Outcome Measure Help from another person eating meals?: Total Help from another person taking care of personal grooming?: Total Help from another person toileting, which includes using toliet, bedpan, or urinal?: Total Help from another person bathing (including washing, rinsing, drying)?: Total Help from another person to put on and taking off regular upper body clothing?: Total Help from another person to put on and taking off regular lower body clothing?: Total 6 Click Score: 6   End of Session  Nurse Communication: Mobility status  Activity Tolerance: Patient limited by pain Patient left: in bed;with call bell/phone within reach;with bed alarm set;with restraints reapplied (Bil mitts)  OT Visit Diagnosis: History of falling (Z91.81);Muscle weakness (generalized) (M62.81);Other abnormalities of gait and mobility (R26.89);Other symptoms and signs involving cognitive function;Pain Pain - Right/Left: Left Pain - part of body: Leg                Time: 1047-1107 OT Time Calculation  (min): 20 min Charges:  OT General Charges $OT Visit: 1 Visit OT Evaluation $OT Eval Moderate Complexity: Elbert, OTR/L Acute [employer] [facility] [phone] Office [phone]    Almon Register 08/27/2022, 12:40 PM

## 2022-08-27 NOTE — Progress Notes (Signed)
Inpatient Rehab Admissions Coordinator:   Pt. Screened for CIR candidacy at the request of TOC (family asking). Pt. Is currently unable to tolerate OOB and is not at a level to tolerate intensity of CIR. I will not pursue consult at this time. Recommend TOC look toward other rehab venues.   Clemens Catholic, [place], [place] Admissions Coordinator  [phone] ([place]) [phone] (office)

## 2022-08-28 ENCOUNTER — Inpatient Hospital Stay: Payer: Medicare Other | Admitting: Internal Medicine

## 2022-08-28 ENCOUNTER — Other Ambulatory Visit: Payer: Medicare Other

## 2022-08-28 DIAGNOSIS — R413 Other amnesia: Secondary | ICD-10-CM

## 2022-08-28 DIAGNOSIS — I5022 Chronic systolic (congestive) heart failure: Secondary | ICD-10-CM

## 2022-08-28 DIAGNOSIS — Z992 Dependence on renal dialysis: Secondary | ICD-10-CM

## 2022-08-28 DIAGNOSIS — I4891 Unspecified atrial fibrillation: Secondary | ICD-10-CM

## 2022-08-28 DIAGNOSIS — F0390 Unspecified dementia without behavioral disturbance: Secondary | ICD-10-CM | POA: Diagnosis not present

## 2022-08-28 DIAGNOSIS — R718 Other abnormality of red blood cells: Secondary | ICD-10-CM

## 2022-08-28 DIAGNOSIS — I10 Essential (primary) hypertension: Secondary | ICD-10-CM

## 2022-08-28 DIAGNOSIS — N186 End stage renal disease: Secondary | ICD-10-CM | POA: Diagnosis not present

## 2022-08-28 DIAGNOSIS — Z87820 Personal history of traumatic brain injury: Secondary | ICD-10-CM

## 2022-08-28 DIAGNOSIS — Z8781 Personal history of (healed) traumatic fracture: Secondary | ICD-10-CM | POA: Diagnosis not present

## 2022-08-28 DIAGNOSIS — Z8739 Personal history of other diseases of the musculoskeletal system and connective tissue: Secondary | ICD-10-CM

## 2022-08-28 LAB — RENAL FUNCTION PANEL
Albumin: 2.4 g/dL — ABNORMAL LOW (ref 3.5–5.0)
Anion gap: 14 (ref 5–15)
BUN: 25 mg/dL — ABNORMAL HIGH (ref 8–23)
CO2: 27 mmol/L (ref 22–32)
Calcium: 9.4 mg/dL (ref 8.9–10.3)
Chloride: 96 mmol/L — ABNORMAL LOW (ref 98–111)
Creatinine, Ser: 4.85 mg/dL — ABNORMAL HIGH (ref 0.61–1.24)
GFR, Estimated: 11 mL/min — ABNORMAL LOW (ref >60)
Glucose, Bld: 65 mg/dL — ABNORMAL LOW (ref 70–99)
Phosphorus: 5.2 mg/dL — ABNORMAL HIGH (ref 2.5–4.6)
Potassium: 4.3 mmol/L (ref 3.5–5.1)
Sodium: 137 mmol/L (ref 135–145)

## 2022-08-28 LAB — GLUCOSE, CAPILLARY
Glucose-Capillary: 79 mg/dL (ref 70–99)
Glucose-Capillary: 70 mg/dL (ref 70–99)
Glucose-Capillary: 125 mg/dL — ABNORMAL HIGH (ref 70–99)
Glucose-Capillary: 188 mg/dL — ABNORMAL HIGH (ref 70–99)
Glucose-Capillary: 127 mg/dL — ABNORMAL HIGH (ref 70–99)
Glucose-Capillary: 151 mg/dL — ABNORMAL HIGH (ref 70–99)

## 2022-08-28 LAB — IRON AND TIBC
Iron: 22 ug/dL — ABNORMAL LOW (ref 45–182)
Saturation Ratios: 10 % — ABNORMAL LOW (ref 17.9–39.5)
TIBC: 213 ug/dL — ABNORMAL LOW (ref 250–450)
UIBC: 191 ug/dL

## 2022-08-28 LAB — CBC
HCT: 20.4 % — ABNORMAL LOW (ref 39.0–52.0)
Hemoglobin: 7.1 g/dL — ABNORMAL LOW (ref 13.0–17.0)
MCH: 34.5 pg — ABNORMAL HIGH (ref 26.0–34.0)
MCHC: 34.8 g/dL (ref 30.0–36.0)
MCV: 99 fL (ref 80.0–100.0)
Platelets: 146 10*3/uL — ABNORMAL LOW (ref 150–400)
RBC: 2.06 MIL/uL — ABNORMAL LOW (ref 4.22–5.81)
RDW: 15.7 % — ABNORMAL HIGH (ref 11.5–15.5)
WBC: 11.1 10*3/uL — ABNORMAL HIGH (ref 4.0–10.5)
nRBC: 0 % (ref 0.0–0.2)

## 2022-08-28 LAB — FERRITIN: Ferritin: 371 ng/mL — ABNORMAL HIGH (ref 24–336)

## 2022-08-28 LAB — HEMOGLOBIN AND HEMATOCRIT, BLOOD
HCT: 22.4 % — ABNORMAL LOW (ref 39.0–52.0)
Hemoglobin: 7.8 g/dL — ABNORMAL LOW (ref 13.0–17.0)

## 2022-08-28 LAB — HEPATITIS B SURFACE ANTIBODY, QUANTITATIVE: Hep B S AB Quant (Post): 155.4 m[IU]/mL (ref >9.9)

## 2022-08-28 LAB — PREPARE RBC (CROSSMATCH)

## 2022-08-28 MED ORDER — SODIUM CHLORIDE 0.9% IV SOLUTION: Freq: Once | INTRAVENOUS | Status: AC

## 2022-08-28 MED ORDER — DARBEPOETIN ALFA 100 MCG/0.5ML IJ SOSY
100.0000 ug | PREFILLED_SYRINGE | INTRAMUSCULAR | Status: DC
  Administered 2022-08-29: 100 ug via SUBCUTANEOUS
  Filled 2022-08-28: qty 0.5

## 2022-08-28 NOTE — Progress Notes (Signed)
Pt lab drawn CBG is 65.  Pt given 120 of apple juice and he refused any food.

## 2022-08-28 NOTE — Progress Notes (Signed)
Orthopaedics Daily Progress Note   08/28/2022   9:40 AM  [name] is a 81 y.o. male 2 Days Post-Op s/p INTRAMEDULLARY (IM) NAIL INTERTROCHANTERIC  Subjective Pain well controlled.  Worked with PT, rec for SNF.  Objective Vitals:   08/28/22 0610 08/28/22 0812  BP: 110/80 (!) 132/43  Pulse:  69  Resp: 16   Temp: 98.9 F (37.2 C) 98.3 F (36.8 C)  SpO2: 96% 90%    Intake/Output Summary (Last 24 hours) at 08/28/2022 0940 Last data filed at 08/28/2022 0600 Gross per 24 hour  Intake 290 ml  Output 200 ml  Net 90 ml     Physical Exam LLE: Dressing clean, dry, and intact +DF/PF/EHL SILT SP/DP/T +DP/PT and WWP distally  Assessment 81 y.o. male s/p Procedure(s) (LRB): INTRAMEDULLARY (IM) NAIL INTERTROCHANTERIC (Left)  Plan Mobility: Out of bed with PT/OT Pain control: Continue to wean/titrate to appropriate oral regimen DVT Prophylaxis: Defer to primary medical team for DVT prophylaxis given CKD. Recommend continuing pharmacologic prophylaxis for 6 weeks postoperatively.  Further surgical plans: None LLE: Weightbearing as tolerated, no restrictions Dressing care: Keep AQUACEL on and dry for up to 14 days.  Do not allow surgical area to get wet before that.  Remove AQUACEL dressing after 14 days and allow area to get wet in shower but DO NOT SUBMERGE until wound is evaluated in clinic.  In most cases skin glue is used and no additional dressing is necessary.  Disposition: Per primary team as medically appropriate Follow-up: Please call [facility] [phone]) to schedule follow-op appointment for 2 weeks after surgery.  I have verified that my discharge instructions and follow-up information have been entered in the Discharge Navigator in Epic.  These should automatically populate in the AVS.  Please print the AVS in its entirety and ensure that the patient or a responsible party has a complete copy of the AVS before they are discharged.   If there are questions regarding discharge instructions or follow-up before the AVS is generated, please check the Discharge Navigator before attempting to contact the surgeon/office.  If unsure how to access the Discharge Navigator or the information contained in the Discharge Navigator, or how to generate/print the AVS, please contact the appropriate Nurse, learning disability.   The patient's postoperative prescriptions (e.g. pain medication, anticoagulation) have been printed, signed, and placed in/on the patient's hard chart: Norco  Please note that patient will require pharmacologic VTE prophylaxis for 6 weeks postoperatively.  An additional script will be required for this medication.  Orthopaedics will defer choice of agent to primary medical team in setting of CKD and medical comorbidities.    Georgeanna Harrison M.D. Orthopaedic Surgery [facility]

## 2022-08-28 NOTE — Progress Notes (Signed)
PROGRESS NOTE    [name]  VQQ:[id number] DOB: [birth year] DOA: 08/25/2022 PCP: Johnny Showers, MD   Chief Complaint  Patient presents with   Fall   Lt hip pain    Brief Narrative:    [name] is a 81 y.o. male with medical history significant for Alzheimer's dementia, ESRD on HD TTS, hypertension, type 2 diabetes, coronary artery disease, peripheral vascular disease, who presented to [facility] ED after a fall at home.  He was walking to the bathroom when he fell and landed on his left hip.  He could not bear weight on his left hip after the fall.  Significant for left hip fracture, status postsurgical repair by Dr. Mable Fill 12/3.    Assessment & Plan:   Principal Problem:   S/p left hip fracture Active Problems:   Mixed diabetic hyperlipidemia associated with type 2 diabetes mellitus (HCC)   Non-insulin dependent type 2 diabetes mellitus ([place])   Hyperlipidemia   Essential hypertension   ESRD on dialysis [facility])   Dementia without behavioral disturbance ([place])  Fall status post left hip fracture, POA -S/p surgical repair by Dr. Mable Fill 12/3 -as needed pain medication -  PT/OT consulted, recommendation for SNF.    ESRD on HD TTS -Nephrology consulted.  Continue hemodialysis on Monday Wednesday Friday schedule  Hyperkalemia Serum potassium 5.3 on admission, received Lokelma, management with HD  Hypertension BPs are currently soft, actually he is on midodrine.   Hypothyroidism Resume home levothyroxine   Dementia/mood disorder Resume home regimen. Reorient as needed Fall precautions Delirium precautions   Diabetes mellitus -Will hold Prandin and Tradjenta and keep an insulin sliding scale during hospital stay.  Acute blood loss anemia Anemia of chronic kidney disease -Globin dropped to 7.1 today, will transfuse 1 unit PRBC, I have discussed with wife, she consented for transfusion. -Aranesp  per renal if indicated.   DVT prophylaxis: Mims heparin Code Status:  Full code Family Communication: None at bedside, D/W by phone, she did to PRBC transfusion Disposition:   Status is: Inpatient    Consultants:  Orthopedic Renal   Subjective:  reports his pain is controlled today, does not recall if he had any bowel movement yet.  Objective: Vitals:   08/27/22 2134 08/28/22 0025 08/28/22 0610 08/28/22 0812  BP: (!) 104/42 91/70 110/80 (!) 132/43  Pulse:    69  Resp:  16 16   Temp: 99.5 F (37.5 C) 98.7 F (37.1 C) 98.9 F (37.2 C) 98.3 F (36.8 C)  TempSrc: Axillary Oral Oral Oral  SpO2:  100% 96% 90%  Weight:      Height:        Intake/Output Summary (Last 24 hours) at 08/28/2022 1102 Last data filed at 08/28/2022 0600 Gross per 24 hour  Intake 170 ml  Output 200 ml  Net -30 ml   Filed Weights   08/26/22 1929 08/27/22 1455  Weight: 70.4 kg 70.1 kg    Examination:  Awake Alert, confused, no apparent distress Symmetrical Chest wall movement, Good air movement bilaterally, CTAB RRR,No Gallops,Rubs or new Murmurs, No Parasternal Heave +ve B.Sounds, Abd Soft, No tenderness, No rebound - guarding or rigidity. No Cyanosis, Clubbing or edema, No new Rash or bruise      Data Reviewed: I have personally reviewed following labs and imaging studies  CBC: Recent Labs  Lab 08/25/22 1935 08/26/22 0455 08/27/22 1220 08/28/22 0426  WBC 8.2 8.4 10.8* 11.1*  HGB 11.3* 10.2* 8.4* 7.1*  HCT 34.0* 29.7* 25.0*  20.4*  MCV 105.6* 100.0 102.5* 99.0  PLT 146* 144* 147* 146*    Basic Metabolic Panel: Recent Labs  Lab 08/25/22 1935 08/26/22 0455 08/27/22 1219 08/27/22 1220 08/28/22 0426  NA 134* 134*  --  134* 137  K 5.3* 5.1  --  6.1* 4.3  CL 96* 92*  --  94* 96*  CO2 24 28  --  26 27  GLUCOSE 86 99  --  116* 65*  BUN 28* 34*  --  51* 25*  CREATININE 5.54* 6.10*  --  8.40* 4.85*  CALCIUM 10.0 9.9  --  9.3 9.4  MG  --  2.3  --   --   --   PHOS  --  5.6* 7.8* 7.9* 5.2*    GFR: Estimated Creatinine Clearance: 11.6 mL/min  (A) (by C-G formula based on SCr of 4.85 mg/dL (H)).  Liver Function Tests: Recent Labs  Lab 08/26/22 0455 08/27/22 1220 08/28/22 0426  ALBUMIN 2.8* 2.7* 2.4*    CBG: Recent Labs  Lab 08/26/22 2007 08/27/22 1014 08/27/22 1324 08/28/22 0555 08/28/22 0930  GLUCAP 125* 102* 106* 79 70     Recent Results (from the past 240 hour(s))  Surgical pcr screen     Status: None   Collection Time: 08/26/22  9:56 AM   Specimen: Nasal Mucosa; Nasal Swab  Result Value Ref Range Status   MRSA, PCR NEGATIVE NEGATIVE Final   Staphylococcus aureus NEGATIVE NEGATIVE Final    Comment: (NOTE) The Xpert SA Assay (FDA approved for NASAL specimens in patients 35 years of age and older), is one component of a comprehensive surveillance program. It is not intended to diagnose infection nor to guide or monitor treatment. Performed at [facility], [place] [address], [place], [place] [address]          Radiology Studies: DG HIP UNILAT WITH PELVIS 2-3 VIEWS LEFT  Result Date: 08/26/2022 CLINICAL DATA:  Status post internal fixation of fracture of left femur EXAM: DG HIP (WITH OR WITHOUT PELVIS) 2-3V LEFT COMPARISON:  08/25/2022 FINDINGS: There is interval internal fixation of intertrochanteric fracture of left femur with intramedullary rod and surgical screw. There is previous internal fixation in the proximal right femur. Extensive arterial calcifications are seen. There are surgical clips in both inguinal regions, possibly suggesting previous vascular intervention. There is evidence of vertebroplasty in lower lumbar spine. IMPRESSION: Interval internal fixation of intertrochanteric fracture of left femur. Severe arteriosclerosis. Electronically Signed   By: Elmer Picker M.D.   On: 08/26/2022 13:35   DG FEMUR MIN 2 VIEWS LEFT  Result Date: 08/26/2022 CLINICAL DATA:  Left hip arthroplasty Fluoroscopy time: 65.6 seconds. Images: 6 Air kerma: 11.64 mGy EXAM: LEFT FEMUR 2 VIEWS COMPARISON:   August 25, 2022 FINDINGS: Images were obtained during repair of the left hip fracture. A gamma nail and intramedullary rod are identified by the end of the study with a distal interlocking screw. IMPRESSION: Left hip arthroplasty as above. Electronically Signed   By: Dorise Bullion III M.D.   On: 08/26/2022 11:55   DG C-Arm 1-60 Min-No Report  Result Date: 08/26/2022 Fluoroscopy was utilized by the requesting physician.  No radiographic interpretation.        Scheduled Meds:  sodium chloride   Intravenous Once   allopurinol  100 mg Oral Daily   Chlorhexidine Gluconate Cloth  6 each Topical Q0600   [START ON 08/29/2022] darbepoetin (ARANESP) injection - DIALYSIS  100 mcg Subcutaneous Q Wed-1800   docusate sodium  100 mg Oral BID   donepezil  10 mg Oral QHS   doxercalciferol  5 mcg Intravenous Q M,W,F-HD   heparin  5,000 Units Subcutaneous Q8H   insulin aspart  0-6 Units Subcutaneous TID WC   lacosamide  100 mg Oral BID   levothyroxine  50 mcg Oral Daily   midodrine  10 mg Oral Q M,W,F   pantoprazole  40 mg Oral Daily   polyethylene glycol  17 g Oral BID   QUEtiapine  25 mg Oral QHS   rosuvastatin  10 mg Oral Daily   senna-docusate  1 tablet Oral QHS   Continuous Infusions:   ceFAZolin (ANCEF) IV 1 g (08/26/22 1738)     LOS: 3 days        Johnny Climes, MD [facility]   To contact the attending provider between 7A-7P or the covering provider during after hours 7P-7A, please log into the web site [email or web address] and access using universal [place] password for that web site. If you do not have the password, please call the hospital operator.  08/28/2022, 11:02 AM

## 2022-08-28 NOTE — Progress Notes (Addendum)
[facility] Progress Note   Subjective:   no complaints - not oriented currently.  In good spirits.  UF 0.2L with HD yesterday.   Objective Vitals:   08/27/22 2134 08/28/22 0025 08/28/22 0610 08/28/22 0812  BP: (!) 104/42 91/70 110/80 (!) 132/43  Pulse:    69  Resp:  16 16   Temp: 99.5 F (37.5 C) 98.7 F (37.1 C) 98.9 F (37.2 C) 98.3 F (36.8 C)  TempSrc: Axillary Oral Oral Oral  SpO2:  100% 96% 90%  Weight:      Height:       Physical Exam General: lying flat in bed comfortable Heart: RRR Lungs: clear Abdomen: soft Extremities: no edema, L hip incision c/d/I - no bruising or oozing noted Dialysis Access:  RUE AVF +t/b  Additional Objective Labs: Basic Metabolic Panel: Recent Labs  Lab 08/26/22 0455 08/27/22 1219 08/27/22 1220 08/28/22 0426  NA 134*  --  134* 137  K 5.1  --  6.1* 4.3  CL 92*  --  94* 96*  CO2 28  --  26 27  GLUCOSE 99  --  116* 65*  BUN 34*  --  51* 25*  CREATININE 6.10*  --  8.40* 4.85*  CALCIUM 9.9  --  9.3 9.4  PHOS 5.6* 7.8* 7.9* 5.2*    Liver Function Tests: Recent Labs  Lab 08/26/22 0455 08/27/22 1220 08/28/22 0426  ALBUMIN 2.8* 2.7* 2.4*    No results for input(s): "LIPASE", "AMYLASE" in the last 168 hours. CBC: Recent Labs  Lab 08/25/22 1935 08/26/22 0455 08/27/22 1220 08/28/22 0426  WBC 8.2 8.4 10.8* 11.1*  HGB 11.3* 10.2* 8.4* 7.1*  HCT 34.0* 29.7* 25.0* 20.4*  MCV 105.6* 100.0 102.5* 99.0  PLT 146* 144* 147* 146*    Blood Culture    Component Value Date/Time   SDES URINE, CLEAN CATCH 04/09/2022 2051   SPECREQUEST  04/09/2022 2051    NONE Performed at [facility] [address] [place], [place] [address]    CULT MULTIPLE SPECIES PRESENT, SUGGEST RECOLLECTION (A) 04/09/2022 2051   REPTSTATUS 04/11/2022 FINAL 04/09/2022 2051    Cardiac Enzymes: No results for input(s): "CKTOTAL", "CKMB", "CKMBINDEX", "TROPONINI" in the last 168 hours. CBG: Recent Labs  Lab 08/26/22 1731  08/26/22 2007 08/27/22 1014 08/27/22 1324 08/28/22 0555  GLUCAP 174* 125* 102* 106* 79    Iron Studies: No results for input(s): "IRON", "TIBC", "TRANSFERRIN", "FERRITIN" in the last 72 hours. '@lablastinr3'$ @ Studies/Results: DG HIP UNILAT WITH PELVIS 2-3 VIEWS LEFT  Result Date: 08/26/2022 CLINICAL DATA:  Status post internal fixation of fracture of left femur EXAM: DG HIP (WITH OR WITHOUT PELVIS) 2-3V LEFT COMPARISON:  08/25/2022 FINDINGS: There is interval internal fixation of intertrochanteric fracture of left femur with intramedullary rod and surgical screw. There is previous internal fixation in the proximal right femur. Extensive arterial calcifications are seen. There are surgical clips in both inguinal regions, possibly suggesting previous vascular intervention. There is evidence of vertebroplasty in lower lumbar spine. IMPRESSION: Interval internal fixation of intertrochanteric fracture of left femur. Severe arteriosclerosis. Electronically Signed   By: Elmer Picker M.D.   On: 08/26/2022 13:35   DG FEMUR MIN 2 VIEWS LEFT  Result Date: 08/26/2022 CLINICAL DATA:  Left hip arthroplasty Fluoroscopy time: 65.6 seconds. Images: 6 Air kerma: 11.64 mGy EXAM: LEFT FEMUR 2 VIEWS COMPARISON:  August 25, 2022 FINDINGS: Images were obtained during repair of the left hip fracture. A gamma nail and intramedullary rod are identified  by the end of the study with a distal interlocking screw. IMPRESSION: Left hip arthroplasty as above. Electronically Signed   By: Dorise Bullion III M.D.   On: 08/26/2022 11:55   DG C-Arm 1-60 Min-No Report  Result Date: 08/26/2022 Fluoroscopy was utilized by the requesting physician.  No radiographic interpretation.   Medications:   ceFAZolin (ANCEF) IV 1 g (08/26/22 1738)    allopurinol  100 mg Oral Daily   Chlorhexidine Gluconate Cloth  6 each Topical Q0600   docusate sodium  100 mg Oral BID   donepezil  10 mg Oral QHS   doxercalciferol  5 mcg Intravenous  Q M,W,F-HD   heparin  5,000 Units Subcutaneous Q8H   insulin aspart  0-6 Units Subcutaneous TID WC   lacosamide  100 mg Oral BID   levothyroxine  50 mcg Oral Daily   midodrine  10 mg Oral Q M,W,F   pantoprazole  40 mg Oral Daily   polyethylene glycol  17 g Oral BID   QUEtiapine  25 mg Oral QHS   rosuvastatin  10 mg Oral Daily   senna-docusate  1 tablet Oral QHS     [facility]: [place] [facility]  3h 63mn  400/ 1.5  66kg  2/2 bath  Hep none  RUA AVF - last HD 12/1, post wt 66.7kg - doxercalciferol 8 ug IV mwf - no esa, last Hb 11.7     Assessment/ Plan: L hip fracture - sp fall. Orthopedic surg took to OR 12/3 for ORIF.  ESRD - on HD MWF. Has not missed HD. HD next tomorrow.  Hyperkalemia - resolved with dialysis  Dementia/ mood disorder - on medications BP/ volume - takes midodrine pre HD for low BP's. Not on any BP lowering meds. 4kg up by wts.  Anemia esrd - Hb has dropped significantly post op = was 10-11 now 8.4 > 7.1. EBL 1557m  No reported bleeding, incisions/area look ok.  Will check iron panel and start ESA as well.  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 Phos 5.6.   LiJannifer HickD 08/28/2022, 8:59 AM  [facility] Pager: [phone]

## 2022-08-28 NOTE — TOC Progression Note (Signed)
Transition of Care [facility]) - Progression Note    Patient Details  Name: [name] MRN: [id number] Date of Birth: 06/23/1941  Transition of [facility]) CM/SW Contact  Joanne Chars, LCSW Phone Number: 08/28/2022, 3:54 PM  Clinical Narrative:   Bed offers provided to wife [name] in room, she asked for response from [facility], contacted [facility].  [facility] cannot offer a bed, wife updated.  Will make choice by tomorrow.     Expected Discharge Plan: [place] Barriers to Discharge: Continued Medical Work up, SNF Pending bed offer  Expected Discharge Plan and Services Expected Discharge Plan: [place] In-house Referral: Clinical Social Work   Post Acute Care Choice: [place] Living arrangements for the past 2 months: [place] Determinants of Health (SDOH) Interventions    Readmission Risk Interventions    06/22/2022    1:15 PM 06/15/2022    3:42 PM 06/10/2022    2:43 PM  Readmission Risk Prevention Plan  Transportation Screening Complete Complete Complete  Medication Review [employer]) Complete Complete Complete  PCP or Specialist appointment within 3-5 days of discharge Complete Complete Complete  HRI or Home Care Consult Complete Complete Complete  SW Recovery Care/Counseling Consult Complete Complete Complete  Palliative Care Screening Not Applicable Not Applicable Not [place] Not Applicable Not Applicable Not Applicable

## 2022-08-29 DIAGNOSIS — Z8781 Personal history of (healed) traumatic fracture: Secondary | ICD-10-CM | POA: Diagnosis not present

## 2022-08-29 LAB — CBC
HCT: 21.7 % — ABNORMAL LOW (ref 39.0–52.0)
Hemoglobin: 7.6 g/dL — ABNORMAL LOW (ref 13.0–17.0)
MCH: 33.8 pg (ref 26.0–34.0)
MCHC: 35 g/dL (ref 30.0–36.0)
MCV: 96.4 fL (ref 80.0–100.0)
Platelets: 139 10*3/uL — ABNORMAL LOW (ref 150–400)
RBC: 2.25 MIL/uL — ABNORMAL LOW (ref 4.22–5.81)
RDW: 16.1 % — ABNORMAL HIGH (ref 11.5–15.5)
WBC: 8.1 10*3/uL (ref 4.0–10.5)
nRBC: 0.2 % (ref 0.0–0.2)

## 2022-08-29 LAB — RENAL FUNCTION PANEL
Albumin: 2.3 g/dL — ABNORMAL LOW (ref 3.5–5.0)
Anion gap: 16 — ABNORMAL HIGH (ref 5–15)
BUN: 37 mg/dL — ABNORMAL HIGH (ref 8–23)
CO2: 30 mmol/L (ref 22–32)
Calcium: 10.2 mg/dL (ref 8.9–10.3)
Chloride: 94 mmol/L — ABNORMAL LOW (ref 98–111)
Creatinine, Ser: 6.76 mg/dL — ABNORMAL HIGH (ref 0.61–1.24)
GFR, Estimated: 8 mL/min — ABNORMAL LOW (ref >60)
Glucose, Bld: 98 mg/dL (ref 70–99)
Phosphorus: 5.6 mg/dL — ABNORMAL HIGH (ref 2.5–4.6)
Potassium: 4.4 mmol/L (ref 3.5–5.1)
Sodium: 140 mmol/L (ref 135–145)

## 2022-08-29 LAB — BPAM RBC
Blood Product Expiration Date: 202312272359
ISSUE DATE / TIME: 202312051218
Unit Type and Rh: 5100

## 2022-08-29 LAB — TYPE AND SCREEN
ABO/RH(D): O POS
Antibody Screen: NEGATIVE
Unit division: 0

## 2022-08-29 LAB — GLUCOSE, CAPILLARY
Glucose-Capillary: 90 mg/dL (ref 70–99)
Glucose-Capillary: 109 mg/dL — ABNORMAL HIGH (ref 70–99)
Glucose-Capillary: 110 mg/dL — ABNORMAL HIGH (ref 70–99)

## 2022-08-29 MED ORDER — MEMANTINE HCL 10 MG PO TABS
10.0000 mg | ORAL_TABLET | Freq: Two times a day (BID) | ORAL | Status: DC
  Administered 2022-08-29 – 2022-08-30 (×3): 10 mg via ORAL
  Filled 2022-08-29 (×3): qty 1

## 2022-08-29 MED ORDER — ROSUVASTATIN CALCIUM 20 MG PO TABS: 20.0000 mg | ORAL_TABLET | Freq: Every day | ORAL | Status: DC

## 2022-08-29 MED ORDER — SODIUM CHLORIDE 0.9 % IV SOLN
125.0000 mg | INTRAVENOUS | Status: DC
  Administered 2022-08-29: 125 mg via INTRAVENOUS
  Filled 2022-08-29: qty 10

## 2022-08-29 NOTE — Care Management Important Message (Signed)
Important Message  Patient Details  Name: [name] MRN: [id number] Date of Birth: 09-Aug-1941   Medicare Important Message Given:  Yes     Hannah Beat 08/29/2022, 8:08 AM

## 2022-08-29 NOTE — Progress Notes (Signed)
[facility] Progress Note   Subjective:   no complaints - not oriented currently.  In good spirits.  For HD today.  D/c to SNF soon.   Objective Vitals:   08/28/22 2106 08/29/22 0429 08/29/22 0500 08/29/22 0937  BP: 127/65 (!) 112/55  (!) 157/88  Pulse: 80 73  82  Resp: 16   17  Temp: 98.7 F (37.1 C) 98.6 F (37 C)  98 F (36.7 C)  TempSrc: Oral Oral  Oral  SpO2: 96% 96%  99%  Weight:   70.4 kg   Height:       Physical Exam General: lying flat in bed comfortable Heart: RRR Lungs: clear Abdomen: soft Extremities: no edema, L hip incision c/d/I - no bruising or oozing noted Dialysis Access:  RUE AVF +t/b, old nonfunctional LUE AVF noted  Additional Objective Labs: Basic Metabolic Panel: Recent Labs  Lab 08/27/22 1220 08/28/22 0426 08/29/22 0329  NA 134* 137 140  K 6.1* 4.3 4.4  CL 94* 96* 94*  CO2 '26 27 30  '$ GLUCOSE 116* 65* 98  BUN 51* 25* 37*  CREATININE 8.40* 4.85* 6.76*  CALCIUM 9.3 9.4 10.2  PHOS 7.9* 5.2* 5.6*    Liver Function Tests: Recent Labs  Lab 08/27/22 1220 08/28/22 0426 08/29/22 0329  ALBUMIN 2.7* 2.4* 2.3*    No results for input(s): "LIPASE", "AMYLASE" in the last 168 hours. CBC: Recent Labs  Lab 08/25/22 1935 08/26/22 0455 08/27/22 1220 08/28/22 0426 08/28/22 1913 08/29/22 0329  WBC 8.2 8.4 10.8* 11.1*  --  8.1  HGB 11.3* 10.2* 8.4* 7.1* 7.8* 7.6*  HCT 34.0* 29.7* 25.0* 20.4* 22.4* 21.7*  MCV 105.6* 100.0 102.5* 99.0  --  96.4  PLT 146* 144* 147* 146*  --  139*    Blood Culture    Component Value Date/Time   SDES URINE, CLEAN CATCH 04/09/2022 2051   SPECREQUEST  04/09/2022 2051    NONE Performed at [facility] [address], [place],  [address]    CULT MULTIPLE SPECIES PRESENT, SUGGEST RECOLLECTION (A) 04/09/2022 2051   REPTSTATUS 04/11/2022 FINAL 04/09/2022 2051    Cardiac Enzymes: No results for input(s): "CKTOTAL", "CKMB", "CKMBINDEX", "TROPONINI" in the last 168  hours. CBG: Recent Labs  Lab 08/28/22 1159 08/28/22 1638 08/28/22 2045 08/28/22 2059 08/29/22 0814  GLUCAP 125* 188* 151* 127* 90    Iron Studies:  Recent Labs    08/28/22 0426  IRON 22*  TIBC 213*  FERRITIN 371*   '@lablastinr3'$ @ Studies/Results: No results found. Medications:    allopurinol  100 mg Oral Daily   Chlorhexidine Gluconate Cloth  6 each Topical Q0600   darbepoetin (ARANESP) injection - DIALYSIS  100 mcg Subcutaneous Q Wed-1800   docusate sodium  100 mg Oral BID   donepezil  10 mg Oral QHS   doxercalciferol  5 mcg Intravenous Q M,W,F-HD   heparin  5,000 Units Subcutaneous Q8H   insulin aspart  0-6 Units Subcutaneous TID WC   lacosamide  100 mg Oral BID   levothyroxine  50 mcg Oral Daily   memantine  10 mg Oral BID   midodrine  10 mg Oral Q M,W,F   pantoprazole  40 mg Oral Daily   QUEtiapine  25 mg Oral QHS   rosuvastatin  10 mg Oral Daily   senna-docusate  1 tablet Oral QHS     [place] HD: [place] [facility]  3h 63mn  400/ 1.5  66kg  2/2 bath  Hep none  RUA  AVF - last HD 12/1, post wt 66.7kg - doxercalciferol 8 ug IV mwf - no esa, last Hb 11.7     Assessment/ Plan: L hip fracture - sp fall. Orthopedic surg took to OR 12/3 for ORIF.  ESRD - on HD MWF. Has not missed HD. HD today Hyperkalemia - resolved with dialysis  Dementia/ mood disorder - on medications, has been calm and cooperative. BP/ volume - takes midodrine pre HD for low BP's. Not on any BP lowering meds. 4kg up by wts but likely inaccurate due to bed wt.  Will aim for UF 2-3L with HD today Anemia esrd - Hb has dropped significantly post op = was 10-11 now 8.4 > 7.1. EBL 164m.  No reported bleeding, incisions/area look ok.  Started ESA and iron sat 10% so needs IV iron = started.  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 Phos 5.6.  OK with d/c to SNF when able.     LJannifer HickMD 08/29/2022, 11:04 AM  [facility] Pager: [phone]

## 2022-08-29 NOTE — Progress Notes (Signed)
PROGRESS NOTE    [name]  ZOX:[id number] DOB: Apr 08, 1941 DOA: 08/25/2022 PCP: Elby Showers, MD   Brief Narrative:  [name] is a 81 y.o. male with medical history significant for Alzheimer's dementia, ESRD on HD TTS, hypertension, type 2 diabetes, coronary artery disease, peripheral vascular disease, who presented to [facility] ED after a fall at home.  He was walking to the bathroom when he fell and landed on his left hip.  He could not bear weight on his left hip after the fall.  Significant for left hip fracture, status postsurgical repair by Dr. Mable Fill 12/3.   Assessment & Plan:   Principal Problem:   S/p left hip fracture Active Problems:   Mixed diabetic hyperlipidemia associated with type 2 diabetes mellitus (HCC)   Non-insulin dependent type 2 diabetes mellitus (HCC)   Hyperlipidemia   Essential hypertension   ESRD on dialysis [facility])   Dementia without behavioral disturbance (HCC)   Paroxysmal atrial fibrillation ([place])  Fall status post left hip fracture, POA -S/p surgical repair by Dr. Mable Fill 12/3 -as needed pain medication -  PT/OT consulted, recommendation for SNF.     ESRD on HD TTS -Nephrology consulted.  Continue hemodialysis on Monday Wednesday Friday schedule   Hyperkalemia Serum potassium 5.3 on admission, received Lokelma, management with HD   Hypertension Has history of low blood pressure, BPs are currently soft, actually he is on midodrine.   Hypothyroidism Continue levothyroxine   Dementia/mood disorder Patient is only alert and oriented to self at baseline.  Continue home regimen. Reorient as needed Fall precautions Delirium precautions   Diabetes mellitus -Will continue to hold Prandin and Tradjenta and keep an insulin sliding scale during hospital stay.   Acute blood loss anemia Anemia of chronic kidney disease Dropped to 7.1 on 08/28/2022 and he received 1 unit of PRBC transfusion.  Hemoglobin over 8 today. -Aranesp  per renal if  indicated.  Paroxysmal atrial fibrillation: He also has a history of atrial fibrillation but is not on anticoagulation because of AVMs and GI bleeding.  During recent hospitalization, his atrial fibrillation was managed with amiodarone for rate control but eventually this was discontinued because of prolonged QT. He has not been on any AV nodal agents due to history of hypotension for which he is on midodrine. He has had 1 episode of atrial fibrillation on 08/27/2022 and 1 today.  He remains asymptomatic.  DVT prophylaxis: SCDs Start: 08/26/22 1504 heparin injection 5,000 Units Start: 08/25/22 2215   Code Status: Full Code  Family Communication:  None present at bedside.   Status is: Inpatient Remains inpatient appropriate because: Potential discharge tomorrow.   Estimated body mass index is 23.6 kg/m as calculated from the following:   Height as of this encounter: '5\' 8"'$  (1.727 m).   Weight as of this encounter: 70.4 kg.  Pressure Injury 11/04/21 Coccyx Mid Stage 2 -  Partial thickness loss of dermis presenting as a shallow open injury with a red, pink wound bed without slough. pink (Active)  11/04/21 2100  Location: Coccyx  Location Orientation: Mid  Staging: Stage 2 -  Partial thickness loss of dermis presenting as a shallow open injury with a red, pink wound bed without slough.  Wound Description (Comments): pink  Present on Admission: Yes   Nutritional Assessment: Body mass index is 23.6 kg/m.Marland Kitchen Seen by dietician.  I agree with the assessment and plan as outlined below: Nutrition Status:        . Skin Assessment: I  have examined the patient's skin and I agree with the wound assessment as performed by the wound care RN as outlined below: Pressure Injury 11/04/21 Coccyx Mid Stage 2 -  Partial thickness loss of dermis presenting as a shallow open injury with a red, pink wound bed without slough. pink (Active)  11/04/21 2100  Location: Coccyx  Location Orientation: Mid   Staging: Stage 2 -  Partial thickness loss of dermis presenting as a shallow open injury with a red, pink wound bed without slough.  Wound Description (Comments): pink  Present on Admission: Yes    Consultants:  Ortho and nephrology  Procedures:  As above  Antimicrobials:  Anti-infectives (From admission, onward)    Start     Dose/Rate Route Frequency Ordered Stop   08/26/22 1645  ceFAZolin (ANCEF) IVPB 1 g/50 mL premix        1 g 100 mL/hr over 30 Minutes Intravenous Every 24 hr x 2 08/26/22 1556 08/28/22 1644   08/26/22 1600  ceFAZolin (ANCEF) IVPB 2g/100 mL premix  Status:  Discontinued        2 g 200 mL/hr over 30 Minutes Intravenous Every 12 hours 08/26/22 1504 08/26/22 1556   08/26/22 1031  vancomycin (VANCOCIN) powder  Status:  Discontinued          As needed 08/26/22 1031 08/26/22 1234   08/26/22 1017  ceFAZolin (ANCEF) 2-4 GM/100ML-% IVPB       Note to Pharmacy: Grace Blight M: cabinet override      08/26/22 1017 08/26/22 1128   08/26/22 0915  ceFAZolin (ANCEF) IVPB 2g/100 mL premix        2 g 200 mL/hr over 30 Minutes Intravenous On call to O.R. 08/26/22 0906 08/26/22 1105         Subjective: Seen and examined.  No complaints.  Alert and oriented to self only.  Objective: Vitals:   08/28/22 2106 08/29/22 0429 08/29/22 0500 08/29/22 0937  BP: 127/65 (!) 112/55  (!) 157/88  Pulse: 80 73  82  Resp: 16   17  Temp: 98.7 F (37.1 C) 98.6 F (37 C)  98 F (36.7 C)  TempSrc: Oral Oral  Oral  SpO2: 96% 96%  99%  Weight:   70.4 kg   Height:        Intake/Output Summary (Last 24 hours) at 08/29/2022 1333 Last data filed at 08/28/2022 1515 Gross per 24 hour  Intake 315 ml  Output --  Net 315 ml   Filed Weights   08/26/22 1929 08/27/22 1455 08/29/22 0500  Weight: 70.4 kg 70.1 kg 70.4 kg    Examination:  General exam: Appears calm and comfortable  Respiratory system: Clear to auscultation. Respiratory effort normal. Cardiovascular system: S1 & S2  heard, RRR. No JVD, murmurs, rubs, gallops or clicks. No pedal edema. Gastrointestinal system: Abdomen is nondistended, soft and nontender. No organomegaly or masses felt. Normal bowel sounds heard. Central nervous system: Alert and oriented x 1. No focal neurological deficits. Extremities: Symmetric 5 x 5 power.  Data Reviewed: I have personally reviewed following labs and imaging studies  CBC: Recent Labs  Lab 08/25/22 1935 08/26/22 0455 08/27/22 1220 08/28/22 0426 08/28/22 1913 08/29/22 0329  WBC 8.2 8.4 10.8* 11.1*  --  8.1  HGB 11.3* 10.2* 8.4* 7.1* 7.8* 7.6*  HCT 34.0* 29.7* 25.0* 20.4* 22.4* 21.7*  MCV 105.6* 100.0 102.5* 99.0  --  96.4  PLT 146* 144* 147* 146*  --  161*   Basic Metabolic Panel: Recent  Labs  Lab 08/25/22 1935 08/26/22 0455 08/27/22 1219 08/27/22 1220 08/28/22 0426 08/29/22 0329  NA 134* 134*  --  134* 137 140  K 5.3* 5.1  --  6.1* 4.3 4.4  CL 96* 92*  --  94* 96* 94*  CO2 24 28  --  '26 27 30  '$ GLUCOSE 86 99  --  116* 65* 98  BUN 28* 34*  --  51* 25* 37*  CREATININE 5.54* 6.10*  --  8.40* 4.85* 6.76*  CALCIUM 10.0 9.9  --  9.3 9.4 10.2  MG  --  2.3  --   --   --   --   PHOS  --  5.6* 7.8* 7.9* 5.2* 5.6*   GFR: Estimated Creatinine Clearance: 8.3 mL/min (A) (by C-G formula based on SCr of 6.76 mg/dL (H)). Liver Function Tests: Recent Labs  Lab 08/26/22 0455 08/27/22 1220 08/28/22 0426 08/29/22 0329  ALBUMIN 2.8* 2.7* 2.4* 2.3*   No results for input(s): "LIPASE", "AMYLASE" in the last 168 hours. No results for input(s): "AMMONIA" in the last 168 hours. Coagulation Profile: No results for input(s): "INR", "PROTIME" in the last 168 hours. Cardiac Enzymes: No results for input(s): "CKTOTAL", "CKMB", "CKMBINDEX", "TROPONINI" in the last 168 hours. BNP (last 3 results) No results for input(s): "PROBNP" in the last 8760 hours. HbA1C: No results for input(s): "HGBA1C" in the last 72 hours. CBG: Recent Labs  Lab 08/28/22 1638  08/28/22 2045 08/28/22 2059 08/29/22 0814 08/29/22 1230  GLUCAP 188* 151* 127* 90 109*   Lipid Profile: No results for input(s): "CHOL", "HDL", "LDLCALC", "TRIG", "CHOLHDL", "LDLDIRECT" in the last 72 hours. Thyroid Function Tests: No results for input(s): "TSH", "T4TOTAL", "FREET4", "T3FREE", "THYROIDAB" in the last 72 hours. Anemia Panel: Recent Labs    08/28/22 0426  FERRITIN 371*  TIBC 213*  IRON 22*   Sepsis Labs: No results for input(s): "PROCALCITON", "LATICACIDVEN" in the last 168 hours.  Recent Results (from the past 240 hour(s))  Surgical pcr screen     Status: None   Collection Time: 08/26/22  9:56 AM   Specimen: Nasal Mucosa; Nasal Swab  Result Value Ref Range Status   MRSA, PCR NEGATIVE NEGATIVE Final   Staphylococcus aureus NEGATIVE NEGATIVE Final    Comment: (NOTE) The Xpert SA Assay (FDA approved for NASAL specimens in patients 29 years of age and older), is one component of a comprehensive surveillance program. It is not intended to diagnose infection nor to guide or monitor treatment. Performed at [facility], [place] [address] [place], [place] [address]      Radiology Studies: No results found.  Scheduled Meds:  allopurinol  100 mg Oral Daily   Chlorhexidine Gluconate Cloth  6 each Topical Q0600   darbepoetin (ARANESP) injection - DIALYSIS  100 mcg Subcutaneous Q Wed-1800   docusate sodium  100 mg Oral BID   donepezil  10 mg Oral QHS   doxercalciferol  5 mcg Intravenous Q M,W,F-HD   heparin  5,000 Units Subcutaneous Q8H   insulin aspart  0-6 Units Subcutaneous TID WC   lacosamide  100 mg Oral BID   levothyroxine  50 mcg Oral Daily   memantine  10 mg Oral BID   midodrine  10 mg Oral Q M,W,F   pantoprazole  40 mg Oral Daily   QUEtiapine  25 mg Oral QHS   rosuvastatin  10 mg Oral Daily   senna-docusate  1 tablet Oral QHS   Continuous Infusions:  ferric gluconate (FERRLECIT)  IVPB       LOS: 4 days   Darliss Cheney, MD [facility]  08/29/2022, 1:33 PM   *Please note that this is a verbal dictation therefore any spelling or grammatical errors are due to the "[place] [employer]" system interpretation.  Please page via [place] and do not message via secure chat for urgent patient care matters. Secure chat can be used for non urgent patient care matters.  How to contact the [facility] Attending or Consulting provider [place] or covering provider during after hours [place], for this patient?  Check the care team in [facility] and look for a) attending/consulting TRH provider listed and b) the [facility] team listed. Page or secure chat [phone]. Log into [email or web address] and use [place]'s universal password to access. If you do not have the password, please contact the hospital operator. Locate the [facility] provider you are looking for under [facility] and page to a number that you can be directly reached. If you still have difficulty reaching the provider, please page the [facility] (Director on Call) for the Hospitalists listed on amion for assistance.

## 2022-08-29 NOTE — TOC Progression Note (Addendum)
Transition of Care [facility]) - Progression Note    Patient Details  Name: [name] MRN: [id number] Date of Birth: 1941/02/09  Transition of Care [facility]) CM/SW Contact  Joanne Chars, LCSW Phone Number: 08/29/2022, 8:53 AM  Clinical Narrative:   TC pt wife [name], she wants to accept offer at [facility].  [facility] notified.  1100: CSW confirmed with [facility] that they can accommodate current HD schedule.  Per Perrin Smack, only semi private room available currently--CSW spoke with wife  and she acknowledges this.      Expected Discharge Plan: [place] Barriers to Discharge: Continued Medical Work up, SNF Pending bed offer  Expected Discharge Plan and Services Expected Discharge Plan: [place] In-house Referral: Clinical Social Work   Post Acute Care Choice: [place] Living arrangements for the past 2 months: [place] Determinants of Health (SDOH) Interventions    Readmission Risk Interventions    06/22/2022    1:15 PM 06/15/2022    3:42 PM 06/10/2022    2:43 PM  Readmission Risk Prevention Plan  Transportation Screening Complete Complete Complete  Medication Review Press photographer) Complete Complete Complete  PCP or Specialist appointment within 3-5 days of discharge Complete Complete Complete  HRI or Home Care Consult Complete Complete Complete  SW Recovery Care/Counseling Consult Complete Complete Complete  Palliative Care Screening Not Applicable Not Applicable Not [place] Not Applicable Not Applicable Not Applicable

## 2022-08-29 NOTE — Progress Notes (Signed)
Received patient in bed to unit.  Alert  Informed consent signed and in chart.   Treatment initiated: 1339 Treatment completed: 1658  Patient tolerated well.  Transported back to the room  Alert, without acute distress.  Hand-off given to patient's nurse.   Access used: AVG Access issues: n/a  Total UF removed: 2500 Medication(s) given: Iron    08/29/22 1658  Vitals  Temp 97.9 F (36.6 C)  BP (!) 108/54  MAP (mmHg) 69  BP Location Left Leg  BP Method Automatic  Patient Position (if appropriate) Lying  Pulse Rate 69  Pulse Rate Source Monitor  ECG Heart Rate 66  Resp 17  Oxygen Therapy  SpO2 100 %  O2 Device Room Air  During Treatment Monitoring  Intra-Hemodialysis Comments Tx completed  Post Treatment  Dialyzer Clearance Lightly streaked  Duration of HD Treatment -hour(s) 2.46 hour(s)  Liters Processed 60.6  Fluid Removed (mL) 2500 mL  Tolerated HD Treatment Yes         [facility]

## 2022-08-29 NOTE — Progress Notes (Signed)
Physical Therapy Treatment Patient Details Name: [name] MRN: [id number] DOB: [birth year] Today's Date: 08/29/2022   History of Present Illness [name] is a 81 y.o. male who presented following fall to the L side while ambulating to the bathroom. Pt was found to have a fractured hip and is now post op day 1 ORIF.  PMHx of Alzheimers, HTN, hypothyroidism, OSA, ESRD on dialysis, PVD, GERD, diabetes, CAD.    PT Comments    Pt continues to experience difficulty following directions and seems unclear as to why he is experiencing pain which makes it difficult to perform activities despite max education. Pt requires clear, concise instructions and is able to follow less than 25% of the time. Distraction allows pt to participate more fully in skilled physical therapy services for mobility, endurance and balance in sitting. Pt continues to require two person assist. Due to pt previous level of function it is believed that the pt will make progress with functional activities in a skilled setting following discharge from acute care hospital setting. Pt demonstrates no respiratory or cardiac signs/symptoms of distress throughout session.    Recommendations for follow up therapy are one component of a multi-disciplinary discharge planning process, led by the attending physician.  Recommendations may be updated based on patient status, additional functional criteria and insurance authorization.  Follow Up Recommendations  Skilled nursing-short term rehab (<3 hours/day) Can patient physically be transported by private vehicle: No   Assistance Recommended at Discharge Frequent or constant Supervision/Assistance  Patient can return home with the following Two people to help with walking and/or transfers   Equipment Recommendations  None recommended by PT    Recommendations for Other Services       Precautions / Restrictions Precautions Precautions: Fall Precaution Comments: comes to ED  after fall/seizure Restrictions Weight Bearing Restrictions: Yes LLE Weight Bearing: Weight bearing as tolerated     Mobility  Bed Mobility Overal bed mobility: Needs Assistance Bed Mobility: Supine to Sit, Sit to Supine     Supine to sit: +2 for physical assistance, Total assist Sit to supine: +2 for physical assistance, Total assist   General bed mobility comments: Pt demonstrates significant difficutly with sequencing activity and was unable to follow directions well today.    Transfers                        Ambulation/Gait                   Stairs             Wheelchair Mobility    Modified Rankin (Stroke Patients Only)       Balance Overall balance assessment: History of Falls, Needs assistance Sitting-balance support: Bilateral upper extremity supported, Feet supported, Feet unsupported, No upper extremity supported, Single extremity supported Sitting balance-Leahy Scale: Fair Sitting balance - Comments: With tactile cuing on the back pt leans posteriorly heavily, without the tactile input pt is able to lean forward and hold himself up. He leans toward the R to avoid placing pressure on the L. Pt was able to tolerate sitting longer than at his initial evaluation but continues to report pain. Pt was able to be distracted intermittently to tolerate continued activity. Postural control: Posterior lean, Right lateral lean                                  Cognition  Arousal/Alertness: Awake/alert Behavior During Therapy: Anxious, Agitated Overall Cognitive Status: History of cognitive impairments - at baseline                                 General Comments: Dementia at baseline. Pt was able to be distracted and improved sitting balance/mobiltiy tolerance        Exercises      General Comments General comments (skin integrity, edema, etc.): Pt was received wearing mitts. Nursing stated that pt no longer  needed mitts and was left in room with spouse with mitts off.      Pertinent Vitals/Pain Pain Assessment Pain Assessment: Faces Faces Pain Scale: Hurts even more Breathing: normal Negative Vocalization: repeated troubled calling out, loud moaning/groaning, crying Facial Expression: facial grimacing Body Language: tense, distressed pacing, fidgeting Consolability: distracted or reassured by voice/touch PAINAD Score: 6 Pain Location: pt states his back is bothering him, pt spouse in room and states that pt does have back pain Pain Intervention(s): RN gave pain meds during session, Monitored during session, Limited activity within patient's tolerance     PT Goals (current goals can now be found in the care plan section) Acute Rehab PT Goals PT Goal Formulation: Patient unable to participate in goal setting Time For Goal Achievement: 09/10/22 Potential to Achieve Goals: Fair Progress towards PT goals: Progressing toward goals    Frequency    Min 3X/week      PT Plan Current plan remains appropriate    Co-evaluation     PT goals addressed during session: Mobility/safety with mobility;Balance        AM-PAC PT "6 Clicks" Mobility   Outcome Measure  Help needed turning from your back to your side while in a flat bed without using bedrails?: Total Help needed moving from lying on your back to sitting on the side of a flat bed without using bedrails?: Total Help needed moving to and from a bed to a chair (including a wheelchair)?: Total Help needed standing up from a chair using your arms (e.g., wheelchair or bedside chair)?: Total Help needed to walk in hospital room?: Total Help needed climbing 3-5 steps with a railing? : Total 6 Click Score: 6    End of Session   Activity Tolerance: Treatment limited secondary to agitation;Patient limited by pain;Other (comment) (pt limited due to cognition) Patient left: in bed;with call bell/phone within reach;Other (comment);with  family/visitor present (rails up) Nurse Communication: Patient requests pain meds;Mobility status PT Visit Diagnosis: Muscle weakness (generalized) (M62.81);Repeated falls (R29.6);Other abnormalities of gait and mobility (R26.89)     Time: 9735-3299 PT Time Calculation (min) (ACUTE ONLY): 19 min  Charges:  $Therapeutic Activity: 8-22 mins                     Tomma Rakers, DPT, CLT  Acute Rehabilitation Services Office: [phone] (Secure chat preferred)    Ander Purpura 08/29/2022, 1:00 PM

## 2022-08-29 NOTE — Progress Notes (Signed)
Orthopaedics Daily Progress Note   08/29/2022   8:00 PM  [name] is a 81 y.o. male 3 Days Post-Op s/p INTRAMEDULLARY (IM) NAIL INTERTROCHANTERIC  Subjective Pain well controlled.  Worked with PT, rec for SNF.  Objective Vitals:   08/29/22 1836 08/29/22 1933  BP: (!) 121/44 (!) 126/50  Pulse: 73 78  Resp: 19 18  Temp: 98.4 F (36.9 C) 97.8 F (36.6 C)  SpO2: 96% 100%    Intake/Output Summary (Last 24 hours) at 08/29/2022 2000 Last data filed at 08/29/2022 1658 Gross per 24 hour  Intake --  Output 2500 ml  Net -2500 ml     Physical Exam NLB Resting comfortably LLE: Dressing clean, dry, and intact +DP/PT and WWP distally  Assessment 81 y.o. male s/p Procedure(s) (LRB): INTRAMEDULLARY (IM) NAIL INTERTROCHANTERIC (Left)  Plan Mobility: Out of bed with PT/OT Pain control: Continue to wean/titrate to appropriate oral regimen DVT Prophylaxis: Defer to primary medical team for DVT prophylaxis given CKD. Recommend continuing pharmacologic prophylaxis for 6 weeks postoperatively.  Further surgical plans: None LLE: Weightbearing as tolerated, no restrictions Dressing care: Keep AQUACEL on and dry for up to 14 days.  Do not allow surgical area to get wet before that.  Remove AQUACEL dressing after 14 days and allow area to get wet in shower but DO NOT SUBMERGE until wound is evaluated in clinic.  In most cases skin glue is used and no additional dressing is necessary.  Disposition: Per primary team as medically appropriate Follow-up: Please call [place] [phone]) to schedule follow-op appointment for 2 weeks after surgery.  I have verified that my discharge instructions and follow-up information have been entered in the Discharge Navigator in Epic.  These should automatically populate in the AVS.  Please print the AVS in its entirety and ensure that the patient or a responsible party has a complete copy of the AVS before they are  discharged.  If there are questions regarding discharge instructions or follow-up before the AVS is generated, please check the Discharge Navigator before attempting to contact the surgeon/office.  If unsure how to access the Discharge Navigator or the information contained in the Discharge Navigator, or how to generate/print the AVS, please contact the appropriate Nurse, learning disability.   The patient's postoperative prescriptions (e.g. pain medication, anticoagulation) have been printed, signed, and placed in/on the patient's hard chart: Norco  Please note that patient will require pharmacologic VTE prophylaxis for 6 weeks postoperatively.  An additional script will be required for this medication.  Orthopaedics will defer choice of agent to primary medical team in setting of CKD and medical comorbidities.    Georgeanna Harrison M.D. Orthopaedic Surgery [facility]

## 2022-08-30 DIAGNOSIS — N250 Renal osteodystrophy: Secondary | ICD-10-CM | POA: Diagnosis not present

## 2022-08-30 DIAGNOSIS — R109 Unspecified abdominal pain: Secondary | ICD-10-CM | POA: Diagnosis not present

## 2022-08-30 DIAGNOSIS — R2681 Unsteadiness on feet: Secondary | ICD-10-CM | POA: Diagnosis not present

## 2022-08-30 DIAGNOSIS — E785 Hyperlipidemia, unspecified: Secondary | ICD-10-CM | POA: Diagnosis not present

## 2022-08-30 DIAGNOSIS — E876 Hypokalemia: Secondary | ICD-10-CM | POA: Diagnosis not present

## 2022-08-30 DIAGNOSIS — N281 Cyst of kidney, acquired: Secondary | ICD-10-CM | POA: Diagnosis not present

## 2022-08-30 DIAGNOSIS — R262 Difficulty in walking, not elsewhere classified: Secondary | ICD-10-CM | POA: Diagnosis not present

## 2022-08-30 DIAGNOSIS — J09X1 Influenza due to identified novel influenza A virus with pneumonia: Secondary | ICD-10-CM | POA: Diagnosis not present

## 2022-08-30 DIAGNOSIS — D631 Anemia in chronic kidney disease: Secondary | ICD-10-CM | POA: Diagnosis not present

## 2022-08-30 DIAGNOSIS — J189 Pneumonia, unspecified organism: Secondary | ICD-10-CM | POA: Diagnosis not present

## 2022-08-30 DIAGNOSIS — M8580 Other specified disorders of bone density and structure, unspecified site: Secondary | ICD-10-CM | POA: Diagnosis not present

## 2022-08-30 DIAGNOSIS — I120 Hypertensive chronic kidney disease with stage 5 chronic kidney disease or end stage renal disease: Secondary | ICD-10-CM | POA: Diagnosis not present

## 2022-08-30 DIAGNOSIS — E038 Other specified hypothyroidism: Secondary | ICD-10-CM | POA: Diagnosis not present

## 2022-08-30 DIAGNOSIS — Z1152 Encounter for screening for COVID-19: Secondary | ICD-10-CM | POA: Diagnosis not present

## 2022-08-30 DIAGNOSIS — F32A Depression, unspecified: Secondary | ICD-10-CM | POA: Diagnosis not present

## 2022-08-30 DIAGNOSIS — I959 Hypotension, unspecified: Secondary | ICD-10-CM | POA: Diagnosis not present

## 2022-08-30 DIAGNOSIS — R404 Transient alteration of awareness: Secondary | ICD-10-CM | POA: Diagnosis not present

## 2022-08-30 DIAGNOSIS — E1151 Type 2 diabetes mellitus with diabetic peripheral angiopathy without gangrene: Secondary | ICD-10-CM | POA: Diagnosis not present

## 2022-08-30 DIAGNOSIS — J101 Influenza due to other identified influenza virus with other respiratory manifestations: Secondary | ICD-10-CM | POA: Diagnosis not present

## 2022-08-30 DIAGNOSIS — J1001 Influenza due to other identified influenza virus with the same other identified influenza virus pneumonia: Secondary | ICD-10-CM | POA: Diagnosis not present

## 2022-08-30 DIAGNOSIS — R911 Solitary pulmonary nodule: Secondary | ICD-10-CM | POA: Diagnosis not present

## 2022-08-30 DIAGNOSIS — Z9842 Cataract extraction status, left eye: Secondary | ICD-10-CM | POA: Diagnosis not present

## 2022-08-30 DIAGNOSIS — Z87891 Personal history of nicotine dependence: Secondary | ICD-10-CM | POA: Diagnosis not present

## 2022-08-30 DIAGNOSIS — R531 Weakness: Secondary | ICD-10-CM | POA: Diagnosis not present

## 2022-08-30 DIAGNOSIS — J1089 Influenza due to other identified influenza virus with other manifestations: Secondary | ICD-10-CM | POA: Diagnosis not present

## 2022-08-30 DIAGNOSIS — Z8781 Personal history of (healed) traumatic fracture: Secondary | ICD-10-CM | POA: Diagnosis not present

## 2022-08-30 DIAGNOSIS — Z66 Do not resuscitate: Secondary | ICD-10-CM | POA: Diagnosis not present

## 2022-08-30 DIAGNOSIS — T782XXA Anaphylactic shock, unspecified, initial encounter: Secondary | ICD-10-CM | POA: Diagnosis not present

## 2022-08-30 DIAGNOSIS — N186 End stage renal disease: Secondary | ICD-10-CM | POA: Diagnosis not present

## 2022-08-30 DIAGNOSIS — J09X2 Influenza due to identified novel influenza A virus with other respiratory manifestations: Secondary | ICD-10-CM | POA: Diagnosis not present

## 2022-08-30 DIAGNOSIS — I739 Peripheral vascular disease, unspecified: Secondary | ICD-10-CM | POA: Diagnosis not present

## 2022-08-30 DIAGNOSIS — N400 Enlarged prostate without lower urinary tract symptoms: Secondary | ICD-10-CM | POA: Diagnosis not present

## 2022-08-30 DIAGNOSIS — S72145D Nondisplaced intertrochanteric fracture of left femur, subsequent encounter for closed fracture with routine healing: Secondary | ICD-10-CM | POA: Diagnosis not present

## 2022-08-30 DIAGNOSIS — Z4789 Encounter for other orthopedic aftercare: Secondary | ICD-10-CM | POA: Diagnosis not present

## 2022-08-30 DIAGNOSIS — I480 Paroxysmal atrial fibrillation: Secondary | ICD-10-CM | POA: Diagnosis not present

## 2022-08-30 DIAGNOSIS — Z79890 Hormone replacement therapy (postmenopausal): Secondary | ICD-10-CM | POA: Diagnosis not present

## 2022-08-30 DIAGNOSIS — S72145S Nondisplaced intertrochanteric fracture of left femur, sequela: Secondary | ICD-10-CM | POA: Diagnosis not present

## 2022-08-30 DIAGNOSIS — R0602 Shortness of breath: Secondary | ICD-10-CM | POA: Diagnosis not present

## 2022-08-30 DIAGNOSIS — E118 Type 2 diabetes mellitus with unspecified complications: Secondary | ICD-10-CM | POA: Diagnosis not present

## 2022-08-30 DIAGNOSIS — F03C0 Unspecified dementia, severe, without behavioral disturbance, psychotic disturbance, mood disturbance, and anxiety: Secondary | ICD-10-CM | POA: Diagnosis not present

## 2022-08-30 DIAGNOSIS — R41841 Cognitive communication deficit: Secondary | ICD-10-CM | POA: Diagnosis not present

## 2022-08-30 DIAGNOSIS — J9601 Acute respiratory failure with hypoxia: Secondary | ICD-10-CM | POA: Diagnosis not present

## 2022-08-30 DIAGNOSIS — Z9841 Cataract extraction status, right eye: Secondary | ICD-10-CM | POA: Diagnosis not present

## 2022-08-30 DIAGNOSIS — E1122 Type 2 diabetes mellitus with diabetic chronic kidney disease: Secondary | ICD-10-CM | POA: Diagnosis not present

## 2022-08-30 DIAGNOSIS — Z7401 Bed confinement status: Secondary | ICD-10-CM | POA: Diagnosis not present

## 2022-08-30 DIAGNOSIS — G309 Alzheimer's disease, unspecified: Secondary | ICD-10-CM | POA: Diagnosis not present

## 2022-08-30 DIAGNOSIS — I2510 Atherosclerotic heart disease of native coronary artery without angina pectoris: Secondary | ICD-10-CM | POA: Diagnosis not present

## 2022-08-30 DIAGNOSIS — Z992 Dependence on renal dialysis: Secondary | ICD-10-CM | POA: Diagnosis not present

## 2022-08-30 DIAGNOSIS — J439 Emphysema, unspecified: Secondary | ICD-10-CM | POA: Diagnosis not present

## 2022-08-30 DIAGNOSIS — Z634 Disappearance and death of family member: Secondary | ICD-10-CM | POA: Diagnosis not present

## 2022-08-30 DIAGNOSIS — E875 Hyperkalemia: Secondary | ICD-10-CM | POA: Diagnosis not present

## 2022-08-30 DIAGNOSIS — M6281 Muscle weakness (generalized): Secondary | ICD-10-CM | POA: Diagnosis not present

## 2022-08-30 DIAGNOSIS — E213 Hyperparathyroidism, unspecified: Secondary | ICD-10-CM | POA: Diagnosis not present

## 2022-08-30 DIAGNOSIS — L89152 Pressure ulcer of sacral region, stage 2: Secondary | ICD-10-CM | POA: Diagnosis not present

## 2022-08-30 DIAGNOSIS — K5730 Diverticulosis of large intestine without perforation or abscess without bleeding: Secondary | ICD-10-CM | POA: Diagnosis not present

## 2022-08-30 DIAGNOSIS — E119 Type 2 diabetes mellitus without complications: Secondary | ICD-10-CM | POA: Diagnosis not present

## 2022-08-30 DIAGNOSIS — E039 Hypothyroidism, unspecified: Secondary | ICD-10-CM | POA: Diagnosis not present

## 2022-08-30 DIAGNOSIS — S72002D Fracture of unspecified part of neck of left femur, subsequent encounter for closed fracture with routine healing: Secondary | ICD-10-CM | POA: Diagnosis not present

## 2022-08-30 DIAGNOSIS — E440 Moderate protein-calorie malnutrition: Secondary | ICD-10-CM | POA: Diagnosis not present

## 2022-08-30 DIAGNOSIS — F0390 Unspecified dementia without behavioral disturbance: Secondary | ICD-10-CM | POA: Diagnosis not present

## 2022-08-30 DIAGNOSIS — R0902 Hypoxemia: Secondary | ICD-10-CM | POA: Diagnosis not present

## 2022-08-30 DIAGNOSIS — I10 Essential (primary) hypertension: Secondary | ICD-10-CM | POA: Diagnosis not present

## 2022-08-30 DIAGNOSIS — N2581 Secondary hyperparathyroidism of renal origin: Secondary | ICD-10-CM | POA: Diagnosis not present

## 2022-08-30 DIAGNOSIS — F0280 Dementia in other diseases classified elsewhere without behavioral disturbance: Secondary | ICD-10-CM | POA: Diagnosis not present

## 2022-08-30 LAB — GLUCOSE, CAPILLARY
Glucose-Capillary: 85 mg/dL (ref 70–99)
Glucose-Capillary: 115 mg/dL — ABNORMAL HIGH (ref 70–99)

## 2022-08-30 NOTE — Progress Notes (Signed)
Orthopaedics Daily Progress Note   08/30/2022   12:48 PM  [name] is a 81 y.o. male 4 Days Post-Op s/p INTRAMEDULLARY (IM) NAIL INTERTROCHANTERIC  Subjective Pain well controlled.  Worked with PT, rec for SNF.  Objective Vitals:   08/30/22 0412 08/30/22 0749  BP: (!) 142/57 (!) 145/61  Pulse: 76 76  Resp: 16 16  Temp: 98.1 F (36.7 C) 98.3 F (36.8 C)  SpO2: 97% 100%    Intake/Output Summary (Last 24 hours) at 08/30/2022 1248 Last data filed at 08/30/2022 1030 Gross per 24 hour  Intake 50 ml  Output 2500 ml  Net -2450 ml     Physical Exam LLE: Dressing clean, dry, and intact +DF/PF/EHL SILT SP/DP/T +DP/PT and WWP distally  Assessment 81 y.o. male s/p Procedure(s) (LRB): INTRAMEDULLARY (IM) NAIL INTERTROCHANTERIC (Left)  Plan Mobility: Out of bed with PT/OT Pain control: Continue to wean/titrate to appropriate oral regimen DVT Prophylaxis: Defer to primary medical team for DVT prophylaxis given CKD. Recommend continuing pharmacologic prophylaxis for 6 weeks postoperatively.  Further surgical plans: None LLE: Weightbearing as tolerated, no restrictions Dressing care: Keep AQUACEL on and dry for up to 14 days.  Do not allow surgical area to get wet before that.  Remove AQUACEL dressing after 14 days and allow area to get wet in shower but DO NOT SUBMERGE until wound is evaluated in clinic.  In most cases skin glue is used and no additional dressing is necessary.  Disposition: Per primary team as medically appropriate Follow-up: Please call [facility] [phone]) to schedule follow-op appointment for 2 weeks after surgery.  I have verified that my discharge instructions and follow-up information have been entered in the Discharge Navigator in Epic.  These should automatically populate in the AVS.  Please print the AVS in its entirety and ensure that the patient or a responsible party has a complete copy of the AVS before they are  discharged.  If there are questions regarding discharge instructions or follow-up before the AVS is generated, please check the Discharge Navigator before attempting to contact the surgeon/office.  If unsure how to access the Discharge Navigator or the information contained in the Discharge Navigator, or how to generate/print the AVS, please contact the appropriate Nurse, learning disability.   The patient's postoperative prescriptions (e.g. pain medication, anticoagulation) have been printed, signed, and placed in/on the patient's hard chart: Norco  Please note that patient will require pharmacologic VTE prophylaxis for 6 weeks postoperatively.  An additional script will be required for this medication.  Orthopaedics will defer choice of agent to primary medical team in setting of CKD and medical comorbidities.    Georgeanna Harrison M.D. Orthopaedic Surgery [facility]

## 2022-08-30 NOTE — Discharge Summary (Signed)
Physician Discharge Summary  DELIO SLATES OFB:[id number] DOB: 1941-07-31 DOA: 08/25/2022  PCP: Elby Showers, MD  Admit date: 08/25/2022 Discharge date: 08/30/2022 30 Day Unplanned Readmission Risk Score    Flowsheet Row ED to Hosp-Admission (Current) from 08/25/2022 in [place]  30 Day Unplanned Readmission Risk Score (%) 48.69 Filed at 08/30/2022 0801       This score is the patient's risk of an unplanned readmission within 30 days of being discharged (0 -100%). The score is based on dignosis, age, lab data, medications, orders, and past utilization.   Low:  0-14.9   Medium: 15-21.9   High: 22-29.9   Extreme: 30 and above          Admitted From: Home Disposition: SNF  Recommendations for Outpatient Follow-up:  Follow up with PCP in 1-2 weeks Please obtain BMP/CBC in one week Follow-up with orthopedics in 2 weeks Please follow up with your PCP on the following pending results: Unresulted Labs (From admission, onward)    None         Home Health: None Equipment/Devices: None  Discharge Condition: Stable CODE STATUS: Full code Diet recommendation: Cardiac  Subjective: Seen and appear he has no complaints.  He is alert but not oriented due to dementia and is at his baseline.  Brief/Interim Summary: [name] is a 81 y.o. male with medical history significant for Alzheimer's dementia, ESRD on HD TTS, hypertension, type 2 diabetes, coronary artery disease, peripheral vascular disease, who presented to [facility] ED after a fall at home.  He was walking to the bathroom when he fell and landed on his left hip.  He could not bear weight on his left hip after the fall.  Significant for left hip fracture, status postsurgical repair by Dr. Mable Fill 12/3.  Pain well-controlled.  Seen by PT OT who recommends SNF.  He is being discharged in stable condition.   ESRD on HD TTS -Nephrology consulted.  Continue hemodialysis on Monday Wednesday Friday  schedule   Hypertension Has history of low blood pressure, BPs are currently soft, actually he is on midodrine.   Hypothyroidism Continue levothyroxine   Dementia/mood disorder Patient is only alert and oriented to self at baseline.  Continue home regimen.   Diabetes mellitus -Will continue to hold Prandin and Tradjenta and keep an insulin sliding scale during hospital stay.   Acute blood loss anemia Anemia of chronic kidney disease Dropped to 7.1 on 08/28/2022 and he received 1 unit of PRBC transfusion.  Hemoglobin over 8 today. -Aranesp  per renal if indicated.   Paroxysmal atrial fibrillation: He also has a history of atrial fibrillation but is not on anticoagulation because of AVMs and GI bleeding.  During recent hospitalization, his atrial fibrillation was managed with amiodarone for rate control but eventually this was discontinued because of prolonged QT. He has not been on any AV nodal agents due to history of hypotension for which he is on midodrine. He has had 1 episode of atrial fibrillation on 08/27/2022 and 1 on 08/29/2022 and he was asymptomatic.  Currently he is in sinus rhythm.  Discharge plan was discussed with patient and/or family member and they verbalized understanding and agreed with it.  Discharge Diagnoses:  Principal Problem:   S/p left hip fracture Active Problems:   Mixed diabetic hyperlipidemia associated with type 2 diabetes mellitus (HCC)   Non-insulin dependent type 2 diabetes mellitus ([place])   Hyperlipidemia   Essential hypertension   ESRD on dialysis (  [place])   Dementia without behavioral disturbance (HCC)   Paroxysmal atrial fibrillation [facility])    Discharge Instructions   Allergies as of 08/30/2022       Reactions   Ambien [zolpidem Tartrate] Other (See Comments)   Hallucinations and "felt crazy". Confusion    Penicillins Hives, Swelling, Rash   Has patient had a PCN reaction causing immediate rash, facial/tongue/throat swelling, SOB or  lightheadedness with hypotension: Yes Has patient had a PCN reaction causing severe rash involving mucus membranes or skin necrosis: Yes Has patient had a PCN reaction that required hospitalization: No Has patient had a PCN reaction occurring within the last 10 years: No If all of the above answers are "NO", then may proceed with Cephalosporin use.        Medication List     STOP taking these medications    repaglinide 0.5 MG tablet Commonly known as: PRANDIN   sevelamer carbonate 800 MG tablet Commonly known as: RENVELA       TAKE these medications    acetaminophen 325 MG tablet Commonly known as: TYLENOL Take 1-2 tablets (325-650 mg total) by mouth every 4 (four) hours as needed for mild pain. What changed:  how much to take when to take this   albuterol 108 (90 Base) MCG/ACT inhaler Commonly known as: VENTOLIN HFA Inhale 2 puffs into the lungs daily as needed for wheezing or shortness of breath.   allopurinol 100 MG tablet Commonly known as: ZYLOPRIM Take 100 mg by mouth daily.   butalbital-acetaminophen-caffeine 50-325-40 MG tablet Commonly known as: FIORICET Take 1 tablet by mouth daily as needed for headache.   camphor-menthol lotion Commonly known as: SARNA Apply 1 application  topically in the morning and at bedtime.   cetirizine 10 MG tablet Commonly known as: ZYRTEC Take 10 mg by mouth daily as needed for allergies.   cyanocobalamin 1000 MCG/ML injection Commonly known as: VITAMIN B12 Inject 1 mL (1,000 mcg total) into the muscle every 30 (thirty) days.   donepezil 10 MG tablet Commonly known as: ARICEPT Take 1 tablet (10 mg total) by mouth at bedtime.   doxercalciferol 4 MCG/2ML injection Commonly known as: HECTOROL Inject 2.5 mLs (5 mcg total) into the vein every Monday, Wednesday, and Friday with hemodialysis.   feeding supplement (NEPRO CARB STEADY) Liqd Take 237 mLs by mouth in the morning and at bedtime.   fluticasone 50 MCG/ACT nasal  spray Commonly known as: FLONASE Place 1 spray into both nostrils daily. What changed:  when to take this reasons to take this   freestyle lancets Use as instructed to check blood sugar 2 times per day dx code E11.65   FREESTYLE LITE test strip Generic drug: glucose blood USE AS DIRECTED THREE TIMES DAILY   hydrocortisone 2.5 % cream Apply 1 Application topically daily.   Lacosamide 100 MG Tabs Take 1 tablet (100 mg total) by mouth 2 (two) times daily. What changed: Another medication with the same name was removed. Continue taking this medication, and follow the directions you see here.   levothyroxine 50 MCG tablet Commonly known as: SYNTHROID Take 1 tablet (50 mcg total) by mouth daily.   memantine 10 MG tablet Commonly known as: NAMENDA Take 1 tablet (10 mg total) by mouth 2 (two) times daily.   midodrine 10 MG tablet Commonly known as: PROAMATINE Take one pill prior to hemodialysis on MWF What changed:  how much to take how to take this when to take this additional instructions   omeprazole 40  MG capsule Commonly known as: PRILOSEC Take 1 capsule (40 mg total) by mouth daily. What changed: when to take this   oxyCODONE-acetaminophen 5-325 MG tablet Commonly known as: PERCOCET/ROXICET Take 1-2 tablets by mouth every 6 (six) hours as needed for moderate pain or severe pain (POSTOPERATIVE PAIN).   QUEtiapine 25 MG tablet Commonly known as: SEROquel Take 1 tablet (25 mg total) by mouth at bedtime.   rosuvastatin 20 MG tablet Commonly known as: CRESTOR Take 20 mg by mouth at bedtime. What changed: Another medication with the same name was removed. Continue taking this medication, and follow the directions you see here.   Theratears 0.25 % Soln Generic drug: Carboxymethylcellulose Sodium Place 1 drop into both eyes as needed (for dryness).   Tradjenta 5 MG Tabs tablet Generic drug: linagliptin Take 1 tablet (5 mg total) by mouth daily.         Follow-up Information     Georgeanna Harrison, MD Follow up in 2 week(s).   Specialty: Orthopedic Surgery Contact information: [address] [place] [address] [phone]         Elby Showers, MD Follow up in 1 week(s).   Specialty: Internal Medicine Contact information: [address] [address] [phone]                Allergies  Allergen Reactions   Ambien [Zolpidem Tartrate] Other (See Comments)    Hallucinations and "felt crazy". Confusion    Penicillins Hives, Swelling and Rash    Has patient had a PCN reaction causing immediate rash, facial/tongue/throat swelling, SOB or lightheadedness with hypotension: Yes Has patient had a PCN reaction causing severe rash involving mucus membranes or skin necrosis: Yes Has patient had a PCN reaction that required hospitalization: No Has patient had a PCN reaction occurring within the last 10 years: No If all of the above answers are "NO", then may proceed with Cephalosporin use.     Consultations: Orthopedics   Procedures/Studies: DG HIP UNILAT WITH PELVIS 2-3 VIEWS LEFT  Result Date: 08/26/2022 CLINICAL DATA:  Status post internal fixation of fracture of left femur EXAM: DG HIP (WITH OR WITHOUT PELVIS) 2-3V LEFT COMPARISON:  08/25/2022 FINDINGS: There is interval internal fixation of intertrochanteric fracture of left femur with intramedullary rod and surgical screw. There is previous internal fixation in the proximal right femur. Extensive arterial calcifications are seen. There are surgical clips in both inguinal regions, possibly suggesting previous vascular intervention. There is evidence of vertebroplasty in lower lumbar spine. IMPRESSION: Interval internal fixation of intertrochanteric fracture of left femur. Severe arteriosclerosis. Electronically Signed   By: Elmer Picker M.D.   On: 08/26/2022 13:35   DG FEMUR MIN 2 VIEWS LEFT  Result Date: 08/26/2022 CLINICAL DATA:  Left hip  arthroplasty Fluoroscopy time: 65.6 seconds. Images: 6 Air kerma: 11.64 mGy EXAM: LEFT FEMUR 2 VIEWS COMPARISON:  August 25, 2022 FINDINGS: Images were obtained during repair of the left hip fracture. A gamma nail and intramedullary rod are identified by the end of the study with a distal interlocking screw. IMPRESSION: Left hip arthroplasty as above. Electronically Signed   By: Dorise Bullion III M.D.   On: 08/26/2022 11:55   DG C-Arm 1-60 Min-No Report  Result Date: 08/26/2022 Fluoroscopy was utilized by the requesting physician.  No radiographic interpretation.   CT Lumbar Spine Wo Contrast  Result Date: 08/25/2022 CLINICAL DATA:  Fall EXAM: CT THORACIC AND LUMBAR SPINE WITHOUT CONTRAST TECHNIQUE: Multidetector CT imaging of the thoracic  and lumbar spine was performed without contrast. Multiplanar CT image reconstructions were also generated. RADIATION DOSE REDUCTION: This exam was performed according to the departmental dose-optimization program which includes automated exposure control, adjustment of the mA and/or kV according to patient size and/or use of iterative reconstruction technique. COMPARISON:  CTA chest 01/12/2021 CT abdomen pelvis 08/17/2022 FINDINGS: CT THORACIC SPINE FINDINGS Alignment: Normal. Vertebrae: Height loss at T1, T3 and T8 is unchanged. No acute fracture. Paraspinal and other soft tissues: Calcific aortic atherosclerosis. Disc levels: No spinal canal stenosis. CT LUMBAR SPINE FINDINGS Segmentation: 5 lumbar type vertebrae. Alignment: Normal. Vertebrae: Unchanged vertebra plana configuration of L1 compared to 08/17/2022. Remote augmentation of L3 with cement extending into the L2-3 disc space. Height loss at the other lumbar levels is also unchanged. Paraspinal and other soft tissues: Calcific aortic atherosclerosis. Infrarenal aortic graft. 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 IMPRESSION: 1. No acute fracture or static subluxation of the thoracic or  lumbar spine. 2. Unchanged vertebra plana configuration of L1 compared to 08/17/2022. 3. Remote augmentation of L3 with cement extending into the L2-3 disc space. 4. 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 5. Chronic compression deformities of T1, T3 and T8 are unchanged since 01/12/2021. Aortic Atherosclerosis (ICD10-I70.0). Electronically Signed   By: Ulyses Jarred M.D.   On: 08/25/2022 20:16   CT Thoracic Spine Wo Contrast  Result Date: 08/25/2022 CLINICAL DATA:  Fall EXAM: CT THORACIC AND LUMBAR SPINE WITHOUT CONTRAST TECHNIQUE: Multidetector CT imaging of the thoracic and lumbar spine was performed without contrast. Multiplanar CT image reconstructions were also generated. RADIATION DOSE REDUCTION: This exam was performed according to the departmental dose-optimization program which includes automated exposure control, adjustment of the mA and/or kV according to patient size and/or use of iterative reconstruction technique. COMPARISON:  CTA chest 01/12/2021 CT abdomen pelvis 08/17/2022 FINDINGS: CT THORACIC SPINE FINDINGS Alignment: Normal. Vertebrae: Height loss at T1, T3 and T8 is unchanged. No acute fracture. Paraspinal and other soft tissues: Calcific aortic atherosclerosis. Disc levels: No spinal canal stenosis. CT LUMBAR SPINE FINDINGS Segmentation: 5 lumbar type vertebrae. Alignment: Normal. Vertebrae: Unchanged vertebra plana configuration of L1 compared to 08/17/2022. Remote augmentation of L3 with cement extending into the L2-3 disc space. Height loss at the other lumbar levels is also unchanged. Paraspinal and other soft tissues: Calcific aortic atherosclerosis. Infrarenal aortic graft. 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 IMPRESSION: 1. No acute fracture or static subluxation of the thoracic or lumbar spine. 2. Unchanged vertebra plana configuration of L1 compared to 08/17/2022. 3. Remote augmentation of L3 with cement extending into the L2-3 disc space. 4. Severe  spinal canal stenosis at L3-4 and L4-5. 5. Chronic compression deformities of T1, T3 and T8 are unchanged since 01/12/2021. Aortic Atherosclerosis (ICD10-I70.0). Electronically Signed   By: Ulyses Jarred M.D.   On: 08/25/2022 20:16   DG Hip Unilat W or Wo Pelvis 2-3 Views Left  Result Date: 08/25/2022 CLINICAL DATA:  Left hip pain. EXAM: DG HIP (WITH OR WITHOUT PELVIS) 3V LEFT COMPARISON:  Left hip radiographs 11/03/2021. FINDINGS: A minimally displaced intratrochanteric fracture is present in the proximal left femur. The hip joint is located. Extensive vascular calcifications are present. Right hip ORIF noted. Bowel gas pattern is normal. IMPRESSION: 1. Minimally displaced intratrochanteric fracture of the proximal left femur. 2. Extensive vascular calcifications. Electronically Signed   By: San Morelle M.D.   On: 08/25/2022 19:34   EEG adult  Result Date: 08/18/2022  Derek Jack, MD     08/18/2022  6:42 PM Routine EEG Report [name] is a 81 y.o. male with a history of seizure-like episode who is undergoing an EEG to evaluate for seizures. Report: This EEG was acquired with electrodes placed according to the International 10-20 electrode system (including Fp1, Fp2, F3, F4, C3, C4, P3, P4, O1, O2, T3, T4, T5, T6, A1, A2, Fz, Cz, Pz). The following electrodes were missing or displaced: none. The occipital dominant rhythm was 6-7 Hz with overriding beta frequencies. This activity is reactive to stimulation. Drowsiness was manifested by background fragmentation; deeper stages of sleep were identified by K complexes and sleep spindles. There was no focal slowing. There were no interictal epileptiform discharges. There were no electrographic seizures identified. Photic stimulation and hyperventilation were not performed. Impression and clinical correlation: This EEG was obtained while awake and asleep and is abnormal due to mild-to-moderate diffuse slowing indicative of global cerebral  dysfunction. Epileptiform abnormalities were not seen during this recording. Su Monks, MD [facility] [phone] If 7pm- 7am, please page neurology on call as listed in [place].   CT ABDOMEN PELVIS WO CONTRAST  Result Date: 08/18/2022 CLINICAL DATA:  Pain and elevated lipase EXAM: CT ABDOMEN AND PELVIS WITHOUT CONTRAST TECHNIQUE: Multidetector CT imaging of the abdomen and pelvis was performed following the standard protocol without IV contrast. RADIATION DOSE REDUCTION: This exam was performed according to the departmental dose-optimization program which includes automated exposure control, adjustment of the mA and/or kV according to patient size and/or use of iterative reconstruction technique. COMPARISON:  CT abdomen and pelvis 06/14/2022 FINDINGS: Lower chest: Emphysema. Bibasilar atelectasis/scarring. No acute abnormality. Hepatobiliary: No suspicious focal liver abnormality is seen. Subcentimeter low-density lesions are unchanged and presumably cysts. No specific follow-up imaging recommended. No gallstones, gallbladder wall thickening, or biliary dilatation. Pancreas: Unremarkable. No pancreatic ductal dilatation or surrounding inflammatory changes. Spleen: Normal in size without focal abnormality. Adrenals/Urinary Tract: Unremarkable adrenal glands. Atrophic native kidneys. Unchanged low-attenuation lesions in the kidneys are statistically likely to represent cysts. No follow-up is required. Stomach/Bowel: Stomach is within normal limits. Appendix appears normal. No evidence of bowel wall thickening, distention, or inflammatory changes. Vascular/Lymphatic: Advanced aortic atherosclerotic calcification. No abdominopelvic lymphadenopathy. Prior aorto bi-iliac stent graft. Reproductive: Unremarkable. Other: No free intraperitoneal fluid or air. Postsurgical changes anterior abdominal wall, unchanged. Musculoskeletal: No interval change. Compression fractures of all of the lumbar vertebral  bodies are similar and most marked about L1 where there is complete vertebral body height loss. Vertebroplasty L3. Right hip ORIF. IMPRESSION: No acute abnormality in the abdomen or pelvis. Multiple chronic findings detailed and findings and unchanged from 06/14/2022. Electronically Signed   By: Placido Sou M.D.   On: 08/18/2022 00:00   CT HEAD WO CONTRAST (5MM)  Result Date: 08/17/2022 CLINICAL DATA:  Twitching, possible seizure activity EXAM: CT HEAD WITHOUT CONTRAST TECHNIQUE: Contiguous axial images were obtained from the base of the skull through the vertex without intravenous contrast. RADIATION DOSE REDUCTION: This exam was performed according to the departmental dose-optimization program which includes automated exposure control, adjustment of the mA and/or kV according to patient size and/or use of iterative reconstruction technique. COMPARISON:  05/24/2022 FINDINGS: Brain: No acute hemorrhage, acute infarction or space-occupying mass lesion is noted. Chronic white matter ischemic changes and generalized atrophy are again noted. Bifrontal fluid collections are again identified stable in appearance from the prior CT examination left slightly greater than right consistent with chronic subdural hematomas. No acute component is noted.  Vascular: No hyperdense vessel or unexpected calcification. Skull: Negative for fracture or focal lesion. Prior bilateral burr holes are again noted. Sinuses/Orbits: No acute finding. Other: None. 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 No acute component is noted. Electronically Signed   By: Inez Catalina M.D.   On: 08/17/2022 23:04   DG Chest 2 View  Result Date: 08/17/2022 CLINICAL DATA:  Generalized weakness EXAM: CHEST - 2 VIEW COMPARISON:  06/21/2022 FINDINGS: Cardiac shadow is prominent but stable. Aortic calcifications are seen. The lungs are well aerated bilaterally. No focal infiltrate or effusion  noted. No bony abnormality is seen. IMPRESSION: No active cardiopulmonary disease. Electronically Signed   By: Inez Catalina M.D.   On: 08/17/2022 22:59     Discharge Exam: Vitals:   08/30/22 0412 08/30/22 0749  BP: (!) 142/57 (!) 145/61  Pulse: 76 76  Resp: 16 16  Temp: 98.1 F (36.7 C) 98.3 F (36.8 C)  SpO2: 97% 100%   Vitals:   08/29/22 1836 08/29/22 1933 08/30/22 0412 08/30/22 0749  BP: (!) 121/44 (!) 126/50 (!) 142/57 (!) 145/61  Pulse: 73 78 76 76  Resp: '19 18 16 16  '$ Temp: 98.4 F (36.9 C) 97.8 F (36.6 C) 98.1 F (36.7 C) 98.3 F (36.8 C)  TempSrc: Axillary Axillary Axillary   SpO2: 96% 100% 97% 100%  Weight:      Height:        General: Pt is alert, awake, not in acute distress Cardiovascular: RRR, S1/S2 +, no rubs, no gallops Respiratory: CTA bilaterally, no wheezing, no rhonchi Abdominal: Soft, NT, ND, bowel sounds + Extremities: no edema, no cyanosis    The results of significant diagnostics from this hospitalization (including imaging, microbiology, ancillary and laboratory) are listed below for reference.     Microbiology: Recent Results (from the past 240 hour(s))  Surgical pcr screen     Status: None   Collection Time: 08/26/22  9:56 AM   Specimen: Nasal Mucosa; Nasal Swab  Result Value Ref Range Status   MRSA, PCR NEGATIVE NEGATIVE Final   Staphylococcus aureus NEGATIVE NEGATIVE Final    Comment: (NOTE) The Xpert SA Assay (FDA approved for NASAL specimens in patients 87 years of age and older), is one component of a comprehensive surveillance program. It is not intended to diagnose infection nor to guide or monitor treatment. Performed at [facility], [place] [address], [place], [place] [address]      Labs: BNP (last 3 results) Recent Labs    06/06/22 2010 06/21/22 0038  BNP 3,250.3* 9,622.2*   Basic Metabolic Panel: Recent Labs  Lab 08/25/22 1935 08/26/22 0455 08/27/22 1219 08/27/22 1220 08/28/22 0426 08/29/22 0329  NA  134* 134*  --  134* 137 140  K 5.3* 5.1  --  6.1* 4.3 4.4  CL 96* 92*  --  94* 96* 94*  CO2 24 28  --  '26 27 30  '$ GLUCOSE 86 99  --  116* 65* 98  BUN 28* 34*  --  51* 25* 37*  CREATININE 5.54* 6.10*  --  8.40* 4.85* 6.76*  CALCIUM 10.0 9.9  --  9.3 9.4 10.2  MG  --  2.3  --   --   --   --   PHOS  --  5.6* 7.8* 7.9* 5.2* 5.6*   Liver Function Tests: Recent Labs  Lab 08/26/22 0455 08/27/22 1220 08/28/22 0426 08/29/22 0329  ALBUMIN 2.8* 2.7* 2.4* 2.3*  No results for input(s): "LIPASE", "AMYLASE" in the last 168 hours. No results for input(s): "AMMONIA" in the last 168 hours. CBC: Recent Labs  Lab 08/25/22 1935 08/26/22 0455 08/27/22 1220 08/28/22 0426 08/28/22 1913 08/29/22 0329  WBC 8.2 8.4 10.8* 11.1*  --  8.1  HGB 11.3* 10.2* 8.4* 7.1* 7.8* 7.6*  HCT 34.0* 29.7* 25.0* 20.4* 22.4* 21.7*  MCV 105.6* 100.0 102.5* 99.0  --  96.4  PLT 146* 144* 147* 146*  --  139*   Cardiac Enzymes: No results for input(s): "CKTOTAL", "CKMB", "CKMBINDEX", "TROPONINI" in the last 168 hours. BNP: Invalid input(s): "POCBNP" CBG: Recent Labs  Lab 08/28/22 2059 08/29/22 0814 08/29/22 1230 08/29/22 2049 08/30/22 0752  GLUCAP 127* 90 109* 110* 85   D-Dimer No results for input(s): "DDIMER" in the last 72 hours. Hgb A1c No results for input(s): "HGBA1C" in the last 72 hours. Lipid Profile No results for input(s): "CHOL", "HDL", "LDLCALC", "TRIG", "CHOLHDL", "LDLDIRECT" in the last 72 hours. Thyroid function studies No results for input(s): "TSH", "T4TOTAL", "T3FREE", "THYROIDAB" in the last 72 hours.  Invalid input(s): "FREET3" Anemia work up Recent Labs    08/28/22 0426  FERRITIN 371*  TIBC 213*  IRON 22*   Urinalysis    Component Value Date/Time   COLORURINE AMBER (A) 04/09/2022 2051   APPEARANCEUR CLOUDY (A) 04/09/2022 2051   LABSPEC 1.013 04/09/2022 2051   PHURINE 8.0 04/09/2022 2051   GLUCOSEU NEGATIVE 04/09/2022 2051   HGBUR SMALL (A) 04/09/2022 2051    BILIRUBINUR NEGATIVE 04/09/2022 2051   BILIRUBINUR neg 02/14/2015 1115   KETONESUR NEGATIVE 04/09/2022 2051   PROTEINUR >=300 (A) 04/09/2022 2051   UROBILINOGEN 0.2 02/20/2015 0944   NITRITE NEGATIVE 04/09/2022 2051   LEUKOCYTESUR LARGE (A) 04/09/2022 2051   Sepsis Labs Recent Labs  Lab 08/26/22 0455 08/27/22 1220 08/28/22 0426 08/29/22 0329  WBC 8.4 10.8* 11.1* 8.1   Microbiology Recent Results (from the past 240 hour(s))  Surgical pcr screen     Status: None   Collection Time: 08/26/22  9:56 AM   Specimen: Nasal Mucosa; Nasal Swab  Result Value Ref Range Status   MRSA, PCR NEGATIVE NEGATIVE Final   Staphylococcus aureus NEGATIVE NEGATIVE Final    Comment: (NOTE) The Xpert SA Assay (FDA approved for NASAL specimens in patients 70 years of age and older), is one component of a comprehensive surveillance program. It is not intended to diagnose infection nor to guide or monitor treatment. Performed at [facility], [place] [address] [place], [place] [address]      Time coordinating discharge: Over 30 minutes  SIGNED:   Darliss Cheney, MD  [facility] 08/30/2022, 9:10 AM *Please note that this is a verbal dictation therefore any spelling or grammatical errors are due to the "[employer] One" system interpretation. If 7PM-7AM, please contact night-coverage [email or web address]

## 2022-08-30 NOTE — Plan of Care (Signed)
Patient ready for discharge with no s/s of acute distress. AVS provided in discharge packet to be delivered to facility. Nurse to nurse report given to Philmore Pali, RN at [facility]. Will be transported via ambulance.

## 2022-08-30 NOTE — Progress Notes (Signed)
D/C order noted. Contacted [facility] to advise clinic of pt's d/c today and that pt should resume care tomorrow. Clinic also advised of pt's snf at d/c.   Melven Sartorius Renal Navigator [phone]

## 2022-08-30 NOTE — Progress Notes (Signed)
[facility] Progress Note   Subjective:   no complaints - not oriented currently.  In good spirits. HD with 2.5L yest - no issues.  D/c to SNF soon.   Objective Vitals:   08/29/22 1836 08/29/22 1933 08/30/22 0412 08/30/22 0749  BP: (!) 121/44 (!) 126/50 (!) 142/57 (!) 145/61  Pulse: 73 78 76 76  Resp: '19 18 16 16  '$ Temp: 98.4 F (36.9 C) 97.8 F (36.6 C) 98.1 F (36.7 C) 98.3 F (36.8 C)  TempSrc: Axillary Axillary Axillary   SpO2: 96% 100% 97% 100%  Weight:      Height:       Physical Exam General: lying flat in bed comfortable Heart: RRR Lungs: clear Abdomen: soft Extremities: no edema, L hip incision c/d/I - no bruising or oozing noted Dialysis Access:  RUE AVF +t/b, old nonfunctional LUE AVF noted  Additional Objective Labs: Basic Metabolic Panel: Recent Labs  Lab 08/27/22 1220 08/28/22 0426 08/29/22 0329  NA 134* 137 140  K 6.1* 4.3 4.4  CL 94* 96* 94*  CO2 '26 27 30  '$ GLUCOSE 116* 65* 98  BUN 51* 25* 37*  CREATININE 8.40* 4.85* 6.76*  CALCIUM 9.3 9.4 10.2  PHOS 7.9* 5.2* 5.6*    Liver Function Tests: Recent Labs  Lab 08/27/22 1220 08/28/22 0426 08/29/22 0329  ALBUMIN 2.7* 2.4* 2.3*    No results for input(s): "LIPASE", "AMYLASE" in the last 168 hours. CBC: Recent Labs  Lab 08/25/22 1935 08/26/22 0455 08/27/22 1220 08/28/22 0426 08/28/22 1913 08/29/22 0329  WBC 8.2 8.4 10.8* 11.1*  --  8.1  HGB 11.3* 10.2* 8.4* 7.1* 7.8* 7.6*  HCT 34.0* 29.7* 25.0* 20.4* 22.4* 21.7*  MCV 105.6* 100.0 102.5* 99.0  --  96.4  PLT 146* 144* 147* 146*  --  139*    Blood Culture    Component Value Date/Time   SDES URINE, CLEAN CATCH 04/09/2022 2051   SPECREQUEST  04/09/2022 2051    NONE Performed at [facility] [address] [place], [place] [address]    CULT MULTIPLE SPECIES PRESENT, SUGGEST RECOLLECTION (A) 04/09/2022 2051   REPTSTATUS 04/11/2022 FINAL 04/09/2022 2051    Cardiac Enzymes: No results for input(s): "CKTOTAL",  "CKMB", "CKMBINDEX", "TROPONINI" in the last 168 hours. CBG: Recent Labs  Lab 08/28/22 2059 08/29/22 0814 08/29/22 1230 08/29/22 2049 08/30/22 0752  GLUCAP 127* 90 109* 110* 85    Iron Studies:  Recent Labs    08/28/22 0426  IRON 22*  TIBC 213*  FERRITIN 371*    '@lablastinr3'$ @ Studies/Results: No results found. Medications:  ferric gluconate (FERRLECIT) IVPB Stopped (08/29/22 1642)     allopurinol  100 mg Oral Daily   Chlorhexidine Gluconate Cloth  6 each Topical Q0600   darbepoetin (ARANESP) injection - DIALYSIS  100 mcg Subcutaneous Q Wed-1800   docusate sodium  100 mg Oral BID   donepezil  10 mg Oral QHS   doxercalciferol  5 mcg Intravenous Q M,W,F-HD   heparin  5,000 Units Subcutaneous Q8H   insulin aspart  0-6 Units Subcutaneous TID WC   lacosamide  100 mg Oral BID   levothyroxine  50 mcg Oral Daily   memantine  10 mg Oral BID   midodrine  10 mg Oral Q M,W,F   pantoprazole  40 mg Oral Daily   QUEtiapine  25 mg Oral QHS   rosuvastatin  10 mg Oral Daily   senna-docusate  1 tablet Oral QHS     OP HD: [place]  MWF  3h 14mn  400/ 1.5  66kg  2/2 bath  Hep none  RUA AVF - last HD 12/1, post wt 66.7kg - doxercalciferol 8 ug IV mwf - no esa, last Hb 11.7     Assessment/ Plan: L hip fracture - sp fall. Orthopedic surg took to OR 12/3 for ORIF.  ESRD - on HD MWF. Has not missed HD. HD tomorrow Hyperkalemia - resolved with dialysis  Dementia/ mood disorder - on medications, has been calm and cooperative. BP/ volume - takes midodrine pre HD for low BP's. Not on any BP lowering meds. 4kg up by wts but likely inaccurate due to bed wt.  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 EBL 1564m  No reported bleeding, incisions/area look ok.  Started ESA and iron sat 10% so needs IV iron = started.  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 Phos 5.6.  OK with d/c to SNF when  able.     [name] 08/30/2022, 8:08 AM  [facility] Pager: [phone]

## 2022-08-30 NOTE — Consult Note (Signed)
   [facility] Inpatient Consult   08/30/2022  [name] [birth year] [id number]  [facility] [ACO] Patient: Medicare ACO REACH  Follow up:  Call attempt to reach wife at [phone] to follow up with post hospital care coordination needs.  Was able to leave a HIPAA appropriate voicemail message requesting a return call.  Patient noted for a skilled nursing facility level of care at transition today per inpatient [facility] team notes.  Plan:  Will alert [facility] RN PAC of patient scheduled to transition to a [facility] affiliated facility. Patient is high risk for readmission for post facility needs.  For questions,  Natividad Brood, RN BSN [place]  [phone] business mobile phone Toll free office [phone]  *[place]  [phone] Fax number: [phone] [email or web address]

## 2022-08-30 NOTE — TOC Transition Note (Signed)
Transition of Care [facility]) - CM/SW Discharge Note   Patient Details  Name: [name] MRN: [id number] Date of Birth: 14-Jun-1941  Transition of Care [facility]) CM/SW Contact:  Joanne Chars, LCSW Phone Number: 08/30/2022, 11:25 AM   Clinical Narrative:  Pt discharging to [place], room 116.  Rn call [phone] for report.      Final next level of care: Skilled Nursing Facility Barriers to Discharge: Barriers Resolved   Patient Goals and CMS Choice     Choice offered to / list presented to : Spouse (wife [name])  Discharge Placement              Patient chooses bed at:  [facility]) Patient to be transferred to facility by: [place] Name of family member notified: wife [name] Patient and family notified of of transfer: 08/30/22  Discharge Plan and Services In-house Referral: Clinical Social Work   Post Acute Care Choice: [place]                               Social Determinants of Health (SDOH) Interventions     Readmission Risk Interventions    06/22/2022    1:15 PM 06/15/2022    3:42 PM 06/10/2022    2:43 PM  Readmission Risk Prevention Plan  Transportation Screening Complete Complete Complete  Medication Review Press photographer) Complete Complete Complete  PCP or Specialist appointment within 3-5 days of discharge Complete Complete Complete  HRI or Home Care Consult Complete Complete Complete  SW Recovery Care/Counseling Consult Complete Complete Complete  Palliative Care Screening Not Applicable Not Applicable Not [place] Not Applicable Not Applicable Not Applicable

## 2022-08-31 DIAGNOSIS — E785 Hyperlipidemia, unspecified: Secondary | ICD-10-CM | POA: Diagnosis not present

## 2022-08-31 DIAGNOSIS — E039 Hypothyroidism, unspecified: Secondary | ICD-10-CM | POA: Diagnosis not present

## 2022-09-01 DIAGNOSIS — E876 Hypokalemia: Secondary | ICD-10-CM | POA: Diagnosis not present

## 2022-09-01 DIAGNOSIS — N2581 Secondary hyperparathyroidism of renal origin: Secondary | ICD-10-CM | POA: Diagnosis not present

## 2022-09-01 DIAGNOSIS — N186 End stage renal disease: Secondary | ICD-10-CM | POA: Diagnosis not present

## 2022-09-01 DIAGNOSIS — T782XXA Anaphylactic shock, unspecified, initial encounter: Secondary | ICD-10-CM | POA: Diagnosis not present

## 2022-09-01 DIAGNOSIS — Z992 Dependence on renal dialysis: Secondary | ICD-10-CM | POA: Diagnosis not present

## 2022-09-01 DIAGNOSIS — D631 Anemia in chronic kidney disease: Secondary | ICD-10-CM | POA: Diagnosis not present

## 2022-09-03 DIAGNOSIS — K5730 Diverticulosis of large intestine without perforation or abscess without bleeding: Secondary | ICD-10-CM | POA: Diagnosis not present

## 2022-09-03 DIAGNOSIS — E876 Hypokalemia: Secondary | ICD-10-CM | POA: Diagnosis not present

## 2022-09-03 DIAGNOSIS — D631 Anemia in chronic kidney disease: Secondary | ICD-10-CM | POA: Diagnosis not present

## 2022-09-03 DIAGNOSIS — Z992 Dependence on renal dialysis: Secondary | ICD-10-CM | POA: Diagnosis not present

## 2022-09-03 DIAGNOSIS — N2581 Secondary hyperparathyroidism of renal origin: Secondary | ICD-10-CM | POA: Diagnosis not present

## 2022-09-03 DIAGNOSIS — I10 Essential (primary) hypertension: Secondary | ICD-10-CM | POA: Diagnosis not present

## 2022-09-03 DIAGNOSIS — N186 End stage renal disease: Secondary | ICD-10-CM | POA: Diagnosis not present

## 2022-09-03 DIAGNOSIS — T782XXA Anaphylactic shock, unspecified, initial encounter: Secondary | ICD-10-CM | POA: Diagnosis not present

## 2022-09-03 DIAGNOSIS — F0390 Unspecified dementia without behavioral disturbance: Secondary | ICD-10-CM | POA: Diagnosis not present

## 2022-09-05 DIAGNOSIS — Z992 Dependence on renal dialysis: Secondary | ICD-10-CM | POA: Diagnosis not present

## 2022-09-05 DIAGNOSIS — Z4789 Encounter for other orthopedic aftercare: Secondary | ICD-10-CM | POA: Diagnosis not present

## 2022-09-05 DIAGNOSIS — N186 End stage renal disease: Secondary | ICD-10-CM | POA: Diagnosis not present

## 2022-09-05 DIAGNOSIS — E118 Type 2 diabetes mellitus with unspecified complications: Secondary | ICD-10-CM | POA: Diagnosis not present

## 2022-09-05 DIAGNOSIS — R41841 Cognitive communication deficit: Secondary | ICD-10-CM | POA: Diagnosis not present

## 2022-09-05 DIAGNOSIS — R2681 Unsteadiness on feet: Secondary | ICD-10-CM | POA: Diagnosis not present

## 2022-09-05 DIAGNOSIS — S72002D Fracture of unspecified part of neck of left femur, subsequent encounter for closed fracture with routine healing: Secondary | ICD-10-CM | POA: Diagnosis not present

## 2022-09-05 DIAGNOSIS — N2581 Secondary hyperparathyroidism of renal origin: Secondary | ICD-10-CM | POA: Diagnosis not present

## 2022-09-05 DIAGNOSIS — D631 Anemia in chronic kidney disease: Secondary | ICD-10-CM | POA: Diagnosis not present

## 2022-09-05 DIAGNOSIS — E039 Hypothyroidism, unspecified: Secondary | ICD-10-CM | POA: Diagnosis not present

## 2022-09-05 DIAGNOSIS — E440 Moderate protein-calorie malnutrition: Secondary | ICD-10-CM | POA: Diagnosis not present

## 2022-09-05 DIAGNOSIS — E213 Hyperparathyroidism, unspecified: Secondary | ICD-10-CM | POA: Diagnosis not present

## 2022-09-05 DIAGNOSIS — S72145D Nondisplaced intertrochanteric fracture of left femur, subsequent encounter for closed fracture with routine healing: Secondary | ICD-10-CM | POA: Diagnosis not present

## 2022-09-05 DIAGNOSIS — M6281 Muscle weakness (generalized): Secondary | ICD-10-CM | POA: Diagnosis not present

## 2022-09-05 DIAGNOSIS — N400 Enlarged prostate without lower urinary tract symptoms: Secondary | ICD-10-CM | POA: Diagnosis not present

## 2022-09-05 DIAGNOSIS — E876 Hypokalemia: Secondary | ICD-10-CM | POA: Diagnosis not present

## 2022-09-05 DIAGNOSIS — T782XXA Anaphylactic shock, unspecified, initial encounter: Secondary | ICD-10-CM | POA: Diagnosis not present

## 2022-09-06 ENCOUNTER — Other Ambulatory Visit: Payer: Self-pay | Admitting: *Deleted

## 2022-09-06 DIAGNOSIS — L89152 Pressure ulcer of sacral region, stage 2: Secondary | ICD-10-CM | POA: Diagnosis not present

## 2022-09-06 NOTE — Patient Outreach (Signed)
[facility] Post- Acute Care Coordinator follow up. [name] resides in [place] [facility]. Screening for potential [facility] care coordination services as benefit of insurance plan and PCP.   Met with [facility] social workers. [name] is from home with spouse. Has paid caregivers thru [place] benefits Monday thru Friday from 8 am to 10 am. Uses [facility] transportation for HD sessions. Family wants long term care. However, does not have any male beds available at this time. Transition plans are pending.   Will continue to follow.  Marthenia Rolling, MSN, RN,BSN [place] Acute Care Coordinator [phone] (Direct dial)

## 2022-09-07 DIAGNOSIS — N2581 Secondary hyperparathyroidism of renal origin: Secondary | ICD-10-CM | POA: Diagnosis not present

## 2022-09-07 DIAGNOSIS — N186 End stage renal disease: Secondary | ICD-10-CM | POA: Diagnosis not present

## 2022-09-07 DIAGNOSIS — D631 Anemia in chronic kidney disease: Secondary | ICD-10-CM | POA: Diagnosis not present

## 2022-09-07 DIAGNOSIS — Z992 Dependence on renal dialysis: Secondary | ICD-10-CM | POA: Diagnosis not present

## 2022-09-07 DIAGNOSIS — E876 Hypokalemia: Secondary | ICD-10-CM | POA: Diagnosis not present

## 2022-09-07 DIAGNOSIS — T782XXA Anaphylactic shock, unspecified, initial encounter: Secondary | ICD-10-CM | POA: Diagnosis not present

## 2022-09-09 ENCOUNTER — Other Ambulatory Visit: Payer: Self-pay

## 2022-09-09 ENCOUNTER — Emergency Department (HOSPITAL_COMMUNITY): Payer: Medicare Other

## 2022-09-09 ENCOUNTER — Inpatient Hospital Stay (HOSPITAL_COMMUNITY)
Admission: EM | Admit: 2022-09-09 | Discharge: 2022-09-11 | DRG: 193 | Disposition: A | Discharge status: Skilled Nursing Facility | Payer: Medicare Other | Source: Skilled Nursing Facility | Attending: Internal Medicine | Admitting: Internal Medicine

## 2022-09-09 DIAGNOSIS — M8580 Other specified disorders of bone density and structure, unspecified site: Secondary | ICD-10-CM | POA: Diagnosis present

## 2022-09-09 DIAGNOSIS — Z1152 Encounter for screening for COVID-19: Secondary | ICD-10-CM

## 2022-09-09 DIAGNOSIS — M109 Gout, unspecified: Secondary | ICD-10-CM | POA: Diagnosis present

## 2022-09-09 DIAGNOSIS — N2581 Secondary hyperparathyroidism of renal origin: Secondary | ICD-10-CM | POA: Diagnosis not present

## 2022-09-09 DIAGNOSIS — K219 Gastro-esophageal reflux disease without esophagitis: Secondary | ICD-10-CM | POA: Diagnosis present

## 2022-09-09 DIAGNOSIS — Z87891 Personal history of nicotine dependence: Secondary | ICD-10-CM

## 2022-09-09 DIAGNOSIS — Z992 Dependence on renal dialysis: Secondary | ICD-10-CM | POA: Diagnosis not present

## 2022-09-09 DIAGNOSIS — J9601 Acute respiratory failure with hypoxia: Secondary | ICD-10-CM | POA: Diagnosis present

## 2022-09-09 DIAGNOSIS — E119 Type 2 diabetes mellitus without complications: Secondary | ICD-10-CM

## 2022-09-09 DIAGNOSIS — R911 Solitary pulmonary nodule: Secondary | ICD-10-CM

## 2022-09-09 DIAGNOSIS — R9431 Abnormal electrocardiogram [ECG] [EKG]: Secondary | ICD-10-CM | POA: Diagnosis present

## 2022-09-09 DIAGNOSIS — Z888 Allergy status to other drugs, medicaments and biological substances status: Secondary | ICD-10-CM

## 2022-09-09 DIAGNOSIS — D631 Anemia in chronic kidney disease: Secondary | ICD-10-CM | POA: Diagnosis present

## 2022-09-09 DIAGNOSIS — Z9842 Cataract extraction status, left eye: Secondary | ICD-10-CM | POA: Diagnosis not present

## 2022-09-09 DIAGNOSIS — Z823 Family history of stroke: Secondary | ICD-10-CM

## 2022-09-09 DIAGNOSIS — N250 Renal osteodystrophy: Secondary | ICD-10-CM | POA: Diagnosis not present

## 2022-09-09 DIAGNOSIS — N186 End stage renal disease: Secondary | ICD-10-CM

## 2022-09-09 DIAGNOSIS — E1122 Type 2 diabetes mellitus with diabetic chronic kidney disease: Secondary | ICD-10-CM | POA: Diagnosis not present

## 2022-09-09 DIAGNOSIS — R404 Transient alteration of awareness: Secondary | ICD-10-CM | POA: Diagnosis not present

## 2022-09-09 DIAGNOSIS — R531 Weakness: Secondary | ICD-10-CM | POA: Diagnosis not present

## 2022-09-09 DIAGNOSIS — J09X1 Influenza due to identified novel influenza A virus with pneumonia: Secondary | ICD-10-CM

## 2022-09-09 DIAGNOSIS — Z66 Do not resuscitate: Secondary | ICD-10-CM | POA: Diagnosis present

## 2022-09-09 DIAGNOSIS — G4730 Sleep apnea, unspecified: Secondary | ICD-10-CM | POA: Diagnosis present

## 2022-09-09 DIAGNOSIS — E039 Hypothyroidism, unspecified: Secondary | ICD-10-CM | POA: Diagnosis not present

## 2022-09-09 DIAGNOSIS — Z9841 Cataract extraction status, right eye: Secondary | ICD-10-CM

## 2022-09-09 DIAGNOSIS — I2510 Atherosclerotic heart disease of native coronary artery without angina pectoris: Secondary | ICD-10-CM | POA: Diagnosis not present

## 2022-09-09 DIAGNOSIS — J101 Influenza due to other identified influenza virus with other respiratory manifestations: Secondary | ICD-10-CM | POA: Diagnosis not present

## 2022-09-09 DIAGNOSIS — E1151 Type 2 diabetes mellitus with diabetic peripheral angiopathy without gangrene: Secondary | ICD-10-CM | POA: Diagnosis present

## 2022-09-09 DIAGNOSIS — I10 Essential (primary) hypertension: Secondary | ICD-10-CM | POA: Diagnosis not present

## 2022-09-09 DIAGNOSIS — I120 Hypertensive chronic kidney disease with stage 5 chronic kidney disease or end stage renal disease: Secondary | ICD-10-CM | POA: Diagnosis present

## 2022-09-09 DIAGNOSIS — J1001 Influenza due to other identified influenza virus with the same other identified influenza virus pneumonia: Principal | ICD-10-CM | POA: Diagnosis present

## 2022-09-09 DIAGNOSIS — R109 Unspecified abdominal pain: Secondary | ICD-10-CM | POA: Diagnosis not present

## 2022-09-09 DIAGNOSIS — E038 Other specified hypothyroidism: Secondary | ICD-10-CM | POA: Diagnosis not present

## 2022-09-09 DIAGNOSIS — Z8249 Family history of ischemic heart disease and other diseases of the circulatory system: Secondary | ICD-10-CM

## 2022-09-09 DIAGNOSIS — G309 Alzheimer's disease, unspecified: Secondary | ICD-10-CM | POA: Diagnosis present

## 2022-09-09 DIAGNOSIS — J189 Pneumonia, unspecified organism: Secondary | ICD-10-CM

## 2022-09-09 DIAGNOSIS — F0390 Unspecified dementia without behavioral disturbance: Secondary | ICD-10-CM | POA: Diagnosis not present

## 2022-09-09 DIAGNOSIS — J439 Emphysema, unspecified: Secondary | ICD-10-CM | POA: Diagnosis present

## 2022-09-09 DIAGNOSIS — I959 Hypotension, unspecified: Secondary | ICD-10-CM | POA: Diagnosis present

## 2022-09-09 DIAGNOSIS — I480 Paroxysmal atrial fibrillation: Secondary | ICD-10-CM | POA: Diagnosis present

## 2022-09-09 DIAGNOSIS — Z79890 Hormone replacement therapy (postmenopausal): Secondary | ICD-10-CM | POA: Diagnosis not present

## 2022-09-09 DIAGNOSIS — R000 Tachycardia, unspecified: Secondary | ICD-10-CM | POA: Diagnosis not present

## 2022-09-09 DIAGNOSIS — E785 Hyperlipidemia, unspecified: Secondary | ICD-10-CM | POA: Diagnosis present

## 2022-09-09 DIAGNOSIS — Z79899 Other long term (current) drug therapy: Secondary | ICD-10-CM

## 2022-09-09 DIAGNOSIS — R7989 Other specified abnormal findings of blood chemistry: Secondary | ICD-10-CM | POA: Diagnosis present

## 2022-09-09 DIAGNOSIS — F0280 Dementia in other diseases classified elsewhere without behavioral disturbance: Secondary | ICD-10-CM | POA: Diagnosis present

## 2022-09-09 DIAGNOSIS — Z7401 Bed confinement status: Secondary | ICD-10-CM | POA: Diagnosis not present

## 2022-09-09 DIAGNOSIS — R0902 Hypoxemia: Secondary | ICD-10-CM | POA: Diagnosis not present

## 2022-09-09 DIAGNOSIS — R0602 Shortness of breath: Secondary | ICD-10-CM | POA: Diagnosis not present

## 2022-09-09 DIAGNOSIS — N281 Cyst of kidney, acquired: Secondary | ICD-10-CM | POA: Diagnosis not present

## 2022-09-09 DIAGNOSIS — Z7984 Long term (current) use of oral hypoglycemic drugs: Secondary | ICD-10-CM

## 2022-09-09 DIAGNOSIS — Z833 Family history of diabetes mellitus: Secondary | ICD-10-CM

## 2022-09-09 DIAGNOSIS — Z880 Allergy status to penicillin: Secondary | ICD-10-CM

## 2022-09-09 LAB — CBC WITH DIFFERENTIAL/PLATELET
Abs Immature Granulocytes: 0.09 10*3/uL — ABNORMAL HIGH (ref 0.00–0.07)
Basophils Absolute: 0.1 10*3/uL (ref 0.0–0.1)
Basophils Relative: 0 %
Eosinophils Absolute: 0 10*3/uL (ref 0.0–0.5)
Eosinophils Relative: 0 %
HCT: 26.3 % — ABNORMAL LOW (ref 39.0–52.0)
Hemoglobin: 8.6 g/dL — ABNORMAL LOW (ref 13.0–17.0)
Immature Granulocytes: 1 %
Lymphocytes Relative: 12 %
Lymphs Abs: 1.7 10*3/uL (ref 0.7–4.0)
MCH: 34.3 pg — ABNORMAL HIGH (ref 26.0–34.0)
MCHC: 32.7 g/dL (ref 30.0–36.0)
MCV: 104.8 fL — ABNORMAL HIGH (ref 80.0–100.0)
Monocytes Absolute: 1.1 10*3/uL — ABNORMAL HIGH (ref 0.1–1.0)
Monocytes Relative: 8 %
Neutro Abs: 10.8 10*3/uL — ABNORMAL HIGH (ref 1.7–7.7)
Neutrophils Relative %: 79 %
Platelets: 221 10*3/uL (ref 150–400)
RBC: 2.51 MIL/uL — ABNORMAL LOW (ref 4.22–5.81)
RDW: 16.6 % — ABNORMAL HIGH (ref 11.5–15.5)
WBC: 13.7 10*3/uL — ABNORMAL HIGH (ref 4.0–10.5)
nRBC: 0.2 % (ref 0.0–0.2)

## 2022-09-09 LAB — BASIC METABOLIC PANEL
Anion gap: 16 — ABNORMAL HIGH (ref 5–15)
BUN: 50 mg/dL — ABNORMAL HIGH (ref 8–23)
CO2: 28 mmol/L (ref 22–32)
Calcium: 9.7 mg/dL (ref 8.9–10.3)
Chloride: 97 mmol/L — ABNORMAL LOW (ref 98–111)
Creatinine, Ser: 8.11 mg/dL — ABNORMAL HIGH (ref 0.61–1.24)
GFR, Estimated: 6 mL/min — ABNORMAL LOW (ref >60)
Glucose, Bld: 126 mg/dL — ABNORMAL HIGH (ref 70–99)
Potassium: 3.5 mmol/L (ref 3.5–5.1)
Sodium: 141 mmol/L (ref 135–145)

## 2022-09-09 LAB — CBG MONITORING, ED: Glucose-Capillary: 115 mg/dL — ABNORMAL HIGH (ref 70–99)

## 2022-09-09 LAB — TROPONIN I (HIGH SENSITIVITY)
Troponin I (High Sensitivity): 73 ng/L — ABNORMAL HIGH (ref <18)
Troponin I (High Sensitivity): 74 ng/L — ABNORMAL HIGH (ref <18)

## 2022-09-09 LAB — RESP PANEL BY RT-PCR (RSV, FLU A&B, COVID)  RVPGX2
Influenza A by PCR: POSITIVE — AB
Influenza B by PCR: NEGATIVE
Resp Syncytial Virus by PCR: NEGATIVE
SARS Coronavirus 2 by RT PCR: NEGATIVE

## 2022-09-09 LAB — BRAIN NATRIURETIC PEPTIDE: B Natriuretic Peptide: 587.8 pg/mL — ABNORMAL HIGH (ref 0.0–100.0)

## 2022-09-09 LAB — LACTIC ACID, PLASMA: Lactic Acid, Venous: 1.1 mmol/L (ref 0.5–1.9)

## 2022-09-09 MED ORDER — IOHEXOL 350 MG/ML SOLN
75.0000 mL | Freq: Once | INTRAVENOUS | Status: AC | PRN
  Administered 2022-09-09: 75 mL via INTRAVENOUS

## 2022-09-09 MED ORDER — ACETAMINOPHEN 500 MG PO TABS
1000.0000 mg | ORAL_TABLET | Freq: Once | ORAL | Status: AC
  Administered 2022-09-09: 1000 mg via ORAL
  Filled 2022-09-09: qty 2

## 2022-09-09 MED ORDER — SODIUM CHLORIDE 0.9 % IV SOLN
500.0000 mg | Freq: Once | INTRAVENOUS | Status: AC
  Administered 2022-09-10: 500 mg via INTRAVENOUS
  Filled 2022-09-09: qty 5

## 2022-09-09 MED ORDER — SODIUM CHLORIDE 0.9 % IV SOLN
1.0000 g | Freq: Once | INTRAVENOUS | Status: AC
  Administered 2022-09-10: 1 g via INTRAVENOUS
  Filled 2022-09-09: qty 10

## 2022-09-09 MED ORDER — OSELTAMIVIR PHOSPHATE 30 MG PO CAPS
30.0000 mg | ORAL_CAPSULE | Freq: Once | ORAL | Status: AC
  Administered 2022-09-09: 30 mg via ORAL
  Filled 2022-09-09: qty 1

## 2022-09-09 NOTE — ED Provider Notes (Signed)
[facility] EMERGENCY DEPARTMENT Provider Note   CSN: [id number] Arrival date & time: 09/09/22  1845     History  Chief Complaint  Patient presents with   Shortness of Breath    [name] is a 81 y.o. male.  Pt is a 81 yo male with a pmhx significant for htn, hld, paroxysmal afib (not on thinners due to hx gib), ed, pvd, hypothyroidism, gerd, gout, dm2, cad, dementia, GI bleed, chronic anemia and ESRD on HD.  Pt also had a recent hip fx and had surgery on 12/3.  Pt presents to the ED today with sob.  Pt's SNF said he's been sob for several days.  He was 88% on RA for EMS, so he was put on 2L oxygen and O2 sat has come up to the mid-90s.  No fever.  Pt unable to contribute to hx due to dementia.  EMS said report was poor and they do not know if he's been getting dialysis appropriately.  I did call pt's wife who reports he's been getting dialysis MWF.       Home Medications Prior to Admission medications   Medication Sig Start Date End Date Taking? Authorizing Provider  acetaminophen (TYLENOL) 325 MG tablet Take 1-2 tablets (325-650 mg total) by mouth every 4 (four) hours as needed for mild pain. Patient taking differently: Take 650 mg by mouth in the morning and at bedtime. 08/01/18   Love, Ivan Anchors, PA-C  albuterol (VENTOLIN HFA) 108 (90 Base) MCG/ACT inhaler Inhale 2 puffs into the lungs daily as needed for wheezing or shortness of breath.    [provider]  allopurinol (ZYLOPRIM) 100 MG tablet Take 100 mg by mouth daily.    [provider]  butalbital-acetaminophen-caffeine (FIORICET) 50-325-40 MG tablet Take 1 tablet by mouth daily as needed for headache.    [provider]  camphor-menthol Timoteo Ace) lotion Apply 1 application  topically in the morning and at bedtime.    [provider]  Carboxymethylcellulose Sodium (THERATEARS) 0.25 % SOLN Place 1 drop into both eyes as needed (for dryness).    [provider]   cetirizine (ZYRTEC) 10 MG tablet Take 10 mg by mouth daily as needed for allergies.    [provider]  cyanocobalamin (,VITAMIN B-12,) 1000 MCG/ML injection Inject 1 mL (1,000 mcg total) into the muscle every 30 (thirty) days. 04/13/22   Elby Showers, MD  donepezil (ARICEPT) 10 MG tablet Take 1 tablet (10 mg total) by mouth at bedtime. Patient not taking: Reported on 08/28/2022 09/26/21   Melvenia Beam, MD  doxercalciferol (HECTOROL) 4 MCG/2ML injection Inject 2.5 mLs (5 mcg total) into the vein every Monday, Wednesday, and Friday with hemodialysis. 06/18/22   Allie Bossier, MD  fluticasone (FLONASE) 50 MCG/ACT nasal spray Place 1 spray into both nostrils daily. Patient taking differently: Place 1 spray into both nostrils daily as needed for allergies. 10/08/14   Mikhail, Velta Addison, DO  glucose blood (FREESTYLE LITE) test strip USE AS DIRECTED THREE TIMES DAILY 02/16/22   Elayne Snare, MD  hydrocortisone 2.5 % cream Apply 1 Application topically daily. 01/13/19   [provider]  lacosamide 100 MG TABS Take 1 tablet (100 mg total) by mouth 2 (two) times daily. 08/25/22   Thurnell Lose, MD  Lancets (FREESTYLE) lancets Use as instructed to check blood sugar 2 times per day dx code E11.65 02/22/16   Elayne Snare, MD  levothyroxine (SYNTHROID, LEVOTHROID) 50 MCG tablet Take 1  tablet (50 mcg total) by mouth daily. 08/06/14   Elby Showers, MD  memantine (NAMENDA) 10 MG tablet Take 1 tablet (10 mg total) by mouth 2 (two) times daily. 09/26/21   Melvenia Beam, MD  midodrine (PROAMATINE) 10 MG tablet Take one pill prior to hemodialysis on MWF Patient taking differently: Take 10 mg by mouth See admin instructions. Take 10 mg by mouth prior to hemodialysis on Mon/Wed/Fri 08/13/18   Love, Ivan Anchors, PA-C  Nutritional Supplements (FEEDING SUPPLEMENT, NEPRO CARB STEADY,) LIQD Take 237 mLs by mouth in the morning and at bedtime.    [provider]  omeprazole (PRILOSEC) 40 MG capsule  Take 1 capsule (40 mg total) by mouth daily. Patient taking differently: Take 40 mg by mouth daily before breakfast. 03/13/18   Esterwood, Amy S, PA-C  oxyCODONE-acetaminophen (PERCOCET/ROXICET) 5-325 MG tablet Take 1-2 tablets by mouth every 6 (six) hours as needed for moderate pain or severe pain (POSTOPERATIVE PAIN). 08/26/22   Georgeanna Harrison, MD  QUEtiapine (SEROQUEL) 25 MG tablet Take 1 tablet (25 mg total) by mouth at bedtime. 08/06/22   Elby Showers, MD  rosuvastatin (CRESTOR) 20 MG tablet Take 20 mg by mouth at bedtime.    [provider]  TRADJENTA 5 MG TABS tablet Take 1 tablet (5 mg total) by mouth daily. 11/16/21   Elayne Snare, MD      Allergies    Ambien [zolpidem tartrate] and Penicillins    Review of Systems   Review of Systems  Unable to perform ROS: Dementia  Respiratory:  Positive for shortness of breath.   All other systems reviewed and are negative.   Physical Exam Updated Vital Signs BP (!) 92/59   Pulse 74   Temp 100.3 F (37.9 C) (Axillary)   Resp (!) 21   Ht '5\' 8"'$  (1.727 m)   Wt 70 kg   SpO2 100%   BMI 23.46 kg/m  Physical Exam Vitals and nursing note reviewed.  Constitutional:      Appearance: He is well-developed.  HENT:     Head: Normocephalic and atraumatic.     Mouth/Throat:     Mouth: Mucous membranes are moist.     Pharynx: Oropharynx is clear.  Eyes:     Extraocular Movements: Extraocular movements intact.     Pupils: Pupils are equal, round, and reactive to light.  Cardiovascular:     Rate and Rhythm: Normal rate and regular rhythm.  Pulmonary:     Effort: Pulmonary effort is normal.     Breath sounds: Normal breath sounds.  Abdominal:     General: Bowel sounds are normal.     Palpations: Abdomen is soft.  Musculoskeletal:        General: Normal range of motion.     Cervical back: Normal range of motion and neck supple.     Comments: Right arm AVF  Skin:    General: Skin is warm.     Capillary Refill: Capillary refill  takes less than 2 seconds.  Neurological:     General: No focal deficit present.     Mental Status: He is alert. He is disoriented.  Psychiatric:        Mood and Affect: Mood normal.        Behavior: Behavior normal.     ED Results / Procedures / Treatments   Labs (all labs ordered are listed, but only abnormal results are displayed) Labs Reviewed  RESP PANEL BY RT-PCR (RSV, FLU A&B, COVID)  RVPGX2 - Abnormal; Notable for the following components:      Result Value   Influenza A by PCR POSITIVE (*)    All other components within normal limits  BASIC METABOLIC PANEL - Abnormal; Notable for the following components:   Chloride 97 (*)    Glucose, Bld 126 (*)    BUN 50 (*)    Creatinine, Ser 8.11 (*)    GFR, Estimated 6 (*)    Anion gap 16 (*)    All other components within normal limits  CBC WITH DIFFERENTIAL/PLATELET - Abnormal; Notable for the following components:   WBC 13.7 (*)    RBC 2.51 (*)    Hemoglobin 8.6 (*)    HCT 26.3 (*)    MCV 104.8 (*)    MCH 34.3 (*)    RDW 16.6 (*)    Neutro Abs 10.8 (*)    Monocytes Absolute 1.1 (*)    Abs Immature Granulocytes 0.09 (*)    All other components within normal limits  BRAIN NATRIURETIC PEPTIDE - Abnormal; Notable for the following components:   B Natriuretic Peptide 587.8 (*)    All other components within normal limits  CBG MONITORING, ED - Abnormal; Notable for the following components:   Glucose-Capillary 115 (*)    All other components within normal limits  TROPONIN I (HIGH SENSITIVITY) - Abnormal; Notable for the following components:   Troponin I (High Sensitivity) 73 (*)    All other components within normal limits  TROPONIN I (HIGH SENSITIVITY) - Abnormal; Notable for the following components:   Troponin I (High Sensitivity) 74 (*)    All other components within normal limits  CULTURE, BLOOD (ROUTINE X 2)  CULTURE, BLOOD (ROUTINE X 2)  LACTIC ACID, PLASMA  LACTIC ACID, PLASMA    EKG EKG  Interpretation  Date/Time:  Sunday September 09 2022 20:07:44 EST Ventricular Rate:  73 PR Interval:  196 QRS Duration: 102 QT Interval:  526 QTC Calculation: 580 R Axis:   -48 Text Interpretation: Sinus rhythm Left anterior fascicular block Probable anterior infarct, age indeterminate Prolonged QT interval Confirmed by Isla Pence [id number]) on 09/09/2022 8:27:20 PM  Radiology CT Angio Chest PE W and/or Wo Contrast  Result Date: 09/09/2022 CLINICAL DATA:  Shortness of breath for multiple days, with abdominal pain today. Hypoxic oxygen saturation 88%. Pulmonary embolism suspected. High probability. EXAM: CT ANGIOGRAPHY CHEST CT ABDOMEN AND PELVIS WITH CONTRAST TECHNIQUE: Multidetector CT imaging of the chest was performed using the standard protocol during bolus administration of intravenous contrast. Multiplanar CT image reconstructions and MIPs were obtained to evaluate the vascular anatomy. Multidetector CT imaging of the abdomen and pelvis was performed using the standard protocol during bolus administration of intravenous contrast. RADIATION DOSE REDUCTION: This exam was performed according to the departmental dose-optimization program which includes automated exposure control, adjustment of the mA and/or kV according to patient size and/or use of iterative reconstruction technique. CONTRAST:  60m OMNIPAQUE IOHEXOL 350 MG/ML SOLN COMPARISON:  Portable chest today, chest x-ray 08/17/2022, CTA chest 06/06/2022, CT thoracic and lumbar spine 08/25/2022, CT abdomen and pelvis no contrast 08/17/2022 and 04/09/2022, CTA abdomen and pelvis 06/06/2022. FINDINGS: CTA CHEST FINDINGS Cardiovascular: The pulmonary arteries are normal in caliber without evidence of thromboemboli. The cardiac size is normal. There is three-vessel coronary artery calcification. Thoracic aorta is heavily calcified with additional patchy moderate to heavy calcification in the great vessels. There is no aortic or great vessel  flow-limiting stenosis, aneurysm or dissection. The pulmonary veins are decompressed.  Mediastinum/Nodes: There are calcified mediastinal and right hilar lymph nodes. There are subcentimeter in short axis right paratracheal noncalcified lymph nodes and slightly prominent subcarinal lymph nodes again measuring up to 1.3 cm in short axis, stable. No further intrathoracic adenopathy is seen. Axillary spaces are clear. The thoracic trachea and thoracic esophagus are unremarkable. Thyroid is unremarkable. Lungs/Pleura: Previously there were small pleural effusions which have resolved since. The lungs are mildly emphysematous with mild paraseptal and centrilobular emphysematous changes in the upper lobes. There is new demonstration of a pleural-based 6 mm nodule in the posterior left upper lobe apex on 4:37. There is a stable 6 mm lingular nodule which has not changed in appearance since abdominopelvic CT 11/19/2018. There is continued bronchial thickening in the lower lobes. On the left, there is a small posterior basal consolidation merging with linear atelectatic bands above it, findings which could be due to pneumonia or atelectasis. Lungs are otherwise clear.  The main bronchi are patent. Musculoskeletal: There is a moderate chronic bow tie compression deformity of the C7 vertebral body, mild upper plate anterior wedging chronically T1, 3, 8 and 12 with no new or worsening compression injury. Generalized osteopenia. There is no displaced rib fracture. Mild right-sided subareolar gynecomastia. Review of the MIP images confirms the above findings. CT ABDOMEN and PELVIS FINDINGS Hepatobiliary: No significant liver abnormality is seen. No gallstones, gallbladder wall thickening, or biliary dilatation. There are stable scattered subcentimeter hepatic hypodensities most likely small cysts. Pancreas: No abnormality. Spleen: No abnormality. Adrenals/Urinary Tract: No adrenal mass. There is diffuse bilateral end-stage renal  atrophy with numerous bilateral cysts. No solid lesion is suspected with some of the hypodensities in both kidneys are too small to adequately characterize. There is no hydronephrosis or stone. The bladder is contracted and not well seen, again noted somewhat thickened and indistinct but there may be increased perivesical stranding which could indicate cystitis. Stomach/Bowel: No dilatation or wall thickening. Uncomplicated sigmoid diverticulosis. An appendix is not seen in this patient. Vascular/Lymphatic: Extensive aortoiliac and branch vessel atherosclerotic versus with patent aortobifem bypass graft. 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 No lymphadenopathy is seen. Reproductive: No prostatomegaly. Other: Surgical changes broad-based abdominal wall repair. No incarcerated hernia. Trace presacral ascites is similar to prior studies. No free air, free hemorrhage or abscess. Musculoskeletal: Osteopenia. Severe chronic L1 compression fracture again noted, treated L3 compression fracture with kyphoplasty cement extruded into the L2-3 disc space, with moderate chronic wedging of L2-5. Bilateral hip nailing hardware creates streak artifact, with left hip intertrochanteric fracture first seen on 08/25/2022. Fracture fixation alignment approximates anatomic. Review of the MIP images confirms the above findings. IMPRESSION: 1. No evidence of pulmonary arterial dilatation or embolus. 2. Aortic and coronary artery atherosclerosis.  Normal cardiac size. 3. Small posterior basal left lower lobe consolidation versus atelectasis. 4. 6 mm pleural-based nodule in the posterior left upper lobe apex, new from 06/06/2022. Follow-up CT 6-12 months recommended per Fleischner guidelines. 5. Stable 6 mm lingular nodule  presumably benign. 6. Chronic bronchial thickening in the lower lobes. 7. Stable mildly prominent subcarinal lymph node. 8. Chronic end-stage renal atrophy with numerous cysts. No hydronephrosis or  stone. 9. Possible cystitis. 10. Osteopenia and degenerative changes with multiple chronic vertebral body compression fractures. No new fracture is seen. 11. Extensive aortoiliac and branch vessel atherosclerosis with patent aortobifem bypass graft. 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 12. Bilateral hip nailing hardware with left hip intertrochanteric fracture first seen on 08/25/2022. Fracture fixation alignment  approximates anatomic. 13. Emphysema. Emphysema (ICD10-J43.9). Electronically Signed   By: Telford Nab M.D.   On: 09/09/2022 23:31   CT ABDOMEN PELVIS W CONTRAST  Result Date: 09/09/2022 CLINICAL DATA:  Shortness of breath for multiple days, with abdominal pain today. Hypoxic oxygen saturation 88%. Pulmonary embolism suspected. High probability. EXAM: CT ANGIOGRAPHY CHEST CT ABDOMEN AND PELVIS WITH CONTRAST TECHNIQUE: Multidetector CT imaging of the chest was performed using the standard protocol during bolus administration of intravenous contrast. Multiplanar CT image reconstructions and MIPs were obtained to evaluate the vascular anatomy. Multidetector CT imaging of the abdomen and pelvis was performed using the standard protocol during bolus administration of intravenous contrast. RADIATION DOSE REDUCTION: This exam was performed according to the departmental dose-optimization program which includes automated exposure control, adjustment of the mA and/or kV according to patient size and/or use of iterative reconstruction technique. CONTRAST:  51m OMNIPAQUE IOHEXOL 350 MG/ML SOLN COMPARISON:  Portable chest today, chest x-ray 08/17/2022, CTA chest 06/06/2022, CT thoracic and lumbar spine 08/25/2022, CT abdomen and pelvis no contrast 08/17/2022 and 04/09/2022, CTA abdomen and pelvis 06/06/2022. FINDINGS: CTA CHEST FINDINGS Cardiovascular: The pulmonary arteries are normal in caliber without evidence of thromboemboli. The cardiac size is normal. There is three-vessel coronary  artery calcification. Thoracic aorta is heavily calcified with additional patchy moderate to heavy calcification in the great vessels. There is no aortic or great vessel flow-limiting stenosis, aneurysm or dissection. The pulmonary veins are decompressed. Mediastinum/Nodes: There are calcified mediastinal and right hilar lymph nodes. There are subcentimeter in short axis right paratracheal noncalcified lymph nodes and slightly prominent subcarinal lymph nodes again measuring up to 1.3 cm in short axis, stable. No further intrathoracic adenopathy is seen. Axillary spaces are clear. The thoracic trachea and thoracic esophagus are unremarkable. Thyroid is unremarkable. Lungs/Pleura: Previously there were small pleural effusions which have resolved since. The lungs are mildly emphysematous with mild paraseptal and centrilobular emphysematous changes in the upper lobes. There is new demonstration of a pleural-based 6 mm nodule in the posterior left upper lobe apex on 4:37. There is a stable 6 mm lingular nodule which has not changed in appearance since abdominopelvic CT 11/19/2018. There is continued bronchial thickening in the lower lobes. On the left, there is a small posterior basal consolidation merging with linear atelectatic bands above it, findings which could be due to pneumonia or atelectasis. Lungs are otherwise clear.  The main bronchi are patent. Musculoskeletal: There is a moderate chronic bow tie compression deformity of the C7 vertebral body, mild upper plate anterior wedging chronically T1, 3, 8 and 12 with no new or worsening compression injury. Generalized osteopenia. There is no displaced rib fracture. Mild right-sided subareolar gynecomastia. Review of the MIP images confirms the above findings. CT ABDOMEN and PELVIS FINDINGS Hepatobiliary: No significant liver abnormality is seen. No gallstones, gallbladder wall thickening, or biliary dilatation. There are stable scattered subcentimeter hepatic  hypodensities most likely small cysts. Pancreas: No abnormality. Spleen: No abnormality. Adrenals/Urinary Tract: No adrenal mass. There is diffuse bilateral end-stage renal atrophy with numerous bilateral cysts. No solid lesion is suspected with some of the hypodensities in both kidneys are too small to adequately characterize. There is no hydronephrosis or stone. The bladder is contracted and not well seen, again noted somewhat thickened and indistinct but there may be increased perivesical stranding which could indicate cystitis. Stomach/Bowel: No dilatation or wall thickening. Uncomplicated sigmoid diverticulosis. An appendix is not seen in this patient. Vascular/Lymphatic: Extensive aortoiliac and branch vessel atherosclerotic versus with  patent aortobifem bypass graft. 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 No lymphadenopathy is seen. Reproductive: No prostatomegaly. Other: Surgical changes broad-based abdominal wall repair. No incarcerated hernia. Trace presacral ascites is similar to prior studies. No free air, free hemorrhage or abscess. Musculoskeletal: Osteopenia. Severe chronic L1 compression fracture again noted, treated L3 compression fracture with kyphoplasty cement extruded into the L2-3 disc space, with moderate chronic wedging of L2-5. Bilateral hip nailing hardware creates streak artifact, with left hip intertrochanteric fracture first seen on 08/25/2022. Fracture fixation alignment approximates anatomic. Review of the MIP images confirms the above findings. IMPRESSION: 1. No evidence of pulmonary arterial dilatation or embolus. 2. Aortic and coronary artery atherosclerosis.  Normal cardiac size. 3. Small posterior basal left lower lobe consolidation versus atelectasis. 4. 6 mm pleural-based nodule in the posterior left upper lobe apex, new from 06/06/2022. Follow-up CT 6-12 months recommended per Fleischner guidelines. 5. Stable 6 mm lingular nodule  presumably benign. 6. Chronic  bronchial thickening in the lower lobes. 7. Stable mildly prominent subcarinal lymph node. 8. Chronic end-stage renal atrophy with numerous cysts. No hydronephrosis or stone. 9. Possible cystitis. 10. Osteopenia and degenerative changes with multiple chronic vertebral body compression fractures. No new fracture is seen. 11. Extensive aortoiliac and branch vessel atherosclerosis with patent aortobifem bypass graft. 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 12. Bilateral hip nailing hardware with left hip intertrochanteric fracture first seen on 08/25/2022. Fracture fixation alignment approximates anatomic. 13. Emphysema. Emphysema (ICD10-J43.9). Electronically Signed   By: Telford Nab M.D.   On: 09/09/2022 23:31   DG Chest Port 1 View  Result Date: 09/09/2022 CLINICAL DATA:  Shortness of breath EXAM: PORTABLE CHEST 1 VIEW COMPARISON:  Chest x-ray 08/17/2022 FINDINGS: There is a band of opacity in the left lung base. The lungs are otherwise clear. No pleural effusion or pneumothorax. There are severe atherosclerotic calcifications of the aorta. Cardiomediastinal silhouette is within normal limits. No acute fractures. IMPRESSION: Band of opacity in the left lung base, atelectasis versus infection. Electronically Signed   By: Ronney Asters M.D.   On: 09/09/2022 19:26    Procedures Procedures    Medications Ordered in ED Medications  cefTRIAXone (ROCEPHIN) 1 g in sodium chloride 0.9 % 100 mL IVPB (has no administration in time range)  azithromycin (ZITHROMAX) 500 mg in sodium chloride 0.9 % 250 mL IVPB (has no administration in time range)  acetaminophen (TYLENOL) tablet 1,000 mg (1,000 mg Oral Given 09/09/22 2301)  iohexol (OMNIPAQUE) 350 MG/ML injection 75 mL (75 mLs Intravenous Contrast Given 09/09/22 2218)  oseltamivir (TAMIFLU) capsule 30 mg (30 mg Oral Given 09/09/22 2308)    ED Course/ Medical Decision Making/ A&P                           Medical Decision Making Amount  and/or Complexity of Data Reviewed Labs: ordered. Radiology: ordered.  Risk OTC drugs. Prescription drug management. Decision regarding hospitalization.   This patient presents to the ED for concern of sob, this involves an extensive number of treatment options, and is a complaint that carries with it a high risk of complications and morbidity.  The differential diagnosis includes covid/flu/rsv, pna, pulmonary edema, anemia, PE, electrolyte abn   Co morbidities that complicate the patient evaluation  htn, hld, paroxysmal afib (not on thinners due to hx gib), ed, pvd, hypothyroidism, gerd, gout, dm2, cad, dementia, GI bleed, chronic anemia and ESRD on HD.     Additional  history obtained:  Additional history obtained from epic chart review External records from outside source obtained and reviewed including EMS report   Lab Tests:  I Ordered, and personally interpreted labs.  The pertinent results include:  cbc with wbc elevated at 13.7, hgb 8.6 (chronic); glucose 126, bun 50 and cr 8.11 (chronic); trop 73 (trop 666 on 9/22)   Imaging Studies ordered:  I ordered imaging studies including cxr and ct chest/abd/pelvis I independently visualized and interpreted imaging which showed  CXR: Band of opacity in the left lung base, atelectasis versus infection.  CT chest/abd/pelvis: IMPRESSION:  1. No evidence of pulmonary arterial dilatation or embolus.  2. Aortic and coronary artery atherosclerosis.  Normal cardiac size.  3. Small posterior basal left lower lobe consolidation versus  atelectasis.  4. 6 mm pleural-based nodule in the posterior left upper lobe apex,  new from 06/06/2022. Follow-up CT 6-12 months recommended per  Fleischner guidelines.  5. Stable 6 mm lingular nodule  presumably benign.  6. Chronic bronchial thickening in the lower lobes.  7. Stable mildly prominent subcarinal lymph node.  8. Chronic end-stage renal atrophy with numerous cysts. No  hydronephrosis  or stone.  9. Possible cystitis.  10. Osteopenia and degenerative changes with multiple chronic  vertebral body compression fractures. No new fracture is seen.  11. Extensive aortoiliac and branch vessel atherosclerosis with  patent aortobifem bypass graft. Severe calcific stenosis proximal  celiac trunk and SMA.  12. Bilateral hip nailing hardware with left hip intertrochanteric  fracture first seen on 08/25/2022. Fracture fixation alignment  approximates anatomic.  13. Emphysema.    Emphysema (ICD10-J43.9).    I agree with the radiologist interpretation   Cardiac Monitoring:  The patient was maintained on a cardiac monitor.  I personally viewed and interpreted the cardiac monitored which showed an underlying rhythm of: nsr   Medicines ordered and prescription drug management:  I ordered medication including tylenol  for fever  Reevaluation of the patient after these medicines showed that the patient improved I have reviewed the patients home medicines and have made adjustments as needed   Test Considered:  ct   Critical Interventions:  oxygen   Consultations Obtained:  I requested consultation with the hospitalist (Dr. Flossie Buffy),  and discussed lab and imaging findings as well as pertinent plan - she will admit   Problem List / ED Course:  Influenza A:  pt given the dialysis dosing (30 mg) of Tamiflu.  He is requiring 2L oxygen.  Wife said he's had his flu shot. Possible consolidation on CT chest:  pt started on rocephin/zithromax.  No evidence of sepsis.  Cultures ordered.  Lactic nl ESRD:  pt will need dialysis tomorrow   Reevaluation:  After the interventions noted above, I reevaluated the patient and found that they have :improved   Social Determinants of Health:  Lives in SNF   Dispostion:  After consideration of the diagnostic results and the patients response to treatment, I feel that the patent would benefit from admission.    CRITICAL  CARE Performed by: Isla Pence   Total critical care time: 30 minutes  Critical care time was exclusive of separately billable procedures and treating other patients.  Critical care was necessary to treat or prevent imminent or life-threatening deterioration.  Critical care was time spent personally by me on the following activities: development of treatment plan with patient and/or surrogate as well as nursing, discussions with consultants, evaluation of patient's response to treatment, examination of patient, obtaining history  from patient or surrogate, ordering and performing treatments and interventions, ordering and review of laboratory studies, ordering and review of radiographic studies, pulse oximetry and re-evaluation of patient's condition.         Final Clinical Impression(s) / ED Diagnoses Final diagnoses:  Influenza A  Acute respiratory failure with hypoxia (HCC)  ESRD on hemodialysis ([place])  Community acquired pneumonia of left lower lobe of lung    Rx / DC Orders ED Discharge Orders     None         Isla Pence, MD 09/09/22 2337

## 2022-09-09 NOTE — ED Notes (Signed)
Pt taken to CT.

## 2022-09-09 NOTE — ED Triage Notes (Signed)
Pt BIB EMS from [facility] facility after SOB for multiple days. Sat 88% on RA with EMS. Staff at facility stated he was being less combative than normal. No reported fever. Aox0.

## 2022-09-09 NOTE — H&P (Signed)
History and Physical    Patient: [name] MCN:[id number] DOB: 14-Jun-1941 DOA: 09/09/2022 DOS: the patient was seen and examined on 09/10/2022 PCP: Elby Showers, MD  Patient coming from: SNF  Chief Complaint:  Chief Complaint  Patient presents with   Shortness of Breath   HPI: [name] is a 81 y.o. male with medical history significant of Alzheimer's dementia, ESRD on HD TTS, hypertension, type 2 diabetes, coronary artery disease, peripheral vascular disease who presents with hypoxia.   Pt only oriented to self. History obtained from ED documentation. Reportedly sent in by SNF for increasing shortness of breath for the past several days.  Found to be hypoxic at 88% oxygen saturation on room air by EMS. He was recently just discharged on 08/30/22 following fixture of hip fracture from a fall.   In the ED, he was requiring 2 L via nasal cannula.  BP of 121/50s.  WBC of 13.7, hemoglobin around baseline at 8.6.  Lactate within normal limits.  No significant electrolyte abnormalities.  Creatinine of 8.11 with anion gap of 16.  CBG of 126.  Troponin elevated but flat at 73 and 79.  BNP of 587.  He was found to be influenza A positive.   Review of Systems: unable to review all systems due to the inability of the patient to answer questions. Past Medical History:  Diagnosis Date   Allergy    Alzheimer's disease ([place]) 01/19/2021   Anemia    Arthritis    Cataract    bil cateracts removed   Coronary artery disease    Diabetes mellitus    Type 2   Diverticulitis    ED (erectile dysfunction)    Elevated homocysteine    ESRD (end stage renal disease) on dialysis ([place]) 03/2015   M-W-F dialysis   GERD (gastroesophageal reflux disease)    pepto    Gout    Hiatal hernia    Hyperlipidemia    Hypertension    Hypothyroidism    PVD (peripheral vascular disease) ([place])    has plastic aorta   Renal insufficiency    Sleep apnea    does not wear c-pap   Past Surgical  History:  Procedure Laterality Date   ANGIOPLASTY Right 01/26/2021   Procedure: ANGIOPLASTY RIGHT SUBCLAVIAN VEIN;  Surgeon: Serafina Mitchell, MD;  Location: [place];  Service: Vascular;  Laterality: Right;   aortobifemoral bypass     AV FISTULA PLACEMENT Left 12/01/2013   Procedure: ARTERIOVENOUS (AV) FISTULA CREATION- LEFT BRACHIOCEPHALIC;  Surgeon: Angelia Mould, MD;  Location: [place];  Service: Vascular;  Laterality: Left;   BASCILIC VEIN TRANSPOSITION Right 07/27/2014   Procedure: BASCILIC VEIN TRANSPOSITION;  Surgeon: Angelia Mould, MD;  Location: [place];  Service: Vascular;  Laterality: Right;   BRAIN SURGERY  07/22/2018   BREAST SURGERY     left - granulomatous mastitis   BURR HOLE Bilateral 07/22/2018   Procedure: BILATERAL BURR HOLES;  Surgeon: Kristeen Miss, MD;  Location: [place];  Service: Neurosurgery;  Laterality: Bilateral;   COLONOSCOPY     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 ([place] [facility])  1992   ENTEROSCOPY N/A 06/03/2018   Procedure: ENTEROSCOPY;  Surgeon: Lavena Bullion, DO;  Location: [facility];  Service: Gastroenterology;  Laterality: N/A;   ENTEROSCOPY N/A 11/20/2018   Procedure: ENTEROSCOPY;  Surgeon: Rush Landmark Telford Nab., MD;  Location: [place];  Service: Gastroenterology;  Laterality: N/A;   ENTEROSCOPY N/A 12/03/2019   Procedure: ENTEROSCOPY;  Surgeon:  Cirigliano, Dominic Pea, DO;  Location: [facility] ENDOSCOPY;  Service: Gastroenterology;  Laterality: N/A;  push enteroscopy   ENTEROSCOPY N/A 07/04/2021   Procedure: ENTEROSCOPY;  Surgeon: Sharyn Creamer, MD;  Location: [facility] ENDOSCOPY;  Service: Gastroenterology;  Laterality: N/A;   ENTEROSCOPY N/A 11/05/2021   Procedure: ENTEROSCOPY;  Surgeon: Daryel November, MD;  Location: [facility] ENDOSCOPY;  Service: Gastroenterology;  Laterality: N/A;   EYE SURGERY Bilateral    cataracts   FISTULOGRAM Right 01/26/2021   Procedure: FISTULOGRAM RIGHT;  Surgeon: Serafina Mitchell, MD;  Location: [facility] OR;  Service: Vascular;   Laterality: Right;   HEMODIALYSIS INPATIENT  01/17/2018       HOT HEMOSTASIS N/A 06/03/2018   Procedure: HOT HEMOSTASIS (ARGON PLASMA COAGULATION/BICAP);  Surgeon: Lavena Bullion, DO;  Location: [facility] ENDOSCOPY;  Service: Gastroenterology;  Laterality: N/A;   HOT HEMOSTASIS N/A 11/20/2018   Procedure: HOT HEMOSTASIS (ARGON PLASMA COAGULATION/BICAP);  Surgeon: Irving Copas., MD;  Location: [place];  Service: Gastroenterology;  Laterality: N/A;   HOT HEMOSTASIS N/A 12/03/2019   Procedure: HOT HEMOSTASIS (ARGON PLASMA COAGULATION/BICAP);  Surgeon: Lavena Bullion, DO;  Location: [facility]OS[facility];  Service: Gastroenterology;  Laterality: N/A;   INTRAMEDULLARY (IM) NAIL INTERTROCHANTERIC Right 11/08/2020   Procedure: INTRAMEDULLARY (IM) NAIL INTERTROCHANTRIC;  Surgeon: Erle Crocker, MD;  Location: [place];  Service: Orthopedics;  Laterality: Right;   INTRAMEDULLARY (IM) NAIL INTERTROCHANTERIC Left 08/26/2022   Procedure: INTRAMEDULLARY (IM) NAIL INTERTROCHANTERIC;  Surgeon: Georgeanna Harrison, MD;  Location: [place];  Service: Orthopedics;  Laterality: Left;   REVISION OF ARTERIOVENOUS GORETEX GRAFT Right 09/01/2019   Procedure: REVISION OF ARTERIOVENOUS FISTULA RIGHT ARM;  Surgeon: Serafina Mitchell, MD;  Location: [place];  Service: Vascular;  Laterality: Right;   REVISON OF ARTERIOVENOUS FISTULA Left 02/09/2014   Procedure: REVISON OF LEFT ARTERIOVENOUS FISTULA - RESECTION OF RENDUNDANT VEIN;  Surgeon: Angelia Mould, MD;  Location: [place];  Service: Vascular;  Laterality: Left;   REVISON OF ARTERIOVENOUS FISTULA Right 01/26/2021   Procedure: REVISON OF ARTERIOVENOUS FISTULA RIGHT;  Surgeon: Serafina Mitchell, MD;  Location: [place];  Service: Vascular;  Laterality: Right;   SBO with lysis adhesions     SHUNTOGRAM Left 04/19/2014   Procedure: FISTULOGRAM;  Surgeon: Angelia Mould, MD;  Location: [facility] CATH LAB;  Service: Cardiovascular;  Laterality: Left;   UNILATERAL UPPER EXTREMEITY  ANGIOGRAM N/A 07/12/2014   Procedure: UNILATERAL UPPER Anselmo Rod;  Surgeon: Angelia Mould, MD;  Location[facility] C[facility] LAB;  Service: Cardiovascular;  Laterality: N/A;   Social History:  reports that he quit smoking about 27 years ago. His smoking use included cigarettes. He has never been exposed to tobacco smoke. He has never used smokeless tobacco. He reports that he does not drink alcohol and does not use drugs.  Allergies  Allergen Reactions   Ambien [Zolpidem Tartrate] Other (See Comments)    Hallucinations and "felt crazy". Confusion    Penicillins Hives, Swelling and Rash    Has patient had a PCN reaction causing immediate rash, facial/tongue/throat swelling, SOB or lightheadedness with hypotension: Yes Has patient had a PCN reaction causing severe rash involving mucus membranes or skin necrosis: Yes Has patient had a PCN reaction that required hospitalization: No Has patient had a PCN reaction occurring within the last 10 years: No If all of the above answers are "NO", then may proceed with Cephalosporin use.     Family History  Problem Relation Age of Onset   Aneurysm Mother  brain   Heart disease Father    Stroke Father    Hypertension Father    Diabetes Father    Dementia Neg Hx    Colon cancer Neg Hx    Esophageal cancer Neg Hx    Pancreatic cancer Neg Hx    Prostate cancer Neg Hx    Rectal cancer Neg Hx    Stomach cancer Neg Hx     Prior to Admission medications   Medication Sig Start Date End Date Taking? Authorizing Provider  acetaminophen (TYLENOL) 325 MG tablet Take 1-2 tablets (325-650 mg total) by mouth every 4 (four) hours as needed for mild pain. Patient taking differently: Take 650 mg by mouth in the morning and at bedtime. 08/01/18   Love, Ivan Anchors, PA-C  albuterol (VENTOLIN HFA) 108 (90 Base) MCG/ACT inhaler Inhale 2 puffs into the lungs daily as needed for wheezing or shortness of breath.    [provider]  allopurinol  (ZYLOPRIM) 100 MG tablet Take 100 mg by mouth daily.    [provider]  butalbital-acetaminophen-caffeine (FIORICET) 50-325-40 MG tablet Take 1 tablet by mouth daily as needed for headache.    [provider]  camphor-menthol Timoteo Ace) lotion Apply 1 application  topically in the morning and at bedtime.    [provider]  Carboxymethylcellulose Sodium (THERATEARS) 0.25 % SOLN Place 1 drop into both eyes as needed (for dryness).    [provider]  cetirizine (ZYRTEC) 10 MG tablet Take 10 mg by mouth daily as needed for allergies.    [provider]  cyanocobalamin (,VITAMIN B-12,) 1000 MCG/ML injection Inject 1 mL (1,000 mcg total) into the muscle every 30 (thirty) days. 04/13/22   Elby Showers, MD  donepezil (ARICEPT) 10 MG tablet Take 1 tablet (10 mg total) by mouth at bedtime. Patient not taking: Reported on 08/28/2022 09/26/21   Melvenia Beam, MD  doxercalciferol (HECTOROL) 4 MCG/2ML injection Inject 2.5 mLs (5 mcg total) into the vein every Monday, Wednesday, and Friday with hemodialysis. 06/18/22   Allie Bossier, MD  fluticasone (FLONASE) 50 MCG/ACT nasal spray Place 1 spray into both nostrils daily. Patient taking differently: Place 1 spray into both nostrils daily as needed for allergies. 10/08/14   Mikhail, Velta Addison, DO  glucose blood (FREESTYLE LITE) test strip USE AS DIRECTED THREE TIMES DAILY 02/16/22   Elayne Snare, MD  hydrocortisone 2.5 % cream Apply 1 Application topically daily. 01/13/19   [provider]  lacosamide 100 MG TABS Take 1 tablet (100 mg total) by mouth 2 (two) times daily. 08/25/22   Thurnell Lose, MD  Lancets (FREESTYLE) lancets Use as instructed to check blood sugar 2 times per day dx code E11.65 02/22/16   Elayne Snare, MD  levothyroxine (SYNTHROID, LEVOTHROID) 50 MCG tablet Take 1 tablet (50 mcg total) by mouth daily. 08/06/14   Elby Showers, MD  memantine (NAMENDA) 10 MG tablet Take 1 tablet (10 mg total) by  mouth 2 (two) times daily. 09/26/21   Melvenia Beam, MD  midodrine (PROAMATINE) 10 MG tablet Take one pill prior to hemodialysis on MWF Patient taking differently: Take 10 mg by mouth See admin instructions. Take 10 mg by mouth prior to hemodialysis on Mon/Wed/Fri 08/13/18   Love, Ivan Anchors, PA-C  Nutritional Supplements (FEEDING SUPPLEMENT, NEPRO CARB STEADY,) LIQD Take 237 mLs by mouth in the morning and at bedtime.    [provider]  omeprazole (PRILOSEC) 40 MG capsule Take 1 capsule (40 mg total)  by mouth daily. Patient taking differently: Take 40 mg by mouth daily before breakfast. 03/13/18   Esterwood, Amy S, PA-C  oxyCODONE-acetaminophen (PERCOCET/ROXICET) 5-325 MG tablet Take 1-2 tablets by mouth every 6 (six) hours as needed for moderate pain or severe pain (POSTOPERATIVE PAIN). 08/26/22   Georgeanna Harrison, MD  QUEtiapine (SEROQUEL) 25 MG tablet Take 1 tablet (25 mg total) by mouth at bedtime. 08/06/22   Elby Showers, MD  rosuvastatin (CRESTOR) 20 MG tablet Take 20 mg by mouth at bedtime.    [provider]  TRADJENTA 5 MG TABS tablet Take 1 tablet (5 mg total) by mouth daily. 11/16/21   Elayne Snare, MD    Physical Exam: Vitals:   09/09/22 2315 09/09/22 2332 09/10/22 0002 09/10/22 0030  BP:  (!) 92/59  (!) 100/51  Pulse: 74 74  69  Resp: 18 (!) 21  16  Temp:   99.4 F (37.4 C)   TempSrc:   Axillary   SpO2: 100% 100%  99%  Weight:      Height:       Constitutional: NAD, calm, comfortable, elderly male in bed asleep Eyes: lids and conjunctivae normal ENMT: Mucous membranes are moist.  Neck: normal, supple,  Respiratory: clear to auscultation bilaterally, no wheezing, no crackles. Normal respiratory effort. No accessory muscle use.  Cardiovascular: Regular rate and rhythm, no murmurs / rubs / gallops. No extremity edema. Left UE AV fistula. Abdomen: no tenderness, Bowel sounds positive.  Musculoskeletal: no clubbing / cyanosis. No joint deformity upper and lower  extremities.  Skin: no rashes, lesions, ulcers.  Neurologic: CN 2-12 grossly intact. Alert and oriented to self only. Awoke easily to voice.  Psychiatric: Advanced dementia. No agitation. Data Reviewed:  See HPI  Assessment and Plan: * Acute respiratory failure with hypoxia ([place]) -secondary to influenza A pneumonia although he was started on IV Rocephin and Azithromycin in the ED after CTA chest showed possible posterior basal pneumonia. Will check procalcitonin -Tamflu '30mg'$  every other day for renal dosing  -goal O2 >92%. Currently requiring 2L.    Lung nodule  6 mm pleural-based nodule in the posterior left upper lobe apex new from 06/06/2022 seen incidentally on CTA chest. Follow-up CT 6-12 months recommended per Fleischner guidelines.  Dementia without behavioral disturbance ([place]) -pt alert and oriented to self at baseline  ESRD on dialysis [facility]) -HD MWF -Needs nephrology consult for scheduled dialysis  Hypothyroidism -continue levothyoxine  Essential hypertension BP actually on the softer side. He is on midodrine which we will continue.   Non-insulin dependent type 2 diabetes mellitus ([place]) -most recent A1C of 6.5. Keep on sensitive SSI      Advance Care Planning:DNR- legal document at bedside  Consults: none  Family Communication: none at bedside  Severity of Illness: The appropriate patient status for this patient is INPATIENT. Inpatient status is judged to be reasonable and necessary in order to provide the required intensity of service to ensure the patient's safety. The patient's presenting symptoms, physical exam findings, and initial radiographic and laboratory data in the context of their chronic comorbidities is felt to place them at high risk for further clinical deterioration. Furthermore, it is not anticipated that the patient will be medically stable for discharge from the hospital within 2 midnights of admission.   * I certify that at the point of  admission it is my clinical judgment that the patient will require inpatient hospital care spanning beyond 2 midnights from the point of admission due to high  intensity of service, high risk for further deterioration and high frequency of surveillance required.*  Author: Orene Desanctis, DO 09/10/2022 12:53 AM  For on call review [email or web address].

## 2022-09-10 ENCOUNTER — Encounter (HOSPITAL_COMMUNITY): Payer: Self-pay | Admitting: Family Medicine

## 2022-09-10 DIAGNOSIS — R911 Solitary pulmonary nodule: Secondary | ICD-10-CM

## 2022-09-10 DIAGNOSIS — J9601 Acute respiratory failure with hypoxia: Secondary | ICD-10-CM | POA: Diagnosis not present

## 2022-09-10 LAB — BASIC METABOLIC PANEL
Anion gap: 15 (ref 5–15)
BUN: 58 mg/dL — ABNORMAL HIGH (ref 8–23)
CO2: 30 mmol/L (ref 22–32)
Calcium: 9.7 mg/dL (ref 8.9–10.3)
Chloride: 96 mmol/L — ABNORMAL LOW (ref 98–111)
Creatinine, Ser: 8.85 mg/dL — ABNORMAL HIGH (ref 0.61–1.24)
GFR, Estimated: 6 mL/min — ABNORMAL LOW (ref >60)
Glucose, Bld: 110 mg/dL — ABNORMAL HIGH (ref 70–99)
Potassium: 3.6 mmol/L (ref 3.5–5.1)
Sodium: 141 mmol/L (ref 135–145)

## 2022-09-10 LAB — CBC
HCT: 24 % — ABNORMAL LOW (ref 39.0–52.0)
Hemoglobin: 7.9 g/dL — ABNORMAL LOW (ref 13.0–17.0)
MCH: 34.2 pg — ABNORMAL HIGH (ref 26.0–34.0)
MCHC: 32.9 g/dL (ref 30.0–36.0)
MCV: 103.9 fL — ABNORMAL HIGH (ref 80.0–100.0)
Platelets: 206 10*3/uL (ref 150–400)
RBC: 2.31 MIL/uL — ABNORMAL LOW (ref 4.22–5.81)
RDW: 16.7 % — ABNORMAL HIGH (ref 11.5–15.5)
WBC: 14.1 10*3/uL — ABNORMAL HIGH (ref 4.0–10.5)
nRBC: 0 % (ref 0.0–0.2)

## 2022-09-10 LAB — CBG MONITORING, ED
Glucose-Capillary: 127 mg/dL — ABNORMAL HIGH (ref 70–99)
Glucose-Capillary: 106 mg/dL — ABNORMAL HIGH (ref 70–99)

## 2022-09-10 LAB — GLUCOSE, CAPILLARY: Glucose-Capillary: 96 mg/dL (ref 70–99)

## 2022-09-10 LAB — LACTIC ACID, PLASMA: Lactic Acid, Venous: 1.3 mmol/L (ref 0.5–1.9)

## 2022-09-10 LAB — HEPATITIS B SURFACE ANTIGEN: Hepatitis B Surface Ag: NONREACTIVE

## 2022-09-10 LAB — PROCALCITONIN: Procalcitonin: 2.95 ng/mL

## 2022-09-10 MED ORDER — LIDOCAINE HCL (PF) 1 % IJ SOLN: 5.0000 mL | INTRAMUSCULAR | Status: DC | PRN

## 2022-09-10 MED ORDER — CHLORHEXIDINE GLUCONATE CLOTH 2 % EX PADS
6.0000 | MEDICATED_PAD | Freq: Every day | CUTANEOUS | Status: DC
  Administered 2022-09-11: 6 via TOPICAL

## 2022-09-10 MED ORDER — DARBEPOETIN ALFA 100 MCG/0.5ML IJ SOSY
100.0000 ug | PREFILLED_SYRINGE | INTRAMUSCULAR | Status: DC
  Filled 2022-09-10: qty 0.5

## 2022-09-10 MED ORDER — MIDODRINE HCL 5 MG PO TABS
10.0000 mg | ORAL_TABLET | ORAL | Status: DC
  Administered 2022-09-10: 10 mg via ORAL
  Filled 2022-09-10 (×2): qty 2

## 2022-09-10 MED ORDER — ALBUMIN HUMAN 25 % IV SOLN
INTRAVENOUS | Status: AC
  Administered 2022-09-10: 12.5 g
  Filled 2022-09-10: qty 100

## 2022-09-10 MED ORDER — OSELTAMIVIR PHOSPHATE 30 MG PO CAPS
30.0000 mg | ORAL_CAPSULE | ORAL | Status: DC
  Administered 2022-09-11: 30 mg via ORAL
  Filled 2022-09-10: qty 1

## 2022-09-10 MED ORDER — INSULIN ASPART 100 UNIT/ML IJ SOLN
0.0000 [IU] | Freq: Three times a day (TID) | INTRAMUSCULAR | Status: DC
  Administered 2022-09-10: 1 [IU] via SUBCUTANEOUS

## 2022-09-10 MED ORDER — HEPARIN SODIUM (PORCINE) 5000 UNIT/ML IJ SOLN
5000.0000 [IU] | Freq: Three times a day (TID) | INTRAMUSCULAR | Status: DC
  Administered 2022-09-10 – 2022-09-11 (×3): 5000 [IU] via SUBCUTANEOUS
  Filled 2022-09-10 (×3): qty 1

## 2022-09-10 MED ORDER — LIDOCAINE-PRILOCAINE 2.5-2.5 % EX CREA: 1.0000 | TOPICAL_CREAM | CUTANEOUS | Status: DC | PRN

## 2022-09-10 MED ORDER — METOPROLOL TARTRATE 50 MG PO TABS
50.0000 mg | ORAL_TABLET | Freq: Two times a day (BID) | ORAL | Status: DC
  Administered 2022-09-11 (×2): 50 mg via ORAL
  Filled 2022-09-10 (×2): qty 1

## 2022-09-10 MED ORDER — PENTAFLUOROPROP-TETRAFLUOROETH EX AERO: 1.0000 | INHALATION_SPRAY | CUTANEOUS | Status: DC | PRN

## 2022-09-10 NOTE — Assessment & Plan Note (Addendum)
6 mm pleural-based nodule in the posterior left upper lobe apex new from 06/06/2022 seen incidentally on CTA chest. Follow-up CT 6-12 months recommended per Fleischner guidelines.

## 2022-09-10 NOTE — Significant Event (Addendum)
Rapid Response Event Note   Reason for Call :  Elevated HR, fluctuating blood pressures  Pt HR converted to Atrial fibrillation after starting HD treatment. Roughly, 300cc fluid removed. Currently fluid removal is stopped. Pt did receive 12.5g Albumin per HD protocol. For BP of 74/51.  Initial Focused Assessment:  Pt lying in bed, looking around. He is oriented to person. No distress, however he intermittently shouts. He denies pain. Lung sounds with faint crackles, overshadowed by chest congestion. Pt with congested cough, nonproductive. Extremities are warm, dry, pink. Breathing is regular, unlabored.   Pt with bilateral upper extremity fistulas. BP being taken on pt's leg. Intermittent low BP reading in the 70s. Repeat BP 101/43. BP cuff moved to pt's thigh. Now with more consistent readings, SBP >100.   HR irregular, 110-140s bpm. Sustaining 110-130 bpm. EKG completed. Pt with no documented history of atrial fibrillation.  VS: T 98.36F, BP 105/95, HR 122, RR 20, SpO2 100% on 2LNC CBG: 96  Interventions:  -CBG -EKG  Plan of Care:  -Level of care upgraded to PCU, pending bed assignment  Call rapid response for additional needs  Event Summary:  MD Notified: Raelyn Mora - Dr. Marval Regal also rounded on patient during my time at the bedside Call Time: 1637 Arrival Time: 1640 End Time: [place], RN

## 2022-09-10 NOTE — Assessment & Plan Note (Signed)
BP actually on the softer side. He is on midodrine which we will continue.

## 2022-09-10 NOTE — Progress Notes (Signed)
Received patient in bed to unit.  Alert and oriented.  Informed consent signed and in chart.   Treatment initiated: 1527 Treatment completed: 1757  Patient tolerated well.  Transported back to the room  Alert, without acute distress.  Hand-off given to patient's nurse.   Access used: Fistula Access issues: none  Total UF removed: 100 Medication(s) given: albumin 25g Post HD VS: 97.5, 147/121(130), HR-133, RR-24, SP02-100 Post HD weight: unable to get    Pt didn't tolerate dialysis. BP dropped and had to get albumin. ED nurse gave midodrine at 11am because it was listed as a scheduled med. Pt didn't get to dialysis til after 1500. Unable to get accurate blood pressures. Rapid was called because I was concerned about BP which was registering low even after albumin and HR was 115-140's and pt was in afib with no history. Pt was elevated to PCU status and was still awaiting a bed when he was done so he was sent to the ED until placed.   [facility]

## 2022-09-10 NOTE — Assessment & Plan Note (Addendum)
-  HD MWF -Needs nephrology consult for scheduled dialysis

## 2022-09-10 NOTE — ED Notes (Signed)
Pt transported to dialysis

## 2022-09-10 NOTE — Progress Notes (Signed)
PROGRESS NOTE    [name]  ONG:[id number] DOB: 11-07-1940 DOA: 09/09/2022 PCP: Elby Showers, MD    Brief Narrative:  81 year old gentleman with history of Alzheimer's dementia, ESRD on hemodialysis, hypertension, type 2 diabetes, coronary artery disease and peripheral vascular disease recently discharged to a skilled nursing facility sent back to the emergency room with shortness of breath for past several days.  He was found to be 88% oxygenation on room air.  He was discharged to a SNF after hip fracture surgery on 12/7.  In the emergency room requiring 2 L oxygen.  Hemodynamically stable.  Found to have influenza positive.   Assessment & Plan:   Acute respiratory failure with hypoxemia secondary to influenza A pneumonia: Agree with admission given severity of symptoms. His CT scan of the chest showed possible posterior basal pneumonia, adding Rocephin and azithromycin. Renally dosed Tamiflu for 5 days. Chest physiotherapy, incentive spirometry, deep breathing exercises, sputum induction, mucolytic's and bronchodilators. Sputum cultures, blood cultures,  Supplemental oxygen to keep saturations more than 90%. Mobilize with PT OT.  Anticipate discharge back to SNF.  Chronic medical issues including ESRD on hemodialysis, due for dialysis today.  I notified nephrologist. Hypothyroidism, on levothyroxine.  Continue. Hypertension: Not on treatment.  Supported by midodrine.  Continue. Non-insulin-dependent type 2 diabetes: On sliding scale insulin.   DVT prophylaxis: heparin injection 5,000 Units Start: 09/10/22 1400   Code Status: DNR Family Communication: None at the bedside Disposition Plan: Status is: Inpatient Remains inpatient appropriate because: Significant shortness of breath, weakness and mobility issues.     Consultants:  Nephrology  Procedures:  None  Antimicrobials:  Rocephin and azithromycin, Tamiflu 12/17----   Subjective: Patient seen and  examined.  Pleasant but unable to say much.  On 1 L oxygen.  Has some dry cough.  Afebrile overnight.  Objective: Vitals:   09/10/22 0830 09/10/22 0845 09/10/22 1000 09/10/22 1015  BP: (!) 162/78 (!) 156/79 (!) 145/70 122/66  Pulse: 78 79 77 (!) 143  Resp: 16 (!) '21 19 19  '$ Temp:      TempSrc:      SpO2: 100% 100%    Weight:      Height:        Intake/Output Summary (Last 24 hours) at 09/10/2022 1141 Last data filed at 09/10/2022 0421 Gross per 24 hour  Intake 348.08 ml  Output --  Net 348.08 ml   Filed Weights   09/09/22 2106  Weight: 70 kg    Examination:  General exam: Appears calm and comfortable  Frail and debilitated.  Chronically sick looking.  Not in any distress. Respiratory system: Clear to auscultation. Respiratory effort normal.  No added sounds. Cardiovascular system: S1 & S2 heard, RRR.  Gastrointestinal system: Abdomen is nondistended, soft and nontender. No organomegaly or masses felt. Normal bowel sounds heard. Central nervous system: Alert and awake.  Oriented to self only. AV fistula on both arms.    Data Reviewed: I have personally reviewed following labs and imaging studies  CBC: Recent Labs  Lab 09/09/22 1901 09/10/22 0517  WBC 13.7* 14.1*  NEUTROABS 10.8*  --   HGB 8.6* 7.9*  HCT 26.3* 24.0*  MCV 104.8* 103.9*  PLT 221 440   Basic Metabolic Panel: Recent Labs  Lab 09/09/22 1901 09/10/22 0517  NA 141 141  K 3.5 3.6  CL 97* 96*  CO2 28 30  GLUCOSE 126* 110*  BUN 50* 58*  CREATININE 8.11* 8.85*  CALCIUM 9.7 9.7   GFR: Estimated  Creatinine Clearance: 6.3 mL/min (A) (by C-G formula based on SCr of 8.85 mg/dL (H)). Liver Function Tests: No results for input(s): "AST", "ALT", "ALKPHOS", "BILITOT", "PROT", "ALBUMIN" in the last 168 hours. No results for input(s): "LIPASE", "AMYLASE" in the last 168 hours. No results for input(s): "AMMONIA" in the last 168 hours. Coagulation Profile: No results for input(s): "INR", "PROTIME" in  the last 168 hours. Cardiac Enzymes: No results for input(s): "CKTOTAL", "CKMB", "CKMBINDEX", "TROPONINI" in the last 168 hours. BNP (last 3 results) No results for input(s): "PROBNP" in the last 8760 hours. HbA1C: No results for input(s): "HGBA1C" in the last 72 hours. CBG: Recent Labs  Lab 09/09/22 1851 09/10/22 0820 09/10/22 1125  GLUCAP 115* 127* 106*   Lipid Profile: No results for input(s): "CHOL", "HDL", "LDLCALC", "TRIG", "CHOLHDL", "LDLDIRECT" in the last 72 hours. Thyroid Function Tests: No results for input(s): "TSH", "T4TOTAL", "FREET4", "T3FREE", "THYROIDAB" in the last 72 hours. Anemia Panel: No results for input(s): "VITAMINB12", "FOLATE", "FERRITIN", "TIBC", "IRON", "RETICCTPCT" in the last 72 hours. Sepsis Labs: Recent Labs  Lab 09/09/22 2146 09/09/22 2319 09/09/22 2344  PROCALCITON  --   --  2.95  LATICACIDVEN 1.1 1.3  --     Recent Results (from the past 240 hour(s))  Resp panel by RT-PCR (RSV, Flu A&B, Covid) Anterior Nasal Swab     Status: Abnormal   Collection Time: 09/09/22  8:27 PM   Specimen: Anterior Nasal Swab  Result Value Ref Range Status   SARS Coronavirus 2 by RT PCR NEGATIVE NEGATIVE Final    Comment: (NOTE) SARS-CoV-2 target nucleic acids are NOT DETECTED.  The SARS-CoV-2 RNA is generally detectable in upper respiratory specimens during the acute phase of infection. The lowest concentration of SARS-CoV-2 viral copies this assay can detect is 138 copies/mL. A negative result does not preclude SARS-Cov-2 infection and should not be used as the sole basis for treatment or other patient management decisions. A negative result may occur with  improper specimen collection/handling, submission of specimen other than nasopharyngeal swab, presence of viral mutation(s) within the areas targeted by this assay, and inadequate number of viral copies(<138 copies/mL). A negative result must be combined with clinical observations, patient history,  and epidemiological information. The expected result is Negative.  Fact Sheet for Patients:  EntrepreneurPulse.com.au  Fact Sheet for Healthcare Providers:  IncredibleEmployment.be  This test is no t yet approved or cleared by the [place] FDA and  has been authorized for detection and/or diagnosis of SARS-CoV-2 by FDA under an Emergency Use Authorization (EUA). This EUA will remain  in effect (meaning this test can be used) for the duration of the COVID-19 declaration under Section 564(b)(1) of the Act, 21 U.S.C.section 360bbb-3(b)(1), unless the authorization is terminated  or revoked sooner.       Influenza A by PCR POSITIVE (A) NEGATIVE Final   Influenza B by PCR NEGATIVE NEGATIVE Final    Comment: (NOTE) The Xpert Xpress SARS-CoV-2/FLU/RSV plus assay is intended as an aid in the diagnosis of influenza from Nasopharyngeal swab specimens and should not be used as a sole basis for treatment. Nasal washings and aspirates are unacceptable for Xpert Xpress SARS-CoV-2/FLU/RSV testing.  Fact Sheet for Patients: EntrepreneurPulse.com.au  Fact Sheet for Healthcare Providers: IncredibleEmployment.be  This test is not yet approved or cleared by the [place] FDA and has been authorized for detection and/or diagnosis of SARS-CoV-2 by FDA under an Emergency Use Authorization (EUA). This EUA will remain in effect (meaning this test can be used)  for the duration of the COVID-19 declaration under Section 564(b)(1) of the Act, 21 U.S.C. section 360bbb-3(b)(1), unless the authorization is terminated or revoked.     Resp Syncytial Virus by PCR NEGATIVE NEGATIVE Final    Comment: (NOTE) Fact Sheet for Patients: EntrepreneurPulse.com.au  Fact Sheet for Healthcare Providers: IncredibleEmployment.be  This test is not yet approved or cleared by the [place] FDA  and has been authorized for detection and/or diagnosis of SARS-CoV-2 by FDA under an Emergency Use Authorization (EUA). This EUA will remain in effect (meaning this test can be used) for the duration of the COVID-19 declaration under Section 564(b)(1) of the Act, 21 U.S.C. section 360bbb-3(b)(1), unless the authorization is terminated or revoked.  Performed at [facility], [place] [address], [place], [place] [address]   Culture, blood (routine x 2)     Status: None (Preliminary result)   Collection Time: 09/09/22  9:19 PM   Specimen: BLOOD  Result Value Ref Range Status   Specimen Description BLOOD RIGHT ANTECUBITAL  Final   Special Requests   Final    BOTTLES DRAWN AEROBIC AND ANAEROBIC Blood Culture adequate volume   Culture   Final    NO GROWTH < 12 HOURS Performed at [facility], [place] [address] [place], [place] [address]    Report Status PENDING  Incomplete  Culture, blood (routine x 2)     Status: None (Preliminary result)   Collection Time: 09/09/22  9:24 PM   Specimen: BLOOD  Result Value Ref Range Status   Specimen Description BLOOD BLOOD RIGHT HAND  Final   Special Requests   Final    BOTTLES DRAWN AEROBIC AND ANAEROBIC Blood Culture adequate volume   Culture   Final    NO GROWTH < 12 HOURS Performed at [facility], [place] [address], [place], [place] [address]    Report Status PENDING  Incomplete         Radiology Studies: CT Angio Chest PE W and/or Wo Contrast  Result Date: 09/09/2022 CLINICAL DATA:  Shortness of breath for multiple days, with abdominal pain today. Hypoxic oxygen saturation 88%. Pulmonary embolism suspected. High probability. EXAM: CT ANGIOGRAPHY CHEST CT ABDOMEN AND PELVIS WITH CONTRAST TECHNIQUE: Multidetector CT imaging of the chest was performed using the standard protocol during bolus administration of intravenous contrast. Multiplanar CT image reconstructions and MIPs were obtained to evaluate the vascular anatomy.  Multidetector CT imaging of the abdomen and pelvis was performed using the standard protocol during bolus administration of intravenous contrast. RADIATION DOSE REDUCTION: This exam was performed according to the departmental dose-optimization program which includes automated exposure control, adjustment of the mA and/or kV according to patient size and/or use of iterative reconstruction technique. CONTRAST:  43m OMNIPAQUE IOHEXOL 350 MG/ML SOLN COMPARISON:  Portable chest today, chest x-ray 08/17/2022, CTA chest 06/06/2022, CT thoracic and lumbar spine 08/25/2022, CT abdomen and pelvis no contrast 08/17/2022 and 04/09/2022, CTA abdomen and pelvis 06/06/2022. FINDINGS: CTA CHEST FINDINGS Cardiovascular: The pulmonary arteries are normal in caliber without evidence of thromboemboli. The cardiac size is normal. There is three-vessel coronary artery calcification. Thoracic aorta is heavily calcified with additional patchy moderate to heavy calcification in the great vessels. There is no aortic or great vessel flow-limiting stenosis, aneurysm or dissection. The pulmonary veins are decompressed. Mediastinum/Nodes: There are calcified mediastinal and right hilar lymph nodes. There are subcentimeter in short axis right paratracheal noncalcified lymph nodes and slightly prominent subcarinal lymph nodes again measuring up to 1.3  cm in short axis, stable. No further intrathoracic adenopathy is seen. Axillary spaces are clear. The thoracic trachea and thoracic esophagus are unremarkable. Thyroid is unremarkable. Lungs/Pleura: Previously there were small pleural effusions which have resolved since. The lungs are mildly emphysematous with mild paraseptal and centrilobular emphysematous changes in the upper lobes. There is new demonstration of a pleural-based 6 mm nodule in the posterior left upper lobe apex on 4:37. There is a stable 6 mm lingular nodule which has not changed in appearance since abdominopelvic CT 11/19/2018.  There is continued bronchial thickening in the lower lobes. On the left, there is a small posterior basal consolidation merging with linear atelectatic bands above it, findings which could be due to pneumonia or atelectasis. Lungs are otherwise clear.  The main bronchi are patent. Musculoskeletal: There is a moderate chronic bow tie compression deformity of the C7 vertebral body, mild upper plate anterior wedging chronically T1, 3, 8 and 12 with no new or worsening compression injury. Generalized osteopenia. There is no displaced rib fracture. Mild right-sided subareolar gynecomastia. Review of the MIP images confirms the above findings. CT ABDOMEN and PELVIS FINDINGS Hepatobiliary: No significant liver abnormality is seen. No gallstones, gallbladder wall thickening, or biliary dilatation. There are stable scattered subcentimeter hepatic hypodensities most likely small cysts. Pancreas: No abnormality. Spleen: No abnormality. Adrenals/Urinary Tract: No adrenal mass. There is diffuse bilateral end-stage renal atrophy with numerous bilateral cysts. No solid lesion is suspected with some of the hypodensities in both kidneys are too small to adequately characterize. There is no hydronephrosis or stone. The bladder is contracted and not well seen, again noted somewhat thickened and indistinct but there may be increased perivesical stranding which could indicate cystitis. Stomach/Bowel: No dilatation or wall thickening. Uncomplicated sigmoid diverticulosis. An appendix is not seen in this patient. Vascular/Lymphatic: Extensive aortoiliac and branch vessel atherosclerotic versus with patent aortobifem bypass graft. 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 No lymphadenopathy is seen. Reproductive: No prostatomegaly. Other: Surgical changes broad-based abdominal wall repair. No incarcerated hernia. Trace presacral ascites is similar to prior studies. No free air, free hemorrhage or abscess. Musculoskeletal:  Osteopenia. Severe chronic L1 compression fracture again noted, treated L3 compression fracture with kyphoplasty cement extruded into the L2-3 disc space, with moderate chronic wedging of L2-5. Bilateral hip nailing hardware creates streak artifact, with left hip intertrochanteric fracture first seen on 08/25/2022. Fracture fixation alignment approximates anatomic. Review of the MIP images confirms the above findings. IMPRESSION: 1. No evidence of pulmonary arterial dilatation or embolus. 2. Aortic and coronary artery atherosclerosis.  Normal cardiac size. 3. Small posterior basal left lower lobe consolidation versus atelectasis. 4. 6 mm pleural-based nodule in the posterior left upper lobe apex, new from 06/06/2022. Follow-up CT 6-12 months recommended per Fleischner guidelines. 5. Stable 6 mm lingular nodule  presumably benign. 6. Chronic bronchial thickening in the lower lobes. 7. Stable mildly prominent subcarinal lymph node. 8. Chronic end-stage renal atrophy with numerous cysts. No hydronephrosis or stone. 9. Possible cystitis. 10. Osteopenia and degenerative changes with multiple chronic vertebral body compression fractures. No new fracture is seen. 11. Extensive aortoiliac and branch vessel atherosclerosis with patent aortobifem bypass graft. 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 12. Bilateral hip nailing hardware with left hip intertrochanteric fracture first seen on 08/25/2022. Fracture fixation alignment approximates anatomic. 13. Emphysema. Emphysema (ICD10-J43.9). Electronically Signed   By: Telford Nab M.D.   On: 09/09/2022 23:31   CT ABDOMEN PELVIS W CONTRAST  Result Date: 09/09/2022 CLINICAL  DATA:  Shortness of breath for multiple days, with abdominal pain today. Hypoxic oxygen saturation 88%. Pulmonary embolism suspected. High probability. EXAM: CT ANGIOGRAPHY CHEST CT ABDOMEN AND PELVIS WITH CONTRAST TECHNIQUE: Multidetector CT imaging of the chest was performed using the  standard protocol during bolus administration of intravenous contrast. Multiplanar CT image reconstructions and MIPs were obtained to evaluate the vascular anatomy. Multidetector CT imaging of the abdomen and pelvis was performed using the standard protocol during bolus administration of intravenous contrast. RADIATION DOSE REDUCTION: This exam was performed according to the departmental dose-optimization program which includes automated exposure control, adjustment of the mA and/or kV according to patient size and/or use of iterative reconstruction technique. CONTRAST:  70m OMNIPAQUE IOHEXOL 350 MG/ML SOLN COMPARISON:  Portable chest today, chest x-ray 08/17/2022, CTA chest 06/06/2022, CT thoracic and lumbar spine 08/25/2022, CT abdomen and pelvis no contrast 08/17/2022 and 04/09/2022, CTA abdomen and pelvis 06/06/2022. FINDINGS: CTA CHEST FINDINGS Cardiovascular: The pulmonary arteries are normal in caliber without evidence of thromboemboli. The cardiac size is normal. There is three-vessel coronary artery calcification. Thoracic aorta is heavily calcified with additional patchy moderate to heavy calcification in the great vessels. There is no aortic or great vessel flow-limiting stenosis, aneurysm or dissection. The pulmonary veins are decompressed. Mediastinum/Nodes: There are calcified mediastinal and right hilar lymph nodes. There are subcentimeter in short axis right paratracheal noncalcified lymph nodes and slightly prominent subcarinal lymph nodes again measuring up to 1.3 cm in short axis, stable. No further intrathoracic adenopathy is seen. Axillary spaces are clear. The thoracic trachea and thoracic esophagus are unremarkable. Thyroid is unremarkable. Lungs/Pleura: Previously there were small pleural effusions which have resolved since. The lungs are mildly emphysematous with mild paraseptal and centrilobular emphysematous changes in the upper lobes. There is new demonstration of a pleural-based 6 mm  nodule in the posterior left upper lobe apex on 4:37. There is a stable 6 mm lingular nodule which has not changed in appearance since abdominopelvic CT 11/19/2018. There is continued bronchial thickening in the lower lobes. On the left, there is a small posterior basal consolidation merging with linear atelectatic bands above it, findings which could be due to pneumonia or atelectasis. Lungs are otherwise clear.  The main bronchi are patent. Musculoskeletal: There is a moderate chronic bow tie compression deformity of the C7 vertebral body, mild upper plate anterior wedging chronically T1, 3, 8 and 12 with no new or worsening compression injury. Generalized osteopenia. There is no displaced rib fracture. Mild right-sided subareolar gynecomastia. Review of the MIP images confirms the above findings. CT ABDOMEN and PELVIS FINDINGS Hepatobiliary: No significant liver abnormality is seen. No gallstones, gallbladder wall thickening, or biliary dilatation. There are stable scattered subcentimeter hepatic hypodensities most likely small cysts. Pancreas: No abnormality. Spleen: No abnormality. Adrenals/Urinary Tract: No adrenal mass. There is diffuse bilateral end-stage renal atrophy with numerous bilateral cysts. No solid lesion is suspected with some of the hypodensities in both kidneys are too small to adequately characterize. There is no hydronephrosis or stone. The bladder is contracted and not well seen, again noted somewhat thickened and indistinct but there may be increased perivesical stranding which could indicate cystitis. Stomach/Bowel: No dilatation or wall thickening. Uncomplicated sigmoid diverticulosis. An appendix is not seen in this patient. Vascular/Lymphatic: Extensive aortoiliac and branch vessel atherosclerotic versus with patent aortobifem bypass graft. 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 No lymphadenopathy is seen. Reproductive: No prostatomegaly. Other: Surgical changes  broad-based abdominal wall repair. No incarcerated hernia. Trace presacral  ascites is similar to prior studies. No free air, free hemorrhage or abscess. Musculoskeletal: Osteopenia. Severe chronic L1 compression fracture again noted, treated L3 compression fracture with kyphoplasty cement extruded into the L2-3 disc space, with moderate chronic wedging of L2-5. Bilateral hip nailing hardware creates streak artifact, with left hip intertrochanteric fracture first seen on 08/25/2022. Fracture fixation alignment approximates anatomic. Review of the MIP images confirms the above findings. IMPRESSION: 1. No evidence of pulmonary arterial dilatation or embolus. 2. Aortic and coronary artery atherosclerosis.  Normal cardiac size. 3. Small posterior basal left lower lobe consolidation versus atelectasis. 4. 6 mm pleural-based nodule in the posterior left upper lobe apex, new from 06/06/2022. Follow-up CT 6-12 months recommended per Fleischner guidelines. 5. Stable 6 mm lingular nodule  presumably benign. 6. Chronic bronchial thickening in the lower lobes. 7. Stable mildly prominent subcarinal lymph node. 8. Chronic end-stage renal atrophy with numerous cysts. No hydronephrosis or stone. 9. Possible cystitis. 10. Osteopenia and degenerative changes with multiple chronic vertebral body compression fractures. No new fracture is seen. 11. Extensive aortoiliac and branch vessel atherosclerosis with patent aortobifem bypass graft. 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 12. Bilateral hip nailing hardware with left hip intertrochanteric fracture first seen on 08/25/2022. Fracture fixation alignment approximates anatomic. 13. Emphysema. Emphysema (ICD10-J43.9). Electronically Signed   By: Telford Nab M.D.   On: 09/09/2022 23:31   DG Chest Port 1 View  Result Date: 09/09/2022 CLINICAL DATA:  Shortness of breath EXAM: PORTABLE CHEST 1 VIEW COMPARISON:  Chest x-ray 08/17/2022 FINDINGS: There is a band of  opacity in the left lung base. The lungs are otherwise clear. No pleural effusion or pneumothorax. There are severe atherosclerotic calcifications of the aorta. Cardiomediastinal silhouette is within normal limits. No acute fractures. IMPRESSION: Band of opacity in the left lung base, atelectasis versus infection. Electronically Signed   By: Ronney Asters M.D.   On: 09/09/2022 19:26        Scheduled Meds:  Chlorhexidine Gluconate Cloth  6 each Topical Q0600   darbepoetin (ARANESP) injection - DIALYSIS  100 mcg Subcutaneous Q Mon-1800   heparin  5,000 Units Subcutaneous Q8H   insulin aspart  0-9 Units Subcutaneous TID WC   midodrine  10 mg Oral Q M,W,F   [START ON 09/11/2022] oseltamivir  30 mg Oral Q T,Th,Sa-HD   Continuous Infusions:   LOS: 1 day    Time spent: 35 minutes    Barb Merino, MD [facility] Pager [phone]

## 2022-09-10 NOTE — Care Management (Signed)
Daughter currently at hospital. Updated grand daughter . Son [name] can be called. Will monitor and expect to go back to facility tomorrow

## 2022-09-10 NOTE — ED Notes (Signed)
Transport called.

## 2022-09-10 NOTE — Procedures (Signed)
I was present at this dialysis session. I have reviewed the session itself and made appropriate changes.   Vital signs in last 24 hours:  Temp:  [99.4 F (37.4 C)-100.3 F (37.9 C)] 99.4 F (37.4 C) (12/18 0002) Pulse Rate:  [59-143] 73 (12/18 1430) Resp:  [8-28] 18 (12/18 1430) BP: (92-169)/(45-143) 135/62 (12/18 1430) SpO2:  [50 %-100 %] 100 % (12/18 1430) Weight:  [70 kg] 70 kg (12/17 2106) Weight change:  Filed Weights   09/09/22 2106  Weight: 70 kg    Recent Labs  Lab 09/10/22 0517  NA 141  K 3.6  CL 96*  CO2 30  GLUCOSE 110*  BUN 58*  CREATININE 8.85*  CALCIUM 9.7    Recent Labs  Lab 09/09/22 1901 09/10/22 0517  WBC 13.7* 14.1*  NEUTROABS 10.8*  --   HGB 8.6* 7.9*  HCT 26.3* 24.0*  MCV 104.8* 103.9*  PLT 221 206    Scheduled Meds:  Chlorhexidine Gluconate Cloth  6 each Topical Q0600   darbepoetin (ARANESP) injection - DIALYSIS  100 mcg Subcutaneous Q Mon-1800   heparin  5,000 Units Subcutaneous Q8H   insulin aspart  0-9 Units Subcutaneous TID WC   midodrine  10 mg Oral Q M,W,F   [START ON 09/11/2022] oseltamivir  30 mg Oral Q T,Th,Sa-HD   Continuous Infusions: PRN Meds:.lidocaine (PF), lidocaine-prilocaine, pentafluoroprop-tetrafluoroeth   Donetta Potts,  MD 09/10/2022, 3:02 PM

## 2022-09-10 NOTE — Assessment & Plan Note (Signed)
-  secondary to influenza A pneumonia although he was started on IV Rocephin and Azithromycin in the ED after CTA chest showed possible posterior basal pneumonia. Will check procalcitonin -Tamflu '30mg'$  every other day for renal dosing  -goal O2 >92%. Currently requiring 2L.

## 2022-09-10 NOTE — Assessment & Plan Note (Addendum)
-  pt alert and oriented to self at baseline

## 2022-09-10 NOTE — Assessment & Plan Note (Signed)
-  continue levothyoxine

## 2022-09-10 NOTE — ED Notes (Signed)
Called wife, Jadier Rockers [phone], to get consent for pt's hemodialysis. Wife gave verbal consent over the phone, Musician present as second witness to verbal consent over the phone.

## 2022-09-10 NOTE — Progress Notes (Signed)
Consult for PIV start. RUA AV graft, LUA AV graft. Unable to attempt to place PIV in either arm with functional AVG. Notified Futures trader to contact RENAL MD for order to use either arm. Awaiting pending orders.

## 2022-09-10 NOTE — Assessment & Plan Note (Signed)
-  most recent A1C of 6.5. Keep on sensitive SSI

## 2022-09-10 NOTE — Consult Note (Signed)
[facility] Renal Consultation Note    Indication for Consultation:  Management of ESRD/hemodialysis, anemia, hypertension/volume, and secondary hyperparathyroidism.  HPI: [name] is a 81 y.o. male with PMH including ESRD on dialysis, dementia, T2DM, HTN, and hypothyroidism. Recent admission 08/25/22-08/30/22 for L hip arthoplasty after a fall, was discharged to SNF. Presented back to ED from SNF with SOB and hypoxia with new O2 requirement. Was found to be influenza A positive. Labs notable for K+ 3.6, BUN 58, Cr 8.85, Ca 9.7, WBC 14.1, Hgb 7.9, Plt 206. CT chest with emphysema and some pulmonary nodules. Nephrology consulted for management of ESRD.  On exam, patient reports "everything" is wrong. He endorses shortness of breath, cough, fever, chills, fatigue and diarrhea for several days. Denies CP, palpitations, dizziness, nausea, and vomiting. Well known to me from outpatient HD and mental status seems to be at baseline. He did have a short treatment 09/07/22 but otherwise has been compliant with dialysis. Left 2kg under his EDW last HD.   Past Medical History:  Diagnosis Date   Allergy    Alzheimer's disease ([place]) 01/19/2021   Anemia    Arthritis    Cataract    bil cateracts removed   Coronary artery disease    Diabetes mellitus    Type 2   Diverticulitis    ED (erectile dysfunction)    Elevated homocysteine    ESRD (end stage renal disease) on dialysis ([place]) 03/2015   M-W-F dialysis   GERD (gastroesophageal reflux disease)    pepto    Gout    Hiatal hernia    Hyperlipidemia    Hypertension    Hypothyroidism    PVD (peripheral vascular disease) ([place])    has plastic aorta   Renal insufficiency    Sleep apnea    does not wear c-pap   Past Surgical History:  Procedure Laterality Date   ANGIOPLASTY Right 01/26/2021   Procedure: ANGIOPLASTY RIGHT SUBCLAVIAN VEIN;  Surgeon: Serafina Mitchell, MD;  Location: [place];  Service: Vascular;  Laterality: Right;    aortobifemoral bypass     AV FISTULA PLACEMENT Left 12/01/2013   Procedure: ARTERIOVENOUS (AV) FISTULA CREATION- LEFT BRACHIOCEPHALIC;  Surgeon: Angelia Mould, MD;  Location: [place];  Service: Vascular;  Laterality: Left;   BASCILIC VEIN TRANSPOSITION Right 07/27/2014   Procedure: BASCILIC VEIN TRANSPOSITION;  Surgeon: Angelia Mould, MD;  Location: [place];  Service: Vascular;  Laterality: Right;   BRAIN SURGERY  07/22/2018   BREAST SURGERY     left - granulomatous mastitis   BURR HOLE Bilateral 07/22/2018   Procedure: BILATERAL BURR HOLES;  Surgeon: Kristeen Miss, MD;  Location: [place];  Service: Neurosurgery;  Laterality: Bilateral;   COLONOSCOPY     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 ([facility])  1992   ENTEROSCOPY N/A 06/03/2018   Procedure: ENTEROSCOPY;  Surgeon: Lavena Bullion, DO;  Location: [facility];  Service: Gastroenterology;  Laterality: N/A;   ENTEROSCOPY N/A 11/20/2018   Procedure: ENTEROSCOPY;  Surgeon: Rush Landmark Telford Nab., MD;  Location: [place];  Service: Gastroenterology;  Laterality: N/A;   ENTEROSCOPY N/A 12/03/2019   Procedure: ENTEROSCOPY;  Surgeon: Lavena Bullion, DO;  Location: [facility];  Service: Gastroenterology;  Laterality: N/A;  push enteroscopy   ENTEROSCOPY N/A 07/04/2021   Procedure: ENTEROSCOPY;  Surgeon: Sharyn Creamer, MD;  Location: [facility] ENDOSCOPY;  Service: Gastroenterology;  Laterality: N/A;   ENTEROSCOPY N/A 11/05/2021   Procedure: ENTEROSCOPY;  Surgeon: Daryel November, MD;  Location: [facility];  Service: Gastroenterology;  Laterality: N/A;   EYE SURGERY Bilateral    cataracts   FISTULOGRAM Right 01/26/2021   Procedure: FISTULOGRAM RIGHT;  Surgeon: Serafina Mitchell, MD;  Location: [facility];  Service: Vascular;  Laterality: Right;   HEMODIALYSIS INPATIENT  01/17/2018       HOT HEMOSTASIS N/A 06/03/2018   Procedure: HOT HEMOSTASIS (ARGON PLASMA COAGULATION/BICAP);  Surgeon: Lavena Bullion, DO;  Location: [facility] ENDOSCOPY;   Service: Gastroenterology;  Laterality: N/A;   HOT HEMOSTASIS N/A 11/20/2018   Procedure: HOT HEMOSTASIS (ARGON PLASMA COAGULATION/BICAP);  Surgeon: Irving Copas., MD;  Location: [place];  Service: Gastroenterology;  Laterality: N/A;   HOT HEMOSTASIS N/A 12/03/2019   Procedure: HOT HEMOSTASIS (ARGON PLASMA COAGULATION/BICAP);  Surgeon: Lavena Bullion, DO;  Location: [facility];  Service: Gastroenterology;  Laterality: N/A;   INTRAMEDULLARY (IM) NAIL INTERTROCHANTERIC Right 11/08/2020   Procedure: INTRAMEDULLARY (IM) NAIL INTERTROCHANTRIC;  Surgeon: Erle Crocker, MD;  Location: [place];  Service: Orthopedics;  Laterality: Right;   INTRAMEDULLARY (IM) NAIL INTERTROCHANTERIC Left 08/26/2022   Procedure: INTRAMEDULLARY (IM) NAIL INTERTROCHANTERIC;  Surgeon: Georgeanna Harrison, MD;  Location: [place];  Service: Orthopedics;  Laterality: Left;   REVISION OF ARTERIOVENOUS GORETEX GRAFT Right 09/01/2019   Procedure: REVISION OF ARTERIOVENOUS FISTULA RIGHT ARM;  Surgeon: Serafina Mitchell, MD;  Location: [place];  Service: Vascular;  Laterality: Right;   REVISON OF ARTERIOVENOUS FISTULA Left 02/09/2014   Procedure: REVISON OF LEFT ARTERIOVENOUS FISTULA - RESECTION OF RENDUNDANT VEIN;  Surgeon: Angelia Mould, MD;  Location: [place];  Service: Vascular;  Laterality: Left;   REVISON OF ARTERIOVENOUS FISTULA Right 01/26/2021   Procedure: REVISON OF ARTERIOVENOUS FISTULA RIGHT;  Surgeon: Serafina Mitchell, MD;  Location: [place];  Service: Vascular;  Laterality: Right;   SBO with lysis adhesions     SHUNTOGRAM Left 04/19/2014   Procedure: FISTULOGRAM;  Surgeon: Angelia Mould, MD;  Location: [facility] CATH LAB;  Service: Cardiovascular;  Laterality: Left;   UNILATERAL UPPER EXTREMEITY ANGIOGRAM N/A 07/12/2014   Procedure: UNILATERAL UPPER Anselmo Rod;  Surgeon: Angelia Mould, MD;  Location: [facility] CATH LAB;  Service: Cardiovascular;  Laterality: N/A;   Family History  Problem  Relation Age of Onset   Aneurysm Mother        brain   Heart disease Father    Stroke Father    Hypertension Father    Diabetes Father    Dementia Neg Hx    Colon cancer Neg Hx    Esophageal cancer Neg Hx    Pancreatic cancer Neg Hx    Prostate cancer Neg Hx    Rectal cancer Neg Hx    Stomach cancer Neg Hx    Social History:  reports that he quit smoking about 27 years ago. His smoking use included cigarettes. He has never been exposed to tobacco smoke. He has never used smokeless tobacco. He reports that he does not drink alcohol and does not use drugs.  ROS: As per HPI otherwise negative.  Physical Exam: Vitals:   09/10/22 0700 09/10/22 0730 09/10/22 0830 09/10/22 0845  BP: (!) 148/127 (!) 151/63 (!) 162/78 (!) 156/79  Pulse: 74 75 78 79  Resp: '15 18 16 '$ (!) 21  Temp:      TempSrc:      SpO2: 100% 100% 100% 100%  Weight:      Height:         General: Well developed, well nourished, in no acute distress.  Head: Normocephalic, atraumatic, sclera non-icteric, mucus membranes are moist. Neck: JVD not elevated. Lungs: Clear bilaterally to auscultation without wheezes, rales, or rhonchi. Breathing is unlabored. Heart: RRR with normal S1, S2. No murmurs, rubs, or gallops appreciated. Abdomen: Soft, non-tender, non-distended with normoactive bowel sounds.  Lower extremities: No edema or ischemic changes, no open wounds. Neuro: Alert and oriented X 2. Moves all extremities spontaneously. Psych:  Responds to questions appropriately with a normal affect. Dialysis Access: RUE AVF + bruit  Allergies  Allergen Reactions   Ambien [Zolpidem Tartrate] Other (See Comments)    Hallucinations and "felt crazy". Confusion    Penicillins Hives, Swelling and Rash    Has patient had a PCN reaction causing immediate rash, facial/tongue/throat swelling, SOB or lightheadedness with hypotension: Yes Has patient had a PCN reaction causing severe rash involving mucus membranes or skin necrosis:  Yes Has patient had a PCN reaction that required hospitalization: No Has patient had a PCN reaction occurring within the last 10 years: No If all of the above answers are "NO", then may proceed with Cephalosporin use.    Prior to Admission medications   Medication Sig Start Date End Date Taking? Authorizing Provider  acetaminophen (TYLENOL) 325 MG tablet Take 1-2 tablets (325-650 mg total) by mouth every 4 (four) hours as needed for mild pain. Patient taking differently: Take 650 mg by mouth in the morning and at bedtime. 08/01/18   Love, Ivan Anchors, PA-C  albuterol (VENTOLIN HFA) 108 (90 Base) MCG/ACT inhaler Inhale 2 puffs into the lungs daily as needed for wheezing or shortness of breath.    [provider]  allopurinol (ZYLOPRIM) 100 MG tablet Take 100 mg by mouth daily.    [provider]  butalbital-acetaminophen-caffeine (FIORICET) 50-325-40 MG tablet Take 1 tablet by mouth daily as needed for headache.    [provider]  camphor-menthol Timoteo Ace) lotion Apply 1 application  topically in the morning and at bedtime.    [provider]  Carboxymethylcellulose Sodium (THERATEARS) 0.25 % SOLN Place 1 drop into both eyes as needed (for dryness).    [provider]  cetirizine (ZYRTEC) 10 MG tablet Take 10 mg by mouth daily as needed for allergies.    [provider]  cyanocobalamin (,VITAMIN B-12,) 1000 MCG/ML injection Inject 1 mL (1,000 mcg total) into the muscle every 30 (thirty) days. 04/13/22   Elby Showers, MD  donepezil (ARICEPT) 10 MG tablet Take 1 tablet (10 mg total) by mouth at bedtime. Patient not taking: Reported on 08/28/2022 09/26/21   Melvenia Beam, MD  doxercalciferol (HECTOROL) 4 MCG/2ML injection Inject 2.5 mLs (5 mcg total) into the vein every Monday, Wednesday, and Friday with hemodialysis. 06/18/22   Allie Bossier, MD  fluticasone (FLONASE) 50 MCG/ACT nasal spray Place 1 spray into both nostrils daily. Patient taking  differently: Place 1 spray into both nostrils daily as needed for allergies. 10/08/14   Mikhail, Velta Addison, DO  glucose blood (FREESTYLE LITE) test strip USE AS DIRECTED THREE TIMES DAILY 02/16/22   Elayne Snare, MD  hydrocortisone 2.5 % cream Apply 1 Application topically daily. 01/13/19   [provider]  lacosamide 100 MG TABS Take 1 tablet (100 mg total) by mouth 2 (two) times daily. 08/25/22   Thurnell Lose, MD  Lancets (FREESTYLE) lancets Use as instructed to check blood sugar 2 times per day dx code E11.65 02/22/16   Elayne Snare, MD  levothyroxine (SYNTHROID, LEVOTHROID) 50 MCG tablet Take 1 tablet (50 mcg total)  by mouth daily. 08/06/14   Elby Showers, MD  memantine (NAMENDA) 10 MG tablet Take 1 tablet (10 mg total) by mouth 2 (two) times daily. 09/26/21   Melvenia Beam, MD  midodrine (PROAMATINE) 10 MG tablet Take one pill prior to hemodialysis on MWF Patient taking differently: Take 10 mg by mouth See admin instructions. Take 10 mg by mouth prior to hemodialysis on Mon/Wed/Fri 08/13/18   Love, Ivan Anchors, PA-C  Nutritional Supplements (FEEDING SUPPLEMENT, NEPRO CARB STEADY,) LIQD Take 237 mLs by mouth in the morning and at bedtime.    [provider]  omeprazole (PRILOSEC) 40 MG capsule Take 1 capsule (40 mg total) by mouth daily. Patient taking differently: Take 40 mg by mouth daily before breakfast. 03/13/18   Esterwood, Amy S, PA-C  oxyCODONE-acetaminophen (PERCOCET/ROXICET) 5-325 MG tablet Take 1-2 tablets by mouth every 6 (six) hours as needed for moderate pain or severe pain (POSTOPERATIVE PAIN). 08/26/22   Georgeanna Harrison, MD  QUEtiapine (SEROQUEL) 25 MG tablet Take 1 tablet (25 mg total) by mouth at bedtime. 08/06/22   Elby Showers, MD  rosuvastatin (CRESTOR) 20 MG tablet Take 20 mg by mouth at bedtime.    [provider]  TRADJENTA 5 MG TABS tablet Take 1 tablet (5 mg total) by mouth daily. 11/16/21   Elayne Snare, MD   Current Facility-Administered  Medications  Medication Dose Route Frequency Provider Last Rate Last Admin   Chlorhexidine Gluconate Cloth 2 % PADS 6 each  6 each Topical Q0600 Donato Heinz, MD       heparin injection 5,000 Units  5,000 Units Subcutaneous Q8H Tu, Ching T, DO       insulin aspart (novoLOG) injection 0-9 Units  0-9 Units Subcutaneous TID WC Tu, Ching T, DO   1 Units at 09/10/22 0843   midodrine (PROAMATINE) tablet 10 mg  10 mg Oral Q M,W,F Tu, Ching T, DO       [START ON 09/11/2022] oseltamivir (TAMIFLU) capsule 30 mg  30 mg Oral Q T,Th,Sa-HD Tu, Ching T, DO       Current Outpatient Medications  Medication Sig Dispense Refill   acetaminophen (TYLENOL) 325 MG tablet Take 1-2 tablets (325-650 mg total) by mouth every 4 (four) hours as needed for mild pain. (Patient taking differently: Take 650 mg by mouth in the morning and at bedtime.)     albuterol (VENTOLIN HFA) 108 (90 Base) MCG/ACT inhaler Inhale 2 puffs into the lungs daily as needed for wheezing or shortness of breath.     allopurinol (ZYLOPRIM) 100 MG tablet Take 100 mg by mouth daily.     butalbital-acetaminophen-caffeine (FIORICET) 50-325-40 MG tablet Take 1 tablet by mouth daily as needed for headache.     camphor-menthol (SARNA) lotion Apply 1 application  topically in the morning and at bedtime.     Carboxymethylcellulose Sodium (THERATEARS) 0.25 % SOLN Place 1 drop into both eyes as needed (for dryness).     cetirizine (ZYRTEC) 10 MG tablet Take 10 mg by mouth daily as needed for allergies.     cyanocobalamin (,VITAMIN B-12,) 1000 MCG/ML injection Inject 1 mL (1,000 mcg total) into the muscle every 30 (thirty) days. 10 mL 0   donepezil (ARICEPT) 10 MG tablet Take 1 tablet (10 mg total) by mouth at bedtime. (Patient not taking: Reported on 08/28/2022) 90 tablet 0   doxercalciferol (HECTOROL) 4 MCG/2ML injection Inject 2.5 mLs (5 mcg total) into the vein every Monday, Wednesday, and Friday with hemodialysis. 2 mL  0   fluticasone (FLONASE) 50  MCG/ACT nasal spray Place 1 spray into both nostrils daily. (Patient taking differently: Place 1 spray into both nostrils daily as needed for allergies.) 16 g 2   glucose blood (FREESTYLE LITE) test strip USE AS DIRECTED THREE TIMES DAILY 300 strip 2   hydrocortisone 2.5 % cream Apply 1 Application topically daily.     lacosamide 100 MG TABS Take 1 tablet (100 mg total) by mouth 2 (two) times daily. 60 tablet 0   Lancets (FREESTYLE) lancets Use as instructed to check blood sugar 2 times per day dx code E11.65 100 each 3   levothyroxine (SYNTHROID, LEVOTHROID) 50 MCG tablet Take 1 tablet (50 mcg total) by mouth daily. 90 tablet 1   memantine (NAMENDA) 10 MG tablet Take 1 tablet (10 mg total) by mouth 2 (two) times daily. 180 tablet 0   midodrine (PROAMATINE) 10 MG tablet Take one pill prior to hemodialysis on MWF (Patient taking differently: Take 10 mg by mouth See admin instructions. Take 10 mg by mouth prior to hemodialysis on Mon/Wed/Fri)     Nutritional Supplements (FEEDING SUPPLEMENT, NEPRO CARB STEADY,) LIQD Take 237 mLs by mouth in the morning and at bedtime.     omeprazole (PRILOSEC) 40 MG capsule Take 1 capsule (40 mg total) by mouth daily. (Patient taking differently: Take 40 mg by mouth daily before breakfast.) 30 capsule 0   oxyCODONE-acetaminophen (PERCOCET/ROXICET) 5-325 MG tablet Take 1-2 tablets by mouth every 6 (six) hours as needed for moderate pain or severe pain (POSTOPERATIVE PAIN). 30 tablet 0   QUEtiapine (SEROQUEL) 25 MG tablet Take 1 tablet (25 mg total) by mouth at bedtime. 90 tablet 0   rosuvastatin (CRESTOR) 20 MG tablet Take 20 mg by mouth at bedtime.     TRADJENTA 5 MG TABS tablet Take 1 tablet (5 mg total) by mouth daily. 90 tablet 3   Labs: Basic Metabolic Panel: Recent Labs  Lab 09/09/22 1901 09/10/22 0517  NA 141 141  K 3.5 3.6  CL 97* 96*  CO2 28 30  GLUCOSE 126* 110*  BUN 50* 58*  CREATININE 8.11* 8.85*  CALCIUM 9.7 9.7   Liver Function Tests: No  results for input(s): "AST", "ALT", "ALKPHOS", "BILITOT", "PROT", "ALBUMIN" in the last 168 hours. No results for input(s): "LIPASE", "AMYLASE" in the last 168 hours. No results for input(s): "AMMONIA" in the last 168 hours. CBC: Recent Labs  Lab 09/09/22 1901 09/10/22 0517  WBC 13.7* 14.1*  NEUTROABS 10.8*  --   HGB 8.6* 7.9*  HCT 26.3* 24.0*  MCV 104.8* 103.9*  PLT 221 206   Cardiac Enzymes: No results for input(s): "CKTOTAL", "CKMB", "CKMBINDEX", "TROPONINI" in the last 168 hours. CBG: Recent Labs  Lab 09/09/22 1851 09/10/22 0820  GLUCAP 115* 127*   Iron Studies: No results for input(s): "IRON", "TIBC", "TRANSFERRIN", "FERRITIN" in the last 72 hours. Studies/Results: CT Angio Chest PE W and/or Wo Contrast  Result Date: 09/09/2022 CLINICAL DATA:  Shortness of breath for multiple days, with abdominal pain today. Hypoxic oxygen saturation 88%. Pulmonary embolism suspected. High probability. EXAM: CT ANGIOGRAPHY CHEST CT ABDOMEN AND PELVIS WITH CONTRAST TECHNIQUE: Multidetector CT imaging of the chest was performed using the standard protocol during bolus administration of intravenous contrast. Multiplanar CT image reconstructions and MIPs were obtained to evaluate the vascular anatomy. Multidetector CT imaging of the abdomen and pelvis was performed using the standard protocol during bolus administration of intravenous contrast. RADIATION DOSE REDUCTION: This exam was performed according to  the departmental dose-optimization program which includes automated exposure control, adjustment of the mA and/or kV according to patient size and/or use of iterative reconstruction technique. CONTRAST:  26m OMNIPAQUE IOHEXOL 350 MG/ML SOLN COMPARISON:  Portable chest today, chest x-ray 08/17/2022, CTA chest 06/06/2022, CT thoracic and lumbar spine 08/25/2022, CT abdomen and pelvis no contrast 08/17/2022 and 04/09/2022, CTA abdomen and pelvis 06/06/2022. FINDINGS: CTA CHEST FINDINGS Cardiovascular:  The pulmonary arteries are normal in caliber without evidence of thromboemboli. The cardiac size is normal. There is three-vessel coronary artery calcification. Thoracic aorta is heavily calcified with additional patchy moderate to heavy calcification in the great vessels. There is no aortic or great vessel flow-limiting stenosis, aneurysm or dissection. The pulmonary veins are decompressed. Mediastinum/Nodes: There are calcified mediastinal and right hilar lymph nodes. There are subcentimeter in short axis right paratracheal noncalcified lymph nodes and slightly prominent subcarinal lymph nodes again measuring up to 1.3 cm in short axis, stable. No further intrathoracic adenopathy is seen. Axillary spaces are clear. The thoracic trachea and thoracic esophagus are unremarkable. Thyroid is unremarkable. Lungs/Pleura: Previously there were small pleural effusions which have resolved since. The lungs are mildly emphysematous with mild paraseptal and centrilobular emphysematous changes in the upper lobes. There is new demonstration of a pleural-based 6 mm nodule in the posterior left upper lobe apex on 4:37. There is a stable 6 mm lingular nodule which has not changed in appearance since abdominopelvic CT 11/19/2018. There is continued bronchial thickening in the lower lobes. On the left, there is a small posterior basal consolidation merging with linear atelectatic bands above it, findings which could be due to pneumonia or atelectasis. Lungs are otherwise clear.  The main bronchi are patent. Musculoskeletal: There is a moderate chronic bow tie compression deformity of the C7 vertebral body, mild upper plate anterior wedging chronically T1, 3, 8 and 12 with no new or worsening compression injury. Generalized osteopenia. There is no displaced rib fracture. Mild right-sided subareolar gynecomastia. Review of the MIP images confirms the above findings. CT ABDOMEN and PELVIS FINDINGS Hepatobiliary: No significant liver  abnormality is seen. No gallstones, gallbladder wall thickening, or biliary dilatation. There are stable scattered subcentimeter hepatic hypodensities most likely small cysts. Pancreas: No abnormality. Spleen: No abnormality. Adrenals/Urinary Tract: No adrenal mass. There is diffuse bilateral end-stage renal atrophy with numerous bilateral cysts. No solid lesion is suspected with some of the hypodensities in both kidneys are too small to adequately characterize. There is no hydronephrosis or stone. The bladder is contracted and not well seen, again noted somewhat thickened and indistinct but there may be increased perivesical stranding which could indicate cystitis. Stomach/Bowel: No dilatation or wall thickening. Uncomplicated sigmoid diverticulosis. An appendix is not seen in this patient. Vascular/Lymphatic: Extensive aortoiliac and branch vessel atherosclerotic versus with patent aortobifem bypass graft. 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 No lymphadenopathy is seen. Reproductive: No prostatomegaly. Other: Surgical changes broad-based abdominal wall repair. No incarcerated hernia. Trace presacral ascites is similar to prior studies. No free air, free hemorrhage or abscess. Musculoskeletal: Osteopenia. Severe chronic L1 compression fracture again noted, treated L3 compression fracture with kyphoplasty cement extruded into the L2-3 disc space, with moderate chronic wedging of L2-5. Bilateral hip nailing hardware creates streak artifact, with left hip intertrochanteric fracture first seen on 08/25/2022. Fracture fixation alignment approximates anatomic. Review of the MIP images confirms the above findings. IMPRESSION: 1. No evidence of pulmonary arterial dilatation or embolus. 2. Aortic and coronary artery atherosclerosis.  Normal cardiac size. 3. Small  posterior basal left lower lobe consolidation versus atelectasis. 4. 6 mm pleural-based nodule in the posterior left upper lobe apex, new from  06/06/2022. Follow-up CT 6-12 months recommended per Fleischner guidelines. 5. Stable 6 mm lingular nodule  presumably benign. 6. Chronic bronchial thickening in the lower lobes. 7. Stable mildly prominent subcarinal lymph node. 8. Chronic end-stage renal atrophy with numerous cysts. No hydronephrosis or stone. 9. Possible cystitis. 10. Osteopenia and degenerative changes with multiple chronic vertebral body compression fractures. No new fracture is seen. 11. Extensive aortoiliac and branch vessel atherosclerosis with patent aortobifem bypass graft. 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 12. Bilateral hip nailing hardware with left hip intertrochanteric fracture first seen on 08/25/2022. Fracture fixation alignment approximates anatomic. 13. Emphysema. Emphysema (ICD10-J43.9). Electronically Signed   By: Telford Nab M.D.   On: 09/09/2022 23:31   CT ABDOMEN PELVIS W CONTRAST  Result Date: 09/09/2022 CLINICAL DATA:  Shortness of breath for multiple days, with abdominal pain today. Hypoxic oxygen saturation 88%. Pulmonary embolism suspected. High probability. EXAM: CT ANGIOGRAPHY CHEST CT ABDOMEN AND PELVIS WITH CONTRAST TECHNIQUE: Multidetector CT imaging of the chest was performed using the standard protocol during bolus administration of intravenous contrast. Multiplanar CT image reconstructions and MIPs were obtained to evaluate the vascular anatomy. Multidetector CT imaging of the abdomen and pelvis was performed using the standard protocol during bolus administration of intravenous contrast. RADIATION DOSE REDUCTION: This exam was performed according to the departmental dose-optimization program which includes automated exposure control, adjustment of the mA and/or kV according to patient size and/or use of iterative reconstruction technique. CONTRAST:  34m OMNIPAQUE IOHEXOL 350 MG/ML SOLN COMPARISON:  Portable chest today, chest x-ray 08/17/2022, CTA chest 06/06/2022, CT thoracic and  lumbar spine 08/25/2022, CT abdomen and pelvis no contrast 08/17/2022 and 04/09/2022, CTA abdomen and pelvis 06/06/2022. FINDINGS: CTA CHEST FINDINGS Cardiovascular: The pulmonary arteries are normal in caliber without evidence of thromboemboli. The cardiac size is normal. There is three-vessel coronary artery calcification. Thoracic aorta is heavily calcified with additional patchy moderate to heavy calcification in the great vessels. There is no aortic or great vessel flow-limiting stenosis, aneurysm or dissection. The pulmonary veins are decompressed. Mediastinum/Nodes: There are calcified mediastinal and right hilar lymph nodes. There are subcentimeter in short axis right paratracheal noncalcified lymph nodes and slightly prominent subcarinal lymph nodes again measuring up to 1.3 cm in short axis, stable. No further intrathoracic adenopathy is seen. Axillary spaces are clear. The thoracic trachea and thoracic esophagus are unremarkable. Thyroid is unremarkable. Lungs/Pleura: Previously there were small pleural effusions which have resolved since. The lungs are mildly emphysematous with mild paraseptal and centrilobular emphysematous changes in the upper lobes. There is new demonstration of a pleural-based 6 mm nodule in the posterior left upper lobe apex on 4:37. There is a stable 6 mm lingular nodule which has not changed in appearance since abdominopelvic CT 11/19/2018. There is continued bronchial thickening in the lower lobes. On the left, there is a small posterior basal consolidation merging with linear atelectatic bands above it, findings which could be due to pneumonia or atelectasis. Lungs are otherwise clear.  The main bronchi are patent. Musculoskeletal: There is a moderate chronic bow tie compression deformity of the C7 vertebral body, mild upper plate anterior wedging chronically T1, 3, 8 and 12 with no new or worsening compression injury. Generalized osteopenia. There is no displaced rib fracture.  Mild right-sided subareolar gynecomastia. Review of the MIP images confirms the above findings. CT ABDOMEN and PELVIS  FINDINGS Hepatobiliary: No significant liver abnormality is seen. No gallstones, gallbladder wall thickening, or biliary dilatation. There are stable scattered subcentimeter hepatic hypodensities most likely small cysts. Pancreas: No abnormality. Spleen: No abnormality. Adrenals/Urinary Tract: No adrenal mass. There is diffuse bilateral end-stage renal atrophy with numerous bilateral cysts. No solid lesion is suspected with some of the hypodensities in both kidneys are too small to adequately characterize. There is no hydronephrosis or stone. The bladder is contracted and not well seen, again noted somewhat thickened and indistinct but there may be increased perivesical stranding which could indicate cystitis. Stomach/Bowel: No dilatation or wall thickening. Uncomplicated sigmoid diverticulosis. An appendix is not seen in this patient. Vascular/Lymphatic: Extensive aortoiliac and branch vessel atherosclerotic versus with patent aortobifem bypass graft. 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 No lymphadenopathy is seen. Reproductive: No prostatomegaly. Other: Surgical changes broad-based abdominal wall repair. No incarcerated hernia. Trace presacral ascites is similar to prior studies. No free air, free hemorrhage or abscess. Musculoskeletal: Osteopenia. Severe chronic L1 compression fracture again noted, treated L3 compression fracture with kyphoplasty cement extruded into the L2-3 disc space, with moderate chronic wedging of L2-5. Bilateral hip nailing hardware creates streak artifact, with left hip intertrochanteric fracture first seen on 08/25/2022. Fracture fixation alignment approximates anatomic. Review of the MIP images confirms the above findings. IMPRESSION: 1. No evidence of pulmonary arterial dilatation or embolus. 2. Aortic and coronary artery atherosclerosis.  Normal  cardiac size. 3. Small posterior basal left lower lobe consolidation versus atelectasis. 4. 6 mm pleural-based nodule in the posterior left upper lobe apex, new from 06/06/2022. Follow-up CT 6-12 months recommended per Fleischner guidelines. 5. Stable 6 mm lingular nodule  presumably benign. 6. Chronic bronchial thickening in the lower lobes. 7. Stable mildly prominent subcarinal lymph node. 8. Chronic end-stage renal atrophy with numerous cysts. No hydronephrosis or stone. 9. Possible cystitis. 10. Osteopenia and degenerative changes with multiple chronic vertebral body compression fractures. No new fracture is seen. 11. Extensive aortoiliac and branch vessel atherosclerosis with patent aortobifem bypass graft. 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 12. Bilateral hip nailing hardware with left hip intertrochanteric fracture first seen on 08/25/2022. Fracture fixation alignment approximates anatomic. 13. Emphysema. Emphysema (ICD10-J43.9). Electronically Signed   By: Telford Nab M.D.   On: 09/09/2022 23:31   DG Chest Port 1 View  Result Date: 09/09/2022 CLINICAL DATA:  Shortness of breath EXAM: PORTABLE CHEST 1 VIEW COMPARISON:  Chest x-ray 08/17/2022 FINDINGS: There is a band of opacity in the left lung base. The lungs are otherwise clear. No pleural effusion or pneumothorax. There are severe atherosclerotic calcifications of the aorta. Cardiomediastinal silhouette is within normal limits. No acute fractures. IMPRESSION: Band of opacity in the left lung base, atelectasis versus infection. Electronically Signed   By: Ronney Asters M.D.   On: 09/09/2022 19:26    Dialysis Orders:  Center: [facility]  on MWF . 180NRe 3hr 52mn BFR 400 DFR Auto 1.5 EDW 64.5kg 2K 2Ca AVF 15g  No heparin Mircera 1567m IV q 2 weeks Hectorol 67m12mIV q HD  Assessment/Plan:  Influenza A: received tamiflu, management per admitting team  ESRD:  on HD MWF, continue current schedule.   Hypertension/volume: BP  variable in the ED. No volume overload on exam. Takes midodrine '10mg'$  pre-HD. Leaving below his outpatient EDW, has likely lost some weight and will need a new dry weight at discharge. UF with HD today as tolerated.   Anemia: Hgb 8.6 yesterday and 7.9 today. No  bleeding reported. Received IV iron during last admission. Will restart ESA, give a dose today.   Metabolic bone disease: Calcium elevated, will hold VDRA for now. Not on any binder, follow phos.   Nutrition:  renal diet/fluid restrictions. Albumin pending.  T2DM: insulin per admitting team Dementia: Mental status appears to be at baseline.   Anice Paganini, PA-C 09/10/2022, 9:15 AM  [facility] Pager: [phone]

## 2022-09-11 DIAGNOSIS — J101 Influenza due to other identified influenza virus with other respiratory manifestations: Secondary | ICD-10-CM

## 2022-09-11 DIAGNOSIS — Z992 Dependence on renal dialysis: Secondary | ICD-10-CM | POA: Diagnosis not present

## 2022-09-11 DIAGNOSIS — J9601 Acute respiratory failure with hypoxia: Secondary | ICD-10-CM | POA: Diagnosis not present

## 2022-09-11 DIAGNOSIS — N186 End stage renal disease: Secondary | ICD-10-CM | POA: Diagnosis not present

## 2022-09-11 LAB — GLUCOSE, CAPILLARY
Glucose-Capillary: 57 mg/dL — ABNORMAL LOW (ref 70–99)
Glucose-Capillary: 78 mg/dL (ref 70–99)
Glucose-Capillary: 88 mg/dL (ref 70–99)

## 2022-09-11 LAB — HEPATITIS B SURFACE ANTIBODY, QUANTITATIVE: Hep B S AB Quant (Post): 146.1 m[IU]/mL (ref >9.9)

## 2022-09-11 MED ORDER — OSELTAMIVIR PHOSPHATE 30 MG PO CAPS: 30.0000 mg | ORAL_CAPSULE | ORAL | Status: DC

## 2022-09-11 MED ORDER — DARBEPOETIN ALFA 100 MCG/0.5ML IJ SOSY
100.0000 ug | PREFILLED_SYRINGE | INTRAMUSCULAR | Status: DC
  Administered 2022-09-11: 100 ug via SUBCUTANEOUS
  Filled 2022-09-11: qty 0.5

## 2022-09-11 MED ORDER — OSELTAMIVIR PHOSPHATE 30 MG PO CAPS: 30.0000 mg | ORAL_CAPSULE | ORAL | Status: AC

## 2022-09-11 MED ORDER — OXYCODONE HCL 5 MG PO TABS: 5.0000 mg | ORAL_TABLET | Freq: Four times a day (QID) | ORAL | 0 refills | Status: AC | PRN

## 2022-09-11 MED ORDER — ACETAMINOPHEN 325 MG PO TABS: 650.0000 mg | ORAL_TABLET | Freq: Four times a day (QID) | ORAL | Status: AC | PRN

## 2022-09-11 MED ORDER — CHLORHEXIDINE GLUCONATE CLOTH 2 % EX PADS
6.0000 | MEDICATED_PAD | Freq: Every day | CUTANEOUS | Status: DC
  Administered 2022-09-11: 6 via TOPICAL

## 2022-09-11 MED ORDER — CEFDINIR 300 MG PO CAPS: 300.0000 mg | ORAL_CAPSULE | ORAL | Status: DC

## 2022-09-11 MED ORDER — PANTOPRAZOLE SODIUM 40 MG PO TBEC
40.0000 mg | DELAYED_RELEASE_TABLET | Freq: Every day | ORAL | Status: DC
  Administered 2022-09-11: 40 mg via ORAL
  Filled 2022-09-11: qty 1

## 2022-09-11 MED ORDER — OSELTAMIVIR PHOSPHATE 30 MG PO CAPS
30.0000 mg | ORAL_CAPSULE | Freq: Once | ORAL | Status: AC
  Filled 2022-09-11: qty 1

## 2022-09-11 MED ORDER — LEVOTHYROXINE SODIUM 50 MCG PO TABS
50.0000 ug | ORAL_TABLET | Freq: Every day | ORAL | Status: DC
  Administered 2022-09-11: 50 ug via ORAL
  Filled 2022-09-11: qty 1

## 2022-09-11 MED ORDER — CEFDINIR 300 MG PO CAPS: 300.0000 mg | ORAL_CAPSULE | ORAL | Status: AC

## 2022-09-11 MED ORDER — ROSUVASTATIN CALCIUM 20 MG PO TABS: 20.0000 mg | ORAL_TABLET | Freq: Every day | ORAL | Status: DC

## 2022-09-11 NOTE — Progress Notes (Signed)
Report called to SNF, report given to nurse Chrissy. Patietn transported via [employer]. Stable at discharge. All belongings with patient or with wife at discharge.

## 2022-09-11 NOTE — TOC Transition Note (Signed)
Transition of Care [facility]) - CM/SW Discharge Note   Patient Details  Name: [name] MRN: [id number] Date of Birth: [birth year]  Transition of Care [facility]) CM/SW Contact:  Benard Halsted, LCSW Phone Number: 09/11/2022, 3:50 PM   Clinical Narrative:    Patient will DC to: [facility] Anticipated DC date: 09/11/22 Family notified: Spouse Transport by: Corey Harold   Per MD patient ready for DC to [facility]. RN to call report prior to discharge [phone]). RN, patient, patient's family, and facility notified of DC. Discharge Summary and FL2 sent to facility. DC packet on chart including signed DNR and script. Ambulance transport requested for patient.   CSW will sign off for now as social work intervention is no longer needed. Please consult [place] again if new needs arise.     Final next level of care: Skilled Nursing Facility Barriers to Discharge: Barriers Resolved   Patient Goals and CMS Choice Patient states their goals for this hospitalization and ongoing recovery are:: Rehab CMS Medicare.gov Compare Post Acute Care list provided to:: Patient Represenative (must comment) Choice offered to / list presented to : Spouse  Discharge Placement   Existing PASRR number confirmed : 09/11/22          Patient chooses bed at: [facility] Patient to be transferred to facility by: [facility] Name of family member notified: Wife Patient and family notified of of transfer: 09/11/22  Discharge Plan and Services In-house Referral: Clinical Social Work   Post Acute Care Choice: [place]                               Social Determinants of Health (SDOH) Interventions     Readmission Risk Interventions    09/11/2022    1:11 PM 06/22/2022    1:15 PM 06/15/2022    3:42 PM  Readmission Risk Prevention Plan  Transportation Screening Complete Complete Complete  Medication Review Press photographer) Complete Complete Complete  PCP or Specialist appointment  within 3-5 days of discharge Complete Complete Complete  HRI or Home Care Consult Complete Complete Complete  SW Recovery Care/Counseling Consult Complete Complete Complete  Palliative Care Screening Not Applicable Not Applicable Not [place] Complete Not Applicable Not Applicable

## 2022-09-11 NOTE — TOC Initial Note (Signed)
Transition of Care [facility]) - Initial/Assessment Note    Patient Details  Name: [name] MRN: [id number] Date of Birth: [birth year]  [facility]) CM/SW Contact:    Benard Halsted, LCSW Phone Number: 09/11/2022, 1:34 PM  Clinical Narrative:                 CSW spoke with patient's spouse to confirm plan to discharge back to [facility] today. She requested update from MD; CSW made him aware. Spouse reported agreement with [facility] for discharge. Patient with recent discharge from the hospital therefore has 3 night qualification from last admission.   Expected Discharge Plan: Skilled Nursing Facility Barriers to Discharge: Barriers Resolved   Patient Goals and CMS Choice Patient states their goals for this hospitalization and ongoing recovery are:: Rehab CMS Medicare.gov Compare Post Acute Care list provided to:: Patient Represenative (must comment) Choice offered to / list presented to : Spouse  Expected Discharge Plan and Services Expected Discharge Plan: [place] In-house Referral: Clinical Social Work   Post Acute Care Choice: [place] Living arrangements for the past 2 months: [place] Expected Discharge Date: 09/11/22                                    Prior Living Arrangements/Services Living arrangements for the past 2 months: Single Family Home Lives with:: Spouse Patient language and need for interpreter reviewed:: Yes Do you feel safe going back to the place where you live?: Yes      Need for Family Participation in Patient Care: Yes (Comment) Care giver support system in place?: Yes (comment) Current home services: Homehealth aide Criminal Activity/Legal Involvement Pertinent to Current Situation/Hospitalization: No - Comment as needed  Activities of Daily Living      Permission Sought/Granted Permission sought to share information with : Facility Sport and exercise psychologist, Family Supports Permission  granted to share information with : Yes, Verbal Permission Granted  Share Information with NAME: [name]  Permission granted to share info w AGENCY: [facility]  Permission granted to share info w Relationship: Spouse  Permission granted to share info w Contact Information: [phone]  Emotional Assessment Appearance:: Appears stated age Attitude/Demeanor/Rapport: Unable to Assess Affect (typically observed): Unable to Assess Orientation: : Oriented to Self Alcohol / Substance Use: Not Applicable Psych Involvement: No (comment)  Admission diagnosis:  Influenza A [J10.1] Acute respiratory failure with hypoxia ([place]) [J96.01] ESRD on hemodialysis ([place]) [N18.6, Z99.2] Community acquired pneumonia of left lower lobe of lung [J18.9] Patient Active Problem List   Diagnosis Date Noted   Lung nodule 09/10/2022   Acute respiratory failure with hypoxia ([place]) 09/09/2022   Influenza A with pneumonia 09/09/2022   S/p left hip fracture 08/25/2022   Seizure-like activity ([place]) 08/18/2022   (HFpEF) heart failure with preserved ejection fraction ([place]) 08/18/2022   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   COVID-19 virus infection 06/21/2022   Protein-calorie malnutrition, moderate ([place]) 06/21/2022   DNR (do not resuscitate) 97/10/6376   Systolic and diastolic CHF, chronic ([place]) 06/16/2022   Atypical chest pain 06/16/2022   Anemia of chronic disease 06/16/2022   End-stage renal disease on hemodialysis ([place]) 06/16/2022   Abdominal pain, chronic, left lower quadrant 06/16/2022   Controlled type 2 diabetes mellitus with hyperglycemia ([place]) 06/16/2022   Prolonged QT interval    Acute systolic heart failure ([place])    Chest pain 06/06/2022   Abdominal pain 06/06/2022  Volume overload 06/06/2022   Skin ulcer, limited to breakdown of skin ([place]) 68/07/5725   Chronic systolic congestive heart failure ([place]) 04/12/2022   Depression, major, single episode, moderate (HCC) 04/12/2022   Paroxysmal atrial  fibrillation ([place]) 04/12/2022   UTI (urinary tract infection) 11/05/2021   Pressure injury of skin 11/05/2021   Acute blood loss anemia    Acute GI bleeding 11/04/2021   Pulmonary nodule 10/05/2021   Acute encephalopathy 10/02/2021   Hypernatremia 10/02/2021   Elevated troponin 07/04/2021   Mixed diabetic hyperlipidemia associated with type 2 diabetes mellitus ([place]) 07/04/2021   Alzheimer's dementia with behavioral disturbance ([place]) 01/24/2021   Senile nuclear sclerosis 11/22/2020   Other specified erythematous condition 11/22/2020   Other specified disease of sebaceous glands 11/22/2020   Osteoporosis 11/22/2020   Obesity 11/22/2020   Macrocytosis 11/22/2020   Dystrophia unguium 11/22/2020   Tinea pedis 11/22/2020   Pre-operative clearance    Closed nondisplaced intertrochanteric fracture of right femur ([place]) 11/07/2020   Hip fracture ([place]) 11/07/2020   Fall    Rectal bleeding 04/22/2020   AVM (arteriovenous malformation) of small bowel, acquired    Hiatal hernia    Schatzki's ring of distal esophagus    Other bacterial infections of unspecified site 08/31/2019   Hypocalcemia 04/06/2019   Melena 11/19/2018   Leukocytosis    Anemia    TBI (traumatic brain injury) ([place]) 07/29/2018   Diabetes mellitus type 2 in nonobese (HCC)    Loose stools    Dementia without behavioral disturbance ([place])    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[place]) 07/21/2018   Multiple fractures of ribs, left side, initial encounter for closed fracture 07/21/2018   Subdural hematoma ([place]) 07/21/2018   Gastritis, unspecified, with bleeding 06/04/2018   Angiodysplasia of stomach    Occult GI bleeding    GIB (gastrointestinal bleeding) 06/01/2018   Low back pain 01/17/2018   Anorexia    Diverticulitis of intestine, part unspecified, without perforation or abscess without bleeding 08/07/2017   Dyspnea    Macrocytic anemia 06/04/2016   Thrombocytopenia ([place]) 06/04/2016   Exertional dyspnea  06/04/2016   Arm paresthesia, left 06/04/2016   Dyspnea on exertion 06/04/2016   Tenderness of right calf 06/04/2016   ESRD on dialysis ([place]) 05/17/2015   Hyperlipidemia, unspecified 02/25/2015   Pruritus, unspecified 02/25/2015   Pain, unspecified 02/25/2015   Other specified coagulation defects ([place]) 02/25/2015   Iron deficiency anemia, unspecified 02/25/2015   Headache, unspecified 02/25/2015   Gout, unspecified 02/25/2015   Fever, unspecified 02/25/2015   Diarrhea, unspecified 02/25/2015   Acute on chronic anemia 02/25/2015   Acute embolism and thrombosis of left subclavian vein ([place]) 02/25/2015   Hypoglycemia    Weakness 02/18/2015   OSA (obstructive sleep apnea) 02/02/2015   Hyperkalemia 10/05/2014   ESRD (end stage renal disease) ([place]) 11/18/2013   BPH (benign prostatic hyperplasia) 11/22/2012   Peripheral vascular disease ([place]) 12/24/2011   Hyperparathyroidism ([place]) 12/24/2011   Hypothyroidism 12/24/2011   Allergic rhinitis 12/24/2011   Erectile dysfunction 12/24/2011   Non-insulin dependent type 2 diabetes mellitus ([place]) 09/03/2008   Hyperlipidemia 09/03/2008   CAD in native artery 09/03/2008   Essential hypertension 08/30/2008   PANCREATITIS, HX OF 08/30/2008   RENAL FAILURE, ACUTE, HX OF 08/30/2008   DIVERTICULOSIS, COLON 06/08/2003   PCP:  Elby Showers, MD Pharmacy:  No Pharmacies Listed    Social Determinants of Health (SDOH) Interventions    Readmission Risk Interventions    09/11/2022    1:11 PM 06/22/2022  1:15 PM 06/15/2022    3:42 PM  Readmission Risk Prevention Plan  Transportation Screening Complete Complete Complete  Medication Review ([place]) Complete Complete Complete  PCP or Specialist appointment within 3-5 days of discharge Complete Complete Complete  HRI or Home Care Consult Complete Complete Complete  SW Recovery Care/Counseling Consult Complete Complete Complete  Palliative Care Screening Not Applicable Not Applicable Not  Applicable  Skilled Nursing Facility Complete Not Applicable Not Applicable

## 2022-09-11 NOTE — Progress Notes (Signed)
[facility] Progress Note   Subjective:   Pt was tachycardic and agitated during HD yesterday prompting rapid response. HR was irregular, appeared to be in a.fib (he does have a documented history of a.fib per last d/c summary). This morning, patient is wearing protective mittens but is much calmer on my exam. Says he is "better" and denies SOB, CP, nausea and dizziness.   Objective Vitals:   09/10/22 2200 09/11/22 0000 09/11/22 0306 09/11/22 0751  BP: (!) 135/119 90/60 (!) 102/39 (!) 107/52  Pulse: (!) 116 90 65 60  Resp: '18 20 17 18  '$ Temp:  97.8 F (36.6 C) 97.9 F (36.6 C) 97.9 F (36.6 C)  TempSrc:  Axillary Axillary Axillary  SpO2:  (!) 85%    Weight:      Height:       Physical Exam General: Alert male in NAD. Oriented x2 (name, hospital) Heart: RRR, no murmurs, rubs or gallops Lungs: CTA bilaterally without wheezing, rhonchi or rales. Poor cooperation with exam Abdomen: Soft, non-distended. +BS Extremities: no edema b/l lower extremities Dialysis Access: RUE AVF + bruit (has old AVF in L arm)  Additional Objective Labs: Basic Metabolic Panel: Recent Labs  Lab 09/09/22 1901 09/10/22 0517  NA 141 141  K 3.5 3.6  CL 97* 96*  CO2 28 30  GLUCOSE 126* 110*  BUN 50* 58*  CREATININE 8.11* 8.85*  CALCIUM 9.7 9.7   Liver Function Tests: No results for input(s): "AST", "ALT", "ALKPHOS", "BILITOT", "PROT", "ALBUMIN" in the last 168 hours. No results for input(s): "LIPASE", "AMYLASE" in the last 168 hours. CBC: Recent Labs  Lab 09/09/22 1901 09/10/22 0517  WBC 13.7* 14.1*  NEUTROABS 10.8*  --   HGB 8.6* 7.9*  HCT 26.3* 24.0*  MCV 104.8* 103.9*  PLT 221 206   Blood Culture    Component Value Date/Time   SDES BLOOD BLOOD RIGHT HAND 09/09/2022 2124   SPECREQUEST  09/09/2022 2124    BOTTLES DRAWN AEROBIC AND ANAEROBIC Blood Culture adequate volume   CULT  09/09/2022 2124    NO GROWTH 2 DAYS Performed at [facility], [place] [address] [place], [place] [address]    REPTSTATUS PENDING 09/09/2022 2124    Cardiac Enzymes: No results for input(s): "CKTOTAL", "CKMB", "CKMBINDEX", "TROPONINI" in the last 168 hours. CBG: Recent Labs  Lab 09/10/22 0820 09/10/22 1125 09/10/22 1643 09/11/22 0756 09/11/22 0913  GLUCAP 127* 106* 96 57* 78   Iron Studies: No results for input(s): "IRON", "TIBC", "TRANSFERRIN", "FERRITIN" in the last 72 hours. '@lablastinr3'$ @ Studies/Results: CT Angio Chest PE W and/or Wo Contrast  Result Date: 09/09/2022 CLINICAL DATA:  Shortness of breath for multiple days, with abdominal pain today. Hypoxic oxygen saturation 88%. Pulmonary embolism suspected. High probability. EXAM: CT ANGIOGRAPHY CHEST CT ABDOMEN AND PELVIS WITH CONTRAST TECHNIQUE: Multidetector CT imaging of the chest was performed using the standard protocol during bolus administration of intravenous contrast. Multiplanar CT image reconstructions and MIPs were obtained to evaluate the vascular anatomy. Multidetector CT imaging of the abdomen and pelvis was performed using the standard protocol during bolus administration of intravenous contrast. RADIATION DOSE REDUCTION: This exam was performed according to the departmental dose-optimization program which includes automated exposure control, adjustment of the mA and/or kV according to patient size and/or use of iterative reconstruction technique. CONTRAST:  34m OMNIPAQUE IOHEXOL 350 MG/ML SOLN COMPARISON:  Portable chest today, chest x-ray 08/17/2022, CTA chest 06/06/2022, CT thoracic and lumbar spine 08/25/2022, CT abdomen and pelvis no contrast  08/17/2022 and 04/09/2022, CTA abdomen and pelvis 06/06/2022. FINDINGS: CTA CHEST FINDINGS Cardiovascular: The pulmonary arteries are normal in caliber without evidence of thromboemboli. The cardiac size is normal. There is three-vessel coronary artery calcification. Thoracic aorta is heavily calcified with additional patchy moderate to heavy calcification in  the great vessels. There is no aortic or great vessel flow-limiting stenosis, aneurysm or dissection. The pulmonary veins are decompressed. Mediastinum/Nodes: There are calcified mediastinal and right hilar lymph nodes. There are subcentimeter in short axis right paratracheal noncalcified lymph nodes and slightly prominent subcarinal lymph nodes again measuring up to 1.3 cm in short axis, stable. No further intrathoracic adenopathy is seen. Axillary spaces are clear. The thoracic trachea and thoracic esophagus are unremarkable. Thyroid is unremarkable. Lungs/Pleura: Previously there were small pleural effusions which have resolved since. The lungs are mildly emphysematous with mild paraseptal and centrilobular emphysematous changes in the upper lobes. There is new demonstration of a pleural-based 6 mm nodule in the posterior left upper lobe apex on 4:37. There is a stable 6 mm lingular nodule which has not changed in appearance since abdominopelvic CT 11/19/2018. There is continued bronchial thickening in the lower lobes. On the left, there is a small posterior basal consolidation merging with linear atelectatic bands above it, findings which could be due to pneumonia or atelectasis. Lungs are otherwise clear.  The main bronchi are patent. Musculoskeletal: There is a moderate chronic bow tie compression deformity of the C7 vertebral body, mild upper plate anterior wedging chronically T1, 3, 8 and 12 with no new or worsening compression injury. Generalized osteopenia. There is no displaced rib fracture. Mild right-sided subareolar gynecomastia. Review of the MIP images confirms the above findings. CT ABDOMEN and PELVIS FINDINGS Hepatobiliary: No significant liver abnormality is seen. No gallstones, gallbladder wall thickening, or biliary dilatation. There are stable scattered subcentimeter hepatic hypodensities most likely small cysts. Pancreas: No abnormality. Spleen: No abnormality. Adrenals/Urinary Tract: No  adrenal mass. There is diffuse bilateral end-stage renal atrophy with numerous bilateral cysts. No solid lesion is suspected with some of the hypodensities in both kidneys are too small to adequately characterize. There is no hydronephrosis or stone. The bladder is contracted and not well seen, again noted somewhat thickened and indistinct but there may be increased perivesical stranding which could indicate cystitis. Stomach/Bowel: No dilatation or wall thickening. Uncomplicated sigmoid diverticulosis. An appendix is not seen in this patient. Vascular/Lymphatic: Extensive aortoiliac and branch vessel atherosclerotic versus with patent aortobifem bypass graft. 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 No lymphadenopathy is seen. Reproductive: No prostatomegaly. Other: Surgical changes broad-based abdominal wall repair. No incarcerated hernia. Trace presacral ascites is similar to prior studies. No free air, free hemorrhage or abscess. Musculoskeletal: Osteopenia. Severe chronic L1 compression fracture again noted, treated L3 compression fracture with kyphoplasty cement extruded into the L2-3 disc space, with moderate chronic wedging of L2-5. Bilateral hip nailing hardware creates streak artifact, with left hip intertrochanteric fracture first seen on 08/25/2022. Fracture fixation alignment approximates anatomic. Review of the MIP images confirms the above findings. IMPRESSION: 1. No evidence of pulmonary arterial dilatation or embolus. 2. Aortic and coronary artery atherosclerosis.  Normal cardiac size. 3. Small posterior basal left lower lobe consolidation versus atelectasis. 4. 6 mm pleural-based nodule in the posterior left upper lobe apex, new from 06/06/2022. Follow-up CT 6-12 months recommended per Fleischner guidelines. 5. Stable 6 mm lingular nodule  presumably benign. 6. Chronic bronchial thickening in the lower lobes. 7. Stable mildly prominent subcarinal lymph node. 8.  Chronic end-stage  renal atrophy with numerous cysts. No hydronephrosis or stone. 9. Possible cystitis. 10. Osteopenia and degenerative changes with multiple chronic vertebral body compression fractures. No new fracture is seen. 11. Extensive aortoiliac and branch vessel atherosclerosis with patent aortobifem bypass graft. 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 12. Bilateral hip nailing hardware with left hip intertrochanteric fracture first seen on 08/25/2022. Fracture fixation alignment approximates anatomic. 13. Emphysema. Emphysema (ICD10-J43.9). Electronically Signed   By: Telford Nab M.D.   On: 09/09/2022 23:31   CT ABDOMEN PELVIS W CONTRAST  Result Date: 09/09/2022 CLINICAL DATA:  Shortness of breath for multiple days, with abdominal pain today. Hypoxic oxygen saturation 88%. Pulmonary embolism suspected. High probability. EXAM: CT ANGIOGRAPHY CHEST CT ABDOMEN AND PELVIS WITH CONTRAST TECHNIQUE: Multidetector CT imaging of the chest was performed using the standard protocol during bolus administration of intravenous contrast. Multiplanar CT image reconstructions and MIPs were obtained to evaluate the vascular anatomy. Multidetector CT imaging of the abdomen and pelvis was performed using the standard protocol during bolus administration of intravenous contrast. RADIATION DOSE REDUCTION: This exam was performed according to the departmental dose-optimization program which includes automated exposure control, adjustment of the mA and/or kV according to patient size and/or use of iterative reconstruction technique. CONTRAST:  41m OMNIPAQUE IOHEXOL 350 MG/ML SOLN COMPARISON:  Portable chest today, chest x-ray 08/17/2022, CTA chest 06/06/2022, CT thoracic and lumbar spine 08/25/2022, CT abdomen and pelvis no contrast 08/17/2022 and 04/09/2022, CTA abdomen and pelvis 06/06/2022. FINDINGS: CTA CHEST FINDINGS Cardiovascular: The pulmonary arteries are normal in caliber without evidence of thromboemboli. The  cardiac size is normal. There is three-vessel coronary artery calcification. Thoracic aorta is heavily calcified with additional patchy moderate to heavy calcification in the great vessels. There is no aortic or great vessel flow-limiting stenosis, aneurysm or dissection. The pulmonary veins are decompressed. Mediastinum/Nodes: There are calcified mediastinal and right hilar lymph nodes. There are subcentimeter in short axis right paratracheal noncalcified lymph nodes and slightly prominent subcarinal lymph nodes again measuring up to 1.3 cm in short axis, stable. No further intrathoracic adenopathy is seen. Axillary spaces are clear. The thoracic trachea and thoracic esophagus are unremarkable. Thyroid is unremarkable. Lungs/Pleura: Previously there were small pleural effusions which have resolved since. The lungs are mildly emphysematous with mild paraseptal and centrilobular emphysematous changes in the upper lobes. There is new demonstration of a pleural-based 6 mm nodule in the posterior left upper lobe apex on 4:37. There is a stable 6 mm lingular nodule which has not changed in appearance since abdominopelvic CT 11/19/2018. There is continued bronchial thickening in the lower lobes. On the left, there is a small posterior basal consolidation merging with linear atelectatic bands above it, findings which could be due to pneumonia or atelectasis. Lungs are otherwise clear.  The main bronchi are patent. Musculoskeletal: There is a moderate chronic bow tie compression deformity of the C7 vertebral body, mild upper plate anterior wedging chronically T1, 3, 8 and 12 with no new or worsening compression injury. Generalized osteopenia. There is no displaced rib fracture. Mild right-sided subareolar gynecomastia. Review of the MIP images confirms the above findings. CT ABDOMEN and PELVIS FINDINGS Hepatobiliary: No significant liver abnormality is seen. No gallstones, gallbladder wall thickening, or biliary dilatation.  There are stable scattered subcentimeter hepatic hypodensities most likely small cysts. Pancreas: No abnormality. Spleen: No abnormality. Adrenals/Urinary Tract: No adrenal mass. There is diffuse bilateral end-stage renal atrophy with numerous bilateral cysts. No solid lesion is suspected with some  of the hypodensities in both kidneys are too small to adequately characterize. There is no hydronephrosis or stone. The bladder is contracted and not well seen, again noted somewhat thickened and indistinct but there may be increased perivesical stranding which could indicate cystitis. Stomach/Bowel: No dilatation or wall thickening. Uncomplicated sigmoid diverticulosis. An appendix is not seen in this patient. Vascular/Lymphatic: Extensive aortoiliac and branch vessel atherosclerotic versus with patent aortobifem bypass graft. 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 No lymphadenopathy is seen. Reproductive: No prostatomegaly. Other: Surgical changes broad-based abdominal wall repair. No incarcerated hernia. Trace presacral ascites is similar to prior studies. No free air, free hemorrhage or abscess. Musculoskeletal: Osteopenia. Severe chronic L1 compression fracture again noted, treated L3 compression fracture with kyphoplasty cement extruded into the L2-3 disc space, with moderate chronic wedging of L2-5. Bilateral hip nailing hardware creates streak artifact, with left hip intertrochanteric fracture first seen on 08/25/2022. Fracture fixation alignment approximates anatomic. Review of the MIP images confirms the above findings. IMPRESSION: 1. No evidence of pulmonary arterial dilatation or embolus. 2. Aortic and coronary artery atherosclerosis.  Normal cardiac size. 3. Small posterior basal left lower lobe consolidation versus atelectasis. 4. 6 mm pleural-based nodule in the posterior left upper lobe apex, new from 06/06/2022. Follow-up CT 6-12 months recommended per Fleischner guidelines. 5. Stable 6  mm lingular nodule  presumably benign. 6. Chronic bronchial thickening in the lower lobes. 7. Stable mildly prominent subcarinal lymph node. 8. Chronic end-stage renal atrophy with numerous cysts. No hydronephrosis or stone. 9. Possible cystitis. 10. Osteopenia and degenerative changes with multiple chronic vertebral body compression fractures. No new fracture is seen. 11. Extensive aortoiliac and branch vessel atherosclerosis with patent aortobifem bypass graft. 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 12. Bilateral hip nailing hardware with left hip intertrochanteric fracture first seen on 08/25/2022. Fracture fixation alignment approximates anatomic. 13. Emphysema. Emphysema (ICD10-J43.9). Electronically Signed   By: Telford Nab M.D.   On: 09/09/2022 23:31   DG Chest Port 1 View  Result Date: 09/09/2022 CLINICAL DATA:  Shortness of breath EXAM: PORTABLE CHEST 1 VIEW COMPARISON:  Chest x-ray 08/17/2022 FINDINGS: There is a band of opacity in the left lung base. The lungs are otherwise clear. No pleural effusion or pneumothorax. There are severe atherosclerotic calcifications of the aorta. Cardiomediastinal silhouette is within normal limits. No acute fractures. IMPRESSION: Band of opacity in the left lung base, atelectasis versus infection. Electronically Signed   By: Ronney Asters M.D.   On: 09/09/2022 19:26   Medications:   Chlorhexidine Gluconate Cloth  6 each Topical Q0600   darbepoetin (ARANESP) injection - DIALYSIS  100 mcg Subcutaneous Q Mon-1800   heparin  5,000 Units Subcutaneous Q8H   insulin aspart  0-9 Units Subcutaneous TID WC   levothyroxine  50 mcg Oral Daily   metoprolol tartrate  50 mg Oral BID   midodrine  10 mg Oral Q M,W,F   oseltamivir  30 mg Oral Q T,Th,Sa-HD   pantoprazole  40 mg Oral Daily   rosuvastatin  20 mg Oral QHS    Dialysis Orders: Center: [facility]  on MWF . 180NRe 3hr 75mn BFR 400 DFR Auto 1.5 EDW 64.5kg 2K 2Ca AVF 15g  No heparin Mircera  1573m IV q 2 weeks Hectorol 8m78mIV q HD  Assessment/Plan:  Influenza A: received tamiflu, management per admitting team  ESRD:  on HD MWF, continue current schedule.   Hypertension/volume: BP variable in the ED. No volume overload on exam. Takes  midodrine '10mg'$  pre-HD. Bed weight is 3.5kg over his EDW but not sure if this is accurate. Did not tolerate much UF yesterday due to a. Fib.    Anemia: Hgb  7.9. No bleeding reported. Received IV iron during last admission. Will restart ESA,  Metabolic bone disease: Calcium elevated, will hold VDRA for now. Not on any binder, follow phos.   Nutrition:  renal diet/fluid restrictions. Albumin pending.  T2DM: insulin per admitting team Dementia: Mental status worse on HD yesterday, suspect infection is exacerbating his dementia. Improved today.   Anice Paganini, PA-C 09/11/2022, 10:02 AM  [facility] Pager: [phone]

## 2022-09-11 NOTE — Discharge Summary (Addendum)
Physician Discharge Summary  [name] QPR:[id number] DOB: September 26, 1940 DOA: 09/09/2022  PCP: Elby Showers, MD  Admit date: 09/09/2022 Discharge date: 09/11/2022  Admitted From: (SNF) Disposition:  (SNF)  Recommendations for Outpatient Follow-up:  Follow up with PCP in 1-2 weeks Please obtain BMP/CBC in one week   Discharge Condition: (Stable) CODE STATUS: (DNR) Diet recommendation: Renal diet with 1200 cc fluid restrictions  Brief/Interim Summary:  81 year old gentleman with history of Alzheimer's dementia, ESRD on hemodialysis, hypertension, type 2 diabetes, coronary artery disease and peripheral vascular disease recently discharged to a skilled nursing facility sent back to the emergency room with shortness of breath for past several days. He was found to be 88% oxygenation on room air. He was discharged to a SNF after hip fracture surgery on 12/7. In the emergency room requiring 2 L oxygen. Hemodynamically stable. Found to have influenza positive.    Acute respiratory failure with hypoxemia secondary to influenza A pneumonia: -Patient workup was significant for influenza A for pneumonia, he was treated with IV Rocephin and azithromycin, respiratory status much improved, he will be discharged on p.o. cefdinir and p.o. Tamiflu.  ESRD on hemodialysis -Consulted, to continue hemodialysis on TTS schedule  Hypothyroidism, on levothyroxine.  Continue.  Hypertension/hypotension: Not on treatment.  Blood pressure is low, supported by midodrine.  Continue.  Non-insulin-dependent type 2 diabetes: On sliding scale insulin during hospital stay, continue with Tradjenta as an outpatient   Paroxysmal atrial fibrillation -With known history of paroxysmal A-fib, not on anticoagulation due to history of GI bleed/AVM/subdural hematoma. -Amiodarone in the past has been stopped due to prolonged QTc, not on any AV prolonging agent due to soft blood pressure, patient had 1 episode of A-fib  with RVR yesterday in the beginning of hemodialysis, this has resolved spontaneously without any intervention  Discharge Diagnoses:  Principal Problem:   Acute respiratory failure with hypoxia (HCC) Active Problems:   Non-insulin dependent type 2 diabetes mellitus ([place])   Essential hypertension   Hypothyroidism   ESRD on dialysis ([place])   Dementia without behavioral disturbance ([place])   Influenza A with pneumonia   Lung nodule    Discharge Instructions  Discharge Instructions     Diet - low sodium heart healthy   Complete by: As directed    Discharge instructions   Complete by: As directed    Follow with Primary MD Elby Showers, MD/SNF physician  Get CBC, CMP, checked  by Primary MD next visit.    Activity: As tolerated with Full fall precautions use walker/cane & assistance as needed   Disposition Home   Diet: Renal with 1200 cc fluid restriction  On your next visit with your primary care physician please Get Medicines reviewed and adjusted.   Please request your Prim.MD to go over all Hospital Tests and Procedure/Radiological results at the follow up, please get all Hospital records sent to your Prim MD by signing hospital release before you go home.   If you experience worsening of your admission symptoms, develop shortness of breath, life threatening emergency, suicidal or homicidal thoughts you must seek medical attention immediately by calling 911 or calling your MD immediately  if symptoms less severe.  You Must read complete instructions/literature along with all the possible adverse reactions/side effects for all the Medicines you take and that have been prescribed to you. Take any new Medicines after you have completely understood and accpet all the possible adverse reactions/side effects.   Do not drive, operating heavy machinery, perform activities at  heights, swimming or participation in water activities or provide baby sitting services if your were  admitted for syncope or siezures until you have seen by Primary MD or a Neurologist and advised to do so again.  Do not drive when taking Pain medications.    Do not take more than prescribed Pain, Sleep and Anxiety Medications  Special Instructions: If you have smoked or chewed Tobacco  in the last 2 yrs please stop smoking, stop any regular Alcohol  and or any Recreational drug use.  Wear Seat belts while driving.   Please note  You were cared for by a hospitalist during your hospital stay. If you have any questions about your discharge medications or the care you received while you were in the hospital after you are discharged, you can call the unit and asked to speak with the hospitalist on call if the hospitalist that took care of you is not available. Once you are discharged, your primary care physician will handle any further medical issues. Please note that NO REFILLS for any discharge medications will be authorized once you are discharged, as it is imperative that you return to your primary care physician (or establish a relationship with a primary care physician if you do not have one) for your aftercare needs so that they can reassess your need for medications and monitor your lab values.   Increase activity slowly   Complete by: As directed       Allergies as of 09/11/2022       Reactions   Ambien [zolpidem Tartrate] Other (See Comments)   Hallucinations and "felt crazy". Confusion    Penicillins Hives, Swelling, Rash   Has patient had a PCN reaction causing immediate rash, facial/tongue/throat swelling, SOB or lightheadedness with hypotension: Yes Has patient had a PCN reaction causing severe rash involving mucus membranes or skin necrosis: Yes Has patient had a PCN reaction that required hospitalization: No Has patient had a PCN reaction occurring within the last 10 years: No If all of the above answers are "NO", then may proceed with Cephalosporin use.         Medication List     STOP taking these medications    butalbital-acetaminophen-caffeine 50-325-40 MG tablet Commonly known as: FIORICET   oxyCODONE-acetaminophen 5-325 MG tablet Commonly known as: PERCOCET/ROXICET       TAKE these medications    acetaminophen 325 MG tablet Commonly known as: TYLENOL Take 2 tablets (650 mg total) by mouth every 6 (six) hours as needed for mild pain, moderate pain, fever or headache. What changed:  how much to take when to take this reasons to take this   albuterol 108 (90 Base) MCG/ACT inhaler Commonly known as: VENTOLIN HFA Inhale 2 puffs into the lungs daily as needed for wheezing or shortness of breath.   allopurinol 100 MG tablet Commonly known as: ZYLOPRIM Take 100 mg by mouth daily.   camphor-menthol lotion Commonly known as: SARNA Apply 1 application  topically in the morning and at bedtime.   cefdinir 300 MG capsule Commonly known as: OMNICEF Take 1 capsule (300 mg total) by mouth every Monday, Wednesday, and Friday. Allises days after finishing dialysis for 3 more doses. Start taking on: September 12, 2022   cetirizine 10 MG tablet Commonly known as: ZYRTEC Take 10 mg by mouth daily as needed for allergies.   cyanocobalamin 1000 MCG/ML injection Commonly known as: VITAMIN B12 Inject 1 mL (1,000 mcg total) into the muscle every 30 (thirty) days.  donepezil 10 MG tablet Commonly known as: ARICEPT Take 1 tablet (10 mg total) by mouth at bedtime.   doxercalciferol 4 MCG/2ML injection Commonly known as: HECTOROL Inject 2.5 mLs (5 mcg total) into the vein every Monday, Wednesday, and Friday with hemodialysis.   feeding supplement (NEPRO CARB STEADY) Liqd Take 237 mLs by mouth in the morning and at bedtime.   fluticasone 50 MCG/ACT nasal spray Commonly known as: FLONASE Place 1 spray into both nostrils daily. What changed:  when to take this reasons to take this   freestyle lancets Use as instructed to check blood  sugar 2 times per day dx code E11.65   FREESTYLE LITE test strip Generic drug: glucose blood USE AS DIRECTED THREE TIMES DAILY   hydrocortisone 2.5 % cream Apply 1 Application topically daily.   Lacosamide 100 MG Tabs Take 1 tablet (100 mg total) by mouth 2 (two) times daily.   levothyroxine 50 MCG tablet Commonly known as: SYNTHROID Take 1 tablet (50 mcg total) by mouth daily.   memantine 10 MG tablet Commonly known as: NAMENDA Take 1 tablet (10 mg total) by mouth 2 (two) times daily.   midodrine 10 MG tablet Commonly known as: PROAMATINE Take one pill prior to hemodialysis on MWF What changed:  how much to take how to take this when to take this additional instructions   omeprazole 40 MG capsule Commonly known as: PRILOSEC Take 1 capsule (40 mg total) by mouth daily. What changed: when to take this   oseltamivir 30 MG capsule Commonly known as: TAMIFLU Take 1 capsule (30 mg total) by mouth every Monday, Wednesday, and Friday for 5 days. Please take after hemodialysis Start taking on: September 12, 2022   oxyCODONE 5 MG immediate release tablet Commonly known as: Roxicodone Take 1 tablet (5 mg total) by mouth every 6 (six) hours as needed for severe pain or breakthrough pain.   QUEtiapine 25 MG tablet Commonly known as: SEROquel Take 1 tablet (25 mg total) by mouth at bedtime.   rosuvastatin 20 MG tablet Commonly known as: CRESTOR Take 20 mg by mouth at bedtime.   Theratears 0.25 % Soln Generic drug: Carboxymethylcellulose Sodium Place 1 drop into both eyes as needed (for dryness).   Tradjenta 5 MG Tabs tablet Generic drug: linagliptin Take 1 tablet (5 mg total) by mouth daily.        Contact information for after-discharge care     Destination     [facility] Preferred SNF .   Service: Skilled Nursing Contact information: [address] [place] [address] [phone]                     Allergies  Allergen Reactions   Ambien [Zolpidem Tartrate] Other (See Comments)    Hallucinations and "felt crazy". Confusion    Penicillins Hives, Swelling and Rash    Has patient had a PCN reaction causing immediate rash, facial/tongue/throat swelling, SOB or lightheadedness with hypotension: Yes Has patient had a PCN reaction causing severe rash involving mucus membranes or skin necrosis: Yes Has patient had a PCN reaction that required hospitalization: No Has patient had a PCN reaction occurring within the last 10 years: No If all of the above answers are "NO", then may proceed with Cephalosporin use.     Consultations: renal   Procedures/Studies: CT Angio Chest PE W and/or Wo Contrast  Result Date: 09/09/2022 CLINICAL DATA:  Shortness of breath for multiple days, with abdominal pain today.  Hypoxic oxygen saturation 88%. Pulmonary embolism suspected. High probability. EXAM: CT ANGIOGRAPHY CHEST CT ABDOMEN AND PELVIS WITH CONTRAST TECHNIQUE: Multidetector CT imaging of the chest was performed using the standard protocol during bolus administration of intravenous contrast. Multiplanar CT image reconstructions and MIPs were obtained to evaluate the vascular anatomy. Multidetector CT imaging of the abdomen and pelvis was performed using the standard protocol during bolus administration of intravenous contrast. RADIATION DOSE REDUCTION: This exam was performed according to the departmental dose-optimization program which includes automated exposure control, adjustment of the mA and/or kV according to patient size and/or use of iterative reconstruction technique. CONTRAST:  47m OMNIPAQUE IOHEXOL 350 MG/ML SOLN COMPARISON:  Portable chest today, chest x-ray 08/17/2022, CTA chest 06/06/2022, CT thoracic and lumbar spine 08/25/2022, CT abdomen and pelvis no contrast 08/17/2022 and 04/09/2022, CTA abdomen and pelvis 06/06/2022. FINDINGS: CTA CHEST FINDINGS Cardiovascular: The pulmonary arteries  are normal in caliber without evidence of thromboemboli. The cardiac size is normal. There is three-vessel coronary artery calcification. Thoracic aorta is heavily calcified with additional patchy moderate to heavy calcification in the great vessels. There is no aortic or great vessel flow-limiting stenosis, aneurysm or dissection. The pulmonary veins are decompressed. Mediastinum/Nodes: There are calcified mediastinal and right hilar lymph nodes. There are subcentimeter in short axis right paratracheal noncalcified lymph nodes and slightly prominent subcarinal lymph nodes again measuring up to 1.3 cm in short axis, stable. No further intrathoracic adenopathy is seen. Axillary spaces are clear. The thoracic trachea and thoracic esophagus are unremarkable. Thyroid is unremarkable. Lungs/Pleura: Previously there were small pleural effusions which have resolved since. The lungs are mildly emphysematous with mild paraseptal and centrilobular emphysematous changes in the upper lobes. There is new demonstration of a pleural-based 6 mm nodule in the posterior left upper lobe apex on 4:37. There is a stable 6 mm lingular nodule which has not changed in appearance since abdominopelvic CT 11/19/2018. There is continued bronchial thickening in the lower lobes. On the left, there is a small posterior basal consolidation merging with linear atelectatic bands above it, findings which could be due to pneumonia or atelectasis. Lungs are otherwise clear.  The main bronchi are patent. Musculoskeletal: There is a moderate chronic bow tie compression deformity of the C7 vertebral body, mild upper plate anterior wedging chronically T1, 3, 8 and 12 with no new or worsening compression injury. Generalized osteopenia. There is no displaced rib fracture. Mild right-sided subareolar gynecomastia. Review of the MIP images confirms the above findings. CT ABDOMEN and PELVIS FINDINGS Hepatobiliary: No significant liver abnormality is seen. No  gallstones, gallbladder wall thickening, or biliary dilatation. There are stable scattered subcentimeter hepatic hypodensities most likely small cysts. Pancreas: No abnormality. Spleen: No abnormality. Adrenals/Urinary Tract: No adrenal mass. There is diffuse bilateral end-stage renal atrophy with numerous bilateral cysts. No solid lesion is suspected with some of the hypodensities in both kidneys are too small to adequately characterize. There is no hydronephrosis or stone. The bladder is contracted and not well seen, again noted somewhat thickened and indistinct but there may be increased perivesical stranding which could indicate cystitis. Stomach/Bowel: No dilatation or wall thickening. Uncomplicated sigmoid diverticulosis. An appendix is not seen in this patient. Vascular/Lymphatic: Extensive aortoiliac and branch vessel atherosclerotic versus with patent aortobifem bypass graft. 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 No lymphadenopathy is seen. Reproductive: No prostatomegaly. Other: Surgical changes broad-based abdominal wall repair. No incarcerated hernia. Trace presacral ascites is similar to prior studies. No free air, free hemorrhage or abscess.  Musculoskeletal: Osteopenia. Severe chronic L1 compression fracture again noted, treated L3 compression fracture with kyphoplasty cement extruded into the L2-3 disc space, with moderate chronic wedging of L2-5. Bilateral hip nailing hardware creates streak artifact, with left hip intertrochanteric fracture first seen on 08/25/2022. Fracture fixation alignment approximates anatomic. Review of the MIP images confirms the above findings. IMPRESSION: 1. No evidence of pulmonary arterial dilatation or embolus. 2. Aortic and coronary artery atherosclerosis.  Normal cardiac size. 3. Small posterior basal left lower lobe consolidation versus atelectasis. 4. 6 mm pleural-based nodule in the posterior left upper lobe apex, new from 06/06/2022. Follow-up CT  6-12 months recommended per Fleischner guidelines. 5. Stable 6 mm lingular nodule  presumably benign. 6. Chronic bronchial thickening in the lower lobes. 7. Stable mildly prominent subcarinal lymph node. 8. Chronic end-stage renal atrophy with numerous cysts. No hydronephrosis or stone. 9. Possible cystitis. 10. Osteopenia and degenerative changes with multiple chronic vertebral body compression fractures. No new fracture is seen. 11. Extensive aortoiliac and branch vessel atherosclerosis with patent aortobifem bypass graft. 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 12. Bilateral hip nailing hardware with left hip intertrochanteric fracture first seen on 08/25/2022. Fracture fixation alignment approximates anatomic. 13. Emphysema. Emphysema (ICD10-J43.9). Electronically Signed   By: Telford Nab M.D.   On: 09/09/2022 23:31   CT ABDOMEN PELVIS W CONTRAST  Result Date: 09/09/2022 CLINICAL DATA:  Shortness of breath for multiple days, with abdominal pain today. Hypoxic oxygen saturation 88%. Pulmonary embolism suspected. High probability. EXAM: CT ANGIOGRAPHY CHEST CT ABDOMEN AND PELVIS WITH CONTRAST TECHNIQUE: Multidetector CT imaging of the chest was performed using the standard protocol during bolus administration of intravenous contrast. Multiplanar CT image reconstructions and MIPs were obtained to evaluate the vascular anatomy. Multidetector CT imaging of the abdomen and pelvis was performed using the standard protocol during bolus administration of intravenous contrast. RADIATION DOSE REDUCTION: This exam was performed according to the departmental dose-optimization program which includes automated exposure control, adjustment of the mA and/or kV according to patient size and/or use of iterative reconstruction technique. CONTRAST:  49m OMNIPAQUE IOHEXOL 350 MG/ML SOLN COMPARISON:  Portable chest today, chest x-ray 08/17/2022, CTA chest 06/06/2022, CT thoracic and lumbar spine 08/25/2022, CT  abdomen and pelvis no contrast 08/17/2022 and 04/09/2022, CTA abdomen and pelvis 06/06/2022. FINDINGS: CTA CHEST FINDINGS Cardiovascular: The pulmonary arteries are normal in caliber without evidence of thromboemboli. The cardiac size is normal. There is three-vessel coronary artery calcification. Thoracic aorta is heavily calcified with additional patchy moderate to heavy calcification in the great vessels. There is no aortic or great vessel flow-limiting stenosis, aneurysm or dissection. The pulmonary veins are decompressed. Mediastinum/Nodes: There are calcified mediastinal and right hilar lymph nodes. There are subcentimeter in short axis right paratracheal noncalcified lymph nodes and slightly prominent subcarinal lymph nodes again measuring up to 1.3 cm in short axis, stable. No further intrathoracic adenopathy is seen. Axillary spaces are clear. The thoracic trachea and thoracic esophagus are unremarkable. Thyroid is unremarkable. Lungs/Pleura: Previously there were small pleural effusions which have resolved since. The lungs are mildly emphysematous with mild paraseptal and centrilobular emphysematous changes in the upper lobes. There is new demonstration of a pleural-based 6 mm nodule in the posterior left upper lobe apex on 4:37. There is a stable 6 mm lingular nodule which has not changed in appearance since abdominopelvic CT 11/19/2018. There is continued bronchial thickening in the lower lobes. On the left, there is a small posterior basal consolidation merging with linear atelectatic bands  above it, findings which could be due to pneumonia or atelectasis. Lungs are otherwise clear.  The main bronchi are patent. Musculoskeletal: There is a moderate chronic bow tie compression deformity of the C7 vertebral body, mild upper plate anterior wedging chronically T1, 3, 8 and 12 with no new or worsening compression injury. Generalized osteopenia. There is no displaced rib fracture. Mild right-sided subareolar  gynecomastia. Review of the MIP images confirms the above findings. CT ABDOMEN and PELVIS FINDINGS Hepatobiliary: No significant liver abnormality is seen. No gallstones, gallbladder wall thickening, or biliary dilatation. There are stable scattered subcentimeter hepatic hypodensities most likely small cysts. Pancreas: No abnormality. Spleen: No abnormality. Adrenals/Urinary Tract: No adrenal mass. There is diffuse bilateral end-stage renal atrophy with numerous bilateral cysts. No solid lesion is suspected with some of the hypodensities in both kidneys are too small to adequately characterize. There is no hydronephrosis or stone. The bladder is contracted and not well seen, again noted somewhat thickened and indistinct but there may be increased perivesical stranding which could indicate cystitis. Stomach/Bowel: No dilatation or wall thickening. Uncomplicated sigmoid diverticulosis. An appendix is not seen in this patient. Vascular/Lymphatic: Extensive aortoiliac and branch vessel atherosclerotic versus with patent aortobifem bypass graft. 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 No lymphadenopathy is seen. Reproductive: No prostatomegaly. Other: Surgical changes broad-based abdominal wall repair. No incarcerated hernia. Trace presacral ascites is similar to prior studies. No free air, free hemorrhage or abscess. Musculoskeletal: Osteopenia. Severe chronic L1 compression fracture again noted, treated L3 compression fracture with kyphoplasty cement extruded into the L2-3 disc space, with moderate chronic wedging of L2-5. Bilateral hip nailing hardware creates streak artifact, with left hip intertrochanteric fracture first seen on 08/25/2022. Fracture fixation alignment approximates anatomic. Review of the MIP images confirms the above findings. IMPRESSION: 1. No evidence of pulmonary arterial dilatation or embolus. 2. Aortic and coronary artery atherosclerosis.  Normal cardiac size. 3. Small posterior  basal left lower lobe consolidation versus atelectasis. 4. 6 mm pleural-based nodule in the posterior left upper lobe apex, new from 06/06/2022. Follow-up CT 6-12 months recommended per Fleischner guidelines. 5. Stable 6 mm lingular nodule  presumably benign. 6. Chronic bronchial thickening in the lower lobes. 7. Stable mildly prominent subcarinal lymph node. 8. Chronic end-stage renal atrophy with numerous cysts. No hydronephrosis or stone. 9. Possible cystitis. 10. Osteopenia and degenerative changes with multiple chronic vertebral body compression fractures. No new fracture is seen. 11. Extensive aortoiliac and branch vessel atherosclerosis with patent aortobifem bypass graft. 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 12. Bilateral hip nailing hardware with left hip intertrochanteric fracture first seen on 08/25/2022. Fracture fixation alignment approximates anatomic. 13. Emphysema. Emphysema (ICD10-J43.9). Electronically Signed   By: Telford Nab M.D.   On: 09/09/2022 23:31   DG Chest Port 1 View  Result Date: 09/09/2022 CLINICAL DATA:  Shortness of breath EXAM: PORTABLE CHEST 1 VIEW COMPARISON:  Chest x-ray 08/17/2022 FINDINGS: There is a band of opacity in the left lung base. The lungs are otherwise clear. No pleural effusion or pneumothorax. There are severe atherosclerotic calcifications of the aorta. Cardiomediastinal silhouette is within normal limits. No acute fractures. IMPRESSION: Band of opacity in the left lung base, atelectasis versus infection. Electronically Signed   By: Ronney Asters M.D.   On: 09/09/2022 19:26   DG HIP UNILAT WITH PELVIS 2-3 VIEWS LEFT  Result Date: 08/26/2022 CLINICAL DATA:  Status post internal fixation of fracture of left femur EXAM: DG HIP (WITH OR WITHOUT PELVIS) 2-3V  LEFT COMPARISON:  08/25/2022 FINDINGS: There is interval internal fixation of intertrochanteric fracture of left femur with intramedullary rod and surgical screw. There is previous  internal fixation in the proximal right femur. Extensive arterial calcifications are seen. There are surgical clips in both inguinal regions, possibly suggesting previous vascular intervention. There is evidence of vertebroplasty in lower lumbar spine. IMPRESSION: Interval internal fixation of intertrochanteric fracture of left femur. Severe arteriosclerosis. Electronically Signed   By: Elmer Picker M.D.   On: 08/26/2022 13:35   DG FEMUR MIN 2 VIEWS LEFT  Result Date: 08/26/2022 CLINICAL DATA:  Left hip arthroplasty Fluoroscopy time: 65.6 seconds. Images: 6 Air kerma: 11.64 mGy EXAM: LEFT FEMUR 2 VIEWS COMPARISON:  August 25, 2022 FINDINGS: Images were obtained during repair of the left hip fracture. A gamma nail and intramedullary rod are identified by the end of the study with a distal interlocking screw. IMPRESSION: Left hip arthroplasty as above. Electronically Signed   By: Dorise Bullion III M.D.   On: 08/26/2022 11:55   DG C-Arm 1-60 Min-No Report  Result Date: 08/26/2022 Fluoroscopy was utilized by the requesting physician.  No radiographic interpretation.   CT Lumbar Spine Wo Contrast  Result Date: 08/25/2022 CLINICAL DATA:  Fall EXAM: CT THORACIC AND LUMBAR SPINE WITHOUT CONTRAST TECHNIQUE: Multidetector CT imaging of the thoracic and lumbar spine was performed without contrast. Multiplanar CT image reconstructions were also generated. RADIATION DOSE REDUCTION: This exam was performed according to the departmental dose-optimization program which includes automated exposure control, adjustment of the mA and/or kV according to patient size and/or use of iterative reconstruction technique. COMPARISON:  CTA chest 01/12/2021 CT abdomen pelvis 08/17/2022 FINDINGS: CT THORACIC SPINE FINDINGS Alignment: Normal. Vertebrae: Height loss at T1, T3 and T8 is unchanged. No acute fracture. Paraspinal and other soft tissues: Calcific aortic atherosclerosis. Disc levels: No spinal canal stenosis. CT  LUMBAR SPINE FINDINGS Segmentation: 5 lumbar type vertebrae. Alignment: Normal. Vertebrae: Unchanged vertebra plana configuration of L1 compared to 08/17/2022. Remote augmentation of L3 with cement extending into the L2-3 disc space. Height loss at the other lumbar levels is also unchanged. Paraspinal and other soft tissues: Calcific aortic atherosclerosis. Infrarenal aortic graft. 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 IMPRESSION: 1. No acute fracture or static subluxation of the thoracic or lumbar spine. 2. Unchanged vertebra plana configuration of L1 compared to 08/17/2022. 3. Remote augmentation of L3 with cement extending into the L2-3 disc space. 4. 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 5. Chronic compression deformities of T1, T3 and T8 are unchanged since 01/12/2021. Aortic Atherosclerosis (ICD10-I70.0). Electronically Signed   By: Ulyses Jarred M.D.   On: 08/25/2022 20:16   CT Thoracic Spine Wo Contrast  Result Date: 08/25/2022 CLINICAL DATA:  Fall EXAM: CT THORACIC AND LUMBAR SPINE WITHOUT CONTRAST TECHNIQUE: Multidetector CT imaging of the thoracic and lumbar spine was performed without contrast. Multiplanar CT image reconstructions were also generated. RADIATION DOSE REDUCTION: This exam was performed according to the departmental dose-optimization program which includes automated exposure control, adjustment of the mA and/or kV according to patient size and/or use of iterative reconstruction technique. COMPARISON:  CTA chest 01/12/2021 CT abdomen pelvis 08/17/2022 FINDINGS: CT THORACIC SPINE FINDINGS Alignment: Normal. Vertebrae: Height loss at T1, T3 and T8 is unchanged. No acute fracture. Paraspinal and other soft tissues: Calcific aortic atherosclerosis. Disc levels: No spinal canal stenosis. CT LUMBAR SPINE FINDINGS Segmentation: 5 lumbar type vertebrae. Alignment: Normal. Vertebrae: Unchanged vertebra plana configuration of L1 compared to  08/17/2022. Remote  augmentation of L3 with cement extending into the L2-3 disc space. Height loss at the other lumbar levels is also unchanged. Paraspinal and other soft tissues: Calcific aortic atherosclerosis. Infrarenal aortic graft. 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 IMPRESSION: 1. No acute fracture or static subluxation of the thoracic or lumbar spine. 2. Unchanged vertebra plana configuration of L1 compared to 08/17/2022. 3. Remote augmentation of L3 with cement extending into the L2-3 disc space. 4. 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 5. Chronic compression deformities of T1, T3 and T8 are unchanged since 01/12/2021. Aortic Atherosclerosis (ICD10-I70.0). Electronically Signed   By: Ulyses Jarred M.D.   On: 08/25/2022 20:16   DG Hip Unilat W or Wo Pelvis 2-3 Views Left  Result Date: 08/25/2022 CLINICAL DATA:  Left hip pain. EXAM: DG HIP (WITH OR WITHOUT PELVIS) 3V LEFT COMPARISON:  Left hip radiographs 11/03/2021. FINDINGS: A minimally displaced intratrochanteric fracture is present in the proximal left femur. The hip joint is located. Extensive vascular calcifications are present. Right hip ORIF noted. Bowel gas pattern is normal. IMPRESSION: 1. Minimally displaced intratrochanteric fracture of the proximal left femur. 2. Extensive vascular calcifications. Electronically Signed   By: San Morelle M.D.   On: 08/25/2022 19:34   EEG adult  Result Date: 08/18/2022 Derek Jack, MD     08/18/2022  6:42 PM Routine EEG Report MARSEL GAIL is a 81 y.o. male with a history of seizure-like episode who is undergoing an EEG to evaluate for seizures. Report: This EEG was acquired with electrodes placed according to the International 10-20 electrode system (including Fp1, Fp2, F3, F4, C3, C4, P3, P4, O1, O2, T3, T4, T5, T6, A1, A2, Fz, Cz, Pz). The following electrodes were missing or displaced: none. The occipital dominant rhythm was 6-7 Hz with overriding beta frequencies. This  activity is reactive to stimulation. Drowsiness was manifested by background fragmentation; deeper stages of sleep were identified by K complexes and sleep spindles. There was no focal slowing. There were no interictal epileptiform discharges. There were no electrographic seizures identified. Photic stimulation and hyperventilation were not performed. Impression and clinical correlation: This EEG was obtained while awake and asleep and is abnormal due to mild-to-moderate diffuse slowing indicative of global cerebral dysfunction. Epileptiform abnormalities were not seen during this recording. Su Monks, MD [facility] [phone] If 7pm- 7am, please page neurology on call as listed in [place].   CT ABDOMEN PELVIS WO CONTRAST  Result Date: 08/18/2022 CLINICAL DATA:  Pain and elevated lipase EXAM: CT ABDOMEN AND PELVIS WITHOUT CONTRAST TECHNIQUE: Multidetector CT imaging of the abdomen and pelvis was performed following the standard protocol without IV contrast. RADIATION DOSE REDUCTION: This exam was performed according to the departmental dose-optimization program which includes automated exposure control, adjustment of the mA and/or kV according to patient size and/or use of iterative reconstruction technique. COMPARISON:  CT abdomen and pelvis 06/14/2022 FINDINGS: Lower chest: Emphysema. Bibasilar atelectasis/scarring. No acute abnormality. Hepatobiliary: No suspicious focal liver abnormality is seen. Subcentimeter low-density lesions are unchanged and presumably cysts. No specific follow-up imaging recommended. No gallstones, gallbladder wall thickening, or biliary dilatation. Pancreas: Unremarkable. No pancreatic ductal dilatation or surrounding inflammatory changes. Spleen: Normal in size without focal abnormality. Adrenals/Urinary Tract: Unremarkable adrenal glands. Atrophic native kidneys. Unchanged low-attenuation lesions in the kidneys are statistically likely to represent cysts. No  follow-up is required. Stomach/Bowel: Stomach is within normal limits. Appendix appears normal. No evidence of bowel wall thickening, distention, or  inflammatory changes. Vascular/Lymphatic: Advanced aortic atherosclerotic calcification. No abdominopelvic lymphadenopathy. Prior aorto bi-iliac stent graft. Reproductive: Unremarkable. Other: No free intraperitoneal fluid or air. Postsurgical changes anterior abdominal wall, unchanged. Musculoskeletal: No interval change. Compression fractures of all of the lumbar vertebral bodies are similar and most marked about L1 where there is complete vertebral body height loss. Vertebroplasty L3. Right hip ORIF. IMPRESSION: No acute abnormality in the abdomen or pelvis. Multiple chronic findings detailed and findings and unchanged from 06/14/2022. Electronically Signed   By: Placido Sou M.D.   On: 08/18/2022 00:00   CT HEAD WO CONTRAST (5MM)  Result Date: 08/17/2022 CLINICAL DATA:  Twitching, possible seizure activity EXAM: CT HEAD WITHOUT CONTRAST TECHNIQUE: Contiguous axial images were obtained from the base of the skull through the vertex without intravenous contrast. RADIATION DOSE REDUCTION: This exam was performed according to the departmental dose-optimization program which includes automated exposure control, adjustment of the mA and/or kV according to patient size and/or use of iterative reconstruction technique. COMPARISON:  05/24/2022 FINDINGS: Brain: No acute hemorrhage, acute infarction or space-occupying mass lesion is noted. Chronic white matter ischemic changes and generalized atrophy are again noted. Bifrontal fluid collections are again identified stable in appearance from the prior CT examination left slightly greater than right consistent with chronic subdural hematomas. No acute component is noted. Vascular: No hyperdense vessel or unexpected calcification. Skull: Negative for fracture or focal lesion. Prior bilateral burr holes are again noted.  Sinuses/Orbits: No acute finding. Other: None. 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 No acute component is noted. Electronically Signed   By: Inez Catalina M.D.   On: 08/17/2022 23:04   DG Chest 2 View  Result Date: 08/17/2022 CLINICAL DATA:  Generalized weakness EXAM: CHEST - 2 VIEW COMPARISON:  06/21/2022 FINDINGS: Cardiac shadow is prominent but stable. Aortic calcifications are seen. The lungs are well aerated bilaterally. No focal infiltrate or effusion noted. No bony abnormality is seen. IMPRESSION: No active cardiopulmonary disease. Electronically Signed   By: Inez Catalina M.D.   On: 08/17/2022 22:59   (Echo, Carotid, EGD, Colonoscopy, ERCP)    Subjective:  Had an episode of A-fib with RVR during teaching his HD yesterday, this resolved without intervention Discharge Exam: Vitals:   09/11/22 1148 09/11/22 1526  BP: (!) 123/49 (!) 112/36  Pulse: (!) 54 (!) 56  Resp:    Temp: 97.6 F (36.4 C)   SpO2:     Vitals:   09/11/22 0306 09/11/22 0751 09/11/22 1148 09/11/22 1526  BP: (!) 102/39 (!) 107/52 (!) 123/49 (!) 112/36  Pulse: 65 60 (!) 54 (!) 56  Resp: 17 18    Temp: 97.9 F (36.6 C) 97.9 F (36.6 C) 97.6 F (36.4 C)   TempSrc: Axillary Axillary Oral Oral  SpO2:      Weight:      Height:        General: Pt is alert, awake, frail, deconditioned Cardiovascular: RRR, S1/S2 +, no rubs, no gallops Respiratory: CTA bilaterally, no wheezing, no rhonchi Abdominal: Soft, NT, ND, bowel sounds + Extremities: no edema, no cyanosis    The results of significant diagnostics from this hospitalization (including imaging, microbiology, ancillary and laboratory) are listed below for reference.     Microbiology: Recent Results (from the past 240 hour(s))  Resp panel by RT-PCR (RSV, Flu A&B, Covid) Anterior Nasal Swab     Status: Abnormal   Collection Time: 09/09/22  8:27 PM   Specimen: Anterior Nasal Swab  Result Value  Ref Range Status   SARS Coronavirus 2 by RT PCR NEGATIVE NEGATIVE Final    Comment: (NOTE) SARS-CoV-2 target nucleic acids are NOT DETECTED.  The SARS-CoV-2 RNA is generally detectable in upper respiratory specimens during the acute phase of infection. The lowest concentration of SARS-CoV-2 viral copies this assay can detect is 138 copies/mL. A negative result does not preclude SARS-Cov-2 infection and should not be used as the sole basis for treatment or other patient management decisions. A negative result may occur with  improper specimen collection/handling, submission of specimen other than nasopharyngeal swab, presence of viral mutation(s) within the areas targeted by this assay, and inadequate number of viral copies(<138 copies/mL). A negative result must be combined with clinical observations, patient history, and epidemiological information. The expected result is Negative.  Fact Sheet for Patients:  EntrepreneurPulse.com.au  Fact Sheet for Healthcare Providers:  IncredibleEmployment.be  This test is no t yet approved or cleared by the [place] FDA and  has been authorized for detection and/or diagnosis of SARS-CoV-2 by FDA under an Emergency Use Authorization (EUA). This EUA will remain  in effect (meaning this test can be used) for the duration of the COVID-19 declaration under Section 564(b)(1) of the Act, 21 U.S.C.section 360bbb-3(b)(1), unless the authorization is terminated  or revoked sooner.       Influenza A by PCR POSITIVE (A) NEGATIVE Final   Influenza B by PCR NEGATIVE NEGATIVE Final    Comment: (NOTE) The Xpert Xpress SARS-CoV-2/FLU/RSV plus assay is intended as an aid in the diagnosis of influenza from Nasopharyngeal swab specimens and should not be used as a sole basis for treatment. Nasal washings and aspirates are unacceptable for Xpert Xpress SARS-CoV-2/FLU/RSV testing.  Fact Sheet for  Patients: EntrepreneurPulse.com.au  Fact Sheet for Healthcare Providers: IncredibleEmployment.be  This test is not yet approved or cleared by the [place] FDA and has been authorized for detection and/or diagnosis of SARS-CoV-2 by FDA under an Emergency Use Authorization (EUA). This EUA will remain in effect (meaning this test can be used) for the duration of the COVID-19 declaration under Section 564(b)(1) of the Act, 21 U.S.C. section 360bbb-3(b)(1), unless the authorization is terminated or revoked.     Resp Syncytial Virus by PCR NEGATIVE NEGATIVE Final    Comment: (NOTE) Fact Sheet for Patients: EntrepreneurPulse.com.au  Fact Sheet for Healthcare Providers: IncredibleEmployment.be  This test is not yet approved or cleared by the [place] FDA and has been authorized for detection and/or diagnosis of SARS-CoV-2 by FDA under an Emergency Use Authorization (EUA). This EUA will remain in effect (meaning this test can be used) for the duration of the COVID-19 declaration under Section 564(b)(1) of the Act, 21 U.S.C. section 360bbb-3(b)(1), unless the authorization is terminated or revoked.  Performed at [facility], [place] [address] [place], [place] [address]   Culture, blood (routine x 2)     Status: None (Preliminary result)   Collection Time: 09/09/22  9:19 PM   Specimen: BLOOD  Result Value Ref Range Status   Specimen Description BLOOD RIGHT ANTECUBITAL  Final   Special Requests   Final    BOTTLES DRAWN AEROBIC AND ANAEROBIC Blood Culture adequate volume   Culture   Final    NO GROWTH 2 DAYS Performed at [facility], [place] [address], [place],  [address]    Report Status PENDING  Incomplete  Culture, blood (routine x 2)     Status: None (Preliminary result)   Collection  Time: 09/09/22  9:24 PM   Specimen: BLOOD  Result Value Ref Range Status   Specimen Description  BLOOD BLOOD RIGHT HAND  Final   Special Requests   Final    BOTTLES DRAWN AEROBIC AND ANAEROBIC Blood Culture adequate volume   Culture   Final    NO GROWTH 2 DAYS Performed at [facility], [address], [place], [place] [address]    Report Status PENDING  Incomplete     Labs: BNP (last 3 results) Recent Labs    06/06/22 2010 06/21/22 0038 09/09/22 1901  BNP 3,250.3* 1,674.6* 272.5*   Basic Metabolic Panel: Recent Labs  Lab 09/09/22 1901 09/10/22 0517  NA 141 141  K 3.5 3.6  CL 97* 96*  CO2 28 30  GLUCOSE 126* 110*  BUN 50* 58*  CREATININE 8.11* 8.85*  CALCIUM 9.7 9.7   Liver Function Tests: No results for input(s): "AST", "ALT", "ALKPHOS", "BILITOT", "PROT", "ALBUMIN" in the last 168 hours. No results for input(s): "LIPASE", "AMYLASE" in the last 168 hours. No results for input(s): "AMMONIA" in the last 168 hours. CBC: Recent Labs  Lab 09/09/22 1901 09/10/22 0517  WBC 13.7* 14.1*  NEUTROABS 10.8*  --   HGB 8.6* 7.9*  HCT 26.3* 24.0*  MCV 104.8* 103.9*  PLT 221 206   Cardiac Enzymes: No results for input(s): "CKTOTAL", "CKMB", "CKMBINDEX", "TROPONINI" in the last 168 hours. BNP: Invalid input(s): "POCBNP" CBG: Recent Labs  Lab 09/10/22 1125 09/10/22 1643 09/11/22 0756 09/11/22 0913 09/11/22 1152  GLUCAP 106* 96 57* 78 88   D-Dimer No results for input(s): "DDIMER" in the last 72 hours. Hgb A1c No results for input(s): "HGBA1C" in the last 72 hours. Lipid Profile No results for input(s): "CHOL", "HDL", "LDLCALC", "TRIG", "CHOLHDL", "LDLDIRECT" in the last 72 hours. Thyroid function studies No results for input(s): "TSH", "T4TOTAL", "T3FREE", "THYROIDAB" in the last 72 hours.  Invalid input(s): "FREET3" Anemia work up No results for input(s): "VITAMINB12", "FOLATE", "FERRITIN", "TIBC", "IRON", "RETICCTPCT" in the last 72 hours. Urinalysis    Component Value Date/Time   COLORURINE AMBER (A) 04/09/2022 2051   APPEARANCEUR CLOUDY (A)  04/09/2022 2051   LABSPEC 1.013 04/09/2022 2051   PHURINE 8.0 04/09/2022 2051   GLUCOSEU NEGATIVE 04/09/2022 2051   HGBUR SMALL (A) 04/09/2022 2051   BILIRUBINUR NEGATIVE 04/09/2022 2051   BILIRUBINUR neg 02/14/2015 1115   KETONESUR NEGATIVE 04/09/2022 2051   PROTEINUR >=300 (A) 04/09/2022 2051   UROBILINOGEN 0.2 02/20/2015 0944   NITRITE NEGATIVE 04/09/2022 2051   LEUKOCYTESUR LARGE (A) 04/09/2022 2051   Sepsis Labs Recent Labs  Lab 09/09/22 1901 09/10/22 0517  WBC 13.7* 14.1*   Microbiology Recent Results (from the past 240 hour(s))  Resp panel by RT-PCR (RSV, Flu A&B, Covid) Anterior Nasal Swab     Status: Abnormal   Collection Time: 09/09/22  8:27 PM   Specimen: Anterior Nasal Swab  Result Value Ref Range Status   SARS Coronavirus 2 by RT PCR NEGATIVE NEGATIVE Final    Comment: (NOTE) SARS-CoV-2 target nucleic acids are NOT DETECTED.  The SARS-CoV-2 RNA is generally detectable in upper respiratory specimens during the acute phase of infection. The lowest concentration of SARS-CoV-2 viral copies this assay can detect is 138 copies/mL. A negative result does not preclude SARS-Cov-2 infection and should not be used as the sole basis for treatment or other patient management decisions. A negative result may occur with  improper specimen collection/handling, submission of specimen other than nasopharyngeal swab, presence of  viral mutation(s) within the areas targeted by this assay, and inadequate number of viral copies(<138 copies/mL). A negative result must be combined with clinical observations, patient history, and epidemiological information. The expected result is Negative.  Fact Sheet for Patients:  EntrepreneurPulse.com.au  Fact Sheet for Healthcare Providers:  IncredibleEmployment.be  This test is no t yet approved or cleared by the [place] FDA and  has been authorized for detection and/or diagnosis of SARS-CoV-2  by FDA under an Emergency Use Authorization (EUA). This EUA will remain  in effect (meaning this test can be used) for the duration of the COVID-19 declaration under Section 564(b)(1) of the Act, 21 U.S.C.section 360bbb-3(b)(1), unless the authorization is terminated  or revoked sooner.       Influenza A by PCR POSITIVE (A) NEGATIVE Final   Influenza B by PCR NEGATIVE NEGATIVE Final    Comment: (NOTE) The Xpert Xpress SARS-CoV-2/FLU/RSV plus assay is intended as an aid in the diagnosis of influenza from Nasopharyngeal swab specimens and should not be used as a sole basis for treatment. Nasal washings and aspirates are unacceptable for Xpert Xpress SARS-CoV-2/FLU/RSV testing.  Fact Sheet for Patients: EntrepreneurPulse.com.au  Fact Sheet for Healthcare Providers: IncredibleEmployment.be  This test is not yet approved or cleared by the [place] FDA and has been authorized for detection and/or diagnosis of SARS-CoV-2 by FDA under an Emergency Use Authorization (EUA). This EUA will remain in effect (meaning this test can be used) for the duration of the COVID-19 declaration under Section 564(b)(1) of the Act, 21 U.S.C. section 360bbb-3(b)(1), unless the authorization is terminated or revoked.     Resp Syncytial Virus by PCR NEGATIVE NEGATIVE Final    Comment: (NOTE) Fact Sheet for Patients: EntrepreneurPulse.com.au  Fact Sheet for Healthcare Providers: IncredibleEmployment.be  This test is not yet approved or cleared by the [place] FDA and has been authorized for detection and/or diagnosis of SARS-CoV-2 by FDA under an Emergency Use Authorization (EUA). This EUA will remain in effect (meaning this test can be used) for the duration of the COVID-19 declaration under Section 564(b)(1) of the Act, 21 U.S.C. section 360bbb-3(b)(1), unless the authorization is terminated or revoked.  Performed at  [facility], [place] [address] [place], [place] [address]   Culture, blood (routine x 2)     Status: None (Preliminary result)   Collection Time: 09/09/22  9:19 PM   Specimen: BLOOD  Result Value Ref Range Status   Specimen Description BLOOD RIGHT ANTECUBITAL  Final   Special Requests   Final    BOTTLES DRAWN AEROBIC AND ANAEROBIC Blood Culture adequate volume   Culture   Final    NO GROWTH 2 DAYS Performed at [facility], [place] [address] [place], [place] [address]    Report Status PENDING  Incomplete  Culture, blood (routine x 2)     Status: None (Preliminary result)   Collection Time: 09/09/22  9:24 PM   Specimen: BLOOD  Result Value Ref Range Status   Specimen Description BLOOD BLOOD RIGHT HAND  Final   Special Requests   Final    BOTTLES DRAWN AEROBIC AND ANAEROBIC Blood Culture adequate volume   Culture   Final    NO GROWTH 2 DAYS Performed at [facility], [place] [address], [place], [place] [address]    Report Status PENDING  Incomplete     Time coordinating discharge: Over 30 minutes  SIGNED:   Phillips Climes, MD  [facility] 09/11/2022, 4:14 PM Pager  If 7PM-7AM, please contact night-coverage [email or web address] Password [email or web address]

## 2022-09-11 NOTE — Consult Note (Signed)
   [facility] Inpatient Consult   09/11/2022  [name] 1941/02/11 [id number]  [facility] [ACO] Patient: Medicare ACO REACH  Primary Care Provider:  Elby Showers, MD   Patient was reviewed for less than 30 days readmission with an extreme high risk score patient is to return to SNF level of care.    If the patient goes to a [facility] affiliated facility then, patient can be followed by [facility] PAC RN with traditional Medicare and approved Medicare Advantage plans.    Plan:   Notify [facility] RN who can follow for any known or needs for transitional care needs for returning to post facility care coordination needs to return to community.  For questions or referrals, please contact:   Natividad Brood, RN BSN [place]  [phone] business mobile phone Toll free office [phone]  *[place]  [phone] Fax number: [phone] [name]

## 2022-09-11 NOTE — Discharge Instructions (Signed)
Follow with Primary MD Renold Genta Cresenciano Lick, MD/SNF physician  Get CBC, CMP, checked  by Primary MD next visit.    Activity: As tolerated with Full fall precautions use walker/cane & assistance as needed   Disposition Home   Diet: Renal with 1200 cc fluid restriction  On your next visit with your primary care physician please Get Medicines reviewed and adjusted.   Please request your Prim.MD to go over all Hospital Tests and Procedure/Radiological results at the follow up, please get all Hospital records sent to your Prim MD by signing hospital release before you go home.   If you experience worsening of your admission symptoms, develop shortness of breath, life threatening emergency, suicidal or homicidal thoughts you must seek medical attention immediately by calling 911 or calling your MD immediately  if symptoms less severe.  You Must read complete instructions/literature along with all the possible adverse reactions/side effects for all the Medicines you take and that have been prescribed to you. Take any new Medicines after you have completely understood and accpet all the possible adverse reactions/side effects.   Do not drive, operating heavy machinery, perform activities at heights, swimming or participation in water activities or provide baby sitting services if your were admitted for syncope or siezures until you have seen by Primary MD or a Neurologist and advised to do so again.  Do not drive when taking Pain medications.    Do not take more than prescribed Pain, Sleep and Anxiety Medications  Special Instructions: If you have smoked or chewed Tobacco  in the last 2 yrs please stop smoking, stop any regular Alcohol  and or any Recreational drug use.  Wear Seat belts while driving.   Please note  You were cared for by a hospitalist during your hospital stay. If you have any questions about your discharge medications or the care you received while you were in the hospital  after you are discharged, you can call the unit and asked to speak with the hospitalist on call if the hospitalist that took care of you is not available. Once you are discharged, your primary care physician will handle any further medical issues. Please note that NO REFILLS for any discharge medications will be authorized once you are discharged, as it is imperative that you return to your primary care physician (or establish a relationship with a primary care physician if you do not have one) for your aftercare needs so that they can reassess your need for medications and monitor your lab values.

## 2022-09-11 NOTE — NC FL2 (Signed)
[place] LEVEL OF CARE FORM     IDENTIFICATION  Patient Name: [name] Birthdate: 24-Nov-1940 Sex: male Admission Date (Current Location): 09/09/2022  [facility] and [place] Number:  Herbalist and Address:  [facility] [place], [place] [address], [place], [place] [address]      Provider Number: [id number]  Attending Physician Name and Address:  Elgergawy, Silver Huguenin, MD  Relative Name and Phone Number:       Current Level of Care: Hospital Recommended Level of Care: [place] Prior Approval Number:    Date Approved/Denied:   PASRR Number: [id number]  Discharge Plan: SNF    Current Diagnoses: Patient Active Problem List   Diagnosis Date Noted   Lung nodule 09/10/2022   Acute respiratory failure with hypoxia ([place]) 09/09/2022   Influenza A with pneumonia 09/09/2022   S/p left hip fracture 08/25/2022   Seizure-like activity ([place]) 08/18/2022   (HFpEF) heart failure with preserved ejection fraction ([place]) 08/18/2022   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   COVID-19 virus infection 06/21/2022   Protein-calorie malnutrition, moderate ([place]) 06/21/2022   DNR (do not resuscitate) 29/47/6546   Systolic and diastolic CHF, chronic ([place]) 06/16/2022   Atypical chest pain 06/16/2022   Anemia of chronic disease 06/16/2022   End-stage renal disease on hemodialysis ([place]) 06/16/2022   Abdominal pain, chronic, left lower quadrant 06/16/2022   Controlled type 2 diabetes mellitus with hyperglycemia ([place]) 06/16/2022   Prolonged QT interval    Acute systolic heart failure ([place])    Chest pain 06/06/2022   Abdominal pain 06/06/2022   Volume overload 06/06/2022   Skin ulcer, limited to breakdown of skin ([place]) 50/35/4656   Chronic systolic congestive heart failure ([place]) 04/12/2022   Depression, major, single episode, moderate ([place]) 04/12/2022   Paroxysmal atrial fibrillation ([place]) 04/12/2022   UTI (urinary tract infection) 11/05/2021    Pressure injury of skin 11/05/2021   Acute blood loss anemia    Acute GI bleeding 11/04/2021   Pulmonary nodule 10/05/2021   Acute encephalopathy 10/02/2021   Hypernatremia 10/02/2021   Elevated troponin 07/04/2021   Mixed diabetic hyperlipidemia associated with type 2 diabetes mellitus ([place]) 07/04/2021   Alzheimer's dementia with behavioral disturbance ([place]) 01/24/2021   Senile nuclear sclerosis 11/22/2020   Other specified erythematous condition 11/22/2020   Other specified disease of sebaceous glands 11/22/2020   Osteoporosis 11/22/2020   Obesity 11/22/2020   Macrocytosis 11/22/2020   Dystrophia unguium 11/22/2020   Tinea pedis 11/22/2020   Pre-operative clearance    Closed nondisplaced intertrochanteric fracture of right femur ([place]) 11/07/2020   Hip fracture ([place]) 11/07/2020   Fall    Rectal bleeding 04/22/2020   AVM (arteriovenous malformation) of small bowel, acquired    Hiatal hernia    Schatzki's ring of distal esophagus    Other bacterial infections of unspecified site 08/31/2019   Hypocalcemia 04/06/2019   Melena 11/19/2018   Leukocytosis    Anemia    TBI (traumatic brain injury) ([place]) 07/29/2018   Diabetes mellitus type 2 in nonobese (HCC)    Loose stools    Dementia without behavioral disturbance ([place])    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[place]) 07/21/2018   Multiple fractures of ribs, left side, initial encounter for closed fracture 07/21/2018   Subdural hematoma ([place]) 07/21/2018   Gastritis, unspecified, with bleeding 06/04/2018   Angiodysplasia of stomach    Occult GI bleeding    GIB (gastrointestinal bleeding) 06/01/2018   Low back pain 01/17/2018  Anorexia    Diverticulitis of intestine, part unspecified, without perforation or abscess without bleeding 08/07/2017   Dyspnea    Macrocytic anemia 06/04/2016   Thrombocytopenia ([place]) 06/04/2016   Exertional dyspnea 06/04/2016   Arm paresthesia, left 06/04/2016   Dyspnea on exertion  06/04/2016   Tenderness of right calf 06/04/2016   ESRD on dialysis ([place]) 05/17/2015   Hyperlipidemia, unspecified 02/25/2015   Pruritus, unspecified 02/25/2015   Pain, unspecified 02/25/2015   Other specified coagulation defects ([place]) 02/25/2015   Iron deficiency anemia, unspecified 02/25/2015   Headache, unspecified 02/25/2015   Gout, unspecified 02/25/2015   Fever, unspecified 02/25/2015   Diarrhea, unspecified 02/25/2015   Acute on chronic anemia 02/25/2015   Acute embolism and thrombosis of left subclavian vein ([place]) 02/25/2015   Hypoglycemia    Weakness 02/18/2015   OSA (obstructive sleep apnea) 02/02/2015   Hyperkalemia 10/05/2014   ESRD (end stage renal disease) ([place]) 11/18/2013   BPH (benign prostatic hyperplasia) 11/22/2012   Peripheral vascular disease ([place]) 12/24/2011   Hyperparathyroidism ([place]) 12/24/2011   Hypothyroidism 12/24/2011   Allergic rhinitis 12/24/2011   Erectile dysfunction 12/24/2011   Non-insulin dependent type 2 diabetes mellitus ([place]) 09/03/2008   Hyperlipidemia 09/03/2008   CAD in native artery 09/03/2008   Essential hypertension 08/30/2008   PANCREATITIS, HX OF 08/30/2008   RENAL FAILURE, ACUTE, HX OF 08/30/2008   DIVERTICULOSIS, COLON 06/08/2003    Orientation RESPIRATION BLADDER Height & Weight     Self  O2 (2L nasal cannula) Continent Weight: 154 lb 5.2 oz (70 kg) Height:  '5\' 8"'$  (172.7 cm)  BEHAVIORAL SYMPTOMS/MOOD NEUROLOGICAL BOWEL NUTRITION STATUS      Incontinent Diet (See dc summary)  AMBULATORY STATUS COMMUNICATION OF NEEDS Skin   Extensive Assist Verbally PU Stage and Appropriate Care, Surgical wounds (Stage II on coccyx; closed incision on hip/thigh)                       Personal Care Assistance Level of Assistance  Bathing, Feeding, Dressing Bathing Assistance: Maximum assistance Feeding assistance: Limited assistance Dressing Assistance: Limited assistance     Functional Limitations Info  Sight, Hearing Sight Info:  Impaired Hearing Info: Impaired      SPECIAL CARE FACTORS FREQUENCY  PT (By licensed PT), OT (By licensed OT)     PT Frequency: 5x/week OT Frequency: 5x/week            Contractures Contractures Info: Not present    Additional Factors Info  Code Status, Allergies, Isolation Precautions Code Status Info: DNR Allergies Info: Ambien (Zolpidem Tartrate), Penicillins     Isolation Precautions Info: Flu+     Current Medications (09/11/2022):  This is the current hospital active medication list Current Facility-Administered Medications  Medication Dose Route Frequency Provider Last Rate Last Admin   Chlorhexidine Gluconate Cloth 2 % PADS 6 each  6 each Topical Q0600 Donato Heinz, MD   6 each at 09/11/22 0827   Chlorhexidine Gluconate Cloth 2 % PADS 6 each  6 each Topical Q0600 Janalee Dane, PA-C       Darbepoetin Alfa (ARANESP) injection 100 mcg  100 mcg Subcutaneous Q Tue-1800 Collins, Samantha G, PA-C       heparin injection 5,000 Units  5,000 Units Subcutaneous Q8H Tu, Ching T, DO   5,000 Units at 09/11/22 0549   insulin aspart (novoLOG) injection 0-9 Units  0-9 Units Subcutaneous TID WC Tu, Ching T, DO   1 Units at 09/10/22 0843   levothyroxine (SYNTHROID) tablet  50 mcg  50 mcg Oral Daily Elgergawy, Silver Huguenin, MD   50 mcg at 09/11/22 0825   metoprolol tartrate (LOPRESSOR) tablet 50 mg  50 mg Oral BID Quintella Baton, MD   50 mg at 09/11/22 0825   midodrine (PROAMATINE) tablet 10 mg  10 mg Oral Q M,W,F Tu, Ching T, DO   10 mg at 09/10/22 1121   oseltamivir (TAMIFLU) capsule 30 mg  30 mg Oral Q T,Th,Sa-HD Tu, Ching T, DO       pantoprazole (PROTONIX) EC tablet 40 mg  40 mg Oral Daily Elgergawy, Silver Huguenin, MD   40 mg at 09/11/22 0825   rosuvastatin (CRESTOR) tablet 20 mg  20 mg Oral QHS Elgergawy, Silver Huguenin, MD         Discharge Medications: Please see discharge summary for a list of discharge medications.  Relevant Imaging Results:  Relevant Lab  Results:   Additional Information SSN# [id number]  , HD MWF /[facility] E. Weed, LCSW

## 2022-09-11 NOTE — Evaluation (Signed)
Occupational Therapy Evaluation Patient Details Name: [name] MRN: [id number] DOB: 1941/06/02 Today's Date: 09/11/2022   History of Present Illness 81 yo male with recent L hip ORIF WBAT was readmitted on 12/17 for respiratory failure with hypoxia, now dx with flu A.  Had incident with rapid response on 12/18 in HD with drop in BP and a-fib, tx was stopped.  PMHx:  advanced dementia, DM, HTN, hypothyroidism, ESRD on HD, lung nodule, PVD, CAD, suspected seizure  with L side weakness and speech changes, L hip fracture   Clinical Impression   Pt questionable historian, reports needing assist at baseline for mobility with use of RW and w/c, assist for ADLs. Pt currently needing set up -mod A for ADLs, max A+2 for bed mobility, and mod A+2 for transfers with use of RW. Pt with decreased attention, needing max cues for task sequencing  and redirection during session. Pt presenting with impairments listed below, will follow acutely. Recommend SNF at d/c.      Recommendations for follow up therapy are one component of a multi-disciplinary discharge planning process, led by the attending physician.  Recommendations may be updated based on patient status, additional functional criteria and insurance authorization.   Follow Up Recommendations  Skilled nursing-short term rehab (<3 hours/day)     Assistance Recommended at Discharge Frequent or constant Supervision/Assistance  Patient can return home with the following Two people to help with walking and/or transfers;Two people to help with bathing/dressing/bathroom;Assistance with feeding;Help with stairs or ramp for entrance;Assist for transportation;Direct supervision/assist for financial management;Direct supervision/assist for medications management    Functional Status Assessment  Patient has had a recent decline in their functional status and demonstrates the ability to make significant improvements in function in a reasonable and predictable  amount of time.  Equipment Recommendations  Other (comment) (defer)    Recommendations for Other Services PT consult     Precautions / Restrictions Precautions Precautions: Fall Precaution Comments: recent falls with most recent resulting in L hip fracture Restrictions Weight Bearing Restrictions: Yes LLE Weight Bearing: Weight bearing as tolerated      Mobility Bed Mobility Overal bed mobility: Needs Assistance Bed Mobility: Supine to Sit, Sit to Supine     Supine to sit: Max assist, +2 for safety/equipment, +2 for physical assistance Sit to supine: Max assist, +2 for safety/equipment, +2 for physical assistance   General bed mobility comments: increased time needed due to pain/ anxiety    Transfers Overall transfer level: Needs assistance Equipment used: Rolling walker (2 wheels), 2 person hand held assist Transfers: Sit to/from Stand Sit to Stand: Mod assist, +2 physical assistance, +2 safety/equipment           General transfer comment: bil feet sliding with standing, cues to remain upright      Balance Overall balance assessment: History of Falls, Needs assistance Sitting-balance support: Feet supported, Bilateral upper extremity supported Sitting balance-Leahy Scale: Poor   Postural control: Posterior lean, Right lateral lean Standing balance support: Bilateral upper extremity supported, During functional activity Standing balance-Leahy Scale: Poor                             ADL either performed or assessed with clinical judgement   ADL Overall ADL's : Needs assistance/impaired Eating/Feeding: Set up;Sitting   Grooming: Set up;Sitting   Upper Body Bathing: Moderate assistance;Sitting   Lower Body Bathing: Maximal assistance;Sitting/lateral leans   Upper Body Dressing : Moderate assistance;Sitting  Lower Body Dressing: Maximal assistance;Sitting/lateral leans   Toilet Transfer: Moderate assistance;+2 for physical assistance    Toileting- Clothing Manipulation and Hygiene: Maximal assistance Toileting - Clothing Manipulation Details (indicate cue type and reason): pericare     Functional mobility during ADLs: Moderate assistance;+2 for physical assistance       Vision   Additional Comments: will further assess     Perception Perception Perception Tested?: No   Praxis Praxis Praxis tested?: Not tested    Pertinent Vitals/Pain Pain Assessment Pain Assessment: Faces Pain Score: 6  Faces Pain Scale: Hurts even more Pain Location: L hip and peri area Pain Descriptors / Indicators: Guarding, Grimacing Pain Intervention(s): Limited activity within patient's tolerance, Monitored during session, Repositioned     Hand Dominance Right   Extremity/Trunk Assessment Upper Extremity Assessment Upper Extremity Assessment: Generalized weakness   Lower Extremity Assessment Lower Extremity Assessment: Defer to PT evaluation LLE Deficits / Details: requires minor assist to move LLE and does not tolerate resistance LLE: Unable to fully assess due to pain LLE Coordination: decreased gross motor   Cervical / Trunk Assessment Cervical / Trunk Assessment: Other exceptions;Kyphotic (mild change in upper back)   Communication Communication Communication: HOH;Other (comment) (hx of advanced dementia)   Cognition Arousal/Alertness: Awake/alert Behavior During Therapy: Anxious, Agitated Overall Cognitive Status: History of cognitive impairments - at baseline                                 General Comments: hx of advanced dementia, oriented to self only, verbose at times during conversation     General Comments  VSS on RA, spouse in room at end of session    Exercises     Shoulder Instructions      Home Living Family/patient expects to be discharged to:: Skilled nursing facility Living Arrangements: Spouse/significant other Available Help at Discharge: Family;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 hours/day Type of  Home: House Home Access: Stairs to enter [employer] of Steps: stair lift in the garage Entrance Stairs-Rails: Right;Left;Can reach both Home Layout: Two level;Able to live on main level with bedroom/bathroom         Bathroom Toilet: Handicapped height Bathroom Accessibility: Yes   Home Equipment: Conservation officer, nature (2 wheels);Hospital bed;Transport chair;Wheelchair - manual;Grab bars - toilet   Additional Comments: home info taken from chart review from prior admissions      Prior Functioning/Environment Prior Level of Function : History of Falls (last six months)             Mobility Comments: prior level was RW at home but then was in rehab just prior to this for L hip fracture ADLs Comments: pt questionable historian, assuming assist for ADLs        OT Problem List: Decreased strength;Decreased range of motion;Decreased activity tolerance;Impaired balance (sitting and/or standing);Decreased cognition;Decreased safety awareness;Decreased knowledge of use of DME or AE;Pain      OT Treatment/Interventions: Self-care/ADL training;DME and/or AE instruction;Balance training;Patient/family education;Therapeutic exercise    OT Goals(Current goals can be found in the care plan section) Acute Rehab OT Goals Patient Stated Goal: none stated OT Goal Formulation: With patient Time For Goal Achievement: 09/25/22 Potential to Achieve Goals: Fair ADL Goals Pt Will Perform Upper Body Dressing: with min assist;sitting Pt Will Perform Lower Body Dressing: with min assist;sitting/lateral leans;sit to/from stand Pt Will Transfer to Toilet: with min assist;bedside commode;squat pivot transfer;stand pivot transfer Additional ADL Goal #1: pt will complete bed mobility with  mod A in prep for ADLs  OT Frequency: Min 2X/week    Co-evaluation PT/OT/SLP Co-Evaluation/Treatment: Yes Reason for Co-Treatment: Complexity of the patient's impairments (multi-system involvement);To address  functional/ADL transfers;For patient/therapist safety   OT goals addressed during session: ADL's and self-care;Strengthening/ROM      AM-PAC OT "6 Clicks" Daily Activity     Outcome Measure Help from another person eating meals?: A Little Help from another person taking care of personal grooming?: A Little Help from another person toileting, which includes using toliet, bedpan, or urinal?: A Lot Help from another person bathing (including washing, rinsing, drying)?: A Lot Help from another person to put on and taking off regular upper body clothing?: A Lot Help from another person to put on and taking off regular lower body clothing?: Total 6 Click Score: 13   End of Session Equipment Utilized During Treatment: Gait belt;Rolling walker (2 wheels) Nurse Communication: Mobility status  Activity Tolerance: Patient tolerated treatment well Patient left: in bed;with call bell/phone within reach;with bed alarm set;with family/visitor present  OT Visit Diagnosis: History of falling (Z91.81);Muscle weakness (generalized) (M62.81);Other abnormalities of gait and mobility (R26.89);Other symptoms and signs involving cognitive function;Pain                Time: 0354-6568 OT Time Calculation (min): 37 min Charges:  OT General Charges $OT Visit: 1 Visit OT Evaluation $OT Eval Moderate Complexity: 1 Mod  Spenser Harren K, OTD, OTR/L SecureChat Preferred Acute Rehab [phone]  Yvonnie Schinke K Koonce 09/11/2022, 2:56 PM

## 2022-09-11 NOTE — Evaluation (Signed)
Physical Therapy Evaluation Patient Details Name: [name] MRN: [id number] DOB: 24-Aug-1941 Today's Date: 09/11/2022  History of Present Illness  81 yo male with recent L hip ORIF WBAT was readmitted on 12/17 for respiratory failure with hypoxia, now dx with flu A.  Had incident with rapid response on 12/18 in HD with drop in BP and a-fib, tx was stopped.  PMHx:  advanced dementia, DM, HTN, hypothyroidism, ESRD on HD, lung nodule, PVD, CAD, suspected seizure  with L side weakness and speech changes, L hip fracture  Clinical Impression  Pt was seen for progression to side of bed and to stand, with two person mod assist for all mobility and pt tending to resist in all efforts.  He is both distracted by discomfort of L hip pain and with issues of cognition, and will need two person help for the next set of time in rehab to safely move him.  Follow acutely but anticipate his discharge to local SNF soon, possibly today.  Follow acutely for goals of PT to see if pt is potentially able to return home with family by achieving safety with gait.  Wife is in attendance for session so she can observe and help to see where he is in recovering from previous injury.       Recommendations for follow up therapy are one component of a multi-disciplinary discharge planning process, led by the attending physician.  Recommendations may be updated based on patient status, additional functional criteria and insurance authorization.  Follow Up Recommendations Skilled nursing-short term rehab (<3 hours/day) Can patient physically be transported by private vehicle: No    Assistance Recommended at Discharge Frequent or constant Supervision/Assistance  Patient can return home with the following  Two people to help with walking and/or transfers;Two people to help with bathing/dressing/bathroom;Direct supervision/assist for medications management;Direct supervision/assist for financial management;Assist for  transportation;Help with stairs or ramp for entrance    Equipment Recommendations None recommended by PT  Recommendations for Other Services       Functional Status Assessment Patient has had a recent decline in their functional status and demonstrates the ability to make significant improvements in function in a reasonable and predictable amount of time.     Precautions / Restrictions Precautions Precautions: Fall Precaution Comments: recent falls with most recent resulting in L hip fracture Restrictions Weight Bearing Restrictions: Yes LLE Weight Bearing: Weight bearing as tolerated      Mobility  Bed Mobility Overal bed mobility: Needs Assistance Bed Mobility: Supine to Sit, Sit to Supine     Supine to sit: Max assist, +2 for safety/equipment, +2 for physical assistance Sit to supine: Max assist, +2 for safety/equipment, +2 for physical assistance   General bed mobility comments: pt is anxious about being in pain, esp with peri skin changes.  Requires help to manage reaching out by pt and calming him to get him to sequence and assist, not resist    Transfers Overall transfer level: Needs assistance Equipment used: Rolling walker (2 wheels), 2 person hand held assist Transfers: Sit to/from Stand Sit to Stand: Mod assist, +2 physical assistance, +2 safety/equipment           General transfer comment: requires stability on feet to avoid sliding due to his attention to task and pain on LE    Ambulation/Gait               General Gait Details: unable  Stairs  Wheelchair Mobility    Modified Rankin (Stroke Patients Only)       Balance Overall balance assessment: History of Falls, Needs assistance Sitting-balance support: Feet supported, Bilateral upper extremity supported Sitting balance-Leahy Scale: Poor Sitting balance - Comments: requires contact and cues for safety and will suddenly lean to side to get off L hip Postural control:  Posterior lean, Right lateral lean Standing balance support: Bilateral upper extremity supported, During functional activity Standing balance-Leahy Scale: Poor Standing balance comment: poor wtih assist and cues for hand placement on walker                             Pertinent Vitals/Pain Pain Assessment Pain Assessment: Faces Faces Pain Scale: Hurts even more Pain Location: L hip and peri area Pain Descriptors / Indicators: Guarding, Grimacing Pain Intervention(s): Limited activity within patient's tolerance, Monitored during session, Repositioned    Home Living Family/patient expects to be discharged to:: Skilled nursing facility Living Arrangements: Spouse/significant other Available Help at Discharge: Family;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 hours/day Type of Home: House Home Access: Stairs to enter Entrance Stairs-Rails: Right;Left;Can reach both Entrance Stairs-Number of Steps: stair lift in the garage   Home Layout: Two level;Able to live on main level with bedroom/bathroom Home Equipment: Rolling Walker (2 wheels);Hospital bed;Transport chair;Wheelchair - manual;Grab bars - toilet      Prior Function Prior Level of Function : History of Falls (last six months)             Mobility Comments: prior level was RW at home but then was in rehab just prior to this for L hip fracture       Hand Dominance   Dominant Hand: Right    Extremity/Trunk Assessment   Upper Extremity Assessment Upper Extremity Assessment: Defer to OT evaluation    Lower Extremity Assessment Lower Extremity Assessment: Generalized weakness;LLE deficits/detail LLE Deficits / Details: requires minor assist to move LLE and does not tolerate resistance LLE: Unable to fully assess due to pain LLE Coordination: decreased gross motor    Cervical / Trunk Assessment Cervical / Trunk Assessment: Other exceptions;Kyphotic (mild change in upper back)  Communication   Communication: HOH;Other (comment)  (advanced dementia)  Cognition Arousal/Alertness: Awake/alert Behavior During Therapy: Anxious, Agitated Overall Cognitive Status: History of cognitive impairments - at baseline                                 General Comments: dementia but is in a good frame of mind to work with him with extra time        General Comments General comments (skin integrity, edema, etc.): Pt was assisted to side of bed with two assist then to stand with help to get cleaned up at walker from bowel movement but then was unable to sidestep    Exercises     Assessment/Plan    PT Assessment Patient needs continued PT services  PT Problem List Decreased strength;Decreased range of motion;Decreased activity tolerance;Decreased balance;Decreased coordination;Decreased mobility;Decreased cognition;Decreased knowledge of use of DME;Decreased safety awareness;Decreased skin integrity;Pain       PT Treatment Interventions DME instruction;Gait training;Functional mobility training;Therapeutic activities;Therapeutic exercise;Stair training;Balance training;Neuromuscular re-education;Patient/family education    PT Goals (Current goals can be found in the Care Plan section)  Acute Rehab PT Goals Patient Stated Goal: none stated PT Goal Formulation: With family Time For Goal Achievement: 09/18/22 Potential to Achieve Goals: Fair  Frequency Min 2X/week     Co-evaluation               AM-PAC PT "6 Clicks" Mobility  Outcome Measure Help needed turning from your back to your side while in a flat bed without using bedrails?: Total Help needed moving from lying on your back to sitting on the side of a flat bed without using bedrails?: Total Help needed moving to and from a bed to a chair (including a wheelchair)?: Total Help needed standing up from a chair using your arms (e.g., wheelchair or bedside chair)?: Total Help needed to walk in hospital room?: Total Help needed climbing 3-5 steps  with a railing? : Total 6 Click Score: 6    End of Session Equipment Utilized During Treatment: Gait belt Activity Tolerance: Treatment limited secondary to agitation;Patient limited by pain;Other (comment) Patient left: in bed;with call bell/phone within reach;with bed alarm set;with family/visitor present Nurse Communication: Mobility status PT Visit Diagnosis: Unsteadiness on feet (R26.81);Muscle weakness (generalized) (M62.81);History of falling (Z91.81);Difficulty in walking, not elsewhere classified (R26.2);Pain Pain - Right/Left: Left Pain - part of body: Hip    Time: 4818-5909 PT Time Calculation (min) (ACUTE ONLY): 37 min   Charges:   PT Evaluation $PT Eval Moderate Complexity: 1 Mod         Ramond Dial 09/11/2022, 2:47 PM  Mee Hives, PT PhD Acute Rehab Dept. Number: [place] [facility] [place]

## 2022-09-12 ENCOUNTER — Other Ambulatory Visit: Payer: Self-pay | Admitting: *Deleted

## 2022-09-12 DIAGNOSIS — N400 Enlarged prostate without lower urinary tract symptoms: Secondary | ICD-10-CM | POA: Diagnosis not present

## 2022-09-12 DIAGNOSIS — N186 End stage renal disease: Secondary | ICD-10-CM | POA: Diagnosis not present

## 2022-09-12 DIAGNOSIS — M6281 Muscle weakness (generalized): Secondary | ICD-10-CM | POA: Diagnosis not present

## 2022-09-12 DIAGNOSIS — S72145D Nondisplaced intertrochanteric fracture of left femur, subsequent encounter for closed fracture with routine healing: Secondary | ICD-10-CM | POA: Diagnosis not present

## 2022-09-12 DIAGNOSIS — E213 Hyperparathyroidism, unspecified: Secondary | ICD-10-CM | POA: Diagnosis not present

## 2022-09-12 DIAGNOSIS — S72002D Fracture of unspecified part of neck of left femur, subsequent encounter for closed fracture with routine healing: Secondary | ICD-10-CM | POA: Diagnosis not present

## 2022-09-12 DIAGNOSIS — R41841 Cognitive communication deficit: Secondary | ICD-10-CM | POA: Diagnosis not present

## 2022-09-12 DIAGNOSIS — E440 Moderate protein-calorie malnutrition: Secondary | ICD-10-CM | POA: Diagnosis not present

## 2022-09-12 DIAGNOSIS — E118 Type 2 diabetes mellitus with unspecified complications: Secondary | ICD-10-CM | POA: Diagnosis not present

## 2022-09-12 DIAGNOSIS — R2681 Unsteadiness on feet: Secondary | ICD-10-CM | POA: Diagnosis not present

## 2022-09-12 DIAGNOSIS — E039 Hypothyroidism, unspecified: Secondary | ICD-10-CM | POA: Diagnosis not present

## 2022-09-12 DIAGNOSIS — Z4789 Encounter for other orthopedic aftercare: Secondary | ICD-10-CM | POA: Diagnosis not present

## 2022-09-12 NOTE — Patient Outreach (Addendum)
Mr. Holzman currently resides in [facility].  Earlier today, secure communication sent to SNF social worker team to request update on transition plans/needs.   Will continue to follow.   Marthenia Rolling, MSN, RN,BSN [place] Acute Care Coordinator [phone] (Direct dial)

## 2022-09-13 DIAGNOSIS — N2581 Secondary hyperparathyroidism of renal origin: Secondary | ICD-10-CM | POA: Diagnosis not present

## 2022-09-13 DIAGNOSIS — T782XXA Anaphylactic shock, unspecified, initial encounter: Secondary | ICD-10-CM | POA: Diagnosis not present

## 2022-09-13 DIAGNOSIS — E876 Hypokalemia: Secondary | ICD-10-CM | POA: Diagnosis not present

## 2022-09-13 DIAGNOSIS — D631 Anemia in chronic kidney disease: Secondary | ICD-10-CM | POA: Diagnosis not present

## 2022-09-13 DIAGNOSIS — Z992 Dependence on renal dialysis: Secondary | ICD-10-CM | POA: Diagnosis not present

## 2022-09-13 DIAGNOSIS — N186 End stage renal disease: Secondary | ICD-10-CM | POA: Diagnosis not present

## 2022-09-13 DIAGNOSIS — J09X2 Influenza due to identified novel influenza A virus with other respiratory manifestations: Secondary | ICD-10-CM | POA: Diagnosis not present

## 2022-09-14 DIAGNOSIS — N2581 Secondary hyperparathyroidism of renal origin: Secondary | ICD-10-CM | POA: Diagnosis not present

## 2022-09-14 DIAGNOSIS — N186 End stage renal disease: Secondary | ICD-10-CM | POA: Diagnosis not present

## 2022-09-14 DIAGNOSIS — T782XXA Anaphylactic shock, unspecified, initial encounter: Secondary | ICD-10-CM | POA: Diagnosis not present

## 2022-09-14 DIAGNOSIS — E876 Hypokalemia: Secondary | ICD-10-CM | POA: Diagnosis not present

## 2022-09-14 DIAGNOSIS — Z992 Dependence on renal dialysis: Secondary | ICD-10-CM | POA: Diagnosis not present

## 2022-09-14 DIAGNOSIS — D631 Anemia in chronic kidney disease: Secondary | ICD-10-CM | POA: Diagnosis not present

## 2022-09-14 LAB — CULTURE, BLOOD (ROUTINE X 2)
Culture: NO GROWTH
Culture: NO GROWTH
Special Requests: ADEQUATE
Special Requests: ADEQUATE

## 2022-09-16 DIAGNOSIS — N186 End stage renal disease: Secondary | ICD-10-CM | POA: Diagnosis not present

## 2022-09-16 DIAGNOSIS — E876 Hypokalemia: Secondary | ICD-10-CM | POA: Diagnosis not present

## 2022-09-16 DIAGNOSIS — D631 Anemia in chronic kidney disease: Secondary | ICD-10-CM | POA: Diagnosis not present

## 2022-09-16 DIAGNOSIS — Z992 Dependence on renal dialysis: Secondary | ICD-10-CM | POA: Diagnosis not present

## 2022-09-16 DIAGNOSIS — N2581 Secondary hyperparathyroidism of renal origin: Secondary | ICD-10-CM | POA: Diagnosis not present

## 2022-09-16 DIAGNOSIS — T782XXA Anaphylactic shock, unspecified, initial encounter: Secondary | ICD-10-CM | POA: Diagnosis not present

## 2022-09-18 ENCOUNTER — Institutional Professional Consult (permissible substitution): Payer: Medicare Other | Admitting: Neurology

## 2022-09-18 DIAGNOSIS — L89152 Pressure ulcer of sacral region, stage 2: Secondary | ICD-10-CM | POA: Diagnosis not present

## 2022-09-18 DIAGNOSIS — F0390 Unspecified dementia without behavioral disturbance: Secondary | ICD-10-CM | POA: Diagnosis not present

## 2022-09-18 DIAGNOSIS — K5730 Diverticulosis of large intestine without perforation or abscess without bleeding: Secondary | ICD-10-CM | POA: Diagnosis not present

## 2022-09-18 DIAGNOSIS — I10 Essential (primary) hypertension: Secondary | ICD-10-CM | POA: Diagnosis not present

## 2022-09-19 DIAGNOSIS — T782XXA Anaphylactic shock, unspecified, initial encounter: Secondary | ICD-10-CM | POA: Diagnosis not present

## 2022-09-19 DIAGNOSIS — D631 Anemia in chronic kidney disease: Secondary | ICD-10-CM | POA: Diagnosis not present

## 2022-09-19 DIAGNOSIS — N2581 Secondary hyperparathyroidism of renal origin: Secondary | ICD-10-CM | POA: Diagnosis not present

## 2022-09-19 DIAGNOSIS — Z992 Dependence on renal dialysis: Secondary | ICD-10-CM | POA: Diagnosis not present

## 2022-09-19 DIAGNOSIS — E876 Hypokalemia: Secondary | ICD-10-CM | POA: Diagnosis not present

## 2022-09-19 DIAGNOSIS — L89152 Pressure ulcer of sacral region, stage 2: Secondary | ICD-10-CM | POA: Diagnosis not present

## 2022-09-19 DIAGNOSIS — N186 End stage renal disease: Secondary | ICD-10-CM | POA: Diagnosis not present

## 2022-09-21 ENCOUNTER — Encounter: Payer: Self-pay | Admitting: *Deleted

## 2022-09-21 ENCOUNTER — Telehealth: Payer: Self-pay | Admitting: *Deleted

## 2022-09-21 DIAGNOSIS — D631 Anemia in chronic kidney disease: Secondary | ICD-10-CM | POA: Diagnosis not present

## 2022-09-21 DIAGNOSIS — N2581 Secondary hyperparathyroidism of renal origin: Secondary | ICD-10-CM | POA: Diagnosis not present

## 2022-09-21 DIAGNOSIS — N186 End stage renal disease: Secondary | ICD-10-CM | POA: Diagnosis not present

## 2022-09-21 DIAGNOSIS — Z992 Dependence on renal dialysis: Secondary | ICD-10-CM | POA: Diagnosis not present

## 2022-09-21 DIAGNOSIS — L89152 Pressure ulcer of sacral region, stage 2: Secondary | ICD-10-CM | POA: Diagnosis not present

## 2022-09-21 DIAGNOSIS — T782XXA Anaphylactic shock, unspecified, initial encounter: Secondary | ICD-10-CM | POA: Diagnosis not present

## 2022-09-21 DIAGNOSIS — E876 Hypokalemia: Secondary | ICD-10-CM | POA: Diagnosis not present

## 2022-09-21 DIAGNOSIS — E039 Hypothyroidism, unspecified: Secondary | ICD-10-CM | POA: Diagnosis not present

## 2022-09-21 DIAGNOSIS — I10 Essential (primary) hypertension: Secondary | ICD-10-CM | POA: Diagnosis not present

## 2022-09-21 NOTE — Progress Notes (Signed)
  Care Coordination   Note   09/21/2022 Name: [name] MRN: [id number] DOB: [birth year]  [name] is a 81 y.o. year old male who sees Baxley, Cresenciano Lick, MD for primary care. I reached out to [name] by phone today to offer care coordination services.  Mr. Laban spouse [name] was given information about Care Coordination services today including:   The Care Coordination services include support from the care team which includes your Nurse Coordinator, Clinical Social Worker, or Pharmacist.  The Care Coordination team is here to help remove barriers to the health concerns and goals most important to you. Care Coordination services are voluntary, and the patient may decline or stop services at any time by request to their care team member.   Care Coordination Consent Status: Patient spouse [name] agreed to services and verbal consent obtained.   Follow up plan:  Telephone appointment with care coordination team member scheduled for:  09/25/22  Encounter Outcome:  Pt. Scheduled  [place]  Direct Dial: [phone]

## 2022-09-23 DIAGNOSIS — N2581 Secondary hyperparathyroidism of renal origin: Secondary | ICD-10-CM | POA: Diagnosis not present

## 2022-09-23 DIAGNOSIS — E876 Hypokalemia: Secondary | ICD-10-CM | POA: Diagnosis not present

## 2022-09-23 DIAGNOSIS — Z992 Dependence on renal dialysis: Secondary | ICD-10-CM | POA: Diagnosis not present

## 2022-09-23 DIAGNOSIS — D631 Anemia in chronic kidney disease: Secondary | ICD-10-CM | POA: Diagnosis not present

## 2022-09-23 DIAGNOSIS — N186 End stage renal disease: Secondary | ICD-10-CM | POA: Diagnosis not present

## 2022-09-23 DIAGNOSIS — T782XXA Anaphylactic shock, unspecified, initial encounter: Secondary | ICD-10-CM | POA: Diagnosis not present

## 2022-09-24 DIAGNOSIS — Z992 Dependence on renal dialysis: Secondary | ICD-10-CM | POA: Diagnosis not present

## 2022-09-24 DIAGNOSIS — N186 End stage renal disease: Secondary | ICD-10-CM | POA: Diagnosis not present

## 2022-09-24 DIAGNOSIS — E1122 Type 2 diabetes mellitus with diabetic chronic kidney disease: Secondary | ICD-10-CM | POA: Diagnosis not present

## 2022-09-25 ENCOUNTER — Ambulatory Visit: Payer: Self-pay | Admitting: Licensed Clinical Social Worker

## 2022-09-25 ENCOUNTER — Other Ambulatory Visit: Payer: Self-pay | Admitting: *Deleted

## 2022-09-25 DIAGNOSIS — J1089 Influenza due to other identified influenza virus with other manifestations: Secondary | ICD-10-CM | POA: Diagnosis not present

## 2022-09-25 NOTE — Patient Outreach (Signed)
  Care Coordination   Initial Visit Note   09/25/2022 Name: [name] MRN: [id number] DOB: 06-Apr-1941  [name] is a 82 y.o. year old male who sees [name], Johnny Lick, MD for primary care. I spoke with  [name]'s spouse, [name], by phone today.  What matters to the patients health and wellness today?  Caregiver fatigue/LTC placement    Goals Addressed             This Visit's Progress    Caregiver Fatigue-LCSW   On track    Care Coordination Interventions: Solution-Focused Strategies employed:  Active listening / Reflection utilized  Emotional Support Provided Caregiver stress acknowledged  Verbalization of feelings encouraged  LCSW spoke with spouse, [name], who is currently at the facility  PT is speaking to her about transferring recommendations. Per spouse, the facility is contemplating d/c on Saturday, January  6,24 due to making limited progress in therapy Spouse is concerned that pt has been sick in the last two days. She encouraged LCSW to speak with SW [name] [phone] about d/c plans LCSW left message requesting a return call for SW at 11:05 AM LCSW collaboratd with Johnny Rolling, RN via secure chat. Facility informed her on12/24 that pt was "connected with [facility] and have located facilities interested in transitioning to upon discharge" Continued collaboration with one another to best support pt and spouse             SDOH assessments and interventions completed:  No     Care Coordination Interventions:  Yes, provided   Follow up plan: Follow up call scheduled for within a week    Encounter Outcome:  Pt. Visit Completed   [name], MSW, [email or web address] Phone [phone] 11:25 AM

## 2022-09-25 NOTE — Patient Outreach (Signed)
[facility] Coordinator follow up. Mr. Kurtenbach resides in [facility].  Collaboration with [place] Social Worker, [place], about Mr. Cleckler's transition plans and date from [facility] skilled nursing facility.   Earlier today, secure communication sent and voicemail message left for [facility] skilled nursing facility social work team to inquire about transition plans/date.   Mr. Lapaglia has VA benefits.  [place] notes indicate authorization for long term care approval via VA benefits would have to be initiated by skilled nursing facility staff.   Telephone call made to Mrs. Steeves to inquire about transition plans. Mrs. Leverich endorses she will pay privately for short term at [facility] until further arrangements are made for placement. States she has been working with [facility] to find a facility post [place]. Mrs. Mcconaha endorses the plan is not for home at this time. States she has also spoke with [place] about information received from the [place]. Mrs. Lindroth reports Mr. Strollo is 100% disable veteran.  Writer also confirmed with [facility]'s Admissions and corporate office that [place] benefits can not be utilized at this time due to Mr. Wilczynski being under Medicare Part A and 100 days have not been used. Corporate office plans to discuss further options with Mrs. Montano if the [facility] does not materialize post skilled stay.   Will continue to follow while Mr. Fulghum resides in [facility] skilled nursing facility. Will continue to keep [facility] social worker updated.   Marthenia Rolling, MSN, RN,BSN [place] Acute Care Coordinator [phone] (Direct dial)

## 2022-09-25 NOTE — Patient Instructions (Signed)
Visit Information  Thank you for taking time to visit with me today. Please don't hesitate to contact me if I can be of assistance to you.   Following are the goals we discussed today:   Goals Addressed             This Visit's Progress    Caregiver Fatigue-LCSW   On track    Care Coordination Interventions: Solution-Focused Strategies employed:  Active listening / Reflection utilized  Emotional Support Provided Caregiver stress acknowledged  Verbalization of feelings encouraged  LCSW spoke with spouse, [name], who is currently at the facility  PT is speaking to her about transferring recommendations. Per spouse, the facility is contemplating d/c on Saturday, January  6,24 due to making limited progress in therapy Spouse is concerned that pt has been sick in the last two days. She encouraged LCSW to speak with SW [place] [phone] about d/c plans LCSW left message requesting a return call for SW at 11:05 AM LCSW collaboratd with Marthenia Rolling, RN via secure chat. Facility informed her on12/24 that pt was "connected with [facility] and have located facilities interested in transitioning to upon discharge" Continued collaboration with one another to best support pt and spouse            Please call the care guide team at [phone] if you need to cancel or reschedule your appointment.   If you are experiencing a Mental Health or [place] or need someone to talk to, please call the Suicide and Crisis Lifeline: 988 call 911   Patient verbalizes understanding of instructions and care plan provided today and agrees to view in [place]. Active MyChart status and patient understanding of how to access instructions and care plan via MyChart confirmed with patient.     Christa See, MSW, [email or web address] Phone [phone] 11:26 AM

## 2022-09-26 DIAGNOSIS — R2681 Unsteadiness on feet: Secondary | ICD-10-CM | POA: Diagnosis not present

## 2022-09-26 DIAGNOSIS — E039 Hypothyroidism, unspecified: Secondary | ICD-10-CM | POA: Diagnosis not present

## 2022-09-26 DIAGNOSIS — N2581 Secondary hyperparathyroidism of renal origin: Secondary | ICD-10-CM | POA: Diagnosis not present

## 2022-09-26 DIAGNOSIS — E440 Moderate protein-calorie malnutrition: Secondary | ICD-10-CM | POA: Diagnosis not present

## 2022-09-26 DIAGNOSIS — S72002D Fracture of unspecified part of neck of left femur, subsequent encounter for closed fracture with routine healing: Secondary | ICD-10-CM | POA: Diagnosis not present

## 2022-09-26 DIAGNOSIS — M6281 Muscle weakness (generalized): Secondary | ICD-10-CM | POA: Diagnosis not present

## 2022-09-26 DIAGNOSIS — E118 Type 2 diabetes mellitus with unspecified complications: Secondary | ICD-10-CM | POA: Diagnosis not present

## 2022-09-26 DIAGNOSIS — N186 End stage renal disease: Secondary | ICD-10-CM | POA: Diagnosis not present

## 2022-09-26 DIAGNOSIS — E213 Hyperparathyroidism, unspecified: Secondary | ICD-10-CM | POA: Diagnosis not present

## 2022-09-26 DIAGNOSIS — R41841 Cognitive communication deficit: Secondary | ICD-10-CM | POA: Diagnosis not present

## 2022-09-26 DIAGNOSIS — Z4789 Encounter for other orthopedic aftercare: Secondary | ICD-10-CM | POA: Diagnosis not present

## 2022-09-26 DIAGNOSIS — Z992 Dependence on renal dialysis: Secondary | ICD-10-CM | POA: Diagnosis not present

## 2022-09-26 DIAGNOSIS — S72145D Nondisplaced intertrochanteric fracture of left femur, subsequent encounter for closed fracture with routine healing: Secondary | ICD-10-CM | POA: Diagnosis not present

## 2022-09-26 DIAGNOSIS — N400 Enlarged prostate without lower urinary tract symptoms: Secondary | ICD-10-CM | POA: Diagnosis not present

## 2022-09-27 DIAGNOSIS — F32A Depression, unspecified: Secondary | ICD-10-CM | POA: Diagnosis not present

## 2022-09-27 DIAGNOSIS — F03C0 Unspecified dementia, severe, without behavioral disturbance, psychotic disturbance, mood disturbance, and anxiety: Secondary | ICD-10-CM | POA: Diagnosis not present

## 2022-09-28 ENCOUNTER — Telehealth: Payer: Self-pay | Admitting: Internal Medicine

## 2022-09-28 DIAGNOSIS — Z634 Disappearance and death of family member: Secondary | ICD-10-CM | POA: Diagnosis not present

## 2022-10-03 ENCOUNTER — Other Ambulatory Visit: Payer: Self-pay | Admitting: *Deleted

## 2022-10-03 NOTE — Patient Outreach (Signed)
[facility] Coordinator follow up. Verified in [facility] Mr. Delpizzo expired at [facility] on 10/05/2022.  Will alert [facility] care coordination team.   Marthenia Rolling, MSN, RN,BSN [place] Acute Care Coordinator [phone] (Direct dial)

## 2022-10-25 NOTE — Telephone Encounter (Signed)
Patients wife called to let Dr Renold Genta know that her husband had passed away today 02-Oct-2022

## 2022-10-25 DEATH — deceased

## 2022-10-30 ENCOUNTER — Other Ambulatory Visit: Payer: TRICARE For Life (TFL)

## 2022-10-30 ENCOUNTER — Institutional Professional Consult (permissible substitution): Payer: Medicare Other | Admitting: Neurology

## 2022-11-01 ENCOUNTER — Ambulatory Visit: Payer: TRICARE For Life (TFL) | Admitting: Endocrinology

## 2023-05-28 ENCOUNTER — Other Ambulatory Visit: Payer: TRICARE For Life (TFL)

## 2023-05-30 ENCOUNTER — Ambulatory Visit: Payer: TRICARE For Life (TFL) | Admitting: Internal Medicine
